# Patient Record
Sex: Female | Born: 1955
Health system: Southern US, Community
[De-identification: ages and names within clinical notes are randomized; demographics above are authoritative.]

## PROBLEM LIST (undated history)

## (undated) DIAGNOSIS — N390 Urinary tract infection, site not specified: Secondary | ICD-10-CM

## (undated) DIAGNOSIS — E039 Hypothyroidism, unspecified: Secondary | ICD-10-CM

## (undated) DIAGNOSIS — T462X1A Poisoning by other antidysrhythmic drugs, accidental (unintentional), initial encounter: Secondary | ICD-10-CM

## (undated) DIAGNOSIS — G9340 Encephalopathy, unspecified: Secondary | ICD-10-CM

## (undated) DIAGNOSIS — F419 Anxiety disorder, unspecified: Secondary | ICD-10-CM

## (undated) DIAGNOSIS — R188 Other ascites: Secondary | ICD-10-CM

## (undated) DIAGNOSIS — R55 Syncope and collapse: Secondary | ICD-10-CM

## (undated) DIAGNOSIS — K7460 Unspecified cirrhosis of liver: Secondary | ICD-10-CM

## (undated) DIAGNOSIS — E109 Type 1 diabetes mellitus without complications: Secondary | ICD-10-CM

## (undated) DIAGNOSIS — A0472 Enterocolitis due to Clostridium difficile, not specified as recurrent: Secondary | ICD-10-CM

## (undated) DIAGNOSIS — N201 Calculus of ureter: Secondary | ICD-10-CM

## (undated) DIAGNOSIS — I209 Angina pectoris, unspecified: Secondary | ICD-10-CM

## (undated) DIAGNOSIS — K567 Ileus, unspecified: Secondary | ICD-10-CM

## (undated) DIAGNOSIS — Z8719 Personal history of other diseases of the digestive system: Secondary | ICD-10-CM

## (undated) DIAGNOSIS — K561 Intussusception: Secondary | ICD-10-CM

## (undated) DIAGNOSIS — E032 Hypothyroidism due to medicaments and other exogenous substances: Secondary | ICD-10-CM

## (undated) DIAGNOSIS — I495 Sick sinus syndrome: Secondary | ICD-10-CM

## (undated) DIAGNOSIS — I480 Paroxysmal atrial fibrillation: Secondary | ICD-10-CM

## (undated) DIAGNOSIS — T7840XA Allergy, unspecified, initial encounter: Secondary | ICD-10-CM

## (undated) DIAGNOSIS — Z8542 Personal history of malignant neoplasm of other parts of uterus: Secondary | ICD-10-CM

## (undated) DIAGNOSIS — I10 Essential (primary) hypertension: Secondary | ICD-10-CM

## (undated) DIAGNOSIS — J189 Pneumonia, unspecified organism: Secondary | ICD-10-CM

## (undated) DIAGNOSIS — N189 Chronic kidney disease, unspecified: Secondary | ICD-10-CM

## (undated) DIAGNOSIS — A419 Sepsis, unspecified organism: Secondary | ICD-10-CM

## (undated) DIAGNOSIS — K219 Gastro-esophageal reflux disease without esophagitis: Secondary | ICD-10-CM

## (undated) DIAGNOSIS — G459 Transient cerebral ischemic attack, unspecified: Secondary | ICD-10-CM

## (undated) DIAGNOSIS — K259 Gastric ulcer, unspecified as acute or chronic, without hemorrhage or perforation: Secondary | ICD-10-CM

## (undated) DIAGNOSIS — I639 Cerebral infarction, unspecified: Secondary | ICD-10-CM

## (undated) DIAGNOSIS — IMO0002 Reserved for concepts with insufficient information to code with codable children: Secondary | ICD-10-CM

## (undated) DIAGNOSIS — K859 Acute pancreatitis without necrosis or infection, unspecified: Secondary | ICD-10-CM

## (undated) DIAGNOSIS — K3184 Gastroparesis: Secondary | ICD-10-CM

## (undated) DIAGNOSIS — C801 Malignant (primary) neoplasm, unspecified: Secondary | ICD-10-CM

## (undated) DIAGNOSIS — K5792 Diverticulitis of intestine, part unspecified, without perforation or abscess without bleeding: Secondary | ICD-10-CM

## (undated) DIAGNOSIS — I951 Orthostatic hypotension: Secondary | ICD-10-CM

## (undated) DIAGNOSIS — R000 Tachycardia, unspecified: Secondary | ICD-10-CM

## (undated) HISTORY — DX: Enterocolitis due to Clostridium difficile, not specified as recurrent: A04.72

## (undated) HISTORY — DX: Paroxysmal atrial fibrillation: I48.0

## (undated) HISTORY — PX: ABDOMINAL HYSTERECTOMY: SHX81

## (undated) HISTORY — DX: Chronic kidney disease, unspecified: N18.9

## (undated) HISTORY — DX: Encephalopathy, unspecified: G93.40

## (undated) HISTORY — DX: Calculus of ureter: N20.1

## (undated) HISTORY — DX: Urinary tract infection, site not specified: N39.0

## (undated) HISTORY — DX: Orthostatic hypotension: I95.1

## (undated) HISTORY — DX: Pneumonia, unspecified organism: J18.9

## (undated) HISTORY — DX: Poisoning by other antidysrhythmic drugs, accidental (unintentional), initial encounter: T46.2X1A

## (undated) HISTORY — DX: Ileus, unspecified: K56.7

## (undated) HISTORY — PX: CHOLECYSTECTOMY: SHX55

## (undated) HISTORY — DX: Personal history of malignant neoplasm of other parts of uterus: Z85.42

## (undated) HISTORY — DX: Poisoning by other antidysrhythmic drugs, accidental (unintentional), initial encounter: E03.2

## (undated) HISTORY — DX: Other ascites: R18.8

## (undated) HISTORY — DX: Allergy, unspecified, initial encounter: T78.40XA

## (undated) HISTORY — DX: Unspecified cirrhosis of liver: K74.60

## (undated) HISTORY — DX: Tachycardia, unspecified: R00.0

## (undated) HISTORY — PX: HERNIA REPAIR: SHX51

---
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
## 2003-12-10 ENCOUNTER — Encounter (INDEPENDENT_AMBULATORY_CARE_PROVIDER_SITE_OTHER): Payer: Self-pay | Admitting: Internal Medicine

## 2003-12-17 ENCOUNTER — Other Ambulatory Visit: Admission: RE | Admit: 2003-12-17 | Discharge: 2003-12-17 | Payer: Self-pay | Admitting: Family Medicine

## 2003-12-21 ENCOUNTER — Encounter: Admission: RE | Admit: 2003-12-21 | Discharge: 2003-12-21 | Payer: Self-pay | Admitting: *Deleted

## 2003-12-28 ENCOUNTER — Encounter: Admission: RE | Admit: 2003-12-28 | Discharge: 2003-12-28 | Payer: Self-pay | Admitting: Family Medicine

## 2004-02-09 ENCOUNTER — Encounter (INDEPENDENT_AMBULATORY_CARE_PROVIDER_SITE_OTHER): Payer: Self-pay | Admitting: Internal Medicine

## 2004-02-15 ENCOUNTER — Encounter: Admission: RE | Admit: 2004-02-15 | Discharge: 2004-02-15 | Payer: Self-pay | Admitting: Family Medicine

## 2004-02-21 ENCOUNTER — Encounter
Admission: RE | Admit: 2004-02-21 | Discharge: 2004-05-21 | Payer: Self-pay | Admitting: Physical Medicine & Rehabilitation

## 2004-05-30 ENCOUNTER — Encounter
Admission: RE | Admit: 2004-05-30 | Discharge: 2004-08-28 | Payer: Self-pay | Admitting: Physical Medicine & Rehabilitation

## 2004-07-11 ENCOUNTER — Encounter (INDEPENDENT_AMBULATORY_CARE_PROVIDER_SITE_OTHER): Payer: Self-pay | Admitting: Internal Medicine

## 2004-07-11 LAB — CONVERTED CEMR LAB: Hgb A1c MFr Bld: 5.8 %

## 2004-07-18 ENCOUNTER — Ambulatory Visit: Payer: Self-pay | Admitting: Family Medicine

## 2004-07-19 ENCOUNTER — Ambulatory Visit: Payer: Self-pay | Admitting: Physical Medicine & Rehabilitation

## 2004-07-24 ENCOUNTER — Ambulatory Visit: Payer: Self-pay | Admitting: Physical Medicine & Rehabilitation

## 2004-08-15 ENCOUNTER — Encounter: Admission: RE | Admit: 2004-08-15 | Discharge: 2004-08-15 | Payer: Self-pay | Admitting: Family Medicine

## 2004-09-12 ENCOUNTER — Encounter
Admission: RE | Admit: 2004-09-12 | Discharge: 2004-12-11 | Payer: Self-pay | Admitting: Physical Medicine & Rehabilitation

## 2004-09-14 ENCOUNTER — Ambulatory Visit: Payer: Self-pay | Admitting: Physical Medicine & Rehabilitation

## 2004-10-31 ENCOUNTER — Ambulatory Visit: Payer: Self-pay | Admitting: Physical Medicine & Rehabilitation

## 2004-10-31 ENCOUNTER — Ambulatory Visit: Payer: Self-pay | Admitting: Anesthesiology

## 2004-11-22 ENCOUNTER — Ambulatory Visit: Payer: Self-pay | Admitting: Physical Medicine & Rehabilitation

## 2004-12-05 ENCOUNTER — Emergency Department (HOSPITAL_COMMUNITY): Admission: EM | Admit: 2004-12-05 | Discharge: 2004-12-05 | Payer: Self-pay | Admitting: Emergency Medicine

## 2004-12-09 ENCOUNTER — Encounter (INDEPENDENT_AMBULATORY_CARE_PROVIDER_SITE_OTHER): Payer: Self-pay | Admitting: Internal Medicine

## 2004-12-09 LAB — CONVERTED CEMR LAB: Hgb A1c MFr Bld: 6.7 %

## 2004-12-21 ENCOUNTER — Encounter
Admission: RE | Admit: 2004-12-21 | Discharge: 2005-03-21 | Payer: Self-pay | Admitting: Physical Medicine & Rehabilitation

## 2004-12-27 ENCOUNTER — Emergency Department (HOSPITAL_COMMUNITY): Admission: EM | Admit: 2004-12-27 | Discharge: 2004-12-28 | Payer: Self-pay | Admitting: Emergency Medicine

## 2004-12-31 ENCOUNTER — Emergency Department (HOSPITAL_COMMUNITY): Admission: EM | Admit: 2004-12-31 | Discharge: 2004-12-31 | Payer: Self-pay | Admitting: Emergency Medicine

## 2005-01-02 ENCOUNTER — Ambulatory Visit: Payer: Self-pay | Admitting: Family Medicine

## 2005-01-08 ENCOUNTER — Emergency Department (HOSPITAL_COMMUNITY): Admission: EM | Admit: 2005-01-08 | Discharge: 2005-01-08 | Payer: Self-pay | Admitting: *Deleted

## 2005-01-22 ENCOUNTER — Ambulatory Visit: Payer: Self-pay | Admitting: Physical Medicine & Rehabilitation

## 2005-02-20 ENCOUNTER — Ambulatory Visit: Payer: Self-pay | Admitting: Family Medicine

## 2005-03-07 ENCOUNTER — Ambulatory Visit: Payer: Self-pay | Admitting: Physical Medicine & Rehabilitation

## 2005-04-05 ENCOUNTER — Inpatient Hospital Stay (HOSPITAL_COMMUNITY): Admission: EM | Admit: 2005-04-05 | Discharge: 2005-04-13 | Payer: Self-pay | Admitting: Emergency Medicine

## 2005-04-07 ENCOUNTER — Ambulatory Visit: Payer: Self-pay | Admitting: Internal Medicine

## 2005-04-14 ENCOUNTER — Inpatient Hospital Stay (HOSPITAL_COMMUNITY): Admission: EM | Admit: 2005-04-14 | Discharge: 2005-04-25 | Payer: Self-pay | Admitting: Emergency Medicine

## 2005-04-30 ENCOUNTER — Ambulatory Visit: Payer: Self-pay | Admitting: Family Medicine

## 2005-05-03 ENCOUNTER — Inpatient Hospital Stay (HOSPITAL_COMMUNITY): Admission: EM | Admit: 2005-05-03 | Discharge: 2005-05-15 | Payer: Self-pay | Admitting: *Deleted

## 2005-05-03 ENCOUNTER — Ambulatory Visit: Payer: Self-pay | Admitting: Internal Medicine

## 2005-05-15 ENCOUNTER — Ambulatory Visit: Payer: Self-pay | Admitting: Internal Medicine

## 2005-05-25 ENCOUNTER — Ambulatory Visit: Payer: Self-pay | Admitting: Family Medicine

## 2005-05-28 ENCOUNTER — Ambulatory Visit: Payer: Self-pay | Admitting: Family Medicine

## 2005-06-01 ENCOUNTER — Ambulatory Visit: Payer: Self-pay | Admitting: Internal Medicine

## 2005-06-08 ENCOUNTER — Ambulatory Visit: Payer: Self-pay | Admitting: Family Medicine

## 2005-06-11 ENCOUNTER — Ambulatory Visit: Payer: Self-pay | Admitting: Family Medicine

## 2005-06-13 ENCOUNTER — Encounter: Admission: RE | Admit: 2005-06-13 | Discharge: 2005-06-13 | Payer: Self-pay | Admitting: Family Medicine

## 2005-06-18 ENCOUNTER — Ambulatory Visit: Payer: Self-pay | Admitting: Family Medicine

## 2005-06-27 ENCOUNTER — Ambulatory Visit: Payer: Self-pay | Admitting: Family Medicine

## 2005-06-29 ENCOUNTER — Ambulatory Visit: Payer: Self-pay | Admitting: Internal Medicine

## 2005-07-18 ENCOUNTER — Ambulatory Visit: Payer: Self-pay | Admitting: Physical Medicine & Rehabilitation

## 2005-07-18 ENCOUNTER — Encounter
Admission: RE | Admit: 2005-07-18 | Discharge: 2005-10-16 | Payer: Self-pay | Admitting: Physical Medicine & Rehabilitation

## 2005-07-19 ENCOUNTER — Encounter (INDEPENDENT_AMBULATORY_CARE_PROVIDER_SITE_OTHER): Payer: Self-pay | Admitting: Internal Medicine

## 2005-09-05 ENCOUNTER — Inpatient Hospital Stay (HOSPITAL_COMMUNITY): Admission: EM | Admit: 2005-09-05 | Discharge: 2005-09-07 | Payer: Self-pay | Admitting: Emergency Medicine

## 2005-09-05 ENCOUNTER — Ambulatory Visit: Payer: Self-pay | Admitting: Internal Medicine

## 2005-09-06 ENCOUNTER — Ambulatory Visit: Payer: Self-pay | Admitting: Gastroenterology

## 2005-09-28 ENCOUNTER — Ambulatory Visit: Payer: Self-pay | Admitting: Physical Medicine & Rehabilitation

## 2005-09-28 ENCOUNTER — Ambulatory Visit: Payer: Self-pay | Admitting: Internal Medicine

## 2005-10-11 ENCOUNTER — Encounter (INDEPENDENT_AMBULATORY_CARE_PROVIDER_SITE_OTHER): Payer: Self-pay | Admitting: Internal Medicine

## 2005-10-11 LAB — CONVERTED CEMR LAB: Hgb A1c MFr Bld: 6.8 %

## 2005-10-25 ENCOUNTER — Encounter
Admission: RE | Admit: 2005-10-25 | Discharge: 2006-01-23 | Payer: Self-pay | Admitting: Physical Medicine & Rehabilitation

## 2005-11-02 ENCOUNTER — Ambulatory Visit: Payer: Self-pay | Admitting: Family Medicine

## 2005-11-08 ENCOUNTER — Ambulatory Visit: Payer: Self-pay | Admitting: Family Medicine

## 2005-11-09 ENCOUNTER — Ambulatory Visit: Payer: Self-pay | Admitting: Family Medicine

## 2005-11-13 IMAGING — CR DG LUMBAR SPINE 2-3V
3 series · 3 of 3 positions shown · non-contrast
Comparison: none

CLINICAL DATA: Back pain. 
 LUMBAR SPINE ? (TWO ? THREE VIEWS)
 There are five lumbar type vertebra present.  There is a mild scoliosis with the convexity of the scoliosis to the left in the mid lumbar region.  The interspaces are adequately maintained.  There are no fractures, subluxations, or destructive changes. 
 IMPRESSION
 Mild scoliosis.

[view not recorded (1 of 3)]
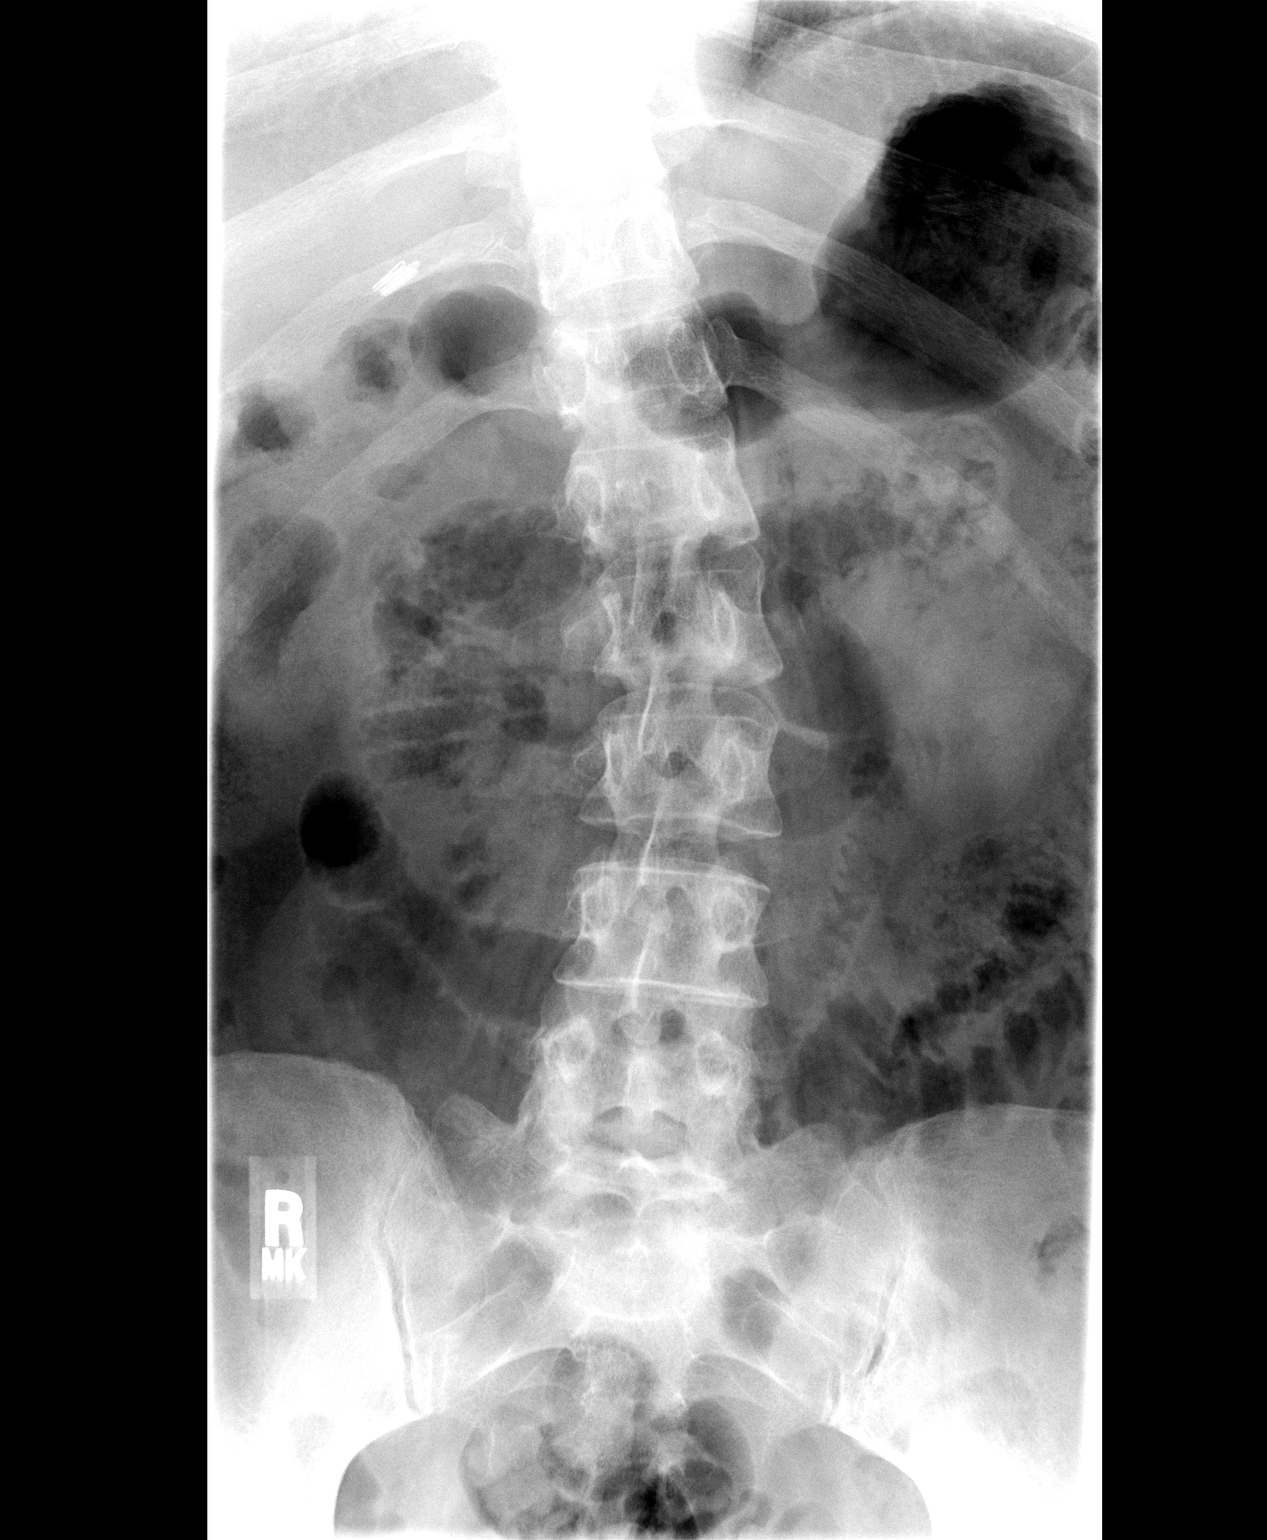

[view not recorded (2 of 3)]
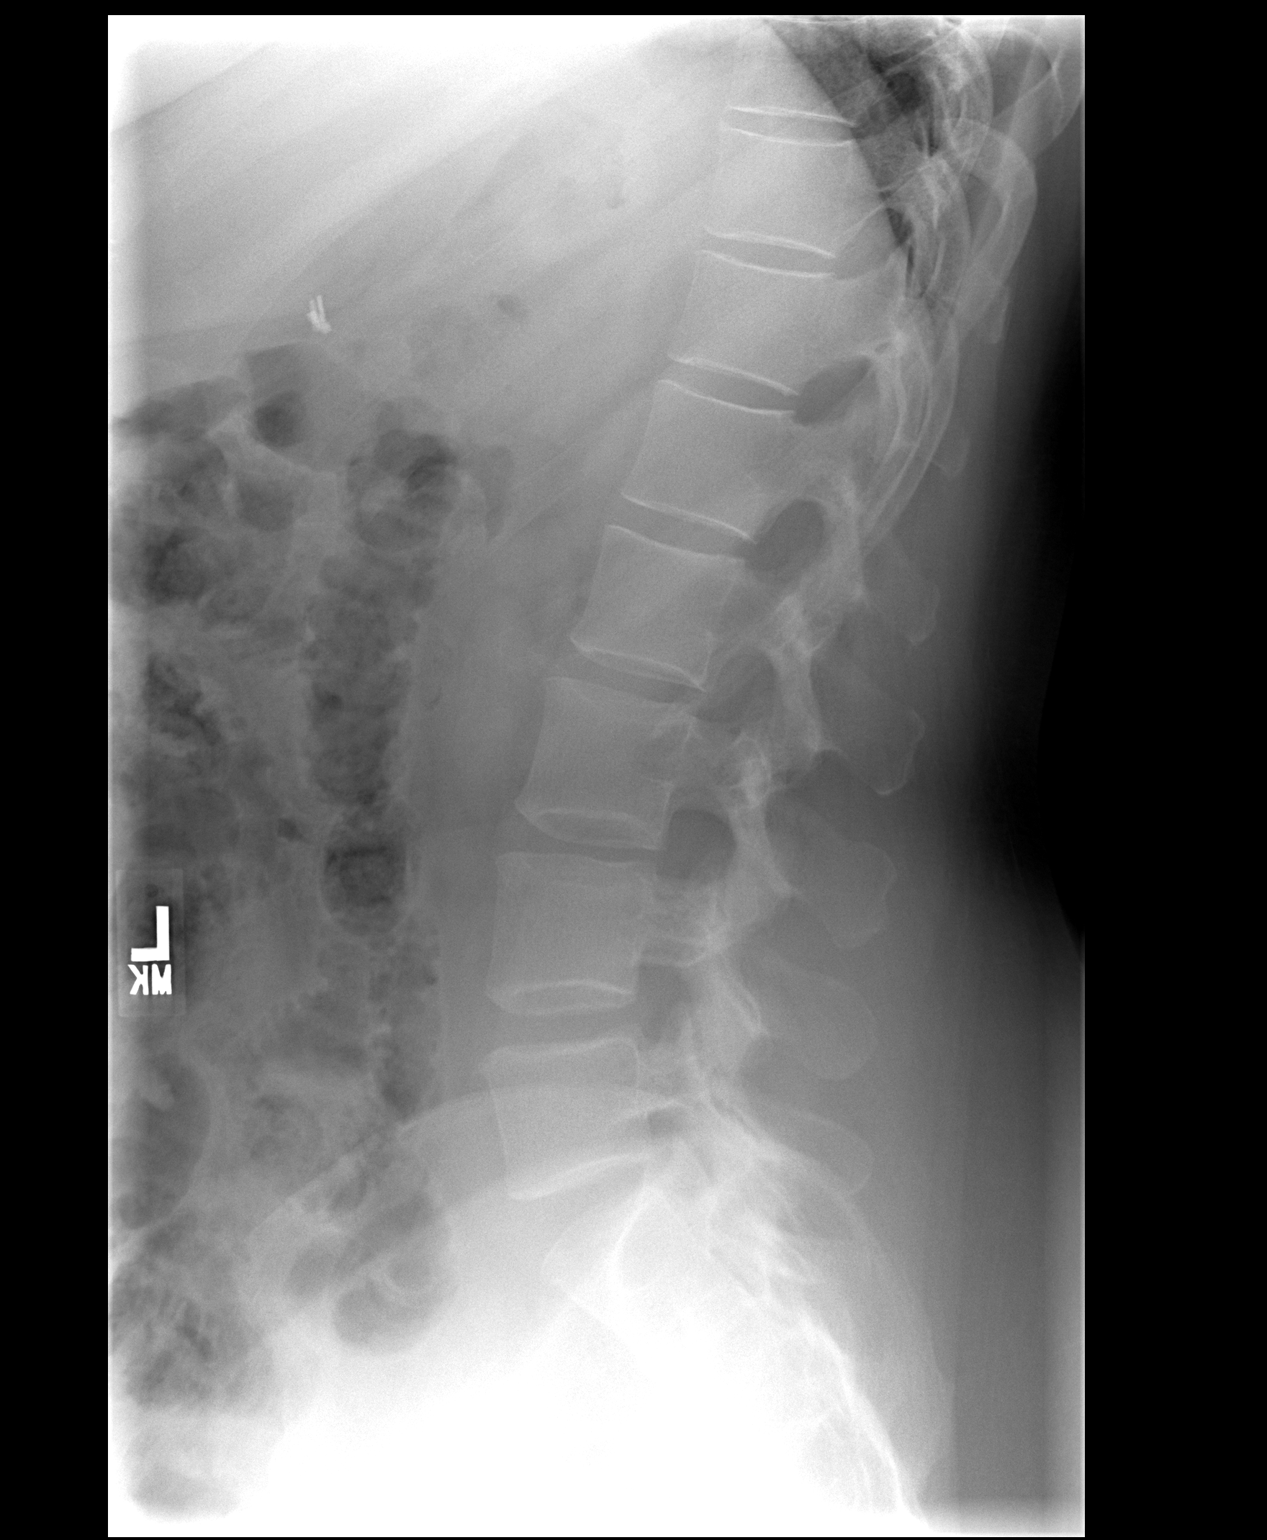

[view not recorded (3 of 3)]
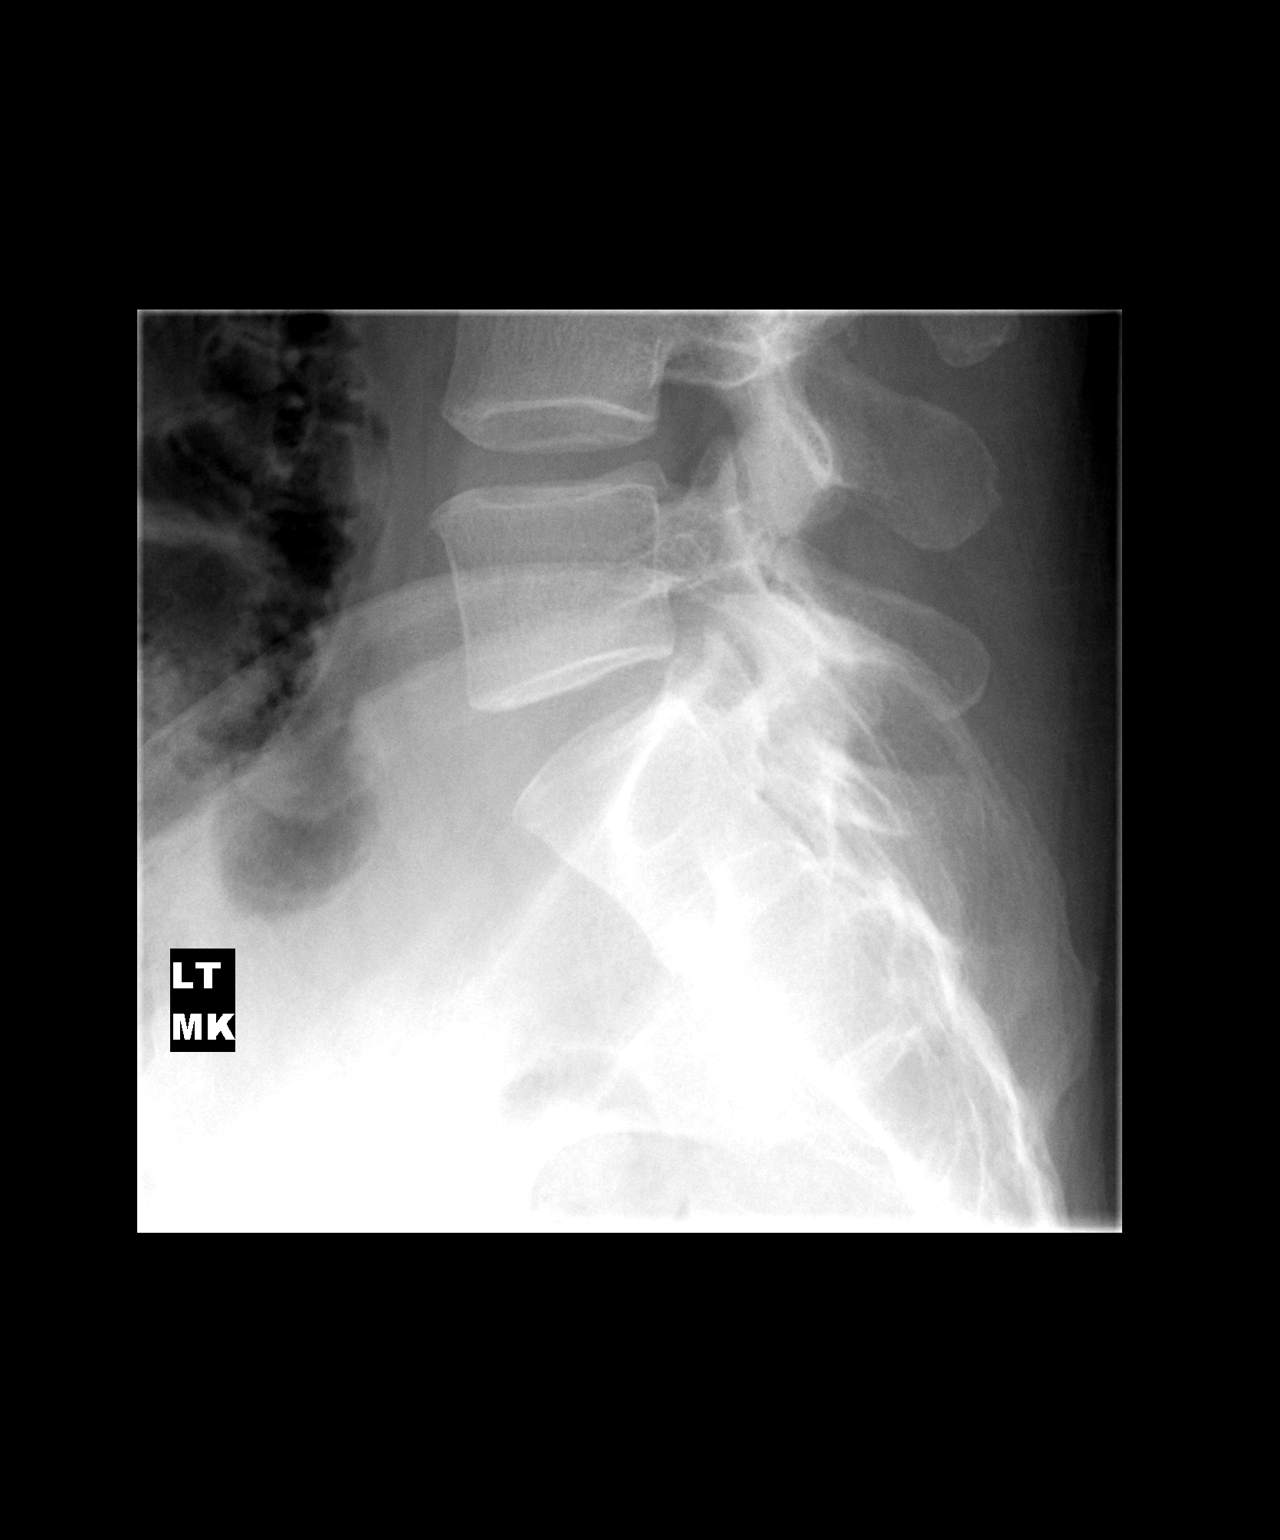

[3 of 3 positions shown; findings below may reference images not displayed]

## 2005-11-21 ENCOUNTER — Ambulatory Visit: Payer: Self-pay | Admitting: Physical Medicine & Rehabilitation

## 2005-12-21 ENCOUNTER — Ambulatory Visit: Payer: Self-pay | Admitting: Family Medicine

## 2006-01-11 ENCOUNTER — Ambulatory Visit: Payer: Self-pay | Admitting: Physical Medicine & Rehabilitation

## 2006-02-02 ENCOUNTER — Emergency Department (HOSPITAL_COMMUNITY): Admission: EM | Admit: 2006-02-02 | Discharge: 2006-02-03 | Payer: Self-pay | Admitting: Emergency Medicine

## 2006-02-07 ENCOUNTER — Encounter
Admission: RE | Admit: 2006-02-07 | Discharge: 2006-05-08 | Payer: Self-pay | Admitting: Physical Medicine & Rehabilitation

## 2006-02-22 ENCOUNTER — Ambulatory Visit: Payer: Self-pay | Admitting: Physical Medicine & Rehabilitation

## 2006-02-28 ENCOUNTER — Encounter
Admission: RE | Admit: 2006-02-28 | Discharge: 2006-02-28 | Payer: Self-pay | Admitting: Physical Medicine & Rehabilitation

## 2006-02-28 ENCOUNTER — Ambulatory Visit: Payer: Self-pay | Admitting: Family Medicine

## 2006-03-06 ENCOUNTER — Ambulatory Visit: Payer: Self-pay | Admitting: Family Medicine

## 2006-03-10 ENCOUNTER — Encounter (INDEPENDENT_AMBULATORY_CARE_PROVIDER_SITE_OTHER): Payer: Self-pay | Admitting: Internal Medicine

## 2006-03-10 LAB — CONVERTED CEMR LAB: Hgb A1c MFr Bld: 7.4 %

## 2006-03-14 ENCOUNTER — Ambulatory Visit: Payer: Self-pay | Admitting: Family Medicine

## 2006-03-21 ENCOUNTER — Encounter: Payer: Self-pay | Admitting: Emergency Medicine

## 2006-03-21 ENCOUNTER — Inpatient Hospital Stay (HOSPITAL_COMMUNITY): Admission: AD | Admit: 2006-03-21 | Discharge: 2006-03-23 | Payer: Self-pay | Admitting: Internal Medicine

## 2006-03-21 ENCOUNTER — Ambulatory Visit: Payer: Self-pay | Admitting: Internal Medicine

## 2006-03-25 ENCOUNTER — Ambulatory Visit: Payer: Self-pay | Admitting: Gastroenterology

## 2006-04-09 ENCOUNTER — Ambulatory Visit: Payer: Self-pay | Admitting: Family Medicine

## 2006-04-11 ENCOUNTER — Emergency Department (HOSPITAL_COMMUNITY): Admission: EM | Admit: 2006-04-11 | Discharge: 2006-04-12 | Payer: Self-pay | Admitting: Emergency Medicine

## 2006-04-15 ENCOUNTER — Emergency Department (HOSPITAL_COMMUNITY): Admission: EM | Admit: 2006-04-15 | Discharge: 2006-04-15 | Payer: Self-pay | Admitting: Emergency Medicine

## 2006-04-17 ENCOUNTER — Inpatient Hospital Stay (HOSPITAL_COMMUNITY): Admission: EM | Admit: 2006-04-17 | Discharge: 2006-04-22 | Payer: Self-pay | Admitting: Emergency Medicine

## 2006-05-02 ENCOUNTER — Ambulatory Visit: Payer: Self-pay | Admitting: Internal Medicine

## 2006-05-02 ENCOUNTER — Ambulatory Visit: Payer: Self-pay | Admitting: Family Medicine

## 2006-05-16 ENCOUNTER — Encounter: Payer: Self-pay | Admitting: Emergency Medicine

## 2006-05-17 ENCOUNTER — Inpatient Hospital Stay (HOSPITAL_COMMUNITY): Admission: EM | Admit: 2006-05-17 | Discharge: 2006-05-20 | Payer: Self-pay | Admitting: Internal Medicine

## 2006-05-17 ENCOUNTER — Ambulatory Visit: Payer: Self-pay | Admitting: Internal Medicine

## 2006-05-24 ENCOUNTER — Ambulatory Visit: Payer: Self-pay | Admitting: Internal Medicine

## 2006-05-25 ENCOUNTER — Inpatient Hospital Stay (HOSPITAL_COMMUNITY): Admission: EM | Admit: 2006-05-25 | Discharge: 2006-05-29 | Payer: Self-pay | Admitting: Emergency Medicine

## 2006-05-25 ENCOUNTER — Ambulatory Visit: Payer: Self-pay | Admitting: Internal Medicine

## 2006-05-29 ENCOUNTER — Ambulatory Visit: Payer: Self-pay | Admitting: Internal Medicine

## 2006-05-30 ENCOUNTER — Encounter
Admission: RE | Admit: 2006-05-30 | Discharge: 2006-08-28 | Payer: Self-pay | Admitting: Physical Medicine & Rehabilitation

## 2006-05-30 ENCOUNTER — Ambulatory Visit: Payer: Self-pay | Admitting: Physical Medicine & Rehabilitation

## 2006-06-04 ENCOUNTER — Ambulatory Visit: Payer: Self-pay | Admitting: Family Medicine

## 2006-06-05 ENCOUNTER — Ambulatory Visit: Payer: Self-pay | Admitting: Internal Medicine

## 2006-06-12 ENCOUNTER — Ambulatory Visit: Payer: Self-pay | Admitting: Internal Medicine

## 2006-06-20 ENCOUNTER — Inpatient Hospital Stay (HOSPITAL_COMMUNITY): Admission: EM | Admit: 2006-06-20 | Discharge: 2006-06-27 | Payer: Self-pay | Admitting: Emergency Medicine

## 2006-07-05 ENCOUNTER — Ambulatory Visit: Payer: Self-pay | Admitting: Physical Medicine & Rehabilitation

## 2006-07-23 ENCOUNTER — Ambulatory Visit: Payer: Self-pay | Admitting: Family Medicine

## 2006-08-14 ENCOUNTER — Ambulatory Visit: Payer: Self-pay | Admitting: Orthopedic Surgery

## 2006-08-23 ENCOUNTER — Ambulatory Visit: Payer: Self-pay | Admitting: Internal Medicine

## 2006-08-23 ENCOUNTER — Inpatient Hospital Stay (HOSPITAL_COMMUNITY): Admission: EM | Admit: 2006-08-23 | Discharge: 2006-08-27 | Payer: Self-pay | Admitting: Emergency Medicine

## 2006-08-27 ENCOUNTER — Ambulatory Visit: Payer: Self-pay | Admitting: Physical Medicine & Rehabilitation

## 2006-08-27 ENCOUNTER — Encounter
Admission: RE | Admit: 2006-08-27 | Discharge: 2006-11-25 | Payer: Self-pay | Admitting: Physical Medicine & Rehabilitation

## 2006-09-12 ENCOUNTER — Encounter (INDEPENDENT_AMBULATORY_CARE_PROVIDER_SITE_OTHER): Payer: Self-pay | Admitting: Internal Medicine

## 2006-09-12 ENCOUNTER — Ambulatory Visit: Payer: Self-pay | Admitting: Family Medicine

## 2006-09-12 ENCOUNTER — Encounter: Admission: RE | Admit: 2006-09-12 | Discharge: 2006-09-12 | Payer: Self-pay | Admitting: Family Medicine

## 2006-10-23 ENCOUNTER — Ambulatory Visit: Payer: Self-pay | Admitting: Physical Medicine & Rehabilitation

## 2006-10-29 ENCOUNTER — Ambulatory Visit: Payer: Self-pay | Admitting: Internal Medicine

## 2006-10-29 IMAGING — MR MR LUMBAR SPINE W/O CM
4 of 7 series · 19 of 48 positions shown · IV contrast (agent unspecified)
Comparison: none

CLINICAL DATA: Low back pain.
 MRI OF THE LUMBAR SPINE WITHOUT CONTRAST:
 Multiplanar T1 and T2 weighted imaging was performed.  There is mild scoliosis of the spine convex to the left.  Intervertebral discs are normal at L3-4 and above.  The canal and foramina are widely patent.  The distal cord and conus are normal.  
 At L4-5, there is minimal desiccation and bulging of the disc, but no herniation or stenosis. 
 At L5-S1, there is minimal desiccation of the disc, but no bulge or herniation.  There are minimal facet degenerative changes at the lower two levels.

[Series 2: T1 · sagittal · 4.0mm · 0.51mm/px · 3 of 12 slices shown]
[im 1/12]
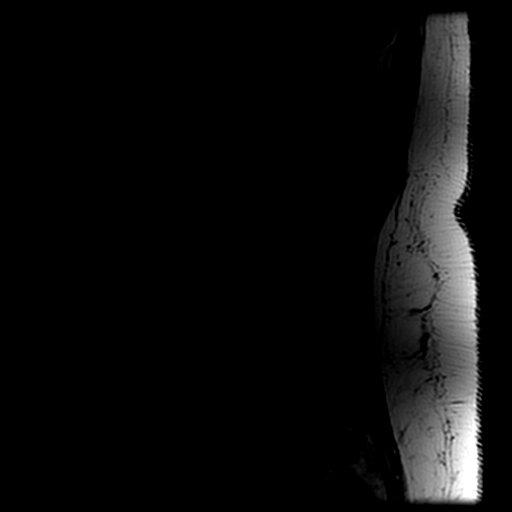
[im 6/12]
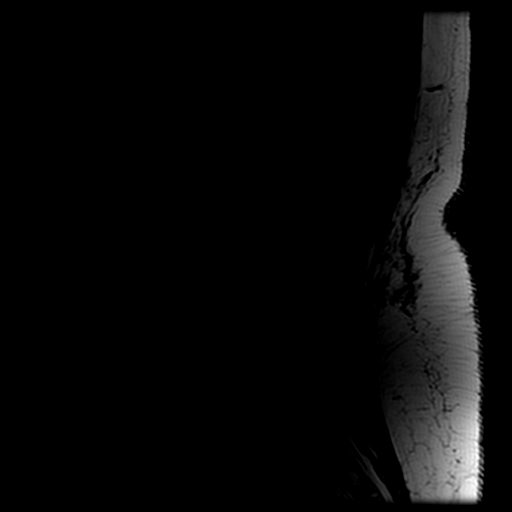
[im 12/12]
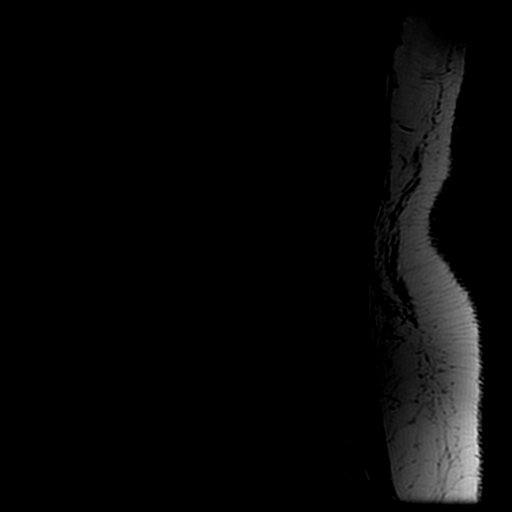

[Series 4: T2 · sagittal · 4.0mm · 0.51mm/px · 4 of 12 slices shown (1 of 2)]
[im 1/12]
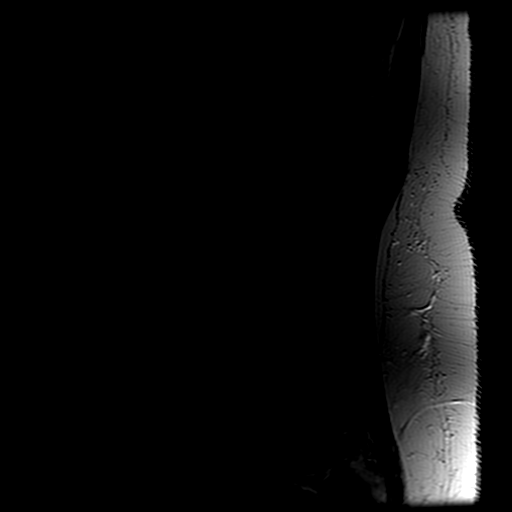
[im 4/12]
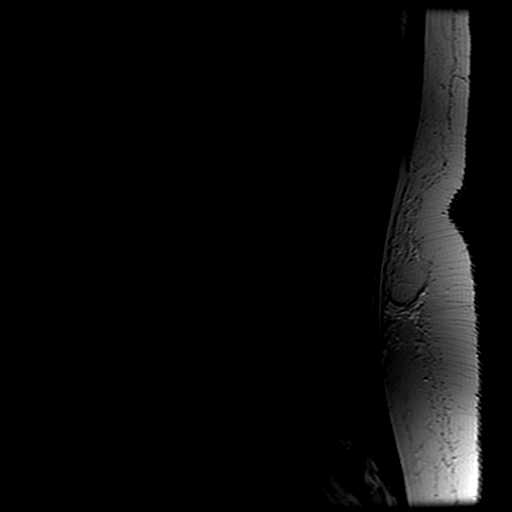
[im 8/12]
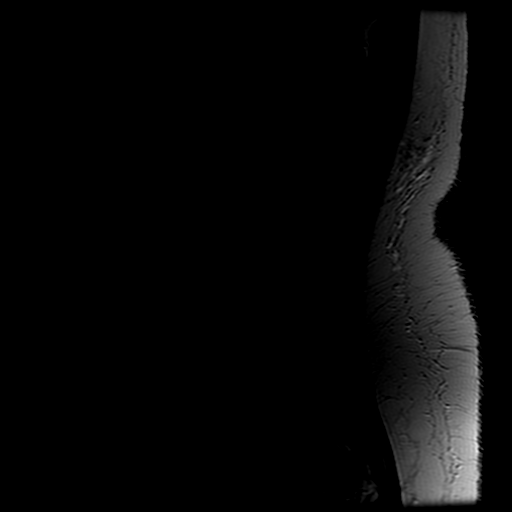
[im 12/12]
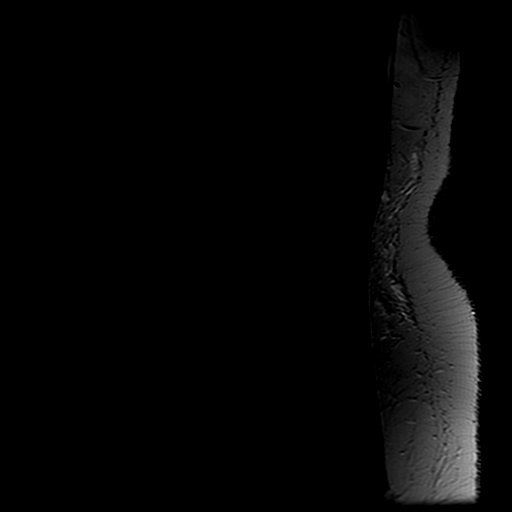

[Series 5: STIR · sagittal · 4.0mm · 0.51mm/px · 3 of 12 slices shown]
[im 1/12]
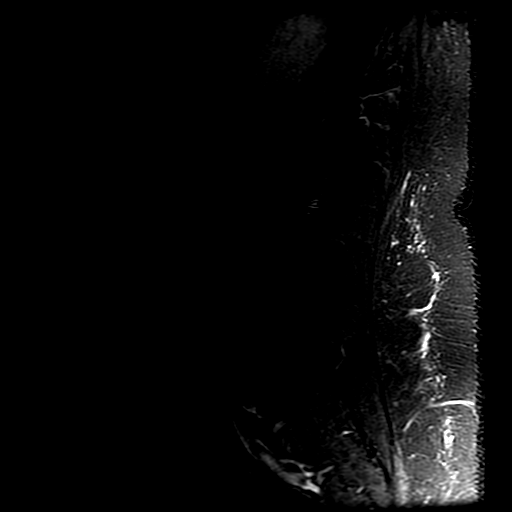
[im 8/12]
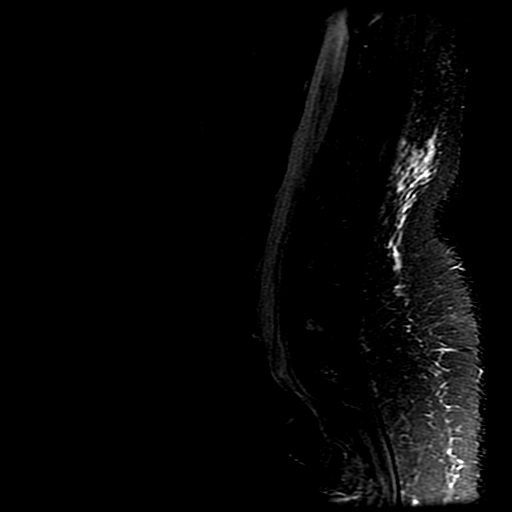
[im 12/12]
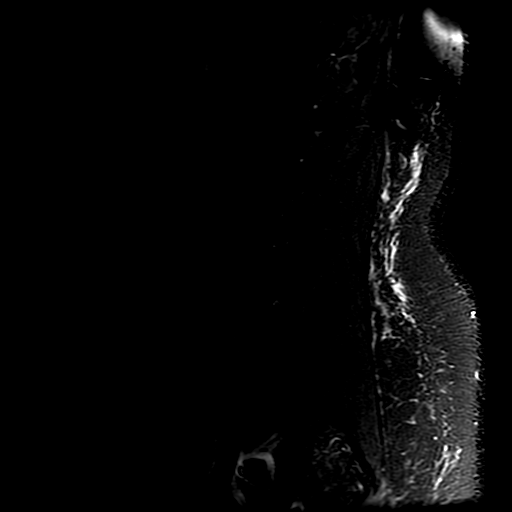

[Series 7: T2 · axial · 4.0mm · 0.35mm/px · z∈[+14,+228]mm · 9 of 24 slices shown (2 of 2)]
[im 1/24]
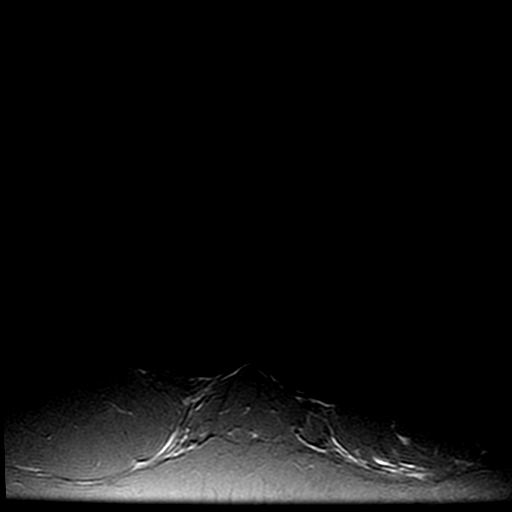
[im 3/24]
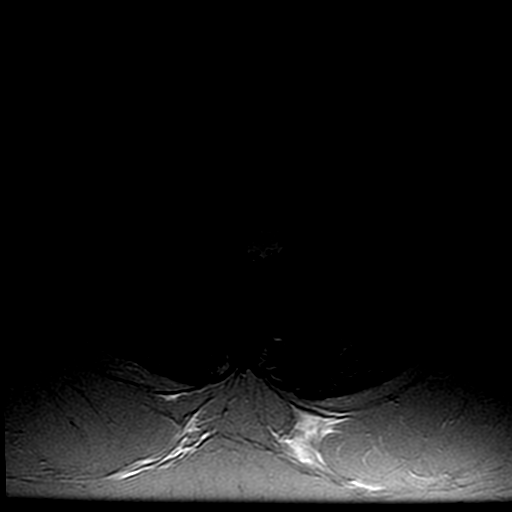
[im 6/24]
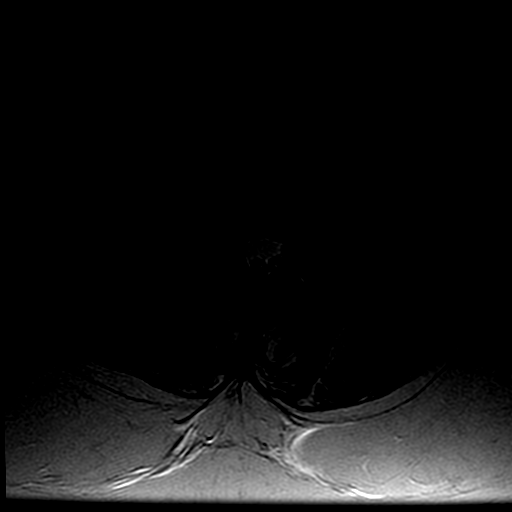
[im 9/24]
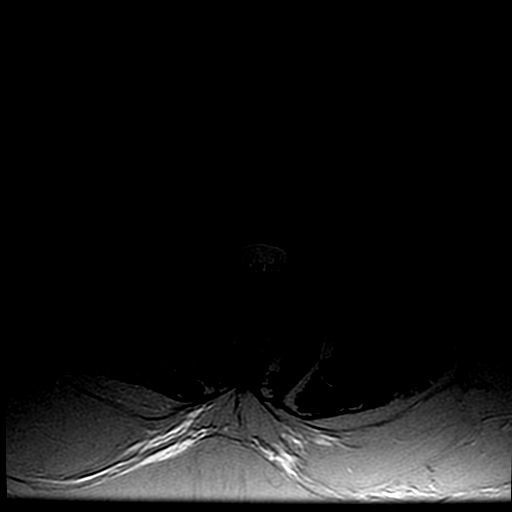
[im 12/24]
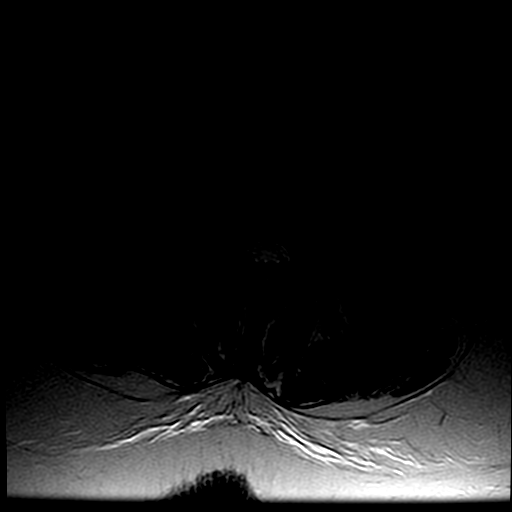
[im 15/24]
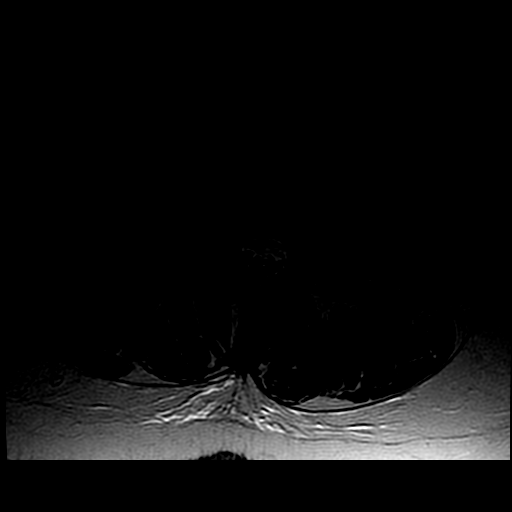
[im 18/24]
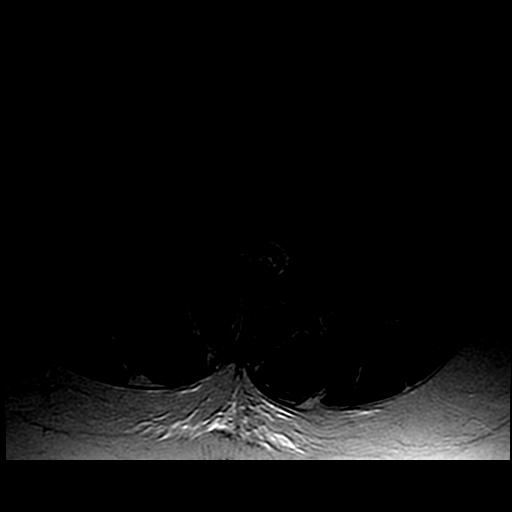
[im 21/24]
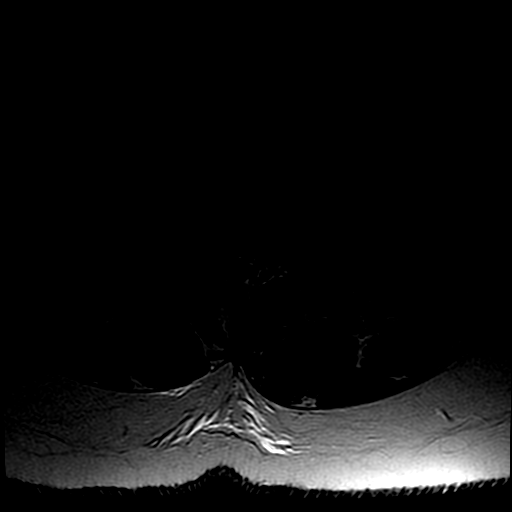
[im 24/24]
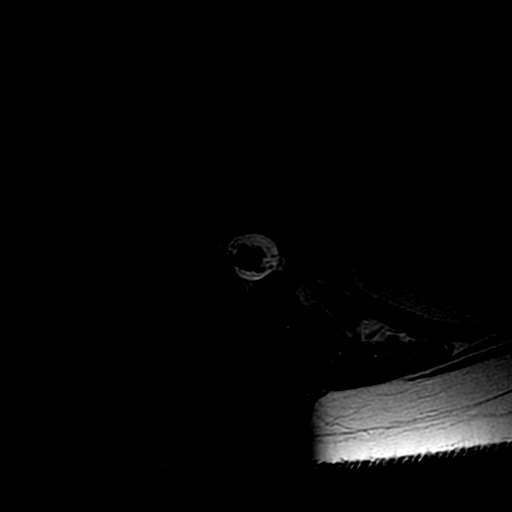

[19 of 48 positions shown; findings below may reference images not displayed]

IMPRESSION: Minimal degenerative disc disease and degenerative facet disease in the lower lumbar spine, less than is often seen in asymptomatic patient?s of this age.  No acute or definitely symptomatic lesion is seen.

## 2006-10-30 ENCOUNTER — Ambulatory Visit: Payer: Self-pay | Admitting: Internal Medicine

## 2006-10-30 ENCOUNTER — Ambulatory Visit: Payer: Self-pay | Admitting: *Deleted

## 2006-11-09 ENCOUNTER — Encounter (INDEPENDENT_AMBULATORY_CARE_PROVIDER_SITE_OTHER): Payer: Self-pay | Admitting: Internal Medicine

## 2006-11-21 IMAGING — CR DG ABDOMEN ACUTE W/ 1V CHEST
3 series · 3 of 3 positions shown · non-contrast
Comparison: none

CLINICAL DATA: Abdominal and pelvic pain. 
 ACUTE ABDOMINAL SERIES WITH CHEST ? 3 VIEWS:

[w chest pa]
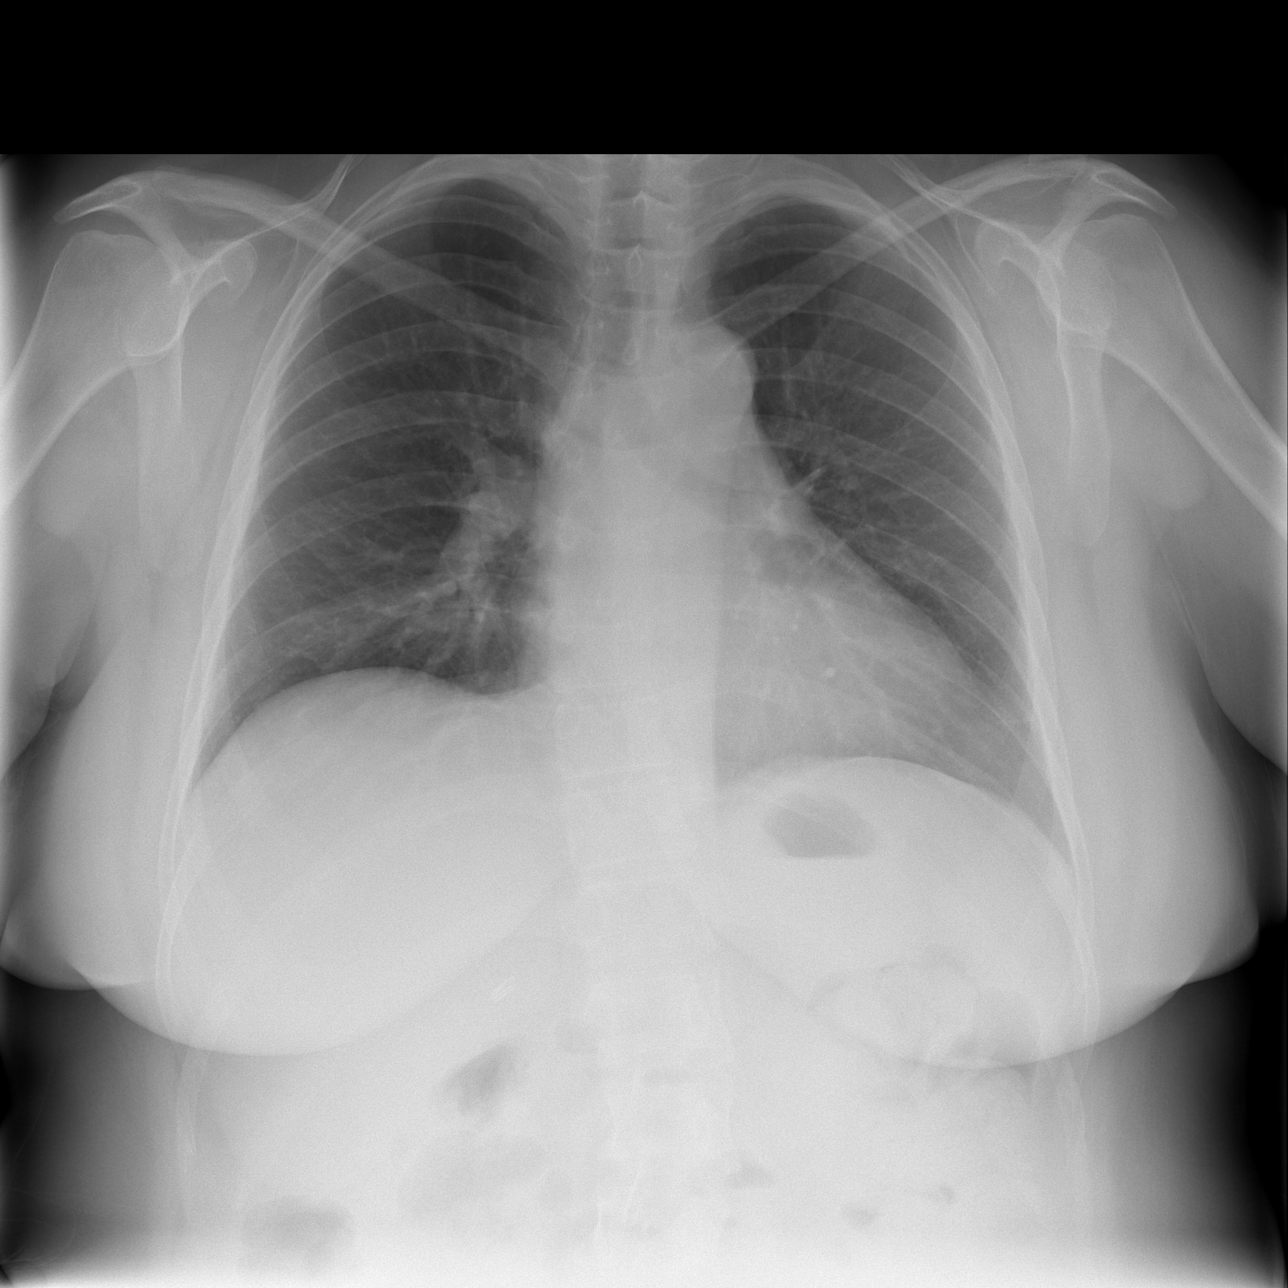

[w abdomen upright *]
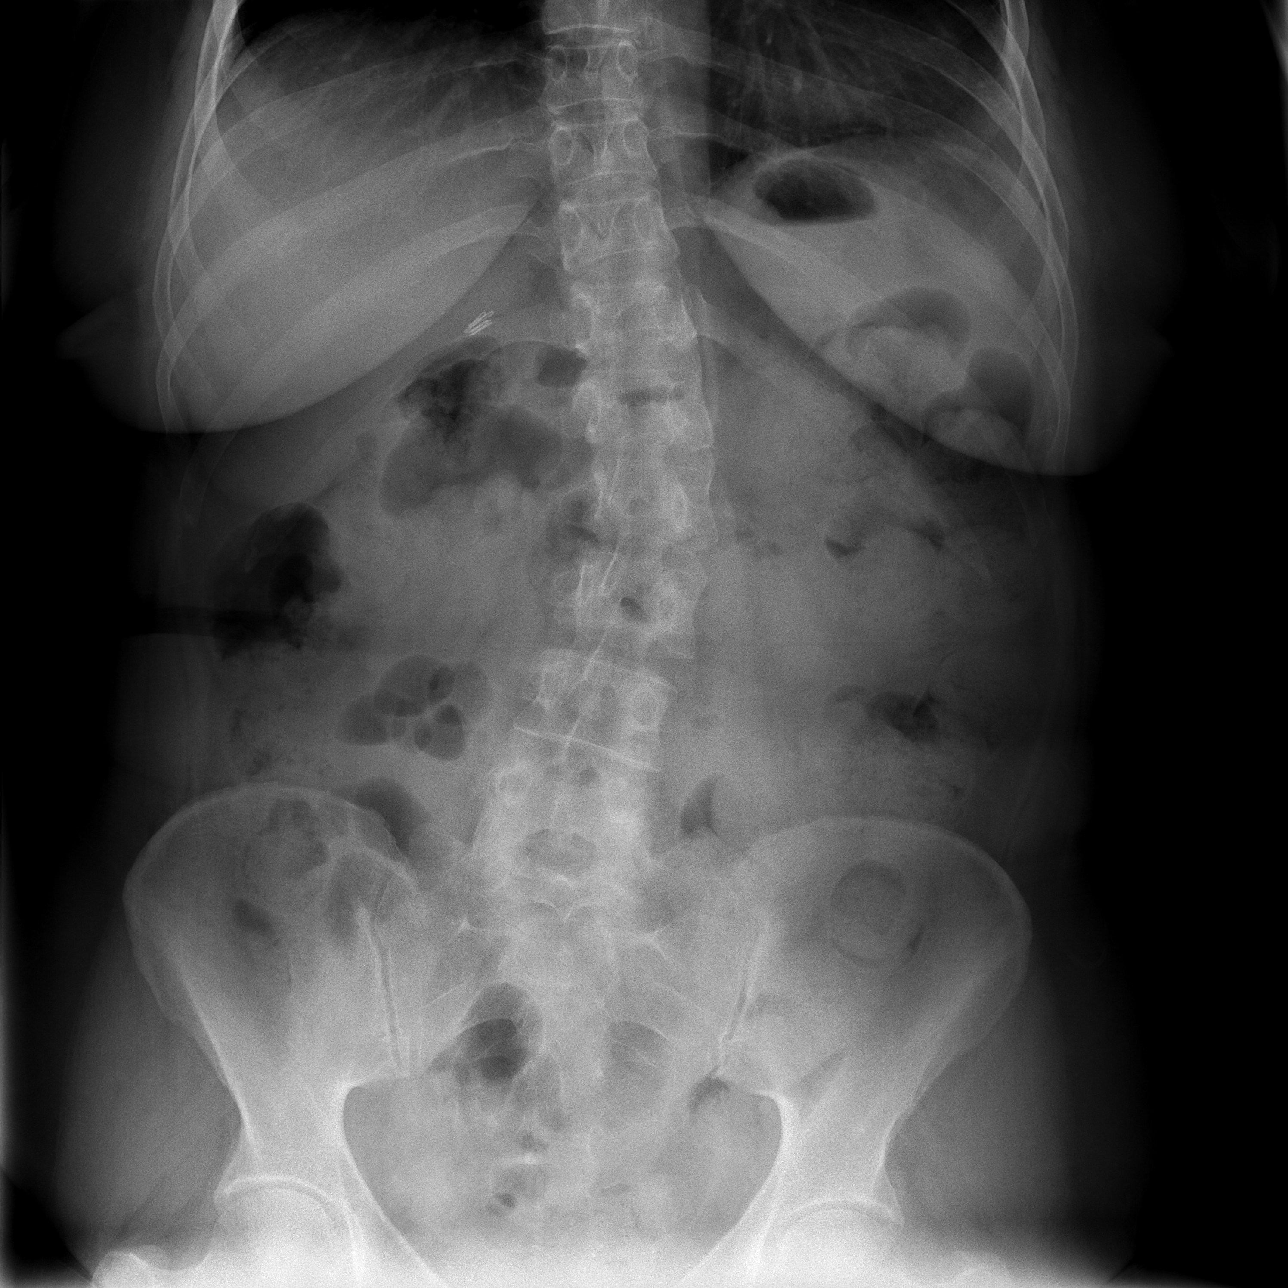

[t abdomen supine]
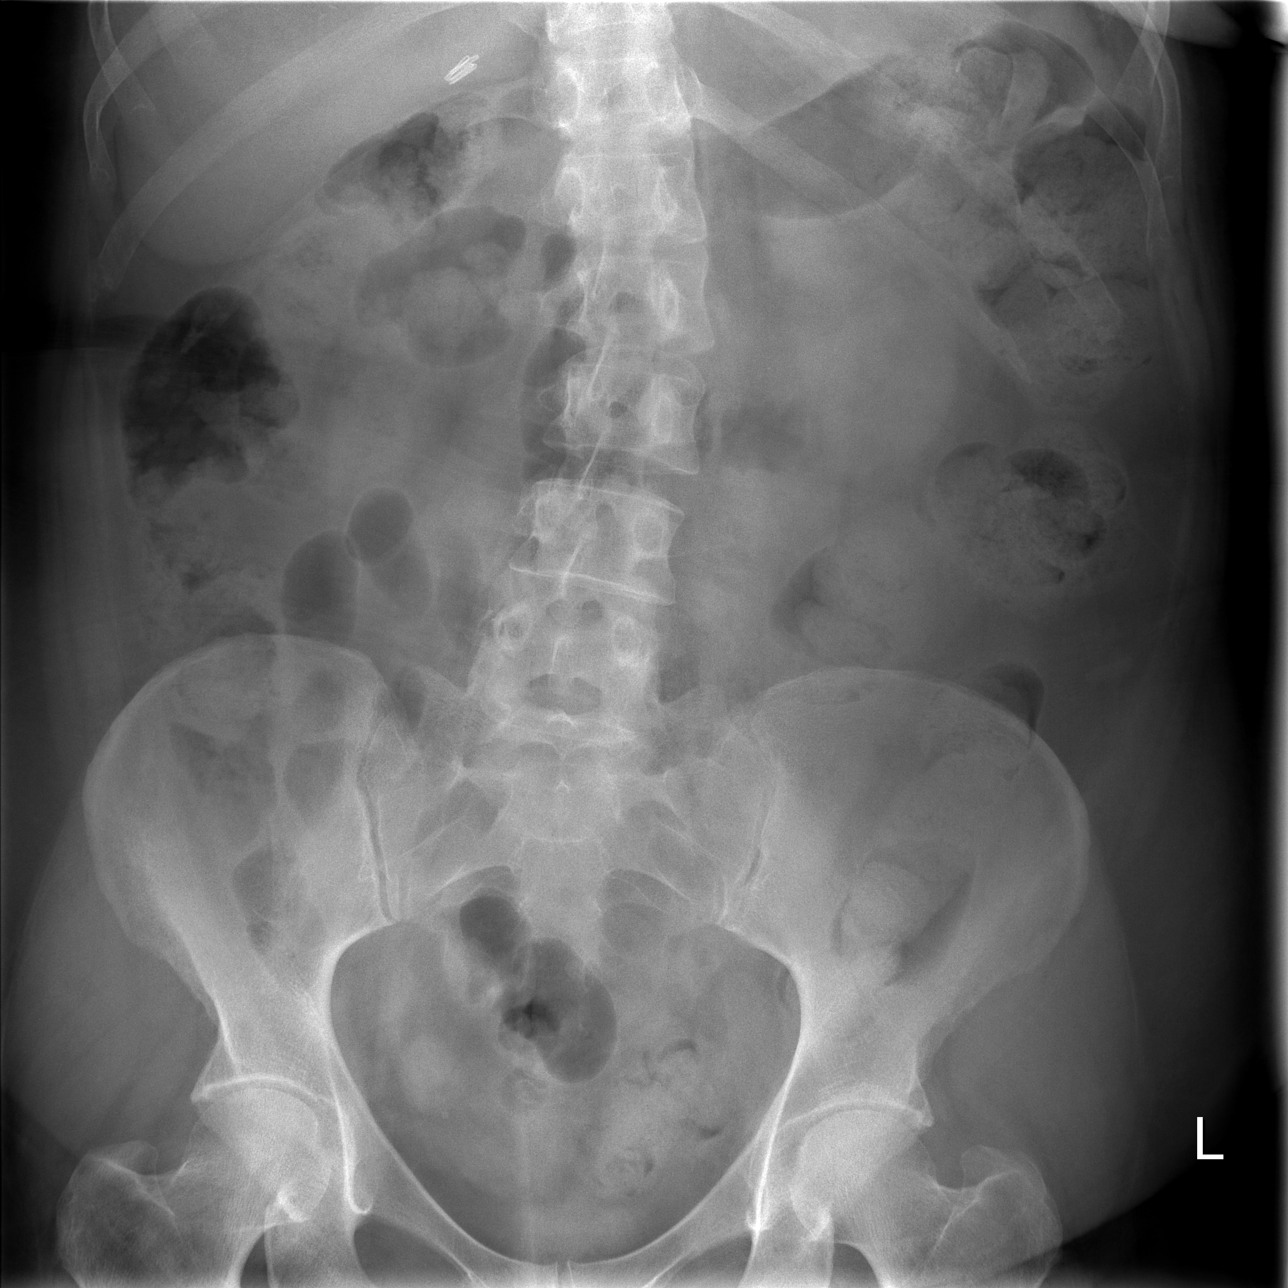

[3 of 3 positions shown; findings below may reference images not displayed]

FINDINGS: Cardiomediastinal silhouette is unremarkable.  Lungs are clear.  Patient is status post cholecystectomy.  Bowel gas pattern is unremarkable without evidence of bowel obstruction or pneumoperitoneum.  Lumbar scoliosis is unchanged.
IMPRESSION: 1.  No acute abnormality.  
 2.  Stable lumbar scoliosis.

## 2006-11-24 IMAGING — CR DG CHEST 1V PORT
1 series · 1 of 1 positions shown · non-contrast
Comparison: none

CLINICAL DATA: Altered level of conscious.  Shortness of breath and cough.
 PORTABLE CHEST, 12/31/04:
 The lungs are clear.  Negative for edema.  Mild cardiac enlargement.

[view not recorded]
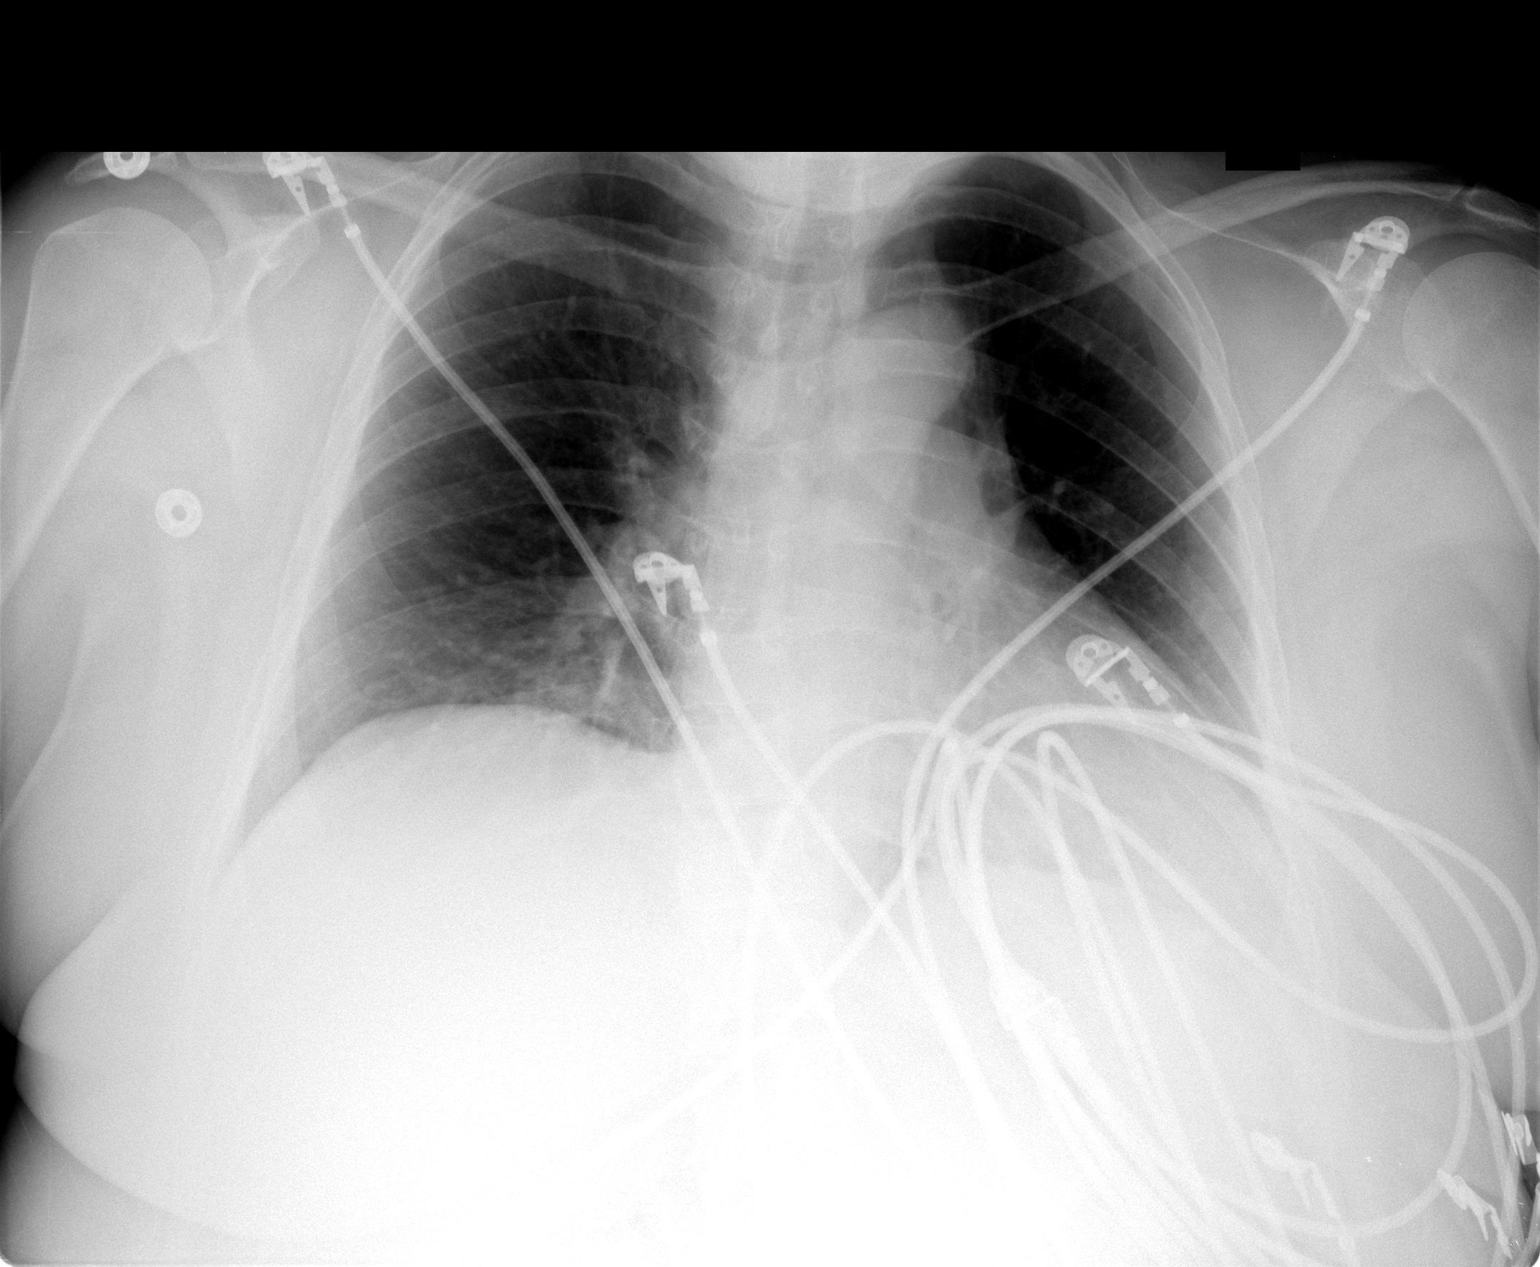

[1 of 1 positions shown; findings below may reference images not displayed]

IMPRESSION: Mild cardiac enlargement without acute pulmonary process.

## 2006-12-02 IMAGING — CT CT PELVIS W/ CM
1 of 3 series · 14 of 32 positions shown, 19 images · IV contrast ([ID]/H20 & 100 ML OMNI 300)
Comparison: No prior studies available for comparison.

CLINICAL DATA: 49-year-old, abdominal pain.   History of cholecystectomy.  The patient has epigastric abdominal pain.
TECHNIQUE: Helical CT examination of the abdomen and pelvis was performed after bolus infusion of a total of 100 cc of Omnipaque 300 and the use of dilute oral contrast.

[Series 2: abd pelvis · axial · 0.82mm/px · z∈[-465,-35]mm · 14 of 96 slices shown, 19 images]
[im 5/96  soft-tissue]
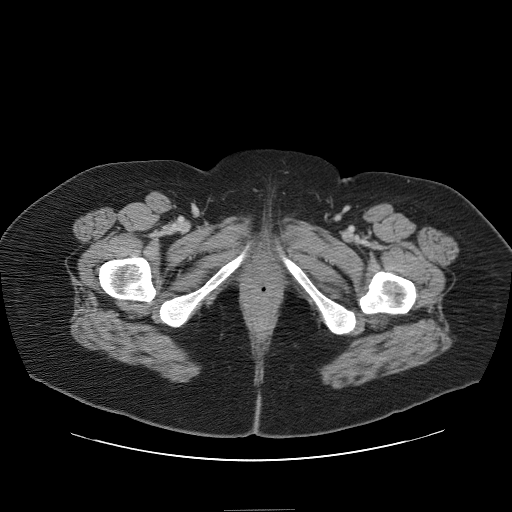
[im 5/96  bone]
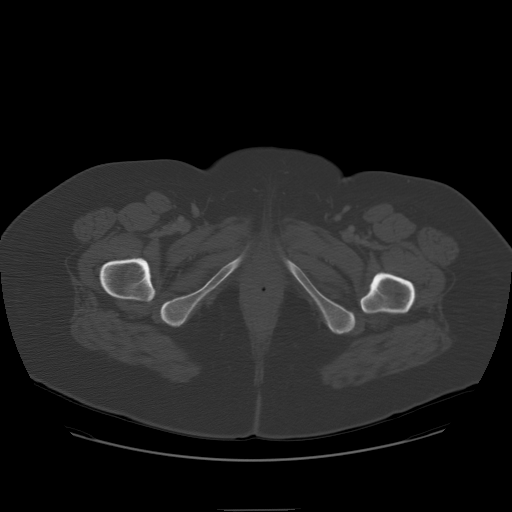
[im 15/96  soft-tissue]
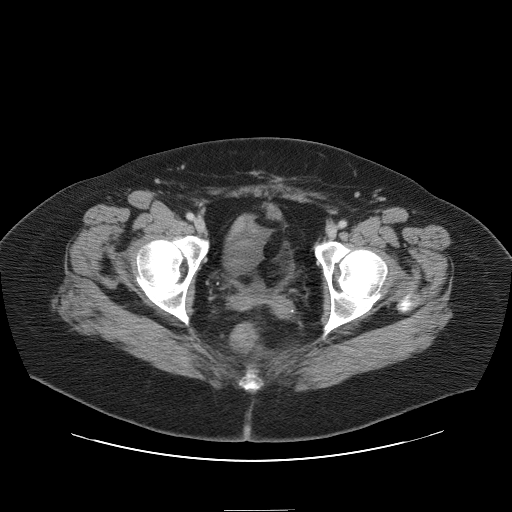
[im 20/96  soft-tissue]
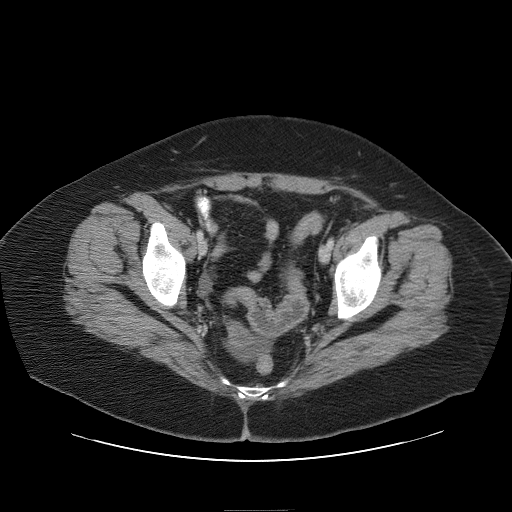
[im 29/96  soft-tissue]
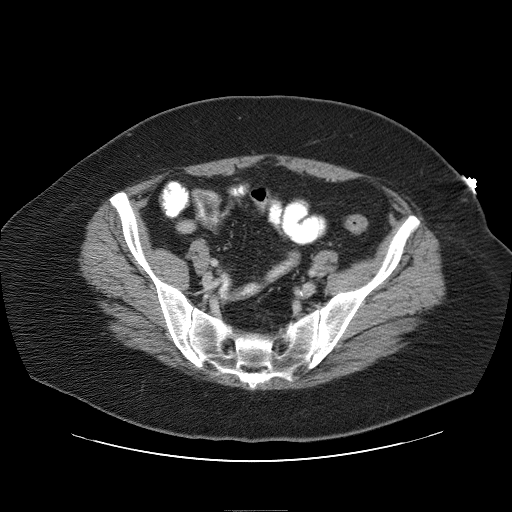
[im 34/96  soft-tissue]
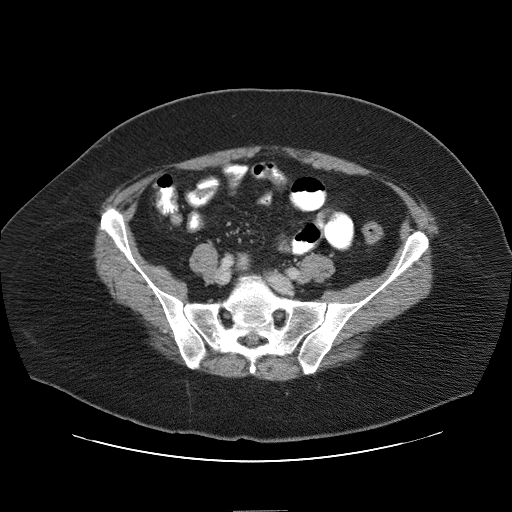
[im 43/96  soft-tissue]
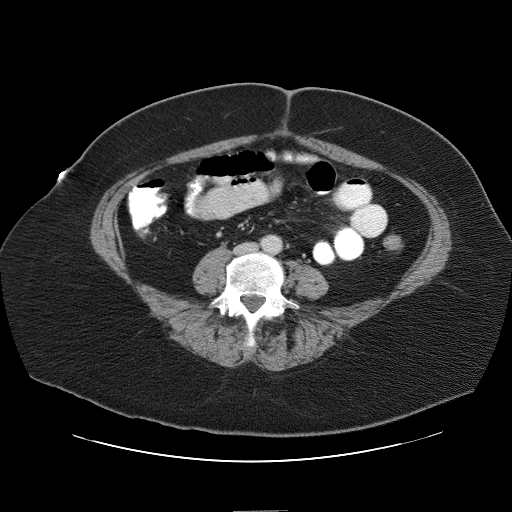
[im 48/96  soft-tissue]
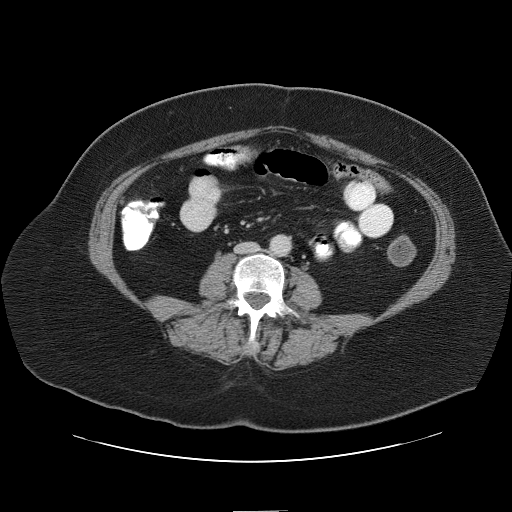
[im 53/96  soft-tissue]
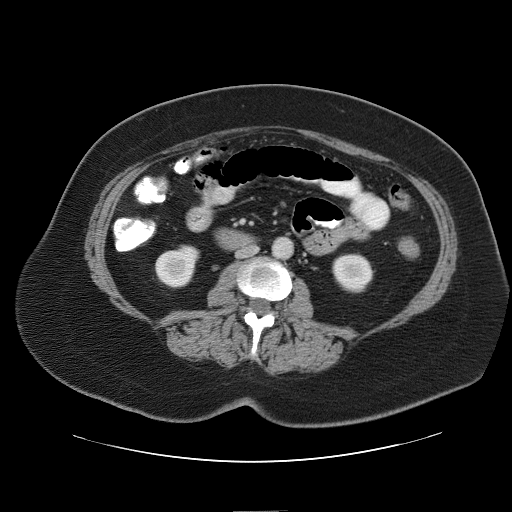
[im 62/96  soft-tissue]
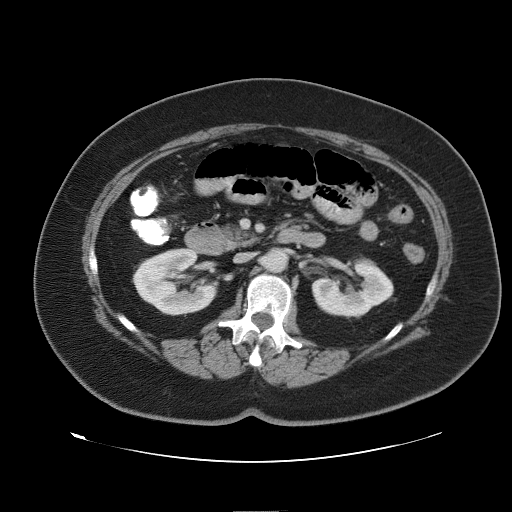
[im 62/96  bone]
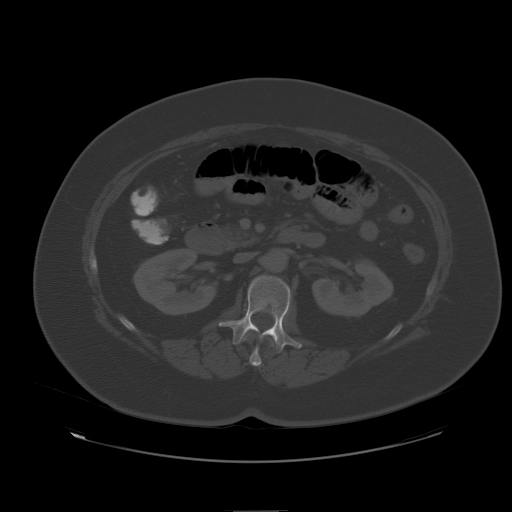
[im 67/96  soft-tissue]
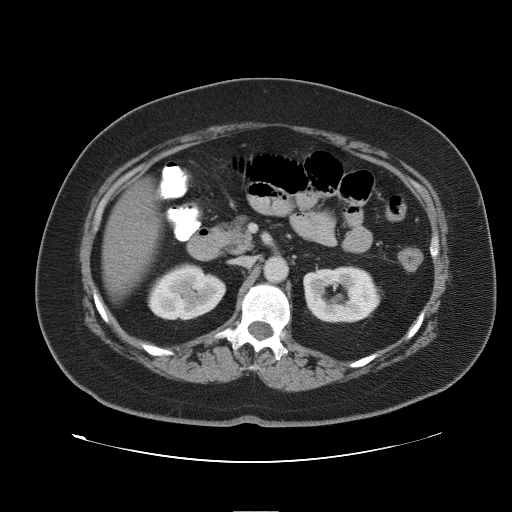
[im 77/96  soft-tissue]
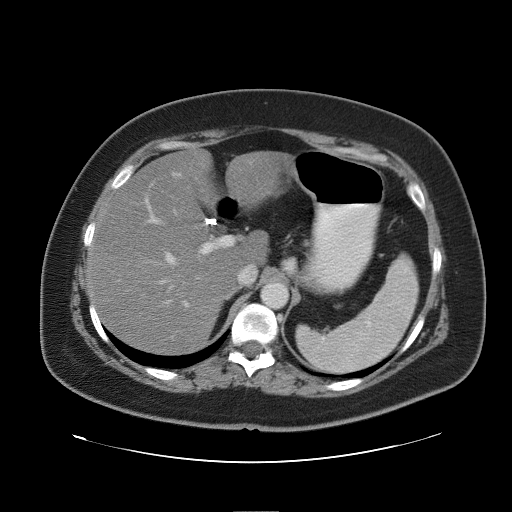
[im 77/96  lung]
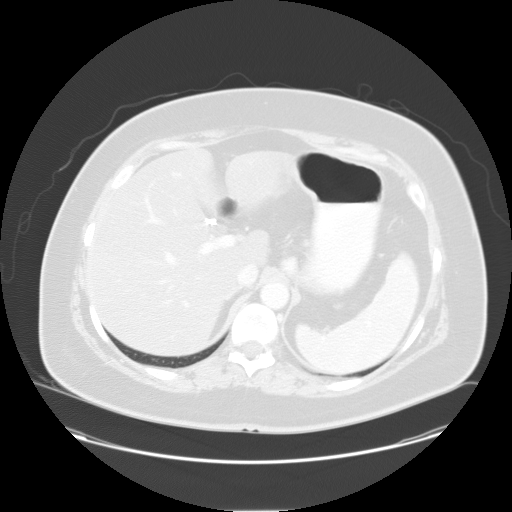
[im 81/96  soft-tissue]
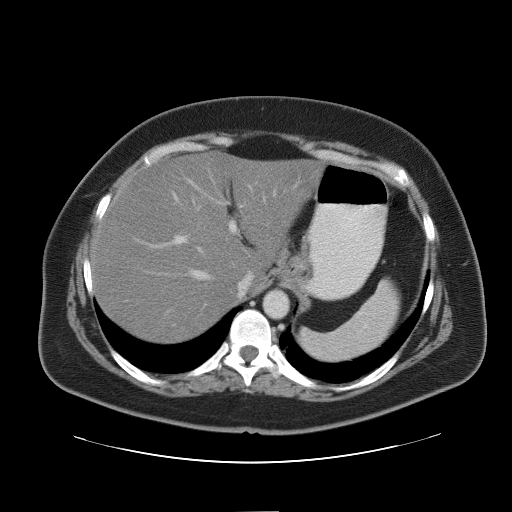
[im 81/96  lung]
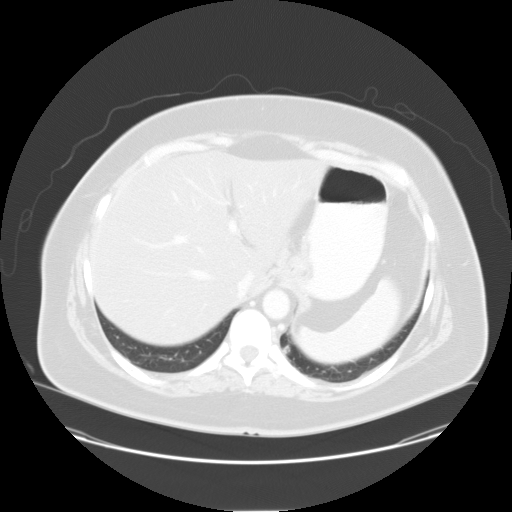
[im 86/96  lung]
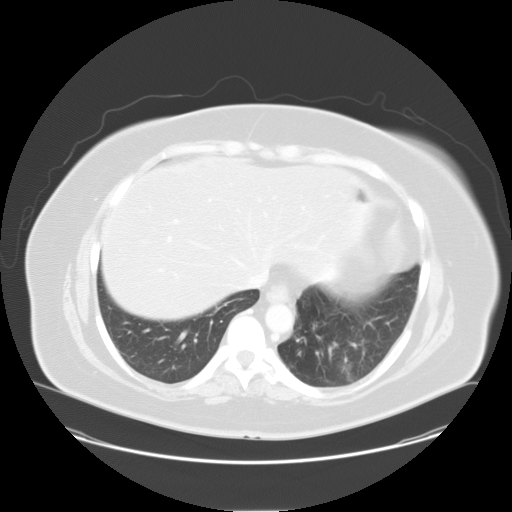
[im 91/96  soft-tissue]
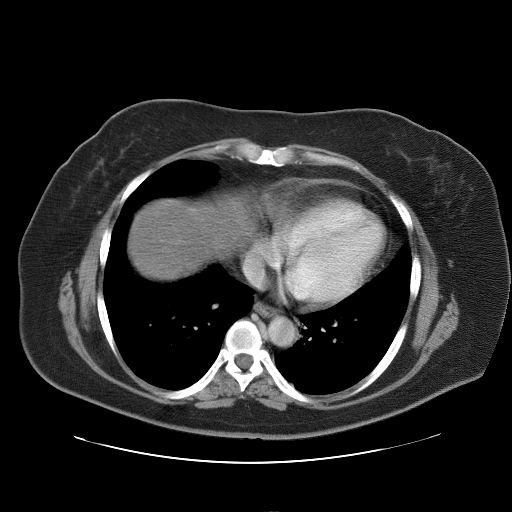
[im 91/96  lung]
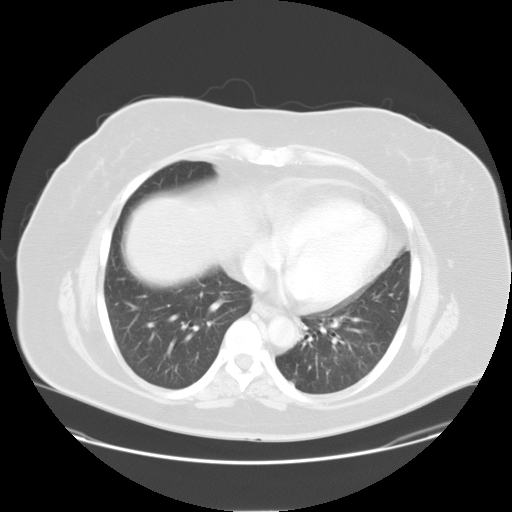

[14 of 32 positions shown; findings below may reference images not displayed]

The lung bases are clear.
ABDOMEN CT WITH CONTRAST: 
There is diffuse fatty infiltration of the liver.  No focal hepatic lesions.  The patient has had a cholecystectomy.  No intrahepatic ductal dilatation.  The spleen is normal in size.  The pancreas, adrenal glands, and kidneys demonstrate no significant abnormalities.  The stomach, duodenum, small bowel and colon demonstrate no significant abnormalities.   Aorta is normal in caliber and the major branch vessels are normal.  and splenic veins are patent.
IMPRESSION: 1.  Unremarkable CT abdomen.  
2.  Fatty infiltration of the liver. 
PELVIS CT WITH CONTRAST: 
The patient has had a hysterectomy.  Bowel and sigmoid colon and visualized small bowel loops appear normal.  No significant bony findings.
IMPRESSION: Unremarkable CT pelvis.

## 2006-12-03 ENCOUNTER — Ambulatory Visit: Payer: Self-pay | Admitting: Internal Medicine

## 2006-12-04 ENCOUNTER — Inpatient Hospital Stay (HOSPITAL_COMMUNITY): Admission: EM | Admit: 2006-12-04 | Discharge: 2006-12-06 | Payer: Self-pay | Admitting: Emergency Medicine

## 2007-01-02 ENCOUNTER — Ambulatory Visit: Payer: Self-pay | Admitting: Internal Medicine

## 2007-01-08 ENCOUNTER — Ambulatory Visit (HOSPITAL_COMMUNITY): Admission: RE | Admit: 2007-01-08 | Discharge: 2007-01-08 | Payer: Self-pay | Admitting: Internal Medicine

## 2007-02-06 ENCOUNTER — Inpatient Hospital Stay (HOSPITAL_COMMUNITY): Admission: EM | Admit: 2007-02-06 | Discharge: 2007-02-09 | Payer: Self-pay | Admitting: Emergency Medicine

## 2007-02-06 ENCOUNTER — Ambulatory Visit: Payer: Self-pay | Admitting: Internal Medicine

## 2007-02-11 ENCOUNTER — Inpatient Hospital Stay (HOSPITAL_COMMUNITY): Admission: EM | Admit: 2007-02-11 | Discharge: 2007-02-15 | Payer: Self-pay | Admitting: *Deleted

## 2007-02-11 ENCOUNTER — Ambulatory Visit: Payer: Self-pay | Admitting: Gastroenterology

## 2007-02-11 ENCOUNTER — Ambulatory Visit: Payer: Self-pay | Admitting: Internal Medicine

## 2007-02-27 IMAGING — CR DG ABDOMEN ACUTE W/ 1V CHEST
3 series · 3 of 3 positions shown · non-contrast
Comparison: 12/28/2004

CLINICAL DATA: Vomiting and diarrhea.

[view not recorded (1 of 3)]
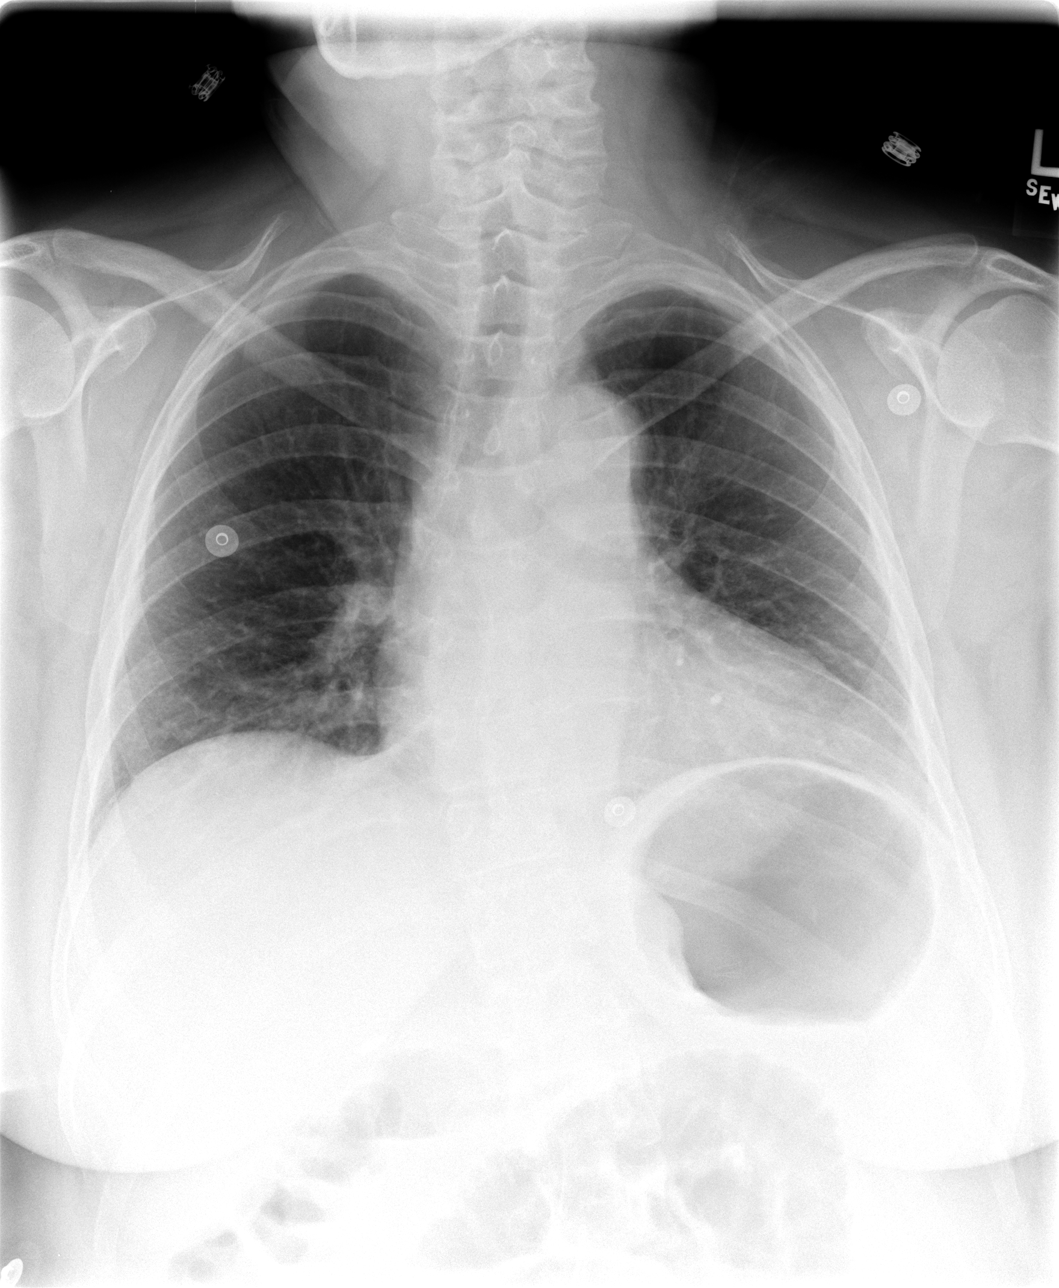

[view not recorded (2 of 3)]
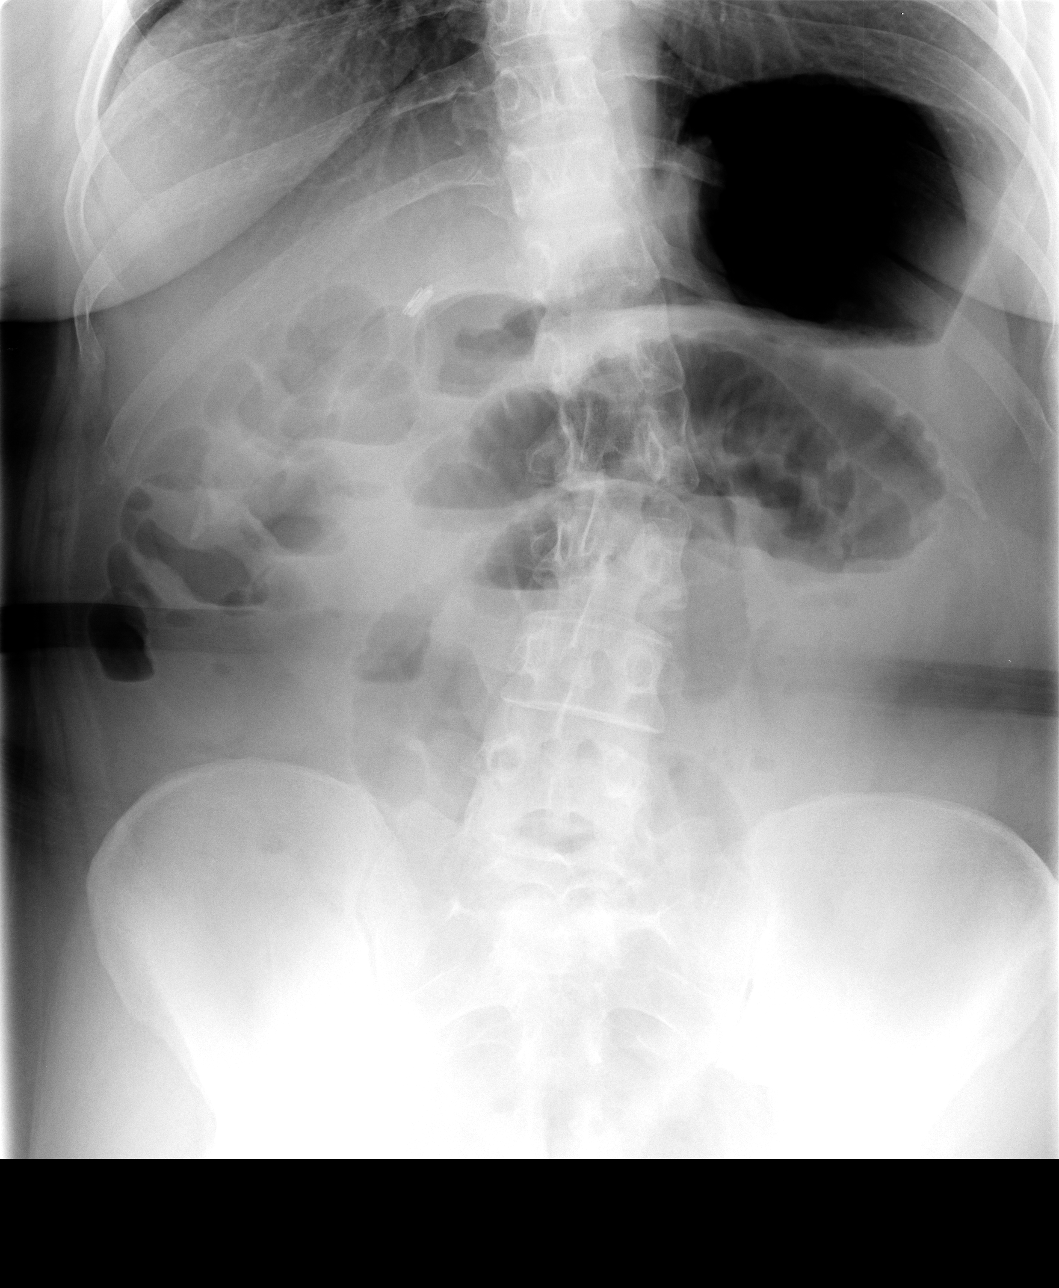

[view not recorded (3 of 3)]
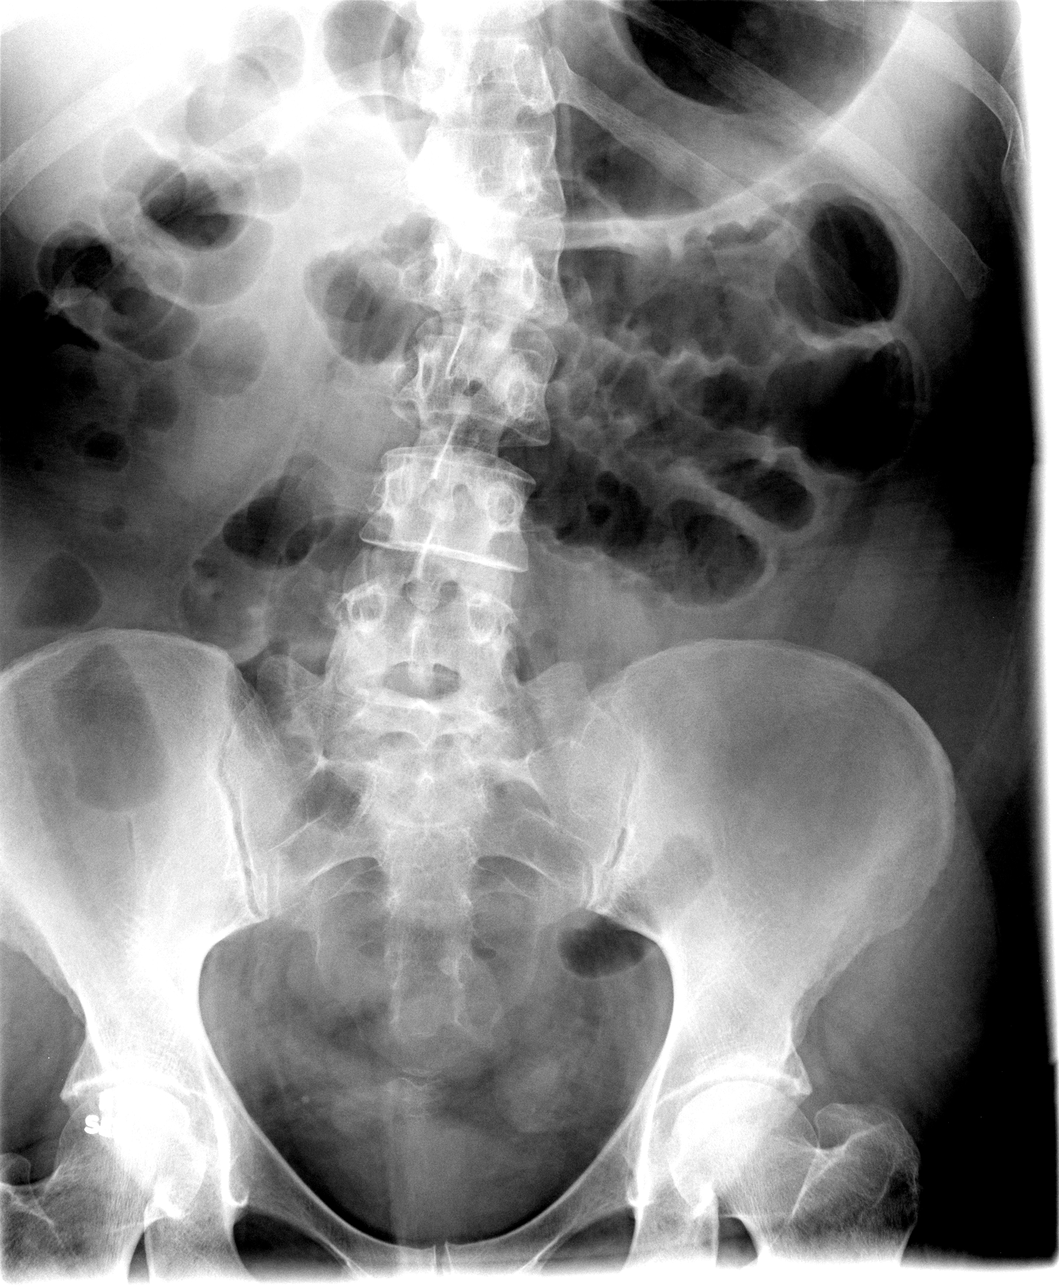

[3 of 3 positions shown; findings below may reference images not displayed]

ABDOMEN SERIES - 2 VIEW & CHEST - 1 VIEW:

Low volume film. The lungs are clear.  The cardiopericardial silhouette is
within normal limits for size.  Imaged bony structures of the thorax are intact.

No evidence for intraperitoneal free air. Prominent gastric bubble. Air is seen
in the hepatic flexure with mild distention of central small bowel loops. Convex
leftward scoliosis of the lumbar spine is unchanged.
IMPRESSION: No acute cardiopulmonary process.

Mild gaseous small bowel distention. Gastroenteritis or ileus would be a
consideration. If clinically indicated, CT scanning may prove helpful to further
evaluate.

## 2007-02-27 IMAGING — CT CT ABDOMEN W/ CM
1 of 2 series · 15 of 32 positions shown, 19 images · IV contrast (APPLIED)
Comparison: 01/08/2005 and chest and abdomen radiographs obtained earlier today.

ABDOMEN CT WITH CONTRAST

CLINICAL DATA: Sudden onset of abdominal pain, vomiting and diarrhea.
TECHNIQUE: Multidetector CT imaging of the abdomen and pelvis was performed
following the standard protocol during bolus administration of intravenous
contrast.

Contrast:  100 cc Omnipaque 300

[Series 2: abd/pelv with 5.0 b30f st · axial · 0.72mm/px · z∈[-463,-28]mm · 15 of 95 slices shown, 19 images]
[im 4/95  soft-tissue]
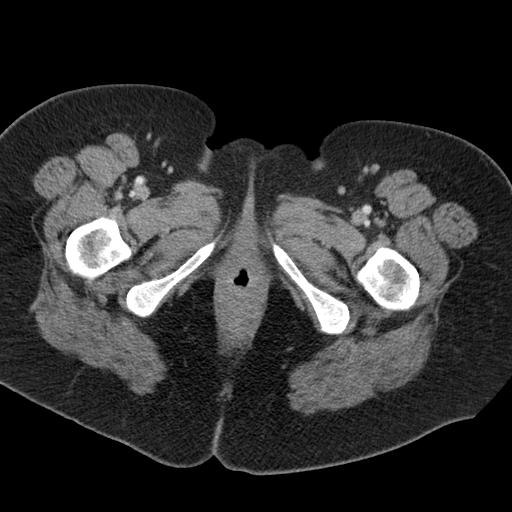
[im 4/95  bone]
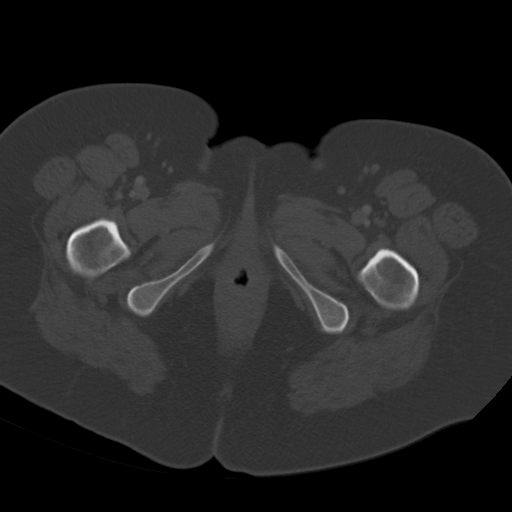
[im 12/95  soft-tissue]
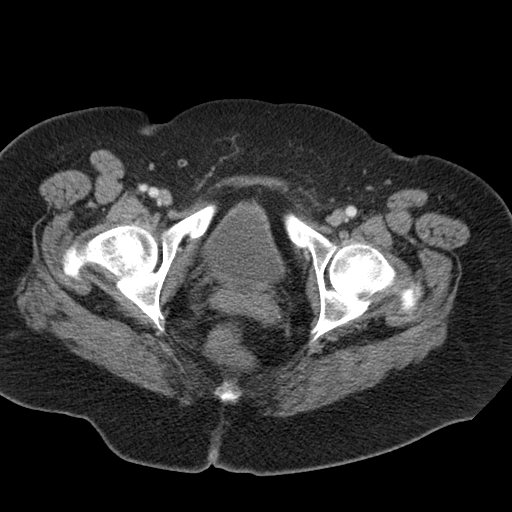
[im 20/95  soft-tissue]
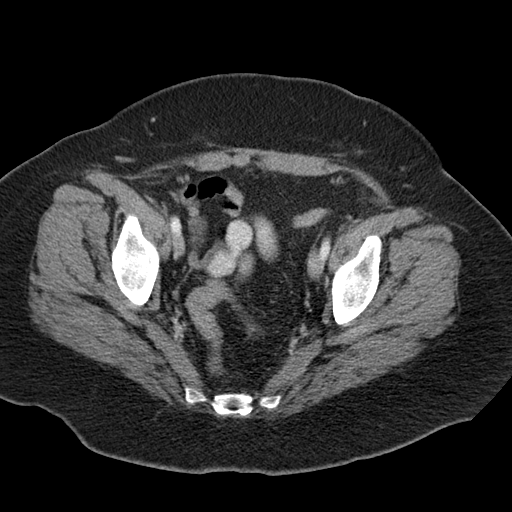
[im 28/95  soft-tissue]
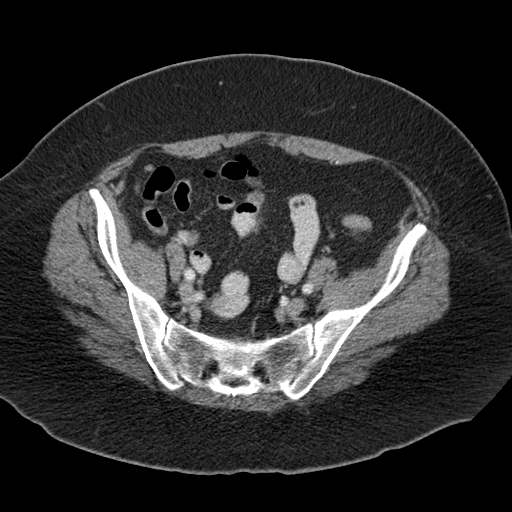
[im 32/95  soft-tissue]
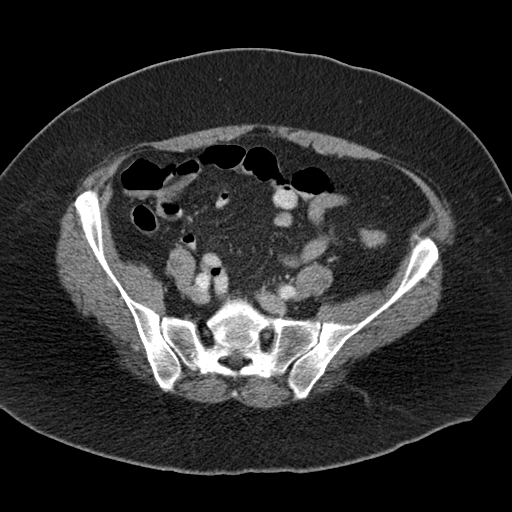
[im 40/95  soft-tissue]
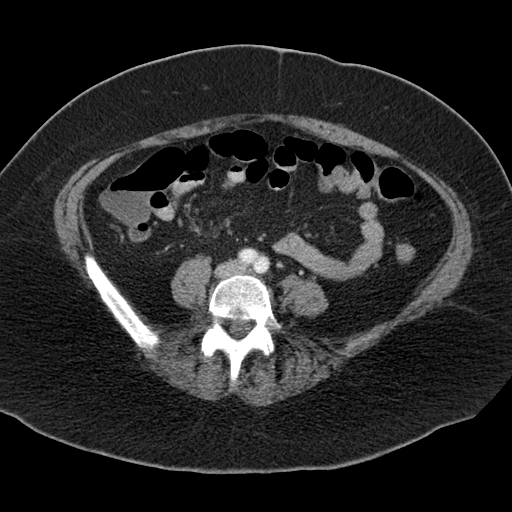
[im 48/95  soft-tissue]
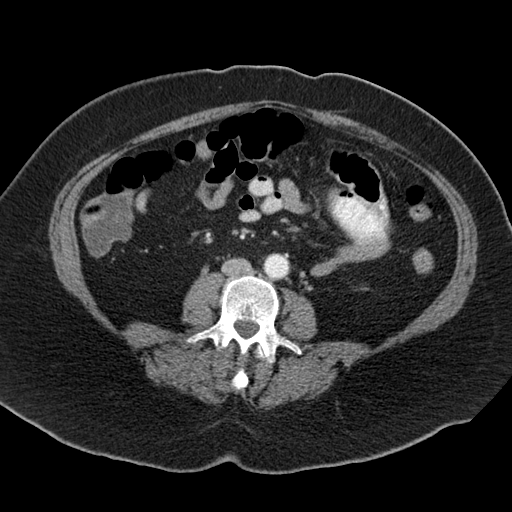
[im 55/95  soft-tissue]
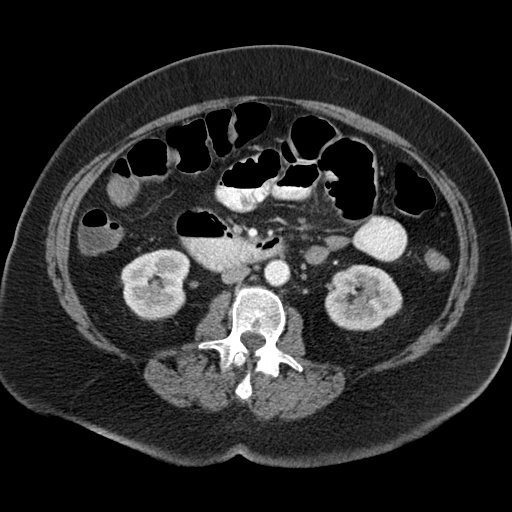
[im 63/95  soft-tissue]
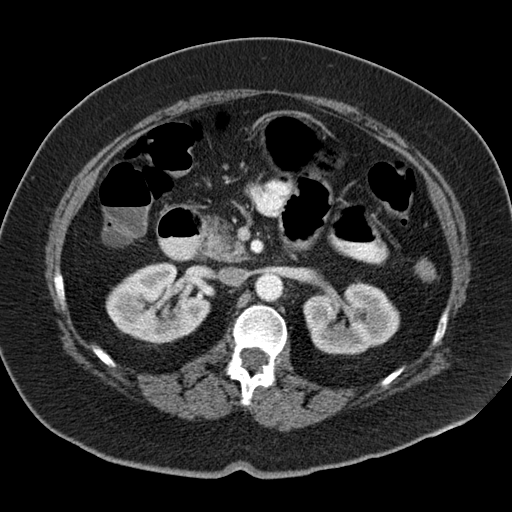
[im 63/95  bone]
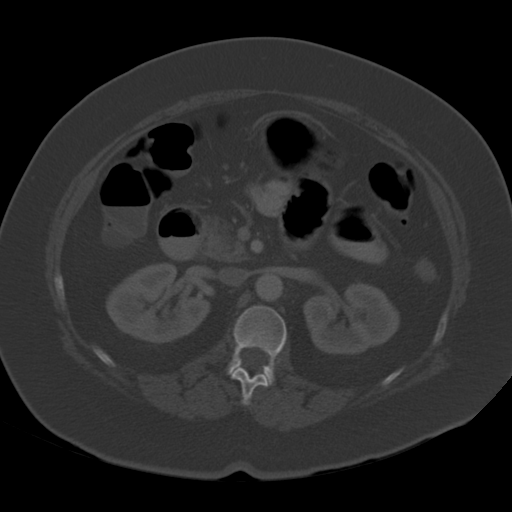
[im 67/95  soft-tissue]
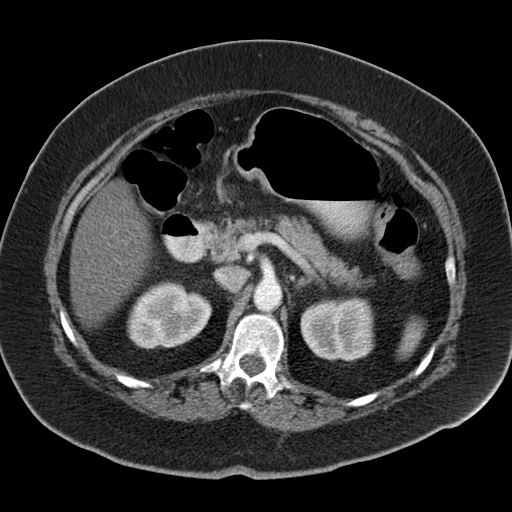
[im 75/95  soft-tissue]
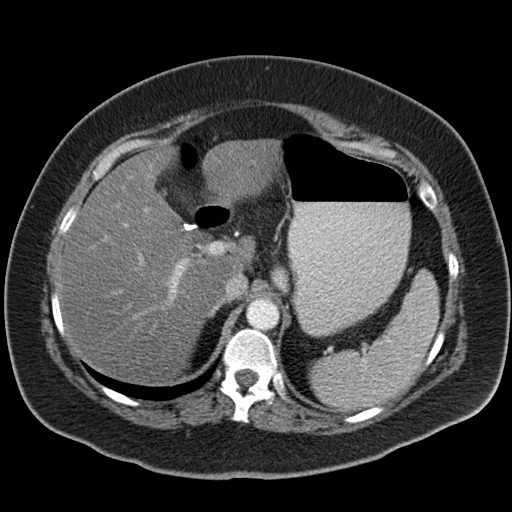
[im 79/95  lung]
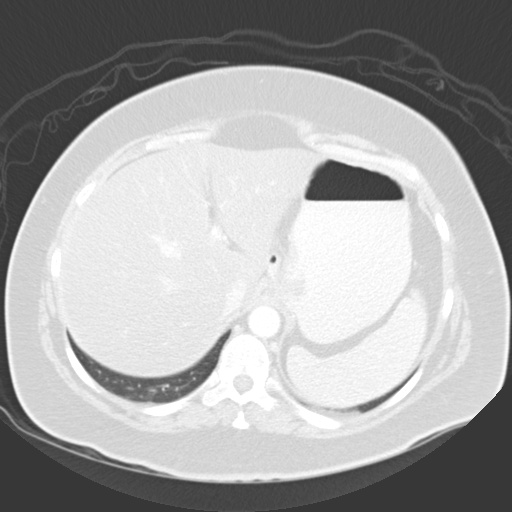
[im 83/95  soft-tissue]
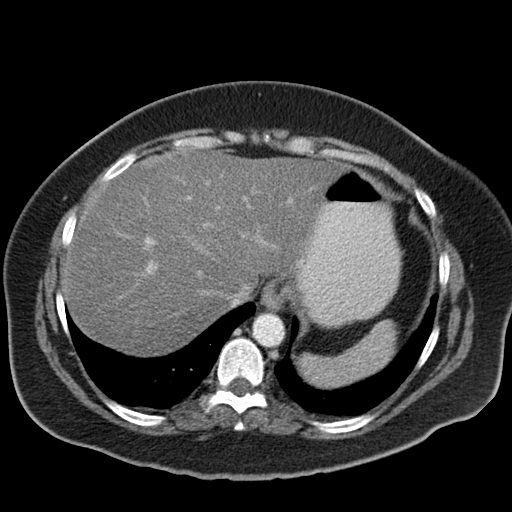
[im 83/95  lung]
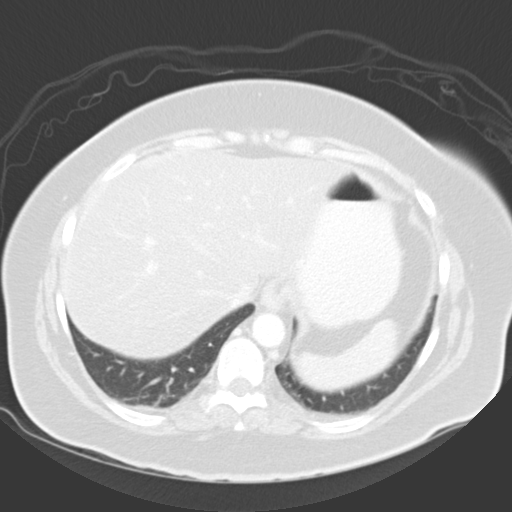
[im 87/95  lung]
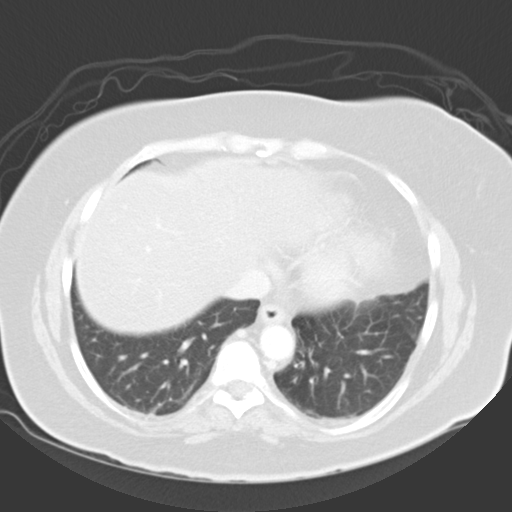
[im 91/95  soft-tissue]
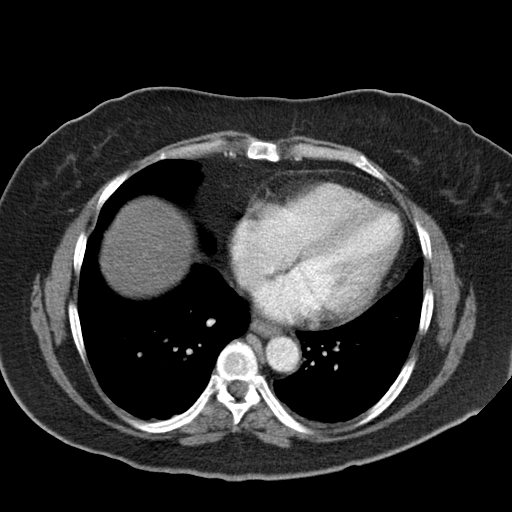
[im 91/95  lung]
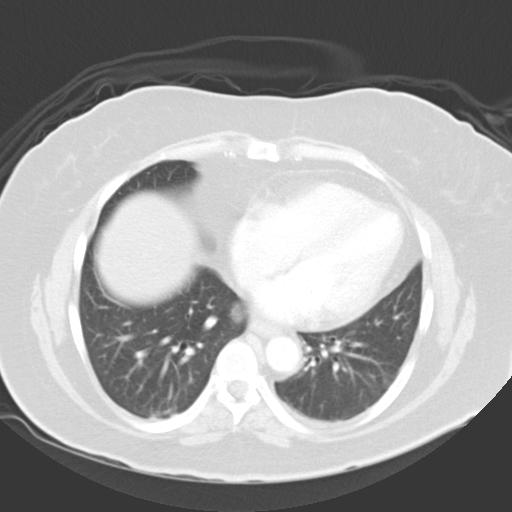

[15 of 32 positions shown; findings below may reference images not displayed]

FINDINGS: Stable diffuse fatty infiltration of the liver. Stable
cholecystectomy clips. Dilated proximal small bowel with a transition to normal
caliber small bowel in the left lower abdomen. No hernia identified and no
obstructing mass visualized. No free peritoneal air or fluid. Normal appearing
spleen, pancreas, kidneys and adrenal glands. Clear lung bases. Mild facet
degenerative changes in the lower lumbar spine.

IMPRESSION

1. Partial small bowel obstruction with a transition point in the left lower
abdomen. This may be due to adhesion formation.

2. Stable diffuse fatty infiltration of the liver.

PELVIS CT WITH CONTRAST
FINDINGS: Unremarkable pelvic loops of colon and small bowel. Normal appearing
urinary bladder. Surgically absent uterus. 3.1 x 1.4 cm right ovarian cyst
containing a thin internal septation. The left ovary is not identified. No
enlarged lymph nodes, free peritoneal fluid or free peritoneal air. Unremarkable
bones.

IMPRESSION

1. Status post hysterectomy.

2. 3.1 cm right ovarian cyst containing a thin internal septation. This could be
better characterized by pelvic ultrasound.

## 2007-02-28 IMAGING — CR DG ABDOMEN 2V
2 series · 2 of 2 positions shown · non-contrast
Comparison: CT abdomen and pelvis and acute abdomen series yesterday.

CLINICAL DATA: Followup partial small bowel obstruction.

ABDOMEN - 2 VIEW  04/06/2005:

[w abdomen upright *]
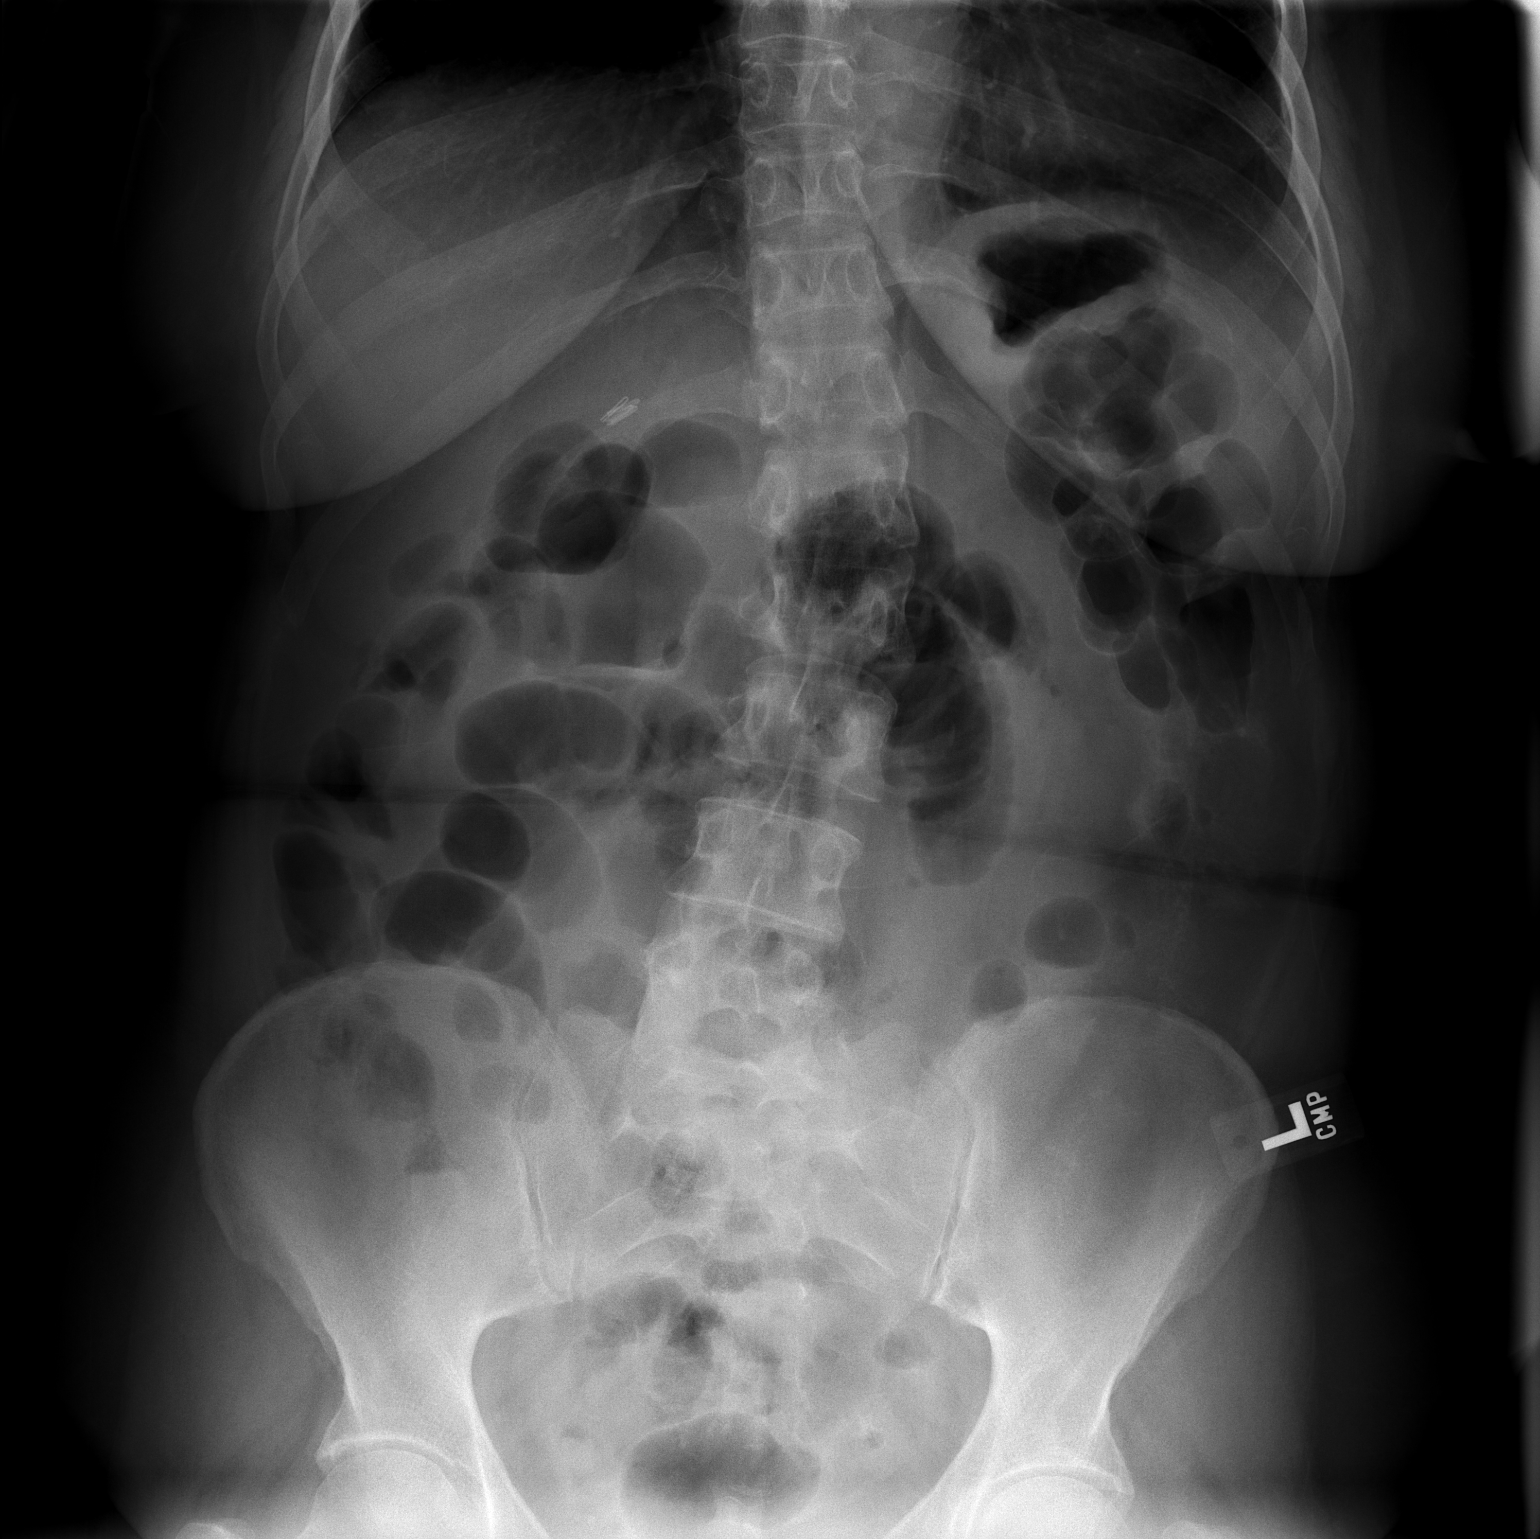

[t abdomen supine]
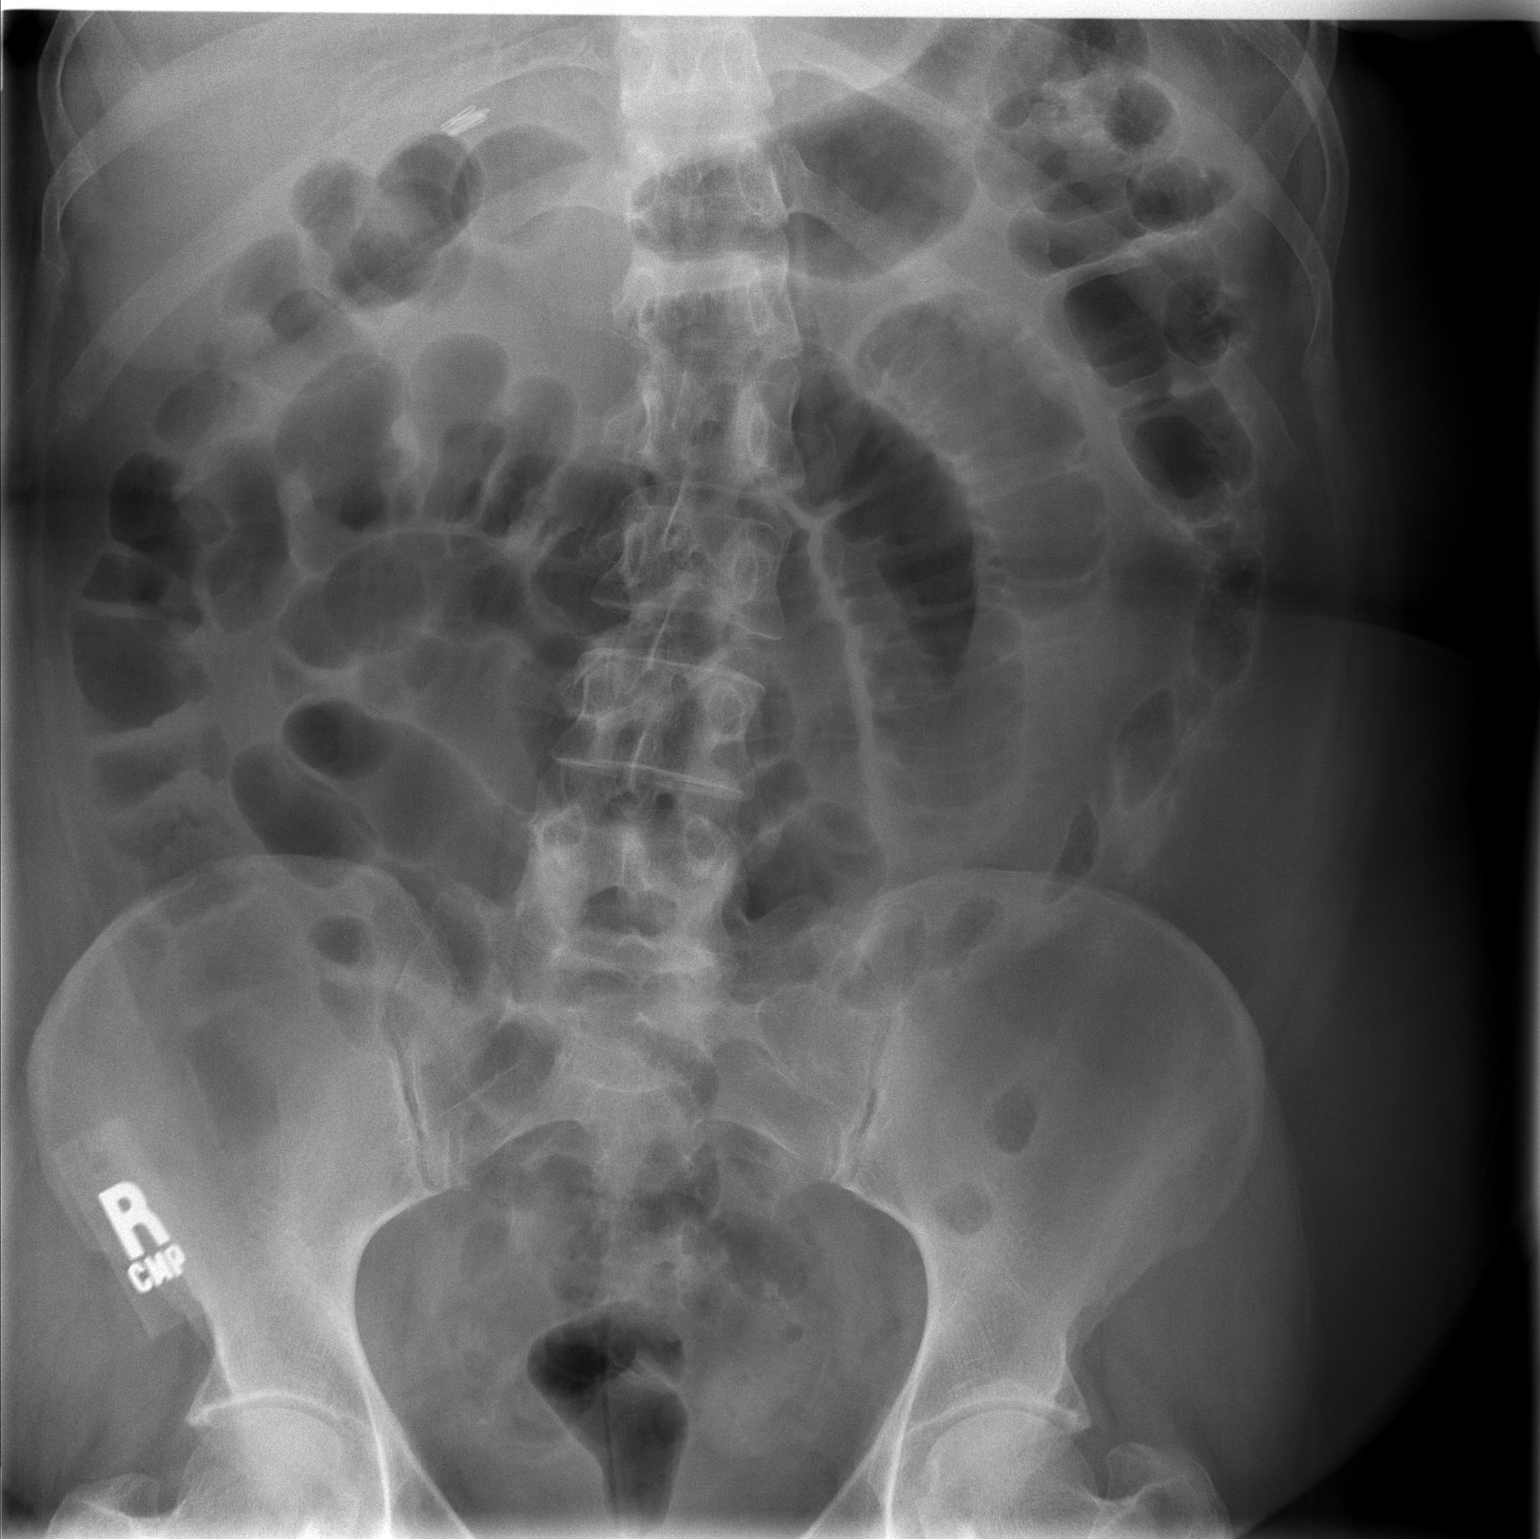

[2 of 2 positions shown; findings below may reference images not displayed]

FINDINGS: Since yesterday, and there has been slight improvement in the partial
small bowel destructive pattern. There are still several mildly distended loops
of small bowel in the left upper quadrant, though these are less distended than
yesterday. Gas is present in several normal caliber loops of small bowel in the
right side of the abdomen. Gas is also noted throughout a normal caliber colon
to the rectum. There is no evidence of free air on the erect view. Surgical
clips are again noted in the right upper quadrant from prior cholecystectomy.
The thoracolumbar scoliosis is again noted.
IMPRESSION: 1. Slight improvement in the partial small bowel obstruction since yesterday,
though several distended loops of small bowel persist in the left upper
quadrant. 
2. No free intraperitoneal air.

## 2007-03-02 IMAGING — CR DG ABDOMEN 1V
1 series · 1 of 1 positions shown · non-contrast
Comparison: 04/07/05.

CLINICAL DATA: Abdominal pain.   Follow-up small bowel obstruction.
 ABDOMEN ? 1 VIEW:

[view not recorded]
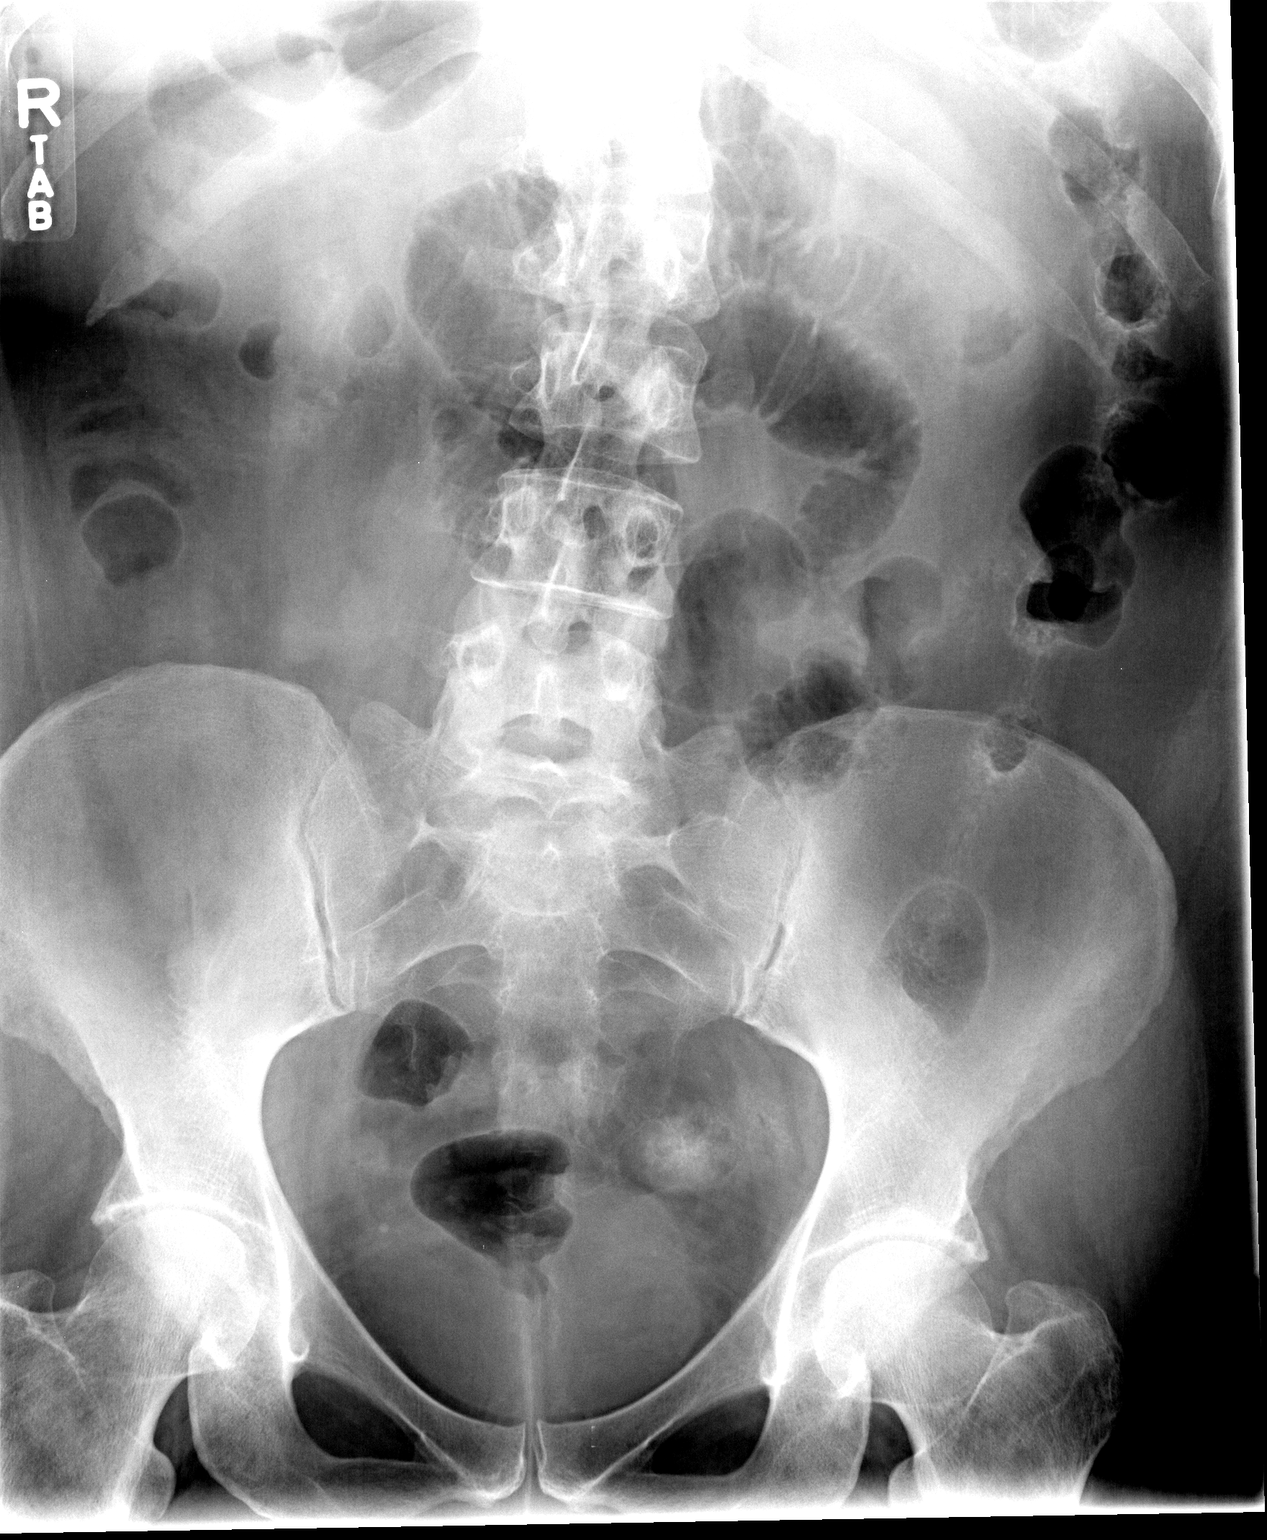

[1 of 1 positions shown; findings below may reference images not displayed]

Further decrease in gaseous dilatation of small bowel loops is seen since the previous study.  Increased colonic gas is now noted to the level of the rectum.
IMPRESSION: Resolving small bowel obstruction.

## 2007-03-03 IMAGING — CT CT ABDOMEN W/ CM
1 of 2 series · 15 of 32 positions shown, 19 images · IV contrast (omnipaque)
Comparison: CT study 04/05/05.

CLINICAL DATA: Small bowel obstruction.  Patient has undergone previous hysterectomy and cholecystectomy.
 ABDOMEN CT WITH CONTRAST:
TECHNIQUE: Multidetector CT imaging of the abdomen was performed following the standard protocol during bolus administration of intravenous contrast.
 Contrast:  100 cc Omnipaque 350 and dilute oral contrast.
TECHNIQUE: Multidetector CT imaging of the pelvis was performed following the standard protocol during bolus administration of intravenous contrast.
 Oral contrast extends through the small bowel and into the colon.  Sigmoid colon and rectum are decompressed, unremarkable in appearance.  There is no free fluid.  No adnexal masses are seen on the current exam.  Uterus is surgically absent.  Bladder is midline, unremarkable.

[Series 2: abd/pelv with 5.0 b30f st · axial · 0.66mm/px · z∈[+754,+1179]mm · 15 of 95 slices shown, 19 images]
[im 5/95  soft-tissue]
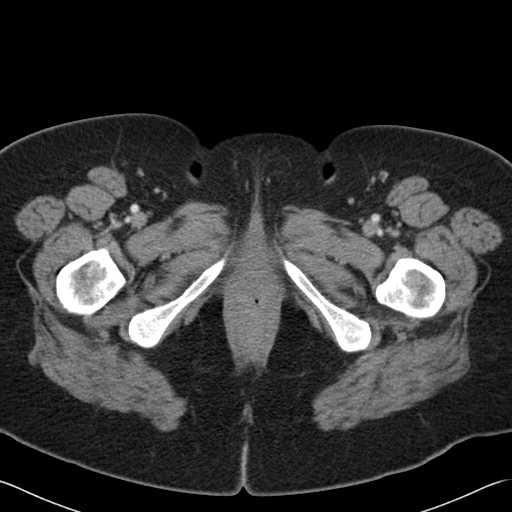
[im 5/95  bone]
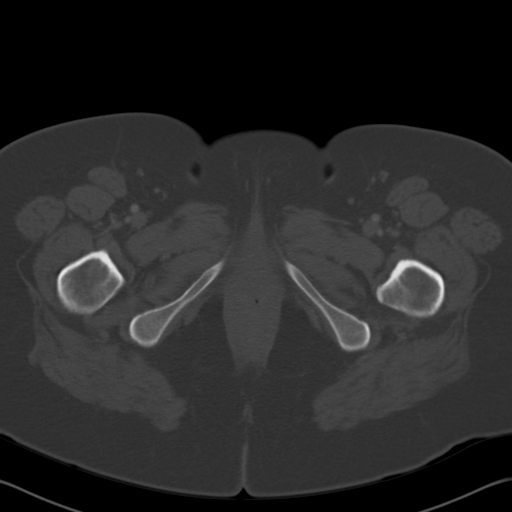
[im 13/95  soft-tissue]
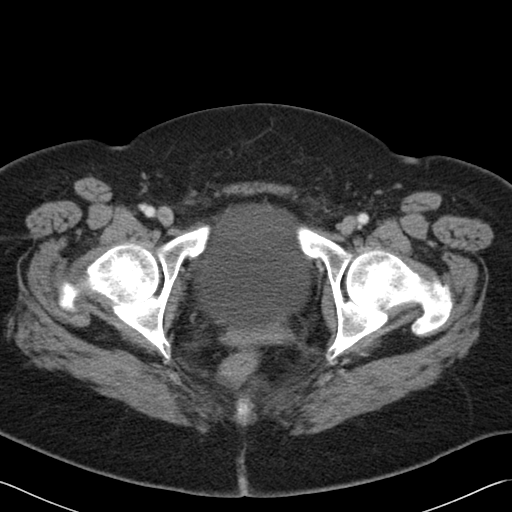
[im 21/95  soft-tissue]
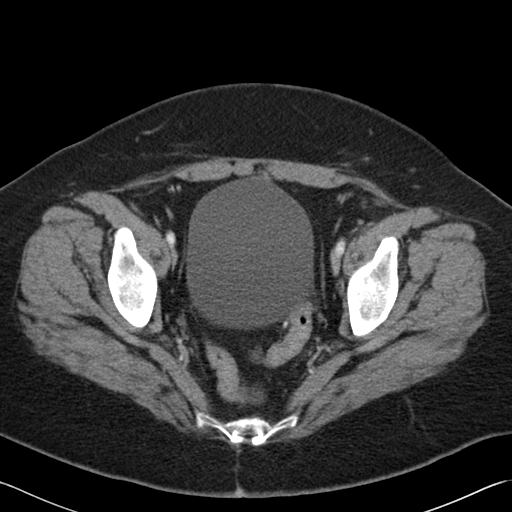
[im 25/95  soft-tissue]
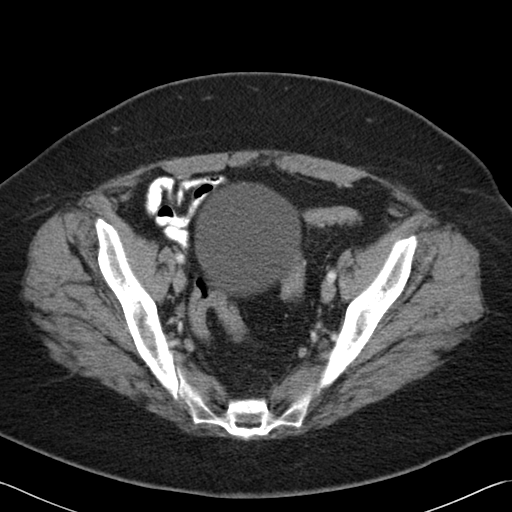
[im 33/95  soft-tissue]
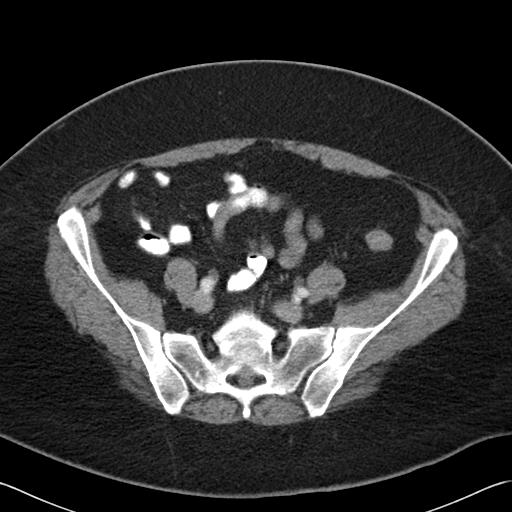
[im 41/95  soft-tissue]
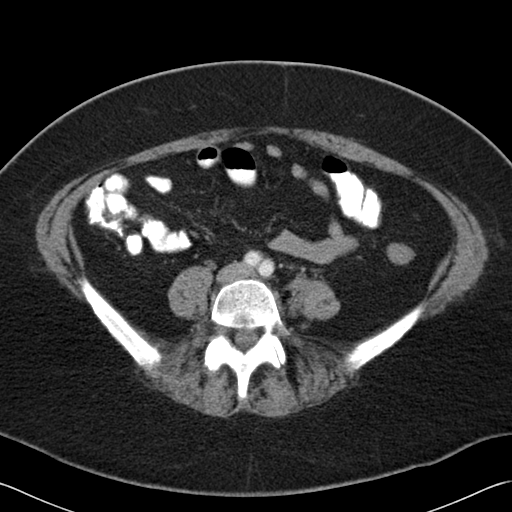
[im 50/95  soft-tissue]
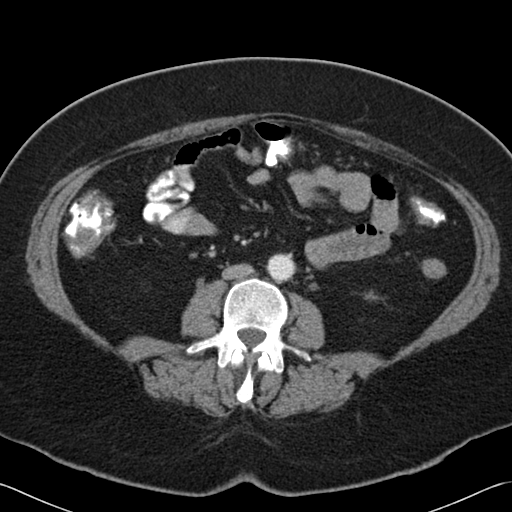
[im 54/95  soft-tissue]
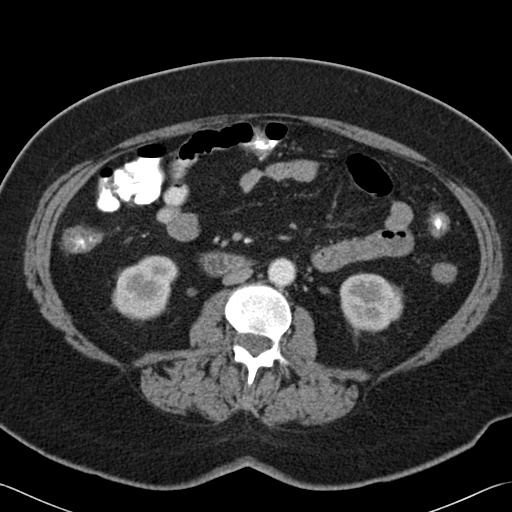
[im 62/95  soft-tissue]
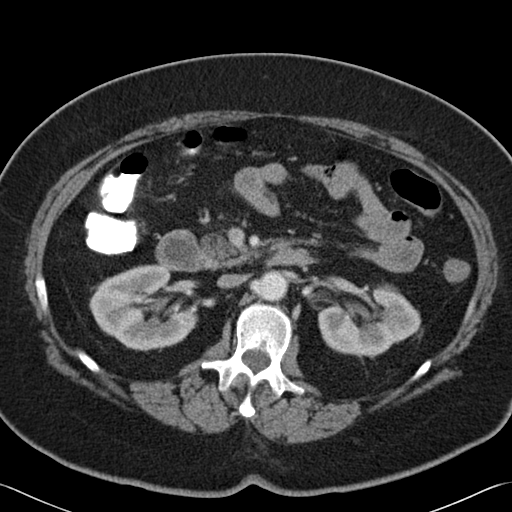
[im 62/95  bone]
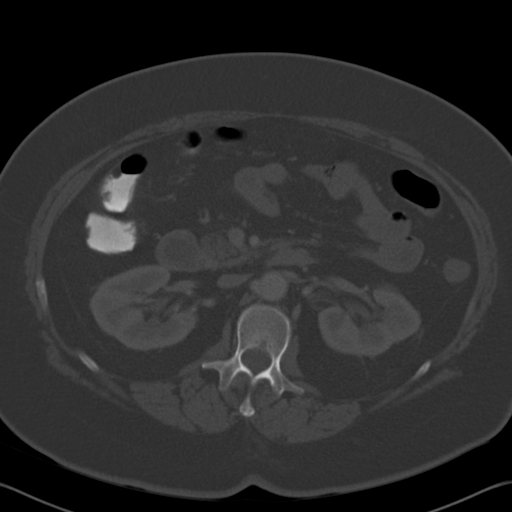
[im 70/95  soft-tissue]
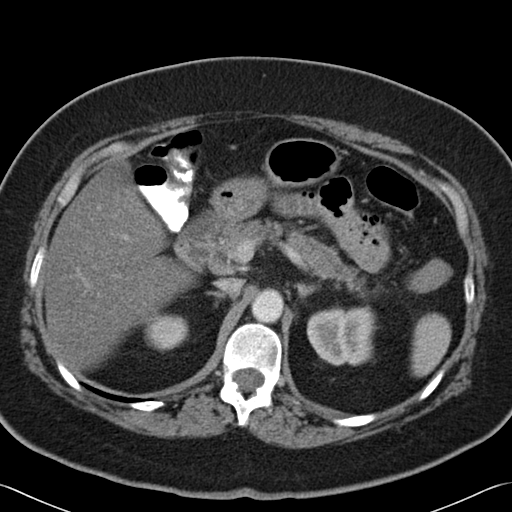
[im 74/95  soft-tissue]
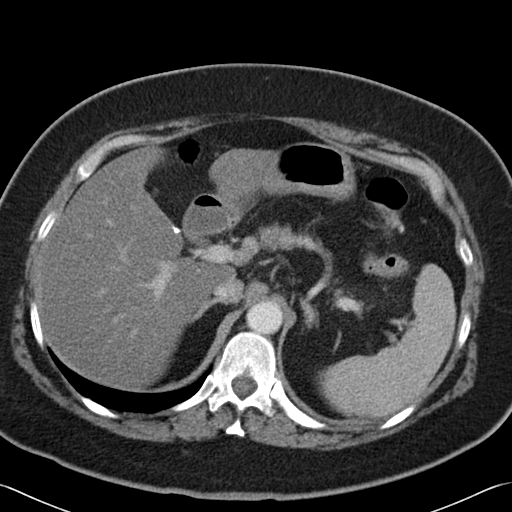
[im 78/95  lung]
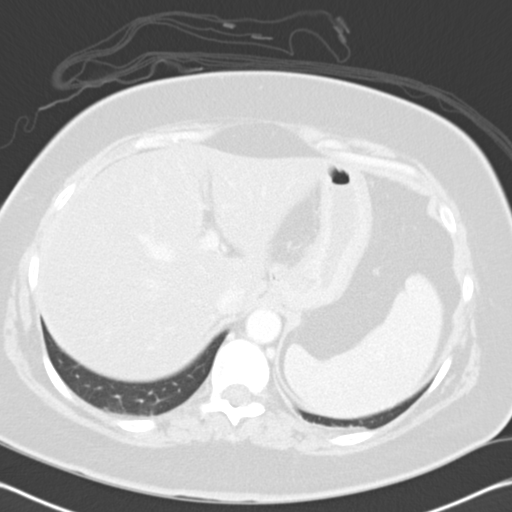
[im 82/95  soft-tissue]
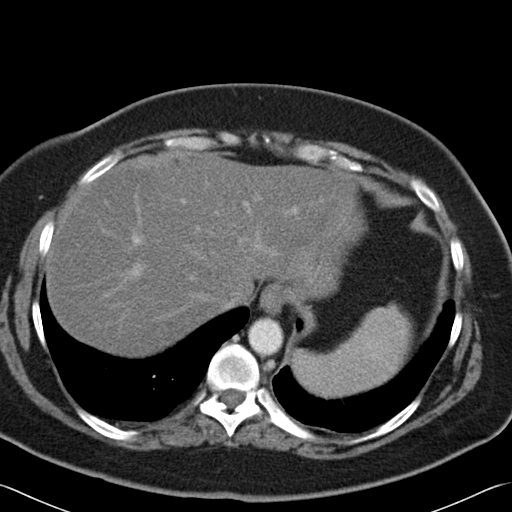
[im 82/95  lung]
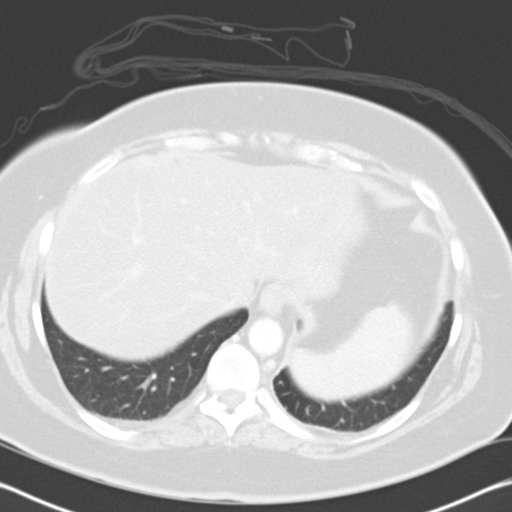
[im 86/95  lung]
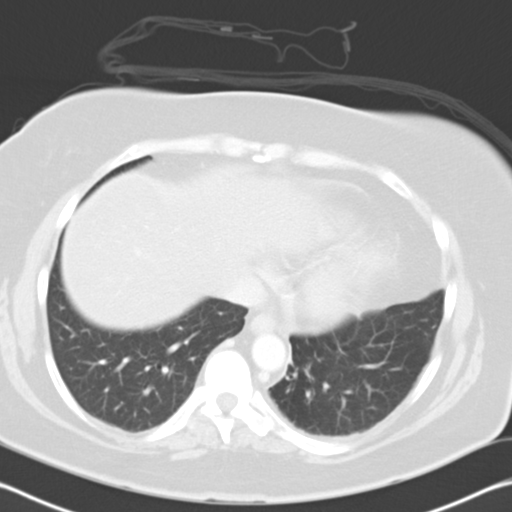
[im 90/95  soft-tissue]
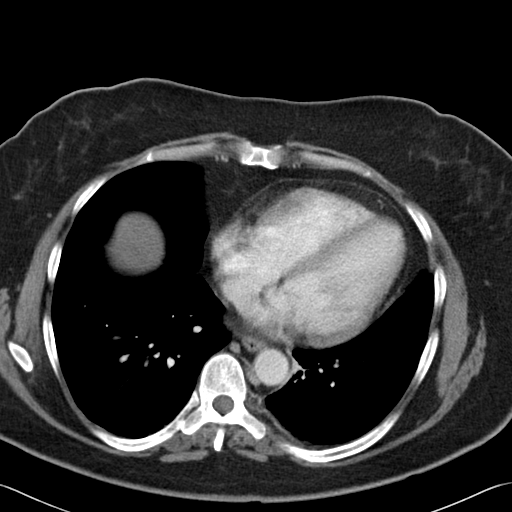
[im 90/95  lung]
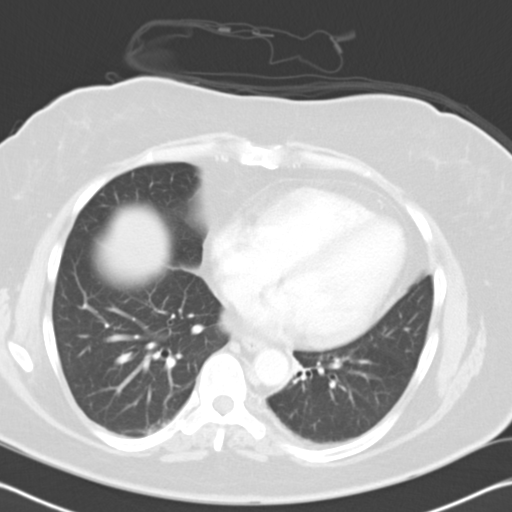

[15 of 32 positions shown; findings below may reference images not displayed]

Mild pleural thickening.  Right lung base is clear.  Liver is homogeneous in attenuation.  Gallbladder is surgically absent.  Spleen is normal.  Pancreas has a normal appearance. Stomach, small and large bowel are unremarkable.  Oral contrast extends into the colon.  There are no mesenteric inflammatory changes seen.  Kidneys show symmetric uptake and excretion of contrast without renal lesions.  Mesentery and retroperitoneal negative for adenopathy.
IMPRESSION: CT abdomen negative.
 PELVIS CT WITH CONTRAST:
IMPRESSION: CT pelvis negative.

## 2007-03-04 IMAGING — CR DG ABDOMEN 2V
2 series · 2 of 2 positions shown · non-contrast
Comparison: CT on 04/09/05.

CLINICAL DATA: Mid abdominal pain, nausea and vomiting.
 [id number] VIEW:

[w abdomen upright *]
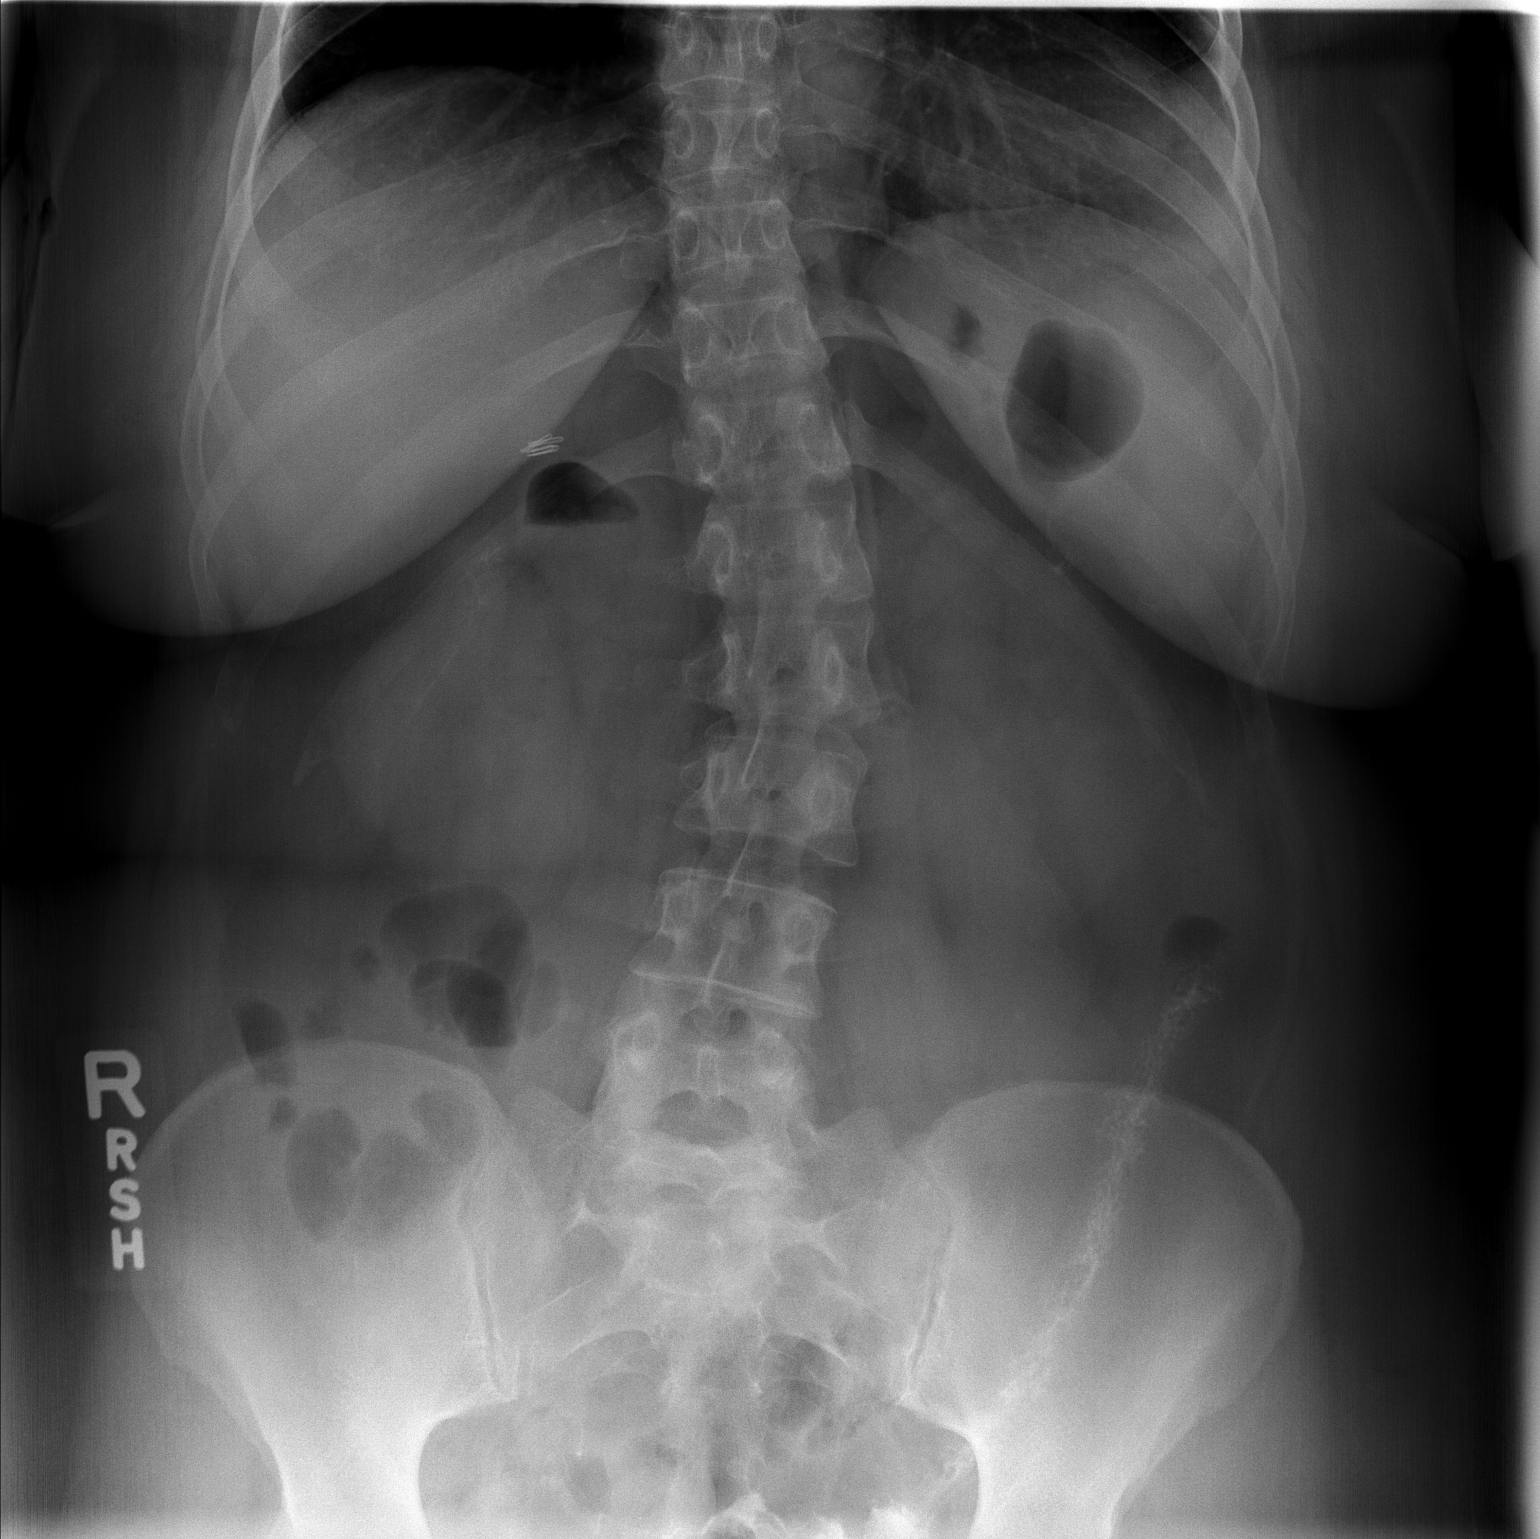

[t abdomen supine *]
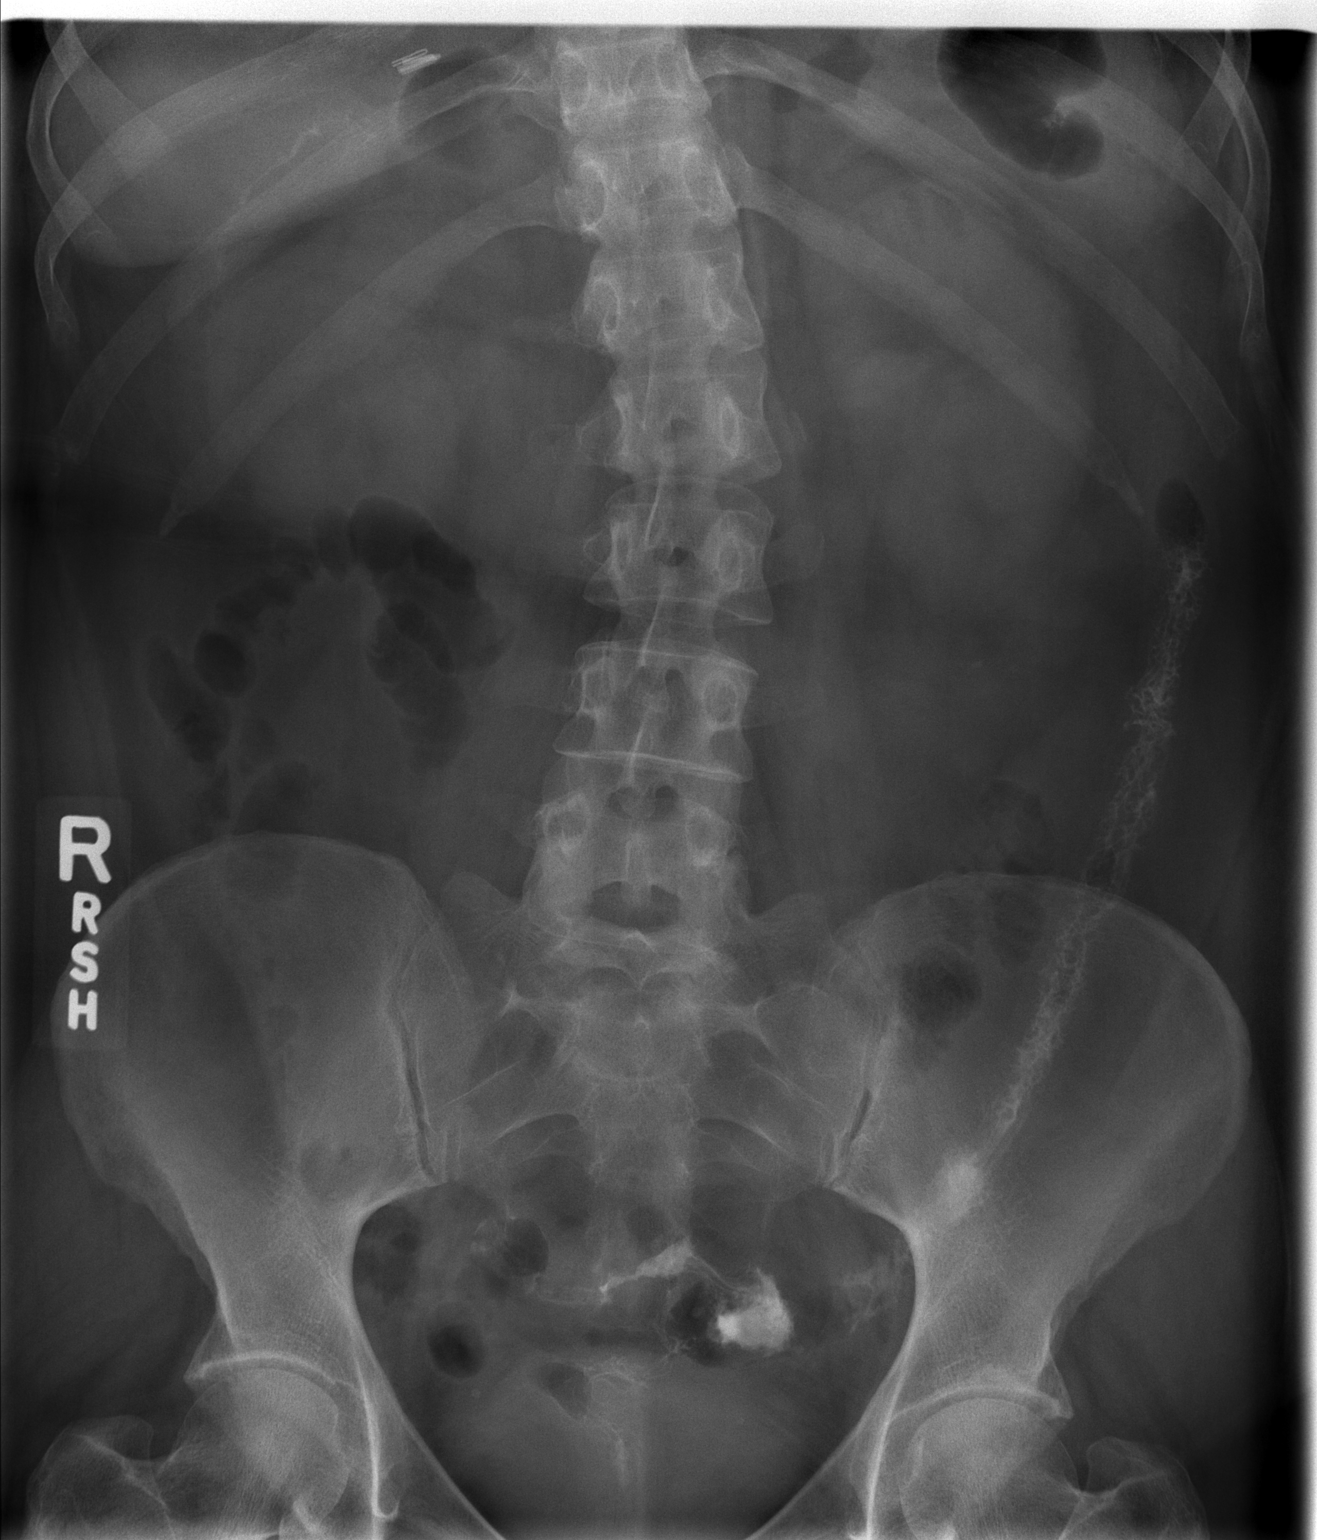

[2 of 2 positions shown; findings below may reference images not displayed]

Supine and erect views reveal metallic clips in the right upper quadrant.  Minimal amount of contrast remains in the colon.  There are a few loops of small bowel gas.  Scoliosis is present.
IMPRESSION: Abdomen is unremarkable.

## 2007-03-04 IMAGING — CR DG ABDOMEN 1V
2 series · 2 of 2 positions shown · non-contrast
Comparison: 04/10/05.

CLINICAL DATA: Small bowel obstruction.  Now with nausea and vomiting.
ABDOMEN ? 1 VIEW (2 IMAGES):

[view not recorded (1 of 2)]
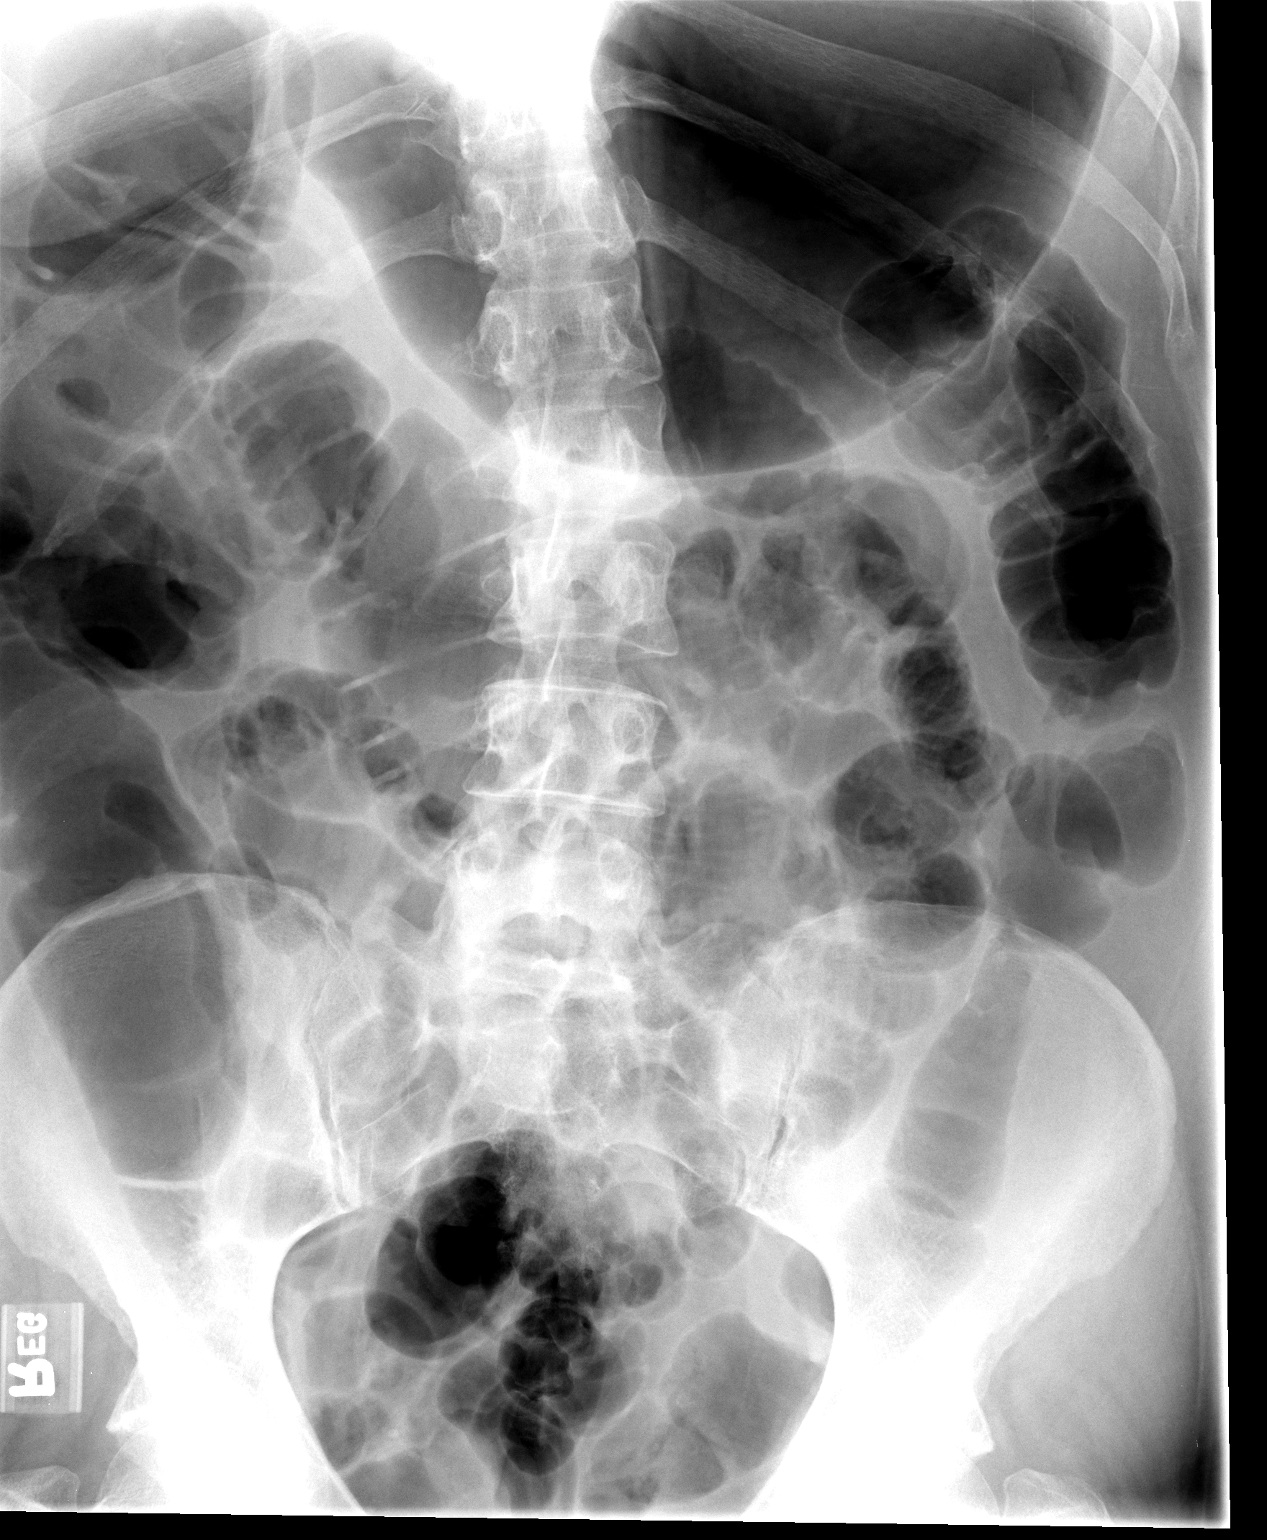

[view not recorded (2 of 2)]
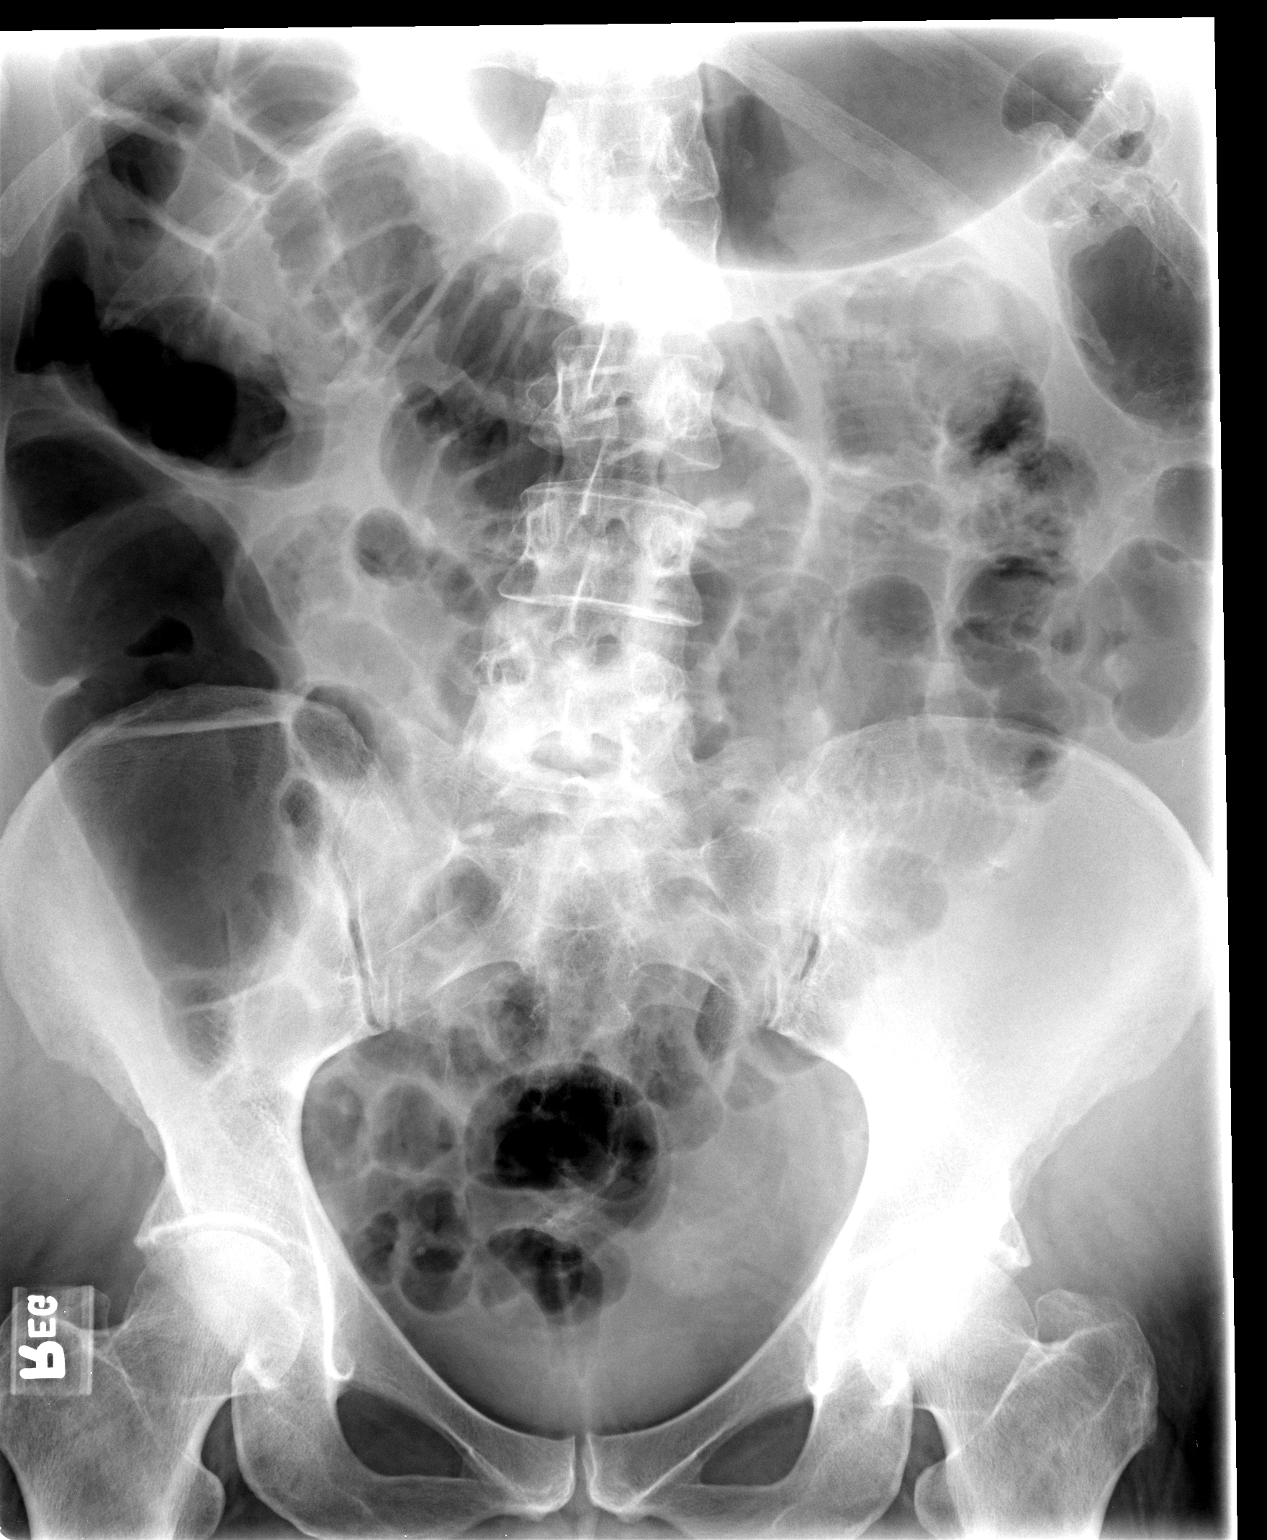

[2 of 2 positions shown; findings below may reference images not displayed]

FINDINGS: There is marked gaseous distention of the stomach, small bowel, as well as the colon.  This is significantly increased when compared to the exam earlier today.  Findings are consistent with either a partial small bowel obstruction or an adynamic ileus.
IMPRESSION: Findings are likely due to either a small bowel obstruction or an adynamic ileus.

## 2007-03-05 IMAGING — CR DG CHEST 1V PORT
1 series · 1 of 1 positions shown · non-contrast
Comparison: 12/31/04.

CLINICAL DATA: PICC line placement.  Small bowel obstruction. 
 PORTABLE CHEST - 1 VIEW - 04/11/05 AT 6872 HOURS:

[view not recorded]
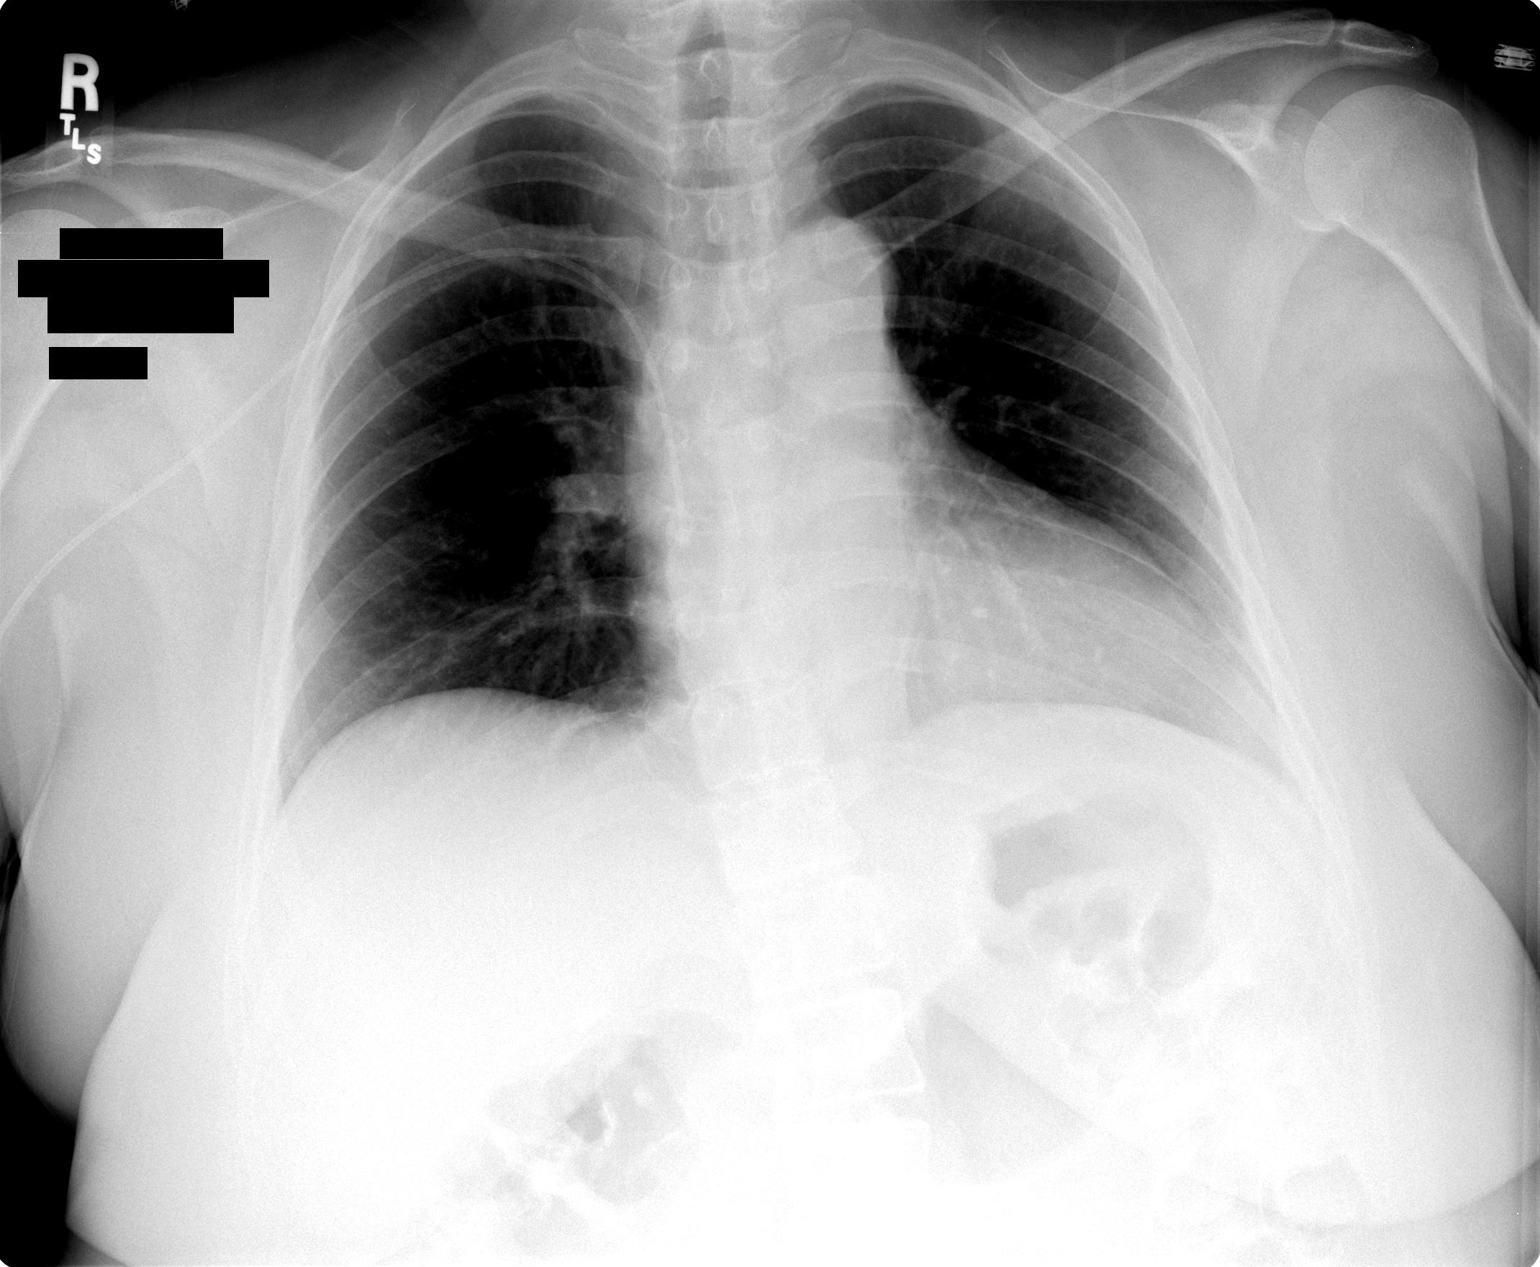

[1 of 1 positions shown; findings below may reference images not displayed]

Cardiomegaly is present.  There are no infiltrates or free air.  A PICC line has been placed in the right arm with its tip in the SVC/RA junction.  There is no pneumothorax.  There is no significant change in aeration from the previous film.
IMPRESSION: 1.  Cardiomegaly without active cardiopulmonary disease. 
 2.  PICC line tip at the SVC/RA junction.  No pneumothorax.

## 2007-03-05 IMAGING — CT CT PELVIS W/ CM
1 of 2 series · 15 of 32 positions shown, 19 images · IV contrast (omnipaque)
Comparison: none

CLINICAL DATA: Nausea.  Vomiting.  Unable to eat.  Followup small bowel obstruction. 
 ABDOMEN CT WITH CONTRAST:
TECHNIQUE: Multidetector CT imaging of the abdomen was performed following the standard protocol during bolus administration of intravenous contrast.
 Contrast:  100 cc Omnipaque 300
 A series of scans of the entire abdomen are made and are compared to previous studies of 04/09/05.  There is again seen the homogeneous low density of the liver suggesting some fatty infiltration.  The gallbladder has been removed.  The common bile duct, pancreas, and spleen are normal.  The adrenal glands and kidneys are normal.  No abnormal fluid or mass is seen in the upper abdomen.  There are no abnormal lymph nodes.  There is no obstruction of the large or small bowel.  The appendix is visualized and appears normal.  Bones of the lower thoracic and upper lumbar spine are normal.
TECHNIQUE: Multidetector CT imaging of the pelvis was performed following the standard protocol during bolus administration of intravenous contrast.
 CT scan of the pelvis utilizing the same contrast as given for the abdomen show the uterus to have been removed.  No free fluid or air is seen within the abdomen.  The oral contrast passed throughout the bowel to the rectum with no obstruction and no evidence of abscess or diverticulitis.   The appendix is filled with contrast and appears normal.  The bones of the pelvis and lower lumbar spine are normal.

[Series 2: abd/pelv with 5.0 b30f st · axial · 0.69mm/px · z∈[+769,+1209]mm · 15 of 96 slices shown, 19 images]
[im 4/96  soft-tissue]
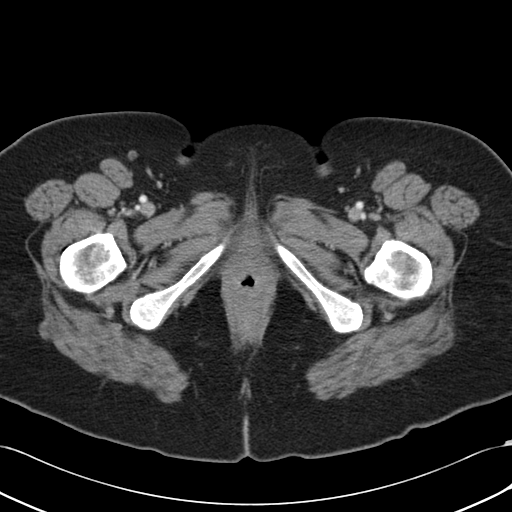
[im 4/96  bone]
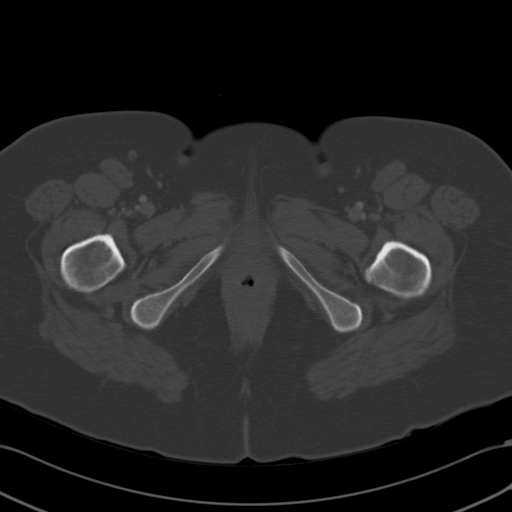
[im 12/96  soft-tissue]
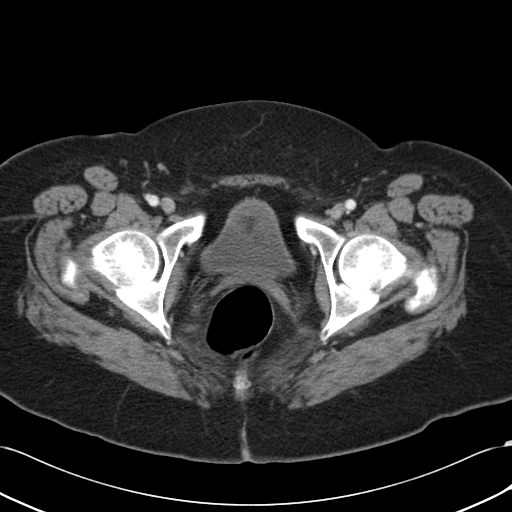
[im 20/96  soft-tissue]
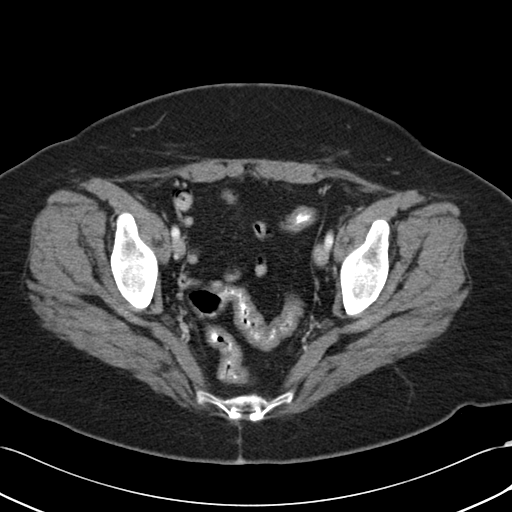
[im 28/96  soft-tissue]
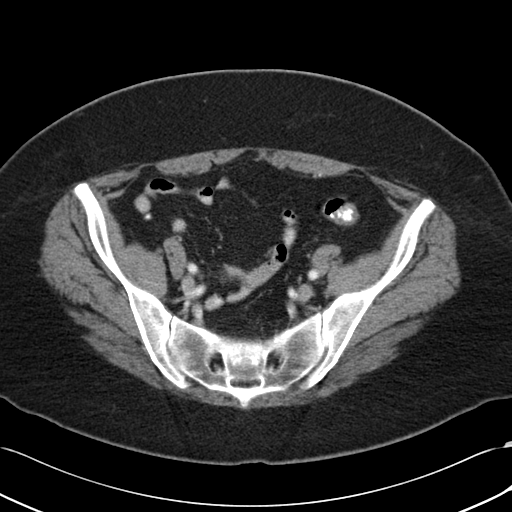
[im 32/96  soft-tissue]
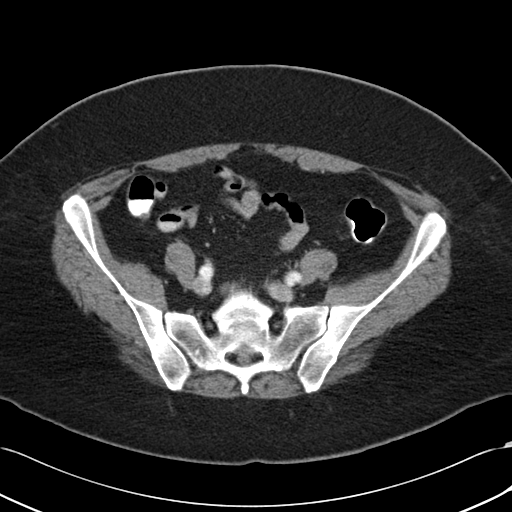
[im 40/96  soft-tissue]
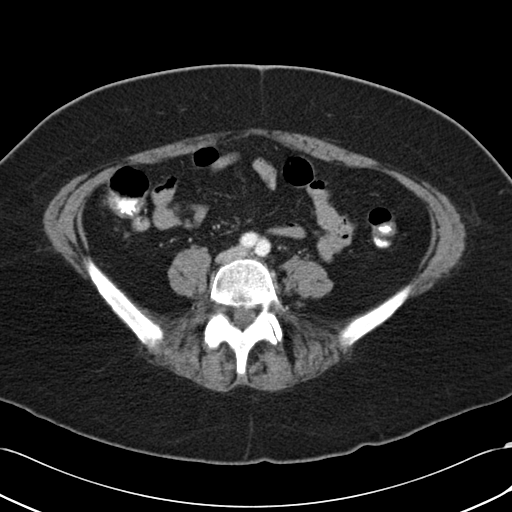
[im 48/96  soft-tissue]
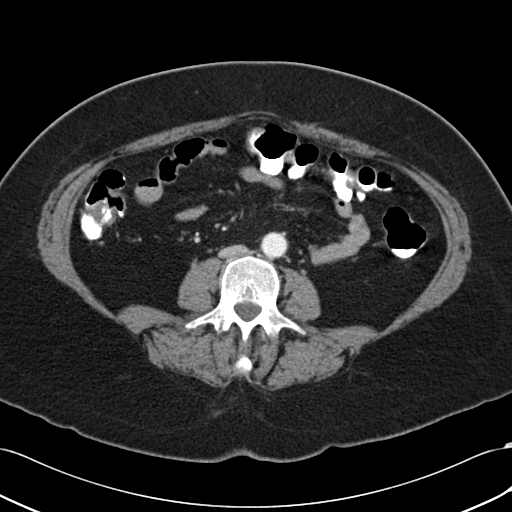
[im 56/96  soft-tissue]
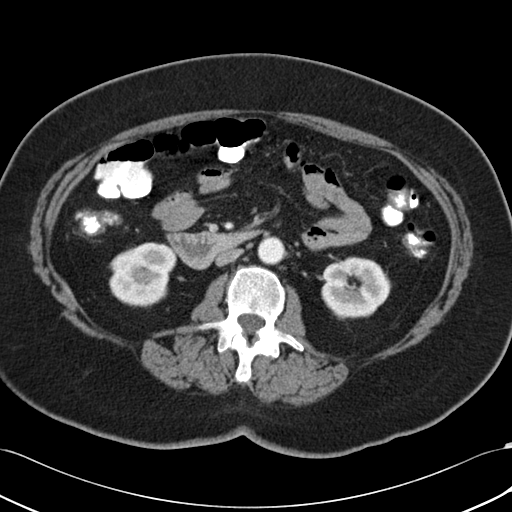
[im 64/96  soft-tissue]
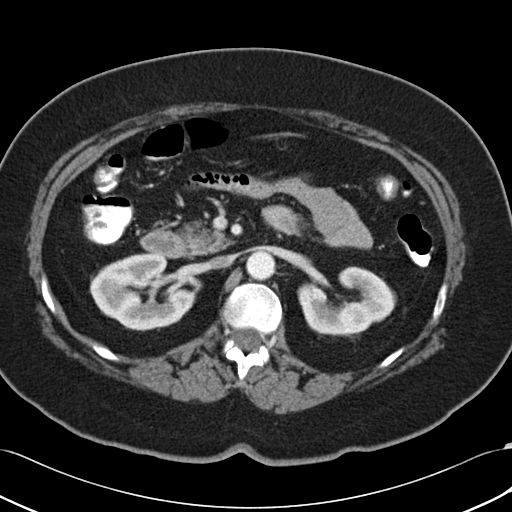
[im 64/96  bone]
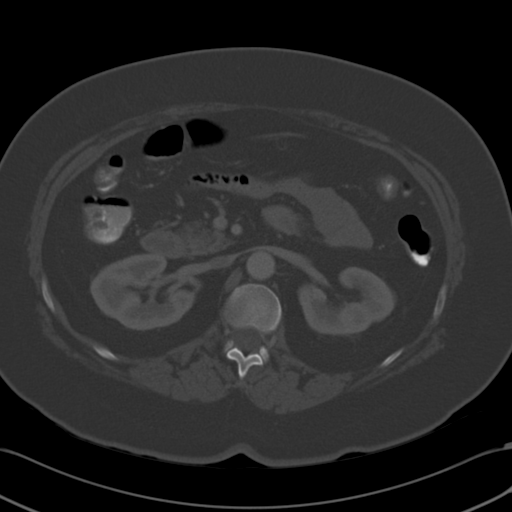
[im 68/96  soft-tissue]
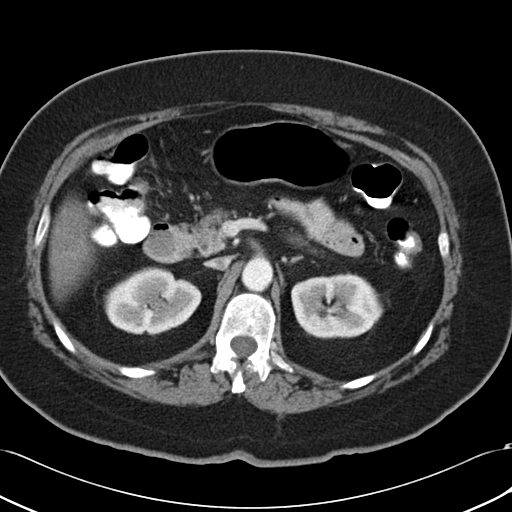
[im 76/96  soft-tissue]
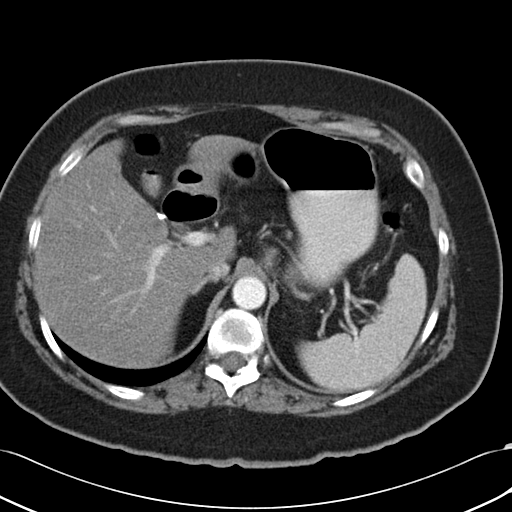
[im 80/96  lung]
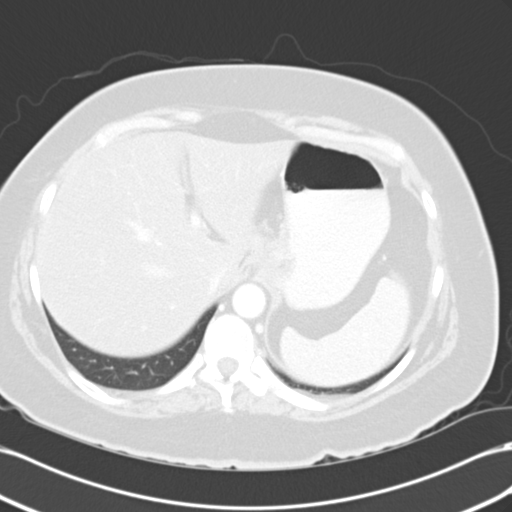
[im 84/96  soft-tissue]
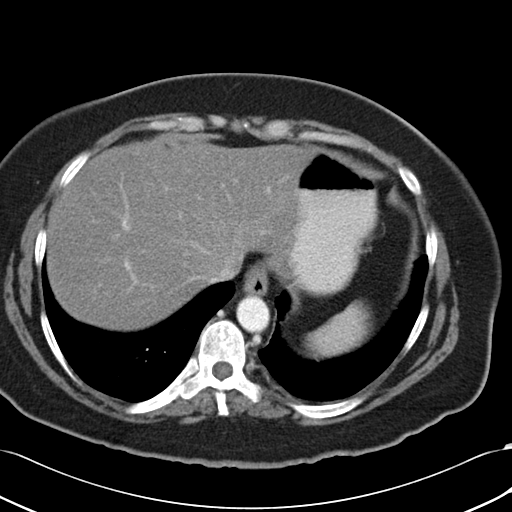
[im 84/96  lung]
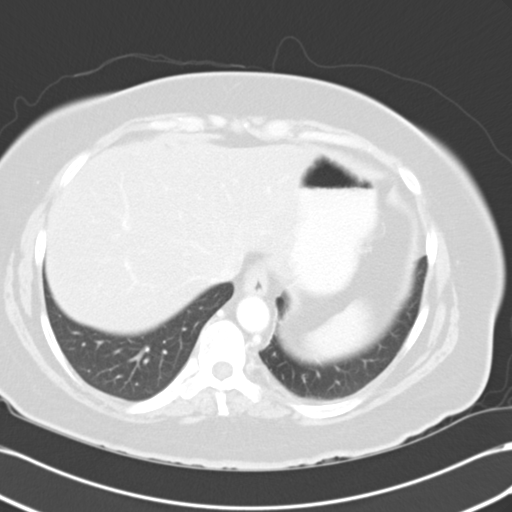
[im 88/96  lung]
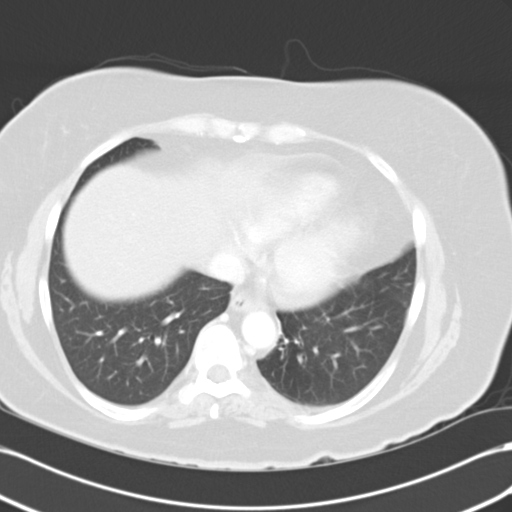
[im 92/96  soft-tissue]
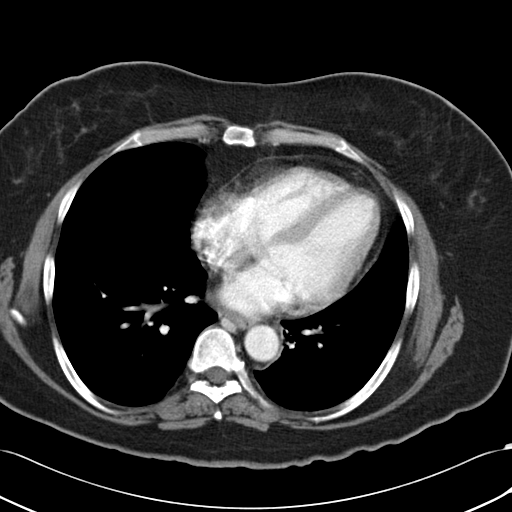
[im 92/96  lung]
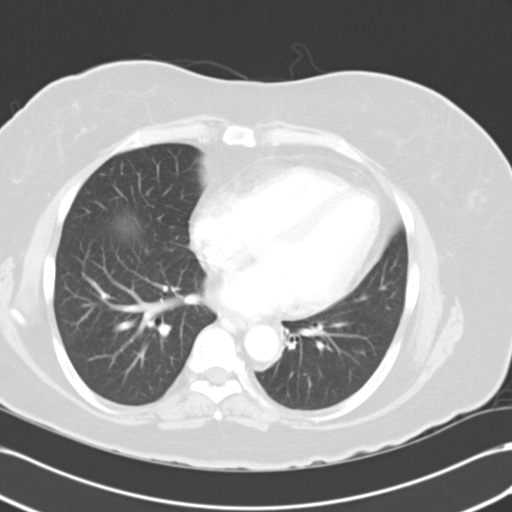

[15 of 32 positions shown; findings below may reference images not displayed]

IMPRESSION: The liver has low density suggestive of fatty infiltration status-post cholecystectomy.  Otherwise normal CT scan of the abdomen with contrast. 
 PELVIS CT WITH CONTRAST:
IMPRESSION: No significant abnormality or interval change CT scan of the pelvis.  There is no obstruction of the bowel.

## 2007-03-05 IMAGING — CR DG ABDOMEN 1V
1 series · 1 of 1 positions shown · non-contrast
Comparison: Abdominal CT 04/09/05 and radiographs 04/10/05.

CLINICAL DATA: Mid abdominal pain. Small bowel obstruction.
 [id number] VIEW:

[view not recorded]
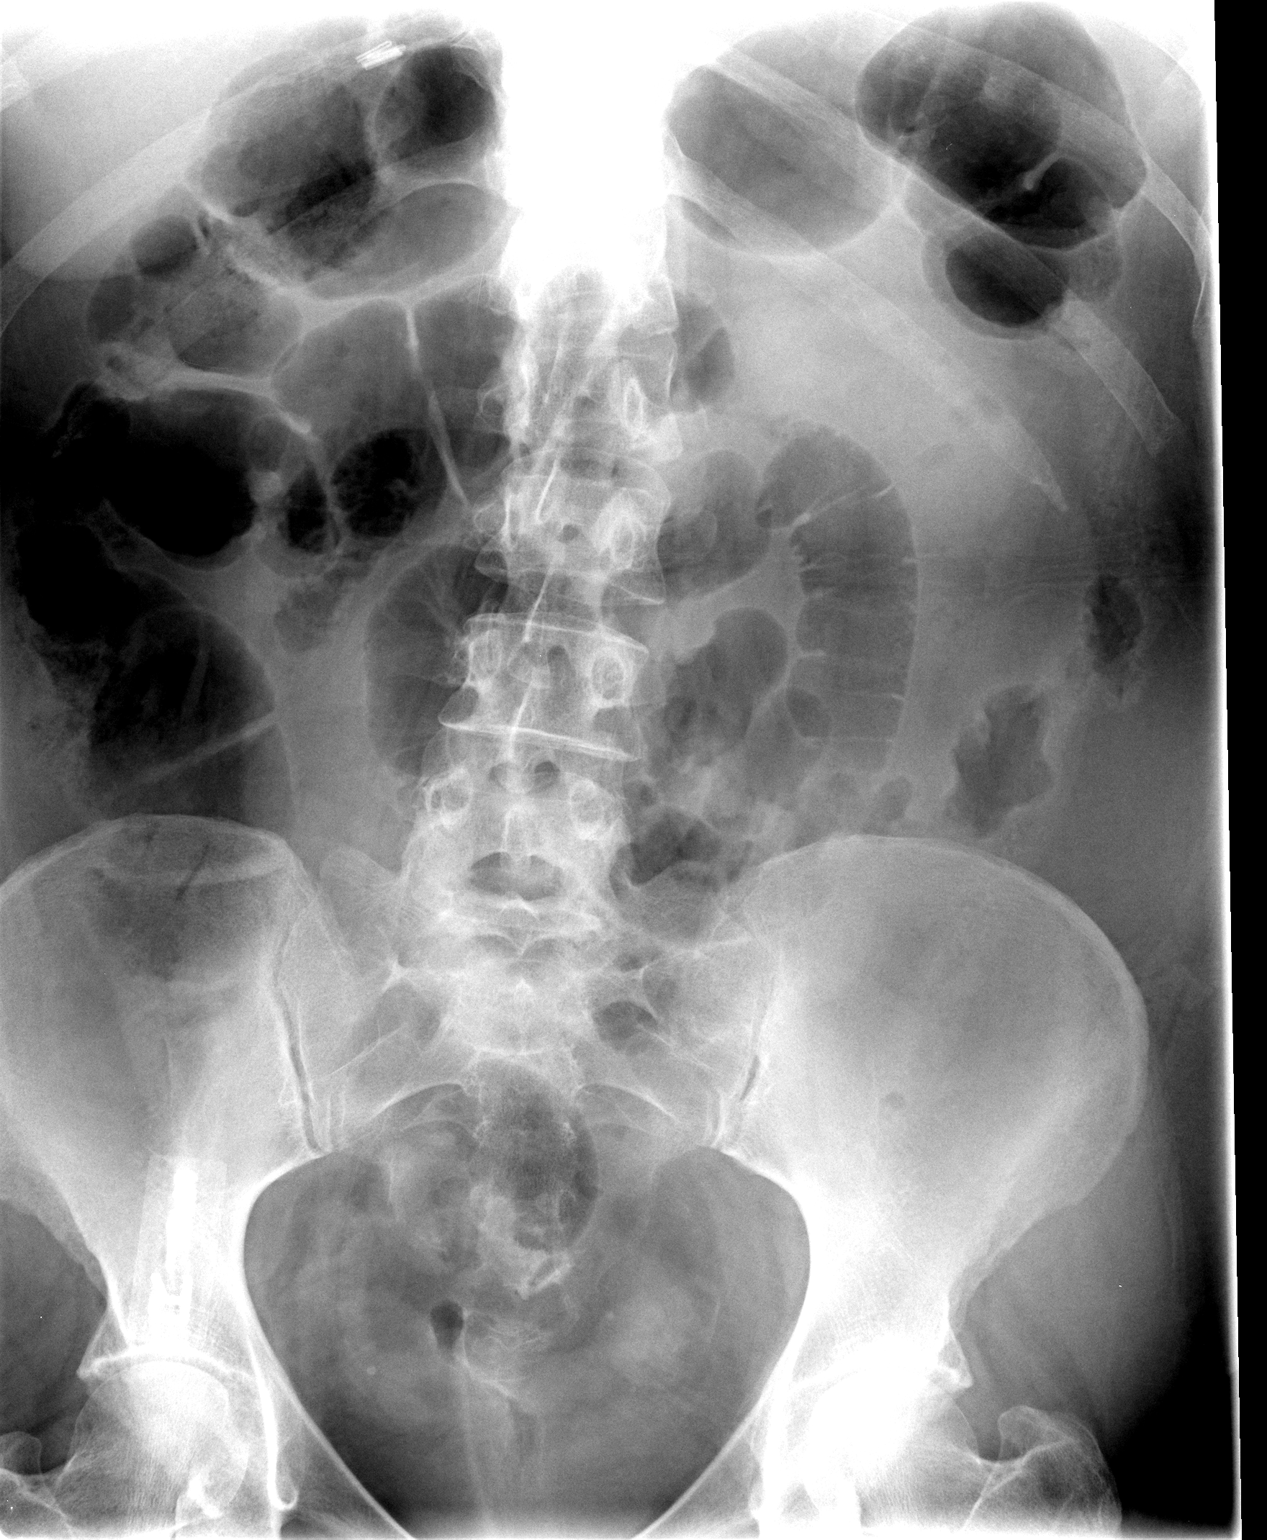

[1 of 1 positions shown; findings below may reference images not displayed]

FINDINGS: There is less gaseous distention of the stomach and small bowel.  Air is noted throughout the colon.  Findings remain most compatible with an ileus.  There is no supine evidence of free intraperitoneal air.
IMPRESSION: Improving adynamic ileus.

## 2007-03-07 ENCOUNTER — Ambulatory Visit: Payer: Self-pay | Admitting: Internal Medicine

## 2007-03-07 ENCOUNTER — Inpatient Hospital Stay (HOSPITAL_COMMUNITY): Admission: EM | Admit: 2007-03-07 | Discharge: 2007-03-10 | Payer: Self-pay | Admitting: Emergency Medicine

## 2007-03-08 IMAGING — CR DG ABDOMEN ACUTE W/ 1V CHEST
3 series · 3 of 3 positions shown · non-contrast
Comparison: none

CLINICAL DATA: Nausea, vomiting, diarrhea, burping
 ACUTE ABDOMINAL SERIES WITH CHEST:

[view not recorded (1 of 3)]
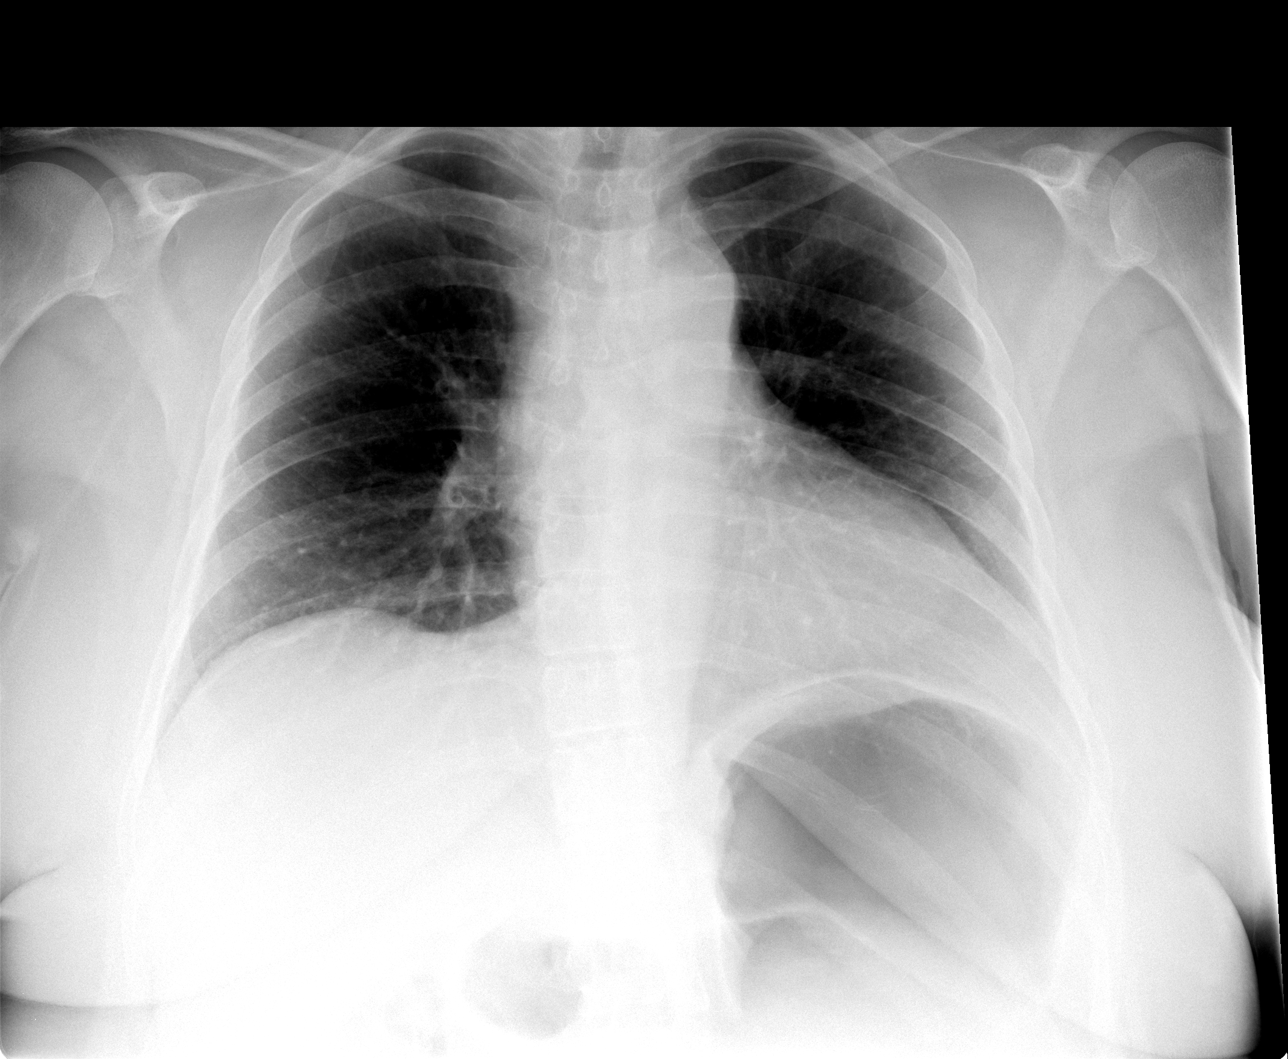

[view not recorded (2 of 3)]
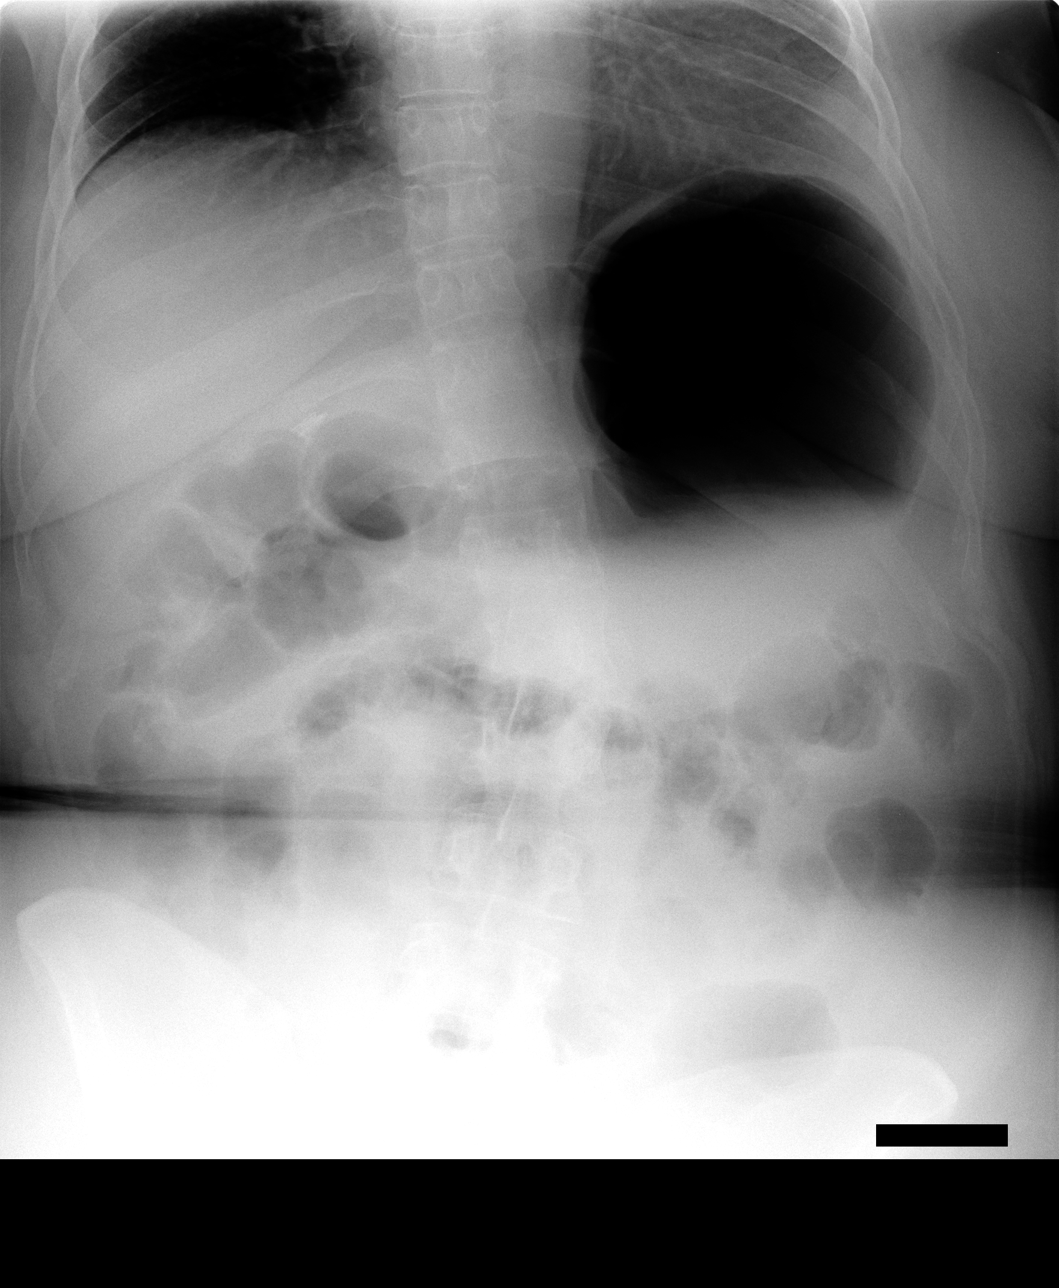

[view not recorded (3 of 3)]
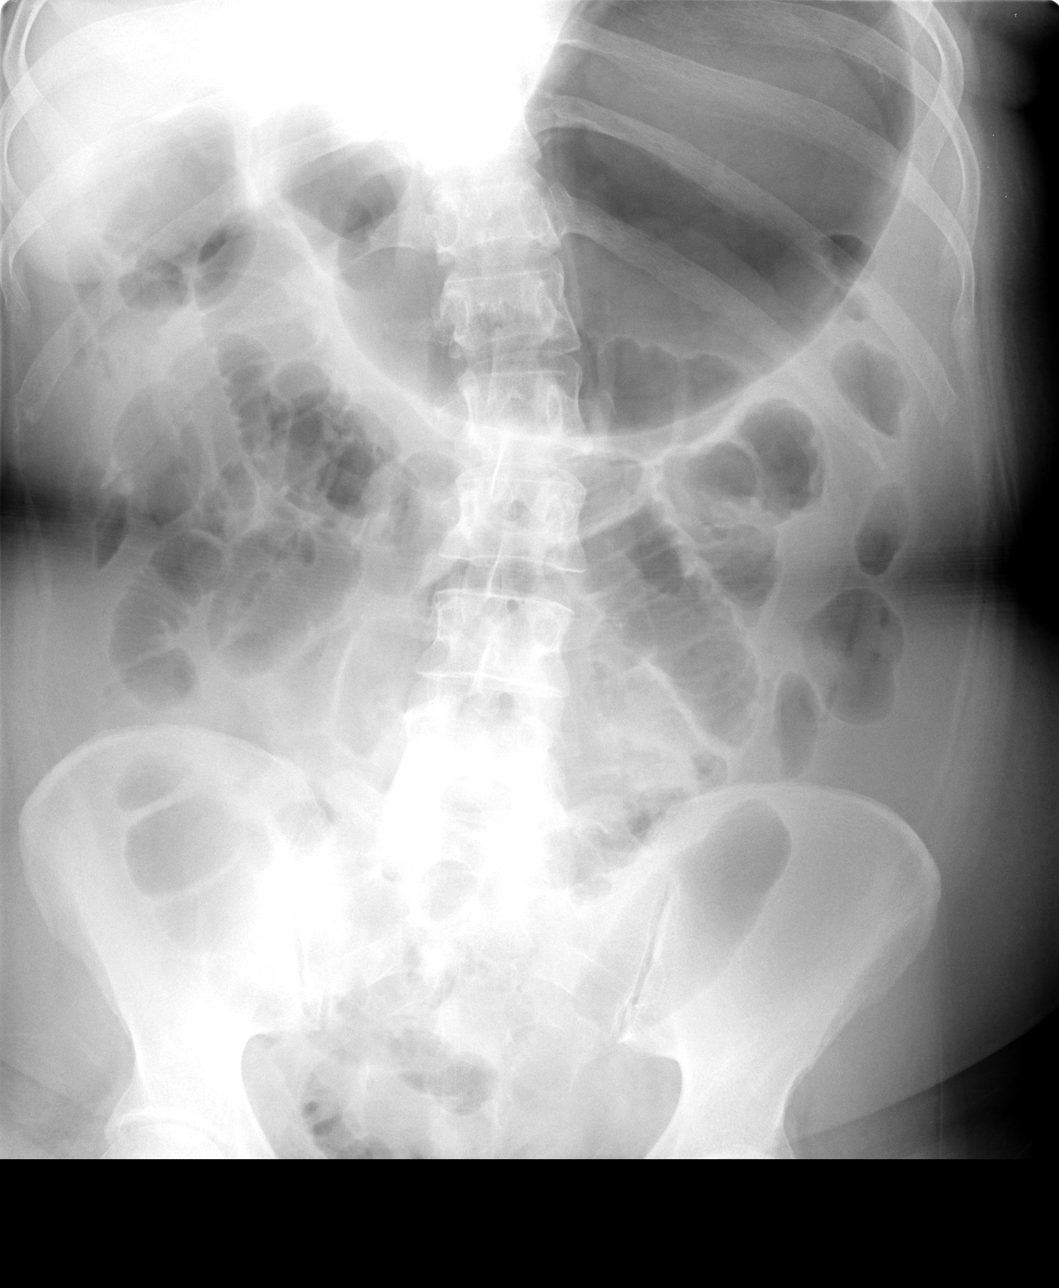

[3 of 3 positions shown; findings below may reference images not displayed]

FINDINGS: There is marked gastric distension.  There is also moderate proximal small bowel distension.  These changes may be secondary to partial small bowel obstruction.  There is no free peritoneal air.  The upright chest view demonstrates mild cardiomegaly.  There are no infiltrates or edematous changes.
IMPRESSION: 1.Marked gastric distension and moderate proximal small bowel distension ? question partial small bowel obstruction.  No free air.
 2.Cardiomegaly.

## 2007-03-09 IMAGING — CR DG ABDOMEN 2V
3 series · 3 of 3 positions shown · non-contrast
Comparison: Supine and erect views of the abdomen are compared to films from 04/14/05.

CLINICAL DATA: Upper abdominal pain.  Nausea and vomiting. 
 ABDOMEN - 2 VIEW - 04/15/05:

[w abdomen upright *]
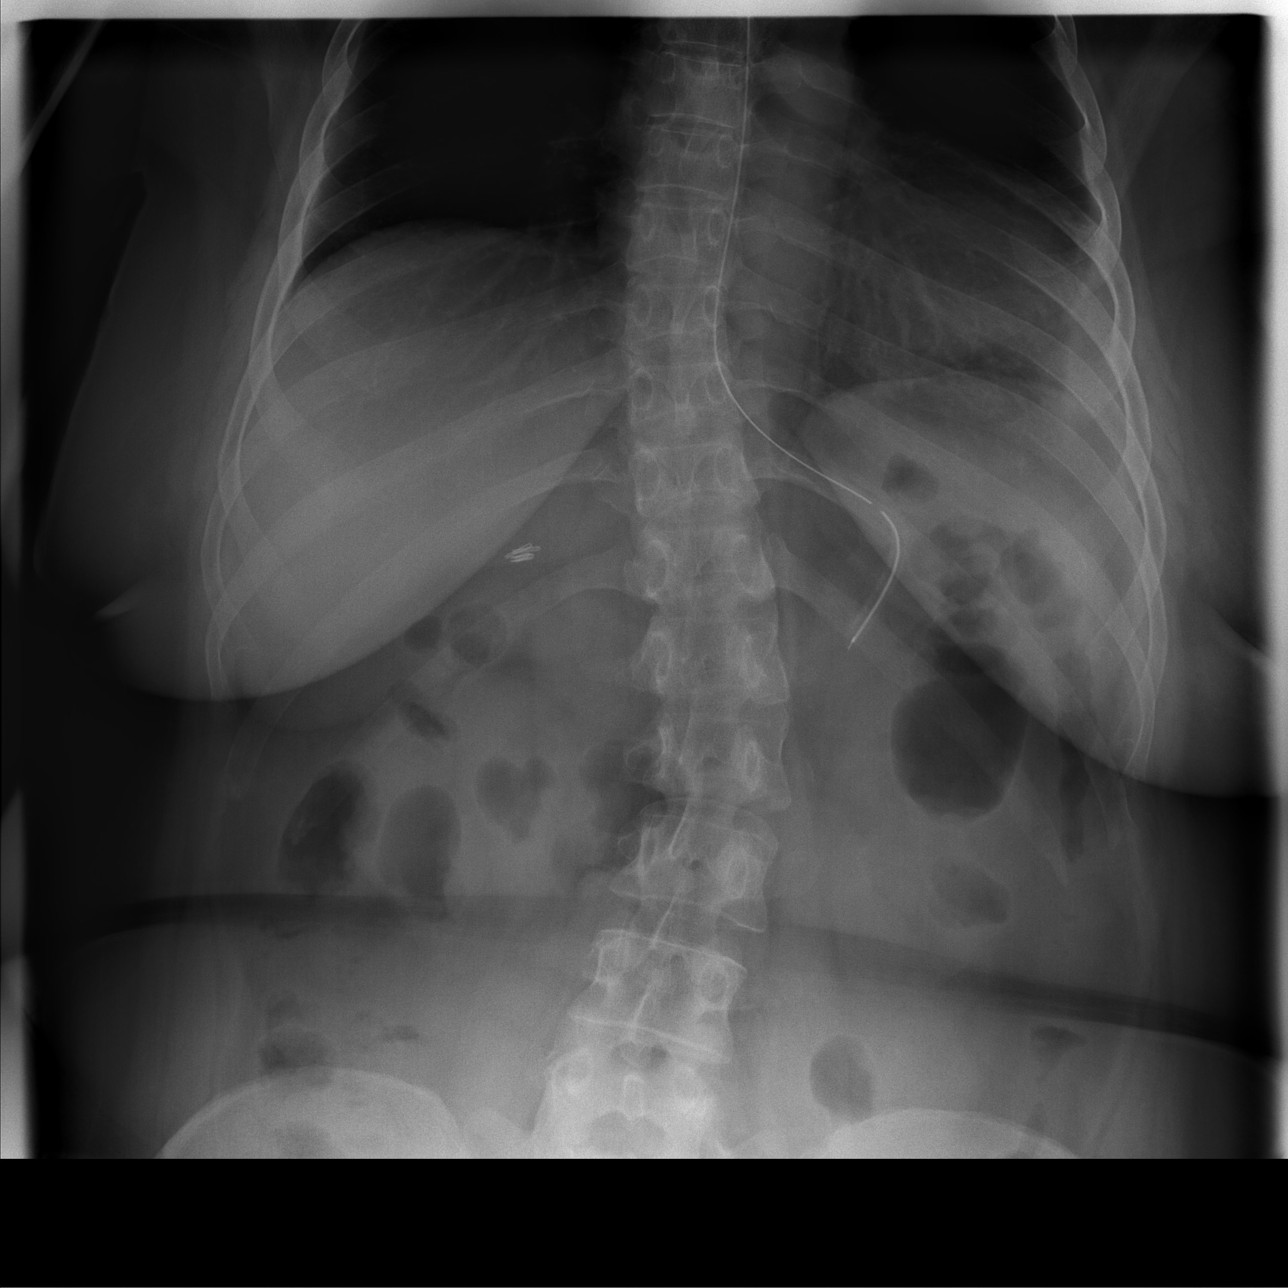

[t abdomen supine]
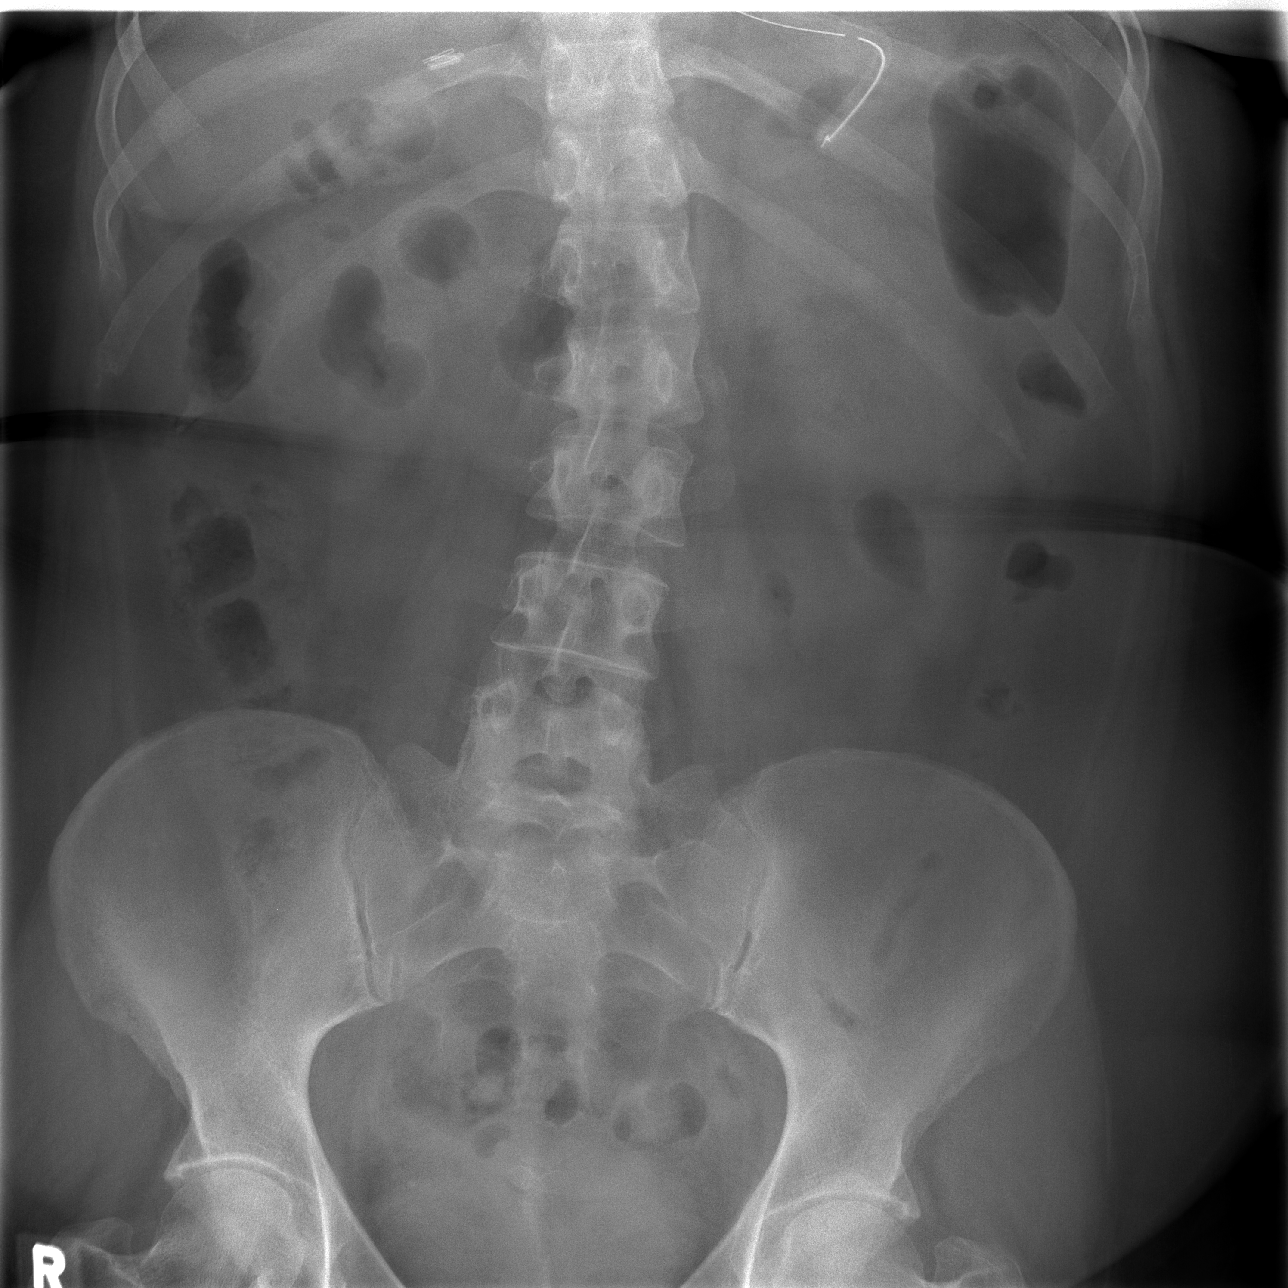

[t abdomen supine *]
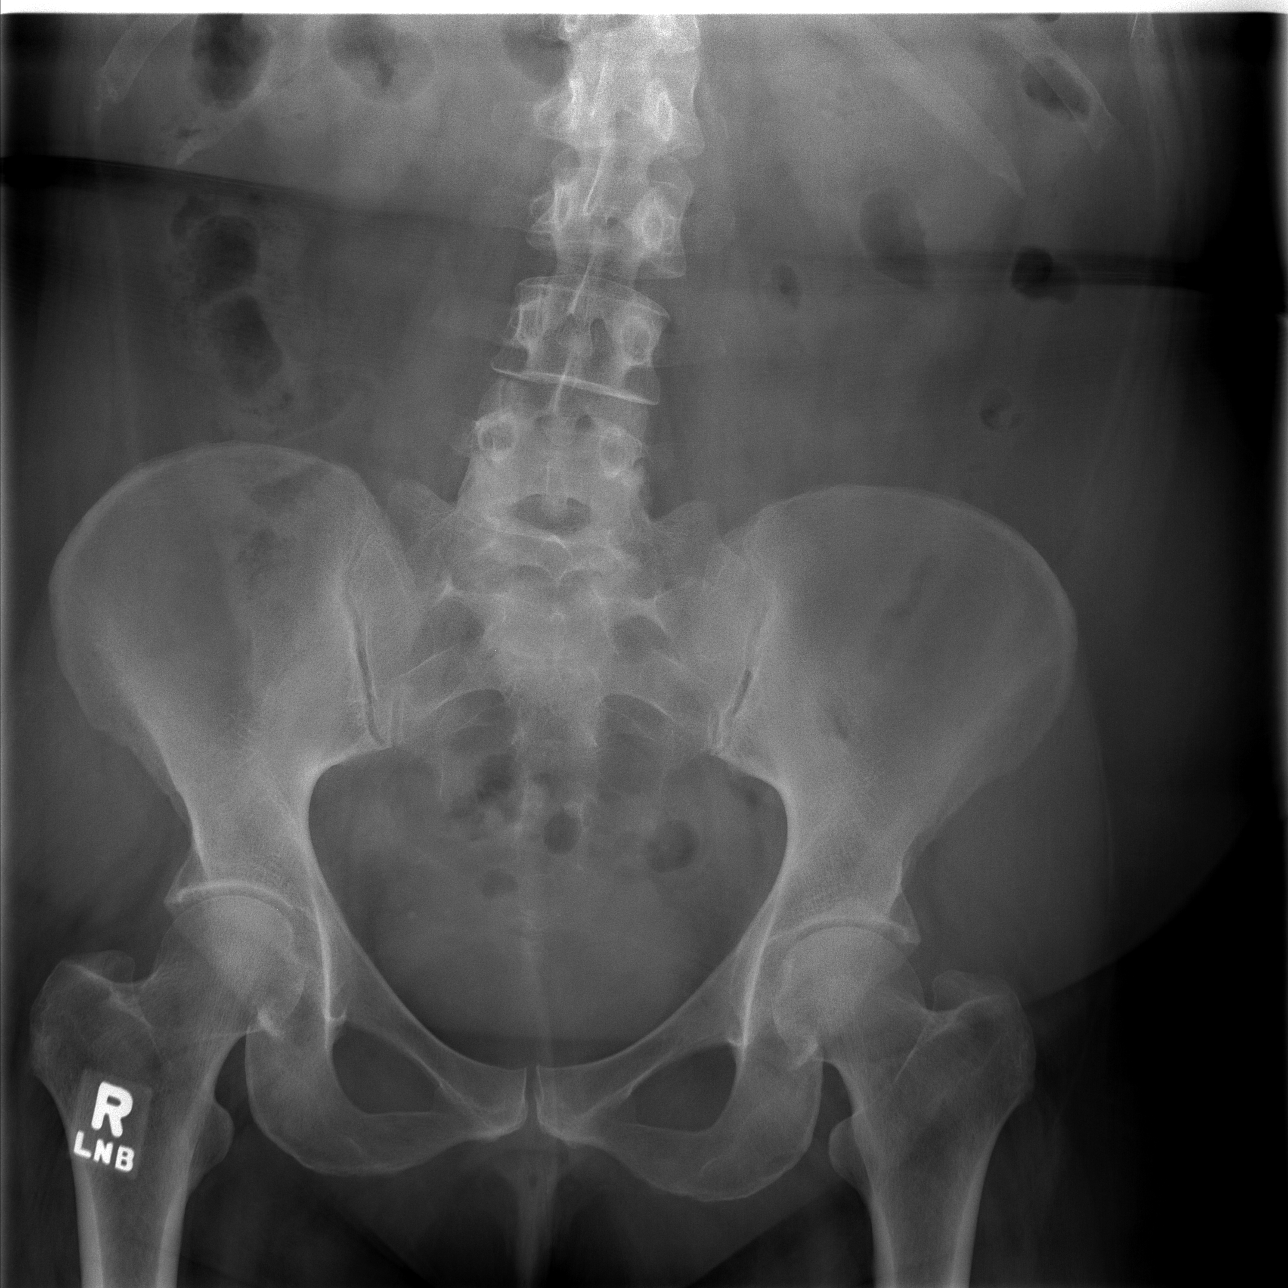

[3 of 3 positions shown; findings below may reference images not displayed]

A nasogastric tube is now present, and the previously noted gaseous distention of the stomach has resolved.  Overall the bowel gas pattern has improved with less small bowel gas pattern.  No obstruction or free air is seen.
IMPRESSION: Improvement with resolution of gaseous distention of the stomach and decrease in small bowel gas.  Nasogastric tube present.

## 2007-03-10 IMAGING — CR DG ABDOMEN ACUTE W/ 1V CHEST
5 series · 5 of 5 positions shown · non-contrast
Comparison: Two-view abdomen x-rays yesterday an acute abdomen series 04/14/2005.

CLINICAL DATA: Followup small bowel obstruction. Persistent nausea and abdominal
pain.

ACUTE ABDOMEN SERIES  (2 VIEW  ABDOMEN AND 1 VIEW CHEST)  04/16/2005:

[view not recorded (1 of 5)]
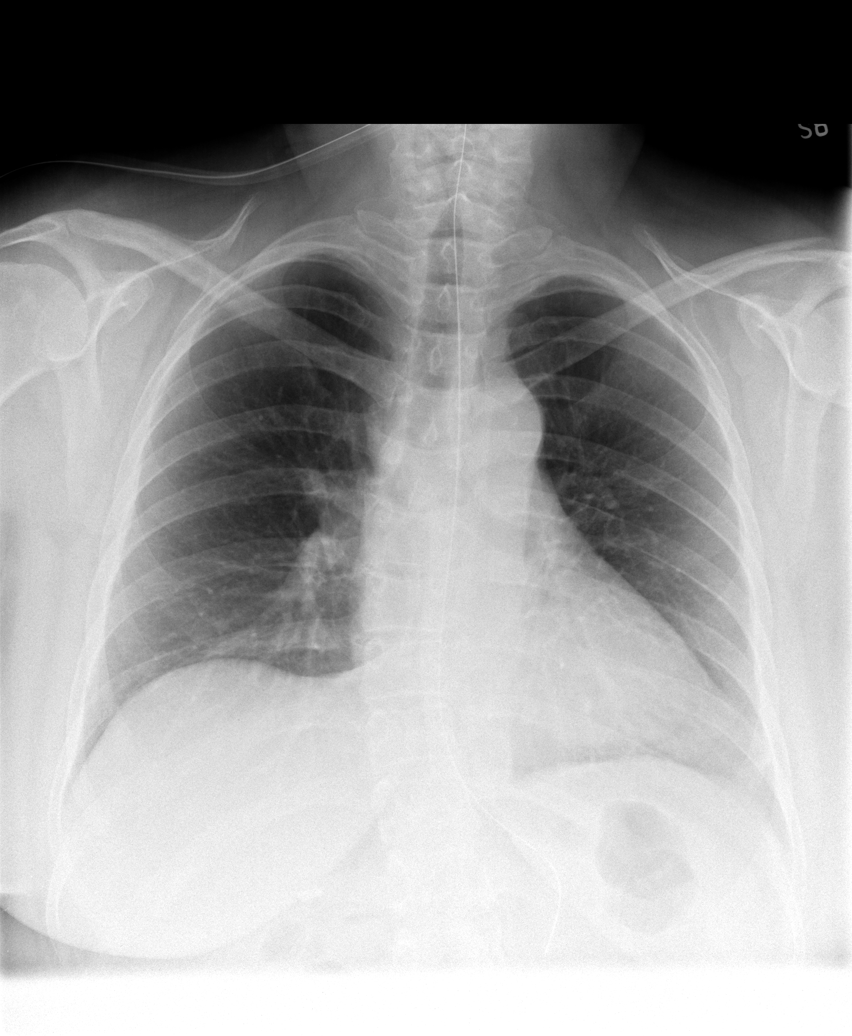

[view not recorded (2 of 5)]
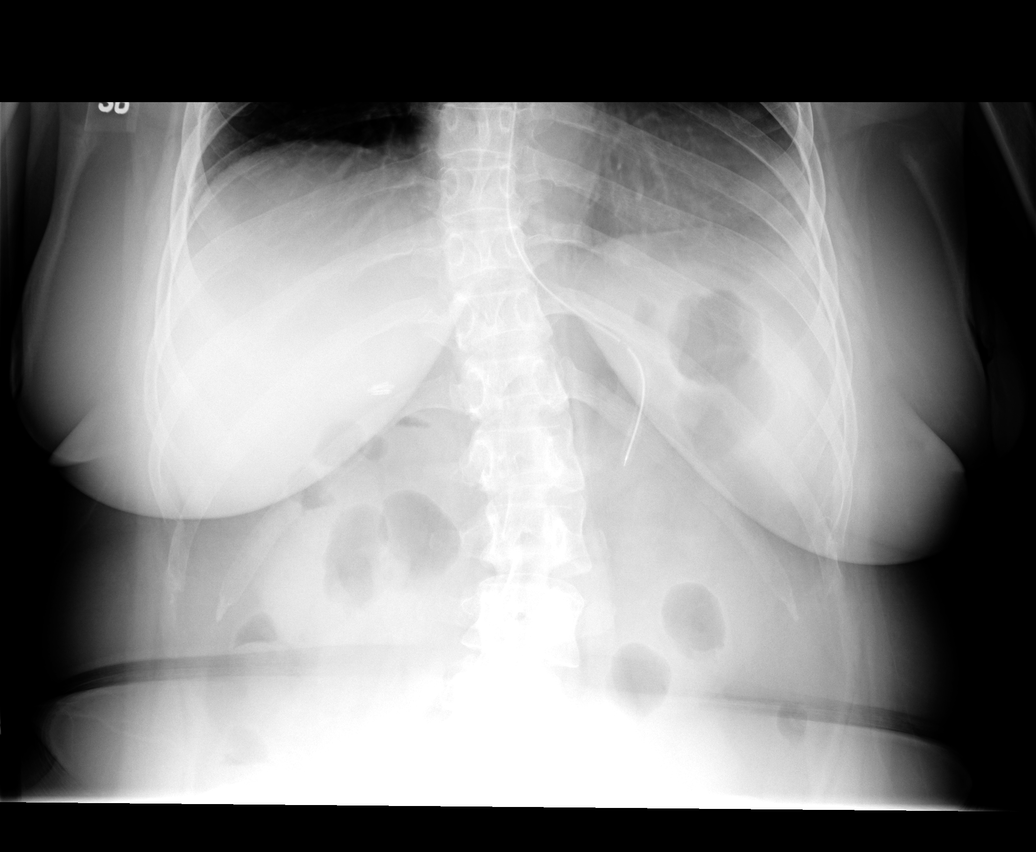

[view not recorded (3 of 5)]
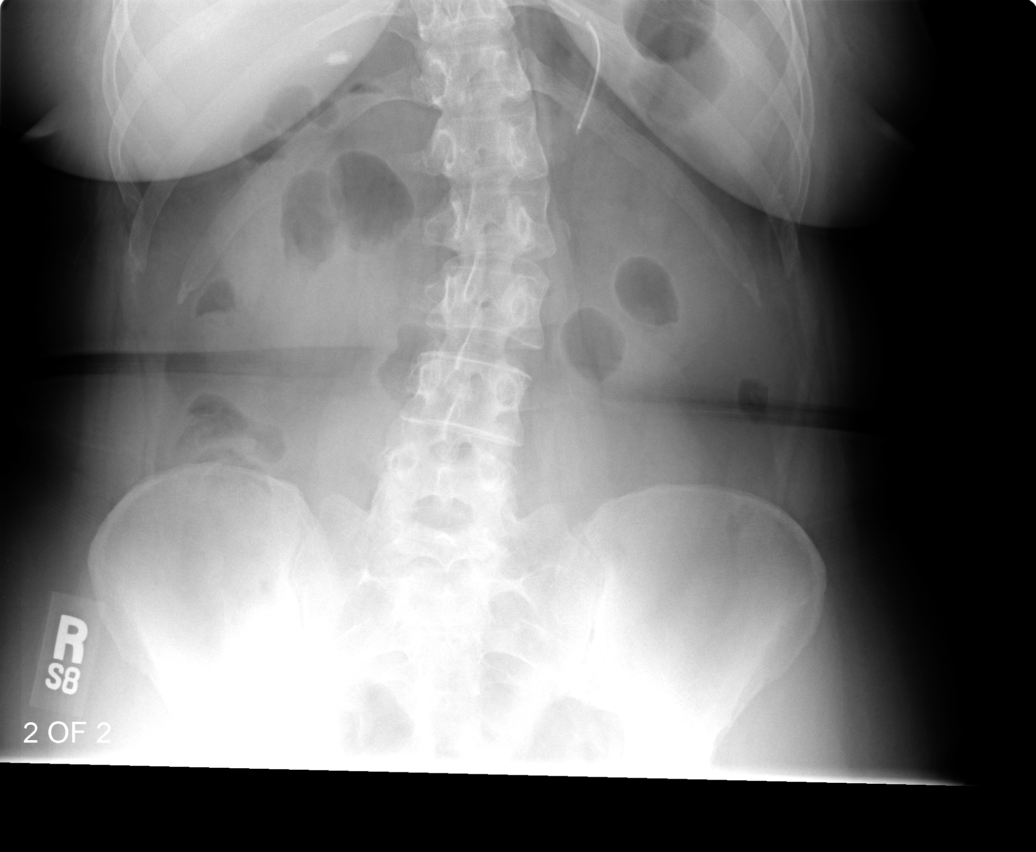

[view not recorded (4 of 5)]
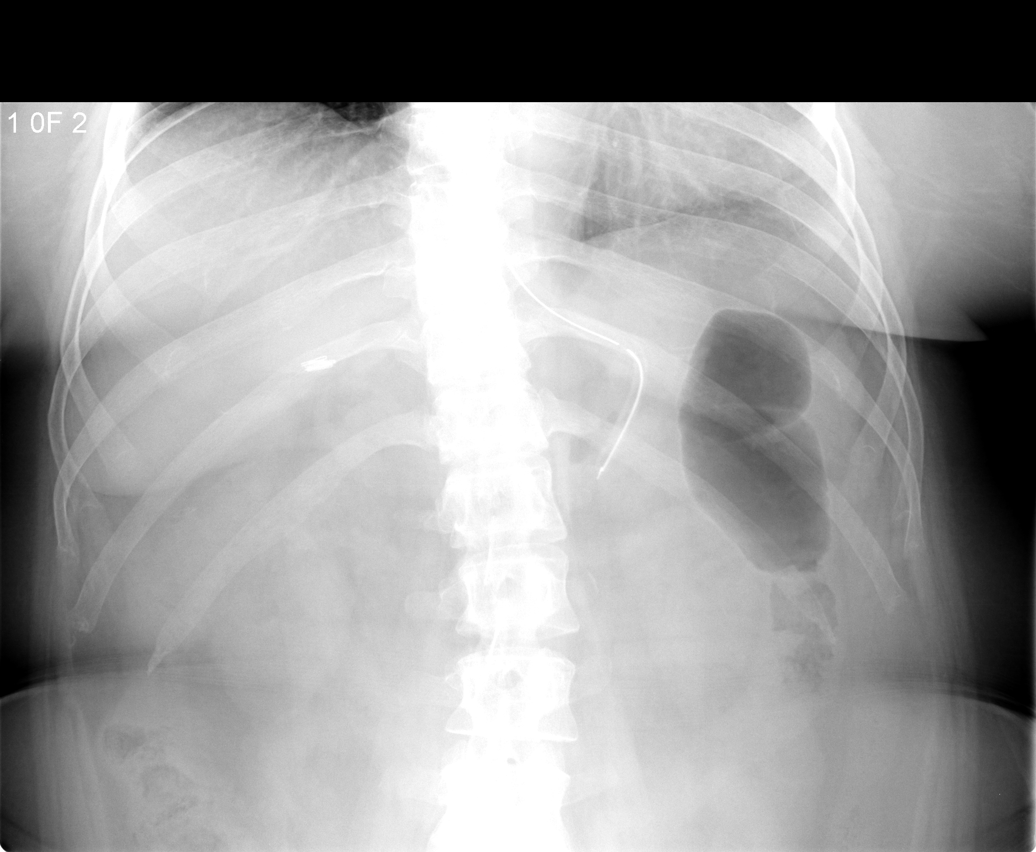

[view not recorded (5 of 5)]
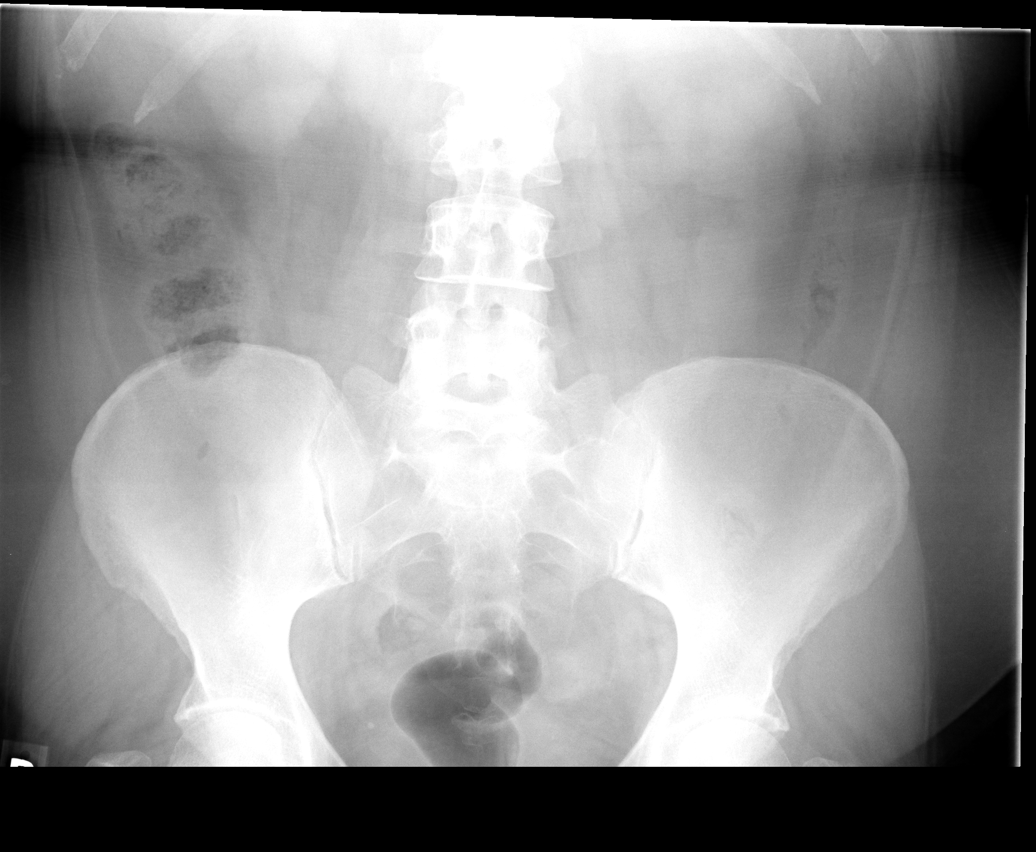

[5 of 5 positions shown; findings below may reference images not displayed]

FINDINGS: The nasogastric tube tip remains in the body the stomach. The stomach
and small bowel are decompressed and the bowel gas pattern is now normal. Gas
and stool are noted throughout a normal caliber colon. There is no evidence of
free air on erect view. Surgical clips in the right upper quadrant from prior
cholecystectomy are noted. Thoracolumbar scoliosis convex left also again noted.

The accompanying PA chest x-ray shows a normal cardiomediastinal silhouette. The
lungs appear clear.
IMPRESSION: 1. Resolution of the proximal partial small bowel obstruction. The bowel gas
pattern is now normal.
2. No free intraperitoneal air.
3. No acute cardiopulmonary disease.

## 2007-03-11 ENCOUNTER — Inpatient Hospital Stay (HOSPITAL_COMMUNITY): Admission: EM | Admit: 2007-03-11 | Discharge: 2007-03-13 | Payer: Self-pay | Admitting: Internal Medicine

## 2007-03-11 ENCOUNTER — Encounter: Payer: Self-pay | Admitting: Emergency Medicine

## 2007-03-12 ENCOUNTER — Ambulatory Visit: Payer: Self-pay | Admitting: Internal Medicine

## 2007-03-14 IMAGING — CR DG HIP (WITH OR WITHOUT PELVIS) 2-3V*L*
3 series · 3 of 3 positions shown · non-contrast
Comparison: none

CLINICAL DATA: Small bowel obstruction, diabetes. Fall.  
 LEFT HIP - 3 VIEW: 
 No prior studies.

[t pelvis a.p.]
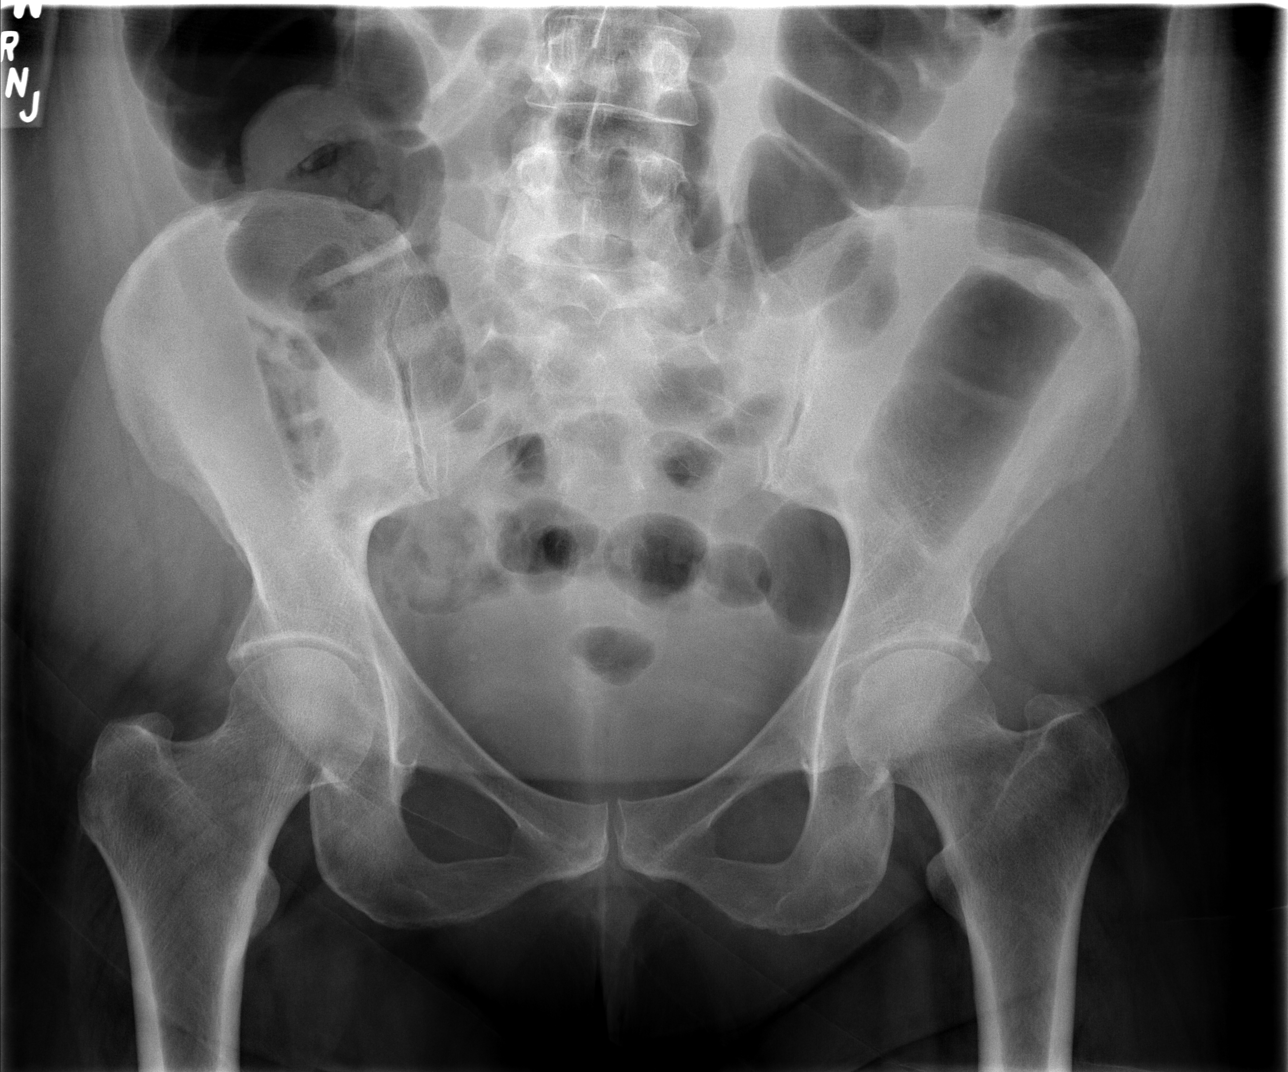

[t hip ap left]
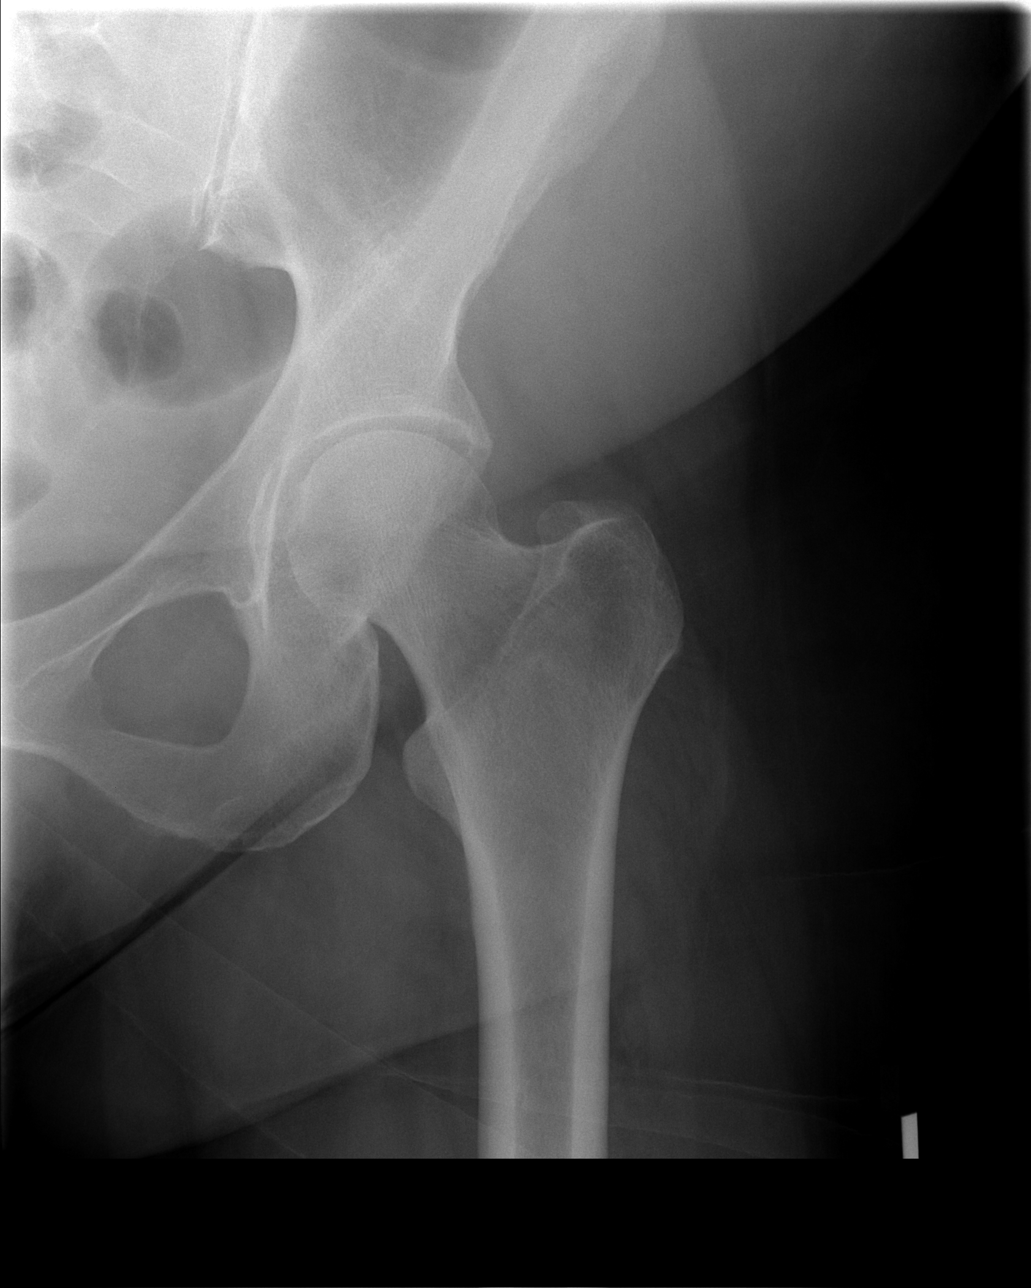

[t hip frog leg left]
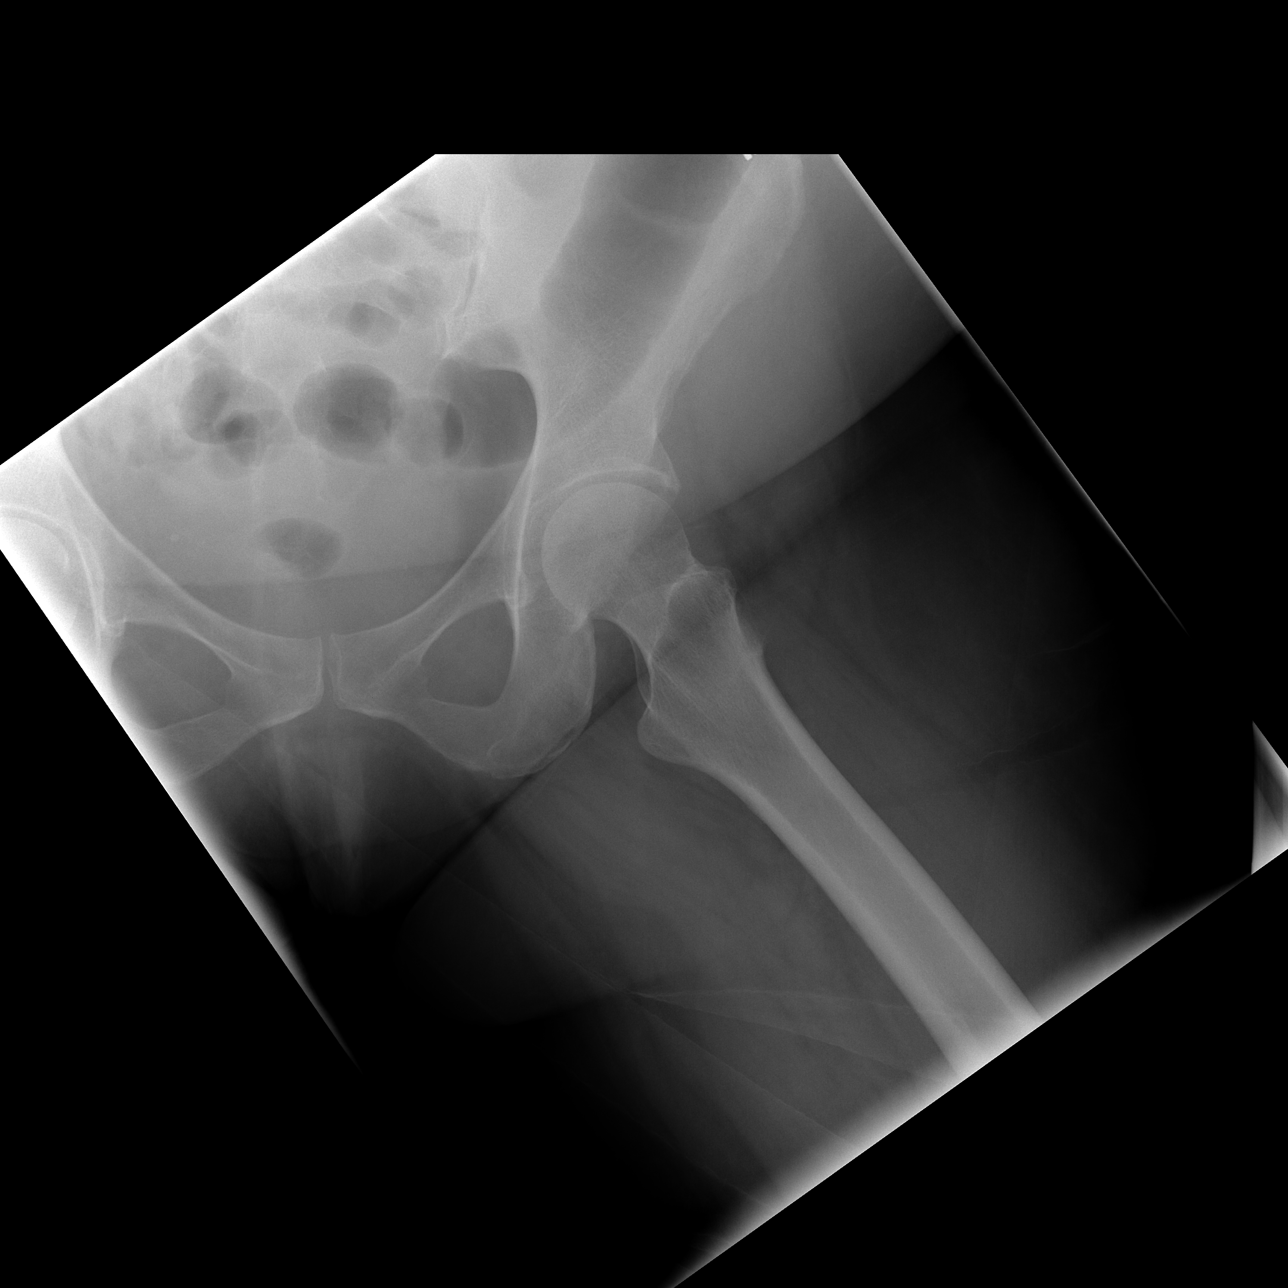

[3 of 3 positions shown; findings below may reference images not displayed]

FINDINGS: There is no evidence of hip fracture or dislocation.  There is no evidence of arthropathy or other focal bone abnormality.
IMPRESSION: Negative.

## 2007-03-20 ENCOUNTER — Encounter (INDEPENDENT_AMBULATORY_CARE_PROVIDER_SITE_OTHER): Payer: Self-pay | Admitting: Internal Medicine

## 2007-03-27 ENCOUNTER — Encounter (INDEPENDENT_AMBULATORY_CARE_PROVIDER_SITE_OTHER): Payer: Self-pay | Admitting: Internal Medicine

## 2007-03-27 DIAGNOSIS — Z8542 Personal history of malignant neoplasm of other parts of uterus: Secondary | ICD-10-CM

## 2007-03-27 DIAGNOSIS — E119 Type 2 diabetes mellitus without complications: Secondary | ICD-10-CM

## 2007-03-27 DIAGNOSIS — K219 Gastro-esophageal reflux disease without esophagitis: Secondary | ICD-10-CM | POA: Insufficient documentation

## 2007-03-27 DIAGNOSIS — R809 Proteinuria, unspecified: Secondary | ICD-10-CM | POA: Insufficient documentation

## 2007-03-27 DIAGNOSIS — K5730 Diverticulosis of large intestine without perforation or abscess without bleeding: Secondary | ICD-10-CM | POA: Insufficient documentation

## 2007-03-27 HISTORY — DX: Personal history of malignant neoplasm of other parts of uterus: Z85.42

## 2007-03-27 IMAGING — CR DG ABDOMEN ACUTE W/ 1V CHEST
4 series · 4 of 4 positions shown · non-contrast
Comparison: none

CLINICAL DATA: Abdominal pain.  Nausea and vomiting. 
 ACUTE ABDOMINAL SERIES WITH CHEST - 3 VIEW:
 Cardiomegaly and mild peribronchial thickening again noted.  There is distended stomach and dilated small bowel loops in the upper abdomen.  This may represent focal ileus, but I cannot exclude a developing obstruction.  Consider further evaluation with CT. 
 There is no evidence of pneumoperitoneum.

[w chest pa]
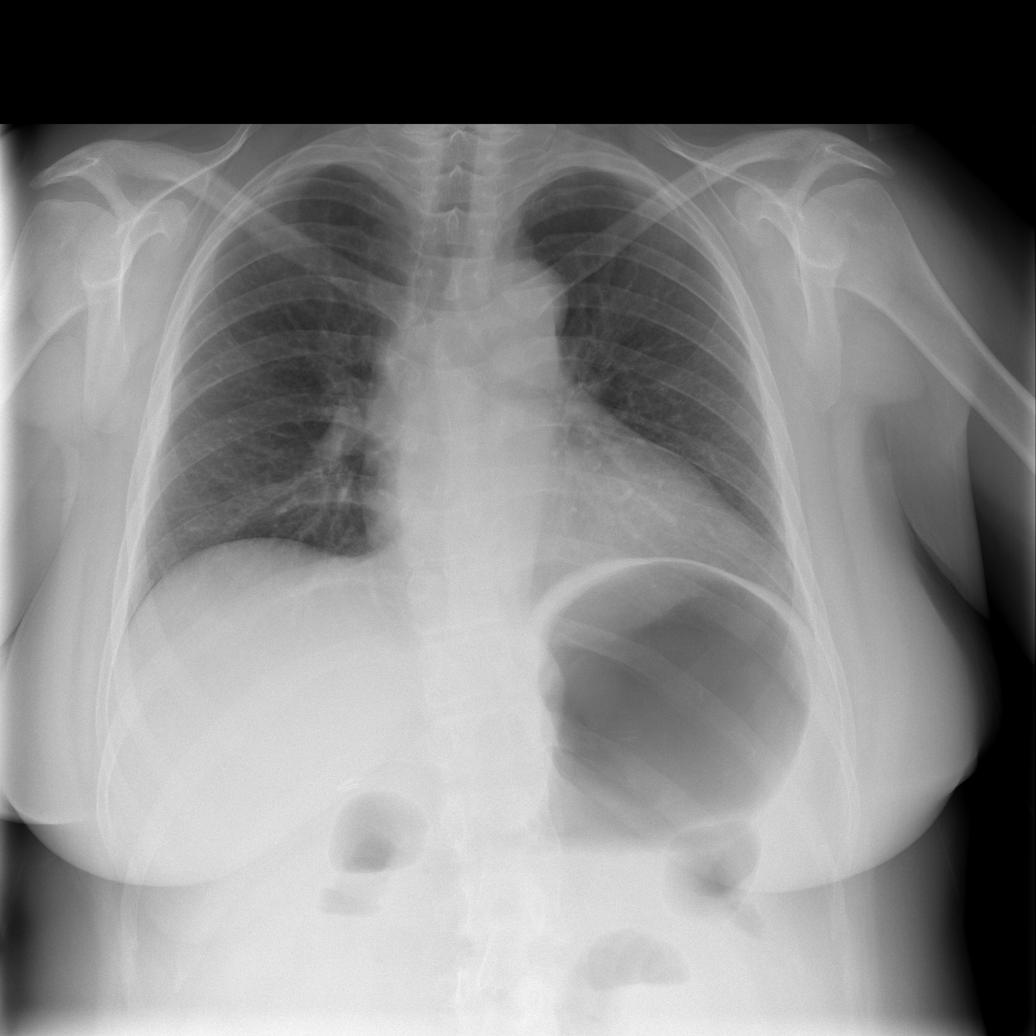

[w abdomen upright *]
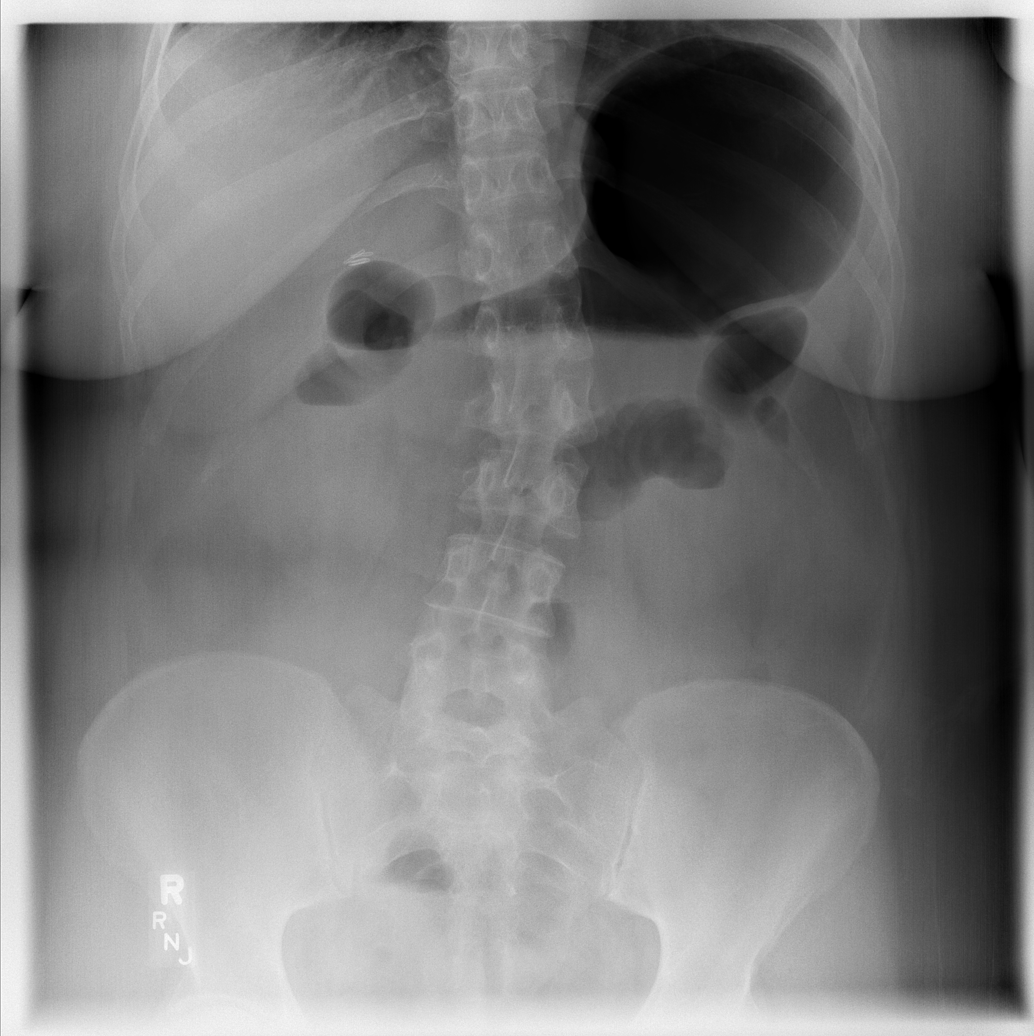

[t abdomen supine (1 of 2)]
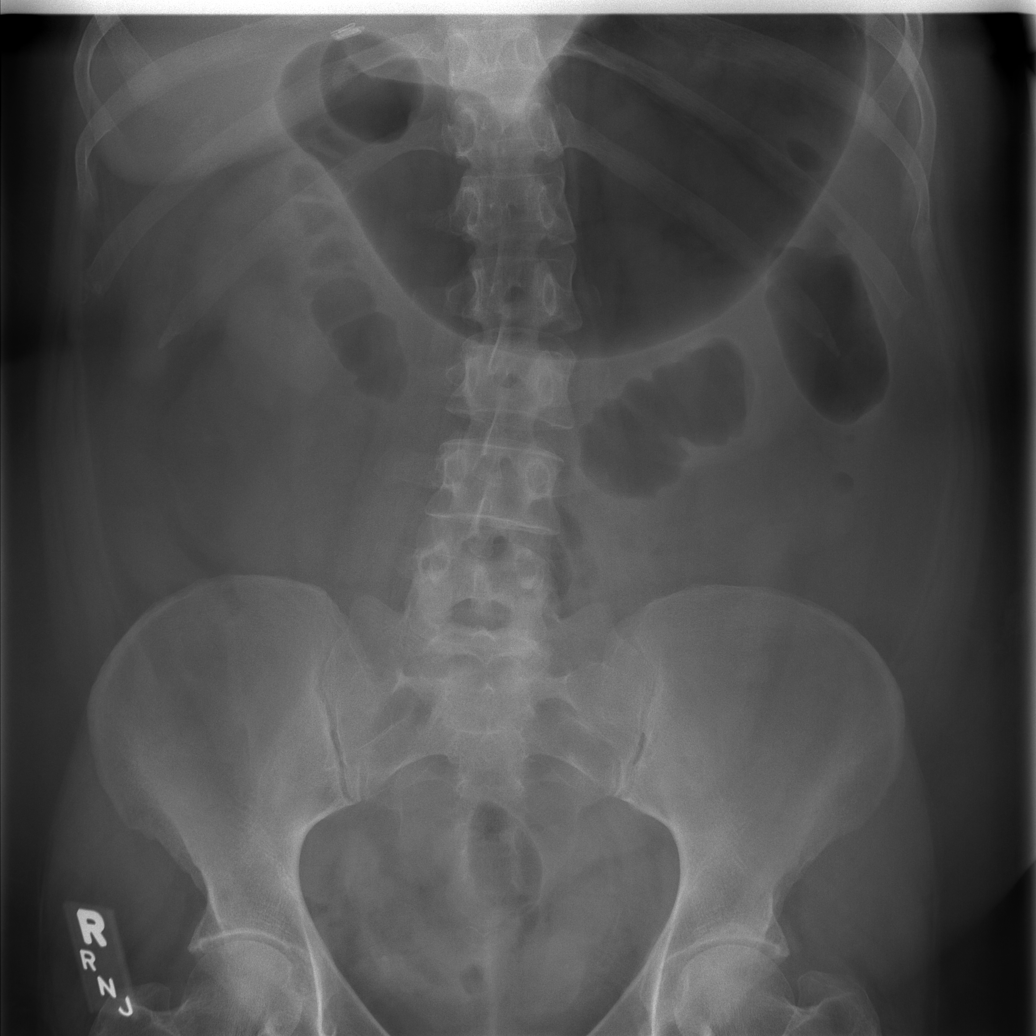

[t abdomen supine (2 of 2)]
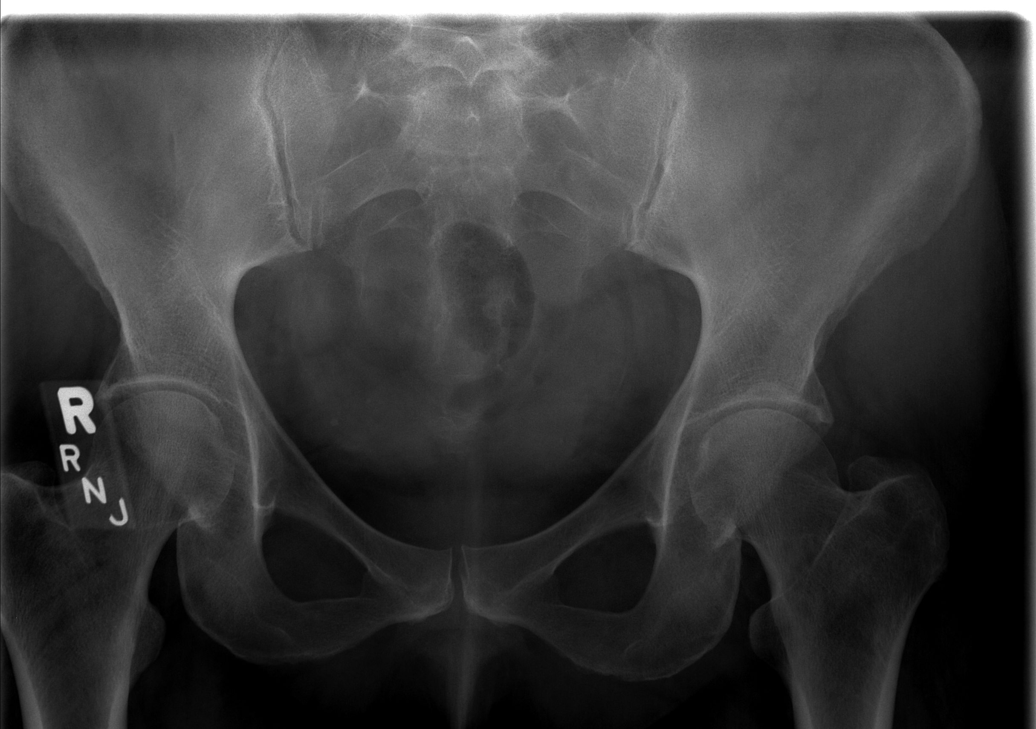

[4 of 4 positions shown; findings below may reference images not displayed]

IMPRESSION: Distended stomach and dilated loop of small bowel, probably duodenum.  Consider further evaluation with CT.

## 2007-03-28 ENCOUNTER — Ambulatory Visit: Payer: Self-pay | Admitting: Family Medicine

## 2007-03-28 DIAGNOSIS — G894 Chronic pain syndrome: Secondary | ICD-10-CM | POA: Insufficient documentation

## 2007-03-31 ENCOUNTER — Telehealth (INDEPENDENT_AMBULATORY_CARE_PROVIDER_SITE_OTHER): Payer: Self-pay | Admitting: Internal Medicine

## 2007-04-01 ENCOUNTER — Telehealth (INDEPENDENT_AMBULATORY_CARE_PROVIDER_SITE_OTHER): Payer: Self-pay | Admitting: Internal Medicine

## 2007-04-02 IMAGING — CR DG SMALL BOWEL
11 series · 11 of 11 positions shown · non-contrast
Comparison: none

CLINICAL DATA: Left mid upper abdominal pain. Recent abdominal surgery.
SMALL BOWEL SERIES:

[view not recorded (1 of 11)]
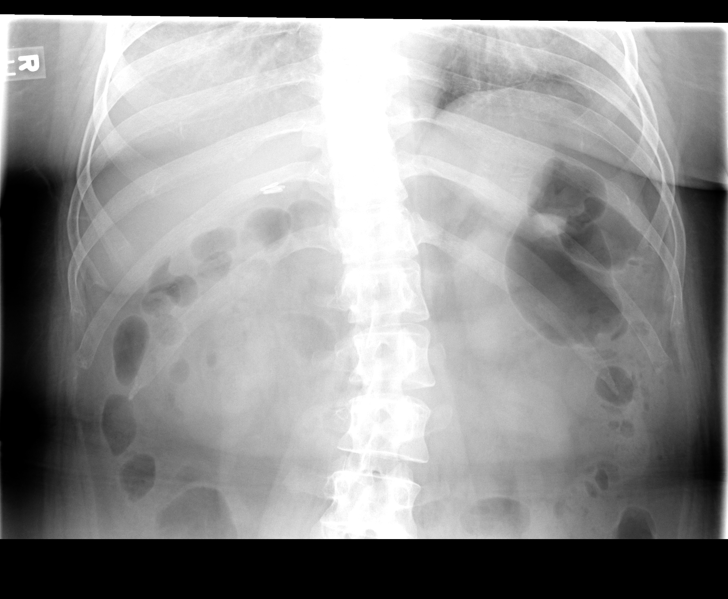

[view not recorded (2 of 11)]
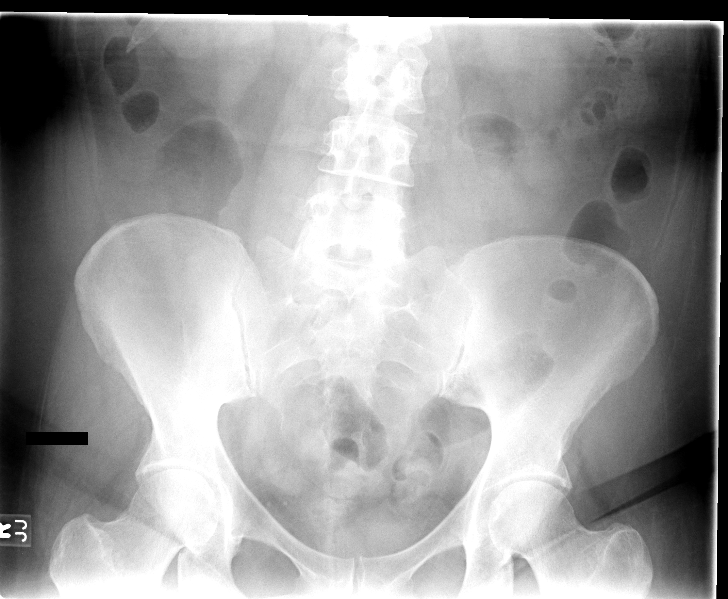

[view not recorded (3 of 11)]
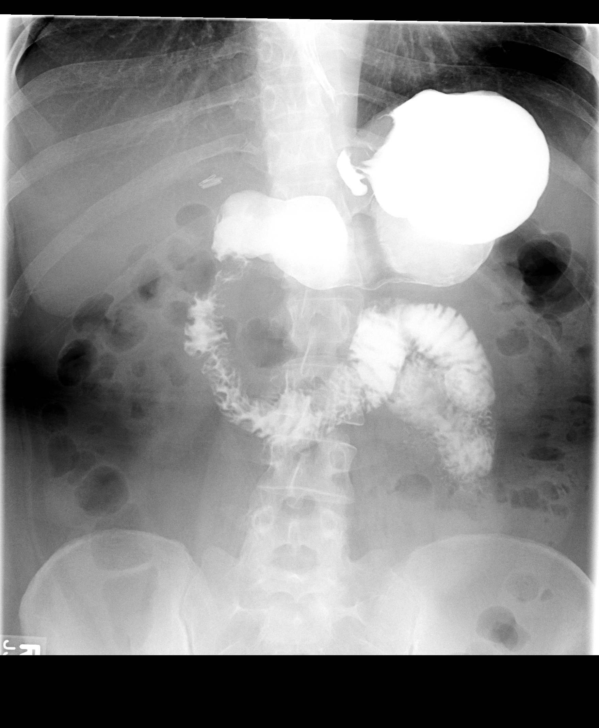

[view not recorded (4 of 11)]
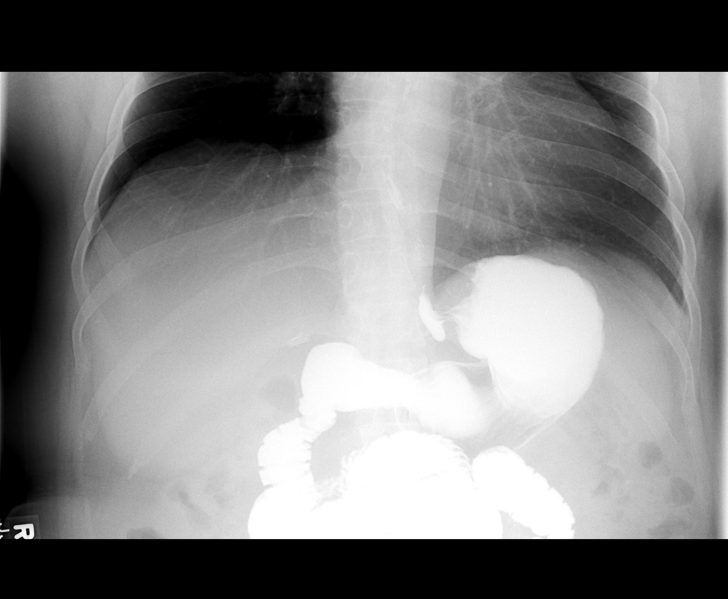

[view not recorded (5 of 11)]
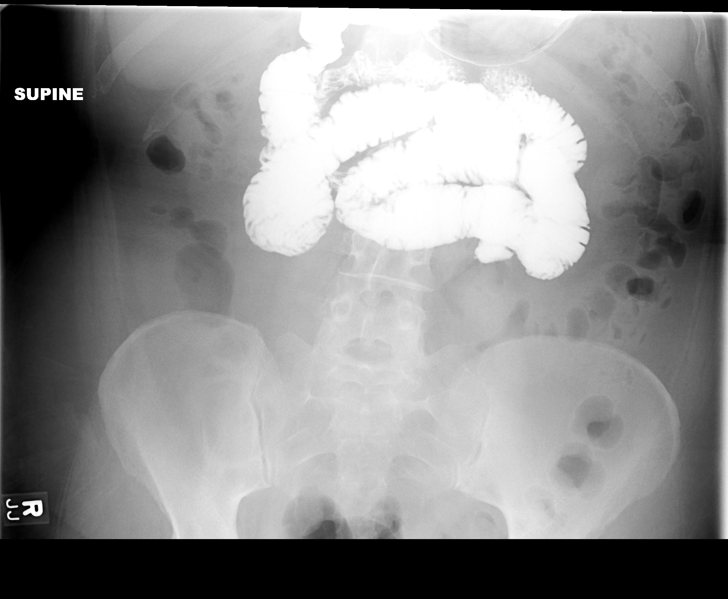

[view not recorded (6 of 11)]
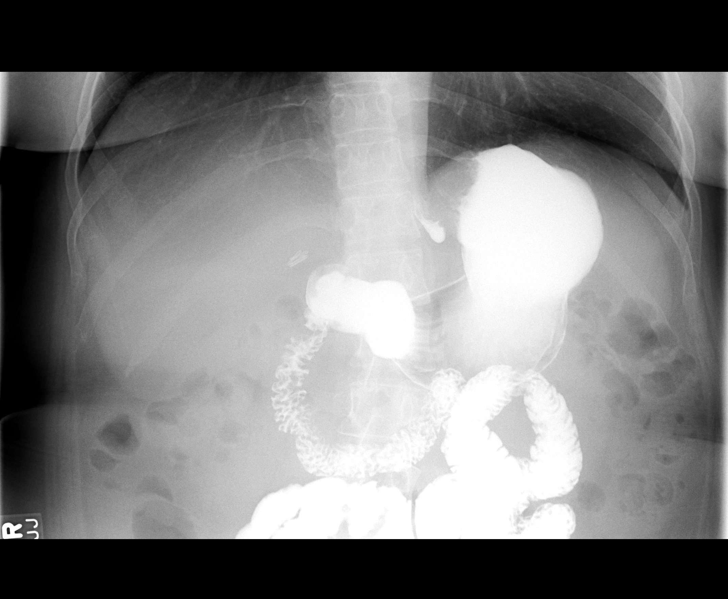

[view not recorded (7 of 11)]
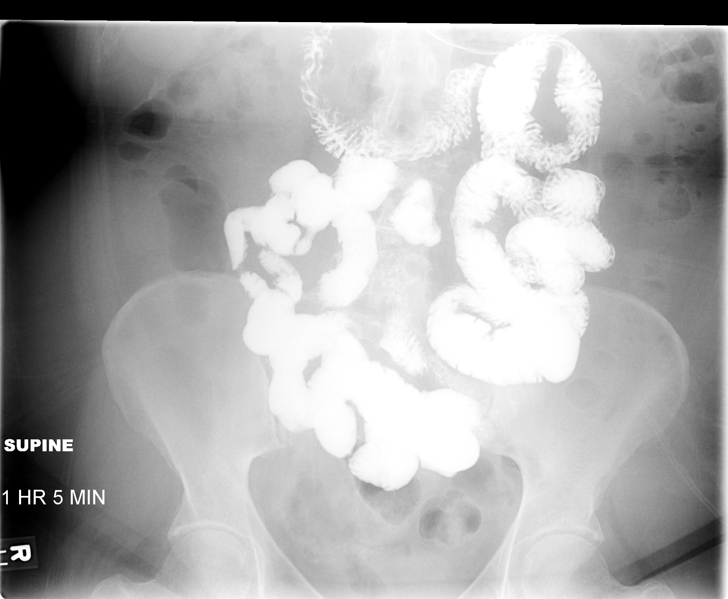

[view not recorded (8 of 11)]
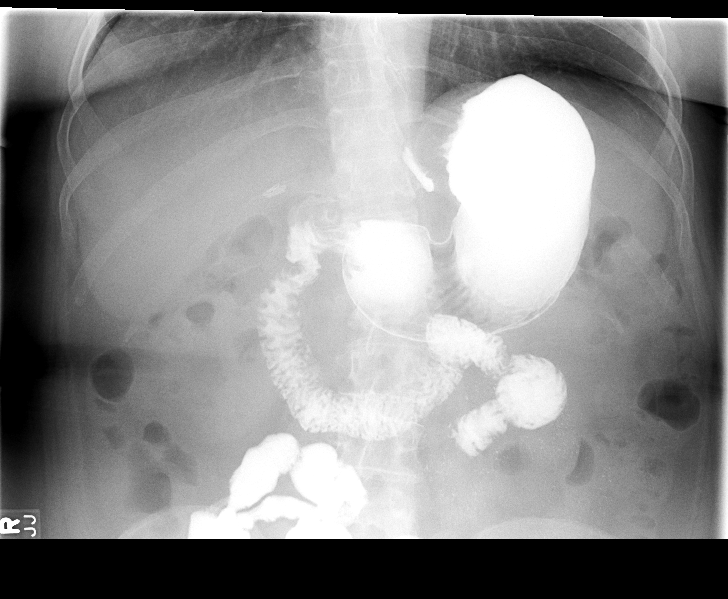

[view not recorded (9 of 11)]
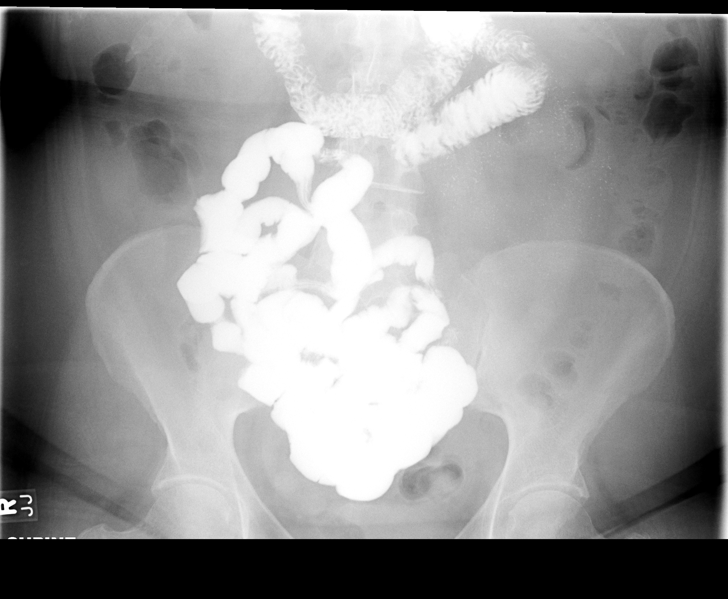

[view not recorded (10 of 11)]
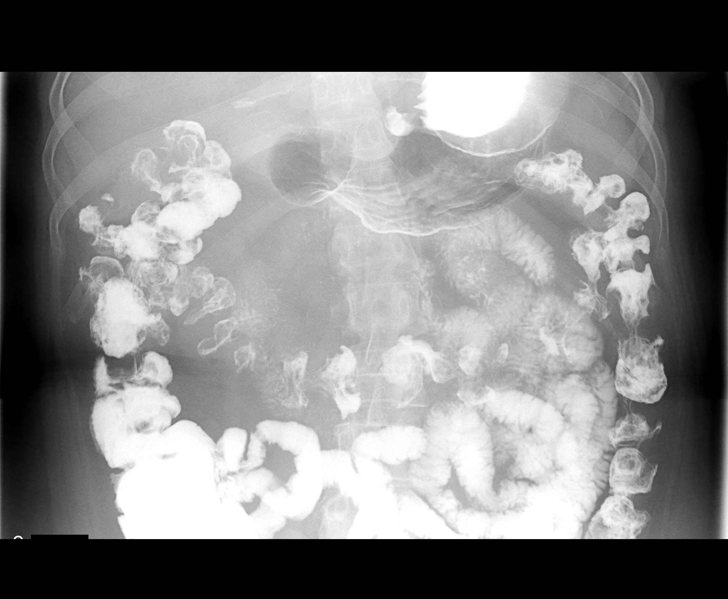

[view not recorded (11 of 11)]
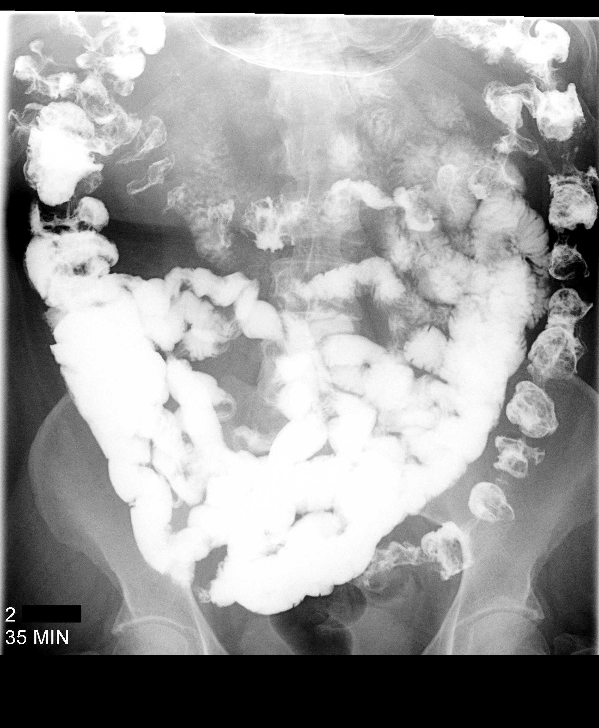

[11 of 11 positions shown; findings below may reference images not displayed]

FINDINGS: Two preliminary scout films were obtained and reveal unremarkable bowel gas pattern.
Mid to upper lumbar scoliosis convex to the left.  Metallic clips right upper quadrant consistent with cholecystectomy.  The patient ingested barium and serial overhead films were obtained.  After the barium reached the colon, I obtained fluoroscopic guided spot films with and without manual compression.  Normal small bowel caliber.  Normal caliber of the mucosal folds.  No mass or other focal abnormality detected.  Terminal ileal region appears unremarkable.
IMPRESSION: Negative barium small bowel series.

## 2007-04-03 ENCOUNTER — Telehealth (INDEPENDENT_AMBULATORY_CARE_PROVIDER_SITE_OTHER): Payer: Self-pay | Admitting: Internal Medicine

## 2007-04-03 IMAGING — CR DG CHEST 1V PORT
1 series · 1 of 1 positions shown · non-contrast
Comparison: none

CLINICAL DATA: 49-year-old with PICC line placement. 
 PORTABLE CHEST - 1 VIEW:
 Single portable view of the chest is compared to previous study from 04/11/05.  Heart is borderline enlarged but stable.  Mediastinal and hilar contours are within normal limits and unchanged.  Lungs are relatively clear.  There is a right sided PICC line with its tip in the distal superior vena cava right at the cavoatrial junction.

[view not recorded]
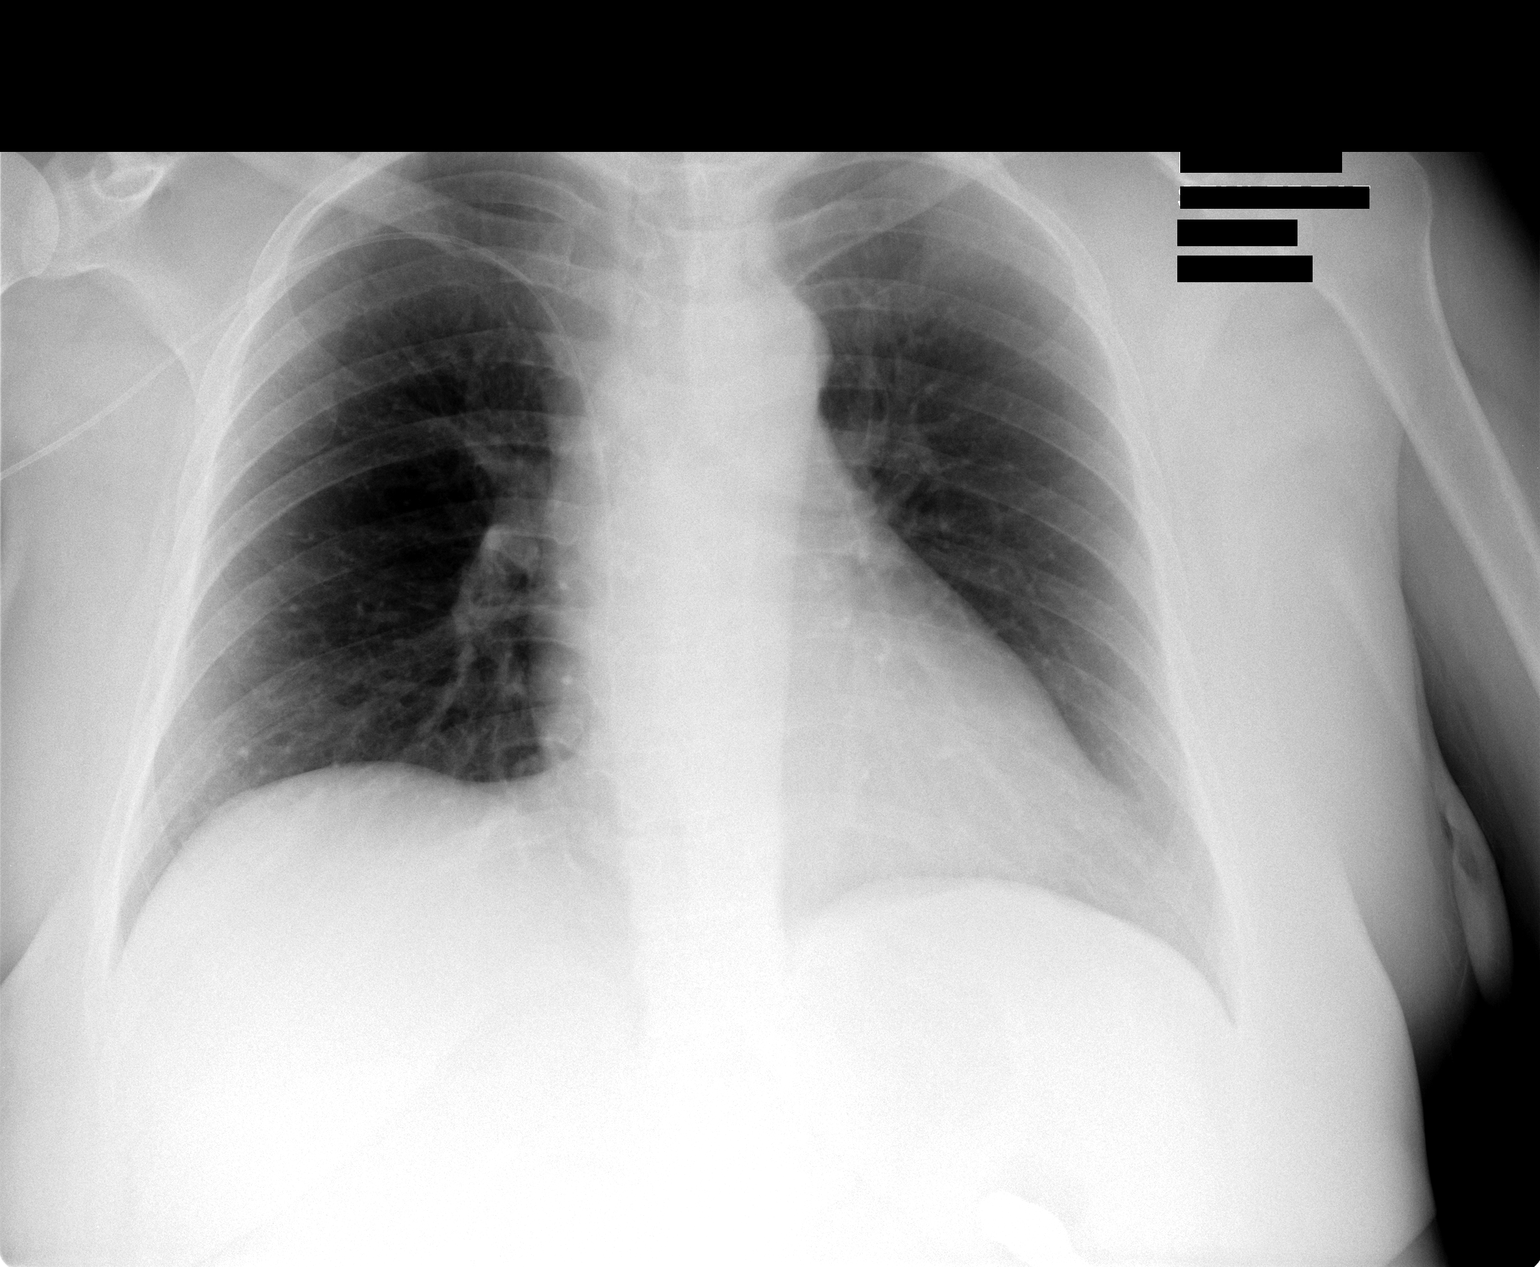

[1 of 1 positions shown; findings below may reference images not displayed]

IMPRESSION: Right sided PICC line in good position at the cavoatrial junction.

## 2007-04-10 ENCOUNTER — Ambulatory Visit: Payer: Self-pay | Admitting: Internal Medicine

## 2007-04-17 ENCOUNTER — Encounter (INDEPENDENT_AMBULATORY_CARE_PROVIDER_SITE_OTHER): Payer: Self-pay | Admitting: Internal Medicine

## 2007-04-30 ENCOUNTER — Ambulatory Visit: Payer: Self-pay | Admitting: Pain Medicine

## 2007-04-30 ENCOUNTER — Encounter (INDEPENDENT_AMBULATORY_CARE_PROVIDER_SITE_OTHER): Payer: Self-pay | Admitting: Internal Medicine

## 2007-05-05 ENCOUNTER — Ambulatory Visit: Payer: Self-pay | Admitting: Internal Medicine

## 2007-05-05 ENCOUNTER — Inpatient Hospital Stay (HOSPITAL_COMMUNITY): Admission: EM | Admit: 2007-05-05 | Discharge: 2007-05-07 | Payer: Self-pay | Admitting: Emergency Medicine

## 2007-05-07 ENCOUNTER — Encounter (INDEPENDENT_AMBULATORY_CARE_PROVIDER_SITE_OTHER): Payer: Self-pay | Admitting: Internal Medicine

## 2007-05-08 ENCOUNTER — Telehealth (INDEPENDENT_AMBULATORY_CARE_PROVIDER_SITE_OTHER): Payer: Self-pay | Admitting: Internal Medicine

## 2007-05-08 ENCOUNTER — Ambulatory Visit: Payer: Self-pay | Admitting: Family Medicine

## 2007-05-09 ENCOUNTER — Telehealth (INDEPENDENT_AMBULATORY_CARE_PROVIDER_SITE_OTHER): Payer: Self-pay | Admitting: *Deleted

## 2007-05-09 ENCOUNTER — Emergency Department (HOSPITAL_COMMUNITY): Admission: EM | Admit: 2007-05-09 | Discharge: 2007-05-09 | Payer: Self-pay | Admitting: Emergency Medicine

## 2007-05-11 ENCOUNTER — Inpatient Hospital Stay (HOSPITAL_COMMUNITY): Admission: EM | Admit: 2007-05-11 | Discharge: 2007-05-13 | Payer: Self-pay | Admitting: Emergency Medicine

## 2007-05-11 ENCOUNTER — Ambulatory Visit: Payer: Self-pay | Admitting: Internal Medicine

## 2007-05-13 ENCOUNTER — Encounter (INDEPENDENT_AMBULATORY_CARE_PROVIDER_SITE_OTHER): Payer: Self-pay | Admitting: Internal Medicine

## 2007-05-13 ENCOUNTER — Telehealth (INDEPENDENT_AMBULATORY_CARE_PROVIDER_SITE_OTHER): Payer: Self-pay | Admitting: *Deleted

## 2007-05-16 ENCOUNTER — Ambulatory Visit: Payer: Self-pay | Admitting: Internal Medicine

## 2007-05-28 ENCOUNTER — Encounter (INDEPENDENT_AMBULATORY_CARE_PROVIDER_SITE_OTHER): Payer: Self-pay | Admitting: *Deleted

## 2007-06-07 ENCOUNTER — Inpatient Hospital Stay (HOSPITAL_COMMUNITY): Admission: EM | Admit: 2007-06-07 | Discharge: 2007-06-12 | Payer: Self-pay | Admitting: Emergency Medicine

## 2007-06-13 ENCOUNTER — Emergency Department (HOSPITAL_COMMUNITY): Admission: EM | Admit: 2007-06-13 | Discharge: 2007-06-13 | Payer: Self-pay | Admitting: Emergency Medicine

## 2007-06-14 ENCOUNTER — Inpatient Hospital Stay (HOSPITAL_COMMUNITY): Admission: EM | Admit: 2007-06-14 | Discharge: 2007-06-18 | Payer: Self-pay | Admitting: Emergency Medicine

## 2007-06-23 ENCOUNTER — Ambulatory Visit: Payer: Self-pay | Admitting: Internal Medicine

## 2007-06-27 ENCOUNTER — Ambulatory Visit: Payer: Self-pay | Admitting: Nurse Practitioner

## 2007-06-27 DIAGNOSIS — M7918 Myalgia, other site: Secondary | ICD-10-CM

## 2007-06-27 DIAGNOSIS — F341 Dysthymic disorder: Secondary | ICD-10-CM | POA: Insufficient documentation

## 2007-07-20 ENCOUNTER — Inpatient Hospital Stay (HOSPITAL_COMMUNITY): Admission: EM | Admit: 2007-07-20 | Discharge: 2007-07-23 | Payer: Self-pay | Admitting: Emergency Medicine

## 2007-07-23 ENCOUNTER — Telehealth (INDEPENDENT_AMBULATORY_CARE_PROVIDER_SITE_OTHER): Payer: Self-pay | Admitting: Internal Medicine

## 2007-07-24 ENCOUNTER — Inpatient Hospital Stay (HOSPITAL_COMMUNITY): Admission: EM | Admit: 2007-07-24 | Discharge: 2007-07-25 | Payer: Self-pay | Admitting: Emergency Medicine

## 2007-07-25 ENCOUNTER — Encounter (INDEPENDENT_AMBULATORY_CARE_PROVIDER_SITE_OTHER): Payer: Self-pay | Admitting: Internal Medicine

## 2007-07-30 ENCOUNTER — Telehealth (INDEPENDENT_AMBULATORY_CARE_PROVIDER_SITE_OTHER): Payer: Self-pay | Admitting: *Deleted

## 2007-07-30 IMAGING — CR DG ABDOMEN ACUTE W/ 1V CHEST
4 series · 4 of 4 positions shown · non-contrast
Comparison: none

CLINICAL DATA: Abdominal distention, cough

[w chest pa]
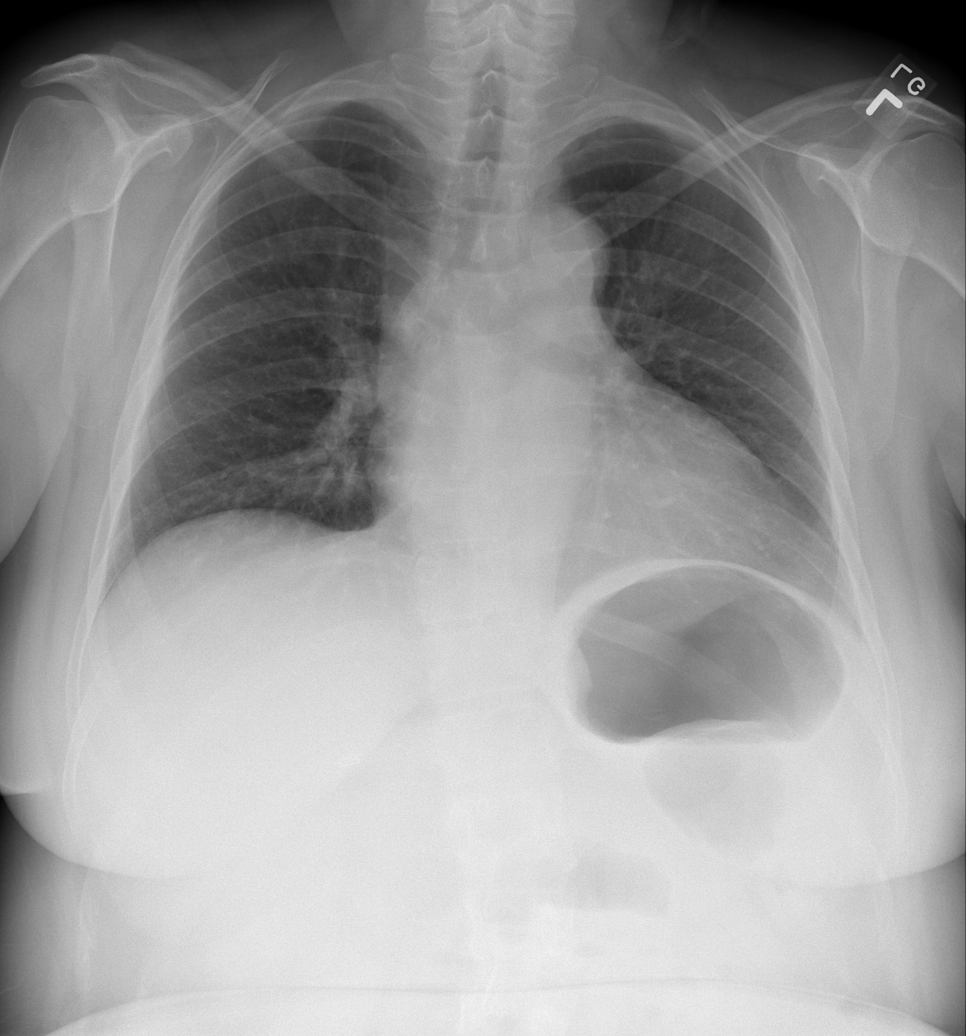

[w abdomen decub *]
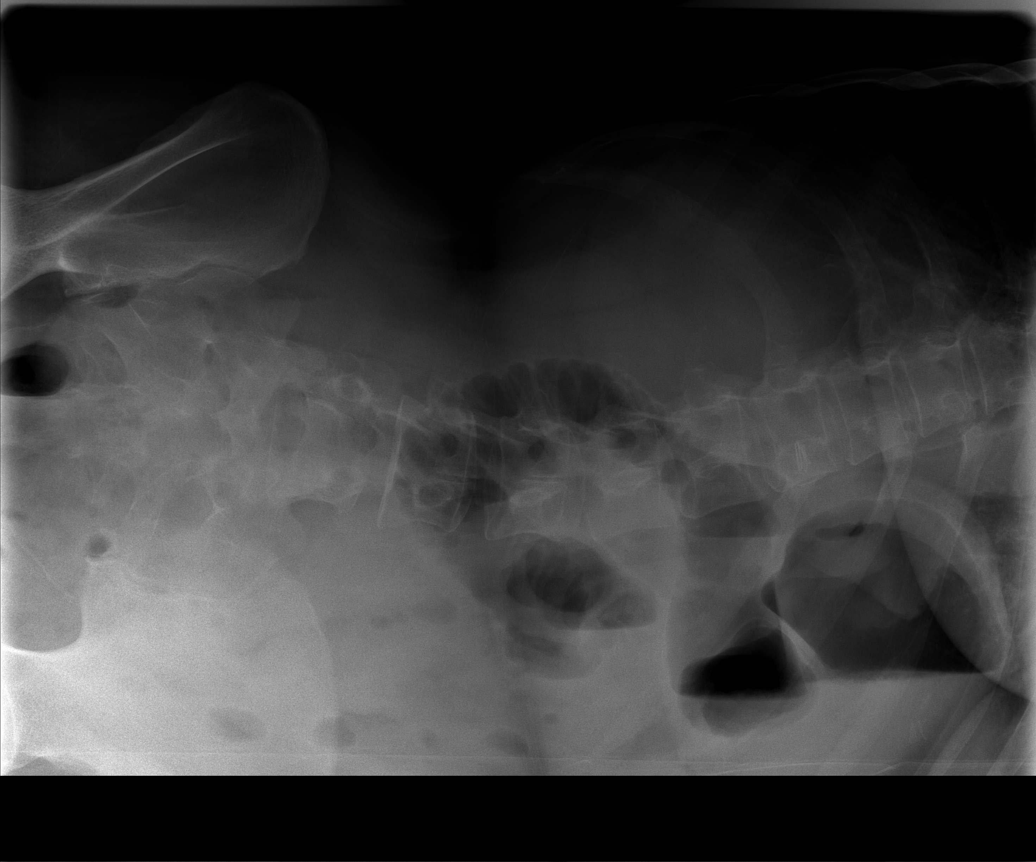

[t abdomen supine (1 of 2)]
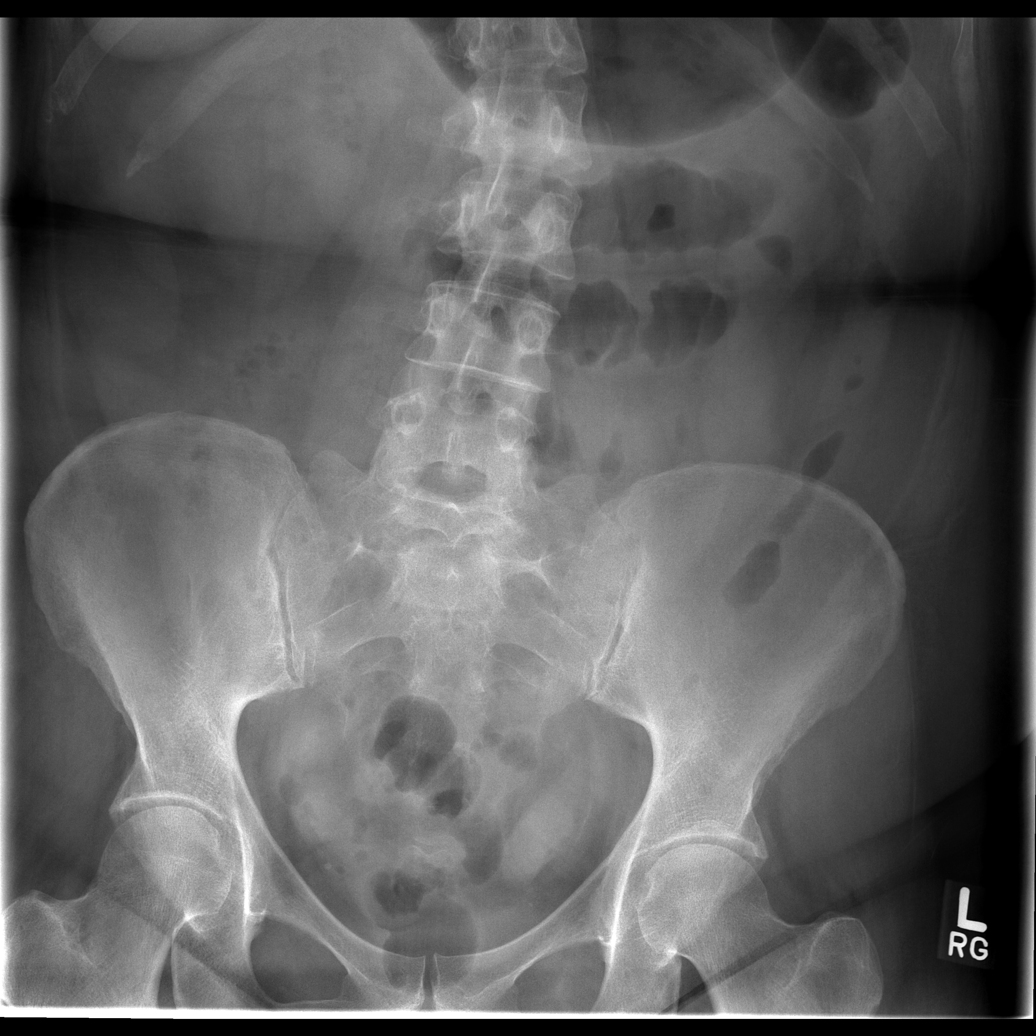

[t abdomen supine (2 of 2)]
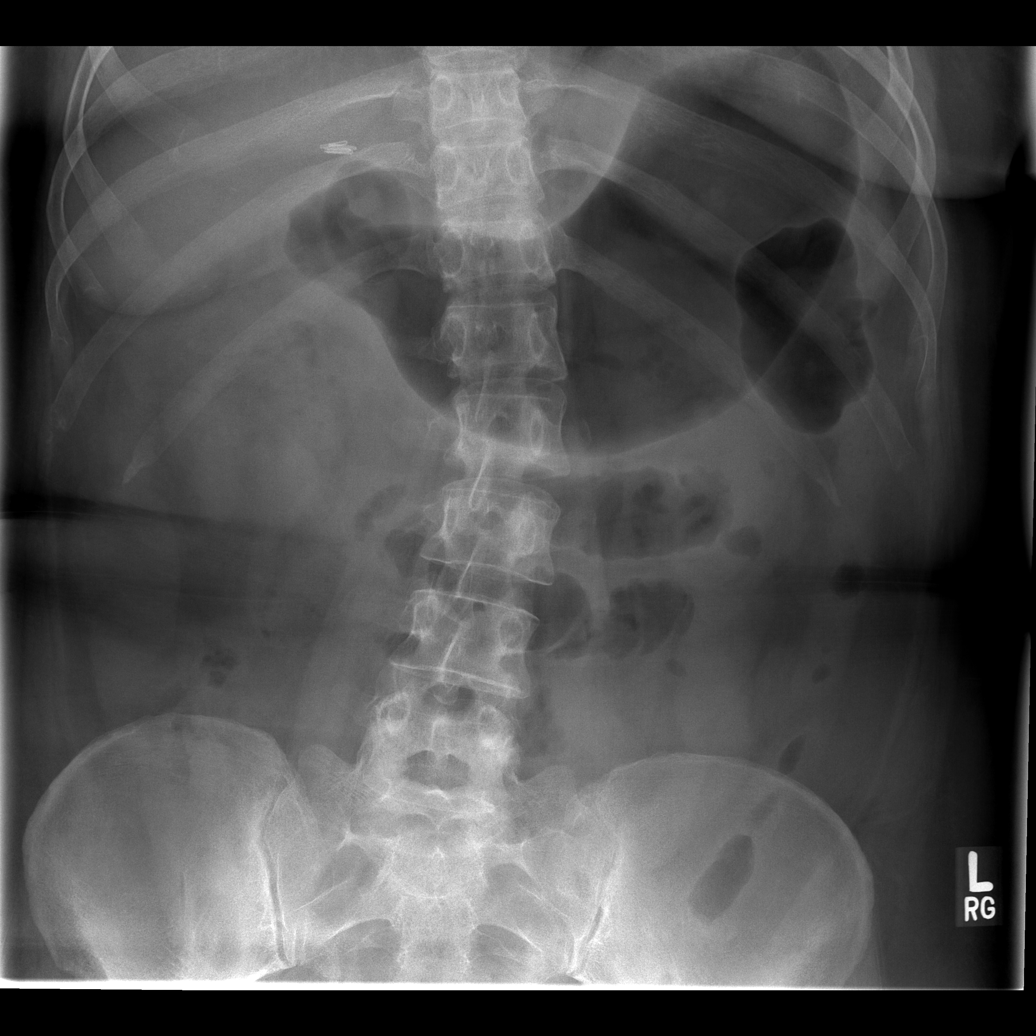

[4 of 4 positions shown; findings below may reference images not displayed]

Acute abdomen with chest:

Frontal chest film shows mild enlargement of cardiac silhouette. Lungs clear. No
effusion. Supine and left lateral decubitus abdominal films show single loop of
gas-distended small bowel in the midabdomen with fluid levels on the decubitus
film. No free air. The remainder of small bowel and colon appear decompressed.
No abnormal abdominal calcifications. Vascular clips in the right upper abdomen.
IMPRESSION: 1. Single distended midabdominal small bowel loop, nonspecific. Considerations
include sentinel loop from adjacent inflammatory process, proximal early or
partial small bowel obstruction. If symptoms persist, followup films may be
useful for further evaluation.

## 2007-07-31 IMAGING — CR DG ABDOMEN 2V
3 series · 3 of 3 positions shown · non-contrast
Comparison: 09/05/05.
 ABDOMEN - 2 VIEW:

CLINICAL DATA: Nausea, vomiting and diarrhea.

[w abdomen upright]
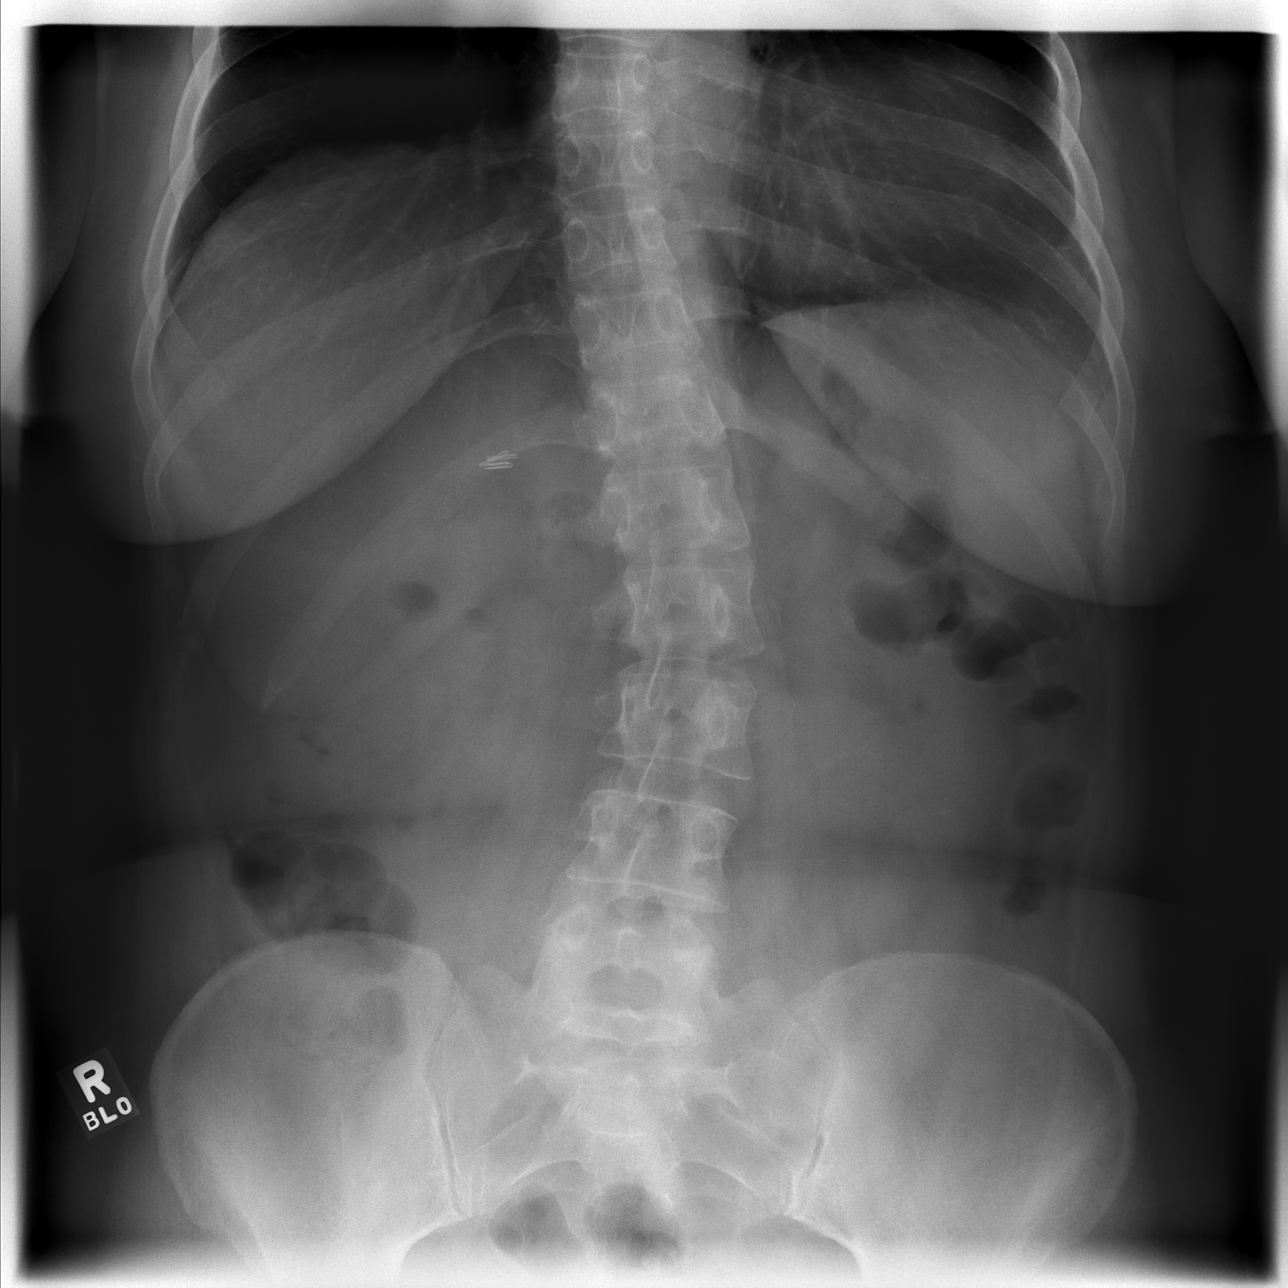

[t abdomen supine (1 of 2)]
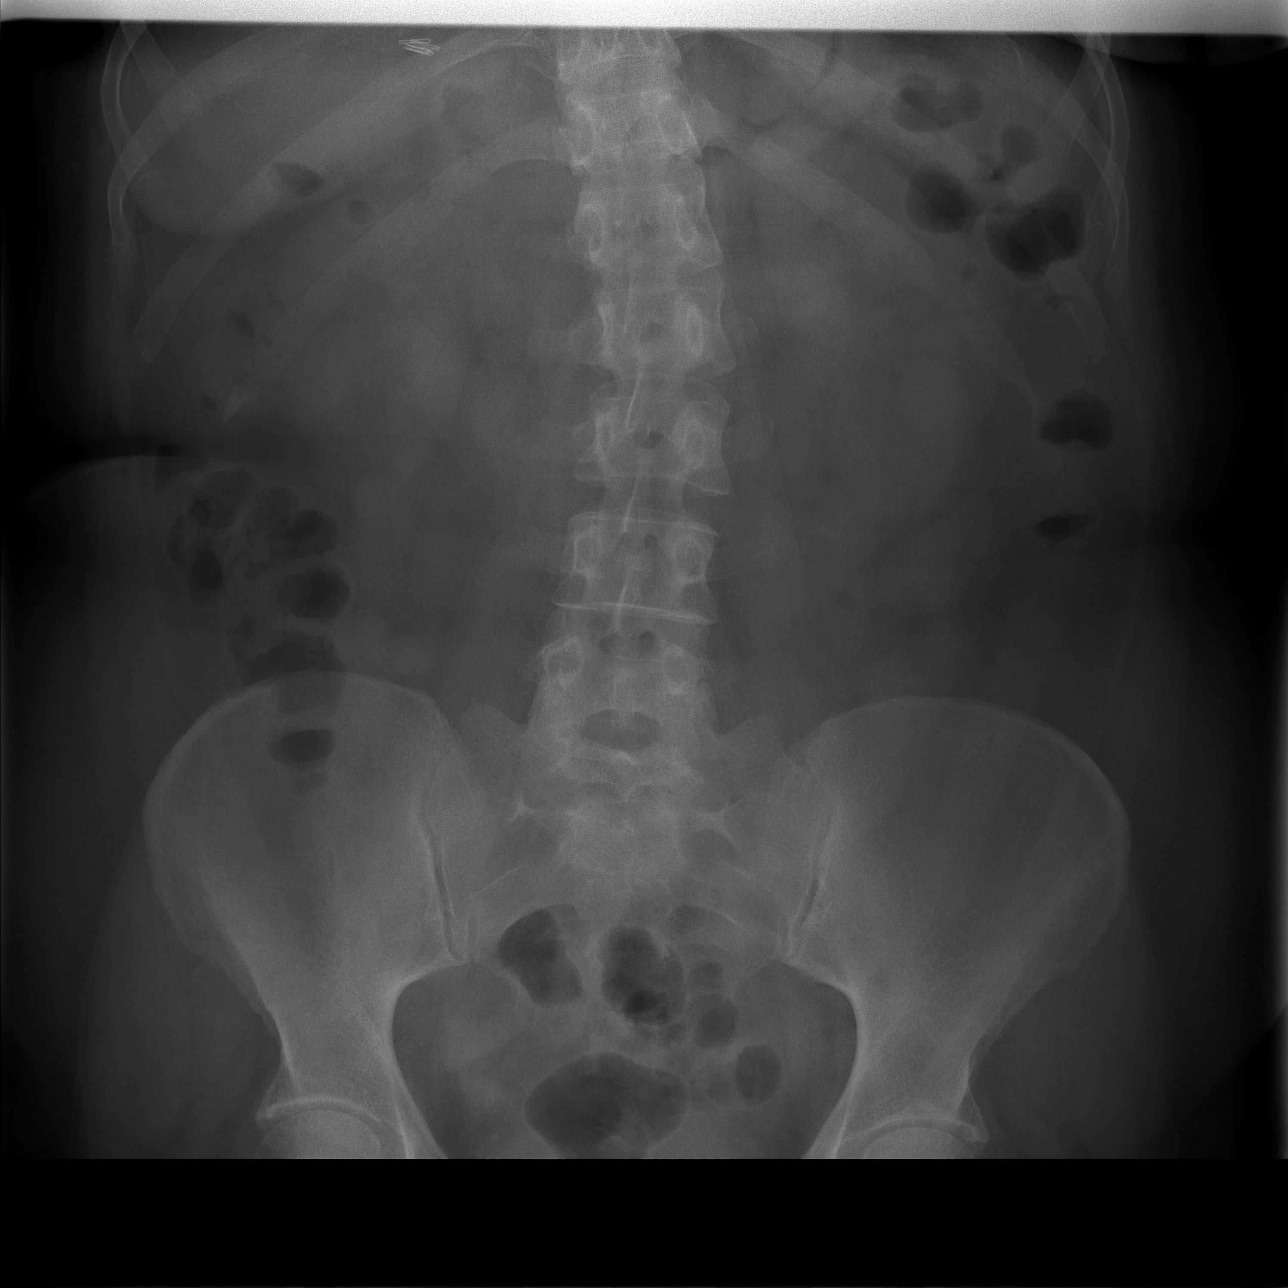

[t abdomen supine (2 of 2)]
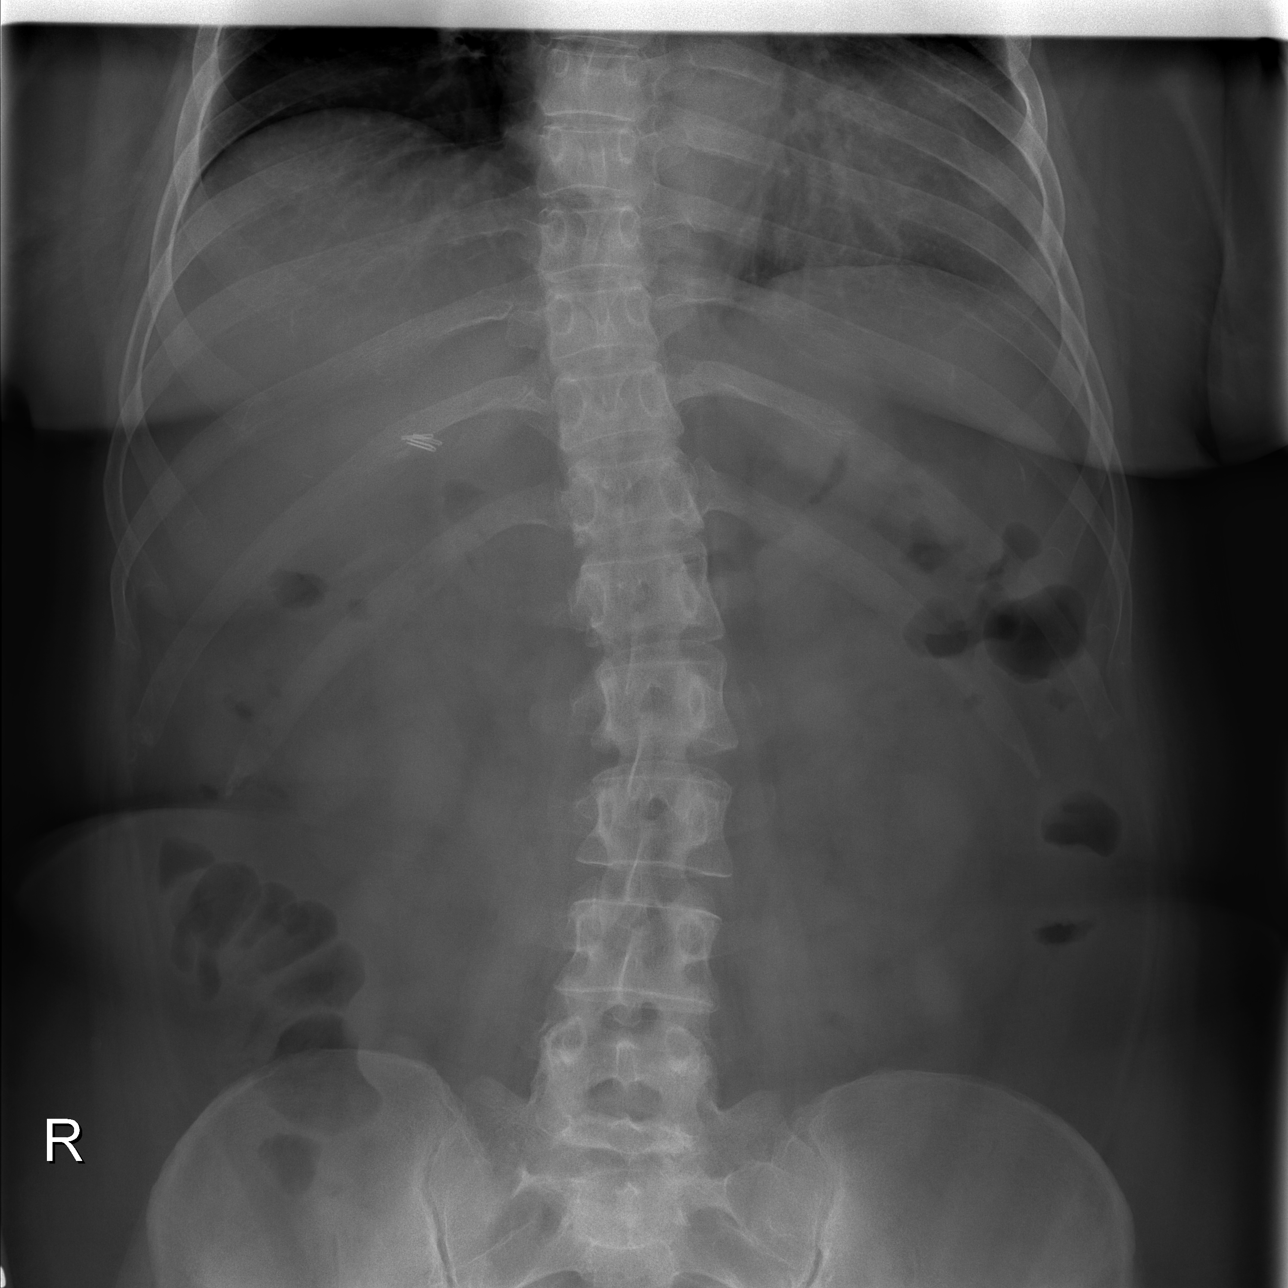

[3 of 3 positions shown; findings below may reference images not displayed]

FINDINGS: Cholecystectomy clips are seen within the right upper quadrant.  
 Gas is seen within the colon.  
 No abnormally dilated loops of small bowel are noted.  There is no free intraperitoneal air.  Scoliosis deformity affects the thoracolumbar spine.
IMPRESSION: Non-obstructive bowel gas pattern.

## 2007-09-01 ENCOUNTER — Encounter (INDEPENDENT_AMBULATORY_CARE_PROVIDER_SITE_OTHER): Payer: Self-pay | Admitting: Internal Medicine

## 2007-09-06 ENCOUNTER — Inpatient Hospital Stay (HOSPITAL_COMMUNITY): Admission: EM | Admit: 2007-09-06 | Discharge: 2007-09-09 | Payer: Self-pay | Admitting: Emergency Medicine

## 2007-10-09 ENCOUNTER — Emergency Department (HOSPITAL_COMMUNITY): Admission: EM | Admit: 2007-10-09 | Discharge: 2007-10-09 | Payer: Self-pay | Admitting: Emergency Medicine

## 2007-10-14 ENCOUNTER — Encounter (INDEPENDENT_AMBULATORY_CARE_PROVIDER_SITE_OTHER): Payer: Self-pay | Admitting: Internal Medicine

## 2007-10-23 ENCOUNTER — Emergency Department (HOSPITAL_COMMUNITY): Admission: EM | Admit: 2007-10-23 | Discharge: 2007-10-23 | Payer: Self-pay | Admitting: Emergency Medicine

## 2007-10-25 ENCOUNTER — Emergency Department (HOSPITAL_COMMUNITY): Admission: EM | Admit: 2007-10-25 | Discharge: 2007-10-25 | Payer: Self-pay | Admitting: Emergency Medicine

## 2007-10-26 ENCOUNTER — Emergency Department (HOSPITAL_COMMUNITY): Admission: EM | Admit: 2007-10-26 | Discharge: 2007-10-27 | Payer: Self-pay | Admitting: Emergency Medicine

## 2007-10-30 ENCOUNTER — Emergency Department (HOSPITAL_COMMUNITY): Admission: EM | Admit: 2007-10-30 | Discharge: 2007-10-30 | Payer: Self-pay | Admitting: Emergency Medicine

## 2007-11-02 ENCOUNTER — Emergency Department (HOSPITAL_COMMUNITY): Admission: EM | Admit: 2007-11-02 | Discharge: 2007-11-02 | Payer: Self-pay | Admitting: Emergency Medicine

## 2007-11-03 ENCOUNTER — Emergency Department: Payer: Self-pay | Admitting: Emergency Medicine

## 2007-11-15 ENCOUNTER — Other Ambulatory Visit: Payer: Self-pay

## 2007-11-15 ENCOUNTER — Emergency Department: Payer: Self-pay | Admitting: Emergency Medicine

## 2007-11-19 ENCOUNTER — Inpatient Hospital Stay: Payer: Self-pay | Admitting: Internal Medicine

## 2007-11-19 ENCOUNTER — Other Ambulatory Visit: Payer: Self-pay

## 2007-12-22 ENCOUNTER — Ambulatory Visit: Payer: Self-pay | Admitting: Pain Medicine

## 2007-12-27 IMAGING — CR DG ABDOMEN ACUTE W/ 1V CHEST
3 series · 3 of 3 positions shown · non-contrast
Comparison: 09/06/05.

CLINICAL DATA: 50-year-old with chest pain, abdominal pain, nausea and vomiting since 01/30/06.  Diarrhea.  
 ACUTE ABDOMINAL SERIES:

[view not recorded (1 of 3)]
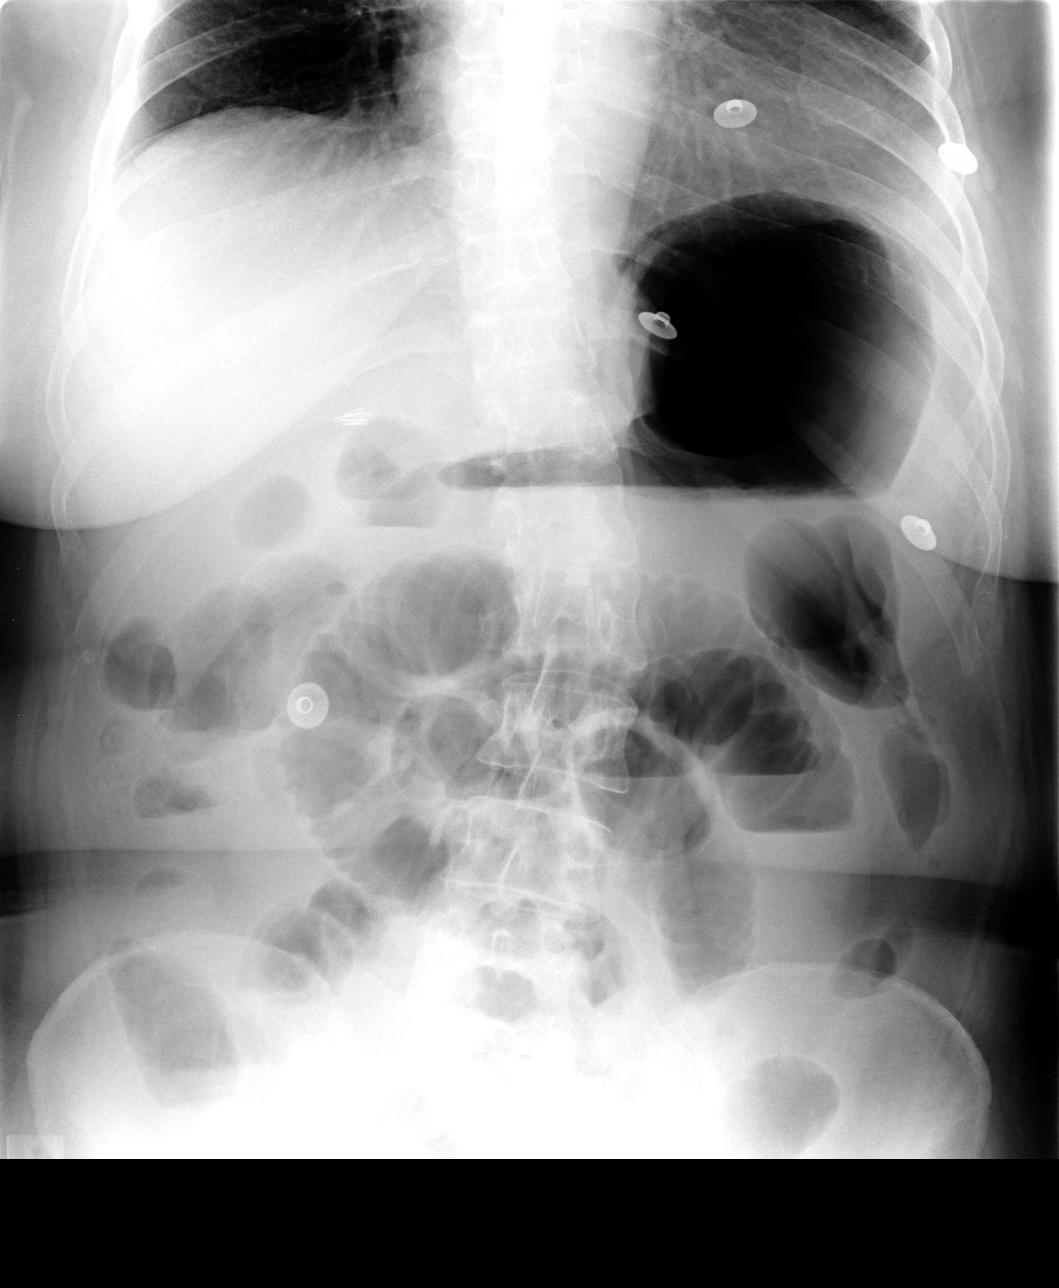

[view not recorded (2 of 3)]
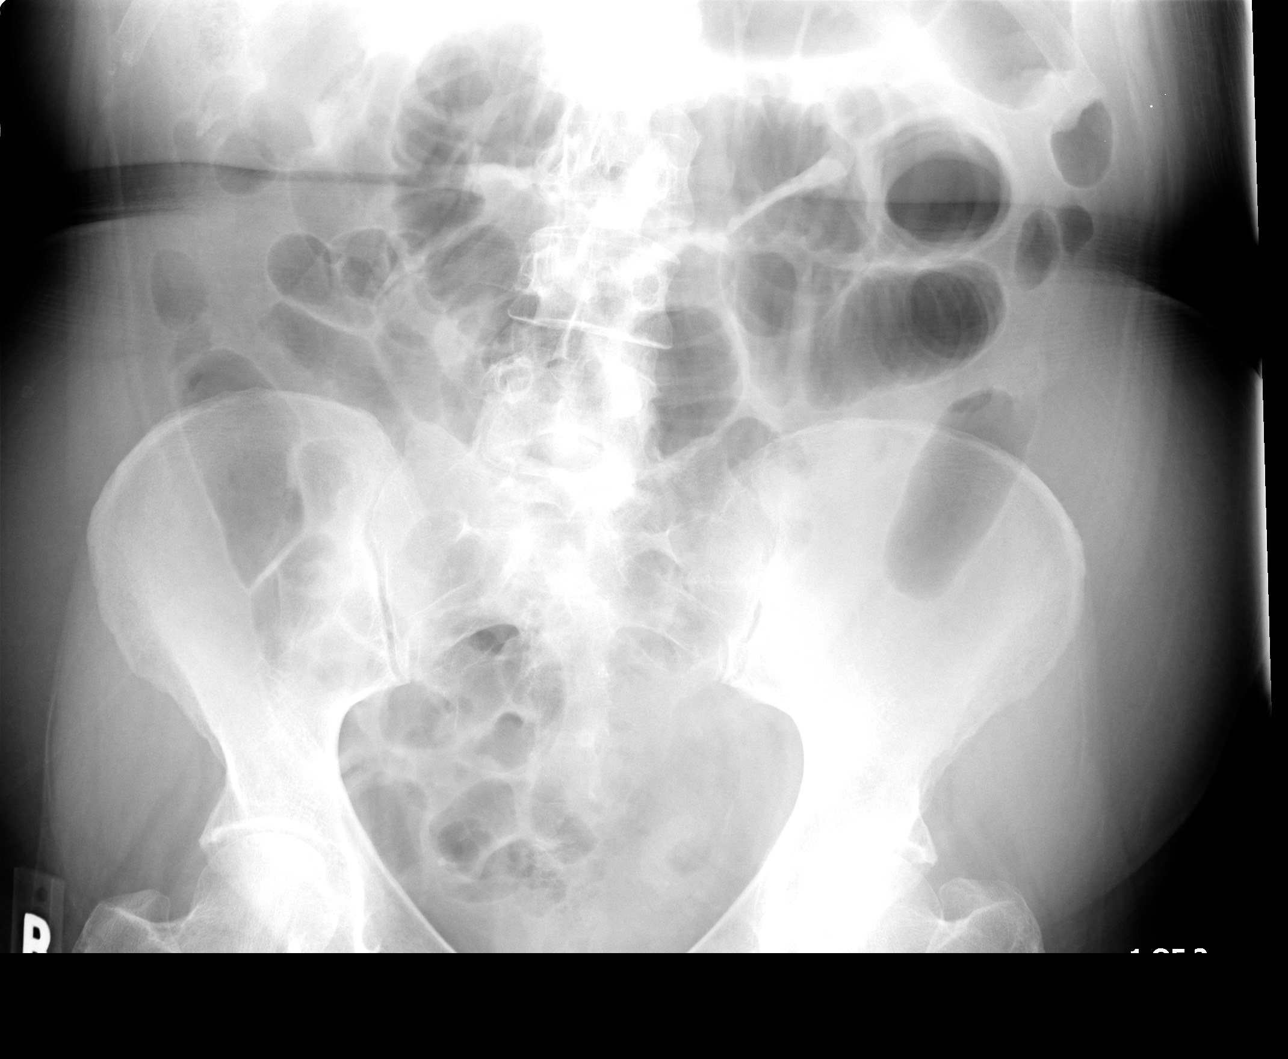

[view not recorded (3 of 3)]
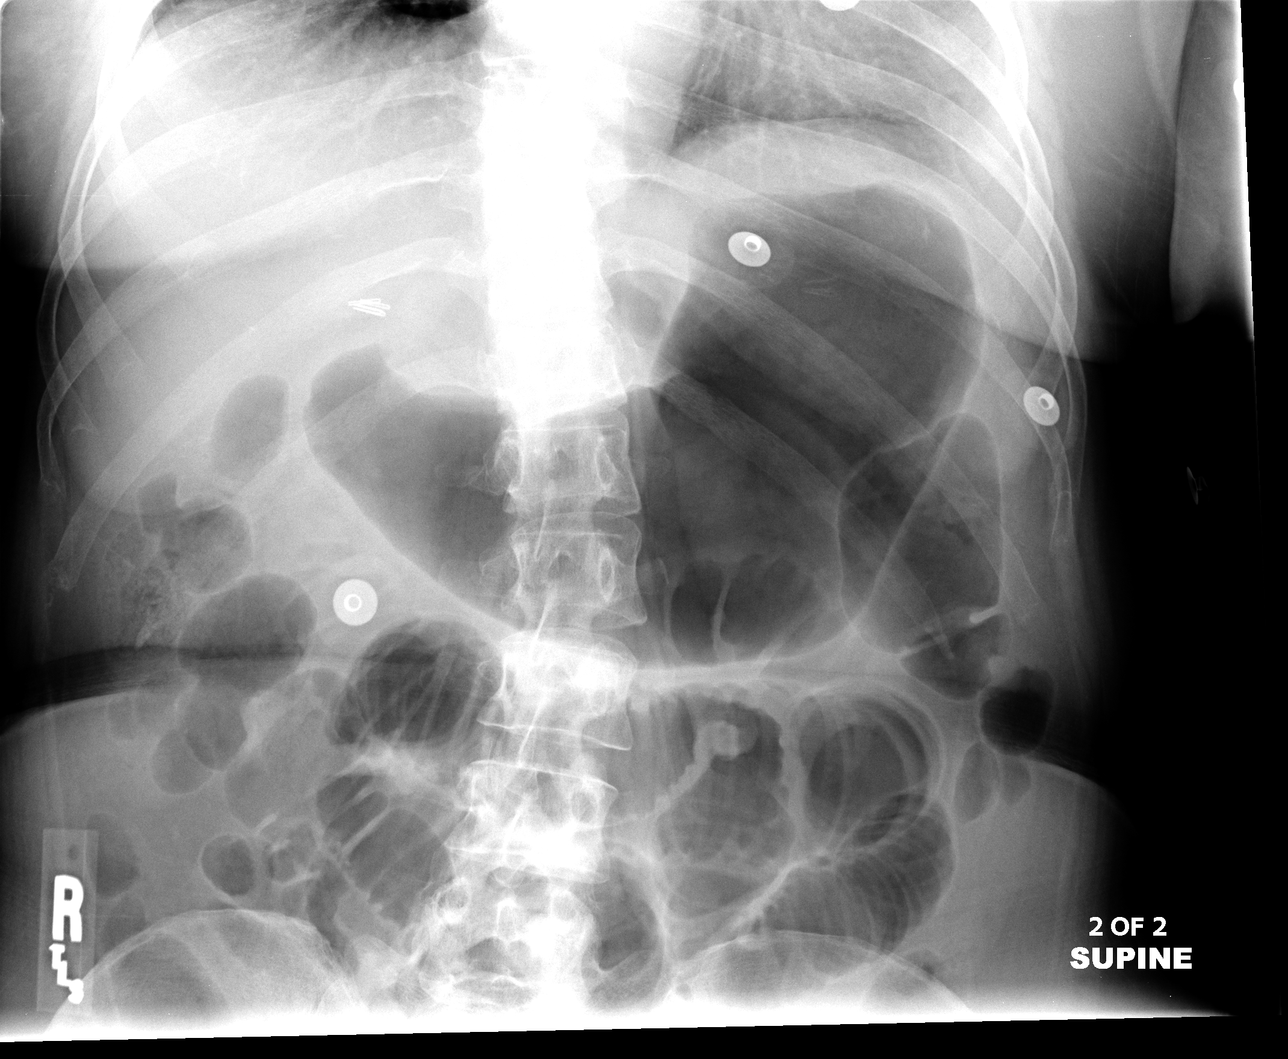

[3 of 3 positions shown; findings below may reference images not displayed]

FINDINGS: There is distention of the stomach and small bowel loops.  On the erect view there are air fluid levels within these dilated small bowel loops.  Gas is seen within nondilated loops of colon and the findings are consistent with early or partial small bowel obstruction.  No evidence for free intraperitoneal air.  S-shaped scoliosis of the thoracolumbar spine again identified.  Patient has clips in the right upper quadrant.
IMPRESSION: 1.  Partial or early small bowel obstruction. 
 2.  No free air.

## 2007-12-27 IMAGING — CT CT ABDOMEN W/ CM
2 of 5 series · 17 of 46 positions shown, 19 images · non-contrast
Comparison: none

HISTORY: Chest pain, epigastric pain, vomiting, history diabetes

[Series 2: abd/pelv with 5.0 b31f st · axial · 0.83mm/px · z∈[-448,-18]mm · 14 of 96 slices shown, 16 images]
[im 5/96  soft-tissue]
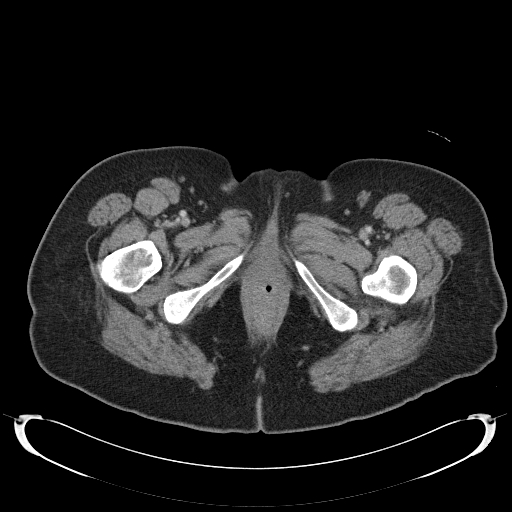
[im 5/96  bone]
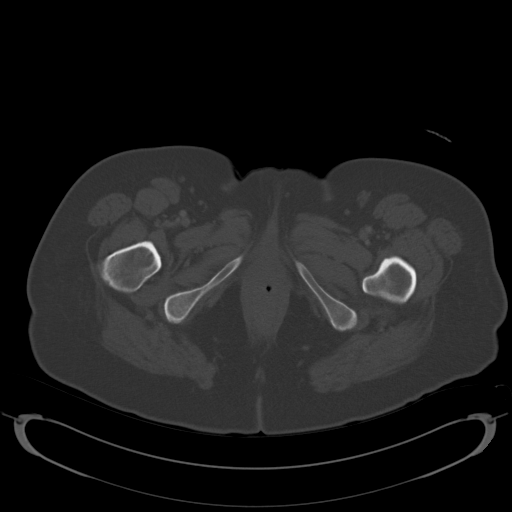
[im 15/96  soft-tissue]
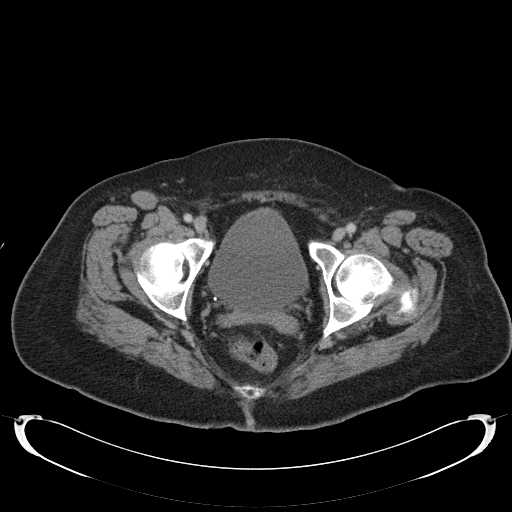
[im 20/96  soft-tissue]
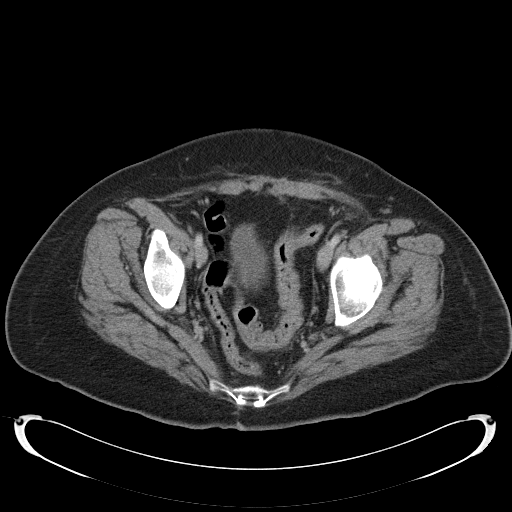
[im 24/96  soft-tissue]
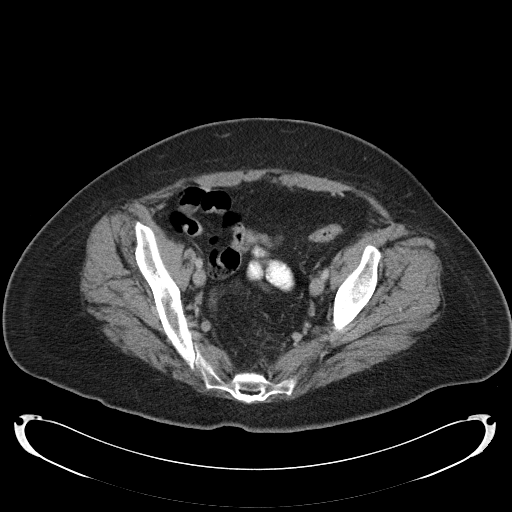
[im 34/96  soft-tissue]
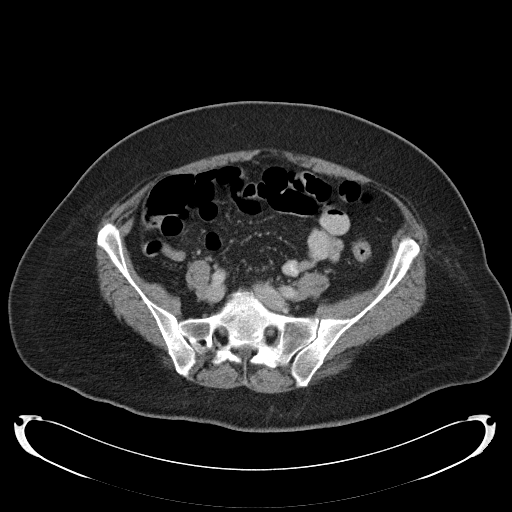
[im 39/96  soft-tissue]
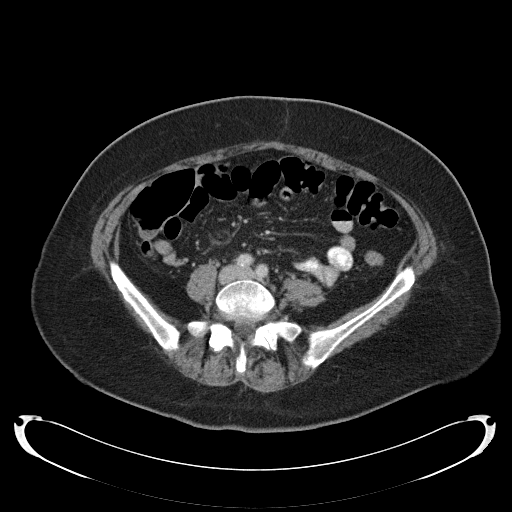
[im 43/96  soft-tissue]
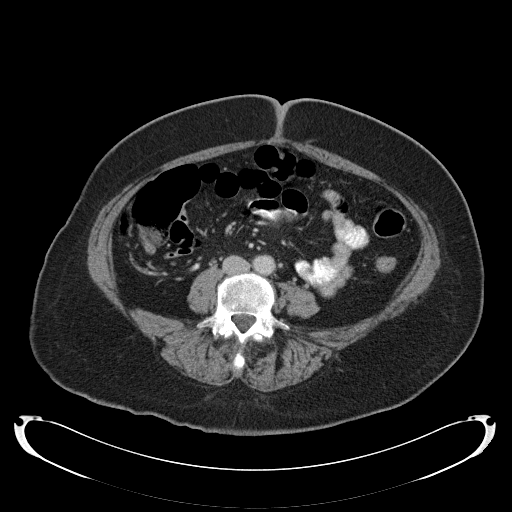
[im 53/96  soft-tissue]
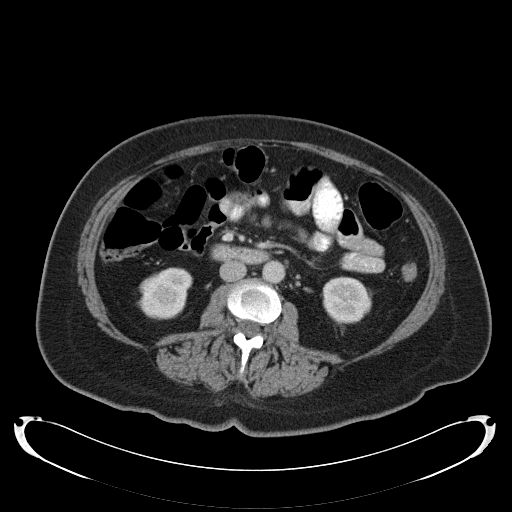
[im 58/96  soft-tissue]
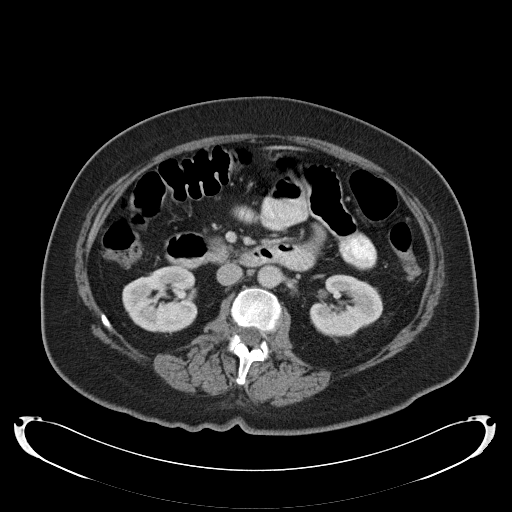
[im 58/96  bone]
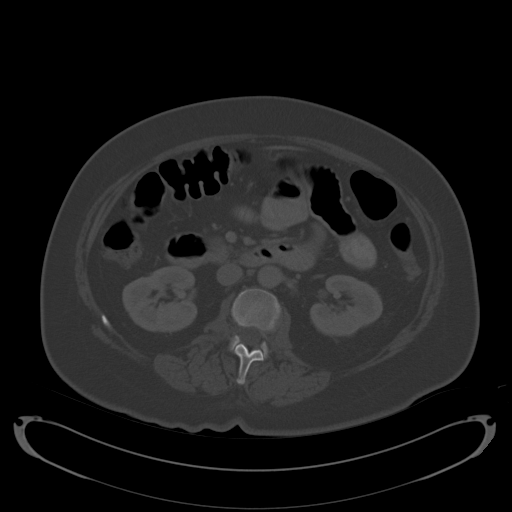
[im 62/96  soft-tissue]
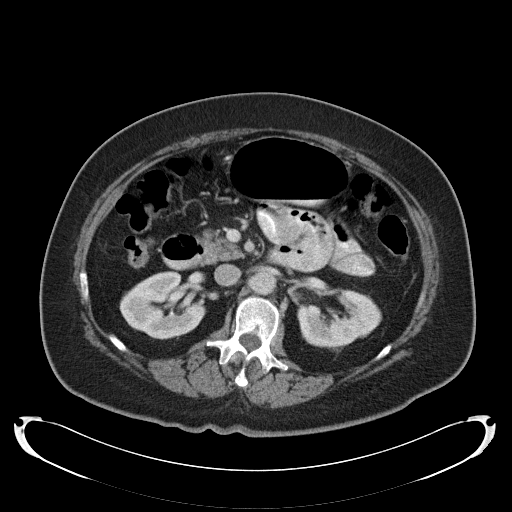
[im 72/96  soft-tissue]
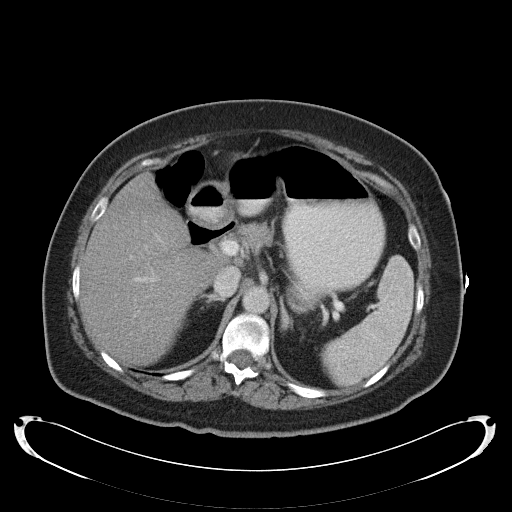
[im 77/96  soft-tissue]
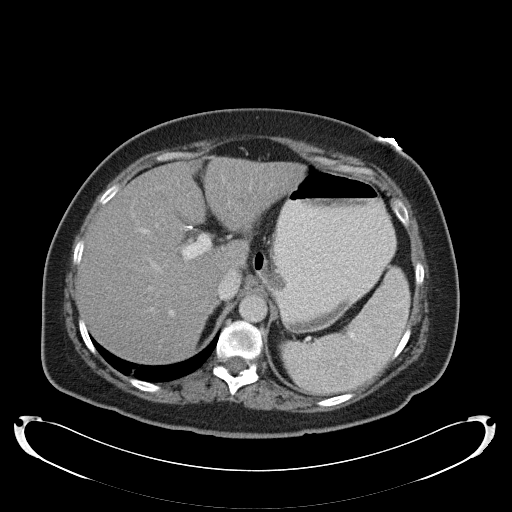
[im 81/96  soft-tissue]
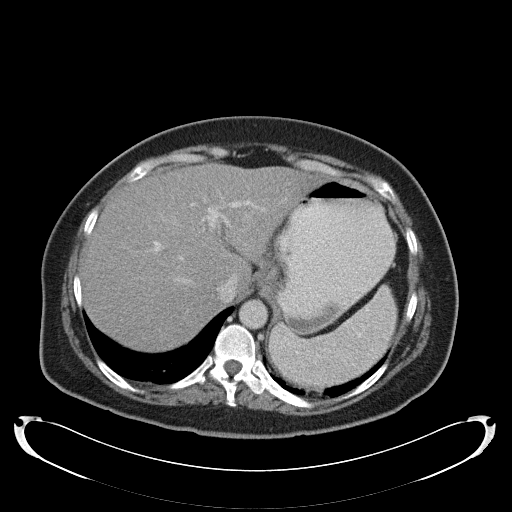
[im 91/96  soft-tissue]
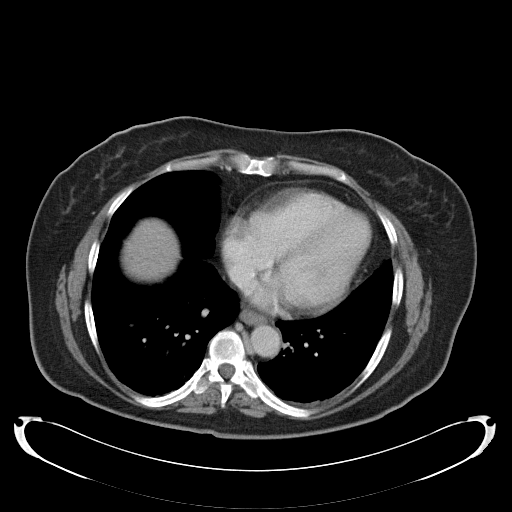

[Series 602: coronal · coronal · 0.93mm/px · 3 of 136 slices shown]
[im 46/136  soft-tissue]
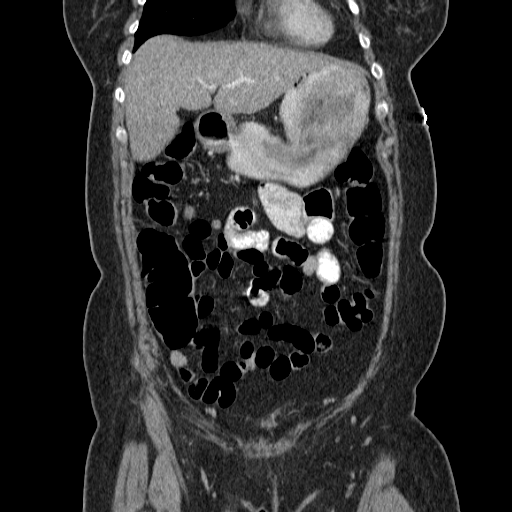
[im 61/136  soft-tissue]
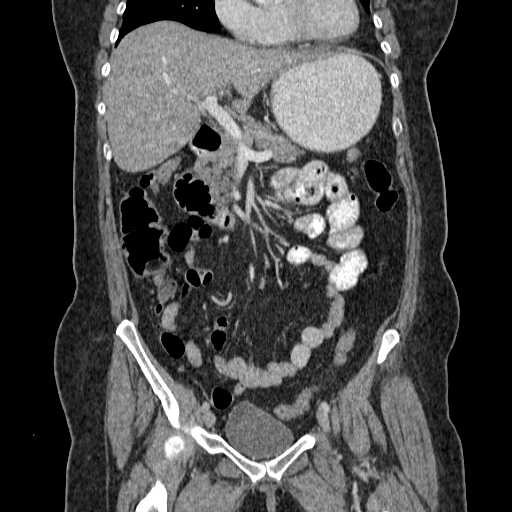
[im 76/136  soft-tissue]
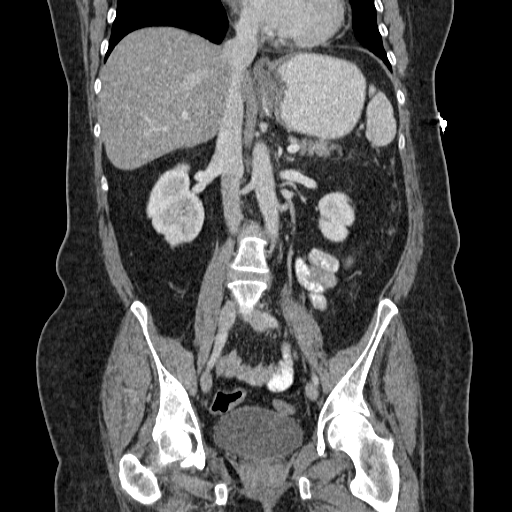

[17 of 46 positions shown; findings below may reference images not displayed]

CT ABDOMEN AND PELVIS WITH CONTRAST:

Multidetector helical CT imaging abdomen and pelvis performed.
Exam utilized dilute oral contrast and 100 cc 7mnipaque-ECC.
Comparison 04/11/2005

CT ABDOMEN:

Minimal atelectasis dependently right lower lobe.
Status post cholecystectomy.
Mild fatty infiltration of liver diffusely.
No focal abnormalities of liver, spleen, pancreas, kidneys, or adrenal glands.
Stomach and upper abdominal bowel loops unremarkable
No mass, adenopathy, free fluid, or inflammatory process.
IMPRESSION: No acute abnormalities.
Fatty infiltration of liver.

CT PELVIS:

Descending and transverse colon unopacified by contrast with sub optimal
assessment of wall thickness.
No definite pelvic mass, adenopathy, free fluid, or inflammatory process.
Bladder unremarkable.
Small bilateral pelvic phleboliths.
Normal appendix. 
Status post hysterectomy with nonvisualization of ovaries.
Bones unremarkable.
IMPRESSION: No acute abnormalities.

## 2007-12-27 IMAGING — CR DG CHEST 1V PORT
1 series · 1 of 1 positions shown · non-contrast
Comparison: 05/10/05.

CLINICAL DATA: 50-year-old with chest pain, shortness of breath.  
 PORTABLE CHEST ? 1 VIEW:

[view not recorded]
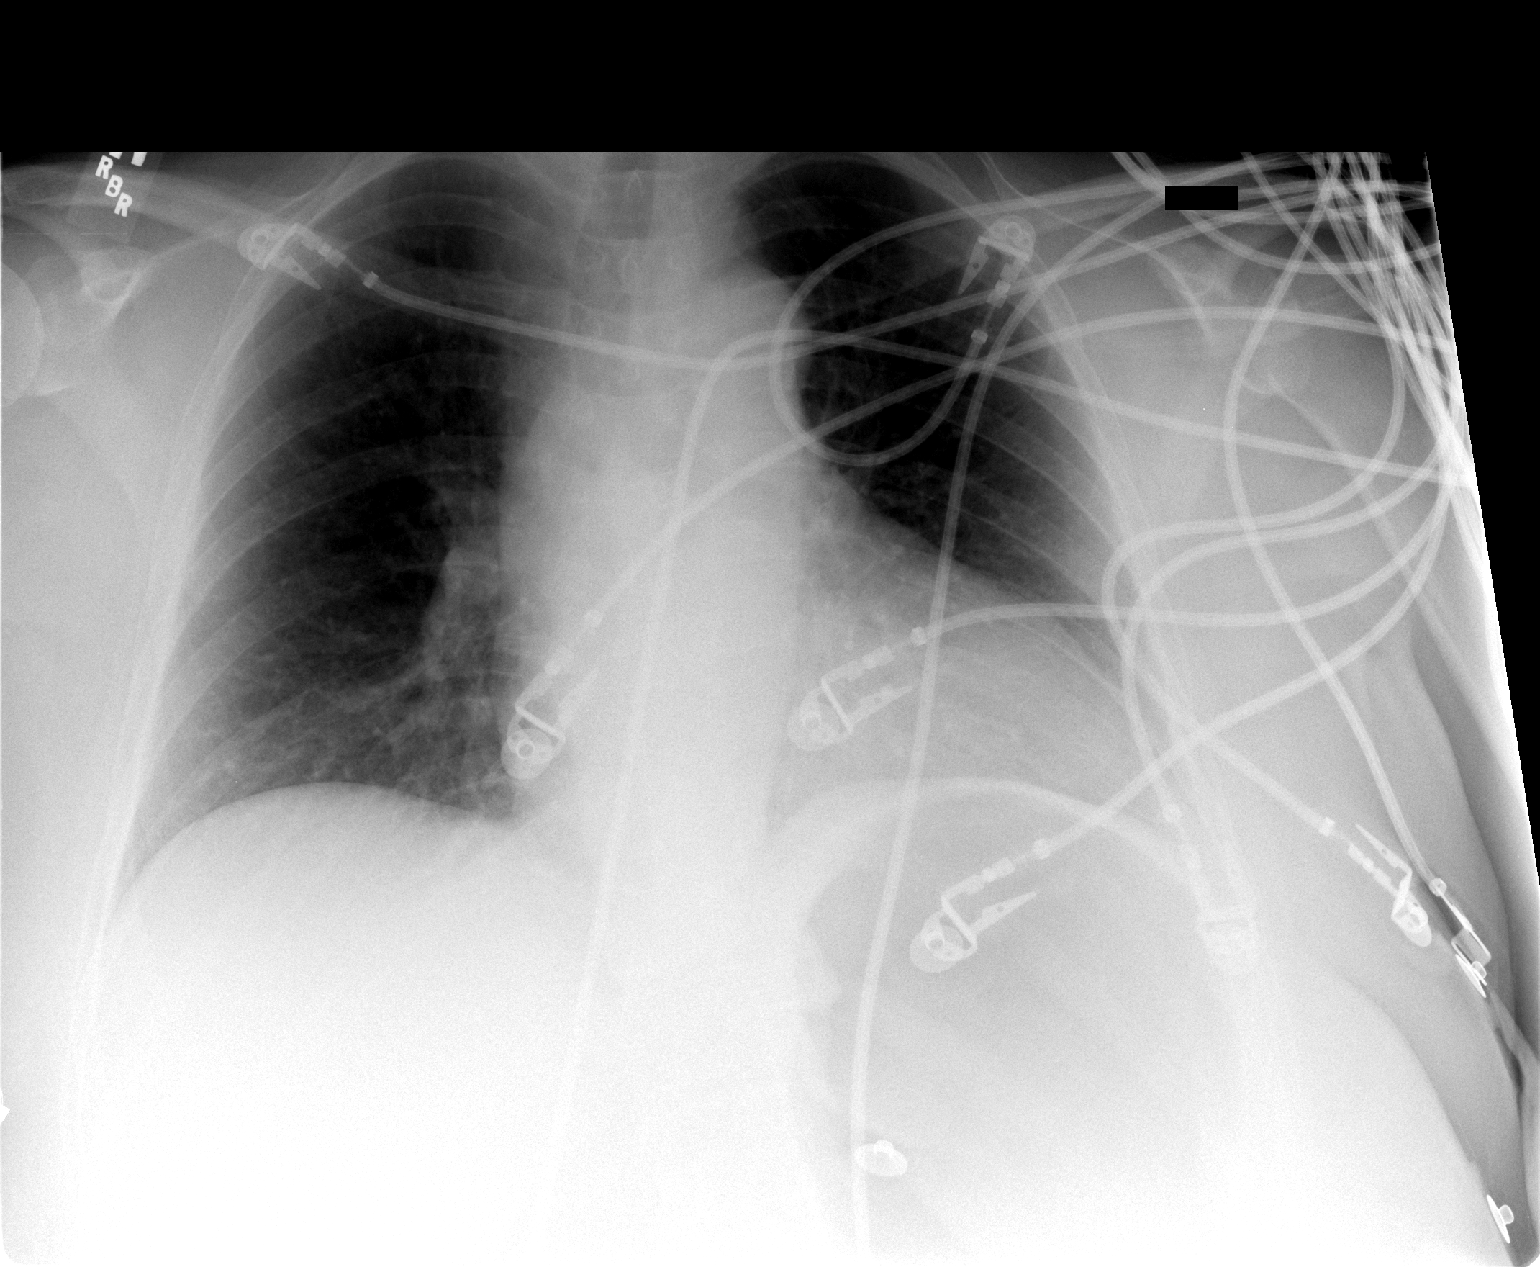

[1 of 1 positions shown; findings below may reference images not displayed]

FINDINGS: Heart, though accentuated by technique, is probably mildly enlarged.  No pulmonary edema.  No focal consolidation or pleural effusion.  Note is made of gaseous distention of the stomach.
IMPRESSION: Cardiomegaly without pulmonary edema.

## 2007-12-30 ENCOUNTER — Ambulatory Visit: Payer: Self-pay | Admitting: Pain Medicine

## 2008-01-06 ENCOUNTER — Ambulatory Visit: Payer: Self-pay | Admitting: Pain Medicine

## 2008-01-15 ENCOUNTER — Ambulatory Visit: Payer: Self-pay | Admitting: Pain Medicine

## 2008-01-22 ENCOUNTER — Ambulatory Visit: Payer: Self-pay | Admitting: Pain Medicine

## 2008-01-22 ENCOUNTER — Encounter (INDEPENDENT_AMBULATORY_CARE_PROVIDER_SITE_OTHER): Payer: Self-pay | Admitting: Internal Medicine

## 2008-01-22 ENCOUNTER — Encounter: Payer: Self-pay | Admitting: Family Medicine

## 2008-01-22 IMAGING — MR MR LUMBAR SPINE W/O CM
7 of 13 series · 21 of 48 positions shown · IV contrast (agent unspecified)
Comparison: none

CLINICAL DATA: Low back pain.
 MRI LUMBAR SPINE WITHOUT CONTRAST:
TECHNIQUE: Multiplanar and multiecho pulse sequences of the lumbar spine, to include the lower thoracic and upper sacral regions, were obtained according to standard protocol without IV contrast.
 This study is interpreted accounting for five non-rib-bearing lumbar vertebrae with the last major disk level denoting L5-S1.
 Conus terminates at L1, which is unremarkable.  The paravertebral structures are normal.  
 Alignment of the spine is anatomic.  Diffuse lumbar disk desiccation.  No appreciable central nor foraminal stenosis.  Facet degenerative changes in lower lumbar spine.  
 Axial imaging:
 There is no disk herniation, central nor foraminal stenosis.  Facet degenerative changes are appreciated at the L4-5 and L5-S1 levels.  There is no central nor foraminal stenosis.

[Series 3: T2 · sagittal · 3.0mm · 0.43mm/px · 2 of 11 slices shown (1 of 3)]
[im 1/11]
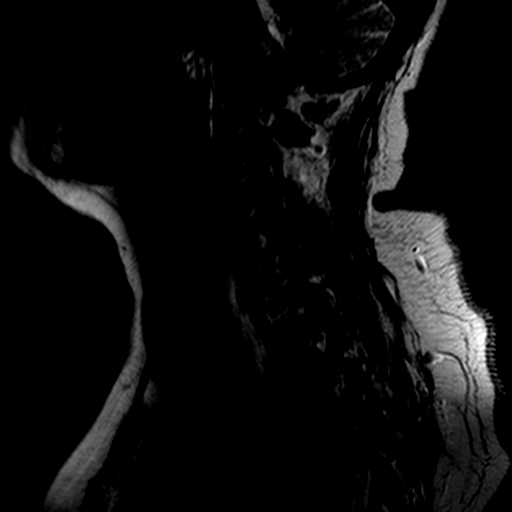
[im 11/11]
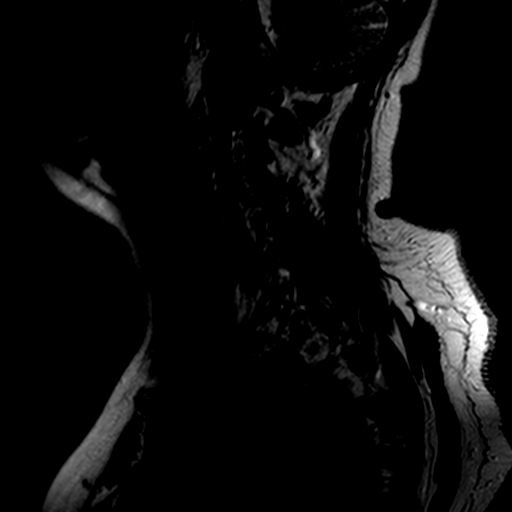

[Series 4: T1 · sagittal · 3.0mm · 0.43mm/px · 2 of 11 slices shown]
[im 1/11]
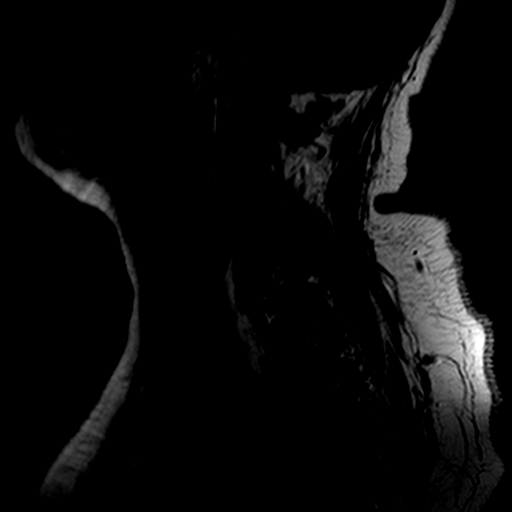
[im 11/11]
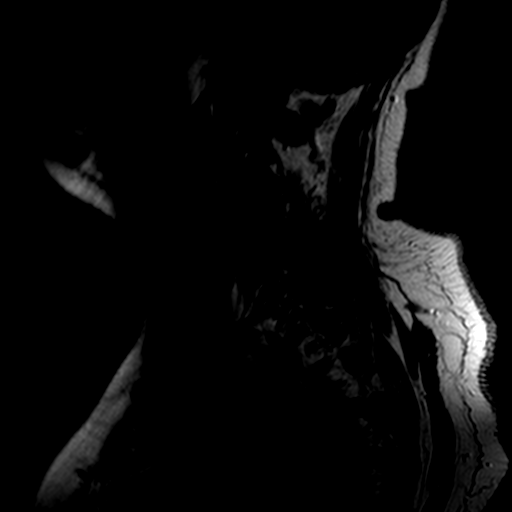

[Series 5: PD · sagittal · 3.0mm · 0.43mm/px · 2 of 11 slices shown]
[im 1/11]
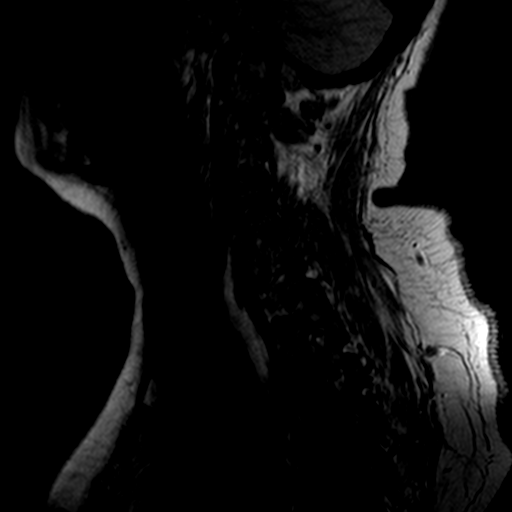
[im 11/11]
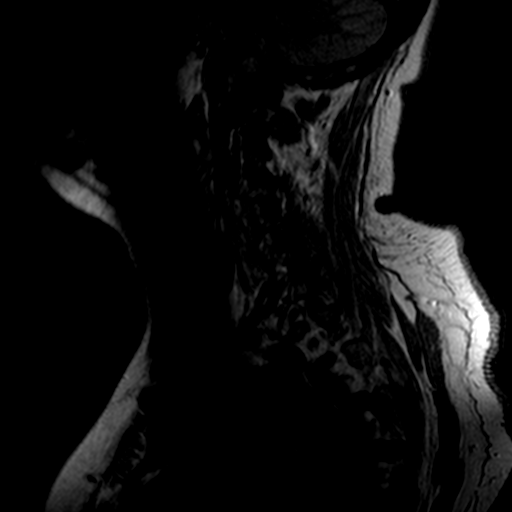

[Series 6: tir sag · sagittal · 3.0mm · 0.43mm/px · 2 of 11 slices shown]
[im 1/11]
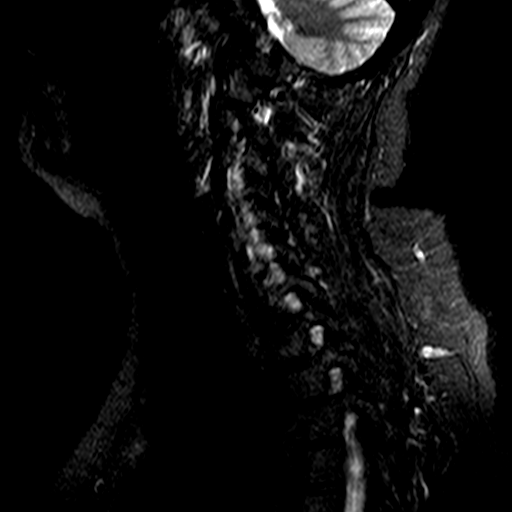
[im 11/11]
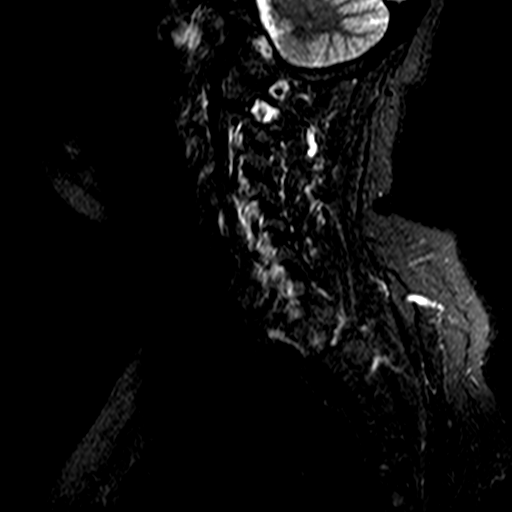

[Series 7: T2 · axial · 4.0mm · 0.39mm/px · z∈[-78,+8]mm · 4 of 21 slices shown (2 of 3)]
[im 1/21]
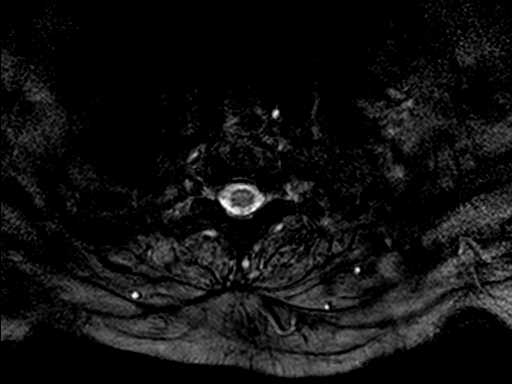
[im 7/21]
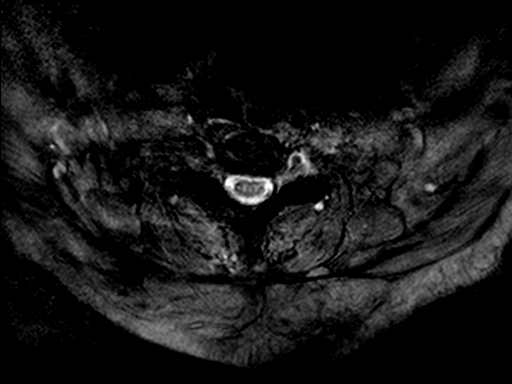
[im 14/21]
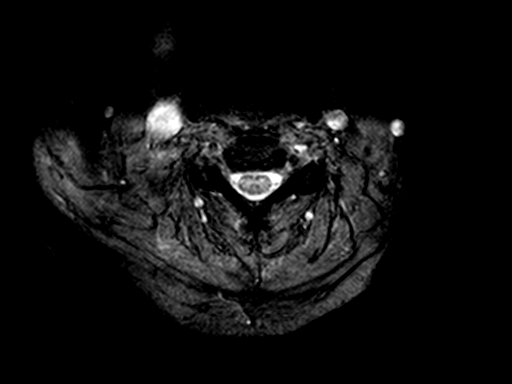
[im 21/21]
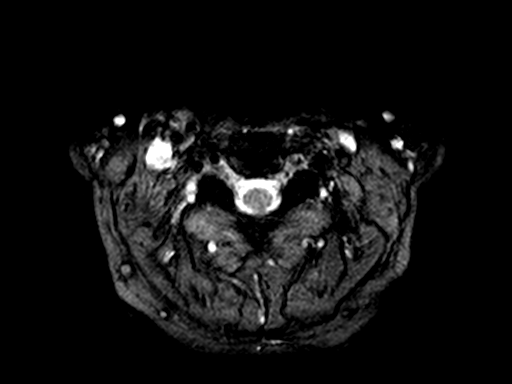

[Series 9: T2 · axial · 4.0mm · 0.62mm/px · z∈[-79,+6]mm · 4 of 21 slices shown (3 of 3)]
[im 1/21]
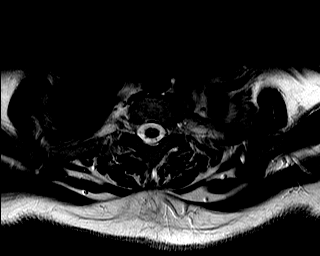
[im 7/21]
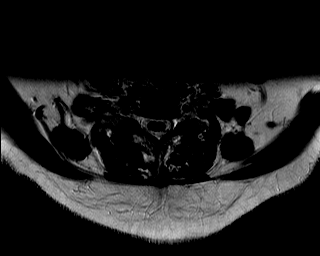
[im 14/21]
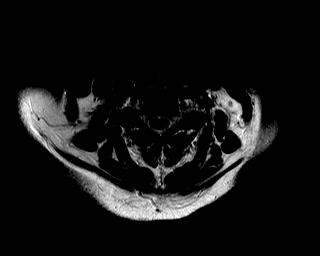
[im 21/21]
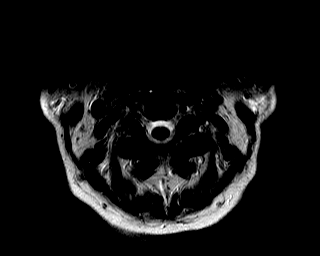

[Series 11: bSSFP · axial · 1.0mm · 0.31mm/px · z∈[-82,-33]mm · 5 of 96 slices shown]
[im 7/96]
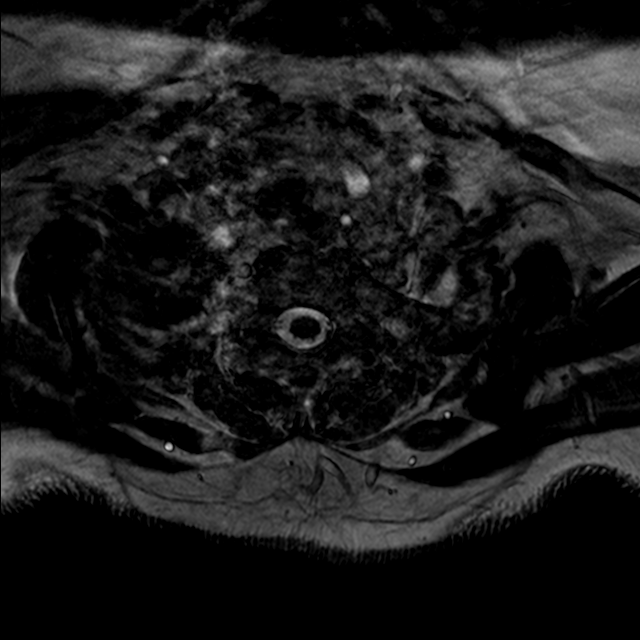
[im 20/96]
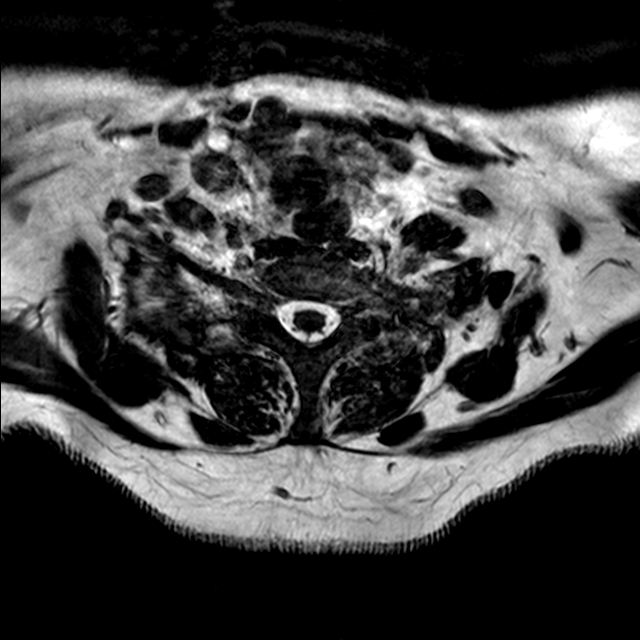
[im 32/96]
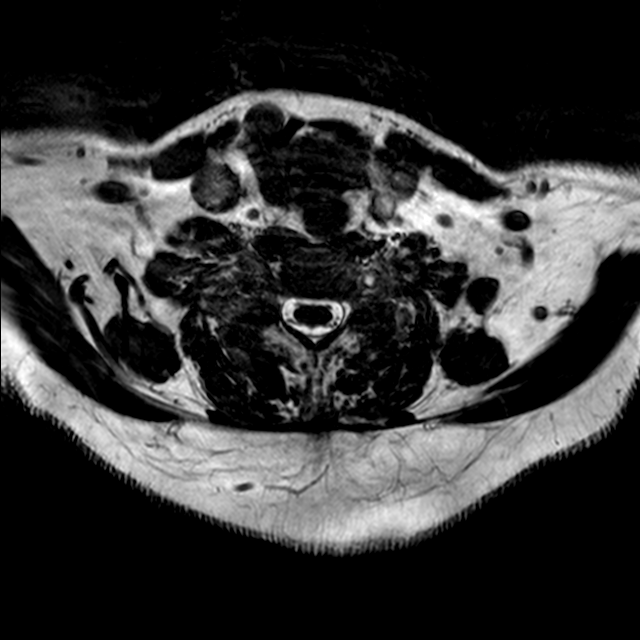
[im 45/96]
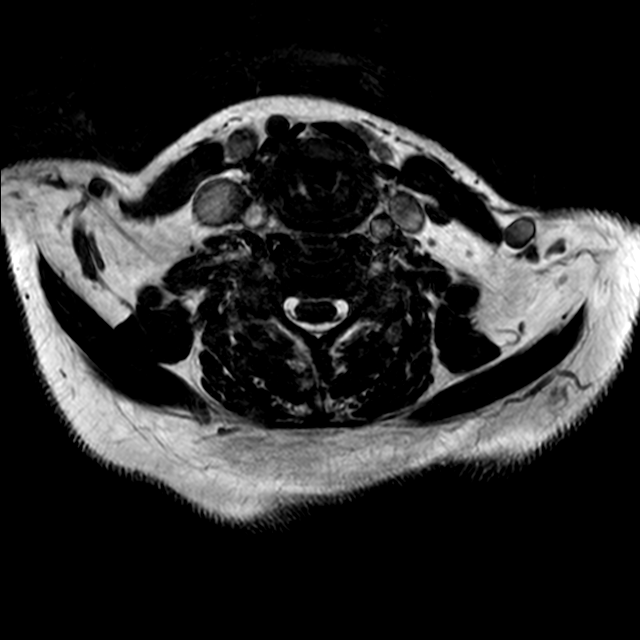
[im 58/96]
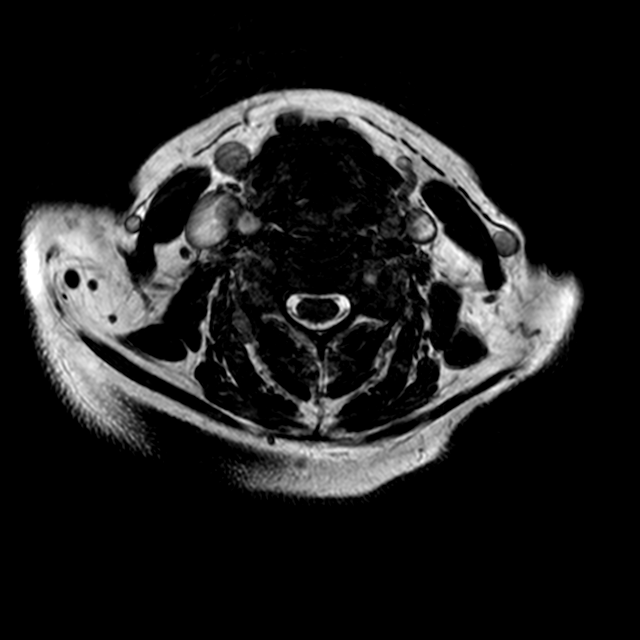

[21 of 48 positions shown; findings below may reference images not displayed]

IMPRESSION: Facet degenerative changes, lower lumbar spine.

## 2008-01-23 ENCOUNTER — Encounter: Payer: Self-pay | Admitting: Family Medicine

## 2008-01-27 ENCOUNTER — Ambulatory Visit: Payer: Self-pay | Admitting: Pain Medicine

## 2008-02-12 ENCOUNTER — Ambulatory Visit: Payer: Self-pay | Admitting: Physician Assistant

## 2008-02-12 IMAGING — CT CT PELVIS W/ CM
1 of 3 series · 14 of 32 positions shown, 19 images · IV contrast (omnipaque)
Comparison: 02/02/2006.

CLINICAL DATA: 50 year-old female with epigastric abdominal pain, nausea, vomiting, diarrhea with an elevated white count.
ABDOMEN CT WITH CONTRAST:
TECHNIQUE: Multidetector CT imaging of the abdomen was performed following the standard protocol during bolus administration of intravenous contrast.
Contrast:  326cc Omnipaque 300.
TECHNIQUE: Multidetector CT imaging of the pelvis was performed following the standard protocol during bolus administration of intravenous contrast.

[Series 2: abd/pel 5.0 b30f · axial · 0.74mm/px · z∈[-484,-44]mm · 14 of 98 slices shown, 19 images]
[im 5/98  soft-tissue]
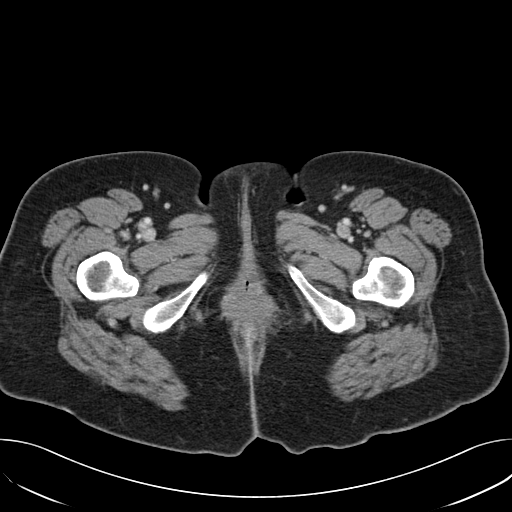
[im 5/98  bone]
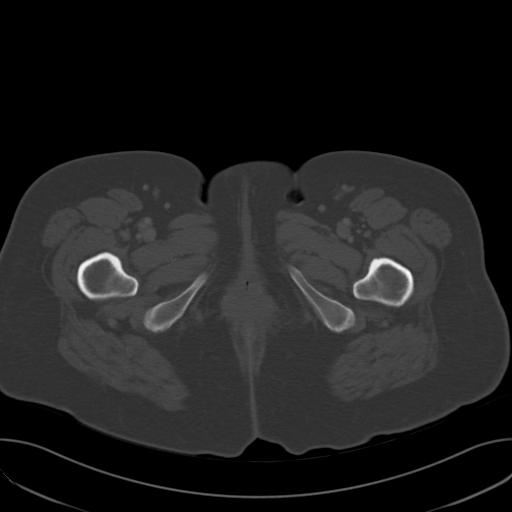
[im 15/98  soft-tissue]
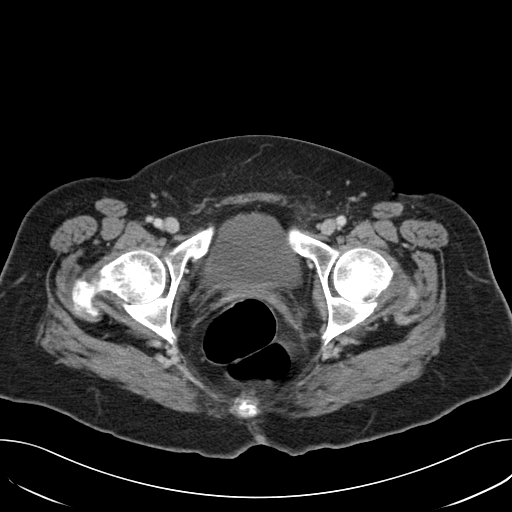
[im 20/98  soft-tissue]
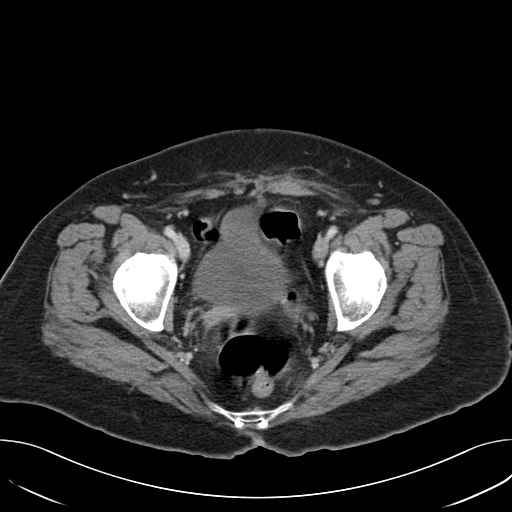
[im 30/98  soft-tissue]
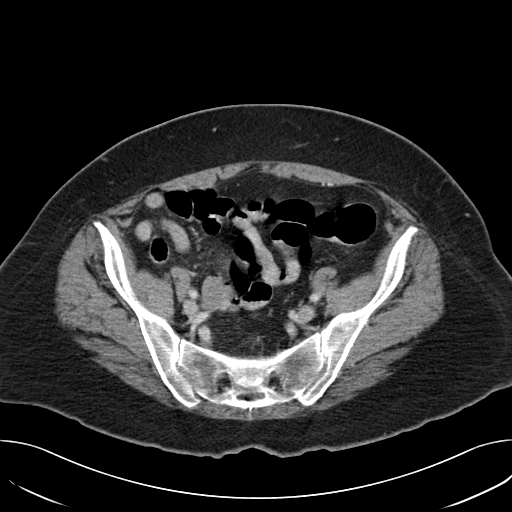
[im 34/98  soft-tissue]
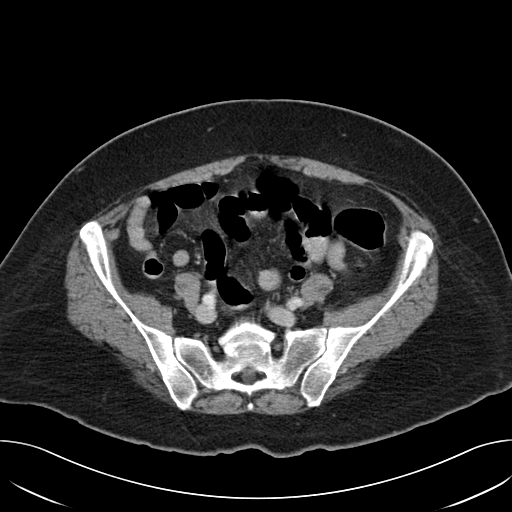
[im 44/98  soft-tissue]
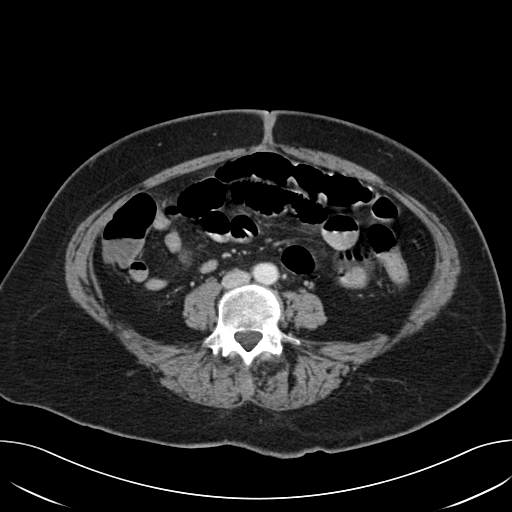
[im 49/98  soft-tissue]
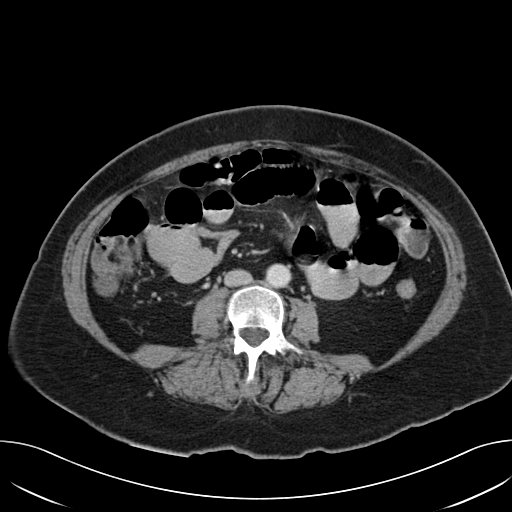
[im 54/98  soft-tissue]
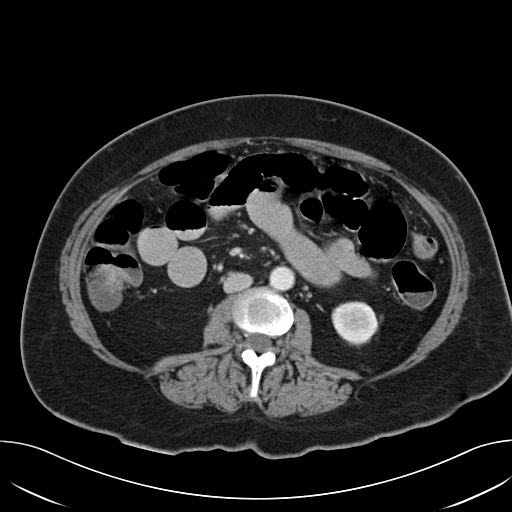
[im 64/98  soft-tissue]
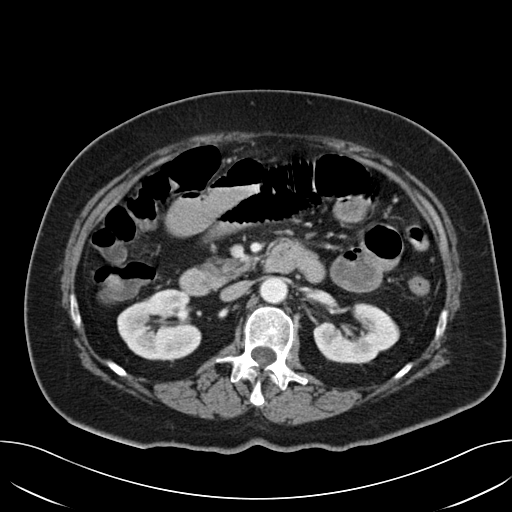
[im 64/98  bone]
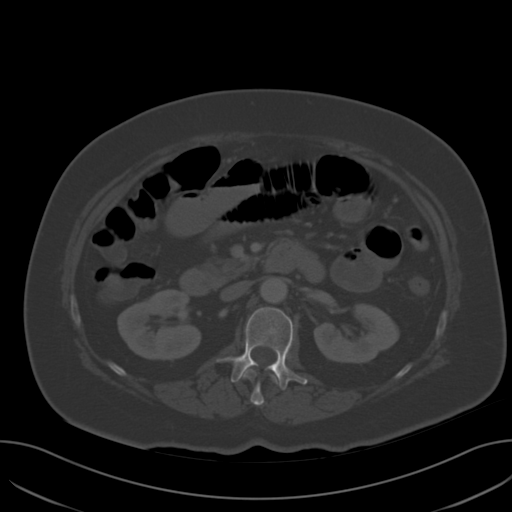
[im 68/98  soft-tissue]
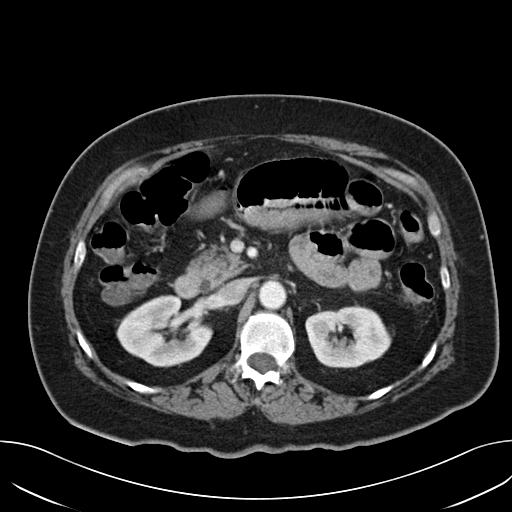
[im 78/98  soft-tissue]
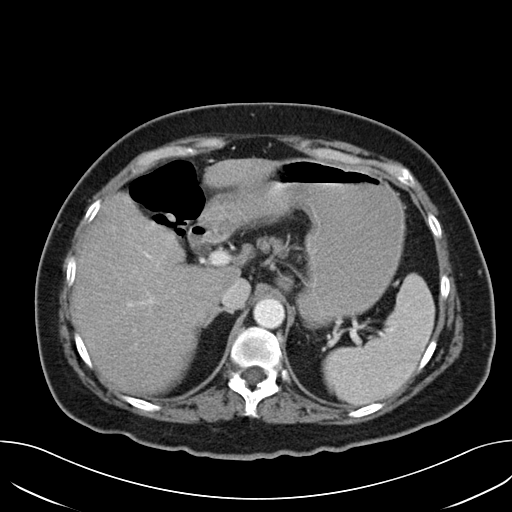
[im 78/98  lung]
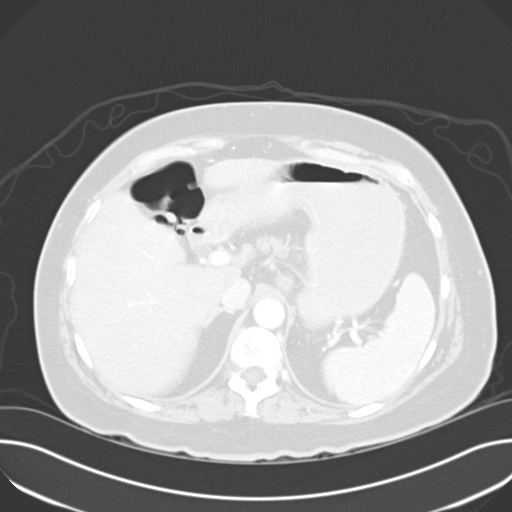
[im 83/98  soft-tissue]
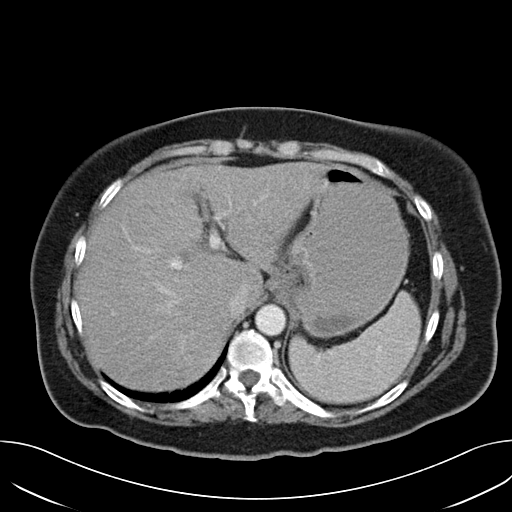
[im 83/98  lung]
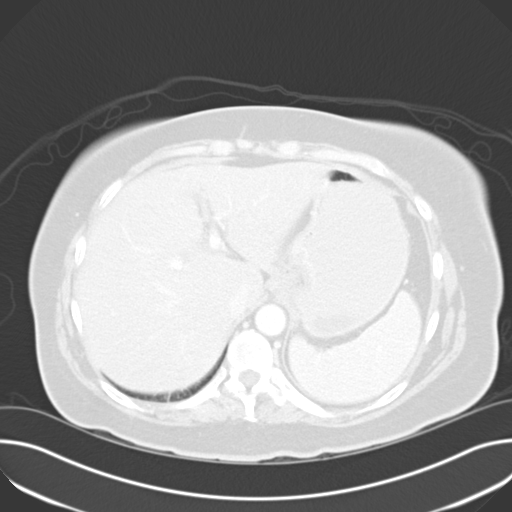
[im 88/98  lung]
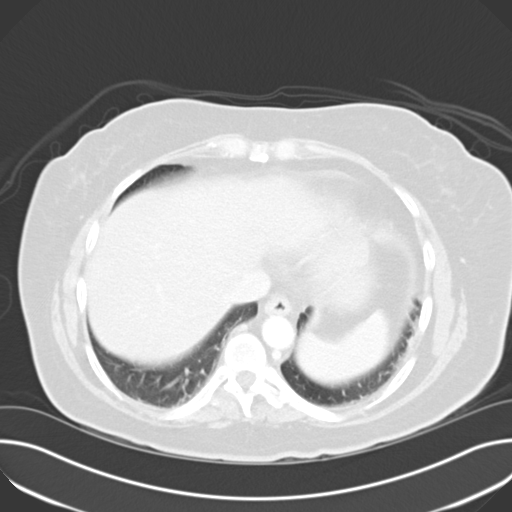
[im 93/98  soft-tissue]
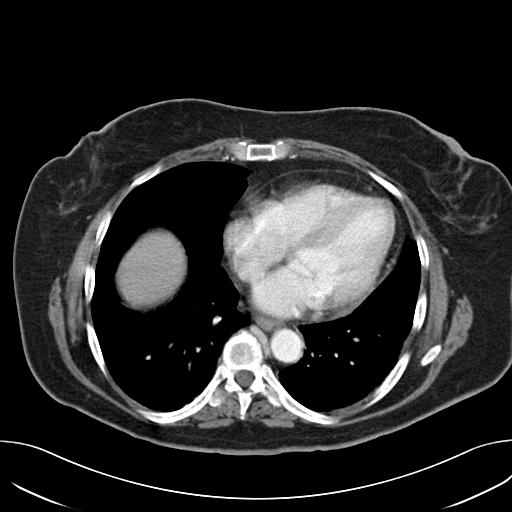
[im 93/98  lung]
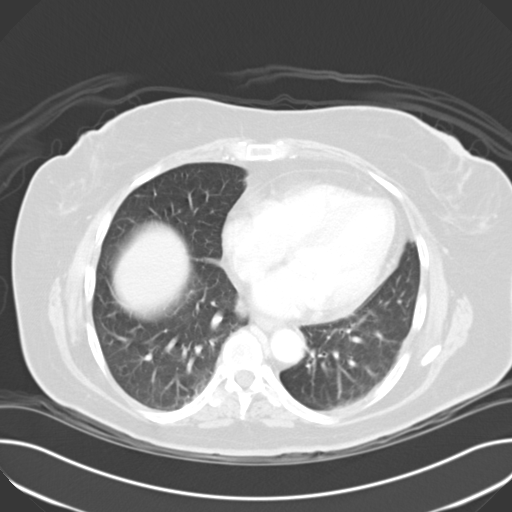

[14 of 32 positions shown; findings below may reference images not displayed]

FINDINGS: Minimal dependent basilar atelectasis.  No pericardial or pleural fluid.  In the abdomen, there is thickening at the gastroesophageal junction with fat deposition along this region on image #15.  This is stable compared to the prior exams of 02/02/2006 and 04/11/2005.  However, if the patient has epigastric pain further evaluation could be performed with an upper GI series.  The liver, biliary system, kidneys, adrenal glands, pancreas and spleen are normal.  Small bowel is dilated with air-fluid levels diffusely consistent with enteritis vs. partial small bowel obstruction.  No wall thickening, mesenteric edema, significant adenopathy, or free air.
IMPRESSION: 1. Dilated small bowel diffusely with air-fluid levels consistent with partial small bowel obstruction vs. diffuse enteritis.
2. Gastroesophageal junction thickening as before.  With a history of epigastric pain, upper GI series as follow-up may be helpful.
3. Status-post cholecystectomy.
PELVIS CT WITH CONTRAST:
FINDINGS: Diverticulosis is evident of the colon.  Fluid levels are present in the bowel diffusely.  No adenopathy, acute inflammation, free fluid, or hemorrhage.  The patient had a hysterectomy.
IMPRESSION: Diverticulosis.  Diffuse bowel fluid levels, as above.

## 2008-02-12 IMAGING — CR DG ABDOMEN ACUTE W/ 1V CHEST
3 series · 3 of 3 positions shown · non-contrast
Comparison: none

CLINICAL DATA: Upper abdominal pain, nausea and vomiting, diarrhea. 
 ACUTE ABDOMEN SERIES WITH CHEST ? 03/21/06:
 Comparing CT of the abdomen from 02/02/06.

[w chest pa]
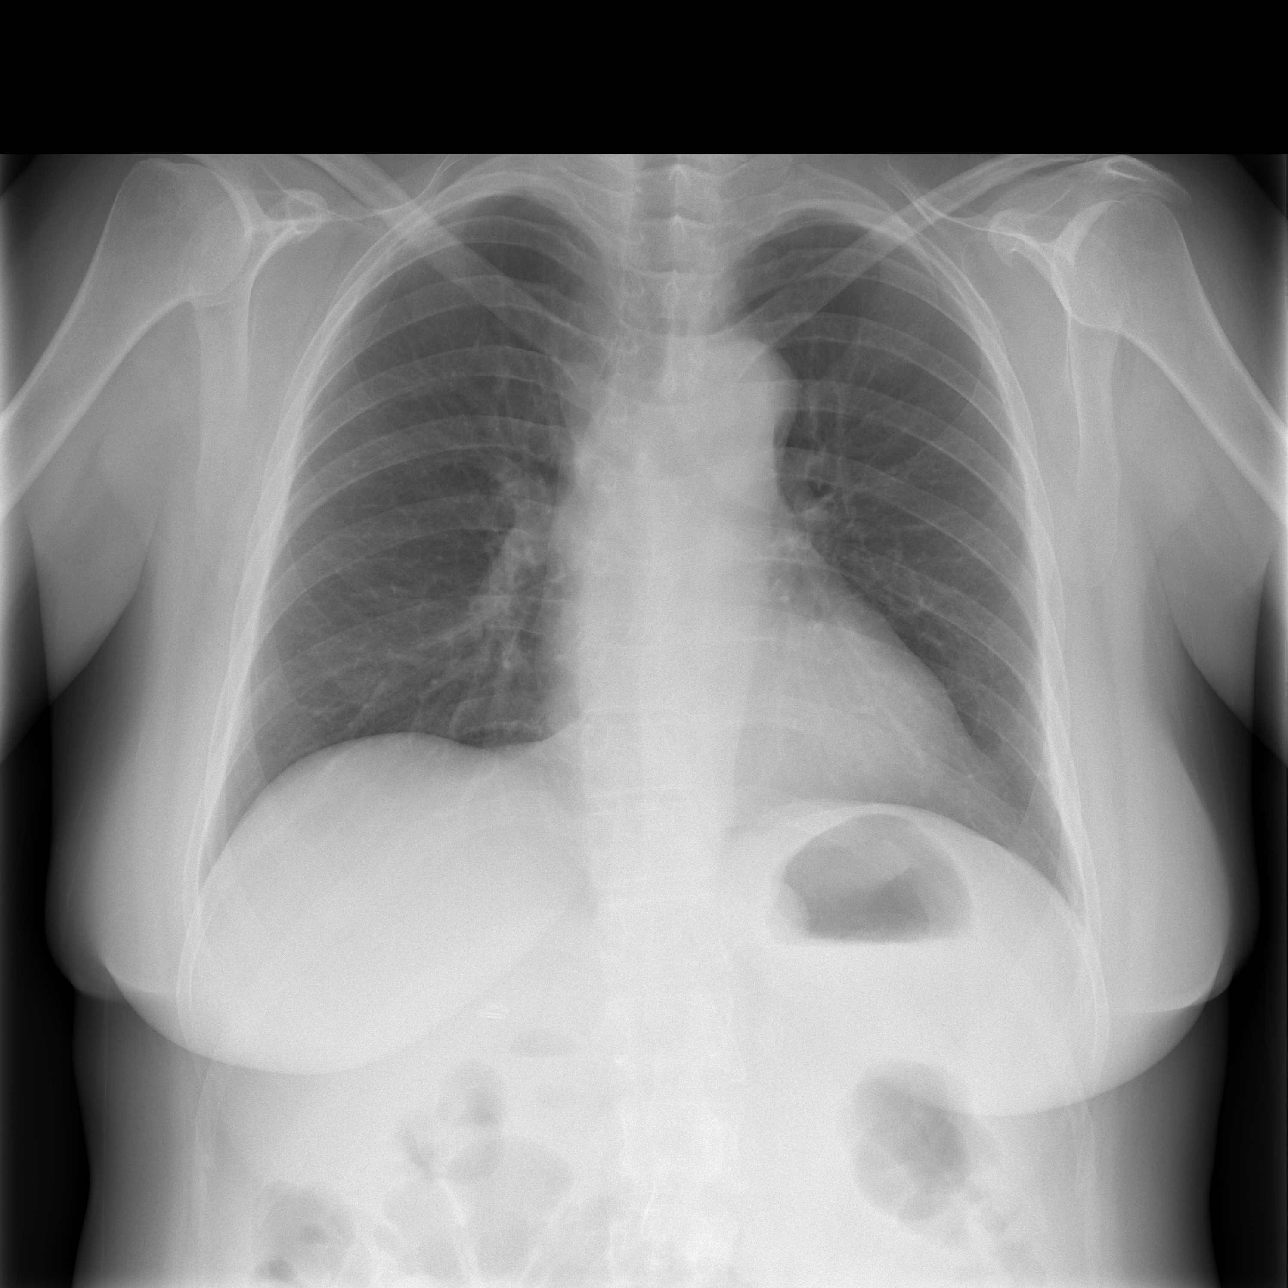

[w abdomen upright *]
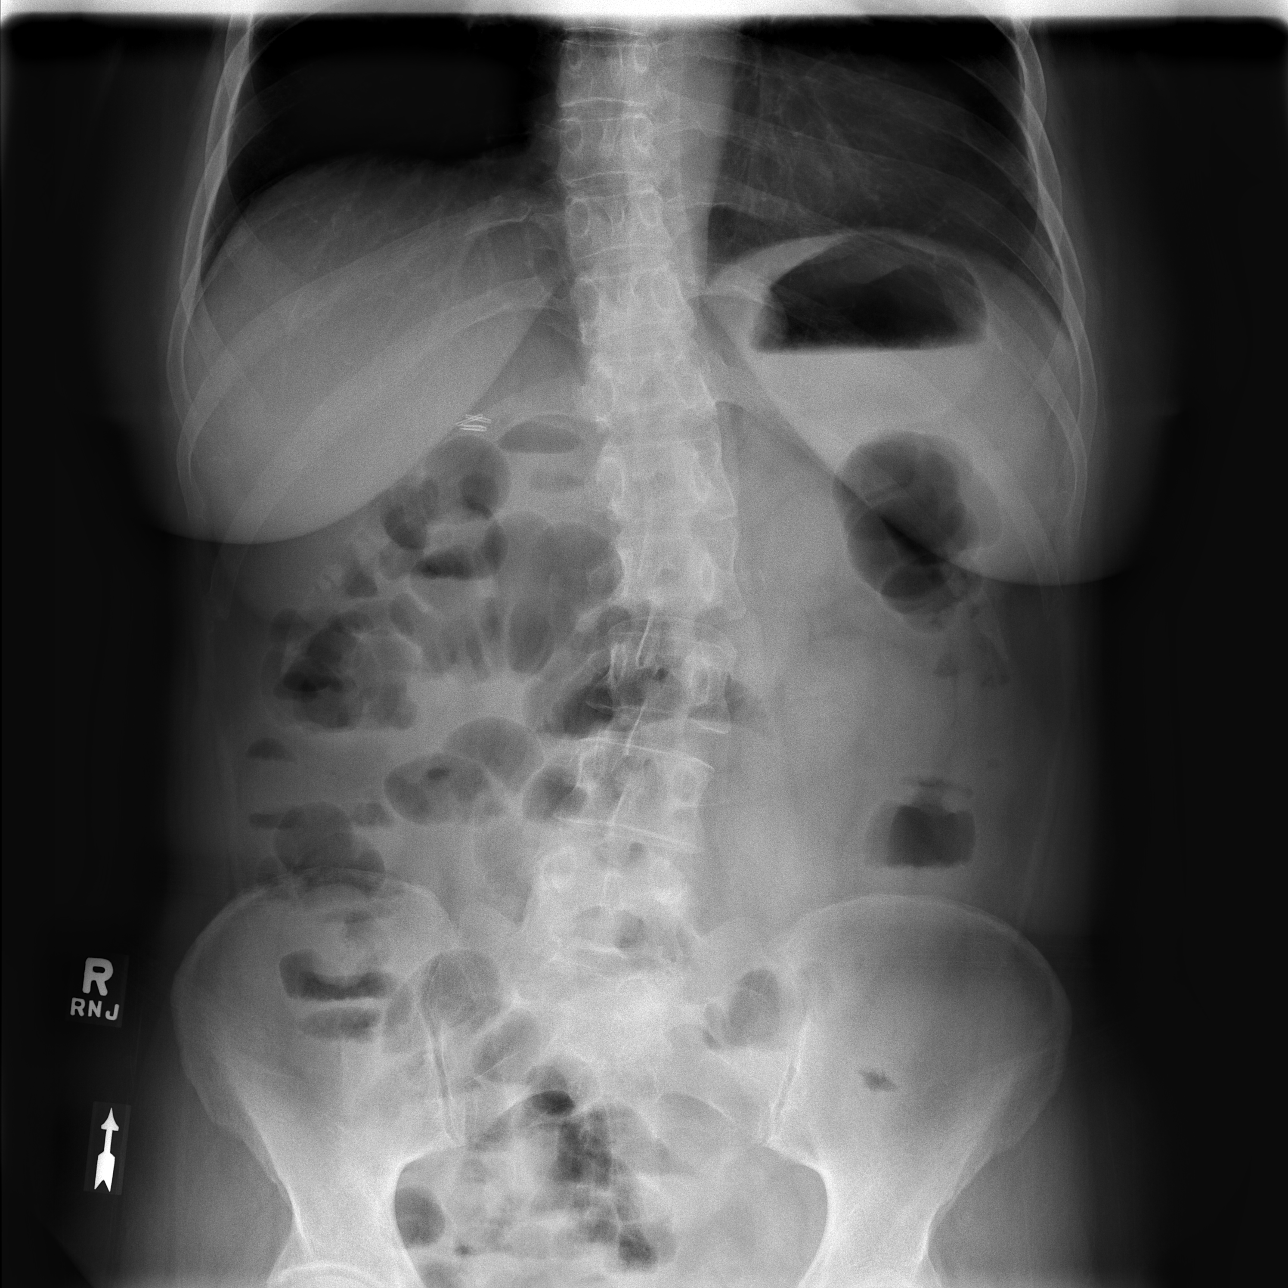

[t abdomen supine]
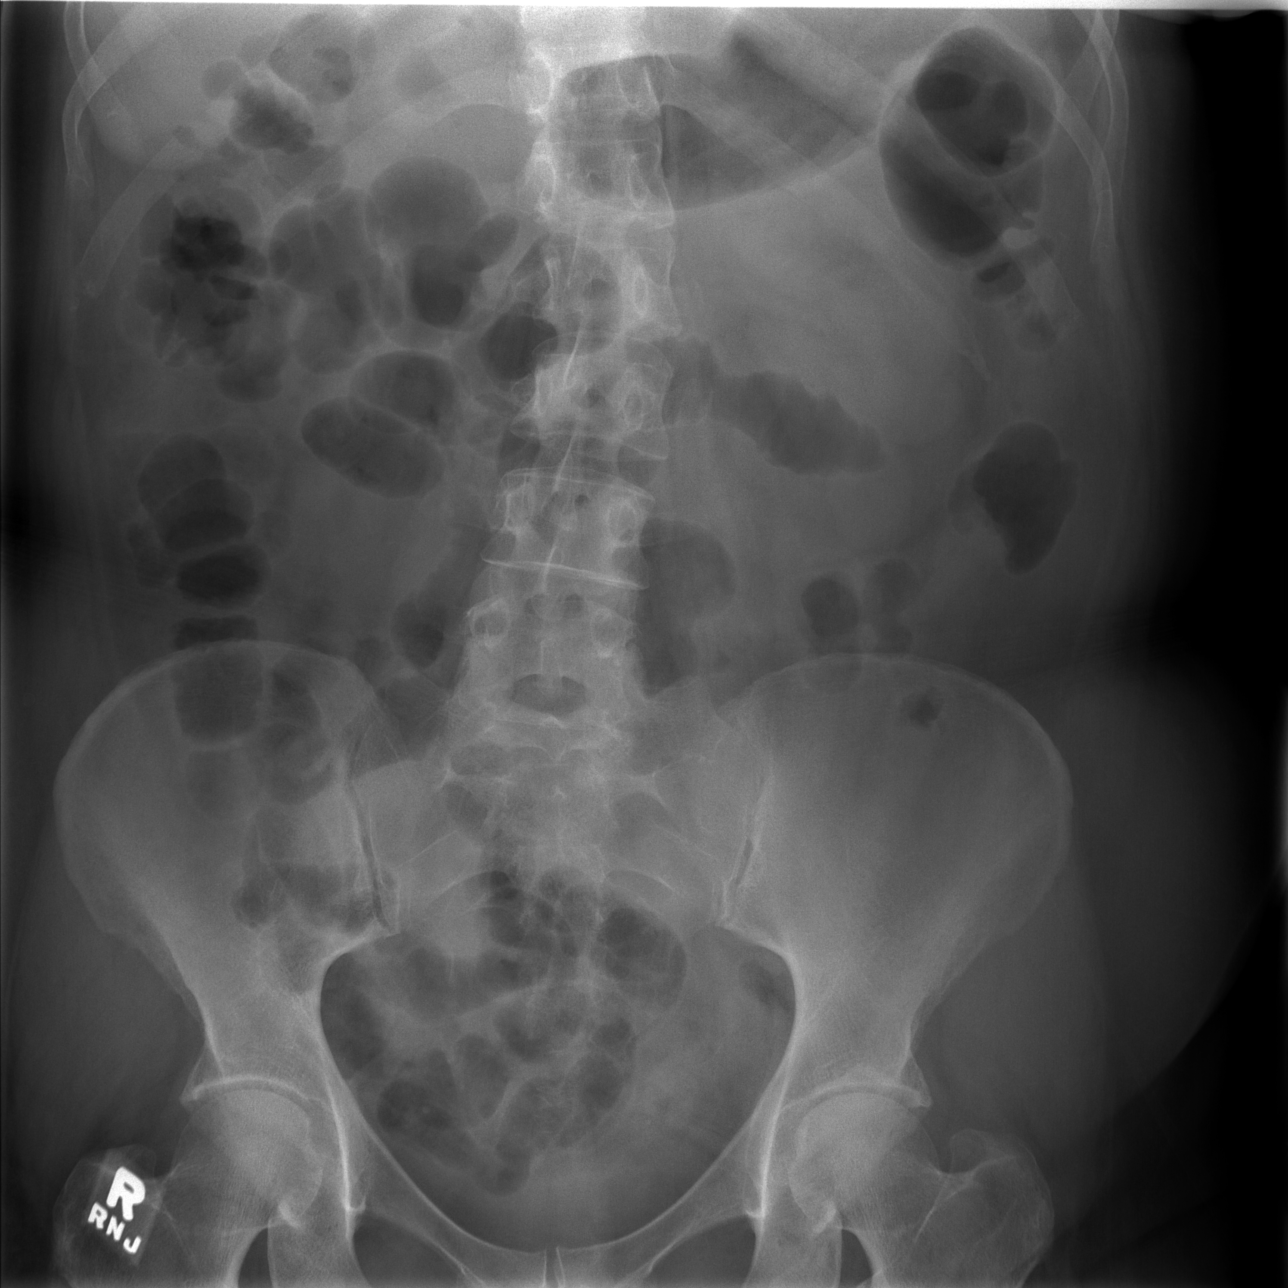

[3 of 3 positions shown; findings below may reference images not displayed]

FINDINGS: The lungs appear clear and the heart and mediastinum normal in radiographic appearance.  No free intraperitoneal gas is evident.   
 There are scattered air fluid levels in the colon, including the descending colon. There are likely scattered air fluid levels in nondilated loops of small bowel. The appearance favors ileus or diarrheal process.
IMPRESSION: Scattered air fluid levels in nondilated loops of small and large bowel, favoring ileus or diarrheal process.

## 2008-02-13 IMAGING — CR DG ABDOMEN 2V
2 series · 2 of 2 positions shown · non-contrast
Comparison: 03/21/06.

CLINICAL DATA: Abdominal pain.  Follow up ileus. 
 ABDOMEN ? 2 VIEW:

[w abdomen upright]
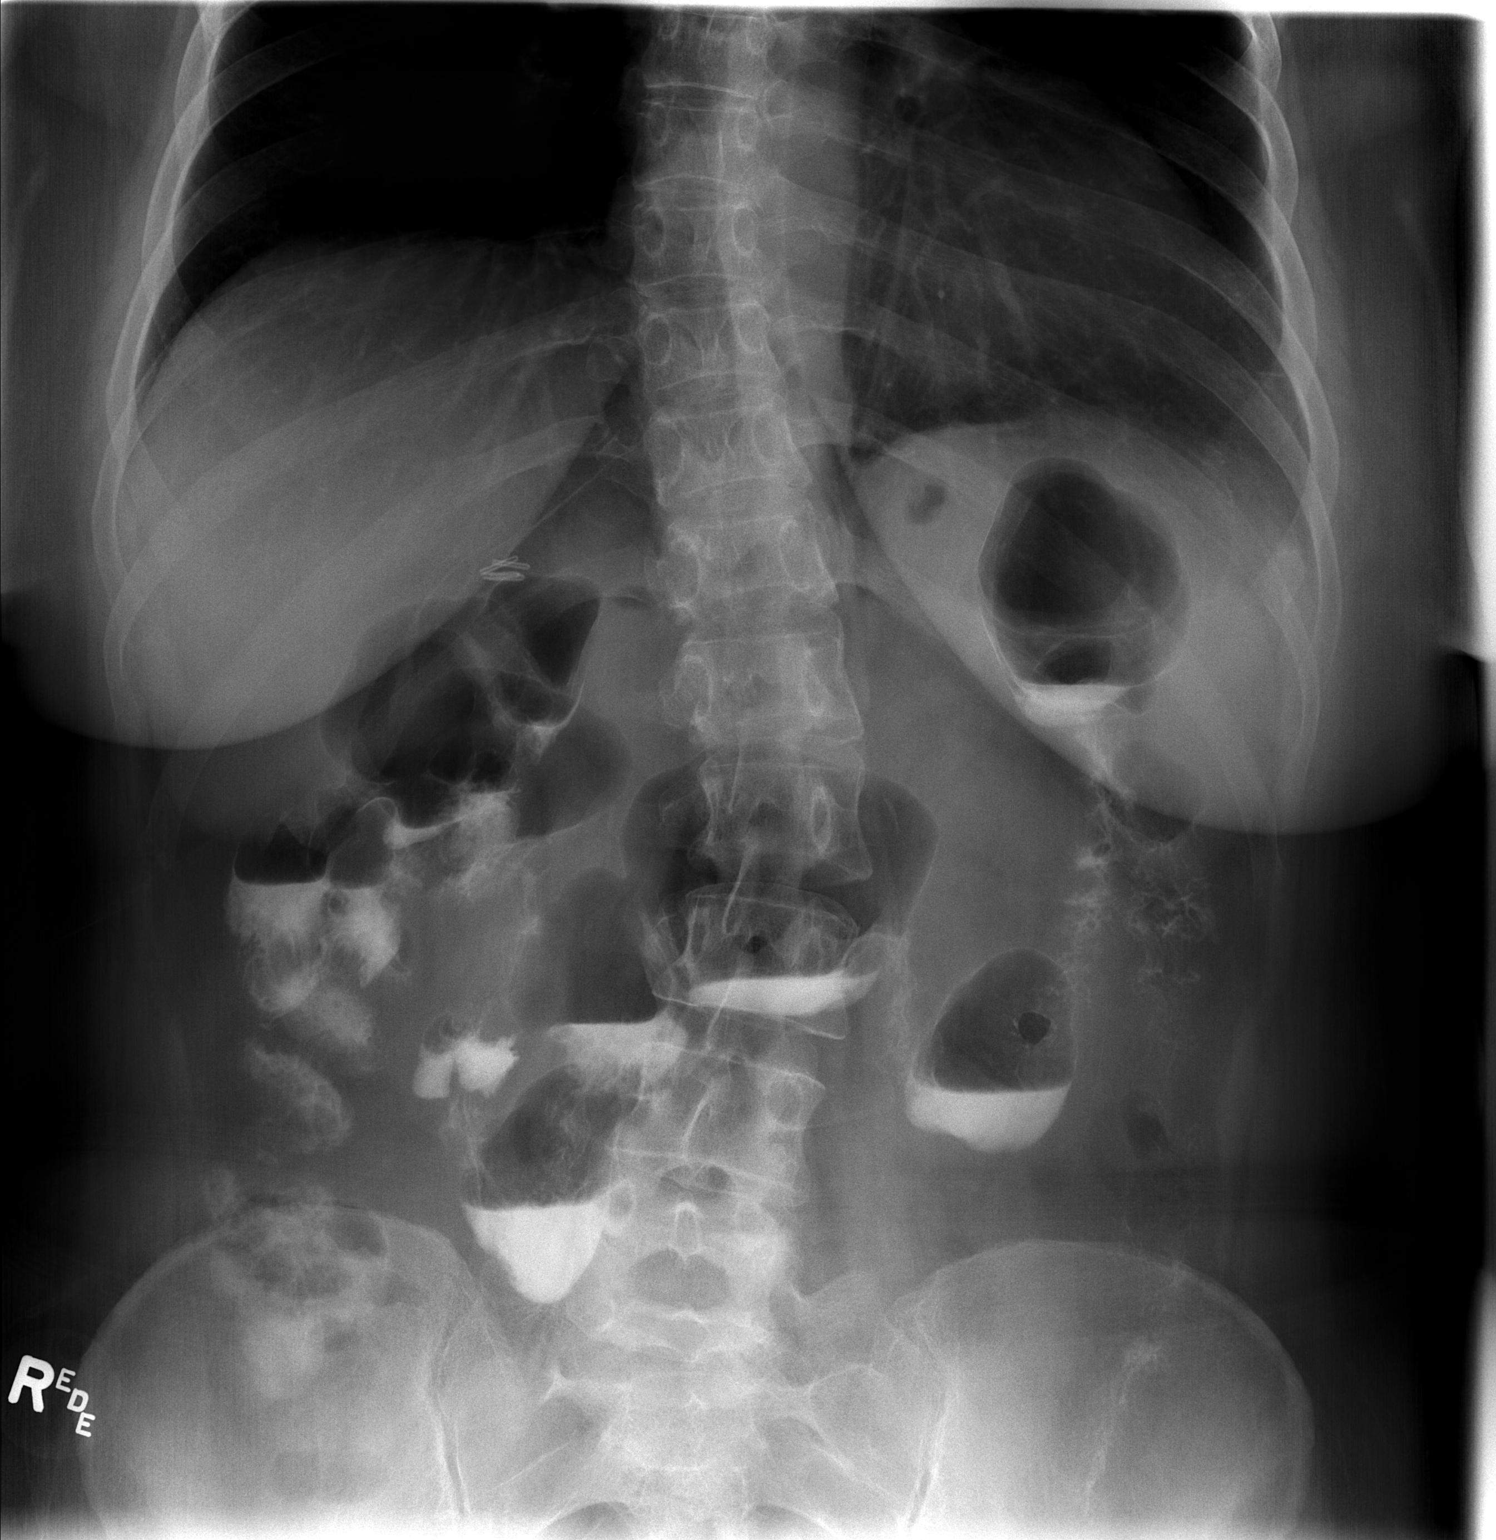

[t abdomen supine]
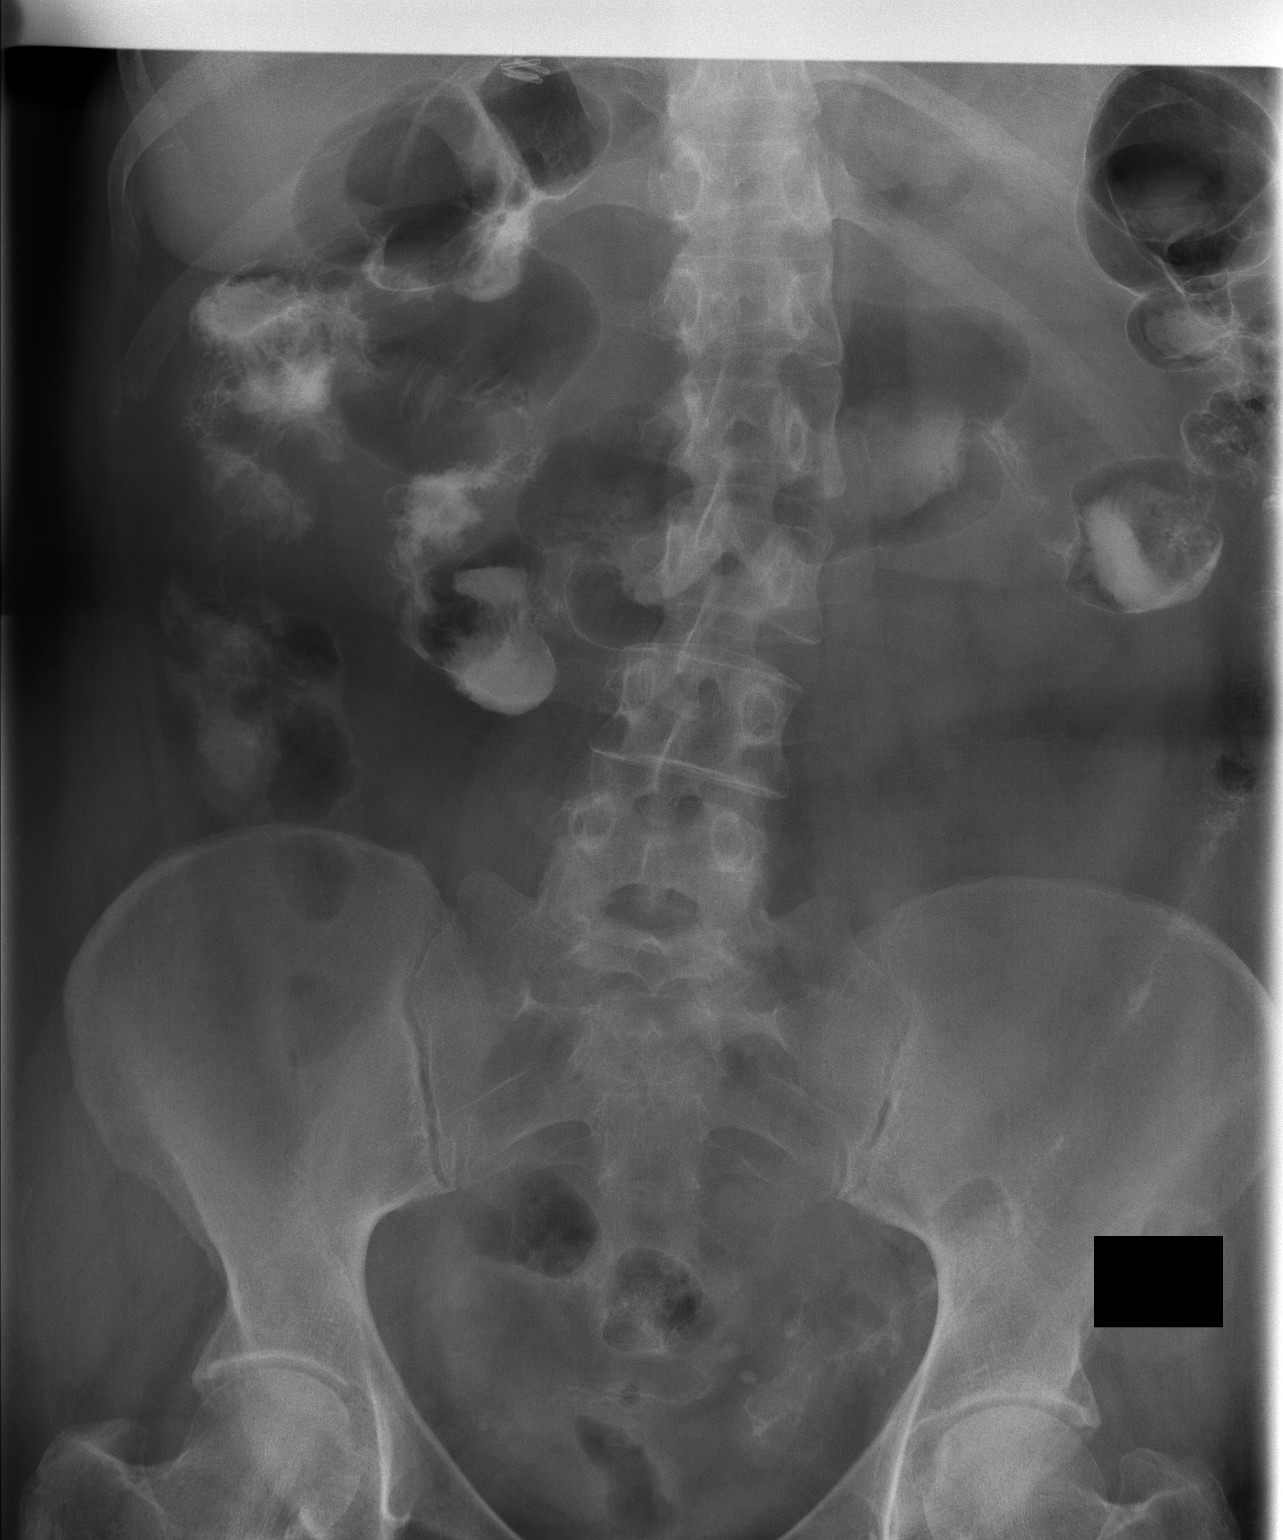

[2 of 2 positions shown; findings below may reference images not displayed]

FINDINGS: Gas and air-fluid levels are noted in multiple bowel loops, I believe mainly colonic. No significant small bowel distention.  Decrease in small bowel gas since the prior exam.  No extraluminal gas.  Metallic clips, right upper quadrant, secondary to cholecystectomy.
IMPRESSION: Findings compatible with mild transverse colon ileus.  No findings to strongly suggest bowel obstruction.  Decrease in small bowel gas since the prior exam.

## 2008-03-05 IMAGING — CR DG ABDOMEN ACUTE W/ 1V CHEST
3 series · 3 of 3 positions shown · non-contrast
Comparison: 03/22/2006 abdomen films.

CLINICAL DATA: Abdominal pain

[w chest pa]
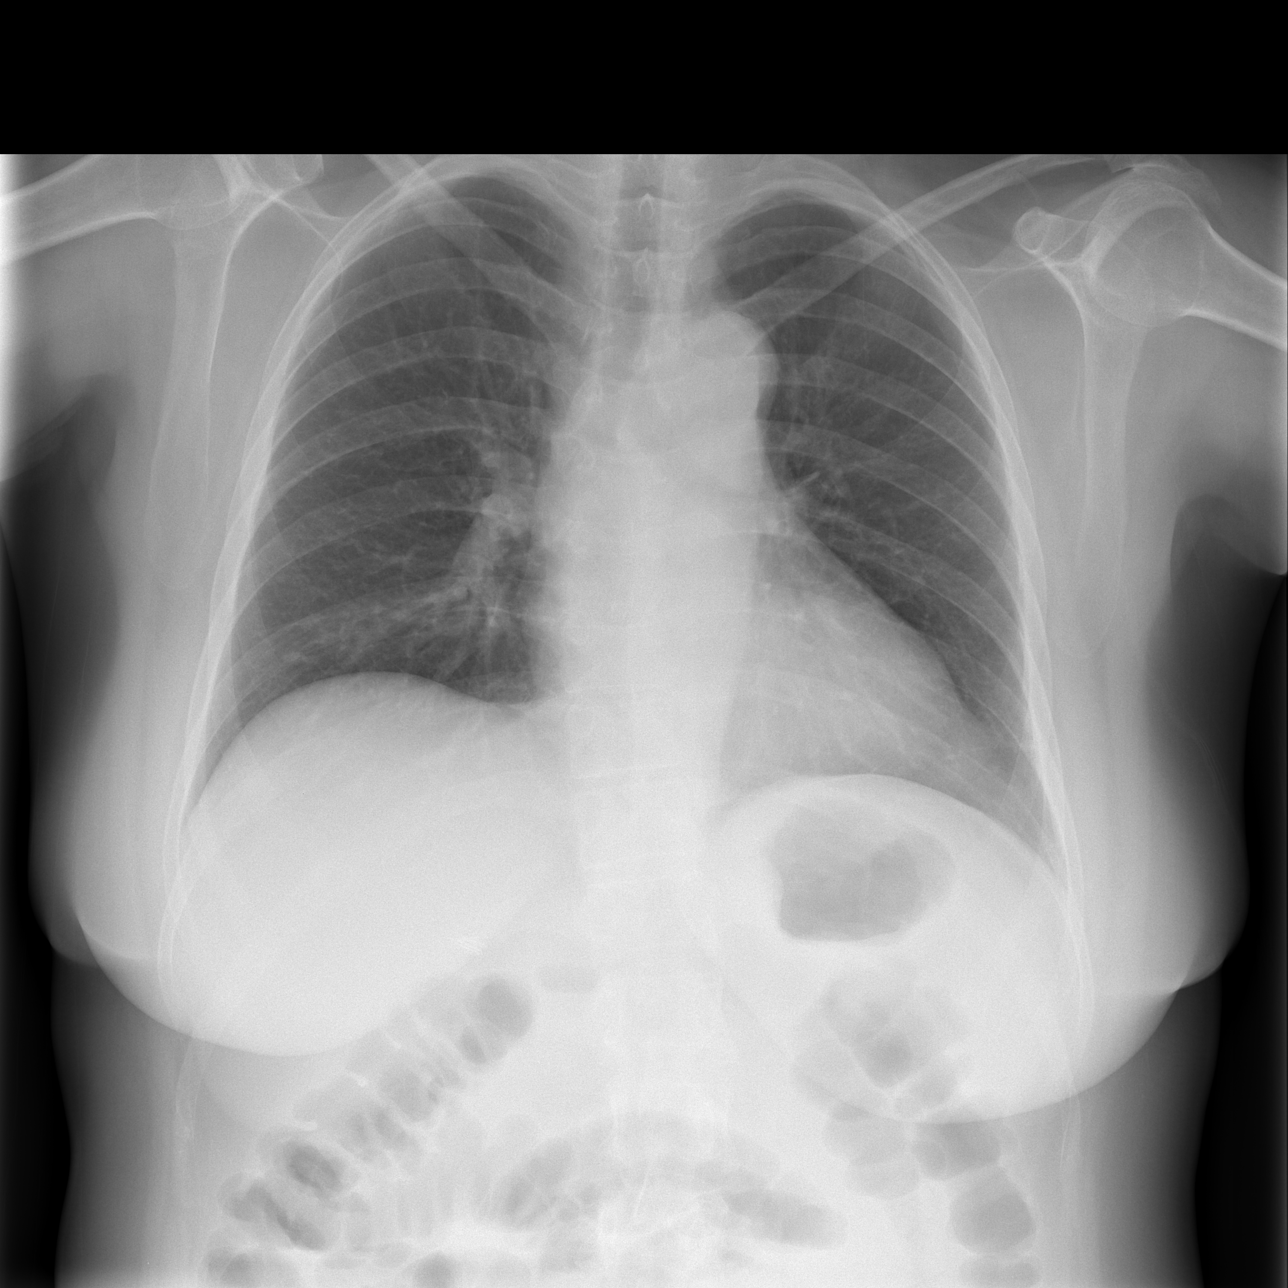

[w abdomen upright *]
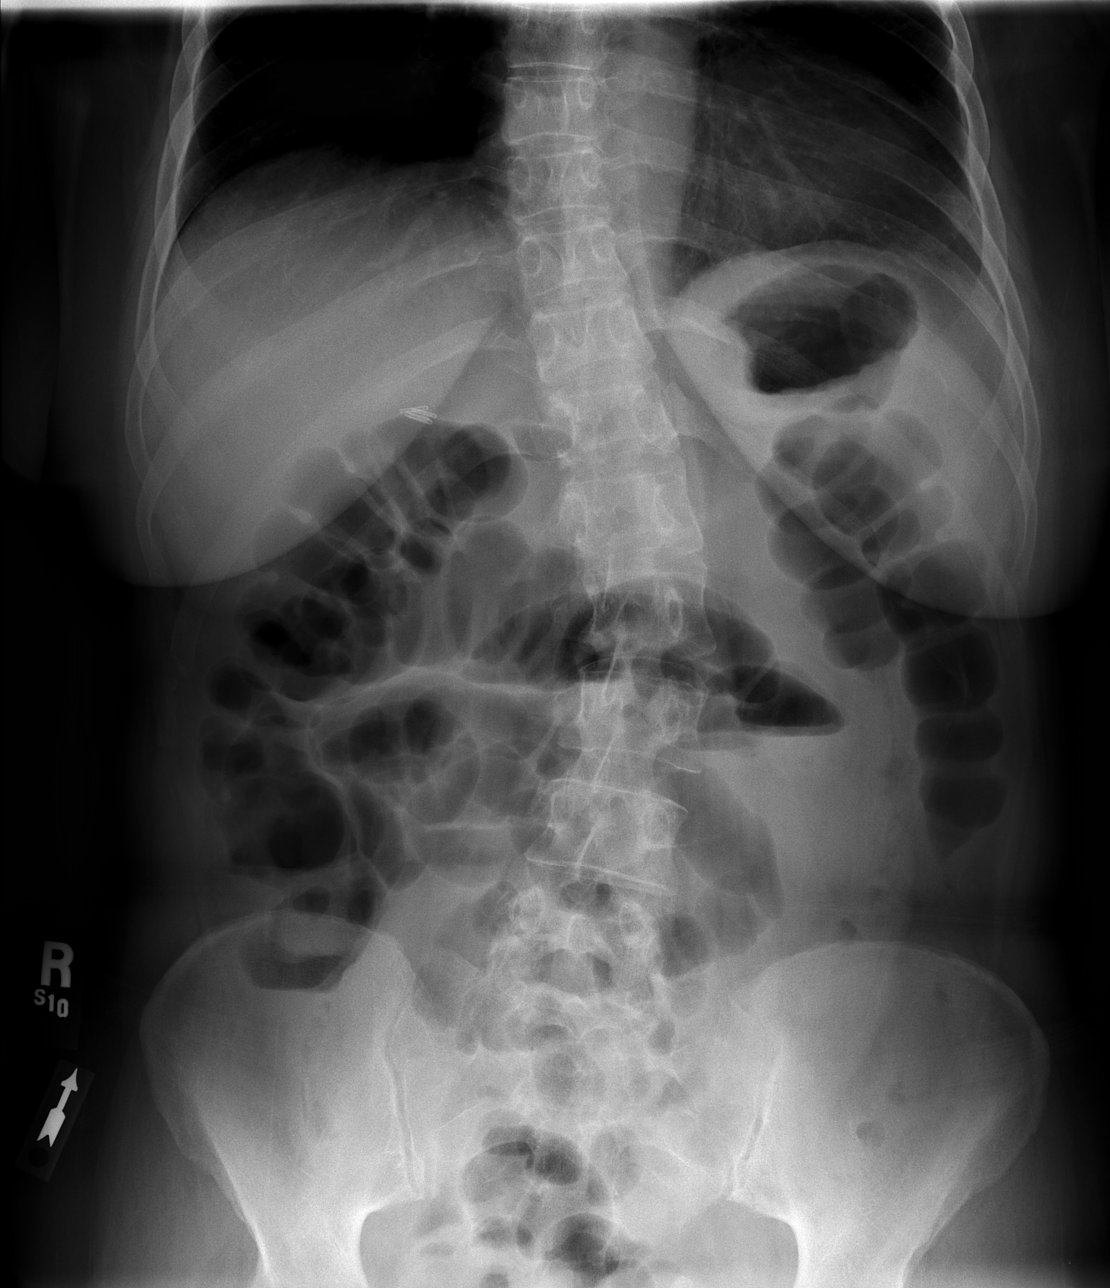

[t abdomen supine]
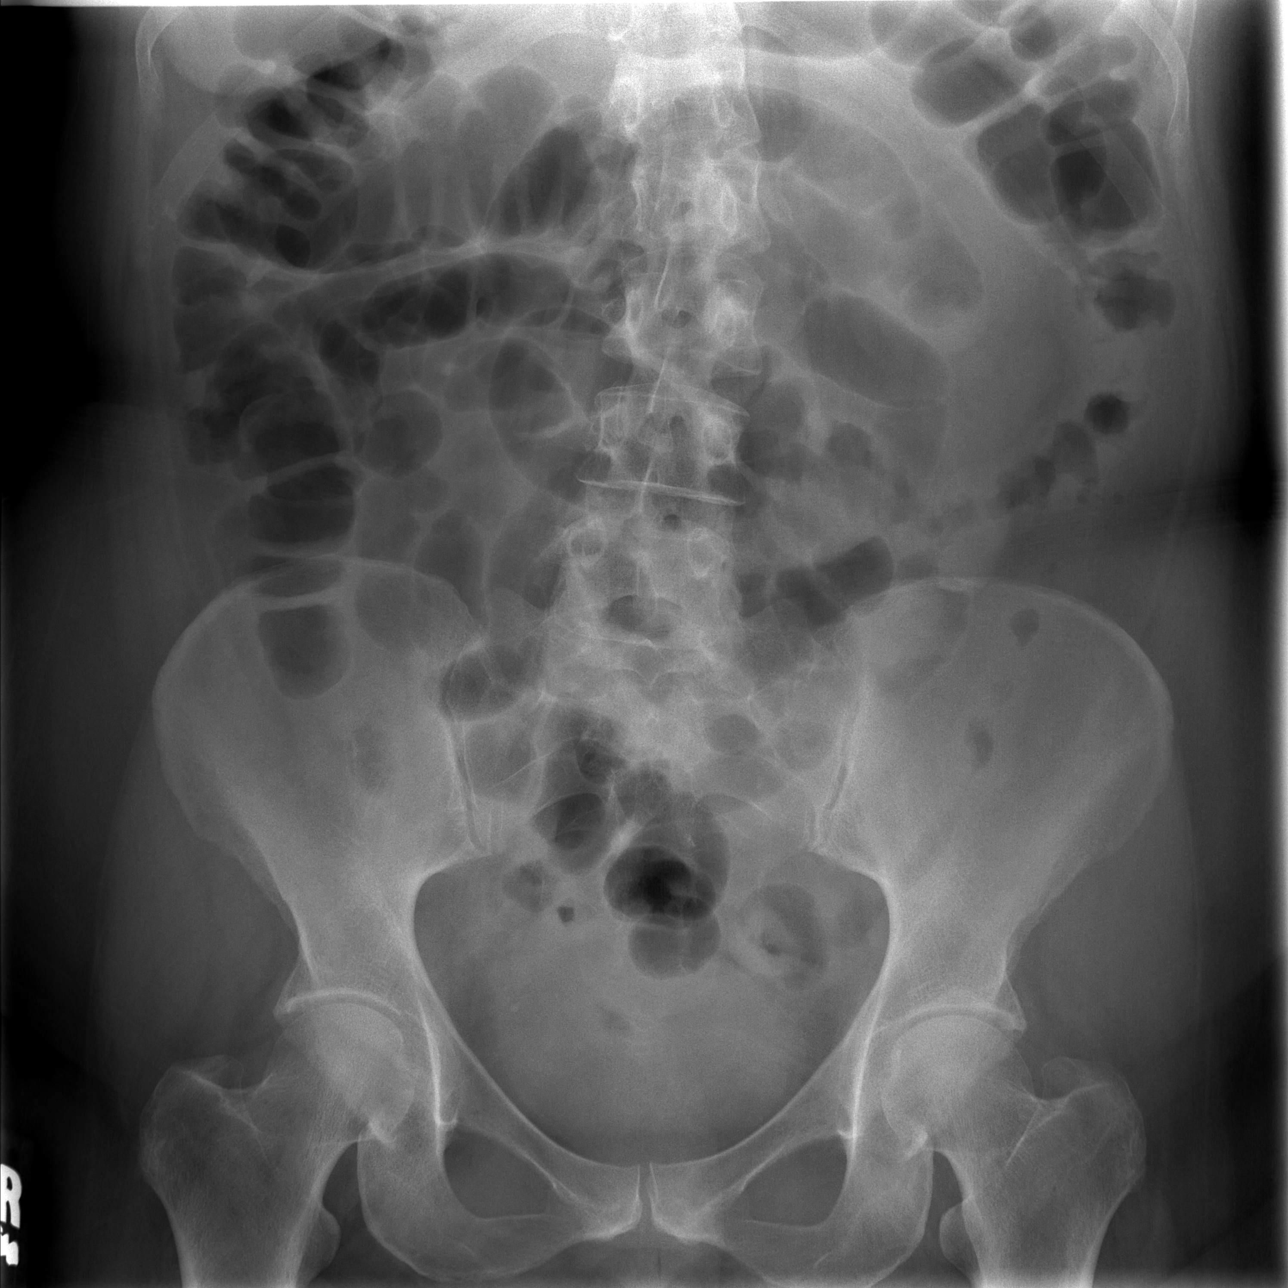

[3 of 3 positions shown; findings below may reference images not displayed]

03/21/2006 acute abdomen series

ABDOMEN SERIES - 2 VIEW & CHEST - 1 VIEW:

Upright chest shows clear lungs bilaterally.  The cardiopericardial silhouette
is within normal limits for size.

No evidence for intraperitoneal free air. Mild gaseous distention of small bowel
and colon is noted with scattered large and small bowel air-fluid levels on the
upright film. Colonic air is seen distally to the level of the sigmoid colon.

Convex leftward scoliosis of the lumbar spine noted. Surgical clips in the right
upper quadrant suggest prior cholecystectomy.
IMPRESSION: No acute cardiopulmonary process.

Bowel gas pattern most suggestive of gastroenteritis or ileus.

## 2008-03-14 ENCOUNTER — Emergency Department: Payer: Self-pay | Admitting: Emergency Medicine

## 2008-03-14 ENCOUNTER — Other Ambulatory Visit: Payer: Self-pay

## 2008-03-22 ENCOUNTER — Ambulatory Visit: Payer: Self-pay | Admitting: Pain Medicine

## 2008-04-08 IMAGING — CR DG ABDOMEN ACUTE W/ 1V CHEST
4 series · 4 of 4 positions shown · non-contrast
Comparison: 04/17/2006

CLINICAL DATA: Abdominal pain, nausea, vomiting

ABDOMEN SERIES - 2 VIEW & CHEST - 1 VIEW

[w chest pa]
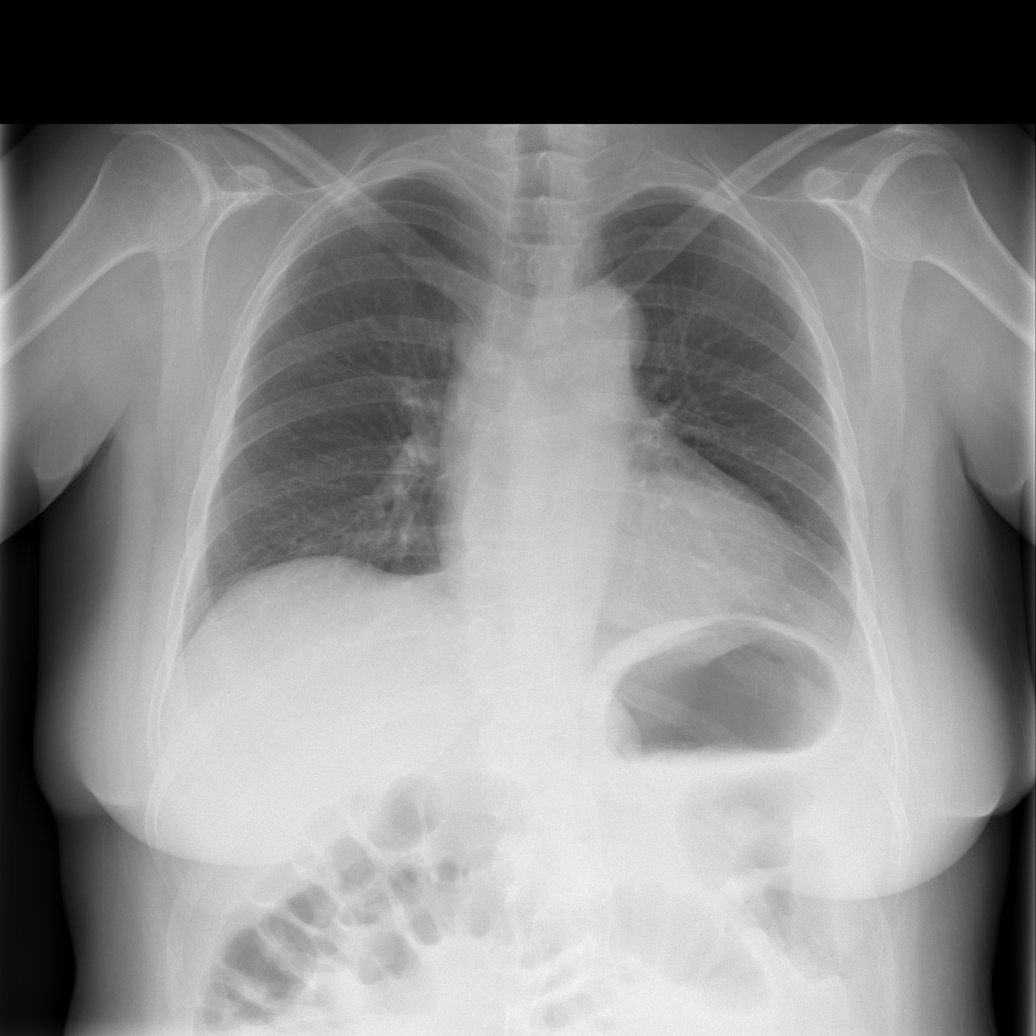

[w abdomen upright *]
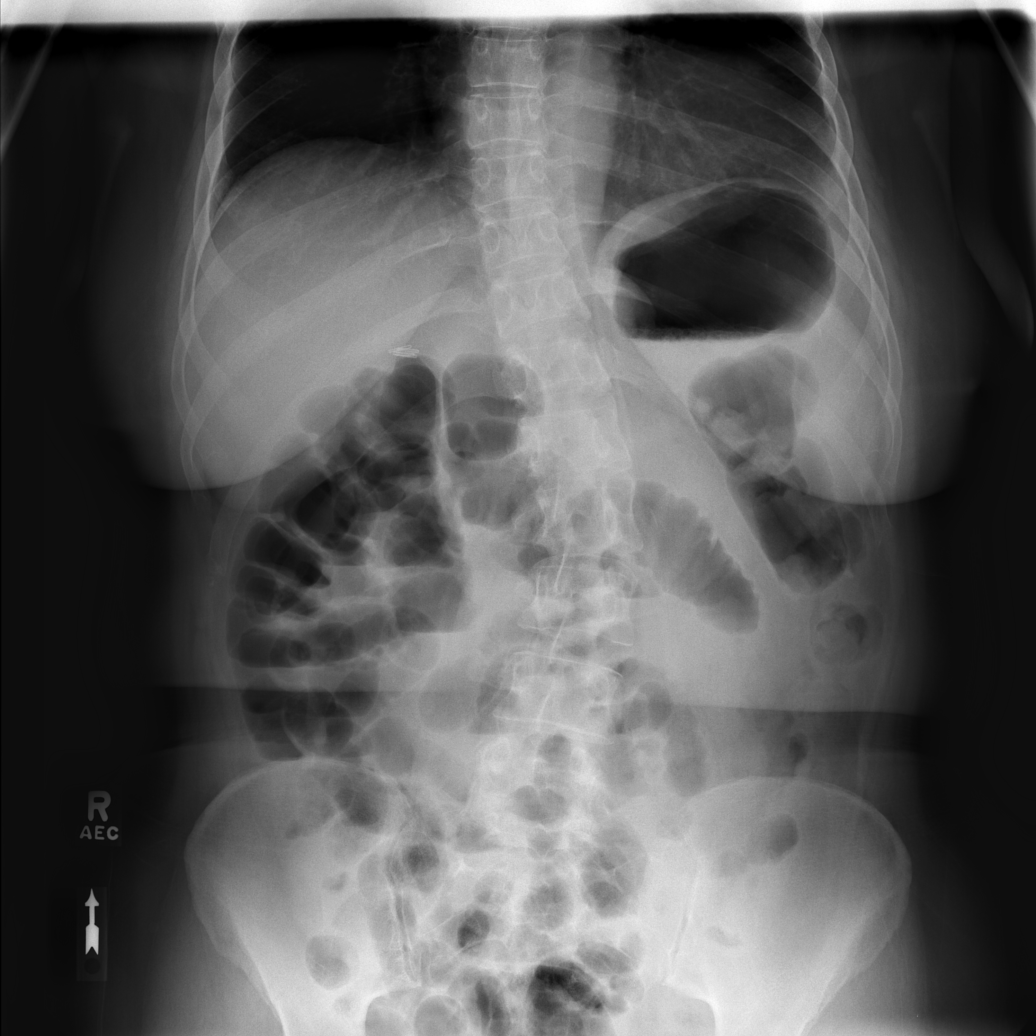

[t abdomen supine (1 of 2)]
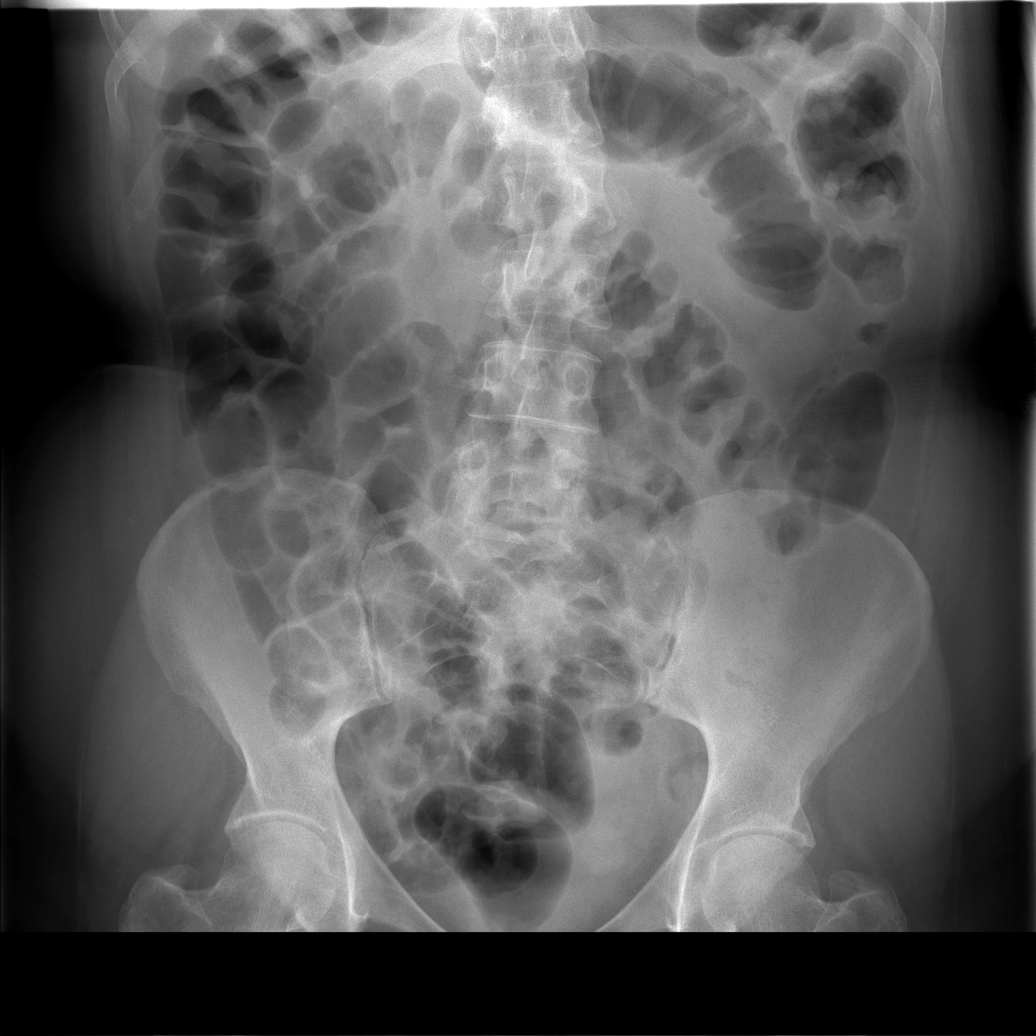

[t abdomen supine (2 of 2)]
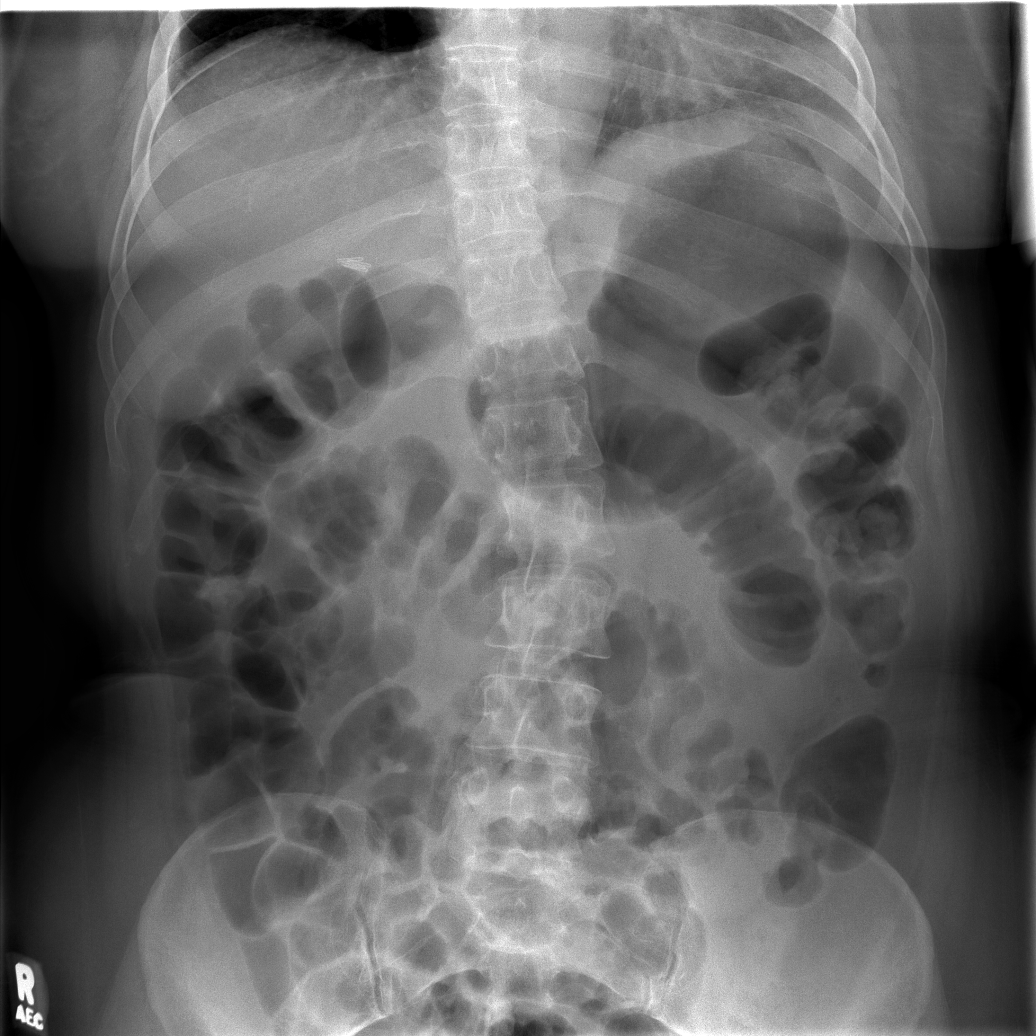

[4 of 4 positions shown; findings below may reference images not displayed]

FINDINGS: There is a nonspecific bowel gas pattern. Mild diffuse gaseous
distention of bowel noted. There is a single prominent loop of small bowel in
the left midabdomen. No free air. Patient status post cholecystectomy.

Lungs clear. Heart is normal size.

IMPRESSION

Nonspecific bowel gas pattern with mild diffuse gaseous distention of bowel and
a single mildly prominent small bowel loop in the left abdomen. No free air.

## 2008-04-09 IMAGING — CT CT ABDOMEN W/ CM
2 of 5 series · 17 of 46 positions shown, 19 images · IV contrast (omnipaque)
Comparison: 03/21/06.

CLINICAL DATA: Abdominal pain.  Nausea and vomiting.  Diarrhea.  Elevated white count.
 ABDOMEN CT WITH CONTRAST:
TECHNIQUE: Multidetector CT imaging of the abdomen was performed following the standard protocol during bolus administration of intravenous contrast.
 Contrast:  125 cc Omnipaque 300 and oral contrast.
TECHNIQUE: Multidetector CT imaging of the pelvis was performed following the standard protocol during bolus administration of intravenous contrast.

[Series 2: abd_pel 5.0 b40f st · axial · 0.67mm/px · z∈[-535,-130]mm · 14 of 91 slices shown, 16 images]
[im 5/91  soft-tissue]
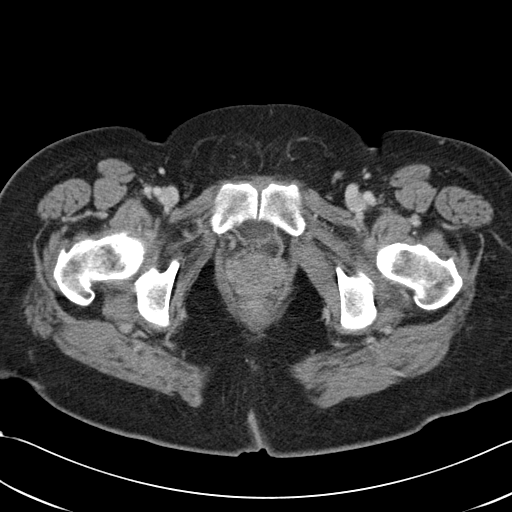
[im 5/91  bone]
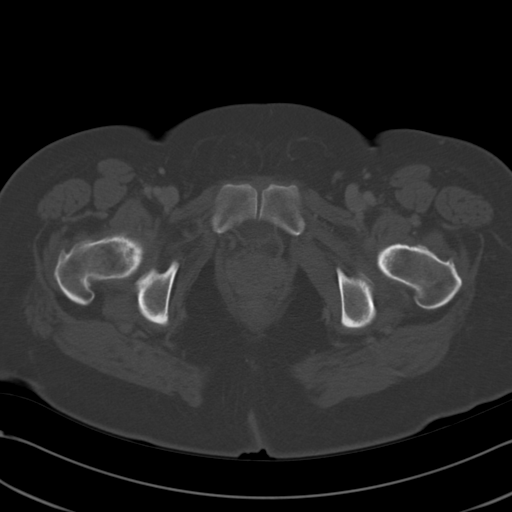
[im 14/91  soft-tissue]
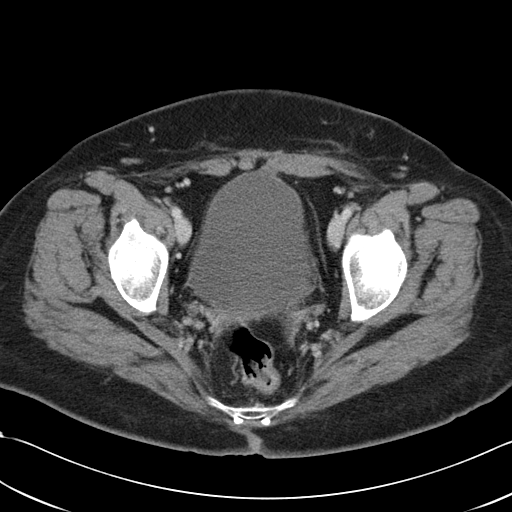
[im 19/91  soft-tissue]
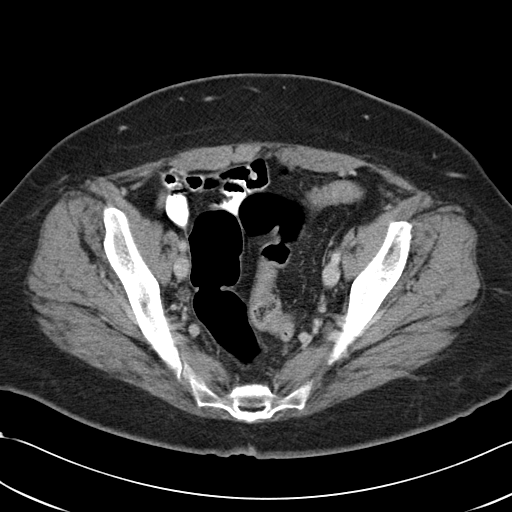
[im 23/91  soft-tissue]
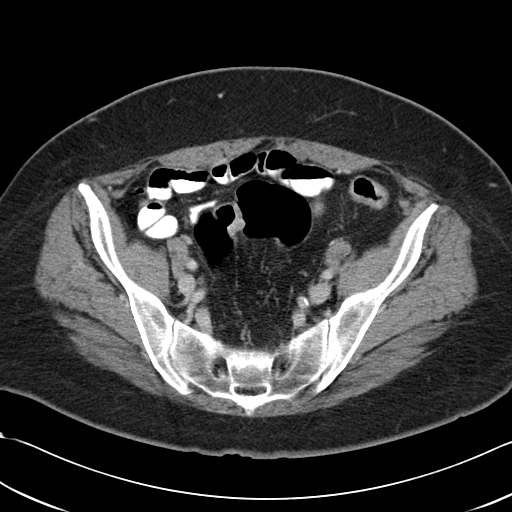
[im 32/91  soft-tissue]
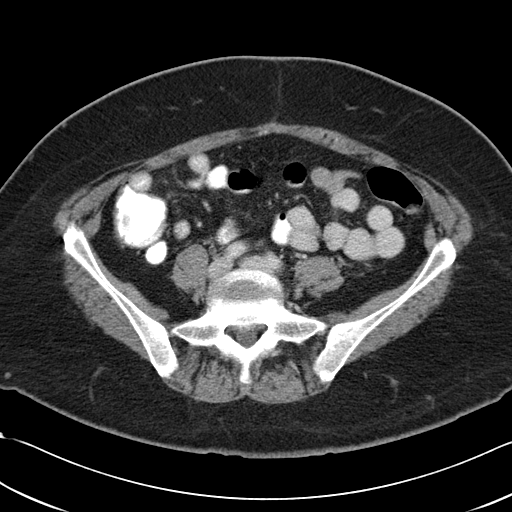
[im 37/91  soft-tissue]
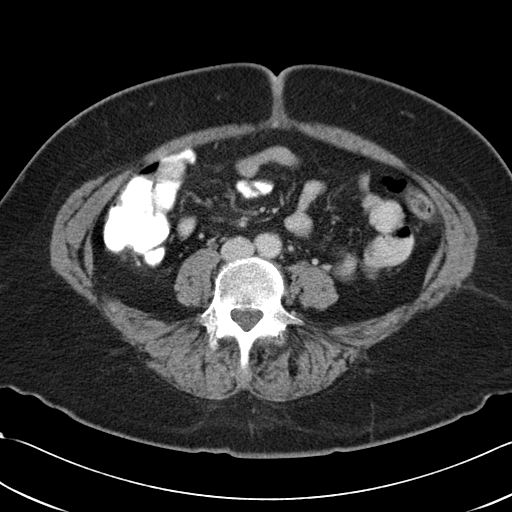
[im 41/91  soft-tissue]
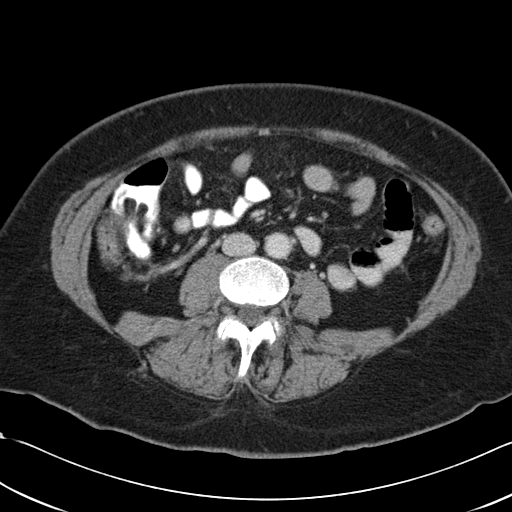
[im 50/91  soft-tissue]
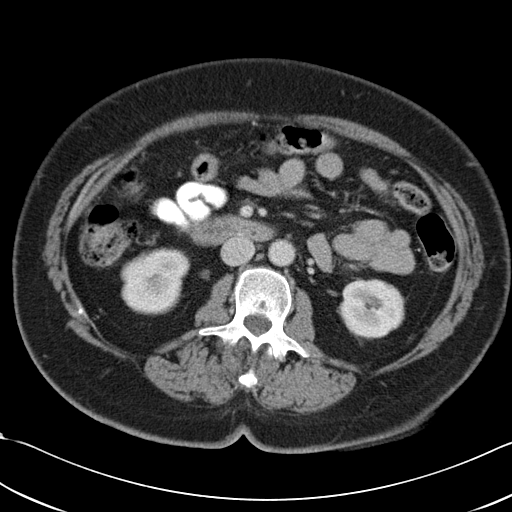
[im 55/91  soft-tissue]
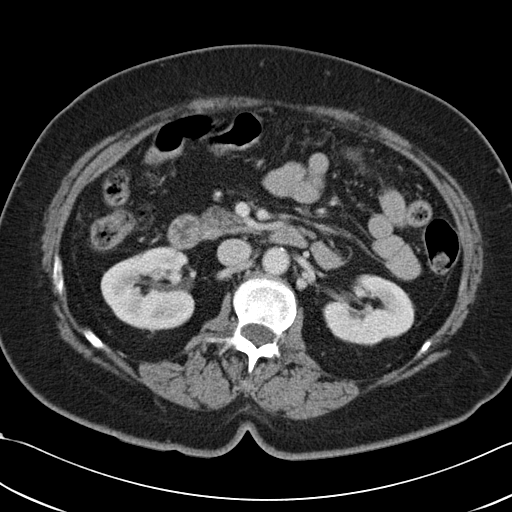
[im 55/91  bone]
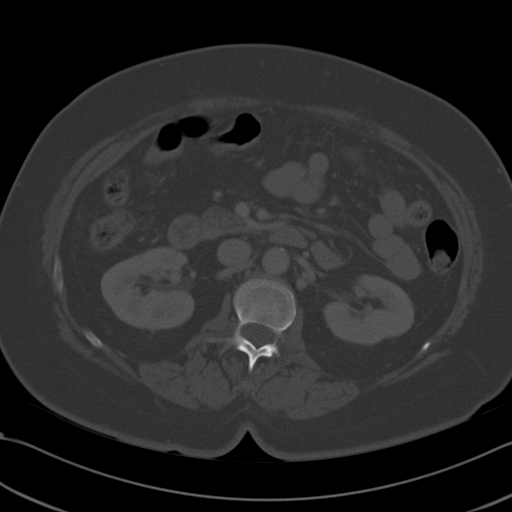
[im 59/91  soft-tissue]
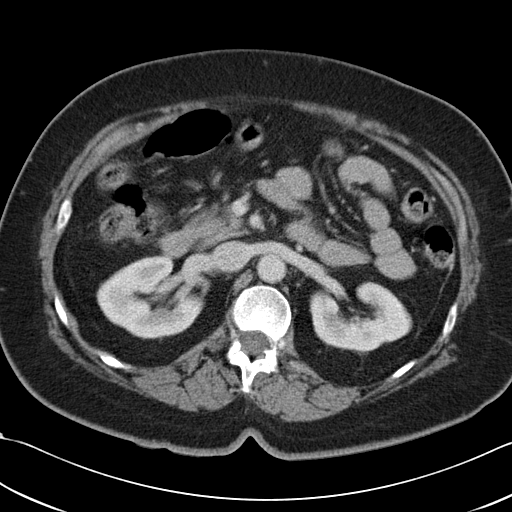
[im 68/91  soft-tissue]
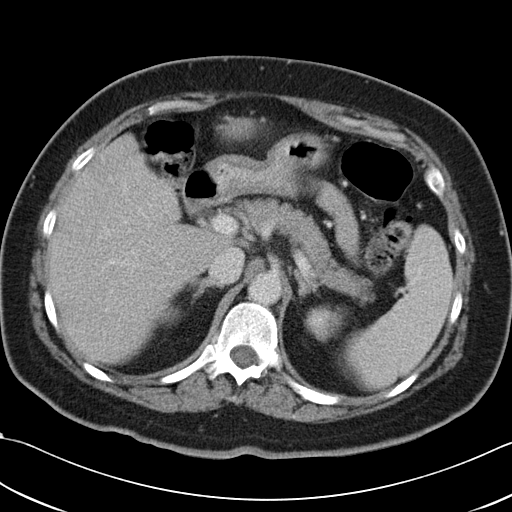
[im 73/91  soft-tissue]
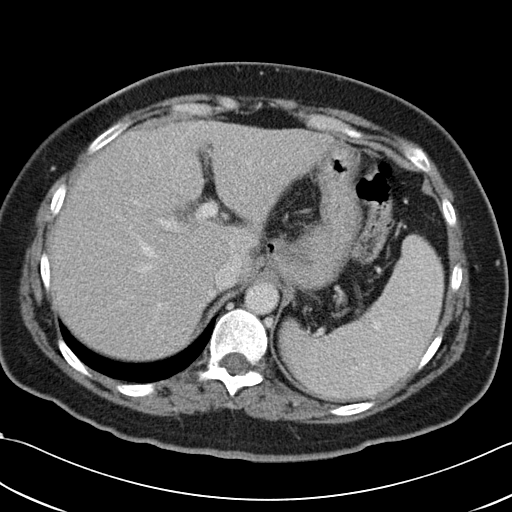
[im 77/91  soft-tissue]
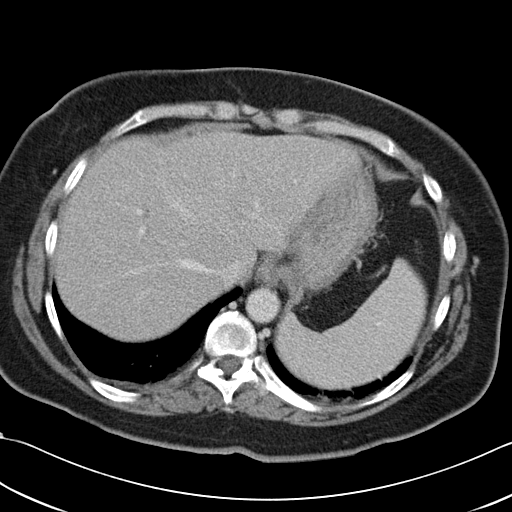
[im 86/91  soft-tissue]
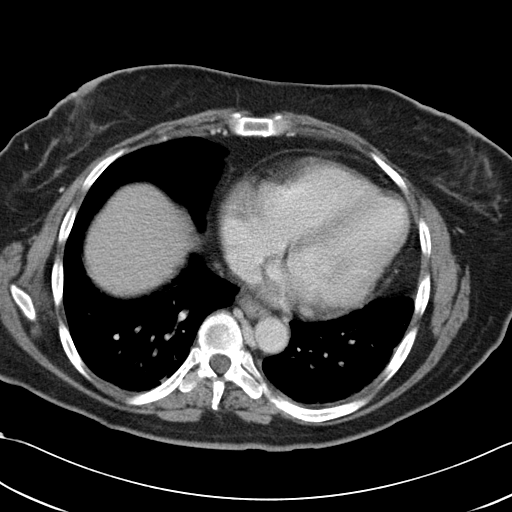

[Series 602: <mpr range> · coronal · 0.92mm/px · 3 of 101 slices shown]
[im 34/101  soft-tissue]
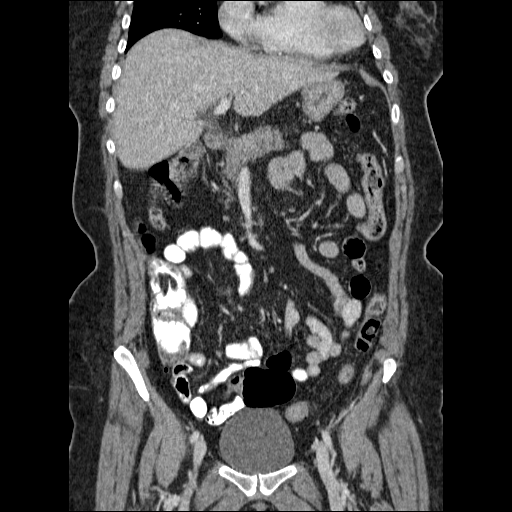
[im 45/101  soft-tissue]
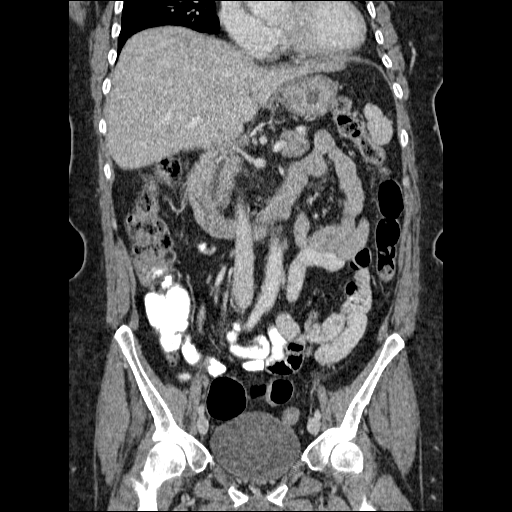
[im 56/101  soft-tissue]
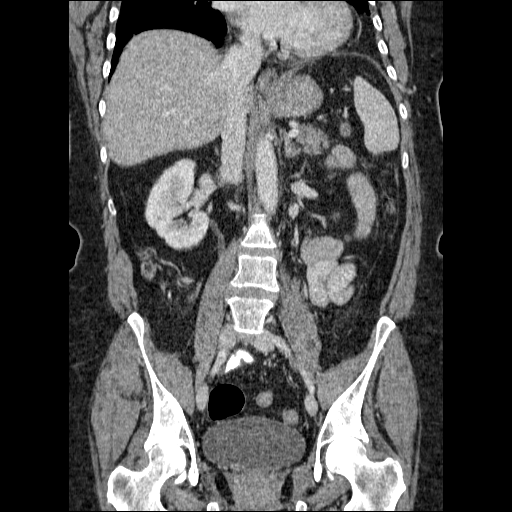

[17 of 46 positions shown; findings below may reference images not displayed]

FINDINGS: Surgical clips are seen from prior cholecystectomy.  The liver, spleen, pancreas, adrenal glands, and kidneys are normal in appearance.  There is no evidence of mass or lymphadenopathy.  There is no evidence of inflammatory process or abnormal fluid collections within the abdomen.  Abdominal bowel loops are unremarkable.
IMPRESSION: Negative abdomen CT. 
 PELVIS CT WITH CONTRAST:
FINDINGS: Mild diverticulosis of the sigmoid colon is seen; however, there is no evidence of diverticulitis.   No other inflammatory process is identified, and there is no evidence of abnormal fluid collections.   The appendix is normal in appearance.   There is no evidence of pelvic mass or adenopathy.   There is no evidence of dilated bowel loops or hernia.
IMPRESSION: 1.   No acute findings. 
 2.   Mild sigmoid diverticulosis.

## 2008-04-17 IMAGING — CR DG ABDOMEN ACUTE W/ 1V CHEST
3 series · 3 of 3 positions shown · non-contrast
Comparison: 05/16/06.

CLINICAL DATA: Nausea, vomiting and stomach pains.
 ACUTE ABDOMINAL SERIES WITH CHEST:

[w chest pa]
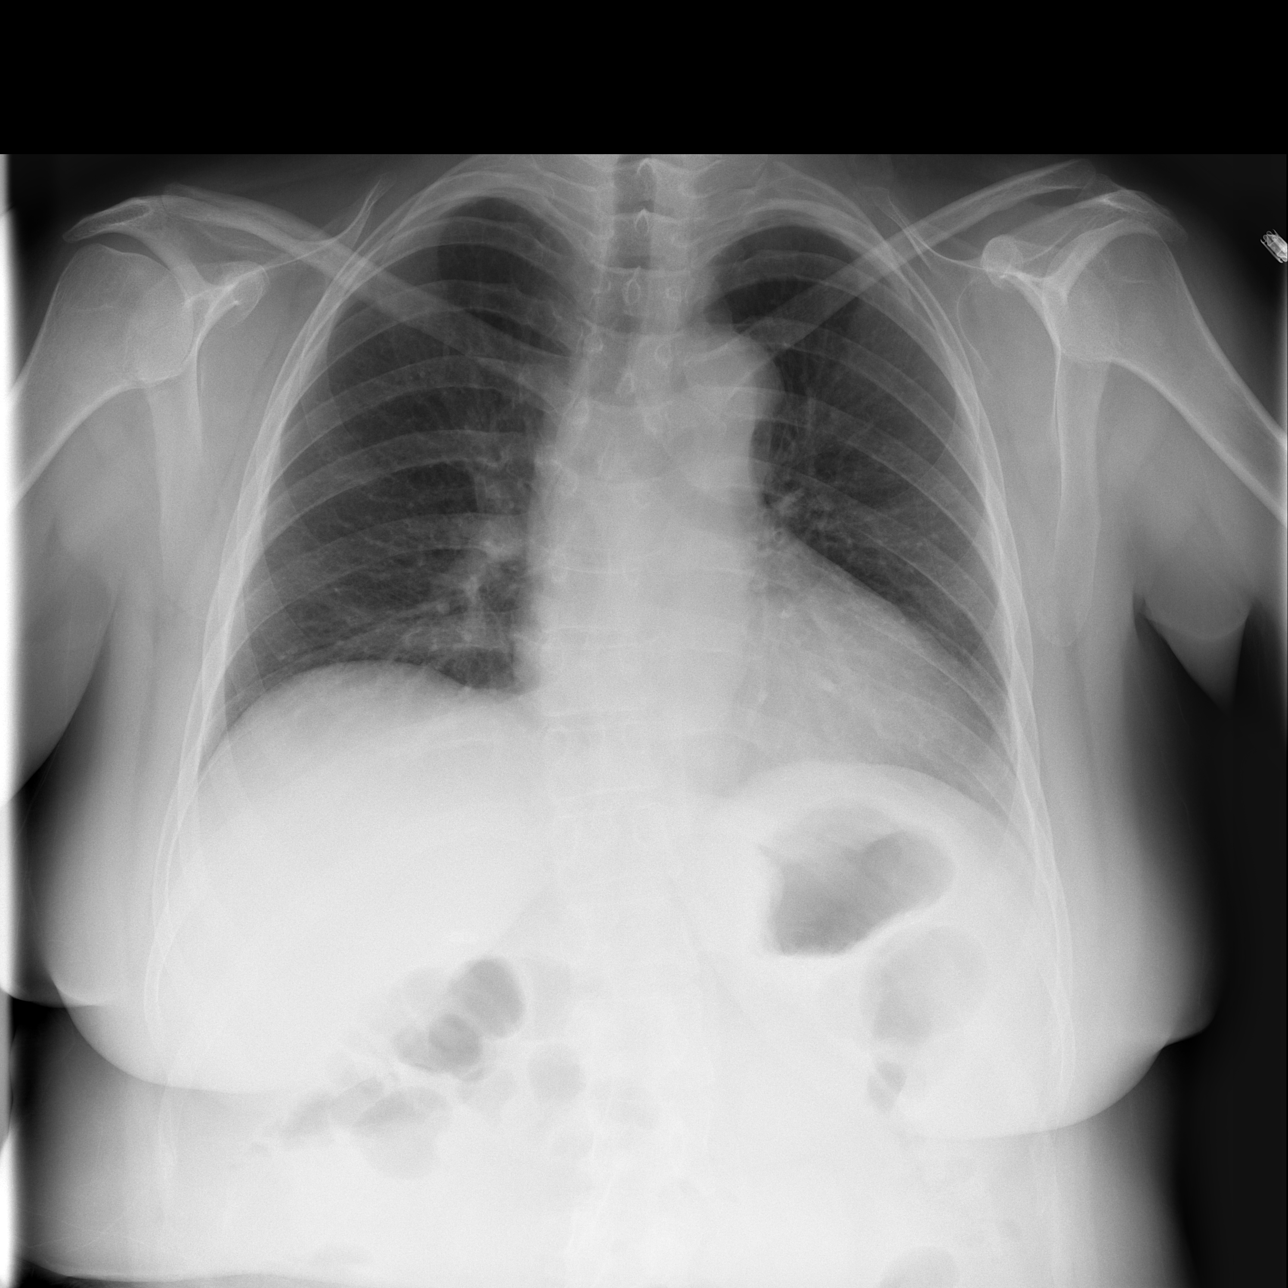

[w abdomen upright]
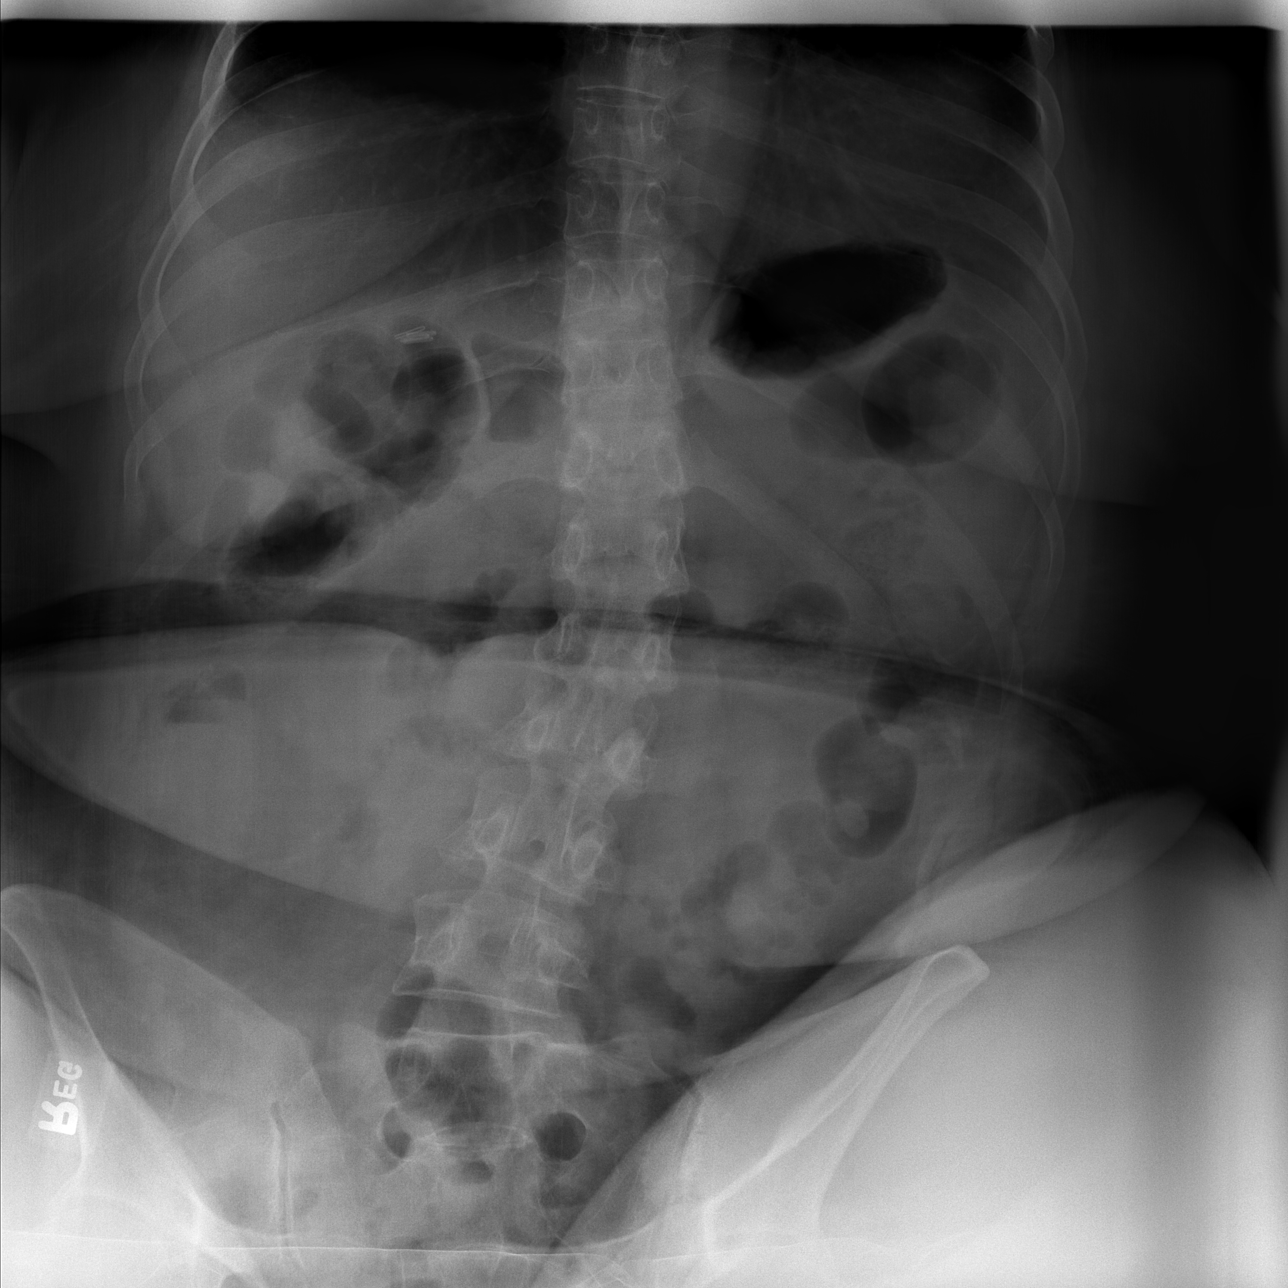

[t abdomen supine]
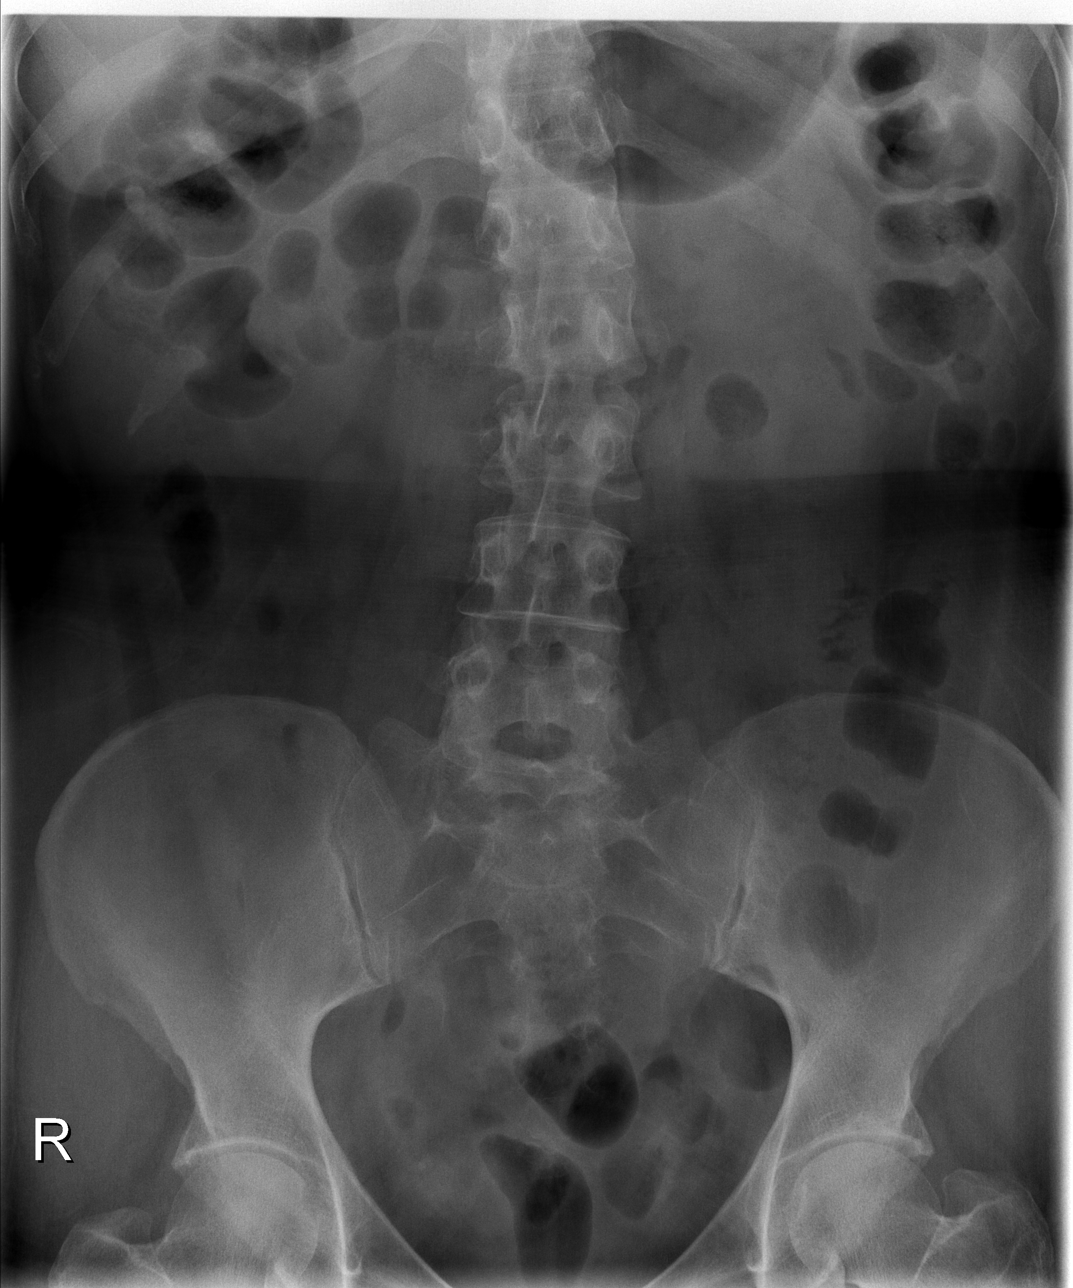

[3 of 3 positions shown; findings below may reference images not displayed]

FINDINGS: Single view of the chest demonstrates clear lungs.  Heart and mediastinum are within normal limits.  The trachea is midline.  Bone structures are intact.   No evidence for free air.   Again seen is levoscoliosis in the thoracolumbar spine and evidence of cholecystectomy clips.  The bowel gas pattern is nonspecific.  There is gas throughout the colon.  Negative for large abdominal calcifications.
IMPRESSION: 1. No acute cardiopulmonary disease. 
 2. Nonspecific bowel gas pattern.

## 2008-04-18 IMAGING — CR DG ABDOMEN 1V
2 series · 2 of 2 positions shown · non-contrast
Comparison: 05/25/06.

CLINICAL DATA: Abdominal pain for four days.  Diarrhea for three days.  Intractable nausea. 
 ABDOMEN ? 1 VIEW:

[view not recorded (1 of 2)]
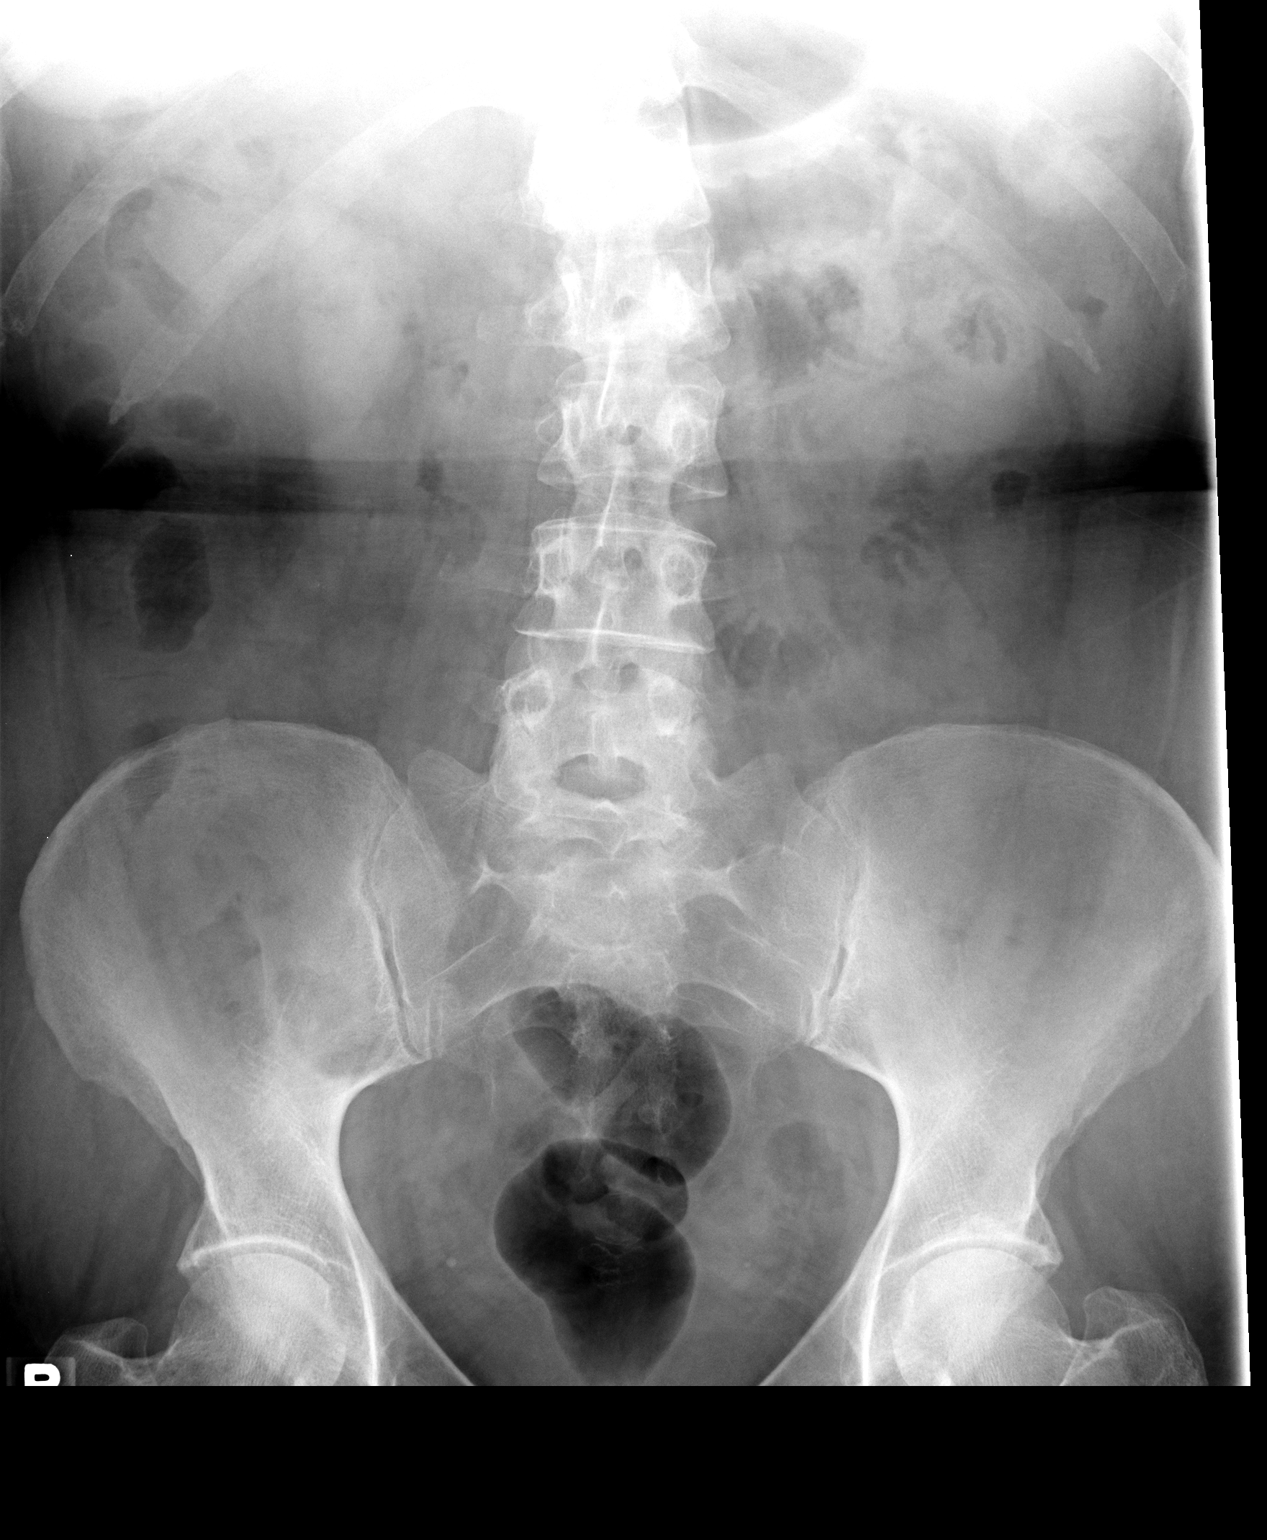

[view not recorded (2 of 2)]
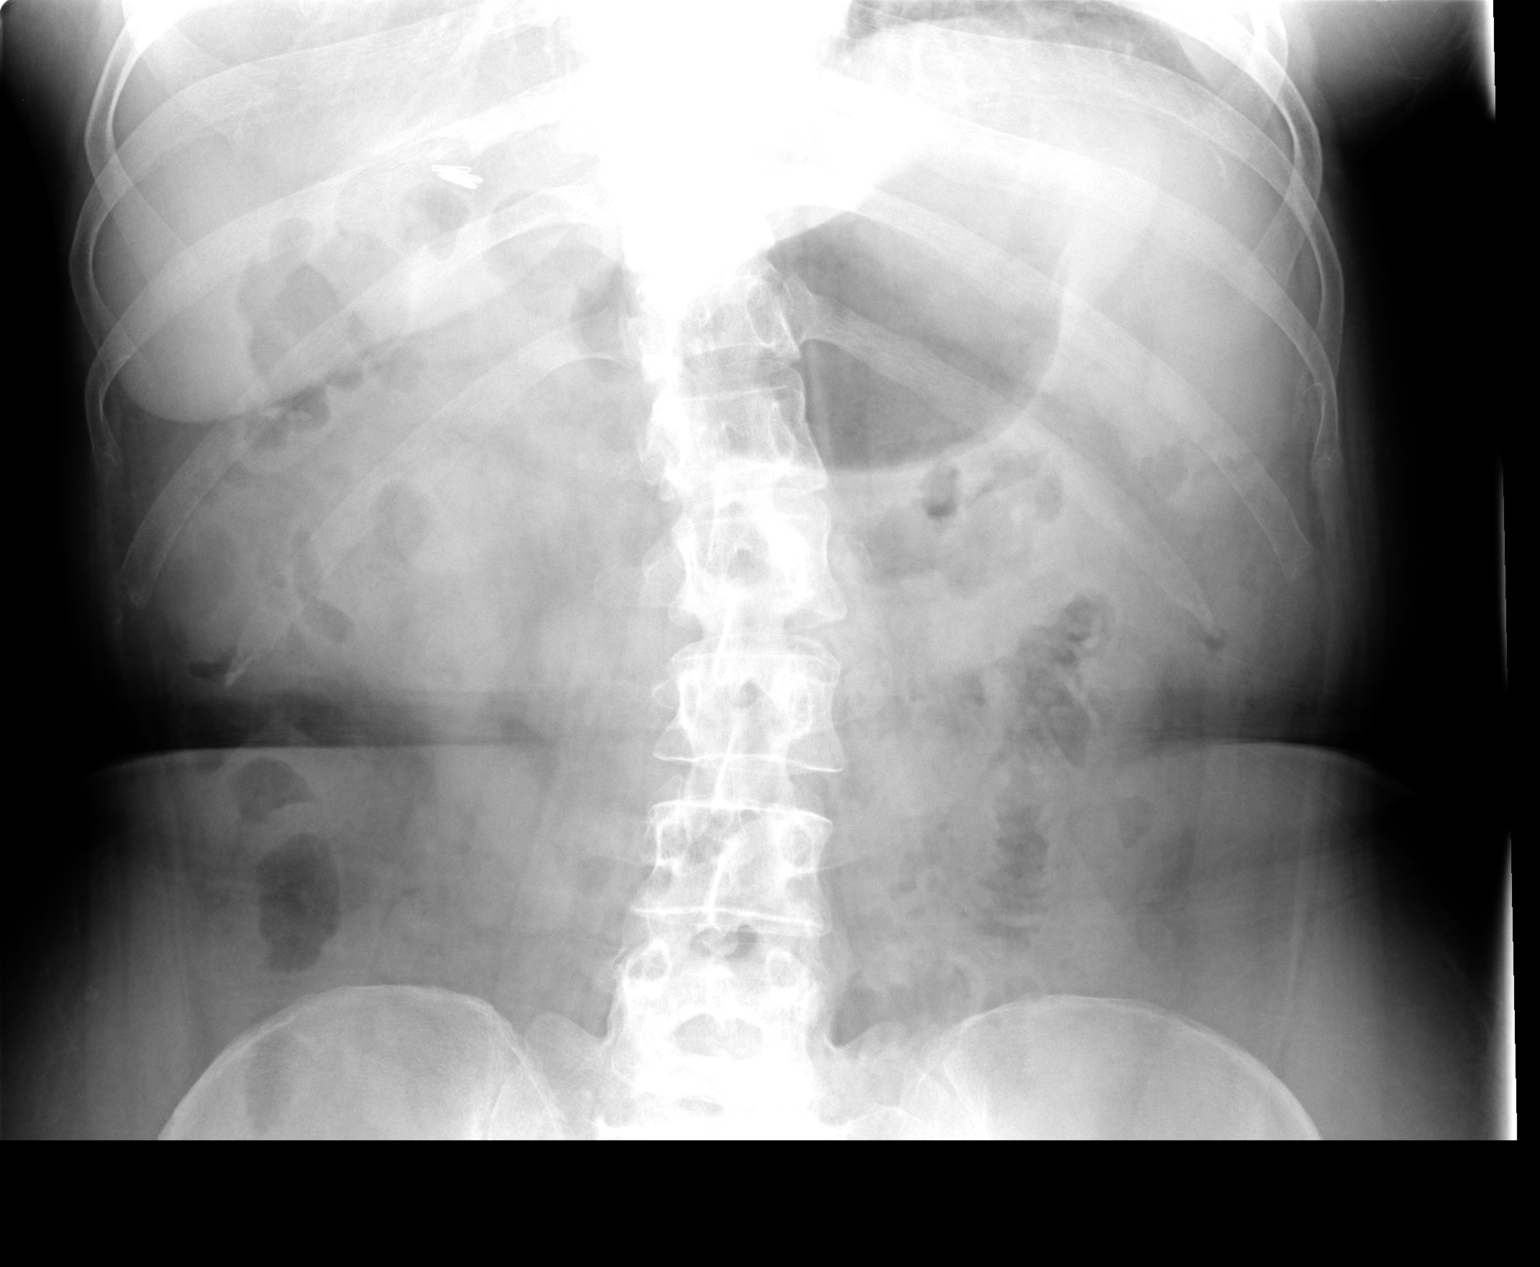

[2 of 2 positions shown; findings below may reference images not displayed]

FINDINGS: Mild convex left lumbar spinal curvature.  Degenerative changes of left hip.  No significant bowel distention.  Distal gas identified.  No abnormal calcifications.  There are loops of jejunum with apparent prominent folds.  This appearance is felt to be due to underdistention.  Cholecystectomy.   No pneumatosis or free intraperitoneal air.
IMPRESSION: No evidence of bowel obstruction.

## 2008-04-27 ENCOUNTER — Ambulatory Visit: Payer: Self-pay | Admitting: Physician Assistant

## 2008-05-13 IMAGING — CR DG ABDOMEN ACUTE W/ 1V CHEST
4 series · 4 of 4 positions shown · non-contrast
Comparison: 05/25/2006 and abdomen dated 05/26/2006.

CLINICAL DATA: Upper abdominal pain with nausea, vomiting and diarrhea for the
past 3 days. Hiatal hernia repair 10 years ago.

ABDOMEN SERIES - 2 VIEW & CHEST - 1 VIEW

[w chest pa]
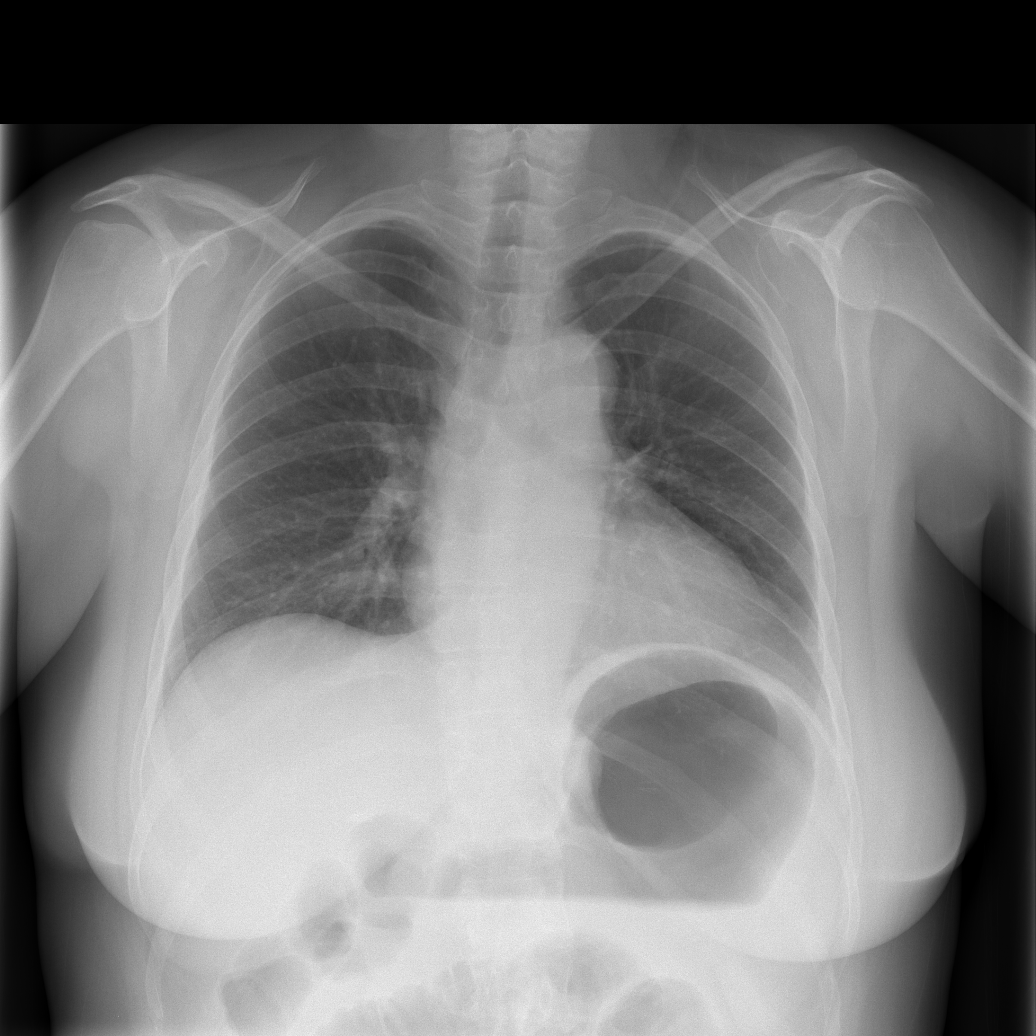

[w abdomen upright *]
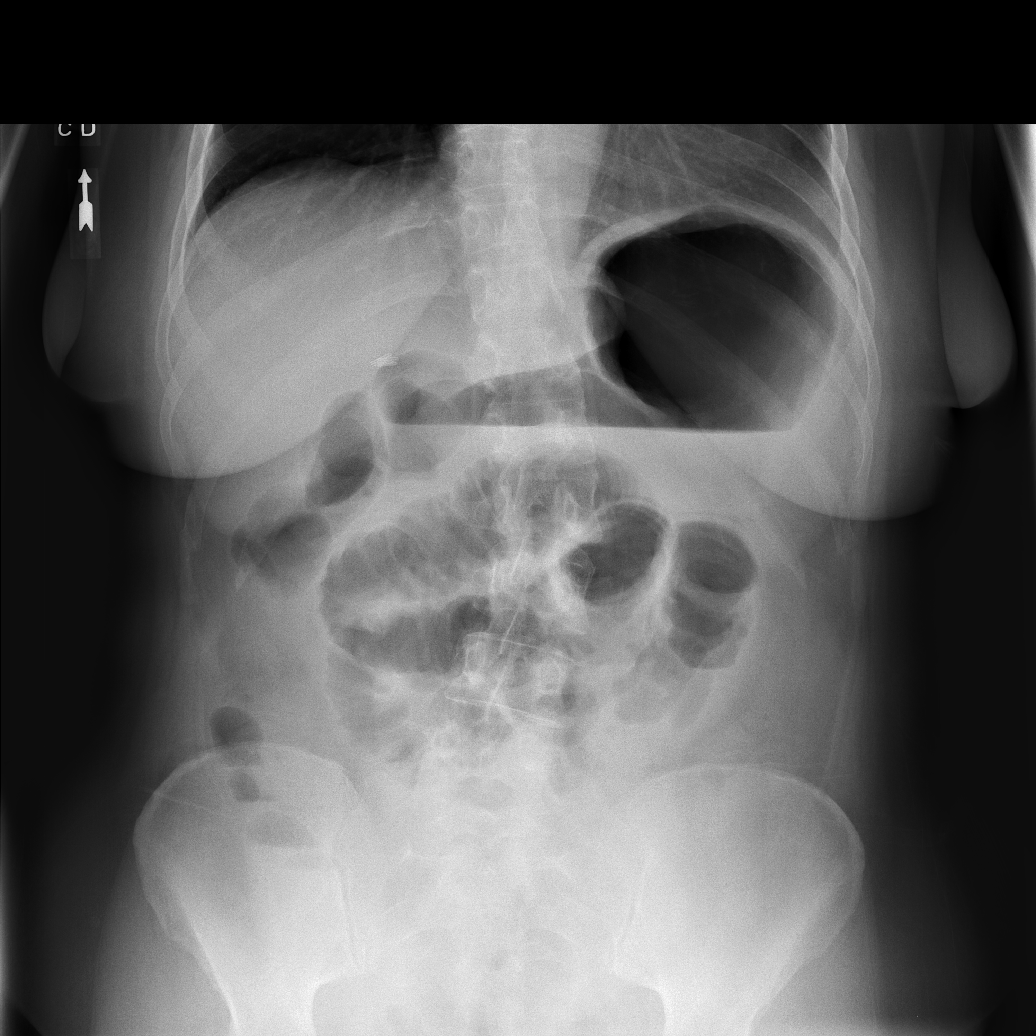

[t abdomen supine (1 of 2)]
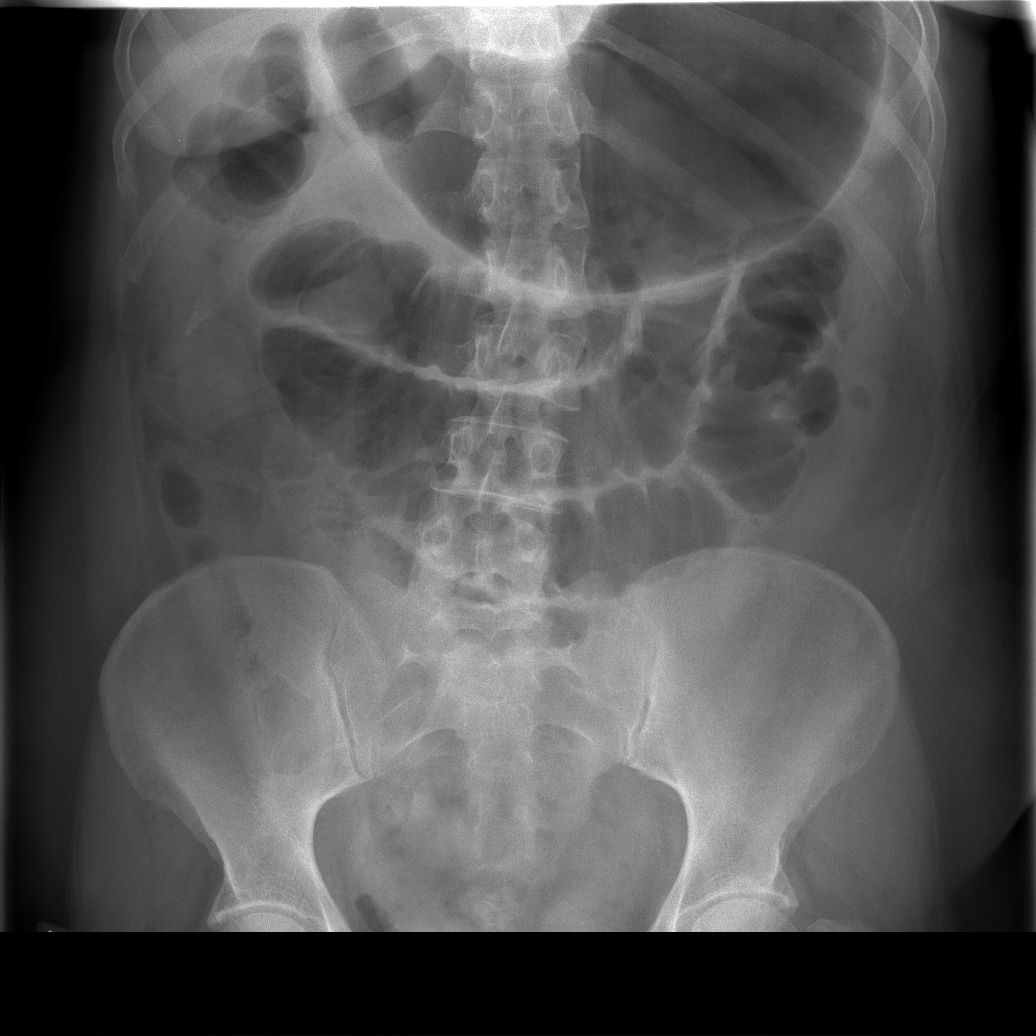

[t abdomen supine (2 of 2)]
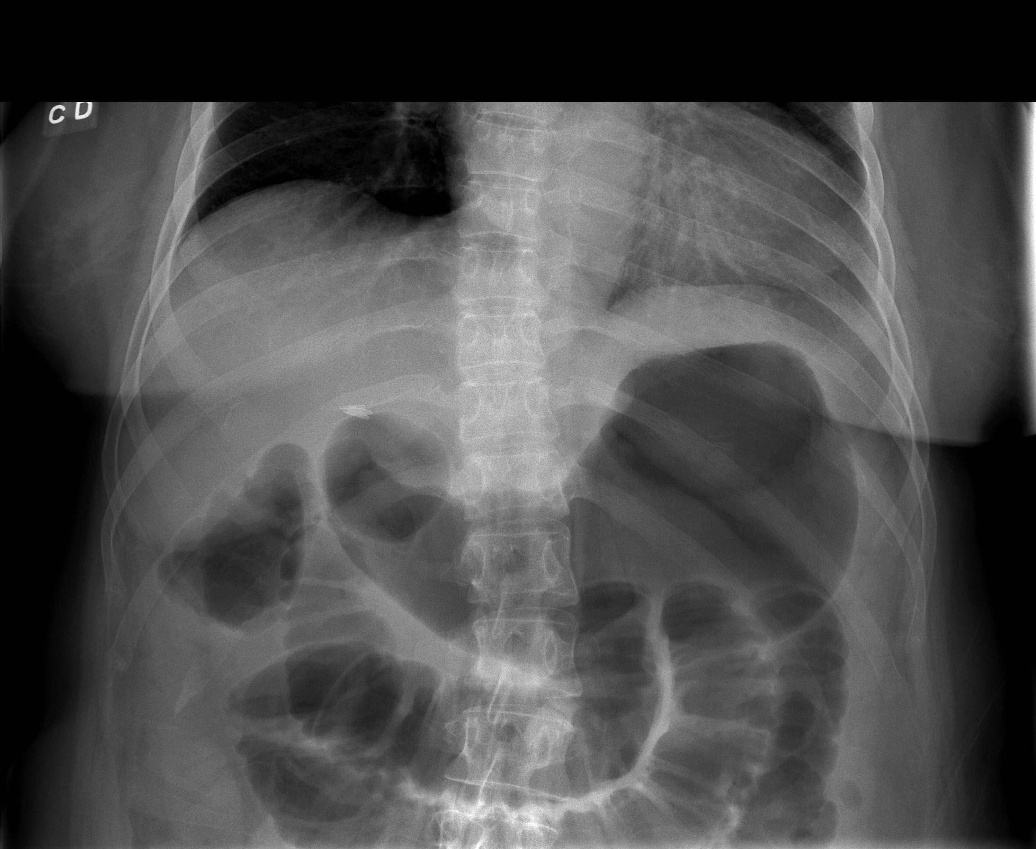

[4 of 4 positions shown; findings below may reference images not displayed]

FINDINGS: Stable normal sized heart and clear lungs. Interval dilatation of the
stomach and proximal small bowel with air-fluid levels. No free peritoneal air.
Cholecystectomy clips. Stable mild to moderate scoliosis.

IMPRESSION

Partial or early complete small bowel obstruction.

## 2008-05-13 IMAGING — CT CT PELVIS W/ CM
1 of 3 series · 14 of 32 positions shown, 19 images · IV contrast (omnipaque)
Comparison: 05/17/06.

CLINICAL DATA: Abdominal pain.  Possible small bowel obstruction.  
 ABDOMEN CT WITH CONTRAST:
TECHNIQUE: Multidetector CT imaging of the abdomen was performed following the standard protocol during bolus administration of intravenous contrast.
 Contrast:  125 cc Omnipaque 300
TECHNIQUE: Multidetector CT imaging of the pelvis was performed following the standard protocol during bolus administration of intravenous contrast.

[Series 2: abd_pel 5.0 b40f st · axial · 0.66mm/px · z∈[-437,+23]mm · 14 of 104 slices shown, 19 images]
[im 6/104  soft-tissue]
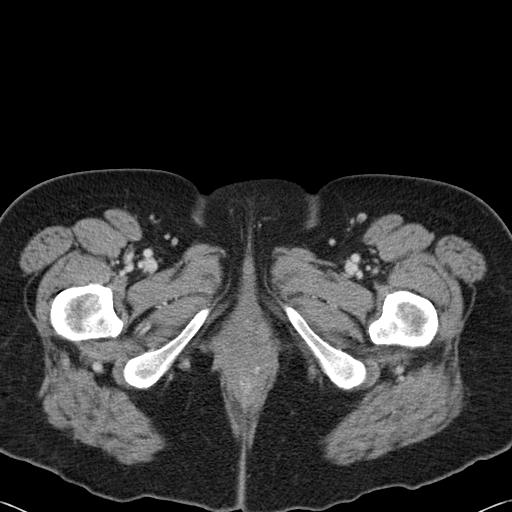
[im 6/104  bone]
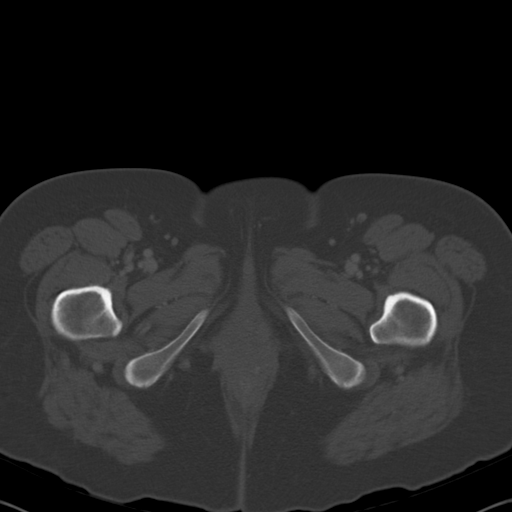
[im 17/104  soft-tissue]
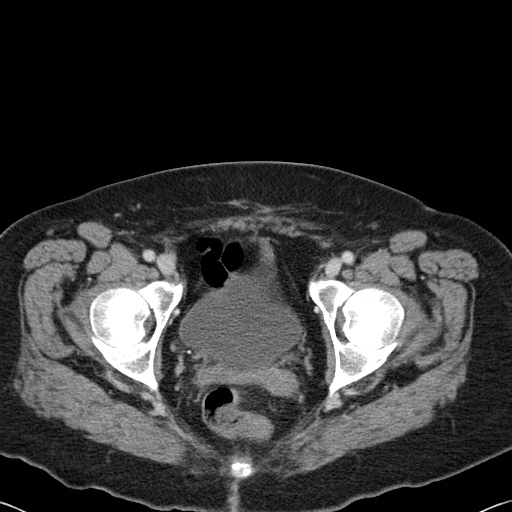
[im 22/104  soft-tissue]
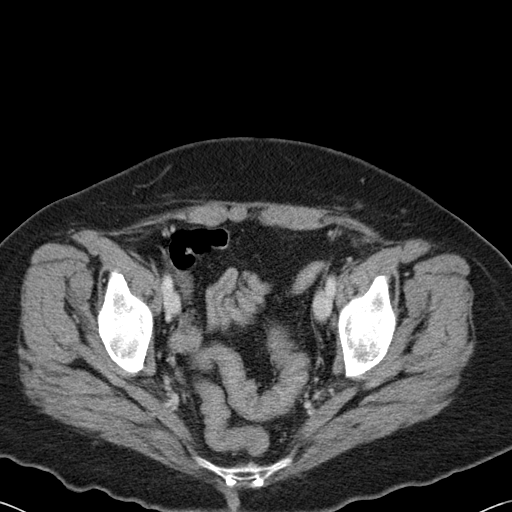
[im 28/104  soft-tissue]
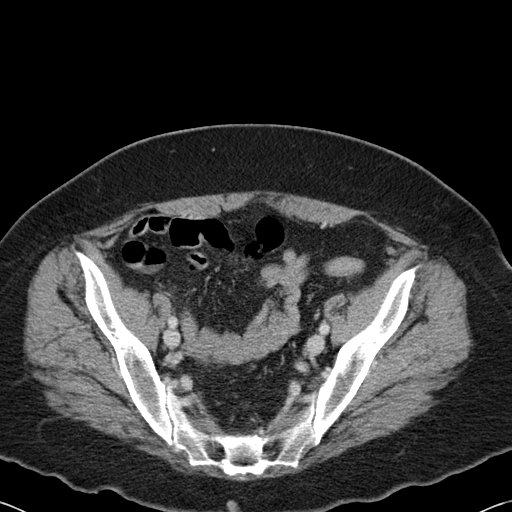
[im 38/104  soft-tissue]
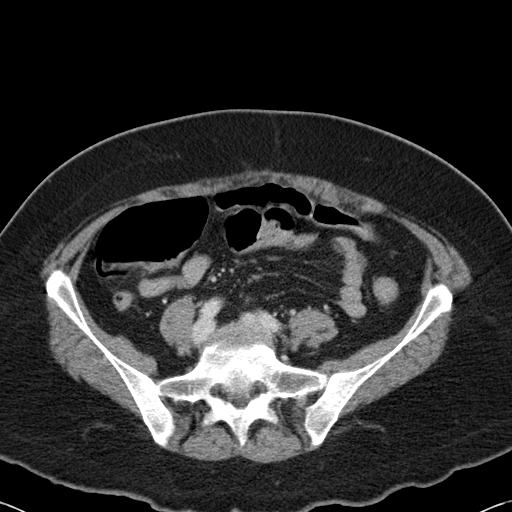
[im 44/104  soft-tissue]
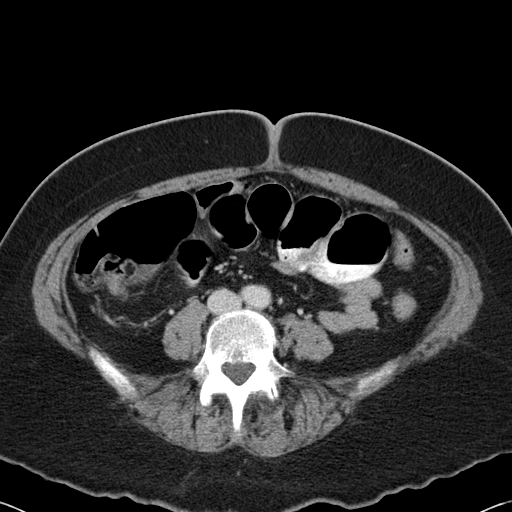
[im 55/104  soft-tissue]
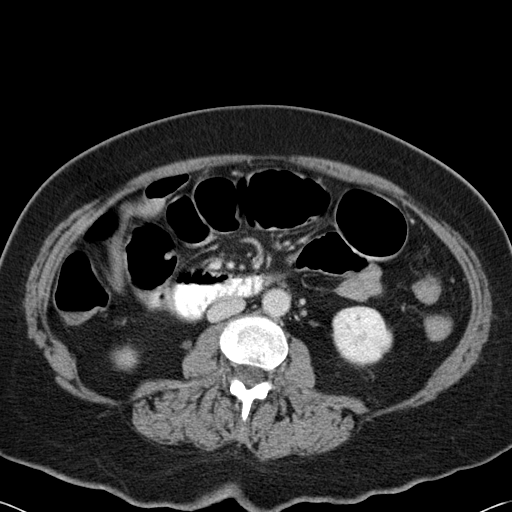
[im 60/104  soft-tissue]
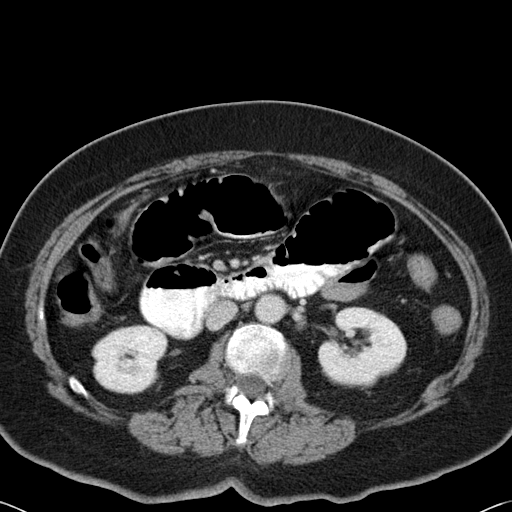
[im 66/104  soft-tissue]
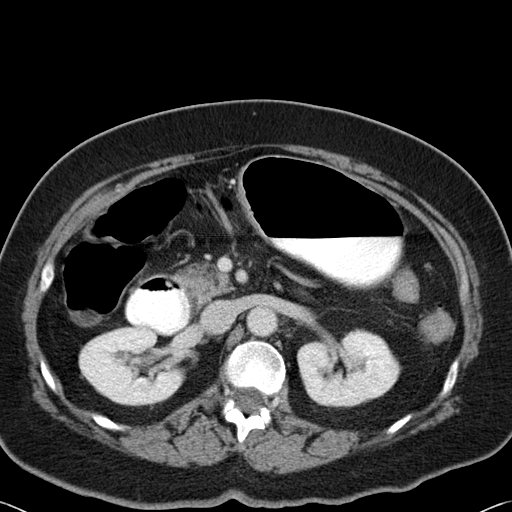
[im 66/104  bone]
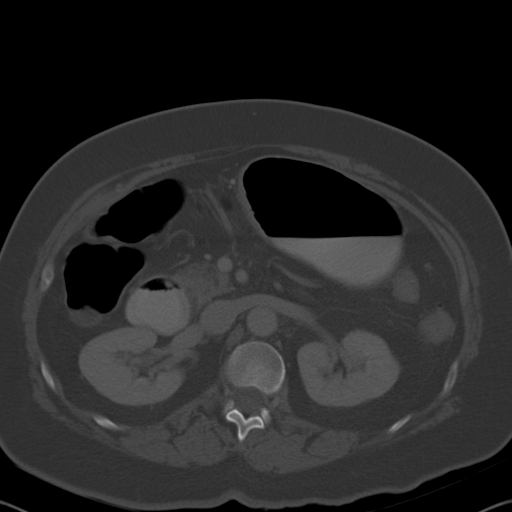
[im 76/104  soft-tissue]
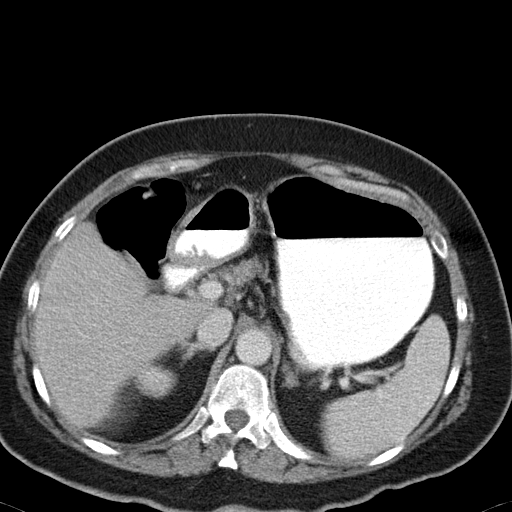
[im 82/104  soft-tissue]
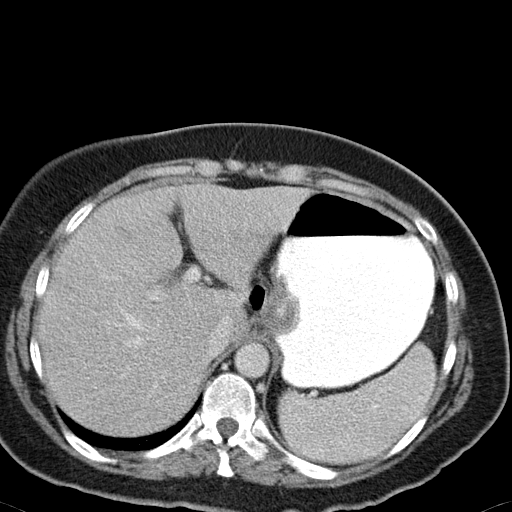
[im 82/104  lung]
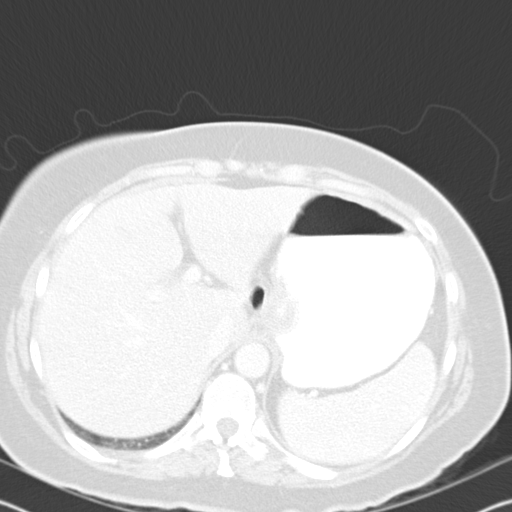
[im 87/104  soft-tissue]
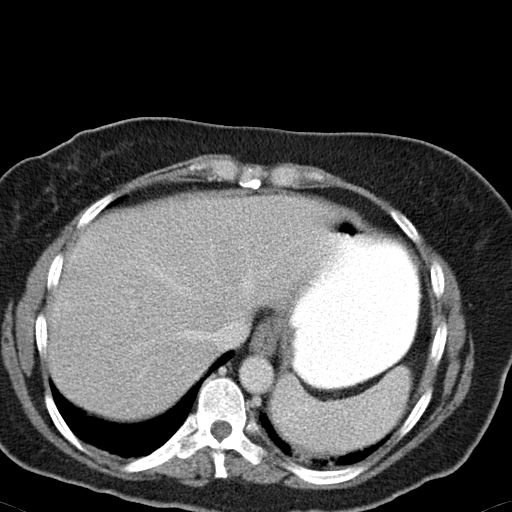
[im 87/104  lung]
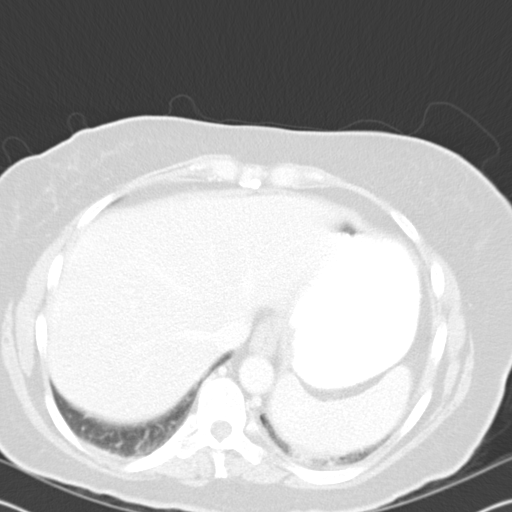
[im 93/104  lung]
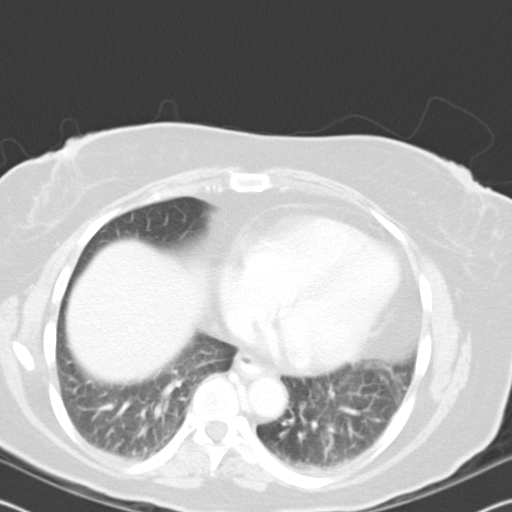
[im 98/104  soft-tissue]
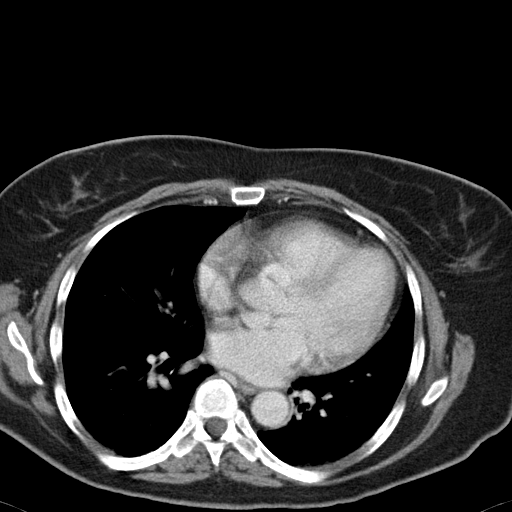
[im 98/104  lung]
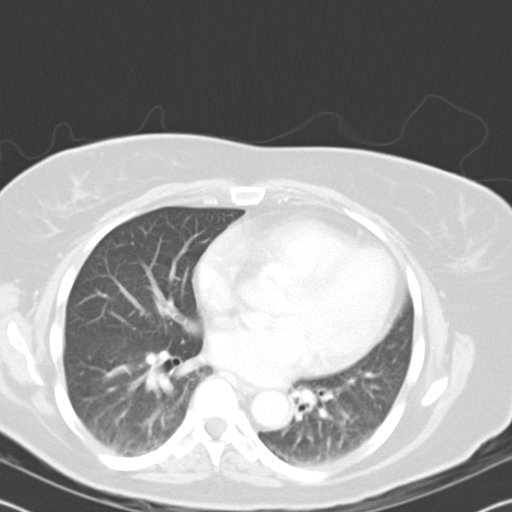

[14 of 32 positions shown; findings below may reference images not displayed]

FINDINGS: Lung bases are clear.  The liver has a normal appearance.  The patient has had cholecystectomy.  The spleen, pancreas, adrenal glands, and kidneys are unchanged and unremarkable.  There are dilated proximal small bowel loops and collapsed distal small bowel loops raising the possibility of partial small bowel obstruction.  No acute colonic pathology is seen.  No free fluid or air.
IMPRESSION: Caliber change in the mid small intestine suggesting partial small bowel obstruction in that region. 
 PELVIS CT WITH CONTRAST:
FINDINGS: No free fluid in the pelvis.  There is diverticulosis without evidence of diverticulitis.  The patient has had hysterectomy.  No evidence of mass or adenopathy.
IMPRESSION: Negative CT scan of the pelvis.

## 2008-05-19 IMAGING — CR DG SACRUM/COCCYX 2+V
3 series · 3 of 3 positions shown · non-contrast
Comparison: CT abdomen and pelvis 06/20/06.

CLINICAL DATA: Gastroparesis.  Tailbone pain.
 SACRUM AND COCCYX ? 3 VIEW ? 06/26/06:

[t sacrum a.p.]
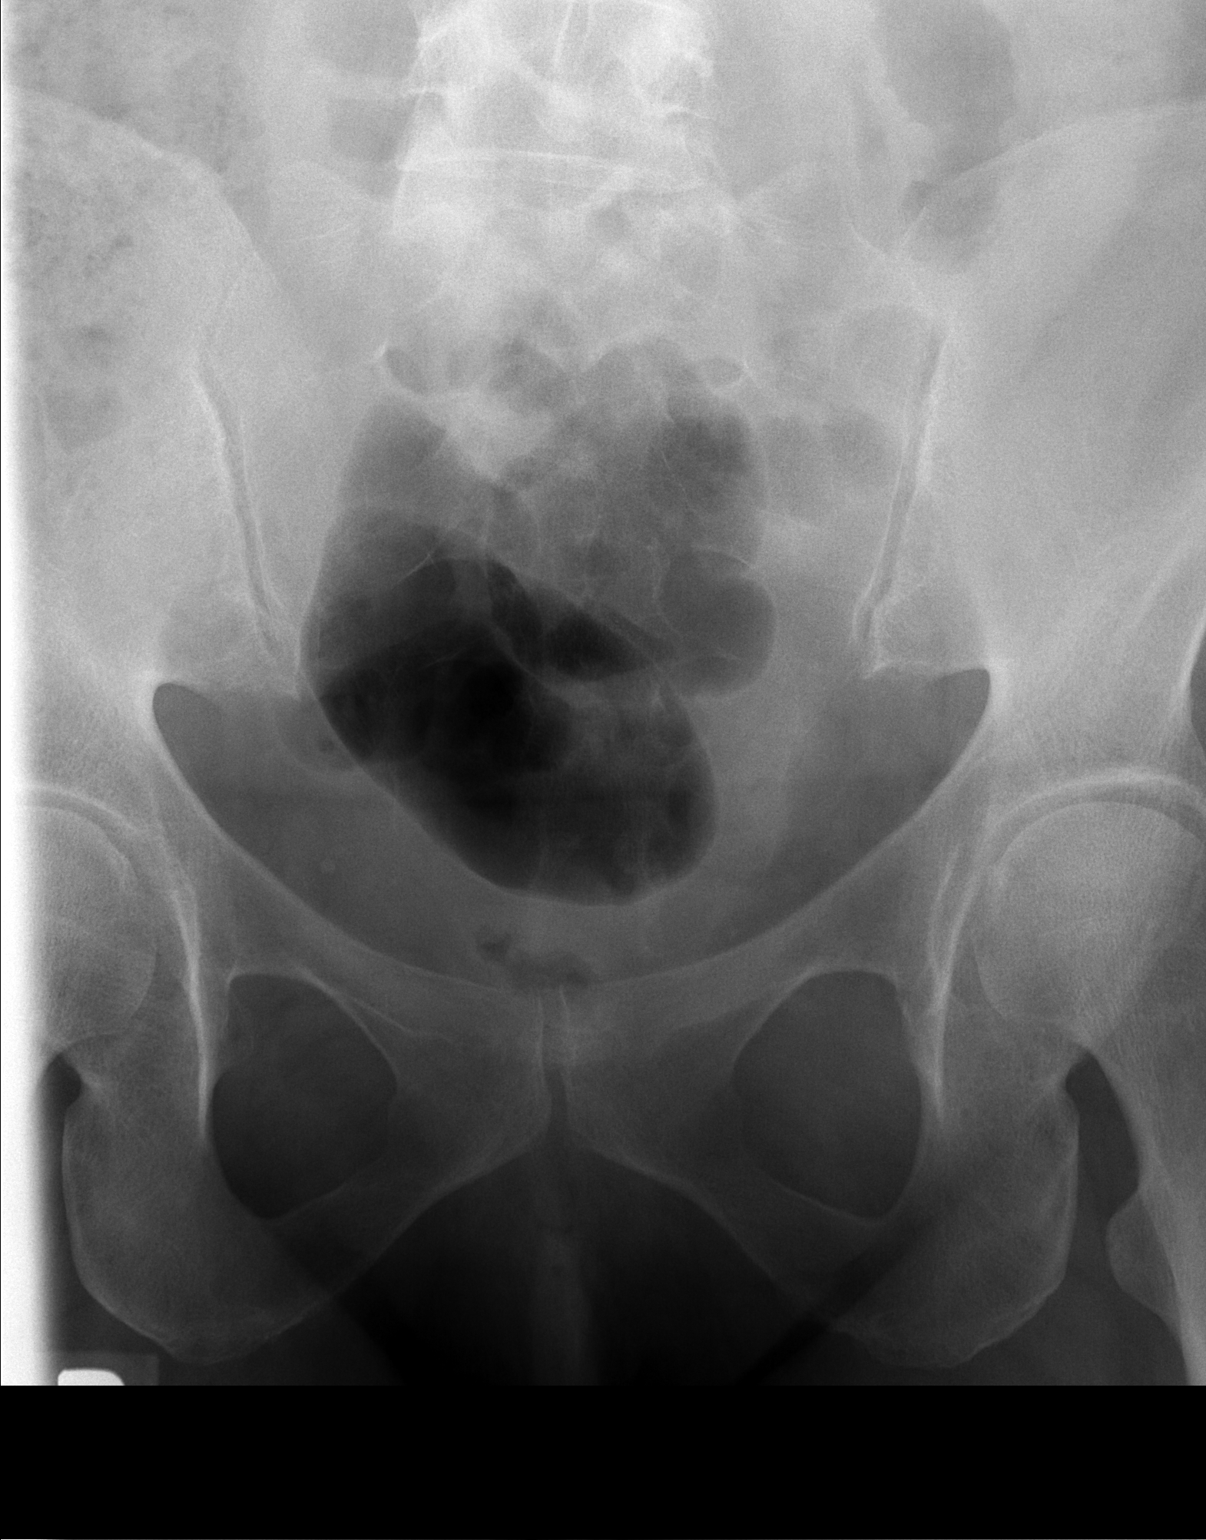

[t coccyx a.p.]
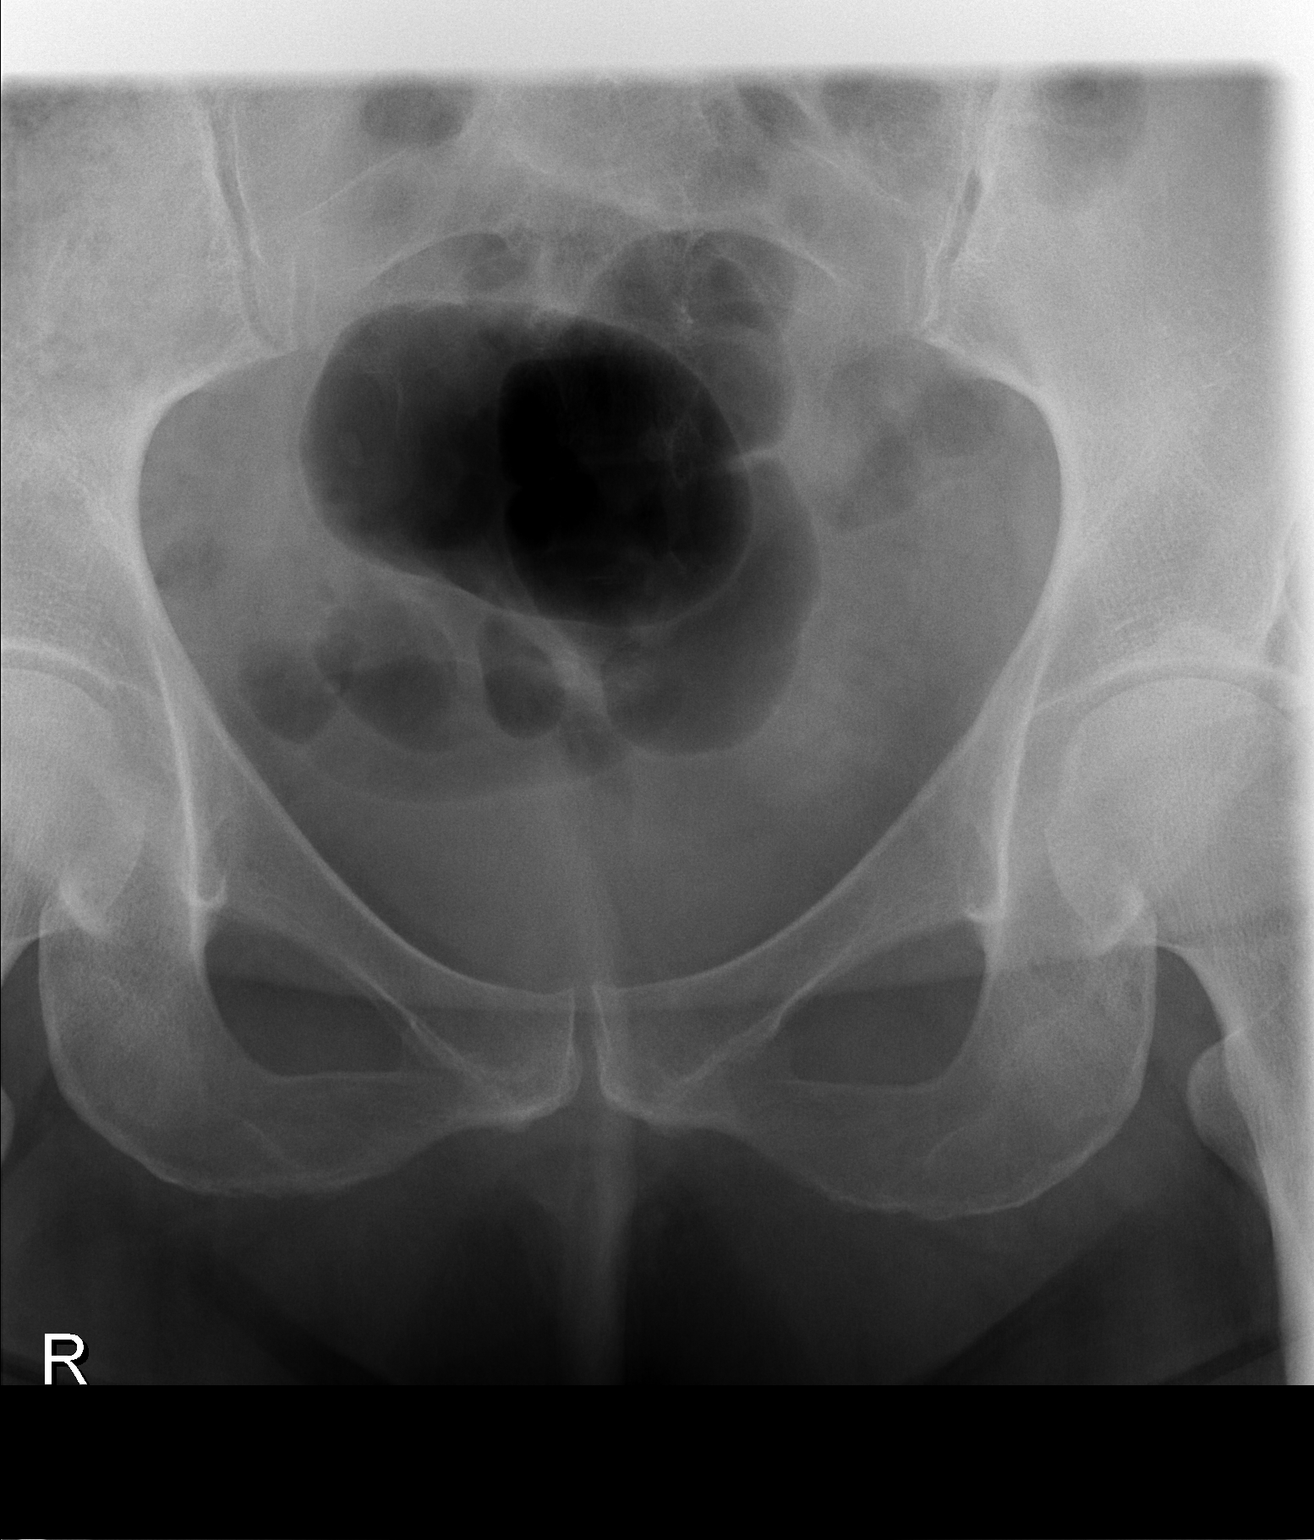

[t coccyx lat *]
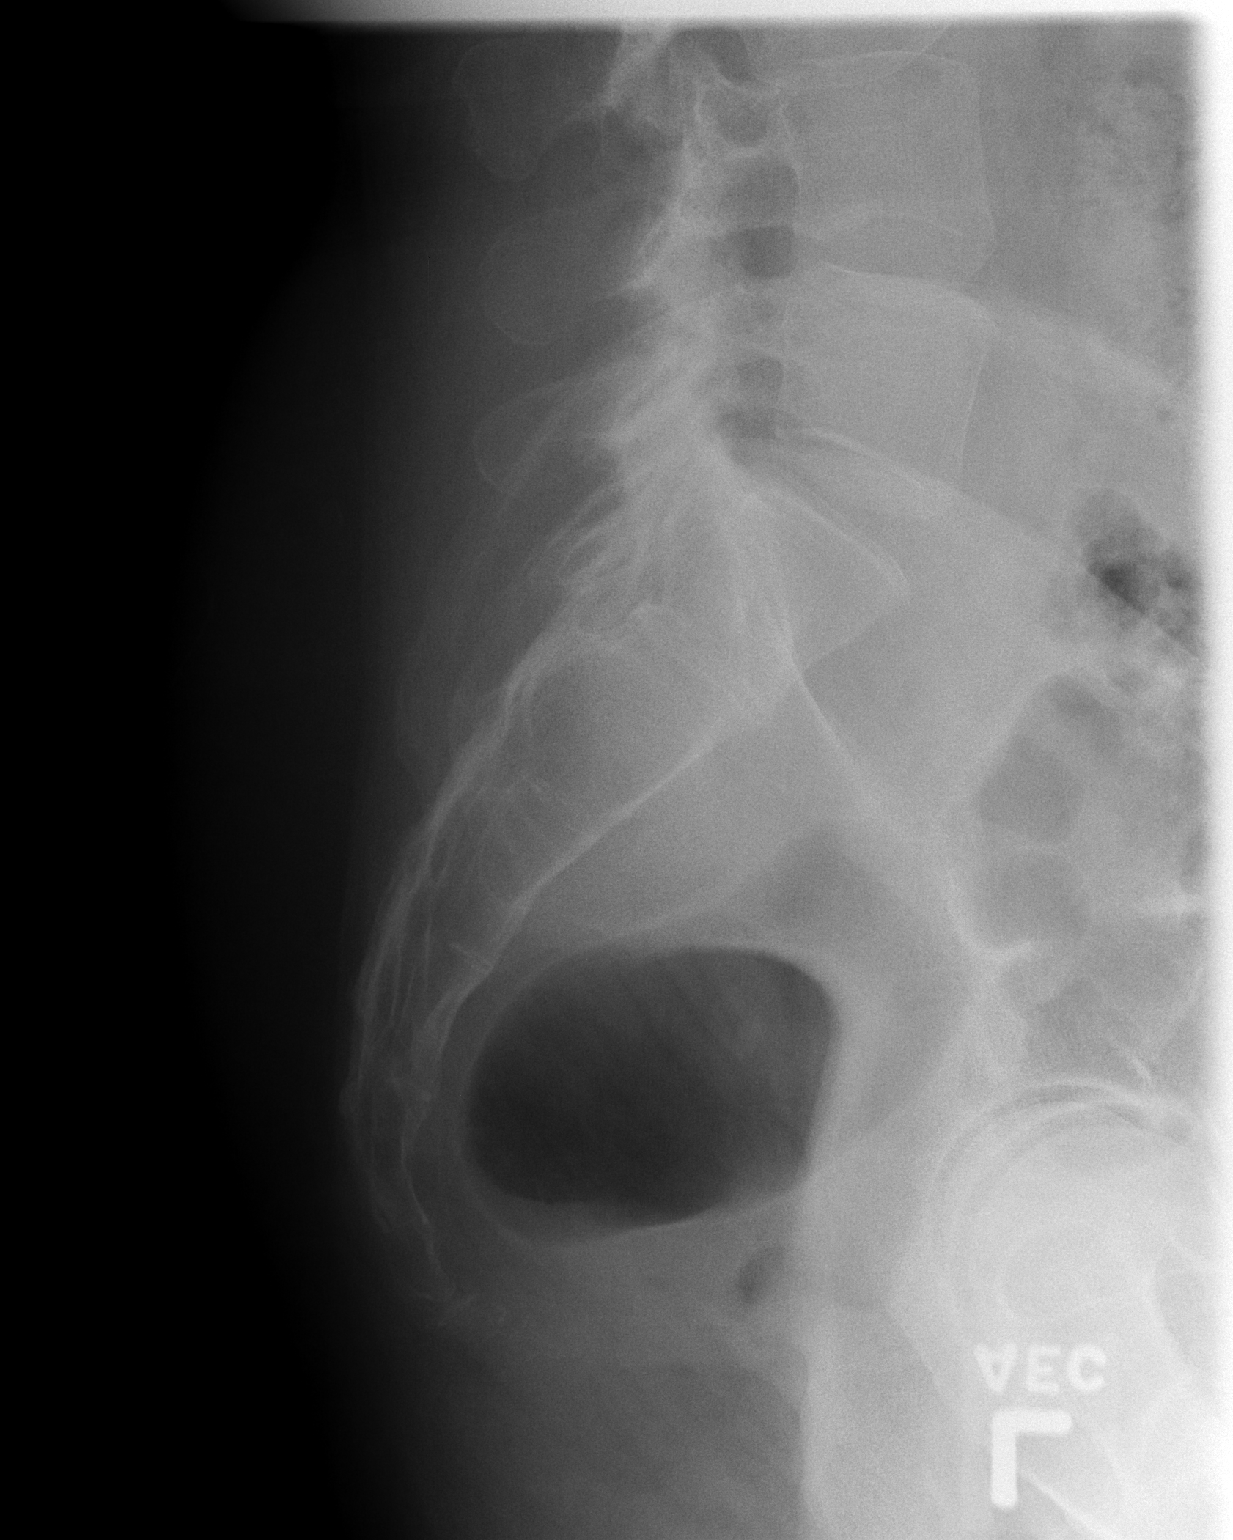

[3 of 3 positions shown; findings below may reference images not displayed]

FINDINGS: No fracture.
IMPRESSION: No fracture.

## 2008-05-26 ENCOUNTER — Encounter: Payer: Self-pay | Admitting: Family Medicine

## 2008-05-26 ENCOUNTER — Ambulatory Visit: Payer: Self-pay | Admitting: Physician Assistant

## 2008-06-07 ENCOUNTER — Other Ambulatory Visit: Payer: Self-pay

## 2008-06-07 ENCOUNTER — Emergency Department: Payer: Self-pay | Admitting: Emergency Medicine

## 2008-06-28 ENCOUNTER — Ambulatory Visit: Payer: Self-pay | Admitting: Physician Assistant

## 2008-07-07 IMAGING — MR MRI OF THE RIGHT KNEE WITHOUT CONTRAST
5 series · 36 of 40 positions shown · non-contrast
Comparison: none

REASON FOR EXAM: Right knee pain
COMMENTS:

[Series 3: T2 · axial · 5.0mm · 0.50mm/px · z∈[-73,+130]mm · 8 of 26 slices shown]
[im 1/26]
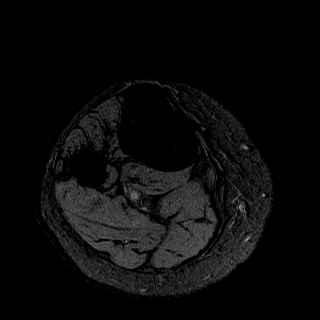
[im 3/26]
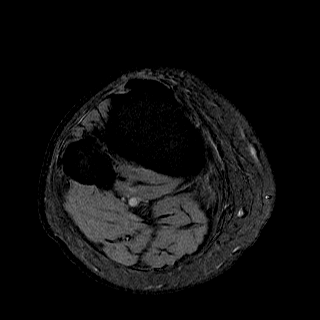
[im 9/26]
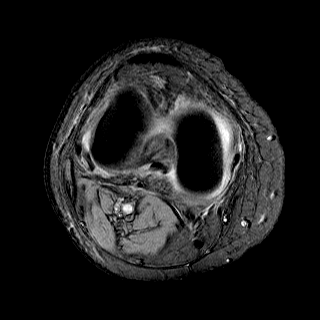
[im 12/26]
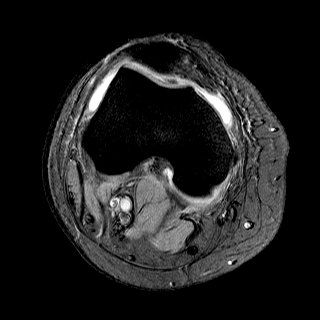
[im 14/26]
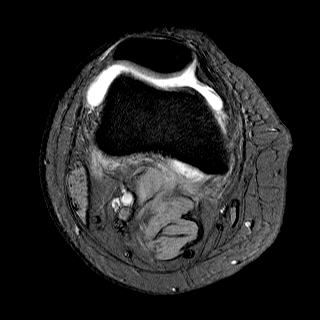
[im 17/26]
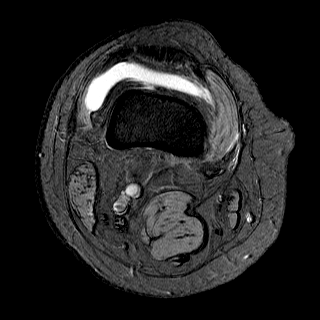
[im 23/26]
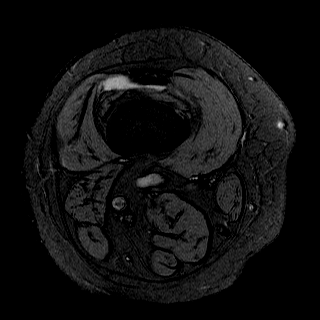
[im 26/26]
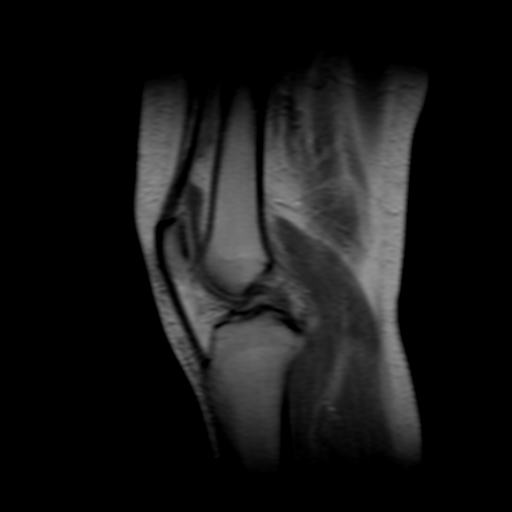

[Series 5: T2 fat-sat · axial · 5.0mm · 0.50mm/px · z∈[-7,+55]mm · 8 of 24 slices shown]
[im 1/24]
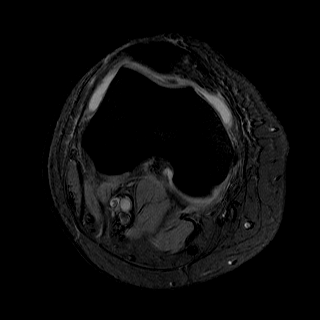
[im 4/24]
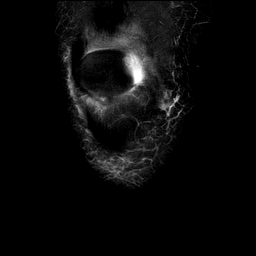
[im 7/24]
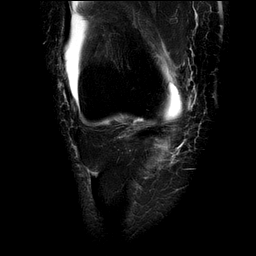
[im 10/24]
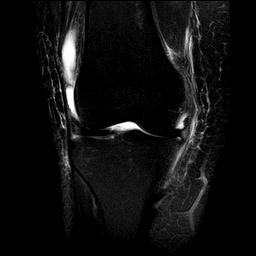
[im 14/24]
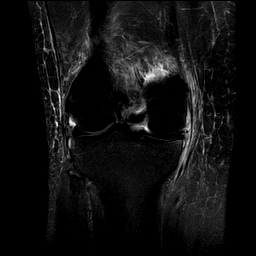
[im 17/24]
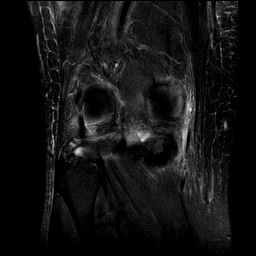
[im 20/24]
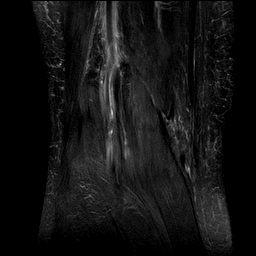
[im 24/24]
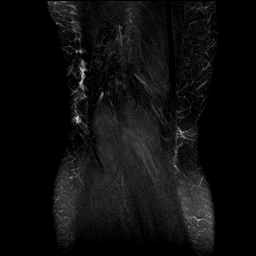

[Series 7: PD · axial · 5.0mm · 0.50mm/px · z∈[-7,+55]mm · 8 of 24 slices shown (1 of 2)]
[im 1/24]
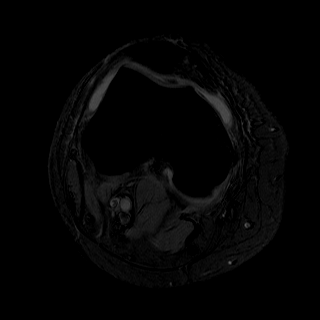
[im 4/24]
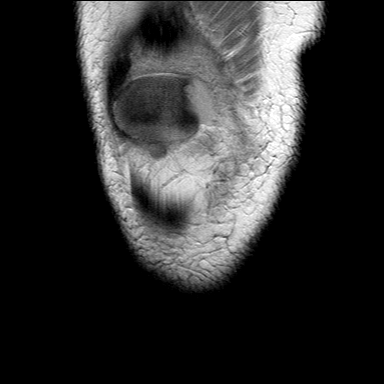
[im 7/24]
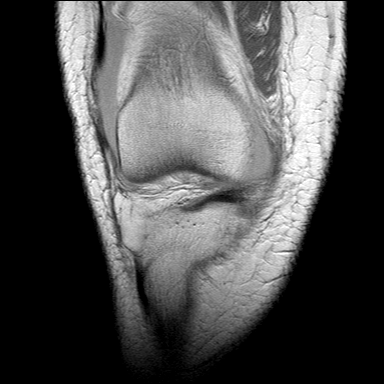
[im 10/24]
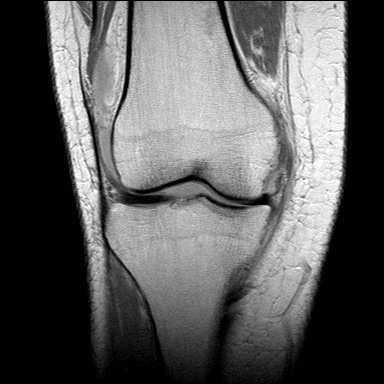
[im 14/24]
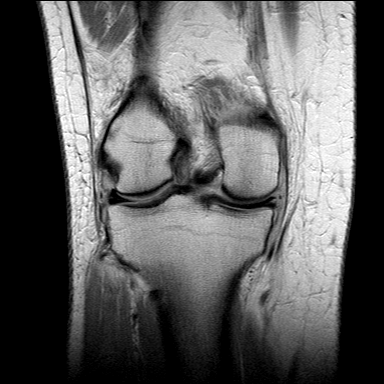
[im 17/24]
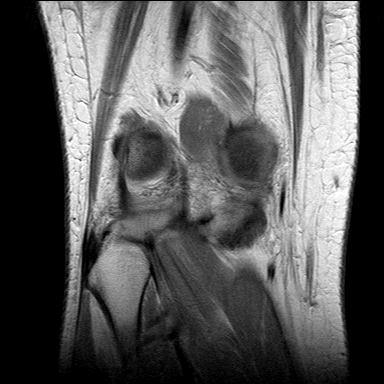
[im 20/24]
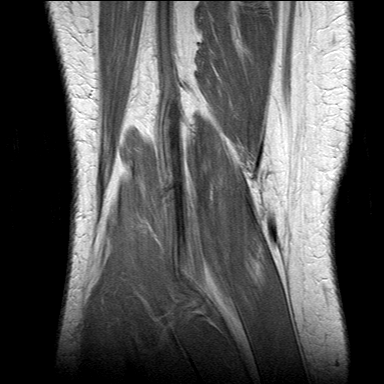
[im 24/24]
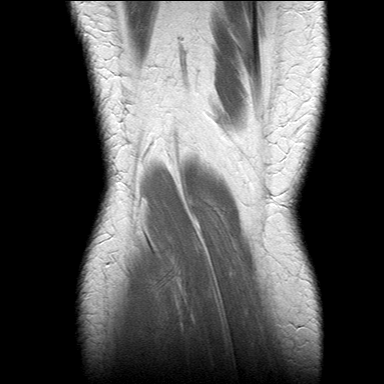

[Series 9: PD · axial · 5.0mm · 0.50mm/px · z∈[-19,+64]mm · 7 of 22 slices shown (2 of 2)]
[im 1/22]
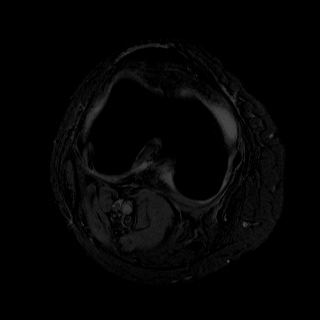
[im 4/22]
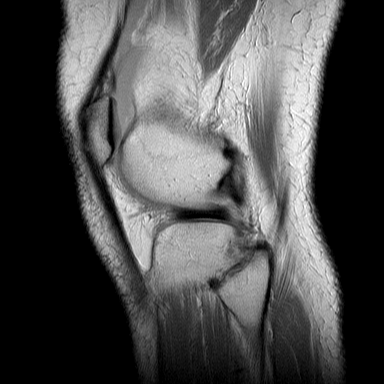
[im 8/22]
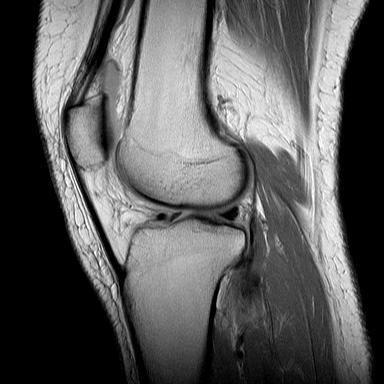
[im 11/22]
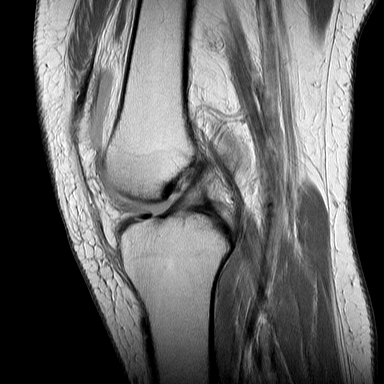
[im 15/22]
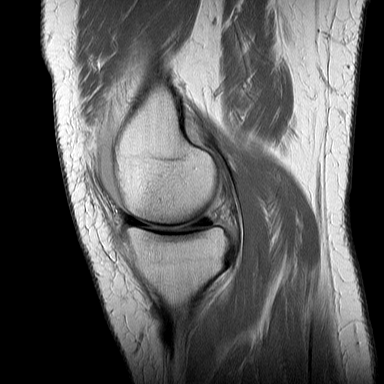
[im 18/22]
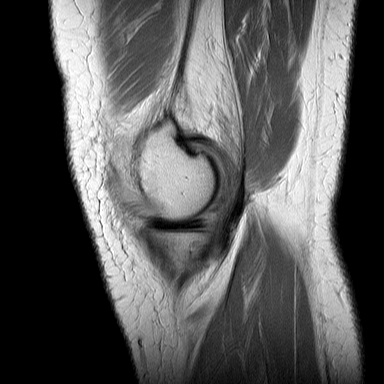
[im 22/22]
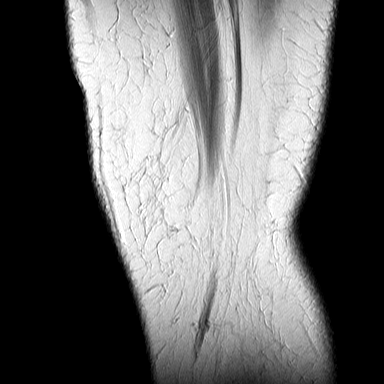

[Series 11: GRE · axial · 5.0mm · 0.50mm/px · z∈[-19,+62]mm · 5 of 22 slices shown]
[im 1/22]
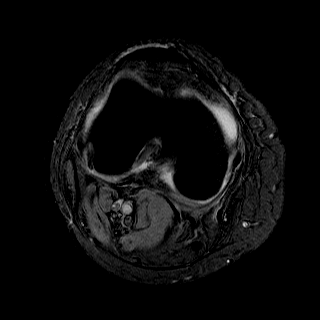
[im 4/22]
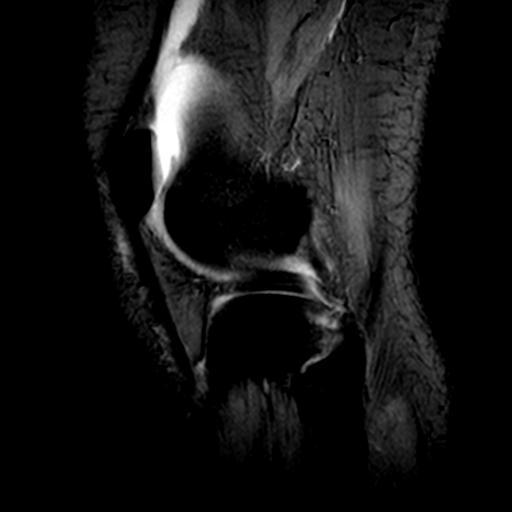
[im 8/22]
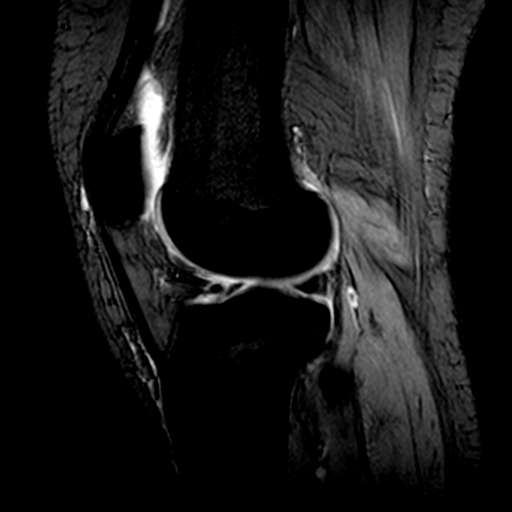
[im 11/22]
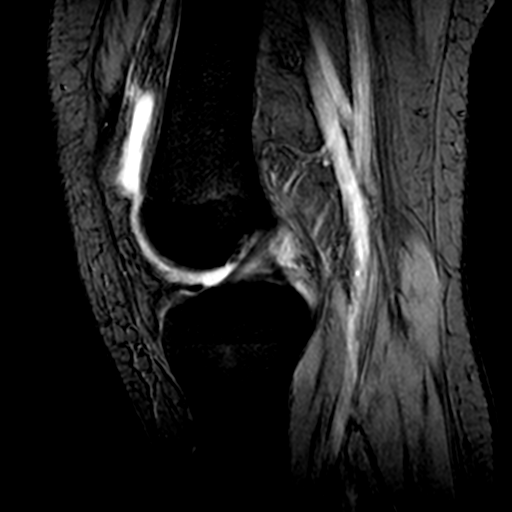
[im 15/22]
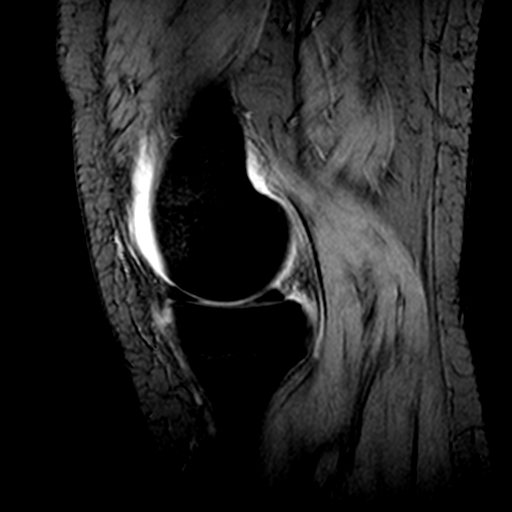

[36 of 40 positions shown; findings below may reference images not displayed]

PROCEDURE:     MR  - MR KNEE RT  WO  CONTRAST  - August 14, 2006  [DATE]

RESULT:     Multiplanar multisequence imaging of the RIGHT knee is obtained.
 Horizontal tear of the posterior horn of the lateral meniscus is present.
There is also a subtle horizontal tear of the posterior horn of the medial
meniscus.  There is an accompanying horizontal tear of the anterior horn of
the medial meniscus.  Cruciate ligaments are intact.  Quadriceps and
patellar tendons are intact.  A small knee joint effusion is present.  The
collateral ligaments are intact.  No bony lesions are identified.
IMPRESSION: Subtle horizontal tear of the posterior horn of the lateral meniscus.

Subtle horizontal tear of the anterior and posterior horns of the medial
meniscus.

Prominent knee joint effusion.

## 2008-07-16 IMAGING — CT CT ABDOMEN W/ CM
2 of 6 series · 17 of 46 positions shown, 19 images · IV contrast (omnipaque)
Comparison: Comparison is made with CT 06/20/06.

CLINICAL DATA: Nausea and vomiting. History of cervical cancer. Hematuria. 
 ABDOMEN CT WITH CONTRAST:
TECHNIQUE: Multidetector CT imaging of the abdomen was performed following the standard protocol during bolus administration of intravenous contrast.
 Contrast:  100 mL Omnipaque 300 IV.
TECHNIQUE: Multidetector CT imaging of the pelvis was performed following the standard protocol during bolus administration of intravenous contrast.

[Series 2: abd_pel 5.0 b40s · axial · 0.69mm/px · z∈[-422,-6]mm · 14 of 95 slices shown, 16 images]
[im 6/95  soft-tissue]
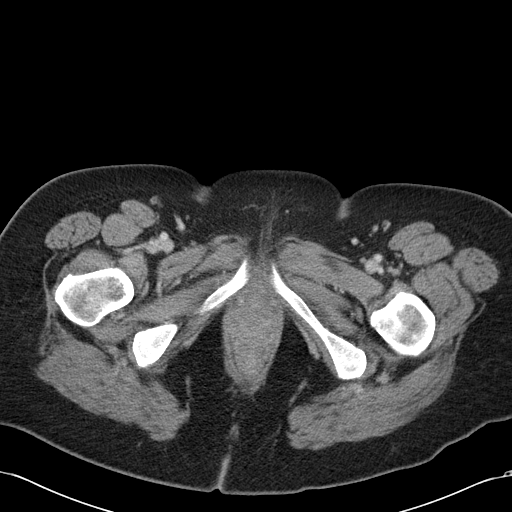
[im 6/95  bone]
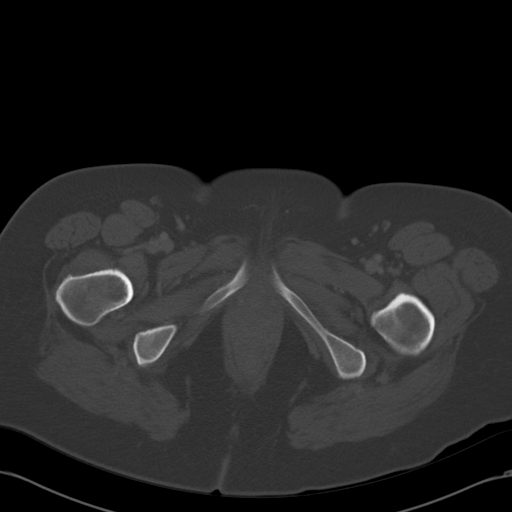
[im 11/95  soft-tissue]
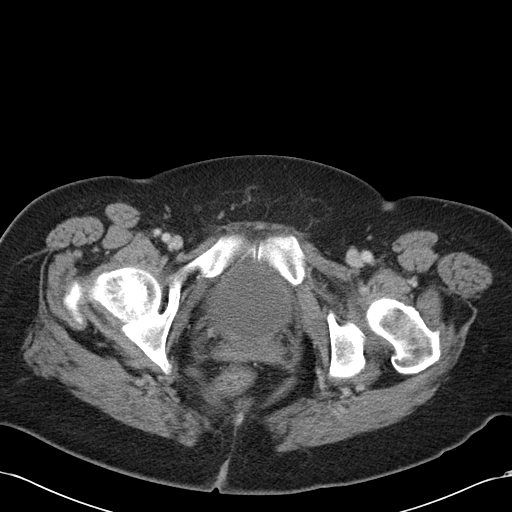
[im 21/95  soft-tissue]
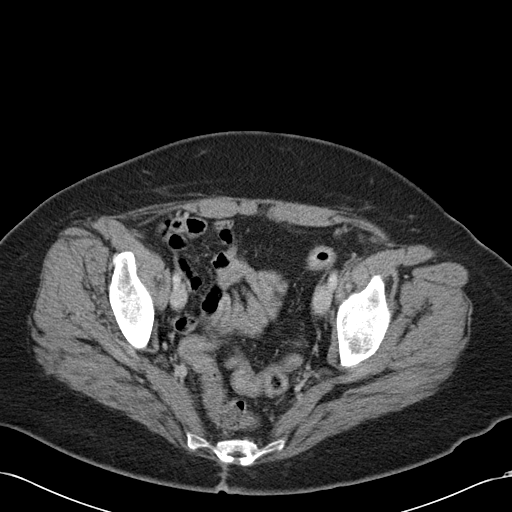
[im 27/95  soft-tissue]
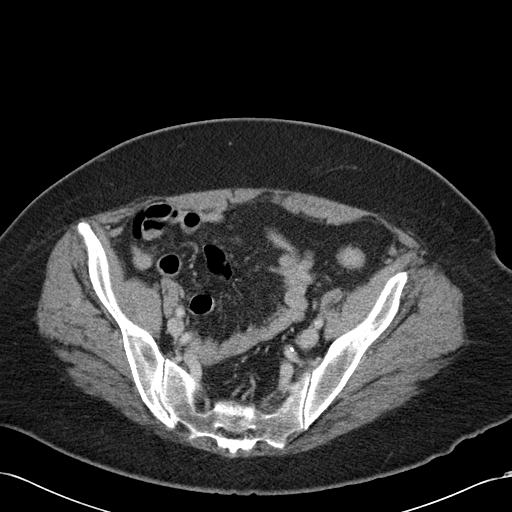
[im 32/95  soft-tissue]
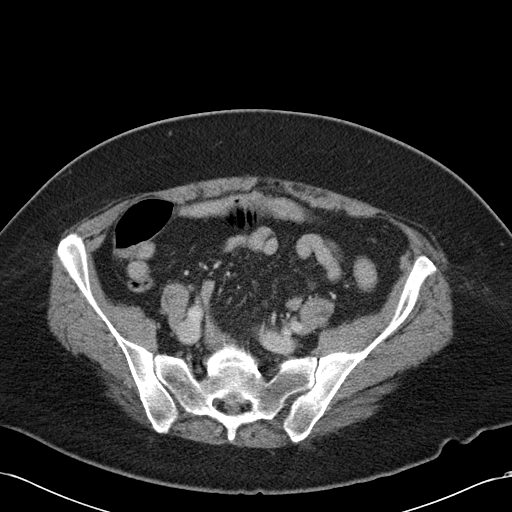
[im 37/95  soft-tissue]
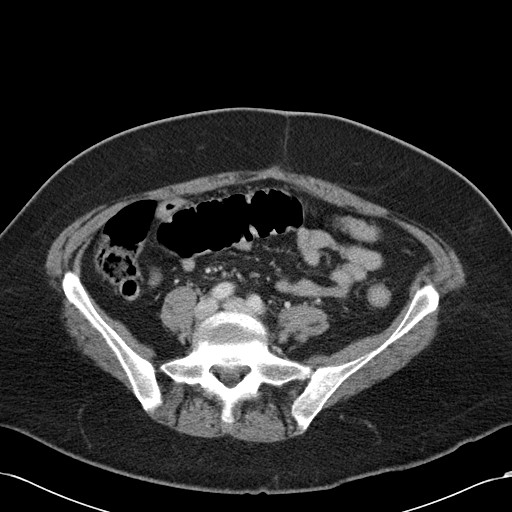
[im 42/95  soft-tissue]
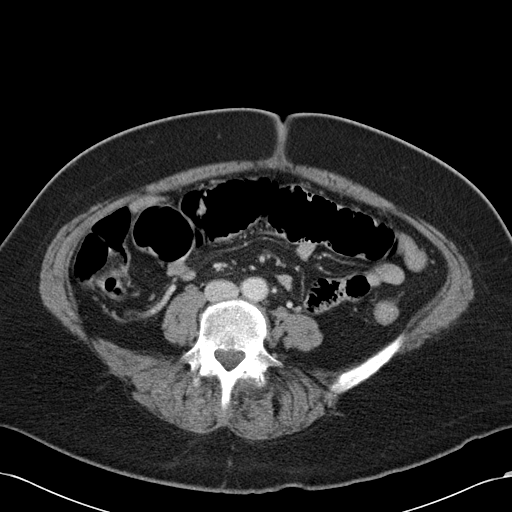
[im 53/95  soft-tissue]
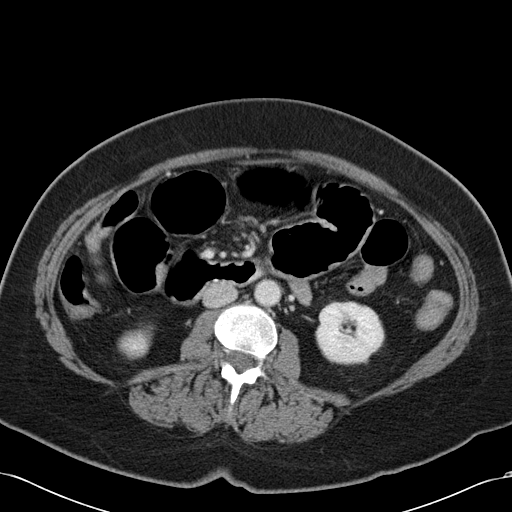
[im 58/95  soft-tissue]
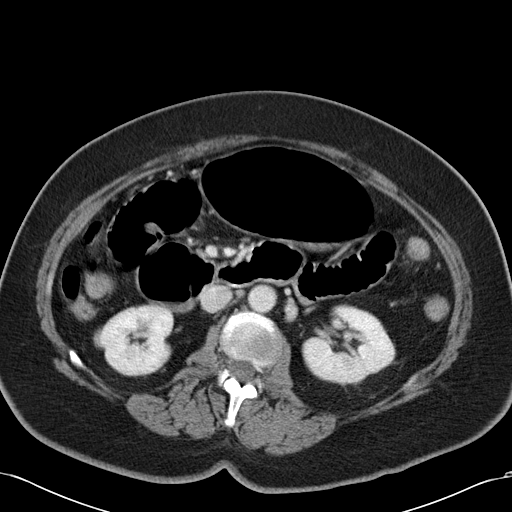
[im 58/95  bone]
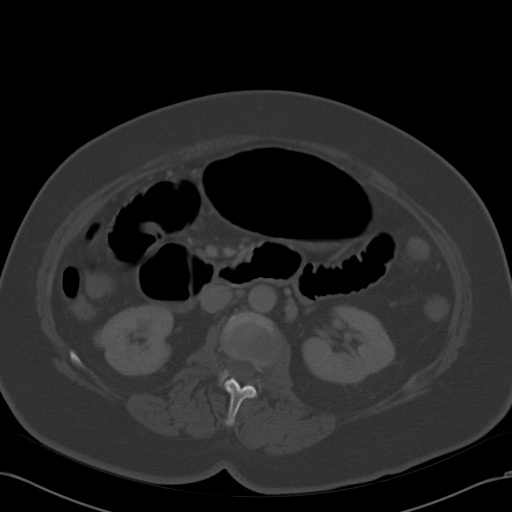
[im 63/95  soft-tissue]
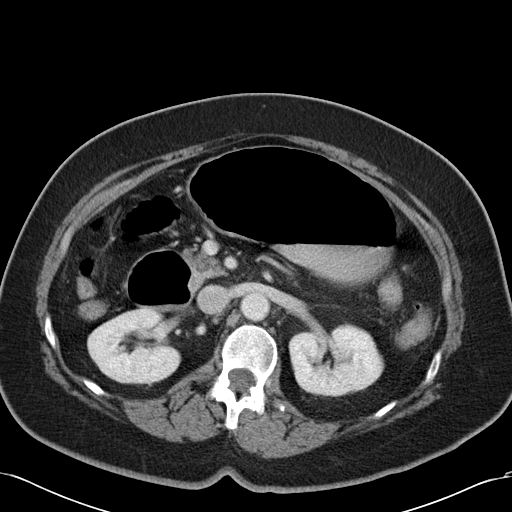
[im 68/95  soft-tissue]
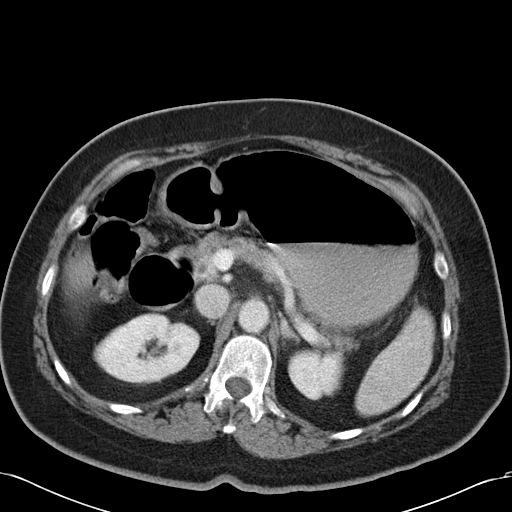
[im 74/95  soft-tissue]
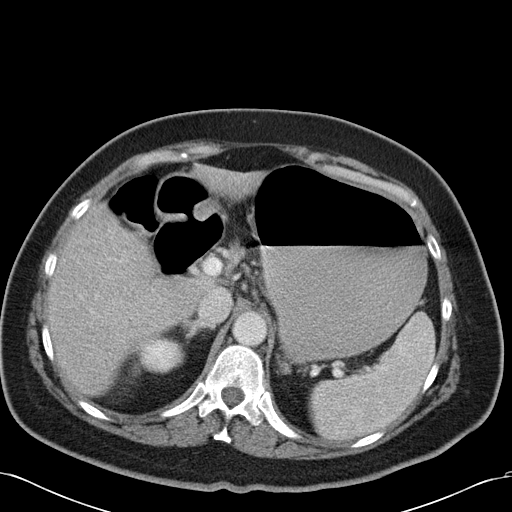
[im 84/95  soft-tissue]
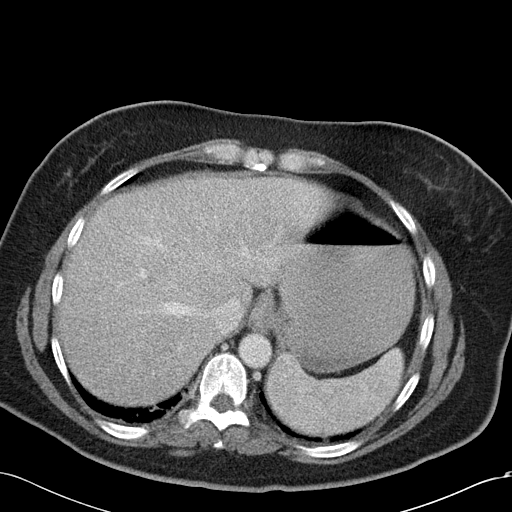
[im 89/95  soft-tissue]
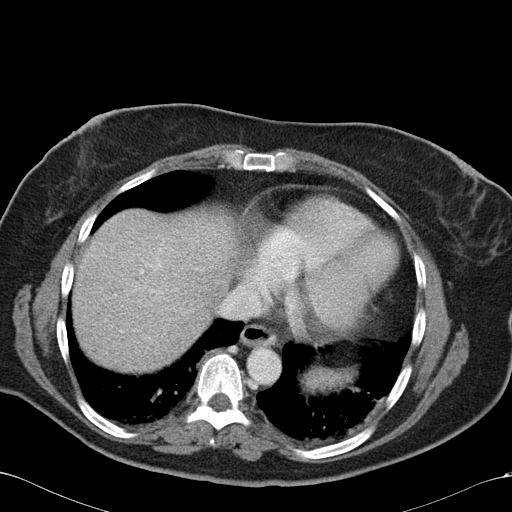

[Series 602: coronal · coronal · 0.93mm/px · 3 of 53 slices shown]
[im 18/53  soft-tissue]
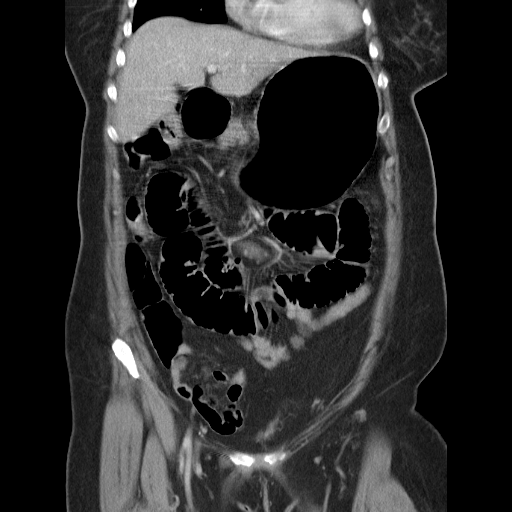
[im 24/53  soft-tissue]
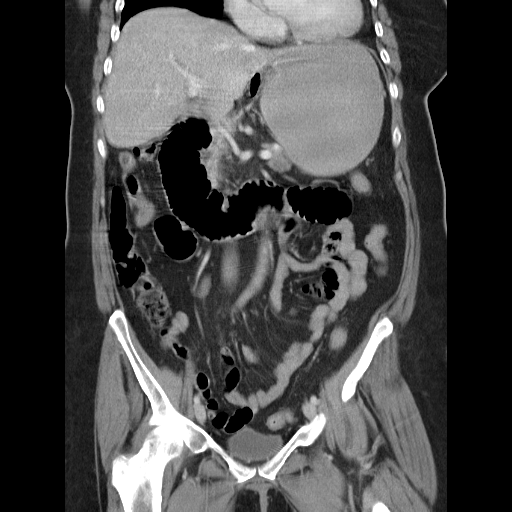
[im 29/53  soft-tissue]
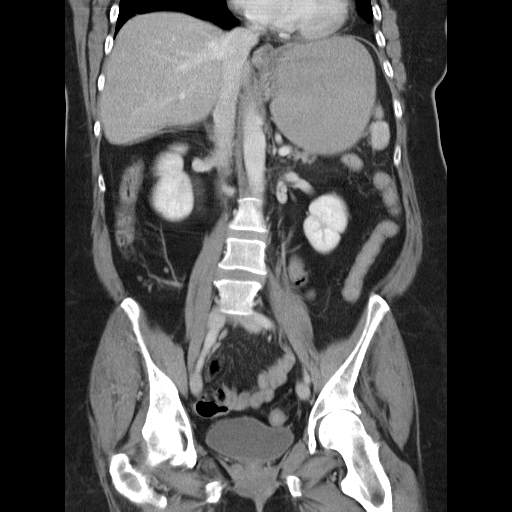

[17 of 46 positions shown; findings below may reference images not displayed]

FINDINGS: The liver, spleen, pancreas, and kidneys are normal.  There may be a tiny cyst on the right kidney.  The stomach and proximal small bowel are dilated, similar to that seen on the abdominal series today and also similar to the CT scan of 06/20/06. The distal small bowel is decompressed. This is most compatible with a mid small bowel obstruction. I do not see an obstructing mass or intussusception. There is no free fluid.
IMPRESSION: Partial mid small bowel obstruction similar to the CT of 06/20/06. 
 PELVIS CT WITH CONTRAST:
FINDINGS: The colon is normal in caliber. There is mild sigmoid diverticulosis. The small bowel is decompressed.  No mass lesion is seen and there is no fluid or adenopathy.
IMPRESSION: Mid small bowel obstruction without etiology detected.

## 2008-07-16 IMAGING — CR DG ABDOMEN ACUTE W/ 1V CHEST
3 series · 3 of 3 positions shown · non-contrast
Comparison: 06/19/06.

CLINICAL DATA: Upper abdominal pain, nausea, vomiting and diarrhea.  
 ACUTE ABDOMINAL SERIES:

[w chest pa]
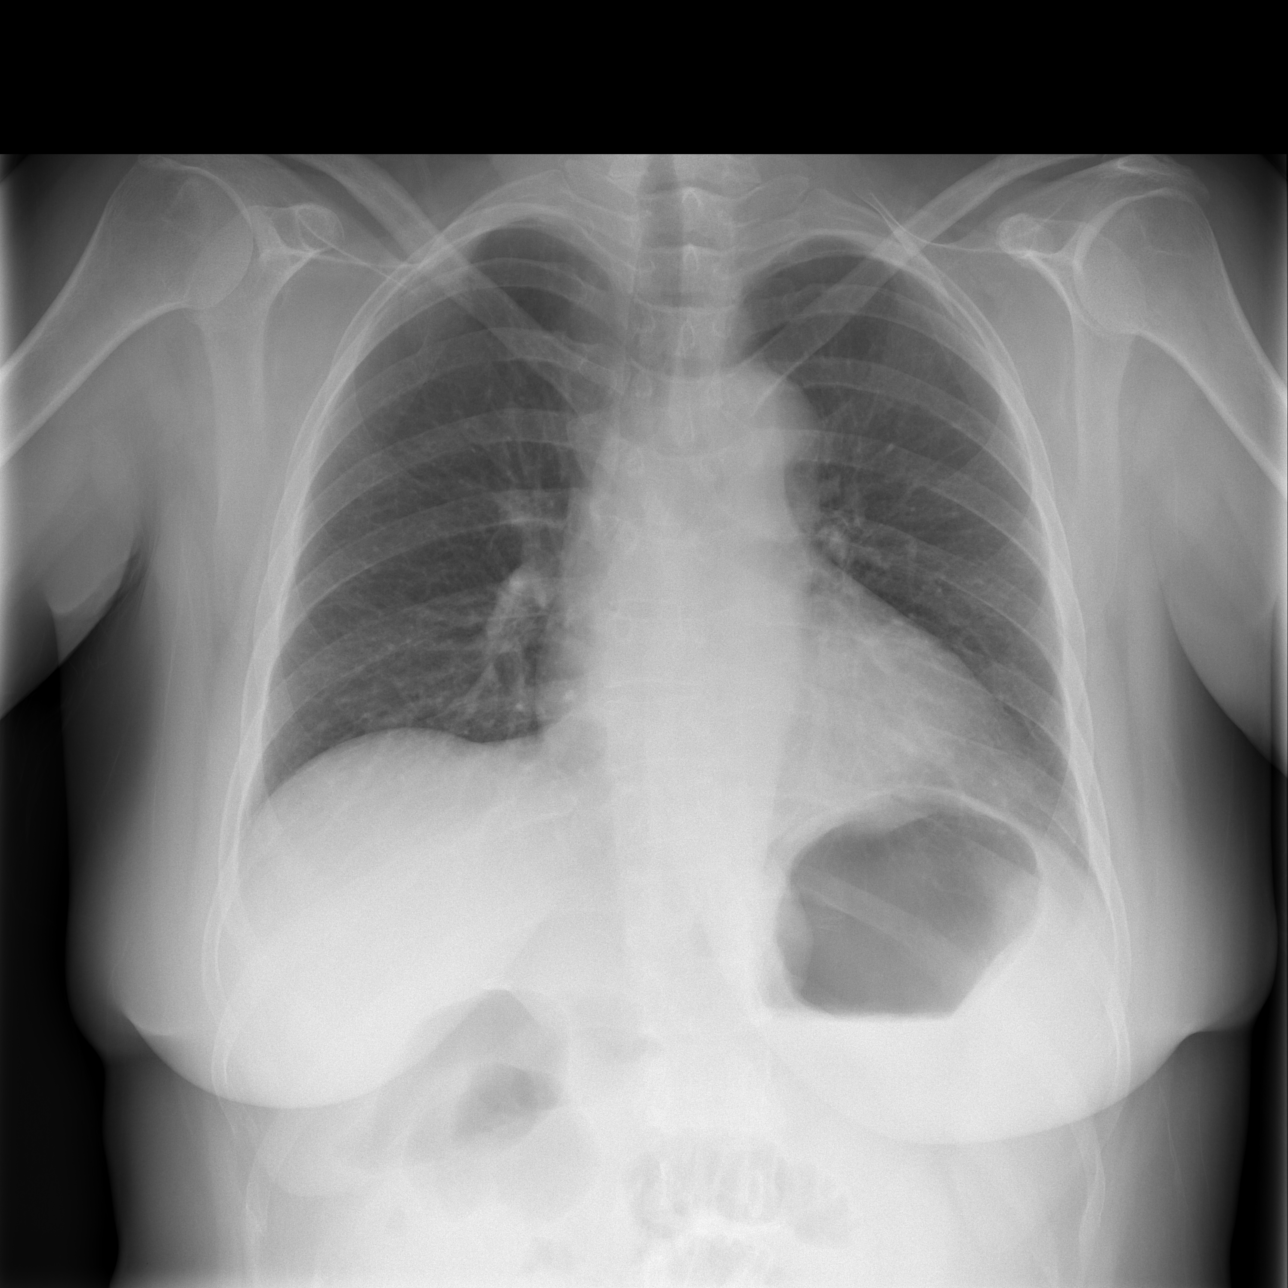

[w abdomen upright *]
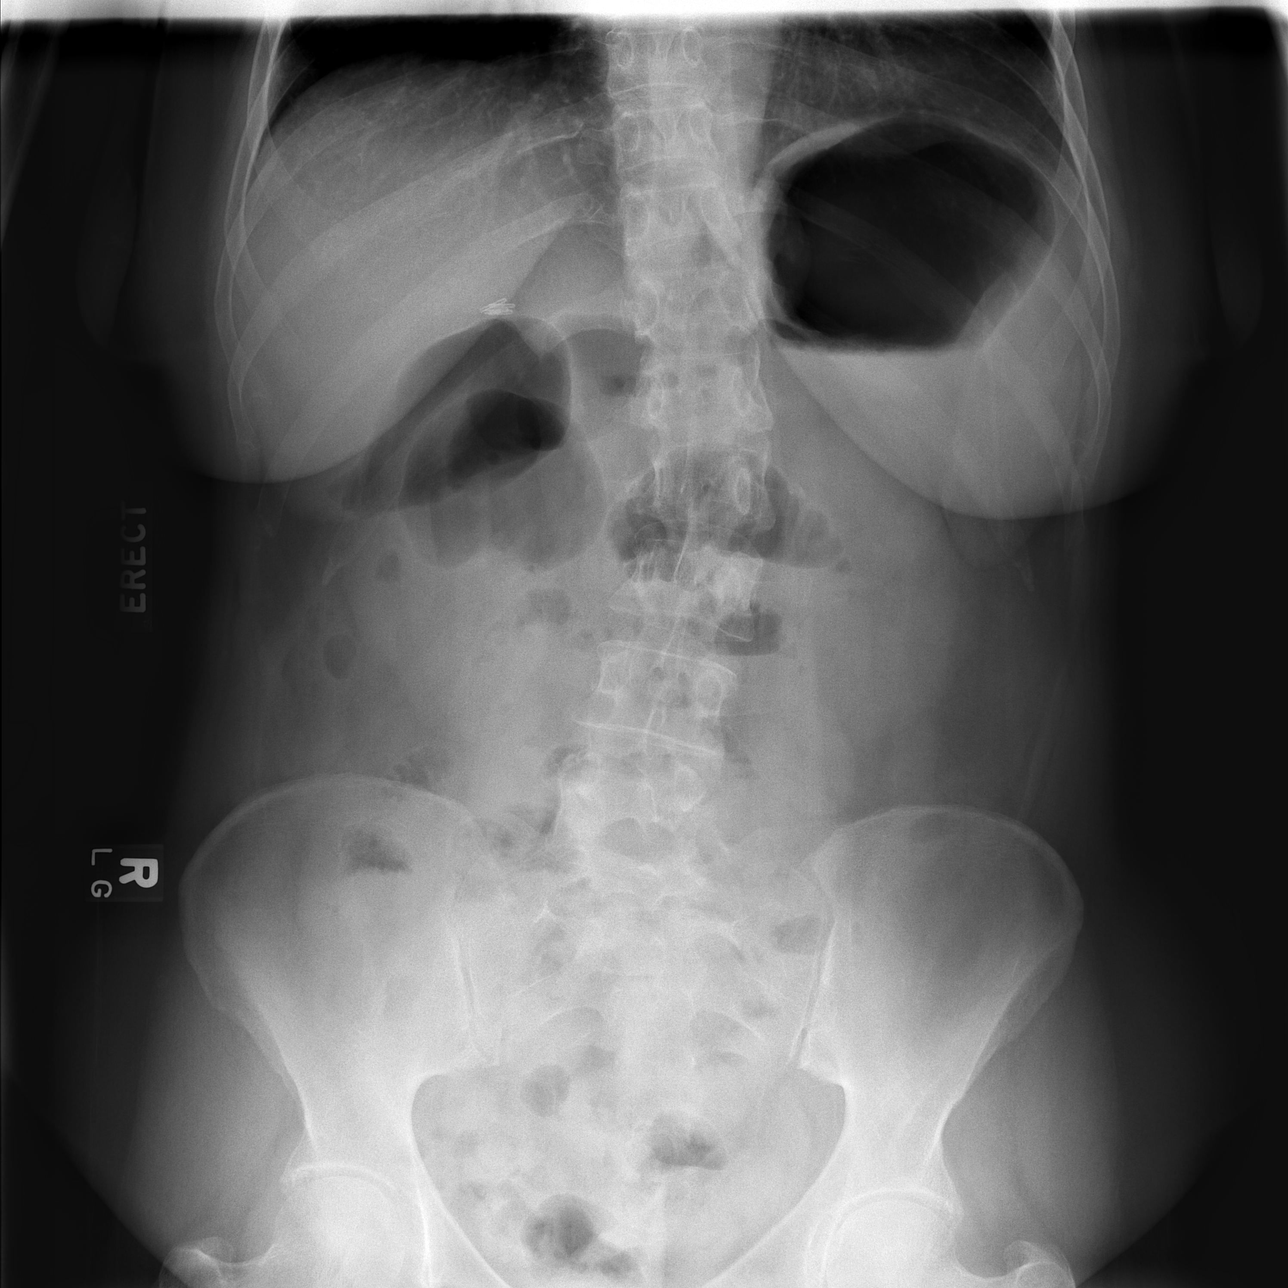

[t abdomen supine]
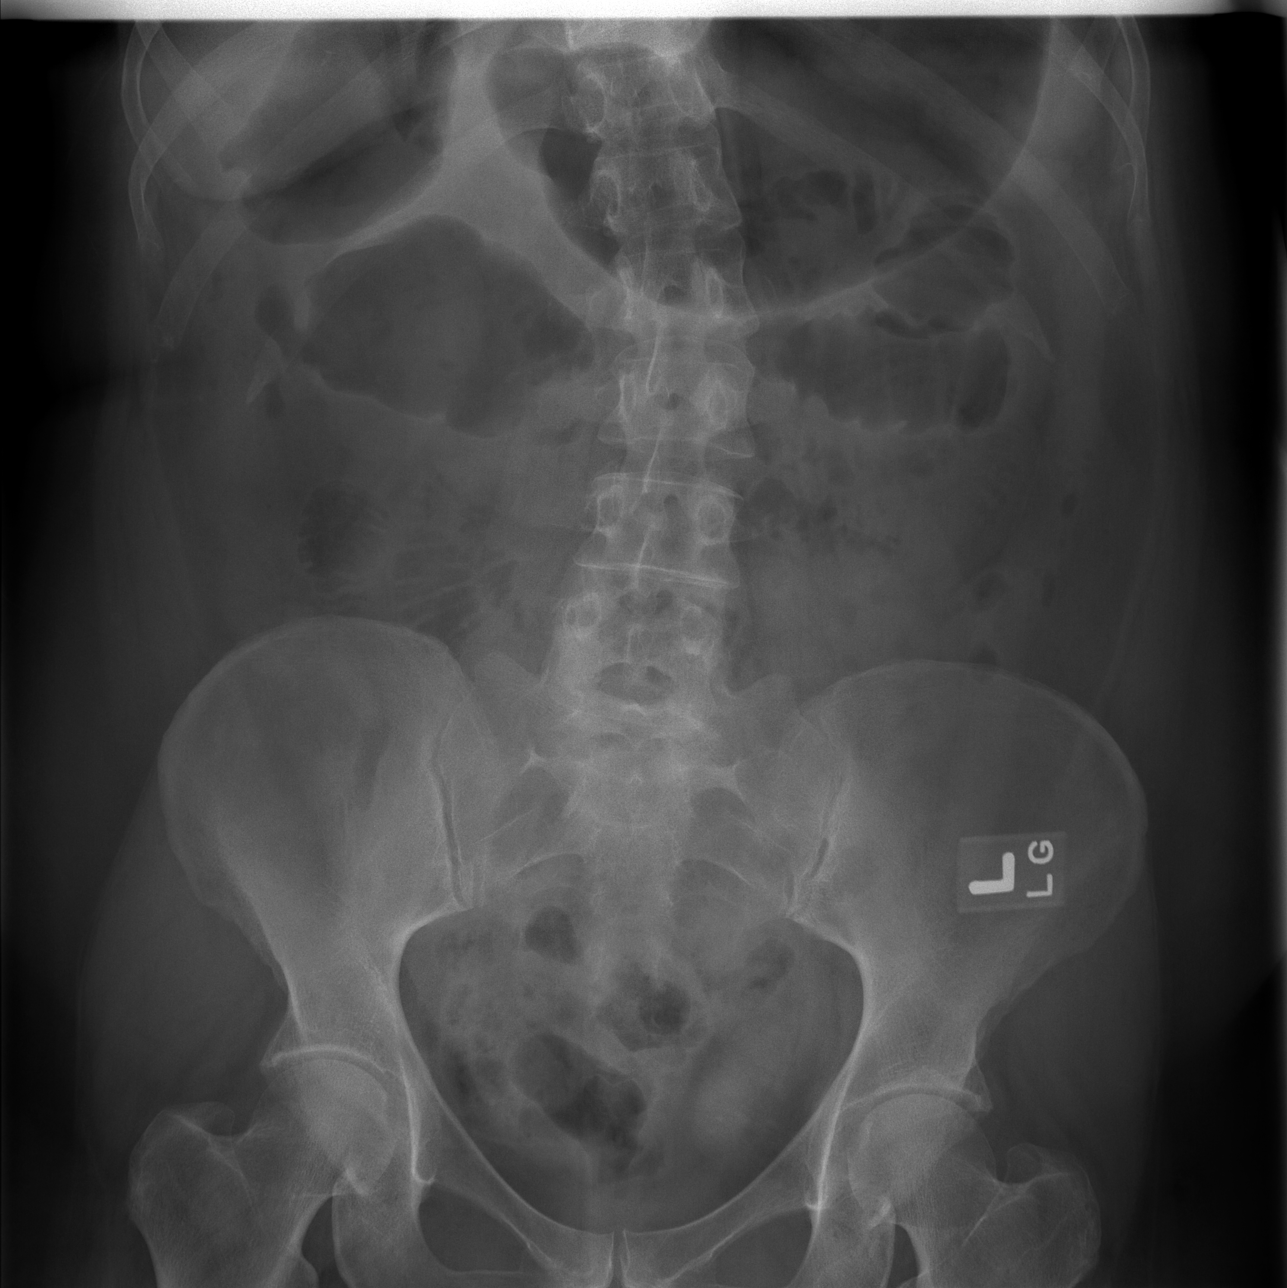

[3 of 3 positions shown; findings below may reference images not displayed]

FINDINGS: Heart size is normal.  No effusions or edema.  No airspace opacities are noted.  
 There are abnormally dilated loops of small bowel which are concerning for obstruction.  Some gas and stool is seen within the rectum.
IMPRESSION: Findings consistent with small bowel obstruction.

## 2008-07-17 IMAGING — CR DG ABDOMEN 2V
2 series · 2 of 2 positions shown · non-contrast
Comparison: 08/23/2006

CLINICAL DATA: Left lower quadrant pain

ABDOMEN - 2 VIEW

[view not recorded (1 of 2)]
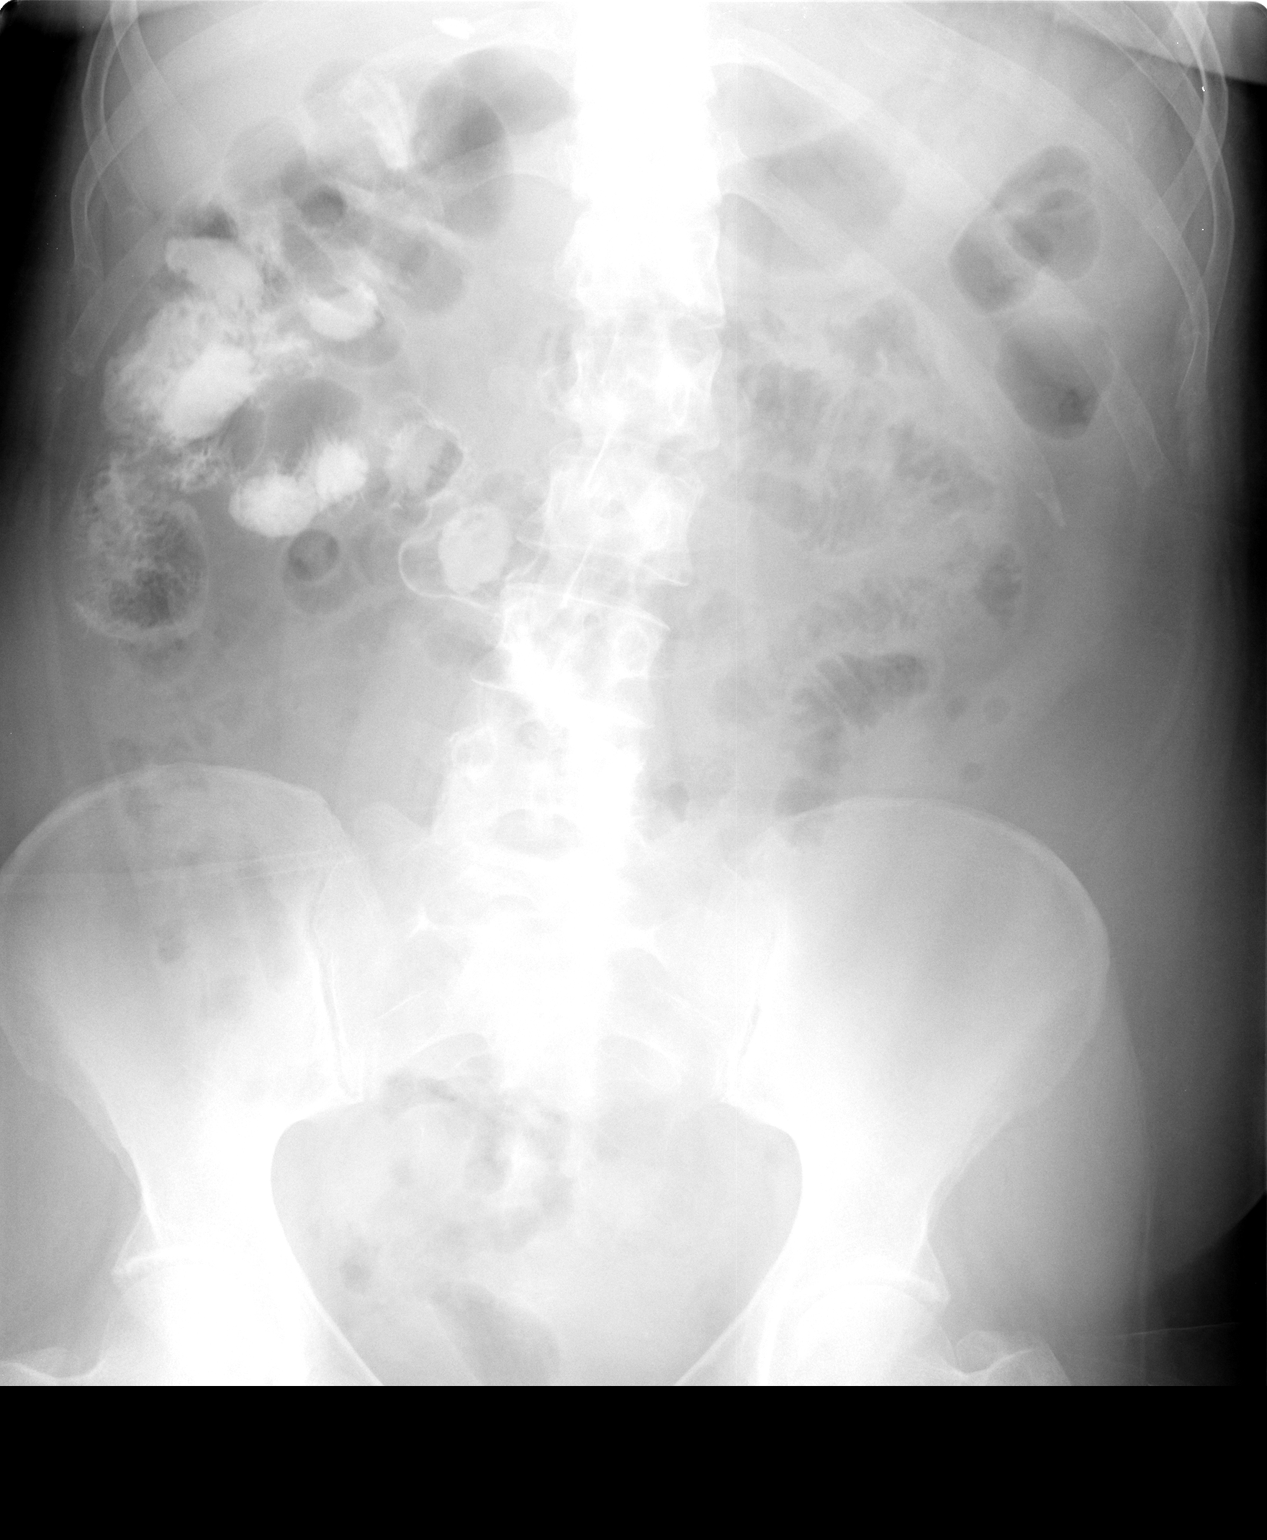

[view not recorded (2 of 2)]
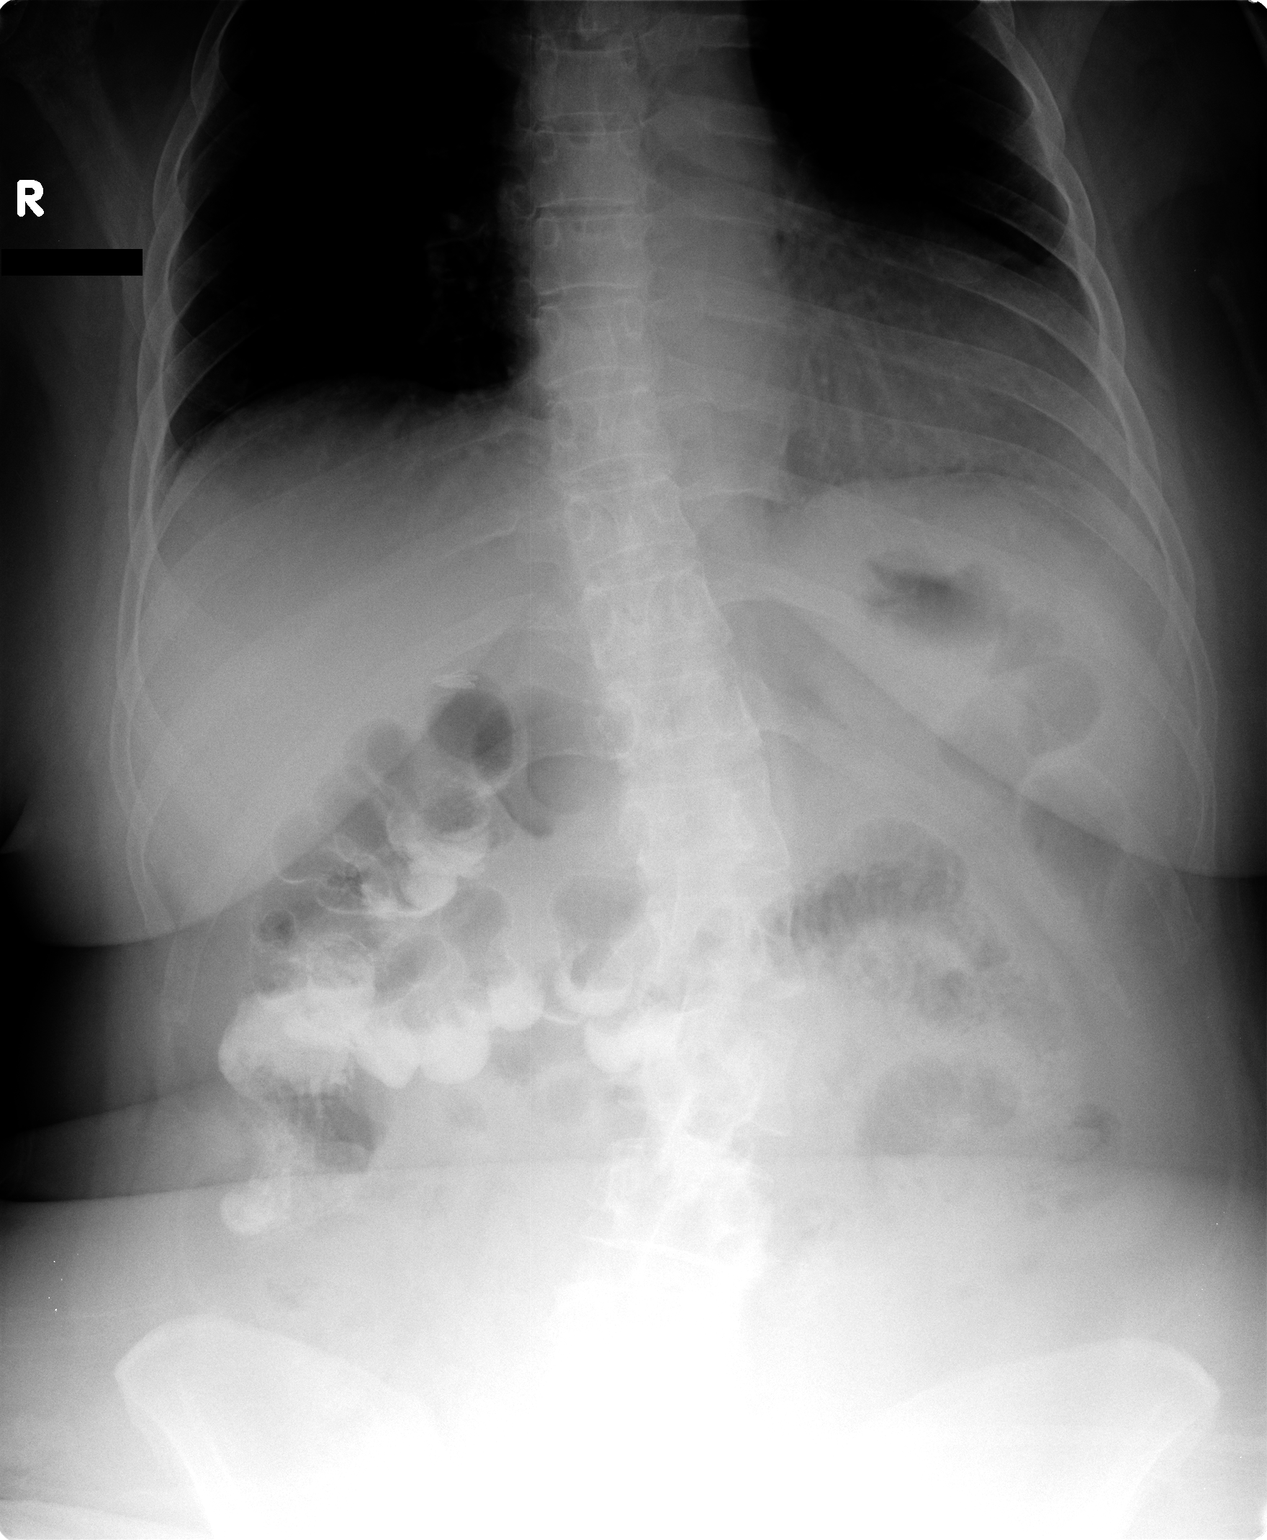

[2 of 2 positions shown; findings below may reference images not displayed]

FINDINGS: There is decreasing small bowel distention. Currently, no dilated
small bowel loops. Contrast is seen within nondistended colon. No free air.

IMPRESSION

Currently no evidence for bowel obstruction.

## 2008-07-29 ENCOUNTER — Ambulatory Visit: Payer: Self-pay | Admitting: Physician Assistant

## 2008-08-26 ENCOUNTER — Ambulatory Visit: Payer: Self-pay | Admitting: Physician Assistant

## 2008-09-23 ENCOUNTER — Ambulatory Visit: Payer: Self-pay | Admitting: Physician Assistant

## 2008-10-27 IMAGING — US US ABDOMEN COMPLETE
1 series · 14 of 25 positions shown · non-contrast
Comparison: none

CLINICAL DATA: Abdominal pain and nausea.  
 ABDOMEN ULTRASOUND:
TECHNIQUE: Complete abdominal ultrasound examination was performed including evaluation of the liver, gallbladder, bile ducts, pancreas, kidneys, spleen, IVC, and abdominal aorta.

[Series 1: unknown · 0.34mm/px · 14 of 36 slices shown]
[im 1/36]
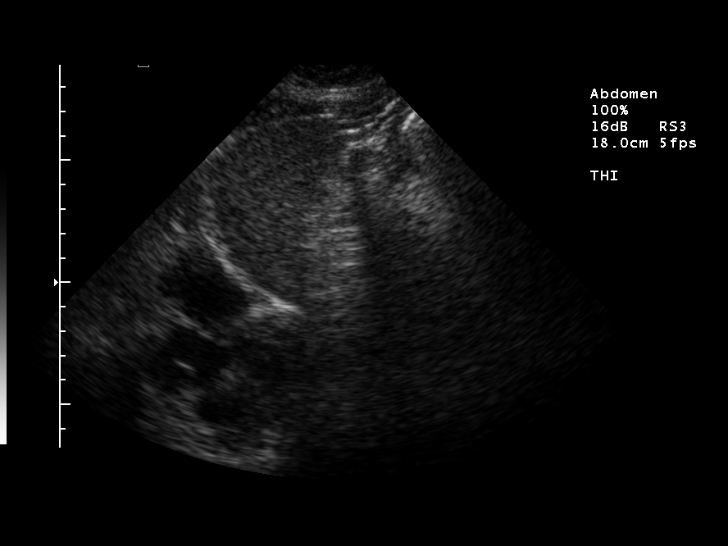
[im 3/36]
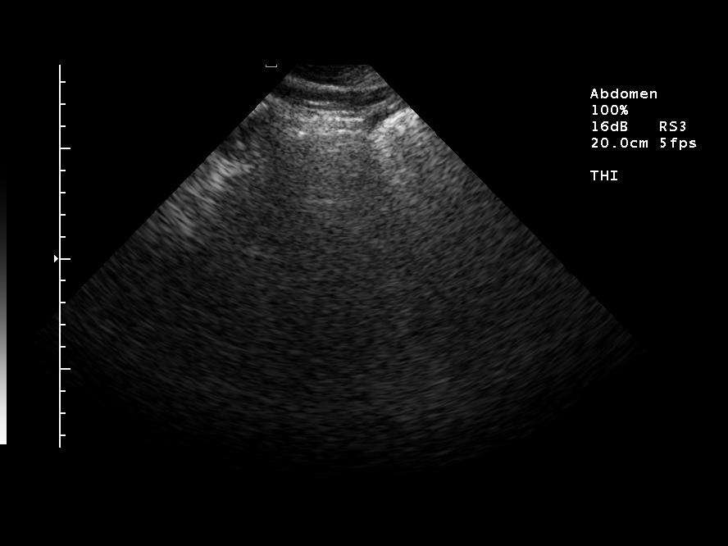
[im 6/36]
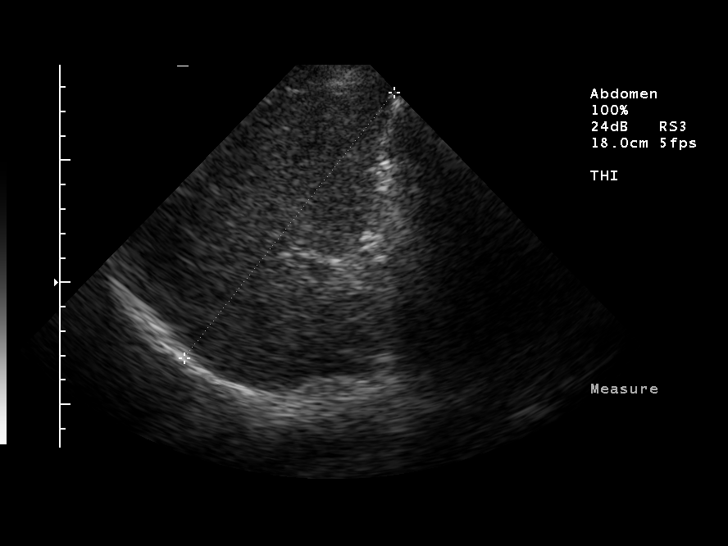
[im 9/36]
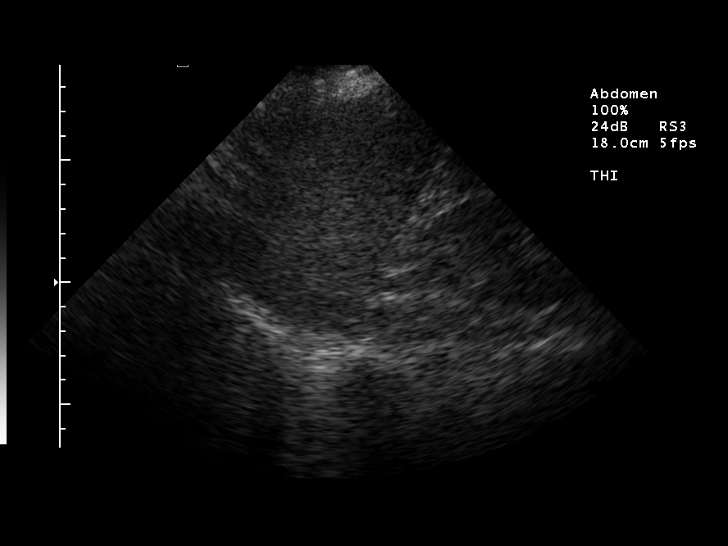
[im 12/36]
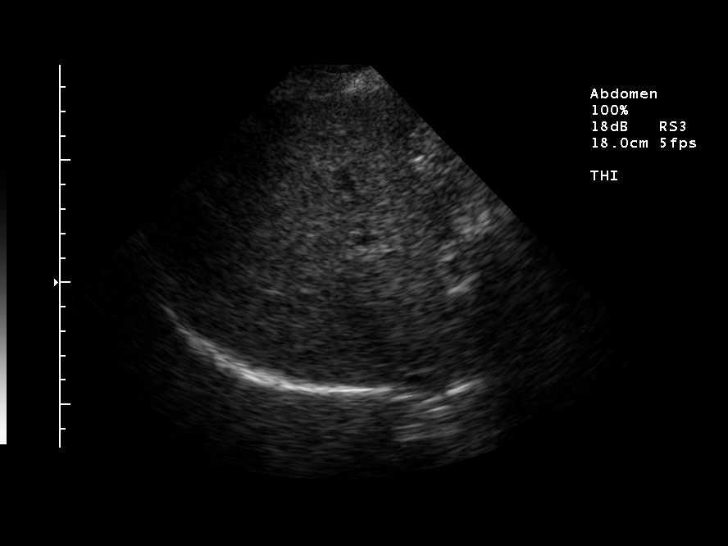
[im 14/36]
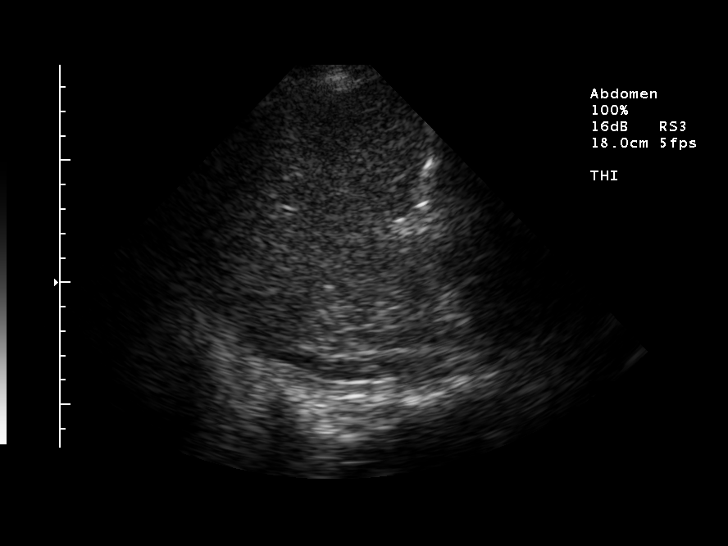
[im 17/36]
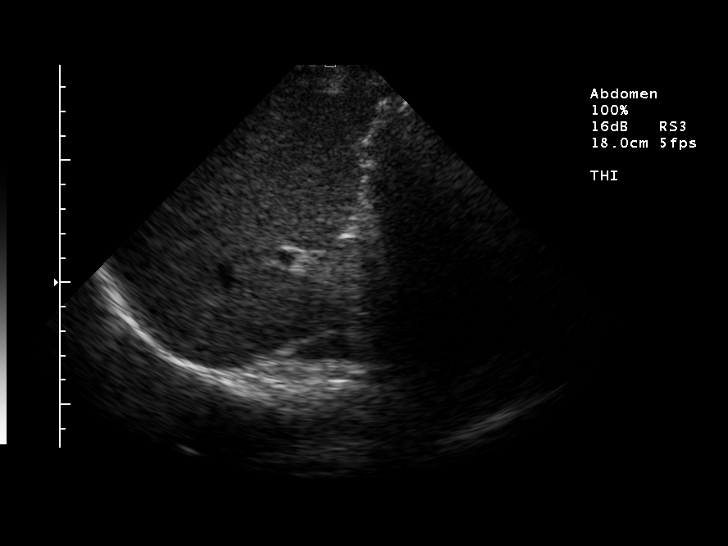
[im 19/36]
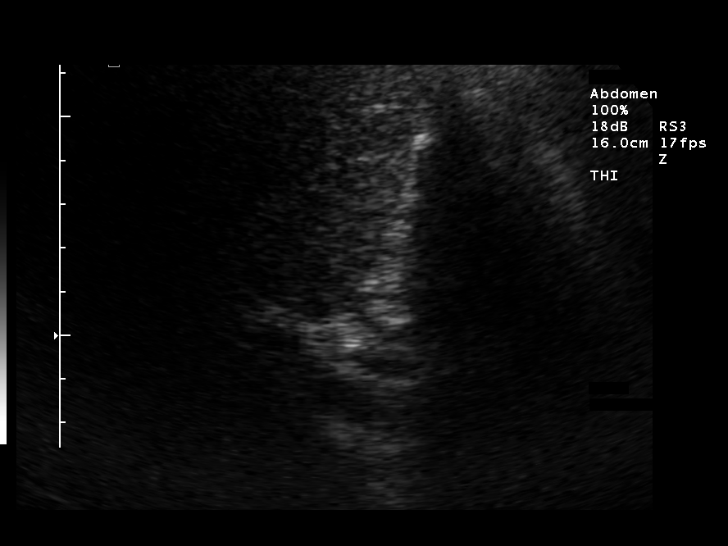
[im 22/36]
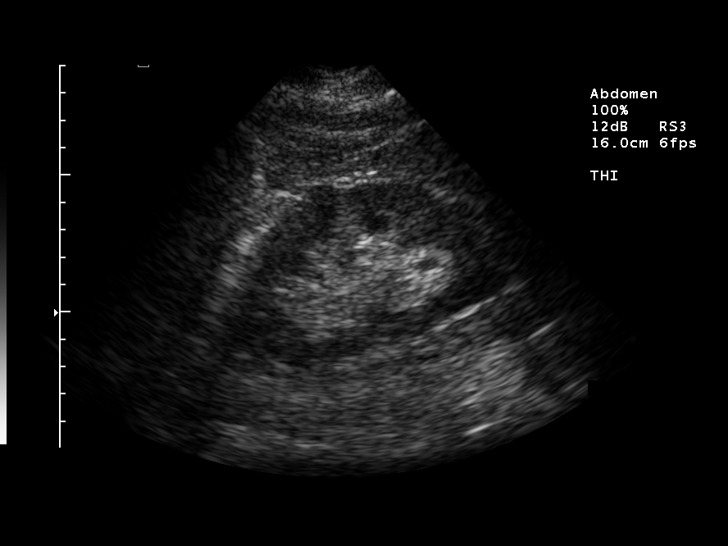
[im 24/36]
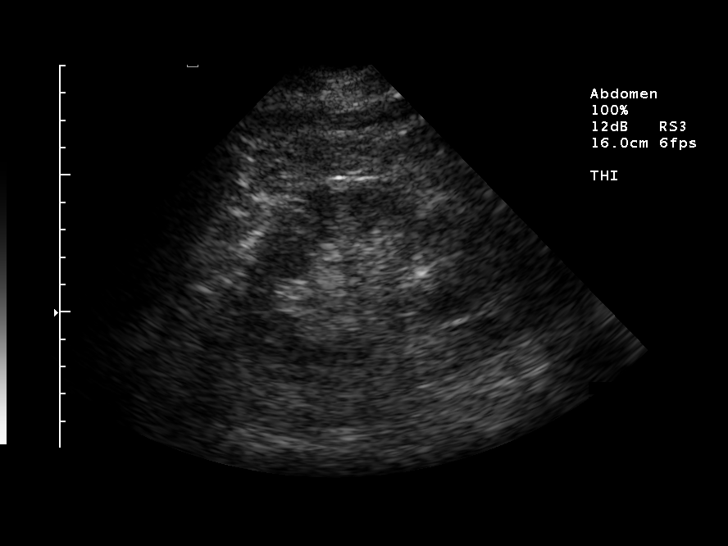
[im 27/36]
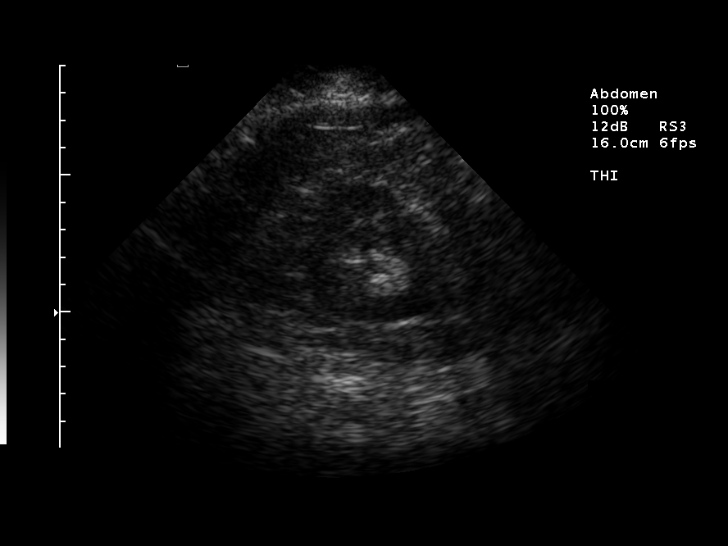
[im 30/36]
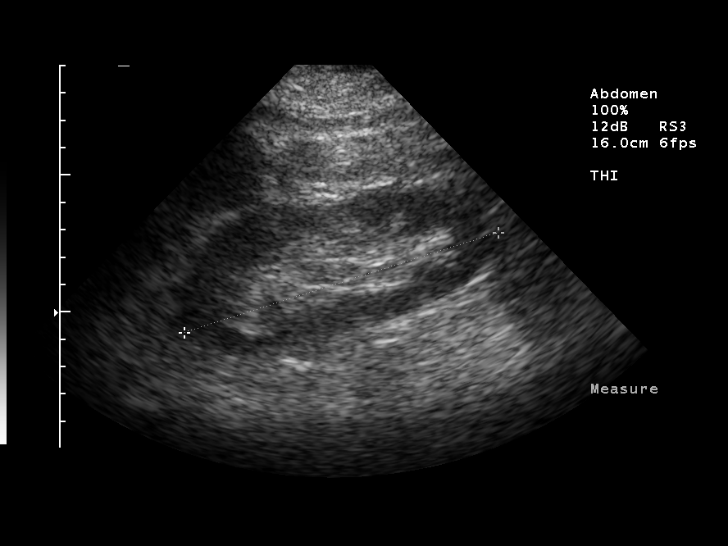
[im 33/36]
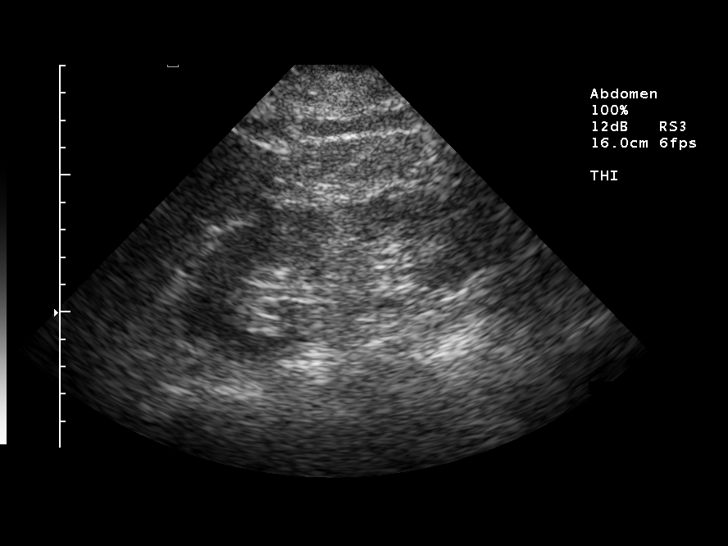
[im 36/36]
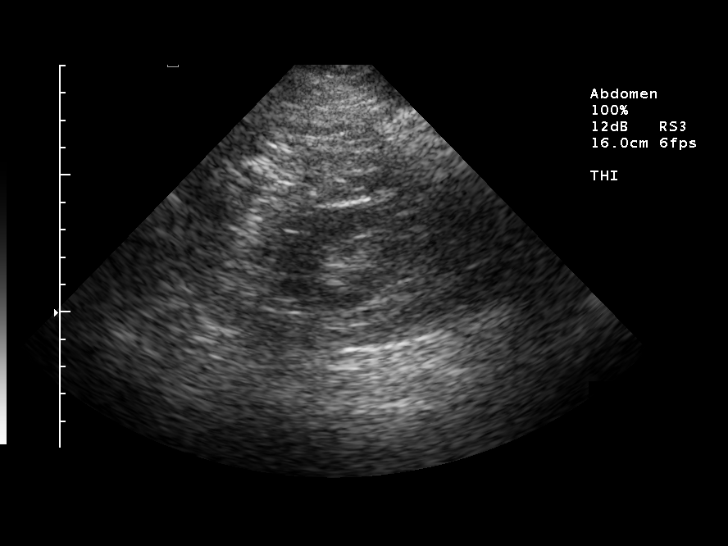

[14 of 25 positions shown; findings below may reference images not displayed]

FINDINGS: The gallbladder is surgically absent.   There is no evidence of biliary ductal dilatation with the common bile duct measuring approximately 6 mm.  The liver is within normal limits in parenchymal echogenicity and no focal liver lesions are seen.  
 IVC and pancreas were not well visualized due to overlying bowel gas.  There is no evidence of splenomegaly.  Both kidneys are normal in size and appearance.  Abdominal aorta was also obscured by overlying bowel gas.
IMPRESSION: 1.  Previous cholecystectomy. 
 2.  No evidence of biliary dilatation or other acute findings.

## 2008-10-27 IMAGING — CR DG ABDOMEN ACUTE W/ 1V CHEST
4 series · 4 of 4 positions shown · non-contrast
Comparison: none

CLINICAL DATA: Abdominal pain, nausea and diarrhea.  Previous history for small bowel obstruction due to adhesions. 
ACUTE ABDOMINAL SERIES - 3 VIEW:

[w chest pa]
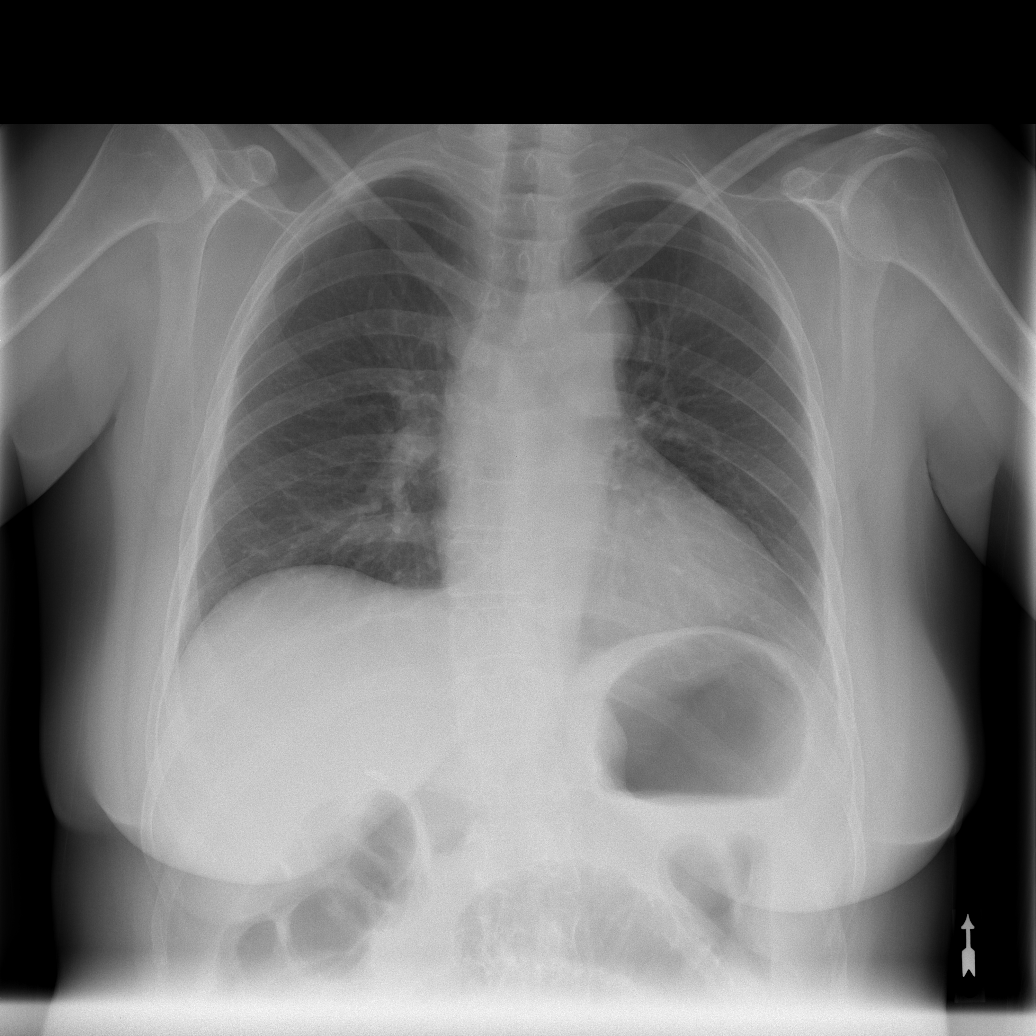

[w abdomen upright *]
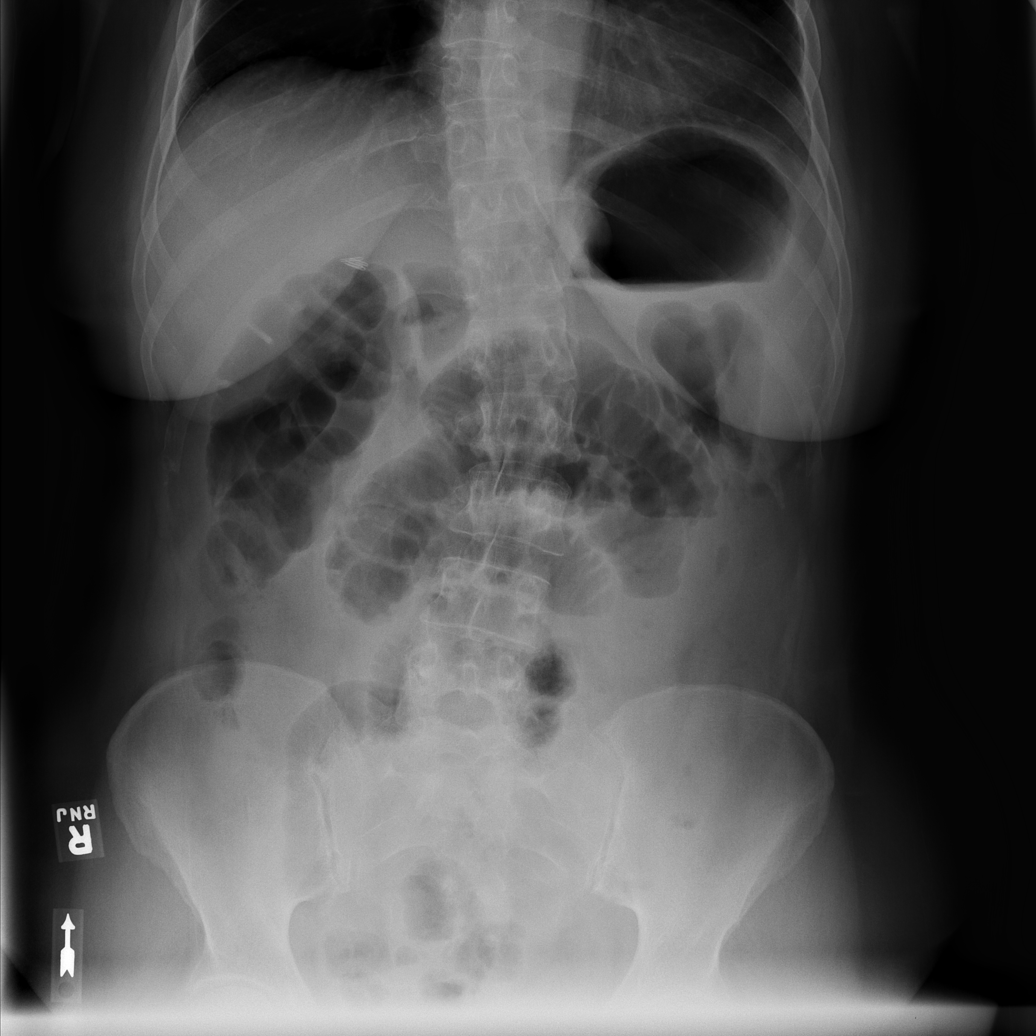

[t abdomen supine (1 of 2)]
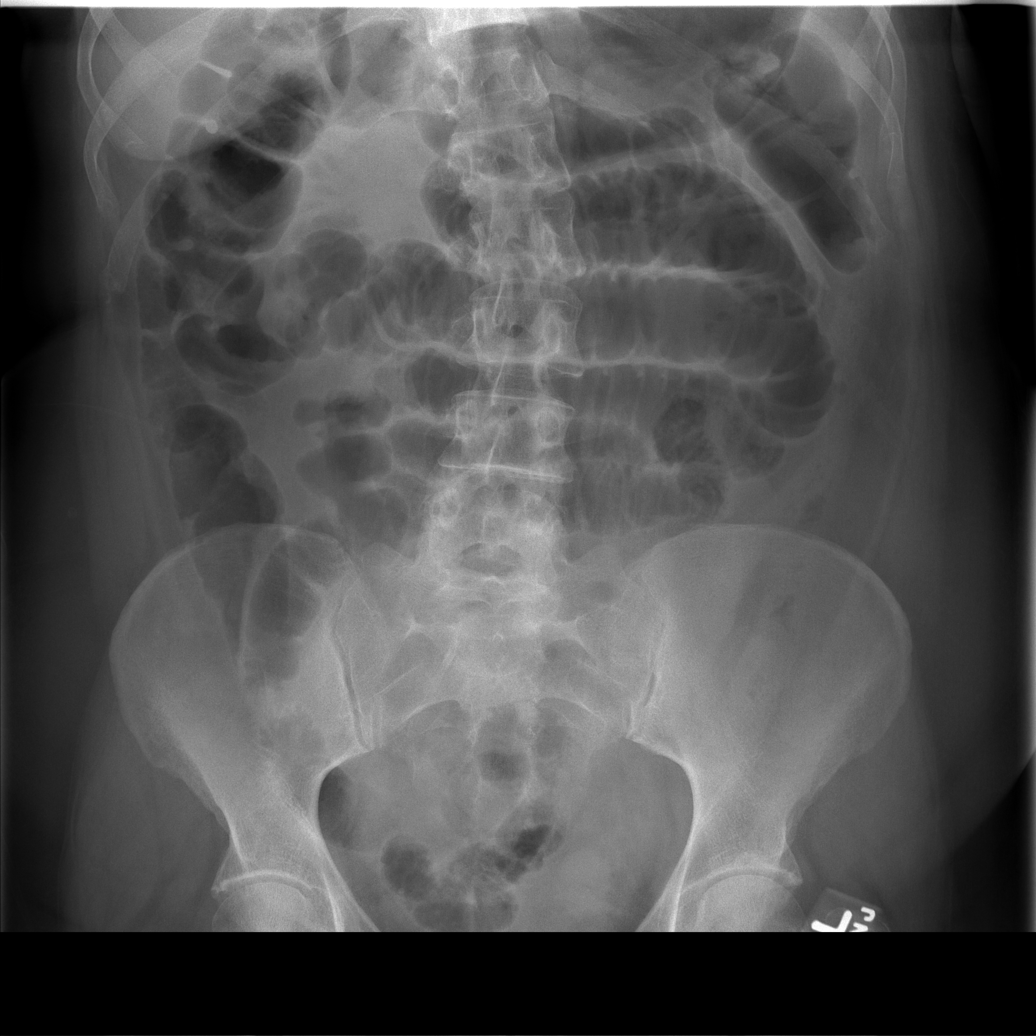

[t abdomen supine (2 of 2)]
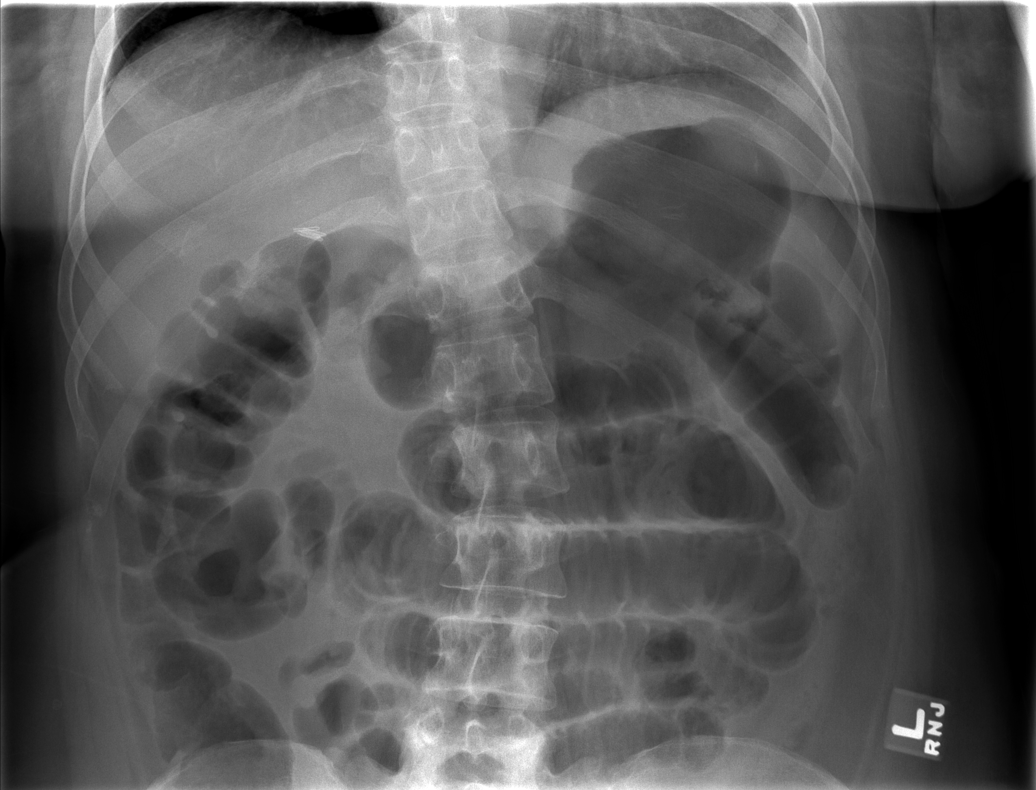

[4 of 4 positions shown; findings below may reference images not displayed]

FINDINGS: Multiple dilated small bowel loops are seen in the mid-abdomen which contain air-fluid levels.  There is also gas scattered throughout the majority of the colon.  This is suspicious for a partial small bowel obstruction.  There is no evidence of free intraperitoneal air.  Surgical clips are seen from prior cholecystectomy.  Heart size and mediastinal contours are normal.  Both lungs are clear.
IMPRESSION: 1.  Findings suspicious for partial small bowel obstruction. 
2.  No active cardiopulmonary disease.

## 2008-10-28 IMAGING — CR DG ABDOMEN 2V
3 series · 3 of 3 positions shown · non-contrast
Comparison: 12/04/06.

CLINICAL DATA: Abdominal pain. 
 ABDOMEN ? 2 VIEW

[w abdomen upright *]
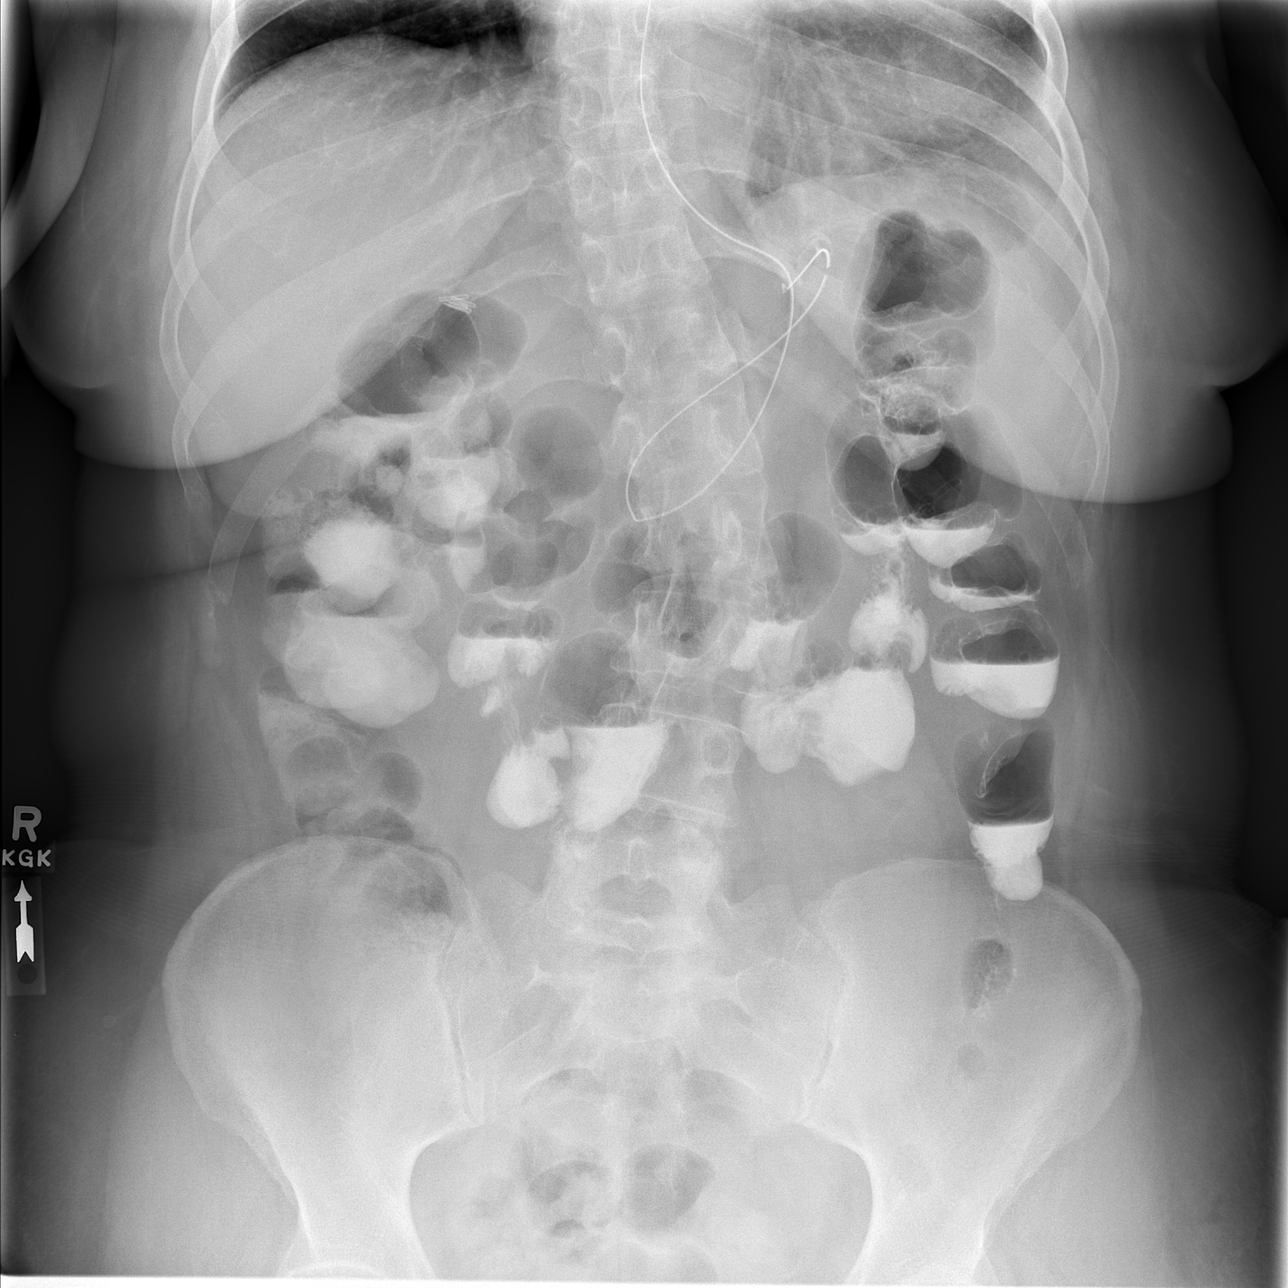

[t abdomen supine (1 of 2)]
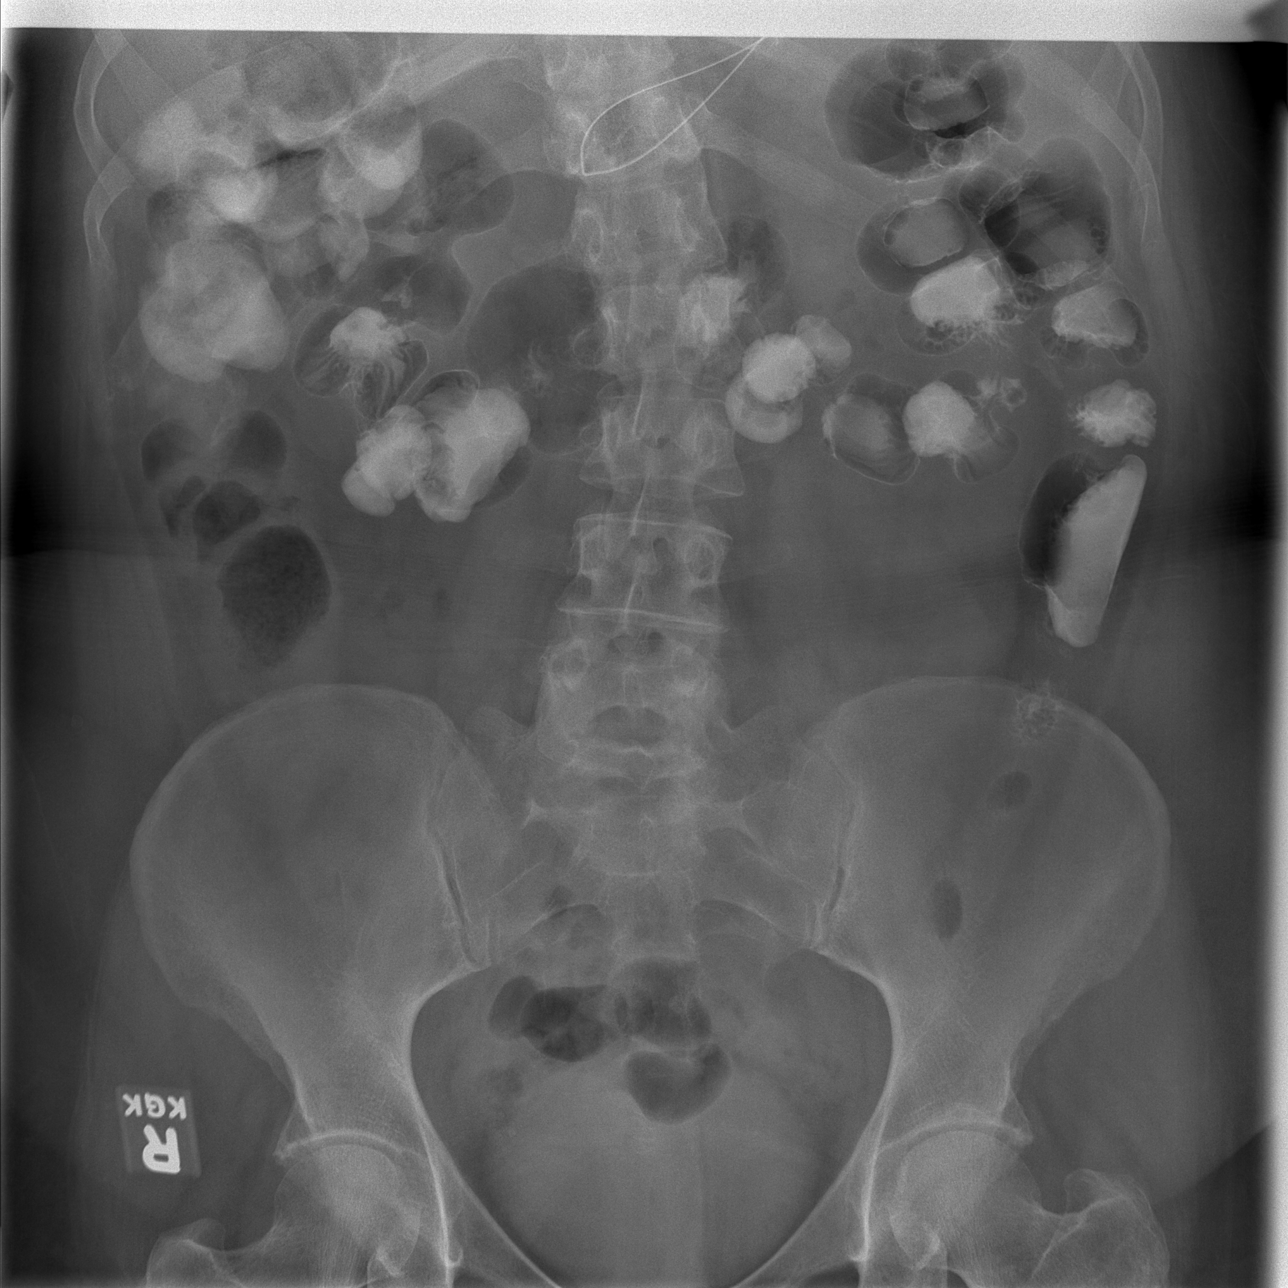

[t abdomen supine (2 of 2)]
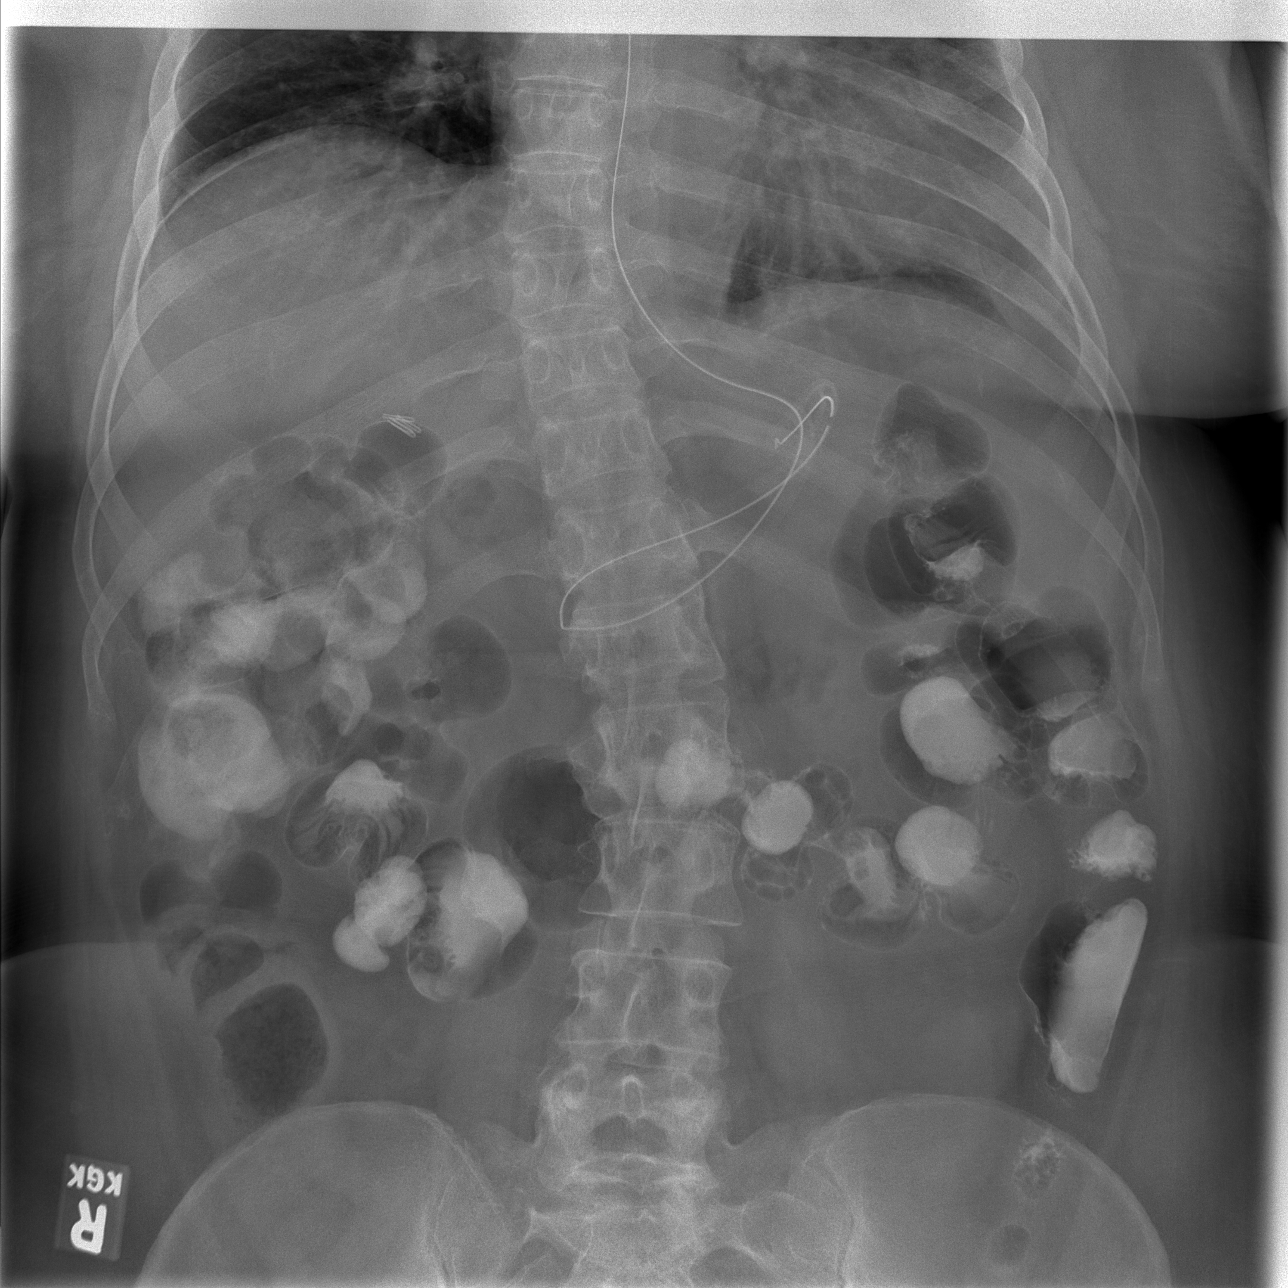

[3 of 3 positions shown; findings below may reference images not displayed]

FINDINGS: Small bowel caliber is now normal with no air fluid levels.   There is contrast scattered throughout the colon, introduced at the time of a CT on 12/04/06. 
 There are short segment air fluid levels in the colon.  NG tube is coiled in the stomach. 
 No free air.  Psoas margins intact.
IMPRESSION: Bowel gas pattern significantly improved.  No longer a suggestion of small bowel obstruction.

## 2008-11-24 ENCOUNTER — Ambulatory Visit: Payer: Self-pay | Admitting: Physician Assistant

## 2008-11-24 ENCOUNTER — Encounter: Payer: Self-pay | Admitting: Family Medicine

## 2008-11-25 ENCOUNTER — Emergency Department: Payer: Self-pay | Admitting: Emergency Medicine

## 2008-12-01 IMAGING — CR DG KNEE 1-2V*R*
1 series · 1 of 1 positions shown · non-contrast
Comparison: none

CLINICAL DATA: Right knee pain and swelling.
 RIGHT KNEE ? 2 VIEW:

[view not recorded]
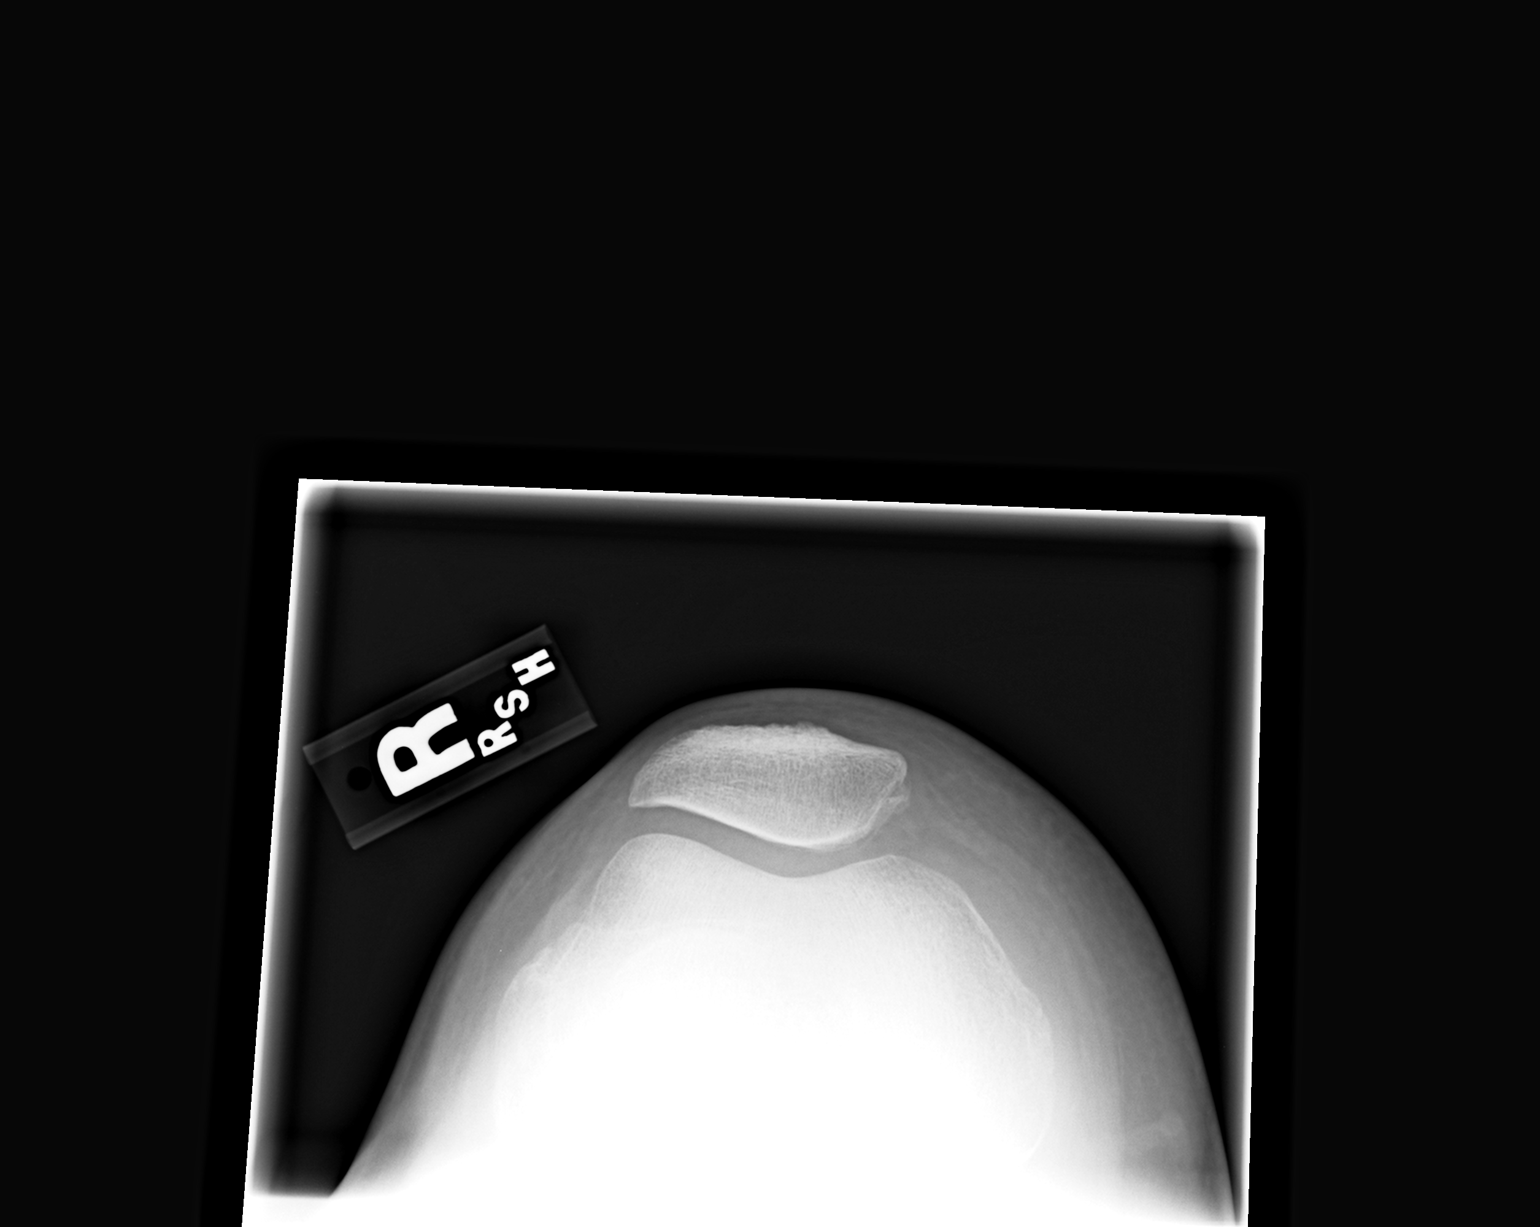

[1 of 1 positions shown; findings below may reference images not displayed]

FINDINGS: No definite joint effusion or fracture.
IMPRESSION: No acute findings.

## 2008-12-14 ENCOUNTER — Inpatient Hospital Stay: Payer: Self-pay | Admitting: Internal Medicine

## 2008-12-29 IMAGING — CR DG ABDOMEN ACUTE W/ 1V CHEST
4 series · 4 of 4 positions shown · non-contrast
Comparison: 12/05/2006 and 02/02/2006

CLINICAL DATA: Abdominal pain and back pain.  
ACUTE ABDOMINAL SERIES WITH CHEST - 3 VIEW:

[w chest pa]
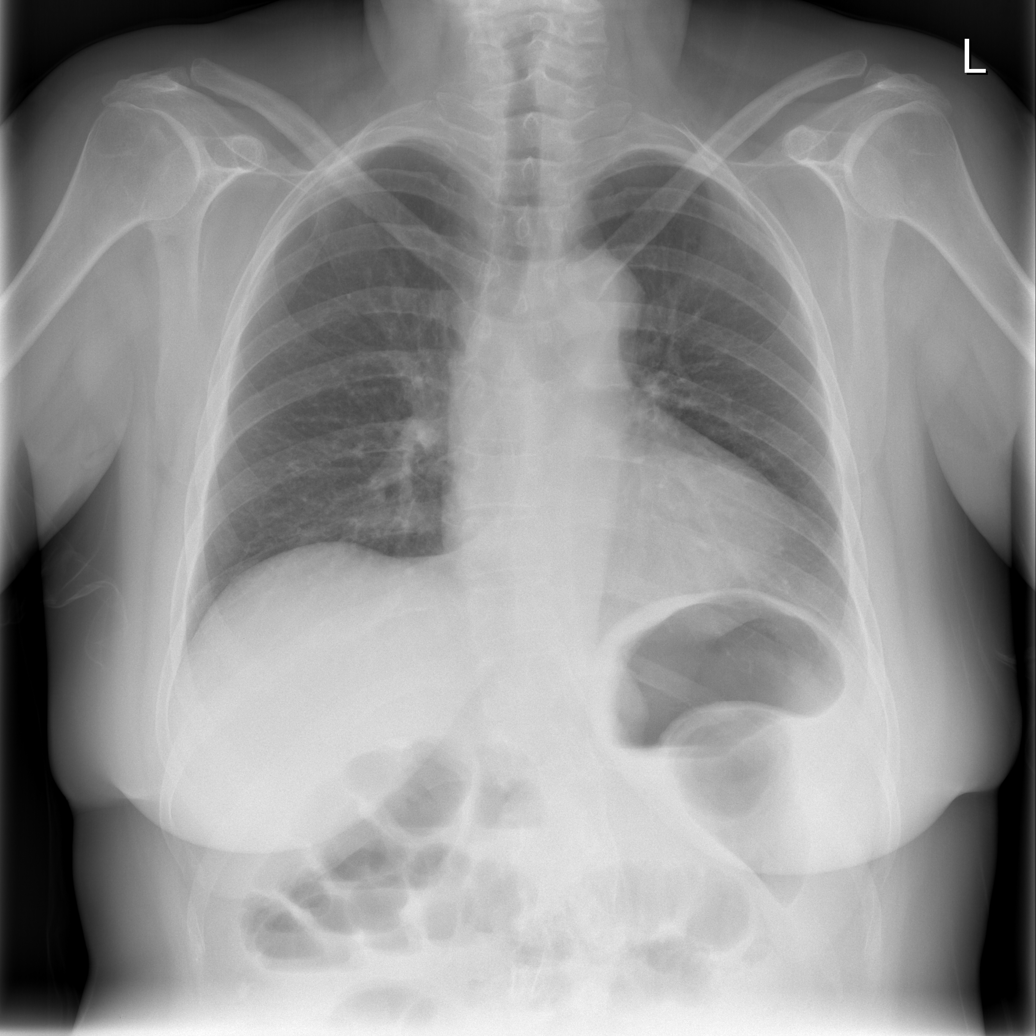

[w abdomen upright *]
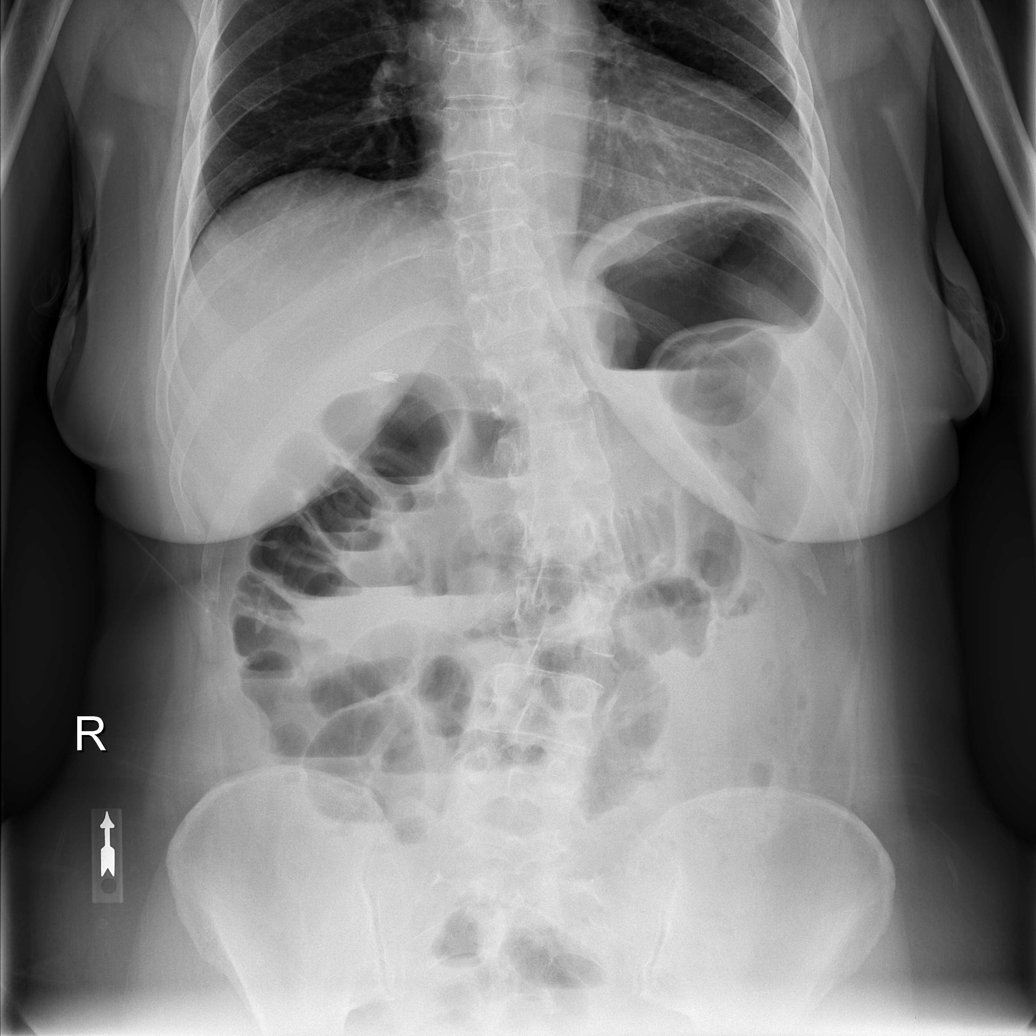

[t abdomen supine (1 of 2)]
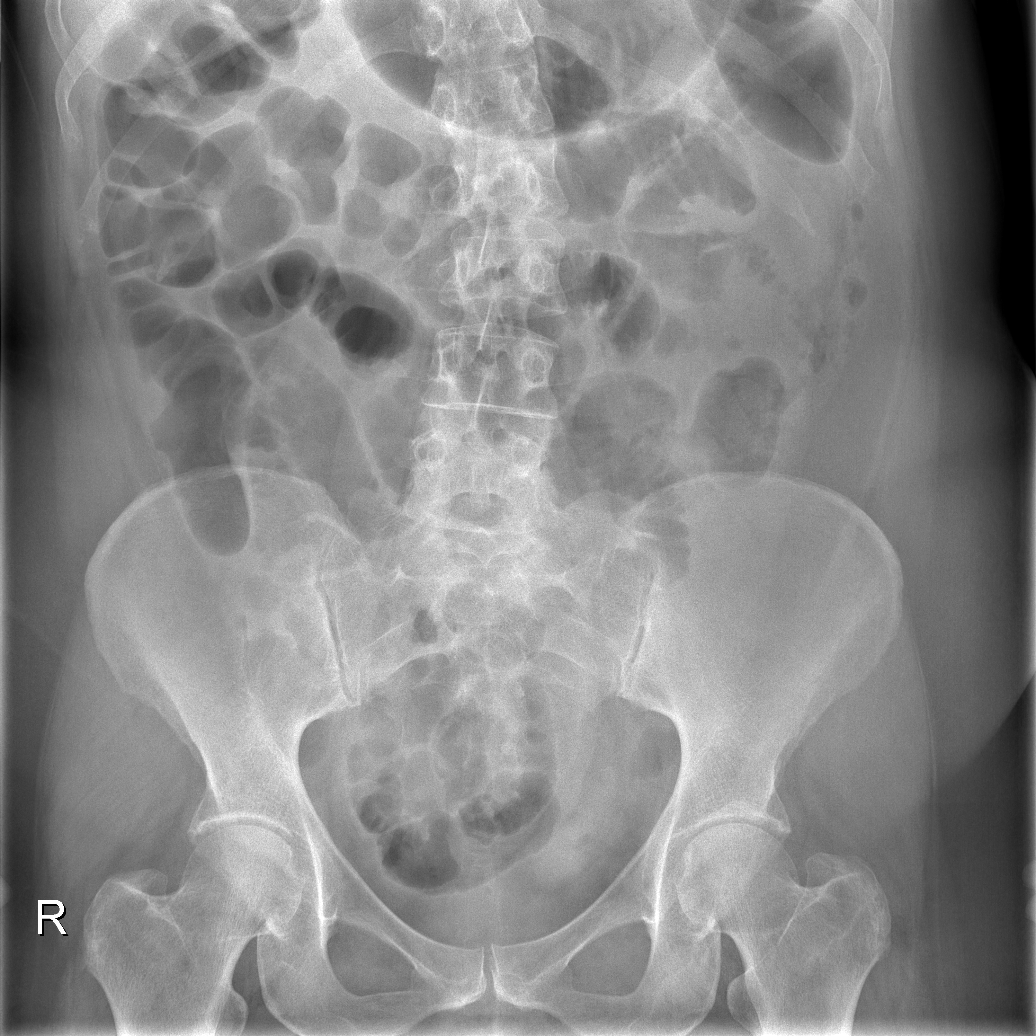

[t abdomen supine (2 of 2)]
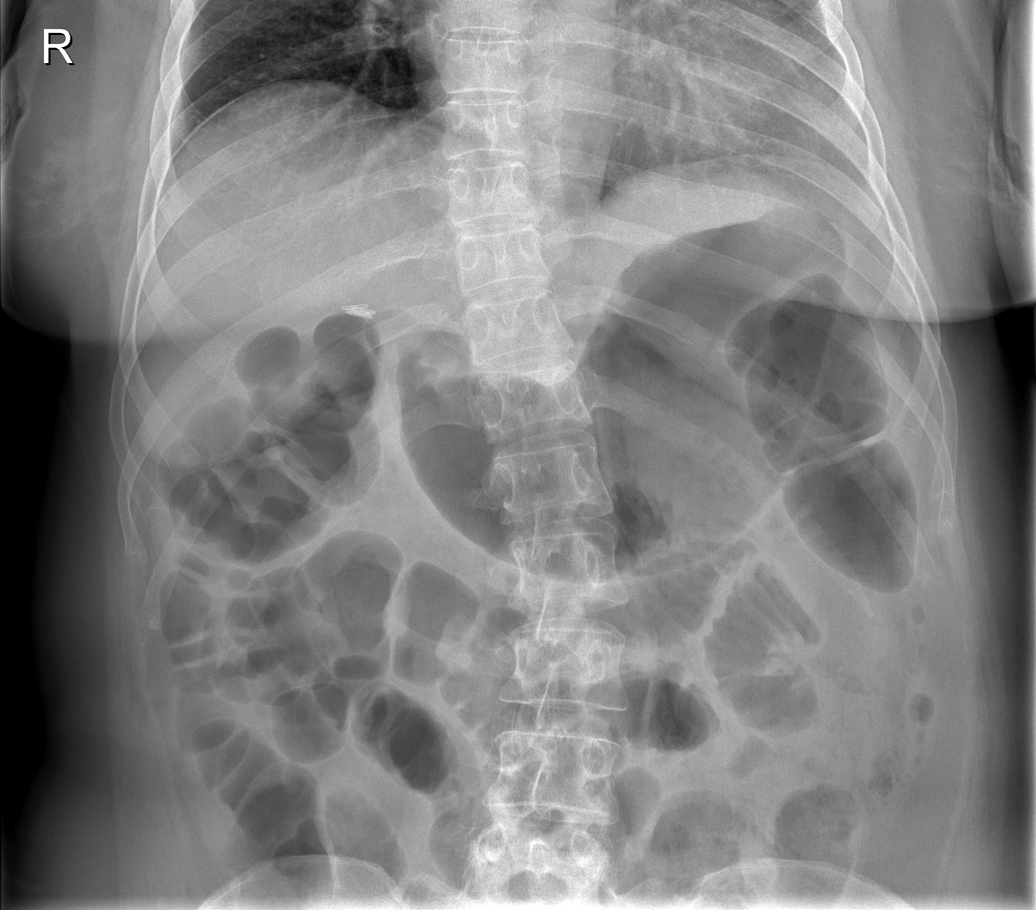

[4 of 4 positions shown; findings below may reference images not displayed]

FINDINGS: The chest radiograph demonstrates stable appearance of the heart and mediastinum.  The lungs are essentially clear without free air. There is gas in the stomach.  The trachea is midline.  The bone structures are intact.  There are cholecystectomy clips in the right upper quadrant.  Patient does have what appear to be dilated loops of small and large bowel.  These findings are nonspecific.  There are some air-fluid levels within the small bowel. Again noted is levoscoliosis of the lumbar spine. 
Because there is gas throughout the stomach, small bowel and large bowel, an ileus pattern cannot be excluded.
IMPRESSION: 1.  No acute cardiopulmonary disease.
2.  Nonspecific bowel gas pattern with some air-fluid levels in the abdomen.  There is gas in both the small and large bowel. Cannot exclude an ileus.

## 2008-12-30 ENCOUNTER — Emergency Department: Payer: Self-pay | Admitting: Emergency Medicine

## 2008-12-30 IMAGING — CT CT ABDOMEN W/ CM
2 of 5 series · 16 of 46 positions shown, 18 images · IV contrast (omnipaque)
Comparison: This is the 7th CT examination of the abdomen and pelvis since January 2006, most recently 12/04/2006.

02/10/07 - DUPLICATE COPY for exam association in [facility] – No change from original report.
CLINICAL DATA: Abdominal pain. Nausea and vomiting. Diarrhea. Mild
 leukocytosis. History of cervical cancer for which the patient underwent total
 hysterectomy.

 CT ABDOMEN AND PELVIS WITH CONTRAST 02/06/2007:
TECHNIQUE: Multidetector helical CT of the abdomen and pelvis was performed
 during bolus administration of intravenous contrast. Oral contrast was given.
 Delayed imaging through the kidneys was performed.
 Contrast: 125 cc Omnipaque 300.

[Series 2: abd_pel 5.0 b40f st · axial · 0.63mm/px · z∈[-209,+206]mm · 13 of 93 slices shown, 15 images]
[im 5/93  soft-tissue]
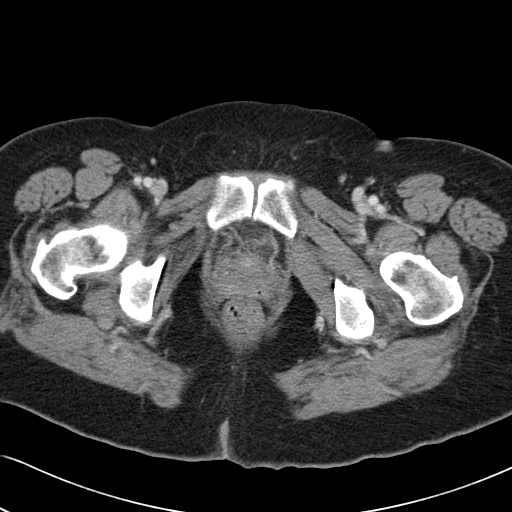
[im 5/93  bone]
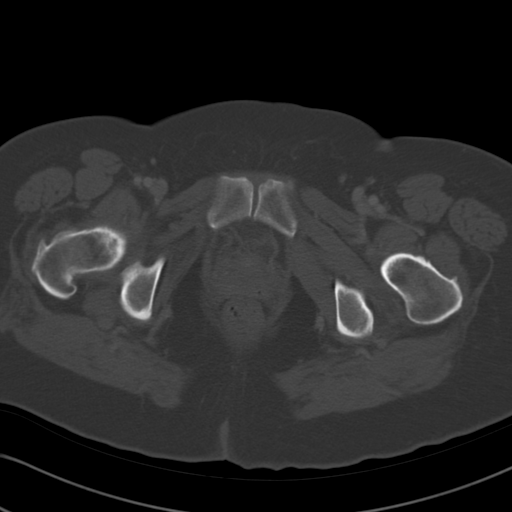
[im 14/93  soft-tissue]
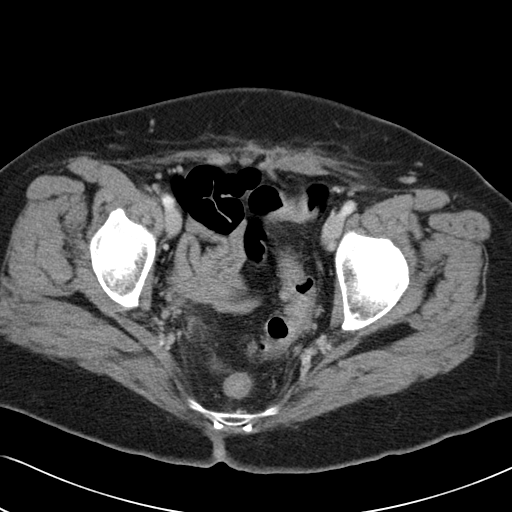
[im 18/93  soft-tissue]
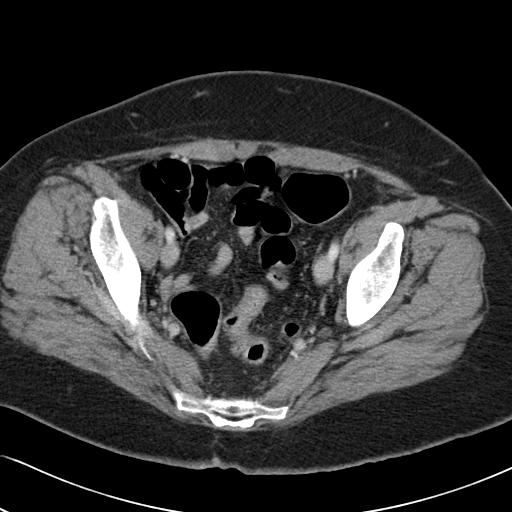
[im 27/93  soft-tissue]
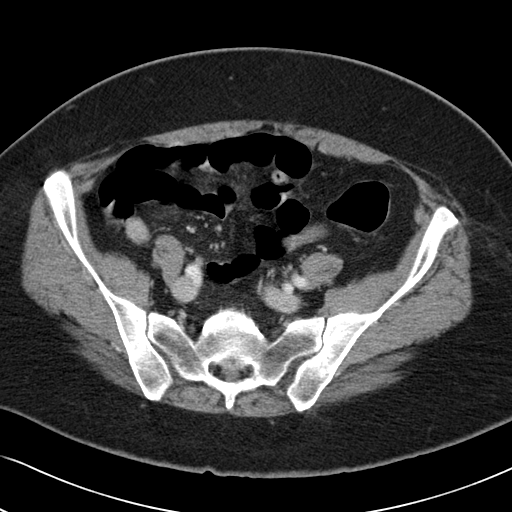
[im 31/93  soft-tissue]
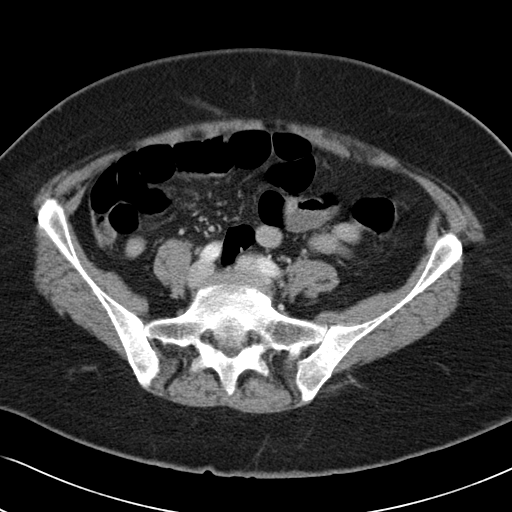
[im 40/93  soft-tissue]
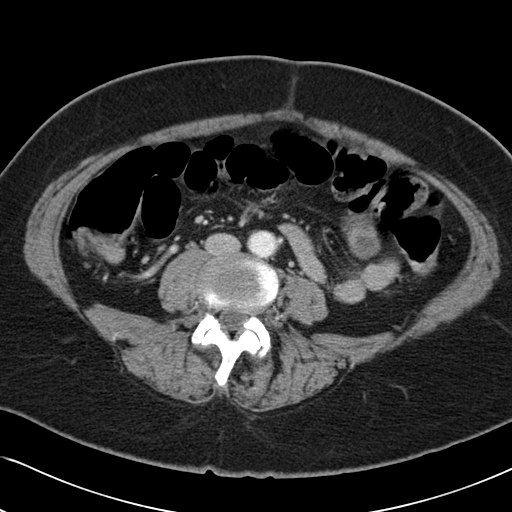
[im 49/93  soft-tissue]
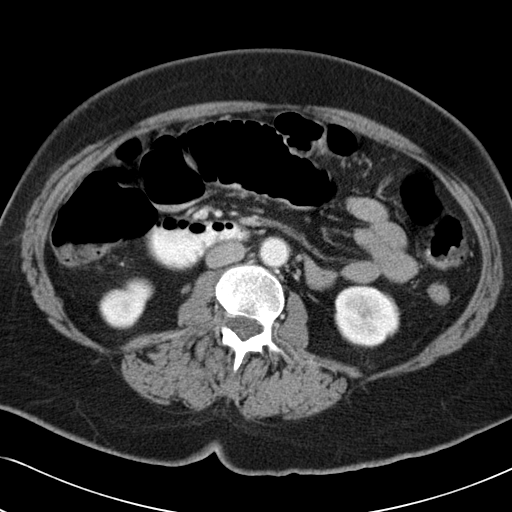
[im 53/93  soft-tissue]
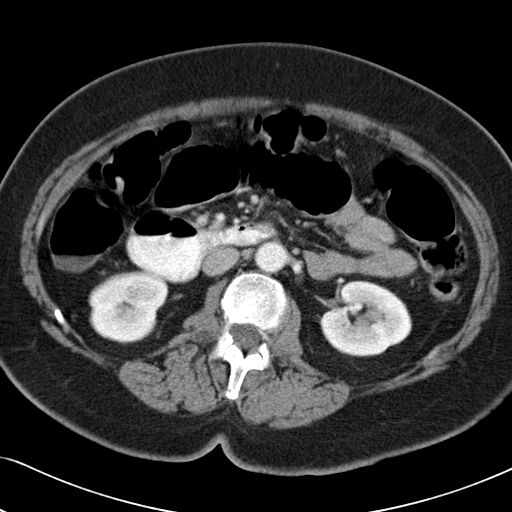
[im 62/93  soft-tissue]
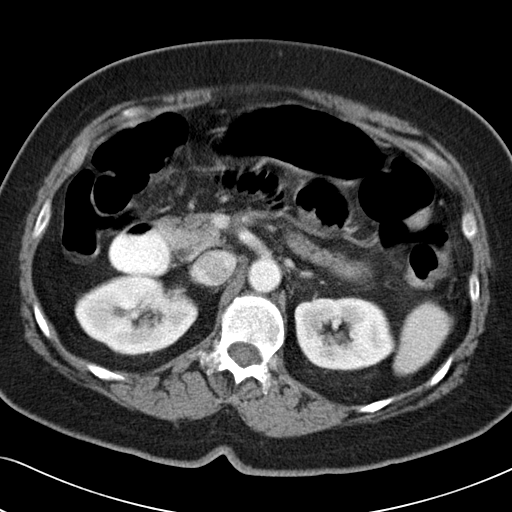
[im 62/93  bone]
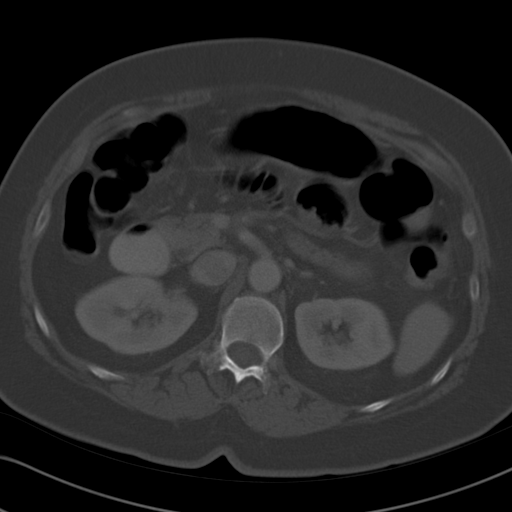
[im 66/93  soft-tissue]
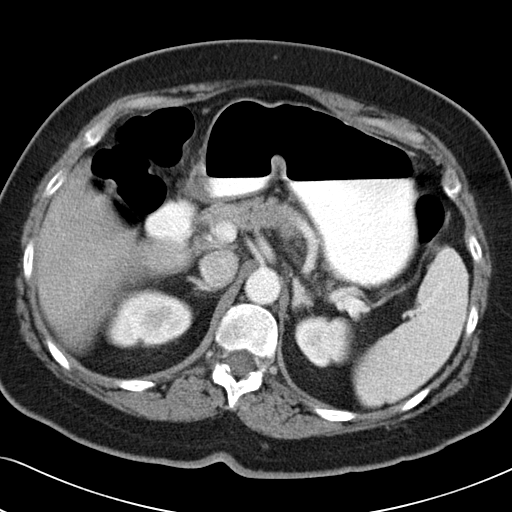
[im 75/93  soft-tissue]
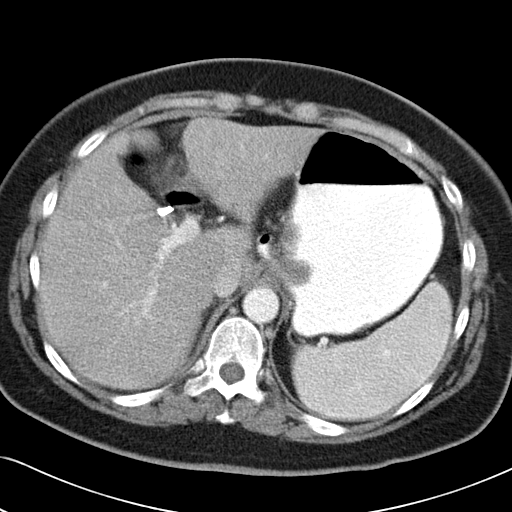
[im 79/93  soft-tissue]
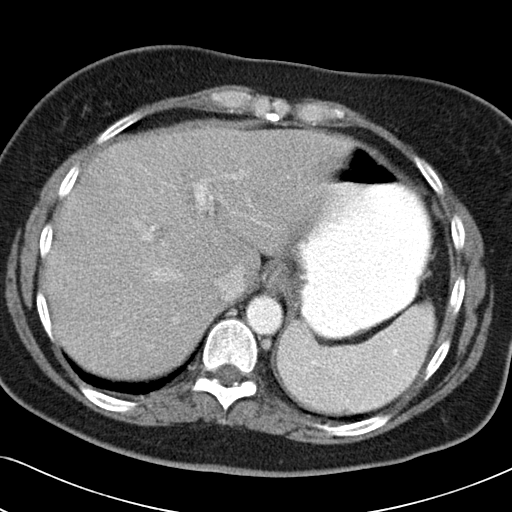
[im 88/93  soft-tissue]
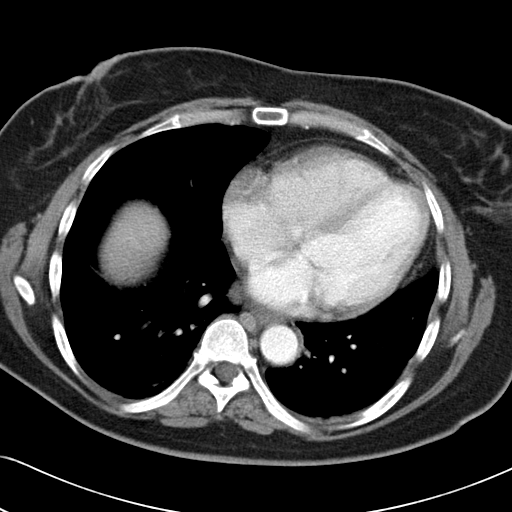

[Series 602: <mpr range> · coronal · 0.94mm/px · 3 of 70 slices shown]
[im 24/70  soft-tissue]
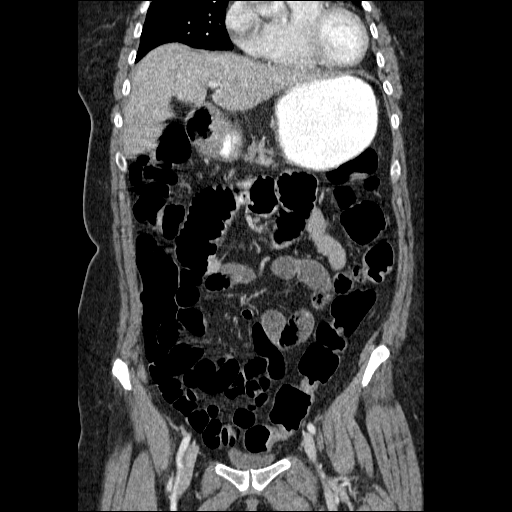
[im 31/70  soft-tissue]
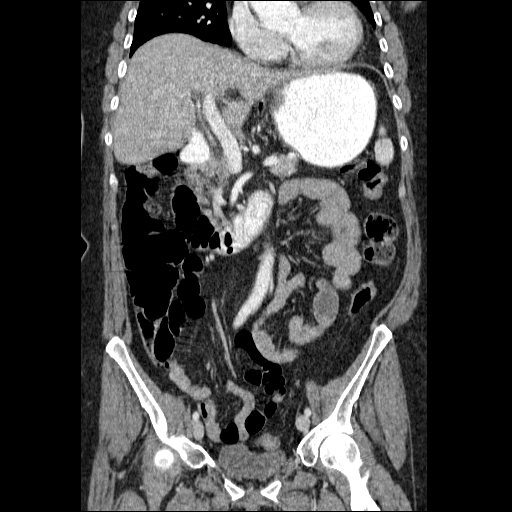
[im 39/70  soft-tissue]
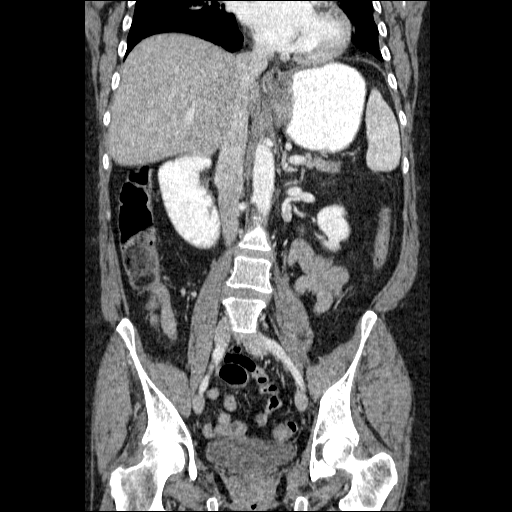

[16 of 46 positions shown; findings below may reference images not displayed]

CT ABDOMEN:

 Mild distention of several loops of jejunum in the upper abdomen with air-fluid
 levels. No free intraperitoneal air. Distal jejunum and ileum of normal caliber.
 Transition point in the left mid abdomen, but no discrete mass or adhesion in
 this location. Visualized portions of the colon unremarkable with the exception
 of scattered descending colonic diverticula. No free fluid.

 Normal appearing liver, spleen, pancreas, and adrenal glands. Gallbladder
 surgically absent. No biliary ductal dilation. Normal kidneys. Mild abdominal
 aortic atherosclerosis without aneurysm. Replaced right hepatic artery to the
 abdominal aorta. Widely patent visceral arteries. No significant
 lymphadenopathy. Minimal atelectasis in the lung bases. Mild lumbar scoliosis
 convex left again noted.
IMPRESSION: 1. Partial small bowel obstruction at the level of the mid to distal jejunum.
 Transition point identified in the left mid abdomen without a discrete mass or
 adhesion. No free intraperitoneal air or free fluid.
 2. No acute abnormalities otherwise.
 3. Descending colon diverticulosis.

 CT PELVIS:

 Normal-caliber small bowel in the pelvis. Scattered sigmoid colon diverticula
 without evidence of diverticulitis. Uterus surgically absent. No adnexal masses
 or free fluid. Urinary bladder unremarkable. No significant lymphadenopathy.
 Mild iliofemoral atherosclerosis without aneurysm.
IMPRESSION: 1. No acute abnormalities in the pelvis.
 2. Sigmoid diverticulosis.

## 2008-12-31 ENCOUNTER — Inpatient Hospital Stay: Payer: Self-pay | Admitting: Internal Medicine

## 2008-12-31 IMAGING — CR DG ABDOMEN 2V
2 series · 2 of 2 positions shown · non-contrast
Comparison: Abdominal CT 02/06/07 and abdominal radiographs 02/05/07.

CLINICAL DATA: Upper abdominal pain and diarrhea. Follow-up obstruction. 
 ABDOMEN - 2 VIEW:

[w abdomen upright *]
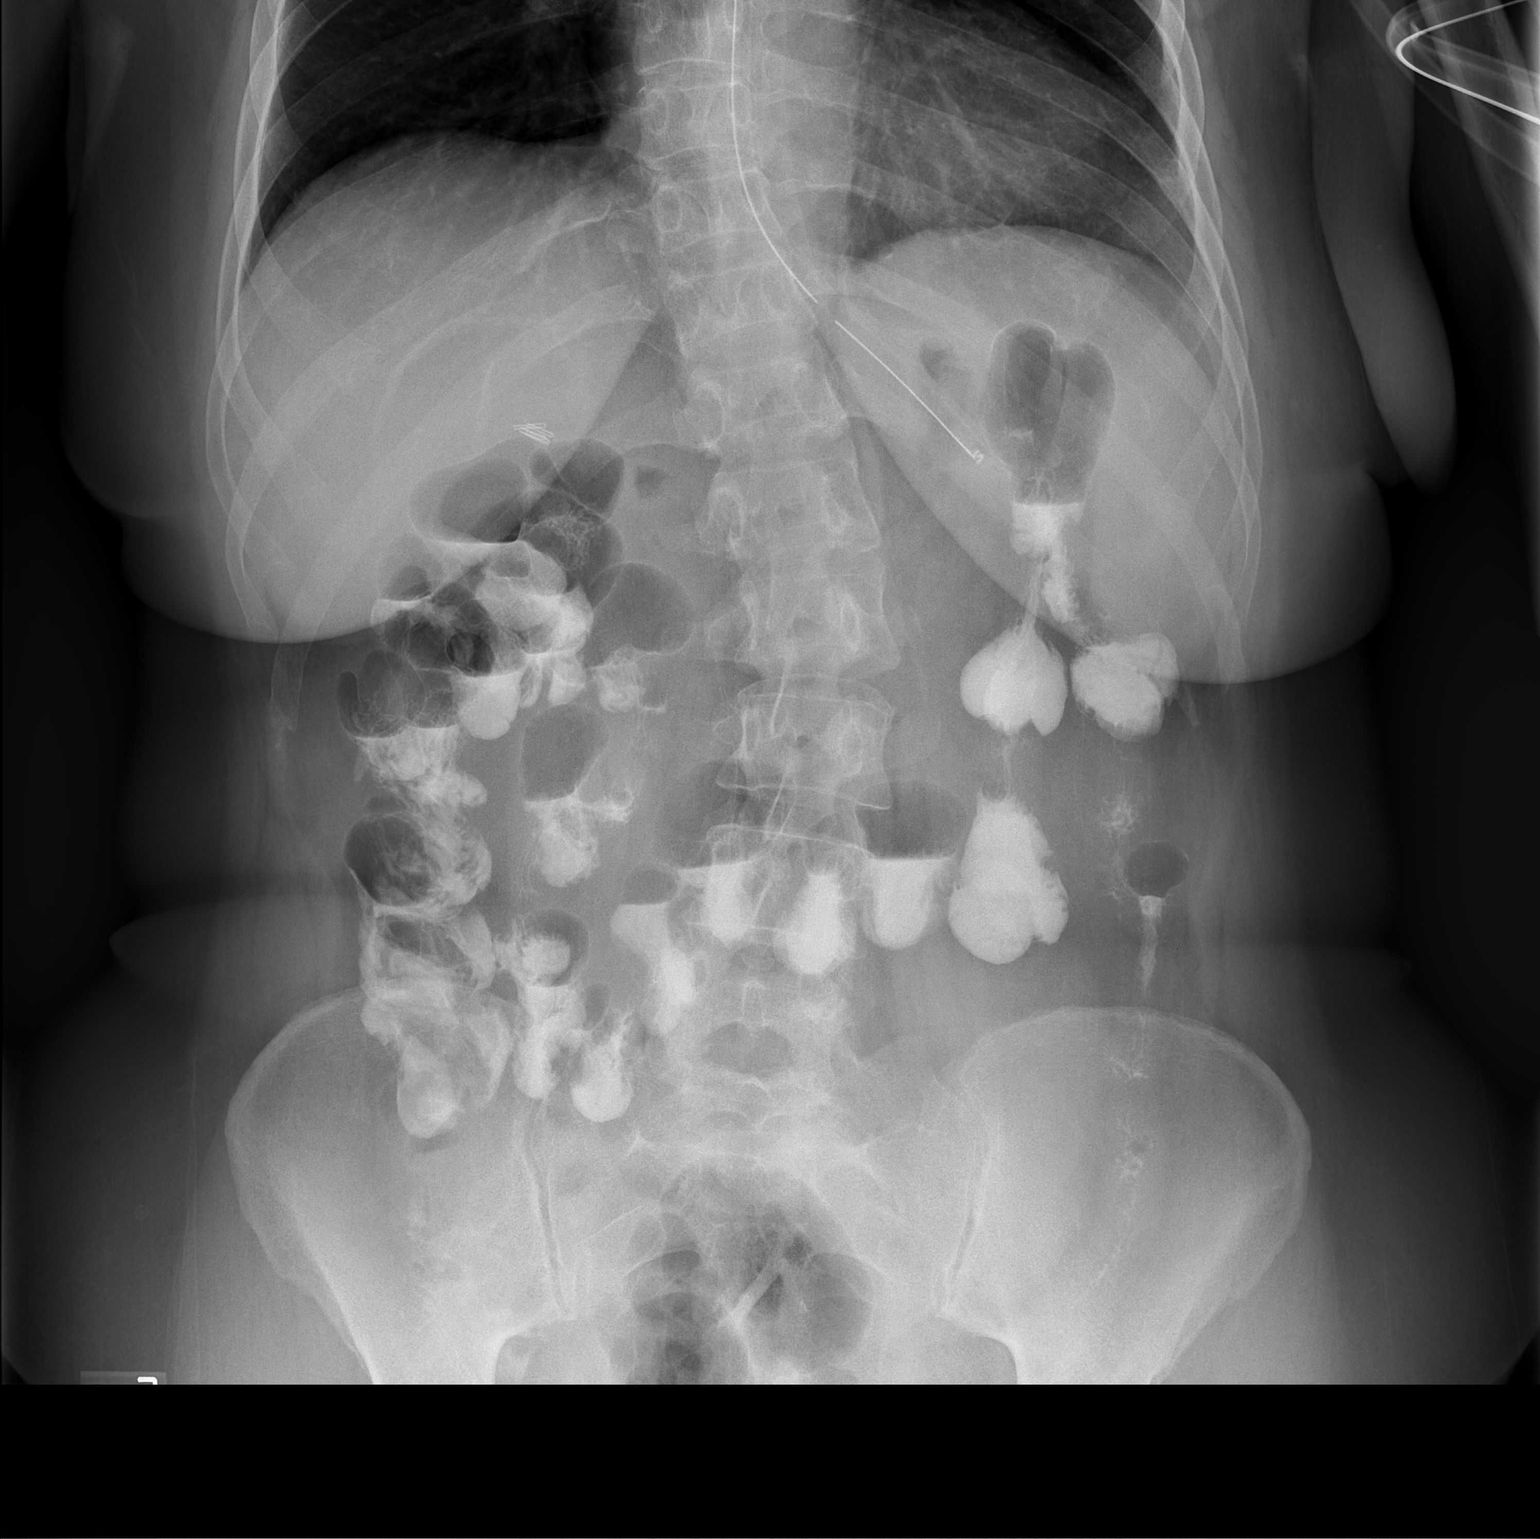

[t abdomen supine]
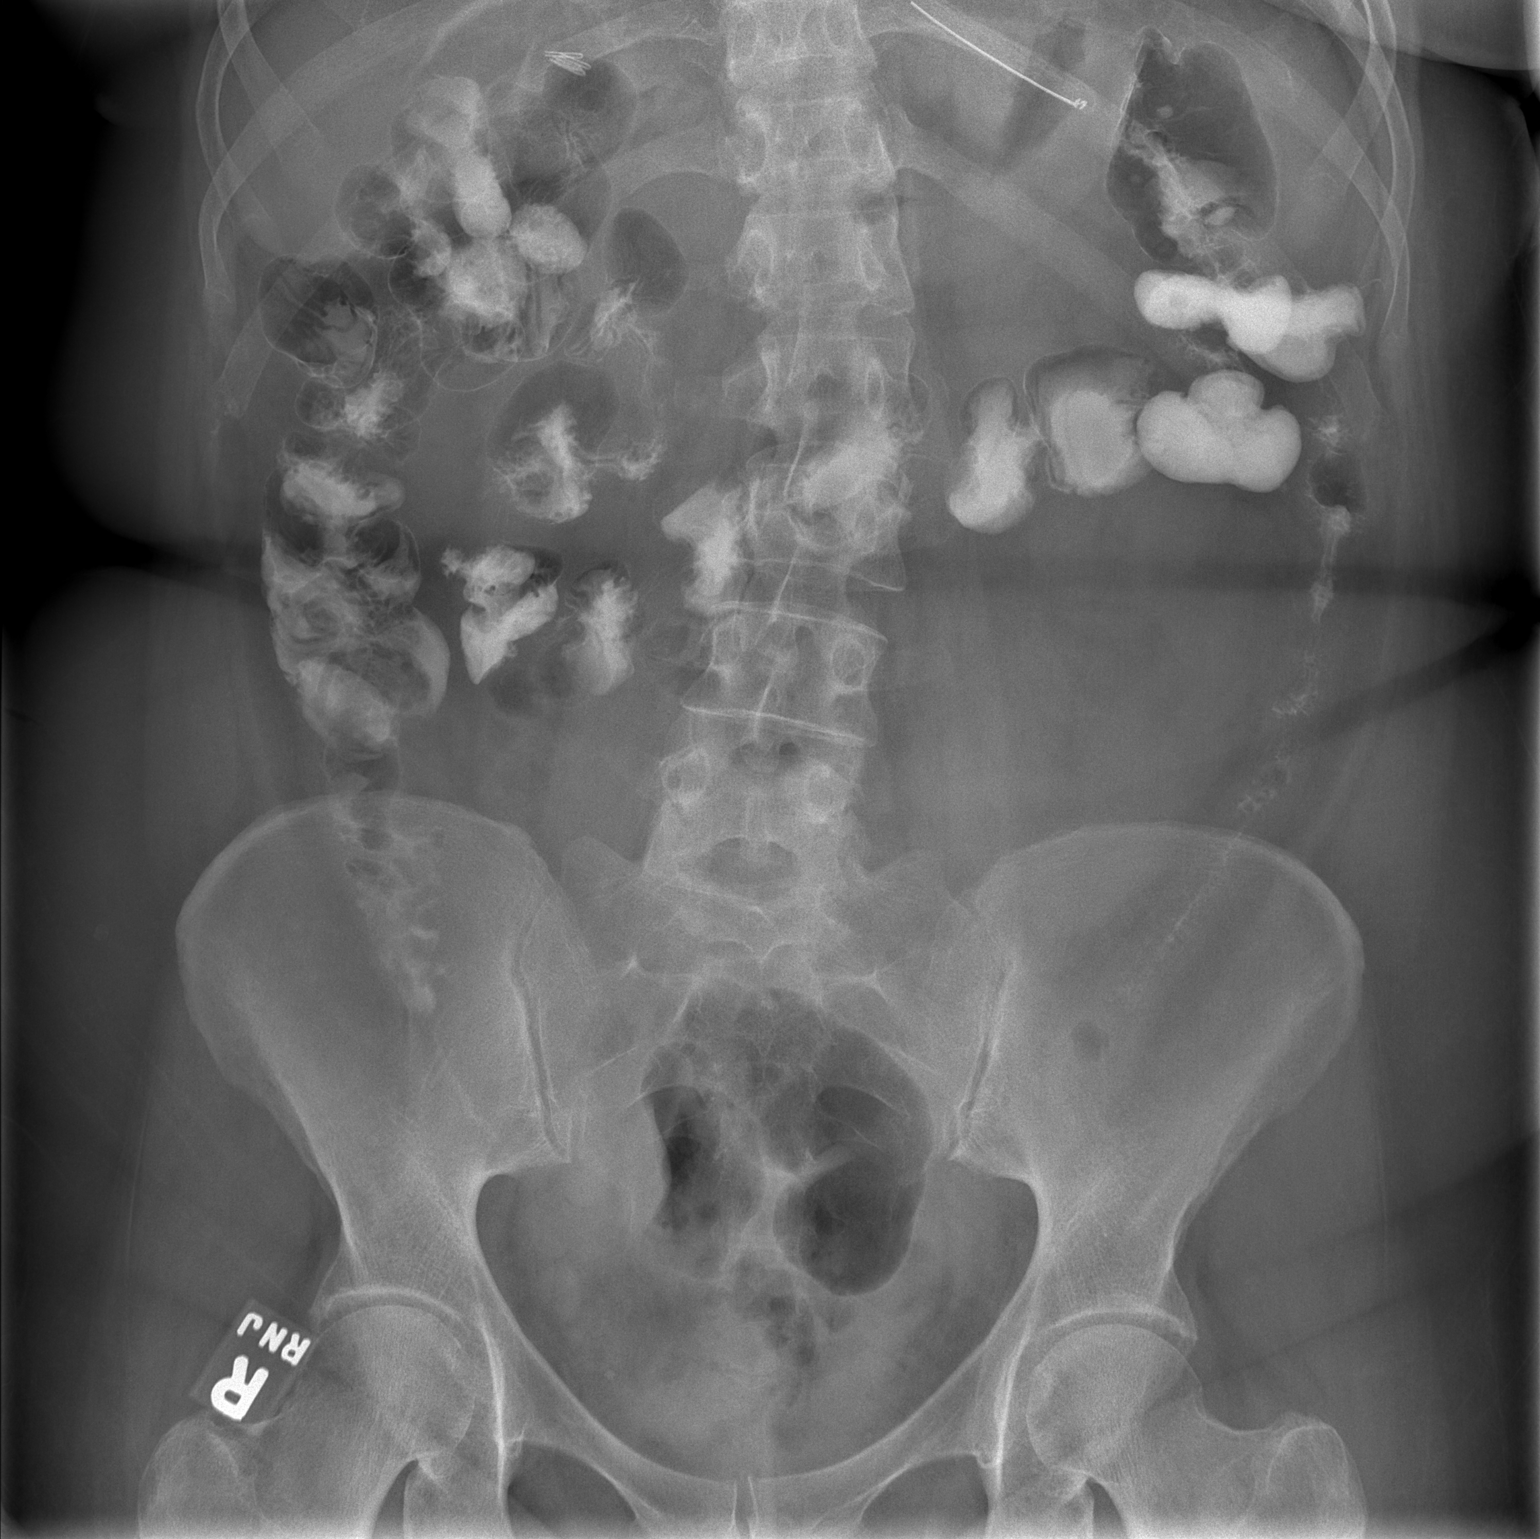

[2 of 2 positions shown; findings below may reference images not displayed]

FINDINGS: There is a nasogastric tube in the proximal stomach.  The contrast administered for yesterday?s CT has passed into the colon, which is decompressed. There is little air in the small bowel. No residual dilated loops of small bowel are identified. There is no free intraperitoneal air or suspicious abdominal calcification.
IMPRESSION: No residual small bowel distention is apparent. The contrast has passed into the colon.

## 2009-01-01 IMAGING — CR DG ABD PORTABLE 1V
1 series · 1 of 1 positions shown · non-contrast
Comparison: 02/07/07.

CLINICAL DATA: Bowel obstruction.
 PORTABLE ABDOMEN ? 1 VIEW:

[view not recorded]
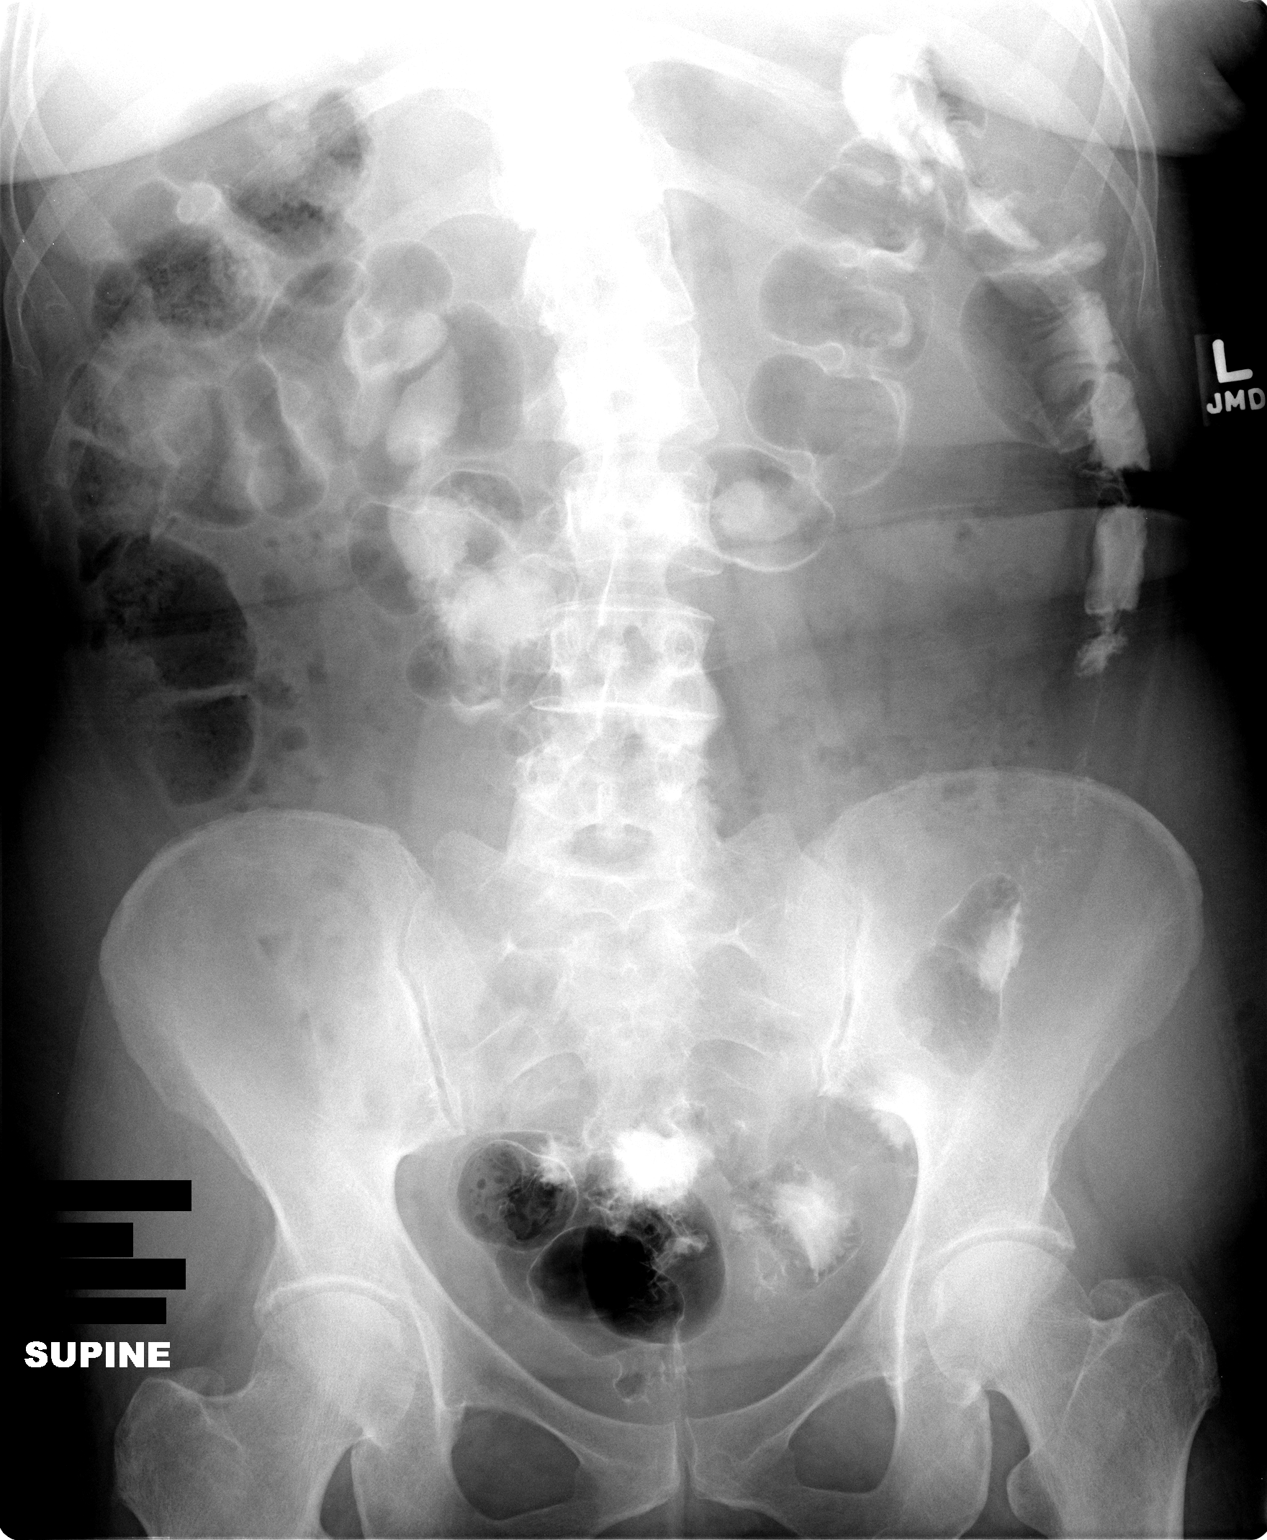

[1 of 1 positions shown; findings below may reference images not displayed]

FINDINGS: No gas filled or dilated loops of small bowel are identified.  Contrast material is again seen in the colon and demonstrates some interval progression through the colon to the rectum.  No new abnormality.
IMPRESSION: Continued passage of contrast through the colon.  No evidence of small bowel obstruction.

## 2009-01-04 IMAGING — CR DG ABDOMEN ACUTE W/ 1V CHEST
4 series · 4 of 4 positions shown · non-contrast
Comparison: 02/08/2007

CLINICAL DATA: Abdominal pain, nausea and vomiting. 
 ACUTE ABDOMINAL SERIES WITH CHEST - 3 VIEW:

[t abdomen supine (1 of 2)]
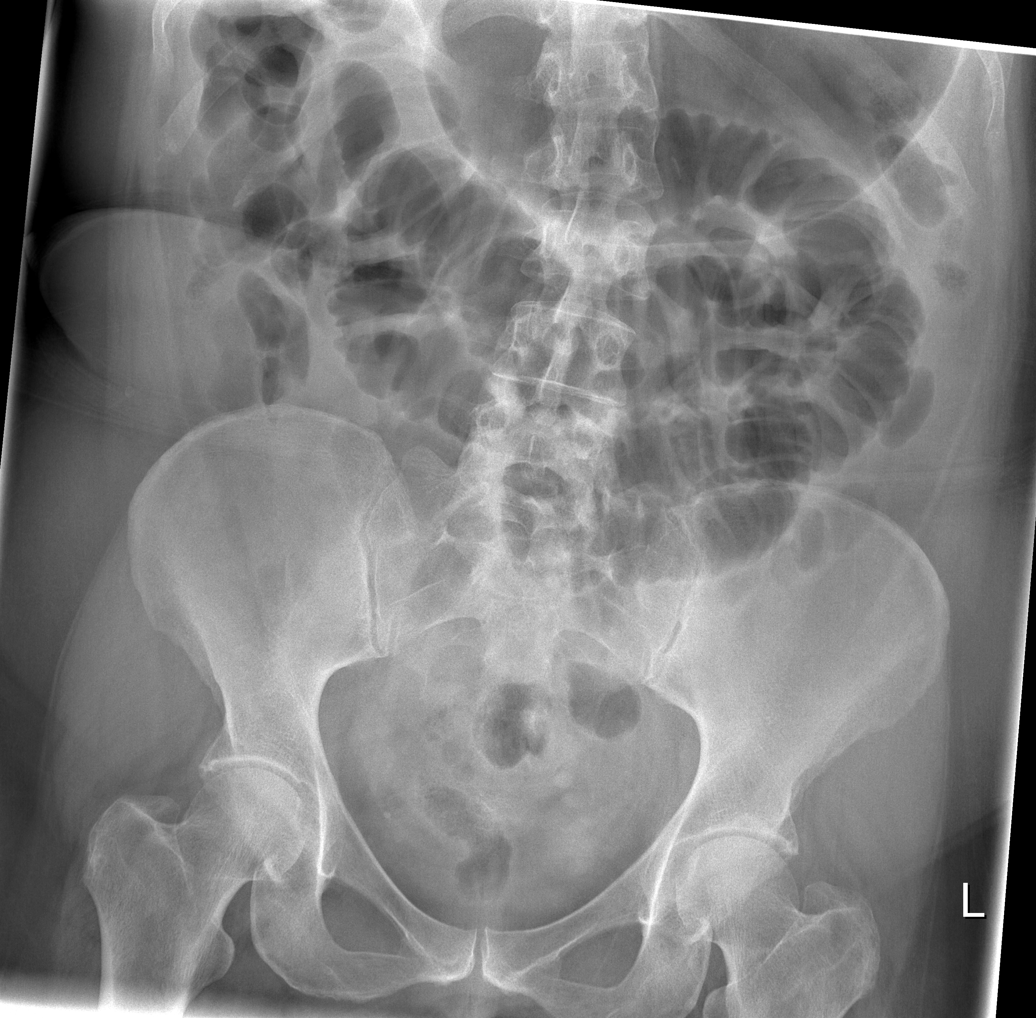

[t abdomen supine (2 of 2)]
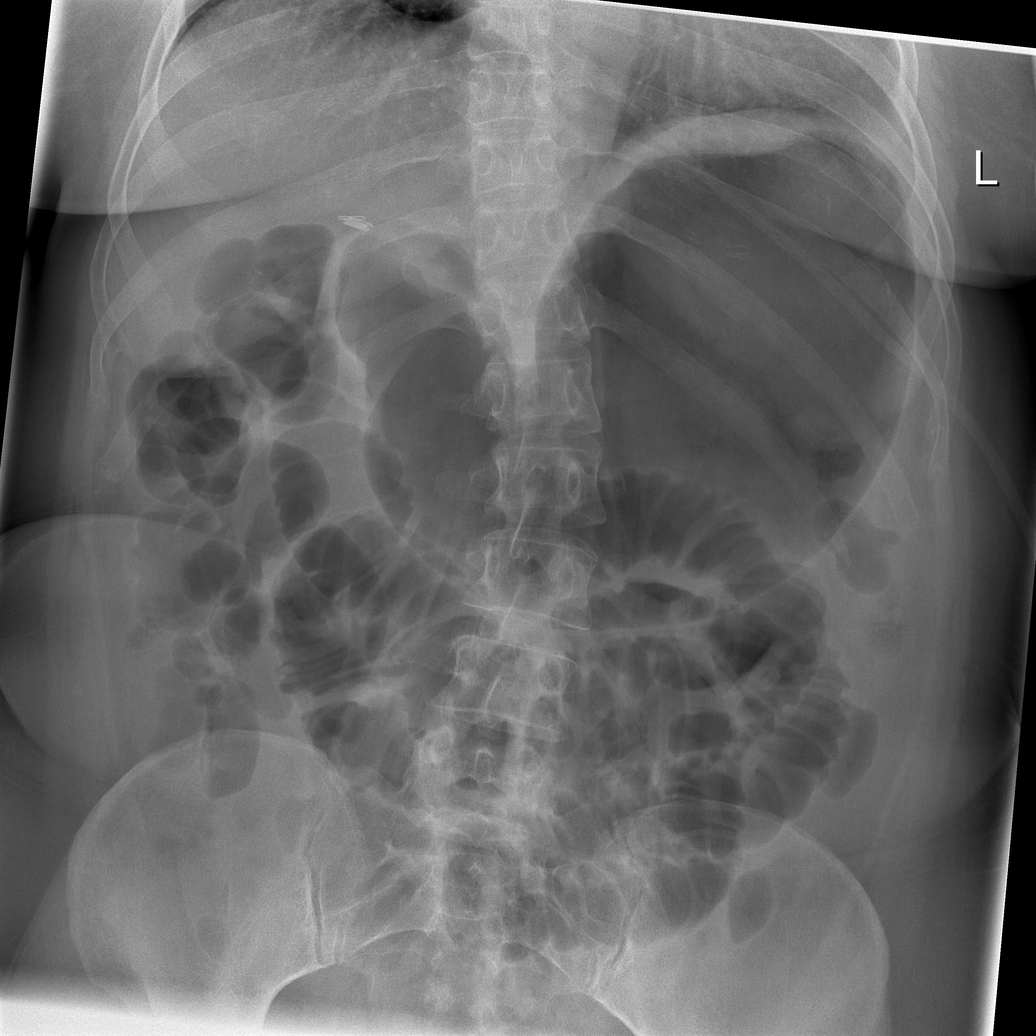

[view not recorded (1 of 2)]
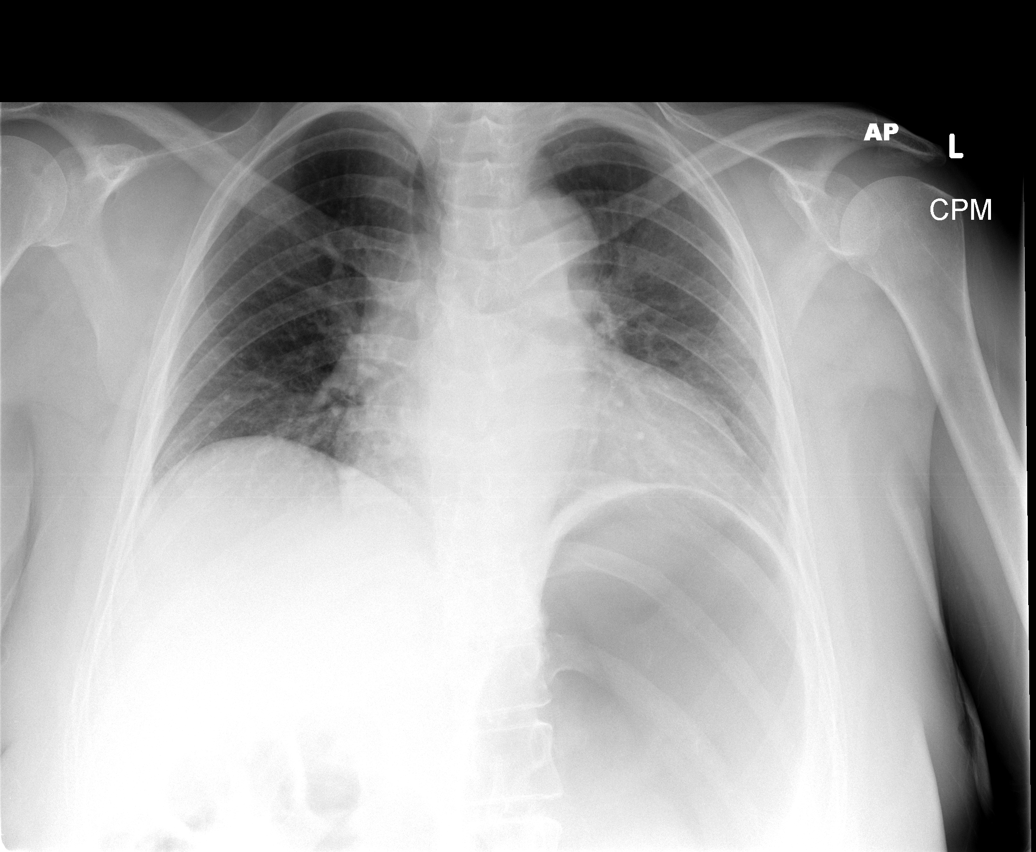

[view not recorded (2 of 2)]
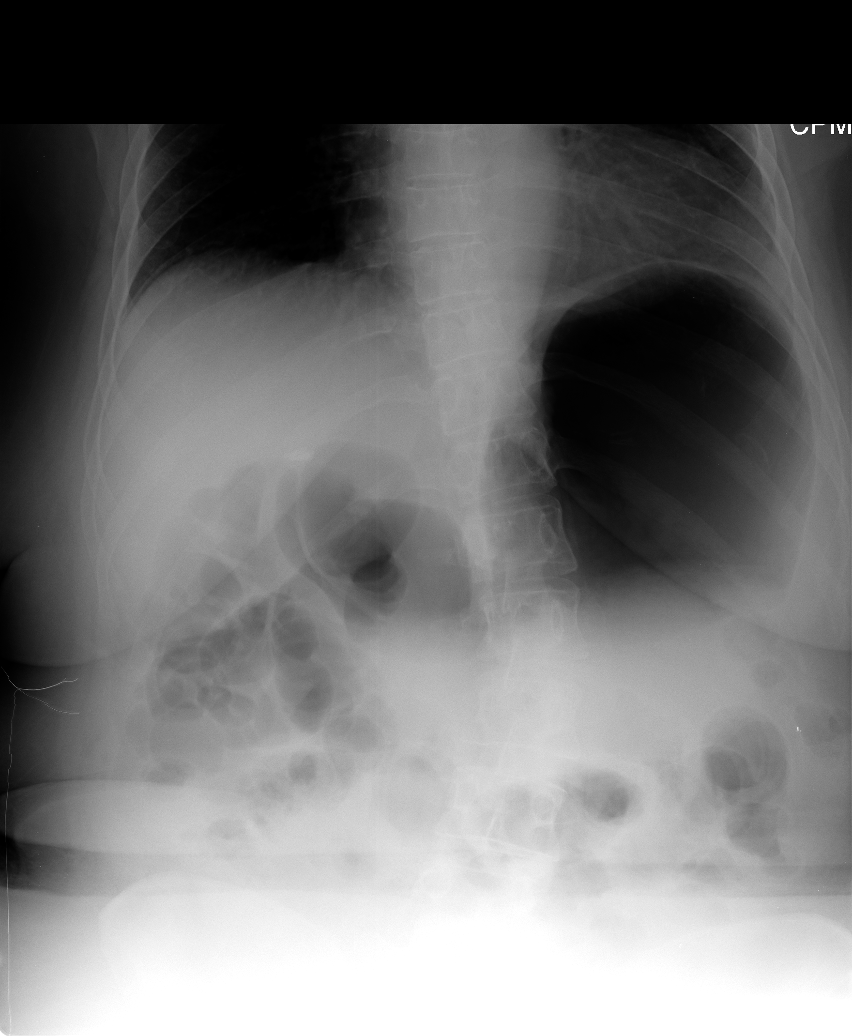

[4 of 4 positions shown; findings below may reference images not displayed]

FINDINGS: There is marked gastric distention.  Distended loops of small bowel with bowel wall thickening.  There is an overall paucity of bowel gas distally.  No evidence for free air.  Surgical clips right upper quadrant secondary to previous cholecystectomy.
IMPRESSION: Small bowel obstruction with gastric distention.  Close followup suggested.

## 2009-01-05 IMAGING — CR DG ABDOMEN 2V
2 series · 2 of 2 positions shown · non-contrast
Comparison: Yesterday?s radiographs.

CLINICAL DATA: Follow-up SBO. Abdominal pain.
 ABDOMEN ? 2 VIEW:

[w abdomen upright]
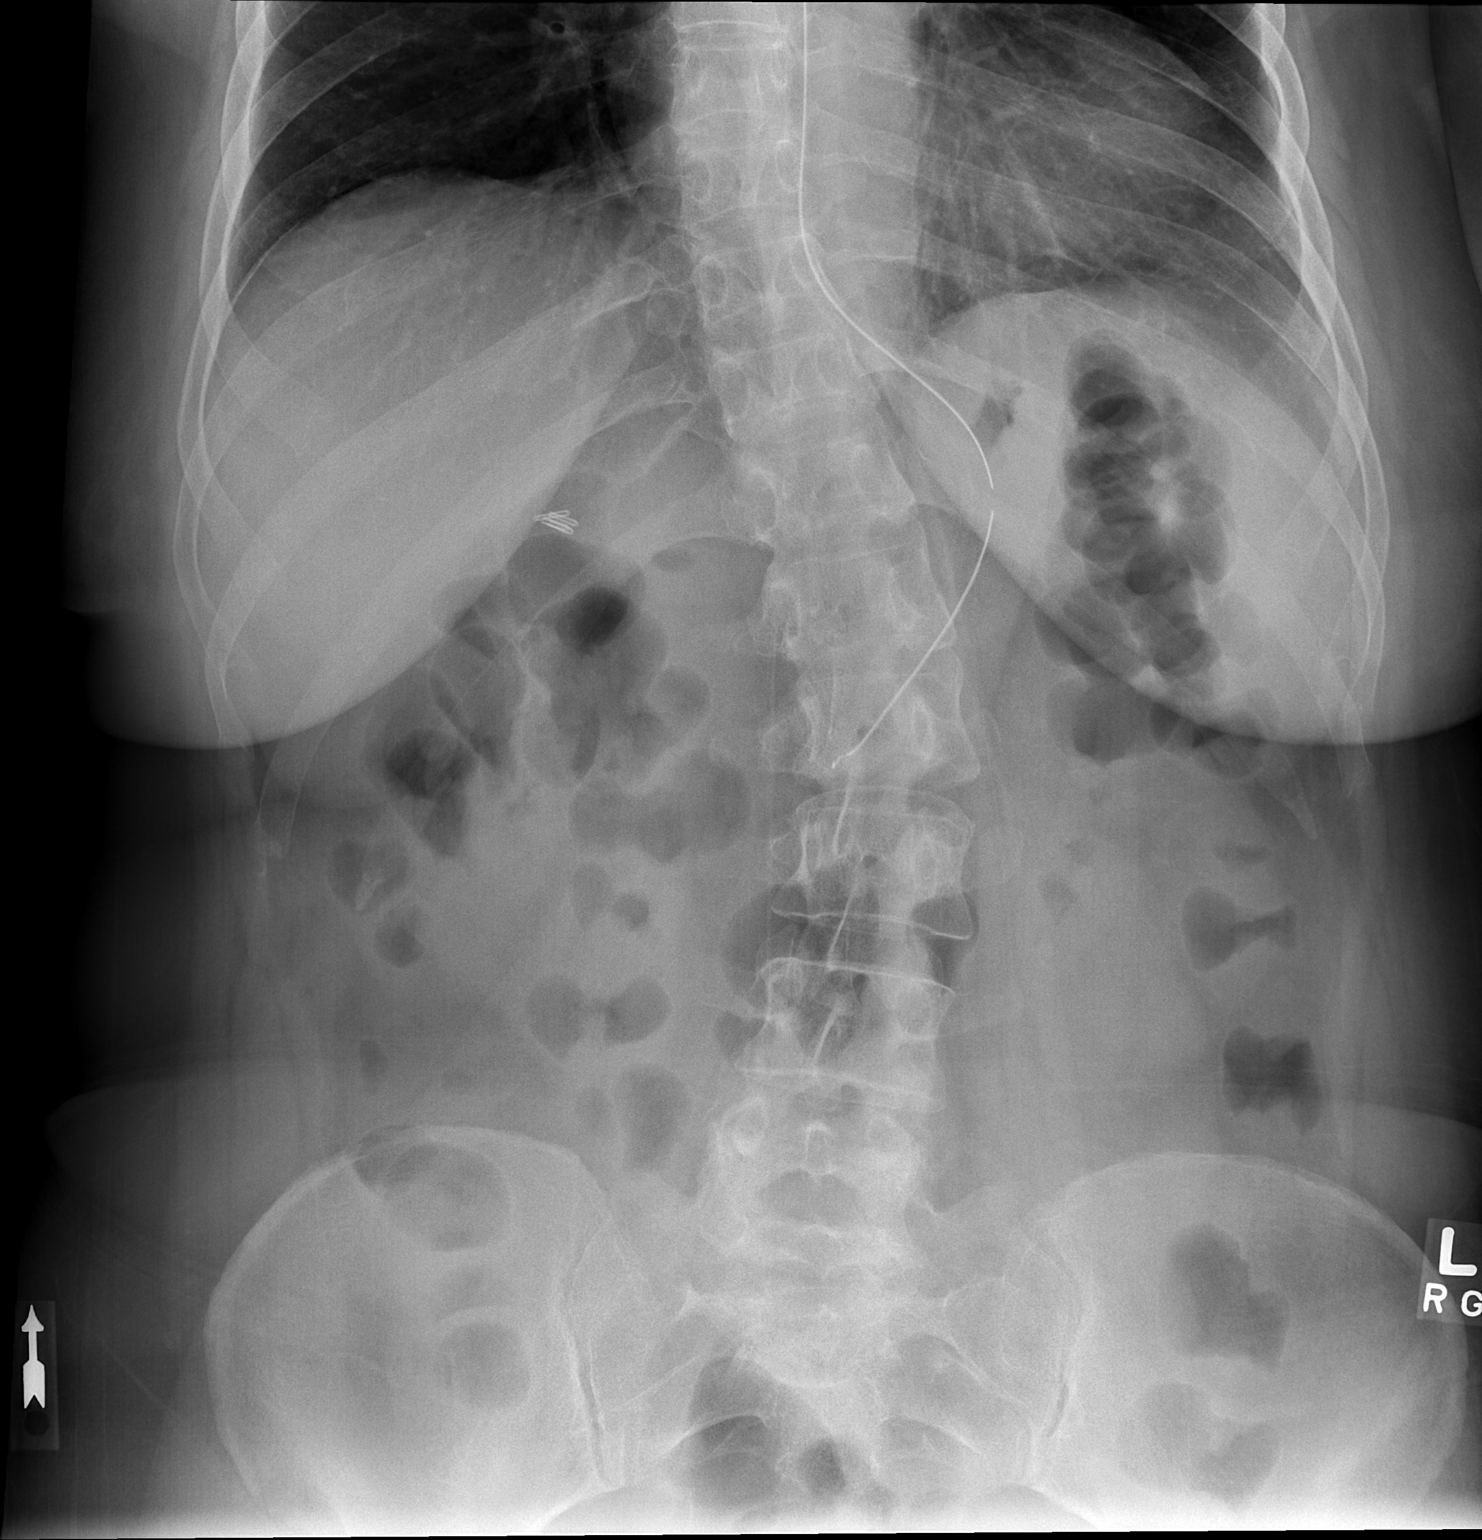

[t abdomen supine]
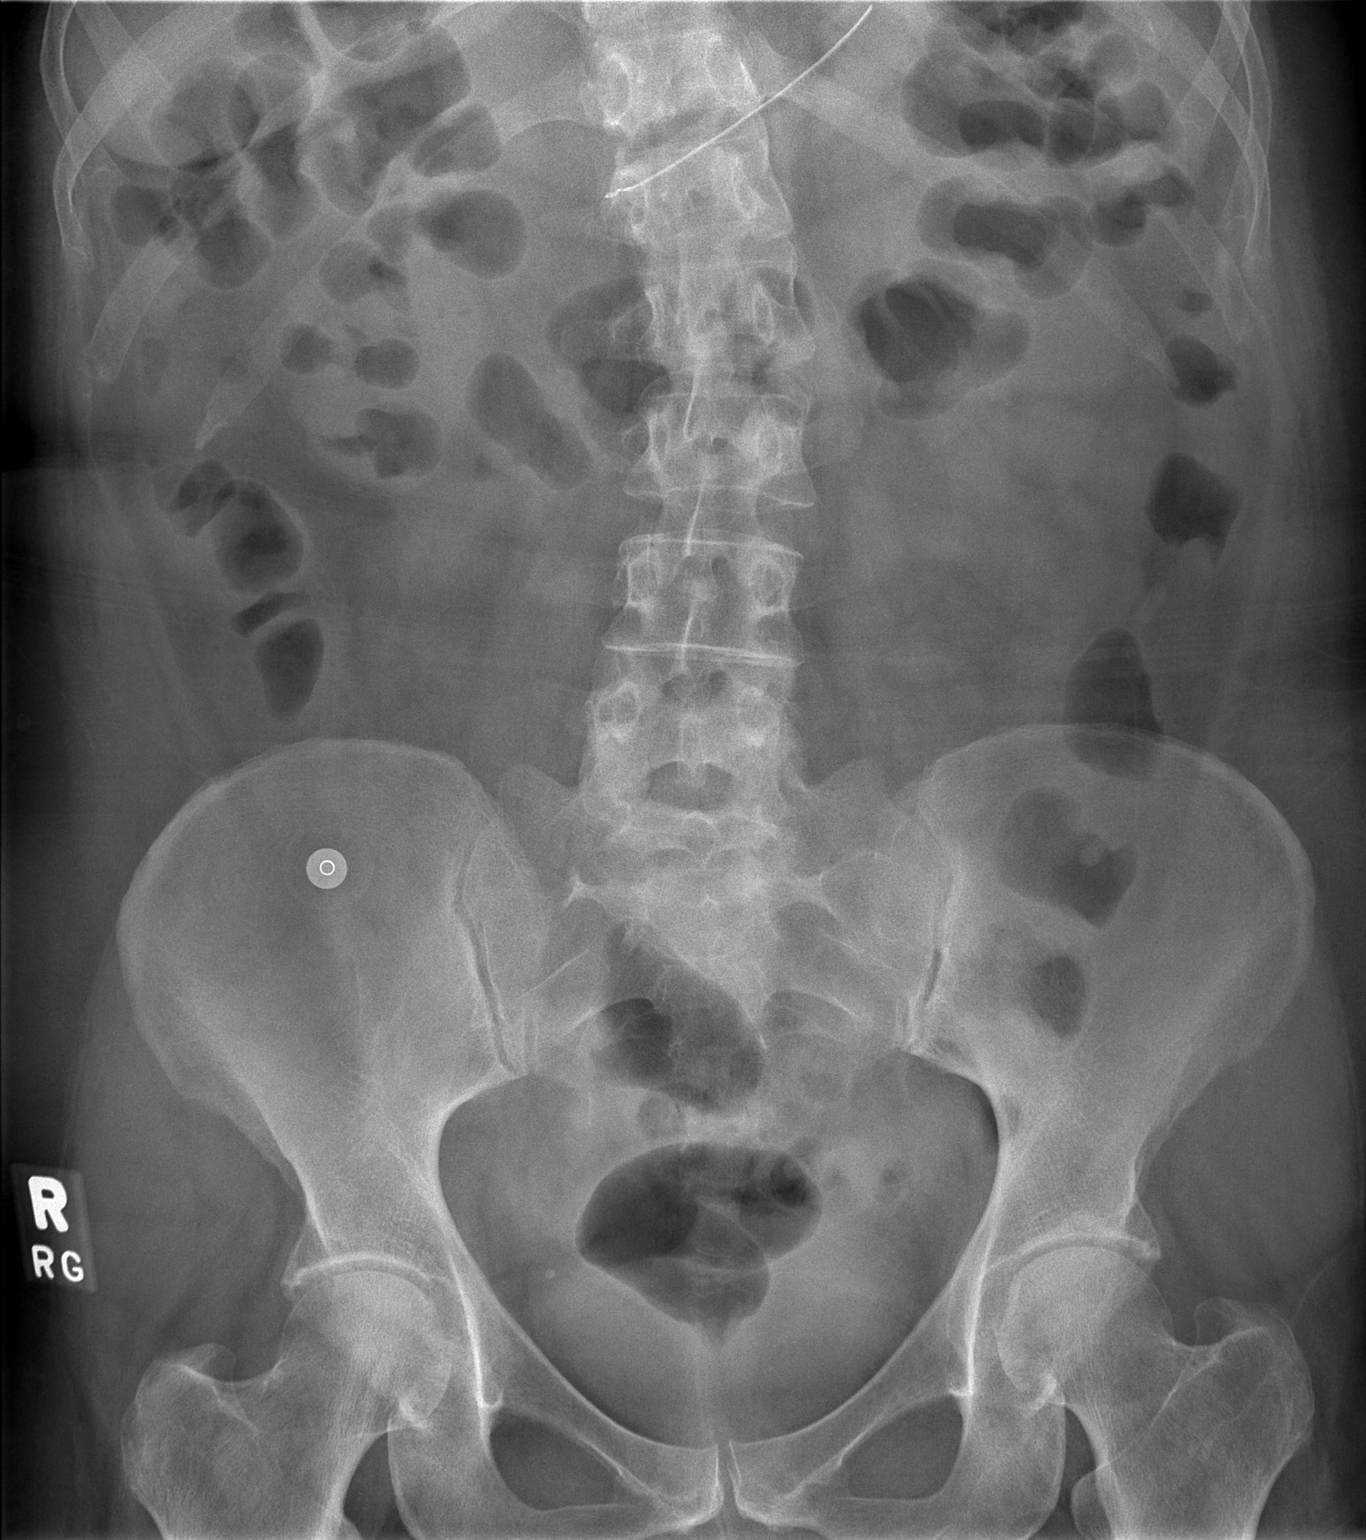

[2 of 2 positions shown; findings below may reference images not displayed]

FINDINGS: NG tube tip is in the distal stomach.  Interval improvement with decrease in small bowel distention.  Small amounts of scattered gas throughout the colon and in the rectosigmoid.
IMPRESSION: Marked improvement in SBO pattern.

## 2009-01-07 IMAGING — CR DG ABD PORTABLE 1V
1 series · 1 of 1 positions shown · non-contrast
Comparison: 02/12/07.

CLINICAL DATA: Small bowel obstruction. 
 ABDOMEN PORTABLE - 1 VIEW:

[view not recorded]
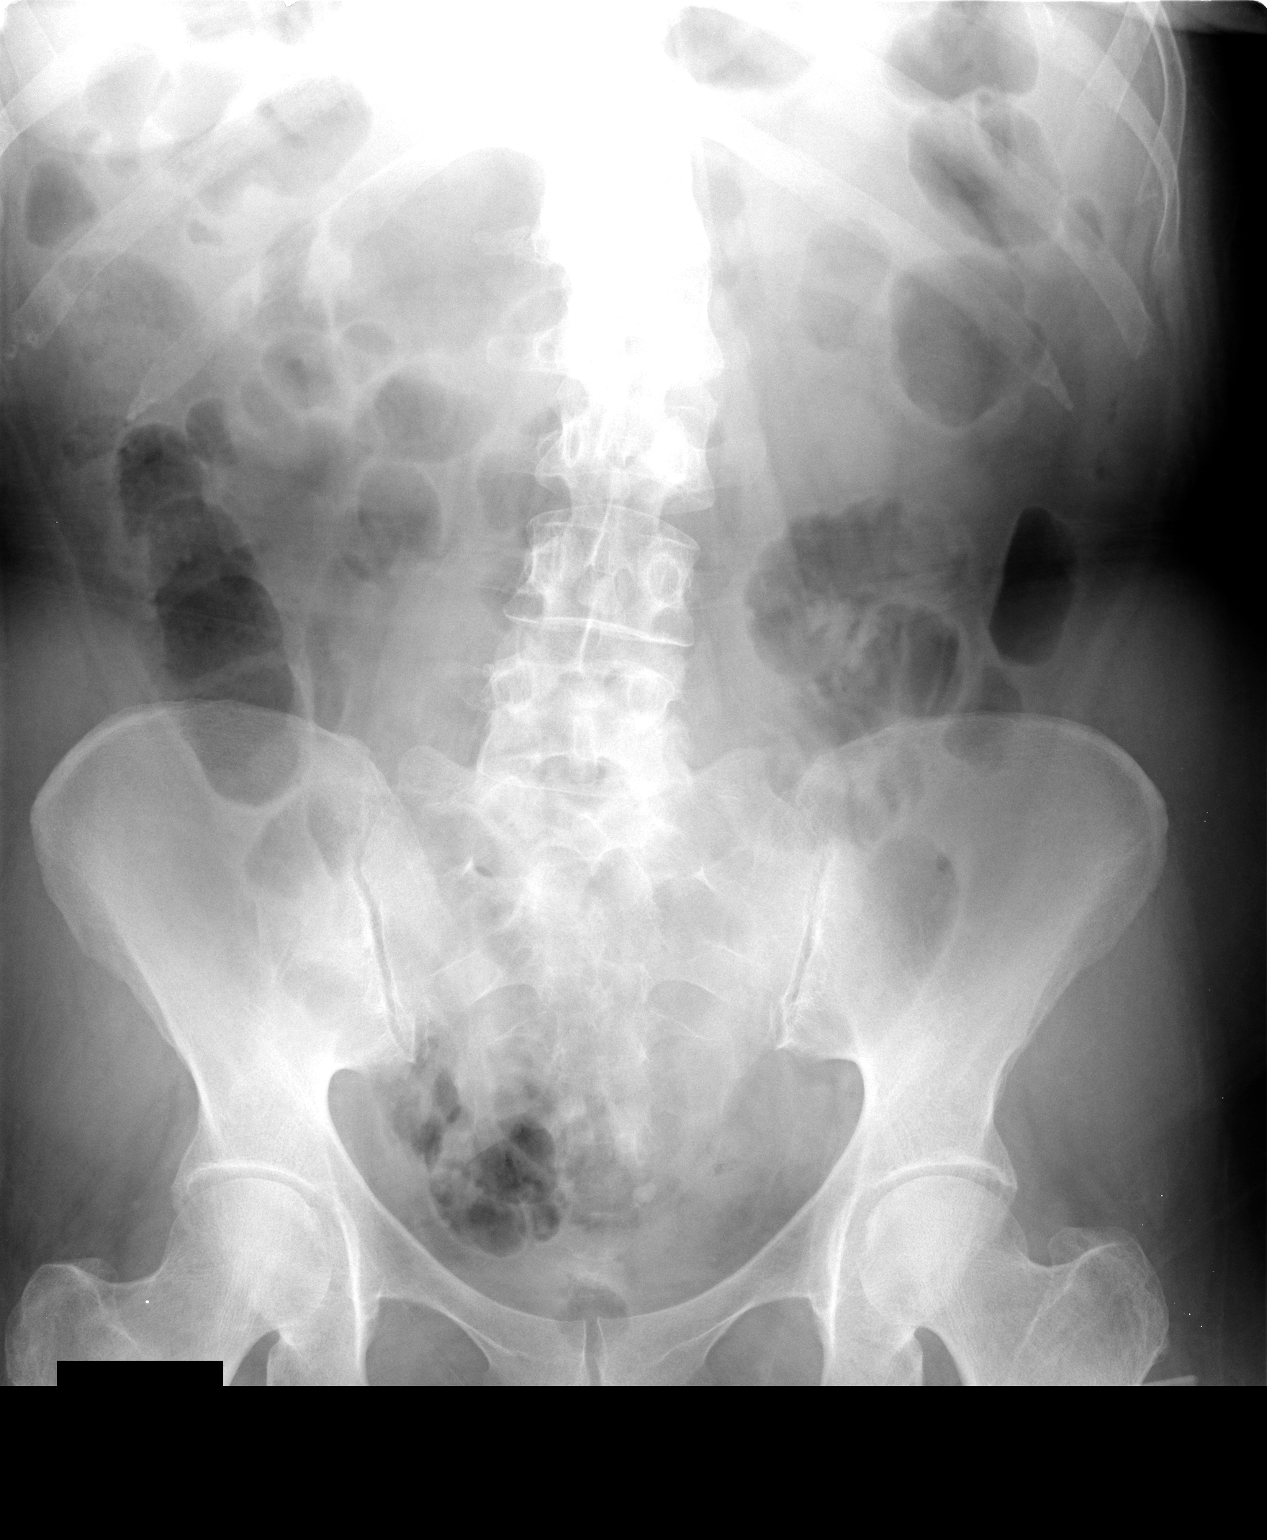

[1 of 1 positions shown; findings below may reference images not displayed]

FINDINGS: A few gas-filled loops of small bowel are now noted. Gas in the colon is present. No other significant changes are identified. A lumbar scoliosis is noted.
IMPRESSION: Nonspecific bowel gas pattern with a few upper limits of normal gas-filled loops of small bowel now noted.

## 2009-01-13 ENCOUNTER — Inpatient Hospital Stay: Payer: Self-pay | Admitting: Psychiatry

## 2009-01-25 ENCOUNTER — Ambulatory Visit: Payer: Self-pay | Admitting: Physician Assistant

## 2009-01-28 IMAGING — CR DG ABDOMEN ACUTE W/ 1V CHEST
4 series · 4 of 4 positions shown · non-contrast
Comparison: none

CLINICAL DATA: Abdominal pain.  Nausea and vomiting.   
 ACUTE ABDOMEN WITH CHEST:

[w chest pa]
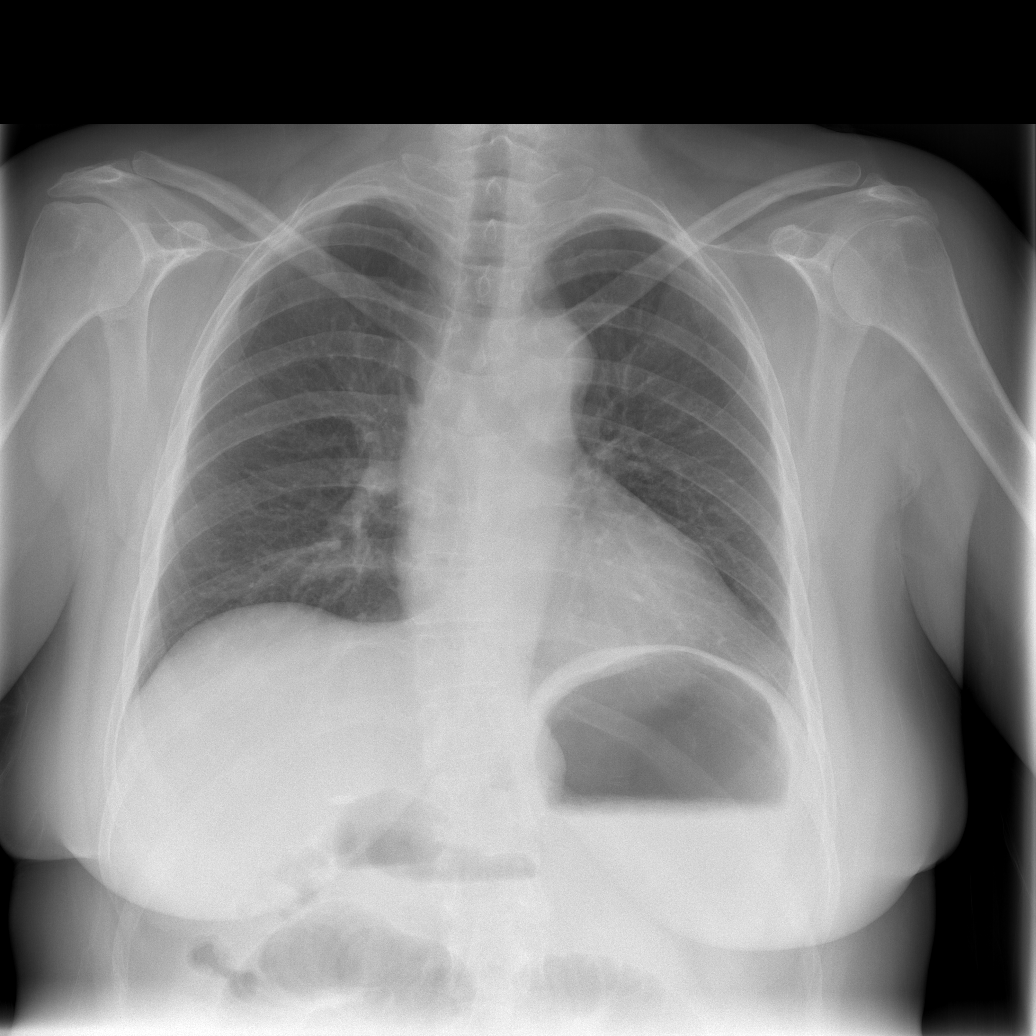

[w abdomen upright]
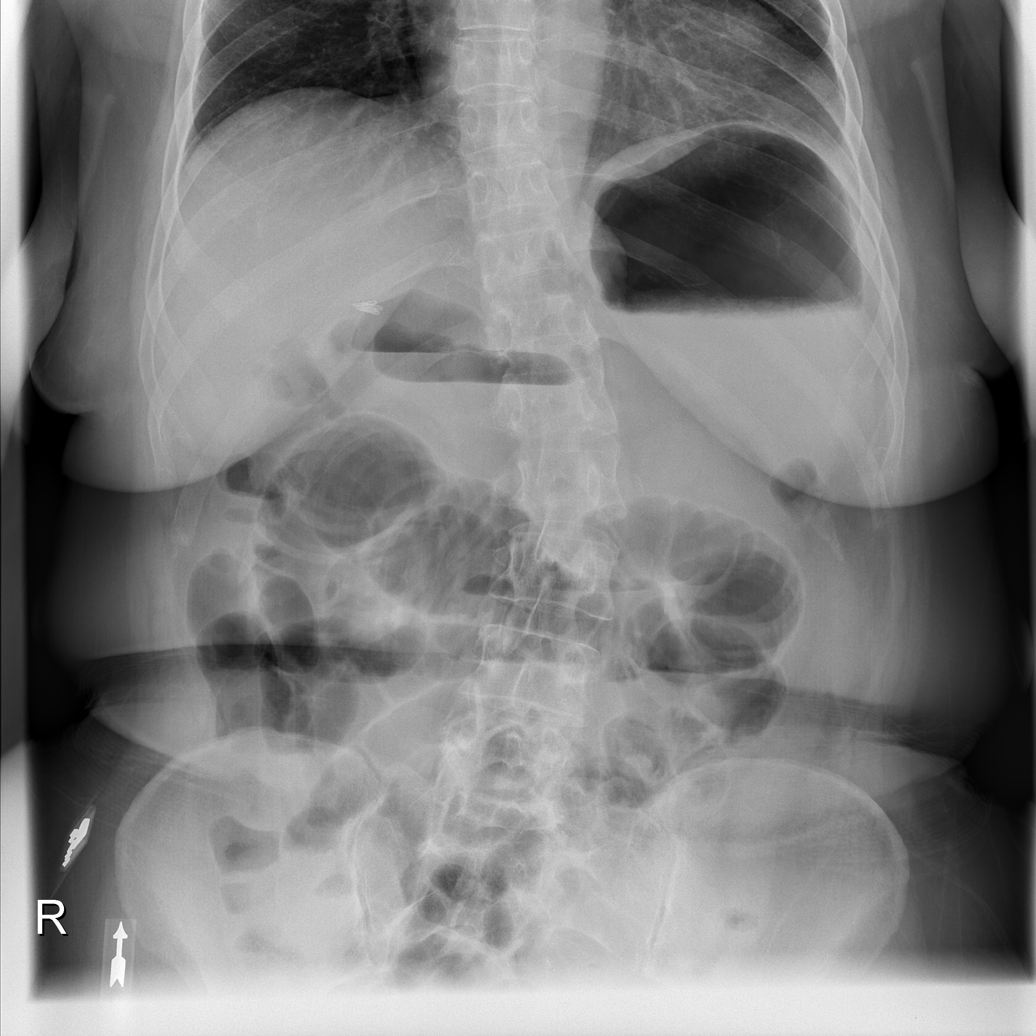

[t abdomen supine (1 of 2)]
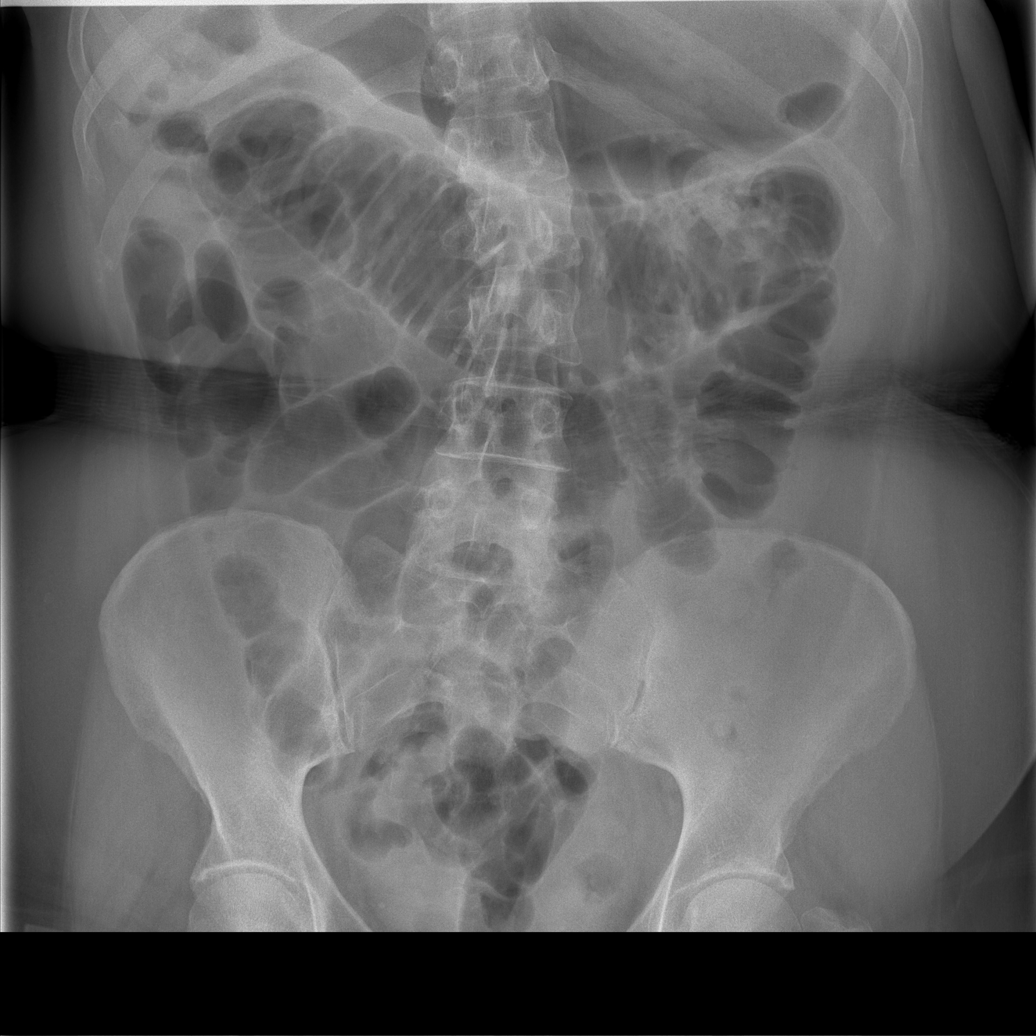

[t abdomen supine (2 of 2)]
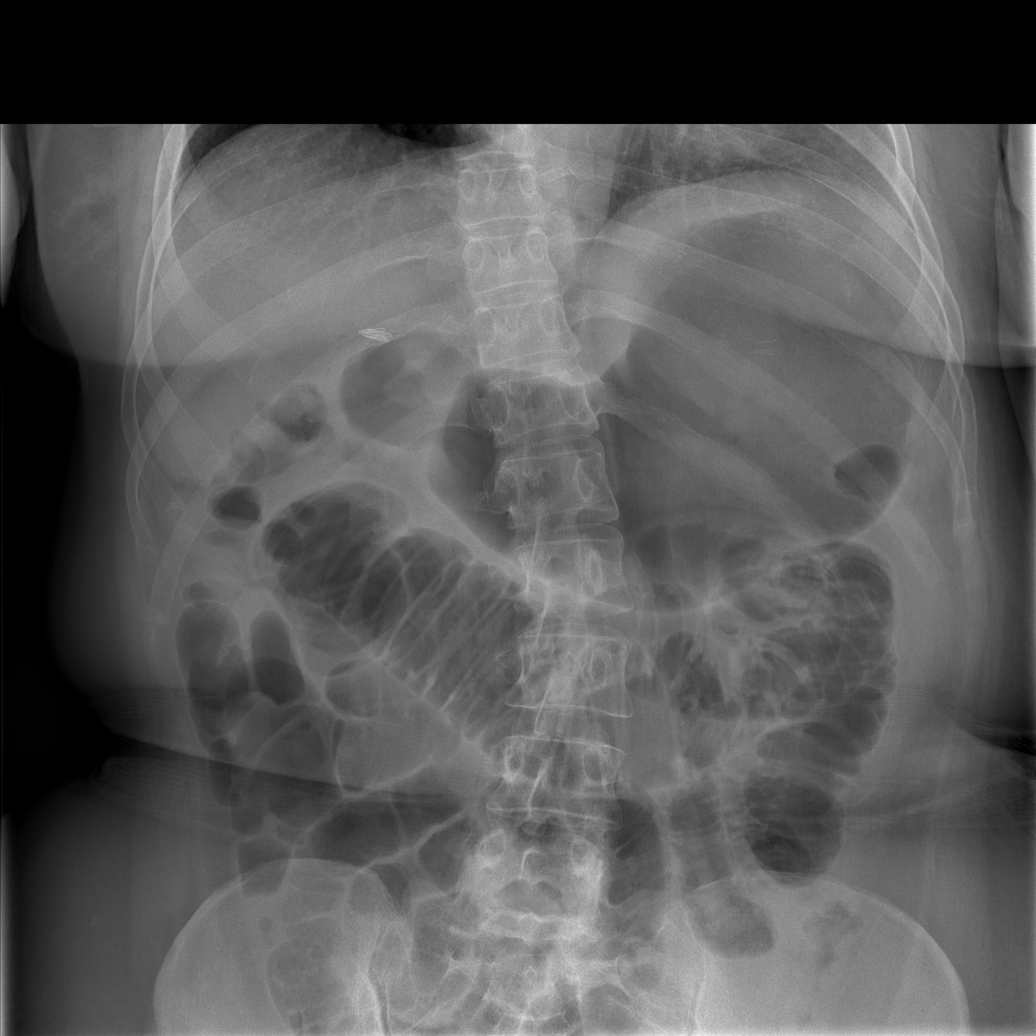

[4 of 4 positions shown; findings below may reference images not displayed]

FINDINGS: Dilated small bowel loops are seen containing air fluid levels, out of proportion to the amount of gas seen within the colon. This is suspicious for partial small bowel obstruction.  There is no evidence of free intraperitoneal air.  Surgical clips are seen in the right upper quadrant from prior cholecystectomy. 
 Heart size and mediastinal contours are normal.  Both lungs are clear.
IMPRESSION: 1.  Findings suspicious for partial small bowel obstruction. 
 2.  No active cardiopulmonary disease.

## 2009-01-29 IMAGING — CR DG ABD PORTABLE 1V
1 series · 1 of 1 positions shown · non-contrast
Comparison: 03/07/07.

CLINICAL DATA: Abdominal pain and ileus.
PORTABLE ABDOMEN ? 1 VIEW ? 03/08/07:

[view not recorded]
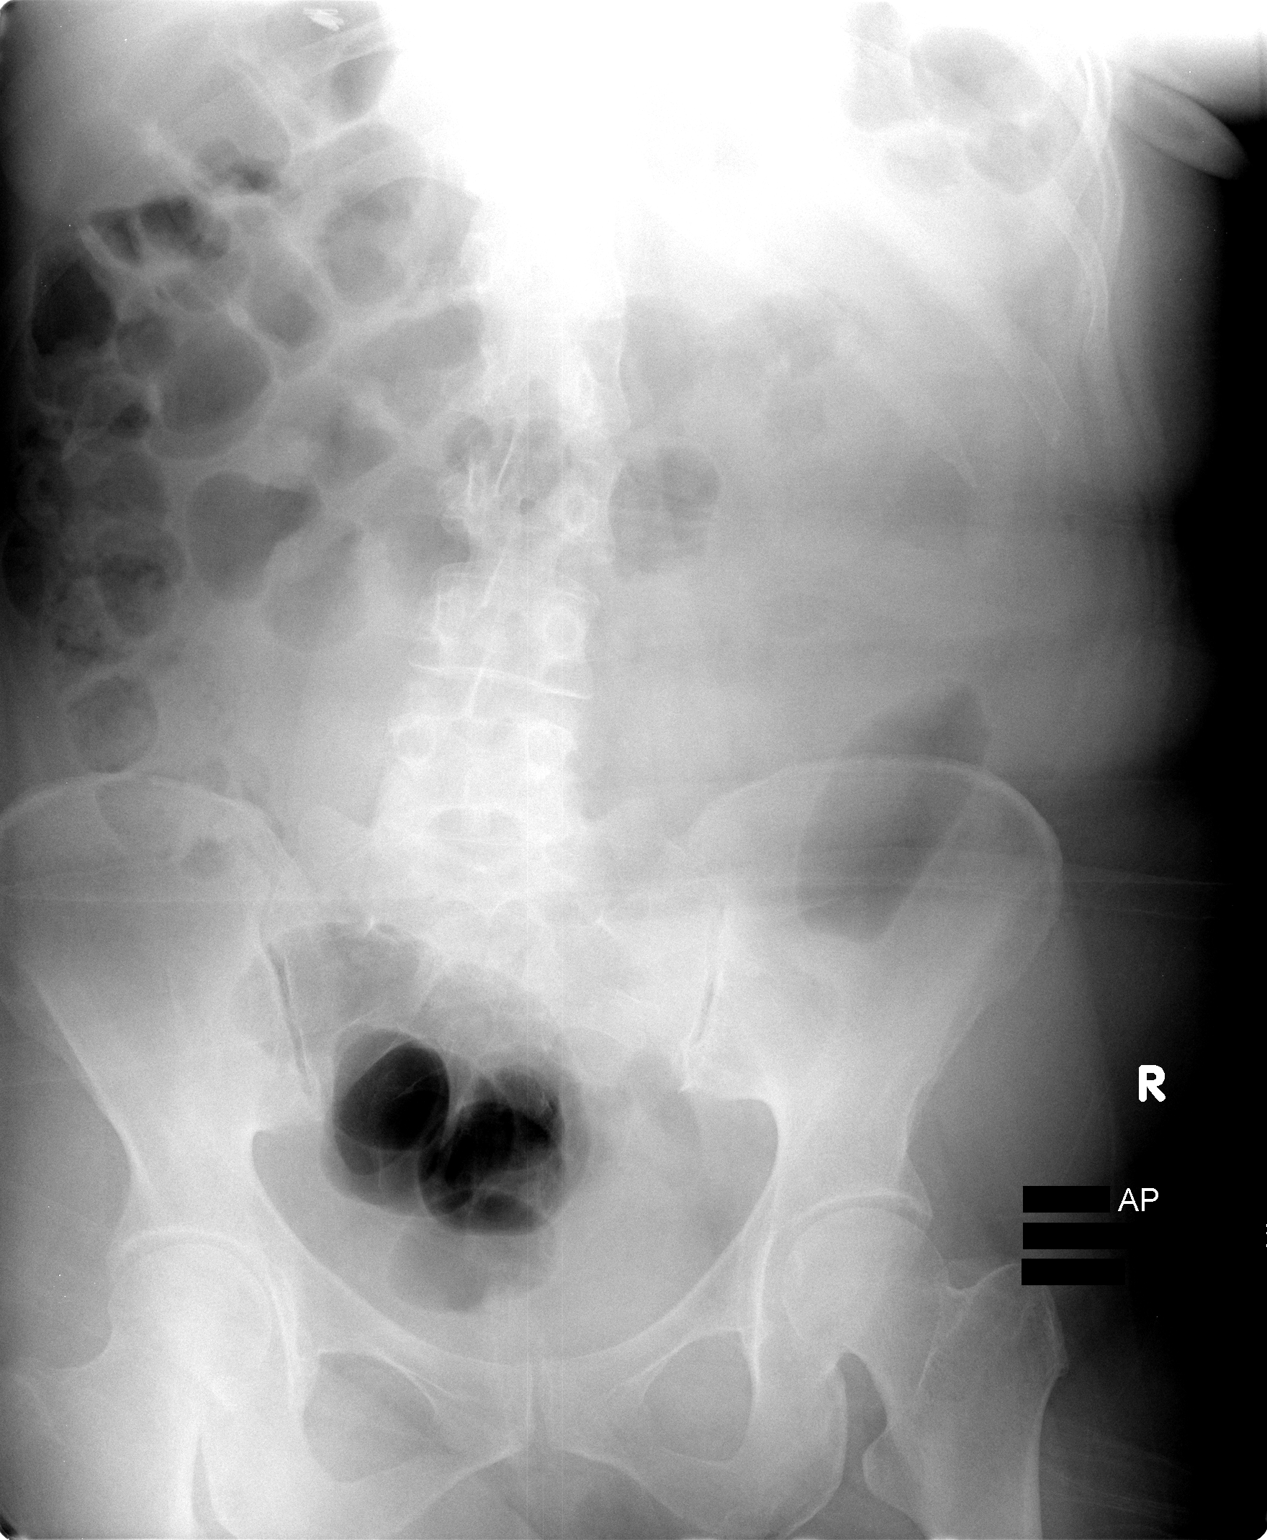

[1 of 1 positions shown; findings below may reference images not displayed]

FINDINGS: Portable supine view of the abdomen is obtained.  A nasogastric tube has been placed and there appears to be interval decompression of the stomach.  In addition, there is less distention of small bowel loops throughout the abdomen.  There appears to be some gas in the colon.  There is evidence of cholecystectomy.  Again seen is mild scoliosis of the spine.
IMPRESSION: 1. Placement of nasogastric tube and interval decompression of the stomach.
2. Decreased small bowel distention.  Bowel gas pattern is nonspecific.

## 2009-02-01 IMAGING — CR DG ABDOMEN ACUTE W/ 1V CHEST
3 series · 3 of 3 positions shown · non-contrast
Comparison: 03/08/2007

CLINICAL DATA: Nausea vomiting and abdominal pain

[w chest pa]
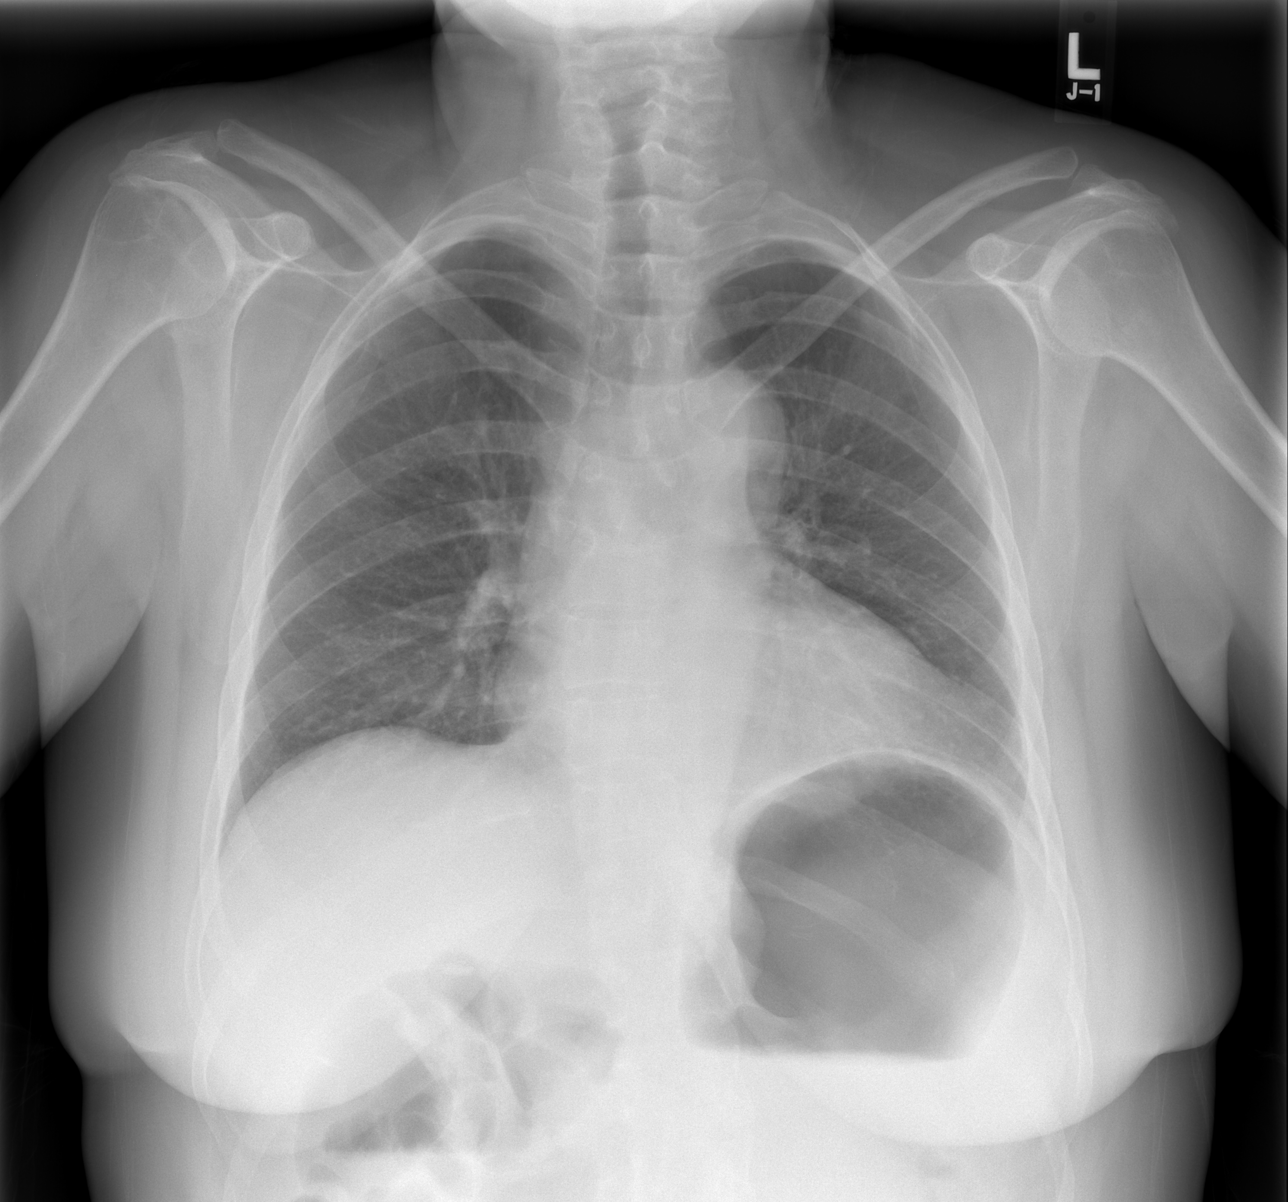

[w abdomen upright]
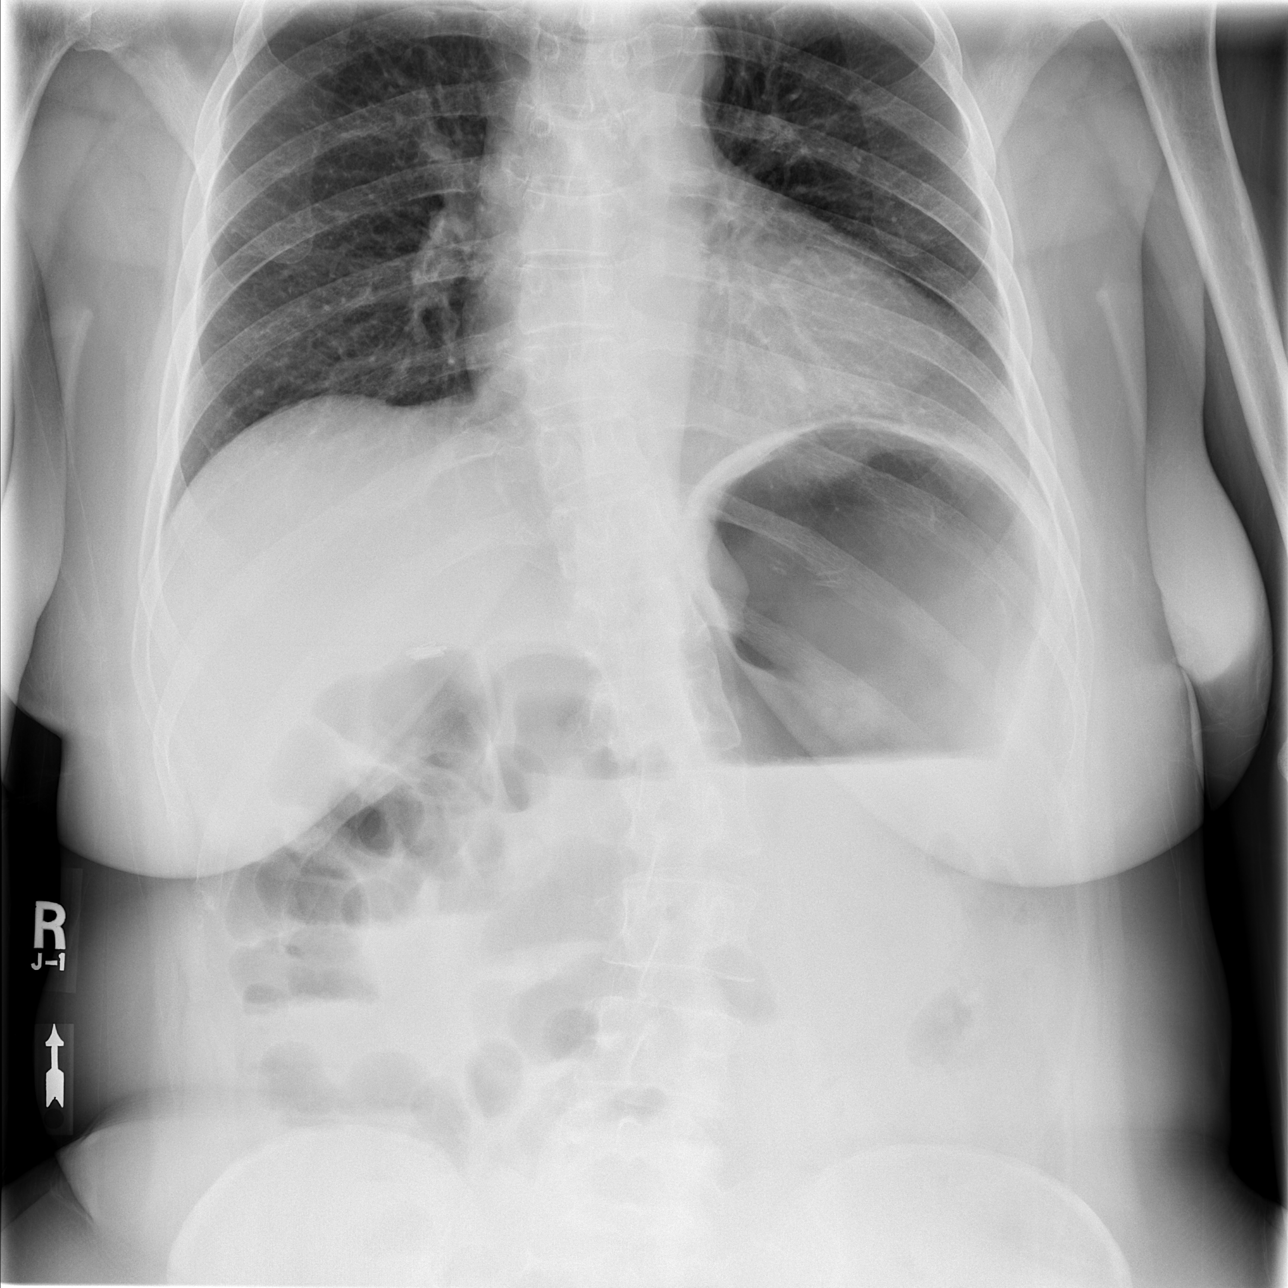

[t abdomen supine]
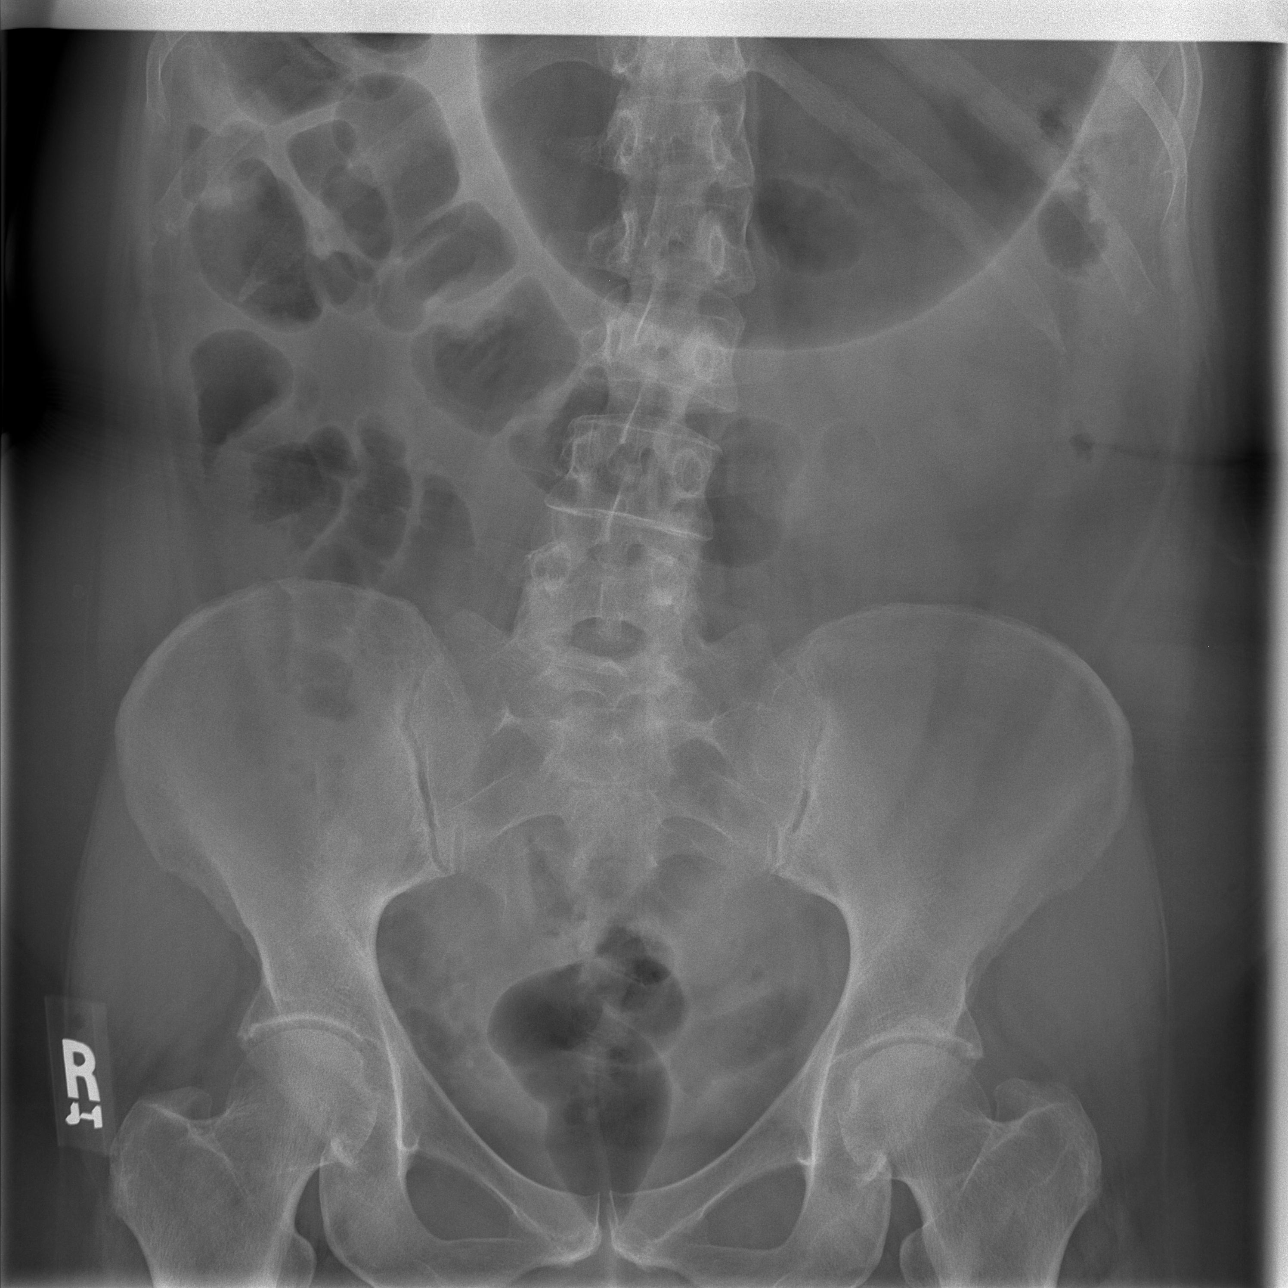

[3 of 3 positions shown; findings below may reference images not displayed]

ABDOMEN SERIES - 2 VIEW AND CHEST - 1 VIEW:

Low lung volumes. The lungs are clear without focal infiltrate, edema or pleural
effusion. The cardiopericardial silhouette is within normal limits for size.

Moderate gastric distention. No intraperitoneal free air. No gaseous small bowel
dilation to suggest obstruction. Air is seen within a nondilated colon and a few
loops of nondistended small bowel in the right lower quadrant.
IMPRESSION: No acute cardiopulmonary process.

No intraperitoneal free air. There is moderate gastric distention without
evidence for small bowel obstruction.

## 2009-03-23 ENCOUNTER — Ambulatory Visit: Payer: Self-pay | Admitting: Physician Assistant

## 2009-03-25 ENCOUNTER — Emergency Department: Payer: Self-pay | Admitting: Emergency Medicine

## 2009-03-28 IMAGING — CR DG ABDOMEN ACUTE W/ 1V CHEST
3 series · 3 of 3 positions shown · non-contrast
Comparison: 03/11/07.

CLINICAL DATA: Nausea/vomiting/diarrhea.
 ACUTE ABDOMINAL SERIES WITH CHEST ? 3 VIEW:

[w chest pa]
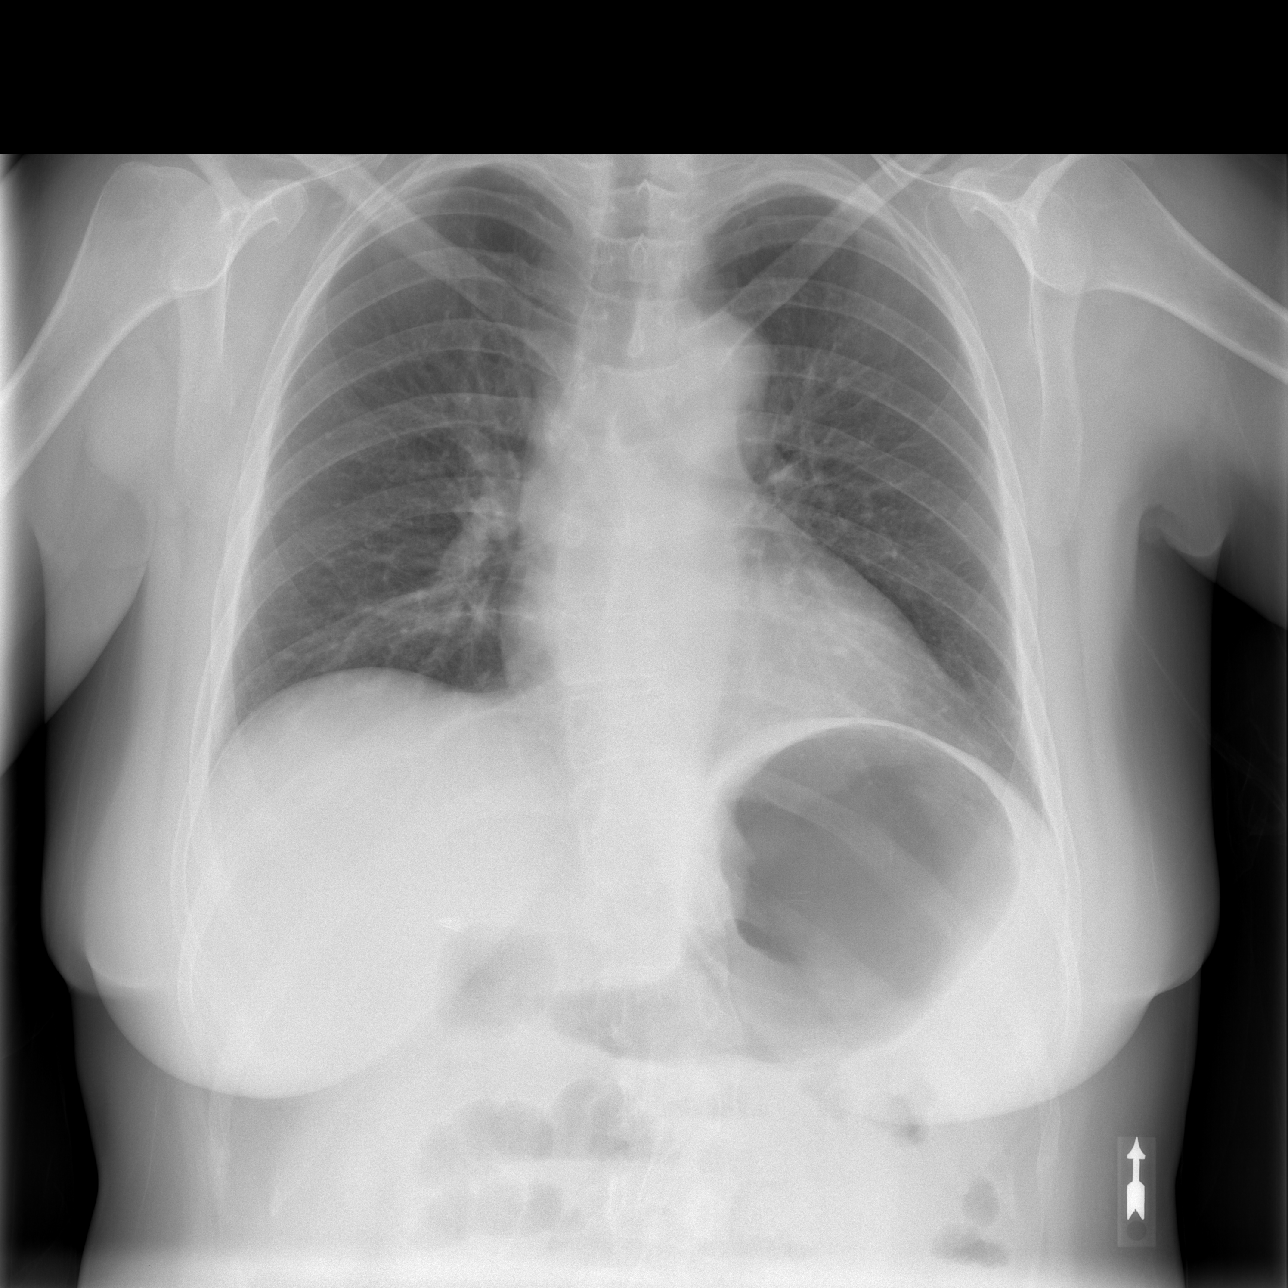

[w abdomen upright *]
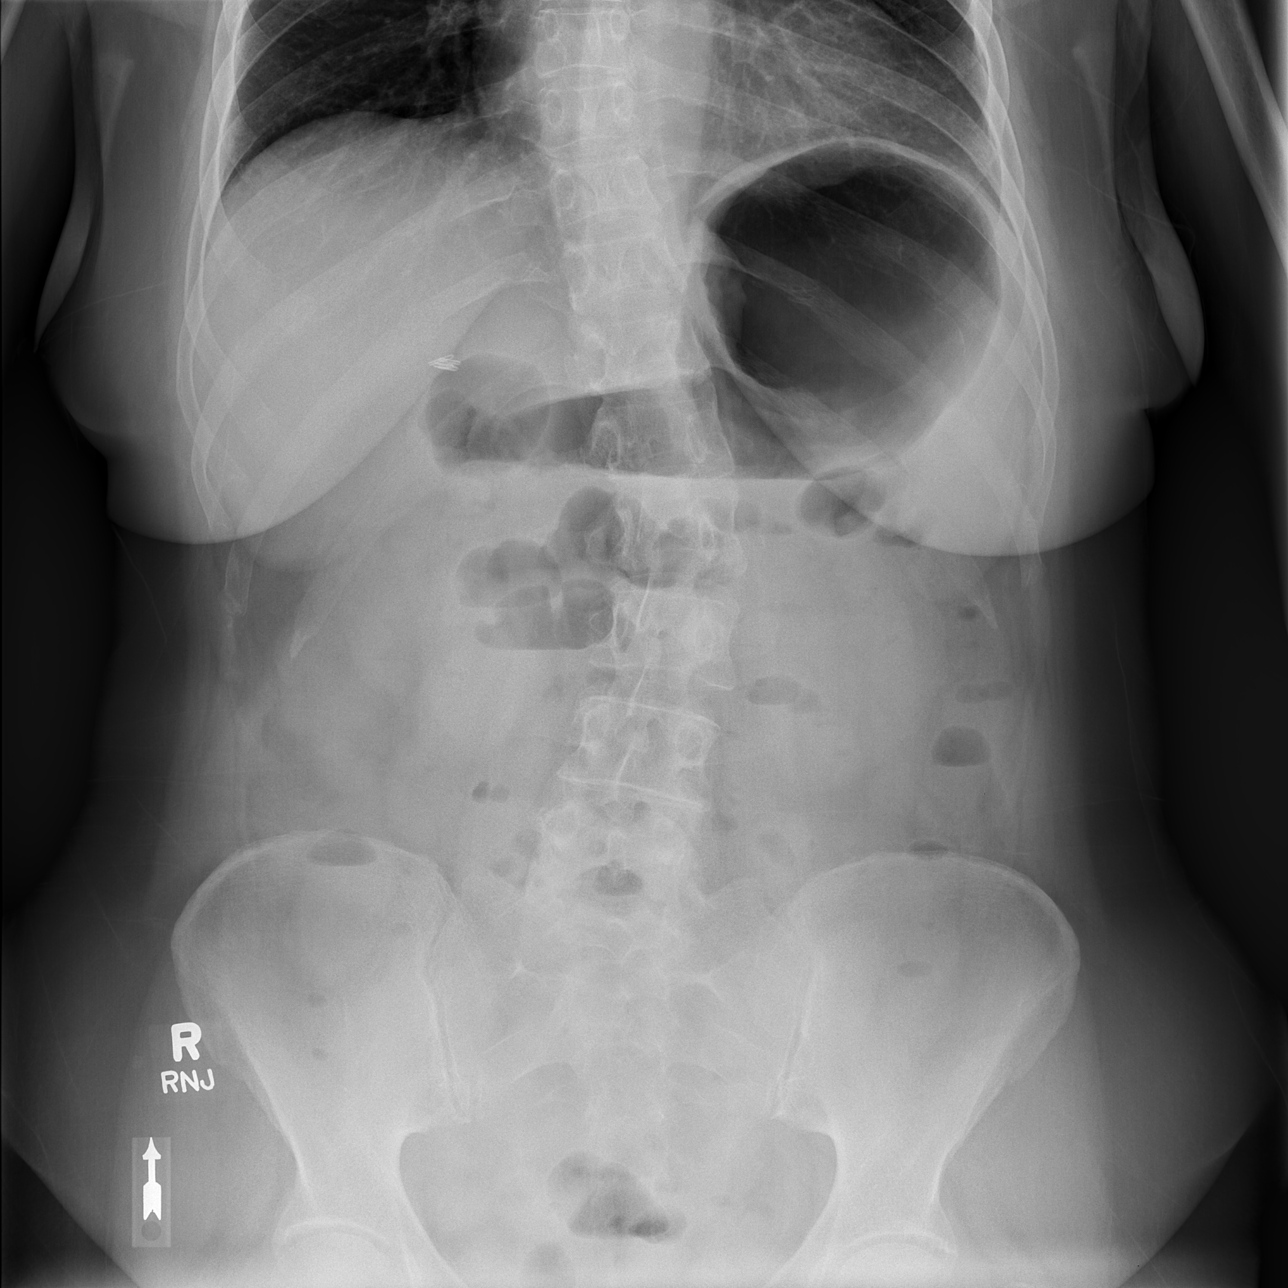

[t abdomen supine]
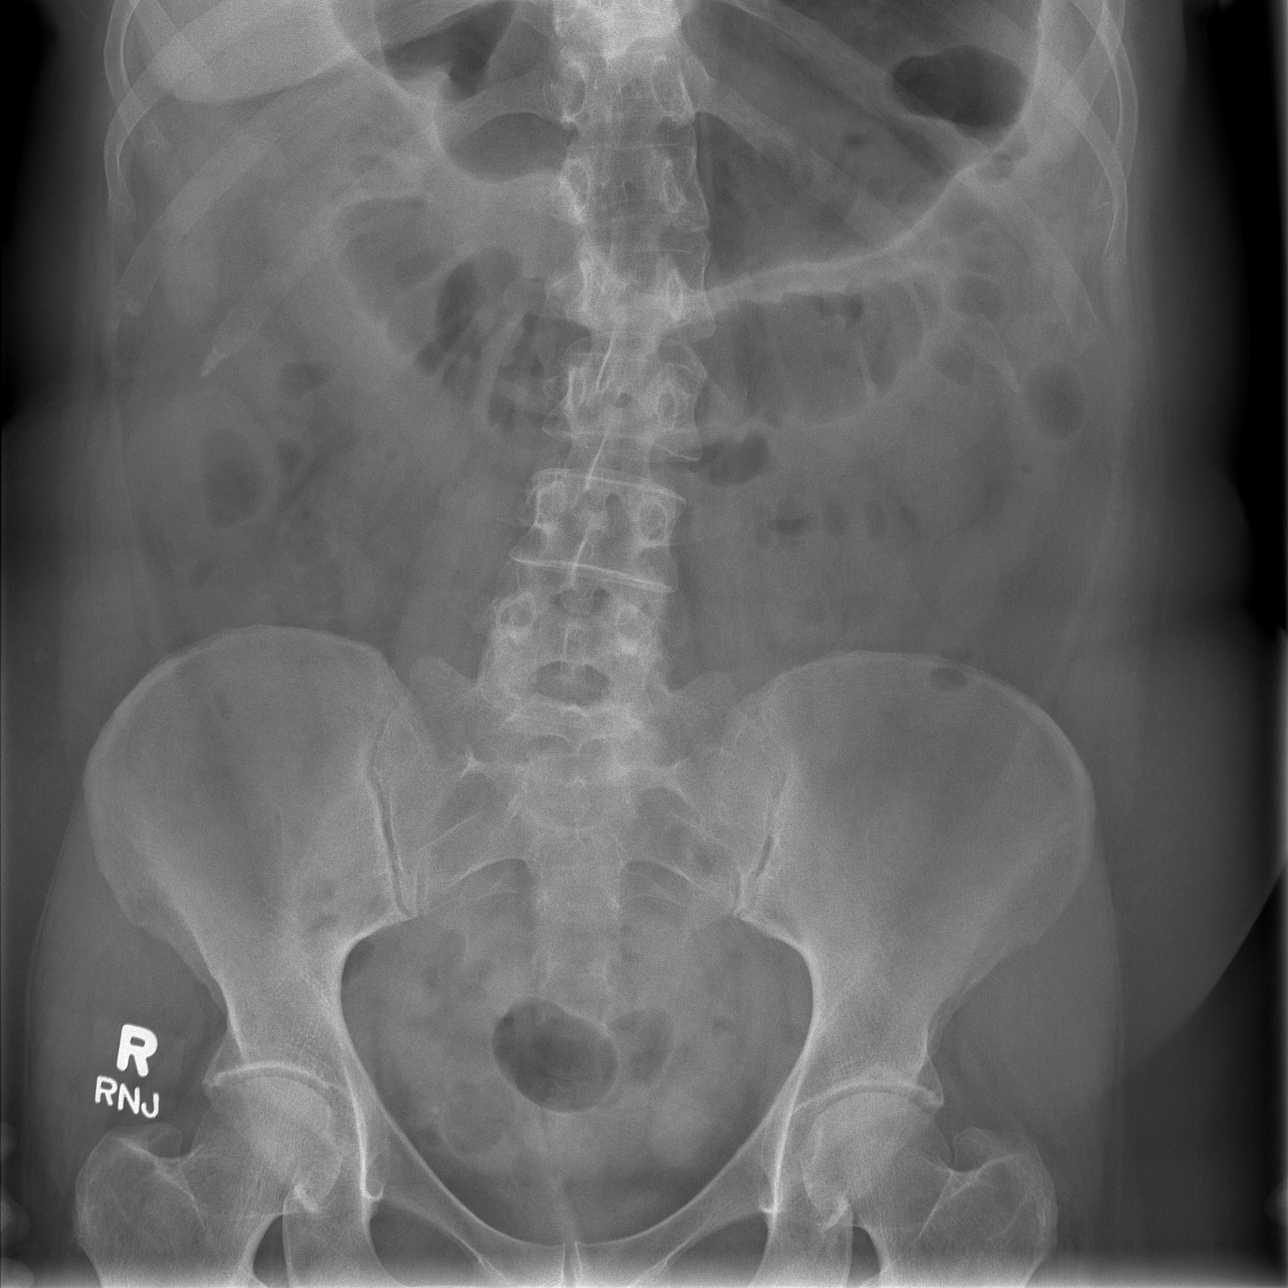

[3 of 3 positions shown; findings below may reference images not displayed]

FINDINGS: Heart size is within normal limits.  No congestive heart failure or pneumonia.  Moderate gastric distention as previously noted.  A few scattered short segment air fluid levels are noted without evidence for bowel obstruction.  The findings are most consistent with adynamic ileus.  Prior cholecystectomy.  Cholecystectomy clips are present.
IMPRESSION: 1. No active cardiopulmonary disease. 
 2. Abdomen unremarkable except for moderate gastric distention ? no significant change.

## 2009-04-01 ENCOUNTER — Emergency Department: Payer: Self-pay | Admitting: Emergency Medicine

## 2009-04-03 IMAGING — CR DG ABDOMEN ACUTE W/ 1V CHEST
4 series · 4 of 4 positions shown · non-contrast
Comparison: none

HISTORY: Abdominal pain, nausea, vomiting

[w chest pa]
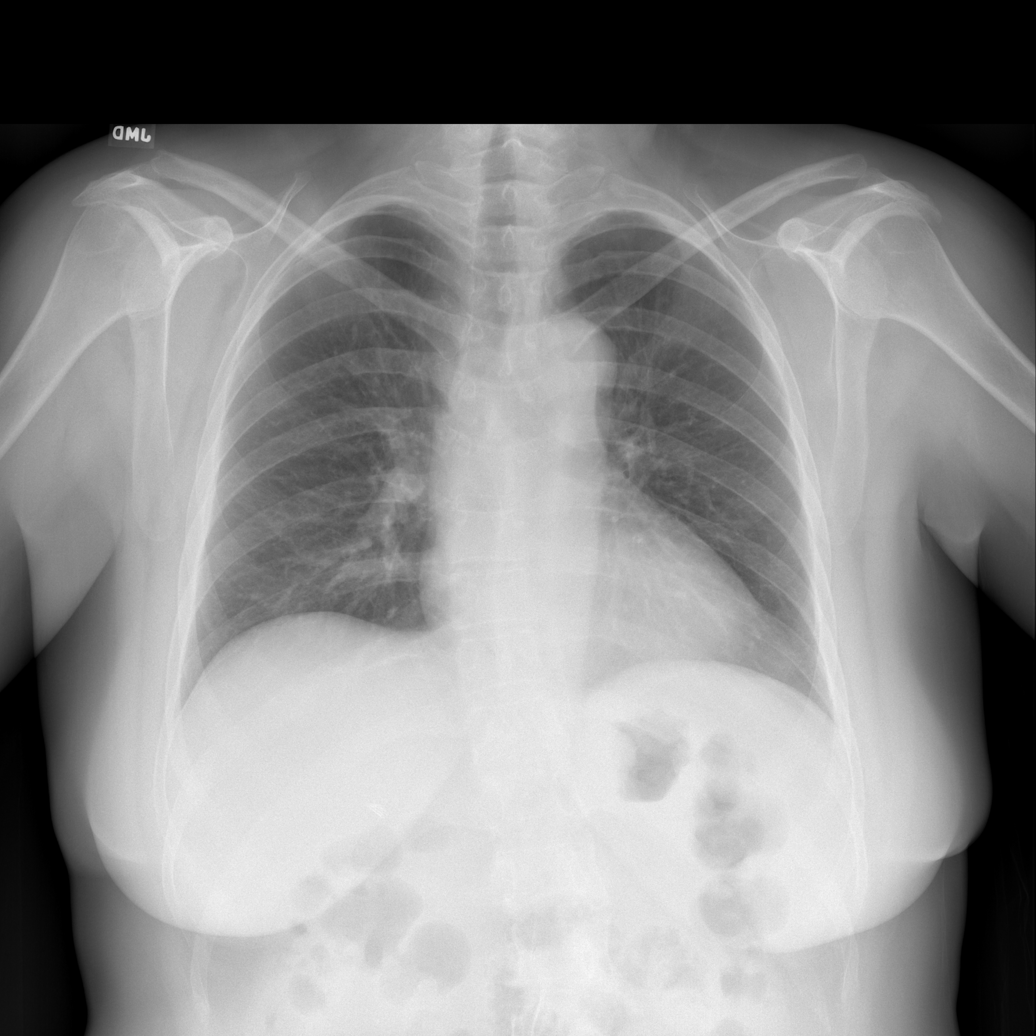

[w abdomen upright *]
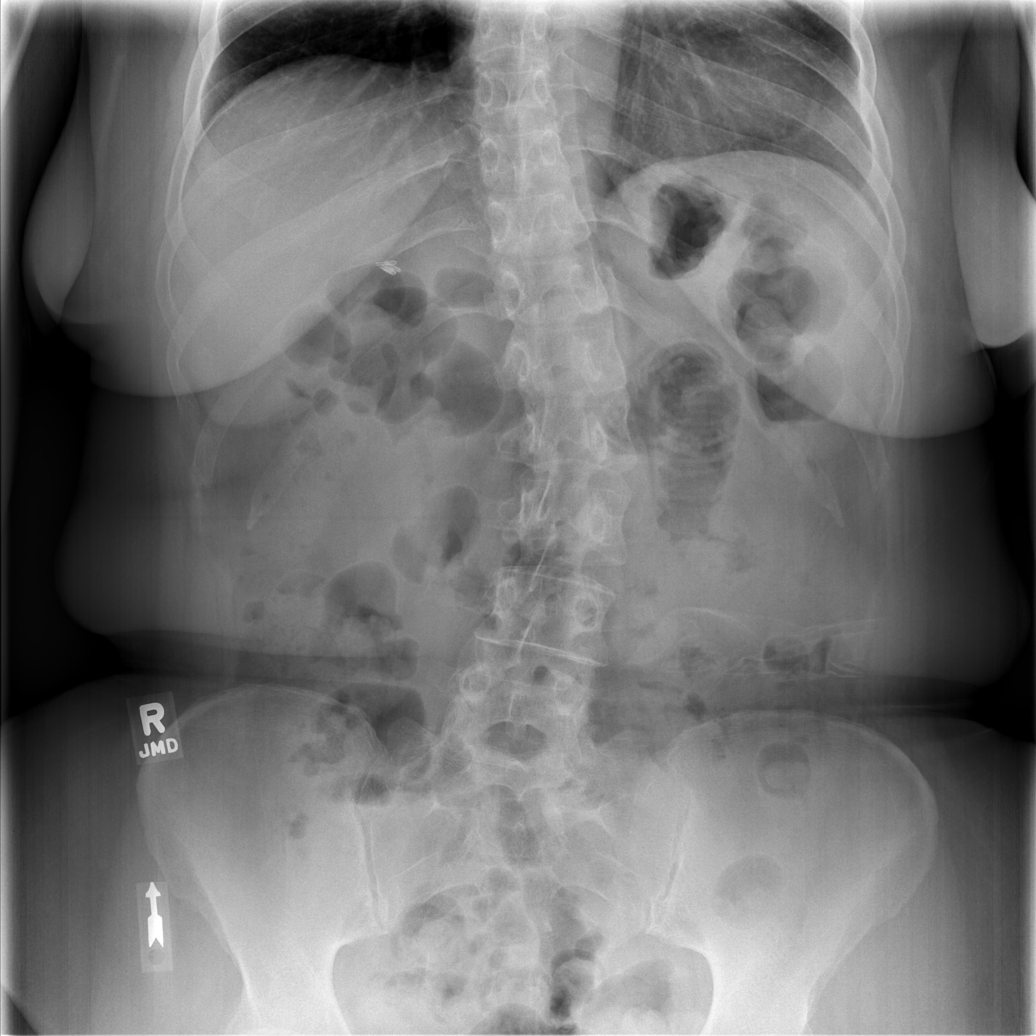

[t abdomen supine (1 of 2)]
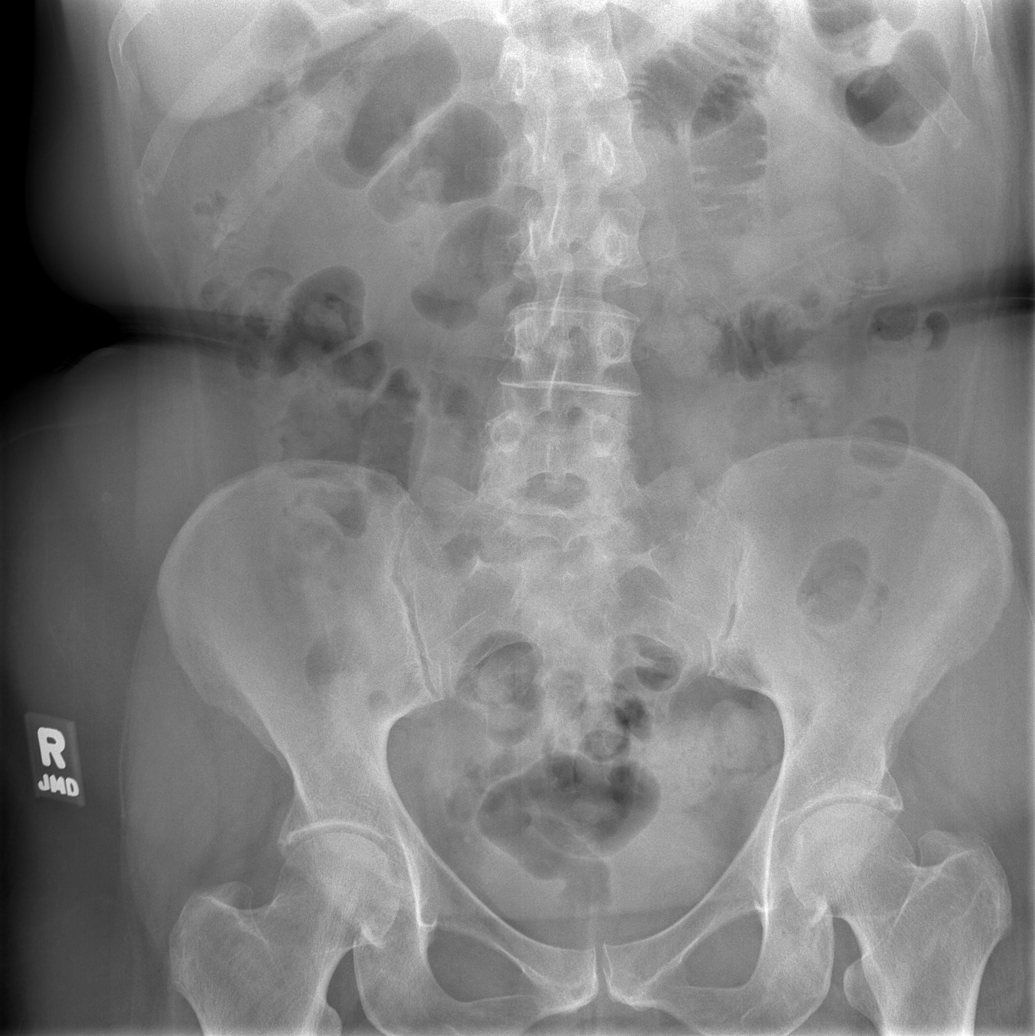

[t abdomen supine (2 of 2)]
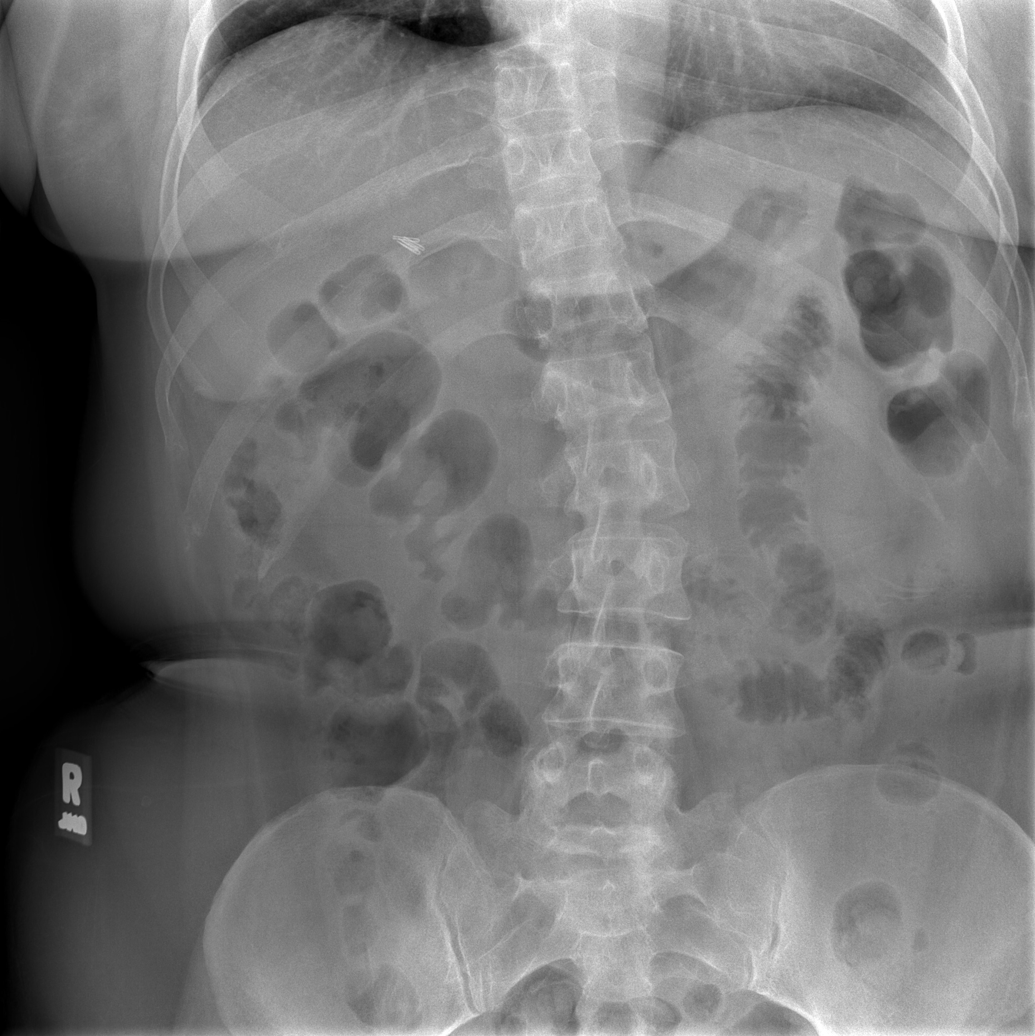

[4 of 4 positions shown; findings below may reference images not displayed]

ABDOMEN ACUTE WITH PA CHEST:

Comparison 05/05/2007

Borderline cardiac enlargement.
Normal mediastinal contours and pulmonary vascularity.
Minimal bronchitic changes.
Lungs otherwise clear.

Nonspecific bowel gas pattern with air filled nondistended loops of large and
small bowel in abdomen.
Single nonspecific small bowel loop in left midabdomen is minimally more
prominent.
However no evidence of bowel obstruction, bowel wall thickening, or free air.
Surgical clips right upper quadrant question cholecystectomy.
Broad-based levoconvex scoliosis lumbar spine.
No urinary tract calcification.
Small right pelvic phleboliths stable.
IMPRESSION: No acute findings.

## 2009-04-16 ENCOUNTER — Inpatient Hospital Stay: Payer: Self-pay | Admitting: Internal Medicine

## 2009-04-24 ENCOUNTER — Emergency Department: Payer: Self-pay | Admitting: Emergency Medicine

## 2009-04-30 IMAGING — CR DG ABDOMEN ACUTE W/ 1V CHEST
4 series · 4 of 4 positions shown · non-contrast
Comparison: 05/11/07
 Scattered air-fluid levels with distended loop of proximal small bowel.

CLINICAL DATA: Abdominal pain with nausea and vomiting.
 ACUTE ABDOMINAL SERIES (2 VIEWS) INCLUDING PA CHEST (1 VIEW):

[w chest pa]
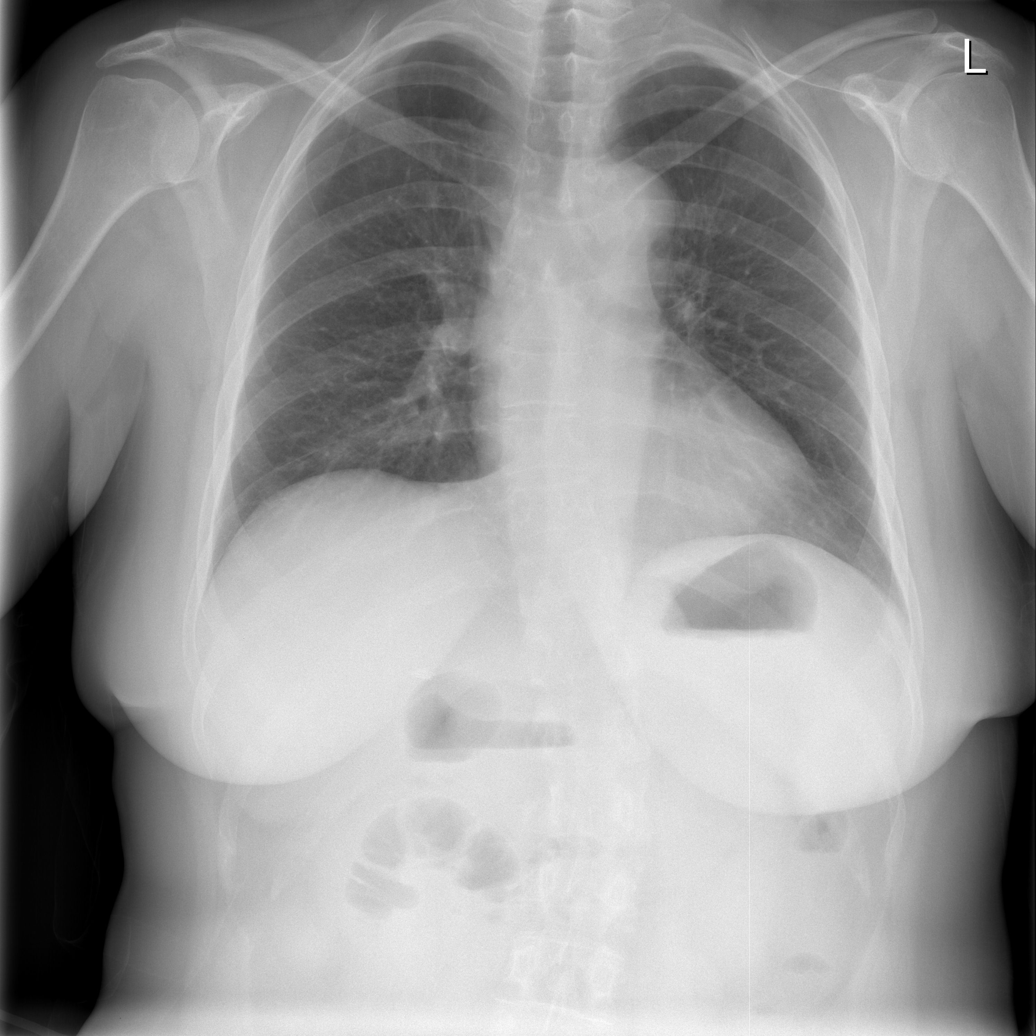

[w abdomen upright *]
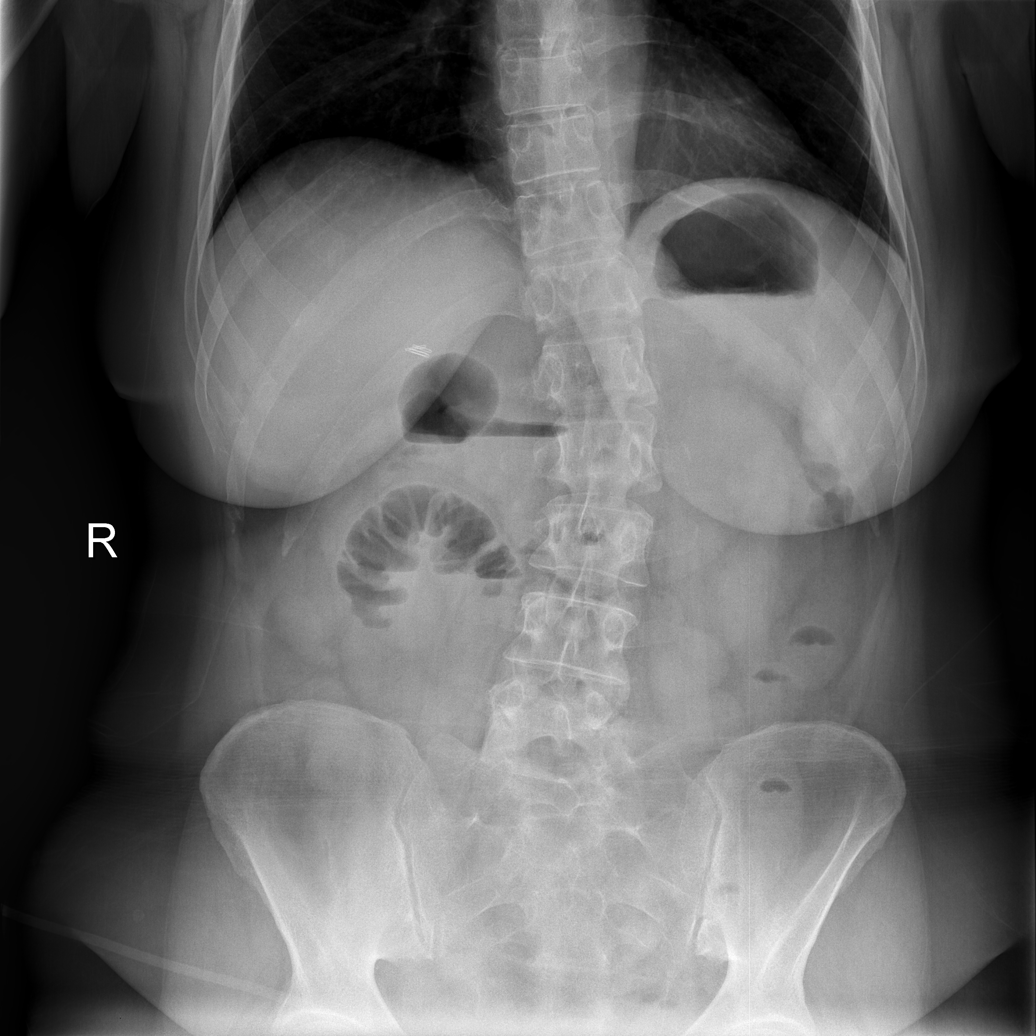

[t abdomen supine (1 of 2)]
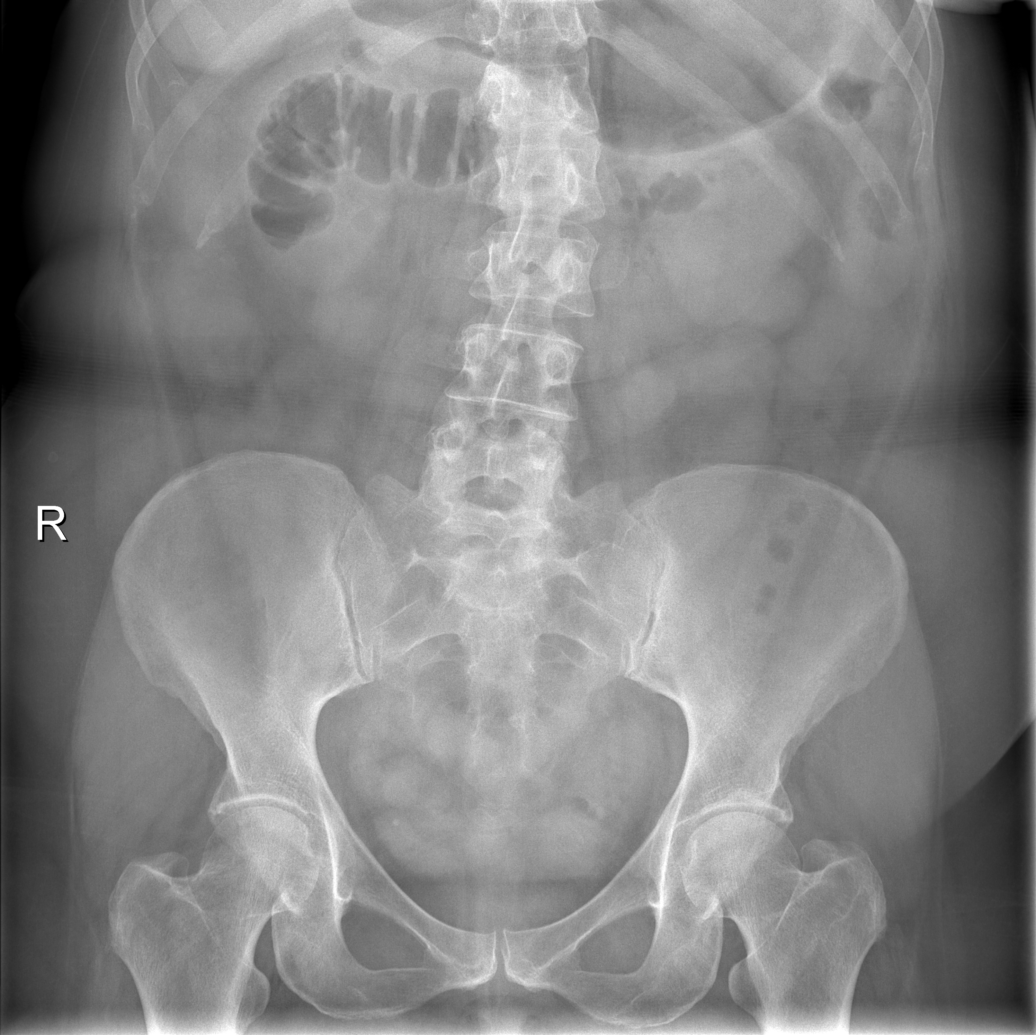

[t abdomen supine (2 of 2)]
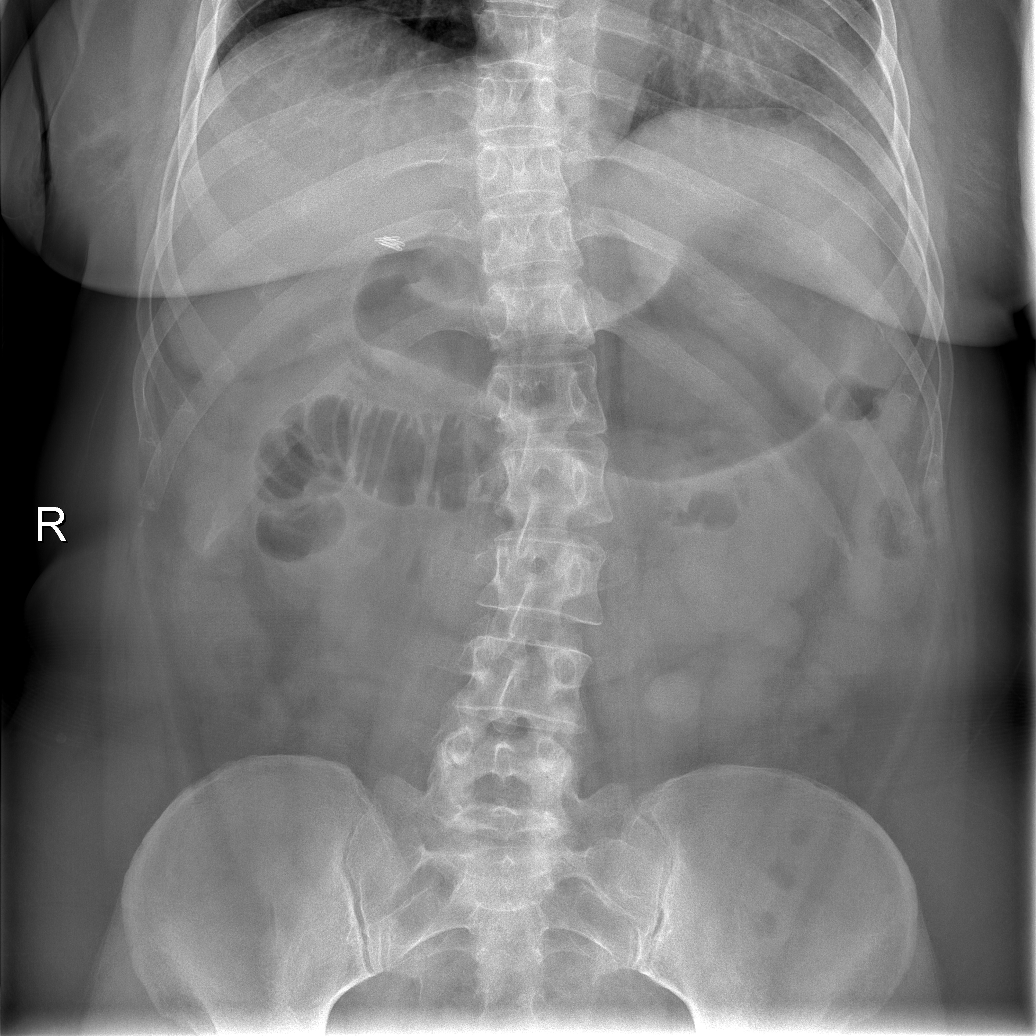

[4 of 4 positions shown; findings below may reference images not displayed]

There is a paucity of bowel gas distally.  Stomach is air-filled and distended.  
 Lung bases are clear.  The heart is normal.
IMPRESSION: Small bowel obstruction.  Close followup is warranted.

## 2009-05-03 IMAGING — CR DG ABDOMEN 2V
3 series · 3 of 3 positions shown · non-contrast
Comparison: 06/07/07 and earlier.

CLINICAL DATA: 51-year-old female.  Follow up small bowel obstruction.  
 ABDOMEN - 2 VIEW:

[w abdomen upright *]
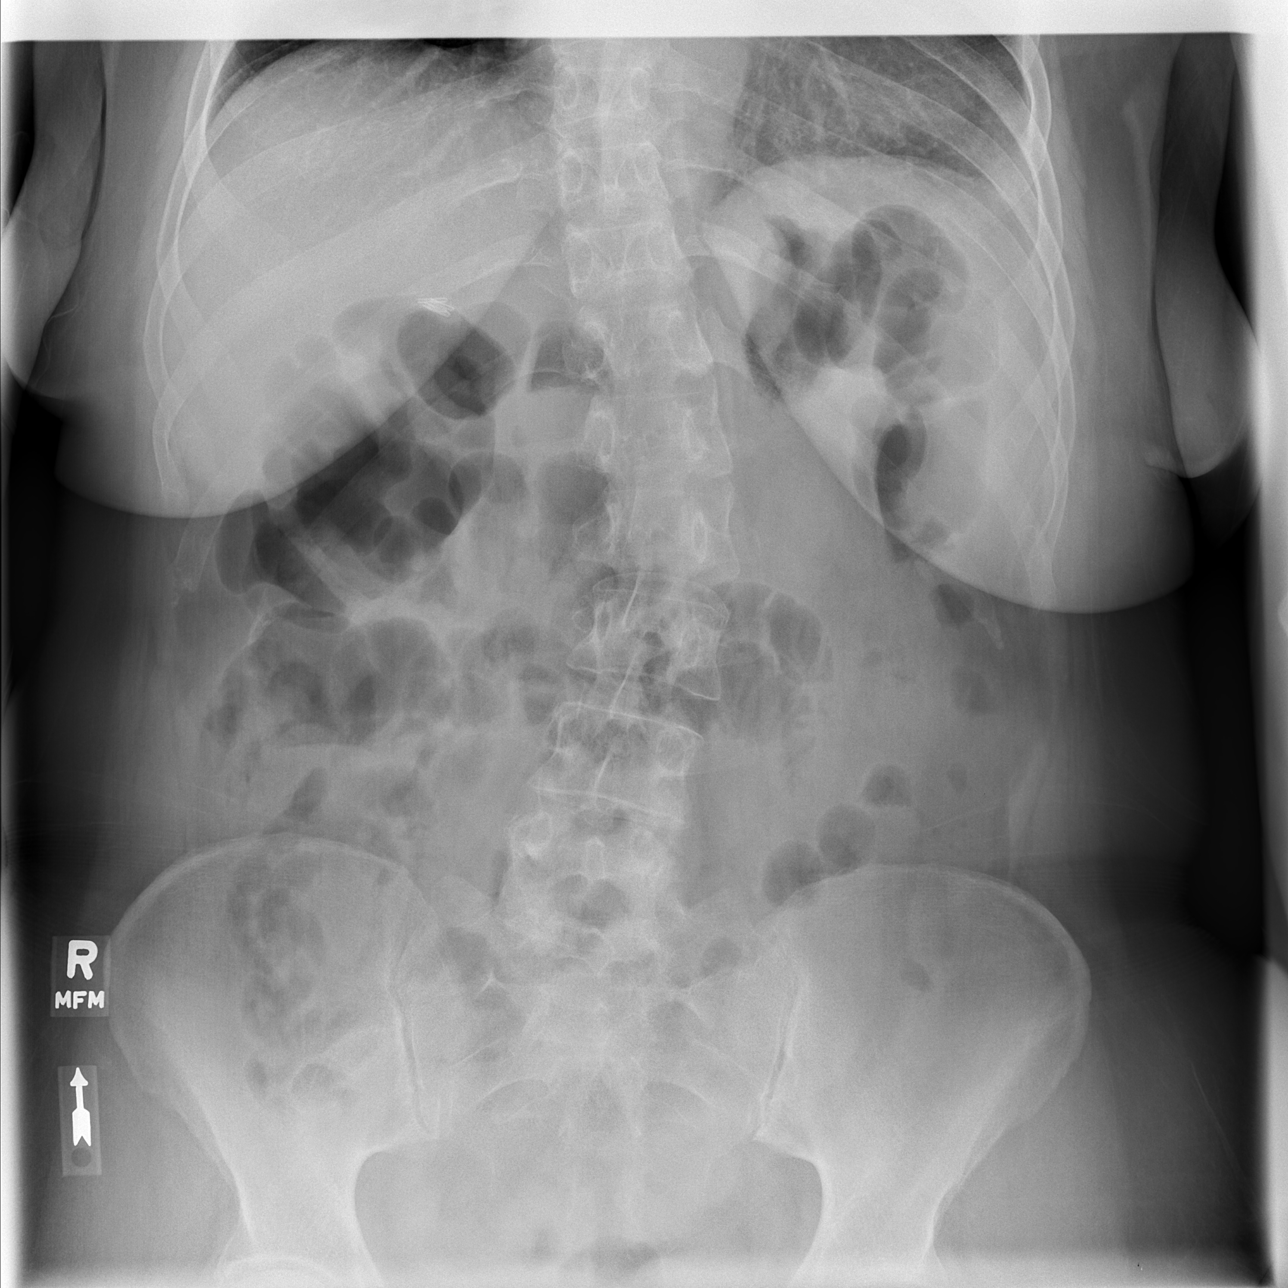

[w abdomen upright]
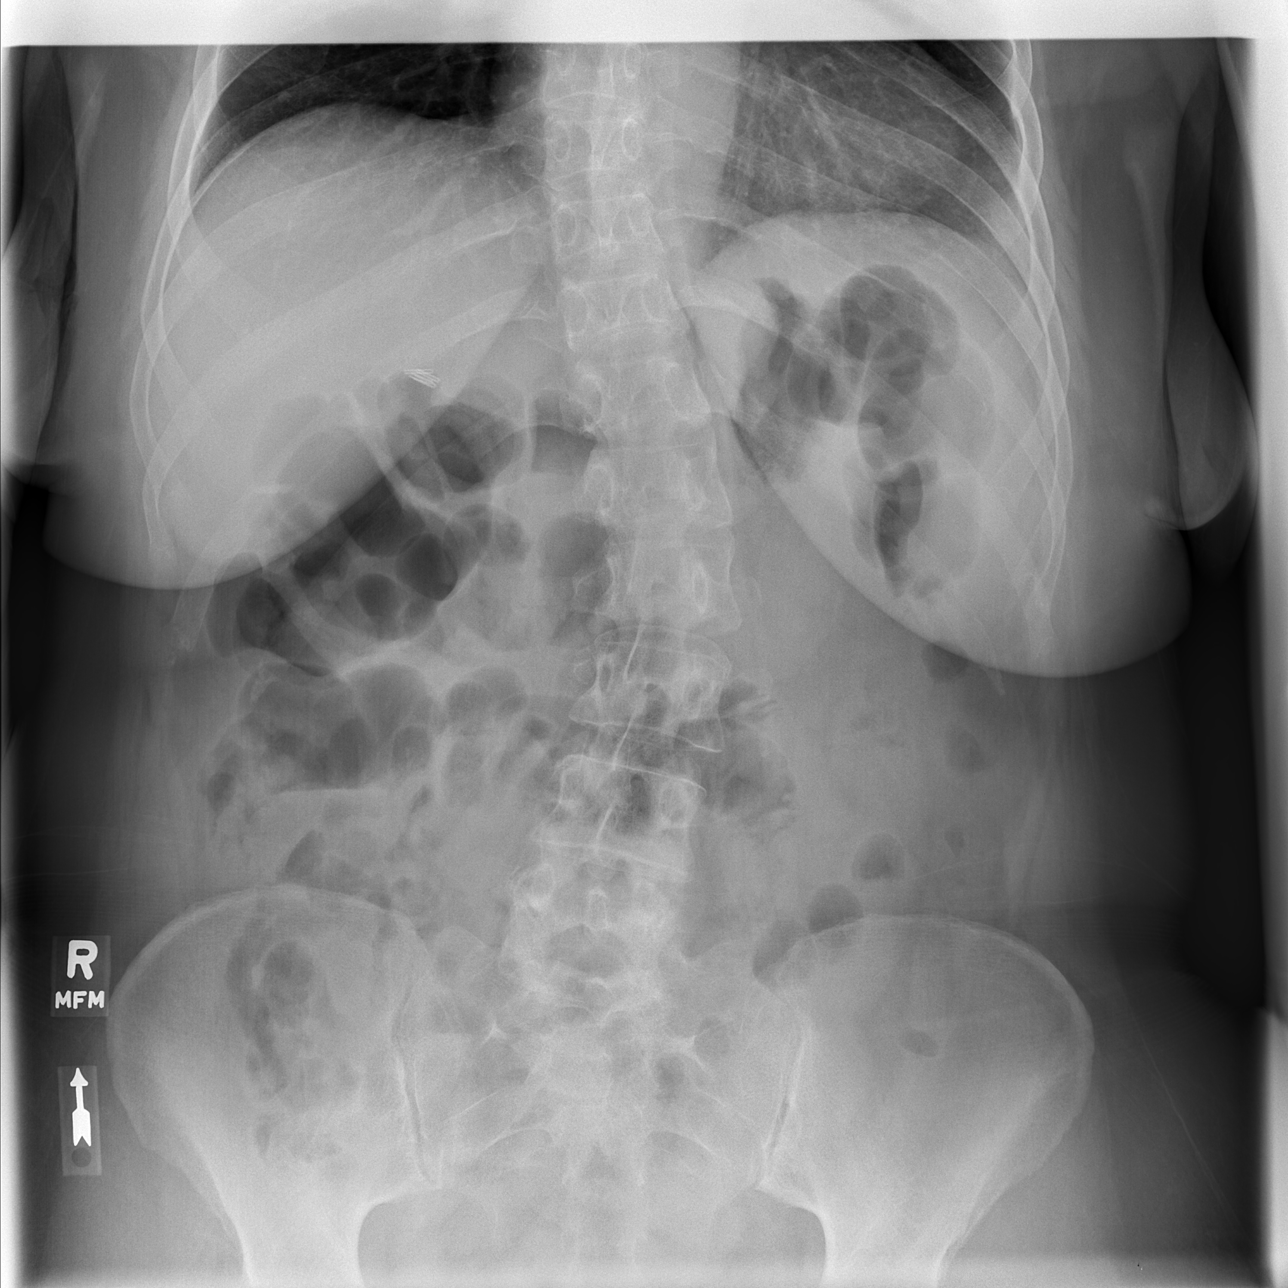

[t abdomen supine]
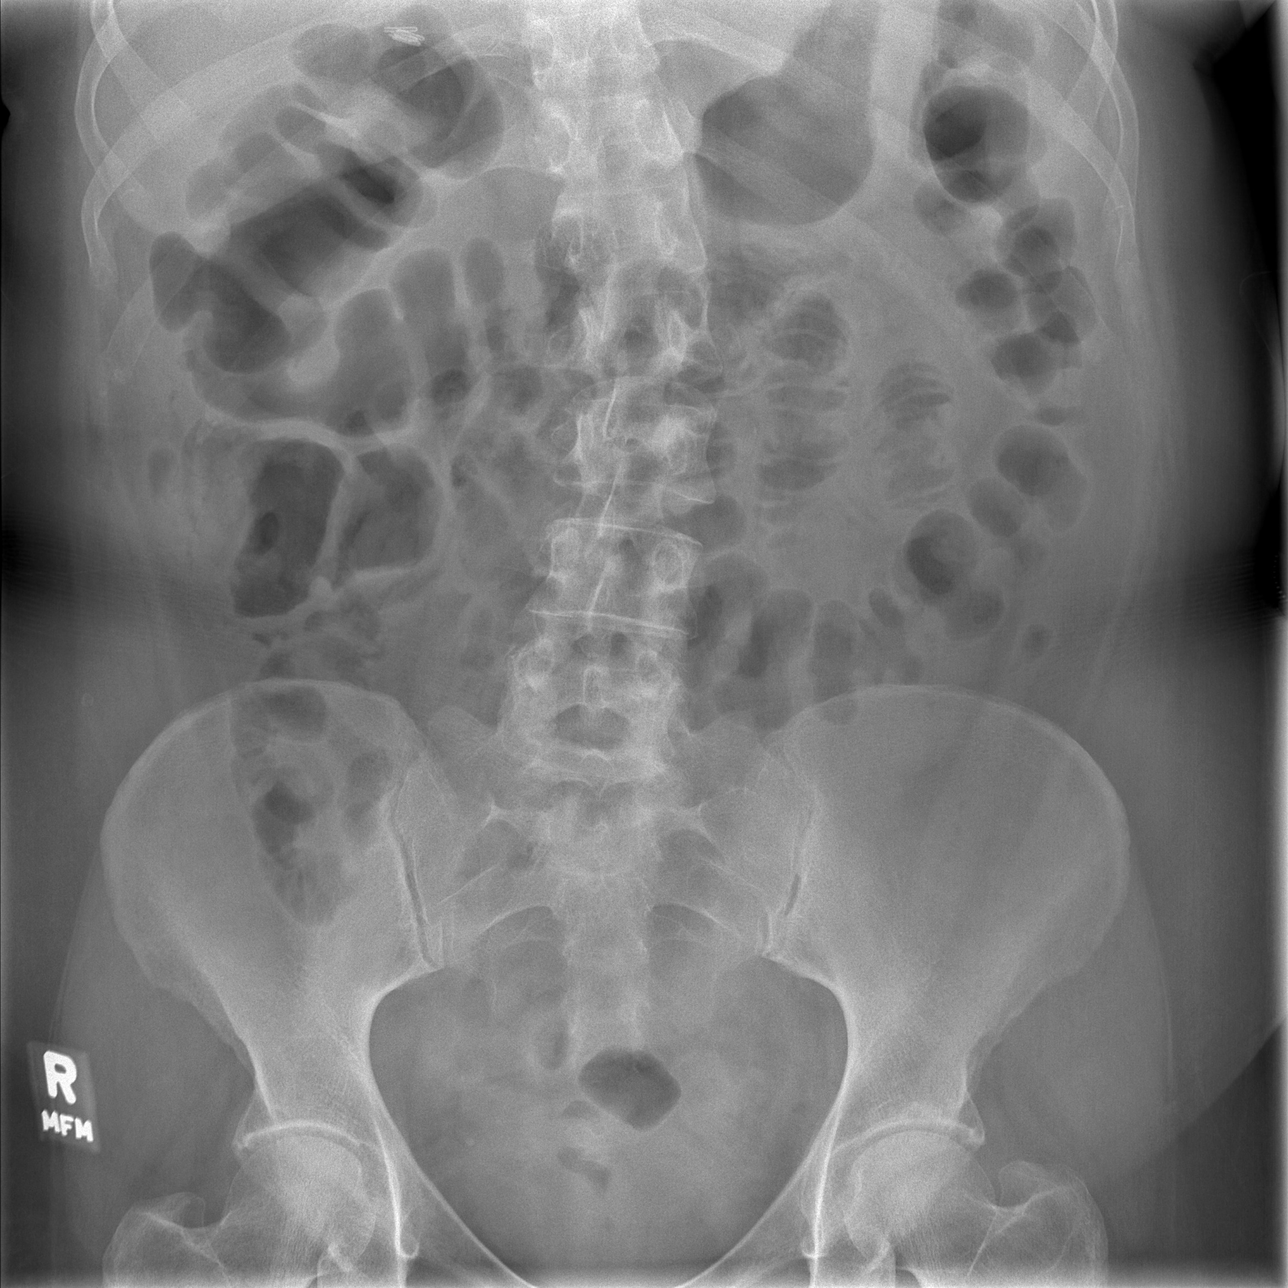

[3 of 3 positions shown; findings below may reference images not displayed]

FINDINGS: Upright and supine views of the abdomen and pelvis.  No pneumoperitoneum.  Stable surgical clips in the right upper quadrant.  Stable scoliosis.  There is now gas extending throughout nondilated colon to the level of the rectosigmoid colon.  There are gas-filled small bowel loops in the mid abdomen and also the right lower quadrant which are nondilated.  Some loops have slightly prominent valvulae conniventes.  Visualized lung bases are clear.  No acute osseous abnormality.
IMPRESSION: Compatible with interval resolution of small bowel obstruction.  Non-obstructed pattern and no pneumoperitoneum.

## 2009-05-06 IMAGING — CR DG ABDOMEN ACUTE W/ 1V CHEST
3 series · 3 of 3 positions shown · non-contrast
Comparison: none

CLINICAL DATA: Vomiting. Abdominal  pain. 
ACUTE ABDOMINAL SERIES:

[w chest pa]
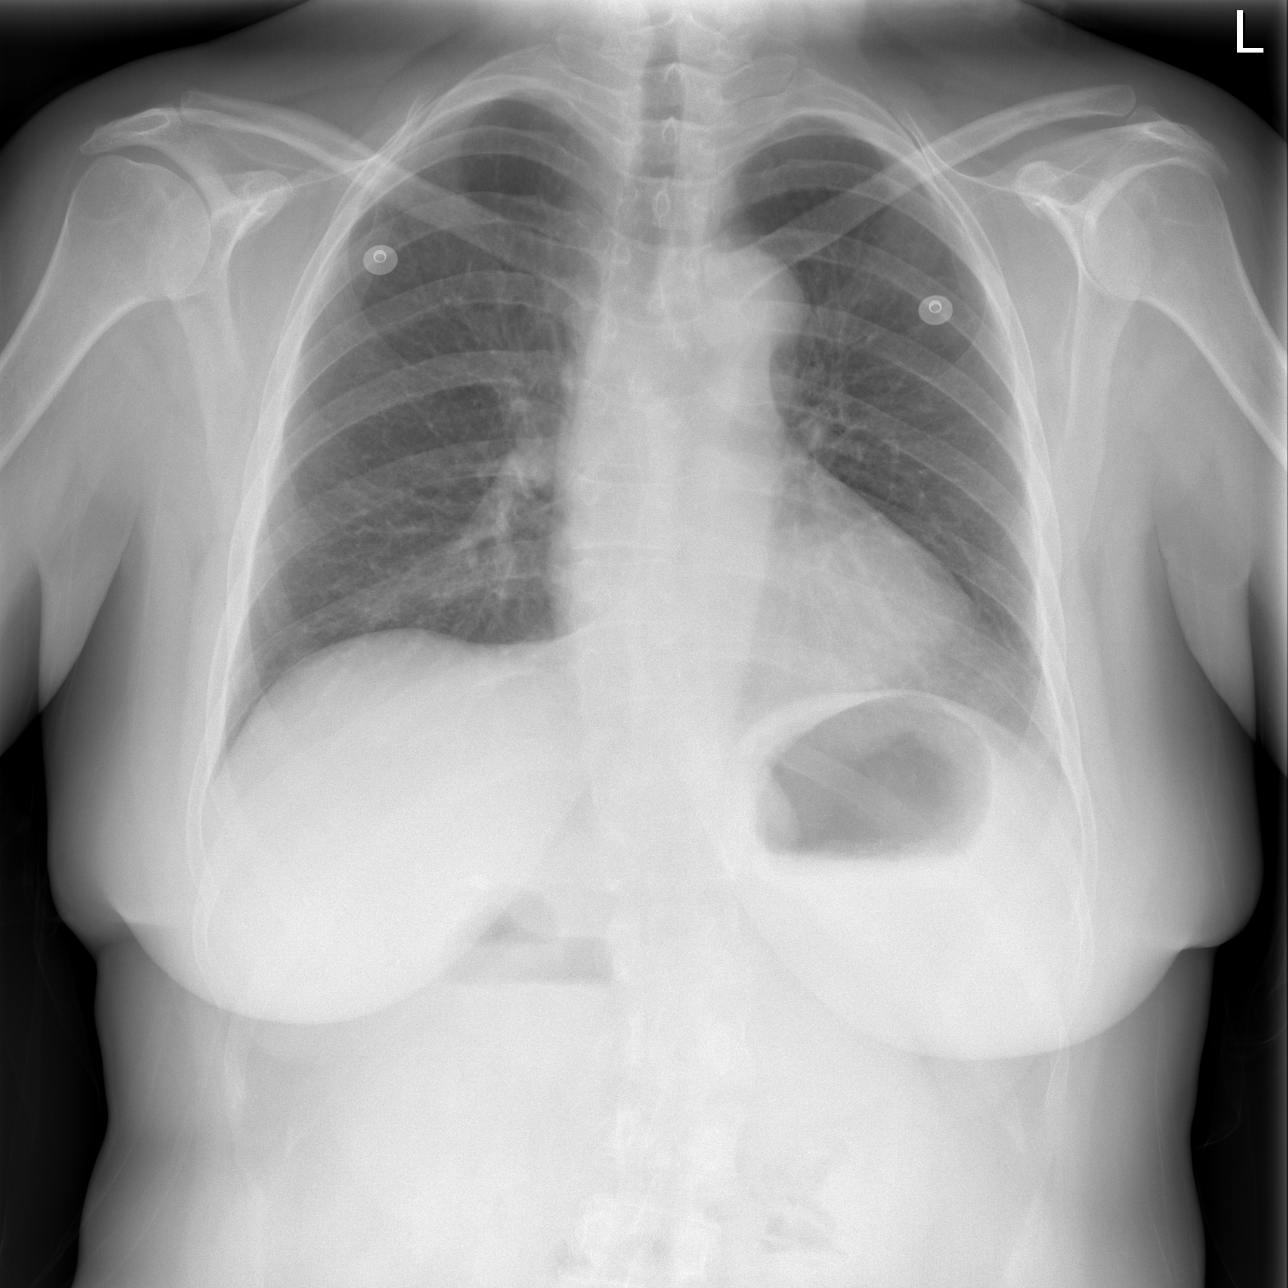

[w abdomen upright *]
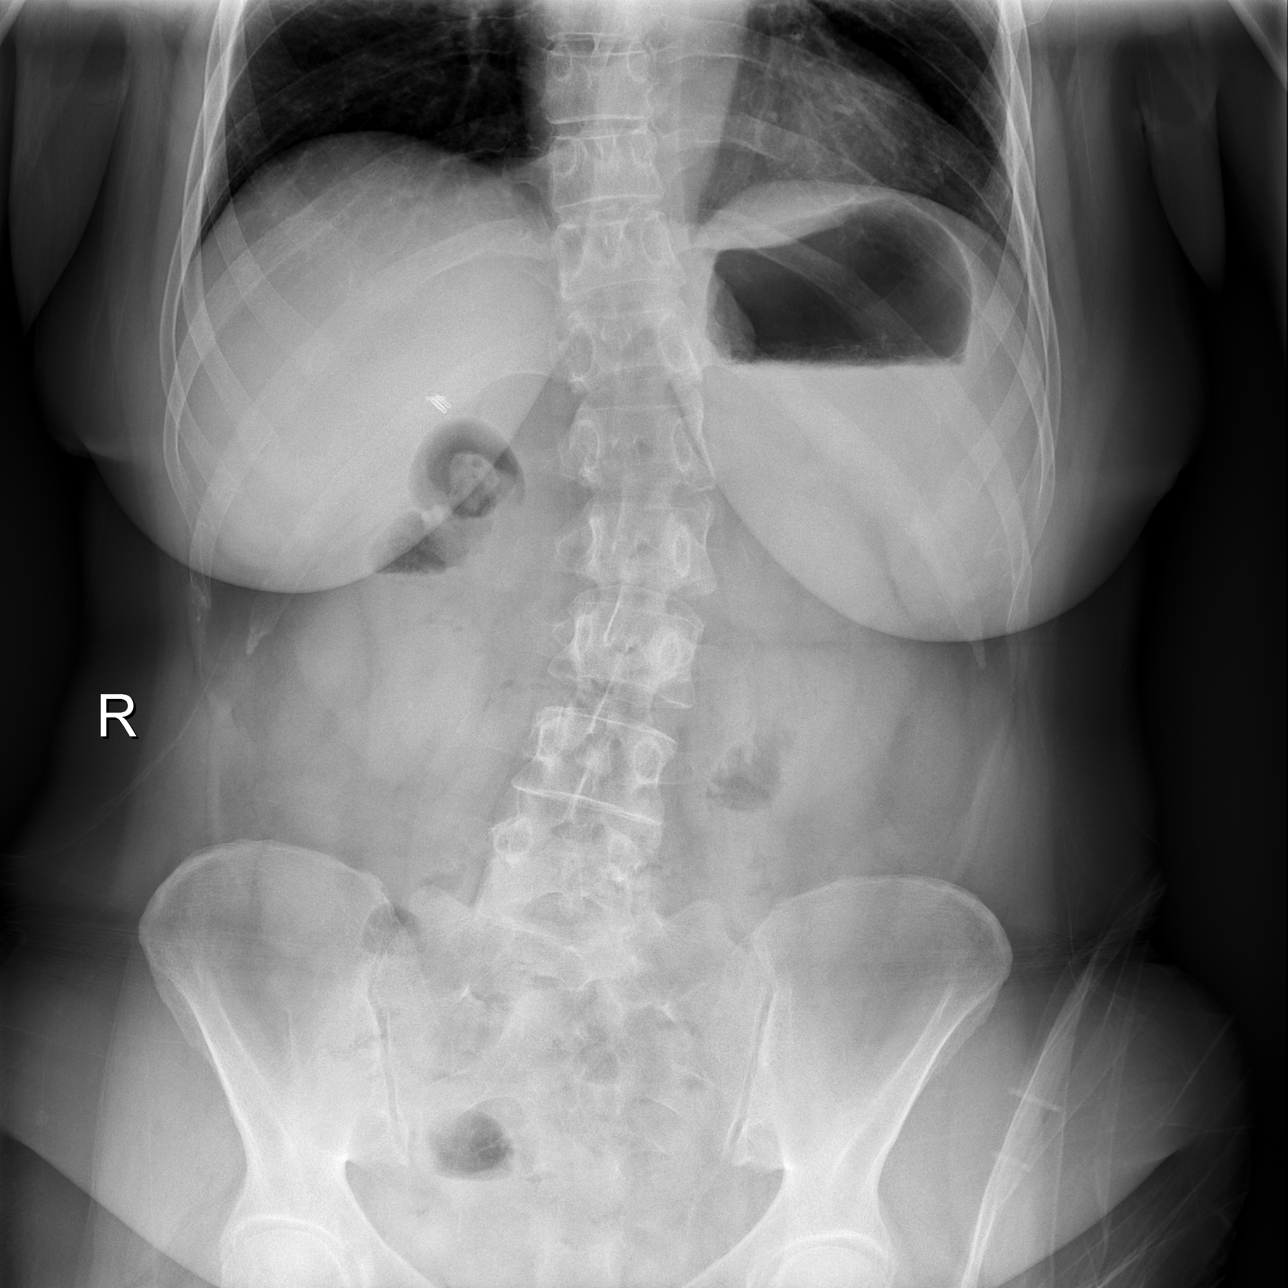

[t abdomen supine]
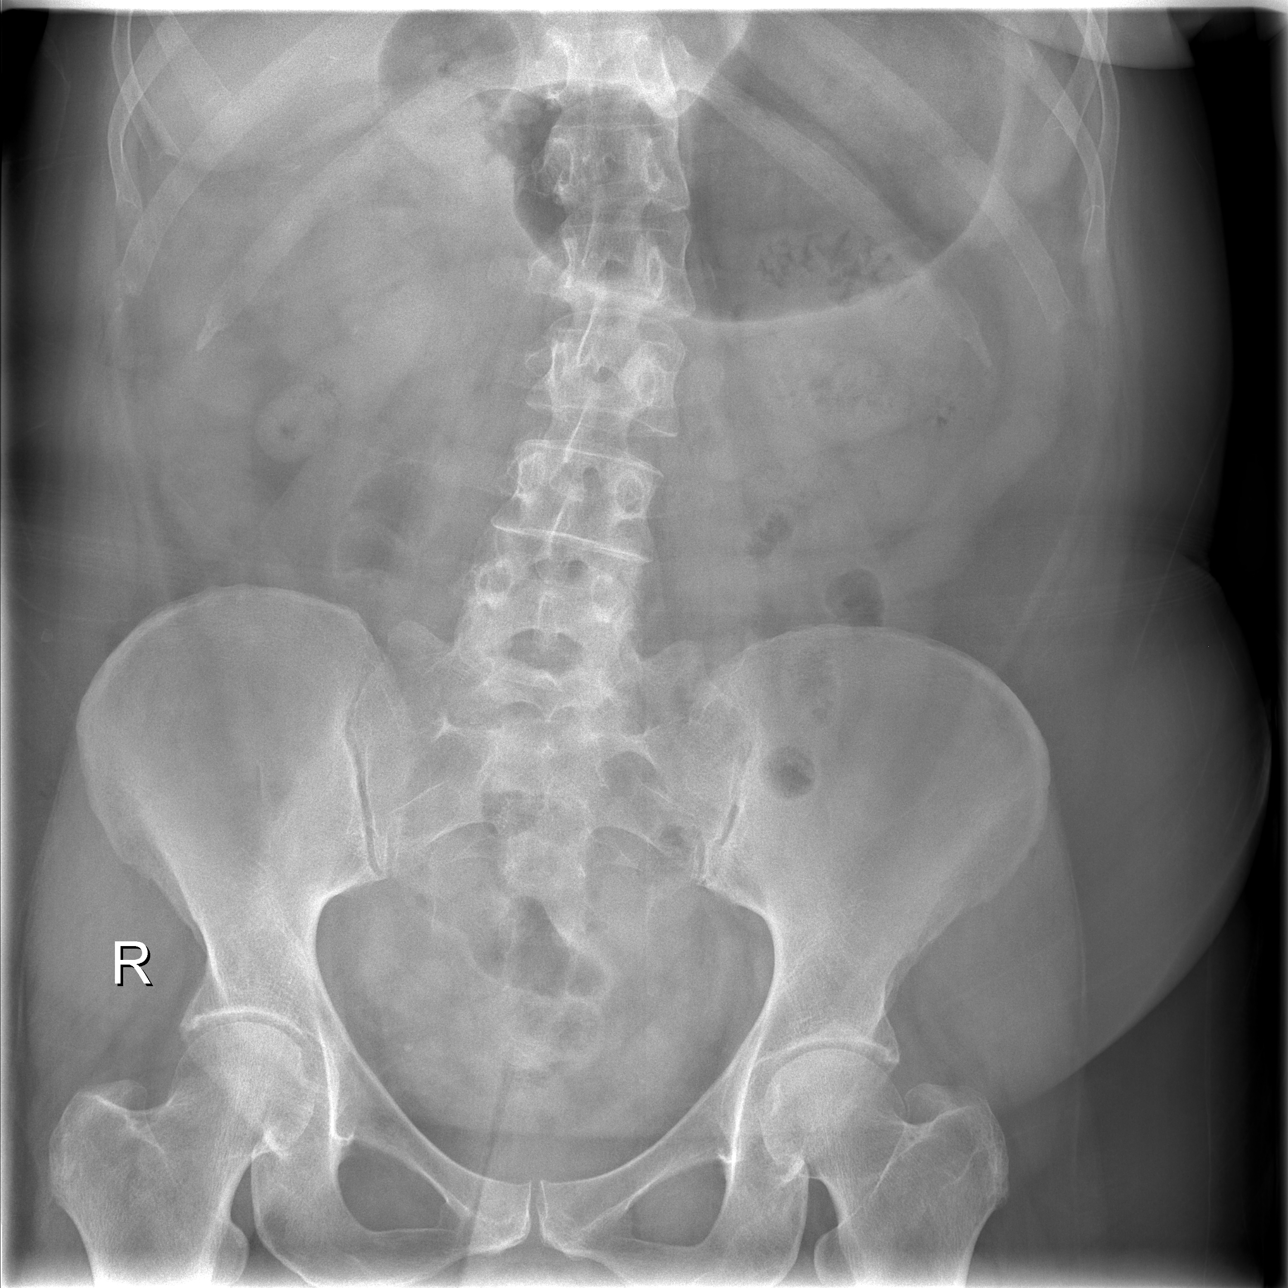

[3 of 3 positions shown; findings below may reference images not displayed]

FINDINGS: Normal mediastinum and cardiac silhouette. Costophrenic angles are clear. Normal pulmonary vasculature. No free air beneath the hemidiaphragms. 
Supine and upright views of the abdomen demonstrate no intraperitoneal free air.  There are no dilated loops of large or small bowel. There is fluid and gas within the stomach and distention of the duodenal bulb.  There is gas within the rectum. 
Cholecystectomy clips.
IMPRESSION: 1.  No evidence of intraperitoneal free air. No evidence of obstruction. 
2.  Gas and fluid within the stomach.

## 2009-05-07 IMAGING — CR DG ABDOMEN ACUTE W/ 1V CHEST
4 series · 4 of 4 positions shown · non-contrast
Comparison: none

CLINICAL DATA: 51-year-old female with severe abdominal pain x 1 week.  Pain has increased from yesterday.
 ACUTE ABDOMINAL SERIES WITH CHEST ? 3 VIEW:

[w chest pa]
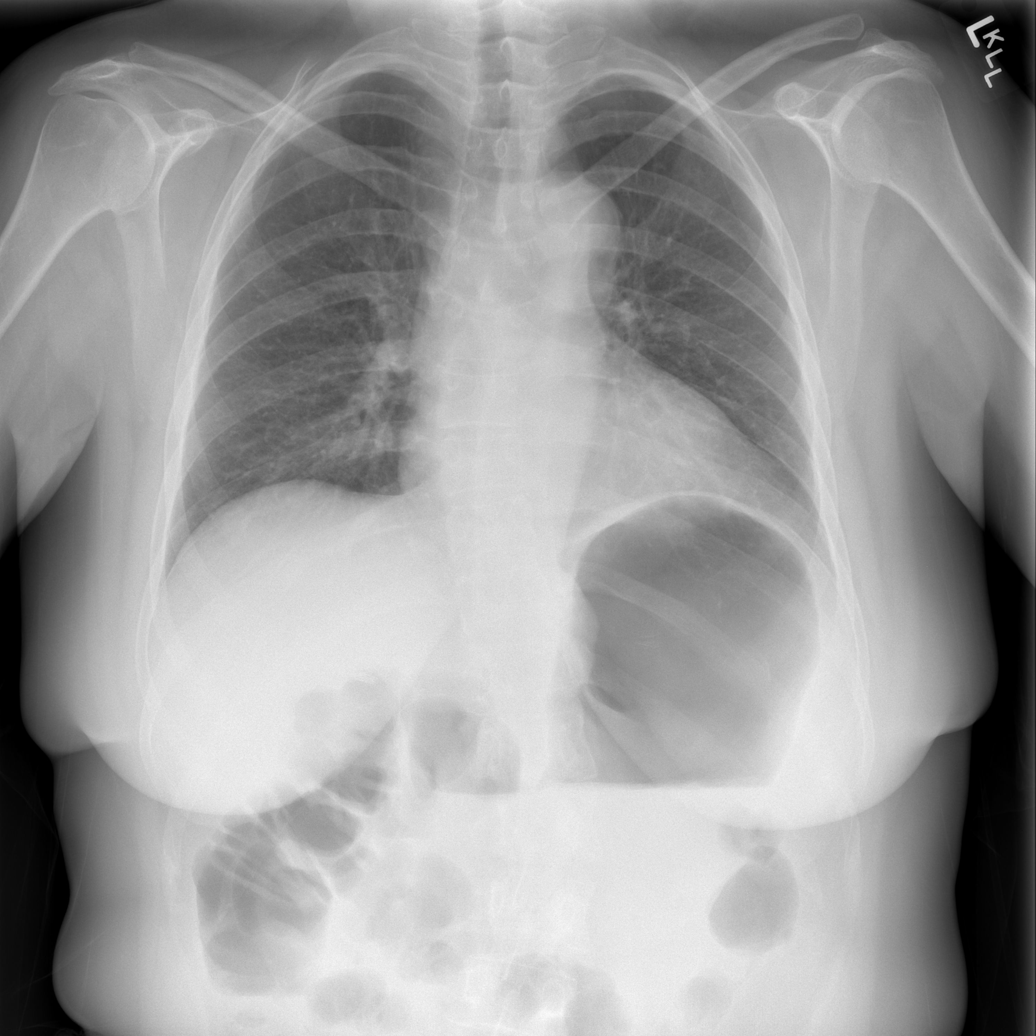

[w abdomen upright *]
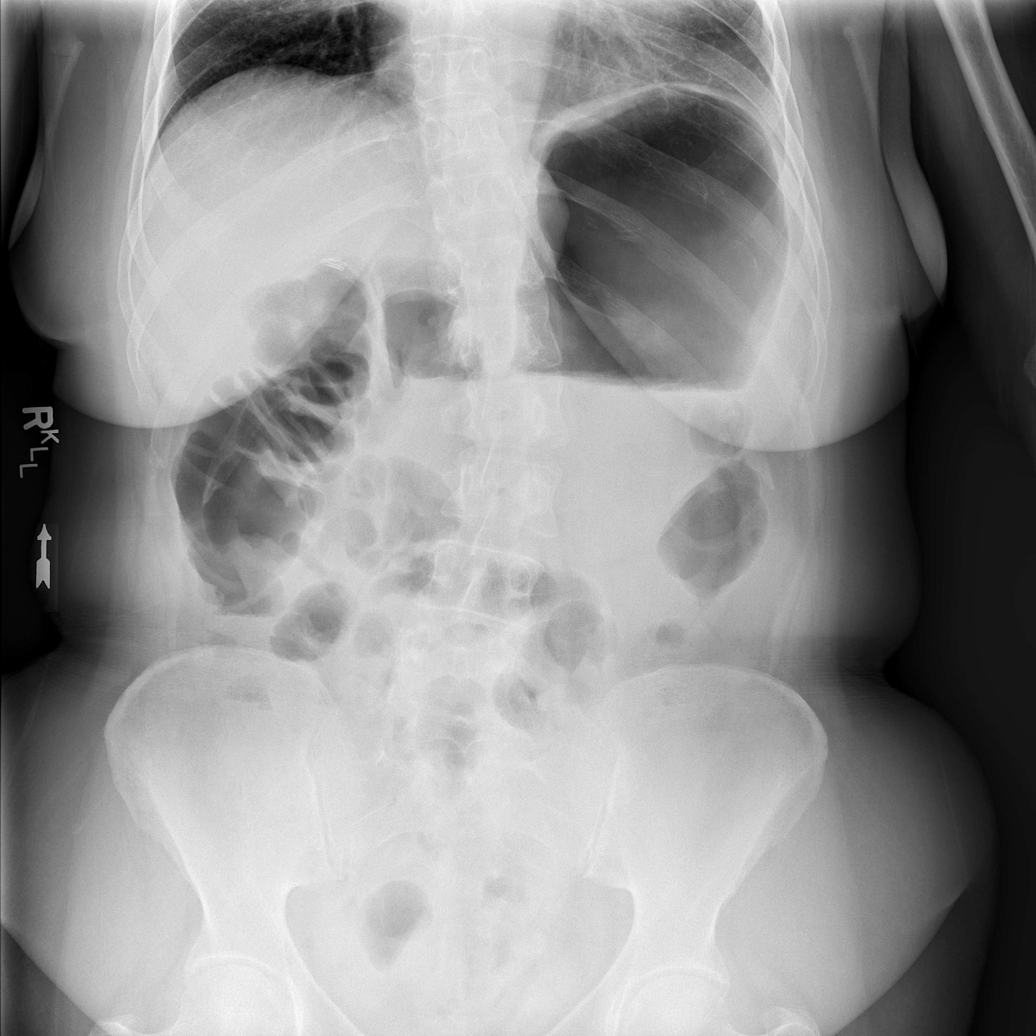

[t abdomen supine (1 of 2)]
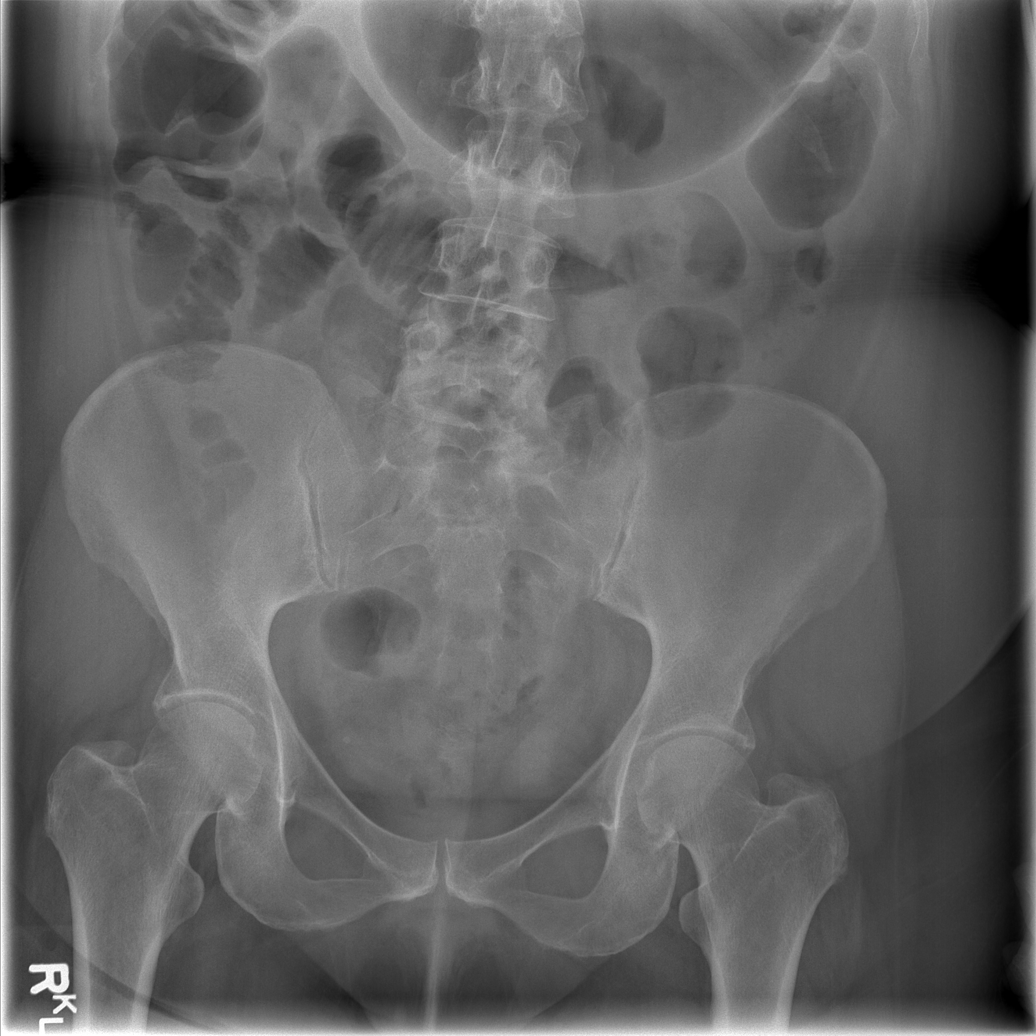

[t abdomen supine (2 of 2)]
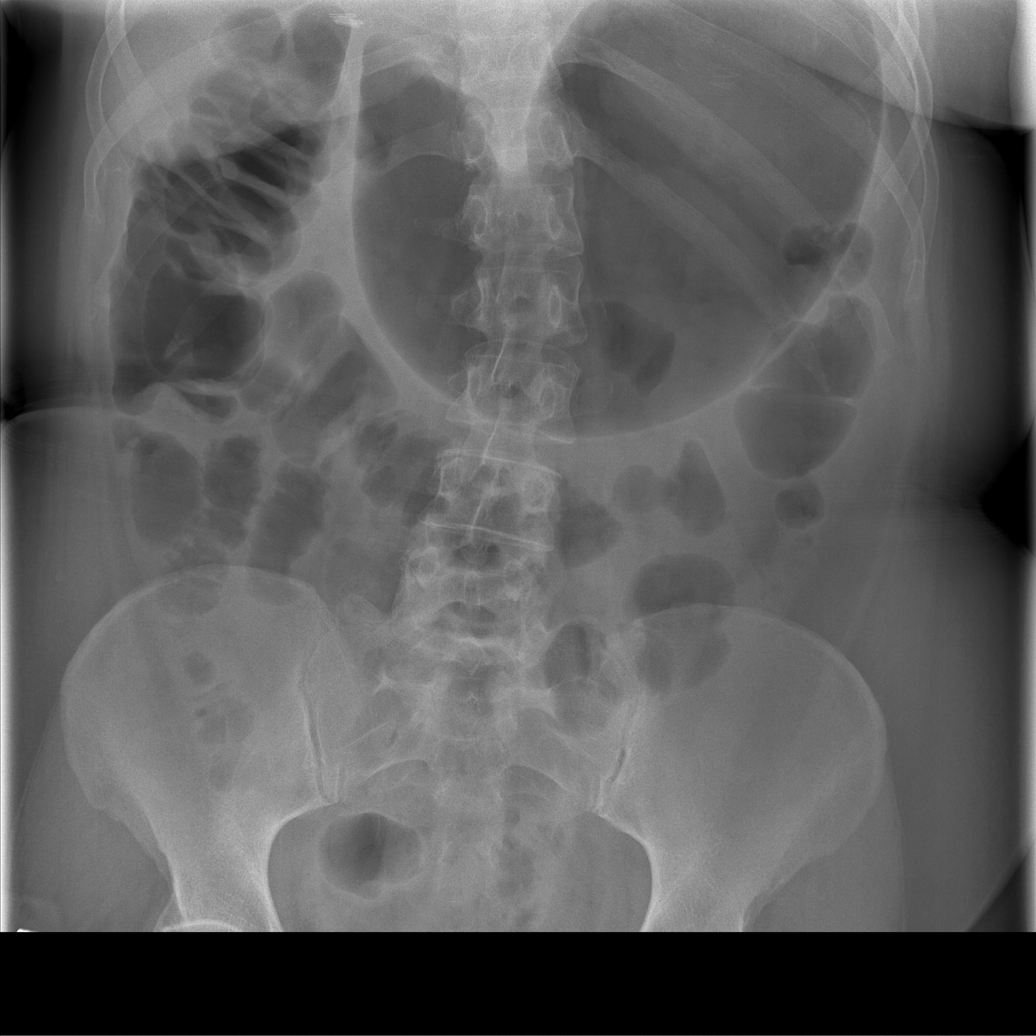

[4 of 4 positions shown; findings below may reference images not displayed]

FINDINGS: Single view of the chest demonstrates no pneumoperitoneum.  The stomach is distended with gas and fluid.  Stable, slightly shallow lung inflation.  The lungs remain clear.  Stable cardiac size and mediastinal contour.  Stable scoliosis. 
 The stomach is moderate to severely distended with gas.  There is gas and scattered small bowel loops in the colon, including the transverse colon.  Small amount of rectal gas is noted.  The amount of bowel gas is increased since yesterday, but no differential dilatation of small or large bowel loops is noted.
IMPRESSION: 1. Stomach is moderate to severely distended with gas and fluid.  Consider NG tube placement.
 2. Non-obstructed small and large bowel gas pattern.  No free air.    
 3. No acute cardiopulmonary abnormality.

## 2009-05-08 IMAGING — CT CT PELVIS W/ CM
2 of 5 series · 17 of 46 positions shown, 19 images · IV contrast (APPLIED)
Comparison: Abdominal and pelvic CTs 02/06/07.

CLINICAL DATA: Abdominal pain with nausea and vomiting.  Diarrhea.  Question obstruction.  
ABDOMEN CT WITH CONTRAST:
TECHNIQUE: Multidetector CT imaging of the abdomen was performed following the standard protocol during bolus administration of intravenous contrast.
Contrast:  125 cc Omnipaque 300.  Oral contrast was given.
TECHNIQUE: Multidetector CT imaging of the pelvis was performed following the standard protocol without IV contrast.

[Series 2: abd_pel 5.0 b40f st · axial · 0.64mm/px · z∈[-184,+261]mm · 14 of 99 slices shown, 16 images]
[im 5/99  soft-tissue]
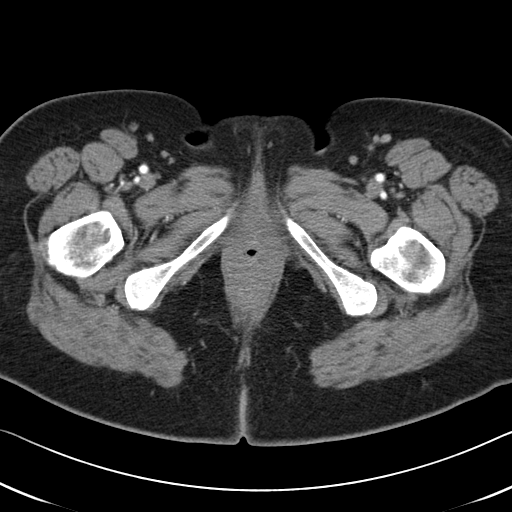
[im 5/99  bone]
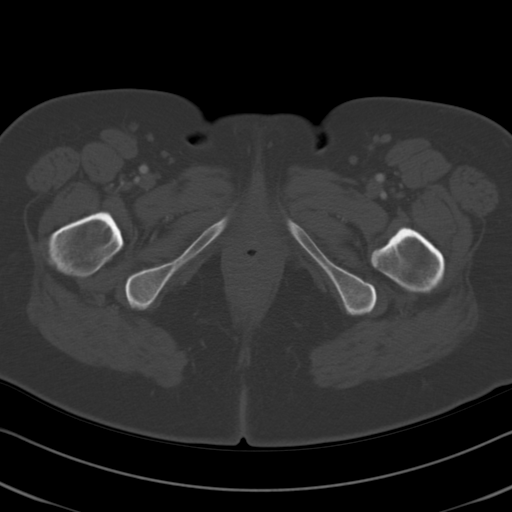
[im 15/99  soft-tissue]
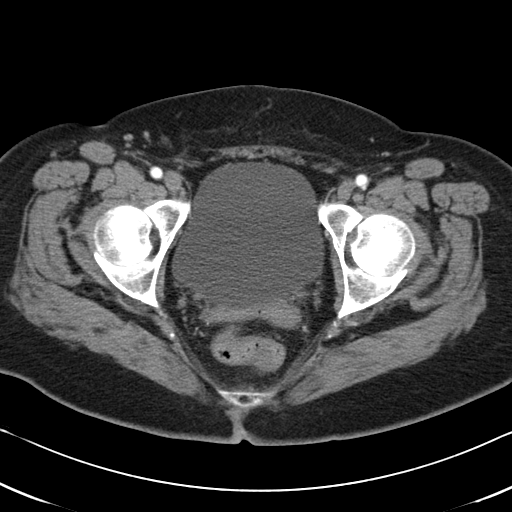
[im 20/99  soft-tissue]
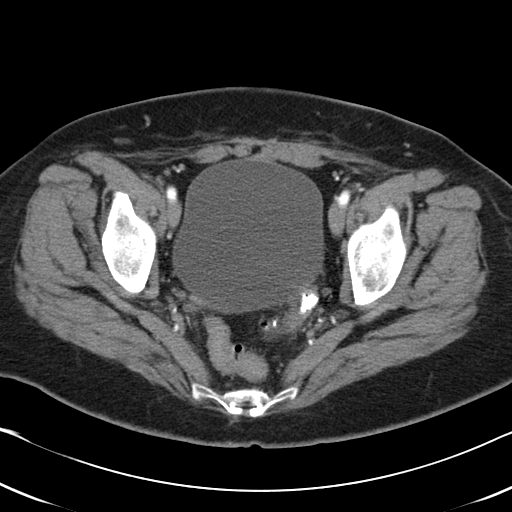
[im 25/99  soft-tissue]
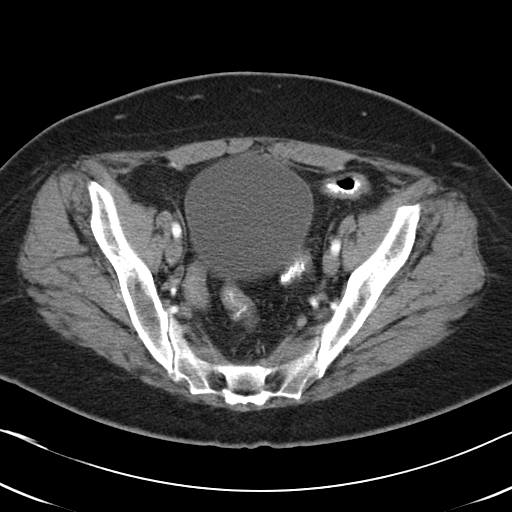
[im 35/99  soft-tissue]
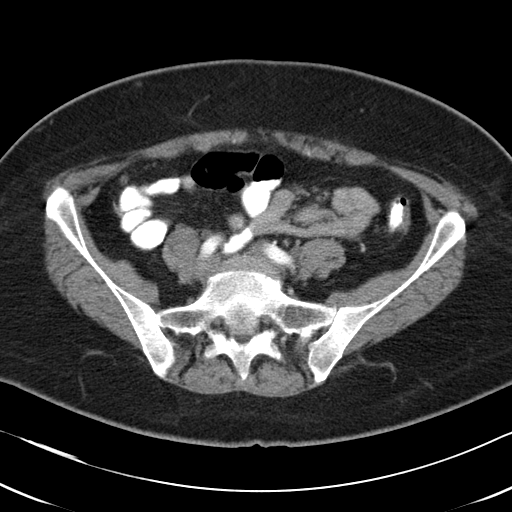
[im 40/99  soft-tissue]
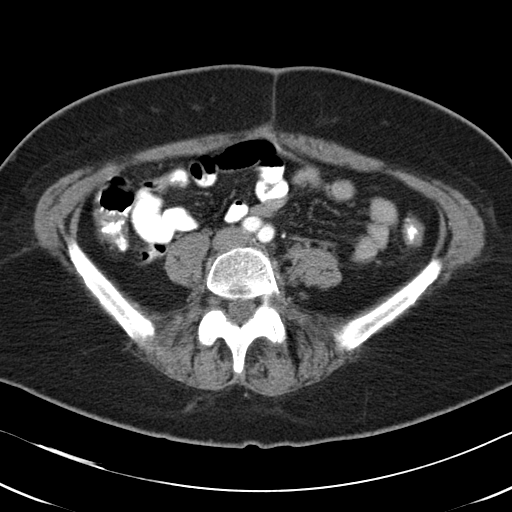
[im 45/99  soft-tissue]
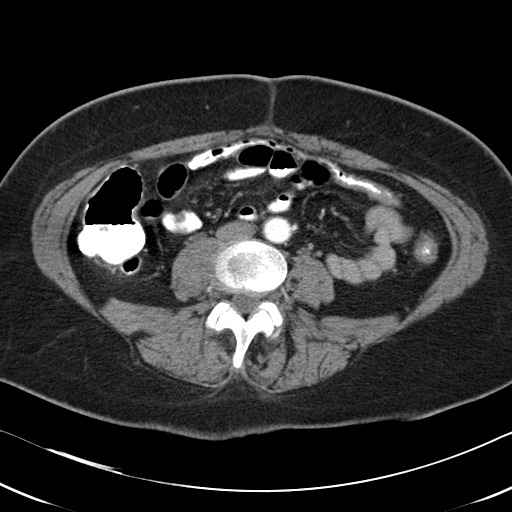
[im 54/99  soft-tissue]
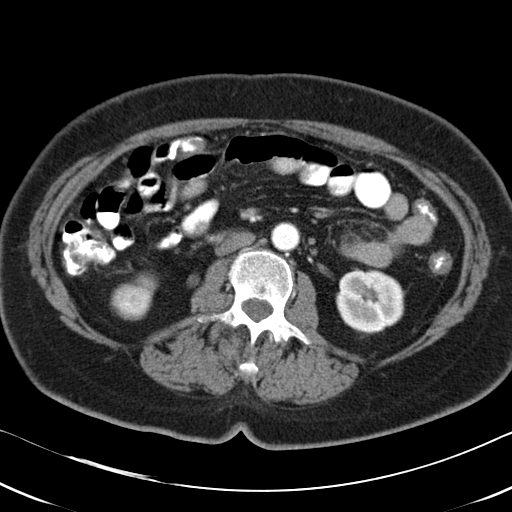
[im 59/99  soft-tissue]
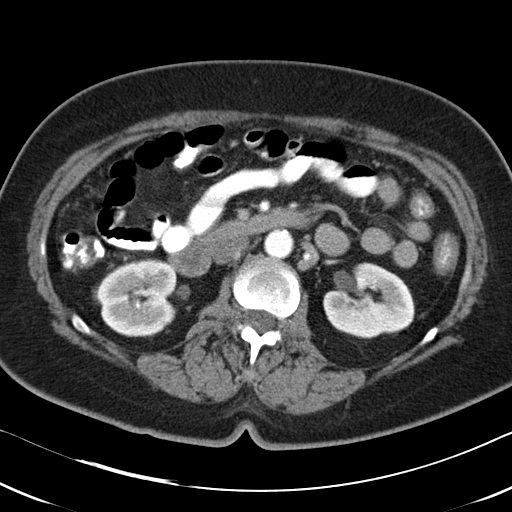
[im 59/99  bone]
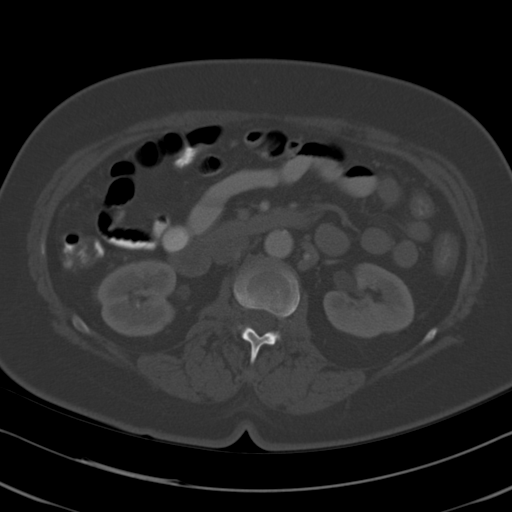
[im 64/99  soft-tissue]
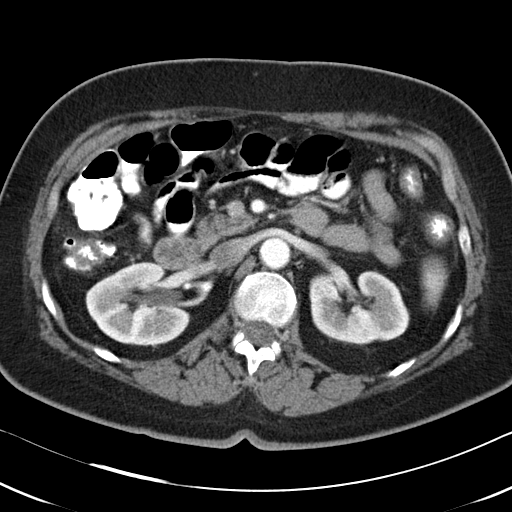
[im 74/99  soft-tissue]
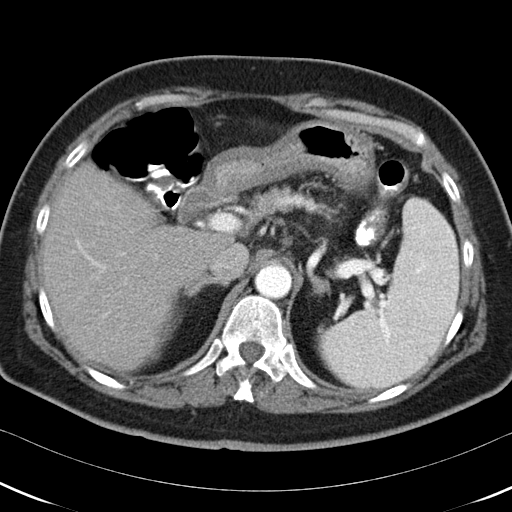
[im 79/99  soft-tissue]
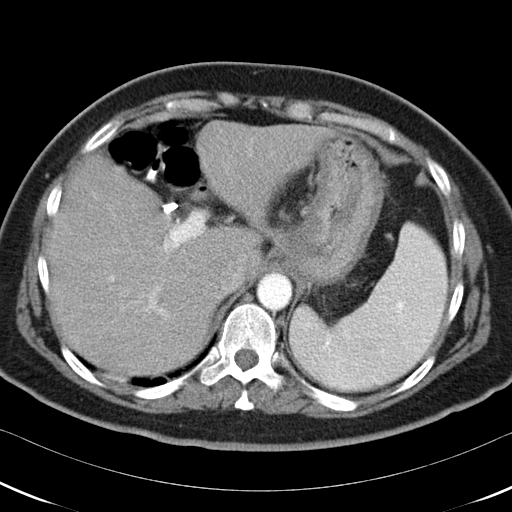
[im 84/99  soft-tissue]
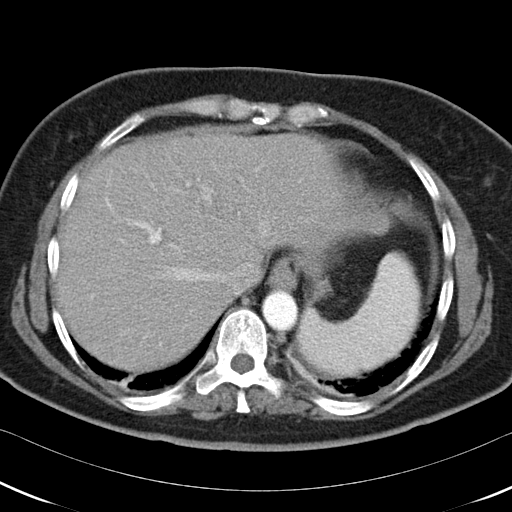
[im 94/99  soft-tissue]
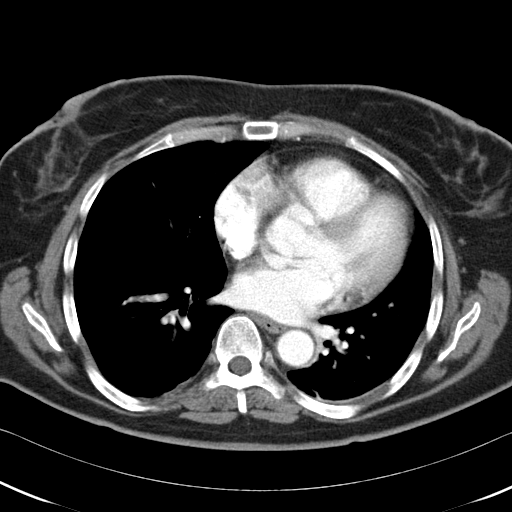

[Series 602: coronal · coronal · 0.99mm/px · 3 of 76 slices shown]
[im 26/76  soft-tissue]
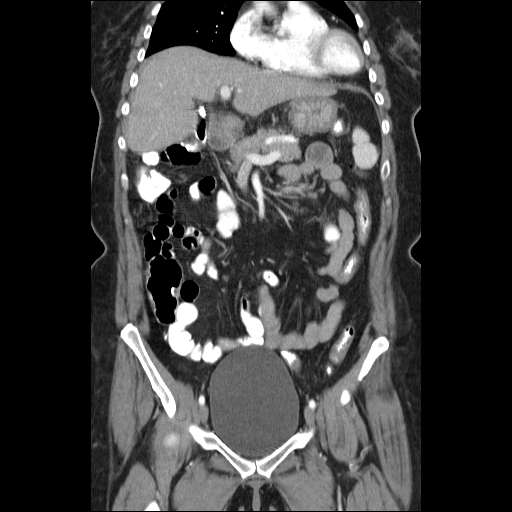
[im 34/76  soft-tissue]
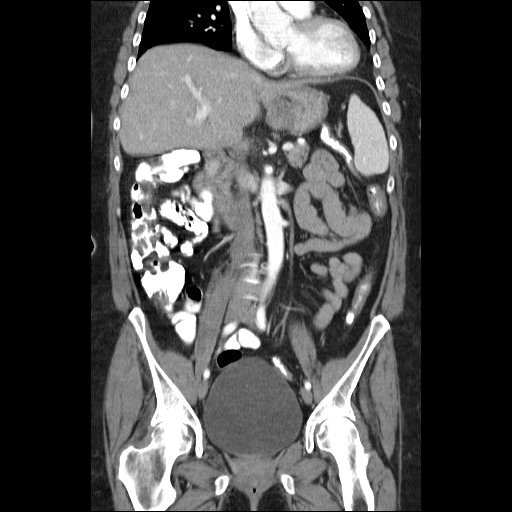
[im 42/76  soft-tissue]
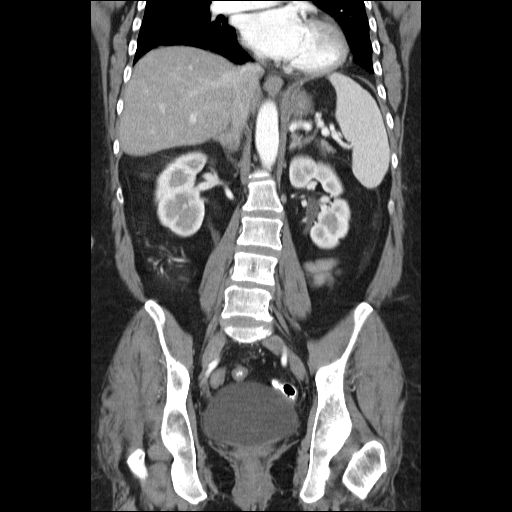

[17 of 46 positions shown; findings below may reference images not displayed]

FINDINGS: There is mild atelectasis at both lung bases.  There is no significant pleural effusion.  The gallbladder is surgically absent.  There is no biliary dilatation.  The liver, spleen, pancreas, and adrenal glands appear normal.  The kidneys appear stable with probable tiny cysts.  There may be a tiny, nonobstructing calculus in the lower pole of the right kidney on image 41. 
There is no longer any small bowel distention.  There is no bowel wall thickening or extraluminal fluid collection.  No enlarged lymph nodes are seen.
IMPRESSION: No evidence of bowel obstruction or other acute process.  Possible tiny nonobstructing calculus in the lower pole of the right kidney.  
PELVIS CT WITHOUT CONTRAST:
FINDINGS: No pelvic mass, fluid collection, or inflammatory process is demonstrated.  The uterus is surgically absent.
IMPRESSION: 1.  Stable examination.  No acute findings. 
2.  As noted on the last examination, the patient has had multiple CT examinations of the abdomen and pelvis over the last 16 months.  Radiation exposure should be considered before any additional CT follow-up is contemplated.

## 2009-06-11 IMAGING — CR DG ABDOMEN ACUTE W/ 1V CHEST
4 series · 4 of 4 positions shown · non-contrast
Comparison: Radiographs 06/14/07 and abdominal and pelvic CT 06/15/07.

CLINICAL DATA: Abdominal pain with nausea and vomiting.
ACUTE ABDOMINAL SERIES WITH CHEST ? 3 VIEW:

[w chest pa]
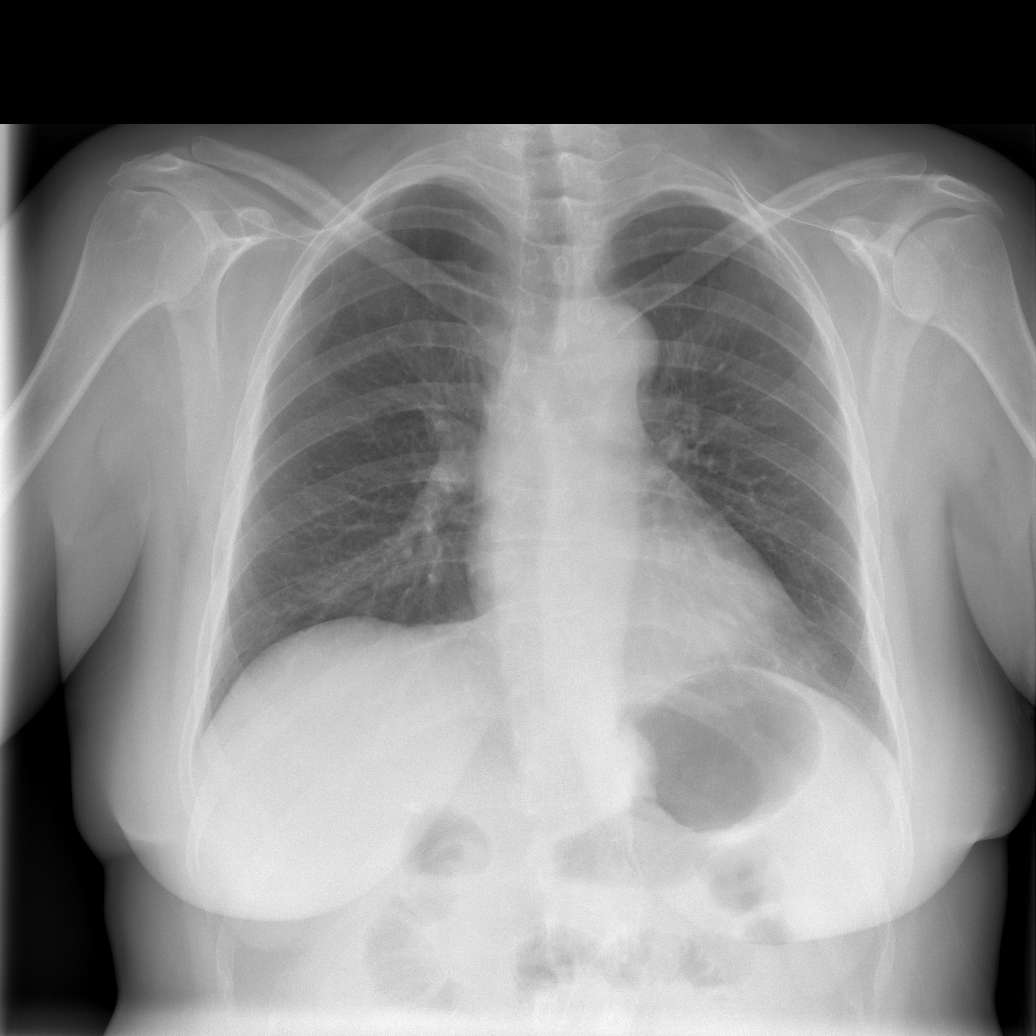

[w abdomen upright *]
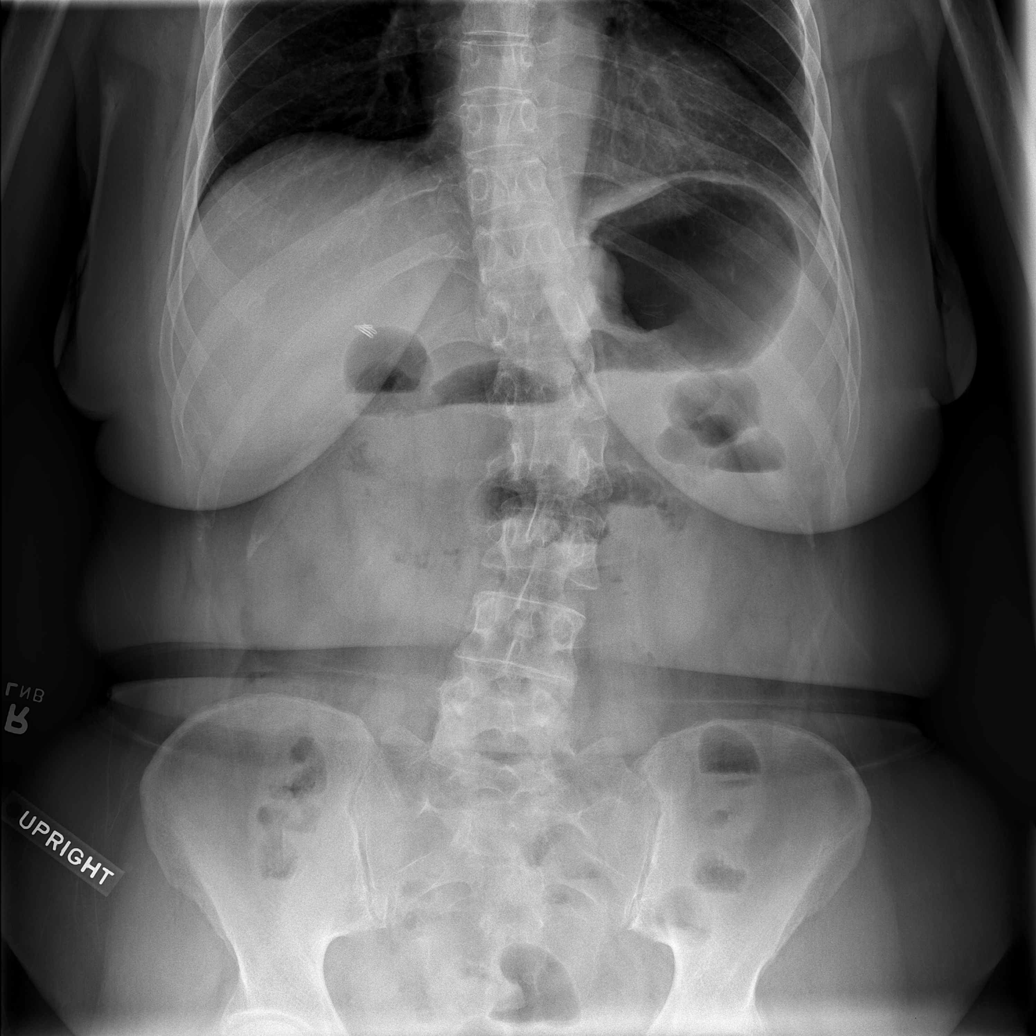

[t abdomen supine (1 of 2)]
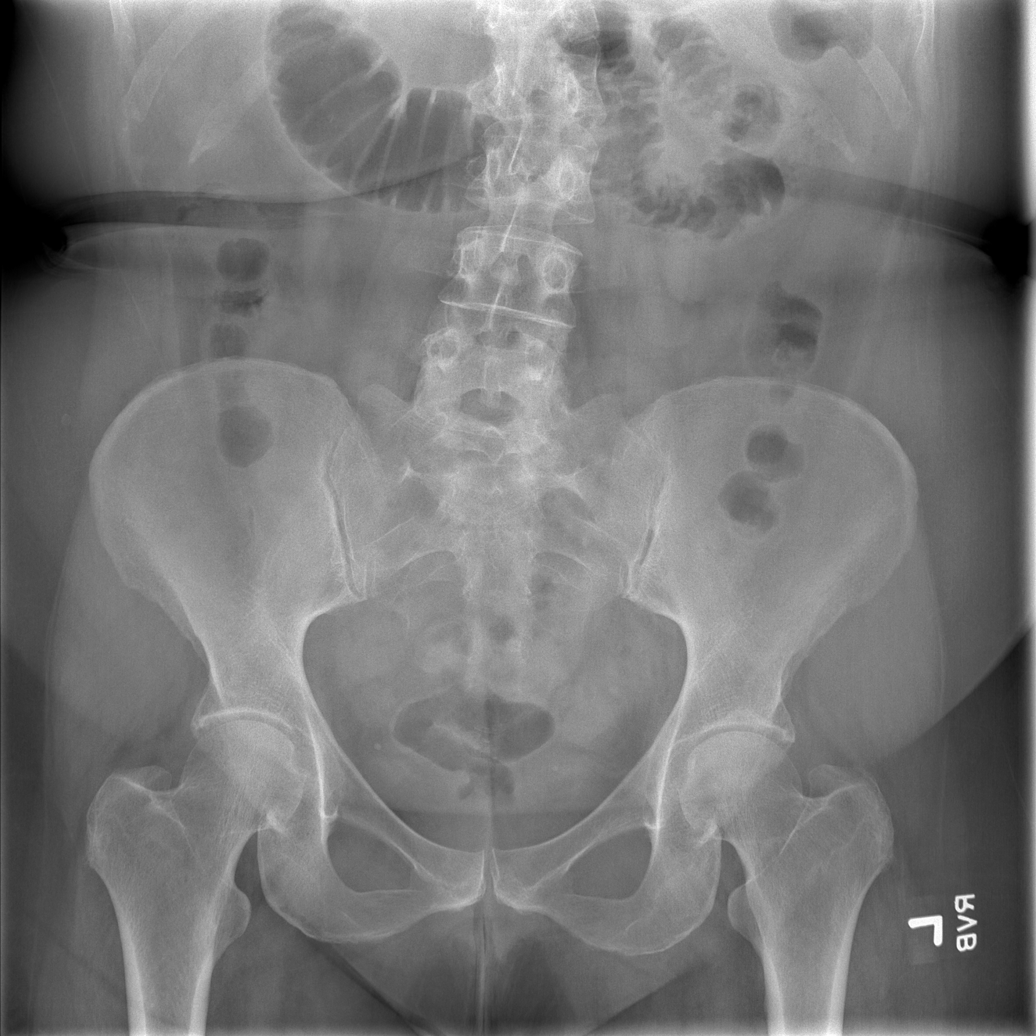

[t abdomen supine (2 of 2)]
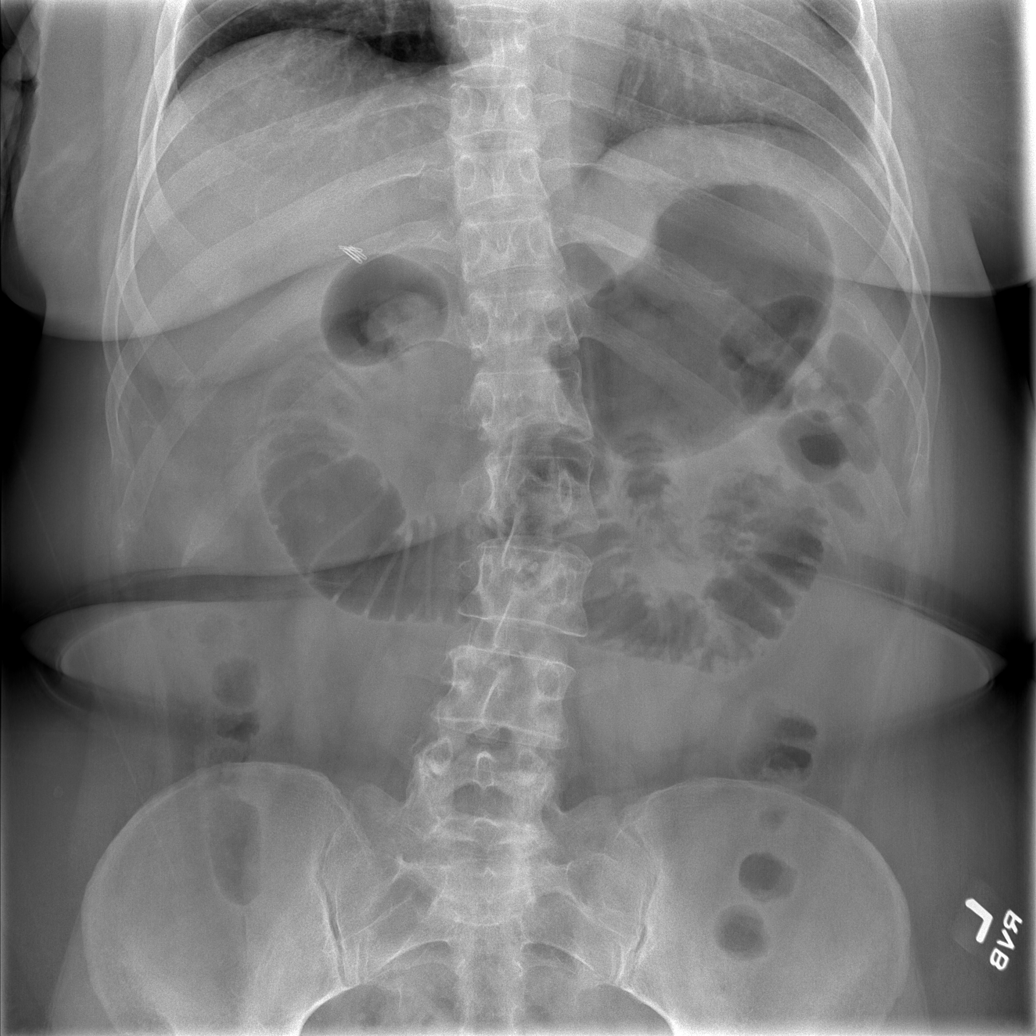

[4 of 4 positions shown; findings below may reference images not displayed]

FINDINGS: Frontal chest radiographs demonstrates stable cardiomediastinal contours and clear lungs.  There is no pleural effusion.
Supine and erect views of the abdomen demonstrate improved gastric distention compared with the prior radiographs.  There is a mildly dilated loop of small bowel in the right mid abdomen.  This has been intermittently demonstrated on prior examinations.   The pattern appears nonobstructive.  There is no free intraperitoneal air.  Scoliosis and pelvic phleboliths are unchanged.
IMPRESSION: Stable examination with mild prominence of a small bowel loop in the right abdomen.  Similar findings have been demonstrated on prior examinations.  Please note that the patient has had numerous abdominal and pelvic CT(s) over the last 16 months for what sounds like similar symptoms.  If further evaluation of a possible low grade small bowel obstruction is warranted, a small bowel series may be helpful.

## 2009-06-16 IMAGING — CR DG ABDOMEN ACUTE W/ 1V CHEST
3 series · 3 of 3 positions shown · non-contrast
Comparison: 07/19/07.

CLINICAL DATA: Epigastric pain.  Nausea and vomiting.  Diarrhea.  
 ACUTE ABDOMINAL SERIES WITH CHEST ? 3 VIEW:

[w chest pa]
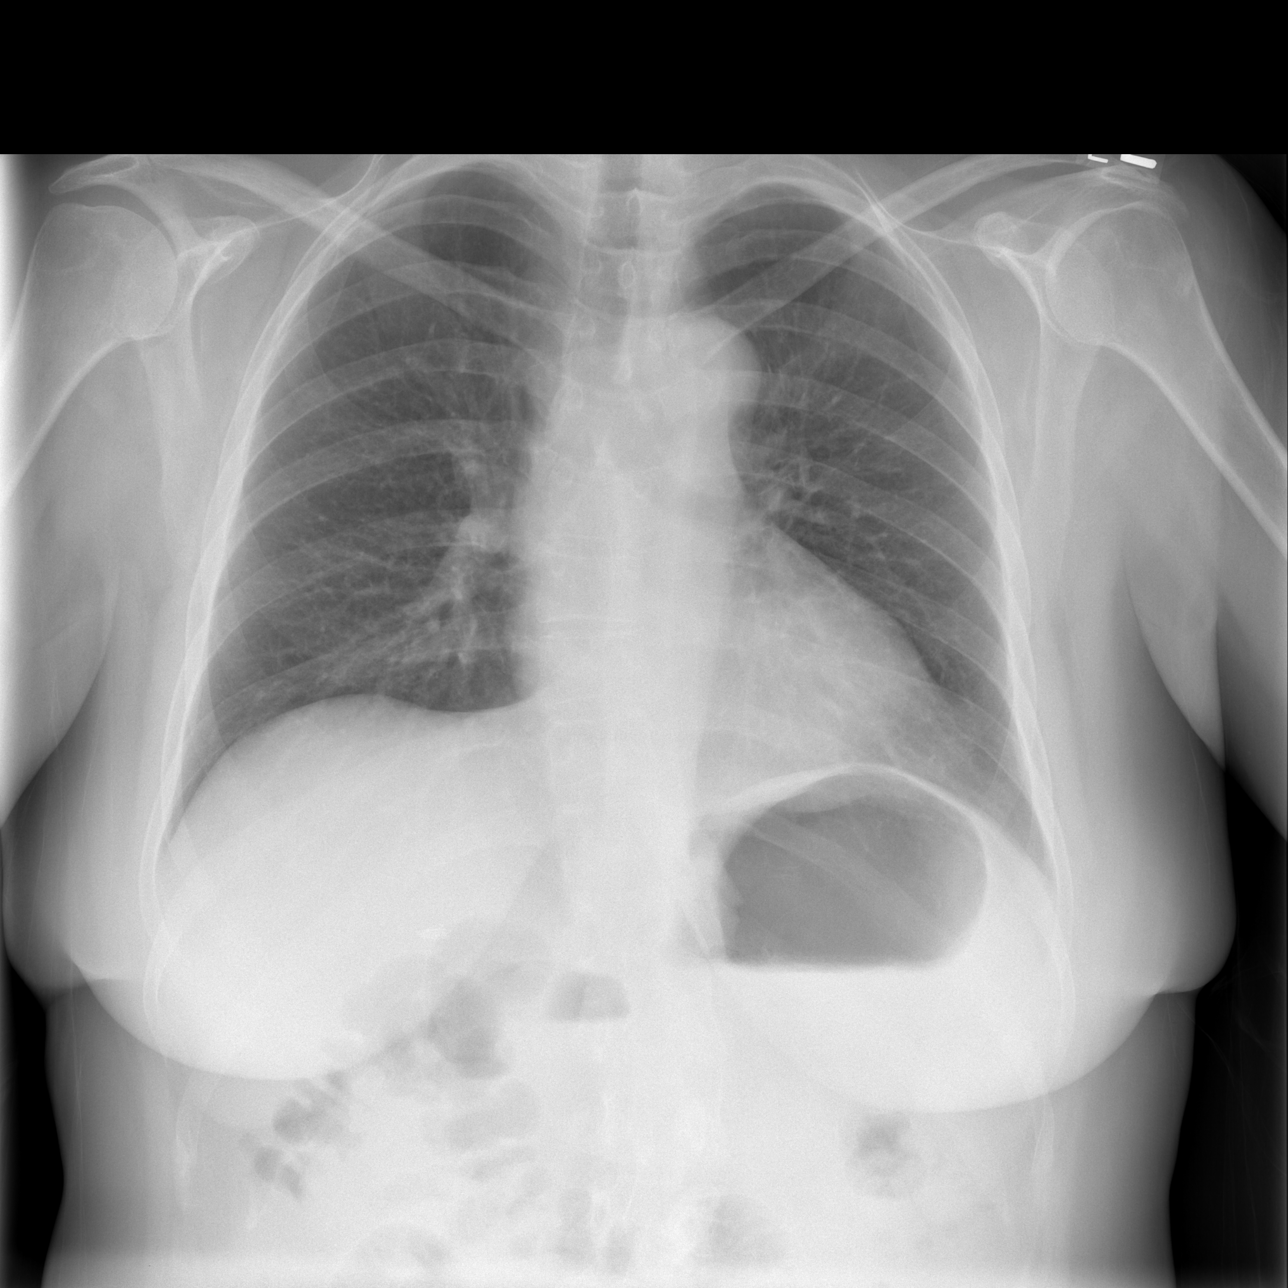

[w abdomen upright *]
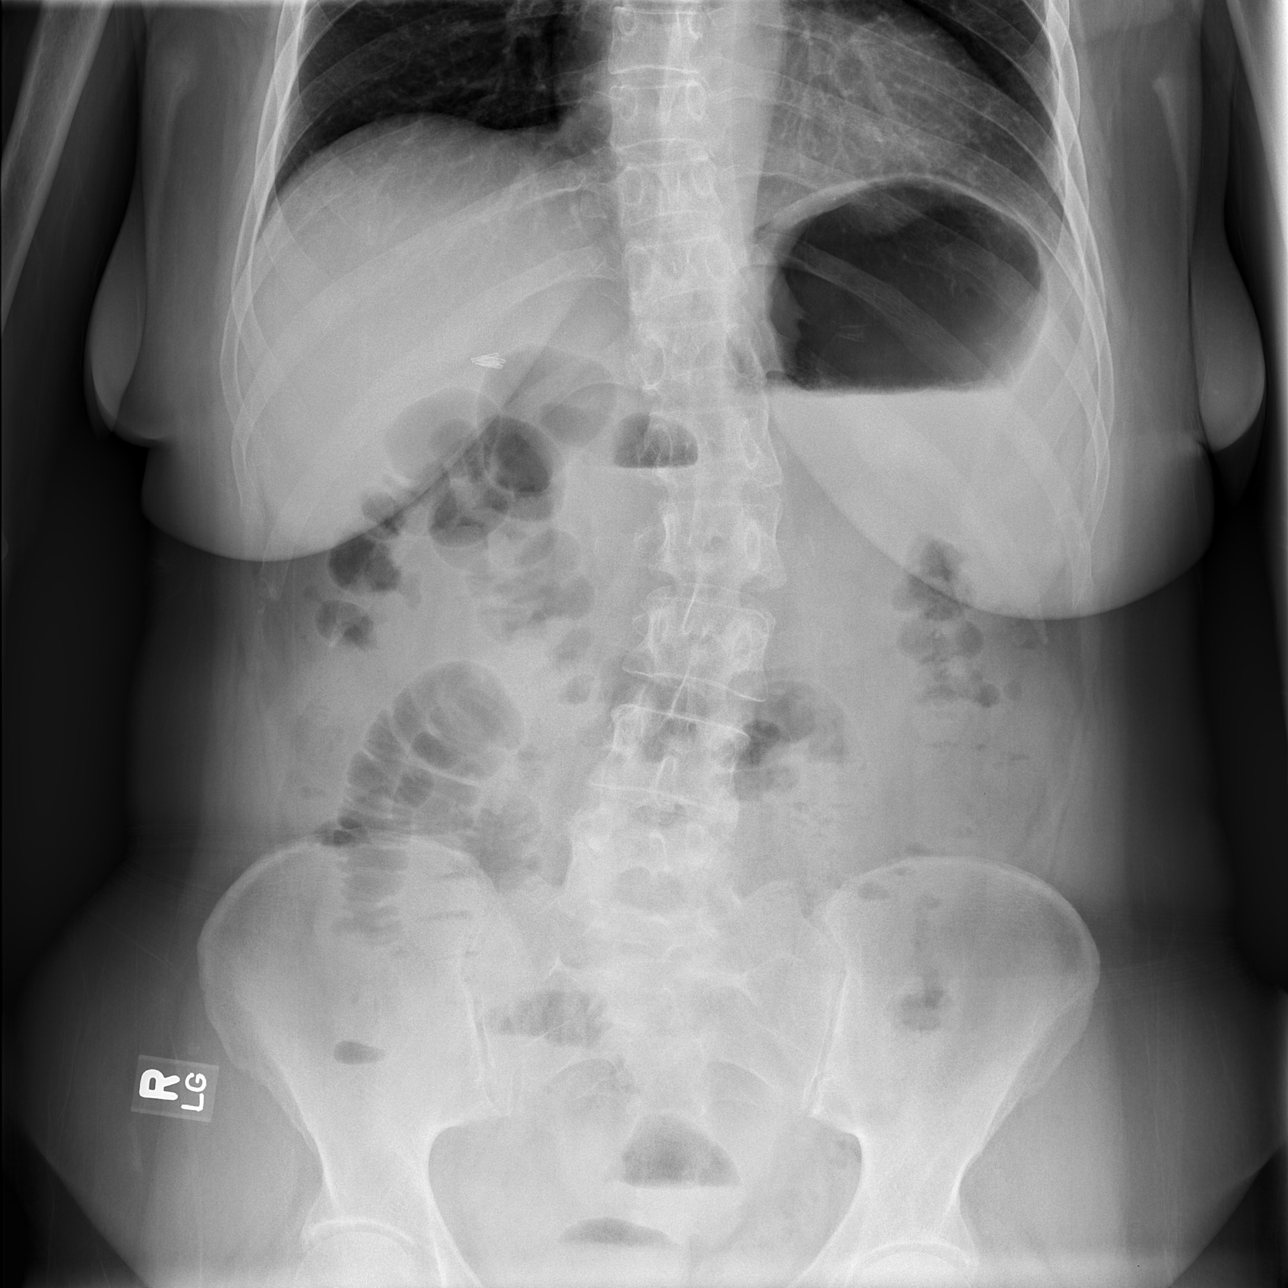

[t abdomen supine]
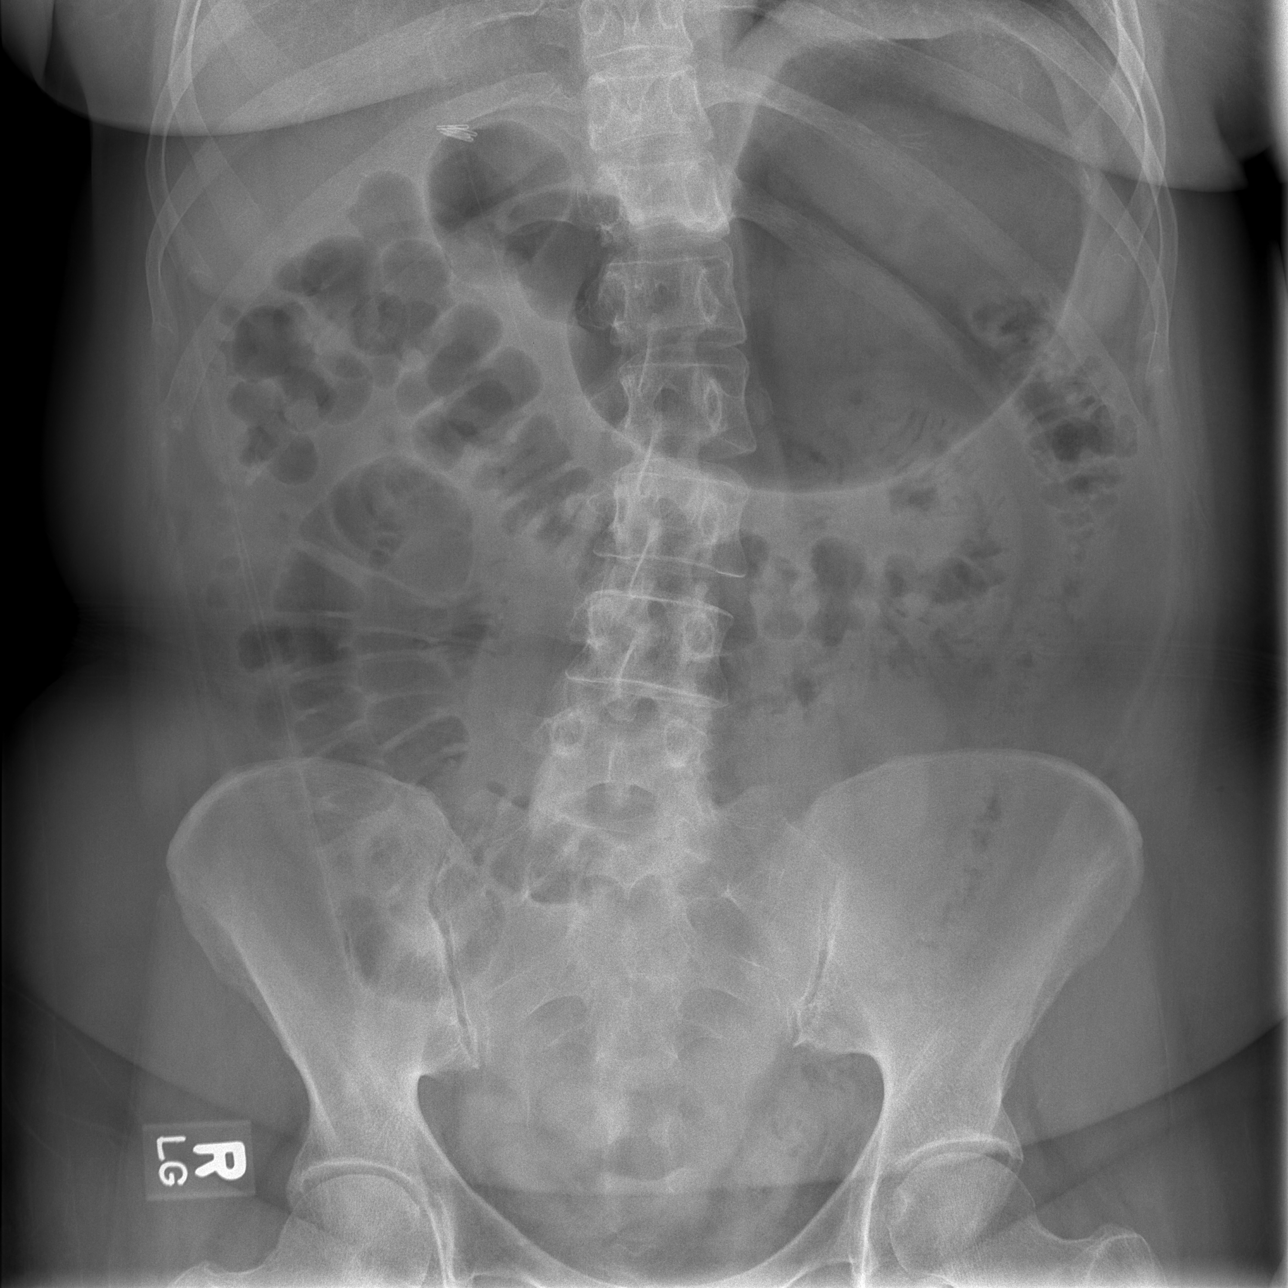

[3 of 3 positions shown; findings below may reference images not displayed]

FINDINGS: Lungs are clear.  Heart size is normal.  No pleural effusion.  
 Two views of the abdomen show no free intraperitoneal air.  Mildly prominent loops of small bowel are noted.  The bowel gas pattern is nonobstructive.  Scoliosis is noted.
IMPRESSION: No acute findings.  Stable exam.

## 2009-06-23 ENCOUNTER — Ambulatory Visit: Payer: Self-pay | Admitting: Physician Assistant

## 2009-07-27 ENCOUNTER — Inpatient Hospital Stay: Payer: Self-pay | Admitting: Internal Medicine

## 2009-07-29 IMAGING — CR DG ABDOMEN ACUTE W/ 1V CHEST
4 series · 4 of 4 positions shown · non-contrast
Comparison: 07/24/2007

Addendum Begins
Addendum: There is a typographical area in the initial impression. The
impression should read
CLINICAL DATA: Nausea, vomiting, abdominal pain

ABDOMEN SERIES - 2 VIEW & CHEST - 1 VIEW

[w chest pa]
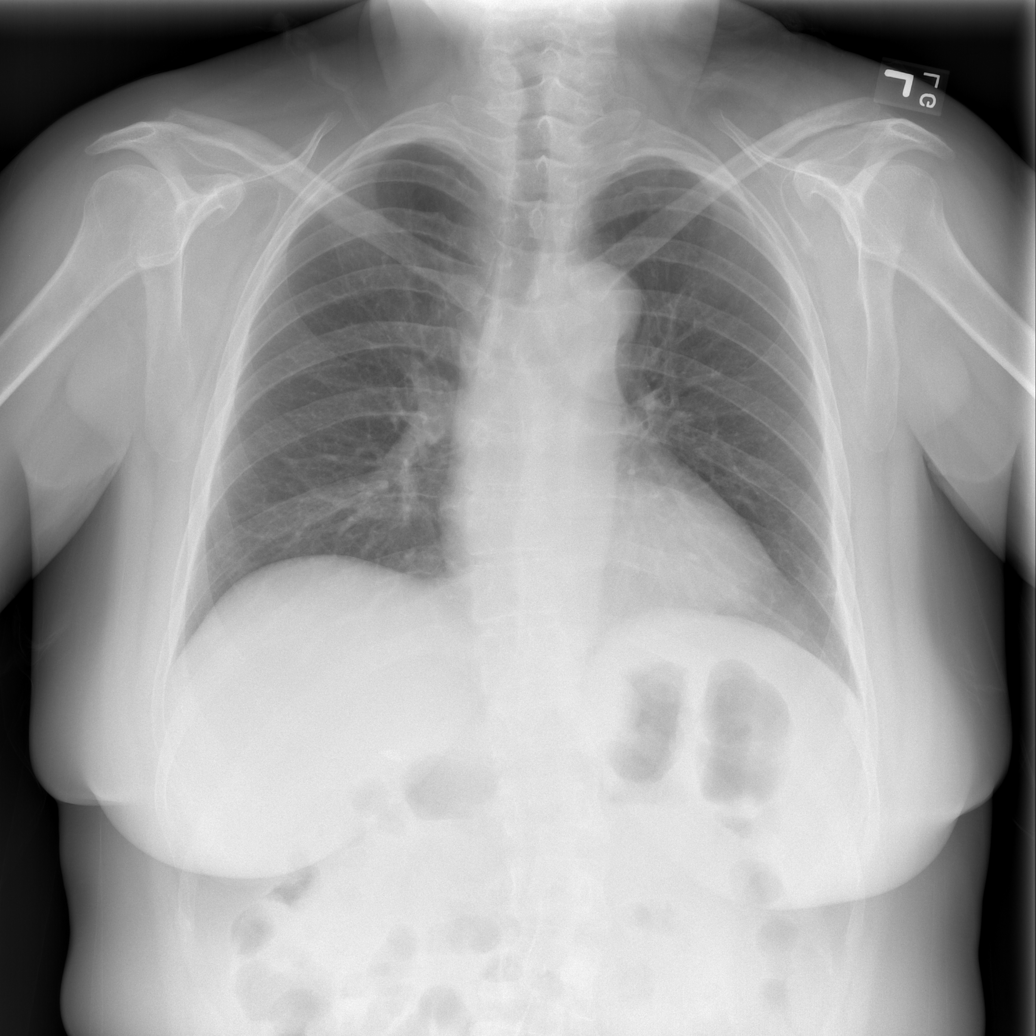

[w abdomen upright *]
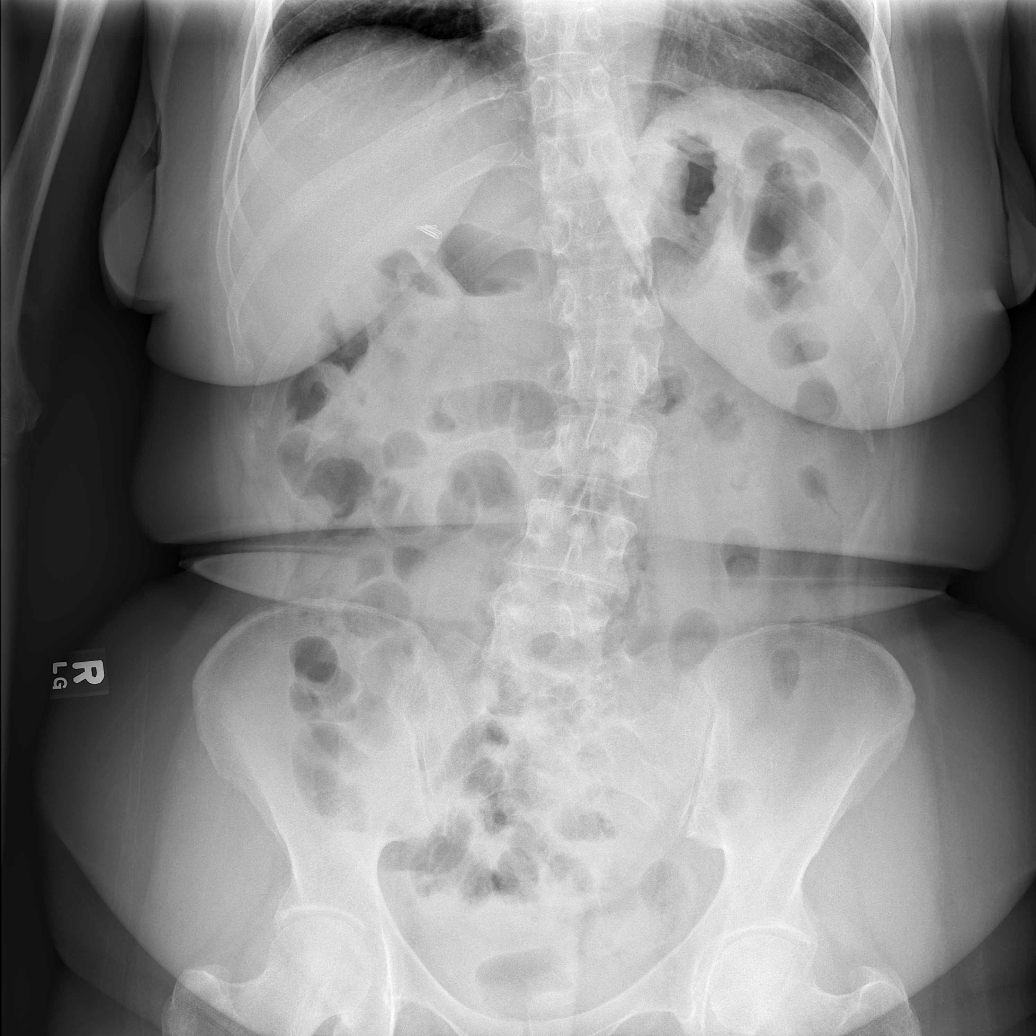

[t abdomen supine (1 of 2)]
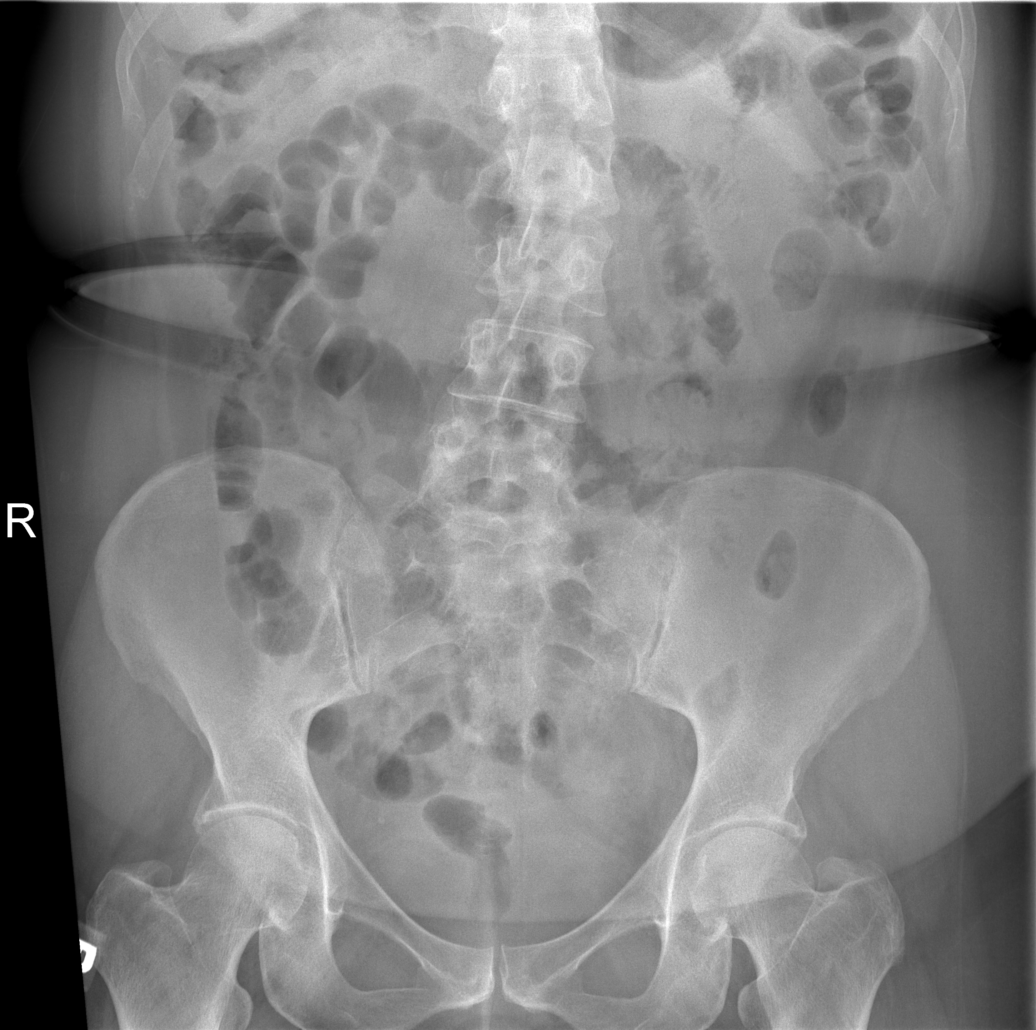

[t abdomen supine (2 of 2)]
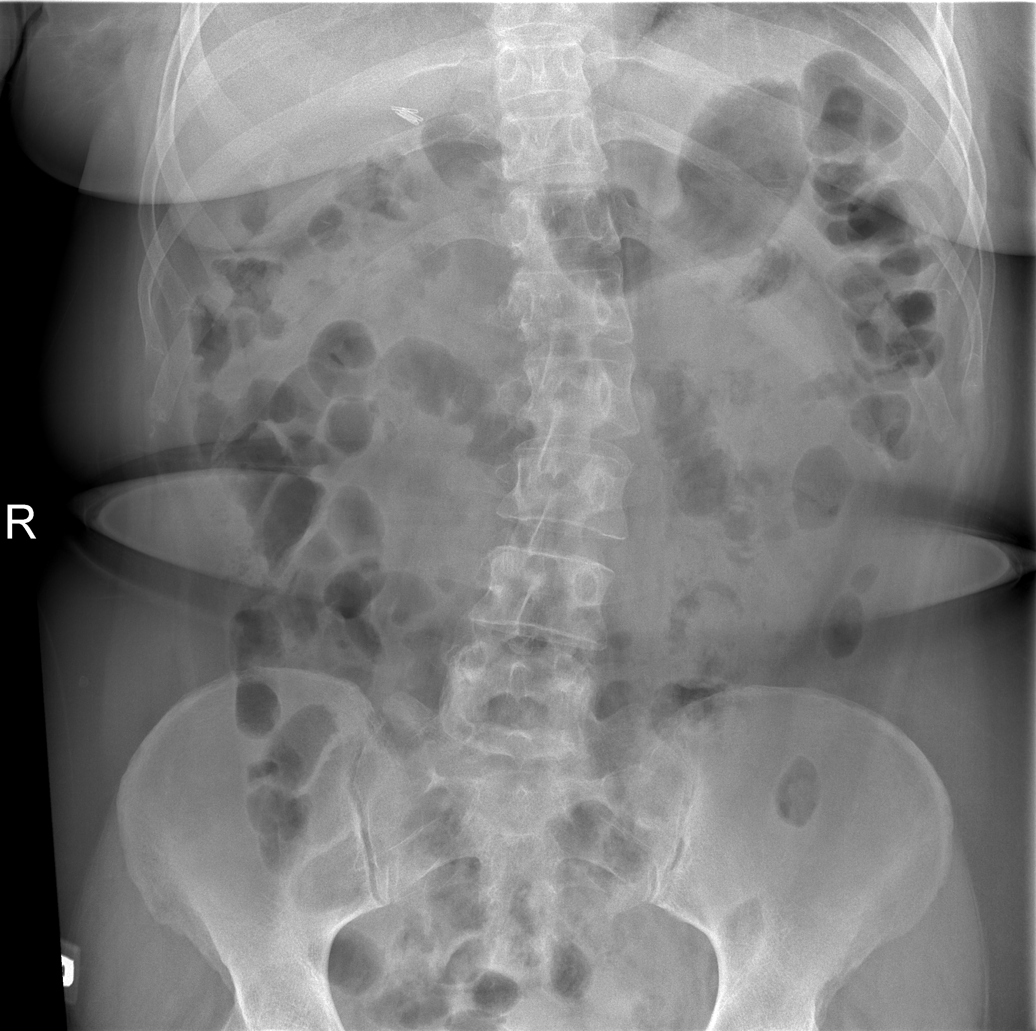

[4 of 4 positions shown; findings below may reference images not displayed]

IMPRESSION: No obstruction or free air.

No acute findings.
Addendum Ends
FINDINGS: There is a nonobstructed bowel gas pattern. No free air. Patient
status post cholecystectomy. No organomegaly or suspicious calcification. Mild
left scoliosis of the lumbar spine.

Heart is normal size. Lungs are clear.

IMPRESSION

No obstruction or free air. Thereafter no acute findings.

## 2009-07-30 IMAGING — US US ABDOMEN COMPLETE
1 series · 14 of 25 positions shown · non-contrast
Comparison: CT of the abdomen of 06/15/07.

CLINICAL DATA: 51-year-old female with acute pancreatitis, nausea and vomiting. 
ABDOMEN ULTRASOUND:
TECHNIQUE: Complete abdominal ultrasound examination was performed including evaluation of the liver, gallbladder, bile ducts, pancreas, kidneys, spleen, IVC, and abdominal aorta.

[Series 1: unknown · 0.34mm/px · 14 of 41 slices shown]
[im 1/41]
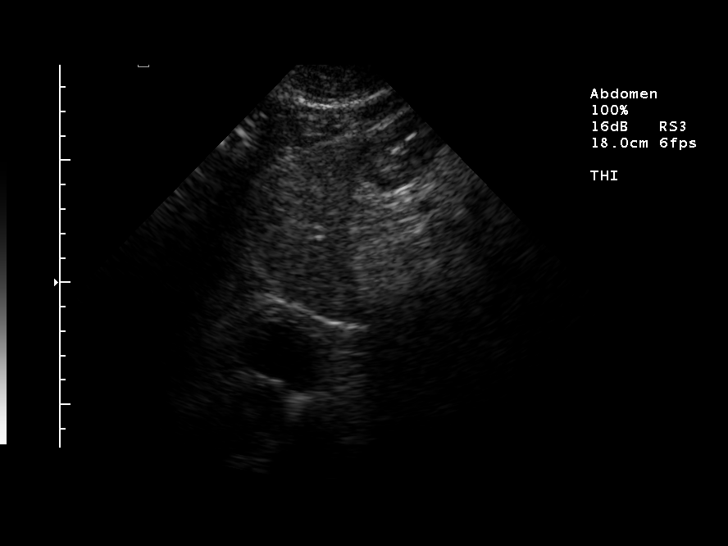
[im 4/41]
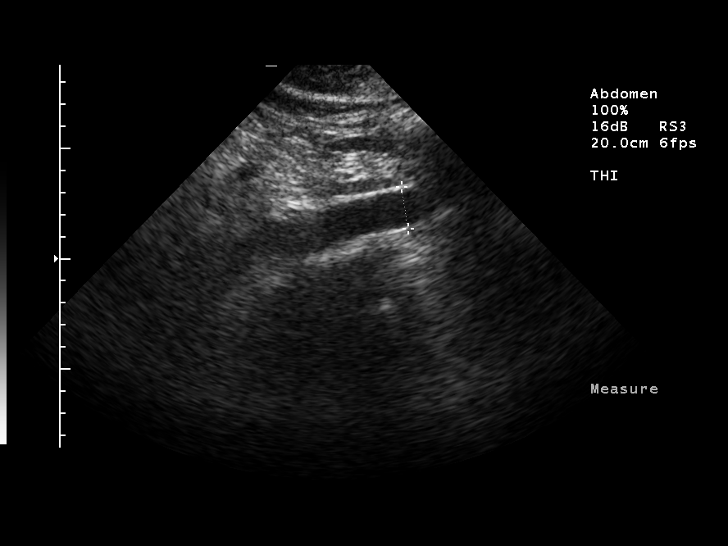
[im 7/41]
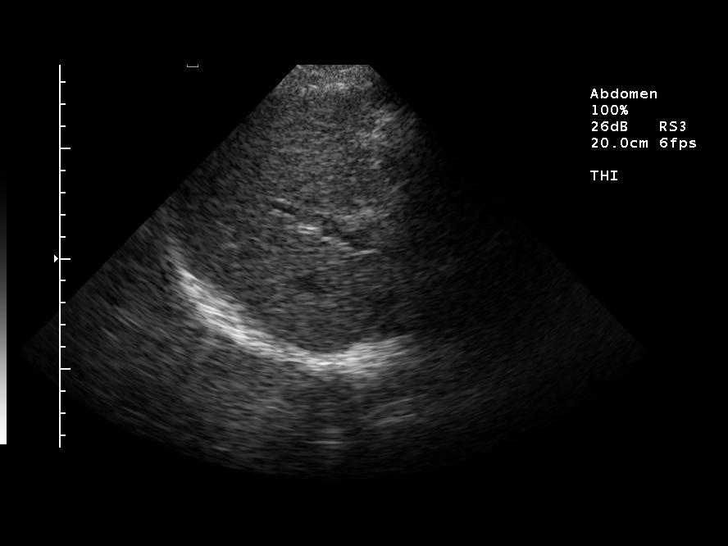
[im 11/41]
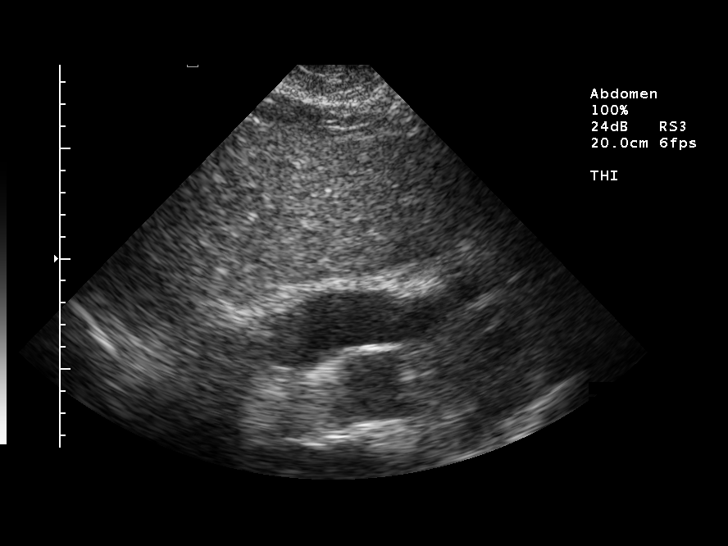
[im 14/41]
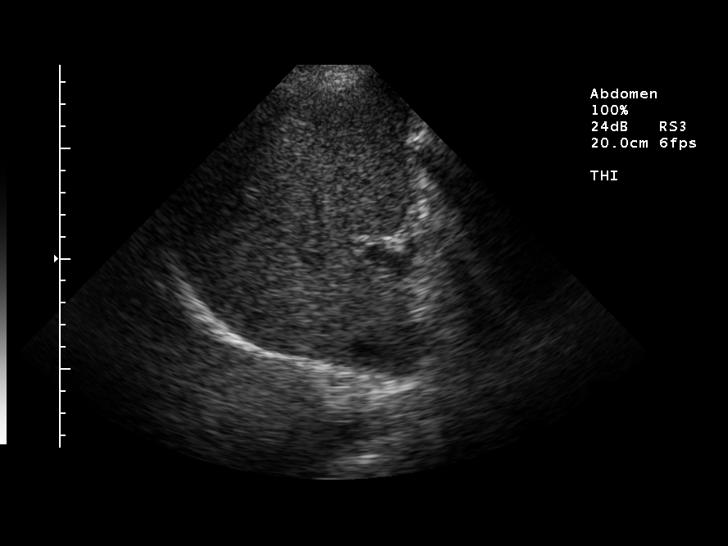
[im 16/41]
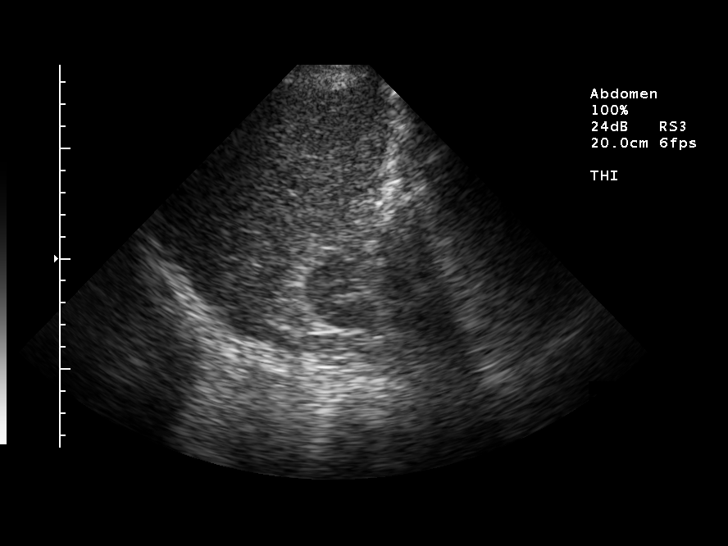
[im 19/41]
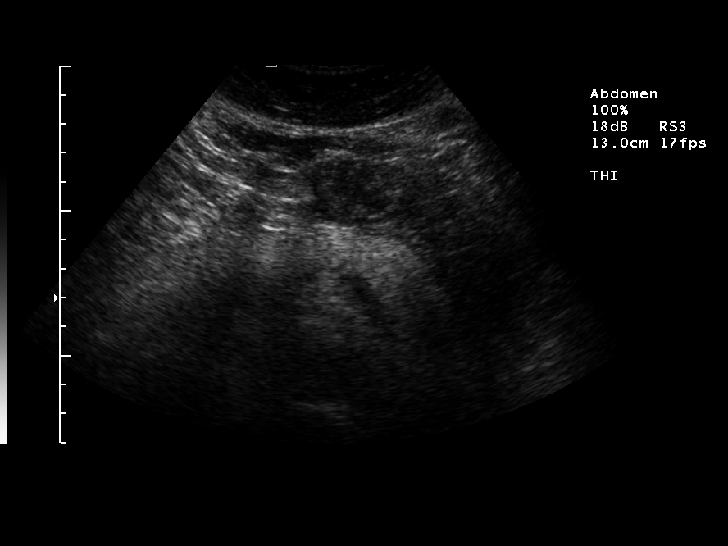
[im 22/41]
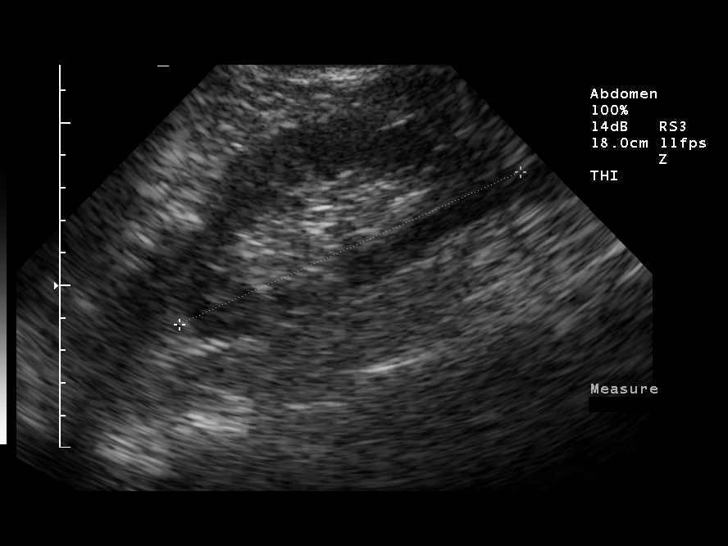
[im 26/41]
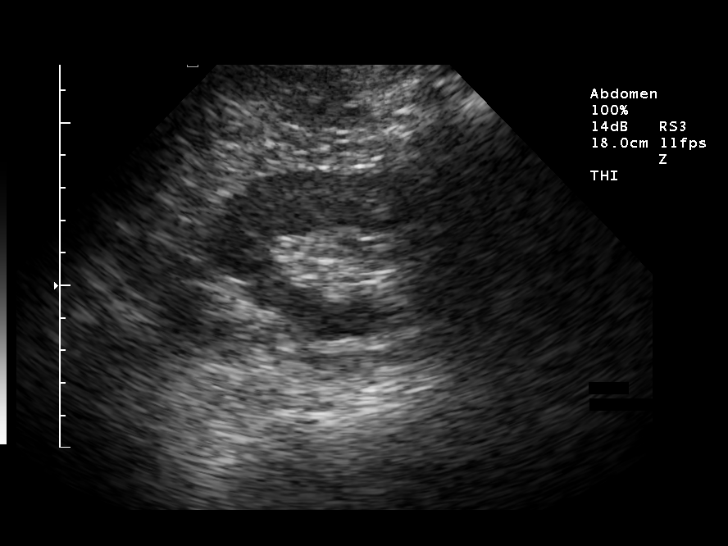
[im 27/41]
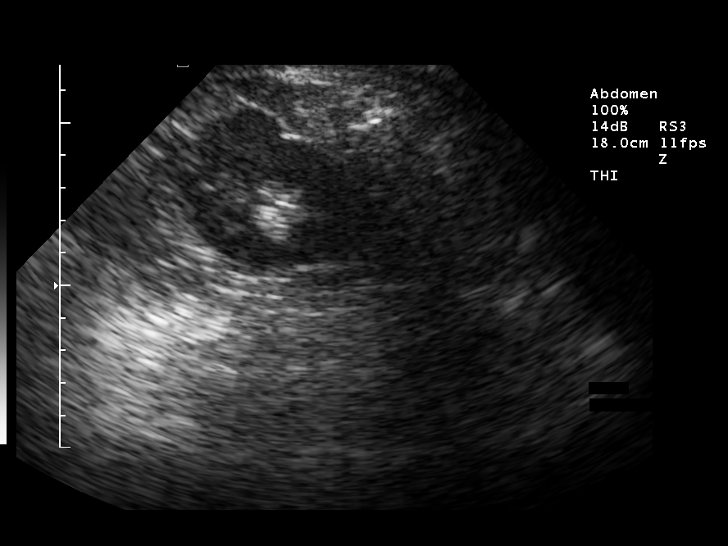
[im 31/41]
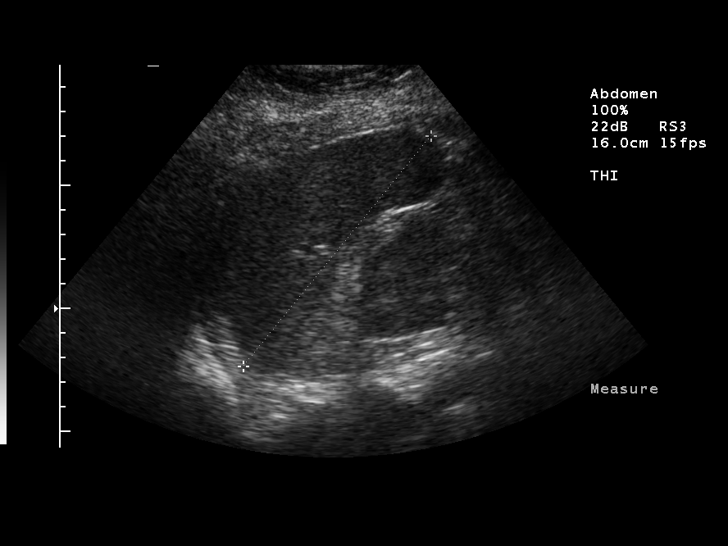
[im 34/41]
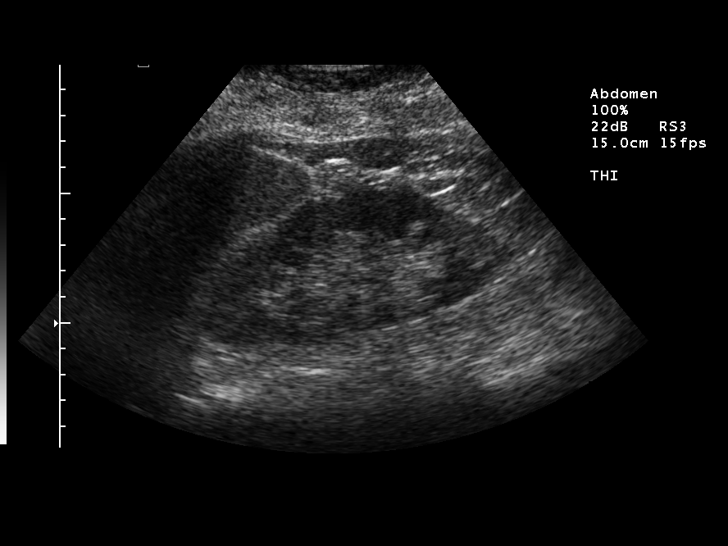
[im 37/41]
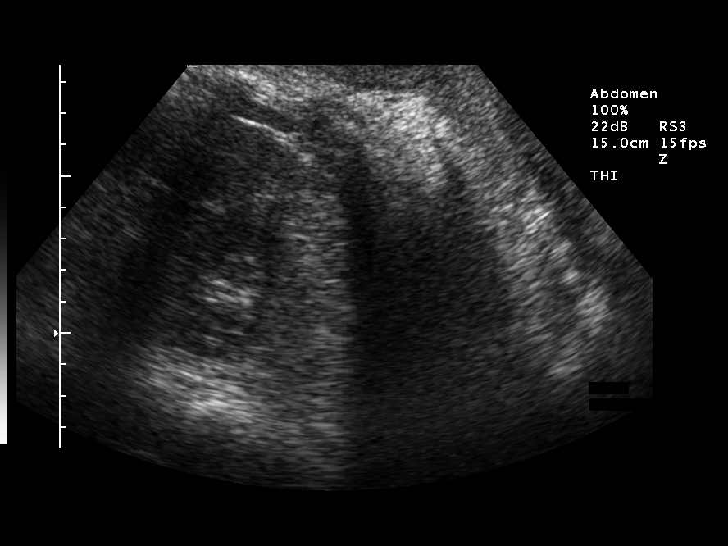
[im 41/41]
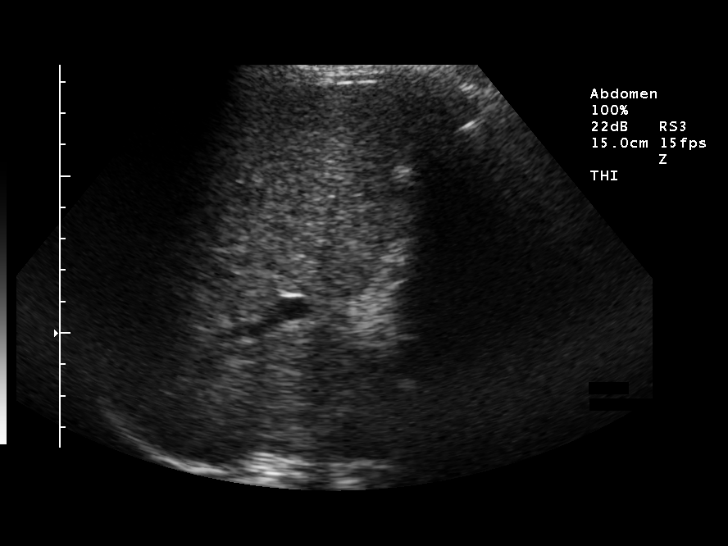

[14 of 25 positions shown; findings below may reference images not displayed]

FINDINGS: The patient is status post cholecystectomy.  Common bile duct is normal measuring 5.7 mm.  Liver demonstrates increased coarse echogenicity which can be seen with fatty infiltration.  IVC and spleen are within normal limits.  The spleen measures 12 cm in length.  The pancreas is not visualized well because of obscuring gas.  No peripancreatic fluid collection appreciated.  The right kidney measures 11.5 cm and left kidney 12.2 cm.  No hydronephrosis or perinephric fluid collection.  No shadowing calculi are identified.  Aorta has a maximal diameter of 2.9 cm.  No aneurysm or ascites.
IMPRESSION: 1. Status post cholecystectomy.  
2. Echogenic liver.
3. Limited visualization of the pancreas.
4. No ascites or fluid collection.

## 2009-09-15 IMAGING — CT CT PELVIS W/O CM
1 of 2 series · 13 of 32 positions shown, 19 images · IV contrast (agent unspecified)
Comparison: 06/15/07.

CLINICAL DATA: Nausea and vomiting and diarrhea.  Right flank pain.  Question stone. 
ABDOMEN CT WITHOUT CONTRAST:
TECHNIQUE: Multidetector CT imaging of the abdomen was performed following the standard protocol without IV contrast.
TECHNIQUE: Multidetector CT imaging of the pelvis was performed following the standard protocol without IV contrast.

[Series 4: 130 stone 4.0 b70f st · axial · 0.74mm/px · z∈[-230,+165]mm · 13 of 93 slices shown, 19 images]
[im 7/93  soft-tissue]
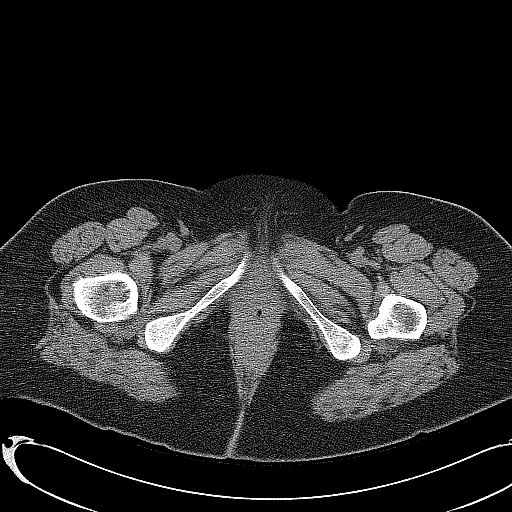
[im 7/93  bone]
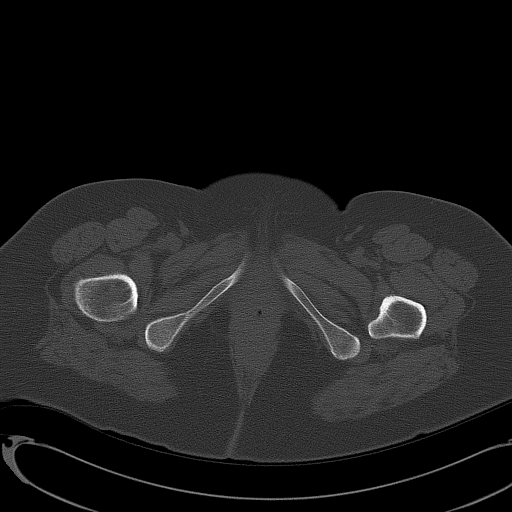
[im 13/93  soft-tissue]
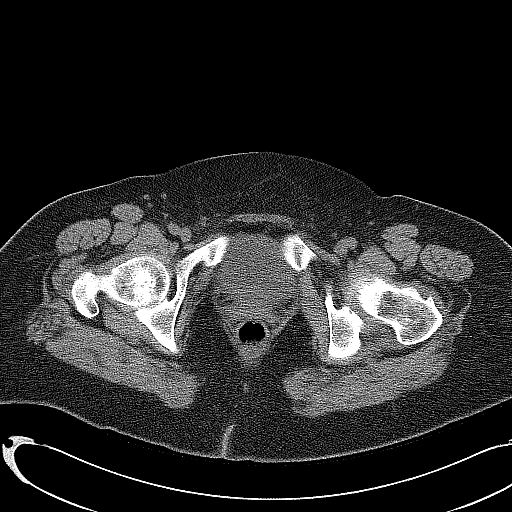
[im 19/93  soft-tissue]
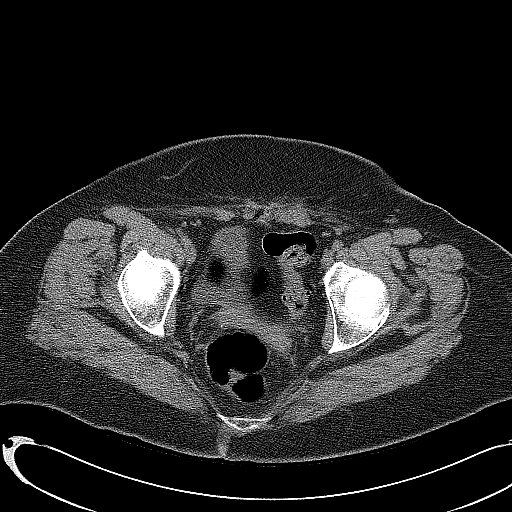
[im 25/93  soft-tissue]
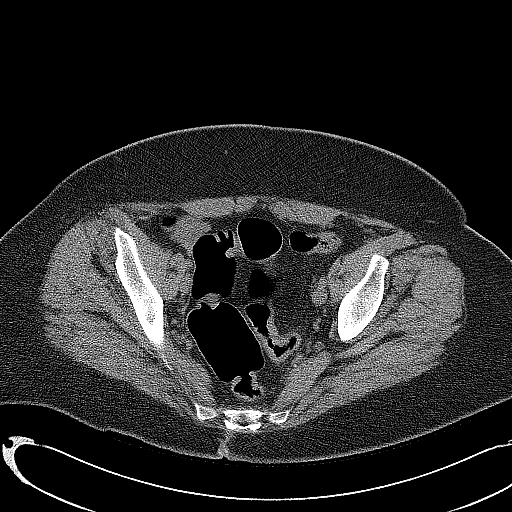
[im 31/93  soft-tissue]
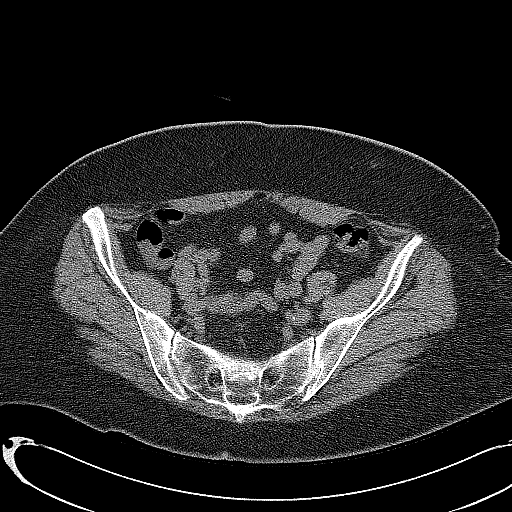
[im 37/93  soft-tissue]
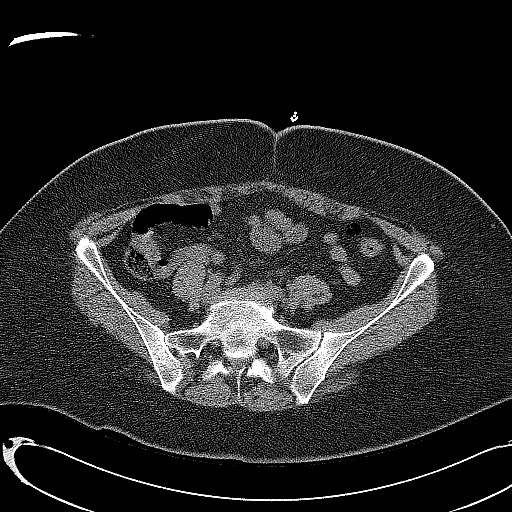
[im 50/93  soft-tissue]
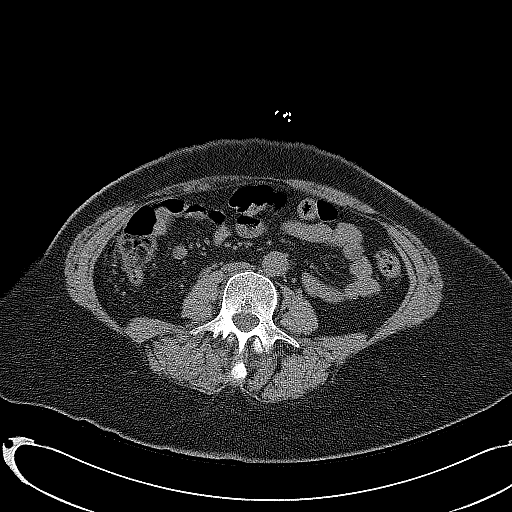
[im 56/93  soft-tissue]
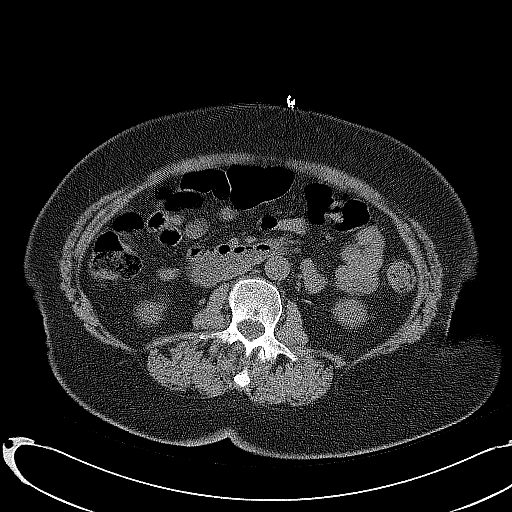
[im 62/93  soft-tissue]
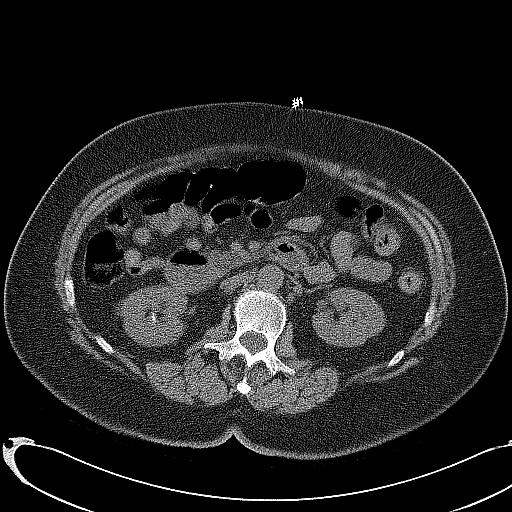
[im 62/93  bone]
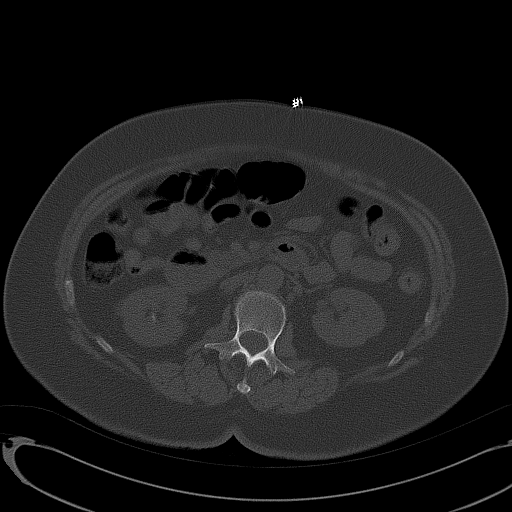
[im 68/93  soft-tissue]
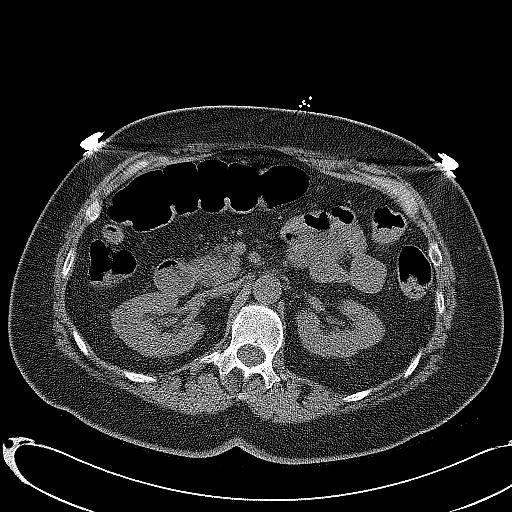
[im 68/93  lung]
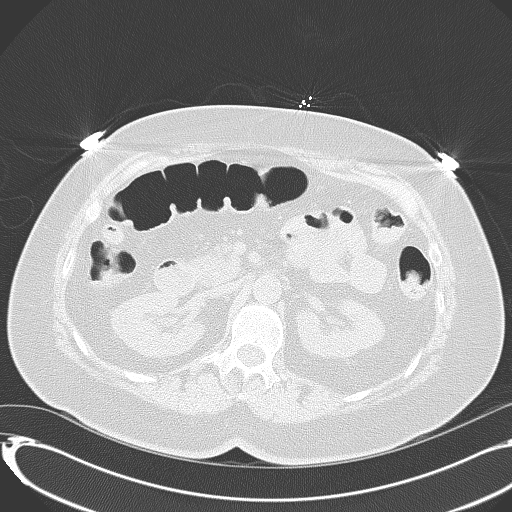
[im 74/93  soft-tissue]
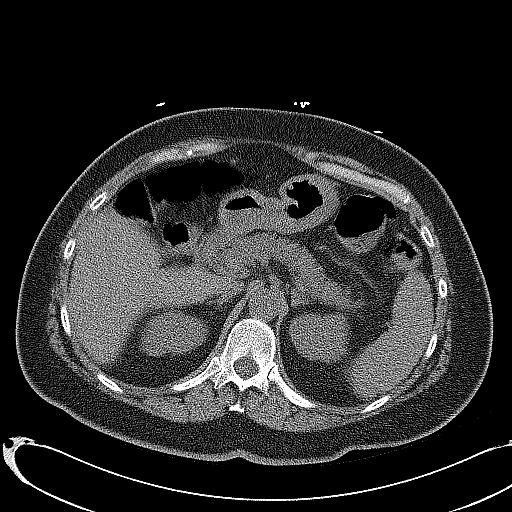
[im 74/93  lung]
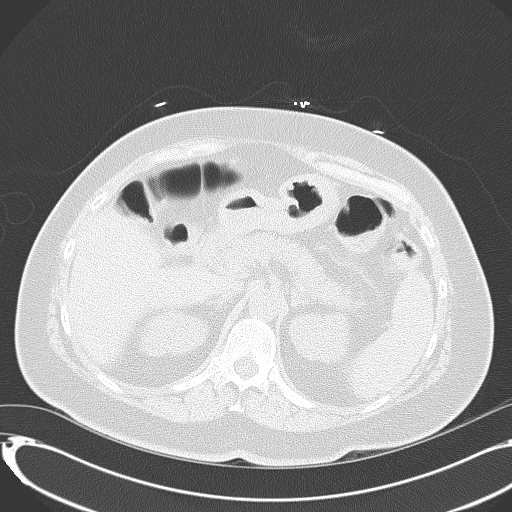
[im 80/93  soft-tissue]
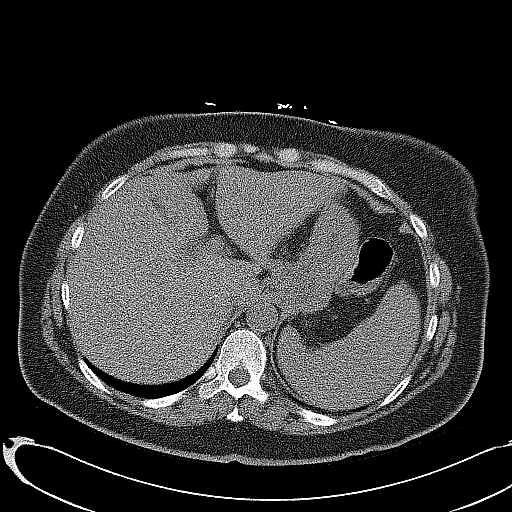
[im 80/93  lung]
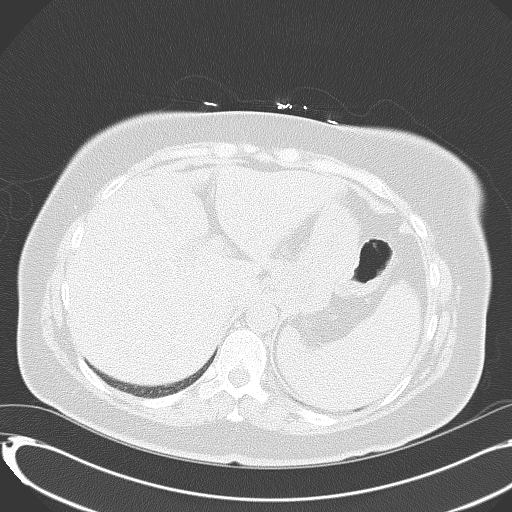
[im 86/93  soft-tissue]
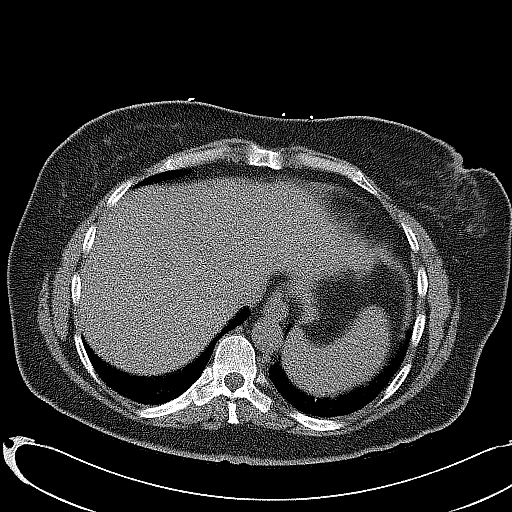
[im 86/93  lung]
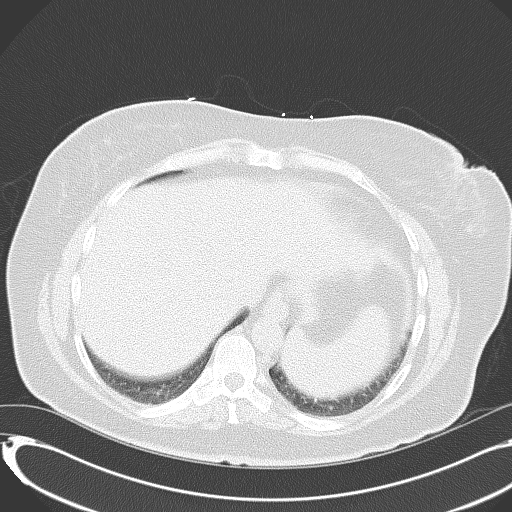

[13 of 32 positions shown; findings below may reference images not displayed]

FINDINGS: Lung bases clear.  Nonobstructing lower pole right renal calculus.  Left proximal ureter 3.3 mm nonobstructing stone.   Status-post cholecystectomy.  Unenhanced visualized portions of the liver are unremarkable.   Unenhanced imaging of the spleen, pancreas and adrenal glands are unremarkable.  No upper abdominal abnormal fluid collection or inflammatory process.  Evaluation of bowel is limited secondary to lack of contrast and underdistention.
IMPRESSION: 1.  Nonobstructing lower pole right renal calculus. 
2.  3.3 mm proximal left ureteral calculus without fullness of the proximal collecting system.  The patient has had multiple CTs, as previously noted and further imaging will need to be considered carefully prior to ordering such.  The present left proximal ureteral stone is seen on the scout view and may be able to be followed with combination of plain films and ultrasound rather than CT.  Results called to Dr. Georgiana.
3.  No upper abdominal abnormal fluid collection or inflammatory process. 
PELVIS CT WITHOUT CONTRAST:
FINDINGS: No ureteral calculus.  No pelvic inflammatory  process.
IMPRESSION: 1.  No distal ureteral stone.  
2.  No pelvic inflammatory process.     
3.  Scoliosis.

## 2009-09-15 IMAGING — CR DG ABDOMEN ACUTE W/ 1V CHEST
4 series · 4 of 4 positions shown · non-contrast
Comparison: none

CLINICAL DATA: 51-year-old female with nausea, vomiting, and diarrhea. 
 ACUTE ABDOMINAL SERIES - 3 VIEW:

[w chest pa]
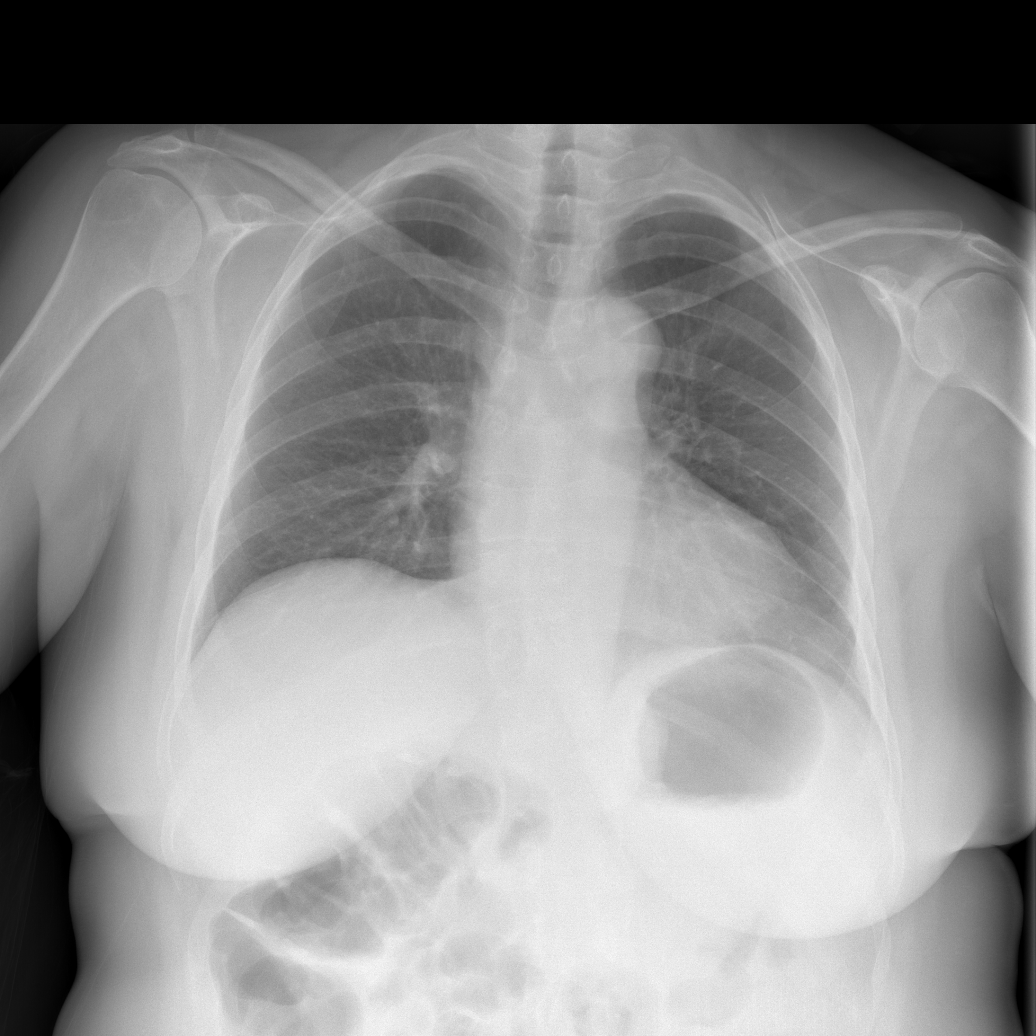

[w abdomen upright]
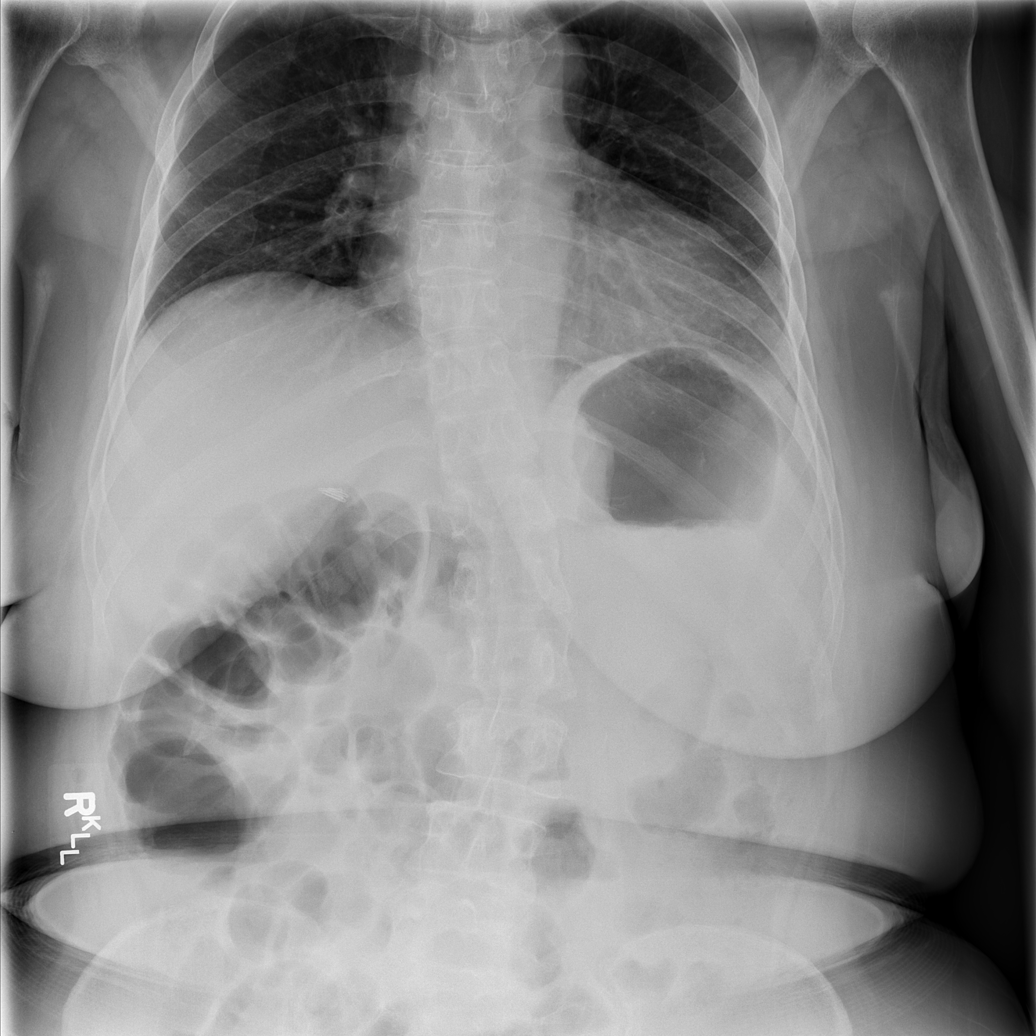

[t abdomen supine (1 of 2)]
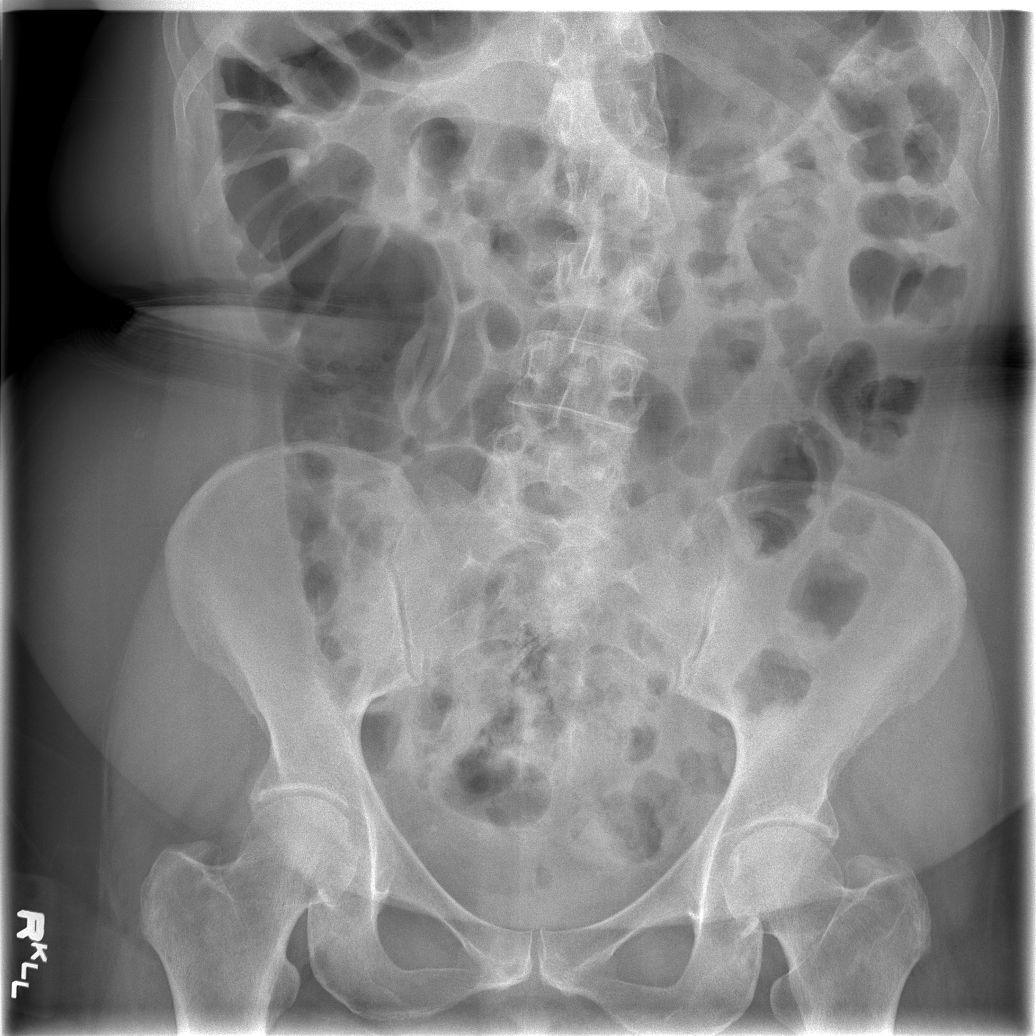

[t abdomen supine (2 of 2)]
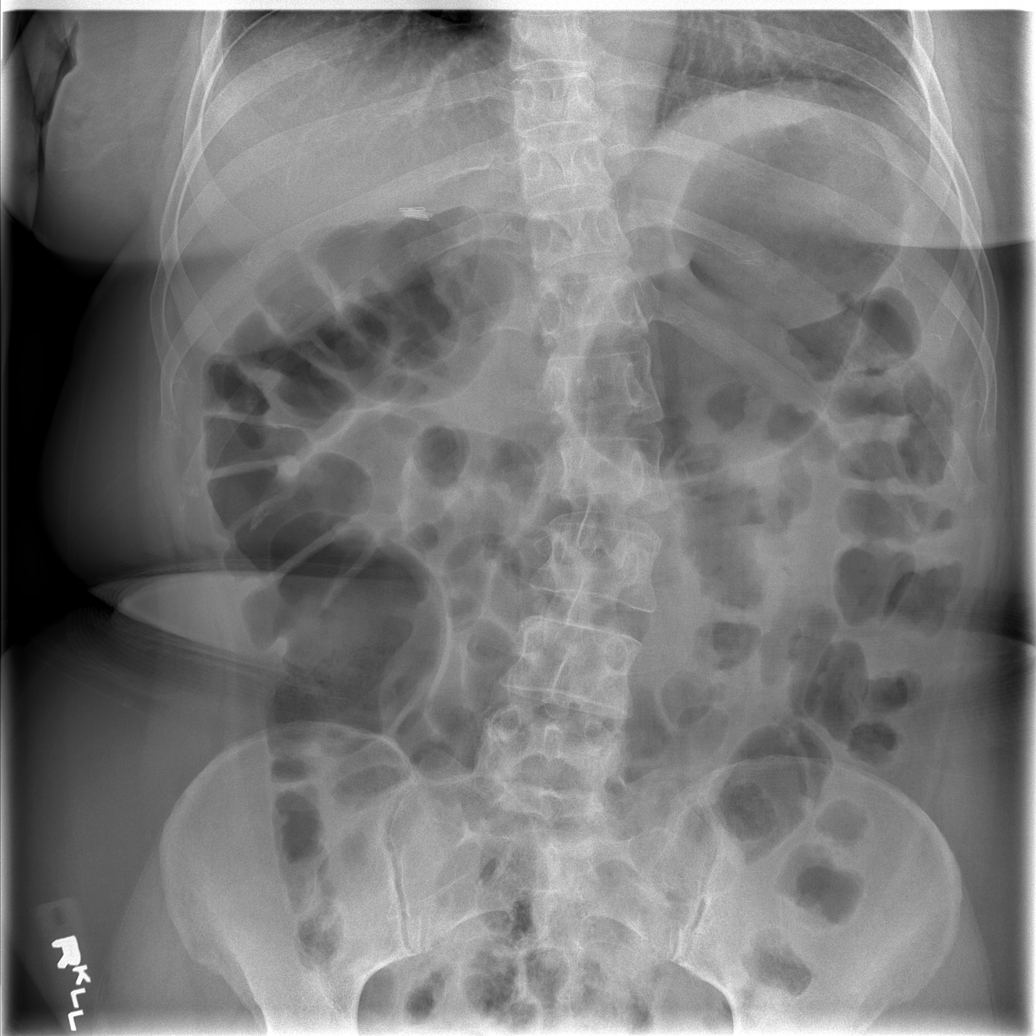

[4 of 4 positions shown; findings below may reference images not displayed]

FINDINGS: The cardiopericardial silhouette is within normal limits for size. The lungs are clear. Lung volumes are low. 
 Supine and upright views of the abdomen demonstrate mild gaseous distention of the colon without obstruction or free air. There is mild leftward curvature of the lumbar spine. Surgical clips are noted in the gallbladder fossa.
IMPRESSION: 1.  Nonspecific bowel gas pattern without evidence for obstruction or free air.
 2.  Low lung volumes without focal air space disease.

## 2009-09-17 IMAGING — CR DG ABDOMEN 1V
2 series · 2 of 2 positions shown · non-contrast
Comparison: none

CLINICAL DATA: Abdominal pain.  Nausea, vomiting.
 ABDOMEN ? 1 VIEW:

[t abdomen supine (1 of 2)]
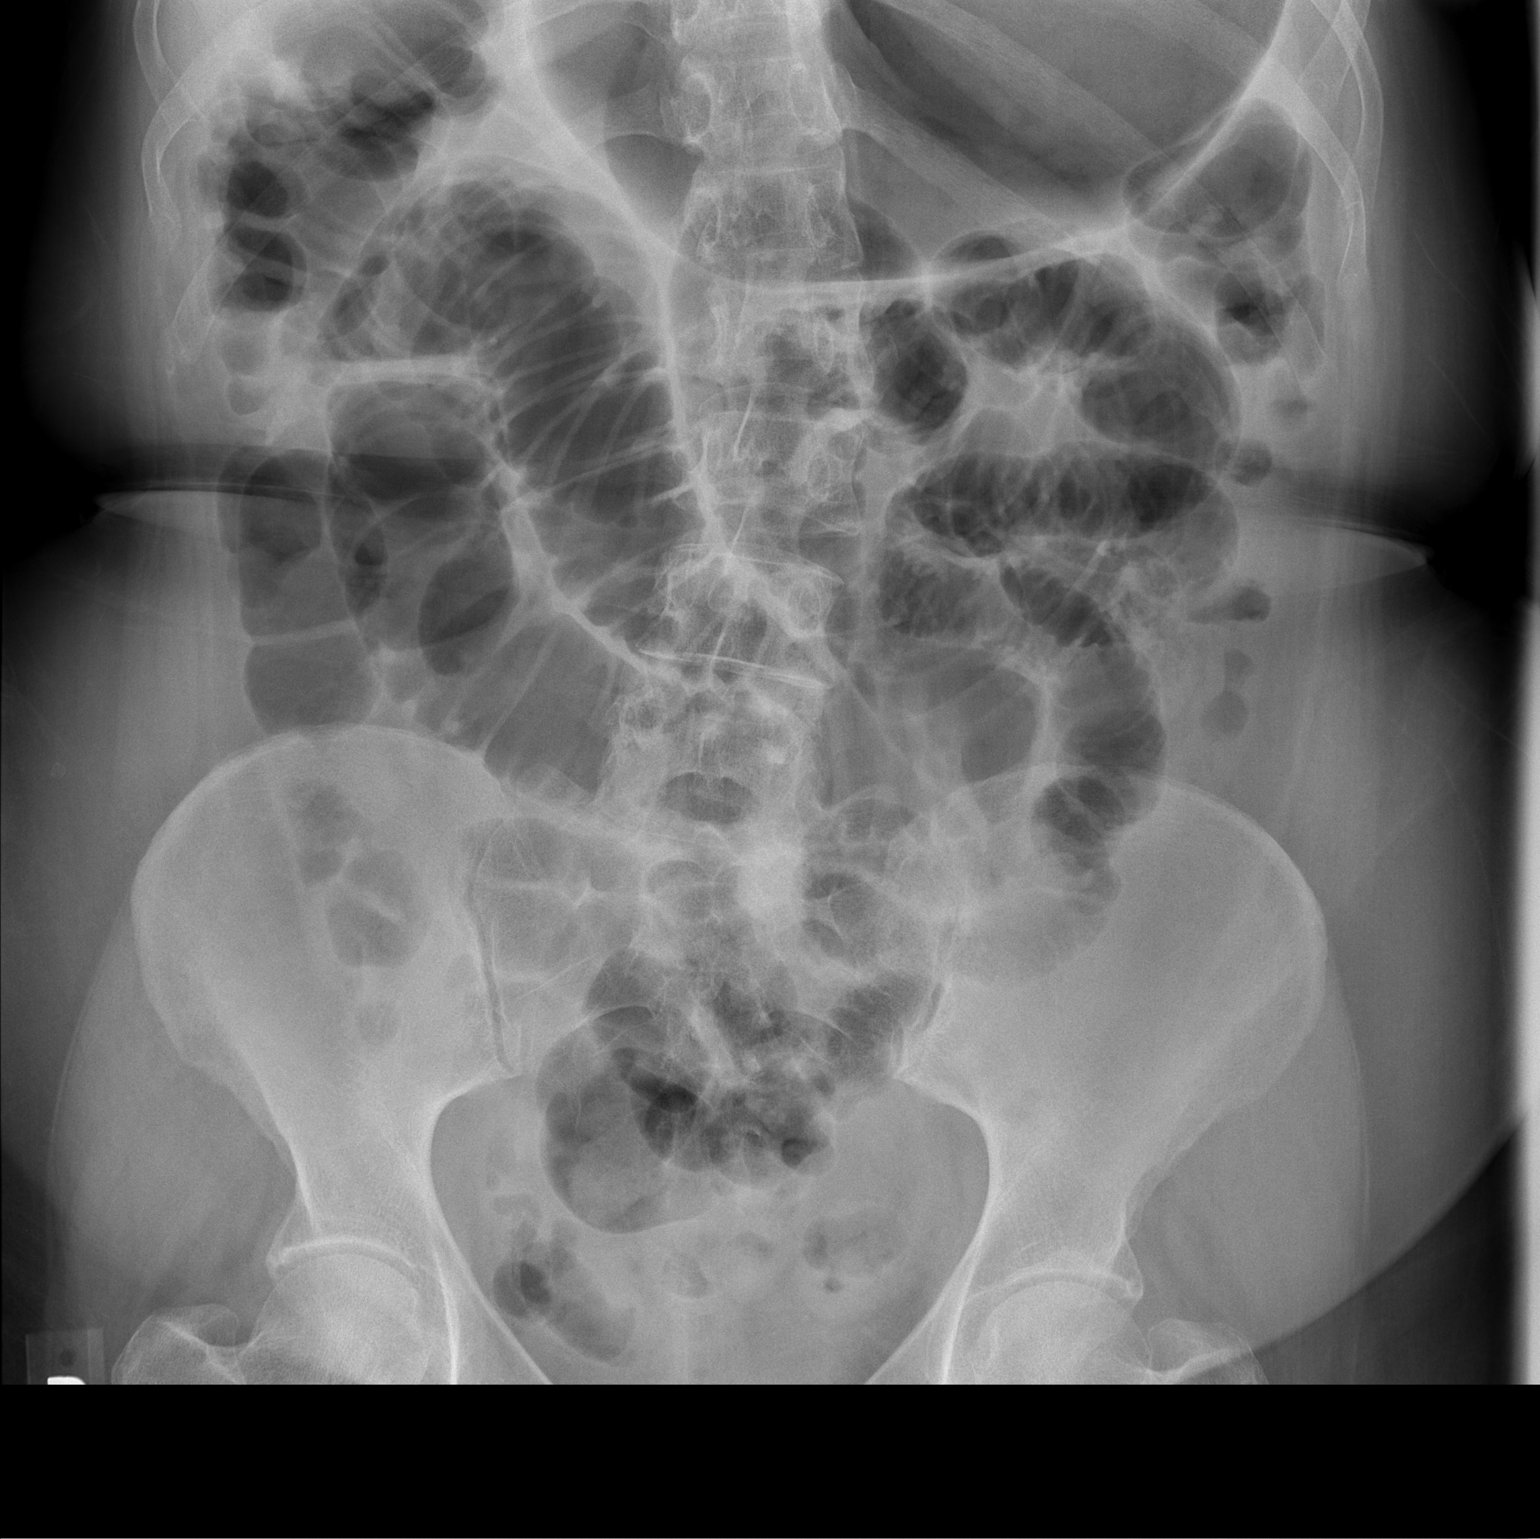

[t abdomen supine (2 of 2)]
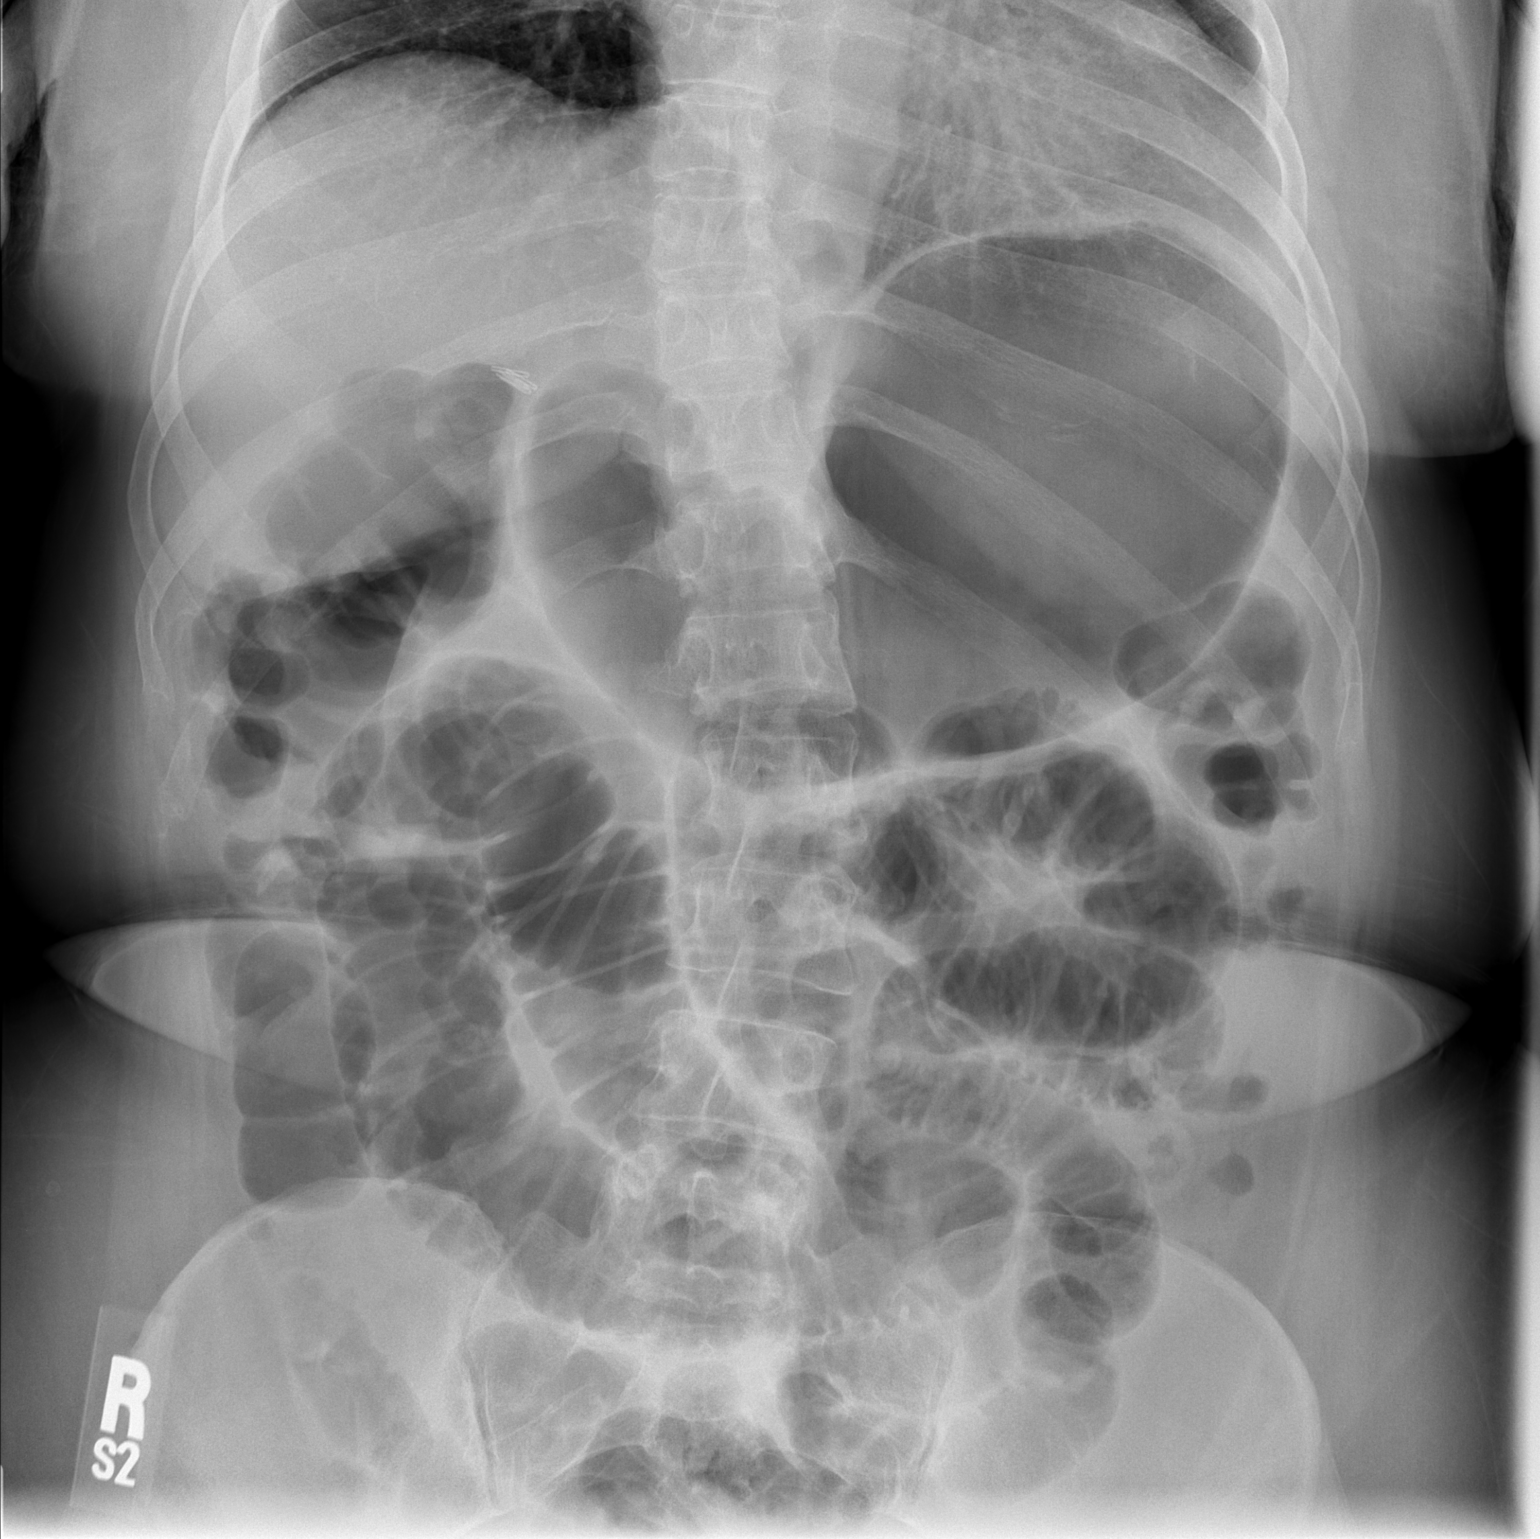

[2 of 2 positions shown; findings below may reference images not displayed]

FINDINGS: Supine and upright views show clips in the right upper quadrant post-cholecystectomy.  There is a large amount of gas in the stomach.  There is gas throughout the small bowel with moderate dilatation.  Gas does reach the colon.  The findings are felt to be most consistent with ileus.  Partial small bowel obstruction is not dogmatically excluded.
IMPRESSION: Most consistent with ileus.  See above.

## 2009-09-18 ENCOUNTER — Inpatient Hospital Stay: Payer: Self-pay | Admitting: *Deleted

## 2009-09-18 IMAGING — CR DG CHEST 1V PORT
1 series · 1 of 1 positions shown · non-contrast
Comparison: 02/02/06.

CLINICAL DATA: Chest wall pain.  Shortness of breath.   Cough. 
 PORTABLE CHEST - 1 VIEW (4858 HOURS):

[AP]
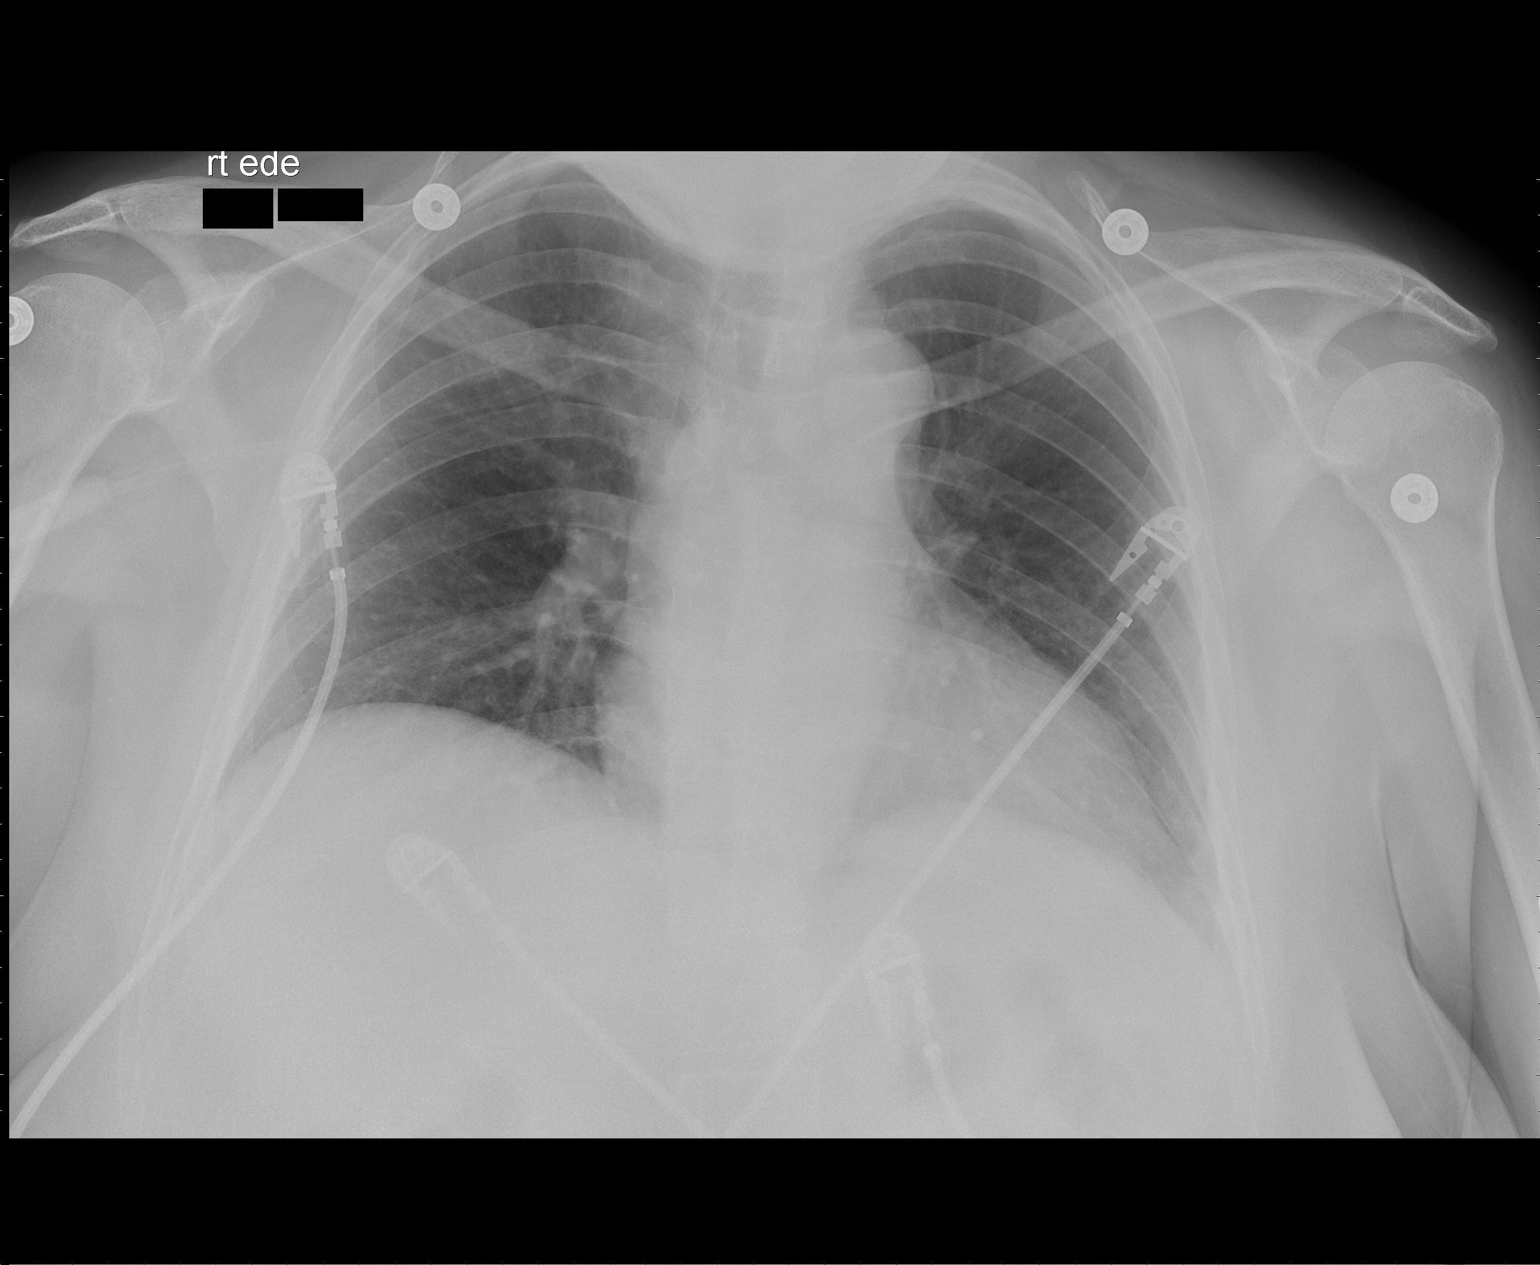

[1 of 1 positions shown; findings below may reference images not displayed]

FINDINGS: Portable view of the chest shows no infiltrate or effusion.  The heart is mildly enlarged and stable.  No bony abnormality is seen.
IMPRESSION: No active lung disease.  Stable, mild cardiomegaly.

## 2009-09-22 IMAGING — CR DG LUMBAR SPINE COMPLETE 4+V
5 series · 5 of 5 positions shown · non-contrast
Comparison: None.

CLINICAL DATA: 51 year-old fell, back pain.
 LUMBAR SPINE ? 5 VIEW:

[t l-spine a.p.]
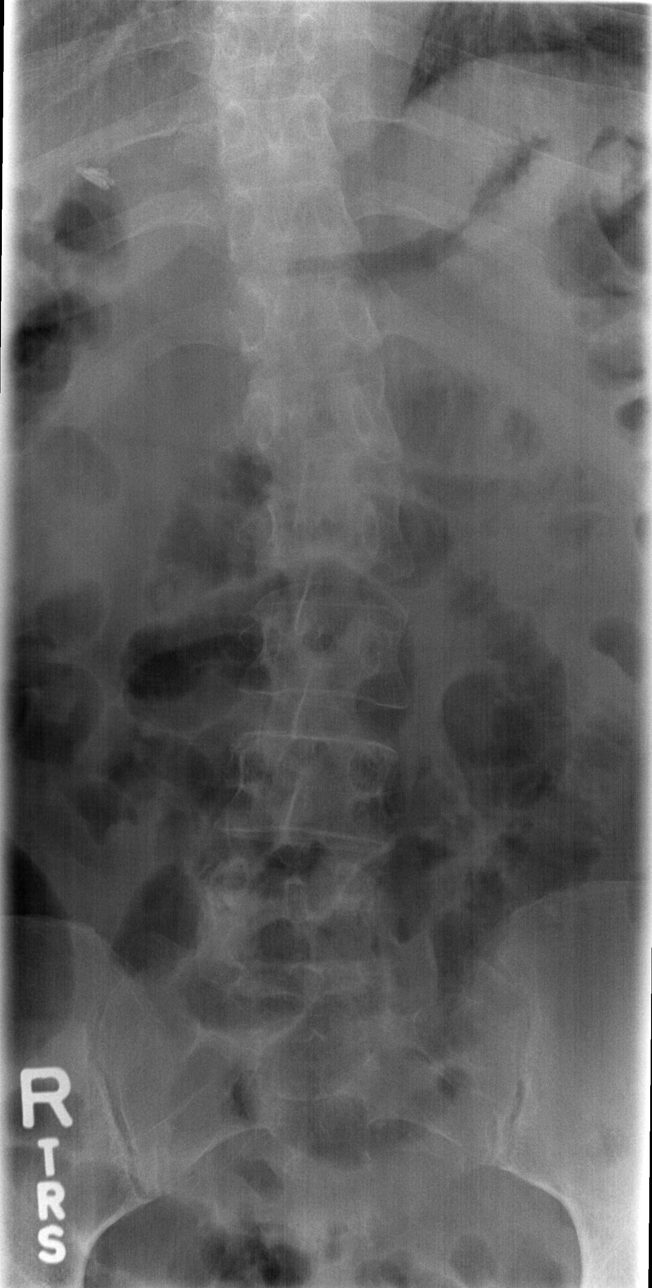

[t l-spine oblique exposure]
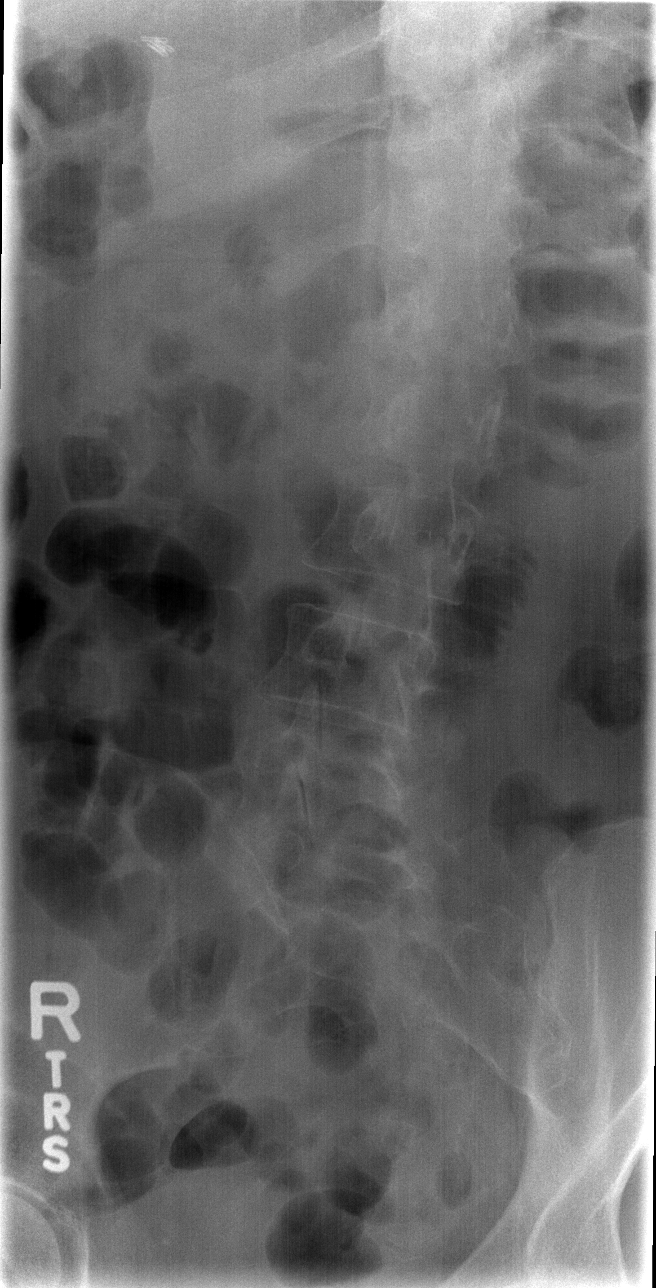

[t l-spine oblique exposure *]
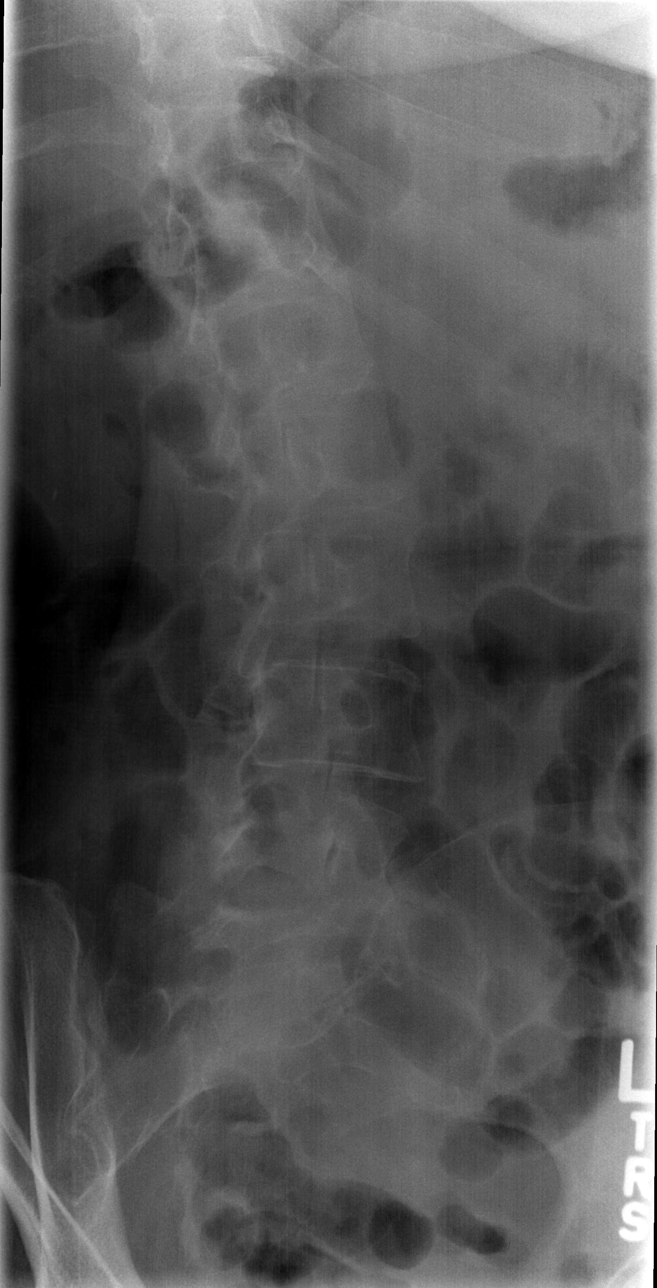

[t l-spine lat *]
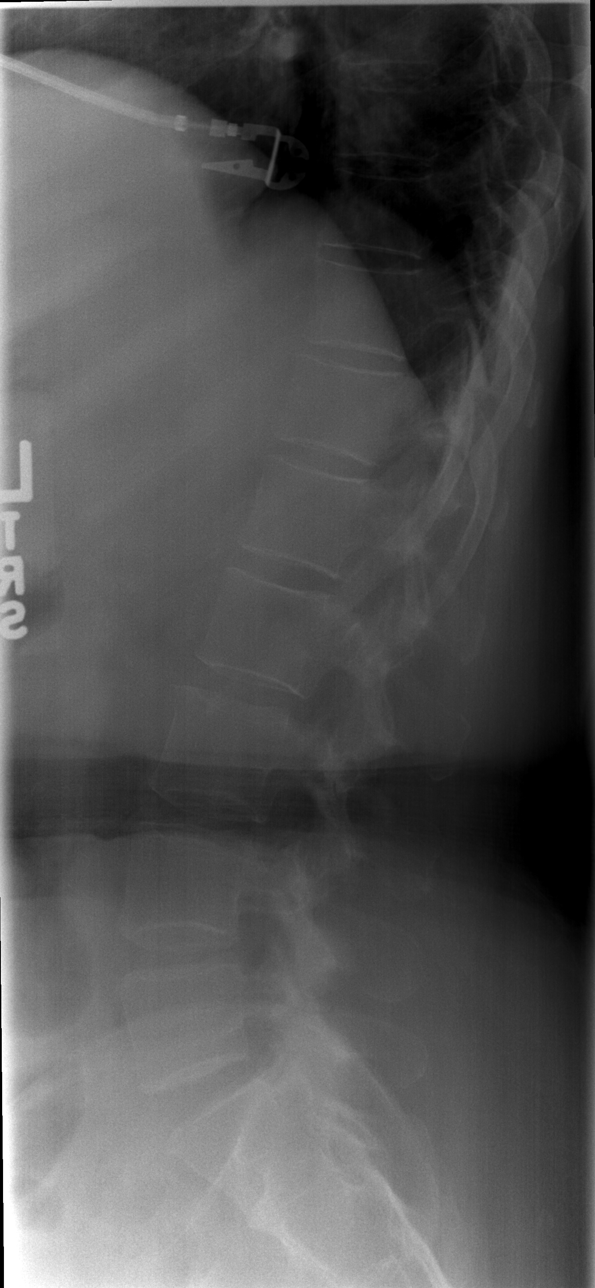

[t l-spine l5-s1 spot *]
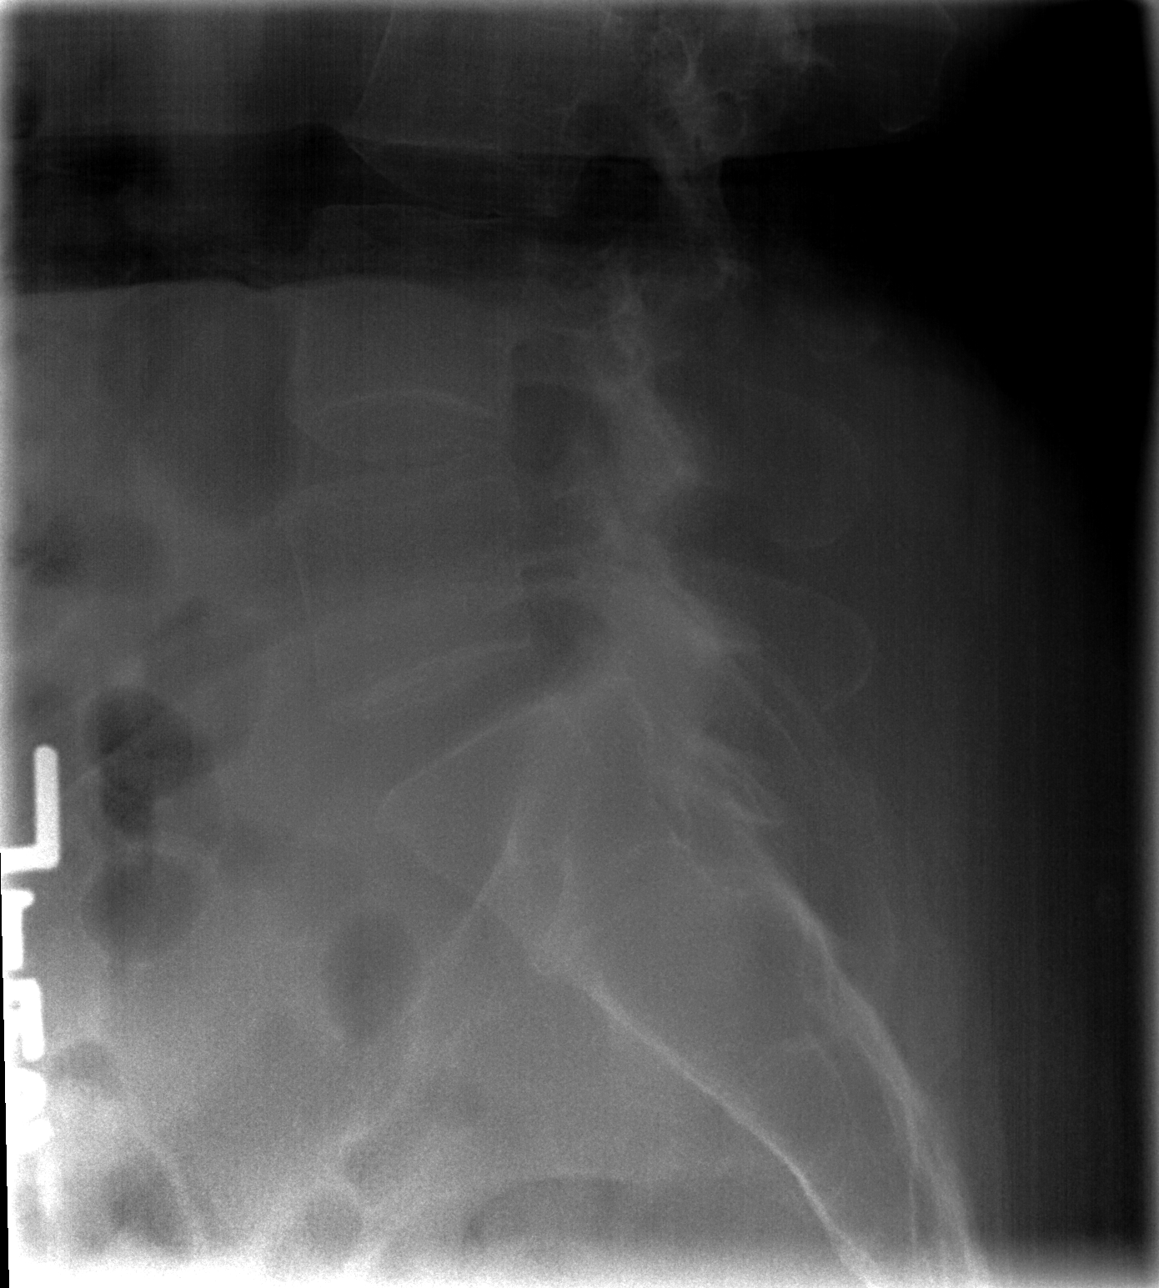

[5 of 5 positions shown; findings below may reference images not displayed]

FINDINGS: There is a left convex scoliotic curvature of the lumbar spine with a slight rotatory component. There is also mild osteoporosis.  Lateral films demonstrate normal alignment of the lumbar vertebral bodies and the disk spaces and vertebral bodies are maintained. No fractures are seen. There are facet degenerative changes at L4-5 and L5-S1 but no pars defects are seen. Visualized bony sacrum appears normal.
IMPRESSION: 1.  Mild rotatory scoliotic curvature.
 2.  No acute bony findings.
 3.  Mild osteoporosis.

## 2009-09-25 IMAGING — CT CT HEAD W/O CM
1 series · 16 of 28 positions shown, 20 images · IV contrast (agent unspecified)
Comparison: No prior studies for comparison.

CLINICAL DATA: Accidental drug overdose.  Patient is disoriented. 
 HEAD CT WITHOUT CONTRAST:
TECHNIQUE: Contiguous axial images were obtained from the base of the skull through the vertex according to standard protocol without contrast.

[Series 2: brain · axial · 0.47mm/px · z∈[+136,+268]mm · 16 of 28 slices shown, 20 images]
[im 2/28  brain]
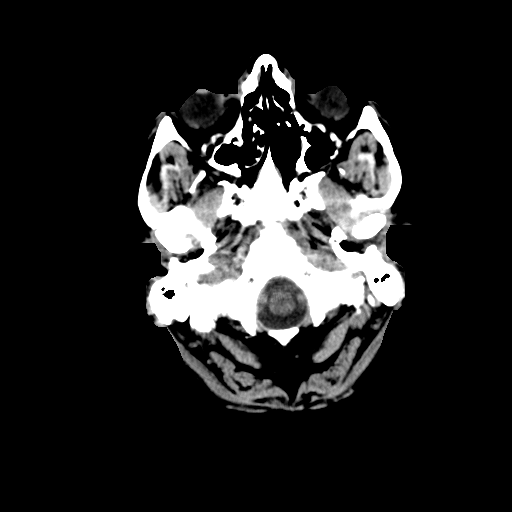
[im 2/28  bone]
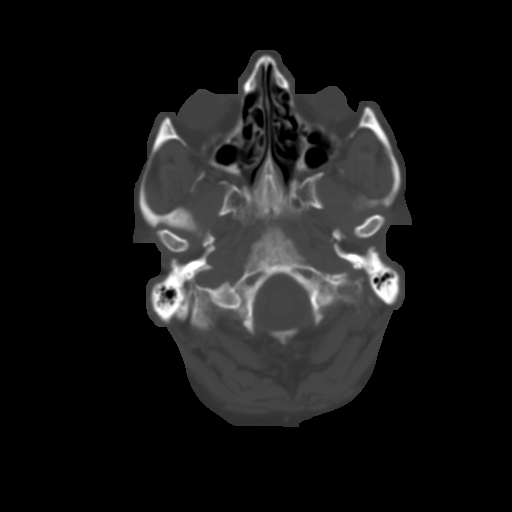
[im 4/28  brain]
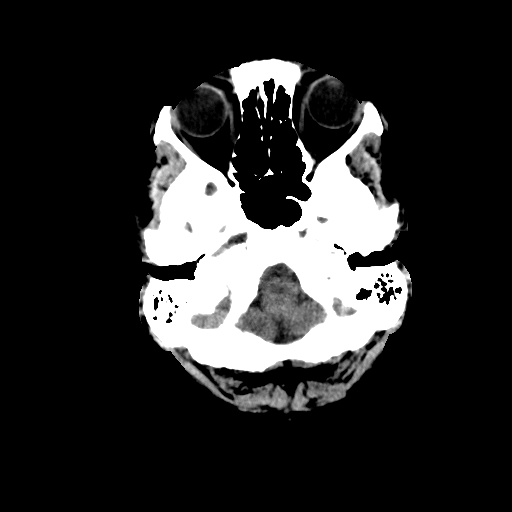
[im 6/28  brain]
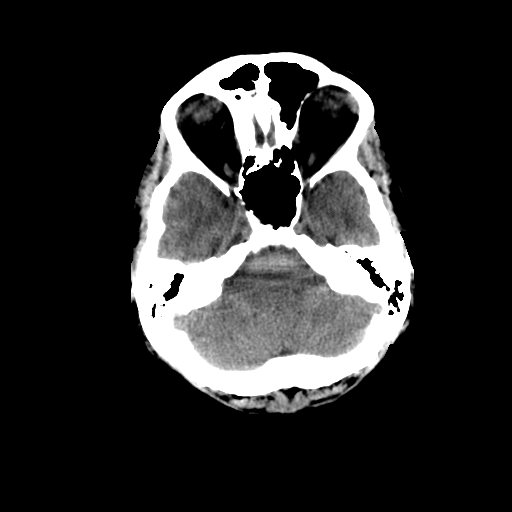
[im 7/28  brain]
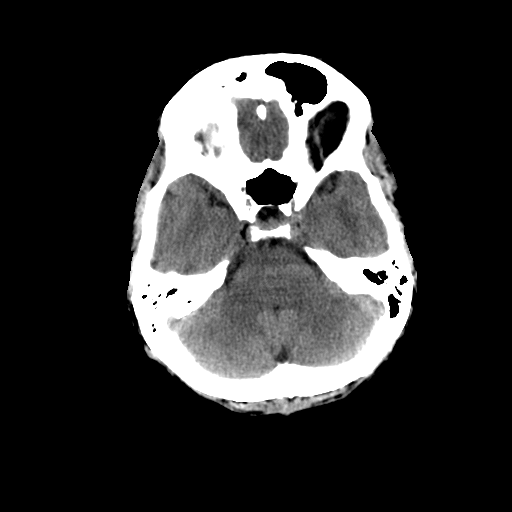
[im 9/28  brain]
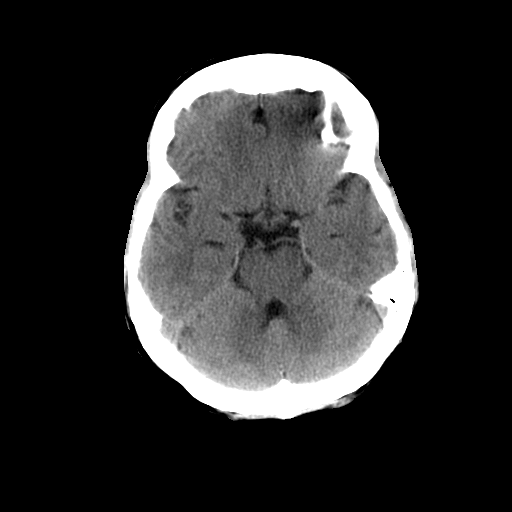
[im 9/28  bone]
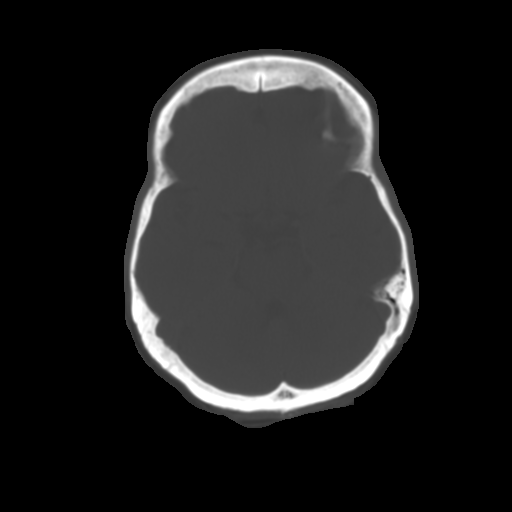
[im 10/28  brain]
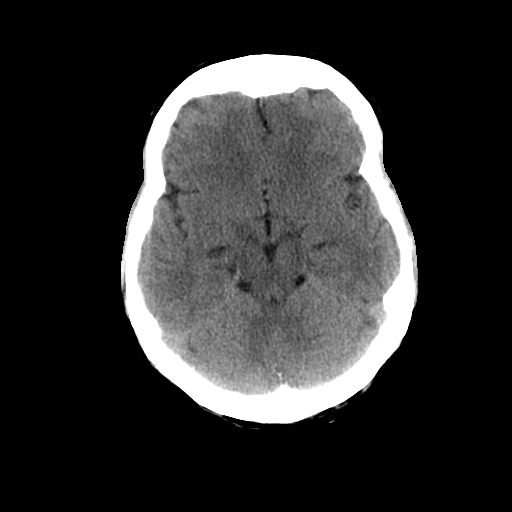
[im 12/28  brain]
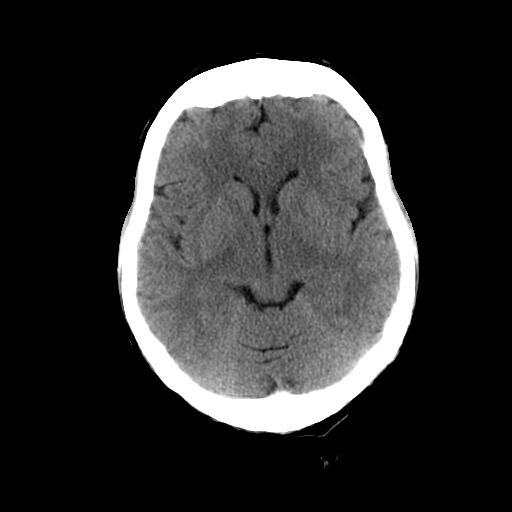
[im 14/28  brain]
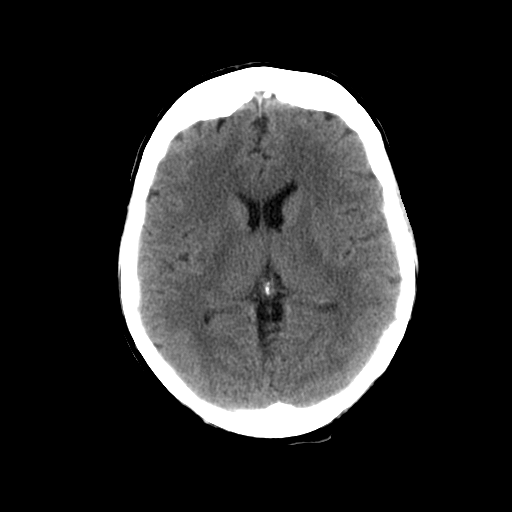
[im 15/28  brain]
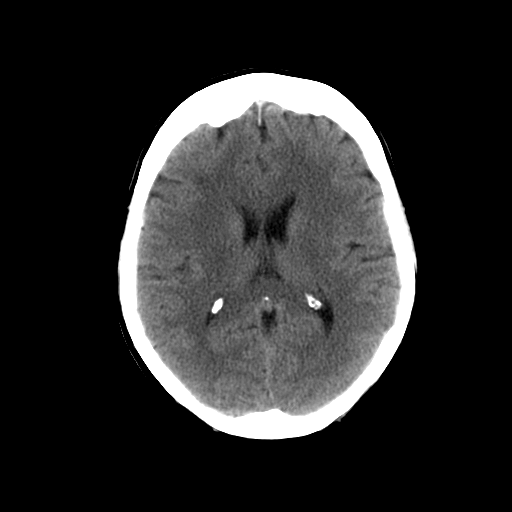
[im 15/28  bone]
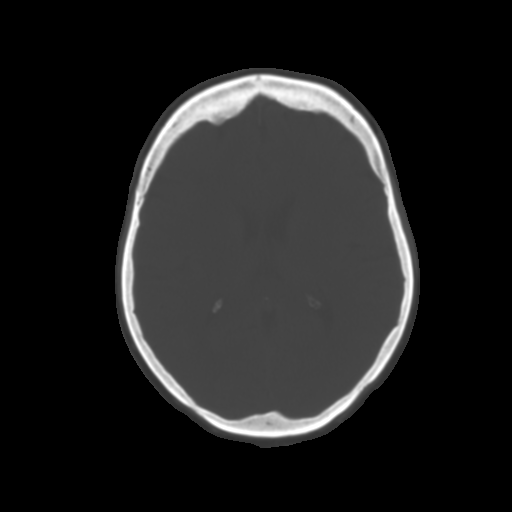
[im 17/28  brain]
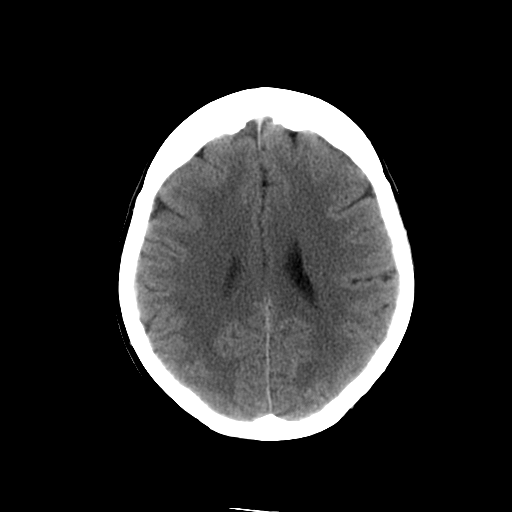
[im 19/28  brain]
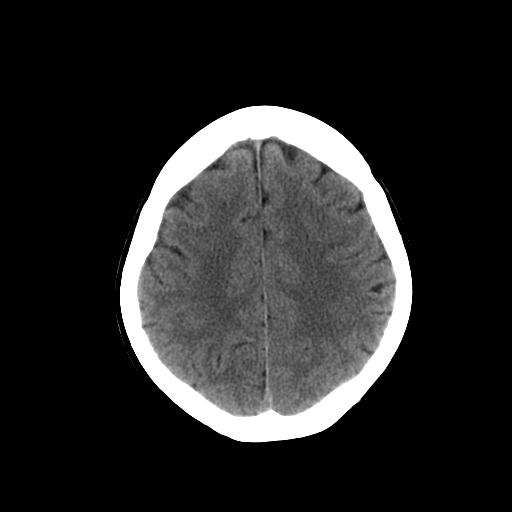
[im 20/28  brain]
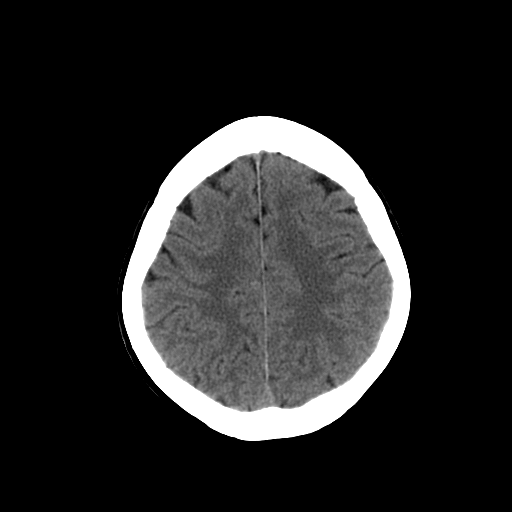
[im 22/28  brain]
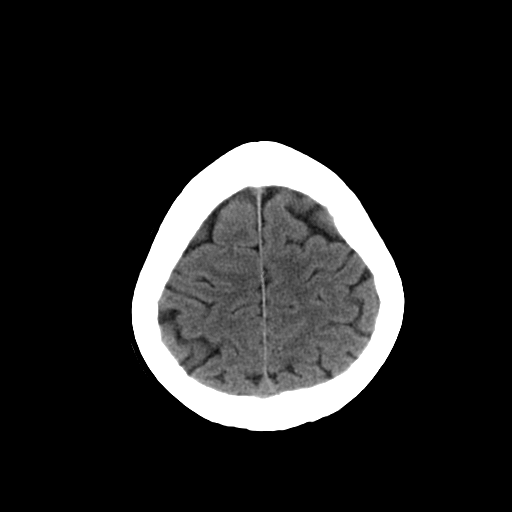
[im 22/28  bone]
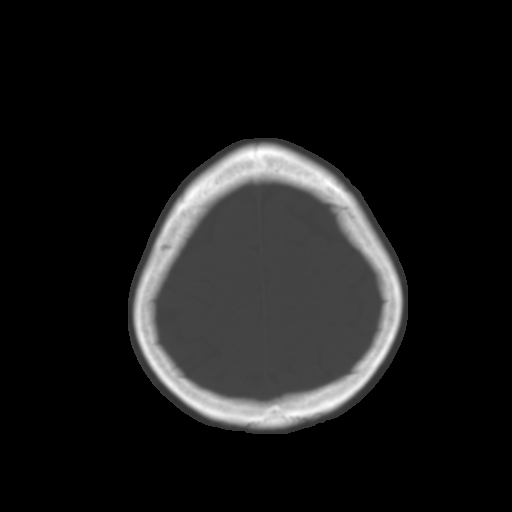
[im 23/28  brain]
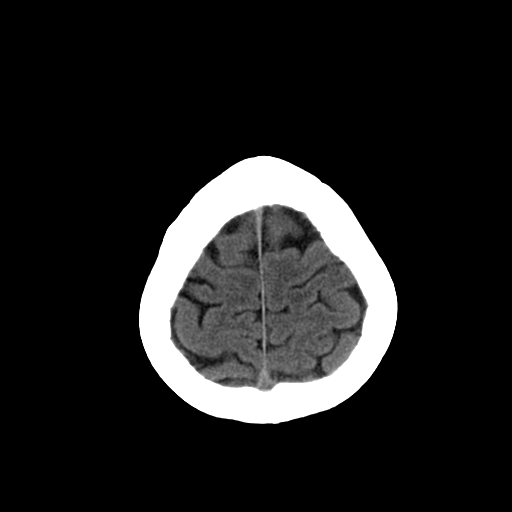
[im 25/28  brain]
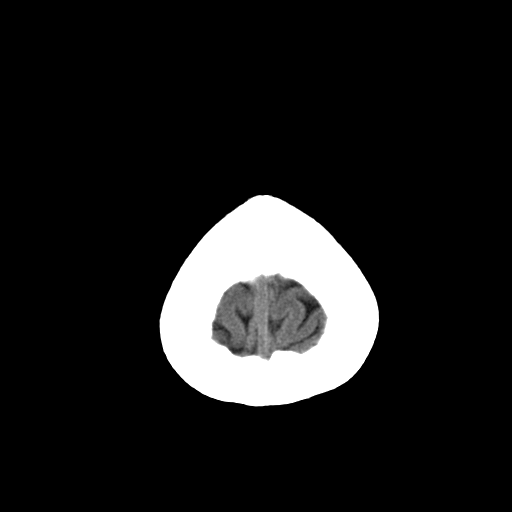
[im 27/28  brain]
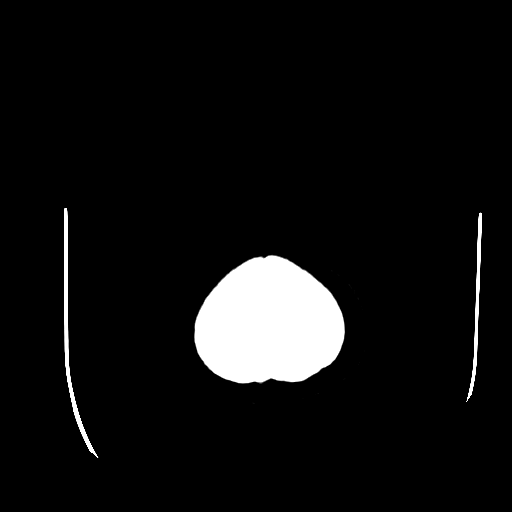

[16 of 28 positions shown; findings below may reference images not displayed]

FINDINGS: There is no evidence of intracranial hemorrhage, brain edema, acute infarct, mass lesion, or mass effect.  No other intra-axial abnormalities are seen, and the ventricles are within normal limits.  No abnormal extra-axial fluid collections or masses are identified.  No skull abnormalities are noted.
IMPRESSION: Negative non-contrast head CT.

## 2009-09-26 IMAGING — CR DG ABDOMEN 3V
1 series · 4 of 4 positions shown · non-contrast
Comparison: none

REASON FOR EXAM: Pain
COMMENTS:

[Series 1: view not recorded · 0.17mm/px · 4 of 4 slices shown]
[im 1/4]
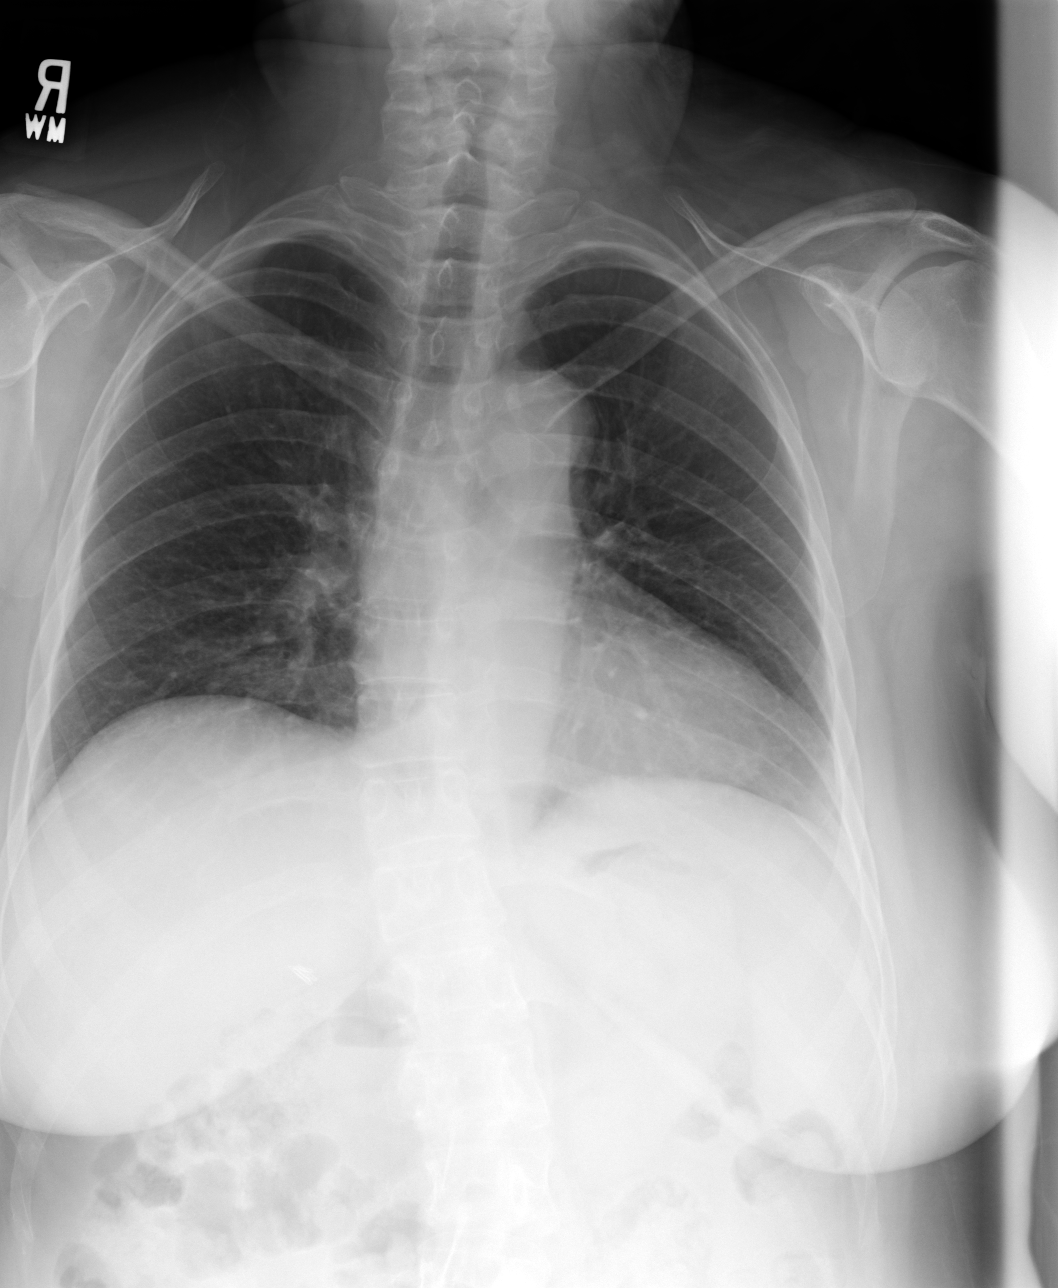
[im 2/4]
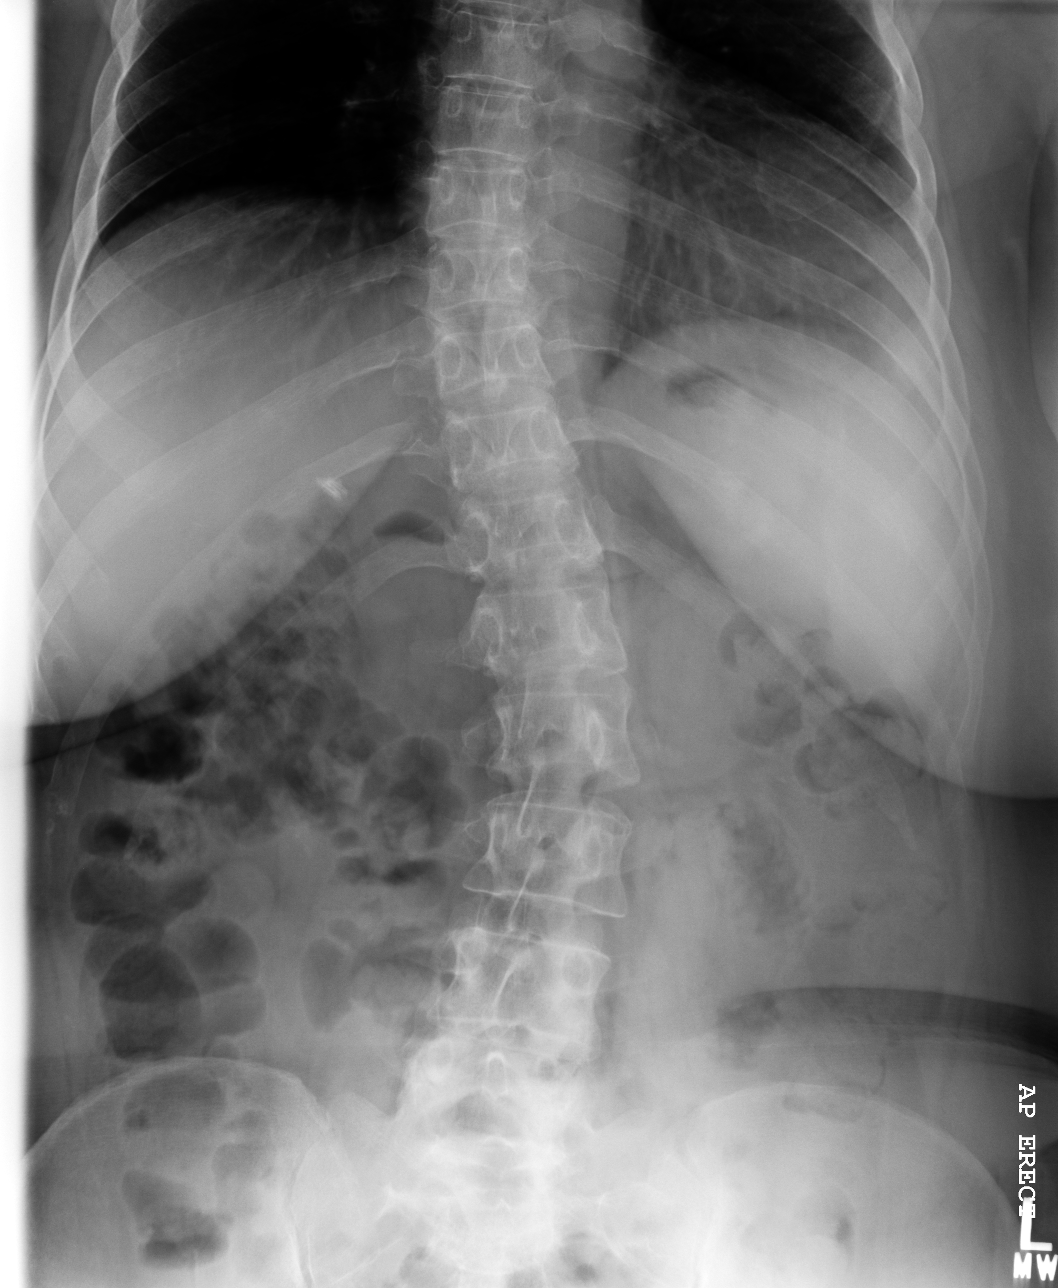
[im 3/4]
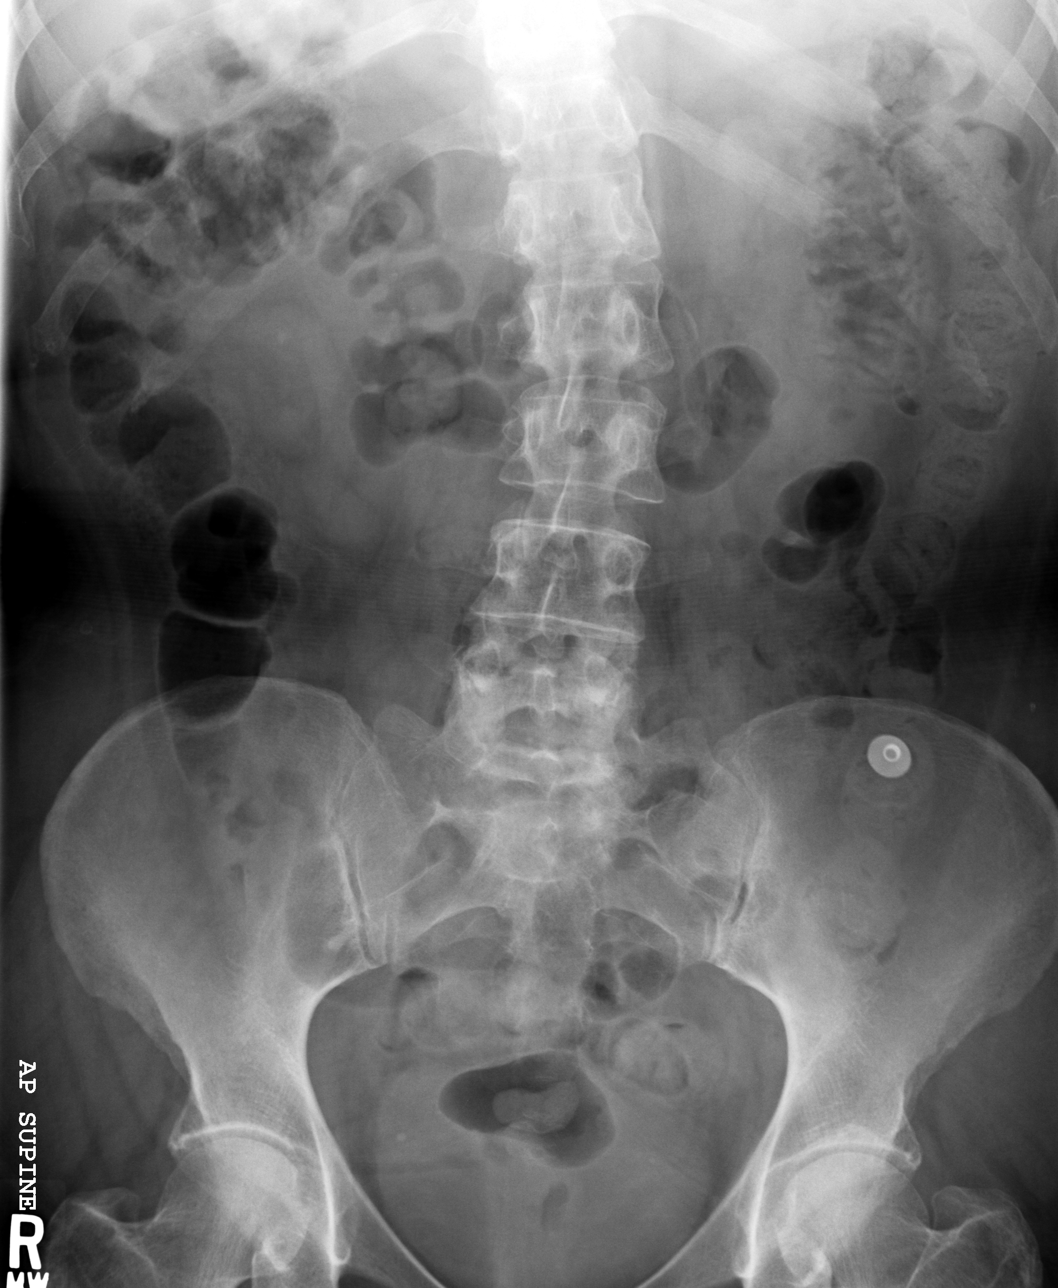
[im 4/4]
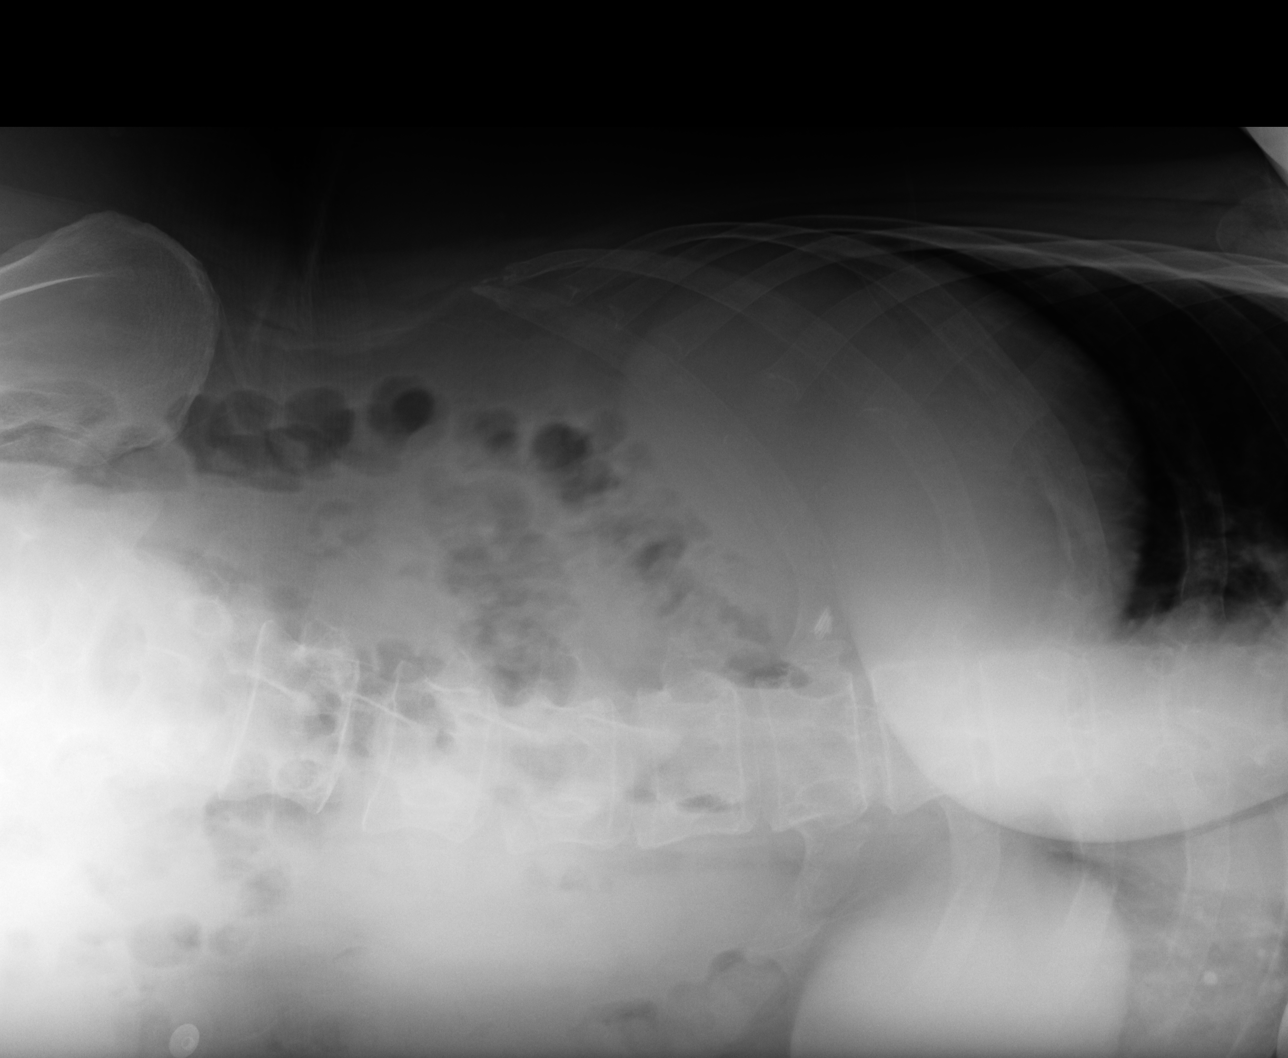

[4 of 4 positions shown; findings below may reference images not displayed]

PROCEDURE:     DXR - DXR ABDOMEN 3-WAY (INCL PA CXR)  - November 03, 2007  [DATE]

RESULT:     The lungs are adequately inflated and clear. The heart and
pulmonary vascularity exhibit no acute abnormality.

Within the abdomen, the bowel gas pattern is nonspecific. There is no
evidence of obstruction or perforation or ileus. There are surgical clips in
the gallbladder fossa. Gentle curvature of the lumbar spine, convexity
toward the LEFT is noted.
IMPRESSION: 1.  I do not see evidence of acute bowel obstruction. The bowel gas pattern
is nonspecific; however. There is density in the RIGHT upper quadrant of the
abdomen which likely reflects surgical clips from prior cholecystectomy.
2.  I do not see evidence of acute cardiopulmonary abnormality.
3.  Follow-up CT scanning of the chest and/or abdomen and pelvis is
recommended if the patient's symptoms persist. The specific symptoms are not
noted in the clinical history.

## 2009-09-29 ENCOUNTER — Ambulatory Visit: Payer: Self-pay | Admitting: Physician Assistant

## 2009-10-08 IMAGING — CT CT ABD-PELV W/O CM
1 of 2 series · 15 of 32 positions shown, 19 images · non-contrast
Comparison: none

REASON FOR EXAM: (1) L flank pain, kidney stone 1 month ago; (2) L flank
and LLQ pain
COMMENTS:

PROCEDURE:     CT  - CT ABDOMEN AND PELVIS W[DATE]  [DATE]
RESULT:     Comparison: Abdomen 3 way 11/02/2006.
TECHNIQUE: CT examination of the abdomen and pelvis was performed without
contrast. Collimation is 3 mm.

[Series 2: stone · axial · 0.67mm/px · z∈[-458,-36]mm · 15 of 158 slices shown, 19 images]
[im 11/158  soft-tissue]
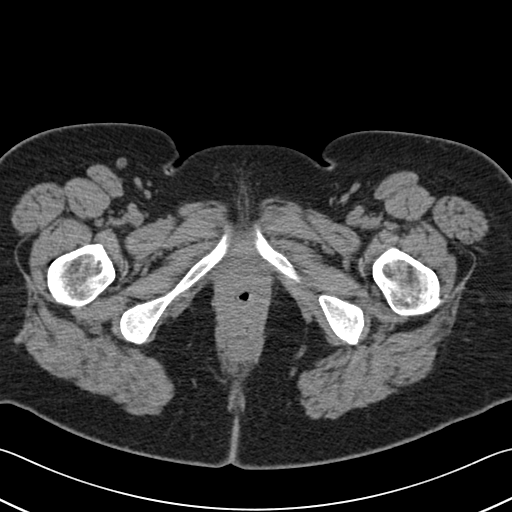
[im 11/158  bone]
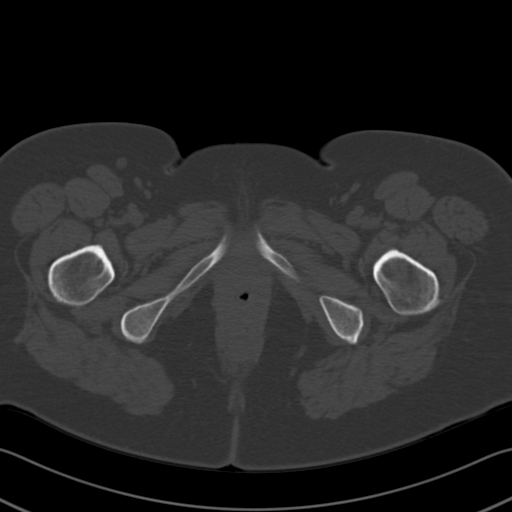
[im 22/158  soft-tissue]
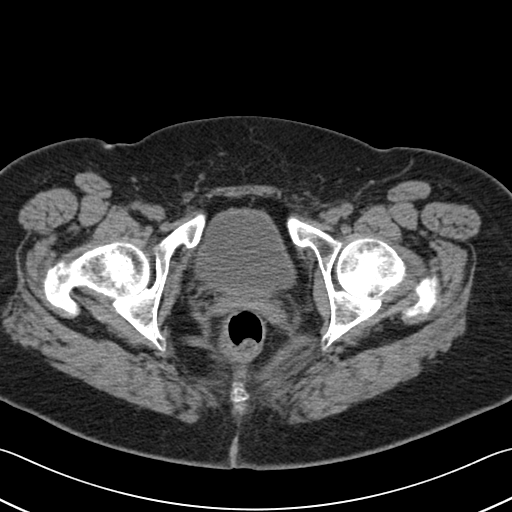
[im 33/158  soft-tissue]
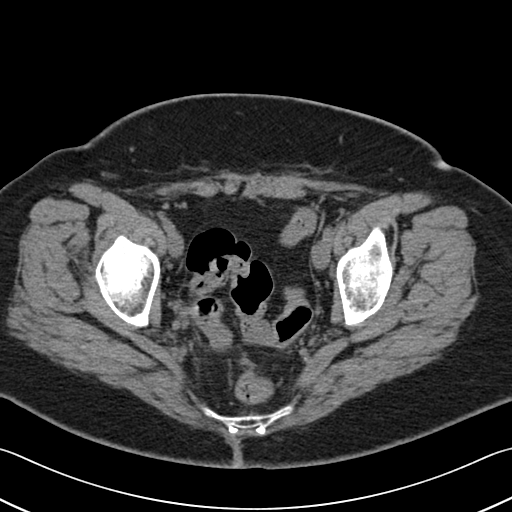
[im 44/158  soft-tissue]
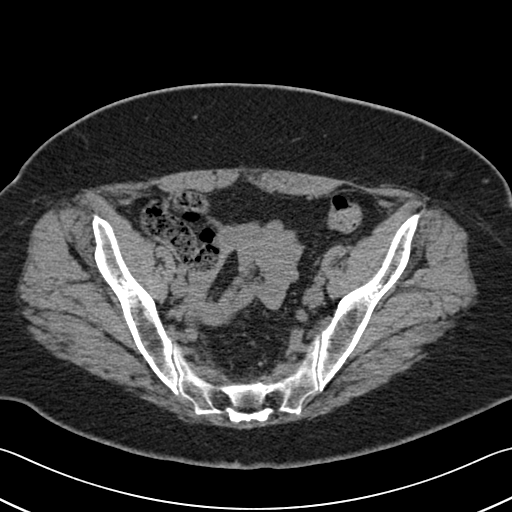
[im 55/158  soft-tissue]
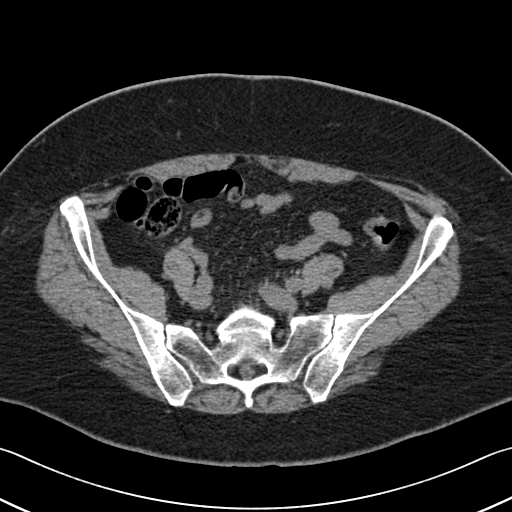
[im 65/158  soft-tissue]
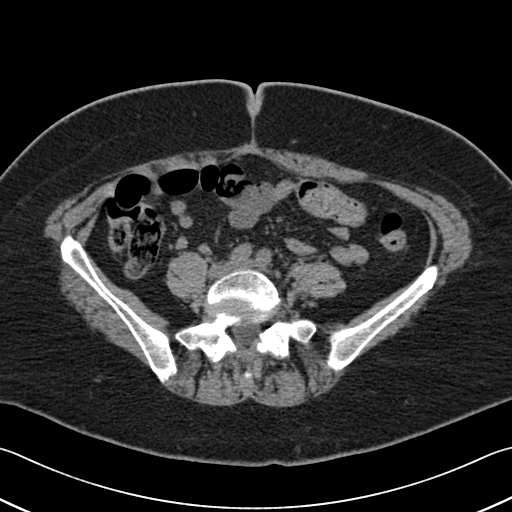
[im 82/158  soft-tissue]
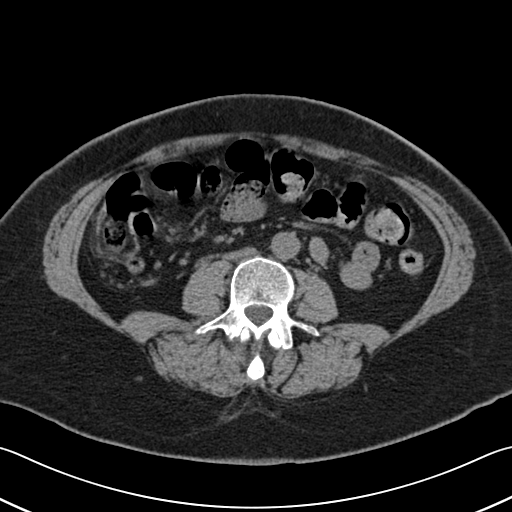
[im 93/158  soft-tissue]
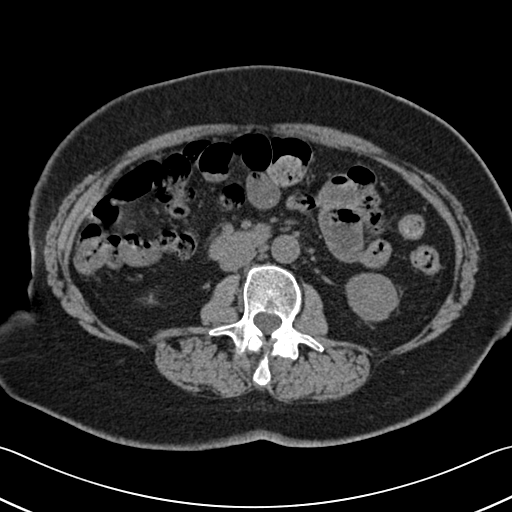
[im 103/158  soft-tissue]
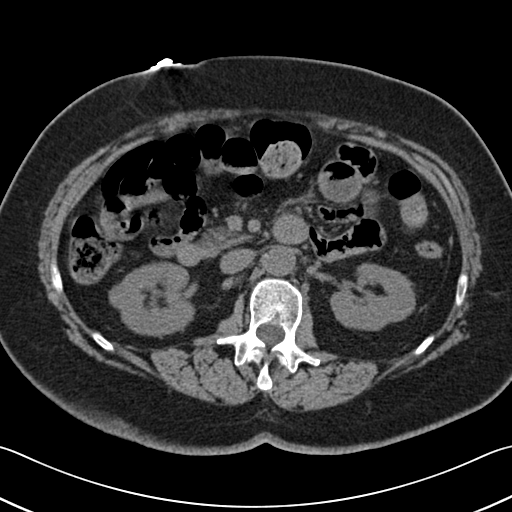
[im 103/158  bone]
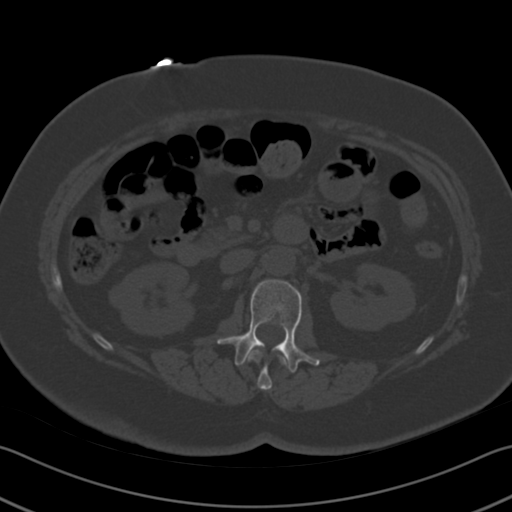
[im 114/158  soft-tissue]
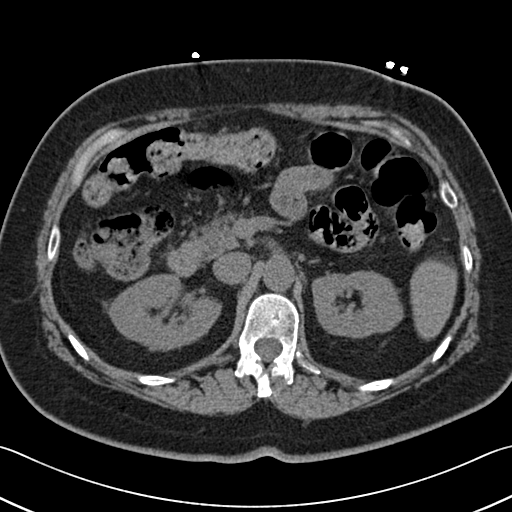
[im 125/158  soft-tissue]
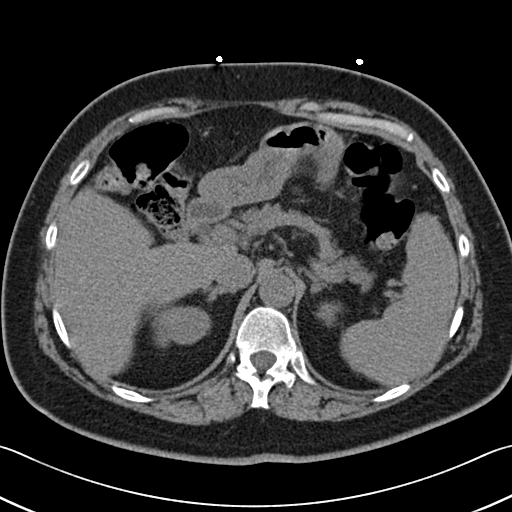
[im 136/158  soft-tissue]
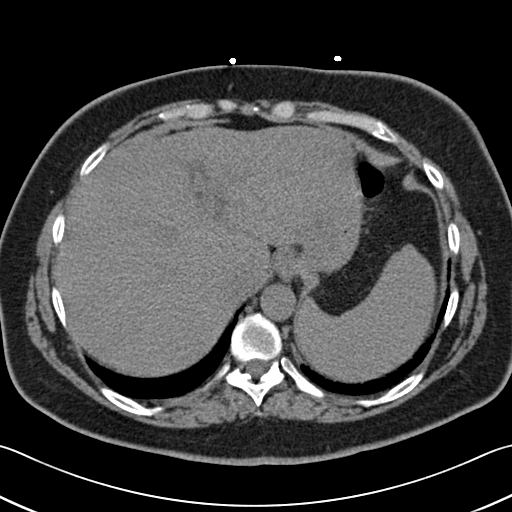
[im 136/158  lung]
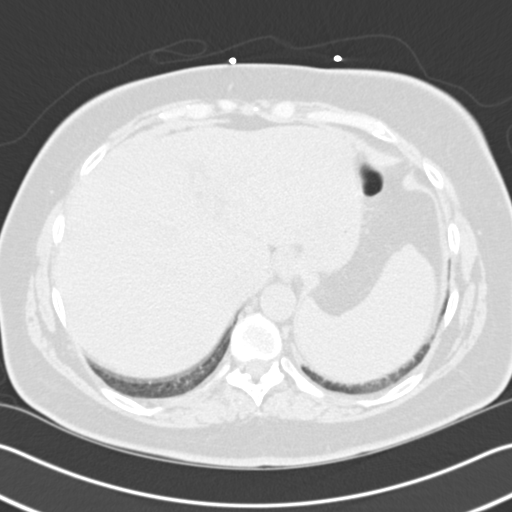
[im 141/158  lung]
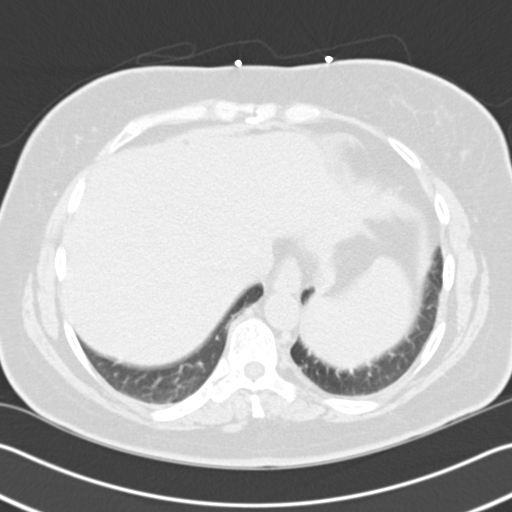
[im 147/158  soft-tissue]
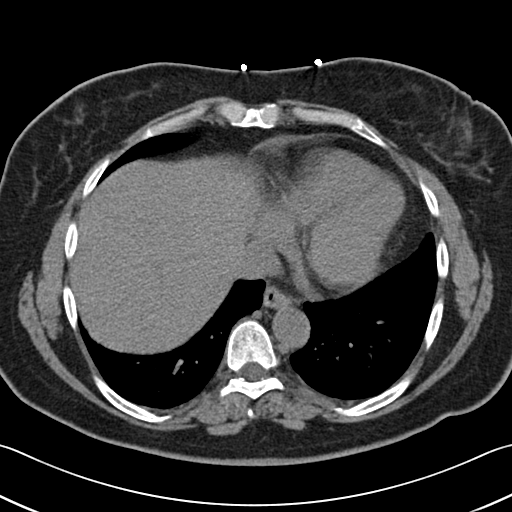
[im 147/158  lung]
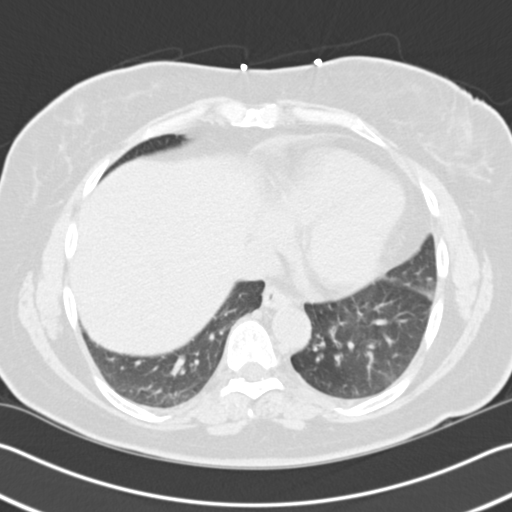
[im 152/158  lung]
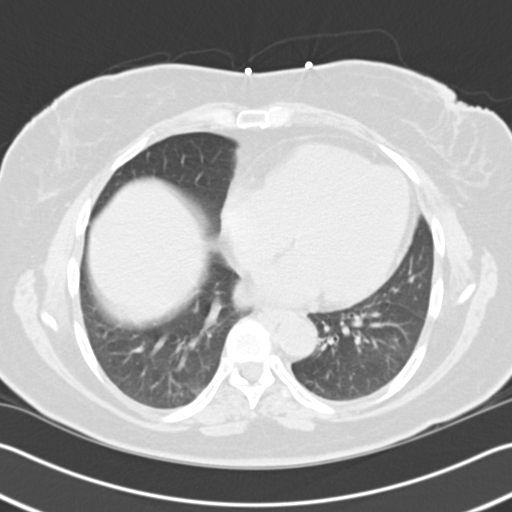

[15 of 32 positions shown; findings below may reference images not displayed]

FINDINGS: Limited evaluation of the lung bases is unremarkable.

Evaluation of the abdominal organs, bowels, and vessels is limited without
contrast. Patient is status post cholecystectomy. The liver, spleen,
pancreas, and adrenal glands are grossly unremarkable. One right and two
left nonobstructing renal stones are noted. The largest in the right low
pole measures 4 x 2 mm. No ureteral stone is seen. There is no
hydronephrosis or hydroureter. No bladder stone is seen.

There is no dilatation of the bowels. The appendix is unremarkable. There is
no significant intra abdominal or pelvic fat stranding. There is no
intraperitoneal free air. There is no significant free fluid. There are no
enlarged abdominal pelvic lymph nodes.
IMPRESSION: 1. Nonobstructed bilateral renal stones. No ureteral stone.

2. Evaluation of abdominal organs, bowels, and vessels is otherwise limited
without contrast. The appendix is unremarkable. There is no evidence of
bowel obstruction. There is no intra-abdominal or pelvic fat stranding.

Preliminary report was faxed to the emergency room by the night radiologist
shortly after the study was performed.

## 2009-10-12 IMAGING — CR DG ABDOMEN 2V
1 series · 3 of 3 positions shown · non-contrast
Comparison: none

REASON FOR EXAM: Abdominal pain
COMMENTS:

[Series 1: view not recorded · 0.17mm/px · 3 of 3 slices shown]
[im 1/3]
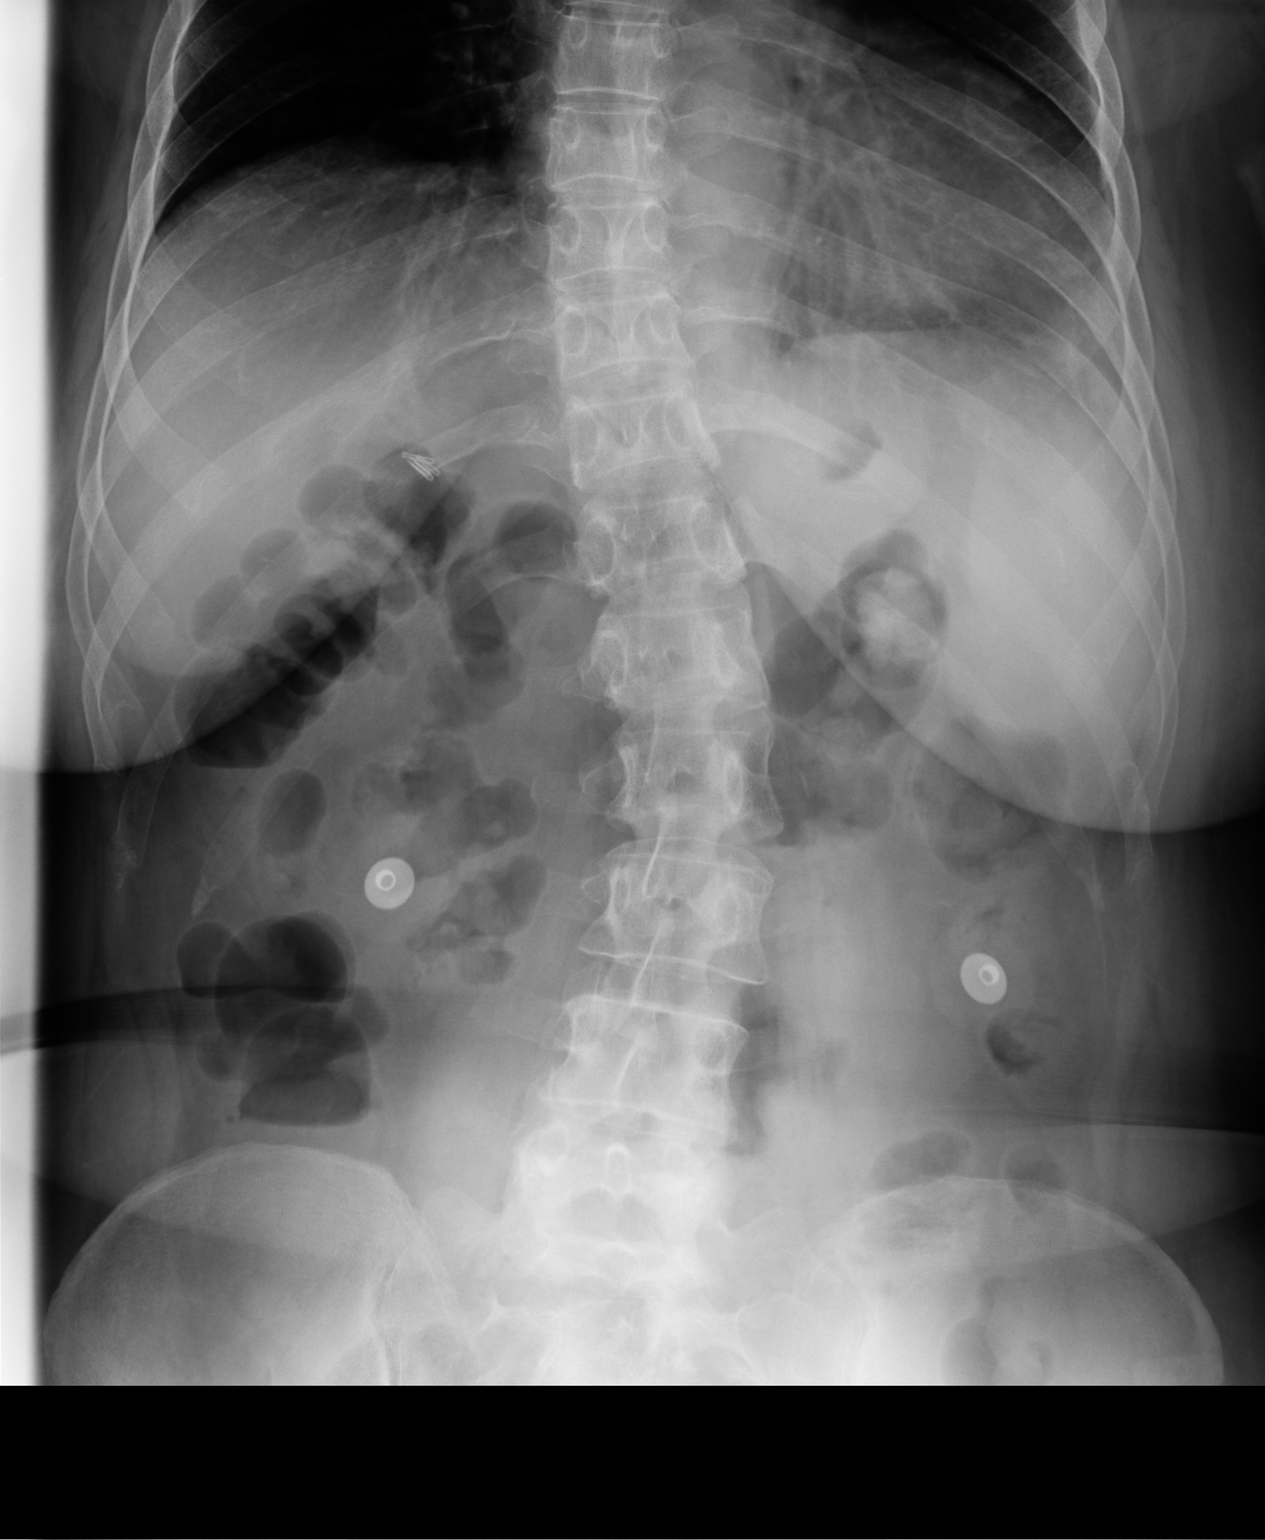
[im 2/3]
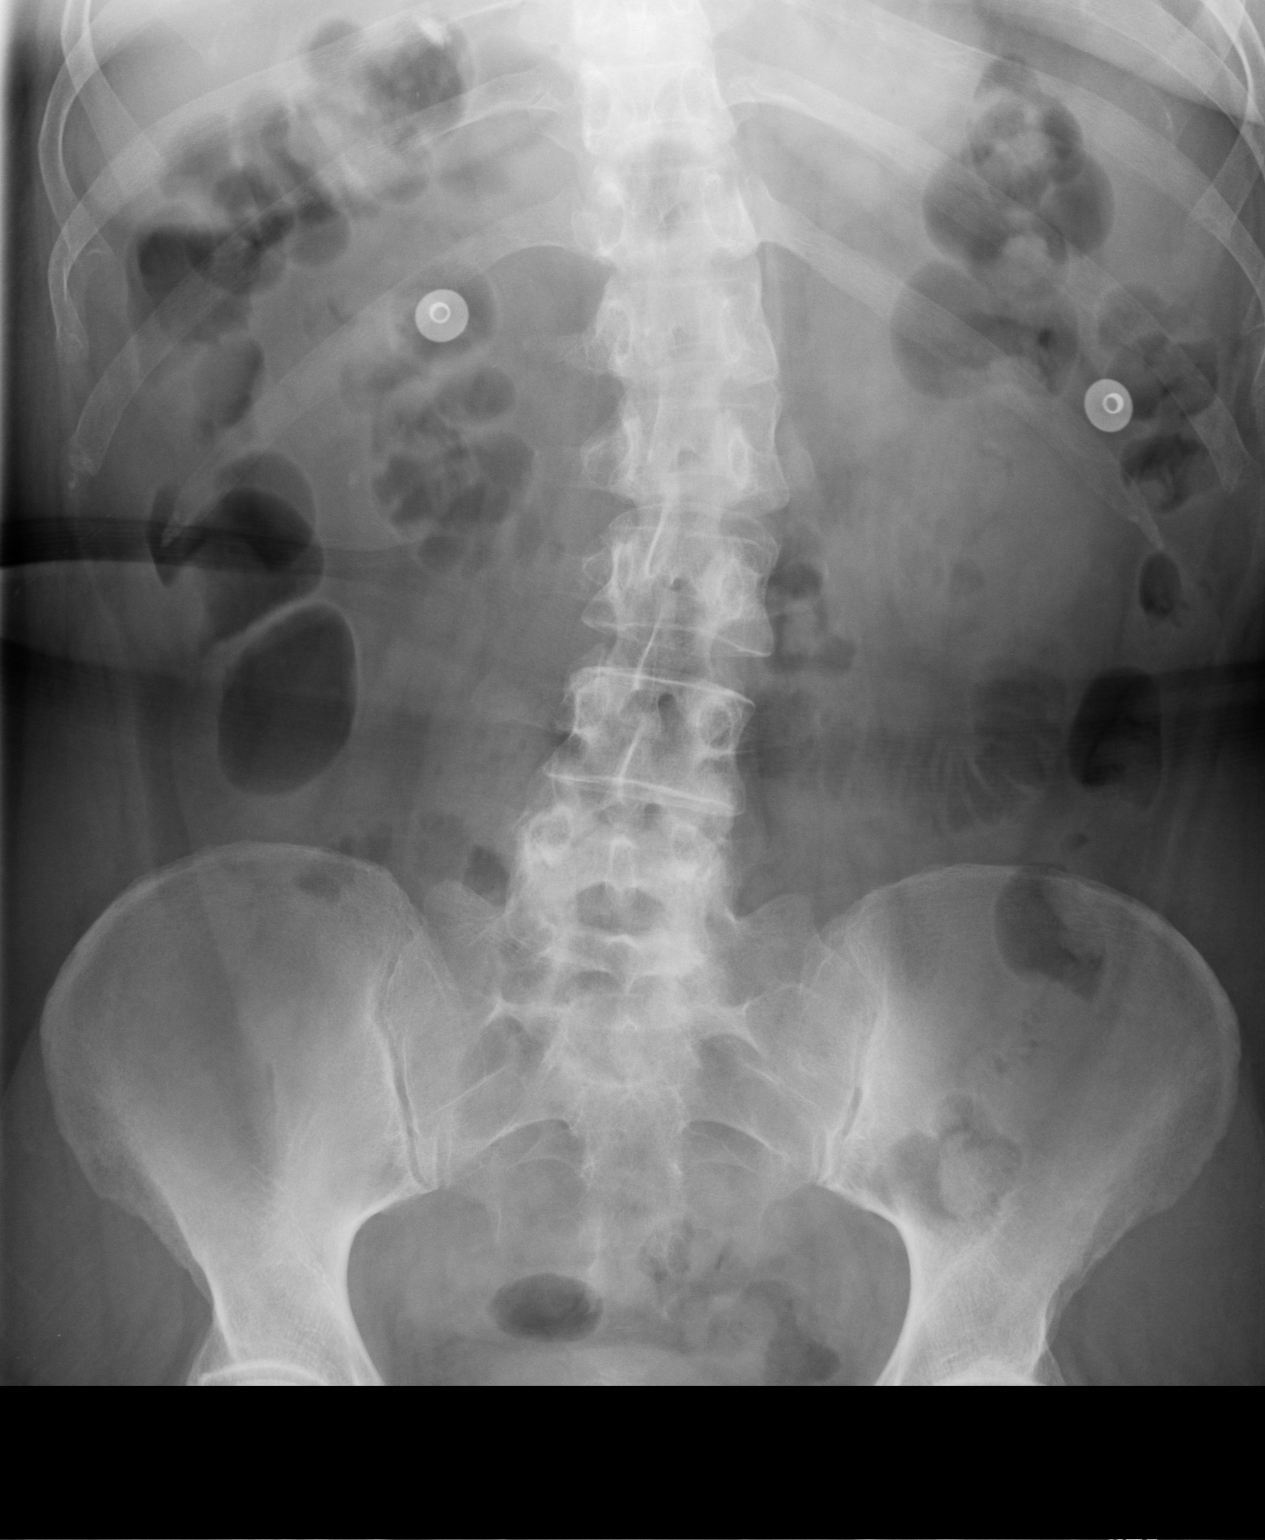
[im 3/3]
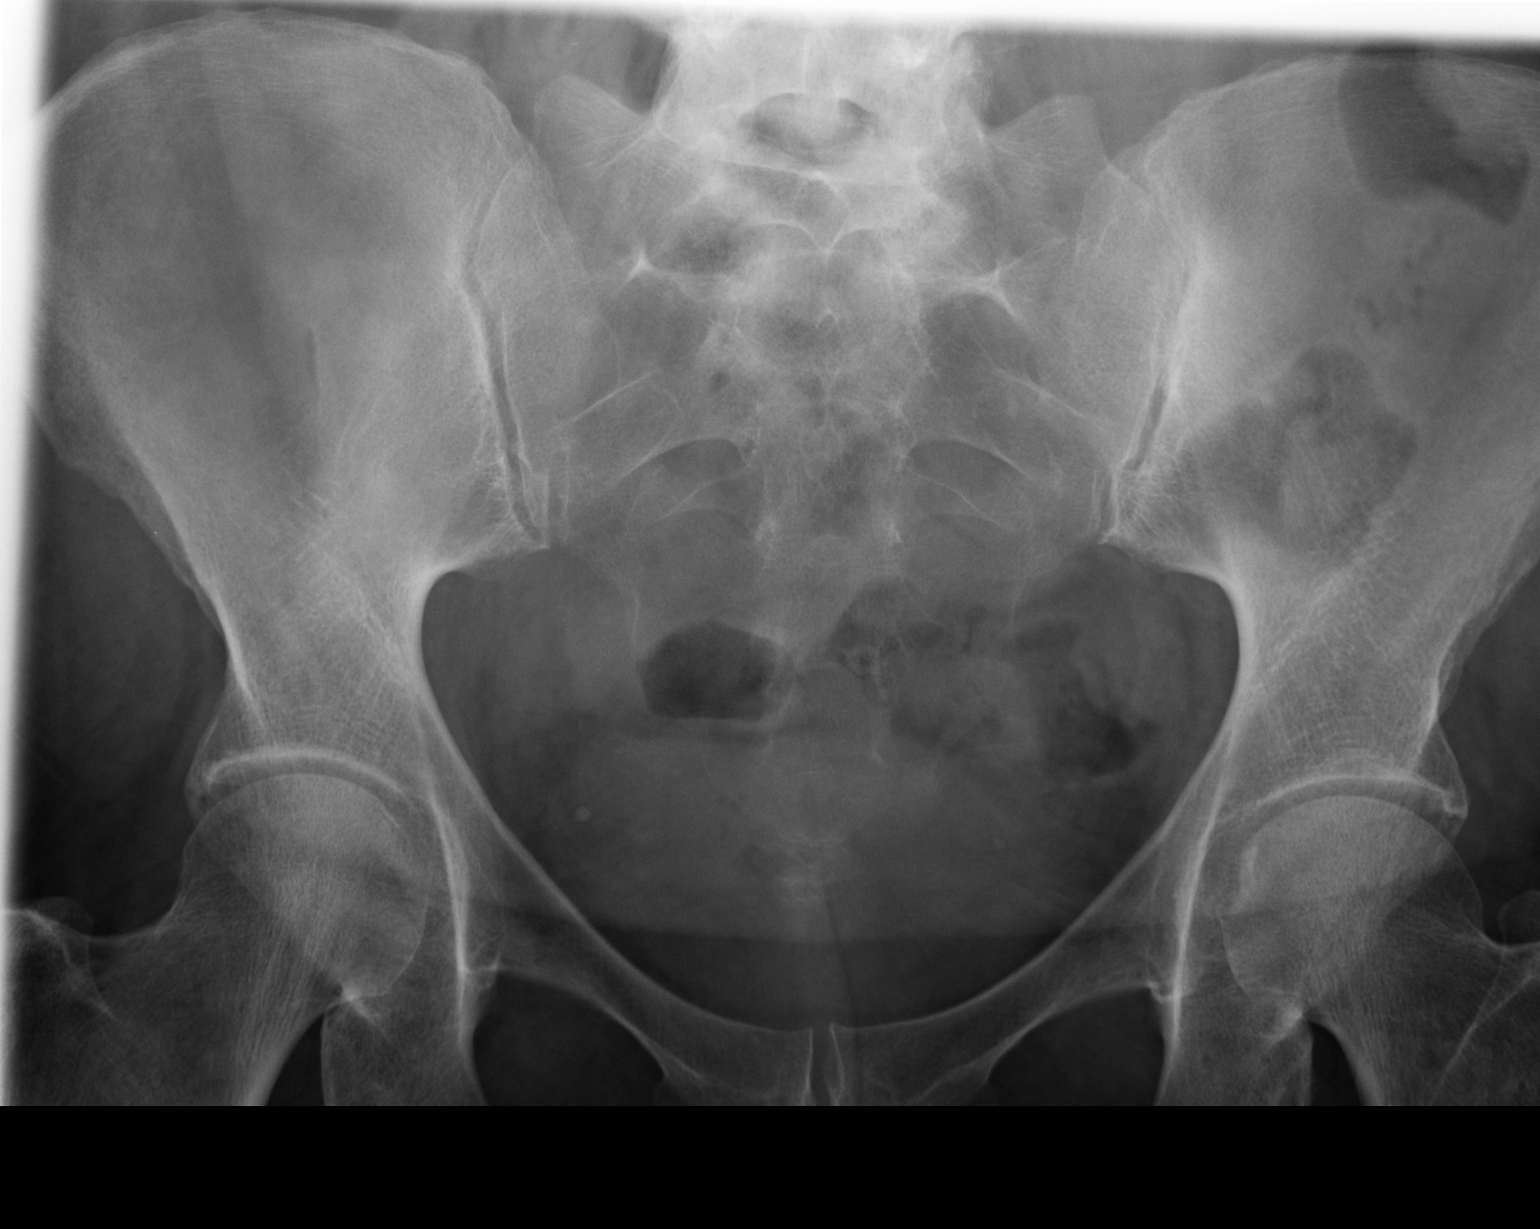

[3 of 3 positions shown; findings below may reference images not displayed]

PROCEDURE:     DXR - DXR ABDOMEN 2 V FLAT AND ERECT  - November 19, 2007  [DATE]

RESULT:     Flat and erect views were obtained.

There is no subdiaphragmatic free air. Gas is visualized in the large and
small bowel. There are a few nonspecific small bowel fluid levels. Such
findings are frequently seen on the basis of gastroenteritis or
intra-abdominal inflammation. No abnormal intra-abdominal calcifications are
seen. No acute bony abnormalities are seen. There is a mild lumbar
scoliosis, convexity to the LEFT.
IMPRESSION: 1.  There are a few, nonspecific small bowel fluid levels of the type
frequently seen with gastroenteritis or with intra-abdominal inflammation.
2.  No bowel obstruction is currently seen.
3.  There is noted a mild lumbar scoliosis.
4.  Post operative metallic clips are present in the region of the
gallbladder bed.

## 2009-10-12 IMAGING — CR DG CHEST 1V PORT
1 series · 1 of 1 positions shown · non-contrast
Comparison: none

REASON FOR EXAM: Chest pain
COMMENTS:

PROCEDURE:     DXR - DXR PORTABLE CHEST SINGLE VIEW  - November 19, 2007  [DATE]
RESULT:     The lung fields are clear. The heart and pulmonary vasculature
show no significant abnormalities. No significant osseous abnormalities are
seen.

[view not recorded]
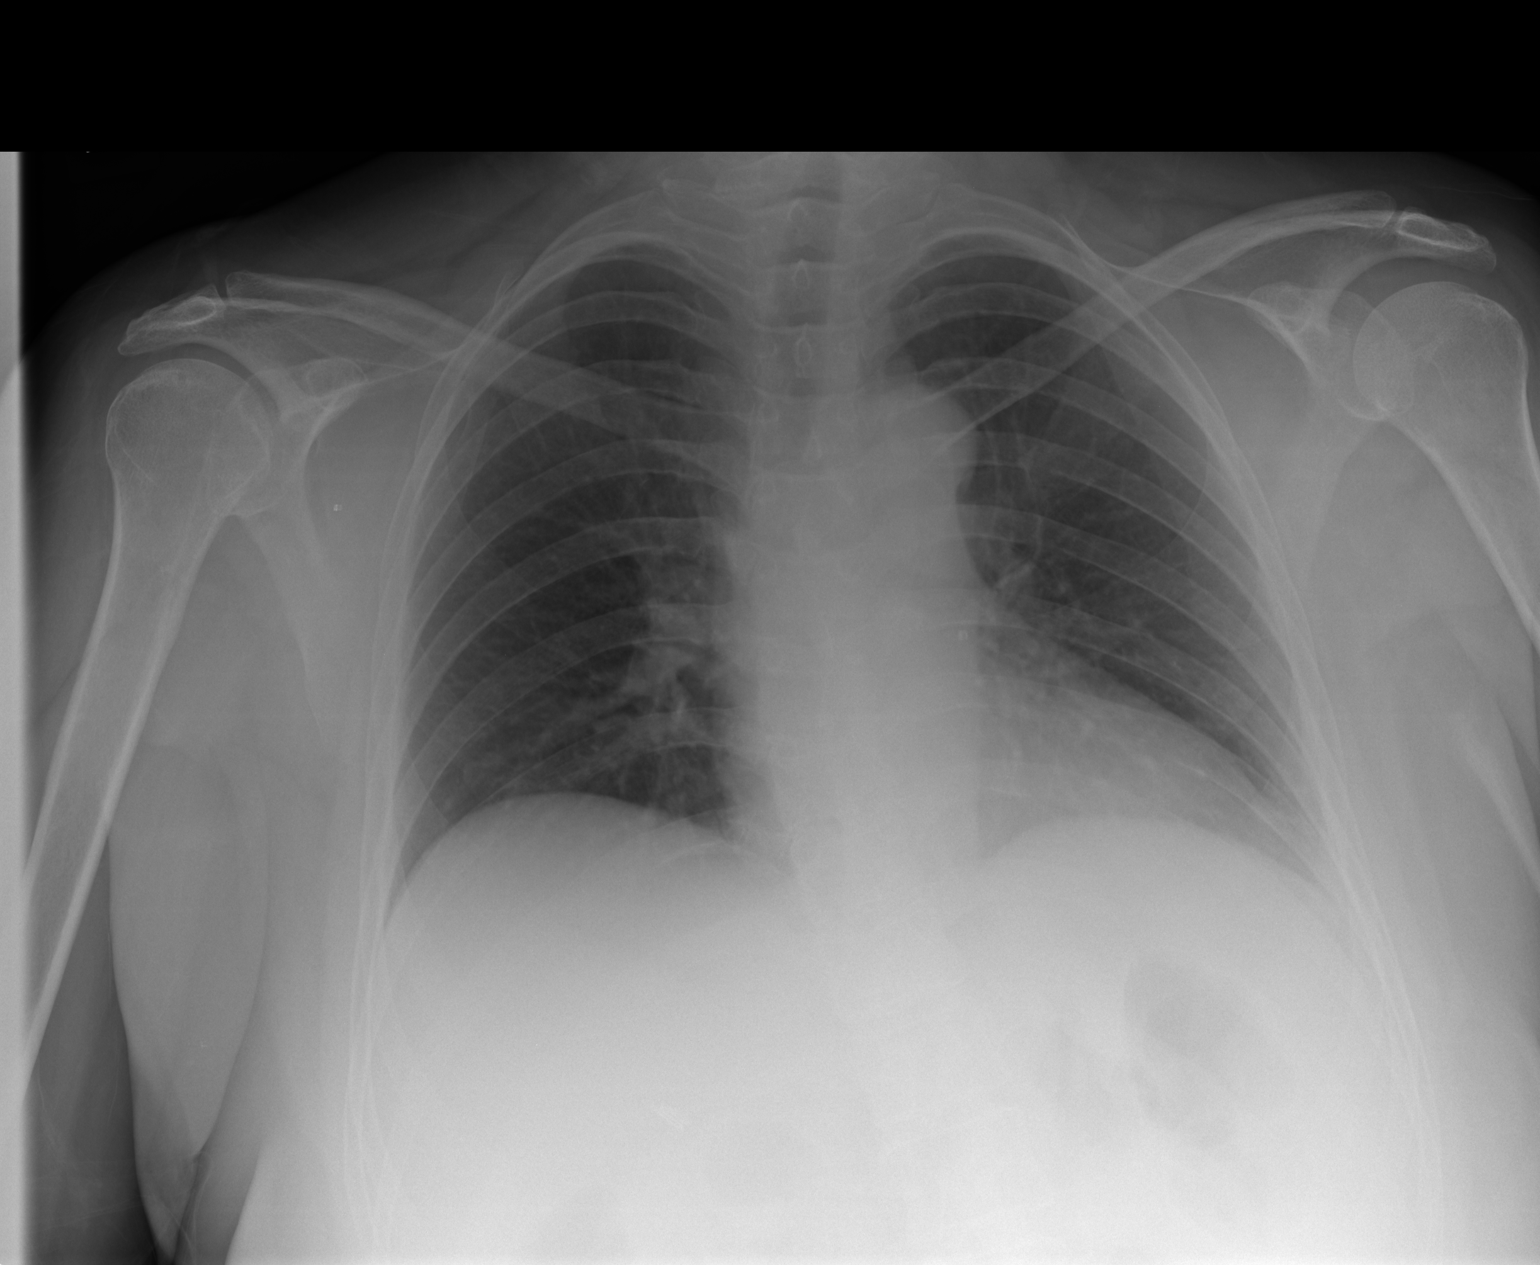

[1 of 1 positions shown; findings below may reference images not displayed]

IMPRESSION: No acute changes are identified.

## 2009-10-13 IMAGING — CR DG ABDOMEN 1V
1 series · 1 of 1 positions shown · non-contrast
Comparison: none

REASON FOR EXAM: ileus
COMMENTS:

[view not recorded]
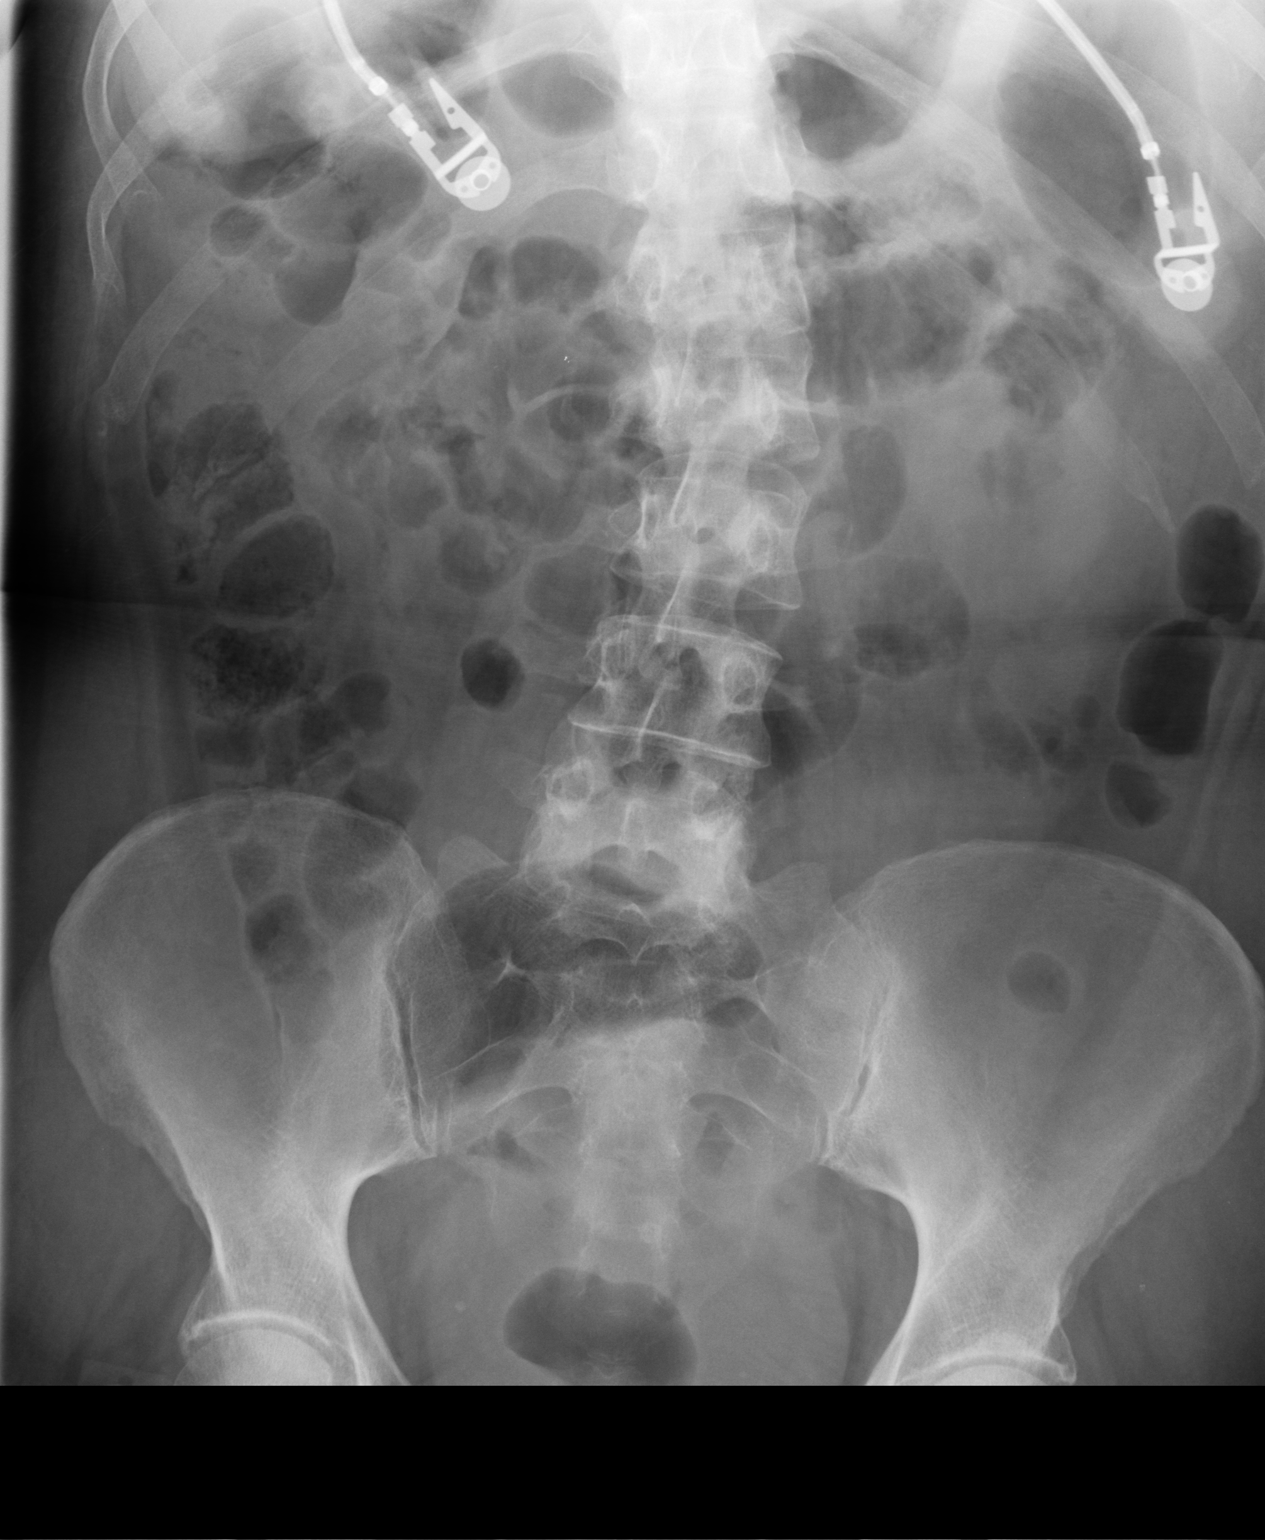

[1 of 1 positions shown; findings below may reference images not displayed]

PROCEDURE:     DXR - DXR KIDNEY URETER BLADDER  - November 20, 2007  [DATE]

RESULT:     An AP supine view of the abdomen was obtained. Gas is visualized
in both the large and small bowel. No significantly dilated loops of bowel
are seen. No evidence for bowel obstruction is identified. In the absence of
lateral decubitus or erect view, the small bowel cannot be evaluated for
residual fluid levels. There a mild lumbar scoliosis. No abnormal
intraabdominal calcifications are seen. Postoperative metallic clips are
present in the region of the gallbladder bed.
IMPRESSION: 1.     In the AP supine view, the bowel gas pattern shows no specific
abnormalities.

## 2009-10-26 ENCOUNTER — Ambulatory Visit: Payer: Self-pay | Admitting: Pain Medicine

## 2009-10-26 ENCOUNTER — Encounter: Payer: Self-pay | Admitting: Family Medicine

## 2009-11-07 ENCOUNTER — Ambulatory Visit: Payer: Self-pay | Admitting: Pain Medicine

## 2009-11-22 IMAGING — MR MRI LUMBAR SPINE WITHOUT CONTRAST
5 series · 35 of 48 positions shown · IV contrast (gadolinium)
Comparison: none

REASON FOR EXAM: low back pain
COMMENTS:

PROCEDURE:     MR  - MR LUMBAR SPINE WO CONTRAST  - December 30, 2007  [DATE]
RESULT:     Comparison: No available comparison exam.
Procedure: Multiplanar, multisequence MR examination of the lumbar spine was
performed without gadolinium contrast.

[Series 2: T2 · sagittal · 4.0mm · 0.94mm/px · 6 of 16 slices shown (1 of 2)]
[im 1/16]
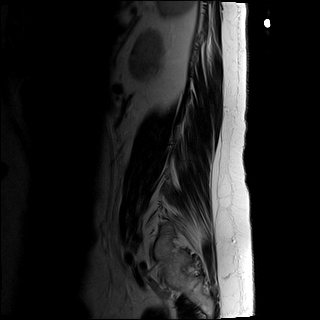
[im 4/16]
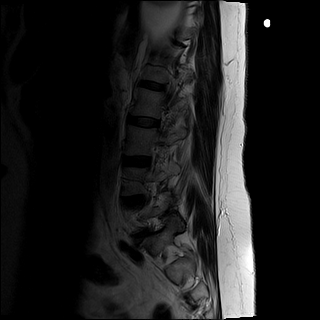
[im 7/16]
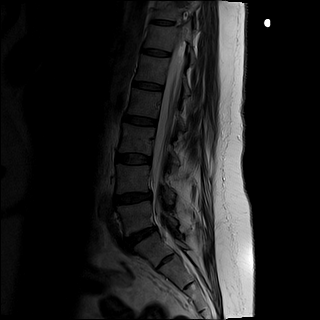
[im 10/16]
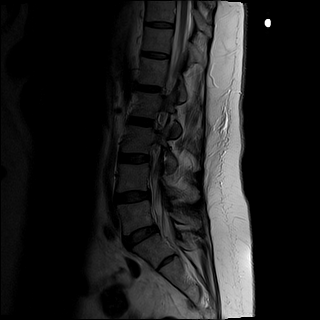
[im 13/16]
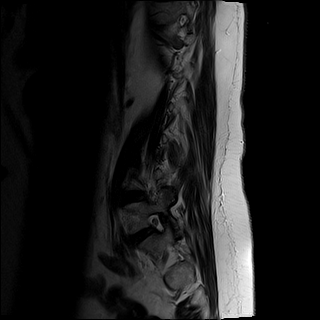
[im 16/16]
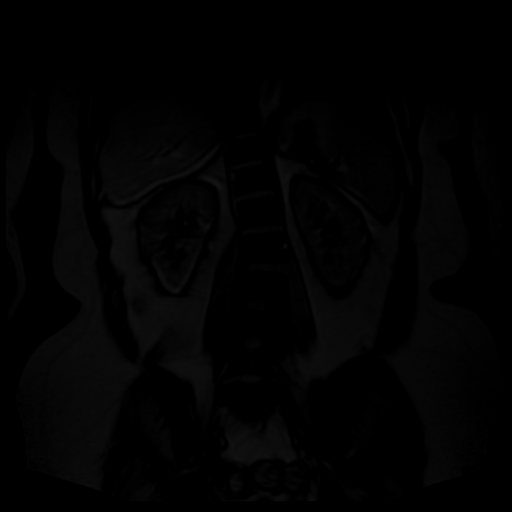

[Series 3: T1 · sagittal · 4.0mm · 0.94mm/px · 7 of 16 slices shown (1 of 2)]
[im 1/16]
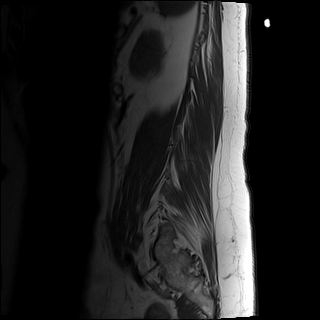
[im 3/16]
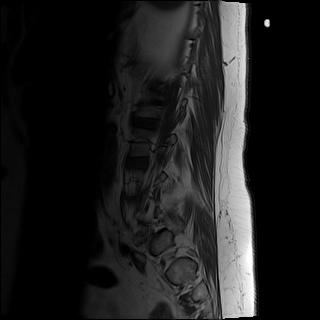
[im 6/16]
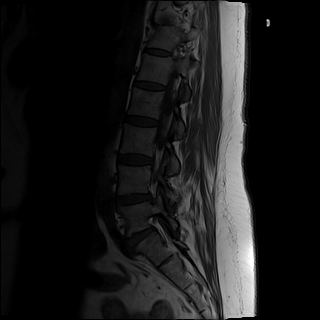
[im 8/16]
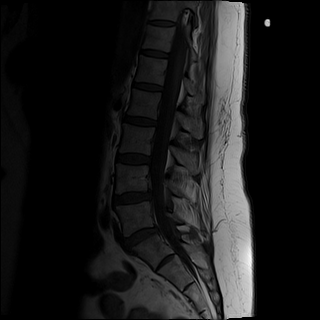
[im 11/16]
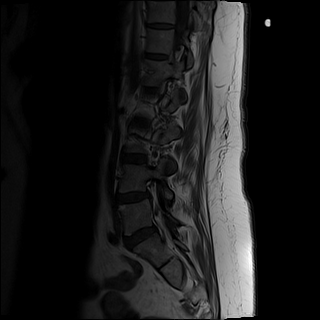
[im 13/16]
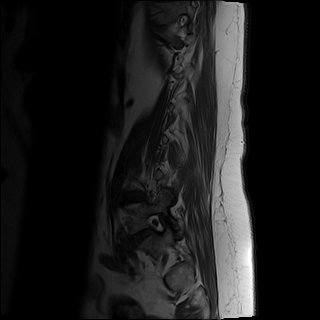
[im 16/16]
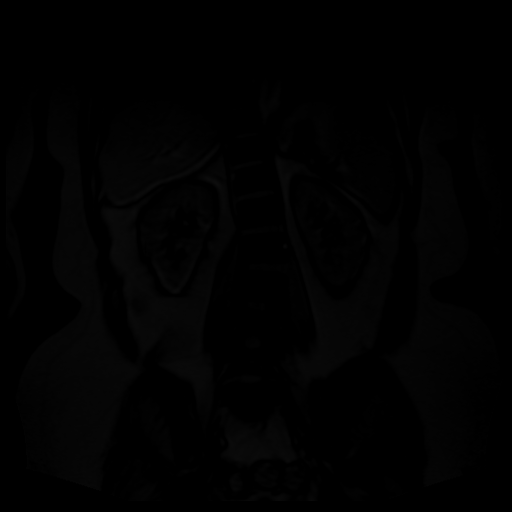

[Series 4: T2 fat-sat · sagittal · 4.0mm · 0.94mm/px · 6 of 16 slices shown]
[im 1/16]
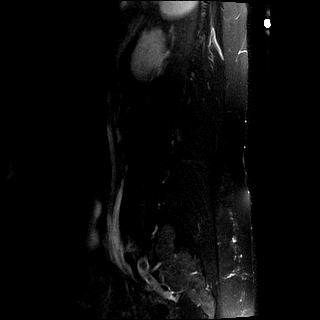
[im 3/16]
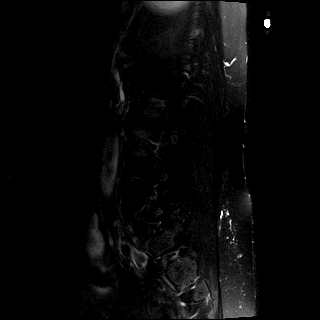
[im 6/16]
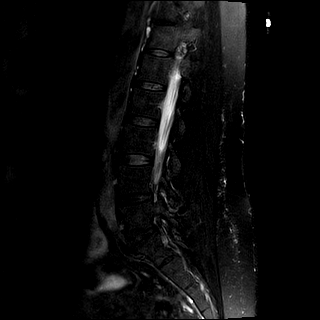
[im 8/16]
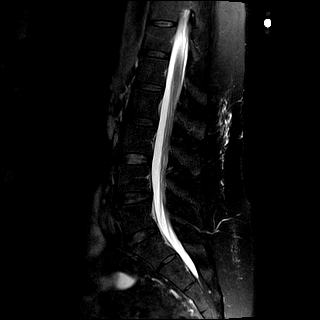
[im 11/16]
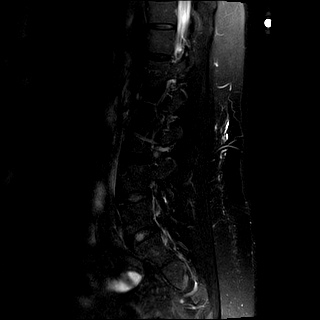
[im 13/16]
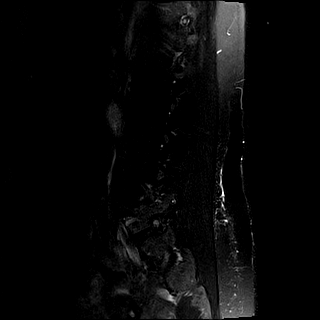

[Series 5: T2 · axial · 5.0mm · 0.80mm/px · z∈[-154,+99]mm · 8 of 32 slices shown (2 of 2)]
[im 1/32]
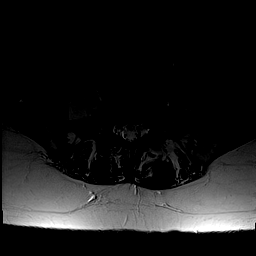
[im 5/32]
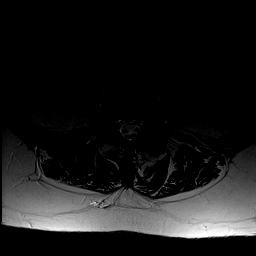
[im 10/32]
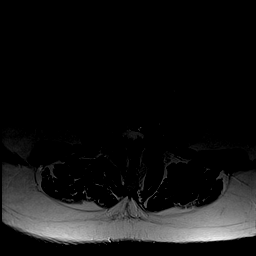
[im 15/32]
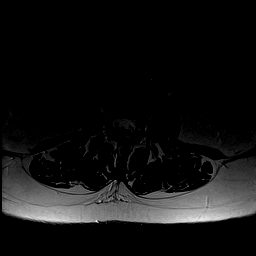
[im 17/32]
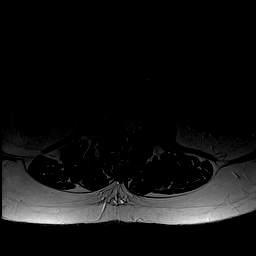
[im 22/32]
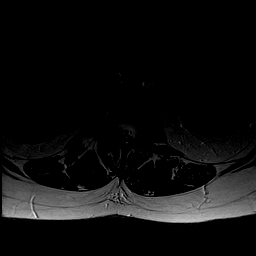
[im 27/32]
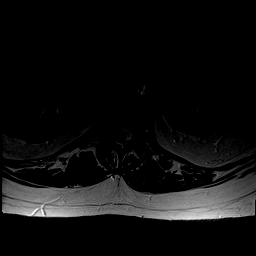
[im 32/32]
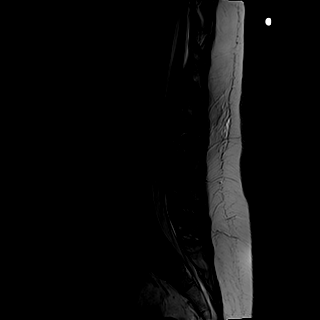

[Series 6: T1 · axial · 5.0mm · 0.40mm/px · z∈[-154,+99]mm · 8 of 32 slices shown (2 of 2)]
[im 1/32]
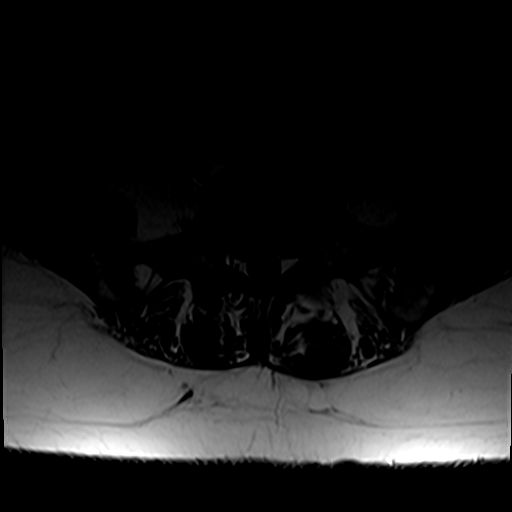
[im 5/32]
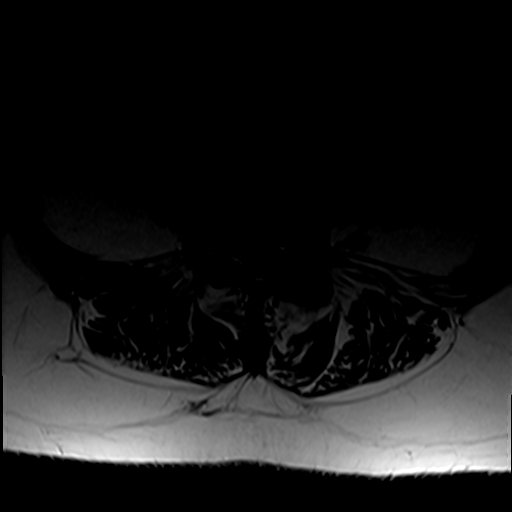
[im 10/32]
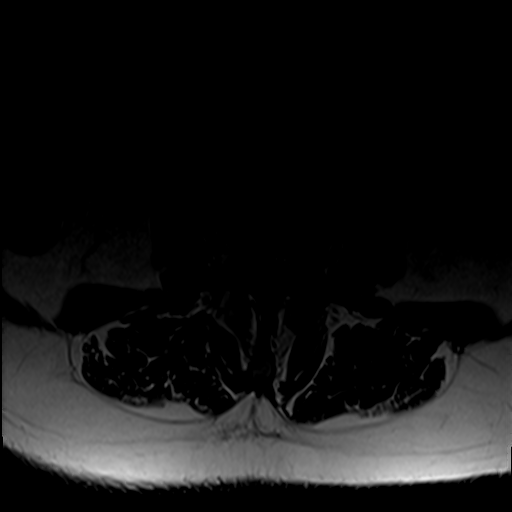
[im 15/32]
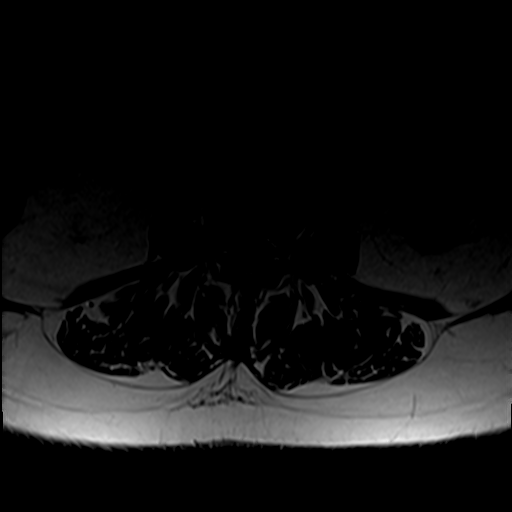
[im 17/32]
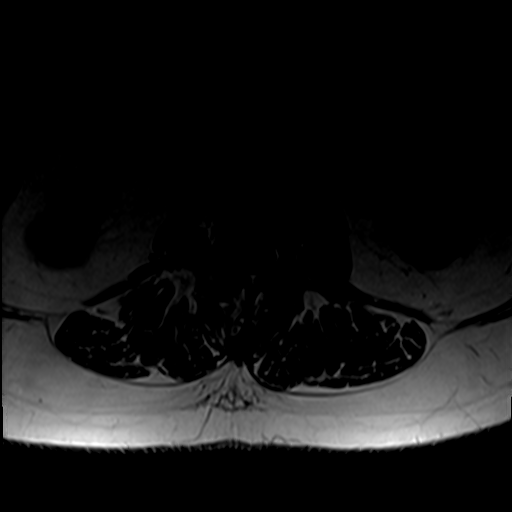
[im 22/32]
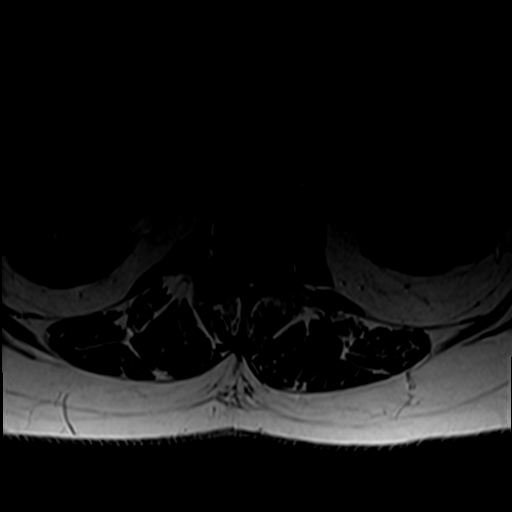
[im 27/32]
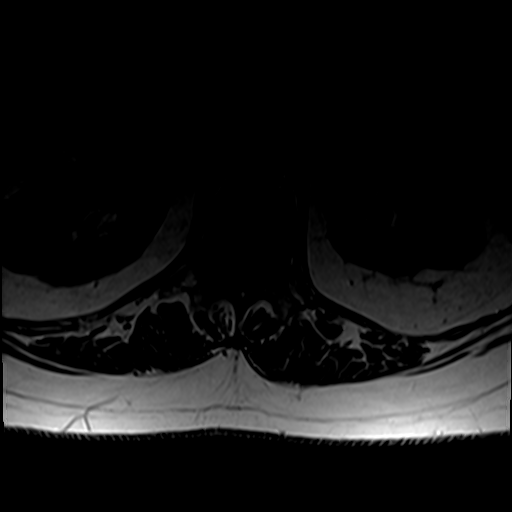
[im 32/32]
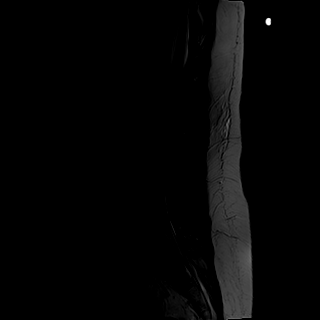

[35 of 48 positions shown; findings below may reference images not displayed]

FINDINGS: There is mild levoconvex curvature of the lumbar spine without malalignment.
The conus terminates at the L1-L2 level and is unremarkable. There is no
significant bone marrow abnormality to suggest an acute fracture or a
suspicious osseous lesion.

L4-L5: There is disc desiccation without significant disc height loss. There
is minimal central disc protrusion. Mild degenerative facet changes are
noted. There is no significant canal or neural foraminal narrowing.

L5-S1: There is disc desiccation without significant disc height loss. There
is no significant disc herniation. Mild to moderate degenerative facet
changes are noted. There is no significant canal or neural foraminal
narrowing.

Disc desiccation is seen at other lumbar levels without significant disc
herniation, canal or neural foraminal narrowing.
IMPRESSION: 1. Lumbar degenerative changes without significant canal or neural foraminal
narrowing.

## 2009-11-24 ENCOUNTER — Ambulatory Visit: Payer: Self-pay | Admitting: Pain Medicine

## 2009-11-29 IMAGING — MR MRI CERVICAL SPINE WITHOUT CONTRAST
5 series · 41 of 48 positions shown · non-contrast
Comparison: none

REASON FOR EXAM: Radiculitis
COMMENTS:

PROCEDURE:     MR  - MR CERVICAL SPINE WO CONT  - January 06, 2008  [DATE]
RESULT:     The patient has a history of neck pain with extension into the
LEFT upper extremity.
TECHNIQUE: Multiplanar/multisequence imaging of the cervical spine is
obtained.
There are no recent studies available for comparison.

[Series 3: T2 · sagittal · 3.0mm · 0.88mm/px · 8 of 14 slices shown]
[im 1/14]
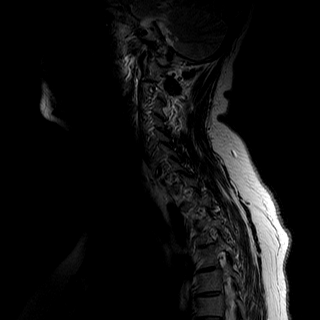
[im 2/14]
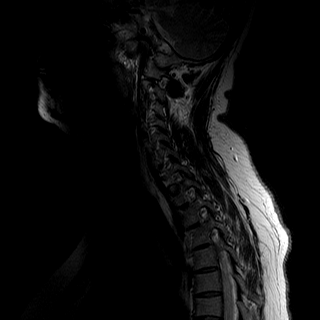
[im 4/14]
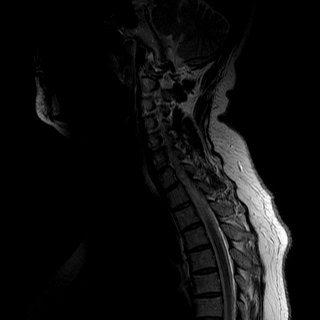
[im 6/14]
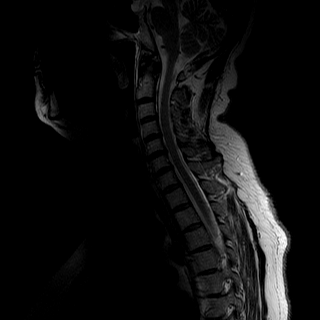
[im 8/14]
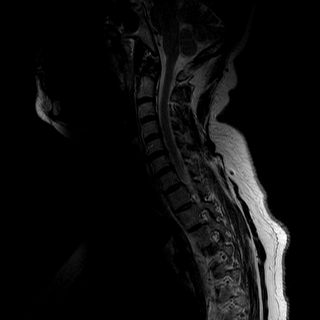
[im 10/14]
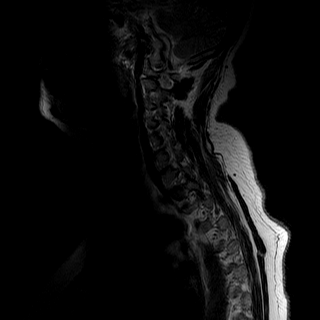
[im 12/14]
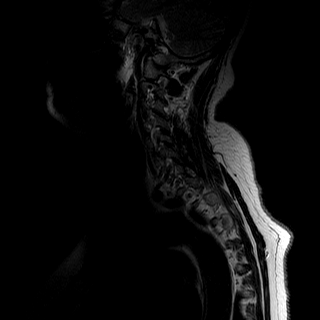
[im 14/14]
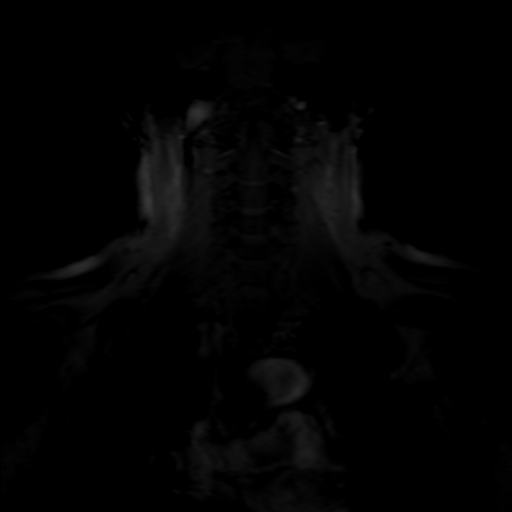

[Series 4: T1 · sagittal · 3.0mm · 0.55mm/px · 7 of 14 slices shown]
[im 1/14]
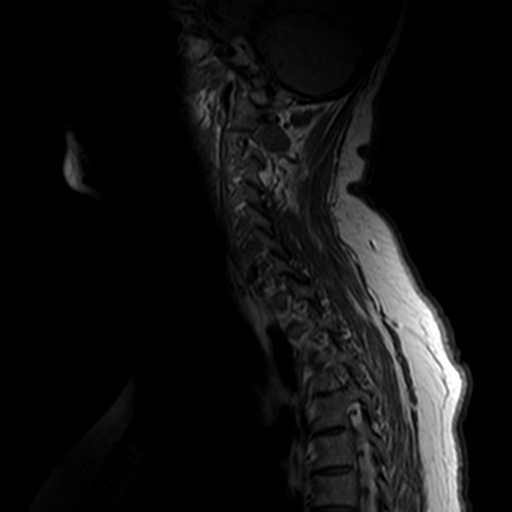
[im 3/14]
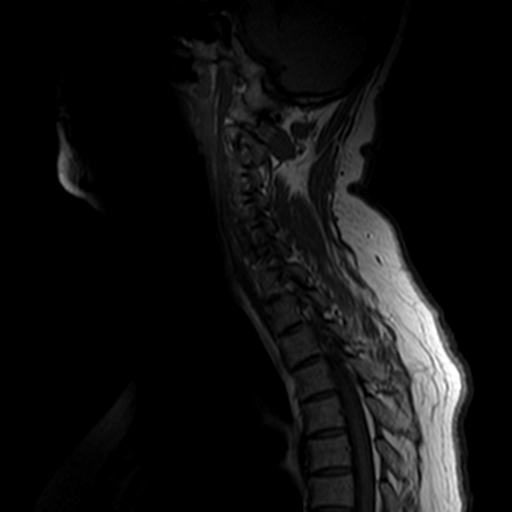
[im 5/14]
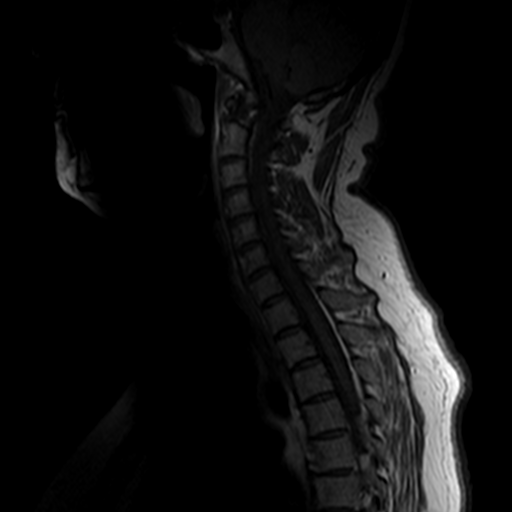
[im 7/14]
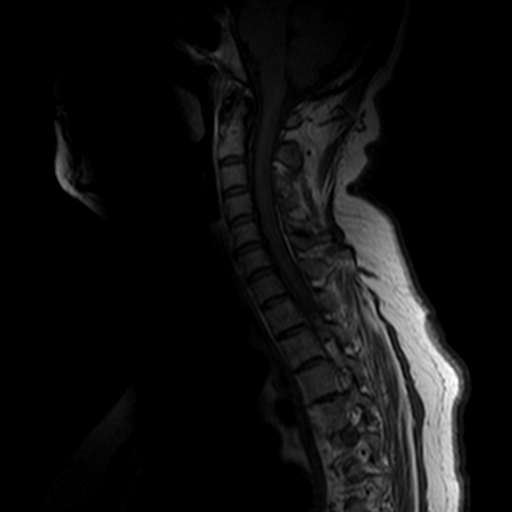
[im 9/14]
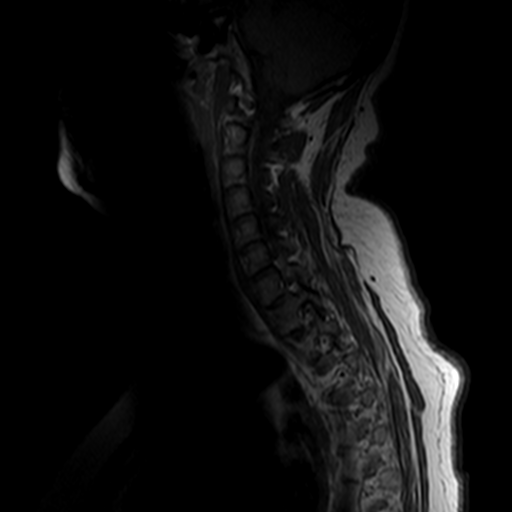
[im 11/14]
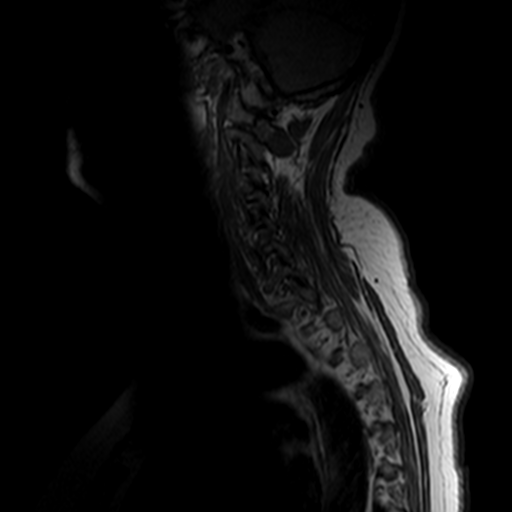
[im 14/14]
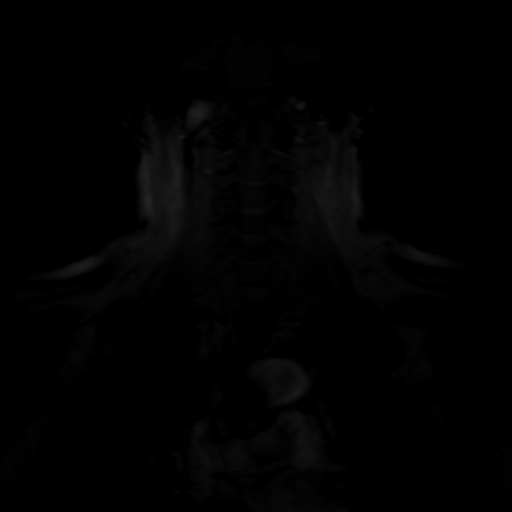

[Series 6: T2 fat-sat · sagittal · 3.0mm · 1.09mm/px · 7 of 14 slices shown]
[im 1/14]
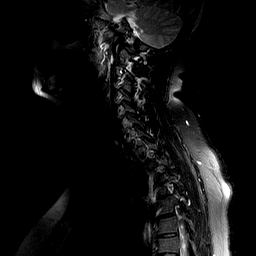
[im 3/14]
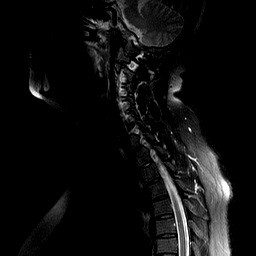
[im 5/14]
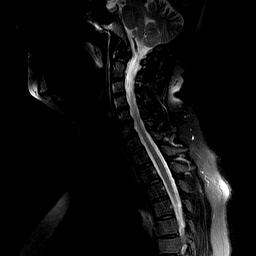
[im 7/14]
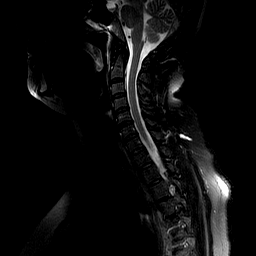
[im 9/14]
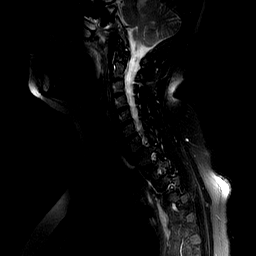
[im 11/14]
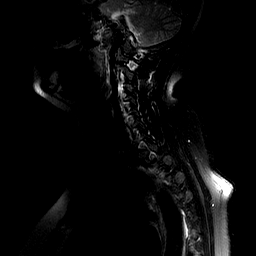
[im 14/14]
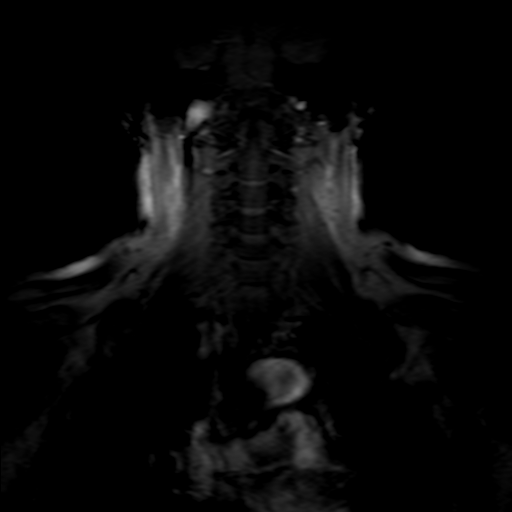

[Series 5005: t1_se_ax ((person_name))_cv_1 · axial · 3.0mm · 0.86mm/px · z∈[-75,+128]mm · 11 of 26 slices shown]
[im 1/26]
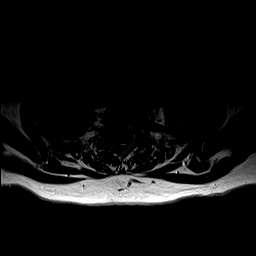
[im 3/26]
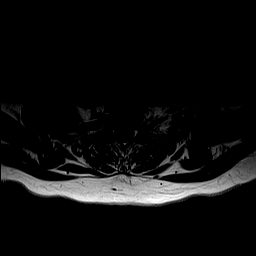
[im 5/26]
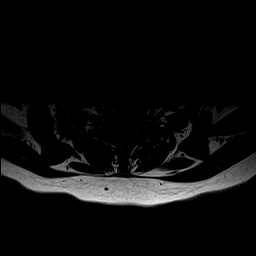
[im 7/26]
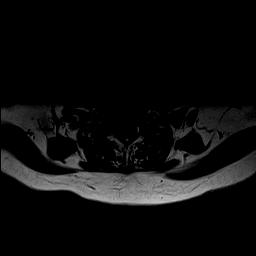
[im 9/26]
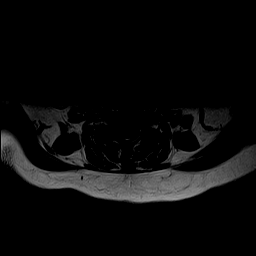
[im 11/26]
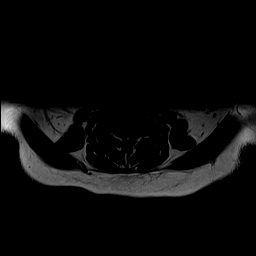
[im 13/26]
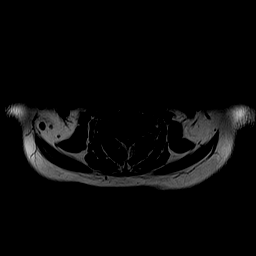
[im 15/26]
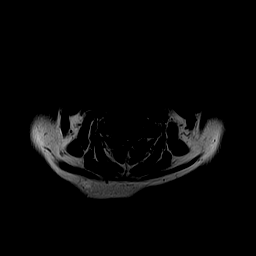
[im 17/26]
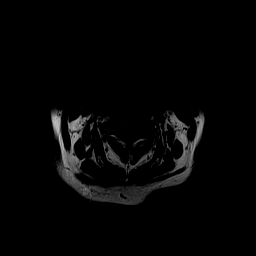
[im 21/26]
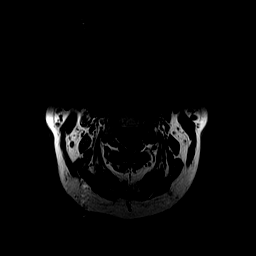
[im 26/26]
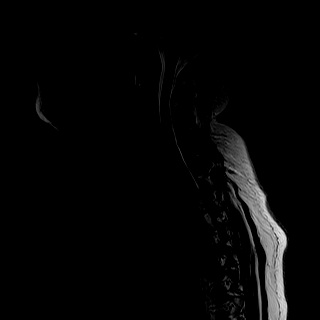

[Series 5007: t2_fl2d_tra (try 11-5)_cv_1 · axial · 3.0mm · 0.86mm/px · z∈[-75,+128]mm · 8 of 26 slices shown]
[im 1/26]
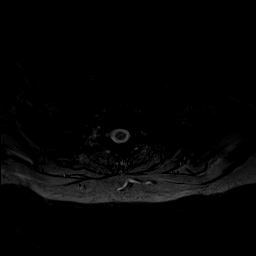
[im 5/26]
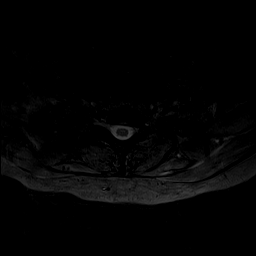
[im 9/26]
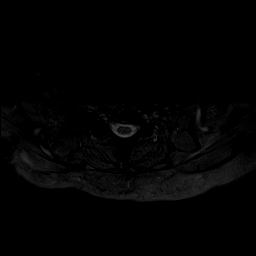
[im 11/26]
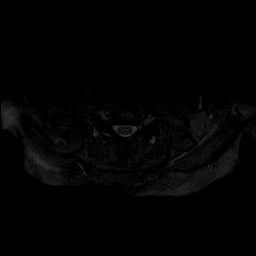
[im 15/26]
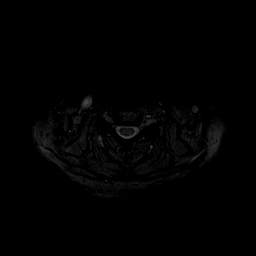
[im 17/26]
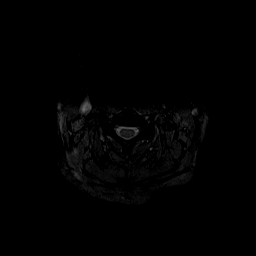
[im 21/26]
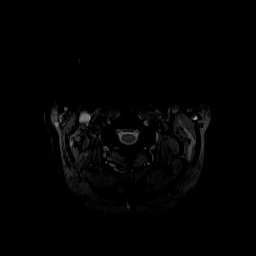
[im 26/26]
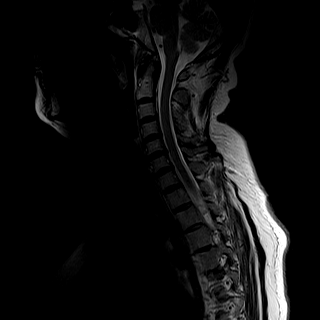

[41 of 48 positions shown; findings below may reference images not displayed]

FINDINGS: There is no evidence of disc protrusion or spinal stenosis. The
cervical cord is normal. The craniovertebral junction is normal.
IMPRESSION: No acute or focal abnormality identified. There is no
evidence of disc protrusion or spinal stenosis. No evidence of neural
foraminal narrowing.
IMPRESSION:

## 2010-02-05 IMAGING — CR DG CHEST 1V PORT
1 series · 1 of 1 positions shown · non-contrast
Comparison: none

REASON FOR EXAM: Chest Pain
COMMENTS:

[view not recorded]
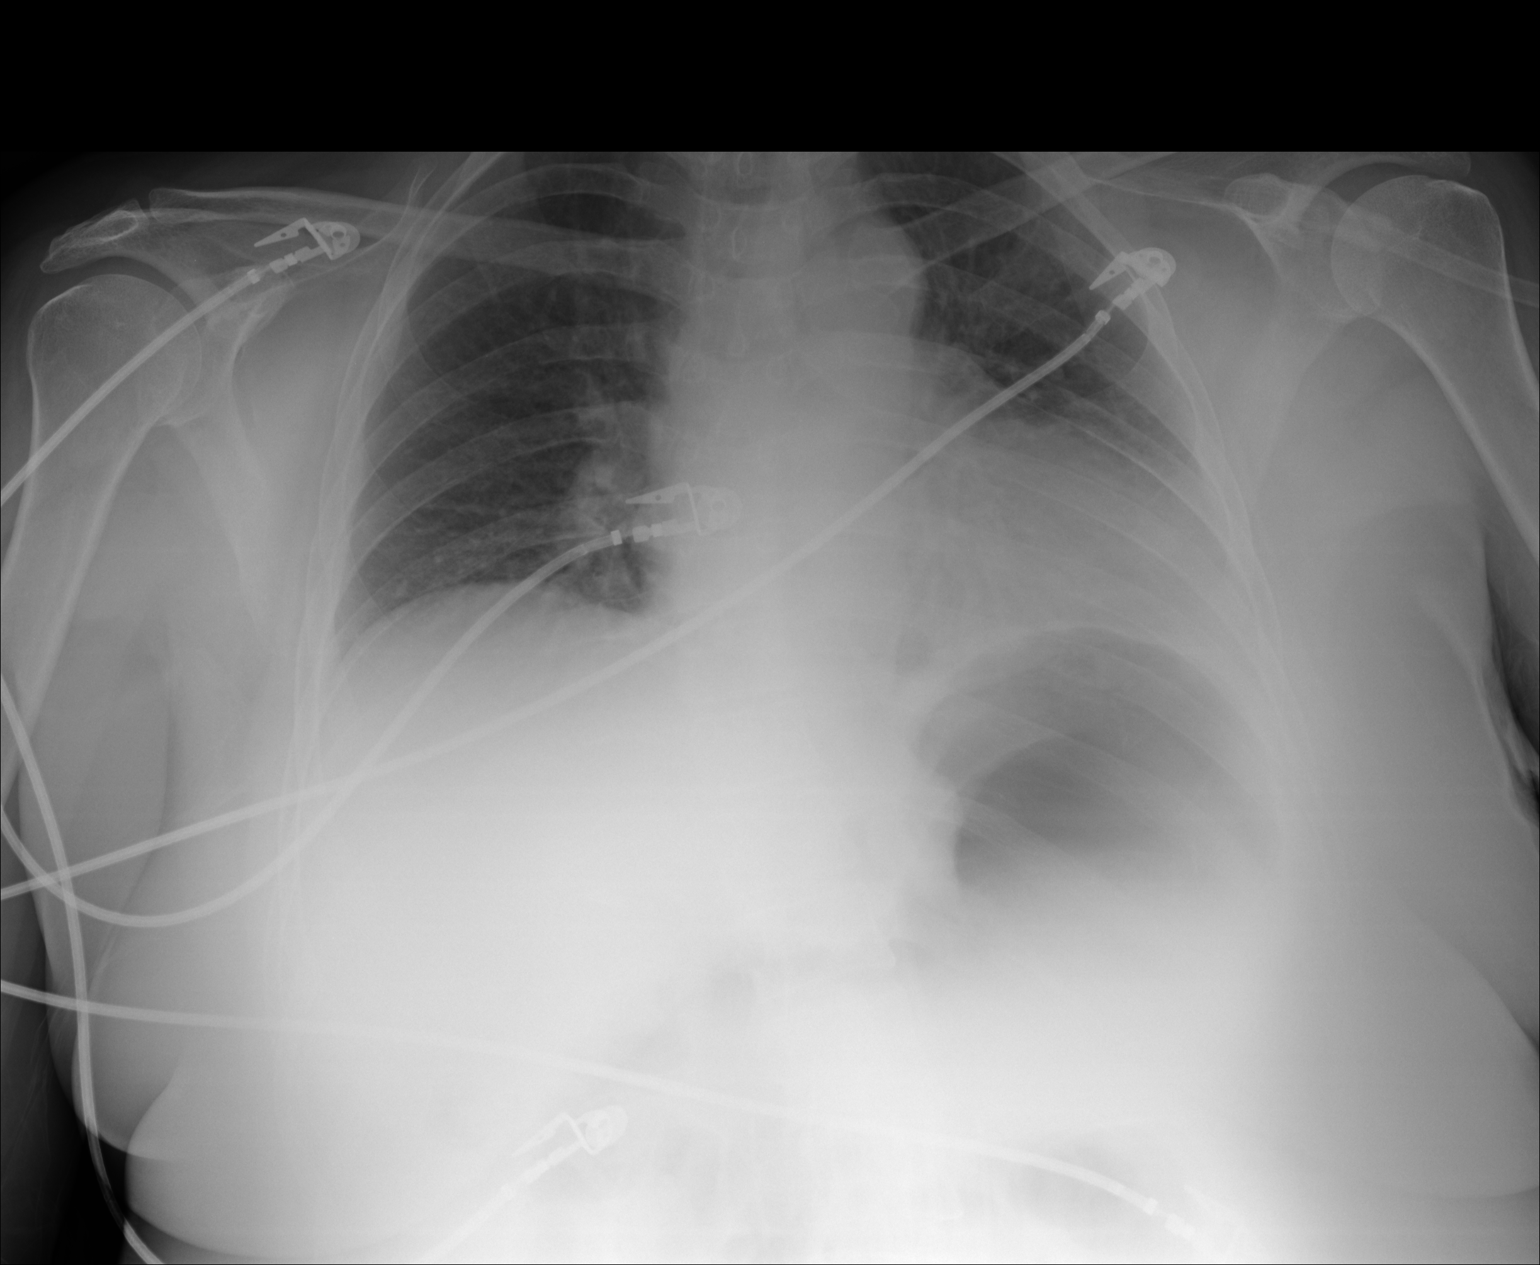

[1 of 1 positions shown; findings below may reference images not displayed]

PROCEDURE:     DXR - DXR PORTABLE CHEST SINGLE VIEW  - March 14, 2008  [DATE]

RESULT:     The LEFT hemidiaphragm is indistinct. The possibility of
atelectasis or early infiltrate at the LEFT base cannot be excluded on the
current exam. Follow-up examination including a lateral view is recommended
if symptomatology persists. The RIGHT lung field is clear. The heart is
upper limits to normal in size or slightly enlarged. There is no
interstitial or pulmonary edema. There is a mildly prominent gastric air
bubble. Monitoring electrodes are present.
IMPRESSION: 1.     There is haziness at the LEFT base. The possibility of atelectasis or
early infiltrate cannot be excluded. Follow-up examination with lateral view
is recommended if symptomatology persists.

## 2010-02-07 ENCOUNTER — Inpatient Hospital Stay: Payer: Self-pay | Admitting: Internal Medicine

## 2010-02-15 ENCOUNTER — Ambulatory Visit: Payer: Self-pay | Admitting: Family Medicine

## 2010-03-08 ENCOUNTER — Observation Stay: Payer: Self-pay | Admitting: Internal Medicine

## 2010-04-11 ENCOUNTER — Ambulatory Visit: Payer: Self-pay | Admitting: Gastroenterology

## 2010-04-14 ENCOUNTER — Inpatient Hospital Stay: Payer: Self-pay | Admitting: Internal Medicine

## 2010-04-19 ENCOUNTER — Observation Stay: Payer: Self-pay | Admitting: Internal Medicine

## 2010-05-06 ENCOUNTER — Emergency Department: Payer: Self-pay | Admitting: Emergency Medicine

## 2010-09-06 ENCOUNTER — Inpatient Hospital Stay: Payer: Self-pay | Admitting: Internal Medicine

## 2010-09-13 ENCOUNTER — Inpatient Hospital Stay: Payer: Self-pay | Admitting: Internal Medicine

## 2010-09-18 ENCOUNTER — Inpatient Hospital Stay: Payer: Self-pay | Admitting: Internal Medicine

## 2010-09-28 ENCOUNTER — Inpatient Hospital Stay: Payer: Self-pay | Admitting: Specialist

## 2010-09-30 ENCOUNTER — Encounter: Payer: Self-pay | Admitting: Family Medicine

## 2010-10-01 ENCOUNTER — Encounter: Payer: Self-pay | Admitting: Physical Medicine & Rehabilitation

## 2010-10-10 NOTE — Letter (Signed)
Summary: Dr.Francisco [facility],Note  Dr.Francisco [facility],Note   Imported By: Virgia Land 10/28/2009 13:17:23  _____________________________________________________________________  External Attachment:    Type:   Image     Comment:   External Document

## 2010-10-19 IMAGING — CR DG CHEST 2V
1 series · 3 of 3 positions shown · non-contrast
Comparison: none

REASON FOR EXAM: cough
COMMENTS:

[Series 1: view not recorded · 0.17mm/px · 3 of 3 slices shown]
[im 1/3]
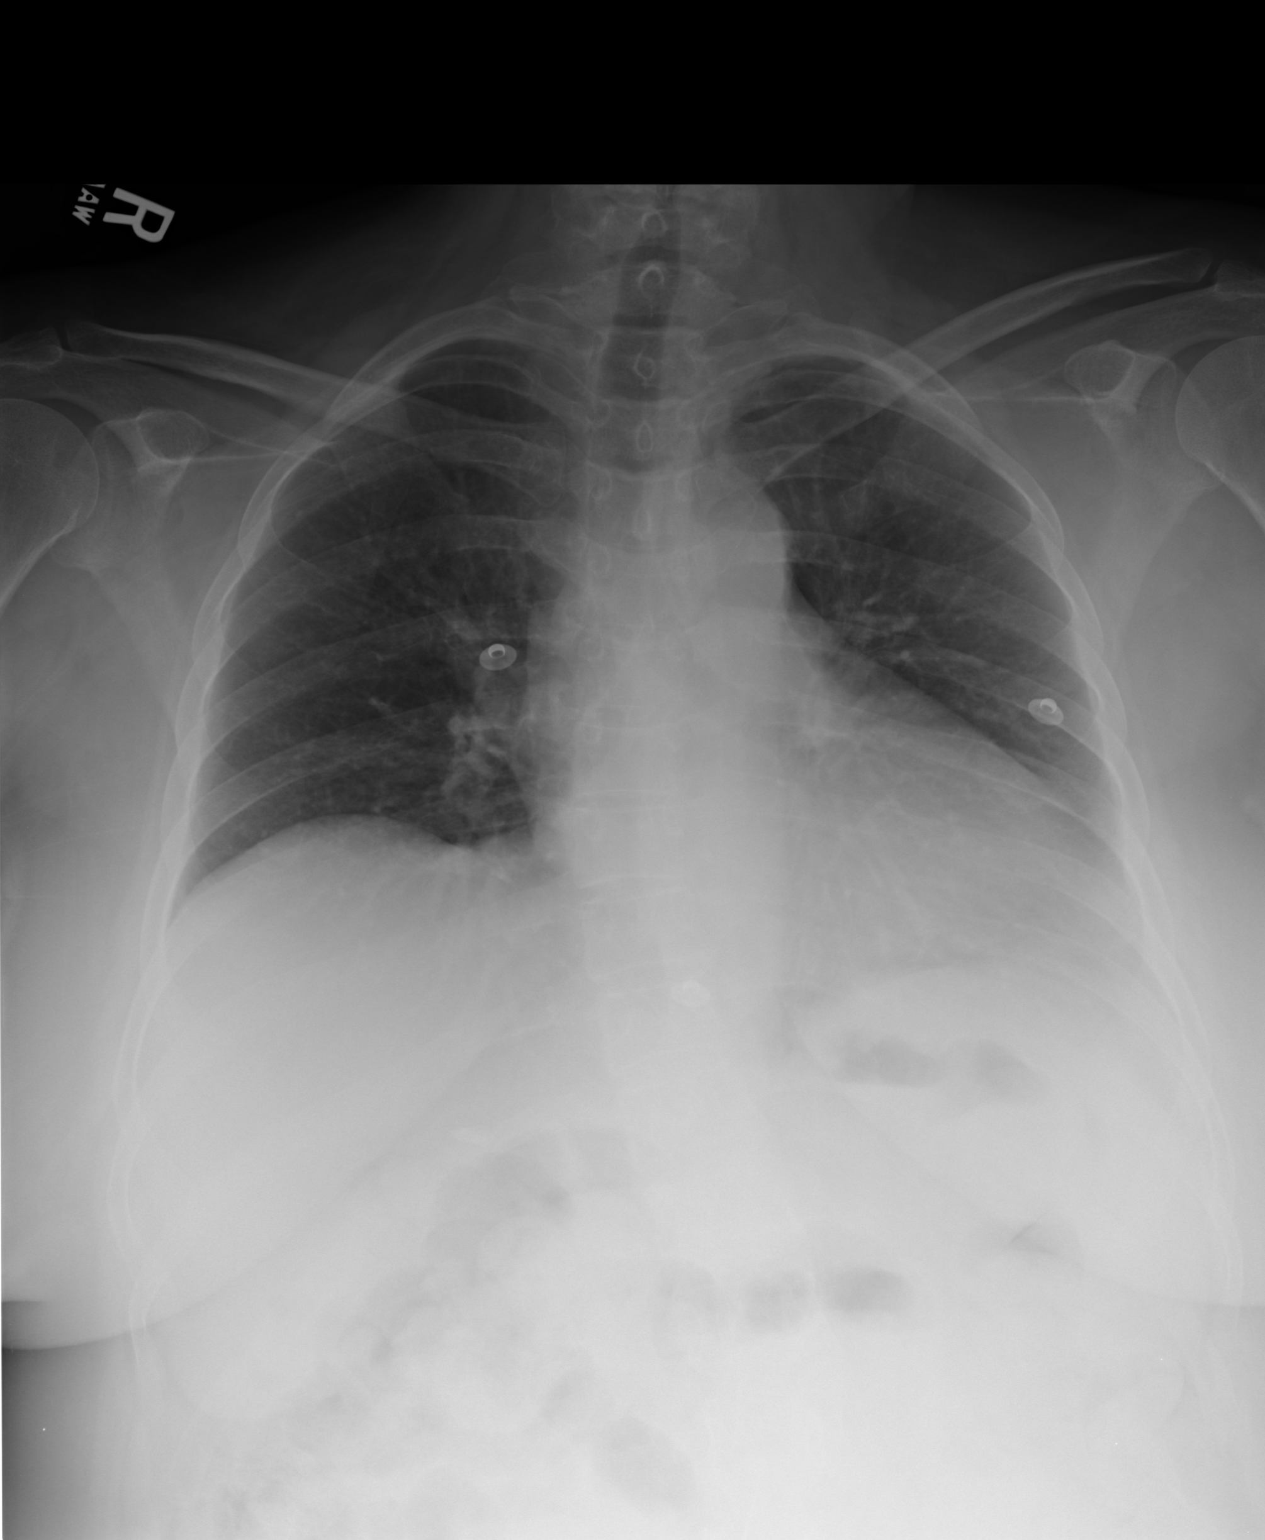
[im 2/3]
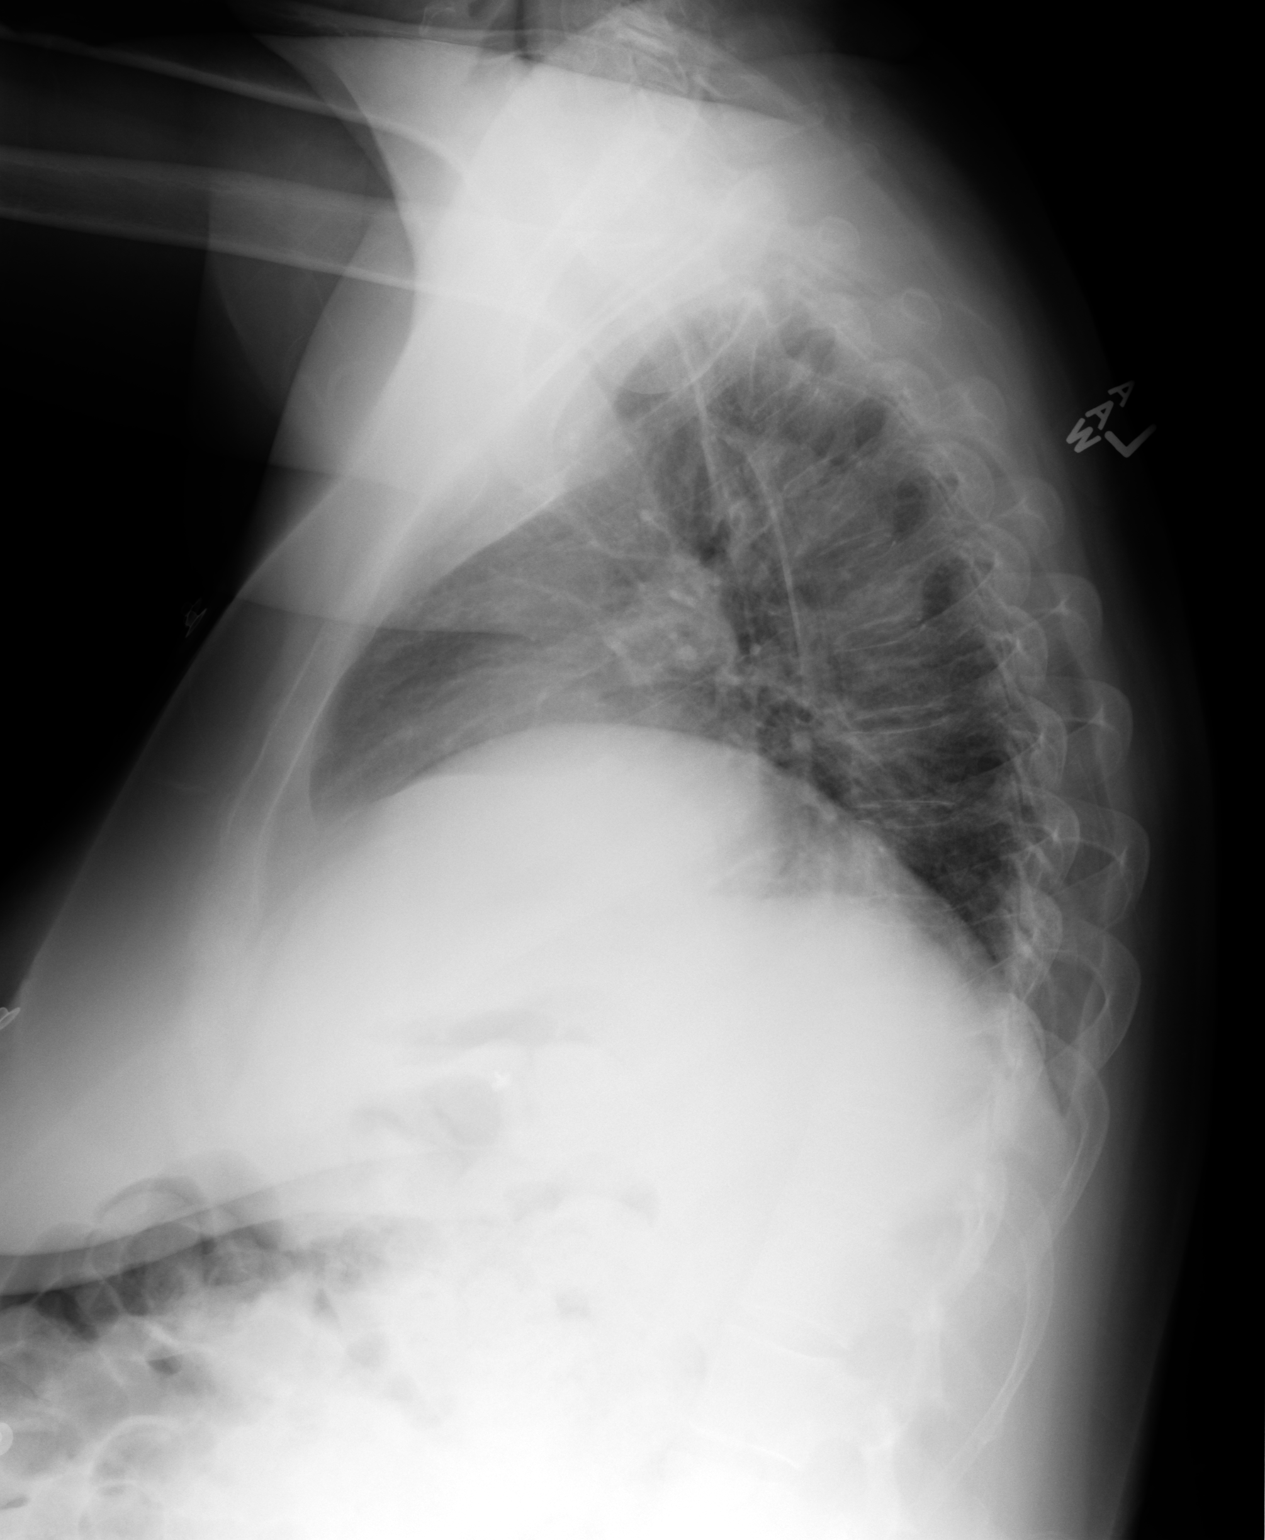
[im 3/3]
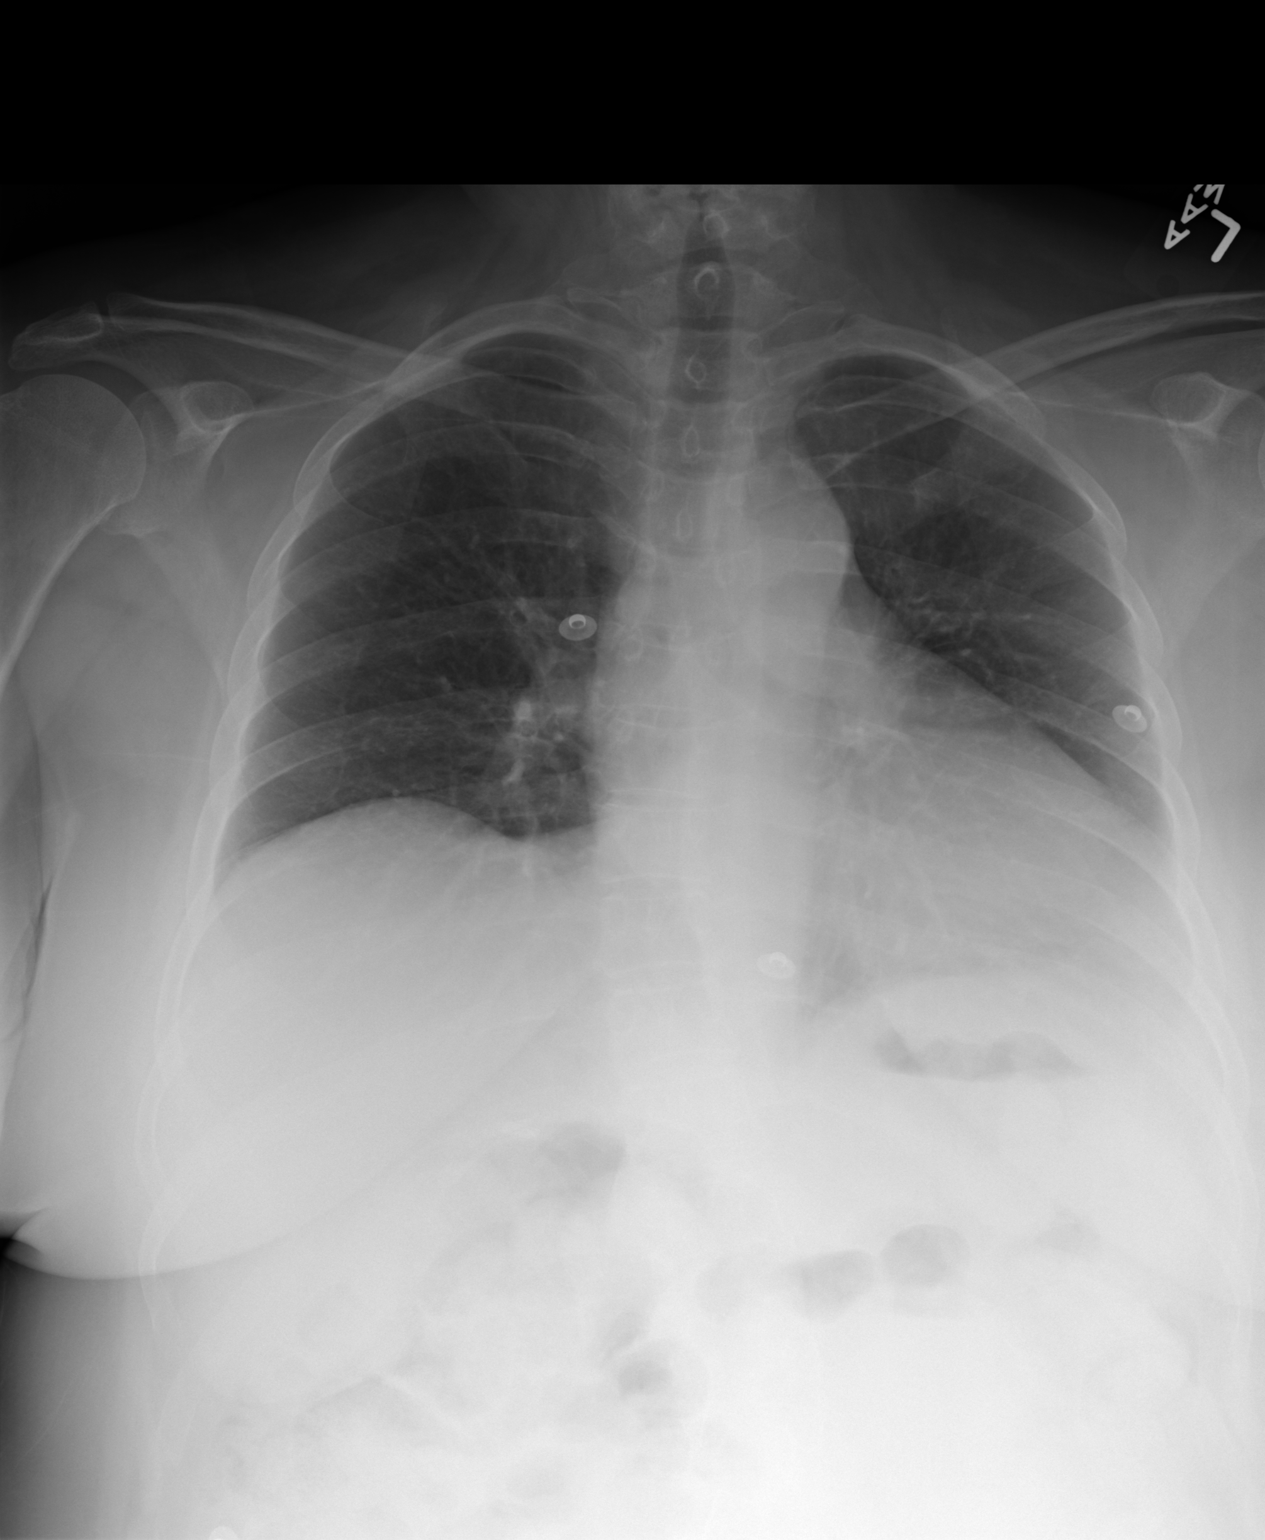

[3 of 3 positions shown; findings below may reference images not displayed]

PROCEDURE:     DXR - DXR CHEST PA (OR AP) AND LATERAL  - November 25, 2008  [DATE]

RESULT:     Comparison is made to a prior study dated 03/14/08.

The patient has taken a shallow inspiration.  There is elevation of the
RIGHT hemidiaphragm.  The cardiac silhouette is moderately enlarged. The
visualized bony skeleton demonstrates no fracture or dislocation. There is
no evidence of focal regions of consolidation or focal infiltrates.  This
study was compared to a previous study dated 03/14/08.  Note, there is
S-shaped scoliosis of the thoracolumbar spine.
IMPRESSION: Shallow inspiration without evidence of acute cardiopulmonary disease.

## 2010-11-07 IMAGING — CR DG CHEST 2V
1 series · 2 of 2 positions shown · non-contrast
Comparison: none

REASON FOR EXAM: weakness fever, chills
COMMENTS:   LMP: Post Hysterectomy

[Series 1: view not recorded · 0.17mm/px · 2 of 2 slices shown]
[im 1/2]
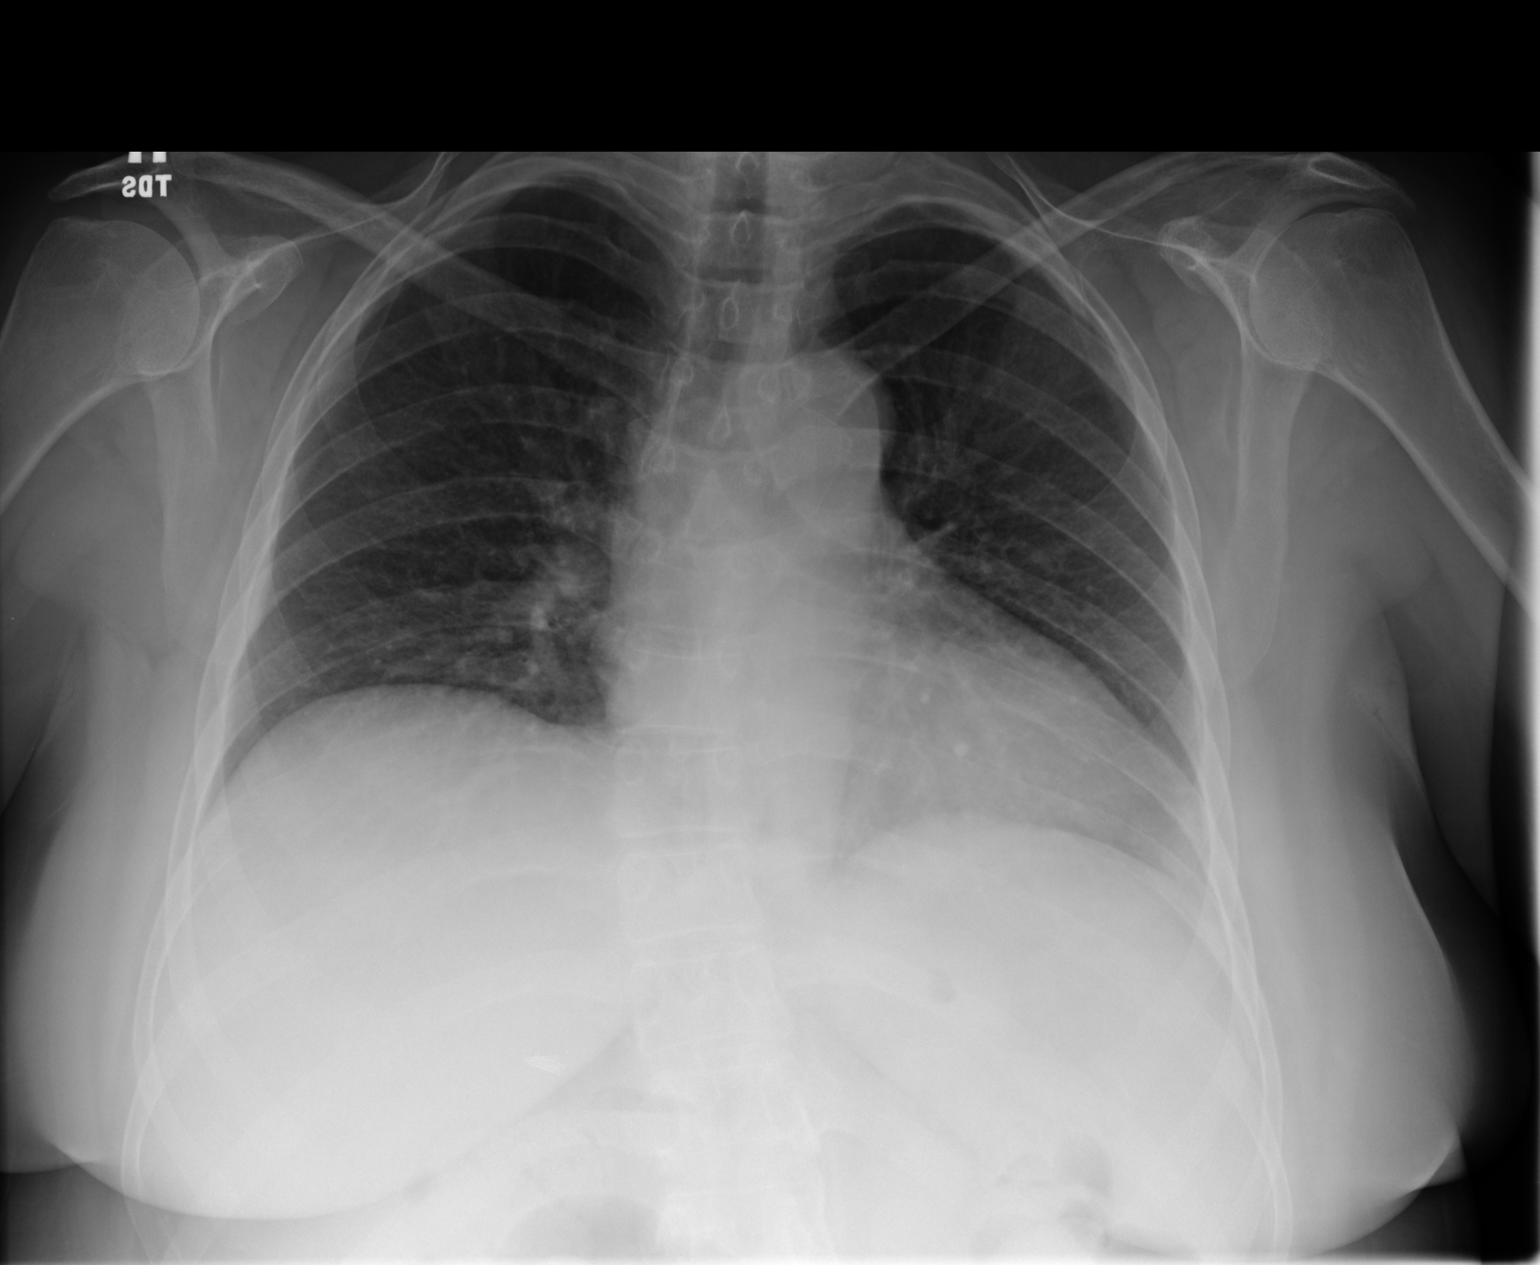
[im 2/2]
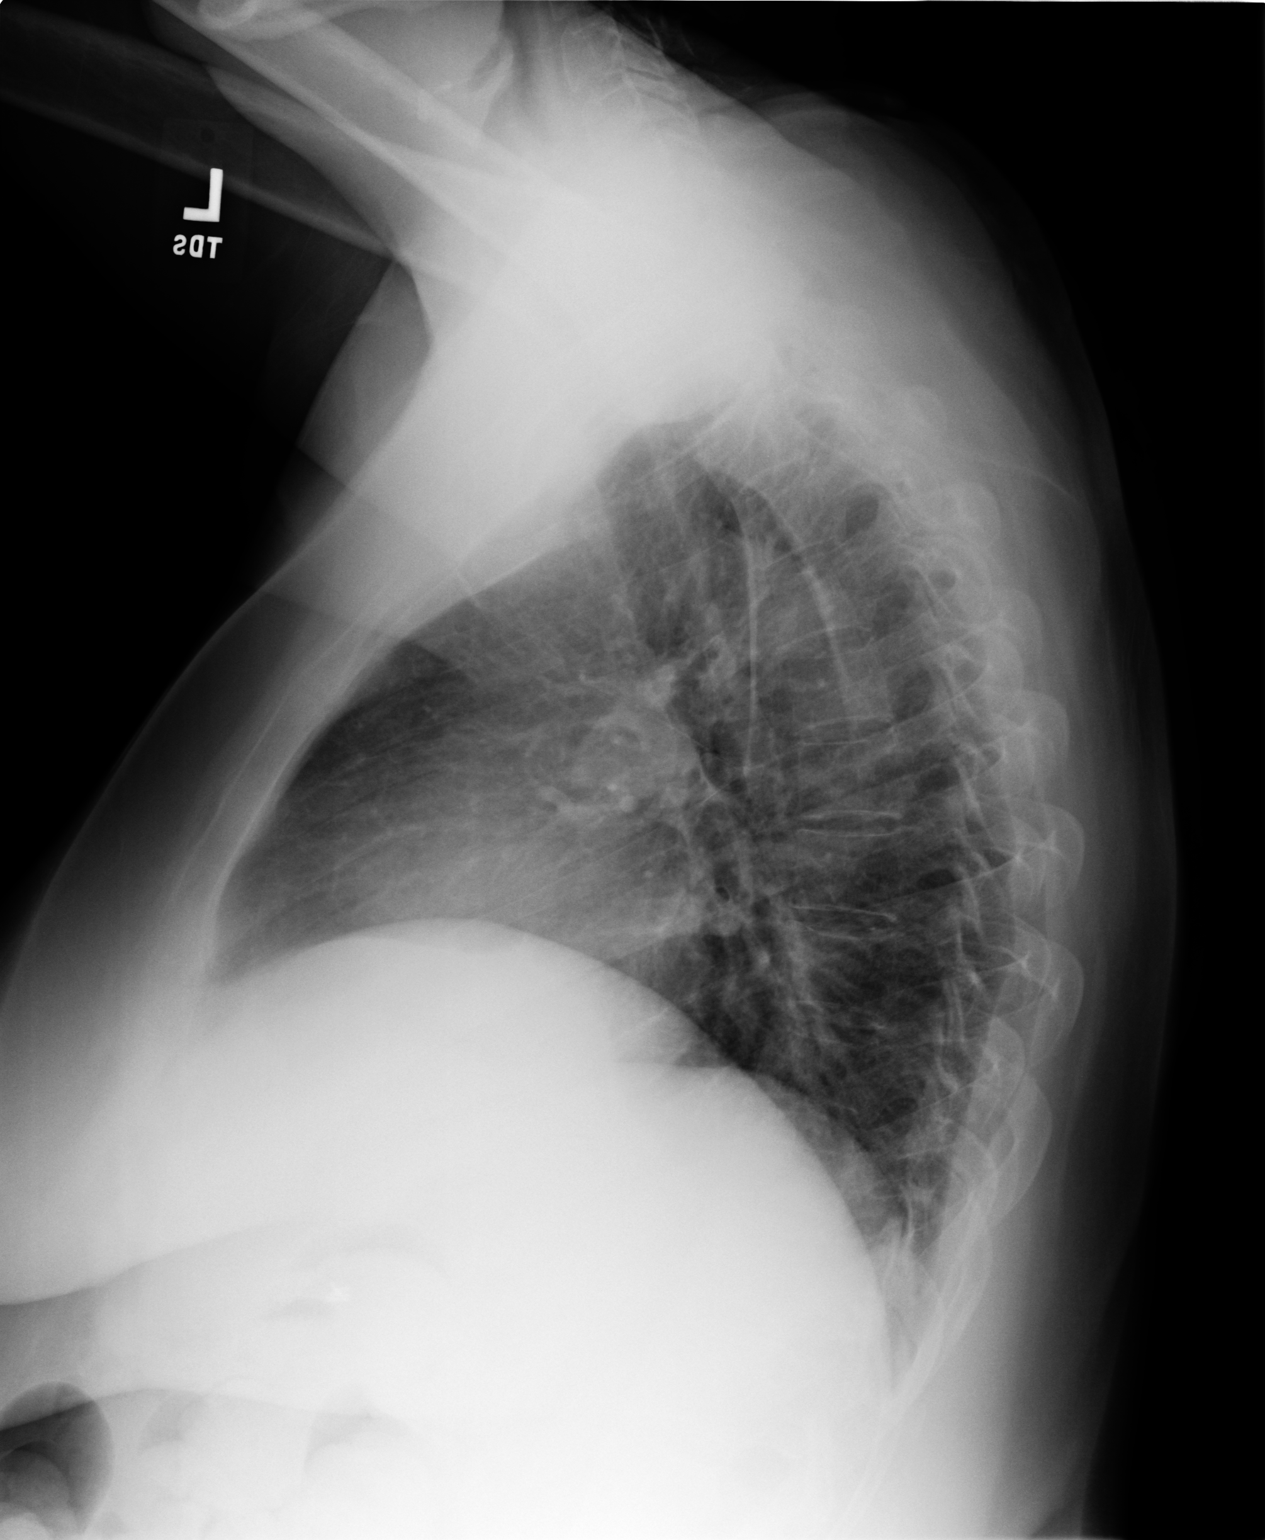

[2 of 2 positions shown; findings below may reference images not displayed]

PROCEDURE:     DXR - DXR CHEST PA (OR AP) AND LATERAL  - December 14, 2008  [DATE]

RESULT:     Comparison is made to the prior exam of 11/25/2008. The lung
fields are clear. The heart is upper limits fir normal in size or slightly
enlarged but stable as compared to the prior exam. The mediastinal
structures are normal in appearance. There is slight thoracic scoliosis with
a convexity to the right.
IMPRESSION: 1. The lung fields are clear.
2. The heart is upper limits for normal in size or mildly enlarged.
3. Mild thoracic scoliosis.

## 2010-11-25 IMAGING — CR DG CHEST 2V
1 series · 2 of 2 positions shown · non-contrast
Comparison: none

REASON FOR EXAM: infection?
COMMENTS:

[Series 1: view not recorded · 0.17mm/px · 2 of 2 slices shown]
[im 1/2]
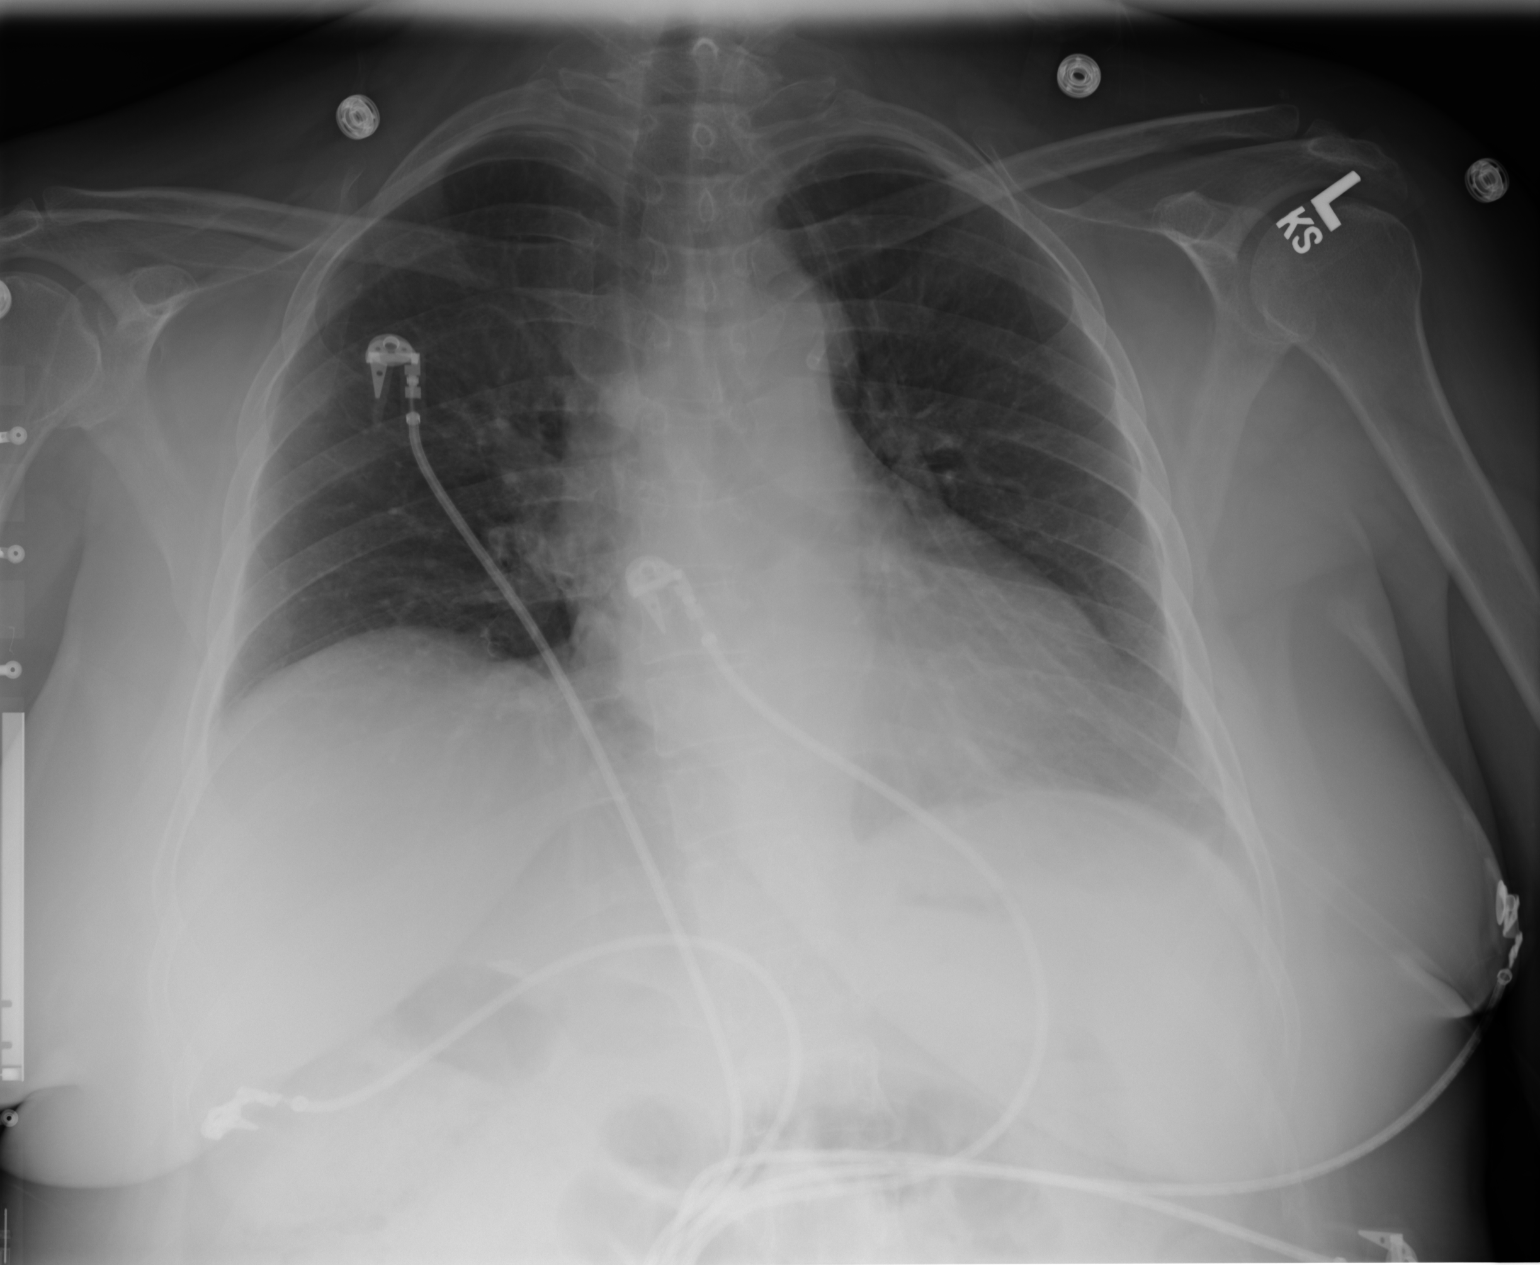
[im 2/2]
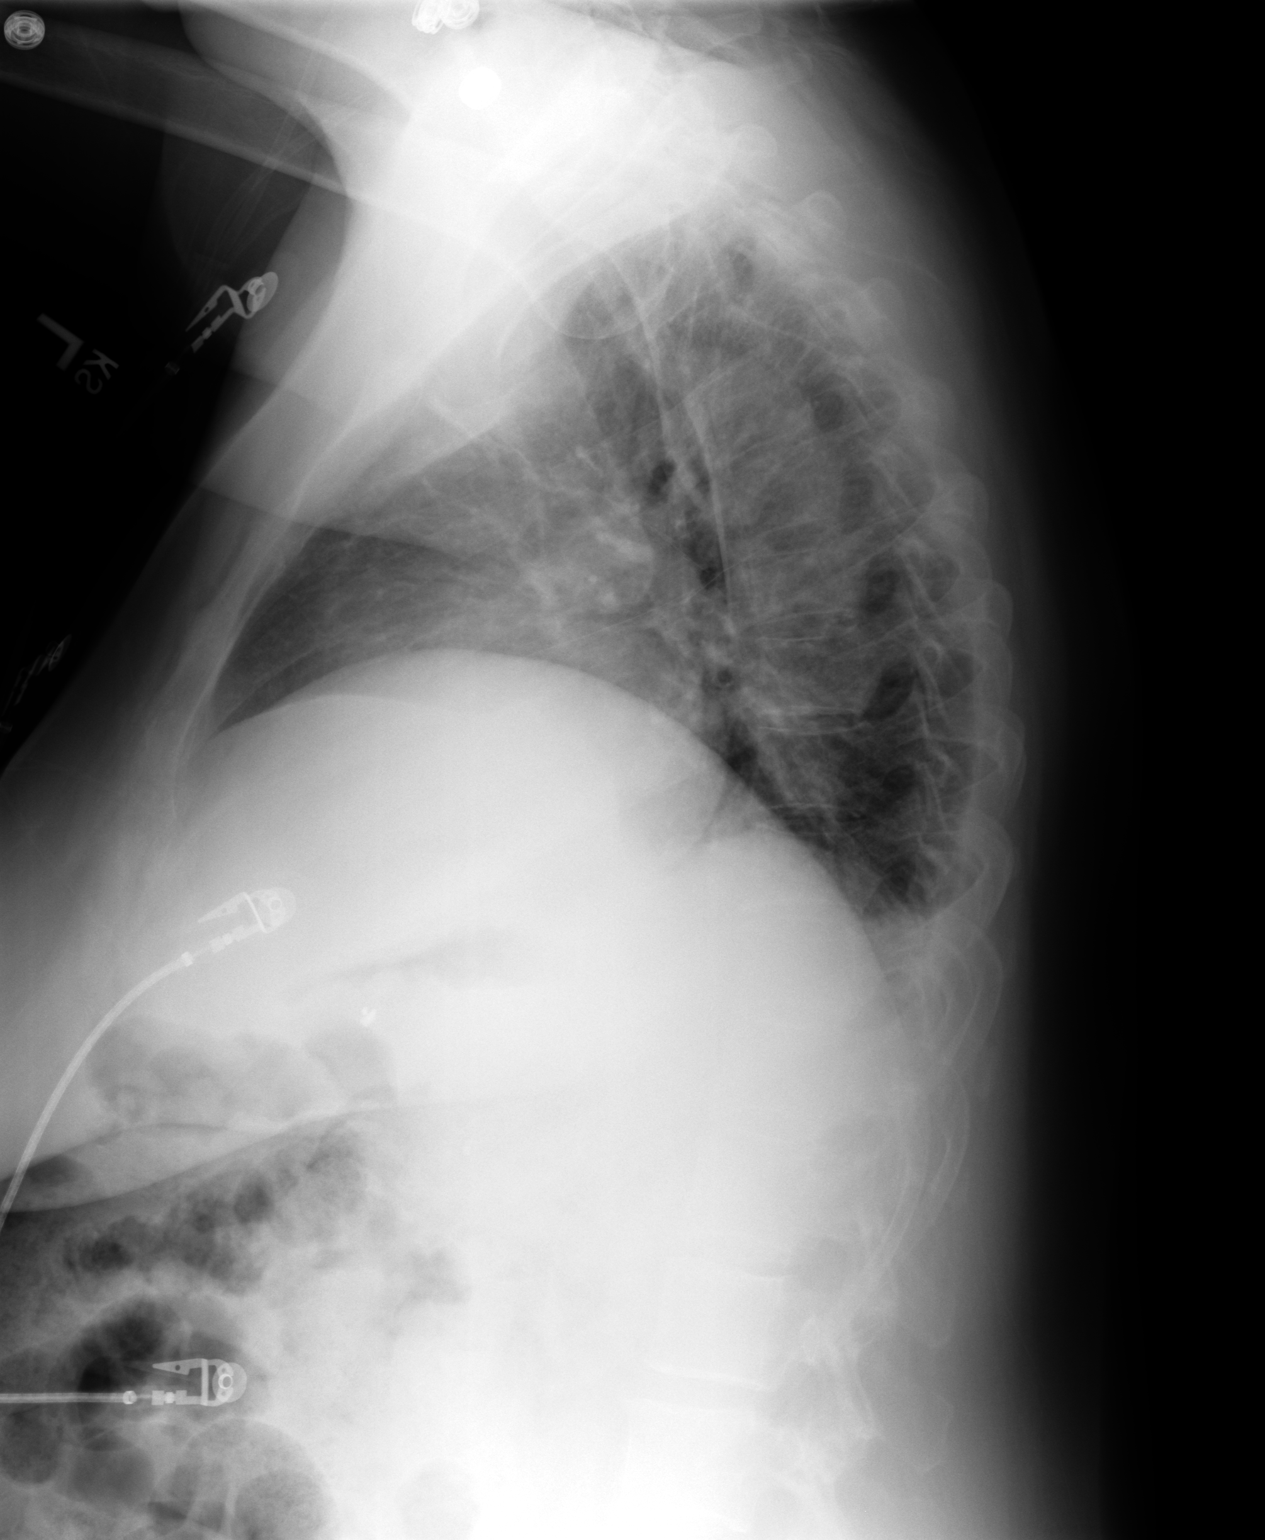

[2 of 2 positions shown; findings below may reference images not displayed]

PROCEDURE:     DXR - DXR CHEST PA (OR AP) AND LATERAL  - January 01, 2009  [DATE]

RESULT:     Comparison is made to prior study dated 12-14-08.

There is elevation of the RIGHT hemidiaphragm. No focal regions of
consolidation or focal infiltrates are appreciated. The cardiac silhouette
is within normal limits. The visualized bony skeleton is unremarkable.
IMPRESSION: 1. Elevated RIGHT hemidiaphragm without evidence of acute cardiopulmonary
disease.

## 2010-11-25 IMAGING — CT CT HEAD WITHOUT CONTRAST
2 series · 16 of 30 positions shown, 20 images · non-contrast
Comparison: none

REASON FOR EXAM: confusion
COMMENTS:

[Series 2: without · axial · non-contrast · 0.42mm/px · z∈[+1238,+1368]mm · 13 of 32 slices shown, 17 images]
[im 3/32  brain]
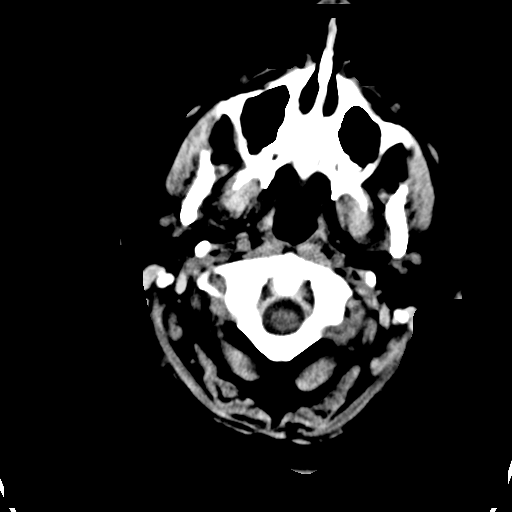
[im 3/32  bone]
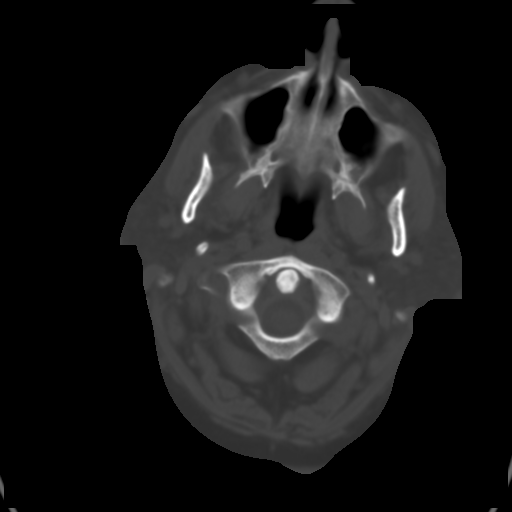
[im 5/32  brain]
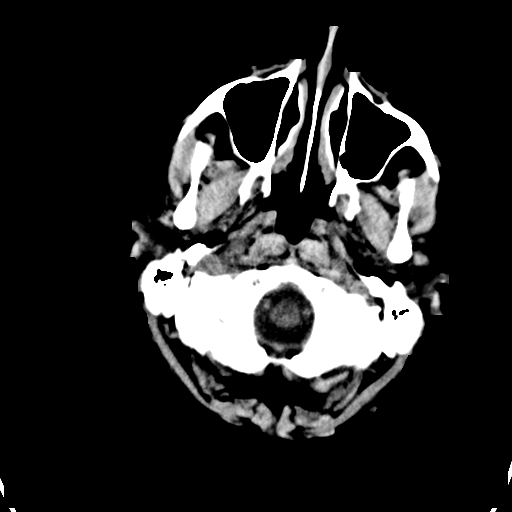
[im 7/32  brain]
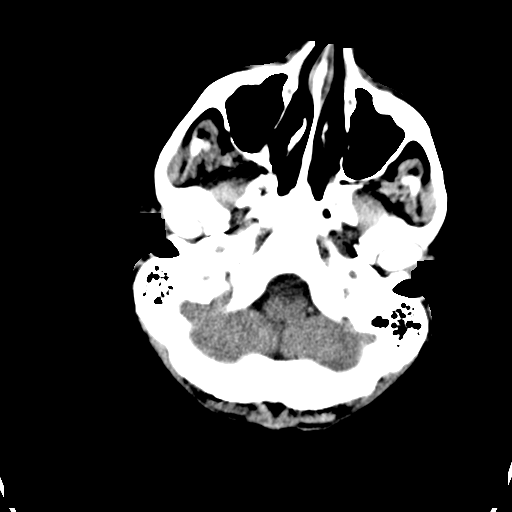
[im 9/32  brain]
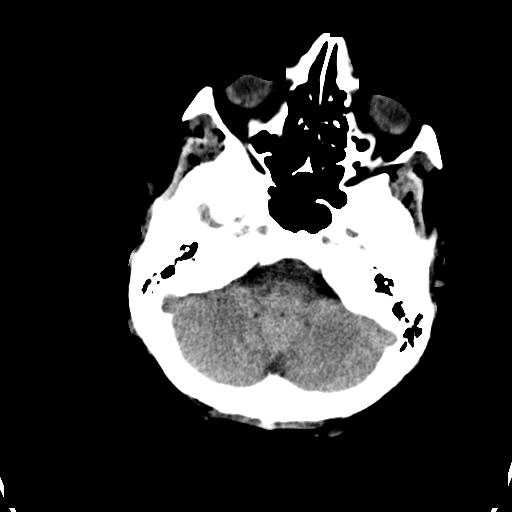
[im 12/32  brain]
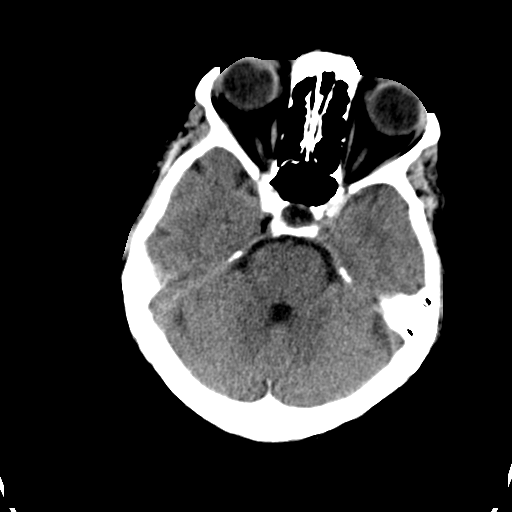
[im 12/32  bone]
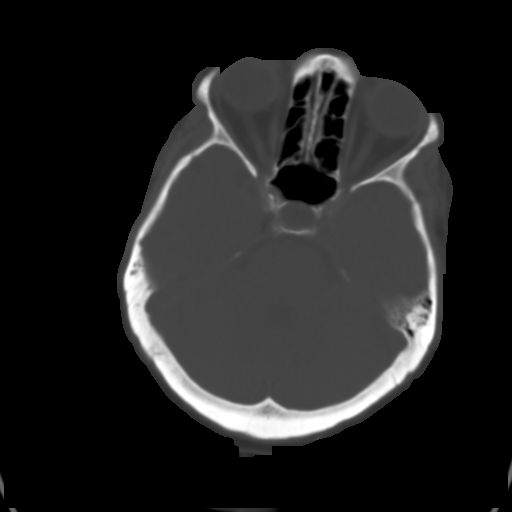
[im 14/32  brain]
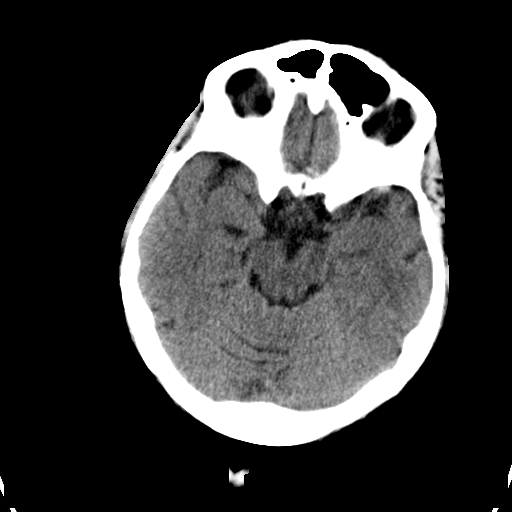
[im 16/32  brain]
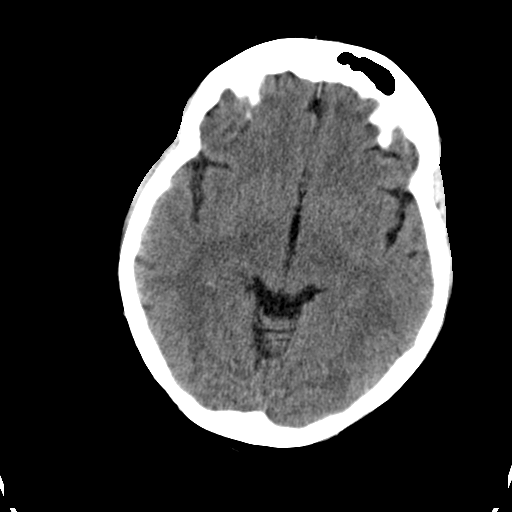
[im 18/32  brain]
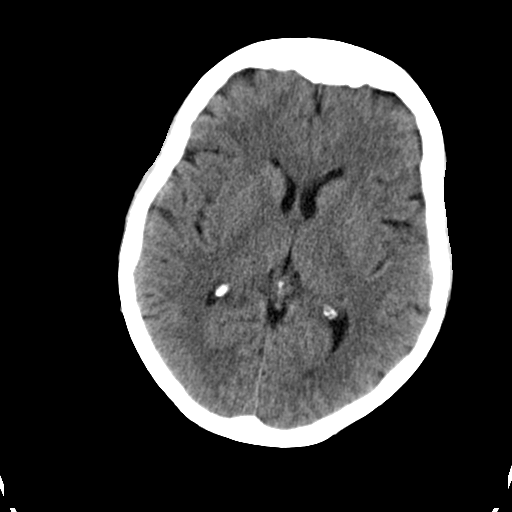
[im 20/32  brain]
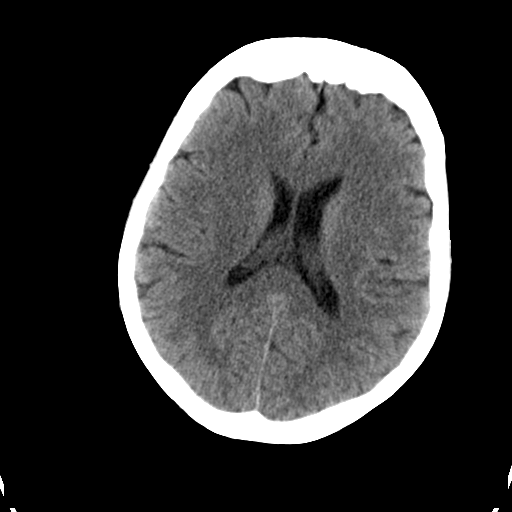
[im 20/32  bone]
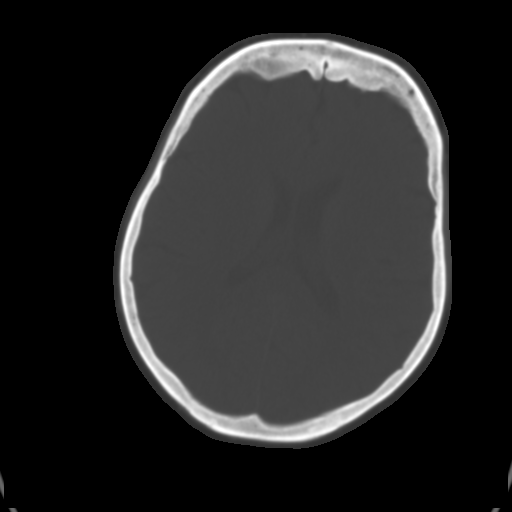
[im 23/32  brain]
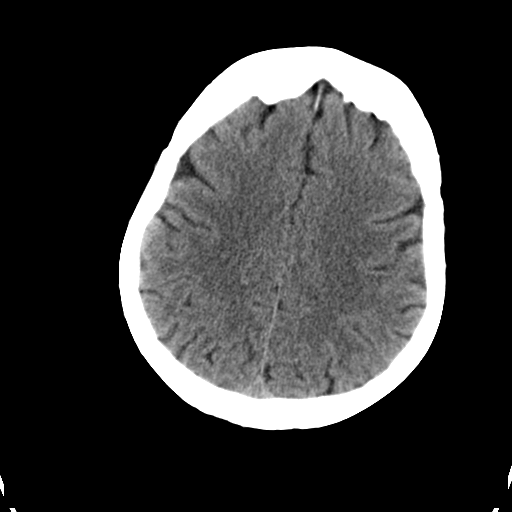
[im 25/32  brain]
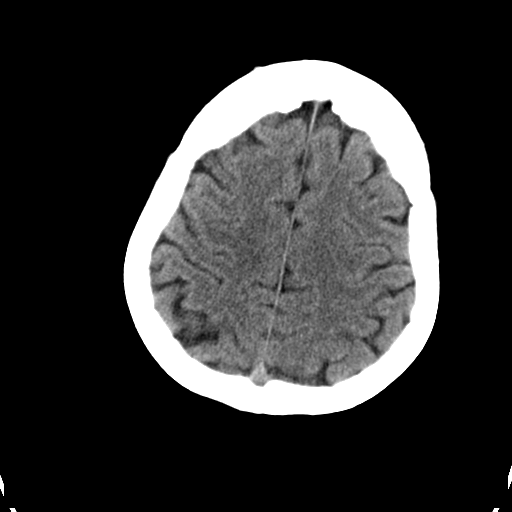
[im 27/32  brain]
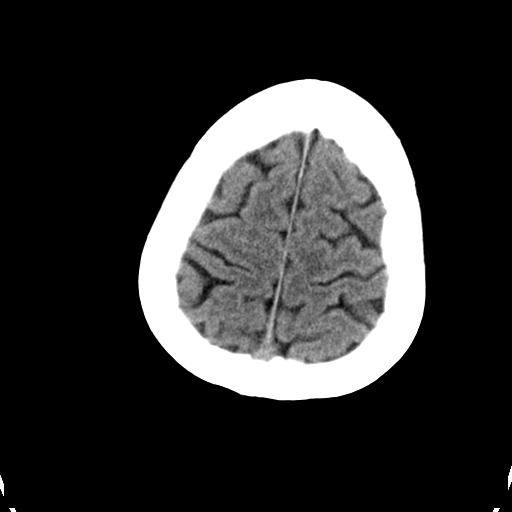
[im 29/32  brain]
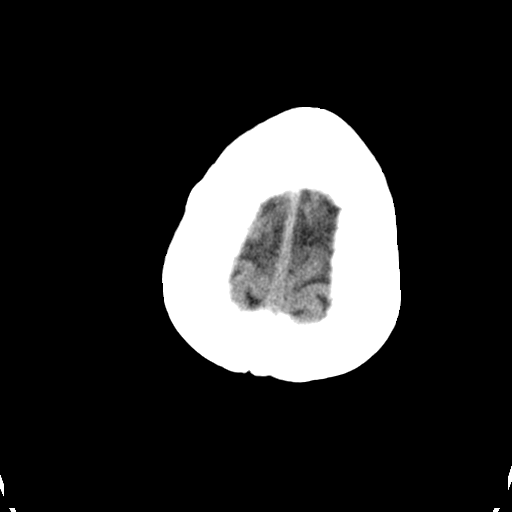
[im 29/32  bone]
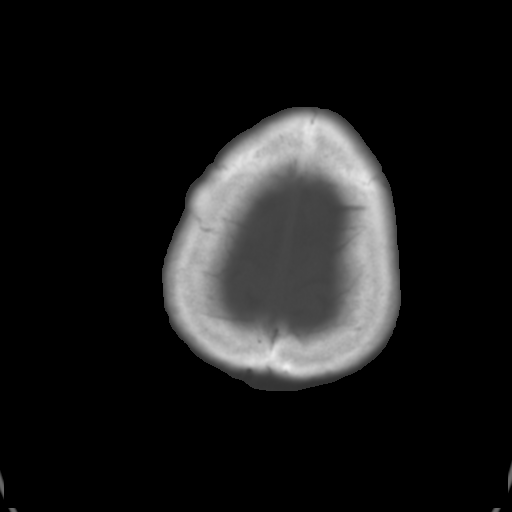

[Series 3: bone · axial · 0.42mm/px · z∈[+1238,+1284]mm · 3 of 32 slices shown]
[im 3/32  bone]
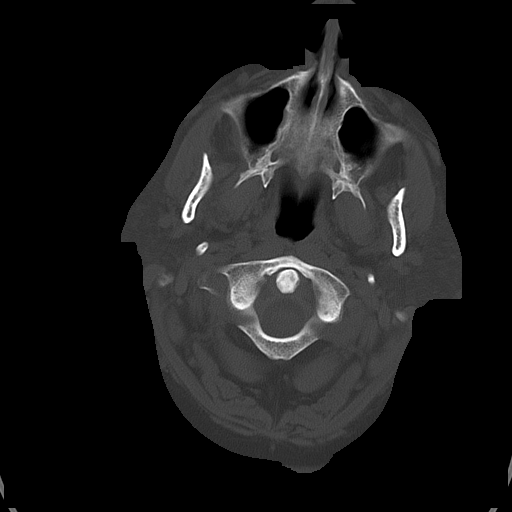
[im 7/32  bone]
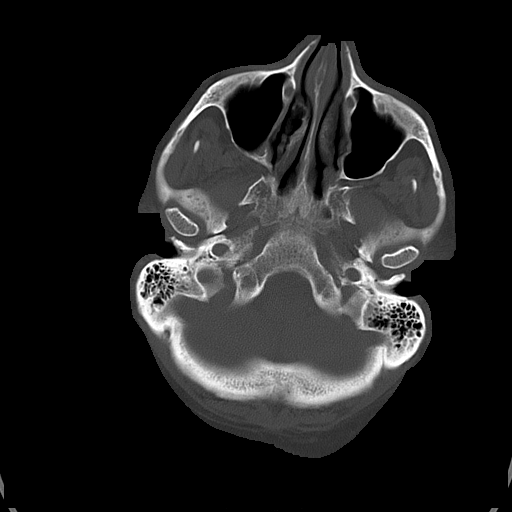
[im 12/32  bone]
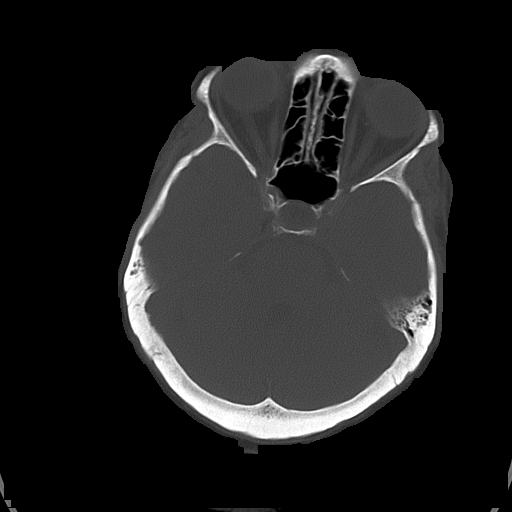

[16 of 30 positions shown; findings below may reference images not displayed]

PROCEDURE:     CT  - CT HEAD WITHOUT CONTRAST  - January 01, 2009 [DATE]

RESULT:     There is no evidence of intra-axial or extra-axial fluid
collections nor evidence of acute hemorrhage. No secondary signs are
appreciated to suggest mass effect, subacute or chronic focal territorial
infarction. The visualized osseous structures demonstrate no evidence of a
depressed skull fracture or dislocation.
IMPRESSION: 1. Unremarkable Head CT as described above. If there is persistent clinical
concern, further evaluation with MRI is recommended, if clinically
warranted.

## 2010-11-25 IMAGING — US US RENAL KIDNEY
1 series · 17 of 25 positions shown · non-contrast
Comparison: none

REASON FOR EXAM: acute renal failure
COMMENTS:

[Series 1: us renal kidney · 17 of 27 slices shown]
[im 1/27]
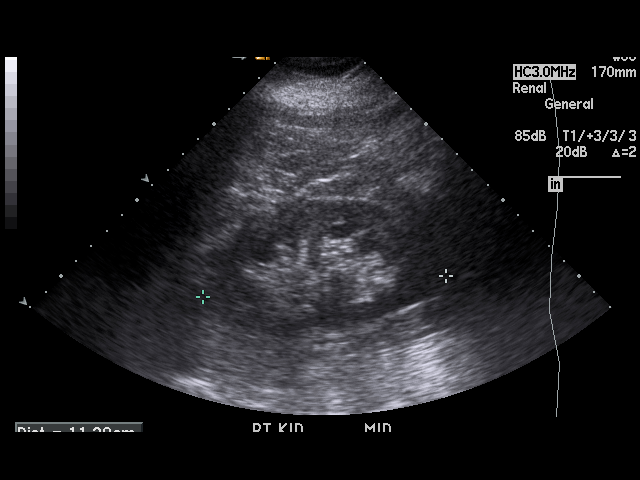
[im 3/27]
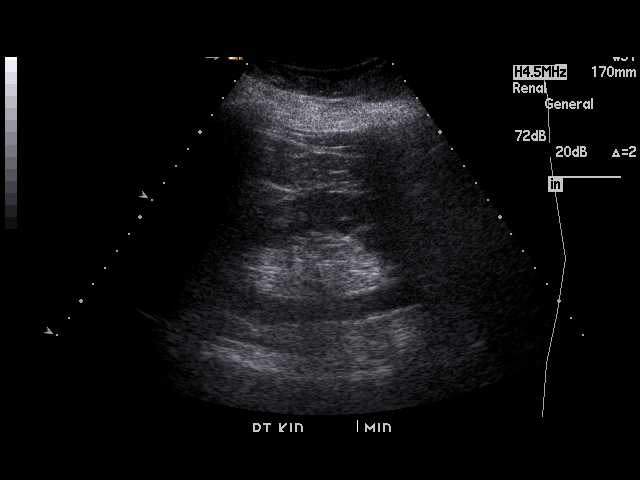
[im 4/27]
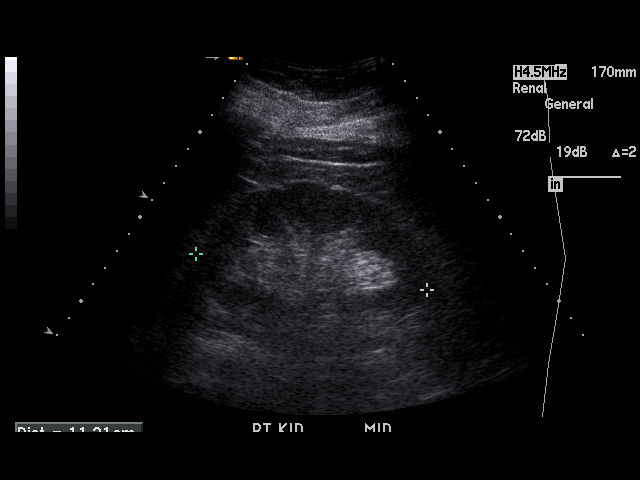
[im 6/27]
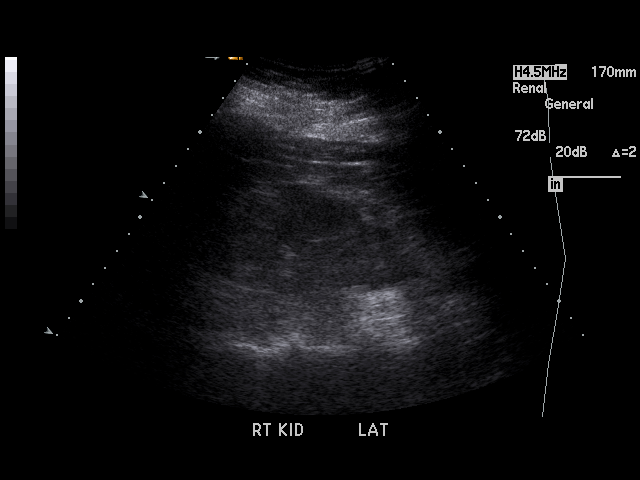
[im 7/27]
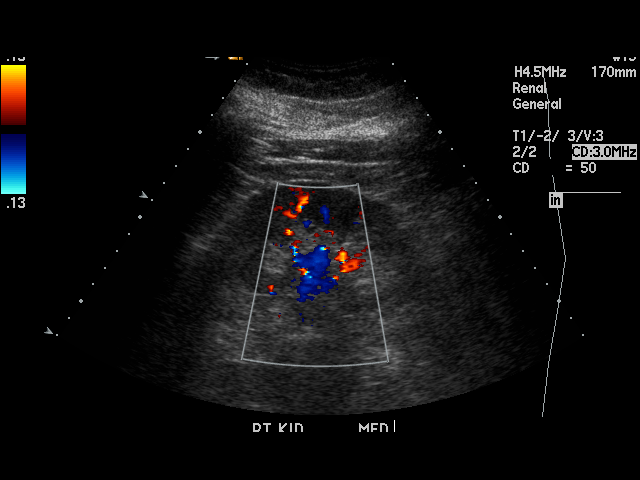
[im 9/27]
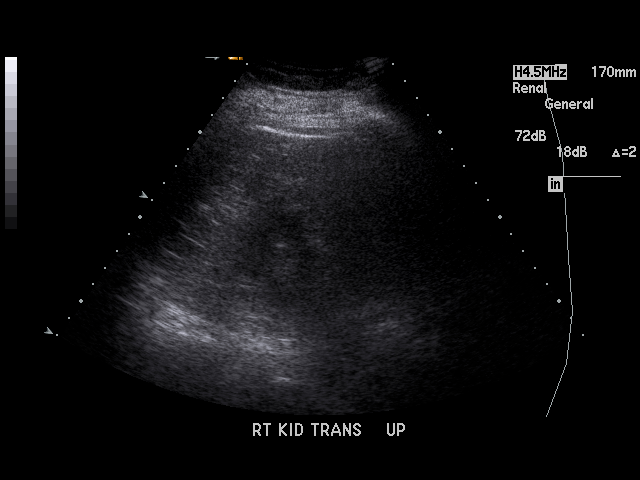
[im 10/27]
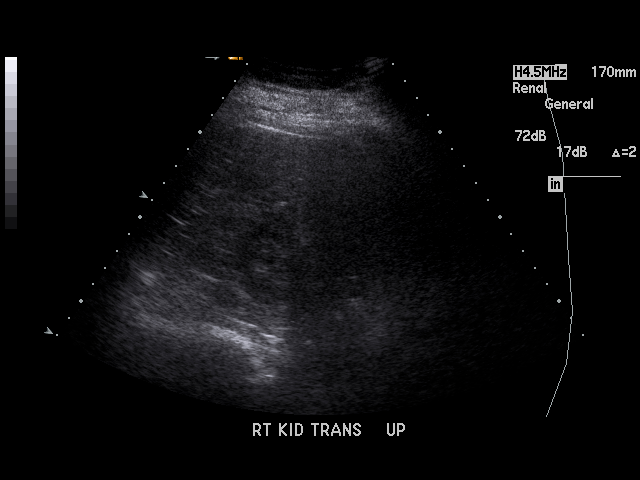
[im 12/27]
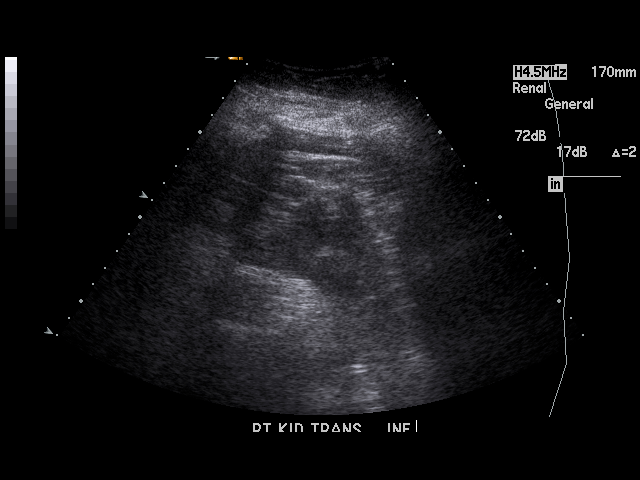
[im 14/27]
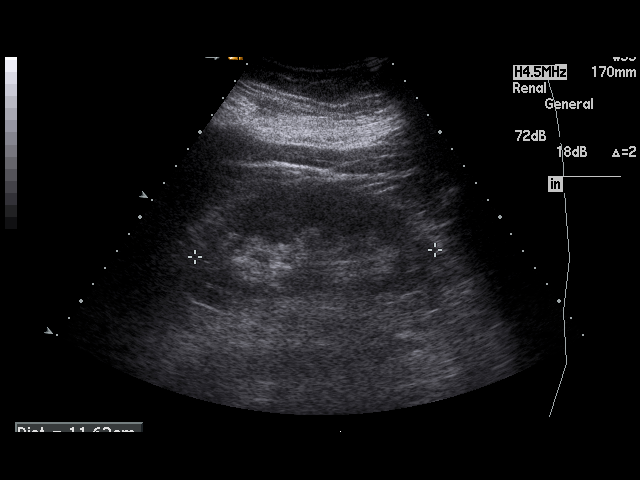
[im 15/27]
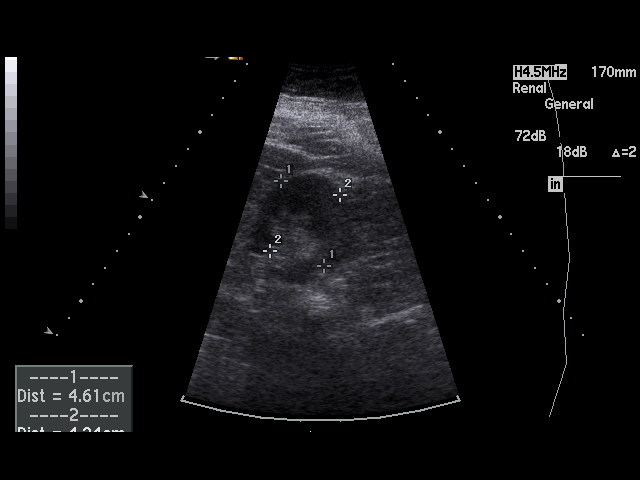
[im 17/27]
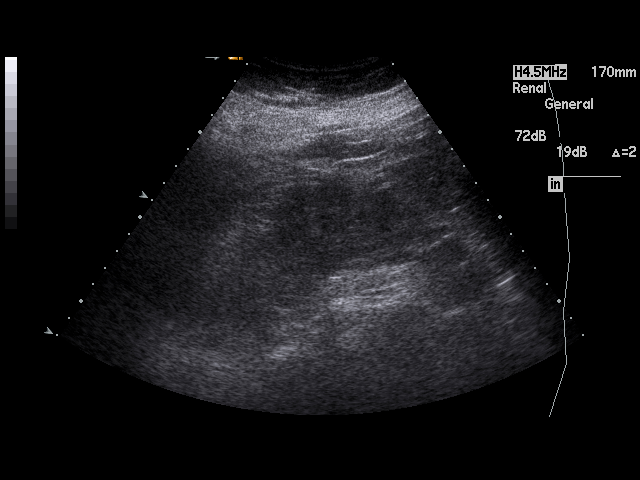
[im 18/27]
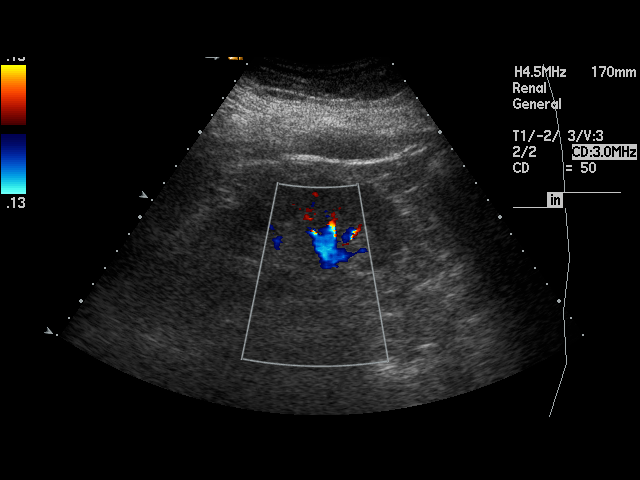
[im 20/27]
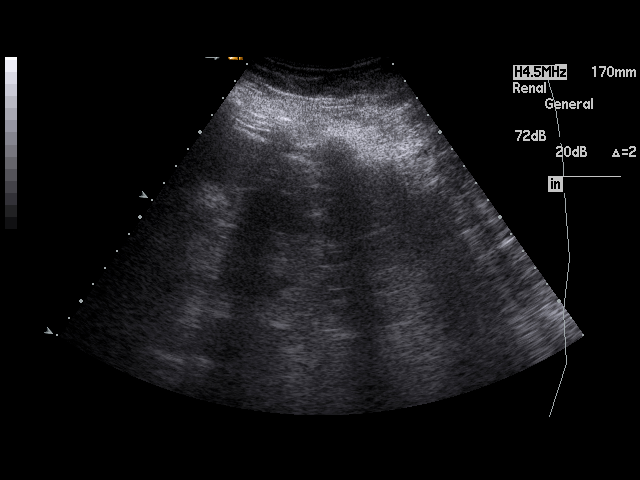
[im 21/27]
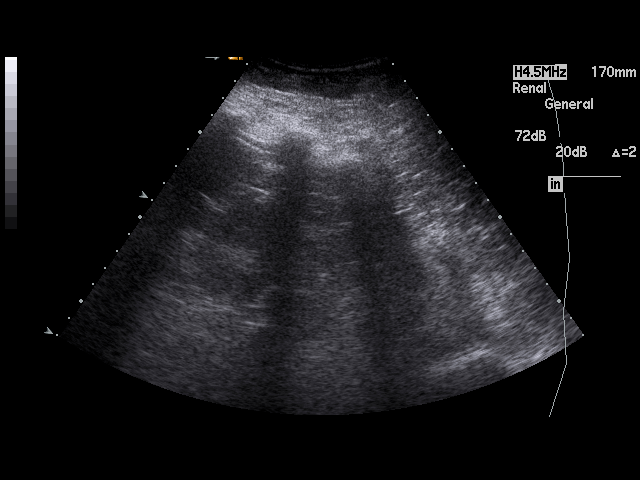
[im 23/27]
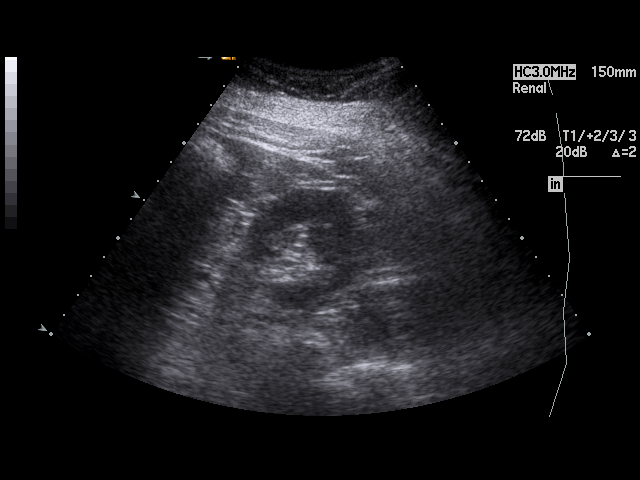
[im 24/27]
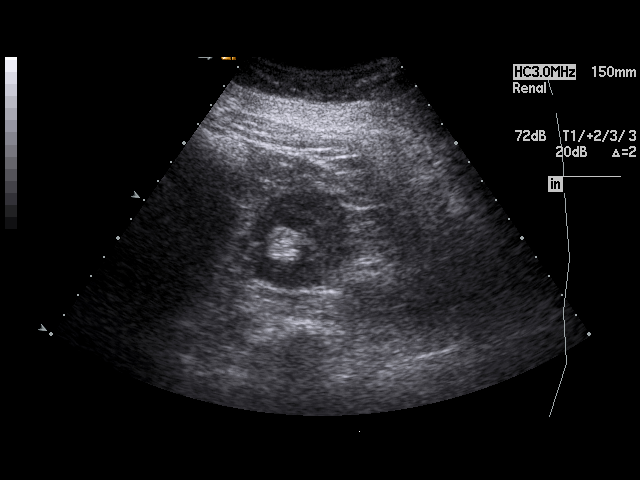
[im 27/27]
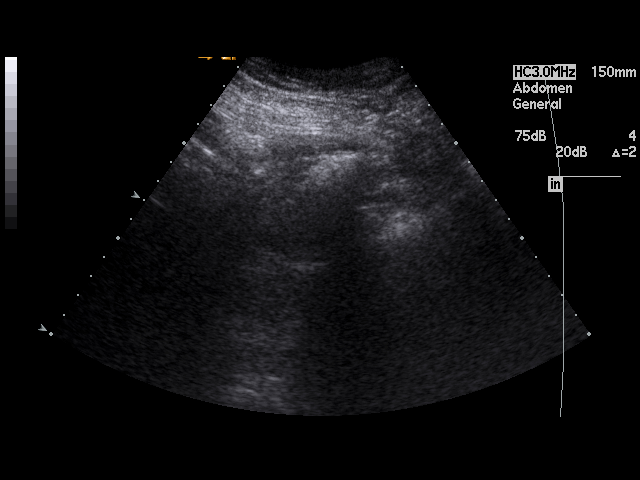

[17 of 25 positions shown; findings below may reference images not displayed]

PROCEDURE:     US  - US KIDNEY  - January 01, 2009  [DATE]

RESULT:     The RIGHT kidney measures 11.31 x 5.03 x 4.78 cm and the LEFT
11.62 x 4.61 x 4.34 cm.  There is appropriate corticomedullary
differentiation and no evidence of hydronephrosis, masses or calculi.  The
urinary bladder is distended with urine.
IMPRESSION: 1. Unremarkable bilateral renal ultrasound as described above.

## 2010-12-26 ENCOUNTER — Other Ambulatory Visit: Payer: Self-pay | Admitting: Family Medicine

## 2010-12-26 DIAGNOSIS — Z1231 Encounter for screening mammogram for malignant neoplasm of breast: Secondary | ICD-10-CM

## 2011-01-02 ENCOUNTER — Ambulatory Visit: Payer: Self-pay

## 2011-01-23 NOTE — Discharge Summary (Signed)
[name], [name]               ACCOUNT NO.:  [id number]   MEDICAL RECORD NO.:  [id number]          PATIENT TYPE:  INP   LOCATION:  60                         FACILITY:  [facility]   PHYSICIAN:  Leana Gamer, MDDATE OF BIRTH:  [birth year]   DATE OF ADMISSION:  09/06/2007  DATE OF DISCHARGE:                               DISCHARGE SUMMARY   INTERIM DISCHARGE SUMMARY.   DATE OF DISCHARGE:  Yet to be determined.   FINAL DIAGNOSES:  1. Gastroenteritis.  2. Diabetes type 2.  3. Hypothyroidism.  4. Diabetic gastroparesis.  5. Chronic pain.  6. Gastroesophageal reflux disease.  7. Hypertension.  8. Diverticulosis.  9. History of esophageal web.  10.History of anxiety and depression.  11.Previous history of uterine cancer status post external beam      radiation therapy.   DISCHARGE MEDICATIONS:  To be determined at the time of discharge.   PROCEDURE:  None.   DIAGNOSTIC STUDIES:  1. Abdominal x-ray shows no obstruction of free air.  2. Ultrasound of the abdomen which shows status post cholecystectomy.  3. Echogenic liver.  4. No ascites or fluid collection.   PRIMARY CARE PHYSICIAN:  Unassigned at this time.   CODE STATUS:  Full code.   ALLERGIES:  1. CORTISONE.  2. CODEINE.  3. TETANUS TOXOID.  4. LASIX.   CHIEF COMPLAINT:  Nausea, vomiting and diarrhea.   HISTORY OF PRESENT ILLNESS:  Please see the H&P dictated by Dr. Arnoldo Morale  for the details of HPI. However, in short, this is a 55 year old lady  with multiple medical problems coming to the emergency department with  complaints of nausea, vomiting and diarrhea x5 days.   HOSPITAL COURSE:  1. Nausea, vomiting and diarrhea. The patient was placed on bowel rest      initially and given IV hydration. The patient had resolution of her      diarrhea and vomiting. She was started on a diet and advanced to a      carb-modified heart healthy diet. The patient subsequently had      approximately 3 episodes of loose  stool. Currently stool is sent in      for C Dif and WBC. However, my suspicion is that this is      gastroenteritis of a viral nature and that her symptoms will      resolve in short order.  2. Hypothyroidism. The patient was checked for thyroid function and      she was found to be hypothyroid with a TSH of 7.964. The patient      was started on Synthroid 25 mcg daily. The patient should followup      with her PMD of choice and have her TSH checked in approximately 6      weeks for further titration  3. Diabetes type 2. The patient was initially placed on a sliding      scale insulin and her Levemir. Due to her n.p.o. status. Her blood      sugars were well controlled and today will be restarted on her      metformin. Please caution  that the metformin could be contributory      to diarrhea however, the patient has been on metformin for quite      some time and has not had any diarrhea, so I feel pretty confident      to restarting the metformin without any implication as to      contributing to her diarrhea. All other medical problems are      stable.   DIETARY RESTRICTIONS:  The patient should be on a carb-modified, heart-  healthy diet.   ACTIVITY RESTRICTIONS:  None.      Leana Gamer, MD  Electronically Signed     MAM/MEDQ  D:  09/08/2007  T:  09/08/2007  Job:  [id number]

## 2011-01-23 NOTE — H&P (Signed)
[name], [name]               ACCOUNT NO.:  [id number]   MEDICAL RECORD NO.:  [id number]          PATIENT TYPE:  INP   LOCATION:  67                         FACILITY:  [facility]   PHYSICIAN:  Biagio Borg, MD      DATE OF BIRTH:  [birth year]   DATE OF ADMISSION:  05/10/2007  DATE OF DISCHARGE:                              HISTORY & PHYSICAL   CHIEF COMPLAINT:  Persistent nausea, vomiting.   HISTORY OF PRESENT ILLNESS:  [name] is a 55 year old white female  here for a 2nd visit to the ER, discharged August 27 with similar  symptoms as she was admitted before.  She was never able to make it back  to her pain physician to consider a decrease in pain medications.  She  is now with persistent nausea and vomiting, abdominal pain and will only  temporarily decreased with Dilaudid and Zofran IV.  She has some  hypokalemia and dehydration as well.  She is not able to really take her  p.o. medications at this time.  Her last laparoscopic evaluation of the  abdomen was 2006 with no adhesions or mechanical obstruction per Dr.  Excell Seltzer.   PAST MEDICAL HISTORY/ILLNESSES:  1. Gastroparesis thought secondary to narcotics and diabetes.  2. Chronic pain with anxiety and depression.  3. Diabetes mellitus.  4. Hypertension, now antihypertensive.  5. Chronic low back pain/myofascial discomfort.  6. GERD status post Nissen 2000.  7. History of diverticulitis.  8. History of esophageal web.  9. History of small __________ extraction.  10.History of uterine cancer status post external beam radiation      therapy.  11.History of Reglan noncompliance.  12.DJD.   SURGERIES:  1. Status post cholecystectomy.  2. Status post TAH.  3. Status post abdominal surgery with significant questionable      adhesion.   ALLERGIES:  1. CODEINE.  2. PREDNISONE.  3. TETANUS.   CURRENT MEDICATIONS:  1. Metformin 500 mg 1 p.o. daily.  2. Vicodin 10/660, 1 p.o. t.i.d.  3. Soma 350 p.r.n.  4. MS Contin  100 mg t.i.d.  5. Lidoderm patch 12 on 12 off.  6. Phenergan p.r.n.  7. __________ 30 mg q.h.s.  8. Cymbalta 60 mg p.o. daily.  9. Miralax 17 g b.i.d.   SOCIAL HISTORY:  No tobacco, no alcohol.   FAMILY HISTORY:  Positive for diabetes and cancer.   REVIEW OF SYSTEMS:  Otherwise noncontributory.   PHYSICAL EXAM:  [name] is a 55 year old white female, afebrile, blood  pressure 138/84, heart rate 80, respiratory rate 17, O2 saturation 96%.  HEENT:  Sclerae clear, TMs clear, pharynx benign.  NECK:  No lymphadenopathy JVD, or thyromegaly.  CARDIAC:  Regular rate and rhythm.  ABDOMEN:  Soft with decreased bowel sounds and epigastric tenderness.  EXTREMITIES:  No edema.   Labs include an acute abdominal series, no acute disease.  UA negative.  Lipase 58 which is normal.  CMET was potassium 3.1, glucose 170, white  blood cell count 8.4, hemoglobin 13.4.   ASSESSMENT/PLAN:  1. Abdominal pain with nausea, vomiting, presumably secondary to  gastroparesis related to diabetes and chronic pain medication as      before, although curiously she is not on a proton pump inhibitor on      a regimen so cannot rule out gastritis or peptic ulcer disease,      etc.  She is to be admitted, given IV fluids, start proton pump      inhibitor therapy IV as well as attempt clear liquid, antiemetics,      pain control.  Consider nasogastric therapy and gastroenterology      consultation.  2. Hypokalemia, replace IV.  3. Dehydration, give IV fluids.  4. Diabetes mellitus, check CBGs, give sliding-scale insulin, hold      __________ at this time until diet improves.  5. Other medical problems as above, continue on medications.  6. Code status, full.  7. Prophylaxis, give Lovenox subcu.  8. Disposition, refer home when better.      Biagio Borg, MD  Electronically Signed     JWJ/MEDQ  D:  05/11/2007  T:  05/11/2007  Job:  [id number]   cc:   Dalene Seltzer D. Maxie Better, FNP  [address] [place], [place] [address]   Charlett Blake, M.D.  Fax: Mitchell, MD,FACG  [facility]  [address] [place],  [address]

## 2011-01-23 NOTE — Discharge Summary (Signed)
NAMEHADIA, [name]               ACCOUNT NO.:  [id number]   MEDICAL RECORD NO.:  [id number]          PATIENT TYPE:  INP   LOCATION:  53                         FACILITY:  [facility]   PHYSICIAN:  Hind I Elsaid, MD      DATE OF BIRTH:  28-Sep-1955   DATE OF ADMISSION:  06/07/2007  DATE OF DISCHARGE:  06/12/2007                               DISCHARGE SUMMARY   PRIMARY CARE PHYSICIAN:  [facility].   DISCHARGE DIAGNOSES:  1. Acute gastroenteritis, resolved.  2. Diabetic gastroparesis.  3. Diabetes mellitus, type 2, on insulin.  4. History of laparoscopic lysis of adhesion for small bowel      obstruction.  5. Gastroesophageal reflux disease status post Nissen in 2000.  6. Sustained chronic low back pain/myofascial discomfort.  7. Hypertension.  8. History of uterine cancer status post external beam radiation.   MEDICATIONS:  1. Metformin 500 mg p.o. b.i.d.  2. MS-Contin 100 mg p.o. t.i.d.  3. Lidoderm patch 12 on and 12 off.  4. Cymbalta 60 mg p.o. daily.  5. Insulin Lantus 30 units subcu q.h.s.  6. Protonix 40 mg p.o. daily.  7. Phenergan 12.5 mg p.o. p.r.n.  8. Bupropion 150 mg p.o. daily.  9. Reglan 10 mg p.o. q.i.d.   CONSULTATIONS:  None.   PROCEDURES:  Abdominal x-ray:  A small bowel obstruction.  Another x-ray  on September the 30th showed compatible with resolution of the small  bowel obstruction and obstruction better and no pneumoperitoneum.   HISTORY OF PRESENT ILLNESS:  1. Please review the history of this admission; however, the patient      is a 55 year old female with a history of diabetes mellitus,      history of gastroparesis, previous history of chronic back pain on      chronic narcotic medications with multiple admissions to the      hospital for abdominal pain, nausea, and vomiting felt to be      secondary to gastroenteritis versus diabetic-induced gastroparesis      and narcotic medication, and diffuse gastroparesis.  The patient      admitted to  the hospital, kept NPO with NG tube for draining of      stomach secretion. No significant amount of stomach bile was      secreted after the ingestion of the NG tube with later removal of      the NG tube.  During her hospitalization, the patient's abdominal      pain started gradually to resolve.  There is no evidence of nausea      and vomiting during hospitalization.  A followup x-ray showed there      is now a complete resolution of the small bowel obstruction.  The      patient gradually started on clear liquid diet and advanced with      good tolerance to diet.  There is no reported abdominal pain and no      nausea and no vomiting.  We found that the patient is medically      stable to be discharged and follow up  with her primary care      physician as an outpatient.  The patient also advised to decrease      the narcotic medication as possible and to discuss with her primary      care.  2. Diabetes mellitus remained under good control with her insulin      regime at home.  The patient to continue her insulin regime with      Metformin.  3. Chronic back pain.   Further recommendation to be addressed by primary care physician.      Hind Franco Collet, MD  Electronically Signed     HIE/MEDQ  D:  06/12/2007  T:  06/12/2007  Job:  [id number]

## 2011-01-23 NOTE — H&P (Signed)
NAMESAHIRAH, RUDELL               ACCOUNT NO.:  [id number]   MEDICAL RECORD NO.:  [id number]          PATIENT TYPE:  INP   LOCATION:  2542                         FACILITY:  [facility]   PHYSICIAN:  Neysa Bonito, MD  DATE OF BIRTH:  10-27-1955   DATE OF ADMISSION:  07/19/2007  DATE OF DISCHARGE:                              HISTORY & PHYSICAL   PRIMARY CARE PHYSICIAN:  Dr. Amil Amen.   CHIEF COMPLAINT:  Nausea, vomiting and diarrhea.   HISTORY OF PRESENT ILLNESS:  This is a 55 year old Caucasian female with  past medical history significant for recurrent admissions for similar  complaint like the presentation today.  The patient was recently  discharged from the hospital 3 days ago after evaluation and management.  Today, she was complaining of abdominal pain, mainly epigastric, sharp  in nature, 10/10, with no significant radiation, associated with nausea  and vomiting.  The condition started 3 days ago and the condition  associated with diarrhea; te patient had to go to the bathroom about 6-7  times since morning today.  She stated the diarrhea is watery in nature  and not associated with blood or mucus.  The patient has recent  admission, as mentioned above, during which she was managed  conservatively for gastroparesis and she was discharged to continue on  Reglan before meals and at night and Phenergan as needed.   PAST MEDICAL HISTORY:  Her past medical history is significant for:  1. Recurrent nausea and vomiting and diarrhea.  2. Gastroparesis secondary to diabetes and narcotic medication.  3. History of recurrent small bowel obstruction, status post      laparoscopic adhesion lysis in August 2006.  4. Depression.  5. Chronic back pain.  6. Diabetes mellitus.  7. Hypertension.  Patient is not on antihypertensive medication      currently.  8. History of gastroesophageal reflux disease, status post Nissen      fundoplication in 7062.  9. History of  diverticulitis/diverticulosis.  10.History of esophageal web.  11.History of uterine cancer, status post external beam radiation      therapy.   SURGICAL HISTORY:  1. Nissen fundoplication, 3762.  2. Cholecystectomy.  3. Total abdominal hysterectomy.  4. Laparoscopic adhesiolysis.   SOCIAL HISTORY:  The patient denied tobacco abuse, ethanol abuse or  illicit drug abuse.  The patient lives with her husband currently.   FAMILY HISTORY:  Noncontributory.   REVIEW OF SYSTEMS:  The patient denied fever, chills; otherwise, review  of system is unremarkable.   EXAMINATION:  VITAL SIGNS:  Her temperature is 98.8, blood pressure is  140/82, pulse 86, respiratory rate is 20.  HEENT:  Head:  Atraumatic, normocephalic.  Eyes: PERRL.  Mouth moist.  No ulcer.  NECK:  Supple.  No JVD.  PRECORDIUM:  First and second heart sound audible, no added sounds.  LUNGS:  Bilateral fair air entry.  No adventitious sounds.  ABDOMEN:  Soft, slight tenderness in the epigastric area, no rebound  tenderness.  Bowel sounds present.  EXTREMITIES:  No lower extremities edema.  NEUROLOGICAL:  The patient is alert and oriented x3,  giving history and  moving all her extremities spontaneously.   LABORATORY DATA AND X-RAY:  Her lipase is 31, sodium 135, potassium 3.8,  chloride 104, bicarb 22, glucose 149, BUN 10, creatinine 0.65.  White  blood count is 8.2, hemoglobin is 15.3, hematocrit is 45.5 and platelet  count is 193,000.  Chest x-ray done today showing no acute findings,  stable compared to multiple prior exam and CTs, mildly prominent small-  bowel loops as before.   ASSESSMENT AND PLAN:  This is a 55 year old female with multiple  admissions for gastroparesis picture.  Today, she came with a similar  picture.  Her lipase is within normal level and the patient did not have  abdominal imaging at this time.   PLAN:  1. The patient will be admitted for further assessment and management.  2. I will start  the patient had on IV fluids.  I will hold the      narcotic tonight and I will continue the patient on Reglan,      Compazine and Phenergan for her nausea for her condition.  3. Abdominal imaging will be obtained if the amylase level came      elevated or if the patient's condition is not improved.  The reason      no imaging was ordered at this time is because of the frequent      imaging done before with a similar result when the patient      presented with symptoms of gastroparesis like this presentation.  4. Diarrhea.  This symptoms is also common  to her presentation.      Nevertheless, I will send for Clostridium difficile during this      hospitalization and I will also send for fecal leukocytes.  5. Diabetes mellitus.  I will continue the patient on a sliding scale      only, since she will remain nothing-by-mouth  After the patient      resumes her diet, we can restart her insulin, Levemir and her      metformin.  6. For deep venous thrombosis prophylaxis, I will consider Lovenox.      For gastrointestinal prophylaxis, I will consider Protonix.      Neysa Bonito, MD  Electronically Signed     EME/MEDQ  D:  07/20/2007  T:  07/21/2007  Job:  [id number]

## 2011-01-23 NOTE — Discharge Summary (Signed)
[name], [name]               ACCOUNT NO.:  [id number]   MEDICAL RECORD NO.:  [id number]          PATIENT TYPE:  INP   LOCATION:  7341                         FACILITY:  [facility]   PHYSICIAN:  Vernell Leep, MD     DATE OF BIRTH:  [birth year]   DATE OF ADMISSION:  09/06/2007  DATE OF DISCHARGE:  09/09/2007                               DISCHARGE SUMMARY   PRIMARY CARE PHYSICIAN:  Unassigned.  The patient indicates she has a  PMD in [place], but unable to recall name.   This is an addendum to the discharge summary that was done by Dr.  Liston Alba on September 08, 2007.  Please refer to that summary  for discharge diagnosis, procedures, diagnostic studies, and hospital  course until September 08, 2007.   DISCHARGE MEDICATIONS:  1. Cymbalta 60 mg p.o. daily.  2. Metformin 500 mg p.o. b.i.d.  3. Morphine Extended Release 90 mg p.o. t.i.d.  I have dispensed 30 mg      tablets, three tablets to be taken three times a day.  A total of      125 tablets have been dispensed.  4. Phenergan 12.5 mg p.o. q.6 h p.r.n.  5. Hydrocodone/acetaminophen 10/650 one tablet p.o. q.8 h p.r.n., 45      tablets have been dispensed.  6. Levimir 30 units subcutaneously q.h.s.  7. Compazine 10 mg p.o. q.6 h p.r.n.  8. Reglan 15 mg p.o. t.i.d. a.c. and h.s.  9. Lidoderm patch at prior home dosage.  10.Synthroid 25 mcg p.o. daily.   HOSPITAL COURSE:  Since September 08, 2007: the patient, this morning,  indicates that she feels fine with no complaints of pain or diarrhea  which has resolved since yesterday.  She is desirous of returning home.  She indicates that she has an appointment to see a new PMD in [place]  in February.  She has also seen two pain MD's over the last couple of  weeks and had nerve conduction studies done and has follow up  appointments with them.  She is interested in tapering her dose of  Morphine extended release to 90 mg p.o. three times a day.  At this  time, the  patient is stable with stable vital signs and physical  examination.  She is stable for discharge and follow up with her  outpatient primary medical doctor.  She has been instructed to seek  immediate medical attention if there is any deterioration of her  condition.  She is to follow up with her PMD within the two weeks with  repeat CBCs to follow up her hemoglobin and platelet count.      Vernell Leep, MD  Electronically Signed     AH/MEDQ  D:  09/09/2007  T:  09/09/2007  Job:  [id number]

## 2011-01-23 NOTE — Discharge Summary (Signed)
[name], [name]               ACCOUNT NO.:  [id number]   MEDICAL RECORD NO.:  [id number]          PATIENT TYPE:  INP   LOCATION:  5038                         FACILITY:  [facility]   PHYSICIAN:  Debbrah Alar, NP DATE OF BIRTH:  1956/03/01   DATE OF ADMISSION:  03/11/2007  DATE OF DISCHARGE:  03/13/2007                               DISCHARGE SUMMARY   DISCHARGE DIAGNOSES:  1. Diarrhea/abdominal pain.  2. Chronic nausea and vomiting with history of small bowel      obstruction.  KUB performed during this admission was negative for      small bowel obstruction.  3. Diabetes type 2.  4. Chronic low back pain on MS Contin prior to admission.  5. Depression/anxiety.  6. Gastroesophageal reflux disease.  7. Gastroparesis.   HISTORY OF PRESENT ILLNESS:  [name] is a 55 year old white female  admitted on March 11, 2007 after recent discharge 24 hours prior to this  readmission.  She returned with similar complaints which include  abdominal pain, nausea and vomiting as well as some loose stools which  occurred after eating [place] food.  She was admitted for further  evaluation and treatment.   PAST MEDICAL HISTORY:  1. Diabetes type 2.  2. Recurrent small bowel obstruction.  3. Exploratory laparoscopy with lysis of adhesions 2006 performed by      Dr. Excell Seltzer.  4. Gastroparesis.  5. Chronic lower back pain with narcotic use.  6. History of uterine cancer status post total abdominal hysterectomy      and bilateral salpingo-oophorectomy.  7. Gastroesophageal reflux disease status post fundoplication.  8. Depression.  9. Chronic narcotic use.   COURSE OF HOSPITALIZATION:  Nausea, vomiting and diarrhea.  The patient  was admitted.  Her diarrhea resolved.  Her abdominal pain improved.  She  is currently tolerating regular food without difficulty.  She was placed  on Reglan during this admission.  We will continue p.o. Reglan q. a.c.  and h.s. at time of discharge due to the  patient's history of  gastroparesis and diabetes as well as her improvement on current  regimen.  In addition, due to patient's complaint of epigastric  discomfort and history of GERD, we will also continue proton pump  inhibitor at time of discharge.   MEDICATIONS AT TIME OF DISCHARGE:  1. Reglan 10 mg p.o. q. a.c. and h.s.  2. Cymbalta 60 mg p.o. daily.  3. Levemir 30 units subcutaneous injection at bedtime.  4. Protonix 40 mg p.o. daily.  5. Glucophage 500 mg p.o. daily.  6. Soma 350 mg 2 daily as needed.  7. MS Contin 100 mg p.o. t.i.d.  8. Vicodin 10/650 mg 1 tablet p.o. q. 8 hours as needed.  9. Lidoderm patch as before.  10.Phenergan 25 mg p.o. q. 6 hours as needed.   PERTINENT LABORATORY AT TIME OF DISCHARGE:  TSH 1.5, BUN 14, creatinine  0.75, amylase and lipase normal.   DISPOSITION:  The patient will be discharged to home.   FOLLOWUP:  The patient is instructed to followup with the Hazel Sams,  nurse practitioner, in 1-2 weeks  and contact the office for an  appointment.  She is instructed to call Hazel Sams or go to the  emergency room if she develops recurrent nausea, vomiting, stomach pain  or diarrhea.      Debbrah Alar, NP     MO/MEDQ  D:  03/13/2007  T:  03/13/2007  Job:  [id number]   cc:   Hazel Sams, Nurse Practitioner

## 2011-01-23 NOTE — Discharge Summary (Signed)
NAMELEEANNE, [name]               ACCOUNT NO.:  [id number]   MEDICAL RECORD NO.:  [id number]          PATIENT TYPE:  INP   LOCATION:  74                         [facility]ACILITY:  [facility]   PHYSICIAN:  [name], MDDATE OF BIRTH:  [birth year]   DATE OF ADMISSION:  02/05/2007  DATE OF DISCHARGE:  02/09/2007                               DISCHARGE SUMMARY   DISCHARGE DIAGNOSIS:  1. Recurrent partial small bowel obstruction, complicated by narcotic      induced gastroparesis, resolved.  2. Chronic narcotic dependence, secondary to low back pain, followed      by [facility] Dr. Letta Pate, __________ abnormality.  3. Type 2 diabetes, insulin dependent.  Continue sliding scale insulin      while n.p.o.  4. Hypokalemia, exacerbated by GI loss infection, replaced and      resolved.   DISCHARGE MEDICATIONS:  1. MS Contin 100 mg q.a.m.  2. Metformin 500 mg b.i.d.  3. Levemir 30 units q.h.s.  4. Cymbalta 30 mg p.o. daily.  5. Lidoderm patch, two patches applied to the back q.h.s., remove      q.a.m.  6. Neurontin b.i.d.  7. Vicodin and Phenergan p.r.n. as prior to admission.  8. Reglan 10 mg t.i.d. a.c. and h.s., dispense 100.  9. MiraLax 17 grams p.o. b.i.d.   HOSPITAL FOLLOWUP:  With primary care Kaitlyn Skowron, Billie Bean.  To call  for appointment next week.   CONDITION ON DISCHARGE:  Medically improved and stable.   HOSPITAL COURSE:  Recurrent partial small bowel obstruction.  The  patient is a 55 year old woman diabetic, narcotic dependent woman, well-  known to the service for recurrent partial small bowel obstruction who  presented to the emergency room early the morning of 05/29.  Due to  complaints of increasing abdominal pain with nausea, vomiting, similar  to previous episodes, CAT scan done in the emergency room confirmed  partial small bowel obstruction and she was admitted for n.p.o. status.  IV fluids.   She was given IV narcotics while she was n.p.o. to avoid  narcotic  withdrawal, which may have exacerbated her symptoms.  After bowel rest  and 24 hours of NG suction, her bowel sounds had returned and she felt  improved.  She was given a trial of clear liquid diet.  GI evaluation  was performed on May 29.  NG tube was discontinued and she was  tolerating a regular diet without nausea, vomiting, on a reduced dose of  her home MS Contin.  She was felt well enough to discharge home.  She  was seen by GI who agreed with the same, and encouraged to continued  MiraLax  use and outpatient followup with Dr. Carlean Purl.  Also encouraged continued  efforts on weaning from narcotics and compliance with Reglan, each of  which have been difficult to achieve in the past.   Her other medical problems were stable as listed.      [name], MD  Electronically Signed     VAL/MEDQ  D:  04/02/2007  T:  04/02/2007  Job:  [id number]

## 2011-01-23 NOTE — Discharge Summary (Signed)
[name], [name]               ACCOUNT NO.:  [id number]   MEDICAL RECORD NO.:  [id number]          PATIENT TYPE:  INP   LOCATION:  1535                         FACILITY:  [facility]   PHYSICIAN:  Doree Albee, M.D.DATE OF BIRTH:  June 19, 1956   DATE OF ADMISSION:  07/19/2007  DATE OF DISCHARGE:  07/23/2007                               DISCHARGE SUMMARY   FINAL DISCHARGE DIAGNOSES:  1. Gastroparesis, causing nausea and vomiting  2. Diabetes mellitus.  3. Hypertension.  4. Depression.   MEDICATIONS ON DISCHARGE:  1. Reglan 15 mg q.a.c. and q.h.s.  2. Compazine 10 mg every 6 hours p.r.n.  3. Metformin 500 mg daily.  4. MS Contin 100 mg t.i.d.  5. Insulin 30 units q.h.s.  6. Cymbalta 60 mg daily.  7. Vicodin 10/650 one tablet t.i.d. p.r.n.  8. Bupropion 150 mg daily.  9. Protonix 40 mg daily.   CONDITION ON DISCHARGE:  Stable.   HISTORY:  This 55 year old lady was admitted once again with nausea and  vomiting.  She has had several admissions related to gastroparesis and  this was a similar admission once again.  She was put on intravenous  fluids and given antiemetics, and within 48 hours she improved  considerably enough to be discharged home.   FURTHER DISPOSITION:  She will follow up with her primary care  physician.      Doree Albee, M.D.  Electronically Signed     NCG/MEDQ  D:  08/20/2007  T:  08/20/2007  Job:  [id number]

## 2011-01-23 NOTE — H&P (Signed)
[name], [name]               ACCOUNT NO.:  [id number]   MEDICAL RECORD NO.:  [id number]          PATIENT TYPE:  INP   LOCATION:  [place]                         FACILITY:  [facility]   PHYSICIAN:  Jana Hakim, M.D. DATE OF BIRTH:  Jan 30, 1956   DATE OF ADMISSION:  09/06/2007  DATE OF DISCHARGE:                              HISTORY & PHYSICAL   This is an unassigned patient.   CHIEF COMPLAINT:  Nausea, vomiting and diarrhea.   HISTORY OF PRESENT ILLNESS:  This is a 55 year old female with multiple  medical problems, presenting to the emergency department with complaints  of nausea, vomiting, diarrhea for 5 days.  She also reports having  epigastric abdominal pain and describes the pain as being a 10/10 at the  worst.  She reports having 6 to 9 episodes of diarrhea, along with  vomiting.  She denies having any hematemesis, hematochezia or melena.  She denies having any fevers, chills, chest pain, shortness of breath,  dizziness or loss of consciousness.  The patient has a history of  diabetes type 2, also diabetic gastroparesis, and has had multiple  admissions for nausea and vomiting secondary to her gastroparesis.  The  patient was evaluated in the emergency department, also found at this  time to have a mildly elevated lipase level of 64.  The patient was  referred for admission.   PAST MEDICAL HISTORY:  Significant for type 2 diabetes mellitus,  gastroparesis, diabetic neuropathy, osteoarthritis and degenerative disk  disease, chronic low back pain, gastroesophageal reflux disease,  hypertension, diverticulitis/diverticulosis, history of an esophageal  web, anxiety depression, and previous history of uterine cancer status  post external beam radiation therapy.   PAST SURGICAL HISTORY:  The patient has a past surgical history of a  cholecystectomy, a total abdominal hysterectomy, laparoscopic lysis of  adhesions, and a Nissen fundoplication in 8502.   MEDICATIONS:  1.  Cymbalta 60 mg, one p.o. q. day.  2. Metformin 500 mg, one p.o. b.i.d.  3. Morphine slow-reacting, 100 mg one p.o. q.8 hours.  4. Levemir insulin 30 units subcu q.h.s.  5. Reglan 15 mg q.a.c. and q.h.s.  6. Compazine 10 mg q.6 hours p.r.n.  7. Patient also has Lidoderm patches p.r.n.  8. Vicodin 10/650, one tablet t.i.d. p.r.n.   ALLERGIES:  THE PATIENT HAS ALLERGIES TO STEROIDS, CODEINE, TETANUS  TOXOID AND LATEX.   SOCIAL HISTORY:  The patient is married, nonsmoker, nondrinker.   FAMILY HISTORY:  Noncontributory.   REVIEW OF SYSTEMS:  Pertinent for mentioned above.   PHYSICAL EXAMINATION FINDINGS:  GENERAL:  This is a older than stated  age-appearing 55 year old female in discomfort, but no acute distress.  VITAL SIGNS:  The temperature is 97.5, blood pressure 155/114, heart  rate 95, respirations 20, O2 saturations 99%.  HEENT:  Examination normocephalic, atraumatic.  Pupils equally round,  reactive to light.  Extraocular muscles are intact, funduscopic benign.  Oropharynx is clear.  NECK:  Supple, full range of motion.  No thyromegaly, adenopathy, or  jugular venous distention.  CARDIOVASCULAR:  Regular rate and rhythm.  LUNGS:  Clear to auscultation bilaterally.  ABDOMEN:  Positive bowel sounds, soft, nontender, nondistended.  EXTREMITIES:  Without cyanosis, clubbing or edema.  NEUROLOGIC:  Alert and oriented x3.  Nonfocal.   LABORATORY STUDIES:  White blood cell count 7.8, hemoglobin 14.1,  hematocrit 41.4, MCV 92.6, platelets 184, neutrophils 55%, lymphocytes  40%, sodium 140, potassium 3.5, chloride 109, carbon dioxide 24, BUN 8,  creatinine 0.64, glucose 122, albumin 3.6, AST 18, ALT 28, lipase 64.  Acute abdominal x-ray series reveals no obstruction or free air and no  acute findings.   ASSESSMENT:  A 55 year old female being admitted with:  1. Nausea, vomiting, diarrhea.  2. Mild acute pancreatitis.  3. Epigastric abdominal pain secondary number 2.  4.  Gastroparesis.  5. Type 2 diabetes mellitus.  6. Chronic pain syndrome.   PLAN:  The patient will be made n.p.o. at this time.  IV fluids have  been ordered for maintenance therapy, and antibiotics have been ordered  along with pain control therapy p.r.n.  The patient's electrolytes will  be monitored, along with her lipase levels.  An ultrasound of the  abdomen has also been ordered.  DVT and GI prophylaxis have also been  ordered, and sliding scale insulin coverage has also been ordered.  The  patient will be placed on serial blood glucose checks q.4 h., and her  Levemir therapy will be the made half dose while she is n.p.o.      Jana Hakim, M.D.  Electronically Signed     HJ/MEDQ  D:  09/06/2007  T:  09/07/2007  Job:  [id number]

## 2011-01-23 NOTE — Discharge Summary (Signed)
NAMEBRUNILDA, [name]               ACCOUNT NO.:  [id number]   MEDICAL RECORD NO.:  [id number]          PATIENT TYPE:  INP   LOCATION:  5093                         FACILITY:  [facility]   PHYSICIAN:  Ricard Dillon, MD    DATE OF BIRTH:  09/26/1955   DATE OF ADMISSION:  02/11/2007  DATE OF DISCHARGE:  02/15/2007                               DISCHARGE SUMMARY   ADMISSION DIAGNOSES:  1. Nausea and vomiting.  2. Abdominal pain.   DISCHARGE DIAGNOSES:  1. Small bowel obstruction.  2. Nausea and vomiting.  3. Abdominal pain.  4. Chronic narcotic use for pain control.  5. Hypertension.  6. Adult-onset insulin-requiring diabetes.  7. Depression.  8. Back pain.   HOSPITAL COURSE:  The patient is a 55 year old white female who has a  long past medical history of type 2 diabetes complicated by  gastroparesis and chronic abdominal pain.  She has had multiple  admissions for pain control related to this problem as well as a small  bowel obstruction which was felt to be due to gastroparesis from  narcotic use.   She was again admitted at this time for a possible small bowel  obstruction and a G-tube was placed, remained in place for 24-48 hours  with quick resolution of her small bowel obstruction, as has been her  previous course.  A phone consultation with surgery was obtained, who  stated that this patient was well-known to them and that this was due to  a nonobstructive process but, again, due to pain medication use.  She  stated to the primary care physician rounding on her at that time that  she was involved with Dr. Naaman Plummer at the pain clinic in titrating her  pain medicines down so that she would have less problems with her  chronic constipation and gastroparesis.  Her primary care physician is  Vedia Pereyra. Bean, FNP, at [facility].   During her hospital stay she was given IV hydration, bowel rest.  Actually, she was reinitiated to solid foods and began eating solid  foods on the  night prior to her discharge.  She stated that she did have  a loose bowel movement several hours past her supper yesterday but  tolerated her breakfast without diarrhea.   Other complications of her hospitalization include mild hypokalemia,  which resolved after treatment with p.o. potassium and her diabetes and  gastroesophageal reflux remained stable during her hospitalization.  On  the day of discharge she was stable, tolerating her breakfast.  She  requested to be able to eat her lunch prior to discharge to make sure  that she could tolerate it prior to going home.   She is to resume her home medications with the following changes:  1. Her Avinza is to be 90 mg every 12 hours rather than three times a      day.  2. Her Reglan is 5 mg at bedtime and breakfast.  3. Phenergan is replaced with Zofran 4 mg p.r.n. 6 hours for nausea.   She is to follow up with Wyatt Mage, FNP, within one week.  She is to  follow up with Dr. Naaman Plummer within one week.  She is considered stable for  discharge.      Ricard Dillon, MD  Electronically Signed     JEJ/MEDQ  D:  02/15/2007  T:  02/15/2007  Job:  [id number]   cc:   Meredith Staggers, M.D.  Fax: [phone]   Vedia Pereyra. Maxie Better, [place]  [place], [place] [address]

## 2011-01-23 NOTE — Discharge Summary (Signed)
[name], [name]               ACCOUNT NO.:  [id number]   MEDICAL RECORD NO.:  [id number]          [name] TYPE:  INP   LOCATION:  [place]                         FACILITY:  [facility]   PHYSICIAN:  Heinz Knuckles. Norins, MD  DATE OF BIRTH:  02-May-1956   DATE OF ADMISSION:  05/05/2007  DATE OF DISCHARGE:  05/07/2007                               DISCHARGE SUMMARY   ADMITTING DIAGNOSES:  1. Question of small bowel obstruction.  2. Chronic pain with narcotic pain management.  3. Nausea and vomiting.   DISCHARGE DIAGNOSES:  1. Question of small bowel obstruction.  2. Chronic pain with narcotic pain management.  3. Nausea and vomiting.  4. Gastroparesis secondary to narcotics and diabetes.   CONSULTANTS:  None.   PROCEDURES:  Acute abdominal series May 05, 2007:  With no active  cardiopulmonary disease.  Unremarkable abdomen, except for moderate  gastric distention with no obstruction.   HISTORY OF PRESENT ILLNESS:  [name] is 55 year old woman with  multiple previous admissions for small-bowel obstruction that is  diabetic and narcotic induced secondary to gastroparesis.  With Rita  usual onset of her gastric intestinal symptoms 5 days ago, she then  presented to [facility] Emergency Department .  Her chief complaint was  diarrhea and nausea with dry heaves.  She had some cramping abdominal  pain.  She did arrive by ambulance.  She received Phenergan en route.  She has also received Dilaudid and Compazine in [place] ER.   Please see [place] [facility]&P for past medical history, family history and current  medications.   PHYSICAL EXAM:  Notable for slightly distended abdomen with diffuse  tenderness.   HOSPITAL COURSE:  [name] was put to bowel rest.  She had a KUB  which was unremarkable.  She was continued on home medications.  Rita  [name] was started on Cipro for possible UTI.   By Rita first full hospital day (May 06, 2007), [name] was doing  better and able to take small  amounts of p.o.  Her urinalysis returned  as normal.  Her white count dropped from 12,400 to 6500.  She continued  do well and was able to eat.  She had good bowel sounds and at this time  was thought to be stable, with no evidence of ileus or obstruction; and  ready to be discharged home.   [name]'s other medical problems remained stable.  Her urinalysis  was negative and antibiotics were discontinued.   DISCHARGE EXAMINATION:  Temperature 97.9, blood pressure 108/65,  respirations 20, heart rate 69.  GENERAL APPEARANCE:  This is a pleasant woman in no acute distress.  ABDOMEN:  [name] had positive bowel sounds.  No guarding or rebound  was noted.  CHEST:  Clear.  CARDIOVASCULAR:  Examination was unremarkable.   DISPOSITION:  [name] is discharged home.  She is clearly instructed  to meet with her pain management physician in regards to finding a  better pain management strategy to avoid such high dose narcotics.   DISCHARGE MEDICATIONS:  Will continue home medications, including:  1. Metformin 500 mg  daily.  2. Vicodin 10/660 t.i.d.  3. Soma 350 mg as needed.  4. Morphine 100 mg t.i.d.  5. Lidoderm patch applied 12 off 12.  6. Phenergan as needed.  7. Levemir 30 units subcutaneously q.p.m.  8. Cymbalta 60 mg daily.   DISPOSITION:  She is to be discharged home.  [name] to follow up  with Liz Malady, nurse practitioner at [facility] in [place] next 2-4 weeks.   CONDITION AT TIME OF DISCHARGE:  Stable and improved; with clear  instructions to get up her pain management physician about changing her  regimen to avoid narcotic-induced gastroparesis and ileus.      Heinz Knuckles Norins, MD  Electronically Signed     MEN/MEDQ  D:  05/07/2007  T:  05/07/2007  Job:  [id number]   cc:   Billie D. Maxie Better, [place]  [place], [place] [address]

## 2011-01-23 NOTE — Discharge Summary (Signed)
[name], [name]               ACCOUNT NO.:  [id number]   MEDICAL RECORD NO.:  [id number]          PATIENT TYPE:  INP   LOCATION:  6294                         FACILITY:  [facility]   PHYSICIAN:  Vernell Leep, MD     DATE OF BIRTH:  June 07, 1956   DATE OF ADMISSION:  07/24/2007  DATE OF DISCHARGE:  07/25/2007                               DISCHARGE SUMMARY   PRIMARY CARE PHYSICIAN:  Was at [facility], Dr. Mack Hook. In  any case, they will not be following the patient any longer. The patient  is going to call and follow up with her previous primary medical doctor,  that is Dr. Council Mechanic - that is [facility].   DISCHARGE DIAGNOSES:  1. Abdominal pain/nausea/vomiting and diarrhea - probably secondary to      gastroparesis secondary to diabetes mellitus and chronic high-dose      narcotic medications.  2. Hypokalemia.  3. Diabetes.  4. Anemia.  5. Thrombocytopenia.  6. Chronic pain syndrome.   DISCHARGE MEDICATIONS:  1. Vicodin 10/650, one p.o. t.i.d. p.r.n.  2. Cymbalta 60 mg p.o. daily.  3. Wellbutrin XL 150 mg p.o. daily.  4. Reglan 10 mg t.i.d. before meals and bedtime.  5. Metformin 500 mg p.o. twice daily.  6. MS Contin 90 mg p.o. 3 times a day.  7. Phenergan 12.5 mg q.6h. p.r.n. p.o.  8. Protonix 40 mg p.o. daily.  9. Prochlorperazine maleate 10 mg p.o. q.4-6h. p.r.n.  10.Levemir 30 units subcutaneously nightly.   PROCEDURES:  On November 13, acute abdominal series with chest;  impression:  No acute findings. Stable exam.   PERTINENT LABORATORY DATA:  TSH 6.529. Lipase 16. Basic metabolic panel:  Sodium 765, potassium 3.3, chloride 110, bicarb 23, glucose 122, BUN 7,  creatinine 0.68, calcium 8.7. Hepatic panel remarkable for total protein  5.1, albumin 2.9, magnesium 1.9. CBC with hemoglobin 11.4, hematocrit  33.4, white blood cells 4.3, platelets 115. Hemoglobin A1c of 6.2.  Urinalysis was negative for features of UTI but had 100 mg/dL of  protein.   CONSULTATIONS:  None.   HOSPITAL COURSE AND PATIENT DISPOSITION:  For details of initial  admission, please refer to the history and physical note. In summary,  [name] is a pleasant 55 year old Caucasian female patient with  recurrent hospital admissions for similar symptoms of nausea, vomiting,  abdominal pain who has been extensively evaluated by multiple CTs of the  abdomen and GI consults in the past. She is thought to have  gastroparesis secondary to her diabetes mellitus complicated by her  chronic high-dose opiod use. On this admission, too, the patient  returned a couple of days after the recent discharge with nausea,  vomiting, diarrhea and abdominal pain and inability to keep food down.  The patient was thereby admitted with diagnosis of gastroparesis and  chronic pain syndrome. She was placed on clear liquid diet, placed on IV  fluid hydration and provided with antiemetics and parenteral pain  medications. With these, the patient has gradually improved following  which her diet was gradually advanced which she has tolerated well with  no further vomiting and resolved diarrhea. She still has intermittent  nausea, but that is at baseline. She denies any abdominal pain. I have  had a lengthy discussion with her regarding her high-dose chronic pain  medications and the narcotics which have to be tapered. She is agreeable  to trying reducing the narcotics following which her MS Contin has been  reduced slightly to 90 mg 3 times a day from 100 mg 3 times a day. The  patient also is going to follow up with pain M.D. in [place] whom she  had actually made an appointment with but was unable to follow up  secondary to her repeated admissions the last two times. Also,  [facility] called patient's spouse to indicate that they will not be  able to see patient in their office any longer secondary to her now  having both Medicaid and Medicare. The patient indicates that  she will  make an appointment to go back to see her previous M.D., Dr. Council Mechanic of  [place] [facility]. The patient at this time is stable to be  discharged. Her hypokalemia has been repleted. Also, of note, elevated  TSH. Patient, however, has no symptoms of this. Her TSH in July was  normal. The patient is advised to follow up with her PMD early next week  with repeat basic metabolic panel, CBC and TSH. Consider supplementing  her if necessary for hypothyroidism as an outpatient.      Vernell Leep, MD  Electronically Signed     AH/MEDQ  D:  07/25/2007  T:  07/25/2007  Job:  [id number]   cc:   [place] Charon, MD  Fax: [phone]

## 2011-01-23 NOTE — H&P (Signed)
[name], [name]               ACCOUNT NO.:  [id number]   MEDICAL RECORD NO.:  [id number]          PATIENT TYPE:  EMS   LOCATION:  ED                           FACILITY:  [facility]   PHYSICIAN:  Mobolaji B. Bakare, M.D.DATE OF BIRTH:  October 10, 1955   DATE OF ADMISSION:  06/14/2007  DATE OF DISCHARGE:                              HISTORY & PHYSICAL   PRIMARY CARE PHYSICIAN:  Dr. Mack Hook   CHIEF COMPLAINT:  Nausea and vomiting and diarrhea.   HISTORY OF PRESENTING COMPLAINT:  [name] is a 55 year old Caucasian  female with recurrent admissions for nausea and vomiting, abdominal pain  and diarrhea.  She was recently discharged from the hospital on June 13, 2007, after being evaluated and treated for similar symptoms.  She  was managed conservatively and was discharged on Zofran in addition to  Phenergan.  Unfortunately, she was not able to procure Zofran because of  cost and she came back to the emergency room on October 4 and was  treated symptomatically and sent home.  Now she has not been able to  keep anything down and she continues to have nausea and vomiting and  diarrhea.   These symptoms are chronic.  They have been ongoing for about 18 months  and she has had multiple hospitalizations.  In August 2006 the patient  was evaluated by surgery and she was supposed to have had recurrent  small bowel obstruction.  She underwent laparoscopic lysis of adhesions.   She has severe nausea which is associated with vomiting whenever she  eats.  If she does not eat, the patient does not particularly vomit, but  has water brush.  She had one episode of vomiting yesterday and today.  Both were food that she had and vomitus was yellowish, not bilious.  The  patient has constant epigastric pain which does not relate to the  episodes of vomiting or diarrhea.  The epigastric pain she rates a 10/10  and radiates to her back.  She has these 2 chronic pain issues and has  been on  chronic narcotics, especially for low back pain.  Diarrhea is  about 3-4 times a day, not really related to the epigastric pain.  There  is no blood in stool.  She denies any weight loss.  The patient had  colonoscopy when she turned 48 by [facility].  She does not report any  abnormality.   She had an x-ray of the abdomen done in the emergency room which showed  moderate to severely distended stomach with air-fluid level.  Note,  there is non-obstructive small and large bowel gas pattern, no free air  and no cardiopulmonary abnormality.   REVIEW OF SYSTEMS:  She denies cough, chest pain, no fever, headaches.  She has no trouble breathing.   PAST MEDICAL HISTORY:  1. Recurrent nausea and vomiting and diarrhea.  2. Gastroparesis secondary to diabetic and narcotic medications.  3. History of recurrent small bowel obstructions status post      laparoscopic adhesion lysis in August 2006.  4. Depression.  5. Chronic back pain.  6.  Diabetes mellitus.  7. History of hypertension but not on anti-hypertensives.  8. History of gastroesophageal reflux disorder status post Nissen      plication in 7989.  9. History of diverticulitis.  10.History of esophageal web.  11.History of uterine cancer status post external beam radiation      therapy.   PAST SURGICAL HISTORY:  Nissen plication in 2119, cholecystectomy, total  abdominal hysterectomy, laparoscopic adhesional lysis.   CURRENT MEDICATIONS:  1. Cymbalta 60 mg daily.  2. Metformin 500 mg b.i.d.  3. Reglan 10 mg a.c. and h.s.  4. Bupropion XL 150 mg daily.  5. Protonix 40 mg daily.  6. Phenergan p.r.n.  7. Levemir 30 units subcutaneous q.h.s.  8. Morphine p.r.n.   ALLERGIES:  CODEINE, PREDNISONE, TETANUS TOXOID.   FAMILY HISTORY:  Father passed away in 11-09-1972 of unknown cause.  Mother is  well and alive.  She is 77.   SOCIAL HISTORY:  The patient does not smoke cigarettes nor drink  alcohol.  She does not use drugs.  She lives  with her husband,  independent with activities of daily living.   PHYSICAL EXAMINATION:  Temperature 98.1, blood pressure 159/79, pulse of  77, respiratory rate 22, O2 saturation of 99%.  On examination, the  patient is awake, alert, oriented.  HEAD:  Normocephalic and atraumatic.  She appears clear, not in  respiratory distress.  Mucous membranes moist.  NECK:  No elevated JVD, no carotid bruit.  LUNGS:  Clear to auscultation.  CARDIOVASCULAR:  S1, S2, regular.  ABDOMEN:  Not distended, soft, mild epigastric tenderness.  No palpable  organomegaly. Bowel sounds present.  EXTREMITIES:  No pedal edema or calf tenderness.  Dorsalis pedis pulses  palpable bilaterally.  NEURO:  No focal neurological deficits.   LABORATORY DATA:  Lipase 37, sodium 143, potassium 3.5, chloride 107,  bicarb 24, glucose 188, BUN 11, creatinine 0.77, total bilirubin 1.1,  alkaline phosphatase 112, AST 32, ALT 33, total protein 7.3, albumin  3.8, calcium 10.  Cardiac markers at the point of care unremarkable.  White cells 12.5, hemoglobin 16.4, hematocrit 45, platelets 259,  __________ 10.8   ASSESSMENT AND PLAN:  [name] is a 55 year old Caucasian female  presenting with nausea and vomiting and abdominal pain and diarrhea  which these symptoms are recurrent problems.  She has a history of  diabetes mellitus which was diagnosed about 18 months ago, on chronic  narcotic therapy.  She also has multiple abdominal surgeries.  However,  this presentation shows that she has moderate to severely distended  stomach with air-fluid level.  She is having intractable nausea and  vomiting.  She has epigastric pain and diarrhea.  The patient has not  had any upper endoscopy done.  I believe she would benefit from one  during his hospitalization.   ADMISSION DIAGNOSES:  1. Intractable nausea and vomiting.  This is likely related to      gastroparesis for diabetes and/or chronic narcotic use, however we      need to  exclude gastric outlet obstruction given her persistent      pain.  Her abdominal x-ray is not suggestive of small bowel      obstruction.  We will keep the patient n.p.o.  We will give IV      fluids, normal saline with potassium supplement at 125 mL per hour.      The patient has refused NG tube placement.  I explained the risk of  aspiration to her.  We will consult GI for possible upper endoscopy      and she may require a gastric emptying study.  We will continue      Reglan at 5 mg IV q.6h.  2. Diarrhea.  This will probably be related to diabetic enteropathy,      however etiology is unclear at this point.  She had a normal      colonoscopy 3 years ago.  We sent stool for C.diff., fecal      leukocytes, stool fat and ova and parasites stool culture.  We will      defer for management of her chronic diarrhea to GI.  3. Diabetes mellitus.  I am holding Levemir and metformin while n.p.o.      We will place on sliding scale insulin with NovoLog q.4h.  4. Leukocytosis.  This is probably reactive.  Chest x-ray shows no      pulmonary infiltrates.  She has no fever.  We will check a      urinalysis and follow up CBC in the morning.  5. Chronic back pain.  We will continue p.r.n. IV Dilaudid 0.5 mg to 1      mg q.4h p.r.n.      Mobolaji B. Maia Petties, M.D.  Electronically Signed     MBB/MEDQ  D:  06/14/2007  T:  06/14/2007  Job:  [id number]   cc:   Marcelino Duster, M.D.

## 2011-01-23 NOTE — Discharge Summary (Signed)
[name]               ACCOUNT NO.:  [id number]   MEDICAL RECORD NO.:  [id number]          PATIENT TYPE:  INP   LOCATION:  1407                         FACILITY:  [facility]   PHYSICIAN:  Heinz Knuckles. Norins, MD  DATE OF BIRTH:  09/19/1955   DATE OF ADMISSION:  03/05/2007  DATE OF DISCHARGE:  03/10/2007                               DISCHARGE SUMMARY   ADMITTING DIAGNOSES:  1. Partial small-bowel obstruction.  2. Type 2 diabetes.  3. Known GERD.  4. Gastroparesis.  5. Chronic low back pain.   DISCHARGE DIAGNOSES:  1. Partial small-bowel obstruction.  2. Type 2 diabetes.  3. Known GERD.  4. Gastroparesis.  5. Chronic low back pain.  6. Small-bowel obstruction resolved.   CONSULTANTS:  None.   PROCEDURES:  1. Abdominal series which reveals a question of partial small-bowel      obstruction with no acute pulmonary disease.  2. Follow-up abdominal film on June 28, which showed placement of NG      tube, interval decompression of the stomach, decreased small bowel      distension.  3. Abdomen:  Two view.  Same day, June 28, showed resolving small      bowel obstruction.   HISTORY OF PRESENT ILLNESS:  [name] is well-known to the practice,  significant for multiple hospitalizations for small-bowel obstruction,  thought to be in part due to narcotic.  The patient presented  complaining of abdominal pain primarily in the right lower quadrant,  radiating in the epigastric region, associated with nausea and vomiting  for 2 days.  She had no fevers, no diaphoresis, no chest pain, no  shortness of breath, no melena, no hematochezia.  X-ray showed the  patient have a partial small-bowel obstruction.  She was subsequent  admitted for therapy.   Hospital H&P with details of patient's past medical history, current  medications, family history and social history.   HOSPITAL COURSE:  GI:  The patient was put to NG suction and  conservative management.  On this regimen, she did  well with resolution  of her small-bowel obstruction.  Her pain medications were managed by  substituting IV medication.  She is also given IV Reglan.  We will once  again try to give her regimen as an oral medication at discharge.  The  patient's other medical problems remained stable.  She was followed with  sliding scale during hospitalization.   DISCHARGE EXAM:  VITAL SIGNS:  Temperature of 98, blood pressure 105/67  rather, heart rate 71, respirations 18.  CBG was 116.  A1c this  admission was 6.8%.  GENERAL APPEARANCE:  Well-nourished, well-developed woman in no acute  distress.  CHEST:  Clear.  ABDOMEN:  Soft with positive bowel sounds.  No guarding or rebound.   DISPOSITION:  The patient is discharged home.   MEDICATIONS:  She will resume her home medications including:  1. MS Contin 100 mg p.o. t.i.d.  2. Vicodin 10/660, one tablets t.i.d. scheduled.  3. Lidoderm patch q.24 h.  4. Phenergan p.r.n. which she can take p.o.  5. Soma 350 mg p.r.n.  6. Levemir 30 units q.h.s.  7. Metformin 500 mg once daily.  8. Cymbalta 60 mg daily.  No new prescriptions are provided.   The patient is instructed to follow up with her primary care [name],  [name] at [facility], [place]..   The patient's condition at time of discharge dictation is stable.      Heinz Knuckles Norins, MD  Electronically Signed     MEN/MEDQ  D:  03/10/2007  T:  03/10/2007  Job:  [id number]

## 2011-01-23 NOTE — H&P (Signed)
[name]               ACCOUNT NO.:  [id number]   MEDICAL RECORD NO.:  [id number]          PATIENT TYPE:  INP   LOCATION:  0102                         FACILITY:  [facility]   PHYSICIAN:  Alexis Goodell. Haithcock, MDDATE OF BIRTH:  12/23/1955   DATE OF ADMISSION:  02/05/2007  DATE OF DISCHARGE:                              HISTORY & PHYSICAL   CHIEF COMPLAINT:  Nausea, vomiting and abdominal pain.   HISTORY OF PRESENT ILLNESS:  [name] is a 55 year old woman who  has a history of multiple small-bowel obstructions with multiple GI  illnesses including gastroparesis and diverticulitis who since Friday  has been having episodes of nausea, vomiting and diarrhea.  The vomitus  is a consistency of undigested food and the diarrhea is brown.  There  has been no hematochezia.  There has been no hemoptysis.  She denies any  fevers or chills at home.   PAST MEDICAL HISTORY:  1. Type 2 diabetes.  2. Diverticulitis.  3. Gastroparesis.  4. Hysterectomy.  5. Cholecystectomy.  6. Esophageal web.   FAMILY HISTORY:  Diabetes and cancer.   SOCIAL HISTORY:  No smoking, no alcohol, no illicit drugs.   DRUG ALLERGIES:  CODEINE, TETANUS AND STEROIDS.   MEDICATIONS:  Neurontin, Vicodin, Glucotrol, morphine, Lidoderm,  Phenergan and aspirin.   VITAL SIGNS:  Temperature 98.5, pulse 84, respiratory rate 18, blood  pressure 130/87.  GENERAL:  She is a pleasant woman.  She appears in no distress.  NECK:  Supple.  Jugular venous pulsations are normal.  I appreciated no  carotid bruits.  CHEST:  Clear to auscultation bilaterally.  CARDIOVASCULAR:  Regular S1-S2 with no murmurs, rubs or gallops.  ABDOMEN:  Soft.  Easily compressible in all four quadrants.  There are  no peritoneal signs.  Bowel sounds noted in all four quadrants.  Only  mild tenderness with deep palpation of the abdomen.  EXTREMITIES:  No clubbing, cyanosis or edema.  NEUROLOGIC EXAM:  Nonfocal.   LABS:  White count 13,  hematocrit 46, platelets 198, sodium 132,  potassium 3.2, chloride 103, BUN 15, creatinine 0.7, glucose 198.   CT scan shows a partial small-bowel obstruction.   ASSESSMENT/PLAN:  We will admit the patient, IV fluids running, keep  patient NPO and conservatively manage partial small-bowel obstruction.  Should patient's symptoms worsen, surgery may need to be involved.      Daniel B. Haithcock, MD  Electronically Signed     DBH/MEDQ  D:  02/06/2007  T:  02/06/2007  Job:  [id number]

## 2011-01-23 NOTE — H&P (Signed)
NAMEMANJIT, BUFANO               ACCOUNT NO.:  [id number]   MEDICAL RECORD NO.:  [id number]          PATIENT TYPE:  INP   LOCATION:  0102                         FACILITY:  [facility]   PHYSICIAN:  Carey Bullocks. Gertie Exon, MD     DATE OF BIRTH:  09-25-1955   DATE OF ADMISSION:  02/11/2007  DATE OF DISCHARGE:                              HISTORY & PHYSICAL   CHIEF COMPLAINT:  Nausea and vomiting and abdominal pain.   HISTORY OF PRESENT ILLNESS:  Ms. Bey is a 55 year old woman who has a  past medical history notable for Type 2 diabetes mellitus, complicated  by diabetic gastroparesis and chronic abdominal pain.  She has a history  of numerous admissions for pain control related to this problem.  She  was recently an inpatient at [facility] from approximately May  29th through February 09, 2007 for the same chief complaint.  During that  admission she had presented with abdominal pain, vomiting, and loose  stools for approximately three days.  Abdominal imaging reveals evidence  of a partial small bowel obstruction.  The patient was managed with a  nasogastric tube for approximately one day as well as antiemetics, pain  medicine, and intravenous fluids.  She was discharged on Sunday, the 1st  of June and began to have worsening of her symptoms later on in that  day.  She presented here to the emergency department on June 3rd after  approximately 48 hours out of the hospital.   REVIEW OF SYSTEMS:  Negative for fever or chills, positive for back  pain.   PAST MEDICAL HISTORY:  Poorly controlled Type 2 diabetes mellitus,  diabetic gastroparesis, chronic abdominal pain, history of hiatal  hernia, chronic gastroesophageal reflux disease, status post Nissen  fundoplication in 8676, chronic pain syndrome, status post laparoscopic  cholecystectomy, status post abdominal hysterectomy and radiation  therapy for uterine cancer, status post lysis of adhesions.   HOME MEDICATIONS:  1. Glucotrol 5  mg q.a.m.  2. Neurontin 600 mg daily.  3. Lasix 20 mg daily.  4. Levemir 20 units nightly.  5. Avinza 100 mg every  8 hours.  6. Hydrocodone with Acetaminophen 10/660 mg every 8 hours.  7. Phenergan 25 mg as needed.  8. Cymbalta 60 mg as needed.  9. Carisoprodol 350 mg as needed.   ALLERGIES:  TETANUS VACCINE, CORTICOSTEROIDS, AND CODEINE.   SOCIAL HISTORY:  Negative for smoking, drugs, or alcohol.   PHYSICAL EXAMINATION:  VITAL SIGNS:  Temperature 98, heart rate 94,  blood pressure 144/83, oxygen saturation 100% on room air.  GENERAL APPEARANCE:  The patient was a middle-aged white female who was  in no acute distress.  A nasogastric tube was placed which had drained  nearly 1 liter of brownish liquid gastric contents.  HEENT:  Mucous membranes were moist.  LUNGS:  Clear to auscultation bilaterally.  CARDIOVASCULAR:  Regular, S1 and S2.  No murmurs, rubs, or gallops.  ABDOMEN:  Distended.  It was soft and easily compressible.  Normal bowel  sounds.  Mildly tender in all four quadrants.  No organomegaly.  EXTREMITIES:  Warm, well-perfused.  No edema.   LABORATORY STUDIES:  White blood cell count 12.9, hemoglobin 17,  hematocrit 50, platelets 246.  Serum electrolytes were within normal  limits.  Glucose elevated at 226.  BUN 11, creatinine 0.7.  AST and ALT  were within normal limits.  Lipase was also within normal limits.   Abdominal x-ray revealed small bowel obstruction with significant  gastric distention.  There is no evidence of free air.   ASSESSMENT AND PLAN:  1. This is a woman with chronic abdominal pain related to diabetic      gastroparesis as well as multiple past abdominal surgeries, who      presents with refractory nausea and vomiting due to a small bowel      obstruction just two days following discharge for the same      condition.  I will admit her to the hospital.  She will be kept NPO      and a nasogastric tube will be kept at low wall suction.  Pain       management will be with Dilaudid and antiemetics will be with      Zofran.  Her serum electrolytes will be followed closely while she      remains on nasogastric suctioning.  I will order a follow-up      abdominal x-ray in the morning to assess any resolution of her      small bowel obstruction.  I have discussed the possibility of      referral to one of the academic medical centers in the area where      there may be clinical trials for the treatment of gastroparesis and      chronic abdominal pain, including such measures as a gastric      pacemaker.  She sounds interested in this opportunity if it is      available.  I think the patient may benefit either from Reglan or      Erythromycin as pro-motility agents if these have not been tried      recently.  2. Diabetes mellitus.  The patient will be placed on glycemic control      sliding scale with insulin and her blood sugars will be monitored      every 6 hours while she is NPO.  3. Hypovolemia.  The patient will receive intravenous fluids.      Carey Bullocks. Gertie Exon, MD  Electronically Signed     PML/MEDQ  D:  02/11/2007  T:  02/12/2007  Job:  [id number]   cc:   Billie D. Maxie Better, [address] [place], [place] [address]

## 2011-01-23 NOTE — H&P (Signed)
[name], [name]               ACCOUNT NO.:  [id number]   MEDICAL RECORD NO.:  [id number]          PATIENT TYPE:  INP   LOCATION:  1339                         FACILITY:  [facility]   PHYSICIAN:  Jana Hakim, M.D. DATE OF BIRTH:  [birth year]   DATE OF ADMISSION:  07/24/2007  DATE OF DISCHARGE:                              HISTORY & PHYSICAL   PRIMARY CARE PHYSICIAN:  Dr. Amil Amen, [facility].   CHIEF COMPLAINT:  Abdominal pain, nausea and vomiting.   HISTORY OF PRESENT ILLNESS:  This is a 55 year old female presenting for  readmission after discharge one day ago with symptoms of nausea and  vomiting along with abdominal pain.  She reports her symptoms returned  about six hours after her discharge.  She reports being unable to hold  down foods and liquids and returned to the emergency department  subsequently.  The patient has a long history of multiple admissions for  similar problems.  She has a history of diabetic  neuropathy/gastroparesis and chronic pain syndrome requiring narcotic  medications.  The patient also has a history of recurrent small-bowel  obstruction status post laparoscopic lysis of adhesions in August 2006.   PAST MEDICAL HISTORY:  Significant for the above as well as:  1. Chronic low back pain.  2. Type 2 diabetes mellitus.  3. Hypertension.  4. Gastroesophageal reflux disease.  5. Diverticulitis and diverticulosis.  6. History of an esophageal web.  7. History of uterine cancer, status post external beam radiation      therapy.  8. History of depression.   The patient has a surgical history of:  1. Nissen fundoplication in 4801.  2. Cholecystectomy.  3. Total abdominal hysterectomy.  4. Laparoscopic lysis of adhesions.   ALLERGIES:  THE PATIENT HAS ALLERGIES TO STEROIDS, CODEINE, TETANUS  TOXOID AND LATEX.   SOCIAL HISTORY:  The patient is a nonsmoker, nondrinker.   FAMILY HISTORY:  Noncontributory.   REVIEW OF SYSTEMS:  Pertinents are  mentioned above.  The patient denies  having any fevers, chills, chest pain or shortness of breath.   PHYSICAL EXAMINATION FINDINGS:  This is a well-nourished, well-  developed, older than stated age appearing, 55 year old female in  discomfort but no acute distress. back to her medications or Her  medications include:  1. Cymbalta 60 mg 1 p.o. daily.  2. Wellbutrin XL 150 mg 1 p.o. daily.  3. Metoclopramide 10 mg q.a.c. and every night.  4. Metformin 500 mg 1 p.o. b.i.d.  5. Morphine extended release 100 mg p.o. 3 times a day.  6. Phenergan 12.5 mg 1 p.o. q.6h. p.r.n.  7. Protonix 40 mg 1 p.o. daily.  8. Compazine 10 mg 1 p.o. q.6h. p.r.n. nausea/vomiting.   VITAL SIGNS: Temperature 97.1, blood pressure 146/85, heart rate 105,  respirations 18, O2 saturation 98% on room air.  HEENT:  Normocephalic, atraumatic.  Pupils equally round, reactive to  light.  Extraocular muscles are intact.  Funduscopic benign.  Oropharynx  is clear.  NECK:  Supple.  Full range of motion.  No thyromegaly, adenopathy,  jugular venous distention.  CARDIOVASCULAR:  Regular rate and rhythm.  No murmurs, gallops or rubs.  LUNGS: Clear to auscultation bilaterally.  ABDOMEN:  Positive bowel sounds, soft, nontender, nondistended.  There  is no hepatosplenomegaly.  There is mild epigastric tenderness.  There  is no guarding or rebound.  EXTREMITIES:  Without cyanosis, clubbing or edema.  NEUROLOGIC EXAMINATION:  The patient is alert and oriented x3.  There  are no focal deficits.   LABORATORY STUDIES:  White blood cell count 13.8, hemoglobin 15.7,  hematocrit 45.3, platelets 198, neutrophils 89%, lymphocytes 9%.  Sodium  139, potassium 4.5, chloride 106, carbon dioxide 23, BUN 10, creatinine  0.73, glucose 202, albumin 4.0, AST 42, ALT 44, alkaline phosphatase  109, total bilirubin 1.9, lipase 27.  Abdominal x-ray performed  revealing no free air and no signs of obstruction or ileus.   ASSESSMENT:  A  55 year old female being admitted with:  1. Abdominal pain.  2. Gastroparesis.  3. Nausea and vomiting secondary to #2.  4. Chronic pain syndrome.  5. Type 2 diabetes mellitus.   PLAN:  The patient will be readmitted for treatment and therapy.  Antiemetics have been ordered as needed IV.  The patient will also be  placed on IV fluids for maintenance therapy.  She will also be placed on  a clear liquid diet which will be advanced as tolerated.  Sliding scale  insulin coverage has also been ordered, and her metformin therapy has  been held for now.  Her pain control therapy will be ordered IV.  This  will be adjusted as needed.  DVT and GI prophylaxis have also been  ordered.      Jana Hakim, M.D.  Electronically Signed     HJ/MEDQ  D:  07/24/2007  T:  07/25/2007  Job:  [id number]

## 2011-01-23 NOTE — Discharge Summary (Signed)
NAMEMERTICE, UFFELMAN               ACCOUNT NO.:  [id number]   MEDICAL RECORD NO.:  [id number]          PATIENT TYPE:  INP   LOCATION:  71                         FACILITY:  [facility]   PHYSICIAN:  Heinz Knuckles. Norins, MD  DATE OF BIRTH:  [birth year]   DATE OF ADMISSION:  05/10/2007  DATE OF DISCHARGE:  05/13/2007                               DISCHARGE SUMMARY   ADMITTING DIAGNOSIS:  Abdominal pain with nausea and vomiting.   DISCHARGE DIAGNOSES:  Abdominal pain with nausea and vomiting.   HISTORY OF PRESENT ILLNESS:  This is 1 of multiple admissions for this  patient with abdominal discomfort with nausea and vomiting.  She does  have a history in the past of having narcotic-induced ileus.  She was  most recently hospitalized earlier in the month of August.  At that time  she was told to contact her pain management specialist to consider  reducing her narcotic medication in regard to gastroparesis secondary to  narcotics.   The patient had 2 visits to the emergency department after being  discharged August 27th for recurrent nausea and vomiting.  She had not  been able to get back to her physician for pain adjustment.  She  presents again with similar symptoms; only temporary relief with Zofran  and Dilaudid.  She had mild hypokalemia, dehydration and was  subsequently admitted.   HISTORY AND PHYSICAL/PAST MEDICAL HISTORY:  See H and P for past medical  history.  Exam on admission revealed the abdomen to be soft with  decreased bowel sounds without epigastric tenderness.   Abdominal x-ray on June 31st revealed a bowel gas pattern.  No evidence  of bowel obstruction, bowel wall thickening or free air.  Surgical clips  were seen.  No urinary tract calcification was noted.  There is a small  right pelvic phlebolith which is stable from previous studies.   HOSPITAL COURSE:  The patient was admitted to a regular bed.  She was  put on IV fluids, clear liquid diet.  On the first, she was  started on  Reglan 5 mg a.c. h.s.  The patient did well with this regimen.  She had  return of normal appetite.  She was able to tolerate a regular diet.  The patient's symptoms having resolved, she is now stable and ready for  discharge.   PHYSICAL EXAMINATION:  VITAL SIGNS:  On discharge examination,  temperature was 100.4 earlier in the morning.  She was not hot to the  touch on exam blood, pressure 119/71, heart rate was 113, respirations  20.  CBG was 121.  GENERAL APPEARANCE:  This is a pleasant elderly woman  sitting up in bed who is eating her breakfast of bacon and eggs and  blueberry muffin.  CHEST:  Chest was clear with no rales, wheezes or rhonchi.  CARDIOVASCULAR:  Cardiovascular exam with a regular rate and rhythm  without murmurs, rubs or gallops.  ABDOMEN:  She had positive bowel sounds.  There was no guarding or  rebound.  No tenderness.   LABORATORY DATA:  Final laboratory:  CBC from May 12, 2007  with a  hemoglobin 11.3 grams, white count was 4100.  Final chemistries from  May 12, 2007 with potassium of 3.9, BUN 2, creatinine 0.6, glucose  was 98.  Liver functions from May 10, 2007 were normal including  normal lipase, amylase.  Calcium was normal at 8.6.  Urinalysis was  negative.   DISPOSITION:  With the patient being able take a diet with resolution of  her symptoms she is now thought to stable and ready for discharge home.   DISCHARGE INSTRUCTIONS:  1. The patient is instructed to once again see her pain management      physician in order to consider reducing her narcotics and narcotic-      induced gastroparesis.  2. I believe the patient has some psychogenic aspects to her recurrent      symptoms.  The patient does have significant amount of distress at      home.  Instructed her to see some psychiatric assistance in      counseling for example [facility].   DISCHARGE MEDICATIONS:  The patient will resume all her home  medications  including metformin 500 mg p.o. daily, Vicodin10/60 1 t.i.d., soma 350  p.r.n., MS Contin 100 mg t.i.d., Lidoderm patch 5% on 12 off 12,  Phenergan, Levemir 30 units q.h.s., Cymbalta 60 mg daily, MiraLax 17  ________ b.i.d., _________ XL 150 mg daily.   DISPOSITION:  The patient is discharged home.  She is to follow up with  Billie Bean.  She is to set up an appointment with mental health.  She  is to set up an appointment with her pain management physician.      Heinz Knuckles Norins, MD  Electronically Signed     MEN/MEDQ  D:  05/13/2007  T:  05/13/2007  Job:  [id number]   cc:   Dalene Seltzer D. Maxie Better, [place]  [place], [place] [address]

## 2011-01-23 NOTE — H&P (Signed)
[name], [name]               ACCOUNT NO.:  [id number]   MEDICAL RECORD NO.:  [id number]          PATIENT TYPE:  INP   LOCATION:  71                         [facility]ACILITY:  [facility]   PHYSICIAN:  [name], MDDATE OF BIRTH:  1956-05-06   DATE OF ADMISSION:  03/07/2007  DATE OF DISCHARGE:                              HISTORY & PHYSICAL   CHIEF COMPLAINT:  Nausea, vomiting, abdominal pain, consistent with  recurrent small bowel obstruction.   HISTORY OF PRESENT ILLNESS:  A 55 year old woman well known to this  service for recurrent hospitalizations related to same, presents to the  [facility] emergency room with 2 days of nausea, vomiting and abdominal  pain like that of her previous small bowel obstruction episodes.  The  frequent recurrence is complicated by large doses of chronic narcotic  use, as well as diabetic gastroparesis.  Currently, the patient is  complaining of her usual pain, primarily in the right lower quadrant,  radiating up into the gastric region, associated with nausea, vomiting  the last 2 days, and diarrhea.  She has had no fever, no diaphoresis, no  chest pain, no shortness of breath, no melena or hematemesis, no recent  travel, no med changes.  She was last hospitalized June 3rd through June  7th for same, and prior to that May 29th through June 1st and then March  26th through March 28th, all within this year.  Last exploratory lap has  not been since 2006, as she has previously resolved with conservative  medical management since that time.  Of note, in 2006 she was also  referred to [facility] for consideration of a gastric pacemaker, but  declined at that time due to large doses of narcotics and will be  reconsidered for treatment of her gastroparesis once off narcotics.  This has been explained to the patient, but this has been difficult to  achieve.   PAST MEDICAL HISTORY:  1. Type 2 diabetes, insulin dependent.  2. Recurrent partial small bowel  obstruction, last exploratory lap      with lysis of adhesions in 2006 by Dr. Excell Seltzer.  3. Gastroparesis.  4. Chronic low back pain without significant MRI abnormalities as her      cause for chronic disability.  5. History of uterine cancer status post total abdominal hysterectomy      with bilateral salpingo-oophorectomy, status post external beam      radiation treatment.  6. GERD, status post Nissen fundoplication in 9924.  7. Depression.  8. Chronic pain syndrome, followed at pain clinic by Dr. __________.   CURRENT MEDICATIONS:  Include:  1. MS Contin 100 mg p.o. t.i.d.  Note, the patient has not been on of      Avinza.  2. Vicodin 10/660 1 tablet t.i.d. scheduled.  3. Lidoderm patch q.24 h.  4. Phenergan p.r.n.  5. Soma 350 p.r.n.  6. Levemir 30 units subcu q.h.s.  7. Metformin 500 mg daily.  8. Cymbalta 60 mg daily.   Note, the patient is no longer taking her Reglan, saying that she does  not take it, because it does  not work to keep her from having these  episodes.  Likewise, she is no longer taking Neurontin, because it does  not help her pain.  The patient is on no other oral diabetic  medications, other than metformin, as her other needs are met by  insulin.   ALLERGIES:  INCLUDE CODEINE, TETANUS SHOTS AND STEROIDS.   FAMILY HISTORY:  Her mother is living, age 76 and in reasonably good  health.  Her father passed away at the age of 23, secondary to blood  poisoning.   SOCIAL HISTORY:  She is disabled.  She lives with her husband and  reports that they are in a happy marital state without discord.  She  does not smoke, does not drink.   REVIEW OF SYSTEMS:  Is negative, as HPI above and indicates no lower  extremity swelling, no cough, no weight changes.   PHYSICAL EXAM:  VITAL SIGNS: Temperature of 97.3, blood pressure 114/70,  pulse of 114, respirations 16, satting 100% on room air.  GENERAL:  She is a woman who is in no acute distress but clearly   uncomfortable with liquid vomitus and spittle in the bedside pan next to  her on the stretcher, in the hallway of the ER.  HEENT:  Unremarkable.  LUNGS:  Clear to auscultation bilaterally without wheeze, crackle,  increased work of breathing, albeit shallow respiration effort.  CARDIOVASCULAR:  Distant, slightly tacky but overall regular.  No  appreciable murmurs.  ABDOMEN:  Slightly distended, protuberant and obese with rare bowel  sounds if any.  No rebound, guarding to deep palpation but diffusely  tender.  EXTREMITIES:  Show only trace edema, no swelling or erythema  bilaterally.  NEUROLOGICAL:  She is awake, alert, oriented x3, and without gross  cognitive motor or sensory deficit, although affect is flat and appears  depressed.   LABORATORY DATA:  Significant for white count of 13.5, hemoglobin 16.5,  platelets normal at 247.  Chemistries normal with a notable glucose of  173, lipase normal at 43.  Urinalysis shows calcium oxalate crystals  with many bacteria but few WBCs or RBCs seen.  Abdominal series shows no  acute disease within the lungs on chest x-ray, but abdominal findings  consistent with partial small bowel obstruction.   ASSESSMENT/PLAN:  1. Recurrent partial small bowel obstruction.  This is multifactorial,      recurrent with previous episodes of same. fourth occurring this      year.  She has generally responded to conservative medical      treatment, and therefore will place NG tube, encourage bowel rest      at least overnight and follow-up KUB in the morning.  Once the KUB      is improved and symptomatically passing flatus.  Will clamp and      remove NG tube, start clears and advance diet as tolerated.      Despite narcotics complicating her gastroparesis and recurrent      symptoms.  We will continue low-dose scheduled IV morphine to      prevent narcotic withdrawal from complicating her current symptoms,     given her dependence on high dose of morphine.   We will then change      IV p.r.n., once she is tolerating her MS Contin.  I have again      discussed with the patient need to reduce her dependence, under the      supervision of the pain clinic, on high doses of narcotics  for the      best attempts at trying to manage his disease.  Will also again      write for IV Reglan.  The patient refuses to take his orally, as      she does not believe in the benefit of this medication.  Question      benefit from a trial of erythromycin, as an alternative for the      gastroparesis component.  I have no plans for surgical evaluation,      unless she fails to respond clinically or has other abnormalities.      Will recheck CBC in the morning, though I suspect her leukocytosis      is merely reactive to the stress of this situation.  2. Type 2 diabetes.  Will cover with sliding scale insulin and only      low-dose, long-acting insulin while n.p.o.  Will use Lantus in      place of her home Levemir, and resume higher home dose of      metformin, once taking p.o.  3. Gastroesophageal reflux disease, status post Nissen fundoplication,      remote.  We give IV PPI, while NPO.  4. Gastroparesis.  Please see problem #1.  Will place on IV Reglan      with consideration of erythromycin in place of Reglan, post      discharge.  5. Chronic low back pain followed at pain clinic with chronic high      dose narcotics.  Continue this and lidocaine patch.  See #1 for      further details.   Please see orders.      [name], MD  Electronically Signed     VAL/MEDQ  D:  03/07/2007  T:  03/08/2007  Job:  [id number]

## 2011-01-23 NOTE — Discharge Summary (Signed)
[name], [name]               ACCOUNT NO.:  [id number]   MEDICAL RECORD NO.:  [id number]          PATIENT TYPE:  INP   LOCATION:  [place]                         FACILITY:  [facility]   PHYSICIAN:  Rexene Alberts, M.D.    DATE OF BIRTH:  [birth year]   DATE OF ADMISSION:  06/14/2007  DATE OF DISCHARGE:  06/18/2007                               DISCHARGE SUMMARY   DISCHARGE DIAGNOSES:  1. Abdominal pain, nausea and vomiting secondary to gastroparesis.  2. Chronic back pain on high doses of opioids.  3. Type 2 diabetes mellitus.   DISCHARGE MEDICATIONS:  1. Reglan, increased to 15 mg  before each meal and nightly.  2. Compazine 10 mg every 6 hours as needed for nausea.  3. Metformin 500 mg daily.  4. MS Contin 100 mg t.i.d.  5. Insulin (Levemir) 30 units subcutaneous nightly.  6. Cymbalta 60 mg daily.  7. Vicodin 10/650 mg t.i.d. p.r.n.  8. Bupropion 150 mg daily.  9. Protonix 40 mg daily.   DISPOSITION:  The patient was discharged to home in improved and stable  condition on June 18, 2007.   FOLLOW UP:  She was advised to follow up with her primary care  physician, Dr. Amil Amen, in 3-5 days and with gastroenterologist Dr.  Silvano Rusk on November 3, at 8:45 a.m.   CONSULTATIONS:  Dr. Silvano Rusk and Dr. Scarlette Shorts, [facility]  Gastroenterology.   PROCEDURES:  1. CT scan of the abdomen and pelvis on June 15, 2007.  The results      revealed no evidence of bowel obstruction or other acute process.      A possible tiny nonobstructing calculus in the lower pole of the      right kidney.  CT scan of the pelvis revealed a stable examination      with no acute findings.  2. Acute abdominal series on June 14, 2007.  The results revealed      that the stomach is moderately to severely distended with gas and      fluid.  Nonobstructed small and large bowel gas pattern.  No free      air.  No acute cardiopulmonary abnormality.   HISTORY OF PRESENT ILLNESS:  The patient is a  55 year old woman with a  past medical history significant for recurrent hospitalizations for  nausea, vomiting and abdominal pain, history of chronic low back pain  and type 2 diabetes mellitus.  She presented to the emergency department  on June 14, 2007, with a chief complaint of nausea, vomiting ,  abdominal pain, and diarrhea.  The patient was subsequently admitted for  further evaluation and management.   For additional details, please see the dictated history and physical.   HOSPITAL COURSE:  Problem 1.  ABDOMINAL PAIN, NAUSEA, AND VOMITING  SECONDARY TO GASTROPARESIS:  The patient was initially evaluated with an  abdominal x-ray in the emergency department.  The abdominal x-ray  revealed a moderately distended stomach with gas and fluid.  NG  placement was considered, however, it was not done.  At the time of the  initial assessment, the  patient was afebrile and hemodynamically stable.  Her lipase was within normal limits at 37.  Her liver transaminases were  within normal limits.  She was initially made n.p.o. and was started on  maintenance fluids with normal saline.  Stool specimens were ordered to  rule out C. difficile and/or any other enteric infection.  One stool  collected was negative for C. difficile.  Although, the patient had a  chief complaint of diarrhea as well, she did not experience diarrhea  during the hospitalization.   For further evaluation, a CT scan of the abdomen and pelvis with  contrast was ordered.  The results revealed no acute abdominal or pelvic  findings.  Subsequently, gastroenterologist, Dr. Henrene Pastor was consulted.  He recommended increasing the Reglan to 15 mg  before each meal and  nightly and adding Compazine for nausea.   During the first 24-48 hours of the patients hospitalization, she  received intravenous Dilaudid.  However, the morphine and Vicodin were  placed on hold.  She began to feel symptomatically better and became  clinically  improved.  Clear liquids were started and then a soft, low  residue diet followed.  The patient tolerated the clear liquids and the  solid foods well without any nausea or vomiting.  She still continues to  have mild episodic abdominal pain.   The patient is currently stable today, June 18, 2007.  She will be  discharged to home on Reglan and Compazine as recommended by Dr. Henrene Pastor.  The patient, unfortunately, will  have to continue on her chronic opioid  medications for chronic low back pain.   Problem 2.  TYPE 2 DIABETES MELLITUS:  The patient was treated with  sliding scale NovoLog during the hospitalization.  Her capillary blood  glucose was fairly controlled.      Rexene Alberts, M.D.  Electronically Signed     DF/MEDQ  D:  06/18/2007  T:  06/19/2007  Job:  [id number]   cc:   Marcelino Duster, M.D.   [address] Chuck. Henrene Pastor, [place] [address]   Carl E. Gessner, MD,FACG  [facility]  [address] [place], [place] [address]

## 2011-01-23 NOTE — H&P (Signed)
[name], [name]               ACCOUNT NO.:  [id number]   MEDICAL RECORD NO.:  [id number]          PATIENT TYPE:  INP   LOCATION:  58                         FACILITY:  [facility]   PHYSICIAN:  Evie Lacks. Plotnikov, MDDATE OF BIRTH:  Jan 27, 1956   DATE OF ADMISSION:  05/05/2007  DATE OF DISCHARGE:                              HISTORY & PHYSICAL   CHIEF COMPLAINT:  Abdominal pain, nausea, vomiting, chronic low back  pain.   HISTORY OF PRESENT ILLNESS:  The patient is a 55 year old female with  multiple previous admissions for small-bowel obstruction, diabetic and  narcotic-induced gastroparesis who presents with the usual onset of her  gastrointestinal symptoms about 5 days ago. Mostly she complained of  diarrhea and nausea with dry heaves starting 5 days ago with some  diarrhea in addition.  Complained of cramping abdominal pain.  She was  able to eat some and keep her medicines up to a couple days ago.  Was  brought to the hospital by ambulance, received Phenergan in route, here  received Dilaudid and Compazine.  She feels better however is  complaining of a fair amount of pain.   PAST MEDICAL HISTORY:  Diabetes, chronic low back pain, recurrence  partial small-bowel obstruction, diabetic gastroparesis   SOCIAL HISTORY:  Denies alcohol or tobacco.   FAMILY HISTORY:  Positive for diabetes.   CURRENT MEDICATIONS:  1. Metformin 500 mg daily.  2. Vicodin 10/660 three times a day.  3. Soma 350 as needed q.i.d.  4. Morphine sustained release 100 mg three times a day.  5. Lidoderm p.r.n.  6. Phenergan 25 mg p.r.n.  7. Levemir 30 units every evening.  8. Cymbalta 60 mg once a day.   REVIEW OF SYSTEMS:  Prior to that she was doing fairly well. She has  been seeing Dr. Letta Pate for chronic back pain and pain medication  prescription. The rest of the 18-point review of systems is as above or  negative.   PHYSICAL EXAMINATION:  Temperature 99.8, pulse 104, respirations 18,  sats  96% on room air on admission. By the time I saw the patient, she  was in no acute distress.  HEENT:  Slightly dryish oral mucosa.  NECK:  Supple, no thyromegaly or bruit.  LUNGS:  Clear.  No wheezes or rales.  HEART:  S1, S1, slightly tachycardiac.  ABDOMEN:  Slightly distended, diffusely tender, no rebound symptoms.  No  masses felt.  RECTAL:  Not done.  No organomegaly felt.  SKIN:  Clear without rashes.  EXTREMITIES:  Lower extremity without edema.  Calves nontender.  LS  spine tender with range of motion on palpation.  She is alert, oriented and cooperative. Cranial nerves II-XII normal,  deep tendon muscle strength within normal limits.   LABORATORY DATA:  White count 12.4, hemoglobin 15.9, MCV 93.  Urinalysis  with 40 ketones, 3-6 WBCs.  Sodium 137, potassium 3.7, creatinine 0.79,  ALT, AST normal. Abdominal x-ray with probable partial ileus no acute  pathology.   ASSESSMENT/PLAN:  1. Small bowel obstruction, partial, recurrent.  2. Nausea and vomiting, recurrent.  3. Diarrhea, recurrent.  4.  Dehydration.  I will treat with IV fluids.  5. Chronic low back pain.  Will use IV morphine, later switch to her      usual medicines.  6. Gastroparesis probably of a mixed nature (narcotic      induced/diabetic) as a cause of the above problems. Will try Reglan      p.o. however I am sure it has been tried before.  7. Type 2 diabetes.  Will use sliding scale and her usual maintenance      Levemir. May reduce to 1/2 if not good oral intake.  8. DVT prophylaxis with Lovenox.  9. Possible urinary tract infection Cipro 5 days.      Evie Lacks. Plotnikov, MD  Electronically Signed     AVP/MEDQ  D:  05/05/2007  T:  05/06/2007  Job:  [id number]   cc:   Rita Knuckles. Norins, MD  [address]  [place]  [place] [address]   [name], M.D.  Fax: [phone]

## 2011-01-23 NOTE — H&P (Signed)
[name], [name]               ACCOUNT NO.:  [id number]   MEDICAL RECORD NO.:  [id number]          PATIENT TYPE:  INP   LOCATION:  0104                         FACILITY:  [facility]   PHYSICIAN:  Juanito Doom, MD       DATE OF BIRTH:  November 02, 1955   DATE OF ADMISSION:  06/07/2007  DATE OF DISCHARGE:                              HISTORY & PHYSICAL   PRESENTING COMPLAINT:  Abdominal pain, nausea, vomiting and diarrhea.   HISTORY OF PRESENT ILLNESS:  This is a 55 year old white female patient  who came in because of abdominal pain.  She has had nausea, vomiting and  diarrhea over the past 3 days.  Patient still moved bowel about 3 times  today.  She is complaining of abdominal pain.  She has received morphine  in the hospital.  She currently has an NG tube to empty her stomach.  She denies any chest pain.  No cough.  No shortness of breath.  No  headache.  No dizziness.  No dysuria.  No joint pains, no leg swelling.   PAST MEDICAL HISTORY:  Patient has had recurrent abdominal pain.  She  has been in this hospital for admission for several times because of her  abdominal pain.  In 2006 patient had laparoscopic lysis of adhesions for  small bowel obstruction.  Patient has had recurrent small bowel  obstructions.  Patient had omental adhesions during the 2006  laparoscopic surgery.  Also patient has history of diabetes type 2.  She  has history of degenerative disc disease of L-spine.   CURRENT MEDICATIONS:  Include metformin 500 mg twice daily, Protonix 40  mg once daily, Cymbalta 60 mg once daily, Phenergan, __________ and  metoclopramide.   SOCIAL HISTORY:  Patient does not smoke cigarettes, does not drink  alcohol.  Does not use drugs.   FAMILY HISTORY:  Noncontributory.   She has allergies to CODEINE, PREDNISONE, and TETANUS SHOT.   PHYSICAL EXAMINATION:  Shows a blood pressure of 143/61, pulse rate 74,  respirations 16, temperature 99.7.  HEAD AND NECK:  Shows the presence of NG  tube.  She is not pale.  She is  not jaundiced.  Neck is supple.  CHEST:  Clear to auscultation bilaterally.  No wheezing and no crackles.  CARDIOVASCULAR:  Shows normal heart sounds, no murmur.  No gallops.  Pulses are palpable in the limbs.  ABDOMEN:  Soft, nontender, no masses palpable. __________ .  EXTREMITIES:  Shows no edema, no cyanosis.  CENTRAL NERVOUS SYSTEM:  Patient is alert and oriented x3.  There is no  focal neurologic deficits.   LABS:  Shows a UA, urine is cloudy and positive for leukocytes esterase.  WBC is 7.9, hemoglobin 13, platelets 183.  Sodium 141, potassium 3.5,  chloride 111, bicarb 23, BUN 15, creatinine 0.7, glucose 160, lipase  __________ .  Abdominal x-ray shows possible obstruction.   ASSESSMENT:  1. Gastroenteritis.  2. Urinary tract infection.  3. Diabetic gastroparesis.  4. History of chronic low back pain from degenerative disc disease.   PLAN:  Is to admit patient to surgical  floor.  She has been seen this  evening by surgery.  I will keep her n.p.o. for now.  NG tube for  draining stomach secretion.  I will check her finger sticks every 6  hours while patient is n.p.o.  Will give her IV fluid, normal saline  with potassium at 100 ml/hour.  Continue Dilaudid p.r.n. for pain.  I  will also give IV Levaquin 500 mg q.24hours for 3 doses to treat UTI.  I  will also check stool for C-diff.      Juanito Doom, MD  Electronically Signed     GA/MEDQ  D:  06/07/2007  T:  06/07/2007  Job:  [id number]

## 2011-01-26 NOTE — Discharge Summary (Signed)
[name], [name]               ACCOUNT NO.:  [id number]   MEDICAL RECORD NO.:  [id number]          PATIENT TYPE:  INP   LOCATION:  6468                         [facility]AC[facility]   PHYSICIAN:  Marletta Lor, MDDATE OF BIRTH:  June 25, 1956   DATE OF ADMISSION:  12/03/2006  DATE OF DISCHARGE:                               DISCHARGE SUMMARY   FINAL DIAGNOSIS:  Ileus.   ADDITIONAL DIAGNOSES:  1. Diabetes.  2. Gastroparesis.  3. Chronic back and myofascial pain syndrome.  4. Chronic narcotic use.  5. History of depression.  6. Hypertension.   DISCHARGE MEDICATIONS:  1. MS Contin 100 mg t.i.d.  2. Cymbalta 60 mg daily.  3. Altace 2.5 mg daily.  4. Phenergan 25 mg every 6 hours p.r.n. nausea.  5. Vicodin 10/650 one every 6 hours p.r.n. pain.  6. Levemir 20 units at bedtime.  7. Glucotrol XL 5 mg daily.  8. Neurontin 300 mg t.i.d.  9. Furosemide 20 mg daily.   HISTORY OF PRESENT ILLNESS:  The patient is a 55 year old white female  for history of diabetes, gastroparesis and a chronic pain syndrome.  She  has had multiple hospital admissions for episodic nausea, vomiting and  abdominal pain.  The patient presented with similar complaints and  radiographically was noted to have dilated small bowel on images  consistent with partial small-bowel obstruction.  The patient was  admitted to the hospital for further evaluation and treatment.   LABORATORY DATA AND HOSPITAL COURSE:  The patient was seen in  consultation by General Surgery.  The patient was initially supported  with IV fluids, made n.p.o. and placed on nasogastric suctioning.  Serial radiological evaluation revealed clearing of the dilated loops of  bowel.  On the 3rd hospital day, the patient was asymptomatic with no  abdominal pain, nausea and had experienced a normal bowel movement.  She  has tolerated a clear liquid diet and has done well since  discontinuation of her nasogastric tube.  Laboratory studies were  unremarkable.  Blood sugars were monitored during the hospital course  the patient was covered with a sliding-scale regimen of a short-acting  insulin.  Chemistries remained normal.  Sedimentation rate was 7.  White  count was normal on 2 occasions.   DISPOSITION:  The patient was discharged on her preadmission  medications.  She will make attempts to moderate her narcotic use and  her diet will be slowly advanced, as will her exercise level.   CONDITION ON DISCHARGE:  Improved.      Marletta Lor, MD  Electronically Signed     PFK/MEDQ  D:  12/06/2006  T:  12/06/2006  Job:  [id number]

## 2011-01-26 NOTE — H&P (Signed)
NAMEJOSIANE, LABINE               ACCOUNT NO.:  [id number]   MEDICAL RECORD NO.:  [id number]          PATIENT TYPE:  INP   LOCATION:  0104                         FACILITY:  [facility]   PHYSICIAN:  Biagio Borg, M.D. LHCDATE OF BIRTH:  [birth year]   DATE OF ADMISSION:  04/14/2005  DATE OF DISCHARGE:                                HISTORY & PHYSICAL   CHIEF COMPLAINT:  Abdominal pain with vomiting and diarrhea for the last ten  hours.   HISTORY OF PRESENT ILLNESS:  [name] is a 55 year old white female here  with the above after just discharged April 13, 2005.  She started eating  this morning after discharge from the hospital yesterday.  She has had no  fever but has had actually marked increased nausea, vomiting, pain,  abdominal distention, loose stools since starting eating this morning.  This  is overall the fourth ER evaluation since April 2006 for the same problem.  She was seen by general surgery at [facility], no records  available, apparently has been trying to hold off on surgery.  She has not  been seen by gastroenterology, no EGD.  She also can not keep her pills  down.  KUB tonight shows marked gastric distention.  Now for admission for  evaluation and treatment.   PAST MEDICAL HISTORY:  Illnesses:  1.  History of lumbar disk disease, chronic low back pain with degenerative      disk disease.  2.  Obesity.  3.  Depression.  4.  Right ovarian cyst, questionable complex by CT on July 27.  5.  Fatty liver.  6.  Diabetes mellitus.  7.  Partial small-bowel obstruction as above, primarily left lower      questionable adhesions.  8.  Hiatal hernia.  9.  Chronic constipation.   ALLERGIES:  STEROIDS.   CURRENT MEDICATIONS:  1.  MS Contin 30 mg q.8h.  2.  Relafen __________  mg b.i.d.  3.  Neurontin 300 mg one t.i.d.  4.  Cymbalta 60 mg p.o. every day.  5.  Vicodin 5/500 one q.8h.  6.  Reglan 5 mg q.6h.  7.  Zelnorm 6 mg b.i.d.   SOCIAL HISTORY:  No  tobacco, no alcohol.  Lives __________  at home.   Family history, review of systems:  Otherwise noncontributory.   PHYSICAL EXAMINATION:  GENERAL:  Ms. Majid is a 55 year old white female.  VITAL SIGNS:  Blood pressure 146/94, respirations 20, heart rate 120,  afebrile, O2 saturation 95%.  ENT:  Sclerae are clear.  TMs clear.  Pharynx benign.  NECK:  Without lymphadenopathy, JVD, thyromegaly.  CHEST:  No rales, wheezing.  CARDIAC:  Regular rate and rhythm.  No murmur.  ABDOMEN:  Soft.  Positive bowel sounds.  Severe epigastric tenderness.  No  guarding, no rebound.  EXTREMITIES:  No edema.   LABS:  Lipase normal.  Electrolytes with potassium 3.1, glucose 219, BUN  creatinine 9 and 0.7.  LFTs slightly up at SGOT 102, SGPT 187, consistent  with previous LFTs determinations last several weeks.  White blood cell  count 10.4, hemoglobin  16.1.  Acute abdominal series shows drastically  marked gastric distention noted with an obvious question of a proximal small-  bowel obstruction, either partial or full.   ASSESSMENT/PLAN:  1.  Abdominal pain with nausea, vomiting, and abnormal KUB as above, most      likely partial or full small-bowel obstruction versus gastric outlet      obstruction, etcetera.  I suspect this time may warrant surgery.  She      will need to be admitted, given intravenous fluids, Phenergan for      nausea, pain control with morphine intravenously, nasogastric tube to be      placed, and she will need to be NPO as well.  Will likely need surgical      consult in the morning plus or minus gastroenterology evaluation.      Recheck films in the morning.  2.  Diabetes mellitus.  Check capillary blood sugar.  Apply sliding scale      insulin as needed.  She has been diet-controlled at home.  Check      hemoglobin A1c with next labs.  3.  Other medical problems.  Hold all other medications as listed above as      medications while hospitalized should replace those  p.o.       JWJ/MEDQ  D:  04/14/2005  T:  04/14/2005  Job:  [id number]   cc:   Wyatt Mage, NP  [place]

## 2011-01-26 NOTE — Assessment & Plan Note (Signed)
MEDICAL RECORD NUMBER:  [id number].   [name] is here in follow up of her low back pain which we felt was most  consistent last time with facet arthropathy although not definitely. She was  placed on a trial of Relafen which dropped her pain by about 25% when she  uses it. She did not increase it to q.12h. and still takes once a day. Her  MRI of the lumbar spine that was performed on May 06, 2004 revealed some  mild degenerative changes of L3-L4 with small left lateral disk protrusion.  There is some disk bulging L4-L5, L5-S1. She has had no further scan then.  She reports that her pain is predominantly in the left low back region at  the L5-S1 level radiating down to the left leg but rarely below the knee.  Pain is worse when she sits or bends. It is worse when she drives in the  car. 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 She reports her pain as an 8/10 overall today. She is  using Vicodin 10 for breakthrough pain every six hours as needed as well.  Lidoderm patches provide some local relief.   REVIEW OF SYSTEMS:  On review of systems, the patient denies any chest pain,  shortness of breath, cold, flu, wheezing, or coughing symptoms. She does  report some spasms in the back. Denies any visual problems. She has some  difficulty sleeping. Denies agitation or depression. Does report some  reflux, diarrhea, and incontinence. Denies abdominal pain or discomfort,  however. The patient denies weakness, fever, chills, skin breakdown,  dizziness, etc.   PHYSICAL EXAMINATION:  VITAL SIGNS:  On physical examination today, blood  pressure is 115/75, pulse 93, respiratory rate 20. She is saturating 98% on  room air.  GENERAL:  The patient walks with a fairly normal gait. Her affect is bright.  Appearance is well kept.  MUSCULOSKELETAL/NEUROLOGICAL:  On evaluation of the low back, she had  continued mild scoliotic curves to the right in  thoracic spine and left in  the lower lumbar spine which were mild. Pelvis seemed to be fairly  symmetric, right to left. Pain was caused with pressure upon the lumbar  facets and paraspinals at L4 through S1. PSIS areas were tender in the left  more so than the right. Bending was achieved to about 40 degrees with  discomfort and 10 to 15 degrees with extension with less discomfort. Lateral  bending and rotation were diminished when turning to the right. Spasm was  felt in the lumbar lower paraspinals as well as flank musculature. Motor  exam was stable. She did have some give away weakness still on the left side  which was inconsistent and more noticeable with ankle dorsi flexion, plantar  flexion, and knee extension. Provocative maneuvers and straight leg raise  testing caused most severe pain. Patrick's test caused pain but lesser so on  the left. Right sided provocative testing was normal. Compression test was  negative.  HEART:  Regular rate and rhythm.  LUNGS:  Clear.   ASSESSMENT:  1. Low back pain with presentation more consistent with lumbar radiculopathy     at this time although this is by no means a definitive diagnosis. There     may be a facet component as well. Obesity plays a factor.  2. Low thoracic and lumbar scoliosis.   PLAN:  1. Will send the patient to physical therapy  to work on low back and muscle     strengthening and range of motion. Would like her to learn the McKenzie     technique as well.  2. Will increase her Relafen 500 mg q.12h.  3. May need another MRI of her back done to discern any further disease in     the L spine. I will see if there is another hard copy of her recent MR in     our files that were sent to the office today.  4. I began her on long acting opiate today, Kadian 20 mg q.d., #30. She was     given a sample voucher.  5. She will be continued on Vicodin 10 one q.6h. p.r.n. for breakthrough     pain for now.  6. I will see the patient  back in one month's time.      Meredith Staggers, M.D.   ZTS/MedQ  D:  04/12/2004 12:45:25  T:  04/12/2004 14:07:45  Job #:  [id number]   cc:   Teresa Pelton, M.D.  [address] [place]  [place] [address]  Fax: [phone]

## 2011-01-26 NOTE — H&P (Signed)
NAMEMCKENZYE, CUTRIGHT               ACCOUNT NO.:  [id number]   MEDICAL RECORD NO.:  [id number]          PATIENT TYPE:  INP   LOCATION:  5122                         FACILITY:  [facility]   PHYSICIAN:  [name], MDDATE OF BIRTH:  1955/11/30   DATE OF ADMISSION:  05/24/2006  DATE OF DISCHARGE:                                HISTORY & PHYSICAL   CHIEF COMPLAINT:  Nausea, vomiting, abdominal pain and diarrhea.   HISTORY OF PRESENT ILLNESS:  Mrs. Roorda is a 55 year old Caucasian woman  with a history of gastroparesis and multiple hospitalizations for nausea and  emesis, who presents with recurrence of symptoms.  She was recently  hospitalized in the first week of September 2007 for nausea, vomiting and  diarrhea secondary to severe gastroparesis.  Historically, the patient has  had previous hospitalizations in July and August of 2007.  She has been  worked up for gastroparesis and has been recommended to undergo gastric  pacemaker, but has been reluctant to proceed until now.  During her previous  hospitalization, a CT scan demonstrated no significant abdominal process.  She was treated with IV fluids and an n.p.o. status with resolution of  symptoms.  However, after discharge on the 10th of September, the patient  redeveloped diarrhea 3 days ago and nonbloody emesis 2 days ago.  Her last  meal was apple sauce at approximately 3 p.m. on May 24, 2006.  She  noted progressive fatigue due to a lack of p.o. intake, but denied any  orthostatic symptoms.  She also developed mild left lower quadrant abdominal  pain, but did not notice any bloody emesis or melena or hematochezia.  She  denies any aspiration in the setting of her emesis.  The patient states that  these symptoms are identical to her previous episodes.  She denies any  fevers or chills.   PAST MEDICAL HISTORY:  1. Diabetes mellitus.  2. Chronic nausea and vomiting secondary to gastroparesis.  3. History of total  abdominal hysterectomy secondary to uterine cancer,      status post external beam radiation.  4. Hiatal hernia, status post Nissen fundoplication.  5. Gastroesophageal reflux disease.  6. History of laparoscopic cholecystectomy.  7. History of laparoscopic adhesiolysis.  8. Chronic pain syndrome.   ALLERGIES:  CODEINE and STEROID INJECTIONS, which cause bradycardia, and  TETANUS VACCINE.   MEDICATIONS:  1. MS Contin 60 mg p.o. t.i.d.  2. Vicodin 10/650 mg p.o. t.i.d. p.r.n.  3. Reglan 10 mg p.o. q.a.c. and h.s.  4. Altace 10 mg daily.  5. Lantus 18 mg subcu nightly.  6. Metformin 500 mg q.a.m.  7. Zoloft 350 mg p.o. b.i.d.  8. Cymbalta 60 mg p.o. daily.  9. Phenergan p.r.n.   SOCIAL HISTORY:  The patient lives in the [place] area with her husband.  She is disabled.  She denies any smoking history, but has occasional mild  alcohol use.   FAMILY HISTORY:  Notable for mother who has diabetes and breast cancer and  secondary relatives with coronary artery disease.   REVIEW OF SYSTEMS:  Notable for chronic back pain, nausea,  vomiting and  abdominal pain.  The rest of the 12 review of systems were reviewed and are  negative.   PHYSICAL EXAM:  VITAL SIGNS:  Blood pressure is 140/87, pulse 86,  temperature 98.3.  GENERAL:  The patient appears mildly ill-appearing in no acute distress.  HEENT:  Normocephalic, atraumatic.  Pupils are equal, round and reactive to  light.  Extraocular movements are intact.  NECK:  No JVD, no carotid bruits.  CARDIOVASCULAR:  Regular rhythm.  Normal rate.  No murmurs, rubs, or  gallops.  LUNGS:  Clear to auscultation bilaterally.  ABDOMEN:  Positive hypoactive bowel sounds and mild left lower quadrant  tenderness.  No rebound or guarding.  No hepatosplenomegaly.  EXTREMITIES:  No cyanosis, clubbing or edema.  Distal pulses 2+.  SKIN:  Mild tenting.  PSYCHIATRIC:  Normal affect.  MUSCULOSKELETAL:  No significant joint deformities or effusion.   NEUROLOGIC:  Cranial nerves II-XII grossly intact.  No focal musculoskeletal  or sensory deficits.   RADIOLOGIC FINDINGS:  A KUB demonstrates a colonic ileus.   LABORATORY DATA:  Labs demonstrate a potassium of 3.1, a BUN of 8, a  creatinine of 0.6, white count of 8.7 and a hemoglobin of 14.9.  L LFT  demonstrates an AST of 41 and an ALT of 56.   ASSESSMENT AND PLAN:  This is a 55 year old Caucasian woman with symptoms  consistent with a recurrence of a severe gastroparesis.  She has also  developed a ileus.   1. Gastroparesis/ileus:  The patient will be admitted and provided      supportive care with antiemetics and intravenous hydration.  Potassium      will be corrected.  Further workup including the possibility of      transfer to a facility offering gastric pacing may be considered during      this hospital stay.  If the patient improves over the next 48 hours,      the patient can be worked up as an outpatient for this procedure.  2. Chronic pain:  The patient will be covered with intravenous morphine.      Oral medications are available if the patient feels she can tolerate      oral intake.  3. Diabetes mellitus:  Lantus and metformin will be held during the      patient's nothing-by-mouth status.  She will be covered with sliding-      scale insulin in the interim.  4. Gastroesophageal reflux disease:  The patient will be treated with      intravenous ranitidine to minimize any significant aspiration      pneumonitis that the patient may be at risk for in the setting of her      emesis.  5. Deep venous thrombosis prophylaxis:  The patient will be started on      subcutaneous heparin.      Crista Elliot, MD   Electronically Signed     ______________________________  Jannifer Rodney Asa Lente, MD    RA/MEDQ  D:  05/25/2006  T:  05/25/2006  Job:  [id number]

## 2011-01-26 NOTE — Discharge Summary (Signed)
NAMEROBYNNE, [name]               ACCOUNT NO.:  [id number]   MEDICAL RECORD NO.:  [id number]          PATIENT TYPE:  INP   LOCATION:  3020                         FACILITY:  [facility]   PHYSICIAN:  Sean A. Loanne Drilling, M.D. [facility] OF BIRTH:  01/16/1956   DATE OF ADMISSION:  03/21/2006  DATE OF DISCHARGE:  03/23/2006                                 DISCHARGE SUMMARY   REASON FOR ADMISSION:  Nausea, vomiting, diarrhea.   HISTORY OF PRESENT ILLNESS:  A 55 year old woman admitted by Dr. Asa Lente on  March 21, 2006 with nausea, vomiting and diarrhea.  Please refer to her  History and Physical for details.   HOSPITAL COURSE:  She was admitted and treated with symptomatic therapy and  intravenous fluids.  GI was consulted, and they recommended continuation of  the same.  C. difficile toxin was ordered, but she did not move her bowels  during the admission beyond that time, so that specimen could not be  collected.  However, she steadily improved during her hospitalization, and  by March 23, 2006 she was alert, oriented, ambulatory, eating a usual diet  and requesting to go home.   Regarding her diabetes, she was continued on her outpatient regimen, and did  well from this standpoint.   DISCHARGE DIAGNOSES:  1.  Nausea, vomiting and diarrhea, uncertain etiology.  2.  Type 2 diabetes.  3.  Other chronic medical problems, not active during this hospitalization.   MEDICATIONS AT THE TIME OF DISCHARGE:  Exactly the same as prior to  admission.  These are reported as:  1.  Altace 2.5 mg a day.  2.  Cymbalta 60 mg daily.  3.  MS Contin 60 mg 3 times a day.  4.  Reglan 20 mg 4 times daily.  5.  Levemir 12 units q.h.s.  6.  Protonix 40 mg daily.  7.  Soma 350 mg 3 times a day.  8.  Vicodin 10/650 as needed for chest pain.   Of note is that the patient states that she is out of her morphine.  She is  given a 7-day prescription, and asked to call Dr. Naaman Plummer for future refills  that are  appropriate.  No restriction on diet or activity.           ______________________________  Jacelyn Pi Loanne Drilling, M.D. [facility]     SAE/MEDQ  D:  03/23/2006  T:  03/23/2006  Job:  [id number]   cc:   Teresa Pelton, M.D.  Fax: [phone]   Meredith Staggers, M.D.  Fax: [phone]

## 2011-01-26 NOTE — Assessment & Plan Note (Signed)
MEDICAL RECORD NUMBER: [id number]   Josselyne is back regarding her low back pain with of her complaints of  radiculopathy after  her last visit with me. I sent her for an L5/S1  epidural. She found she had no results with this. Today she states her pain  is most predominantly in the low back region with occasional radiation to  the left buttock. Her morphine ran out June 15th. She is down to her last  few Hydrocodone today. She continues on Cymbalta 60 mg daily, and Neurontin  300 mg t.i.d. She also has Relafen 500 mg q.12h that she uses as well as  Flexeril 5 mg q.8h scheduled. She reports that her pain currently is a 10  out of 10. With the morphine her pain was an 8 to 9 out of 10. She describes  the pain as sharp, stabbing and constant, and it interferes with most of her  activities. She has been in bed for about 2 weeks since running out of  morphine. The pain worsens with any type of activity. Walking seems to be  the worst. Also is bothered by standing and bending. She is able to walk  about 5 to 10 minutes without having to stop currently. She states that she  needs assistance with her bathing, dressing, toileting, meal preparation,  household duties and shopping due to her pain problems.   REVIEW OF SYSTEMS:  The patient reports trouble walking with spasms.  Decreased appetite. Denies cardiorespiratory or constitutional symptoms  today.   SOCIAL HISTORY:  Husband is supportive. He accompanies her on her visit  today.   PHYSICAL EXAMINATION:  VITAL SIGNS:  Blood pressure 127/67, pulse is 90,  respiratory rate 16.  GENERAL:  The patient is alert, fairly bright but in mild distress. She  walks with a limp, favoring the left side. The left hemipelvis appears  elevated over the right side. She remains obese.  HEART:  Regular rate and rhythm.  LUNGS:  Clear.  ABDOMEN:  Soft, nontender.  EXTREMITIES:  Coordination of lower extremities is fair except for pain  inhibition. Sensory  exam is intact. Reflexes are 1+ to 2+ throughout.  Straight leg testing was equivocal. Patrick's testing was equivocal as well.  The patient had a lot of pain inhibition and weakness with the left lower  extremity with motor functioning today. Motor function in the right lower  extremity was 4 to 4+ out of 5.  BACK:  I palpated the low back and she had pain along the lumbar facets at  L3, L4, L4, L5 and L5-S1 bilaterally, left greater than right. She had some  mild flattening of the lordotic curve. I measure no focal leg length  discrepancy as the pelvis appeared to be generally tilted up to the left  side. Facet maneuvers were positive today. She had pain with flexion and  extension although extension was more uncomfortable today.   ASSESSMENT:  1.  Ongoing low back pain with multiple etiologies. The patient had no      response with the L5-S1 epidural injection. The pain appears more facet      mediated today.  2.  Obesity.  3.  Depression.  4.  Low thoracic/lumbar levoscoliosis.   PLAN:  1.  Will set the patient up for bilateral facet injections by Dr. Letta Pate      at L3, L4, L4, L5 and L5-S1.  2.  Increase MS Contin to 60 mg q.8h #90.  3.  Vicodin 10/660 1 q.6h  p.r.n. #120.  4.  Continue Neurontin, Cymbalta and Flexeril as mentioned above. She may      use Lidoderm for localized tenderness.  5.  I will see her back pending injections with Dr. Letta Pate. I did provide      her with 6 Xanax to use prior to injections for anxiety.       ZTS/MedQ  D:  03/09/2005 15:20:47  T:  03/09/2005 18:53:14  Job #:  [id number]   cc:   Charlett Blake, M.D.  [address] [place]  [place] [address]  Fax: [phone]
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
## 2011-01-26 NOTE — Assessment & Plan Note (Signed)
[name] is back regarding her low back pain.  She underwent lumbar RFs on  November 22, 2005 by Dr. Letta Pate after she had positive responses with her  medial branch blocks.  Patient states that her pain now has increased since  she has had the lumbar RFs done.  These were done on the left at L3, L4, L5.  The patient states that pain now is increased in the back.  She is having  aching and tingling in her legs generally in the posterior distribution in  the thighs and calves.  Pain today is a 10/10.  She recently has been  stricken with the flu and has been suffering with this, although pain  increased prior to her flu illness.  Patient describes the pain as sharp,  burning, stabbing, aching, and constant.  She is getting no sleep,  literally.  She states that pain interferes with general activity,  relationships with others, enjoyment of life on a severe level.  She uses  her tens unit somewhat at home for severe pain.  She also is on MS Contin 60  mg q.8h. as well as Vicodin 10/650 one q.6-8h. p.r.n.  She has Lidoderm  patches which helps somewhat.  She uses Cymbalta at home also.   REVIEW OF SYSTEMS:  Positive for spasms, dizziness, confusion, depression,  loss of taste, weight loss, labile sugars, vomiting, diarrhea, limb  swelling.   SOCIAL HISTORY:  Without significant change.   PHYSICAL EXAMINATION:  VITAL SIGNS:  Blood pressure 95/49, pulse 130,  respiratory rate 17.  She is saturating 99% on room air.  GENERAL:  Patient is a bit anxious and in mild distress.  She is alert and  oriented.  Affect is generally flat.  NEUROLOGIC:  Gait is wide-based.  Coordination is fair with significant pain  inhibition.  Reflexes are brisk at 2+ and sensation was intact.  All  provocative testing was positive today as she was painful with any leg  movements, literally.  She had minimal flexion/extension of low back today.  Motor testing was poor due to patient's pain inhibition.  Cognitively she  is  appropriate.  No other focal neurological findings were found today.  HEART:  Regular rate and rhythm.  LUNGS:  Clear.  ABDOMEN:  Soft, nontender.   ASSESSMENT:  1.  Chronic low back pain with history of facet disease, although patient      has not responded to radiofrequency ablations.  2.  History of myofascial cervical pain.  3.  History of gastrointestinal dysmotility.  4.  Anxiety/depression.  5.  Obesity.   PLAN:  1.  Will check another MRI of the lumbar spine and rule out progressive      spondylitic disease.  I question as to whether patient could have SI      joint arthropathy.  2.  Give patient a trial of oral prednisone starting at 40 mg a day and      tapering off over approximately two weeks' time.  3.  Give patient a trial of Soma 350 mg q.8h. p.r.n.  4.  I refilled MS Contin 60 mg q.8h. and Vicodin 10/650 one q.6-8h. p.r.n.      #90.  5.  Remain on Cymbalta 60 mg daily and Lidoderm patches daily to low back.  6.  Encourage tens use.  7.  See the patient back in one month's time.      Meredith Staggers, M.D.  Electronically Signed     ZTS/MedQ  D:  01/14/2006 11:54:24  T:  01/15/2006 09:56:54  Job #:  [id number]

## 2011-01-26 NOTE — Assessment & Plan Note (Signed)
MEDICAL RECORD NUMBER:  [id number].   [name] is back regarding her cervical and low back pain. She had an initial  median branch block performed by Dr. Letta Pate which provided two days of  relief. She is set up for a RF on March 15 with Dr. Letta Pate again. Ultram  was not tolerated due to nausea and vomiting. She has been back on her  morphine and Vicodin since discharge from the hospital in late December, and  this had been working fairly well for her. She has been out of the morphine  for about a week, however but denies any withdraw symptoms.   The patient was in the hospital for ileus with nausea and vomiting. No  obvious source of this was found. Initially, it was thought this was due to  her narcotics, but the symptoms did not resolve initially with decreases in  her medications. Apparently, she went to [facility] as well who was  unsuccessful in finding a further source. The patient denies any recent  issues with her nausea and bloating although she does get a bit bloated  after meals.   The patient rates her pain at a 9/10, describes it is constant and aching.  More recently over the last three to four weeks, she has had neck pain going  from the neck into the shoulder blade areas. It worsens with bending,  sitting and standing for prolonged periods of time.   REVIEW OF SYSTEMS:  The patient reports spasms, confusion, depression,  anxiety, high blood sugars, poor appetite, shortness of breath. Full review  is in the health and history section of the chart.   SOCIAL HISTORY:  Pertinent positives are listed above.   PHYSICAL EXAMINATION:  VITAL SIGNS:  Blood pressure is 118/74, pulse 129,  respiratory rate 18, O2 saturation 98%.  GENERAL:  The patient is pleasant in no acute distress. She is a bit flat.  Gait is slightly wide based but stable. Coordination is fair. Reflexes are  2+, and sensation is normal.  HEART:  Regular rhythm but is slightly tachycardic.  CHEST:  Clear.  ABDOMEN:  Nontender, slightly distended, with bowel sounds positive.  EXTREMITIES:  No clubbing, cyanosis, or gross edema.   Neck was painful to palpation along two separate points on each trapezius  muscle. The upper trapezius was much tighter and symptomatic than the mid  trapezius today. Flexion caused pain as well as rotation to the left and  right. Facet maneuvers and Spurling's test were equivocal to negative today.  The patient had intact shoulder movement and had no signs of impingement or  bicipital tendinitis today. Lumbar spine was painful to extension today and  somewhat with flexion.   ASSESSMENT:  1.  Lumbar facet arthropathy. The patient for RF of L4-5 and L5-S1 levels      with Dr. Letta Pate next month.  2.  New neck pain, most consistent currently with myofascial dysfunction.  3.  Gastrointestinal/dysmotility syndrome without clear cause. Possibly due      to narcotics.  4.  Anxiety/depression.  5.  Obesity.   PLAN:  1.  The patient will follow up for Dr. Letta Pate for RF ablations in March.  2.  After informed consent, we injected the trapezius muscles bilaterally at      four separate trigger points using 2 cc of 1% lidocaine. The patient      tolerated these well.  3.  Will start the patient on Zelnorm 6 mg b.i.d. prior to meals.  4.  I refilled MS Contin 60 mg q.8h. #90 and Vicodin 10/650 one q.6h. p.r.n.      #90.  5.  I would like for her to remain on Cymbalta and increase to 60 mg daily.  6.  The patient may use Lidoderm patches for local relief.  7.  I will see the patient back in approximately two months' time after her      injections are completed with Dr. Letta Pate.      Meredith Staggers, M.D.  Electronically Signed     ZTS/MedQ  D:  10/26/2005 17:06:14  T:  10/27/2005 06:55:42  Job #:  [id number]

## 2011-01-26 NOTE — Discharge Summary (Signed)
[name], [name]               ACCOUNT NO.:  [id number]   MEDICAL RECORD NO.:  [id number]          PATIENT TYPE:  INP   LOCATION:  83                         FACILITY:  [facility]   PHYSICIAN:  Heinz Knuckles. Norins, M.D. LHCDATE OF BIRTH:  May 18, 1956   DATE OF ADMISSION:  04/17/2006  DATE OF DISCHARGE:  04/22/2006                                 DISCHARGE SUMMARY   ADMISSION DIAGNOSES:  1. Gastroparesis with nausea and vomiting.  2. Diabetes.  3. Chronic pain management.   DISCHARGE DIAGNOSES:  1. Gastroparesis with nausea and vomiting.  2. Diabetes.  3. Chronic pain management.   CONSULTANT:  None.   PROCEDURES:  The patient had acute abdominal series with [facility] at time of  admission with no acute cardiopulmonary findings and no evidence of any  acute abdominal process.   HISTORY OF PRESENT ILLNESS:  [name] is a 55 year old white married  female, mother of one with longstanding history of gastroparesis with  intermittent episodes nausea, vomiting and diarrhea.  She has been seen both  by [facility], Gatha Mayer, M.D. [facility], as well as at [facility].  The patient is followed by [name] at [facility].  The patient reports that she had increasing problems with  nausea, vomiting and poor p.o. intake for the week prior to admission with  10-12 bowel movements daily and emesis 8-10 times daily.  She had no  hematemesis, hematochezia, melena, or other signs of GI bleeding.  Because  of her poor p.o. intake and nausea, vomiting, diarrhea.  She was admitted  for IV fluids and observation.  Please see H&P for complete past medical  history, family history and social history as well as medication list.   ADMITTING LAB:  Hemoglobin of 14 grams, white count 8600.  Chemistries were  unremarkable except for low potassium of 3.3, creatinine was 0.8, glucose  was 136.  The patient had mildly elevated AST of 86, ALT of 84.  Total  bilirubin was  elevated at 1.5.   HOSPITAL COURSE:  1. GI: Patient with a history of gastroparesis.  She had a study in the      last year and therefore this was not repeated.  The patient was put to      a full liquid diet.  She slowly improved being able to tolerate clear      liquids, full liquids, and finally a full diet.  The patient's      laboratories were not repeated.  She is felt to be stable from medical      perspective in regards to gastroparesis and ready for discharge.   1. Diabetes.  The patient is followed up by [name].  The patient      had a hemoglobin A1c this visit with 7.1% indicating reasonable control      on present medical regimen.  She will continue the same.   1. Chronic pain management.  The patient on very high doses of narcotics      which no doubt contributes to her gastroparesis.  However, the patient      was unable to manage pain without her present regimen.  Therefore this      will be continued.   DISCHARGE EXAMINATION:  The patient is lying in bed.  She is awake, she is  alert.  She reports that she is feeling well and is ready to return home.  Vital Signs: Temperature 98.6, blood pressure 123/79, pulse 92, respirations  were 26. General Appearance:  This is a mildly overweight Caucasian woman in  no acute distress. CARDIOVASCULAR:  2+ radial pulses.  Her precordium was  quiet with regular rate and rhythm.  Abdomen was soft.  She had positive  bowel sounds.  No further examination conducted.  There were no follow-up  repeat labs.   DISCHARGE MEDICATIONS:  The patient will resume all of her home medications  including:  1. MS Contin 60 mg t.i.d.  2. Vicodin 10/650 t.i.d.  3. Reglan increased to 10 mg a.c. and h.s.  4. Altace 10 mg daily.  5. Levemir 18 units nightly.  6. Cymbalta 60 mg daily.   DISPOSITION:  The patient is sent home.  She will follow up with [name] in 7-10 days.  The patient's condition at time of discharge dictation  is stable and  improved.           ______________________________  Heinz Knuckles Norins, M.D. [facility]     MEN/MEDQ  D:  04/22/2006  T:  04/22/2006  Job:  [id number]

## 2011-01-26 NOTE — Letter (Signed)
May 21, 2006     [place]:  RENNAE, FERRAIOLO  MRN:  [id number]  /  DOB:  Feb 01, 1956   Dear Lessie Dings:   Cary Wilford is a patient in the [facility] office of [facility]  and indicates that she has had a jury summons for June 20, 2006.   Mrs. Prezioso has a chronic pain condition requiring her to be on a number of  medications including narcotics.  She also has had several hospitalizations  in the last 12 months and is somewhat fragile medically.  I would  appreciate it if you would excuse her from jury duty.   If I can be of further assistance, please do not hesitate to call.    Sincerely,      Billie D. Maxie Better, FNP  [place]esto Charon, MD   BDB/MedQ  DD:  05/21/2006  DT:  05/21/2006  Job #:  [id number]

## 2011-01-26 NOTE — Assessment & Plan Note (Signed)
HISTORY:  [name] has had continued back pain since I last saw her.  She  went to the emergency room two weeks ago, and had a Dilaudid shot given to  her for some relief.  She had an MRI performed which showed minimal  degenerative disk disease and facet disease in the lumbar spine.  There was  no acute or focal lesion noted.  The patient still describes significant  pain at a 10/10 in the low back, radiating down the left leg to the foot.  She states the pain interferes with her sleep, general activity and the  enjoyment of life on a severe level.  Sleep is very poor due to pain,  because she has problems getting comfortable.  She generally sleeps on her  right side.  She describes her pain as a constant tingling and sharp pain.  The pain is made worse with walking, bending, sitting and standing.  It  improves somewhat with rest and with her medications.  She finds that she  only gets three or four hours of relief when she takes the MS Contin with  the Vicodin.  She can walk five to 10 minutes without having to stop.   REVIEW OF SYSTEMS:  The patient reports numbness, tingling, trouble with  walking, spasm, depression and loss of taste.  She also reports night  sweats, low sugars, constipation and a decreased appetite.   SOCIAL HISTORY:  The patient lives with her husband.  She is not currently  working.   PHYSICAL EXAMINATION:  VITAL SIGNS:  Blood pressure 140/83, pulse 98,  respirations 16, saturation 96% on room air.  GENERAL:  The patient is flat with a decreased affect.  NEUROLOGIC/MUSCULOSKELETAL:  She is alert and oriented x3.  She walks with a  limp, favoring the left side.  Reflexes are 1+ to trace throughout.  It  would be difficult to say that they were decreased on the left side,  particularly the ankle.  Sensory function is subjectively decreased along  the calf and thigh, as well as the sole of the foot.  Weakness was noted  with knee extension and flexion and ankle  dorsiflexion and plantar flexion  at 4/5.  There seemed to be a large pain component to her weakness.  Straight leg testing was positive on the left side.  The patient had severe  pain with palpation of both facet regions as well as lumbar spinous  processes and paraspinous muscles.  It was difficult to palpate deeply for  pain due to significant tactile tenderness.  HEART:  A regular rate and rhythm.  LUNGS:  Clear.  SKIN:  Intact.   ASSESSMENT/PLAN:  1.  Low back pain with lumbar spondylosis/DDD and questionable      radiculopathy.  She seems to have more radiculopathy in a left S1      distribution today than last month.  The MRI does not corroborate with      the clinical diagnosis.  The patient seems more open to injections      today, now that her pain has continued.  2.  Low thoracic lumbar level scoliosis.  3.  Obesity.  4.  Depression.   PLAN:  1.  Will send the patient for a non-selective lumbar epidural at L5-S1, to      see how she responds to this.  2.  In the meantime, will increase MS Contin to 60 mg q.12h. with Vicodin 10      mg for break-through pain  q.6h. p.r.n.  I dispensed #60 and #120 of      these respectively today.  3.  Will resume Neurontin, titrating up quickly to 300 mg t.i.d.  The      patient seems to have stopped this medication, despite my orders to      continue and increase.  4.  Continue Cymbalta at 60 mg daily.  5.  For sleep and muscle spasm, will increase Flexeril to 10 mg, one to two      q.h.s., and #60 of these were dispensed today.  6.  Continue Lidoderm for localized pain.  7.  I will see the patient back pending injections with Dr. Luanna Salk.      Kirsteins.  8.  Consider nerve conduction EMG studies of the low back and lower      extremities.  9.  Collect UDS at the next visit.      ZTS/MedQ  D:  12/26/2004 15:20:12  T:  12/26/2004 15:54:16  Job #:  [id number]   cc:   Teresa Pelton, M.D.  [address] [place]   [place] [address]  Fax: [phone]

## 2011-01-26 NOTE — Discharge Summary (Signed)
[name], [name]               ACCOUNT NO.:  [id number]   MEDICAL RECORD NO.:  [id number]          PATIENT TYPE:  INP   LOCATION:  9509                         FACILITY:  [facility]   PHYSICIAN:  Gwendolyn Grant, M.D. LHCDATE OF BIRTH:  October 07, 1955   DATE OF ADMISSION:  04/05/2005  DATE OF DISCHARGE:  04/13/2005                                 DISCHARGE SUMMARY   DISCHARGE DIAGNOSES:  1.  Partial small bowel obstruction, resolving.  2.  Gastroparesis.  3.  Diabetes type 2.  4.  Dehydration.   HISTORY OF PRESENT ILLNESS:  The patient is a 55 year old white female with  three day history of nausea, vomiting and diarrhea associated with left  upper quadrant cramping and abdominal pain.  The patient was admitted for  further evaluation.   PAST MEDICAL HISTORY:  1.  Gastroesophageal reflux disease.  2.  Uterine cancer.  3.  Status post total abdominal hysterectomy and radiation therapy for      uterine cancer, 2000.  4.  Diabetes type 2, diet controlled.  5.  Status post cholecystectomy 2002.  6.  Status post Nissen fundoplication in 3267.  7.  Diverticulosis by colonoscopy April 2005.  8.  Degenerative disc disease.   HOSPITAL COURSE:  PROBLEM #1:  PARTIAL SMALL BOWEL OBSTRUCTION:  The patient  was admitted and underwent a KUB which showed a questionable small bowel  obstruction.  A CT scan of the abdomen and pelvis was performed which showed  partial small bowel obstruction.  A surgical consult was requested and they  recommended making the patient NPO and placing an NG tube if the patient  began to vomit.  The patient was noted to have a rising white blood cell  count which was thought to be possibly reaction to underlying  diverticulitis.  A CT scan of abdomen was repeated on April 09, 2005 which  was negative except for noting a fatty liver.  A gastric emptying study was  performed on May 08, 2005 which showed markedly delayed gastric emptying.  Small bowel series  performed on May 09, 2005 was negative.  The patient  was started on Reglan 5 mg q.6h. for questionable gastroparesis as noted on  gastric emptying study.  The patient was able to advance diet on Zelnorm and  Reglan and was discharged to home on April 13, 2005.   LABORATORY DATA:  At discharge the hemoglobin was 15.1, hematocrit 45.3,  white blood cell count 9.0, platelet count 260,000.  BUN 5, creatinine 0.8.   DISCHARGE MEDICATIONS:  1.  MS Contin 30 mg p.o. q.8h.  2.  Relafen 500 mg p.o. b.i.d.  3.  Neurontin 300 mg p.o. t.i.d.  4.  Cymbalta 60 mg p.o. daily.  5.  Vicodin 5/500 one tablet p.o. q.8h.  6.  Reglan 5 mg p.o. q.6h.  7.  Zelnorm 6 mg p.o. b.i.d.   FOLLOW UP:  A follow up appointment was scheduled for patient to see [name], N. P. on April 20, 2005 at 11:30 A.M.      Melissa S. Inda Castle, NP  Gwendolyn Grant, M.D. [facility]  Electronically Signed    MSO/MEDQ  D:  06/13/2005  T:  06/13/2005  Job:  [id number]   cc:   [name], N.P. (primary care Avalee Castrellon)

## 2011-01-26 NOTE — Discharge Summary (Signed)
[name], [name]               ACCOUNT NO.:  [id number]   MEDICAL RECORD NO.:  [id number]          PATIENT TYPE:  INP   LOCATION:  1510                         FACILITY:  [facility]   PHYSICIAN:  Gwendolyn Grant, M.D. LHCDATE OF BIRTH:  02/15/1956   DATE OF ADMISSION:  05/02/2005  DATE OF DISCHARGE:  05/15/2005                                 DISCHARGE SUMMARY   DISCHARGE DIAGNOSES:  1.  Abdominal pain with nausea, vomiting and diarrhea.  2.  Severe gastroparesis by gastric-emptying study.  3.  Diabetes with presumed autonomic dysfunction.  4.  Chronic low back pain with narcotic dependence managed by pain clinic,      Dr. Naaman Plummer.  5.  Depression.  6.  Fatty liver.  7.  Positive Helicobacter pylori.   DISCHARGE MEDICATIONS:  1.  Flora-Q one p.o. daily.  2.  Protonix 40 mg b.i.d.  3.  Xifaxan 200 p.o. t.i.d.  4.  Sliding scale insulin with NovoLog.  5.  Reglan 10 mg p.o. q.a.c. and h.s.  6.  Amaryl 1 mg p.o. daily.  7.  Zelnorm 6 mg p.o. b.i.d.  8.  MS Contin 30 mg p.o. t.i.d. on hold.  9.  Relafen 500 mg p.o. b.i.d. on hold.  10. Ambien CR 10 mg p.o. q.h.s. p.r.n.  11. Wellbutrin XL 150 mg p.o. daily on hold.  12. Lexapro 10 mg p.o. daily on hold.   PROCEDURES THIS HOSPITALIZATION:  1.  MRCP showing normal state, status post cholecystectomy, no evidence of      choledocholithiasis, diffuse fatty infiltration of the liver.  2.  Gastric-emptying study on May 08, 2005, showed markedly-delayed      gastric emptying with 95% activity remaining within the stomach at 120      minutes.  3.  Small bowel series, negative barium follow-through.   Last CT of abdomen/pelvis was done on April 11, 2005, prior to this most  recent hospitalization, at that time, showing no small bowel obstruction.  Also noted during previous hospitalization was that the patient underwent an  exploratory laparotomy with intended lysis of adhesions.  Only finding of  omental adhesions, none to small  bowel.  This was done again on April 17, 2005, by Dr. Excell Seltzer of general surgery for a question of small bowel  obstruction.   DISPOSITION:  The patient, at this time, is being transferred to [facility] under the direction of GI service previously discussed with Dr.  Steward Drone at [phone] for further evaluation of motility disorder  with presumed placement of gastric pacer, but defer management to their  facility and physicians.   CONDITION ON DISCHARGE:  Medically stable, but intolerant of p.o. with  continued severe pain, nausea, and six to eight loose bowel movements daily.   HOSPITAL COURSE:  Recurrent abdominal pain with nausea, vomiting, and  diarrhea.  The patient is a pleasant 55 year old woman who presented to the  emergency room the day of admission, for the fourth hospitalization of  recurrent abdominal pain with nausea, vomiting and diarrhea since April 05, 2005.  During previous hospitalizations, there has been  a question of small  bowel obstruction, even including exploration surgery for possible lysis of  adhesions last hospitalization.  The patient would seem to improve with  medical management, but then recurred with symptoms, prompting readmission.  During this hospitalization, she underwent further evaluation, overall  negative for any findings, except for severe gastroparesis.  The patient is  a diabetic and also on chronic large doses of narcotics for pain management  of her low back pain.  Various medical interventions have been tried,  including Zelnorm, erythromycin, and Reglan, none of which have improved the  patient's symptoms.  She has been held off her chronic MS Contin, which has  only caused exacerbation of her pain rather than improvement of her GI  symptoms.  Because of lack of response to medical management at our  facility, GI has discussed transfer with tertiary center at [facility] for  expertise in motility disorders, and the patient  has been accepted to the  service of GI, Dr. Newman Pies, for further evaluation and management, as the  patient is unable to be discharged home with her current symptoms and  intolerant of p.o.  At this time, th patient is hemodynamically stable,  attempting but not tolerating diabetic diet.  Medications are as listed  above.   PHYSICAL EXAMINATION AT THE TIME OF TRANSFER:  VITAL SIGNS:  Temperature  97.4, blood pressure 136/91, pulse 93, saturating 98% on room air, breathing  18-20 times per minute.  GENERAL:  She is an overweight, but pleasant and uncomfortable woman in no  extremis.  LUNGS:  Clear to auscultation.  CARDIOVASCULAR:  Regular rate and rhythm.  ABDOMEN:  Mildly obese, slightly distended, but soft and nontender, good  bowel sounds throughout, hyperactive.  EXTREMITIES:  Showed trace edema at the ankles.  NEUROLOGICAL:  She is awake, alert, oriented x4, without gross deficits.   MOST RECENT LABORATORY DATA:  Done on May 15, 2005, showing a sodium of  141, potassium 4.0, chloride 108, bicarbonate 29, glucose 134, BUN 4,  creatinine 0.7, total bilirubin 1.0, alkaline phosphatase 73, AST 83, ALT  68, total protein 5.1, albumin 2.5, calcium 8.7.  CBC done on May 15, 2005, showed a white count of 6.6, hemoglobin 11.8, platelet count 165,000.  Stool culture on May 06, 2005, negative, C. Difficile negative x3.  Ova  and parasites on May 06, 2005, negative.  Fecal white cells - none on  May 03, 2005.  Sed rate on May 04, 2005, was 20.  Urine drug screen on  May 03, 2005, negative for amphetamines, barbiturates, benzodiazepines,  cocaine and opiates.  Lipase on May 03, 2005, was within normal limits at  49.      Gwendolyn Grant, M.D. [facility]  Electronically Signed     VL/MEDQ  D:  05/15/2005  T:  05/15/2005  Job:  [id number]

## 2011-01-26 NOTE — Procedures (Signed)
[name], [name]               ACCOUNT NO.:  [id number]   MEDICAL RECORD NO.:  [id number]          PATIENT TYPE:  REC   LOCATION:  TPC                          FACILITY:  [facility]   PHYSICIAN:  Charlett Blake, M.D.DATE OF BIRTH:  1956-05-30   DATE OF PROCEDURE:  07/24/2004  DATE OF DISCHARGE:                                 OPERATIVE REPORT   REFERRING PHYSICIAN:  Teresa Pelton, M.D.   PROCEDURES:  Left L3 selective nerve root block.   INDICATIONS FOR PROCEDURE:  Left anterolateral leg pain with 10/10 pain  despite narcotic analgesics.   PERTINENT HISTORY:  L3-4 disk bulge.  Prior L3-4 translaminar epidural  sternoid injection.  Had headache associated with this.  Treated with  morphine and Phenergan, which dropped her blood pressure and was admitted to  the hospital.   DESCRIPTION OF PROCEDURE:  Informed consent was obtained after describing  the risks and benefits of the procedure to the patient.  These including  bleeding, bruising, infection, loss of bowel and bladder function and  temporary or permanent paralysis.  She elects to proceed and has given  written consent.   Patient placed prone on fluoroscopy table.  Betadine prep and sterile prep.  At 25 gauge 1-1/4 inch needle was used to anesthetize the skin and  subcutaneous tissues with 2 mL of 1% lidocaine.  Then a 22 gauge 3-1/2 inch  spinal needle was injected under fluoroscopic guidance on the pedicle of L3.  Omnipaque 180 x 0.5 mL injected showing some subpedicular flow.  No  intravascular uptake.  Then 1% methylparaben-free lidocaine x 2 mL was  injected after a negative drawback for blood.  She had some pressure down  the leg in her usual pain pattern during the injection.   Post injection vitals were monitored.  No adverse reaction.  She will be  given a pain diary and if she had at least a 50% improvement of pain, we  will go back and inject with steroids.      AEK/MEDQ  D:  07/24/2004 10:51:39  T:   07/24/2004 11:32:19  Job:  [id number]   cc:   Teresa Pelton, M.D.  [address] [place]  [place] [address]  Fax: [phone]

## 2011-01-26 NOTE — Assessment & Plan Note (Signed)
[name] is back regarding her chronic low back pain.  She has been doing a  bit better since I last saw her in June.  Pain will range from a  6 to an 8  out of 10.  The MS Contin generally provides a baseline control of her pain.  She describes her pain as sharp and constant.  She uses Vicodin for break  through symptoms.  Soma helps with muscle spasms.  She does use her TENS  unit with some success.  She does not do much in the way of exercise or  movement.  Bowels have been regulated with Colace.   REVIEW OF SYSTEMS:  The patient reports occasional bladder control problems,  spasms, depression, nausea, vomiting, diarrhea, abdominal pain and poor  appetite at times.  Full review is in the history section.   SOCIAL HISTORY:  Is without significant change today.   PHYSICAL EXAMINATION:  Blood pressure 109/70.  Pulse 73.  Respiration 16.  She is satting 98% on room air.  Patient is pleasant, in no acute distress.  She is alert and oriented x3.  Her affect is actually brighter than what it has been.  Gait is slightly  wide based and slow.  She arises slowly from her chair but appears to be in  less pain than last visit.  Reflexes are 2+.  Motor function is 5/5 in all 4 extremities except for a  pain inhibition at the proximal legs.  Sensations intact.  The low back remains very tender to both PSIS areas as well as lumbar facets  and paraspinals.  She has pain with movement in all planes today.  HEART:  Regular.  CHEST:  Clear.  ABDOMEN:  Soft, nontender.   ASSESSMENT:  1. Chronic low back pain related to lumbar facet disease and spondylosis.  2. Myofascial cervical pain with diskogenic component as well.  3. History of gastrointestinal dysmotility.  4. Anxiety/depression.  5. Obese.   PLAN:  1. We will change MS Contin to Avinza 90 mg q.8 hours.  2. Refill Vicodin 10/660 one q.8 hour p.r.n.,  #90.  3. Maintain Cymbalta.  4. Will refill Lidoderm patches 3 q. day to low back.  5.  Consider followup facet injections as the facet arthropathy is still      prominent on her lumbar magnetic resonance imaging.  6. I will see her back in 3 months time.  She will [facility] in      one month. We did talk about following her bowel habits closely to      avoid constipation.      Meredith Staggers, M.D.  Electronically Signed     ZTS/MedQ  D:  05/31/2006 13:10:57  T:  06/03/2006 14:14:16  Job #:  [id number]

## 2011-01-26 NOTE — Discharge Summary (Signed)
[name], FULL               ACCOUNT NO.:  [id number]   MEDICAL RECORD NO.:  [id number]          PATIENT TYPE:  INP   LOCATION:  53                         FACILITY:  [facility]   PHYSICIAN:  Darrick Penna. Swords, MD    DATE OF BIRTH:  11-28-1955   DATE OF ADMISSION:  06/20/2006  DATE OF DISCHARGE:  06/27/2006                                 DISCHARGE SUMMARY   DISCHARGE DIAGNOSES:  1. Nausea, vomiting and diarrhea, resolved.  2. Diabetic gastroparesis.  3. Diabetes.  4. Gastroesophageal reflux disease.  5. History of laparoscopic cholecystectomy.  6. Chronic pain syndrome, on narcotics.  7. Total abdominal hysterectomy.  8. Uterine cancer.  9. Status post external beam radiation for uterine cancer.  10.Hiatal hernia, status post Nissen fundoplication.   CURRENT MEDICATIONS:  1. MS Contin 30 mg t.i.d.  2. Cymbalta 60 mg daily.  3. Reglan 20 mg q.a.c.  4. Protonix 40 mg daily.  5. Metformin 500 mg p.o. daily.  6. Lantus 18 units subcu daily.   HOSPITAL PROCEDURES:  CT of the abdomen and pelvis:  CT of the abdomen  demonstrated a caliber change in the mid small intestine suggesting partial  small-bowel obstruction.  CT of the pelvis was negative.   Plain films of the coccyx normal by verbal report on June 27, 2006.   HOSPITAL LABORATORY DATA:  Fecal occult blood negative.  BMET on June 23, 2006 normal except for a glucose of 120.  Fecal lactoferrin was negative.  CBC on June 22, 2006 was normal.  Hemoglobin A1c on June 20, 2006 was  7.0.   CONSULTS:  General Surgery.  Diagnosis:  Coccydynia.   FOLLOWUP PLANS:  1. Billie D. Bean, FNP, this week (within 7 days).  2. Dr. Carlean Purl as scheduled, July 05, 2006.   HOSPITAL COURSE:  PROBLEM #1 - NAUSEA, VOMITING AND DIARRHEA:  The patient  was admitted to the hospitalist service on June 20, 2006.  Primary  diagnosis of nausea, vomiting and diarrhea was felt secondary to diabetic  gastroparesis.  Symptoms slowly  resolved with IV fluids.  No doubt,  gastroparesis is exacerbated by her chronic pain syndrome and MS Contin use.  It has been recommended to the patient that she try non-narcotics methods of  pain management and she is to discuss this as an outpatient.   PROBLEM #2 - DIABETES:  Patient with an A1c of 7.0.  We will continue Lantus  at her usual home dose.   PROBLEM #3 - COCCYDYNIA:  The patient complained of coccyx pain.  There is a  scar at that area, evaluated by General Surgery, who recommended no therapy.      Bruce Lemmie Evens Swords, MD  Electronically Signed     BHS/MEDQ  D:  06/27/2006  T:  06/27/2006  Job:  [id number]   cc:   Billie D. Maxie Better, [place]  [place], [place] [address]

## 2011-01-26 NOTE — H&P (Signed)
[name], [name]               ACCOUNT NO.:  [id number]   MEDICAL RECORD NO.:  [id number]          PATIENT TYPE:  INP   LOCATION:  0104                         [facility]ACILITY:  [facility]   PHYSICIAN:  Heinz Knuckles. Norins, M.D. [facility] OF BIRTH:  28-Dec-1955   DATE OF ADMISSION:  04/17/2006  DATE OF DISCHARGE:                                HISTORY & PHYSICAL   CHIEF COMPLAINT:  Nausea and vomiting.   HISTORY OF PRESENT ILLNESS:  [name] is a 55 year old married white  female mother of one with a longstanding history of nausea and vomiting, and  diarrhea.  She has been seen as an outpatient both at [facility] and  also by Dr. Carlean Purl.  Patient is followed by [name] at [facility].  Of note, the patient at one time had been a  candidate for gastric pacing but this was actually not pursued.  The patient  does not recall ever having gastric emptying study.  She has had a CT  abdomen and pelvis as well as other electromyographic studies.  The patient  reports her symptoms have been worse over the past week with 10-12 BMs  daily, emesis x8-10 daily.  She has been to the ER three times prior to  today's visit.  The patient has had no hematemesis, hematochezia, melena or  other signs of GI bleeding.  Patient is now admitted for IV fluids,  stabilization and further evaluation.   CURRENT MEDICATIONS:  1.  MS Contin 60 mg t.i.d.  2.  Vicodin 10/650 t.i.d.  3.  Reglan 5 mg a.c. and h.s.  4.  Altace 10 mg daily.  5.  Levemir 18 units nightly.  6.  Cymbalta 60 mg daily.   ALLERGIES:  CODEINE and TETANUS per the patient report.  She reports she had  an allergic reaction to SPINAL STEROID INJECTION.   PAST SURGICAL HISTORY:  1.  TAH.  2.  Laparoscopic cholecystectomy.  3.  Laparoscopic takedown of adhesions.  4.  Nissen fundoplication.  5.  Spinal injections.   MEDICAL ILLNESSES:  1.  Usual childhood disease.  2.  G1, P1  3.  Uterine CA status post  hysterectomy with external beam radiation      therapy.  4.  Hiatal hernia.  5.  GERD.  6.  Diabetes, now on basal insulin therapy.  7.  Hyperlipidemia.  8.  Chronic renal insufficiency with microalbuminuria.  9.  Degenerative disk disease low back with chronic pain.   HABITS:  Tobacco:  None.  Alcohol:  None.   FAMILY HISTORY:  Positive for diabetes in her mother.  Secondary kinship  positive for heart disease.  Positive for breast cancer in her mother.  Positive for colon cancer in paternal uncles.  Positive for hypertension in  her father.   REVIEW OF SYSTEMS:  Negative for headaches or other constitutional symptoms,  negative for cardiovascular or respiratory problems.  Positive for  psychosocial issues with the patient reporting she is depressed dealing with  an unemployed alcoholic husband.   SOCIAL HISTORY:  The patient was married  for less than 1 year, divorced,  single for approximately 20 years and has been married for two and a half  years.  She has a 87 year old son.  She has a 107 year old stepson.  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  She has no  history of physical or sexual abuse.   PHYSICAL EXAMINATION:  VITAL SIGNS:  Temperature 98.2, blood pressure  124/76, heart rate 99, respirations 16.  GENERAL APPEARANCE:  Well-nourished, well-developed white female in no acute  distress.  HEENT:  Normocephalic, atraumatic.  EACs and TMs were normal.  Oropharynx  with native dentition, no buccal or palatal lesions were noted.  Posterior  pharynx was clear.  Conjunctivae and sclerae was clear.  Pupils equal,  round, react to light and accommodation.  Was unable to perform funduscopic  exam secondary to poor conditions and equipment.  NECK:  Supple without thyromegaly.  NODES:  No adenopathy was noted in the cervical, supraclavicular, axillary,  inguinal regions.  CHEST:  No CVA tenderness.  LUNGS:  Clear to auscultation and percussion.  BREASTS:   The patient has flat, almost inverted nipples.  Skin was otherwise  normal.  No fixed mass, lesion or abnormality was noted.  CARDIOVASCULAR:  2+ radial pulse, no JVD, no carotid bruit.  She had a  regular rate and rhythm without murmurs, rubs or gallops.  ABDOMEN:  Soft.  No guarding or rebound.  There was no organosplenomegaly.  No masses were appreciated.  The patient had diffuse tenderness most likely  secondary to nausea and vomiting.  PELVIC:  Exam deferred.  EXTREMITIES:  Without clubbing, cyanosis, edema or deformity.  NEUROLOGIC:  Exam was nonfocal.  SKIN:  The patient has what appears to be an erythematous macular rash above  her right eye which is consistent with eczema.   LAB WORK:  Hemoglobin 14 g, white count was 8600 with a normal differential,  platelets 201,000.  Chemistries with sodium 143, potassium 3.3, chloride  108, CO2 27, BUN 7 , creatinine 0.8, glucose 136, AST elevated at 86, ALT  elevated at 84, total bilirubin minimally elevated at 1.5, lipase at one  point elevated at 52.   ASSESSMENT/PLAN:  1.  Gastrointestinal:  Patient with chronic nausea and vomiting.  She has a      slight bump in her liver function tests.  The patient has had full      evaluation by GI in the past with no definitive etiology determined.      Plan:  Will get a chronic hepatitis screen given the patient's bump in      her liver enzymes.  We will get a gastric emptying study.  Will give the      patient IV Phenergan for nausea.  Will give her IV fluids to rehydrate.  2.  Diabetes:  The patient will have a hemoglobin A1c added to her labs.      Will continue with Levemir and a low carb diet.  3.  Pain management:  Patient has chronic pain management issues.  She is      followed by Wyatt Mage.  I have no other names in terms of prescribing      source.  The patient has no exacerbation of her back pain at this time.     Plan:  The patient will continue her home medications.            ______________________________  Heinz Knuckles Norins, M.D. [facility]  MEN/MEDQ  D:  04/17/2006  T:  04/17/2006  Job:  [id number]   cc:   [place]

## 2011-01-26 NOTE — H&P (Signed)
[name], [name]               ACCOUNT NO.:  [id number]   MEDICAL RECORD NO.:  [id number]          PATIENT TYPE:  INP   LOCATION:  [place]                         FACILITY:  [facility]   PHYSICIAN:  Iona Beard L. Andree Elk, MD    DATE OF BIRTH:  Nov 22, 1955   DATE OF ADMISSION:  06/20/2006  DATE OF DISCHARGE:                                HISTORY & PHYSICAL   CHIEF COMPLAINT:  Nausea, vomiting, diarrhea.   PRIMARY CARE DOCTOR:  Wyatt Mage, [place], [facility].   HISTORY OF PRESENT ILLNESS:  A 55 year old white female with history of  gastroparesis, multiple hospitalizations for nausea and vomiting, who  presents with the current symptoms.  The patient's last admit was May 25, 2004.  States 4 days ago started getting diarrhea, cold sweats and pain  in her stomach, sharp in nature, progressed to vomiting.  No fevers or  chills.  Positive night sweats.  No chest pain or shortness of breath.  No  problems with urination.  The patient states same symptoms as previous  gastroparesis admissions.   ALLERGIES:  CODEINE, STEROID INJECTIONS, TETANUS.   MEDICATIONS:  1. Hydrocodone p.r.n.  2. __________  90 q.8 h.  3. Cymbalta 60 daily.  4. Metformin 500 twice a day.  5. Reglan 20 with meals.  6. Promethazine 25 nightly p.r.n.   PAST MEDICAL HISTORY:  1. Diabetes.  2. Chronic nausea and vomiting secondary to gastroparesis.  3. Total abdominal hysterectomy secondary to uterine cancer, status post      external beam radiation.  4. Hiatal hernia, status post Nissen fundoplication.  5. Gastroesophageal reflux disease.  6. Laparoscopic cholecystectomy.  7. Chronic pain syndrome.   SOCIAL HISTORY:  Lives in [place] with her husband.  Does not smoke.  Mild alcohol.   FAMILY HISTORY:  Mother diabetes and breast cancer.  Secondary relatives  with CAD.   REVIEW OF SYSTEMS:  Positive for GI-wise, nausea and vomiting and diarrhea.  Otherwise all other symptoms are negative.   PHYSICAL  EXAMINATION:  Temperature 97.8, pulse 90, respiratory rate 20,  blood pressure 146/84.  GENERAL:  Alert and oriented x3, in no apparent distress.  HEENT:  Pupils equal, round and reactive to light.  Extraocular movements  intact.  No lymphadenopathy.  No carotid bruits.  CHEST:  Clear to auscultation bilaterally.  CARDIOVASCULAR:  A systolic ejection murmur in right upper sternal border.  Normal S1 S2.  ABDOMEN:  Soft.  Tender to palpation especially over the left upper  quadrant.  Nondistended.  High-pitched bowel sounds.  EXTREMITIES:  No clubbing, cyanosis or edema.  Strong pulses.  NEUROLOGIC:  Alert and oriented x3.  Cranial nerves II through XII are  intact.   Chest x-ray showing small bowel obstruction with air-fluid levels, no acute  cardiopulmonary process.  Laboratory-wise, white count 10.6, H&H 15 and 44,  platelets 249.  Sodium 139, potassium 3.4, BUN 12, creatinine 0.8, glucose  259.  LFTs slightly elevated with an AST of 44, ALT of 64, total bilirubin  1.6, alk phos 116, total protein 7.3, albumin 3.8, calcium 9.7.   ASSESSMENT:  1. The first issue we are addressing is gastroparesis/ileus.  Antiemetics      and IV hydration.  Send stool for white blood cell count.  2. Chronic pain.  Intravenous morphine p.r.n.  3. Diabetes mellitus.  Sliding scale insulin.  Check hemoglobin A1c.  4. Gastroesophageal reflux disease.  Intravenous ranitidine.  5. Deep venous thrombosis prophylaxis.  Subcutaneous heparin.  6. Hypokalemia.  Replace potassium with intravenous fluids.  7. Full code.           ______________________________  Caroline Sauger. Andree Elk, MD     GLA/MEDQ  D:  06/20/2006  T:  06/21/2006  Job:  [id number]

## 2011-01-26 NOTE — Procedures (Signed)
NAMEALYS, [name]               ACCOUNT NO.:  [id number]   MEDICAL RECORD NO.:  [id number]          PATIENT TYPE:  REC   LOCATION:  [facility]                          FACILITY:  [facility]   PHYSICIAN:  Charlett Blake, M.D.DATE OF BIRTH:  1956/09/09   DATE OF PROCEDURE:  10/31/2004  DATE OF DISCHARGE:                                 OPERATIVE REPORT   DATE OF BIRTH:  1956-03-13.   PROCEDURE:  Bilateral L4-5, L5-S1 facet blocks under fluoroscopic guidance.   INDICATIONS:  Partial relief from previous two facet denervations.  Was  scheduled for RF; however, given only a 20% reduction after the last  injection, will try a third.   Informed consent was obtained after describing the risks and benefits to the  patient.  These included bleeding, bruising, infection, loss of bowel and  bladder functioning, either temporary or permanent paralysis.  She elects to  proceed.   The patient placed prone on the fluoroscopy table, Betadine prep and sterile  drape.  A 25-gauge 1-1/4 inch needle was used to anesthetize the skin and  subcu tissue.  Ten milliliters of lidocaine were mixed with 1 mL of 8.4%  sodium bicarb to buffer the solution, and 1.5 mL were injected into each of  six sites.  A 22-gauge, 3-1/2 inch spinal needle was inserted, first at the  left S1 sacral ala junction.  Bone contact was made.  Omnipaque 180 x 0.3 mL  were injected, demonstrating no intravascular uptake.  Then a solution  containing 1 mL of 40 mg/mL Kenalog plus 2 mL of 2% lidocaine were injected  x0.5 mL.  The same procedure was repeated on the right side at the right S1  SAP-sacral ala junction using the same procedure.  Then we moved the C-arm  toward the right 15 degrees to the right L5 SAP-transverse process junction,  once again using a 22-gauge, 3-1/2 inch spinal needle.  Bone contact was  made and Omnipaque 180 x0.3 mL injected, showing no intravascular uptake on  live fluoroscopy.  Then 0.5 mL of  Kenalog-lidocaine solution was injected.  Then the same procedure was repeated at the right L4 SAP-transverse process  junction.  Once again a 22-gauge, 3-1/2 inch spinal needle was inserted,  bone contact, Omnipaque 180 x0.3 mL showing no intravascular uptake, and 0.5  mL of Kenalog-lidocaine solution was injected.  Then the C-arm was obliqued  toward the left side and the left L4 SAP-transverse process junction was  targeted.  Once again a 22-gauge 3-1/2 inch spinal needle was inserted, bone  contact, Omnipaque 180 demonstrated no intravascular uptake with 0.3 mL, and  then 0.5 mL Kenalog-lidocaine solution injected.  Finally the left L5 SAP-  transverse process junction was targeted.  A 22-gauge, 3-1/2 inch spinal  needle was inserted with bone contact, Omnipaque 180 demonstrating no  intravascular uptake on live fluoroscopic injection, then 0.5 mL of Kenalog-  lidocaine solution was injected.  The patient tolerated the procedure well.   POSTINJECTION INSTRUCTIONS:  She is to fill out a pain score, bring it to  Dr. Naaman Plummer.  If she  has a greater than 50% relief lasting at least a couple  of hours, would then reconsider RF.  Otherwise, only other injection  procedure that I would contemplate would be a sacroiliac injection.      AEK/MEDQ  D:  10/31/2004 10:36:13  T:  10/31/2004 11:18:45  Job:  [id number]

## 2011-01-26 NOTE — Procedures (Signed)
[name], [name]               ACCOUNT NO.:  [id number]   MEDICAL RECORD NO.:  [id number]          PATIENT TYPE:  EMS   LOCATION:  [place]                         FACILITY:  [facility]   PHYSICIAN:  Charlett Blake, M.D.DATE OF BIRTH:  31-Jan-1956   DATE OF PROCEDURE:  DATE OF DISCHARGE:  01/08/2005                                 OPERATIVE REPORT   DATE OF SERVICE:  Jan 22, 2005.   MEDICAL RECORD NUMBER:  [id number].   PROCEDURE:  Right paramedian L5-S1 epidural steroid injection under  fluoroscopic guidance.   INDICATIONS:  Low back pain only partially responsive to narcotic analgesic  medications.  Also, physical therapy tried.   The risks and benefits of the procedure explained to the patient and these  include bleeding, bruising, infection, loss of bowel and bladder function,  temporary or permanent paralysis, death, or dural tear headache requiring a  blood patch.  She consents to the procedure.   PROCEDURE:  The patient was placed prone on the fluoroscopy table and  Betadine prepped and sterile drape.  A 25-gauge inch-and-a-half needle was  used to anesthetize the skin and subcu tissue with 1% lidocaine.  Then, an  18 gauge Touhy needle was inserted under fluoroscopic guidance using both PA  and lateral imaging.  A blood return was obtained.  This was posterior to  the posterior elements on the left side.  No injection was done and  therefore the procedure was switched to the right paramedian approach.  Once  again, the skin and subcu infiltrated with lidocaine and then under sterile  procedure an 18 gauge Tuohy needle inserted into the L5-S1 translaminar  space.  AP and lateral images confirmed proper location.  Loss of resistance  technique utilized.  Then, Omnipaque-180 x1 mL injected under live Fluoro  using IV extension tubing, and a good epidural spread was noted.  Then, a  solution containing 2 mL of 40 mg/mL Kenalog plus 2 mL of 1% methylphenidate  via lidocaine  were injected.  The patient tolerated the procedure well.  Pre-  injection pain level 10.  Post-injection pain level 0.  She ambulated out  after monitoring.      AEK/MEDQ  D:  01/22/2005 16:24:28  T:  01/22/2005 23:09:18  Job:  [id number]

## 2011-01-26 NOTE — Assessment & Plan Note (Signed)
DATE OF BIRTH:  Dec 15, 1955   MEDICAL RECORD NUMBER:  [id number]   HISTORY:  Patient follows up today after I last saw her July 24, 2004  for a left L3 selective nerve root block under fluoroscopic guidance.  She  had no relief even in the short term for this.  She still notes that her  pain is mainly on the left side.  She has pain into her left buttock down to  her posterior thigh.  She rates that pain as 8-10/10.  She feels some  numbness in her feet and hands but not in thigh.   REVIEW OF SYSTEMS:  Positive for poor sleep, anxiety, does not think the  Topamax has helped with her sleep, has some reflux/heartburn as well as  diarrhea.   EXAMINATION:  Gait is with a limp.  Affect is bright/alert.  Blood pressure  121/63, pulse 100, respiratory rate 16, O2 saturation 98% room air.  She has  4+ strength in the left lower extremity hip flexor/knee extensor and 5 on  the right side.  She has some pain inhibition in the left lower.  She is  able to bend forward about 40 degrees but backwards she gets to about  neutral and this reproduces her pain on the left side of the back into her  left buttock and posterior thigh.   IMPRESSION:  Low back as well as hip and thigh pain.  Physical exam  consistent with facet arthropathy.  This is bolstered by no relief from L3  selective nerve root block.   PLAN:  We will schedule for facet injection in medial branch blocks of the  L4-5 and L5-S1 levels.       AEK/MedQ  D:  08/14/2004 09:48:55  T:  08/14/2004 18:22:58  Job #:  [id number]   cc:   Teresa Pelton, M.D.  [place]. [place]  [place] [address]  Fax: [phone]

## 2011-01-26 NOTE — Discharge Summary (Signed)
[name], [name]               ACCOUNT NO.:  [id number]   MEDICAL RECORD NO.:  [id number]          PATIENT TYPE:  INP   LOCATION:  5409                         [facility]ACILITY:  [facility]   PHYSICIAN:  Gwendolyn Grant, M.D. LHCDATE OF BIRTH:  06-14-1956   DATE OF ADMISSION:  04/14/2005  DATE OF DISCHARGE:  04/25/2005                                 DISCHARGE SUMMARY   DISCHARGE DIAGNOSES:  1.  Abdominal pain with questioned functional small-bowel obstruction,      resolved.  2.  Probable gastroparesis related to diabetic neuropathy and/or narcotics.      Continue Reglan.  3.  Chronic low back pain with debilitation per home health physical therapy      and followup with pain clinic, Dr. Letta Pate for scheduled injections.  4.  Type 2 diabetes, initiated oral hypoglycemics this admission.      Outpatient followup with primary doctor and nutritional educational      consults.  5.  Depression.  Continue Wellbutrin plus Lexapro.   CONSULTATIONS:  General surgery with [facility].   PROCEDURE:  Exploratory laparoscopy on August 8, with negative adhesions to  small bowel, positive adhesions to omentum.   CONDITION ON DISCHARGE:  Mildly debilitated, but tolerating p.o., nutrition  and medications.  No nausea, vomiting, positive bowel movements.  Understands plans for discharge instruction, medications and followup as  well as home health.   HOSPITAL COURSE:  Problem 1.  RECURRENT ABDOMINAL PAIN:  The patient is a 55-  year-old woman who was hospitalized for approximately 1 week at [facility]  and discharged August 4, to return to [facility] Emergency Room on August  5, complaining of increased nausea, vomiting and abdominal pain with  distension since return home.  Plain x-rays in the ER again showed partial  small bowel obstruction and so she was admitted to the hospital for further  evaluation.  General surgery was again consulted on August 6, as they had  been a [facility] who  agreed to clinically monitor the patient, but as the  patient had little change in her overall status on August 8, was taken for  an exploratory laparoscopy and probable lysis of adhesions with surprisingly  no adhesions found to the small-bowel only to the omentum.  Dr. Excell Seltzer of  surgery felt that there was conceivable twisting of small bowel around  omental adhesions, but no evidence of obstruction that could be found and  thus felt that her problems of recurrent nature were more likely functional,  not mechanical.  With patient's history of diabetes and probable  gastroparesis also exacerbated by chronic narcotics taken for low back pain,  the patient was continued on a higher dose of Reglan and slowly improved to  her normal status.  At the time of discharge, she has been tolerating her  diet without significant pain, nausea or vomiting for over 48 hours and is  felt ready for discharge home.  Continue Reglan as well as maintenance of  diabetic control and stool softeners for continued bowel movements.   Problem 2.  CHRONIC PAIN WITH DEPRESSION:  The patient's  depression  medications were changed from Cymbalta over to Lexapro and Wellbutrin during  this hospitalization.  She has tolerated this well.  She is otherwise  continue her MS Contin and Neurontin as prior to admission as well as  Relafen.  Hospital followup in pain clinic physician, Dr. Letta Pate, to  reschedule injections for her chronic low back issues.  Otherwise, issue is  stable.   Problem 3.  TYPE 2 DIABETES:  The patient was previously considered diet-  controlled, but her CBGs ranged easily in the 160s to 180s since  hospitalization.  Therefore, she was begun on Amaryl in the mornings which  has  helped improve her sugars to the 120s. Continued outpatient followup  including diabetic education will be per primary doctor.  Consider gastric  emptying study to further evaluate effects of gastroparesis and continue   Reglan as stated above.      Gwendolyn Grant, M.D. [facility]  Electronically Signed     VL/MEDQ  D:  04/25/2005  T:  04/25/2005  Job:  [id number]

## 2011-01-26 NOTE — Assessment & Plan Note (Signed)
The patient returns today for medial branch blocks.  Her husband is with  her.  He would like to discuss her pain medicines.  She has been  hospitalized for what sounds like a bowel obstruction and this has been a  recurrent problem, and her general medical physicians have requested they  talk to the pain center regarding pain medication and the limitation of  opiates.  The patient ran out of morphine two days ago per patient and  husband's notification.  Denied any withdrawal symptoms.   She is complaining of both neck pain and low back pain.  She is saying the  neck pain is more with looking upward.   PHYSICAL EXAMINATION:  GENERAL:  No acute distress, mood and affect  appropriate.  VITAL SIGNS:  Blood pressure 134/53, pulse 117, respiratory rate 16, O2  saturation 99%.  Pain level 9/10.  MUSCULOSKELETAL/NEUROLOGIC:  She has pain with extending her neck more than  flexing her neck, but both these motions do hurt, and they are about 50% of  the normal range.   Her gait is normal.  No evidence of antalgia or ataxia.   IMPRESSION:  History of lumbar facet syndrome.  She has had at least two  medial branch blocks that have resulted in pain relief and one that has not.  She states the last one was very effective.  Will repeat today.  In terms of  her neck pain, I asked her to follow up with Dr. Naaman Plummer for overall re-look  at her medications given her recent history and the fact that she is already  off her morphine, had just institute some Ultram 100 mg t.i.d.  T minimize  the effect of serotonin syndrome, have reduced her Cymbalta to 30 mg.  She  will follow up with Dr. Naaman Plummer, who will take a more comprehensive look at  her medications and decide as well as on further workup assessment of her  neck pain, i.e., MRI versus referral for medial branch block of the cervical  spine.      Charlett Blake, M.D.  Electronically Signed     AEK/MedQ  D:  10/01/2005 12:41:10  T:   10/02/2005 06:42:54  Job #:  [id number]   cc:   Meredith Staggers, M.D.  Fax: [phone]

## 2011-01-26 NOTE — H&P (Signed)
[name], [name]               ACCOUNT NO.:  [id number]   MEDICAL RECORD NO.:  [id number]          PATIENT TYPE:  EMS   LOCATION:  ED                           FACILITY:  [facility]   PHYSICIAN:  Drema Pry, D.O. [facility]   DATE OF BIRTH:  07/16/1956   DATE OF ADMISSION:  05/02/2005  DATE OF DISCHARGE:                                HISTORY & PHYSICAL   CHIEF COMPLAINT:  Diarrhea, nausea, and vomiting.   HISTORY OF PRESENT ILLNESS:  The patient is a 55 year old white female with  a past medical history of diabetes with multiple recent hospitalizations for  nausea and vomiting, who returns to the ER with relapse of symptoms.  The  patient was discharged on April 24, 2005, and states that she was doing  fairly well until 2 days before presentation when she started with profuse  diarrhea, approximately 20-30 bowel movements per day, and then the  following day symptoms of nausea and vomiting.  The patient also complains  of mild mid-abdominal pain, and symptoms are similar to previous episode.  Denies any hematemesis.  No blood in the stools.  The patient describes  bowel movements as watery and somewhat foul-smelling.  No other associated  symptoms.  No fever, no chest pain, no shortness of breath.  All other  systems negative.   PAST MEDICAL HISTORY:  1.  Obesity.  2.  Depression.  3.  History of fatty liver.  4.  Type 2 diabetes.  5.  Chronic constipation.  6.  Chronic low back pain with chronic narcotic use.   PAST SURGICAL HISTORY:  Underwent exploratory laparoscopy with Dr. Excell Seltzer,  which revealed negative adhesions, and he felt the patient's ileus/partial  small bowel obstruction was more functional.   SOCIAL HISTORY:  No tobacco, no alcohol.  Lives at home with 2 sons and her  husband.   FAMILY HISTORY:  Noncontributory.   REVIEW OF SYSTEMS:  As above.  All other systems were negative.   LABORATORY DATA:  CBC showed WBC of 10.0, hemoglobin and hematocrit of 15.0  and  44.5, platelets of 275.  Complete metabolic profile showed a sodium of  138, potassium 3.1, chloride 105, CO2 of 22, BUN of 5, creatinine 0.7, blood  sugar at 192, calcium 9.2.  Transaminases were mildly elevated at AST 120,  ALT 104, alkaline phosphatase 107, lipase 49.   Acute abdominal series was performed in the ER which showed distended  stomach with mildly distended small bowel loops.   CURRENT MEDICATIONS:  1.  Lexapro 10 mg once a day.  2.  Wellbutrin XL 150 mg once a day.  3.  MS Contin 60 mg t.i.d.  4.  Reglan 5-10 mg p.o. t.i.d.  5.  Zelnorm 6 mg b.i.d.  6.  Neurontin 300 mg t.i.d.   ALLERGIES:  1.  CODEINE.  2.  Previous allergies listed for STEROIDS.   PHYSICAL EXAMINATION:  VITAL SIGNS:  Temperature 98.1, pulse 120, blood  pressure 138/95, respirations 22, 98% on room air.  GENERAL:  The patient is an obese 56 year old female, somewhat pale.  HEENT:  Normocephalic  and atraumatic.  Extraocular movements were intact.  Pupils equal, round and reactive to light bilaterally.  Mucous membranes are  moist.  No oral lesions were identified.  NECK:  Supple.  No adenopathy, carotid bruit, or thyromegaly.  LUNGS:  Chest was clear to auscultation bilaterally.  No rhonchi, rubs, or  wheezing.  Normal inspiratory and expiratory effort.  CARDIOVASCULAR:  Tachycardic, regular rate.  No significant murmurs, rubs,  or gallops appreciated.  ABDOMEN:  Soft.  The patient had positive mid-abdominal tenderness.  No  masses were noted.  Hyperactive bowel sounds.  No rebound or guarding.  EXTREMITIES:  No clubbing, cyanosis, or edema.  NEUROLOGIC:  Cranial nerves II-XII grossly intact.  No focal deficits.   ASSESSMENT AND PLAN:  1.  Intractable nausea and vomiting.  Ileus versus partial small bowel      obstruction.  2.  Diabetes.  3.  History of chronic low back with chronic narcotic use.  4.  Hypokalemia.  5.  Type 2 diabetes with possible gastroparesis.  6.  Depression.  7.   History of fatty liver with elevated transaminases.   RECOMMENDATIONS:  The etiology of the patient's GI symptoms were unclear  with recent exploratory laparoscopy and findings that did not support a  partial or any type of small bowel obstruction.  I suspect that there is a  functional reason for her GI symptoms.  Although the patient states that she  has been taking her medications, including pain medications, on a consistent  basis, I wonder if any of her symptoms are due to withdrawal.  I will check  a UDS.  In respect to her diarrhea, I will send stool studies for culture,  WBC's, and a Clostridium difficile, and empirically start Flagyl IV, for she  cannot tolerate p.o. Flagyl at this time.  In addition, I discussed this  case with Dr.  Henrene Pastor, who was on call tonight for  GI, and they will evaluate the  patient in the morning.  Other possible considerations are biliary  dyskinesia versus actually documenting significant gastroparesis by ordering  a gastric emptying study.  The patient will be given Dilaudid IV for pain  control and Zofran for her nausea.      Drema Pry, D.O. [facility]  Electronically Signed     RY/MEDQ  D:  05/03/2005  T:  05/03/2005  Job:  [id number]   cc:   Aaron Edelman, N.P.  [place]

## 2011-01-26 NOTE — Procedures (Signed)
[name], [name]               ACCOUNT NO.:  [id number]   MEDICAL RECORD NO.:  [id number]          PATIENT TYPE:  REC   LOCATION:  [facility]                          FACILITY:  [facility]   PHYSICIAN:  Charlett Blake, M.D.DATE OF BIRTH:  Aug 30, 1956   DATE OF PROCEDURE:  DATE OF DISCHARGE:                                 OPERATIVE REPORT   MEDICAL RECORD NUMBER:  [id number]   DATE OF BIRTH:  05-13-1956   PROCEDURE:  Left lumbar radiofrequency neurotomy under fluoroscopic  guidance.   INDICATION:  Response to medial branch blocks x2 in facet arthropathy with  only limited response to narcotic analgesics and other conservative care.   INFORMED CONSENT:  Informed consent was obtained after describing the risks  and benefits of the procedure to the patient; these include bleeding,  bruising, infection, loss of bowel and bladder function, temporary or  permanent paralysis; she elects to  proceed and has given written consent.   DESCRIPTION OF PROCEDURE:  The patient was placed prone on fluoroscopy table  with Betadine prep and sterile drapes.  A 25-gauge inch-and-a-half needle  was used to anesthetize the skin and subcu tissues with 1% lidocaine, 3 mL  at each of 3 sites.  Then a 22-gauge 10-cm RF needle with a 10-cm active  curved tip was inserted under fluoroscopic guidance, first targeting the  left S1-SAP sacral ala junction, bone contact made, confirmed with lateral  imaging.  Motor stimulation, 3 volts, demonstrated only local twitch  response and then 1 mL of a solution containing 40 mg/mL of Depo-Medrol x1  mL and 2 mL of 1% methylparaben-free lidocaine were injected.  This was  followed by RF lesioning, 70 degrees x70 seconds.  Next, the left L4-SAP  transverse process junction was targeted, bone contact made, confirmed with  lateral imaging.  Motor stimulation, 3 volts, demonstrated local paraspinal  twitch and then 1 mL of the Depo-Medrol/lidocaine solution was injected.  This was followed by radiofrequency lesioning, 70 degrees x70 seconds, and  last, the left L4-SAP transverse process junction targeted, bone contact  made, confirmed with lateral imaging.  Motor stimulation x3 volts  demonstrated only localized twitch and then 1 mL of the Depo-  Medrol/lidocaine solution was injected.  This was followed by a  radiofrequency lesioning, 70 degrees x70 seconds.  The patient tolerated the  procedure well.   Post-injection instructions given.  Follow up in 5 weeks for right lumbar  RF.      Charlett Blake, M.D.  Electronically Signed    AEK/MEDQ  D:  11/22/2005 14:04:07  T:  11/24/2005 04:25:58  Job:  [id number]

## 2011-01-26 NOTE — Op Note (Signed)
NAMEZIGGY, REVELES               ACCOUNT NO.:  [id number]   MEDICAL RECORD NO.:  [id number]          PATIENT TYPE:  INP   LOCATION:  [place]                         FACILITY:  [facility]   PHYSICIAN:  Marland Kitchen T. Hoxworth, M.D.DATE OF BIRTH:  1956-02-25   DATE OF PROCEDURE:  04/17/2005  DATE OF DISCHARGE:                                 OPERATIVE REPORT   PREOPERATIVE DIAGNOSIS:  Recurrent small bowel obstruction.   POSTOPERATIVE DIAGNOSIS:  Omental adhesions only, small-bowel normal.   SURGICAL PROCEDURE:  Laparoscopic lysis of adhesions for small bowel  obstruction.   BRIEF HISTORY:  Ms. Seguin is a 55 year old female who since spring of this  year has had several ER visits and admissions for recurrent abdominal pain.  She was discharged from [facility] last week and then readmitted to  [facility] with recurrent symptoms. Abdominal x-rays have repeatedly shown  dilated proximal small bowel and gastric dilatation consistent with small-  bowel obstruction. In each case, she has improved with NG insertion. She  continues this hospitalization to have some pain despite normalization of  her x-rays. Due to repeated episodes and normal extensive radiologic workup,  she is felt to likely have a recurrent small bowel obstruction secondary to  adhesions. Because of the repeated nature of her problem, we offered  laparoscopic lysis of adhesions for diagnosis and treatment, and she  concurs. The nature of the procedure, its indications, risks of bleeding,  infection, bowel injury, possible need for open procedure, and possible  failure to relieve her symptoms have been discussed and understood  preoperatively. She is now brought to the operating room for this procedure.  Of note, her previous surgery includes hysterectomy through a Pfannenstiel  incision, laparoscopic cholecystectomy and laparoscopic Nissen  fundoplication.   DESCRIPTION OF PROCEDURE:  The patient brought to the  operating room and  placed supine position on the operating table, and general orotracheal  anesthesia was induced. The abdomen was widely sterilely prepped and draped.  Foley catheter was placed. PAS were placed. Preoperative antibiotics were  given. Correct patient and procedure were verified. Access was obtained  through a 1-cm incision at the umbilicus, dissecting down through the fascia  for 1 cm and entering the peritoneum under direct vision. Two mattress  sutures of 0 Vicryl, the Hasson trocar was placed, and pneumoperitoneum  established. Laparoscopy revealed some fairly extensive omental adhesions  anterior abdominal wall around the umbilicus and the pelvis. The right  lateral abdominal wall was clear, and two 5-mm trocars were placed. Using  harmonic scalpel, omental adhesions were then taken down from the anterior  abdominal wall from her previous Pfannenstiel incision, from the  periumbilical area, and from the left upper quadrant. These were all omental  adhesions without any small bowel involvement. At this point, the omentum  was completely free and could be elevated up into the upper abdomen, and the  transverse colon and mesocolon were lifted and the ligament of Treitz  identified. The small bowel at this point at the ligament Treitz was  nondilated and appeared normal. I then carefully traced the small bowel  from  ligament of Treitz to the ileocecal valve and cecum, examining all the small  bowel carefully. There were absolutely no adhesions to any point in the  small bowel. In the mid jejunum, there was some mildly dilated bowel but not  clearly pathologic, and again no adhesions or any evidence of mechanical  obstruction ongoing. The colon was examined and appeared normal. There were  some adhesions as expected of the left lobe of liver down around the hiatus,  but the majority the stomach could be seen and appeared normal. There were  adhesions from her previous  cholecystectomy onto the liver but again not  involving any of her small bowel. At this point, we clearly ruled out any  obstructing small bowel adhesions. Omentum was placed back over the bowel,  and the trocars removed under direct vision, and all CO2 evacuated.  Mattress suture was secured at the umbilicus. Skin incisions were closed  with interrupted subcuticular 4-0 Vicryl and Steri-Strips. Sponge, needle  and instrument counts were correct. Dressings were applied, and patient  taken to recovery in good condition.      Darene Lamer. Hoxworth, M.D.  Electronically Signed     BTH/MEDQ  D:  04/17/2005  T:  04/18/2005  Job:  [id number]

## 2011-01-26 NOTE — Group Therapy Note (Signed)
MEDICAL RECORD NUMBER:  [id number]   CHIEF COMPLAINT:  Low back pain.   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  She has had back pain since  that time in the left lower back region.  She was seen by an orthopedist in  [place] who gave her an epidural shot which apparently sent her into an  arrhythmia which required hospitalization.  She may have had a myocardial  infarction.  The patient received physical therapy on two separate occasions  and also had a neurosurgical evaluation for her pain, which revealed that  she was not a surgical candidate.  There was mention of an L3-L4 herniated  disk, which I have no exact record of.   Eventually the patient was seen by Dr. Elta Guadeloupe L. Hardin Negus here in town in  March of this year, who found no evidence of organic back pain and  recommended followup by her PCP.  An MRI performed in March of this year  revealed normal soft tissue structure.  There were minimal signs of disk  desiccation in the lumbar region.  There was no focal disk herniation seen,  nor was there any central canal or neural foraminal stenosis noted.  The  study was read as negative.   The patient reports that her pain is at a 7/10 on average.  It will range  from a 5-6/10 when she uses her Vicodin, and will range from 8-9/10 if she  does not take the medication.  She uses Vicodin 10/660 mg, three to four  times daily.  She also used a Lidoderm patch, two patches daily.  She notes  the pain is worse with weather and also with walking, sitting, or bending.  She occasionally has a radiation of pain into her buttocks, which she states  happens about 20% of the time.  She denies weakness or numbness in the legs.  She has had no spasms or bowel or bladder dysfunction.  The patient finds  some relief with heat.  She has had therapy  twice in 2003, and once again in  2004.  She recalls taking some anti-inflammatories in the past, including  ibuprofen, Naproxen, Celebrex, Vioxx and Bextra.   PAST MEDICAL/SURGICAL HISTORY:  1. Significant for diet-controlled diabetes, which is still being worked up.  2. She has also had a hiatal hernia.  3. She has had gallbladder surgery.  4. A hysterectomy.  5. She had a Nissen fundoplication for her hiatal hernia.   CURRENT MEDICATIONS:  1. Vicodin 10/660 mg, one q.6h. p.r.n.  2. Lidoderm patch 5%, two daily.   ALLERGIES:  STEROIDS AND TETANUS.   SOCIAL/FUNCTIONAL HISTORY:  The patient is married.  She does not smoke or  drink.  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  She spends about eight hours a day on the phone.   FAMILY HISTORY:  Negative for high blood pressure, cancer and diabetes.   REVIEW OF SYSTEMS:  The patient denies any chest pain, shortness of breath,  cold, flu, coughing or wheezing symptoms.  She denies seizures, weakness,  dizziness, spasms, confusion, problems with vision, anxiety, suicidal  thoughts, hearing, or taste.  She has had no headaches.  She denies nausea  or vomiting.  She does have occasional reflux and diarrhea.  Denies any  urinary retention or abdominal discomfort.  Denies fever, chills, bleeding  problems, weight changes, swelling, rash, bruising, or sweating.  She has  had high blood sugars.   PHYSICAL EXAMINATION:  VITAL SIGNS:  Blood pressure 141/75, pulse 107,  respirations 22, saturation 98% on room air.  GENERAL:  The patient walks with a fairly normal gait pattern.  Her affect  was bright and her appearance was normal.  NEUROLOGIC/MUSCULOSKELETAL:  On motor examination she had fairly preserved  motor function, although there was some give-away weakness in the left heel.  Although when I did walk her on her heels and on her toes, she was able to  maintain balance without difficulty, and reported no problems  with strength.  Reflexes 2+ bilaterally at the knees, 1+ at the ankles.  Sensory function  may have been slightly depressed at the left foot, although this is  marginal.  The patient had normal upper extremity strength and general range  of motion.  On examination of the low back, she had mild scoliotic curves, one to the  right in the lower thoracic spine, and again to the left in the lower lumbar  spine, approximately in the 3 degree to 5 degree range.  The right  hemipelvis was elevated as a result.  She had normal neck range of motion.  The thoracic spine was flexed and extended without significant pain.  When  we flexed her at the waist, she was able to accomplish about 30 degrees of  true lumbar flexion with discomfort, and only 10 degrees of extension.  Rotation and lateral bending were about 25 degrees to either side.  Facet  maneuvers provoked significant pain to the left side, and moderate pain to  the right.  She had significant tenderness on palpation of the facets and  the paraspinals along the left side at approximately L3 through S1.  The  greatest pain seemed to be at the L3-L4 level.  I measured leg length today  from the SIS to the medial malleolus on either leg, and both of her legs  measured out at about 85 cm.  The skin was intact throughout.  It was warm.  Pulses were 2+ and regular.  HEART:  A regular rate and rhythm.  LUNGS:  Clear.  On provocative examination, Patrick's test was negative except for some  tightness in the hamstrings and sartorius muscles.  Straight leg raise  testing was negative to equivocal on the left.  The seated slump test was  negative on either side.  The compression test was negative.   ASSESSMENT:  1. Low back pain, which appears to be most consistent with a facet     arthropathy, particularly on the left at L3 through L5.  2. Thoracic and lumbar scoliosis.   PLAN:  1. The patient is not anxious to proceed with physical therapy, as  she has    had marginal results in the past; so we will not go down this road at     this point.  2. I would like to start her on Relafen 500 mg daily, titrating up to q.12h.     after one week if she has no benefit.  3. I need to obtain hard copies of her MRI performed at [facility] in     March, to examine these facets and disks once again.  4. May consider a facet block, depending upon the presentation and MR     review.  5. I will see the patient back in one month's time.  Meredith Staggers, M.D.   ZTS/MedQ  D:  02/22/2004 16:03:58  T:  02/22/2004 16:54:01  Job #:  [id number]   cc:   Teresa Pelton, M.D.  [address], [place], [place] [address]

## 2011-01-26 NOTE — Procedures (Signed)
[name], [name]               ACCOUNT NO.:  [id number]   MEDICAL RECORD NO.:  [id number]           PATIENT TYPE:   LOCATION:  [facility]                          FACILITY:  [facility]   PHYSICIAN:  Charlett Blake, M.D.DATE OF BIRTH:  [birth year]   DATE OF PROCEDURE:  10/05/2004  DATE OF DISCHARGE:                                 OPERATIVE REPORT   MEDICAL RECORD NUMBER:  [id number]   DATE OF BIRTH:  December 29, 1955   DATE OF SERVICE:  October 05, 2004   PROCEDURE:  Bilateral L5 dorsal ramus injection, bilateral L4 medial branch  block, bilateral L3 medial branch block, denervating the L4-5 and L5-S1  facets.   ATTENDING:  Charlett Blake, M.D.   INDICATION:  Back pain responsive to prior bilateral L4-5, L5-1 facet  denervation, now for confirmatory.   INFORMED CONSENT:  Informed consent was obtained after describing the risks  and benefits of the procedure with the patient; these included bleeding,  bruising, infection, loss of bowel or bladder function, either temporary or  permanent paralysis; she elects to proceed.   DESCRIPTION OF PROCEDURE:  The patient was placed prone on the fluoroscopy  table.  With Betadine prep and sterile drape, a 25-gauge inch-and-quarter  needle was used to anesthetize the skin and subcutaneous tissue.  Ten  milliliters of lidocaine were mixed with 1 mL of 8.4% sodium bicarb to  buffer the solution; 1.5 mL of solution were used in each of 6 sites.  A 22-  gauge 3-1/2-inch spinal needle was inserted, first to the left S1-sacral ala  junction.  Bone contact was made and Omnipaque 180 x0.3 mL was injected,  demonstrating no intravascular uptake, and a solution containing 1 mL of 40  mg/mL of Kenalog plus 2 mL of 2% lidocaine was injected x0.5 mL.  The same  procedure was repeated on the right side at the S1 SAP-sacral ala junction  using the same procedure.  Then we obliqued the C-arm towards the right 15  degrees and targeted the right L5  SAP-transverse process junction, once  again using a 22-gauge 3-1/2-inch spinal needle.  Bone contact was made and  then Omnipaque 180 x0.3 mL injected, showing no intravascular uptake and 0.5  mL of Kenalog and lidocaine solution was injected.  Then the same procedure  was repeated at right L4 SAP-transverse process junction; once again, a 22-  gauge 3-1/2-inch spinal needle was inserted to bone contact and Omnipaque  180 x0.3 mL showed no intravascular uptake and 0.5 mL of Kenalog and  lidocaine solution was injected.  Then the C-arm was obliqued toward the  left side and the left L4 SAP-transverse process junction was targeted.  Once again, the 22-gauge 3-1/2-inch spinal needle was inserted to bone  contact and Omnipaque solution 180 demonstrated no intravascular uptake with  0.3 mL, then 0.5 mL of Kenalog and lidocaine solution was injected.  Finally, the left L5 SAP-transverse process junction was targeted.  A 22-  gauge 3-1/2-inch spinal needle was inserted to bone contact and Omnipaque  180 demonstrated no intravascular uptake on live fluoroscopic injection.  Then  0.5 mL of Kenalog and lidocaine solution was injected.  The patient  tolerated the procedure well.   Post-injection instructions were given.  She is to schedule radiofrequency  ablation should she have at least 50%pain relief, even temporarily.  She did  well with Valium prior to this injection, better tolerated than last  injection, and also buffered lidocaine.      AEK/MEDQ  D:  10/05/2004 15:44:37  T:  10/06/2004 07:31:44  Job:  [id number]   cc:   Rosann Auerbach, MD  Fax: [phone]

## 2011-01-26 NOTE — Procedures (Signed)
[name], [name]               ACCOUNT NO.:  [id number]   MEDICAL RECORD NO.:  [id number]          PATIENT TYPE:  REC   LOCATION:  [facility]                          FACILITY:  [facility]   PHYSICIAN:  Charlett Blake, M.D.DATE OF BIRTH:  1956-01-07   DATE OF PROCEDURE:  10/01/2005  DATE OF DISCHARGE:                                 OPERATIVE REPORT   PROCEDURE:  Bilateral L4-5, L5-S1 facet denervation under fluoroscopic  guidance using medial branch blocks.   INDICATIONS:  Partial relief from previous medial branch blocks in January  2006 and then July 19, 2005.  She did have a medial branch block on  October 05, 2004, which was not particularly helpful.   Informed consent was obtained after describing the risks and benefits of the  procedure to the patient.  These include bruising, bleeding, infection, loss  of bowel and bladder function, temporary or permanent paralysis.  She  elected to proceed.  She has given written consent.   The patient was placed prone on the fluoroscopy table, Betadine prep and  sterile drape.  A 25-gauge 1-1/4 inch needle was used to anesthetize the  skin and subcu tissues, 1.5 mL of lidocaine x6 sites.  First the left S1  sacral ala junction was identified.  A 22-gauge 3-1/2 inch spinal needle was  inserted to bone contact, confirmed with lateral imaging, and then Omnipaque  180 x0.5 mL demonstrated no intravascular uptake.  Then 0.5 mL of a mixture  containing 1 mL of 40 mg/mL Depo-Medrol and 86m of 2% lidocaine were  injected.  Then the right S1 SAP-sacral ala junction was targeted, bone  contact made, confirmed with lateral imaging.  A 22-gauge 3-/2 inch spinal  needle was inserted under fluoroscopic guidance, bone contact made,  confirmed with lateral imaging, then 0.5 mL of the Depo-Medrol-lidocaine  solution were injected.  Next the C-arm was obliqued toward the right and  the right L5 SAP-transverse process junction targeted, bone contact made,  confirmed with lateral imaging, and Omnipaque 180 x0.5 mL demonstrated no  intravascular uptake, 0.5 mL of the Depo-Medrol-lidocaine solution injected.  Then the L4 SAP-transverse process junction was targeted, bone contact made,  confirmed with lateral imaging.  Omnipaque 180 x0.5 mL demonstrated no  intravascular uptake and 0.5 mL of the Depo-Medrol-lidocaine solution were  injected.  Next the C-arm was obliqued toward the left.  The left L4 SAP-  transverse process junction was targeted, bone contact made, confirmed with  lateral imaging, then 0.5 mL of the Depo-Medrol-lidocaine solution were  injected.  Then the left L5 SAP-transverse process junction targeted, bone  contact made, confirmed with lateral imaging, Omnipaque 180 x0.5 mL  demonstrated no intravascular uptake.  Then 0.5 mL of the Depo-Medrol-  lidocaine solution were injected.  The patient tolerated the procedure well.  Preinjection pain level 9/10, postinjection pain  level 7/10.  Will monitor pain by patient log.  If greater than 50% relief  lasting at least a day, then would do RF, no earlier than three weeks.      ACharlett Blake M.D.  Electronically Signed  AEK/MEDQ  D:  10/01/2005 12:37:49  T:  10/02/2005 06:28:10  Job:  [id number]   cc:   Meredith Staggers, M.D.  Fax: [phone]   Rosann Auerbach, MD  Fax: [phone]

## 2011-01-26 NOTE — Procedures (Signed)
[name], [name]               ACCOUNT NO.:  [id number]   MEDICAL RECORD NO.:  [id number]          PATIENT TYPE:  REC   LOCATION:  [facility]                          FACILITY:  [facility]   PHYSICIAN:  Charlett Blake, M.D.DATE OF BIRTH:  [birth year]   DATE OF PROCEDURE:  07/19/2005  DATE OF DISCHARGE:                                 OPERATIVE REPORT   PROCEDURE:  Bilateral L4-5, L5-S1 facet denervation under fluoroscopic  guidance using medial branch blocks.   INDICATIONS:  Partial relief from previous facet blocks.  It has been over  eight months since last injection.  Has had intercurrent medical problems,  i.e., gastrointestinal problems.  She has not had a good result with the  right paramedian L5-S1 epidural under fluoroscopic guidance translaminar  approach on Jan 22, 2005.   We will repeat this and if positive response, i.e., greater than 50% relief,  will have another facet block and then only do RF if two positives.   Informed consent was obtained after describing the risks and benefits of the  procedure.  These include bleeding, bruising, loss of bowel or bladder  function, either temporary or permanent paralysis.  She elected to proceed.   The patient was placed prone on the fluoroscopy table, Betadine prep,  sterile drape.  A 25-gauge 1-1/2 inch needle was used to anesthetize skin  and subcu tissues.  Lidocaine 1.5 mL were injected into each of six sites  and a 22-gauge 3-1/2 spinal needle inserted, first at the left S1 sacral ala  junction, bone contact made, confirmed with lateral imaging.  Omnipaque 180  x 0.3 mL demonstrated no intravascular uptake, and a solution containing 0.5  mL of 40 mg/mL Kenalog with 2.5 mL of 2% methylparaben-free lidocaine were  injected x0.5 mL.  The same procedure was repeated on the right side at the  right S1 sacral ala junction.  We moved the C-arm then to the right 10  degrees and imaged the right L5 SAP-transverse process junction.   Once again  using a 22-gauge 3-1/2 inch spinal needle, we inserted to bone contact and  then confirmed on lateral imaging.  Omnipaque 180 x0.3 mL demonstrated no  intravascular uptake on live fluoroscopy.  Then 0.5 mL of the Depo-Medrol  and lidocaine solution were injected.  Then the same procedure was repeated  at the right L4 SAP-transverse process junction.  Once again a 22-gauge 3-  1/2 inch spinal needle was inserted to bone contact and lateral imaging  confirmed proper placement, Omnipaque 180 x0.3 mL demonstrated no  intravascular uptake, and 0.5 mL of the Depo-Medrol and lidocaine was  injected.  Then the C-arm was obliqued toward the left side 10 degrees.  The  left L4 SAP-transverse process junction and once again a 22-gauge 3-1/2 inch  spinal needle was inserted to bone contact, lateral imaging confirmed proper  placement, Omnipaque 180 demonstrated no intravascular uptake with 0.3 mL  injected, then 0.5 mL of the Kenalog-lidocaine solution were injected.  Finally the left L5 SAP-transverse process junction was targeted.  A 22-  gauge 3-1/2 inch spinal needle was inserted  to bone contact, Omnipaque 180  demonstrated no intravascular uptake on live  fluoroscopic injection, then 0.5 mL of the Depo-Medrol and lidocaine  solution injected.  The patient tolerated the procedure well.  The patient  given a pain instruction sheet.  She will come back to see me in three  weeks.      Charlett Blake, M.D.  Electronically Signed     AEK/MEDQ  D:  07/19/2005 13:26:23  T:  07/20/2005 06:09:33  Job:  [id number]   cc:   Meredith Staggers, M.D.  Fax: [phone]

## 2011-01-26 NOTE — Consult Note (Signed)
[name], [name]               ACCOUNT NO.:  [id number]   MEDICAL RECORD NO.:  [id number]          PATIENT TYPE:  INP   LOCATION:  57                         FACILITY:  [facility]   PHYSICIAN:  Marland Kitchen T. Hoxworth, M.D.DATE OF BIRTH:  05-06-1956   DATE OF CONSULTATION:  12/04/2006  DATE OF DISCHARGE:                                 CONSULTATION   CHIEF COMPLAINT:  Abdominal pain, nausea, vomiting, diarrhea.   HISTORY OF PRESENT ILLNESS:  I was asked by Dr. Arnoldo Morale to evaluate [name].  She is a 55 year old female with a history of diabetes,  gastroparesis, chronic pain on large doses of oral narcotics and  multiple similar admissions for episodic nausea, vomiting, abdominal  pain, and diarrhea.  She has often had dilated small bowel on imaging  consistent with partial small-bowel obstruction.  I actually saw the  patient in 2006 after multiple similar presentations and laparoscopy was  performed at that time which showed no adhesions to the small intestine  and no evidence of mechanical obstruction.   The patient now presents with a typical episode.  She has 3 days of  sharp, constant mid and epigastric abdominal pain associated with  frequent nausea and vomiting of mucous material and frequent watery  diarrhea.  No melena, hematemesis or hematochezia.  No fever or chills.  She feels like her abdomen has been distended.   The patient states that she really has no period of time where the GI  tract does not bother her.  She has some chronic abdominal pain which  tends to be lower and not as severe.  She is only able to eat small  amounts or she will develop nausea, pain and vomiting.  She tends to  have chronic constipation, having bowel movements only every 4 to 5  days.   This episode she has not had any fever, chills, urinary symptoms.   PAST SURGICAL HISTORY:  1. Hysterectomy through Pfannenstiel incision.  2. She subsequently has had laparoscopic cholecystectomy.  3. Laparoscopic Nissen fundoplication performed at [facility].  4. Subsequently laparoscopy during an episode of apparent partial      small-bowel obstruction by me in 2006 as described above with      negative findings.   PAST MEDICAL HISTORY:  Medically she is followed for:  1. Diabetes mellitus.  2. Gastroparesis.  3. Severe chronic back and myofascial pain.  4. Depression.  5. Hypertension.   It should be noted that her hysterectomy was performed for uterine  cancer and she did receive external beam radiation.   MEDICATIONS ON ADMISSION:  1. MS Contin 100 mg three times a day.  2. Vicodin p.r.n.  3. Cymbalta 60 daily.  4. Altace 2.5 mg daily.  5. Metformin unknown dose daily.  6. Phenergan p.r.n.  7. Levemir 18 units daily.  8. Robaxin p.r.n.  9. Reglan an unknown dose.  10,  Glucotrol unknown dose.  1. Lidoderm patch.   ALLERGIES:  She is allergic to CODEINE and TETANUS.   SOCIAL HISTORY:  The patient is married.  No cigarette or alcohol use.  FAMILY HISTORY:  Significant for coronary artery disease in parents.  Otherwise negative.   REVIEW OF SYSTEMS:  GENERAL:  No fever, chills, weight change.  HEENT:  No vision, hearing, swallowing problems.  RESPIRATORY:  No shortness of  breath, cough, wheezing.  CARDIAC:  Denies chest pain, palpitations,  history of heart disease.  GI:  As above.  GU:  Negative.  EXTREMITIES:  She has chronic back and extremity pain.   PHYSICAL EXAMINATION:  VITAL SIGNS:  Afebrile, heart rate 98,  respirations 20, blood pressure 102/69.  GENERAL:  She is well-developed white female, alert, in no acute  distress.  SKIN:  Warm and dry.  No rash infection.  HEENT:  Sclerae nonicteric.  Nares and oropharynx clear.  NECK:  No palpable masses or thyromegaly.  LYMPH NODES:  No cervical, subclavicular or inguinal nodes palpable.  LUNGS:  Clear without wheezing or increased work of breathing.  CARDIAC:  Regular rate and rhythm.  No murmurs.  No  JVD.  No edema.  ABDOMEN:  Multiple well-healed laparoscopic incisions.  No apparent  distension.  No palpable hernias.  Her abdomen is soft with no guarding.  There is some fullness in the left lower quadrant consistent with stool.  There is diffuse subjective tenderness.  EXTREMITIES:  No joint swelling or deformity.  NEUROLOGIC:  Alert, oriented.  Motor and sensory exams grossly normal.   LABORATORY DATA:  Electrolytes normal.  Glucose is 109.  ESR normal at  7.  Urinalysis unremarkable.  Hemoglobin A1C 7.1.  Amylase normal at 33.  CEA normal at 0.7.  Lipase normal at 39.  LFTs show moderate diffuse  increases in bilirubin 2.3, alkaline phosphatase 145, SGOT 51, SGPT 72.  CBC shows white count of 8.7, hemoglobin 16.1.   IMAGING:  A KUB shows some moderately dilated loops of small bowel, the  mid abdomen with air fluid levels suspicious for partial small bowel  obstruction.  Ultrasound of the abdomen shows a previous  cholecystectomy, normal common bile duct at 6 mm.  No intrahepatic or  extrahepatic bile duct dilatation.  CT scan of the abdomen and pelvis is  interesting, entirely normal with minimal, if any, dilated small bowel  seen.   ASSESSMENT/PLAN:  A 55 year old white female with multiple medical  problems including diabetes, gastroparesis, chronic pain and chronic  large dose oral narcotics for pain control.  She has had multiple  previous similar episodes of abdominal pain, nausea, vomiting and  diarrhea with dilated small bowel.  Previous laparoscopy has shown no  adhesions, obstruction or radiation strictures in 2006.  She also has  moderately elevated LFTs.  I doubt she has mechanical small bowel  obstruction based on a previous laparoscopy.  CT scan this morning is  essentially normal.  There is no evidence of bile duct obstruction.  She  may possibly have fatty infiltration of liver or questionable medication  effect.  I doubt that surgical intervention will be  required.  I would continue  NG tube and bowel rest for now and repeat x-rays in the morning.  Will  follow with you.      Darene Lamer. Hoxworth, M.D.  Electronically Signed     BTH/MEDQ  D:  12/04/2006  T:  12/05/2006  Job:  [id number]

## 2011-01-26 NOTE — H&P (Signed)
[name], [name]               ACCOUNT NO.:  [id number]   MEDICAL RECORD NO.:  [id number]          PATIENT TYPE:  INP   LOCATION:  0104                         FACILITY:  [facility]   PHYSICIAN:  Bimal R. Manuella Ghazi, MD      DATE OF BIRTH:  February 21, 1956   DATE OF ADMISSION:  05/16/2006  DATE OF DISCHARGE:                                HISTORY & PHYSICAL   CHIEF COMPLAINT:  Nausea, vomiting, diarrhea and abdominal pain.   HISTORY OF PRESENT ILLNESS:  This is a 55 year old woman with history of  type diabetes with history of gastroparesis, chronic pain syndrome, history  of chronic nausea and vomiting who was discharged from there hospital on  April 22, 2006.  At that time she had been admitted for chronic nausea,  vomiting and diarrhea.  She was managed with supportive therapy and was  discharged home.  However, the patient states that ever since she was  discharged home, her symptoms have not resolved.  She states that she  continues to have constant nausea with frequent vomiting and eight to 10  loose, watery bowel movements a day.  She states over the last four days her  symptoms have gotten so bad that she can no longer keep any applesauce or  mashed potatoes down whereas prior she could.  She denies any hematemesis,  bright red blood per rectum or black, tarry stool.  She also denies any  fevers or frank chest discomfort.  She does describe some mild epigastric  discomfort related to her frequent emesis and states that she does get  chills. She also states that her abdominal pain has been around a 7 out of  10 since she was discharged from there hospital.  However, the past few days  it has been 10 out of 10.  She states that it is not localized in one area  but does radiate to her back. She also endorses some mild dysuria. Otherwise  she denies any lightheadedness or dizziness, recent travel or sick contacts.   REVIEW OF SYSTEMS:  As in the HPI.  The remaining 18-point review of  systems  is negative.   ALLERGIES:  1. The patient states that STEROID INJECTIONS in the past have caused her      to have bradycardia.  2. CODEINE.   PAST MEDICAL HISTORY:  1. Diabetes mellitus.  2. Chronic nausea, vomiting and diarrhea thought to be secondary to      gastroparesis.  She has been offered gastric __________ in the past but      this was never followed up.  3. History of total abdominal hysterectomy secondary to uterine cancer,      status post external beam radiation.  4. Hiatal hernia with Nissen fundoplication.  5. GERD.  6. History of laparoscopic cholecystectomy.  7. History of laparoscopic take-down of adhesions.  8. Chronic pain syndrome.   CURRENT MEDICATIONS:  1. MS Contin 60 mg t.i.d.  2. Vicodin 10/650 mg t.i.d.  3. Reglan 10 mg q.a.c. and at night.  4. Altace 10 mg daily.  5. __________  18 units  subcu at night.  6. Cymbalta 60 mg daily.  7. Metformin 500 mg daily.   SOCIAL HISTORY:  She lives with her husband.  She denies any habits.   FAMILY HISTORY:  Significant for diabetes mellitus and breast cancer in a  mother.  She has secondary relatives with coronary artery disease.   PHYSICAL EXAMINATION:  VITAL SIGNS:  Blood pressure 113/87, pulse 108,  respiratory rate 18, temperature 97.7.  Oxygen saturation is 96% on room  air.  GENERAL:  She is alert and oriented x3 in no acute distress despite  describing 10/10 abdominal pain when I examined her.  NECK:  Supple with no lymphadenopathy.  Her JVP is flat.  She has no carotid  bruits.  She has 2+ carotid upstrokes, symmetrical and brisk bilaterally.  Her oropharynx is moist and clear.  LUNGS:  Clear to auscultation bilaterally with no wheezes, rhonchi, or  rales.  CARDIOVASCULAR:  Regular rate and rhythm.  Normal S1 and S2.  No murmurs,  rubs or gallops.  ABDOMEN:  She has hyperactive bowel sounds.  Abdomen is soft, nondistended.  There is no palpable masses. She does have some voluntary  guarding that is  distractible.  She does not have any rebound.  EXTREMITIES:  2+ radial, posterior tibialis pulsessymmetric bilaterally  without lower extremity edema, clubbing or cyanosis.  SKIN: Normal skin turgor without any rashes.  NEUROLOGIC:  Nonfocal.   LABORATORY DATA:  Abdominal series show a nonspecific bowel gas pattern with  mild diffuse gaseous distention of bowel and a single, mildly prominent  small bowel loop in the left abdomen but no free air.  Her other lab work is  unremarkable with a white count of 8.9, hematocrit 43.3, platelets 251.  Sodium 141, potassium 3.9, glucose 226, creatinine 0.7, total bilirubin 1.4,  alk phos 125, AST 112, ALT 146, albumin 3.8, lipase slightly elevated at 66.   IMPRESSION:  1. Chronic nausea, vomiting and diarrhea, most likely secondary to      gastroparesis.  However, she has a mild obstructive picture on her LFTs      with just a slightly elevated lipase.  This does not seem to be      pancreatitis given the chronic nature of her syndrome.  She does not      appear to be obstructive.  However, most likely etiology is      gastroparesis.  2. Diabetes mellitus.  3. Chronic pain syndrome.   PLAN:  The patient will be admitted for IV fluid hydration and antiemetic  and pain control.  She will be started on Zofran and will be continued on  her Reglan.  In addition, she will be continued on her insulin regimen as  well as sliding scale insulin.  I have also continued her narcotics given  her additional IV morphine for breakthrough discomfort.  She will also get  IV fluids.  I have held her metformin secondary to this concern of her  lactic acidosis given possibly dehydration, although  her labs do not  suggest this.  She will need a GI consult for further  management, I think, and maybe discussion of gastric pacing as appropriate.  Additionally, I have ordered a CT scan of the abdomen and pelvis given her obstructive pattern just to  rule out any other possible etiologies of this  chronic nausea and vomiting and diarrhea.           ______________________________  Cyndi Lennert. Manuella Ghazi, MD     BRS/MEDQ  D:  05/17/2006  T:  05/17/2006  Job:  [id number]

## 2011-01-26 NOTE — H&P (Signed)
NAMEAVIAN, GREENAWALT NO.:  [id number]   MEDICAL RECORD NO.:  [id number]          PATIENT TYPE:  EMS   LOCATION:  ED                           FACILITY[facility]  [facility]   PHYSICIAN:  Marletta Lor, MDDATE OF BIRTH:  08/29/1956   DATE OF ADMISSION:  08/22/2006  DATE OF DISCHARGE:                              HISTORY & PHYSICAL   CHIEF COMPLAINT:  Intractable nausea, vomiting, abdominal, and back  pain.   HISTORY OF PRESENT ILLNESS:  The patient is a 55 year old white female  who was last hospitalized 2 months ago for similar complaints.  She had  nausea, vomiting and diarrhea, and radiographic evidence of partial  small-bowel obstruction.  Symptoms improved, and she was discharged.  For the past 2 or 3 days she has had recurrent nausea, vomiting, mid-  abdominal pain, as well as back pain.  She states that she has also had  frequent loose diarrheal stools.  Due to her refractory symptoms, she  presented to the emergency department for evaluation.  Acute abdominal  series revealed findings consistent with small-bowel obstruction.  The  patient is now admitted for further evaluation and treatment of severe  intractable nausea and vomiting.   PAST MEDICAL HISTORY:  1. As mentioned, the patient was admitted here 2 months ago for      intractable nausea and vomiting.  2. History of diabetes and diabetic gastroparesis.  3. History of hiatal hernia.  4. Chronic gastroesophageal reflux disease.  5. Status post Nissen fundoplication in 3300.  6. She has a chronic pain syndrome on chronic narcotics.   PAST SURGICAL HISTORY:  1. Laparoscopic cholecystectomy.  2. Total abdominal hysterectomy for uterine cancer.  This was also      followed by radiation treatment.   CURRENT MEDICATIONS:  1. MS Contin 30 mg t.i.d.  2. Cymbalta 60 mg daily.  3. Reglan 20 mg before each meal.  4. Protonix 40 mg daily.  5. Metformin 500 mg daily.  6. Levemir 18 units at bedtime.   SOCIAL HISTORY:  She lives with her husband.  Nondrinker, nonsmoker.   FAMILY HISTORY:  Positive for coronary artery disease.  Otherwise,  fairly noncontributory.   PHYSICAL EXAMINATION:  GENERAL:  A mildly overweight white female who  appeared unwell but in no acute distress.  VITAL SIGNS:  Stable.  She was afebrile.  Respirations and pulse were  normal.  Head revealed normal pupil responses.  Conjunctiva clear.  Anicteric.  Ear, nose and throat negative.  The patient was edentulous.  Mucous membranes appeared very well hydrated.  NECK:  No bruits or neck  vein distension.  CHEST:  Clear.  CARDIOVASCULAR:  Quiet precordium.  There was no tachycardia/  ABDOMEN:  Some epigastric tenderness.  Bowel sounds were quite active.  No organomegaly.  EXTREMITIES:  Negative.  No edema.  Peripheral pulses were full.   LABORATORY DATA:  Laboratory studies were reviewed.  This revealed a  white count of 9.8, H&H 15.4/45.5.  Chemistries were unremarkable,  except for slight depressed sodium of 3.4, glucose 199, BUN and  creatinine 15 and 0.7, respectively.  Lipase normal.  Urinalysis  negative.   IMPRESSION:  Intractable nausea, vomiting, and abdominal pain with  radiographic evidence of partial small-bowel obstruction.   DISPOSITION:  Will admit to the hospital, support with IV fluids and  symptom control.  Will follow up acute abdominal series in the morning.      Marletta Lor, MD  Electronically Signed     PFK/MEDQ  D:  08/23/2006  T:  08/23/2006  Job:  [id number]

## 2011-01-26 NOTE — Discharge Summary (Signed)
NAMEWINDSOR, ZIRKELBACH               ACCOUNT NO.:  [id number]   MEDICAL RECORD NO.:  [id number]          PATIENT TYPE:  INP   LOCATION:  49                         [facility]AC[facility]   PHYSICIAN:  [name], MDDATE OF BIRTH:  [birth year]   DATE OF ADMISSION:  08/23/2006  DATE OF DISCHARGE:  08/27/2006                               DISCHARGE SUMMARY   DISCHARGE DIAGNOSES:  1. Partial small bowel obstruction with nausea, vomiting and abdominal      pain secondary to severe gastroparesis, improved status post      conservative medical management.  2. Chronic low back pain without specific MRI abnormality last      performed March 30, 2006.  Outpatient neurosurgical evaluation to be      arranged by patient. Outpatient chronic narcotic pain management at      pain clinic just prior to admission.  3. Type 2 diabetes with suboptimal control.  Hemoglobin A1c 7.8,      increase Lantus outpatient follow-up.  4. Severe gastroparesis multifactorial due to chronic high dose      narcotics as well as underlying diabetic neuropathy. Continue      Reglan. Outpatient GI follow-up Gessner p.r.n.  5. History of total abdominal hysterectomy secondary to uterine cancer      status post external beam radiation, remote.  6. History of hiatal hernia status post Nissen fundoplication, remote.  7. History of gastroesophageal reflux disease.  8. History of laparoscopic cholecystectomy.  9. History of laparoscopic lysis of adhesions in 2006 due to problem      #1.   DISCHARGE MEDICATIONS:  As prior to admission without change and  include:  1. MS Contin 60 mg p.o. t.i.d.  2. Vicodin 10/650 p.o. t.i.d. plus p.r.n.  3. Cymbalta 40 mg p.o. daily.  4. Altace 2.5 mg p.o. b.i.d.  5. Metformin 500 mg p.o. q.a.m.  6. Phenergan 25 mg p.o. q.8 hours.  7. Levemir 18 units subcu daily.  8. Robaxin 500 mg p.o. q.6 h p.r.n. pain.  9. Reglan 10 mg p.o. q.i.d. a.c. and h.s.   DISPOSITION:  The patient is  discharged home medically stable condition  tolerating a soft and pureed diet as prior to admission which is her  regular meal.  Hospital follow-up will be arranged with primary care Demeka Sutter Billie  Bean at [facility] in the next 2-3 weeks or as needed.  Also  follow up as previously arranged with pain clinic physician Dr. Naaman Plummer  and on an as-needed basis with GI physician, Dr. Carlean Purl and her surgery  to be determined.   CONDITION ON DISCHARGE:  Medically improved.   HOSPITAL COURSE BY PROBLEM:  Recurrent partial small bowel obstruction.  The patient is a 55 year old woman with severe gastroparesis and history  of multiple recurrent hospitalizations due to nausea, vomiting, and  abdominal pain with findings of partial small bowel obstruction on x-  ray, previous extensive GI and surgical evaluation throughout the past  years including lysis of adhesion, exploratory lap in 2006 failed to  find any true adhesions thus making her symptoms likely related to  the  severe gastroparesis which has been documented by gastric emptying scan.  The most recent in the record done in August 2006. Multiple attempts  have been made to decrease the patient's MS Contin dose as she has  exacerbation whenever this pain medication is increased but these  attempts have been unsuccessful during this particular hospitalization.  She was admitted, made n.p.o., given IV fluid hydration and bowel rest.  She was given IV Dilaudid until she was able to tolerate p.o. and then a  reduced dose of her MS Contin was resumed. She was given IV Reglan until  tolerating p.o. as well as milk of magnesia and Maalox. She was covered  with sliding scale as well as a reduced dose of her Lantus during this  hospitalization until she was tolerating her regular pureed diet. At  this time her diarrhea has resolved, her nausea and vomiting has  resolved, her pain is back to her chronic baseline located in only in  her  back and she is felt stable for discharge home.  She has been  hemodynamically stable and there was no need for surgical of GI  evaluation during this hospitalization as issues have responded to  conservative medical management.  Further outpatient follow-up on an  ongoing basis to include continued efforts at trying to wean the  patient's narcotic doses as able and tolerated.      [name], MD  Electronically Signed     VAL/MEDQ  D:  08/27/2006  T:  08/27/2006  Job:  [id number]

## 2011-01-26 NOTE — Assessment & Plan Note (Signed)
[name] is back for her chronic low back pain.  She has been doing quite  well with the combination of Avinza and Vicodin.  She is on Avinza 90 mg  q. 8hr., and Vicodin 10/660 1 q. 8hr. p.r.n.  Her pain has been at a 5-6  out of 10.  She has been keeping her bowels regular.  She was admitted  to the hospital this week with a gastroenteritis, and she was severely  dehydrated, and actually was just discharged today and feeling much  better.   Patient generally describes her pain as sharp and constant.  Pain  interferes with general activity, relations with others, and enjoyment  of life on a moderate level.  She is really not active with exercise at  this point.  She does stay active with activities and chores in the  house.   REVIEW OF SYSTEMS:  Negative for any new symptomatology other than that  mentioned above.  Full review is in the health and history section.   SOCIAL HISTORY:  Unchanged.   PHYSICAL EXAMINATION:  Blood pressure is 123/72, pulse is 81,  respiratory rate is 16.  She is 99% on room air.  Patient is pleasant in no acute distress.  She alert and oriented times  3.  Affect is bright and appropriate.  Gait is slightly wide base.  She  uses a cane for support.  Strength is near 5/5.  Reflexes are 2+.  Sensory exam is normal.  The back is less tender in the lumbar  paraspinal facets, and PSIS areas.  She was limited with flexion to  about 30 degrees with pain.  Extension also caused pain at 10 degrees,  and she had pain with rotation and lateral bending at about 15-20  degrees.  HEART:  Regular.  CHEST:  Clear.  ABDOMEN:  Soft, nontender.   ASSESSMENT:  1. Chronic low back pain related to lumbar spondylosis of facet      disease.  2. Myofascial cervical pain with diskogenic component.  3. History of gastrointestinal dysmotility, which has improved.  4. Anxiety/depression.  5. Obesity.   PLAN:  1. Continue Avinza 90 mg q. 8hr.  2. Vicodin 10/660 1 q. 8hr.  p.r.n.  3. Cymbalta 60 mg daily.  4. Lidoderm patches daily.  5. Encourage range of motion regular exercises.  I set her up, I gave      her a set of Pilates exercises to work on.  6. We will see her back in 3 months' time.  She will see the nurse      clinic in 1 month.  Could consider facet injections.      Meredith Staggers, M.D.  Electronically Signed     ZTS/MedQ  D:  08/28/2006 10:47:00  T:  08/28/2006 11:57:21  Job #:  [id number]

## 2011-01-26 NOTE — Assessment & Plan Note (Signed)
HISTORY:  [name] is back regarding her low back pain.  She had a third set  of facet blocks on October 31, 2004, as she had only a 20% reduction after  the second set of injections.  (RF had been scheduled prior).  Since the  last set of injections, the patient's pain has been severe.  She reports  terrific pain in the lower lumbar area.  She rates the pain at a 10/10.  The  pain really is happening with any movement whatsoever.  She has had problems  sleeping.  The pain only gets better with medication and rest.  She reports  fair improvement in her pain with the pain medications she is using,  including MS Contin 30 mg q.12h.; hydrocodone 10/660 mg, one q.8h. p.r.n.  She also has some relief with Lidoderm, Flexeril and Neurontin.  The patient  states that she is only sleeping about three hours a night.  She feels  depressed.  She states that the pain is not the only issue leading to her  depression, but did not elaborate.  The patient is walking with a cane.  The  patient is able to walk for five to 10 minutes at a time.  She is able to  get around in the house, but was having a hard time with steps.  She is not  driving.   SOCIAL HISTORY:  The patient lives with her husband.  No significant changes  noted.   REVIEW OF SYSTEMS:  The patient reports night sweats and problems with her  sugars.  Occasional chest discomfort, spasms, numbness, depression and  decreased taste.   PHYSICAL EXAMINATION:  VITAL SIGNS:  Blood pressure 137/79, pulse 95,  respirations 16.  She is saturating 97% on room air.  GENERAL:  The patient is very flat, and was tearful at times.  She was alert  and oriented x3.  She appeared fatigued.  She is moderately obese.  She  ambulated with a flexed posture to the lumbar spine.  NEUROLOGIC:  Motor and sensory exams revealed some pain inhibition in the  legs, but no focal motor or sensory loss.  Reflexes were all 1+.  She had  pain upon palpation in the lumbar  facets and paraspinals, and to a lesser  extent at the PSIS areas bilaterally.  The iliac crests were tender.  She  was limited in all planes of motion today.  The gluteal regions and greater  trochanter areas were visibly tender today.  HEART:  A regular rate and rhythm.  CHEST:  Clear.   ASSESSMENT:  1.  Low back pain, as related to lumbar radiculopathy and questionable facet      disease.  She had some intermittent responses to the trans-foraminal      block and the facet injections.  At this point she is not ready for more      injections.  She wants something to improve her pain.  2.  Obesity.  3.  Low thoracic and lumbar scoliosis.   PLAN:  1.  I think the best way to go at this point is oral steroids, to see if we      can decrease her inflammation to some extent.  She was really too tender      to accurately examine today.  A hand-written taper which will complete      over 12-15 days.  2.  Will start the patient on Cymbalta for mood and pain relief, beginning  at 30 mg daily for one week, then up to 60 mg daily.  3.  The patient has been on Relafen for some time without significant      benefit.  We will stop the Relafen in light of her beginning the      prednisone.  4.  Refill MS Contin 30 mg q.12h. and her Vicodin 10/660 mg, one q.6h.      p.r.n.  5.  She may remain on Flexeril, Lidoderm and Neurontin at their current      doses.  6.  I will see her back in one month's time.  I did fill out a temporary      handicapped pass today.      ZTS/MedQ  D:  11/22/2004 11:08:07  T:  11/22/2004 11:36:06  Job #:  [id number]   cc:   Teresa Pelton, M.D.  [address] [place]  [place] [address]  Fax: [phone]

## 2011-01-26 NOTE — Discharge Summary (Signed)
[name], [name]               ACCOUNT NO.:  [id number]   MEDICAL RECORD NO.:  [id number]          PATIENT TYPE:  INP   LOCATION:  5122                         FACILITY:  [facility]   PHYSICIAN:  [name], MDDATE OF BIRTH:  1955-11-30   DATE OF ADMISSION:  05/24/2006  DATE OF DISCHARGE:  05/29/2006                                 DISCHARGE SUMMARY   DISCHARGE DIAGNOSES:  1. Severe gastroparesis likely secondary to chronic pain medications and      diabetes type 2.  2. Chronic back pain.   HISTORY OF PRESENT ILLNESS:  [name] is a 55 year old white female  admitted on May 24, 2006 with chief complaint of nausea, vomiting,  abdominal pain, and diarrhea. The patient has undergone a workup for  gastroparesis in the past. She was admitted for further evaluation and  treatment.   PAST MEDICAL HISTORY:  1. Diabetes type 2.  2. Chronic nausea and vomiting secondary to gastroparesis.  3. History of total abdominal hysterectomy.  4. History of hiatal hernia status post Nissen fundoplication.  5. GERD.  6. History of laparoscopic cholecystectomy.  7. History of laparoscopic adhesiolysis.  8. Chronic pain syndrome.   COURSE OF HOSPITALIZATION:  Severe gastroparesis likely secondary to chronic  pain medications and diabetes type 2. The patient was admitted and was given  IV hydration. She was also given Reglan and Phenergan.  She was ultimately able to advance her diet. A GI consult was obtained, and  the patient's MS Contin was decreased to 30 mg p.o. q.8 h. which the patient  is now agreeable to. It is also recommended that the patient follow up at  the [facility] and call for an appointment.   PERTINENT LABORATORIES AT DISCHARGE:  Hemoglobin A1c 6.8. BUN 5, creatinine  0.7. Hemoglobin 1.6, hematocrit 34.   MEDICATIONS AT DISCHARGE:  1. MS Contin 30 mg every eight hours.  2. Reglan 10 mg every six hours for nausea.  3. Cymbalta 60 mg p.o. daily.  4.  Protonix 40 mg p.o. b.i.d.  5. Vicodin 10/650, one tab p.o. t.i.d. as needed.  6. Metformin 500 mg p.o. daily.  7. __________ insulin 18 units subcu daily.   DISPOSITION/PLAN:  Transfer the patient to home.   FOLLOW UP:  The patient instructed to follow up with Wyatt Mage, FNP in 1-2  weeks and contact the office for an appointment. She is also instructed to  follow up at [facility] and has been provided with the numbers. She  is  instructed to call for follow-up appointment. A follow-up appointment has  been scheduled for the patient to see Dr. Ephriam Knuckles on Friday, September 21,  at 11:20 a.m. She is instructed to call Mena Pauls, FNP if she should  develop worsening nausea or vomiting or to return directly to the ER.     ______________________________  Debbrah Alar, NP      Jannifer Rodney. Asa Lente, MD  Electronically Signed    MO/MEDQ  D:  05/28/2006  T:  05/29/2006  Job:  [id number]   cc:   Billie D. Bean, FNP

## 2011-01-26 NOTE — Consult Note (Signed)
NAMEVINA, BYRD               ACCOUNT NO.:  [id number]   MEDICAL RECORD NO.:  [id number]          PATIENT TYPE:  INP   LOCATION:  [place]                         FACILITY:  [facility]   PHYSICIAN:  Marland Kitchen T. Hoxworth, M.D.DATE OF BIRTH:  02-20-1956   DATE OF CONSULTATION:  04/15/2005  DATE OF DISCHARGE:                                   CONSULTATION   CHIEF COMPLAINT:  Abdominal pain, nausea and vomiting.   HISTORY OF PRESENT ILLNESS:  I was asked by Dr. Jenny Reichmann to evaluate [name].  She is a 55 year old white female admitted to [facility] by Dr.  Jenny Reichmann on April 13, 2005 with recurrent abdominal pain, nausea, vomiting and  diarrhea. She was actually discharged from the surgical service at [facility] two days previously where she was hospitalized with apparent  partial small bowel obstruction. [name] has had at least four similar  episodes since April of this year which have required hospitalization, all  consistent with partial small bowel obstruction that resolved  nonoperatively. This episode is identical to the rest. She describes the  onset of sharp fairly constant mid abdominal and diffuse abdominal pain  following eating which is followed by nausea, retching without any high-  volume emesis or bilious emesis and some diarrhea as well. On this  admission, x-rays revealed markedly dilated stomach and also dilated  proximal small bowel. She currently is feeling much better following  placement of an NG tube and IV fluids with still a little pain but markedly  improved. No fever or chills. No GU symptoms.   PAST MEDICAL HISTORY:  Surgery significant for hysterectomy through a  Pfannenstiel incision in about 2000. She also has had a laparoscopic  cholecystectomy about the same time and a laparoscopic Nissen fundoplication  for reflux in the late 1990s. These were all done out of town. Medically she  is followed for lumbar disk disease with chronic pain on narcotics.  Also,  obesity, depression and diabetes mellitus until recently controlled with  diet and chronic constipation.   MEDICATIONS ON ADMISSION:  1.  MS Contin 30 mg q.8 h.  2.  Relafen b.i.d.  3.  Neurontin 300 mg t.i.d.  4.  Cymbalta 60 mg daily.  5.  Vicodin p.r.n.  6.  Reglan 5 mg q.6 h.  7.  Zelnorm 6 mg b.i.d.   ALLERGIES:  She is allergic to STEROIDS.   SOCIAL HISTORY:  Recently married, no tobacco or alcohol use.   FAMILY HISTORY:  Noncontributory.   REVIEW OF SYSTEMS:  GENERAL:  Denies fever, chills, weight change.  RESPIRATORY:  No shortness of breath, cough, wheezing. CARDIAC:  No chest  pain, palpitations, swelling. ABDOMEN:  GI as above. GU:  No urinary  burning, frequency. MUSCULOSKELETAL:  Positive for chronic back pain.  NEUROLOGIC:  No history of abnormal bleeding, blood clots, phlebitis.   PHYSICAL EXAM:  GENERAL:  She is afebrile, heart rate is 120, blood pressure  is 128/88, respirations 20, oxygen sats 95% on room air.  GENERAL:  Mildly obese white female in no acute distress.  SKIN:  Warm  and dry with no rash infection.  HEENT:  No mass or thyromegaly. Sclerae nonicteric. Nares. Oropharynx clear.  NG tube in place.  LUNGS:  Clear to auscultation without wheezing or increased work of  breathing.  CARDIAC:  Regular rate and rhythm without murmurs, no edema. ABDOMEN:  Healed Pfannenstiel and laparoscopic incisions. No hernias. Mildly obese.  There is moderate diffuse tenderness. No peritoneal signs. No discernible  masses or organomegaly.  EXTREMITIES:  No joint swelling, deformity.  NEUROLOGIC:  Alert and oriented. Motor sensory exams grossly normal.   LABORATORY DATA:  White blood count is 10.4, hemoglobin 16.1, platelets 288,  potassium abnormal for 3.1, glucose 219. LFT's moderately abnormal. AST 102,  ALT 187, alk phos 84, bilirubin 1.6, lipase 41.   IMAGING:  KUB on admission showed fairly marked gastric distension and  moderately distended proximal  small bowel as well. Post NG tube gastric  extension is resolved and small bowel distension improved.   CT scan of the abdomen and pelvis performed on April 11, 2005 was entirely  negative without obstruction or other significant abnormality.   ASSESSMENT/PLAN:  A 56 year old white female with recurrent abdominal pain,  nausea and vomiting that appears most consistent with intermittent small  bowel obstruction likely secondary to adhesions from her previous surgery.  Gastroparesis secondary to diabetes and narcotics may play a role but it  would be hard to explain all her symptoms and x-ray findings based on this.  Her current episode appears to be resolving as her previous episodes have.  However, due to repeated and increasingly frequent episodes we may need to  consider laparoscopy or possibly laparotomy and lysis of adhesions in an  effort to prevent further episodes. This was discussed with the patient who  is considering her options. There is no need for emergency surgical  intervention at this time. Will follow with you.       BTH/MEDQ  D:  04/15/2005  T:  04/16/2005  Job:  [id number]

## 2011-01-26 NOTE — Procedures (Signed)
[name], [name]               ACCOUNT NO.:  [id number]   MEDICAL RECORD NO.:  [id number]           PATIENT TYPE:   LOCATION:                                 FACILITY:   PHYSICIAN:  Charlett Blake, M.D.DATE OF BIRTH:  [birth year]   DATE OF PROCEDURE:  11/22/2005  DATE OF DISCHARGE:                                 OPERATIVE REPORT   MEDICAL RECORD NUMBER:  [id number]   DATE OF BIRTH:  1955-09-28   PROCEDURE:  Left lumbar radiofrequency neurotomy under fluoroscopic  guidance.   ATTENDING:  Charlett Blake, M.D.   INDICATION:  Previous relief of lumbar pain with medial branch blocks x2.  She has pain that persists despite narcotic analgesic management.   INFORMED CONSENT:  Informed consent was obtained after describing the risks  and benefits of the procedure to the patient; these include bleeding,  bruising, infection, loss of bowel and bladder function, temporary or  permanent paralysis and she elects to proceed.   DESCRIPTION OF PROCEDURE:  The patient was placed prone on fluoroscopy table  with Betadine prep and sterilely draped.  A 25-guage inch-and-a-half needle  was used to anesthetize the skin and subcu tissues with 3 mL of lidocaine 1%  x3, then a 22-gauge 10-cm RF needle with a 10-cm active tip was utilized,  targeting left S1-SAP sacral ala junction with bone contact made and  confirmed with lateral imaging.  Then a 3-volt twitch demonstrated only  local paraspinal twitch and then 1 mL of Depo-Medrol/lidocaine was injected,  then lesioning was done, RF, 70 degrees x70 seconds; this was repeated,  targeting the left L5-SAP transverse process junction and left L4 SAP  transverse process junction, same injected, same RF parameters.  The patient  tolerated the procedure well.   Follow up in 5 weeks for right-sided lumbar RF.      Charlett Blake, M.D.  Electronically Signed     AEK/MEDQ  D:  11/22/2005 14:46:28  T:  11/24/2005 06:56:50  Job:   [id number]

## 2011-01-26 NOTE — Discharge Summary (Signed)
[name], DEETZ               ACCOUNT NO.:  [id number]   MEDICAL RECORD NO.:  [id number]          PATIENT TYPE:  INP   LOCATION:  5007                         FACILITY:  [name]   PHYSICIAN:  [name], MDDATE OF BIRTH:  1956-08-31   DATE OF ADMISSION:  05/17/2006  DATE OF DISCHARGE:  05/20/2006                                 DISCHARGE SUMMARY   DISCHARGE DIAGNOSES:  1. Severe gastroparesis with nausea, vomiting, abdominal pain, improved.      Not tolerating pureed liquid diet as prior to admission.  2. Type 2 diabetes.  3. Chronic low back pain with outpatient pain management at Dr. Ephriam Knuckles'      pain clinic.  4. History of total abdominal hysterectomy secondary to uterine cancer      status post external beam radiation.  5. Hiatal hernia status post Nissen fundoplication.  6. Gastroesophageal reflux disease.  7. History of laparoscopic cholecystectomy.  8. History of laparoscopic lysis of adhesions.   DISCHARGE MEDICATIONS:  Discharge medications are as prior to admission  without change and include:  1. MS Contin 60 mg t.i.d.  2. Vicodin 10/650 t.i.d. plus p.r.n.  3. Reglan 10 mg q.a.c. and h.s.  4. Altace 10 mg daily.  5. Lantus 18 units subcu q.h.s.  6. Metformin 500 mg p.o. q.a.m.  7. Soma 350 mg b.i.d.  8. Cymbalta 60 mg p.o. daily.  9. Phenergan p.r.n.   DISPOSITION:  The patient is followed up by her primary care Bianco Cange,  Wyatt Mage, FNP, nurse practitioner at [facility] as previously  scheduled for September 25 as well as per routine with  Dr. Ephriam Knuckles, her pain management doctor. Will also pursue outpatient  reevaluation at [facility] such as [facility] or [facility] as she  has previously been evaluated at [facility] for consideration of a gastric  pacemaker. Her follow up there has been complicated per patient report, and  would like to change university evaluation. May need to coordinate through  [facility]. Has previously been  seen by Dr. Carlean Purl. Will defer outpatient  primary care Lason Eveland to arrange through discussion with Dr. Carlean Purl or as  needed.   CONDITION ON DISCHARGE:  Medically improved and stable.   HOSPITAL COURSE:  1. Recurrent gastroparesis. The patient is a 55 year old woman with      diabetes and chronic narcotic use due to severe low back pain for which      she is followed by the pain clinic, Dr. Angelique Holm, who returned to the      emergency room on September 6 with nausea, vomiting, diarrhea, and      abdominal pain consistent with her other episodes of gastroparesis      where she has a long history of such. Most recently hospitalized August      8 through August 13 at [facility] for same as well as other previous      hospitalizations over the past two years for similar symptoms. She was      admitted for control of her symptoms and to rule out other etiologies  such as small bowel obstruction, pancreatitis, infection that were      considered and ruled out. By being made n.p.o. and given IV fluid      hydration as well symptomatic control, the patient's symptoms improved      and she was then again resumed on clear liquid diet which is advanced      and tolerated without much difficulty. She does have chronic pain, but      it is controlled with her home pain medications and is at baseline at      this time. She is now felt stable for discharge home. Further      outpatient follow-[facility] for [facility] of gastric      pacemaker. Will be deferred to outpatient primary care Latif Nazareno as she      has failed medical management. Unable to wean her high doses of chronic      narcotics as well as maximal doses of prokinetic agent such as Reglan.      Please see details above.  2. Other medical issues. The patient's other medical issues such as      diabetes, depression, and  chronic low back pain have been managed as      per her home regimen. No changes were made in this  schedule. Outpatient      follow-up as scheduled.      [name], MD  Electronically Signed     VAL/MEDQ  D:  05/20/2006  T:  05/20/2006  Job:  [id number]

## 2011-01-26 NOTE — Procedures (Signed)
[name], [name]               ACCOUNT NO.:  [id number]   MEDICAL RECORD NO.:  [id number]          PATIENT TYPE:  REC   LOCATION:  [facility]                          FACILITY:  [place]   PHYSICIAN:  Charlett Blake, M.D.DATE OF BIRTH:  [birth year]   DATE OF PROCEDURE:  09/14/2004  DATE OF DISCHARGE:                                 OPERATIVE REPORT   PROCEDURE:  Bilateral L4-5 and L5-S1 facet denervation using medial branch  blocks.   Informed consent was obtained after describing risks and benefits of the  procedure to the patient.  These included bleeding, bruising, infection,  loss of bowel and bladder function, either temporary or permanent.  She  elects to proceed.   DESCRIPTION OF PROCEDURE:  The patient was placed prone on the fluoroscopy  table.  Betadine prep and sterile drape.  25 gauge Intracore needle was used  to anesthetize the skin and subcutaneous tissue.  1.5 mL of 1% lidocaine  into each of six sites.  A 22 gauge 3-1/2 inch spinal needle was inserted to  the S1 sacral ala SAP junction first on the left side.  Bone contact was  made.  Omnipaque 180 x 0.3 mL was injected demonstrating no intravascular  uptake and then a solution which contains 1 mL of 40 mg/mL Kenalog plus 2 mL  of 2% lidocaine was injected x 0.5 mL.  The same procedure was repeated on  the right S1 SAP sacral ala junction using the same procedure.  Then we  obliqued the C-arm toward the right 10 degrees and targeted the right L5 SAP  transverse process junction, once again using the 22 gauge 3-1/2 inch spinal  needle, bone contact was made and then Omnipaque 180 x 0.3 mL were injected  showing no intravascular uptake and 0.5 mL of the Kenalog lidocaine solution  was injected.  Then the same procedure was repeated at the right L4 SAP  transverse process junction.  Once again, 22 gauge 3-1/2 inch spinal needle  inserted to bone contact and then Omnipaque 180 x 0.3 mL showed no  intravascular uptake and  0.5 mL of the Kenalog lidocaine solution was  injected.  Then the C-arm was obliqued toward the left side and the left L4  SAP transverse process junction was targeted.  Once again 22 gauge 3-1/2  inch needle was inserted to bone contact and then the Omnipaque solution  demonstrated no intravascular uptake with 0.3 mL injected.  Then 0.5 mL of  the Kenalog lidocaine solution were injected.  Finally, the L5 SAP  transverse process junction was targeted.  22 gauge 3-1/2 inch spinal needle  was inserted to bone contact and Omnipaque 180 demonstrated no intravascular  uptake with live fluoroscopy injection.  Then 0.5 mL of the Kenalog  lidocaine solution was injected.  The patient tolerated the procedure well.  Post injection instructions were given.  She is to follow up in three weeks.  If she got at least 50% relief of pain, even temporarily, ie, one to two  hours, a repeat injection will be performed.  We will give her Valium prior  to the next injection p.o. to relax her.  I will also try buffered lidocaine  as she experienced burning with the local anesthetic injection.      Andr   AEK/MEDQ  D:  09/14/2004 14:00:20  T:  09/14/2004 14:29:12  Job:  [id number]

## 2011-02-15 ENCOUNTER — Ambulatory Visit: Payer: Self-pay

## 2011-02-16 IMAGING — US ABDOMEN ULTRASOUND
1 series · 17 of 25 positions shown · non-contrast
Comparison: none

REASON FOR EXAM: epigastric/ruq pain
COMMENTS:

[Series 1: abdomen ultrasound · 17 of 46 slices shown]
[im 1/46]
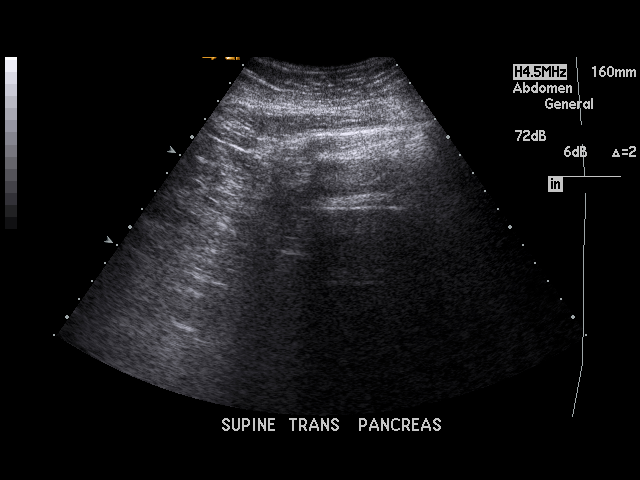
[im 4/46]
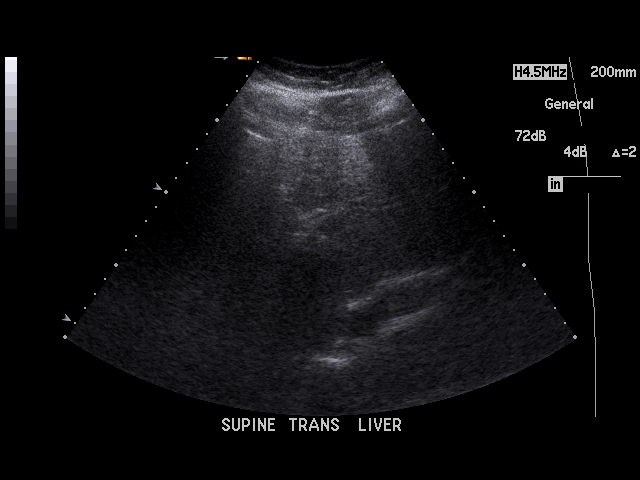
[im 6/46]
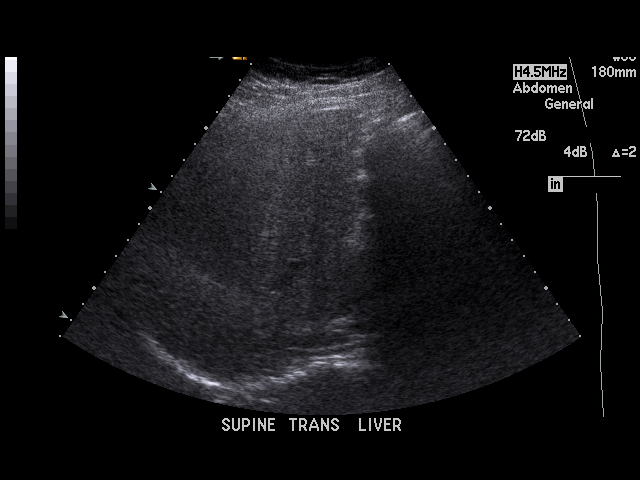
[im 10/46]
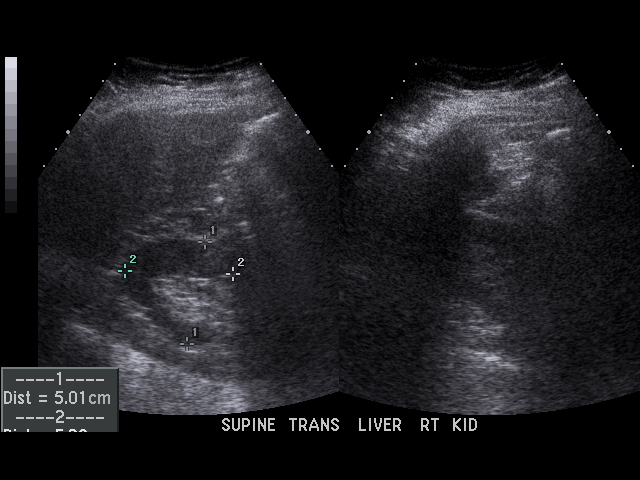
[im 12/46]
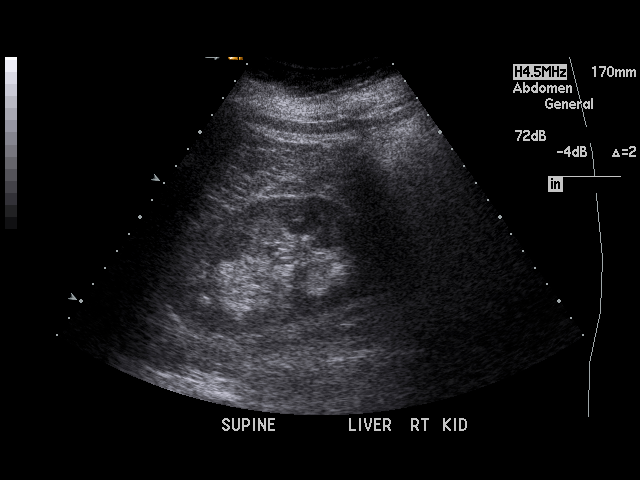
[im 16/46]
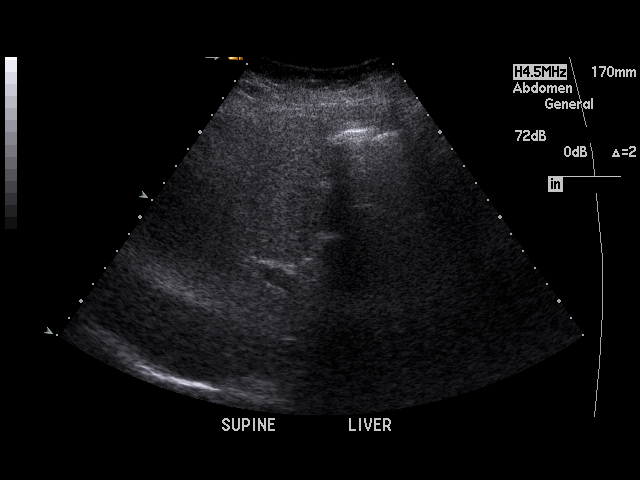
[im 17/46]
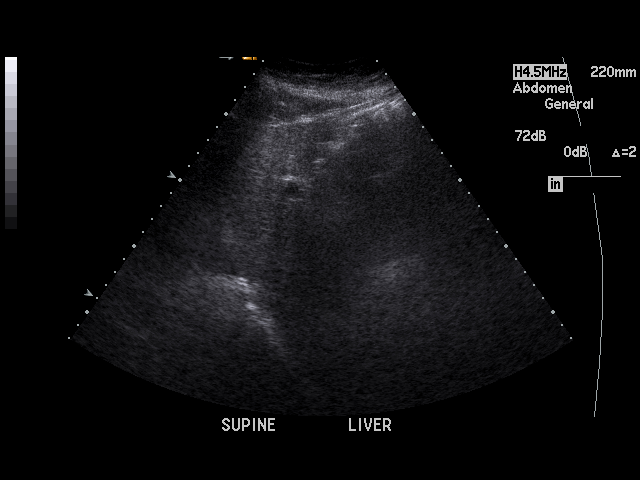
[im 21/46]
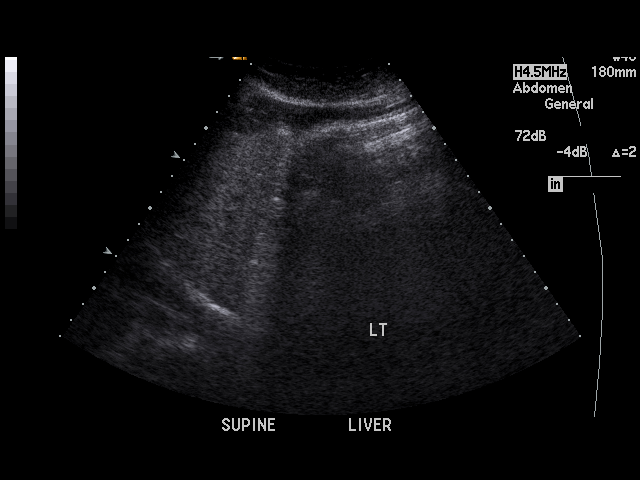
[im 23/46]
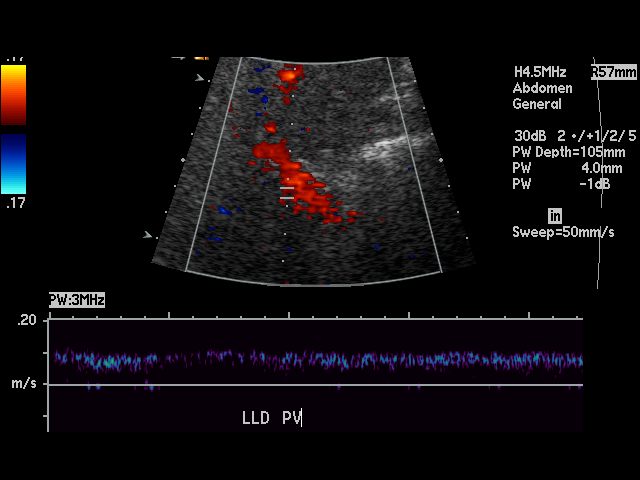
[im 25/46]
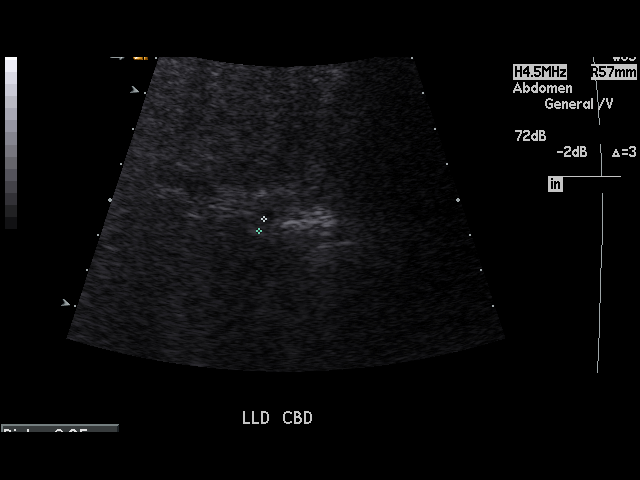
[im 29/46]
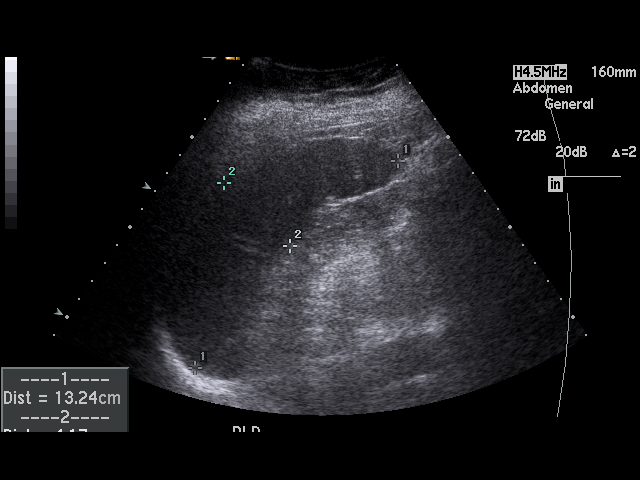
[im 31/46]
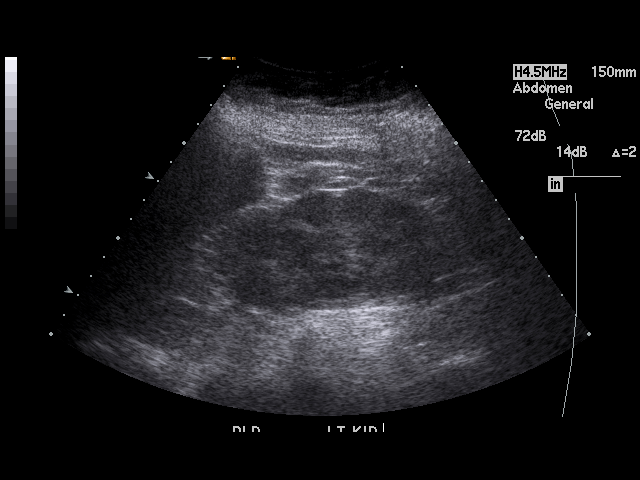
[im 34/46]
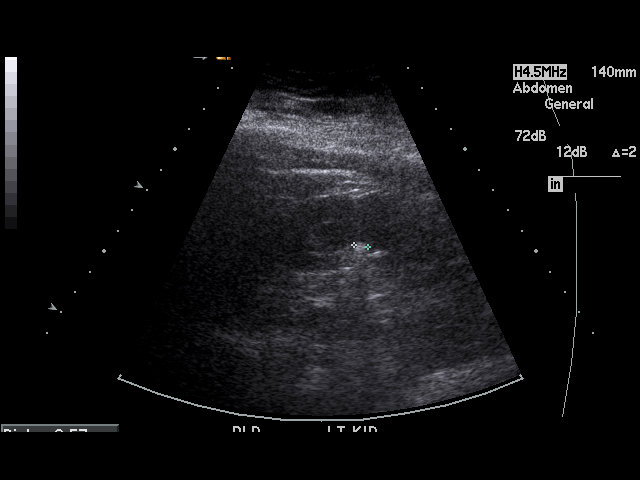
[im 36/46]
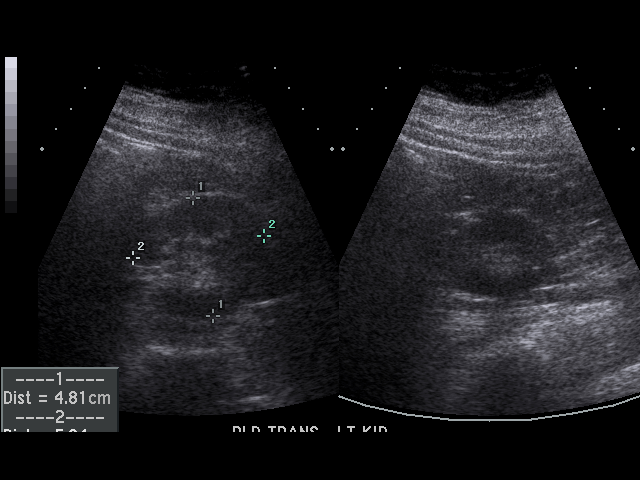
[im 40/46]
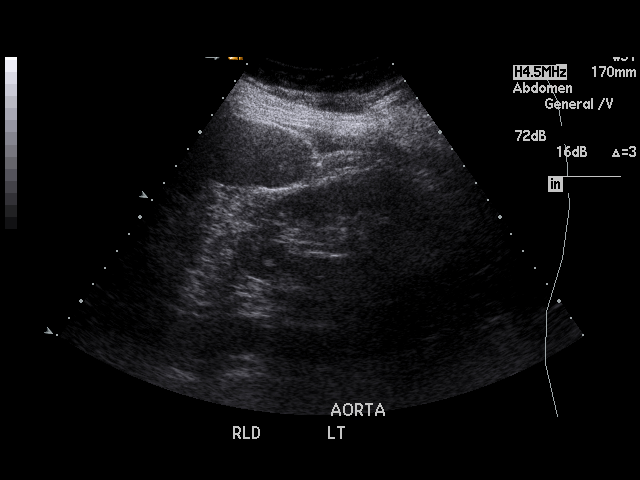
[im 42/46]
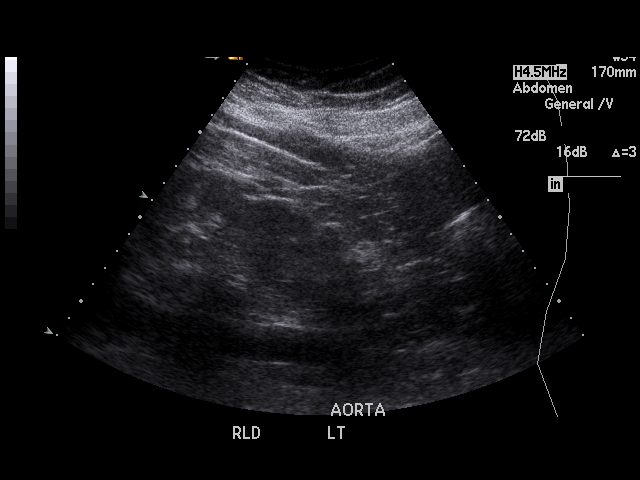
[im 46/46]
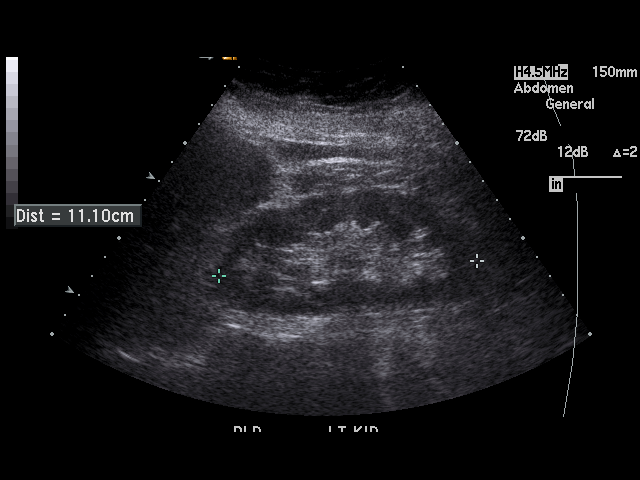

[17 of 25 positions shown; findings below may reference images not displayed]

PROCEDURE:     US  - US ABDOMEN GENERAL SURVEY  - March 25, 2009  [DATE]

RESULT:       The liver demonstrates a heterogeneous parenchymal pattern.
Hepatopetal flow is demonstrated within the portal vein.  The aorta and IVC
are not appreciated. The pancreas is not visualized. The patient is status
post cholecystectomy.  The common bile duct measures 3.7 mm in diameter.
The spleen measures 13.24 cm in longitudinal dimensions.  Evaluation of the
right and left kidneys demonstrates no evidence of hydronephrosis.  An area
of increased echogenicity projects within the left kidney mid to lower pole
region suspicious for a nonobstructing calculus. The right kidney measures
11.35 x 5.01 x 5.23 cm and the left 11.1 x 4.85 x 5.3 cm.  There is no
evidence of hydronephrosis.  No evidence of abdominal free fluid, drainable
loculated fluid collections or masses.
IMPRESSION: 1.     Findings consistent with hepatic steatosis. There is splenomegaly
which is mild and an area of cirrhotic change within the liver is also a
diagnostic consideration.
2.     Findings possibly representing a nonobstructing calculus within the
left kidney.
3.     Dr. Roveri of the Emergency Department was informed of these
findings at the time of the initial interpretation.

## 2011-03-10 IMAGING — CT CT ABD-PELV W/ CM
1 of 2 series · 15 of 32 positions shown, 19 images · non-contrast
Comparison: None

REASON FOR EXAM: (1) abd pain; (2) abd  Blain
COMMENTS:

PROCEDURE:     CT  - CT ABDOMEN / PELVIS  W  - April 16, 2009  [DATE]
RESULT:     History: Abdominal pain
TECHNIQUE: Multiple axial images of the abdomen and pelvis were performed
from the lung bases to the pubic symphysis, with a p.o. contrast and with
100 mL of Fsovue-L99 intravenous contrast.

[Series 2: abdomen · axial · 0.79mm/px · z∈[-340,+110]mm · 15 of 100 slices shown, 19 images]
[im 5/100  soft-tissue]
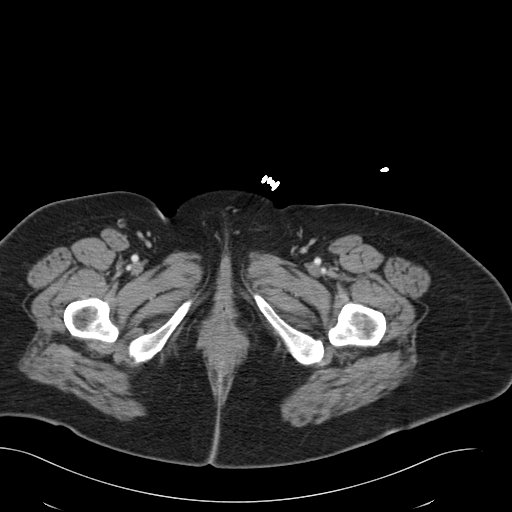
[im 5/100  bone]
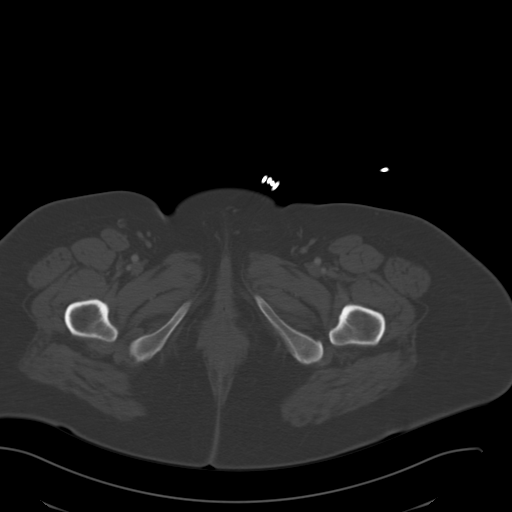
[im 13/100  soft-tissue]
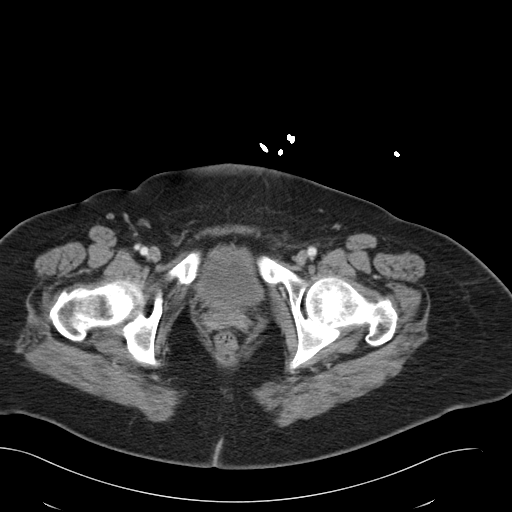
[im 21/100  soft-tissue]
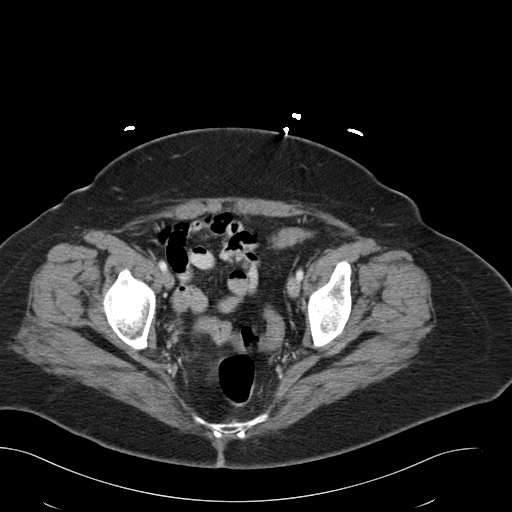
[im 29/100  soft-tissue]
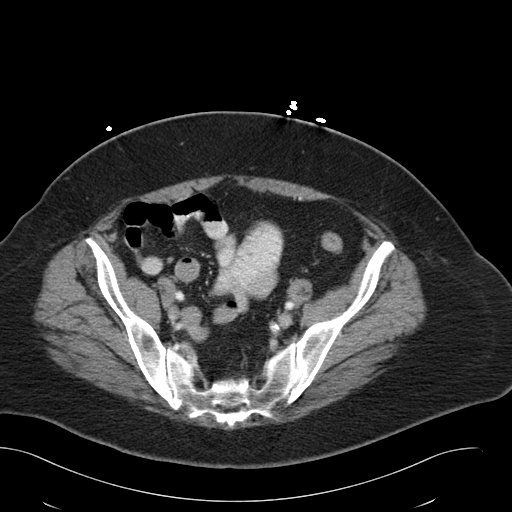
[im 34/100  soft-tissue]
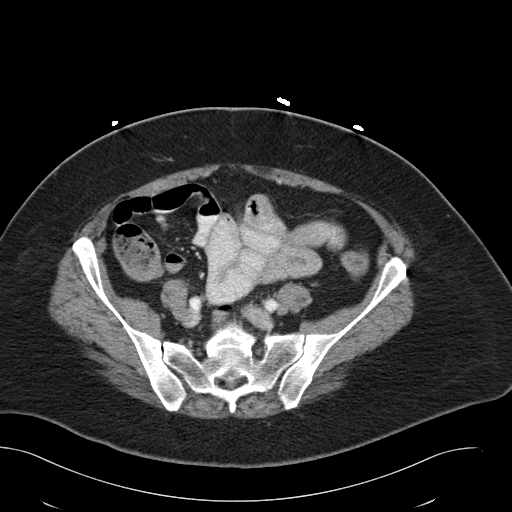
[im 42/100  soft-tissue]
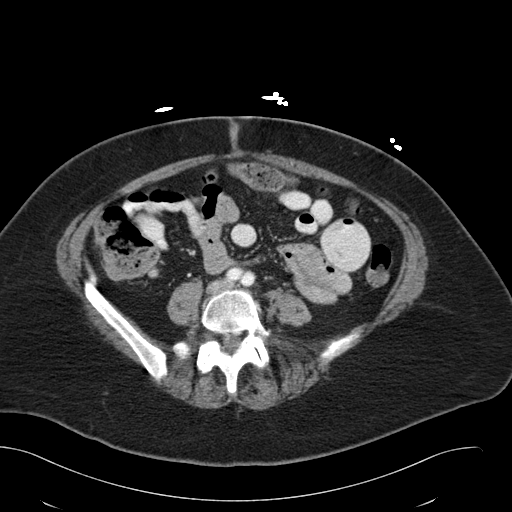
[im 50/100  soft-tissue]
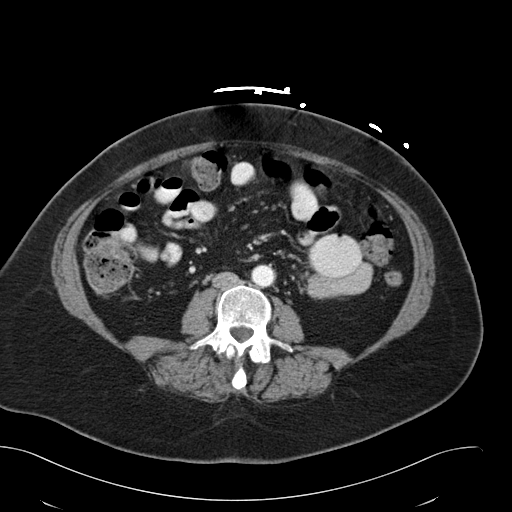
[im 58/100  soft-tissue]
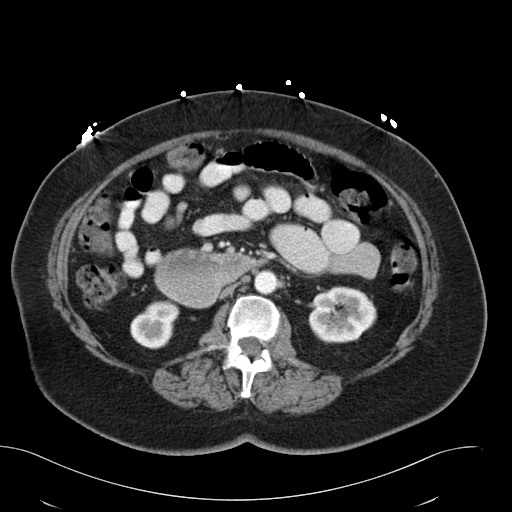
[im 67/100  soft-tissue]
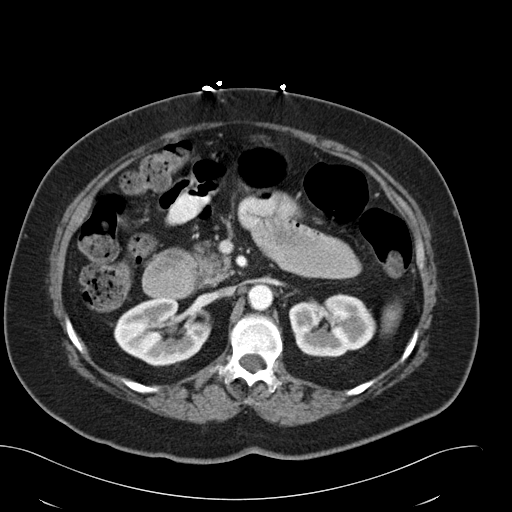
[im 67/100  bone]
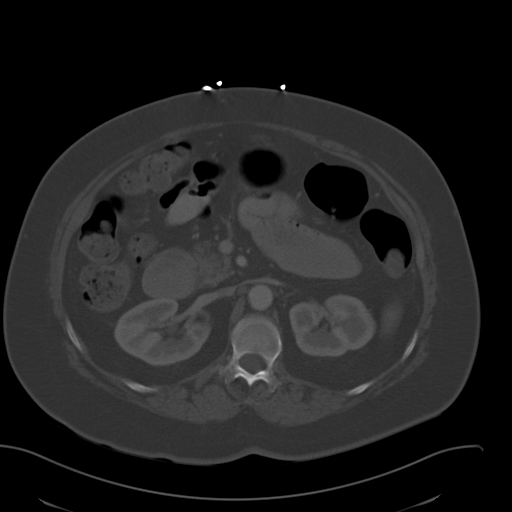
[im 71/100  soft-tissue]
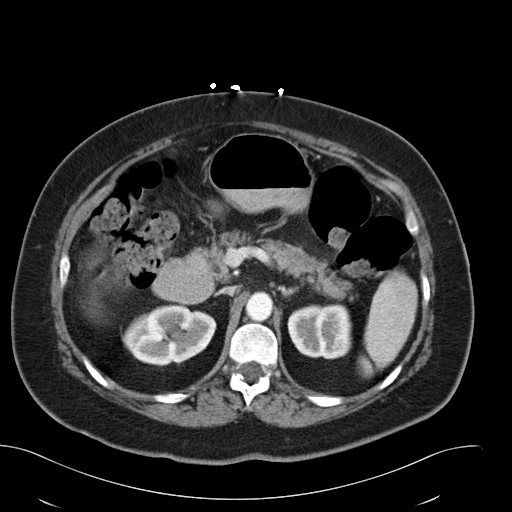
[im 79/100  soft-tissue]
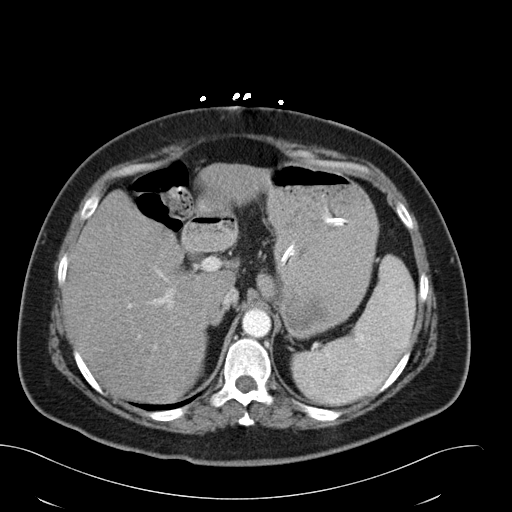
[im 83/100  lung]
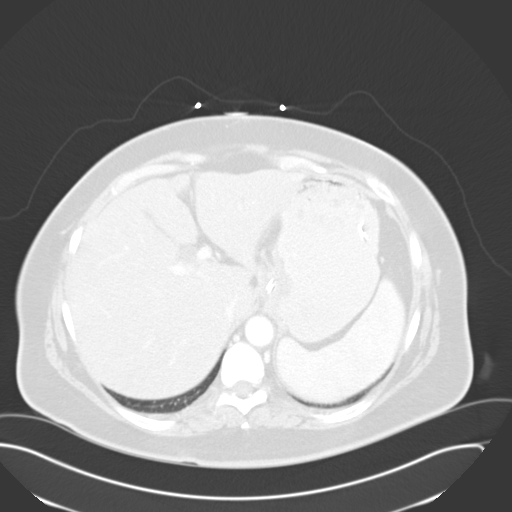
[im 87/100  soft-tissue]
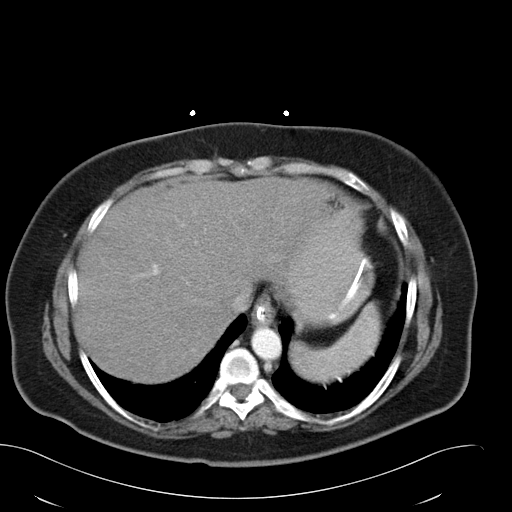
[im 87/100  lung]
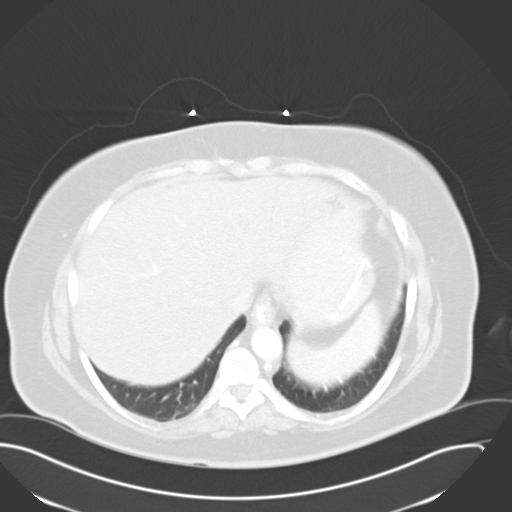
[im 91/100  lung]
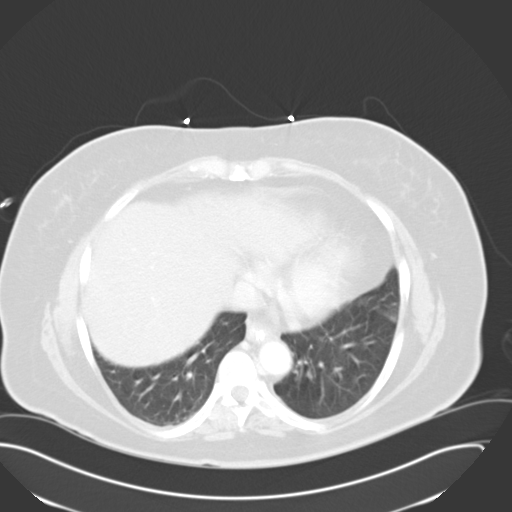
[im 95/100  soft-tissue]
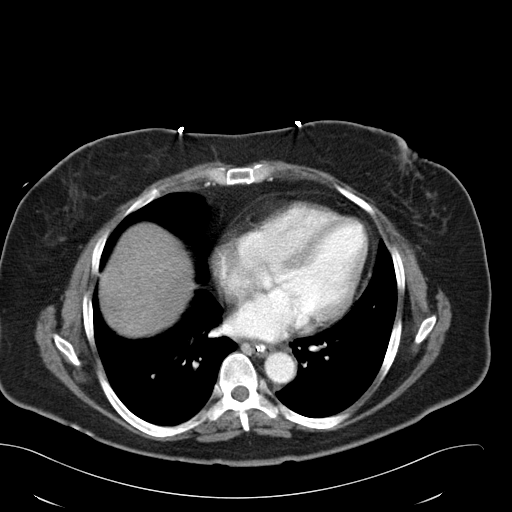
[im 95/100  lung]
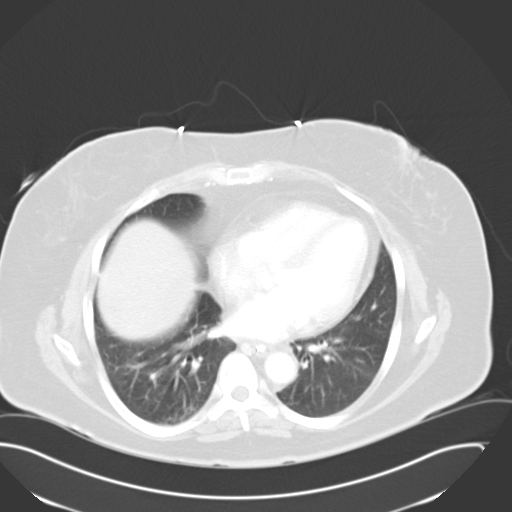

[15 of 32 positions shown; findings below may reference images not displayed]

FINDINGS: The lung bases are clear. There is no pneumothorax. The heart size is
normal.

The liver demonstrates no focal abnormality. There is no intrahepatic or
extrahepatic biliary ductal dilatation. The gallbladder is surgically
absent. The spleen demonstrates no focal abnormality. The kidneys, adrenal
glands, and pancreas are normal. The bladder is unremarkable.

There is a nasogastric tube within the stomach. There is nonspecific
distention of the descending portion of the duodenum, but there is no
obstruction to the transit of contrast through this region. The stomach,
small intestine, and large intestine demonstrate no contrast extravasation
or dilatation. There is no pneumoperitoneum, pneumatosis, or portal venous
gas. There is no abdominal or pelvic free fluid. There is no
lymphadenopathy.

The abdominal aorta is normal in caliber.

The osseous structures are unremarkable.
IMPRESSION: There is nonspecific distention of the descending portion of the duodenum,
but there is no obstruction to the transit of contrast through this region.
Otherwise no acute abdominal or pelvic pathology.

## 2011-03-11 IMAGING — CR DG ABDOMEN 3V
1 series · 4 of 4 positions shown · non-contrast
Comparison: none

REASON FOR EXAM: Follow up Ileus
COMMENTS:

PROCEDURE:     DXR - DXR ABDOMEN COMPLETE  - April 17, 2009  [DATE]
RESULT:     Comparisons:  04/16/2009, CT abdomen 04/16/2009

[Series 1: view not recorded · 0.17mm/px · 4 of 4 slices shown]
[im 1/4]
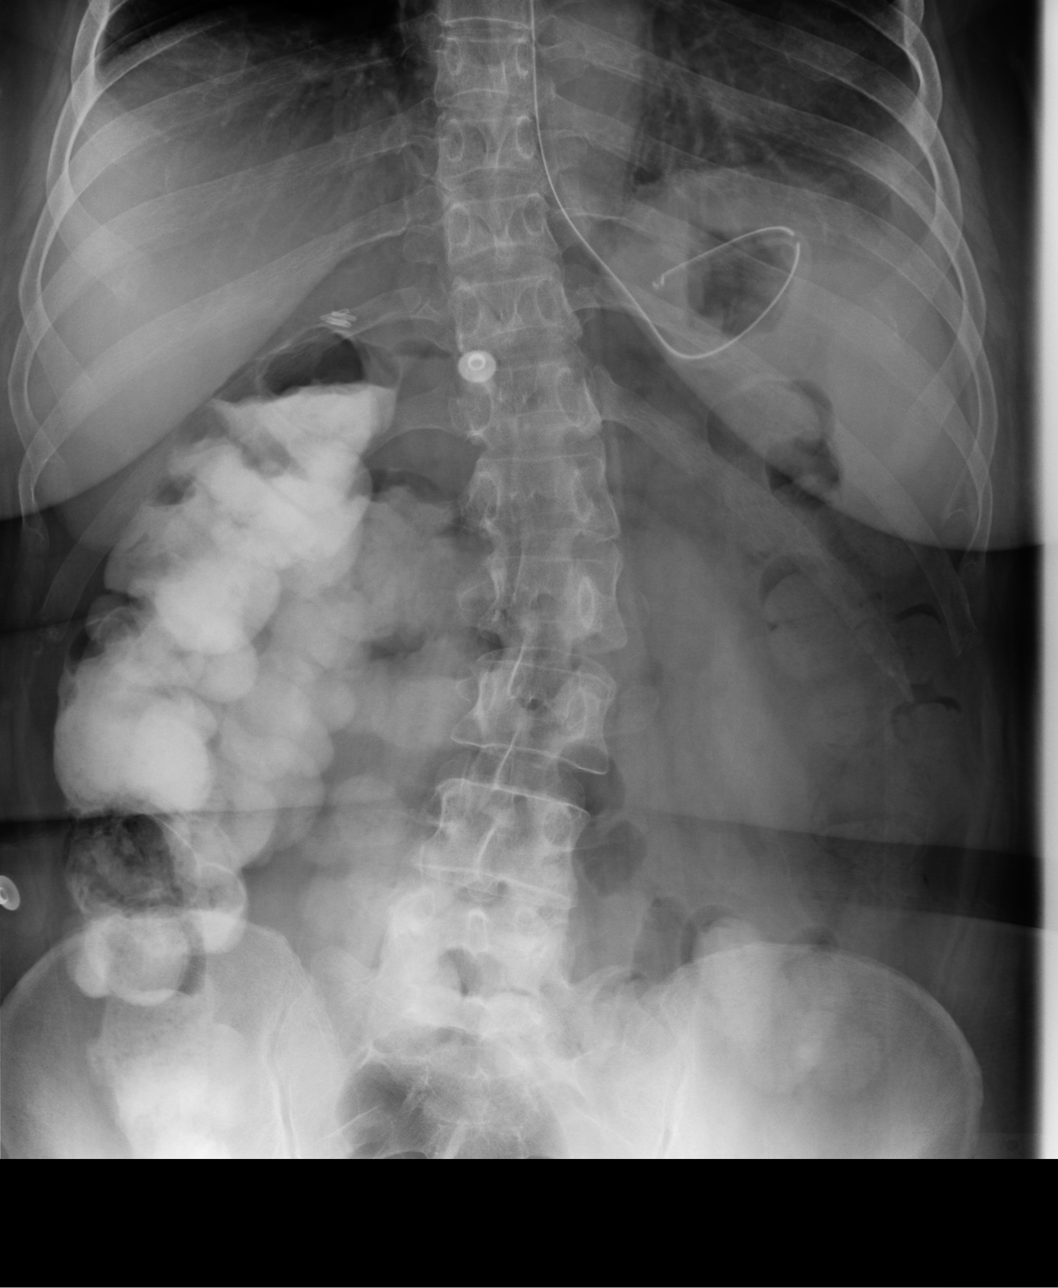
[im 2/4]
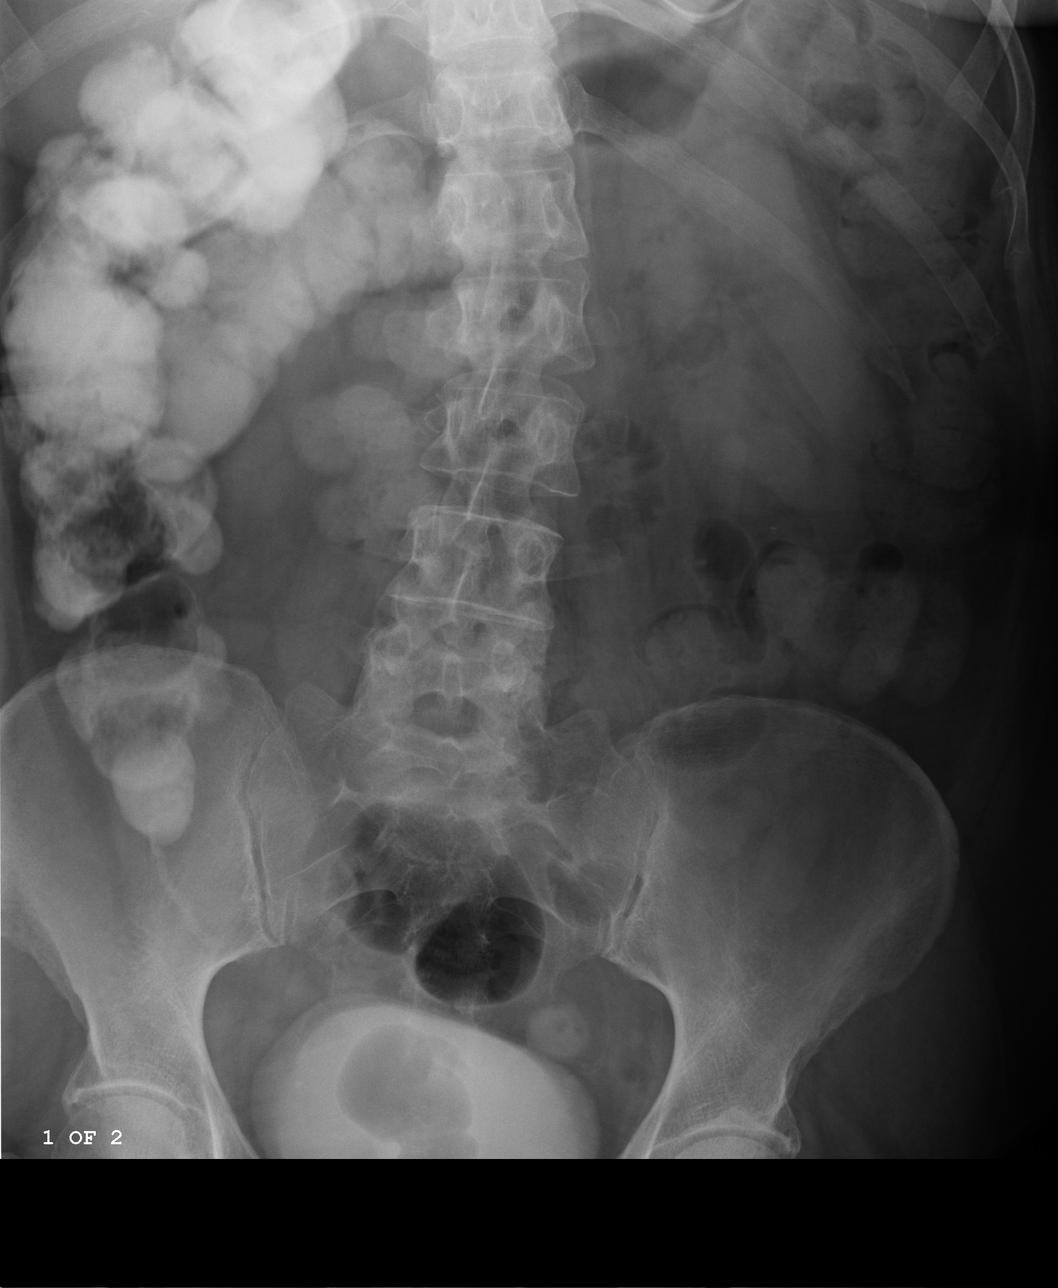
[im 3/4]
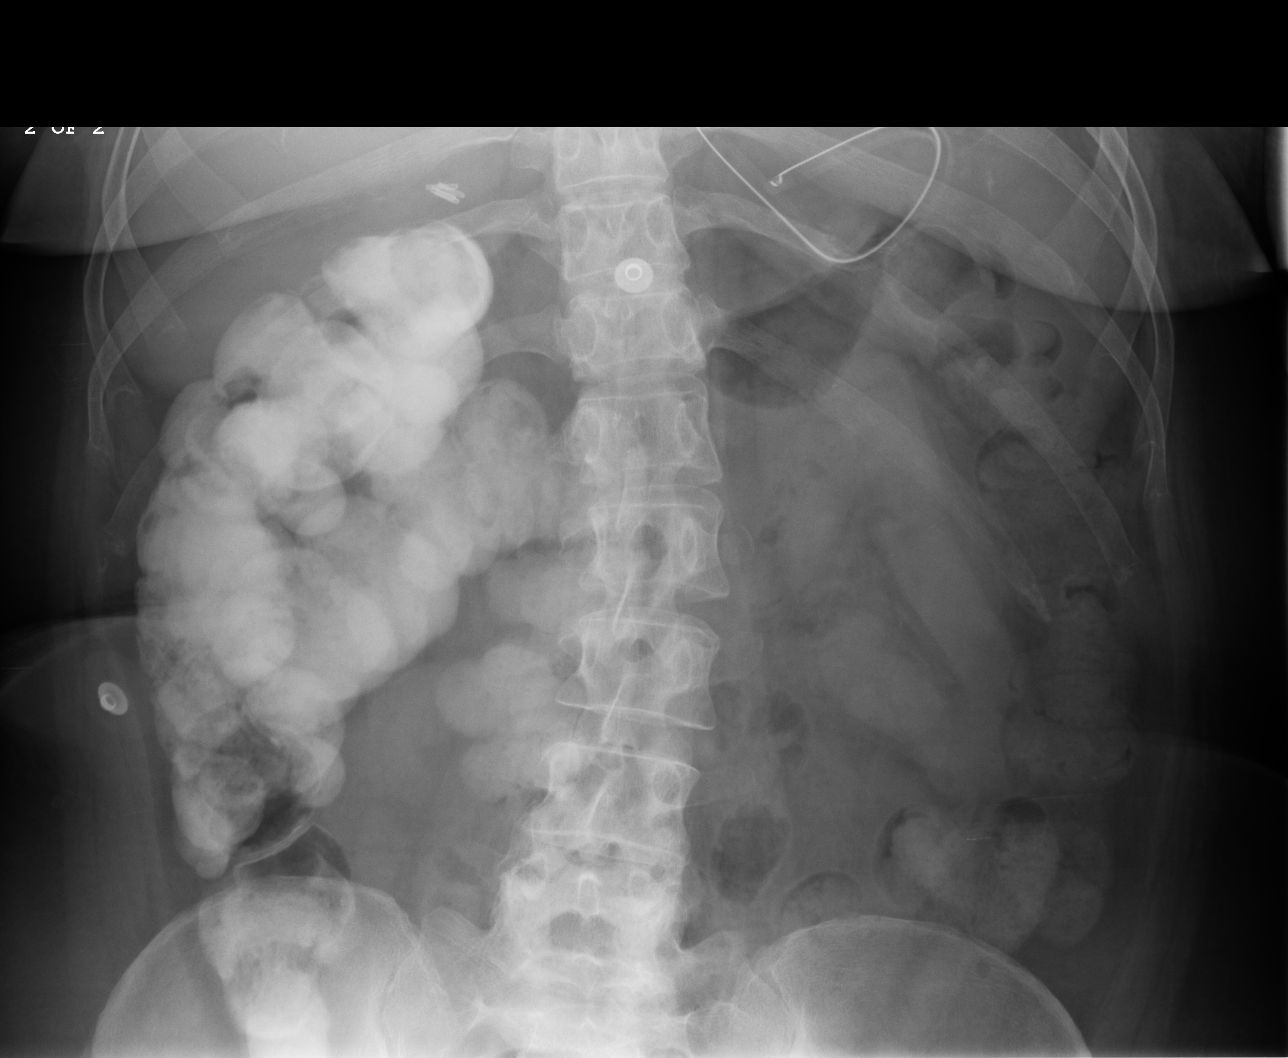
[im 4/4]
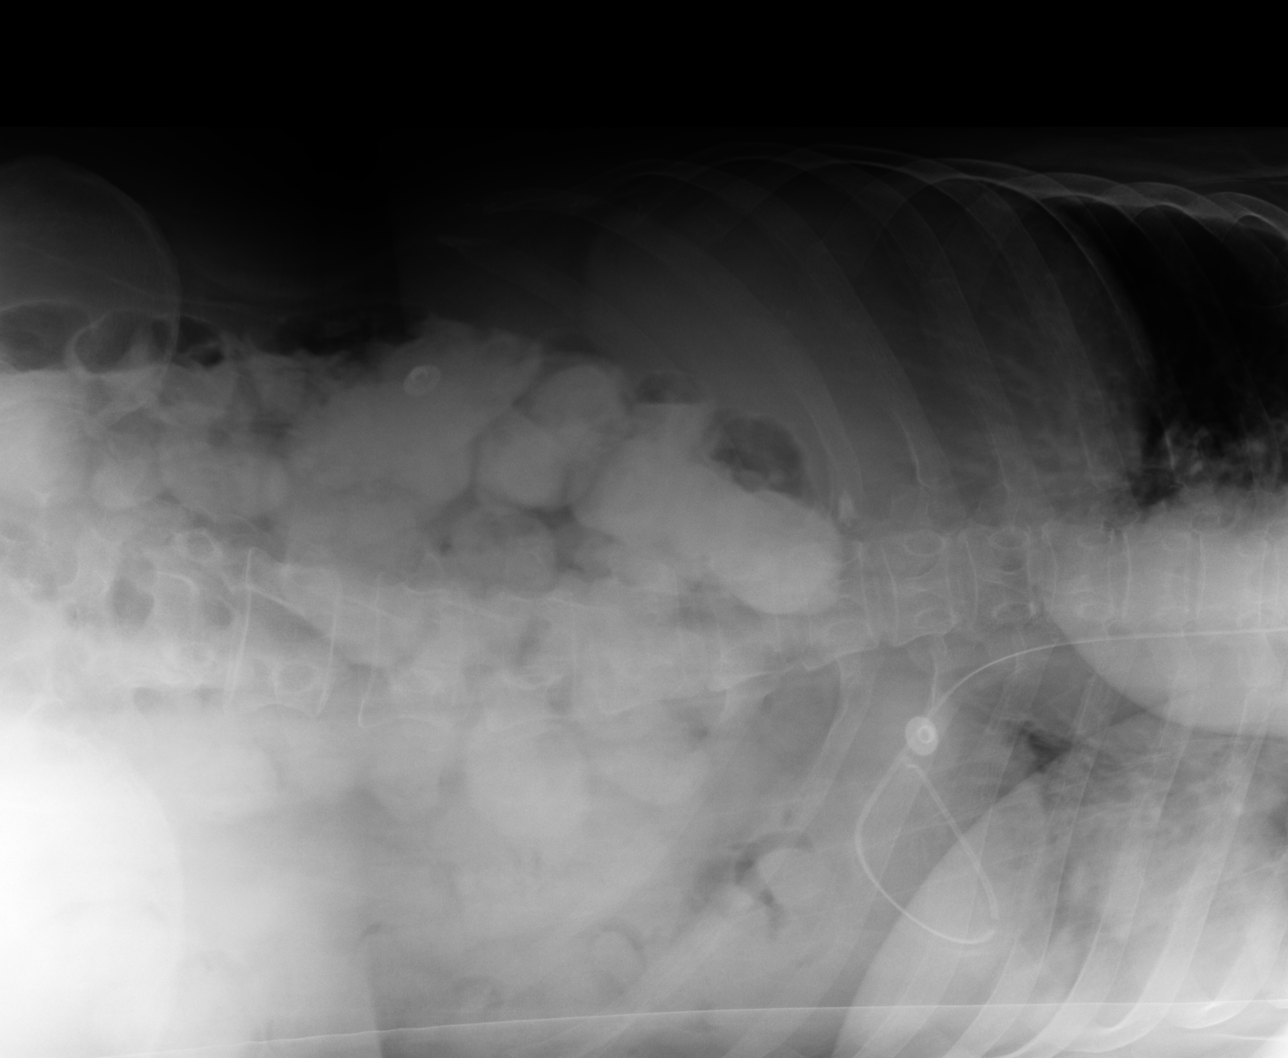

[4 of 4 positions shown; findings below may reference images not displayed]

FINDINGS: Supine and upright views of the abdomen are provided.

There is a nonspecific bowel gas pattern. There is no bowel dilatation to
suggest obstruction. There are no air-fluid levels. There is no pathologic
calcification along the expected course of the ureters. There is no evidence
of pneumoperitoneum, portal venous gas, or pneumatosis. Contrast
administered for the CT of the abdomen and pelvis on 04/16/2009 is present
within the descending and transverse colon. There is a nasogastric tube with
the distal portion coiled within a decompressed stomach. There is a rounded
area of contrast in the left lower pelvis which may resent contrast within a
small bowel loop or within a diverticulum.

The osseous structures are unremarkable.
IMPRESSION: Unremarkable abdominal radiograph.

## 2011-03-18 IMAGING — CT CT MAXILLOFACIAL WITH CONTRAST
1 series · 16 of 30 positions shown, 20 images · non-contrast
Comparison: none

REASON FOR EXAM: chin  redness, edema, tenderness
COMMENTS:

PROCEDURE:     CT  - CT MAXILLOFACIAL AREA W  - April 24, 2009  [DATE]
RESULT:     Comparison: No comparison
TECHNIQUE: Multiple axial images obtained of the maxillofacial bones with
coronal reformatted images provided.

[Series 2: orbits osteo · axial · 0.35mm/px · z∈[-174,-12]mm · 16 of 60 slices shown, 20 images]
[im 3/60  brain]
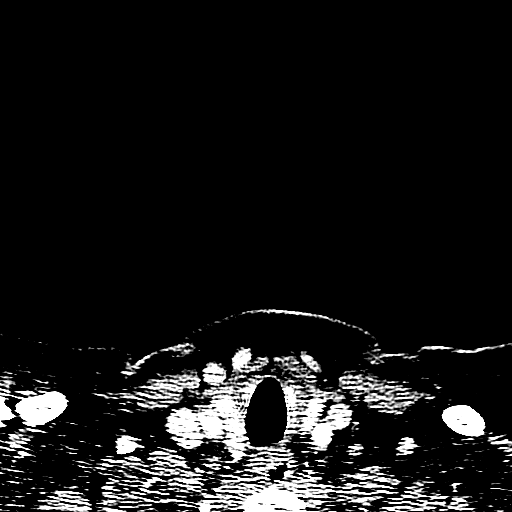
[im 3/60  bone]
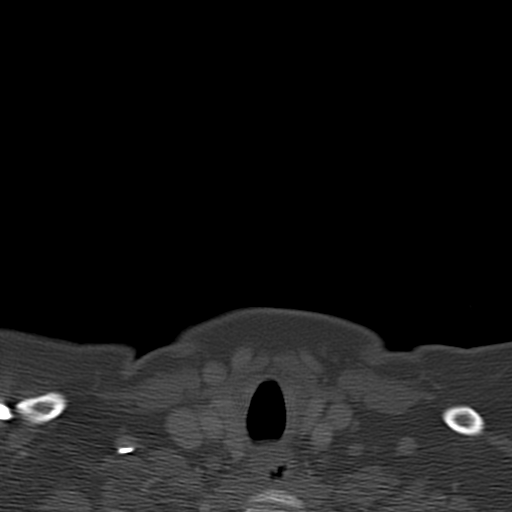
[im 7/60  bone]
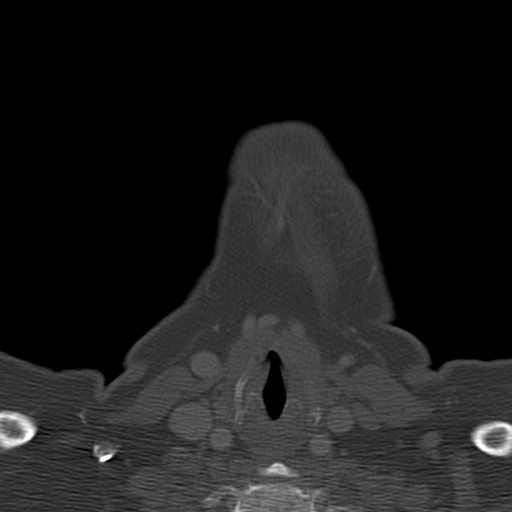
[im 11/60  bone]
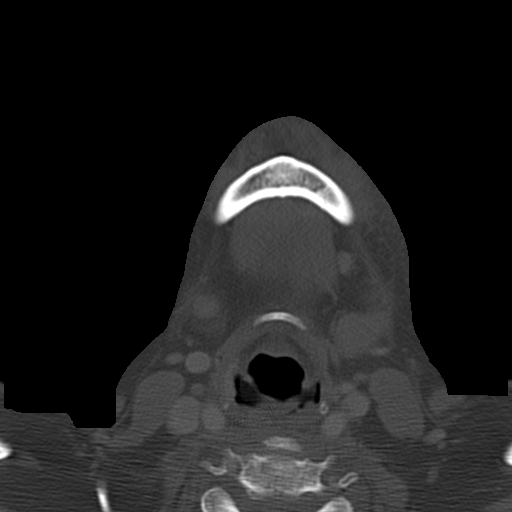
[im 15/60  bone]
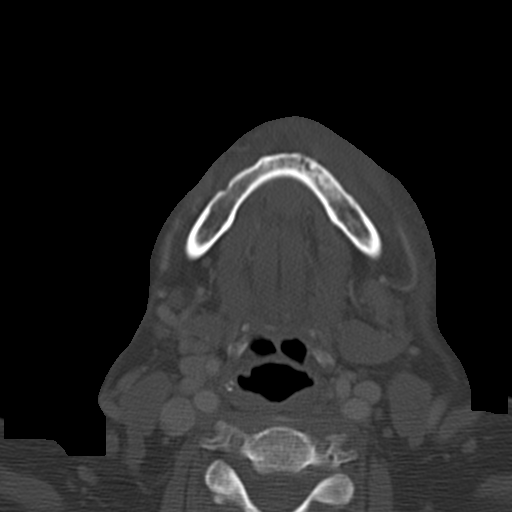
[im 17/60  brain]
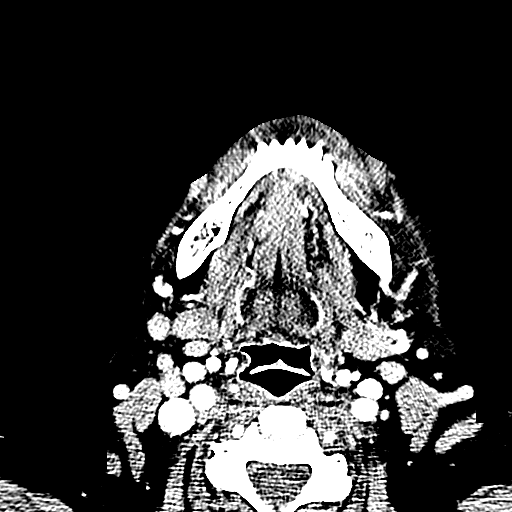
[im 17/60  bone]
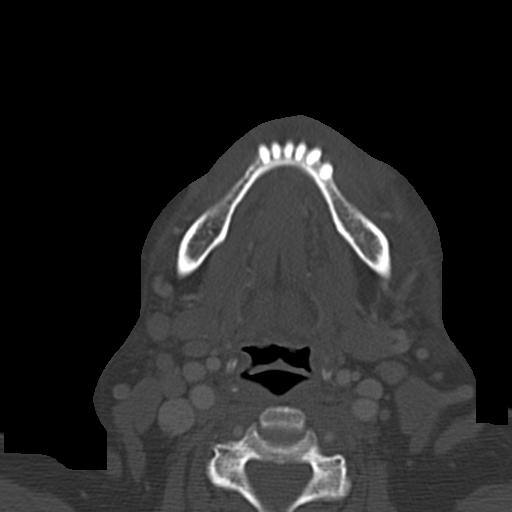
[im 21/60  bone]
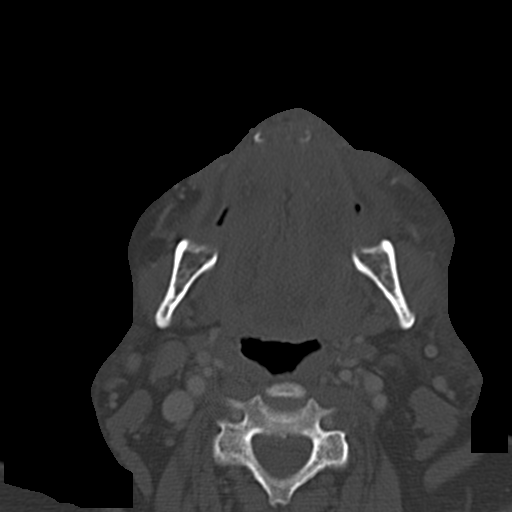
[im 25/60  bone]
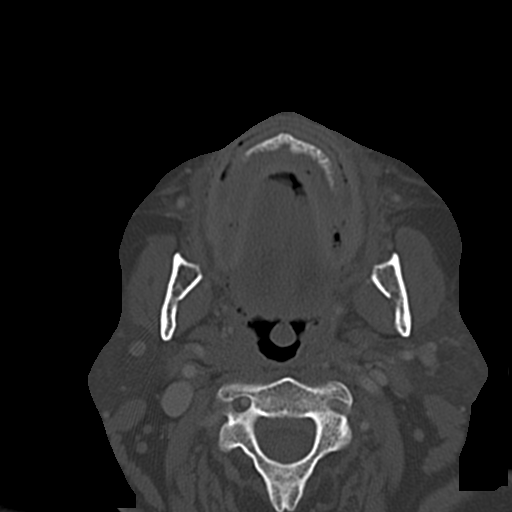
[im 29/60  bone]
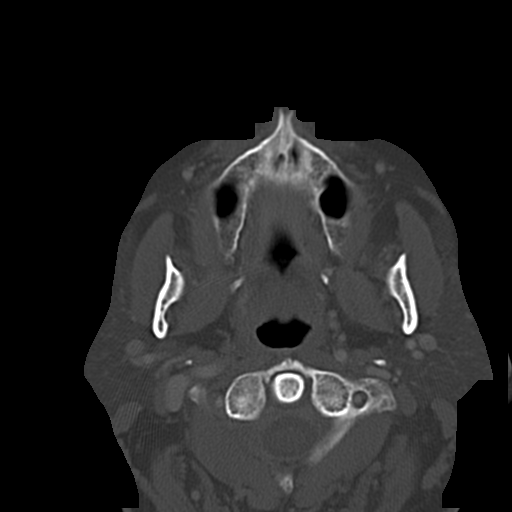
[im 31/60  brain]
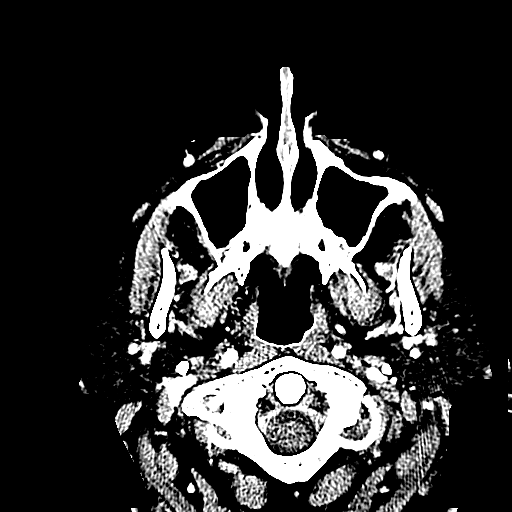
[im 31/60  bone]
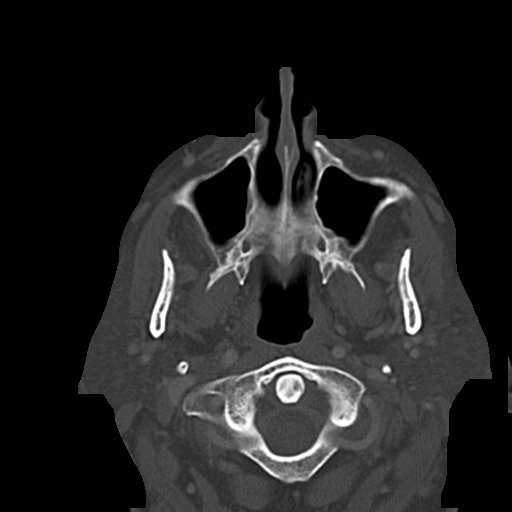
[im 35/60  bone]
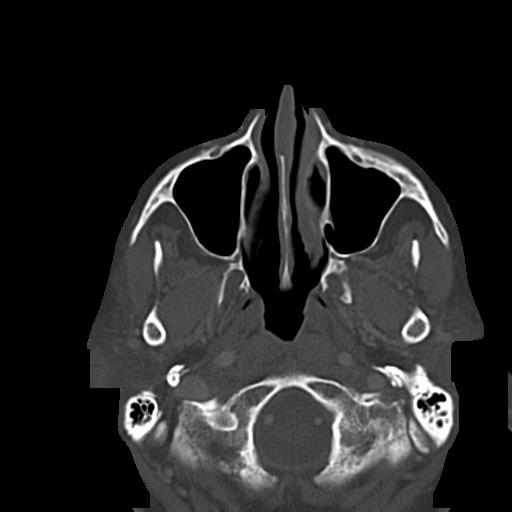
[im 39/60  bone]
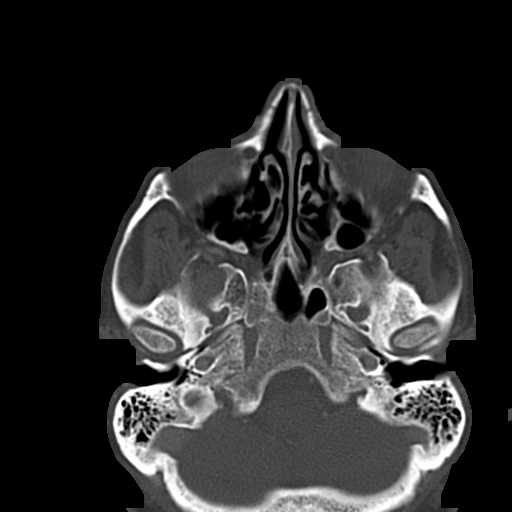
[im 43/60  bone]
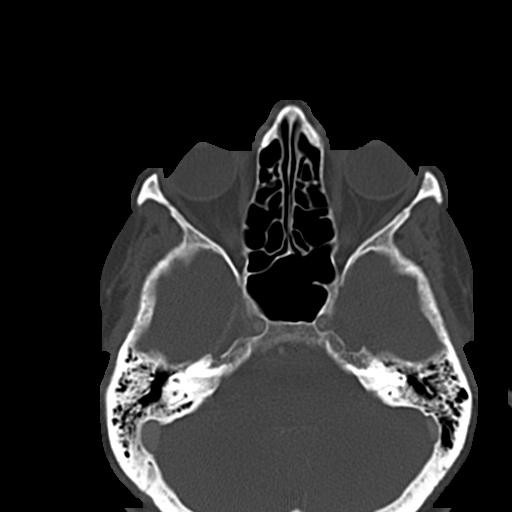
[im 45/60  brain]
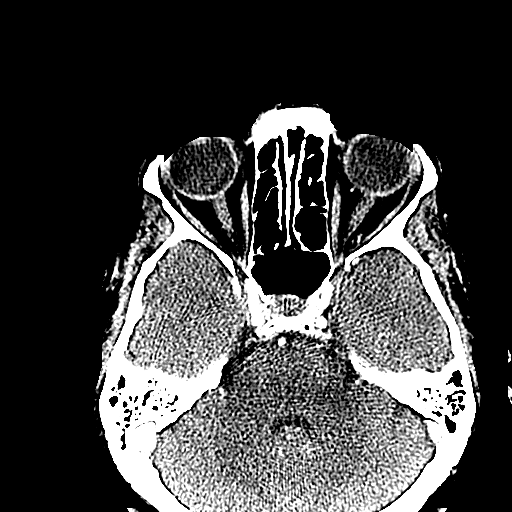
[im 45/60  bone]
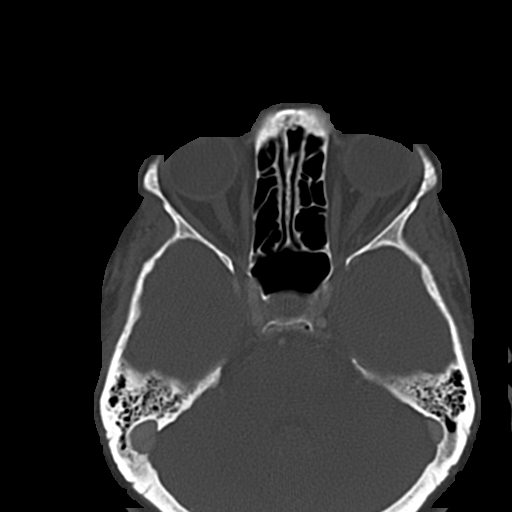
[im 49/60  bone]
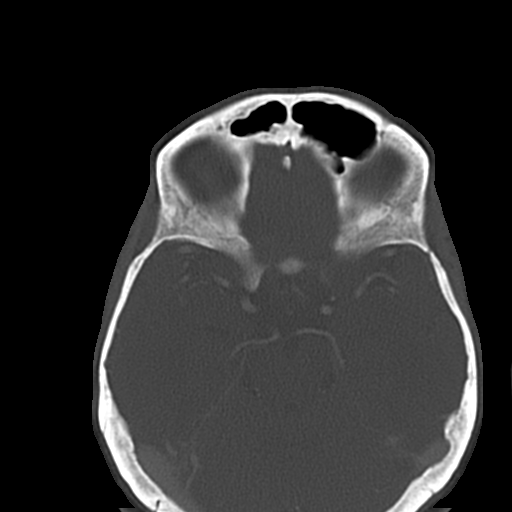
[im 53/60  bone]
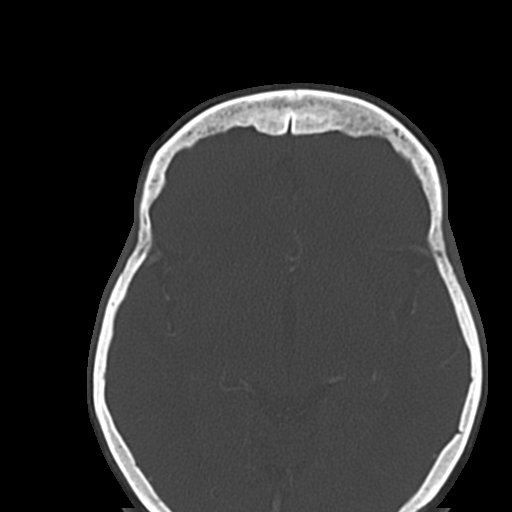
[im 57/60  bone]
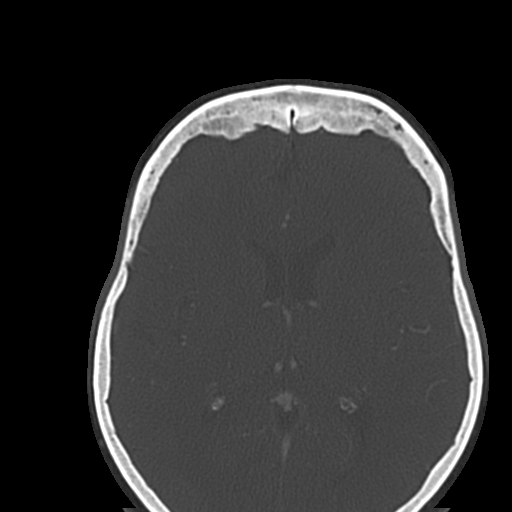

[16 of 30 positions shown; findings below may reference images not displayed]

FINDINGS: There is soft tissue swelling off the chin without a focal fluid collection.

The globes are intact. The orbital walls are intact. The orbital floor is
intact. The maxilla and mandible are intact. The zygomatic arches are
intact. The nasal septum is midline. There is no nasal bone fracture. The
temporomandibular joints are normal. The paranasal sinuses are clear. The
visualized portions of the mastoid sinuses are well aerated.
IMPRESSION: No acute osseous injury of the maxillofacial bones.

## 2011-03-18 IMAGING — CR DG CHEST 2V
1 series · 2 of 2 positions shown · non-contrast
Comparison: none

REASON FOR EXAM: ams
COMMENTS:

PROCEDURE:     DXR - DXR CHEST PA (OR AP) AND LATERAL  - April 24, 2009  [DATE]
RESULT:     Comparison: 01/01/2009

[Series 1: view not recorded · 0.17mm/px · 2 of 2 slices shown]
[im 1/2]
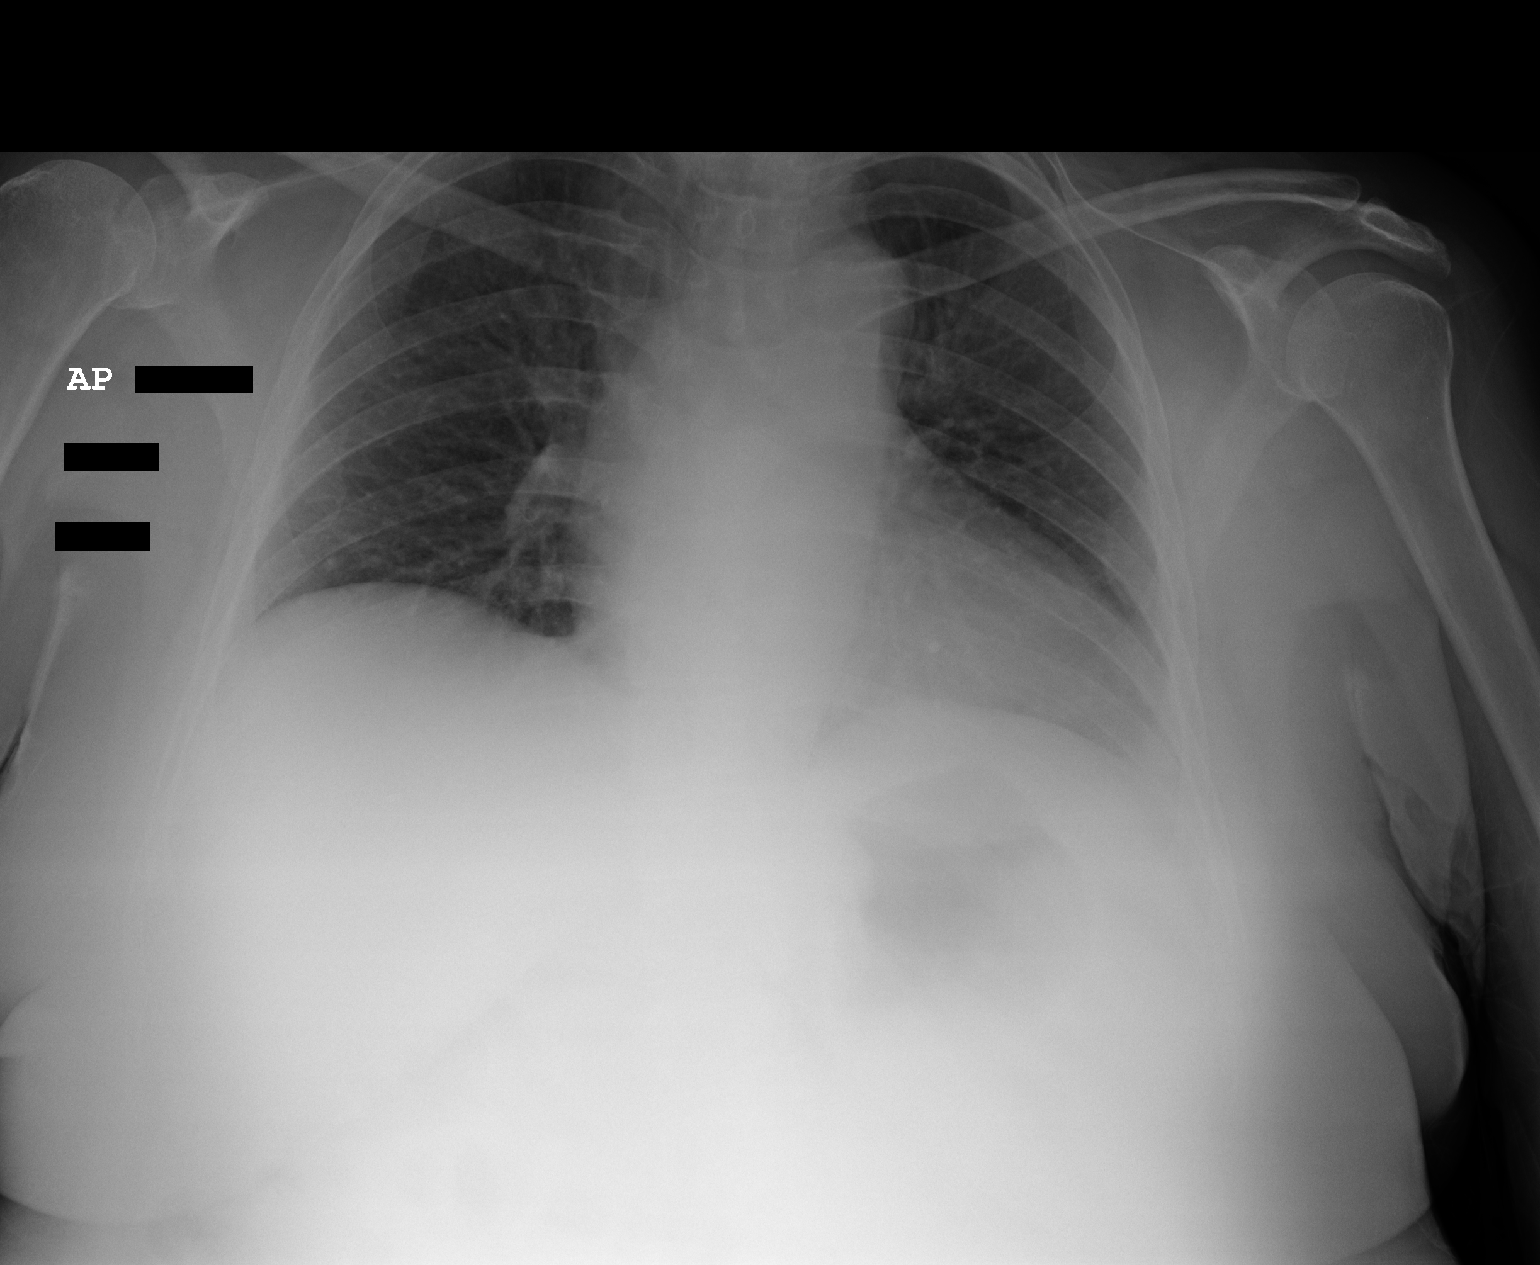
[im 2/2]
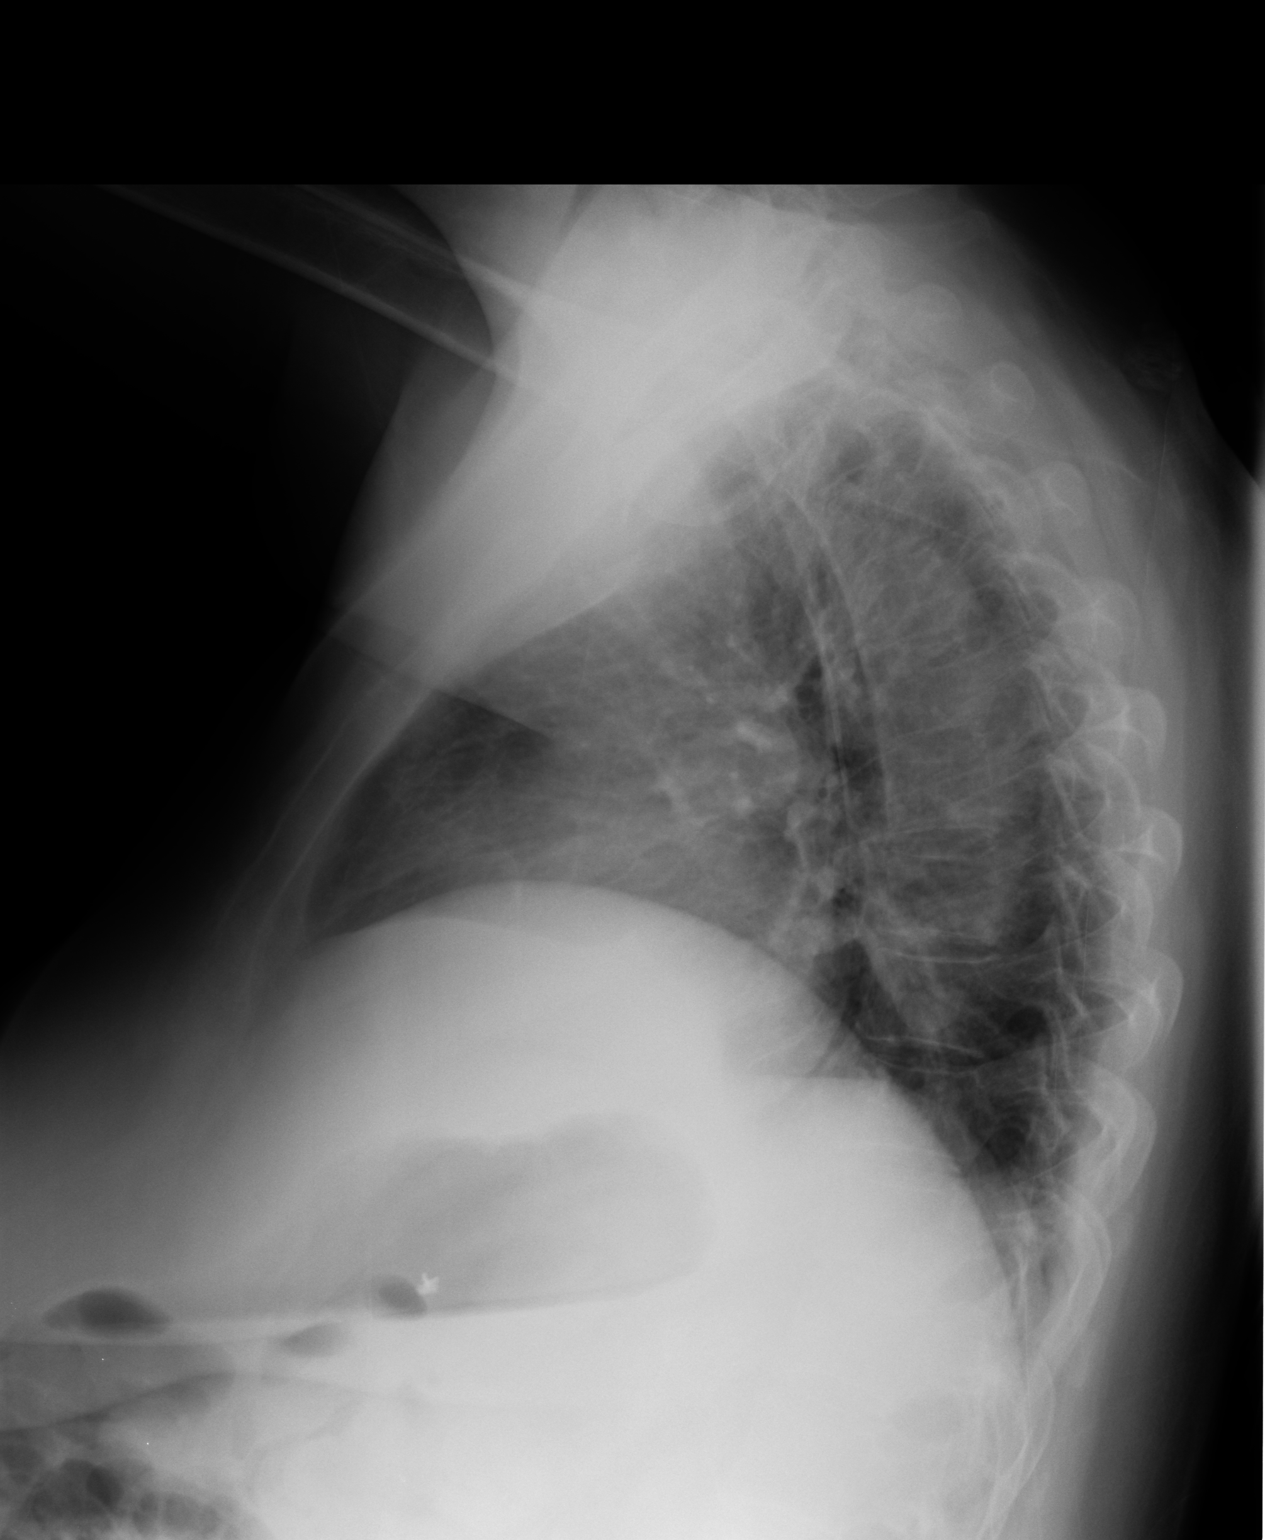

[2 of 2 positions shown; findings below may reference images not displayed]

FINDINGS: PA and lateral chest radiographs are provided. There is no focal parenchymal
opacity, pleural effusion, or pneumothorax. The heart and mediastinum are
stable. The osseous structures are unremarkable.
IMPRESSION: No acute disease of the chest.

## 2011-03-18 IMAGING — CT CT HEAD WITHOUT CONTRAST
2 series · 16 of 30 positions shown, 20 images · non-contrast
Comparison: none

REASON FOR EXAM: ams
COMMENTS:

PROCEDURE:     CT  - CT HEAD WITHOUT CONTRAST  - April 24, 2009  [DATE]
RESULT:     Comparison:  01/01/2009
TECHNIQUE: Multiple axial images from the foramen magnum to the vertex were
obtained without IV contrast.

[Series 2: without · axial · non-contrast · 0.42mm/px · z∈[-176,-56]mm · 13 of 30 slices shown, 17 images]
[im 3/30  brain]
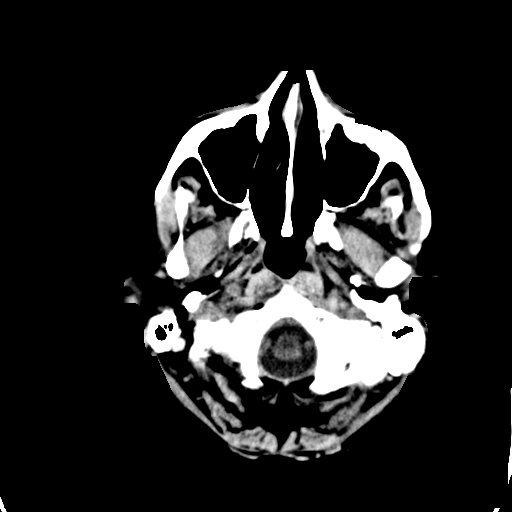
[im 3/30  bone]
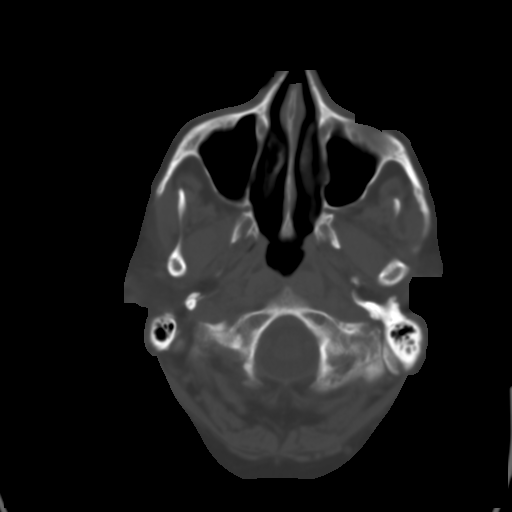
[im 5/30  brain]
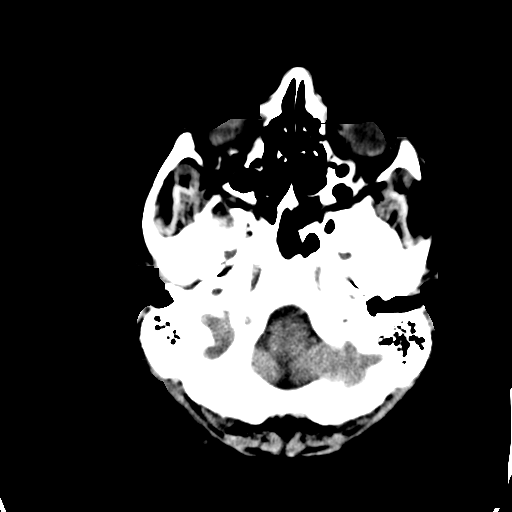
[im 7/30  brain]
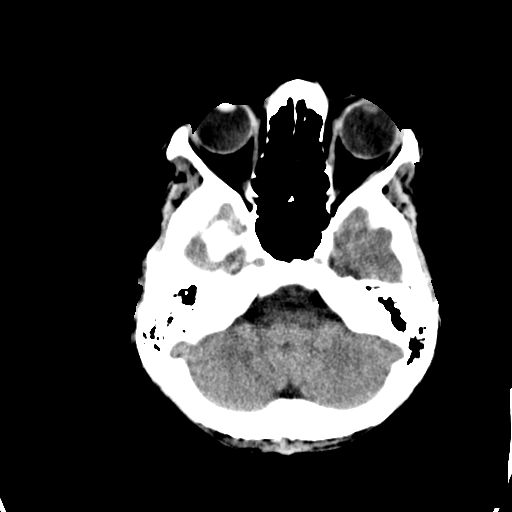
[im 9/30  brain]
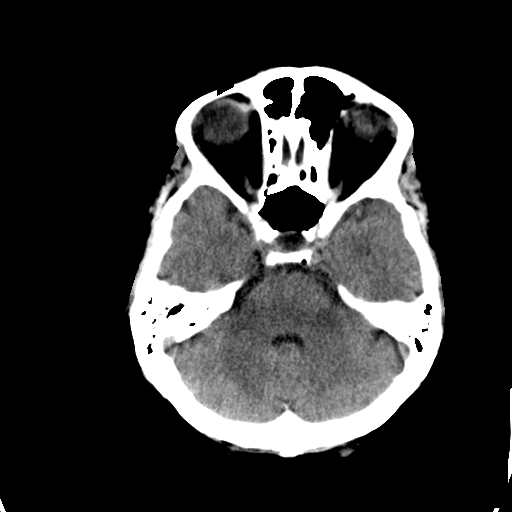
[im 11/30  brain]
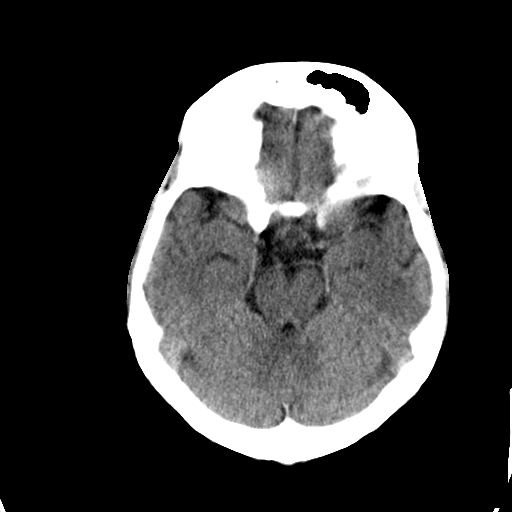
[im 11/30  bone]
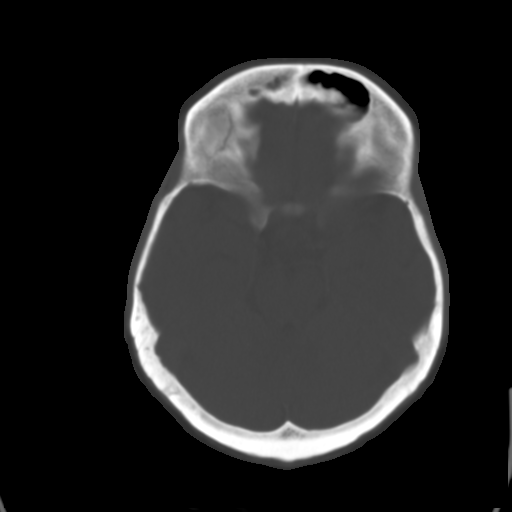
[im 13/30  brain]
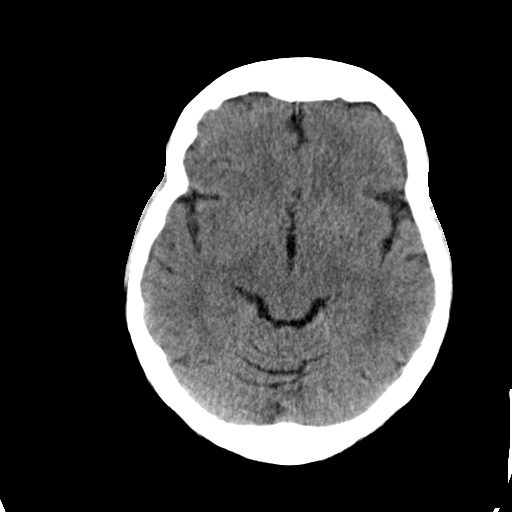
[im 15/30  brain]
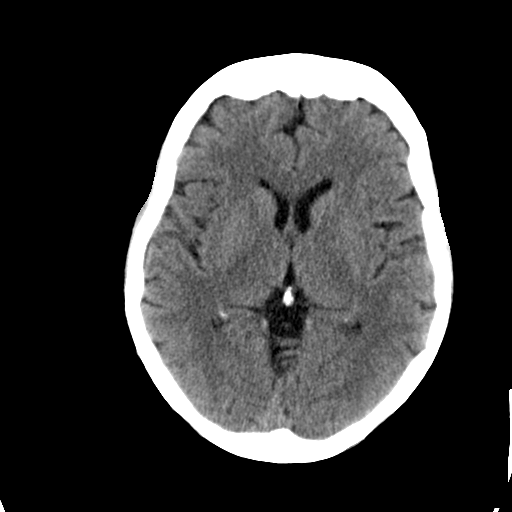
[im 17/30  brain]
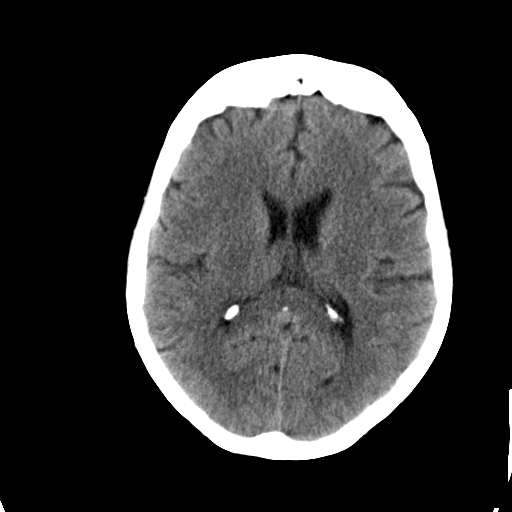
[im 19/30  brain]
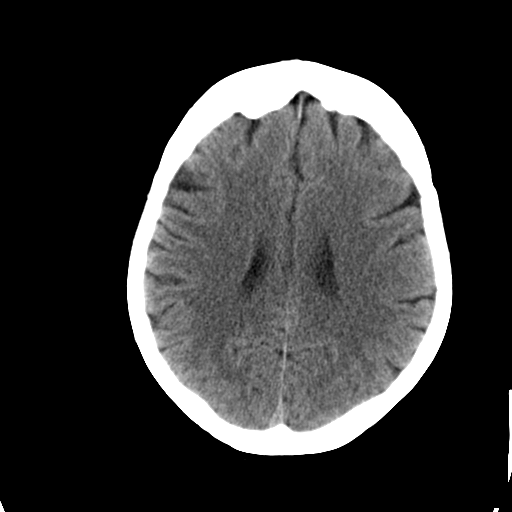
[im 19/30  bone]
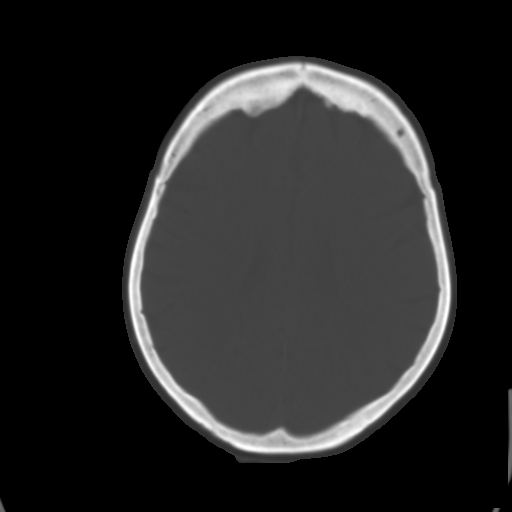
[im 21/30  brain]
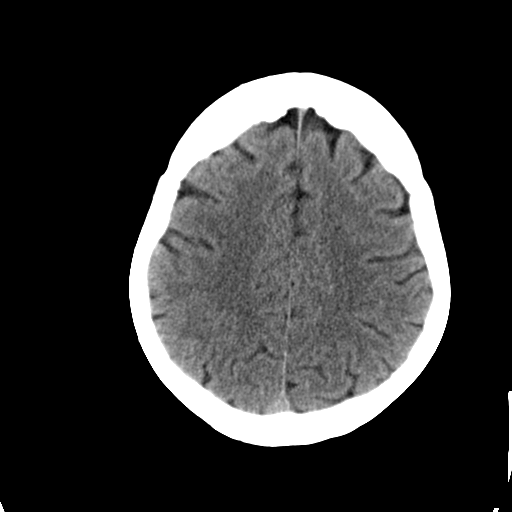
[im 23/30  brain]
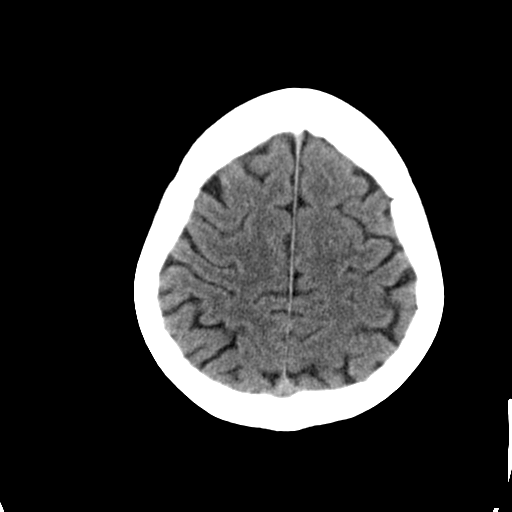
[im 25/30  brain]
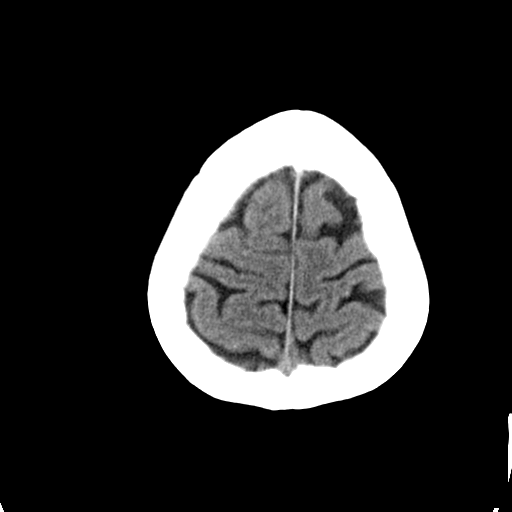
[im 27/30  brain]
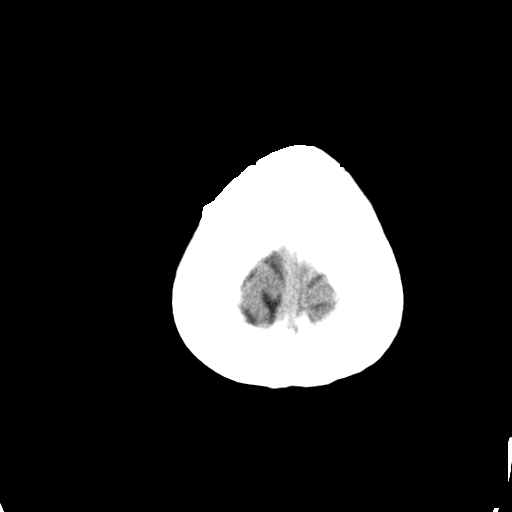
[im 27/30  bone]
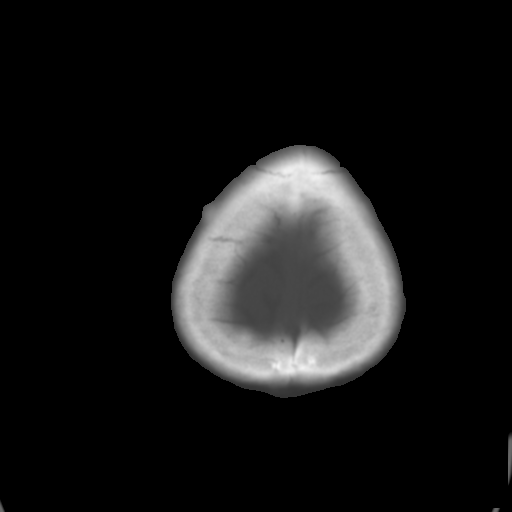

[Series 3: bone · axial · 0.42mm/px · z∈[-176,-136]mm · 3 of 30 slices shown]
[im 3/30  bone]
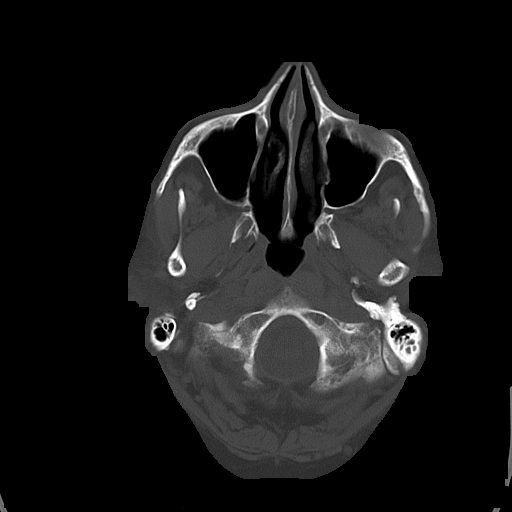
[im 7/30  bone]
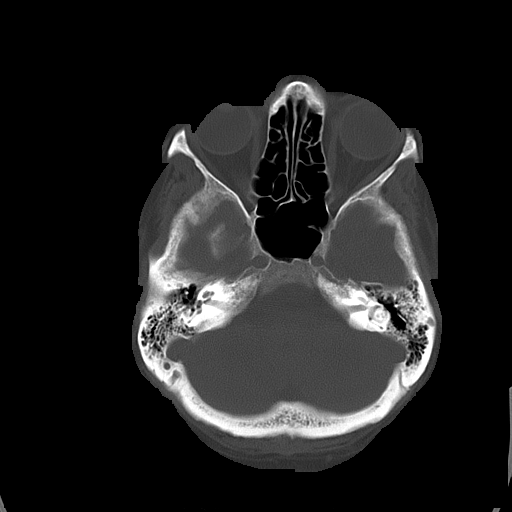
[im 11/30  bone]
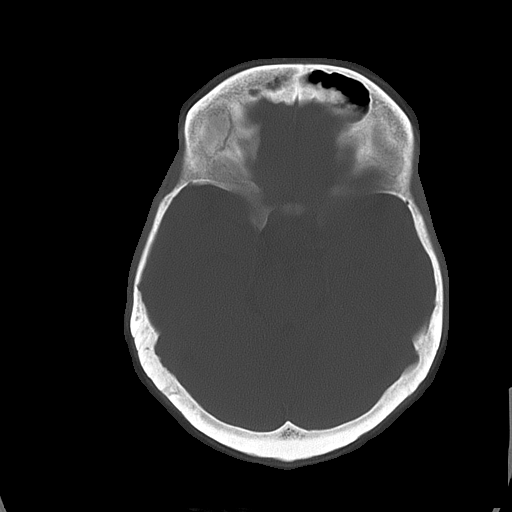

[16 of 30 positions shown; findings below may reference images not displayed]

FINDINGS: There is no evidence of mass effect, midline shift, or extra-axial fluid
collections.  There is no evidence of a space-occupying lesion or
intracranial hemorrhage. There is no evidence of a cortical-based area of
acute infarction.

The ventricles and sulci are appropriate for the patient's age. The basal
cisterns are patent.

Visualized portions of the orbits are unremarkable. The paranasal sinuses
and mastoid air cells are unremarkable.

The osseous structures are unremarkable.
IMPRESSION: No acute intracranial process.

## 2011-05-12 ENCOUNTER — Inpatient Hospital Stay: Payer: Self-pay | Admitting: Internal Medicine

## 2011-05-31 LAB — DIFFERENTIAL
Basophils Absolute: 0
Eosinophils Relative: 1
Lymphocytes Relative: 22
Lymphs Abs: 1.4
Neutro Abs: 4.6
Neutrophils Relative %: 72

## 2011-05-31 LAB — URINALYSIS, ROUTINE W REFLEX MICROSCOPIC
Nitrite: NEGATIVE
Specific Gravity, Urine: 1.008
Urobilinogen, UA: 1
pH: 6

## 2011-05-31 LAB — URINE MICROSCOPIC-ADD ON

## 2011-05-31 LAB — COMPREHENSIVE METABOLIC PANEL
AST: 33
BUN: 6
CO2: 25
Calcium: 9.4
Creatinine, Ser: 0.6
GFR calc Af Amer: >60
GFR calc non Af Amer: >60
Total Bilirubin: 1.2

## 2011-05-31 LAB — CBC
HCT: 41.5
MCHC: 34.3
MCV: 90.9
Platelets: 180
RDW: 14

## 2011-05-31 LAB — LIPASE, BLOOD: Lipase: 30

## 2011-06-01 LAB — COMPREHENSIVE METABOLIC PANEL
ALT: 26
ALT: 26
ALT: 27
ALT: 32
AST: 23
AST: 27
AST: 29
Albumin: 3.4 — ABNORMAL LOW
Albumin: 4.1
Alkaline Phosphatase: 117
Alkaline Phosphatase: 92
BUN: 10
BUN: 11
CO2: 24
CO2: 22
Calcium: 9.2
Calcium: 8.4
Calcium: 8.7
Chloride: 111
Chloride: 105
Chloride: 110
Creatinine, Ser: 0.79
Creatinine, Ser: 0.58
Creatinine, Ser: 0.65
GFR calc Af Amer: >60
GFR calc Af Amer: >60
GFR calc Af Amer: >60
GFR calc Af Amer: >60
GFR calc non Af Amer: >60
GFR calc non Af Amer: >60
Glucose, Bld: 126 — ABNORMAL HIGH
Glucose, Bld: 107 — ABNORMAL HIGH
Glucose, Bld: 93
Potassium: 3.3 — ABNORMAL LOW
Sodium: 141
Sodium: 136
Sodium: 137
Sodium: 141
Total Bilirubin: 1.1
Total Bilirubin: 0.9
Total Protein: 5.8 — ABNORMAL LOW
Total Protein: 6
Total Protein: 7.3

## 2011-06-01 LAB — URINALYSIS, ROUTINE W REFLEX MICROSCOPIC
Bilirubin Urine: NEGATIVE
Glucose, UA: NEGATIVE
Ketones, ur: NEGATIVE
Nitrite: NEGATIVE
Protein, ur: 30 — AB
Protein, ur: NEGATIVE
Urobilinogen, UA: 0.2
pH: 6.5

## 2011-06-01 LAB — CBC
Hemoglobin: 13.5
Hemoglobin: 12.2
Hemoglobin: 12.8
MCHC: 34.7
MCHC: 34.1
MCHC: 34.2
MCV: 91
MCV: 92.9
MCV: 92.9
MCV: 93.1
Platelets: 127 — ABNORMAL LOW
Platelets: 164
RBC: 4.27
RBC: 4.03
RDW: 13.9
RDW: 14.6
WBC: 5.7
WBC: 10.3
WBC: 4.9

## 2011-06-01 LAB — DIFFERENTIAL
Band Neutrophils: 0
Basophils Absolute: 0
Basophils Absolute: 0
Basophils Relative: 1
Basophils Relative: 0
Basophils Relative: 0
Basophils Relative: 1
Eosinophils Absolute: 0
Eosinophils Absolute: 0.1
Eosinophils Relative: 1
Eosinophils Relative: 0
Lymphocytes Relative: 33
Lymphs Abs: 2.2
Metamyelocytes Relative: 0
Monocytes Absolute: 0.3
Monocytes Relative: 3
Monocytes Relative: 6
Myelocytes: 0
Neutro Abs: 2.9
Neutro Abs: 8.7 — ABNORMAL HIGH
Neutrophils Relative %: 71
Neutrophils Relative %: 59
Neutrophils Relative %: 59
Promyelocytes Absolute: 0

## 2011-06-01 LAB — ACETAMINOPHEN LEVEL
Acetaminophen (Tylenol), Serum: <10 — ABNORMAL LOW
Acetaminophen (Tylenol), Serum: <10 — ABNORMAL LOW

## 2011-06-01 LAB — RAPID URINE DRUG SCREEN, HOSP PERFORMED
Amphetamines: NOT DETECTED
Barbiturates: NOT DETECTED
Benzodiazepines: NOT DETECTED
Cocaine: NOT DETECTED
Opiates: POSITIVE — AB

## 2011-06-01 LAB — URINE MICROSCOPIC-ADD ON

## 2011-06-01 LAB — D-DIMER, QUANTITATIVE: D-Dimer, Quant: 0.24

## 2011-06-01 LAB — SALICYLATE LEVEL: Salicylate Lvl: 16

## 2011-06-01 LAB — LIPASE, BLOOD: Lipase: 57

## 2011-06-01 LAB — POCT CARDIAC MARKERS: Myoglobin, poc: 32.3

## 2011-06-15 LAB — LIPASE, BLOOD: Lipase: 44

## 2011-06-15 LAB — COMPREHENSIVE METABOLIC PANEL
Albumin: 3.6
BUN: 8
Creatinine, Ser: 0.64
Glucose, Bld: 122 — ABNORMAL HIGH
Total Protein: 6.7

## 2011-06-15 LAB — CBC
HCT: 35.8 — ABNORMAL LOW
HCT: 41.4
Hemoglobin: 11.8 — ABNORMAL LOW
Hemoglobin: 12.2
Hemoglobin: 14.1
MCHC: 34
MCV: 91.7
MCV: 92.6
Platelets: 184
RBC: 3.72 — ABNORMAL LOW
RBC: 3.89
RDW: 13.6
RDW: 13.6
WBC: 5.2

## 2011-06-15 LAB — DIFFERENTIAL
Basophils Absolute: 0
Basophils Relative: 0
Eosinophils Absolute: 0.1
Eosinophils Relative: 2
Lymphocytes Relative: 40
Lymphocytes Relative: 42
Lymphs Abs: 1.5
Monocytes Absolute: 0.3
Monocytes Absolute: 0.3
Monocytes Absolute: 0.3
Monocytes Relative: 4
Monocytes Relative: 4
Monocytes Relative: 5
Neutro Abs: 3.6
Neutro Abs: 4.3
Neutrophils Relative %: 55
Neutrophils Relative %: 64

## 2011-06-15 LAB — BASIC METABOLIC PANEL
CO2: 23
CO2: 28
Calcium: 8.3 — ABNORMAL LOW
Chloride: 105
Chloride: 109
Creatinine, Ser: 0.59
GFR calc Af Amer: >60
GFR calc Af Amer: >60
GFR calc Af Amer: >60
GFR calc non Af Amer: >60
GFR calc non Af Amer: >60
Glucose, Bld: 121 — ABNORMAL HIGH
Potassium: 3.6
Potassium: 3.6
Potassium: 3.8
Sodium: 134 — ABNORMAL LOW
Sodium: 138
Sodium: 140

## 2011-06-15 LAB — TSH: TSH: 7.964 — ABNORMAL HIGH

## 2011-06-19 LAB — CBC
HCT: 33.4 — ABNORMAL LOW
HCT: 36.2
HCT: 37.2
HCT: 45.5
Hemoglobin: 11.4 — ABNORMAL LOW
Hemoglobin: 12.4
Hemoglobin: 15.3 — ABNORMAL HIGH
MCHC: 34.2
MCHC: 34.7
MCV: 91.4
Platelets: 148 — ABNORMAL LOW
Platelets: 198
RBC: 3.58 — ABNORMAL LOW
RBC: 4.92
RBC: 4.96
RDW: 13.8
RDW: 14
RDW: 14
WBC: 6.4
WBC: 8.2

## 2011-06-19 LAB — DIFFERENTIAL
Basophils Absolute: 0.2 — ABNORMAL HIGH
Basophils Relative: 3 — ABNORMAL HIGH
Eosinophils Absolute: 0
Eosinophils Absolute: 0 — ABNORMAL LOW
Eosinophils Relative: 0
Eosinophils Relative: 2
Lymphocytes Relative: 31
Lymphs Abs: 1.2
Lymphs Abs: 2
Monocytes Absolute: 0.3
Monocytes Absolute: 0.3
Monocytes Relative: 2 — ABNORMAL LOW
Monocytes Relative: 3
Neutrophils Relative %: 78 — ABNORMAL HIGH

## 2011-06-19 LAB — HEPATIC FUNCTION PANEL
AST: 24
Alkaline Phosphatase: 77
Bilirubin, Direct: 0.1
Total Bilirubin: 0.7

## 2011-06-19 LAB — COMPREHENSIVE METABOLIC PANEL
ALT: 30
ALT: 44 — ABNORMAL HIGH
AST: 42 — ABNORMAL HIGH
Albumin: 3.1 — ABNORMAL LOW
Albumin: 4
Alkaline Phosphatase: 120 — ABNORMAL HIGH
BUN: 11
CO2: 22
CO2: 23
Calcium: 9.7
Chloride: 104
Creatinine, Ser: 0.74
GFR calc Af Amer: >60
Glucose, Bld: 149 — ABNORMAL HIGH
Potassium: 3.6
Potassium: 3.8
Sodium: 135
Sodium: 139
Total Bilirubin: 2.4 — ABNORMAL HIGH
Total Protein: 5.8 — ABNORMAL LOW
Total Protein: 7
Total Protein: 7.3

## 2011-06-19 LAB — BASIC METABOLIC PANEL
CO2: 23
GFR calc Af Amer: >60
GFR calc non Af Amer: >60
Glucose, Bld: 122 — ABNORMAL HIGH
Potassium: 3.3 — ABNORMAL LOW
Potassium: 3.9
Sodium: 140
Sodium: 141

## 2011-06-19 LAB — URINALYSIS, ROUTINE W REFLEX MICROSCOPIC
Glucose, UA: NEGATIVE
Ketones, ur: >80 — AB
pH: 6.5

## 2011-06-19 LAB — PROTIME-INR: INR: 1.3

## 2011-06-19 LAB — LIPASE, BLOOD: Lipase: 16

## 2011-06-19 LAB — TSH: TSH: 6.529 — ABNORMAL HIGH

## 2011-06-19 LAB — URINE MICROSCOPIC-ADD ON

## 2011-06-19 LAB — AMYLASE: Amylase: 65

## 2011-06-19 LAB — APTT: aPTT: 39 — ABNORMAL HIGH

## 2011-06-19 IMAGING — CT CT HEAD WITHOUT CONTRAST
2 series · 16 of 30 positions shown, 20 images · non-contrast
Comparison: none

REASON FOR EXAM: confused
COMMENTS:

[Series 2: without · axial · non-contrast · 0.39mm/px · z∈[-136,-16]mm · 13 of 30 slices shown, 17 images]
[im 3/30  brain]
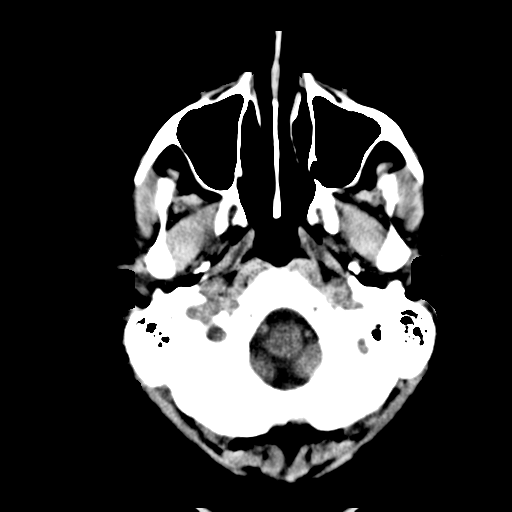
[im 3/30  bone]
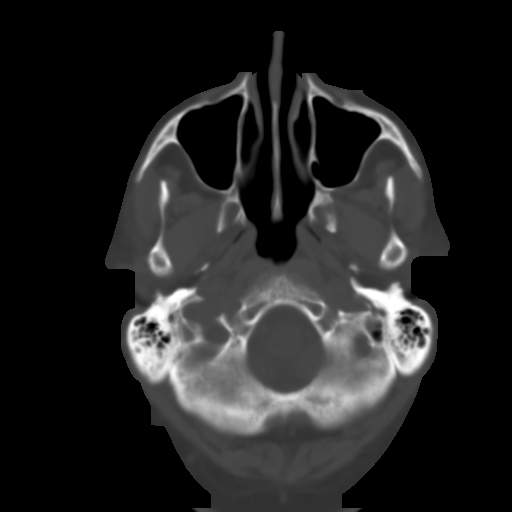
[im 5/30  brain]
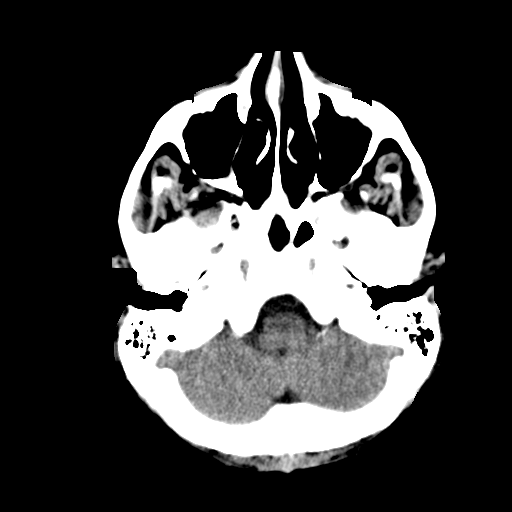
[im 7/30  brain]
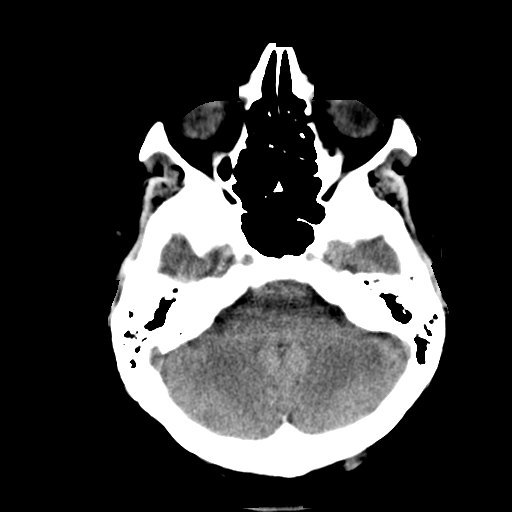
[im 9/30  brain]
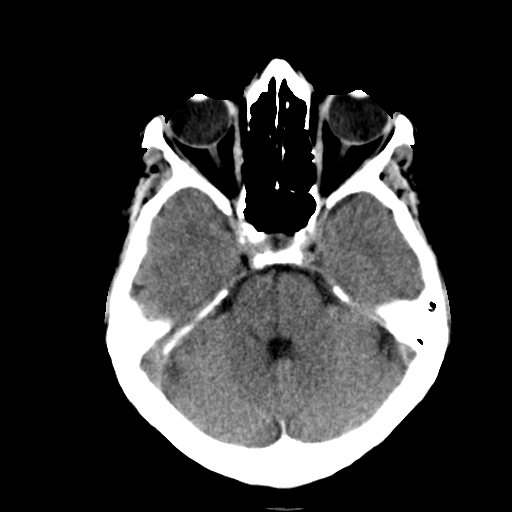
[im 11/30  brain]
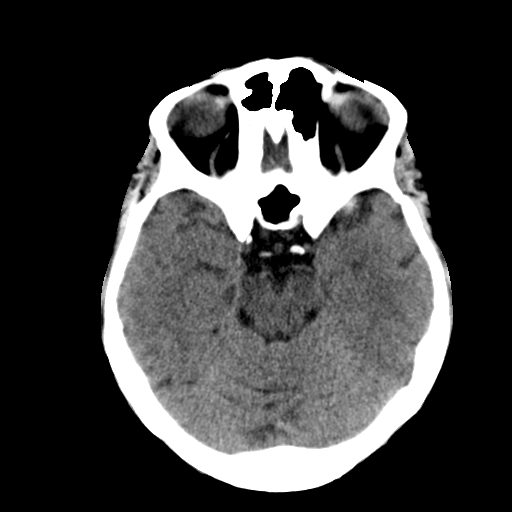
[im 11/30  bone]
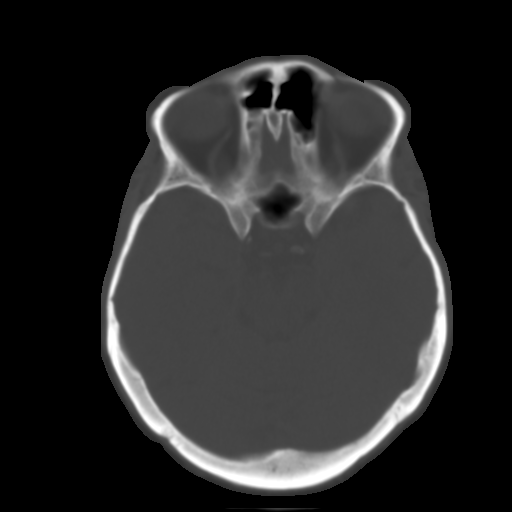
[im 13/30  brain]
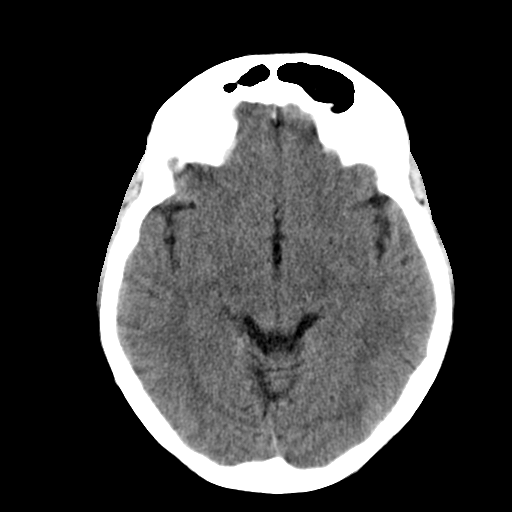
[im 15/30  brain]
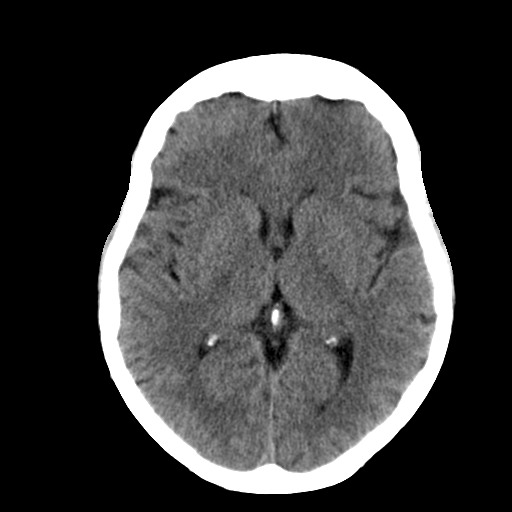
[im 17/30  brain]
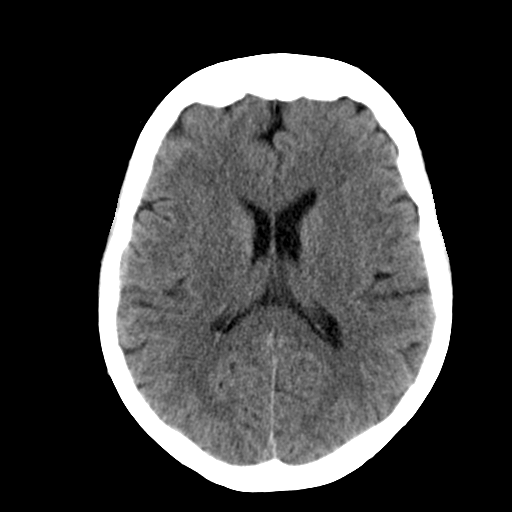
[im 19/30  brain]
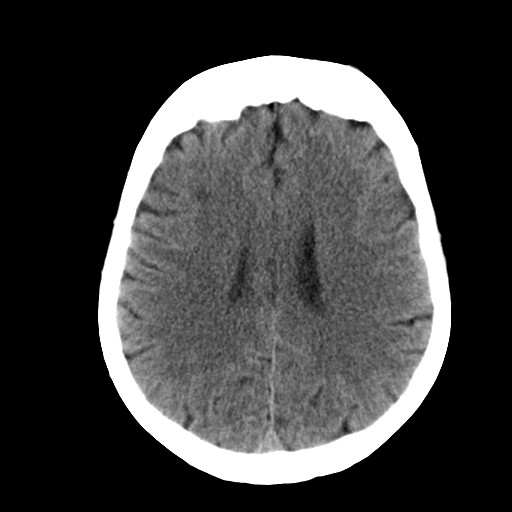
[im 19/30  bone]
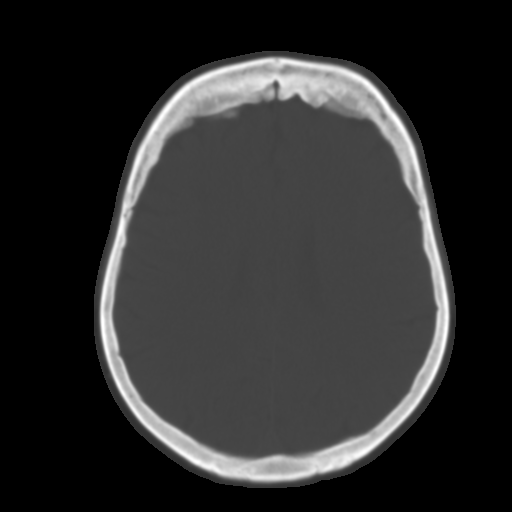
[im 21/30  brain]
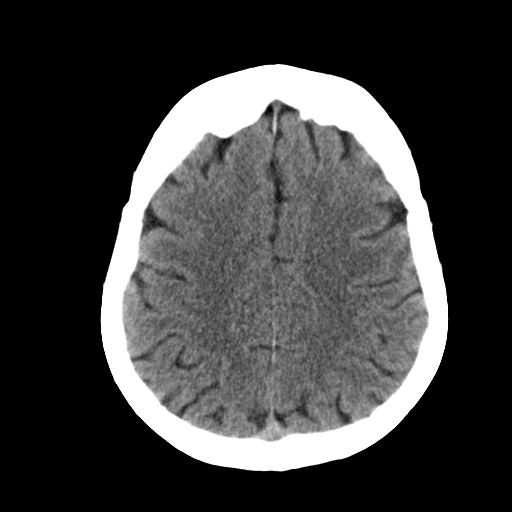
[im 23/30  brain]
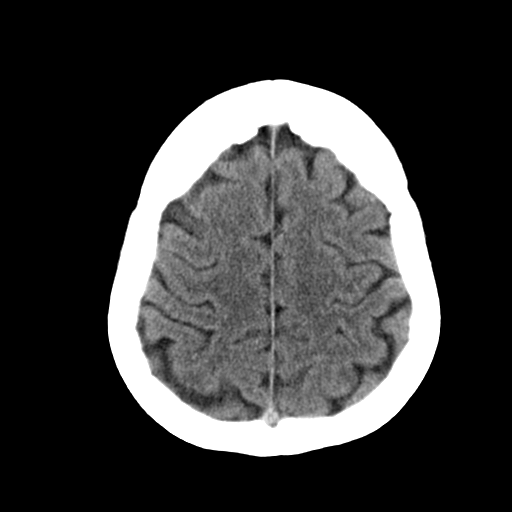
[im 25/30  brain]
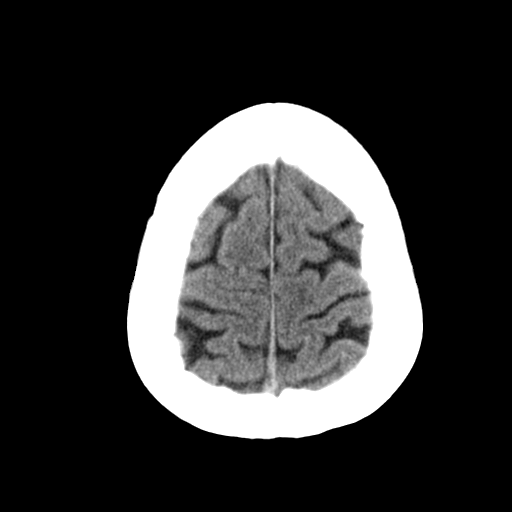
[im 27/30  brain]
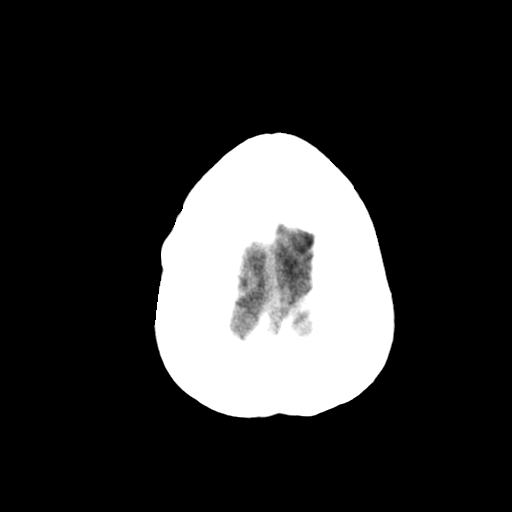
[im 27/30  bone]
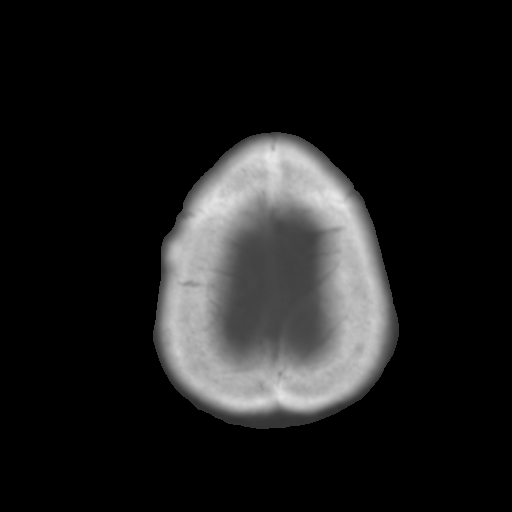

[Series 3: bone · axial · 0.39mm/px · z∈[-136,-96]mm · 3 of 30 slices shown]
[im 3/30  bone]
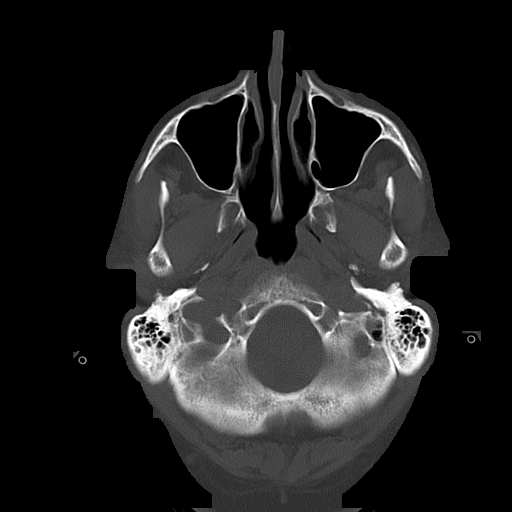
[im 7/30  bone]
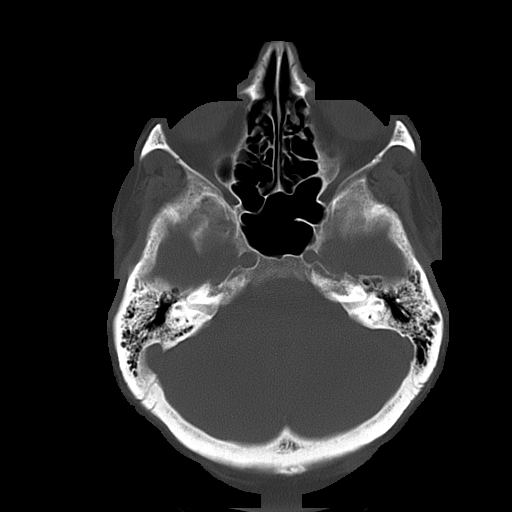
[im 11/30  bone]
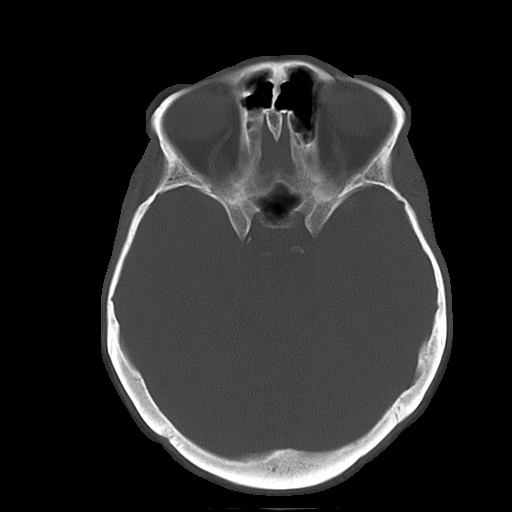

[16 of 30 positions shown; findings below may reference images not displayed]

PROCEDURE:     CT  - CT HEAD WITHOUT CONTRAST  - July 26, 2009 [DATE]

RESULT:     Axial noncontrast CT scanning was performed through the brain at
5 mm intervals and slice thicknesses. Comparison is made to the study 24 April, 2009.

The ventricles are normal in size and position. There is no intracranial
hemorrhage nor intracranial mass effect. The cerebellum and brainstem are
normal in density. There are no findings suspicious for an evolving ischemic
infarction. At bone window settings the observed portions of the paranasal
sinuses are clear.
IMPRESSION: I see no acute intracranial abnormality. When compared to
the prior examination I do not see significant interval change.

A preliminary report was sent to the [HOSPITAL] the conclusion
of the study.

## 2011-06-19 IMAGING — CR DG ABDOMEN 2V
1 series · 3 of 3 positions shown · non-contrast
Comparison: none

REASON FOR EXAM: abdominal pain
COMMENTS:

[Series 1: view not recorded · 0.17mm/px · 3 of 3 slices shown]
[im 1/3]
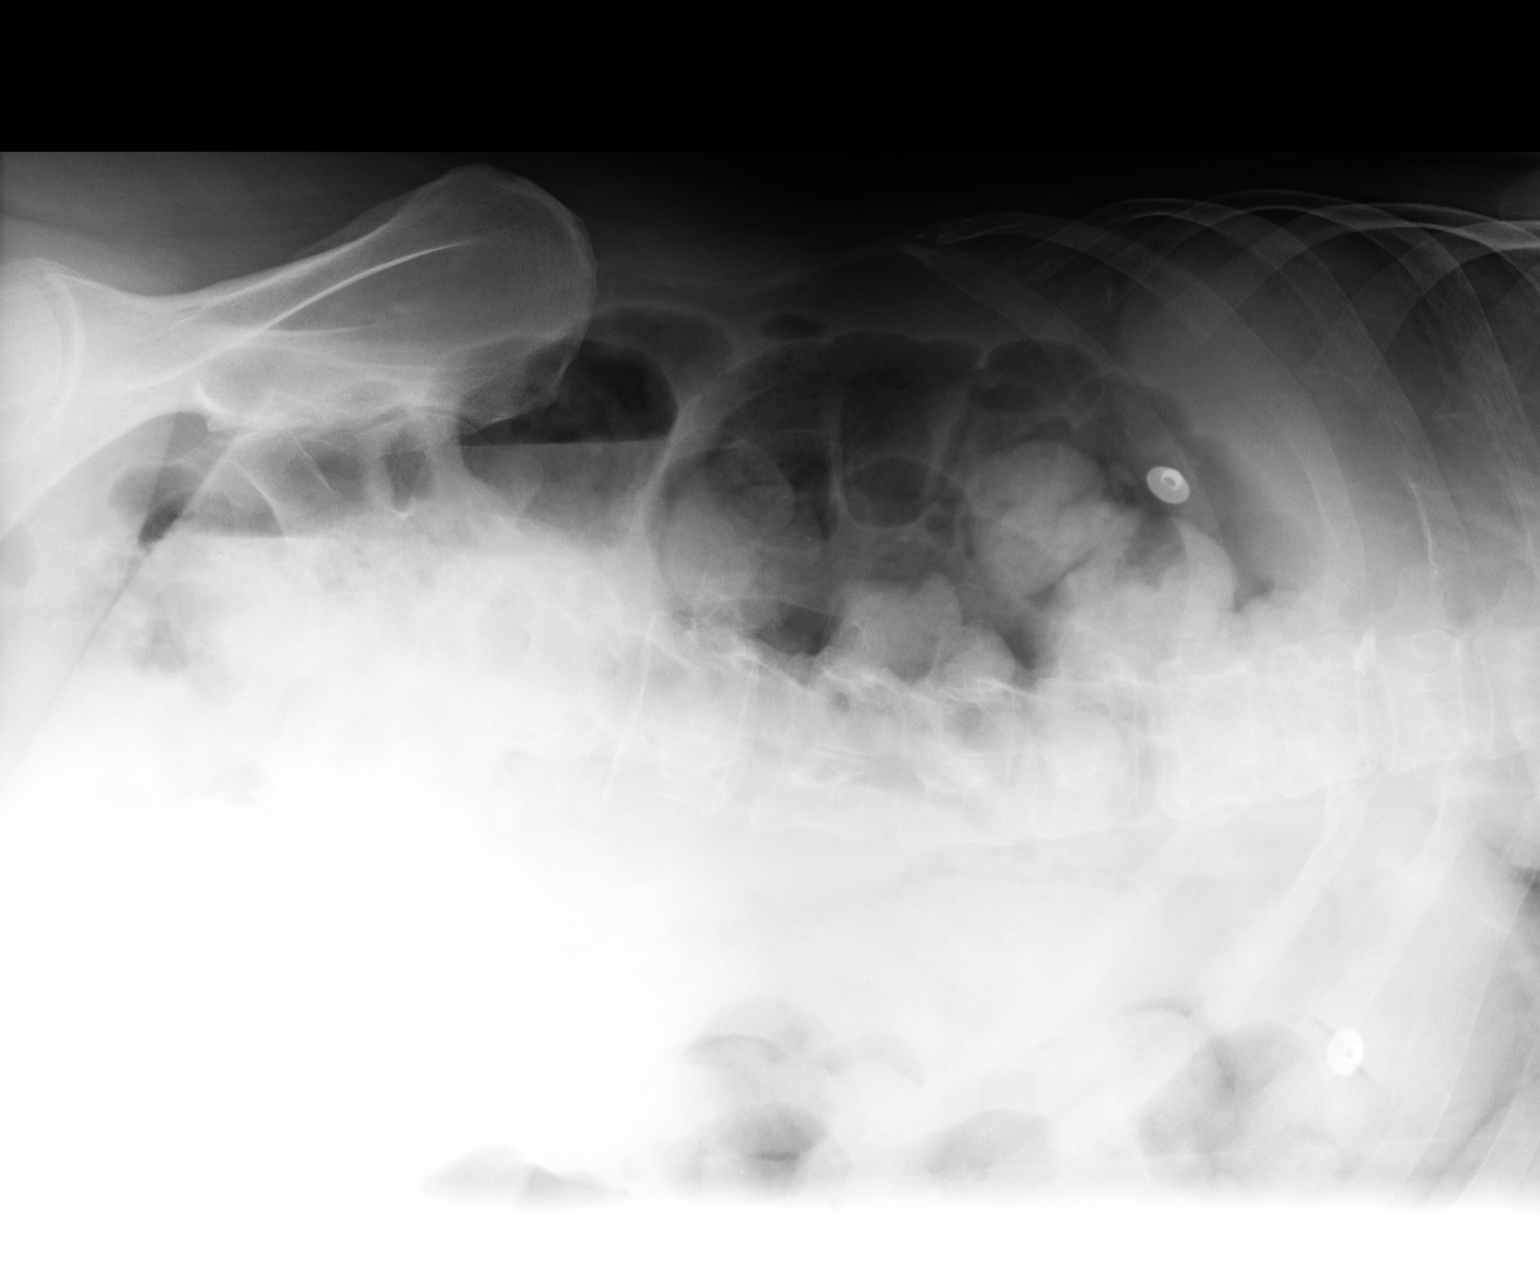
[im 2/3]
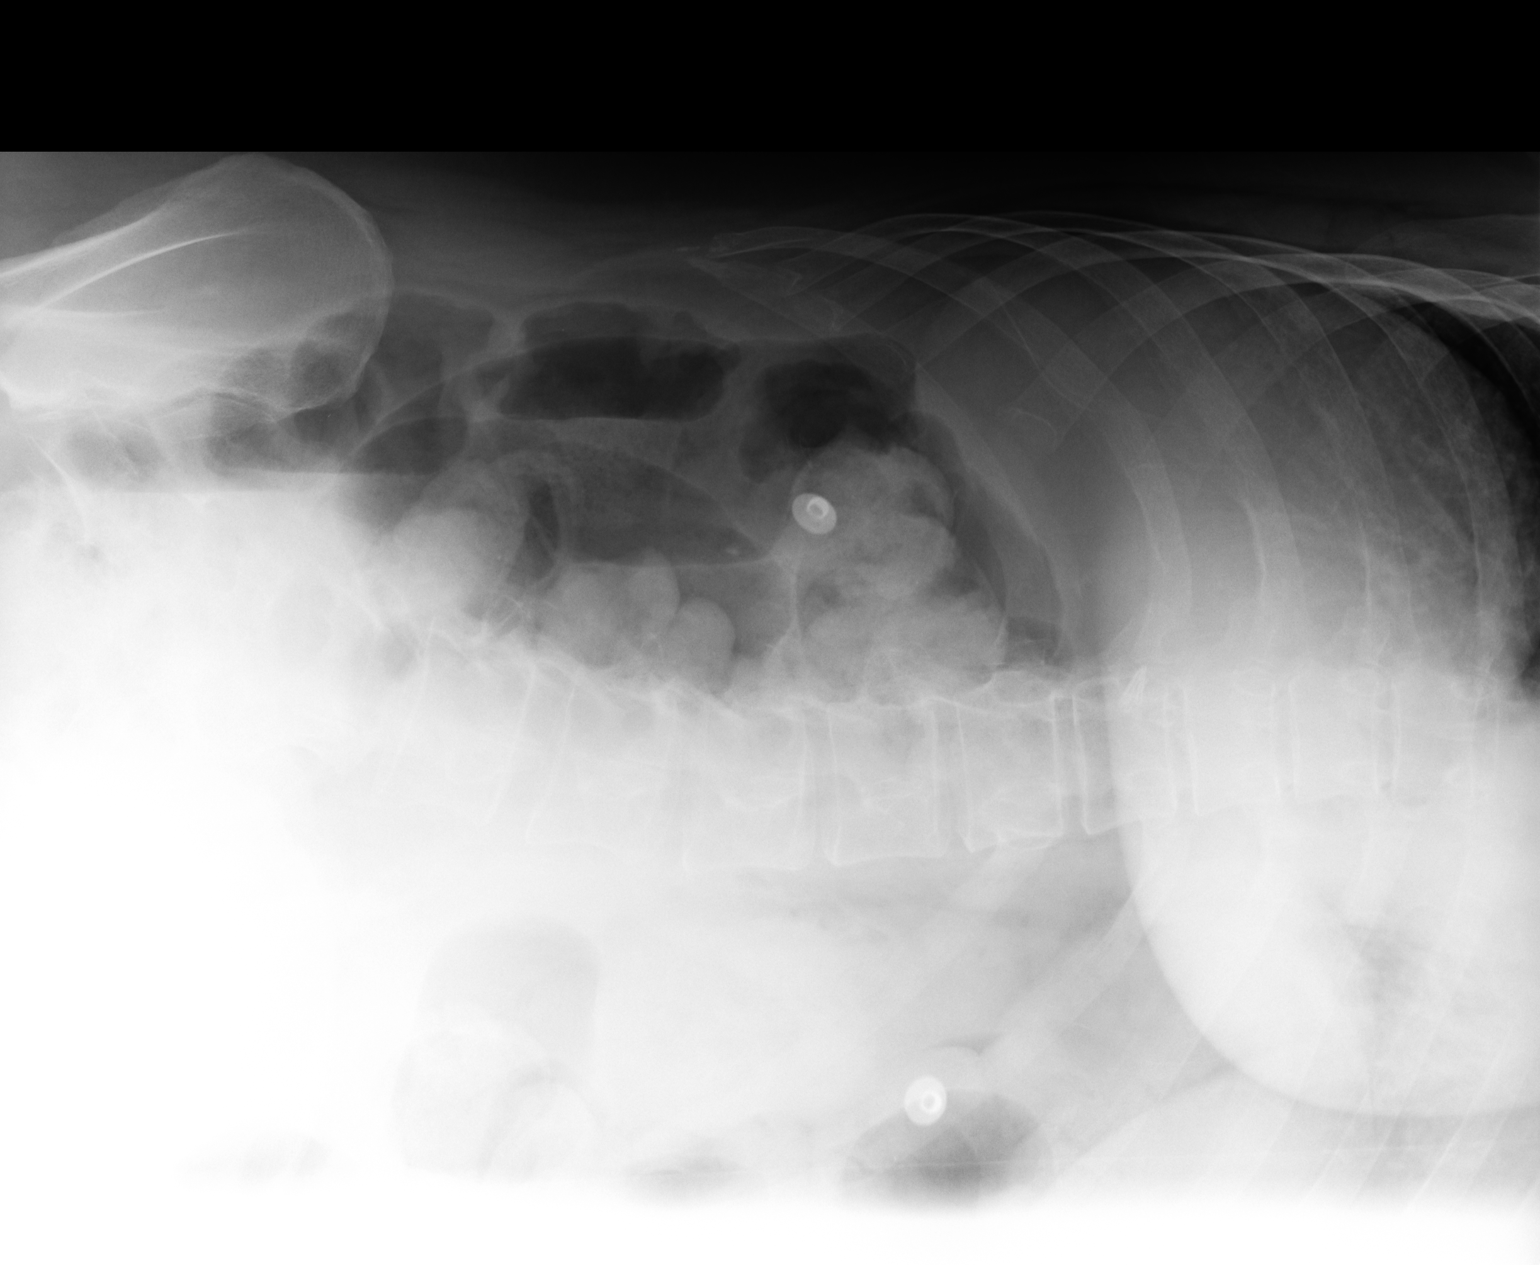
[im 3/3]
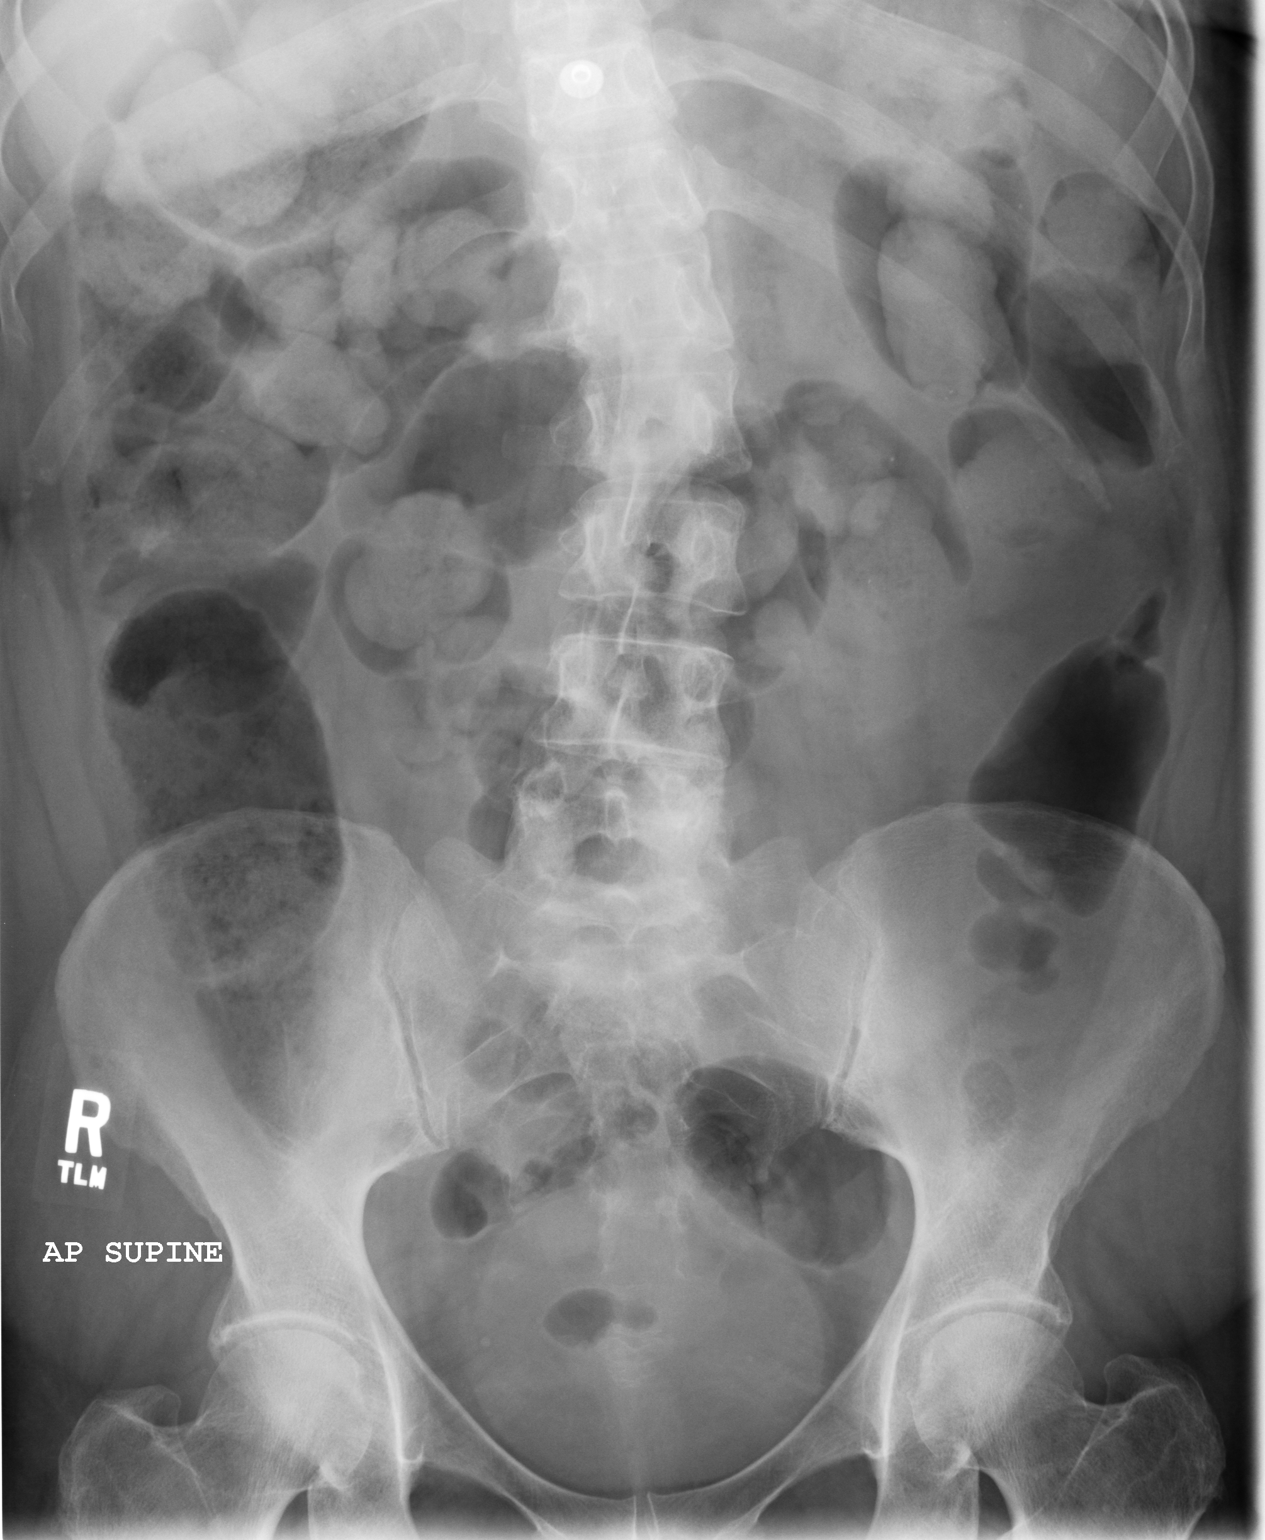

[3 of 3 positions shown; findings below may reference images not displayed]

PROCEDURE:     DXR - DXR ABDOMEN 2 V FLAT AND ERECT  - July 26, 2009 [DATE]

RESULT:     Comparison is made to images of 04/18/2009. Left lateral decubitus
views demonstrate no free air. There is a moderate amount of fecal material
in the bowel. There is a scoliotic curvature concave to the right centered
in the upper lumbar region. There is no free air or abnormal bowel
distention. There is no radiopaque foreign body.
IMPRESSION: Please see above.

## 2011-06-19 IMAGING — CR DG CHEST 1V PORT
1 series · 1 of 1 positions shown · non-contrast
Comparison: none

REASON FOR EXAM: shortness of breath
COMMENTS:

[view not recorded]
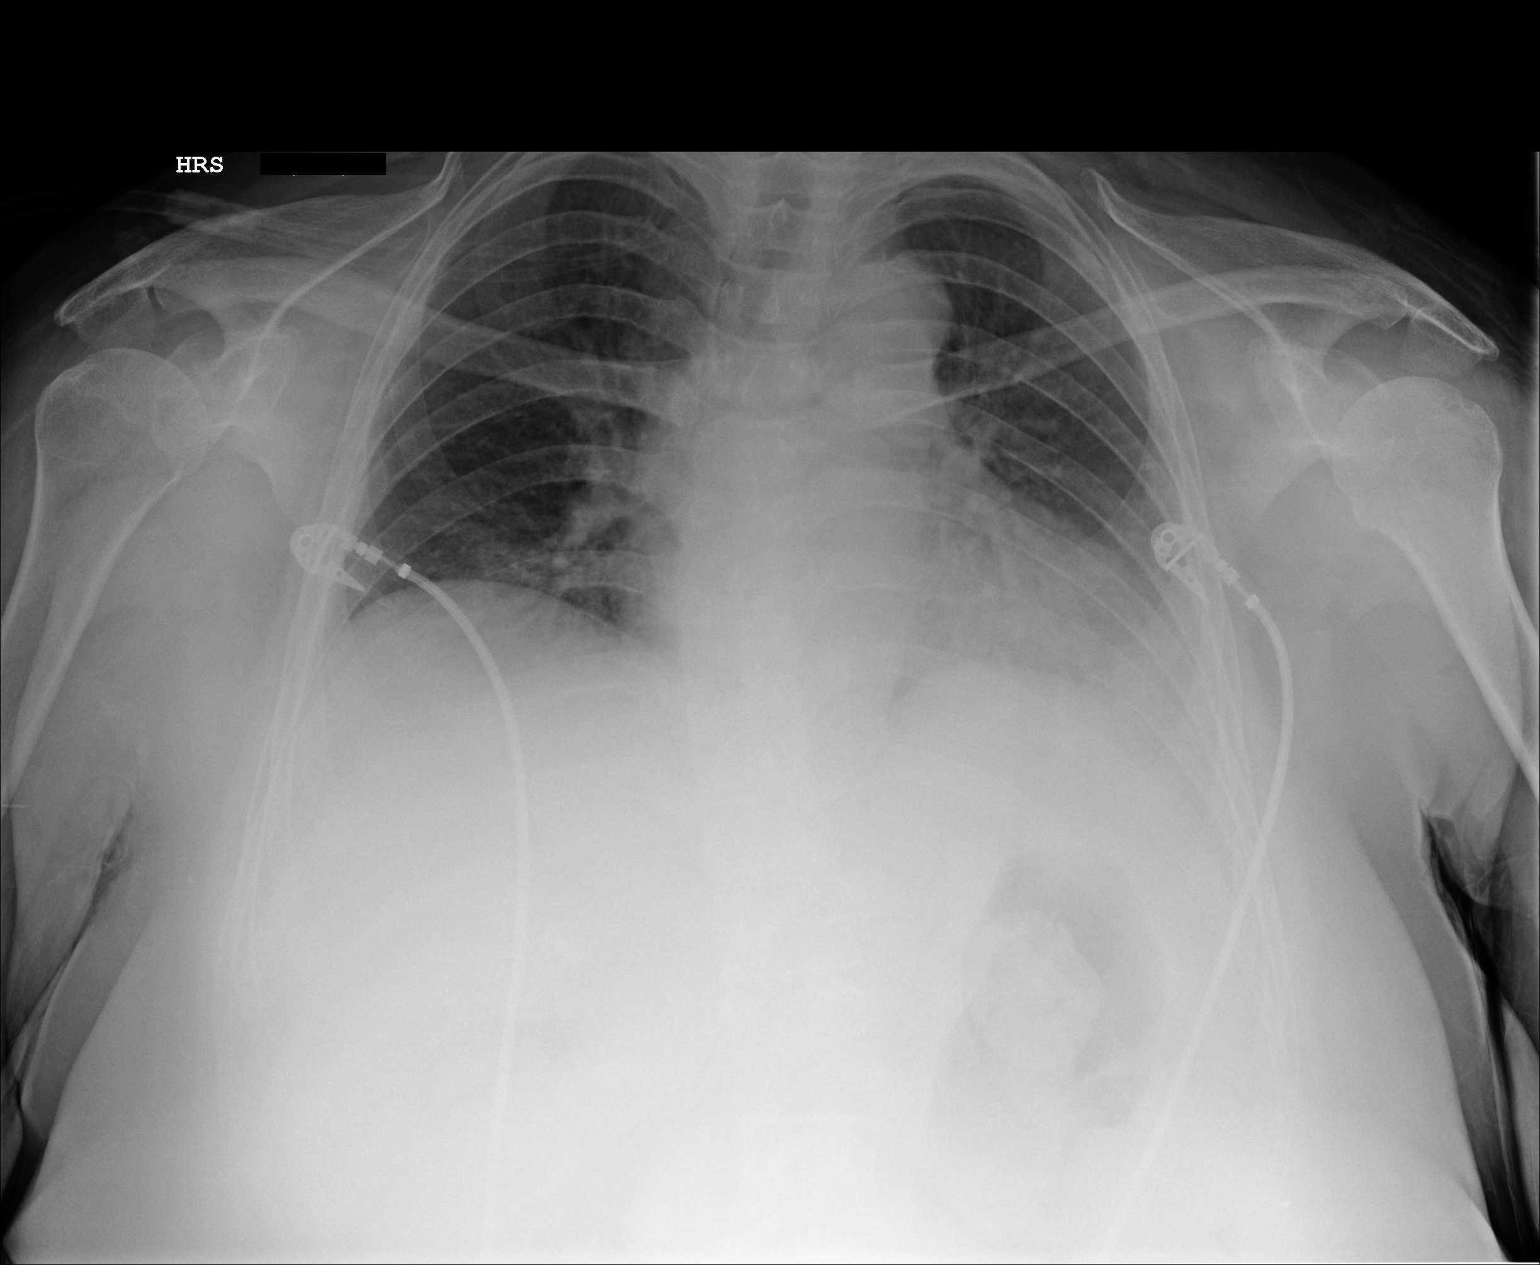

[1 of 1 positions shown; findings below may reference images not displayed]

PROCEDURE:     DXR - DXR PORTABLE CHEST SINGLE VIEW  - July 26, 2009  [DATE]

RESULT:     Comparison is made to the exam of 04/24/2009. As noted previously
there is very shallow inspiration. The cardiac silhouette is enlarged. There
appears to be basilar atelectasis bilaterally. There is no definite edema,
infiltrate or effusion. Monitoring electrodes are present.
IMPRESSION: Shallow inspiration with basilar atelectasis and
cardiomegaly. Stable appearance.

## 2011-06-20 IMAGING — CT CT ABD-PELV W/O CM
1 of 2 series · 15 of 32 positions shown, 19 images · non-contrast
Comparison: none

REASON FOR EXAM: (1) pain LUQ; (2) pain LUQ
COMMENTS:

[Series 2: abdomen · axial · 0.85mm/px · z∈[-926,-486]mm · 15 of 96 slices shown, 19 images]
[im 4/96  soft-tissue]
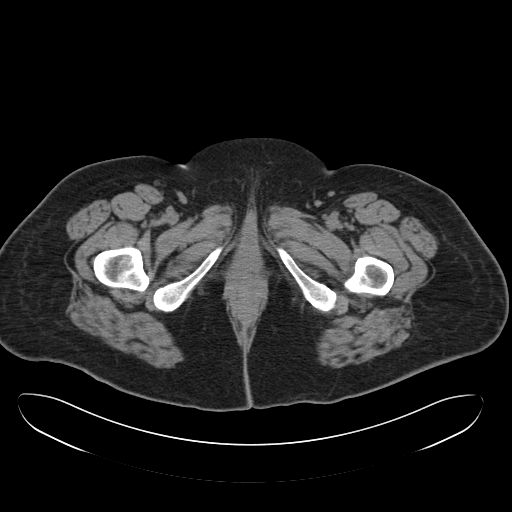
[im 4/96  bone]
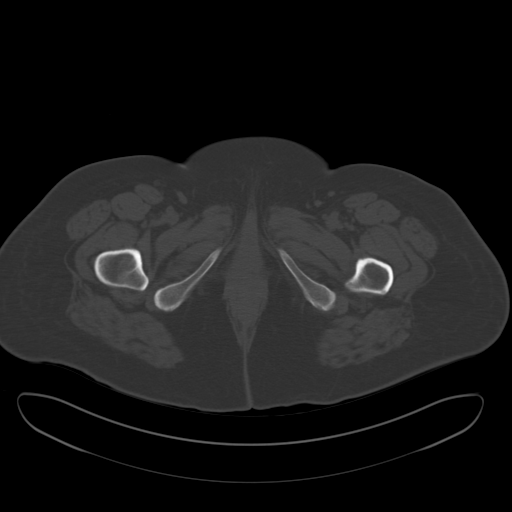
[im 11/96  soft-tissue]
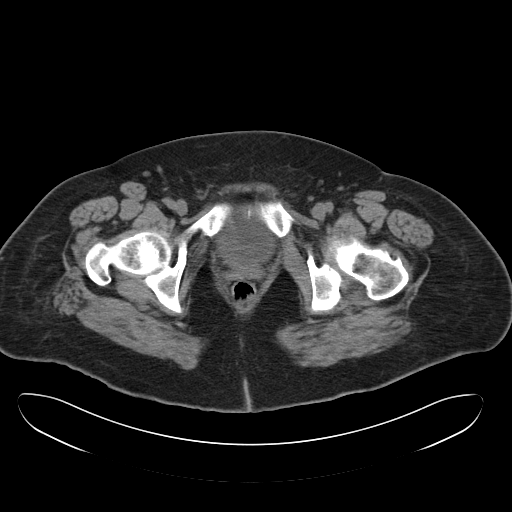
[im 19/96  soft-tissue]
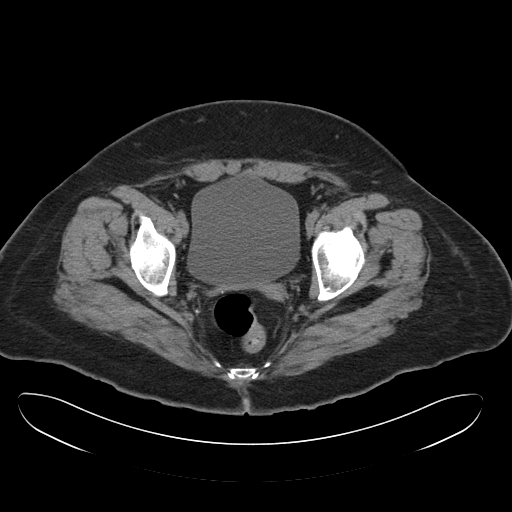
[im 26/96  soft-tissue]
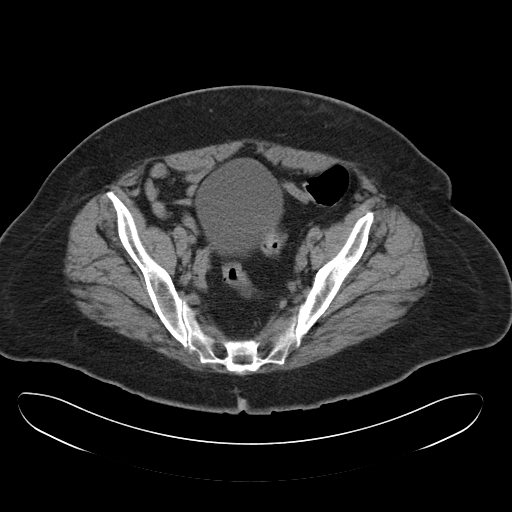
[im 33/96  soft-tissue]
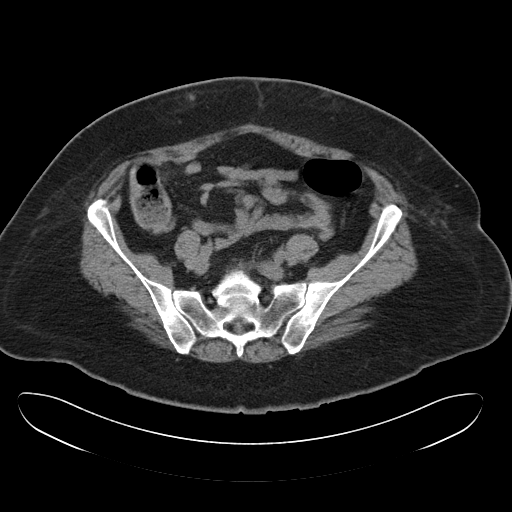
[im 41/96  soft-tissue]
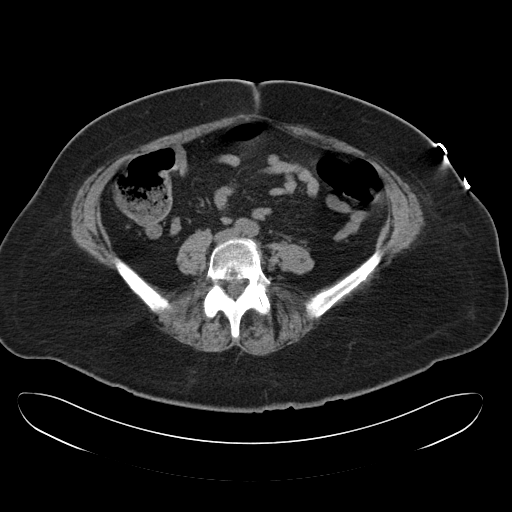
[im 48/96  soft-tissue]
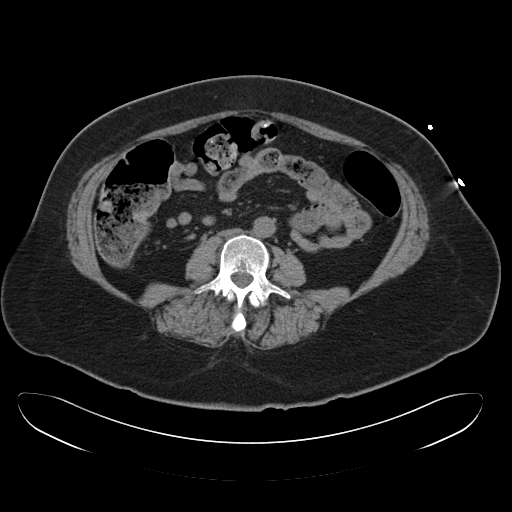
[im 55/96  soft-tissue]
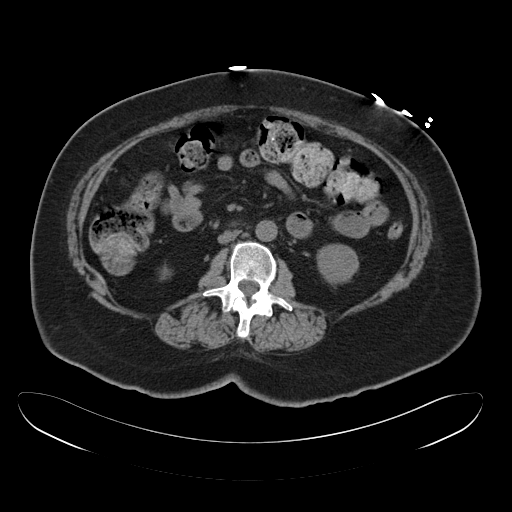
[im 63/96  soft-tissue]
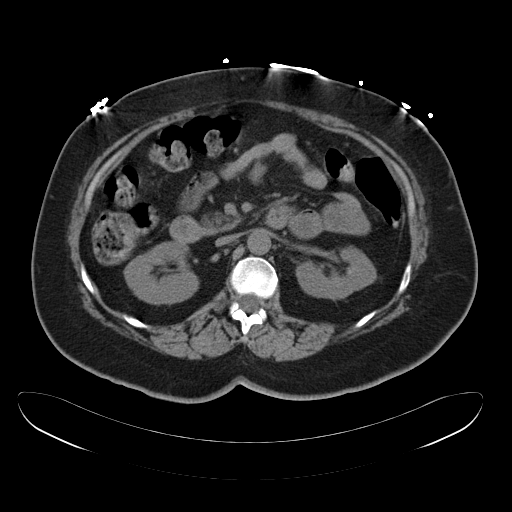
[im 63/96  bone]
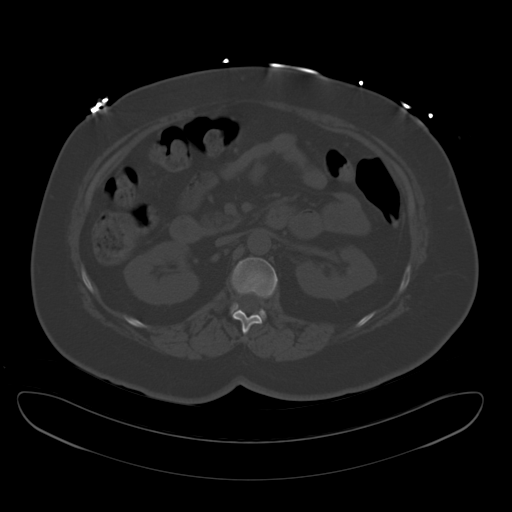
[im 70/96  soft-tissue]
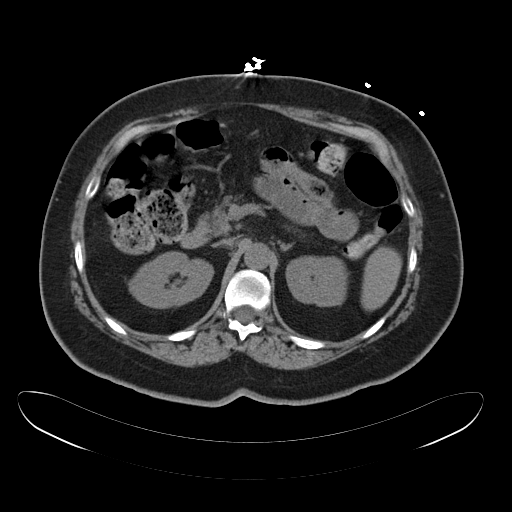
[im 77/96  soft-tissue]
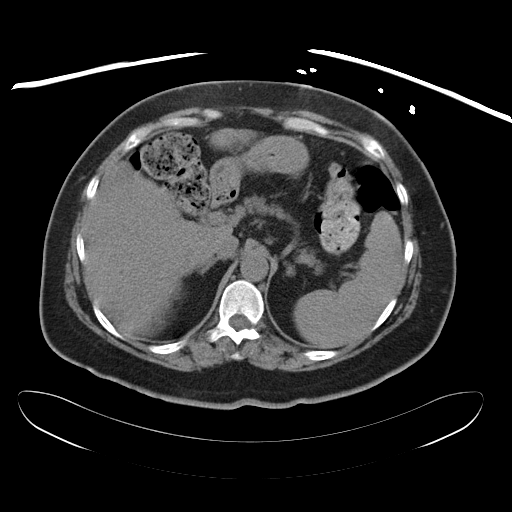
[im 81/96  lung]
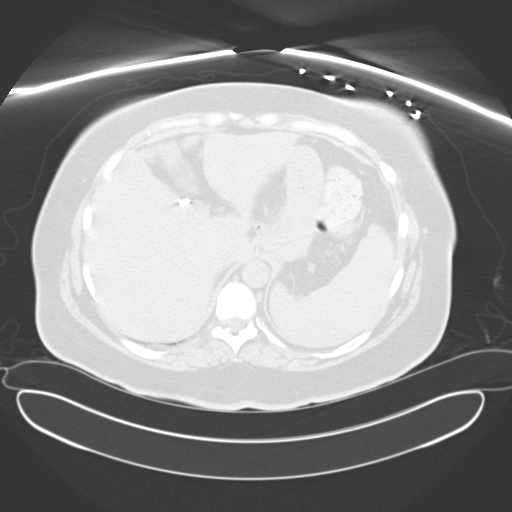
[im 85/96  soft-tissue]
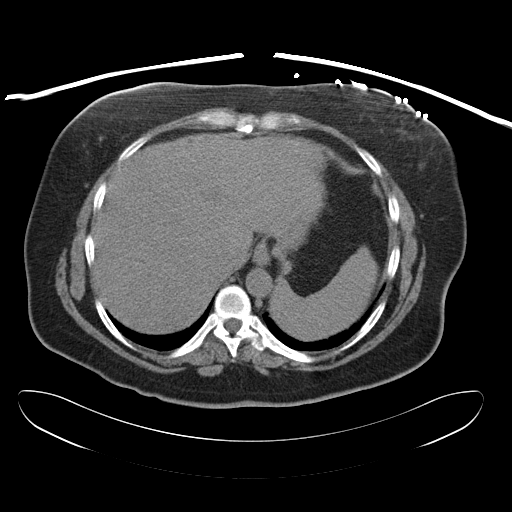
[im 85/96  lung]
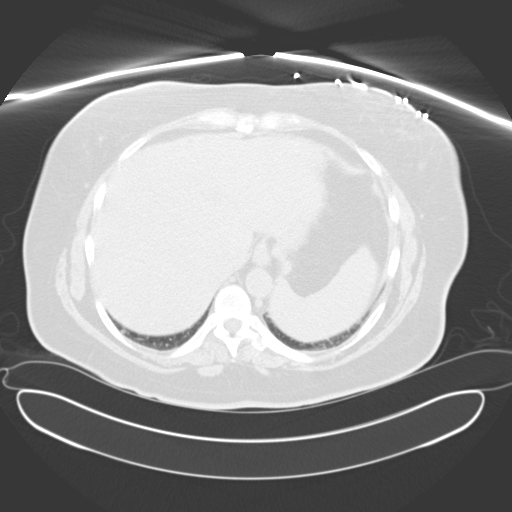
[im 88/96  lung]
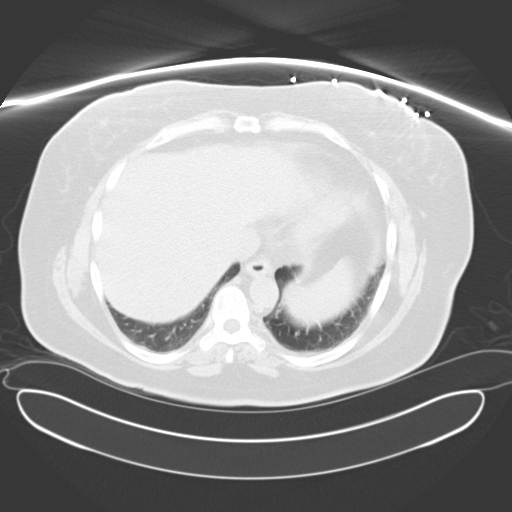
[im 92/96  soft-tissue]
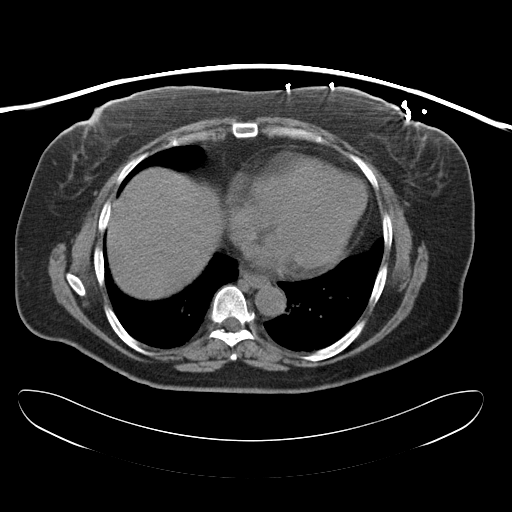
[im 92/96  lung]
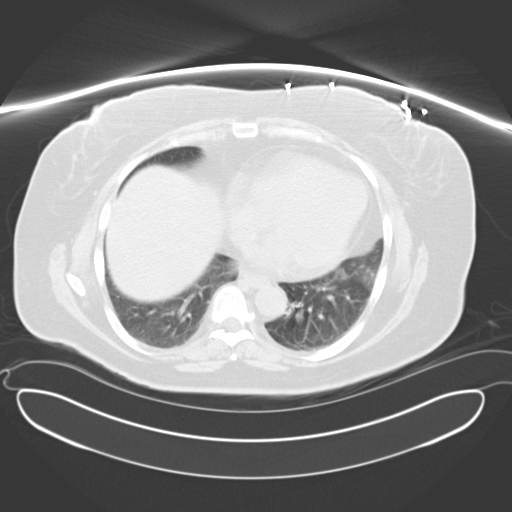

[15 of 32 positions shown; findings below may reference images not displayed]

PROCEDURE:     CT  - CT ABDOMEN AND PELVIS W[DATE] [DATE]

RESULT:     Axial noncontrast CT scanning was performed through the abdomen
and pelvis at 3 mm correction at 5 mm intervals and slice thicknesses.
Review of 3-dimensional reconstructed images was performed separately on the
[employer] monitor.

The liver exhibits no focal mass or ductal dilation on this noncontrast
exam. The gallbladder surgically absent. The  nondistended stomach,
pancreas, adrenal glands, and kidneys exhibit no acute abnormality. The
spleen is top normal in size at 13 cm. A nonobstructing approximately 3 by 6
mm diameter lower pole stone on the right is present and stable. The caliber
of the abdominal aorta is normal. The unopacified loops of small and large
bowel exhibit no evidence of diverticulitis or obstruction. I see no free
fluid in the abdomen or pelvis. The uterus is surgically absent. There are
no adnexal masses. The partially distended urinary bladder is normal in
appearance. There are phleboliths within the pelvis. The caliber of the
abdominal aorta is normal. The lung bases exhibit no focal infiltrate. The
lumbar vertebral bodies are preserved in height.
IMPRESSION: 1. The gallbladder surgically absent. I do not see evidence of acute
hepatobiliary abnormality. The spleen is top normal in size at approximately
13 centimeters.
2. I do not see acute bowel abnormality.
3. No acute urinary tract abnormality is demonstrated. There is a
nonobstructing lower pole stone on the right which appear stable. There are
phleboliths adjacent to the urinary bladder base. The ureterovesical
junctions are demonstrated and appear normal

A preliminary report was sent to the [HOSPITAL] the conclusion
of the study.

## 2011-06-21 LAB — FECAL LACTOFERRIN, QUANT

## 2011-06-21 LAB — COMPREHENSIVE METABOLIC PANEL
ALT: 33
ALT: 39 — ABNORMAL HIGH
ALT: 36 — ABNORMAL HIGH
AST: 32
AST: 33
AST: 32
Albumin: 3 — ABNORMAL LOW
Albumin: 3.1 — ABNORMAL LOW
Albumin: 3.2 — ABNORMAL LOW
Alkaline Phosphatase: 112
Alkaline Phosphatase: 87
Alkaline Phosphatase: 88
Alkaline Phosphatase: 95
Alkaline Phosphatase: 108
BUN: 13
BUN: 2 — ABNORMAL LOW
BUN: 15
CO2: 23
CO2: 24
CO2: 23
Calcium: 8.7
Calcium: 9
Calcium: 8.4
Chloride: 105
Chloride: 107
Chloride: 111
Creatinine, Ser: 0.57
Creatinine, Ser: 0.77
Creatinine, Ser: 0.71
GFR calc Af Amer: >60
GFR calc Af Amer: >60
GFR calc Af Amer: >60
GFR calc non Af Amer: >60
GFR calc non Af Amer: >60
GFR calc non Af Amer: >60
Glucose, Bld: 170 — ABNORMAL HIGH
Glucose, Bld: 98
Glucose, Bld: 160 — ABNORMAL HIGH
Potassium: 3.5
Potassium: 3.7
Potassium: 3.8
Potassium: 4.2
Potassium: 3.5
Sodium: 135
Sodium: 143
Sodium: 141
Total Bilirubin: 1.1
Total Bilirubin: 1.3 — ABNORMAL HIGH
Total Bilirubin: 1.1
Total Protein: 5.4 — ABNORMAL LOW
Total Protein: 6.6
Total Protein: 6

## 2011-06-21 LAB — CBC
HCT: 42
HCT: 38.8
HCT: 39.4
HCT: 40.1
Hemoglobin: 14.5
Hemoglobin: 13.3
Hemoglobin: 13.6
MCHC: 34.1
MCHC: 34.2
MCHC: 34.5
MCHC: 34.1
MCHC: 34.4
MCHC: 34.6
MCV: 91
MCV: 91.9
MCV: 92.3
MCV: 91.8
MCV: 91.9
MCV: 92.4
Platelets: 141 — ABNORMAL LOW
Platelets: 153
Platelets: 164
Platelets: 186
RBC: 3.65 — ABNORMAL LOW
RBC: 3.77 — ABNORMAL LOW
RBC: 3.8 — ABNORMAL LOW
RBC: 4.89
RBC: 4.23
RBC: 4.29
RBC: 4.34
RDW: 13
RDW: 13.9
RDW: 14
RDW: 14.2 — ABNORMAL HIGH
WBC: 12.5 — ABNORMAL HIGH
WBC: 5.3
WBC: 7.9
WBC: 9.3
WBC: 9.9

## 2011-06-21 LAB — URINALYSIS, ROUTINE W REFLEX MICROSCOPIC
Bilirubin Urine: NEGATIVE
Glucose, UA: NEGATIVE
Hgb urine dipstick: NEGATIVE
Hgb urine dipstick: NEGATIVE
Nitrite: NEGATIVE
Protein, ur: 100 — AB
Protein, ur: NEGATIVE
Specific Gravity, Urine: 1.015
Specific Gravity, Urine: 1.013
Urobilinogen, UA: 1
Urobilinogen, UA: 4 — ABNORMAL HIGH
pH: 7
pH: 6.5

## 2011-06-21 LAB — DIFFERENTIAL
Basophils Absolute: 0
Basophils Relative: 0
Basophils Relative: 0
Basophils Relative: 0
Basophils Relative: 1
Eosinophils Absolute: 0
Eosinophils Absolute: 0
Eosinophils Absolute: 0.1
Eosinophils Relative: 0
Eosinophils Relative: 0
Eosinophils Relative: 1
Lymphs Abs: 2.4
Monocytes Absolute: 0.3
Monocytes Absolute: 0.5
Monocytes Relative: 3
Monocytes Relative: 2 — ABNORMAL LOW
Monocytes Relative: 5
Neutro Abs: 7.9 — ABNORMAL HIGH
Neutrophils Relative %: 89 — ABNORMAL HIGH
Neutrophils Relative %: 70
Neutrophils Relative %: 88 — ABNORMAL HIGH

## 2011-06-21 LAB — BASIC METABOLIC PANEL
BUN: 9
BUN: 12
BUN: 7
CO2: 24
CO2: 27
CO2: 25
CO2: 26
Calcium: 8.4
Calcium: 8.9
Calcium: 8.8
Calcium: 9.2
Chloride: 111
Chloride: 104
Chloride: 107
Creatinine, Ser: 0.57
Creatinine, Ser: 0.59
Creatinine, Ser: 0.63
Creatinine, Ser: 0.64
GFR calc Af Amer: >60
GFR calc Af Amer: >60
GFR calc Af Amer: >60
GFR calc Af Amer: >60
GFR calc non Af Amer: >60
GFR calc non Af Amer: >60
Glucose, Bld: 131 — ABNORMAL HIGH
Glucose, Bld: 114 — ABNORMAL HIGH
Glucose, Bld: 116 — ABNORMAL HIGH
Potassium: 4.1
Potassium: 4.2
Sodium: 136
Sodium: 140

## 2011-06-21 LAB — LIPASE, BLOOD
Lipase: 26
Lipase: 37
Lipase: 30

## 2011-06-21 LAB — URINE MICROSCOPIC-ADD ON

## 2011-06-21 LAB — URINE CULTURE
Colony Count: 10000
Colony Count: >=100000
Special Requests: NEGATIVE

## 2011-06-21 LAB — CLOSTRIDIUM DIFFICILE EIA: C difficile Toxins A+B, EIA: NEGATIVE

## 2011-06-22 LAB — COMPREHENSIVE METABOLIC PANEL
ALT: 32
ALT: 40 — ABNORMAL HIGH
AST: 23
AST: 31
Albumin: 3.5
Albumin: 4
Alkaline Phosphatase: 129 — ABNORMAL HIGH
BUN: 13
BUN: 14
BUN: 18
CO2: 16 — ABNORMAL LOW
CO2: 26
Calcium: 9.3
Calcium: 9.8
Calcium: 9.8
Chloride: 106
Creatinine, Ser: 0.69
Creatinine, Ser: 0.79
Creatinine, Ser: 0.86
GFR calc Af Amer: >60
GFR calc Af Amer: >60
GFR calc non Af Amer: >60
GFR calc non Af Amer: >60
Glucose, Bld: 164 — ABNORMAL HIGH
Glucose, Bld: 190 — ABNORMAL HIGH
Potassium: 3.2 — ABNORMAL LOW
Sodium: 137
Sodium: 138
Total Bilirubin: 1.2
Total Bilirubin: 2.1 — ABNORMAL HIGH
Total Protein: 6.3
Total Protein: 7.3

## 2011-06-22 LAB — URINALYSIS, ROUTINE W REFLEX MICROSCOPIC
Bilirubin Urine: NEGATIVE
Glucose, UA: 100 — AB
Glucose, UA: NEGATIVE
Ketones, ur: 40 — AB
Ketones, ur: 40 — AB
Ketones, ur: >80 — AB
Nitrite: NEGATIVE
Nitrite: NEGATIVE
Nitrite: NEGATIVE
Protein, ur: 100 — AB
Protein, ur: 100 — AB
Specific Gravity, Urine: 1.025
Specific Gravity, Urine: 1.029
Specific Gravity, Urine: 1.031 — ABNORMAL HIGH
Urobilinogen, UA: 1
Urobilinogen, UA: 1
pH: 6
pH: 6
pH: 6

## 2011-06-22 LAB — DIFFERENTIAL
Basophils Absolute: 0
Basophils Absolute: 0
Basophils Relative: 0
Eosinophils Absolute: 0
Eosinophils Absolute: 0
Eosinophils Relative: 0
Eosinophils Relative: 1
Lymphocytes Relative: 11 — ABNORMAL LOW
Lymphocytes Relative: 27
Lymphs Abs: 0.9
Lymphs Abs: 1.3
Lymphs Abs: 2.3
Monocytes Absolute: 0.2
Monocytes Absolute: 0.3
Monocytes Relative: 2 — ABNORMAL LOW
Neutro Abs: 10.3 — ABNORMAL HIGH
Neutro Abs: 11.1 — ABNORMAL HIGH
Neutro Abs: 5.8
Neutrophils Relative %: 87 — ABNORMAL HIGH
Neutrophils Relative %: 90 — ABNORMAL HIGH

## 2011-06-22 LAB — BASIC METABOLIC PANEL
BUN: 10
CO2: 24
CO2: 22
Calcium: 8.6
Calcium: 8.5
Chloride: 114 — ABNORMAL HIGH
Creatinine, Ser: 0.6
Creatinine, Ser: 0.56
GFR calc Af Amer: >60
GFR calc Af Amer: >60
GFR calc non Af Amer: >60
GFR calc non Af Amer: >60
Glucose, Bld: 98
Glucose, Bld: 96
Potassium: 3.3 — ABNORMAL LOW
Sodium: 140
Sodium: 141

## 2011-06-22 LAB — CBC
HCT: 39.4
HCT: 48 — ABNORMAL HIGH
Hemoglobin: 11.3 — ABNORMAL LOW
Hemoglobin: 15.9 — ABNORMAL HIGH
MCHC: 33.9
MCHC: 33.1
MCHC: 34
MCHC: 34.2
MCV: 91.4
MCV: 92.3
MCV: 93.1
Platelets: 214
RBC: 3.59 — ABNORMAL LOW
RBC: 3.75 — ABNORMAL LOW
RBC: 5.15 — ABNORMAL HIGH
RDW: 13.5
RDW: 12.8
RDW: 13.3
RDW: 13.4
WBC: 6.5
WBC: 8.4

## 2011-06-22 LAB — URINE MICROSCOPIC-ADD ON

## 2011-06-22 LAB — URINE CULTURE: Colony Count: 2000

## 2011-06-22 LAB — LIPASE, BLOOD
Lipase: 39
Lipase: 50

## 2011-06-26 LAB — TSH: TSH: 1.561

## 2011-06-26 LAB — AMYLASE: Amylase: 60

## 2011-06-26 LAB — BASIC METABOLIC PANEL
Chloride: 109
GFR calc non Af Amer: >60
Glucose, Bld: 171 — ABNORMAL HIGH
Potassium: 4
Sodium: 141

## 2011-06-27 LAB — CBC
HCT: 39.2
HCT: 47.7 — ABNORMAL HIGH
Platelets: 168
Platelets: 247
RBC: 5.23 — ABNORMAL HIGH
WBC: 10.1
WBC: 13.5 — ABNORMAL HIGH

## 2011-06-27 LAB — DIFFERENTIAL
Basophils Absolute: 0
Basophils Relative: 0
Eosinophils Absolute: 0
Eosinophils Relative: 0
Eosinophils Relative: 0
Lymphocytes Relative: 22
Lymphs Abs: 2.2
Monocytes Absolute: 0.3
Neutrophils Relative %: 74

## 2011-06-27 LAB — URINALYSIS, ROUTINE W REFLEX MICROSCOPIC
Ketones, ur: 15 — AB
Protein, ur: 100 — AB
Urobilinogen, UA: 1

## 2011-06-27 LAB — URINE MICROSCOPIC-ADD ON

## 2011-06-27 LAB — COMPREHENSIVE METABOLIC PANEL
AST: 28
Albumin: 4.1
Alkaline Phosphatase: 129 — ABNORMAL HIGH
CO2: 22
Chloride: 108
GFR calc Af Amer: >60
Potassium: 3.6
Total Bilirubin: 1.4 — ABNORMAL HIGH

## 2011-06-27 LAB — BASIC METABOLIC PANEL
BUN: 18
GFR calc non Af Amer: >60
Potassium: 3.7
Sodium: 141

## 2011-06-27 LAB — HEMOGLOBIN A1C: Hgb A1c MFr Bld: 6.8 — ABNORMAL HIGH

## 2011-06-28 LAB — COMPREHENSIVE METABOLIC PANEL
Alkaline Phosphatase: 142 — ABNORMAL HIGH
BUN: 11
CO2: 21
Chloride: 108
Glucose, Bld: 226 — ABNORMAL HIGH
Potassium: 3.6
Total Bilirubin: 1.3 — ABNORMAL HIGH

## 2011-06-28 LAB — BASIC METABOLIC PANEL
BUN: 6
CO2: 29
Calcium: 8.5
Calcium: 8.6
Chloride: 108
Creatinine, Ser: 0.59
Creatinine, Ser: 0.62
GFR calc Af Amer: >60
GFR calc non Af Amer: >60
Glucose, Bld: 157 — ABNORMAL HIGH
Glucose, Bld: 165 — ABNORMAL HIGH
Potassium: 3.2 — ABNORMAL LOW
Potassium: 3.8
Sodium: 138
Sodium: 142

## 2011-06-28 LAB — CBC
HCT: 34.6 — ABNORMAL LOW
HCT: 50.3 — ABNORMAL HIGH
Hemoglobin: 11.7 — ABNORMAL LOW
Hemoglobin: 17 — ABNORMAL HIGH
MCHC: 33.9
MCV: 92.6
MCV: 93
RBC: 4.27
RBC: 5.5 — ABNORMAL HIGH
RDW: 13.9
WBC: 10.4
WBC: 12.9 — ABNORMAL HIGH

## 2011-06-28 LAB — DIFFERENTIAL
Basophils Absolute: 0
Basophils Relative: 0
Eosinophils Absolute: 0
Lymphs Abs: 2
Monocytes Relative: 5
Neutro Abs: 11.5 — ABNORMAL HIGH
Neutrophils Relative %: 74
Neutrophils Relative %: 89 — ABNORMAL HIGH

## 2011-06-28 LAB — URINALYSIS, ROUTINE W REFLEX MICROSCOPIC
Ketones, ur: 40 — AB
Protein, ur: 30 — AB
Urobilinogen, UA: 0.2

## 2011-06-28 LAB — URINE MICROSCOPIC-ADD ON

## 2011-07-12 ENCOUNTER — Other Ambulatory Visit: Payer: Self-pay | Admitting: Cardiology

## 2011-07-12 DIAGNOSIS — I2510 Atherosclerotic heart disease of native coronary artery without angina pectoris: Secondary | ICD-10-CM

## 2011-07-13 ENCOUNTER — Encounter: Payer: Self-pay | Admitting: Cardiology

## 2011-07-17 ENCOUNTER — Encounter: Payer: Self-pay | Admitting: Cardiovascular Disease

## 2011-07-18 ENCOUNTER — Other Ambulatory Visit (HOSPITAL_COMMUNITY): Payer: Self-pay

## 2011-07-24 ENCOUNTER — Inpatient Hospital Stay (HOSPITAL_COMMUNITY): Admission: RE | Admit: 2011-07-24 | Payer: Self-pay | Source: Ambulatory Visit

## 2011-08-05 ENCOUNTER — Emergency Department: Payer: Self-pay | Admitting: *Deleted

## 2011-08-12 IMAGING — CT CT ABD-PELV W/ CM
2 of 3 series · 14 of 42 positions shown, 18 images · IV contrast (isovue)
Comparison: 07/27/2009, 04/16/2009, 06/07/2008

REASON FOR EXAM: (1) Epigastric pain; (2) abdominal pain
COMMENTS:

PROCEDURE:     CT  - CT ABDOMEN / PELVIS  W  - September 18, 2009  [DATE]
RESULT:     History: Epigastric pain
TECHNIQUE: Multiple axial images of the abdomen and pelvis were performed
from the lung bases to the pubic symphysis, with p.o. contrast and with 100
ml of Isovue 370 intravenous contrast.

[Series 2: 3mm soft tissue · axial · 0.79mm/px · z∈[-1492,-1054]mm · 11 of 167 slices shown, 15 images]
[im 14/167  soft-tissue]
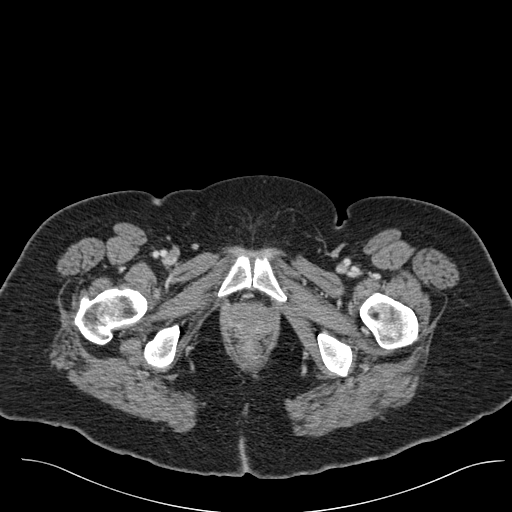
[im 14/167  bone]
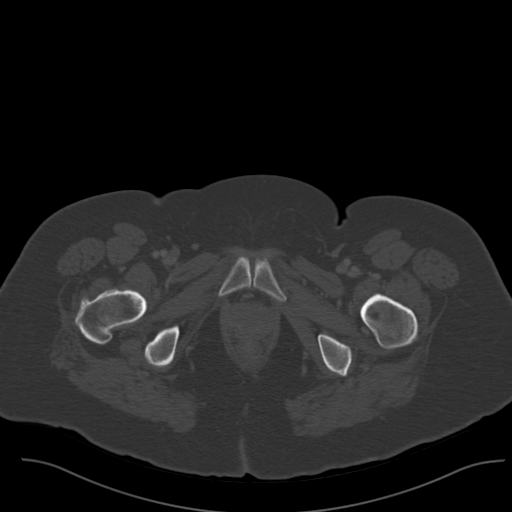
[im 28/167  soft-tissue]
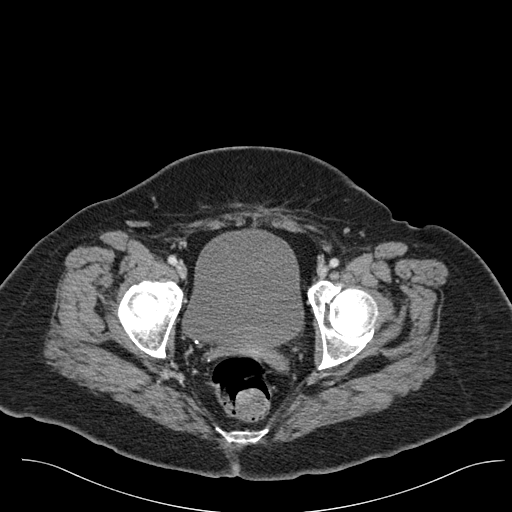
[im 49/167  soft-tissue]
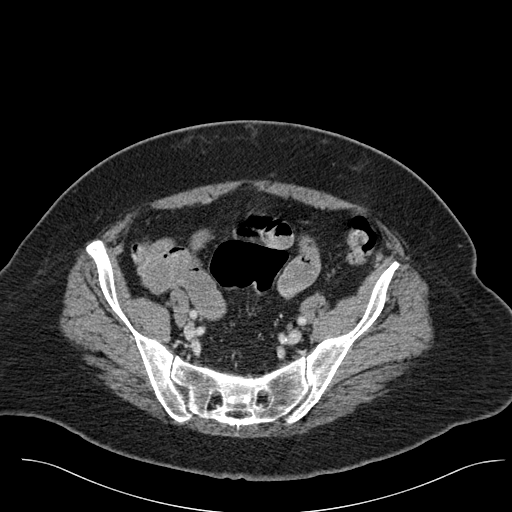
[im 63/167  soft-tissue]
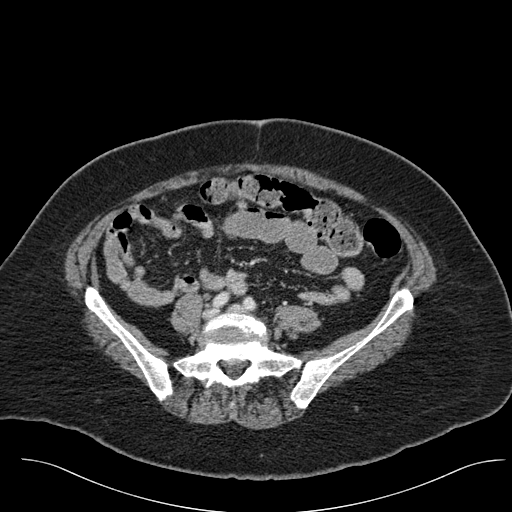
[im 84/167  soft-tissue]
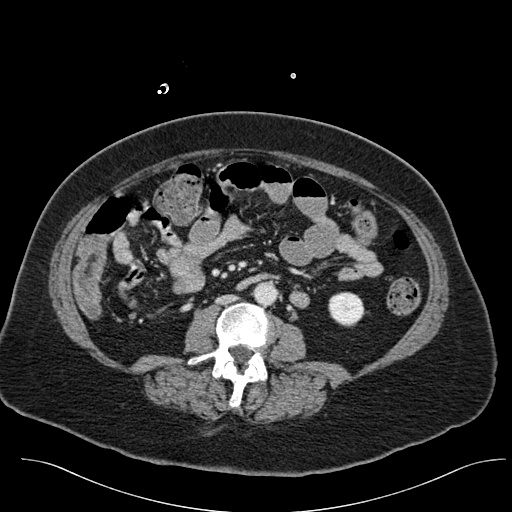
[im 104/167  soft-tissue]
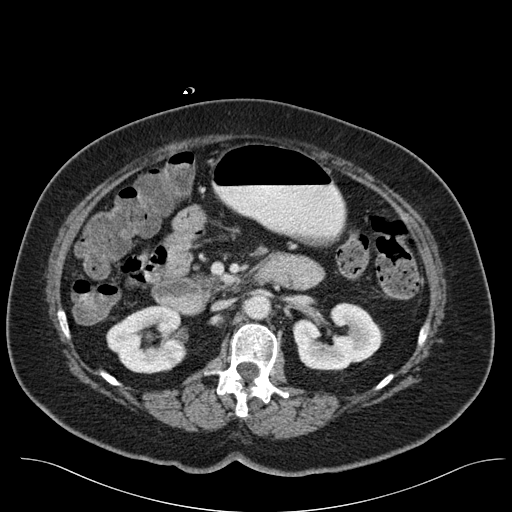
[im 118/167  soft-tissue]
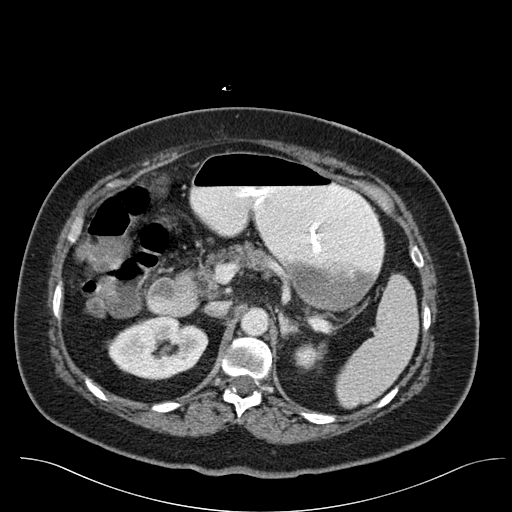
[im 139/167  soft-tissue]
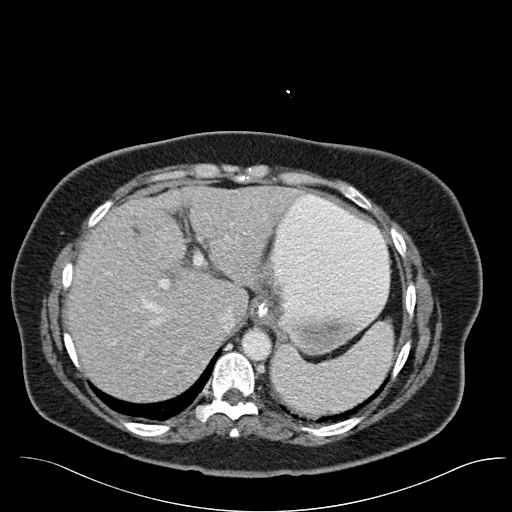
[im 139/167  lung]
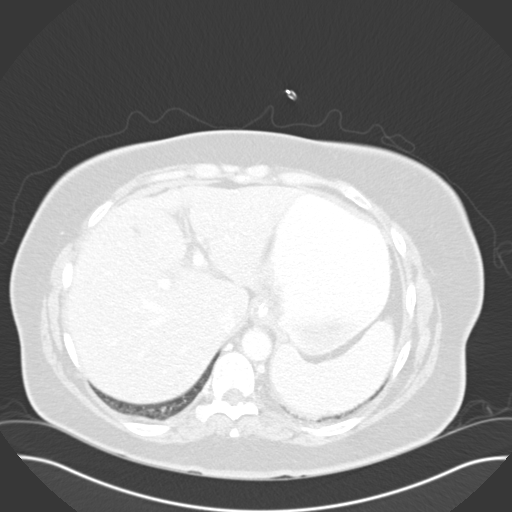
[im 146/167  lung]
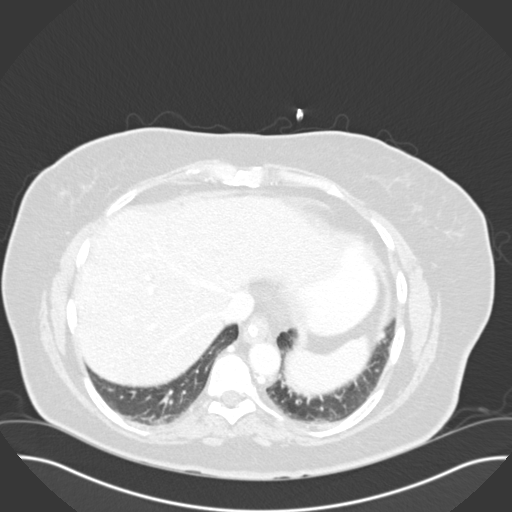
[im 153/167  soft-tissue]
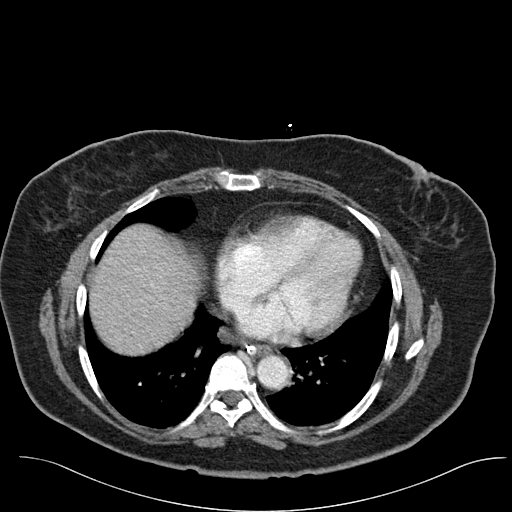
[im 153/167  lung]
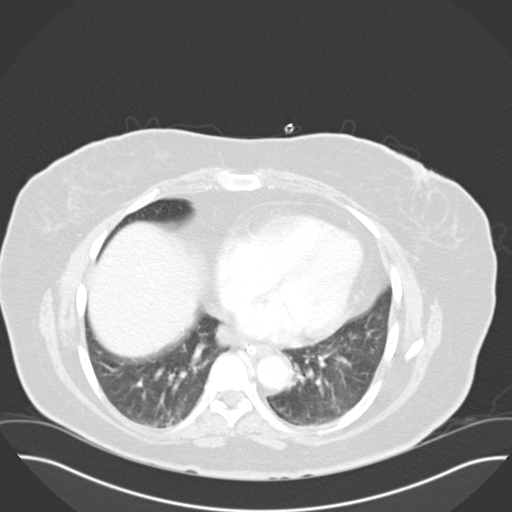
[im 153/167  bone]
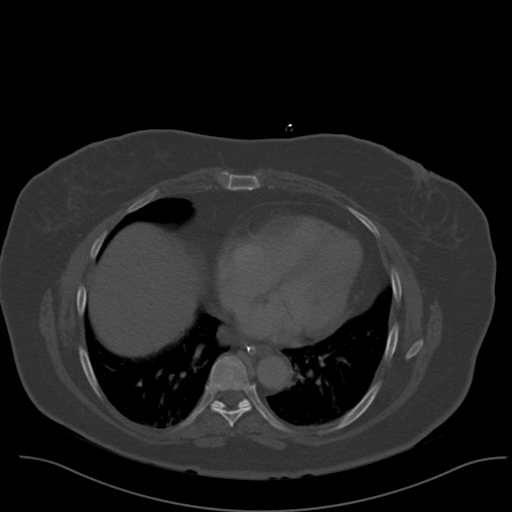
[im 160/167  lung]
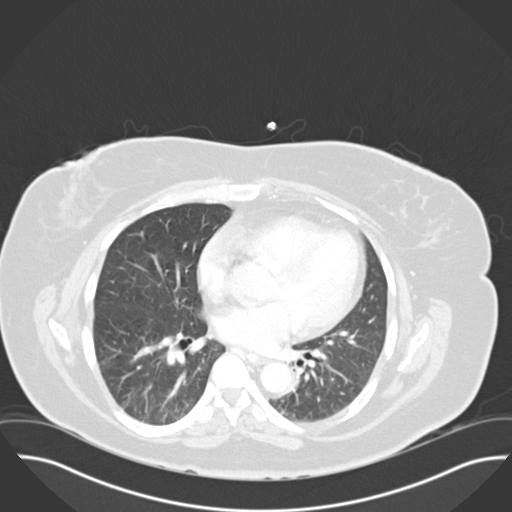

[Series 602: coronals · coronal · 0.99mm/px · 3 of 86 slices shown]
[im 29/86  soft-tissue]
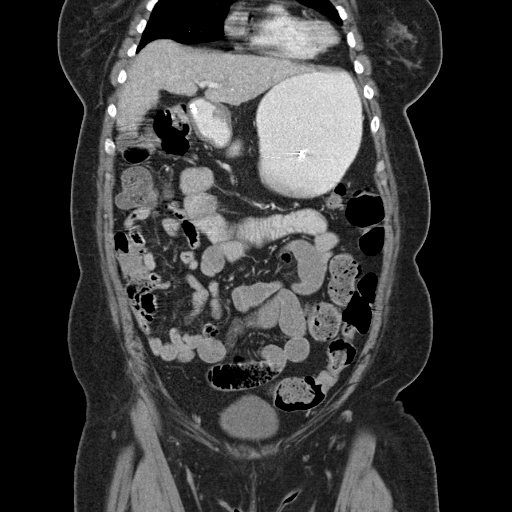
[im 38/86  soft-tissue]
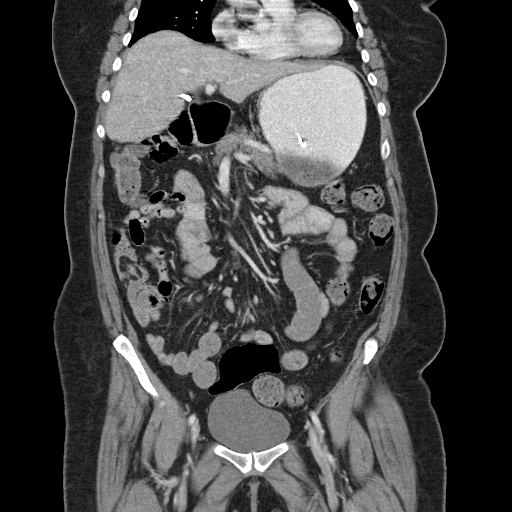
[im 48/86  soft-tissue]
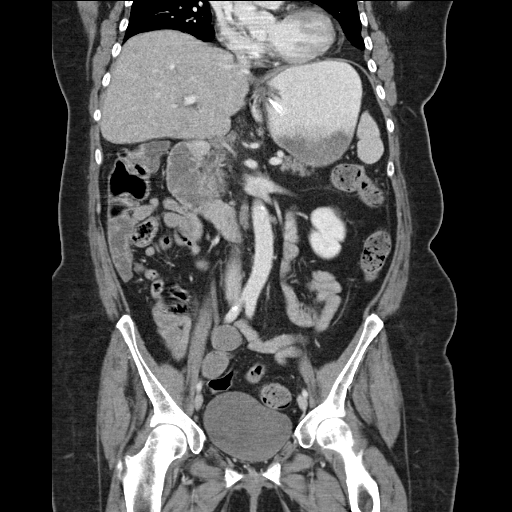

[14 of 42 positions shown; findings below may reference images not displayed]

FINDINGS: The lung bases are clear. There is no pneumothorax. The heart size is
normal.

The liver demonstrates no focal abnormality. There is no intrahepatic or
extrahepatic biliary ductal dilatation. The gallbladder is surgically
absent. The spleen demonstrates no focal abnormality. The adrenal glands,
and pancreas are normal. The bladder is unremarkable. Followup radiography
is recommended. There is a nonobstructing right renal calculus. The kidneys
are otherwise unremarkable.

There is a nasogastric tube within the stomach. There is severe gastric
distention with a majority contrast present within the stomach. There is a
mildly dilated loop of small bowel in the mid left abdomen. There multiple
fluid-filled loops of proximal and mid small bowel with multiple air-fluid
levels. The distal small bowel is relatively decompressed. The a there is
fluid present within the ascending colon which may resent diarrhea. There is
no pneumoperitoneum, pneumatosis, or portal venous gas. There is no
abdominal or pelvic free fluid. There is no lymphadenopathy.

The abdominal aorta is normal in caliber .

The osseous structures are unremarkable.
IMPRESSION: There is a nasogastric tube within the stomach. There is severe gastric
distention with a majority contrast present within the stomach. There is a
mildly dilated loop of small bowel in the mid left abdomen. There multiple
fluid-filled loops of proximal and mid small bowel with multiple air-fluid
levels. The distal small bowel is relatively decompressed. The appearance is
concerning for a ileus secondary to enterocolitis versus partial small bowel
obstruction.

## 2011-08-12 IMAGING — CR DG CHEST 1V
1 series · 1 of 1 positions shown · non-contrast
Comparison: none

REASON FOR EXAM: UPRIGHT FOR ABD PAIN & VOMITING
COMMENTS:

PROCEDURE:     DXR - DXR CHEST 1 VIEWAP OR PA  - September 18, 2009  [DATE]
RESULT:     Comparison: 07/26/2009

[view not recorded]
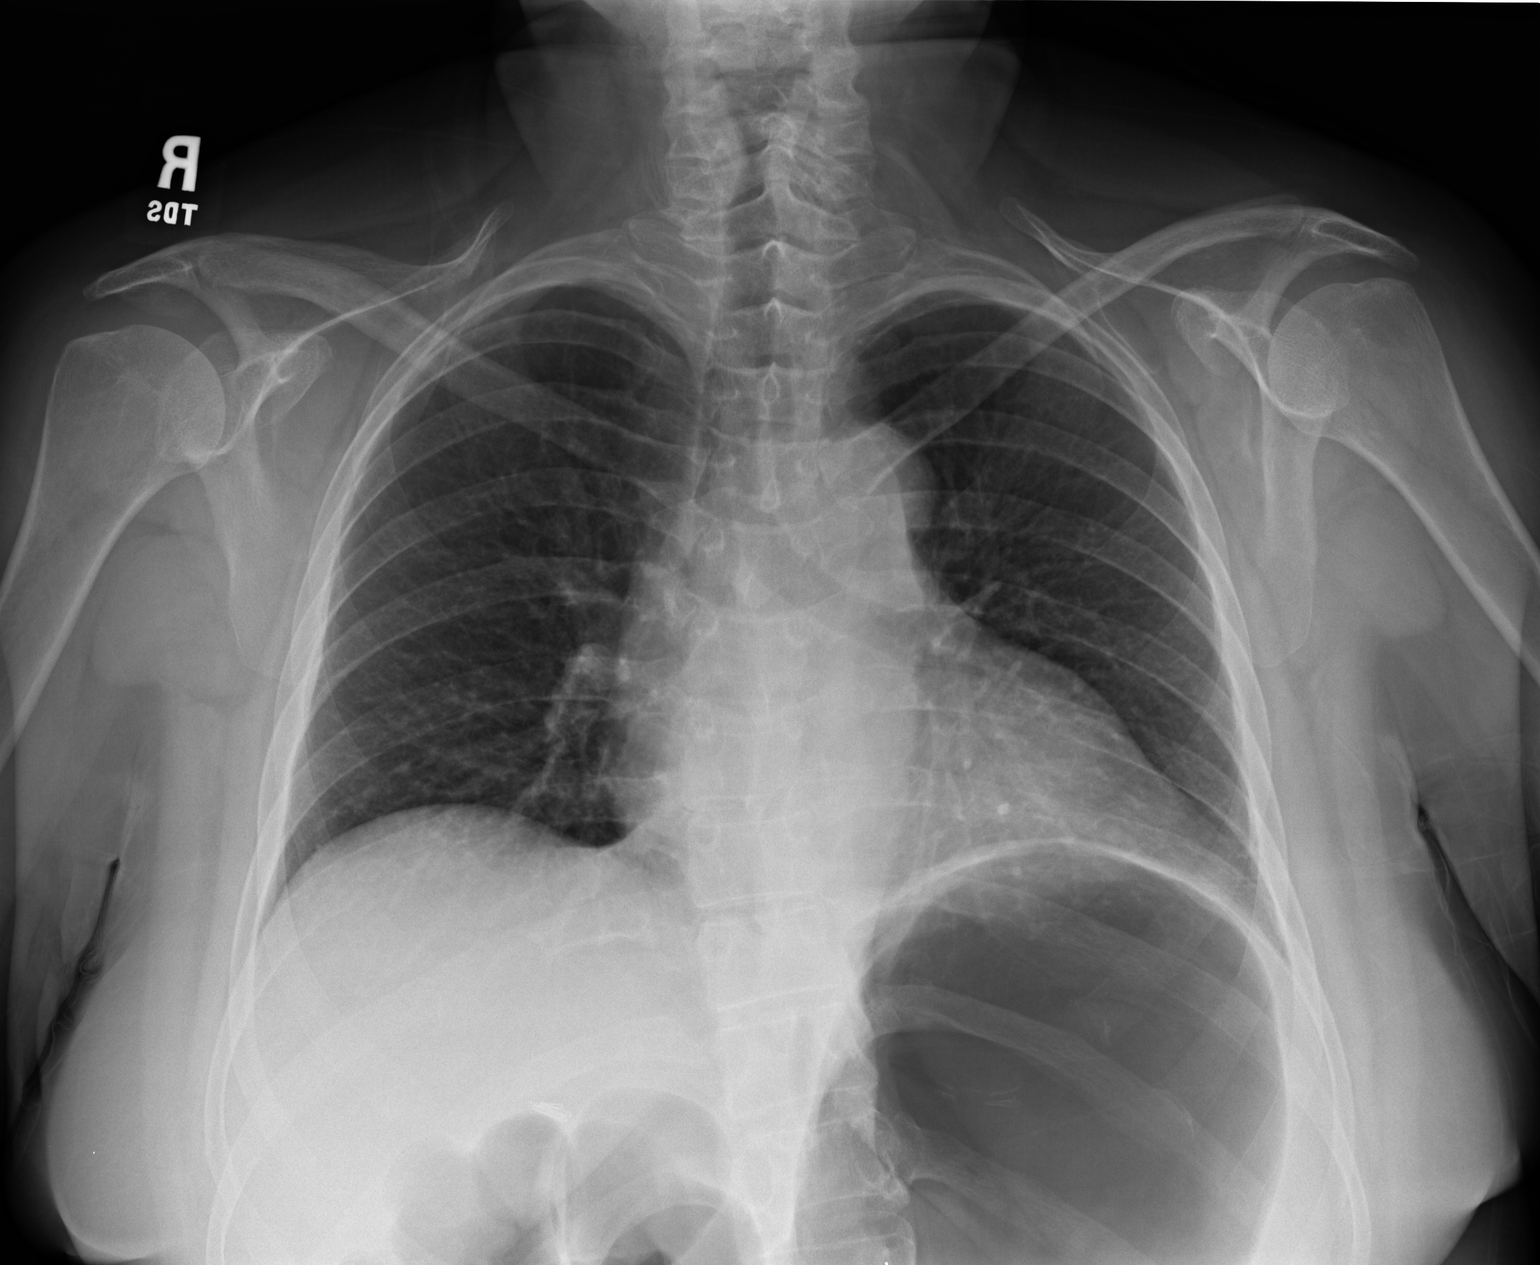

[1 of 1 positions shown; findings below may reference images not displayed]

FINDINGS: Single portable AP chest radiograph is provided. There is no focal
parenchymal opacity, pleural effusion, or pneumothorax. Normal
cardiomediastinal silhouette. The osseous structures are unremarkable.

The stomach is severely distended with air.
IMPRESSION: No acute disease of the chest.

Severe gaseous distention of the stomach.

## 2011-08-12 IMAGING — CR DG ABDOMEN 2V
1 series · 2 of 2 positions shown · non-contrast
Comparison: none

REASON FOR EXAM: N/V ABD PAIN
COMMENTS:   May transport without cardiac monitor

PROCEDURE:     DXR - DXR ABDOMEN 2 V FLAT AND ERECT  - September 18, 2009  [DATE]
RESULT:     Comparisons:  None

[Series 1: view not recorded · 0.17mm/px · 2 of 2 slices shown]
[im 1/2]
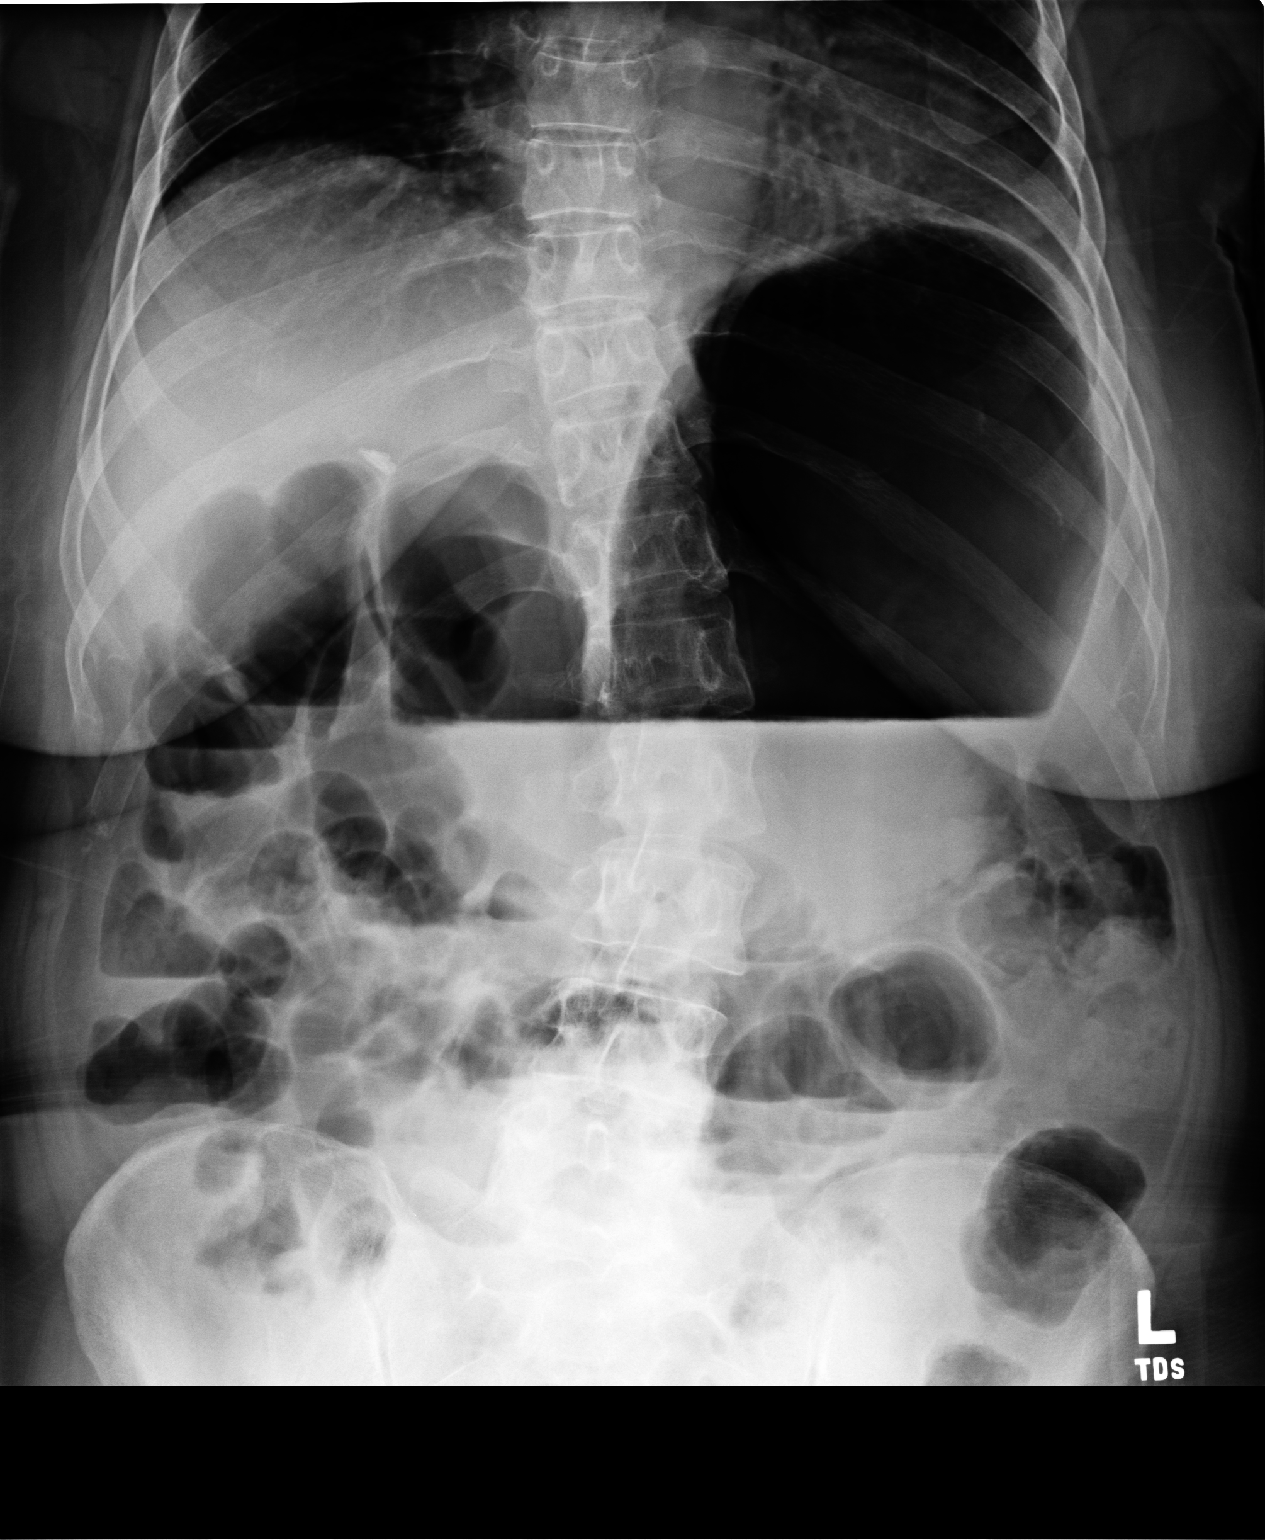
[im 2/2]
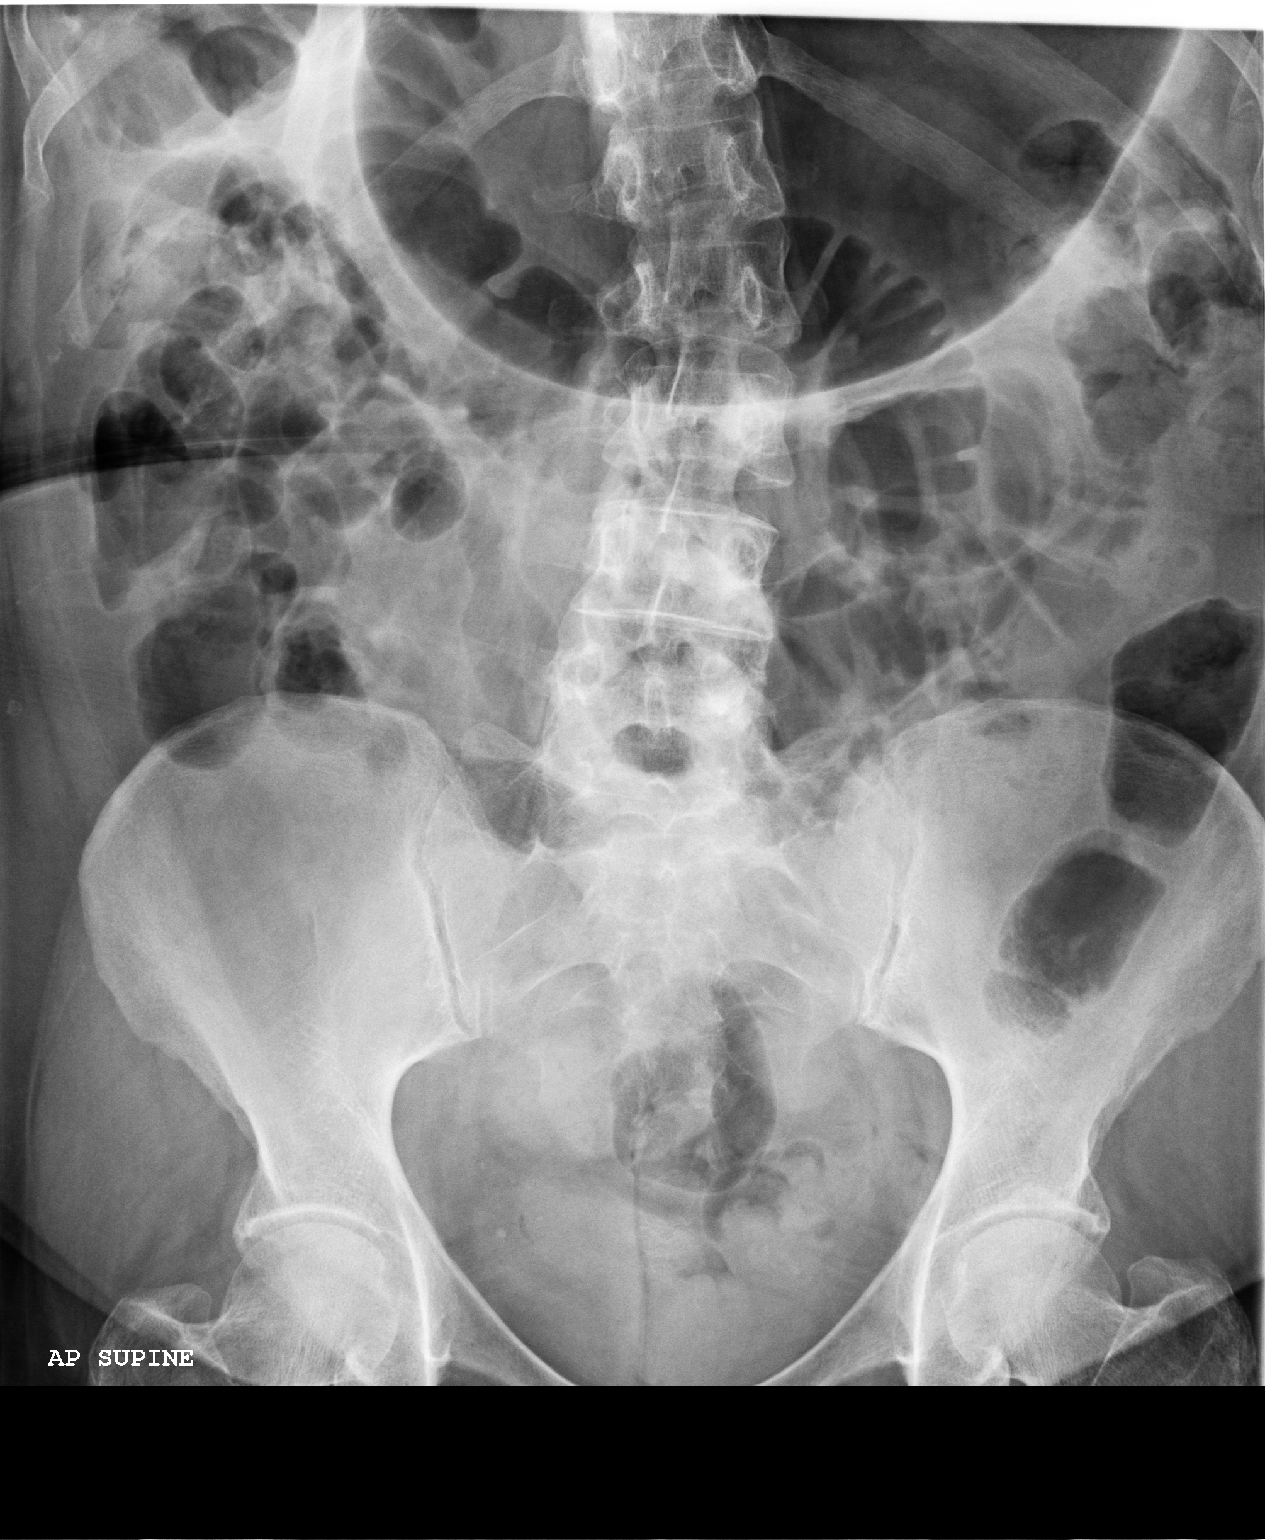

[2 of 2 positions shown; findings below may reference images not displayed]

FINDINGS: Supine and upright views of the abdomen are provided.

The stomach is severely distended with a large air-fluid level. There is air
seen within the colon. There is gaseous distention of multiple small bowel
loops in the mid abdomen. There is no pathologic calcification along the
expected course of the ureters. There is no evidence of pneumoperitoneum,
portal venous gas, or pneumatosis.

The osseous structures are unremarkable.
IMPRESSION: Severely dilated stomach as can be seen with gastric outlet obstruction.
There is no nasogastric tube present.

## 2011-08-15 IMAGING — CR DG ABDOMEN 2V
1 series · 3 of 3 positions shown · non-contrast
Comparison: none

REASON FOR EXAM: f/u partial bowel obstruction
COMMENTS:

[Series 1: view not recorded · 0.17mm/px · 3 of 3 slices shown]
[im 1/3]
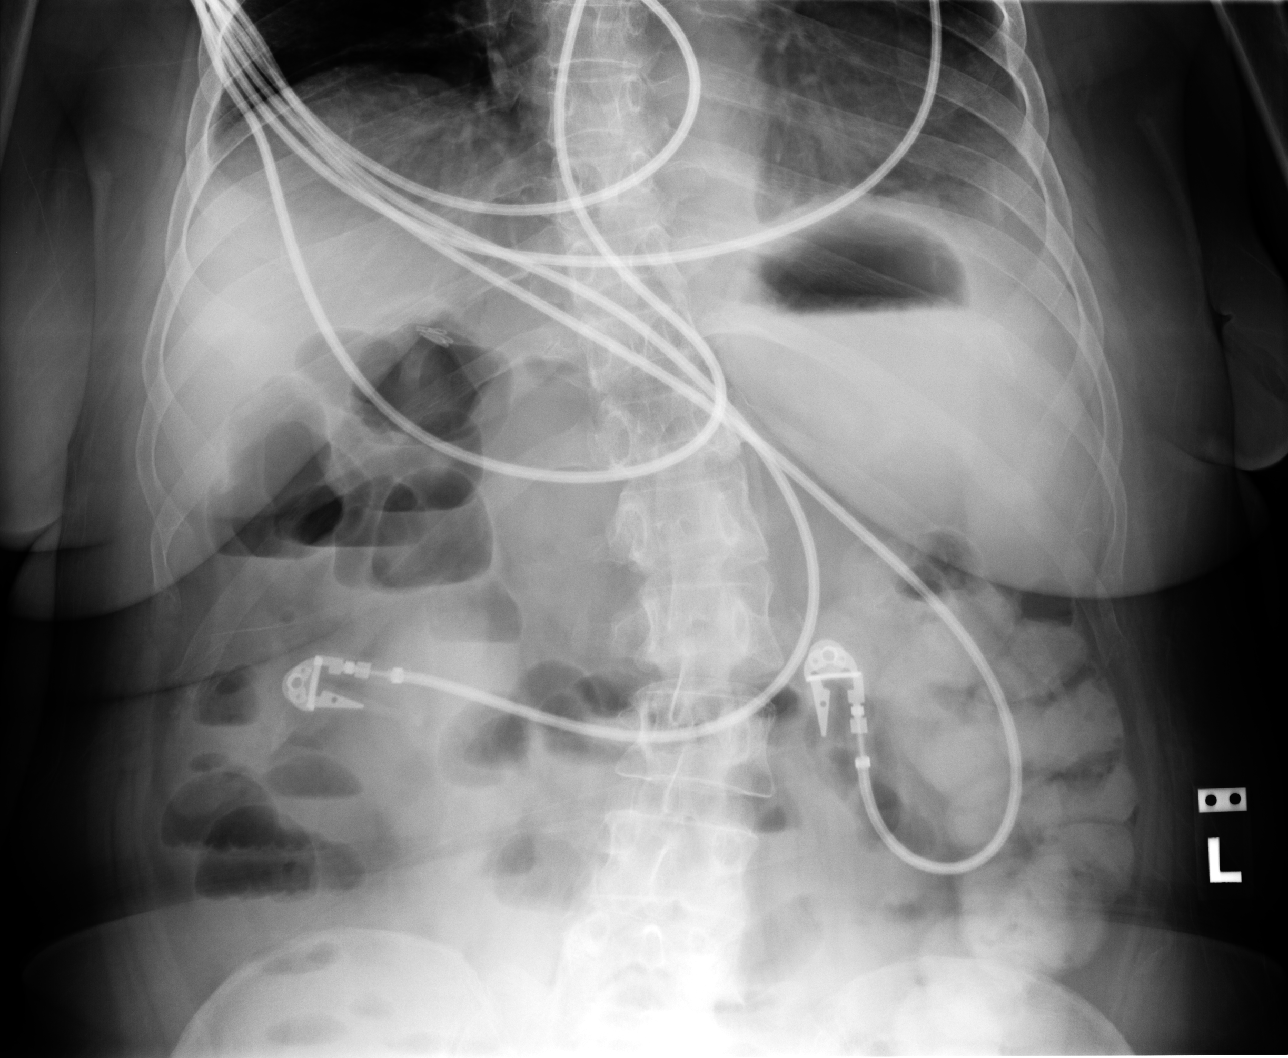
[im 2/3]
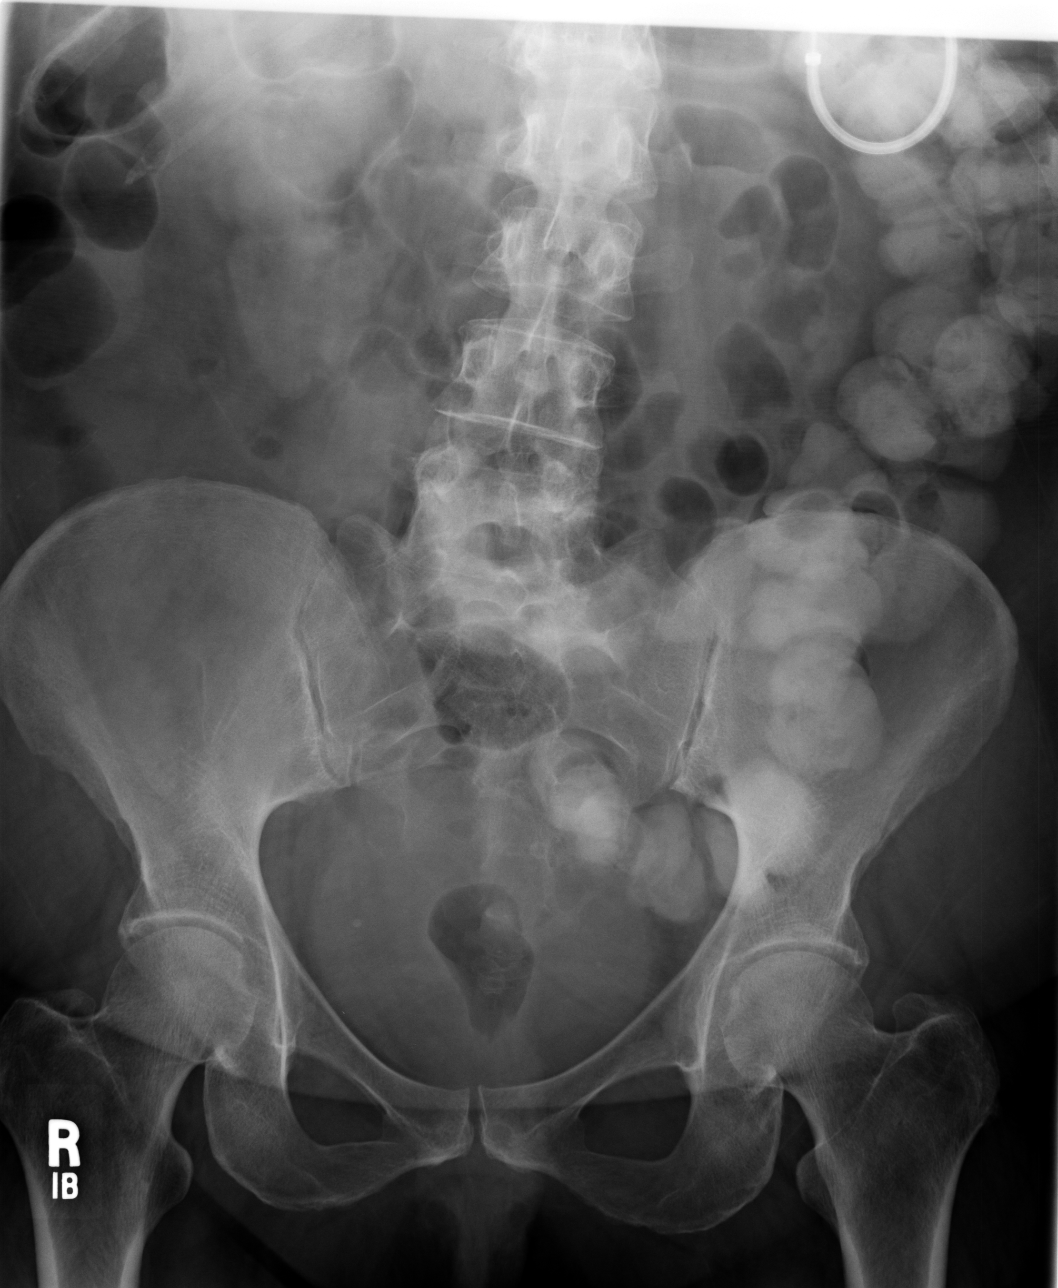
[im 3/3]
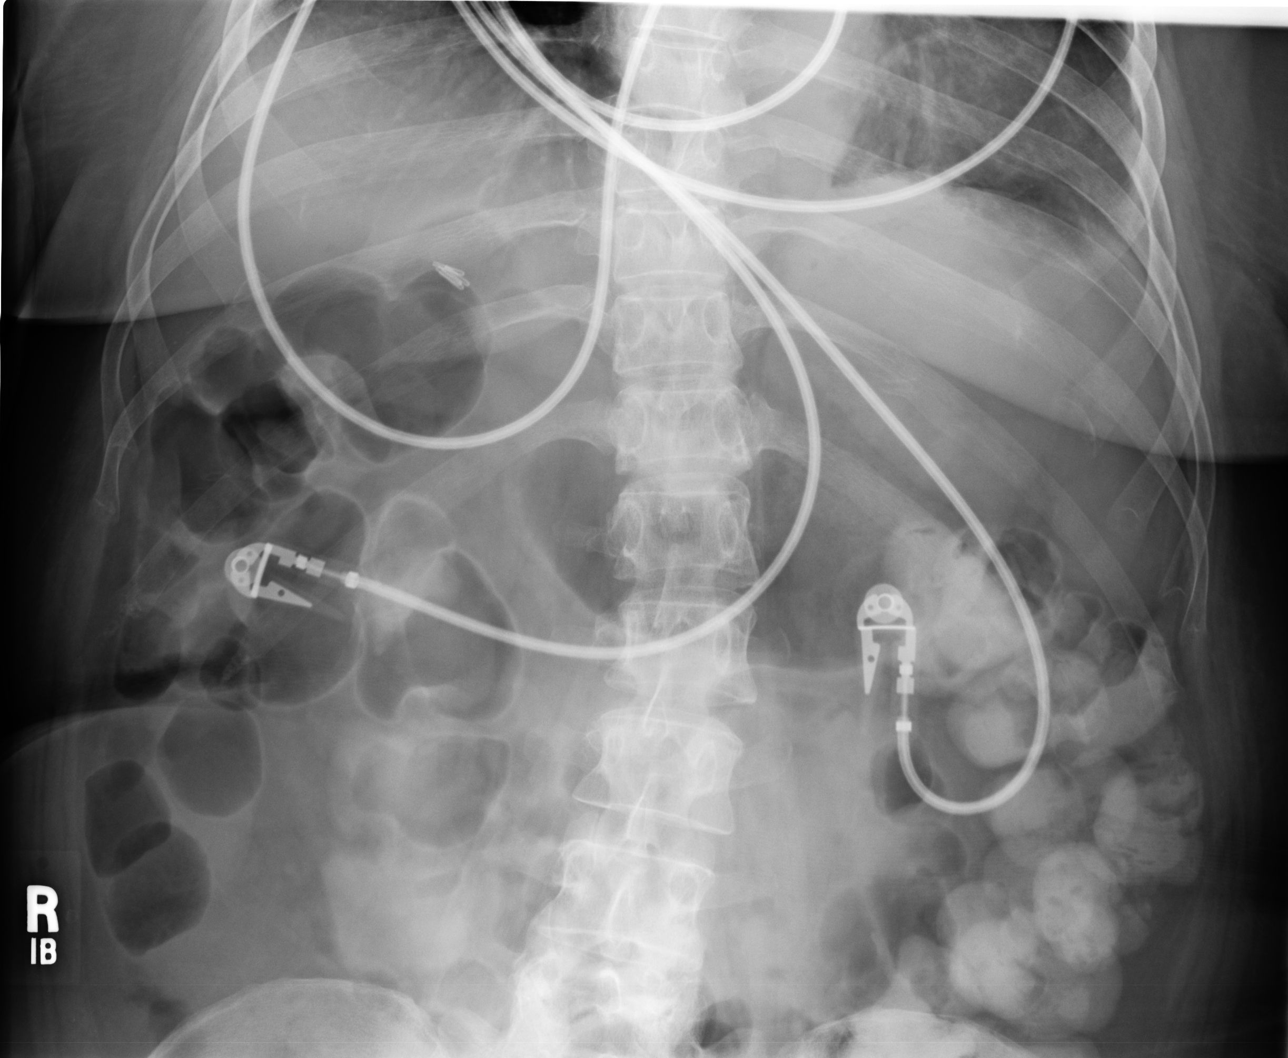

[3 of 3 positions shown; findings below may reference images not displayed]

PROCEDURE:     DXR - DXR ABDOMEN 2 V FLAT AND ERECT  - September 21, 2009  [DATE]

RESULT:     Flat and erect views were obtained and are compared to the prior
exam of 09/19/2009.

No subdiaphragmatic free air is seen. There are again noted scattered fluid
levels within mildly dilated loops of small bowel. There has been little or
no appreciable interval change since the prior exam. The previously present
nasogastric tube has been removed. The stomach appears to be moderately
fluid filled. Contrast material is visualized in nondilated colon, primarily
the descending colon.
IMPRESSION: 1.  There has been little or no appreciable interval change since the prior
exam.
2.  There persist small bowel loops most compatible with persistent ileus
pattern.
3.  The previously present nasogastric tube is no longer seen.
4.  There is a moderate amount of fluid in the stomach.

## 2011-08-23 ENCOUNTER — Encounter (HOSPITAL_COMMUNITY): Payer: Self-pay

## 2011-08-23 ENCOUNTER — Ambulatory Visit (HOSPITAL_COMMUNITY)
Admission: RE | Admit: 2011-08-23 | Discharge: 2011-08-23 | Disposition: A | Discharge status: Home / Self Care | Payer: Medicare Other | Source: Ambulatory Visit | Attending: Cardiology | Admitting: Cardiology

## 2011-08-23 DIAGNOSIS — R079 Chest pain, unspecified: Secondary | ICD-10-CM | POA: Insufficient documentation

## 2011-08-23 DIAGNOSIS — I2510 Atherosclerotic heart disease of native coronary artery without angina pectoris: Secondary | ICD-10-CM

## 2011-08-23 DIAGNOSIS — R000 Tachycardia, unspecified: Secondary | ICD-10-CM | POA: Insufficient documentation

## 2011-08-23 DIAGNOSIS — I517 Cardiomegaly: Secondary | ICD-10-CM | POA: Insufficient documentation

## 2011-08-23 HISTORY — DX: Diverticulitis of intestine, part unspecified, without perforation or abscess without bleeding: K57.92

## 2011-08-23 HISTORY — DX: Hypothyroidism, unspecified: E03.9

## 2011-08-23 HISTORY — DX: Gastric ulcer, unspecified as acute or chronic, without hemorrhage or perforation: K25.9

## 2011-08-23 HISTORY — DX: Reserved for concepts with insufficient information to code with codable children: IMO0002

## 2011-08-23 MED ORDER — METOPROLOL TARTRATE 1 MG/ML IV SOLN: 15.0000 mg | INTRAVENOUS | Status: DC | PRN

## 2011-08-23 MED ORDER — IOHEXOL 350 MG/ML SOLN
80.0000 mL | Freq: Once | INTRAVENOUS | Status: AC | PRN
  Administered 2011-08-23: 80 mL via INTRAVENOUS

## 2011-08-23 MED ORDER — METOPROLOL TARTRATE 1 MG/ML IV SOLN
INTRAVENOUS | Status: AC
  Administered 2011-08-23 (×3): 5 mg via INTRAVENOUS
  Filled 2011-08-23: qty 25

## 2011-08-23 MED ORDER — NITROGLYCERIN 0.4 MG SL SUBL
SUBLINGUAL_TABLET | SUBLINGUAL | Status: AC
  Administered 2011-08-23: 0.4 mg via ORAL
  Filled 2011-08-23: qty 25

## 2011-10-01 IMAGING — MR MRI LUMBAR SPINE WITHOUT CONTRAST
4 of 5 series · 24 of 48 positions shown · non-contrast
Comparison: 12/30/2007

REASON FOR EXAM: low back pain lumbar radiculitis
COMMENTS:

PROCEDURE:     MMR - MMR LUMBAR SPINE WO CONTRAST  - November 07, 2009  [DATE]
RESULT:     MRI LUMBAR SPINE WITHOUT CONTRAST
HISTORY: Low back pain
TECHNIQUE: Multiplanar and multisequence MRI of the lumbar spine were
obtained, without administration of IV contrast.

[Series 2: T2 · sagittal · 4.0mm · 0.88mm/px · 6 of 14 slices shown (1 of 2)]
[im 1/14]
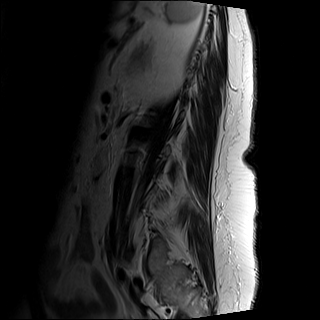
[im 3/14]
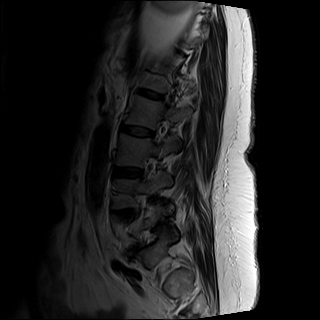
[im 6/14]
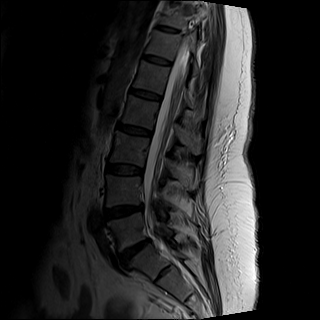
[im 8/14]
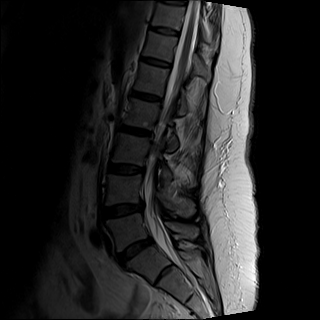
[im 11/14]
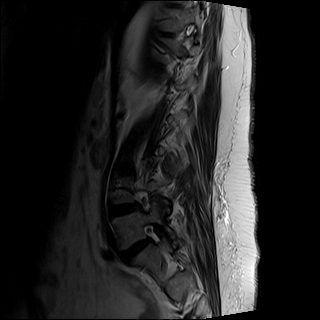
[im 14/14]
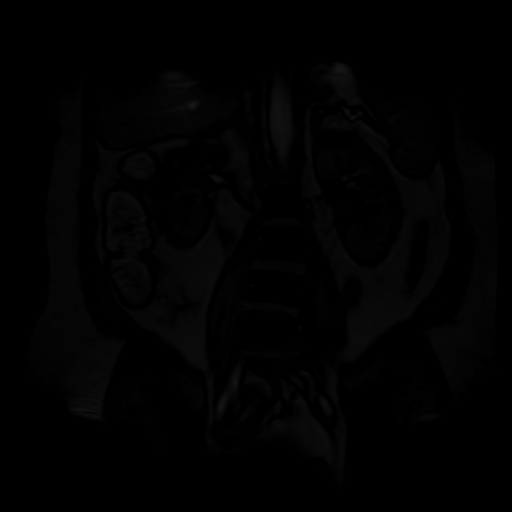

[Series 3: T1 · sagittal · 4.0mm · 0.44mm/px · 6 of 14 slices shown (1 of 2)]
[im 1/14]
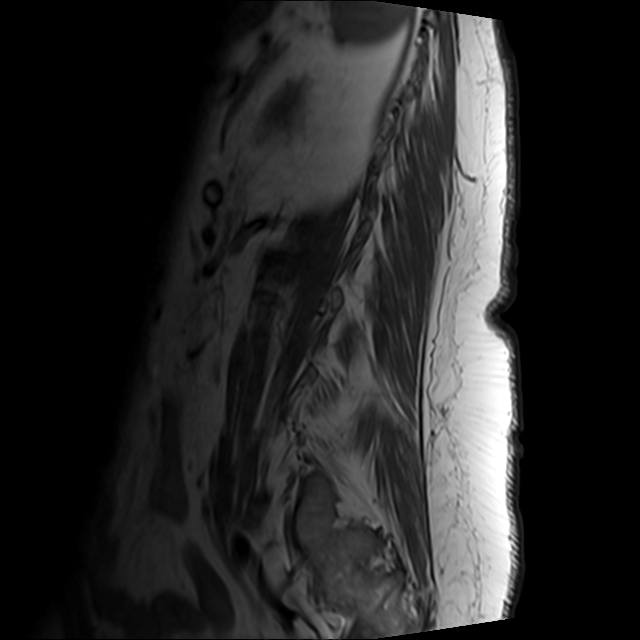
[im 3/14]
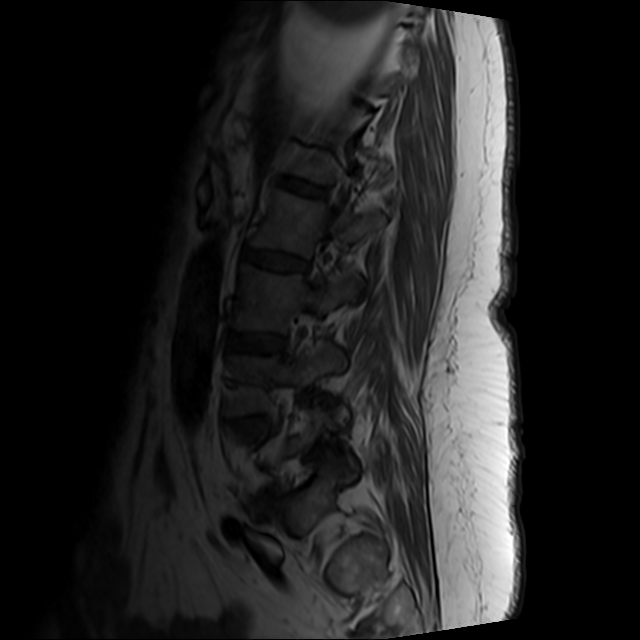
[im 6/14]
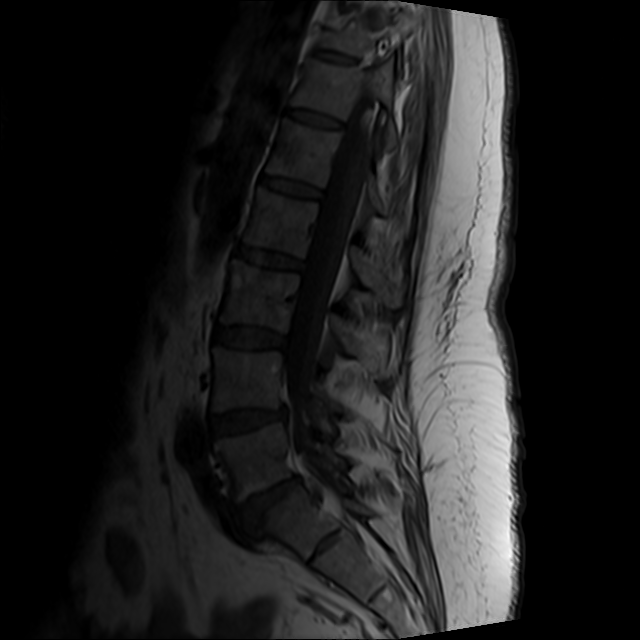
[im 8/14]
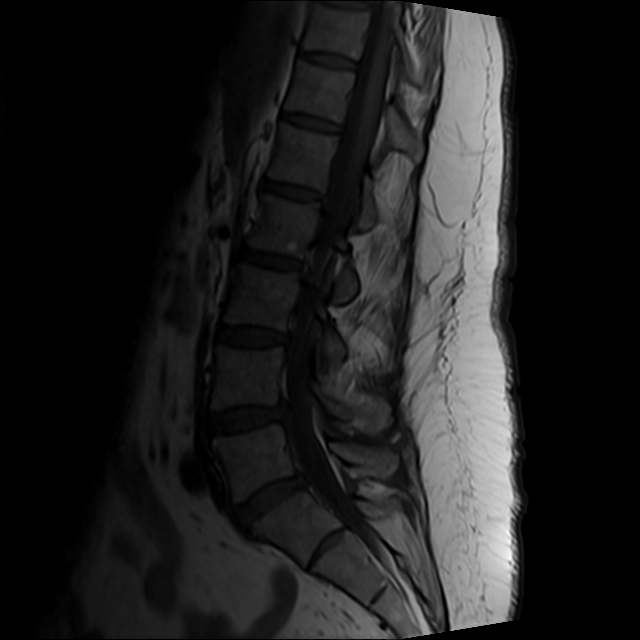
[im 11/14]
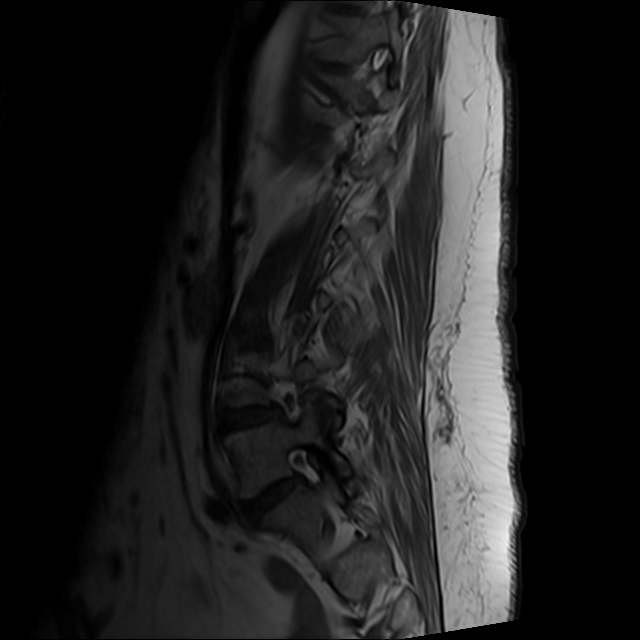
[im 14/14]
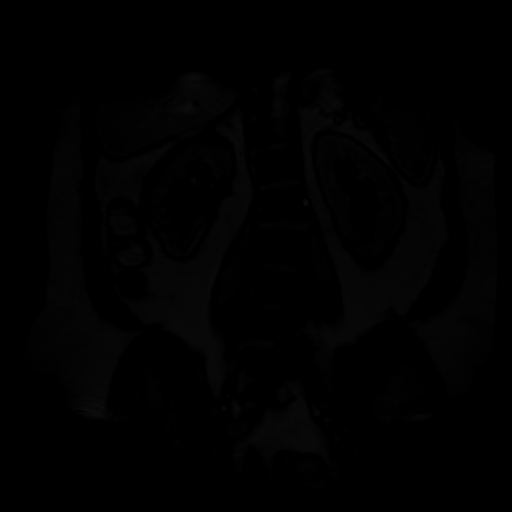

[Series 6: T1 · axial · 4.0mm · 0.31mm/px · z∈[-112,+84]mm · 3 of 38 slices shown (2 of 2)]
[im 6/38]
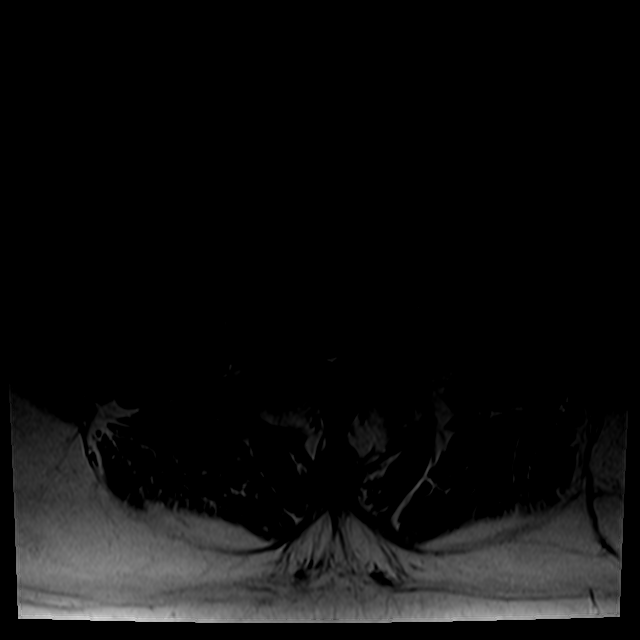
[im 19/38]
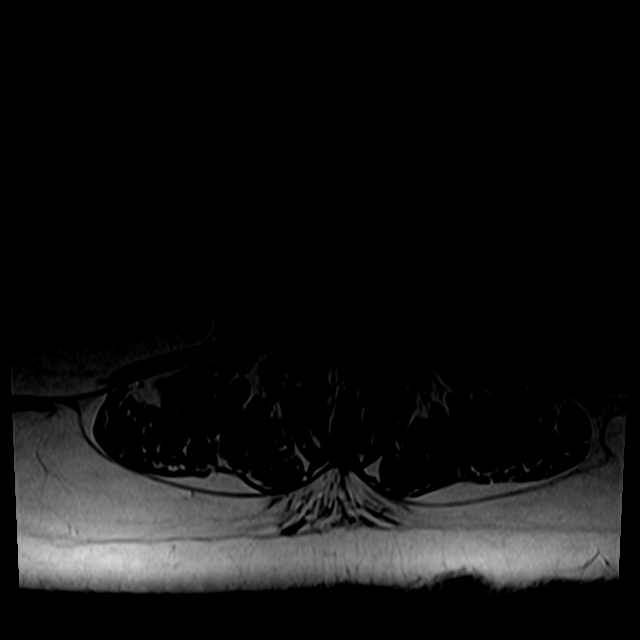
[im 32/38]
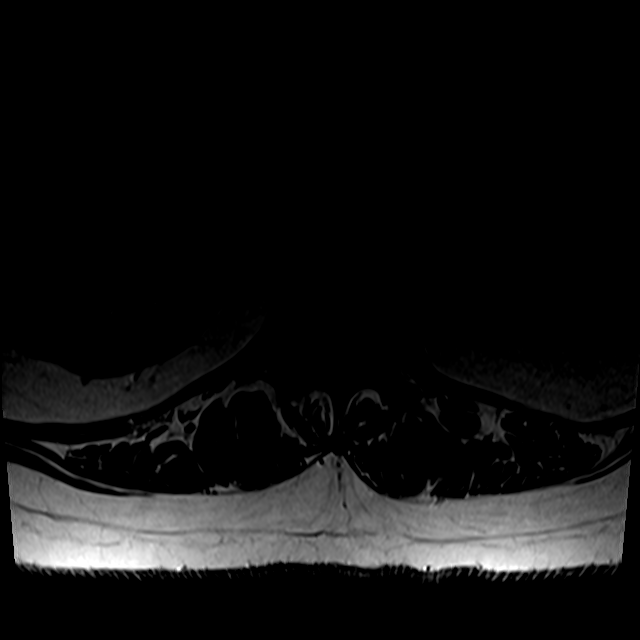

[Series 7: T2 · axial · 4.0mm · 0.78mm/px · z∈[-133,+139]mm · 9 of 38 slices shown (2 of 2)]
[im 1/38]
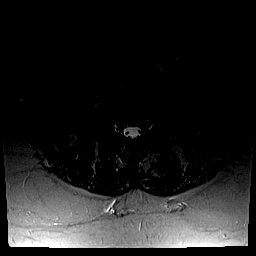
[im 6/38]
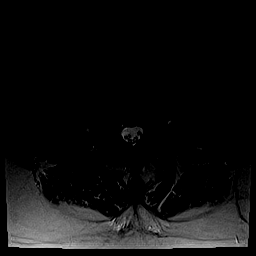
[im 11/38]
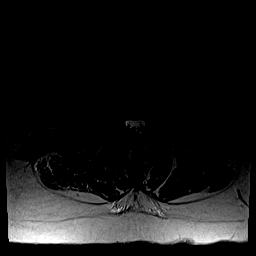
[im 16/38]
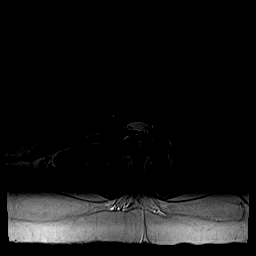
[im 19/38]
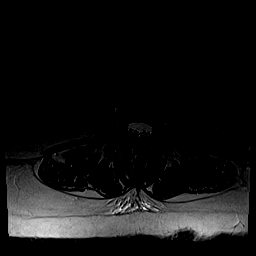
[im 22/38]
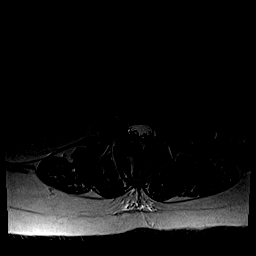
[im 27/38]
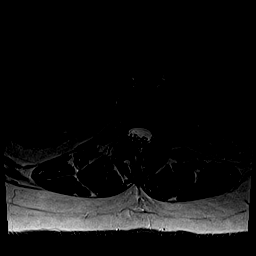
[im 32/38]
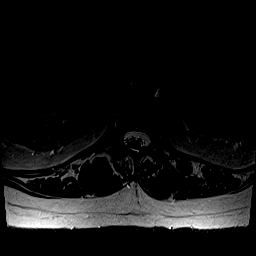
[im 38/38]
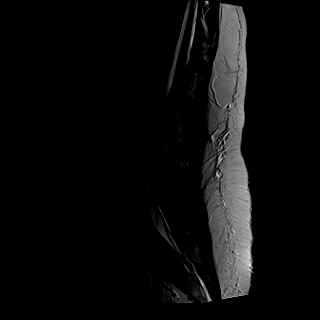

[24 of 48 positions shown; findings below may reference images not displayed]

FINDINGS: The vertebral bodies of the lumbar spine are normal in size and alignment.
There is normal bone marrow signal demonstrated throughout the vertebra.
There is mild disc desiccation at L4-L5 and L5-S1.

The spinal cord is of normal volume and contour. The cord terminates
normally at L1 . The nerve roots of the cauda equina and the filum terminale
have the usual appearance.

The visualized portions of the SI joints are unremarkable.

The imaged intra-abdominal contents are unremarkable.

T12-L1: No significant disc bulge. No evidence of neural foraminal or
central stenosis.

L1-L2: No significant disc bulge. No evidence of neural foraminal or central
stenosis.

L2-L3: No significant disc bulge. No evidence of neural foraminal or central
stenosis.

L3-L4: No significant disc bulge. No evidence of neural foraminal or central
stenosis.

L4-L5: Mild right paracentral disc bulge with an annular tear. Moderate left
and mild right facet arthropathy. No evidence of neural foraminal or central
stenosis.

L5-S1: No significant disc bulge. No evidence of neural foraminal or central
stenosis. Moderate bilateral facet arthropathy.
IMPRESSION: 1. Mild right paracentral disc bulge with an annular tear at L4-L5.

## 2011-11-11 ENCOUNTER — Inpatient Hospital Stay: Payer: Self-pay | Admitting: *Deleted

## 2011-11-11 LAB — COMPREHENSIVE METABOLIC PANEL
Alkaline Phosphatase: 200 U/L — ABNORMAL HIGH (ref 50–136)
Calcium, Total: 9.4 mg/dL (ref 8.5–10.1)
Co2: 16 mmol/L — ABNORMAL LOW (ref 21–32)
EGFR (African American): >60
EGFR (Non-African Amer.): >60
SGOT(AST): 180 U/L — ABNORMAL HIGH (ref 15–37)
SGPT (ALT): 358 U/L — ABNORMAL HIGH

## 2011-11-11 LAB — URINALYSIS, COMPLETE
Bacteria: NONE SEEN
Bilirubin,UR: NEGATIVE
Glucose,UR: 150 mg/dL (ref 0–75)
Ketone: NEGATIVE
Nitrite: NEGATIVE
Specific Gravity: 1.026 (ref 1.003–1.030)
Squamous Epithelial: 4
WBC UR: 11 /HPF (ref 0–5)

## 2011-11-11 LAB — CBC
MCV: 97 fL (ref 80–100)
Platelet: 146 10*3/uL — ABNORMAL LOW (ref 150–440)
RBC: 5.89 10*6/uL — ABNORMAL HIGH (ref 3.80–5.20)
WBC: 23.9 10*3/uL — ABNORMAL HIGH (ref 3.6–11.0)

## 2011-11-11 LAB — TSH: Thyroid Stimulating Horm: 2.19 u[IU]/mL

## 2011-11-11 LAB — POTASSIUM: Potassium: 4.1 mmol/L (ref 3.5–5.1)

## 2011-11-11 LAB — CK TOTAL AND CKMB (NOT AT ARMC)
CK, Total: 98 U/L (ref 21–215)
CK, Total: 52 U/L (ref 21–215)
CK, Total: 40 U/L (ref 21–215)
CK-MB: 3.3 ng/mL (ref 0.5–3.6)

## 2011-11-11 LAB — TROPONIN I
Troponin-I: 0.05 ng/mL
Troponin-I: 0.03 ng/mL

## 2011-11-11 LAB — ELECTROLYTE PANEL
Anion Gap: 19 — ABNORMAL HIGH (ref 7–16)
Chloride: 99 mmol/L (ref 98–107)
Co2: 22 mmol/L (ref 21–32)
Sodium: 140 mmol/L (ref 136–145)

## 2011-11-11 LAB — LIPASE, BLOOD: Lipase: 59 U/L — ABNORMAL LOW (ref 73–393)

## 2011-11-12 LAB — COMPREHENSIVE METABOLIC PANEL
Anion Gap: 12 (ref 7–16)
BUN: 27 mg/dL — ABNORMAL HIGH (ref 7–18)
Bilirubin,Total: 0.9 mg/dL (ref 0.2–1.0)
Chloride: 107 mmol/L (ref 98–107)
Co2: 23 mmol/L (ref 21–32)
Creatinine: 0.53 mg/dL — ABNORMAL LOW (ref 0.60–1.30)
EGFR (African American): >60
EGFR (Non-African Amer.): >60
Potassium: 3.1 mmol/L — ABNORMAL LOW (ref 3.5–5.1)
SGPT (ALT): 264 U/L — ABNORMAL HIGH
Total Protein: 5.3 g/dL — ABNORMAL LOW (ref 6.4–8.2)

## 2011-11-12 LAB — PROTIME-INR
INR: 1.4
Prothrombin Time: 17.7 secs — ABNORMAL HIGH (ref 11.5–14.7)

## 2011-11-12 LAB — LIPID PANEL
Cholesterol: 182 mg/dL (ref 0–200)
Triglycerides: 149 mg/dL (ref 0–200)

## 2011-11-12 LAB — CBC WITH DIFFERENTIAL/PLATELET
Basophil #: 0 10*3/uL (ref 0.0–0.1)
Basophil %: 0.1 %
Eosinophil %: 0 %
HCT: 44.8 % (ref 35.0–47.0)
Lymphocyte #: 1.7 10*3/uL (ref 1.0–3.6)
MCH: 32.5 pg (ref 26.0–34.0)
MCV: 97 fL (ref 80–100)
Monocyte %: 4.8 %
Neutrophil #: 10.4 10*3/uL — ABNORMAL HIGH (ref 1.4–6.5)
Neutrophil %: 82 %
Platelet: 102 10*3/uL — ABNORMAL LOW (ref 150–440)
RBC: 4.61 10*6/uL (ref 3.80–5.20)
WBC: 12.7 10*3/uL — ABNORMAL HIGH (ref 3.6–11.0)

## 2011-11-12 LAB — MAGNESIUM: Magnesium: 1.6 mg/dL — ABNORMAL LOW

## 2011-11-12 LAB — HEMOGLOBIN A1C: Hemoglobin A1C: 14.7 % — ABNORMAL HIGH (ref 4.2–6.3)

## 2011-11-13 LAB — URINALYSIS, COMPLETE
Bacteria: NONE SEEN
Bilirubin,UR: NEGATIVE
Glucose,UR: 50 mg/dL (ref 0–75)
Leukocyte Esterase: NEGATIVE
Nitrite: NEGATIVE
RBC,UR: 5 /HPF (ref 0–5)
Squamous Epithelial: <1

## 2011-11-13 LAB — HEPATIC FUNCTION PANEL A (ARMC)
Albumin: 2.3 g/dL — ABNORMAL LOW (ref 3.4–5.0)
Bilirubin, Direct: 0.3 mg/dL — ABNORMAL HIGH (ref 0.00–0.20)
Bilirubin,Total: 0.8 mg/dL (ref 0.2–1.0)
Total Protein: 4.6 g/dL — ABNORMAL LOW (ref 6.4–8.2)

## 2011-11-13 LAB — CBC WITH DIFFERENTIAL/PLATELET
Basophil %: 0.2 %
Eosinophil #: 0 10*3/uL (ref 0.0–0.7)
HCT: 38.2 % (ref 35.0–47.0)
HGB: 12.8 g/dL (ref 12.0–16.0)
Lymphocyte #: 1.2 10*3/uL (ref 1.0–3.6)
Lymphocyte %: 14.8 %
MCHC: 33.4 g/dL (ref 32.0–36.0)
MCV: 97 fL (ref 80–100)
Monocyte %: 5.5 %
Neutrophil #: 6.3 10*3/uL (ref 1.4–6.5)
RDW: 14.5 % (ref 11.5–14.5)
WBC: 7.9 10*3/uL (ref 3.6–11.0)

## 2011-11-13 LAB — IRON AND TIBC
Iron Bind.Cap.(Total): 187 ug/dL — ABNORMAL LOW (ref 250–450)
Iron: 134 ug/dL (ref 50–170)

## 2011-11-13 LAB — BASIC METABOLIC PANEL
Anion Gap: 10 (ref 7–16)
Chloride: 109 mmol/L — ABNORMAL HIGH (ref 98–107)
Creatinine: 0.44 mg/dL — ABNORMAL LOW (ref 0.60–1.30)
EGFR (African American): >60
Osmolality: 289 (ref 275–301)
Potassium: 4.3 mmol/L (ref 3.5–5.1)

## 2011-11-14 LAB — CBC WITH DIFFERENTIAL/PLATELET
Basophil %: 0.2 %
Eosinophil #: 0 10*3/uL (ref 0.0–0.7)
HCT: 36.9 % (ref 35.0–47.0)
HGB: 12.5 g/dL (ref 12.0–16.0)
Lymphocyte #: 1.2 10*3/uL (ref 1.0–3.6)
Lymphocyte %: 15.3 %
MCH: 32.8 pg (ref 26.0–34.0)
MCHC: 33.9 g/dL (ref 32.0–36.0)
Monocyte %: 5.9 %
Neutrophil #: 6.2 10*3/uL (ref 1.4–6.5)
RDW: 14.4 % (ref 11.5–14.5)
WBC: 7.9 10*3/uL (ref 3.6–11.0)

## 2011-11-14 LAB — BASIC METABOLIC PANEL
Anion Gap: 9 (ref 7–16)
BUN: 8 mg/dL (ref 7–18)
Chloride: 105 mmol/L (ref 98–107)
Co2: 26 mmol/L (ref 21–32)
Creatinine: 0.54 mg/dL — ABNORMAL LOW (ref 0.60–1.30)
Potassium: 4.5 mmol/L (ref 3.5–5.1)
Sodium: 140 mmol/L (ref 136–145)

## 2011-11-14 LAB — URINE CULTURE

## 2011-11-14 LAB — AFP TUMOR MARKER: AFP-Tumor Marker: 2.6 ng/mL (ref 0.0–8.3)

## 2011-11-15 LAB — HEPATIC FUNCTION PANEL A (ARMC)
Albumin: 2.3 g/dL — ABNORMAL LOW (ref 3.4–5.0)
Alkaline Phosphatase: 125 U/L (ref 50–136)
Bilirubin, Direct: 0.1 mg/dL (ref 0.00–0.20)
Bilirubin,Total: 0.4 mg/dL (ref 0.2–1.0)
SGOT(AST): 36 U/L (ref 15–37)
SGPT (ALT): 143 U/L — ABNORMAL HIGH

## 2011-11-15 LAB — PLATELET COUNT: Platelet: 88 10*3/uL — ABNORMAL LOW (ref 150–440)

## 2011-11-16 LAB — BASIC METABOLIC PANEL
Calcium, Total: 7.9 mg/dL — ABNORMAL LOW (ref 8.5–10.1)
Chloride: 105 mmol/L (ref 98–107)
EGFR (Non-African Amer.): >60
Glucose: 74 mg/dL (ref 65–99)
Osmolality: 282 (ref 275–301)
Potassium: 3.8 mmol/L (ref 3.5–5.1)

## 2012-01-01 IMAGING — CR DG ABDOMEN 3V
1 series · 5 of 5 positions shown · non-contrast
Comparison: none

REASON FOR EXAM: vomiting
COMMENTS:

[Series 1: view not recorded · 0.17mm/px · 5 of 5 slices shown]
[im 1/5]
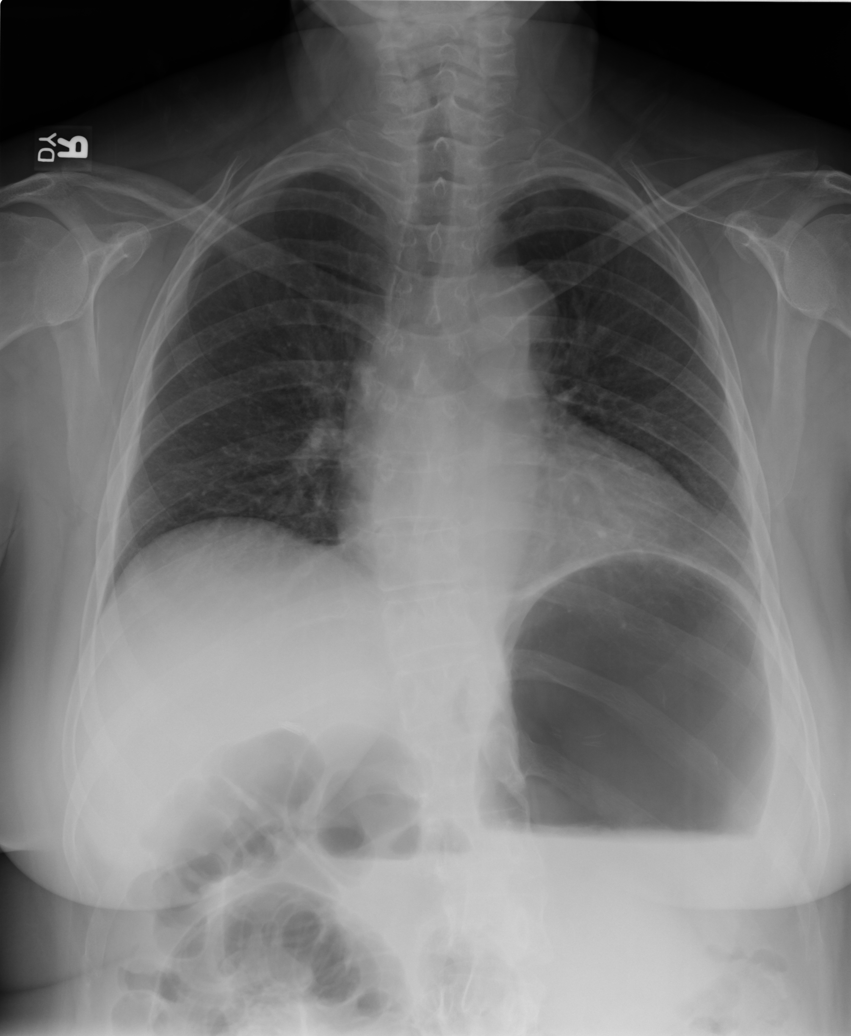
[im 2/5]
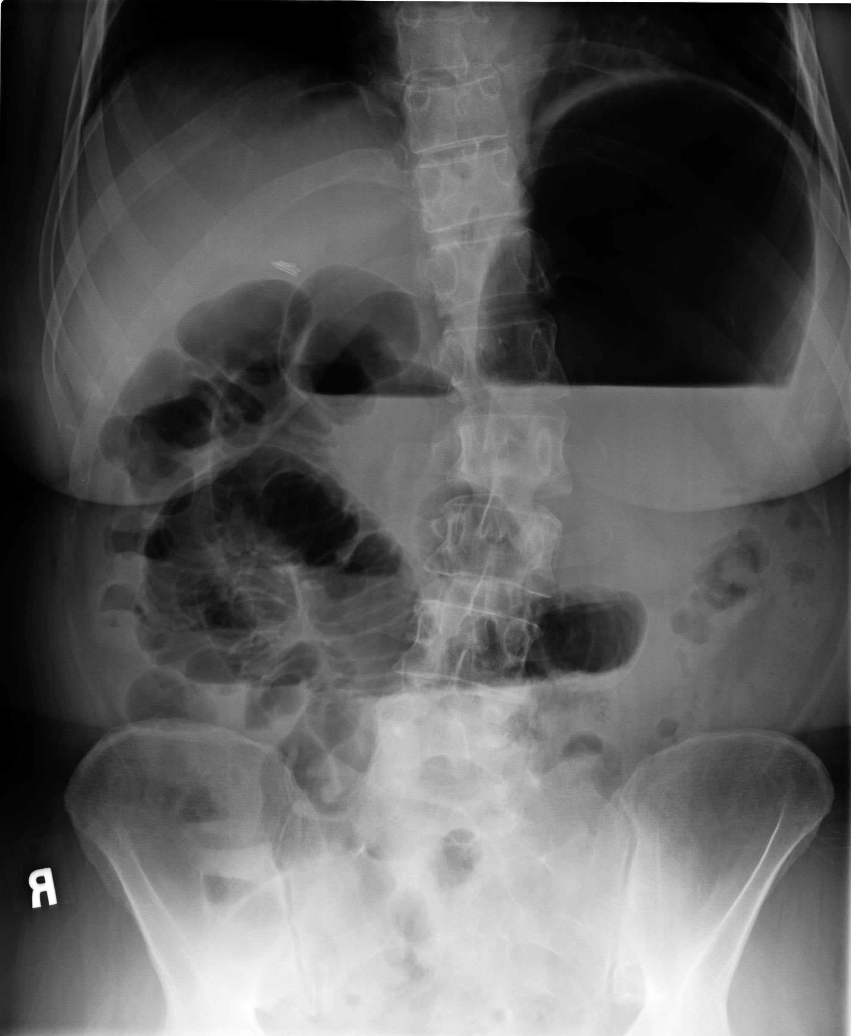
[im 3/5]
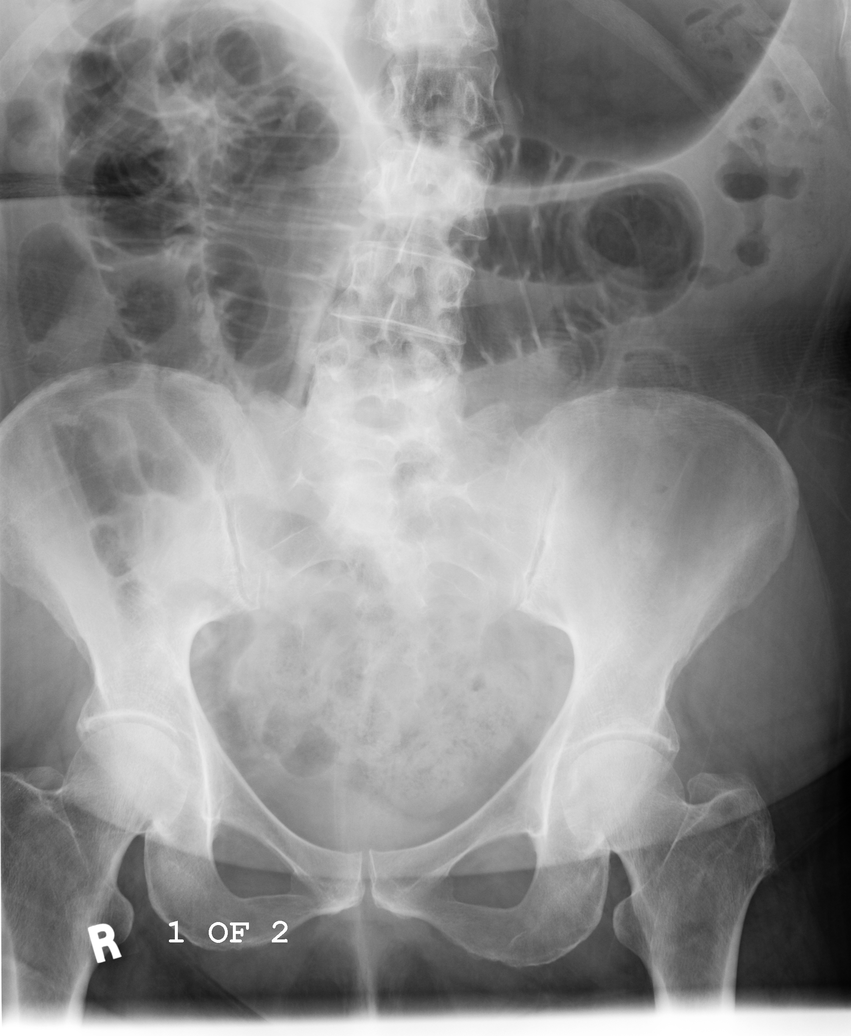
[im 4/5]
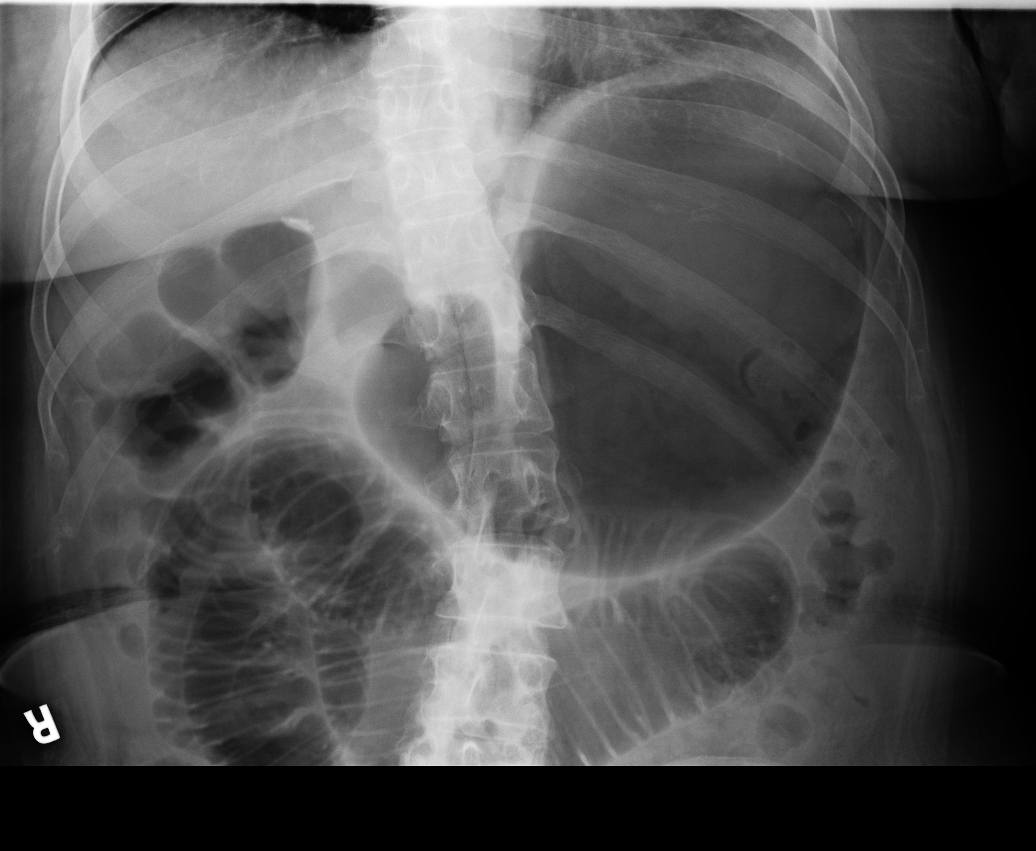
[im 5/5]
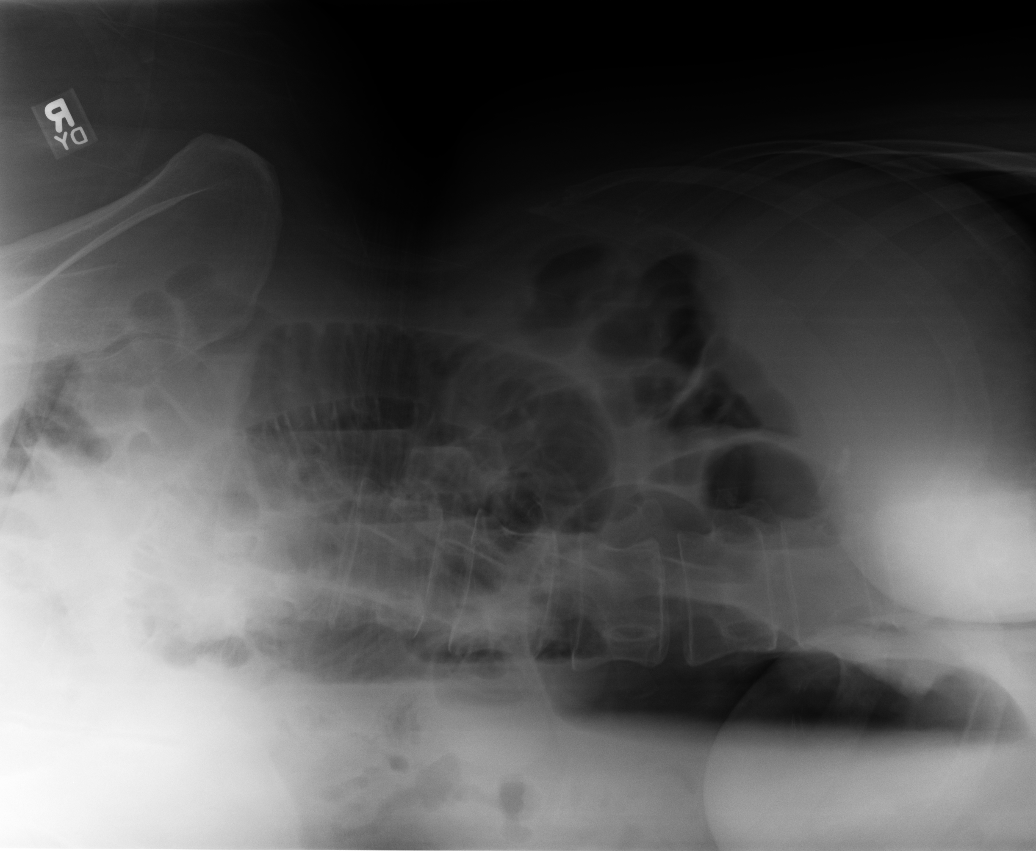

[5 of 5 positions shown; findings below may reference images not displayed]

PROCEDURE:     DXR - DXR ABDOMEN 3-WAY (INCL PA CXR)  - February 07, 2010  [DATE]

RESULT:     Comparison is made to a prior study dated 09-21-2009.

Air is seen within a distended stomach as well as air-filled loops of large
bowel and dilated loops of small bowel. There is a paucity of distal bowel
gas. Air-fluid levels are also appreciated within loops of small bowel.
Stool is identified in the region of the rectosigmoid colon. The visualized
bony skeleton demonstrates levoscoliosis.
IMPRESSION: Early or partial small bowel obstruction versus an ileus.
Surveillance evaluation is recommended.

## 2012-01-02 IMAGING — CR DG ABDOMEN 1V
1 series · 2 of 2 positions shown · non-contrast
Comparison: none

REASON FOR EXAM: ileus
COMMENTS:

[Series 1: view not recorded · 0.17mm/px · 2 of 2 slices shown]
[im 1/2]
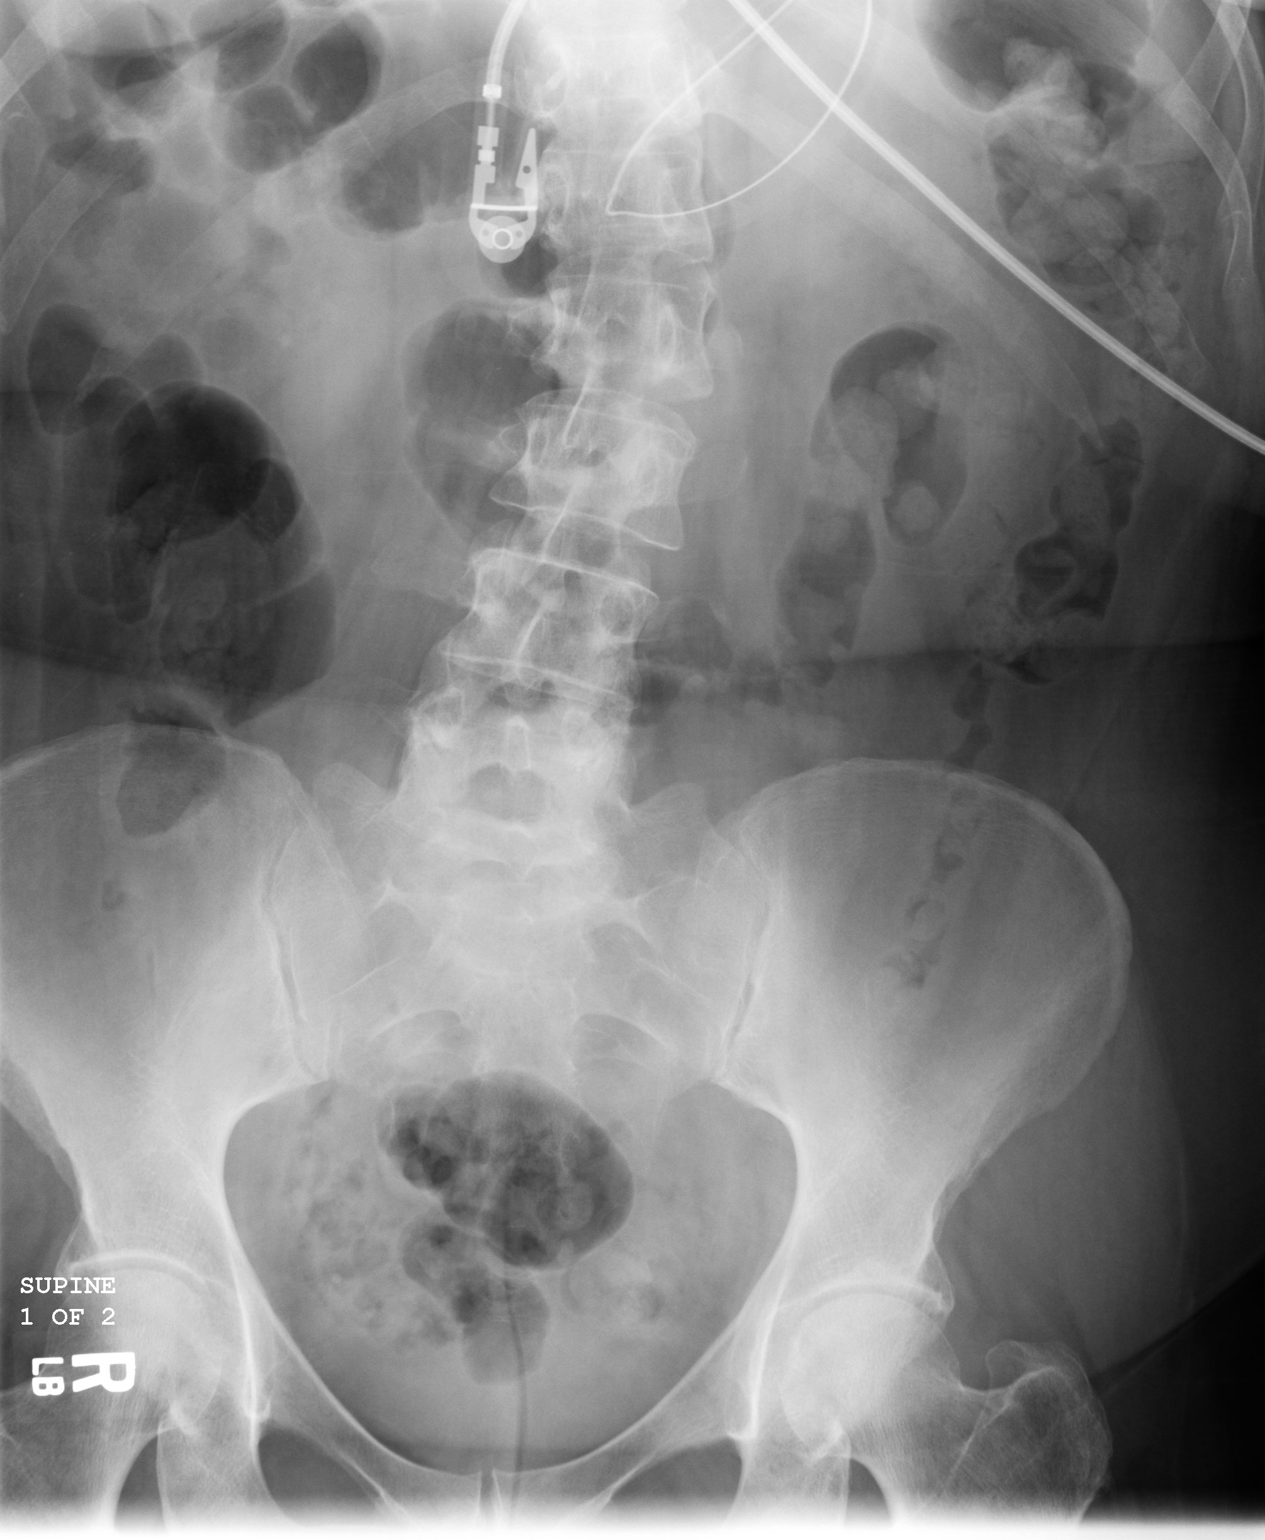
[im 2/2]
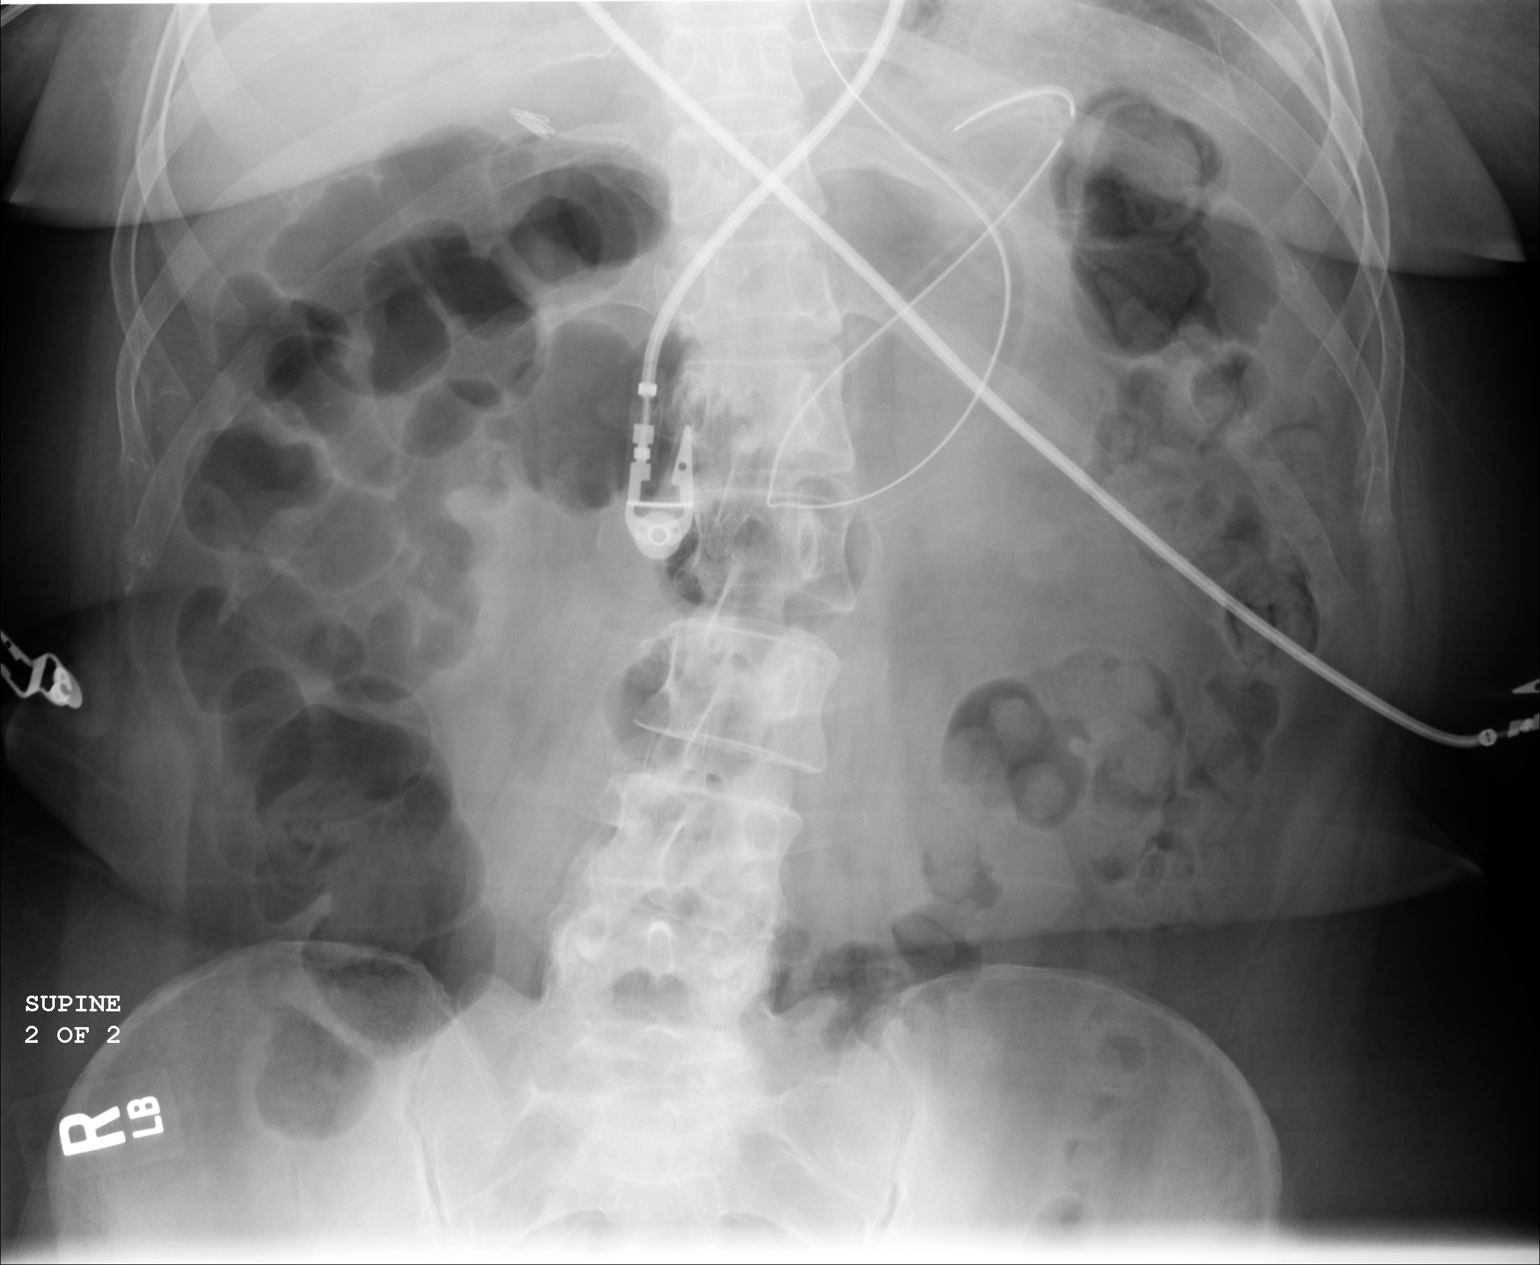

[2 of 2 positions shown; findings below may reference images not displayed]

PROCEDURE:     DXR - DXR KIDNEY URETER BLADDER  - February 08, 2010 [DATE]

RESULT:     Comparison is made to the prior exam of 11/20/2007. The current
exam shows gas in large and small bowel. No significantly dilated loops of
bowel are seen. No bowel obstruction is evident. A nasogastric tube is
present with the distal portion coiled in the stomach. There is a 2 to 3-mm
density projected over the lower pole of the right kidney that may represent
a right renal stone. A moderate lumbar scoliosis is again seen.
IMPRESSION: 1. No bowel obstruction is seen.
2. Possible right nephrolithiasis.
3. A moderate thoracolumbar scoliosis is again observed with the convexity
being to the left.
4. Nasogastric tube is present.

## 2012-01-04 ENCOUNTER — Ambulatory Visit: Payer: Medicare Other | Admitting: Endocrinology

## 2012-01-14 ENCOUNTER — Other Ambulatory Visit: Payer: Self-pay | Admitting: Family Medicine

## 2012-01-14 ENCOUNTER — Ambulatory Visit
Admission: RE | Admit: 2012-01-14 | Discharge: 2012-01-14 | Disposition: A | Discharge status: Home / Self Care | Payer: Medicare Other | Source: Ambulatory Visit | Attending: Family Medicine | Admitting: Family Medicine

## 2012-01-14 DIAGNOSIS — R0602 Shortness of breath: Secondary | ICD-10-CM

## 2012-01-14 DIAGNOSIS — R05 Cough: Secondary | ICD-10-CM

## 2012-01-24 ENCOUNTER — Ambulatory Visit: Payer: Medicare Other | Admitting: Endocrinology

## 2012-01-27 LAB — COMPREHENSIVE METABOLIC PANEL
Alkaline Phosphatase: 124 U/L (ref 50–136)
Bilirubin,Total: 0.9 mg/dL (ref 0.2–1.0)
Calcium, Total: 8.7 mg/dL (ref 8.5–10.1)
Chloride: 101 mmol/L (ref 98–107)
Creatinine: 0.67 mg/dL (ref 0.60–1.30)
SGOT(AST): 55 U/L — ABNORMAL HIGH (ref 15–37)
Sodium: 139 mmol/L (ref 136–145)
Total Protein: 7.1 g/dL (ref 6.4–8.2)

## 2012-01-27 LAB — CBC
HGB: 15 g/dL (ref 12.0–16.0)
MCV: 99 fL (ref 80–100)
WBC: 6.2 10*3/uL (ref 3.6–11.0)

## 2012-01-27 LAB — LIPASE, BLOOD: Lipase: 251 U/L (ref 73–393)

## 2012-01-28 ENCOUNTER — Inpatient Hospital Stay: Payer: Self-pay | Admitting: Internal Medicine

## 2012-01-28 ENCOUNTER — Ambulatory Visit: Payer: Medicare Other | Admitting: Endocrinology

## 2012-01-28 LAB — URINALYSIS, COMPLETE
Glucose,UR: >=500 mg/dL (ref 0–75)
Ph: 6 (ref 4.5–8.0)
Protein: NEGATIVE
Specific Gravity: 1.029 (ref 1.003–1.030)
Squamous Epithelial: 2
WBC UR: 2 /HPF (ref 0–5)

## 2012-01-28 LAB — TROPONIN I
Troponin-I: <0.02 ng/mL
Troponin-I: <0.02 ng/mL
Troponin-I: <0.02 ng/mL

## 2012-01-29 LAB — AFP TUMOR MARKER: AFP-Tumor Marker: 3.9 ng/mL (ref 0.0–8.3)

## 2012-01-30 IMAGING — CT CT HEAD WITHOUT CONTRAST
2 series · 16 of 30 positions shown, 20 images · non-contrast
Comparison: none

REASON FOR EXAM: ams
COMMENTS:

PROCEDURE:     CT  - CT HEAD WITHOUT CONTRAST  - March 08, 2010  [DATE]
RESULT:     History: Weakness.
Comparison Study: CT of 07/26/2009.

[Series 3: without · axial · non-contrast · 0.38mm/px · z∈[+388,+508]mm · 13 of 28 slices shown, 17 images]
[im 2/28  brain]
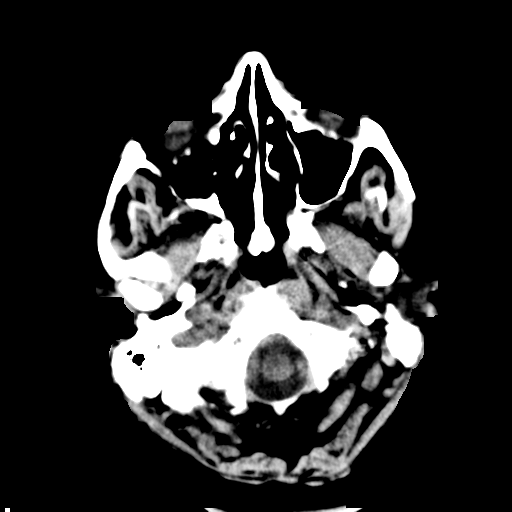
[im 2/28  bone]
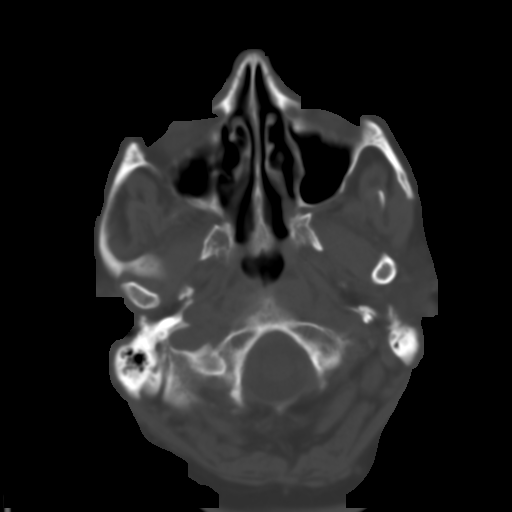
[im 4/28  brain]
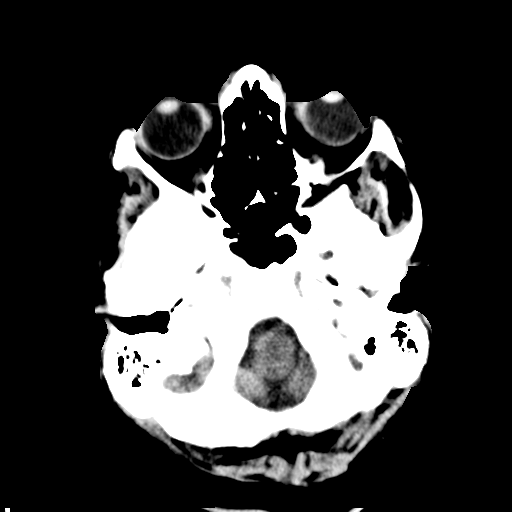
[im 6/28  brain]
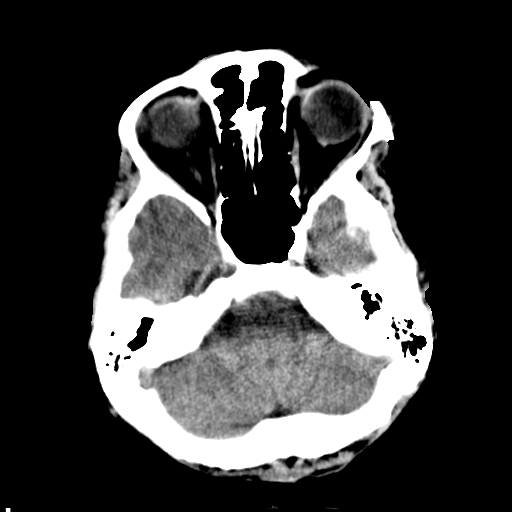
[im 8/28  brain]
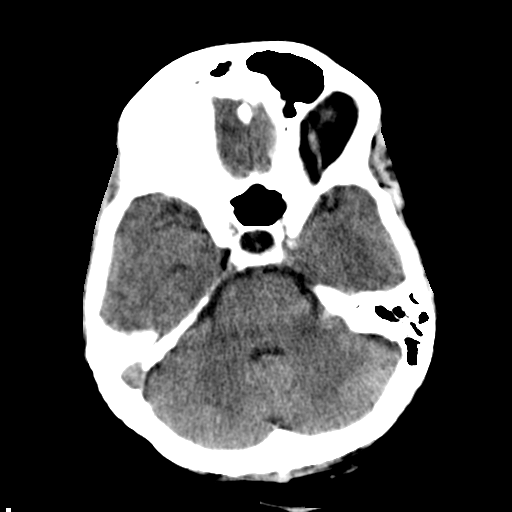
[im 10/28  brain]
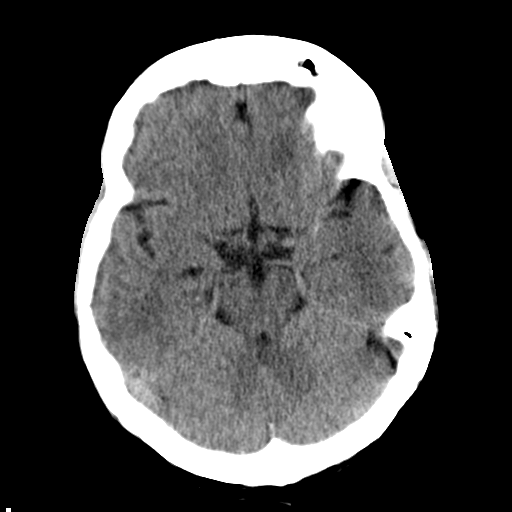
[im 10/28  bone]
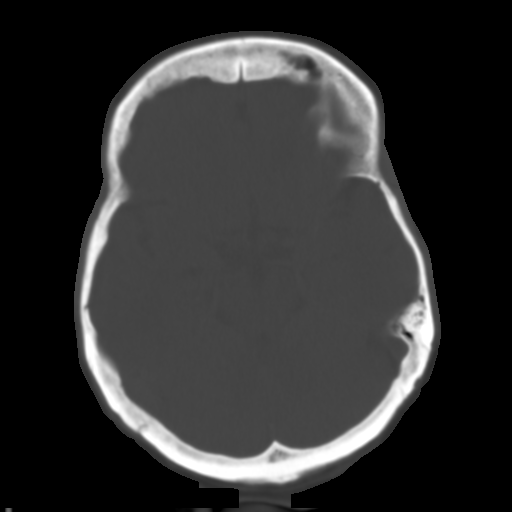
[im 12/28  brain]
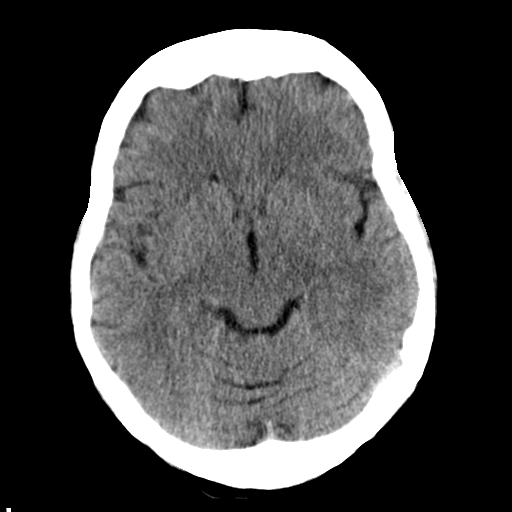
[im 14/28  brain]
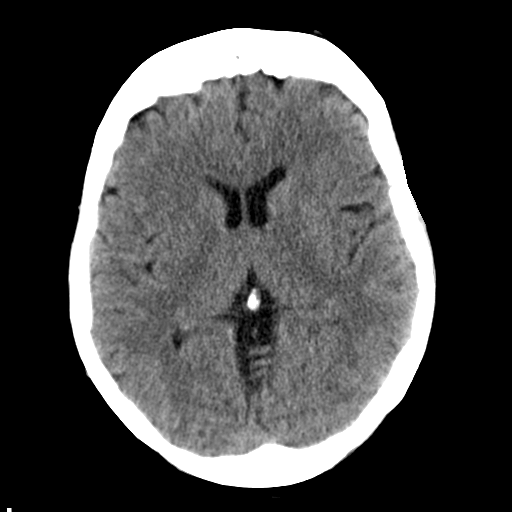
[im 16/28  brain]
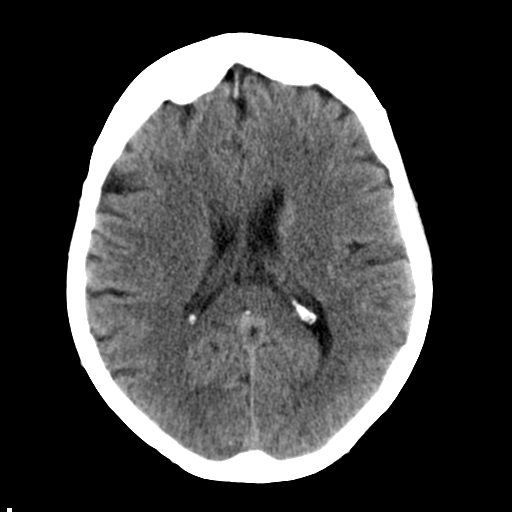
[im 18/28  brain]
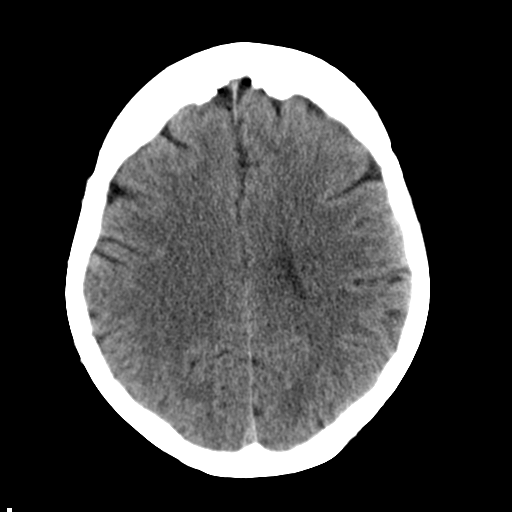
[im 18/28  bone]
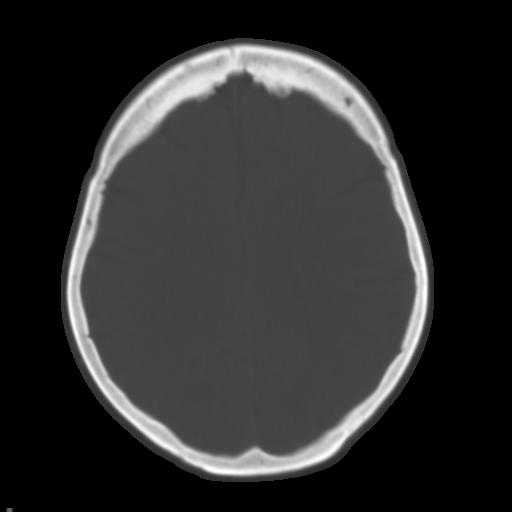
[im 20/28  brain]
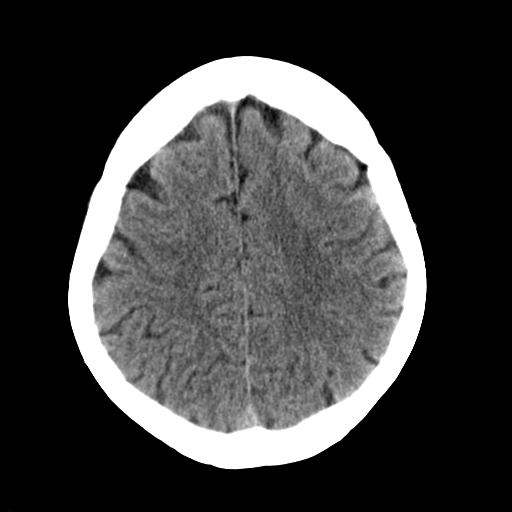
[im 22/28  brain]
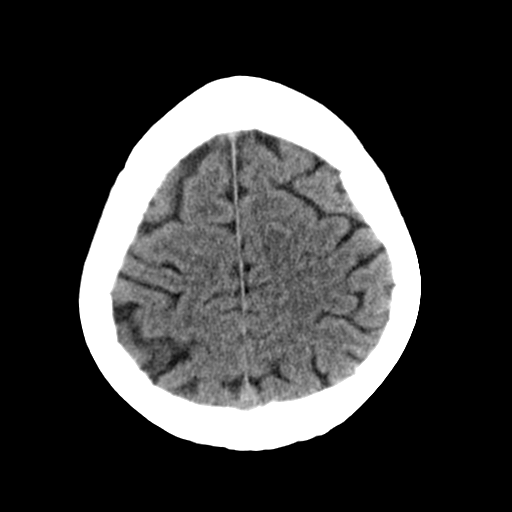
[im 24/28  brain]
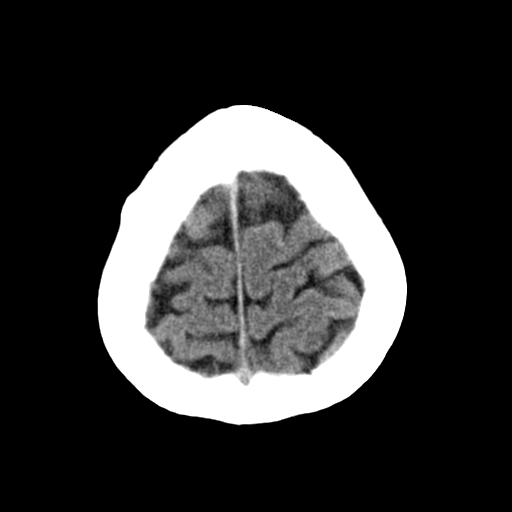
[im 26/28  brain]
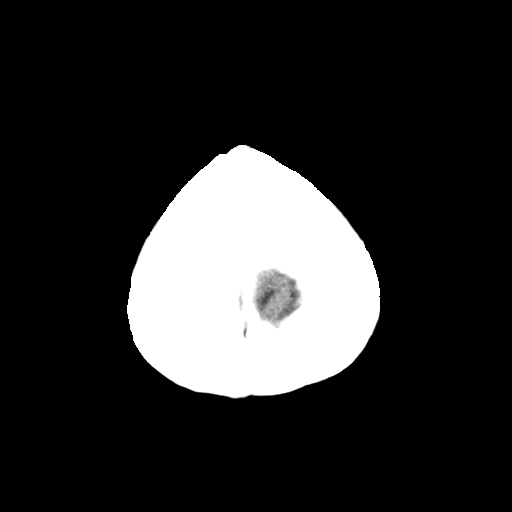
[im 26/28  bone]
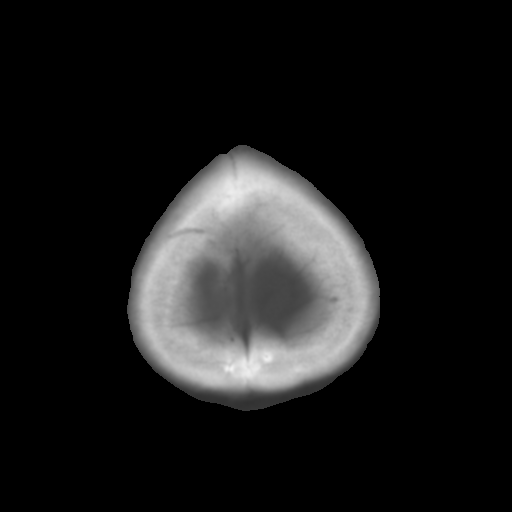

[Series 4: bone · axial · 0.37mm/px · z∈[+388,+428]mm · 3 of 28 slices shown]
[im 2/28  bone]
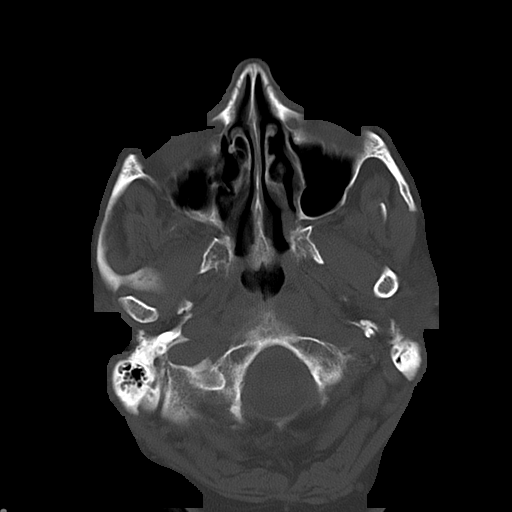
[im 6/28  bone]
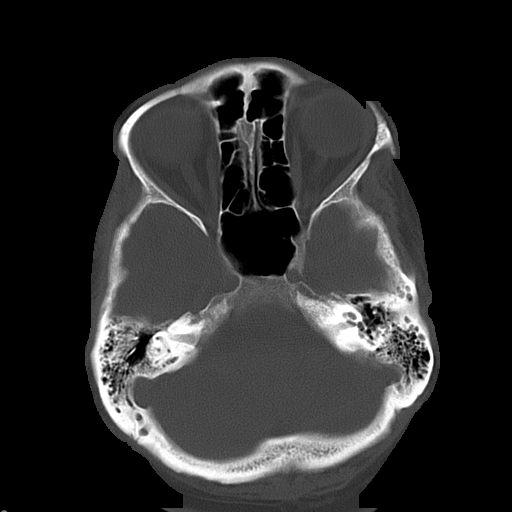
[im 10/28  bone]
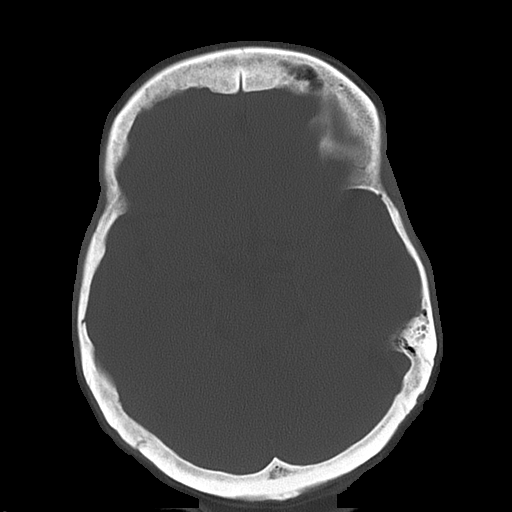

[16 of 30 positions shown; findings below may reference images not displayed]

FINDINGS: No mass lesion. No hemorrhage. No hydrocephalus. No acute bony
abnormality.
IMPRESSION: No acute abnormality. Stable exam from 07/26/2009.

## 2012-01-30 IMAGING — CR DG CHEST 1V PORT
1 series · 1 of 1 positions shown · non-contrast
Comparison: none

REASON FOR EXAM: ams
COMMENTS:

PROCEDURE:     DXR - DXR PORTABLE CHEST SINGLE VIEW  - March 08, 2010  [DATE]
RESULT:     The lung fields are clear. The heart, mediastinal and osseous
structures are normal in appearance and show no significant interval change
as compared to a prior exam of 02/15/2010.

[view not recorded]
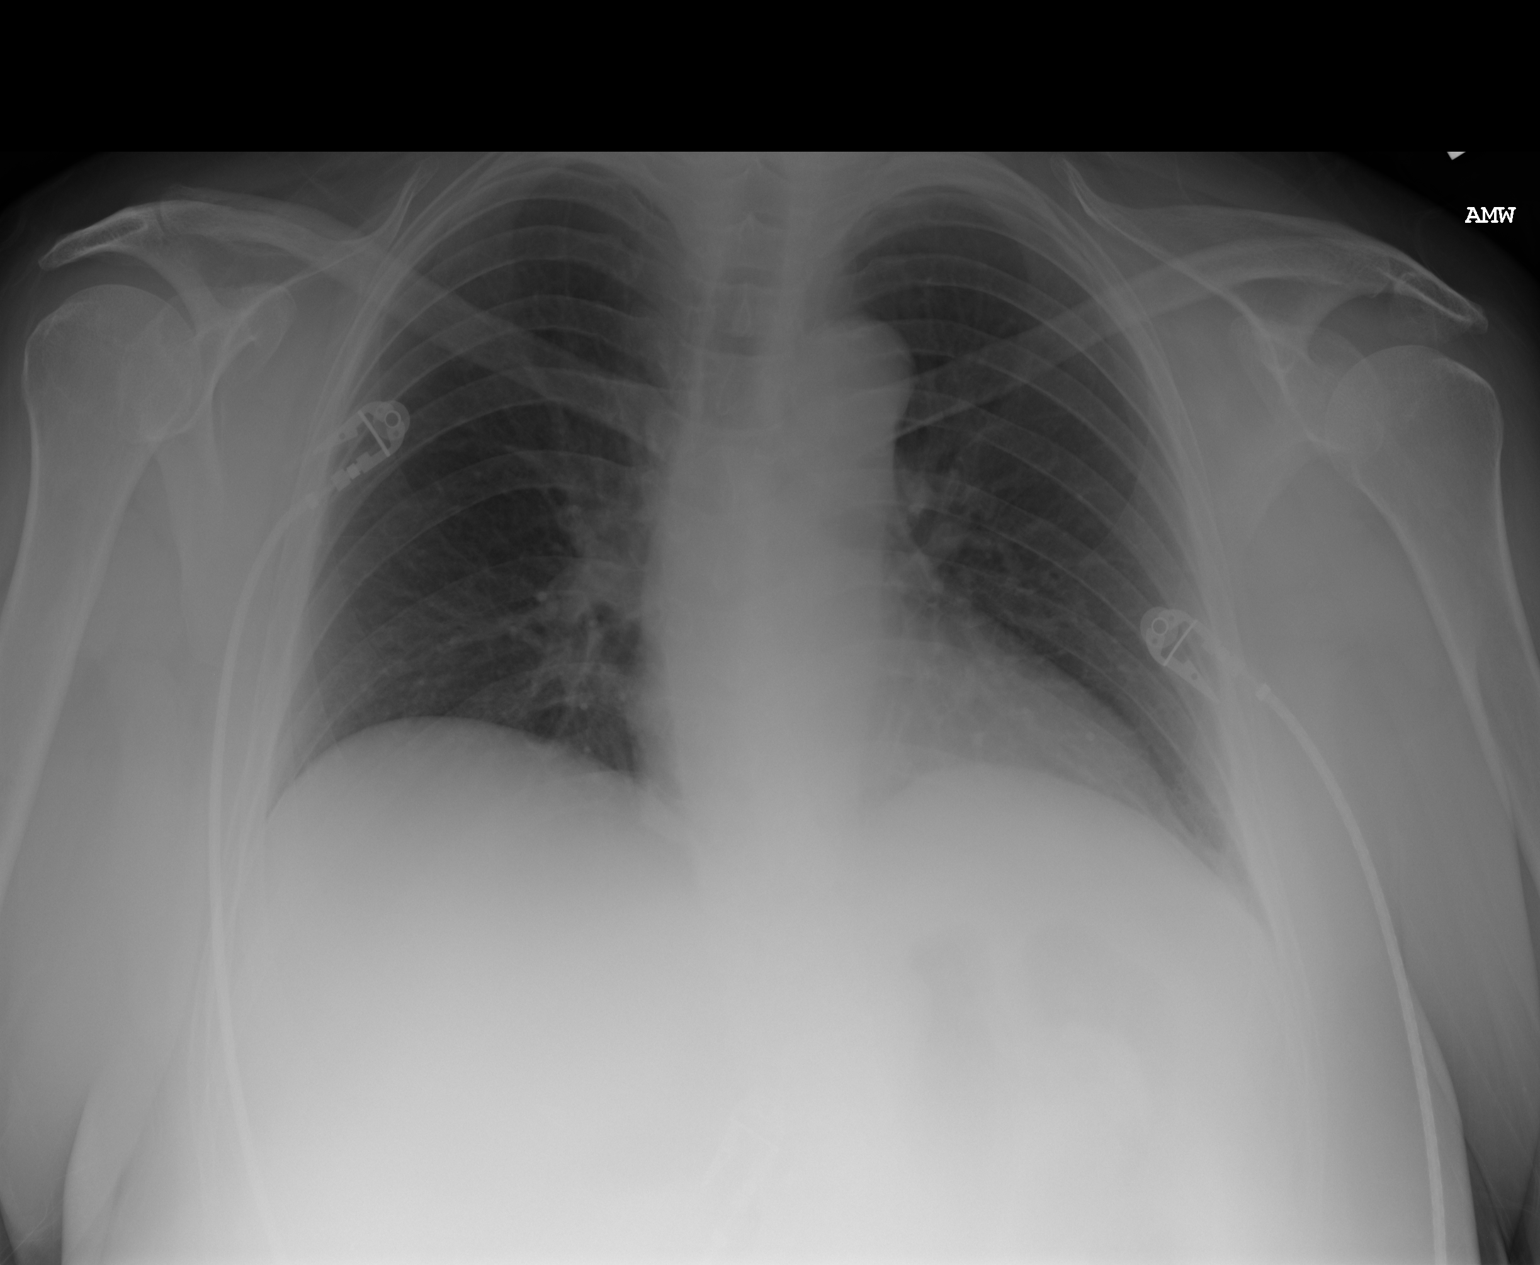

[1 of 1 positions shown; findings below may reference images not displayed]

IMPRESSION: 1. No significant abnormalities are noted.
2. Monitoring electrodes are present.

## 2012-03-04 IMAGING — CT CT ABD-PELV W/ CM
1 of 2 series · 15 of 32 positions shown, 19 images · non-contrast
Comparison: none

REASON FOR EXAM: Abd Pain Generalized  Diabetic Not Metformin
COMMENTS:

[Series 2: abd with 5.0 i40f · axial · 0.87mm/px · z∈[+88,+548]mm · 15 of 101 slices shown, 19 images]
[im 5/101  soft-tissue]
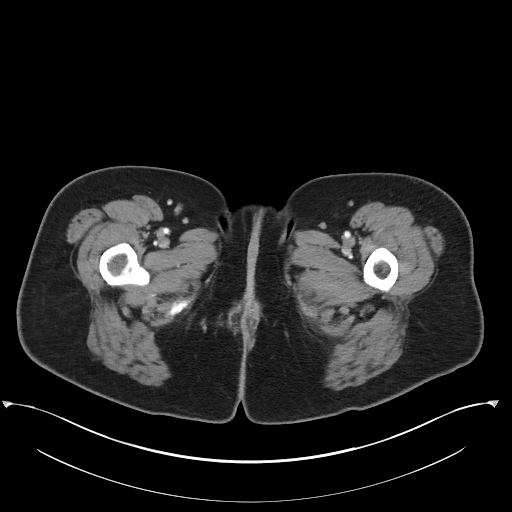
[im 5/101  bone]
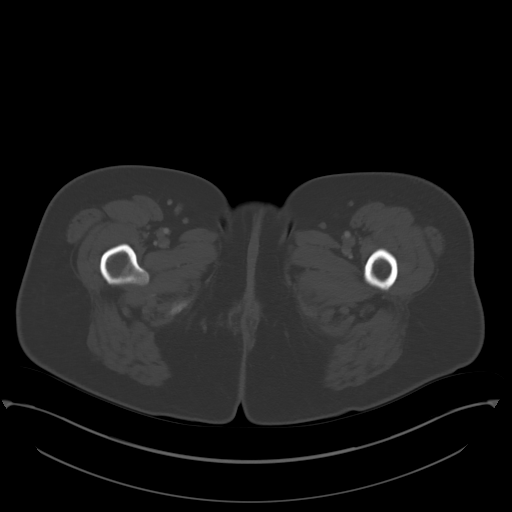
[im 13/101  soft-tissue]
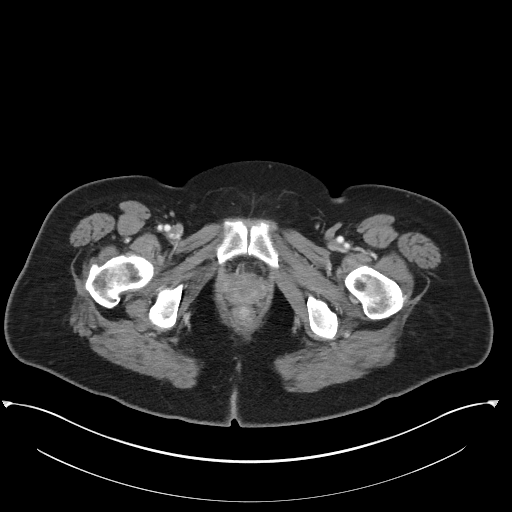
[im 21/101  soft-tissue]
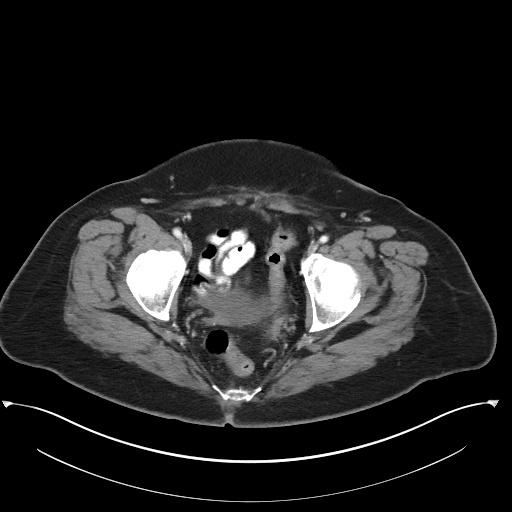
[im 29/101  soft-tissue]
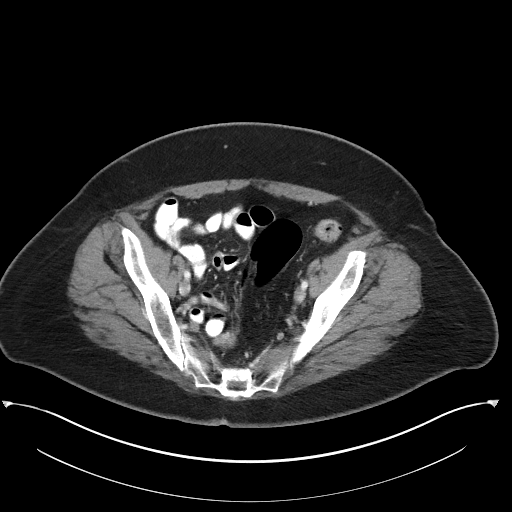
[im 37/101  soft-tissue]
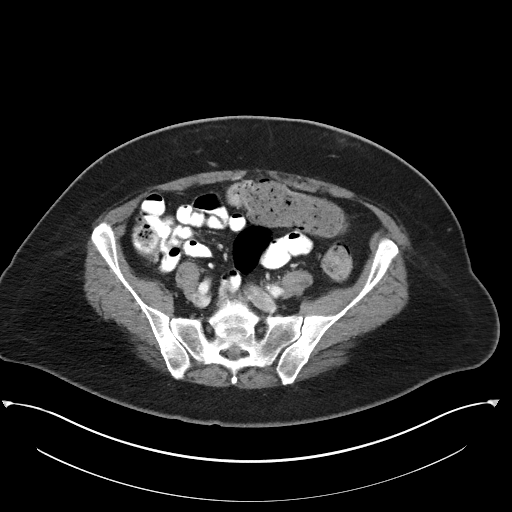
[im 45/101  soft-tissue]
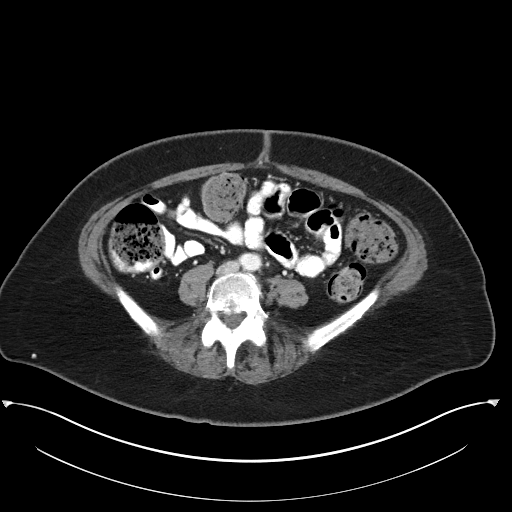
[im 53/101  soft-tissue]
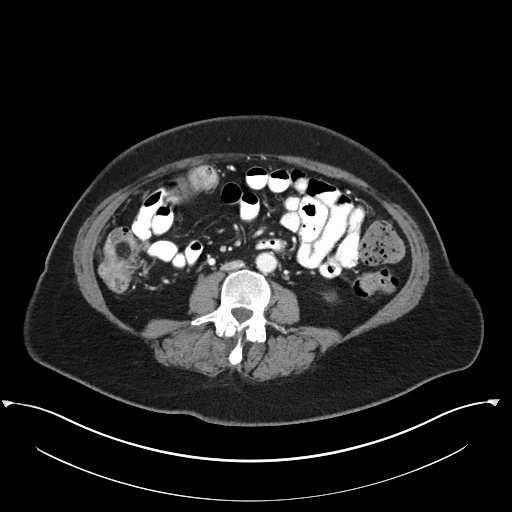
[im 57/101  soft-tissue]
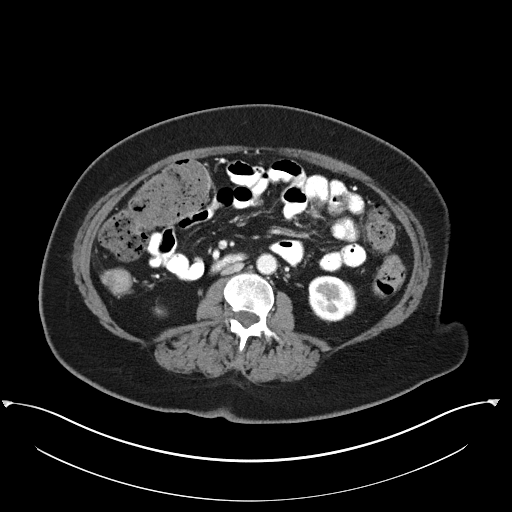
[im 65/101  soft-tissue]
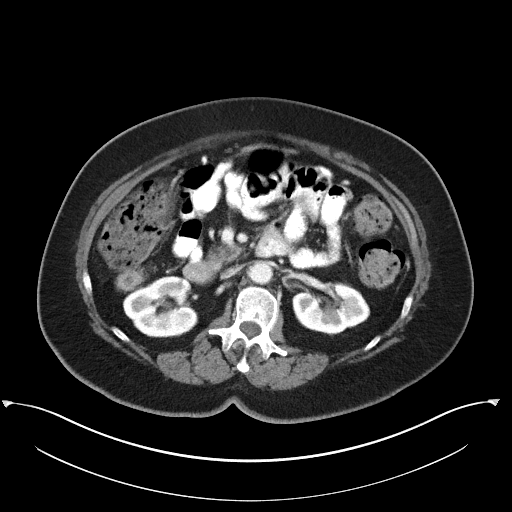
[im 65/101  bone]
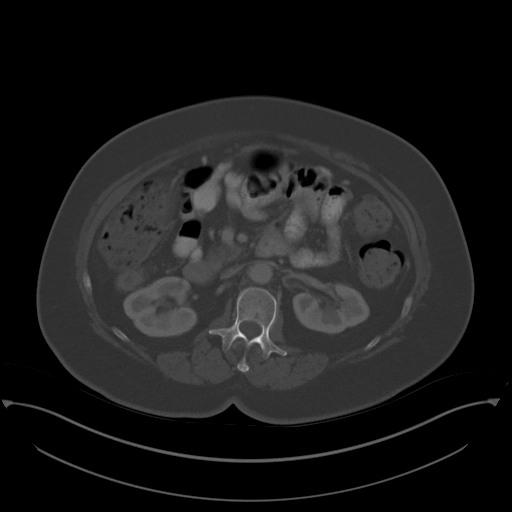
[im 73/101  soft-tissue]
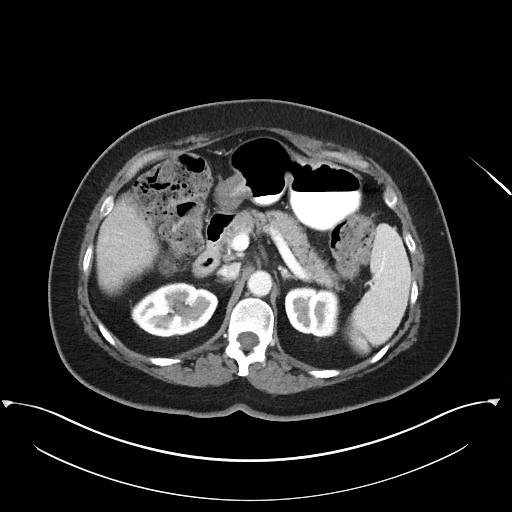
[im 81/101  soft-tissue]
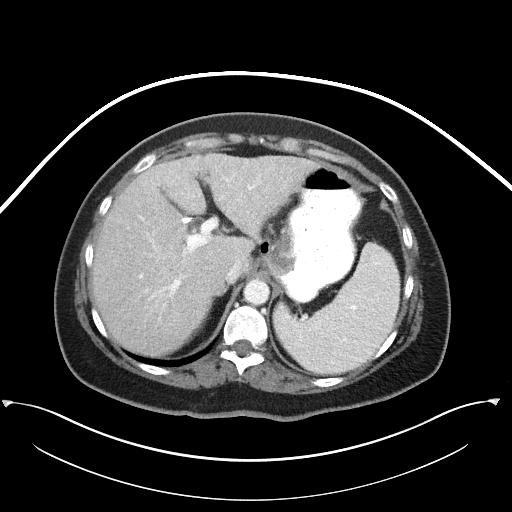
[im 85/101  lung]
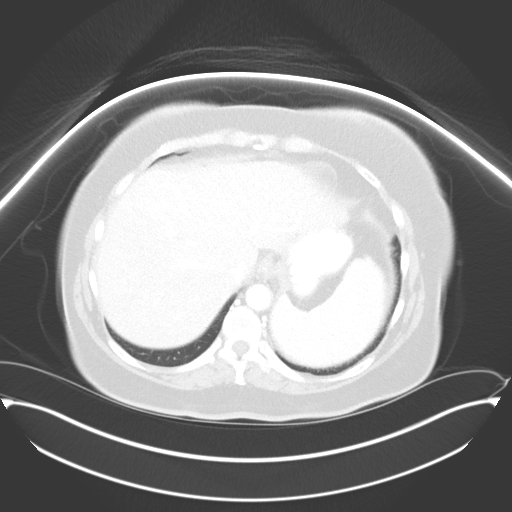
[im 89/101  soft-tissue]
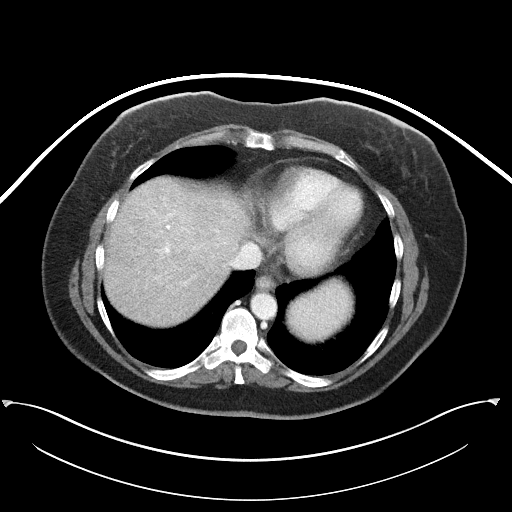
[im 89/101  lung]
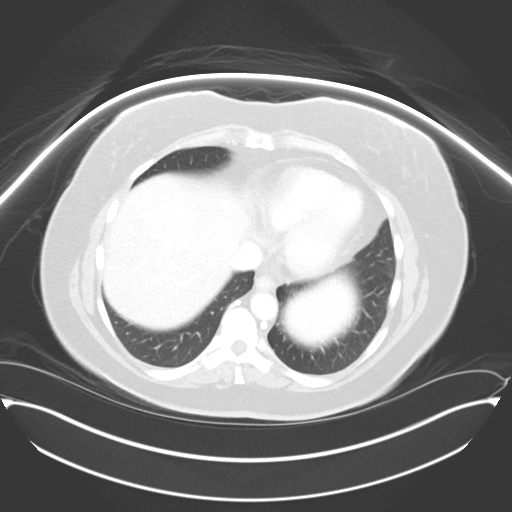
[im 93/101  lung]
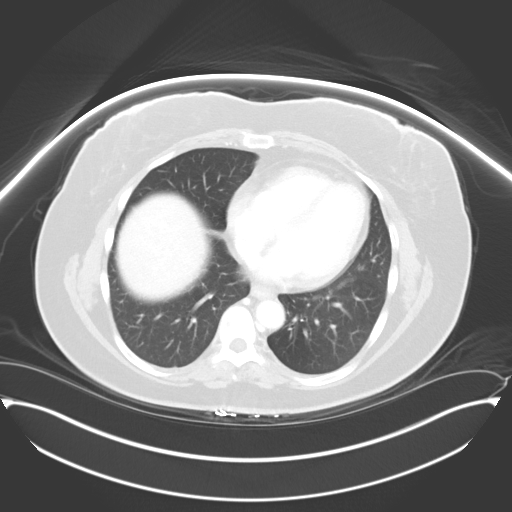
[im 97/101  soft-tissue]
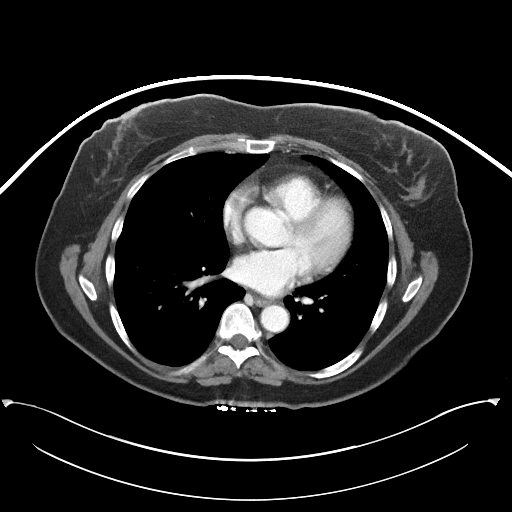
[im 97/101  lung]
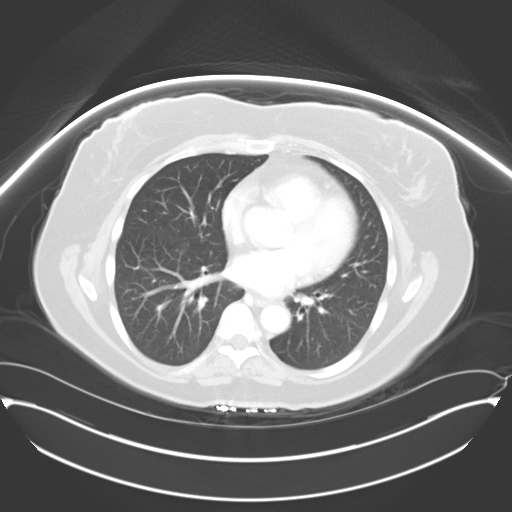

[15 of 32 positions shown; findings below may reference images not displayed]

PROCEDURE:     KCT - KCT ABDOMEN/PELVIS W  - April 11, 2010  [DATE]

RESULT:     Axial CT scanning was performed through the abdomen and pelvis
at 5 mm intervals and slice thicknesses following intravenous ministration
of 100 cc of Isovue-DMH as well as administration of oral contrast material.
Review of multiplanar reconstructed images was performed separately on the
VIA monitor. Comparison is made to the study 18 September, 2009.

The stomach is normally distended with contrast and air. The partially
contrast-filled loops of small bowel are normal in caliber. The contrast has
just reached the right colon. There is a moderate amount of stool in the
ascending, transverse, and descending portions of the colon. The
rectosigmoid colon is relatively collapsed.

The liver, spleen, pancreas, adrenal glands, and kidneys exhibit no acute
abnormality. There is a stable 3 mm diameter nonobstructing stone in the
lower pole of the right kidney. The caliber of the abdominal aorta is
normal. The periaortic and pericaval regions are normal in appearance. The
lumbar vertebral bodies are preserved in height. There is curvature of the
thoracolumbar spine and S-shaped configuration. The lung bases are clear.
IMPRESSION: 1. The marked gaseous distention seen previously has resolved. The stomach
is normally distended today. There is considerable stool within the colon
consistent with constipation.
2. I do not see evidence of an intra-abdominal mass or inflammatory process.
There is no evidence of lymphadenopathy.
3. There is a 3 mm dia nonobstructing lower pole stone in the right kidney.

## 2012-03-07 IMAGING — CR DG ABDOMEN 3V
1 series · 4 of 4 positions shown · non-contrast
Comparison: none

REASON FOR EXAM: nausea and vomiting.
COMMENTS:

PROCEDURE:     DXR - DXR ABDOMEN 3-WAY (INCL PA CXR)  - April 14, 2010  [DATE]
RESULT:     Comparisons:  None

[Series 1: view not recorded · 0.17mm/px · 4 of 4 slices shown]
[im 1/4]
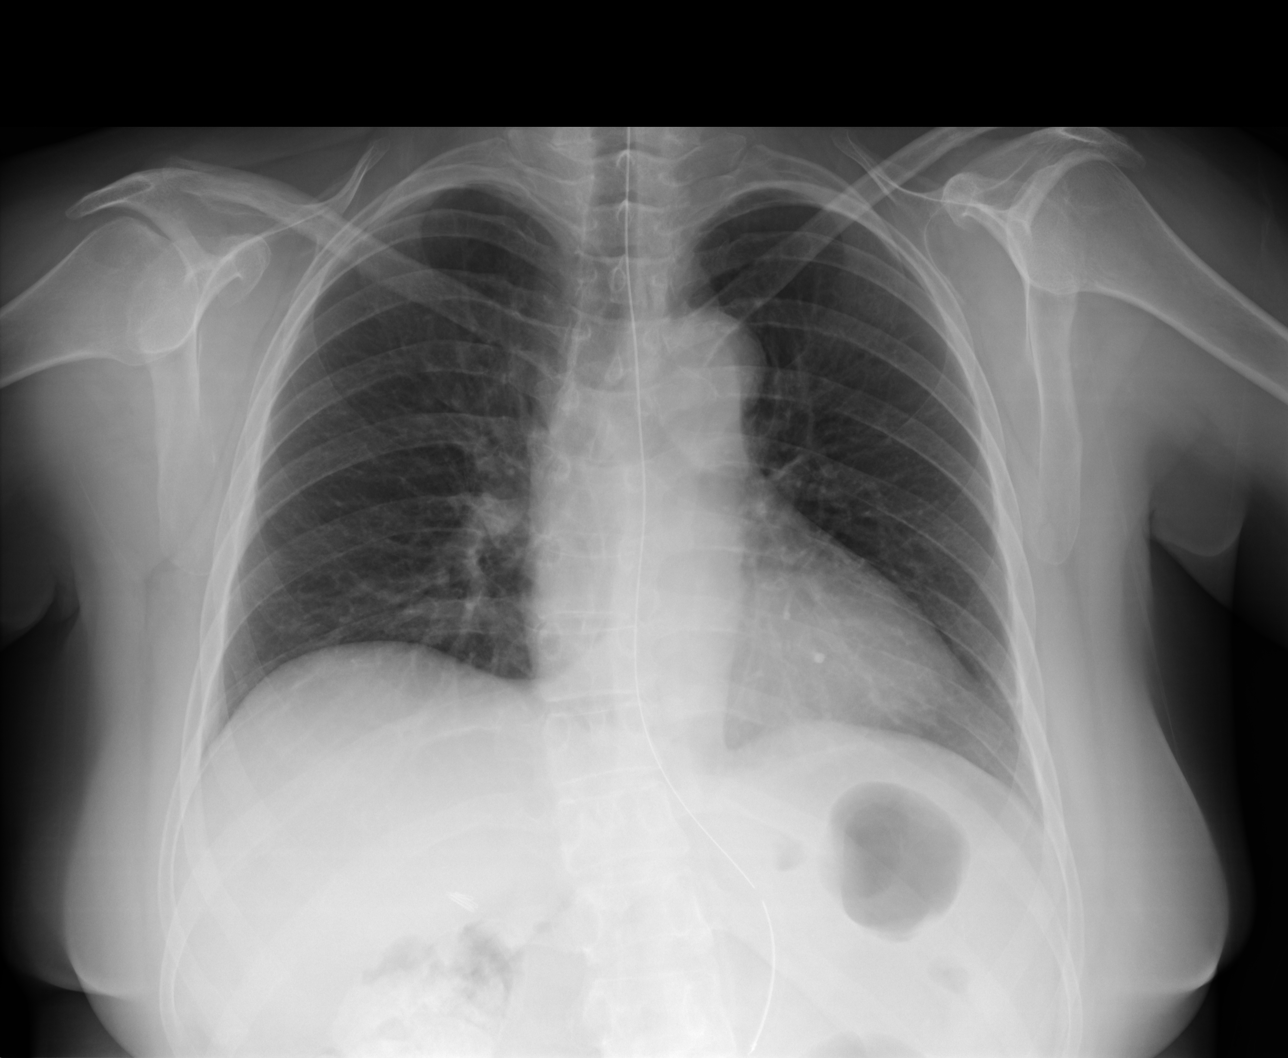
[im 2/4]
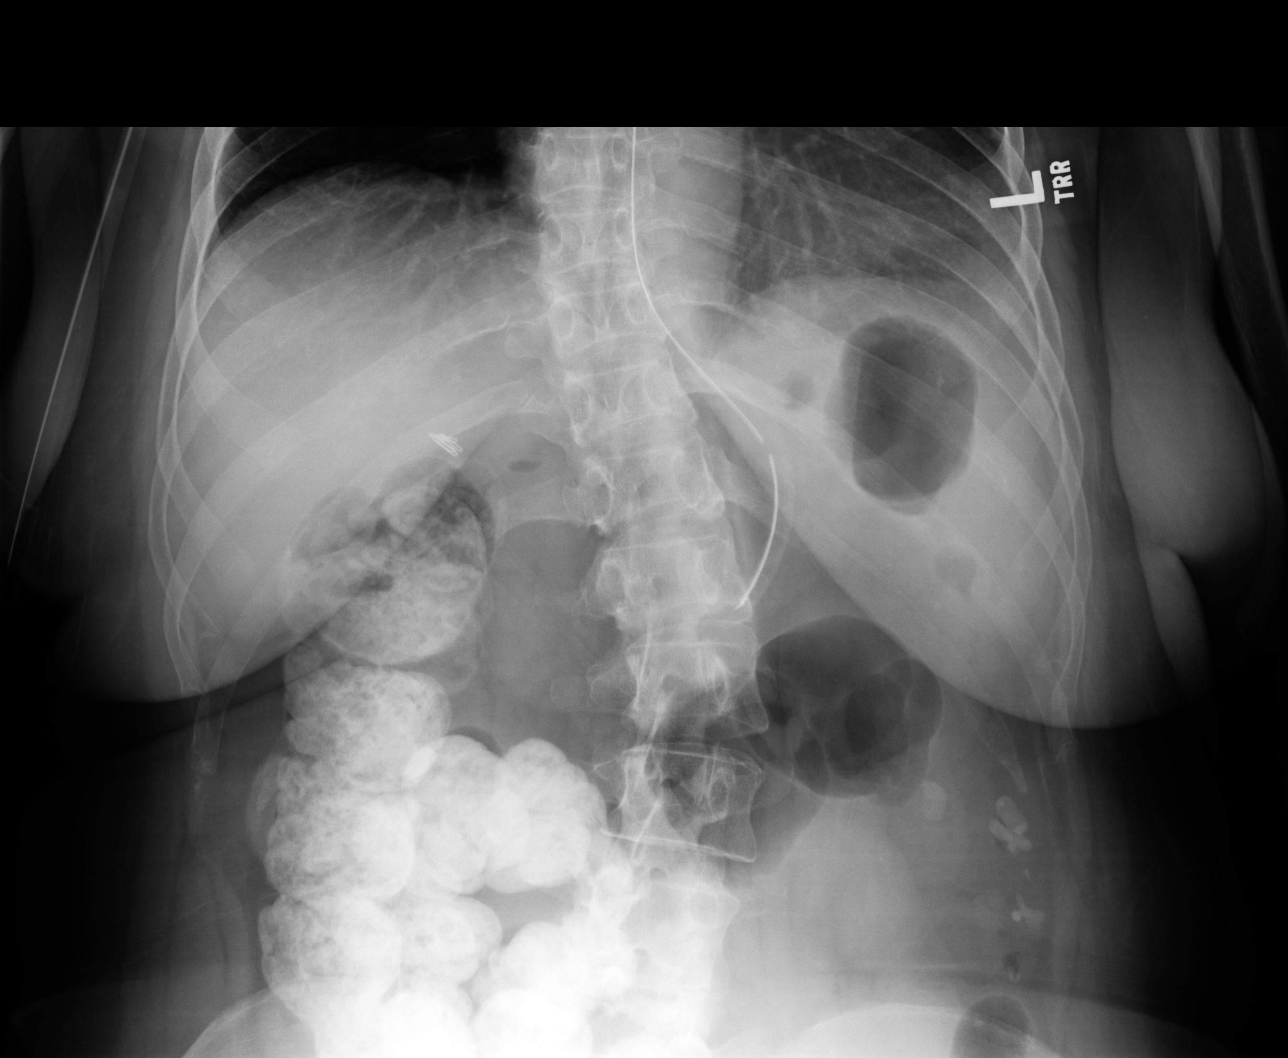
[im 3/4]
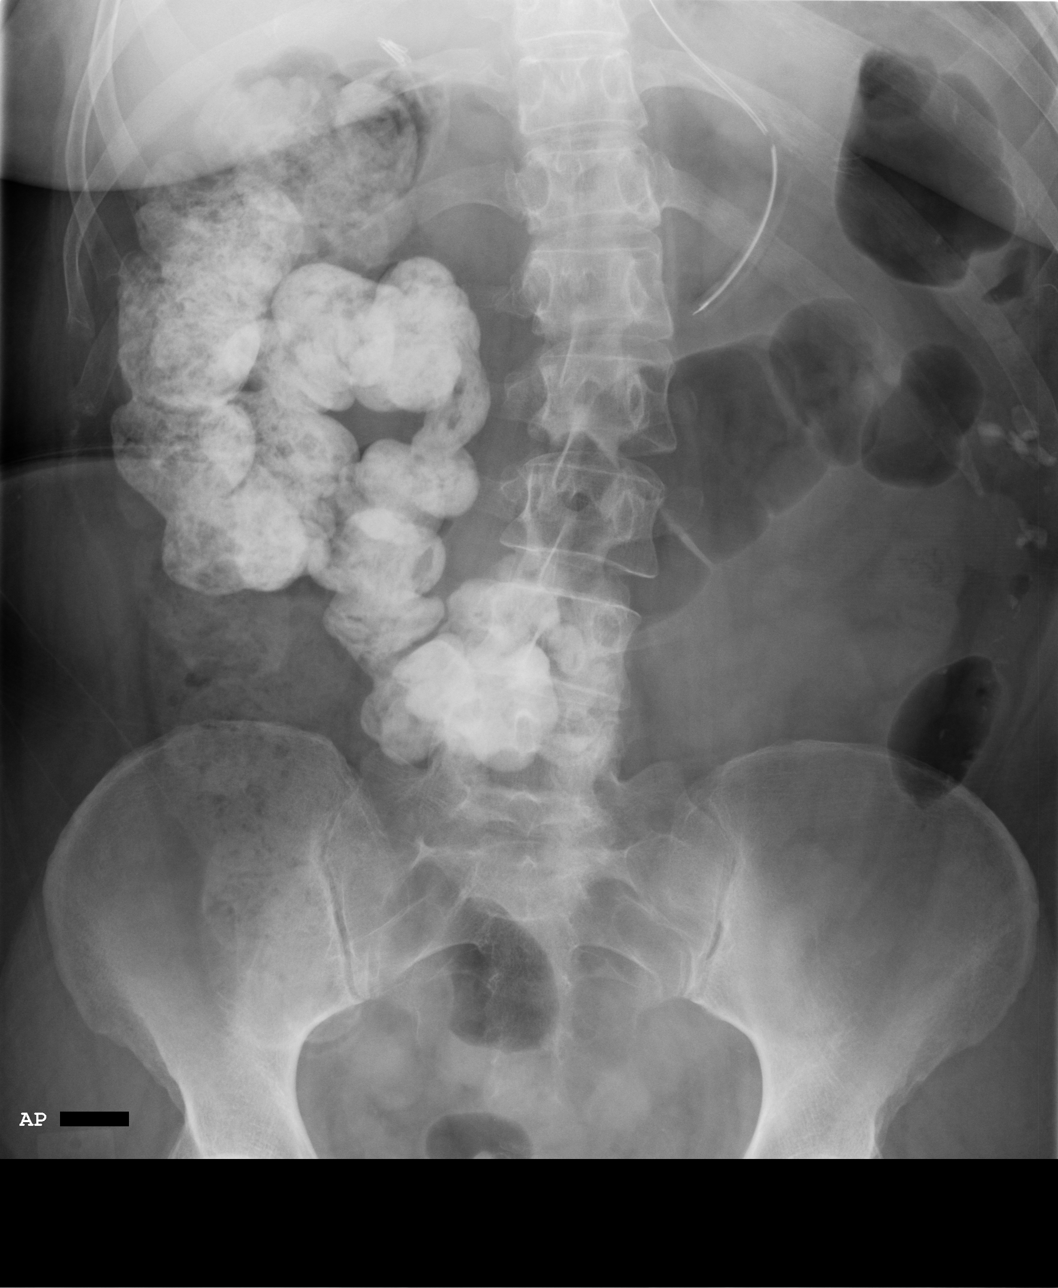
[im 4/4]
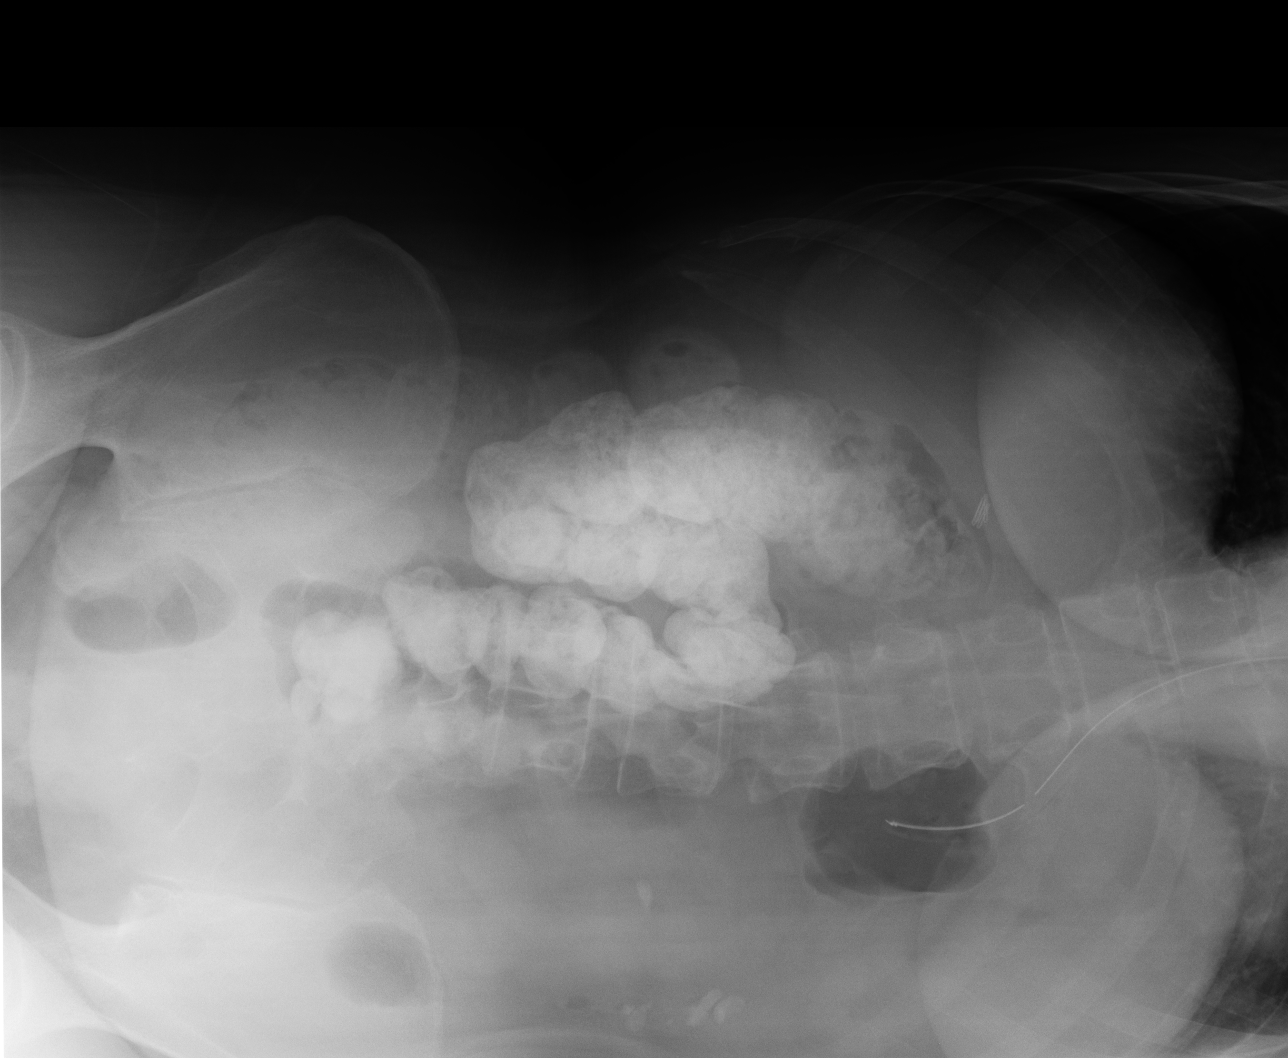

[4 of 4 positions shown; findings below may reference images not displayed]

FINDINGS: PA chest and supine, upright, and left lateral decubitus views of the
abdomen are provided.

The chest is clear. The heart and mediastinum are unremarkable. The osseous
structures are unremarkable.

There is a nasogastric tube with the tip projecting over the stomach. There
is contrast present within the colon. There is a nonspecific bowel gas
pattern. There is no bowel dilatation to suggest obstruction. There are no
air-fluid levels. There are no dilated loops, bowel wall thickening, or
evidence of mass effect.  Soft tissue shadows are normal. There is no
evidence of pneumoperitoneum, portal venous gas, or pneumatosis.

There is an S-shaped scoliosis of the thoracolumbar spine.
IMPRESSION: Unremarkable abdominal series.

## 2012-03-11 IMAGING — CR DG ABDOMEN 1V
1 series · 2 of 2 positions shown · non-contrast
Comparison: none

REASON FOR EXAM: abd pain
COMMENTS:

[Series 1: view not recorded · 0.17mm/px · 2 of 2 slices shown]
[im 1/2]
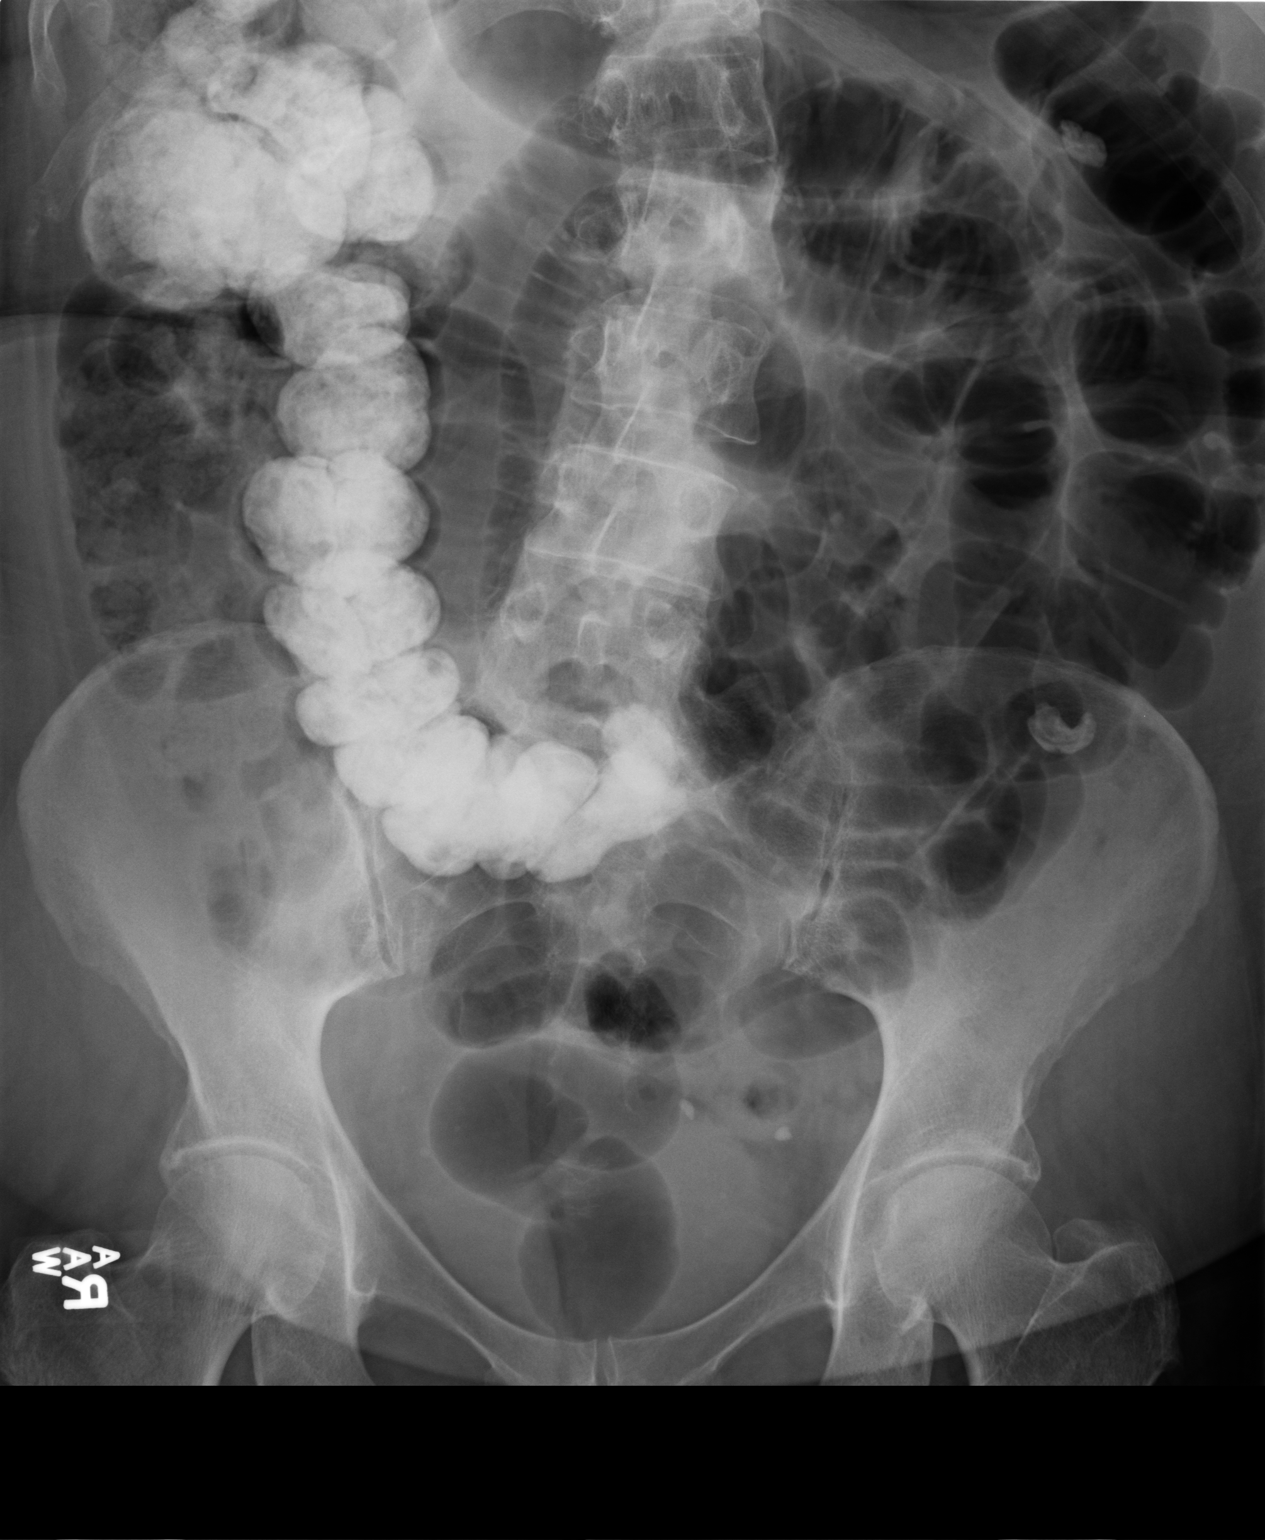
[im 2/2]
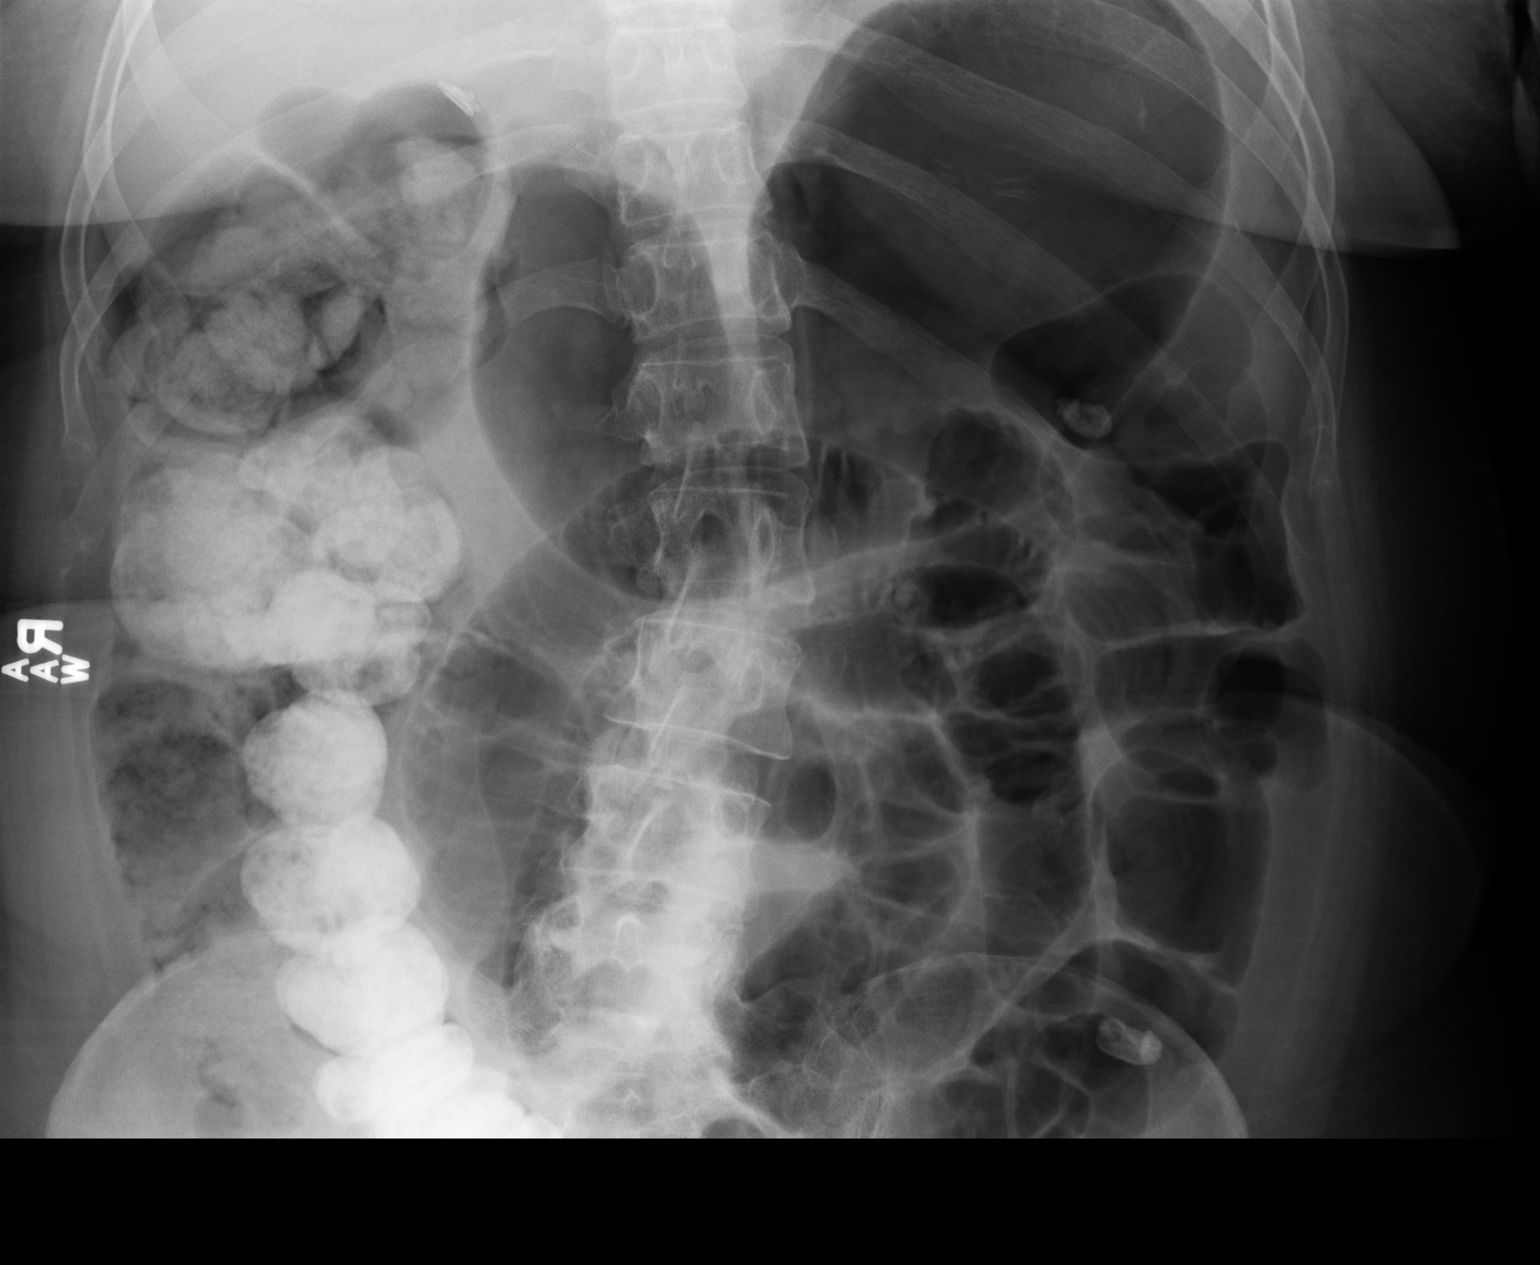

[2 of 2 positions shown; findings below may reference images not displayed]

PROCEDURE:     DXR - DXR KIDNEY URETER BLADDER  - April 18, 2010  [DATE]

RESULT:     Air filled loops of small and large bowel are noted. The small
bowel is slightly dilated. Contrast is noted in the colon. These findings
are most consistent with adynamic ileus. Calcifications are noted in the
pelvis. These could represent phleboliths versus distal ureteral stones.
Clinical correlation is suggested. These calcifications were not noted on
the prior study.
IMPRESSION: Findings consistent with adynamic ileus. Please see complete
discussion above.

## 2012-03-25 ENCOUNTER — Emergency Department: Payer: Self-pay | Admitting: Unknown Physician Specialty

## 2012-03-25 LAB — COMPREHENSIVE METABOLIC PANEL
Albumin: 3.4 g/dL (ref 3.4–5.0)
Alkaline Phosphatase: 167 U/L — ABNORMAL HIGH (ref 50–136)
Calcium, Total: 9.1 mg/dL (ref 8.5–10.1)
Co2: 26 mmol/L (ref 21–32)
Creatinine: 0.74 mg/dL (ref 0.60–1.30)
Glucose: 292 mg/dL — ABNORMAL HIGH (ref 65–99)
SGPT (ALT): 47 U/L
Sodium: 140 mmol/L (ref 136–145)

## 2012-03-25 LAB — TROPONIN I: Troponin-I: <0.02 ng/mL

## 2012-03-25 LAB — CBC
MCH: 30.3 pg (ref 26.0–34.0)
MCHC: 31.6 g/dL — ABNORMAL LOW (ref 32.0–36.0)
Platelet: 169 10*3/uL (ref 150–440)
RDW: 13.6 % (ref 11.5–14.5)

## 2012-03-25 LAB — LIPASE, BLOOD: Lipase: 230 U/L (ref 73–393)

## 2012-03-28 ENCOUNTER — Observation Stay: Payer: Self-pay | Admitting: Internal Medicine

## 2012-03-28 LAB — COMPREHENSIVE METABOLIC PANEL
Anion Gap: 12 (ref 7–16)
BUN: 20 mg/dL — ABNORMAL HIGH (ref 7–18)
Bilirubin,Total: 2 mg/dL — ABNORMAL HIGH (ref 0.2–1.0)
Chloride: 101 mmol/L (ref 98–107)
Co2: 24 mmol/L (ref 21–32)
Creatinine: 0.76 mg/dL (ref 0.60–1.30)
EGFR (African American): >60
EGFR (Non-African Amer.): >60
Osmolality: 286 (ref 275–301)
Potassium: 3.8 mmol/L (ref 3.5–5.1)
SGPT (ALT): 68 U/L
Total Protein: 7.5 g/dL (ref 6.4–8.2)

## 2012-03-28 LAB — URINALYSIS, COMPLETE
Bacteria: NONE SEEN
Bilirubin,UR: NEGATIVE
Glucose,UR: 50 mg/dL (ref 0–75)
Hyaline Cast: 2
WBC UR: 12 /HPF (ref 0–5)

## 2012-03-28 LAB — LIPASE, BLOOD: Lipase: 242 U/L (ref 73–393)

## 2012-03-28 LAB — CBC
HGB: 15.8 g/dL (ref 12.0–16.0)
MCH: 30.5 pg (ref 26.0–34.0)
MCHC: 32.3 g/dL (ref 32.0–36.0)
RDW: 13.7 % (ref 11.5–14.5)

## 2012-03-29 LAB — COMPREHENSIVE METABOLIC PANEL
Alkaline Phosphatase: 117 U/L (ref 50–136)
Bilirubin,Total: 0.8 mg/dL (ref 0.2–1.0)
Calcium, Total: 7.9 mg/dL — ABNORMAL LOW (ref 8.5–10.1)
EGFR (Non-African Amer.): >60
Potassium: 4 mmol/L (ref 3.5–5.1)
SGPT (ALT): 49 U/L

## 2012-03-29 LAB — CBC WITH DIFFERENTIAL/PLATELET
Basophil #: 0 10*3/uL (ref 0.0–0.1)
HCT: 39.2 % (ref 35.0–47.0)
Lymphocyte #: 1.4 10*3/uL (ref 1.0–3.6)
MCH: 31.4 pg (ref 26.0–34.0)
MCHC: 33.1 g/dL (ref 32.0–36.0)
MCV: 95 fL (ref 80–100)
Monocyte #: 0.2 x10 3/mm (ref 0.2–0.9)
Monocyte %: 3.4 %
Neutrophil #: 4.5 10*3/uL (ref 1.4–6.5)
Platelet: 104 10*3/uL — ABNORMAL LOW (ref 150–440)
RDW: 13.5 % (ref 11.5–14.5)
WBC: 6.1 10*3/uL (ref 3.6–11.0)

## 2012-03-29 LAB — HEMOGLOBIN A1C: Hemoglobin A1C: 8.9 % — ABNORMAL HIGH (ref 4.2–6.3)

## 2012-03-30 LAB — URINE CULTURE

## 2012-03-30 IMAGING — US ABDOMEN ULTRASOUND LIMITED
1 series · 17 of 25 positions shown · non-contrast
Comparison: none

REASON FOR EXAM: RUQ/epigastric pain
COMMENTS:

[Series 1: abdomen ultrasound limited · 17 of 34 slices shown]
[im 1/34]
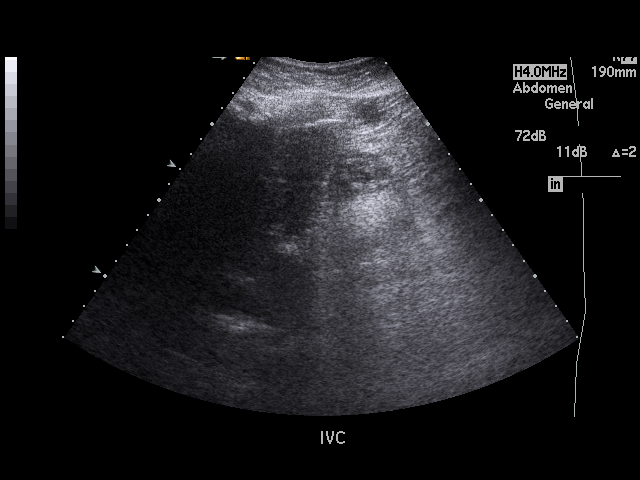
[im 3/34]
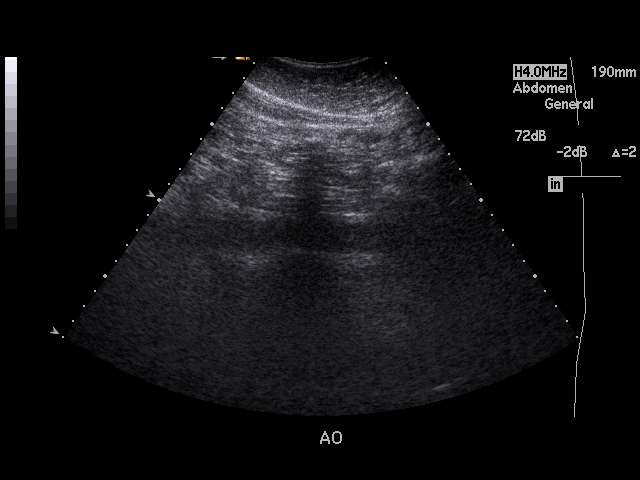
[im 5/34]
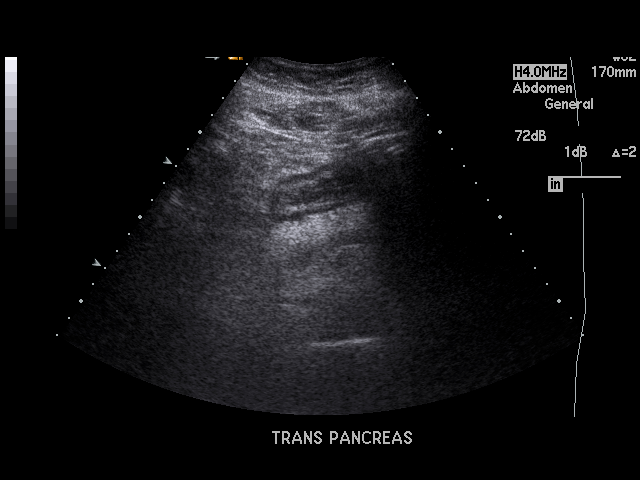
[im 7/34]
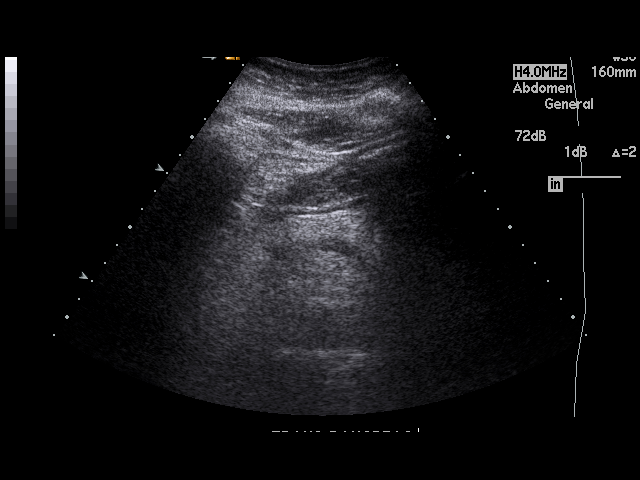
[im 9/34]
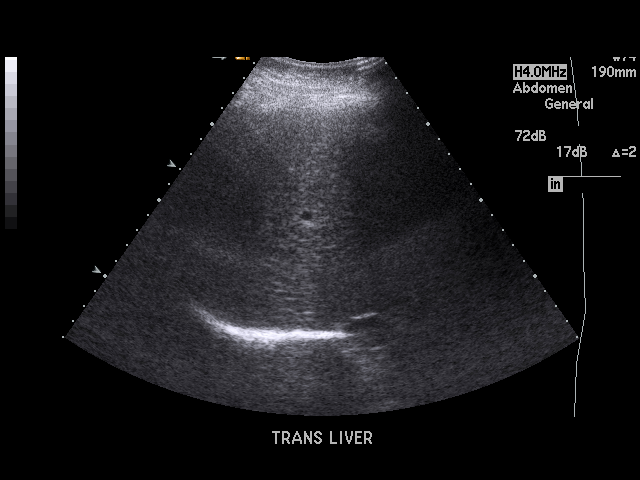
[im 12/34]
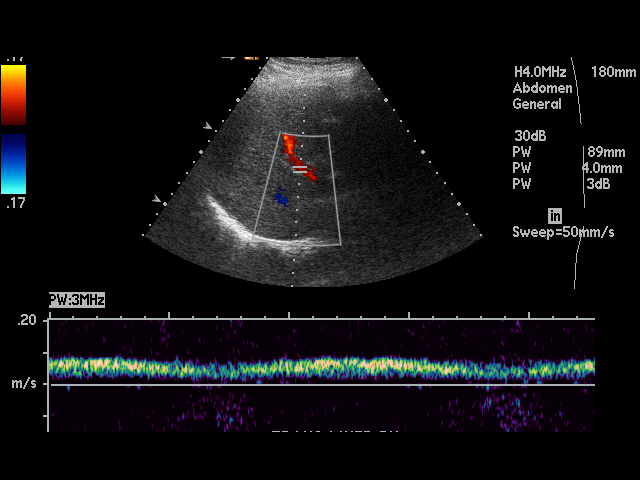
[im 13/34]
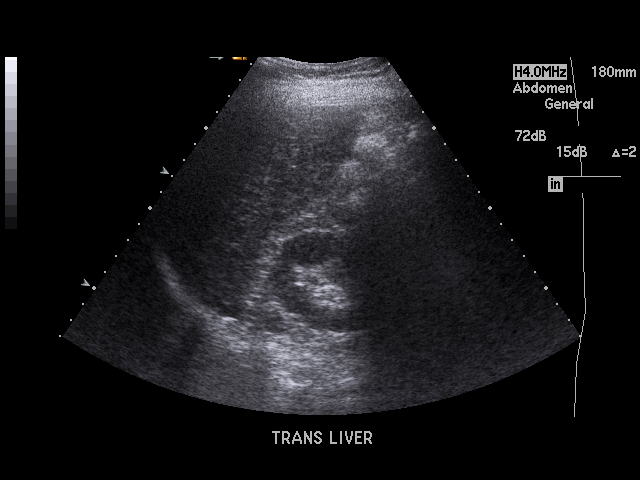
[im 16/34]
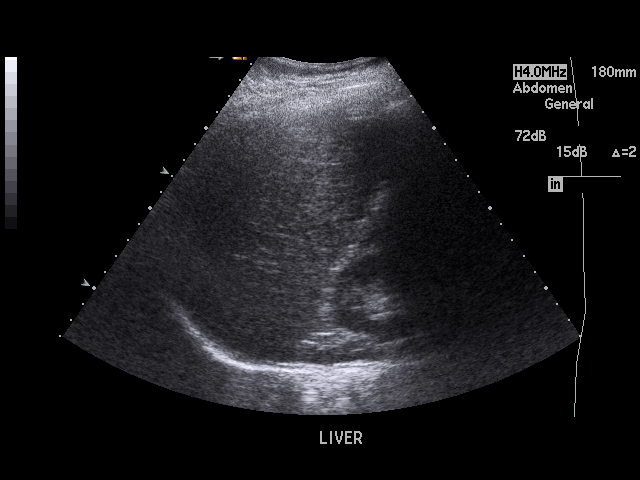
[im 17/34]
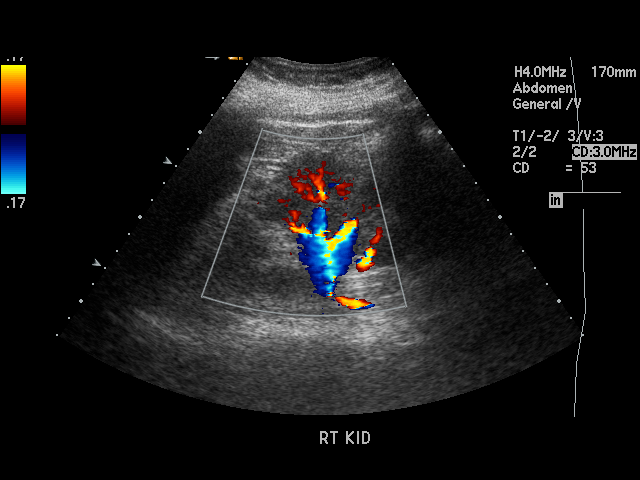
[im 18/34]
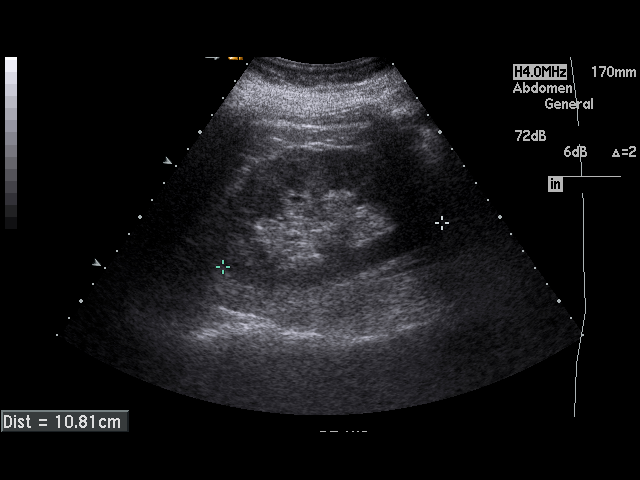
[im 21/34]
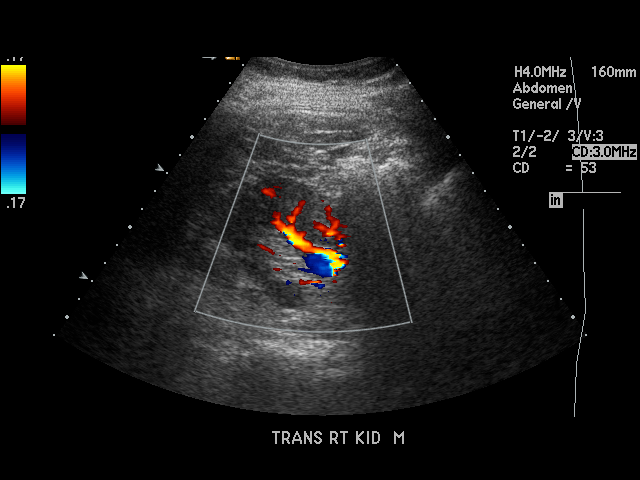
[im 23/34]
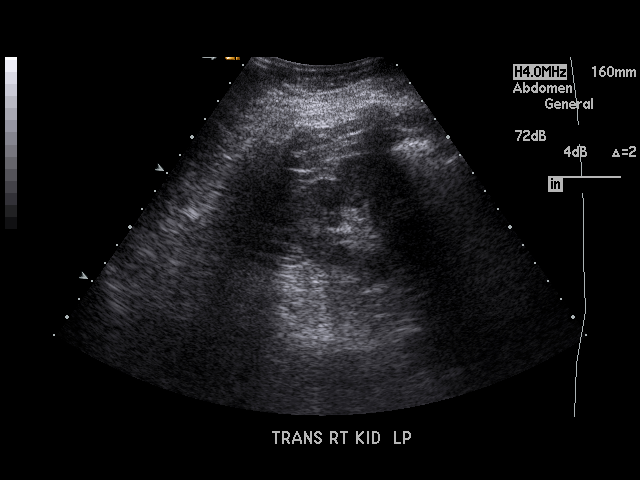
[im 25/34]
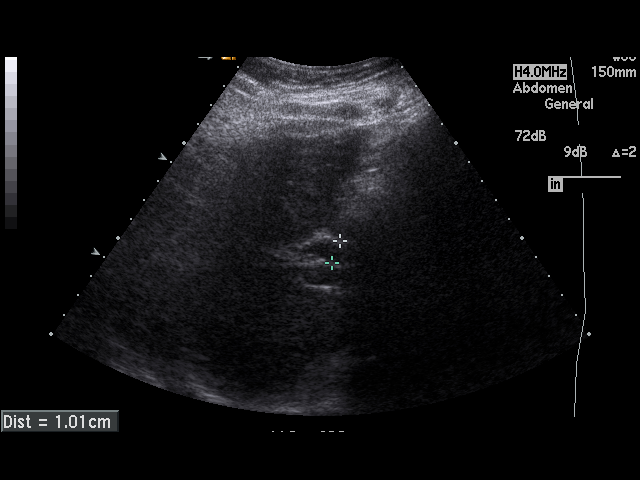
[im 27/34]
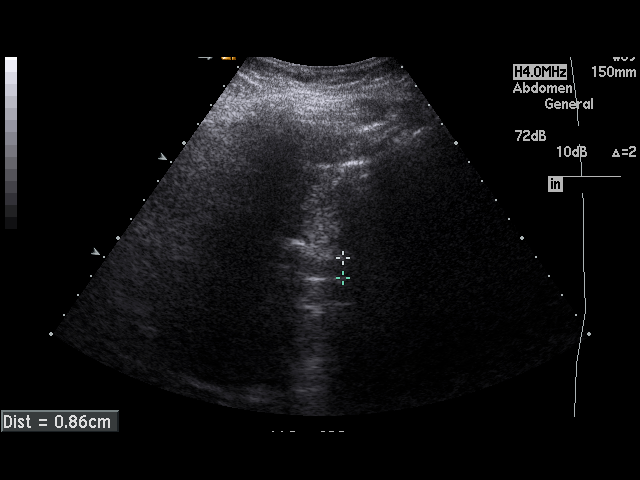
[im 29/34]
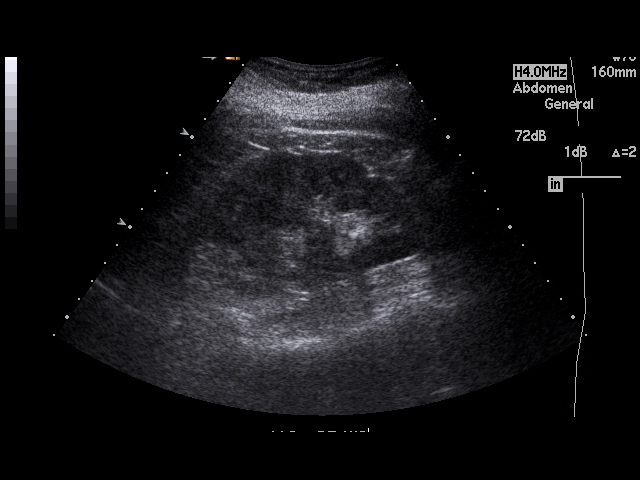
[im 31/34]
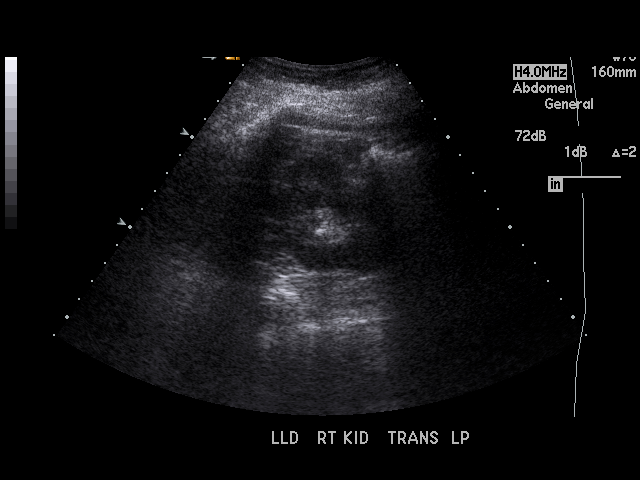
[im 34/34]
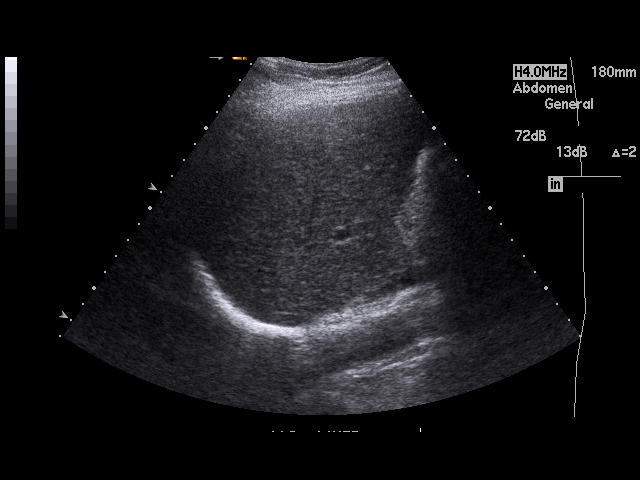

[17 of 25 positions shown; findings below may reference images not displayed]

PROCEDURE:     US  - US ABDOMEN LIMITED SURVEY  - May 07, 2010  [DATE]

RESULT:     Comparison: Ultrasound 03/25/2009 and CT of the abdomen and
pelvis [DATE]

Technique and Findings:
Multiple grayscale and color Doppler images obtained of the right upper
quadrant.

The liver is relatively hyperechoic, suggesting hepatic steatosis. The
portal vein is patent. The patient is status post cholecystectomy. The
common bile duct measures 10 mm in greatest diameter. This is unchanged from
recent prior CT.

A 6 mm stone is demonstrated in the right kidney. There is no hydronephrosis.
IMPRESSION: 1. Mild dilatation of the common bile duct may be related to prior
cholecystectomy.
2. Right-sided nephrolithiasis, without hydronephrosis.

## 2012-04-03 LAB — CULTURE, BLOOD (SINGLE)

## 2012-07-30 IMAGING — CT CT ABD-PELV W/O CM
1 of 2 series · 15 of 32 positions shown, 19 images · non-contrast
Comparison: none

REASON FOR EXAM: (1) abd pain ileus; (2) abd pain ileus
COMMENTS:   May transport without cardiac monitor

[Series 2: 3mm soft tissue · axial · 0.68mm/px · z∈[+18,+465]mm · 15 of 163 slices shown, 19 images]
[im 7/163  soft-tissue]
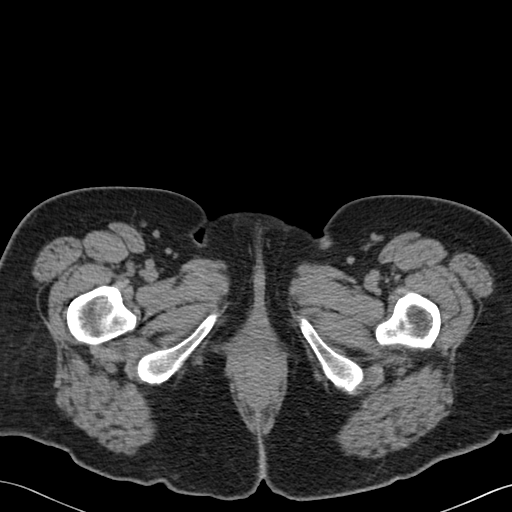
[im 7/163  bone]
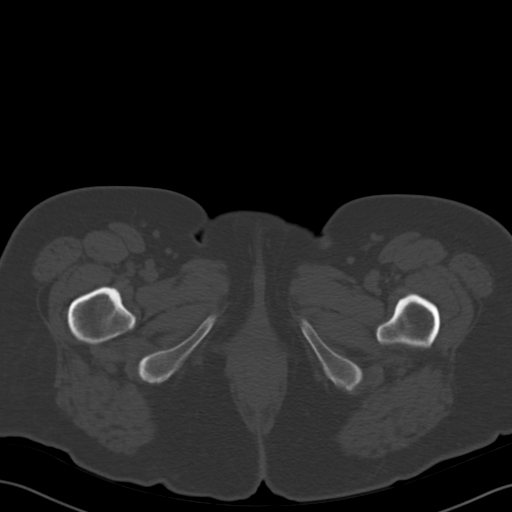
[im 20/163  soft-tissue]
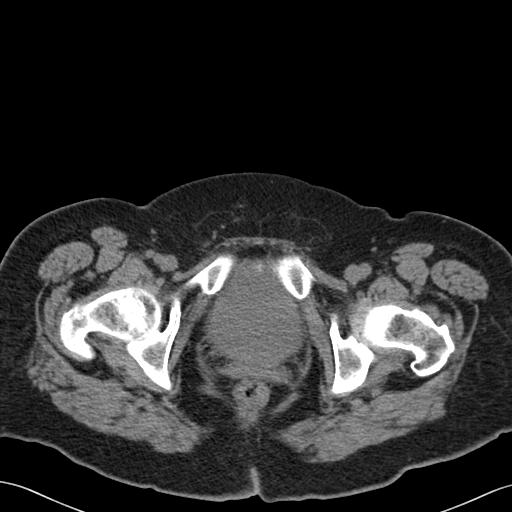
[im 33/163  soft-tissue]
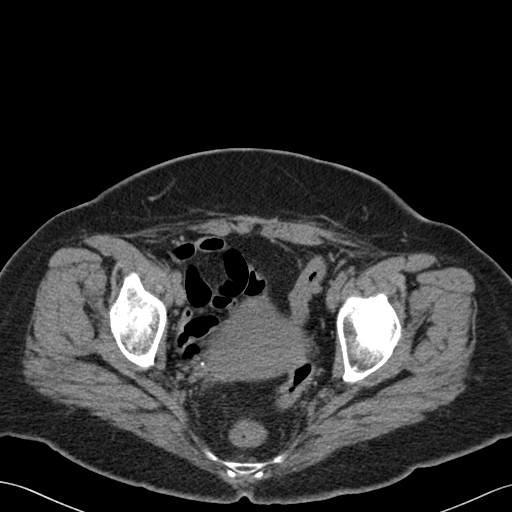
[im 46/163  soft-tissue]
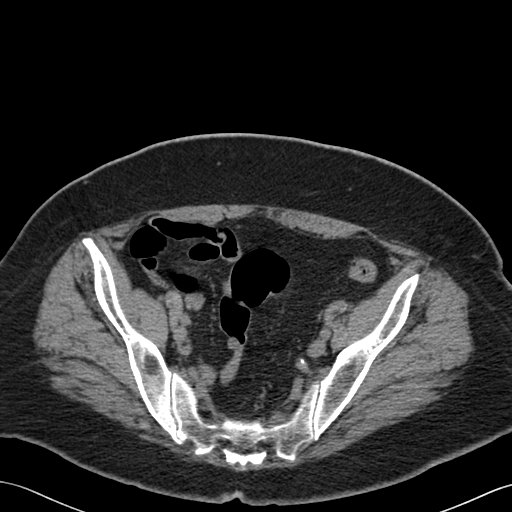
[im 59/163  soft-tissue]
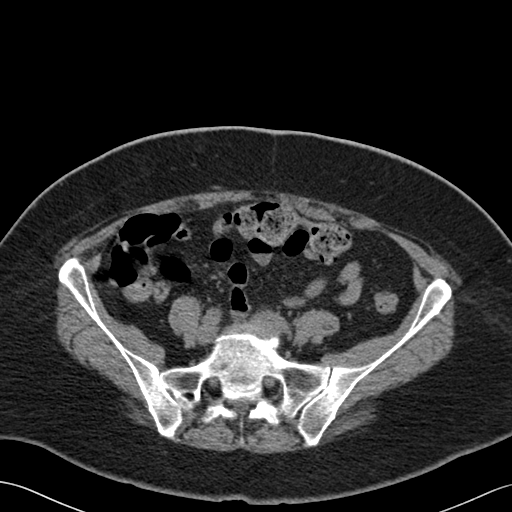
[im 72/163  soft-tissue]
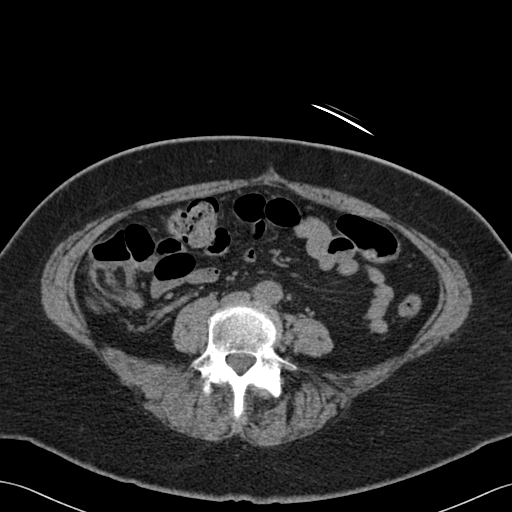
[im 85/163  soft-tissue]
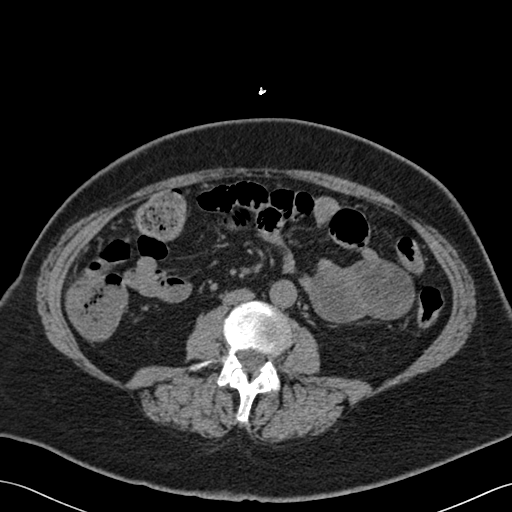
[im 91/163  soft-tissue]
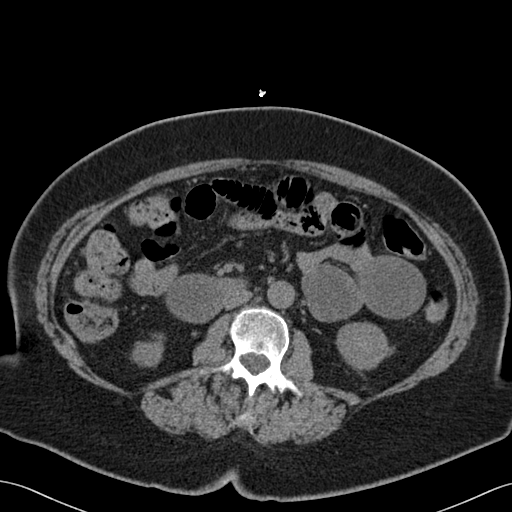
[im 104/163  soft-tissue]
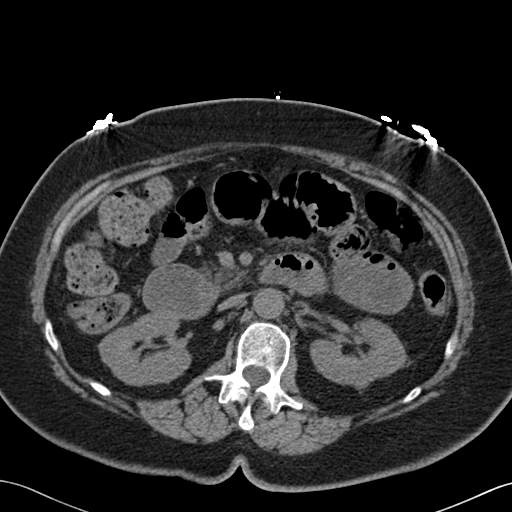
[im 104/163  bone]
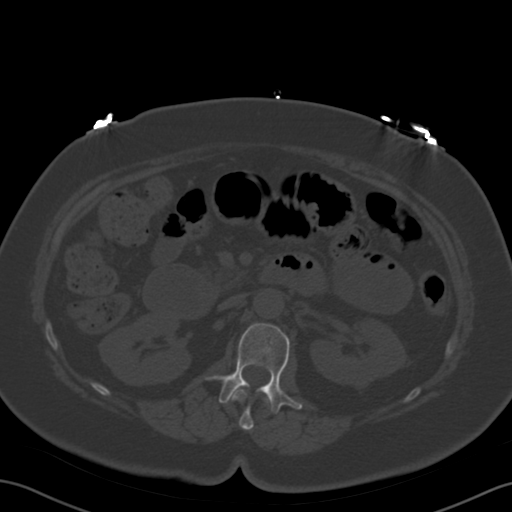
[im 117/163  soft-tissue]
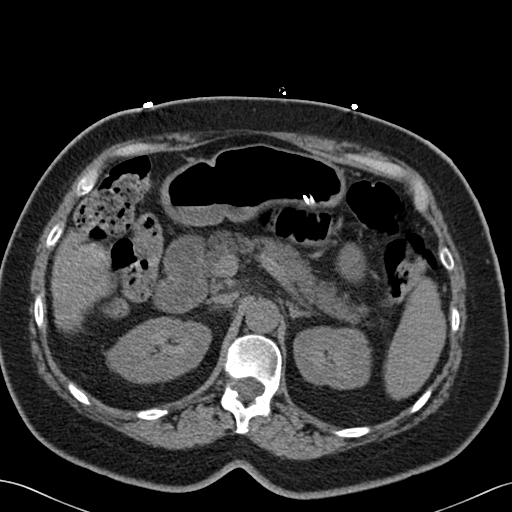
[im 130/163  soft-tissue]
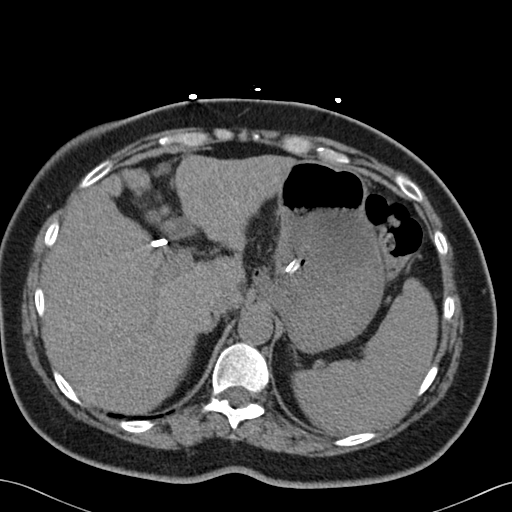
[im 137/163  lung]
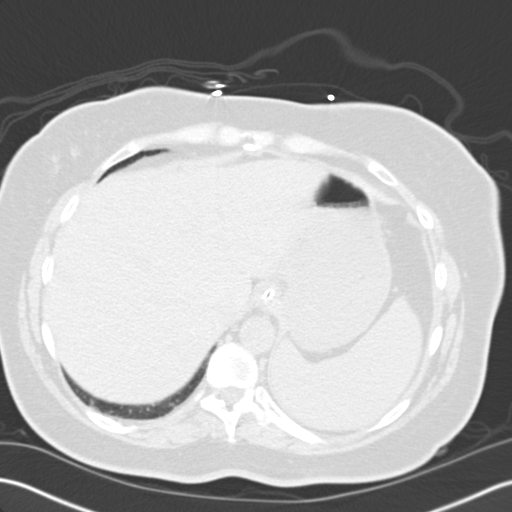
[im 143/163  soft-tissue]
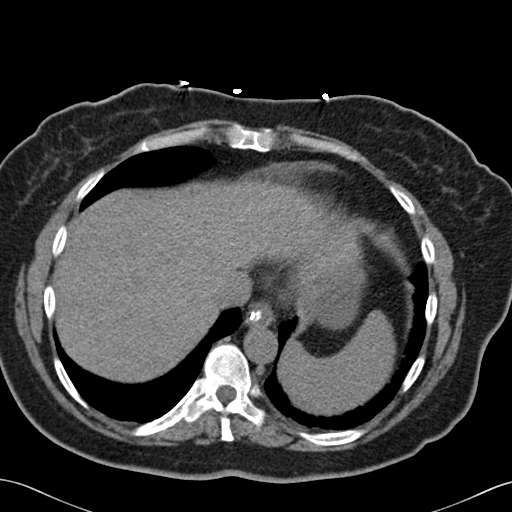
[im 143/163  lung]
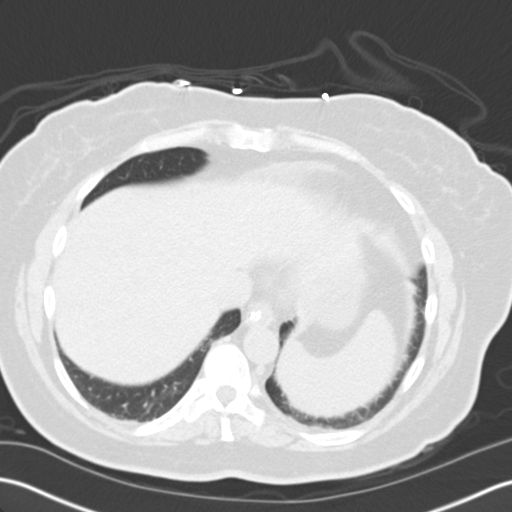
[im 150/163  lung]
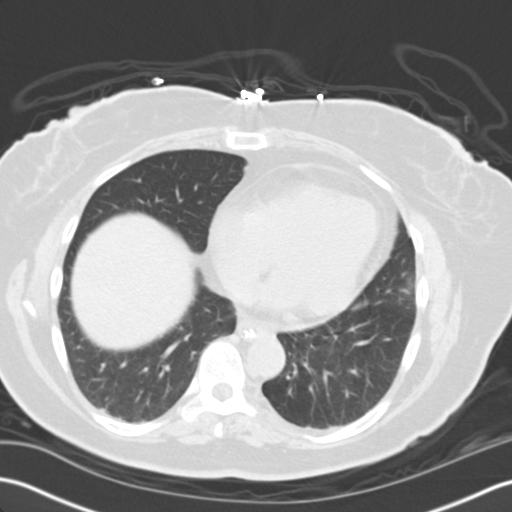
[im 156/163  soft-tissue]
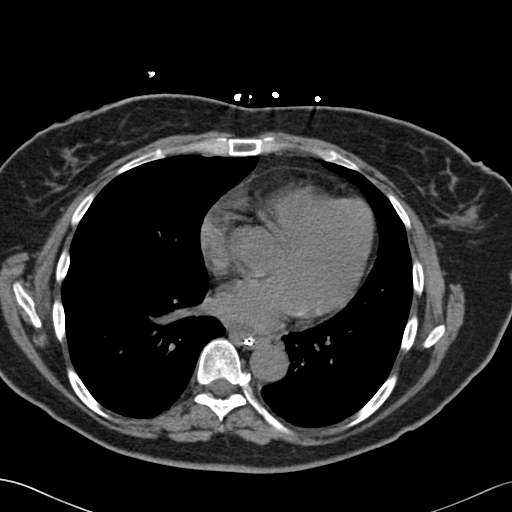
[im 156/163  lung]
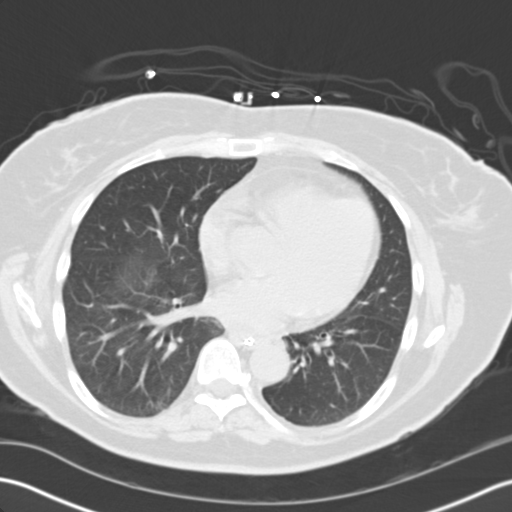

[15 of 32 positions shown; findings below may reference images not displayed]

PROCEDURE:     CT  - CT ABDOMEN AND PELVIS W[DATE]  [DATE]

RESULT:     Axial noncontrast CT scanning was performed through the abdomen
and pelvis at 3 mm intervals and slice thicknesses. Review of multiplanar
reconstructed images was performed separately on the VIA monitor.

The liver, spleen, adrenal glands, and kidneys exhibit no acute abnormality.
There are atrophic and fatty infiltrative changes of the pancreas. The
pancreatic head is somewhat ill-defined a focal pancreatitis cannot be
absolutely excluded. A moderate amount of fluid is in the adjacent duodenum.
There are nonobstructing stones in both kidneys. The gallbladder is
surgically absent. The stomach is partially distended with fluid and gas.
There is a nasogastric tube in place.

The caliber of the abdominal aorta is normal. The unopacified loops of small
and large bowel exhibit no evidence of ileus or of obstruction. Within the
pelvis the urinary bladder is partially distended and grossly normal. There
are phleboliths within the pelvis. I see no free pelvic fluid. The
rectosigmoid colon is normal in appearance. The uterus is surgically absent.
I see no adnexal masses. The lung bases are clear.
IMPRESSION: 1. There is some poor definition of the pancreatic head which may be normal
for the patient and be related to fatty infiltration but focal pancreatitis
cannot be excluded in the appropriate clinical setting. Correlation
clinically is needed. I do not see evidence of acute hepatobiliary
abnormality otherwise.
2. There is no evidence of bowel obstruction. I cannot exclude ileus
involving the upper small bowel.
3. There are nonobstructing stones in both kidneys.
4. I see no acute abnormality within the pelvis.

A preliminary report was sent to the [HOSPITAL] the conclusion
of the study.

## 2012-07-30 IMAGING — CR DG CHEST 1V PORT
1 series · 1 of 1 positions shown · non-contrast
Comparison: none

REASON FOR EXAM: chest pain
COMMENTS:

PROCEDURE:     DXR - DXR PORTABLE CHEST SINGLE VIEW  - September 06, 2010  [DATE]
RESULT:     Comparison is made to the prior exam of 03/08/2010.
The lung fields are clear. The heart, mediastinal and osseous structures
show no acute changes.

[view not recorded]
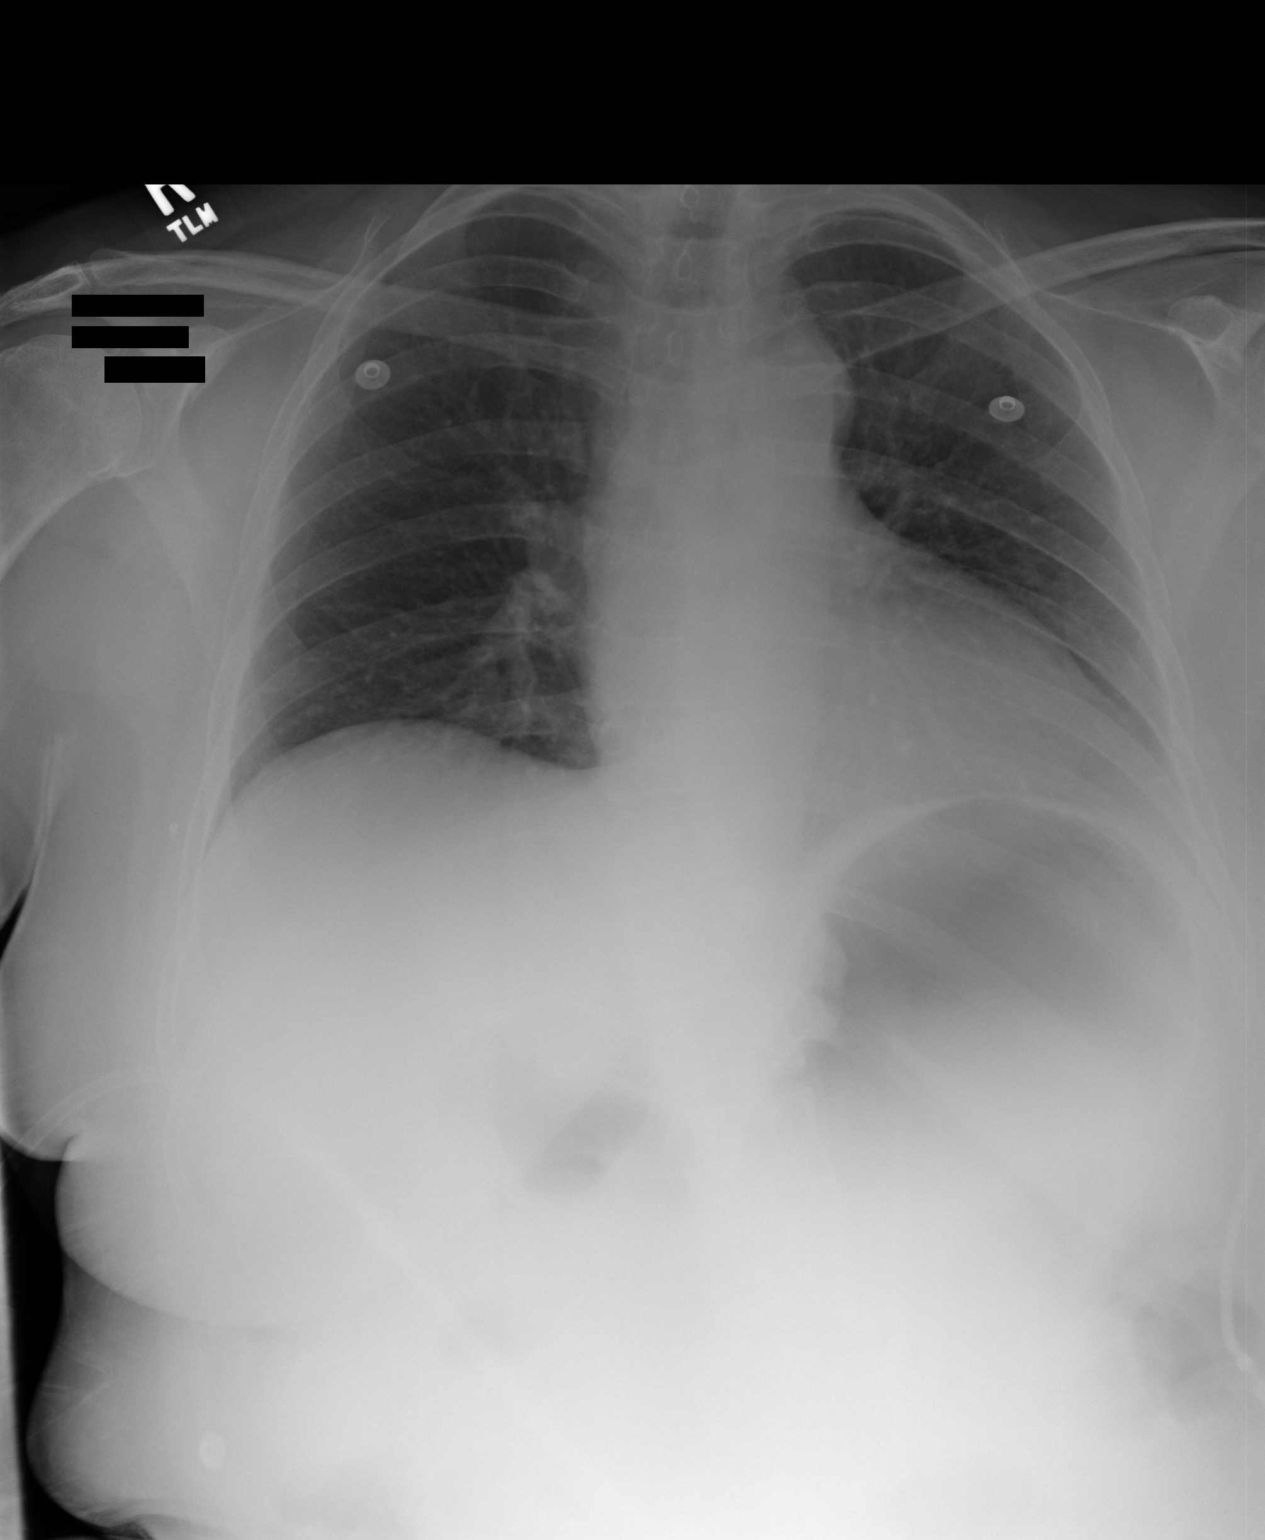

[1 of 1 positions shown; findings below may reference images not displayed]

IMPRESSION: 1.  No acute changes are identified.
2.  Incidental note is made of a mild thoracic scoliosis, unchanged in
appearance as compared to the prior exam.

## 2012-07-30 IMAGING — CR DG ABDOMEN 2V
1 series · 2 of 2 positions shown · non-contrast
Comparison: none

REASON FOR EXAM: abd pain vomiting
COMMENTS:

[Series 1: view not recorded · 0.17mm/px · 2 of 2 slices shown]
[im 1/2]
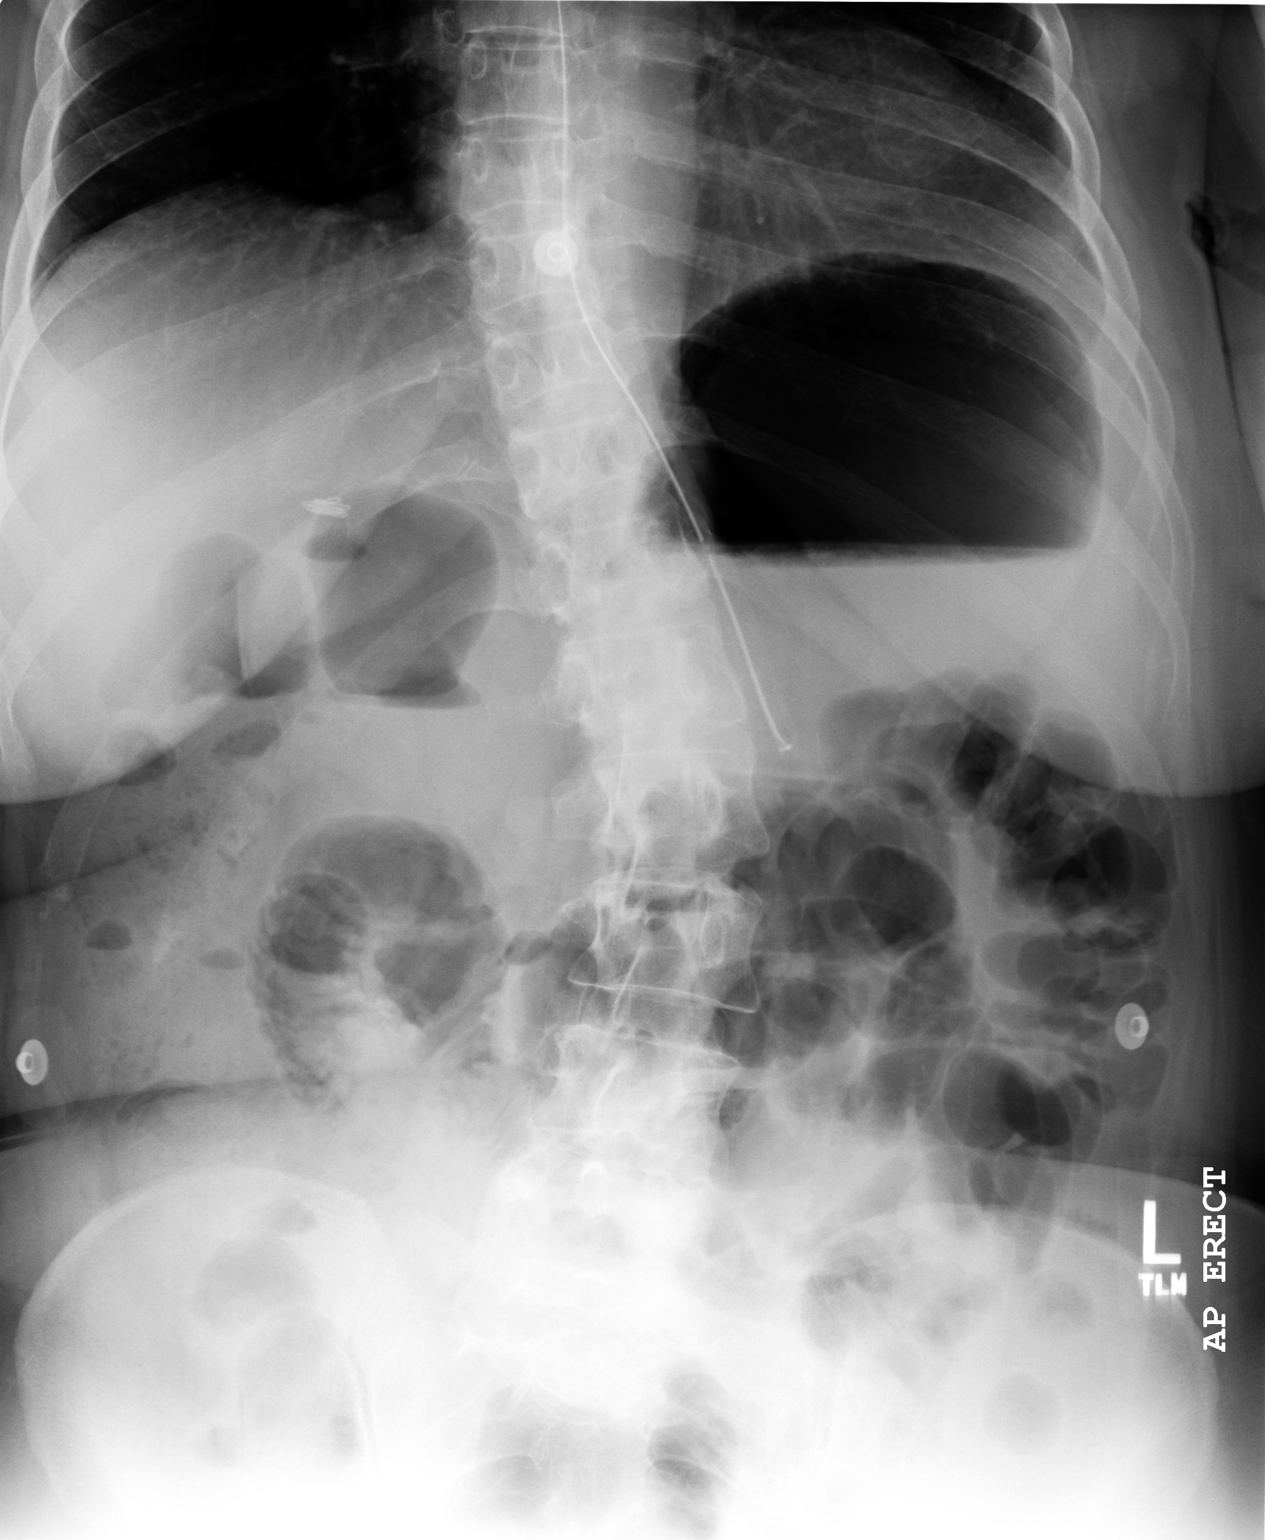
[im 2/2]
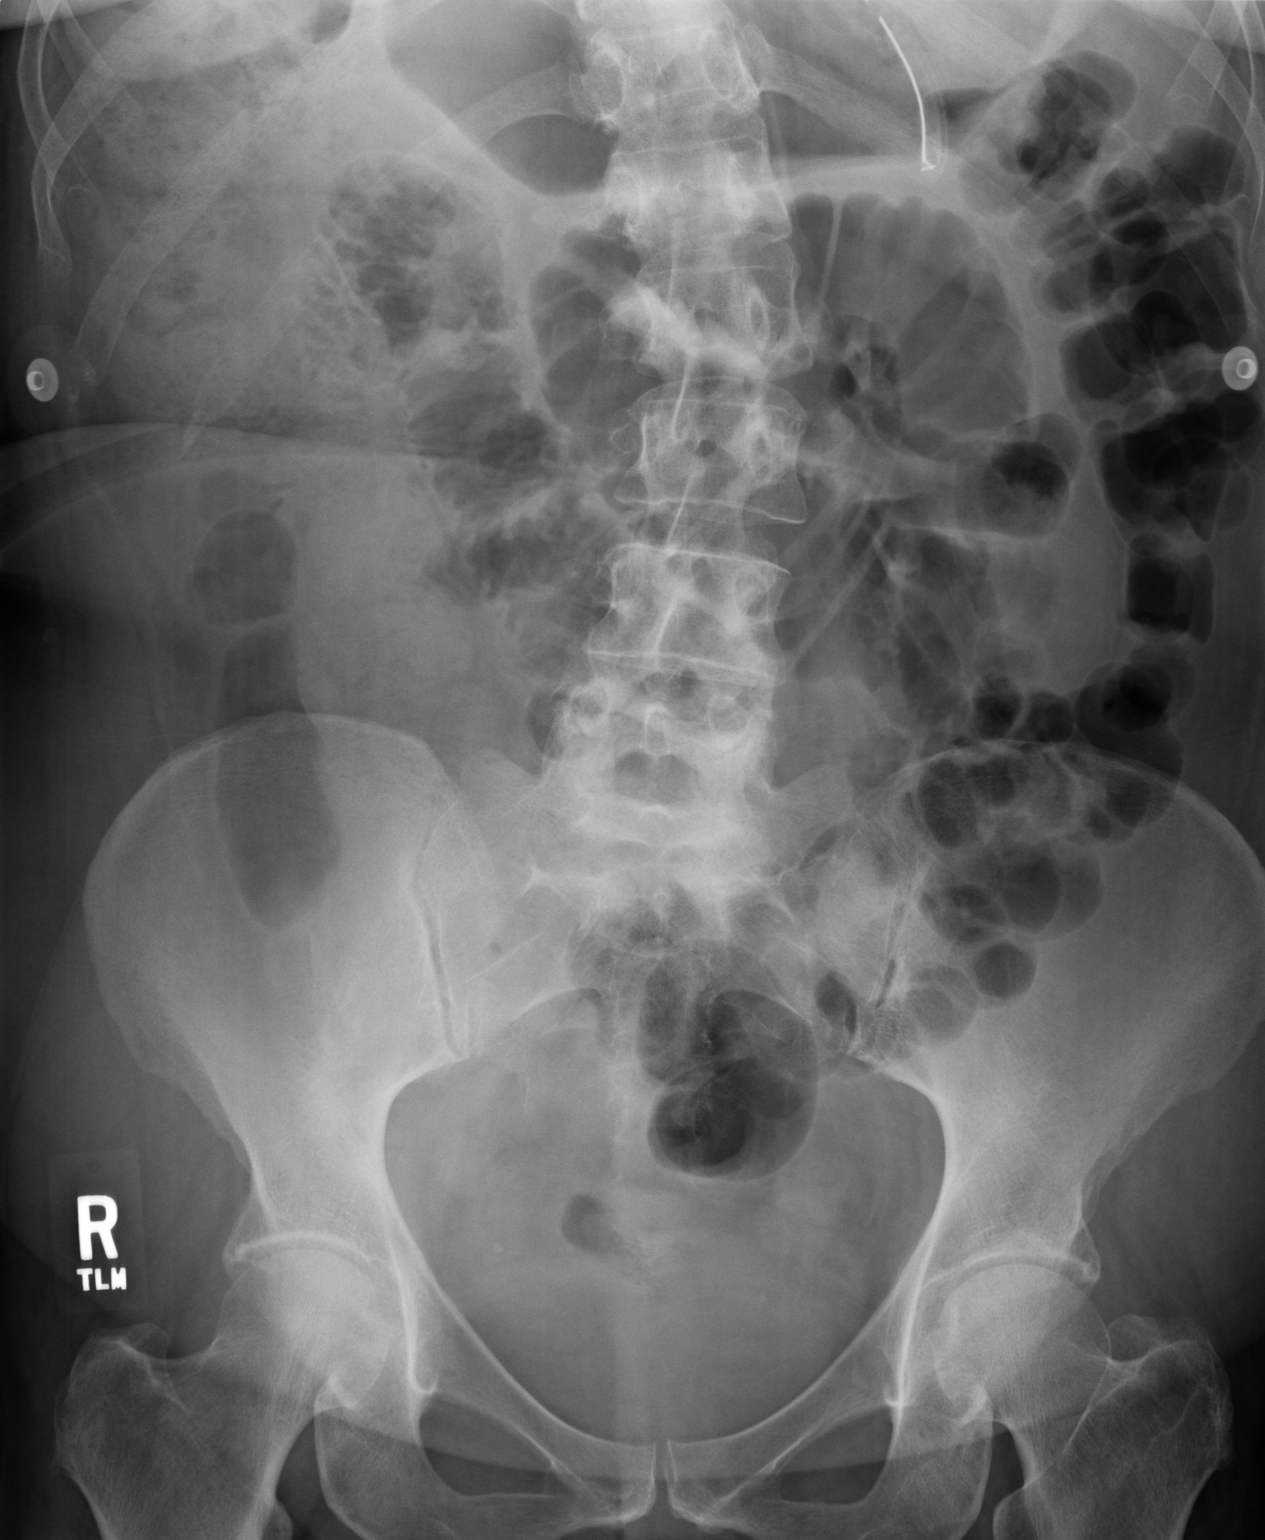

[2 of 2 positions shown; findings below may reference images not displayed]

PROCEDURE:     DXR - DXR ABDOMEN 2 V FLAT AND ERECT  - September 06, 2010  [DATE]

RESULT:     Flat and erect views of the abdomen were obtained. No
subdiaphragmatic free air is seen. There is mild gastric distention with
fluid and air. A nasogastric tube is present with the distal portion in the
stomach. There is mild distention of a few loops of small bowel, most
compatible with an ileus pattern. There are a few fluid levels in the right
upper quadrant which appear likely to be in colon although fluid levels in
the duodenum cannot be totally ruled out at this point. The findings may be
transient or could be secondary to intra-abdominal inflammation such as
pancreatitis. There are noted post operative metallic clips in the region of
the gallbladder bed. Gas is visualized in a nondilated colon with gas
visualized to the level of the rectum.
IMPRESSION: 1.  There is mild dilatation of multiple small bowel loops compatible with a
mild ileus pattern.
2.  There are a few nonspecific fluid levels in the right upper quadrant as
noted above.
3.  There is mild gastric dilatation. A nasogastric tube is present.
4.  No subdiaphragmatic free air is seen.

## 2012-08-06 IMAGING — CT CT ABD-PELV W/ CM
1 of 2 series · 15 of 32 positions shown, 19 images · non-contrast
Comparison: none

RESULT:      CT of the abdomen and pelvis is performed with oral contrast
and 100 mL of 9sovue-5W6 iodinated intravenous contrast. A comparison is
made to previous images of 09/06/2010 and 04/11/2010. Images are reconstructed
at 3 mm slice thickness in the axial plane.

Images through the base of the lungs demonstrate minimal dependent
atelectasis without a discrete mass or infiltrate. There is no pneumothorax.
A nasogastric tube is present with the tip in the body of the stomach. No
abnormal gastric distention is present. There is air and fecal material
scattered through the colon to the rectum without evidence of bowel
obstruction. There is no evidence of abscess or perforation. There is some
subcutaneous fat increased density of the lower pelvic region which may be
the site of subcutaneous injections. Correlate with history. The liver,
spleen, pancreas, kidneys, aorta and adrenal glands appear unremarkable.
Cholecystectomy clips are present. The appendix appears normal. The urinary
bladder is unremarkable. It appears the patient is status post hysterectomy.
No adnexal mass is appreciated.

[Series 2: 3mm soft tissue · axial · 0.66mm/px · z∈[-451,-4]mm · 15 of 163 slices shown, 19 images]
[im 7/163  soft-tissue]
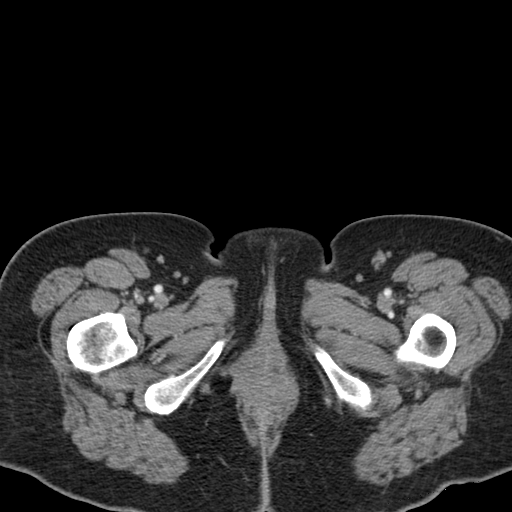
[im 7/163  bone]
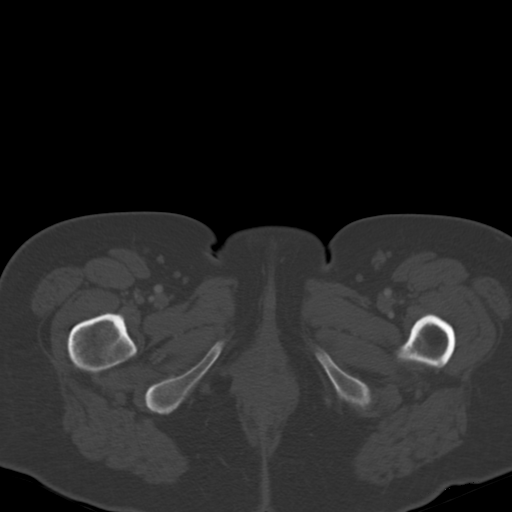
[im 21/163  soft-tissue]
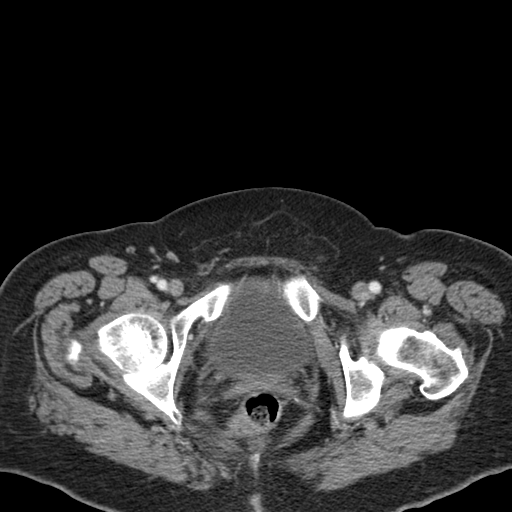
[im 34/163  soft-tissue]
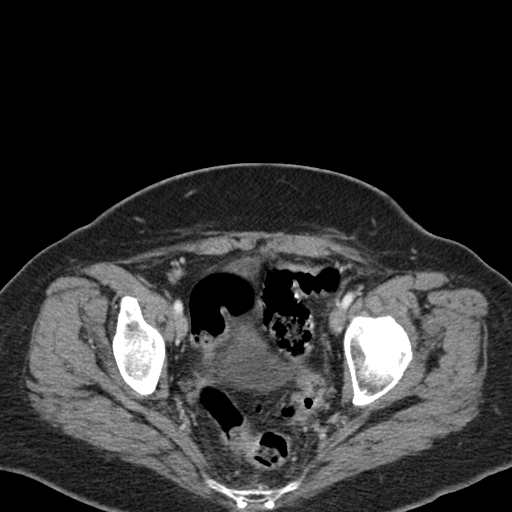
[im 48/163  soft-tissue]
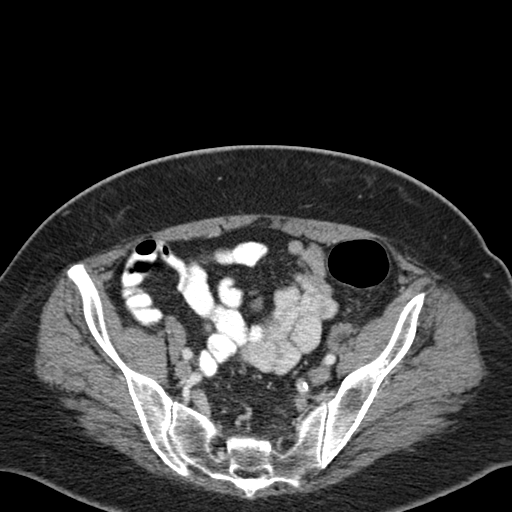
[im 55/163  soft-tissue]
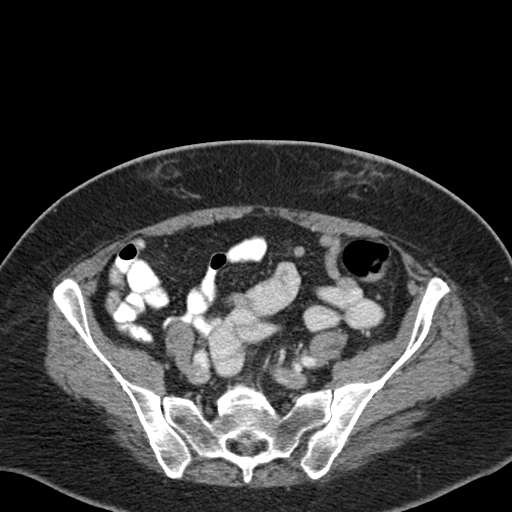
[im 68/163  soft-tissue]
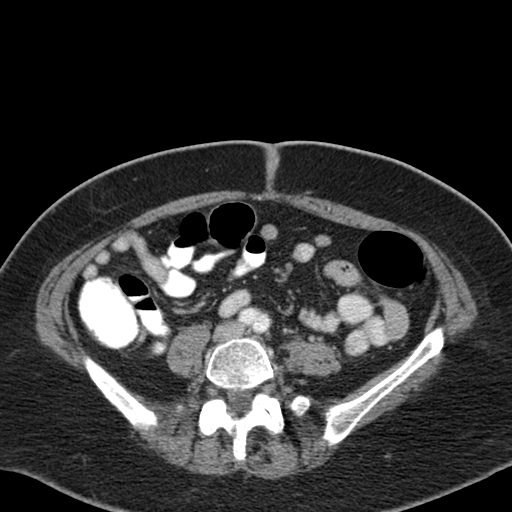
[im 82/163  soft-tissue]
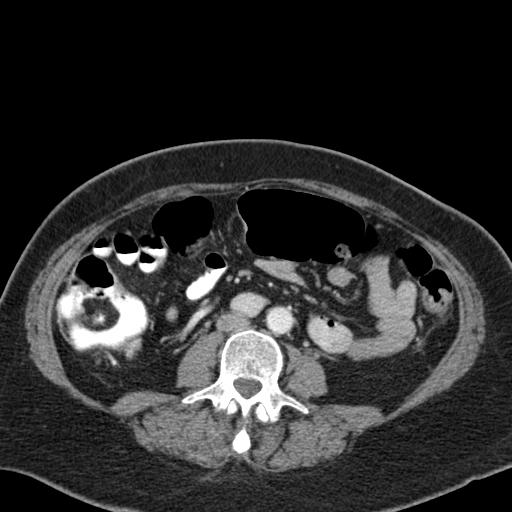
[im 95/163  soft-tissue]
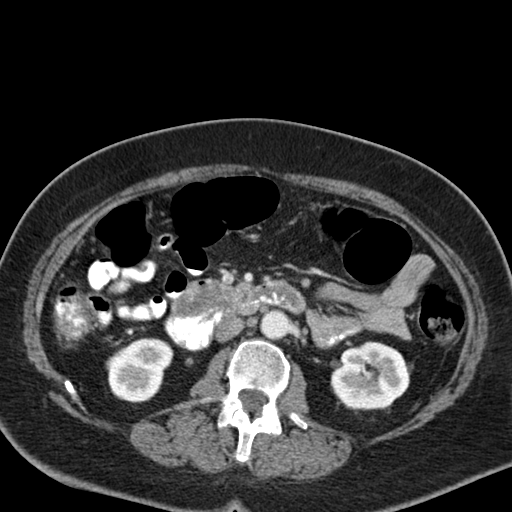
[im 109/163  soft-tissue]
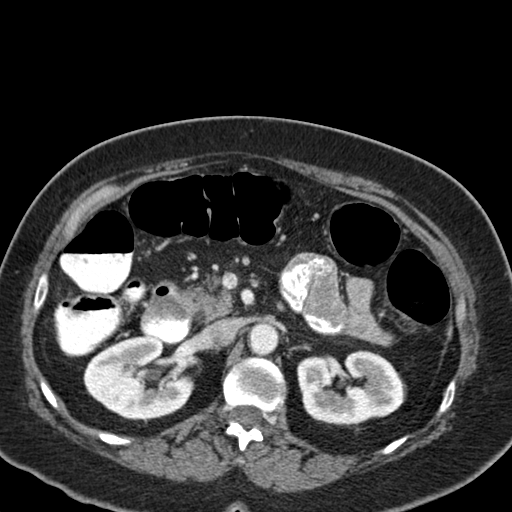
[im 109/163  bone]
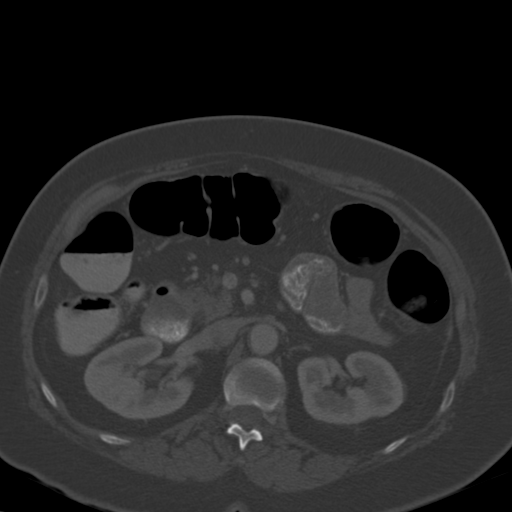
[im 115/163  soft-tissue]
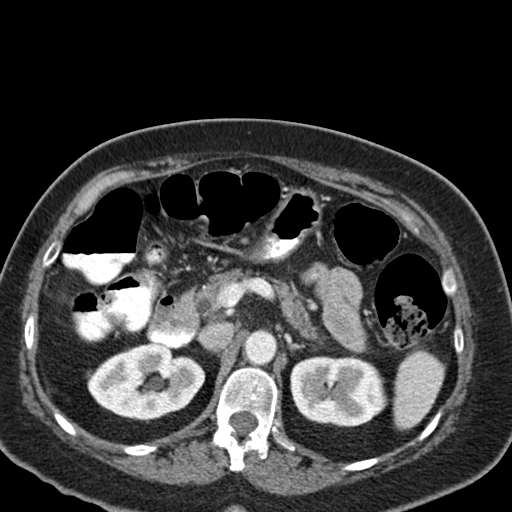
[im 129/163  soft-tissue]
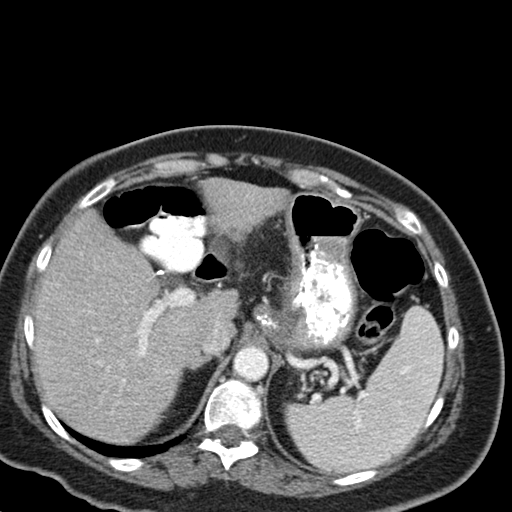
[im 136/163  lung]
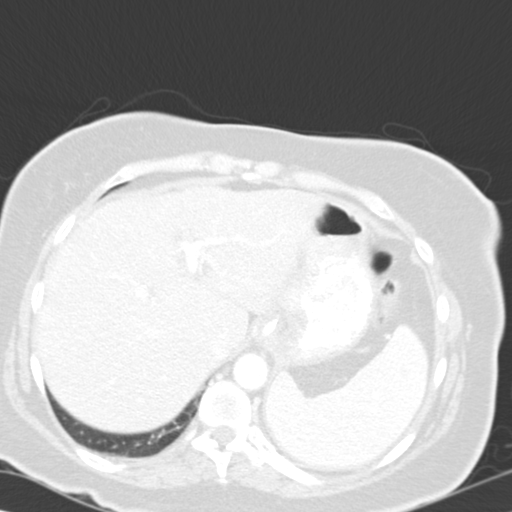
[im 142/163  soft-tissue]
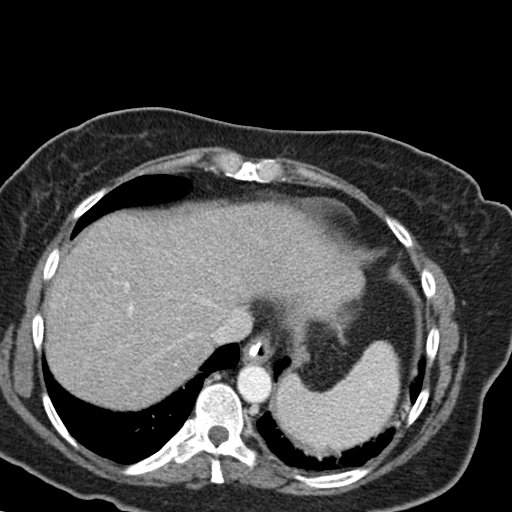
[im 142/163  lung]
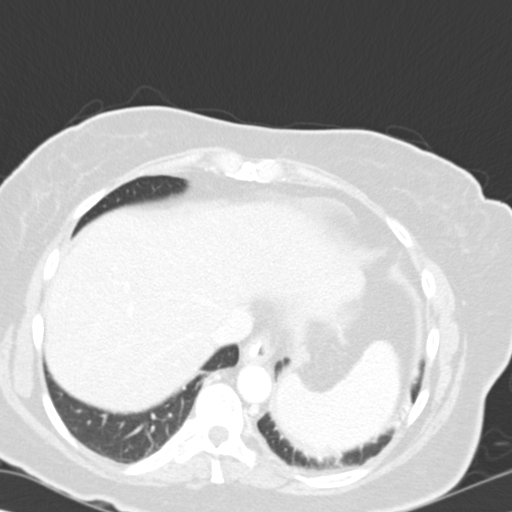
[im 149/163  lung]
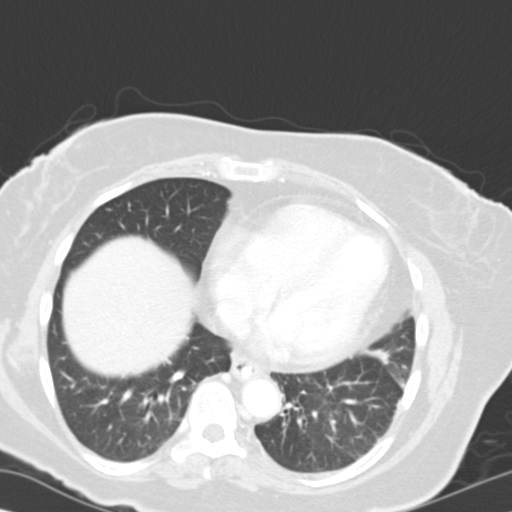
[im 156/163  soft-tissue]
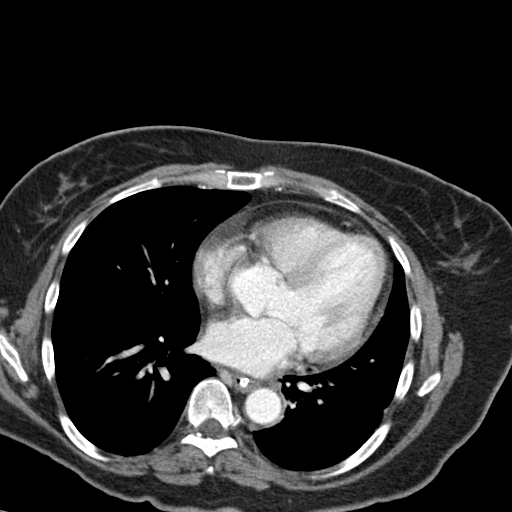
[im 156/163  lung]
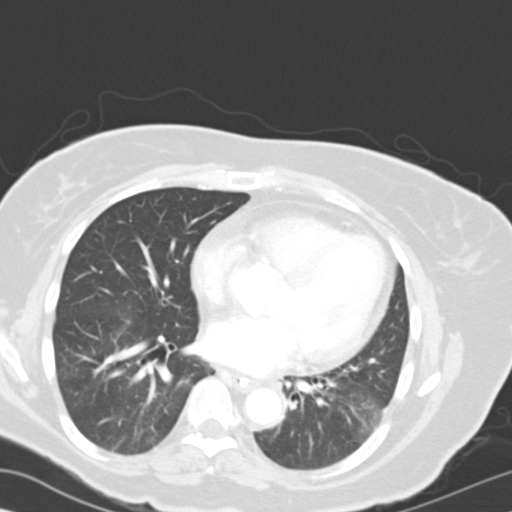

[15 of 32 positions shown; findings below may reference images not displayed]

IMPRESSION: 1. No bowel obstruction, adenopathy, mass or perforation. No acute
inflammatory process evident.

## 2012-08-11 IMAGING — CR DG ABDOMEN 3V
1 series · 6 of 6 positions shown · non-contrast
Comparison: none

REASON FOR EXAM: abd pain, vomiting
COMMENTS:

[Series 1: view not recorded · 0.17mm/px · 6 of 6 slices shown]
[im 1/6]
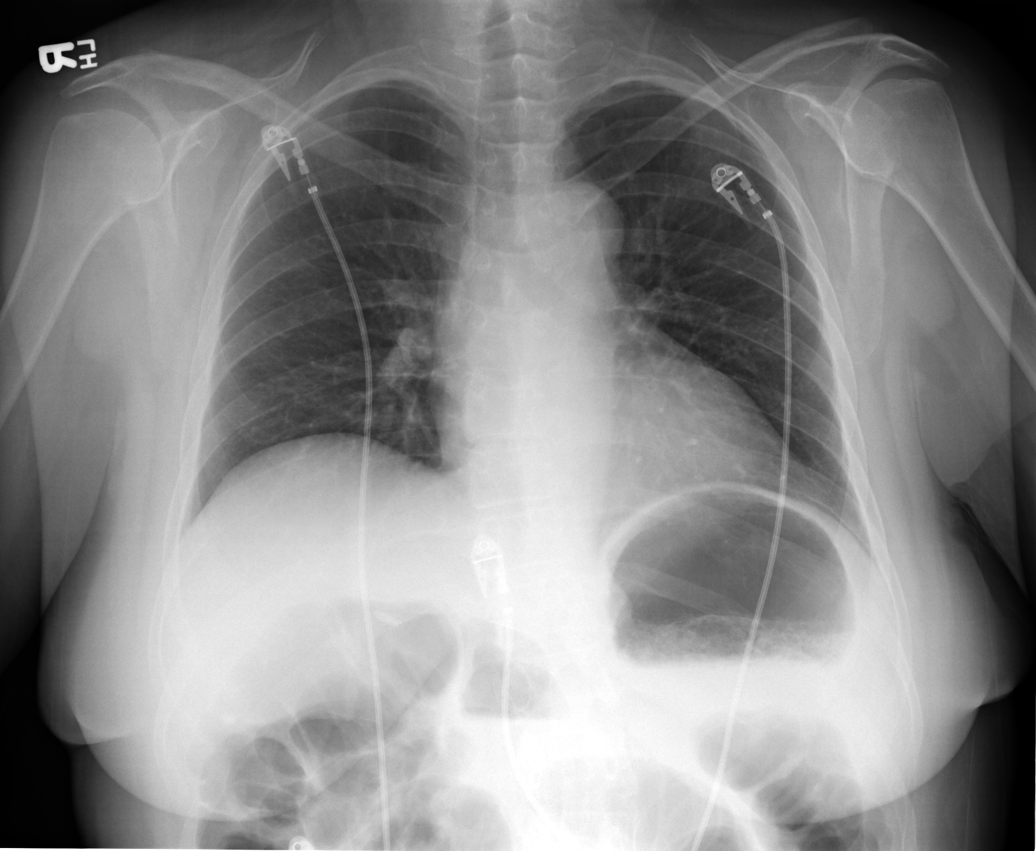
[im 2/6]
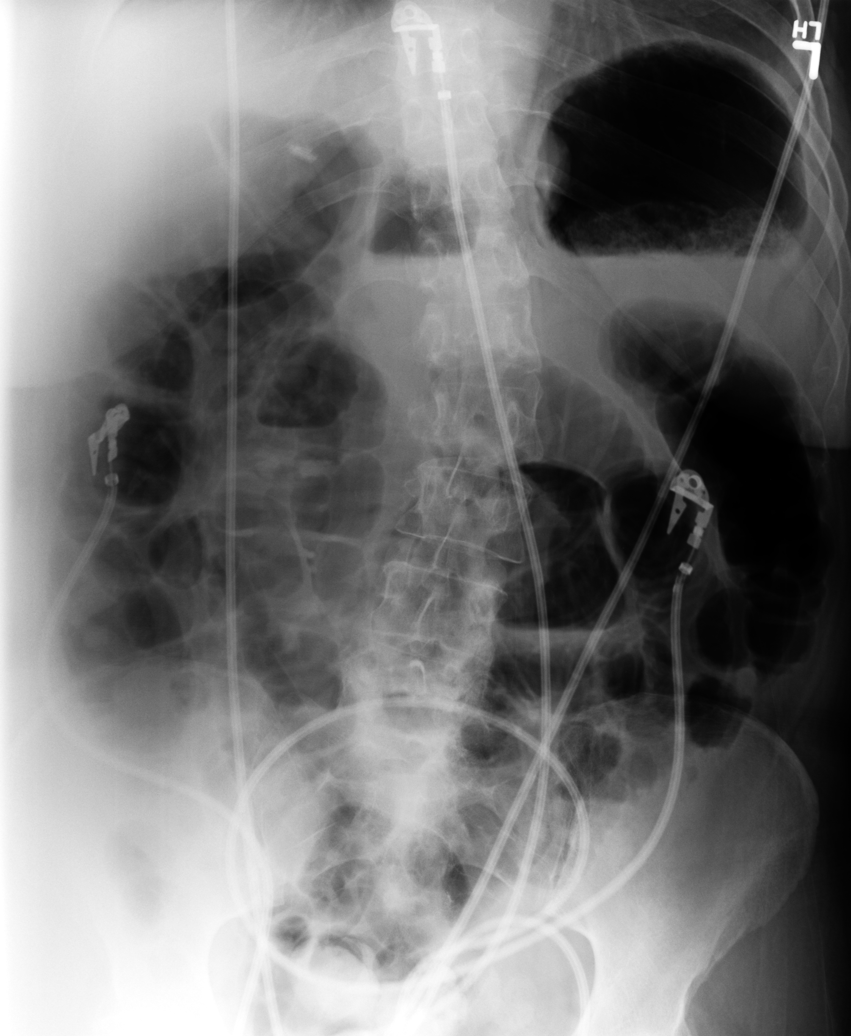
[im 3/6]
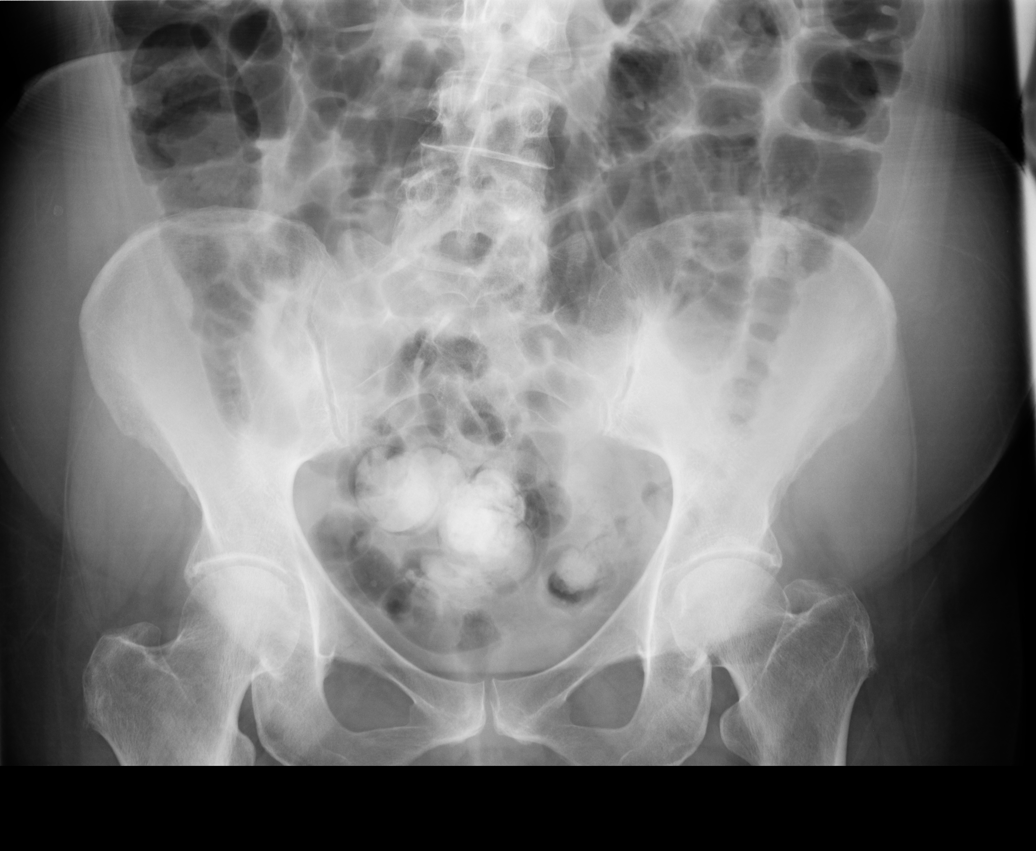
[im 4/6]
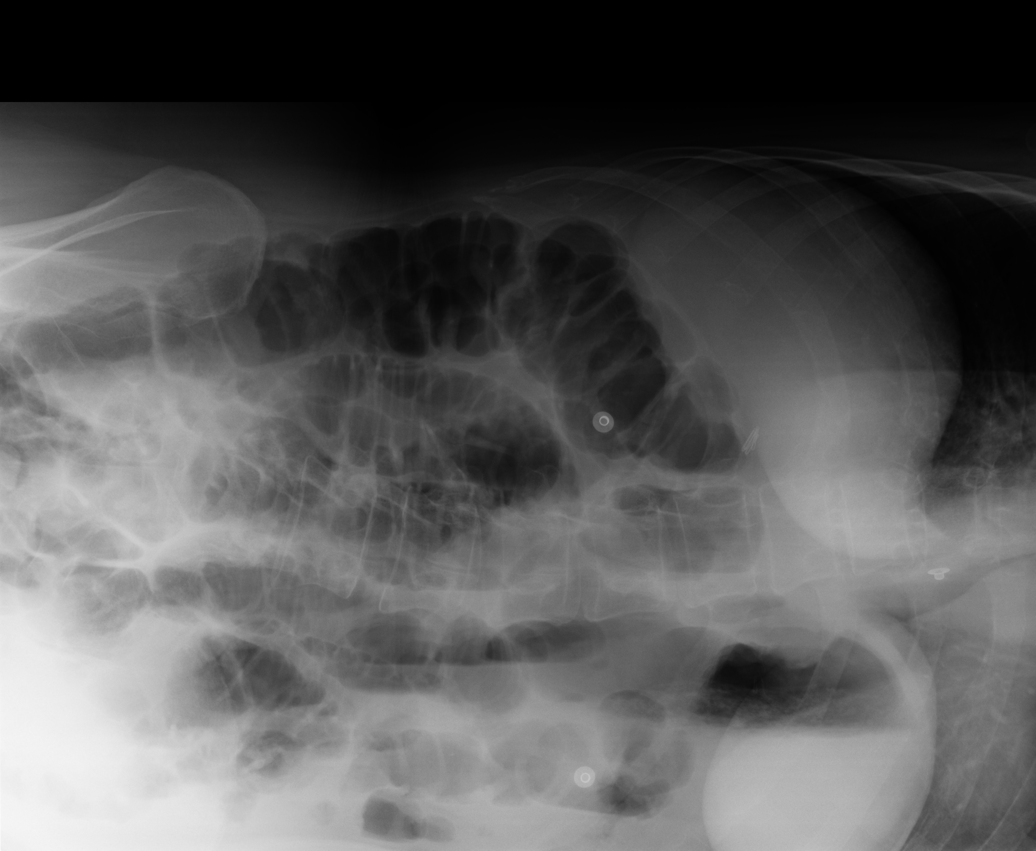
[im 5/6]
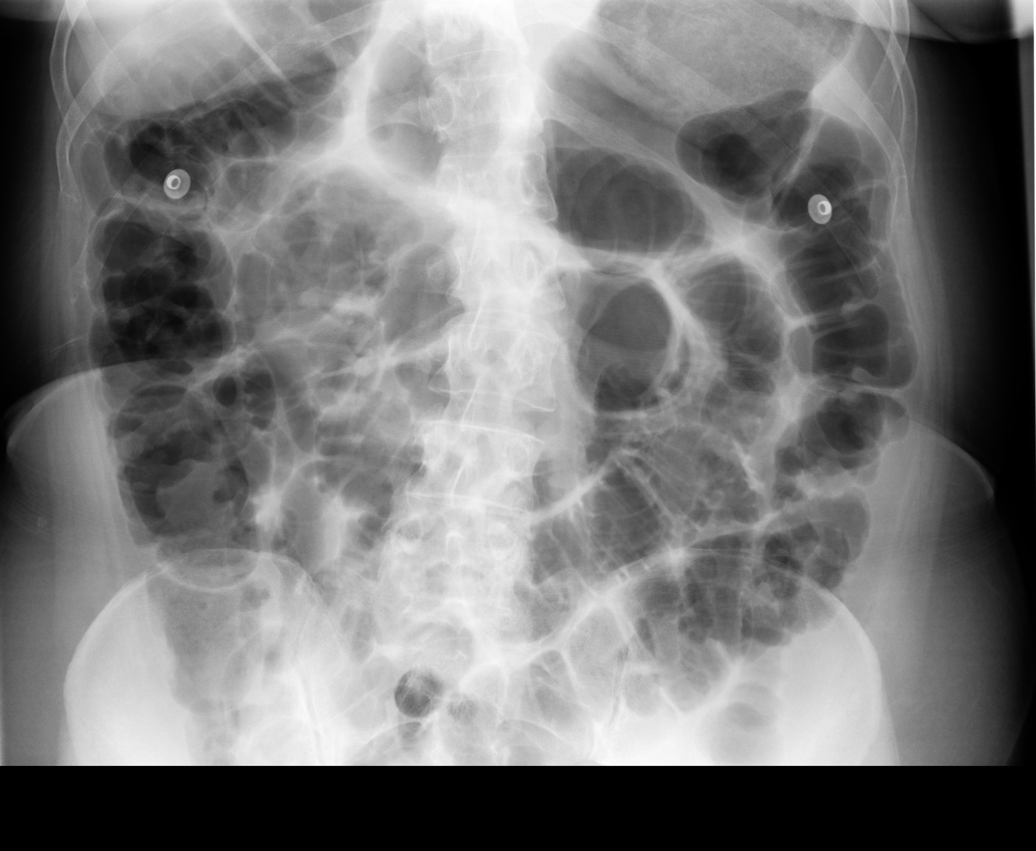
[im 6/6]
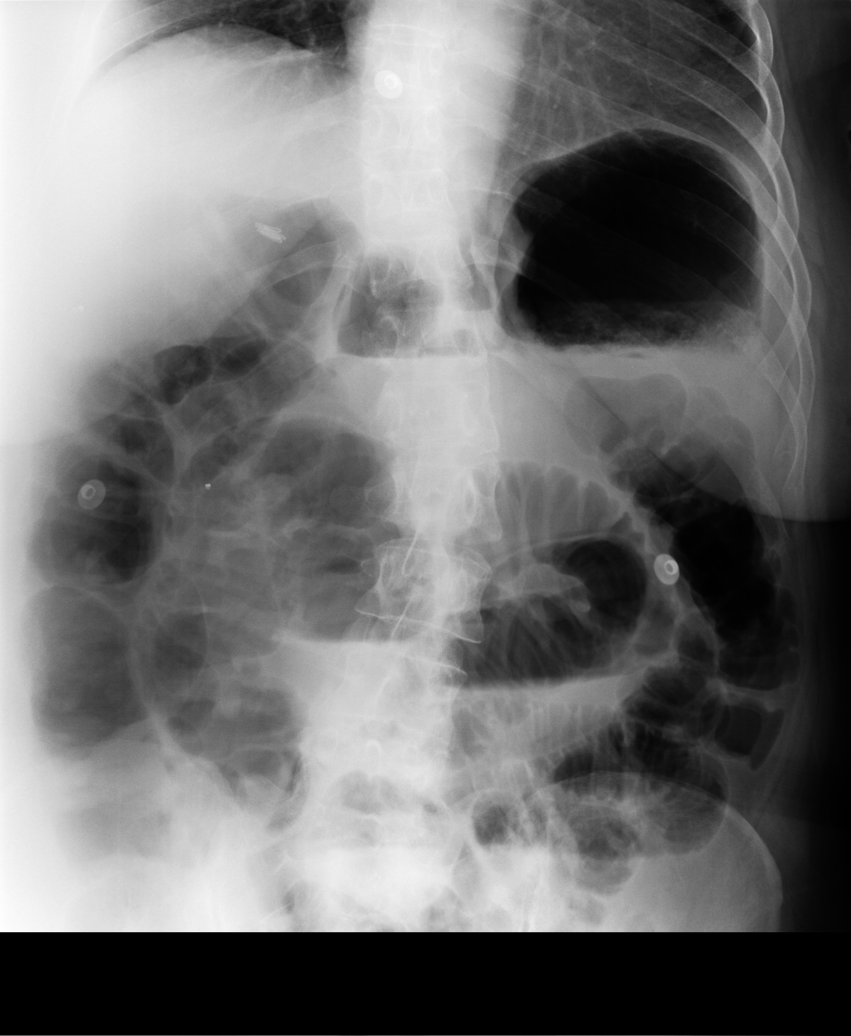

[6 of 6 positions shown; findings below may reference images not displayed]

PROCEDURE:     DXR - DXR ABDOMEN 3-WAY (INCL PA CXR)  - September 18, 2010  [DATE]

RESULT:     Comparison is made to the study of 09/13/2010. There is decreased
amount of air within the stomach. There shallow inspiration. Cardiac
monitoring electrodes are present. There is no evidence of edema,
infiltrate, effusion or pneumothorax. There is mild dilation of loops of
small bowel with air-fluid levels present. Air and fecal material is present
in the colon to the rectum. No free air or pneumatosis is appreciated.
IMPRESSION: Findings suggestive of ileus. Partial small bowel
obstruction is felt to be less likely. No acute cardiopulmonary disease.

## 2012-08-13 IMAGING — CR DG CHEST 2V
1 series · 2 of 2 positions shown · non-contrast
Comparison: none

REASON FOR EXAM: hypoxia, fever
COMMENTS:

[Series 1: view not recorded · 0.17mm/px · 2 of 2 slices shown]
[im 1/2]
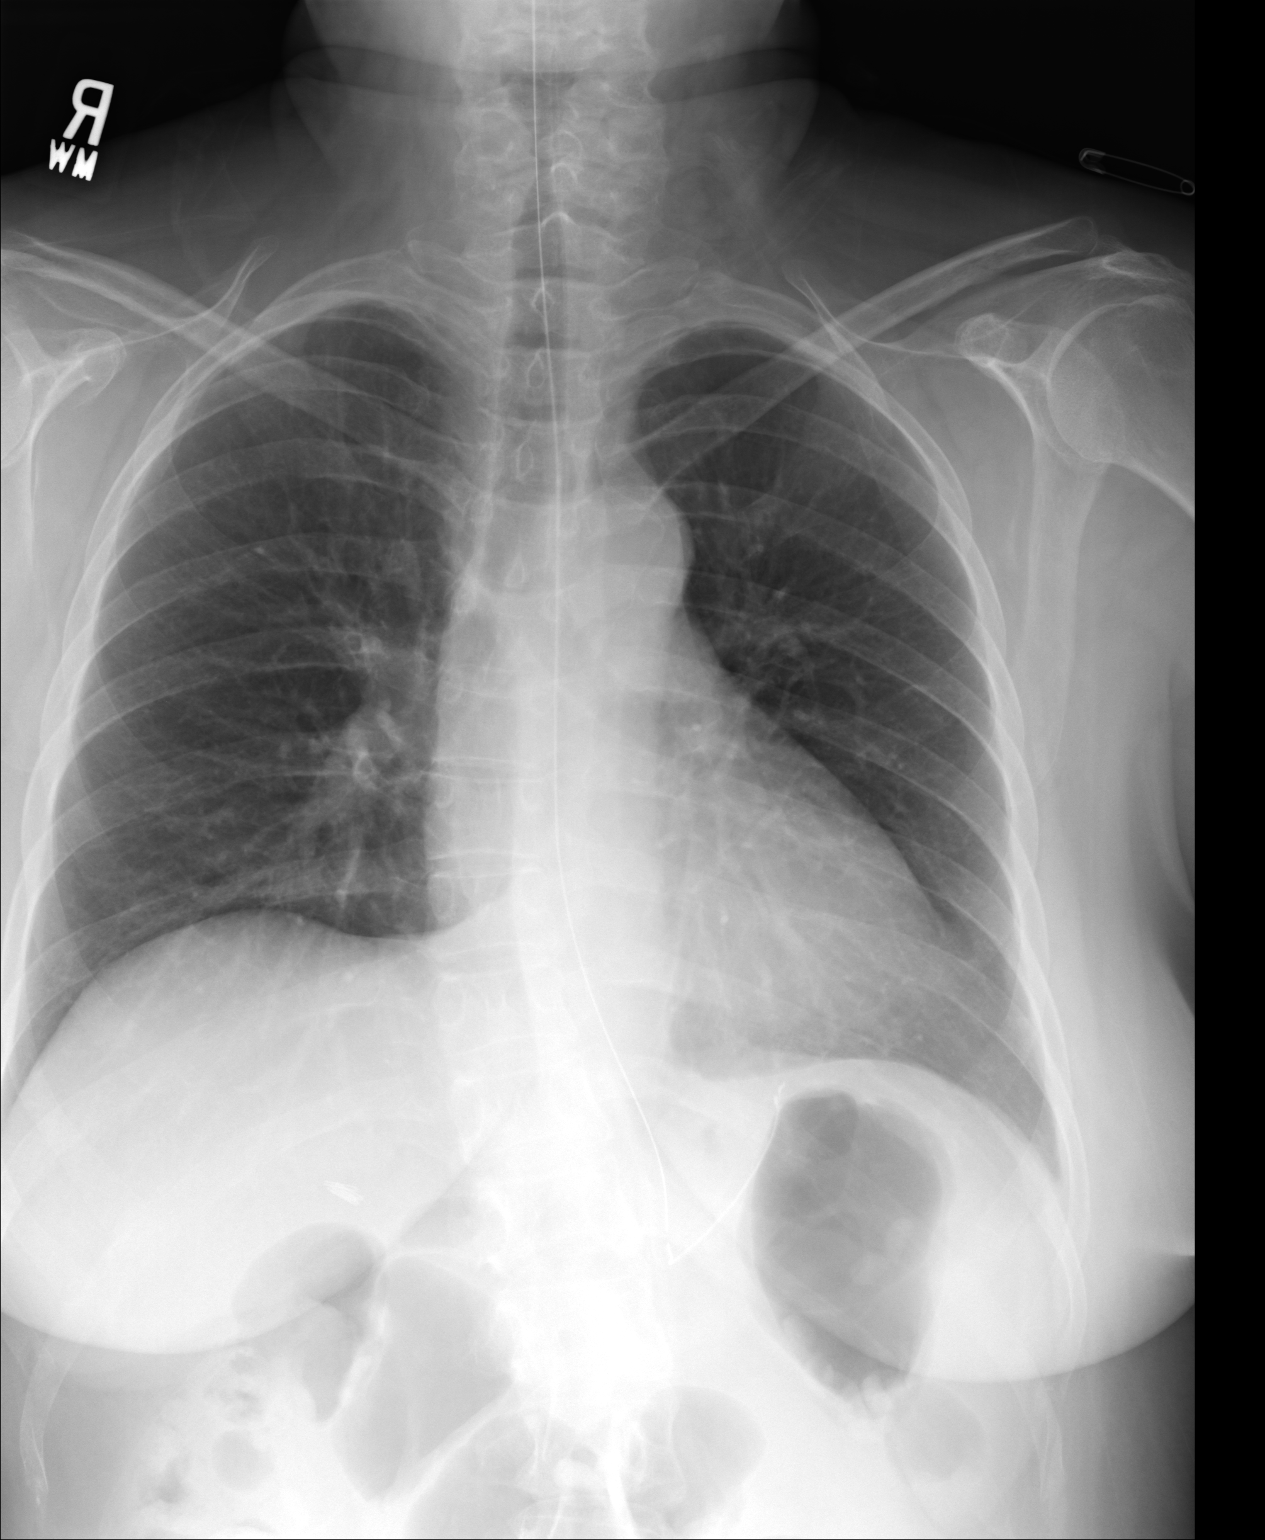
[im 2/2]
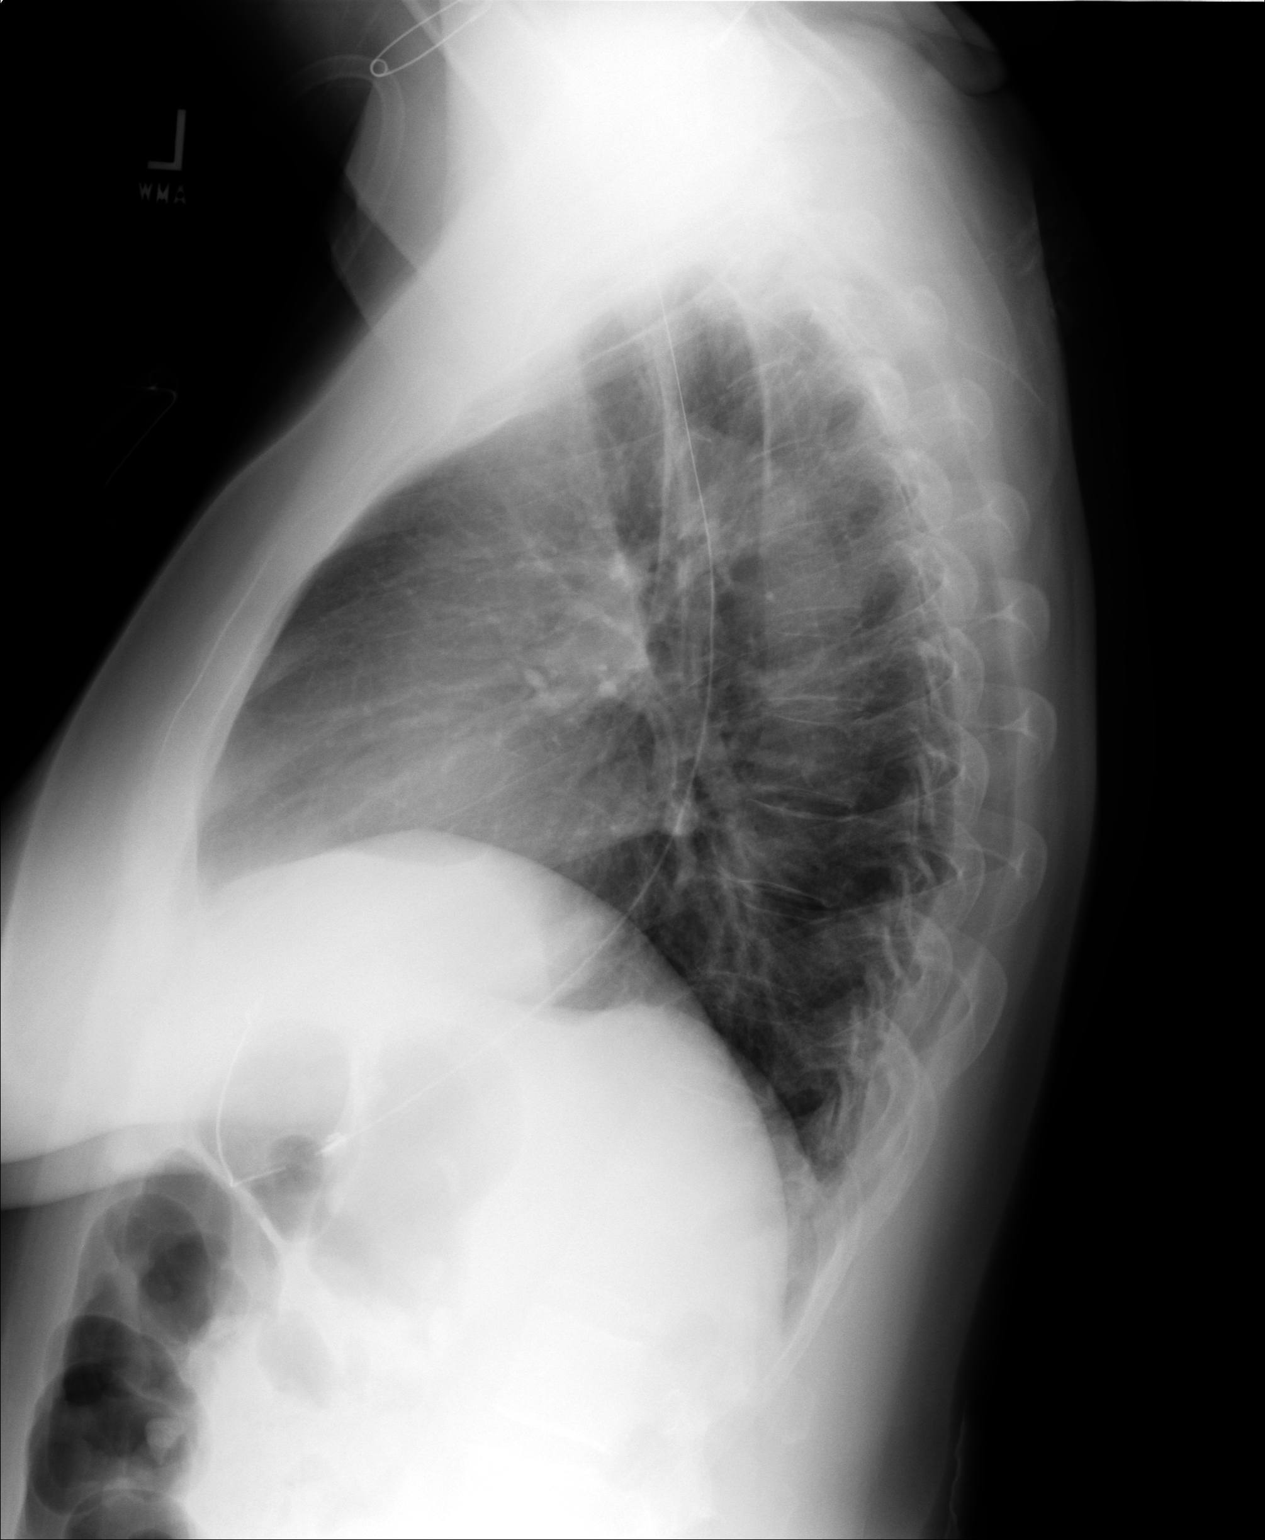

[2 of 2 positions shown; findings below may reference images not displayed]

PROCEDURE:     DXR - DXR CHEST PA (OR AP) AND LATERAL  - September 20, 2010  [DATE]

RESULT:     Comparison is made to the previous exam of 09/06/2010.

The lungs are clear. The heart and pulmonary vessels are normal. The bony
and mediastinal structures are unremarkable. There is no effusion. There is
no pneumothorax or evidence of congestive failure. A nasogastric tube passes
below the diaphragm into the stomach.
IMPRESSION: No acute cardiopulmonary disease.

## 2012-08-13 IMAGING — CR DG ABDOMEN 3V
1 series · 5 of 5 positions shown · non-contrast
Comparison: none

REASON FOR EXAM: possible ileus, please compare with previous films.
COMMENTS:

[Series 1: view not recorded · 0.17mm/px · 5 of 5 slices shown]
[im 1/5]
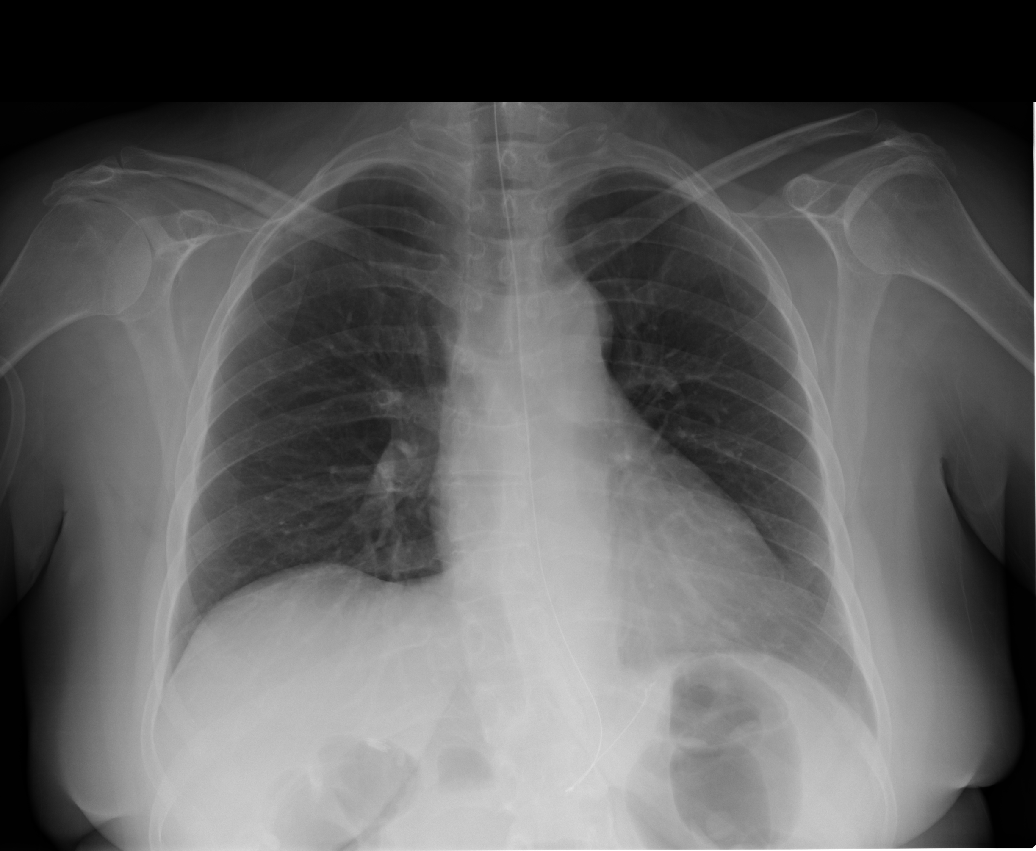
[im 2/5]
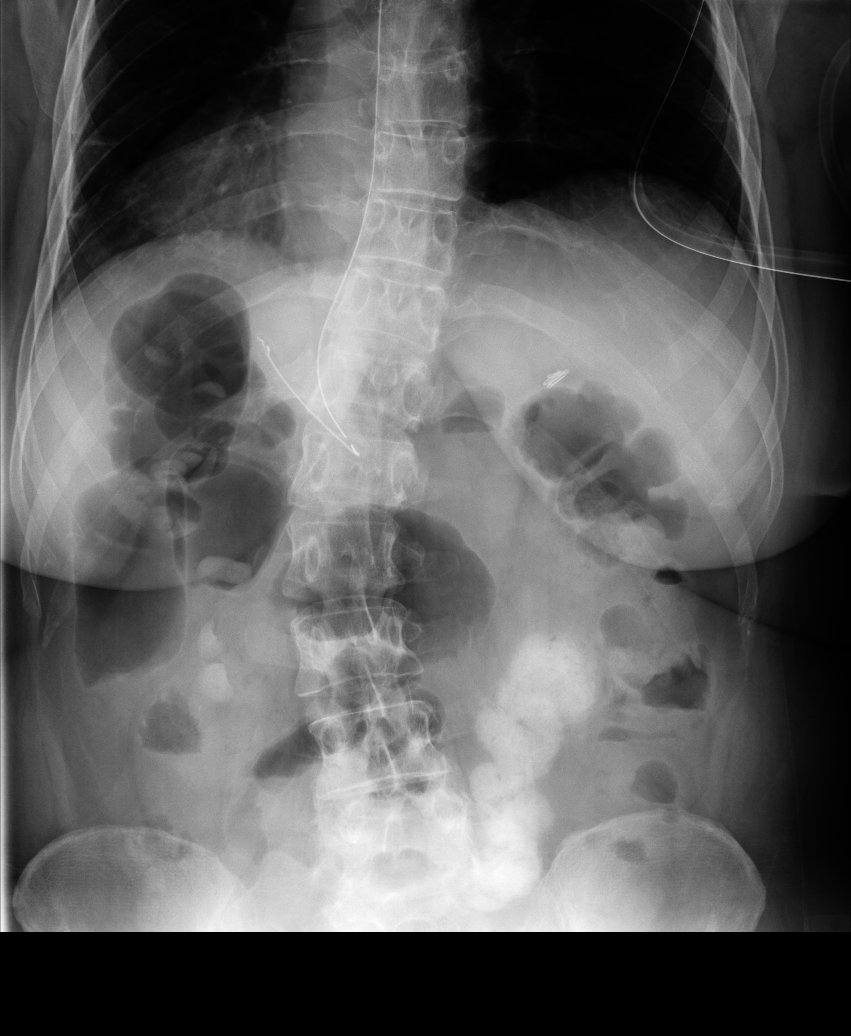
[im 3/5]
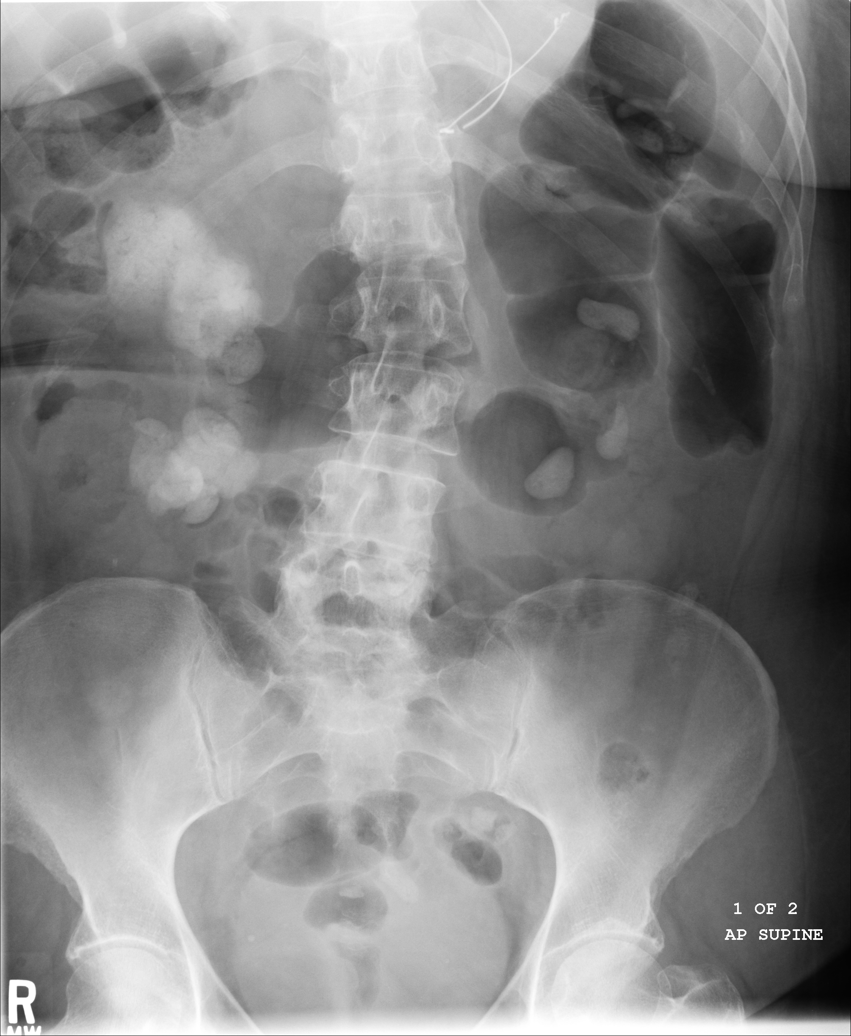
[im 4/5]
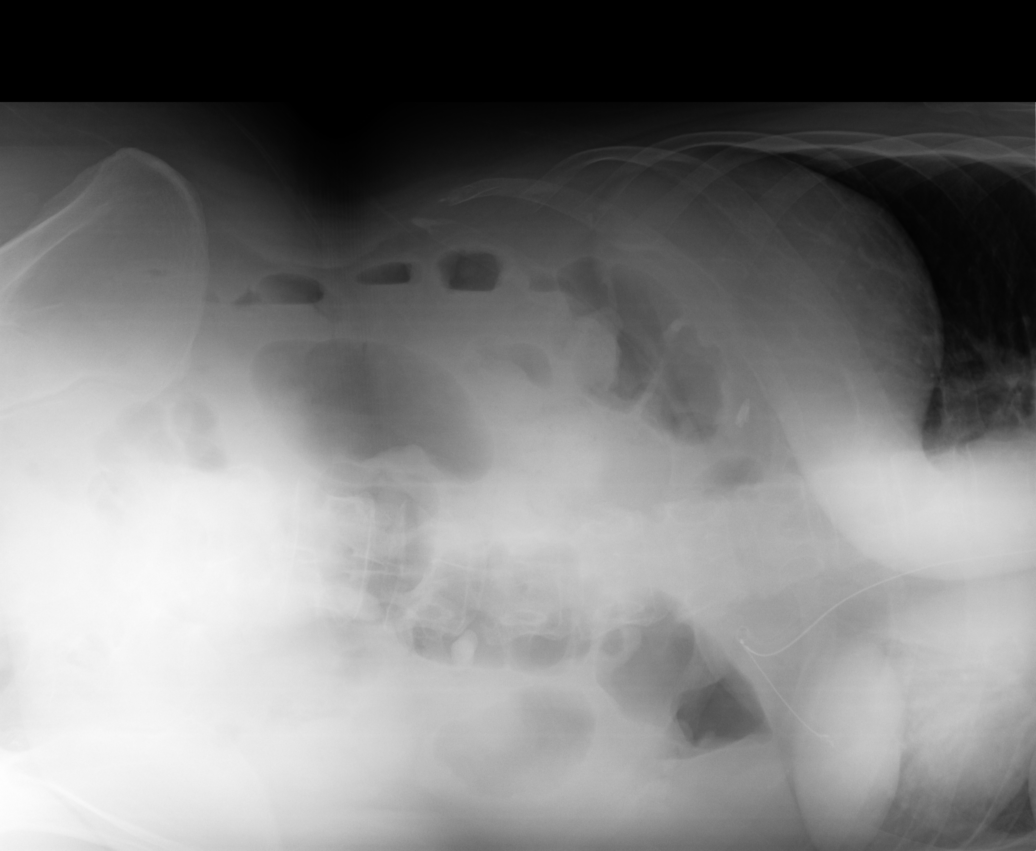
[im 5/5]
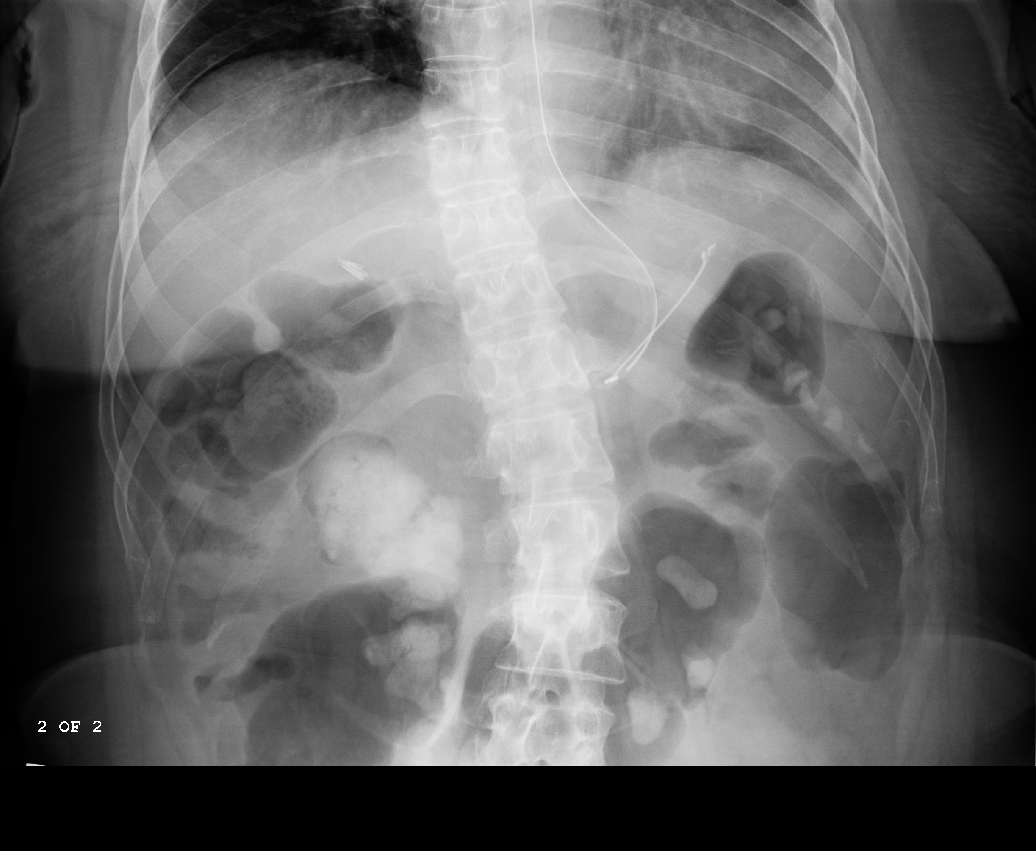

[5 of 5 positions shown; findings below may reference images not displayed]

PROCEDURE:     DXR - DXR ABDOMEN 3-WAY (INCL PA CXR)  - September 20, 2010  [DATE]

RESULT:     Comparison is made to the study of 09/18/2010.

Nasogastric tube projects below the diaphragm into the stomach. The heart is
at the upper limits of normal in size. The lungs are clear. There is air
scattered through loops of colon to the rectum with some residual contrast
in loops of bowel in the right abdomen. There is no definite bowel
obstruction or perforation.

There is a mild scoliotic curvature concave to the right in the upper lumbar
region.
IMPRESSION: 1. No acute cardiopulmonary disease.
2. No evidence of bowel obstruction or perforation.

## 2012-08-28 ENCOUNTER — Other Ambulatory Visit: Payer: Self-pay | Admitting: Family Medicine

## 2012-08-28 DIAGNOSIS — Z780 Asymptomatic menopausal state: Secondary | ICD-10-CM

## 2012-08-28 DIAGNOSIS — Z1231 Encounter for screening mammogram for malignant neoplasm of breast: Secondary | ICD-10-CM

## 2012-09-28 LAB — CK TOTAL AND CKMB (NOT AT ARMC): CK-MB: <0.5 ng/mL — ABNORMAL LOW (ref 0.5–3.6)

## 2012-09-28 LAB — BASIC METABOLIC PANEL
Anion Gap: 11 (ref 7–16)
BUN: 18 mg/dL (ref 7–18)
Calcium, Total: 9.4 mg/dL (ref 8.5–10.1)
Chloride: 102 mmol/L (ref 98–107)
Creatinine: 0.83 mg/dL (ref 0.60–1.30)
EGFR (African American): >60
EGFR (Non-African Amer.): >60
Glucose: 370 mg/dL — ABNORMAL HIGH (ref 65–99)
Osmolality: 287 (ref 275–301)
Potassium: 3.7 mmol/L (ref 3.5–5.1)
Sodium: 135 mmol/L — ABNORMAL LOW (ref 136–145)

## 2012-09-28 LAB — CBC
HCT: 50.5 % — ABNORMAL HIGH (ref 35.0–47.0)
MCV: 90 fL (ref 80–100)
RBC: 5.62 10*6/uL — ABNORMAL HIGH (ref 3.80–5.20)
RDW: 13.9 % (ref 11.5–14.5)
WBC: 9.1 10*3/uL (ref 3.6–11.0)

## 2012-09-29 ENCOUNTER — Inpatient Hospital Stay: Payer: Self-pay | Admitting: Internal Medicine

## 2012-09-29 LAB — URINALYSIS, COMPLETE
Bilirubin,UR: NEGATIVE
Glucose,UR: >=500 mg/dL (ref 0–75)
Leukocyte Esterase: NEGATIVE
Nitrite: NEGATIVE
Ph: 6 (ref 4.5–8.0)
Protein: NEGATIVE
Squamous Epithelial: 1

## 2012-09-29 LAB — HEPATIC FUNCTION PANEL A (ARMC)
Alkaline Phosphatase: 203 U/L — ABNORMAL HIGH (ref 50–136)
Bilirubin, Direct: 0.3 mg/dL — ABNORMAL HIGH (ref 0.00–0.20)
Bilirubin,Total: 1.4 mg/dL — ABNORMAL HIGH (ref 0.2–1.0)
Total Protein: 7.4 g/dL (ref 6.4–8.2)

## 2012-09-29 LAB — LIPASE, BLOOD: Lipase: 199 U/L (ref 73–393)

## 2012-09-29 LAB — TROPONIN I: Troponin-I: <0.02 ng/mL

## 2012-10-01 ENCOUNTER — Other Ambulatory Visit: Payer: Medicare Other

## 2012-10-01 ENCOUNTER — Ambulatory Visit: Payer: Medicare Other

## 2012-10-29 ENCOUNTER — Other Ambulatory Visit: Payer: Medicare Other

## 2012-10-29 ENCOUNTER — Ambulatory Visit: Payer: Medicare Other

## 2012-12-01 ENCOUNTER — Other Ambulatory Visit: Payer: Medicare Other

## 2012-12-01 ENCOUNTER — Ambulatory Visit: Payer: Medicare Other

## 2013-04-04 IMAGING — CT CT ABD-PELV W/ CM
1 of 2 series · 15 of 32 positions shown, 19 images · non-contrast
Comparison: none

REASON FOR EXAM: (1) generalized abdominal pain and nausea; (2)
generalized abdominal pain and na
COMMENTS:

[Series 2: 3mm soft tissue · axial · 0.68mm/px · z∈[-472,+5]mm · 15 of 175 slices shown, 19 images]
[im 8/175  soft-tissue]
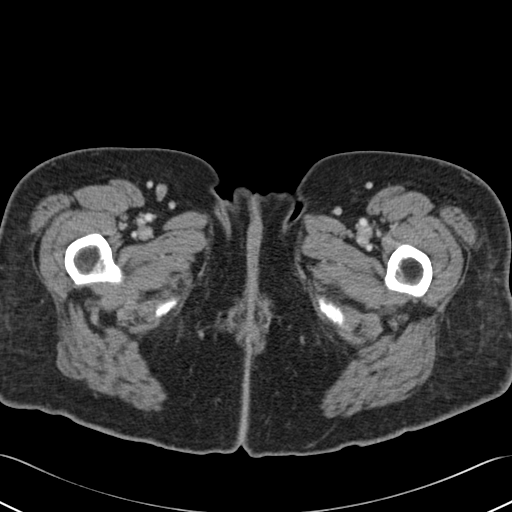
[im 8/175  bone]
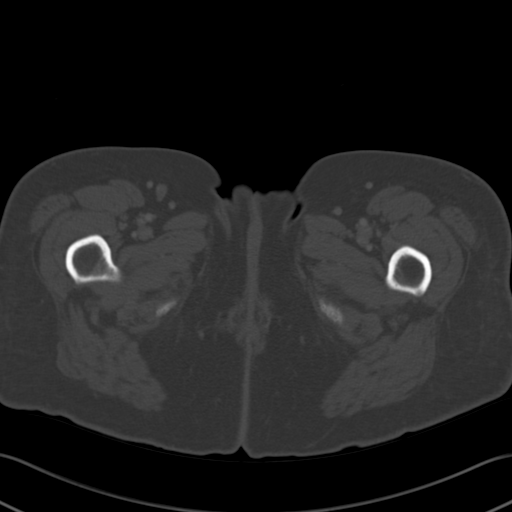
[im 22/175  soft-tissue]
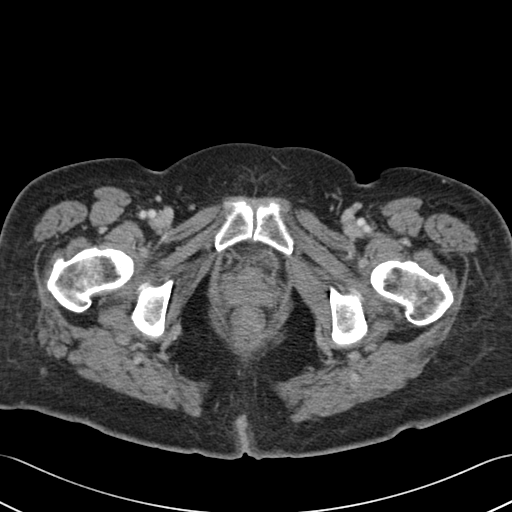
[im 37/175  soft-tissue]
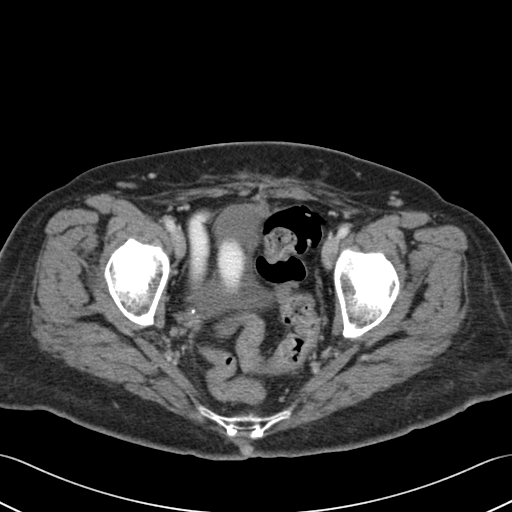
[im 51/175  soft-tissue]
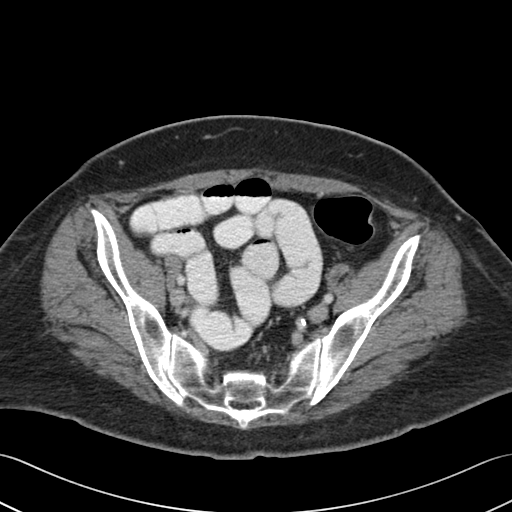
[im 59/175  soft-tissue]
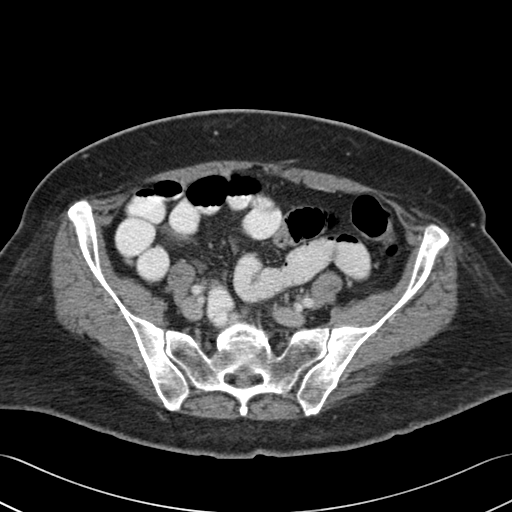
[im 73/175  soft-tissue]
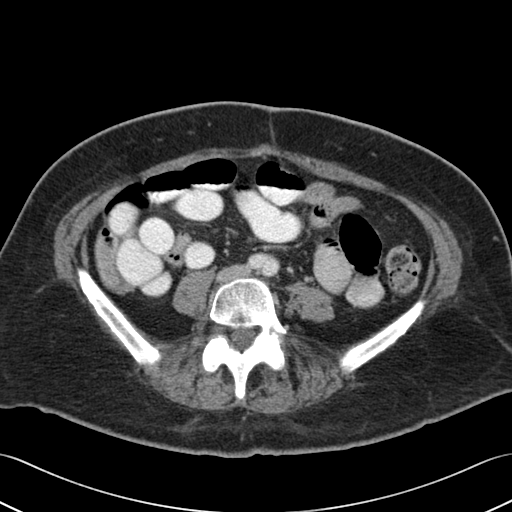
[im 88/175  soft-tissue]
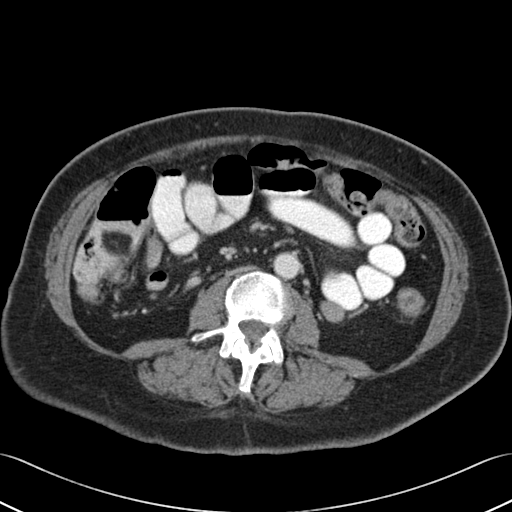
[im 102/175  soft-tissue]
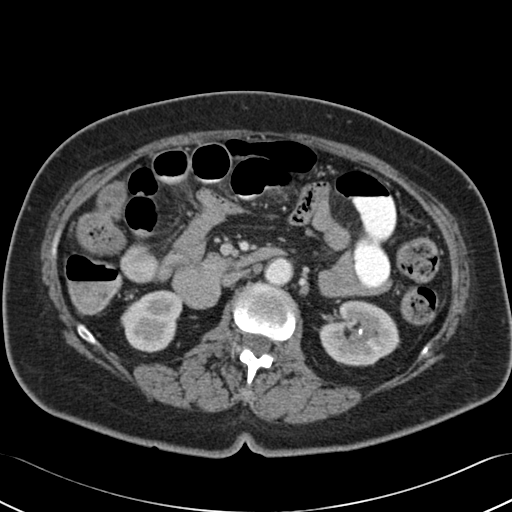
[im 117/175  soft-tissue]
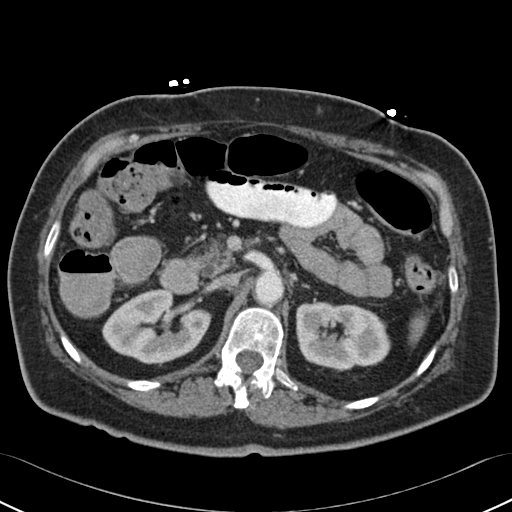
[im 117/175  bone]
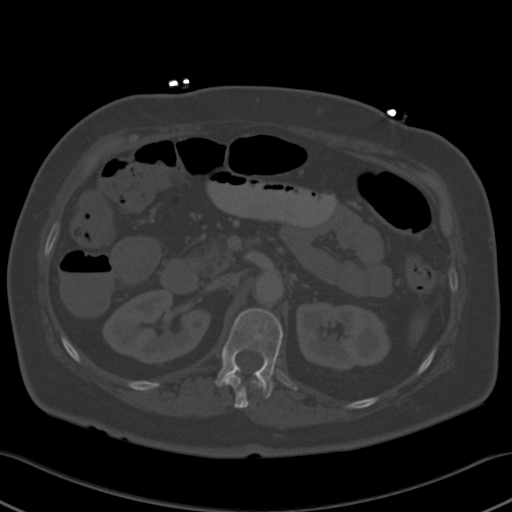
[im 124/175  soft-tissue]
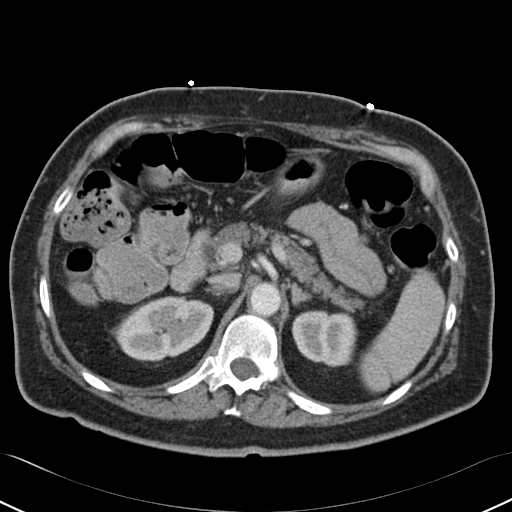
[im 138/175  soft-tissue]
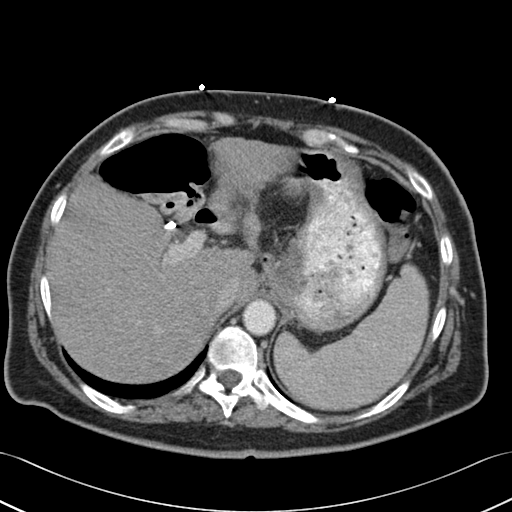
[im 146/175  lung]
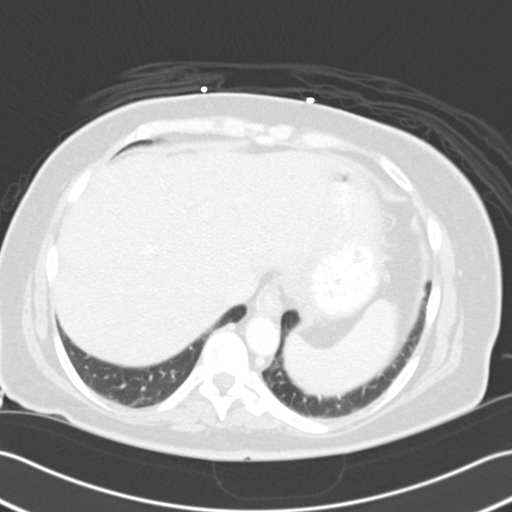
[im 153/175  soft-tissue]
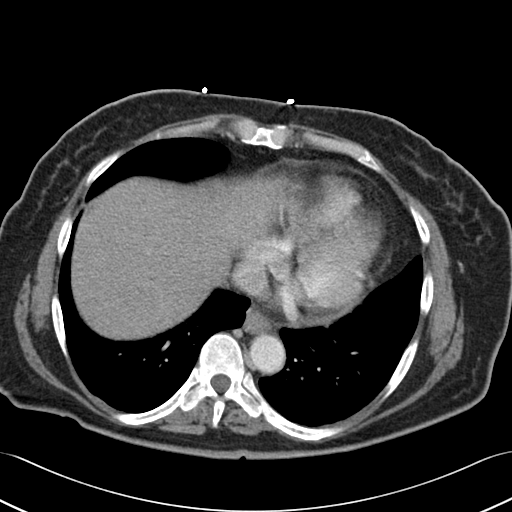
[im 153/175  lung]
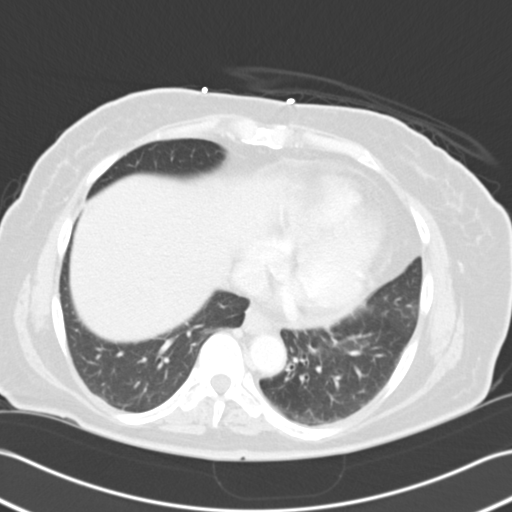
[im 160/175  lung]
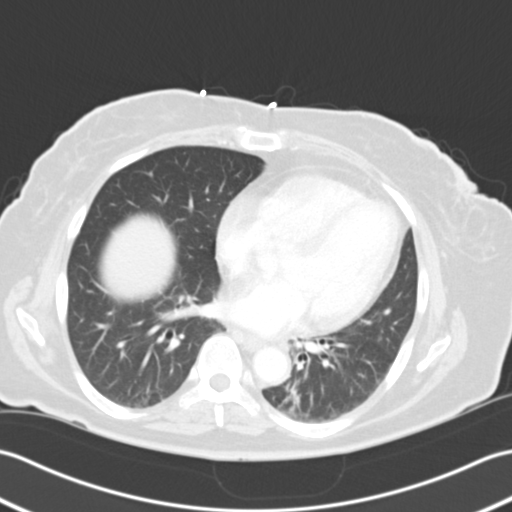
[im 167/175  soft-tissue]
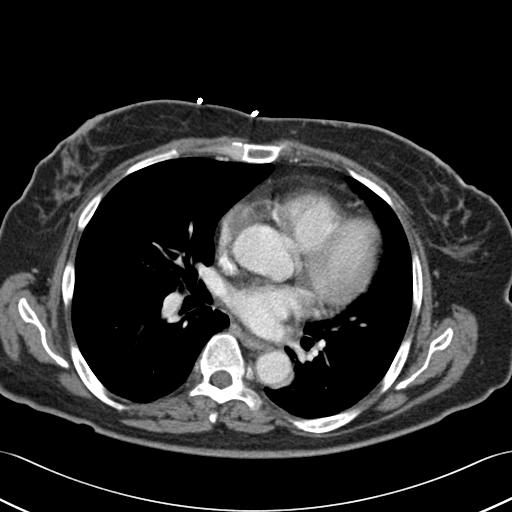
[im 167/175  lung]
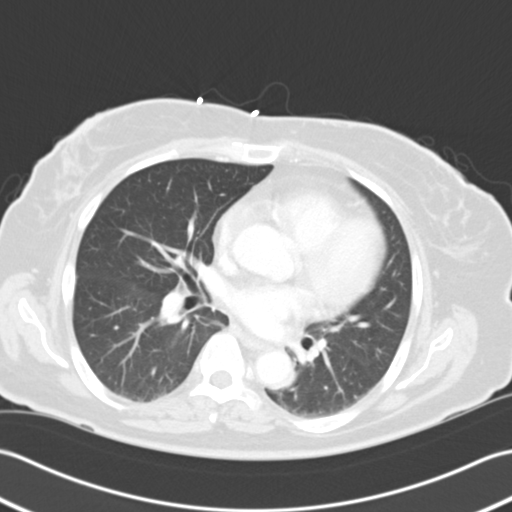

[15 of 32 positions shown; findings below may reference images not displayed]

PROCEDURE:     CT  - CT ABDOMEN / PELVIS  W  - May 12, 2011 [DATE]

RESULT:     CT of the abdomen and pelvis is performed with 100 mL of
1sovue-D6D iodinated intravenous contrast and oral contrast with images
reconstructed at 3.0 mm slice thickness in the axial plane compared to the
previous exam dated 09/13/2010.

There is no evidence of small bowel obstruction. Oral contrast is passed
through the small bowel and shows early opacification of the colon. There
is, however, the less dilated appearance of the distal portion of the ileum
than the remaining portion of the small bowel. The stomach is not distended.
No abnormal colonic distention is present. There are some air-fluid levels
in the slightly prominent loops of small bowel demonstrated. The pancreas is
grossly normal with a small cyst suggested in the body/tail region on image
49. Cholecystectomy clips are present. The spleen is at the upper limits of
normal in size. The kidneys show a nonobstructing lower pole right renal
calculus and nonobstructing lower pole left renal calculus. The aorta is
normal in caliber. The adrenal glands are unremarkable. There is no evidence
of ascites.
IMPRESSION: 1. Nonspecific appearance. No definite obstruction. The mid to proximal
small bowel is somewhat distended. The distal small bowel was not. The
stomach is not distended. A transition point is not identified. Clinical
followup is recommended. Other findings as listed above.

## 2013-04-05 IMAGING — CT CT HEAD WITHOUT CONTRAST
2 series · 16 of 30 positions shown, 20 images · non-contrast
Comparison: none

REASON FOR EXAM: left sided weakness
COMMENTS:

[Series 2: without · axial · non-contrast · 0.43mm/px · z∈[+278,+403]mm · 13 of 31 slices shown, 17 images]
[im 3/31  brain]
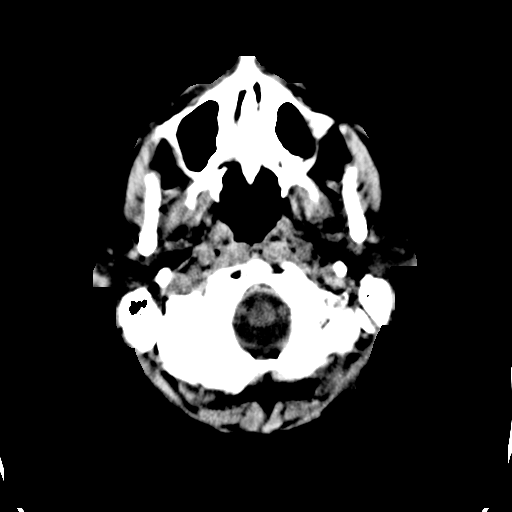
[im 3/31  bone]
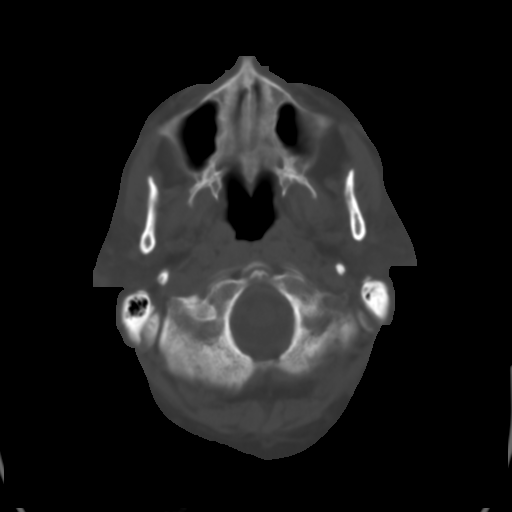
[im 5/31  brain]
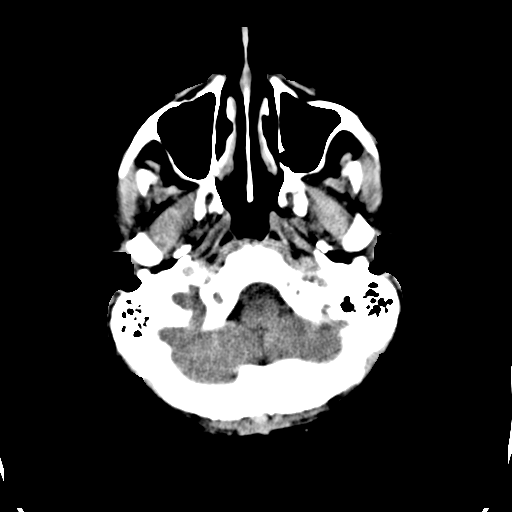
[im 7/31  brain]
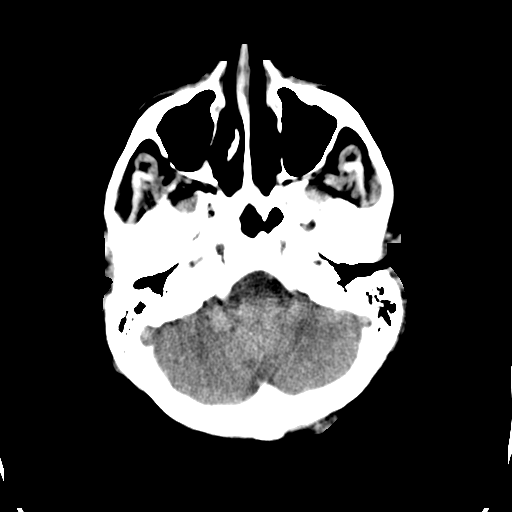
[im 9/31  brain]
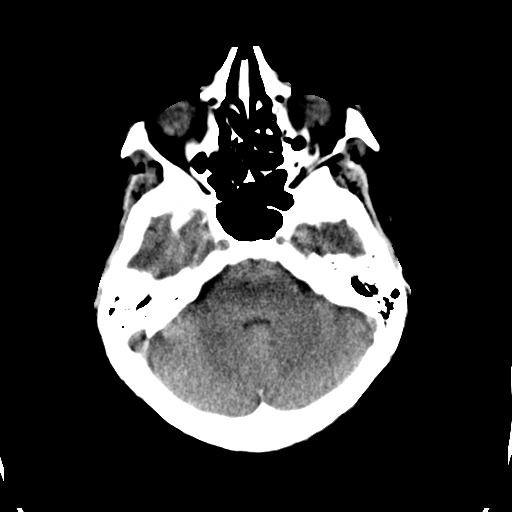
[im 11/31  brain]
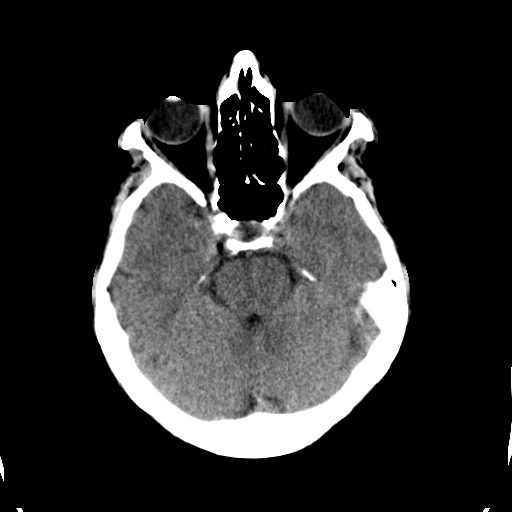
[im 11/31  bone]
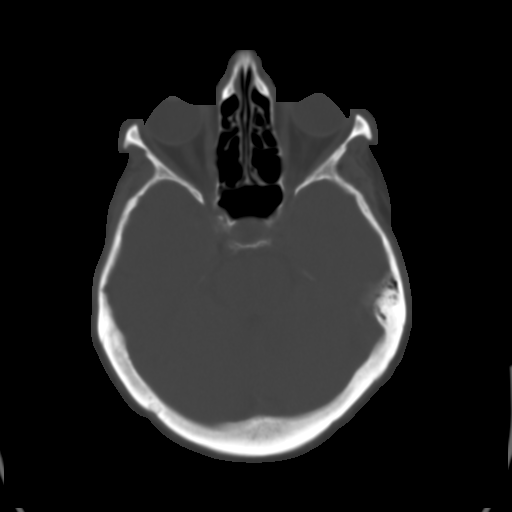
[im 13/31  brain]
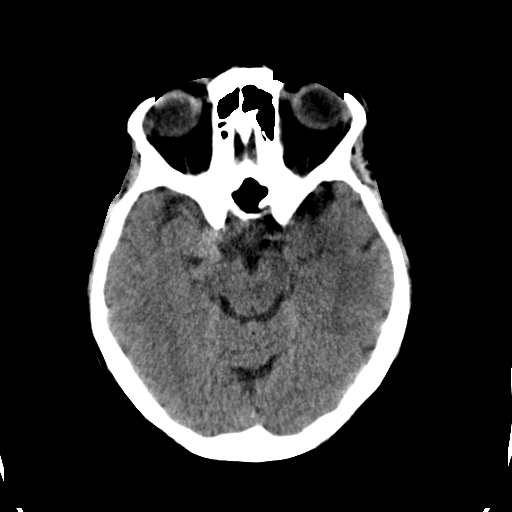
[im 16/31  brain]
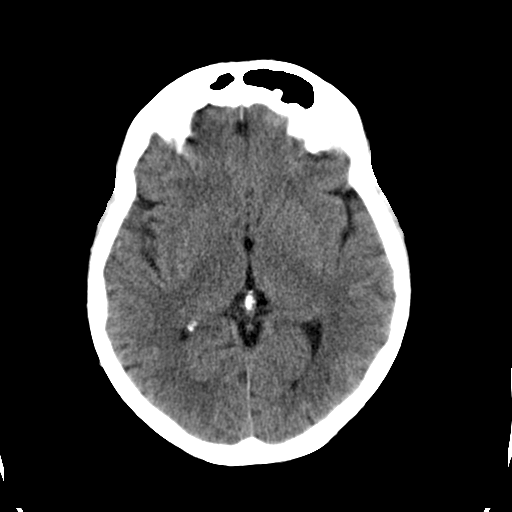
[im 18/31  brain]
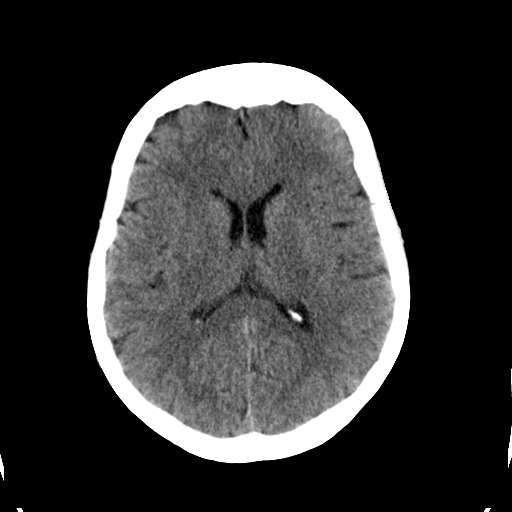
[im 20/31  brain]
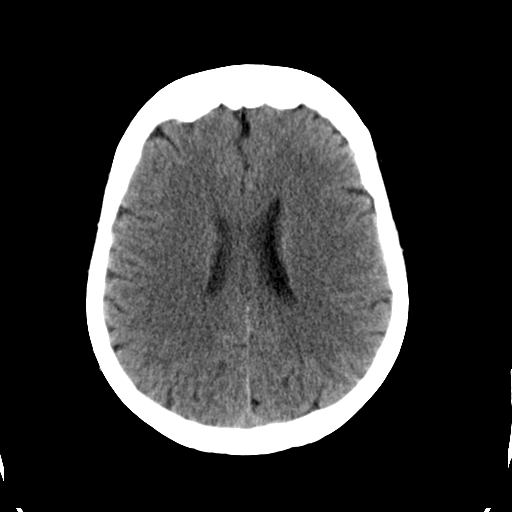
[im 20/31  bone]
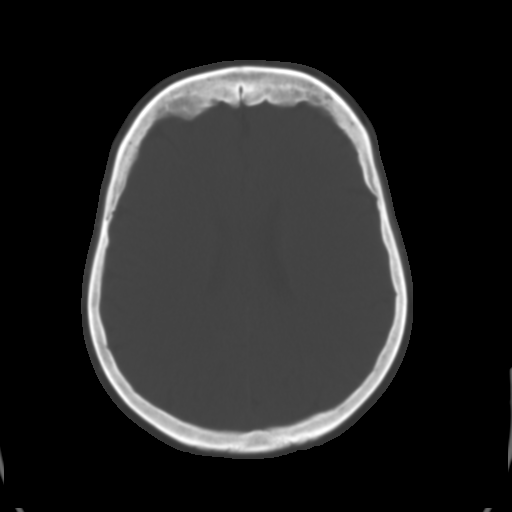
[im 22/31  brain]
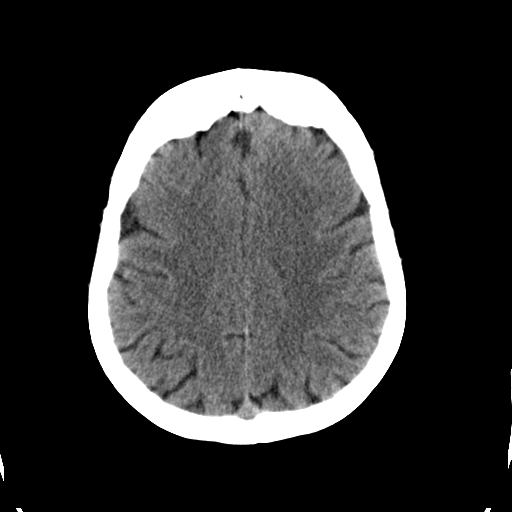
[im 24/31  brain]
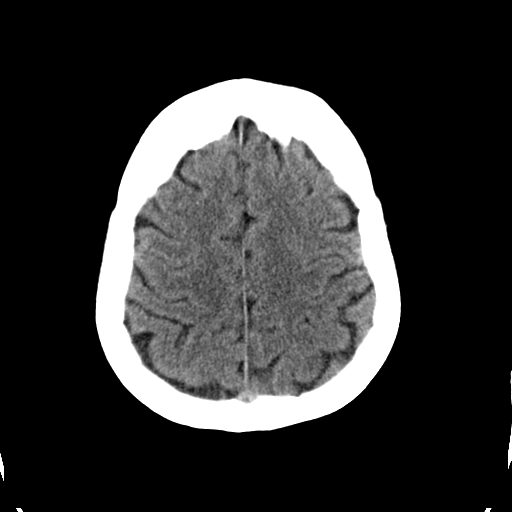
[im 26/31  brain]
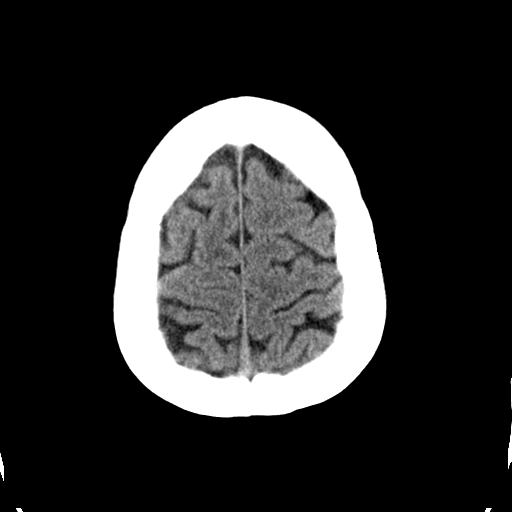
[im 28/31  brain]
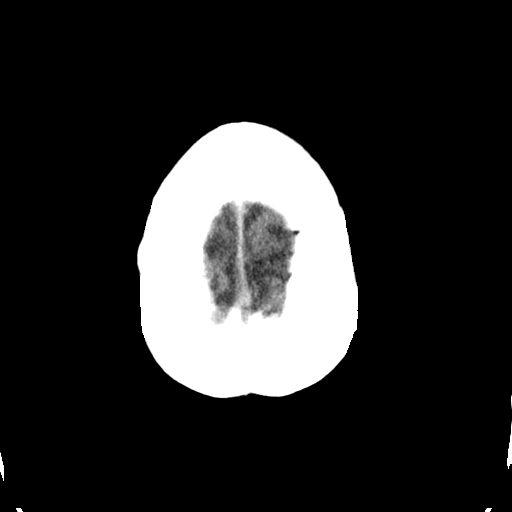
[im 28/31  bone]
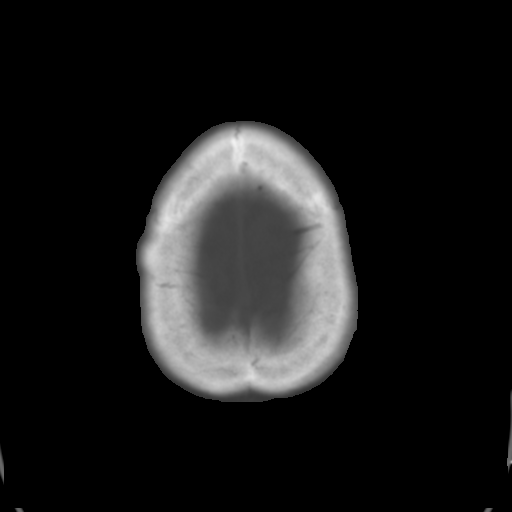

[Series 3: bone · axial · 0.43mm/px · z∈[+278,+318]mm · 3 of 31 slices shown]
[im 3/31  bone]
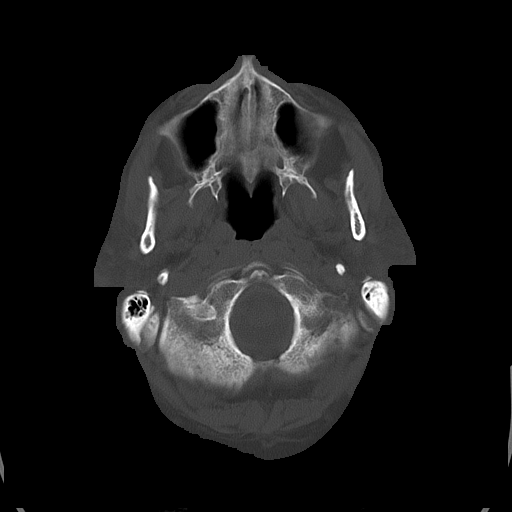
[im 7/31  bone]
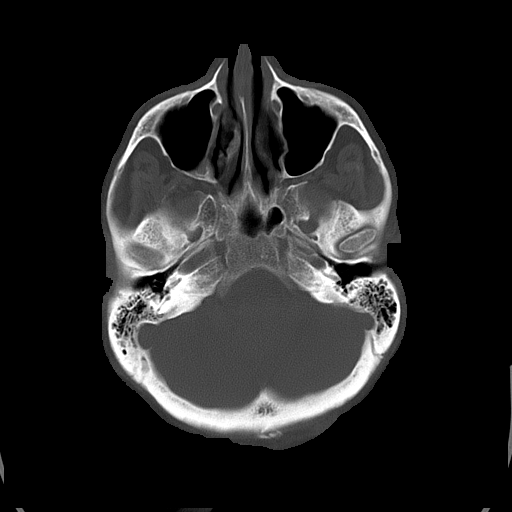
[im 11/31  bone]
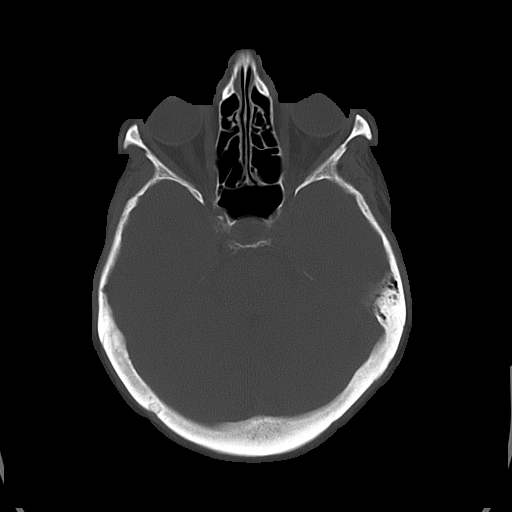

[16 of 30 positions shown; findings below may reference images not displayed]

PROCEDURE:     CT  - CT HEAD WITHOUT CONTRAST  - May 13, 2011  [DATE]

RESULT:     Emergent noncontrast CT of the brain is compared to the most
recent study of 02/05/2010.

The ventricles and sulci are normal. There is no hemorrhage. There is no
focal mass, mass-effect or midline shift. There is no evidence of edema or
territorial infarct. The bone windows demonstrate normal aeration of the
paranasal sinuses and mastoid air cells. There is no skull fracture
demonstrated.
IMPRESSION: 1. No acute intracranial abnormality. Stable appearance.

## 2013-04-06 IMAGING — CR DG CHEST 2V
1 series · 2 of 2 positions shown · non-contrast
Comparison: none

REASON FOR EXAM: left sided crackles at base
COMMENTS:

PROCEDURE:     DXR - DXR CHEST PA (OR AP) AND LATERAL  - May 14, 2011  [DATE]
RESULT:     Comparison: 09/20/2010

[Series 1: view not recorded · 0.17mm/px · 2 of 2 slices shown]
[im 1/2]
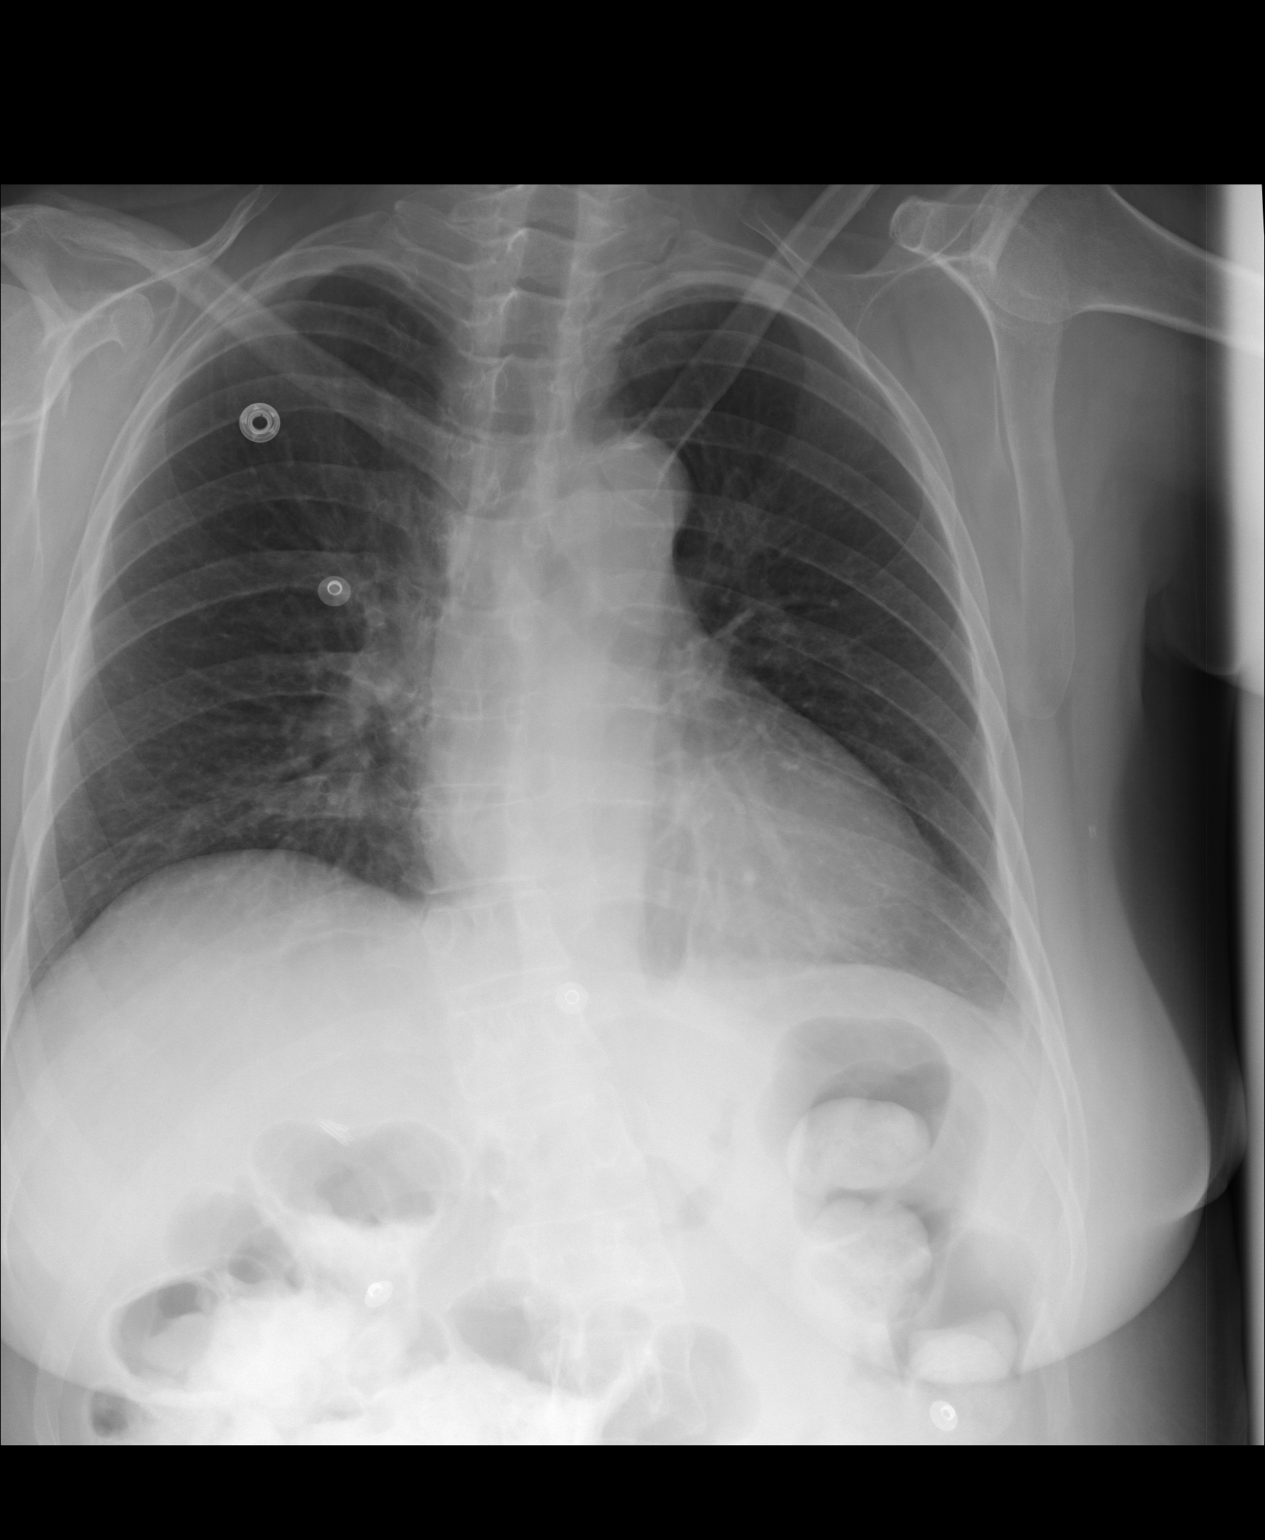
[im 2/2]
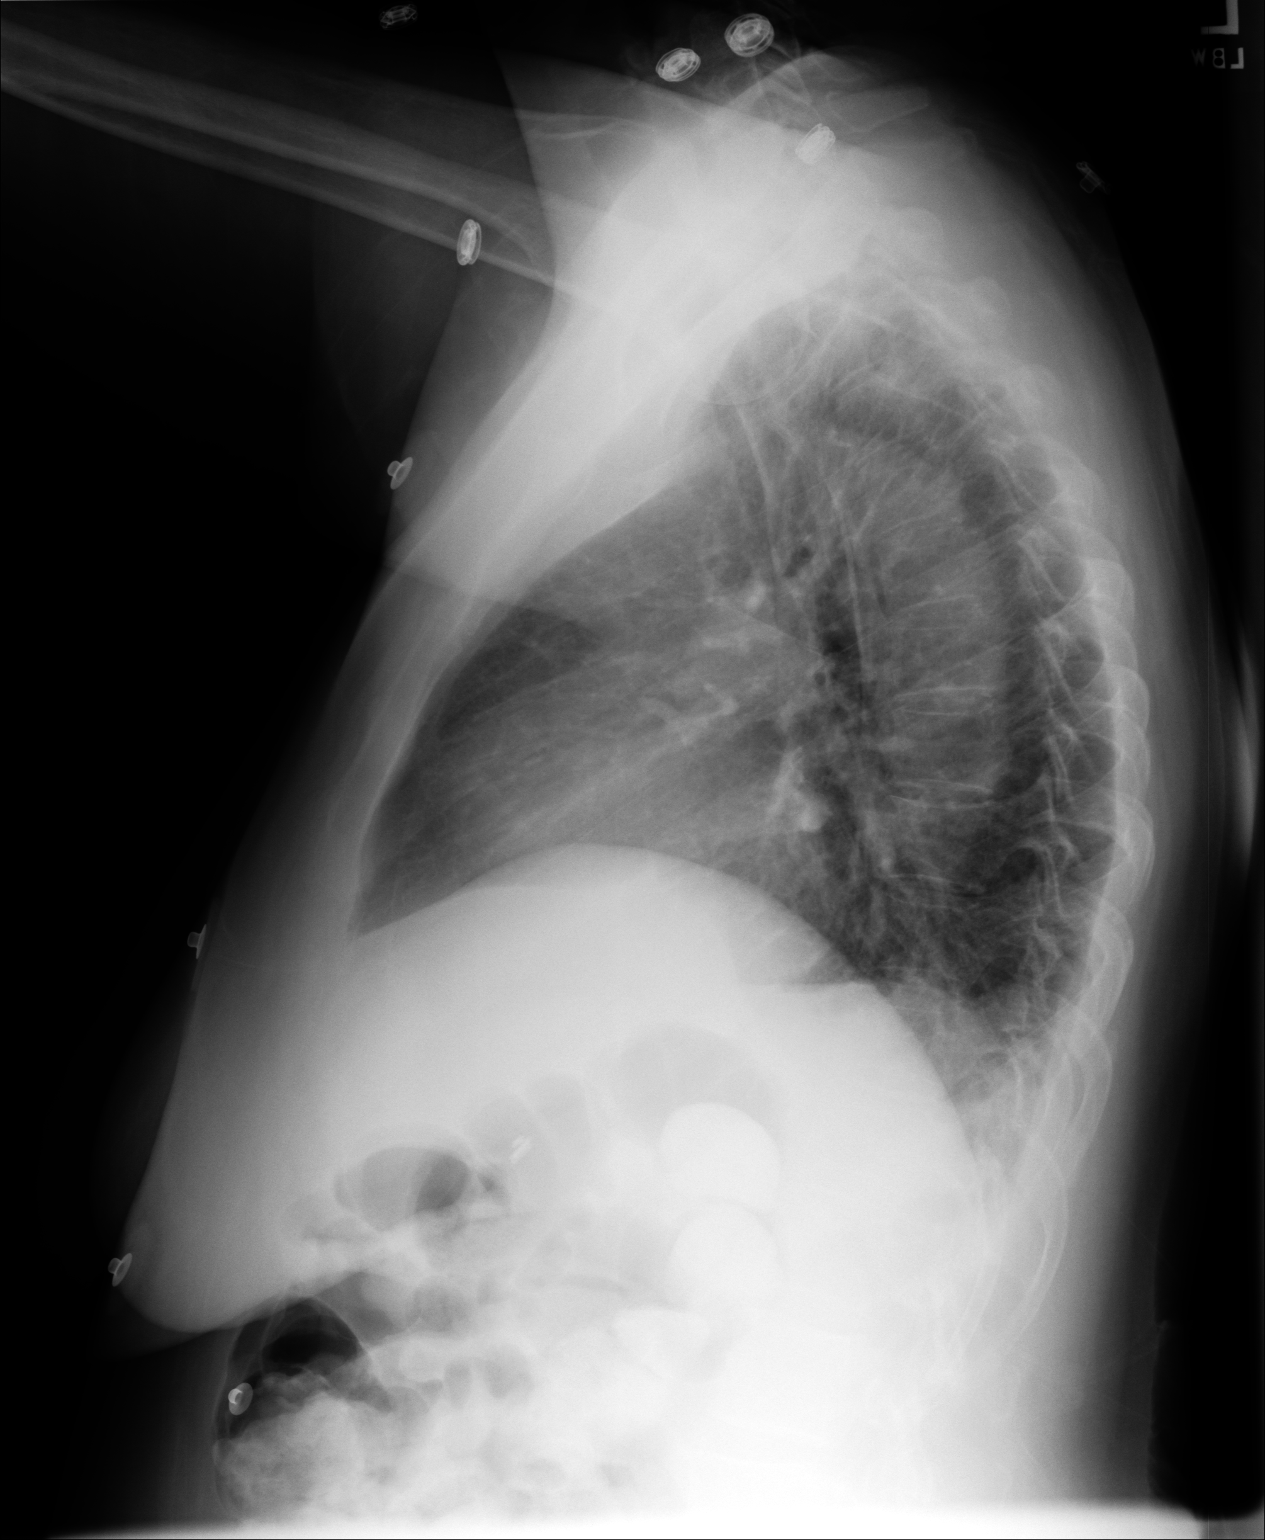

[2 of 2 positions shown; findings below may reference images not displayed]

FINDINGS: PA and lateral chest radiographs are provided.  There is no focal
parenchymal opacity, pleural effusion, or pneumothorax. The heart and
mediastinum are unremarkable.  The osseous structures are unremarkable.
IMPRESSION: No acute disease of the chest.

## 2013-04-06 IMAGING — CR DG ABDOMEN 1V
1 series · 2 of 2 positions shown · non-contrast
Comparison: none

REASON FOR EXAM: follwo up small bowel distension on ct at admission
COMMENTS:

PROCEDURE:     DXR - DXR KIDNEY URETER BLADDER  - May 14, 2011  [DATE]
RESULT:     Comparisons:  None

[Series 1: view not recorded · 0.17mm/px · 2 of 2 slices shown]
[im 1/2]
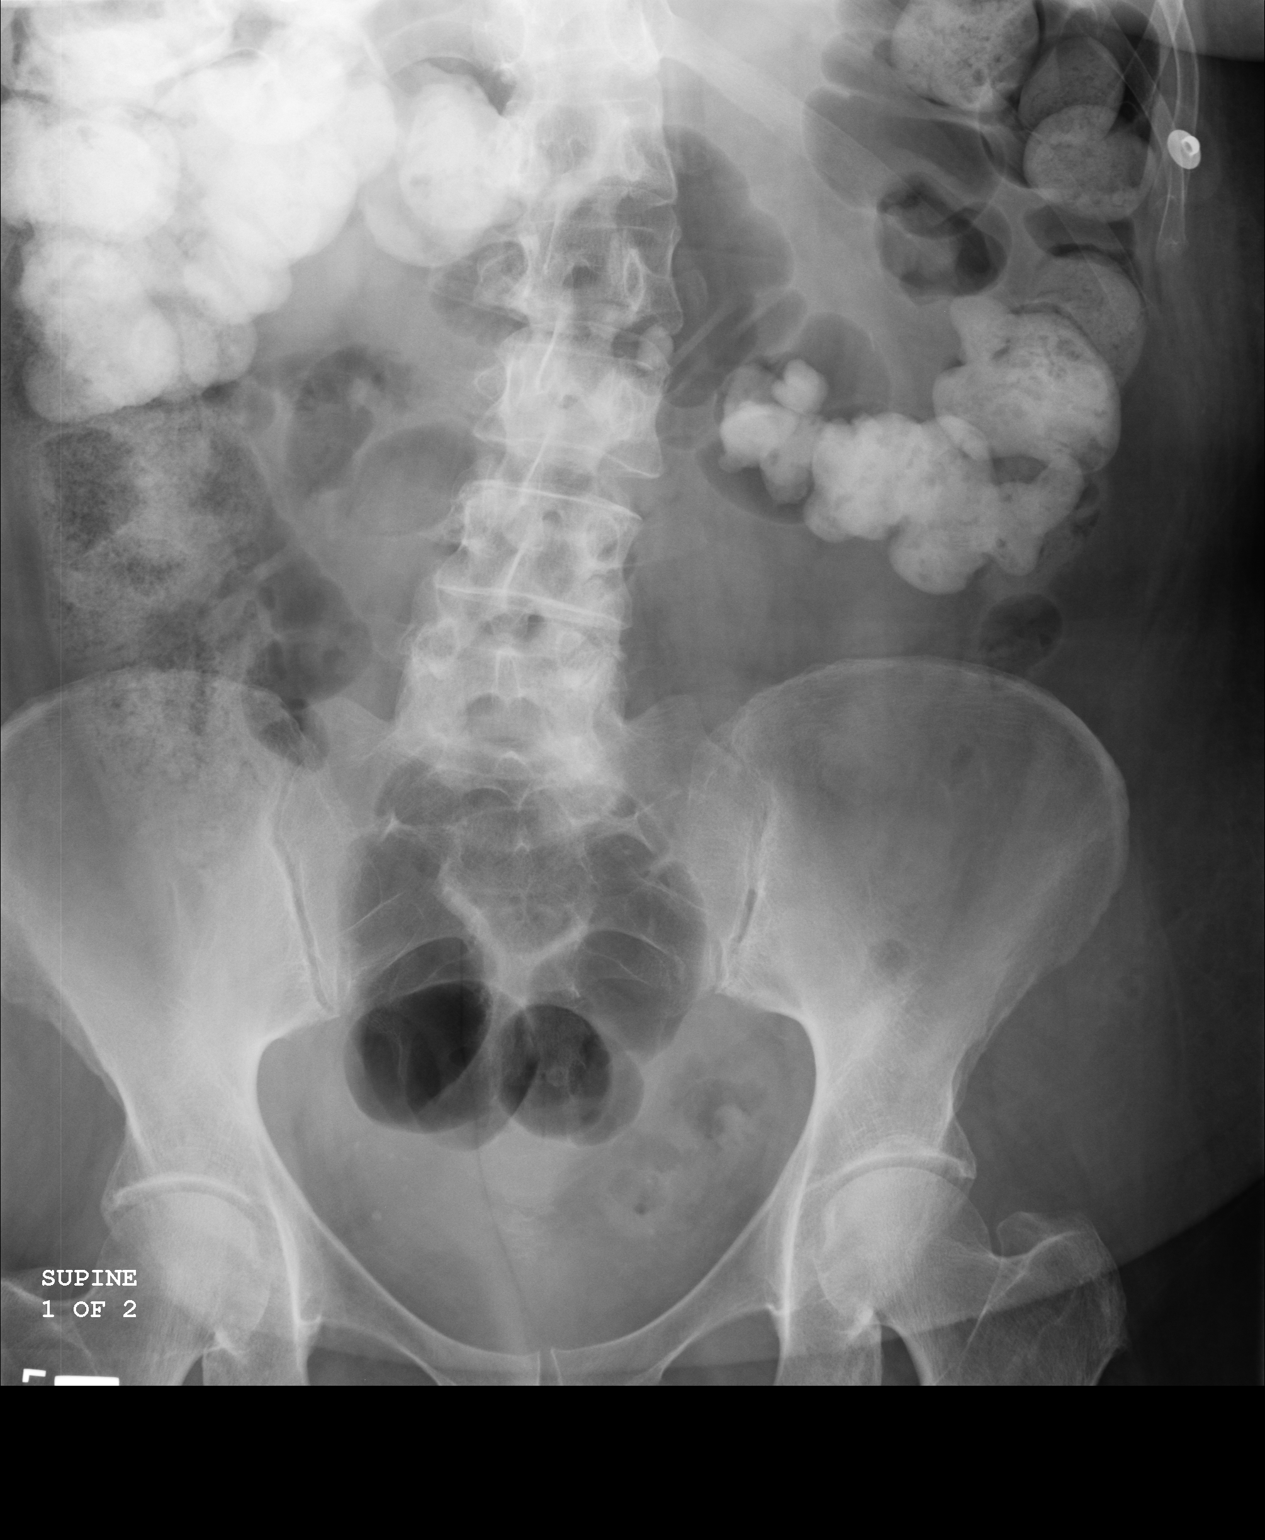
[im 2/2]
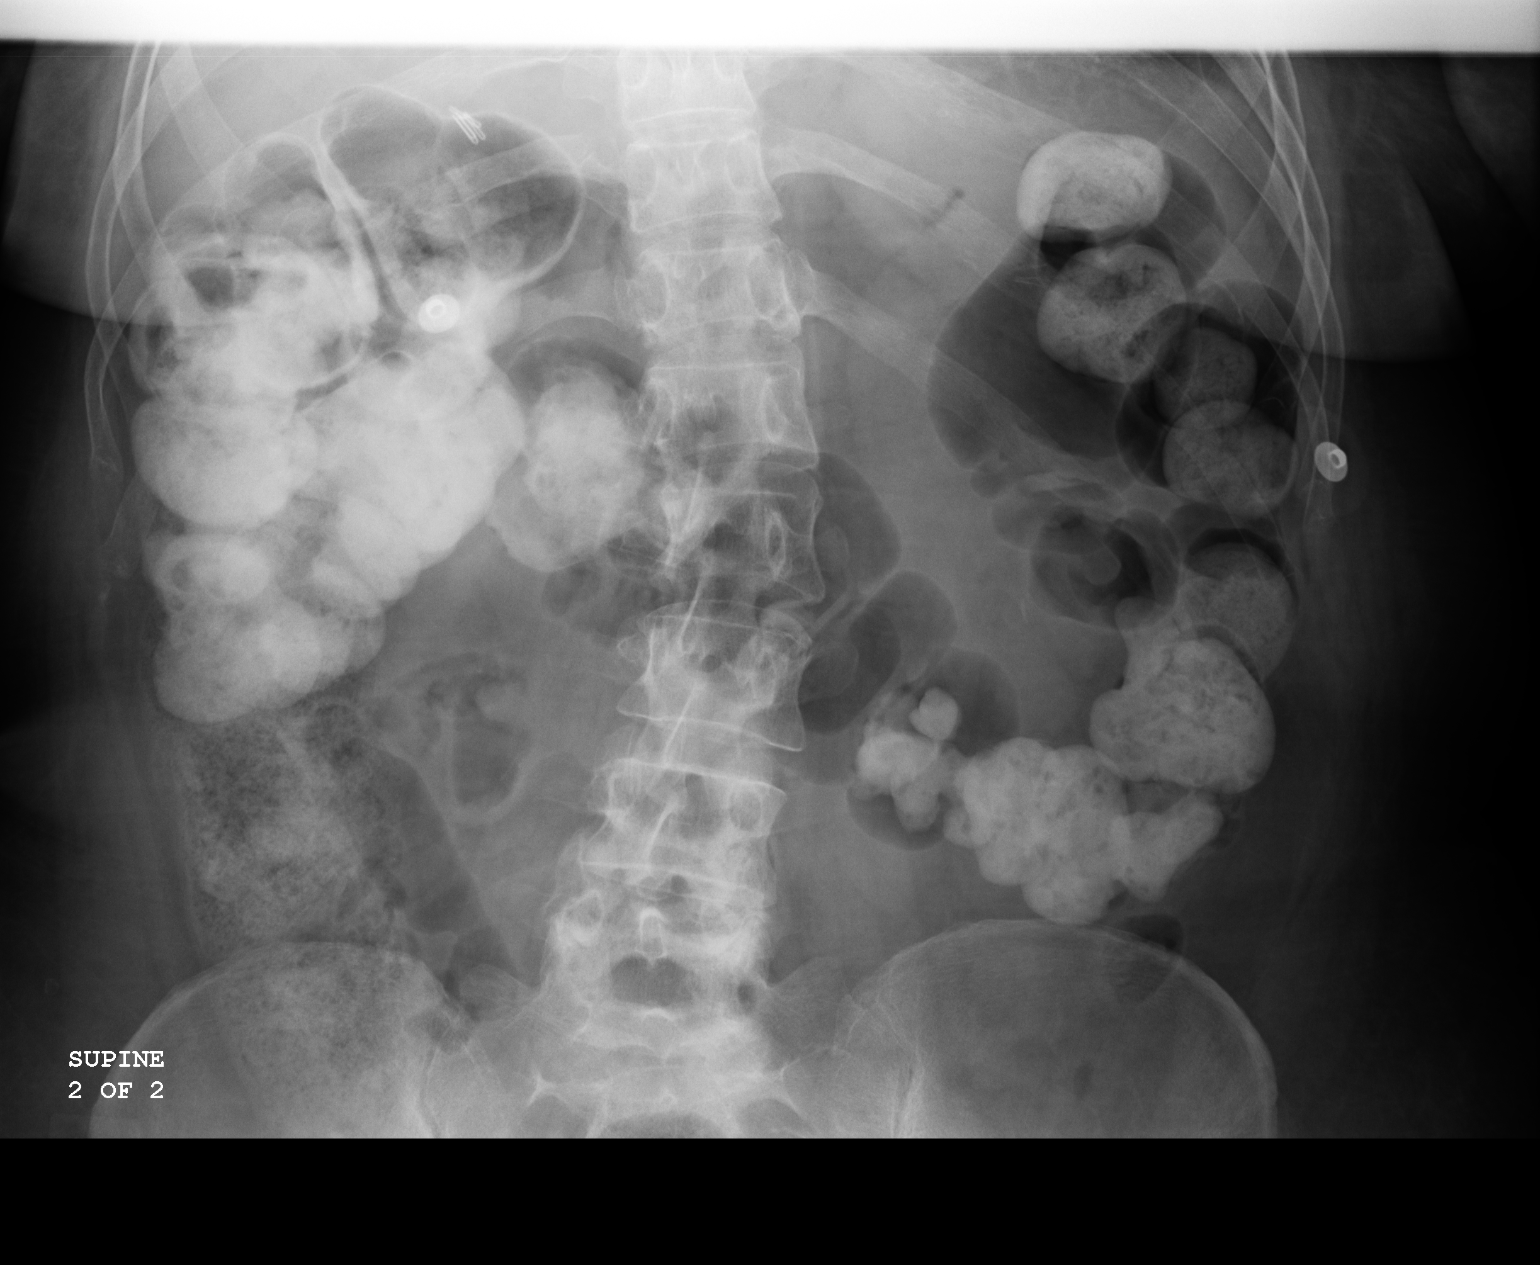

[2 of 2 positions shown; findings below may reference images not displayed]

FINDINGS: Supine radiograph of the abdomen is provided.

There is a nonspecific bowel gas pattern. There is contrast within the colon
from prior CT of the abdomen. There is no bowel dilatation to suggest
obstruction. There is no pathologic calcification along the expected course
of the ureters. There is no evidence of pneumoperitoneum, portal venous gas,
or pneumatosis.

The osseous structures are unremarkable.
IMPRESSION: Unremarkable KUB.

## 2013-04-17 ENCOUNTER — Ambulatory Visit: Payer: Self-pay | Admitting: Internal Medicine

## 2013-04-23 ENCOUNTER — Ambulatory Visit: Payer: Self-pay | Admitting: Internal Medicine

## 2013-06-28 IMAGING — CR DG ABDOMEN 3V
1 series · 3 of 3 positions shown · non-contrast
Comparison: none

REASON FOR EXAM: abd pain
COMMENTS:

PROCEDURE:     DXR - DXR ABDOMEN COMPLETE  - August 05, 2011 [DATE]
RESULT:     Comparisons:  None

[Series 1: view not recorded · 0.17mm/px · 3 of 3 slices shown]
[im 1/3]
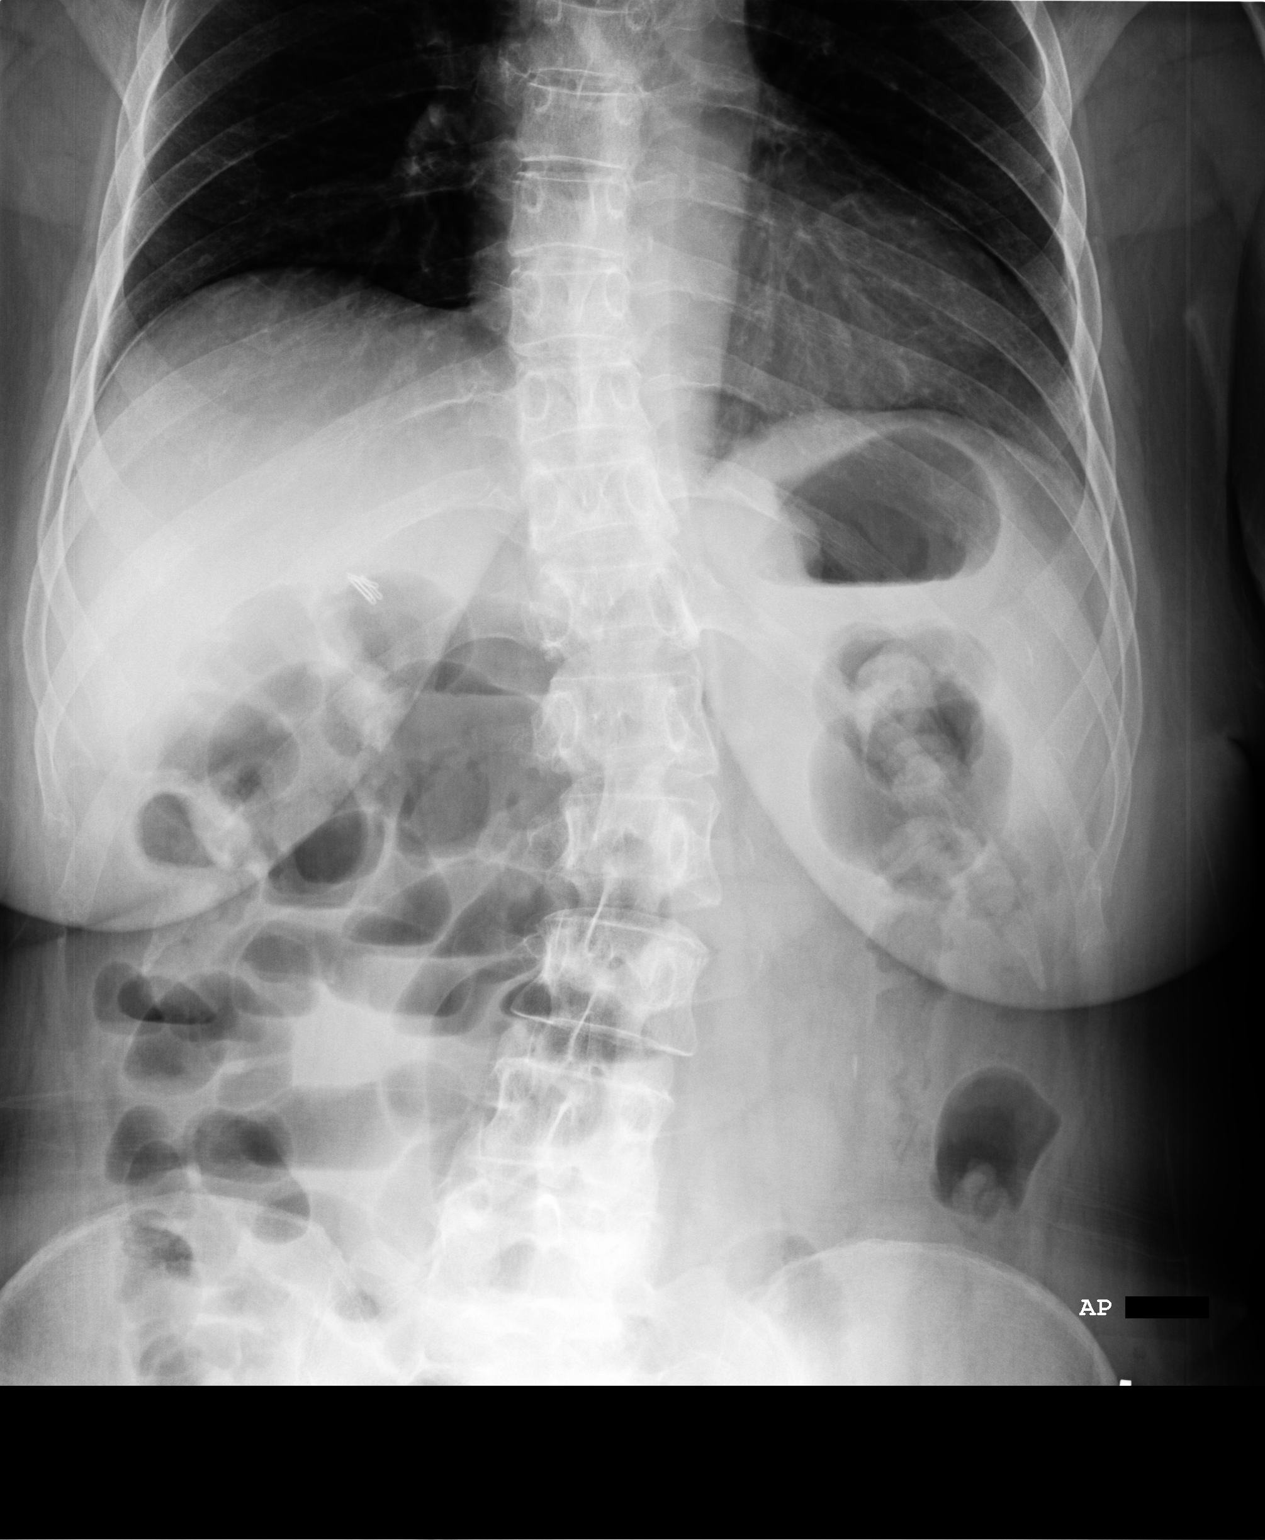
[im 2/3]
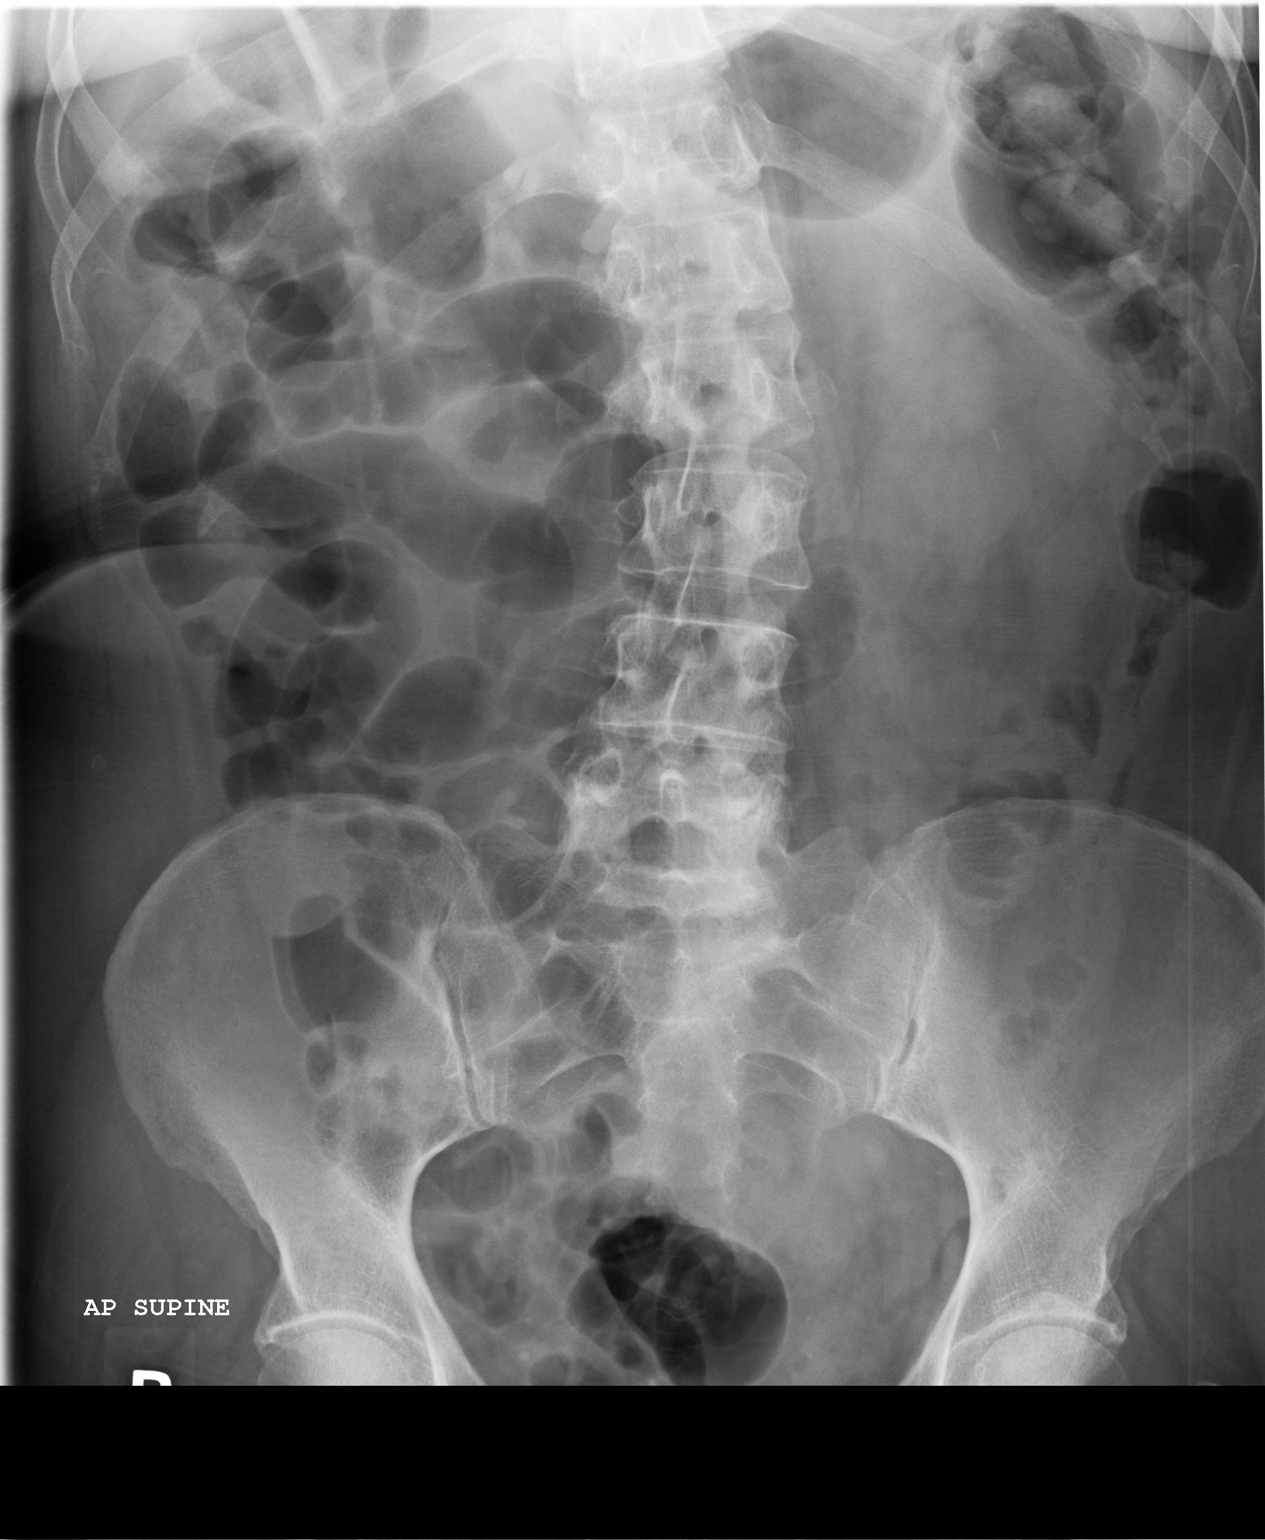
[im 3/3]
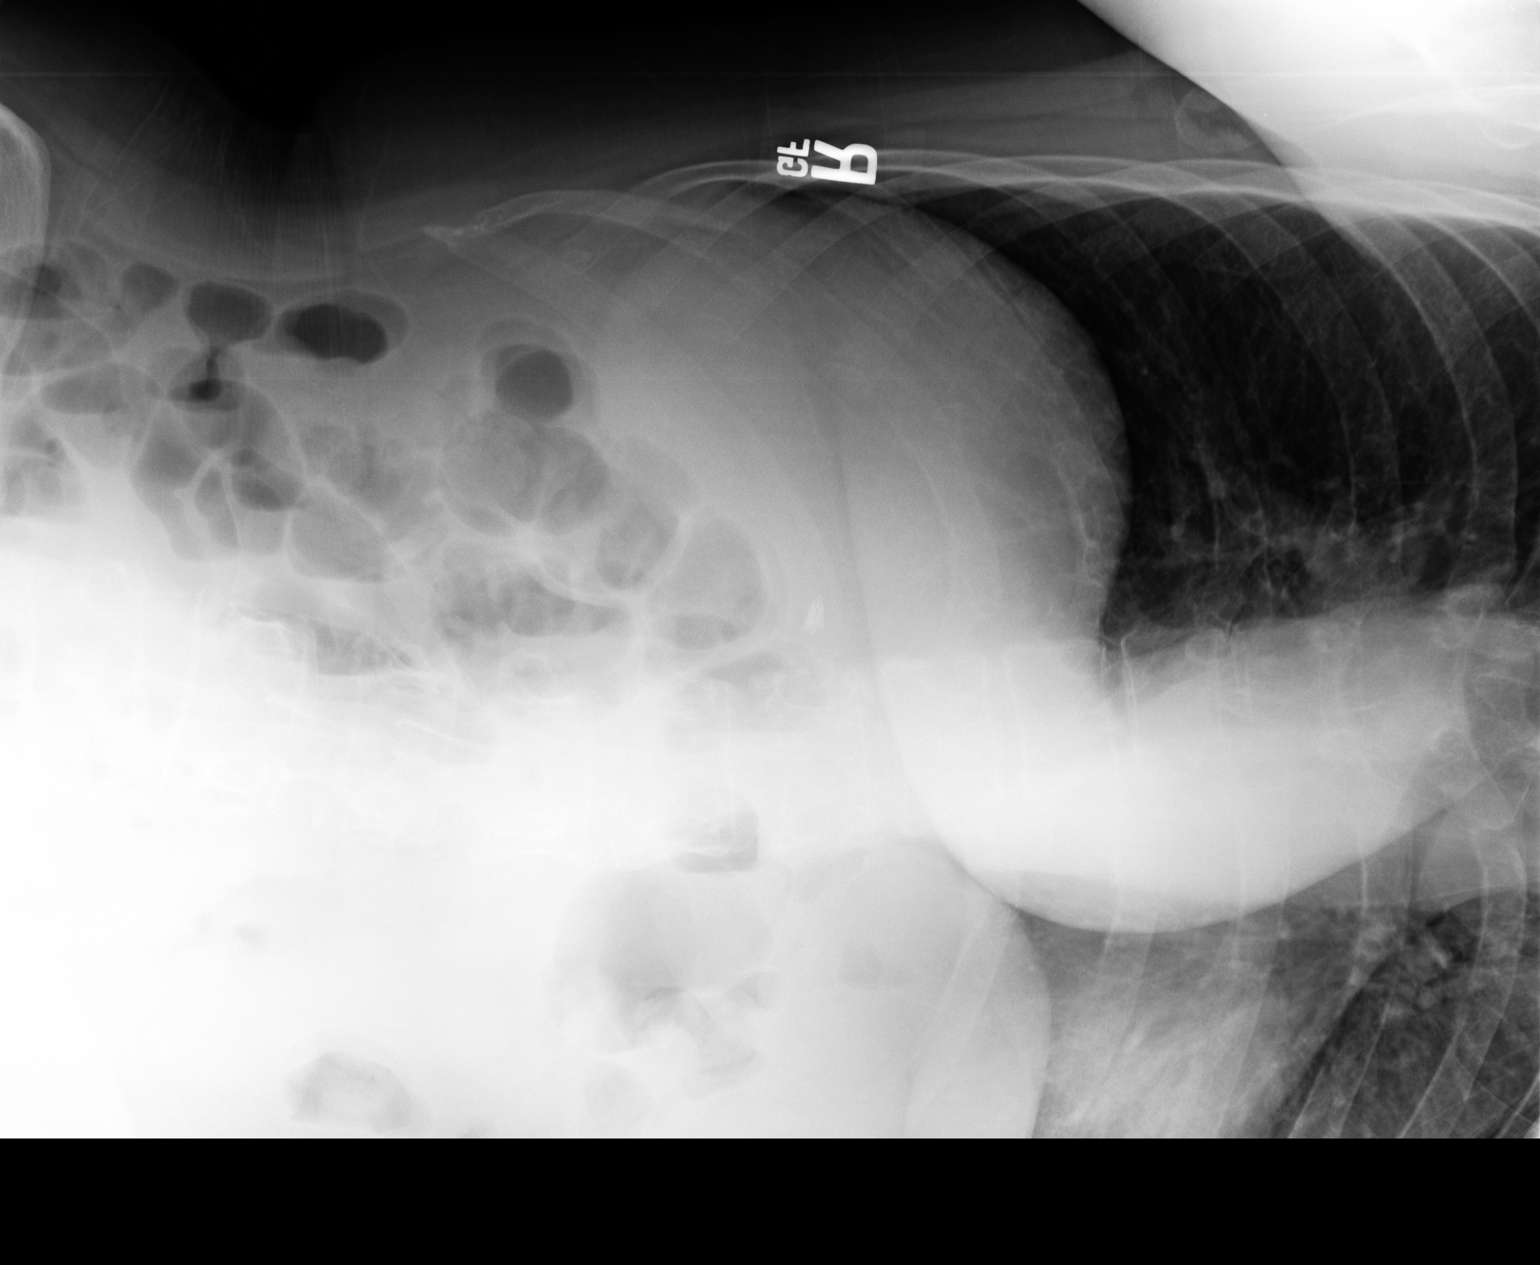

[3 of 3 positions shown; findings below may reference images not displayed]

FINDINGS: Supine, upright and left lateral decubitus views of the abdomen are provided.

There is a nonspecific bowel gas pattern. There is no bowel dilatation to
suggest obstruction. There are no air-fluid levels. There is no pathologic
calcification along the expected course of the ureters. There is no evidence
of pneumoperitoneum, portal venous gas, or pneumatosis.

There is an S-shaped scoliosis of the thoracolumbar spine.
IMPRESSION: Unremarkable abdominal radiograph.

## 2013-06-29 IMAGING — CT CT ABD-PELV W/ CM
1 of 2 series · 15 of 32 positions shown, 19 images · IV contrast (isovue)
Comparison: none

REASON FOR EXAM: (1) abd pain; (2) pel pain
COMMENTS:

PROCEDURE:     CT  - CT ABDOMEN / PELVIS  W  - August 06, 2011  [DATE]
RESULT:     Comparison:  05/12/2011 and 09/13/2010
TECHNIQUE: Multiple axial images of the abdomen and pelvis were performed
from the lung bases to the pubic symphysis, with p.o. contrast and with 100
mL of Isovue 370 intravenous contrast.

[Series 2: 3mm soft tissue · axial · 0.78mm/px · z∈[-890,-446]mm · 15 of 162 slices shown, 19 images]
[im 7/162  soft-tissue]
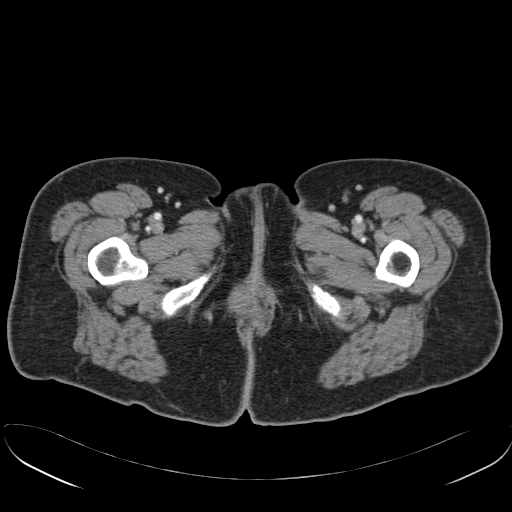
[im 7/162  bone]
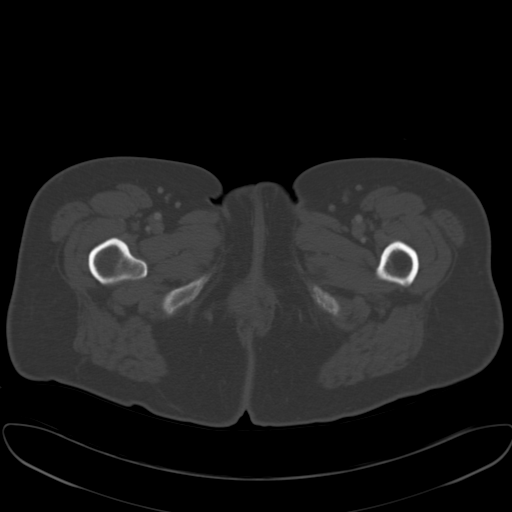
[im 20/162  soft-tissue]
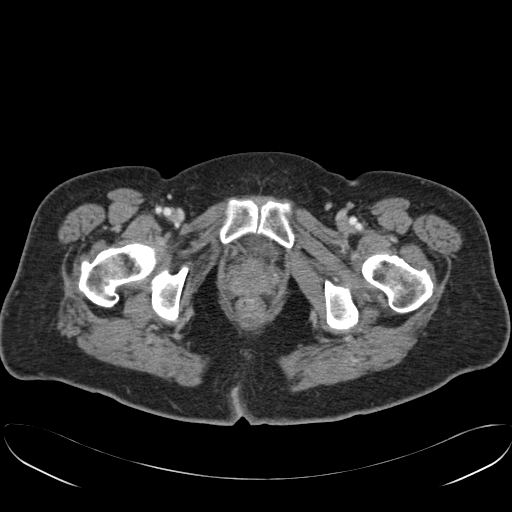
[im 33/162  soft-tissue]
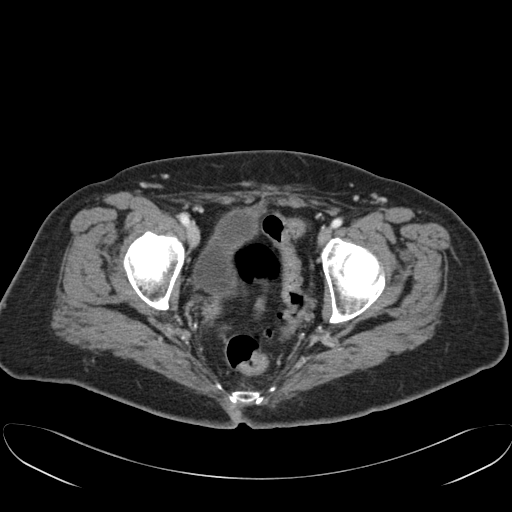
[im 46/162  soft-tissue]
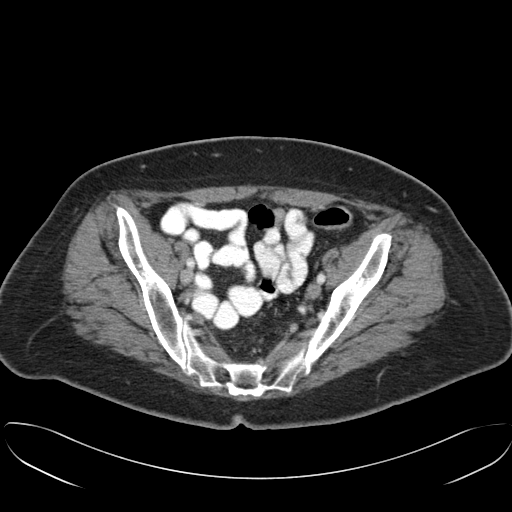
[im 58/162  soft-tissue]
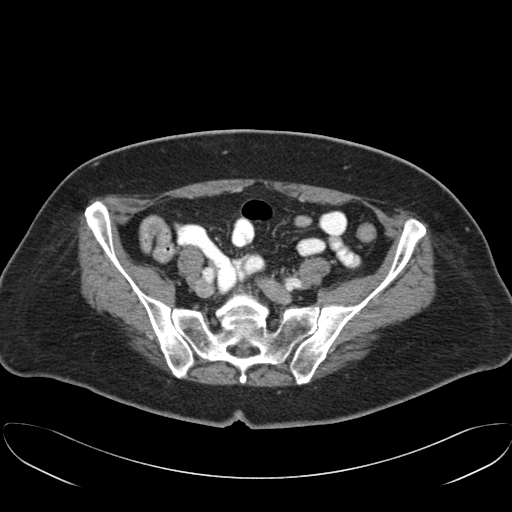
[im 71/162  soft-tissue]
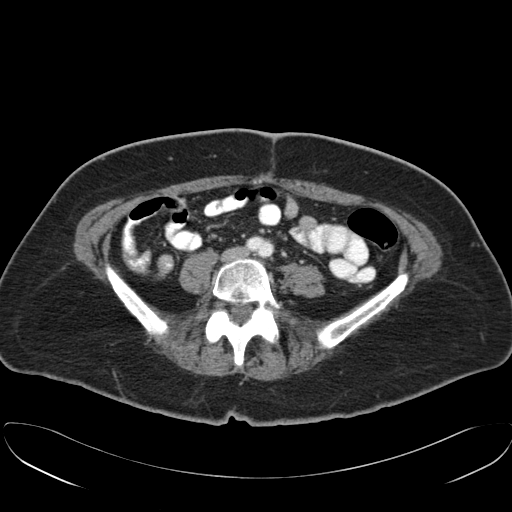
[im 84/162  soft-tissue]
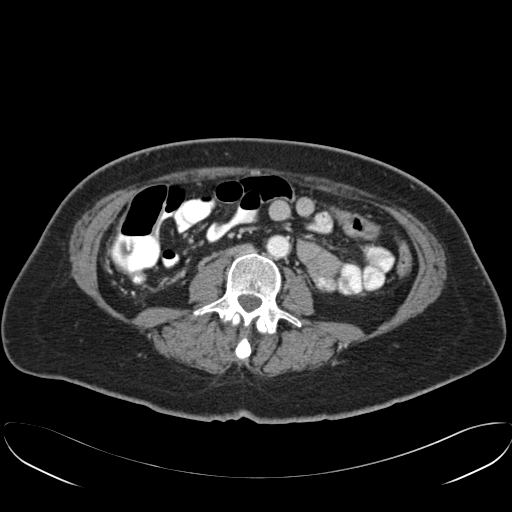
[im 91/162  soft-tissue]
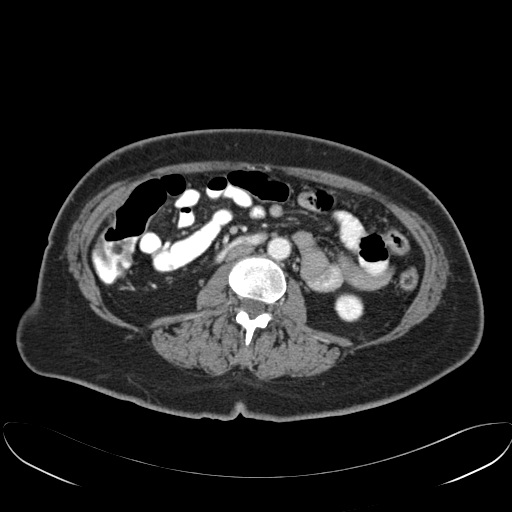
[im 104/162  soft-tissue]
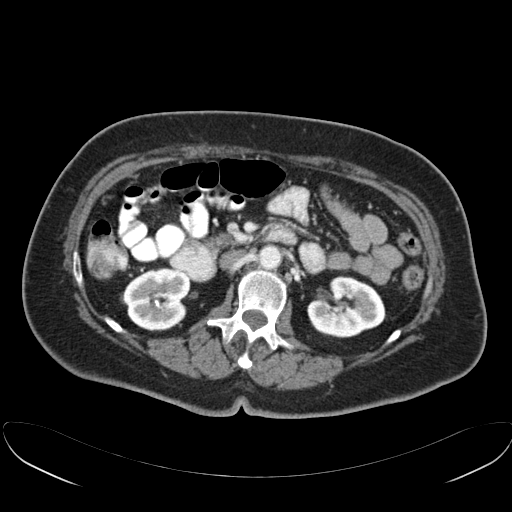
[im 104/162  bone]
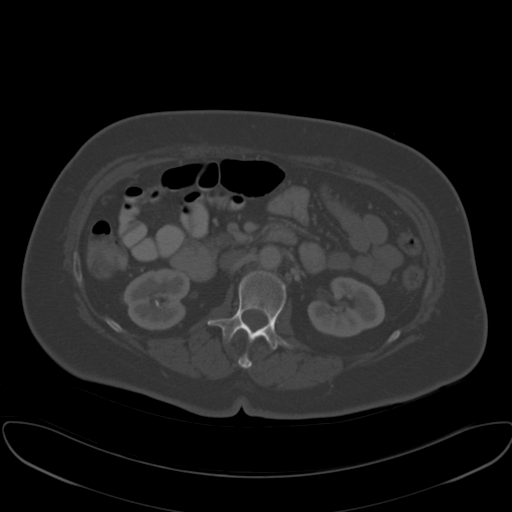
[im 116/162  soft-tissue]
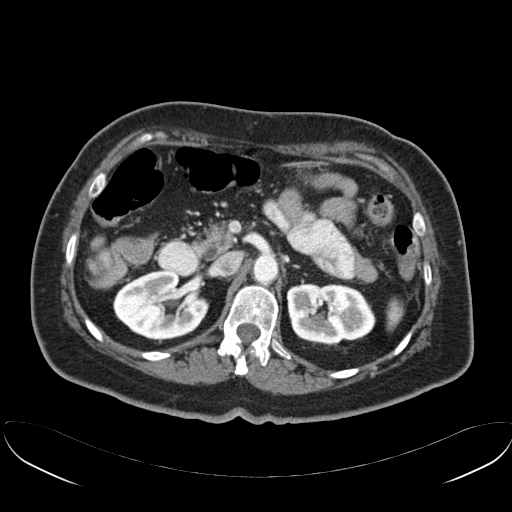
[im 129/162  soft-tissue]
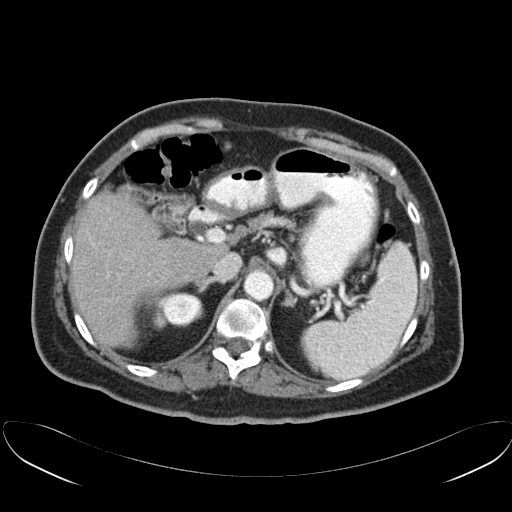
[im 136/162  lung]
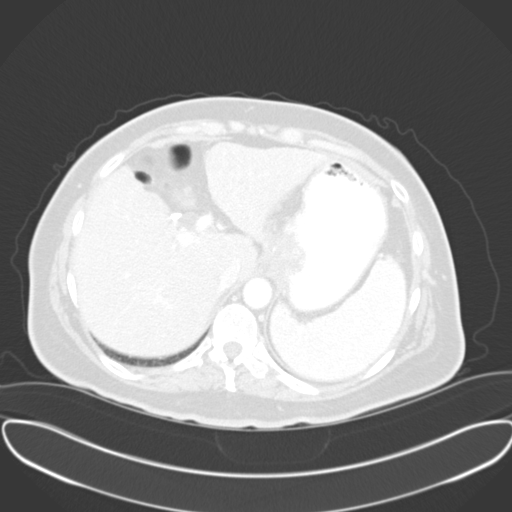
[im 142/162  soft-tissue]
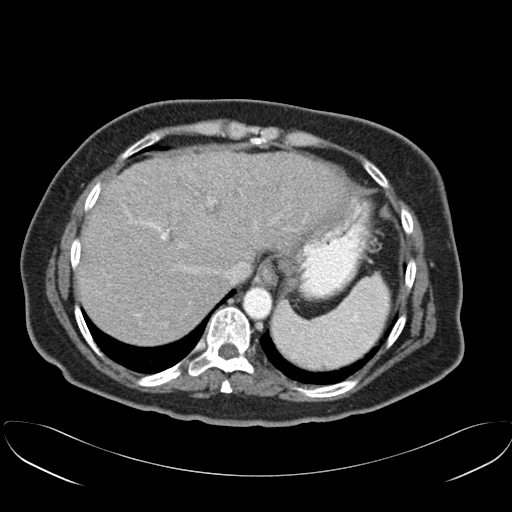
[im 142/162  lung]
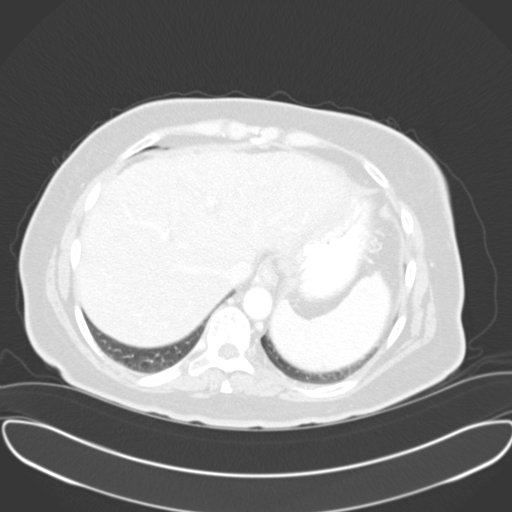
[im 149/162  lung]
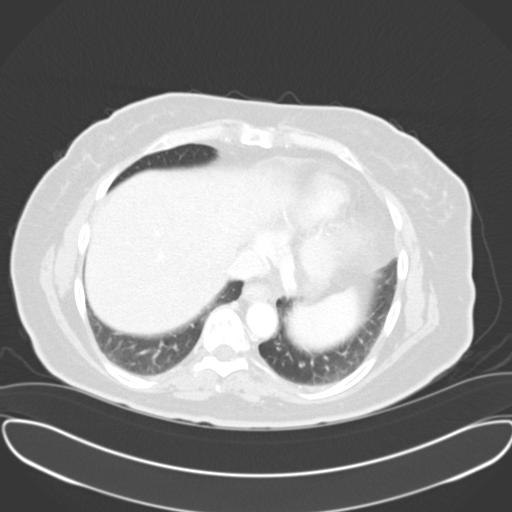
[im 155/162  soft-tissue]
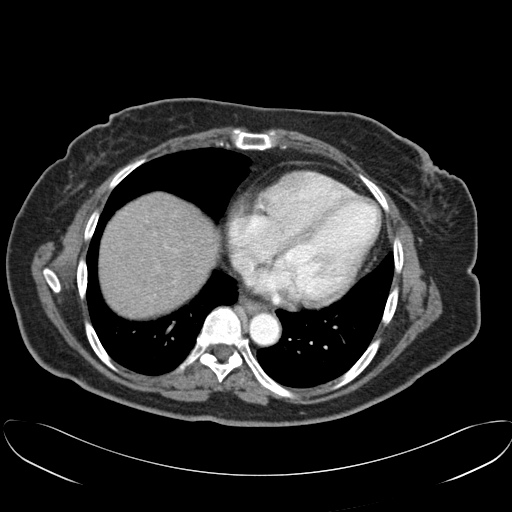
[im 155/162  lung]
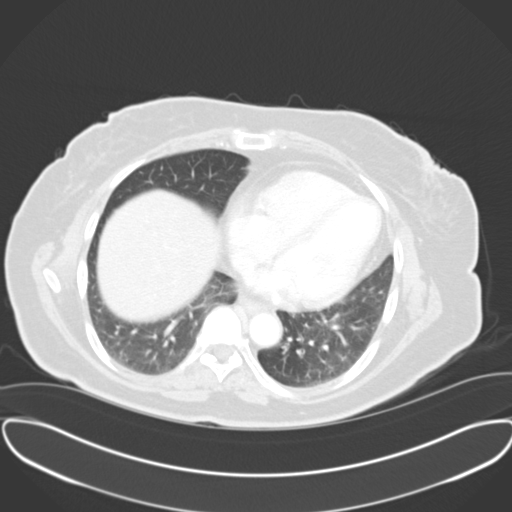

[15 of 32 positions shown; findings below may reference images not displayed]

FINDINGS: The liver, spleen, adrenals, and pancreas are unremarkable. Surgical clips
are seen from prior cholecystectomy. There is mild soft tissue thickening in
the region of the gastroesophageal junction which may be secondary to
underdistention. This is similar to prior. The pancreas is unremarkable.
There are small calculi the bilateral kidneys. The largest measures 6 mm. No
hydronephrosis. No ureterectasis. The small and large bowel are normal in
caliber. The appendix is normal.

No aggressive lytic or sclerotic osseous lesions are identified.
IMPRESSION: 1. No acute findings in the abdomen or pelvis.
2. Nephrolithiasis, without hydronephrosis.
3. Mild soft tissue thickening at the gastroesophageal junction may be
secondary to underdistention. This could be related to prior surgery.
Correlate clinically. A mass cannot be excluded. In the lack of a prior
surgical history, consider further evaluation with endoscopy.

The preliminary report was faxed to the clinician at the time of the study.
The final report, including impression 3, was called to Dr. Rauof Kahkesh at
0590 hours 08/06/2011.

## 2013-07-16 ENCOUNTER — Observation Stay: Payer: Self-pay | Admitting: Internal Medicine

## 2013-07-16 LAB — COMPREHENSIVE METABOLIC PANEL
Alkaline Phosphatase: 155 U/L — ABNORMAL HIGH (ref 50–136)
BUN: 12 mg/dL (ref 7–18)
Calcium, Total: 9.1 mg/dL (ref 8.5–10.1)
Chloride: 103 mmol/L (ref 98–107)
Co2: 25 mmol/L (ref 21–32)
Creatinine: 0.7 mg/dL (ref 0.60–1.30)
EGFR (African American): >60
EGFR (Non-African Amer.): >60
Osmolality: 286 (ref 275–301)
Sodium: 135 mmol/L — ABNORMAL LOW (ref 136–145)

## 2013-07-16 LAB — CBC
HCT: 44.9 % (ref 35.0–47.0)
HGB: 15.4 g/dL (ref 12.0–16.0)
MCH: 32.1 pg (ref 26.0–34.0)
Platelet: 133 10*3/uL — ABNORMAL LOW (ref 150–440)
RBC: 4.79 10*6/uL (ref 3.80–5.20)
RDW: 13.8 % (ref 11.5–14.5)

## 2013-07-16 LAB — LIPASE, BLOOD: Lipase: 253 U/L (ref 73–393)

## 2013-07-16 LAB — URINALYSIS, COMPLETE
RBC,UR: 50 /HPF (ref 0–5)
Squamous Epithelial: 5

## 2013-07-16 LAB — TROPONIN I
Troponin-I: <0.02 ng/mL
Troponin-I: <0.02 ng/mL
Troponin-I: <0.02 ng/mL

## 2013-07-16 LAB — CK-MB: CK-MB: 1 ng/mL (ref 0.5–3.6)

## 2013-07-16 IMAGING — CT CT HEART MORP W/ CTA COR W/ SCORE W/ CA W/CM &/OR W/O CM
1 of 5 series · 11 of 20 positions shown, 14 images · IV contrast (CONTRAST)
Comparison: Abdominal CT view 10/23/2007

***ADDENDUM*** CREATED: 08/24/2011 [DATE]

OVER-READ INTERPRETATION - CT CHEST
The following report is an over-read performed by radiologist Dr.
[DATE].  This over-read does not include interpretation of
cardiac or coronary anatomy or pathology.  The CTA interpretation
by the cardiologist is attached.
INDICATION: Recurrent chest pain and tachycardia
PROTOCOL: The patient was scanned on a Philips 256 scanner.
Average HR during scan was 67 bpm.  The patient received 15 mg of
iv Lopressor and sl nitro.  A 100 kV prospective scan was done with
5% padding around 78% of the R-R interval.  Gantry rotation was 300
msec and collimation m9mm.  The scan was triggered in the
descending thoracic aorta at 111 HU.  The 3D data set was viewed on
a [employer]ips work station using CMPR, VRT and MIP modes.  The patient
received 80cc of contrast.

[Series 8: w/ edge cor., 78.0% · axial · 0.49mm/px · z∈[-159,-56]mm · 11 of 276 slices shown, 14 images]
[im 23/276  vessel]
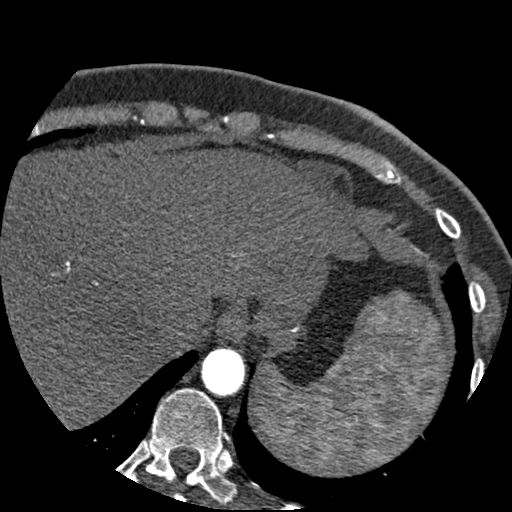
[im 23/276  lung]
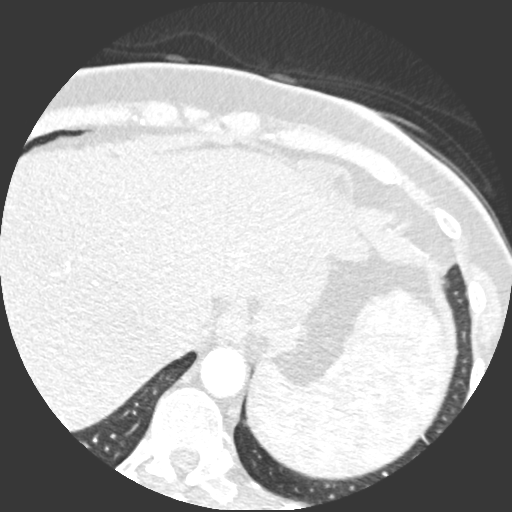
[im 46/276  vessel]
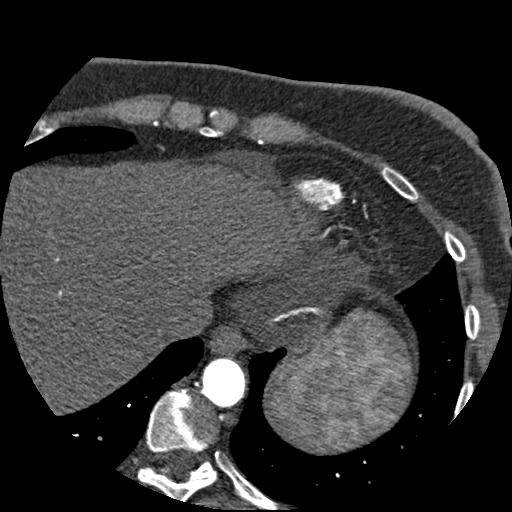
[im 69/276  vessel]
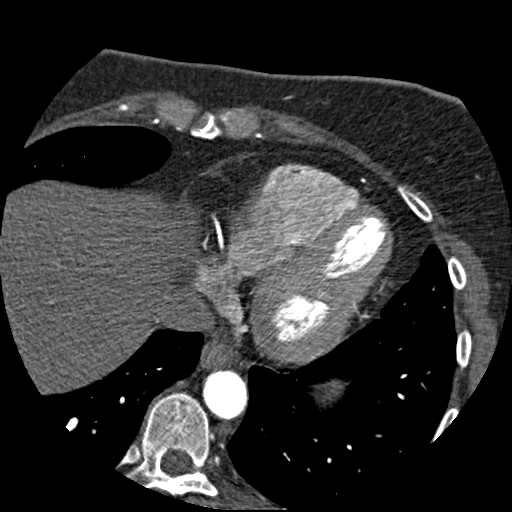
[im 92/276  vessel]
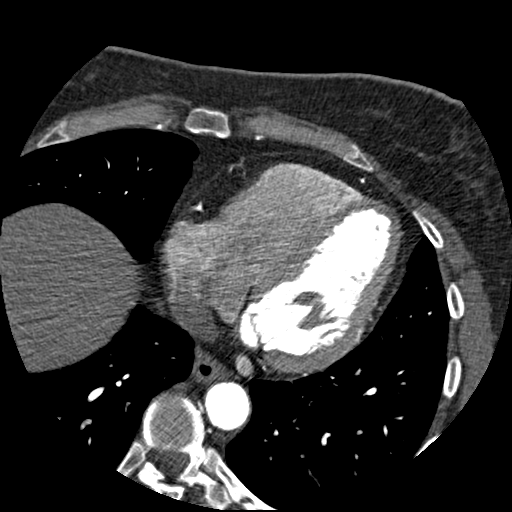
[im 115/276  vessel]
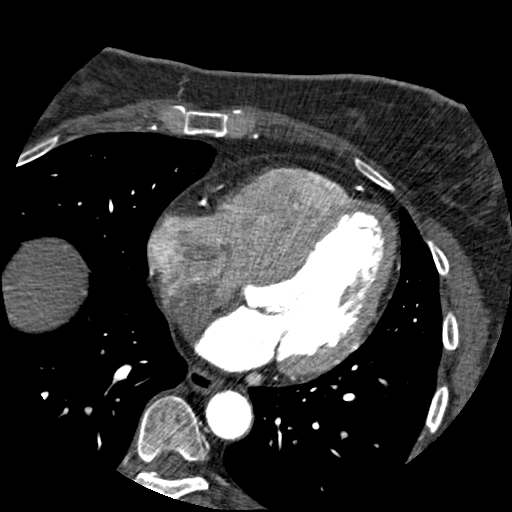
[im 115/276  lung]
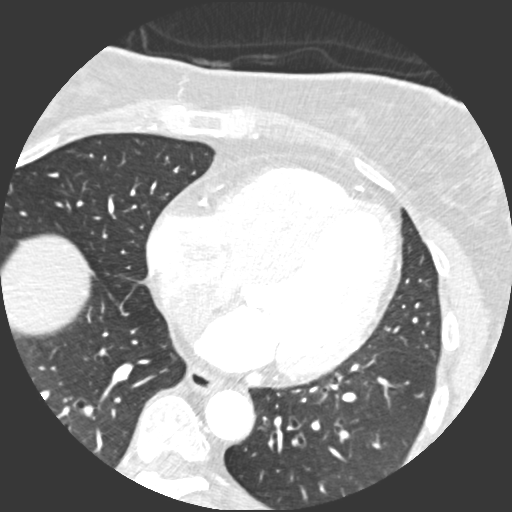
[im 138/276  vessel]
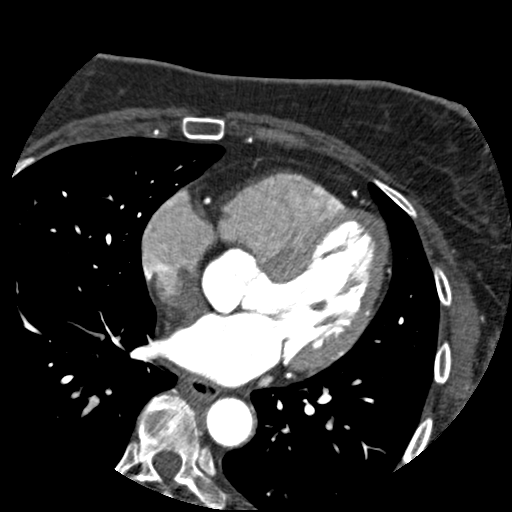
[im 161/276  vessel]
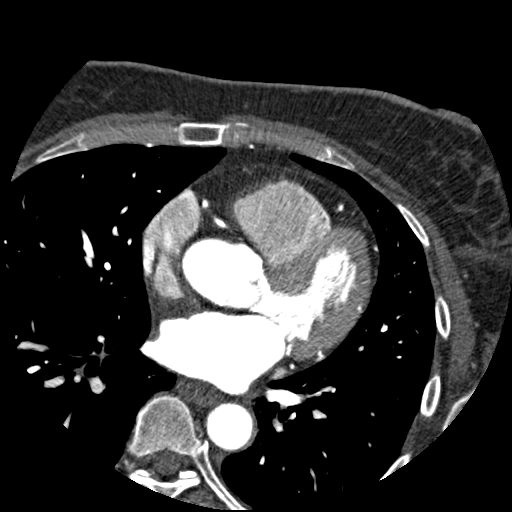
[im 184/276  vessel]
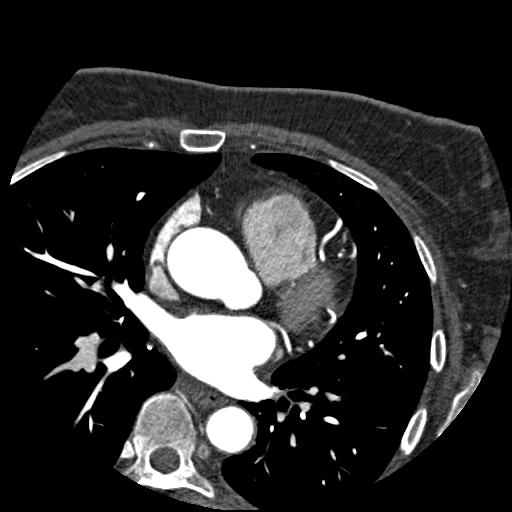
[im 207/276  vessel]
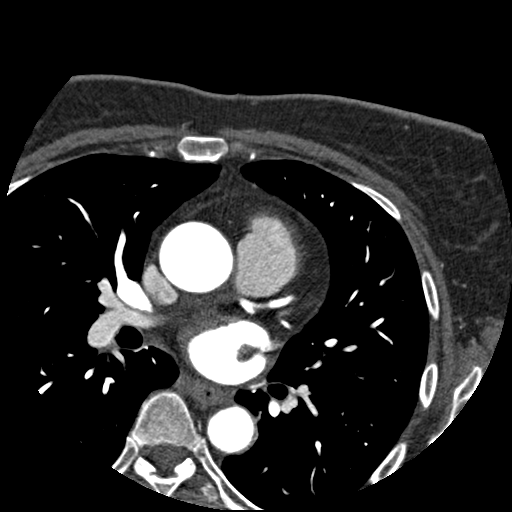
[im 207/276  lung]
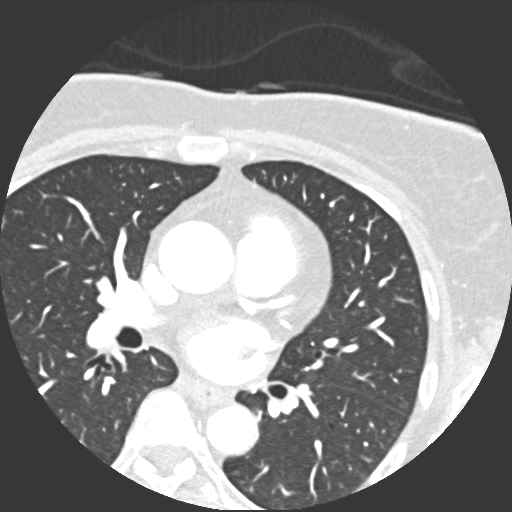
[im 230/276  vessel]
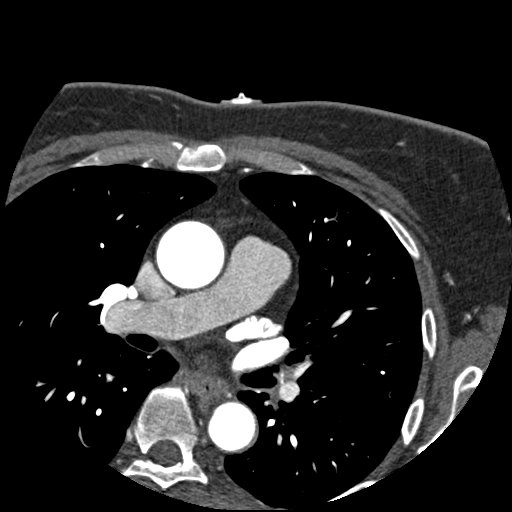
[im 253/276  vessel]
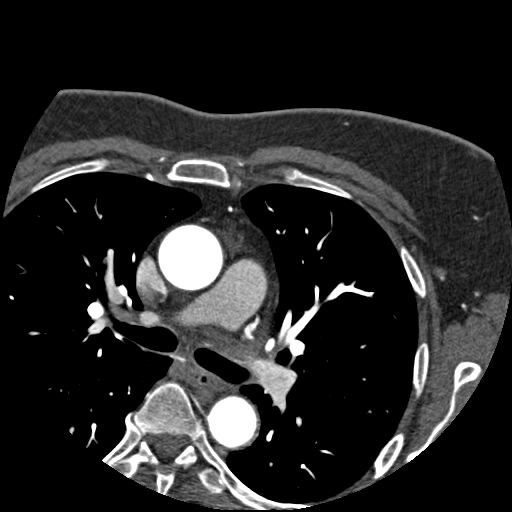

[11 of 20 positions shown; findings below may reference images not displayed]

FINDINGS: Visualized upper abdominal structures are unremarkable.
No significant pericardial or pleural fluid.  There is no evidence
for chest lymphadenopathy.   There are two punctate nodules in the
medial left lung base.  These nodules measure 2-3 mm and best seen
on sequence #8, image 227.  The largest nodule is slightly dense
and could represent a small granuloma.  There are no other definite
pulmonary nodules.  These small pulmonary nodules were not clearly
identified on the prior examination.  No gross bony abnormality.
IMPRESSION: No acute chest findings.

There are two punctate nodular densities in the medial left lower
lobe.  These small nodules are indeterminate but could represent
small granulomas. If the patient is at high risk for bronchogenic
carcinoma, follow-up chest CT at 1 year is recommended.  If the
patient is at low risk, no follow-up is needed.  This
recommendation follows the consensus statement: Guidelines for
Management of Small Pulmonary Nodules Detected on CT Scans:  A
Statement from the [HOSPITAL] as published in Radiology
1333; [DATE].  Available online at:
[URL]

***END ADDENDUM*** SIGNED BY: Abimelk Tiger, M.D.

Cardiac CT:
Calcium Score:  0

Noncardiac Structures:  Limited lung and soft tissue windows were
reviewed.  No significant findings.  See separate report from
[REDACTED].  The ascending aorta was 3.3 cm at the level
of the RPA and the descending thoracic aorta was 2.1 cm.  There was
mild RV enlargement.  The RA/LA and LV were normal in size.  The
pericardium was normal with no effusion

Cororonary Arteries: Right dominant with no anomalies

LM- normal
LAD- normal
      IM-large and normal
      D1-normal

Cirucmflex: normal
      OM1-large and normal
      Javeria Cristales normal

RCA- dominant and normal including PLA and PDA
IMPRESSION: 1)    Calcium Score 0

2)    Normal right dominant coronary arteries
3)    Mild RV enlargement
4)    No significant noncardiac findings See separate report from
Copy to [facility]

## 2013-07-17 LAB — BASIC METABOLIC PANEL
Anion Gap: 7 (ref 7–16)
BUN: 10 mg/dL (ref 7–18)
Calcium, Total: 7.8 mg/dL — ABNORMAL LOW (ref 8.5–10.1)
Chloride: 110 mmol/L — ABNORMAL HIGH (ref 98–107)
Co2: 24 mmol/L (ref 21–32)
EGFR (African American): >60
EGFR (Non-African Amer.): >60
Glucose: 273 mg/dL — ABNORMAL HIGH (ref 65–99)
Potassium: 3.3 mmol/L — ABNORMAL LOW (ref 3.5–5.1)

## 2013-07-18 LAB — URINE CULTURE

## 2013-07-20 LAB — COMPREHENSIVE METABOLIC PANEL
Alkaline Phosphatase: 115 U/L (ref 50–136)
Anion Gap: 5 — ABNORMAL LOW (ref 7–16)
BUN: 10 mg/dL (ref 7–18)
Calcium, Total: 8.4 mg/dL — ABNORMAL LOW (ref 8.5–10.1)
EGFR (African American): >60
Osmolality: 280 (ref 275–301)
Potassium: 3.4 mmol/L — ABNORMAL LOW (ref 3.5–5.1)
SGOT(AST): 41 U/L — ABNORMAL HIGH (ref 15–37)
SGPT (ALT): 53 U/L (ref 12–78)
Sodium: 135 mmol/L — ABNORMAL LOW (ref 136–145)

## 2013-07-20 LAB — CBC WITH DIFFERENTIAL/PLATELET
Basophil %: 0.2 %
Eosinophil %: 0.3 %
HCT: 44.1 % (ref 35.0–47.0)
HGB: 15.1 g/dL (ref 12.0–16.0)
Lymphocyte #: 1.1 10*3/uL (ref 1.0–3.6)
MCH: 32 pg (ref 26.0–34.0)
MCHC: 34.2 g/dL (ref 32.0–36.0)
MCV: 94 fL (ref 80–100)
Monocyte #: 0.5 x10 3/mm (ref 0.2–0.9)
Neutrophil #: 8.8 10*3/uL — ABNORMAL HIGH (ref 1.4–6.5)
Neutrophil %: 84.1 %
Platelet: 100 10*3/uL — ABNORMAL LOW (ref 150–440)
RBC: 4.72 10*6/uL (ref 3.80–5.20)
WBC: 10.5 10*3/uL (ref 3.6–11.0)

## 2013-07-20 LAB — URINALYSIS, COMPLETE
Bilirubin,UR: NEGATIVE
Blood: NEGATIVE
Glucose,UR: >=500 mg/dL (ref 0–75)
Nitrite: NEGATIVE
Protein: 100
Specific Gravity: 1.017 (ref 1.003–1.030)
Squamous Epithelial: 9
Transitional Epi: <1
WBC UR: 8 /HPF (ref 0–5)

## 2013-07-21 ENCOUNTER — Inpatient Hospital Stay: Payer: Self-pay | Admitting: Family Medicine

## 2013-07-21 LAB — CBC WITH DIFFERENTIAL/PLATELET
Eosinophil %: 1.9 %
HCT: 34.2 % — ABNORMAL LOW (ref 35.0–47.0)
Lymphocyte #: 1.8 10*3/uL (ref 1.0–3.6)
Lymphocyte %: 32.2 %
MCH: 32.8 pg (ref 26.0–34.0)
MCV: 94 fL (ref 80–100)
Monocyte #: 0.4 x10 3/mm (ref 0.2–0.9)
Monocyte %: 6.3 %
Neutrophil %: 59.3 %
RBC: 3.63 10*6/uL — ABNORMAL LOW (ref 3.80–5.20)
RDW: 14 % (ref 11.5–14.5)
WBC: 5.7 10*3/uL (ref 3.6–11.0)

## 2013-07-21 LAB — HEMOGLOBIN A1C: Hemoglobin A1C: 10.4 % — ABNORMAL HIGH (ref 4.2–6.3)

## 2013-07-21 LAB — BASIC METABOLIC PANEL
Anion Gap: 4 — ABNORMAL LOW (ref 7–16)
BUN: 7 mg/dL (ref 7–18)
Calcium, Total: 7.6 mg/dL — ABNORMAL LOW (ref 8.5–10.1)
Chloride: 110 mmol/L — ABNORMAL HIGH (ref 98–107)
Creatinine: 0.67 mg/dL (ref 0.60–1.30)
EGFR (Non-African Amer.): >60
Glucose: 149 mg/dL — ABNORMAL HIGH (ref 65–99)
Osmolality: 284 (ref 275–301)
Potassium: 3.7 mmol/L (ref 3.5–5.1)
Sodium: 142 mmol/L (ref 136–145)

## 2013-07-22 LAB — CBC WITH DIFFERENTIAL/PLATELET
Basophil %: 0.4 %
HCT: 36 % (ref 35.0–47.0)
Lymphocyte #: 1.6 10*3/uL (ref 1.0–3.6)
MCV: 95 fL (ref 80–100)
Monocyte #: 0.4 x10 3/mm (ref 0.2–0.9)
Monocyte %: 7.1 %
Neutrophil %: 57.9 %
Platelet: 82 10*3/uL — ABNORMAL LOW (ref 150–440)
RBC: 3.77 10*6/uL — ABNORMAL LOW (ref 3.80–5.20)
WBC: 5 10*3/uL (ref 3.6–11.0)

## 2013-07-22 LAB — BASIC METABOLIC PANEL
Anion Gap: 4 — ABNORMAL LOW (ref 7–16)
BUN: 7 mg/dL (ref 7–18)
Calcium, Total: 7.9 mg/dL — ABNORMAL LOW (ref 8.5–10.1)
Chloride: 108 mmol/L — ABNORMAL HIGH (ref 98–107)
Co2: 28 mmol/L (ref 21–32)
Creatinine: 0.52 mg/dL — ABNORMAL LOW (ref 0.60–1.30)
EGFR (African American): >60
EGFR (Non-African Amer.): >60
Glucose: 191 mg/dL — ABNORMAL HIGH (ref 65–99)
Sodium: 140 mmol/L (ref 136–145)

## 2013-07-22 LAB — URINE CULTURE

## 2013-07-23 LAB — BASIC METABOLIC PANEL
Anion Gap: 3 — ABNORMAL LOW (ref 7–16)
Calcium, Total: 7.9 mg/dL — ABNORMAL LOW (ref 8.5–10.1)
Chloride: 107 mmol/L (ref 98–107)
Co2: 29 mmol/L (ref 21–32)
Creatinine: 0.65 mg/dL (ref 0.60–1.30)
EGFR (Non-African Amer.): >60
Glucose: 204 mg/dL — ABNORMAL HIGH (ref 65–99)
Osmolality: 281 (ref 275–301)
Potassium: 3.7 mmol/L (ref 3.5–5.1)

## 2013-07-23 LAB — CK TOTAL AND CKMB (NOT AT ARMC)
CK, Total: 28 U/L (ref 21–215)
CK-MB: 0.7 ng/mL (ref 0.5–3.6)

## 2013-07-24 LAB — BASIC METABOLIC PANEL
BUN: 10 mg/dL (ref 7–18)
Calcium, Total: 7.8 mg/dL — ABNORMAL LOW (ref 8.5–10.1)
Chloride: 105 mmol/L (ref 98–107)
Creatinine: 0.71 mg/dL (ref 0.60–1.30)
EGFR (African American): >60
Glucose: 359 mg/dL — ABNORMAL HIGH (ref 65–99)
Potassium: 3.8 mmol/L (ref 3.5–5.1)
Sodium: 137 mmol/L (ref 136–145)

## 2013-10-04 IMAGING — CR DG CHEST 1V PORT
1 series · 1 of 1 positions shown · non-contrast
Comparison: none

REASON FOR EXAM: Line placement
COMMENTS:

PROCEDURE:     DXR - DXR PORTABLE CHEST SINGLE VIEW  - November 11, 2011  [DATE]
RESULT:     Comparison is made to prior study dated 11/11/2011 earlier time.

[portable]
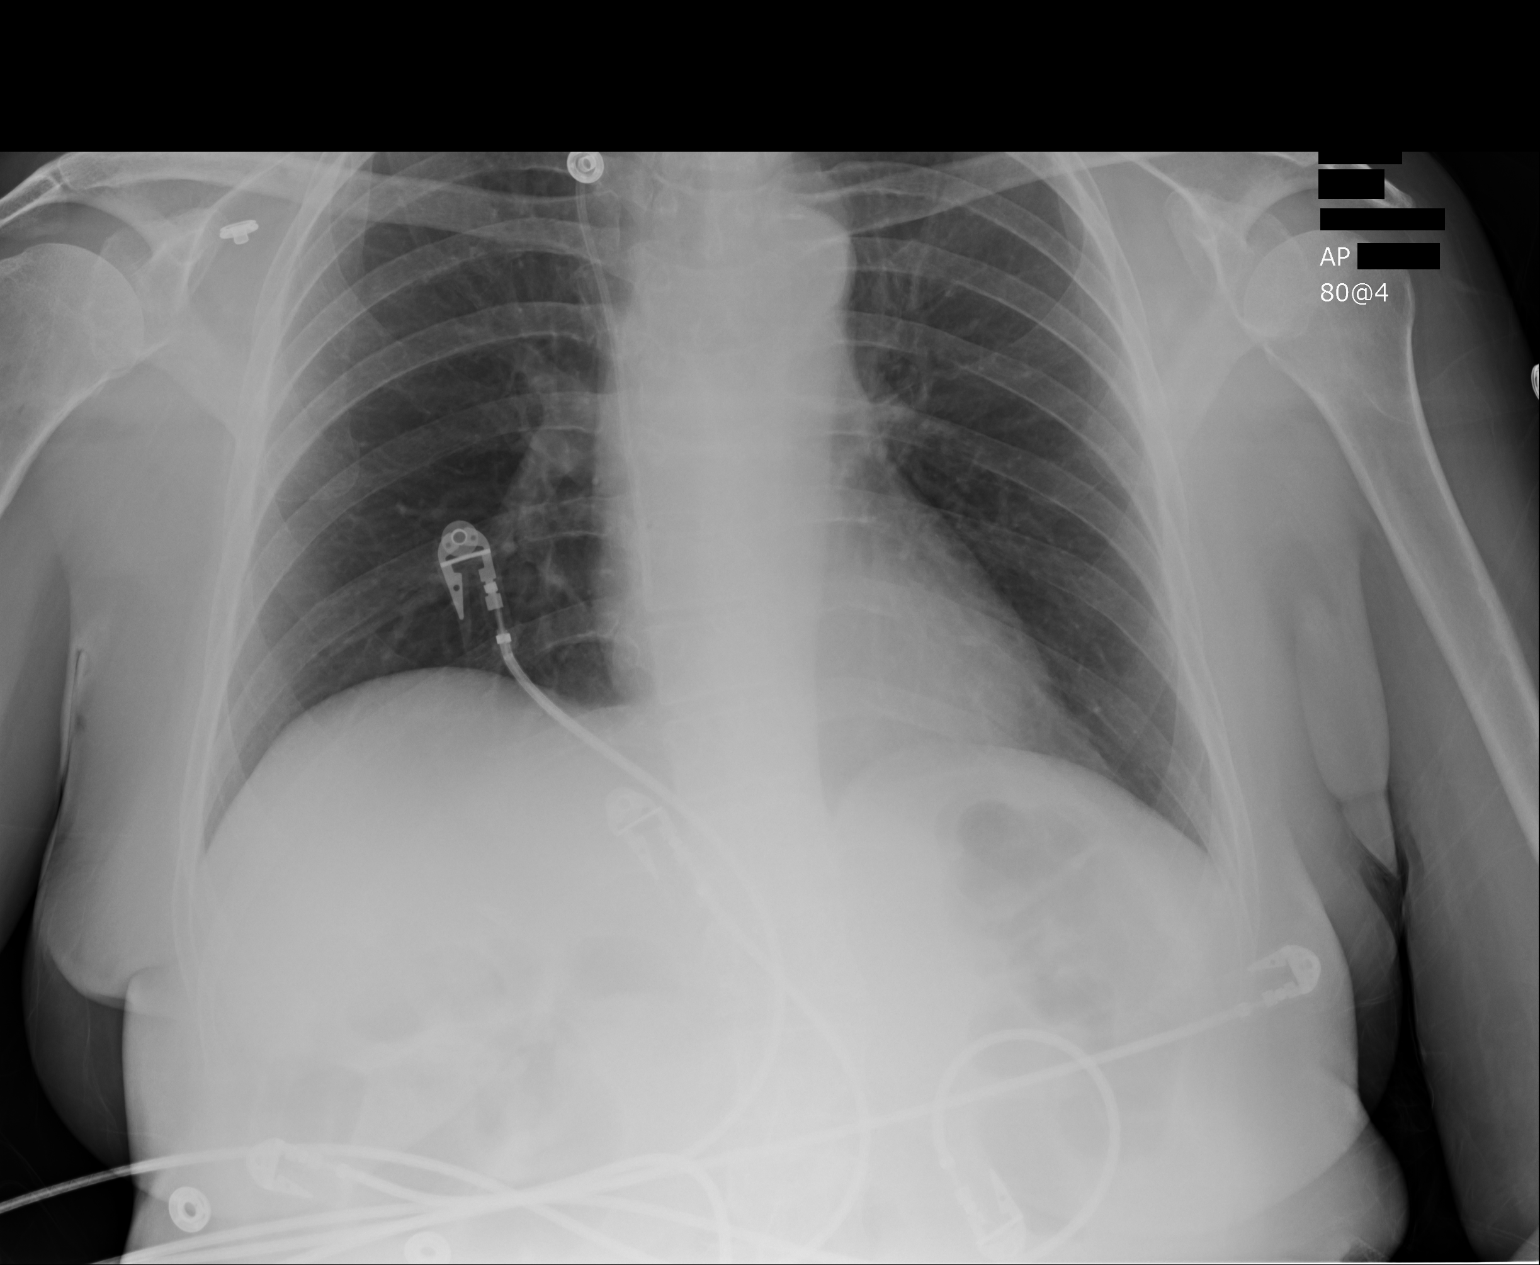

[1 of 1 positions shown; findings below may reference images not displayed]

FINDINGS: In the interim a right-sided internal jugular catheter has been
placed with tip projecting in the region of the right atrium. Otherwise,
there is no significant change in the chest radiograph. There is no evidence
of pneumothorax.
IMPRESSION: Interval placement of a right-sided central venous catheter.

## 2013-10-04 IMAGING — CT CT HEAD WITHOUT CONTRAST
2 series · 16 of 30 positions shown, 20 images · non-contrast
Comparison: none

REASON FOR EXAM: Fall, syncopal episode
COMMENTS:

[Series 2: without · axial · non-contrast · 0.39mm/px · z∈[-70,+54]mm · 13 of 31 slices shown, 17 images]
[im 3/31  brain]
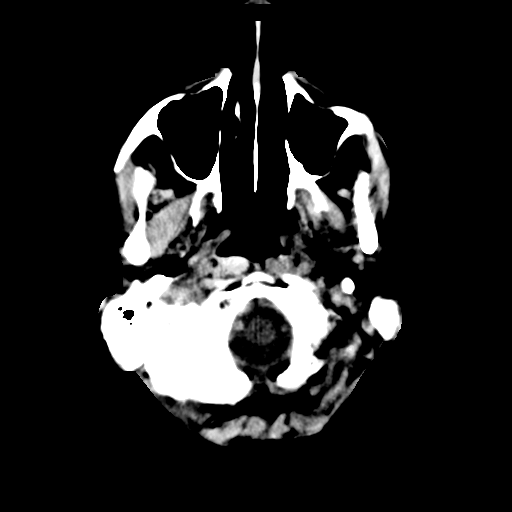
[im 3/31  bone]
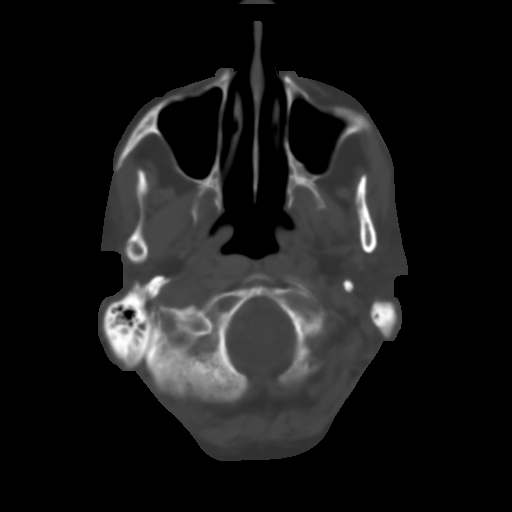
[im 5/31  brain]
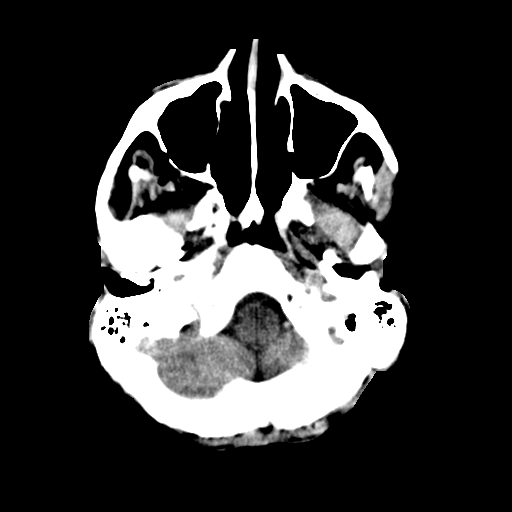
[im 7/31  brain]
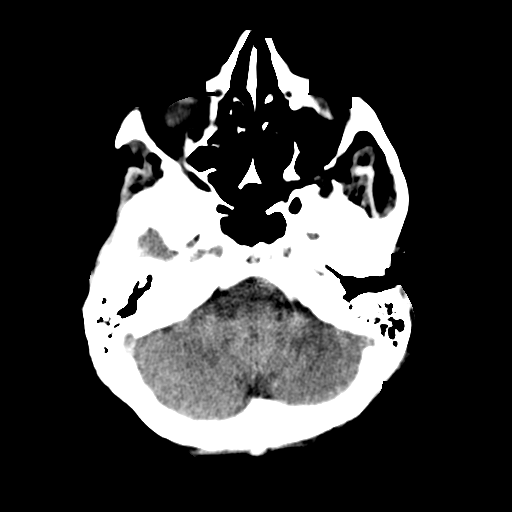
[im 9/31  brain]
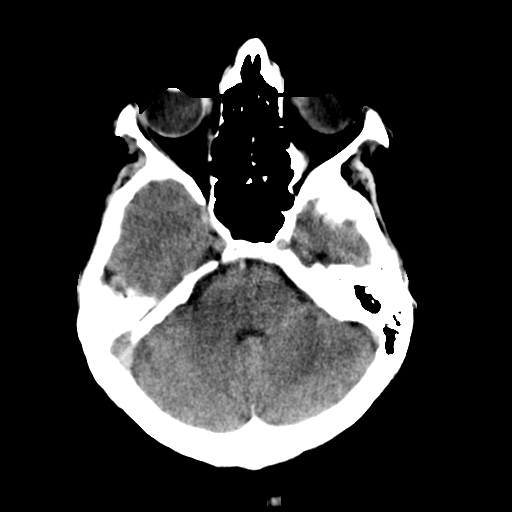
[im 11/31  brain]
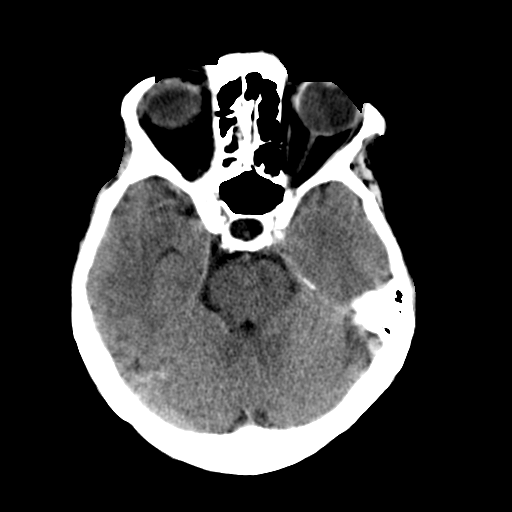
[im 11/31  bone]
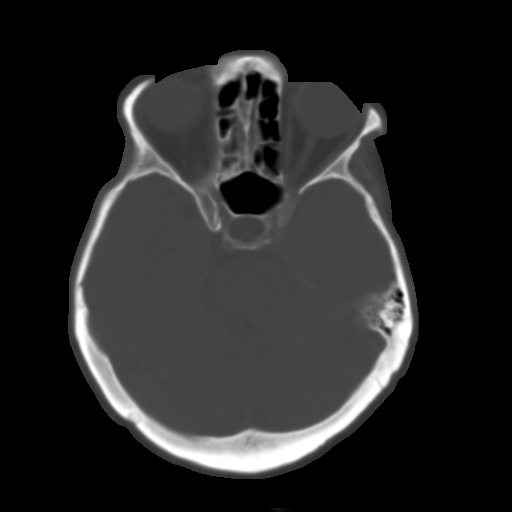
[im 13/31  brain]
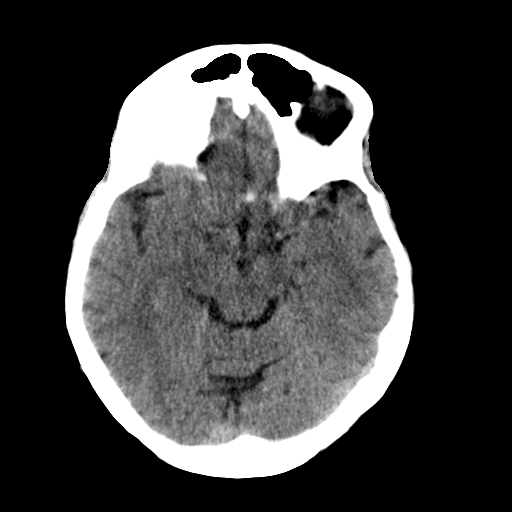
[im 16/31  brain]
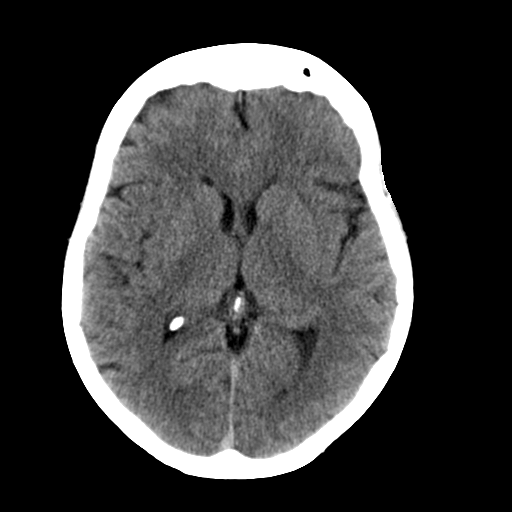
[im 18/31  brain]
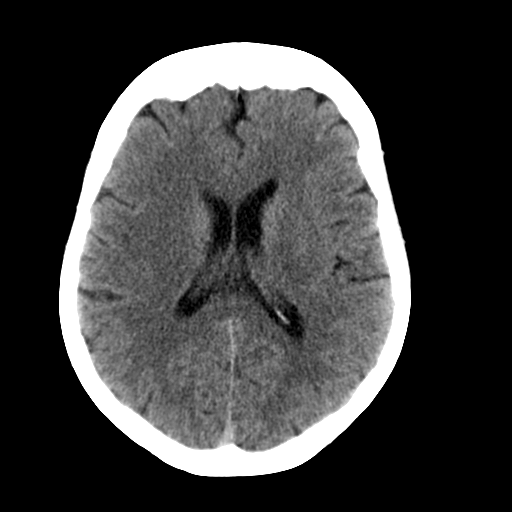
[im 20/31  brain]
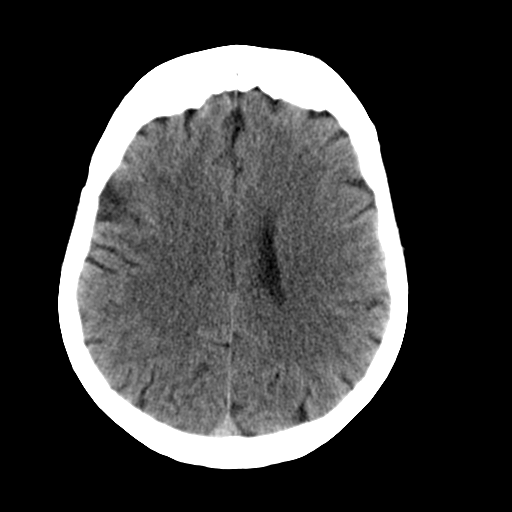
[im 20/31  bone]
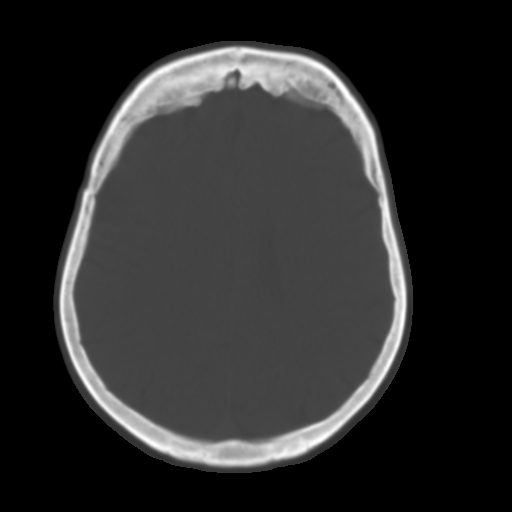
[im 22/31  brain]
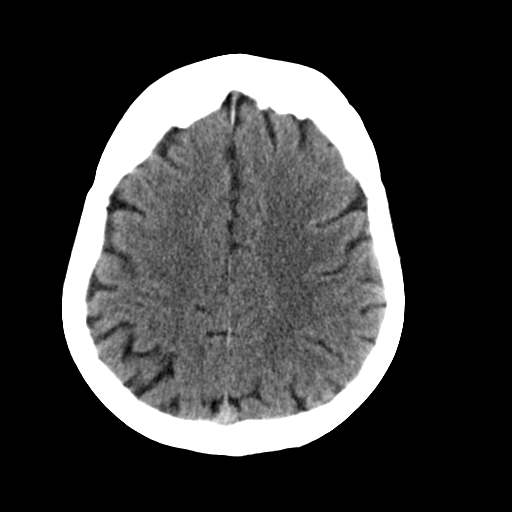
[im 24/31  brain]
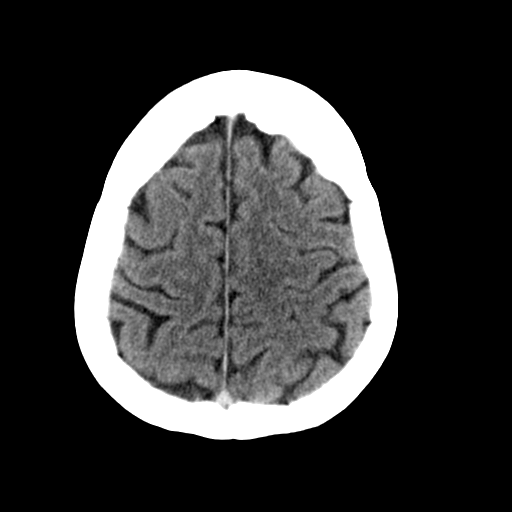
[im 26/31  brain]
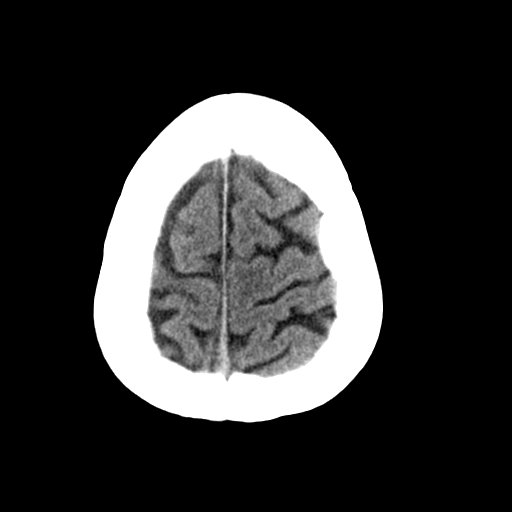
[im 28/31  brain]
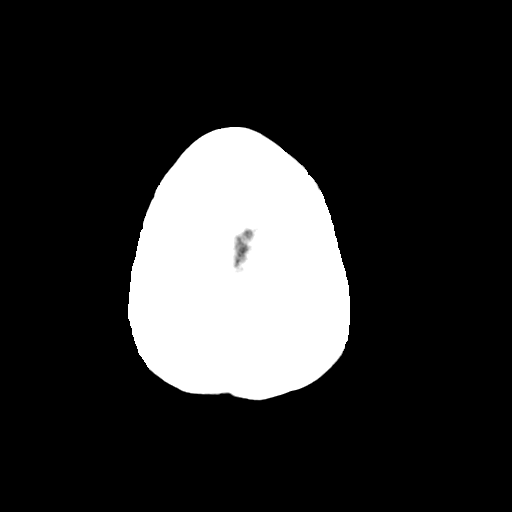
[im 28/31  bone]
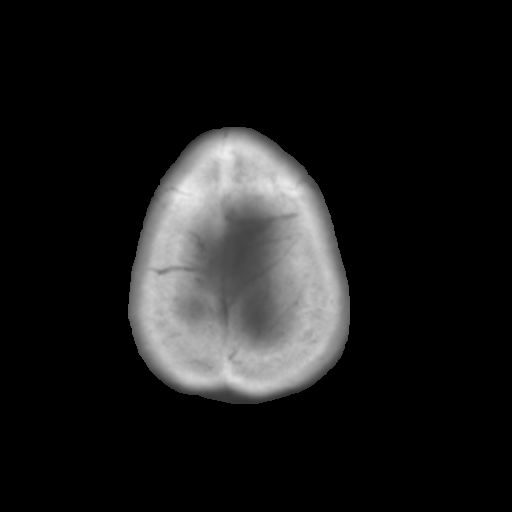

[Series 3: bone · axial · 0.39mm/px · z∈[-70,-30]mm · 3 of 31 slices shown]
[im 3/31  bone]
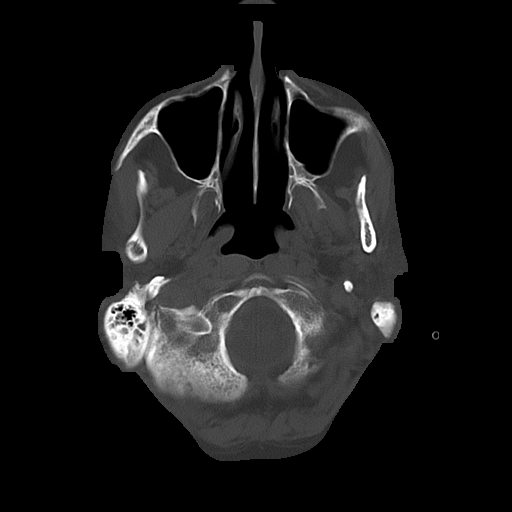
[im 7/31  bone]
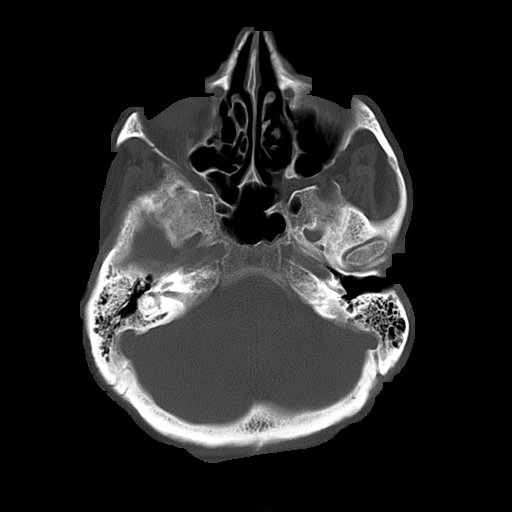
[im 11/31  bone]
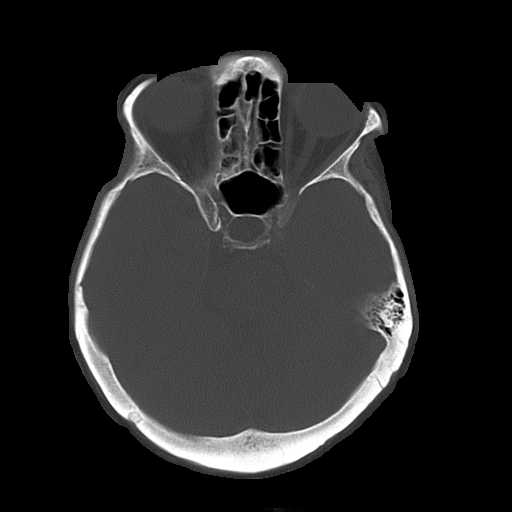

[16 of 30 positions shown; findings below may reference images not displayed]

PROCEDURE:     CT  - CT HEAD WITHOUT CONTRAST  - November 11, 2011 [DATE]

RESULT:     Axial noncontrast CT scanning was performed through the brain
with reconstructions at 5 mm intervals and slice thicknesses. Comparison
made to the study 13 May, 2011.

There is very mild cerebral atrophy which is stable. The ventricles are
normal in size and position. There is no intracranial hemorrhage nor
intracranial mass effect. The cerebellum and brainstem are normal in
density. There is no evidence of an evolving ischemic infarction. At bone
window settings the observed portions of the paranasal sinuses are clear.
There is no evidence of an acute skull fracture.
IMPRESSION: I see no acute intracranial abnormality.

A preliminary report was sent to the [HOSPITAL] the conclusion
of the study.

## 2013-10-04 IMAGING — US ABDOMEN ULTRASOUND LIMITED
1 series · 17 of 25 positions shown · non-contrast
Comparison: none

REASON FOR EXAM: nausea/vomiting, elevated liver enzymes
COMMENTS:   May transport without cardiac monitor

[Series 1: abdomen ultrasound limited · 17 of 37 slices shown]
[im 1/37]
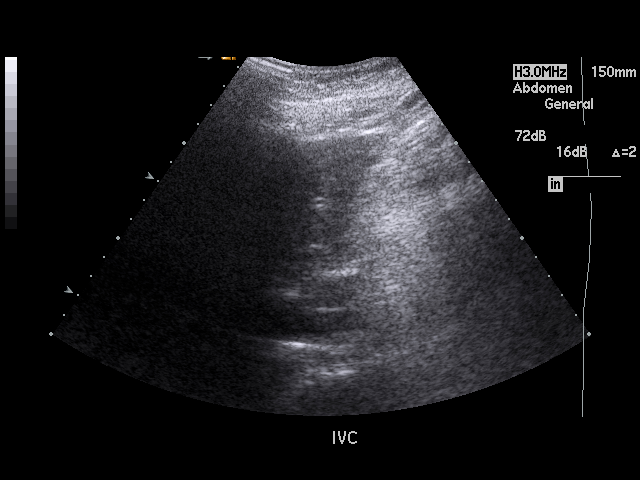
[im 4/37]
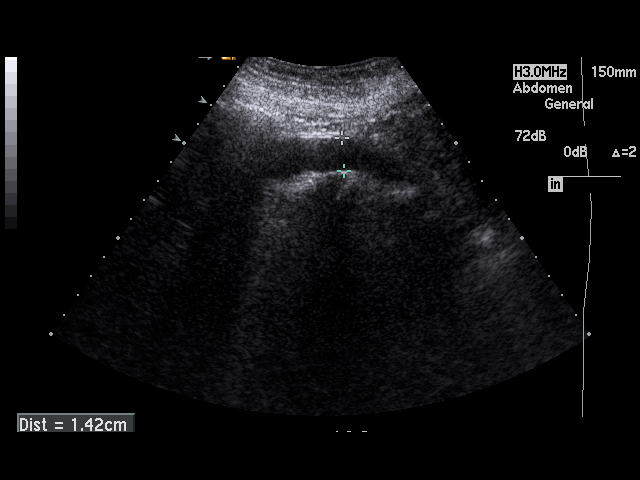
[im 5/37]
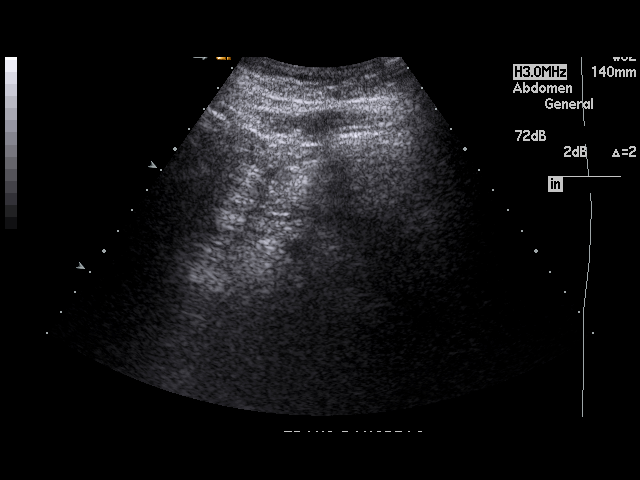
[im 8/37]
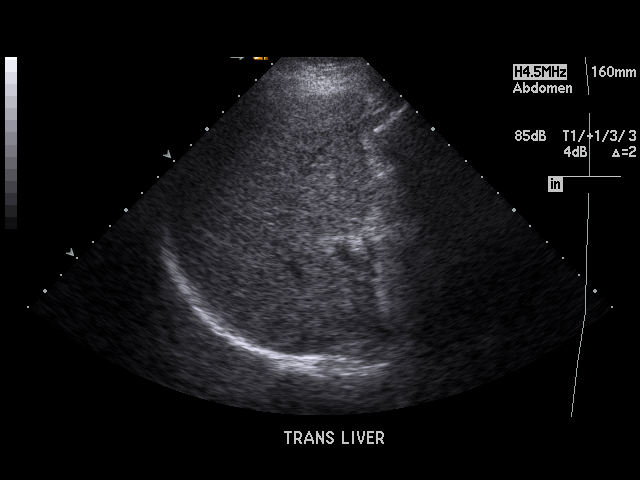
[im 10/37]
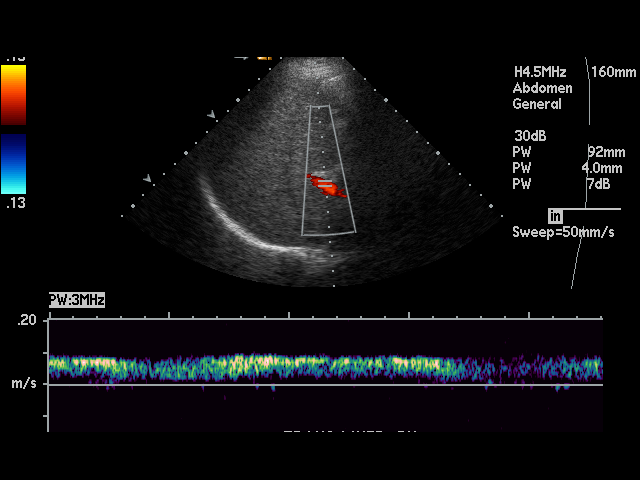
[im 13/37]
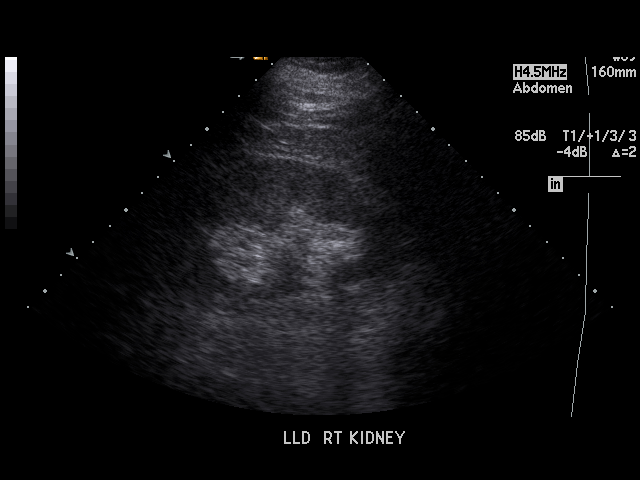
[im 14/37]
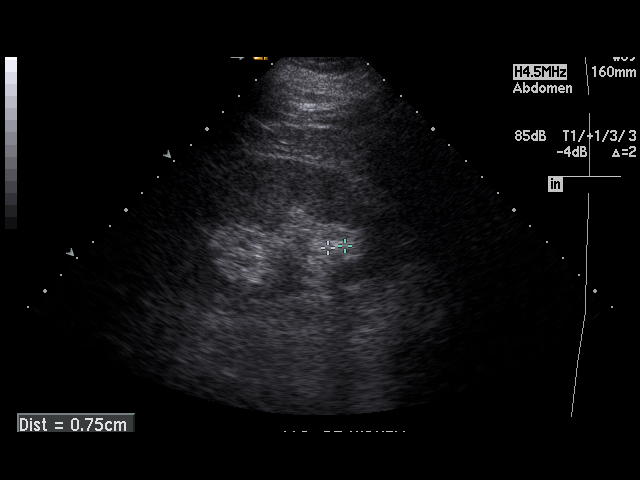
[im 17/37]
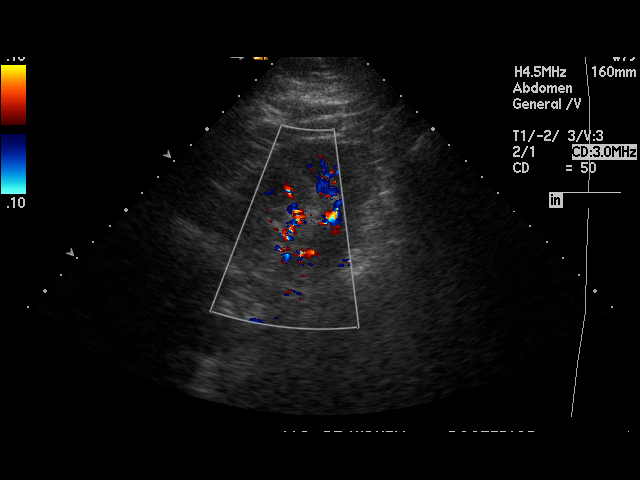
[im 19/37]
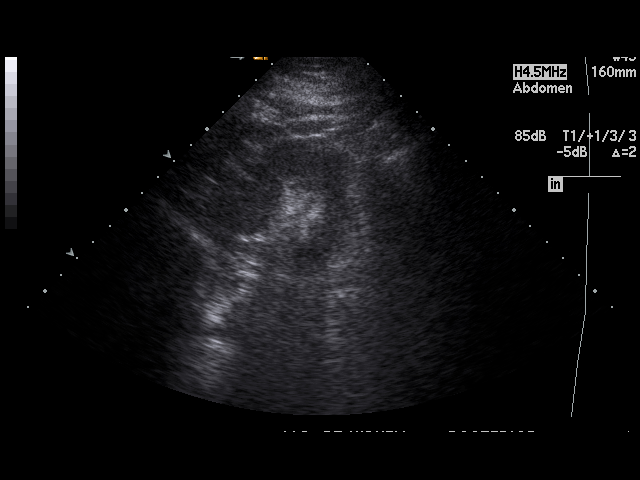
[im 20/37]
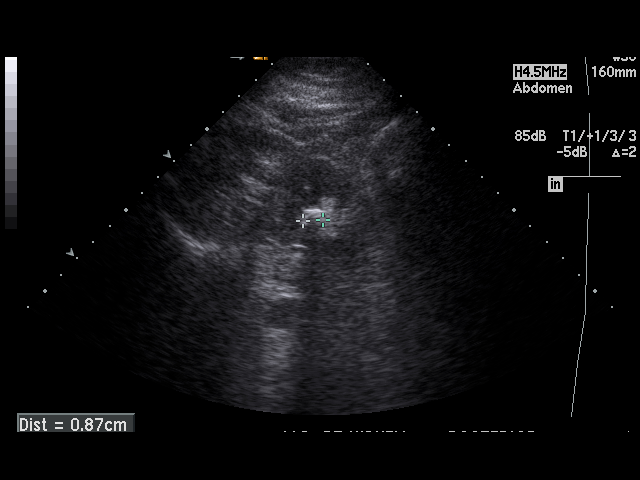
[im 23/37]
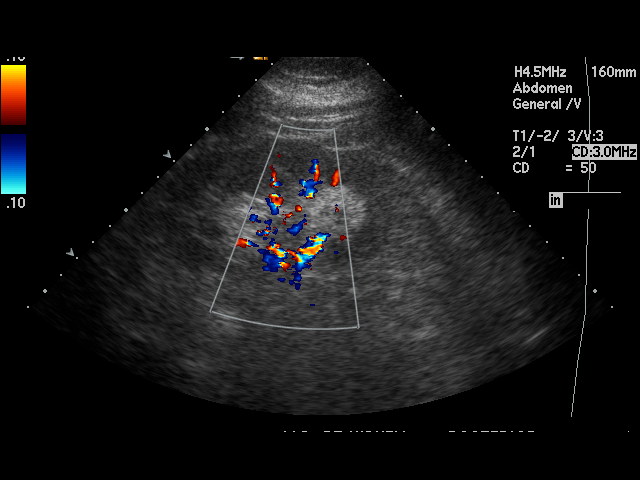
[im 25/37]
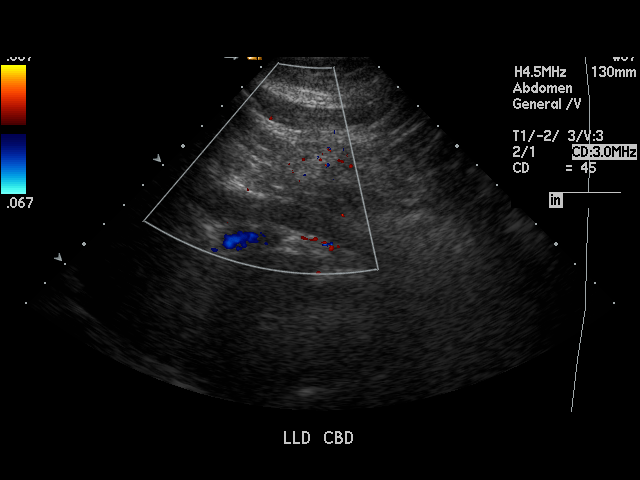
[im 28/37]
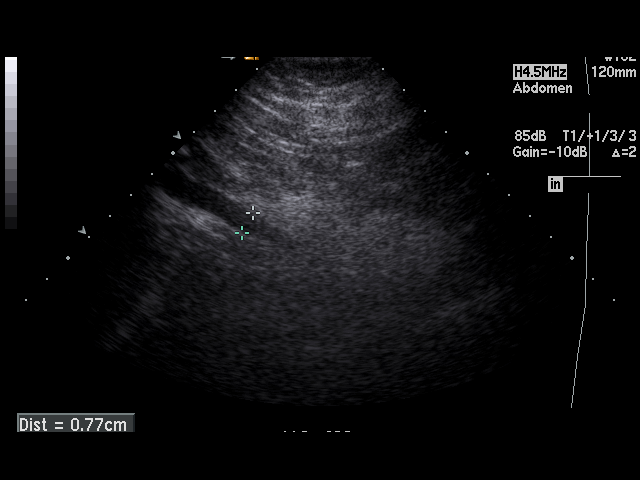
[im 29/37]
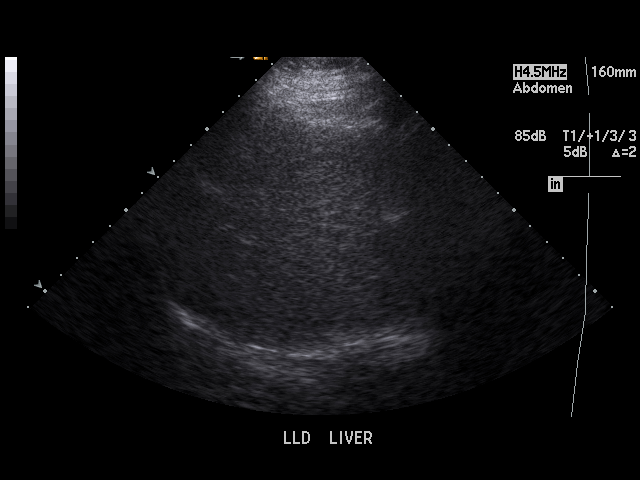
[im 32/37]
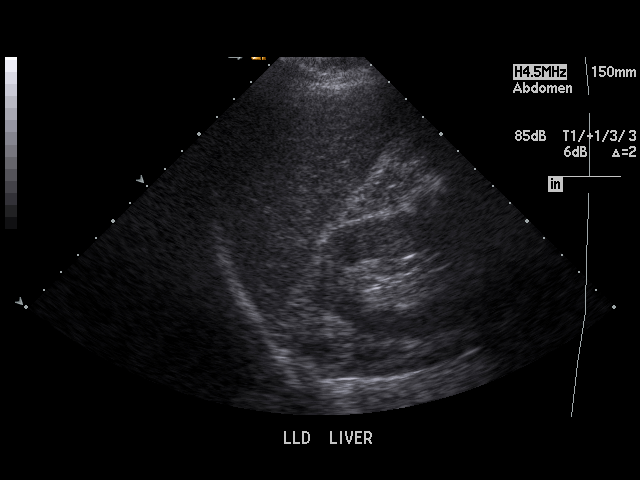
[im 34/37]
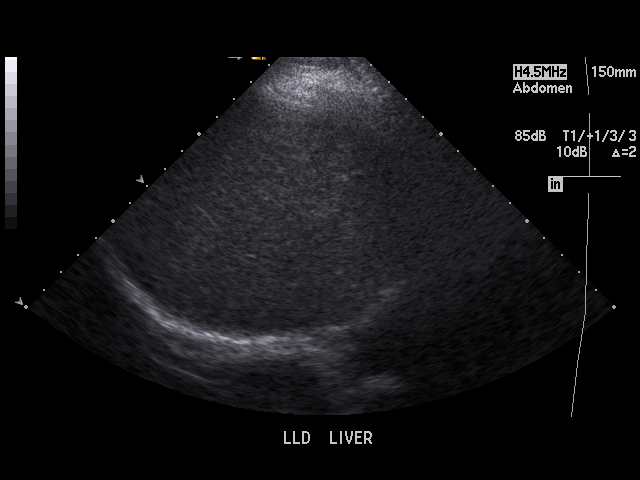
[im 37/37]
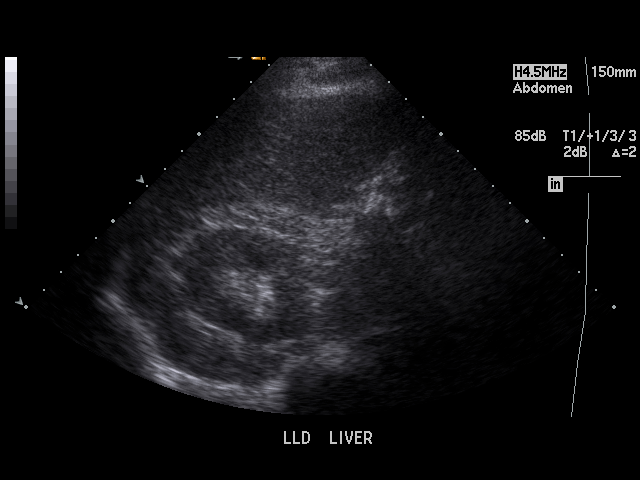

[17 of 25 positions shown; findings below may reference images not displayed]

PROCEDURE:     US  - US ABDOMEN LIMITED SURVEY  - November 11, 2011  [DATE]

RESULT:     A right upper quadrant abdominal ultrasound was performed.

The gallbladder is surgically absent. The common bile duct is normal at
cm in diameter which is not a new finding. The liver exhibits no focal mass
or ductal dilation. Portal venous flow is normal in direction toward the
liver. The abdominal aorta and inferior vena cava are normal in appearance.
The kidney exhibits no evidence of obstruction. There is an 8mm diameter
lower pole cyst on the right which has been previously described. The spleen
is surgically absent. The pancreas could not be demonstrated due to bowel
gas.
IMPRESSION: I do not see acute intra-abdominal abnormality on this
study. There is a nonobstructing stone in the right kidney. There is chronic
enlargement of the common bile duct.

A preliminary report was sent to the [HOSPITAL] the conclusion
of the study.

## 2013-12-07 IMAGING — CR DG CHEST 2V
2 series · 2 of 2 positions shown · non-contrast
Comparison: Portable chest x-ray of 10/26/2007

CLINICAL DATA: Cough, shortness of breath

CHEST - 2 VIEW

[w chest pa]
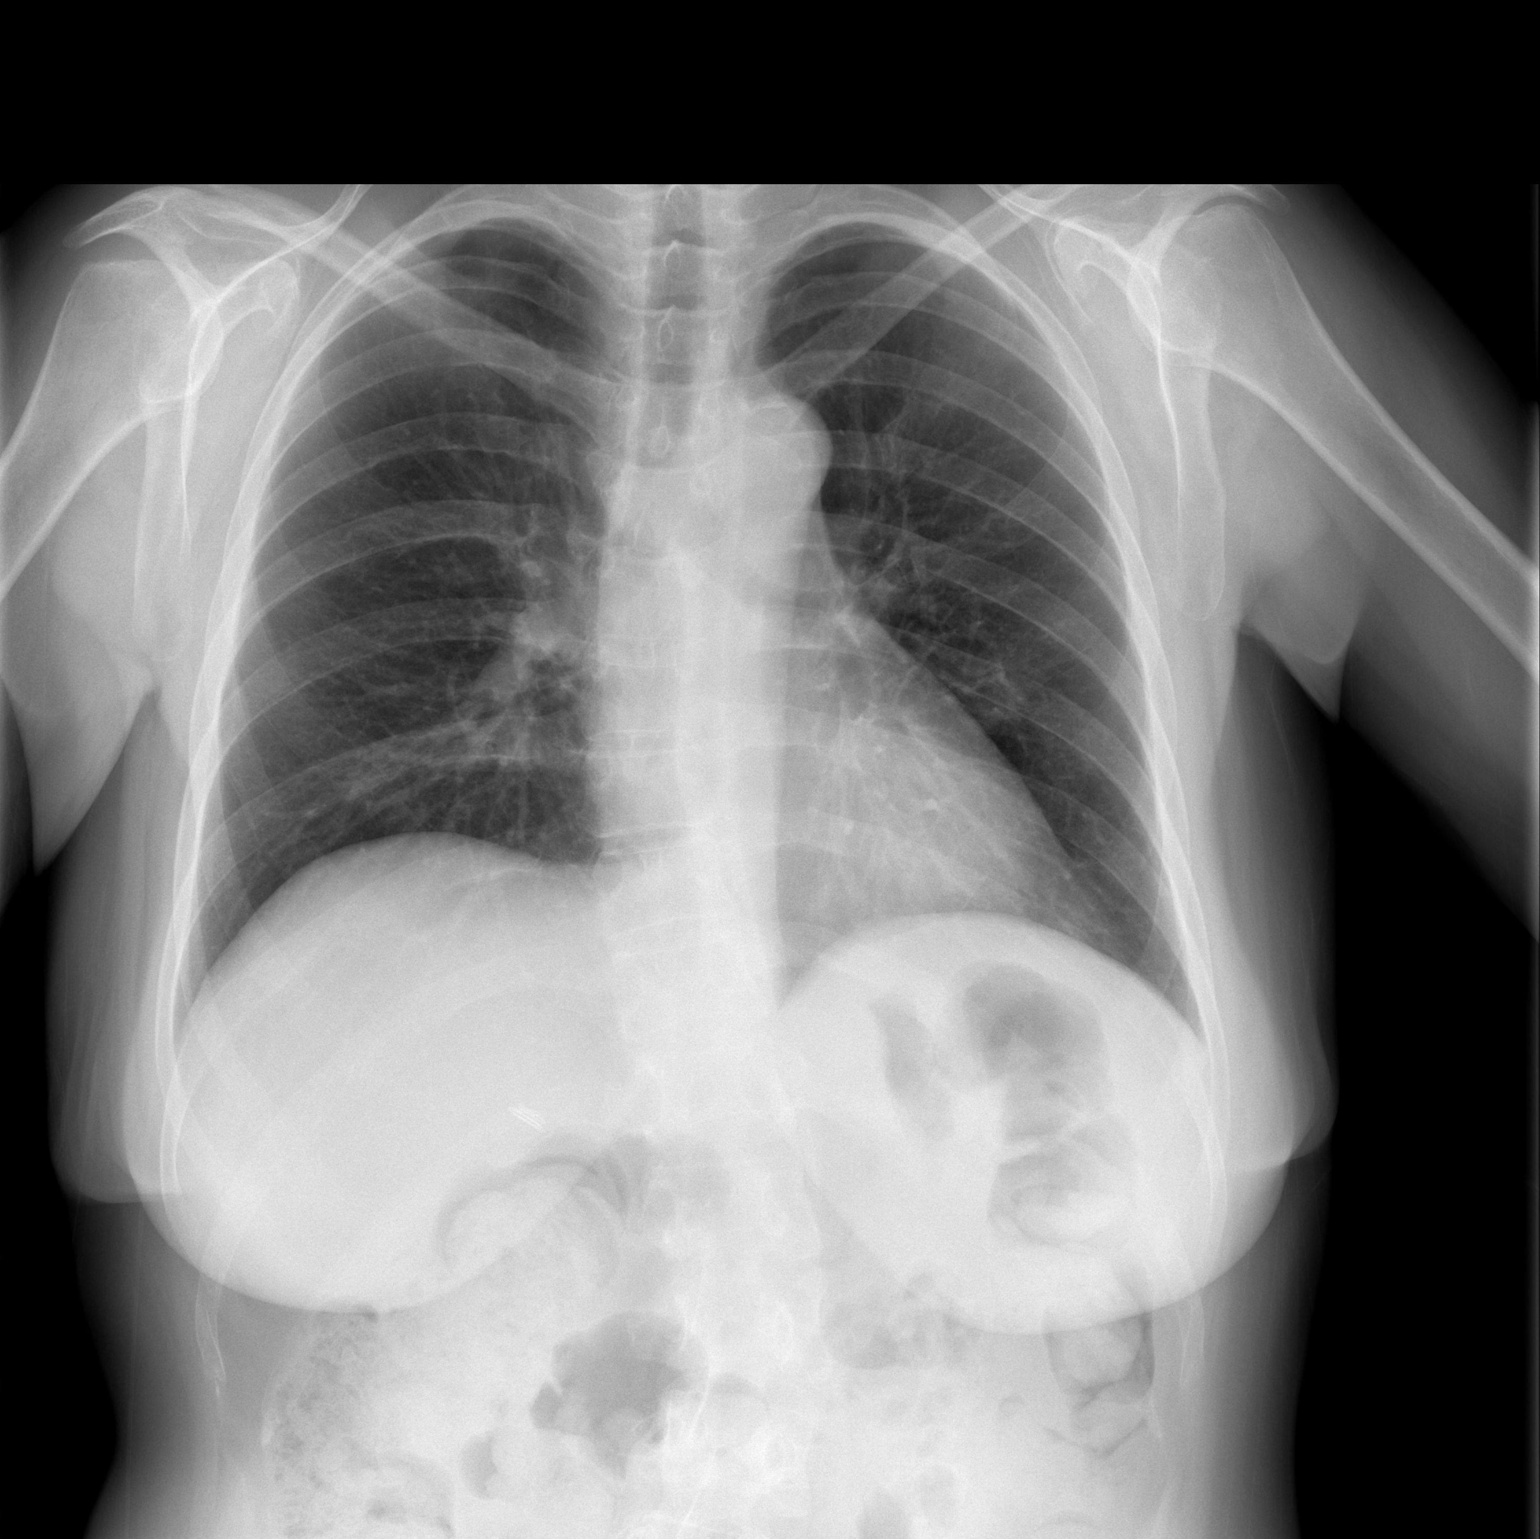

[w chest lat]
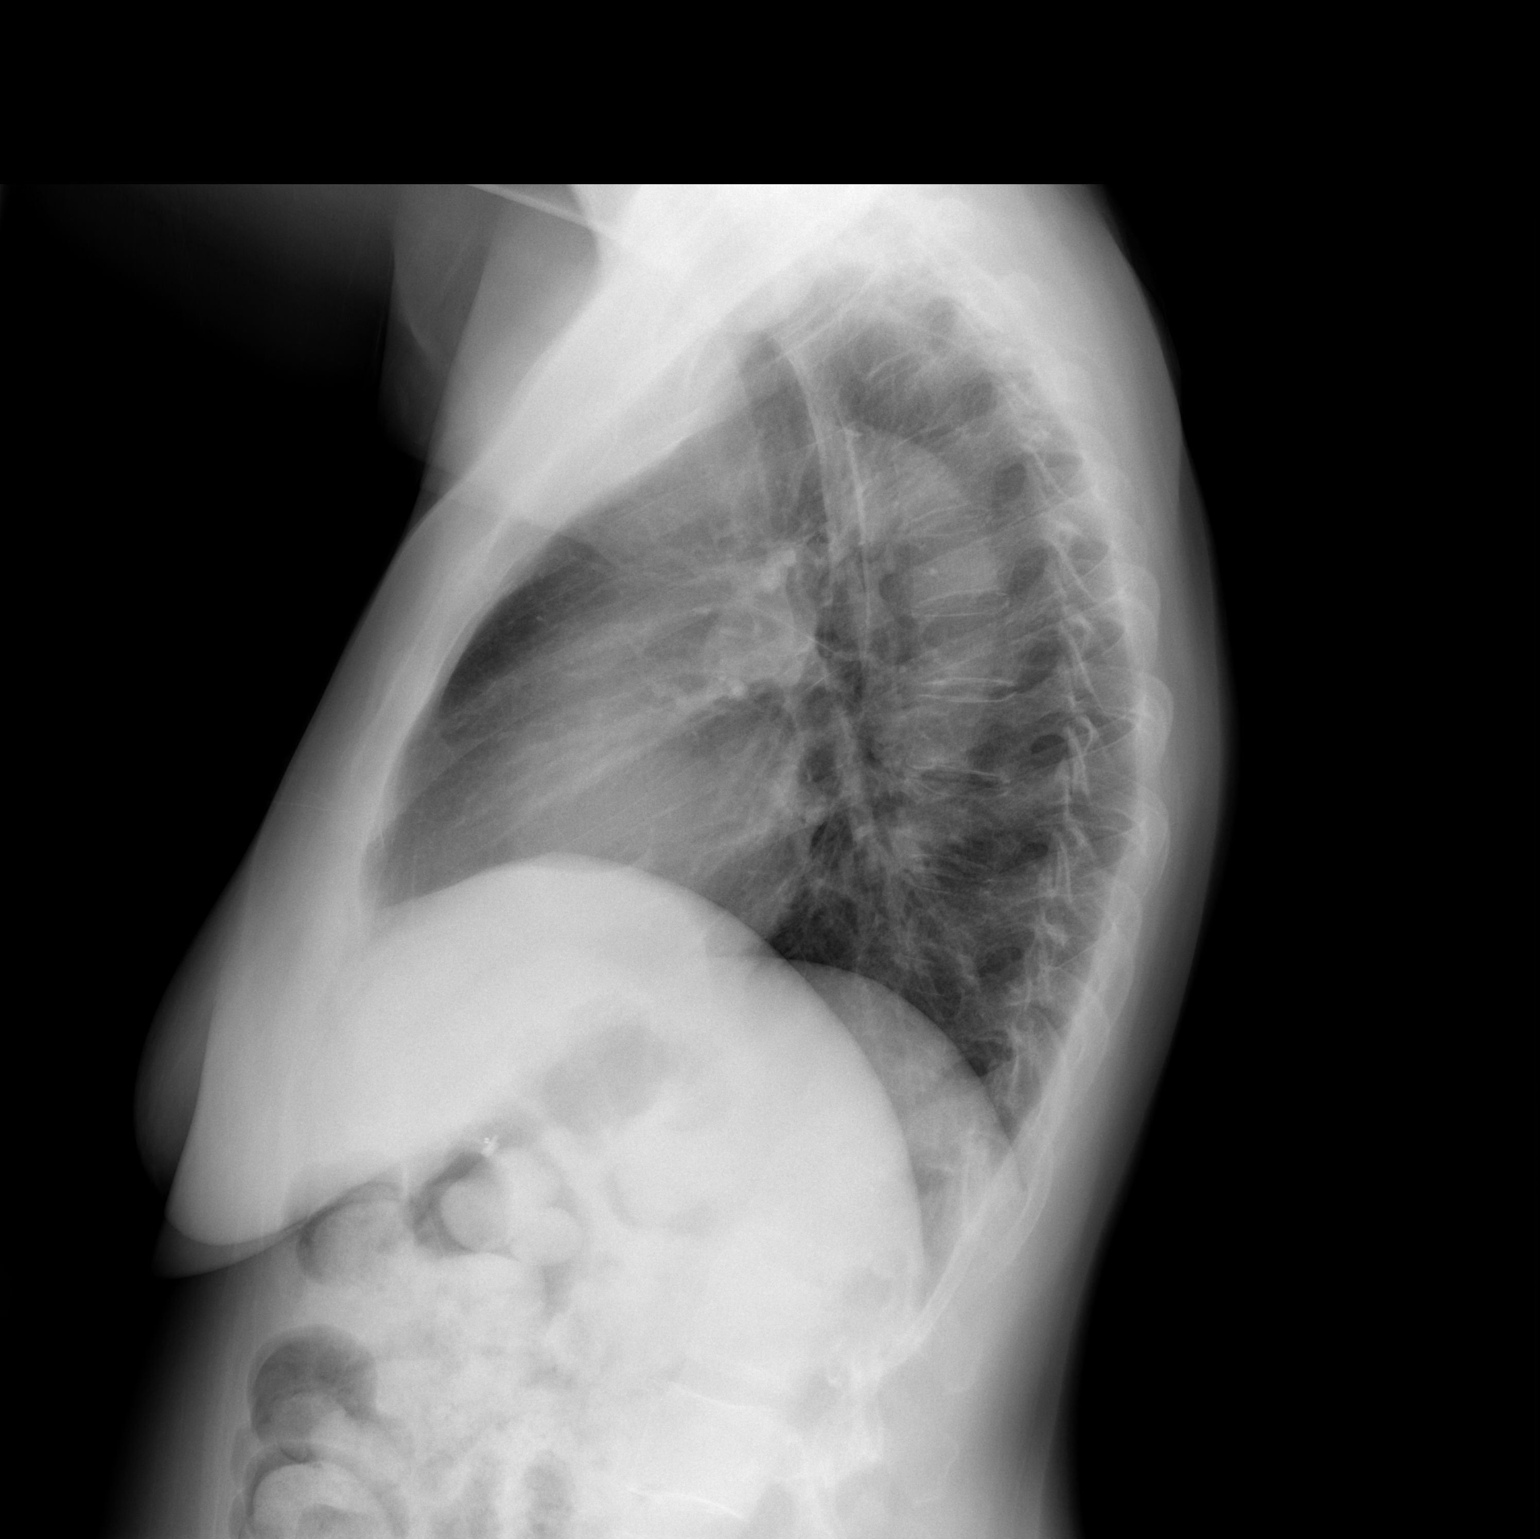

[2 of 2 positions shown; findings below may reference images not displayed]

FINDINGS: No active infiltrate or effusion is seen.  The heart is
within upper limits of normal.  No bony abnormality is noted.
Surgical clips are present in the right upper quadrant from prior
cholecystectomy.
IMPRESSION: No active lung disease.

## 2013-12-20 IMAGING — US ABDOMEN ULTRASOUND LIMITED
1 series · 14 of 25 positions shown · non-contrast
Comparison: none

REASON FOR EXAM: RUQ pain
COMMENTS:   Body Site: GB and Fossa, CBD, Head of Pancreas

PROCEDURE:     US  - US ABDOMEN LIMITED SURVEY  - January 28, 2012 [DATE]
RESULT:
HISTORY: Right upper quadrant pain.
Comparison Study: Ultrasound of the abdomen of 11/11/2011.

[Series 1: abdomen ultrasound limited · 0.30mm/px · 14 of 37 slices shown]
[im 1/37]
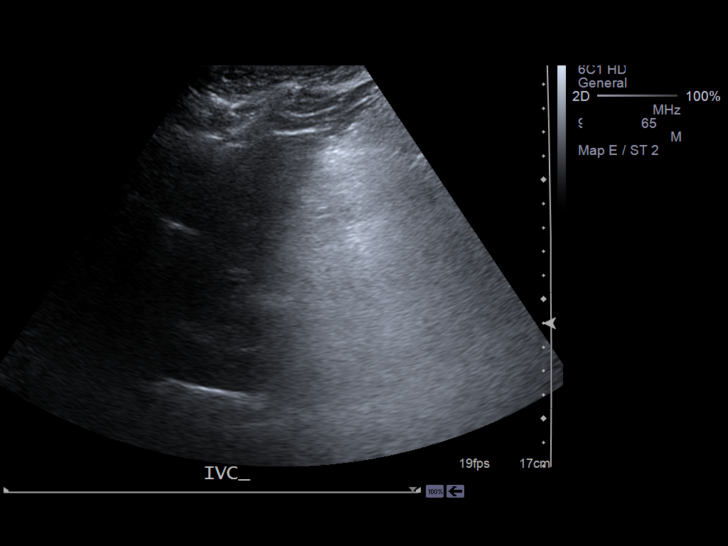
[im 4/37]
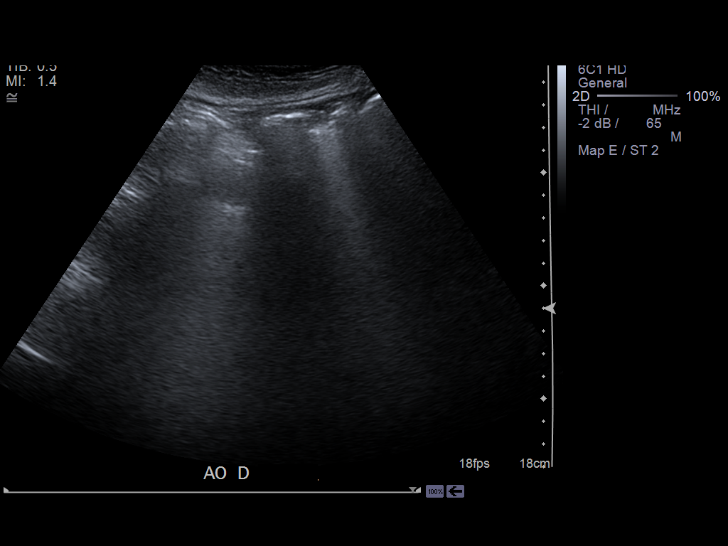
[im 7/37]
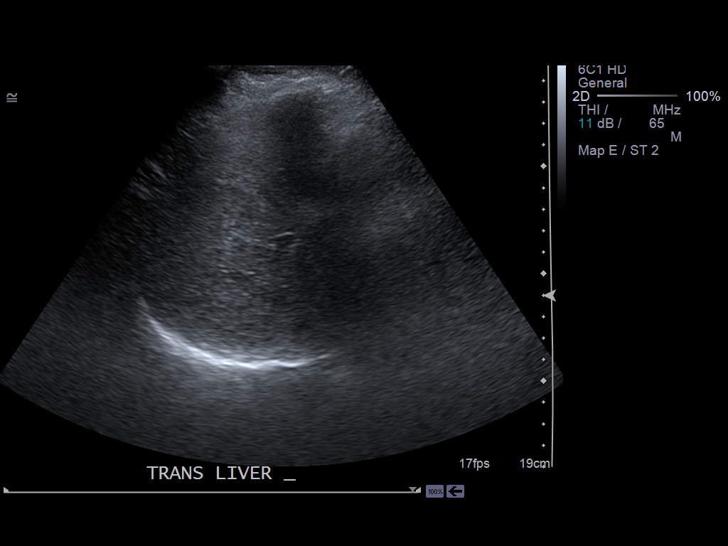
[im 10/37]
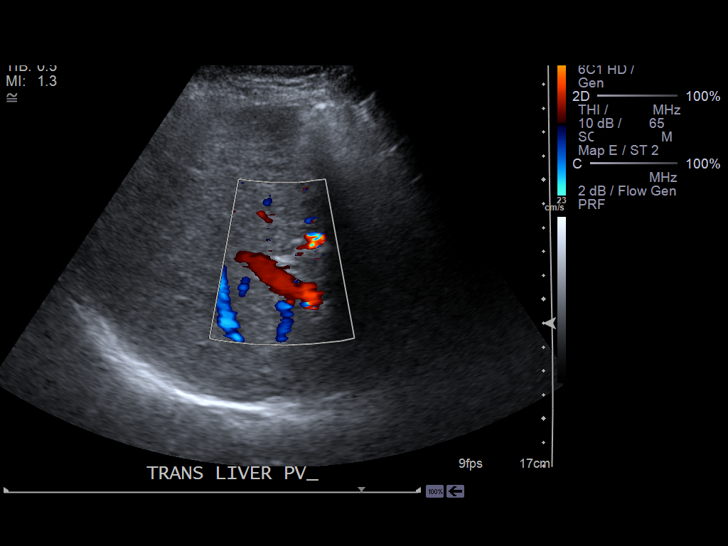
[im 13/37]
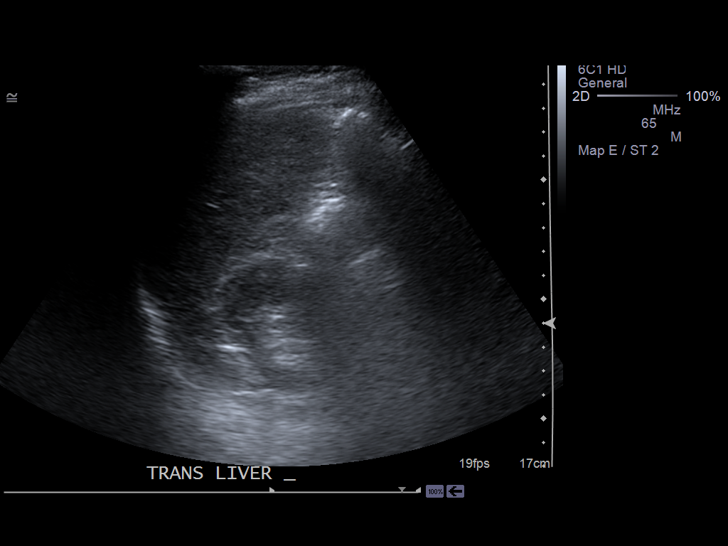
[im 14/37]
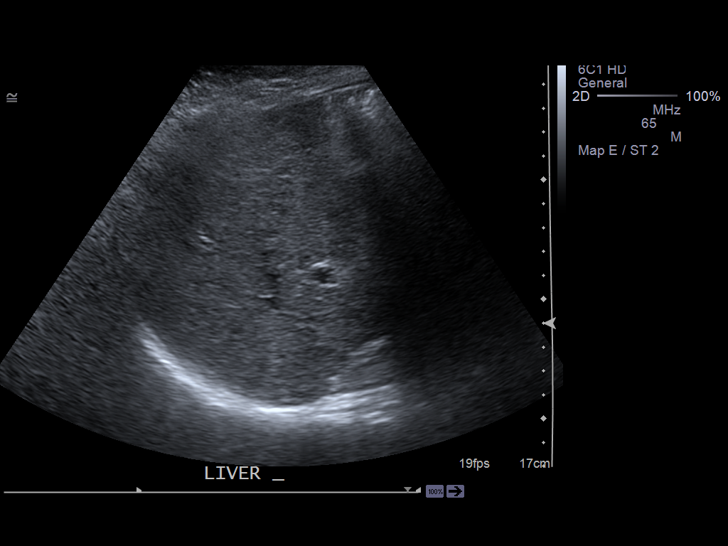
[im 17/37]
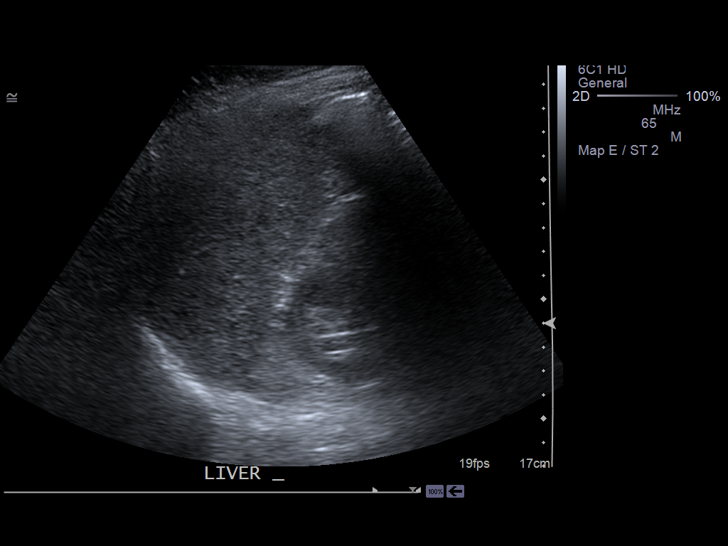
[im 20/37]
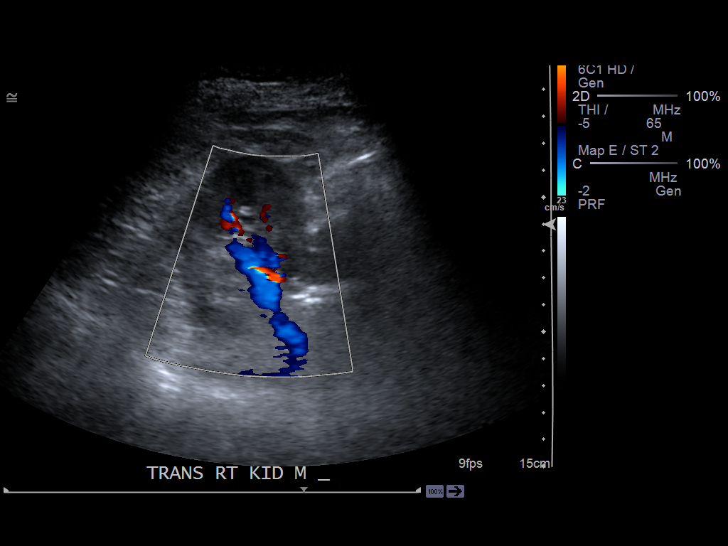
[im 23/37]
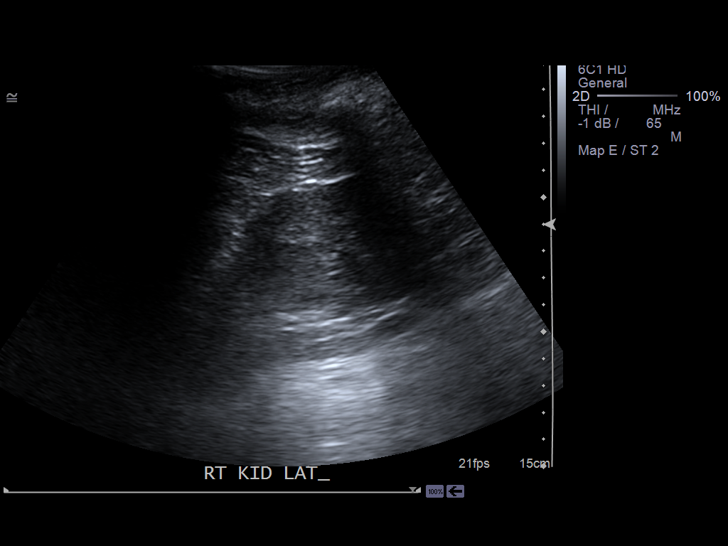
[im 25/37]
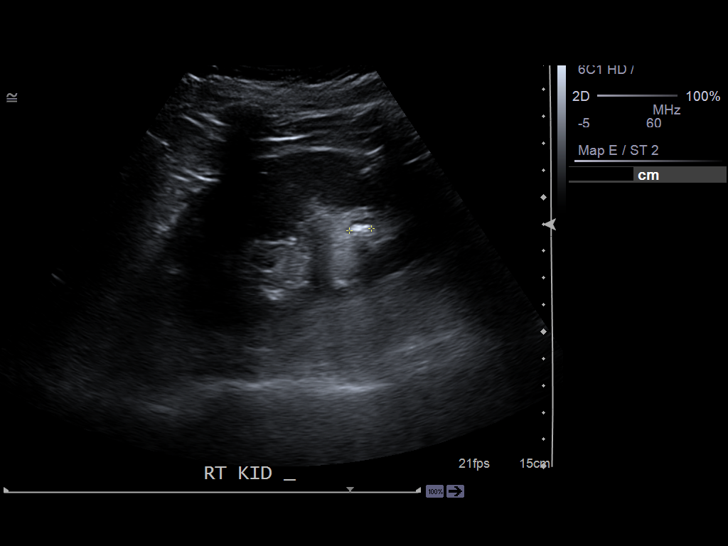
[im 28/37]
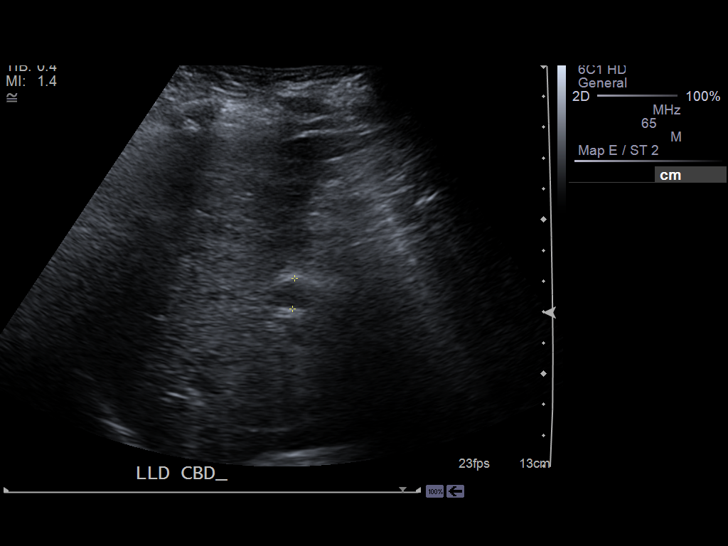
[im 31/37]
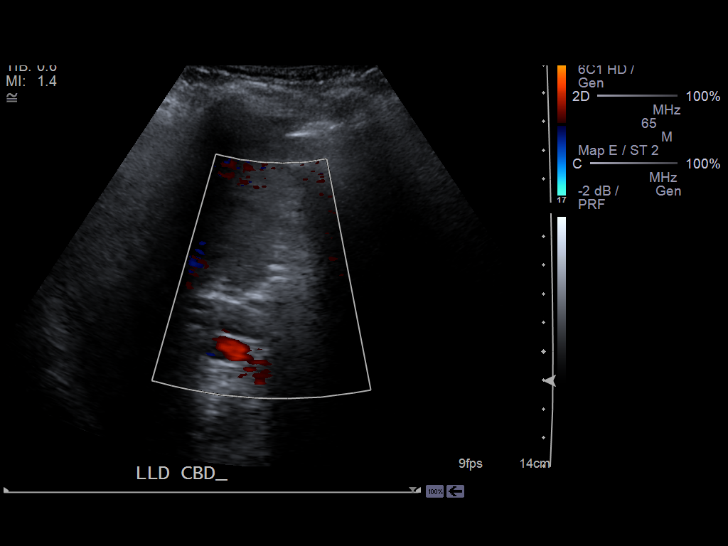
[im 34/37]
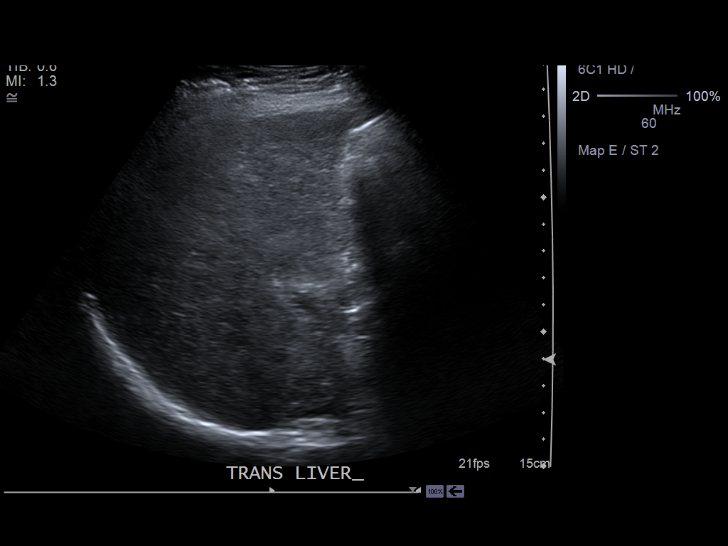
[im 37/37]
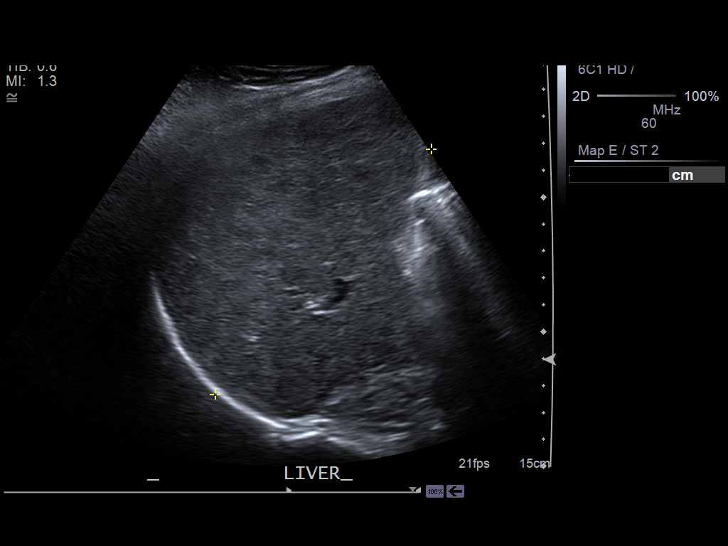

[14 of 25 positions shown; findings below may reference images not displayed]

FINDINGS: The liver is heterogeneous. CT can be obtained to exclude focal
hepatic disease including metastatic disease. The patient has had prior
cholecystectomy. Common bile duct is distended to 10.8 mm. This may be from
prior cholecystectomy. Right renal stone disease is noted. No evidence of
hydronephrosis. Normal portal venous flow is noted.
IMPRESSION: 1. Heterogeneous liver. This could be from a process such as fatty
infiltration; however, a more ominous process including metastatic disease
cannot be excluded and CT should be considered.
2. Prior cholecystectomy. Common bile duct is distended to 10.8 mm. This may
be from prior cholecystectomy.
3. Nonobstructing right renal stone.

## 2013-12-21 IMAGING — CR DG CHEST 1V PORT
1 series · 1 of 1 positions shown · non-contrast
Comparison: none

REASON FOR EXAM: VOMITING
COMMENTS:

[ap]
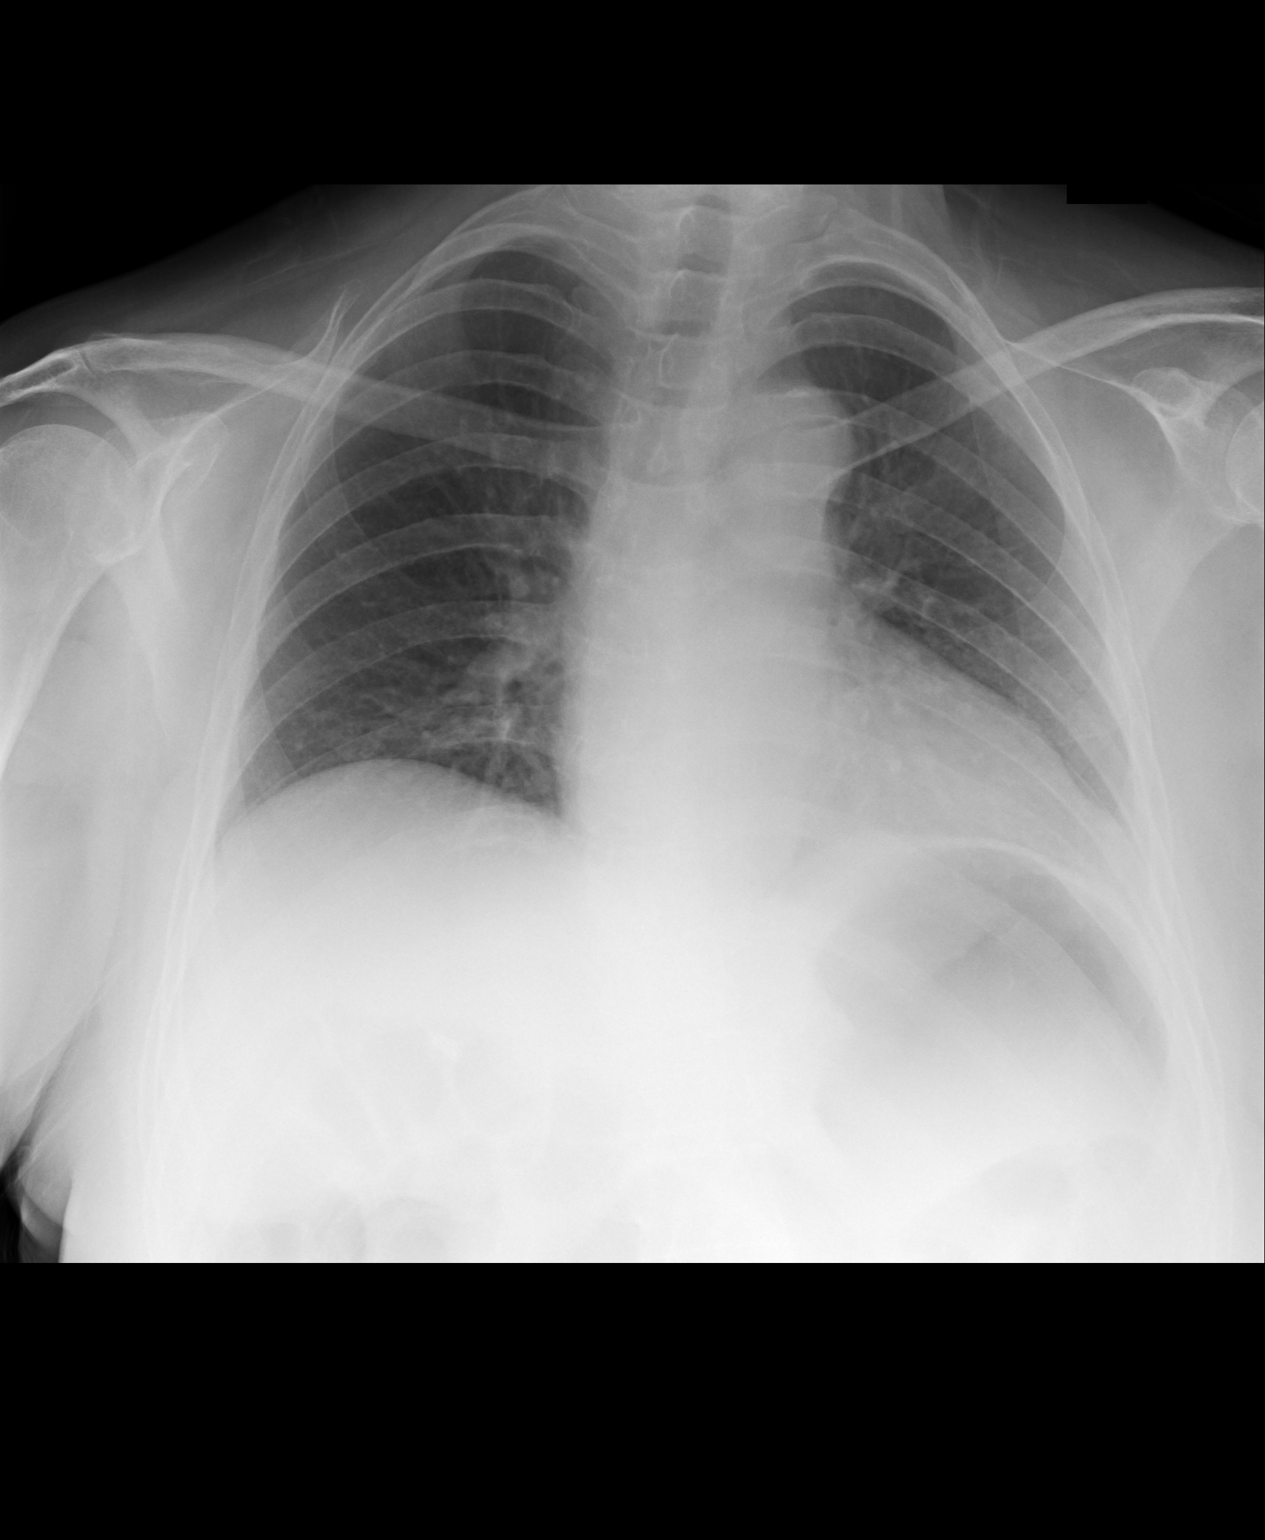

[1 of 1 positions shown; findings below may reference images not displayed]

PROCEDURE:     DXR - DXR PORTABLE CHEST SINGLE VIEW  - January 28, 2012  [DATE]

RESULT:     Comparison made to study 11 November, 2011.

The lungs are reasonably well inflated. Minimally increased lung markings
are noted at the lung bases. The cardiac silhouette is top normal in size.
The pulmonary vascularity is not engorged. There is gas within bowel and/or
the stomach in the upper abdomen.
IMPRESSION: I do not see evidence of pneumonia nor CHF. I cannot
exclude minimal bibasilar atelectasis.

## 2013-12-30 ENCOUNTER — Encounter: Payer: Self-pay | Admitting: Internal Medicine

## 2014-02-19 IMAGING — CT CT ABD-PELV W/ CM
1 of 2 series · 16 of 32 positions shown, 20 images · non-contrast
Comparison: none

REASON FOR EXAM: (1) LLQ pain, hx of diverticulitis; (2) llq pain, hx of
diverticulitis
COMMENTS:

PROCEDURE:     CT  - CT ABDOMEN / PELVIS  W  - March 28, 2012  [DATE]
RESULT:     History: Pain.
Comparison Study: CT of 08/06/2011.

[Series 2: 3mm soft tissue · axial · 0.70mm/px · z∈[-413,+19]mm · 16 of 158 slices shown, 20 images]
[im 7/158  soft-tissue]
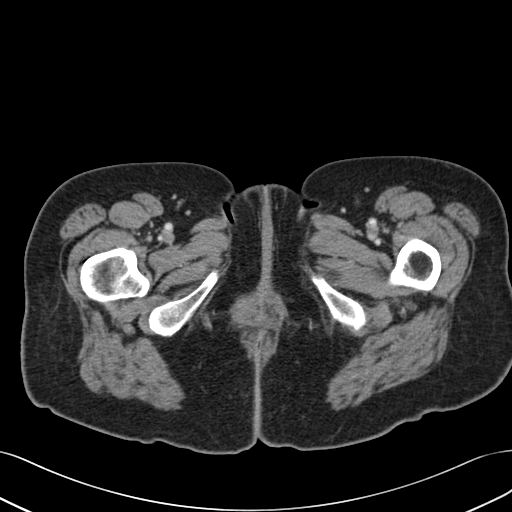
[im 7/158  bone]
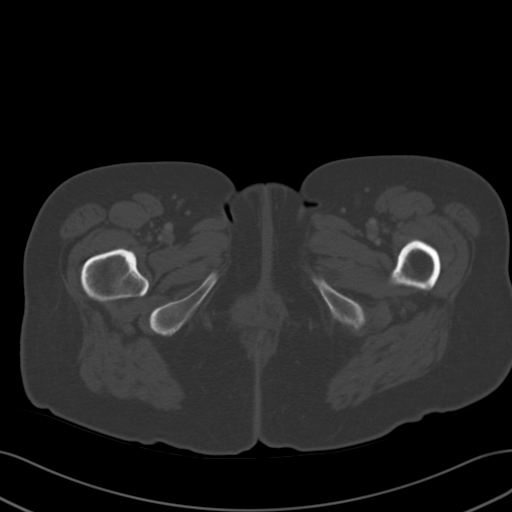
[im 19/158  soft-tissue]
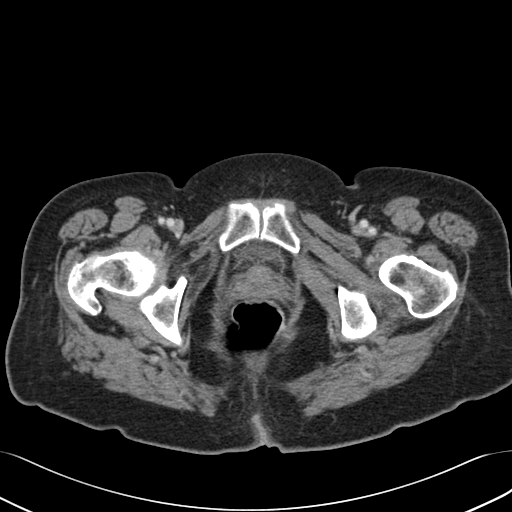
[im 32/158  soft-tissue]
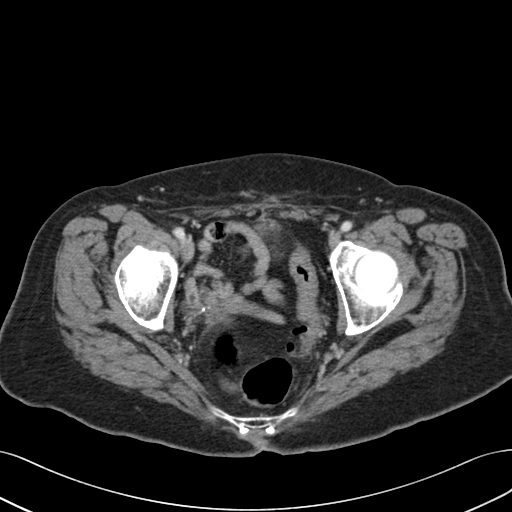
[im 44/158  soft-tissue]
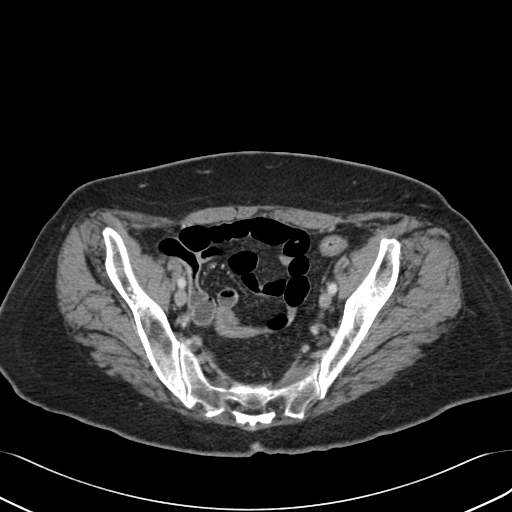
[im 51/158  soft-tissue]
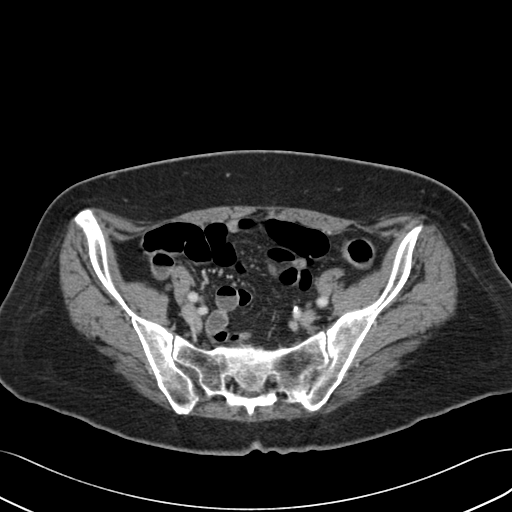
[im 63/158  soft-tissue]
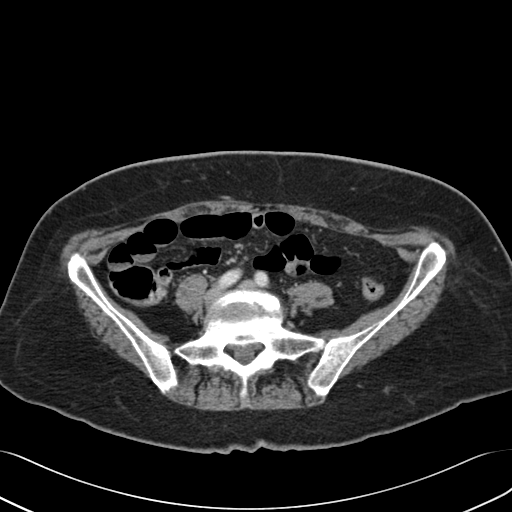
[im 76/158  soft-tissue]
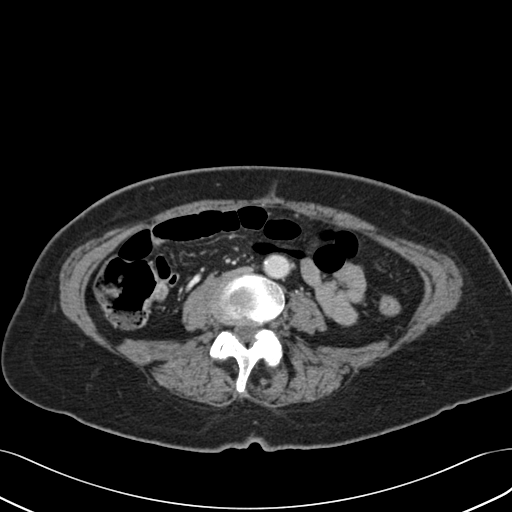
[im 82/158  soft-tissue]
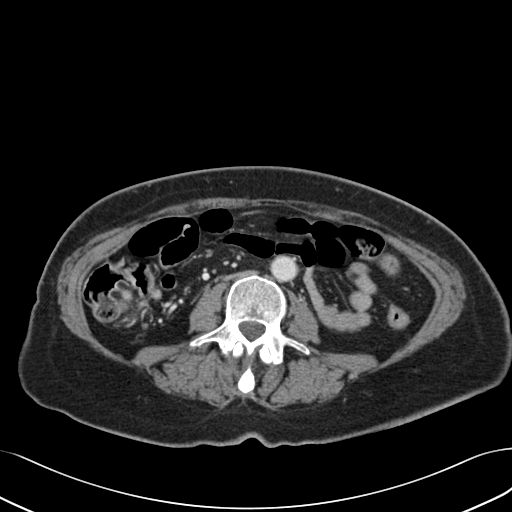
[im 95/158  soft-tissue]
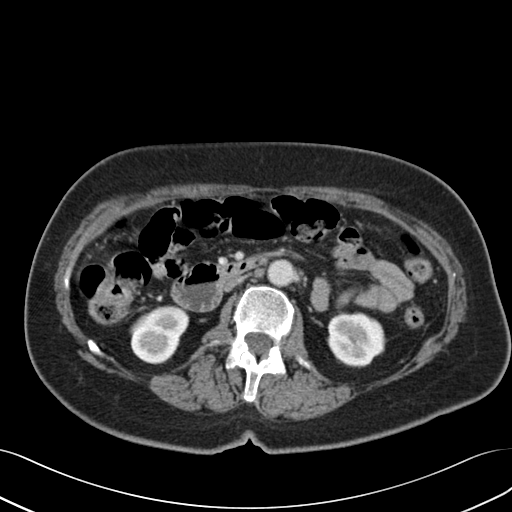
[im 95/158  bone]
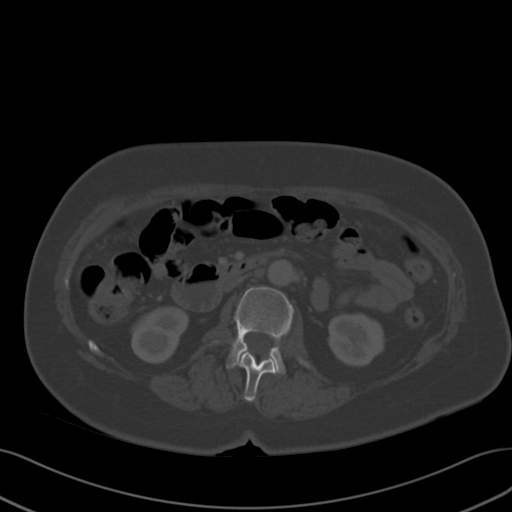
[im 107/158  soft-tissue]
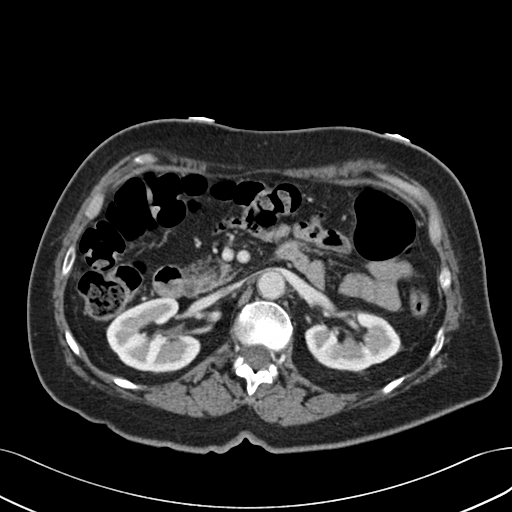
[im 120/158  soft-tissue]
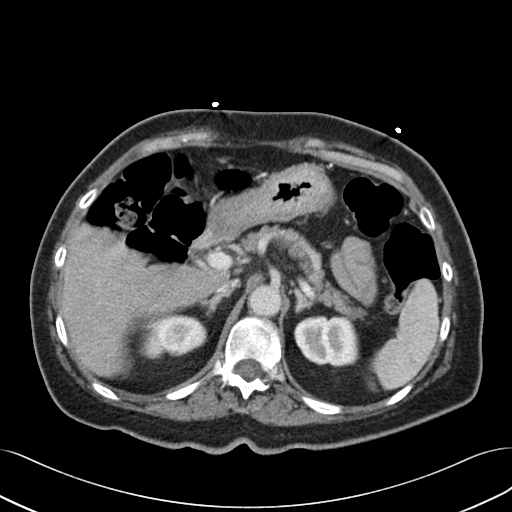
[im 126/158  soft-tissue]
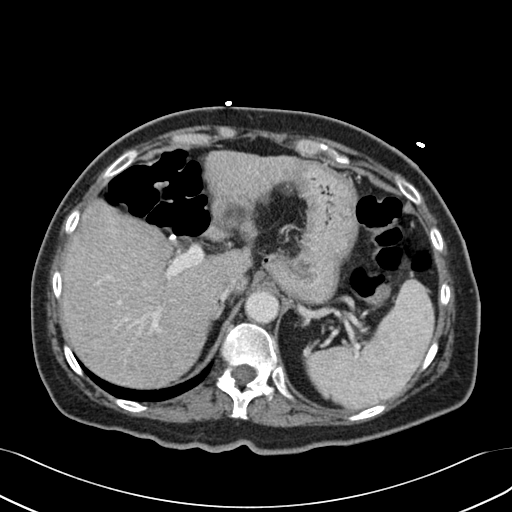
[im 132/158  lung]
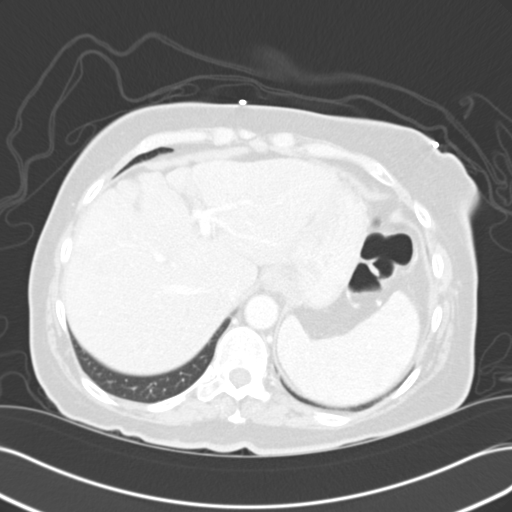
[im 139/158  soft-tissue]
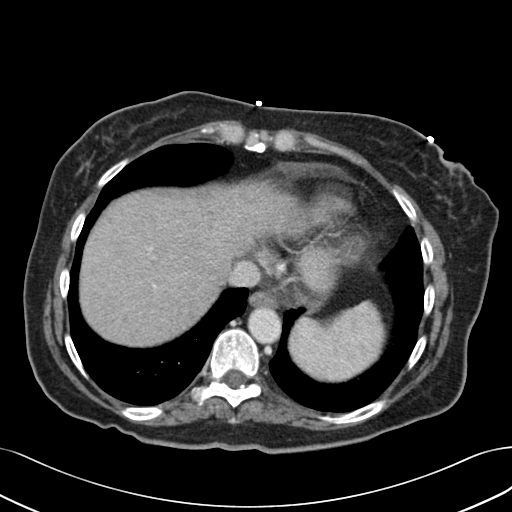
[im 139/158  lung]
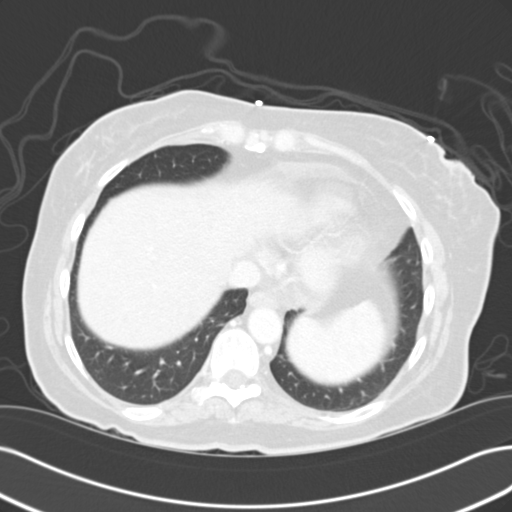
[im 145/158  lung]
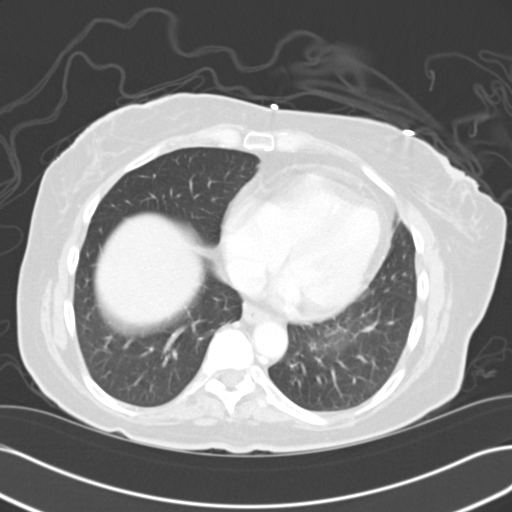
[im 151/158  soft-tissue]
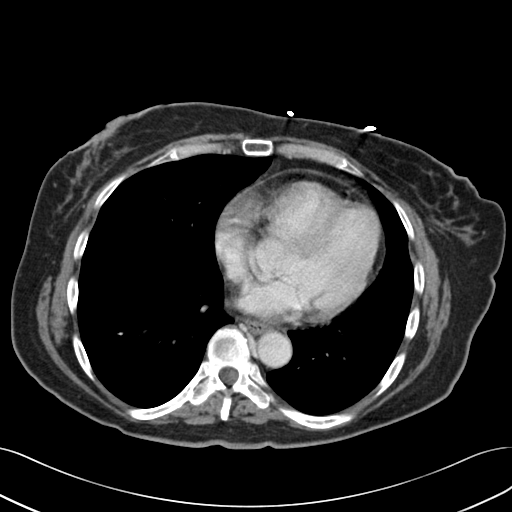
[im 151/158  lung]
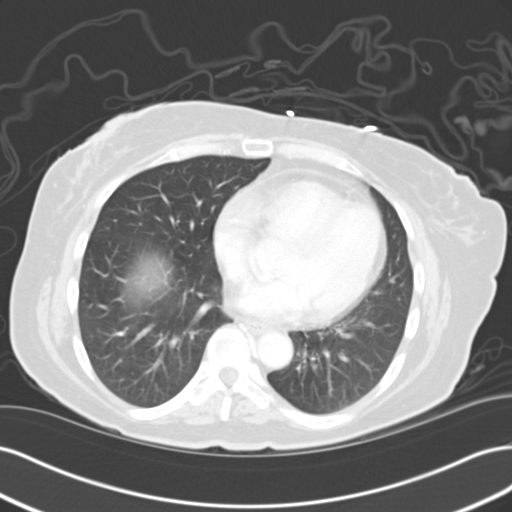

[16 of 32 positions shown; findings below may reference images not displayed]

FINDINGS: Standard CT obtained with 75 cc of Qsovue-3PQ. Liver normal. Prior
cholecystectomy. Spleen normal. Pancreas normal. Adrenals normal. Right
nephrolithiasis. No hydronephrosis. Aorta nondistended. Appendix normal. No
bowel distention or free air. Lung bases clear. Aorta nondistended. Bladder
nondistended.
IMPRESSION: Right nephrolithiasis.

## 2014-04-19 DIAGNOSIS — R1310 Dysphagia, unspecified: Secondary | ICD-10-CM | POA: Insufficient documentation

## 2014-04-27 ENCOUNTER — Ambulatory Visit: Payer: Self-pay | Admitting: Gastroenterology

## 2014-04-30 ENCOUNTER — Ambulatory Visit: Payer: Self-pay | Admitting: Gastroenterology

## 2014-05-11 DIAGNOSIS — Z9889 Other specified postprocedural states: Secondary | ICD-10-CM | POA: Insufficient documentation

## 2014-05-28 ENCOUNTER — Encounter: Payer: Self-pay | Admitting: Internal Medicine

## 2014-08-23 IMAGING — CR DG CHEST 1V PORT
1 series · 1 of 1 positions shown · non-contrast
Comparison: none

REASON FOR EXAM: UPRIGHT; N/V/CP
COMMENTS:

[ap]
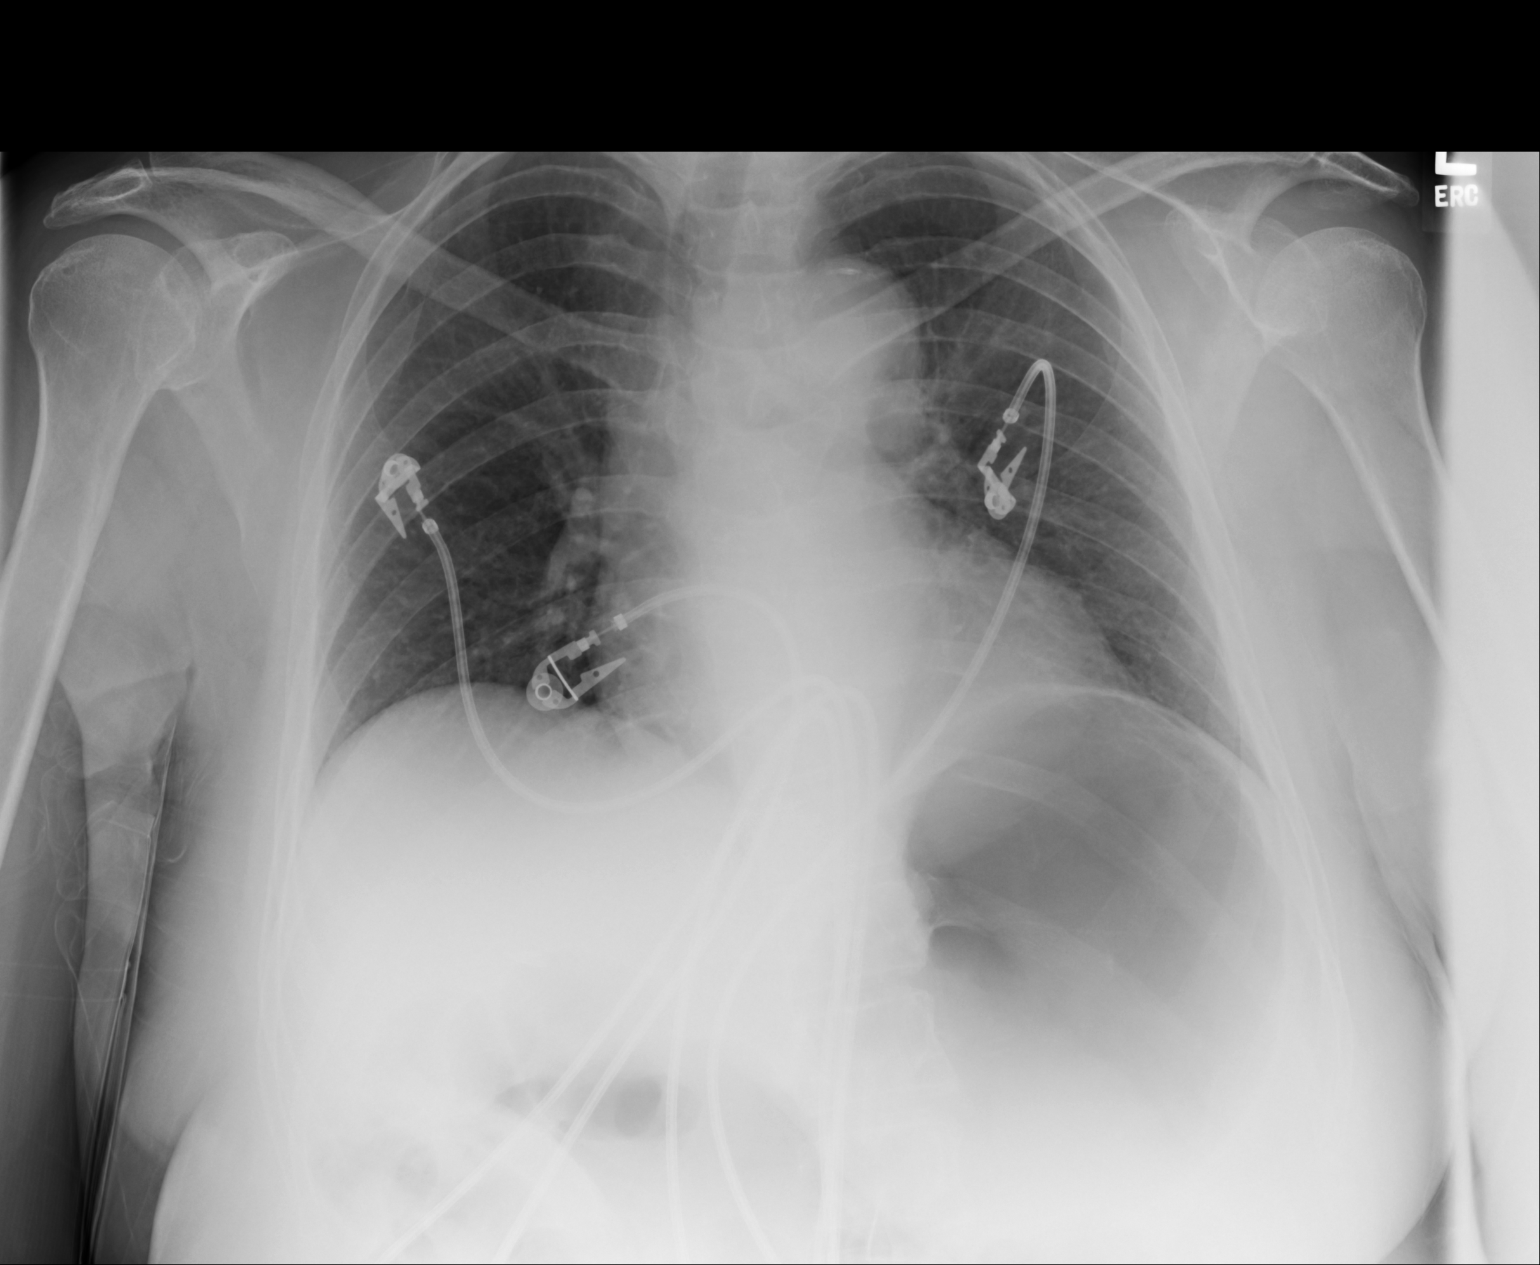

[1 of 1 positions shown; findings below may reference images not displayed]

PROCEDURE:     DXR - DXR PORTABLE CHEST SINGLE VIEW  - September 29, 2012 [DATE]

RESULT:     Cardiac monitoring electrodes are present. There shallow
inspiration effort. There appears to be a large amount of air under the left
hemidiaphragm, presumably in the stomach or loop of bowel. There is no
infiltrate, edema, effusion, mass or pneumothorax. Monitoring electrodes
present.
IMPRESSION: 1. Shallow inspiration.
2. No acute cardiopulmonary disease evident.

[REDACTED]

## 2014-09-10 DIAGNOSIS — W19XXXA Unspecified fall, initial encounter: Secondary | ICD-10-CM | POA: Diagnosis present

## 2014-09-10 DIAGNOSIS — K7460 Unspecified cirrhosis of liver: Secondary | ICD-10-CM

## 2014-09-10 HISTORY — DX: Unspecified cirrhosis of liver: K74.60

## 2014-10-08 ENCOUNTER — Inpatient Hospital Stay: Payer: Self-pay | Admitting: Internal Medicine

## 2014-10-08 LAB — PHOSPHORUS: Phosphorus: 2 mg/dL — ABNORMAL LOW (ref 2.5–4.9)

## 2014-10-08 LAB — URINALYSIS, COMPLETE
Bacteria: NONE SEEN
Bilirubin,UR: NEGATIVE
Glucose,UR: >=500 mg/dL (ref 0–75)
Leukocyte Esterase: NEGATIVE
NITRITE: NEGATIVE
Ph: 6 (ref 4.5–8.0)
RBC,UR: 4 /HPF (ref 0–5)
SPECIFIC GRAVITY: 1.033 (ref 1.003–1.030)
Squamous Epithelial: <1
WBC UR: 3 /HPF (ref 0–5)

## 2014-10-08 LAB — COMPREHENSIVE METABOLIC PANEL
ALK PHOS: 115 U/L (ref 46–116)
Albumin: 4 g/dL (ref 3.4–5.0)
Anion Gap: 12 (ref 7–16)
BILIRUBIN TOTAL: 1.9 mg/dL — AB (ref 0.2–1.0)
BUN: 45 mg/dL — ABNORMAL HIGH (ref 7–18)
CO2: 23 mmol/L (ref 21–32)
Calcium, Total: 9.7 mg/dL (ref 8.5–10.1)
Chloride: 104 mmol/L (ref 98–107)
Creatinine: 0.87 mg/dL (ref 0.60–1.30)
EGFR (African American): >60
Glucose: 367 mg/dL — ABNORMAL HIGH (ref 65–99)
OSMOLALITY: 304 (ref 275–301)
Potassium: 3.9 mmol/L (ref 3.5–5.1)
SGOT(AST): 47 U/L — ABNORMAL HIGH (ref 15–37)
SGPT (ALT): 48 U/L (ref 14–63)
Sodium: 139 mmol/L (ref 136–145)
Total Protein: 8.4 g/dL — ABNORMAL HIGH (ref 6.4–8.2)

## 2014-10-08 LAB — MAGNESIUM: Magnesium: 2.1 mg/dL

## 2014-10-08 LAB — CBC WITH DIFFERENTIAL/PLATELET
BASOS PCT: 0.1 %
Basophil #: 0 10*3/uL (ref 0.0–0.1)
Eosinophil #: 0 10*3/uL (ref 0.0–0.7)
Eosinophil %: 0 %
HCT: 51.4 % — AB (ref 35.0–47.0)
HGB: 17 g/dL — ABNORMAL HIGH (ref 12.0–16.0)
LYMPHS PCT: 8.8 %
Lymphocyte #: 1.4 10*3/uL (ref 1.0–3.6)
MCH: 31.5 pg (ref 26.0–34.0)
MCHC: 33 g/dL (ref 32.0–36.0)
MCV: 96 fL (ref 80–100)
MONO ABS: 0.8 x10 3/mm (ref 0.2–0.9)
Monocyte %: 4.8 %
NEUTROS ABS: 13.8 10*3/uL — AB (ref 1.4–6.5)
NEUTROS PCT: 86.3 %
Platelet: 144 10*3/uL — ABNORMAL LOW (ref 150–440)
RBC: 5.38 10*6/uL — AB (ref 3.80–5.20)
RDW: 14.1 % (ref 11.5–14.5)
WBC: 16 10*3/uL — ABNORMAL HIGH (ref 3.6–11.0)

## 2014-10-08 LAB — CK-MB
CK-MB: 3.4 ng/mL (ref 0.5–3.6)
CK-MB: 2.5 ng/mL (ref 0.5–3.6)

## 2014-10-08 LAB — PROTIME-INR
INR: 1.2
Prothrombin Time: 15.1 secs — ABNORMAL HIGH (ref 11.5–14.7)

## 2014-10-08 LAB — TROPONIN I
TROPONIN-I: 0.83 ng/mL — AB
TROPONIN-I: 0.53 ng/mL — AB
Troponin-I: 0.55 ng/mL — ABNORMAL HIGH

## 2014-10-08 LAB — AMMONIA: AMMONIA, PLASMA: 21 umol/L (ref 11–32)

## 2014-10-08 LAB — HEMOGLOBIN A1C: Hemoglobin A1C: 9 % — ABNORMAL HIGH (ref 4.2–6.3)

## 2014-10-09 DIAGNOSIS — I4891 Unspecified atrial fibrillation: Secondary | ICD-10-CM

## 2014-10-09 DIAGNOSIS — R7989 Other specified abnormal findings of blood chemistry: Secondary | ICD-10-CM

## 2014-10-09 DIAGNOSIS — I10 Essential (primary) hypertension: Secondary | ICD-10-CM

## 2014-10-09 LAB — CBC WITH DIFFERENTIAL/PLATELET
BASOS PCT: 0.4 %
Basophil #: 0.1 10*3/uL (ref 0.0–0.1)
EOS ABS: 0.1 10*3/uL (ref 0.0–0.7)
Eosinophil %: 0.6 %
HCT: 46.6 % (ref 35.0–47.0)
HGB: 15.3 g/dL (ref 12.0–16.0)
LYMPHS ABS: 2.9 10*3/uL (ref 1.0–3.6)
Lymphocyte %: 24.1 %
MCH: 31.8 pg (ref 26.0–34.0)
MCHC: 32.8 g/dL (ref 32.0–36.0)
MCV: 97 fL (ref 80–100)
MONO ABS: 1 x10 3/mm — AB (ref 0.2–0.9)
Monocyte %: 7.9 %
NEUTROS ABS: 8.1 10*3/uL — AB (ref 1.4–6.5)
Neutrophil %: 67 %
Platelet: 116 10*3/uL — ABNORMAL LOW (ref 150–440)
RBC: 4.81 10*6/uL (ref 3.80–5.20)
RDW: 14.1 % (ref 11.5–14.5)
WBC: 12.2 10*3/uL — ABNORMAL HIGH (ref 3.6–11.0)

## 2014-10-09 LAB — COMPREHENSIVE METABOLIC PANEL
AST: 141 U/L — AB (ref 15–37)
Albumin: 3.3 g/dL — ABNORMAL LOW (ref 3.4–5.0)
Alkaline Phosphatase: 98 U/L (ref 46–116)
Anion Gap: 6 — ABNORMAL LOW (ref 7–16)
BILIRUBIN TOTAL: 2 mg/dL — AB (ref 0.2–1.0)
BUN: 29 mg/dL — ABNORMAL HIGH (ref 7–18)
CALCIUM: 8.3 mg/dL — AB (ref 8.5–10.1)
CHLORIDE: 111 mmol/L — AB (ref 98–107)
Co2: 26 mmol/L (ref 21–32)
Creatinine: 0.74 mg/dL (ref 0.60–1.30)
EGFR (Non-African Amer.): >60
Glucose: 84 mg/dL (ref 65–99)
OSMOLALITY: 290 (ref 275–301)
Potassium: 3.2 mmol/L — ABNORMAL LOW (ref 3.5–5.1)
SGPT (ALT): 124 U/L — ABNORMAL HIGH (ref 14–63)
Sodium: 143 mmol/L (ref 136–145)
Total Protein: 6.8 g/dL (ref 6.4–8.2)

## 2014-10-10 LAB — COMPREHENSIVE METABOLIC PANEL
ALT: 142 U/L — AB (ref 14–63)
ANION GAP: 6 — AB (ref 7–16)
Albumin: 2.6 g/dL — ABNORMAL LOW (ref 3.4–5.0)
Alkaline Phosphatase: 85 U/L (ref 46–116)
BILIRUBIN TOTAL: 1.9 mg/dL — AB (ref 0.2–1.0)
BUN: 17 mg/dL (ref 7–18)
CHLORIDE: 111 mmol/L — AB (ref 98–107)
CO2: 23 mmol/L (ref 21–32)
Calcium, Total: 7.9 mg/dL — ABNORMAL LOW (ref 8.5–10.1)
Creatinine: 0.72 mg/dL (ref 0.60–1.30)
Glucose: 156 mg/dL — ABNORMAL HIGH (ref 65–99)
Osmolality: 284 (ref 275–301)
POTASSIUM: 3.7 mmol/L (ref 3.5–5.1)
SGOT(AST): 110 U/L — ABNORMAL HIGH (ref 15–37)
Sodium: 140 mmol/L (ref 136–145)
TOTAL PROTEIN: 5.4 g/dL — AB (ref 6.4–8.2)

## 2014-10-10 LAB — CBC WITH DIFFERENTIAL/PLATELET
Basophil #: 0.1 10*3/uL (ref 0.0–0.1)
Basophil %: 1 %
EOS ABS: 0.1 10*3/uL (ref 0.0–0.7)
Eosinophil %: 2 %
HCT: 42.6 % (ref 35.0–47.0)
HGB: 13.9 g/dL (ref 12.0–16.0)
LYMPHS PCT: 31.7 %
Lymphocyte #: 2.2 10*3/uL (ref 1.0–3.6)
MCH: 31.4 pg (ref 26.0–34.0)
MCHC: 32.5 g/dL (ref 32.0–36.0)
MCV: 97 fL (ref 80–100)
Monocyte #: 0.5 x10 3/mm (ref 0.2–0.9)
Monocyte %: 7.4 %
NEUTROS ABS: 4 10*3/uL (ref 1.4–6.5)
NEUTROS PCT: 57.9 %
Platelet: 90 10*3/uL — ABNORMAL LOW (ref 150–440)
RBC: 4.41 10*6/uL (ref 3.80–5.20)
RDW: 14 % (ref 11.5–14.5)
WBC: 6.8 10*3/uL (ref 3.6–11.0)

## 2014-10-11 DIAGNOSIS — R7989 Other specified abnormal findings of blood chemistry: Secondary | ICD-10-CM

## 2014-10-11 DIAGNOSIS — I517 Cardiomegaly: Secondary | ICD-10-CM

## 2014-10-11 DIAGNOSIS — I4891 Unspecified atrial fibrillation: Secondary | ICD-10-CM

## 2014-10-11 LAB — CBC WITH DIFFERENTIAL/PLATELET
Basophil #: 0 10*3/uL (ref 0.0–0.1)
Basophil %: 0.4 %
EOS PCT: 3.1 %
Eosinophil #: 0.1 10*3/uL (ref 0.0–0.7)
HCT: 39.2 % (ref 35.0–47.0)
HGB: 12.8 g/dL (ref 12.0–16.0)
Lymphocyte #: 1.9 10*3/uL (ref 1.0–3.6)
Lymphocyte %: 42.8 %
MCH: 31.5 pg (ref 26.0–34.0)
MCHC: 32.6 g/dL (ref 32.0–36.0)
MCV: 97 fL (ref 80–100)
MONO ABS: 0.3 x10 3/mm (ref 0.2–0.9)
Monocyte %: 6.5 %
NEUTROS PCT: 47.2 %
Neutrophil #: 2.1 10*3/uL (ref 1.4–6.5)
PLATELETS: 79 10*3/uL — AB (ref 150–440)
RBC: 4.05 10*6/uL (ref 3.80–5.20)
RDW: 13.8 % (ref 11.5–14.5)
WBC: 4.4 10*3/uL (ref 3.6–11.0)

## 2014-10-11 LAB — BASIC METABOLIC PANEL WITH GFR
Anion Gap: 7 (ref 7–16)
BUN: 9 mg/dL (ref 7–18)
Calcium, Total: 8.1 mg/dL — ABNORMAL LOW (ref 8.5–10.1)
Chloride: 109 mmol/L — ABNORMAL HIGH (ref 98–107)
Co2: 25 mmol/L (ref 21–32)
Creatinine: 0.59 mg/dL — ABNORMAL LOW (ref 0.60–1.30)
EGFR (African American): >60
EGFR (Non-African Amer.): >60
Glucose: 166 mg/dL — ABNORMAL HIGH (ref 65–99)
Osmolality: 284 (ref 275–301)
Potassium: 3.5 mmol/L (ref 3.5–5.1)
Sodium: 141 mmol/L (ref 136–145)

## 2014-10-12 LAB — COMPREHENSIVE METABOLIC PANEL
ALT: 95 U/L — AB (ref 14–63)
Albumin: 2.7 g/dL — ABNORMAL LOW (ref 3.4–5.0)
Alkaline Phosphatase: 78 U/L (ref 46–116)
Anion Gap: 8 (ref 7–16)
BUN: 6 mg/dL — AB (ref 7–18)
Bilirubin,Total: 0.8 mg/dL (ref 0.2–1.0)
Calcium, Total: 8.1 mg/dL — ABNORMAL LOW (ref 8.5–10.1)
Chloride: 107 mmol/L (ref 98–107)
Co2: 25 mmol/L (ref 21–32)
Creatinine: 0.66 mg/dL (ref 0.60–1.30)
GLUCOSE: 178 mg/dL — AB (ref 65–99)
Osmolality: 281 (ref 275–301)
POTASSIUM: 2.9 mmol/L — AB (ref 3.5–5.1)
SGOT(AST): 42 U/L — ABNORMAL HIGH (ref 15–37)
SODIUM: 140 mmol/L (ref 136–145)
Total Protein: 5.7 g/dL — ABNORMAL LOW (ref 6.4–8.2)

## 2014-10-13 LAB — BASIC METABOLIC PANEL
Anion Gap: 8 (ref 7–16)
BUN: 3 mg/dL — ABNORMAL LOW (ref 7–18)
CALCIUM: 8.5 mg/dL (ref 8.5–10.1)
CREATININE: 0.56 mg/dL — AB (ref 0.60–1.30)
Chloride: 112 mmol/L — ABNORMAL HIGH (ref 98–107)
Co2: 26 mmol/L (ref 21–32)
EGFR (African American): >60
EGFR (Non-African Amer.): >60
Glucose: 61 mg/dL — ABNORMAL LOW (ref 65–99)
Osmolality: 285 (ref 275–301)
Potassium: 3.1 mmol/L — ABNORMAL LOW (ref 3.5–5.1)
Sodium: 146 mmol/L — ABNORMAL HIGH (ref 136–145)

## 2014-10-13 LAB — CBC WITH DIFFERENTIAL/PLATELET
BASOS ABS: 0 10*3/uL (ref 0.0–0.1)
BASOS PCT: 0.4 %
Eosinophil #: 0.2 10*3/uL (ref 0.0–0.7)
Eosinophil %: 2 %
HCT: 38.4 % (ref 35.0–47.0)
HGB: 12.9 g/dL (ref 12.0–16.0)
Lymphocyte #: 2.1 10*3/uL (ref 1.0–3.6)
Lymphocyte %: 24.8 %
MCH: 32.1 pg (ref 26.0–34.0)
MCHC: 33.5 g/dL (ref 32.0–36.0)
MCV: 96 fL (ref 80–100)
MONOS PCT: 9.6 %
Monocyte #: 0.8 x10 3/mm (ref 0.2–0.9)
NEUTROS ABS: 5.3 10*3/uL (ref 1.4–6.5)
NEUTROS PCT: 63.2 %
Platelet: 113 10*3/uL — ABNORMAL LOW (ref 150–440)
RBC: 4.01 10*6/uL (ref 3.80–5.20)
RDW: 13.4 % (ref 11.5–14.5)
WBC: 8.4 10*3/uL (ref 3.6–11.0)

## 2014-10-13 LAB — CK TOTAL AND CKMB (NOT AT ARMC)
CK, Total: 29 U/L (ref 26–192)
CK-MB: 1.4 ng/mL (ref 0.5–3.6)

## 2014-10-13 LAB — LIPASE, BLOOD: LIPASE: 83 U/L (ref 73–393)

## 2014-10-13 LAB — TROPONIN I: Troponin-I: 0.03 ng/mL

## 2014-10-13 LAB — CULTURE, BLOOD (SINGLE)

## 2014-10-13 LAB — MAGNESIUM: Magnesium: 1.6 mg/dL — ABNORMAL LOW

## 2014-10-14 LAB — BASIC METABOLIC PANEL
ANION GAP: 5 — AB (ref 7–16)
Anion Gap: 8 (ref 7–16)
BUN: 3 mg/dL — ABNORMAL LOW (ref 7–18)
BUN: 3 mg/dL — ABNORMAL LOW (ref 7–18)
CALCIUM: 7.9 mg/dL — AB (ref 8.5–10.1)
CHLORIDE: 107 mmol/L (ref 98–107)
CO2: 27 mmol/L (ref 21–32)
CO2: 26 mmol/L (ref 21–32)
CREATININE: 0.67 mg/dL (ref 0.60–1.30)
Calcium, Total: 8.1 mg/dL — ABNORMAL LOW (ref 8.5–10.1)
Chloride: 107 mmol/L (ref 98–107)
Creatinine: 0.68 mg/dL (ref 0.60–1.30)
EGFR (African American): >60
EGFR (Non-African Amer.): >60
Glucose: 122 mg/dL — ABNORMAL HIGH (ref 65–99)
Glucose: 292 mg/dL — ABNORMAL HIGH (ref 65–99)
OSMOLALITY: 281 (ref 275–301)
OSMOLALITY: 283 (ref 275–301)
Potassium: 3.3 mmol/L — ABNORMAL LOW (ref 3.5–5.1)
Potassium: 4.1 mmol/L (ref 3.5–5.1)
Sodium: 142 mmol/L (ref 136–145)
Sodium: 138 mmol/L (ref 136–145)

## 2014-12-28 NOTE — H&P (Signed)
PATIENT NAME:  [name], [name] MR#:  [id number] DATE OF BIRTH:  [birth year]  DATE OF ADMISSION:  03/28/2012  REFERRING PHYSICIAN: Dr Corky Downs PRIMARY CARE PHYSICIAN: Reginia Forts, MD  CHIEF COMPLAINT: Abdominal pain, nausea, vomiting.   HISTORY OF PRESENT ILLNESS: This is a 59 year old female with history of diabetes with known history of gastroparesis with multiple admissions for similar complaints. The patient started to complain of abdominal pain, nausea, vomiting for the last few days where she could not tolerate any p.o. intake.  Upon presentation to the ED, the patient was in sinus tachycardia in the 140s which improved with IV fluid administration secondary to the patient's significant abdominal pain. She had CT abdomen and pelvis with IV contrast which only showed evidence of right nephrolithiasis without any other significant finding. The patient was found to have positive urinalysis. As well, patient reports she has been complaining of polyuria and dysuria for the last 24 hours. Hospitalist service was requested to admit the patient for further management of her abdominal pain as she may need IV pain medications for that and to continue with IV fluid resuscitation until she is able to tolerate p.o. intake. The patient denies any chest pain, shortness of breath, altered mental status, palpitation, coffee-grounds emesis, bright red blood per rectum, or hematochezia.   PAST MEDICAL HISTORY:  1. Diabetes mellitus, insulin-dependent.  2. Recurrent presentation of gastroparesis.  3. History of ileus.  4. Gastroesophageal reflux disease.  5. Last upper EGD March 2013 showing small erosions in the proximal esophagus with some esophagitis and gastric metaplasia.  6. Chronic back pain from bulging disk, maintained on narcotics on a chronic basis.  7. Irritable bowel syndrome.  8. Diverticulosis.  9. Last colonoscopy January 2012 showing colon polyps, diverticulosis, and 1 colonic angioplastic lesion.   10. Glaucoma by history.  11. Adrenal insufficiency, on p.o. hydrocortisone maintenance dose for the last year and a half.    PAST SURGICAL HISTORY:  1. Cholecystectomy.  2. Nissen fundoplication.  3. Hiatal hernia surgery.  4. Lysis of adhesions.  5. History of uterine cancer, status post hysterectomy.   SOCIAL HISTORY: Nonsmoker. No history of alcohol abuse. Retired, lives at home.   FAMILY HISTORY: Mother had diabetes and breast cancer. Father had hypertension.   HOME MEDICATIONS:  1. Benadryl 25 four times a day as needed. 2. Carafate 1 grams oral 3 times a day.  3. Cyclobenzaprine 10 mg 3 times a day as needed.  4. Humalog before meals, 10 units for small meals, 15 units for large meals.  5. Hydrocortisone 5 mg oral 2 times a day.  6. Levemir 70 units 2 times a day.   7. Metformin 500 mg 2 times a day.  8. Reglan 5 mg as needed 3 times daily.  9. Metoprolol tartrate 50 mg daily. 10. Omeprazole 20 mg 2 times a day. 11. Promethazine 25 mg every 6 hours.  12. Zolpidem 10 mg at night.  13. MS Contin 50 mg oral 2 times a day.   REVIEW OF SYSTEMS: Denies any fever, fatigue, weakness. EYES: Denies blurry vision, double vision, or pain. ENT: Denies tinnitus, ear pain, hearing loss. RESPIRATORY: Denies cough, wheezing, hemoptysis. CARDIOVASCULAR: Denies chest pain, orthopnea, palpitations. GI: Complains of nausea, vomiting, abdominal pain. GENITOURINARY: Complaints of dysuria, polyuria. Denies any renal colic. ENDOCRINE: Denies polyuria, polydipsia, heat or cold intolerance. HEMATOLOGY: Denies anemia, easy bruising, bleeding diathesis. INTEGUMENT: Denies any acne or rash. MUSCULOSKELETAL: Has chronic back pain. Denies any neck pain, swelling, or gout. NEUROLOGIC:  Denies numbness, weakness, dysarthria, tremors. PSYCHIATRIC: Denies anxiety, schizophrenia. Has insomnia.   PHYSICAL EXAMINATION:  VITAL SIGNS: Temperature 98.4, pulse 106, respiratory rate 16, blood pressure 120/74,  saturating 99% on room air.   GENERAL: Well-nourished female who looks comfortable in bed in no apparent distress.   HEENT: Head atraumatic, normocephalic. Pupils equal, reactive to light. Pink conjunctivae. Anicteric sclerae. Moist oral mucosa.   NECK: Supple. No thyromegaly. No JVD.   CHEST: Good air entry bilaterally. No wheezing, rales, rhonchi.   CARDIOVASCULAR: S1, S2 heard. No rubs, murmurs, or gallops. Tachycardic.   ABDOMEN: Diffuse mild tenderness to palpation. No rebound. No guarding. Bowel sounds present.   EXTREMITIES: No edema. No clubbing. No cyanosis.   PSYCHIATRIC: Appropriate affect. Awake, alert x3. Intact judgment and insight.   NEUROLOGIC: Cranial nerves grossly intact. Motor five out of five in all extremities.   SKIN: No rash, normal turgor, warm and dry.   PERTINENT LABORATORIES: Glucose 264, BUN 20, creatinine 0.76, sodium 137, potassium 3.8, chloride 101, CO2 of 24. White blood cells 11, hemoglobin 15.8, hematocrit 48.9, platelets 200,000. Urinalysis: White blood cells 12, trace for leukocyte esterase. CT abdomen and pelvis showing right nephrolithiasis.     ASSESSMENT AND PLAN: This is a 59 year old female with history of gastroparesis who presents with complaints of abdominal pain, nausea, and vomiting. 1. Abdominal pain. This is due to gastroparesis. We will start patient on erythromycin and p.r.n. Reglan and Zofran, and we will start her on IV hydration.  We will start with full liquid diet and will advance as tolerated. We will continue with p.r.n. pain medications.  2. Sinus tachycardia. This is most likely due to dehydration. We will continue with IV fluids. We will give 1 dose of IV Solu-Cortef as the patient is known to have history of  adrenal insufficiency and her p.o. intake has been unreliable for the last few days.  3. Urinary tract infection. We will continue with Rocephin.  4. Diabetes mellitus. We will continue patient on insulin and Humalog  before meals.  5. History of adrenal insufficiency. We will continue with hydrocortisone 5 mg oral b.i.d. We will give 1 dose of IV Solu-Medrol in ED.  6. Hypertension. Continue with metoprolol, especially with her sinus tachycardia.  7. Gastroesophageal reflux disease. Continue with Protonix.  8. Chronic pain syndrome. Continue with MS Contin and p.r.n. morphine.  9. On deep vein thrombosis, subcutaneous heparin and on Protonix for gastrointestinal prophylaxis.   CODE STATUS: FULL CODE.   TOTAL TIME SPENT ON PATIENT CARE: 55 minutes.    ___________________________ Albertine Patricia, MD dse:vtd D: 03/28/2012 22:51:40 ET T: 03/29/2012 08:17:07 ET JOB#: [id number]  cc: Albertine Patricia, MD, <Dictator> Renette Butters. Tamala Julian, MD Lynnita Somma Graciela Husbands MD ELECTRONICALLY SIGNED 03/29/2012 22:15

## 2014-12-28 NOTE — Discharge Summary (Signed)
PATIENT NAME:  [name], [name] MR#:  [id number] DATE OF BIRTH:  Sep 20, 1955  DATE OF ADMISSION:  03/28/2012 DATE OF DISCHARGE:  03/30/2012  PRIMARY CARE PHYSICIAN: Dr. Chapman Fitch GASTROENTEROLOGIST: Dr. Candace Cruise  DISCHARGE DIAGNOSES:  1. Gastroparesis. 2. Dehydration.  3. Hypoglycemia secondary to decreased oral intake.  4. Insulin-dependent diabetes mellitus.  5. Mild thrombocytopenia.  6. Adrenal insufficiency.   IMAGING STUDIES DONE: CT scan of the abdomen and pelvis with contrast. Right nephrolithiasis with no obstruction or hydronephrosis. No other acute findings.   CONSULTS: None.  ADMITTING HISTORY PHYSICAL: Please see detailed history and physical dictated on 03/28/2012. In brief 59 year old female patient with history of diabetes, gastroparesis, esophagitis and ileus presented to the Emergency Room complaining of nausea, vomiting, abdominal pain, severely dehydrated, unable to tolerate any oral liquids or solid food. Patient did not have any dysphagia. Was admitted to the hospitalist service for further management of her gastroparesis and severe dehydration.   HOSPITAL COURSE:  1. Gastroparesis. Patient had a CT of the abdomen and pelvis with IV contrast done which showed nonobstructing right-sided nephrolithiasis. No hydronephrosis. Patient had normal lipase, was started on symptomatic management of her pain and vomiting for gastroparesis with which she improved well. Patient was slowly started on liquid diet then on the day of discharge is tolerating solid foods well. She was on IV fluids for her dehydration. Patient follows up with Dr. Candace Cruise of GI as outpatient who has suggested patient be referred to [facility] for further management where patient had a gastric emptying study and has a follow-up appointment.  2. Hypoglycemia. Patient had low blood sugar of 47 with which she was asymptomatic which has resolved after she started taking solid food. This was likely due to continuing her insulin and  not taking enough orally.  3. Adrenal insufficiency. Patient has had adrenal insufficiency, is on oral steroids at home, was transitioned on to IV steroids during the hospital stay while she was n.p.o., later restarted on her p.o. steroids and will be discharged home on the same dose.  4. On the day of discharge patient is tolerating her food well. Last blood sugars have been 114 with vitals showing temperature 98, pulse 80, blood pressure 116/62. Abdominal examination being benign and is being discharged home in a stable condition.   DISCHARGE MEDICATIONS:  1. Metformin 500 mg oral 2 times a day.  2. Zolpidem 10 mg oral once a day.  3. Humalog insulin 15 units subcutaneous 3 times a day with meals.  4. Hydrocortisone 5 mg oral 2 times a day.  5. Levemir 20 units subcutaneous 3 times a day.  6. Morphine 60 mg extended release orally 2 times a day.  7. Norco 5/325 orally every six hours as needed for pain.  8. Promethazine 25 mg oral every six hours as needed for nausea, vomiting.  9. Metoclopramide 10 mg oral 3 times a day as needed.   DISCHARGE INSTRUCTIONS: Patient will follow up with her primary care physician, Dr. Chapman Fitch and Dr. Candace Cruise of GI in one week. She will be on a diabetic diet. Has been advised to eat small portions of food and check her blood sugars prior to meals and at bedtime. She has no restrictions on activity. Patient has been advised to return to Emergency Room if she has any worsening abdominal pain or vomiting.   TIME SPENT: Time spent today on this discharge dictation along with coordinating care, counseling of the patient was 35 minutes.  ____________________________ Rita Alf Burris Matherne, MD  srs:cms D: 03/30/2012 12:51:57 ET T: 03/31/2012 12:35:46 ET JOB#: [id number] cc: Alveta Heimlich R. Annarae Macnair, MD, <Dictator> Lupita Dawn. Candace Cruise, M.D. Dr. Raynelle Highland MD ELECTRONICALLY SIGNED 04/09/2012 13:51

## 2014-12-31 NOTE — Discharge Summary (Signed)
PATIENT NAME:  [name], [name] MR#:  [id number] DATE OF BIRTH:  [birth year]  DATE OF ADMISSION:  07/21/2013 DATE OF DISCHARGE:  07/24/2013  REASON FOR ADMISSION: Intractable nausea and vomiting and epigastric abdominal pain.   DISPOSITION: Home.   FINAL DIAGNOSES:  1.  Acute chest pain that developed during the hospitalization which is likely related to GI issues.  Normal enzymes. Normal EKG.  2.  Severe nausea and vomiting secondary to diabetes, gastroparesis with gastric emptying studywith significant delay.  3.  Urinary tract infection due to Escherichia coli.  4.  Atrial fibrillation, rate controlled.  5.  Esophagitis and gastritis, previously diagnosed on previous hospitalizations.  6.  Depression.  7.  Uncontrolled diabetes.  8.  >>  9.  Severe hypomagnesemia.  10.  Hypokalemia.  11.  Peripheral neuropathy.   MEDICATIONS AT DISCHARGE:  1.  Ambien 10 mg once a day.  2.  Metoprolol 50 mg once a day.  3.  Metoclopramide 5 mg four times daily.  4.  Lidoderm 5% as needed for pain, topical. 5.  NovoLog 20 units subcutaneously 2 times a day before meals.  6.  Protonix 40 mg once a day.  7.  Morphine 60 mg two times a day.  8.  Norco 5/325 mg four times daily as needed for pain.  9.  Erythromycin succinate 200 mg in 5 mL. Take 5 mL three times a day with meals.  10.  Ceftin 500 mg twice daily for 5 more days.  11.  Cyclobenzaprine 10 mg every 6 hours as needed for muscle pain.  12.  Levemir 80 units two times a day times subcutaneous.  13.  Zofran 4 mg every 6 hours as needed for nausea and vomiting.   FOLLOWUP: Antony Blackbird, MD, primary care physician, in 1 to 2 weeks, and with [facility] gastroenterology in 2 to 4 weeks for her gastroparesis.   HOSPITAL COURSE: A 59 year old female with history of uncontrolled diabetes, atrial fibrillation, chronic, not on anticoagulation, history of chronic pain on chronic narcotics use, who was recently discharged from the hospital within 3  to 4 days due to similar symptoms of significant nausea, vomiting, diarrhea. At that moment she had a CT scan that showed no acute findings. She had an EGD, and the EGD showed nonbleeding but significant erosion gastropathy. The patient had positive guaiac stools and she was discharged on PPI at the moment. She came back with intractable nausea and vomiting. Very likely the patient will have gastroparesis.   Gastroenterology evaluated the patient. Gastric emptying study was done and actually it proved that the patient had gastroparesis for which she was started on erythromycin. Since she was able to tolerate her diet, advanced slowly, having 3 meals without vomiting, for which the patient was discharged in good condition.   As far as her problems:  1.  Chest pain: The patient developed significant acute chest pain during this hospitalization. She was monitored closely. No changes on her rhythm strip. Cardiac enzymes were negative. No EKG changes from previous EKGs. Her T waves were inverted and increased on lower or inferior heart beats but they were chronically that way from previous.   2.  As far as her severe nausea and vomiting, it was due to gastroparesis. Erythromycin seemed to work pretty good for her.   3.  As far as her urinary tract infection, urine cultures were done showing Escherichia coli, after which she was started on cefuroxime.  4.  Atrial fibrillation was well controlled.  She has history of esophagitis and gastritis, for which she has been treated with a PPI.   5.  She also has history of cirrhosis which has been stable.   6.  For depression, the patient recommended to continue treatment.   7.  As far as her electrolyte disturbances, they were replaced with IV fluids and IV effusions and at discharge, these issues have resolved.   DISCHARGE CONDITION: Good condition with a lot of recommendations about changes in her diet.  1.  To control her diabetes better.  2.  To do  smaller meals more frequently. That way, the symptoms of reflux might improve.   TIME SPENT: I spent about 40 minutes with this discharge the day of discharge.    ____________________________ [place] Sink, MD rsg:np D: 07/28/2013 21:33:51 ET T: 07/28/2013 22:39:05 ET JOB#: [id number]  cc: [place] Sink, MD, <Dictator> Travin Marik America Brown MD ELECTRONICALLY SIGNED 07/29/2013 23:17

## 2014-12-31 NOTE — Consult Note (Signed)
Chief Complaint:  Subjective/Chief Complaint Pt continues to c/o nausea, no vomiting.  Denies diarrhea or abdominal pain.  "Ready to have something to drink".   VITAL SIGNS/ANCILLARY NOTES: **Vital Signs.:   11-Nov-14 15:19  Vital Signs Type Routine  Temperature Temperature (F) 98.6  Celsius 37  Temperature Source oral  Pulse Pulse 70  Respirations Respirations 18  Systolic BP Systolic BP 672  Diastolic BP (mmHg) Diastolic BP (mmHg) 75  Mean BP 86  Pulse Ox % Pulse Ox % 94  Pulse Ox Activity Level  At rest  Oxygen Delivery Room Air/ 21 %   Brief Assessment:  GEN well developed, well nourished, no acute distress, A/Ox3.   Cardiac Regular   Respiratory normal resp effort   Gastrointestinal Normal   Gastrointestinal details normal Soft  Nontender  Nondistended  Bowel sounds normal   EXTR negative cyanosis/clubbing, negative edema   Additional Physical Exam Skin: warm, dry, pink   Lab Results: Routine Chem:  11-Nov-14 05:04   Glucose, Serum  149  BUN 7  Creatinine (comp) 0.67  Sodium, Serum 142  Potassium, Serum 3.7  Chloride, Serum  110  CO2, Serum 28  Calcium (Total), Serum  7.6  Anion Gap  4  Osmolality (calc) 284  eGFR (African American) >60  eGFR (Non-African American) >60 (eGFR values <43m/min/1.73 m2 may be an indication of chronic kidney disease (CKD). Calculated eGFR is useful in patients with stable renal function. The eGFR calculation will not be reliable in acutely ill patients when serum creatinine is changing rapidly. It is not useful in  patients on dialysis. The eGFR calculation may not be applicable to patients at the low and high extremes of body sizes, pregnant women, and vegetarians.)  Hemoglobin A1c ([facility])  10.4 (The American Diabetes Association recommends that a primary goal of therapy should be <7% and that physicians should reevaluate the treatment regimen in patients with HbA1c values consistently >8%.)  Routine Hem:  11-Nov-14  05:04   WBC (CBC) 5.7  RBC (CBC)  3.63  Hemoglobin (CBC)  11.9  Hematocrit (CBC)  34.2  Platelet Count (CBC)  82  MCV 94  MCH 32.8  MCHC 34.8  RDW 14.0  Neutrophil % 59.3  Lymphocyte % 32.2  Monocyte % 6.3  Eosinophil % 1.9  Basophil % 0.3  Neutrophil # 3.4  Lymphocyte # 1.8  Monocyte # 0.4  Eosinophil # 0.1  Basophil # 0.0 (Result(s) reported on 21 Jul 2013 at 06:22AM.)   Assessment/Plan:  Assessment/Plan:  Assessment Intractable N/V: Possible underlying diabetic gastroparesis, GES pending.  UTI may be contributing as well.   Diarrhea:  No BM x 24 hrs.   Plan 1) FU GES 2) advance to clear liquids 3) UTI treatment per hosptialist 4) will follow with you Please call if you have any questions or concerns   Electronic Signatures: JAndria Meuse(NP)  (Signed 11-Nov-14 16:09)  Authored: Chief Complaint, VITAL SIGNS/ANCILLARY NOTES, Brief Assessment, Lab Results, Assessment/Plan   Last Updated: 11-Nov-14 16:09 by JAndria Meuse(NP)

## 2014-12-31 NOTE — Discharge Summary (Signed)
PATIENT NAME:  [name], [name] MR#:  [id number] DATE OF BIRTH:  1956/08/03  DATE OF ADMISSION:  09/29/2012 DATE OF DISCHARGE:  10/01/2012  PRIMARY CARE PHYSICIAN:  Cammie J. Fulp, MD   DISCHARGE DIAGNOSES:   1.  Gastroparesis.  2.  Sinus tachycardia. 3.  Diabetes.  4.  Chronic pain.  5.  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   CONDITION:  Stable.   CODE STATUS:  Full code.   HOME MEDICATIONS:  Zolpidem 10 mg p.o. 1 tablet at bedtime, morphine 60 mg per 12 hour tablets 1 tablet b.i.d., NovoLog sliding scale 20 units subcutaneous 3 times a day, promethazine 25 mg p.o. every 4 to 6 hours p.r.n., Lopressor 50 mg p.o. daily, Reglan 1 tablet 4 times a day, Lidoderm 5% topical film apply topically to affected area 2 to 3 times a day p.r.n., cyclobenzaprine 10 mg p.o. once a day, acetaminophen/hydrocodone 500/5 mg p.o. tablets 1 tab 4 times a day p.r.n. for 3 days, Levemir 40 units subcutaneous twice a day.   DIET:  Low sodium, low cholesterol, ADA diet.   ACTIVITY:  As tolerated.   FOLLOWUP CARE:  Follow with PCP within 1 to 2 weeks.   REASON FOR ADMISSION:  Nausea, vomiting and diarrhea.   HOSPITAL COURSE:  The patient is a 59 year old Caucasian female with a history of diabetes and gastroparesis who came to the ED for nausea, vomiting and diarrhea for 4 days with abdominal pain which was in the epigastric area, 8 out of 10. For a detailed history and physical examination, please refer to the admission note dictated by Dr. Levonne Hubert. On admission date, the patient's CAT scan of abdomen showed moderate gas distention of the stomach only. Lipase was 199. Troponin was less than 0.02. White count was 9.1, hemoglobin 16.7. Electrolytes were normal. Glucose was 370. The patient was admitted for gastroparesis. After admission, the patient was placed on clear liquids with Reglan and pain control with morphine. The patient's symptoms have much improved after the above-mentioned treatment.  Today, the patient has no nausea and vomiting. No diarrhea. No abdominal pain. The patient tolerated solid food.  1.  For her diabetes, the patient has been treated with sliding scale and Levemir. Initially, she was treated with Levemir 10 units b.i.d. Since blood pressure is elevated, Levemir was increased to 20 units subcutaneous b.i.d. The patient said she is taking Levemir 40 units b.i.d. and sliding scale about 20 units before meals at home.  2.  The patient has sinus tachycardia about 110 to 120. The patient has chronic tachycardia which has been treated with Lopressor.   The patient is clinically stable and will be discharged to home today. I discussed the patient's discharge plan with patient and case manager.   TIME SPENT: About 33 minutes.    ____________________________ Demetrios Loll, MD qc:si D: 10/01/2012 16:36:00 ET T: 10/01/2012 22:38:23 ET JOB#: [id number]  cc: Demetrios Loll, MD, <Dictator> Demetrios Loll MD ELECTRONICALLY SIGNED 10/02/2012 16:35

## 2014-12-31 NOTE — H&P (Signed)
PATIENT NAME:  [name], [name] MR#:  [id number] DATE OF BIRTH:  1955-09-17  DATE OF ADMISSION:  07/15/2013  PRIMARY CARE PHYSICIAN: Dr. Whitman Hero  CHIEF COMPLAINT: Nausea, vomiting, diarrhea and abdominal pain.  HISTORY OF PRESENT ILLNESS: This is a 59 year old female with a history of diabetes, A-fib and bulging disk on chronic narcotics who presents with the above complaint. The patient says over the past 3 days she has had nausea, vomiting, diarrhea and epigastric pain without any radiation. She also developed some chest pain today. While in the ER, it has been about 7 hours and it is constant in nature, 7 out of 10.  No radiation. No other symptoms associated with it. She also says that her son has been nauseous and was vomiting for the past 10 days. She has no history of travel and no fever or chills. She does not use NSAIDs. She denies any melena.   REVIEW OF SYSTEMS: CONSTITUTIONAL: No fever. Positive fatigue, weakness. EYES: Has no blurred or double vision, glaucoma or cataracts. ENT: No ear pain, hearing loss, seasonal allergies, snoring, postnasal drip.  RESPIRATORY: Denies any cough, wheezing, hemoptysis, dyspnea.  CARDIOVASCULAR: Positive chest pain. No palpitations, orthopnea, syncope, edema, arrhythmia, dyspnea on exertion. GASTROINTESTINAL: Positive nausea, vomiting and diarrhea. Positive midepigastric abdominal pain. No hematemesis, melena. No history of ulcer. She had an EGD in 2013 which showed esophagitis. GENITOURINARY: Denies any dysuria or hematuria.  ENDOCRINE: No polyuria or polydipsia. HEME AND LYMPH: No anemia or easy bruising.  SKIN: No rash or lesions.  MUSCULOSKELETAL: Positive limited activity due to her bulging disk and chronic back pain. NEUROLOGIC: No history of CVA, TIA or seizures. PSYCHIATRIC: No history of anxiety or depression.   PAST MEDICAL HISTORY: 1.  Atrial fibrillation. She does not take any Coumadin or anticoagulation.  2.  Esophagitis.  3.  Cirrhosis.   4.  Depression.  5.  Hypothyroidism.  6.  DJD.  7.  Diverticulitis.  8.  Diabetes.  9.  Pancreatitis.   PAST SURGICAL HISTORY:  1.  Cholecystectomy.  2.  Hysterectomy.  3.  Laser for her back surgery.   MEDICATIONS: 1.  Ambien 10 mg at bedtime.  2.  Morphine 60 mg b.i.d.  3.  Metoprolol 50 mg daily.  4.  Reglan 5 mg t.i.d.  5.  Lidoderm patch.   FAMILY HISTORY: Positive for hypertension and diabetes.   SOCIAL HISTORY: No tobacco, alcohol or drug use. She lives with her family and 2 sons and husband.   ALLERGIES: PREDNISONE, STEROID, DIPHTHERIA TOXOID, TETANUS, ADHESIVE TAPE.   PHYSICAL EXAMINATION: VITAL SIGNS: Temperature 97.9, pulse 115, respirations 20, blood pressure 171/83, 100% on room air.  GENERAL: The patient is alert and oriented, not in acute distress.  HEENT: Head is atraumatic. Pupils are round and reactive. Sclerae are anicteric. Mucous membranes are dry. Oropharynx is clear without any exudates.  NECK: Supple without JVD, carotid bruit, enlarged thyroid.  HEART: Tachycardia. No murmurs, gallops or rubs. PMI is not displaced. Regular rate.  LUNGS: Good respiratory effort. Clear to auscultation without crackles, rales, rhonchi or wheezing. Symmetrical rise and fall of the chest. Symmetrical expansion.  ABDOMEN: She has tenderness at the epigastric region without any radiation, rebound or guarding. No ecchymosis. Bowel sounds are positive in all quadrants.  EXTREMITIES: No clubbing, cyanosis or edema.  NEUROLOGIC: Cranial nerves II through XII are intact. There are no focal deficits. She is able to move all extremities.  SKIN: Without any kind of rashes or lesions.  BACK: There  is no CVA or vertebral tenderness.   LABORATORY AND DIAGNOSTICS: CT of the abdomen shows no evidence of acute abdominal processes. She has liver cirrhosis.   White blood cells 9.7, hemoglobin 15, hematocrit 45, platelets 133. Sodium 135, potassium 4.1, chloride 103, bicarb 25, BUN 12,  creatinine 0.70, glucose 396, calcium 9.1, bilirubin 1.7, alk phos 155, ALT 57, AST 58, total protein 7, albumin 3.6. Lipase 253. Urinalysis shows trace LCE with negative nitrites and 10 white blood cells. Troponin less than 0.02.   EKG shows some T wave inversions in the lateral leads, which are nonspecific.   ASSESSMENT AND PLAN: A 59 year old female with a history of diabetes and atrial fibrillation who presents with nausea, vomiting, diarrhea and epigastric abdominal pain.  1.  Mid epigastric abdominal pain which is tender to touch and associated with nausea, vomiting and diarrhea. She had an EGD in 2013 which showed esophagitis. She is pretty tender, therefore, I will go ahead and consult GI for re-evaluation, possible EGD. I have placed her on Protonix for nausea, vomiting and diarrhea. It is likely gastroenteritis. I will go head and continue IV fluids and supportive care. She had a CT of the abdomen which did not show any colitis or abdominal processes.  2.  Urinary tract infection, asymptomatic, but her leukocyte esterase is positive. We will go ahead and treat with ciprofloxacin, also check a urine culture.  3.  Mild hyponatremia secondary to nausea and vomiting. Will monitor and provide IV fluids.  4.  Diabetes. At this time, we will provide a full liquid diet so I will do sliding scale insulin and monitor her blood sugars.  5.  Chest pain. The patient was complaining of chest pain that started while she was here. She is currently not having any chest pain, however, I will go ahead and order some sets of troponins and also monitor her on telemetry. She has some nonspecific T changes on her EKG.  6.  History of degenerative joint disease. The patient is on chronic narcotics, which we will continue.  7.  Hypothyroidism. The patient says she has hypothyroidism, but does not appear from her med list that she is taking any medications. Will order a TSH.  8.  Accelerated hypertension, likely due to  pain. Will monitor, restart the metoprolol. 9.  History of atrial fibrillation. The patient is on anticoagulation. She is not taking any aspirin. I suspect this could be secondary to esophagitis. The patient does not know why she is not taking aspirin. She would meet at least criteria for aspirin given her CHADS score. After EGD may consider adding aspirin to her regimen. Her heart rate is elevated. Once we provide some fluids hopefully her heart rate will decrease. If not, I may need to increase her metoprolol.   CODE STATUS: FULL CODE.  TIME SPENT: 60 minutes.  ____________________________ Donell Beers. Benjie Karvonen, MD spm:sb D: 07/16/2013 08:21:11 ET T: 07/16/2013 09:13:31 ET JOB#: [id number]  cc: Averyanna Sax P. Benjie Karvonen, MD, <Dictator> Dr. Alonza Bogus P Freddy Spadafora MD ELECTRONICALLY SIGNED 07/16/2013 13:00

## 2014-12-31 NOTE — Consult Note (Signed)
PATIENT NAME:  Rita, [name] MR#:  [id number] DATE OF BIRTH:  16-Aug-1956  DATE OF CONSULTATION:  07/20/2013  REFERRING PHYSICIAN:  Dr. Bridgette Habermann  CONSULTING PHYSICIAN:  Dr. Lucilla Lame  PRIMARY CARE PHYSICIAN:  Dr. Sharin Mons   REASON FOR CONSULTATION: Intractable nausea and vomiting.   HISTORY OF PRESENT ILLNESS: The patient is a 59 year old Caucasian female who was actually admitted last week and seen in consultation with Dr. Lucilla Lame on 07/16/2013. She has a history of being seen by multiple gastroenterologists over the years, by Dr. Gustavo Lah, Dr. Candace Cruise, Dr. Dionne Milo, Dr. Vira Agar, and more recently Dr. Allen Norris. She had an EGD done during last admission by Dr. Allen Norris, on 07/16/2013, which showed nonbleeding erosive gastropathy. She had a CT scan of the abdomen and pelvis with IV contrast which showed cirrhosis, and a hiatal hernia and was otherwise normal status post cholecystectomy. He did see incidental, 8 mm and 3 mm bilateral renal lithiasis. She tells me she has been having nausea and vomiting about 8 times a day since discharge from the hospital. She complains of upper abdominal pain, which is 8 out of 10 on pain scale. She denies any heartburn or indigestion. She has had loose stools 3 or 4 per day. She denies any foreign travel, new medications other than what she was given during last hospitalization last week. Denies any ill contacts. She has poorly controlled diabetes mellitus. Her blood sugars have been in the 300 to 400 range, and she cannot seem to get them under control. Her last colonoscopy, she reports, was many years ago. She believes it was almost 10 years ago.   PAST MEDICAL AND SURGICAL HISTORY: Atrial fibrillation, diabetes mellitus, nonbleeding erosive gastropathy on EGD by Dr. Allen Norris 07/16/2013, cirrhosis, depression, hypothyroidism, degenerative joint disease, diverticulitis, diabetes and pancreatitis, status post cholecystectomy and hysterectomy.   MEDICATIONS PRIOR TO ADMISSION:  Cipro 500 mg q.12 hours for urinary tract infection, Lidoderm 5% topical 2 to 3 times daily p.r.n., Reglan 5 mg q.i.d., metoprolol succinate 50 mg extended release daily, morphine 60 mg b.i.d., NovoLog 20 units subcutaneously t.i.d., pantoprazole 40 mg daily, zolpidem 10 mg at bedtime.   ALLERGIES:  1.  PREDNISONE CAUSES UNKNOWN REACTION. 2.  STEROIDS CAUSE PATIENT'S HEART TO STOP. 3.  TETANUS, UNKNOWN REACTION. 4.  ADHESIVE AND TAPE CAUSE RASH.   FAMILY HISTORY: No family history for colorectal carcinoma, liver or chronic GI problems other than a brother with fatty liver.   SOCIAL HISTORY: She denies any tobacco, alcohol or illicit drug use.   REVIEW OF SYSTEMS: See history of present illness. She does have some chronic joint and low back pain. Otherwise, negative 12 point review of systems.   PHYSICAL EXAMINATION: VITAL SIGNS: Temperature 98.3, pulse 92, respirations 18, blood pressure 150/91, oxygen saturation 98% on room air.  GENERAL: She is a well-developed, well-nourished elderly Caucasian female in no acute distress.  HEENT: Sclerae clear, nonicteric, Conjunctivae are pink. Oropharynx is pink and moist, without any lesions.  NECK: Supple, without mass or thyromegaly.  CHEST: Heart rate is irregularly regular. No murmurs, clicks, rubs or gallops.  LUNGS: Clear to auscultation bilaterally.  ABDOMEN: Positive bowel sounds x4. No rebound tenderness or guarding. Abdomen is soft, nondistended. She has mild epigastric tenderness.  EXTREMITIES: Without clubbing or edema.  NEUROLOGIC:  Grossly intact.  SKIN: Warm and dry, without any rashes or jaundice.  PSYCHIATRIC: Alert, oriented, pleasant, cooperative, normal mood and affect.  MUSCULOSKELETAL: Good equal strength and movement bilaterally.   LABORATORY STUDIES: Glucose  291, sodium 135, potassium 3.4, calcium 8.4, otherwise normal met-7. Albumin 3, total bilirubin 1.6, AST 41, otherwise normal LFTs. Neutrophils 8.8, platelets 100,  otherwise normal CBC.   IMPRESSION: The patient is a pleasant, 59 year old Caucasian female with intractable nausea, vomiting and diarrhea. Her EGD last week showed erosive gastropathy, but that does not explain all of her symptoms. I suspect diabetic gastroparesis, small bowel bacterial overgrowth and diabetic autonomic enteropathy as the culprit of her symptoms. She also has nonalcoholic cirrhosis, likely due to fatty liver, which is compensated.   PLAN: 1.  Agree with PPI.  2.  Follow-up stool studies.  3.  Gastric emptying study tomorrow.  4. Consider colonoscopy if diarrhea persists.  We would like to thank you for allowing [name] to participate in the care of the patient.   ____________________________ Rita Meuse, [name] klj:cg D: 07/20/2013 23:43:56 ET T: 07/20/2013 23:56:11 ET JOB#: [id number]  cc: Rita Meuse, [name], <Dictator> Dr. Lorenza Cambridge FNP ELECTRONICALLY SIGNED 08/17/2013 15:28

## 2014-12-31 NOTE — Discharge Summary (Signed)
PATIENT NAME:  [name], [name] MR#:  [id number] DATE OF BIRTH:  09-Jan-1956  ADMISSION DIAGNOSES: Abdominal pain with nausea and vomiting.  DISCHARGE DIAGNOSES:  1.  Abdominal pain, likely musculoskeletal.  2.  Gastropathy.  3.  Nausea and vomiting as above. 4.  History of hypertension  5.  History of cirrhosis. 6.  Urinary tract infection.  CONSULTATIONS: Dr. Allen Norris.  PROCEDURE: The patient underwent EGD on 07/16/2013 which basically showed normal esophagus, nonbleeding erosive gastropathy and anti-reflux  surgical site was found, anastomosis characterized as visible suture.   HISTORY OF PRESENT ILLNESS: A 59 year old female who presented with nausea and vomiting, midepigastric pain. For further details, please refer to the H and P.   Mid-epigastric abdominal pain: The patient was seen by gastroenterology. Dr. Allen Norris felt that the pain was actually due to musculoskeletal pain and not a GI-related issue.   According to Dr. Allen Norris, the patient elicited a positive Carnett's sign, which is most significant with musculoskeletal pain, which was exacerbated by lifting the patient's legs above the bed by 6 inches and lightly palpating with 1 finger over abdominal wall. In any case, the patient did undergo an EGD with the results as stated above. It was not thought that her gastropathy was contributing to her abdominal pain. Once again, it was though that it was most likely musculoskeletal in nature.   Gastropathy: Dr. Allen Norris recommended PPI, which the patient will be discharged with.   Nausea and vomiting: This resolved, and she tolerated her diet.   Hypertension: The patient will continue on her outpatient medications.   Urinary tract infection: The patient was started on ciprofloxacin.   DISCHARGE MEDICATIONS:  1.  Ambien 10 mg nightly. 2.  Morphine 50 mg b.i.d. 3.  Metoprolol 50 mg daily. 4.  Reglan 5 mg 4 times daily. 5.  Lidoderm patch. 6.  NovoLog 20 units t.i.d.  7.  Pantoprazole 40 mg  daily.  8.  Ciprofloxacin 500 mg b.i.d. for 3 days.  DISCHARGE DIET: ADA diet.   DISCHARGE ACTIVITY: As tolerated.  FOLLOWUP: The patient will follow up in 3 months with Dr. Allen Norris and follow up in 2 weeks with Dr. Antony Blackbird.   TIME SPENT: 35 minutes.  The patient is medically stable for discharge.   ____________________________ Abbegail Matuska P. Benjie Karvonen, MD spm:np D: 07/17/2013 20:15:39 ET T: 07/17/2013 21:49:47 ET JOB#: [id number]  cc: Dafna Romo P. Benjie Karvonen, MD, <Dictator> Antony Blackbird, MD Lucilla Lame, MD  Donell Beers Abdulrahim Siddiqi MD ELECTRONICALLY SIGNED 07/20/2013 13:47

## 2014-12-31 NOTE — H&P (Signed)
PATIENT NAME:  [name], [name] MR#:  [id number] DATE OF BIRTH:  1956-03-25  DATE OF ADMISSION:  07/20/2013  PRIMARY CARE PHYSICIAN: Dr. Jayme Cloud  REFERRING PHYSICIAN: Dr. Darl Householder  CHIEF COMPLAINT: Nausea, vomiting, diarrhea.   HISTORY OF PRESENT ILLNESS: The patient is a 59 year old female with history of chronic A-fib, not on any anticoagulation, history of chronic pain, on chronic narcotics, who was just admitted and discharged on Friday for similar symptoms. At that time, she had a CT of the abdomen and pelvis as well as an EGD. The EGD showed non-bleeding erosive gastropathy and also the patient was found with antireflux at the surgical site and gastroesophageal junction. There are some visible sutures there. The duodenum was at that time normal. She was discharged with Cipro for suspected UTI. She presented back for similar symptoms. She stated that she has been having nausea and vomiting and it is difficult for her to keep stuff down. The patient also has the same pain, upper epigastric, and also has had diarrhea. The diarrhea is dark today, but before then she does not know. Here she was found with guaiac positive stools. Hospitalist services were contacted for further evaluation and management.   PAST MEDICAL HISTORY: Per chart:  1.  A-fib.  2.  Esophagitis as above. 3.  Cirrhosis. 4.  Depression. 5.  Hypothyroidism. 6.  Diabetes. 7.  Diverticulitis. 8.  Gastritis. 9.  Pancreatitis. 10.  DJD.   PAST SURGICAL HISTORY:  1.  Cholecystectomy. 2.  Hysterectomy. 3.  Laser surgery for her back.   ALLERGIES: PREDNISONE, STEROIDS, DIPHTHERIA TOXOID, TETANUS, ADHESIVE TAPE.   SOCIAL HISTORY: No tobacco, alcohol or drug use. Lives with her husband.  FAMILY HISTORY: Hypertension, and diabetes.   OUTPATIENT MEDICATIONS:  1.  Cipro 500 mg every 12 hours. 2.  Lidoderm patch p.r.n. 3.  Metoclopramide 5 mg 1 tab 4 times a day. 4.  Metoprolol succinate 50 mg extended-release once a day. 5.   Morphine 60 mg per 12 hour oral tablet 1 tablets 2 times a day. 6.  NovoLog 20 units 3 times a day. 7.  Pantoprazole 40 mg daily. 8.  Zolpidem 10 mg at bedtime.   REVIEW OF SYSTEMS: CONSTITUTIONAL: Positive for fatigue, weakness.  EYES: No blurry vision or double vision.  ENT: No tinnitus, hearing loss, allergies or snoring.  RESPIRATORY: Denies cough. Some dyspnea on exertion. No shortness of breath or wheezing.  CARDIOVASCULAR: No chest pains and stated that she did have syncope. Has a history of A-fib.  GASTROINTESTINAL: Positive for nausea, vomiting and epigastric pain. Dark stools today.  GENITOURINARY: Denies dysuria or blood in the urine. Denies polyuria or urgency.  ENDOCRINE: Denies polyuria or nocturia.  HEMATOLOGIC AND LYMPHATIC: Denies anemia or easy bruising.  SKIN: No rashes.  MUSCULOSKELETAL: Has chronic arthritis and back pain.  NEUROLOGIC: Denies, stroke or seizures.  PSYCHIATRIC: Denies anxiety or insomnia.   PHYSICAL EXAMINATION: VITAL SIGNS: Temperature on arrival was 98, pulse rate 103, respiratory rate 18, blood pressure 188/86, O2 sat 97% on room air.  GENERAL: The patient is an elderly appearing to female sitting on bed, in no obvious distress.  HEENT: Normocephalic, atraumatic. Pupils are equal and reactive. Anicteric sclerae. Somewhat dry mucous membranes.  NECK: Supple. No thyroid tenderness. No cervical lymphadenopathy.  CARDIOVASCULAR: S1 and S2, irregularly irregular. No significant murmurs, rubs or gallops.  LUNGS: Clear to auscultation.  ABDOMEN: Soft, nontender, nondistended. Hyperactive bowel sounds. However, no organomegaly appreciated.  EXTREMITIES: No significant lower extremity edema.  PSYCHIATRIC: Awake,  alert and oriented x 3. Pleasant, cooperative.  NEUROLOGIC: Cranial nerves II through XII grossly intact. Strength is 5/5 in all extremities.  SKIN: No obvious rashes.   LABORATORY AND DIAGNOSTICS: Glucose 291, BUN 10, creatinine 0.36, sodium  135, potassium 3.4. LFTs showed bilirubin of 1.6, AST 41 AND ALT 53. WBC 10.5, hemoglobin 15.1. Of note, it was 15.4 on November 6. Platelets are 100. The patient appears to have another low platelet of 133 on November 6th, but it was normal earlier this year. UA: 2+ leukocyte esterase, no nitrites, 8 WBC, 1+ bacteria.   EKG was read as accelerated junctional, nonspecific T wave abnormalities. No acute ST elevations or depressions.   ASSESSMENT AND PLAN: We have a 59 year old female with chronic pain, esophagitis, history of pancreatitis, diabetes and cirrhosis with recent hospitalizations for similar symptoms during which she had an EGD and CAT scan who presents with similar symptoms. Given ongoing symptoms we would start the patient on Zofran for her nausea and vomiting, start her on some fluids. The patient's did exhibit erosive gastropathy and also found was antireflux surgical site anastomosis. It is possible that she is having positive melena from that site, but I would go ahead and start the patient on PPI IV b.i.d., some fluids and continue her outpatient pain medications. I would also obtain a GI consult, but do not know if the patient is having a clear upper gastrointestinal bleed as her hemoglobin is actually stable and higher than last discharge hemoglobin. I would go ahead and obtain another hemoglobin tomorrow morning. The patient is not hypotensive. In regards to her p.o. intolerance, I would go ahead and do clears for now. Would continue her beta blocker for atrial fibrillation, which appears to be overall rate controlled. She is not on anticoagulation and given her symptoms I would not start her on an aspirin or such at this point. In regards to elevated blood pressure, I think there is some pain probably contributing to the elevated blood pressure, but I would also go ahead and add some p.r.n. IV labetalol in case blood pressures do not come down with conservative measures. I would start her on  SCDs and TEDS for deep vein thrombosis prophylaxis.   CODE STATUS: The patient is FULL CODE.   TOTAL TIME SPENT: 45 minutes.  ____________________________ Vivien Presto, MD sa:sb D: 07/20/2013 16:09:00 ET T: 07/20/2013 16:49:48 ET JOB#: [id number]  cc: Vivien Presto, MD, <Dictator> Vivien Presto MD ELECTRONICALLY SIGNED 08/04/2013 15:26

## 2014-12-31 NOTE — H&P (Signed)
PATIENT NAME:  [name], [name] MR#:  [id number] DATE OF BIRTH:  [birth year]  DATE OF ADMISSION:  09/28/2012  CODE STATUS: FULL CODE.  Surrogate decision maker is her husband.   CHIEF COMPLAINT: Nausea, vomiting and diarrhea.   HISTORY OF PRESENT ILLNESS: This is a 58 year old female with history of diabetes mellitus complicated by gastroparesis who presents with nausea, vomiting and diarrhea for four days. Abdominal pain is epigastric, stabbing and sharp in nature, moderate intensity and waxing and waning in nature. Currently it is 8 out of 10 pain. She has associated frequent emesis as well as diarrhea. These symptoms are similar to her prior bouts of gastroparesis. Upon evaluation in the ED, she is noted to be tachycardic, dehydrated, so we are called for admission and management of recurrent gastroparesis. The patient is unable to maintain any p.o. intake over the last 4 days due to ongoing nausea, emesis. In the Emergency Department, she received analgesics and antiemetics, however, she is still tachycardic.   PAST MEDICAL HISTORY: 1. Diabetes, insulin-dependent.  2. Pancreatitis.  3. Depression.  4. Recurrent episodes of gastroparesis requiring hospitalization.  5. Cirrhosis.  6. Gastroesophageal reflux disease. 7. Degenerative joint disease.  8. History of ileus. 9. Diverticulosis.  10. Adrenal insufficiency.  11. History of uterine cancer. 12. History of glaucoma.  PAST SURGICAL HISTORY: 1. Cholecystectomy. 2. Nissen fundoplication.  3. Hiatal hernia surgery. 4. Lysis of adhesions.  5. History of uterine cancer status post hysterectomy. 6. Back surgery, September 2013.  ALLERGIES:  1. PREDNISONE.  2. TETANUS/DIPHTHERIA TOXOID. 3. ADHESIVE TAPE.   MEDICATIONS: 1. Humalog 15 units subcutaneously 3 times a day.  2. Hydrocortisone 5 mg 1 tablet twice daily.  3. Levemir 20 units subcutaneously 3 times a day.  4. Metformin 500 mg twice daily.  5. Metoclopramide 10 mg 3 times a  day as needed.  6. Morphine 60 mg extended release tablet 1 tablet twice daily.  7. Norco 5/325 mg 1 tablet every 6 hours as needed for pain.  8. Promethazine 25 mg 1 tablet orally every 6 hours as needed for nausea and vomiting.  9. Zolpidem 10 mg 1 tablet orally daily at bedtime.  FAMILY HISTORY: Notable for diabetes, breast cancer and hypertension.   SOCIAL HISTORY: Denies tobacco, alcohol or illicit drug use.   REVIEW OF SYSTEMS:  CONSTITUTIONAL: Denies fevers. Admits to some mild fatigue.  EYES: No blurred vision. No double vision. No eye pain.   ENT: No tinnitus. No ear pain. No epistaxis.   RESPIRATORY: No cough, wheezing or shortness of breath.    CARDIOVASCULAR: Complains of intermittent chest pain. Denies orthopnea, edema or palpitations.   GASTROINTESTINAL: Admits to nausea, vomiting, diarrhea and abdominal pain. Denies hematemesis or melena.   GENITOURINARY: Denies frequency, urgency or dysuria.   ENDOCRINE: Has history of diabetes.  INTEGUMENT: Denies any new skin rashes.   MUSCULOSKELETAL: Has history of degenerative disk disease in the back status post back surgery. Denies any new joint pains, any muscle aches.   NEUROLOGICAL: Denies numbness, tingling, headaches or tremor.   PSYCHIATRIC: Has history of depression.   PHYSICAL EXAMINATION:  VITAL SIGNS: Temperature was 98.6 on presentation, heart rate 133 and currently in the  1-teens, respirations 18 and blood pressure on presentation 117/71 and currently 165/83.   GENERAL: Elderly Caucasian female in no apparent distress.  HEENT: Anicteric sclerae. Pupils equally round and reactive to light and accommodation. No conjunctivitis.   ENT: Normal external ears and nares. Oropharynx is clear without erythema or exudate.  There is dry mucosa.   CARDIOVASCULAR: S1 and S2 tachycardic. No pretibial edema.   RESPIRATORY: Clear to auscultation bilaterally. No wheeze, rales or rhonchi. Normal effort.   ABDOMEN:  Soft. There is tenderness in the epigastric as well as suprapubic region. No organomegaly. Normal bowel sounds. Abdomen is not distended.  SKIN: Warm and dry. No lesions appreciated.   MUSCULOSKELETAL: Full range of motion in bilateral upper and lower extremities without clubbing or cyanosis.   PSYCH: The patient is awake, alert and oriented x 3. Judgment and insight appear intact.   LABS/RADIOLOGIC STUDIES: White count 9.1, hemoglobin 16.7, hematocrit 50.5, platelets 227 and MCV 90. Glucose 370, BUN 18, creatinine 0.83, sodium 135, potassium 3.7, chloride 102, bicarbonate 22, calcium 9.4, anion gap 11 and osmolality 287.   Troponin less than 0.02.   CK 25.   CK-MB less than 0.5.   Total bilirubin 1.4, direct bilirubin 0.3, alkaline phosphatase 203, ALT 49, AST 43, total protein 7.4 and albumin 3.5.   Lipase 199.   Repeat troponin less than 0.02.   Urinalysis shows 2+ ketones, 1+ blood and negative nitrites and leukocyte esterase.   CT of the abdomen shows moderate gaseous distention of the stomach only.   EKG shows sinus tachycardia at 11 beats per minute.   ASSESSMENT AND PLAN: A 59 year old female presenting with nausea, vomiting and diarrhea likely due to recurring gastroparesis.  1. Gastroparesis. At this point, the patient will be placed on a clear liquid diet, continue Reglan, p.r.n. pain medications and antiemetics. We will advance diet as tolerated. There does not seem to be any evidence of infection. Please note that although the patient has ketonuria, she is not acidotic and hence this is not DKA. However, we will monitor her basic metabolic panel to ensure that she does not start to develop DKA.  2. Nausea, vomiting and diarrhea. Again most likely due to gastroparesis. This is improving. Management as detailed above.  3. Diabetes mellitus. Given the fact the patient will have decreased oral intake over the next 24 hours, Levemir has been decreased to 10 units 3 times daily  before meals with sliding scale insulin. This can then be titrated up once she starts to tolerate p.o.  4. Chronic pain. We will continue her oral medications and also add IV morphine as needed for severe pain, if she is unable to maintain oral intake.  5. Sinus tachycardia. Most likely due to dehydration. Continue IV fluids. She has received one dose of IV hydrocortisone for stress dose, given her history of adrenal insufficiency. We will continue her oral regimen. If she is unable to tolerate oral hydrocortisone, we will continue hydrocortisone IV.  6. For all other chronic medical conditions, we will continue her medications.   CODE STATUS: FULL CODE.   DISPOSITION: The patient is being admitted inpatient for management of gastroparesis. I anticipate she will require two hospital midnight stays for management.   TIME SPENT ON ADMISSION: 50 minutes.  ____________________________ Samson Frederic, DO aeo:sb D: 09/29/2012 07:58:00 ET T: 09/29/2012 08:20:03 ET JOB#: [id number]  cc: Samson Frederic, DO, <Dictator> Miki Kins, MD Sakiya Stepka E Keiji Melland DO ELECTRONICALLY SIGNED 11/04/2012 3:38

## 2014-12-31 NOTE — Consult Note (Signed)
Chief Complaint:  Subjective/Chief Complaint Pt notes 4/10 epigastric pain.  Denies nausea, no vomiting.  Denies diarrhea.  "Ready to have something to eat".   VITAL SIGNS/ANCILLARY NOTES: **Vital Signs.:   12-Nov-14 05:21  Vital Signs Type Routine  Temperature Temperature (F) 97.7  Celsius 36.5  Temperature Source oral  Pulse Pulse 77  Respirations Respirations 18  Systolic BP Systolic BP 409  Diastolic BP (mmHg) Diastolic BP (mmHg) 73  Mean BP 86  Pulse Ox % Pulse Ox % 97  Pulse Ox Activity Level  At rest  Oxygen Delivery Room Air/ 21 %   Brief Assessment:  GEN well developed, well nourished, no acute distress, A/Ox3.   Cardiac Regular   Respiratory normal resp effort   Gastrointestinal Normal   Gastrointestinal details normal Soft  Nontender  Nondistended  Bowel sounds normal   EXTR negative cyanosis/clubbing, negative edema   Additional Physical Exam Skin: warm, dry, pink   Lab Results: Routine Chem:  12-Nov-14 05:26   Glucose, Serum  191  BUN 7  Creatinine (comp)  0.52  Sodium, Serum 140  Potassium, Serum  3.3  Chloride, Serum  108  CO2, Serum 28  Calcium (Total), Serum  7.9  Anion Gap  4  Osmolality (calc) 283  eGFR (African American) >60  eGFR (Non-African American) >60 (eGFR values <36m/min/1.73 m2 may be an indication of chronic kidney disease (CKD). Calculated eGFR is useful in patients with stable renal function. The eGFR calculation will not be reliable in acutely ill patients when serum creatinine is changing rapidly. It is not useful in  patients on dialysis. The eGFR calculation may not be applicable to patients at the low and high extremes of body sizes, pregnant women, and vegetarians.)  Result Comment potassium/mg - Slight hemolysis, interpret results with  - caution.  Result(s) reported on 22 Jul 2013 at 06:28AM.  Magnesium, Serum  1.3 (1.8-2.4 THERAPEUTIC RANGE: 4-7 mg/dL TOXIC: > 10 mg/dL  -----------------------)  Routine Hem:   12-Nov-14 05:26   WBC (CBC) 5.0  RBC (CBC)  3.77  Hemoglobin (CBC) 12.3  Hematocrit (CBC) 36.0  Platelet Count (CBC)  82  MCV 95  MCH 32.5  MCHC 34.1  RDW 14.2  Neutrophil % 57.9  Lymphocyte % 32.4  Monocyte % 7.1  Eosinophil % 2.2  Basophil % 0.4  Neutrophil # 2.9  Lymphocyte # 1.6  Monocyte # 0.4  Eosinophil # 0.1  Basophil # 0.0 (Result(s) reported on 22 Jul 2013 at 06:28AM.)   Assessment/Plan:  Assessment/Plan:  Assessment Diabetic gastroparesis:  Will start EES before meals.  UTI may be contributing as well.   Diarrhea:  Resolved   Plan 1) EES 2088mTID AC 3047mbefore meals 2) 6 small meals daily 3) Advance to gastroparesis diet 4) Dietician consult for gastroparesis/diabetic diet teaching 5)continue PPI 6) urge tight glycemic control 7) K/Mag repletion per attending Please call if you have any questions or concerns   Electronic Signatures: JonAndria MeuseP)  (Signed 12-Nov-14 10:00)  Authored: Chief Complaint, VITAL SIGNS/ANCILLARY NOTES, Brief Assessment, Lab Results, Assessment/Plan   Last Updated: 12-Nov-14 10:00 by JonAndria MeuseP)

## 2014-12-31 NOTE — Consult Note (Signed)
Brief Consult Note: Diagnosis: intractable N/V.   Patient was seen by consultant.   Consult note dictated.   Comments: Ms. Kempe is a pleasant 59 y/o female with intractable N/V & diarrhea.  EGD last week showed erosive gastropathy but that does not explain all of her symptoms.  I suspect diabetic gastroparesis, small bowel bacterial overgrowth & diabetic autonomic enteropathy.    Plan: 1) Agree with PPI 2) FU stool studies 3) GES tomorrow 4) Consider colonoscopy if diarrhea persists  Thanks for consult.  Please see full dictated note.  #[id number].  Electronic Signatures: Andria Meuse (NP)  (Signed [phone] 23:35)  Authored: Brief Consult Note   Last Updated: 10-Nov-14 23:35 by Andria Meuse (NP)

## 2014-12-31 NOTE — Consult Note (Signed)
Assessment/Plan:  Assessment/Plan:  Assessment Attempted GI FU visit but pt not in room. I did speak with Dr Laurin Coder as pt's urine cx grew gram neg rods, sensitivity pending He will start Rocephin. Await GES.   Electronic Signatures: Andria Meuse (NP)  (Signed 11-Nov-14 11:57)  Authored: Assessment/Plan   Last Updated: 11-Nov-14 11:57 by Andria Meuse (NP)

## 2014-12-31 NOTE — Consult Note (Signed)
Chief Complaint:  Subjective/Chief Complaint Pt notes 2/10 epigastric pain.  Denies nausea, no vomiting.  Denies diarrhea. Tolerating gastroparesis diet well.   VITAL SIGNS/ANCILLARY NOTES: **Vital Signs.:   13-Nov-14 04:58  Vital Signs Type Routine  Temperature Temperature (F) 98.4  Celsius 36.8  Temperature Source oral  Pulse Pulse 89  Respirations Respirations 18  Systolic BP Systolic BP 500  Diastolic BP (mmHg) Diastolic BP (mmHg) 76  Mean BP 99  Pulse Ox % Pulse Ox % 95  Pulse Ox Activity Level  At rest  Oxygen Delivery Room Air/ 21 %   Brief Assessment:  GEN well developed, well nourished, no acute distress, A/Ox3.   Cardiac Regular   Respiratory normal resp effort   Gastrointestinal Normal   Gastrointestinal details normal Soft  Nontender  Nondistended  Bowel sounds normal   EXTR negative cyanosis/clubbing, negative edema   Additional Physical Exam Skin: warm, dry, pink   Lab Results:  Routine Chem:  13-Nov-14 07:53   Glucose, Serum  204  BUN 7  Creatinine (comp) 0.65  Sodium, Serum 139  Potassium, Serum 3.7  Chloride, Serum 107  CO2, Serum 29  Calcium (Total), Serum  7.9  Anion Gap  3  Osmolality (calc) 281  eGFR (African American) >60  eGFR (Non-African American) >60 (eGFR values <68m/min/1.73 m2 may be an indication of chronic kidney disease (CKD). Calculated eGFR is useful in patients with stable renal function. The eGFR calculation will not be reliable in acutely ill patients when serum creatinine is changing rapidly. It is not useful in  patients on dialysis. The eGFR calculation may not be applicable to patients at the low and high extremes of body sizes, pregnant women, and vegetarians.)   Assessment/Plan:  Assessment/Plan:  Assessment Diabetic gastroparesis:  Tolerating EES before meals.  Ecoli UTI likely contributing as well.  H pylori serology negative.  Pt pleased with gastroparesis dietary teaching. Diarrhea:  Resolved.   Plan 1)  Continue EES 2044mTID AC 3032mbefore meals 2) 6 small meals daily/gastroparesis diet 3) continue PPI 4) urge tight glycemic control & FU with PCP 5) limit narcotics as able Please call if you have any questions or concerns   Electronic Signatures: JonAndria MeuseP)  (Signed [phone])  Authored: Chief Complaint, VITAL SIGNS/ANCILLARY NOTES, Brief Assessment, Lab Results, Assessment/Plan   Last Updated: 13-Nov-14 11:38 by JonAndria MeuseP)

## 2014-12-31 NOTE — Consult Note (Signed)
Chief Complaint:  Subjective/Chief Complaint Pt notes 2/10 epigastric pain.  c/o nausea, reports 2 episodes of scant emesis.  Denies diarrhea. RN notes pt has been "eating non-stop day & night".   VITAL SIGNS/ANCILLARY NOTES: **Vital Signs.:   14-Nov-14 06:38  Vital Signs Type Routine  Temperature Temperature (F) 98.6  Celsius 37  Temperature Source oral  Pulse Pulse 89  Respirations Respirations 19  Systolic BP Systolic BP 958  Diastolic BP (mmHg) Diastolic BP (mmHg) 80  Mean BP 92  Pulse Ox % Pulse Ox % 98  Pulse Ox Activity Level  At rest  Oxygen Delivery Room Air/ 21 %   Brief Assessment:  GEN well developed, well nourished, no acute distress, A/Ox3.   Cardiac Regular   Respiratory normal resp effort   Gastrointestinal Normal   Gastrointestinal details normal Soft  Nontender  Nondistended  Bowel sounds normal   EXTR negative cyanosis/clubbing, negative edema   Additional Physical Exam Skin: warm, dry, pink   Lab Results: Routine Chem:  14-Nov-14 08:25   Glucose, Serum  359  BUN 10  Creatinine (comp) 0.71  Sodium, Serum 137  Potassium, Serum 3.8  Chloride, Serum 105  CO2, Serum 24  Calcium (Total), Serum  7.8  Anion Gap 8  Osmolality (calc) 287  eGFR (African American) >60  eGFR (Non-African American) >60 (eGFR values <51m/min/1.73 m2 may be an indication of chronic kidney disease (CKD). Calculated eGFR is useful in patients with stable renal function. The eGFR calculation will not be reliable in acutely ill patients when serum creatinine is changing rapidly. It is not useful in  patients on dialysis. The eGFR calculation may not be applicable to patients at the low and high extremes of body sizes, pregnant women, and vegetarians.)   Assessment/Plan:  Assessment/Plan:  Assessment Diabetic gastroparesis:  Tolerating EES.  Overall doing much better.  Working on tighter glycemic control which will make a significant difference in this patient. Diarrhea:   Resolved.   Plan 1) Continue plan of care 2) Office visit with [facility] 4 weeks Will sign off, Please call if you have any questions or concerns   Electronic Signatures: JAndria Meuse(NP)  (Signed [phone])  Authored: Chief Complaint, VITAL SIGNS/ANCILLARY NOTES, Brief Assessment, Lab Results, Assessment/Plan   Last Updated: 14-Nov-14 09:04 by JAndria Meuse(NP)

## 2014-12-31 NOTE — Consult Note (Signed)
PATIENT NAME:  [name], [name] MR#:  [id number] DATE OF BIRTH:  [birth year]  DATE OF CONSULTATION:  07/16/2013  REFERRING PHYSICIAN:   CONSULTING PHYSICIAN:  Lucilla Lame, MD  CONSULTING SERVICE:  Gastroenterology.   REASON FOR CONSULTATION:  Abdominal pain.   HISTORY OF PRESENT ILLNESS:  This patient is a 59 year old woman who has been seen by multiple gastroenterologists over the past few years. She had consults by Dr. Gustavo Lah, Dr. Candace Cruise, Dr. Dionne Milo and Dr. Vira Agar. The patient's last EGD was done by Dr. Vira Agar and showed some esophagitis. The patient states that she had these episodes of severe epigastric pain. They usually lasts approximately 10 days. She has been in the hospital multiple times for this. She also has a history of sever cirrhosis, but does not know what has caused her to have the cirrhosis. She states that her brother had nonalcoholic fatty liver disease. She now reports that the abdominal pain was related to nausea and vomiting. The last time the patient vomited was in the Emergency Room. She was nauseated today. She also reports that the present symptoms have been going on for approximately three days. The patient had a previous EGD that showed some gastritis. I am now being asked to see the patient for her epigastric pain.   REVIEW OF SYSTEMS:  A 10-point review of systems was reviewed and was negative except what was stated above.   PAST MEDICAL HISTORY:  Atrial fibrillation, esophagitis, cirrhosis, depression, hypothyroidism, DJD, diverticulitis, diabetes, pancreatitis.   PAST SURGICAL HISTORY: Cholecystectomy, hysterectomy.  MEDICATIONS:  Ambien, morphine, metoprolol, Reglan.   FAMILY HISTORY:  Noncontributory.   SOCIAL HISTORY:  Denies alcohol, tobacco or drugs.   ALLERGIES:  PREDNISONE, STEROIDS, DIPHTHERIA TOXIN, TETANUS, ADHESIVE TAPE.   PHYSICAL EXAM:  VITAL SIGNS:  Temperature 97.9, pulse 115, respirations 20, blood pressure 171/83, pulse oximetry 100%.   HEENT:  Normocephalic, atraumatic. Extraocular motor intact. Pupils equal, round, and reactive to light and accommodation, without JVD, without lymphadenopathy.  LUNGS:  Clear to auscultation bilaterally.  HEART:  Regular rate and rhythm without murmurs, rubs or gallops.  ABDOMEN:  Soft with a pinpoint epigastric tenderness that was made worse with lifting her leg 6 inches above the exam table and palpating lightly with one finger, thereby eliciting the Carnett sign.  EXTREMITIES:  Without cyanosis, clubbing or edema.  NEUROLOGICAL:  Grossly intact.  SKIN:  Without any rashes or lesions.  PSYCHIATRIC:  Alert and oriented x 3.   ANCILLARY SERVICES:  CT scan without any cause for her abdominal pain seen. Cardiac enzymes were negative. Bilirubin is slightly elevated at 1.7 with alkaline phosphatase of 155, hemoglobin 15.4.   ASSESSMENT AND PLAN:  This patient is a 59 year old woman with a history of cirrhosis who states that she has not follow up for her cirrhosis in the past. She has been seen by multiple gastroenterologists, including Dr. Gustavo Lah, Dr. Candace Cruise, Dr. Dionne Milo and Dr. Vira Agar for similar symptoms of nausea, vomiting with abdominal pain. The patient was brought down immediately after being seen for an esophagogastroduodenoscopy that showed some mild gastritis with erosions. It also showed her to have a previous Nissen fundoplication with some sutures seen in the stomach. Despite the gastritis, her physical exam is most consistent with musculoskeletal pain, which was exacerbated by lifting the patient's legs above the bed x 6 inches and lightly palpating with one finger over her abdominal wall muscles. This elicited a positive Carnett sign. The patient has been told my findings and should be treated with  a proton pump inhibitor because of gastritis seen on the esophagogastroduodenoscopy. Again the gastritis was not significant enough to cause this degree of the patient's abdominal pain.   Thank  you very much for allowing me to see this patient. If you have any questions, please do not hesitate to call.  ____________________________ Lucilla Lame, MD dw:jm D: 07/16/2013 18:11:41 ET T: 07/16/2013 18:20:28 ET JOB#: [id number]  cc: Lucilla Lame, MD, <Dictator> Lucilla Lame MD ELECTRONICALLY SIGNED 07/20/2013 9:42

## 2015-01-02 NOTE — H&P (Signed)
PATIENT NAME:  [name], [name] MR#:  [id number] DATE OF BIRTH:  Dec 12, 1955  DATE OF ADMISSION:  01/28/2012  PRIMARY CARE PHYSICIAN: Antony Blackbird, MD  CHIEF COMPLAINT: Intractable vomiting x2 days.   HISTORY OF PRESENT ILLNESS: [name] is a 59 year old pleasant Caucasian female with history of gastroparesis, history of diabetes mellitus type I-uncontrolled, and autonomic peripheral neuropathy. The patient presented to the emergency department with two day history of intractable vomiting associated with some diarrhea as well. She also describes some pain at the epigastric area radiating to the right side of the abdomen. The patient got dehydrated. She felt dizzy and had near fainting episodes. She states that her blood sugar is elevated above the usual and there is a feeling of tachycardia or racing heart rate. A week ago she also reports some cough with sputum production treated with Zithromax. There is residual chest pain on the left side of the chest and left shoulder. She describes some mild shortness of breath. The patient had similar presentation when she was admitted in March of this year.  REVIEW OF SYSTEMS: CONSTITUTIONAL: Denies any fever. No chills. No night sweats. Reports mild fatigue. EYES: No blurring of vision. No double vision. ENT: No hearing impairment. No sore throat. No dysphagia. CARDIOVASCULAR: Reports mild left-sided chest pain and mild shortness of breath. No syncope but she had near syncope feeling. RESPIRATORY: Recent cough and sputum production; this is improving. No hemoptysis. Mild chest pain and little shortness of breath. GASTROINTESTINAL: Repetitive vomiting and mild diarrhea. Abdominal pain as above. GENITOURINARY: No dysuria. No frequency of urination. MUSCULOSKELETAL: No joint pain or swelling other than her chronic back pain from herniated disk disease. No muscular pain or swelling. INTEGUMENTARY: No skin rash. No ulcers. NEUROLOGY: No focal weakness. No seizure activity.  No headache. PSYCHIATRY: No anxiety. No depression. ENDOCRINE: Reports polyuria and polydipsia. No heat or cold intolerance.   PAST MEDICAL HISTORY:  1. Type 1 diabetes mellitus, uncontrolled, with multiple admissions due to diabetic complications.  2. Recurrent presentations of gastroparesis.  3. History of ileus.  4. Gastroesophageal reflux disease.  5. Last upper endoscopy or EGD was in March 2013 showing small erosions in the proximal esophagus with some esophagitis and some gastric metaplasia.  6. Chronic back pain from bulging disk maintained on narcotic on a chronic basis.  7. Irritable bowel syndrome.  8. Diverticulosis.  9. Last colonoscopy was in January 2012 showing diverticulosis and colon polyps. There was also one colonic angiodysplastic lesion.  10. Glaucoma by history.   PAST SURGICAL HISTORY:  1. Cholecystectomy. 2. Nissen fundoplication. 3. Hiatal hernia surgery. 4. Lysis of adhesions.  5. History of uterine cancer status post hysterectomy.   SOCIAL HABITS: Nonsmoker. No history of alcohol abuse.   SOCIAL HISTORY: She is retired and lives at home with her husband.   FAMILY HISTORY: Her mother suffered from diabetes, and had breast cancer. Her father had hypertension.   ADMISSION MEDICATIONS:  1. Metoprolol succinate 50 mg once a day. 2. Metoclopramide 5 mg four times daily. 3. Cyclobenzaprine (Flexeril) 10 mg three times daily. 4. Ambien 10 mg at night, 5. Promethazine 25 mg every six hours p.r.n.  6. MS Contin 60 mg twice a day. 7. Acetaminophen/hydrocodone 5/500 mg every six hours; this is used only p.r.n.  8. Lidoderm patch once a day.  9. Levemir 70 units twice a day.  10. Humalog insulin 15 units three times daily before meals.  11. Hydrocortisone 5 mg twice a day for suspected adrenal insufficiency.  12. Metformin XR 1000 mg once a day. 13. Omeprazole 20 mg twice a day.  14. Carafate 1 gram three times daily with meals.   ALLERGIES: Prednisone  stating causing redness of skin. Tetanus with diphtheria toxoid causing fever.   PHYSICAL EXAMINATION:   VITAL SIGNS: Blood pressure 165/82, respiratory rate 20, pulse 113, temperature 96.6, and oxygen saturation 96%.   GENERAL APPEARANCE: Middle-aged female lying in bed in no acute distress.   HEAD AND NECK: No pallor. No icterus. No cyanosis.   ENT: Hearing was normal. Nasal mucosa, lips, and tongue were normal   EYES: Normal eyelids and conjunctiva. Pupils are about 5 mm, equal and reactive to light.   NECK: Supple. Trachea at midline. No thyromegaly. No cervical lymphadenopathy. No masses.   HEART: Normal S1 and S2. No S3 or S4. No murmur. No gallop. No carotid bruits.   LUNGS: Normal breathing pattern without use of accessory muscles. No rales. No wheezing.   ABDOMEN: Soft without tenderness. No hepatosplenomegaly. No masses. No hernias.   SKIN: No ulcers. No subcutaneous nodules.   MUSCULOSKELETAL: No joint swelling. No clubbing.   NEUROLOGIC: Cranial nerves II through XII grossly intact. No focal motor deficits.   PSYCHIATRY: The patient is alert and oriented x3. Mood and affect were normal.  LABS/STUDIES: EKG showed sinus tachycardia at rate of 119 per minute. Q waves in leads III and aVF. Poor progression of R waves in the anterior chest leads. Nonspecific T wave abnormalities. EKG is unchanged compared to her previous EKG on 11/10/2011.   Blood sugar is 406, BUN 11, creatinine 0.6, sodium 139, and potassium 3.9. Lipase was 251. Albumin 3.3, bilirubin 0.9, AST slightly elevated at 51, ALT 71, and alkaline phosphatase 124. CBC showed white count of 6000, hemoglobin 15, hematocrit 45, and platelet count 144.   IMPRESSION:  1. Intractable vomiting secondary to gastroparesis.  2. Near syncope secondary to orthostasis symptoms.  3. Dehydration. 4. Uncontrolled diabetes type 1. 5. Atypical chest pain likely secondary to her recent bronchitis and she has recovered from that.    ADDITIONAL MEDICAL PROBLEMS:  1. Autonomic neuropathy secondary to her diabetes. 2. Suspected adrenal insufficiency. 3. Chronic back pain secondary to herniated disk disease. 4. Gastroesophageal reflux disease and mild esophagitis. 5. Irritable bowel syndrome. 6. History of hysterectomy, cholecystectomy, and Nissen fundoplication.  PLAN: Admit to the medical floor. IV hydration with normal saline to correct the hypovolemia and hopefully will help with her symptoms of near syncope upon standing. Accu-Cheks and sliding scale. I will continue the Humalog insulin, but hold the Levemir until she is able to eat or tolerate her diet. Zofran p.r.n. Continue metoclopramide three times a day. I will check her troponin given her complaint of the chest pain, although this is atypical and appears to be related to her resolving bronchitis. I would like to mention that her chest x-ray is clear without any infiltrates or consolidation or effusion. Ultrasound of the abdomen also was unremarkable. There is a question about the heterogenicity of the liver appearance by ultrasound from fatty liver versus cannot rule out malignancy. Continue pain control with MS Contin, at the same dose. Continue the rest of medication list as listed above.   TIME SPENT: More than 55 minutes.  ____________________________ Clovis Pu. Lenore Manner, MD amd:slb D: 01/28/2012 02:46:28 ET     T: 01/28/2012 07:54:08 ET       JOB#: [id number] cc:  Antony Blackbird, MD Ellin Saba MD ELECTRONICALLY SIGNED 01/28/2012 22:17

## 2015-01-02 NOTE — Consult Note (Signed)
See NP note.  Pt has feeling of signif blockage in her throat/esophagus preventing things from going down for about 4 days. Will cancel barium study and do EGD tomorrow.   Electronic Signatures: Manya Silvas (MD)  (Signed on 04-Mar-13 19:06)  Authored  Last Updated: 04-Mar-13 19:06 by Manya Silvas (MD)

## 2015-01-02 NOTE — Discharge Summary (Signed)
PATIENT NAME:  [name], [name] MR#:  [id number] DATE OF BIRTH:  1955-12-12  DATE OF ADMISSION:  01/28/2012 DATE OF DISCHARGE:  01/30/2012  DIAGNOSES:  1. Intractable nausea, vomiting, and abdominal pain, possibly secondary to gastroparesis flare.  2. Near syncope secondary to orthostasis symptoms.  3. Dehydration. 4. Diabetes.  5. Atypical chest pain.  6. Adrenal insufficiency.  7. Gastroesophageal reflux disease.  8. Chronic back pain.  9. Irritable bowel syndrome.  10. Diverticulosis. 11. Autonomic neuropathy.   DISPOSITION: The patient is being discharged home.   FOLLOW-UP: Followup with primary care physician, Dr. Reginia Forts, in one to two weeks after discharge.   DIET: Low sodium, 1,800 calorie ADA diet.  ACTIVITY: As tolerated.   DISCHARGE MEDICATIONS:  1. Reglan 5 mg q.i.d. before meals.  2. Humalog 15 units subcutaneously t.i.d. before meals.  3. Hydrocodone 5 milligrams b.i.d.  4. Phenergan 25 mg q.6 hours p.r.n.  5. Levemir 20 units subcutaneous before supper.  6. Norco 5/325, one tablet every six hours p.r.n. pain. 7. Metformin 500 milligrams b.i.d.  8. Carafate one gram per 10 ml/15 milliliters t.i.d. with meals. 9. Ambien 10 mg at bedtime.  10. Cyclobenzaprine 10 mg t.i.d.  11. Omeprazole 20 milligrams b.i.d.  12. Morphine sulfate 60 mg b.i.d.  13. Lopressor 50 mg daily.  RESULTS: Chest x-ray: No pneumonia or congestive heart failure, basal atelectasis. Abdominal ultrasound: Heterogenous liver, prior cholecystectomy. Alpha-fetoprotein normal. Urinalysis showed no evidence of infection. CBC: Normal white count, normal hemoglobin, platelets 144. Cardiac enzymes normal. Glucose 406, BUN 11, creatinine 0.67, sodium 139, potassium 3.9. The rest of the CMP normal. LFTs showed AST of 55, otherwise normal.  HOSPITAL COURSE: The patient is a 59 year old female with past medical history of diabetes, autonomic neuropathy, due to diabetes, gastroparesis, who presented with  nausea, vomiting, and abdominal pain. The patient was admitted with a diagnosis of possible gastroparesis flare and starting on a clear liquid diet and antiemetics including Phenergan, Zofran and Reglan. She was also started on Morphine for pain control. With conservative management, the patient's symptoms improved. She was able to tolerate a regular diet by the time of discharge. Her abdominal ultrasound showed a heterogenous liver consistent with possible fatty liver with malignancy. An alpha-fetoprotein was checked and was normal. The patient also had near syncope. She has a history of near syncope due to orthostasis due to autonomic neuropathy. She had some mild dehydration which resolved with IV fluid hydration. Her diabetes remained controlled during the hospitalization. She complained of some atypical chest pain which was felt to be due to her recent bronchitis. Her cardiac enzymes have been negative. The rest of her medical problems remain stable. She was ambulating and tolerating a regular diet by the time of discharge.   TIME SPENT: 45 minutes.   ____________________________ Cherre Huger, MD sp:ap D: 01/30/2012 15:22:40 ET T: 01/31/2012 14:25:33 ET JOB#: [id number]  cc: Cherre Huger, MD, <Dictator> Renette Butters. Tamala Julian, MD Cherre Huger MD ELECTRONICALLY SIGNED 02/01/2012 13:59

## 2015-01-02 NOTE — Consult Note (Signed)
Brief Consult Note: Diagnosis: Dysphagia.   Patient was seen by consultant.   Comments: Barium swallow ordered by Dr. Vira Agar. Minimize hepatoxic medication. Full note dictated.  Electronic Signatures: Gershon Mussel (NP)  (Signed 04-Mar-13 16:46)  Authored: Brief Consult Note   Last Updated: 04-Mar-13 16:46 by Gershon Mussel (NP)

## 2015-01-02 NOTE — Consult Note (Signed)
Dr. Candace Cruise saw patient in office on March 1 and he will pick up this patient from me today.  Electronic Signatures: Manya Silvas (MD)  (Signed on 06-Mar-13 14:43)  Authored  Last Updated: 06-Mar-13 14:43 by Manya Silvas (MD)

## 2015-01-02 NOTE — Discharge Summary (Signed)
PATIENT NAME:  [name], [name] MR#:  [id number] DATE OF BIRTH:  [birth year]  DATE OF ADMISSION:  11/11/2011 DATE OF DISCHARGE:  11/16/2011  ADDENDUM:  The patient was also complaining of  dysphagia . GI was consulted and they did an upper GI endoscopy that showed the patient had small erosions in the proximal esophagus, esophagitis, and some gastric metaplasia. The patient has been started on PPI twice a day and also has been started on Carafate suspension.   ____________________________ Mena Pauls, MD ag:bjt D: 11/16/2011 16:14:34 ET T: 11/17/2011 14:25:06 ET JOB#: [id number]  cc: Mena Pauls, MD, <Dictator> Morton Peters., MD Mena Pauls MD ELECTRONICALLY SIGNED 11/21/2011 16:25

## 2015-01-02 NOTE — Consult Note (Signed)
PATIENT NAME:  [name], RACKERS MR#:  [id number] DATE OF BIRTH:  01-Nov-1955  DATE OF CONSULTATION:  11/12/2011  REFERRING PHYSICIAN:  Dr. Leslye Peer  CONSULTING PHYSICIAN:  Dr. Herbie Baltimore Elliott/Norman Piacentini A. Jerelene Redden, ANP  REASON FOR CONSULTATION: Dysphagia.   HISTORY OF PRESENT ILLNESS: This 59 year old patient with history of gastroesophageal reflux disease, esophageal ulcer, gastroparesis, history of Nissen fundoplication, and irritable bowel syndrome presents with problems with vomiting and syncope. The patient reports about five days ago she started having vomiting, but on further questioning it is food that is sticking in the throat. She says she has not been able to swallow or eat normally. She denies odynophagia and denies any nausea. She says she has not been heaving up stomach contents; it has been more of  spitting up what has been stuck in her throat. She said she has really not had this problem in the past. She denies antibiotic therapy. The patient was started on empiric nystatin yesterday and has noted no improvement.  In addition to the episode of vomiting, she also reported a bout of diarrhea and thought she was getting the flu like her son had. The patient did have some problems with weakness, unable to eat or drink normally, and was having some falls and syncope. She presented to the hospital with hemoglobin A1c of 14.7. She is off of her PPI, domperidone, and Reglan. The patient was last seen by Dr. Derrill Kay or one of his colleagues,  her gastroparesis specialist, in 2011 and had been advised to improve glycemic control, decrease  narcotic use as that can exacerbate gastric motility disorders, and to continue with her omeprazole b.i.d.  GI is asked to see the patient regarding further evaluation and management.   PAST MEDICAL HISTORY:  1. History of multiple admissions for gastroparesis, ileus, partial small bowel obstruction.  2. Ileus thought to be due to pain medication.  3. Partial small bowel  obstruction thought to be due to lysis of adhesions.  4. Diabetes mellitus type 1. Hemoglobin A1c 14.7. The patient is off of her NovoLog as of 6 to 8 months ago as she reports trouble getting the medication from one of her new specialists. She does take Levemir 80 mg b.i.d.  5. Questionable history of cirrhosis.  6. History of pancreatitis.  7. Gastroesophageal reflux disease.  8. History of gastritis, EGD 2012 showed esophageal ulcer.  9. Irritable bowel syndrome.  10. Chronic pain syndrome with degenerative disk disease.  11. Depression, anxiety. 12. Hypothyroidism.  13. Diverticulosis/diverticulitis. Colonoscopy January 2012 with extensive diverticulosis, colonic polyp, and tubulovillous adenomatous polyp, and a single colonic angiodysplastic lesion.  14. Glaucoma.  PAST SURGICAL HISTORY:  1. Uterine cancer status post hysterectomy.  2. Cholecystectomy.  3. Nissen fundoplication with hiatal hernia repair.  4. Lysis of  adhesions.  MEDICATIONS: 1. Ambien 10 mg at bedtime.  2. Cyclobenzaprine 10 mg daily.  3. Levemir 80 units b.i.d.  4. Lidoderm 5% patch 2 to 3 times per day as needed.  5. MS Contin 60 mg twice daily.  6. Promethazine 25 mg, available not utilizing.  7. Metoprolol succinate 50 mg daily.  8. Patient is supposed to be on NovoLog 20 units before meals. Has been off that medication 6 to 8 months.  9. She has Reglan listed. She has been off of that for many months.   ALLERGIES: Tetanus, prednisone, codeine, steroids, adhesive tape, Cymbalta.   HABITS: Negative tobacco or alcohol. Lives with her husband. Recently moved.   FAMILY HISTORY: Mother with  breast cancer, skin cancer. Father with hypertension.  The patient denies colon cancer.   REVIEW OF SYSTEMS: 10 systems reviewed, negative with the exception as noted in history. Says she has an arrhythmia, but has been stable without chest pain or shortness of breath. The patient reports she has never had a history of  yeast in the past and denies exposure to antibiotic therapy.   PHYSICAL EXAMINATION:  VITAL SIGNS: Temperature 97.7, pulse 116, respirations 16, blood pressure 93/69 with pulse oximetry on room air 94%.   GENERAL: Older than stated age 46 female resting in bed, talking with her husband in no acute distress.   HEENT: Head is normocephalic. Conjunctivae pink. Sclerae are anicteric. Oral mucosa shows tongue is red from juice.    NECK: Supple without lymphadenopathy or thyromegaly. Trachea is midline.   HEART: Heart tones S1, S2. Tachycardia noted.   LUNGS: Clear to auscultation. Respirations are eupneic.   ABDOMEN: Soft, flat, nontender in all quadrants.   RECTAL: Hemoccult shows no stool in the vault, smears on the gloved finger are heme negative. The patient reports no bowel movement in three days. Normal sphincter tone. No internal masses.   EXTREMITIES: Lower extremities without edema, cyanosis, or clubbing.   SKIN: Warm and dry without rash.   NEUROLOGIC: Patient is alert and oriented, interacting appropriately. Strength is equal bilaterally, assist with position changes.   PSYCHIATRIC: Affect and mood within normal. The patient is smiling, conversive, interacting within normal.   LABORATORY, DIAGNOSTIC, AND RADIOLOGICAL DATA: Admission blood work with glucose 297, BUN 39, creatinine 0.89. Lipase 59, albumin 3.3, total bilirubin 1.6, alkaline phosphatase 200, AST 180, ALT 358. CPK, MB, troponin negative times three. TSH 2.19. WBC 23.9. Hemoglobin 19, hematocrit 56.8, platelet count 146, MCV 19, MCH is 32.3. Influenza swab is negative. Urine culture is positive for staph aureus.  Repeat laboratory studies 11/12/2011 with glucose 95, BUN 27, creatinine 0.53, sodium 142, potassium 3.1, magnesium 1.6, triglycerides 149. Hemoglobin A1c 14.7, albumin 2.4, total bilirubin 0.9, alkaline phosphatase 133, AST 115, ALT 264. WBC 12.7, hemoglobin 15, platelet count 102.   RADIOLOGY: Chest  x-ray, single view 11/11/2011 shows lungs were clear. There is an interval placement of central venous catheter.   Abdominal ultrasound for nausea, vomiting, and elevated  liver enzymes shows nonobstructing stone in the right kidney, chronic enlargement of the common bile duct. Gallbladder surgically absent. Common bile duct was 10.1 cm? which is not a new finding.   CT scan of the head without contrast for fall and syncope showed no acute intracranial abnormality.   Outpatient labs 2011 with liver panel, alkaline phosphatase 127 to 141 range, total bilirubin 1.5, direct bilirubin 0.3, AST 46, ALT 57,  ANA direct negative, active smooth muscle antibody is negative at 16, mitochondrial antibody negative at 8.6. Hepatitis A, AB-. B surface antigen negative. Hepatitis C virus negative. Alpha-1 antitrypsin 156 mm. Celiac panel negative with slight elevation in immunoglobulin A 421.    IMPRESSION:  Patient admitted with poor appetite, epigastric pain, nausea, and vomiting. She describes some dysphagia with liquids and solids, and states she is not actually vomiting her stomach contents. She is vomiting what is in her throat. Appears to be feeling better today. She started nystatin yesterday, still having some difficulty swallowing. Barium swallow has been ordered by Dr. Vira Agar, has not yet been completed. EGD last year showed esophageal ulcer. The patient has been off of her proton pump inhibitor medication. She has been off of her domperidone and Reglan.  She states from a gastroparesis point of view she has actually done pretty well the last year. She has come in with elevated hemoglobin A1c of 14.7 off of NovoLog for 6 to 8 months.  The patient also came in with markedly elevated liver transaminases, improved overnight, and abdominal ultrasound showing stable chronic enlargement of the common bile duct. Elevation in liver transaminases have been mild in the past and worked up with negative findings. Slight  elevation in immunoglobulin with celiac panel could be re-evaluated for stability with SPIEP.   PLAN:  We will see what the barium swallow shows. Concern that the patient could have esophageal ulcer or other ulcer disease off of her proton pump inhibitor medication.  The patient needs to be started back on her proton pump inhibitor b.i.d. Continue to monitor liver enzymes, consider SIEP. We will discuss the case further with Dr. Vira Agar in collaboration of care.      These services provided by Denice Paradise, MS, APRN, [facility], ANP under collaborative agreement with Dr. Gaylyn Cheers.    ____________________________ Janalyn Harder. Jerelene Redden, ANP kam:bjt D: 11/12/2011 16:42:04 ET T: 11/12/2011 17:48:49 ET JOB#: [id number]  cc: Joelene Millin A. Jerelene Redden, ANP, <Dictator> Janalyn Harder. Sherlyn Hay, MSN, ANP-BC Adult Nurse Practitioner ELECTRONICALLY SIGNED 11/13/2011 8:16

## 2015-01-02 NOTE — H&P (Signed)
PATIENT NAME:  [name], [name] MR#:  [id number] DATE OF BIRTH:  [birth year]  DATE OF ADMISSION:  11/11/2011  REFERRING PHYSICIAN: Robb Matar, MD    PRIMARY CARE PHYSICIAN: Antony Blackbird, MD in [place]: Multiple issues of primarily syncope, nausea, vomiting, diarrhea, and abdominal pain.   HISTORY OF PRESENT ILLNESS: [name] is an unfortunate 59 year old woman with history of diabetes, gastroparesis, history of ileus thought to be related to pain medications, bowel obstruction, irritable bowel syndrome, esophageal ulcer and gastritis, depression, anxiety, chronic pain, hypothyroidism, who presents with reports of recurrent syncopal episodes. Per patient, approximately 4 to 5 days ago she began feeling ill, developing nausea, vomiting, diarrhea, abdominal pain. She reports that her family members had been stricken with influenza and thought that she had the same issue. The patient realizes that with stopping p.o. intake her symptoms improved; therefore, she stopped eating 2 to 3 days ago, at which point she progressed to having episodes of lightheadedness and dizziness with chest pain and palpitations preceding syncope and collapse. She fell on her face the first episode. It was witnessed by her children apparently; however, the patient did not come in for evaluation. She continued to have recurrent syncopal episodes of at least 2 to 3 times. Her last one was last night. She reports that her son also again saw her pass out, and it lasted maybe 15 to 20 minutes. She reports having poor ambulatory effort, having diffuse pain from her baseline. She reports feeling shaky also before passing out. No prior episodes of syncope. She denies any subjective fevers. No bloody stools or bloody vomit. She does repeatedly report that she is having memory issues but is able to provide quite a detailed history during evaluation.   PAST MEDICAL HISTORY:  1. Multiple admissions for gastroparesis.   2. History of ileus thought to be related to pain medications.  3. History of small bowel obstruction, status post lysis of adhesions.  4. Diabetes type 1.  5. Questionable history of cirrhosis. No prior documentation.  6. History of pancreatitis.  7. Gastroesophageal reflux disease.  8. History of gastritis, last EGD in January of 2012 with esophageal ulcer.  9. Irritable bowel syndrome.  10. Chronic pain syndrome with chronic abdominal pain and history of chronic back pain related to degenerative disk disease and bulging disk.   11. Depression/anxiety.  12. Hypothyroidism.  13. Diverticulosis and diverticulitis. Colonoscopy in January of 2012 with diverticulosis, colonic polyp, status post resection, and one colonic angiodysplastic lesion.  14. Glaucoma.   PAST SURGICAL HISTORY:  1. Uterine cancer, status post hysterectomy, per patient.  2. Cholecystectomy.  3. Hiatal hernia.  4. Nissen fundoplication.  5. Lysis of adhesions as above.   ALLERGIES: Tetanus, prednisone, codeine, steroids, adhesive tape, Cymbalta.   MEDICATIONS:  1. Ambien 10 mg at bedtime.  2. Cyclobenzaprine 10 mg daily.  3. Levemir 80 units b.i.d.  4. Lidoderm 5% patch 2 to 3 times per day as needed.  5. Reglan 5 mg q.i.d.  6. MS Contin 60 mg b.i.d.  7. NovoLog 20 units before meals.  8. Promethazine 25 mg, 1 tablet every 4 to 6 hours as needed.  9. Metoprolol succinate 50 mg daily.   NOTE: Her list does not contain omeprazole, Synthroid, or Zoloft.   SOCIAL HISTORY: She lives with her husband. She denies any tobacco, alcohol or drug use.   FAMILY HISTORY: Father with hypertension. Her mother had diabetes, breast cancer and skin cancer.    REVIEW  OF SYSTEMS: CONSTITUTIONAL: Endorses nausea and vomiting. EYES: History of glaucoma. ENT: No epistaxis or discharge. RESPIRATORY: No hemoptysis. CARDIOVASCULAR: She endorses chest pressure before her syncopal episode and palpitations. GASTROINTESTINAL: Endorses  nausea, vomiting, diarrhea, abdominal pain. No hematemesis or melena. GU: No dysuria or hematuria. ENDOCRINE: No polyuria or polydipsia.  HEMATOLOGIC: No easy bleeding. SKIN: No ulcers. MUSCULOSKELETAL: She has pain all over with history of chronic low back pain. NEUROLOGICAL: She has generalized weakness. PSYCHIATRIC: She denies any suicidal ideation or anxiety. She reports that she has sensation of doom before her syncopal cells.   PHYSICAL EXAMINATION:  VITAL SIGNS: Temperature 98.1, pulse 116, respiratory rate 22, blood pressure 113/70, sating at 96% on room air.   GENERAL: Lying in bed in no apparent distress.   HEENT: Normocephalic, atraumatic. Pupils are equal, symmetric, anicteric. Nares without discharge. She has dry mucous membrane.   NECK: Soft and supple. No adenopathy or JVP.   CARDIOVASCULAR: Tachycardic. No murmurs, rubs, or gallops.   LUNGS: Clear to auscultation bilaterally. No use of accessory muscles or increased respiratory effort.   ABDOMEN: Soft, positive bowel sounds. She has tenderness on the epigastric area. No rebound or guarding.   EXTREMITIES: No edema. Dorsal pedis pulses intact.   MUSCULOSKELETAL: No joint effusion.   SKIN: No ulcers.   NEUROLOGICAL: No dysarthria or aphasia. She has symmetrical strength. No focal deficits.   PSYCHIATRIC: She is alert and oriented, answering questions appropriately. She does have flat affect; however, intermittently she reports having problems with memory.    PERTINENT LABORATORY, DIAGNOSTIC AND RADIOLOGICAL DATA:  Lipase 59, sodium 140, potassium 3.4, chloride 99, carbon dioxide 22, anion gap 19. WBC 23.9, hemoglobin 19, hematocrit 56.8, platelets 146, MCV 97. Glucose 263, BUN 39, creatinine 0.89, calcium 9.4, total bilirubin 1.6, alkaline phosphatase 200, ALT 358, AST 180, total protein 7.2.  Troponin less than 0.02. CK 98, MB 3.2.  EKG with sinus tachycardia. No ST elevation or depression.  CT of the head with no  acute findings.  Right upper quadrant ultrasound with nonobstructing right renal calculus. There is mild common bile duct prominence which may relate to postcholecystectomy state and is stable from the comparison examination.   ASSESSMENT AND PLAN: [name] is a 59 year old woman with a history of diabetes and multiple admissions for management of gastroparesis, ileus, hypothyroidism, irritable bowel syndrome, anxiety, chronic pain, presenting with recurrent presyncopal and syncopal episodes of abdominal pain, nausea, vomiting, diarrhea, generalized weakness and chest pain.   1. Syncope: Likely in the setting of dehydration and volume depletion. The patient was noted to be hypotensive during evaluation. Her blood pressure dropped to 70s/40s. Her CT of the head is unrevealing. With her reports of memory deficit, although inconsistent with her evaluation and no evidence of short-term memory deficit by evaluation, we will have Psychiatry evaluate as there may be some component of psychiatric issues. If no improvement, consider MRI of the brain. I will continue IV fluids, obtain daily orthostatics. Her sinus tachycardia is also likely in the setting of dehydration. We will hold her metoprolol for now. She reports her chest pain and palpitations and sense of doom prior to her collapse.  She will be maintained on telemetry. Cycle cardiac enzymes and obtain an echocardiogram. As above, we will have psychiatrist evaluate.  2. Abdominal pain, nausea, vomiting, diarrhea, with acute flare gastroparesis: She was last admitted for a similar issue in September 2012. EGD results as above. Colonoscopy results as above in January 2012. We will restart on PPI  b.i.d. Continue symptom control. We will use Zofran and Phenergan and also use Reglan IV. We will get a three-view abdomen as she does have history of ileus and small bowel obstruction. We will resume her morphine with close following. Questionable history of cirrhosis.  No documentations in our records, but the record that she brings in report cirrhosis. Her transaminitis is likely in the setting of dehydration. I will continue to follow after fluid repletion, and her right upper quadrant ultrasound is unrevealing.  3. Leukocytosis: Likely in the setting of volume depletion with hemoconcentration as well. Send urinalysis, urine culture as above, three-view abdomen.  4. Hypothyroidism: The patient's medication list does not include Synthroid supplementation, but she does have history of being on a supplement. It  could be that her husband did not document this. We will need to verify. We will send a TSH.  5. Diabetes: Decrease her Levemir, start on sliding scale insulin. Can resume her NovoLog when she is able to take p.o. adequately.  6. Prophylaxis: With Lovenox and Protonix.   TIME SPENT: Approximately 50 minutes spent on patient care.   ____________________________ Rita Ohara, MD ap:cbb D: 11/11/2011 04:17:14 ET T: 11/11/2011 08:49:19 ET JOB#: [id number]  cc: Brien Few Cariah Salatino, MD, <Dictator> [facility], Collie Siad, MD  Rita Ohara MD ELECTRONICALLY SIGNED 12/06/2011 23:54

## 2015-01-02 NOTE — Consult Note (Signed)
Pt with small erosions in esophagus in proximal esophagus, also gastric metaplasia, no signif narrowing. No need for dilatation. Can advance to clear liquids then full liquids as desired.  carafate suspension would be beneficial.  Electronic Signatures: Manya Silvas (MD)  (Signed on 05-Mar-13 12:17)  Authored  Last Updated: 05-Mar-13 12:17 by Manya Silvas (MD)

## 2015-01-02 NOTE — Consult Note (Signed)
PATIENT NAME:  [name], [name] MR#:  [id number] DATE OF BIRTH:  [birth year]  DATE OF CONSULTATION:  11/15/2011  REFERRING PHYSICIAN:  Mena Pauls, MD CONSULTING PHYSICIAN:  A. Lavone Orn, MD  CHIEF COMPLAINT: Uncontrolled diabetes and low cortisol.   HISTORY OF PRESENT ILLNESS: This is a 59 year old female seen in consultation for uncontrolled diabetes and low cortisol. The patient is known to me from one clinic visit back in October 2011 when she was sent for uncontrolled diabetes. She has a long history of uncontrolled diabetes and her most recent Hemoglobin A1c on 11/12/2011 measured 14.7%. Her diabetes is complicated by polyneuropathy. Her outpatient regimen is Levemir 80 units twice a day. In the past, she was taking prandial NovoLog; however, she has not been taking any NovoLog for the last 5 to 6 months. She has been seeing an endocrinologist associated with [facility] in [place] who prescribed NovoLog 20 units before meals; however, she has had difficulty with insurance getting coverage for that medication. When I saw her in October 2011, I prescribed metformin, however, it is not clear that she ever took that. She claims to be checking her sugars twice daily but also states her glucometer is not working. She believes her blood sugars have been in the range of 200 to 300 and she acknowledges that this is "very high". Current inpatient regimen includes Levemir 40 units twice a day. In the last 24 hours her sugars have been in the range of 227 to 340 and she has required a total of 28 units on her sliding scale. She states she is very interested in getting her blood sugars better controlled and is willing to do anything including the restarting of metformin.   On admission, she had been reporting syncope, nausea, vomiting, and abdominal pain. Nausea and vomiting have improved. She has no longer had abdominal pain. She has been orthostatic. A cortisol level on 11/13/2011 at 4:40 a.m. was  low at 1. She denies any recent use of steroids and in fact states she is allergic to steroids. She has had no recent head or abdominal trauma. She does report lightheadedness on standing for the last 2 to 3 weeks. She also has had about a 12 pound weight loss. She has had no recent skin changes. Appetite is good and she cannot explain the weight loss other than the recent nausea/vomiting illness.   PAST MEDICAL HISTORY:  1. Type 2 diabetes.  2. Diabetic neuropathy with gastroparesis and peripheral neuropathy.  3. Gastroesophageal reflux disease.  4. History of peptic ulcer disease.  5. Chronic back pain.  6. Depression/anxiety disorder.  7. Hypothyroidism.  8. History of diverticulitis.  9. Glaucoma.   PAST SURGICAL HISTORY:  1. Uterine cancer status post hysterectomy.  2. Cholecystectomy.  3. Hiatal hernia.  4. Nissen fundoplication.  5. History of bowel obstruction status post lysis of adhesions.   ALLERGIES: Reportedly to tetanus, prednisone, codeine, steroids, adhesive tape and Cymbalta.  SOCIAL HISTORY: The patient lives with her husband in [place], [place]. She does not smoke cigarettes or drink alcohol.   FAMILY HISTORY: Positive for diabetes, breast cancer, and hypertension.   CURRENT INPATIENT MEDICATIONS:  1. Levemir 40 units subcutaneous twice a day.  2. Flexeril 10 mg three times daily.  3. Mag-Oxide 200 mg twice a day. 4. Reglan 5 mg four times daily before meals. 5. Midodrine 5 mg three times daily. 6. Morphine 60 mg every 6 hours. 7. Protonix 40 mg twice a day. 8. Carafate 1 gram  three times daily. 9. NovoLog sliding scale.  10. Normal saline 0.9% at 100 mL/hour.  REVIEW OF SYSTEMS: GENERAL: Weight loss as per history of present illness. No fevers. HEENT: She denies blurred vision. She denies headache. NECK: She denies neck pain or dysphagia. ENDOCRINE: She denies heat or cold intolerance. CARDIAC: She denies chest pain or palpitations. PULMONARY: She  denies cough or shortness of breath. ABDOMEN: She denies abdominal pain. She has had nausea. This has improved. She has good appetite. EXTREMITIES: She denies recent leg swelling. SKIN: She denies skin changes. She does have easy bruisability. NEUROLOGIC: She does report recent falls. She does report lightheadedness on standing.   PHYSICAL EXAMINATION:   VITAL SIGNS: Temperature 93/65 lying and 57/34 standing, pulse 97, respirations 18, blood pressure 112/81, and pulse oximetry 97% on room air.   GENERAL: White female in no distress.   HEENT: Extraocular movements are intact. Oropharynx is clear. Mucous membranes are dry.   NECK: Supple. No thyromegaly is present. A right IJ is in place.   CARDIAC: Regular rate and rhythm. No murmur.   PULMONARY: Clear to auscultation bilaterally. Good inspiratory effort.   ABDOMEN: Diffusely soft, nontender, and nondistended. Positive bowel sounds.   EXTREMITIES: No edema is present.   SKIN: No acanthosis nigricans is present. Calluses are present on both feet.   NEUROLOGIC: Decreased sensation to light touch on both feet. No tremor is present.   MUSCULOSKELETAL: No edema. Good strength, 5/5 throughout.   LABS/STUDIES: Labs dated 11/12/2011: Glucose 95, BUN 27, creatinine 0.53, sodium 142, potassium 3.1, chloride 107, CO2 23, calcium 8.2. Magnesium 1.6. Hemoglobin A1c 14.7%. Total protein 5.3, AST 115, ALT 264. TSH 2.19.   ASSESSMENT: This is a 59 year old female with long-standing uncontrolled diabetes with polyneuropathy as well as admission for syncope, now found to be orthostatic with a low morning cortisol concerning for adrenal insufficiency.  1. Diabetes, chronically uncontrolled, likely due in part to noncompliance with diabetes. I recommend we restart metformin at 1000 mg daily. We will start this at supper tonight. Increase Levemir to 80 units twice a day as you have done. Add Humalog rather than NovoLog, as she has had trouble with insurance  coverage of NovoLog. We will start with 12 units before meals. Change sliding scale to Humalog and make it slightly more aggressive, start with 3 units for sugar of 150-200 and add an additional 2 units for ever 50 over 201.  2. Low cortisol with orthostasis, likely due to adrenal insufficiency. I plan to perform a cosyntropin stimulation test and recheck a cortisol after cosyntropin is given. Should her post-stim cortisol remain low, then we may need to start steroids. I recognize she has had a reported allergy to prednisone, however I suspect she will be fine with hydrocortisone and we can start with a low dose to make sure she tolerates this. Should she definitely have adrenal insufficiency, then I will pursue further work-up of cause for the adrenal insufficiency as an outpatient.   Thank you for the kind request for consultation. I will follow along with you.  ____________________________ A. Lavone Orn, MD ams:slb D: 11/15/2011 14:44:46 ET T: 11/15/2011 15:01:23 ET JOB#: [id number]  cc: A. Lavone Orn, MD, <Dictator> Sherlon Handing MD ELECTRONICALLY SIGNED 11/19/2011 14:44

## 2015-01-02 NOTE — Consult Note (Signed)
I will sign off, reconsult if needed.  Electronic Signatures: Manya Silvas (MD)  (Signed on 06-Mar-13 14:43)  Authored  Last Updated: 06-Mar-13 14:43 by Manya Silvas (MD)

## 2015-01-02 NOTE — Op Note (Signed)
PATIENT NAME:  SYEDA, PRICKETT MR#:  [id number] DATE OF BIRTH:  01-May-1956  DATE OF PROCEDURE:  11/11/2011  PREOPERATIVE DIAGNOSIS: Nausea, dehydration.   POSTOPERATIVE DIAGNOSIS: Nausea, dehydration.   PROCEDURE: Insertion right internal jugular central venous catheter under ultrasound guidance.   SURGEON: Rodena Goldmann III, MD  ANESTHESIA: Local.   DESCRIPTION OF PROCEDURE: With the patient in the supine position, appropriately padded and positioned, neck turned to the left. Her neck was interrogated with the ultrasound and jugular vein identified. It was small but easily compressible and inconsistent with her dehydrated state. We put her in slight Trendelenburg position. 1% Xylocaine was injected into the area identified by the ultrasound. The area was prepped and draped previously with sterile dressings. The internal jugular vein was cannulated on a single pass under direct vision using ultrasound. The needle was identified in the lumen of the vein and free flow of blood return without difficulty. The wire was passed into the great vessel system without difficulty. The skin was nicked and the dilator inserted over the wire. The dilator was removed and the triple lumen catheter inserted over the wire into the great vessel system. It was sutured at 20 cm. It was flushed and aspirated without difficulty. Chest x-ray is pending. Sterile dressing was applied.   ____________________________ Micheline Maze, MD rle:cms D: 11/11/2011 19:22:12 ET T: 11/12/2011 09:13:05 ET JOB#: [id number]  cc: Rodena Goldmann III, MD, <Dictator> Renette Butters. Tamala Julian, [place] MD ELECTRONICALLY SIGNED 11/13/2011 6:19

## 2015-01-02 NOTE — Discharge Summary (Signed)
PATIENT NAME:  [name], [name] MR#:  [id number] DATE OF BIRTH:  March 22, 1956  DATE OF ADMISSION:  11/11/2011 DATE OF DISCHARGE:  11/16/2011  DIAGNOSES: 1. Syncope. 2. Orthostatic hypotension secondary to dehydration and  autonomic neuropathy may be secondary to diabetes. 3. Uncontrolled diabetes with hemoglobin A1c of 14.7. 4. Dysphagia status post upper gastrointestinal endoscopy secondary to esophagitis. 5. Suspect Adrenal insufficiency. 6. Sinus tachycardia.  7. Chronic pain.  8. History of gastroparesis. 9. History of pancreatitis.  10. Gastroesophageal reflux disease. 11. Anxiety, depression. 12. Hypothyroidism.   CONSULTATIONS:  1. Endocrinology, Dr. Gabriel Carina. 2. Surgery, Dr. Burt Knack.  3. GI, Dr. Vira Agar.   PROCEDURE DONE: Upper GI endoscopy.   HOSPITAL COURSE: A 59 year old female who has multiple medical problems of diabetes, uncontrolled, also history of gastroparesis, anxiety, depression, pancreatitis, gastroesophageal reflux disease, chronic pain. She presented with syncope, nausea, vomiting, diarrhea and abdominal pain. She was severely orthostatic and volume depleted when she came in. Her BP  was around 70 systolic when she came in. She was also tachycardic when she came in, maybe in the setting of dehydration initially. She had a very poor IV access so surgery was consulted and she had a central line placed. She was given aggressive IV hydration . At admission she had a CT head done that was essentially negative, her chest x-ray which was unremarkable, no acute cardiopulmonary disease. A chest x-ray was repeated after line placement that was negative. She had an ultrasound abdomen because of nausea, vomiting; they did not any acute intra-abdominal abnormality. Patient also had some elevated LFTs when she came in but that has improved. At admission she was found to have elevated ALT 358, AST 180, alkaline phosphatase 200, bilirubin 1.6. BUN is elevated at 39. Her white count was 23.9  with hemoglobin of 19. She was hemoconcentrated at that time. With IV hydration her hemoglobin has improved to 12.5 which is her baseline. White count 7.9. Her creatinine improved to 0.55. She is well hydrated. She got extensive hydration during the hospital stay. Her LFTs have improved. Alkaline phosphatase is normal. ALT 143 and AST normalized to 36. She still has some orthostatic hypotension. I think it is because of autonomic neuropathy secondary to her diabetes. A cortisol level was sent on the patient and was reported as 1 initially so she was suspected for adrenal insufficiency. Endocrinology was consulted. She had a cosyntropin stim test and the repeat cortisol after cosyntropin stem test was still on the lower side in the range of 10. She has been started on low dose hydrocortisone 5 mg b.i.d. but this may need to be advanced an outpatient when she follows with endocrinology. Her ANA was negative. Antimitochondrial antibody was normal. Anti-smooth muscle antibody was negative. She had hepatitis ABC panel that was negative. AFP normal at 2.6. She had an echocardiogram done because of syncope which shows ejection fraction hyperdynamic at 70%, mild TR. She has been advised to wear TED hose knee-high when she walks around and stands up for orthostatic hypotension. Physical therapy evaluated her and suggested home health physical therapy versus short-term rehab. Patient opted for home health physical therapy. Patient wants to go home. When she came in her urinalysis was essentially negative. She had urine cultures done initially which showed 50,000 colonies of staph aureus and coagulase negative Staphylococcus, most likely contaminant. Repeat urine culture has been negative. She had remained tachycardic during the hospital stay. Her metoprolol was stopped initially but that has been restarted because of her tachycardia. We cannot  go up on her heart rate controlling medications because of her hypotension. Her  heart rate sometimes jumps to 130-140 but it has come down to 120s now. Also because of her uncontrolled diabetes her diabetic regimen was adjusted. She is on Levemir 70 units b.i.d. right now. She was not taking NovoLog at home. Humalog has been started at 15 units t.i.d. and metformin 1000 mg daily. She also had some thrombocytopenia. It dropped down to 75,000 but it has improved to 88,000. This should be followed up as outpatient. Her LDL was noted to be 129 when she came in. Her lipase was normal. TSH was normal at 2.19. Her cardiac enzymes were negative.   MEDICATIONS AT DISCHARGE/HOME MEDICATIONS:  1. Metoprolol 50 mg daily extended release.  2. Metoclopramide 5 mg 4 times a day. 3. Cyclobenzaprine 10 mg 3 times a day.  4. Ambien 10 mg at bedtime. 5. Lidoderm patch apply for 12 hours, remove for 12 hours.  6. Promethazine 25 mg q.6 hours as needed. 7. MS Contin 60 mg b.i.d.  8. Acetaminophen hydrocodone 5/500 q.6 hours as needed.  9. Change Levemir to 70 units subcutaneous twice daily. 10. Humalog insulin 15 units subcutaneous 3 times daily before meals.  11. Hydrocortisone 5 mg p.o. b.i.d.  12. Metformin XR 1000 mg p.o. once daily at 5:00 p.m.  13. Augmentin 875 mg p.o. b.i.d. for three days. 14. Omeprazole 20 mg b.i.d.  15. Carafate 1 gram p.o. t.i.d. with meals.  16. Do not take NovoLog.  17. Patient was started on Unasyn and Augmentin because of suspected otitis media. I am going to give her seven days of antibiotics total.   NOTE: Advised to wear TED hose while standing and walking around and take off while lying down and at night.  DIET: Low sodium ADA soft diet, low cholesterol.   ACTIVITY: As tolerated. Home health physical therapy R.N. has been arranged for the patient.    CONDITION AT DISCHARGE: She feels well. T-max 98.1, heart rate 121, respiratory rate 20, blood pressure 101/69 lying down, standing up 100/69, saturating 93% on room air. Chest is clear. Minimal crackles  at the bases. Heart sounds regular, slightly tachycardic. Abdomen soft, nontender.   NOTE: I advised her that patient should stay because of her tachycardia but she says this is chronic tachycardia and she is adamant on leaving. Since her heart rate has improved since morning will discharge her but I told her that if there are any palpitations, chest pain, shortness of breath, dizziness she should come back to the ER or call her primary care physician.   FOLLOW UP: Follow up with Dr. Chapman Fitch early next week. Follow up CBC, BMP and LFTs at Dr. Siri Cole office. Follow up with Dr. Gabriel Carina, [facility] endocrinology, next week.         TIME SPENT ON DISCHARGE: 60 minutes.   ____________________________ Mena Pauls, MD ag:cms D: 11/16/2011 16:12:40 ET T: 11/17/2011 13:46:10 ET JOB#: [id number]  cc: Mena Pauls, MD, <Dictator> Dr. Chapman Fitch A. Lavone Orn, MD  Mena Pauls MD ELECTRONICALLY SIGNED 11/21/2011 16:24

## 2015-01-03 LAB — SURGICAL PATHOLOGY

## 2015-01-09 DIAGNOSIS — R55 Syncope and collapse: Secondary | ICD-10-CM

## 2015-01-09 HISTORY — DX: Syncope and collapse: R55

## 2015-01-09 NOTE — Consult Note (Signed)
Present Illness 59 year old WF with a history of type 2 diabetes,  paroxysmal atrial fibrillation, GERD, and cirrhosis who presents with abdominal pain.  She reports progressive LUQ pain for several days.  She describes this pain as severe with associated N/V.  She is admitted for further workup.  She is noted to have an elevated troponin.  She denies CP.  She says that she has chronic mild SOB which is describes as a sensation that she needs to "catch her breath".  She denies dizziness, presyncope, syncope or other concerns. She is unaware of any cardiac diagnosis though per report she has afib.  She is in sinus rhythm here.  She says that she saw a cardiologist "Years ago" and had "some tests" but does not recall the results. Presently, she is comfortable.  PAST MEDICAL HISTORY:   DM complicated by gastroparesis Afib (chads2vasc score is at least 2) GERD Cirrhosis HTN  FAMILY HISTORY:  Positive for hypertension and diabetes.   SOCIAL HISTORY:  denes alcohol, tobacco, or drug usage currently.   ALLERGIES:  INCLUDE PREDNISONE, STEROIDS, TETANUS AND DIPHTHERIA TOXOIDS AS WELL AS ADHESIVE TAPE.   HOME MEDICATIONS:  Include acetaminophen/hydrocodone 325/5 mg p.o. q. 4 hours as needed for pain, morphine extended release 60 mg p.o. b.i.d., Levemir 80 units subcutaneously b.i.d., NovoLog 20 units subcutaneously 3 times daily, Reglan 5 mg p.o. 4 times daily, Zofran 4 mg p.o. every 6 hours as needed for nausea and vomiting, Ambien 10 mg p.o. at bedtime, metoprolol 50 mg p.o. daily, erythromycin 5 mL 3 times daily with meals, pantoprazole 40 mg p.o. daily.   ROS- all systems are reviewed and negative except as per HPI above   Physical Exam:  GEN no acute distress, obese   HEENT Oropharynx clear   NECK supple   RESP normal resp effort  clear BS   CARD Regular rate and rhythm   ABD normal BS  + TTP in the LUQ, no rebound/ guarding   EXTR negative edema   SKIN normal to palpation   NEURO  motor/sensory function intact   PSYCH alert   Home Medications: Medication Instructions Status  cyclobenzaprine 10 mg oral tablet 1 tab(s) orally every 6 hours, As Needed for muscle spasms.  Active  morphine 60 mg/12 hours oral tablet, extended release 1 tab(s) orally 2 times a day Active  acetaminophen-HYDROcodone 325 mg-5 mg oral tablet 1 tab(s) orally every 4 hours, As Needed, pain , As needed, pain Active  erythromycin ethylsuccinate 200 mg/5 mL oral liquid 5 milliliter(s) orally 3 times a day (with meals) Active  Ceftin 500 mg oral tablet 1 tab(s) orally 2 times a day x 5 days Active  ondansetron 4 mg oral tablet 1 tab(s) orally every 6 hours, As Needed, Nausea/Vomiting - for Nausea, Vomiting Active  pantoprazole 40 mg oral delayed release tablet 1 tab(s) orally once a day Active  NovoLog 100 units/mL subcutaneous solution 20 unit(s) subcutaneous 3 times a day Active  zolpidem 10 mg oral tablet 1 tab(s) orally once a day (at bedtime) Active  metoprolol succinate 50 mg oral tablet, extended release 1 tab(s) orally once a day Active  metoclopramide 5 mg oral tablet 1 tab(s) orally 4 times a day Active  Lidoderm 5% topical film Apply topically to affected area 2-3 times a day, As Needed- for Pain  Active  Levemir 100 units/mL subcutaneous solution 80 unit(s) subcutaneous 2 times a day Active   Lab Results: Hepatic:  30-Jan-16 04:45   Bilirubin, Total  2.0  Alkaline Phosphatase 98  SGPT (ALT)  124  SGOT (AST)  141  Total Protein, Serum 6.8  Albumin, Serum  3.3  Routine Chem:  30-Jan-16 04:45   Glucose, Serum 84  BUN  29  Creatinine (comp) 0.74  Sodium, Serum 143  Potassium, Serum  3.2  Chloride, Serum  111  CO2, Serum 26  Calcium (Total), Serum  8.3  Osmolality (calc) 290  eGFR (African American) >60  eGFR (Non-African American) >60 (eGFR values <46m/min/1.73 m2 may be an indication of chronic kidney disease (CKD). Calculated eGFR, using the MRDR Study equation, is useful  in  patients with stable renal function. The eGFR calculation will not be reliable in acutely ill patients when serum creatinine is changing rapidly. It is not useful in patients on dialysis. The eGFR calculation may not be applicable to patients at the low and high extremes of body sizes, pregnant women, and vegetarians.)  Anion Gap  6  Cardiac:  29-Jan-16 00:48   Troponin I  0.83 (0.00-0.05 0.05 ng/mL or less: NEGATIVE  Repeat testing in 3-6 hrs  if clinically indicated. >0.05 ng/mL: POTENTIAL  MYOCARDIAL INJURY. Repeat  testing in 3-6 hrs if  clinically indicated. NOTE: An increase or decrease  of 30% or more on serial  testing suggests a  clinically important change)    14:40   Troponin I  0.53 (0.00-0.05 0.05 ng/mL or less: NEGATIVE  Repeat testing in 3-6 hrs  if clinically indicated. >0.05 ng/mL: POTENTIAL  MYOCARDIAL INJURY. Repeat  testing in 3-6 hrs if  clinically indicated. NOTE: An increase or decrease  of 30% or more on serial  testing suggests a  clinically important change)    20:32   Troponin I  0.55 (0.00-0.05 0.05 ng/mL or less: NEGATIVE  Repeat testing in 3-6 hrs  if clinically indicated. >0.05 ng/mL: POTENTIAL  MYOCARDIAL INJURY. Repeat  testing in 3-6 hrs if  clinically indicated. NOTE: An increase or decrease  of 30% or more on serial  testing suggests a  clinically important change)  Routine Hem:  30-Jan-16 04:45   WBC (CBC)  12.2  RBC (CBC) 4.81  Hemoglobin (CBC) 15.3  Hematocrit (CBC) 46.6  Platelet Count (CBC)  116  MCV 97  MCH 31.8  MCHC 32.8  RDW 14.1  Neutrophil % 67.0  Lymphocyte % 24.1  Monocyte % 7.9  Eosinophil % 0.6  Basophil % 0.4  Neutrophil #  8.1  Lymphocyte # 2.9  Monocyte #  1.0  Eosinophil # 0.1  Basophil # 0.1 (Result(s) reported on 09 Oct 2014 at 05:12AM.)   EKG:  EKG Interp. by me  NSR  poor R wave progression, prolonged QT, no ischemic changes    Adhesive: Rash  Tape: Rash  Steroids:  Other  Prednisone: Unknown  Tetanus /Diphtheria Toxoid: Unknown  Vital Signs/Nurse's Notes: **Vital Signs.:   30-Jan-16 11:45  Vital Signs Type Routine  Temperature Temperature (F) 98.7  Celsius 37  Temperature Source oral  Pulse Pulse 94  Respirations Respirations 19  Systolic BP Systolic BP 1536 Diastolic BP (mmHg) Diastolic BP (mmHg) 84  Mean BP 101  Pulse Ox % Pulse Ox % 96  Pulse Ox Activity Level  At rest  Oxygen Delivery Room Air/ 21 %    Impression A 59year old Caucasian female with history of type 2 diabetes, atrial fibrillation (chads2vasc score is 3) as well as a history of cirrhosis, presenting with abdominal pain.  She is unaware of prior cardiac issues.  1.  elevated troponin Her presentation is not consistent with an acute coronary syndrome and likely represents demand ischemia secondary to her acute medical illness.  I would favor a conservative approach.  Given cirrhosis, I would not advise anticoagulation at this time.  Obtain an echo to evaluate EF and exclude structural heart disaese given h/o afib and SOB.  No further inpatient CV workup is planned unless she decompensates or echo is markedly abnormal.  I think that an outpatient stress test once her acute abdominal issues is resolved would be advised.  OK to proceed with further GI testing as indicated without further CV workup unless she acutely decompensates.  2. afib she carries a h/o afib but is in sinus rhythm here her chads2vasc score is 3.  I would not anticoagulate until we can confirm documentation of afib.  given her cirrhosis, it may be best to avoid anticoagulation long term anyway.  3. htn .st  cardiology to see as needed over the weekend.  Drs Revonda Standard to see on monday. please call with questions.   Electronic Signatures: Coralyn Mark (MD)  (Signed 30-Jan-16 12:35)  Authored: General Aspect/Present Illness, History and Physical Exam, Home Medications, Labs, EKG , Allergies, Vital  Signs/Nurse's Notes, Impression/Plan   Last Updated: 30-Jan-16 12:35 by Coralyn Mark (MD)

## 2015-01-09 NOTE — Consult Note (Signed)
Chief Complaint:  Subjective/Chief Complaint Recurrent nausea/vominting today with more abdominal pain.   VITAL SIGNS/ANCILLARY NOTES: **Vital Signs.:   03-Feb-16 11:52  Vital Signs Type Recheck  Pulse Pulse 81  Systolic BP Systolic BP 564  Diastolic BP (mmHg) Diastolic BP (mmHg) 90  Mean BP 110   Brief Assessment:  GEN no acute distress   Cardiac Regular   Respiratory clear BS   Gastrointestinal upper abdominal tenderness   Lab Results: Routine Chem:  03-Feb-16 03:57   Lipase 83 (Result(s) reported on 13 Oct 2014 at 09:04AM.)  Magnesium, Serum  1.6 (1.8-2.4 THERAPEUTIC RANGE: 4-7 mg/dL TOXIC: > 10 mg/dL  -----------------------)  Glucose, Serum  61  BUN  3  Creatinine (comp)  0.56  Sodium, Serum  146  Potassium, Serum  3.1  Chloride, Serum  112  CO2, Serum 26  Calcium (Total), Serum 8.5  Anion Gap 8  Osmolality (calc) 285  eGFR (African American) >60  eGFR (Non-African American) >60 (eGFR values <13m/min/1.73 m2 may be an indication of chronic kidney disease (CKD). Calculated eGFR, using the MRDR Study equation, is useful in  patients with stable renal function. The eGFR calculation will not be reliable in acutely ill patients when serum creatinine is changing rapidly. It is not useful in patients on dialysis. The eGFR calculation may not be applicable to patients at the low and high extremes of body sizes, pregnant women, and vegetarians.)  Routine Hem:  03-Feb-16 03:57   WBC (CBC) 8.4  RBC (CBC) 4.01  Hemoglobin (CBC) 12.9  Hematocrit (CBC) 38.4  Platelet Count (CBC)  113  MCV 96  MCH 32.1  MCHC 33.5  RDW 13.4  Neutrophil % 63.2  Lymphocyte % 24.8  Monocyte % 9.6  Eosinophil % 2.0  Basophil % 0.4  Neutrophil # 5.3  Lymphocyte # 2.1  Monocyte # 0.8  Eosinophil # 0.2  Basophil # 0.0 (Result(s) reported on 13 Oct 2014 at 04:49AM.)   Assessment/Plan:  Assessment/Plan:  Assessment Nausea/vomiting/abdominal pain. No colitis. Gastroparesis.    Plan Change diet to low residue diet. INcrease reglan to [id number]. If she can tolerate new diet, then discharge tomorrow on higher dose of reglan. thanks.   Electronic Signatures: OhVerdie ShireMD)  (Signed 03-Feb-16 14:08)  Authored: Chief Complaint, VITAL SIGNS/ANCILLARY NOTES, Brief Assessment, Lab Results, Assessment/Plan   Last Updated: 03-Feb-16 14:08 by OhVerdie ShireMD)

## 2015-01-09 NOTE — Consult Note (Signed)
Appreciate cardiology input. Still with abdominal pain. Tender abdomen, mainly on left side. Will proceed with colonoscopy tomorrow afternoon. Keep patient on clear liquid diet rest of today. Will start bowel prep tonight.Pt requests either higher doses of MS Contin for pain control or adding dilaudid instead. Thanks.   Electronic Signatures: Verdie Shire (MD) (Signed on 31-Jan-16 09:01)  Authored   Last Updated: 31-Jan-16 10:14 by Verdie Shire (MD)

## 2015-01-09 NOTE — H&P (Signed)
PATIENT NAME:  [name], [name] MR#:  [id number] DATE OF BIRTH:  Jun 16, 1956  DATE OF ADMISSION:  10/08/2014  REFERRING PHYSICIAN:  Yetta Numbers. Karma Greaser, MD  PRIMARY CARE PHYSICIAN:  Cammie J. Fulp, MD    CHIEF COMPLAINT:  Abdominal pain.   HISTORY OF PRESENT ILLNESS:  This is a 59 year old Caucasian female with a history of type 2 diabetes, poorly controlled, insulin requiring, appears to be complicated by gastroparesis as well as paroxysmal atrial fibrillation, GERD, and cirrhosis, presenting with abdominal pain. She is an extremely poor historian and unwilling to provide much information. She presents with abdominal pain, which is generalized, described only as pain 9 to 10 out of 10 in intensity, no worsening or relieving factors, and associated nausea and vomiting, however, nonbloody, nonbilious emesis. Denies any change in bowel habits. Denies any further symptoms. Symptoms in total have been present for a few days. She is unsure of the exact duration and has had decreased p.o. intake for that time.   REVIEW OF SYSTEMS:  Difficult to obtain given the patient's unwillingness to provide any information.  CONSTITUTIONAL:  Denies fevers or chills. Positive for fatigue and weakness.  EYES:  Denies blurred vision, double vision, or eye pain.  EARS, NOSE, AND THROAT:  Denies tinnitus, ear pain, or hearing loss. RESPIRATORY:  Denies cough, wheeze, or shortness of breath.  CARDIOVASCULAR:  Denies chest pain, palpitation, or edema.  GASTROINTESTINAL:  Positive for nausea, vomiting, and abdominal pain as stated above. Denies any melena or hematochezia.  GENITOURINARY:  Denies dysuria or hematuria.  ENDOCRINE:  Denies nocturia or thyroid problems.  HEMATOLOGIC AND LYMPHATIC:  Denies easy bruising or bleeding. SKIN:  Denies rash or lesions.  MUSCULOSKELETAL:  Denies pain in the neck, back, shoulders, knees, or hips or arthritic symptoms.  NEUROLOGIC:  Denies paralysis or paresthesias.  PSYCHIATRIC:  Denies  anxiety or depressive symptoms.   Otherwise, full review of systems performed by me is negative.   PAST MEDICAL HISTORY:  Includes type 2 diabetes, poorly controlled, insulin requiring, complicated by gastroparesis and paroxysmal atrial fibrillation, gastroesophageal reflux disease without esophagitis, history of cirrhosis as well as hypertension.   FAMILY HISTORY:  Positive for hypertension and diabetes.   SOCIAL HISTORY:  Per documentation, no alcohol, tobacco, or drug usage currently.   ALLERGIES:  INCLUDE PREDNISONE, STEROIDS, TETANUS AND DIPHTHERIA TOXOIDS AS WELL AS ADHESIVE TAPE.   HOME MEDICATIONS:  Include acetaminophen/hydrocodone 325/5 mg p.o. q. 4 hours as needed for pain, morphine extended release 60 mg p.o. b.i.d., Levemir 80 units subcutaneously b.i.d., NovoLog 20 units subcutaneously 3 times daily, Reglan 5 mg p.o. 4 times daily, Zofran 4 mg p.o. every 6 hours as needed for nausea and vomiting, Ambien 10 mg p.o. at bedtime, metoprolol 50 mg p.o. daily, erythromycin 5 mL 3 times daily with meals, pantoprazole 40 mg p.o. daily.   PHYSICAL EXAMINATION: VITAL SIGNS:  Temperature 98.1, heart rate 107, respirations 14, blood pressure 173/79, saturating 99% on room air. Weight 62.9 kg, BMI 27.1.  GENERAL:  Well-nourished, well-developed Caucasian female in minimal-to-moderate distress given abdominal pain.  HEAD:  Normocephalic, atraumatic.  EYES:  Pupils are equal, round, and reactive to light. Extraocular muscles are intact. No scleral icterus.  MOUTH:  Dry mucosal membranes. Dentition is intact. No abscess noted.  EARS, NOSE, AND THROAT:  Clear without exudates. No external lesions.  NECK:  Supple. No thyromegaly. No nodules. No JVD.  PULMONARY:  Clear to auscultation bilaterally without wheezes, rales, or rhonchi. No use of accessory  muscles. Good respiratory effort.  CHEST:  Nontender to palpation.  CARDIOVASCULAR:  S1 and S2, tachycardic. No murmurs, rubs, or gallops. No edema.  Pedal pulses 2+ bilaterally.  GASTROINTESTINAL:  Soft. Tenderness to palpation in the periumbilical region with voluntary guarding; however, no motion tenderness or rebound tenderness. Positive bowel sounds.  MUSCULOSKELETAL:  No swelling, clubbing, or edema. Range of motion is full in all extremities.  NEUROLOGIC:  Cranial nerves II through XII are intact. No gross focal neurological deficit. Sensation is intact. Reflexes are intact.   PSYCHIATRIC:  Mood and affect are flattened. She is awake, alert, and oriented x 3. Insight and judgment are intact.   LABORATORY DATA:  Sodium is 139, potassium 3.9, chloride 104, bicarbonate 23, BUN 45, creatinine 0.87, glucose 367. LFTs:  Protein 8.4, bilirubin 0.59, AST 47, otherwise within normal limits. Troponin I is 0.83. WBC is 16, hemoglobin 17, and platelets are 144,000. Lactic acid is 2.9.   RADIOGRAPHIC IMAGING:  CT of the abdomen and pelvis revealing wall thickening of the distal transverse and proximal descending colon, which could reflect colitis.   ASSESSMENT AND PLAN:  A 59 year old Caucasian female with history of type 2 diabetes, insulin requiring, complicated by gastroparesis as well as a history of cirrhosis, presenting with abdominal pain.   1.  Abdominal pain, suspect a combination of colitis as well as gastroparesis. Intravenous fluid hydration. Zosyn for antibiotic coverage. We will consult gastroenterology. Initiate proton pump inhibitor therapy.  2.  Elevated troponin. We will place on telemetry and trend cardiac enzymes x 3.  3.  Type 2 diabetes, uncontrolled, with gastroparesis. We will check a hemoglobin A1c. Last measured back in 2014, A1c was 10.4. We will decrease her dose of Lantus given poor p.o. intake as well as add insulin sliding scale with q. 6 hour Accu-Cheks.  4.  Hypertension. Beta blockade.  5.  Venous thromboembolism prophylaxis with sequential compression devices.  CODE STATUS:  The patient is a full code.   TIME  SPENT:  45 minutes.    ____________________________ Aaron Mose. Hower, MD dkh:nb D: 10/08/2014 03:58:29 ET T: 10/08/2014 04:28:43 ET JOB#: [id number]  cc: Aaron Mose. Hower, MD, <Dictator> DAVID Woodfin Ganja MD ELECTRONICALLY SIGNED 10/10/2014 1:07

## 2015-01-09 NOTE — Consult Note (Signed)
Brief Consult Note: Diagnosis: abdominal pain.   Patient was seen by consultant.   Consult note dictated.   Comments: Appreciate consult for 59 y/o caucasian woman with hx liver cirrhosis of unk origin, DM, gastroparesis, atrial fibrillation, GERD, for evaluation of colitis.  States she was doing generally well until  3d ago. Developed epigastric pain, nausea, and dry heaving. Denies diarrhea. States last bm 3d ago. No known sick exposures, new meds, dietary changes. Feeling mentally dull- patient is difficult historian: cannot remember meds or many details. No further GI complaints. Appears to be on both reglan and e-mycin for gastroparesis but patient says she cant remember her meds. Has not been following with anyone for her cirrhosis. Had nissen fundoplication in past, cannot remember when. Last EGD 8/15 by Dr Candace Cruise done for dysphagia: surg site intact, exam appeared normal. Has also had EGDs in 2014 (Dr Allen Norris) with gastritis, 2013  (Dr Tiffany Kocher), and 2012 (Dr Candace Cruise).  Colonoscopy done 2012 with a rectal tubovillous adenoma, diverticula. Random biopsies negative.  On current CT abdomen/pelvis there is some distal transverse and prox descending colon inflammation: evidently this was found in 2014 as well: noted to be somewhat more prominent now. There was also a nodular appearance of the liver, Stomach appeared normal, there was some fat in the pancreas.  Imp/plan. 1. abdominal pain/gastroparesis: poss gastroparesis exacerbation. ?EES side effect Keep PPI. Schedule antiemetics presently to improve symptoms- will discuss further  with Dr Candace Cruise as  has had recent (& several) egds. would also benefit from better diabetes control- last a1c 9.                          2. Abnormal CT findings: this does not seem to be a new finding, but has increased per report. would recommend colonsocopy as clinically feasible: patient will need to be able to drink the prep. This may also contribute to her upper abdominal pain.                    3. Cirrhosis: with her mental fogginess will asses NH4 level for now                         4. Elevated troponin- agree w/ rpt, tele: may need cards cx..  Electronic Signatures: Stephens November H (NP)  (Signed 29-Jan-16 15:25)  Authored: Brief Consult Note   Last Updated: 29-Jan-16 15:25 by Theodore Demark (NP)

## 2015-01-09 NOTE — Consult Note (Signed)
PATIENT NAME:  [name], [name] MR#:  [id number] DATE OF BIRTH:  [birth year]  DATE OF CONSULTATION:  10/08/2014  REFERRING PHYSICIAN:  Valentino Nose, MD  CONSULTING PHYSICIAN:  Theodore Demark, NP  REASON FOR CONSULTATION:  GI consult for evaluation of colitis.   HISTORY OF PRESENT ILLNESS: Appreciate consult for 59 year old Caucasian woman with history of liver cirrhosis of unknown origin, diabetes, gastroparesis, atrial fibrillation, GERD for evaluation of colitis. States she was doing generally well until 3 days ago; developed epigastric pain, nausea, and dry heaving; denies diarrhea, states last bowel movement 3 days ago. No known sick exposures, new medications, dietary changes; feeling mentally dull, patient is a difficult historian and cannot remember medications or many details. No further GI complaints, appears to be on both Reglan and erythromycin for gastroparesis, but the patient says she cannot remember her medications presently; has not been following with anyone for her cirrhosis; had  Nissen fundoplication in the past, cannot remember when, last EGD, August 2015, by Dr. Candace Cruise, done for dysphasia, surgical site intact, exam appeared normal; had also EGDs in 2014, by Dr. Allen Norris, with gastritis; 2013, with Dr. Vira Agar; and 2012, by Dr. Candace Cruise; a colonoscopy done 2012, with a rectal tubovillous adenoma and diverticula. Random biopsies were negative.   On current CT of abdomen and pelvis there is some distal transverse and proximal descending colonic inflammation, this was evidently found in 2014, as well; however, noted to be somewhat more prominent now. There was also a nodular appearance of the liver, stomach appeared normal. There were some fat in the pancreas.   REVIEW OF SYSTEMS: Difficult to obtain due to the patient's mentation; however, her current abdominal and mental complaints seem to be her only ones presently. She denies chest pain and shortness of breath and swelling.   PAST MEDICAL  HISTORY: Type 2 diabetes, gastroparesis, atrial fibrillation, GERD, cirrhosis, hypertension.   FAMILY HISTORY: Hypertension, diabetes. The patient denies colorectal cancer, colon polyps, liver disease, or ulcers.   SOCIAL HISTORY: No alcohol, illicits, tobacco.   ALLERGIES: PREDNISONE AND OTHER STEROIDS, TETANUS, DIPHTHERIA TOXOIDS, ADHESIVE TAPE.   HOME MEDICATIONS: Norco 5/325 p.o. q. 4 h. p.r.n., morphine extended release 60 mg p.o. b.i.d., Levemir 80 units b.i.d., NovoLog 20 units subcutaneous t.i.d., Reglan 5 mg p.o. q.i.d., Zofran 4 mg p.o. q. 6 h. p.r.n., Ambien 10 mg p.o. at bedtime, metoprolol 50 mg p.o. daily, erythromycin 5 mL t.i.d. pantoprazole 40 mg p.o. daily.   DIAGNOSTIC DATA: Most recent labs, glucose 367, BUN 45, creatinine 0.87, sodium 139, potassium 3.9, chloride 104, GFR >60 calcium 9.0, magnesium 2.1, A1c 9, total protein 84, albumin 4, total bilirubin 1.9, ALP 115, AST 47, ALT 48, WBC 16, hemoglobin 17, hematocrit 51.4, platelets 144,000, PT 15.1, INR 1.2, troponin elevated at 0.83. Blood cultures negative so far x 2. CT today as noted above, additionally with a few nonobstructing renal stones, also had head CT with no acute intracranial pathology. Abdominal film with no free intra-abdominal air, questionable enteritis versus a partial obstruction; however, this was not seen on CT, as was noted that the small bowel was unremarkable.   VITAL SIGNS: Most recent vital signs, temperature 98.5, pulse 91, respiratory rate 20, blood pressure 179/93, SaO2 97% on room air.   PHYSICAL EXAMINATION:  GENERAL: A well-developed woman in no acute distress, resting comfortably.  HEENT: Normocephalic, atraumatic. Conjunctivae pink. Sclerae clear.  NECK: Supple. No thyromegaly, adenopathy or JVD.  CHEST: Respirations eupneic. Lungs clear.  CARDIAC: S1, S2, RRR. No  MRG. No appreciable edema.  ABDOMEN: Rounded. Bowel sounds x 4. Soft, nondistended, nontender. No guarding or rigidity,  peritoneal signs. No masses or organomegaly.  MUSCULOSKELETAL: MAEW x 4, does have some generalized weakness, mild in nature; no clubbing, cyanosis or edema; sensation appears to be intact.  NEUROLOGICAL: Alert, oriented x 3, cranial nerves II through XII intact, does have some forgetfulness; does have difficulty recalling details.  PSYCHIATRIC: Pleasant, calm, somewhat flat, memory as noted.   IMPRESSION AND PLAN: 1.  Abdominal pain/gastroparesis possible gastroparesis exacerbation. May be erythromycin side effect; this medication is generally not well tolerated on long-term use; agree with proton pump inhibitor scheduled antiemetics presently to improve symptoms; we will discuss further with Dr. Candace Cruise, as she has had several esophagogastroduodenoscopies, would also benefit from better diabetes control. Her last hemoglobin A1c was 9.  2.  Abnormal CT findings, the inflammation in the distal transverse and proximal descending colon does not seem to be a new finding, but has exacerbated some; would recommend colonoscopy as clinically feasible. Patient will need to be able to drink the prep. Additionally, this inflammation may contribute to her upper abdominal pain.  3.  Cirrhosis. With her mental fogginess, we will assess ammonia level for now.  4.  Elevated troponin. Agree with cycling these; telemetry; we will order an EKG, recommend cardiology consult.   Thank you very much for this consult.   These services were provided by Stephens November, MSN, [facility], in collaboration with Dr. Verdie Shire, MD, with whom I have discussed this patient in full.    ____________________________ Theodore Demark, NP chl:nt D: 10/08/2014 18:18:06 ET T: 10/08/2014 18:38:22 ET JOB#: [id number]  cc: Theodore Demark, NP, <Dictator> [place] SIGNED 10/25/2014 15:24

## 2015-01-09 NOTE — Consult Note (Signed)
Pt seen and examined. Please see C.London's notes. Fairly lethargic now. Unable to get much hx from patient. Increasing upper abdominal pain with nausea/vomiting. Known hx of gastroparesis and cirrhosis. Ammonia level checked and is normal. Hx of colon polyps in 2012. CT suggests colitis, perhaps ischemic in nature, based on distribution of colitis. Abdomen in epigastric and left side tender to palpation. Agree with zofran. Elevated troponin. Make sure symptoms are not cardiac in nature. Will eventually need colonoscopy once cleared by cardiology. Will follow. thanks.  Electronic Signatures: Verdie Shire (MD)  (Signed on 29-Jan-16 16:37)  Authored  Last Updated: 29-Jan-16 16:37 by Verdie Shire (MD)

## 2015-01-09 NOTE — Consult Note (Signed)
Still with abdominal pain. Colonoscopy to cecum was completely normal. There is no colitis as seen on CT. Random colon biopsies taken. Unclear as to etiology of abdominal pain. Diabetic neuropathy? Resume diet and meds. I will be out tomorrow. Will check back on Wed.  Electronic Signatures: Verdie Shire (MD)  (Signed on 01-Feb-16 15:58)  Authored  Last Updated: 01-Feb-16 15:58 by Verdie Shire (MD)

## 2015-01-09 NOTE — Discharge Summary (Signed)
PATIENT NAME:  [name], [name] MR#:  [id number] DATE OF BIRTH:  30-Jan-1956  DATE OF ADMISSION:  10/08/2014 DATE OF DISCHARGE:  10/14/2014  PRIMARY CARE PROVIDER:  Rodman Key. Fulp, MD  ADMISSION COMPLAINT:  Abdominal pain.  DISCHARGE DIAGNOSES: 1.  Colitis of transverse and descending colon.  2.  Diabetic gastroparesis.  3.  Elevated troponins without non-ST segment elevation myocardial infarction. 4.  Atrial fibrillation.  5.  Chronic pain syndrome.  6.  Cirrhosis.  7.  Depression.  8.  Diabetes mellitus type 2, uncontrolled, with hemoglobin A1c greater than 10.  9.  Hypertension.  10.  Diastolic congestive heart failure.   CONSULTATIONS:  1.  Lupita Dawn. Candace Cruise, MD, gastroenterology.  2.  Nelda Severe. Allred, MD, cardiology.   PROCEDURES:  1.  Colonoscopy performed 10/11/2014 by Dr. Candace Cruise, showing a normal-appearing colon. Biopsies pending, awaiting pathology results.  2.  A 2-D echocardiogram performed on 10/11/2014 shows left ventricular ejection fraction 65%-70%. Normal left systolic function. Impaired relaxation pattern of LV diastolic filling. Moderate left ventricular hypertrophy. Mildly dilated left atrium. Mildly elevated pulmonary artery systolic pressures.  3.  CT of the abdomen and pelvis with contrast, January 29, shows mild wall thickening along the distal transverse and proximal descending colon, which could reflect  mild infectious or inflammatory process. Although this is only minimally more prominent than 2014, could remain within normal limits. No significant diverticulosis. Hepatic cirrhosis, question of mild hepatic venous congestion. Bilateral nonobstructing renal stones.  4.  CT of the head without contrast, January 29, shows no acute intracranial pathology. Minimal partial opacification of the mastoid air cells bilaterally.  HISTORY OF PRESENT ILLNESS: This 59 year old Caucasian female with history of type 2 diabetes, poorly controlled, insulin requiring, and complicated by  gastroparesis, presents with abdominal pain. She describes 9-10/10 pain. No worsening or relieving factors. She has had significant nausea and vomiting. No diarrhea.   HOSPITAL COURSE BY PROBLEM:  Problem #1.  Colitis of the transverse and descending colons seen on CT scan: She did not have symptoms of diarrhea, but did have significant abdominal pain. She was treated with Zosyn initially and then was changed to Ciprofloxacin and Flagyl on February 2. She is being discharged on ciprofloxacin and Flagyl to complete a 10 day course. Her colonoscopy was negative for any signs of inflammation. Blood cultures were negative. There were no stool cultures and she did not have diarrhea. Antibiotic treatment is mostly prophylactic and to hopefully decrease inflammation in the colon. Problem #2.  Diabetic gastroparesis: Her nausea and vomiting are most likely related to acute on chronic gastroparesis.  She reports that she has had multiple flares such as this in the past. During the hospitalization an effort was made to decrease her chronic narcotics. She was restarted on medication she had been on prior to this admission for gastroparesis including erythromycin as well as Reglan.  She was followed by gastroenterology throughout the admission. At the time of discharge, she is tolerating a mostly normal, low residue diet. She has no residual nausea or vomiting.  Problem #3.  Elevated troponins without NSTEMI: Due to the left upper quadrant location of her abdominal pain, troponins were checked on admission and were found to be slightly elevated at 0.83, 0.55. She was never started on full dose anticoagulation. She was seen by cardiology who thought that her elevated troponins were most likely due to ischemia secondary to acute medical illness. A conservative approach was taken. She did have a 2-D echocardiogram to evaluate function.  She was cleared for further GI testing.  Problem #4.  Atrial fibrillation: Heart rate was  controlled throughout the hospitalization. She actually was in sinus rhythm throughout the hospitalization. She has a CHADS score of 3. Cardiology saw her and advised against initiating anticoagulation until this diagnosis could be confirmed.  Problem #5.  Chronic pain syndrome: The patient presented on very large doses of chronic pain medications including morphine XR 60 mg b.i.d. She is followed by pain specialist in the outpatient setting. Her doses were decreased upon admission due to altered mental status, and lethargy on presentation, and also due to nausea and vomiting. She did well on the lower dose of narcotics. She is being discharged on a slightly lower dose than her home dose and she is advised to follow up with her pain management specialist for tapering down on her opiate dosing.  Problem #6.  Cirrhosis: Stable throughout the hospitalization.  Problem #7.  Depression: Stable throughout hospitalization. No changes were made.  Problem #8.  Diabetes mellitus type 2, uncontrolled, with hemoglobin A1c greater than 10. Her blood sugars were well controlled in hospital on her discharge regimen of 40 units of Lantus twice a day. She was counseled regarding the importance of controlling her diabetes to prevent further complications. She will need to follow up with her primary care physician in the outpatient setting.  Problem #9.  Hypertension: Controlled on home regimen. No changes.  Problem #10.  Diastolic congestive heart failure: Seen on 2-D echocardiogram performed during this hospitalization. She does report intermittent ankle swelling, but no other severe symptoms of heart failure. She is on a beta blocker, ACE inhibitor. No diuresis was initiated as she did not seem volume overloaded.   DISCHARGE PHYSICAL EXAMINATION:  VITAL SIGNS: Temperature 98.2, pulse 92, respirations 18, blood pressure 148/91, oxygenation 97% on room air.  GENERAL: No acute distress.  CARDIOVASCULAR: Regular rate and  rhythm. No murmurs, rubs, or gallops. No peripheral edema. Peripheral pulses 2+.  RESPIRATORY: Lungs clear to auscultation bilaterally with good air movement.  ABDOMEN: Soft, some tenderness in the upper abdomen in the epigastric and left upper quadrant. Bowel sounds are normal. Tolerating regular diet.  PSYCHIATRIC: Alert, and oriented x 4 with good insight into her clinical condition. No signs of uncontrolled depression or anxiety. She is eager for discharge.   LABORATORY DATA: Sodium 138, potassium 4.1, chloride 107, bicarbonate 26, BUN 3, creatinine 0.68, glucose 290. LFTs: Total protein 5.7, albumin 2.7, bilirubin 0.8, alkaline phosphatase 78, AST 42, ALT 95. White blood cells 8.4, hemoglobin 12.9, platelets 113,000. MCV 96, INR 1.2. Blood cultures, no growth. UA with no signs of infection. Pathology report from colonoscopy biopsies is pending.   DISCHARGE MEDICATIONS: 1.  Omeprazole 20 mg 1 tablet daily.  2.  Ambien 5 mg 1 tablet once a day at bedtime.  3.  Dicyclomine 10 mg 1 capsule 4 times a day.  4.  Lisinopril 5 mg 1 tablet once a day.  5.  Lantus 100 units/mL subcutaneous solution 40 units twice a day.  6.  Metoprolol succinate ER 100 mg 1 tablet once a day.  7.  Tizanidine 4 mg 1 tablet orally every 6 hours as needed.  8.  MS Contin 100 mg/12 hours oral tablet 1/2 tablet every 12 hours for a total of 50 mg twice a day.  9.  Percocet 5/325, 1 tablet every 6 hours as needed for breakthrough pain.  10.  Metronidazole 500 mg 1 tablet every 8 hours.  11.  Metoclopramide 10 mg 1 tablet 3 times a day with meals.  12.  Ciprofloxacin 500 mg 1 tablet every 12 hours.  13.  Erythromycin ethylsuccinate 200 mg/5 mL oral liquid 5 mL twice a day with meals.   CONDITION: Stable.   DISPOSITION: Discharge to home with no further home health needs.   DISCHARGE INSTRUCTIONS:  DIET: Low-sodium, low-fat, low-cholesterol, ADA diet.   ACTIVITY LIMITATIONS: None.   TIME FRAME FOR FOLLOWUP: Please  follow up with your primary care physician within  1-2 days as scheduled. The patient states that she has already made the appointment for 2 days after discharge.    TIME SPENT ON DISCHARGE: 40 minutes.   ____________________________ [name]. Volanda Napoleon, MD cpw:LT D: 10/18/2014 15:22:13 ET T: 10/18/2014 17:32:09 ET JOB#: [id number]  cc: Barnetta Chapel P. Volanda Napoleon, MD, <Dictator> Cammie J. Chapman Fitch, MD Aldean Jewett MD ELECTRONICALLY SIGNED 10/20/2014 18:27

## 2015-01-09 NOTE — Consult Note (Signed)
Chief Complaint:  Subjective/Chief Complaint More alert today. Ammonia level normal. Troponin levels still elevated. Still with abdominal pain, which worsens after eating.   VITAL SIGNS/ANCILLARY NOTES: **Vital Signs.:   30-Jan-16 08:18  Temperature Temperature (F) 97.9  Celsius 36.6  Pulse Pulse 92  Respirations Respirations 20  Systolic BP Systolic BP 329  Diastolic BP (mmHg) Diastolic BP (mmHg) 90  Mean BP 111  Pulse Ox % Pulse Ox % 98  Oxygen Delivery Room Air/ 21 %   Brief Assessment:  GEN no acute distress   Cardiac Regular   Respiratory clear BS   Gastrointestinal diffuse abdominal tenderness   Lab Results:  Hepatic:  30-Jan-16 04:45   Bilirubin, Total  2.0  Alkaline Phosphatase 98  SGPT (ALT)  124  SGOT (AST)  141  Total Protein, Serum 6.8  Albumin, Serum  3.3  Routine Chem:  30-Jan-16 04:45   Glucose, Serum 84  BUN  29  Creatinine (comp) 0.74  Sodium, Serum 143  Potassium, Serum  3.2  Chloride, Serum  111  CO2, Serum 26  Calcium (Total), Serum  8.3  Osmolality (calc) 290  eGFR (African American) >60  eGFR (Non-African American) >60 (eGFR values <35m/min/1.73 m2 may be an indication of chronic kidney disease (CKD). Calculated eGFR, using the MRDR Study equation, is useful in  patients with stable renal function. The eGFR calculation will not be reliable in acutely ill patients when serum creatinine is changing rapidly. It is not useful in patients on dialysis. The eGFR calculation may not be applicable to patients at the low and high extremes of body sizes, pregnant women, and vegetarians.)  Anion Gap  6  Routine Hem:  30-Jan-16 04:45   WBC (CBC)  12.2  RBC (CBC) 4.81  Hemoglobin (CBC) 15.3  Hematocrit (CBC) 46.6  Platelet Count (CBC)  116  MCV 97  MCH 31.8  MCHC 32.8  RDW 14.1  Neutrophil % 67.0  Lymphocyte % 24.1  Monocyte % 7.9  Eosinophil % 0.6  Basophil % 0.4  Neutrophil #  8.1  Lymphocyte # 2.9  Monocyte #  1.0  Eosinophil #  0.1  Basophil # 0.1 (Result(s) reported on 09 Oct 2014 at 05:12AM.)   Assessment/Plan:  Assessment/Plan:  Assessment Colitis, gastroparesis. Abdominal pain. Elevated troponin levels.   Plan Continue Abx coverage. Moniter LFT as it is rising. Colonscopy if and when clearance from cardiology. thanks   Electronic Signatures: OVerdie Shire(MD)  (Signed 30-Jan-16 09:45)  Authored: Chief Complaint, VITAL SIGNS/ANCILLARY NOTES, Brief Assessment, Lab Results, Assessment/Plan   Last Updated: 30-Jan-16 09:45 by OVerdie Shire(MD)

## 2015-01-10 ENCOUNTER — Emergency Department (HOSPITAL_COMMUNITY): Payer: Medicare HMO

## 2015-01-10 ENCOUNTER — Observation Stay (HOSPITAL_COMMUNITY)
Admission: EM | Admit: 2015-01-10 | Discharge: 2015-01-12 | Disposition: A | Discharge status: Home / Self Care | Payer: Medicare HMO | Attending: Internal Medicine | Admitting: Internal Medicine

## 2015-01-10 ENCOUNTER — Encounter (HOSPITAL_COMMUNITY): Payer: Self-pay | Admitting: Emergency Medicine

## 2015-01-10 DIAGNOSIS — D696 Thrombocytopenia, unspecified: Secondary | ICD-10-CM | POA: Diagnosis not present

## 2015-01-10 DIAGNOSIS — R112 Nausea with vomiting, unspecified: Secondary | ICD-10-CM | POA: Diagnosis present

## 2015-01-10 DIAGNOSIS — E039 Hypothyroidism, unspecified: Secondary | ICD-10-CM | POA: Diagnosis not present

## 2015-01-10 DIAGNOSIS — R55 Syncope and collapse: Secondary | ICD-10-CM | POA: Diagnosis present

## 2015-01-10 DIAGNOSIS — K3184 Gastroparesis: Secondary | ICD-10-CM | POA: Insufficient documentation

## 2015-01-10 DIAGNOSIS — Z79899 Other long term (current) drug therapy: Secondary | ICD-10-CM | POA: Diagnosis not present

## 2015-01-10 DIAGNOSIS — I10 Essential (primary) hypertension: Secondary | ICD-10-CM | POA: Diagnosis not present

## 2015-01-10 DIAGNOSIS — Z79891 Long term (current) use of opiate analgesic: Secondary | ICD-10-CM

## 2015-01-10 DIAGNOSIS — I951 Orthostatic hypotension: Secondary | ICD-10-CM | POA: Diagnosis present

## 2015-01-10 DIAGNOSIS — E860 Dehydration: Secondary | ICD-10-CM | POA: Insufficient documentation

## 2015-01-10 DIAGNOSIS — I959 Hypotension, unspecified: Secondary | ICD-10-CM | POA: Diagnosis not present

## 2015-01-10 DIAGNOSIS — E119 Type 2 diabetes mellitus without complications: Secondary | ICD-10-CM | POA: Insufficient documentation

## 2015-01-10 DIAGNOSIS — G894 Chronic pain syndrome: Secondary | ICD-10-CM | POA: Diagnosis not present

## 2015-01-10 HISTORY — DX: Gastro-esophageal reflux disease without esophagitis: K21.9

## 2015-01-10 HISTORY — DX: Anxiety disorder, unspecified: F41.9

## 2015-01-10 HISTORY — DX: Personal history of other diseases of the digestive system: Z87.19

## 2015-01-10 HISTORY — DX: Syncope and collapse: R55

## 2015-01-10 HISTORY — DX: Malignant (primary) neoplasm, unspecified: C80.1

## 2015-01-10 LAB — COMPREHENSIVE METABOLIC PANEL
ALBUMIN: 2.8 g/dL — AB (ref 3.5–5.0)
ALT: 22 U/L (ref 14–54)
ANION GAP: 5 (ref 5–15)
AST: 27 U/L (ref 15–41)
Alkaline Phosphatase: 84 U/L (ref 38–126)
BUN: 16 mg/dL (ref 6–20)
CHLORIDE: 105 mmol/L (ref 101–111)
CO2: 26 mmol/L (ref 22–32)
CREATININE: 0.83 mg/dL (ref 0.44–1.00)
Calcium: 8.3 mg/dL — ABNORMAL LOW (ref 8.9–10.3)
GFR calc Af Amer: >60 mL/min (ref >60)
Glucose, Bld: 208 mg/dL — ABNORMAL HIGH (ref 70–99)
POTASSIUM: 3.9 mmol/L (ref 3.5–5.1)
SODIUM: 136 mmol/L (ref 135–145)
Total Bilirubin: 0.9 mg/dL (ref 0.3–1.2)
Total Protein: 5.6 g/dL — ABNORMAL LOW (ref 6.5–8.1)

## 2015-01-10 LAB — URINE MICROSCOPIC-ADD ON

## 2015-01-10 LAB — URINALYSIS, ROUTINE W REFLEX MICROSCOPIC
Bilirubin Urine: NEGATIVE
GLUCOSE, UA: NEGATIVE mg/dL
Ketones, ur: NEGATIVE mg/dL
Leukocytes, UA: NEGATIVE
Nitrite: NEGATIVE
Protein, ur: 30 mg/dL — AB
Specific Gravity, Urine: 1.018 (ref 1.005–1.030)
Urobilinogen, UA: 1 mg/dL (ref 0.0–1.0)
pH: 7 (ref 5.0–8.0)

## 2015-01-10 LAB — TROPONIN I: Troponin I: <0.03 ng/mL (ref <0.031)

## 2015-01-10 LAB — LIPASE, BLOOD: Lipase: 23 U/L (ref 22–51)

## 2015-01-10 MED ORDER — SODIUM CHLORIDE 0.9 % IV BOLUS (SEPSIS)
1000.0000 mL | Freq: Once | INTRAVENOUS | Status: AC
  Administered 2015-01-10: 1000 mL via INTRAVENOUS

## 2015-01-10 MED ORDER — SODIUM CHLORIDE 0.9 % IV BOLUS (SEPSIS)
500.0000 mL | Freq: Once | INTRAVENOUS | Status: AC
  Administered 2015-01-11: 500 mL via INTRAVENOUS

## 2015-01-10 NOTE — ED Provider Notes (Signed)
CSN: [id number]     Arrival date & time 01/10/15  2111 History   First MD Initiated Contact with Patient 01/10/15 2124     Chief Complaint  Patient presents with  . Loss of Consciousness     (Consider location/radiation/quality/duration/timing/severity/associated sxs/prior Treatment) Patient is a 59 y.o. female presenting with syncope. The history is provided by the patient. No language interpreter was used.  Loss of Consciousness Episode history:  Single Most recent episode:  Today Duration:  1 minute Associated symptoms: chest pain, diaphoresis, dizziness, nausea and shortness of breath   Associated symptoms: no fever and no vomiting   Associated symptoms comment:  Here after syncopal episode earlier this afternoon. She had a normal day prior to syncope, was lying in bed reading for 2 hours prior to getting up and walking down the hall where she became dizzy and passed out. No injury in fall to the floor. She was out for about one minute and felt weak and dizzy when she woke, which persists. She was ambulated by EMS and became sweaty and near syncopal twice before reaching the stretcher for transport. She complains of mild SOB, nausea without vomiting and has pain under her left breast. She reports a history of similar symptoms "years ago" when diagnosed with atrial fibrillation. She denies anticoagulation and states she has been controlled with metoprolol since then. She also states she has significant chronic "stomach" problems and has eaten very little in the last 4-5 days. No fever. She continues to take her regular medications, including morphine and hydrocodone. No recent changes in medication dosing.    Past Medical History  Diagnosis Date  . Diabetes mellitus   . Hypothyroid   . Degenerative disk disease   . Stomach ulcer   . Diverticulitis    History reviewed. No pertinent past surgical history. History reviewed. No pertinent family history. History  Substance Use Topics  .  Smoking status: Not on file  . Smokeless tobacco: Not on file  . Alcohol Use: Not on file   OB History    No data available     Review of Systems  Constitutional: Positive for diaphoresis. Negative for fever and chills.  HENT: Negative.   Eyes: Negative for visual disturbance.  Respiratory: Positive for shortness of breath.   Cardiovascular: Positive for chest pain and syncope.  Gastrointestinal: Positive for nausea and abdominal pain. Negative for vomiting.  Musculoskeletal: Negative.  Negative for neck stiffness.  Skin: Positive for pallor.  Neurological: Positive for dizziness and syncope.      Allergies  Codeine sulfate; Erythromycin base; Metformin; Pioglitazone hcl-metformin hcl; Rosiglitazone maleate; Sitagliptin-metformin hcl; and Tetanus-diphtheria toxoids td  Home Medications   Prior to Admission medications   Medication Sig Start Date End Date Taking? Authorizing Provider  cyclobenzaprine (FLEXERIL) 10 MG tablet Take 10 mg by mouth 3 (three) times daily as needed for muscle spasms.    Yes Historical Provider, MD  HYDROcodone-acetaminophen (NORCO/VICODIN) 5-325 MG per tablet Take 1 tablet by mouth every 6 (six) hours as needed for moderate pain.   Yes Historical Provider, MD  levothyroxine (SYNTHROID, LEVOTHROID) 125 MCG tablet Take 125 mcg by mouth daily.     Yes Historical Provider, MD  metoprolol (TOPROL-XL) 50 MG 24 hr tablet Take 75 mg by mouth daily.    Yes Historical Provider, MD  morphine (MS CONTIN) 100 MG 12 hr tablet Take 100 mg by mouth every 12 (twelve) hours.   Yes Historical Provider, MD  hydrocodone-acetaminophen (LORCET-HD) 5-500 MG per  capsule Take 1 capsule by mouth every 6 (six) hours as needed.      Historical Provider, MD  JANUVIA 100 MG tablet Take 100 mg by mouth daily. 12/15/14   Historical Provider, MD  morphine (KADIAN) 60 MG 24 hr capsule Take 60 mg by mouth 2 (two) times daily.      Historical Provider, MD  omeprazole (PRILOSEC) 20 MG capsule  Take 20 mg by mouth daily. 10/08/14   Historical Provider, MD  pantoprazole (PROTONIX) 20 MG tablet Take 20 mg by mouth daily. 11/10/14   Historical Provider, MD  tiZANidine (ZANAFLEX) 4 MG tablet Take 4 mg by mouth daily as needed. 12/14/14   Historical Provider, MD  zolpidem (AMBIEN) 5 MG tablet Take 5 mg by mouth daily as needed. 12/14/14   Historical Provider, MD   Pulse 57  Resp 14  SpO2 100% Physical Exam  Constitutional: She is oriented to person, place, and time. She appears well-developed and well-nourished.  HENT:  Head: Normocephalic and atraumatic.  Neck: Normal range of motion. Neck supple.  Cardiovascular: Normal rate, regular rhythm and intact distal pulses.   No carotid bruits.  Pulmonary/Chest: Effort normal and breath sounds normal. She has no wheezes. She has no rales.  Abdominal: Soft. Bowel sounds are normal. There is no tenderness. There is no rebound and no guarding.  Musculoskeletal: Normal range of motion. She exhibits no edema.  Neurological: She is alert and oriented to person, place, and time.  Left grip and lower extremity dorsi- and plantar flexion 4/5 when compared to full and normal strength on right. No cranial nerve deficit. Speech clear, coherent and focused. No deficits of coordination.  Skin: Skin is warm and dry. No rash noted.  Psychiatric: She has a normal mood and affect.    ED Course  Procedures (including critical care time) Labs Review Labs Reviewed  CBC WITH DIFFERENTIAL/PLATELET  COMPREHENSIVE METABOLIC PANEL  URINALYSIS, ROUTINE W REFLEX MICROSCOPIC  TROPONIN I  BRAIN NATRIURETIC PEPTIDE  LIPASE, BLOOD   Results for orders placed or performed during the hospital encounter of 01/10/15  CBC with Differential  Result Value Ref Range   WBC 3.1 (L) 4.0 - 10.5 K/uL   RBC 3.67 (L) 3.87 - 5.11 MIL/uL   Hemoglobin 11.5 (L) 12.0 - 15.0 g/dL   HCT 34.5 (L) 36.0 - 46.0 %   MCV 94.0 78.0 - 100.0 fL   MCH 31.3 26.0 - 34.0 pg   MCHC 33.3 30.0 -  36.0 g/dL   RDW 13.8 11.5 - 15.5 %   Platelets 86 (L) 150 - 400 K/uL   Neutrophils Relative % 58 43 - 77 %   Neutro Abs 1.8 1.7 - 7.7 K/uL   Lymphocytes Relative 32 12 - 46 %   Lymphs Abs 1.0 0.7 - 4.0 K/uL   Monocytes Relative 8 3 - 12 %   Monocytes Absolute 0.3 0.1 - 1.0 K/uL   Eosinophils Relative 2 0 - 5 %   Eosinophils Absolute 0.1 0.0 - 0.7 K/uL   Basophils Relative 0 0 - 1 %   Basophils Absolute 0.0 0.0 - 0.1 K/uL  Comprehensive metabolic panel  Result Value Ref Range   Sodium 136 135 - 145 mmol/L   Potassium 3.9 3.5 - 5.1 mmol/L   Chloride 105 101 - 111 mmol/L   CO2 26 22 - 32 mmol/L   Glucose, Bld 208 (H) 70 - 99 mg/dL   BUN 16 6 - 20 mg/dL   Creatinine, Ser 0.83 0.44 -  1.00 mg/dL   Calcium 8.3 (L) 8.9 - 10.3 mg/dL   Total Protein 5.6 (L) 6.5 - 8.1 g/dL   Albumin 2.8 (L) 3.5 - 5.0 g/dL   AST 27 15 - 41 U/L   ALT 22 14 - 54 U/L   Alkaline Phosphatase 84 38 - 126 U/L   Total Bilirubin 0.9 0.3 - 1.2 mg/dL   GFR calc non Af Amer >60 >60 mL/min   GFR calc Af Amer >60 >60 mL/min   Anion gap 5 5 - 15  Urinalysis, Routine w reflex microscopic  Result Value Ref Range   Color, Urine AMBER (A) YELLOW   APPearance CLOUDY (A) CLEAR   Specific Gravity, Urine 1.018 1.005 - 1.030   pH 7.0 5.0 - 8.0   Glucose, UA NEGATIVE NEGATIVE mg/dL   Hgb urine dipstick SMALL (A) NEGATIVE   Bilirubin Urine NEGATIVE NEGATIVE   Ketones, ur NEGATIVE NEGATIVE mg/dL   Protein, ur 30 (A) NEGATIVE mg/dL   Urobilinogen, UA 1.0 0.0 - 1.0 mg/dL   Nitrite NEGATIVE NEGATIVE   Leukocytes, UA NEGATIVE NEGATIVE  Troponin I  Result Value Ref Range   Troponin I <0.03 <0.031 ng/mL  Brain natriuretic peptide  Result Value Ref Range   B Natriuretic Peptide 62.5 0.0 - 100.0 pg/mL  Lipase, blood  Result Value Ref Range   Lipase 23 22 - 51 U/L  Urine microscopic-add on  Result Value Ref Range   Squamous Epithelial / LPF MANY (A) RARE   WBC, UA 0-2 <3 WBC/hpf   RBC / HPF 7-10 <3 RBC/hpf   Bacteria, UA  MANY (A) RARE   Casts HYALINE CASTS (A) NEGATIVE   Dg Chest 2 View  01/10/2015   CLINICAL DATA:  Syncope and lethargy, 1 day duration  EXAM: CHEST  2 VIEW  COMPARISON:  01/14/2012  FINDINGS: The heart size and mediastinal contours are within normal limits. Both lungs are clear. The visualized skeletal structures are unremarkable.  IMPRESSION: No active cardiopulmonary disease.   Electronically Signed   By: Andreas Newport M.D.   On: 01/10/2015 22:16   Ct Head Wo Contrast  01/11/2015   CLINICAL DATA:  Syncopal episode new  EXAM: CT HEAD WITHOUT CONTRAST  TECHNIQUE: Contiguous axial images were obtained from the base of the skull through the vertex without intravenous contrast.  COMPARISON:  Head CT 11/02/2007  FINDINGS: No acute intracranial hemorrhage. No focal mass lesion. No CT evidence of acute infarction. No midline shift or mass effect. No hydrocephalus. Basilar cisterns are patent. Paranasal sinuses and mastoid air cells are clear.  IMPRESSION: No acute intracranial findings.  No change from prior   Electronically Signed   By: Suzy Bouchard M.D.   On: 01/11/2015 00:33     Imaging Review No results found.   EKG Interpretation None      MDM   Final diagnoses:  None    1. Syncope  She is significantly orthostatic in the emergency room. She continues to feel weak. IV fluids bolused with slow change.  She returns from radiology stating she was accidentally hit with the equipment in posterior left shoulder. She has a FROM, no bony deformity, no wound or significant discoloration. She reports the pain is improving over time. No suspicion for significant injury.  EKG unchanged from previous. No chest pain typical for cardiac source, negative troponin. Doubt cardiogenic syncope. Suspect poor PO intake leading to dehydration. Suspect her opiate medications may be contributory.   Discussed with the hospitalist for admission.  Charlann Lange, PA-C 01/11/15 [place],  MD 01/11/15 [phone]

## 2015-01-10 NOTE — ED Notes (Signed)
Pt. Unable to void at the moment. Pt. Also complains of her back hurting now from the edge of the slider board hitting her in the back while she was at x-ray. Asking nurse to examine. Nurse notified.

## 2015-01-10 NOTE — ED Notes (Signed)
PT had syncope episode at home: EMS on scene; conscious but groggy; systolic 62'B, pale, diaphoretic; HR 50s. SB. Pt reports this happened 6-7 years ago; dx with afib and given beta blocker. Pt has not eaten in 5 days; intact poor. Denies chills; diaphoretic at times.

## 2015-01-10 NOTE — ED Notes (Signed)
Pt. Complained of dizziness while standing and a headache coming on now. Dr. Ree Kida.

## 2015-01-11 ENCOUNTER — Encounter (HOSPITAL_COMMUNITY): Payer: Self-pay

## 2015-01-11 ENCOUNTER — Emergency Department (HOSPITAL_COMMUNITY): Payer: Medicare HMO

## 2015-01-11 DIAGNOSIS — E119 Type 2 diabetes mellitus without complications: Secondary | ICD-10-CM | POA: Diagnosis not present

## 2015-01-11 DIAGNOSIS — I951 Orthostatic hypotension: Secondary | ICD-10-CM | POA: Diagnosis not present

## 2015-01-11 DIAGNOSIS — R55 Syncope and collapse: Secondary | ICD-10-CM | POA: Diagnosis not present

## 2015-01-11 LAB — GLUCOSE, CAPILLARY
GLUCOSE-CAPILLARY: 193 mg/dL — AB (ref 70–99)
Glucose-Capillary: 153 mg/dL — ABNORMAL HIGH (ref 70–99)
Glucose-Capillary: 159 mg/dL — ABNORMAL HIGH (ref 70–99)

## 2015-01-11 LAB — CBC WITH DIFFERENTIAL/PLATELET
BASOS PCT: 0 % (ref 0–1)
Basophils Absolute: 0 10*3/uL (ref 0.0–0.1)
EOS PCT: 2 % (ref 0–5)
Eosinophils Absolute: 0.1 10*3/uL (ref 0.0–0.7)
HCT: 34.5 % — ABNORMAL LOW (ref 36.0–46.0)
Hemoglobin: 11.5 g/dL — ABNORMAL LOW (ref 12.0–15.0)
LYMPHS ABS: 1 10*3/uL (ref 0.7–4.0)
LYMPHS PCT: 32 % (ref 12–46)
MCH: 31.3 pg (ref 26.0–34.0)
MCHC: 33.3 g/dL (ref 30.0–36.0)
MCV: 94 fL (ref 78.0–100.0)
Monocytes Absolute: 0.3 10*3/uL (ref 0.1–1.0)
Monocytes Relative: 8 % (ref 3–12)
NEUTROS ABS: 1.8 10*3/uL (ref 1.7–7.7)
NEUTROS PCT: 58 % (ref 43–77)
PLATELETS: 86 10*3/uL — AB (ref 150–400)
RBC: 3.67 MIL/uL — AB (ref 3.87–5.11)
RDW: 13.8 % (ref 11.5–15.5)
WBC: 3.1 10*3/uL — ABNORMAL LOW (ref 4.0–10.5)

## 2015-01-11 LAB — BRAIN NATRIURETIC PEPTIDE: B Natriuretic Peptide: 62.5 pg/mL (ref 0.0–100.0)

## 2015-01-11 MED ORDER — METOPROLOL SUCCINATE ER 50 MG PO TB24
50.0000 mg | ORAL_TABLET | Freq: Every day | ORAL | Status: DC
  Administered 2015-01-11: 50 mg via ORAL
  Filled 2015-01-11: qty 1

## 2015-01-11 MED ORDER — TIZANIDINE HCL 4 MG PO TABS
4.0000 mg | ORAL_TABLET | Freq: Every day | ORAL | Status: DC | PRN
  Administered 2015-01-11: 4 mg via ORAL
  Filled 2015-01-11 (×5): qty 1

## 2015-01-11 MED ORDER — ONDANSETRON HCL 4 MG/2ML IJ SOLN
4.0000 mg | Freq: Once | INTRAMUSCULAR | Status: AC
  Administered 2015-01-11: 4 mg via INTRAVENOUS
  Filled 2015-01-11: qty 2

## 2015-01-11 MED ORDER — METOCLOPRAMIDE HCL 5 MG/ML IJ SOLN
10.0000 mg | Freq: Four times a day (QID) | INTRAMUSCULAR | Status: DC | PRN
  Administered 2015-01-11: 10 mg via INTRAVENOUS
  Filled 2015-01-11 (×2): qty 2

## 2015-01-11 MED ORDER — PANTOPRAZOLE SODIUM 40 MG PO TBEC
40.0000 mg | DELAYED_RELEASE_TABLET | Freq: Every day | ORAL | Status: DC
  Administered 2015-01-11 – 2015-01-12 (×2): 40 mg via ORAL
  Filled 2015-01-11 (×2): qty 1

## 2015-01-11 MED ORDER — MORPHINE SULFATE ER 100 MG PO TBCR
100.0000 mg | EXTENDED_RELEASE_TABLET | Freq: Two times a day (BID) | ORAL | Status: DC
  Administered 2015-01-11 – 2015-01-12 (×3): 100 mg via ORAL
  Filled 2015-01-11 (×3): qty 1

## 2015-01-11 MED ORDER — ZOLPIDEM TARTRATE 5 MG PO TABS
5.0000 mg | ORAL_TABLET | Freq: Every day | ORAL | Status: DC | PRN
  Administered 2015-01-11: 5 mg via ORAL
  Filled 2015-01-11: qty 1

## 2015-01-11 MED ORDER — METOPROLOL SUCCINATE ER 25 MG PO TB24
25.0000 mg | ORAL_TABLET | Freq: Every day | ORAL | Status: DC
  Filled 2015-01-11: qty 1

## 2015-01-11 MED ORDER — SODIUM CHLORIDE 0.9 % IV SOLN
INTRAVENOUS | Status: DC
  Administered 2015-01-11 – 2015-01-12 (×2): via INTRAVENOUS

## 2015-01-11 MED ORDER — LEVOTHYROXINE SODIUM 125 MCG PO TABS
125.0000 ug | ORAL_TABLET | Freq: Every day | ORAL | Status: DC
  Administered 2015-01-11 – 2015-01-12 (×2): 125 ug via ORAL
  Filled 2015-01-11 (×4): qty 1

## 2015-01-11 MED ORDER — METOPROLOL SUCCINATE ER 25 MG PO TB24
25.0000 mg | ORAL_TABLET | Freq: Two times a day (BID) | ORAL | Status: DC
  Filled 2015-01-11 (×2): qty 2

## 2015-01-11 MED ORDER — INSULIN ASPART 100 UNIT/ML ~~LOC~~ SOLN: 0.0000 [IU] | Freq: Every day | SUBCUTANEOUS | Status: DC

## 2015-01-11 MED ORDER — ENOXAPARIN SODIUM 40 MG/0.4ML ~~LOC~~ SOLN
40.0000 mg | SUBCUTANEOUS | Status: DC
  Administered 2015-01-11: 40 mg via SUBCUTANEOUS
  Filled 2015-01-11 (×2): qty 0.4

## 2015-01-11 MED ORDER — LINAGLIPTIN 5 MG PO TABS
5.0000 mg | ORAL_TABLET | Freq: Every day | ORAL | Status: DC
  Administered 2015-01-11 – 2015-01-12 (×2): 5 mg via ORAL
  Filled 2015-01-11 (×3): qty 1

## 2015-01-11 MED ORDER — HYDROCODONE-ACETAMINOPHEN 5-325 MG PO TABS
1.0000 | ORAL_TABLET | Freq: Four times a day (QID) | ORAL | Status: DC | PRN
  Administered 2015-01-11 – 2015-01-12 (×4): 1 via ORAL
  Filled 2015-01-11 (×4): qty 1

## 2015-01-11 MED ORDER — PANTOPRAZOLE SODIUM 20 MG PO TBEC
20.0000 mg | DELAYED_RELEASE_TABLET | Freq: Every day | ORAL | Status: DC
  Filled 2015-01-11: qty 1

## 2015-01-11 MED ORDER — MORPHINE SULFATE 4 MG/ML IJ SOLN
4.0000 mg | Freq: Once | INTRAMUSCULAR | Status: AC
  Administered 2015-01-11: 4 mg via INTRAVENOUS
  Filled 2015-01-11: qty 1

## 2015-01-11 MED ORDER — INSULIN ASPART 100 UNIT/ML ~~LOC~~ SOLN
0.0000 [IU] | Freq: Three times a day (TID) | SUBCUTANEOUS | Status: DC
  Administered 2015-01-11: 2 [IU] via SUBCUTANEOUS
  Administered 2015-01-12: 3 [IU] via SUBCUTANEOUS

## 2015-01-11 NOTE — ED Notes (Signed)
Pt. Is requesting meds for pain.

## 2015-01-11 NOTE — Progress Notes (Signed)
Inpatient Diabetes Program Recommendations  AACE/ADA: New Consensus Statement on Inpatient Glycemic Control (2013)  Target Ranges:  Prepandial:   less than 140 mg/dL      Peak postprandial:   less than 180 mg/dL (1-2 hours)      Critically ill patients:  140 - 180 mg/dL   Reason for Visit: Hyperglycemia  Diabetes history: DM2 Outpatient Diabetes medications: Januvia 100 mg QD Current orders for Inpatient glycemic control: Tradjenta 5 mg QD  Results for NAZLY, DIGILIO (MRN [id number]) as of 01/11/2015 10:54  Ref. Range 01/10/2015 23:00  Glucose Latest Ref Range: 70-99 mg/dL 208 (H)  Results for IZZIE, GEERS (MRN [id number]) as of 01/11/2015 10:54  Ref. Range 10/08/2014 00:48  Hemoglobin A1C Latest Ref Range: 4.2-6.3 % 9.0 (H)   Needs correction insulin.  Recommendations: Add Novolog moderate tidwc and hs Check HgbA1C to assess glycemic control prior to hospitalization  Will continue to follow. Thank you. Lorenda Peck, RD, LDN, CDE Inpatient Diabetes Coordinator [phone]

## 2015-01-11 NOTE — Progress Notes (Signed)
UR completed 

## 2015-01-11 NOTE — Progress Notes (Signed)
Patient admitted after midnight. Chart reviewed. Patient examined. She is asking for IV Dilaudid even though she is eating well and on 100 mg of MS Contin twice a day. According to chart review, has a history of chronic thrombocytopenia, gastroparesis. Denies having run out of her medications. I got her up and she was able to stand but felt dizzy. Will repeat orthostatics, continue IV fluids and get PT evaluation. Avoid increasing narcotics. No reports of vomiting here.  Doree Barthel, MD [facility]

## 2015-01-11 NOTE — H&P (Signed)
[facility] History and Physical  [name] SHALL EXN:[id number] DOB: 11-12-1955 DOA: 01/10/2015  Referring physician: EDP PCP: Alvester Chou, NP   Chief Complaint: Syncope   HPI: [name] is a 59 y.o. female who presents to the ED after a syncopal episode that occurred at home.  She had been lying in bed reading for 2 hours prior to getting up and walking.  She ambulated down the hall, became dizzy shortly after getting up, and passed out.  Duration of unconsiousness was about 1 min.  She called EMS.  EMS came, ambulated the patient, and she had 2 near syncopal episodes before reaching the stretcher for transport.  She has positive orthostatic vitals in the ED with SBP dropping from 120s when lying down, down to 70s upon standing (at which point she becomes lightheaded and symptomatic).  Her symptoms are improved by sitting or lying back down.  Patient reports very poor PO intake for past 4 days or so due to significant chronic "stomach" problems (h/o nissen, has known active dysphagia which [facility] is trying to work up with esophageal manometry study on 05/13/14 that the patient no-showed for, also missed her hospital follow up appointment with general surgery on 05/31/14 as well, takes PO opiods for chronic abdominal pain).  Review of Systems: Systems reviewed.  As above, otherwise negative  Past Medical History  Diagnosis Date  . Diabetes mellitus   . Hypothyroid   . Degenerative disk disease   . Stomach ulcer   . Diverticulitis    History reviewed. No pertinent past surgical history. Social History:  has no tobacco, alcohol, and drug history on file.  Allergies  Allergen Reactions  . Codeine Sulfate   . Erythromycin Base   . Metformin     REACTION: Loose stools  . Pioglitazone Hcl-Metformin Hcl     REACTION: Diarrhea  . Rosiglitazone Maleate     REACTION: Swelling  . Sitagliptin-Metformin Hcl     REACTION: Rash  . Tetanus-Diphtheria Toxoids Td     History reviewed.  No pertinent family history.   Prior to Admission medications   Medication Sig Start Date End Date Taking? Authorizing Provider  HYDROcodone-acetaminophen (NORCO/VICODIN) 5-325 MG per tablet Take 1 tablet by mouth every 6 (six) hours as needed for moderate pain.   Yes Historical Provider, MD  JANUVIA 100 MG tablet Take 100 mg by mouth daily. 12/15/14  Yes Historical Provider, MD  levothyroxine (SYNTHROID, LEVOTHROID) 125 MCG tablet Take 125 mcg by mouth daily.     Yes Historical Provider, MD  metoprolol (TOPROL-XL) 50 MG 24 hr tablet Take 25-50 mg by mouth 2 (two) times daily. Take 50 mg in the morning and 25 mg in the evening   Yes Historical Provider, MD  morphine (MS CONTIN) 100 MG 12 hr tablet Take 100 mg by mouth every 12 (twelve) hours.   Yes Historical Provider, MD  omeprazole (PRILOSEC) 20 MG capsule Take 20 mg by mouth daily. 10/08/14  Yes Historical Provider, MD  pantoprazole (PROTONIX) 20 MG tablet Take 20 mg by mouth daily. 11/10/14  Yes Historical Provider, MD  tiZANidine (ZANAFLEX) 4 MG tablet Take 4 mg by mouth daily as needed for muscle spasms.  12/14/14  Yes Historical Provider, MD  zolpidem (AMBIEN) 5 MG tablet Take 5 mg by mouth daily as needed for sleep.  12/14/14  Yes Historical Provider, MD   Physical Exam: Filed Vitals:   01/11/15 0200  BP: 118/54  Pulse: 53  Resp: 12  BP 118/54 mmHg  Pulse 53  Resp 12  SpO2 96%  General Appearance:    Alert, oriented, no distress, appears stated age  Head:    Normocephalic, atraumatic  Eyes:    PERRL, EOMI, sclera non-icteric        Nose:   Nares without drainage or epistaxis. Mucosa, turbinates normal  Throat:   Moist mucous membranes. Oropharynx without erythema or exudate.  Neck:   Supple. No carotid bruits.  No thyromegaly.  No lymphadenopathy.   Back:     No CVA tenderness, no spinal tenderness  Lungs:     Clear to auscultation bilaterally, without wheezes, rhonchi or rales  Chest wall:    No tenderness to palpitation   Heart:    Regular rate and rhythm without murmurs, gallops, rubs  Abdomen:     Soft, non-tender, nondistended, normal bowel sounds, no organomegaly  Genitalia:    deferred  Rectal:    deferred  Extremities:   No clubbing, cyanosis or edema.  Pulses:   2+ and symmetric all extremities  Skin:   Skin color, texture, turgor normal, no rashes or lesions  Lymph nodes:   Cervical, supraclavicular, and axillary nodes normal  Neurologic:   CNII-XII intact. Normal strength, sensation and reflexes      throughout    Labs on Admission:  Basic Metabolic Panel:  Recent Labs Lab 01/10/15 2300  NA 136  K 3.9  CL 105  CO2 26  GLUCOSE 208*  BUN 16  CREATININE 0.83  CALCIUM 8.3*   Liver Function Tests:  Recent Labs Lab 01/10/15 2300  AST 27  ALT 22  ALKPHOS 84  BILITOT 0.9  PROT 5.6*  ALBUMIN 2.8*    Recent Labs Lab 01/10/15 2300  LIPASE 23   No results for input(s): AMMONIA in the last 168 hours. CBC:  Recent Labs Lab 01/10/15 2300  WBC 3.1*  NEUTROABS 1.8  HGB 11.5*  HCT 34.5*  MCV 94.0  PLT 86*   Cardiac Enzymes:  Recent Labs Lab 01/10/15 2300  TROPONINI <0.03    BNP (last 3 results) No results for input(s): PROBNP in the last 8760 hours. CBG: No results for input(s): GLUCAP in the last 168 hours.  Radiological Exams on Admission: Dg Chest 2 View  01/10/2015   CLINICAL DATA:  Syncope and lethargy, 1 day duration  EXAM: CHEST  2 VIEW  COMPARISON:  01/14/2012  FINDINGS: The heart size and mediastinal contours are within normal limits. Both lungs are clear. The visualized skeletal structures are unremarkable.  IMPRESSION: No active cardiopulmonary disease.   Electronically Signed   By: Andreas Newport M.D.   On: 01/10/2015 22:16   Ct Head Wo Contrast  01/11/2015   CLINICAL DATA:  Syncopal episode new  EXAM: CT HEAD WITHOUT CONTRAST  TECHNIQUE: Contiguous axial images were obtained from the base of the skull through the vertex without intravenous contrast.   COMPARISON:  Head CT 11/02/2007  FINDINGS: No acute intracranial hemorrhage. No focal mass lesion. No CT evidence of acute infarction. No midline shift or mass effect. No hydrocephalus. Basilar cisterns are patent. Paranasal sinuses and mastoid air cells are clear.  IMPRESSION: No acute intracranial findings.  No change from prior   Electronically Signed   By: Suzy Bouchard M.D.   On: 01/11/2015 00:33    EKG: Independently reviewed.  Assessment/Plan Principal Problem:   Orthostatic syncope Active Problems:   DM2 (diabetes mellitus, type 2)   1. Orthostatic syncope - most likely due to  dehydration / poor PO intake 1. Hydrating with NS 2. DM2 - continue home meds 3. Chronic pain - continue home meds    Code Status: Full  Family Communication: No family in room Disposition Plan: Admit to obs   Time spent: 50 min  Inez Rosato M. [facility] Pager [phone]  If 7AM-7PM, please contact the day team taking care of the patient [email or web address] Password [email or web address] 01/11/2015, 2:40 AM

## 2015-01-11 NOTE — Progress Notes (Signed)
Late entry:  Arrived to unit from ED via stretcher admitted to room 5w33.  Alert and oriented; able to make needs known.  Instructed on use of call bell system and placed within reach.  Hydration provided.  Voices no complaints at this time; in otherwise stable condition at this time.  Bed in low position; will continue to monitor.

## 2015-01-12 DIAGNOSIS — Z79891 Long term (current) use of opiate analgesic: Secondary | ICD-10-CM

## 2015-01-12 DIAGNOSIS — I951 Orthostatic hypotension: Secondary | ICD-10-CM | POA: Diagnosis not present

## 2015-01-12 DIAGNOSIS — G894 Chronic pain syndrome: Secondary | ICD-10-CM

## 2015-01-12 DIAGNOSIS — I10 Essential (primary) hypertension: Secondary | ICD-10-CM | POA: Diagnosis present

## 2015-01-12 DIAGNOSIS — R55 Syncope and collapse: Secondary | ICD-10-CM | POA: Diagnosis not present

## 2015-01-12 DIAGNOSIS — K3184 Gastroparesis: Secondary | ICD-10-CM | POA: Diagnosis present

## 2015-01-12 DIAGNOSIS — R112 Nausea with vomiting, unspecified: Secondary | ICD-10-CM | POA: Diagnosis present

## 2015-01-12 DIAGNOSIS — E860 Dehydration: Secondary | ICD-10-CM | POA: Diagnosis present

## 2015-01-12 LAB — BASIC METABOLIC PANEL
Anion gap: 5 (ref 5–15)
BUN: 11 mg/dL (ref 6–20)
CALCIUM: 8.1 mg/dL — AB (ref 8.9–10.3)
CO2: 25 mmol/L (ref 22–32)
Chloride: 107 mmol/L (ref 101–111)
Creatinine, Ser: 0.73 mg/dL (ref 0.44–1.00)
GFR calc Af Amer: >60 mL/min (ref >60)
GFR calc non Af Amer: >60 mL/min (ref >60)
Glucose, Bld: 221 mg/dL — ABNORMAL HIGH (ref 70–99)
Potassium: 3.8 mmol/L (ref 3.5–5.1)
Sodium: 137 mmol/L (ref 135–145)

## 2015-01-12 LAB — CBC
HEMATOCRIT: 33.9 % — AB (ref 36.0–46.0)
HEMOGLOBIN: 11.1 g/dL — AB (ref 12.0–15.0)
MCH: 31.4 pg (ref 26.0–34.0)
MCHC: 32.7 g/dL (ref 30.0–36.0)
MCV: 95.8 fL (ref 78.0–100.0)
Platelets: 76 10*3/uL — ABNORMAL LOW (ref 150–400)
RBC: 3.54 MIL/uL — ABNORMAL LOW (ref 3.87–5.11)
RDW: 13.9 % (ref 11.5–15.5)
WBC: 2.1 10*3/uL — AB (ref 4.0–10.5)

## 2015-01-12 LAB — TSH: TSH: 4.46 u[IU]/mL (ref 0.350–4.500)

## 2015-01-12 LAB — GLUCOSE, CAPILLARY: Glucose-Capillary: 203 mg/dL — ABNORMAL HIGH (ref 70–99)

## 2015-01-12 NOTE — Evaluation (Signed)
Physical Therapy Evaluation Patient Details Name: [name] MRN: [id number] DOB: 08/14/1956 Today's Date: 01/12/2015   History of Present Illness  Pt is a 59 y.o. female who presents to the ED after a syncopal episode that occurred at home. She had been lying in bed reading for 2 hours prior to getting up and walking.She became dizzy shortly after getting up, and passed out.Duration of unconsiousness was about 1 min. EMS came, ambulated the patient, and she had 2 near syncopal episodes before reaching the stretcher for transport.She has positive orthostatic vitals in the ED with SBP dropping from 120s when lying down, down to 70s upon standing (at which point she becomes lightheaded and symptomatic). Her symptoms are improved by sitting or lying back down.  Clinical Impression  Pt admitted with above diagnosis. Pt currently with functional limitations due to the deficits listed below (see PT Problem List). At the time of PT eval pt was able to perform transfers and ambulation with mod I to supervision for safety. Recommended that pt continue to use the RW instead of the [facility] at this time until returns to PLOF. Recommending HHPT to follow up at d/c. Pt will benefit from skilled PT to increase their independence and safety with mobility to allow discharge to the venue listed below.       Follow Up Recommendations Home health PT;Supervision for mobility/OOB    Equipment Recommendations  None recommended by PT    Recommendations for Other Services       Precautions / Restrictions Precautions Precautions: Fall Restrictions Weight Bearing Restrictions: No      Mobility  Bed Mobility               General bed mobility comments: Pt up walking around the room when PT arrived.  Transfers Overall transfer level: Modified independent Equipment used: Rolling walker (2 wheeled) Transfers: Sit to/from Stand              Ambulation/Gait Ambulation/Gait assistance:  Supervision Ambulation Distance (Feet): 125 Feet Assistive device: Rolling walker (2 wheeled) Gait Pattern/deviations: Step-through pattern;Decreased stride length;Trunk flexed Gait velocity: Decreased Gait velocity interpretation: Below normal speed for age/gender General Gait Details: Supervision for safety. Pt fatigued at end of session but no dizziness or SOB noted.   Stairs            Wheelchair Mobility    Modified Rankin (Stroke Patients Only)       Balance Overall balance assessment: No apparent balance deficits (not formally assessed)                                           Pertinent Vitals/Pain Pain Assessment: 0-10 Pain Score: 8  Pain Location: Low back Pain Descriptors / Indicators: Aching Pain Intervention(s): Limited activity within patient's tolerance;Monitored during session;Repositioned    Home Living Family/patient expects to be discharged to:: Private residence Living Arrangements: Spouse/significant other;Children Available Help at Discharge: Family;Available 24 hours/day Type of Home: Mobile home Home Access: Ramped entrance     Home Layout: One level Home Equipment: [place] - 2 wheels;Cane - single point;Shower seat      Prior Function Level of Independence: Independent with assistive device(s)         Comments: Pt still drives occasionally. Does grocery shopping with husband and holds onto the cart - occasionally uses the motorized cart. SPC when out of the house  Hand Dominance   Dominant Hand: Right    Extremity/Trunk Assessment   Upper Extremity Assessment: Defer to OT evaluation           Lower Extremity Assessment: Generalized weakness      Cervical / Trunk Assessment: Normal  Communication   Communication: No difficulties  Cognition Arousal/Alertness: Awake/alert Behavior During Therapy: WFL for tasks assessed/performed Overall Cognitive Status: Within Functional Limits for tasks  assessed                      General Comments      Exercises        Assessment/Plan    PT Assessment Patient needs continued PT services  PT Diagnosis Difficulty walking;Generalized weakness   PT Problem List Decreased strength;Decreased range of motion;Decreased activity tolerance;Decreased balance;Decreased mobility;Decreased knowledge of use of DME;Decreased safety awareness;Decreased knowledge of precautions  PT Treatment Interventions DME instruction;Gait training;Stair training;Functional mobility training;Therapeutic activities;Therapeutic exercise;Neuromuscular re-education;Patient/family education   PT Goals (Current goals can be found in the Care Plan section) Acute Rehab PT Goals Patient Stated Goal: Home today PT Goal Formulation: With patient Time For Goal Achievement: 01/19/15 Potential to Achieve Goals: Good    Frequency Min 3X/week   Barriers to discharge        Co-evaluation               End of Session Equipment Utilized During Treatment: Gait belt Activity Tolerance: Patient tolerated treatment well Patient left: with call bell/phone within reach;Other (comment) (Sitting EOB) Nurse Communication: Mobility status         Time: 9791-5041 PT Time Calculation (min) (ACUTE ONLY): 27 min   Charges:   PT Evaluation $Initial PT Evaluation Tier I: 1 Procedure PT Treatments $Gait Training: 8-22 mins   PT G Codes:        Rolinda Roan 14-Jan-2015, 12:10 PM   Rolinda Roan, PT, DPT Acute Rehabilitation Services Pager: [phone]

## 2015-01-12 NOTE — Discharge Summary (Signed)
Physician Discharge Summary  [name] SEG:[id number] DOB: 09-26-1955 DOA: 01/10/2015  PCP: Alvester Chou, NP  Admit date: 01/10/2015 Discharge date: 01/12/2015  Recommendations for Outpatient Follow-up:  1. Metoprolol has been held. Please monitor blood pressure and may resume once no longer at risk for orthostatic hypotension.  Discharge Diagnoses:  Principal Problem:   Orthostatic syncope Active Problems:   DM2 (diabetes mellitus, type 2)   Chronic pain syndrome   Nausea with vomiting   Dehydration   Essential hypertension   Orthostatic hypotension   Chronically on opiate therapy   Gastroparesis   Discharge Condition: Stable  Diet recommendation: Diabetic heart healthy  Filed Weights   01/11/15 0402  Weight: 66.4 kg (146 lb 6.2 oz)    History of present illness:  59 y.o. female who presents to the ED after a syncopal episode that occurred at home. She had been lying in bed reading for 2 hours prior to getting up and walking. She ambulated down the hall, became dizzy shortly after getting up, and passed out. Duration of unconsiousness was about 1 min. She called EMS. EMS came, ambulated the patient, and she had 2 near syncopal episodes before reaching the stretcher for transport. She has positive orthostatic vitals in the ED with SBP dropping from 120s when lying down, down to 70s upon standing (at which point she becomes lightheaded and symptomatic). Her symptoms are improved by sitting or lying back down.  Blood pressure in the emergency room was as low as 74/50.  Patient reports very poor PO intake for past 4 days or so due to significant chronic "stomach" problems (h/o nissen, has known active dysphagia which [facility] is trying to work up with esophageal manometry study on 05/13/14 that the patient no-showed for, also missed her hospital follow up appointment with general surgery on 05/31/14 as well, takes PO opiods for chronic abdominal pain).  Hospital Course:  Patient was  admitted to [facility]. Metoprolol held. Vigorously hydrated. Antiemetics as needed. Home opiates resumed. Records from [place] reviewed. Has history of gastroparesis.  By discharge, blood pressure stable, though borderline low. However, patient is currently asymptomatic and able to ambulate without difficulty. I recommend she not resume her Toprol until cleared by primary care provider. She is an appointment with Dr. Aris Lot today. She has had no vomiting or diarrhea here. She reports that she has not run out of her opiates and that she has antiemetics at home. She feels great and is requesting discharge today.  Procedures:  None  Consultations:  None  Discharge Exam: Filed Vitals:   01/12/15 0536  BP: 94/42  Pulse: 63  Temp: 97.9 F (36.6 C)  Resp: 16    General: Alert, oriented. Watching TV and talking on the phone. Cardiovascular: Regular rate rhythm without murmurs gallops rubs Respiratory: Clear to auscultation bilaterally without wheezes rhonchi or rales Abdomen soft nontender nondistended Extremities no clubbing cyanosis or edema  Discharge Instructions   Discharge Instructions    Diet - low sodium heart healthy    Complete by:  As directed      Discharge instructions    Complete by:  As directed   Hold Toprol XL until cleared by your doctor     Increase activity slowly    Complete by:  As directed           Current Discharge Medication List    CONTINUE these medications which have NOT CHANGED   Details  HYDROcodone-acetaminophen (NORCO/VICODIN) 5-325 MG per tablet Take 1 tablet by  mouth every 6 (six) hours as needed for moderate pain.    JANUVIA 100 MG tablet Take 100 mg by mouth daily.    levothyroxine (SYNTHROID, LEVOTHROID) 125 MCG tablet Take 125 mcg by mouth daily.      morphine (MS CONTIN) 100 MG 12 hr tablet Take 100 mg by mouth every 12 (twelve) hours.    omeprazole (PRILOSEC) 20 MG capsule Take 20 mg by mouth daily.    pantoprazole (PROTONIX) 20 MG  tablet Take 20 mg by mouth daily.    tiZANidine (ZANAFLEX) 4 MG tablet Take 4 mg by mouth daily as needed for muscle spasms.     zolpidem (AMBIEN) 5 MG tablet Take 5 mg by mouth daily as needed for sleep.       STOP taking these medications     metoprolol (TOPROL-XL) 50 MG 24 hr tablet        Allergies  Allergen Reactions  . Codeine Sulfate   . Erythromycin Base   . Metformin     REACTION: Loose stools  . Pioglitazone Hcl-Metformin Hcl     REACTION: Diarrhea  . Rosiglitazone Maleate     REACTION: Swelling  . Sitagliptin-Metformin Hcl     REACTION: Rash  . Tetanus-Diphtheria Toxoids Td    Follow-up Information    Follow up with Alvester Chou, NP.   Specialty:  Nurse Practitioner   Contact information:   [facility] [place] [address] [phone]        The results of significant diagnostics from this hospitalization (including imaging, microbiology, ancillary and laboratory) are listed below for reference.    Significant Diagnostic Studies: Dg Chest 2 View  01/10/2015   CLINICAL DATA:  Syncope and lethargy, 1 day duration  EXAM: CHEST  2 VIEW  COMPARISON:  01/14/2012  FINDINGS: The heart size and mediastinal contours are within normal limits. Both lungs are clear. The visualized skeletal structures are unremarkable.  IMPRESSION: No active cardiopulmonary disease.   Electronically Signed   By: Andreas Newport M.D.   On: 01/10/2015 22:16   Ct Head Wo Contrast  01/11/2015   CLINICAL DATA:  Syncopal episode new  EXAM: CT HEAD WITHOUT CONTRAST  TECHNIQUE: Contiguous axial images were obtained from the base of the skull through the vertex without intravenous contrast.  COMPARISON:  Head CT 11/02/2007  FINDINGS: No acute intracranial hemorrhage. No focal mass lesion. No CT evidence of acute infarction. No midline shift or mass effect. No hydrocephalus. Basilar cisterns are patent. Paranasal sinuses and mastoid air cells are clear.   IMPRESSION: No acute intracranial findings.  No change from prior   Electronically Signed   By: Suzy Bouchard M.D.   On: 01/11/2015 00:33    Microbiology: No results found for this or any previous visit (from the past 240 hour(s)).   Labs: Basic Metabolic Panel:  Recent Labs Lab 01/10/15 2300 01/12/15 0545  NA 136 137  K 3.9 3.8  CL 105 107  CO2 26 25  GLUCOSE 208* 221*  BUN 16 11  CREATININE 0.83 0.73  CALCIUM 8.3* 8.1*   Liver Function Tests:  Recent Labs Lab 01/10/15 2300  AST 27  ALT 22  ALKPHOS 84  BILITOT 0.9  PROT 5.6*  ALBUMIN 2.8*    Recent Labs Lab 01/10/15 2300  LIPASE 23   No results for input(s): AMMONIA in the last 168 hours. CBC:  Recent Labs Lab 01/10/15 2300 01/12/15 0545  WBC 3.1* 2.1*  NEUTROABS  1.8  --   HGB 11.5* 11.1*  HCT 34.5* 33.9*  MCV 94.0 95.8  PLT 86* 76*   Cardiac Enzymes:  Recent Labs Lab 01/10/15 2300  TROPONINI <0.03   BNP: BNP (last 3 results)  Recent Labs  01/10/15 2300  BNP 62.5    ProBNP (last 3 results) No results for input(s): PROBNP in the last 8760 hours.  CBG:  Recent Labs Lab 01/11/15 1242 01/11/15 1658 01/11/15 2108 01/12/15 0812  GLUCAP 153* 193* 159* 203*   EKG sinus bradycardia Borderline right axis deviation Anteroseptal infarct, old Prolonged QT interval  Signed:  Jefferson L  [facility] 01/12/2015, 10:32 AM

## 2015-01-12 NOTE — Progress Notes (Signed)
Physical Therapy Addendum for G-Codes    2015-01-22 1200  PT G-Codes **NOT FOR INPATIENT CLASS**  Functional Assessment Tool Used Clinical judgement  Functional Limitation Mobility: Walking and moving around  Mobility: Walking and Moving Around Current Status [phone]) CI  Mobility: Walking and Moving Around Goal Status (D3220) CI   Rolinda Roan, PT, DPT Acute Rehabilitation Services Pager: [phone]

## 2015-01-13 LAB — HEMOGLOBIN A1C
Hgb A1c MFr Bld: 6.7 % — ABNORMAL HIGH (ref 4.8–5.6)
Mean Plasma Glucose: 146 mg/dL

## 2015-02-09 DIAGNOSIS — G459 Transient cerebral ischemic attack, unspecified: Secondary | ICD-10-CM

## 2015-02-09 HISTORY — DX: Transient cerebral ischemic attack, unspecified: G45.9

## 2015-02-15 ENCOUNTER — Encounter: Payer: Self-pay | Admitting: Emergency Medicine

## 2015-02-15 ENCOUNTER — Emergency Department: Payer: Medicare HMO

## 2015-02-15 ENCOUNTER — Inpatient Hospital Stay
Admission: EM | Admit: 2015-02-15 | Discharge: 2015-02-17 | DRG: 069 | Disposition: A | Discharge status: Home Health | Payer: Medicare HMO | Attending: Internal Medicine | Admitting: Internal Medicine

## 2015-02-15 DIAGNOSIS — R55 Syncope and collapse: Secondary | ICD-10-CM | POA: Diagnosis present

## 2015-02-15 DIAGNOSIS — Z888 Allergy status to other drugs, medicaments and biological substances status: Secondary | ICD-10-CM

## 2015-02-15 DIAGNOSIS — K219 Gastro-esophageal reflux disease without esophagitis: Secondary | ICD-10-CM | POA: Diagnosis present

## 2015-02-15 DIAGNOSIS — Z79899 Other long term (current) drug therapy: Secondary | ICD-10-CM

## 2015-02-15 DIAGNOSIS — I639 Cerebral infarction, unspecified: Secondary | ICD-10-CM

## 2015-02-15 DIAGNOSIS — Z885 Allergy status to narcotic agent status: Secondary | ICD-10-CM | POA: Diagnosis not present

## 2015-02-15 DIAGNOSIS — E119 Type 2 diabetes mellitus without complications: Secondary | ICD-10-CM | POA: Diagnosis present

## 2015-02-15 DIAGNOSIS — I63311 Cerebral infarction due to thrombosis of right middle cerebral artery: Secondary | ICD-10-CM | POA: Diagnosis not present

## 2015-02-15 DIAGNOSIS — G451 Carotid artery syndrome (hemispheric): Secondary | ICD-10-CM | POA: Diagnosis not present

## 2015-02-15 DIAGNOSIS — Z79891 Long term (current) use of opiate analgesic: Secondary | ICD-10-CM | POA: Diagnosis not present

## 2015-02-15 DIAGNOSIS — Z66 Do not resuscitate: Secondary | ICD-10-CM | POA: Diagnosis present

## 2015-02-15 DIAGNOSIS — Z881 Allergy status to other antibiotic agents status: Secondary | ICD-10-CM | POA: Diagnosis not present

## 2015-02-15 DIAGNOSIS — F419 Anxiety disorder, unspecified: Secondary | ICD-10-CM | POA: Diagnosis present

## 2015-02-15 DIAGNOSIS — M549 Dorsalgia, unspecified: Secondary | ICD-10-CM | POA: Diagnosis present

## 2015-02-15 DIAGNOSIS — Z887 Allergy status to serum and vaccine status: Secondary | ICD-10-CM

## 2015-02-15 DIAGNOSIS — G8191 Hemiplegia, unspecified affecting right dominant side: Secondary | ICD-10-CM | POA: Diagnosis present

## 2015-02-15 DIAGNOSIS — I10 Essential (primary) hypertension: Secondary | ICD-10-CM | POA: Diagnosis present

## 2015-02-15 DIAGNOSIS — G8929 Other chronic pain: Secondary | ICD-10-CM | POA: Diagnosis present

## 2015-02-15 DIAGNOSIS — E039 Hypothyroidism, unspecified: Secondary | ICD-10-CM | POA: Diagnosis present

## 2015-02-15 DIAGNOSIS — R1310 Dysphagia, unspecified: Secondary | ICD-10-CM | POA: Diagnosis present

## 2015-02-15 DIAGNOSIS — Z8542 Personal history of malignant neoplasm of other parts of uterus: Secondary | ICD-10-CM | POA: Diagnosis not present

## 2015-02-15 DIAGNOSIS — G459 Transient cerebral ischemic attack, unspecified: Principal | ICD-10-CM | POA: Diagnosis present

## 2015-02-15 HISTORY — DX: Transient cerebral ischemic attack, unspecified: G45.9

## 2015-02-15 LAB — CBC
HCT: 36.2 % (ref 35.0–47.0)
Hemoglobin: 11.9 g/dL — ABNORMAL LOW (ref 12.0–16.0)
MCH: 31.4 pg (ref 26.0–34.0)
MCHC: 32.8 g/dL (ref 32.0–36.0)
MCV: 95.6 fL (ref 80.0–100.0)
PLATELETS: 83 10*3/uL — AB (ref 150–440)
RBC: 3.78 MIL/uL — ABNORMAL LOW (ref 3.80–5.20)
RDW: 14 % (ref 11.5–14.5)
WBC: 4.2 10*3/uL (ref 3.6–11.0)

## 2015-02-15 LAB — BASIC METABOLIC PANEL
Anion gap: 7 (ref 5–15)
BUN: 18 mg/dL (ref 6–20)
CO2: 20 mmol/L — ABNORMAL LOW (ref 22–32)
CREATININE: 0.89 mg/dL (ref 0.44–1.00)
Calcium: 9 mg/dL (ref 8.9–10.3)
Chloride: 111 mmol/L (ref 101–111)
GFR calc Af Amer: >60 mL/min (ref >60)
GLUCOSE: 152 mg/dL — AB (ref 65–99)
POTASSIUM: 4.3 mmol/L (ref 3.5–5.1)
Sodium: 138 mmol/L (ref 135–145)

## 2015-02-15 LAB — GLUCOSE, CAPILLARY
GLUCOSE-CAPILLARY: 134 mg/dL — AB (ref 65–99)
Glucose-Capillary: 109 mg/dL — ABNORMAL HIGH (ref 65–99)
Glucose-Capillary: 134 mg/dL — ABNORMAL HIGH (ref 65–99)

## 2015-02-15 MED ORDER — ZOLPIDEM TARTRATE 5 MG PO TABS: 5.0000 mg | ORAL_TABLET | Freq: Every day | ORAL | Status: DC | PRN

## 2015-02-15 MED ORDER — ASPIRIN EC 325 MG PO TBEC
325.0000 mg | DELAYED_RELEASE_TABLET | Freq: Every day | ORAL | Status: DC
  Administered 2015-02-17: 325 mg via ORAL
  Filled 2015-02-15 (×3): qty 1

## 2015-02-15 MED ORDER — ENOXAPARIN SODIUM 40 MG/0.4ML ~~LOC~~ SOLN
40.0000 mg | SUBCUTANEOUS | Status: DC
  Administered 2015-02-15 – 2015-02-16 (×2): 40 mg via SUBCUTANEOUS
  Filled 2015-02-15 (×2): qty 0.4

## 2015-02-15 MED ORDER — SENNOSIDES-DOCUSATE SODIUM 8.6-50 MG PO TABS: 1.0000 | ORAL_TABLET | Freq: Every evening | ORAL | Status: DC | PRN

## 2015-02-15 MED ORDER — HYDROCODONE-ACETAMINOPHEN 5-325 MG PO TABS
1.0000 | ORAL_TABLET | Freq: Four times a day (QID) | ORAL | Status: DC | PRN
  Administered 2015-02-15: 1 via ORAL
  Filled 2015-02-15: qty 1

## 2015-02-15 MED ORDER — INSULIN ASPART 100 UNIT/ML ~~LOC~~ SOLN
0.0000 [IU] | Freq: Three times a day (TID) | SUBCUTANEOUS | Status: DC
  Administered 2015-02-17: 2 [IU] via SUBCUTANEOUS
  Filled 2015-02-15: qty 2

## 2015-02-15 MED ORDER — TIZANIDINE HCL 4 MG PO TABS: 4.0000 mg | ORAL_TABLET | Freq: Every day | ORAL | Status: DC | PRN

## 2015-02-15 MED ORDER — LEVOTHYROXINE SODIUM 25 MCG PO TABS
125.0000 ug | ORAL_TABLET | Freq: Every day | ORAL | Status: DC
  Administered 2015-02-17: 125 ug via ORAL
  Filled 2015-02-15: qty 1

## 2015-02-15 MED ORDER — LINAGLIPTIN 5 MG PO TABS
5.0000 mg | ORAL_TABLET | Freq: Every day | ORAL | Status: DC
  Administered 2015-02-17: 5 mg via ORAL
  Filled 2015-02-15 (×2): qty 1

## 2015-02-15 MED ORDER — STROKE: EARLY STAGES OF RECOVERY BOOK: Freq: Once | Status: DC

## 2015-02-15 MED ORDER — ASPIRIN 300 MG RE SUPP
300.0000 mg | Freq: Every day | RECTAL | Status: DC
Start: 2015-02-15 — End: 2015-02-17
  Administered 2015-02-15: 300 mg via RECTAL
  Filled 2015-02-15: qty 1

## 2015-02-15 MED ORDER — ATORVASTATIN CALCIUM 20 MG PO TABS
40.0000 mg | ORAL_TABLET | Freq: Every day | ORAL | Status: DC
  Administered 2015-02-15 – 2015-02-16 (×2): 40 mg via ORAL
  Filled 2015-02-15 (×2): qty 2

## 2015-02-15 MED ORDER — PANTOPRAZOLE SODIUM 40 MG PO TBEC
40.0000 mg | DELAYED_RELEASE_TABLET | Freq: Every day | ORAL | Status: DC
  Administered 2015-02-17: 40 mg via ORAL
  Filled 2015-02-15: qty 1

## 2015-02-15 MED ORDER — ASPIRIN 81 MG PO CHEW: 324.0000 mg | CHEWABLE_TABLET | Freq: Once | ORAL | Status: DC

## 2015-02-15 MED ORDER — MORPHINE SULFATE ER 100 MG PO TBCR
100.0000 mg | EXTENDED_RELEASE_TABLET | Freq: Two times a day (BID) | ORAL | Status: DC
  Administered 2015-02-15 – 2015-02-17 (×4): 100 mg via ORAL
  Filled 2015-02-15 (×4): qty 1

## 2015-02-15 NOTE — Progress Notes (Signed)
Patient states she is dnr dosent want chest compression or resuciation

## 2015-02-15 NOTE — Progress Notes (Signed)
Pt concerned about PO medications since pt is NPO, MD stated to give pt meds only.  Pt also states that she wants to be DNR, Dr Dustin Flock states that he will change pt's code status per pt request.  Lynnda Shields, RN

## 2015-02-15 NOTE — ED Provider Notes (Signed)
[facility] Emergency Department Provider Note  Time seen: 2:36 PM  I have reviewed the triage vital signs and the nursing notes.   HISTORY  Chief Complaint Code Stroke    HPI [name] is a 59 y.o. female the past medical history of diabetes, hypothyroidism, chronic back pain, syncope, anxiety, presents to the emergency department for right upper and lower extremity weakness starting this morning. According to the patient she awoke feeling that her right arm was very heavy. She noted at breakfast around 8 AM she was having a lot of difficulty holding her coffee cup and it felt as if her coffee was extremely heavy, and patient was spilling coffee. Patient finally called her primary care doctor and told her about the right arm heaviness, and right leg weakness, and her primary care doctor referred her to the emergency department for further evaluation. Patient does state a mild headache for the past 2 days, denies chest pain or shortness of breath. Patient denies any history of stroke or TIA in the past. Last known normal was yesterday before going to bed.    Past Medical History  Diagnosis Date  . Diabetes mellitus   . Hypothyroid   . Degenerative disk disease   . Stomach ulcer   . Diverticulitis   . Syncope 01/2015  . Anxiety   . GERD (gastroesophageal reflux disease)   . History of hiatal hernia   . Cancer     HX OF CANCER OF UTERUS     Patient Active Problem List   Diagnosis Date Noted  . Nausea with vomiting 01/12/2015  . Dehydration 01/12/2015  . Essential hypertension 01/12/2015  . Orthostatic hypotension 01/12/2015  . Chronically on opiate therapy 01/12/2015  . Gastroparesis 01/12/2015  . Orthostatic syncope 01/11/2015  . DEPRESSION/ANXIETY 06/27/2007  . MYOFASCIAL PAIN SYNDROME 06/27/2007  . Chronic pain syndrome 03/28/2007  . DM2 (diabetes mellitus, type 2) 03/27/2007  . GERD 03/27/2007  . DIVERTICULOSIS, COLON 03/27/2007  . LUMBAR  DISC DISPLACEMENT 03/27/2007  . PROTEINURIA 03/27/2007  . UTERINE CANCER, HX OF 03/27/2007    Past Surgical History  Procedure Laterality Date  . Hernia repair    . Abdominal hysterectomy    . Cholecystectomy      Current Outpatient Rx  Name  Route  Sig  Dispense  Refill  . HYDROcodone-acetaminophen (NORCO/VICODIN) 5-325 MG per tablet   Oral   Take 1 tablet by mouth every 6 (six) hours as needed for moderate pain.         Marland Kitchen JANUVIA 100 MG tablet   Oral   Take 100 mg by mouth daily.           Dispense as written.   Marland Kitchen levothyroxine (SYNTHROID, LEVOTHROID) 125 MCG tablet   Oral   Take 125 mcg by mouth daily.           Marland Kitchen morphine (MS CONTIN) 100 MG 12 hr tablet   Oral   Take 100 mg by mouth every 12 (twelve) hours.         Marland Kitchen omeprazole (PRILOSEC) 20 MG capsule   Oral   Take 20 mg by mouth daily.         . pantoprazole (PROTONIX) 20 MG tablet   Oral   Take 20 mg by mouth daily.         Marland Kitchen tiZANidine (ZANAFLEX) 4 MG tablet   Oral   Take 4 mg by mouth daily as needed for muscle spasms.          Marland Kitchen  zolpidem (AMBIEN) 5 MG tablet   Oral   Take 5 mg by mouth daily as needed for sleep.            Allergies Codeine sulfate; Erythromycin base; Metformin; Pioglitazone hcl-metformin hcl; Rosiglitazone maleate; Sitagliptin-metformin hcl; and Tetanus-diphtheria toxoids td  No family history on file.  Social History History  Substance Use Topics  . Smoking status: Never Smoker   . Smokeless tobacco: Never Used  . Alcohol Use: No    Review of Systems Constitutional: Negative for fever. Cardiovascular: Negative for chest pain. Respiratory: Negative for shortness of breath. Gastrointestinal: Negative for abdominal pain Genitourinary: Negative for dysuria. Musculoskeletal: Positive for back pain but states this is chronic and unchanged. Neurological: Positive for headache 2 days, positive for right arm and leg weakness/heaviness. Sensation  intact.  10-point ROS otherwise negative.  ____________________________________________   PHYSICAL EXAM:  VITAL SIGNS: ED Triage Vitals  Enc Vitals Group     BP 02/15/15 1312 137/64 mmHg     Pulse Rate 02/15/15 1312 68     Resp 02/15/15 1312 20     Temp 02/15/15 1312 98.5 F (36.9 C)     Temp Source 02/15/15 1312 Oral     SpO2 02/15/15 1312 96 %     Weight 02/15/15 1312 133 lb (60.328 kg)     Height 02/15/15 1312 5' 3"  (1.6 m)     Head Cir --      Peak Flow --      Pain Score 02/15/15 1313 6     Pain Loc --      Pain Edu? --      Excl. in [place]? --     Constitutional: Alert and oriented (but stated the year was 2015). Well appearing and in no distress. ENT   Head: Normocephalic and atraumatic.   Mouth/Throat: Mucous membranes are moist. Cardiovascular: Normal rate, regular rhythm. No murmurs Respiratory: Normal respiratory effort without tachypnea nor retractions. Breath sounds are clear Gastrointestinal: Soft and nontender. No distention.   Musculoskeletal: Nontender with normal range of motion in all extremities.  Neurologic:  Normal speech and language. Patient has mild right upper extremity and right lower extremity weakness 4/5. Patient has moderate difficulty with finger to nose cerebellar testing with the right upper extremity. No notable pronator drift in the upper extremities however patient does have a lower extremity drift on the right after several seconds. Cranial nerves appear intact, no facial droop noted. Skin:  Skin is warm, dry and intact.  Psychiatric: Mood and affect are normal. Speech and behavior are normal.   ____________________________________________    EKG  EKG reviewed and interpreted by myself shows normal sinus rhythm at 66 bpm, narrow QRS, normal axis, normal intervals, nonspecific but no concerning ST changes noted.  ____________________________________________    RADIOLOGY  CT head within normal  limits  ____________________________________________   INITIAL IMPRESSION / ASSESSMENT AND PLAN / ED COURSE  Pertinent labs & imaging results that were available during my care of the patient were reviewed by me and considered in my medical decision making (see chart for details).  Patient presents the emergency department with right upper and lower extremity weakness starting since this morning. Patient is out of any TPA window with no clear onset of symptoms, but definitely over 4 hours ago. Patient with a current NIH stroke scale of 2, but does have difficulty with finger to nose the right upper extremity, drift with the right lower extremity, weakness in the right upper extremity and  right lower extremity. CT head is within normal limits, blood work is within normal limits. I will discuss with the hospitalist for possible admission vs MRI in the ED. Patient will be admitted for CVA workup.   NIH Stroke Scale   Time: 2:44 PM Person Administering Scale: Evadna Donaghy  Administer stroke scale items in the order listed. Record performance in each category after each subscale exam. Do not go back and change scores. Follow directions provided for each exam technique. Scores should reflect what the patient does, not what the clinician thinks the patient can do. The clinician should record answers while administering the exam and work quickly. Except where indicated, the patient should not be coached (i.e., repeated requests to patient to make a special effort).   1a  Level of consciousness: 0=alert; keenly responsive  1b. LOC questions:  0=Performs both tasks correctly  1c. LOC commands: 0=Performs both tasks correctly  2.  Best Gaze: 0=normal  3.  Visual: 0=No visual loss  4. Facial Palsy: 0=Normal symmetric movement  5a.  Motor left arm: 0=No drift, limb holds 90 (or 45) degrees for full 10 seconds  5b.  Motor right arm: 0=No drift, limb holds 90 (or 45) degrees for full 10 seconds   6a. motor left leg: 0=No drift, limb holds 90 (or 45) degrees for full 10 seconds  6b  Motor right leg:  1=Drift, limb holds 90 (or 45) degrees but drifts down before full 10 seconds: does not hit bed  7. Limb Ataxia: 1=Present in one limb  8.  Sensory: 0=Normal; no sensory loss  9. Best Language:  0=No aphasia, normal  10. Dysarthria: 0=Normal  11. Extinction and Inattention: 0=No abnormality  12. Distal motor function: 0=Normal   Total:   2    ____________________________________________   FINAL CLINICAL IMPRESSION(S) / ED DIAGNOSES  CVA   Harvest Dark, MD 02/15/15 [phone]

## 2015-02-15 NOTE — H&P (Signed)
[name] at [place] NAME: [name]    MR#:  [id number]  DATE OF BIRTH:  Dec 23, 1955  DATE OF ADMISSION:  02/15/2015  PRIMARY CARE PHYSICIAN: Rita Chou, NP   REQUESTING/REFERRING PHYSICIAN: Dr. Kerman Passey  CHIEF COMPLAINT:   Chief Complaint  Patient presents with  . Code Stroke    HISTORY OF PRESENT ILLNESS:  [name] is a 59 y.o. female the past medical history of diabetes, hypothyroidism, chronic back pain, syncope, anxiety, presents to the emergency department for right upper and lower extremity weakness starting this morning. According to the patient she awoke feeling that her right arm was very heavy. She noted at breakfast around 8 AM she was having a lot of difficulty holding her coffee cup and it felt as if her coffee was extremely heavy, and patient was spilling coffee. Patient finally called her primary care doctor and told her about the right arm heaviness, and right leg weakness, and her primary care doctor referred her to the emergency department for further evaluation. Patient does state a mild headache for the past 2 days, denies chest pain or shortness of breath. Has chronic dysphagia. Also chronic back pain.  PAST MEDICAL HISTORY:   Past Medical History  Diagnosis Date  . Diabetes mellitus   . Hypothyroid   . Degenerative disk disease   . Stomach ulcer   . Diverticulitis   . Syncope 01/2015  . Anxiety   . GERD (gastroesophageal reflux disease)   . History of hiatal hernia   . Cancer     HX OF CANCER OF UTERUS     PAST SURGICAL HISTORY:  Past Surgical History  Procedure Laterality Date  . Hernia repair    . Abdominal hysterectomy    . Cholecystectomy      SOCIAL HISTORY:  History  Substance Use Topics  . Smoking status: Never Smoker   . Smokeless tobacco: Never Used  . Alcohol Use: No    FAMILY HISTORY: No family history on file.  DRUG ALLERGIES:  Allergies  Allergen Reactions  .  Erythromycin Base Other (See Comments)    Fever  . Metformin     REACTION: Loose stools  . Pioglitazone Hcl-Metformin Hcl     REACTION: Diarrhea  . Rosiglitazone Maleate     REACTION: Swelling  . Sitagliptin-Metformin Hcl     REACTION: Rash  . Codeine Sulfate Rash  . Tetanus-Diphtheria Toxoids Td Rash and Other (See Comments)    Fever    REVIEW OF SYSTEMS:   CONSTITUTIONAL: No fever, fatigue or weakness.  EYES: No blurred or double vision. Wears glasses EARS, NOSE, AND THROAT: No tinnitus or ear pain.  RESPIRATORY: No cough, shortness of breath, wheezing or hemoptysis.  CARDIOVASCULAR: No chest pain, orthopnea, edema.  GASTROINTESTINAL: No nausea, vomiting, diarrhea or abdominal pain.  GENITOURINARY: No dysuria, hematuria.  ENDOCRINE: No polyuria, nocturia,  HEMATOLOGY: No anemia, easy bruising or bleeding SKIN: No rash or lesion. MUSCULOSKELETAL: No joint pain or arthritis.   NEUROLOGIC: No tingling, right upper extremity and right lower extremity weakness and numbness PSYCHIATRY: Positive anxiety or depression.   MEDICATIONS AT HOME:  Prior to Admission medications   Medication Sig Start Date End Date Taking? Authorizing Provider  HYDROcodone-acetaminophen (NORCO/VICODIN) 5-325 MG per tablet Take 1 tablet by mouth every 6 (six) hours as needed for moderate pain.   Yes Historical Provider, MD  JANUVIA 100 MG tablet Take 100 mg by mouth daily. 12/15/14  Yes Historical  Provider, MD  levothyroxine (SYNTHROID, LEVOTHROID) 125 MCG tablet Take 125 mcg by mouth daily.     Yes Historical Provider, MD  morphine (MS CONTIN) 100 MG 12 hr tablet Take 100 mg by mouth every 12 (twelve) hours.   Yes Historical Provider, MD  omeprazole (PRILOSEC) 20 MG capsule Take 20 mg by mouth daily. 10/08/14  Yes Historical Provider, MD  pantoprazole (PROTONIX) 20 MG tablet Take 20 mg by mouth daily. 11/10/14  Yes Historical Provider, MD  tiZANidine (ZANAFLEX) 4 MG tablet Take 4 mg by mouth daily as needed  for muscle spasms.  12/14/14  Yes Historical Provider, MD  zolpidem (AMBIEN) 5 MG tablet Take 5 mg by mouth daily as needed for sleep.  12/14/14  Yes Historical Provider, MD      PHYSICAL EXAMINATION:   VITAL SIGNS: Blood pressure 157/69, pulse 62, temperature 98.5 F (36.9 C), temperature source Oral, resp. rate 14, height 5' 3"  (1.6 m), weight 60.328 kg (133 lb), SpO2 97 %.  GENERAL:  59 y.o.-year-old patient lying in the bed with no acute distress.  EYES: Pupils equal, round, reactive to light and accommodation. No scleral icterus. Extraocular muscles intact.  HEENT: Head atraumatic, normocephalic. Oropharynx and nasopharynx clear.  NECK:  Supple, no jugular venous distention. No thyroid enlargement, no tenderness.  LUNGS: Normal breath sounds bilaterally, no wheezing, rales,rhonchi or crepitation. No use of accessory muscles of respiration.  CARDIOVASCULAR: S1, S2 normal. No murmurs, rubs, or gallops.  ABDOMEN: Soft, nontender, nondistended. Bowel sounds present. No organomegaly or mass.  EXTREMITIES: No pedal edema, cyanosis, or clubbing.  NEUROLOGIC: Cranial nerves II through XII are intact. Muscle strength 4/5 in right upper and right lower extremity. Sensation intact. Gait not checked.  PSYCHIATRIC: The patient is alert and oriented x 3.  SKIN: No obvious rash, lesion, or ulcer.   LABORATORY PANEL:   CBC  Recent Labs Lab 02/15/15 1325  WBC 4.2  HGB 11.9*  HCT 36.2  PLT 83*  MCV 95.6  MCH 31.4  MCHC 32.8  RDW 14.0   ------------------------------------------------------------------------------------------------------------------  Chemistries   Recent Labs Lab 02/15/15 1325  NA 138  K 4.3  CL 111  CO2 20*  GLUCOSE 152*  BUN 18  CREATININE 0.89  CALCIUM 9.0   ------------------------------------------------------------------------------------------------------------------ estimated creatinine clearance is 54.9 mL/min (by C-G formula based on Cr of  0.89). ------------------------------------------------------------------------------------------------------------------ No results for input(s): TSH, T4TOTAL, T3FREE, THYROIDAB in the last 72 hours.  Invalid input(s): FREET3   Coagulation profile No results for input(s): INR, PROTIME in the last 168 hours. ------------------------------------------------------------------------------------------------------------------- No results for input(s): DDIMER in the last 72 hours. -------------------------------------------------------------------------------------------------------------------  Cardiac Enzymes No results for input(s): CKMB, TROPONINI, MYOGLOBIN in the last 168 hours.  Invalid input(s): CK ------------------------------------------------------------------------------------------------------------------ Invalid input(s): POCBNP  ---------------------------------------------------------------------------------------------------------------  Urinalysis    Component Value Date/Time   COLORURINE AMBER* 01/10/2015 2235   APPEARANCEUR CLOUDY* 01/10/2015 2235   LABSPEC 1.018 01/10/2015 2235   PHURINE 7.0 01/10/2015 2235   GLUCOSEU NEGATIVE 01/10/2015 2235   HGBUR SMALL* 01/10/2015 2235   BILIRUBINUR NEGATIVE 01/10/2015 2235   KETONESUR NEGATIVE 01/10/2015 2235   PROTEINUR 30* 01/10/2015 2235   UROBILINOGEN 1.0 01/10/2015 2235   NITRITE NEGATIVE 01/10/2015 2235   LEUKOCYTESUR NEGATIVE 01/10/2015 2235     RADIOLOGY: Ct Head Wo Contrast  02/15/2015   ADDENDUM REPORT: 02/15/2015 14:03  ADDENDUM: Study discussed by telephone with Dr. Lennette Bihari PADUCHOWSKI on 02/15/2015 at 1356 hrs.   Electronically Signed   By: Genevie Ann M.D.   On:  02/15/2015 14:03   02/15/2015   CLINICAL DATA:  59 year old female code stroke. Right extremity weakness. Symptoms since 0830 hrs. Initial encounter.  EXAM: CT HEAD WITHOUT CONTRAST  TECHNIQUE: Contiguous axial images were obtained from the base of the skull  through the vertex without intravenous contrast.  COMPARISON:  10/08/2014 and earlier.  FINDINGS: Visualized paranasal sinuses and mastoids are clear. No acute osseous abnormality identified. Visualized orbits and scalp soft tissues are within normal limits.  Calcified atherosclerosis at the skull base. Cerebral volume is within normal limits for age. No ventriculomegaly. No midline shift, mass effect, or evidence of intracranial mass lesion. No evidence of cortically based acute infarction identified. No suspicious intracranial vascular hyperdensity. Stable and normal gray-white matter differentiation. No acute intracranial hemorrhage identified.  IMPRESSION: Stable and normal for age non contrast CT appearance of the brain.  Electronically Signed: By: Genevie Ann M.D. On: 02/15/2015 13:52    EKG: Orders placed or performed during the hospital encounter of 02/15/15  . ED EKG  . ED EKG    IMPRESSION AND PLAN: Patient is a 59 year old white female with multiple medical problems presents with complaint of right upper and right lower extremity weakness and numbness.   1. Right upper extremity weakness and right lower extremity weakness: Suspect due to acute CVA, patient not on aspirin and she'll be started on aspirin. We will also started on atorvastatin. Check carotid Dopplers and echocardiogram of the heart, MRI of the brain. Place on telemetry. Neuro checks  2.  Diabetes type 2: Check a hemoglobin A1c, sliding scale insulin, continue Januvia  3. Hypothyroidism: Continue levothyroxine  4, chronic pain in the back: Continue hydrocodone acetaminophen, and tiazedine  5. Chronic dysphagia: Continue Protonix  6.  Miscellaneous: Lovenox for DVT prophylaxis      All the records are reviewed and case discussed with ED provider. Management plans discussed with the patient, family and they are in agreement.  CODE STATUS:Full   TOTAL TIME TAKING CARE OF THIS PATIENT: 20mnutes.    PDustin Flock M.D on 02/15/2015 at 3:26 PM  Between 7am to 6pm - Pager - [phone]  After 6pm go to [email or web address] - password [email or web address] [facility]  Office  [phone] CC: Primary care physician; BAlvester Chou NP

## 2015-02-15 NOTE — Consult Note (Signed)
Chief Complaint: RUE and RLE heaviness   HPI: [name] is an 59 y.o. female with past medical history of diabetes, hypothyroidism, chronic back pain, syncope, anxiety, presents to the emergency department for right upper and lower extremity weakness starting this morning. According to the patient she awoke feeling that her right arm was very heavy.  Pt is able to move her RUE and RLE but they are heavy.  RUE and RLE numbness. No facial weakness or speech abnormalities.      Past Medical History  Diagnosis Date  . Diabetes mellitus   . Hypothyroid   . Degenerative disk disease   . Stomach ulcer   . Diverticulitis   . Syncope 01/2015  . Anxiety   . GERD (gastroesophageal reflux disease)   . History of hiatal hernia   . Cancer     HX OF CANCER OF UTERUS     Past Surgical History  Procedure Laterality Date  . Hernia repair    . Abdominal hysterectomy    . Cholecystectomy      No family history on file. Social History:  reports that she has never smoked. She has never used smokeless tobacco. She reports that she does not drink alcohol or use illicit drugs.  Allergies:  Allergies  Allergen Reactions  . Erythromycin Base Other (See Comments)    Fever  . Metformin     REACTION: Loose stools  . Pioglitazone Hcl-Metformin Hcl     REACTION: Diarrhea  . Rosiglitazone Maleate     REACTION: Swelling  . Sitagliptin-Metformin Hcl     REACTION: Rash  . Codeine Sulfate Rash  . Tetanus-Diphtheria Toxoids Td Rash and Other (See Comments)    Fever    Medications: I have reviewed the patient's current medications.  ROS: History obtained from the patient  General ROS: negative for - chills, fatigue, fever, night sweats, weight gain or weight loss Psychological ROS: negative for - behavioral disorder, hallucinations, memory difficulties, mood swings or suicidal ideation Ophthalmic ROS: negative for - blurry vision, double vision, eye pain or loss of vision ENT ROS: negative  for - epistaxis, nasal discharge, oral lesions, sore throat, tinnitus or vertigo Allergy and Immunology ROS: negative for - hives or itchy/watery eyes Hematological and Lymphatic ROS: negative for - bleeding problems, bruising or swollen lymph nodes Endocrine ROS: negative for - galactorrhea, hair pattern changes, polydipsia/polyuria or temperature intolerance Respiratory ROS: negative for - cough, hemoptysis, shortness of breath or wheezing Cardiovascular ROS: negative for - chest pain, dyspnea on exertion, edema or irregular heartbeat Gastrointestinal ROS: negative for - abdominal pain, diarrhea, hematemesis, nausea/vomiting or stool incontinence Genito-Urinary ROS: negative for - dysuria, hematuria, incontinence or urinary frequency/urgency Musculoskeletal ROS: negative for - joint swelling or muscular weakness Neurological ROS: as noted in HPI Dermatological ROS: negative for rash and skin lesion changes  Physical Examination: Blood pressure 173/61, pulse 57, temperature 98.1 F (36.7 C), temperature source Oral, resp. rate 14, height 5' 3"  (1.6 m), weight 66.361 kg (146 lb 4.8 oz), SpO2 99 %.    Neurological Examination Mental Status: Alert, oriented, thought content appropriate.  Speech fluent without evidence of aphasia.  Able to follow 3 step commands without difficulty. Cranial Nerves: II: Discs flat bilaterally; Visual fields grossly normal, pupils equal, round, reactive to light and accommodation III,IV, VI: ptosis not present, extra-ocular motions intact bilaterally V,VII: smile symmetric, facial light touch sensation normal bilaterally VIII: hearing normal bilaterally IX,X: gag reflex present XI: bilateral shoulder shrug XII: midline tongue extension  Motor: Right : Upper extremity   5/5    Left:     Upper extremity   4/5  Lower extremity   5/5     Lower extremity   4/5 Tone and bulk:normal tone throughout; no atrophy noted Sensory: decreased to light touch Deep Tendon  Reflexes: 1+ and symmetric throughout Plantars: Right: downgoing   Left: downgoing Cerebellar: normal finger-to-nose, normal rapid alternating movements and normal heel-to-shin test Gait: unable to examine.        Laboratory Studies:  Basic Metabolic Panel:  Recent Labs Lab 02/15/15 1325  NA 138  K 4.3  CL 111  CO2 20*  GLUCOSE 152*  BUN 18  CREATININE 0.89  CALCIUM 9.0    Liver Function Tests: No results for input(s): AST, ALT, ALKPHOS, BILITOT, PROT, ALBUMIN in the last 168 hours. No results for input(s): LIPASE, AMYLASE in the last 168 hours. No results for input(s): AMMONIA in the last 168 hours.  CBC:  Recent Labs Lab 02/15/15 1325  WBC 4.2  HGB 11.9*  HCT 36.2  MCV 95.6  PLT 83*    Cardiac Enzymes: No results for input(s): CKTOTAL, CKMB, CKMBINDEX, TROPONINI in the last 168 hours.  BNP: Invalid input(s): POCBNP  CBG:  Recent Labs Lab 02/15/15 1633  GLUCAP 134*    Microbiology: Results for orders placed or performed in visit on 10/08/14  Culture, blood (single)     Status: None   Collection Time: 10/08/14 12:22 AM  Result Value Ref Range Status   Micro Text Report   Final       COMMENT                   NO GROWTH AEROBICALLY/ANAEROBICALLY IN 5 DAYS   ANTIBIOTIC                                                      Culture, blood (single)     Status: None   Collection Time: 10/08/14  1:35 AM  Result Value Ref Range Status   Micro Text Report   Final       COMMENT                   NO GROWTH AEROBICALLY/ANAEROBICALLY IN 5 DAYS   ANTIBIOTIC                                                        Coagulation Studies: No results for input(s): LABPROT, INR in the last 72 hours.  Urinalysis: No results for input(s): COLORURINE, LABSPEC, PHURINE, GLUCOSEU, HGBUR, BILIRUBINUR, KETONESUR, PROTEINUR, UROBILINOGEN, NITRITE, LEUKOCYTESUR in the last 168 hours.  Invalid input(s): APPERANCEUR  Lipid Panel: No results found for: CHOL, TRIG,  HDL, CHOLHDL, VLDL, LDLCALC  HgbA1C:  Lab Results  Component Value Date   HGBA1C 6.7* 01/12/2015    Urine Drug Screen:     Component Value Date/Time   LABOPIA POSITIVE* 11/02/2007 0055   COCAINSCRNUR NONE DETECTED 11/02/2007 0055   LABBENZ NONE DETECTED 11/02/2007 0055   AMPHETMU NONE DETECTED 11/02/2007 0055   THCU NONE DETECTED 11/02/2007 0055   LABBARB  11/02/2007 0055    NONE DETECTED  DRUG SCREEN FOR MEDICAL PURPOSES ONLY.  IF CONFIRMATION IS NEEDED FOR ANY PURPOSE, NOTIFY LAB WITHIN 5 DAYS.    Alcohol Level: No results for input(s): ETH in the last 168 hours.   Imaging: Ct Head Wo Contrast  02/15/2015   ADDENDUM REPORT: 02/15/2015 14:03  ADDENDUM: Study discussed by telephone with Dr. Lennette Bihari PADUCHOWSKI on 02/15/2015 at 1356 hrs.   Electronically Signed   By: Genevie Ann M.D.   On: 02/15/2015 14:03   02/15/2015   CLINICAL DATA:  59 year old female code stroke. Right extremity weakness. Symptoms since 0830 hrs. Initial encounter.  EXAM: CT HEAD WITHOUT CONTRAST  TECHNIQUE: Contiguous axial images were obtained from the base of the skull through the vertex without intravenous contrast.  COMPARISON:  10/08/2014 and earlier.  FINDINGS: Visualized paranasal sinuses and mastoids are clear. No acute osseous abnormality identified. Visualized orbits and scalp soft tissues are within normal limits.  Calcified atherosclerosis at the skull base. Cerebral volume is within normal limits for age. No ventriculomegaly. No midline shift, mass effect, or evidence of intracranial mass lesion. No evidence of cortically based acute infarction identified. No suspicious intracranial vascular hyperdensity. Stable and normal gray-white matter differentiation. No acute intracranial hemorrhage identified.  IMPRESSION: Stable and normal for age non contrast CT appearance of the brain.  Electronically Signed: By: Genevie Ann M.D. On: 02/15/2015 13:52    Assessment: 59 y.o. female with past medical history of  diabetes, hypothyroidism, chronic back pain, syncope, anxiety, presents to the emergency department for right upper and lower extremity weakness starting this morning. According to the patient she awoke feeling that her right arm was very heavy.  Pt is able to move her RUE and RLE but they are heavy.  RUE and RLE numbness. No facial weakness or speech abnormalities.    Pt was not on ASA prior to admission.   Stroke Risk Factors - diabetes mellitus, family history and hypertension  Plan: 1. HgbA1c of 6.9  2. MRI, e brain without contrast 3. PT consult, OT consult, Speech consult 4. Echocardiogram 5. Carotid dopplers 6. Prophylactic therapy-Antiplatelet med: Aspirin - dose 325/day 7. Telemetry monitoring 8. Frequent neuro checks 9. Statin  Leotis Pain  02/15/2015, 7:24 PM

## 2015-02-15 NOTE — ED Notes (Signed)
Patient presents to the ED with right arm and right leg weakness.  Patient's mouth appears uneven, with droop to the left side.  Patient is speaking in full sentences without difficulty.  Patient denies any confusion.  Patient states she woke up this morning around 8:30am and started having weakness in her right arm, was unable to hold her cup of coffee.  Patient states weakness has progressed throughout the day.  Patient called her pcp 1 hour prior to arrival in ED and was instructed to come to the ED.  Patient reports upper arm pain also.

## 2015-02-15 NOTE — Progress Notes (Signed)
ANTICOAGULATION CONSULT NOTE - Initial Consult  Pharmacy Consult for Lovenox Indication: VTE prophylaxis  Allergies  Allergen Reactions  . Erythromycin Base Other (See Comments)    Fever  . Metformin     REACTION: Loose stools  . Pioglitazone Hcl-Metformin Hcl     REACTION: Diarrhea  . Rosiglitazone Maleate     REACTION: Swelling  . Sitagliptin-Metformin Hcl     REACTION: Rash  . Codeine Sulfate Rash  . Tetanus-Diphtheria Toxoids Td Rash and Other (See Comments)    Fever    Patient Measurements: Height: 5' 3"  (160 cm) Weight: 146 lb 4.8 oz (66.361 kg) IBW/kg (Calculated) : 52.4 Heparin Dosing Weight:   Vital Signs: Temp: 98.1 F (36.7 C) (06/07 1634) Temp Source: Oral (06/07 1634) BP: 173/61 mmHg (06/07 1634) Pulse Rate: 57 (06/07 1634)  Labs:  Recent Labs  02/15/15 1325  HGB 11.9*  HCT 36.2  PLT 83*  CREATININE 0.89    Estimated Creatinine Clearance: 60.8 mL/min (by C-G formula based on Cr of 0.89).   Medical History: Past Medical History  Diagnosis Date  . Diabetes mellitus   . Hypothyroid   . Degenerative disk disease   . Stomach ulcer   . Diverticulitis   . Syncope 01/2015  . Anxiety   . GERD (gastroesophageal reflux disease)   . History of hiatal hernia   . Cancer     HX OF CANCER OF UTERUS     Medications:  Prescriptions prior to admission  Medication Sig Dispense Refill Last Dose  . HYDROcodone-acetaminophen (NORCO/VICODIN) 5-325 MG per tablet Take 1 tablet by mouth every 6 (six) hours as needed for moderate pain.   02/14/2015 at 2200  . JANUVIA 100 MG tablet Take 100 mg by mouth daily.   02/14/2015 at 2200  . levothyroxine (SYNTHROID, LEVOTHROID) 125 MCG tablet Take 125 mcg by mouth daily.     02/15/2015 at Unknown time  . morphine (MS CONTIN) 100 MG 12 hr tablet Take 100 mg by mouth every 12 (twelve) hours.   02/14/2015 at 2200  . omeprazole (PRILOSEC) 20 MG capsule Take 20 mg by mouth daily.   02/14/2015 at 2200  . pantoprazole (PROTONIX) 20 MG  tablet Take 20 mg by mouth daily.   02/14/2015 at 2200  . tiZANidine (ZANAFLEX) 4 MG tablet Take 4 mg by mouth daily as needed for muscle spasms.    02/14/2015 at 2200  . zolpidem (AMBIEN) 5 MG tablet Take 5 mg by mouth daily as needed for sleep.    02/14/2015 at 2200    Assessment: CrCl = 60.8 ml/min Wt = 66.4 kg  Goal of Therapy:  DVT prophylaxis   Plan:  Lovenox 30 mg SQ Q24H originally ordered.   Will increase to Lovenox 40 mg SQ Q24H based on CrCl > 30 ml/min.   Nina Mondor D 02/15/2015,5:57 PM

## 2015-02-16 ENCOUNTER — Inpatient Hospital Stay: Payer: Medicare HMO

## 2015-02-16 ENCOUNTER — Inpatient Hospital Stay
Admit: 2015-02-16 | Discharge: 2015-02-16 | Disposition: A | Discharge status: Home / Self Care | Payer: Medicare HMO | Attending: Internal Medicine | Admitting: Internal Medicine

## 2015-02-16 DIAGNOSIS — G451 Carotid artery syndrome (hemispheric): Secondary | ICD-10-CM

## 2015-02-16 LAB — GLUCOSE, CAPILLARY
GLUCOSE-CAPILLARY: 255 mg/dL — AB (ref 65–99)
Glucose-Capillary: 120 mg/dL — ABNORMAL HIGH (ref 65–99)
Glucose-Capillary: 146 mg/dL — ABNORMAL HIGH (ref 65–99)
Glucose-Capillary: 162 mg/dL — ABNORMAL HIGH (ref 65–99)

## 2015-02-16 LAB — LIPID PANEL
CHOLESTEROL: 136 mg/dL (ref 0–200)
HDL: 48 mg/dL (ref >40)
LDL CALC: 65 mg/dL (ref 0–99)
TRIGLYCERIDES: 117 mg/dL (ref <150)
Total CHOL/HDL Ratio: 2.8 RATIO
VLDL: 23 mg/dL (ref 0–40)

## 2015-02-16 LAB — HEMOGLOBIN A1C: Hgb A1c MFr Bld: 7.4 % — ABNORMAL HIGH (ref 4.0–6.0)

## 2015-02-16 MED ORDER — HYDRALAZINE HCL 20 MG/ML IJ SOLN
10.0000 mg | Freq: Four times a day (QID) | INTRAMUSCULAR | Status: DC | PRN
  Administered 2015-02-16: 10 mg via INTRAVENOUS
  Filled 2015-02-16: qty 1

## 2015-02-16 MED ORDER — ONDANSETRON HCL 4 MG/2ML IJ SOLN
INTRAMUSCULAR | Status: AC
  Administered 2015-02-16: 4 mg via INTRAVENOUS
  Filled 2015-02-16: qty 2

## 2015-02-16 MED ORDER — ONDANSETRON HCL 4 MG/2ML IJ SOLN
4.0000 mg | Freq: Four times a day (QID) | INTRAMUSCULAR | Status: DC | PRN
  Administered 2015-02-16 – 2015-02-17 (×2): 4 mg via INTRAVENOUS
  Filled 2015-02-16: qty 2

## 2015-02-16 NOTE — Progress Notes (Signed)
PT Contraindication Note  Patient Details Name: [name] MRN: [id number] DOB: 01/12/1956   Cancelled Treatment:    Reason Eval/Treat Not Completed: Medical issues which prohibited therapy. Attempted to see patient multiple times this morning but she is out of the room for testing. Final attempt this morning BP is 209/82 which is contraindicated for therapy. Will attempt evaluation on later date/time as pt is available and BP is controlled.  Lyndel Safe Braylie Badami PT, DPT Luke Rigsbee 02/16/2015, 11:54 AM

## 2015-02-16 NOTE — Care Management (Signed)
Presents from home. Lives with her husband and sons.  Patient per Epic had a hospitalization at [facility] and physical therapy had recommended home health physical therapy, but did not see order or evidence that the recommendation resulted in home health referral.  Patient says is not receiving any services in the home and that her last admission to [facility] was in February.   Patient is disabled due to DJD of spine. Physical therapy consult is pending.  SLP has cleared

## 2015-02-16 NOTE — Evaluation (Signed)
Occupational Therapy Evaluation Patient Details Name: AVYANA PUFFENBARGER MRN: [id number] DOB: [birth year] Today's Date: 02/16/2015    History of Present Illness This patient is a 59 year old female who came to [facility] with left sided weakness.   Clinical Impression   This patient is a 59 year old female who came to [facility] stroke symptoms. She lives in a mobile home with ramp to enter. She had been independent with ADL and functional mobility and driving using a cane inside and walker outside.  L UE strength is WNL and grip is 30 lbs. R UE strength shoulder is 3-/5  Elbow wrist forearm is 3/5 grip is 1 lb. Sensation on the right is diminished for light touch, temperature, and sharp.  Patient able to dress lower body with contact guard assist   (tying shoes not tested). Patient would benefit from Occupational Therapy for ADL/functioal mobility training and neuromuscular reeducation and strength. Therex completed were active assistive range of motion R upper extremity  exercises shoulder flexion, external and internal rotation elbow flexion and extension, forearm supination and pronation, wrist flexion and extension, and hand flexion and extension 10 repetitions.          Follow Up Recommendations       Equipment Recommendations       Recommendations for Other Services       Precautions / Restrictions Precautions Precautions: Fall Restrictions Weight Bearing Restrictions: No      Mobility Bed Mobility Overal bed mobility: Modified Independent                Transfers Overall transfer level:  (contact guard)                    Balance Overall balance assessment:  (needs congact guard)                                          ADL Overall ADL's : Modified independent (previous)                                             Vision     Perception     Praxis      Pertinent Vitals/Pain       Hand  Dominance Right   Extremity/Trunk Assessment Upper Extremity Assessment Upper Extremity Assessment:  (limits in R UE)           Communication Communication Communication: No difficulties   Cognition Arousal/Alertness: Awake/alert Behavior During Therapy: WFL for tasks assessed/performed                       General Comments       Exercises       Shoulder Instructions      Home Living Family/patient expects to be discharged to:: Private residence (mobile home with ramp entrance.) Living Arrangements: Spouse/significant other;Children (grown) Available Help at Discharge: Family Type of Home: Mobile home Home Access: Ramped entrance     Home Layout: One level     Bathroom Shower/Tub: Tub/shower unit                    Prior Functioning/Environment Level of Independence: Independent with assistive device(s) (cane in side and walker outside)  Comments: Drives    OT Diagnosis: Generalized weakness (Right sided weakness)   OT Problem List: Decreased strength;Decreased range of motion;Decreased activity tolerance;Impaired balance (sitting and/or standing);Decreased coordination;Impaired sensation;Impaired UE functional use;Pain   OT Treatment/Interventions: Self-care/ADL training;Therapeutic exercise;Neuromuscular education    OT Goals(Current goals can be found in the care plan section) Acute Rehab OT Goals Patient Stated Goal: Wants home with home therapy Potential to Achieve Goals: Good  OT Frequency: Min 1X/week   Barriers to D/C:            Co-evaluation              End of Session Equipment Utilized During Treatment: Gait belt  Activity Tolerance:   Patient left:     Time: 3225-6720 OT Time Calculation (min): 35 min Charges:  OT General Charges $OT Visit: 1 Procedure OT Evaluation $Initial OT Evaluation Tier I: 1 Procedure OT Treatments $Therapeutic Exercise: 8-22 mins G-Codes:    Myrene Galas,  MS/OTR/L  02/16/2015, 4:48 PM

## 2015-02-16 NOTE — Plan of Care (Signed)
Problem: Acute Rehab OT Goals (only OT should resolve) Goal: OT Additional ADL Goal #1 Will improve R upper extremity strength to 3+/5 with full range of motion.

## 2015-02-16 NOTE — Consult Note (Signed)
Chief Complaint: RUE and RLE heaviness   HPI: [name] is an 58 y.o. female with past medical history of diabetes, hypothyroidism, chronic back pain, syncope, anxiety, presents to the emergency department for right upper and lower extremity weakness starting this morning. According to the patient she awoke feeling that her right arm was very heavy.  Pt is able to move her RUE and RLE but they are heavy.  RUE and RLE numbness. No facial weakness or speech abnormalities.      Past Medical History  Diagnosis Date  . Diabetes mellitus   . Hypothyroid   . Degenerative disk disease   . Stomach ulcer   . Diverticulitis   . Syncope 01/2015  . Anxiety   . GERD (gastroesophageal reflux disease)   . History of hiatal hernia   . Cancer     HX OF CANCER OF UTERUS     Past Surgical History  Procedure Laterality Date  . Hernia repair    . Abdominal hysterectomy    . Cholecystectomy      No family history on file. Social History:  reports that she has never smoked. She has never used smokeless tobacco. She reports that she does not drink alcohol or use illicit drugs.  Allergies:  Allergies  Allergen Reactions  . Erythromycin Base Other (See Comments)    Fever  . Metformin     REACTION: Loose stools  . Pioglitazone Hcl-Metformin Hcl     REACTION: Diarrhea  . Rosiglitazone Maleate     REACTION: Swelling  . Sitagliptin-Metformin Hcl     REACTION: Rash  . Codeine Sulfate Rash  . Tetanus-Diphtheria Toxoids Td Rash and Other (See Comments)    Fever    Medications: I have reviewed the patient's current medications.  ROS: History obtained from the patient  General ROS: negative for - chills, fatigue, fever, night sweats, weight gain or weight loss Psychological ROS: negative for - behavioral disorder, hallucinations, memory difficulties, mood swings or suicidal ideation Ophthalmic ROS: negative for - blurry vision, double vision, eye pain or loss of vision ENT ROS: negative  for - epistaxis, nasal discharge, oral lesions, sore throat, tinnitus or vertigo Allergy and Immunology ROS: negative for - hives or itchy/watery eyes Hematological and Lymphatic ROS: negative for - bleeding problems, bruising or swollen lymph nodes Endocrine ROS: negative for - galactorrhea, hair pattern changes, polydipsia/polyuria or temperature intolerance Respiratory ROS: negative for - cough, hemoptysis, shortness of breath or wheezing Cardiovascular ROS: negative for - chest pain, dyspnea on exertion, edema or irregular heartbeat Gastrointestinal ROS: negative for - abdominal pain, diarrhea, hematemesis, nausea/vomiting or stool incontinence Genito-Urinary ROS: negative for - dysuria, hematuria, incontinence or urinary frequency/urgency Musculoskeletal ROS: negative for - joint swelling or muscular weakness Neurological ROS: as noted in HPI Dermatological ROS: negative for rash and skin lesion changes  Physical Examination: Blood pressure 209/82, pulse 66, temperature 98.5 F (36.9 C), temperature source Oral, resp. rate 19, height 5' 3"  (1.6 m), weight 67.813 kg (149 lb 8 oz), SpO2 97 %.    Neurological Examination Mental Status: Alert, oriented, thought content appropriate.  Speech fluent without evidence of aphasia.  Able to follow 3 step commands without difficulty. Cranial Nerves: II: Discs flat bilaterally; Visual fields grossly normal, pupils equal, round, reactive to light and accommodation III,IV, VI: ptosis not present, extra-ocular motions intact bilaterally V,VII: smile symmetric, facial light touch sensation normal bilaterally VIII: hearing normal bilaterally IX,X: gag reflex present XI: bilateral shoulder shrug XII: midline tongue extension  Motor: Right : Upper extremity   5/5    Left:     Upper extremity   4/5  Lower extremity   5/5     Lower extremity   4/5 Tone and bulk:normal tone throughout; no atrophy noted Sensory: decreased to light touch Deep Tendon  Reflexes: 1+ and symmetric throughout Plantars: Right: downgoing   Left: downgoing Cerebellar: normal finger-to-nose, normal rapid alternating movements and normal heel-to-shin test Gait: unable to examine.        Laboratory Studies:  Basic Metabolic Panel:  Recent Labs Lab 02/15/15 1325  NA 138  K 4.3  CL 111  CO2 20*  GLUCOSE 152*  BUN 18  CREATININE 0.89  CALCIUM 9.0    Liver Function Tests: No results for input(s): AST, ALT, ALKPHOS, BILITOT, PROT, ALBUMIN in the last 168 hours. No results for input(s): LIPASE, AMYLASE in the last 168 hours. No results for input(s): AMMONIA in the last 168 hours.  CBC:  Recent Labs Lab 02/15/15 1325  WBC 4.2  HGB 11.9*  HCT 36.2  MCV 95.6  PLT 83*    Cardiac Enzymes: No results for input(s): CKTOTAL, CKMB, CKMBINDEX, TROPONINI in the last 168 hours.  BNP: Invalid input(s): POCBNP  CBG:  Recent Labs Lab 02/15/15 1633 02/15/15 2008 02/15/15 2349 02/16/15 0759 02/16/15 1139  GLUCAP 134* 134* 109* 120* 146*    Microbiology: Results for orders placed or performed in visit on 10/08/14  Culture, blood (single)     Status: None   Collection Time: 10/08/14 12:22 AM  Result Value Ref Range Status   Micro Text Report   Final       COMMENT                   NO GROWTH AEROBICALLY/ANAEROBICALLY IN 5 DAYS   ANTIBIOTIC                                                      Culture, blood (single)     Status: None   Collection Time: 10/08/14  1:35 AM  Result Value Ref Range Status   Micro Text Report   Final       COMMENT                   NO GROWTH AEROBICALLY/ANAEROBICALLY IN 5 DAYS   ANTIBIOTIC                                                        Coagulation Studies: No results for input(s): LABPROT, INR in the last 72 hours.  Urinalysis: No results for input(s): COLORURINE, LABSPEC, PHURINE, GLUCOSEU, HGBUR, BILIRUBINUR, KETONESUR, PROTEINUR, UROBILINOGEN, NITRITE, LEUKOCYTESUR in the last 168  hours.  Invalid input(s): APPERANCEUR  Lipid Panel:    Component Value Date/Time   CHOL 136 02/16/2015 0343   TRIG 117 02/16/2015 0343   HDL 48 02/16/2015 0343   CHOLHDL 2.8 02/16/2015 0343   VLDL 23 02/16/2015 0343   LDLCALC 65 02/16/2015 0343    HgbA1C:  Lab Results  Component Value Date   HGBA1C 6.7* 01/12/2015    Urine Drug Screen:      Component Value Date/Time  LABOPIA POSITIVE* 11/02/2007 0055   COCAINSCRNUR NONE DETECTED 11/02/2007 0055   LABBENZ NONE DETECTED 11/02/2007 0055   AMPHETMU NONE DETECTED 11/02/2007 [place] DETECTED 11/02/2007 0055   LABBARB  11/02/2007 0055    NONE DETECTED        DRUG SCREEN FOR MEDICAL PURPOSES ONLY.  IF CONFIRMATION IS NEEDED FOR ANY PURPOSE, NOTIFY LAB WITHIN 5 DAYS.    Alcohol Level: No results for input(s): ETH in the last 168 hours.   Imaging: Ct Head Wo Contrast  02/15/2015   ADDENDUM REPORT: 02/15/2015 14:03  ADDENDUM: Study discussed by telephone with Dr. Lennette Bihari PADUCHOWSKI on 02/15/2015 at 1356 hrs.   Electronically Signed   By: Genevie Ann M.D.   On: 02/15/2015 14:03   02/15/2015   CLINICAL DATA:  59 year old female code stroke. Right extremity weakness. Symptoms since 0830 hrs. Initial encounter.  EXAM: CT HEAD WITHOUT CONTRAST  TECHNIQUE: Contiguous axial images were obtained from the base of the skull through the vertex without intravenous contrast.  COMPARISON:  10/08/2014 and earlier.  FINDINGS: Visualized paranasal sinuses and mastoids are clear. No acute osseous abnormality identified. Visualized orbits and scalp soft tissues are within normal limits.  Calcified atherosclerosis at the skull base. Cerebral volume is within normal limits for age. No ventriculomegaly. No midline shift, mass effect, or evidence of intracranial mass lesion. No evidence of cortically based acute infarction identified. No suspicious intracranial vascular hyperdensity. Stable and normal gray-white matter differentiation. No acute intracranial  hemorrhage identified.  IMPRESSION: Stable and normal for age non contrast CT appearance of the brain.  Electronically Signed: By: Genevie Ann M.D. On: 02/15/2015 13:52   Mr Brain Wo Contrast  02/16/2015   CLINICAL DATA:  Right upper and right lower extremity weakness and numbness.  EXAM: MRI HEAD WITHOUT CONTRAST  TECHNIQUE: Multiplanar, multiecho pulse sequences of the brain and surrounding structures were obtained without intravenous contrast.  COMPARISON:  Head CT 02/15/2015  FINDINGS: There is no evidence of acute infarct, intracranial hemorrhage, mass, midline shift, or extra-axial fluid collection. Ventricles and sulci are normal. Small foci of T2 hyperintensity are noted in the bilateral cerebral white matter, predominantly in the frontal lobes and subcortical in location, nonspecific but compatible with minimal chronic small vessel ischemic disease.  Orbits are unremarkable. Paranasal sinuses are clear. There are trace bilateral pleural effusions. Major intracranial vascular flow voids are preserved.  IMPRESSION: 1. No acute intracranial abnormality. 2. Minimal chronic small vessel ischemic disease.   Electronically Signed   By: Logan Bores   On: 02/16/2015 09:29   US Carotid Bilateral  02/16/2015   CLINICAL DATA:  Stroke, visual disturbance.  Diabetes.  EXAM: BILATERAL CAROTID DUPLEX ULTRASOUND  TECHNIQUE: Pearline Cables scale imaging, color Doppler and duplex ultrasound was performed of bilateral carotid and vertebral arteries in the neck.  COMPARISON:  07/27/2009  REVIEW OF SYSTEMS: Quantification of carotid stenosis is based on velocity parameters that correlate the residual internal carotid diameter with NASCET-based stenosis levels, using the diameter of the distal internal carotid lumen as the denominator for stenosis measurement.  The following velocity measurements were obtained:  PEAK SYSTOLIC/END DIASTOLIC  RIGHT  ICA:                     58/14cm/sec  CCA:                     93/79KW/IOX  SYSTOLIC ICA/CCA  RATIO:  1.2  DIASTOLIC ICA/CCA RATIO: 1.3  ECA:  65cm/sec  LEFT  ICA:                     56/13cm/sec  CCA:                     09/73ZH/GDJ  SYSTOLIC ICA/CCA RATIO:  1.0  DIASTOLIC ICA/CCA RATIO: 1.0  ECA:                     100cm/sec  FINDINGS: RIGHT CAROTID ARTERY: Mild noncalcified plaque in the proximal ICA. No significant stenosis. Normal waveforms and color Doppler signal.  RIGHT VERTEBRAL ARTERY:  Normal flow direction and waveform.  LEFT CAROTID ARTERY: Eccentric partially calcified plaque in the carotid bulb and proximal ICA resulting in mild stenosis. Normal waveforms and color Doppler signal. No focal aliasing.  LEFT VERTEBRAL ARTERY: Normal flow direction and waveform.  IMPRESSION: 1. Mild bilateral proximal ICA plaque resulting in less than 50% diameter stenosis. The exam does not exclude plaque ulceration or embolization. Continued surveillance recommended.   Electronically Signed   By: Lucrezia Europe M.D.   On: 02/16/2015 11:26    Assessment: 59 y.o. female with past medical history of diabetes, hypothyroidism, chronic back pain, syncope, anxiety, presents to the emergency department for right upper and lower extremity weakness starting this morning. According to the patient she awoke feeling that her right arm was very heavy.  Pt is able to move her RUE and RLE but they are heavy.  RUE and RLE numbness. No facial weakness or speech abnormalities.    A lot of her RUE and RLE has giveaway weakness.  MRI no acute abnormality.  Explained that her weakness will improve.   Pt was not on ASA prior to admission.   Stroke Risk Factors - diabetes mellitus, family history and hypertension  Plan: - Con't antiplatelet therapy.  - Pt/ot -d/c planning.  - reinforcement stating her R side will improve.   Leotis Pain  02/16/2015, 12:15 PM

## 2015-02-16 NOTE — Progress Notes (Addendum)
Notified Dr. Lavetta Nielsen of patient BP of 182/67. No new orders.

## 2015-02-16 NOTE — Evaluation (Signed)
Clinical/Bedside Swallow Evaluation Patient Details  Name: [name] MRN: [id number] Date of Birth: 1956-09-07  Today's Date: 02/16/2015 Time: SLP Start Time (ACUTE ONLY): 0930 SLP Stop Time (ACUTE ONLY): 1030 SLP Time Calculation (min) (ACUTE ONLY): 60 min  Past Medical History:  Past Medical History  Diagnosis Date  . Diabetes mellitus   . Hypothyroid   . Degenerative disk disease   . Stomach ulcer   . Diverticulitis   . Syncope 01/2015  . Anxiety   . GERD (gastroesophageal reflux disease)   . History of hiatal hernia   . Cancer     HX OF CANCER OF UTERUS    Past Surgical History:  Past Surgical History  Procedure Laterality Date  . Hernia repair    . Abdominal hysterectomy    . Cholecystectomy     HPI:  pt admitted to the hospital w/ concern for CVA; MRI just completed. Pt initially had trouble swallowing during the NSG screen in the ED per report. Pt described s/s of swallowing difficulty that could be related to GI issues. Pt endorsed s/s of Esophageal dysmotility and reported Esophageal dilitation several years ago; noted dx of hiatal hernia.      Assessment / Plan / Recommendation Clinical Impression  Pt appears to safely tolerate trials of thin liquids and solids w/ no overt s/s of aspiration noted. Pt followed general aspiration precautions during po trials during the evaluation w/ no significant oral phase deficits noted; no coughing or choking noted. Pt fed self w/ min. setup assistance given. Pt reported no feelings of gagging (as she noted in the ED) or s/s of Reflux but did state she experiences such at home intermittently. She acknowledged Esophageal dilitation several years ago. Rec. pt f/u w/ GI post discharge for any further assessment and tx as indicated. Rec. a regular diet w/ thin liquids w/ meats cut well and moistened, general aspiration precautions. Meds in puree only if nec. for easier swallowing. ST will f/u for forther education as [employer].      Aspiration Risk   (reduced)    Diet Recommendation Dysphagia 3 (Mech soft);Age appropriate regular solids;Thin   Medication Administration: Whole meds with liquid Compensations: Slow rate;Small sips/bites    Other  Recommendations Recommended Consults: Consider GI evaluation Oral Care Recommendations: Oral care BID;Oral care before and after PO;Patient independent with oral care   Follow Up Recommendations       Frequency and Duration min 2x/week  1 week   Pertinent Vitals/Pain denied    SLP Swallow Goals     Swallow Study Prior Functional Status   lived at home prior    General Date of Onset: 02/15/15 Other Pertinent Information: pt admitted to the hospital w/ concern for CVA; MRI just completed. Pt initially had trouble swallowing during the NSG screen in the ED per report. Pt described s/s of swallowing difficulty that could be related to GI issues. Pt endorsed s/s of Esophageal dysmotility and reported Esophageal dilitation several years ago; noted dx of hiatal hernia.    Type of Study: Bedside swallow evaluation Previous Swallow Assessment: none indicated Diet Prior to this Study: Regular;Thin liquids Temperature Spikes Noted: No Respiratory Status: Room air History of Recent Intubation: No Behavior/Cognition: Alert;Cooperative;Pleasant mood Oral Cavity - Dentition: Edentulous (baseline) Self-Feeding Abilities: Able to feed self Patient Positioning: Upright in bed Baseline Vocal Quality: Normal Volitional Cough: Strong Volitional Swallow: Able to elicit    Oral/Motor/Sensory Function Overall Oral Motor/Sensory Function: Appears within functional limits for tasks assessed Labial  ROM: Within Functional Limits Labial Symmetry: Within Functional Limits (grossly) Labial Strength: Within Functional Limits Labial Sensation: Within Functional Limits Lingual ROM: Within Functional Limits Lingual Symmetry: Within Functional Limits Lingual Strength: Within Functional  Limits (grossly) Lingual Sensation: Within Functional Limits Facial Symmetry: Within Functional Limits Velum: Within Functional Limits Mandible: Within Functional Limits   Ice Chips Ice chips: Within functional limits Presentation: Spoon (x3)   Thin Liquid Thin Liquid: Within functional limits Presentation: Cup;Self Fed (x5 trials; then sips later )    Nectar Thick Nectar Thick Liquid: Not tested   Honey Thick Honey Thick Liquid: Not tested   Puree Puree: Within functional limits Presentation: Self Fed;Spoon (x8+ trials)   Solid   GO    Solid: Within functional limits Presentation: Self Fed (x3 trials)       [name] 02/16/2015,1:47 PM

## 2015-02-16 NOTE — Progress Notes (Signed)
[place] [facility] [place] NAME: [name]    MR#:  [id number]  DATE OF BIRTH:  02-13-1956  SUBJECTIVE:  CHIEF COMPLAINT:   Chief Complaint  Patient presents with  . Code Stroke   No stroke on MRI. She still has right arm and leg weakness and numbness. She feels nauseated and weak.  REVIEW OF SYSTEMS:   Review of Systems  Constitutional: Negative for fever.  Respiratory: Negative for shortness of breath.   Cardiovascular: Negative for chest pain and palpitations.  Gastrointestinal: Positive for nausea. Negative for vomiting, abdominal pain, diarrhea, constipation and blood in stool.  Genitourinary: Negative for dysuria.  Neurological: Positive for sensory change and focal weakness. Negative for speech change.    DRUG ALLERGIES:   Allergies  Allergen Reactions  . Erythromycin Base Other (See Comments)    Fever  . Metformin     REACTION: Loose stools  . Pioglitazone Hcl-Metformin Hcl     REACTION: Diarrhea  . Rosiglitazone Maleate     REACTION: Swelling  . Sitagliptin-Metformin Hcl     REACTION: Rash  . Codeine Sulfate Rash  . Tetanus-Diphtheria Toxoids Td Rash and Other (See Comments)    Fever    VITALS:  Blood pressure 153/68, pulse 76, temperature 98.5 F (36.9 C), temperature source Oral, resp. rate 19, height 5' 3"  (1.6 m), weight 67.813 kg (149 lb 8 oz), SpO2 97 %.  PHYSICAL EXAMINATION:  GENERAL:  59 y.o.-year-old patient lying in the bed with no acute distress.  EYES: Pupils equal, round, reactive to light and accommodation. No scleral icterus. Extraocular muscles intact.  HEENT: Head atraumatic, normocephalic. Oropharynx and nasopharynx clear.  NECK:  Supple, no jugular venous distention. No thyroid enlargement, no tenderness.  LUNGS: Normal breath sounds bilaterally, no wheezing, rales,rhonchi or crepitation. No use of accessory muscles of respiration.  CARDIOVASCULAR: S1, S2 normal. No murmurs, rubs, or  gallops.  ABDOMEN: Soft, nontender, nondistended. Bowel sounds present. No organomegaly or mass.  EXTREMITIES: No pedal edema, cyanosis, or clubbing.  NEUROLOGIC: Cranial nerves II through XII are intact. Muscle strength 4 out of 5 in the right arm and leg, 5 out of 5 on the left. Reflexes are normal  PSYCHIATRIC: The patient is alert and oriented x 3. Her affect is flat SKIN: No obvious rash, lesion, or ulcer.    LABORATORY PANEL:   CBC  Recent Labs Lab 02/15/15 1325  WBC 4.2  HGB 11.9*  HCT 36.2  PLT 83*   ------------------------------------------------------------------------------------------------------------------  Chemistries   Recent Labs Lab 02/15/15 1325  NA 138  K 4.3  CL 111  CO2 20*  GLUCOSE 152*  BUN 18  CREATININE 0.89  CALCIUM 9.0   ------------------------------------------------------------------------------------------------------------------  Cardiac Enzymes No results for input(s): TROPONINI in the last 168 hours. ------------------------------------------------------------------------------------------------------------------  RADIOLOGY:  Ct Head Wo Contrast  02/15/2015   ADDENDUM REPORT: 02/15/2015 14:03  ADDENDUM: Study discussed by telephone with Dr. Lennette Bihari PADUCHOWSKI on 02/15/2015 at 1356 hrs.   Electronically Signed   By: Genevie Ann M.D.   On: 02/15/2015 14:03   02/15/2015   CLINICAL DATA:  59 year old female code stroke. Right extremity weakness. Symptoms since 0830 hrs. Initial encounter.  EXAM: CT HEAD WITHOUT CONTRAST  TECHNIQUE: Contiguous axial images were obtained from the base of the skull through the vertex without intravenous contrast.  COMPARISON:  10/08/2014 and earlier.  FINDINGS: Visualized paranasal sinuses and mastoids are clear. No acute osseous abnormality identified. Visualized orbits and scalp soft tissues  are within normal limits.  Calcified atherosclerosis at the skull base. Cerebral volume is within normal limits for age. No  ventriculomegaly. No midline shift, mass effect, or evidence of intracranial mass lesion. No evidence of cortically based acute infarction identified. No suspicious intracranial vascular hyperdensity. Stable and normal gray-white matter differentiation. No acute intracranial hemorrhage identified.  IMPRESSION: Stable and normal for age non contrast CT appearance of the brain.  Electronically Signed: By: Genevie Ann M.D. On: 02/15/2015 13:52   Mr Brain Wo Contrast  02/16/2015   CLINICAL DATA:  Right upper and right lower extremity weakness and numbness.  EXAM: MRI HEAD WITHOUT CONTRAST  TECHNIQUE: Multiplanar, multiecho pulse sequences of the brain and surrounding structures were obtained without intravenous contrast.  COMPARISON:  Head CT 02/15/2015  FINDINGS: There is no evidence of acute infarct, intracranial hemorrhage, mass, midline shift, or extra-axial fluid collection. Ventricles and sulci are normal. Small foci of T2 hyperintensity are noted in the bilateral cerebral white matter, predominantly in the frontal lobes and subcortical in location, nonspecific but compatible with minimal chronic small vessel ischemic disease.  Orbits are unremarkable. Paranasal sinuses are clear. There are trace bilateral pleural effusions. Major intracranial vascular flow voids are preserved.  IMPRESSION: 1. No acute intracranial abnormality. 2. Minimal chronic small vessel ischemic disease.   Electronically Signed   By: Logan Bores   On: 02/16/2015 09:29   US Carotid Bilateral  02/16/2015   CLINICAL DATA:  Stroke, visual disturbance.  Diabetes.  EXAM: BILATERAL CAROTID DUPLEX ULTRASOUND  TECHNIQUE: Pearline Cables scale imaging, color Doppler and duplex ultrasound was performed of bilateral carotid and vertebral arteries in the neck.  COMPARISON:  07/27/2009  REVIEW OF SYSTEMS: Quantification of carotid stenosis is based on velocity parameters that correlate the residual internal carotid diameter with NASCET-based stenosis levels, using  the diameter of the distal internal carotid lumen as the denominator for stenosis measurement.  The following velocity measurements were obtained:  PEAK SYSTOLIC/END DIASTOLIC  RIGHT  ICA:                     58/14cm/sec  CCA:                     02/58NI/DPO  SYSTOLIC ICA/CCA RATIO:  1.2  DIASTOLIC ICA/CCA RATIO: 1.3  ECA:                     65cm/sec  LEFT  ICA:                     56/13cm/sec  CCA:                     24/23NT/IRW  SYSTOLIC ICA/CCA RATIO:  1.0  DIASTOLIC ICA/CCA RATIO: 1.0  ECA:                     100cm/sec  FINDINGS: RIGHT CAROTID ARTERY: Mild noncalcified plaque in the proximal ICA. No significant stenosis. Normal waveforms and color Doppler signal.  RIGHT VERTEBRAL ARTERY:  Normal flow direction and waveform.  LEFT CAROTID ARTERY: Eccentric partially calcified plaque in the carotid bulb and proximal ICA resulting in mild stenosis. Normal waveforms and color Doppler signal. No focal aliasing.  LEFT VERTEBRAL ARTERY: Normal flow direction and waveform.  IMPRESSION: 1. Mild bilateral proximal ICA plaque resulting in less than 50% diameter stenosis. The exam does not exclude plaque ulceration or embolization. Continued surveillance recommended.   Electronically Signed  By: Lucrezia Europe M.D.   On: 02/16/2015 11:26    EKG:   Orders placed or performed during the hospital encounter of 02/15/15  . ED EKG  . ED EKG  . EKG 12-Lead  . EKG 12-Lead    ASSESSMENT AND PLAN:   Active Problems:   CVA (cerebral infarction)  #1 right-sided weakness: No stroke on CT or MRI. She has been seen by neurology. Physical and occupational therapy consultations are pending. Speech therapy has cleared her for regular diet. Continue antiplatelets therapy with aspirin..  #2 hypertension: Blood pressure is very elevated today at 200/80. One dose hydralazine given bringing her blood pressure down to 150/60. She reports no history of hypertension in the past. She seems very anxious. Will continue with  hydralazine as needed. May need to start an out patient antihypertensives such as hydrochlorothiazide.  #3 nausea: Possibly due to hydralazine or chronic opiates. Also hypertension may be contributing. No vomiting  #4 diabetes: A1c 6.7. Continue sliding scale  #5 hypothyroidism: Continue levothyroxine  #6 chronic back pain: Continue home medications. No exacerbation at this time  #7 GERD continue Protonix   All the records are reviewed and case discussed with Care Management/Social Workerr. Management plans discussed with the patient, family and they are in agreement.  CODE STATUS: DO NOT RESUSCITATE  TOTAL TIME TAKING CARE OF THIS PATIENT: 35 minutes.   POSSIBLE D/C IN 1 DAYS, DEPENDING ON CLINICAL CONDITION.   Myrtis Ser M.D on 02/16/2015 at 2:31 PM  Between 7am to 6pm - Pager - [phone]  After 6pm go to [email or web address] - password [email or web address] [facility]  [facility]  Office  [phone]  CC: Primary care physician; Alvester Chou, NP

## 2015-02-16 NOTE — Plan of Care (Signed)
Problem: SLP Dysphagia Goals Goal: Misc Dysphagia Goal Pt will safely tolerate po diet of least restrictive consistency w/ no overt s/s of aspiration noted by Staff/pt/family x2 sessions.

## 2015-02-16 NOTE — Progress Notes (Signed)
*  PRELIMINARY RESULTS* Echocardiogram 2D Echocardiogram has been performed.  [name] 02/16/2015, 1:32 PM

## 2015-02-17 ENCOUNTER — Encounter: Payer: Self-pay | Admitting: Internal Medicine

## 2015-02-17 LAB — CBC
HCT: 41.1 % (ref 35.0–47.0)
Hemoglobin: 13.5 g/dL (ref 12.0–16.0)
MCH: 31.2 pg (ref 26.0–34.0)
MCHC: 32.8 g/dL (ref 32.0–36.0)
MCV: 95 fL (ref 80.0–100.0)
PLATELETS: 78 10*3/uL — AB (ref 150–440)
RBC: 4.32 MIL/uL (ref 3.80–5.20)
RDW: 14 % (ref 11.5–14.5)
WBC: 5 10*3/uL (ref 3.6–11.0)

## 2015-02-17 LAB — BASIC METABOLIC PANEL
Anion gap: 8 (ref 5–15)
BUN: 13 mg/dL (ref 6–20)
CO2: 27 mmol/L (ref 22–32)
Calcium: 8.3 mg/dL — ABNORMAL LOW (ref 8.9–10.3)
Chloride: 103 mmol/L (ref 101–111)
Creatinine, Ser: 0.8 mg/dL (ref 0.44–1.00)
GFR calc Af Amer: >60 mL/min (ref >60)
GFR calc non Af Amer: >60 mL/min (ref >60)
GLUCOSE: 236 mg/dL — AB (ref 65–99)
POTASSIUM: 3.4 mmol/L — AB (ref 3.5–5.1)
SODIUM: 138 mmol/L (ref 135–145)

## 2015-02-17 LAB — GLUCOSE, CAPILLARY: GLUCOSE-CAPILLARY: 198 mg/dL — AB (ref 65–99)

## 2015-02-17 MED ORDER — ATORVASTATIN CALCIUM 40 MG PO TABS: 40.0000 mg | ORAL_TABLET | Freq: Every day | ORAL | Status: DC

## 2015-02-17 NOTE — Evaluation (Addendum)
Physical Therapy Evaluation Patient Details Name: [name] MRN: [id number] DOB: 1956-07-31 Today's Date: 02/17/2015   History of Present Illness  presented to ER seconadry to R UE/LE weakness; admitted for acute CVA work-up.  Head CT and MRI negative for acute abnormality.  Clinical Impression  Upon evaluation, patient alert and oriented; follows all commands and demonstrates good insight/safety awareness.  Does demonstrate mild weakness and sensory deficit R UE > LE as noted below.  Able to complete bed mobility indep; sit/stand, basic transfers and gait (125') with RW, cga for safety.  Limited standing functional reach (4") indicative of higher-level balance deficits.  Recommend continued use of RW for optimal safety/indep at this time.  Patient voiced awareness/understanding. Did not positive orthostatics during session (drop from bP 157/81, HR 96 in sitting to BP 93/56, HR 126 in standing), minimally symptomatic.  RN/MD informed/aware. Would benefit from skilled PT to address above deficits and promote optimal return to PLOF; Recommend transition to [facility] [facility] upon discharge from acute hospitalization.     Follow Up Recommendations Home health PT    Equipment Recommendations       Recommendations for Other Services       Precautions / Restrictions Precautions Precautions: Fall Restrictions Weight Bearing Restrictions: No      Mobility  Bed Mobility Overal bed mobility: Independent                Transfers Overall transfer level: Needs assistance Equipment used: Rolling walker (2 wheeled) Transfers: Sit to/from Stand Sit to Stand: Supervision;Min guard            Ambulation/Gait Ambulation/Gait assistance: Min guard Ambulation Distance (Feet): 125 Feet Assistive device: Rolling walker (2 wheeled)     Gait velocity interpretation: <1.8 ft/sec, indicative of risk for recurrent falls (1.0 ft/sec) General Gait Details: reciprocal stepping pattern with  decreased step height/length bilat; very slow, somewhat guarded gait performance.  No overt LOB.  Reports noted fatigue with distance, but no subjective orthostasis.  Stairs            Wheelchair Mobility    Modified Rankin (Stroke Patients Only)       Balance Overall balance assessment: Needs assistance Sitting-balance support: No upper extremity supported;Feet supported Sitting balance-Leahy Scale: Normal     Standing balance support: Bilateral upper extremity supported Standing balance-Leahy Scale: Fair Standing balance comment: forward functional reach approx 4"                             Pertinent Vitals/Pain Pain Assessment: No/denies pain    Home Living Family/patient expects to be discharged to:: Private residence Living Arrangements: Spouse/significant other;Children Available Help at Discharge: Family Type of Home: Mobile home Home Access: Ramped entrance     Home Layout: One level Home Equipment: Environmental consultant - 2 wheels;Cane - single point;Shower seat      Prior Function Level of Independence: Independent with assistive device(s)         Comments: Mod indep with SPC for household mobility, RW for community distances; + driving.  1-2 falls in previous six months.     Hand Dominance   Dominant Hand: Right    Extremity/Trunk Assessment   Upper Extremity Assessment: Generalized weakness (R UE 4-/5 with mild decrease in speed of activation.  Mild sensory deficit in distal R UE--diminished light touch, + extinction.)           Lower Extremity Assessment: Overall WFL for tasks assessed (globally 4+/5  throughout bilat LEs, symmetrical; denies sensory deficit in R LE)         Communication   Communication: No difficulties  Cognition Arousal/Alertness: Awake/alert Behavior During Therapy: WFL for tasks assessed/performed Overall Cognitive Status: Within Functional Limits for tasks assessed                      General  Comments      Exercises Other Exercises Other Exercises: Educated on techniques to manage orthostasis: pauses with position change, intermittent rest periods as needed; educated in car transfer technqiue.  Encouraged use of RW for all mobility until she feels strength fully returned. PAtient voiced understanding of all information.  (8 minutes)      Assessment/Plan    PT Assessment Patient needs continued PT services  PT Diagnosis Generalized weakness;Abnormality of gait   PT Problem List Decreased strength;Decreased balance;Decreased activity tolerance;Decreased mobility;Decreased knowledge of use of DME;Decreased safety awareness;Decreased knowledge of precautions  PT Treatment Interventions DME instruction;Gait training;Stair training;Functional mobility training;Therapeutic activities;Therapeutic exercise;Balance training;Neuromuscular re-education;Patient/family education   PT Goals (Current goals can be found in the Care Plan section) Acute Rehab PT Goals Patient Stated Goal: "to move around" PT Goal Formulation: With patient Time For Goal Achievement: 03/03/15 Potential to Achieve Goals: Good    Frequency Min 2X/week   Barriers to discharge Decreased caregiver support      Co-evaluation               End of Session Equipment Utilized During Treatment: Gait belt Activity Tolerance: Patient tolerated treatment well Patient left: in bed;with call bell/phone within reach;with bed alarm set Nurse Communication: Mobility status         Time: 2440-1027 PT Time Calculation (min) (ACUTE ONLY): 20 min   Charges:   PT Evaluation $Initial PT Evaluation Tier I: 1 Procedure PT Treatments $Therapeutic Activity: 8-22 mins   PT G Codes:       Patryck Kilgore H. Owens Shark, PT, DPT 02/17/2015, 10:53 AM [phone]  Addendum: discharge recommendations corrected and updated to HHPT (mis-entered as STR on initial note). Brigham Cobbins H. Owens Shark, PT, DPT 02/17/2015, 11:07  AM [phone]

## 2015-02-17 NOTE — Care Management (Signed)
Patient for discharge home with home health nursing to monitor vitals signs and orthostasis and physical therapy. Referral to [facility]

## 2015-02-17 NOTE — Discharge Instructions (Signed)
You must sit at a stationary position for 5 minutes and do leg extension exercises as taught for 5 minutes before you start walking from a resting position or after getting up from a bed, once you stand up , stand at that spot for 3-5 minutes while holding on to a wall-bed-heavy furniture and then walk only if you are not dizzy, using a  walker at all times, if you still get dizzy sit down, and call for help.

## 2015-02-17 NOTE — Progress Notes (Signed)
Inpatient Diabetes Program Recommendations  AACE/ADA: New Consensus Statement on Inpatient Glycemic Control (2013)  Target Ranges:  Prepandial:   less than 140 mg/dL      Peak postprandial:   less than 180 mg/dL (1-2 hours)      Critically ill patients:  140 - 180 mg/dL   Results for [name], [name] (MRN [id number]) as of 02/17/2015 07:43  Ref. Range 02/16/2015 07:59 02/16/2015 11:39 02/16/2015 16:20 02/16/2015 20:10 02/17/2015 07:21  Glucose-Capillary Latest Ref Range: 65-99 mg/dL 120 (H) 146 (H) 162 (H) 255 (H) 198 (H)    Reason for assessment: elevated CBG  Diabetes history: Type 2 Outpatient Diabetes medications: Januvia 121m/day Current orders for Inpatient glycemic control: Tradgenta 578mday, Novolog 0-72m74mid  Elevated blood sugars last evening and this morning- most recent A1C is 7.8% on 02/16/15.  Please consider adding Lantus 7 units q day starting this morning or add Novolog hs correction scale  0-5 units qhs.   JulGentry FitzN, BA, MHA, CDE Diabetes Coordinator Inpatient Diabetes Program  [phone] Pager) [phone]/05/2015 7:47 AM

## 2015-02-17 NOTE — Discharge Summary (Signed)
[facility] [place] NAME: [name]    MR#:  [id number]  DATE OF BIRTH:  January 03, 1956  DATE OF ADMISSION:  02/15/2015 ADMITTING PHYSICIAN: Dustin Flock, MD  DATE OF DISCHARGE: 02/17/2015 11:33 AM  PRIMARY CARE PHYSICIAN: Alvester Chou, NP    ADMISSION DIAGNOSIS:  CVA (cerebral infarction) [I63.9] Cerebral infarction due to unspecified mechanism [I63.9]  DISCHARGE DIAGNOSIS:  Active Problems:   CVA (cerebral infarction)   SECONDARY DIAGNOSIS:   Past Medical History  Diagnosis Date  . Diabetes mellitus   . Hypothyroid   . Degenerative disk disease   . Stomach ulcer   . Diverticulitis   . Syncope 01/2015  . Anxiety   . GERD (gastroesophageal reflux disease)   . History of hiatal hernia   . Cancer     HX OF CANCER OF UTERUS   . TIA (transient ischemic attack)     HOSPITAL COURSE:  This is 59 year old female with past medical history significant for diabetes, hypothyroidism and chronic back pain who presented to the emergency department for right upper and lower extremity weakness. For further details please refer the H&P.  1. TIA: Patient's MRI was negative for stroke. Her symptoms were consistent with a TIA. Neurology was consulted. At this time patient will continue on aspirin and statin. Patient will be discharged with home health. She underwent an echocardiogram and carotid Doppler. Her echocardiogram showed no significant abnormalities as did her carotid ultrasound.  2. Type 2 diabetes: Patient will continue outpatient medications.  3. Hypothyroidism.: Patient will continue Synthroid  4. Chronic back pain: Patient is on hydrocodone at home which she will continue.   DISCHARGE CONDITIONS AND DIET:  Home with home health Heart healthy diet and diabetic diet  CONSULTS OBTAINED:  Treatment Team:  Leotis Pain, MD  DRUG ALLERGIES:   Allergies  Allergen Reactions  . Erythromycin Base Other (See Comments)     Fever  . Metformin     REACTION: Loose stools  . Pioglitazone Hcl-Metformin Hcl     REACTION: Diarrhea  . Rosiglitazone Maleate     REACTION: Swelling  . Sitagliptin-Metformin Hcl     REACTION: Rash  . Codeine Sulfate Rash  . Tetanus-Diphtheria Toxoids Td Rash and Other (See Comments)    Fever    DISCHARGE MEDICATIONS:   Discharge Medication List as of 02/17/2015 11:10 AM    START taking these medications   Details  atorvastatin (LIPITOR) 40 MG tablet Take 1 tablet (40 mg total) by mouth daily at 6 PM., Starting 02/17/2015, Until Discontinued, Normal      CONTINUE these medications which have NOT CHANGED   Details  HYDROcodone-acetaminophen (NORCO/VICODIN) 5-325 MG per tablet Take 1 tablet by mouth every 6 (six) hours as needed for moderate pain., Until Discontinued, Historical Med    JANUVIA 100 MG tablet Take 100 mg by mouth daily., Starting 12/15/2014, Until Discontinued, Historical Med    levothyroxine (SYNTHROID, LEVOTHROID) 125 MCG tablet Take 125 mcg by mouth daily.  , Until Discontinued, Historical Med    morphine (MS CONTIN) 100 MG 12 hr tablet Take 100 mg by mouth every 12 (twelve) hours., Until Discontinued, Historical Med    omeprazole (PRILOSEC) 20 MG capsule Take 20 mg by mouth daily., Starting 10/08/2014, Until Discontinued, Historical Med    pantoprazole (PROTONIX) 20 MG tablet Take 20 mg by mouth daily., Starting 11/10/2014, Until Discontinued, Historical Med    tiZANidine (ZANAFLEX) 4 MG tablet Take 4 mg by mouth daily  as needed for muscle spasms. , Starting 12/14/2014, Until Discontinued, Historical Med    zolpidem (AMBIEN) 5 MG tablet Take 5 mg by mouth daily as needed for sleep. , Starting 12/14/2014, Until Discontinued, Historical Med              Today   CHIEF COMPLAINT:  Patient is doing well today. Patient ambulated with physical therapy and she was noted to have orthostasis.   VITAL SIGNS:  Blood pressure 148/82, pulse 96, temperature 98.4 F  (36.9 C), temperature source Oral, resp. rate 19, height 5' 3"  (1.6 m), weight 67.813 kg (149 lb 8 oz), SpO2 98 %.   REVIEW OF SYSTEMS:  Review of Systems  Constitutional: Negative for chills and malaise/fatigue.  Eyes: Negative for blurred vision and double vision.  Cardiovascular: Negative for palpitations and orthopnea.  Gastrointestinal: Negative for heartburn, nausea, vomiting and abdominal pain.  Genitourinary: Negative for dysuria.  Neurological: Positive for sensory change. Negative for tremors, speech change, focal weakness, seizures and headaches.     PHYSICAL EXAMINATION:  GENERAL:  59 y.o.-year-old patient lying in the bed with no acute distress.  NECK:  Supple, no jugular venous distention. No thyroid enlargement, no tenderness.  LUNGS: Normal breath sounds bilaterally, no wheezing, rales,rhonchi  No use of accessory muscles of respiration.  CARDIOVASCULAR: S1, S2 normal. No murmurs, rubs, or gallops.  ABDOMEN: Soft, non-tender, non-distended. Bowel sounds present. No organomegaly or mass.  EXTREMITIES: No pedal edema, cyanosis, or clubbing.  PSYCHIATRIC: The patient is alert and oriented x 3.  SKIN: No obvious rash, lesion, or ulcer.  Neuro: Patient has some very minimal weakness in the right upper extremity. Sensation is intact. She has no facial droop. Her speech is appropriate. No cerebellar signs. DATA REVIEW:   CBC  Recent Labs Lab 02/17/15 0343  WBC 5.0  HGB 13.5  HCT 41.1  PLT 78*    Chemistries   Recent Labs Lab 02/17/15 0343  NA 138  K 3.4*  CL 103  CO2 27  GLUCOSE 236*  BUN 13  CREATININE 0.80  CALCIUM 8.3*    Cardiac Enzymes No results for input(s): TROPONINI in the last 168 hours.  Microbiology Results  @MICRORSLT48 @  RADIOLOGY:  Ct Head Wo Contrast  02/15/2015   ADDENDUM REPORT: 02/15/2015 14:03  ADDENDUM: Study discussed by telephone with Dr. Lennette Bihari PADUCHOWSKI on 02/15/2015 at 1356 hrs.   Electronically Signed   By: Genevie Ann M.D.    On: 02/15/2015 14:03   02/15/2015   CLINICAL DATA:  59 year old female code stroke. Right extremity weakness. Symptoms since 0830 hrs. Initial encounter.  EXAM: CT HEAD WITHOUT CONTRAST  TECHNIQUE: Contiguous axial images were obtained from the base of the skull through the vertex without intravenous contrast.  COMPARISON:  10/08/2014 and earlier.  FINDINGS: Visualized paranasal sinuses and mastoids are clear. No acute osseous abnormality identified. Visualized orbits and scalp soft tissues are within normal limits.  Calcified atherosclerosis at the skull base. Cerebral volume is within normal limits for age. No ventriculomegaly. No midline shift, mass effect, or evidence of intracranial mass lesion. No evidence of cortically based acute infarction identified. No suspicious intracranial vascular hyperdensity. Stable and normal gray-white matter differentiation. No acute intracranial hemorrhage identified.  IMPRESSION: Stable and normal for age non contrast CT appearance of the brain.  Electronically Signed: By: Genevie Ann M.D. On: 02/15/2015 13:52   Mr Brain Wo Contrast  02/16/2015   CLINICAL DATA:  Right upper and right lower extremity weakness and numbness.  EXAM: MRI HEAD WITHOUT CONTRAST  TECHNIQUE: Multiplanar, multiecho pulse sequences of the brain and surrounding structures were obtained without intravenous contrast.  COMPARISON:  Head CT 02/15/2015  FINDINGS: There is no evidence of acute infarct, intracranial hemorrhage, mass, midline shift, or extra-axial fluid collection. Ventricles and sulci are normal. Small foci of T2 hyperintensity are noted in the bilateral cerebral white matter, predominantly in the frontal lobes and subcortical in location, nonspecific but compatible with minimal chronic small vessel ischemic disease.  Orbits are unremarkable. Paranasal sinuses are clear. There are trace bilateral pleural effusions. Major intracranial vascular flow voids are preserved.  IMPRESSION: 1. No acute  intracranial abnormality. 2. Minimal chronic small vessel ischemic disease.   Electronically Signed   By: Logan Bores   On: 02/16/2015 09:29   US Carotid Bilateral  02/16/2015   CLINICAL DATA:  Stroke, visual disturbance.  Diabetes.  EXAM: BILATERAL CAROTID DUPLEX ULTRASOUND  TECHNIQUE: Pearline Cables scale imaging, color Doppler and duplex ultrasound was performed of bilateral carotid and vertebral arteries in the neck.  COMPARISON:  07/27/2009  REVIEW OF SYSTEMS: Quantification of carotid stenosis is based on velocity parameters that correlate the residual internal carotid diameter with NASCET-based stenosis levels, using the diameter of the distal internal carotid lumen as the denominator for stenosis measurement.  The following velocity measurements were obtained:  PEAK SYSTOLIC/END DIASTOLIC  RIGHT  ICA:                     58/14cm/sec  CCA:                     53/64WO/EHO  SYSTOLIC ICA/CCA RATIO:  1.2  DIASTOLIC ICA/CCA RATIO: 1.3  ECA:                     65cm/sec  LEFT  ICA:                     56/13cm/sec  CCA:                     12/24MG/NOI  SYSTOLIC ICA/CCA RATIO:  1.0  DIASTOLIC ICA/CCA RATIO: 1.0  ECA:                     100cm/sec  FINDINGS: RIGHT CAROTID ARTERY: Mild noncalcified plaque in the proximal ICA. No significant stenosis. Normal waveforms and color Doppler signal.  RIGHT VERTEBRAL ARTERY:  Normal flow direction and waveform.  LEFT CAROTID ARTERY: Eccentric partially calcified plaque in the carotid bulb and proximal ICA resulting in mild stenosis. Normal waveforms and color Doppler signal. No focal aliasing.  LEFT VERTEBRAL ARTERY: Normal flow direction and waveform.  IMPRESSION: 1. Mild bilateral proximal ICA plaque resulting in less than 50% diameter stenosis. The exam does not exclude plaque ulceration or embolization. Continued surveillance recommended.   Electronically Signed   By: Lucrezia Europe M.D.   On: 02/16/2015 11:26      Management plans discussed with the patient and she is in  agreement. Stable for discharge home with home health  Patient should follow up with PCP in one week  CODE STATUS:     Code Status Orders        Start     Ordered   02/15/15 1652  Do not attempt resuscitation (DNR)   Continuous    Question Answer Comment  In the event of cardiac or respiratory ARREST Do not call a "code blue"  In the event of cardiac or respiratory ARREST Do not perform Intubation, CPR, defibrillation or ACLS   In the event of cardiac or respiratory ARREST Use medication by any route, position, wound care, and other measures to relive pain and suffering. May use oxygen, suction and manual treatment of airway obstruction as needed for comfort.      02/15/15 1651    Advance Directive Documentation        Most Recent Value   Type of Advance Directive  Healthcare Power of Attorney, Living will   Pre-existing out of facility DNR order (yellow form or pink MOST form)     "MOST" Form in Place?        TOTAL TIME TAKING CARE OF THIS PATIENT: 35 minutes.    Cuthbert Turton M.D on 02/17/2015 at 12:34 PM  Between 7am to 6pm - Pager - [phone] After 6pm go to [email or web address] - password [email or web address] [facility]  [facility]  Office  [phone]  CC: Primary care physician; Alvester Chou, NP

## 2015-03-11 ENCOUNTER — Emergency Department: Payer: Medicare HMO

## 2015-03-11 ENCOUNTER — Inpatient Hospital Stay
Admission: EM | Admit: 2015-03-11 | Discharge: 2015-03-14 | DRG: 312 | Disposition: A | Discharge status: Home / Self Care | Payer: Medicare HMO | Attending: Specialist | Admitting: Specialist

## 2015-03-11 DIAGNOSIS — E860 Dehydration: Secondary | ICD-10-CM | POA: Diagnosis present

## 2015-03-11 DIAGNOSIS — Z8542 Personal history of malignant neoplasm of other parts of uterus: Secondary | ICD-10-CM

## 2015-03-11 DIAGNOSIS — Z79891 Long term (current) use of opiate analgesic: Secondary | ICD-10-CM | POA: Diagnosis not present

## 2015-03-11 DIAGNOSIS — Z8673 Personal history of transient ischemic attack (TIA), and cerebral infarction without residual deficits: Secondary | ICD-10-CM | POA: Diagnosis not present

## 2015-03-11 DIAGNOSIS — M1990 Unspecified osteoarthritis, unspecified site: Secondary | ICD-10-CM | POA: Diagnosis present

## 2015-03-11 DIAGNOSIS — I951 Orthostatic hypotension: Secondary | ICD-10-CM | POA: Diagnosis not present

## 2015-03-11 DIAGNOSIS — Z8711 Personal history of peptic ulcer disease: Secondary | ICD-10-CM | POA: Diagnosis not present

## 2015-03-11 DIAGNOSIS — Z90710 Acquired absence of both cervix and uterus: Secondary | ICD-10-CM

## 2015-03-11 DIAGNOSIS — Z8249 Family history of ischemic heart disease and other diseases of the circulatory system: Secondary | ICD-10-CM

## 2015-03-11 DIAGNOSIS — E039 Hypothyroidism, unspecified: Secondary | ICD-10-CM | POA: Diagnosis present

## 2015-03-11 DIAGNOSIS — Z887 Allergy status to serum and vaccine status: Secondary | ICD-10-CM

## 2015-03-11 DIAGNOSIS — E119 Type 2 diabetes mellitus without complications: Secondary | ICD-10-CM | POA: Diagnosis present

## 2015-03-11 DIAGNOSIS — E785 Hyperlipidemia, unspecified: Secondary | ICD-10-CM | POA: Diagnosis present

## 2015-03-11 DIAGNOSIS — K219 Gastro-esophageal reflux disease without esophagitis: Secondary | ICD-10-CM | POA: Diagnosis present

## 2015-03-11 DIAGNOSIS — Z9049 Acquired absence of other specified parts of digestive tract: Secondary | ICD-10-CM | POA: Diagnosis present

## 2015-03-11 DIAGNOSIS — M549 Dorsalgia, unspecified: Secondary | ICD-10-CM | POA: Diagnosis present

## 2015-03-11 DIAGNOSIS — Z888 Allergy status to other drugs, medicaments and biological substances status: Secondary | ICD-10-CM

## 2015-03-11 DIAGNOSIS — I10 Essential (primary) hypertension: Secondary | ICD-10-CM | POA: Diagnosis present

## 2015-03-11 DIAGNOSIS — R55 Syncope and collapse: Secondary | ICD-10-CM | POA: Diagnosis present

## 2015-03-11 DIAGNOSIS — R001 Bradycardia, unspecified: Secondary | ICD-10-CM

## 2015-03-11 DIAGNOSIS — I480 Paroxysmal atrial fibrillation: Secondary | ICD-10-CM

## 2015-03-11 DIAGNOSIS — G8929 Other chronic pain: Secondary | ICD-10-CM | POA: Diagnosis present

## 2015-03-11 DIAGNOSIS — Z79899 Other long term (current) drug therapy: Secondary | ICD-10-CM

## 2015-03-11 DIAGNOSIS — D696 Thrombocytopenia, unspecified: Secondary | ICD-10-CM | POA: Diagnosis present

## 2015-03-11 HISTORY — DX: Paroxysmal atrial fibrillation: I48.0

## 2015-03-11 LAB — COMPREHENSIVE METABOLIC PANEL
ALT: 39 U/L (ref 14–54)
AST: 41 U/L (ref 15–41)
Albumin: 4 g/dL (ref 3.5–5.0)
Alkaline Phosphatase: 101 U/L (ref 38–126)
Anion gap: 9 (ref 5–15)
BUN: 21 mg/dL — ABNORMAL HIGH (ref 6–20)
CALCIUM: 9.5 mg/dL (ref 8.9–10.3)
CO2: 25 mmol/L (ref 22–32)
Chloride: 100 mmol/L — ABNORMAL LOW (ref 101–111)
Creatinine, Ser: 1 mg/dL (ref 0.44–1.00)
GFR calc Af Amer: >60 mL/min (ref >60)
GFR, EST NON AFRICAN AMERICAN: 60 mL/min — AB (ref >60)
GLUCOSE: 275 mg/dL — AB (ref 65–99)
POTASSIUM: 3.9 mmol/L (ref 3.5–5.1)
Sodium: 134 mmol/L — ABNORMAL LOW (ref 135–145)
Total Bilirubin: 2 mg/dL — ABNORMAL HIGH (ref 0.3–1.2)
Total Protein: 7.8 g/dL (ref 6.5–8.1)

## 2015-03-11 LAB — GLUCOSE, CAPILLARY
GLUCOSE-CAPILLARY: 157 mg/dL — AB (ref 65–99)
GLUCOSE-CAPILLARY: 216 mg/dL — AB (ref 65–99)

## 2015-03-11 LAB — URINALYSIS COMPLETE WITH MICROSCOPIC (ARMC ONLY)
BILIRUBIN URINE: NEGATIVE
GLUCOSE, UA: 50 mg/dL — AB
Ketones, ur: NEGATIVE mg/dL
Nitrite: NEGATIVE
Protein, ur: NEGATIVE mg/dL
Specific Gravity, Urine: 1.008 (ref 1.005–1.030)
pH: 6 (ref 5.0–8.0)

## 2015-03-11 LAB — CBC WITH DIFFERENTIAL/PLATELET
Basophils Absolute: 0 10*3/uL (ref 0–0.1)
Basophils Relative: 1 %
EOS PCT: 2 %
Eosinophils Absolute: 0.1 10*3/uL (ref 0–0.7)
HEMATOCRIT: 42.5 % (ref 35.0–47.0)
HEMOGLOBIN: 14.5 g/dL (ref 12.0–16.0)
LYMPHS ABS: 1.4 10*3/uL (ref 1.0–3.6)
Lymphocytes Relative: 26 %
MCH: 32.2 pg (ref 26.0–34.0)
MCHC: 34 g/dL (ref 32.0–36.0)
MCV: 94.6 fL (ref 80.0–100.0)
MONO ABS: 0.4 10*3/uL (ref 0.2–0.9)
Monocytes Relative: 8 %
NEUTROS ABS: 3.6 10*3/uL (ref 1.4–6.5)
NEUTROS PCT: 63 %
Platelets: 115 10*3/uL — ABNORMAL LOW (ref 150–440)
RBC: 4.49 MIL/uL (ref 3.80–5.20)
RDW: 15.6 % — AB (ref 11.5–14.5)
WBC: 5.6 10*3/uL (ref 3.6–11.0)

## 2015-03-11 LAB — TROPONIN I: Troponin I: <0.03 ng/mL (ref <0.031)

## 2015-03-11 LAB — TSH: TSH: 5.108 u[IU]/mL — ABNORMAL HIGH (ref 0.350–4.500)

## 2015-03-11 LAB — LIPASE, BLOOD: Lipase: 53 U/L — ABNORMAL HIGH (ref 22–51)

## 2015-03-11 LAB — CORTISOL: Cortisol, Plasma: 17.4 ug/dL

## 2015-03-11 MED ORDER — PROCHLORPERAZINE EDISYLATE 5 MG/ML IJ SOLN
INTRAMUSCULAR | Status: AC
  Administered 2015-03-11: 10 mg via INTRAVENOUS
  Filled 2015-03-11: qty 2

## 2015-03-11 MED ORDER — MORPHINE SULFATE 2 MG/ML IJ SOLN
1.0000 mg | INTRAMUSCULAR | Status: DC | PRN
  Administered 2015-03-11 – 2015-03-13 (×3): 1 mg via INTRAVENOUS
  Filled 2015-03-11: qty 1

## 2015-03-11 MED ORDER — PROCHLORPERAZINE EDISYLATE 5 MG/ML IJ SOLN
10.0000 mg | Freq: Once | INTRAMUSCULAR | Status: AC
  Administered 2015-03-11: 10 mg via INTRAVENOUS

## 2015-03-11 MED ORDER — ACETAMINOPHEN 650 MG RE SUPP: 650.0000 mg | Freq: Four times a day (QID) | RECTAL | Status: DC | PRN

## 2015-03-11 MED ORDER — ONDANSETRON HCL 4 MG/2ML IJ SOLN
4.0000 mg | Freq: Four times a day (QID) | INTRAMUSCULAR | Status: DC | PRN
  Administered 2015-03-11 – 2015-03-14 (×7): 4 mg via INTRAVENOUS
  Filled 2015-03-11 (×6): qty 2

## 2015-03-11 MED ORDER — DIPHENHYDRAMINE HCL 50 MG/ML IJ SOLN
25.0000 mg | Freq: Once | INTRAMUSCULAR | Status: AC
  Administered 2015-03-11: 25 mg via INTRAVENOUS

## 2015-03-11 MED ORDER — HYDROCODONE-ACETAMINOPHEN 5-325 MG PO TABS
1.0000 | ORAL_TABLET | Freq: Four times a day (QID) | ORAL | Status: DC | PRN
  Administered 2015-03-11 – 2015-03-13 (×4): 1 via ORAL
  Filled 2015-03-11 (×4): qty 1

## 2015-03-11 MED ORDER — SODIUM CHLORIDE 0.9 % IJ SOLN
3.0000 mL | Freq: Two times a day (BID) | INTRAMUSCULAR | Status: DC
Start: 2015-03-11 — End: 2015-03-14
  Administered 2015-03-11 – 2015-03-14 (×5): 3 mL via INTRAVENOUS

## 2015-03-11 MED ORDER — MORPHINE SULFATE 2 MG/ML IJ SOLN
INTRAMUSCULAR | Status: AC
Start: 2015-03-11 — End: 2015-03-11
  Administered 2015-03-11: 1 mg via INTRAVENOUS
  Filled 2015-03-11: qty 1

## 2015-03-11 MED ORDER — ENOXAPARIN SODIUM 40 MG/0.4ML ~~LOC~~ SOLN
40.0000 mg | SUBCUTANEOUS | Status: DC
  Administered 2015-03-11: 40 mg via SUBCUTANEOUS
  Filled 2015-03-11: qty 0.4

## 2015-03-11 MED ORDER — ATORVASTATIN CALCIUM 20 MG PO TABS
40.0000 mg | ORAL_TABLET | Freq: Every day | ORAL | Status: DC
  Administered 2015-03-11 – 2015-03-13 (×3): 40 mg via ORAL
  Filled 2015-03-11 (×3): qty 2

## 2015-03-11 MED ORDER — PANTOPRAZOLE SODIUM 40 MG PO TBEC
40.0000 mg | DELAYED_RELEASE_TABLET | Freq: Every day | ORAL | Status: DC
  Administered 2015-03-11 – 2015-03-14 (×4): 40 mg via ORAL
  Filled 2015-03-11 (×4): qty 1

## 2015-03-11 MED ORDER — ONDANSETRON HCL 4 MG PO TABS
4.0000 mg | ORAL_TABLET | Freq: Four times a day (QID) | ORAL | Status: DC | PRN
  Administered 2015-03-13: 4 mg via ORAL
  Filled 2015-03-11: qty 1

## 2015-03-11 MED ORDER — ONDANSETRON HCL 4 MG/2ML IJ SOLN
INTRAMUSCULAR | Status: AC
  Administered 2015-03-11: 4 mg via INTRAVENOUS
  Filled 2015-03-11: qty 2

## 2015-03-11 MED ORDER — MORPHINE SULFATE ER 100 MG PO TBCR
100.0000 mg | EXTENDED_RELEASE_TABLET | Freq: Two times a day (BID) | ORAL | Status: DC
  Administered 2015-03-11 – 2015-03-14 (×6): 100 mg via ORAL
  Filled 2015-03-11 (×6): qty 1

## 2015-03-11 MED ORDER — DIPHENHYDRAMINE HCL 50 MG/ML IJ SOLN
INTRAMUSCULAR | Status: AC
  Administered 2015-03-11: 25 mg via INTRAVENOUS
  Filled 2015-03-11: qty 1

## 2015-03-11 MED ORDER — SODIUM CHLORIDE 0.9 % IV SOLN
INTRAVENOUS | Status: DC
Start: 2015-03-11 — End: 2015-03-12
  Administered 2015-03-11 – 2015-03-12 (×3): via INTRAVENOUS

## 2015-03-11 MED ORDER — ZOLPIDEM TARTRATE 5 MG PO TABS
5.0000 mg | ORAL_TABLET | Freq: Every day | ORAL | Status: DC
  Administered 2015-03-11 – 2015-03-13 (×3): 5 mg via ORAL
  Filled 2015-03-11 (×3): qty 1

## 2015-03-11 MED ORDER — INSULIN ASPART 100 UNIT/ML ~~LOC~~ SOLN
0.0000 [IU] | Freq: Three times a day (TID) | SUBCUTANEOUS | Status: DC
  Administered 2015-03-11: 2 [IU] via SUBCUTANEOUS
  Administered 2015-03-12: 5 [IU] via SUBCUTANEOUS
  Administered 2015-03-12 – 2015-03-13 (×4): 3 [IU] via SUBCUTANEOUS
  Administered 2015-03-13: 5 [IU] via SUBCUTANEOUS
  Administered 2015-03-14: 2 [IU] via SUBCUTANEOUS
  Administered 2015-03-14: 5 [IU] via SUBCUTANEOUS
  Filled 2015-03-11: qty 2
  Filled 2015-03-11: qty 5
  Filled 2015-03-11: qty 3
  Filled 2015-03-11 (×2): qty 5
  Filled 2015-03-11: qty 3
  Filled 2015-03-11: qty 2
  Filled 2015-03-11 (×3): qty 3

## 2015-03-11 MED ORDER — ACETAMINOPHEN 325 MG PO TABS: 650.0000 mg | ORAL_TABLET | Freq: Four times a day (QID) | ORAL | Status: DC | PRN

## 2015-03-11 MED ORDER — LINAGLIPTIN 5 MG PO TABS
5.0000 mg | ORAL_TABLET | Freq: Every day | ORAL | Status: DC
  Administered 2015-03-12 – 2015-03-14 (×3): 5 mg via ORAL
  Filled 2015-03-11 (×3): qty 1

## 2015-03-11 MED ORDER — SODIUM CHLORIDE 0.9 % IV BOLUS (SEPSIS)
1000.0000 mL | Freq: Once | INTRAVENOUS | Status: AC
  Administered 2015-03-11: 1000 mL via INTRAVENOUS

## 2015-03-11 NOTE — Progress Notes (Signed)
   03/11/15 1900  Clinical Encounter Type  Visited With Patient and family together  Visit Type Initial  Spiritual Encounters  Spiritual Needs Prayer  Stress Factors  Patient Stress Factors Health changes   Faith tradition: [employer] Status: Symptomatic anemia/ alert and verbal but eyes were closed down in ED Age/Sex: female/59 Family: daughter and husband arrived during visit Visit Assessment: Chaplain introduced pastoral care to the patient and her family and offered comfort and healing prayers  Chaplains and pastoral care can be reached via pager [phone] and by submitting an online request

## 2015-03-11 NOTE — ED Notes (Signed)
Pt comes into the ED via EMS from home with c/o syncope, states everytime  She would stand her HR with race and she would pass out for the past 2-3 days..states for the past week she would have episodes 2-3x per day.Marland KitchenMarland KitchenEMS reports pt b/p 70's/50's

## 2015-03-11 NOTE — H&P (Cosign Needed)
[name] [place] NAME: [name]    MR#:  [id number]  DATE OF BIRTH:  Sep 21, 1955  DATE OF ADMISSION:  03/11/2015  PRIMARY CARE PHYSICIAN: Alvester Chou, NP   REQUESTING/REFERRING PHYSICIAN: Larae Grooms  CHIEF COMPLAINT:   Chief Complaint  Patient presents with  . Weakness  . Loss of Consciousness    HISTORY OF PRESENT ILLNESS: [name]  is a 59 y.o. female with a known history of diabetes type 2, hypothyroidism, GERD,  history of TIA, history of syncope in the past and chronic back pain on opiate therapy. Who presents to the ED complaining of feeling very weak ongoing for the past few days. Then she yesterday she was standing when she passed out. Patient continued to have the feeling of dizziness and every time she exerted herself her heart started beating fast and she felt like she was passed out. When EMS arrived to pick her up her blood pressure was noted to be low in the 70s they gave her IV fluid bolus. Patient's heart rate was also noted to be low. She is on a beta blocker and lisinopril. She denies any recent sickness including fever diarrhea and  Vomiting. She does have chronic nausea.  PAST MEDICAL HISTORY:   Past Medical History  Diagnosis Date  . Diabetes mellitus   . Hypothyroid   . Degenerative disk disease   . Stomach ulcer   . Diverticulitis   . Syncope 01/2015  . Anxiety   . GERD (gastroesophageal reflux disease)   . History of hiatal hernia   . Cancer     HX OF CANCER OF UTERUS   . TIA (transient ischemic attack)     PAST SURGICAL HISTORY:  Past Surgical History  Procedure Laterality Date  . Hernia repair    . Abdominal hysterectomy    . Cholecystectomy      SOCIAL HISTORY:  History  Substance Use Topics  . Smoking status: Never Smoker   . Smokeless tobacco: Never Used  . Alcohol Use: No    FAMILY HISTORY:  Family History  Problem Relation Age of Onset  . Hypertension Mother   .  CAD Sister     DRUG ALLERGIES:  Allergies  Allergen Reactions  . Erythromycin Base Other (See Comments)    Fever  . Metformin     REACTION: Loose stools  . Pioglitazone Hcl-Metformin Hcl     REACTION: Diarrhea  . Rosiglitazone Maleate     REACTION: Swelling  . Sitagliptin-Metformin Hcl     REACTION: Rash  . Codeine Sulfate Rash  . Tetanus-Diphtheria Toxoids Td Rash and Other (See Comments)    Fever    REVIEW OF SYSTEMS:   CONSTITUTIONAL: No fever, positive fatigue or positive weakness.  EYES: No blurred or double vision.  EARS, NOSE, AND THROAT: No tinnitus or ear pain.  RESPIRATORY: No cough, shortness of breath, wheezing or hemoptysis.  CARDIOVASCULAR: No chest pain, orthopnea, edema. Positive Syncope GASTROINTESTINAL: Chronic nausea, vomiting, diarrhea, chronic abdominal pain.  GENITOURINARY: No dysuria, hematuria.  ENDOCRINE: No polyuria, nocturia,  HEMATOLOGY: No anemia, easy bruising or bleeding SKIN: No rash or lesion. MUSCULOSKELETAL: No joint pain or arthritis.   NEUROLOGIC: No tingling, numbness, weakness.  PSYCHIATRY: No anxiety or depression.   MEDICATIONS AT HOME:  Prior to Admission medications   Medication Sig Start Date End Date Taking? Authorizing Provider  atorvastatin (LIPITOR) 40 MG tablet Take 1 tablet (40 mg total) by mouth daily at  6 PM. 02/17/15  Yes Bettey Costa, MD  HYDROcodone-acetaminophen (NORCO/VICODIN) 5-325 MG per tablet Take 1 tablet by mouth every 6 (six) hours as needed for moderate pain.   Yes Historical Provider, MD  JANUVIA 100 MG tablet Take 100 mg by mouth daily. 12/15/14  Yes Historical Provider, MD  lisinopril (PRINIVIL,ZESTRIL) 10 MG tablet Take 10 mg by mouth daily.   Yes Historical Provider, MD  metoprolol succinate (TOPROL-XL) 100 MG 24 hr tablet Take 1 tablet by mouth daily.   Yes Historical Provider, MD  morphine (MS CONTIN) 100 MG 12 hr tablet Take 100 mg by mouth 2 (two) times daily.    Yes Historical Provider, MD  pantoprazole  (PROTONIX) 20 MG tablet Take 1 tablet by mouth 2 (two) times daily.   Yes Historical Provider, MD  tiZANidine (ZANAFLEX) 4 MG tablet Take 1 tablet by mouth every 6 (six) hours as needed for muscle spasms.    Yes Historical Provider, MD  zolpidem (AMBIEN) 5 MG tablet Take 1 tablet by mouth at bedtime.   Yes Historical Provider, MD      PHYSICAL EXAMINATION:   VITAL SIGNS: Blood pressure 91/66, pulse 50, temperature 98.2 F (36.8 C), temperature source Oral, resp. rate 14, height 5' 5"  (1.651 m), weight 68.04 kg (150 lb), SpO2 96 %.  GENERAL:  60 y.o.-year-old patient lying in the bed with no acute distress.  EYES: Pupils equal, round, reactive to light and accommodation. No scleral icterus. Extraocular muscles intact.  HEENT: Head atraumatic, normocephalic. Oropharynx and nasopharynx clear.  NECK:  Supple, no jugular venous distention. No thyroid enlargement, no tenderness.  LUNGS: Normal breath sounds bilaterally, no wheezing, rales,rhonchi or crepitation. No use of accessory muscles of respiration.  CARDIOVASCULAR: S1, S2 normal. No murmurs, rubs, or gallops.  ABDOMEN: Soft, nontender, nondistended. Bowel sounds present. No organomegaly or mass.  EXTREMITIES: No pedal edema, cyanosis, or clubbing.  NEUROLOGIC: Cranial nerves II through XII are intact. Muscle strength 5/5 in all extremities. Sensation intact. Gait not checked.  PSYCHIATRIC: The patient is alert and oriented x 3.  SKIN: No obvious rash, lesion, or ulcer.   LABORATORY PANEL:   CBC  Recent Labs Lab 03/11/15 1122  WBC 5.6  HGB 14.5  HCT 42.5  PLT 115*  MCV 94.6  MCH 32.2  MCHC 34.0  RDW 15.6*  LYMPHSABS 1.4  MONOABS 0.4  EOSABS 0.1  BASOSABS 0.0   ------------------------------------------------------------------------------------------------------------------  Chemistries   Recent Labs Lab 03/11/15 1122  NA 134*  K 3.9  CL 100*  CO2 25  GLUCOSE 275*  BUN 21*  CREATININE 1.00  CALCIUM 9.5  AST 41   ALT 39  ALKPHOS 101  BILITOT 2.0*   ------------------------------------------------------------------------------------------------------------------ estimated creatinine clearance is 53.2 mL/min (by C-G formula based on Cr of 1). ------------------------------------------------------------------------------------------------------------------ No results for input(s): TSH, T4TOTAL, T3FREE, THYROIDAB in the last 72 hours.  Invalid input(s): FREET3   Coagulation profile No results for input(s): INR, PROTIME in the last 168 hours. ------------------------------------------------------------------------------------------------------------------- No results for input(s): DDIMER in the last 72 hours. -------------------------------------------------------------------------------------------------------------------  Cardiac Enzymes  Recent Labs Lab 03/11/15 1122  TROPONINI <0.03   ------------------------------------------------------------------------------------------------------------------ Invalid input(s): POCBNP  ---------------------------------------------------------------------------------------------------------------  Urinalysis    Component Value Date/Time   COLORURINE YELLOW* 03/11/2015 1122   APPEARANCEUR HAZY* 03/11/2015 1122   LABSPEC 1.008 03/11/2015 1122   PHURINE 6.0 03/11/2015 1122   GLUCOSEU 50* 03/11/2015 1122   HGBUR 1+* 03/11/2015 1122   BILIRUBINUR NEGATIVE 03/11/2015 1122   KETONESUR NEGATIVE 03/11/2015 1122  PROTEINUR NEGATIVE 03/11/2015 1122   UROBILINOGEN 1.0 01/10/2015 2235   NITRITE NEGATIVE 03/11/2015 1122   LEUKOCYTESUR 1+* 03/11/2015 1122     RADIOLOGY: Dg Chest 1 View  03/11/2015   CLINICAL DATA:  Headache with syncope for 4 or 5 days. Slight chest pain and shortness of breath. Initial encounter.  EXAM: CHEST  1 VIEW  COMPARISON:  Radiographs 01/10/2015.  Abdominal CT 10/08/2014.  FINDINGS: 1041 hr. The heart size and mediastinal  contours are normal. The lungs are clear. There is no pleural effusion or pneumothorax. No acute osseous findings are identified.  IMPRESSION: No active cardiopulmonary process.   Electronically Signed   By: Richardean Sale M.D.   On: 03/11/2015 10:58   Ct Head Wo Contrast  03/11/2015   CLINICAL DATA:  Diffuse headache for 3 days, more severe on the right than on left. History of prior cervical carcinoma.  EXAM: CT HEAD WITHOUT CONTRAST  TECHNIQUE: Contiguous axial images were obtained from the base of the skull through the vertex without intravenous contrast.  COMPARISON:  Head CT February 15, 2015 and brain MRI February 16, 2015  FINDINGS: The ventricles and sulci appear within normal limits with respect to size and configuration for age. There is no intracranial mass, hemorrhage, extra-axial fluid collection, or midline shift. Gray-white compartments appear within normal limits. No acute infarct evident. The bony calvarium appears intact. The mastoid air cells are clear.  IMPRESSION: No intracranial mass, hemorrhage, or focal gray - white compartment lesions/acute appearing infarct. No appreciable change compared to recent prior study.   Electronically Signed   By: Lowella Grip III M.D.   On: 03/11/2015 11:07    EKG: Orders placed or performed during the hospital encounter of 03/11/15  . ED EKG  . ED EKG    IMPRESSION AND PLAN:  Patient is a 59 year old presents with a syncopal episode noted to be hypotensive and bradycardic.  1. Syncope with collapse: Due to hypotension as well as bradycardia, at this time will hold her metoprolol and lisinopril. Give her IV fluids. Monitor on telemetry cardiology consult. Also patient on zanaflex which can also cause hypotention  2. Diabetes type 2: Place on sliding scale, continue Januvia  3. Chronic back pain; we'll continue her home regimen  4. Hyperlipidemia; continue atorvastatin  5. Miscellaneous; patient will be on Lovenox for DVT prophylaxis   All  the records are reviewed and case discussed with ED provider. Management plans discussed with the patient, family and they are in agreement.  CODE STATUS: Full    TOTAL TIME TAKING CARE OF THIS PATIENT: 55 minutes.    Dustin Flock M.D on 03/11/2015 at 2:06 PM  Between 7am to 6pm - Pager - [phone]  After 6pm go to [email or web address] - password [email or web address] [facility]  [place] [facility]  Office  [phone]  CC: Primary care physician; Alvester Chou, NP

## 2015-03-11 NOTE — ED Provider Notes (Signed)
[facility] Emergency Department Provider Note  ____________________________________________  Time seen: Seen upon arrival to the emergency department  I have reviewed the triage vital signs and the nursing notes.   HISTORY  Chief Complaint Weakness and Loss of Consciousness    HPI [name] is a 59 y.o. female with a history of TIA and uterine cancer who presents today with 4 days of exertional syncope. She says that she can't walk more than 5-6 steps without feeling her heart race and then passing out. She denies any chest pain or shortness of breath at this time. Her blood pressure for the medics was 70s over 50s but resolved to 100s with a fluid bolus. He has received about 250 cc of fluid en route. Patient is also complaining of a diffuse headache which is aching and associated with photophobia and nausea. Says she has had this since yesterday. Says that it has been a gradual onset headache. Also associated with dizziness that is not worsened with motion since yesterday. Says that she is supposed to be on aspirin but has not taken it.   Past Medical History  Diagnosis Date  . Diabetes mellitus   . Hypothyroid   . Degenerative disk disease   . Stomach ulcer   . Diverticulitis   . Syncope 01/2015  . Anxiety   . GERD (gastroesophageal reflux disease)   . History of hiatal hernia   . Cancer     HX OF CANCER OF UTERUS   . TIA (transient ischemic attack)     Patient Active Problem List   Diagnosis Date Noted  . CVA (cerebral infarction) 02/15/2015  . Nausea with vomiting 01/12/2015  . Dehydration 01/12/2015  . Essential hypertension 01/12/2015  . Orthostatic hypotension 01/12/2015  . Chronically on opiate therapy 01/12/2015  . Gastroparesis 01/12/2015  . Orthostatic syncope 01/11/2015  . DEPRESSION/ANXIETY 06/27/2007  . MYOFASCIAL PAIN SYNDROME 06/27/2007  . Chronic pain syndrome 03/28/2007  . DM2 (diabetes mellitus, type 2) 03/27/2007  .  GERD 03/27/2007  . DIVERTICULOSIS, COLON 03/27/2007  . LUMBAR DISC DISPLACEMENT 03/27/2007  . PROTEINURIA 03/27/2007  . UTERINE CANCER, HX OF 03/27/2007    Past Surgical History  Procedure Laterality Date  . Hernia repair    . Abdominal hysterectomy    . Cholecystectomy      Current Outpatient Rx  Name  Route  Sig  Dispense  Refill  . atorvastatin (LIPITOR) 40 MG tablet   Oral   Take 1 tablet (40 mg total) by mouth daily at 6 PM.   30 tablet   0   . HYDROcodone-acetaminophen (NORCO/VICODIN) 5-325 MG per tablet   Oral   Take 1 tablet by mouth every 6 (six) hours as needed for moderate pain.         Marland Kitchen JANUVIA 100 MG tablet   Oral   Take 100 mg by mouth daily.           Dispense as written.   Marland Kitchen lisinopril (PRINIVIL,ZESTRIL) 10 MG tablet   Oral   Take 10 mg by mouth daily.         . metoprolol succinate (TOPROL-XL) 100 MG 24 hr tablet   Oral   Take 1 tablet by mouth daily.         Marland Kitchen morphine (MS CONTIN) 100 MG 12 hr tablet   Oral   Take 100 mg by mouth 2 (two) times daily.          . pantoprazole (PROTONIX) 20 MG  tablet   Oral   Take 1 tablet by mouth 2 (two) times daily.         Marland Kitchen tiZANidine (ZANAFLEX) 4 MG tablet   Oral   Take 1 tablet by mouth every 6 (six) hours as needed for muscle spasms.          Marland Kitchen zolpidem (AMBIEN) 5 MG tablet   Oral   Take 1 tablet by mouth at bedtime.           Allergies Erythromycin base; Metformin; Pioglitazone hcl-metformin hcl; Rosiglitazone maleate; Sitagliptin-metformin hcl; Codeine sulfate; and Tetanus-diphtheria toxoids td  No family history on file.  Social History History  Substance Use Topics  . Smoking status: Never Smoker   . Smokeless tobacco: Never Used  . Alcohol Use: No    Review of Systems Constitutional: No fever/chills Eyes: No visual changes. ENT: No sore throat. Cardiovascular: Denies chest pain. Respiratory: Denies shortness of breath. Gastrointestinal: No abdominal pain.   no  vomiting.  No diarrhea.  No constipation. Genitourinary: Negative for dysuria. Musculoskeletal: Negative for back pain. Skin: Negative for rash. Neurological: Focal weakness to the right upper and lower extremities which she says has been there since her "TIA" several weeks ago. 10-point ROS otherwise negative.  ____________________________________________   PHYSICAL EXAM:  VITAL SIGNS: ED Triage Vitals  Enc Vitals Group     BP 03/11/15 1030 119/60 mmHg     Pulse Rate 03/11/15 1030 55     Resp 03/11/15 1052 18     Temp 03/11/15 1052 98.2 F (36.8 C)     Temp Source 03/11/15 1052 Oral     SpO2 03/11/15 1030 97 %     Weight 03/11/15 1052 150 lb (68.04 kg)     Height 03/11/15 1052 5' 5"  (1.651 m)     Head Cir --      Peak Flow --      Pain Score 03/11/15 1053 10     Pain Loc --      Pain Edu? --      Excl. in [place]? --     Constitutional: Alert and oriented. Well appearing and in no acute distress. Eyes: Conjunctivae are normal. PERRL. EOMI. Head: Atraumatic. Nose: No congestion/rhinnorhea. Mouth/Throat: Mucous membranes are moist.  Oropharynx non-erythematous. Neck: No stridor.   Cardiovascular: Bradycardic, regular rhythm. Grossly normal heart sounds.  Good peripheral circulation. Respiratory: Normal respiratory effort.  No retractions. Lungs CTAB. Gastrointestinal: Soft with mild tenderness to the left upper quadrant. No distention. No abdominal bruits. No CVA tenderness. Musculoskeletal: No lower extremity tenderness nor edema.  No joint effusions. Neurologic:  Normal speech and language. No facial asymmetry. No nystagmus. Speech is normal. A 4 out of 5 strength to the right upper and lower extremities. Skin:  Skin is warm, dry with a 4 x 3 cm ulceration to the left lateral thigh. There is a surrounding 1 cm wheal of erythema with a black eschar overlying it.  Psychiatric: Mood and affect are normal. Speech and behavior are  normal.  ____________________________________________   LABS (all labs ordered are listed, but only abnormal results are displayed)  Labs Reviewed  CBC WITH DIFFERENTIAL/PLATELET - Abnormal; Notable for the following:    RDW 15.6 (*)    Platelets 115 (*)    All other components within normal limits  COMPREHENSIVE METABOLIC PANEL - Abnormal; Notable for the following:    Sodium 134 (*)    Chloride 100 (*)    Glucose, Bld 275 (*)  BUN 21 (*)    Total Bilirubin 2.0 (*)    GFR calc non Af Amer 60 (*)    All other components within normal limits  LIPASE, BLOOD - Abnormal; Notable for the following:    Lipase 53 (*)    All other components within normal limits  TROPONIN I  URINALYSIS COMPLETEWITH MICROSCOPIC ([facility] ONLY)   ____________________________________________  EKG  ED ECG REPORT I, Doran Stabler, the attending physician, personally viewed and interpreted this ECG.   Date: 03/11/2015  EKG Time: 1047  Rate: 38  Rhythm: sinus bradycardia  Axis: Normal axis  Intervals:none  ST&T Change: No ST elevations or depressions. No abnormal T-wave inversions.  ____________________________________________  RADIOLOGY  CT of the brain without any change from previous recent study. No active disease on the chest x-ray. I personally reviewed these images.  ____________________________________________   PROCEDURES   ____________________________________________   INITIAL IMPRESSION / ASSESSMENT AND PLAN / ED COURSE  Pertinent labs & imaging results that were available during my care of the patient were reviewed by me and considered in my medical decision making (see chart for details).  ----------------------------------------- 1:27 PM on 03/11/2015 -----------------------------------------  Patient resting comfortably but still complaining of chronic pain. Blood pressure improved with fluids. Still pending urine. Heart rate intermittently into the 30s on the  monitor. We will admit for syncope with bradycardia. Signed out to Dr. Anselm Jungling. ____________________________________________   FINAL CLINICAL IMPRESSION(S) / ED DIAGNOSES  Acute syncope acute bradycardia. Acute hypotension, resolved. Initial visit.    Orbie Pyo, MD 03/11/15 1328

## 2015-03-11 NOTE — Care Management (Signed)
Patient  Had been referred to [facility] last admit and agency made numerous attempts to conatct patient and never answered the phone.  Agency contacted her PCP and was informed that patient sleeps late and does not always answer the phone.  Agency made cold calls and patient in agreement with services but patient was never available for the home visits.

## 2015-03-12 LAB — GLUCOSE, CAPILLARY
GLUCOSE-CAPILLARY: 270 mg/dL — AB (ref 65–99)
GLUCOSE-CAPILLARY: 239 mg/dL — AB (ref 65–99)
Glucose-Capillary: 219 mg/dL — ABNORMAL HIGH (ref 65–99)
Glucose-Capillary: 234 mg/dL — ABNORMAL HIGH (ref 65–99)

## 2015-03-12 LAB — BASIC METABOLIC PANEL
Anion gap: 7 (ref 5–15)
BUN: 20 mg/dL (ref 6–20)
CO2: 23 mmol/L (ref 22–32)
Calcium: 8.5 mg/dL — ABNORMAL LOW (ref 8.9–10.3)
Chloride: 111 mmol/L (ref 101–111)
Creatinine, Ser: 0.98 mg/dL (ref 0.44–1.00)
GFR calc Af Amer: >60 mL/min (ref >60)
GFR calc non Af Amer: >60 mL/min (ref >60)
Glucose, Bld: 246 mg/dL — ABNORMAL HIGH (ref 65–99)
Potassium: 4 mmol/L (ref 3.5–5.1)
SODIUM: 141 mmol/L (ref 135–145)

## 2015-03-12 LAB — CBC
HCT: 40.5 % (ref 35.0–47.0)
Hemoglobin: 13.4 g/dL (ref 12.0–16.0)
MCH: 31.9 pg (ref 26.0–34.0)
MCHC: 33 g/dL (ref 32.0–36.0)
MCV: 96.5 fL (ref 80.0–100.0)
Platelets: 97 10*3/uL — ABNORMAL LOW (ref 150–440)
RBC: 4.2 MIL/uL (ref 3.80–5.20)
RDW: 15.9 % — AB (ref 11.5–14.5)
WBC: 5.3 10*3/uL (ref 3.6–11.0)

## 2015-03-12 LAB — HEMOGLOBIN A1C: HEMOGLOBIN A1C: 7.6 % — AB (ref 4.0–6.0)

## 2015-03-12 MED ORDER — METOPROLOL SUCCINATE ER 100 MG PO TB24
100.0000 mg | ORAL_TABLET | Freq: Every day | ORAL | Status: DC
  Administered 2015-03-12 – 2015-03-13 (×2): 100 mg via ORAL
  Filled 2015-03-12 (×2): qty 1

## 2015-03-12 MED ORDER — LISINOPRIL 10 MG PO TABS
10.0000 mg | ORAL_TABLET | Freq: Every day | ORAL | Status: DC
  Administered 2015-03-12 – 2015-03-13 (×2): 10 mg via ORAL
  Filled 2015-03-12 (×2): qty 1

## 2015-03-12 MED ORDER — GLUCERNA SHAKE PO LIQD
237.0000 mL | Freq: Two times a day (BID) | ORAL | Status: DC
  Administered 2015-03-12 – 2015-03-14 (×5): 237 mL via ORAL

## 2015-03-12 MED ORDER — HYDRALAZINE HCL 20 MG/ML IJ SOLN
10.0000 mg | INTRAMUSCULAR | Status: DC | PRN
  Administered 2015-03-12: 21:00:00 via INTRAVENOUS
  Filled 2015-03-12: qty 1

## 2015-03-12 NOTE — Consult Note (Signed)
Reason for Consult:Syncope Weakness Referring Physician: Hospitalist Dr Laverna Peace is an 59 y.o. female.  HPI: Pt is a 59 y/o female who had a syncope episode. Pt c/o weakness and fatigue. She denied cp or sob. She has DM and HTN. The is ia prior hx of CVA.She denies prior syncope. She feels better now. Denies new focal weakness..  Past Medical History  Diagnosis Date  . Diabetes mellitus   . Hypothyroid   . Degenerative disk disease   . Stomach ulcer   . Diverticulitis   . Syncope 01/2015  . Anxiety   . GERD (gastroesophageal reflux disease)   . History of hiatal hernia   . Cancer     HX OF CANCER OF UTERUS   . TIA (transient ischemic attack)     Past Surgical History  Procedure Laterality Date  . Hernia repair    . Abdominal hysterectomy    . Cholecystectomy      Family History  Problem Relation Age of Onset  . Hypertension Mother   . CAD Sister     Social History:  reports that she has never smoked. She has never used smokeless tobacco. She reports that she does not drink alcohol or use illicit drugs.  Allergies:  Allergies  Allergen Reactions  . Erythromycin Base Other (See Comments)    Fever  . Metformin     REACTION: Loose stools  . Pioglitazone Hcl-Metformin Hcl     REACTION: Diarrhea  . Rosiglitazone Maleate     REACTION: Swelling  . Sitagliptin-Metformin Hcl     REACTION: Rash  . Codeine Sulfate Rash  . Tetanus-Diphtheria Toxoids Td Rash and Other (See Comments)    Fever    Medications:  Prior to Admission:  Prescriptions prior to admission  Medication Sig Dispense Refill Last Dose  . atorvastatin (LIPITOR) 40 MG tablet Take 1 tablet (40 mg total) by mouth daily at 6 PM. 30 tablet 0 03/10/2015 at pm  . HYDROcodone-acetaminophen (NORCO/VICODIN) 5-325 MG per tablet Take 1 tablet by mouth every 6 (six) hours as needed for moderate pain.   unknown  . JANUVIA 100 MG tablet Take 100 mg by mouth daily.   03/10/2015 at am  . lisinopril  (PRINIVIL,ZESTRIL) 10 MG tablet Take 10 mg by mouth daily.   03/10/2015 at am  . metoprolol succinate (TOPROL-XL) 100 MG 24 hr tablet Take 1 tablet by mouth daily.   03/10/2015 at 0800  . morphine (MS CONTIN) 100 MG 12 hr tablet Take 100 mg by mouth 2 (two) times daily.    03/10/2015 at pm  . pantoprazole (PROTONIX) 20 MG tablet Take 1 tablet by mouth 2 (two) times daily.   03/10/2015 at pm  . tiZANidine (ZANAFLEX) 4 MG tablet Take 1 tablet by mouth every 6 (six) hours as needed for muscle spasms.    unknown  . zolpidem (AMBIEN) 5 MG tablet Take 1 tablet by mouth at bedtime.   03/10/2015 at pm    Results for orders placed or performed during the hospital encounter of 03/11/15 (from the past 48 hour(s))  CBC with Differential     Status: Abnormal   Collection Time: 03/11/15 11:22 AM  Result Value Ref Range   WBC 5.6 3.6 - 11.0 K/uL   RBC 4.49 3.80 - 5.20 MIL/uL   Hemoglobin 14.5 12.0 - 16.0 g/dL   HCT 42.5 35.0 - 47.0 %   MCV 94.6 80.0 - 100.0 fL   MCH 32.2 26.0 - 34.0  pg   MCHC 34.0 32.0 - 36.0 g/dL   RDW 15.6 (H) 11.5 - 14.5 %   Platelets 115 (L) 150 - 440 K/uL   Neutrophils Relative % 63 %   Neutro Abs 3.6 1.4 - 6.5 K/uL   Lymphocytes Relative 26 %   Lymphs Abs 1.4 1.0 - 3.6 K/uL   Monocytes Relative 8 %   Monocytes Absolute 0.4 0.2 - 0.9 K/uL   Eosinophils Relative 2 %   Eosinophils Absolute 0.1 0 - 0.7 K/uL   Basophils Relative 1 %   Basophils Absolute 0.0 0 - 0.1 K/uL  Comprehensive metabolic panel     Status: Abnormal   Collection Time: 03/11/15 11:22 AM  Result Value Ref Range   Sodium 134 (L) 135 - 145 mmol/L   Potassium 3.9 3.5 - 5.1 mmol/L   Chloride 100 (L) 101 - 111 mmol/L   CO2 25 22 - 32 mmol/L   Glucose, Bld 275 (H) 65 - 99 mg/dL   BUN 21 (H) 6 - 20 mg/dL   Creatinine, Ser 1.00 0.44 - 1.00 mg/dL   Calcium 9.5 8.9 - 10.3 mg/dL   Total Protein 7.8 6.5 - 8.1 g/dL   Albumin 4.0 3.5 - 5.0 g/dL   AST 41 15 - 41 U/L   ALT 39 14 - 54 U/L   Alkaline Phosphatase 101 38 -  126 U/L   Total Bilirubin 2.0 (H) 0.3 - 1.2 mg/dL   GFR calc non Af Amer 60 (L) >60 mL/min   GFR calc Af Amer >60 >60 mL/min    Comment: (NOTE) The eGFR has been calculated using the CKD EPI equation. This calculation has not been validated in all clinical situations. eGFR's persistently <60 mL/min signify possible Chronic Kidney Disease.    Anion gap 9 5 - 15  Lipase, blood     Status: Abnormal   Collection Time: 03/11/15 11:22 AM  Result Value Ref Range   Lipase 53 (H) 22 - 51 U/L  Troponin I     Status: None   Collection Time: 03/11/15 11:22 AM  Result Value Ref Range   Troponin I <0.03 <0.031 ng/mL    Comment:        NO INDICATION OF MYOCARDIAL INJURY.   Urinalysis complete, with microscopic ([facility] only)     Status: Abnormal   Collection Time: 03/11/15 11:22 AM  Result Value Ref Range   Color, Urine YELLOW (A) YELLOW   APPearance HAZY (A) CLEAR   Glucose, UA 50 (A) NEGATIVE mg/dL   Bilirubin Urine NEGATIVE NEGATIVE   Ketones, ur NEGATIVE NEGATIVE mg/dL   Specific Gravity, Urine 1.008 1.005 - 1.030   Hgb urine dipstick 1+ (A) NEGATIVE   pH 6.0 5.0 - 8.0   Protein, ur NEGATIVE NEGATIVE mg/dL   Nitrite NEGATIVE NEGATIVE   Leukocytes, UA 1+ (A) NEGATIVE   RBC / HPF 0-5 0 - 5 RBC/hpf   WBC, UA 6-30 0 - 5 WBC/hpf   Bacteria, UA RARE (A) NONE SEEN   Squamous Epithelial / LPF 6-30 (A) NONE SEEN   Hyaline Casts, UA PRESENT   Cortisol     Status: None   Collection Time: 03/11/15 11:22 AM  Result Value Ref Range   Cortisol, Plasma 17.4 ug/dL    Comment: (NOTE) AM    6.7 - 22.6 ug/dL PM   <10.0       ug/dL Performed at [facility]   Glucose, capillary     Status: Abnormal   Collection  Time: 03/11/15  5:07 PM  Result Value Ref Range   Glucose-Capillary 157 (H) 65 - 99 mg/dL  Hemoglobin A1c     Status: Abnormal   Collection Time: 03/11/15  5:32 PM  Result Value Ref Range   Hgb A1c MFr Bld 7.6 (H) 4.0 - 6.0 %  TSH     Status: Abnormal   Collection Time:  03/11/15  5:32 PM  Result Value Ref Range   TSH 5.108 (H) 0.350 - 4.500 uIU/mL  Glucose, capillary     Status: Abnormal   Collection Time: 03/11/15  8:59 PM  Result Value Ref Range   Glucose-Capillary 216 (H) 65 - 99 mg/dL   Comment 1 Notify RN   CBC     Status: Abnormal   Collection Time: 03/12/15  4:03 AM  Result Value Ref Range   WBC 5.3 3.6 - 11.0 K/uL   RBC 4.20 3.80 - 5.20 MIL/uL   Hemoglobin 13.4 12.0 - 16.0 g/dL   HCT 40.5 35.0 - 47.0 %   MCV 96.5 80.0 - 100.0 fL   MCH 31.9 26.0 - 34.0 pg   MCHC 33.0 32.0 - 36.0 g/dL   RDW 15.9 (H) 11.5 - 14.5 %   Platelets 97 (L) 150 - 440 K/uL  Basic metabolic panel     Status: Abnormal   Collection Time: 03/12/15  4:03 AM  Result Value Ref Range   Sodium 141 135 - 145 mmol/L   Potassium 4.0 3.5 - 5.1 mmol/L   Chloride 111 101 - 111 mmol/L   CO2 23 22 - 32 mmol/L   Glucose, Bld 246 (H) 65 - 99 mg/dL   BUN 20 6 - 20 mg/dL   Creatinine, Ser 0.98 0.44 - 1.00 mg/dL   Calcium 8.5 (L) 8.9 - 10.3 mg/dL   GFR calc non Af Amer >60 >60 mL/min   GFR calc Af Amer >60 >60 mL/min    Comment: (NOTE) The eGFR has been calculated using the CKD EPI equation. This calculation has not been validated in all clinical situations. eGFR's persistently <60 mL/min signify possible Chronic Kidney Disease.    Anion gap 7 5 - 15  Glucose, capillary     Status: Abnormal   Collection Time: 03/12/15  7:20 AM  Result Value Ref Range   Glucose-Capillary 219 (H) 65 - 99 mg/dL  Glucose, capillary     Status: Abnormal   Collection Time: 03/12/15 11:46 AM  Result Value Ref Range   Glucose-Capillary 270 (H) 65 - 99 mg/dL  Glucose, capillary     Status: Abnormal   Collection Time: 03/12/15  4:10 PM  Result Value Ref Range   Glucose-Capillary 234 (H) 65 - 99 mg/dL  Glucose, capillary     Status: Abnormal   Collection Time: 03/12/15  8:06 PM  Result Value Ref Range   Glucose-Capillary 239 (H) 65 - 99 mg/dL    Dg Chest 1 View  03/11/2015   CLINICAL DATA:   Headache with syncope for 4 or 5 days. Slight chest pain and shortness of breath. Initial encounter.  EXAM: CHEST  1 VIEW  COMPARISON:  Radiographs 01/10/2015.  Abdominal CT 10/08/2014.  FINDINGS: 1041 hr. The heart size and mediastinal contours are normal. The lungs are clear. There is no pleural effusion or pneumothorax. No acute osseous findings are identified.  IMPRESSION: No active cardiopulmonary process.   Electronically Signed   By: Richardean Sale M.D.   On: 03/11/2015 10:58   Ct Head Wo Contrast  03/11/2015  CLINICAL DATA:  Diffuse headache for 3 days, more severe on the right than on left. History of prior cervical carcinoma.  EXAM: CT HEAD WITHOUT CONTRAST  TECHNIQUE: Contiguous axial images were obtained from the base of the skull through the vertex without intravenous contrast.  COMPARISON:  Head CT February 15, 2015 and brain MRI February 16, 2015  FINDINGS: The ventricles and sulci appear within normal limits with respect to size and configuration for age. There is no intracranial mass, hemorrhage, extra-axial fluid collection, or midline shift. Gray-white compartments appear within normal limits. No acute infarct evident. The bony calvarium appears intact. The mastoid air cells are clear.  IMPRESSION: No intracranial mass, hemorrhage, or focal gray - white compartment lesions/acute appearing infarct. No appreciable change compared to recent prior study.   Electronically Signed   By: Lowella Grip III M.D.   On: 03/11/2015 11:07    Review of Systems  Constitutional: Negative.   HENT: Negative.   Eyes: Negative.   Respiratory: Negative.   Cardiovascular: Negative.   Gastrointestinal: Negative.   Genitourinary: Negative.   Musculoskeletal: Negative.   Skin: Negative.   Neurological: Positive for dizziness and loss of consciousness.  Endo/Heme/Allergies: Negative.   Psychiatric/Behavioral: Negative.    Blood pressure 189/86, pulse 89, temperature 98.6 F (37 C), temperature source Oral,  resp. rate 19, height _0  (1.651 m), weight 59.013 kg (130 lb 1.6 oz), SpO2 97 %. Physical Exam  Constitutional: She appears well-developed and well-nourished.  HENT:  Head: Normocephalic.  Eyes: Conjunctivae are normal. Pupils are equal, round, and reactive to light.  Neck: Normal range of motion. Neck supple.  Cardiovascular: Normal rate and normal heart sounds.   Respiratory: Effort normal and breath sounds normal.  GI: Soft. Bowel sounds are normal.  Musculoskeletal: Normal range of motion.  Neurological: She is alert.  Skin: Skin is warm.  Psychiatric: She has a normal mood and affect.    Assessment/Plan: Syncope Weakness Diabetes GERD Hypertension CVA Dehydration Chronic Pain . PLAN Agree with tele ROMI by EKG and enzymes Consider ECHO DVT prophylaxis Continue DM control Agree with HTN therapy Consider carotid dopplers Possible neurology in put  CALLWOOD,DWAYNE D. 03/12/2015, 8:49 PM

## 2015-03-12 NOTE — Progress Notes (Signed)
[facility] [place] NAME: [name]    MR#:  [id number]  DATE OF BIRTH:  02/20/1956  SUBJECTIVE:  CHIEF COMPLAINT:   Chief Complaint  Patient presents with  . Weakness  . Loss of Consciousness   dizziness, weakness, syncope. Still noted to be orthostatic. Does not have any specific complaints presently.  REVIEW OF SYSTEMS:    Review of Systems  Constitutional: Negative for fever and chills.  HENT: Negative for congestion and tinnitus.   Eyes: Negative for blurred vision and double vision.  Respiratory: Negative for cough, shortness of breath and wheezing.   Cardiovascular: Negative for chest pain, orthopnea and PND.  Gastrointestinal: Negative for nausea, vomiting, abdominal pain and diarrhea.  Genitourinary: Negative for dysuria and hematuria.  Neurological: Positive for dizziness and weakness (genearlized). Negative for sensory change and focal weakness.  All other systems reviewed and are negative.   Nutrition: Heart Health Tolerating Diet: Yes Tolerating PT: Await Eval.   DRUG ALLERGIES:   Allergies  Allergen Reactions  . Erythromycin Base Other (See Comments)    Fever  . Metformin     REACTION: Loose stools  . Pioglitazone Hcl-Metformin Hcl     REACTION: Diarrhea  . Rosiglitazone Maleate     REACTION: Swelling  . Sitagliptin-Metformin Hcl     REACTION: Rash  . Codeine Sulfate Rash  . Tetanus-Diphtheria Toxoids Td Rash and Other (See Comments)    Fever    VITALS:  Blood pressure 185/80, pulse 106, temperature 98.8 F (37.1 C), temperature source Oral, resp. rate 21, height 5' 5"  (1.651 m), weight 59.013 kg (130 lb 1.6 oz), SpO2 100 %.  PHYSICAL EXAMINATION:   Physical Exam  GENERAL:  59 y.o.-year-old patient lying in the bed with no acute distress.  EYES: Pupils equal, round, reactive to light and accommodation. No scleral icterus. Extraocular muscles intact.  HEENT: Head atraumatic,  normocephalic. Oropharynx and nasopharynx clear.  NECK:  Supple, no jugular venous distention. No thyroid enlargement, no tenderness.  LUNGS: Normal breath sounds bilaterally, no wheezing, rales, rhonchi. No use of accessory muscles of respiration.  CARDIOVASCULAR: S1, S2 RRR Tachycardic. No murmurs, rubs, or gallops.  ABDOMEN: Soft, nontender, nondistended. Bowel sounds present. No organomegaly or mass.  EXTREMITIES: No cyanosis, clubbing or edema b/l.    NEUROLOGIC: Cranial nerves II through XII are intact. No focal Motor or sensory deficits b/l.   PSYCHIATRIC: The patient is alert and oriented x 3. Good Affect.  SKIN: No obvious rash, lesion, or ulcer.    LABORATORY PANEL:   CBC  Recent Labs Lab 03/12/15 0403  WBC 5.3  HGB 13.4  HCT 40.5  PLT 97*   ------------------------------------------------------------------------------------------------------------------  Chemistries   Recent Labs Lab 03/11/15 1122 03/12/15 0403  NA 134* 141  K 3.9 4.0  CL 100* 111  CO2 25 23  GLUCOSE 275* 246*  BUN 21* 20  CREATININE 1.00 0.98  CALCIUM 9.5 8.5*  AST 41  --   ALT 39  --   ALKPHOS 101  --   BILITOT 2.0*  --    ------------------------------------------------------------------------------------------------------------------  Cardiac Enzymes  Recent Labs Lab 03/11/15 1122  TROPONINI <0.03   ------------------------------------------------------------------------------------------------------------------  RADIOLOGY:  Dg Chest 1 View  03/11/2015   CLINICAL DATA:  Headache with syncope for 4 or 5 days. Slight chest pain and shortness of breath. Initial encounter.  EXAM: CHEST  1 VIEW  COMPARISON:  Radiographs 01/10/2015.  Abdominal CT 10/08/2014.  FINDINGS: 1041 hr. The  heart size and mediastinal contours are normal. The lungs are clear. There is no pleural effusion or pneumothorax. No acute osseous findings are identified.  IMPRESSION: No active cardiopulmonary process.    Electronically Signed   By: Richardean Sale M.D.   On: 03/11/2015 10:58   Ct Head Wo Contrast  03/11/2015   CLINICAL DATA:  Diffuse headache for 3 days, more severe on the right than on left. History of prior cervical carcinoma.  EXAM: CT HEAD WITHOUT CONTRAST  TECHNIQUE: Contiguous axial images were obtained from the base of the skull through the vertex without intravenous contrast.  COMPARISON:  Head CT February 15, 2015 and brain MRI February 16, 2015  FINDINGS: The ventricles and sulci appear within normal limits with respect to size and configuration for age. There is no intracranial mass, hemorrhage, extra-axial fluid collection, or midline shift. Gray-white compartments appear within normal limits. No acute infarct evident. The bony calvarium appears intact. The mastoid air cells are clear.  IMPRESSION: No intracranial mass, hemorrhage, or focal gray - white compartment lesions/acute appearing infarct. No appreciable change compared to recent prior study.   Electronically Signed   By: Lowella Grip III M.D.   On: 03/11/2015 11:07     ASSESSMENT AND PLAN:   59 year old female with past medical history of diabetes, hypothyroidism, previous history of syncope, anxiety, GERD, previous TIA, DJD, who presented to the hospital due to a syncopal episode.  #1 syncope with collapse-the exact etiology of this is unclear but suspected to be secondary to orthostasis. -Patient was recently admitted to the hospital floor and worked up for TIA and had a negative carotid duplex and echo. On telemetry patient has no evidence of arrhythmia. -Patient is still orthostatic although her blood pressures are somewhat on the higher side. I will DC IV fluids. Resume antihypertensives. Repeat orthostatic vital signs later today. -We'll get a physical therapy evaluation to assess mobility. -Await cardiology input  #2 type 2 diabetes without complication-continue Tradjenta, sliding scale insulin.  #3 chronic pain-continue MS  Contin and Vicodin as needed.  #4 hyperlipidemia-continue atorvastatin.  #5 GERD-continue Protonix.  #6 thrombocytopenia-etiology unclear but seems chronic in nature. No evidence of acute bleeding. We'll DC Lovenox   All the records are reviewed and case discussed with Care Management/Social Workerr. Management plans discussed with the patient, family and they are in agreement.  CODE STATUS: Full  DVT Prophylaxis: Teds and SCDs  TOTAL TIME TAKING CARE OF THIS PATIENT: 30 minutes.   POSSIBLE D/C IN 1-2 DAYS, DEPENDING ON CLINICAL CONDITION.   Henreitta Leber M.D on 03/12/2015 at 12:14 PM  Between 7am to 6pm - Pager - [phone]  After 6pm go to [email or web address] - password [email or web address] [facility]  [place] [facility]  Office  [phone]  CC: Primary care physician; Alvester Chou, NP

## 2015-03-12 NOTE — Progress Notes (Signed)
Dr. Nadara Mustard called back with new order for 28m of hydralazine Q4H PRN.

## 2015-03-12 NOTE — Progress Notes (Signed)
PT Cancellation Note  Patient Details Name: [name] MRN: [id number] DOB: 29-May-1956   Cancelled Treatment:    Reason Eval/Treat Not Completed: Medical issues which prohibited therapy.  Elevated BP and pulse, BS.  Will check tomorrow.   Ramond Dial 03/12/2015, 1:04 PM   Mee Hives, PT MS Acute Rehab Dept. Number: [facility] [id number] and [place] [phone]

## 2015-03-12 NOTE — Progress Notes (Signed)
Patient reassessed for chest pain. She states there is no chest pain at this time. Patient lying comfortably in bed dozing to sleep. VSS. Will continue to monitor patient.

## 2015-03-12 NOTE — Progress Notes (Signed)
Returned to patients room with hydralazine and to re-assess and pt. Sitting up in bed eating graham crackers and watching television. When asked about chest pain she stated pain was still there and still sharp and radiating. Hydralazine was administered. Patient continued to be NS rhythm on telemetry. Will continue to monitor patient.

## 2015-03-12 NOTE — Progress Notes (Signed)
Initial Nutrition Assessment    INTERVENTION:   Meals/Snacks: add Glucerna BID between meals Coordination of Care: recommend re-weighing patient due to discrepancies in weight on same day Education: encouraged pt to try and eat smaller meals more frequently at home to improve po intake, better manage FSBS  NUTRITION DIAGNOSIS:  Inadequate oral intake related to social / environmental circumstances (poor appetite) as evidenced by per patient/family report.  GOAL:  Patient will meet greater than or equal to 90% of their needs   MONITOR:   (Energy Intake, Anthropometrics, Electrolyte/Renal rpofile, Glucose Profile, Digestive System)  REASON FOR ASSESSMENT:  Consult Assessment of nutrition requirement/status  ASSESSMENT:  Pt admitted with syncope, collapse; pt reports weakness  PMHx:  Past Medical History  Diagnosis Date  . Diabetes mellitus   . Hypothyroid   . Degenerative disk disease   . Stomach ulcer   . Diverticulitis   . Syncope 01/2015  . Anxiety   . GERD (gastroesophageal reflux disease)   . History of hiatal hernia   . Cancer     HX OF CANCER OF UTERUS   . TIA (transient ischemic attack)     Diet Order: Heart Healthy/Carb Modified  Current Nutrition: Pt ate good breakfast this AM, ate 2 pancakes and a banana, drank some Coffee. Pt reports fair appetite  Food/Nutrition-Related History: Pt reports for last 3-4 days not eating much of anything. Reports she typically eats only 1 meal per day, partially due to appetite but per patient report this is mostly due to access to food; pt reports she is disabled and lives with her husband and 2 sons (none of which work). Pt reports she eats what she can, cannot afford supplements either   Medications: ss novolog  Electrolyte/Renal Profile and Glucose Profile:   Recent Labs Lab 03/11/15 1122 03/12/15 0403  NA 134* 141  K 3.9 4.0  CL 100* 111  CO2 25 23  BUN 21* 20  CREATININE 1.00 0.98  CALCIUM 9.5 8.5*   GLUCOSE 275* 246*   Protein Profile:   Recent Labs Lab 03/11/15 1122  ALBUMIN 4.0    Nutrition-Focused Physical Exam Findings: Nutrition-Focused physical exam completed. Findings are wdl fat depletion, wdl muscle depletion in all areas except temple which is mild wasting.   Weight Change: Pt reports she weighed 145 pounds a few weeks ago Anthropometrics:    Height:  Ht Readings from Last 1 Encounters:  03/11/15 5' 5"  (1.651 m)    Weight:  Wt Readings from Last 1 Encounters:  03/11/15 130 lb 1.6 oz (59.013 kg)    Filed Weights   03/11/15 1052 03/11/15 1654  Weight: 150 lb (68.04 kg) 130 lb 1.6 oz (59.013 kg)   Noted drastically different weights on same day, 150 vs 130 pounds   Wt Readings from Last 10 Encounters:  03/11/15 130 lb 1.6 oz (59.013 kg)  02/16/15 149 lb 8 oz (67.813 kg)  02/16/15 150 lb (68.04 kg)  01/11/15 146 lb 6.2 oz (66.4 kg)  08/23/11 158 lb (71.668 kg)  06/27/07 167 lb (75.751 kg)  05/08/07 159 lb (72.122 kg)  03/28/07 168 lb (76.204 kg)    BMI:  Body mass index is 21.65 kg/(m^2).  Estimated Nutritional Needs:  Kcal:  0938-1829 kcals (BEE 1159, 1.3 AF, 1.0-1.2 IF) using current wt of 59 kg  Protein:  65-77 g (1.1-1.3 g/kg)   Fluid:  1770-2065 mL (30-35 ml/kg)   Skin:   (diabetic ulcer on thigh)  Diet Order:  Diet heart healthy/carb modified  Room service appropriate?: Yes; Fluid consistency:: Thin  EDUCATION NEEDS:   Pt reports she checks her blood sugar weekly at home and usually is between 105-115. Pt reports she does not check her FSBS regularly at home due to monetary issues. Noted pt had been referred to [facility] on previous admissions but has not kept appointments or followed up   Intake/Output Summary (Last 24 hours) at 03/12/15 0957 Last data filed at 03/12/15 0927  Gross per 24 hour  Intake      0 ml  Output    650 ml  Net   -650 ml    Last BM:  7/1  MODERATE Care Level  Kerman Passey MS, RD, LDN [phone]  Pager

## 2015-03-12 NOTE — Progress Notes (Signed)
Patient complained of sharp sternal chest pain radiating down left arm. VS taken B/P 200/95, HR 88, Temp 97.8, RR20 even and un labored, oxygen saturation 100% on RA. Dr. Nadara Mustard paged. Will continue to monitor patient.

## 2015-03-13 LAB — GLUCOSE, CAPILLARY
GLUCOSE-CAPILLARY: 252 mg/dL — AB (ref 65–99)
Glucose-Capillary: 206 mg/dL — ABNORMAL HIGH (ref 65–99)
Glucose-Capillary: 203 mg/dL — ABNORMAL HIGH (ref 65–99)
Glucose-Capillary: 191 mg/dL — ABNORMAL HIGH (ref 65–99)

## 2015-03-13 LAB — CORTISOL: CORTISOL PLASMA: 3.7 ug/dL

## 2015-03-13 MED ORDER — METOPROLOL SUCCINATE ER 50 MG PO TB24
50.0000 mg | ORAL_TABLET | Freq: Every day | ORAL | Status: DC
  Administered 2015-03-14: 50 mg via ORAL
  Filled 2015-03-13 (×2): qty 1

## 2015-03-13 MED ORDER — LISINOPRIL 5 MG PO TABS
5.0000 mg | ORAL_TABLET | Freq: Every day | ORAL | Status: DC
  Administered 2015-03-14: 5 mg via ORAL
  Filled 2015-03-13: qty 1

## 2015-03-13 NOTE — Progress Notes (Signed)
[place] [facility] [place] NAME: [name]    MR#:  [id number]  DATE OF BIRTH:  09-Oct-1955  SUBJECTIVE:  CHIEF COMPLAINT:   Chief Complaint  Patient presents with  . Weakness  . Loss of Consciousness   still orthostatic but clinically asymptomatic. No other complaints.  Overall feels better  REVIEW OF SYSTEMS:    Review of Systems  Constitutional: Negative for fever and chills.  HENT: Negative for congestion and tinnitus.   Eyes: Negative for blurred vision and double vision.  Respiratory: Negative for cough, shortness of breath and wheezing.   Cardiovascular: Negative for chest pain, orthopnea and PND.  Gastrointestinal: Negative for nausea, vomiting, abdominal pain and diarrhea.  Genitourinary: Negative for dysuria and hematuria.  Neurological: Negative for dizziness, sensory change, focal weakness and weakness.  All other systems reviewed and are negative.   Nutrition: Heart Health Tolerating Diet: Yes Tolerating PT: Await Eval.   DRUG ALLERGIES:   Allergies  Allergen Reactions  . Erythromycin Base Other (See Comments)    Fever  . Metformin     REACTION: Loose stools  . Pioglitazone Hcl-Metformin Hcl     REACTION: Diarrhea  . Rosiglitazone Maleate     REACTION: Swelling  . Sitagliptin-Metformin Hcl     REACTION: Rash  . Codeine Sulfate Rash  . Tetanus-Diphtheria Toxoids Td Rash and Other (See Comments)    Fever    VITALS:  Blood pressure 122/67, pulse 93, temperature 98 F (36.7 C), temperature source Oral, resp. rate 18, height 5' 5"  (1.651 m), weight 59.013 kg (130 lb 1.6 oz), SpO2 99 %.  PHYSICAL EXAMINATION:   Physical Exam  GENERAL:  59 y.o.-year-old patient lying in the bed with no acute distress.  EYES: Pupils equal, round, reactive to light and accommodation. No scleral icterus. Extraocular muscles intact.  HEENT: Head atraumatic, normocephalic. Oropharynx and nasopharynx clear.  NECK:  Supple,  no jugular venous distention. No thyroid enlargement, no tenderness.  LUNGS: Normal breath sounds bilaterally, no wheezing, rales, rhonchi. No use of accessory muscles of respiration.  CARDIOVASCULAR: S1, S2 RRR Tachycardic. No murmurs, rubs, or gallops.  ABDOMEN: Soft, nontender, nondistended. Bowel sounds present. No organomegaly or mass.  EXTREMITIES: No cyanosis, clubbing or edema b/l.    NEUROLOGIC: Cranial nerves II through XII are intact. No focal Motor or sensory deficits b/l.   PSYCHIATRIC: The patient is alert and oriented x 3. Good Affect.  SKIN: No obvious rash, lesion, or ulcer.    LABORATORY PANEL:   CBC  Recent Labs Lab 03/12/15 0403  WBC 5.3  HGB 13.4  HCT 40.5  PLT 97*   ------------------------------------------------------------------------------------------------------------------  Chemistries   Recent Labs Lab 03/11/15 1122 03/12/15 0403  NA 134* 141  K 3.9 4.0  CL 100* 111  CO2 25 23  GLUCOSE 275* 246*  BUN 21* 20  CREATININE 1.00 0.98  CALCIUM 9.5 8.5*  AST 41  --   ALT 39  --   ALKPHOS 101  --   BILITOT 2.0*  --    ------------------------------------------------------------------------------------------------------------------  Cardiac Enzymes  Recent Labs Lab 03/11/15 1122  TROPONINI <0.03   ------------------------------------------------------------------------------------------------------------------  RADIOLOGY:  No results found.   ASSESSMENT AND PLAN:   59 year old female with past medical history of diabetes, hypothyroidism, previous history of syncope, anxiety, GERD, previous TIA, DJD, who presented to the hospital due to a syncopal episode.  #1 syncope with collapse-the exact etiology of this is unclear but suspected to be secondary to  orthostasis. -Patient was recently admitted to the hospital and worked up for TIA and had a negative carotid duplex and echo so will not repeat those. On telemetry patient has no evidence  of arrhythmia. -Patient is still orthostatic with clinically asymptomatic. We'll titrate her antihypertensives down. -Appreciate cardiology input and they have no plans for any further intervention. -Cortisol level was normal, TSH mildly elevated question related to euthyroid sick syndrome. We'll check total T4 and free T3. -Await physical therapy evaluation to assess mobility.  #2 type 2 diabetes without complication-continue Tradjenta, sliding scale insulin.  #3 chronic pain-continue MS Contin and Vicodin as needed.  #4 hyperlipidemia-continue atorvastatin.  #5 GERD-continue Protonix.  #6 thrombocytopenia-etiology unclear but seems chronic in nature. No evidence of acute bleeding. We'll DC Lovenox -Outpatient referral to hematology.   All the records are reviewed and case discussed with Care Management/Social Workerr. Management plans discussed with the patient, family and they are in agreement.  CODE STATUS: Full  DVT Prophylaxis: Teds and SCDs  TOTAL TIME TAKING CARE OF THIS PATIENT: 30 minutes.   POSSIBLE D/C home tomorrow if orthostatics improved and patient is clinically asymptomatic  Henreitta Leber M.D on 03/13/2015 at 12:34 PM  Between 7am to 6pm - Pager - [phone]  After 6pm go to [email or web address] - password [email or web address] [facility]  [place] [facility]  Office  [phone]  CC: Primary care physician; Alvester Chou, NP

## 2015-03-13 NOTE — Evaluation (Signed)
Physical Therapy Evaluation Patient Details Name: [name] MRN: [id number] DOB: 03-25-1956 Today's Date: 03/13/2015   History of Present Illness  59 yo female with onset of chest pain and low BP was admitted and PT to assess mobiltiy and safety  Clinical Impression  Pt was seen for evaluation of her functional status with noted difficulty with low BP although fine for short walk.   Her plan is to go hoem with husband tomorrow, and will have HHPT follow up with the strengthening she needs to be safe.  Her Husband is not present to see if he is in good health but pt reports he is.  Will see for practice of gait if needed before discharge.    Follow Up Recommendations Home health PT;Supervision/Assistance - 24 hour    Equipment Recommendations  Rolling walker with 5" wheels (if pt does not have one)    Recommendations for Other Services       Precautions / Restrictions Precautions Precautions: Fall;Other (comment) (telemetry) Restrictions Weight Bearing Restrictions: No      Mobility  Bed Mobility Overal bed mobility: Modified Independent                Transfers Overall transfer level: Modified independent Equipment used: Rolling walker (2 wheeled)                Ambulation/Gait Ambulation/Gait assistance: Supervision;Min guard Ambulation Distance (Feet): 300 Feet Assistive device: Rolling walker (2 wheeled);1 person hand held assist Gait Pattern/deviations: Step-through pattern;Decreased stride length;Narrow base of support;Trunk flexed Gait velocity: normal Gait velocity interpretation: at or above normal speed for age/gender General Gait Details: 300 x 2 walks with BP ck in between with 89/65 reading  Stairs            Wheelchair Mobility    Modified Rankin (Stroke Patients Only)       Balance Overall balance assessment: Modified Independent                                           Pertinent Vitals/Pain Pain  Assessment: Faces Faces Pain Scale: Hurts a little bit Pain Location: back chronic pain Pain Intervention(s): Limited activity within patient's tolerance;Monitored during session    Home Living Family/patient expects to be discharged to:: Private residence Living Arrangements: Spouse/significant other;Children Available Help at Discharge: Family Type of Home: Mobile home Home Access: Ramped entrance     Home Layout: One level Home Equipment: Environmental consultant - 2 wheels;Cane - single point;Shower seat      Prior Function Level of Independence: Independent with assistive device(s)         Comments: Mod indep with SPC for household mobility, RW for community distances; + driving.  1-2 falls in previous six months.     Hand Dominance   Dominant Hand: Right    Extremity/Trunk Assessment   Upper Extremity Assessment: Overall WFL for tasks assessed           Lower Extremity Assessment: Generalized weakness      Cervical / Trunk Assessment: Normal  Communication   Communication: No difficulties  Cognition Arousal/Alertness: Awake/alert Behavior During Therapy: WFL for tasks assessed/performed Overall Cognitive Status: Within Functional Limits for tasks assessed                      General Comments General comments (skin integrity, edema, etc.): Pt is using BR without assist  from PT this afternoon, able to control her balance with no dizziness or other orthostatic symptoms    Exercises        Assessment/Plan    PT Assessment Patient needs continued PT services  PT Diagnosis Generalized weakness   PT Problem List Decreased strength;Decreased range of motion;Decreased activity tolerance;Decreased balance;Decreased mobility;Decreased coordination;Decreased knowledge of use of DME;Cardiopulmonary status limiting activity;Decreased skin integrity  PT Treatment Interventions DME instruction;Gait training;Functional mobility training;Balance training;Therapeutic  exercise;Therapeutic activities;Neuromuscular re-education;Patient/family education   PT Goals (Current goals can be found in the Care Plan section) Acute Rehab PT Goals Patient Stated Goal: to get home today if possible PT Goal Formulation: With patient Time For Goal Achievement: 03/27/15 Potential to Achieve Goals: Good    Frequency Min 2X/week   Barriers to discharge Other (comment) (low BP today) Medical concerns    Co-evaluation               End of Session Equipment Utilized During Treatment: Gait belt Activity Tolerance: Patient tolerated treatment well Patient left: in chair;with call bell/phone within reach;with chair alarm set Nurse Communication: Mobility status         Time: 5993-5701 PT Time Calculation (min) (ACUTE ONLY): 29 min   Charges:   PT Evaluation $Initial PT Evaluation Tier I: 1 Procedure PT Treatments $Gait Training: 8-22 mins   PT G Codes:        Ramond Dial 03/29/2015, 1:25 PM   Mee Hives, PT MS Acute Rehab Dept. Number: [facility] [id number] and [place] [phone]

## 2015-03-13 NOTE — Progress Notes (Signed)
+   orthostatic vs...Dr. Andee Poles is aware..ambulated around nurses station/ tolerated well/ no c/o dizziness

## 2015-03-13 NOTE — Plan of Care (Signed)
Problem: Phase II Progression Outcomes Goal: Progress activity as tolerated unless otherwise ordered Outcome: Progressing Ambulated around nurses station multiple times today/ no c/o dizziness

## 2015-03-13 NOTE — Plan of Care (Signed)
Problem: Phase II Progression Outcomes Goal: Progress activity as tolerated unless otherwise ordered Outcome: Progressing Ambulated around nurses station multiple times today/ no c/o dizziness  Goal: Vital signs remain stable Outcome: Not Met (add Reason) Continues to be + for orthostatic vs

## 2015-03-14 LAB — GLUCOSE, CAPILLARY
GLUCOSE-CAPILLARY: 179 mg/dL — AB (ref 65–99)
GLUCOSE-CAPILLARY: 281 mg/dL — AB (ref 65–99)

## 2015-03-14 LAB — T3, FREE: T3 FREE: 4.3 pg/mL (ref 2.0–4.4)

## 2015-03-14 LAB — T4: T4 TOTAL: 8.5 ug/dL (ref 4.5–12.0)

## 2015-03-14 MED ORDER — LISINOPRIL 10 MG PO TABS: 5.0000 mg | ORAL_TABLET | Freq: Every day | ORAL | Status: DC

## 2015-03-14 MED ORDER — METOPROLOL SUCCINATE ER 50 MG PO TB24: 50.0000 mg | ORAL_TABLET | Freq: Every day | ORAL | Status: DC

## 2015-03-14 NOTE — Progress Notes (Signed)
Pt in NAD, skin warm and dry, VSS, SR per monitor.  IV and telemetry discontinued per policy and procedure.  Discharge instructions and Rx's given to and reviewed with patient and her husband, both verbalized understanding.  Pt discharged home.

## 2015-03-14 NOTE — Care Management (Signed)
Important Message  Patient Details  Name: [name] MRN: [id number] Date of Birth: [birth year]   Medicare Important Message Given:  Yes-second notification given    Alvie Heidelberg, RN 03/14/2015, 11:52 AM

## 2015-03-14 NOTE — Discharge Summary (Signed)
[facility] [place] NAME: [name]    MR#:  [id number]  DATE OF BIRTH:  July 24, 1956  DATE OF ADMISSION:  03/11/2015 ADMITTING PHYSICIAN: Dustin Flock, MD  DATE OF DISCHARGE: 03/14/2015  PRIMARY CARE PHYSICIAN: Alvester Chou, NP    ADMISSION DIAGNOSIS:  Syncope and collapse [R55] Bradycardia [R00.1]  DISCHARGE DIAGNOSIS:  Active Problems:   Syncope and collapse   SECONDARY DIAGNOSIS:   Past Medical History  Diagnosis Date  . Diabetes mellitus   . Hypothyroid   . Degenerative disk disease   . Stomach ulcer   . Diverticulitis   . Syncope 01/2015  . Anxiety   . GERD (gastroesophageal reflux disease)   . History of hiatal hernia   . Cancer     HX OF CANCER OF UTERUS   . TIA (transient ischemic attack)     HOSPITAL COURSE:   59 year old female with past medical history of diabetes, hypothyroidism, DJD, history of previous syncope, anxiety, GERD, previous TIA, who presented to the hospital due to a syncopal episode and noted to be orthostatic.  #1 syncope with collapse-this was likely secondary to orthostasis which still persists but the patient is clinically asymptomatic. -On day of discharge patient's vital signs are still consistent with orthostatic hypotension although she has no symptoms of dizziness or any further episodes of syncope. -Patient had a recent two-dimensional echo and a carotid duplex which were negative therefore they were not repeated during this hospitalization. There is no evidence of acute arrhythmia on telemetry. -Patient was seen by cardiology who did not recommend any further intervention. -Patient also had cortisol level checked which was normal. TSH was mildly elevated although her free T3 and total T4 were negative. -Patient was seen by physical therapy and recommended home health services which is being arranged for her prior to discharge.  #2 type 2 diabetes without complication-continue  Januvia.  #3 chronic pain-continue MS Contin and Vicodin as needed.  #4 hyperlipidemia-continue atorvastatin.  #5 GERD-continue Protonix.  #6 thrombocytopenia-etiology unclear but seems chronic in nature. No evidence of acute bleeding.  -Follow up with hematology as outpatient.  Patient is being discharged home with home health physical therapy, nursing services.  DISCHARGE CONDITIONS:   Stable  CONSULTS OBTAINED:  Treatment Team:  Dustin Flock, MD Yolonda Kida, MD  DRUG ALLERGIES:   Allergies  Allergen Reactions  . Erythromycin Base Other (See Comments)    Fever  . Metformin     REACTION: Loose stools  . Pioglitazone Hcl-Metformin Hcl     REACTION: Diarrhea  . Rosiglitazone Maleate     REACTION: Swelling  . Sitagliptin-Metformin Hcl     REACTION: Rash  . Codeine Sulfate Rash  . Tetanus-Diphtheria Toxoids Td Rash and Other (See Comments)    Fever    DISCHARGE MEDICATIONS:   Current Discharge Medication List    CONTINUE these medications which have CHANGED   Details  lisinopril (PRINIVIL,ZESTRIL) 10 MG tablet Take 0.5 tablets (5 mg total) by mouth daily. Qty: 60 tablet, Refills: 1    metoprolol succinate (TOPROL-XL) 50 MG 24 hr tablet Take 1 tablet (50 mg total) by mouth daily. Qty: 60 tablet, Refills: 1      CONTINUE these medications which have NOT CHANGED   Details  atorvastatin (LIPITOR) 40 MG tablet Take 1 tablet (40 mg total) by mouth daily at 6 PM. Qty: 30 tablet, Refills: 0    HYDROcodone-acetaminophen (NORCO/VICODIN) 5-325 MG per tablet Take 1 tablet by  mouth every 6 (six) hours as needed for moderate pain.    JANUVIA 100 MG tablet Take 100 mg by mouth daily.    morphine (MS CONTIN) 100 MG 12 hr tablet Take 100 mg by mouth 2 (two) times daily.     pantoprazole (PROTONIX) 20 MG tablet Take 1 tablet by mouth 2 (two) times daily.    tiZANidine (ZANAFLEX) 4 MG tablet Take 1 tablet by mouth every 6 (six) hours as needed for muscle spasms.      zolpidem (AMBIEN) 5 MG tablet Take 1 tablet by mouth at bedtime.         DISCHARGE INSTRUCTIONS:   DIET:  Cardiac diet  DISCHARGE CONDITION:  Stable  ACTIVITY:  Activity as tolerated  OXYGEN:  Home Oxygen: No.   Oxygen Delivery: room air  DISCHARGE LOCATION:  Home with home health physical therapy and nursing.   If you experience worsening of your admission symptoms, develop shortness of breath, life threatening emergency, suicidal or homicidal thoughts you must seek medical attention immediately by calling 911 or calling your MD immediately  if symptoms less severe.  You Must read complete instructions/literature along with all the possible adverse reactions/side effects for all the Medicines you take and that have been prescribed to you. Take any new Medicines after you have completely understood and accpet all the possible adverse reactions/side effects.   Please note  You were cared for by a hospitalist during your hospital stay. If you have any questions about your discharge medications or the care you received while you were in the hospital after you are discharged, you can call the unit and asked to speak with the hospitalist on call if the hospitalist that took care of you is not available. Once you are discharged, your primary care physician will handle any further medical issues. Please note that NO REFILLS for any discharge medications will be authorized once you are discharged, as it is imperative that you return to your primary care physician (or establish a relationship with a primary care physician if you do not have one) for your aftercare needs so that they can reassess your need for medications and monitor your lab values.     Today   Patient denies any complaints, no episodes of syncope overnight. Still remains orthostatic by numbers but clinically asymptomatic.  VITAL SIGNS:  Blood pressure 140/72, pulse 85, temperature 98 F (36.7 C), temperature  source Oral, resp. rate 18, height 5' 5"  (1.651 m), weight 59.013 kg (130 lb 1.6 oz), SpO2 93 %.  I/O:   Intake/Output Summary (Last 24 hours) at 03/14/15 1206 Last data filed at 03/14/15 1031  Gross per 24 hour  Intake    120 ml  Output    975 ml  Net   -855 ml    PHYSICAL EXAMINATION:  GENERAL:  59 y.o.-year-old patient lying in the bed with no acute distress.  EYES: Pupils equal, round, reactive to light and accommodation. No scleral icterus. Extraocular muscles intact.  HEENT: Head atraumatic, normocephalic. Oropharynx and nasopharynx clear.  NECK:  Supple, no jugular venous distention. No thyroid enlargement, no tenderness.  LUNGS: Normal breath sounds bilaterally, no wheezing, rales,rhonchi. No use of accessory muscles of respiration.  CARDIOVASCULAR: S1, S2 RRR. No murmurs, rubs, or gallops.  ABDOMEN: Soft, non-tender, non-distended. Bowel sounds present. No organomegaly or mass.  EXTREMITIES: No pedal edema, cyanosis, or clubbing.  NEUROLOGIC: Cranial nerves II through XII are intact. No focal motor or sensory defecits b/l.  PSYCHIATRIC: The  patient is alert and oriented x 3. Good affect.  SKIN: No obvious rash, lesion, or ulcer.   DATA REVIEW:   CBC  Recent Labs Lab 03/12/15 0403  WBC 5.3  HGB 13.4  HCT 40.5  PLT 97*    Chemistries   Recent Labs Lab 03/11/15 1122 03/12/15 0403  NA 134* 141  K 3.9 4.0  CL 100* 111  CO2 25 23  GLUCOSE 275* 246*  BUN 21* 20  CREATININE 1.00 0.98  CALCIUM 9.5 8.5*  AST 41  --   ALT 39  --   ALKPHOS 101  --   BILITOT 2.0*  --     Cardiac Enzymes  Recent Labs Lab 03/11/15 1122  TROPONINI <0.03    Microbiology Results  Results for orders placed or performed in visit on 10/08/14  Culture, blood (single)     Status: None   Collection Time: 10/08/14 12:22 AM  Result Value Ref Range Status   Micro Text Report   Final       COMMENT                   NO GROWTH AEROBICALLY/ANAEROBICALLY IN 5 DAYS   ANTIBIOTIC                                                       Culture, blood (single)     Status: None   Collection Time: 10/08/14  1:35 AM  Result Value Ref Range Status   Micro Text Report   Final       COMMENT                   NO GROWTH AEROBICALLY/ANAEROBICALLY IN 5 DAYS   ANTIBIOTIC                                                        RADIOLOGY:  No results found.    Management plans discussed with the patient, family and they are in agreement.  CODE STATUS:     Code Status Orders        Start     Ordered   03/11/15 1650  Full code   Continuous     03/11/15 1649      TOTAL TIME TAKING CARE OF THIS PATIENT: 40 minutes.    Henreitta Leber M.D on 03/14/2015 at 12:06 PM  Between 7am to 6pm - Pager - [phone]  After 6pm go to [email or web address] - password [email or web address] [facility]  [facility]  Office  [phone]  CC: Primary care physician; Alvester Chou, NP

## 2015-03-14 NOTE — Care Management (Signed)
Spoke with patient for discharge planning ans she stated that she is already open to [place]. Attempted to contact Sharrie Rothman at [facility] and also main after hours and holiday number left voicemail. Patient stated that [facility] is aware of her admission and discharge. Patient ambulating well in the room without assistance.

## 2015-03-14 NOTE — Discharge Instructions (Signed)
°  DIET:  Cardiac diet  DISCHARGE CONDITION:  Stable  ACTIVITY:  Activity as tolerated  OXYGEN:  Home Oxygen: No.   Oxygen Delivery: None  DISCHARGE LOCATION:  Home with Home health RN/PT.    If you experience worsening of your admission symptoms, develop shortness of breath, life threatening emergency, suicidal or homicidal thoughts you must seek medical attention immediately by calling 911 or calling your MD immediately  if symptoms less severe.  You Must read complete instructions/literature along with all the possible adverse reactions/side effects for all the Medicines you take and that have been prescribed to you. Take any new Medicines after you have completely understood and accpet all the possible adverse reactions/side effects.   Please note  You were cared for by a hospitalist during your hospital stay. If you have any questions about your discharge medications or the care you received while you were in the hospital after you are discharged, you can call the unit and asked to speak with the hospitalist on call if the hospitalist that took care of you is not available. Once you are discharged, your primary care physician will handle any further medical issues. Please note that NO REFILLS for any discharge medications will be authorized once you are discharged, as it is imperative that you return to your primary care physician (or establish a relationship with a primary care physician if you do not have one) for your aftercare needs so that they can reassess your need for medications and monitor your lab values.

## 2015-03-14 NOTE — Progress Notes (Signed)
Pt. Ambulated multiple times during the night without any dizziness, weakness or SOB noted. She ambulated multiple laps to elevators and back during each session. She did not require any PRN nausea medication nor pain medication during the night. The patient was in good spirits all night. Will continue to monitor patient

## 2015-03-15 NOTE — Care Management (Signed)
Patient was not open to [employer] [facility]. Patient was open to [facility]. Contacted Danny Lawless at [facility] who stated that they had attempted to see the patient but that she would not answer calls.  Multiple call placed to patient contact number [phone] unable to reach patient.  No voicemail.

## 2015-03-18 NOTE — Progress Notes (Signed)
Subjective:   patient denies further syncope still complains of weakness denies any chest pain  Objective:  Vital Signs in the last 24 hours:    Intake/Output from previous day:   Intake/Output from this shift:    Physical Exam: General appearance: alert, cooperative and appears stated age Neck: no adenopathy, no carotid bruit, no JVD, supple, symmetrical, trachea midline and thyroid not enlarged, symmetric, no tenderness/mass/nodules Lungs: clear to auscultation bilaterally Heart: regular rate and rhythm, S1, S2 normal, no murmur, click, rub or gallop Abdomen: soft, non-tender; bowel sounds normal; no masses,  no organomegaly Extremities: extremities normal, atraumatic, no cyanosis or edema Pulses: 2+ and symmetric Skin: Skin color, texture, turgor normal. No rashes or lesions Neurologic: Alert and oriented X 3, normal strength and tone. Normal symmetric reflexes. Normal coordination and gait  Lab Results: No results for input(s): WBC, HGB, PLT in the last 72 hours. No results for input(s): NA, K, CL, CO2, GLUCOSE, BUN, CREATININE in the last 72 hours. No results for input(s): TROPONINI in the last 72 hours.  Invalid input(s): CK, MB Hepatic Function Panel No results for input(s): PROT, ALBUMIN, AST, ALT, ALKPHOS, BILITOT, BILIDIR, IBILI in the last 72 hours. No results for input(s): CHOL in the last 72 hours. No results for input(s): PROTIME in the last 72 hours.  Imaging: Imaging results have been reviewed  Cardiac Studies:  Assessment/Plan:  Syncope   hypertension  GERD  diabetes  hyperlipidemia  CVA . PLAN  continue telemetry  agree with echocardiogram for further assessment  continue hypertension control  recommend continue Protonix for GERD  diabetes continue current control with Januvia insulin  consider Neurology evaluation  recommend outpatient cardiology workup  LOS: 3 days    CALLWOOD,DWAYNE D. 03/18/2015, 1:10 PM

## 2015-04-03 ENCOUNTER — Emergency Department: Payer: Medicare HMO

## 2015-04-03 ENCOUNTER — Inpatient Hospital Stay
Admission: EM | Admit: 2015-04-03 | Discharge: 2015-04-07 | DRG: 391 | Disposition: A | Discharge status: Home / Self Care | Payer: Medicare HMO | Attending: Internal Medicine | Admitting: Internal Medicine

## 2015-04-03 ENCOUNTER — Encounter: Payer: Self-pay | Admitting: Emergency Medicine

## 2015-04-03 DIAGNOSIS — E785 Hyperlipidemia, unspecified: Secondary | ICD-10-CM | POA: Diagnosis present

## 2015-04-03 DIAGNOSIS — I471 Supraventricular tachycardia: Secondary | ICD-10-CM | POA: Diagnosis present

## 2015-04-03 DIAGNOSIS — Z79891 Long term (current) use of opiate analgesic: Secondary | ICD-10-CM

## 2015-04-03 DIAGNOSIS — I10 Essential (primary) hypertension: Secondary | ICD-10-CM | POA: Diagnosis present

## 2015-04-03 DIAGNOSIS — K7460 Unspecified cirrhosis of liver: Secondary | ICD-10-CM | POA: Diagnosis present

## 2015-04-03 DIAGNOSIS — M545 Low back pain: Secondary | ICD-10-CM | POA: Diagnosis present

## 2015-04-03 DIAGNOSIS — E1065 Type 1 diabetes mellitus with hyperglycemia: Secondary | ICD-10-CM | POA: Diagnosis present

## 2015-04-03 DIAGNOSIS — K2950 Unspecified chronic gastritis without bleeding: Principal | ICD-10-CM | POA: Diagnosis present

## 2015-04-03 DIAGNOSIS — R1310 Dysphagia, unspecified: Secondary | ICD-10-CM | POA: Diagnosis present

## 2015-04-03 DIAGNOSIS — Z8711 Personal history of peptic ulcer disease: Secondary | ICD-10-CM

## 2015-04-03 DIAGNOSIS — Z8673 Personal history of transient ischemic attack (TIA), and cerebral infarction without residual deficits: Secondary | ICD-10-CM

## 2015-04-03 DIAGNOSIS — Z79899 Other long term (current) drug therapy: Secondary | ICD-10-CM

## 2015-04-03 DIAGNOSIS — I248 Other forms of acute ischemic heart disease: Secondary | ICD-10-CM | POA: Diagnosis present

## 2015-04-03 DIAGNOSIS — Z6823 Body mass index (BMI) 23.0-23.9, adult: Secondary | ICD-10-CM

## 2015-04-03 DIAGNOSIS — Z90710 Acquired absence of both cervix and uterus: Secondary | ICD-10-CM

## 2015-04-03 DIAGNOSIS — Z888 Allergy status to other drugs, medicaments and biological substances status: Secondary | ICD-10-CM

## 2015-04-03 DIAGNOSIS — I4891 Unspecified atrial fibrillation: Secondary | ICD-10-CM | POA: Diagnosis present

## 2015-04-03 DIAGNOSIS — Z8542 Personal history of malignant neoplasm of other parts of uterus: Secondary | ICD-10-CM

## 2015-04-03 DIAGNOSIS — E109 Type 1 diabetes mellitus without complications: Secondary | ICD-10-CM | POA: Diagnosis present

## 2015-04-03 DIAGNOSIS — R1013 Epigastric pain: Secondary | ICD-10-CM

## 2015-04-03 DIAGNOSIS — R079 Chest pain, unspecified: Secondary | ICD-10-CM

## 2015-04-03 DIAGNOSIS — Z794 Long term (current) use of insulin: Secondary | ICD-10-CM

## 2015-04-03 DIAGNOSIS — Z823 Family history of stroke: Secondary | ICD-10-CM

## 2015-04-03 DIAGNOSIS — R112 Nausea with vomiting, unspecified: Secondary | ICD-10-CM

## 2015-04-03 DIAGNOSIS — Z9049 Acquired absence of other specified parts of digestive tract: Secondary | ICD-10-CM | POA: Diagnosis present

## 2015-04-03 DIAGNOSIS — K8020 Calculus of gallbladder without cholecystitis without obstruction: Secondary | ICD-10-CM | POA: Diagnosis present

## 2015-04-03 DIAGNOSIS — Z8249 Family history of ischemic heart disease and other diseases of the circulatory system: Secondary | ICD-10-CM

## 2015-04-03 DIAGNOSIS — K5900 Constipation, unspecified: Secondary | ICD-10-CM | POA: Diagnosis present

## 2015-04-03 DIAGNOSIS — R1110 Vomiting, unspecified: Secondary | ICD-10-CM

## 2015-04-03 DIAGNOSIS — L89899 Pressure ulcer of other site, unspecified stage: Secondary | ICD-10-CM | POA: Diagnosis present

## 2015-04-03 DIAGNOSIS — K219 Gastro-esophageal reflux disease without esophagitis: Secondary | ICD-10-CM | POA: Diagnosis present

## 2015-04-03 DIAGNOSIS — K3184 Gastroparesis: Secondary | ICD-10-CM | POA: Diagnosis present

## 2015-04-03 DIAGNOSIS — R109 Unspecified abdominal pain: Secondary | ICD-10-CM | POA: Diagnosis present

## 2015-04-03 DIAGNOSIS — E43 Unspecified severe protein-calorie malnutrition: Secondary | ICD-10-CM | POA: Diagnosis present

## 2015-04-03 DIAGNOSIS — E876 Hypokalemia: Secondary | ICD-10-CM | POA: Diagnosis present

## 2015-04-03 DIAGNOSIS — Z9889 Other specified postprocedural states: Secondary | ICD-10-CM

## 2015-04-03 DIAGNOSIS — G8929 Other chronic pain: Secondary | ICD-10-CM | POA: Diagnosis present

## 2015-04-03 DIAGNOSIS — E039 Hypothyroidism, unspecified: Secondary | ICD-10-CM | POA: Diagnosis present

## 2015-04-03 HISTORY — DX: Type 1 diabetes mellitus without complications: E10.9

## 2015-04-03 HISTORY — DX: Acute pancreatitis without necrosis or infection, unspecified: K85.90

## 2015-04-03 HISTORY — DX: Essential (primary) hypertension: I10

## 2015-04-03 LAB — GLUCOSE, CAPILLARY
GLUCOSE-CAPILLARY: 266 mg/dL — AB (ref 65–99)
GLUCOSE-CAPILLARY: 233 mg/dL — AB (ref 65–99)
Glucose-Capillary: 246 mg/dL — ABNORMAL HIGH (ref 65–99)

## 2015-04-03 LAB — COMPREHENSIVE METABOLIC PANEL
ALT: 44 U/L (ref 14–54)
ANION GAP: 10 (ref 5–15)
AST: 52 U/L — AB (ref 15–41)
Albumin: 4.1 g/dL (ref 3.5–5.0)
Alkaline Phosphatase: 93 U/L (ref 38–126)
BUN: 25 mg/dL — ABNORMAL HIGH (ref 6–20)
CO2: 26 mmol/L (ref 22–32)
Calcium: 8.9 mg/dL (ref 8.9–10.3)
Chloride: 101 mmol/L (ref 101–111)
Creatinine, Ser: 1.03 mg/dL — ABNORMAL HIGH (ref 0.44–1.00)
GFR calc Af Amer: >60 mL/min (ref >60)
GFR calc non Af Amer: 57 mL/min — ABNORMAL LOW (ref >60)
Glucose, Bld: 413 mg/dL — ABNORMAL HIGH (ref 65–99)
Potassium: 3.8 mmol/L (ref 3.5–5.1)
Sodium: 137 mmol/L (ref 135–145)
Total Bilirubin: 2 mg/dL — ABNORMAL HIGH (ref 0.3–1.2)
Total Protein: 7.7 g/dL (ref 6.5–8.1)

## 2015-04-03 LAB — URINALYSIS COMPLETE WITH MICROSCOPIC (ARMC ONLY)
Bacteria, UA: NONE SEEN
Bilirubin Urine: NEGATIVE
KETONES UR: NEGATIVE mg/dL
Leukocytes, UA: NEGATIVE
NITRITE: NEGATIVE
Protein, ur: 100 mg/dL — AB
SPECIFIC GRAVITY, URINE: 1.021 (ref 1.005–1.030)
pH: 6 (ref 5.0–8.0)

## 2015-04-03 LAB — CBC WITH DIFFERENTIAL/PLATELET
BASOS PCT: 0 %
Basophils Absolute: 0 10*3/uL (ref 0–0.1)
Eosinophils Absolute: 0 10*3/uL (ref 0–0.7)
Eosinophils Relative: 0 %
HEMATOCRIT: 40.6 % (ref 35.0–47.0)
HEMOGLOBIN: 13.6 g/dL (ref 12.0–16.0)
LYMPHS PCT: 6 %
Lymphs Abs: 0.7 10*3/uL — ABNORMAL LOW (ref 1.0–3.6)
MCH: 31.8 pg (ref 26.0–34.0)
MCHC: 33.3 g/dL (ref 32.0–36.0)
MCV: 95.5 fL (ref 80.0–100.0)
MONO ABS: 0.6 10*3/uL (ref 0.2–0.9)
Monocytes Relative: 5 %
Neutro Abs: 10.2 10*3/uL — ABNORMAL HIGH (ref 1.4–6.5)
Neutrophils Relative %: 89 %
Platelets: 151 10*3/uL (ref 150–440)
RBC: 4.26 MIL/uL (ref 3.80–5.20)
RDW: 16.4 % — ABNORMAL HIGH (ref 11.5–14.5)
WBC: 11.6 10*3/uL — AB (ref 3.6–11.0)

## 2015-04-03 LAB — LIPASE, BLOOD: LIPASE: 16 U/L — AB (ref 22–51)

## 2015-04-03 LAB — TROPONIN I
Troponin I: 0.03 ng/mL (ref <0.031)
Troponin I: 0.05 ng/mL — ABNORMAL HIGH (ref <0.031)
Troponin I: 0.05 ng/mL — ABNORMAL HIGH (ref <0.031)

## 2015-04-03 MED ORDER — ACETAMINOPHEN 325 MG PO TABS: 650.0000 mg | ORAL_TABLET | Freq: Four times a day (QID) | ORAL | Status: DC | PRN

## 2015-04-03 MED ORDER — ENOXAPARIN SODIUM 40 MG/0.4ML ~~LOC~~ SOLN
40.0000 mg | SUBCUTANEOUS | Status: DC
  Administered 2015-04-03 – 2015-04-05 (×2): 40 mg via SUBCUTANEOUS
  Filled 2015-04-03 (×2): qty 0.4

## 2015-04-03 MED ORDER — INSULIN ASPART 100 UNIT/ML ~~LOC~~ SOLN
0.0000 [IU] | Freq: Every day | SUBCUTANEOUS | Status: DC
  Administered 2015-04-03: 2 [IU] via SUBCUTANEOUS
  Administered 2015-04-05: 4 [IU] via SUBCUTANEOUS
  Administered 2015-04-06: 3 [IU] via SUBCUTANEOUS
  Filled 2015-04-03: qty 2
  Filled 2015-04-03: qty 4
  Filled 2015-04-03: qty 3

## 2015-04-03 MED ORDER — MORPHINE SULFATE 4 MG/ML IJ SOLN
4.0000 mg | Freq: Once | INTRAMUSCULAR | Status: AC
  Administered 2015-04-03: 4 mg via INTRAVENOUS
  Filled 2015-04-03: qty 1

## 2015-04-03 MED ORDER — HYDRALAZINE HCL 25 MG PO TABS
25.0000 mg | ORAL_TABLET | Freq: Three times a day (TID) | ORAL | Status: DC
  Administered 2015-04-03 – 2015-04-06 (×7): 25 mg via ORAL
  Filled 2015-04-03 (×7): qty 1

## 2015-04-03 MED ORDER — ACETAMINOPHEN 650 MG RE SUPP: 650.0000 mg | Freq: Four times a day (QID) | RECTAL | Status: DC | PRN

## 2015-04-03 MED ORDER — MORPHINE SULFATE ER 100 MG PO TBCR
100.0000 mg | EXTENDED_RELEASE_TABLET | Freq: Two times a day (BID) | ORAL | Status: DC
  Administered 2015-04-03 – 2015-04-07 (×7): 100 mg via ORAL
  Filled 2015-04-03 (×8): qty 1

## 2015-04-03 MED ORDER — PROMETHAZINE HCL 25 MG/ML IJ SOLN
12.5000 mg | Freq: Once | INTRAMUSCULAR | Status: AC
  Administered 2015-04-03: 12.5 mg via INTRAVENOUS
  Filled 2015-04-03: qty 1

## 2015-04-03 MED ORDER — METOPROLOL SUCCINATE ER 50 MG PO TB24
50.0000 mg | ORAL_TABLET | Freq: Every day | ORAL | Status: DC
  Filled 2015-04-03: qty 1

## 2015-04-03 MED ORDER — HYDROCODONE-ACETAMINOPHEN 5-325 MG PO TABS
1.0000 | ORAL_TABLET | Freq: Four times a day (QID) | ORAL | Status: DC | PRN
  Administered 2015-04-04 (×2): 1 via ORAL
  Filled 2015-04-03 (×2): qty 1

## 2015-04-03 MED ORDER — SODIUM CHLORIDE 0.9 % IV SOLN
INTRAVENOUS | Status: DC
  Administered 2015-04-03 – 2015-04-07 (×7): via INTRAVENOUS

## 2015-04-03 MED ORDER — HYDRALAZINE HCL 20 MG/ML IJ SOLN
10.0000 mg | Freq: Four times a day (QID) | INTRAMUSCULAR | Status: DC | PRN
  Administered 2015-04-05: 10 mg via INTRAVENOUS
  Filled 2015-04-03 (×2): qty 1

## 2015-04-03 MED ORDER — IOHEXOL 240 MG/ML SOLN: 25.0000 mL | Freq: Once | INTRAMUSCULAR | Status: AC | PRN

## 2015-04-03 MED ORDER — PROMETHAZINE HCL 25 MG/ML IJ SOLN
INTRAMUSCULAR | Status: AC
  Filled 2015-04-03: qty 1

## 2015-04-03 MED ORDER — SODIUM CHLORIDE 0.9 % IV BOLUS (SEPSIS)
1000.0000 mL | Freq: Once | INTRAVENOUS | Status: AC
  Administered 2015-04-03: 1000 mL via INTRAVENOUS

## 2015-04-03 MED ORDER — ONDANSETRON HCL 4 MG PO TABS
4.0000 mg | ORAL_TABLET | Freq: Four times a day (QID) | ORAL | Status: DC | PRN
  Filled 2015-04-03: qty 1

## 2015-04-03 MED ORDER — TIZANIDINE HCL 4 MG PO TABS: 4.0000 mg | ORAL_TABLET | Freq: Four times a day (QID) | ORAL | Status: DC | PRN

## 2015-04-03 MED ORDER — PANTOPRAZOLE SODIUM 40 MG IV SOLR
40.0000 mg | Freq: Two times a day (BID) | INTRAVENOUS | Status: DC
  Administered 2015-04-03 – 2015-04-07 (×8): 40 mg via INTRAVENOUS
  Filled 2015-04-03 (×8): qty 40

## 2015-04-03 MED ORDER — DICYCLOMINE HCL 10 MG PO CAPS
10.0000 mg | ORAL_CAPSULE | Freq: Four times a day (QID) | ORAL | Status: DC
  Administered 2015-04-03 – 2015-04-07 (×14): 10 mg via ORAL
  Filled 2015-04-03 (×22): qty 1

## 2015-04-03 MED ORDER — IOHEXOL 300 MG/ML  SOLN
100.0000 mL | Freq: Once | INTRAMUSCULAR | Status: AC | PRN
Start: 2015-04-03 — End: 2015-04-03
  Administered 2015-04-03: 100 mL via INTRAVENOUS

## 2015-04-03 MED ORDER — INSULIN ASPART 100 UNIT/ML ~~LOC~~ SOLN
6.0000 [IU] | Freq: Once | SUBCUTANEOUS | Status: AC
  Administered 2015-04-03: 6 [IU] via SUBCUTANEOUS
  Filled 2015-04-03: qty 6

## 2015-04-03 MED ORDER — POLYETHYLENE GLYCOL 3350 17 G PO PACK: 17.0000 g | PACK | Freq: Every day | ORAL | Status: DC | PRN

## 2015-04-03 MED ORDER — METOPROLOL TARTRATE 1 MG/ML IV SOLN
5.0000 mg | Freq: Once | INTRAVENOUS | Status: AC
  Administered 2015-04-03: 5 mg via INTRAVENOUS

## 2015-04-03 MED ORDER — ATORVASTATIN CALCIUM 20 MG PO TABS
40.0000 mg | ORAL_TABLET | Freq: Every day | ORAL | Status: DC
  Administered 2015-04-03 – 2015-04-06 (×4): 40 mg via ORAL
  Filled 2015-04-03 (×4): qty 2

## 2015-04-03 MED ORDER — SODIUM CHLORIDE 0.9 % IJ SOLN
3.0000 mL | Freq: Two times a day (BID) | INTRAMUSCULAR | Status: DC
  Administered 2015-04-05 – 2015-04-07 (×3): 3 mL via INTRAVENOUS

## 2015-04-03 MED ORDER — PROMETHAZINE HCL 25 MG/ML IJ SOLN
25.0000 mg | Freq: Once | INTRAMUSCULAR | Status: AC
  Administered 2015-04-03: 25 mg via INTRAVENOUS
  Filled 2015-04-03: qty 1

## 2015-04-03 MED ORDER — ZOLPIDEM TARTRATE 5 MG PO TABS
5.0000 mg | ORAL_TABLET | Freq: Every day | ORAL | Status: DC
  Administered 2015-04-03 – 2015-04-05 (×2): 5 mg via ORAL
  Filled 2015-04-03 (×3): qty 1

## 2015-04-03 MED ORDER — ONDANSETRON HCL 4 MG/2ML IJ SOLN
4.0000 mg | Freq: Once | INTRAMUSCULAR | Status: AC
  Administered 2015-04-03: 4 mg via INTRAVENOUS
  Filled 2015-04-03: qty 2

## 2015-04-03 MED ORDER — CITALOPRAM HYDROBROMIDE 20 MG PO TABS
20.0000 mg | ORAL_TABLET | Freq: Every day | ORAL | Status: DC
  Administered 2015-04-03 – 2015-04-07 (×4): 20 mg via ORAL
  Filled 2015-04-03 (×4): qty 1

## 2015-04-03 MED ORDER — ONDANSETRON HCL 4 MG/2ML IJ SOLN
4.0000 mg | Freq: Four times a day (QID) | INTRAMUSCULAR | Status: DC | PRN
  Administered 2015-04-03 – 2015-04-05 (×4): 4 mg via INTRAVENOUS
  Filled 2015-04-03 (×6): qty 2

## 2015-04-03 MED ORDER — PROMETHAZINE HCL 25 MG/ML IJ SOLN
25.0000 mg | Freq: Once | INTRAMUSCULAR | Status: AC
  Administered 2015-04-03: 25 mg via INTRAVENOUS

## 2015-04-03 MED ORDER — HYDRALAZINE HCL 20 MG/ML IJ SOLN
10.0000 mg | Freq: Four times a day (QID) | INTRAMUSCULAR | Status: DC | PRN
  Administered 2015-04-03: 10 mg via INTRAVENOUS
  Filled 2015-04-03: qty 1

## 2015-04-03 MED ORDER — INSULIN ASPART 100 UNIT/ML ~~LOC~~ SOLN
0.0000 [IU] | Freq: Three times a day (TID) | SUBCUTANEOUS | Status: DC
  Administered 2015-04-03 – 2015-04-04 (×2): 5 [IU] via SUBCUTANEOUS
  Administered 2015-04-05: 3 [IU] via SUBCUTANEOUS
  Administered 2015-04-05 (×2): 7 [IU] via SUBCUTANEOUS
  Administered 2015-04-06: 3 [IU] via SUBCUTANEOUS
  Administered 2015-04-06: 2 [IU] via SUBCUTANEOUS
  Administered 2015-04-06: 7 [IU] via SUBCUTANEOUS
  Administered 2015-04-07: 2 [IU] via SUBCUTANEOUS
  Administered 2015-04-07: 5 [IU] via SUBCUTANEOUS
  Filled 2015-04-03: qty 5
  Filled 2015-04-03: qty 3
  Filled 2015-04-03: qty 7
  Filled 2015-04-03: qty 9
  Filled 2015-04-03: qty 3
  Filled 2015-04-03 (×2): qty 2
  Filled 2015-04-03 (×2): qty 7
  Filled 2015-04-03: qty 5

## 2015-04-03 MED ORDER — ALUM & MAG HYDROXIDE-SIMETH 200-200-20 MG/5ML PO SUSP: 30.0000 mL | Freq: Four times a day (QID) | ORAL | Status: DC | PRN

## 2015-04-03 MED ORDER — MORPHINE SULFATE 2 MG/ML IJ SOLN
2.0000 mg | INTRAMUSCULAR | Status: DC | PRN
  Administered 2015-04-03 – 2015-04-05 (×7): 2 mg via INTRAVENOUS
  Filled 2015-04-03 (×7): qty 1

## 2015-04-03 MED ORDER — METOPROLOL SUCCINATE ER 50 MG PO TB24
50.0000 mg | ORAL_TABLET | Freq: Every day | ORAL | Status: DC
  Administered 2015-04-03: 50 mg via ORAL
  Filled 2015-04-03: qty 1

## 2015-04-03 MED ORDER — LISINOPRIL 5 MG PO TABS
5.0000 mg | ORAL_TABLET | Freq: Every day | ORAL | Status: DC
  Administered 2015-04-03 – 2015-04-07 (×2): 5 mg via ORAL
  Filled 2015-04-03 (×2): qty 1

## 2015-04-03 MED ORDER — METOPROLOL TARTRATE 1 MG/ML IV SOLN
INTRAVENOUS | Status: AC
Start: 2015-04-03 — End: 2015-04-03
  Filled 2015-04-03: qty 5

## 2015-04-03 NOTE — Progress Notes (Signed)
Pt admitted from ED to Room 212. Report received from [employer] Pt A&O x4. No signs of acute distress.   Pt hypertensive in ED. Upon admission to the floor pt continues to be hypertensive. MD aware. See VS flow sheet. Scheduled/home medications administered. Pt asymptomatic at this time. On telemetry.  Iv access intact & infusing. Medications administered as ordered (See MAR). Admission & Assessment completed.   Oriented pt to room. Education provided on call bell, telephone, IVpole/IV alarms. Education presented on white board as a pt/nurse Arbor[employer]. White board completed and updated as needed.  Reviewed diet/medication orders with pt.

## 2015-04-03 NOTE — ED Notes (Signed)
Pt arrived with ems right hand 20g iv. Pt states int is painful to touch, swelling and bruising noted around site and above site. Int removed intact. Ice pack applied to hand to improve swelling and pain.

## 2015-04-03 NOTE — ED Provider Notes (Signed)
[facility] Emergency Department Provider Note  Time seen: 1:06 AM  I have reviewed the triage vital signs and the nursing notes.   HISTORY  Chief Complaint Abdominal Pain; Emesis; and Nausea    HPI [name] is a 59 y.o. female with a past medical history of diabetes, diverticulitis, pancreatitis, mini strokes, hypertension who presents the emergency department for 3 days of worsening abdominal pain, nausea and vomiting. According to the patient for the past 3 days she's had upper abdominal pain which she describes as a burning sensation moderate in severity. She states it has progressively worsened. It is associated with nausea and vomiting. Denies any diarrhea, denies any black or bloody stool or vomit. Denies any fever. Patient states it feels somewhat similar to prior episodes of pancreatitis. Worse when eating food.     Past Medical History  Diagnosis Date  . Diabetes mellitus   . Hypothyroid   . Degenerative disk disease   . Stomach ulcer   . Diverticulitis   . Syncope 01/2015  . Anxiety   . GERD (gastroesophageal reflux disease)   . History of hiatal hernia   . Cancer     HX OF CANCER OF UTERUS   . TIA (transient ischemic attack)   . Hypertension   . Pancreatitis     Patient Active Problem List   Diagnosis Date Noted  . Syncope and collapse 03/11/2015  . CVA (cerebral infarction) 02/15/2015  . Nausea with vomiting 01/12/2015  . Dehydration 01/12/2015  . Essential hypertension 01/12/2015  . Orthostatic hypotension 01/12/2015  . Chronically on opiate therapy 01/12/2015  . Gastroparesis 01/12/2015  . Orthostatic syncope 01/11/2015  . DEPRESSION/ANXIETY 06/27/2007  . MYOFASCIAL PAIN SYNDROME 06/27/2007  . Chronic pain syndrome 03/28/2007  . DM2 (diabetes mellitus, type 2) 03/27/2007  . GERD 03/27/2007  . DIVERTICULOSIS, COLON 03/27/2007  . LUMBAR DISC DISPLACEMENT 03/27/2007  . PROTEINURIA 03/27/2007  . UTERINE CANCER, HX OF  03/27/2007    Past Surgical History  Procedure Laterality Date  . Hernia repair    . Abdominal hysterectomy    . Cholecystectomy      Current Outpatient Rx  Name  Route  Sig  Dispense  Refill  . atorvastatin (LIPITOR) 40 MG tablet   Oral   Take 1 tablet (40 mg total) by mouth daily at 6 PM.   30 tablet   0   . HYDROcodone-acetaminophen (NORCO/VICODIN) 5-325 MG per tablet   Oral   Take 1 tablet by mouth every 6 (six) hours as needed for moderate pain.         Marland Kitchen JANUVIA 100 MG tablet   Oral   Take 100 mg by mouth daily.           Dispense as written.   Marland Kitchen lisinopril (PRINIVIL,ZESTRIL) 10 MG tablet   Oral   Take 0.5 tablets (5 mg total) by mouth daily.   60 tablet   1   . metoprolol succinate (TOPROL-XL) 50 MG 24 hr tablet   Oral   Take 1 tablet (50 mg total) by mouth daily.   60 tablet   1   . morphine (MS CONTIN) 100 MG 12 hr tablet   Oral   Take 100 mg by mouth 2 (two) times daily.          . pantoprazole (PROTONIX) 20 MG tablet   Oral   Take 1 tablet by mouth 2 (two) times daily.         Marland Kitchen tiZANidine (  ZANAFLEX) 4 MG tablet   Oral   Take 1 tablet by mouth every 6 (six) hours as needed for muscle spasms.          Marland Kitchen zolpidem (AMBIEN) 5 MG tablet   Oral   Take 1 tablet by mouth at bedtime.           Allergies Erythromycin base; Metformin; Pioglitazone hcl-metformin hcl; Rosiglitazone maleate; Sitagliptin-metformin hcl; Codeine sulfate; and Tetanus-diphtheria toxoids td  Family History  Problem Relation Age of Onset  . Hypertension Mother   . CAD Sister     Social History History  Substance Use Topics  . Smoking status: Never Smoker   . Smokeless tobacco: Never Used  . Alcohol Use: No    Review of Systems Constitutional: Negative for fever. Cardiovascular: Negative for chest pain. Respiratory: Negative for shortness of breath. Gastrointestinal: Moderate upper abdominal pain. Positive for nausea and vomiting. Genitourinary:  Negative for dysuria. Musculoskeletal: Negative for back pain.  10-point ROS otherwise negative.  ____________________________________________   PHYSICAL EXAM:  VITAL SIGNS: ED Triage Vitals  Enc Vitals Group     BP 04/03/15 0054 212/108 mmHg     Pulse Rate 04/03/15 0054 116     Resp 04/03/15 0054 22     Temp 04/03/15 0054 98.4 F (36.9 C)     Temp Source 04/03/15 0054 Oral     SpO2 04/03/15 0054 100 %     Weight 04/03/15 0054 130 lb (58.968 kg)     Height 04/03/15 0054 5' 3"  (1.6 m)     Head Cir --      Peak Flow --      Pain Score 04/03/15 0056 10     Pain Loc --      Pain Edu? --      Excl. in [place]? --     Constitutional: Alert and oriented. Mild distress due to abdominal pain and nausea. Eyes: Normal exam   Mouth/Throat: Mucous membranes are moist. Cardiovascular: Normal rate, regular rhythm. No murmur Respiratory: Normal respiratory effort without tachypnea nor retractions. Breath sounds are clear Gastrointestinal: Moderate epigastric abdominal tenderness to palpation. Moderate left upper quadrant tenderness to palpation. No rebound or guarding. No distention. Musculoskeletal: Nontender with normal range of motion in all extremities.  Neurologic:  Normal speech and language. No gross focal neurologic deficits Skin:  Skin is warm, dry and intact.  Psychiatric: Mood and affect are normal. Speech and behavior are normal.  ____________________________________________    EKG  EKG reviewed and interpreted by myself shows sinus tachycardia at 108 bpm, narrow QRS, normal axis, normal intervals, nonspecific ST changes present. No ST elevations noted.  ____________________________________________    [id number]  CT abdomen/pelvis pending  ____________________________________________    INITIAL IMPRESSION / ASSESSMENT AND PLAN / ED COURSE  Pertinent labs & imaging results that were available during my care of the patient were reviewed by me and considered in my  medical decision making (see chart for details).  Patient with worsening abdominal pain, nausea and vomiting. The patient has a history of pancreatitis, most of her tenderness is in the epigastrium. We will check labs including lipase, as well as a troponin to help further evaluate.   Patient's heart rate has increased to 150. She continues to state abdominal pain. Her blood pressure is 882 systolic. Patient is unclear. She took her Lopressor tonight. We will dose IV metoprolol, and continue to closely monitor.  Labs have resulted, showing largely normal results. Lipase is negative. Troponin negative. Mild white  blood cell count elevation at 11.6. Elevated blood glucose, but it is coming down with IV hydration. Currently awaiting CT results. Patient has remained nauseated, requiring multiple rounds of antiemetics. Patient will likely require admission if she remains nauseated.  Patient care signed out to Dr. Reita Cliche. ____________________________________________   FINAL CLINICAL IMPRESSION(S) / ED DIAGNOSES  Epigastric pain Nausea and vomiting  Harvest Dark, MD 04/03/15 [phone]

## 2015-04-03 NOTE — ED Notes (Signed)
Dr. Kerman Passey notified of pt's blood pressure 197/111. No new orders received.

## 2015-04-03 NOTE — ED Notes (Signed)
Pt with hr up to 180 momentarily then down to 160, dr. Kerman Passey notified, order for metoprolol 83m iv received.

## 2015-04-03 NOTE — Progress Notes (Signed)
Received a call from lab stating troponin level is 0.05. MD notified. No new orders received. Will continue to monitor

## 2015-04-03 NOTE — ED Notes (Signed)
Dr. Kerman Passey notified of pt's continued hypertension 201/99, no new orders received. Pt continues to slowly sip on po contrast. Pt falling asleep during sipping.

## 2015-04-03 NOTE — ED Notes (Signed)
Dr. Kerman Passey notified of pt's continued hypertension 201/99. No new orders received.

## 2015-04-03 NOTE — H&P (Signed)
[facility] NAME: [name]    MR#:  [id number]  DATE OF BIRTH:  1956-07-02  DATE OF ADMISSION:  04/03/2015  PRIMARY CARE PHYSICIAN: Alvester Chou, NP   REQUESTING/REFERRING PHYSICIAN: Dr Reita Cliche  CHIEF COMPLAINT:  Abdominal pain HISTORY OF PRESENT ILLNESS:  [name]  is a 59 y.o. female with a known history of  DM, hypothyroid and GERD who presented with abdominal pain located mid epigastric region and associated nausea and vomiting for past several days.  In the ED she received multiple does of antiemetics and continues to have abdominal pain and nausea. CT scan of the abdomen was reported as non acute. She denies melena or hematochezia.   PAST MEDICAL HISTORY:   Past Medical History  Diagnosis Date  . Diabetes mellitus   . Hypothyroid   . Degenerative disk disease   . Stomach ulcer   . Diverticulitis   . Syncope 01/2015  . Anxiety   . GERD (gastroesophageal reflux disease)   . History of hiatal hernia   . Cancer     HX OF CANCER OF UTERUS   . TIA (transient ischemic attack)   . Hypertension   . Pancreatitis     PAST SURGICAL HISTORY:   Past Surgical History  Procedure Laterality Date  . Hernia repair    . Abdominal hysterectomy    . Cholecystectomy      SOCIAL HISTORY:   History  Substance Use Topics  . Smoking status: Never Smoker   . Smokeless tobacco: Never Used  . Alcohol Use: No    FAMILY HISTORY:   Family History  Problem Relation Age of Onset  . Hypertension Mother   . CAD Sister     DRUG ALLERGIES:   Allergies  Allergen Reactions  . Erythromycin Base Other (See Comments)    Fever  . Metformin     REACTION: Loose stools  . Pioglitazone Hcl-Metformin Hcl     REACTION: Diarrhea  . Rosiglitazone Maleate     REACTION: Swelling  . Sitagliptin-Metformin Hcl     REACTION: Rash  . Codeine Sulfate Rash  . Tetanus-Diphtheria Toxoids Td Rash and Other (See Comments)    Fever      REVIEW OF SYSTEMS:  CONSTITUTIONAL: No fever, fatigue  ++ weakness.  EYES: No blurred or double vision.  EARS, NOSE, AND THROAT: No tinnitus or ear pain.  RESPIRATORY: No cough, shortness of breath, wheezing or hemoptysis.  CARDIOVASCULAR: No chest pain, orthopnea, edema.  GASTROINTESTINAL: ++ nausea, vomiting, NO diarrhea ++epigastric abdominal pain.  GENITOURINARY: No dysuria, hematuria.  ENDOCRINE: No polyuria, nocturia,  HEMATOLOGY: No anemia, easy bruising or bleeding SKIN: No rash or lesion. MUSCULOSKELETAL: No joint pain or arthritis.   NEUROLOGIC: No tingling, numbness, weakness.  PSYCHIATRY: No anxiety or depression.   MEDICATIONS AT HOME:   Prior to Admission medications   Medication Sig Start Date End Date Taking? Authorizing Provider  atorvastatin (LIPITOR) 40 MG tablet Take 1 tablet (40 mg total) by mouth daily at 6 PM. 02/17/15  Yes Ilamae Geng, MD  citalopram (CELEXA) 20 MG tablet Take 1 tablet by mouth daily. 03/11/15  Yes Historical Provider, MD  dicyclomine (BENTYL) 10 MG capsule Take 1 capsule by mouth 4 (four) times daily. 03/11/15  Yes Historical Provider, MD  HYDROcodone-acetaminophen (NORCO/VICODIN) 5-325 MG per tablet Take 1 tablet by mouth every 6 (six) hours as needed for moderate pain.   Yes Historical Provider, MD  JANUVIA 100 MG  tablet Take 100 mg by mouth daily. 12/15/14  Yes Historical Provider, MD  lisinopril (PRINIVIL,ZESTRIL) 5 MG tablet Take 1 tablet by mouth daily. 03/15/15  Yes Historical Provider, MD  metFORMIN (GLUCOPHAGE) 1000 MG tablet Take 1 tablet by mouth 2 (two) times daily. 03/11/15  Yes Historical Provider, MD  metoprolol succinate (TOPROL-XL) 50 MG 24 hr tablet Take 1 tablet (50 mg total) by mouth daily. 03/14/15  Yes Henreitta Leber, MD  morphine (MS CONTIN) 100 MG 12 hr tablet Take 100 mg by mouth 2 (two) times daily.    Yes Historical Provider, MD  pantoprazole (PROTONIX) 20 MG tablet Take 1 tablet by mouth 2 (two) times daily.   Yes Historical  Provider, MD  tiZANidine (ZANAFLEX) 4 MG tablet Take 1 tablet by mouth every 6 (six) hours as needed for muscle spasms.    Yes Historical Provider, MD  zolpidem (AMBIEN) 5 MG tablet Take 1 tablet by mouth at bedtime.   Yes Historical Provider, MD      VITAL SIGNS:  Blood pressure 206/103, pulse 112, temperature 98.4 F (36.9 C), temperature source Oral, resp. rate 16, height 5' 3"  (1.6 m), weight 58.968 kg (130 lb), SpO2 99 %.  PHYSICAL EXAMINATION:  GENERAL:  59 y.o.-year-old patient lying in the bed with no acute distress.  EYES: Pupils equal, round, reactive to light and accommodation. No scleral icterus. Extraocular muscles intact.  HEENT: Head atraumatic, normocephalic. Oropharynx and nasopharynx clear.  NECK:  Supple, no jugular venous distention. No thyroid enlargement, no tenderness.  LUNGS: Normal breath sounds bilaterally, no wheezing, rales,rhonchi or crepitation. No use of accessory muscles of respiration.  CARDIOVASCULAR: S1, S2 normal. No murmurs, rubs, or gallops.  ABDOMEN: mid epigastic abdominal pain no rebound or guarding. + BS Soft . No organomegaly or mass.  EXTREMITIES: No pedal edema, cyanosis, or clubbing.  NEUROLOGIC: Cranial nerves II through XII are grossly intact. No focal deficits. PSYCHIATRIC: The patient is alert and oriented x 3.  SKIN: No obvious rash, lesion, or ulcer.   LABORATORY PANEL:   CBC  Recent Labs Lab 04/03/15 0227  WBC 11.6*  HGB 13.6  HCT 40.6  PLT 151   ------------------------------------------------------------------------------------------------------------------  Chemistries   Recent Labs Lab 04/03/15 0059  NA 137  K 3.8  CL 101  CO2 26  GLUCOSE 413*  BUN 25*  CREATININE 1.03*  CALCIUM 8.9  AST 52*  ALT 44  ALKPHOS 93  BILITOT 2.0*   ------------------------------------------------------------------------------------------------------------------  Cardiac Enzymes  Recent Labs Lab 04/03/15 0059  TROPONINI  0.03   ------------------------------------------------------------------------------------------------------------------  RADIOLOGY:  Ct Abdomen Pelvis W Contrast  04/03/2015     IMPRESSION: No acute abnormality.  Large stool burden ascending colon through the hepatic flexure.  Cirrhosis.  Single bilateral nonobstructing renal stones.  Atherosclerosis.   Electronically Signed   By: Inge Rise M.D.   On: 04/03/2015 07:38    EKG:  No ST elevation + TWI  IMPRESSION AND PLAN:  59 y/o female with GERD, DM and TIA here with mid epigastic abdominal pain.  1. Mid epigastic abdominal pain: This is concerning for ulcer or ACS. I will check Cardiac enzymes, start PPI. I have spoken with Dr. Candace Cruise. CT scan ABDOMEN without acute etiology. HOLD ASA/NSAIDS for now  2. DM: no complications: SSI for now  3. HLD: Continue atorvastatin  4. CLBP: PRN pain meds      All the records are reviewed and case discussed with Dr Candace Cruise. Management plans discussed with the patient and she  is in agreement.  CODE STATUS: FULL  TOTAL TIME TAKING CARE OF THIS PATIENT: 45 minutes.    Khianna Blazina M.D on 04/03/2015 at 10:37 AM  Between 7am to 6pm - Pager - [phone] After 6pm go to [email or web address] - password [email or web address] [facility]  [place] [facility]  Office  [phone]  CC: Primary care physician; Alvester Chou, NP

## 2015-04-03 NOTE — ED Notes (Signed)
Pt wretching into back, hr back up to 156. Dr. Kerman Passey notified, order for additional phenergan received,

## 2015-04-03 NOTE — ED Notes (Signed)
Report to derrick rn. Pt currently in ct scan.

## 2015-04-03 NOTE — ED Notes (Signed)
Pt sipping on po contrast.

## 2015-04-03 NOTE — ED Notes (Signed)
Pt continues to very slowly sip po contrast. Pt has to be encouraged approx every 10-15 minutes to drink more.

## 2015-04-03 NOTE — ED Provider Notes (Signed)
[facility]  I accepted care from Dr. Kerman Passey ____________________________________________    [place] were viewed by me. Imaging interpreted by radiologist.  CT abdomen and pelvis with contrast:  IMPRESSION: No acute abnormality.  Large stool burden ascending colon through the hepatic flexure.  Cirrhosis.  Single bilateral nonobstructing renal stones.  Atherosclerosis.  ____________________________________________   PROCEDURES  Procedure(s) performed: None  Critical Care performed: None  ____________________________________________   INITIAL IMPRESSION / ASSESSMENT AND PLAN / ED COURSE  CONSULTATIONS: Hospitalist for admission  Pertinent labs & imaging results that were available during my care of the patient were reviewed by me and considered in my medical decision making (see chart for details).  Patient is having significant and recurrent nausea, vomiting, abdominal pain. Her CT scan shows no acute abnormality, however she is still nauseated and in some pain. May give her another dose of symptomatic medication. Due to her intractable nausea and pain and she is to need admission. Her tachycardia is still around 110, and her blood pressure is still elevated, so I think some of this is related to the symptoms. I'm also given Ativan another liter of normal saline bolus.  Patient / Family / Caregiver informed of clinical course, medical decision-making process, and agree with plan.   I discussed return precautions, follow-up instructions, and discharged instructions with patient and/or family.  ____________________________________________   FINAL CLINICAL IMPRESSION(S) / ED DIAGNOSES  Final diagnoses:  Epigastric pain  Intractable vomiting with nausea, vomiting of unspecified type       Lisa Roca, MD 04/03/15 [phone]

## 2015-04-03 NOTE — ED Notes (Signed)
Pt with blood pressure of 217/102. Dr. Kerman Passey notified, no new orders received. Pt reports pain 8/10 and nausea improved. Pt states "i do feel better".

## 2015-04-03 NOTE — ED Notes (Signed)
Pt without emesis since phenergan administration. Pt continues to decline need to urinate.

## 2015-04-03 NOTE — ED Notes (Signed)
Pt to ct scan at 0614, ct tech states is unable to utilize iv in place. Pt to return to room for ultrasound guided iv insertion.

## 2015-04-03 NOTE — ED Notes (Signed)
Pt states abdominal pain radiating to central chest with nausea and vomiting for 3 days. Pt with tenderness on palpation of right and left upper quadrant.

## 2015-04-03 NOTE — Progress Notes (Signed)
Received a call from lab stating troponin level is 0.05. MD notified. No new orders received. Will continue to monitor.

## 2015-04-04 ENCOUNTER — Encounter
Admission: EM | Disposition: A | Discharge status: Home / Self Care | Payer: Self-pay | Source: Home / Self Care | Attending: Internal Medicine

## 2015-04-04 ENCOUNTER — Observation Stay: Payer: Medicare HMO | Admitting: Anesthesiology

## 2015-04-04 ENCOUNTER — Encounter: Payer: Self-pay | Admitting: Gastroenterology

## 2015-04-04 HISTORY — PX: ESOPHAGOGASTRODUODENOSCOPY: SHX5428

## 2015-04-04 LAB — URINALYSIS COMPLETE WITH MICROSCOPIC (ARMC ONLY)
Bacteria, UA: NONE SEEN
Bilirubin Urine: NEGATIVE
Ketones, ur: NEGATIVE mg/dL
LEUKOCYTES UA: NEGATIVE
Nitrite: NEGATIVE
PH: 6 (ref 5.0–8.0)
Protein, ur: NEGATIVE mg/dL
Specific Gravity, Urine: 1.009 (ref 1.005–1.030)
Squamous Epithelial / LPF: NONE SEEN

## 2015-04-04 LAB — CBC
HEMATOCRIT: 36.5 % (ref 35.0–47.0)
HEMOGLOBIN: 12.2 g/dL (ref 12.0–16.0)
MCH: 32 pg (ref 26.0–34.0)
MCHC: 33.4 g/dL (ref 32.0–36.0)
MCV: 95.8 fL (ref 80.0–100.0)
PLATELETS: 107 10*3/uL — AB (ref 150–440)
RBC: 3.81 MIL/uL (ref 3.80–5.20)
RDW: 17.3 % — AB (ref 11.5–14.5)
WBC: 9.7 10*3/uL (ref 3.6–11.0)

## 2015-04-04 LAB — BASIC METABOLIC PANEL
ANION GAP: 8 (ref 5–15)
BUN: 23 mg/dL — AB (ref 6–20)
CO2: 24 mmol/L (ref 22–32)
Calcium: 7.5 mg/dL — ABNORMAL LOW (ref 8.9–10.3)
Chloride: 108 mmol/L (ref 101–111)
Creatinine, Ser: 0.79 mg/dL (ref 0.44–1.00)
GFR calc Af Amer: >60 mL/min (ref >60)
GFR calc non Af Amer: >60 mL/min (ref >60)
GLUCOSE: 206 mg/dL — AB (ref 65–99)
Potassium: 3.2 mmol/L — ABNORMAL LOW (ref 3.5–5.1)
Sodium: 140 mmol/L (ref 135–145)

## 2015-04-04 LAB — GLUCOSE, CAPILLARY
GLUCOSE-CAPILLARY: 140 mg/dL — AB (ref 65–99)
Glucose-Capillary: 281 mg/dL — ABNORMAL HIGH (ref 65–99)
Glucose-Capillary: 351 mg/dL — ABNORMAL HIGH (ref 65–99)

## 2015-04-04 LAB — TROPONIN I: TROPONIN I: 0.04 ng/mL — AB (ref <0.031)

## 2015-04-04 SURGERY — EGD (ESOPHAGOGASTRODUODENOSCOPY)
Anesthesia: General

## 2015-04-04 MED ORDER — SODIUM CHLORIDE 0.9 % IV SOLN: INTRAVENOUS | Status: DC

## 2015-04-04 MED ORDER — MIDAZOLAM HCL 2 MG/2ML IJ SOLN
INTRAMUSCULAR | Status: DC | PRN
  Administered 2015-04-04: 1 mg via INTRAVENOUS

## 2015-04-04 MED ORDER — LIDOCAINE HCL (CARDIAC) 20 MG/ML IV SOLN
INTRAVENOUS | Status: DC | PRN
  Administered 2015-04-04: 40 mg via INTRAVENOUS

## 2015-04-04 MED ORDER — FENTANYL CITRATE (PF) 100 MCG/2ML IJ SOLN
INTRAMUSCULAR | Status: DC | PRN
  Administered 2015-04-04: 50 ug via INTRAVENOUS

## 2015-04-04 MED ORDER — PROPOFOL INFUSION 10 MG/ML OPTIME
INTRAVENOUS | Status: DC | PRN
  Administered 2015-04-04: 140 ug/kg/min via INTRAVENOUS

## 2015-04-04 MED ORDER — PROPOFOL 10 MG/ML IV BOLUS
INTRAVENOUS | Status: DC | PRN
  Administered 2015-04-04: 50 mg via INTRAVENOUS

## 2015-04-04 MED ORDER — POTASSIUM CHLORIDE 20 MEQ/15ML (10%) PO SOLN
40.0000 meq | Freq: Once | ORAL | Status: DC
  Filled 2015-04-04: qty 30

## 2015-04-04 MED ORDER — METOCLOPRAMIDE HCL 10 MG PO TABS
10.0000 mg | ORAL_TABLET | Freq: Three times a day (TID) | ORAL | Status: DC
  Administered 2015-04-04 – 2015-04-07 (×7): 10 mg via ORAL
  Filled 2015-04-04 (×2): qty 2
  Filled 2015-04-04 (×5): qty 1
  Filled 2015-04-04: qty 2
  Filled 2015-04-04: qty 1
  Filled 2015-04-04: qty 2
  Filled 2015-04-04 (×5): qty 1
  Filled 2015-04-04: qty 2

## 2015-04-04 MED ORDER — GLYCOPYRROLATE 0.2 MG/ML IJ SOLN
INTRAMUSCULAR | Status: DC | PRN
  Administered 2015-04-04: 0.2 mg via INTRAVENOUS

## 2015-04-04 MED ORDER — ENSURE ENLIVE PO LIQD
237.0000 mL | Freq: Two times a day (BID) | ORAL | Status: DC
  Administered 2015-04-04 – 2015-04-07 (×3): 237 mL via ORAL

## 2015-04-04 NOTE — Op Note (Signed)
[facility] Gastroenterology Patient Name: [name] Procedure Date: 04/04/2015 11:12 AM MRN: [id number] Account #: [id number] Date of Birth: 1955/10/17 Admit Type: Outpatient Age: 59 Room: [facility] ENDO ROOM 4 Gender: Female Note Status: Finalized Procedure:         Upper GI endoscopy Indications:       Epigastric abdominal pain, Nausea with vomiting, Hx of                     gastroparesis. Providers:         Lupita Dawn. Candace Cruise, MD Referring MD:      Eugenia Pancoast (Referring MD) Medicines:         Monitored Anesthesia Care Complications:     No immediate complications. Procedure:         Pre-Anesthesia Assessment:                    - Prior to the procedure, a History and Physical was                     performed, and patient medications, allergies and                     sensitivities were reviewed. The patient's tolerance of                     previous anesthesia was reviewed.                    - The risks and benefits of the procedure and the sedation                     options and risks were discussed with the patient. All                     questions were answered and informed consent was obtained.                    - After reviewing the risks and benefits, the patient was                     deemed in satisfactory condition to undergo the procedure.                    After obtaining informed consent, the endoscope was passed                     under direct vision. Throughout the procedure, the                     patient's blood pressure, pulse, and oxygen saturations                     were monitored continuously. The Endoscope was introduced                     through the mouth, and advanced to the second part of                     duodenum. The upper GI endoscopy was accomplished without                     difficulty. The patient tolerated the procedure well. Findings:      The examined esophagus was normal.  Diffuse mild inflammation  characterized by erythema was found in the       gastric body. Biopsies were taken with a cold forceps for Helicobacter       pylori testing. S/P Nissen fundoplication.      The exam was otherwise without abnormality.      The examined duodenum was normal. Impression:        - Normal esophagus.                    - Chronic gastritis. Biopsied.                    - The examination was otherwise normal.                    - Normal examined duodenum. Recommendation:    - Discharge patient to home.                    - Observe patient's clinical course.                    - Continue present medications.                    - Await pathology results.                    - The findings and recommendations were discussed with the                     patient. Procedure Code(s): --- Professional ---                    [phone], Esophagogastroduodenoscopy, flexible, transoral;                     with biopsy, single or multiple Diagnosis Code(s): --- Professional ---                    K29.50, Unspecified chronic gastritis without bleeding                    R10.13, Epigastric pain                    R11.2, Nausea with vomiting, unspecified CPT copyright 2014 American Medical Association. All rights reserved. The codes documented in this report are preliminary and upon coder review may  be revised to meet current compliance requirements. Hulen Luster, MD 04/04/2015 11:30:22 AM This report has been signed electronically. Number of Addenda: 0 Note Initiated On: 04/04/2015 11:12 AM      [place]

## 2015-04-04 NOTE — Transfer of Care (Signed)
  Immediate Anesthesia Transfer of Care Note  Patient: [name]  Procedure(s) Performed: Procedure(s): ESOPHAGOGASTRODUODENOSCOPY (EGD) (N/A)  Patient Location: PACU and Endoscopy Unit  Anesthesia Type:General  Level of Consciousness: sedated  Airway & Oxygen Therapy: Patient Spontanous Breathing and Patient connected to nasal cannula oxygen  Post-op Assessment: Report given to RN and Post -op Vital signs reviewed and stable  Post vital signs: Reviewed and stable  Last Vitals:  Filed Vitals:   04/04/15 1135  BP: 129/79  Pulse: 125  Temp: 36.8 C  Resp: 18    Complications: No apparent anesthesia complications

## 2015-04-04 NOTE — Consult Note (Signed)
GI Inpatient Consult Note  Reason for Consult:   Attending Requesting Consult:  History of Present Illness: [name] is a 59 y.o. female with sharp epigastric pain with nausea/vomiting x 4 days. Positive wt loss. I initially pt in 2015, when pt presented with dysphagia. EGD with dilation done without significant improvement. Then, pt admitted in 2/16 with abdominal pain, nausea/vomiting. CT suggested colitis but colonoscopy to cecum completely normal. Pt has poorly controlled DM. Does have hx of diabetic gastroparesis. Pt insists current symptoms are different from those in 2/16. Not on any PPI. No longer on bentyl. Used to be on regular reglan but now takes only 1 per day. Has dysphagia intermittently.  Past Medical History:  Past Medical History  Diagnosis Date  . Diabetes mellitus   . Hypothyroid   . Degenerative disk disease   . Stomach ulcer   . Diverticulitis   . Syncope 01/2015  . Anxiety   . GERD (gastroesophageal reflux disease)   . History of hiatal hernia   . Cancer     HX OF CANCER OF UTERUS   . TIA (transient ischemic attack)   . Hypertension   . Pancreatitis     Problem List: Patient Active Problem List   Diagnosis Date Noted  . Abdominal pain 04/03/2015  . Syncope and collapse 03/11/2015  . CVA (cerebral infarction) 02/15/2015  . Nausea with vomiting 01/12/2015  . Dehydration 01/12/2015  . Essential hypertension 01/12/2015  . Orthostatic hypotension 01/12/2015  . Chronically on opiate therapy 01/12/2015  . Gastroparesis 01/12/2015  . Orthostatic syncope 01/11/2015  . DEPRESSION/ANXIETY 06/27/2007  . MYOFASCIAL PAIN SYNDROME 06/27/2007  . Chronic pain syndrome 03/28/2007  . DM2 (diabetes mellitus, type 2) 03/27/2007  . GERD 03/27/2007  . DIVERTICULOSIS, COLON 03/27/2007  . LUMBAR DISC DISPLACEMENT 03/27/2007  . PROTEINURIA 03/27/2007  . UTERINE CANCER, HX OF 03/27/2007    Past Surgical History: Past Surgical History  Procedure Laterality Date   . Hernia repair    . Abdominal hysterectomy    . Cholecystectomy      Allergies: Allergies  Allergen Reactions  . Erythromycin Base Other (See Comments)    Fever  . Metformin     REACTION: Loose stools  . Pioglitazone Hcl-Metformin Hcl     REACTION: Diarrhea  . Rosiglitazone Maleate     REACTION: Swelling  . Sitagliptin-Metformin Hcl     REACTION: Rash  . Codeine Sulfate Rash  . Tetanus-Diphtheria Toxoids Td Rash and Other (See Comments)    Fever    Home Medications: Prescriptions prior to admission  Medication Sig Dispense Refill Last Dose  . atorvastatin (LIPITOR) 40 MG tablet Take 1 tablet (40 mg total) by mouth daily at 6 PM. 30 tablet 0 04/02/2015 at Unknown time  . citalopram (CELEXA) 20 MG tablet Take 1 tablet by mouth daily.   04/02/2015 at Unknown time  . dicyclomine (BENTYL) 10 MG capsule Take 1 capsule by mouth 4 (four) times daily.   04/02/2015 at Unknown time  . HYDROcodone-acetaminophen (NORCO/VICODIN) 5-325 MG per tablet Take 1 tablet by mouth every 6 (six) hours as needed for moderate pain.   04/02/2015 at Unknown time  . JANUVIA 100 MG tablet Take 100 mg by mouth daily.   04/02/2015 at Unknown time  . lisinopril (PRINIVIL,ZESTRIL) 5 MG tablet Take 1 tablet by mouth daily.   04/02/2015 at Unknown time  . metFORMIN (GLUCOPHAGE) 1000 MG tablet Take 1 tablet by mouth 2 (two) times daily.   04/02/2015 at Unknown time  .  metoprolol succinate (TOPROL-XL) 50 MG 24 hr tablet Take 1 tablet (50 mg total) by mouth daily. 60 tablet 1 04/02/2015 at Unknown time  . morphine (MS CONTIN) 100 MG 12 hr tablet Take 100 mg by mouth 2 (two) times daily.    04/02/2015 at Unknown time  . pantoprazole (PROTONIX) 20 MG tablet Take 1 tablet by mouth 2 (two) times daily.   04/02/2015 at Unknown time  . tiZANidine (ZANAFLEX) 4 MG tablet Take 1 tablet by mouth every 6 (six) hours as needed for muscle spasms.    04/02/2015 at Unknown time  . zolpidem (AMBIEN) 5 MG tablet Take 1 tablet by mouth at  bedtime.   Past Week at Unknown time   Home medication reconciliation was completed with the patient.   Scheduled Inpatient Medications:   . atorvastatin  40 mg Oral q1800  . citalopram  20 mg Oral Daily  . dicyclomine  10 mg Oral QID  . enoxaparin (LOVENOX) injection  40 mg Subcutaneous Q24H  . hydrALAZINE  25 mg Oral 3 times per day  . insulin aspart  0-5 Units Subcutaneous QHS  . insulin aspart  0-9 Units Subcutaneous TID WC  . lisinopril  5 mg Oral Daily  . metoprolol succinate  50 mg Oral Daily  . morphine  100 mg Oral BID  . pantoprazole (PROTONIX) IV  40 mg Intravenous Q12H  . sodium chloride  3 mL Intravenous Q12H  . zolpidem  5 mg Oral QHS    Continuous Inpatient Infusions:   . sodium chloride 75 mL/hr at 04/04/15 0512    PRN Inpatient Medications:  acetaminophen **OR** acetaminophen, alum & mag hydroxide-simeth, hydrALAZINE, HYDROcodone-acetaminophen, morphine injection, ondansetron **OR** ondansetron (ZOFRAN) IV, polyethylene glycol, tiZANidine  Family History: family history includes CAD in her sister; Hypertension in her mother.  The patient's family history is negative for inflammatory bowel disorders, GI malignancy, or solid organ transplantation.  Social History:   reports that she has never smoked. She has never used smokeless tobacco. She reports that she does not drink alcohol or use illicit drugs. The patient denies ETOH, tobacco, or drug use.   Review of Systems: Constitutional:10lb wt loss  Eyes: No changes in vision. ENT: No oral lesions, sore throat.  GI: see HPI.  Heme/Lymph: No easy bruising.  CV: No chest pain.  GU: No hematuria.  Integumentary: No rashes.  Neuro: No headaches.  Psych: No depression/anxiety.  Endocrine: No heat/cold intolerance.  Allergic/Immunologic: No urticaria.  Resp: No cough, SOB.  Musculoskeletal: No joint swelling.    Physical Examination: BP 100/54 mmHg  Pulse 133  Temp(Src) 98.4 F (36.9 C) (Oral)  Resp 18   Ht 5' 3"  (1.6 m)  Wt 58.968 kg (130 lb)  BMI 23.03 kg/m2  SpO2 97% Gen: NAD, alert and oriented x 4 HEENT: PEERLA, EOMI, Neck: supple, no JVD or thyromegaly Chest: CTA bilaterally, no wheezes, crackles, or other adventitious sounds CV: RRR, no m/g/c/r Abd: soft,epigastric tenderness, ND, +BS in all four quadrants; no HSM, guarding, ridigity, or rebound tenderness Ext: no edema, well perfused with 2+ pulses, Skin: no rash or lesions noted Lymph: no LAD  Data: Lab Results  Component Value Date   WBC 9.7 04/04/2015   HGB 12.2 04/04/2015   HCT 36.5 04/04/2015   MCV 95.8 04/04/2015   PLT 107* 04/04/2015    Recent Labs Lab 04/03/15 0227 04/04/15 0038  HGB 13.6 12.2   Lab Results  Component Value Date   NA 140 04/04/2015   K  3.2* 04/04/2015   CL 108 04/04/2015   CO2 24 04/04/2015   BUN 23* 04/04/2015   CREATININE 0.79 04/04/2015   Lab Results  Component Value Date   ALT 44 04/03/2015   AST 52* 04/03/2015   ALKPHOS 93 04/03/2015   BILITOT 2.0* 04/03/2015   No results for input(s): APTT, INR, PTT in the last 168 hours. Assessment/Plan: Ms. Faraone is a 59 y.o. female with epigastric pain and n/v. Could have exacerbation of gastroparesis but PUD also possible.   Recommendations: EGD today. Daily PPI. If neg, reglan at least TID. Check lipase as well. Thank you for the consult. Please call with questions or concerns.  Dayna Alia, Lupita Dawn, MD

## 2015-04-04 NOTE — Progress Notes (Signed)
Inpatient Diabetes Program Recommendations  AACE/ADA: New Consensus Statement on Inpatient Glycemic Control (2013)  Target Ranges:  Prepandial:   less than 140 mg/dL      Peak postprandial:   less than 180 mg/dL (1-2 hours)      Critically ill patients:  140 - 180 mg/dL   Results for [name], [name] (MRN [id number]) as of 04/04/2015 09:05  Ref. Range 04/03/2015 05:40 04/03/2015 17:51 04/03/2015 21:07 04/04/2015 07:35  Glucose-Capillary Latest Ref Range: 65-99 mg/dL 246 (H) 266 (H) 233 (H) 281 (H)    Diabetes history: DM2 Outpatient Diabetes medications: Januvia 100 mg daily, Metformin 1000 mg BID Current orders for Inpatient glycemic control: Novolog 0-9 units TID with meals, Novolog 0-5 units HS  Inpatient Diabetes Program Recommendations Correction (SSI): Please consider increasing Novolog correction to Moderate scale.  Thanks, Barnie Alderman, RN, MSN, CCRN, CDE Diabetes Coordinator Inpatient Diabetes Program [phone] (Team Pager from [place] to [place]) [phone] ([facility] office) [phone] [facility] office) [phone] [facility] office)'

## 2015-04-04 NOTE — Progress Notes (Signed)
Initial Nutrition Assessment  DOCUMENTATION CODES:   Severe malnutrition in context of acute illness/injury  INTERVENTION:   Meals and snacks: Cater to pt preferences once back on po diet Nutrition Supplement Therapy: Recommend Ensure Enlive po BID, each supplement provides 350 kcal and 20 grams of protein   NUTRITION DIAGNOSIS:   Inadequate oral intake related to altered GI function as evidenced by NPO status.    GOAL:   Patient will meet greater than or equal to 90% of their needs    MONITOR:    (Energy intake, Digestive system)  REASON FOR ASSESSMENT:   Malnutrition Screening Tool    ASSESSMENT:   Pt admitted with nausea, vomiting, weight loss.  EGD this am showing gastritis   Past Medical History  Diagnosis Date  . Diabetes mellitus   . Hypothyroid   . Degenerative disk disease   . Stomach ulcer   . Diverticulitis   . Syncope 01/2015  . Anxiety   . GERD (gastroesophageal reflux disease)   . History of hiatal hernia   . Cancer     HX OF CANCER OF UTERUS   . TIA (transient ischemic attack)   . Hypertension   . Pancreatitis     Current Nutrition: NPO  Food/Nutrition-Related History: pt reports for the last 4-5 days poor po intake secondary to nausea, vomiting, unable to keep food down   Medications: NS at 37m/hr, aspart, miralax, reglan, KCL,  Electrolyte/Renal Profile and Glucose Profile:   Recent Labs Lab 04/03/15 0059 04/04/15 0038  NA 137 140  K 3.8 3.2*  CL 101 108  CO2 26 24  BUN 25* 23*  CREATININE 1.03* 0.79  CALCIUM 8.9 7.5*  GLUCOSE 413* 206*   Protein Profile:  Recent Labs Lab 04/03/15 0059  ALBUMIN 4.1     Last BM:7/21   Nutrition-Focused Physical Exam Findings: Nutrition-Focused physical exam completed. Findings are no fat depletion, mild depletion in temple area all other areas normal  muscle depletion, and no edema.     Weight Change: 11% weight loss in the last 2 months per wt encounters    Diet Order:   Diet full liquid Room service appropriate?: Yes; Fluid consistency:: Thin  Skin:   reviewed  Height:   Ht Readings from Last 1 Encounters:  04/03/15 5' 3"  (1.6 m)    Weight:   Wt Readings from Last 1 Encounters:  04/03/15 130 lb (58.968 kg)    Ideal Body Weight:     Wt Readings from Last 10 Encounters:  04/03/15 130 lb (58.968 kg)  03/11/15 130 lb 1.6 oz (59.013 kg)  02/16/15 149 lb 8 oz (67.813 kg)  02/16/15 150 lb (68.04 kg)  01/11/15 146 lb 6.2 oz (66.4 kg)  08/23/11 158 lb (71.668 kg)  06/27/07 167 lb (75.751 kg)  05/08/07 159 lb (72.122 kg)  03/28/07 168 lb (76.204 kg)    BMI:  Body mass index is 23.03 kg/(m^2).  Estimated Nutritional Needs:   Kcal:  BEE 1124 kcals (IF 1.0-1.3, AF1.3) 11324-4010kcals/d  Protein:  (1.0-1.3 gm/kg) 59-77 g/d  Fluid:  (30-33mkg) 1770-206530m  EDUCATION NEEDS:   No education needs identified at this time  HIGH Care Level  [place] B. AllZenia ResidesD,[place]and[place]ager)

## 2015-04-04 NOTE — Anesthesia Postprocedure Evaluation (Signed)
  Anesthesia Post-op Note  Patient: [name]  Procedure(s) Performed: Procedure(s): ESOPHAGOGASTRODUODENOSCOPY (EGD) (N/A)  Anesthesia type:General  Patient location: PACU  Post pain: Pain level controlled  Post assessment: Post-op Vital signs reviewed, Patient's Cardiovascular Status Stable, Respiratory Function Stable, Patent Airway and No signs of Nausea or vomiting  Post vital signs: Reviewed and stable  Last Vitals:  Filed Vitals:   04/04/15 1150  BP: 138/78  Pulse: 113  Temp:   Resp: 15    Level of consciousness: awake, alert  and patient cooperative  Complications: No apparent anesthesia complications

## 2015-04-04 NOTE — Anesthesia Procedure Notes (Signed)
Date/Time: 04/04/2015 11:19 AM Performed by: Doreen Salvage Pre-anesthesia Checklist: Patient identified, Emergency Drugs available, Suction available and Patient being monitored Patient Re-evaluated:Patient Re-evaluated prior to inductionOxygen Delivery Method: Nasal cannula

## 2015-04-04 NOTE — Progress Notes (Signed)
Pt educated on procedure, questions answered and consent signed. Held all AM meds including beta blocker due to pt c/o severe nausea. MD aware.

## 2015-04-04 NOTE — Anesthesia Preprocedure Evaluation (Addendum)
Anesthesia Evaluation  Patient identified by MRN, date of birth, ID band Patient awake    Reviewed: Allergy & Precautions, NPO status , Patient's Chart, lab work & pertinent test results  History of Anesthesia Complications Negative for: history of anesthetic complications  Airway Mallampati: III  TM Distance: >3 FB Neck ROM: Full    Dental  (+) Upper Dentures, Lower Dentures, Chipped, Poor Dentition   Pulmonary          Cardiovascular hypertension, Pt. on medications + dysrhythmias Atrial Fibrillation     Neuro/Psych Anxiety Depression TIA (Residual r sided wakness)   GI/Hepatic hiatal hernia, PUD, GERD-  Medicated,  Endo/Other  diabetes, Type 2Hypothyroidism   Renal/GU      Musculoskeletal  (+) Arthritis -, Osteoarthritis,    Abdominal   Peds  Hematology   Anesthesia Other Findings   Reproductive/Obstetrics                            Anesthesia Physical Anesthesia Plan  ASA: III  Anesthesia Plan: General   Post-op Pain Management:    Induction: Intravenous  Airway Management Planned: Nasal Cannula  Additional Equipment:   Intra-op Plan:   Post-operative Plan:   Informed Consent: I have reviewed the patients History and Physical, chart, labs and discussed the procedure including the risks, benefits and alternatives for the proposed anesthesia with the patient or authorized representative who has indicated his/her understanding and acceptance.     Plan Discussed with:   Anesthesia Plan Comments:         Anesthesia Quick Evaluation

## 2015-04-04 NOTE — Op Note (Signed)
EGD showed diffuse gastritis. Bx's taken. Continue PPI BID. Also due to hx of DM gastroparesis, will add reglan 68m tid as well. Full liquid diet ordered. If tolerated, then advance to low residue, ADA diet. Thanks.

## 2015-04-04 NOTE — Progress Notes (Signed)
[place] at [place] NAME: [name]    MR#:  [id number]  DATE OF BIRTH:  [birth year]  SUBJECTIVE:  Patient continues to have epigastric abdominal pain. Patient was seen prior to her EGD this morning.  REVIEW OF SYSTEMS:    Review of Systems  Constitutional: Negative for fever, chills and malaise/fatigue.  HENT: Negative for sore throat.   Eyes: Negative for blurred vision.  Respiratory: Negative for cough, hemoptysis, shortness of breath and wheezing.   Cardiovascular: Negative for chest pain, palpitations and leg swelling.  Gastrointestinal: Positive for nausea, abdominal pain and constipation. Negative for vomiting, diarrhea and blood in stool.  Genitourinary: Negative for dysuria.  Musculoskeletal: Negative for back pain.  Neurological: Negative for dizziness, tremors and headaches.  Endo/Heme/Allergies: Does not bruise/bleed easily.    Tolerating Diet:NPO      DRUG ALLERGIES:   Allergies  Allergen Reactions  . Erythromycin Base Other (See Comments)    Fever  . Metformin     REACTION: Loose stools  . Pioglitazone Hcl-Metformin Hcl     REACTION: Diarrhea  . Rosiglitazone Maleate     REACTION: Swelling  . Sitagliptin-Metformin Hcl     REACTION: Rash  . Codeine Sulfate Rash  . Tetanus-Diphtheria Toxoids Td Rash and Other (See Comments)    Fever    VITALS:  Blood pressure 129/79, pulse 125, temperature 98.2 F (36.8 C), temperature source Tympanic, resp. rate 18, height 5' 3"  (1.6 m), weight 58.968 kg (130 lb), SpO2 100 %.  PHYSICAL EXAMINATION:   Physical Exam  Constitutional: She is oriented to person, place, and time and well-developed, well-nourished, and in no distress. No distress.  HENT:  Head: Normocephalic.  Eyes: No scleral icterus.  Neck: Normal range of motion. Neck supple. No JVD present. No tracheal deviation present.  Cardiovascular: Normal rate, regular rhythm and normal heart sounds.   Exam reveals no gallop and no friction rub.   No murmur heard. Pulmonary/Chest: Effort normal and breath sounds normal. No respiratory distress. She has no wheezes. She has no rales. She exhibits no tenderness.  Abdominal: Soft. Bowel sounds are normal. She exhibits no distension and no mass. There is tenderness (epigastric region). There is no rebound and no guarding.  Musculoskeletal: Normal range of motion. She exhibits no edema.  Neurological: She is alert and oriented to person, place, and time.  Skin: Skin is warm. No rash noted. No erythema.  Psychiatric: Affect and judgment normal.      LABORATORY PANEL:   CBC  Recent Labs Lab 04/04/15 0038  WBC 9.7  HGB 12.2  HCT 36.5  PLT 107*   ------------------------------------------------------------------------------------------------------------------  Chemistries   Recent Labs Lab 04/03/15 0059 04/04/15 0038  NA 137 140  K 3.8 3.2*  CL 101 108  CO2 26 24  GLUCOSE 413* 206*  BUN 25* 23*  CREATININE 1.03* 0.79  CALCIUM 8.9 7.5*  AST 52*  --   ALT 44  --   ALKPHOS 93  --   BILITOT 2.0*  --    ------------------------------------------------------------------------------------------------------------------  Cardiac Enzymes  Recent Labs Lab 04/03/15 1311 04/03/15 1826 04/04/15 0038  TROPONINI 0.05* 0.05* 0.04*   ------------------------------------------------------------------------------------------------------------------  RADIOLOGY:  Ct Abdomen Pelvis W Contrast  04/03/2015   CLINICAL DATA:  Abdominal pain, nausea and vomiting for 3 days, worsening. Initial encounter.  EXAM: CT ABDOMEN AND PELVIS WITH CONTRAST  TECHNIQUE: Multidetector CT imaging of the abdomen and pelvis was performed using the standard protocol following bolus  administration of intravenous contrast.  CONTRAST:  100 mL OMNIPAQUE IOHEXOL 300 MG/ML  SOLN  COMPARISON:  CT abdomen and pelvis 10/08/2014 and 07/16/2013.  FINDINGS: Mild dependent  atelectasis is seen in the lung bases. No pleural or pericardial effusion.  Nodular border of the liver consistent with cirrhosis is seen and was present on the prior exam. No focal liver lesion is identified. The patient is status post cholecystectomy. The adrenal glands, spleen and pancreas all appear normal. The patient has nonobstructing stones in the lower pole of both the right and left kidney measuring 0.5 cm on the left and 0.7 cm on the right. Scattered aortoiliac atherosclerosis without aneurysm is identified.  The patient is status post hysterectomy. There is a large volume of stool in the ascending colon to the hepatic flexure. The colon is otherwise unremarkable. The stomach and small bowel appear normal. There is no lymphadenopathy or fluid.  No lytic or sclerotic bony lesion is identified. Convex left scoliosis is noted.  IMPRESSION: No acute abnormality.  Large stool burden ascending colon through the hepatic flexure.  Cirrhosis.  Single bilateral nonobstructing renal stones.  Atherosclerosis.   Electronically Signed   By: Inge Rise M.D.   On: 04/03/2015 07:38     ASSESSMENT AND PLAN:   This is 59 year old female with a history of chronic pain and GERD who presented with epigastric abdominal pain.  1. Epigastric abdominal pain: Patient underwent an EGD which showed diffuse gastritis. She may have underlying gastroparesis as well. Patient will continue on PPI twice a day. She will continue on full liquid diet and if tolerated advance to low residual diet.   2. Diabetes without complication: Patient continue on sliding scale insulin for now.  3. Hyperlipidemia: Patient continue on atorvastatin.  4. Chronic lower back pain: Patient continue her outpatient pain medications.     Management plans discussed with the patient and she is in agreement.  CODE STATUS: Full  TOTAL TIME TAKING CARE OF THIS PATIENT: 30 minutes.   Greater than 50% counseling and coordination of  care  POSSIBLE D/C tomorrow, DEPENDING ON CLINICAL CONDITION.   Amon Costilla M.D on 04/04/2015 at 11:41 AM  Between 7am to 6pm - Pager - [phone] After 6pm go to [email or web address] - password [email or web address] [facility]  [place] [facility]  Office  [phone]  CC: Primary care physician; Alvester Chou, NP

## 2015-04-05 ENCOUNTER — Observation Stay: Payer: Medicare HMO

## 2015-04-05 ENCOUNTER — Inpatient Hospital Stay: Payer: Medicare HMO

## 2015-04-05 ENCOUNTER — Encounter: Payer: Self-pay | Admitting: *Deleted

## 2015-04-05 DIAGNOSIS — K8020 Calculus of gallbladder without cholecystitis without obstruction: Secondary | ICD-10-CM | POA: Diagnosis present

## 2015-04-05 DIAGNOSIS — R112 Nausea with vomiting, unspecified: Secondary | ICD-10-CM | POA: Diagnosis not present

## 2015-04-05 DIAGNOSIS — Z8249 Family history of ischemic heart disease and other diseases of the circulatory system: Secondary | ICD-10-CM | POA: Diagnosis not present

## 2015-04-05 DIAGNOSIS — Z823 Family history of stroke: Secondary | ICD-10-CM | POA: Diagnosis not present

## 2015-04-05 DIAGNOSIS — I471 Supraventricular tachycardia: Secondary | ICD-10-CM | POA: Diagnosis present

## 2015-04-05 DIAGNOSIS — G8929 Other chronic pain: Secondary | ICD-10-CM | POA: Diagnosis present

## 2015-04-05 DIAGNOSIS — Z888 Allergy status to other drugs, medicaments and biological substances status: Secondary | ICD-10-CM | POA: Diagnosis not present

## 2015-04-05 DIAGNOSIS — L89899 Pressure ulcer of other site, unspecified stage: Secondary | ICD-10-CM | POA: Diagnosis present

## 2015-04-05 DIAGNOSIS — E43 Unspecified severe protein-calorie malnutrition: Secondary | ICD-10-CM | POA: Diagnosis present

## 2015-04-05 DIAGNOSIS — Z8673 Personal history of transient ischemic attack (TIA), and cerebral infarction without residual deficits: Secondary | ICD-10-CM | POA: Diagnosis not present

## 2015-04-05 DIAGNOSIS — K7460 Unspecified cirrhosis of liver: Secondary | ICD-10-CM | POA: Diagnosis present

## 2015-04-05 DIAGNOSIS — R1013 Epigastric pain: Secondary | ICD-10-CM | POA: Diagnosis present

## 2015-04-05 DIAGNOSIS — E876 Hypokalemia: Secondary | ICD-10-CM | POA: Diagnosis present

## 2015-04-05 DIAGNOSIS — K5909 Other constipation: Secondary | ICD-10-CM | POA: Diagnosis not present

## 2015-04-05 DIAGNOSIS — Z79899 Other long term (current) drug therapy: Secondary | ICD-10-CM | POA: Diagnosis not present

## 2015-04-05 DIAGNOSIS — I480 Paroxysmal atrial fibrillation: Secondary | ICD-10-CM | POA: Diagnosis not present

## 2015-04-05 DIAGNOSIS — K219 Gastro-esophageal reflux disease without esophagitis: Secondary | ICD-10-CM | POA: Diagnosis present

## 2015-04-05 DIAGNOSIS — R1110 Vomiting, unspecified: Secondary | ICD-10-CM

## 2015-04-05 DIAGNOSIS — Z8711 Personal history of peptic ulcer disease: Secondary | ICD-10-CM | POA: Diagnosis not present

## 2015-04-05 DIAGNOSIS — R1310 Dysphagia, unspecified: Secondary | ICD-10-CM | POA: Diagnosis present

## 2015-04-05 DIAGNOSIS — I4891 Unspecified atrial fibrillation: Secondary | ICD-10-CM | POA: Diagnosis present

## 2015-04-05 DIAGNOSIS — Z794 Long term (current) use of insulin: Secondary | ICD-10-CM | POA: Diagnosis not present

## 2015-04-05 DIAGNOSIS — Z9889 Other specified postprocedural states: Secondary | ICD-10-CM | POA: Diagnosis not present

## 2015-04-05 DIAGNOSIS — Z9049 Acquired absence of other specified parts of digestive tract: Secondary | ICD-10-CM | POA: Diagnosis present

## 2015-04-05 DIAGNOSIS — K3184 Gastroparesis: Secondary | ICD-10-CM | POA: Diagnosis present

## 2015-04-05 DIAGNOSIS — Z90710 Acquired absence of both cervix and uterus: Secondary | ICD-10-CM | POA: Diagnosis not present

## 2015-04-05 DIAGNOSIS — Z6823 Body mass index (BMI) 23.0-23.9, adult: Secondary | ICD-10-CM | POA: Diagnosis not present

## 2015-04-05 DIAGNOSIS — E785 Hyperlipidemia, unspecified: Secondary | ICD-10-CM | POA: Diagnosis present

## 2015-04-05 DIAGNOSIS — E039 Hypothyroidism, unspecified: Secondary | ICD-10-CM | POA: Diagnosis present

## 2015-04-05 DIAGNOSIS — Z8542 Personal history of malignant neoplasm of other parts of uterus: Secondary | ICD-10-CM | POA: Diagnosis not present

## 2015-04-05 DIAGNOSIS — I10 Essential (primary) hypertension: Secondary | ICD-10-CM | POA: Diagnosis present

## 2015-04-05 DIAGNOSIS — E1065 Type 1 diabetes mellitus with hyperglycemia: Secondary | ICD-10-CM

## 2015-04-05 DIAGNOSIS — K2950 Unspecified chronic gastritis without bleeding: Secondary | ICD-10-CM | POA: Diagnosis present

## 2015-04-05 DIAGNOSIS — E109 Type 1 diabetes mellitus without complications: Secondary | ICD-10-CM | POA: Diagnosis not present

## 2015-04-05 DIAGNOSIS — I248 Other forms of acute ischemic heart disease: Secondary | ICD-10-CM | POA: Diagnosis present

## 2015-04-05 DIAGNOSIS — K5900 Constipation, unspecified: Secondary | ICD-10-CM

## 2015-04-05 DIAGNOSIS — Z79891 Long term (current) use of opiate analgesic: Secondary | ICD-10-CM | POA: Diagnosis not present

## 2015-04-05 DIAGNOSIS — M545 Low back pain: Secondary | ICD-10-CM | POA: Diagnosis present

## 2015-04-05 LAB — CK: CK TOTAL: 46 U/L (ref 38–234)

## 2015-04-05 LAB — HEPARIN LEVEL (UNFRACTIONATED): HEPARIN UNFRACTIONATED: 0.83 [IU]/mL — AB (ref 0.30–0.70)

## 2015-04-05 LAB — TROPONIN I
TROPONIN I: 0.08 ng/mL — AB (ref <0.031)
Troponin I: 0.16 ng/mL — ABNORMAL HIGH (ref <0.031)

## 2015-04-05 LAB — MAGNESIUM: Magnesium: 1.2 mg/dL — ABNORMAL LOW (ref 1.7–2.4)

## 2015-04-05 LAB — APTT: aPTT: 30 seconds (ref 24–36)

## 2015-04-05 LAB — POTASSIUM: Potassium: 3.5 mmol/L (ref 3.5–5.1)

## 2015-04-05 LAB — TSH: TSH: 1.581 u[IU]/mL (ref 0.350–4.500)

## 2015-04-05 LAB — GLUCOSE, CAPILLARY
Glucose-Capillary: 230 mg/dL — ABNORMAL HIGH (ref 65–99)
Glucose-Capillary: 314 mg/dL — ABNORMAL HIGH (ref 65–99)
Glucose-Capillary: 332 mg/dL — ABNORMAL HIGH (ref 65–99)
Glucose-Capillary: 315 mg/dL — ABNORMAL HIGH (ref 65–99)

## 2015-04-05 LAB — MRSA PCR SCREENING: MRSA BY PCR: NEGATIVE

## 2015-04-05 LAB — PROTIME-INR
INR: 1.27
Prothrombin Time: 16.1 seconds — ABNORMAL HIGH (ref 11.4–15.0)

## 2015-04-05 MED ORDER — ONDANSETRON HCL 4 MG/2ML IJ SOLN
4.0000 mg | Freq: Once | INTRAMUSCULAR | Status: AC
  Administered 2015-04-05: 4 mg via INTRAVENOUS

## 2015-04-05 MED ORDER — SODIUM CHLORIDE 0.9 % IJ SOLN
3.0000 mL | Freq: Two times a day (BID) | INTRAMUSCULAR | Status: DC
  Administered 2015-04-05 – 2015-04-07 (×4): 3 mL via INTRAVENOUS

## 2015-04-05 MED ORDER — ACETAMINOPHEN 325 MG PO TABS: 650.0000 mg | ORAL_TABLET | Freq: Four times a day (QID) | ORAL | Status: DC | PRN

## 2015-04-05 MED ORDER — ONDANSETRON HCL 4 MG/2ML IJ SOLN
4.0000 mg | Freq: Four times a day (QID) | INTRAMUSCULAR | Status: DC | PRN
  Administered 2015-04-05 – 2015-04-06 (×2): 4 mg via INTRAVENOUS
  Filled 2015-04-05 (×2): qty 2

## 2015-04-05 MED ORDER — COLLAGENASE 250 UNIT/GM EX OINT
TOPICAL_OINTMENT | Freq: Every day | CUTANEOUS | Status: DC
  Administered 2015-04-05 – 2015-04-06 (×2): via TOPICAL
  Filled 2015-04-05: qty 30

## 2015-04-05 MED ORDER — DOCUSATE SODIUM 100 MG PO CAPS
100.0000 mg | ORAL_CAPSULE | Freq: Two times a day (BID) | ORAL | Status: DC
  Administered 2015-04-05 – 2015-04-07 (×5): 100 mg via ORAL
  Filled 2015-04-05 (×5): qty 1

## 2015-04-05 MED ORDER — MORPHINE SULFATE 2 MG/ML IJ SOLN
1.0000 mg | INTRAMUSCULAR | Status: DC | PRN
  Administered 2015-04-05 – 2015-04-07 (×7): 1 mg via INTRAVENOUS
  Filled 2015-04-05 (×9): qty 1

## 2015-04-05 MED ORDER — ACETAMINOPHEN 650 MG RE SUPP: 650.0000 mg | Freq: Four times a day (QID) | RECTAL | Status: DC | PRN

## 2015-04-05 MED ORDER — ENOXAPARIN SODIUM 40 MG/0.4ML ~~LOC~~ SOLN: 40.0000 mg | SUBCUTANEOUS | Status: DC

## 2015-04-05 MED ORDER — AMIODARONE HCL IN DEXTROSE 360-4.14 MG/200ML-% IV SOLN
30.0000 mg/h | INTRAVENOUS | Status: DC
  Administered 2015-04-06: 30 mg/h via INTRAVENOUS
  Filled 2015-04-05 (×5): qty 200

## 2015-04-05 MED ORDER — AMIODARONE HCL 150 MG/3ML IV SOLN
INTRAVENOUS | Status: AC
  Filled 2015-04-05: qty 3

## 2015-04-05 MED ORDER — AMIODARONE LOAD VIA INFUSION
150.0000 mg | Freq: Once | INTRAVENOUS | Status: AC
  Administered 2015-04-05: 150 mg via INTRAVENOUS
  Filled 2015-04-05: qty 83.34

## 2015-04-05 MED ORDER — ALUM & MAG HYDROXIDE-SIMETH 200-200-20 MG/5ML PO SUSP: 30.0000 mL | Freq: Four times a day (QID) | ORAL | Status: DC | PRN

## 2015-04-05 MED ORDER — DILTIAZEM HCL 100 MG IV SOLR
5.0000 mg/h | INTRAVENOUS | Status: DC
  Administered 2015-04-05 (×2): 5 mg/h via INTRAVENOUS
  Filled 2015-04-05 (×2): qty 100

## 2015-04-05 MED ORDER — ONDANSETRON HCL 4 MG/2ML IJ SOLN
INTRAMUSCULAR | Status: AC
  Administered 2015-04-05: 4 mg via INTRAVENOUS
  Filled 2015-04-05: qty 2

## 2015-04-05 MED ORDER — SENNA 8.6 MG PO TABS
1.0000 | ORAL_TABLET | Freq: Two times a day (BID) | ORAL | Status: DC
  Administered 2015-04-05 – 2015-04-07 (×5): 8.6 mg via ORAL
  Filled 2015-04-05 (×5): qty 1

## 2015-04-05 MED ORDER — SENNOSIDES-DOCUSATE SODIUM 8.6-50 MG PO TABS: 1.0000 | ORAL_TABLET | Freq: Every evening | ORAL | Status: DC | PRN

## 2015-04-05 MED ORDER — MORPHINE SULFATE 2 MG/ML IJ SOLN
2.0000 mg | Freq: Once | INTRAMUSCULAR | Status: AC
  Administered 2015-04-05: 2 mg via INTRAVENOUS
  Filled 2015-04-05: qty 1

## 2015-04-05 MED ORDER — DILTIAZEM LOAD VIA INFUSION
10.0000 mg | Freq: Once | INTRAVENOUS | Status: AC
  Administered 2015-04-05: 10 mg via INTRAVENOUS
  Filled 2015-04-05: qty 10

## 2015-04-05 MED ORDER — ONDANSETRON HCL 4 MG PO TABS
4.0000 mg | ORAL_TABLET | Freq: Four times a day (QID) | ORAL | Status: DC | PRN
  Administered 2015-04-05: 4 mg via ORAL
  Filled 2015-04-05: qty 1

## 2015-04-05 MED ORDER — ENOXAPARIN SODIUM 60 MG/0.6ML ~~LOC~~ SOLN: 20.0000 mg | Freq: Two times a day (BID) | SUBCUTANEOUS | Status: DC

## 2015-04-05 MED ORDER — ONDANSETRON HCL 4 MG/2ML IJ SOLN
4.0000 mg | Freq: Once | INTRAMUSCULAR | Status: AC
  Administered 2015-04-05: 4 mg via INTRAVENOUS
  Filled 2015-04-05: qty 2

## 2015-04-05 MED ORDER — AMIODARONE HCL IN DEXTROSE 360-4.14 MG/200ML-% IV SOLN
60.0000 mg/h | INTRAVENOUS | Status: AC
  Administered 2015-04-05 (×2): 60 mg/h via INTRAVENOUS
  Filled 2015-04-05 (×2): qty 200

## 2015-04-05 MED ORDER — HEPARIN (PORCINE) IN NACL 100-0.45 UNIT/ML-% IJ SOLN
800.0000 [IU]/h | INTRAMUSCULAR | Status: DC
  Administered 2015-04-05: 850 [IU]/h via INTRAVENOUS
  Administered 2015-04-06: 800 [IU]/h via INTRAVENOUS
  Filled 2015-04-05 (×4): qty 250

## 2015-04-05 MED ORDER — FLEET ENEMA 7-19 GM/118ML RE ENEM: 1.0000 | ENEMA | Freq: Once | RECTAL | Status: DC

## 2015-04-05 MED ORDER — ONDANSETRON HCL 4 MG/2ML IJ SOLN: 4.0000 mg | Freq: Once | INTRAMUSCULAR | Status: DC

## 2015-04-05 MED ORDER — PROMETHAZINE HCL 25 MG/ML IJ SOLN
12.5000 mg | Freq: Once | INTRAMUSCULAR | Status: AC
  Administered 2015-04-05: 12.5 mg via INTRAVENOUS
  Filled 2015-04-05: qty 1

## 2015-04-05 MED ORDER — ENOXAPARIN SODIUM 60 MG/0.6ML ~~LOC~~ SOLN: 20.0000 mg | SUBCUTANEOUS | Status: DC

## 2015-04-05 NOTE — Progress Notes (Signed)
Report called to [place] RN and pt transferred to ICU RM 2.

## 2015-04-05 NOTE — Progress Notes (Signed)
Inpatient Diabetes Program Recommendations  AACE/ADA: New Consensus Statement on Inpatient Glycemic Control (2013)  Target Ranges:  Prepandial:   less than 140 mg/dL      Peak postprandial:   less than 180 mg/dL (1-2 hours)      Critically ill patients:  140 - 180 mg/dL   Results for [name], [name] (MRN [id number]) as of 04/05/2015 12:07  Ref. Range 04/04/2015 07:35 04/04/2015 17:00 04/04/2015 21:29 04/05/2015 07:45  Glucose-Capillary Latest Ref Range: 65-99 mg/dL 281 (H) 351 (H) 140 (H) 230 (H)    Diabetes history: DM2 Outpatient Diabetes medications: Januvia 100 mg daily, Metformin 1000 mg BID Current orders for Inpatient glycemic control: Novolog 0-9 units TID with meals, Novolog 0-5 units HS  Inpatient Diabetes Program Recommendations Insulin - Basal: Fasting glucose 281 mg/dl on 7/25 and 230 mg/dl this morning. May want to consider ordering low dose basal insulin; recommend starting with Lantus 5 units Q24H starting now. Diet: Please consider changing Ensure supplements to Glucerna BID.  Thanks, Barnie Alderman, RN, MSN, CCRN, CDE Diabetes Coordinator Inpatient Diabetes Program [phone] (Team Pager from [place] to [place]) [phone] ([facility] office) [phone] [facility] office) [phone] [facility] office)

## 2015-04-05 NOTE — Progress Notes (Signed)
Dr. Reece Levy notified notified of pt still having nausea and pain. Morphine not due at this time. MD ordered phenergan 12.40m IV once and morphine 263mIV once.  Will cont to monitor.

## 2015-04-05 NOTE — Progress Notes (Signed)
[place] at [place] NAME: Nevaen Tredway    MR#:  [id number]  DATE OF BIRTH:  [birth year]  SUBJECTIVE:  Patient went into new onset atrial fibrillation. She is being transferred to stepdown unit. She also reports that she has right sided chest pain without radiation. She denies any shortness of breath. She continues to have her epigastric pain and nausea.  REVIEW OF SYSTEMS:    Review of Systems  Constitutional: Negative for fever, chills and malaise/fatigue.  HENT: Negative for sore throat.   Eyes: Negative for blurred vision.  Respiratory: Negative for cough, hemoptysis, shortness of breath and wheezing.   Cardiovascular: Positive for chest pain. Negative for palpitations, orthopnea, claudication and leg swelling.  Gastrointestinal: Positive for nausea, abdominal pain and constipation. Negative for vomiting, diarrhea and blood in stool.  Genitourinary: Negative for dysuria.  Musculoskeletal: Negative for back pain.  Neurological: Negative for dizziness, tremors and headaches.  Endo/Heme/Allergies: Does not bruise/bleed easily.    Tolerating Diet: No      DRUG ALLERGIES:   Allergies  Allergen Reactions  . Erythromycin Base Other (See Comments)    Fever  . Metformin     REACTION: Loose stools  . Pioglitazone Hcl-Metformin Hcl     REACTION: Diarrhea  . Rosiglitazone Maleate     REACTION: Swelling  . Sitagliptin-Metformin Hcl     REACTION: Rash  . Codeine Sulfate Rash  . Tetanus-Diphtheria Toxoids Td Rash and Other (See Comments)    Fever    VITALS:  Blood pressure 103/53, pulse 101, temperature 98.4 F (36.9 C), temperature source Oral, resp. rate 16, height 5' 3"  (1.6 m), weight 58.968 kg (130 lb), SpO2 99 %.  PHYSICAL EXAMINATION:   Physical Exam  Constitutional: She is oriented to person, place, and time and well-developed, well-nourished, and in no distress. No distress.  HENT:  Head: Normocephalic.  Eyes:  No scleral icterus.  Neck: Normal range of motion. Neck supple. No JVD present. No tracheal deviation present.  Cardiovascular: Normal heart sounds.  Exam reveals no gallop and no friction rub.   No murmur heard. Irr,irr heart beat in atrial fib  Pulmonary/Chest: Effort normal and breath sounds normal. No respiratory distress. She has no wheezes. She has no rales. She exhibits no tenderness.  Abdominal: Soft. Bowel sounds are normal. She exhibits no distension and no mass. There is tenderness (epigastric region). There is no rebound and no guarding.  Musculoskeletal: Normal range of motion. She exhibits no edema.  Neurological: She is alert and oriented to person, place, and time.  Skin: Skin is warm. No rash noted. No erythema.  Psychiatric: Affect and judgment normal.      LABORATORY PANEL:   CBC  Recent Labs Lab 04/04/15 0038  WBC 9.7  HGB 12.2  HCT 36.5  PLT 107*   ------------------------------------------------------------------------------------------------------------------  Chemistries   Recent Labs Lab 04/03/15 0059 04/04/15 0038  NA 137 140  K 3.8 3.2*  CL 101 108  CO2 26 24  GLUCOSE 413* 206*  BUN 25* 23*  CREATININE 1.03* 0.79  CALCIUM 8.9 7.5*  AST 52*  --   ALT 44  --   ALKPHOS 93  --   BILITOT 2.0*  --    ------------------------------------------------------------------------------------------------------------------  Cardiac Enzymes  Recent Labs Lab 04/03/15 1311 04/03/15 1826 04/04/15 0038  TROPONINI 0.05* 0.05* 0.04*   ------------------------------------------------------------------------------------------------------------------  RADIOLOGY:  Dg Abd 1 View  04/05/2015   CLINICAL DATA:  Epigastric abdominal pain  EXAM: ABDOMEN -  1 VIEW  COMPARISON:  04/03/2015  FINDINGS: Scattered large and small bowel gas is noted. Fecal material is noted within the colon. Additionally contrast from recent CT examination is seen within the colon. No  obstructive changes are noted. No free air is seen. No acute bony abnormality is noted.  IMPRESSION: No acute abnormality noted.   Electronically Signed   By: Inez Catalina M.D.   On: 04/05/2015 09:03     ASSESSMENT AND PLAN:   This is 59 year old female with a history of chronic pain and GERD who presented with epigastric abdominal pain and subsequently has new-onset atrial fibrillation while in the hospital.  1. Epigastric abdominal pain: Patient underwent an EGD which showed diffuse gastritis. She may have underlying gastroparesis as well. Patient will continue on PPI twice a day. She will continue on full liquid diet and when tolerated advance to low residual diet.   2. Diabetes without complication: Patient continue on sliding scale insulin for now.  3. Hyperlipidemia: Patient continue on atorvastatin.  4. Chronic lower back pain: Patient continue her outpatient pain medications. 5. New onset atrial fibrillation: Patient is being transferred to stepdown unit. She is started on diltiazem drip. I will order echocardiogram, TSH and cardiology consultation. I will continue to monitor troponins as well. Her initial set of troponins on admission were negative 3, but due to her chest pain I will order these troponins. EKG shows TWI changes inferior leads.  6. Hypokalemia: Potassium has been repleted. I will repeat a BMP in a.m.  Management plans discussed with the patient and she is in agreement.  CODE STATUS: Full  CRITICAL CARE TOTAL TIME TAKING CARE OF THIS PATIENT: 35 minutes.   Greater than 50% counseling and coordination of care  POSSIBLE D/C tomorrow, DEPENDING ON CLINICAL CONDITION.   Huma Imhoff M.D on 04/05/2015 at 11:02 AM  Between 7am to 6pm - Pager - [phone] After 6pm go to [email or web address] - password [email or web address] [facility]  [place] [facility]  Office  [phone]  CC: Primary care physician; Alvester Chou, NP

## 2015-04-05 NOTE — Progress Notes (Signed)
RN made Dr. Rockey Situ aware that patient is now in NSR rate 80-90's on 32m cardizem drip and amio drip. Dr. GRockey Situstated "continue both drips for now since she has been so nauseated."

## 2015-04-05 NOTE — Consult Note (Addendum)
Cardiology Consultation Note  Patient ID: [name], MRN: [id number], DOB/AGE: 61-02-1956 61 y.o. Admit date: 04/03/2015   Date of Consult: 04/05/2015 Primary Physician: Alvester Chou, NP Primary Cardiologist: New to [facility]  Chief Complaint: Nausea and vomiting  Reason for Consult: New onset Afib, abnormal EKG, chest pain radiating to the back, and elevated troponin   HPI: 59 y.o. female with h/o reported arrhythmia per [facility], TIA, syncope, pancreatitis, type 1 diabetes, cholelithiasis without obstruction, diverticulitis, eosinophilic esophagitis s/p Nissen fundoplication 04/17/5026 who presented to [facility] on 04/03/2015 with nausea and vomiting and subsequently developed Afib with RVR and intermittent SVT with HR into the 180s and 200s at times.   She has no previously known cardiac history, though Care Everywhere does mention arrhythmia as above. She has never had a stress test or cardiac cath. She was recently admitted to [facility] in early June for TIA with complaints of bilateral upper and lower extremity weakness. TTE showed EF 65%, normal wall motion without RWMA, mild MR, no patent foramen ovale. No mention of bubble study. Carotid dopplers showed <50% bilateral stenosis. She was seen by neuro and continued aspirin and Lipitor. She was admitted again at the beginning of July for witnessed syncope (by husband) and bradycardia. She was consulted on by outside group. Syncopal episode occurred while ambulating per patient. Question seizure-like activity. It was felt her syncope was 2/2 orthostasis which still persisted at d/c, but she was asymptomatic at that time per note. Cortisol was normal, TSH mildly elevated with normal free T3 and total T4 c/w subclinical dz.   She presented to [facility] on 7/24 with nausea and vomiting that has been intractable with associated epigastric pain. No fevers, chills, diarrhea, constipation, or weight gain. No BRBPR or melena. No chest pain initially upon her  arrival or associated SOB or palpitations.   Upon her arrival she underwent CT abdomen and pelvis that showed no acute abnormality with large stool burden, cirrhosis, single bilateral nonobstructing renal stones, and atherosclerosis. EKG on 7/24 showed sinus tachycardia, 108 bpm, left atrial enlargement, cannot rule out anterior infarct. Troponin showed 0.05-->0.05-->0.04 initially.   She subsequently developed Afib with RVR with HR into the 180s with intermittent episodes of SVT with associated chest pain radiating to the back. She was transferred to the CCU, started on Cardizem gtt and cardiology consult was placed. EKG at that time showed Afib with RVR, 164 bpm, 2 mm st depression lead I, deep TWI III, TWI aVF, V6, possible anterior infarct, nonspecific st/t changes leads II, V4-V5. Continuation of the above troponin trend showed 0.08. Upon seeing the patient and reviewing tele she was NSR up until 9 AM on 7/26, then went into Afib with RVR with the above intermittent episodes of SVT. She complains of chest pain that radiates to her back. CXR has been ordered to evaluate for widened mediastinum. She was started on Lovenox which has been changed to heparin gtt given the above, as well as should acute intervention be required. She was also started on amiodarone gtt with bolus.       Past Medical History  Diagnosis Date  . Type 1 diabetes   . Hypothyroid   . Degenerative disk disease   . Stomach ulcer   . Diverticulitis   . Syncope 01/2015  . Anxiety   . GERD (gastroesophageal reflux disease)   . History of hiatal hernia   . Cancer     HX OF CANCER OF UTERUS   . TIA (transient  ischemic attack) 02/2015  . Hypertension   . Pancreatitis       Most Recent Cardiac Studies: Echo 02/16/2015  Study Conclusions  - Left ventricle: The cavity size was normal. Systolic function was normal. The estimated ejection fraction was 65%. Wall motion was normal; there were no regional wall motion  abnormalities. - Aortic valve: Valve area (Vmax): 2.21 cm^2. - Mitral valve: There was mild regurgitation. - Atrial septum: No defect or patent foramen ovale was identified.   Surgical History:  Past Surgical History  Procedure Laterality Date  . Hernia repair    . Abdominal hysterectomy    . Cholecystectomy    . Esophagogastroduodenoscopy N/A 04/04/2015    Procedure: ESOPHAGOGASTRODUODENOSCOPY (EGD);  Surgeon: Hulen Luster, MD;  Location: [facility] ENDOSCOPY;  Service: Endoscopy;  Laterality: N/A;     Home Meds: Prior to Admission medications   Medication Sig Start Date End Date Taking? Authorizing Provider  atorvastatin (LIPITOR) 40 MG tablet Take 1 tablet (40 mg total) by mouth daily at 6 PM. 02/17/15  Yes Sital Mody, MD  citalopram (CELEXA) 20 MG tablet Take 1 tablet by mouth daily. 03/11/15  Yes Historical Provider, MD  dicyclomine (BENTYL) 10 MG capsule Take 1 capsule by mouth 4 (four) times daily. 03/11/15  Yes Historical Provider, MD  HYDROcodone-acetaminophen (NORCO/VICODIN) 5-325 MG per tablet Take 1 tablet by mouth every 6 (six) hours as needed for moderate pain.   Yes Historical Provider, MD  JANUVIA 100 MG tablet Take 100 mg by mouth daily. 12/15/14  Yes Historical Provider, MD  lisinopril (PRINIVIL,ZESTRIL) 5 MG tablet Take 1 tablet by mouth daily. 03/15/15  Yes Historical Provider, MD  metFORMIN (GLUCOPHAGE) 1000 MG tablet Take 1 tablet by mouth 2 (two) times daily. 03/11/15  Yes Historical Provider, MD  metoprolol succinate (TOPROL-XL) 50 MG 24 hr tablet Take 1 tablet (50 mg total) by mouth daily. 03/14/15  Yes Henreitta Leber, MD  morphine (MS CONTIN) 100 MG 12 hr tablet Take 100 mg by mouth 2 (two) times daily.    Yes Historical Provider, MD  pantoprazole (PROTONIX) 20 MG tablet Take 1 tablet by mouth 2 (two) times daily.   Yes Historical Provider, MD  tiZANidine (ZANAFLEX) 4 MG tablet Take 1 tablet by mouth every 6 (six) hours as needed for muscle spasms.    Yes Historical Provider, MD    zolpidem (AMBIEN) 5 MG tablet Take 1 tablet by mouth at bedtime.   Yes Historical Provider, MD    Inpatient Medications:  . amiodarone  150 mg Intravenous Once  . atorvastatin  40 mg Oral q1800  . citalopram  20 mg Oral Daily  . collagenase   Topical Daily  . dicyclomine  10 mg Oral QID  . docusate sodium  100 mg Oral BID  . feeding supplement (ENSURE ENLIVE)  237 mL Oral BID BM  . hydrALAZINE  25 mg Oral 3 times per day  . insulin aspart  0-5 Units Subcutaneous QHS  . insulin aspart  0-9 Units Subcutaneous TID WC  . lisinopril  5 mg Oral Daily  . metoCLOPramide  10 mg Oral TID AC  . metoprolol succinate  50 mg Oral Daily  . morphine  100 mg Oral BID  . pantoprazole (PROTONIX) IV  40 mg Intravenous Q12H  . potassium chloride  40 mEq Oral Once  . senna  1 tablet Oral BID  . sodium chloride  3 mL Intravenous Q12H  . sodium chloride  3 mL Intravenous Q12H  .  sodium phosphate  1 enema Rectal Once  . zolpidem  5 mg Oral QHS   . sodium chloride 75 mL/hr at 04/05/15 0150  . amiodarone     Followed by  . amiodarone    . diltiazem (CARDIZEM) infusion 5 mg/hr (04/05/15 0956)  . heparin      Allergies:  Allergies  Allergen Reactions  . Erythromycin Base Other (See Comments)    Fever  . Metformin     REACTION: Loose stools  . Pioglitazone Hcl-Metformin Hcl     REACTION: Diarrhea  . Rosiglitazone Maleate     REACTION: Swelling  . Sitagliptin-Metformin Hcl     REACTION: Rash  . Codeine Sulfate Rash  . Tetanus-Diphtheria Toxoids Td Rash and Other (See Comments)    Fever    History   Social History  . Marital Status: Married    Spouse Name: N/A  . Number of Children: N/A  . Years of Education: N/A   Occupational History  . Not on file.   Social History Main Topics  . Smoking status: Never Smoker   . Smokeless tobacco: Never Used  . Alcohol Use: No  . Drug Use: No  . Sexual Activity: No   Other Topics Concern  . Not on file   Social History Narrative      Family History  Problem Relation Age of Onset  . Hypertension Mother   . CAD Sister      Review of Systems: Review of Systems  Constitutional: Positive for malaise/fatigue. Negative for fever, chills, weight loss and diaphoresis.  HENT: Negative for congestion.   Eyes: Negative for blurred vision, discharge and redness.  Respiratory: Positive for shortness of breath. Negative for cough, hemoptysis, sputum production and wheezing.   Cardiovascular: Positive for chest pain and palpitations. Negative for orthopnea, claudication, leg swelling and PND.  Gastrointestinal: Positive for nausea, vomiting and abdominal pain. Negative for heartburn, diarrhea, constipation, blood in stool and melena.  Genitourinary: Negative for hematuria.  Musculoskeletal: Negative for myalgias and falls.  Skin: Negative for rash.  Neurological: Positive for dizziness and weakness. Negative for sensory change, speech change, focal weakness and headaches.  Endo/Heme/Allergies: Does not bruise/bleed easily.  Psychiatric/Behavioral: Negative for substance abuse. The patient is not nervous/anxious.   All other systems reviewed and are negative.    Labs:  Recent Labs  04/03/15 1311 04/03/15 1826 04/04/15 0038 04/05/15 1059  CKTOTAL  --   --   --  46  TROPONINI 0.05* 0.05* 0.04* 0.08*   Lab Results  Component Value Date   WBC 9.7 04/04/2015   HGB 12.2 04/04/2015   HCT 36.5 04/04/2015   MCV 95.8 04/04/2015   PLT 107* 04/04/2015     Recent Labs Lab 04/03/15 0059 04/04/15 0038  NA 137 140  K 3.8 3.2*  CL 101 108  CO2 26 24  BUN 25* 23*  CREATININE 1.03* 0.79  CALCIUM 8.9 7.5*  PROT 7.7  --   BILITOT 2.0*  --   ALKPHOS 93  --   ALT 44  --   AST 52*  --   GLUCOSE 413* 206*   Lab Results  Component Value Date   CHOL 136 02/16/2015   HDL 48 02/16/2015   LDLCALC 65 02/16/2015   TRIG 117 02/16/2015   Lab Results  Component Value Date   DDIMER  10/26/2007    0.24        AT THE INHOUSE  ESTABLISHED CUTOFF VALUE OF 0.48 ug/mL FEU, THIS ASSAY HAS  BEEN DOCUMENTED IN THE LITERATURE TO HAVE    Radiology/Studies:  Dg Chest 1 View  03/11/2015   CLINICAL DATA:  Headache with syncope for 4 or 5 days. Slight chest pain and shortness of breath. Initial encounter.  EXAM: CHEST  1 VIEW  COMPARISON:  Radiographs 01/10/2015.  Abdominal CT 10/08/2014.  FINDINGS: 1041 hr. The heart size and mediastinal contours are normal. The lungs are clear. There is no pleural effusion or pneumothorax. No acute osseous findings are identified.  IMPRESSION: No active cardiopulmonary process.   Electronically Signed   By: Richardean Sale M.D.   On: 03/11/2015 10:58   Dg Abd 1 View  04/05/2015   CLINICAL DATA:  Epigastric abdominal pain  EXAM: ABDOMEN - 1 VIEW  COMPARISON:  04/03/2015  FINDINGS: Scattered large and small bowel gas is noted. Fecal material is noted within the colon. Additionally contrast from recent CT examination is seen within the colon. No obstructive changes are noted. No free air is seen. No acute bony abnormality is noted.  IMPRESSION: No acute abnormality noted.   Electronically Signed   By: Inez Catalina M.D.   On: 04/05/2015 09:03   Ct Head Wo Contrast  03/11/2015   CLINICAL DATA:  Diffuse headache for 3 days, more severe on the right than on left. History of prior cervical carcinoma.  EXAM: CT HEAD WITHOUT CONTRAST  TECHNIQUE: Contiguous axial images were obtained from the base of the skull through the vertex without intravenous contrast.  COMPARISON:  Head CT February 15, 2015 and brain MRI February 16, 2015  FINDINGS: The ventricles and sulci appear within normal limits with respect to size and configuration for age. There is no intracranial mass, hemorrhage, extra-axial fluid collection, or midline shift. Gray-white compartments appear within normal limits. No acute infarct evident. The bony calvarium appears intact. The mastoid air cells are clear.  IMPRESSION: No intracranial mass, hemorrhage, or  focal gray - white compartment lesions/acute appearing infarct. No appreciable change compared to recent prior study.   Electronically Signed   By: Lowella Grip III M.D.   On: 03/11/2015 11:07   Ct Abdomen Pelvis W Contrast  04/03/2015   CLINICAL DATA:  Abdominal pain, nausea and vomiting for 3 days, worsening. Initial encounter.  EXAM: CT ABDOMEN AND PELVIS WITH CONTRAST  TECHNIQUE: Multidetector CT imaging of the abdomen and pelvis was performed using the standard protocol following bolus administration of intravenous contrast.  CONTRAST:  100 mL OMNIPAQUE IOHEXOL 300 MG/ML  SOLN  COMPARISON:  CT abdomen and pelvis 10/08/2014 and 07/16/2013.  FINDINGS: Mild dependent atelectasis is seen in the lung bases. No pleural or pericardial effusion.  Nodular border of the liver consistent with cirrhosis is seen and was present on the prior exam. No focal liver lesion is identified. The patient is status post cholecystectomy. The adrenal glands, spleen and pancreas all appear normal. The patient has nonobstructing stones in the lower pole of both the right and left kidney measuring 0.5 cm on the left and 0.7 cm on the right. Scattered aortoiliac atherosclerosis without aneurysm is identified.  The patient is status post hysterectomy. There is a large volume of stool in the ascending colon to the hepatic flexure. The colon is otherwise unremarkable. The stomach and small bowel appear normal. There is no lymphadenopathy or fluid.  No lytic or sclerotic bony lesion is identified. Convex left scoliosis is noted.  IMPRESSION: No acute abnormality.  Large stool burden ascending colon through the hepatic flexure.  Cirrhosis.  Single bilateral  nonobstructing renal stones.  Atherosclerosis.   Electronically Signed   By: Inge Rise M.D.   On: 04/03/2015 07:38    EKG:  7/24: sinus tachycardia, 108 bpm, left atrial enlargement, cannot rule out anterior infarct 7/26: Afib with RVR, 164 bpm, 2 mm st depression lead I,  deep TWI III, TWI aVF, V6, possible anterior infarct, nonspecific st/t changes leads II, V4-V5  Weights: Filed Weights   04/03/15 0054  Weight: 130 lb (58.968 kg)     Physical Exam: Blood pressure 122/70, pulse 49, temperature 98.6 F (37 C), temperature source Oral, resp. rate 14, height 5' 3"  (1.6 m), weight 130 lb (58.968 kg), SpO2 98 %. Body mass index is 23.03 kg/(m^2). General: Well developed, well nourished, in no acute distress. Head: Normocephalic, atraumatic, sclera non-icteric, no xanthomas, nares are without discharge.  Neck: Negative for carotid bruits. JVD not elevated. Lungs: Clear bilaterally to auscultation without wheezes, rales, or rhonchi. Breathing is unlabored. Heart: Tachycardiac, irregularly-irregular, with S1 S2. No murmurs, rubs, or gallops appreciated, though rate was 140-150s during exam. Episodes of SVT into the 170's during exam that would resolve with valsalva maneuver with return of Afib with RVR into the low 100's that would quickly go back up into the 120's to 130's.     Abdomen: Soft, TTP epigastric region, non-distended with normoactive bowel sounds. No hepatomegaly. No rebound/guarding. No obvious abdominal masses. Msk:  Strength and tone appear normal for age. Extremities: No clubbing or cyanosis. No edema.  Distal pedal pulses are 2+ and equal bilaterally. Neuro: Alert and oriented X 3. No facial asymmetry. No focal deficit. Moves all extremities spontaneously. Psych:  Responds to questions appropriately with a normal affect.    Assessment and Plan:  59 y.o. female with h/o reported arrhythmia per [facility], TIA, syncope, pancreatitis, type 1 diabetes, cholelithiasis without obstruction, diverticulitis, eosinophilic esophagitis s/p Nissen fundoplication 0/05/3266 who presented to [facility] on 04/03/2015 with nausea and vomiting and subsequently developed Afib with RVR and intermittent SVT with HR into the 180s and 200s at times.  1. New onset  Afib: -With heart rate up into the 180's there is likely an underlying driving force causing her heart rate to be so tachycardiac  -EKG from this morning is concerning for acute ischemic event, possibly underlying driving force -Start amiodarone gtt with bolus for rate control  -Previously on Lovenox, change to heparin gtt given the possibility of ischemic event, new onset Afib , and should acute intervention be required heparin would be a better choice than Lovenox  -Continue diltiazem gtt, currently on 5 mg/hr, titrate up as needed for added rate control  -Recent subclinical hypothyroid  -CHADSVASc at least 4, (DM, TIA x 2, female) giving the patient an estimated annual yearly risk of stroke at 4.0%  2. Elevated troponin/chest pain radiating to the back: -Possibly 2/2 the above Afib with RVR vs ischemic event/ACS -Continue to trend troponin, though will need to interpret with care given #1 -Heparin gtt as above -Recent echo as above, would update at this time to evaluate LV function when rate controlled  -Would consider possible cardiac cath when stable given chest pain and EKG changes  3. Nausea/vomiting/ABD pain: -Intractable -Possibly her anginal equivalent  -Per #2 -Continue to treat symptoms at this time  4. DM1: -Per IM  5. HLD: -Lipitor  6. Hypokalemia: -Replete to 4.0 -Check mag    Melvern Banker, PA-C Pager: [phone] 04/05/2015, 12:37 PM  Attending Note Patient seen and examined, agree  with detailed note above,  Patient presentation and plan discussed on rounds.   Intractable nausea on admission This a.m. converting to atrial fibrillation with RVR, heart rate up to 180 bpm Short period of normal sinus rhythm at 9 AM, then resuming arrhythmia with recurrent atrial fibrillation, runs of SVT Initially asymptomatic, then later was able to appreciate the tachycardia On Cardizem drip 15 mg per hour no significant improvement in her heart rate or  rhythm --We will recommended amiodarone bolus with drip/infusion be started for acute onset of her arrhythmia After several hours, she converted to normal sinus rhythm. --- We have recommended she continue on Cardizem 5 mg per hour with amiodarone protocol. When she is able to tolerate full diet, even liquid diet with no nausea, amiodarone and Cardizem could be changed to by mouth --Etiology of her nausea is unclear. If symptoms persist, may benefit from GI workup She does have cirrhosis, large amount of stool on CT scan   Signed: Esmond Plants  M.D., Ph.D. [facility]

## 2015-04-05 NOTE — Progress Notes (Signed)
Received call from [facility] stating that pt was in a-fib w/ a HR of 165.  Upon entering pts room BP was checked and read 118/79 and pt was indeed tachycardic w/ a HR of 168.  Pt stated that she was symptomatic and felt palpitations.  Pt was not diaphoretic and denied chest pain, radiating pain to arms jaw and/or back.  Pt denied any difficulty breathing.  Pt's past tele strips were checked and compared.  No hx of increased HR or a-fib during this admission.  Pt does not have a hx noted of a-fib.    Dr. Benjie Karvonen was paged and returned the call.    Orders received: STAT 12 Lead EKG, KUB, Fleets Enema.    At this time pt is connected to BP machine as well as telemetry.  Will administer scheduled medications at this time.  Will continue to follow out orders and notify MD of any changes.

## 2015-04-05 NOTE — Progress Notes (Signed)
Dr. Reece Levy notified of pt continuing to have nausea. Not time for zofran at this time.  MD ordered zofran 62m once.

## 2015-04-05 NOTE — Progress Notes (Signed)
A&Ox4.  Patient reports feeling better this evening. Ate 10% of dinner. Pain controlled with PRN IV morphine. No vomiting but did have nausea with dry heaves during shift. NSR per cardiac monitor on cardizem and amio drips. VSS.  Parul Porcelli B

## 2015-04-05 NOTE — Progress Notes (Signed)
RN notified Dr. Rockey Situ that troponin is 0.08, and RN asked if cardizem drip should run with amio drip and how to titrate? Dr. Rockey Situ stated "yes, run both the amio and cardizem drips at the same time and if you need to titrate then titrate the cardizem." no further orders.

## 2015-04-05 NOTE — Care Management (Addendum)
Attempted to meet with patient in ICU bed 2 but patient was feeling uncomfortable. She did however tell me that she was happy with [facility] and that they were still following her for home health. I have sent message to Sharrie Rothman with [facility] that patient is in ICU. Unsure if patient was set up with [facility] or not but may be beneficial.  04/05/15 1300: Received notification back for [facility] that they were never able to open this patient either (as previous home health [facility]) and patient's PCP cancelled home health. Patient was never opened because they "couldn't find her or she wouldn't allow them to see her". RNCM will need to follow up with patient when she is more stable.

## 2015-04-05 NOTE — Consult Note (Signed)
WOC wound consult note Reason for Consult: Unstageable pressure ulcer to left lateral thigh.  Present on admission.  States she has [place] that helps with this.  Wound type:Unstageable Pressure ulcer Pressure Ulcer POA: Yes Measurement:2 cm x 2 cm ulcer with nonintact center measuring 1.5 cm x 1 cm wound bed is 100% moist dark slough Wound JGY:[id number] tissue Drainage (amount, consistency, odor) Minimal serosanguinous drainage. No odor.  Periwound:Intact Dressing procedure/placement/frequency:Cleanse ulcer to left lateral thigh with NS and pat gently dry.  Apply Santyl ointment, 1/8 inch thickness (opaque).  Cover with NS moist dressing.  Secure with ABD pad and tape.  Change daily.  Will not follow at this time.  Please re-consult if needed.  Domenic Moras RN BSN [place] Pager [phone]

## 2015-04-05 NOTE — Consult Note (Addendum)
ANTICOAGULATION CONSULT NOTE - Follow Up Consult  Pharmacy Consult for heparin Indication: afib/possible ACS  Allergies  Allergen Reactions  . Erythromycin Base Other (See Comments)    Fever  . Metformin     REACTION: Loose stools  . Pioglitazone Hcl-Metformin Hcl     REACTION: Diarrhea  . Rosiglitazone Maleate     REACTION: Swelling  . Sitagliptin-Metformin Hcl     REACTION: Rash  . Codeine Sulfate Rash  . Tetanus-Diphtheria Toxoids Td Rash and Other (See Comments)    Fever    Patient Measurements: Height: 5' 3"  (160 cm) Weight: 130 lb (58.968 kg) IBW/kg (Calculated) : 52.4 Heparin Dosing Weight: 59  Vital Signs: Temp: 98.3 F (36.8 C) (07/26 1900) Temp Source: Oral (07/26 1900) BP: 109/54 mmHg (07/26 1900) Pulse Rate: 75 (07/26 1900)  Labs:  Recent Labs  04/03/15 0059 04/03/15 0227  04/03/15 1826 04/04/15 0038 04/05/15 1059 04/05/15 1100 04/05/15 1853  HGB  --  13.6  --   --  12.2  --   --   --   HCT  --  40.6  --   --  36.5  --   --   --   PLT  --  151  --   --  107*  --   --   --   APTT  --   --   --   --   --   --  30  --   LABPROT  --   --   --   --   --   --  16.1*  --   INR  --   --   --   --   --   --  1.27  --   HEPARINUNFRC  --   --   --   --   --   --   --  0.83*  CREATININE 1.03*  --   --   --  0.79  --   --   --   CKTOTAL  --   --   --   --   --  46  --   --   TROPONINI 0.03  --   < > 0.05* 0.04* 0.08*  --   --   < > = values in this interval not displayed.  Estimated Creatinine Clearance: 61.1 mL/min (by C-G formula based on Cr of 0.79).   Medications:  Scheduled:  . atorvastatin  40 mg Oral q1800  . citalopram  20 mg Oral Daily  . collagenase   Topical Daily  . dicyclomine  10 mg Oral QID  . docusate sodium  100 mg Oral BID  . feeding supplement (ENSURE ENLIVE)  237 mL Oral BID BM  . hydrALAZINE  25 mg Oral 3 times per day  . insulin aspart  0-5 Units Subcutaneous QHS  . insulin aspart  0-9 Units Subcutaneous TID WC  . lisinopril   5 mg Oral Daily  . metoCLOPramide  10 mg Oral TID AC  . metoprolol succinate  50 mg Oral Daily  . morphine  100 mg Oral BID  . pantoprazole (PROTONIX) IV  40 mg Intravenous Q12H  . potassium chloride  40 mEq Oral Once  . senna  1 tablet Oral BID  . sodium chloride  3 mL Intravenous Q12H  . sodium chloride  3 mL Intravenous Q12H  . sodium phosphate  1 enema Rectal Once  . zolpidem  5 mg Oral QHS   Infusions:  . sodium  chloride 75 mL/hr at 04/05/15 1347  . amiodarone    . diltiazem (CARDIZEM) infusion 5 mg/hr (04/05/15 1438)  . heparin 850 Units/hr (04/05/15 1329)    Assessment: Patient heparin level is supratherapeutic Goal of Therapy:  Heparin level 0.3-0.7 units/ml Monitor platelets by anticoagulation protocol: Yes   Plan:  Decrease rate about 1u/kg/hr (instead of 2u/kg/hr) since pt received lovenox this AM and may ally elevate level . Decrease to 800 units/hr. Recheck in 6 hours. CBC in AM  Jalesha Plotz D Kayla Weekes 04/05/2015,7:50 PM

## 2015-04-05 NOTE — Progress Notes (Signed)
Dr. Benjie Karvonen notified of EKG results  and pt to be transferred to ICU.

## 2015-04-05 NOTE — Progress Notes (Signed)
ANTICOAGULATION CONSULT NOTE - Initial Consult  Pharmacy Consult for Heparin Indication: atrial fibrillation/ACS  Allergies  Allergen Reactions  . Erythromycin Base Other (See Comments)    Fever  . Metformin     REACTION: Loose stools  . Pioglitazone Hcl-Metformin Hcl     REACTION: Diarrhea  . Rosiglitazone Maleate     REACTION: Swelling  . Sitagliptin-Metformin Hcl     REACTION: Rash  . Codeine Sulfate Rash  . Tetanus-Diphtheria Toxoids Td Rash and Other (See Comments)    Fever    Patient Measurements: Height: 5' 3"  (160 cm) Weight: 130 lb (58.968 kg) IBW/kg (Calculated) : 52.4  Vital Signs: Temp: 98.6 F (37 C) (07/26 0848) Temp Source: Oral (07/26 0848) BP: 122/70 mmHg (07/26 1030) Pulse Rate: 49 (07/26 1030)  Labs:  Recent Labs  04/03/15 0059 04/03/15 0227 04/03/15 1311 04/03/15 1826 04/04/15 0038  HGB  --  13.6  --   --  12.2  HCT  --  40.6  --   --  36.5  PLT  --  151  --   --  107*  CREATININE 1.03*  --   --   --  0.79  TROPONINI 0.03  --  0.05* 0.05* 0.04*    Estimated Creatinine Clearance: 61.1 mL/min (by C-G formula based on Cr of 0.79).   Medical History: Past Medical History  Diagnosis Date  . Diabetes mellitus   . Hypothyroid   . Degenerative disk disease   . Stomach ulcer   . Diverticulitis   . Syncope 01/2015  . Anxiety   . GERD (gastroesophageal reflux disease)   . History of hiatal hernia   . Cancer     HX OF CANCER OF UTERUS   . TIA (transient ischemic attack)   . Hypertension   . Pancreatitis     Medications:  Scheduled:  . amiodarone  150 mg Intravenous Once  . atorvastatin  40 mg Oral q1800  . citalopram  20 mg Oral Daily  . collagenase   Topical Daily  . dicyclomine  10 mg Oral QID  . docusate sodium  100 mg Oral BID  . feeding supplement (ENSURE ENLIVE)  237 mL Oral BID BM  . hydrALAZINE  25 mg Oral 3 times per day  . insulin aspart  0-5 Units Subcutaneous QHS  . insulin aspart  0-9 Units Subcutaneous TID WC  .  lisinopril  5 mg Oral Daily  . metoCLOPramide  10 mg Oral TID AC  . metoprolol succinate  50 mg Oral Daily  . morphine  100 mg Oral BID  . pantoprazole (PROTONIX) IV  40 mg Intravenous Q12H  . potassium chloride  40 mEq Oral Once  . senna  1 tablet Oral BID  . sodium chloride  3 mL Intravenous Q12H  . sodium chloride  3 mL Intravenous Q12H  . sodium phosphate  1 enema Rectal Once  . zolpidem  5 mg Oral QHS   Infusions:  . sodium chloride 75 mL/hr at 04/05/15 0150  . amiodarone     Followed by  . amiodarone    . diltiazem (CARDIZEM) infusion 5 mg/hr (04/05/15 0956)  . heparin     PRN: acetaminophen **OR** acetaminophen, alum & mag hydroxide-simeth, hydrALAZINE, HYDROcodone-acetaminophen, morphine injection, ondansetron **OR** ondansetron (ZOFRAN) IV, polyethylene glycol, senna-docusate, tiZANidine  Assessment: 59 y/o F with new-onset afib and possible ACS ordered heparin drip. Patient received 40 mg Lovenox this AM.   Goal of Therapy:  Heparin level 0.3-0.7 units/ml Monitor platelets  by anticoagulation protocol: Yes   Plan:  Will begin heparin drip without bolus at 850 units/hr and check HL in 6 h.   Ulice Dash D 04/05/2015,11:32 AM

## 2015-04-06 DIAGNOSIS — I248 Other forms of acute ischemic heart disease: Secondary | ICD-10-CM

## 2015-04-06 DIAGNOSIS — E876 Hypokalemia: Secondary | ICD-10-CM | POA: Clinically undetermined

## 2015-04-06 DIAGNOSIS — E109 Type 1 diabetes mellitus without complications: Secondary | ICD-10-CM | POA: Diagnosis present

## 2015-04-06 DIAGNOSIS — E785 Hyperlipidemia, unspecified: Secondary | ICD-10-CM | POA: Diagnosis present

## 2015-04-06 LAB — HEPARIN LEVEL (UNFRACTIONATED)
Heparin Unfractionated: 0.64 IU/mL (ref 0.30–0.70)
Heparin Unfractionated: 0.63 [IU]/mL (ref 0.30–0.70)

## 2015-04-06 LAB — TROPONIN I: Troponin I: 0.15 ng/mL — ABNORMAL HIGH (ref <0.031)

## 2015-04-06 LAB — BASIC METABOLIC PANEL WITH GFR
Anion gap: 4 — ABNORMAL LOW (ref 5–15)
BUN: 26 mg/dL — ABNORMAL HIGH (ref 6–20)
CO2: 25 mmol/L (ref 22–32)
Calcium: 7.9 mg/dL — ABNORMAL LOW (ref 8.9–10.3)
Chloride: 109 mmol/L (ref 101–111)
Creatinine, Ser: 0.9 mg/dL (ref 0.44–1.00)
GFR calc Af Amer: >60 mL/min
GFR calc non Af Amer: >60 mL/min
Glucose, Bld: 271 mg/dL — ABNORMAL HIGH (ref 65–99)
Potassium: 3.2 mmol/L — ABNORMAL LOW (ref 3.5–5.1)
Sodium: 138 mmol/L (ref 135–145)

## 2015-04-06 LAB — CBC
HCT: 32.4 % — ABNORMAL LOW (ref 35.0–47.0)
Hemoglobin: 10.7 g/dL — ABNORMAL LOW (ref 12.0–16.0)
MCH: 32.3 pg (ref 26.0–34.0)
MCHC: 33 g/dL (ref 32.0–36.0)
MCV: 97.8 fL (ref 80.0–100.0)
Platelets: 104 K/uL — ABNORMAL LOW (ref 150–440)
RBC: 3.31 MIL/uL — ABNORMAL LOW (ref 3.80–5.20)
RDW: 17 % — ABNORMAL HIGH (ref 11.5–14.5)
WBC: 8.6 K/uL (ref 3.6–11.0)

## 2015-04-06 LAB — GLUCOSE, CAPILLARY
GLUCOSE-CAPILLARY: 276 mg/dL — AB (ref 65–99)
Glucose-Capillary: 219 mg/dL — ABNORMAL HIGH (ref 65–99)
Glucose-Capillary: 307 mg/dL — ABNORMAL HIGH (ref 65–99)
Glucose-Capillary: 169 mg/dL — ABNORMAL HIGH (ref 65–99)

## 2015-04-06 LAB — SURGICAL PATHOLOGY

## 2015-04-06 MED ORDER — DILTIAZEM HCL ER COATED BEADS 120 MG PO CP24
120.0000 mg | ORAL_CAPSULE | Freq: Every day | ORAL | Status: DC
  Administered 2015-04-06 – 2015-04-07 (×2): 120 mg via ORAL
  Filled 2015-04-06 (×2): qty 1

## 2015-04-06 MED ORDER — POTASSIUM CHLORIDE 20 MEQ PO PACK
20.0000 meq | PACK | Freq: Two times a day (BID) | ORAL | Status: DC
  Filled 2015-04-06: qty 1

## 2015-04-06 MED ORDER — INSULIN GLARGINE 100 UNIT/ML ~~LOC~~ SOLN
10.0000 [IU] | Freq: Every day | SUBCUTANEOUS | Status: DC
  Administered 2015-04-06: 10 [IU] via SUBCUTANEOUS
  Filled 2015-04-06 (×3): qty 0.1

## 2015-04-06 MED ORDER — METOPROLOL SUCCINATE ER 50 MG PO TB24
50.0000 mg | ORAL_TABLET | Freq: Every day | ORAL | Status: DC
  Administered 2015-04-07: 50 mg via ORAL
  Filled 2015-04-06: qty 1

## 2015-04-06 MED ORDER — AMIODARONE HCL 200 MG PO TABS
400.0000 mg | ORAL_TABLET | Freq: Two times a day (BID) | ORAL | Status: DC
  Administered 2015-04-06 – 2015-04-07 (×3): 400 mg via ORAL
  Filled 2015-04-06 (×3): qty 2

## 2015-04-06 MED ORDER — POTASSIUM CHLORIDE 10 MEQ/100ML IV SOLN
10.0000 meq | INTRAVENOUS | Status: AC
  Administered 2015-04-06 (×4): 10 meq via INTRAVENOUS
  Filled 2015-04-06 (×4): qty 100

## 2015-04-06 NOTE — Progress Notes (Signed)
Pt is alert and oriented. C/o abdo.pain. And Nausea. Inj morphine and zofran givenx2. SR on Cm. Lung sound diminished. Amiadrone cardizem heparin and Ns is in progress.  UOP good. Resting comfortably in bed without any disrtess. Continue to observe closely.

## 2015-04-06 NOTE — Progress Notes (Signed)
Report called to Janett Billow, RN on 2A.

## 2015-04-06 NOTE — Care Management (Signed)
This patient is open to [facility] not [place]. Per Dian Situ with [facility] patient is very difficult to get in contact with but they have been able to arrange a home health schedule. Dian Situ is aware that patient is back in the hospital.

## 2015-04-06 NOTE — Progress Notes (Signed)
RN made Christell Faith, PA aware that Dr. Benjie Karvonen placed orders for patient to be moved to 2A and that patient has remained in NSR with drips off. Dunn, PA stated "thats okay."

## 2015-04-06 NOTE — Consult Note (Signed)
ANTICOAGULATION CONSULT NOTE - Follow Up Consult  Pharmacy Consult for heparin Indication: afib/possible ACS  Allergies  Allergen Reactions  . Erythromycin Base Other (See Comments)    Fever  . Metformin     REACTION: Loose stools  . Pioglitazone Hcl-Metformin Hcl     REACTION: Diarrhea  . Rosiglitazone Maleate     REACTION: Swelling  . Sitagliptin-Metformin Hcl     REACTION: Rash  . Codeine Sulfate Rash  . Tetanus-Diphtheria Toxoids Td Rash and Other (See Comments)    Fever    Patient Measurements: Height: 5' 3"  (160 cm) Weight: 130 lb (58.968 kg) IBW/kg (Calculated) : 52.4 Heparin Dosing Weight: 59  Vital Signs: Temp: 99.2 F (37.3 C) (07/27 0400) Temp Source: Oral (07/27 0400) BP: 100/53 mmHg (07/27 0400) Pulse Rate: 92 (07/27 0500)  Labs:  Recent Labs  04/04/15 0038 04/05/15 1059 04/05/15 1100 04/05/15 1853 04/05/15 2325 04/06/15 0427  HGB 12.2  --   --   --   --  10.7*  HCT 36.5  --   --   --   --  32.4*  PLT 107*  --   --   --   --  104*  APTT  --   --  30  --   --   --   LABPROT  --   --  16.1*  --   --   --   INR  --   --  1.27  --   --   --   HEPARINUNFRC  --   --   --  0.83*  --  0.64  CREATININE 0.79  --   --   --   --  0.90  CKTOTAL  --  46  --   --   --   --   TROPONINI 0.04* 0.08*  --  0.16* 0.15*  --     Estimated Creatinine Clearance: 54.3 mL/min (by C-G formula based on Cr of 0.9).   Medications:  Scheduled:  . atorvastatin  40 mg Oral q1800  . citalopram  20 mg Oral Daily  . collagenase   Topical Daily  . dicyclomine  10 mg Oral QID  . docusate sodium  100 mg Oral BID  . feeding supplement (ENSURE ENLIVE)  237 mL Oral BID BM  . hydrALAZINE  25 mg Oral 3 times per day  . insulin aspart  0-5 Units Subcutaneous QHS  . insulin aspart  0-9 Units Subcutaneous TID WC  . lisinopril  5 mg Oral Daily  . metoCLOPramide  10 mg Oral TID AC  . metoprolol succinate  50 mg Oral Daily  . morphine  100 mg Oral BID  . pantoprazole (PROTONIX)  IV  40 mg Intravenous Q12H  . potassium chloride  40 mEq Oral Once  . senna  1 tablet Oral BID  . sodium chloride  3 mL Intravenous Q12H  . sodium chloride  3 mL Intravenous Q12H  . sodium phosphate  1 enema Rectal Once  . zolpidem  5 mg Oral QHS   Infusions:  . sodium chloride 75 mL/hr at 04/06/15 0339  . amiodarone 30 mg/hr (04/06/15 0032)  . diltiazem (CARDIZEM) infusion 5 mg/hr (04/05/15 1438)  . heparin 800 Units/hr (04/05/15 2000)    Assessment: Patient heparin level is supratherapeutic Goal of Therapy:  Heparin level 0.3-0.7 units/ml Monitor platelets by anticoagulation protocol: Yes   Plan:  Decrease rate about 1u/kg/hr (instead of 2u/kg/hr) since pt received lovenox this AM and may ally elevate  level . Decrease to 800 units/hr. Recheck in 6 hours. CBC in AM  7/27 04:30 anti-Xa 0.64. Recheck in 6 hours to confirm.  Albertia Carvin S 04/06/2015,5:23 AM

## 2015-04-06 NOTE — Care Management (Addendum)
Patient better today; may transfer to acute level of care. Patient is open to [facility] for PT/RN. Lantus added. Left thigh wound treating with Santel. Please update home health orders.

## 2015-04-06 NOTE — Progress Notes (Signed)
[place] [facility] [place] NAME: [name]    MR#:  [id number]  DATE OF BIRTH:  04-02-1956  SUBJECTIVE:  Patient is doing better this morning. She denies abdominal pain, nausea or vomiting. She has converted from nature fibrillation to normal sinus rhythm.  REVIEW OF SYSTEMS:    Review of Systems  Constitutional: Negative for fever, chills and malaise/fatigue.  HENT: Negative for sore throat.   Eyes: Negative for blurred vision and discharge.  Respiratory: Negative for cough, hemoptysis, shortness of breath and wheezing.   Cardiovascular: Negative for chest pain, palpitations, orthopnea, claudication and leg swelling.  Gastrointestinal: Negative for nausea, vomiting, abdominal pain, diarrhea, constipation and blood in stool.  Genitourinary: Negative for dysuria.  Musculoskeletal: Negative for back pain.  Neurological: Negative for dizziness, tremors and headaches.  Endo/Heme/Allergies: Does not bruise/bleed easily.    Tolerating Diet: yes     DRUG ALLERGIES:   Allergies  Allergen Reactions  . Erythromycin Base Other (See Comments)    Fever  . Metformin     REACTION: Loose stools  . Pioglitazone Hcl-Metformin Hcl     REACTION: Diarrhea  . Rosiglitazone Maleate     REACTION: Swelling  . Sitagliptin-Metformin Hcl     REACTION: Rash  . Codeine Sulfate Rash  . Tetanus-Diphtheria Toxoids Td Rash and Other (See Comments)    Fever    VITALS:  Blood pressure 109/53, pulse 93, temperature 98.2 F (36.8 C), temperature source Oral, resp. rate 16, height 5' 3"  (1.6 m), weight 58.968 kg (130 lb), SpO2 94 %.  PHYSICAL EXAMINATION:   Physical Exam  Constitutional: She is oriented to person, place, and time and well-developed, well-nourished, and in no distress. No distress.  HENT:  Head: Normocephalic.  Eyes: No scleral icterus.  Neck: Normal range of motion. Neck supple. No JVD present. No tracheal deviation present.   Cardiovascular: Normal heart sounds.  Exam reveals no gallop and no friction rub.   No murmur heard. Pulmonary/Chest: Effort normal and breath sounds normal. No respiratory distress. She has no wheezes. She has no rales. She exhibits no tenderness.  Abdominal: Soft. Bowel sounds are normal. She exhibits no distension and no mass. There is no tenderness (epigastric region). There is no rebound and no guarding.  Musculoskeletal: Normal range of motion. She exhibits no edema.  Neurological: She is alert and oriented to person, place, and time.  Skin: Skin is warm. No rash noted. No erythema.  Psychiatric: Affect and judgment normal.      LABORATORY PANEL:   CBC  Recent Labs Lab 04/06/15 0427  WBC 8.6  HGB 10.7*  HCT 32.4*  PLT 104*   ------------------------------------------------------------------------------------------------------------------  Chemistries   Recent Labs Lab 04/03/15 0059  04/05/15 1853 04/06/15 0427  NA 137  < >  --  138  K 3.8  < > 3.5 3.2*  CL 101  < >  --  109  CO2 26  < >  --  25  GLUCOSE 413*  < >  --  271*  BUN 25*  < >  --  26*  CREATININE 1.03*  < >  --  0.90  CALCIUM 8.9  < >  --  7.9*  MG  --   --  1.2*  --   AST 52*  --   --   --   ALT 44  --   --   --   ALKPHOS 93  --   --   --  BILITOT 2.0*  --   --   --   < > = values in this interval not displayed. ------------------------------------------------------------------------------------------------------------------  Cardiac Enzymes  Recent Labs Lab 04/05/15 1059 04/05/15 1853 04/05/15 2325  TROPONINI 0.08* 0.16* 0.15*   ------------------------------------------------------------------------------------------------------------------  RADIOLOGY:  Dg Abd 1 View  04/05/2015   CLINICAL DATA:  Epigastric abdominal pain  EXAM: ABDOMEN - 1 VIEW  COMPARISON:  04/03/2015  FINDINGS: Scattered large and small bowel gas is noted. Fecal material is noted within the colon. Additionally  contrast from recent CT examination is seen within the colon. No obstructive changes are noted. No free air is seen. No acute bony abnormality is noted.  IMPRESSION: No acute abnormality noted.   Electronically Signed   By: Inez Catalina M.D.   On: 04/05/2015 09:03   Dg Chest Port 1 View  04/05/2015   CLINICAL DATA:  Nausea.  Chest pain  EXAM: PORTABLE CHEST - 1 VIEW  COMPARISON:  None.  FINDINGS: The heart size and mediastinal contours are within normal limits. Both lungs are clear. The visualized skeletal structures are unremarkable.  IMPRESSION: No active disease.   Electronically Signed   By: Kerby Moors M.D.   On: 04/05/2015 13:36     ASSESSMENT AND PLAN:   This is 59 year old female with a history of chronic pain and GERD who presented with epigastric abdominal pain and subsequently has new-onset atrial fibrillation while in the hospital.  1. Epigastric abdominal pain: Patient underwent an EGD which showed diffuse gastritis. She may have underlying gastroparesis as well. Patient will continue on PPI twice a day as well as Reglan.She will continue on full liquid diet and when tolerated advance to low residual diet.   2. Diabetes without complication: Continue sliding scale insulin and Lantus.  3. Hyperlipidemia: Patient continue on atorvastatin.  4. Chronic lower back pain: Patient continue her outpatient pain medications. 5. New onset atrial fibrillation: Patient has now converted to normal sinus rhythm. Patient was on amiodarone and diltiazem drip. These are being transitioned to oral medications. Chads score is 4 and she may need some anticoagulation prior to discharge. She will also need outpatient stress test. TSH was normal.  6. Hypokalemia: Potassium has been repleted.  7. Elevated troponin: Patient's troponins were elevated due to demand ischemia from atrial fibrillation RVR. She may need an outpatient stress test but there is no indication for acute chronic syndrome at this  time.   Management plans discussed with the patient and she is in agreement.  CODE STATUS: Full TOTAL TIME TAKING CARE OF THIS PATIENT: 30 minutes.   Greater than 50% counseling and coordination of care  POSSIBLE D/C tomorrow, DEPENDING ON CLINICAL CONDITION.   Hannahgrace Lalli M.D on 04/06/2015 at 12:00 PM  Between 7am to 6pm - Pager - [phone] After 6pm go to [email or web address] - password [email or web address]  [facility]  Office  [phone]  CC: Primary care physician; Alvester Chou, NP

## 2015-04-06 NOTE — Progress Notes (Signed)
Patient: [name] / Admit Date: 04/03/2015 / Date of Encounter: 04/06/2015, 8:13 AM   Subjective: More alert today and talkative. Now taking po meds. Still with some nausea without emesis. Ate small amount of dinner without issues. No chest pain or SOB. No palpitations. K+ 3.2 this AM. Troponin peaked at 0.16-->0.15.      Review of Systems: Review of Systems  Constitutional: Positive for malaise/fatigue. Negative for fever, chills, weight loss and diaphoresis.  HENT: Negative for congestion.   Eyes: Negative for discharge and redness.  Respiratory: Negative for cough, hemoptysis, sputum production, shortness of breath and wheezing.   Cardiovascular: Negative for chest pain, palpitations, orthopnea, claudication, leg swelling and PND.  Gastrointestinal: Positive for nausea and abdominal pain. Negative for vomiting.  Musculoskeletal: Negative for myalgias and falls.  Skin: Negative for rash.  Neurological: Positive for weakness. Negative for sensory change, speech change and focal weakness.  Endo/Heme/Allergies: Does not bruise/bleed easily.  Psychiatric/Behavioral: The patient is not nervous/anxious.   All other systems reviewed and are negative.   Objective: Telemetry: Converted to NSR around 1:45 PM on 7/26 and has remained there since with HR in the 80s to 90s Physical Exam: Blood pressure 101/52, pulse 83, temperature 98.2 F (36.8 C), temperature source Oral, resp. rate 16, height 5' 3"  (1.6 m), weight 130 lb (58.968 kg), SpO2 95 %. Body mass index is 23.03 kg/(m^2). General: Well developed, well nourished, in no acute distress. Head: Normocephalic, atraumatic, sclera non-icteric, no xanthomas, nares are without discharge. Neck: Negative for carotid bruits. JVP not elevated. Lungs: Clear bilaterally to auscultation without wheezes, rales, or rhonchi. Breathing is unlabored. Heart: RRR S1 S2. II/VI systolic murmur LUSB radiating to the neck. No rubs or gallops.  Abdomen:  Soft, non-tender, non-distended with normoactive bowel sounds. No rebound/guarding. Extremities: No clubbing or cyanosis. No edema. Distal pedal pulses are 2+ and equal bilaterally. Neuro: Alert and oriented X 3. Moves all extremities spontaneously. Psych:  Responds to questions appropriately with a normal affect.   Intake/Output Summary (Last 24 hours) at 04/06/15 0813 Last data filed at 04/06/15 0800  Gross per 24 hour  Intake 11789.99 ml  Output   1030 ml  Net 10759.99 ml    Inpatient Medications:  . atorvastatin  40 mg Oral q1800  . citalopram  20 mg Oral Daily  . collagenase   Topical Daily  . dicyclomine  10 mg Oral QID  . docusate sodium  100 mg Oral BID  . feeding supplement (ENSURE ENLIVE)  237 mL Oral BID BM  . hydrALAZINE  25 mg Oral 3 times per day  . insulin aspart  0-5 Units Subcutaneous QHS  . insulin aspart  0-9 Units Subcutaneous TID WC  . lisinopril  5 mg Oral Daily  . metoCLOPramide  10 mg Oral TID AC  . metoprolol succinate  50 mg Oral Daily  . morphine  100 mg Oral BID  . pantoprazole (PROTONIX) IV  40 mg Intravenous Q12H  . potassium chloride  40 mEq Oral Once  . senna  1 tablet Oral BID  . sodium chloride  3 mL Intravenous Q12H  . sodium chloride  3 mL Intravenous Q12H  . sodium phosphate  1 enema Rectal Once  . zolpidem  5 mg Oral QHS   Infusions:  . sodium chloride 75 mL/hr at 04/06/15 0339  . amiodarone 30 mg/hr (04/06/15 0032)  . diltiazem (CARDIZEM) infusion 5 mg/hr (04/05/15 1438)  . heparin 800 Units/hr (04/05/15 2000)  Labs:  Recent Labs  04/04/15 0038 04/05/15 1853 04/06/15 0427  NA 140  --  138  K 3.2* 3.5 3.2*  CL 108  --  109  CO2 24  --  25  GLUCOSE 206*  --  271*  BUN 23*  --  26*  CREATININE 0.79  --  0.90  CALCIUM 7.5*  --  7.9*  MG  --  1.2*  --    No results for input(s): AST, ALT, ALKPHOS, BILITOT, PROT, ALBUMIN in the last 72 hours.  Recent Labs  04/04/15 0038 04/06/15 0427  WBC 9.7 8.6  HGB 12.2 10.7*    HCT 36.5 32.4*  MCV 95.8 97.8  PLT 107* 104*    Recent Labs  04/04/15 0038 04/05/15 1059 04/05/15 1853 04/05/15 2325  CKTOTAL  --  46  --   --   TROPONINI 0.04* 0.08* 0.16* 0.15*   Invalid input(s): POCBNP No results for input(s): HGBA1C in the last 72 hours.   Weights: Filed Weights   04/03/15 0054  Weight: 130 lb (58.968 kg)     Radiology/Studies:  Dg Chest 1 View  03/11/2015   CLINICAL DATA:  Headache with syncope for 4 or 5 days. Slight chest pain and shortness of breath. Initial encounter.  EXAM: CHEST  1 VIEW  COMPARISON:  Radiographs 01/10/2015.  Abdominal CT 10/08/2014.  FINDINGS: 1041 hr. The heart size and mediastinal contours are normal. The lungs are clear. There is no pleural effusion or pneumothorax. No acute osseous findings are identified.  IMPRESSION: No active cardiopulmonary process.   Electronically Signed   By: Richardean Sale M.D.   On: 03/11/2015 10:58   Dg Abd 1 View  04/05/2015   CLINICAL DATA:  Epigastric abdominal pain  EXAM: ABDOMEN - 1 VIEW  COMPARISON:  04/03/2015  FINDINGS: Scattered large and small bowel gas is noted. Fecal material is noted within the colon. Additionally contrast from recent CT examination is seen within the colon. No obstructive changes are noted. No free air is seen. No acute bony abnormality is noted.  IMPRESSION: No acute abnormality noted.   Electronically Signed   By: Inez Catalina M.D.   On: 04/05/2015 09:03   Ct Head Wo Contrast  03/11/2015   CLINICAL DATA:  Diffuse headache for 3 days, more severe on the right than on left. History of prior cervical carcinoma.  EXAM: CT HEAD WITHOUT CONTRAST  TECHNIQUE: Contiguous axial images were obtained from the base of the skull through the vertex without intravenous contrast.  COMPARISON:  Head CT February 15, 2015 and brain MRI February 16, 2015  FINDINGS: The ventricles and sulci appear within normal limits with respect to size and configuration for age. There is no intracranial mass, hemorrhage,  extra-axial fluid collection, or midline shift. Gray-white compartments appear within normal limits. No acute infarct evident. The bony calvarium appears intact. The mastoid air cells are clear.  IMPRESSION: No intracranial mass, hemorrhage, or focal gray - white compartment lesions/acute appearing infarct. No appreciable change compared to recent prior study.   Electronically Signed   By: Lowella Grip III M.D.   On: 03/11/2015 11:07   Ct Abdomen Pelvis W Contrast  04/03/2015   CLINICAL DATA:  Abdominal pain, nausea and vomiting for 3 days, worsening. Initial encounter.  EXAM: CT ABDOMEN AND PELVIS WITH CONTRAST  TECHNIQUE: Multidetector CT imaging of the abdomen and pelvis was performed using the standard protocol following bolus administration of intravenous contrast.  CONTRAST:  100 mL OMNIPAQUE IOHEXOL 300  MG/ML  SOLN  COMPARISON:  CT abdomen and pelvis 10/08/2014 and 07/16/2013.  FINDINGS: Mild dependent atelectasis is seen in the lung bases. No pleural or pericardial effusion.  Nodular border of the liver consistent with cirrhosis is seen and was present on the prior exam. No focal liver lesion is identified. The patient is status post cholecystectomy. The adrenal glands, spleen and pancreas all appear normal. The patient has nonobstructing stones in the lower pole of both the right and left kidney measuring 0.5 cm on the left and 0.7 cm on the right. Scattered aortoiliac atherosclerosis without aneurysm is identified.  The patient is status post hysterectomy. There is a large volume of stool in the ascending colon to the hepatic flexure. The colon is otherwise unremarkable. The stomach and small bowel appear normal. There is no lymphadenopathy or fluid.  No lytic or sclerotic bony lesion is identified. Convex left scoliosis is noted.  IMPRESSION: No acute abnormality.  Large stool burden ascending colon through the hepatic flexure.  Cirrhosis.  Single bilateral nonobstructing renal stones.   Atherosclerosis.   Electronically Signed   By: Inge Rise M.D.   On: 04/03/2015 07:38   Dg Chest Port 1 View  04/05/2015   CLINICAL DATA:  Nausea.  Chest pain  EXAM: PORTABLE CHEST - 1 VIEW  COMPARISON:  None.  FINDINGS: The heart size and mediastinal contours are within normal limits. Both lungs are clear. The visualized skeletal structures are unremarkable.  IMPRESSION: No active disease.   Electronically Signed   By: Kerby Moors M.D.   On: 04/05/2015 13:36     Assessment and Plan  59 y.o. female with h/o reported arrhythmia per [facility], TIA, syncope, pancreatitis, type 1 diabetes, cholelithiasis without obstruction, diverticulitis, eosinophilic esophagitis s/p Nissen fundoplication 11/10/2023 who presented to [facility] on 04/03/2015 with nausea and vomiting and subsequently developed Afib with RVR and intermittent SVT with HR into the 180s and 200s at times.  Active Problems:   Atrial fibrillation, new onset   Demand ischemia   Diabetes mellitus type I   Essential hypertension   Hypokalemia   Hyperlipidemia with target LDL less than 100   Abdominal pain   Protein-calorie malnutrition, severe   Constipation   Epigastric pain   Diabetes type 1, uncontrolled    1. New onset Afib: -Currently in NSR since 1:45 PM on 7/26 with HR in the 80s to 90s -With heart rate up into the 180's there is likely an underlying driving force causing her heart rate to be so tachycardiac  -EKG from this morning is concerning for acute ischemic event, possibly underlying driving force -Now tolerating po, will transition from IV amiodarone and IV Cardizem to po forms  -Will place on Cardizem 120 mg daily and amiodarone 400 mg bid x 1 week, 200 mg bid (start on 8/3) x 1 week, and 200 mg (start on 8/10) daily thereafter  -Discontinue Cardizem and amiodarone gtts -Previously on Lovenox, continue heparin gtt given the possibility of ischemic event, new onset Afib , and should acute intervention be  required heparin would be a better choice than Lovenox -Consider inpatient nuclear stress test prior to discharge in the next day or so as she continues to improve   -Recent subclinical hypothyroid  -CHADSVASc at least 4, (DM, TIA x 2, female) giving the patient an estimated annual yearly risk of stroke at 4.0% -Long term will need full dose anticoagulation, prior echo appears to show NOAC would be ok  2. Elevated troponin/chest  pain radiating to the back: Most c/w Demand Ischemia -Flat trending, possibly 2/2  Afib with RVR vs less likely ischemic event/ACS -Heparin gtt as above (also for Afib) -Recent echo as above now that HR stable -Will update today now that she is rate controlled and back in NSR (did not order on 7/26 as she was in Afib with RVR and SVT)  3. Nausea/vomiting/ABD pain: -Intractable -Possibly her anginal equivalent vs GI -- pending GI evaluation -Per #2 -Continue to treat symptoms at this time  4. DM1: -Per IM  5. HLD: -Lipitor  6. Hypokalemia: -Replete to 4.0, she does tolerate po potassium (leads to vomiting, thus this will need to be repleted via IV) -Check mag   7. HTN: parameters for BP meds and when to turn drips off. Dunn, PA stated " hold today's dose of lisinipril and also 1 hour after giving PO cardizem and amioderone RN is to turn off amio and cardizem drips, hold metoprolol for systolic blood pressure less than 100 and d/c hctz."   Signed, Christell Faith, PA-C Pager: [phone] 04/06/2015, 8:13 AM   I have seen, examined and evaluated the patient this AM along with Mr. Idolina Primer, [place] on AM rounds.  After reviewing all the available data and chart,  I agree with his findings, examination as well as impression recommendations.  Finally in NSR & rate better controlled.  Agree with converting to PO meds.   Echo now that HR stable.  Once stable medically, can consider OP Myoview do determine extent of native CAD given + Troponin with Afib  RVR.    Leonie Man, M.D., M.S. Interventional Cardiologist   Pager # [phone]

## 2015-04-06 NOTE — Progress Notes (Signed)
ANTICOAGULATION CONSULT NOTE - Follow Up Consult  Pharmacy Consult for Heparin Indication: afib/r/o ACS  Allergies  Allergen Reactions  . Erythromycin Base Other (See Comments)    Fever  . Metformin     REACTION: Loose stools  . Pioglitazone Hcl-Metformin Hcl     REACTION: Diarrhea  . Rosiglitazone Maleate     REACTION: Swelling  . Sitagliptin-Metformin Hcl     REACTION: Rash  . Codeine Sulfate Rash  . Tetanus-Diphtheria Toxoids Td Rash and Other (See Comments)    Fever    Patient Measurements: Height: 5' 3"  (160 cm) Weight: 130 lb (58.968 kg) IBW/kg (Calculated) : 52.4  Vital Signs: Temp: 98.2 F (36.8 C) (07/27 0730) Temp Source: Oral (07/27 0730) BP: 104/52 mmHg (07/27 1000) Pulse Rate: 87 (07/27 1000)  Labs:  Recent Labs  04/04/15 0038 04/05/15 1059 04/05/15 1100 04/05/15 1853 04/05/15 2325 04/06/15 0427 04/06/15 1008  HGB 12.2  --   --   --   --  10.7*  --   HCT 36.5  --   --   --   --  32.4*  --   PLT 107*  --   --   --   --  104*  --   APTT  --   --  30  --   --   --   --   LABPROT  --   --  16.1*  --   --   --   --   INR  --   --  1.27  --   --   --   --   HEPARINUNFRC  --   --   --  0.83*  --  0.64 0.63  CREATININE 0.79  --   --   --   --  0.90  --   CKTOTAL  --  46  --   --   --   --   --   TROPONINI 0.04* 0.08*  --  0.16* 0.15*  --   --     Estimated Creatinine Clearance: 54.3 mL/min (by C-G formula based on Cr of 0.9).   Medications:  Scheduled:  . amiodarone  400 mg Oral BID  . atorvastatin  40 mg Oral q1800  . citalopram  20 mg Oral Daily  . collagenase   Topical Daily  . dicyclomine  10 mg Oral QID  . diltiazem  120 mg Oral Daily  . docusate sodium  100 mg Oral BID  . feeding supplement (ENSURE ENLIVE)  237 mL Oral BID BM  . insulin aspart  0-5 Units Subcutaneous QHS  . insulin aspart  0-9 Units Subcutaneous TID WC  . lisinopril  5 mg Oral Daily  . metoCLOPramide  10 mg Oral TID AC  . metoprolol succinate  50 mg Oral Daily  .  morphine  100 mg Oral BID  . pantoprazole (PROTONIX) IV  40 mg Intravenous Q12H  . potassium chloride  20 mEq Oral BID  . potassium chloride  40 mEq Oral Once  . senna  1 tablet Oral BID  . sodium chloride  3 mL Intravenous Q12H  . sodium chloride  3 mL Intravenous Q12H  . sodium phosphate  1 enema Rectal Once  . zolpidem  5 mg Oral QHS   Infusions:  . sodium chloride 75 mL/hr at 04/06/15 0339  . heparin 800 Units/hr (04/05/15 2000)   PRN: acetaminophen **OR** acetaminophen, alum & mag hydroxide-simeth, hydrALAZINE, HYDROcodone-acetaminophen, morphine injection, ondansetron **OR** ondansetron (ZOFRAN) IV, polyethylene  glycol, senna-docusate, tiZANidine  Assessment: 59 y/o F with new-onset afib and possible ACS on heparin drip with level at goal.  Goal of Therapy:  Heparin level 0.3-0.7 units/ml Monitor platelets by anticoagulation protocol: Yes   Plan:  Will continue heparin drip at 800 units/hr and f/u AM labs.   Ulice Dash D 04/06/2015,11:27 AM

## 2015-04-06 NOTE — Progress Notes (Signed)
A&Ox4. No complaint of pain at this time. No dry heaving during shift. Tolerating diet better today. NSR per cardiac monitor. VSS. 2A tely monitor applied to patient and verfied with Coralyn Mark, tely clerk. Patient transferred to room 257 by bed with Pam, RN and [place], NT.

## 2015-04-06 NOTE — Progress Notes (Signed)
Inpatient Diabetes Program Recommendations  AACE/ADA: New Consensus Statement on Inpatient Glycemic Control (2013)  Target Ranges:  Prepandial:   less than 140 mg/dL      Peak postprandial:   less than 180 mg/dL (1-2 hours)      Critically ill patients:  140 - 180 mg/dL   Results for [name], [name] (MRN [id number]) as of 04/06/2015 08:53  Ref. Range 04/05/2015 07:45 04/05/2015 13:23 04/05/2015 16:48 04/05/2015 20:38 04/06/2015 07:13  Glucose-Capillary Latest Ref Range: 65-99 mg/dL 230 (H) 314 (H) 332 (H) 315 (H) 219 (H)    Diabetes history: DM2 Outpatient Diabetes medications: Januvia 100 mg daily, Metformin 1000 mg BID Current orders for Inpatient glycemic control: Novolog 0-9 units TID with meals, Novolog 0-5 units HS  Inpatient Diabetes Program Recommendations Insulin - Basal: Fasting glucose 230 mg/dl on 7/26 and 219 mg/dl this morning. Please consider ordering low dose basal insulin; recommend starting with Lantus 10 units Q24H starting now based on 58 kg x 0.2 units). Correction (SSI): Please consider increasing Novolog correction to Moderate scale. Diet: Please consider changing Ensure supplements to Glucerna BID.  Note: Glucose ranged from 230-332 mg/dl on 04/05/15 and patient received a total of Novolog 21 units for correction. Fasting glucose is 219 mg/dl this morning. According to the chart, patient only ate 10% of supper (only meal documented as eaten). Recommend ordering basal insulin, increasing Novolog correction to moderate scale, and changing Ensure to Glucerna BID.  Thanks, Barnie Alderman, RN, MSN, CCRN, CDE Diabetes Coordinator Inpatient Diabetes Program [phone] (Team Pager from [place] to [place]) [phone] ([facility] office) [phone] [facility] office) [phone] [facility] office)

## 2015-04-06 NOTE — Progress Notes (Signed)
RN paged Christell Faith, PA and he called back. RN asked PA about parameters for BP meds and when to turn drips off. Dunn, PA stated " hold today's dose of lisinipril and also 1 hour after giving PO cardizem and amioderone RN is to turn off amio and cardizem drips, hold metoprolol for systolic blood pressure less than 100 and d/c hctz."

## 2015-04-07 ENCOUNTER — Telehealth: Payer: Self-pay

## 2015-04-07 DIAGNOSIS — R112 Nausea with vomiting, unspecified: Secondary | ICD-10-CM

## 2015-04-07 DIAGNOSIS — K5909 Other constipation: Secondary | ICD-10-CM

## 2015-04-07 DIAGNOSIS — E109 Type 1 diabetes mellitus without complications: Secondary | ICD-10-CM

## 2015-04-07 DIAGNOSIS — E785 Hyperlipidemia, unspecified: Secondary | ICD-10-CM

## 2015-04-07 DIAGNOSIS — E876 Hypokalemia: Secondary | ICD-10-CM

## 2015-04-07 DIAGNOSIS — I10 Essential (primary) hypertension: Secondary | ICD-10-CM

## 2015-04-07 DIAGNOSIS — I4891 Unspecified atrial fibrillation: Secondary | ICD-10-CM

## 2015-04-07 LAB — BASIC METABOLIC PANEL
Anion gap: 3 — ABNORMAL LOW (ref 5–15)
BUN: 21 mg/dL — AB (ref 6–20)
CO2: 27 mmol/L (ref 22–32)
CREATININE: 0.76 mg/dL (ref 0.44–1.00)
Calcium: 8.6 mg/dL — ABNORMAL LOW (ref 8.9–10.3)
Chloride: 108 mmol/L (ref 101–111)
GFR calc non Af Amer: >60 mL/min (ref >60)
Glucose, Bld: 187 mg/dL — ABNORMAL HIGH (ref 65–99)
POTASSIUM: 4 mmol/L (ref 3.5–5.1)
SODIUM: 138 mmol/L (ref 135–145)

## 2015-04-07 LAB — GLUCOSE, CAPILLARY
GLUCOSE-CAPILLARY: 199 mg/dL — AB (ref 65–99)
Glucose-Capillary: 254 mg/dL — ABNORMAL HIGH (ref 65–99)

## 2015-04-07 LAB — CBC
HEMATOCRIT: 35.8 % (ref 35.0–47.0)
Hemoglobin: 11.8 g/dL — ABNORMAL LOW (ref 12.0–16.0)
MCH: 32.3 pg (ref 26.0–34.0)
MCHC: 32.9 g/dL (ref 32.0–36.0)
MCV: 98.4 fL (ref 80.0–100.0)
Platelets: 103 10*3/uL — ABNORMAL LOW (ref 150–440)
RBC: 3.64 MIL/uL — ABNORMAL LOW (ref 3.80–5.20)
RDW: 17.3 % — ABNORMAL HIGH (ref 11.5–14.5)
WBC: 6.2 10*3/uL (ref 3.6–11.0)

## 2015-04-07 LAB — HEPARIN LEVEL (UNFRACTIONATED): Heparin Unfractionated: 0.5 IU/mL (ref 0.30–0.70)

## 2015-04-07 LAB — MAGNESIUM: MAGNESIUM: 1.5 mg/dL — AB (ref 1.7–2.4)

## 2015-04-07 MED ORDER — METOCLOPRAMIDE HCL 10 MG PO TABS: 10.0000 mg | ORAL_TABLET | Freq: Three times a day (TID) | ORAL | Status: DC

## 2015-04-07 MED ORDER — AMIODARONE HCL 400 MG PO TABS
400.0000 mg | ORAL_TABLET | Freq: Two times a day (BID) | ORAL | Status: DC
Start: 2015-04-07 — End: 2015-05-31

## 2015-04-07 MED ORDER — PANTOPRAZOLE SODIUM 40 MG PO TBEC: 40.0000 mg | DELAYED_RELEASE_TABLET | Freq: Two times a day (BID) | ORAL | Status: DC

## 2015-04-07 MED ORDER — APIXABAN 5 MG PO TABS
5.0000 mg | ORAL_TABLET | Freq: Two times a day (BID) | ORAL | Status: DC
  Administered 2015-04-07: 5 mg via ORAL
  Filled 2015-04-07: qty 1

## 2015-04-07 MED ORDER — APIXABAN 5 MG PO TABS: 5.0000 mg | ORAL_TABLET | Freq: Two times a day (BID) | ORAL | Status: DC

## 2015-04-07 MED ORDER — COLLAGENASE 250 UNIT/GM EX OINT
TOPICAL_OINTMENT | Freq: Every day | CUTANEOUS | Status: DC
Start: 2015-04-07 — End: 2015-05-31

## 2015-04-07 MED ORDER — SODIUM CHLORIDE 0.9 % IJ SOLN
3.0000 mL | INTRAMUSCULAR | Status: DC | PRN
  Administered 2015-04-07: 3 mL via INTRAVENOUS

## 2015-04-07 MED ORDER — DILTIAZEM HCL ER COATED BEADS 120 MG PO CP24: 120.0000 mg | ORAL_CAPSULE | Freq: Every day | ORAL | Status: DC

## 2015-04-07 NOTE — Telephone Encounter (Signed)
No answer, no VM Will call back

## 2015-04-07 NOTE — Discharge Summary (Signed)
[facility] NAME: [name]    MR#:  [id number]  DATE OF BIRTH:  11/09/1955  DATE OF ADMISSION:  04/03/2015 ADMITTING PHYSICIAN: Bettey Costa, MD  DATE OF DISCHARGE: 04/07/2015  PRIMARY CARE PHYSICIAN: Alvester Chou, NP    ADMISSION DIAGNOSIS:  Epigastric pain [R10.13] Intractable vomiting with nausea, vomiting of unspecified type [R11.10]  DISCHARGE DIAGNOSIS:  Principal Problem:   Nausea and vomiting Active Problems:   Essential hypertension   Abdominal pain   Atrial fibrillation, new onset   Protein-calorie malnutrition, severe   Constipation   Epigastric pain   Demand ischemia   Hypokalemia   Diabetes mellitus type I   Hyperlipidemia with target LDL less than 100   SECONDARY DIAGNOSIS:   Past Medical History  Diagnosis Date  . Type 1 diabetes   . Hypothyroid   . Degenerative disk disease   . Stomach ulcer   . Diverticulitis   . Syncope 01/2015  . Anxiety   . GERD (gastroesophageal reflux disease)   . History of hiatal hernia   . Cancer     HX OF CANCER OF UTERUS   . TIA (transient ischemic attack) 02/2015  . Hypertension   . Pancreatitis     HOSPITAL COURSE:  This is 59 year old female with a history of chronic pain and GERD who presented with epigastric abdominal pain and subsequently has new-onset atrial fibrillation while in the hospital.  1. Epigastric abdominal pain: Patient underwent an EGD which showed diffuse gastritis. She may have underlying gastroparesis as well. Patient will continue on PPI twice a day as well as Reglan.she is tolerating her diet. 2. Diabetes without complication: Continue ADA diet and Lantus.  3. Hyperlipidemia: Patient continue on atorvastatin.  4. Chronic lower back pain: Patient continue her outpatient pain medications. 5. New onset atrial fibrillation: Patient has now converted to normal sinus rhythm. Patient was on amiodarone and diltiazem drip. These are being  transitioned to oral medications. Chads score is 4 and she will need anticoagulation at discharge. She will also need outpatient stress test. TSH was normal. She will follow-up with cardiology in 1 week. At that time amiodarone will be decreased. She'll continue on diltiazem and metoprolol. Her heart rate is controlled. Her blood pressure will tolerate these medications. 6. Hypokalemia: Potassium has been repleted.  7. Elevated troponin: Patient's troponins were elevated due to demand ischemia from atrial fibrillation RVR. She may need an outpatient stress test but there is no indication for acute chronic syndrome at this time.   DISCHARGE CONDITIONS AND DIET:   HOME STABLE CONDITION ON A HEART HEALTHY AND ADA DIET CONSULTS OBTAINED:  Treatment Team:  Hulen Luster, MD Minna Merritts, MD  DRUG ALLERGIES:   Allergies  Allergen Reactions  . Erythromycin Base Other (See Comments)    Fever  . Metformin     REACTION: Loose stools  . Pioglitazone Hcl-Metformin Hcl     REACTION: Diarrhea  . Rosiglitazone Maleate     REACTION: Swelling  . Sitagliptin-Metformin Hcl     REACTION: Rash  . Codeine Sulfate Rash  . Tetanus-Diphtheria Toxoids Td Rash and Other (See Comments)    Fever    DISCHARGE MEDICATIONS:   Current Discharge Medication List    START taking these medications   Details  amiodarone (PACERONE) 400 MG tablet Take 1 tablet (400 mg total) by mouth 2 (two) times daily. Qty: 30 tablet, Refills: 0    apixaban (ELIQUIS) 5 MG  TABS tablet Take 1 tablet (5 mg total) by mouth 2 (two) times daily. Qty: 60 tablet, Refills: 0    collagenase (SANTYL) ointment Apply topically daily. Qty: 15 g, Refills: 0    diltiazem (CARDIZEM CD) 120 MG 24 hr capsule Take 1 capsule (120 mg total) by mouth daily. Qty: 30 capsule, Refills: 0    metoCLOPramide (REGLAN) 10 MG tablet Take 1 tablet (10 mg total) by mouth 3 (three) times daily before meals. Qty: 90 tablet, Refills: 0      CONTINUE  these medications which have CHANGED   Details  pantoprazole (PROTONIX) 40 MG tablet Take 1 tablet (40 mg total) by mouth 2 (two) times daily. Qty: 60 tablet, Refills: 0      CONTINUE these medications which have NOT CHANGED   Details  atorvastatin (LIPITOR) 40 MG tablet Take 1 tablet (40 mg total) by mouth daily at 6 PM. Qty: 30 tablet, Refills: 0    citalopram (CELEXA) 20 MG tablet Take 1 tablet by mouth daily.    dicyclomine (BENTYL) 10 MG capsule Take 1 capsule by mouth 4 (four) times daily.    HYDROcodone-acetaminophen (NORCO/VICODIN) 5-325 MG per tablet Take 1 tablet by mouth every 6 (six) hours as needed for moderate pain.    JANUVIA 100 MG tablet Take 100 mg by mouth daily.    lisinopril (PRINIVIL,ZESTRIL) 5 MG tablet Take 1 tablet by mouth daily.    metFORMIN (GLUCOPHAGE) 1000 MG tablet Take 1 tablet by mouth 2 (two) times daily.    metoprolol succinate (TOPROL-XL) 50 MG 24 hr tablet Take 1 tablet (50 mg total) by mouth daily. Qty: 60 tablet, Refills: 1    morphine (MS CONTIN) 100 MG 12 hr tablet Take 100 mg by mouth 2 (two) times daily.     tiZANidine (ZANAFLEX) 4 MG tablet Take 1 tablet by mouth every 6 (six) hours as needed for muscle spasms.     zolpidem (AMBIEN) 5 MG tablet Take 1 tablet by mouth at bedtime.              Today   CHIEF COMPLAINT:  Doing well this morning she has no complaints. She is in normal sinus rhythm. She has no abdominal pain, nausea or vomiting. She is tolerating her diet.   VITAL SIGNS:  Blood pressure 122/54, pulse 81, temperature 98.9 F (37.2 C), temperature source Oral, resp. rate 17, height 5' 3"  (1.6 m), weight 58.968 kg (130 lb), SpO2 99 %.   REVIEW OF SYSTEMS:  Review of Systems  Constitutional: Negative for fever, chills and malaise/fatigue.  HENT: Negative for sore throat.   Eyes: Negative for blurred vision.  Respiratory: Negative for cough, hemoptysis, shortness of breath and wheezing.   Cardiovascular:  Negative for chest pain, palpitations and leg swelling.  Gastrointestinal: Negative for nausea, vomiting, abdominal pain, diarrhea and blood in stool.  Genitourinary: Negative for dysuria.  Musculoskeletal: Negative for back pain.  Neurological: Negative for dizziness, tremors and headaches.  Endo/Heme/Allergies: Does not bruise/bleed easily.     PHYSICAL EXAMINATION:  GENERAL:  59 y.o.-year-old patient lying in the bed with no acute distress.  NECK:  Supple, no jugular venous distention. No thyroid enlargement, no tenderness.  LUNGS: Normal breath sounds bilaterally, no wheezing, rales,rhonchi  No use of accessory muscles of respiration.  CARDIOVASCULAR: S1, S2 normal. No murmurs, rubs, or gallops.  ABDOMEN: Soft, non-tender, non-distended. Bowel sounds present. No organomegaly or mass.  EXTREMITIES: No pedal edema, cyanosis, or clubbing.  PSYCHIATRIC: The patient is alert  and oriented x 3.  SKIN: No obvious rash, lesion, or ulcer.   DATA REVIEW:   CBC  Recent Labs Lab 04/07/15 0342  WBC 6.2  HGB 11.8*  HCT 35.8  PLT 103*    Chemistries   Recent Labs Lab 04/03/15 0059  04/05/15 1853  04/07/15 0342  NA 137  < >  --   < > 138  K 3.8  < > 3.5  < > 4.0  CL 101  < >  --   < > 108  CO2 26  < >  --   < > 27  GLUCOSE 413*  < >  --   < > 187*  BUN 25*  < >  --   < > 21*  CREATININE 1.03*  < >  --   < > 0.76  CALCIUM 8.9  < >  --   < > 8.6*  MG  --   --  1.2*  --   --   AST 52*  --   --   --   --   ALT 44  --   --   --   --   ALKPHOS 93  --   --   --   --   BILITOT 2.0*  --   --   --   --   < > = values in this interval not displayed.  Cardiac Enzymes  Recent Labs Lab 04/05/15 1059 04/05/15 1853 04/05/15 2325  TROPONINI 0.08* 0.16* 0.15*    Microbiology Results  @MICRORSLT48 @  RADIOLOGY:  Dg Chest Port 1 View  04/05/2015   CLINICAL DATA:  Nausea.  Chest pain  EXAM: PORTABLE CHEST - 1 VIEW  COMPARISON:  None.  FINDINGS: The heart size and mediastinal  contours are within normal limits. Both lungs are clear. The visualized skeletal structures are unremarkable.  IMPRESSION: No active disease.   Electronically Signed   By: Kerby Moors M.D.   On: 04/05/2015 13:36      Management plans discussed with the patient and she is in agreement. Stable for discharge home  Patient should follow up with allergy in one week and PCP in one week GI in 2 weeks  CODE STATUS:     Code Status Orders        Start     Ordered   04/05/15 0916  Full code   Continuous     04/05/15 0916    Advance Directive Documentation        Most Recent Value   Type of Advance Directive  Healthcare Power of Attorney   Pre-existing out of facility DNR order (yellow form or pink MOST form)     "MOST" Form in Place?        TOTAL TIME TAKING CARE OF THIS PATIENT: 35 minutes.    Aline Wesche M.D on 04/07/2015 at 11:44 AM  Between 7am to 6pm - Pager - [phone] After 6pm go to [email or web address] - password [email or web address] [facility]  [facility]  Office  [phone]  CC: Primary care physician; Alvester Chou, NP

## 2015-04-07 NOTE — Telephone Encounter (Signed)
Attempted to contact pt regarding recent hospitalization and f/u appt. No VM on phone, unable to leave message. Will call back

## 2015-04-07 NOTE — Progress Notes (Addendum)
Pt. Discharged to home with [place] and WC via wc. Discharge instructions and medication regimen reviewed at bedside with patient. Pt. verbalizes understanding of instructions and medication regimen & patient aware her prescriptions will be available for pickup at her pharmacy. Signs and symptoms of stroke discussed at length with patient this morning. Patient able to verbalize 3-5 s/s using teach back method. Patient assessment unchanged from this morning. TELE and IV discontinued per policy. Patient refused wound dressing change prior to discharge.

## 2015-04-07 NOTE — Care Management (Signed)
Patient transferred to 2A from ICU and is for discharge home.  She is followed by [facility] and there have been many problems with patient being available for the home visits.  Spoke with Dian Situ from [facility] and relayed that patient says she has been informed by agency of a visit day/time, then no one shows up.  Spoke with agency and patient and it is planned that patient will receive a home visit 7/29 between 2-3.  Patient and agency verbalize understanding.  Have asked agency to give patient a written calendar of projected visits and times.  No visits  Before 1pm.  Discussed that now that patient has  A fib and started on Eliquis she is more at risk without these home visits.  She has also had elevated blood sugars.   Since May 2016, patient [facility].  Discussed the frequent admission with patient and [facility] and the need to prevent readmissions.  Face to Face and home health orders are present.  [facility] informed of discharge today

## 2015-04-07 NOTE — Plan of Care (Signed)
Problem: Phase I Progression Outcomes Goal: Pain controlled with appropriate interventions Outcome: Not Progressing Continues to report abdominal pain.  Requests IV pain meds when offered prn po meds Goal: Tolerates activity with pain controlled Outcome: Progressing Up to bedside commode this shift  Problem: Phase III Progression Outcomes Goal: Pain controlled on oral analgesia Outcome: Not Progressing Prefers IV pain meds when offered prn meds

## 2015-04-07 NOTE — Progress Notes (Addendum)
Patient: [name] / Admit Date: 04/03/2015 / Date of Encounter: 04/07/2015, 11:19 AM  Principal Problem:   Nausea and vomiting Active Problems:   Abdominal pain   Atrial fibrillation, new onset   Epigastric pain   Essential hypertension   Demand ischemia   Diabetes mellitus type I   Protein-calorie malnutrition, severe   Constipation   Hypokalemia   Hyperlipidemia with target LDL less than 100    Subjective: [name] is feeling much better today.  She denies any chest pain or palpitations.  She says her stomach still hurts slightly but feels much better than the last few days.  She has not vomited in 2 days and is sitting up eating breakfast currently.  She is very pleasant this morning and interested in going home whenever she can.  Review of Systems: Review of Systems  Constitutional: Negative for fever, chills, malaise/fatigue and diaphoresis.  HENT: Negative for congestion and ear discharge.   Eyes: Negative for pain and discharge.  Respiratory: Negative for cough, sputum production, shortness of breath and wheezing.   Cardiovascular: Negative for chest pain, palpitations, orthopnea, leg swelling and PND.  Gastrointestinal: Positive for nausea and abdominal pain. Negative for vomiting, diarrhea and constipation.       Much better today than during the rest of her admission  Musculoskeletal: Negative for myalgias, back pain and falls.  Skin: Negative for rash.  Neurological: Negative for dizziness, loss of consciousness, weakness and headaches.  Endo/Heme/Allergies: Does not bruise/bleed easily.  Psychiatric/Behavioral: The patient is not nervous/anxious.   All other systems reviewed and are negative.   Objective: Telemetry: NSR, HR low 90s Physical Exam: Blood pressure 145/58, pulse 97, temperature 98.6 F (37 C), temperature source Oral, resp. rate 24, height 5' 3"  (1.6 m), weight 130 lb (58.968 kg), SpO2 98 %. Body mass index is 23.03 kg/(m^2). General:  Very pleasant, well developed, well nourished, in no acute distress, sitting up in bed watching tv and eating breakfast Head: Normocephalic, atraumatic, sclera non-icteric, no xanthomas, nares are without discharge. Neck: Negative for carotid bruits. JVP not elevated. Lungs: Clear bilaterally to auscultation without wheezes, rales, or rhonchi. Breathing is unlabored. Heart: RRR S1 S2 without murmurs, rubs, or gallops.  Abdomen: Soft, non-tender, slightly distended with normoactive bowel sounds. No rebound/guarding. Extremities: No clubbing or cyanosis. No edema. Distal pedal pulses are 2+ and equal bilaterally. Neuro: Alert and oriented X 3. Moves all extremities spontaneously. Psych:  Responds to questions appropriately with a normal affect.   Intake/Output Summary (Last 24 hours) at 04/07/15 1119 Last data filed at 04/07/15 0830  Gross per 24 hour  Intake    964 ml  Output   1000 ml  Net    -36 ml    Inpatient Medications:  . amiodarone  400 mg Oral BID  . apixaban  5 mg Oral BID  . atorvastatin  40 mg Oral q1800  . citalopram  20 mg Oral Daily  . collagenase   Topical Daily  . dicyclomine  10 mg Oral QID  . diltiazem  120 mg Oral Daily  . docusate sodium  100 mg Oral BID  . feeding supplement (ENSURE ENLIVE)  237 mL Oral BID BM  . insulin aspart  0-5 Units Subcutaneous QHS  . insulin aspart  0-9 Units Subcutaneous TID WC  . insulin glargine  10 Units Subcutaneous QHS  . lisinopril  5 mg Oral Daily  . metoCLOPramide  10 mg Oral TID AC  . metoprolol succinate  50 mg Oral Daily  . morphine  100 mg Oral BID  . pantoprazole (PROTONIX) IV  40 mg Intravenous Q12H  . potassium chloride  40 mEq Oral Once  . senna  1 tablet Oral BID  . sodium chloride  3 mL Intravenous Q12H  . sodium chloride  3 mL Intravenous Q12H  . sodium phosphate  1 enema Rectal Once  . zolpidem  5 mg Oral QHS   Labs:  Recent Labs  04/05/15 1853 04/06/15 0427 04/07/15 0342  NA  --  138 138  K 3.5  3.2* 4.0  CL  --  109 108  CO2  --  25 27  GLUCOSE  --  271* 187*  BUN  --  26* 21*  CREATININE  --  0.90 0.76  CALCIUM  --  7.9* 8.6*  MG 1.2*  --   --     Recent Labs  04/06/15 0427 04/07/15 0342  WBC 8.6 6.2  HGB 10.7* 11.8*  HCT 32.4* 35.8  MCV 97.8 98.4  PLT 104* 103*    Recent Labs  04/05/15 1059 04/05/15 1853 04/05/15 2325  CKTOTAL 46  --   --   TROPONINI 0.08* 0.16* 0.15*   Weights: Filed Weights   04/03/15 0054  Weight: 130 lb (58.968 kg)   Radiology/Studies:  Dg Chest 1 View  03/11/2015   CLINICAL DATA:  Headache with syncope for 4 or 5 days. Slight chest pain and shortness of breath. Initial encounter.  EXAM: CHEST  1 VIEW  COMPARISON:  Radiographs 01/10/2015.  Abdominal CT 10/08/2014.  FINDINGS: 1041 hr. The heart size and mediastinal contours are normal. The lungs are clear. There is no pleural effusion or pneumothorax. No acute osseous findings are identified.  IMPRESSION: No active cardiopulmonary process.   Electronically Signed   By: Richardean Sale M.D.   On: 03/11/2015 10:58   Dg Abd 1 View  04/05/2015   CLINICAL DATA:  Epigastric abdominal pain  EXAM: ABDOMEN - 1 VIEW  COMPARISON:  04/03/2015  FINDINGS: Scattered large and small bowel gas is noted. Fecal material is noted within the colon. Additionally contrast from recent CT examination is seen within the colon. No obstructive changes are noted. No free air is seen. No acute bony abnormality is noted.  IMPRESSION: No acute abnormality noted.   Electronically Signed   By: Inez Catalina M.D.   On: 04/05/2015 09:03   Ct Head Wo Contrast  03/11/2015   CLINICAL DATA:  Diffuse headache for 3 days, more severe on the right than on left. History of prior cervical carcinoma.  EXAM: CT HEAD WITHOUT CONTRAST  TECHNIQUE: Contiguous axial images were obtained from the base of the skull through the vertex without intravenous contrast.  COMPARISON:  Head CT February 15, 2015 and brain MRI February 16, 2015  FINDINGS: The ventricles  and sulci appear within normal limits with respect to size and configuration for age. There is no intracranial mass, hemorrhage, extra-axial fluid collection, or midline shift. Gray-white compartments appear within normal limits. No acute infarct evident. The bony calvarium appears intact. The mastoid air cells are clear.  IMPRESSION: No intracranial mass, hemorrhage, or focal gray - white compartment lesions/acute appearing infarct. No appreciable change compared to recent prior study.   Electronically Signed   By: Lowella Grip III M.D.   On: 03/11/2015 11:07   Ct Abdomen Pelvis W Contrast  04/03/2015   CLINICAL DATA:  Abdominal pain, nausea and vomiting for 3 days, worsening. Initial encounter.  EXAM: CT ABDOMEN AND PELVIS WITH CONTRAST  TECHNIQUE: Multidetector CT imaging of the abdomen and pelvis was performed using the standard protocol following bolus administration of intravenous contrast.  CONTRAST:  100 mL OMNIPAQUE IOHEXOL 300 MG/ML  SOLN  COMPARISON:  CT abdomen and pelvis 10/08/2014 and 07/16/2013.  FINDINGS: Mild dependent atelectasis is seen in the lung bases. No pleural or pericardial effusion.  Nodular border of the liver consistent with cirrhosis is seen and was present on the prior exam. No focal liver lesion is identified. The patient is status post cholecystectomy. The adrenal glands, spleen and pancreas all appear normal. The patient has nonobstructing stones in the lower pole of both the right and left kidney measuring 0.5 cm on the left and 0.7 cm on the right. Scattered aortoiliac atherosclerosis without aneurysm is identified.  The patient is status post hysterectomy. There is a large volume of stool in the ascending colon to the hepatic flexure. The colon is otherwise unremarkable. The stomach and small bowel appear normal. There is no lymphadenopathy or fluid.  No lytic or sclerotic bony lesion is identified. Convex left scoliosis is noted.  IMPRESSION: No acute abnormality.  Large  stool burden ascending colon through the hepatic flexure.  Cirrhosis.  Single bilateral nonobstructing renal stones.  Atherosclerosis.   Electronically Signed   By: Inge Rise M.D.   On: 04/03/2015 07:38   Dg Chest Port 1 View  04/05/2015   CLINICAL DATA:  Nausea.  Chest pain  EXAM: PORTABLE CHEST - 1 VIEW  COMPARISON:  None.  FINDINGS: The heart size and mediastinal contours are within normal limits. Both lungs are clear. The visualized skeletal structures are unremarkable.  IMPRESSION: No active disease.   Electronically Signed   By: Kerby Moors M.D.   On: 04/05/2015 13:36     Assessment and Plan  1. New onset Afib: Currently in NSR since 1:45 PM on 7/26 with HR in the low 90s -Placed on PO Cardizem 120 mg daily and amiodarone 400 mg bid x 1 week, 200 mg bid (start on 8/3) x 1 week, and 200 mg (start on 8/10). -Switch heparin to eliquis 5 bid today (wt less than 60kg but creat/age ok). -We can see back in clinic in 1 wk and then arrange for output cardiolite. -Recent subclinical hypothyroid  -CHADSVASc at least 4, (DM, TIA x 2, female) giving the patient an estimated annual yearly risk of stroke at 4.0%  2. Elevated troponin/chest pain radiating to the back:  Most c/w Demand Ischemia -Flat trending, possibly 2/2 Afib with RVR vs less likely ischemic event/ACS -Recent TTE (02/16/15) showed EF 65%, normal wall motion without RWMA, mild MR, no patent foramen ovale.  -No recurrent c/p in absence of afib.  Plan f/u and outpt mv.   -With h/o DM1, need to be aware of possibility of balanced ischemia. -Cont bb. -No asa in setting of need for eliquis. -Cont statin.  3. Nausea/vomiting/ABD pain: -Previously intractable -Patient underwent an EGD on 7/25 which showed diffuse gastritis. She may have underlying gastroparesis as well.  -Patient will continue on PPI twice a day as well as Reglan. She has tolerated fluids well enough to advance to low residual diet. -Continue to treat symptoms  at this time  4. DM1: -Per IM  5. HLD: -Lipitor  6. Hypokalemia/Hypomagnesemia: -Currently repleted to goal of 4.0 -Continue to monitor daily BMP and replete as needed to 4.0; she does not tolerate po potassium (leads to vomiting, thus this will need to  be repleted via IV) -Mg 1.2 on 7/26, consider repletion and re-checking level - add to blood in lab this AM to determine if she needs further supplementation.  7. HTN:  -taking diltiazem, hydralazine, lisinopril  -hold metoprolol for systolic blood pressure less than 100  -d/c'd hctz on 7/27 -most recent BP 145/58  Murray Hodgkins, NP 04/07/2015, 11:24 AM    Attending Note Patient seen and examined, agree with detailed note above,  Patient presentation and plan discussed on rounds.   Stable heart rhythm, no significant arrhythmia Patient feels better with no nausea vomiting Would continue amiodarone 400 mg twice a day for additional 3 days then down to 200 mg twice a day Would discontinue hep ring, change to eliquis 5 mg twice a day Okay to continue beta blocker and diltiazem at current doses Would recommend close outpatient follow-up  Signed: Esmond Plants  M.D., Ph.D.

## 2015-04-07 NOTE — Care Management Important Message (Signed)
Important Message  Patient Details  Name: [name] MRN: [id number] Date of Birth: September 24, 1955   Medicare Important Message Given:  Yes-second notification given    Darius Bump Allmond 04/07/2015, 9:56 AM

## 2015-04-10 ENCOUNTER — Encounter: Payer: Self-pay | Admitting: Emergency Medicine

## 2015-04-10 ENCOUNTER — Emergency Department
Admission: EM | Admit: 2015-04-10 | Discharge: 2015-04-10 | Disposition: A | Discharge status: Home / Self Care | Payer: Medicare HMO | Attending: Emergency Medicine | Admitting: Emergency Medicine

## 2015-04-10 ENCOUNTER — Other Ambulatory Visit: Payer: Self-pay

## 2015-04-10 DIAGNOSIS — Z7901 Long term (current) use of anticoagulants: Secondary | ICD-10-CM | POA: Insufficient documentation

## 2015-04-10 DIAGNOSIS — Z792 Long term (current) use of antibiotics: Secondary | ICD-10-CM | POA: Insufficient documentation

## 2015-04-10 DIAGNOSIS — R1084 Generalized abdominal pain: Secondary | ICD-10-CM | POA: Insufficient documentation

## 2015-04-10 DIAGNOSIS — R1110 Vomiting, unspecified: Secondary | ICD-10-CM

## 2015-04-10 DIAGNOSIS — I10 Essential (primary) hypertension: Secondary | ICD-10-CM | POA: Insufficient documentation

## 2015-04-10 DIAGNOSIS — R112 Nausea with vomiting, unspecified: Secondary | ICD-10-CM | POA: Diagnosis not present

## 2015-04-10 DIAGNOSIS — G8929 Other chronic pain: Secondary | ICD-10-CM | POA: Insufficient documentation

## 2015-04-10 DIAGNOSIS — Z79899 Other long term (current) drug therapy: Secondary | ICD-10-CM | POA: Insufficient documentation

## 2015-04-10 DIAGNOSIS — Z760 Encounter for issue of repeat prescription: Secondary | ICD-10-CM | POA: Diagnosis present

## 2015-04-10 DIAGNOSIS — E109 Type 1 diabetes mellitus without complications: Secondary | ICD-10-CM | POA: Insufficient documentation

## 2015-04-10 MED ORDER — ONDANSETRON 8 MG PO TBDP: 8.0000 mg | ORAL_TABLET | Freq: Three times a day (TID) | ORAL | Status: DC | PRN

## 2015-04-10 MED ORDER — OXYCODONE-ACETAMINOPHEN 5-325 MG PO TABS
2.0000 | ORAL_TABLET | Freq: Once | ORAL | Status: AC
  Administered 2015-04-10: 2 via ORAL
  Filled 2015-04-10: qty 2

## 2015-04-10 MED ORDER — ONDANSETRON 8 MG PO TBDP
8.0000 mg | ORAL_TABLET | Freq: Once | ORAL | Status: AC
  Administered 2015-04-10: 8 mg via ORAL
  Filled 2015-04-10: qty 1

## 2015-04-10 MED ORDER — HALOPERIDOL LACTATE 5 MG/ML IJ SOLN
5.0000 mg | Freq: Once | INTRAMUSCULAR | Status: AC
  Administered 2015-04-10: 5 mg via INTRAMUSCULAR
  Filled 2015-04-10: qty 1

## 2015-04-10 MED ORDER — LOPERAMIDE HCL 2 MG PO TABS: 4.0000 mg | ORAL_TABLET | Freq: Four times a day (QID) | ORAL | Status: DC | PRN

## 2015-04-10 MED ORDER — CLONIDINE HCL 0.1 MG PO TABS: 0.1000 mg | ORAL_TABLET | Freq: Three times a day (TID) | ORAL | Status: DC | PRN

## 2015-04-10 NOTE — ED Notes (Signed)
Pt family here, pt changing

## 2015-04-10 NOTE — ED Notes (Signed)
Patient presents to Emergency Department via EMS with complaints of needing medication refill.  Pt states she takes morphine and vicodin 5-6 times daily for the last 9 years for DDD and back pain, pt has been out of pain meds for 2 days now and is complaining of N/V/D and chest pain. Pt states she was recently admitted to the hospital for new onset of afib.  Pt unsure of meds that she takes or strengths.

## 2015-04-10 NOTE — ED Provider Notes (Addendum)
[facility] Emergency Department Provider Note  ____________________________________________  Time seen: 1:15 AM on arrival by EMS  I have reviewed the triage vital signs and the nursing notes.   HISTORY  Chief Complaint No chief complaint on file.    HPI [name] is a 59 y.o. female who reports that she takes MS Contin daily and Vicodin when necessary, but has been out of her medicines for 5-6 days because she's not been around to go to her doctor to get her prescriptions refilled. As a result, she is having generalized abdominal pain with nausea and vomiting. She requests pain medicine for her symptoms. No chest pain shortness of breath fever chills dizziness or syncope. No trauma     Past Medical History  Diagnosis Date  . Type 1 diabetes   . Hypothyroid   . Degenerative disk disease   . Stomach ulcer   . Diverticulitis   . Syncope 01/2015  . Anxiety   . GERD (gastroesophageal reflux disease)   . History of hiatal hernia   . Cancer     HX OF CANCER OF UTERUS   . TIA (transient ischemic attack) 02/2015  . Hypertension   . Pancreatitis     Patient Active Problem List   Diagnosis Date Noted  . Nausea and vomiting 04/07/2015  . Demand ischemia 04/06/2015  . Hypokalemia 04/06/2015  . Diabetes mellitus type I 04/06/2015  . Hyperlipidemia with target LDL less than 100 04/06/2015  . Atrial fibrillation, new onset 04/05/2015  . Protein-calorie malnutrition, severe 04/05/2015  . Constipation   . Epigastric pain   . Diabetes type 1, uncontrolled   . Abdominal pain 04/03/2015  . Syncope and collapse 03/11/2015  . CVA (cerebral infarction) 02/15/2015  . Nausea with vomiting 01/12/2015  . Dehydration 01/12/2015  . Essential hypertension 01/12/2015  . Orthostatic hypotension 01/12/2015  . Chronically on opiate therapy 01/12/2015  . Gastroparesis 01/12/2015  . Orthostatic syncope 01/11/2015  . DEPRESSION/ANXIETY 06/27/2007  . MYOFASCIAL  PAIN SYNDROME 06/27/2007  . Chronic pain syndrome 03/28/2007  . DM2 (diabetes mellitus, type 2) 03/27/2007  . GERD 03/27/2007  . DIVERTICULOSIS, COLON 03/27/2007  . LUMBAR DISC DISPLACEMENT 03/27/2007  . PROTEINURIA 03/27/2007  . UTERINE CANCER, HX OF 03/27/2007    Past Surgical History  Procedure Laterality Date  . Hernia repair    . Abdominal hysterectomy    . Cholecystectomy    . Esophagogastroduodenoscopy N/A 04/04/2015    Procedure: ESOPHAGOGASTRODUODENOSCOPY (EGD);  Surgeon: Hulen Luster, MD;  Location[facility] ENDOSCOPY;  Service: Endoscopy;  Laterality: N/A;    Current Outpatient Rx  Name  Route  Sig  Dispense  Refill  . amiodarone (PACERONE) 400 MG tablet   Oral   Take 1 tablet (400 mg total) by mouth 2 (two) times daily.   30 tablet   0     400 mg PO BID for 3 days then 200 mg PO BID for 20 ...   . apixaban (ELIQUIS) 5 MG TABS tablet   Oral   Take 1 tablet (5 mg total) by mouth 2 (two) times daily.   60 tablet   0   . atorvastatin (LIPITOR) 40 MG tablet   Oral   Take 1 tablet (40 mg total) by mouth daily at 6 PM.   30 tablet   0   . citalopram (CELEXA) 20 MG tablet   Oral   Take 1 tablet by mouth daily.         . cloNIDine (  CATAPRES) 0.1 MG tablet   Oral   Take 1 tablet (0.1 mg total) by mouth 3 (three) times daily as needed.   12 tablet   0   . collagenase (SANTYL) ointment   Topical   Apply topically daily.   15 g   0   . dicyclomine (BENTYL) 10 MG capsule   Oral   Take 1 capsule by mouth 4 (four) times daily.         Marland Kitchen diltiazem (CARDIZEM CD) 120 MG 24 hr capsule   Oral   Take 1 capsule (120 mg total) by mouth daily.   30 capsule   0   . HYDROcodone-acetaminophen (NORCO/VICODIN) 5-325 MG per tablet   Oral   Take 1 tablet by mouth every 6 (six) hours as needed for moderate pain.         Marland Kitchen JANUVIA 100 MG tablet   Oral   Take 100 mg by mouth daily.           Dispense as written.   Marland Kitchen lisinopril (PRINIVIL,ZESTRIL) 5 MG tablet    Oral   Take 1 tablet by mouth daily.         Marland Kitchen loperamide (IMODIUM A-D) 2 MG tablet   Oral   Take 2 tablets (4 mg total) by mouth 4 (four) times daily as needed for diarrhea or loose stools.   30 tablet   0   . metFORMIN (GLUCOPHAGE) 1000 MG tablet   Oral   Take 1 tablet by mouth 2 (two) times daily.         . metoCLOPramide (REGLAN) 10 MG tablet   Oral   Take 1 tablet (10 mg total) by mouth 3 (three) times daily before meals.   90 tablet   0   . metoprolol succinate (TOPROL-XL) 50 MG 24 hr tablet   Oral   Take 1 tablet (50 mg total) by mouth daily.   60 tablet   1   . morphine (MS CONTIN) 100 MG 12 hr tablet   Oral   Take 100 mg by mouth 2 (two) times daily.          . ondansetron (ZOFRAN ODT) 8 MG disintegrating tablet   Oral   Take 1 tablet (8 mg total) by mouth every 8 (eight) hours as needed for nausea or vomiting.   20 tablet   0   . pantoprazole (PROTONIX) 40 MG tablet   Oral   Take 1 tablet (40 mg total) by mouth 2 (two) times daily.   60 tablet   0   . tiZANidine (ZANAFLEX) 4 MG tablet   Oral   Take 1 tablet by mouth every 6 (six) hours as needed for muscle spasms.          Marland Kitchen zolpidem (AMBIEN) 5 MG tablet   Oral   Take 1 tablet by mouth at bedtime.           Allergies Erythromycin base; Metformin; Pioglitazone hcl-metformin hcl; Rosiglitazone maleate; Sitagliptin-metformin hcl; Codeine sulfate; and Tetanus-diphtheria toxoids td  Family History  Problem Relation Age of Onset  . Hypertension Mother   . CAD Sister     Social History History  Substance Use Topics  . Smoking status: Never Smoker   . Smokeless tobacco: Never Used  . Alcohol Use: No    Review of Systems  Constitutional: No fever or chills. No weight changes Eyes:No blurry vision or double vision.  ENT: No sore throat. Cardiovascular: No chest pain. Respiratory: No  dyspnea or cough. Gastrointestinal: Generalized abdominal pain and vomiting. No diarrhea.  No BRBPR  or melena. Genitourinary: Negative for dysuria, urinary retention, bloody urine, or difficulty urinating. Musculoskeletal: Negative for back pain. No joint swelling or pain. Skin: Negative for rash. Neurological: Negative for headaches, focal weakness or numbness. Psychiatric:No anxiety or depression.   Endocrine:No hot/cold intolerance, changes in energy, or sleep difficulty.  10-point ROS otherwise negative.  ____________________________________________   PHYSICAL EXAM:  VITAL SIGNS: ED Triage Vitals  Enc Vitals Group     BP --      Pulse --      Resp --      Temp --      Temp src --      SpO2 --      Weight --      Height --      Head Cir --      Peak Flow --      Pain Score --      Pain Loc --      Pain Edu? --      Excl. in [place]? --       Constitutional: Alert and oriented. Well appearing and in no distress. Dry heaving Eyes: No scleral icterus. No conjunctival pallor. PERRL. EOMI ENT   Head: Normocephalic and atraumatic.   Nose: No congestion/rhinnorhea. No septal hematoma   Mouth/Throat: MMM, no pharyngeal erythema. No peritonsillar mass. No uvula shift.   Neck: No stridor. No SubQ emphysema. No meningismus. Hematological/Lymphatic/Immunilogical: No cervical lymphadenopathy. Cardiovascular: Tachycardia heart rate 110. Normal and symmetric distal pulses are present in all extremities. No murmurs, rubs, or gallops. Respiratory: Normal respiratory effort without tachypnea nor retractions. Breath sounds are clear and equal bilaterally. No wheezes/rales/rhonchi. Gastrointestinal: Soft and nontender. No distention. There is no CVA tenderness.  No rebound, rigidity, or guarding. Normoactive bowel sounds Genitourinary: deferred Musculoskeletal: Nontender with normal range of motion in all extremities. No joint effusions.  No lower extremity tenderness.  No edema. Neurologic:   Normal speech and language.  CN 2-10 normal. Motor grossly intact. No pronator  drift.  Normal gait. No gross focal neurologic deficits are appreciated.  Skin:  Skin is warm, dry and intact. No rash noted.  No petechiae, purpura, or bullae. No piloerection   ____________________________________________    LABS (pertinent positives/negatives) (all labs ordered are listed, but only abnormal results are displayed) Labs Reviewed - No data to display ____________________________________________   EKG  Interpreted by me Sinus tachycardia rate 109, normal axis intervals, poor progression in anterior leads, normal ST segments. There is isolated Q-wave and inverted T-wave in lead 3, but no prominent S wave in lead 1  ____________________________________________    RADIOLOGY    ____________________________________________   PROCEDURES  ____________________________________________   INITIAL IMPRESSION / ASSESSMENT AND PLAN / ED COURSE  Pertinent labs & imaging results that were available during my care of the patient were reviewed by me and considered in my medical decision making (see chart for details).  Patient given IV fluids and IV Dilaudid and Zofran upon and initial evaluation for symptom relief. It appears that she is having inadvertent opioid withdrawal due to running out of medications, has no interest in detox from his medications at this time. However, on further review of the records including the [place] controlled substances reporting system, shows that the patient has been very regularly obtaining her medications in the first week of every month, including most recently in 03/15/2015, she filled 1 month prescriptions for MS  Contin 100 mg tabs and Vicodin. It appears the patient has not been truthful, which is highly suspicious since it would serve to obtain additional opioid medications. We will advise the patient that we cannot provide refills or replace her medications for controlled substances for chronic pain, and she'll need to follow up  with her doctor tomorrow. Low suspicion for sepsis ACS PE TAD pneumothorax carditis mediastinitis intracranial hemorrhage or stroke. She is well-appearing no acute distress. We will give her Zofran and clonidine and Imodium for symptoms as needed. Given the presenting complaints and lack of any other acute complaints such as chest pain shortness of breath, and low suspicion that the mild tachycardia represents any acute pathology, is likely related to her chronic pain. Oxygenation and blood pressure are unremarkable, the patient does not have any increased work of breathing or tachypnea.  ____________________________________________   FINAL CLINICAL IMPRESSION(S) / ED DIAGNOSES  Final diagnoses:  Chronic pain  Dry heaves      Carrie Mew, MD 04/10/15 [place], MD 04/10/15 [phone]

## 2015-04-10 NOTE — Discharge Instructions (Signed)
Abdominal Pain Many things can cause abdominal pain. Usually, abdominal pain is not caused by a disease and will improve without treatment. It can often be observed and treated at home. Your health care provider will do a physical exam and possibly order blood tests and X-rays to help determine the seriousness of your pain. However, in many cases, more time must pass before a clear cause of the pain can be found. Before that point, your health care provider may not know if you need more testing or further treatment. HOME CARE INSTRUCTIONS  Monitor your abdominal pain for any changes. The following actions may help to alleviate any discomfort you are experiencing:  Only take over-the-counter or prescription medicines as directed by your health care provider.  Do not take laxatives unless directed to do so by your health care provider.  Try a clear liquid diet (broth, tea, or water) as directed by your health care provider. Slowly move to a bland diet as tolerated. SEEK MEDICAL CARE IF:  You have unexplained abdominal pain.  You have abdominal pain associated with nausea or diarrhea.  You have pain when you urinate or have a bowel movement.  You experience abdominal pain that wakes you in the night.  You have abdominal pain that is worsened or improved by eating food.  You have abdominal pain that is worsened with eating fatty foods.  You have a fever. SEEK IMMEDIATE MEDICAL CARE IF:   Your pain does not go away within 2 hours.  You keep throwing up (vomiting).  Your pain is felt only in portions of the abdomen, such as the right side or the left lower portion of the abdomen.  You pass bloody or black tarry stools. MAKE SURE YOU:  Understand these instructions.   Will watch your condition.   Will get help right away if you are not doing well or get worse.  Document Released: 06/06/2005 Document Revised: 09/01/2013 Document Reviewed: 05/06/2013 [facility] Patient Information  2015 [place], [place]. This information is not intended to replace advice given to you by your health care provider. Make sure you discuss any questions you have with your health care provider.  Chronic Pain Chronic pain can be defined as pain that is off and on and lasts for 3-6 months or longer. Many things cause chronic pain, which can make it difficult to make a diagnosis. There are many treatment options available for chronic pain. However, finding a treatment that works well for you may require trying various approaches until the right one is found. Many people benefit from a combination of two or more types of treatment to control their pain. SYMPTOMS  Chronic pain can occur anywhere in the body and can range from mild to very severe. Some types of chronic pain include:  Headache.  Low back pain.  Cancer pain.  Arthritis pain.  Neurogenic pain. This is pain resulting from damage to nerves. People with chronic pain may also have other symptoms such as:  Depression.  Anger.  Insomnia.  Anxiety. DIAGNOSIS  Your health care provider will help diagnose your condition over time. In many cases, the initial focus will be on excluding possible conditions that could be causing the pain. Depending on your symptoms, your health care provider may order tests to diagnose your condition. Some of these tests may include:   Blood tests.   CT scan.   MRI.   X-rays.   Ultrasounds.   Nerve conduction studies.  You may need to see a specialist.  TREATMENT  Finding treatment that works well may take time. You may be referred to a pain specialist. He or she may prescribe medicine or therapies, such as:   Mindful meditation or yoga.  Shots (injections) of numbing or pain-relieving medicines into the spine or area of pain.  Local electrical stimulation.  Acupuncture.   Massage therapy.   Aroma, color, light, or sound therapy.   Biofeedback.   Working with a physical  therapist to keep from getting stiff.   Regular, gentle exercise.   Cognitive or behavioral therapy.   Group support.  Sometimes, surgery may be recommended.  HOME CARE INSTRUCTIONS   Take all medicines as directed by your health care provider.   Lessen stress in your life by relaxing and doing things such as listening to calming music.   Exercise or be active as directed by your health care provider.   Eat a healthy diet and include things such as vegetables, fruits, fish, and lean meats in your diet.   Keep all follow-up appointments with your health care provider.   Attend a support group with others suffering from chronic pain. SEEK MEDICAL CARE IF:   Your pain gets worse.   You develop a new pain that was not there before.   You cannot tolerate medicines given to you by your health care provider.   You have new symptoms since your last visit with your health care provider.  SEEK IMMEDIATE MEDICAL CARE IF:   You feel weak.   You have decreased sensation or numbness.   You lose control of bowel or bladder function.   Your pain suddenly gets much worse.   You develop shaking.  You develop chills.  You develop confusion.  You develop chest pain.  You develop shortness of breath.  MAKE SURE YOU:  Understand these instructions.  Will watch your condition.  Will get help right away if you are not doing well or get worse. Document Released: 05/19/2002 Document Revised: 04/29/2013 Document Reviewed: 02/20/2013 [facility] Patient Information 2015 [place], [place]. This information is not intended to replace advice given to you by your health care provider. Make sure you discuss any questions you have with your health care provider.  Nausea and Vomiting Nausea means you feel sick to your stomach. Throwing up (vomiting) is a reflex where stomach contents come out of your mouth. HOME CARE   Take medicine as told by your doctor.  Do not force  yourself to eat. However, you do need to drink fluids.  If you feel like eating, eat a normal diet as told by your doctor.  Eat rice, wheat, potatoes, bread, lean meats, yogurt, fruits, and vegetables.  Avoid high-fat foods.  Drink enough fluids to keep your pee (urine) clear or pale yellow.  Ask your doctor how to replace body fluid losses (rehydrate). Signs of body fluid loss (dehydration) include:  Feeling very thirsty.  Dry lips and mouth.  Feeling dizzy.  Dark pee.  Peeing less than normal.  Feeling confused.  Fast breathing or heart rate. GET HELP RIGHT AWAY IF:   You have blood in your throw up.  You have black or bloody poop (stool).  You have a bad headache or stiff neck.  You feel confused.  You have bad belly (abdominal) pain.  You have chest pain or trouble breathing.  You do not pee at least once every 8 hours.  You have cold, clammy skin.  You keep throwing up after 24 to 48 hours.  You  have a fever. MAKE SURE YOU:   Understand these instructions.  Will watch your condition.  Will get help right away if you are not doing well or get worse. Document Released: 02/13/2008 Document Revised: 11/19/2011 Document Reviewed: 01/26/2011 [facility] Patient Information 2015 [place], [place]. This information is not intended to replace advice given to you by your health care provider. Make sure you discuss any questions you have with your health care provider.

## 2015-04-10 NOTE — ED Notes (Signed)
After two attempt to contact pt's house for a ride home [phone] and [phone]) [facility] dispatched to to knock on door of pt's house

## 2015-04-13 ENCOUNTER — Encounter: Payer: Self-pay | Admitting: Occupational Medicine

## 2015-04-13 ENCOUNTER — Emergency Department: Payer: Medicare HMO

## 2015-04-13 ENCOUNTER — Emergency Department
Admission: EM | Admit: 2015-04-13 | Discharge: 2015-04-13 | Discharge status: Against Medical Advice | Payer: Medicare HMO | Attending: Emergency Medicine | Admitting: Emergency Medicine

## 2015-04-13 DIAGNOSIS — S3991XA Unspecified injury of abdomen, initial encounter: Secondary | ICD-10-CM | POA: Diagnosis not present

## 2015-04-13 DIAGNOSIS — Y998 Other external cause status: Secondary | ICD-10-CM | POA: Insufficient documentation

## 2015-04-13 DIAGNOSIS — E109 Type 1 diabetes mellitus without complications: Secondary | ICD-10-CM | POA: Diagnosis not present

## 2015-04-13 DIAGNOSIS — Z79899 Other long term (current) drug therapy: Secondary | ICD-10-CM | POA: Insufficient documentation

## 2015-04-13 DIAGNOSIS — W19XXXA Unspecified fall, initial encounter: Secondary | ICD-10-CM

## 2015-04-13 DIAGNOSIS — Z7901 Long term (current) use of anticoagulants: Secondary | ICD-10-CM | POA: Insufficient documentation

## 2015-04-13 DIAGNOSIS — W01198A Fall on same level from slipping, tripping and stumbling with subsequent striking against other object, initial encounter: Secondary | ICD-10-CM | POA: Insufficient documentation

## 2015-04-13 DIAGNOSIS — Z792 Long term (current) use of antibiotics: Secondary | ICD-10-CM | POA: Insufficient documentation

## 2015-04-13 DIAGNOSIS — Y9389 Activity, other specified: Secondary | ICD-10-CM | POA: Diagnosis not present

## 2015-04-13 DIAGNOSIS — Y92002 Bathroom of unspecified non-institutional (private) residence single-family (private) house as the place of occurrence of the external cause: Secondary | ICD-10-CM | POA: Insufficient documentation

## 2015-04-13 DIAGNOSIS — I10 Essential (primary) hypertension: Secondary | ICD-10-CM | POA: Insufficient documentation

## 2015-04-13 DIAGNOSIS — R55 Syncope and collapse: Secondary | ICD-10-CM | POA: Diagnosis present

## 2015-04-13 DIAGNOSIS — R1013 Epigastric pain: Secondary | ICD-10-CM

## 2015-04-13 DIAGNOSIS — E876 Hypokalemia: Secondary | ICD-10-CM | POA: Insufficient documentation

## 2015-04-13 DIAGNOSIS — W16212A Fall in (into) filled bathtub causing other injury, initial encounter: Secondary | ICD-10-CM | POA: Diagnosis not present

## 2015-04-13 LAB — CBC
HCT: 37.3 % (ref 35.0–47.0)
Hemoglobin: 12.6 g/dL (ref 12.0–16.0)
MCH: 32.8 pg (ref 26.0–34.0)
MCHC: 33.8 g/dL (ref 32.0–36.0)
MCV: 96.9 fL (ref 80.0–100.0)
Platelets: 105 10*3/uL — ABNORMAL LOW (ref 150–440)
RBC: 3.85 MIL/uL (ref 3.80–5.20)
RDW: 15.7 % — AB (ref 11.5–14.5)
WBC: 4.1 10*3/uL (ref 3.6–11.0)

## 2015-04-13 LAB — BASIC METABOLIC PANEL
Anion gap: 8 (ref 5–15)
BUN: 17 mg/dL (ref 6–20)
CHLORIDE: 104 mmol/L (ref 101–111)
CO2: 27 mmol/L (ref 22–32)
Calcium: 8.5 mg/dL — ABNORMAL LOW (ref 8.9–10.3)
Creatinine, Ser: 0.98 mg/dL (ref 0.44–1.00)
GFR calc non Af Amer: >60 mL/min (ref >60)
GLUCOSE: 168 mg/dL — AB (ref 65–99)
POTASSIUM: 2.9 mmol/L — AB (ref 3.5–5.1)
Sodium: 139 mmol/L (ref 135–145)

## 2015-04-13 LAB — TROPONIN I: Troponin I: <0.03 ng/mL (ref <0.031)

## 2015-04-13 MED ORDER — SUCRALFATE 1 G PO TABS: 1.0000 g | ORAL_TABLET | Freq: Four times a day (QID) | ORAL | Status: DC

## 2015-04-13 MED ORDER — GI COCKTAIL ~~LOC~~
30.0000 mL | Freq: Once | ORAL | Status: AC
  Administered 2015-04-13: 30 mL via ORAL
  Filled 2015-04-13: qty 30

## 2015-04-13 MED ORDER — POTASSIUM CHLORIDE 20 MEQ PO PACK
40.0000 meq | PACK | Freq: Once | ORAL | Status: DC
  Filled 2015-04-13: qty 2

## 2015-04-13 MED ORDER — ONDANSETRON HCL 4 MG/2ML IJ SOLN
4.0000 mg | Freq: Once | INTRAMUSCULAR | Status: AC
  Administered 2015-04-13: 4 mg via INTRAVENOUS
  Filled 2015-04-13: qty 2

## 2015-04-13 MED ORDER — HYDROMORPHONE HCL 1 MG/ML IJ SOLN
1.0000 mg | Freq: Once | INTRAMUSCULAR | Status: AC
  Administered 2015-04-13: 1 mg via INTRAVENOUS
  Filled 2015-04-13: qty 1

## 2015-04-13 MED ORDER — POTASSIUM CHLORIDE CRYS ER 20 MEQ PO TBCR
40.0000 meq | EXTENDED_RELEASE_TABLET | Freq: Once | ORAL | Status: DC
  Filled 2015-04-13: qty 2

## 2015-04-13 NOTE — ED Notes (Addendum)
Pt presents from home with +loc after unwitness fall to floor down for 30 sec she states hit shoulder right denies hitting head. Chronic low back pain. Pt has Nausea and diarrhea for 4-5 days. EMS reports she was incont of bladder. BS 183 vital signs low with ems at house then in route came up to 128/78. Pt c/o of abd pain right and left upper quadrant. Pt still reports dizziness.

## 2015-04-13 NOTE — ED Notes (Signed)
Patient has decided to leave AMA.  MD was notified and cleared this decision

## 2015-04-13 NOTE — Discharge Instructions (Signed)
Abdominal Pain Many things can cause abdominal pain. Usually, abdominal pain is not caused by a disease and will improve without treatment. It can often be observed and treated at home. Your health care provider will do a physical exam and possibly order blood tests and X-rays to help determine the seriousness of your pain. However, in many cases, more time must pass before a clear cause of the pain can be found. Before that point, your health care provider may not know if you need more testing or further treatment. HOME CARE INSTRUCTIONS  Monitor your abdominal pain for any changes. The following actions may help to alleviate any discomfort you are experiencing:  Only take over-the-counter or prescription medicines as directed by your health care provider.  Do not take laxatives unless directed to do so by your health care provider.  Try a clear liquid diet (broth, tea, or water) as directed by your health care provider. Slowly move to a bland diet as tolerated. SEEK MEDICAL CARE IF:  You have unexplained abdominal pain.  You have abdominal pain associated with nausea or diarrhea.  You have pain when you urinate or have a bowel movement.  You experience abdominal pain that wakes you in the night.  You have abdominal pain that is worsened or improved by eating food.  You have abdominal pain that is worsened with eating fatty foods.  You have a fever. SEEK IMMEDIATE MEDICAL CARE IF:   Your pain does not go away within 2 hours.  You keep throwing up (vomiting).  Your pain is felt only in portions of the abdomen, such as the right side or the left lower portion of the abdomen.  You pass bloody or black tarry stools. MAKE SURE YOU:  Understand these instructions.   Will watch your condition.   Will get help right away if you are not doing well or get worse.  Document Released: 06/06/2005 Document Revised: 09/01/2013 Document Reviewed: 05/06/2013 [facility] Patient Information  2015 [place], [place]. This information is not intended to replace advice given to you by your health care provider. Make sure you discuss any questions you have with your health care provider.  Fall Prevention and Home Safety Falls cause injuries and can affect all age groups. It is possible to use preventive measures to significantly decrease the likelihood of falls. There are many simple measures which can make your home safer and prevent falls. OUTDOORS  Repair cracks and edges of walkways and driveways.  Remove high doorway thresholds.  Trim shrubbery on the main path into your home.  Have good outside lighting.  Clear walkways of tools, rocks, debris, and clutter.  Check that handrails are not broken and are securely fastened. Both sides of steps should have handrails.  Have leaves, snow, and ice cleared regularly.  Use sand or salt on walkways during winter months.  In the garage, clean up grease or oil spills. BATHROOM  Install night lights.  Install grab bars by the toilet and in the tub and shower.  Use non-skid mats or decals in the tub or shower.  Place a plastic non-slip stool in the shower to sit on, if needed.  Keep floors dry and clean up all water on the floor immediately.  Remove soap buildup in the tub or shower on a regular basis.  Secure bath mats with non-slip, double-sided rug tape.  Remove throw rugs and tripping hazards from the floors. BEDROOMS  Install night lights.  Make sure a bedside light is easy to  reach.  Do not use oversized bedding.  Keep a telephone by your bedside.  Have a firm chair with side arms to use for getting dressed.  Remove throw rugs and tripping hazards from the floor. KITCHEN  Keep handles on pots and pans turned toward the center of the stove. Use back burners when possible.  Clean up spills quickly and allow time for drying.  Avoid walking on wet floors.  Avoid hot utensils and knives.  Position shelves  so they are not too high or low.  Place commonly used objects within easy reach.  If necessary, use a sturdy step stool with a grab bar when reaching.  Keep electrical cables out of the way.  Do not use floor polish or wax that makes floors slippery. If you must use wax, use non-skid floor wax.  Remove throw rugs and tripping hazards from the floor. STAIRWAYS  Never leave objects on stairs.  Place handrails on both sides of stairways and use them. Fix any loose handrails. Make sure handrails on both sides of the stairways are as long as the stairs.  Check carpeting to make sure it is firmly attached along stairs. Make repairs to worn or loose carpet promptly.  Avoid placing throw rugs at the top or bottom of stairways, or properly secure the rug with carpet tape to prevent slippage. Get rid of throw rugs, if possible.  Have an electrician put in a light switch at the top and bottom of the stairs. OTHER FALL PREVENTION TIPS  Wear low-heel or rubber-soled shoes that are supportive and fit well. Wear closed toe shoes.  When using a stepladder, make sure it is fully opened and both spreaders are firmly locked. Do not climb a closed stepladder.  Add color or contrast paint or tape to grab bars and handrails in your home. Place contrasting color strips on first and last steps.  Learn and use mobility aids as needed. Install an electrical emergency response system.  Turn on lights to avoid dark areas. Replace light bulbs that burn out immediately. Get light switches that glow.  Arrange furniture to create clear pathways. Keep furniture in the same place.  Firmly attach carpet with non-skid or double-sided tape.  Eliminate uneven floor surfaces.  Select a carpet pattern that does not visually hide the edge of steps.  Be aware of all pets. OTHER HOME SAFETY TIPS  Set the water temperature for 120 F (48.8 C).  Keep emergency numbers on or near the telephone.  Keep smoke  detectors on every level of the home and near sleeping areas. Document Released: 08/17/2002 Document Revised: 02/26/2012 Document Reviewed: 11/16/2011 [facility] Patient Information 2015 [place], [place]. This information is not intended to replace advice given to you by your health care provider. Make sure you discuss any questions you have with your health care provider.  Hypokalemia Hypokalemia means that the amount of potassium in the blood is lower than normal.Potassium is a chemical, called an electrolyte, that helps regulate the amount of fluid in the body. It also stimulates muscle contraction and helps nerves function properly.Most of the body's potassium is inside of cells, and only a very small amount is in the blood. Because the amount in the blood is so small, minor changes can be life-threatening. CAUSES  Antibiotics.  Diarrhea or vomiting.  Using laxatives too much, which can cause diarrhea.  Chronic kidney disease.  Water pills (diuretics).  Eating disorders (bulimia).  Low magnesium level.  Sweating a lot. SIGNS AND SYMPTOMS  Weakness.  Constipation.  Fatigue.  Muscle cramps.  Mental confusion.  Skipped heartbeats or irregular heartbeat (palpitations).  Tingling or numbness. DIAGNOSIS  Your health care provider can diagnose hypokalemia with blood tests. In addition to checking your potassium level, your health care provider may also check other lab tests. TREATMENT Hypokalemia can be treated with potassium supplements taken by mouth or adjustments in your current medicines. If your potassium level is very low, you may need to get potassium through a vein (IV) and be monitored in the hospital. A diet high in potassium is also helpful. Foods high in potassium are:  Nuts, such as peanuts and pistachios.  Seeds, such as sunflower seeds and pumpkin seeds.  Peas, lentils, and lima beans.  Whole grain and bran cereals and breads.  Fresh fruit and  vegetables, such as apricots, avocado, bananas, cantaloupe, kiwi, oranges, tomatoes, asparagus, and potatoes.  Orange and tomato juices.  Red meats.  Fruit yogurt. HOME CARE INSTRUCTIONS  Take all medicines as prescribed by your health care provider.  Maintain a healthy diet by including nutritious food, such as fruits, vegetables, nuts, whole grains, and lean meats.  If you are taking a laxative, be sure to follow the directions on the label. SEEK MEDICAL CARE IF:  Your weakness gets worse.  You feel your heart pounding or racing.  You are vomiting or having diarrhea.  You are diabetic and having trouble keeping your blood glucose in the normal range. SEEK IMMEDIATE MEDICAL CARE IF:  You have chest pain, shortness of breath, or dizziness.  You are vomiting or having diarrhea for more than 2 days.  You faint. MAKE SURE YOU:   Understand these instructions.  Will watch your condition.  Will get help right away if you are not doing well or get worse. Document Released: 08/27/2005 Document Revised: 06/17/2013 Document Reviewed: 02/27/2013 [facility] Patient Information 2015 [place], [place]. This information is not intended to replace advice given to you by your health care provider. Make sure you discuss any questions you have with your health care provider.

## 2015-04-13 NOTE — ED Notes (Signed)
Patient transported to CT 

## 2015-04-13 NOTE — ED Provider Notes (Signed)
[facility] Emergency Department Provider Note  ____________________________________________  Time seen: Approximately 450 AM  I have reviewed the triage vital signs and the nursing notes.   HISTORY  Chief Complaint Loss of Consciousness    HPI [name] is a 59 y.o. female who comes in because she passed out at home. The patient reports that she was going to the bathroom and lost her balance and fell into the tub. She reports that she hit her head and passed out after falling into the tub. The patient reports that she also has some pain in her abdomen and her back. She reports that when she passed out she was out for less than a minute. The patient reports that she has some upper abdominal pain that she reports started 2 days ago when she's vomited 3-4 times. She denies any chest pain or any shortness of breath and a headache report revision. She reports that her legs are swollen and she has a mild sore throat. The patient reports her pain as a 10 out of 10 in intensity. She is here for treatment and evaluation   Past Medical History  Diagnosis Date  . Type 1 diabetes   . Hypothyroid   . Degenerative disk disease   . Stomach ulcer   . Diverticulitis   . Syncope 01/2015  . Anxiety   . GERD (gastroesophageal reflux disease)   . History of hiatal hernia   . Cancer     HX OF CANCER OF UTERUS   . TIA (transient ischemic attack) 02/2015  . Hypertension   . Pancreatitis     Patient Active Problem List   Diagnosis Date Noted  . Nausea and vomiting 04/07/2015  . Demand ischemia 04/06/2015  . Hypokalemia 04/06/2015  . Diabetes mellitus type I 04/06/2015  . Hyperlipidemia with target LDL less than 100 04/06/2015  . Atrial fibrillation, new onset 04/05/2015  . Protein-calorie malnutrition, severe 04/05/2015  . Constipation   . Epigastric pain   . Diabetes type 1, uncontrolled   . Abdominal pain 04/03/2015  . Syncope and collapse 03/11/2015  . CVA  (cerebral infarction) 02/15/2015  . Nausea with vomiting 01/12/2015  . Dehydration 01/12/2015  . Essential hypertension 01/12/2015  . Orthostatic hypotension 01/12/2015  . Chronically on opiate therapy 01/12/2015  . Gastroparesis 01/12/2015  . Orthostatic syncope 01/11/2015  . DEPRESSION/ANXIETY 06/27/2007  . MYOFASCIAL PAIN SYNDROME 06/27/2007  . Chronic pain syndrome 03/28/2007  . DM2 (diabetes mellitus, type 2) 03/27/2007  . GERD 03/27/2007  . DIVERTICULOSIS, COLON 03/27/2007  . LUMBAR DISC DISPLACEMENT 03/27/2007  . PROTEINURIA 03/27/2007  . UTERINE CANCER, HX OF 03/27/2007    Past Surgical History  Procedure Laterality Date  . Hernia repair    . Abdominal hysterectomy    . Cholecystectomy    . Esophagogastroduodenoscopy N/A 04/04/2015    Procedure: ESOPHAGOGASTRODUODENOSCOPY (EGD);  Surgeon: Hulen Luster, MD;  Location: [facility] ENDOSCOPY;  Service: Endoscopy;  Laterality: N/A;    Current Outpatient Rx  Name  Route  Sig  Dispense  Refill  . citalopram (CELEXA) 20 MG tablet   Oral   Take 1 tablet by mouth daily.         Marland Kitchen dicyclomine (BENTYL) 10 MG capsule   Oral   Take 1 capsule by mouth 4 (four) times daily.         Marland Kitchen HYDROcodone-acetaminophen (NORCO/VICODIN) 5-325 MG per tablet   Oral   Take 1 tablet by mouth every 6 (six) hours as needed  for moderate pain.         Marland Kitchen JANUVIA 100 MG tablet   Oral   Take 100 mg by mouth daily.           Dispense as written.   Marland Kitchen lisinopril (PRINIVIL,ZESTRIL) 5 MG tablet   Oral   Take 1 tablet by mouth daily.         . metFORMIN (GLUCOPHAGE) 1000 MG tablet   Oral   Take 1 tablet by mouth 2 (two) times daily.         . metoprolol succinate (TOPROL-XL) 50 MG 24 hr tablet   Oral   Take 1 tablet (50 mg total) by mouth daily.   60 tablet   1   . morphine (MS CONTIN) 100 MG 12 hr tablet   Oral   Take 100 mg by mouth 2 (two) times daily.          . ondansetron (ZOFRAN ODT) 8 MG disintegrating tablet   Oral   Take  1 tablet (8 mg total) by mouth every 8 (eight) hours as needed for nausea or vomiting.   20 tablet   0   . pantoprazole (PROTONIX) 40 MG tablet   Oral   Take 1 tablet (40 mg total) by mouth 2 (two) times daily.   60 tablet   0   . tiZANidine (ZANAFLEX) 4 MG tablet   Oral   Take 1 tablet by mouth every 6 (six) hours as needed for muscle spasms.          Marland Kitchen zolpidem (AMBIEN) 5 MG tablet   Oral   Take 1 tablet by mouth at bedtime.         Marland Kitchen amiodarone (PACERONE) 400 MG tablet   Oral   Take 1 tablet (400 mg total) by mouth 2 (two) times daily.   30 tablet   0     400 mg PO BID for 3 days then 200 mg PO BID for 20 ...   . apixaban (ELIQUIS) 5 MG TABS tablet   Oral   Take 1 tablet (5 mg total) by mouth 2 (two) times daily.   60 tablet   0   . atorvastatin (LIPITOR) 40 MG tablet   Oral   Take 1 tablet (40 mg total) by mouth daily at 6 PM.   30 tablet   0   . cloNIDine (CATAPRES) 0.1 MG tablet   Oral   Take 1 tablet (0.1 mg total) by mouth 3 (three) times daily as needed.   12 tablet   0   . collagenase (SANTYL) ointment   Topical   Apply topically daily.   15 g   0   . diltiazem (CARDIZEM CD) 120 MG 24 hr capsule   Oral   Take 1 capsule (120 mg total) by mouth daily.   30 capsule   0   . loperamide (IMODIUM A-D) 2 MG tablet   Oral   Take 2 tablets (4 mg total) by mouth 4 (four) times daily as needed for diarrhea or loose stools. Patient not taking: Reported on 04/13/2015   30 tablet   0   . metoCLOPramide (REGLAN) 10 MG tablet   Oral   Take 1 tablet (10 mg total) by mouth 3 (three) times daily before meals.   90 tablet   0     Allergies Erythromycin base; Rosiglitazone maleate; Codeine sulfate; and Tetanus-diphtheria toxoids td  Family History  Problem Relation Age of Onset  . Hypertension  Mother   . CAD Sister     Social History History  Substance Use Topics  . Smoking status: Never Smoker   . Smokeless tobacco: Never Used  . Alcohol  Use: No    Review of Systems Constitutional: No fever/chills Eyes: No visual changes. ENT: No sore throat. Cardiovascular: Denies chest pain. Respiratory: Denies shortness of breath. Gastrointestinal: Abdominal pain vomiting Genitourinary: Negative for dysuria. Musculoskeletal: back pain. Skin: Negative for rash. Neurological: Negative for headaches, focal weakness or numbness.  10-point ROS otherwise negative.  ____________________________________________   PHYSICAL EXAM:  VITAL SIGNS: ED Triage Vitals  Enc Vitals Group     BP 04/13/15 0429 123/63 mmHg     Pulse Rate 04/13/15 0429 60     Resp 04/13/15 0429 16     Temp 04/13/15 0429 98 F (36.7 C)     Temp Source 04/13/15 0429 Oral     SpO2 04/13/15 0429 99 %     Weight 04/13/15 0429 140 lb (63.504 kg)     Height 04/13/15 0429 5' 4"  (1.626 m)     Head Cir --      Peak Flow --      Pain Score 04/13/15 0430 10     Pain Loc --      Pain Edu? --      Excl. in [place]? --     Constitutional: Alert and oriented. Well appearing and in moderate distress. Eyes: Conjunctivae are normal. PERRL. EOMI. Head: Atraumatic. Nose: No congestion/rhinnorhea. Mouth/Throat: Mucous membranes are moist.  Oropharynx non-erythematous. Neck: No cervical spine tenderness to palpation. Cardiovascular: Normal rate, regular rhythm. Grossly normal heart sounds.  Good peripheral circulation. Respiratory: Normal respiratory effort.  No retractions. Lungs CTAB. Gastrointestinal: Soft and nontender. No distention. Positive bowel sounds Genitourinary: Deferred Musculoskeletal: No lower extremity tenderness nor edema.  No joint effusions. Neurologic:  Normal speech and language. No gross focal neurologic deficits are appreciated.  Skin:  Skin is warm, dry and intact. No rash noted. Psychiatric: Mood and affect are normal.   ____________________________________________   LABS (all labs ordered are listed, but only abnormal results are  displayed)  Labs Reviewed  BASIC METABOLIC PANEL - Abnormal; Notable for the following:    Potassium 2.9 (*)    Glucose, Bld 168 (*)    Calcium 8.5 (*)    All other components within normal limits  CBC - Abnormal; Notable for the following:    RDW 15.7 (*)    Platelets 105 (*)    All other components within normal limits  URINALYSIS COMPLETEWITH MICROSCOPIC ([facility] ONLY)  TROPONIN I  CBG MONITORING, ED   ____________________________________________  EKG  ED ECG REPORT I, Loney Hering, the attending physician, personally viewed and interpreted this ECG.   Date: 04/13/2015  EKG Time: 424  Rate: 58  Rhythm: sinus bradycardia  Axis: Normal  Intervals:none  ST&T Change: Flipped T waves in lead 3, aVF, V6, V5  ____________________________________________  RADIOLOGY  CT head: Mild atrophy and minimal microvascular changes, No acute findings ____________________________________________   PROCEDURES  Procedure(s) performed: None  Critical Care performed: No  ____________________________________________   INITIAL IMPRESSION / ASSESSMENT AND PLAN / ED COURSE  Pertinent labs & imaging results that were available during my care of the patient were reviewed by me and considered in my medical decision making (see chart for details).  This is a 59 year old female who comes in today after losing her balance and falling at home. The patient's CT scan is unremarkable. The  patient was complaining of some abdominal pain in her upper abdomen but did receive a CT scan that was unremarkable on July 24 and also had an upper endoscopy that showed diffuse gastritis. I gave the patient a dose of Dilaudid initially and then offered her a GI cocktail. I informed the patient that given her previous visits here on the 24th as well as on the 31st I felt a lot of was not the best medication for her to receive. The patient became upset and reported that if he were not going to treat her that  she would leave the hospital. The patient also refused to take her potassium. I did inform the patient that we did not have a urinalysis to completely evaluate her for her fall today. The patient reports that she would take the risk and wanted to go home at this point and she has pain medicine that she can take at home. The patient will be signed out [place]. She understands the risks she is taking at being discharged [place]. She reports that she will follow up with her primary care physician or go to another hospital if need be. ____________________________________________   FINAL CLINICAL IMPRESSION(S) / ED DIAGNOSES  Final diagnoses:  Fall Abdominal Pain Loss of Conciousness      Loney Hering, MD 04/13/15 [phone]

## 2015-05-02 ENCOUNTER — Encounter: Payer: Self-pay | Admitting: *Deleted

## 2015-05-02 ENCOUNTER — Encounter: Payer: Self-pay | Admitting: Physician Assistant

## 2015-05-02 ENCOUNTER — Encounter: Payer: Medicare HMO | Admitting: Physician Assistant

## 2015-05-12 DIAGNOSIS — K561 Intussusception: Secondary | ICD-10-CM

## 2015-05-12 HISTORY — DX: Intussusception: K56.1

## 2015-05-17 ENCOUNTER — Emergency Department: Payer: Commercial Managed Care - HMO

## 2015-05-17 ENCOUNTER — Emergency Department
Admission: EM | Admit: 2015-05-17 | Discharge: 2015-05-17 | Disposition: A | Discharge status: Home / Self Care | Payer: Commercial Managed Care - HMO | Attending: Emergency Medicine | Admitting: Emergency Medicine

## 2015-05-17 ENCOUNTER — Encounter: Payer: Self-pay | Admitting: *Deleted

## 2015-05-17 DIAGNOSIS — K529 Noninfective gastroenteritis and colitis, unspecified: Secondary | ICD-10-CM

## 2015-05-17 DIAGNOSIS — I10 Essential (primary) hypertension: Secondary | ICD-10-CM | POA: Insufficient documentation

## 2015-05-17 DIAGNOSIS — R079 Chest pain, unspecified: Secondary | ICD-10-CM | POA: Diagnosis not present

## 2015-05-17 DIAGNOSIS — E109 Type 1 diabetes mellitus without complications: Secondary | ICD-10-CM | POA: Diagnosis not present

## 2015-05-17 DIAGNOSIS — R1084 Generalized abdominal pain: Secondary | ICD-10-CM | POA: Diagnosis not present

## 2015-05-17 DIAGNOSIS — Z79899 Other long term (current) drug therapy: Secondary | ICD-10-CM | POA: Insufficient documentation

## 2015-05-17 DIAGNOSIS — R001 Bradycardia, unspecified: Secondary | ICD-10-CM | POA: Insufficient documentation

## 2015-05-17 DIAGNOSIS — Z79811 Long term (current) use of aromatase inhibitors: Secondary | ICD-10-CM | POA: Insufficient documentation

## 2015-05-17 DIAGNOSIS — R0789 Other chest pain: Secondary | ICD-10-CM | POA: Diagnosis not present

## 2015-05-17 DIAGNOSIS — R197 Diarrhea, unspecified: Secondary | ICD-10-CM | POA: Diagnosis not present

## 2015-05-17 DIAGNOSIS — Z7902 Long term (current) use of antithrombotics/antiplatelets: Secondary | ICD-10-CM | POA: Diagnosis not present

## 2015-05-17 DIAGNOSIS — R112 Nausea with vomiting, unspecified: Secondary | ICD-10-CM | POA: Diagnosis not present

## 2015-05-17 DIAGNOSIS — R1013 Epigastric pain: Secondary | ICD-10-CM | POA: Diagnosis not present

## 2015-05-17 DIAGNOSIS — Z79891 Long term (current) use of opiate analgesic: Secondary | ICD-10-CM | POA: Diagnosis not present

## 2015-05-17 LAB — URINALYSIS COMPLETE WITH MICROSCOPIC (ARMC ONLY)
BILIRUBIN URINE: NEGATIVE
Bacteria, UA: NONE SEEN
Glucose, UA: >500 mg/dL — AB
KETONES UR: NEGATIVE mg/dL
LEUKOCYTES UA: NEGATIVE
NITRITE: NEGATIVE
PH: 6 (ref 5.0–8.0)
PROTEIN: NEGATIVE mg/dL
SPECIFIC GRAVITY, URINE: 1.01 (ref 1.005–1.030)
Squamous Epithelial / LPF: NONE SEEN

## 2015-05-17 LAB — CBC
HCT: 36.1 % (ref 35.0–47.0)
HEMOGLOBIN: 12.1 g/dL (ref 12.0–16.0)
MCH: 33.2 pg (ref 26.0–34.0)
MCHC: 33.7 g/dL (ref 32.0–36.0)
MCV: 98.7 fL (ref 80.0–100.0)
Platelets: 104 10*3/uL — ABNORMAL LOW (ref 150–440)
RBC: 3.65 MIL/uL — AB (ref 3.80–5.20)
RDW: 13.7 % (ref 11.5–14.5)
WBC: 5.6 10*3/uL (ref 3.6–11.0)

## 2015-05-17 LAB — HEPATIC FUNCTION PANEL
ALBUMIN: 2.9 g/dL — AB (ref 3.5–5.0)
ALK PHOS: 71 U/L (ref 38–126)
ALT: 24 U/L (ref 14–54)
AST: 34 U/L (ref 15–41)
BILIRUBIN INDIRECT: 0.8 mg/dL (ref 0.3–0.9)
Bilirubin, Direct: 0.3 mg/dL (ref 0.1–0.5)
TOTAL PROTEIN: 5.5 g/dL — AB (ref 6.5–8.1)
Total Bilirubin: 1.1 mg/dL (ref 0.3–1.2)

## 2015-05-17 LAB — BASIC METABOLIC PANEL
ANION GAP: 5 (ref 5–15)
BUN: 19 mg/dL (ref 6–20)
CALCIUM: 7.8 mg/dL — AB (ref 8.9–10.3)
CO2: 22 mmol/L (ref 22–32)
Chloride: 101 mmol/L (ref 101–111)
Creatinine, Ser: 0.96 mg/dL (ref 0.44–1.00)
GFR calc non Af Amer: >60 mL/min (ref >60)
Glucose, Bld: 309 mg/dL — ABNORMAL HIGH (ref 65–99)
Potassium: 3.2 mmol/L — ABNORMAL LOW (ref 3.5–5.1)
SODIUM: 128 mmol/L — AB (ref 135–145)

## 2015-05-17 LAB — TROPONIN I

## 2015-05-17 LAB — PROTIME-INR
INR: 1.32
PROTHROMBIN TIME: 16.6 s — AB (ref 11.4–15.0)

## 2015-05-17 LAB — LIPASE, BLOOD: LIPASE: 48 U/L (ref 22–51)

## 2015-05-17 MED ORDER — SODIUM CHLORIDE 0.9 % IV BOLUS (SEPSIS)
1000.0000 mL | Freq: Once | INTRAVENOUS | Status: AC
  Administered 2015-05-17: 1000 mL via INTRAVENOUS

## 2015-05-17 MED ORDER — METOCLOPRAMIDE HCL 10 MG PO TABS: 10.0000 mg | ORAL_TABLET | Freq: Three times a day (TID) | ORAL | Status: DC

## 2015-05-17 MED ORDER — METOCLOPRAMIDE HCL 5 MG/ML IJ SOLN
10.0000 mg | Freq: Once | INTRAMUSCULAR | Status: AC
  Administered 2015-05-17: 10 mg via INTRAVENOUS
  Filled 2015-05-17: qty 2

## 2015-05-17 MED ORDER — GI COCKTAIL ~~LOC~~
30.0000 mL | ORAL | Status: AC
  Administered 2015-05-17: 30 mL via ORAL
  Filled 2015-05-17: qty 30

## 2015-05-17 MED ORDER — DIPHENHYDRAMINE HCL 25 MG PO CAPS: 50.0000 mg | ORAL_CAPSULE | Freq: Four times a day (QID) | ORAL | Status: DC | PRN

## 2015-05-17 MED ORDER — METRONIDAZOLE 500 MG PO TABS: 500.0000 mg | ORAL_TABLET | Freq: Three times a day (TID) | ORAL | Status: DC

## 2015-05-17 MED ORDER — HYDROMORPHONE HCL 1 MG/ML IJ SOLN
1.0000 mg | Freq: Once | INTRAMUSCULAR | Status: AC
Start: 2015-05-17 — End: 2015-05-17
  Administered 2015-05-17: 1 mg via INTRAVENOUS
  Filled 2015-05-17: qty 1

## 2015-05-17 MED ORDER — IOHEXOL 240 MG/ML SOLN
25.0000 mL | Freq: Once | INTRAMUSCULAR | Status: AC | PRN
  Administered 2015-05-17: 25 mL via ORAL

## 2015-05-17 MED ORDER — IOHEXOL 300 MG/ML  SOLN
100.0000 mL | Freq: Once | INTRAMUSCULAR | Status: AC | PRN
  Administered 2015-05-17: 100 mL via INTRAVENOUS

## 2015-05-17 MED ORDER — CIPROFLOXACIN HCL 500 MG PO TABS: 500.0000 mg | ORAL_TABLET | Freq: Two times a day (BID) | ORAL | Status: DC

## 2015-05-17 MED ORDER — DIPHENHYDRAMINE HCL 50 MG/ML IJ SOLN
25.0000 mg | Freq: Once | INTRAMUSCULAR | Status: AC
Start: 2015-05-17 — End: 2015-05-17
  Administered 2015-05-17: 25 mg via INTRAVENOUS
  Filled 2015-05-17: qty 1

## 2015-05-17 MED ORDER — FAMOTIDINE 20 MG PO TABS
40.0000 mg | ORAL_TABLET | Freq: Once | ORAL | Status: AC
  Administered 2015-05-17: 40 mg via ORAL
  Filled 2015-05-17: qty 2

## 2015-05-17 NOTE — ED Provider Notes (Signed)
[facility] Emergency Department [name] Note  ____________________________________________  Time seen: 5:25 PM  I have reviewed the triage vital signs and the nursing notes.   HISTORY  Chief Complaint Chest Pain    HPI [name] is a 59 y.o. female who complains of epigastric pain over the last week, worse over last 3 days. She has nausea and vomiting and diarrhea with this. She also states that the epigastric pain appears to radiate up into the chest. It is not exertional nor pleuritic. No shortness of breath or diaphoresis. No fever. She is on multiple medications for chronic recurrent nausea vomiting and diarrhea, which she reports have not been helping.Epigastric pain is severe, nonradiating. Patient has not been able to eat or drink much over last several days due to the pain and loss of appetite.     Past Medical History  Diagnosis Date  . Type 1 diabetes   . Hypothyroid   . Degenerative disk disease   . Stomach ulcer   . Diverticulitis   . Syncope 01/2015  . Anxiety   . GERD (gastroesophageal reflux disease)   . History of hiatal hernia   . Cancer     HX OF CANCER OF UTERUS   . TIA (transient ischemic attack) 02/2015  . Hypertension   . Pancreatitis   . PAF (paroxysmal atrial fibrillation)     a. new onset 03/2015 in setting of intractable N/V; b. on Eliquis 5 mg bid; c. CHADSVASc at least 4 (DM, TIA x 2, female)     Patient Active Problem List   Diagnosis Date Noted  . PAF (paroxysmal atrial fibrillation)   . Nausea and vomiting 04/07/2015  . Demand ischemia 04/06/2015  . Hypokalemia 04/06/2015  . Diabetes mellitus type I 04/06/2015  . Hyperlipidemia with target LDL less than 100 04/06/2015  . Atrial fibrillation, new onset 04/05/2015  . Protein-calorie malnutrition, severe 04/05/2015  . Constipation   . Epigastric pain   . Diabetes type 1, uncontrolled   . Abdominal pain 04/03/2015  . Syncope and collapse 03/11/2015  . CVA  (cerebral infarction) 02/15/2015  . Nausea with vomiting 01/12/2015  . Dehydration 01/12/2015  . Essential hypertension 01/12/2015  . Orthostatic hypotension 01/12/2015  . Chronically on opiate therapy 01/12/2015  . Gastroparesis 01/12/2015  . Orthostatic syncope 01/11/2015  . DEPRESSION/ANXIETY 06/27/2007  . MYOFASCIAL PAIN SYNDROME 06/27/2007  . Chronic pain syndrome 03/28/2007  . DM2 (diabetes mellitus, type 2) 03/27/2007  . GERD 03/27/2007  . DIVERTICULOSIS, COLON 03/27/2007  . LUMBAR DISC DISPLACEMENT 03/27/2007  . PROTEINURIA 03/27/2007  . UTERINE CANCER, HX OF 03/27/2007     Past Surgical History  Procedure Laterality Date  . Hernia repair    . Abdominal hysterectomy    . Cholecystectomy    . Esophagogastroduodenoscopy N/A 04/04/2015    Procedure: ESOPHAGOGASTRODUODENOSCOPY (EGD);  Surgeon: Hulen Luster, MD;  Location: [facility] ENDOSCOPY;  Service: Endoscopy;  Laterality: N/A;     Current Outpatient Rx  Name  Route  Sig  Dispense  Refill  . amiodarone (PACERONE) 400 MG tablet   Oral   Take 1 tablet (400 mg total) by mouth 2 (two) times daily.   30 tablet   0     400 mg PO BID for 3 days then 200 mg PO BID for 20 ...   . apixaban (ELIQUIS) 5 MG TABS tablet   Oral   Take 1 tablet (5 mg total) by mouth 2 (two) times daily.   60 tablet  0   . atorvastatin (LIPITOR) 40 MG tablet   Oral   Take 1 tablet (40 mg total) by mouth daily at 6 PM.   30 tablet   0   . ciprofloxacin (CIPRO) 500 MG tablet   Oral   Take 1 tablet (500 mg total) by mouth 2 (two) times daily.   14 tablet   0   . citalopram (CELEXA) 20 MG tablet   Oral   Take 1 tablet by mouth daily.         . cloNIDine (CATAPRES) 0.1 MG tablet   Oral   Take 1 tablet (0.1 mg total) by mouth 3 (three) times daily as needed.   12 tablet   0   . collagenase (SANTYL) ointment   Topical   Apply topically daily.   15 g   0   . dicyclomine (BENTYL) 10 MG capsule   Oral   Take 1 capsule by mouth 4  (four) times daily.         Marland Kitchen diltiazem (CARDIZEM CD) 120 MG 24 hr capsule   Oral   Take 1 capsule (120 mg total) by mouth daily.   30 capsule   0   . diphenhydrAMINE (BENADRYL) 25 mg capsule   Oral   Take 2 capsules (50 mg total) by mouth every 6 (six) hours as needed.   60 capsule   0   . HYDROcodone-acetaminophen (NORCO/VICODIN) 5-325 MG per tablet   Oral   Take 1 tablet by mouth every 6 (six) hours as needed for moderate pain.         Marland Kitchen JANUVIA 100 MG tablet   Oral   Take 100 mg by mouth daily.           Dispense as written.   Marland Kitchen lisinopril (PRINIVIL,ZESTRIL) 5 MG tablet   Oral   Take 1 tablet by mouth daily.         Marland Kitchen loperamide (IMODIUM A-D) 2 MG tablet   Oral   Take 2 tablets (4 mg total) by mouth 4 (four) times daily as needed for diarrhea or loose stools. Patient not taking: Reported on 04/13/2015   30 tablet   0   . metFORMIN (GLUCOPHAGE) 1000 MG tablet   Oral   Take 1 tablet by mouth 2 (two) times daily.         . metoCLOPramide (REGLAN) 10 MG tablet   Oral   Take 1 tablet (10 mg total) by mouth 4 (four) times daily -  before meals and at bedtime.   60 tablet   0   . metoprolol succinate (TOPROL-XL) 50 MG 24 hr tablet   Oral   Take 1 tablet (50 mg total) by mouth daily.   60 tablet   1   . metroNIDAZOLE (FLAGYL) 500 MG tablet   Oral   Take 1 tablet (500 mg total) by mouth 3 (three) times daily.   30 tablet   0   . morphine (MS CONTIN) 100 MG 12 hr tablet   Oral   Take 100 mg by mouth 2 (two) times daily.          . ondansetron (ZOFRAN ODT) 8 MG disintegrating tablet   Oral   Take 1 tablet (8 mg total) by mouth every 8 (eight) hours as needed for nausea or vomiting.   20 tablet   0   . pantoprazole (PROTONIX) 40 MG tablet   Oral   Take 1 tablet (40 mg total) by mouth  2 (two) times daily.   60 tablet   0   . sucralfate (CARAFATE) 1 G tablet   Oral   Take 1 tablet (1 g total) by mouth 4 (four) times daily.   30 tablet   0    . tiZANidine (ZANAFLEX) 4 MG tablet   Oral   Take 1 tablet by mouth every 6 (six) hours as needed for muscle spasms.          Marland Kitchen zolpidem (AMBIEN) 5 MG tablet   Oral   Take 1 tablet by mouth at bedtime.            Allergies Erythromycin base; Rosiglitazone maleate; Codeine sulfate; and Tetanus-diphtheria toxoids td   Family History  Problem Relation Age of Onset  . Hypertension Mother   . CAD Sister     Social History Social History  Substance Use Topics  . Smoking status: Never Smoker   . Smokeless tobacco: Never Used  . Alcohol Use: No    Review of Systems  Constitutional:   No fever or chills. No weight changes Eyes:   No blurry vision or double vision.  ENT:   No sore throat. Cardiovascular:   Chest pain as above Respiratory:   No dyspnea or cough. Gastrointestinal:   Epigastric pain as above with vomiting and diarrhea.  No BRBPR or melena. Genitourinary:   Negative for dysuria, urinary retention, bloody urine, or difficulty urinating. Musculoskeletal:   Negative for back pain. No joint swelling or pain. Skin:   Negative for rash. Neurological:   Negative for headaches, focal weakness or numbness. Psychiatric:  No anxiety or depression.   Endocrine:  No hot/cold intolerance, changes in energy, or sleep difficulty.  10-point ROS otherwise negative.  ____________________________________________   PHYSICAL EXAM:  VITAL SIGNS: ED Triage Vitals  Enc Vitals Group     BP 05/17/15 1724 143/73 mmHg     Pulse Rate 05/17/15 1724 50     Resp 05/17/15 1724 16     Temp 05/17/15 1724 98.3 F (36.8 C)     Temp Source 05/17/15 1724 Oral     SpO2 05/17/15 1724 100 %     Weight 05/17/15 1724 135 lb (61.236 kg)     Height 05/17/15 1724 5' 3"  (1.6 m)     Head Cir --      Peak Flow --      Pain Score 05/17/15 1725 10     Pain Loc --      Pain Edu? --      Excl. in [place]? --      Constitutional:   Alert and oriented. Moderate distress due to pain and  nausea Eyes:   No scleral icterus. No conjunctival pallor. PERRL. EOMI ENT   Head:   Normocephalic and atraumatic.   Nose:   No congestion/rhinnorhea. No septal hematoma   Mouth/Throat:   Dry mucous membranes, no pharyngeal erythema. No peritonsillar mass. No uvula shift.   Neck:   No stridor. No SubQ emphysema. No meningismus. Hematological/Lymphatic/Immunilogical:   No cervical lymphadenopathy. Cardiovascular:   Bradycardia heart rate 50. Normal and symmetric distal pulses are present in all extremities. No murmurs, rubs, or gallops. Respiratory:   Normal respiratory effort without tachypnea nor retractions. Breath sounds are clear and equal bilaterally. No wheezes/rales/rhonchi. Gastrointestinal:   Soft with epigastric, right lower quadrant, suprapubic tenderness. No distention. There is mild left CVA tenderness.  No rebound, rigidity, or guarding. Genitourinary:   deferred Musculoskeletal:   Nontender with normal range  of motion in all extremities. No joint effusions.  No lower extremity tenderness.  No edema. Neurologic:   Normal speech and language.  CN 2-10 normal. Motor grossly intact. No pronator drift.  Normal gait. No gross focal neurologic deficits are appreciated.  Skin:    Skin is warm, dry and intact. No rash noted.  No petechiae, purpura, or bullae. Psychiatric:   Mood and affect are normal. Speech and behavior are normal. Patient exhibits appropriate insight and judgment.  ____________________________________________    LABS (pertinent positives/negatives) (all labs ordered are listed, but only abnormal results are displayed) Labs Reviewed  BASIC METABOLIC PANEL - Abnormal; Notable for the following:    Sodium 128 (*)    Potassium 3.2 (*)    Glucose, Bld 309 (*)    Calcium 7.8 (*)    All other components within normal limits  CBC - Abnormal; Notable for the following:    RBC 3.65 (*)    Platelets 104 (*)    All other components within normal limits   PROTIME-INR - Abnormal; Notable for the following:    Prothrombin Time 16.6 (*)    All other components within normal limits  HEPATIC FUNCTION PANEL - Abnormal; Notable for the following:    Total Protein 5.5 (*)    Albumin 2.9 (*)    All other components within normal limits  URINALYSIS COMPLETEWITH MICROSCOPIC ([facility] ONLY) - Abnormal; Notable for the following:    Color, Urine STRAW (*)    APPearance CLEAR (*)    Glucose, UA >500 (*)    Hgb urine dipstick 3+ (*)    All other components within normal limits  TROPONIN I  LIPASE, BLOOD   ____________________________________________   EKG  Interpreted by me Sinus bradycardia rate of 53, normal axis intervals QRS ST segments and T waves  ____________________________________________    RADIOLOGY  Chest x-ray unremarkable CT reveals multiple inflamed loops of jejunum with nonobstructing intussusception  ____________________________________________   PROCEDURES   ____________________________________________   INITIAL IMPRESSION / ASSESSMENT AND PLAN / ED COURSE  Pertinent labs & imaging results that were available during my care of the patient were reviewed by me and considered in my medical decision making (see chart for details).  Patient presents with epigastric pain. We'll check labs urinalysis and chest x-ray, likely proceed to CT abdomen and pelvis. GI cocktail Pepcid and Reglan and Benadryl for symptom relief at this time. We'll also give IV Dilaudid for pain.  ----------------------------------------- 9:05 PM on 05/17/2015 -----------------------------------------  Patient is medically stable, symptoms controlled in the ED. CT reveals jejunitis which I reviewed with surgery, nothing to do at this time. We'll start on Cipro Flagyl and have her follow-up in clinic.   ____________________________________________   FINAL CLINICAL IMPRESSION(S) / ED DIAGNOSES  Final diagnoses:  Epigastric pain  Jejunitis       Carrie Mew, MD 05/17/15 2105

## 2015-05-17 NOTE — ED Notes (Signed)
During triage assessment, the pt reported that she is on both Coumadin 2 mg daily at home and eliquis since her admission at the end of July. Dr. Joni Fears alerted to the same

## 2015-05-17 NOTE — Discharge Instructions (Signed)

## 2015-05-17 NOTE — ED Notes (Signed)
Pt arrives via EMS from home. Pt with c/o cp, epigastric pain, n/v x 3 days, symptoms worse today. Initial BP 90/40, 500cc bolus, 4 zofran, 324 aspirin, , now 130/90, hr 40-44. Pt with recent dx with afib.

## 2015-05-18 ENCOUNTER — Emergency Department (HOSPITAL_COMMUNITY): Payer: Medicare HMO

## 2015-05-18 ENCOUNTER — Inpatient Hospital Stay (HOSPITAL_COMMUNITY)
Admission: EM | Admit: 2015-05-18 | Discharge: 2015-05-22 | DRG: 312 | Disposition: A | Discharge status: Home / Self Care | Payer: Medicare HMO | Attending: Internal Medicine | Admitting: Internal Medicine

## 2015-05-18 ENCOUNTER — Encounter (HOSPITAL_COMMUNITY): Payer: Self-pay | Admitting: *Deleted

## 2015-05-18 DIAGNOSIS — I480 Paroxysmal atrial fibrillation: Secondary | ICD-10-CM | POA: Diagnosis present

## 2015-05-18 DIAGNOSIS — E860 Dehydration: Secondary | ICD-10-CM | POA: Diagnosis present

## 2015-05-18 DIAGNOSIS — K561 Intussusception: Secondary | ICD-10-CM | POA: Diagnosis present

## 2015-05-18 DIAGNOSIS — R1013 Epigastric pain: Secondary | ICD-10-CM

## 2015-05-18 DIAGNOSIS — Z8673 Personal history of transient ischemic attack (TIA), and cerebral infarction without residual deficits: Secondary | ICD-10-CM | POA: Diagnosis not present

## 2015-05-18 DIAGNOSIS — Z23 Encounter for immunization: Secondary | ICD-10-CM | POA: Diagnosis not present

## 2015-05-18 DIAGNOSIS — R51 Headache: Secondary | ICD-10-CM | POA: Diagnosis not present

## 2015-05-18 DIAGNOSIS — G894 Chronic pain syndrome: Secondary | ICD-10-CM | POA: Diagnosis present

## 2015-05-18 DIAGNOSIS — R404 Transient alteration of awareness: Secondary | ICD-10-CM | POA: Diagnosis not present

## 2015-05-18 DIAGNOSIS — E039 Hypothyroidism, unspecified: Secondary | ICD-10-CM | POA: Diagnosis present

## 2015-05-18 DIAGNOSIS — R9431 Abnormal electrocardiogram [ECG] [EKG]: Secondary | ICD-10-CM | POA: Diagnosis present

## 2015-05-18 DIAGNOSIS — M419 Scoliosis, unspecified: Secondary | ICD-10-CM | POA: Diagnosis present

## 2015-05-18 DIAGNOSIS — K529 Noninfective gastroenteritis and colitis, unspecified: Secondary | ICD-10-CM | POA: Diagnosis present

## 2015-05-18 DIAGNOSIS — I9589 Other hypotension: Secondary | ICD-10-CM | POA: Diagnosis present

## 2015-05-18 DIAGNOSIS — G459 Transient cerebral ischemic attack, unspecified: Secondary | ICD-10-CM

## 2015-05-18 DIAGNOSIS — F1120 Opioid dependence, uncomplicated: Secondary | ICD-10-CM | POA: Diagnosis present

## 2015-05-18 DIAGNOSIS — G8929 Other chronic pain: Secondary | ICD-10-CM | POA: Diagnosis not present

## 2015-05-18 DIAGNOSIS — E1165 Type 2 diabetes mellitus with hyperglycemia: Secondary | ICD-10-CM | POA: Diagnosis present

## 2015-05-18 DIAGNOSIS — R112 Nausea with vomiting, unspecified: Secondary | ICD-10-CM | POA: Diagnosis not present

## 2015-05-18 DIAGNOSIS — R109 Unspecified abdominal pain: Secondary | ICD-10-CM | POA: Diagnosis present

## 2015-05-18 DIAGNOSIS — Z8542 Personal history of malignant neoplasm of other parts of uterus: Secondary | ICD-10-CM

## 2015-05-18 DIAGNOSIS — Z8249 Family history of ischemic heart disease and other diseases of the circulatory system: Secondary | ICD-10-CM

## 2015-05-18 DIAGNOSIS — I10 Essential (primary) hypertension: Secondary | ICD-10-CM | POA: Diagnosis not present

## 2015-05-18 DIAGNOSIS — E785 Hyperlipidemia, unspecified: Secondary | ICD-10-CM | POA: Diagnosis present

## 2015-05-18 DIAGNOSIS — T462X1A Poisoning by other antidysrhythmic drugs, accidental (unintentional), initial encounter: Secondary | ICD-10-CM

## 2015-05-18 DIAGNOSIS — N179 Acute kidney failure, unspecified: Secondary | ICD-10-CM | POA: Diagnosis present

## 2015-05-18 DIAGNOSIS — Z7901 Long term (current) use of anticoagulants: Secondary | ICD-10-CM

## 2015-05-18 DIAGNOSIS — K219 Gastro-esophageal reflux disease without esophagitis: Secondary | ICD-10-CM | POA: Diagnosis present

## 2015-05-18 DIAGNOSIS — E119 Type 2 diabetes mellitus without complications: Secondary | ICD-10-CM

## 2015-05-18 DIAGNOSIS — E032 Hypothyroidism due to medicaments and other exogenous substances: Secondary | ICD-10-CM | POA: Diagnosis present

## 2015-05-18 DIAGNOSIS — I4581 Long QT syndrome: Secondary | ICD-10-CM | POA: Diagnosis present

## 2015-05-18 DIAGNOSIS — R531 Weakness: Secondary | ICD-10-CM | POA: Diagnosis not present

## 2015-05-18 DIAGNOSIS — I482 Chronic atrial fibrillation, unspecified: Secondary | ICD-10-CM | POA: Diagnosis present

## 2015-05-18 DIAGNOSIS — F1110 Opioid abuse, uncomplicated: Secondary | ICD-10-CM | POA: Diagnosis present

## 2015-05-18 DIAGNOSIS — D696 Thrombocytopenia, unspecified: Secondary | ICD-10-CM | POA: Diagnosis not present

## 2015-05-18 DIAGNOSIS — D649 Anemia, unspecified: Secondary | ICD-10-CM | POA: Diagnosis present

## 2015-05-18 DIAGNOSIS — R55 Syncope and collapse: Secondary | ICD-10-CM | POA: Diagnosis not present

## 2015-05-18 DIAGNOSIS — Z794 Long term (current) use of insulin: Secondary | ICD-10-CM

## 2015-05-18 DIAGNOSIS — R197 Diarrhea, unspecified: Secondary | ICD-10-CM | POA: Diagnosis not present

## 2015-05-18 DIAGNOSIS — Z792 Long term (current) use of antibiotics: Secondary | ICD-10-CM

## 2015-05-18 DIAGNOSIS — E876 Hypokalemia: Secondary | ICD-10-CM | POA: Diagnosis present

## 2015-05-18 DIAGNOSIS — I951 Orthostatic hypotension: Secondary | ICD-10-CM

## 2015-05-18 DIAGNOSIS — I509 Heart failure, unspecified: Secondary | ICD-10-CM | POA: Diagnosis not present

## 2015-05-18 DIAGNOSIS — R001 Bradycardia, unspecified: Secondary | ICD-10-CM | POA: Diagnosis present

## 2015-05-18 HISTORY — DX: Orthostatic hypotension: I95.1

## 2015-05-18 LAB — URINE MICROSCOPIC-ADD ON

## 2015-05-18 LAB — URINALYSIS, ROUTINE W REFLEX MICROSCOPIC
GLUCOSE, UA: 250 mg/dL — AB
Ketones, ur: NEGATIVE mg/dL
Leukocytes, UA: NEGATIVE
Nitrite: NEGATIVE
PROTEIN: 30 mg/dL — AB
SPECIFIC GRAVITY, URINE: 1.024 (ref 1.005–1.030)
Urobilinogen, UA: 1 mg/dL (ref 0.0–1.0)
pH: 6 (ref 5.0–8.0)

## 2015-05-18 LAB — CBC WITH DIFFERENTIAL/PLATELET
Basophils Absolute: 0 10*3/uL (ref 0.0–0.1)
Basophils Relative: 0 % (ref 0–1)
EOS ABS: 0.1 10*3/uL (ref 0.0–0.7)
Eosinophils Relative: 1 % (ref 0–5)
HEMATOCRIT: 34.7 % — AB (ref 36.0–46.0)
HEMOGLOBIN: 11.7 g/dL — AB (ref 12.0–15.0)
LYMPHS ABS: 1.4 10*3/uL (ref 0.7–4.0)
LYMPHS PCT: 30 % (ref 12–46)
MCH: 32 pg (ref 26.0–34.0)
MCHC: 33.7 g/dL (ref 30.0–36.0)
MCV: 94.8 fL (ref 78.0–100.0)
MONOS PCT: 5 % (ref 3–12)
Monocytes Absolute: 0.2 10*3/uL (ref 0.1–1.0)
NEUTROS ABS: 2.9 10*3/uL (ref 1.7–7.7)
NEUTROS PCT: 63 % (ref 43–77)
Platelets: 108 10*3/uL — ABNORMAL LOW (ref 150–400)
RBC: 3.66 MIL/uL — AB (ref 3.87–5.11)
RDW: 12.7 % (ref 11.5–15.5)
WBC: 4.6 10*3/uL (ref 4.0–10.5)

## 2015-05-18 LAB — I-STAT CG4 LACTIC ACID, ED
Lactic Acid, Venous: 0.75 mmol/L (ref 0.5–2.0)
Lactic Acid, Venous: 1.67 mmol/L (ref 0.5–2.0)

## 2015-05-18 LAB — COMPREHENSIVE METABOLIC PANEL
ALK PHOS: 73 U/L (ref 38–126)
ALT: 27 U/L (ref 14–54)
ANION GAP: 7 (ref 5–15)
AST: 29 U/L (ref 15–41)
Albumin: 3 g/dL — ABNORMAL LOW (ref 3.5–5.0)
BILIRUBIN TOTAL: 1.1 mg/dL (ref 0.3–1.2)
BUN: 12 mg/dL (ref 6–20)
CALCIUM: 8.5 mg/dL — AB (ref 8.9–10.3)
CO2: 24 mmol/L (ref 22–32)
CREATININE: 1.08 mg/dL — AB (ref 0.44–1.00)
Chloride: 104 mmol/L (ref 101–111)
GFR calc non Af Amer: 54 mL/min — ABNORMAL LOW (ref >60)
Glucose, Bld: 227 mg/dL — ABNORMAL HIGH (ref 65–99)
Potassium: 3.6 mmol/L (ref 3.5–5.1)
SODIUM: 135 mmol/L (ref 135–145)
TOTAL PROTEIN: 5.5 g/dL — AB (ref 6.5–8.1)

## 2015-05-18 LAB — LIPASE, BLOOD: Lipase: 47 U/L (ref 22–51)

## 2015-05-18 LAB — TSH: TSH: 5.073 u[IU]/mL — AB (ref 0.350–4.500)

## 2015-05-18 LAB — CBG MONITORING, ED: GLUCOSE-CAPILLARY: 194 mg/dL — AB (ref 65–99)

## 2015-05-18 LAB — I-STAT TROPONIN, ED: Troponin i, poc: 0.04 ng/mL (ref 0.00–0.08)

## 2015-05-18 MED ORDER — FENTANYL CITRATE (PF) 100 MCG/2ML IJ SOLN
25.0000 ug | Freq: Once | INTRAMUSCULAR | Status: AC
  Administered 2015-05-19: 25 ug via INTRAVENOUS
  Filled 2015-05-18: qty 2

## 2015-05-18 NOTE — ED Notes (Signed)
Patient transported to CT 

## 2015-05-18 NOTE — ED Provider Notes (Signed)
CSN: [id number]     Arrival date & time 05/18/15  1931 History   First MD Initiated Contact with Patient 05/18/15 1950     Chief Complaint  Patient presents with  . Abdominal Pain    Patient is a 59 y.o. female presenting with general illness. The history is provided by the EMS personnel and a relative. The history is limited by the condition of the patient.  Illness Location:  NA Quality:  Syncope, fall Severity:  Unable to specify Onset quality:  Unable to specify Timing:  Sporadic Progression:  Unable to specify Chronicity:  New Context:  PMHx of HTN, DMT1, hypothyroidism, TIA, and PAF presenting via EMS for syncope and collapse. Indurative history obtained from husband. Patient was at home this evening and walking in living room when she moaned and then collapsed on the carpeted floor. Husband was immediately at patient side and denies any generalized shaking activity, incontinence, tongue biting, or known period of LOC. Husband noted disorientation for 30 seconds to 1 minute following which patient was talking with him normally.   Past Medical History  Diagnosis Date  . Type 1 diabetes   . Hypothyroid   . Degenerative disk disease   . Stomach ulcer   . Diverticulitis   . Syncope 01/2015  . Anxiety   . GERD (gastroesophageal reflux disease)   . History of hiatal hernia   . Cancer     HX OF CANCER OF UTERUS   . TIA (transient ischemic attack) 02/2015  . Hypertension   . Pancreatitis   . PAF (paroxysmal atrial fibrillation)     a. new onset 03/2015 in setting of intractable N/V; b. on Eliquis 5 mg bid; c. CHADSVASc at least 4 (DM, TIA x 2, female)   Past Surgical History  Procedure Laterality Date  . Hernia repair    . Abdominal hysterectomy    . Cholecystectomy    . Esophagogastroduodenoscopy N/A 04/04/2015    Procedure: ESOPHAGOGASTRODUODENOSCOPY (EGD);  Surgeon: Hulen Luster, MD;  Location: [facility] ENDOSCOPY;  Service: Endoscopy;  Laterality: N/A;   Family History  Problem  Relation Age of Onset  . Hypertension Mother   . CAD Sister    Social History  Substance Use Topics  . Smoking status: Never Smoker   . Smokeless tobacco: Never Used  . Alcohol Use: No   OB History    No data available      Review of Systems  Unable to perform ROS: Mental status change    Allergies  Erythromycin base; Rosiglitazone maleate; Codeine sulfate; and Tetanus-diphtheria toxoids td  Home Medications   Prior to Admission medications   Medication Sig Start Date End Date Taking? Authorizing Provider  amiodarone (PACERONE) 400 MG tablet Take 1 tablet (400 mg total) by mouth 2 (two) times daily. 04/07/15   Bettey Costa, MD  apixaban (ELIQUIS) 5 MG TABS tablet Take 1 tablet (5 mg total) by mouth 2 (two) times daily. 04/07/15   Bettey Costa, MD  atorvastatin (LIPITOR) 40 MG tablet Take 1 tablet (40 mg total) by mouth daily at 6 PM. 02/17/15   Bettey Costa, MD  ciprofloxacin (CIPRO) 500 MG tablet Take 1 tablet (500 mg total) by mouth 2 (two) times daily. 05/17/15   Carrie Mew, MD  citalopram (CELEXA) 20 MG tablet Take 1 tablet by mouth daily. 03/11/15   Historical Provider, MD  cloNIDine (CATAPRES) 0.1 MG tablet Take 1 tablet (0.1 mg total) by mouth 3 (three) times daily as needed. 04/10/15 04/09/16  Carrie Mew, MD  collagenase (SANTYL) ointment Apply topically daily. 04/07/15   Bettey Costa, MD  dicyclomine (BENTYL) 10 MG capsule Take 1 capsule by mouth 4 (four) times daily. 03/11/15   Historical Provider, MD  diltiazem (CARDIZEM CD) 120 MG 24 hr capsule Take 1 capsule (120 mg total) by mouth daily. 04/07/15   Bettey Costa, MD  diphenhydrAMINE (BENADRYL) 25 mg capsule Take 2 capsules (50 mg total) by mouth every 6 (six) hours as needed. 05/17/15   Carrie Mew, MD  HYDROcodone-acetaminophen (NORCO/VICODIN) 5-325 MG per tablet Take 1 tablet by mouth every 6 (six) hours as needed for moderate pain.    Historical Provider, MD  JANUVIA 100 MG tablet Take 100 mg by mouth daily. 12/15/14    Historical Provider, MD  lisinopril (PRINIVIL,ZESTRIL) 5 MG tablet Take 1 tablet by mouth daily. 03/15/15   Historical Provider, MD  loperamide (IMODIUM A-D) 2 MG tablet Take 2 tablets (4 mg total) by mouth 4 (four) times daily as needed for diarrhea or loose stools. Patient not taking: Reported on 04/13/2015 04/10/15   Carrie Mew, MD  metFORMIN (GLUCOPHAGE) 1000 MG tablet Take 1 tablet by mouth 2 (two) times daily. 03/11/15   Historical Provider, MD  metoCLOPramide (REGLAN) 10 MG tablet Take 1 tablet (10 mg total) by mouth 4 (four) times daily -  before meals and at bedtime. 05/17/15   Carrie Mew, MD  metoprolol succinate (TOPROL-XL) 50 MG 24 hr tablet Take 1 tablet (50 mg total) by mouth daily. 03/14/15   Henreitta Leber, MD  metroNIDAZOLE (FLAGYL) 500 MG tablet Take 1 tablet (500 mg total) by mouth 3 (three) times daily. 05/17/15   Carrie Mew, MD  morphine (MS CONTIN) 100 MG 12 hr tablet Take 100 mg by mouth 2 (two) times daily.     Historical Provider, MD  ondansetron (ZOFRAN ODT) 8 MG disintegrating tablet Take 1 tablet (8 mg total) by mouth every 8 (eight) hours as needed for nausea or vomiting. 04/10/15   Carrie Mew, MD  pantoprazole (PROTONIX) 40 MG tablet Take 1 tablet (40 mg total) by mouth 2 (two) times daily. 04/07/15   Bettey Costa, MD  sucralfate (CARAFATE) 1 G tablet Take 1 tablet (1 g total) by mouth 4 (four) times daily. 04/13/15 04/12/16  Loney Hering, MD  tiZANidine (ZANAFLEX) 4 MG tablet Take 1 tablet by mouth every 6 (six) hours as needed for muscle spasms.     Historical Provider, MD  zolpidem (AMBIEN) 5 MG tablet Take 1 tablet by mouth at bedtime.    Historical Provider, MD   BP 143/63 mmHg  Pulse 42  Temp(Src) 98 F (36.7 C)  Resp 15  SpO2 98%   Physical Exam  Constitutional: No distress.  HENT:  Head: Normocephalic and atraumatic.  Eyes: Conjunctivae are normal. Pupils are equal, round, and reactive to light.  Neck: Normal range of motion. Neck supple. No  tracheal deviation present.  Cardiovascular: Intact distal pulses.   HR 50s, mildly hypertensive with SBP's and 160s  Pulmonary/Chest: Effort normal and breath sounds normal. No respiratory distress.  Abdominal: Soft. Bowel sounds are normal. She exhibits no distension. There is tenderness. There is no rebound and no guarding.  Musculoskeletal: Normal range of motion.  Neurological: She is alert.   Patient will open eyes spontaneous, is moving all extremities, follow commands, and will speak in short sentences (patient is very soft-spoken and is difficult to understand)  Skin: She is not diaphoretic.    ED Course  Procedures (including critical care time) Labs Review Labs Reviewed  CBC WITH DIFFERENTIAL/PLATELET - Abnormal; Notable for the following:    RBC 3.66 (*)    Hemoglobin 11.7 (*)    HCT 34.7 (*)    Platelets 108 (*)    All other components within normal limits  COMPREHENSIVE METABOLIC PANEL - Abnormal; Notable for the following:    Glucose, Bld 227 (*)    Creatinine, Ser 1.08 (*)    Calcium 8.5 (*)    Total Protein 5.5 (*)    Albumin 3.0 (*)    GFR calc non Af Amer 54 (*)    All other components within normal limits  URINALYSIS, ROUTINE W REFLEX MICROSCOPIC (NOT AT [facility]) - Abnormal; Notable for the following:    Color, Urine AMBER (*)    APPearance CLOUDY (*)    Glucose, UA 250 (*)    Hgb urine dipstick LARGE (*)    Bilirubin Urine SMALL (*)    Protein, ur 30 (*)    All other components within normal limits  TSH - Abnormal; Notable for the following:    TSH 5.073 (*)    All other components within normal limits  URINE MICROSCOPIC-ADD ON - Abnormal; Notable for the following:    Casts HYALINE CASTS (*)    All other components within normal limits  CBG MONITORING, ED - Abnormal; Notable for the following:    Glucose-Capillary 194 (*)    All other components within normal limits  LIPASE, BLOOD  I-STAT CG4 LACTIC ACID, ED  I-STAT TROPOININ, ED  I-STAT CG4 LACTIC  ACID, ED    Imaging Review Dg Chest 2 View  05/18/2015   CLINICAL DATA:  Epigastric pain, nausea and vomiting for 3 days.  EXAM: CHEST  2 VIEW  COMPARISON:  PA and lateral chest 05/17/2015.  FINDINGS: The lungs are clear. Heart size is upper normal. No pneumothorax or pleural effusion. Scoliosis noted  IMPRESSION: No acute disease.   Electronically Signed   By: Inge Rise M.D.   On: 05/18/2015 21:14   Dg Chest 2 View  05/17/2015   CLINICAL DATA:  Epigastric chest pain nausea and vomiting for 3 days  EXAM: CHEST  2 VIEW  COMPARISON:  04/05/2015  FINDINGS: Scoliosis thoracolumbar spine. Heart size and vascular pattern normal. Lungs clear. No pleural effusions.  IMPRESSION: No acute finding   Electronically Signed   By: Skipper Cliche M.D.   On: 05/17/2015 18:16   Ct Head Wo Contrast  05/18/2015   CLINICAL DATA:  59 year old female with fall and headache  EXAM: CT HEAD WITHOUT CONTRAST  TECHNIQUE: Contiguous axial images were obtained from the base of the skull through the vertex without intravenous contrast.  COMPARISON:  CT dated 04/13/2015  FINDINGS: The ventricles and sulci are appropriate in size for the patient's age. There is no intracranial hemorrhage. No mass effect or midline shift identified. The gray-white matter differentiation is preserved. There is no extra-axial fluid collection.  The visualized paranasal sinuses and mastoid air cells are well aerated. The calvarium is intact.  IMPRESSION: No acute intracranial pathology.   Electronically Signed   By: Anner Crete M.D.   On: 05/18/2015 22:05   Ct Abdomen Pelvis W Contrast  05/17/2015   CLINICAL DATA:  Pt c/o generalized abd pain with diarrhea x3 days  EXAM: CT ABDOMEN AND PELVIS WITH CONTRAST  TECHNIQUE: Multidetector CT imaging of the abdomen and pelvis was performed using the standard protocol following bolus administration of intravenous contrast.  CONTRAST:  158m OMNIPAQUE IOHEXOL 300 MG/ML  SOLN  COMPARISON:  04/03/2015   FINDINGS: Lower chest:  Clear  Hepatobiliary: Gallbladder surgically absent  Pancreas: Normal  Spleen: Normal  Adrenals/Urinary Tract: Mild dilatation of both ureters with no ureteral stones, likely due to mild bladder distention. 779mobstructing stone or pole right kidney  Stomach/Bowel: Proximal jejunum show significant wall thickening involving several loops. She mild intussusception identified involving left upper quadrant jejunum. Mild dilatation both proximal and distal to this. Contrast seen well beyond this area into more distal small bowel.  Vascular/Lymphatic: No acute findings. Mild calcification aortoiliac vessels.  Reproductive: Not identified  Other: Small volume ascites  Musculoskeletal: No acute musculoskeletal findings  IMPRESSION: Significant inflammation involving multiple loops of proximal jejunum with mild nonobstructing intussusception   Electronically Signed   By: RaSkipper Cliche.D.   On: 05/17/2015 19:50   I have personally reviewed and evaluated these images and lab results as part of my medical decision-making.   EKG Interpretation None      MDM  Ms. PoDeshazos a [age]o female w/ PMHx of HTN, DMT1, hypothyroidism, TIA, and PAF presenting via EMS for syncope and collapse. Indurative history obtained from husband. Patient was at home this evening and walking in living room when she moaned and then collapsed on the carpeted floor. Husband was immediately at patient side and denies any generalized shaking activity, incontinence, tongue biting, or known period of LOC. Husband noted disorientation for 30 seconds to 1 minute following which patient was talking with him normally. EMS contacted for syncope.  Exam above notable for elderly female lying in stretcher in no acute distress. Lethargic. Afebrile. Heart rate 50s. Not tachypneic. Breathing well on room air and maintaining saturations without supplemental oxygen. Mildly hypertensive with SBP in 160s. Patient will open eyes  spontaneous, is moving all extremities, follow commands, and will speak in short sentences (patient is very soft-spoken and is difficult to understand).   POCT glucose within normal limits. EKG showing no ST elevation/depression. First troponin 0.04. UA negative for infection or severe dehydration. CBC 4.6. Hemoglobin low 0.6. Lactic acid 1.67. TSH 5.0. Chest x-ray showing no acute cardiopulmonary process. CT head showing no acute intracranial abnormality.  Patient admitted to hospitals for further evaluation and management of acute syncope and collapse. Spoke with hospitalists about recent jejunitis and addition of ciprofloxacin in the setting of tizanidine use at home and believe this may be the cause for patient's symptoms. Patient's family understands and agrees with the plan and has no further questions concerning this time.  Pt care discussed with and followed by my attending, Dr. NaDavonna Belling Final diagnoses:  Syncope and collapse    RyMayer CamelMD 05/19/15 0020  NaDavonna BellingMD 05/19/15 2129

## 2015-05-18 NOTE — ED Notes (Signed)
The pt arrived by gems from home.  She was seen at [facility]  Ed this am  The family came home found her in a recliner and thought she was unresponsive.  However when the paramedics arrived they recognized  Her.  She is frequent pt for ems.  She refused an iv  Very drowsy she reports she took morphine.  She refused an iv..  She gets irritable when she has questions asked.  Hx of bradycardia

## 2015-05-18 NOTE — ED Notes (Signed)
The pt will not answer questions except in a whisper.  She is irritated  When we attempt to wake her up more.  Stays with her eyes closed  C/o a headache abd and chest pain.  familky not here yet  Unable to do a glascow  Pt not c0-ooperating

## 2015-05-18 NOTE — ED Notes (Signed)
CBG 194

## 2015-05-19 ENCOUNTER — Inpatient Hospital Stay (HOSPITAL_COMMUNITY): Payer: Medicare HMO

## 2015-05-19 ENCOUNTER — Encounter (HOSPITAL_COMMUNITY): Payer: Self-pay | Admitting: Internal Medicine

## 2015-05-19 ENCOUNTER — Ambulatory Visit: Payer: Medicare HMO | Admitting: Primary Care

## 2015-05-19 DIAGNOSIS — R197 Diarrhea, unspecified: Secondary | ICD-10-CM

## 2015-05-19 DIAGNOSIS — I10 Essential (primary) hypertension: Secondary | ICD-10-CM

## 2015-05-19 DIAGNOSIS — K529 Noninfective gastroenteritis and colitis, unspecified: Secondary | ICD-10-CM | POA: Diagnosis present

## 2015-05-19 DIAGNOSIS — D649 Anemia, unspecified: Secondary | ICD-10-CM | POA: Diagnosis present

## 2015-05-19 DIAGNOSIS — R9431 Abnormal electrocardiogram [ECG] [EKG]: Secondary | ICD-10-CM

## 2015-05-19 DIAGNOSIS — R1013 Epigastric pain: Secondary | ICD-10-CM

## 2015-05-19 DIAGNOSIS — R55 Syncope and collapse: Secondary | ICD-10-CM

## 2015-05-19 DIAGNOSIS — I482 Chronic atrial fibrillation, unspecified: Secondary | ICD-10-CM | POA: Diagnosis present

## 2015-05-19 DIAGNOSIS — D696 Thrombocytopenia, unspecified: Secondary | ICD-10-CM | POA: Diagnosis present

## 2015-05-19 DIAGNOSIS — E1165 Type 2 diabetes mellitus with hyperglycemia: Secondary | ICD-10-CM

## 2015-05-19 DIAGNOSIS — E119 Type 2 diabetes mellitus without complications: Secondary | ICD-10-CM

## 2015-05-19 DIAGNOSIS — N179 Acute kidney failure, unspecified: Secondary | ICD-10-CM | POA: Diagnosis present

## 2015-05-19 DIAGNOSIS — I4581 Long QT syndrome: Secondary | ICD-10-CM

## 2015-05-19 DIAGNOSIS — I480 Paroxysmal atrial fibrillation: Secondary | ICD-10-CM

## 2015-05-19 DIAGNOSIS — R001 Bradycardia, unspecified: Secondary | ICD-10-CM | POA: Diagnosis present

## 2015-05-19 DIAGNOSIS — I9589 Other hypotension: Secondary | ICD-10-CM | POA: Diagnosis present

## 2015-05-19 LAB — CBC
HEMATOCRIT: 34.1 % — AB (ref 36.0–46.0)
HEMOGLOBIN: 11.5 g/dL — AB (ref 12.0–15.0)
MCH: 32.1 pg (ref 26.0–34.0)
MCHC: 33.7 g/dL (ref 30.0–36.0)
MCV: 95.3 fL (ref 78.0–100.0)
PLATELETS: 92 10*3/uL — AB (ref 150–400)
RBC: 3.58 MIL/uL — AB (ref 3.87–5.11)
RDW: 12.8 % (ref 11.5–15.5)
WBC: 3.7 10*3/uL — AB (ref 4.0–10.5)

## 2015-05-19 LAB — MRSA PCR SCREENING: MRSA by PCR: NEGATIVE

## 2015-05-19 LAB — LIPID PANEL
CHOLESTEROL: 113 mg/dL (ref 0–200)
HDL: 53 mg/dL (ref >40)
LDL Cholesterol: 51 mg/dL (ref 0–99)
Total CHOL/HDL Ratio: 2.1 RATIO
Triglycerides: 44 mg/dL (ref <150)
VLDL: 9 mg/dL (ref 0–40)

## 2015-05-19 LAB — COMPREHENSIVE METABOLIC PANEL
ALT: 28 U/L (ref 14–54)
ANION GAP: 9 (ref 5–15)
AST: 34 U/L (ref 15–41)
Albumin: 2.9 g/dL — ABNORMAL LOW (ref 3.5–5.0)
Alkaline Phosphatase: 71 U/L (ref 38–126)
BUN: 12 mg/dL (ref 6–20)
CHLORIDE: 104 mmol/L (ref 101–111)
CO2: 22 mmol/L (ref 22–32)
CREATININE: 1.02 mg/dL — AB (ref 0.44–1.00)
Calcium: 8.3 mg/dL — ABNORMAL LOW (ref 8.9–10.3)
GFR, EST NON AFRICAN AMERICAN: 58 mL/min — AB (ref >60)
Glucose, Bld: 266 mg/dL — ABNORMAL HIGH (ref 65–99)
POTASSIUM: 3.1 mmol/L — AB (ref 3.5–5.1)
SODIUM: 135 mmol/L (ref 135–145)
Total Bilirubin: 1 mg/dL (ref 0.3–1.2)
Total Protein: 5.3 g/dL — ABNORMAL LOW (ref 6.5–8.1)

## 2015-05-19 LAB — GLUCOSE, CAPILLARY
GLUCOSE-CAPILLARY: 220 mg/dL — AB (ref 65–99)
GLUCOSE-CAPILLARY: 116 mg/dL — AB (ref 65–99)
GLUCOSE-CAPILLARY: 207 mg/dL — AB (ref 65–99)
GLUCOSE-CAPILLARY: 144 mg/dL — AB (ref 65–99)

## 2015-05-19 LAB — T4, FREE: Free T4: 1.09 ng/dL (ref 0.61–1.12)

## 2015-05-19 LAB — TROPONIN I: Troponin I: <0.03 ng/mL (ref <0.031)

## 2015-05-19 LAB — MAGNESIUM: Magnesium: 1.6 mg/dL — ABNORMAL LOW (ref 1.7–2.4)

## 2015-05-19 MED ORDER — LINAGLIPTIN 5 MG PO TABS
5.0000 mg | ORAL_TABLET | Freq: Every day | ORAL | Status: DC
  Administered 2015-05-19: 5 mg via ORAL
  Filled 2015-05-19: qty 1

## 2015-05-19 MED ORDER — MORPHINE SULFATE ER 100 MG PO TBCR
100.0000 mg | EXTENDED_RELEASE_TABLET | Freq: Two times a day (BID) | ORAL | Status: DC
  Administered 2015-05-19: 100 mg via ORAL
  Filled 2015-05-19: qty 1

## 2015-05-19 MED ORDER — ATORVASTATIN CALCIUM 40 MG PO TABS
40.0000 mg | ORAL_TABLET | Freq: Every day | ORAL | Status: DC
  Administered 2015-05-19 – 2015-05-21 (×2): 40 mg via ORAL
  Filled 2015-05-19 (×4): qty 1

## 2015-05-19 MED ORDER — SUCRALFATE 1 G PO TABS
1.0000 g | ORAL_TABLET | Freq: Four times a day (QID) | ORAL | Status: DC
  Filled 2015-05-19 (×5): qty 1

## 2015-05-19 MED ORDER — INSULIN ASPART 100 UNIT/ML ~~LOC~~ SOLN
0.0000 [IU] | Freq: Three times a day (TID) | SUBCUTANEOUS | Status: DC
  Administered 2015-05-19 (×2): 3 [IU] via SUBCUTANEOUS

## 2015-05-19 MED ORDER — DEXTROSE 5 % IV SOLN
1.0000 g | INTRAVENOUS | Status: DC
  Administered 2015-05-19 – 2015-05-20 (×2): 1 g via INTRAVENOUS
  Filled 2015-05-19 (×3): qty 10

## 2015-05-19 MED ORDER — PANTOPRAZOLE SODIUM 40 MG PO TBEC
40.0000 mg | DELAYED_RELEASE_TABLET | Freq: Two times a day (BID) | ORAL | Status: DC
  Administered 2015-05-19: 40 mg via ORAL

## 2015-05-19 MED ORDER — MORPHINE SULFATE (PF) 2 MG/ML IV SOLN
2.0000 mg | INTRAVENOUS | Status: DC | PRN
  Administered 2015-05-19 – 2015-05-21 (×6): 2 mg via INTRAVENOUS
  Filled 2015-05-19 (×7): qty 1

## 2015-05-19 MED ORDER — INSULIN ASPART 100 UNIT/ML ~~LOC~~ SOLN
0.0000 [IU] | SUBCUTANEOUS | Status: DC
  Administered 2015-05-19: 2 [IU] via SUBCUTANEOUS
  Administered 2015-05-20 (×2): 3 [IU] via SUBCUTANEOUS
  Administered 2015-05-20: 8 [IU] via SUBCUTANEOUS
  Administered 2015-05-20: 2 [IU] via SUBCUTANEOUS
  Administered 2015-05-21: 3 [IU] via SUBCUTANEOUS
  Administered 2015-05-21 (×2): 5 [IU] via SUBCUTANEOUS
  Administered 2015-05-21: 8 [IU] via SUBCUTANEOUS
  Administered 2015-05-21: 3 [IU] via SUBCUTANEOUS
  Administered 2015-05-21: 5 [IU] via SUBCUTANEOUS
  Administered 2015-05-22: 2 [IU] via SUBCUTANEOUS
  Administered 2015-05-22: 5 [IU] via SUBCUTANEOUS
  Administered 2015-05-22: 3 [IU] via SUBCUTANEOUS
  Administered 2015-05-22: 5 [IU] via SUBCUTANEOUS

## 2015-05-19 MED ORDER — PNEUMOCOCCAL VAC POLYVALENT 25 MCG/0.5ML IJ INJ
0.5000 mL | INJECTION | INTRAMUSCULAR | Status: AC
  Administered 2015-05-20: 0.5 mL via INTRAMUSCULAR
  Filled 2015-05-19: qty 0.5

## 2015-05-19 MED ORDER — MAGNESIUM SULFATE 50 % IJ SOLN
3.0000 g | Freq: Once | INTRAVENOUS | Status: AC
  Administered 2015-05-19: 3 g via INTRAVENOUS
  Filled 2015-05-19: qty 6

## 2015-05-19 MED ORDER — APIXABAN 5 MG PO TABS
5.0000 mg | ORAL_TABLET | Freq: Two times a day (BID) | ORAL | Status: DC
  Administered 2015-05-19 – 2015-05-22 (×6): 5 mg via ORAL
  Filled 2015-05-19 (×10): qty 1

## 2015-05-19 MED ORDER — CITALOPRAM HYDROBROMIDE 20 MG PO TABS
20.0000 mg | ORAL_TABLET | Freq: Every day | ORAL | Status: DC
  Administered 2015-05-19 – 2015-05-22 (×3): 20 mg via ORAL
  Filled 2015-05-19 (×4): qty 1

## 2015-05-19 MED ORDER — ENSURE ENLIVE PO LIQD
237.0000 mL | Freq: Three times a day (TID) | ORAL | Status: DC
  Administered 2015-05-19 – 2015-05-21 (×3): 237 mL via ORAL

## 2015-05-19 MED ORDER — FENTANYL CITRATE (PF) 100 MCG/2ML IJ SOLN
50.0000 ug | Freq: Once | INTRAMUSCULAR | Status: AC
  Administered 2015-05-19: 50 ug via INTRAVENOUS
  Filled 2015-05-19: qty 2

## 2015-05-19 MED ORDER — ENSURE ENLIVE PO LIQD: 237.0000 mL | Freq: Two times a day (BID) | ORAL | Status: DC

## 2015-05-19 MED ORDER — METRONIDAZOLE IN NACL 5-0.79 MG/ML-% IV SOLN
500.0000 mg | Freq: Three times a day (TID) | INTRAVENOUS | Status: DC
  Administered 2015-05-19 – 2015-05-21 (×8): 500 mg via INTRAVENOUS
  Filled 2015-05-19 (×11): qty 100

## 2015-05-19 MED ORDER — POTASSIUM CHLORIDE 10 MEQ/100ML IV SOLN
10.0000 meq | INTRAVENOUS | Status: AC
  Administered 2015-05-19 (×4): 10 meq via INTRAVENOUS
  Filled 2015-05-19 (×2): qty 100

## 2015-05-19 MED ORDER — MORPHINE SULFATE ER 30 MG PO TBCR
45.0000 mg | EXTENDED_RELEASE_TABLET | Freq: Two times a day (BID) | ORAL | Status: DC
  Administered 2015-05-19 – 2015-05-22 (×5): 45 mg via ORAL
  Filled 2015-05-19 (×2): qty 3
  Filled 2015-05-19: qty 1
  Filled 2015-05-19: qty 3
  Filled 2015-05-19 (×3): qty 1

## 2015-05-19 MED ORDER — SODIUM CHLORIDE 0.9 % IV SOLN
INTRAVENOUS | Status: AC
  Administered 2015-05-19: 04:00:00 via INTRAVENOUS

## 2015-05-19 MED ORDER — SUCRALFATE 1 G PO TABS
1.0000 g | ORAL_TABLET | Freq: Three times a day (TID) | ORAL | Status: DC
  Administered 2015-05-19 – 2015-05-22 (×10): 1 g via ORAL
  Filled 2015-05-19 (×16): qty 1

## 2015-05-19 MED ORDER — SODIUM CHLORIDE 0.9 % IV BOLUS (SEPSIS)
1000.0000 mL | Freq: Once | INTRAVENOUS | Status: AC
  Administered 2015-05-19: 1000 mL via INTRAVENOUS

## 2015-05-19 MED ORDER — ONDANSETRON HCL 4 MG/2ML IJ SOLN
4.0000 mg | Freq: Four times a day (QID) | INTRAMUSCULAR | Status: DC | PRN
  Administered 2015-05-19 – 2015-05-21 (×4): 4 mg via INTRAVENOUS
  Filled 2015-05-19 (×5): qty 2

## 2015-05-19 MED ORDER — ZOLPIDEM TARTRATE 5 MG PO TABS
5.0000 mg | ORAL_TABLET | Freq: Every day | ORAL | Status: DC
  Administered 2015-05-19 – 2015-05-21 (×3): 5 mg via ORAL
  Filled 2015-05-19 (×3): qty 1

## 2015-05-19 MED ORDER — PROMETHAZINE HCL 25 MG/ML IJ SOLN
12.5000 mg | Freq: Once | INTRAMUSCULAR | Status: AC
  Administered 2015-05-19: 12.5 mg via INTRAVENOUS
  Filled 2015-05-19: qty 1

## 2015-05-19 MED ORDER — PANTOPRAZOLE SODIUM 40 MG IV SOLR
40.0000 mg | Freq: Two times a day (BID) | INTRAVENOUS | Status: DC
  Administered 2015-05-19 – 2015-05-20 (×3): 40 mg via INTRAVENOUS
  Filled 2015-05-19 (×6): qty 40

## 2015-05-19 MED ORDER — CIPROFLOXACIN IN D5W 400 MG/200ML IV SOLN: 400.0000 mg | Freq: Two times a day (BID) | INTRAVENOUS | Status: DC

## 2015-05-19 MED ORDER — HYDRALAZINE HCL 20 MG/ML IJ SOLN
10.0000 mg | INTRAMUSCULAR | Status: DC | PRN
  Administered 2015-05-21: 10 mg via INTRAVENOUS
  Filled 2015-05-19: qty 1

## 2015-05-19 MED ORDER — AMIODARONE HCL 200 MG PO TABS
400.0000 mg | ORAL_TABLET | Freq: Two times a day (BID) | ORAL | Status: DC
  Administered 2015-05-19 – 2015-05-22 (×6): 400 mg via ORAL
  Filled 2015-05-19 (×10): qty 2

## 2015-05-19 MED ORDER — METOCLOPRAMIDE HCL 10 MG PO TABS
10.0000 mg | ORAL_TABLET | Freq: Three times a day (TID) | ORAL | Status: DC
  Administered 2015-05-19 – 2015-05-22 (×11): 10 mg via ORAL
  Filled 2015-05-19 (×18): qty 1

## 2015-05-19 MED ORDER — INFLUENZA VAC SPLIT QUAD 0.5 ML IM SUSY
0.5000 mL | PREFILLED_SYRINGE | INTRAMUSCULAR | Status: DC
  Filled 2015-05-19: qty 0.5

## 2015-05-19 MED ORDER — HYDROCODONE-ACETAMINOPHEN 5-325 MG PO TABS
1.0000 | ORAL_TABLET | Freq: Four times a day (QID) | ORAL | Status: DC | PRN
  Administered 2015-05-19 (×2): 1 via ORAL
  Filled 2015-05-19 (×2): qty 1

## 2015-05-19 MED ORDER — HYDROCODONE-ACETAMINOPHEN 5-325 MG PO TABS
1.0000 | ORAL_TABLET | Freq: Three times a day (TID) | ORAL | Status: DC | PRN
  Administered 2015-05-21: 1 via ORAL
  Filled 2015-05-19: qty 1

## 2015-05-19 NOTE — Progress Notes (Signed)
Pt transferred from ED to 3s13 after report received from RN.  Pt oriented to room and surroundings with call bell placed within reach.  Pt complains of upper stomach pain and nausea, NP on call notified and orders given.  Will continue to monitor.

## 2015-05-19 NOTE — H&P (Addendum)
[facility] History and Physical  [name] IOE:[id number] DOB: 07/10/1956 DOA: 05/18/2015  Referring physician: Dr. Claretta Fraise. PCP: Alvester Chou, NP  Specialists: None.  Chief Complaint: Loss of consciousness.  HPI: [name] is a 59 y.o. female with history of recently diagnosed atrial fibrillation on Apixaban was brought to the ER after patient had an episode of syncope. As per the husband who provided the history to the ER physician patient is walking the hall when patient suddenly lost consciousness for about 2-3 minutes. Patient did not have any seizure-like activity but did have incontinence of urine. Patient states over the last 2-3 days patient has been having recurrent episodes of diarrhea with epigastric pain. Patient come to the ER yesterday and CT abdomen showed jejunal inflammation and was discharged home on Cipro and Flagyl. Patient's pain is mostly in the epigastric area. In the ER patient was found to have sinus bradycardia with heart rate around 43 bpm with prolonged QTC of 507 ms. Patient was not hypotensive. Creatinine was mildly elevated. Patient denies any vomiting. Patient was able to take her medications. TSH is mildly elevated around 5. Patient otherwise denies any chest pain or shortness of breath. Patient was admitted at [facility] in July for abdominal pain and at that time had endoscopy which showed diffuse gastritis. During cementation patient also was in A. fib with RVR during which patient was started on amiodarone and Cardizem and metoprolol. Patient at this time is being given IV fluids and admitted for further management for syncope. Patient states over the last few days patient gets dizzy on standing.   Review of Systems: As presented in the history of presenting illness, rest negative.  Past Medical History  Diagnosis Date  . Type 1 diabetes   . Hypothyroid   . Degenerative disk disease   . Stomach ulcer   . Diverticulitis   .  Syncope 01/2015  . Anxiety   . GERD (gastroesophageal reflux disease)   . History of hiatal hernia   . Cancer     HX OF CANCER OF UTERUS   . TIA (transient ischemic attack) 02/2015  . Hypertension   . Pancreatitis   . PAF (paroxysmal atrial fibrillation)     a. new onset 03/2015 in setting of intractable N/V; b. on Eliquis 5 mg bid; c. CHADSVASc at least 4 (DM, TIA x 2, female)   Past Surgical History  Procedure Laterality Date  . Hernia repair    . Abdominal hysterectomy    . Cholecystectomy    . Esophagogastroduodenoscopy N/A 04/04/2015    Procedure: ESOPHAGOGASTRODUODENOSCOPY (EGD);  Surgeon: Hulen Luster, MD;  Location: [facility] ENDOSCOPY;  Service: Endoscopy;  Laterality: N/A;   Social History:  reports that she has never smoked. She has never used smokeless tobacco. She reports that she does not drink alcohol or use illicit drugs. Where does patient live at home. Can patient participate in ADLs? Yes.  Allergies  Allergen Reactions  . Erythromycin Base Other (See Comments)    Reaction:  Fever   . Rosiglitazone Maleate Swelling  . Codeine Sulfate Rash  . Tetanus-Diphtheria Toxoids Td Rash and Other (See Comments)    Reaction:  Fever     Family History:  Family History  Problem Relation Age of Onset  . Hypertension Mother   . CAD Sister       Prior to Admission medications   Medication Sig Start Date End Date Taking? Authorizing Provider  metoprolol succinate (TOPROL-XL) 50 MG 24  hr tablet Take 1 tablet (50 mg total) by mouth daily. 03/14/15  Yes Henreitta Leber, MD  zolpidem (AMBIEN) 5 MG tablet Take 1 tablet by mouth at bedtime.   Yes Historical Provider, MD  amiodarone (PACERONE) 400 MG tablet Take 1 tablet (400 mg total) by mouth 2 (two) times daily. 04/07/15   Bettey Costa, MD  apixaban (ELIQUIS) 5 MG TABS tablet Take 1 tablet (5 mg total) by mouth 2 (two) times daily. 04/07/15   Bettey Costa, MD  atorvastatin (LIPITOR) 40 MG tablet Take 1 tablet (40 mg total) by mouth daily at 6  PM. 02/17/15   Bettey Costa, MD  ciprofloxacin (CIPRO) 500 MG tablet Take 1 tablet (500 mg total) by mouth 2 (two) times daily. 05/17/15   Carrie Mew, MD  citalopram (CELEXA) 20 MG tablet Take 1 tablet by mouth daily. 03/11/15   Historical Provider, MD  cloNIDine (CATAPRES) 0.1 MG tablet Take 1 tablet (0.1 mg total) by mouth 3 (three) times daily as needed. 04/10/15 04/09/16  Carrie Mew, MD  collagenase (SANTYL) ointment Apply topically daily. 04/07/15   Bettey Costa, MD  dicyclomine (BENTYL) 10 MG capsule Take 1 capsule by mouth 4 (four) times daily. 03/11/15   Historical Provider, MD  diltiazem (CARDIZEM CD) 120 MG 24 hr capsule Take 1 capsule (120 mg total) by mouth daily. 04/07/15   Bettey Costa, MD  diphenhydrAMINE (BENADRYL) 25 mg capsule Take 2 capsules (50 mg total) by mouth every 6 (six) hours as needed. 05/17/15   Carrie Mew, MD  HYDROcodone-acetaminophen (NORCO/VICODIN) 5-325 MG per tablet Take 1 tablet by mouth every 6 (six) hours as needed for moderate pain.    Historical Provider, MD  JANUVIA 100 MG tablet Take 100 mg by mouth daily. 12/15/14   Historical Provider, MD  lisinopril (PRINIVIL,ZESTRIL) 5 MG tablet Take 1 tablet by mouth daily. 03/15/15   Historical Provider, MD  loperamide (IMODIUM A-D) 2 MG tablet Take 2 tablets (4 mg total) by mouth 4 (four) times daily as needed for diarrhea or loose stools. Patient not taking: Reported on 04/13/2015 04/10/15   Carrie Mew, MD  metFORMIN (GLUCOPHAGE) 1000 MG tablet Take 1 tablet by mouth 2 (two) times daily. 03/11/15   Historical Provider, MD  metoCLOPramide (REGLAN) 10 MG tablet Take 1 tablet (10 mg total) by mouth 4 (four) times daily -  before meals and at bedtime. 05/17/15   Carrie Mew, MD  metroNIDAZOLE (FLAGYL) 500 MG tablet Take 1 tablet (500 mg total) by mouth 3 (three) times daily. 05/17/15   Carrie Mew, MD  morphine (MS CONTIN) 100 MG 12 hr tablet Take 100 mg by mouth 2 (two) times daily.     Historical Provider, MD   ondansetron (ZOFRAN ODT) 8 MG disintegrating tablet Take 1 tablet (8 mg total) by mouth every 8 (eight) hours as needed for nausea or vomiting. 04/10/15   Carrie Mew, MD  pantoprazole (PROTONIX) 40 MG tablet Take 1 tablet (40 mg total) by mouth 2 (two) times daily. 04/07/15   Bettey Costa, MD  sucralfate (CARAFATE) 1 G tablet Take 1 tablet (1 g total) by mouth 4 (four) times daily. 04/13/15 04/12/16  Loney Hering, MD  tiZANidine (ZANAFLEX) 4 MG tablet Take 1 tablet by mouth every 6 (six) hours as needed for muscle spasms.     Historical Provider, MD    Physical Exam: Filed Vitals:   05/18/15 2315 05/18/15 2345 05/19/15 0000 05/19/15 0007  BP: 131/55 143/63 135/63   Pulse: 42 42  41   Temp:    98 F (36.7 C)  Resp: 18 15 19    SpO2: 99% 98% 98%      General:  Moderately built and nourished.  Eyes: Anicteric. No pallor.  ENT: No discharge from the ears eyes nose and mouth.  Neck: No mass felt. No JVD appreciated.  Cardiovascular: S1 and S2 heard.  Respiratory: No crepitations.  Abdomen: Mild epigastric tenderness no guarding or rigidity.  Skin: No rash.  Musculoskeletal: No edema. Pain on moving both upper extremities which patient states is chronic.  Psychiatric: Appears normal.  Neurologic: Alert awake oriented to time place and person. Moves all extremities 5 x 5. No facial asymmetry. Tongue is midline.  Labs on Admission:  Basic Metabolic Panel:  Recent Labs Lab 05/17/15 1736 05/18/15 2025  NA 128* 135  K 3.2* 3.6  CL 101 104  CO2 22 24  GLUCOSE 309* 227*  BUN 19 12  CREATININE 0.96 1.08*  CALCIUM 7.8* 8.5*   Liver Function Tests:  Recent Labs Lab 05/17/15 1736 05/18/15 2025  AST 34 29  ALT 24 27  ALKPHOS 71 73  BILITOT 1.1 1.1  PROT 5.5* 5.5*  ALBUMIN 2.9* 3.0*    Recent Labs Lab 05/17/15 1736 05/18/15 2025  LIPASE 48 47   No results for input(s): AMMONIA in the last 168 hours. CBC:  Recent Labs Lab 05/17/15 1736 05/18/15 2025   WBC 5.6 4.6  NEUTROABS  --  2.9  HGB 12.1 11.7*  HCT 36.1 34.7*  MCV 98.7 94.8  PLT 104* 108*   Cardiac Enzymes:  Recent Labs Lab 05/17/15 1736  TROPONINI <0.03    BNP (last 3 results)  Recent Labs  01/10/15 2300  BNP 62.5    ProBNP (last 3 results) No results for input(s): PROBNP in the last 8760 hours.  CBG:  Recent Labs Lab 05/18/15 2039  GLUCAP 194*    Radiological Exams on Admission: Dg Chest 2 View  05/18/2015   CLINICAL DATA:  Epigastric pain, nausea and vomiting for 3 days.  EXAM: CHEST  2 VIEW  COMPARISON:  PA and lateral chest 05/17/2015.  FINDINGS: The lungs are clear. Heart size is upper normal. No pneumothorax or pleural effusion. Scoliosis noted  IMPRESSION: No acute disease.   Electronically Signed   By: Inge Rise M.D.   On: 05/18/2015 21:14   Dg Chest 2 View  05/17/2015   CLINICAL DATA:  Epigastric chest pain nausea and vomiting for 3 days  EXAM: CHEST  2 VIEW  COMPARISON:  04/05/2015  FINDINGS: Scoliosis thoracolumbar spine. Heart size and vascular pattern normal. Lungs clear. No pleural effusions.  IMPRESSION: No acute finding   Electronically Signed   By: Skipper Cliche M.D.   On: 05/17/2015 18:16   Ct Head Wo Contrast  05/18/2015   CLINICAL DATA:  59 year old female with fall and headache  EXAM: CT HEAD WITHOUT CONTRAST  TECHNIQUE: Contiguous axial images were obtained from the base of the skull through the vertex without intravenous contrast.  COMPARISON:  CT dated 04/13/2015  FINDINGS: The ventricles and sulci are appropriate in size for the patient's age. There is no intracranial hemorrhage. No mass effect or midline shift identified. The gray-white matter differentiation is preserved. There is no extra-axial fluid collection.  The visualized paranasal sinuses and mastoid air cells are well aerated. The calvarium is intact.  IMPRESSION: No acute intracranial pathology.   Electronically Signed   By: Anner Crete M.D.   On: 05/18/2015 22:05  Ct Abdomen Pelvis W Contrast  05/17/2015   CLINICAL DATA:  Pt c/o generalized abd pain with diarrhea x3 days  EXAM: CT ABDOMEN AND PELVIS WITH CONTRAST  TECHNIQUE: Multidetector CT imaging of the abdomen and pelvis was performed using the standard protocol following bolus administration of intravenous contrast.  CONTRAST:  125m OMNIPAQUE IOHEXOL 300 MG/ML  SOLN  COMPARISON:  04/03/2015  FINDINGS: Lower chest:  Clear  Hepatobiliary: Gallbladder surgically absent  Pancreas: Normal  Spleen: Normal  Adrenals/Urinary Tract: Mild dilatation of both ureters with no ureteral stones, likely due to mild bladder distention. 735mobstructing stone or pole right kidney  Stomach/Bowel: Proximal jejunum show significant wall thickening involving several loops. She mild intussusception identified involving left upper quadrant jejunum. Mild dilatation both proximal and distal to this. Contrast seen well beyond this area into more distal small bowel.  Vascular/Lymphatic: No acute findings. Mild calcification aortoiliac vessels.  Reproductive: Not identified  Other: Small volume ascites  Musculoskeletal: No acute musculoskeletal findings  IMPRESSION: Significant inflammation involving multiple loops of proximal jejunum with mild nonobstructing intussusception   Electronically Signed   By: RaSkipper Cliche.D.   On: 05/17/2015 19:50    EKG: Independently reviewed. Sinus bradycardia with heart rate around 43 bpm with QTC of 507 ms.  Assessment/Plan Principal Problem:   Syncope Active Problems:   Chronic pain syndrome   Abdominal pain   Chronic atrial fibrillation   Diabetes mellitus type 2, controlled   Diarrhea   Chronic anemia   Thrombocytopenia   ARF (acute renal failure)   Bradycardia   1. Syncope - most likely a combination of bradycardia, dehydration and pain. At this time we will continue with hydration and hold off patient's Cardizem beta blocker. Patient's clonidine may also need to be held if patient is  persistently bradycardic. Closely follow metabolic panel as patient also has prolonged QT. We'll cycle cardiac markers and check 2-D echo. Since patient also had incontinence of urine and have ordered an EEG. Patient had an unremarkable MRI brain in June 2016. 2. Abdominal pain with diarrhea with recent CT scan showing jejunal inflammation with nonobstructing intussusception - check stool for C. difficile and other GI pathogens. Check KUB. Patient has been placed on Flagyl. Continue hydration. If pain persists may need GI/surgry consult. [facility]. Patient is on Protonix. 3. Acute renal failure probably from dehydration - gently hydrate and recheck metabolic panel. 4. Paroxysmal atrial fibrillation - chads 2 vasc score is 4. Patient is on Apixaban.Patient is presently bradycardic. See #1 with regarding to holding rate limiting medications. 5. Hypertension - if patient remains bradycardic may have to hold clonidine. Holding off lisinopril and placing patient on when necessary IV hydralazine for now due to dehydration. 6. Chronic pain syndrome - on large doses of morphine. 7. Chronic anemia and thrombocytopenia - follow CBC. 8. Diabetes mellitus type 2 - on Januvia and hold metformin while inpatient.  I have reviewed patient's old charts are labs. Personally reviewed patient's chest x-ray and EKG.   DVT Prophylaxis - Apixaban. Code Status: Full code.  Family Communication: Discussed with patient.  Disposition Plan: Admit to inpatient.    Jevon Shells N. [facility] Pager [phone] If 7PM-7AM, please contact night-coverage [email or web address] Password [email or web address] 05/19/2015, 1:15 AM

## 2015-05-19 NOTE — Progress Notes (Signed)
Utilization review completed. Yacob Wilkerson, RN, BSN. 

## 2015-05-19 NOTE — Progress Notes (Signed)
[facility] TEAM 1 - Stepdown/ICU TEAM Progress Note  [name]:[id number] DOB: March 12, 1956 DOA: 05/18/2015 PCP: Alvester Chou, NP  Admit HPI / Brief Narrative: KARIANNA [name] is a 59 y.o. WF PMHx Recently Dx A-Fib on Apixaban, HTN, Hypothyroidism, Syncope, Anxiety, HTN, Hiatal Hernia, Uterine Cancer, Chronic Pain Syndrome + large amount narcotic use.  Brought to the ER after patient had an episode of syncope. As per the husband who provided the history to the ER physician patient is walking the hall when patient suddenly lost consciousness for about 2-3 minutes. Patient did not have any seizure-like activity but did have incontinence of urine. Patient states over the last 2-3 days patient has been having recurrent episodes of diarrhea with epigastric pain. Patient come to the ER yesterday and CT abdomen showed jejunal inflammation and was discharged home on Cipro and Flagyl. Patient's pain is mostly in the epigastric area. In the ER patient was found to have sinus bradycardia with heart rate around 43 bpm with prolonged QTC of 507 ms. Patient was not hypotensive. Creatinine was mildly elevated. Patient denies any vomiting. Patient was able to take her medications. TSH is mildly elevated around 5. Patient otherwise denies any chest pain or shortness of breath. Patient was admitted at [facility] in July for abdominal pain and at that time had endoscopy which showed diffuse gastritis. During cementation patient also was in A. fib with RVR during which patient was started on amiodarone and Cardizem and metoprolol. Patient at this time is being given IV fluids and admitted for further management for syncope. Patient states over the last few days patient gets dizzy on standing.   HPI/Subjective: 9/8  A/O 4, NAD, follows all commands.  Assessment/Plan: Syncope and collapse -Most likely multifactorial to include large amount of narcotic use, orthostatic hypotension?, Arrhythmia?,  Hypothyroidism?, CVA vs TIA? -Decrease MS Contin to 45 mg  BID -Decrease Vicodin 5-3 25 to TID -Hold all BP medication except for amiodarone for atrial fibrillation -Bolus 1 L normal saline -Troponin 3 negative -TSH elevated 5.0, obtain free T4 -EEG pending -Echocardiogram pending -MRI brain pending; note MRI brain in June unremarkable -Orthostatic vitals in a.m.  Prolonged QT interval -Closely follow metabolic panel as patient also has prolonged QT   Paroxysmal atrial fibrillation  - chads 2 vasc score is 4. Patient is on Apixaban. -Hold all nodal blocking agents   HTN/Hypotension -Currently patient hypotensive -Bolus 1 L normal saline; then resume normal saline at 191m/hr  Hypokalemia -Potassium goal>4 -Potassium IV 10 mEq 4  Hypomagnesemia -Magnesium goal> 2 -Magnesium IV 3 gm  Abdominal pain with diarrhea/Nonobstructing intussusception/colitis  -Elicited pain which would be consistent with hiatal hernia however abdominal/pelvis CT shows  jejunal inflammation with nonobstructing intussusception -NPO  -check stool for C. difficile and other GI pathogens.  -Continue Flagyl + ciprofloxacin  -Maintain patient nothing by mouth -If pain returns overnight or increase in W BCs will consult GI/surgery in A.m. -KUB; Non obstructed bowel gas - [facility].  -Continue Patient is on Protonix.  Acute renal failure probably from dehydration  - gently hydrate and recheck metabolic panel.  Chronic pain syndrome/narcotic abuse  - on large doses of morphine which most likely is also contributed to patient's bradycardia, syncope and collapse. -See syncope and collapse  Chronic anemia and thrombocytopenia  - follow CBC.  Diabetes mellitus type 2  - Increase to  moderate SSI  -Hold all oral hypoglycemic  -A1c pending  -Lipid panel pending     Code Status:  FULL Family Communication: no family present at time of exam Disposition Plan: Completion of syncope and collapse workup    Consultants:   Procedure/Significant Events: 9/6 CT abdomen and pelvis with contrast;Significant inflammation involving multiple loops of proximal jejunum with mild nonobstructing intussusception   Culture 9/8 MRSA by PCR negative 9/8 stool pending 9/8 blood pending 9/8 urine pending  Antibiotics: Flagyl 9/8>>  DVT prophylaxis: Eliquis   Devices    LINES / TUBES:      Continuous Infusions: . sodium chloride 100 mL/hr at 05/19/15 0353    Objective: VITAL SIGNS: Temp: 98.9 F (37.2 C) (09/08 1607) Temp Source: Oral (09/08 1607) BP: 106/68 mmHg (09/08 1800) Pulse Rate: 85 (09/08 1800) SPO2; FIO2:   Intake/Output Summary (Last 24 hours) at 05/19/15 1947 Last data filed at 05/19/15 1500  Gross per 24 hour  Intake   2020 ml  Output    350 ml  Net   1670 ml     Exam: General: A/O 4, NAD, follows all commands, No acute respiratory distress Eyes: Negative headache, negative scleral hemorrhage ENT: Negative Runny nose, negative ear pain, negative gingival bleeding, Neck:  Negative scars, masses, torticollis, lymphadenopathy, JVD Lungs: Clear to auscultation bilaterally without wheezes or crackles Cardiovascular: Regular rate and rhythm without murmur gallop or rub normal S1 and S2 Abdomen: Positive epigastric abdominal pain, negative dysphagia, nondistended, positive soft, bowel sounds, no rebound, no ascites, no appreciable mass Extremities: No significant cyanosis, clubbing, or edema bilateral lower extremities Psychiatric:  Negative depression, negative anxiety, negative fatigue, negative mania Neurologic:  Cranial nerves II through XII intact, tongue/uvula midline, all extremities muscle strength 5/5, sensation intact throughout,negative dysarthria, negative expressive aphasia, negative receptive aphasia.   Data Reviewed: Basic  Metabolic Panel:  Recent Labs Lab 05/17/15 1736 05/18/15 2025 05/19/15 0640  NA 128* 135 135  K 3.2* 3.6 3.1*  CL 101 104 104  CO2 22 24 22   GLUCOSE 309* 227* 266*  BUN 19 12 12   CREATININE 0.96 1.08* 1.02*  CALCIUM 7.8* 8.5* 8.3*  MG  --   --  1.6*   Liver Function Tests:  Recent Labs Lab 05/17/15 1736 05/18/15 2025 05/19/15 0640  AST 34 29 34  ALT 24 27 28   ALKPHOS 71 73 71  BILITOT 1.1 1.1 1.0  PROT 5.5* 5.5* 5.3*  ALBUMIN 2.9* 3.0* 2.9*    Recent Labs Lab 05/17/15 1736 05/18/15 2025  LIPASE 48 47   No results for input(s): AMMONIA in the last 168 hours. CBC:  Recent Labs Lab 05/17/15 1736 05/18/15 2025 05/19/15 0640  WBC 5.6 4.6 3.7*  NEUTROABS  --  2.9  --   HGB 12.1 11.7* 11.5*  HCT 36.1 34.7* 34.1*  MCV 98.7 94.8 95.3  PLT 104* 108* 92*   Cardiac Enzymes:  Recent Labs Lab 05/17/15 1736 05/19/15 0317 05/19/15 0640 05/19/15 1250  TROPONINI <0.03 <0.03 <0.03 <0.03   BNP (last 3 results)  Recent Labs  01/10/15 2300  BNP 62.5    ProBNP (last 3 results) No results for input(s): PROBNP in the last 8760 hours.  CBG:  Recent Labs Lab 05/18/15 2039 05/19/15 0756 05/19/15 1243 05/19/15 1708  GLUCAP 194* 220* 116* 207*    Recent Results (from the past 240 hour(s))  MRSA PCR Screening     Status: None   Collection Time: 05/19/15  1:00 AM  Result Value Ref  Range Status   MRSA by PCR NEGATIVE NEGATIVE Final    Comment:        The GeneXpert MRSA Assay (FDA approved for NASAL specimens only), is one component of a comprehensive MRSA colonization surveillance program. It is not intended to diagnose MRSA infection nor to guide or monitor treatment for MRSA infections.      Studies:  Recent x-ray studies have been reviewed in detail by the Attending Physician  Scheduled Meds:  Scheduled Meds: . amiodarone  400 mg Oral BID  . apixaban  5 mg Oral BID  . atorvastatin  40 mg Oral q1800  . ciprofloxacin  400 mg Intravenous  Q12H  . citalopram  20 mg Oral Daily  . feeding supplement (ENSURE ENLIVE)  237 mL Oral TID BM  . [START ON 05/20/2015] Influenza vac split quadrivalent PF  0.5 mL Intramuscular Tomorrow-1000  . insulin aspart  0-15 Units Subcutaneous 6 times per day  . magnesium sulfate 1 - 4 g bolus IVPB  3 g Intravenous Once  . metoCLOPramide  10 mg Oral TID AC & HS  . metronidazole  500 mg Intravenous 3 times per day  . morphine  45 mg Oral BID  . pantoprazole (PROTONIX) IV  40 mg Intravenous Q12H  . [START ON 05/20/2015] pneumococcal 23 valent vaccine  0.5 mL Intramuscular Tomorrow-1000  . potassium chloride  10 mEq Intravenous Q1 Hr x 4  . sodium chloride  1,000 mL Intravenous Once  . sucralfate  1 g Oral TID AC & HS  . zolpidem  5 mg Oral QHS    Time spent on care of this patient: 40 mins   Agastya Meister, Geraldo Docker , MD  [facility] Office  [phone] Pager - [phone]  On-Call/Text Page:      [email or web address]      password [email or web address]  If 7PM-7AM, please contact night-coverage [email or web address] Password [email or web address] 05/19/2015, 7:47 PM   LOS: 1 day   Care during the described time interval was provided by me .  I have reviewed this patient's available data, including medical history, events of note, physical examination, and all test results as part of my evaluation. I have personally reviewed and interpreted all radiology studies.   Dia Crawford, MD [phone] Pager

## 2015-05-19 NOTE — Care Management Note (Addendum)
Case Management Note  Patient Details  Name: [name] MRN: [id number] Date of Birth: Aug 30, 1956  Subjective/Objective:                 PTA from home with family admitted with syncope, ADLS independent.   Action/Plan: Return to home when medically stable. CM to f/u with d/c needs.  Expected Discharge Date:                  Expected Discharge Plan:  Home/Self Care  In-House Referral:     Discharge planning Services  CM Consult  Post Acute Care Choice:    Choice offered to:     DME Arranged:    DME Agency:     HH Arranged:    HH Agency:     Status of Service:  In process, will continue to follow  Medicare Important Message Given:    Date Medicare IM Given:    Medicare IM give by:    Date Additional Medicare IM Given:    Additional Medicare Important Message give by:     If discussed at [place] of Stay Meetings, dates discussed:    Additional Comments: [name] (Spouse)  [phone]  [address] [place], [place] [phone] 05/19/2015, 6:39 PM

## 2015-05-19 NOTE — Progress Notes (Signed)
Initial Nutrition Assessment  DOCUMENTATION CODES:   Non-severe (moderate) malnutrition in context of chronic illness  INTERVENTION:    Ensure Enlive po TID, each supplement provides 350 kcal and 20 grams of protein  NUTRITION DIAGNOSIS:   Malnutrition related to chronic illness as evidenced by energy intake < 75% for > or equal to 1 month, percent weight loss, mild depletion of muscle mass (10% weight loss within 3 months).  GOAL:   Patient will meet greater than or equal to 90% of their needs  MONITOR:   PO intake, Supplement acceptance, Labs, Weight trends  REASON FOR ASSESSMENT:   Malnutrition Screening Tool    ASSESSMENT:   59 y.o. female with history of recently diagnosed atrial fibrillation on Apixaban was brought to the ER after patient had an episode of syncope. Patient came to the ER yesterday and CT abdomen showed jejunal inflammation and was discharged home on Cipro and Flagyl.   Labs reviewed: potassium and magnesium are low.  Patient reports recent poor intake related to upper abdominal pain. She has lost 15 lbs over the past 3 months. She thinks most of this weight loss has occurred over the past few weeks to a month. She is currently hungry. Ate ~25% of her breakfast. She likes Ensure supplements, willing to drink 3 per day. Nutrition-Focused physical exam completed. Findings are mild-moderate fat depletion, mild-moderate muscle depletion, and no edema.   Diet Order:  Diet Carb Modified Fluid consistency:: Thin; Room service appropriate?: Yes  Skin:  Reviewed, no issues  Last BM:  PTA  Height:   Ht Readings from Last 1 Encounters:  05/17/15 5' 3"  (1.6 m)    Weight:   Wt Readings from Last 1 Encounters:  05/17/15 135 lb (61.236 kg)    Ideal Body Weight:  52.3 kg  BMI:  23.9 (WNL)  Estimated Nutritional Needs:   Kcal:  1600-1800  Protein:  75-90 gm  Fluid:  1.6-1.8 L  EDUCATION NEEDS:   No education needs identified at this  time  Molli Barrows, [place], [place], [place] Pager [phone] After Hours Pager [phone]

## 2015-05-19 NOTE — Progress Notes (Signed)
EEG completed, results pending. 

## 2015-05-19 NOTE — Procedures (Signed)
History: 59 yo F with LOC  Sedation: none  Technique: This is a 19 channel routine scalp EEG performed at the bedside with bipolar and monopolar montages arranged in accordance to the international 10/20 system of electrode placement. One channel was dedicated to EKG recording.    Background: The background consists of intermixed alpha and beta activities. There is a well defined posterior dominant rhythm of 10 Hz that attenuates with eye opening. Sleep is recorded with normal appearing structures.   Photic stimulation: Physiologic driving is present  EEG Abnormalities: None  Clinical Interpretation: This normal EEG is recorded in the waking and sleep state. There was no seizure or seizure predisposition recorded on this study.   Roland Rack, MD [facility] [phone]  If 7pm- 7am, please page neurology on call as listed in [place].

## 2015-05-20 ENCOUNTER — Inpatient Hospital Stay (HOSPITAL_COMMUNITY): Payer: Medicare HMO

## 2015-05-20 DIAGNOSIS — E876 Hypokalemia: Secondary | ICD-10-CM

## 2015-05-20 DIAGNOSIS — G8929 Other chronic pain: Secondary | ICD-10-CM | POA: Diagnosis present

## 2015-05-20 DIAGNOSIS — E038 Other specified hypothyroidism: Secondary | ICD-10-CM

## 2015-05-20 DIAGNOSIS — R1013 Epigastric pain: Secondary | ICD-10-CM

## 2015-05-20 DIAGNOSIS — K561 Intussusception: Secondary | ICD-10-CM

## 2015-05-20 DIAGNOSIS — D696 Thrombocytopenia, unspecified: Secondary | ICD-10-CM

## 2015-05-20 DIAGNOSIS — T462X1A Poisoning by other antidysrhythmic drugs, accidental (unintentional), initial encounter: Secondary | ICD-10-CM

## 2015-05-20 DIAGNOSIS — I951 Orthostatic hypotension: Secondary | ICD-10-CM | POA: Diagnosis not present

## 2015-05-20 DIAGNOSIS — I480 Paroxysmal atrial fibrillation: Secondary | ICD-10-CM | POA: Diagnosis not present

## 2015-05-20 DIAGNOSIS — K529 Noninfective gastroenteritis and colitis, unspecified: Secondary | ICD-10-CM

## 2015-05-20 DIAGNOSIS — I10 Essential (primary) hypertension: Secondary | ICD-10-CM | POA: Diagnosis not present

## 2015-05-20 DIAGNOSIS — F1120 Opioid dependence, uncomplicated: Secondary | ICD-10-CM | POA: Diagnosis not present

## 2015-05-20 DIAGNOSIS — N179 Acute kidney failure, unspecified: Secondary | ICD-10-CM | POA: Diagnosis not present

## 2015-05-20 DIAGNOSIS — I509 Heart failure, unspecified: Secondary | ICD-10-CM

## 2015-05-20 DIAGNOSIS — E1165 Type 2 diabetes mellitus with hyperglycemia: Secondary | ICD-10-CM | POA: Diagnosis not present

## 2015-05-20 DIAGNOSIS — E860 Dehydration: Secondary | ICD-10-CM | POA: Diagnosis not present

## 2015-05-20 DIAGNOSIS — E032 Hypothyroidism due to medicaments and other exogenous substances: Secondary | ICD-10-CM | POA: Diagnosis present

## 2015-05-20 LAB — CBC WITH DIFFERENTIAL/PLATELET
BASOS ABS: 0 10*3/uL (ref 0.0–0.1)
BASOS PCT: 0 % (ref 0–1)
EOS ABS: 0.1 10*3/uL (ref 0.0–0.7)
Eosinophils Relative: 2 % (ref 0–5)
HCT: 34 % — ABNORMAL LOW (ref 36.0–46.0)
HEMOGLOBIN: 11.3 g/dL — AB (ref 12.0–15.0)
Lymphocytes Relative: 25 % (ref 12–46)
Lymphs Abs: 1.4 10*3/uL (ref 0.7–4.0)
MCH: 32.5 pg (ref 26.0–34.0)
MCHC: 33.2 g/dL (ref 30.0–36.0)
MCV: 97.7 fL (ref 78.0–100.0)
MONOS PCT: 8 % (ref 3–12)
Monocytes Absolute: 0.4 10*3/uL (ref 0.1–1.0)
NEUTROS ABS: 3.7 10*3/uL (ref 1.7–7.7)
NEUTROS PCT: 66 % (ref 43–77)
Platelets: 112 10*3/uL — ABNORMAL LOW (ref 150–400)
RBC: 3.48 MIL/uL — ABNORMAL LOW (ref 3.87–5.11)
RDW: 13.3 % (ref 11.5–15.5)
WBC: 5.6 10*3/uL (ref 4.0–10.5)

## 2015-05-20 LAB — COMPREHENSIVE METABOLIC PANEL
ALK PHOS: 73 U/L (ref 38–126)
ALT: 30 U/L (ref 14–54)
ANION GAP: 7 (ref 5–15)
AST: 32 U/L (ref 15–41)
Albumin: 3 g/dL — ABNORMAL LOW (ref 3.5–5.0)
BILIRUBIN TOTAL: 0.6 mg/dL (ref 0.3–1.2)
BUN: 11 mg/dL (ref 6–20)
CALCIUM: 8.3 mg/dL — AB (ref 8.9–10.3)
CO2: 22 mmol/L (ref 22–32)
CREATININE: 1.11 mg/dL — AB (ref 0.44–1.00)
Chloride: 110 mmol/L (ref 101–111)
GFR, EST NON AFRICAN AMERICAN: 52 mL/min — AB (ref >60)
Glucose, Bld: 128 mg/dL — ABNORMAL HIGH (ref 65–99)
Potassium: 4 mmol/L (ref 3.5–5.1)
Sodium: 139 mmol/L (ref 135–145)
TOTAL PROTEIN: 5.3 g/dL — AB (ref 6.5–8.1)

## 2015-05-20 LAB — GLUCOSE, CAPILLARY
GLUCOSE-CAPILLARY: 110 mg/dL — AB (ref 65–99)
GLUCOSE-CAPILLARY: 118 mg/dL — AB (ref 65–99)
GLUCOSE-CAPILLARY: 135 mg/dL — AB (ref 65–99)
GLUCOSE-CAPILLARY: 165 mg/dL — AB (ref 65–99)
Glucose-Capillary: 157 mg/dL — ABNORMAL HIGH (ref 65–99)
Glucose-Capillary: 274 mg/dL — ABNORMAL HIGH (ref 65–99)

## 2015-05-20 LAB — MAGNESIUM: MAGNESIUM: 2.3 mg/dL (ref 1.7–2.4)

## 2015-05-20 LAB — HEMOGLOBIN A1C
Hgb A1c MFr Bld: 7.1 % — ABNORMAL HIGH (ref 4.8–5.6)
MEAN PLASMA GLUCOSE: 157 mg/dL

## 2015-05-20 MED ORDER — POTASSIUM CHLORIDE 10 MEQ/100ML IV SOLN
10.0000 meq | INTRAVENOUS | Status: AC
  Administered 2015-05-20 (×2): 10 meq via INTRAVENOUS
  Filled 2015-05-20 (×2): qty 100

## 2015-05-20 MED ORDER — DEXTROSE-NACL 5-0.9 % IV SOLN
INTRAVENOUS | Status: DC
  Administered 2015-05-20 – 2015-05-21 (×2): via INTRAVENOUS

## 2015-05-20 MED ORDER — LEVOTHYROXINE SODIUM 100 MCG IV SOLR
12.5000 ug | Freq: Every day | INTRAVENOUS | Status: DC
  Administered 2015-05-20 – 2015-05-21 (×2): 12.5 ug via INTRAVENOUS
  Filled 2015-05-20 (×2): qty 5

## 2015-05-20 NOTE — Progress Notes (Signed)
Diabetic diet initiated. Pt provided with sugar free applesauce and diet Sprite.  Pt feeding self without difficulty.

## 2015-05-20 NOTE — Evaluation (Signed)
Physical Therapy Evaluation Patient Details Name: [name] MRN: [id number] DOB: 11/02/1955 Today's Date: 05/20/2015   History of Present Illness  59 y/o WF admitted after having episode of syncope while ambulating at home.  PMHx Recently Dx A-Fib on Apixaban, HTN, Hypothyroidism, Syncope, Anxiety, HTN, Hiatal Hernia, Uterine Cancer, Chronic Pain Syndrome + large amount narcotic use.  Clinical Impression  Pt admitted with above diagnosis. Pt currently with functional limitations due to the deficits listed below (see PT Problem List).  Pt will benefit from skilled PT to increase their independence and safety with mobility to allow discharge to the venue listed below.  Pt with decreased activity tolerance at eval and limited by fatigue with transfer from bed > chair.  Pt awake, but foggy at times.  At this time recommend HHPT due to 4-5 falls at home in the last 6 months.    Follow Up Recommendations Home health PT    Equipment Recommendations  None recommended by PT    Recommendations for Other Services       Precautions / Restrictions Precautions Precautions: Fall Precaution Comments: 4-5 falls in last 6 months Restrictions Weight Bearing Restrictions: No      Mobility  Bed Mobility Overal bed mobility: Needs Assistance Bed Mobility: Supine to Sit     Supine to sit: Supervision     General bed mobility comments: Cues for lines  Transfers Overall transfer level: Needs assistance Equipment used: 1 person hand held assist Transfers: Sit to/from Stand;Stand Pivot Transfers Sit to Stand: Min assist Stand pivot transfers: Min assist       General transfer comment: Pt able to take several small steps to chair with HHA.  Pt very fatigued with transfer.  Ambulation/Gait Ambulation/Gait assistance: Min assist Ambulation Distance (Feet): 2 Feet Assistive device: 1 person hand held assist       General Gait Details: Steps from bed > chair only.  Pt fatigued quickly  with minimal mobility.  Stairs            Wheelchair Mobility    Modified Rankin (Stroke Patients Only)       Balance Overall balance assessment: Needs assistance   Sitting balance-Leahy Scale: Good       Standing balance-Leahy Scale: Fair                               Pertinent Vitals/Pain Pain Assessment: 0-10 Pain Score: 8  Pain Location: upper abdomen, chest and back Pain Descriptors / Indicators: Sharp Pain Intervention(s): Repositioned;Monitored during session    Home Living Family/patient expects to be discharged to:: Private residence Living Arrangements: Spouse/significant other;Children Available Help at Discharge: Available 24 hours/day;Family Type of Home: Mobile home Home Access: Ramped entrance     Home Layout: One level Home Equipment: Environmental consultant - 2 wheels;Cane - single point      Prior Function Level of Independence: Independent with assistive device(s)         Comments: Amb with cane in the house and RW for outside ambulation.     Hand Dominance   Dominant Hand: Right    Extremity/Trunk Assessment   Upper Extremity Assessment: Generalized weakness           Lower Extremity Assessment: Generalized weakness      Cervical / Trunk Assessment: Normal  Communication   Communication: No difficulties  Cognition Arousal/Alertness: Awake/alert Behavior During Therapy: WFL for tasks assessed/performed Overall Cognitive Status: Within Functional Limits for tasks  assessed       Memory: Decreased short-term memory              General Comments General comments (skin integrity, edema, etc.): Pt awake and alert, but kind of in a fog.    Exercises        Assessment/Plan    PT Assessment Patient needs continued PT services  PT Diagnosis Generalized weakness;Difficulty walking   PT Problem List Decreased strength;Decreased activity tolerance;Decreased balance;Decreased mobility  PT Treatment Interventions  Functional mobility training;Therapeutic activities;Therapeutic exercise;Gait training;Balance training   PT Goals (Current goals can be found in the Care Plan section) Acute Rehab PT Goals Patient Stated Goal: Pt agreeable to work with PT. PT Goal Formulation: With patient Time For Goal Achievement: 06/03/15 Potential to Achieve Goals: Good    Frequency Min 3X/week   Barriers to discharge        Co-evaluation               End of Session   Activity Tolerance: Patient limited by fatigue Patient left: in chair;with call bell/phone within reach;with chair alarm set Nurse Communication: Mobility status         Time: 9242-6834 PT Time Calculation (min) (ACUTE ONLY): 20 min   Charges:   PT Evaluation $Initial PT Evaluation Tier I: 1 Procedure     PT G Codes:        [name] 05/20/2015, 10:31 AM

## 2015-05-20 NOTE — Care Management Important Message (Signed)
Important Message  Patient Details  Name: [name] MRN: [id number] Date of Birth: 1956/08/06   Medicare Important Message Given:  [facility] notification given    Nathen May 05/20/2015, 11:50 AMImportant Message  Patient Details  Name: [name] MRN: [id number] Date of Birth: [birth year]   Medicare Important Message Given:  Yes-second notification given    Nathen May 05/20/2015, 11:50 AM

## 2015-05-20 NOTE — Progress Notes (Signed)
[place] TEAM 1 - Stepdown/ICU TEAM Progress Note  NOVALIE LEAMY OEU:[id number] DOB: 1956-05-13 DOA: 05/18/2015 PCP: Alvester Chou, NP  Admit HPI / Brief Narrative: [name] is a 59 y.o. WF PMHx Recently Dx A-Fib on Apixaban, HTN, Hypothyroidism, Syncope, Anxiety, HTN, Hiatal Hernia, Uterine Cancer, Chronic Pain Syndrome + large amount narcotic use.  Brought to the ER after patient had an episode of syncope. As per the husband who provided the history to the ER physician patient is walking the hall when patient suddenly lost consciousness for about 2-3 minutes. Patient did not have any seizure-like activity but did have incontinence of urine. Patient states over the last 2-3 days patient has been having recurrent episodes of diarrhea with epigastric pain. Patient come to the ER yesterday and CT abdomen showed jejunal inflammation and was discharged home on Cipro and Flagyl. Patient's pain is mostly in the epigastric area. In the ER patient was found to have sinus bradycardia with heart rate around 43 bpm with prolonged QTC of 507 ms. Patient was not hypotensive. Creatinine was mildly elevated. Patient denies any vomiting. Patient was able to take her medications. TSH is mildly elevated around 5. Patient otherwise denies any chest pain or shortness of breath. Patient was [facility] at [facility] in July for abdominal pain and at that time had endoscopy which showed diffuse gastritis. During cementation patient also was in A. fib with RVR during which patient was started on amiodarone and Cardizem and metoprolol. Patient at this time is being given IV fluids and admitted for further management for syncope. Patient states over the last few days patient gets dizzy on standing.   HPI/Subjective: 9/9  A/O 4, NAD, follows all commands. States this a.m. during the taking orthostatic vitals when she went from sitting to standing~15 seconds of dizziness (described as feeling like she was  going to pass out). Patient also states still having abdominal pain 8/10.  Assessment/Plan: Syncope and collapse -Most likely multifactorial to include large amount of narcotic use, positive orthostatic hypotension, Arrhythmia?, Subacute Hypothyroidism -Decrease MS Contin to 45 mg  BID (currently on hold) -Decrease Vicodin 5-3 25 to TID (currently on hold) -Hold all BP medication except for amiodarone for atrial fibrillation -Troponin 3 negative -TSH elevated 5.0, obtain free T4 WNL; findings consistent with subacute hypothyroidism -EEG; normal -Echocardiogram pending -MRI brain; negative acute findings  -Orthostatic vitals positive  Prolonged QT interval -Closely follow metabolic panel as patient also has prolonged QT   Paroxysmal atrial fibrillation  - chads 2 vasc score is 4. Patient is on Apixaban. -Hold all nodal blocking agents   HTN/Orthostatic Hypotension -Improved however patient positive orthostatic BP - D5-normal saline at 143m/hr  Hypokalemia -Potassium goal>4  Hypomagnesemia -Magnesium goal> 2  Subacute hypothyroidism -Since patient continues to have positive orthostatic vitals will start on Synthroid IV 12.5 g  Chronic epigastric Abdominal pain/Nonobstructing intussusception/colitis  -Elicited pain which would be consistent with hiatal hernia however abdominal/pelvis CT shows  jejunal inflammation with nonobstructing intussusception. In addition with patient's extremely high doses of narcotics may also be suffering with Narcotic Bowel Syndrome -NPO; await GI recommendations  -Continue Flagyl + ceftriaxone  -KUB; Non obstructed bowel gas - 7[facility] Stomach Biopsy from 7/25:Negative H. Pylori, dysplasia, or malignancy.  -Continue Patient on Protonix. -9/9 Pain continued overnight consulted GI   Acute renal failure probably from dehydration  - Continue gently  hydration .  Chronic  pain syndrom/Scoliosis thoracolumbar spine/Narcotic abuse  - on large doses of morphine which most likely is also contributing to patient's bradycardia, syncope and collapse. -See syncope and collapse  Chronic anemia and thrombocytopenia  - CBC. Stable  Diabetes mellitus type 2 uncontrolled - Increase to moderate SSI  -Hold all oral hypoglycemic  -9/8 hemoglobin A1c = 7.1  -Lipid panel; within ADA guidelines      Code Status: FULL Family Communication: no family present at time of exam Disposition Plan: Completion of syncope and collapse workup    Consultants: GI pending   Procedure/Significant Events: 7/25 gastric biopsy;Negative H. Pylori, dysplasia, or malignancy. 9/6 CT abdomen and pelvis with contrast; Significant inflammation involving multiple loops of proximal jejunum with mild nonobstructing intussusception 9/9 MRI brain; negative acute findings   Culture 9/8 MRSA by PCR negative 9/8 stool pending 9/8 blood pending 9/8 urine pending  Antibiotics: Flagyl 9/8>> Ceftriaxone 9/8>>   DVT prophylaxis: Eliquis   Devices    LINES / TUBES:      Continuous Infusions:    Objective: VITAL SIGNS: Temp: 98.2 F (36.8 C) (09/09 0751) Temp Source: Oral (09/09 0751) BP: 157/69 mmHg (09/09 0751) Pulse Rate: 83 (09/09 0751) SPO2; FIO2:   Intake/Output Summary (Last 24 hours) at 05/20/15 0102 Last data filed at 05/20/15 7253  Gross per 24 hour  Intake   3250 ml  Output   1100 ml  Net   2150 ml     Exam: General: A/O 4, NAD, follows all commands, No acute respiratory distress Eyes: Negative headache, negative scleral hemorrhage ENT: Negative Runny nose, negative ear pain, negative gingival bleeding, Neck:  Negative scars, masses, torticollis, lymphadenopathy, JVD Lungs: Clear to auscultation bilaterally without wheezes or crackles Cardiovascular: Regular rate and rhythm without murmur gallop or rub normal S1 and  S2 Abdomen: Positive epigastric abdominal pain, negative dysphagia, nondistended, positive soft, bowel sounds, no rebound, no ascites, no appreciable mass Extremities: No significant cyanosis, clubbing, or edema bilateral lower extremities Psychiatric:  Negative depression, negative anxiety, negative fatigue, negative mania Neurologic:  Cranial nerves II through XII intact, tongue/uvula midline, all extremities muscle strength 5/5, sensation intact throughout,negative dysarthria, negative expressive aphasia, negative receptive aphasia.   Data Reviewed: Basic Metabolic Panel:  Recent Labs Lab 05/17/15 1736 05/18/15 2025 05/19/15 0640  NA 128* 135 135  K 3.2* 3.6 3.1*  CL 101 104 104  CO2 22 24 22   GLUCOSE 309* 227* 266*  BUN 19 12 12   CREATININE 0.96 1.08* 1.02*  CALCIUM 7.8* 8.5* 8.3*  MG  --   --  1.6*   Liver Function Tests:  Recent Labs Lab 05/17/15 1736 05/18/15 2025 05/19/15 0640  AST 34 29 34  ALT 24 27 28   ALKPHOS 71 73 71  BILITOT 1.1 1.1 1.0  PROT 5.5* 5.5* 5.3*  ALBUMIN 2.9* 3.0* 2.9*    Recent Labs Lab 05/17/15 1736 05/18/15 2025  LIPASE 48 47   No results for input(s): AMMONIA in the last 168 hours. CBC:  Recent Labs Lab 05/17/15 1736 05/18/15 2025 05/19/15 0640  WBC 5.6 4.6 3.7*  NEUTROABS  --  2.9  --   HGB 12.1 11.7* 11.5*  HCT 36.1 34.7* 34.1*  MCV 98.7 94.8 95.3  PLT 104* 108* 92*   Cardiac Enzymes:  Recent Labs Lab 05/17/15 1736 05/19/15 0317 05/19/15 0640 05/19/15 1250  TROPONINI <0.03 <0.03 <0.03 <0.03   BNP (last 3 results)  Recent Labs  01/10/15 2300  BNP 62.5    ProBNP (last 3 results)  No results for input(s): PROBNP in the last 8760 hours.  CBG:  Recent Labs Lab 05/19/15 1243 05/19/15 1708 05/19/15 2059 05/20/15 0022 05/20/15 0500  GLUCAP 116* 207* 144* 118* 157*    Recent Results (from the past 240 hour(s))  MRSA PCR Screening     Status: None   Collection Time: 05/19/15  1:00 AM  Result Value  Ref Range Status   MRSA by PCR NEGATIVE NEGATIVE Final    Comment:        The GeneXpert MRSA Assay (FDA approved for NASAL specimens only), is one component of a comprehensive MRSA colonization surveillance program. It is not intended to diagnose MRSA infection nor to guide or monitor treatment for MRSA infections.      Studies:  Recent x-ray studies have been reviewed in detail by the Attending Physician  Scheduled Meds:  Scheduled Meds: . amiodarone  400 mg Oral BID  . apixaban  5 mg Oral BID  . atorvastatin  40 mg Oral q1800  . cefTRIAXone (ROCEPHIN)  IV  1 g Intravenous Q24H  . citalopram  20 mg Oral Daily  . feeding supplement (ENSURE ENLIVE)  237 mL Oral TID BM  . Influenza vac split quadrivalent PF  0.5 mL Intramuscular Tomorrow-1000  . insulin aspart  0-15 Units Subcutaneous 6 times per day  . metoCLOPramide  10 mg Oral TID AC & HS  . metronidazole  500 mg Intravenous 3 times per day  . morphine  45 mg Oral BID  . pantoprazole (PROTONIX) IV  40 mg Intravenous Q12H  . pneumococcal 23 valent vaccine  0.5 mL Intramuscular Tomorrow-1000  . sucralfate  1 g Oral TID AC & HS  . zolpidem  5 mg Oral QHS    Time spent on care of this patient: 40 mins   Arleatha Philipps, Geraldo Docker , MD  [facility] Office  [phone] Pager - [phone]  On-Call/Text Page:      [email or web address]      password [email or web address]  If 7PM-7AM, please contact night-coverage [email or web address] Password [email or web address] 05/20/2015, 8:38 AM   LOS: 2 days   Care during the described time interval was provided by me .  I have reviewed this patient's available data, including medical history, events of note, physical examination, and all test results as part of my evaluation. I have personally reviewed and interpreted all radiology studies.   Dia Crawford, MD [phone] Pager

## 2015-05-20 NOTE — Clinical Documentation Improvement (Signed)
Hospitalist  Can the diagnosis of altered mental status be further specified?   Encephalopathy - Alcoholic, Anoxic/Hypoxia, Drug Induced/Toxic (specify drug), Hepatic, Hypertensive, Hypoglycemic, Metabolic/Septic, Traumatic/post concussive, Wernicke, Other   Confusion  Delirium  Other  Clinically Undetermined  Document any associated diagnoses/conditions.   Supporting Information: AMS noted per 9/08 progress notes.   Please exercise your independent, professional judgment when responding. A specific answer is not anticipated or expected.   Thank You,  [place] [phone]

## 2015-05-20 NOTE — Consult Note (Signed)
[facility] Consult: 2:26 PM 05/20/2015  LOS: 2 days    Referring Provider: Dr Dia Crawford   Primary Care Physician:  Alvester Chou, NP Primary Gastroenterologist:  Dr. Verdie Shire in [place].     Reason for Consultation:  Chronic abdominal pain.    HPI: [name] is a 59 y.o. female.  On Eliquis history of recently diagnosed atrial fibrillation.   Long history of abdominal pain, nausea vomiting. Emptying study confirmed gastro-paresis.   04/2005 nuclear medicine gastric emptying study showed marketed emptying delay. 95% of activity remaining in the stomach at 2 hours. 04/2005 small bowel follow-through was normal. 08/2007 ultrasound abdomen for acute pancreatitis, nausea vomiting showed echogenic liver, limited visualization of pancreas and post cholecystectomy status. 06/2007 CT scan abdomen with contrast with normal liver, spleen, pancreas. Gallbladder absent. 09/2010 pathology from colon biopsies and upper intestinal biopsies show sigmoid tubulovillous adenoma without HGD.   Random colon biopsies unremarkable.  Chronic active inflammation suggestive of GE reflux  from GE junction biopsies.   10/11/2014 Pathology from random colon biopsy: All within normal limits.  Admission to [facility] in July with abdominal pain and underwent GI studies by Dr Candace Cruise. During that admission she was started on the anticoagulation for A. fib. 04/03/2015 CT scan showed cirrhosis and a large burden of stool.  nonobstructing renal stones. 04/04/2015 EGD. By Dr. Verdie Shire. For history of gastroparesis and current abdominal pain/N/V. M.D. found chronic gastritis.  Pathology showed oxyntic mucosa and and changes consistent with proton pump inhibitor effect. Biopsies negative for H. pylori, dysplasia, malignancy  Patient had been  treated with Cipro and Flagyl after presenting to the ER 05/17/2015 for epigastric pain, head ache and chest pain.  05/17/2015 Contrasted CT scan abdomen pelvis showed inflammation involving multiple loops of the proximal jejunum with mild, nonobstructing intussusception. She was somewhat obtunded during her stay in the emergency room. She developed syncope, loss of consciousness, orthostasis and was transferred to [place]. She was bradycardic CT and MRI studies of the brain showed nothing acute, just chronic small vessel ischemic disease.  She has persistent abdominal pain. Vital signs orthostatic and she feels dizzy all the time. A significant issue in her history is her chronic large doses of MS Contin and Vicodin for degenerative disc disease and back pain.  These are prescribed by her primary care physician. She is not enrolled in a pain clinic. 05/19/2015 single view abdomen with normal bowel gas pattern and contrast in the splenic flexure.  Dr. Sherral Hammers is wondering if we have any insight into the abdominal pain or ideas as to further investigation. Patient's epigastric pain is not worsened by by mouth intake. However when she is having abdominal pain she doesn't feel like eating and if this lasts for several days or weeks, she doesn't eat much and will lose weight. She moves her bowels 2-3 times a week. No bloody stools, no diarrhea.  She does vomit with some regularity maybe 2-3 times a month.      Past Medical History  Diagnosis Date  . Type  1 diabetes   . Hypothyroid   . Degenerative disk disease   . Stomach ulcer   . Diverticulitis   . Syncope 01/2015  . Anxiety   . GERD (gastroesophageal reflux disease)   . History of hiatal hernia   . Cancer     HX OF CANCER OF UTERUS   . TIA (transient ischemic attack) 02/2015  . Hypertension   . Pancreatitis   . PAF (paroxysmal atrial fibrillation)     a. new onset 03/2015 in setting of intractable N/V; b. on Eliquis 5 mg bid; c. CHADSVASc  at least 4 (DM, TIA x 2, female)    Past Surgical History  Procedure Laterality Date  . Hernia repair    . Abdominal hysterectomy    . Cholecystectomy    . Esophagogastroduodenoscopy N/A 04/04/2015    Procedure: ESOPHAGOGASTRODUODENOSCOPY (EGD);  Surgeon: Hulen Luster, MD;  Location: [facility] ENDOSCOPY;  Service: Endoscopy;  Laterality: N/A;    Prior to Admission medications   Medication Sig Start Date End Date Taking? Authorizing Provider  metoprolol succinate (TOPROL-XL) 50 MG 24 hr tablet Take 1 tablet (50 mg total) by mouth daily. 03/14/15  Yes Henreitta Leber, MD  zolpidem (AMBIEN) 5 MG tablet Take 1 tablet by mouth at bedtime.   Yes Historical Provider, MD  amiodarone (PACERONE) 400 MG tablet Take 1 tablet (400 mg total) by mouth 2 (two) times daily. 04/07/15   Bettey Costa, MD  apixaban (ELIQUIS) 5 MG TABS tablet Take 1 tablet (5 mg total) by mouth 2 (two) times daily. 04/07/15   Bettey Costa, MD  atorvastatin (LIPITOR) 40 MG tablet Take 1 tablet (40 mg total) by mouth daily at 6 PM. 02/17/15   Bettey Costa, MD  ciprofloxacin (CIPRO) 500 MG tablet Take 1 tablet (500 mg total) by mouth 2 (two) times daily. 05/17/15   Carrie Mew, MD  citalopram (CELEXA) 20 MG tablet Take 1 tablet by mouth daily. 03/11/15   Historical Provider, MD  cloNIDine (CATAPRES) 0.1 MG tablet Take 1 tablet (0.1 mg total) by mouth 3 (three) times daily as needed. 04/10/15 04/09/16  Carrie Mew, MD  collagenase (SANTYL) ointment Apply topically daily. 04/07/15   Bettey Costa, MD  dicyclomine (BENTYL) 10 MG capsule Take 1 capsule by mouth 4 (four) times daily. 03/11/15   Historical Provider, MD  diltiazem (CARDIZEM CD) 120 MG 24 hr capsule Take 1 capsule (120 mg total) by mouth daily. 04/07/15   Bettey Costa, MD  diphenhydrAMINE (BENADRYL) 25 mg capsule Take 2 capsules (50 mg total) by mouth every 6 (six) hours as needed. 05/17/15   Carrie Mew, MD  HYDROcodone-acetaminophen (NORCO/VICODIN) 5-325 MG per tablet Take 1 tablet by mouth  every 6 (six) hours as needed for moderate pain.    Historical Provider, MD  JANUVIA 100 MG tablet Take 100 mg by mouth daily. 12/15/14   Historical Provider, MD  lisinopril (PRINIVIL,ZESTRIL) 5 MG tablet Take 1 tablet by mouth daily. 03/15/15   Historical Provider, MD  loperamide (IMODIUM A-D) 2 MG tablet Take 2 tablets (4 mg total) by mouth 4 (four) times daily as needed for diarrhea or loose stools. Patient not taking: Reported on 04/13/2015 04/10/15   Carrie Mew, MD  metFORMIN (GLUCOPHAGE) 1000 MG tablet Take 1 tablet by mouth 2 (two) times daily. 03/11/15   Historical Provider, MD  metoCLOPramide (REGLAN) 10 MG tablet Take 1 tablet (10 mg total) by mouth 4 (four) times daily -  before meals and at bedtime. 05/17/15   Doren Custard  Joni Fears, MD  metroNIDAZOLE (FLAGYL) 500 MG tablet Take 1 tablet (500 mg total) by mouth 3 (three) times daily. 05/17/15   Carrie Mew, MD  morphine (MS CONTIN) 100 MG 12 hr tablet Take 100 mg by mouth 2 (two) times daily.     Historical Provider, MD  ondansetron (ZOFRAN ODT) 8 MG disintegrating tablet Take 1 tablet (8 mg total) by mouth every 8 (eight) hours as needed for nausea or vomiting. 04/10/15   Carrie Mew, MD  pantoprazole (PROTONIX) 40 MG tablet Take 1 tablet (40 mg total) by mouth 2 (two) times daily. 04/07/15   Bettey Costa, MD  sucralfate (CARAFATE) 1 G tablet Take 1 tablet (1 g total) by mouth 4 (four) times daily. 04/13/15 04/12/16  Loney Hering, MD  tiZANidine (ZANAFLEX) 4 MG tablet Take 1 tablet by mouth every 6 (six) hours as needed for muscle spasms.     Historical Provider, MD    Scheduled Meds: . amiodarone  400 mg Oral BID  . apixaban  5 mg Oral BID  . atorvastatin  40 mg Oral q1800  . cefTRIAXone (ROCEPHIN)  IV  1 g Intravenous Q24H  . citalopram  20 mg Oral Daily  . feeding supplement (ENSURE ENLIVE)  237 mL Oral TID BM  . Influenza vac split quadrivalent PF  0.5 mL Intramuscular Tomorrow-1000  . insulin aspart  0-15 Units Subcutaneous 6 times  per day  . levothyroxine  12.5 mcg Intravenous Daily  . metoCLOPramide  10 mg Oral TID AC & HS  . metronidazole  500 mg Intravenous 3 times per day  . morphine  45 mg Oral BID  . pantoprazole (PROTONIX) IV  40 mg Intravenous Q12H  . sucralfate  1 g Oral TID AC & HS  . zolpidem  5 mg Oral QHS   Infusions: . dextrose 5 % and 0.9% NaCl 100 mL/hr at 05/20/15 1347   PRN Meds: hydrALAZINE, HYDROcodone-acetaminophen, morphine injection, ondansetron   Allergies as of 05/18/2015 - Review Complete 05/18/2015  Allergen Reaction Noted  . Erythromycin base Other (See Comments)   . Rosiglitazone maleate Swelling   . Codeine sulfate Rash   . Tetanus-diphtheria toxoids td Rash and Other (See Comments)     Family History  Problem Relation Age of Onset  . Hypertension Mother   . CAD Sister     Social History   Social History  . Marital Status: Married    Spouse Name: N/A  . Number of Children: N/A  . Years of Education: N/A   Occupational History  . Not on file.   Social History Main Topics  . Smoking status: Never Smoker   . Smokeless tobacco: Never Used  . Alcohol Use: No  . Drug Use: No  . Sexual Activity: No   Other Topics Concern  . Not on file   Social History Narrative    REVIEW OF SYSTEMS: Constitutional:  Weight tends to fluctuate over several months time but up to 15 pounds. ENT:  No nose bleeds Pulm:  No breathing problems or cough. CV:  No palpitations, no LE edema.  GU:  No hematuria, no frequency GI:  No dysphagia. Heme:  No issues with excessive bleeding or bruising. No history of significant anemia.   Transfusions:  She recalls never having had transfusions. Neuro:  No headaches, no peripheral tingling or numbness Derm:  No itching, no rash or sores.  Endocrine:  No sweats or chills.  No polyuria or dysuria Immunization:  Did not inquire. Travel:  None beyond local counties in last few months.    PHYSICAL EXAM: Vital signs in last 24 hours: Filed  Vitals:   05/20/15 1200  BP:   Pulse:   Temp: 98.6 F (37 C)  Resp:    Wt Readings from Last 3 Encounters:  05/17/15 135 lb (61.236 kg)  04/13/15 140 lb (63.504 kg)  04/10/15 145 lb (65.772 kg)    General: Well-appearing, comfortable WF. Head:  No asymmetry no facial edema.  Eyes:  No scleral icterus, no conjunctival pallor Ears:  Not hard of hearing  Nose:  No congestion or discharge Mouth:  Edentulous with moist, pink oral mucosa. Neck:  No JVD, no masses, no TMG. Lungs:  Clear bilaterally. No cough, no labored breathing Heart: RRR. No MRG. S1/S2 audible. Abdomen:  Soft, not distended. Bowel sounds active. No hepatosplenomegaly. Some tenderness without guarding or rebound in the right mid abdomen..   Rectal: Deferred   Musc/Skeltl: No joint erythema, contracture deformities, swelling Extremities:  No CCE.  Neurologic:  Oriented times self and place. She got the year wrong. Skin:  No telangiectasia no rash, no sores. Tattoos:  None Nodes:  No Cervical adenopathy.   Psych:  Pleasant, relaxed. Cooperative.  Intake/Output from previous day: 09/08 0701 - 09/09 0700 In: 3250 [P.O.:600; I.V.:800; IV Piggyback:1850] Out: 700 [Urine:700] Intake/Output this shift: Total I/O In: 0  Out: 700 [Urine:700]  LAB RESULTS:  Recent Labs  05/18/15 2025 05/19/15 0640 05/20/15 0945  WBC 4.6 3.7* 5.6  HGB 11.7* 11.5* 11.3*  HCT 34.7* 34.1* 34.0*  PLT 108* 92* 112*   BMET Lab Results  Component Value Date   NA 139 05/20/2015   NA 135 05/19/2015   NA 135 05/18/2015   K 4.0 05/20/2015   K 3.1* 05/19/2015   K 3.6 05/18/2015   CL 110 05/20/2015   CL 104 05/19/2015   CL 104 05/18/2015   CO2 22 05/20/2015   CO2 22 05/19/2015   CO2 24 05/18/2015   GLUCOSE 128* 05/20/2015   GLUCOSE 266* 05/19/2015   GLUCOSE 227* 05/18/2015   BUN 11 05/20/2015   BUN 12 05/19/2015   BUN 12 05/18/2015   CREATININE 1.11* 05/20/2015   CREATININE 1.02* 05/19/2015   CREATININE 1.08* 05/18/2015    CALCIUM 8.3* 05/20/2015   CALCIUM 8.3* 05/19/2015   CALCIUM 8.5* 05/18/2015   LFT  Recent Labs  05/17/15 1736 05/18/15 2025 05/19/15 0640 05/20/15 0945  PROT 5.5* 5.5* 5.3* 5.3*  ALBUMIN 2.9* 3.0* 2.9* 3.0*  AST 34 29 34 32  ALT 24 27 28 30   ALKPHOS 71 73 71 73  BILITOT 1.1 1.1 1.0 0.6  BILIDIR 0.3  --   --   --   IBILI 0.8  --   --   --    PT/INR Lab Results  Component Value Date   INR 1.32 05/17/2015   INR 1.27 04/05/2015   INR 1.2 10/08/2014   Hepatitis Panel No results for input(s): HEPBSAG, HCVAB, HEPAIGM, HEPBIGM in the last 72 hours. C-Diff No components found for: CDIFF Lipase     Component Value Date/Time   LIPASE 47 05/18/2015 2025    Drugs of Abuse     Component Value Date/Time   LABOPIA POSITIVE* 11/02/2007 0055   COCAINSCRNUR NONE DETECTED 11/02/2007 0055   LABBENZ NONE DETECTED 11/02/2007 0055   AMPHETMU NONE DETECTED 11/02/2007 0055   THCU NONE DETECTED 11/02/2007 0055   LABBARB  11/02/2007 0055    NONE DETECTED  DRUG SCREEN FOR MEDICAL PURPOSES ONLY.  IF CONFIRMATION IS NEEDED FOR ANY PURPOSE, NOTIFY LAB WITHIN 5 DAYS.     RADIOLOGY STUDIES: Dg Chest 2 View  05/18/2015   CLINICAL DATA:  Epigastric pain, nausea and vomiting for 3 days.  EXAM: CHEST  2 VIEW  COMPARISON:  PA and lateral chest 05/17/2015.  FINDINGS: The lungs are clear. Heart size is upper normal. No pneumothorax or pleural effusion. Scoliosis noted  IMPRESSION: No acute disease.   Electronically Signed   By: Inge Rise M.D.   On: 05/18/2015 21:14   Dg Abd 1 View  05/19/2015   CLINICAL DATA:  59 year old female with abdominal pain and diarrhea. Inflamed small bowel on recent CT Abdomen and Pelvis. Initial encounter.  EXAM: ABDOMEN - 1 VIEW  COMPARISON:  CT Abdomen and Pelvis 05/17/2015, and earlier  FINDINGS: Portable AP supine view at 0749 hours. Oral contrast administered on 05/17/2015 has reached the splenic flexure. Non obstructed bowel gas pattern. Abdominal and  pelvic visceral contours are stable and within normal limits. Stable visualized osseous structures. No definite pneumoperitoneum on this supine view.  IMPRESSION: Normal bowel gas pattern. Oral contrast has reached the splenic flexure.   Electronically Signed   By: Genevie Ann M.D.   On: 05/19/2015 07:57   Ct Head Wo Contrast  05/18/2015   CLINICAL DATA:  59 year old female with fall and headache  EXAM: CT HEAD WITHOUT CONTRAST  TECHNIQUE: Contiguous axial images were obtained from the base of the skull through the vertex without intravenous contrast.  COMPARISON:  CT dated 04/13/2015  FINDINGS: The ventricles and sulci are appropriate in size for the patient's age. There is no intracranial hemorrhage. No mass effect or midline shift identified. The gray-white matter differentiation is preserved. There is no extra-axial fluid collection.  The visualized paranasal sinuses and mastoid air cells are well aerated. The calvarium is intact.  IMPRESSION: No acute intracranial pathology.   Electronically Signed   By: Anner Crete M.D.   On: 05/18/2015 22:05   Mr Brain Wo Contrast  05/20/2015   CLINICAL DATA:  Syncopal episode, lost consciousness per 2-3 minutes while walking in house. Urinary incontinence. Recent diagnosis of atrial fibrillation. History of diabetes, cancer, hypertension.  EXAM: MRI HEAD WITHOUT CONTRAST  TECHNIQUE: Multiplanar, multiecho pulse sequences of the brain and surrounding structures were obtained without intravenous contrast.  COMPARISON:  CT head May 18, 2015 and MRI of the brain February 16, 2015  FINDINGS: The ventricles and sulci are normal for patient's age. No suspicious parenchymal signal, mass lesions, mass effect. No reduced diffusion to suggest acute ischemia. No susceptibility artifact to suggest hemorrhage. Scattered subcentimeter supratentorial white matter T2 hyperintensities.  No abnormal extra-axial fluid collections. No extra-axial masses though, contrast enhanced sequences  would be more sensitive. Normal major intracranial vascular flow voids seen at the skull base. Bilateral hippocampi demonstrate normal size, morphology and signal characteristics.  Ocular globes and orbital contents are unremarkable though not tailored for evaluation. No abnormal sellar expansion. Small bilateral mastoid effusions. Paranasal sinuses are well-aerated. No suspicious calvarial bone marrow signal. No abnormal sellar expansion. Craniocervical junction maintained. Patient is edentulous.  IMPRESSION: No acute intracranial process, specifically no acute ischemia.  Similar mild chronic small vessel ischemic disease.   Electronically Signed   By: Elon Alas M.D.   On: 05/20/2015 02:35    ENDOSCOPIC STUDIES: 04/04/2015 EGD. By Dr. Verdie Shire. For history of gastroparesis and current abdominal pain/N/V. M.D. found chronic gastritis.  Pathology showed oxyntic  mucosa and and changes consistent with proton pump inhibitor effect. Biopsies negative for H. pylori, dysplasia, malignancy  09/2010 pathology from colon biopsies and upper intestinal biopsies show sigmoid tubulovillous adenoma without HGD.   Random colon biopsies unremarkable.  Chronic active inflammation suggestive of GE reflux  from GE junction biopsies.   10/11/2014 Pathology from random colon biopsy: All within normal limits. Note unable to find actual endoscopy reports other than the upper endoscopy of July of this year but did find extensive pathology reports.  IMPRESSION:   *  Chronic epigastric abd pain.  Extensive previous workup. Gastroparesis confirmed by emptying study years ago. Chronic gastritis on EGD late July 2016.  Chronic GI meds include Reglan, PID Protonix, 4 times a day Carafate A CT scan 3 days ago showed possible intussusception but follow-up KUB is unremarkable.  the hospitalist has her on Rocephin and metronidazole IV. However her white cell count was never elevated  *  Changes of cirrhosis on CT scan 04/03/15.  Other than low albumen, her LFTs are currently normal. She is not coagulopathic with near normal PT and normal INR. Platelets as low as 92.. No ascites seen on July 4 September's CT scan.  *  Started on anticoagulation eliquis in July 2016 for atrial fibrillation.  *  Narcotics addiction. This is likely cause of recent altered mental status and syncope.Marland Kitchen Hospitalist is weaning her narcotics slowly.    PLAN:      *  Patient asking to eat. I discontinued the nothing by mouth and started her on a carb modified diet. *  Continue twice a day Protonix, Carafate, metoclopramide. *  Question need for ongoing antibiotic-coated   Azucena Freed  05/20/2015, 2:26 PM Pager: [phone]     ________________________________________________________________________  Velora Heckler GI MD note:  I personally examined the patient, reviewed the data and agree with the assessment and plan described above.  She has had extensive testing.  No need for further GI testing this admission. She should be treated symptomatically and follow up with her primary GI, Dr. Candace Cruise.   Owens Loffler, MD [facility] Pager [phone]

## 2015-05-20 NOTE — Progress Notes (Signed)
  Echocardiogram 2D Echocardiogram has been performed.  Jennette Dubin 05/20/2015, 4:35 PM

## 2015-05-20 NOTE — Progress Notes (Signed)
Comments:  Received call from pharmacy regarding concern that pt w/ prolonged QTc is receiving both Cipro and Amiodarone both of which are QTc prolonging medications. Request consideration to change Cipro to another antibiotic. Will d/c Cipro and start Rocephin. Discussed w/ Dr Hal Hope who is in agreement w/ plan.   Jeryl Columbia, NP-C [facility] Pager [phone]

## 2015-05-21 DIAGNOSIS — G894 Chronic pain syndrome: Secondary | ICD-10-CM

## 2015-05-21 DIAGNOSIS — I9589 Other hypotension: Secondary | ICD-10-CM

## 2015-05-21 DIAGNOSIS — F1110 Opioid abuse, uncomplicated: Secondary | ICD-10-CM

## 2015-05-21 DIAGNOSIS — K561 Intussusception: Secondary | ICD-10-CM | POA: Diagnosis present

## 2015-05-21 DIAGNOSIS — E119 Type 2 diabetes mellitus without complications: Secondary | ICD-10-CM

## 2015-05-21 LAB — GLUCOSE, CAPILLARY
GLUCOSE-CAPILLARY: 201 mg/dL — AB (ref 65–99)
GLUCOSE-CAPILLARY: 256 mg/dL — AB (ref 65–99)
GLUCOSE-CAPILLARY: 208 mg/dL — AB (ref 65–99)
GLUCOSE-CAPILLARY: 207 mg/dL — AB (ref 65–99)
Glucose-Capillary: 225 mg/dL — ABNORMAL HIGH (ref 65–99)
Glucose-Capillary: 200 mg/dL — ABNORMAL HIGH (ref 65–99)
Glucose-Capillary: 235 mg/dL — ABNORMAL HIGH (ref 65–99)

## 2015-05-21 LAB — URINE CULTURE

## 2015-05-21 MED ORDER — LEVOTHYROXINE SODIUM 25 MCG PO TABS
25.0000 ug | ORAL_TABLET | Freq: Every day | ORAL | Status: DC
  Administered 2015-05-22: 25 ug via ORAL
  Filled 2015-05-21 (×4): qty 1

## 2015-05-21 MED ORDER — PANTOPRAZOLE SODIUM 40 MG PO TBEC
40.0000 mg | DELAYED_RELEASE_TABLET | Freq: Every day | ORAL | Status: DC
  Administered 2015-05-21 – 2015-05-22 (×2): 40 mg via ORAL
  Filled 2015-05-21: qty 1

## 2015-05-21 MED ORDER — SODIUM CHLORIDE 0.9 % IV BOLUS (SEPSIS)
500.0000 mL | Freq: Once | INTRAVENOUS | Status: AC
  Administered 2015-05-21: 500 mL via INTRAVENOUS

## 2015-05-21 MED ORDER — SODIUM CHLORIDE 0.9 % IV SOLN
INTRAVENOUS | Status: DC
  Administered 2015-05-21 – 2015-05-22 (×2): via INTRAVENOUS

## 2015-05-21 NOTE — Progress Notes (Signed)
[place] TEAM 1 - Stepdown/ICU TEAM Progress Note  [name] HLK:[id number] DOB: 1956/07/16 DOA: 05/18/2015 PCP: Alvester Chou, NP  Admit HPI / Brief Narrative: [name] is a 59 y.o. WF PMHx Recently Dx A-Fib on Apixaban, HTN, Hypothyroidism, Syncope, Anxiety, HTN, Hiatal Hernia, Uterine Cancer, Chronic Pain Syndrome + large amount narcotic use. Diabetes type 2 uncontrolled  Brought to the ER after patient had an episode of syncope. As per the husband who provided the history to the ER physician patient is walking the hall when patient suddenly lost consciousness for about 2-3 minutes. Patient did not have any seizure-like activity but did have incontinence of urine. Patient states over the last 2-3 days patient has been having recurrent episodes of diarrhea with epigastric pain. Patient come to the ER yesterday and CT abdomen showed jejunal inflammation and was discharged home on Cipro and Flagyl. Patient's pain is mostly in the epigastric area. In the ER patient was found to have sinus bradycardia with heart rate around 43 bpm with prolonged QTC of 507 ms. Patient was not hypotensive. Creatinine was mildly elevated. Patient denies any vomiting. Patient was able to take her medications. TSH is mildly elevated around 5. Patient otherwise denies any chest pain or shortness of breath. Patient was admitted at [facility] in July for abdominal pain and at that time had endoscopy which showed diffuse gastritis. During cementation patient also was in A. fib with RVR during which patient was started on amiodarone and Cardizem and metoprolol. Patient at this time is being given IV fluids and admitted for further management for syncope. Patient states over the last few days patient gets dizzy on standing.   HPI/Subjective: 9/10  A/O 4, NAD, follows all commands. States still feeling of dizziness especially when she  when she went from sitting to standing~15 seconds of dizziness  (described as feeling like she was going to pass out). Patient continues to be orthostatic.  Assessment/Plan: Syncope and collapse -Most likely multifactorial to include large amount of narcotic use, positive orthostatic hypotension, Arrhythmia?, Subacute Hypothyroidism -Decrease MS Contin to 45 mg  BID  -DC Vicodin 5-3 25 to TID (currently on hold) -DC morphine IV -Hold all BP medication except for amiodarone for atrial fibrillation -Troponin 3 negative -TSH elevated 5.0, obtain free T4 WNL; findings consistent with subacute hypothyroidism -EEG; normal -Echocardiogram; mild diastolic CHF.  -MRI brain; negative acute findings  -Orthostatic vitals positive  HTN/Orthostatic Hypotension -Improved however patient positive orthostatic BP -Strict in and out; since admission + 3.6 L -Subacute hypothyroidism treated with Synthroid -See syncope and collapse -Adrenal insufficiency? Ordered A.m.Cortisol  Prolonged QT interval -Closely follow metabolic panel as patient also has prolonged QT   Paroxysmal atrial fibrillation  - chads 2 vasc score is 4. Patient is on Apixaban. -Continue amiodarone 400 mg BID -Hold all nodal blocking agents   Hypokalemia -Potassium goal>4  Hypomagnesemia -Magnesium goal> 2  Subacute hypothyroidism (patient Hx hypothyroidism) -Change Synthroid to PO 25 g daily   Chronic epigastric Abdominal pain/Nonobstructing intussusception/colitis  -Elicited pain which would be consistent with hiatal hernia however abdominal/pelvis CT shows  jejunal inflammation with nonobstructing intussusception. In addition with patient's extremely high doses of narcotics may also be suffering with Narcotic Bowel Syndrome -GI; recommends no further workup. Upon discharge follow-up with her own GI physician  -In light of being afebrile, negative leukocytosis will DC Flagyl + ceftriaxone  -KUB; Non obstructed bowel gas - 7/25 Dr.Paul Y Oh performed endoscopy at [facility] which showed diffuse gastritis. Stomach Biopsy from 7/25:Negative H. Pylori, dysplasia, or malignancy.  -Continue Patient on Protonix.  Acute renal failure probably from dehydration  - Continue gently hydration .  Chronic pain syndrom/Scoliosis thoracolumbar spine/Narcotic abuse  - on large doses of morphine which most likely is also contributing to patient's bradycardia, syncope and collapse. -See syncope and collapse  Chronic anemia and thrombocytopenia  - CBC. Stable  Diabetes mellitus type 2 uncontrolled - Increase to moderate SSI  -Hold all oral hypoglycemic  -9/8 hemoglobin A1c = 7.1  -Lipid panel; within ADA guidelines      Code Status: FULL Family Communication: no family present at time of exam Disposition Plan: Completion of syncope and collapse workup    Consultants: GI pending   Procedure/Significant Events: 7/25 gastric biopsy;Negative H. Pylori, dysplasia, or malignancy. 9/6 CT abdomen and pelvis with contrast; Significant inflammation involving multiple loops of proximal jejunum with mild nonobstructing intussusception 9/9 MRI brain; negative acute findings 9/9 echocardiogram;- LVEF= 60% to 65%. -relaxation (grade 1 diastolic dysfunction).    Culture 9/8 MRSA by PCR negative 9/8 stool pending 9/8 blood pending 9/8 urine pending   Antibiotics: Flagyl 9/8>> stopped 9/10 Ceftriaxone 9/8>> stopped 9/10   DVT prophylaxis: Eliquis   Devices    LINES / TUBES:      Continuous Infusions: . sodium chloride 75 mL/hr at 05/21/15 1507    Objective: VITAL SIGNS: Temp: 98.2 F (36.8 C) (09/10 1557) Temp Source: Oral (09/10 1557) BP: 104/48 mmHg (09/10 1600) Pulse Rate: 88 (09/10 1600) SPO2; FIO2:   Intake/Output Summary (Last 24 hours) at 05/21/15 1803 Last data filed at 05/21/15 1600  Gross per 24 hour  Intake   2160 ml  Output    900 ml  Net   1260 ml     Exam: General: A/O 4, NAD, follows all commands, No acute  respiratory distress Eyes: Negative headache, negative scleral hemorrhage ENT: Negative Runny nose, negative ear pain, negative gingival bleeding, Neck:  Negative scars, masses, torticollis, lymphadenopathy, JVD Lungs: Clear to auscultation bilaterally without wheezes or crackles Cardiovascular: Regular rate and rhythm without murmur gallop or rub normal S1 and S2 Abdomen: Positive epigastric abdominal pain, negative dysphagia, nondistended, positive soft, bowel sounds, no rebound, no ascites, no appreciable mass Extremities: No significant cyanosis, clubbing, or edema bilateral lower extremities Psychiatric:  Negative depression, negative anxiety, negative fatigue, negative mania Neurologic:  Cranial nerves II through XII intact, tongue/uvula midline, all extremities muscle strength 5/5, sensation intact throughout,negative dysarthria, negative expressive aphasia, negative receptive aphasia.   Data Reviewed: Basic Metabolic Panel:  Recent Labs Lab 05/17/15 1736 05/18/15 2025 05/19/15 0640 05/20/15 0945  NA 128* 135 135 139  K 3.2* 3.6 3.1* 4.0  CL 101 104 104 110  CO2 22 24 22 22   GLUCOSE 309* 227* 266* 128*  BUN 19 12 12 11   CREATININE 0.96 1.08* 1.02* 1.11*  CALCIUM 7.8* 8.5* 8.3* 8.3*  MG  --   --  1.6* 2.3   Liver Function Tests:  Recent Labs Lab 05/17/15 1736 05/18/15 2025 05/19/15 0640 05/20/15 0945  AST 34 29 34 32  ALT 24 27 28 30   ALKPHOS 71 73 71 73  BILITOT 1.1 1.1 1.0 0.6  PROT 5.5* 5.5* 5.3* 5.3*  ALBUMIN 2.9* 3.0* 2.9* 3.0*    Recent Labs Lab 05/17/15 1736 05/18/15 2025  LIPASE 48 47   No results for input(s): AMMONIA in the last 168 hours. CBC:  Recent Labs Lab 05/17/15 1736 05/18/15 2025 05/19/15  9702 05/20/15 0945  WBC 5.6 4.6 3.7* 5.6  NEUTROABS  --  2.9  --  3.7  HGB 12.1 11.7* 11.5* 11.3*  HCT 36.1 34.7* 34.1* 34.0*  MCV 98.7 94.8 95.3 97.7  PLT 104* 108* 92* 112*   Cardiac Enzymes:  Recent Labs Lab 05/17/15 1736  05/19/15 0317 05/19/15 0640 05/19/15 1250  TROPONINI <0.03 <0.03 <0.03 <0.03   BNP (last 3 results)  Recent Labs  01/10/15 2300  BNP 62.5    ProBNP (last 3 results) No results for input(s): PROBNP in the last 8760 hours.  CBG:  Recent Labs Lab 05/21/15 0055 05/21/15 0442 05/21/15 0819 05/21/15 1152 05/21/15 1540  GLUCAP 225* 201* 200* 256* 208*    Recent Results (from the past 240 hour(s))  MRSA PCR Screening     Status: None   Collection Time: 05/19/15  1:00 AM  Result Value Ref Range Status   MRSA by PCR NEGATIVE NEGATIVE Final    Comment:        The GeneXpert MRSA Assay (FDA approved for NASAL specimens only), is one component of a comprehensive MRSA colonization surveillance program. It is not intended to diagnose MRSA infection nor to guide or monitor treatment for MRSA infections.   Culture, Urine     Status: None   Collection Time: 05/19/15  8:23 PM  Result Value Ref Range Status   Specimen Description URINE, RANDOM  Final   Special Requests NONE  Final   Culture MULTIPLE SPECIES PRESENT, SUGGEST RECOLLECTION  Final   Report Status 05/21/2015 FINAL  Final  Culture, blood (routine x 2)     Status: None (Preliminary result)   Collection Time: 05/19/15  8:30 PM  Result Value Ref Range Status   Specimen Description BLOOD LEFT WRIST  Final   Special Requests BOTTLES DRAWN AEROBIC ONLY 5CC  Final   Culture NO GROWTH 2 DAYS  Final   Report Status PENDING  Incomplete  Culture, blood (routine x 2)     Status: None (Preliminary result)   Collection Time: 05/19/15  8:37 PM  Result Value Ref Range Status   Specimen Description BLOOD RIGHT HAND  Final   Special Requests BOTTLES DRAWN AEROBIC ONLY 5CC  Final   Culture NO GROWTH 2 DAYS  Final   Report Status PENDING  Incomplete     Studies:  Recent x-ray studies have been reviewed in detail by the Attending Physician  Scheduled Meds:  Scheduled Meds: . amiodarone  400 mg Oral BID  . apixaban  5 mg  Oral BID  . atorvastatin  40 mg Oral q1800  . citalopram  20 mg Oral Daily  . feeding supplement (ENSURE ENLIVE)  237 mL Oral TID BM  . Influenza vac split quadrivalent PF  0.5 mL Intramuscular Tomorrow-1000  . insulin aspart  0-15 Units Subcutaneous 6 times per day  . [START ON 05/22/2015] levothyroxine  25 mcg Oral QAC breakfast  . metoCLOPramide  10 mg Oral TID AC & HS  . morphine  45 mg Oral BID  . pantoprazole  40 mg Oral Daily  . sucralfate  1 g Oral TID AC & HS  . zolpidem  5 mg Oral QHS    Time spent on care of this patient: 40 mins   WOODS, Geraldo Docker , MD  [facility] Office  [phone] Pager - [phone]  On-Call/Text Page:      [email or web address]      password [email or web address]  If 7PM-7AM, please contact night-coverage [email or web address] Password [facility] 05/21/2015,  6:03 PM   LOS: 3 days   Care during the described time interval was provided by me .  I have reviewed this patient's available data, including medical history, events of note, physical examination, and all test results as part of my evaluation. I have personally reviewed and interpreted all radiology studies.   Dia Crawford, MD [phone] Pager

## 2015-05-21 NOTE — Progress Notes (Signed)
Tranfered Patient to [id number] RN. 3E RN in room to help settle patient no questions Patient in NAD.

## 2015-05-22 LAB — GLUCOSE, CAPILLARY
GLUCOSE-CAPILLARY: 197 mg/dL — AB (ref 65–99)
GLUCOSE-CAPILLARY: 135 mg/dL — AB (ref 65–99)
Glucose-Capillary: 219 mg/dL — ABNORMAL HIGH (ref 65–99)
Glucose-Capillary: 236 mg/dL — ABNORMAL HIGH (ref 65–99)

## 2015-05-22 LAB — CORTISOL-AM, BLOOD: CORTISOL - AM: 11.2 ug/dL (ref 6.7–22.6)

## 2015-05-22 MED ORDER — MIDODRINE HCL 5 MG PO TABS
5.0000 mg | ORAL_TABLET | Freq: Three times a day (TID) | ORAL | Status: DC
  Administered 2015-05-22: 5 mg via ORAL
  Filled 2015-05-22: qty 1

## 2015-05-22 MED ORDER — MIDODRINE HCL 5 MG PO TABS: 5.0000 mg | ORAL_TABLET | Freq: Three times a day (TID) | ORAL | Status: DC

## 2015-05-22 NOTE — Discharge Instructions (Signed)
Follow with Primary MD Alvester Chou, NP in 7 days   Get CBC, CMP, 2 view Chest X ray checked  by Primary MD next visit.    Activity: As tolerated with Full fall precautions use walker/cane & assistance as needed   Disposition Home     Diet: Heart Healthy Low Carb.  For Heart failure patients - Check your Weight same time everyday, if you gain over 2 pounds, or you develop in leg swelling, experience more shortness of breath or chest pain, call your Primary MD immediately. Follow Cardiac Low Salt Diet and 1.5 lit/day fluid restriction.   On your next visit with your primary care physician please Get Medicines reviewed and adjusted.   Please request your Prim.MD to go over all Hospital Tests and Procedure/Radiological results at the follow up, please get all Hospital records sent to your Prim MD by signing hospital release before you go home.   If you experience worsening of your admission symptoms, develop shortness of breath, life threatening emergency, suicidal or homicidal thoughts you must seek medical attention immediately by calling 911 or calling your MD immediately  if symptoms less severe.  You Must read complete instructions/literature along with all the possible adverse reactions/side effects for all the Medicines you take and that have been prescribed to you. Take any new Medicines after you have completely understood and accpet all the possible adverse reactions/side effects.   Do not drive, operating heavy machinery, perform activities at heights, swimming or participation in water activities or provide baby sitting services if your were admitted for syncope or siezures until you have seen by Primary MD or a Neurologist and advised to do so again.  Do not drive when taking Pain medications.    Do not take more than prescribed Pain, Sleep and Anxiety Medications  Special Instructions: If you have smoked or chewed Tobacco  in the last 2 yrs please stop smoking, stop any  regular Alcohol  and or any Recreational drug use.  Wear Seat belts while driving.   Please note  You were cared for by a hospitalist during your hospital stay. If you have any questions about your discharge medications or the care you received while you were in the hospital after you are discharged, you can call the unit and asked to speak with the hospitalist on call if the hospitalist that took care of you is not available. Once you are discharged, your primary care physician will handle any further medical issues. Please note that NO REFILLS for any discharge medications will be authorized once you are discharged, as it is imperative that you return to your primary care physician (or establish a relationship with a primary care physician if you do not have one) for your aftercare needs so that they can reassess your need for medications and monitor your lab values.

## 2015-05-22 NOTE — Discharge Summary (Signed)
[name], is a 59 y.o. female  DOB Apr 12, 1956  MRN [id number].  Admission date:  05/18/2015  Admitting Physician  Rise Patience, MD  Discharge Date:  05/22/2015   Primary MD  Alvester Chou, NP  Recommendations for primary care physician for things to follow:   Needs outpatient GI follow-up within 1 week. Minimize narcotic use.  Check CBC, CMP  within 5-7 days   Admission Diagnosis  Syncope and collapse [R55]   Discharge Diagnosis  Syncope and collapse [R55]    Active Problems:   Chronic pain syndrome   Abdominal pain   Syncope   Chronic atrial fibrillation   Diabetes mellitus type 2, controlled   Diarrhea   Chronic anemia   Thrombocytopenia   ARF (acute renal failure)   Bradycardia   Prolonged QT interval   Paroxysmal atrial fibrillation   Other specified hypotension   Hypomagnesemia   Colitis   Acute renal failure syndrome   Narcotic abuse   Type 2 diabetes mellitus without complication   Other specified hypothyroidism   Abdominal pain, chronic, epigastric   Intussusception   Diabetes type 2, uncontrolled   Intussusception intestine      Past Medical History  Diagnosis Date  . Type 1 diabetes   . Hypothyroid   . Degenerative disk disease   . Stomach ulcer   . Diverticulitis   . Syncope 01/2015  . Anxiety   . GERD (gastroesophageal reflux disease)   . History of hiatal hernia   . Cancer     HX OF CANCER OF UTERUS   . TIA (transient ischemic attack) 02/2015  . Hypertension   . Pancreatitis   . PAF (paroxysmal atrial fibrillation)     a. new onset 03/2015 in setting of intractable N/V; b. on Eliquis 5 mg bid; c. CHADSVASc at least 4 (DM, TIA x 2, female)    Past Surgical History  Procedure Laterality Date  . Hernia repair    . Abdominal hysterectomy    . Cholecystectomy     . Esophagogastroduodenoscopy N/A 04/04/2015    Procedure: ESOPHAGOGASTRODUODENOSCOPY (EGD);  Surgeon: Hulen Luster, MD;  Location: [facility] ENDOSCOPY;  Service: Endoscopy;  Laterality: N/A;       HPI  from the history and physical done on the day of admission:    [name] is a 59 y.o. WF PMHx Recently Dx A-Fib on Apixaban, HTN, Hypothyroidism, Syncope, Anxiety, HTN, Hiatal Hernia, Uterine Cancer, Chronic Pain Syndrome + large amount narcotic use. Diabetes type 2 uncontrolled  Brought to the ER after patient had an episode of syncope. As per the husband who provided the history to the ER physician patient is walking the hall when patient suddenly lost consciousness for about 2-3 minutes. Patient did not have any seizure-like activity but did have incontinence of urine. Patient states over the last 2-3 days patient has been having recurrent episodes of diarrhea with epigastric pain. Patient come to the ER yesterday and CT abdomen showed jejunal inflammation and was discharged home on  Cipro and Flagyl. Patient's pain is mostly in the epigastric area. In the ER patient was found to have sinus bradycardia with heart rate around 43 bpm with prolonged QTC of 507 ms. Patient was not hypotensive. Creatinine was mildly elevated. Patient denies any vomiting. Patient was able to take her medications. TSH is mildly elevated around 5. Patient otherwise denies any chest pain or shortness of breath. Patient was admitted at [facility] in July for abdominal pain and at that time had endoscopy which showed diffuse gastritis. During cementation patient also was in A. fib with RVR during which patient was started on amiodarone and Cardizem and metoprolol. Patient at this time is being given IV fluids and admitted for further management for syncope. Patient states over the last few days patient gets dizzy on standing.      Hospital Course:     1. Syncope due to orthostatic hypotension in the presence of  massive narcotic use. Echogram stable, EEG normal, TSH borderline at 5. CT head and MRI brain nonacute. Placed on TED stockings along with midodrine after IV fluids. Counseled to minimize narcotics. Syncope instructions given by me personally to stand up wait for a few minutes before walking, to sit down immediately if she feels she is getting lightheaded. I will request PCP to minimize narcotics. Repeat TSH in 4 weeks. Monitor orthostatics.  2. Borderline QT. Avoid QT prolonging medications.  3. Paroxysmal atrial fibrillation with [place] score of over 3. Continue Eliquis, on amiodarone, request to follow with cardiologist outpatient to monitor QTC and A. Fib.  4. Hypothyroid. Continue home dose Synthroid. TSH was 5. Request PCP to repeat TSH in 4 weeks.  5. Dyslipidemia. Continue home dose statin  6. Chronic epigastric abdominal pain with nonspecific CT scan findings. Seen and cleared for discharge by GI physician Dr. Ardis Hughs, requested to follow with her primary gastroenterologist at [facility]. Completely symptom-free at this time. Again minimize narcotic use. She could have gastroparesis worsened by massive narcotic use.  7. DM type II. Resume home regimen.  8. Chronic anemia follow with PCP.    Discharge Condition: Stable  Follow UP  Follow-up Information    Follow up with Alvester Chou, NP. Schedule an appointment as soon as possible for a visit in 1 week.   Specialty:  Nurse Practitioner   Contact information:   [facility] [address] [place] [address] [phone]       Follow up with [place], Lupita Dawn, MD. Schedule an appointment as soon as possible for a visit in 1 week.   Specialty:  Internal Medicine   Contact information:   [place] [address] [phone]        Consults obtained - GI  Diet and Activity recommendation: See Discharge Instructions below  Discharge Instructions           Discharge Instructions      Discharge instructions    Complete by:  As directed   Follow with Primary MD Alvester Chou, NP in 7 days   Get CBC, CMP, 2 view Chest X ray checked  by Primary MD next visit.    Activity: As tolerated with Full fall precautions use walker/cane & assistance as needed   Disposition Home     Diet: Heart Healthy Low Carb.  For Heart failure patients - Check your Weight same time everyday, if you gain over 2 pounds, or you develop in leg swelling, experience more shortness of breath or  chest pain, call your Primary MD immediately. Follow Cardiac Low Salt Diet and 1.5 lit/day fluid restriction.   On your next visit with your primary care physician please Get Medicines reviewed and adjusted.   Please request your Prim.MD to go over all Hospital Tests and Procedure/Radiological results at the follow up, please get all Hospital records sent to your Prim MD by signing hospital release before you go home.   If you experience worsening of your admission symptoms, develop shortness of breath, life threatening emergency, suicidal or homicidal thoughts you must seek medical attention immediately by calling 911 or calling your MD immediately  if symptoms less severe.  You Must read complete instructions/literature along with all the possible adverse reactions/side effects for all the Medicines you take and that have been prescribed to you. Take any new Medicines after you have completely understood and accpet all the possible adverse reactions/side effects.   Do not drive, operating heavy machinery, perform activities at heights, swimming or participation in water activities or provide baby sitting services if your were admitted for syncope or siezures until you have seen by Primary MD or a Neurologist and advised to do so again.  Do not drive when taking Pain medications.    Do not take more than prescribed Pain, Sleep and Anxiety Medications  Special Instructions: If you have smoked or chewed  Tobacco  in the last 2 yrs please stop smoking, stop any regular Alcohol  and or any Recreational drug use.  Wear Seat belts while driving.   Please note  You were cared for by a hospitalist during your hospital stay. If you have any questions about your discharge medications or the care you received while you were in the hospital after you are discharged, you can call the unit and asked to speak with the hospitalist on call if the hospitalist that took care of you is not available. Once you are discharged, your primary care physician will handle any further medical issues. Please note that NO REFILLS for any discharge medications will be authorized once you are discharged, as it is imperative that you return to your primary care physician (or establish a relationship with a primary care physician if you do not have one) for your aftercare needs so that they can reassess your need for medications and monitor your lab values.     Increase activity slowly    Complete by:  As directed              Discharge Medications       Medication List    STOP taking these medications        ciprofloxacin 500 MG tablet  Commonly known as:  CIPRO     metroNIDAZOLE 500 MG tablet  Commonly known as:  FLAGYL      TAKE these medications        amiodarone 400 MG tablet  Commonly known as:  PACERONE  Take 1 tablet (400 mg total) by mouth 2 (two) times daily.     apixaban 5 MG Tabs tablet  Commonly known as:  ELIQUIS  Take 1 tablet (5 mg total) by mouth 2 (two) times daily.     atorvastatin 40 MG tablet  Commonly known as:  LIPITOR  Take 1 tablet (40 mg total) by mouth daily at 6 PM.     citalopram 20 MG tablet  Commonly known as:  CELEXA  Take 1 tablet by mouth daily.     cloNIDine 0.1 MG tablet  Commonly known as:  CATAPRES  Take 1 tablet (0.1 mg total) by mouth 3 (three) times daily as needed.     collagenase ointment  Commonly known as:  SANTYL  Apply topically daily.      dicyclomine 10 MG capsule  Commonly known as:  BENTYL  Take 1 capsule by mouth 4 (four) times daily.     diltiazem 120 MG 24 hr capsule  Commonly known as:  CARDIZEM CD  Take 1 capsule (120 mg total) by mouth daily.     diphenhydrAMINE 25 mg capsule  Commonly known as:  BENADRYL  Take 2 capsules (50 mg total) by mouth every 6 (six) hours as needed.     HYDROcodone-acetaminophen 5-325 MG per tablet  Commonly known as:  NORCO/VICODIN  Take 1 tablet by mouth every 6 (six) hours as needed for moderate pain.     JANUVIA 100 MG tablet  Generic drug:  sitaGLIPtin  Take 100 mg by mouth daily.     lisinopril 5 MG tablet  Commonly known as:  PRINIVIL,ZESTRIL  Take 1 tablet by mouth daily.     loperamide 2 MG tablet  Commonly known as:  IMODIUM A-D  Take 2 tablets (4 mg total) by mouth 4 (four) times daily as needed for diarrhea or loose stools.     metFORMIN 1000 MG tablet  Commonly known as:  GLUCOPHAGE  Take 1 tablet by mouth 2 (two) times daily.     metoCLOPramide 10 MG tablet  Commonly known as:  REGLAN  Take 1 tablet (10 mg total) by mouth 4 (four) times daily -  before meals and at bedtime.     metoprolol succinate 50 MG 24 hr tablet  Commonly known as:  TOPROL-XL  Take 1 tablet (50 mg total) by mouth daily.     midodrine 5 MG tablet  Commonly known as:  PROAMATINE  Take 1 tablet (5 mg total) by mouth 3 (three) times daily with meals.     morphine 100 MG 12 hr tablet  Commonly known as:  MS CONTIN  Take 100 mg by mouth 2 (two) times daily.     ondansetron 8 MG disintegrating tablet  Commonly known as:  ZOFRAN ODT  Take 1 tablet (8 mg total) by mouth every 8 (eight) hours as needed for nausea or vomiting.     pantoprazole 40 MG tablet  Commonly known as:  PROTONIX  Take 1 tablet (40 mg total) by mouth 2 (two) times daily.     sucralfate 1 G tablet  Commonly known as:  CARAFATE  Take 1 tablet (1 g total) by mouth 4 (four) times daily.     tiZANidine 4 MG tablet    Commonly known as:  ZANAFLEX  Take 1 tablet by mouth every 6 (six) hours as needed for muscle spasms.     zolpidem 5 MG tablet  Commonly known as:  AMBIEN  Take 1 tablet by mouth at bedtime.        Major procedures and Radiology Reports - PLEASE review detailed and final reports for all details, in brief -   TTE  Left ventricle: The cavity size was normal. Wall thickness wasnormal. Systolic function was normal. The estimated ejectionfraction was in the range of 60% to 65%. Wall motion was normal;there were no regional wall motion abnormalities. Dopplerparameters are consistent with abnormal left ventricularrelaxation (grade 1 diastolic dysfunction).   Dg Chest 2 View  05/18/2015   CLINICAL DATA:  Epigastric pain, nausea and vomiting for  3 days.  EXAM: CHEST  2 VIEW  COMPARISON:  PA and lateral chest 05/17/2015.  FINDINGS: The lungs are clear. Heart size is upper normal. No pneumothorax or pleural effusion. Scoliosis noted  IMPRESSION: No acute disease.   Electronically Signed   By: Inge Rise M.D.   On: 05/18/2015 21:14   Dg Chest 2 View  05/17/2015   CLINICAL DATA:  Epigastric chest pain nausea and vomiting for 3 days  EXAM: CHEST  2 VIEW  COMPARISON:  04/05/2015  FINDINGS: Scoliosis thoracolumbar spine. Heart size and vascular pattern normal. Lungs clear. No pleural effusions.  IMPRESSION: No acute finding   Electronically Signed   By: Skipper Cliche M.D.   On: 05/17/2015 18:16   Dg Abd 1 View  05/19/2015   CLINICAL DATA:  59 year old female with abdominal pain and diarrhea. Inflamed small bowel on recent CT Abdomen and Pelvis. Initial encounter.  EXAM: ABDOMEN - 1 VIEW  COMPARISON:  CT Abdomen and Pelvis 05/17/2015, and earlier  FINDINGS: Portable AP supine view at 0749 hours. Oral contrast administered on 05/17/2015 has reached the splenic flexure. Non obstructed bowel gas pattern. Abdominal and pelvic visceral contours are stable and within normal limits. Stable visualized  osseous structures. No definite pneumoperitoneum on this supine view.  IMPRESSION: Normal bowel gas pattern. Oral contrast has reached the splenic flexure.   Electronically Signed   By: Genevie Ann M.D.   On: 05/19/2015 07:57   Ct Head Wo Contrast  05/18/2015   CLINICAL DATA:  59 year old female with fall and headache  EXAM: CT HEAD WITHOUT CONTRAST  TECHNIQUE: Contiguous axial images were obtained from the base of the skull through the vertex without intravenous contrast.  COMPARISON:  CT dated 04/13/2015  FINDINGS: The ventricles and sulci are appropriate in size for the patient's age. There is no intracranial hemorrhage. No mass effect or midline shift identified. The gray-white matter differentiation is preserved. There is no extra-axial fluid collection.  The visualized paranasal sinuses and mastoid air cells are well aerated. The calvarium is intact.  IMPRESSION: No acute intracranial pathology.   Electronically Signed   By: Anner Crete M.D.   On: 05/18/2015 22:05   Mr Brain Wo Contrast  05/20/2015   CLINICAL DATA:  Syncopal episode, lost consciousness per 2-3 minutes while walking in house. Urinary incontinence. Recent diagnosis of atrial fibrillation. History of diabetes, cancer, hypertension.  EXAM: MRI HEAD WITHOUT CONTRAST  TECHNIQUE: Multiplanar, multiecho pulse sequences of the brain and surrounding structures were obtained without intravenous contrast.  COMPARISON:  CT head May 18, 2015 and MRI of the brain February 16, 2015  FINDINGS: The ventricles and sulci are normal for patient's age. No suspicious parenchymal signal, mass lesions, mass effect. No reduced diffusion to suggest acute ischemia. No susceptibility artifact to suggest hemorrhage. Scattered subcentimeter supratentorial white matter T2 hyperintensities.  No abnormal extra-axial fluid collections. No extra-axial masses though, contrast enhanced sequences would be more sensitive. Normal major intracranial vascular flow voids seen at  the skull base. Bilateral hippocampi demonstrate normal size, morphology and signal characteristics.  Ocular globes and orbital contents are unremarkable though not tailored for evaluation. No abnormal sellar expansion. Small bilateral mastoid effusions. Paranasal sinuses are well-aerated. No suspicious calvarial bone marrow signal. No abnormal sellar expansion. Craniocervical junction maintained. Patient is edentulous.  IMPRESSION: No acute intracranial process, specifically no acute ischemia.  Similar mild chronic small vessel ischemic disease.   Electronically Signed   By: Elon Alas M.D.   On: 05/20/2015 02:35  Ct Abdomen Pelvis W Contrast  05/17/2015   CLINICAL DATA:  Pt c/o generalized abd pain with diarrhea x3 days  EXAM: CT ABDOMEN AND PELVIS WITH CONTRAST  TECHNIQUE: Multidetector CT imaging of the abdomen and pelvis was performed using the standard protocol following bolus administration of intravenous contrast.  CONTRAST:  146m OMNIPAQUE IOHEXOL 300 MG/ML  SOLN  COMPARISON:  04/03/2015  FINDINGS: Lower chest:  Clear  Hepatobiliary: Gallbladder surgically absent  Pancreas: Normal  Spleen: Normal  Adrenals/Urinary Tract: Mild dilatation of both ureters with no ureteral stones, likely due to mild bladder distention. 735mobstructing stone or pole right kidney  Stomach/Bowel: Proximal jejunum show significant wall thickening involving several loops. She mild intussusception identified involving left upper quadrant jejunum. Mild dilatation both proximal and distal to this. Contrast seen well beyond this area into more distal small bowel.  Vascular/Lymphatic: No acute findings. Mild calcification aortoiliac vessels.  Reproductive: Not identified  Other: Small volume ascites  Musculoskeletal: No acute musculoskeletal findings  IMPRESSION: Significant inflammation involving multiple loops of proximal jejunum with mild nonobstructing intussusception   Electronically Signed   By: RaSkipper Cliche.D.    On: 05/17/2015 19:50    Micro Results      Recent Results (from the past 240 hour(s))  MRSA PCR Screening     Status: None   Collection Time: 05/19/15  1:00 AM  Result Value Ref Range Status   MRSA by PCR NEGATIVE NEGATIVE Final    Comment:        The GeneXpert MRSA Assay (FDA approved for NASAL specimens only), is one component of a comprehensive MRSA colonization surveillance program. It is not intended to diagnose MRSA infection nor to guide or monitor treatment for MRSA infections.   Culture, Urine     Status: None   Collection Time: 05/19/15  8:23 PM  Result Value Ref Range Status   Specimen Description URINE, RANDOM  Final   Special Requests NONE  Final   Culture MULTIPLE SPECIES PRESENT, SUGGEST RECOLLECTION  Final   Report Status 05/21/2015 FINAL  Final  Culture, blood (routine x 2)     Status: None (Preliminary result)   Collection Time: 05/19/15  8:30 PM  Result Value Ref Range Status   Specimen Description BLOOD LEFT WRIST  Final   Special Requests BOTTLES DRAWN AEROBIC ONLY 5CC  Final   Culture NO GROWTH 2 DAYS  Final   Report Status PENDING  Incomplete  Culture, blood (routine x 2)     Status: None (Preliminary result)   Collection Time: 05/19/15  8:37 PM  Result Value Ref Range Status   Specimen Description BLOOD RIGHT HAND  Final   Special Requests BOTTLES DRAWN AEROBIC ONLY 5CC  Final   Culture NO GROWTH 2 DAYS  Final   Report Status PENDING  Incomplete       Today   Subjective    BaQuintana Canelooday has no headache,no chest abdominal pain,no new weakness tingling or numbness, feels much better wants to go home today.     Objective   Blood pressure 134/70, pulse 91, temperature 98.5 F (36.9 C), temperature source Oral, resp. rate 18, height 5' 3"  (1.6 m), weight 66.633 kg (146 lb 14.4 oz), SpO2 97 %.   Intake/Output Summary (Last 24 hours) at 05/22/15 1118 Last data filed at 05/22/15 0600  Gross per 24 hour  Intake 2296.25 ml  Output     575 ml  Net 1721.25 ml    Exam Awake Alert, Oriented  x 3, No new F.N deficits, Normal affect [place].AT,PERRAL Supple Neck,No JVD, No cervical lymphadenopathy appriciated.  Symmetrical Chest wall movement, Good air movement bilaterally, CTAB RRR,No Gallops,Rubs or new Murmurs, No Parasternal Heave +ve B.Sounds, Abd Soft, Non tender, No organomegaly appriciated, No rebound -guarding or rigidity. No Cyanosis, Clubbing or edema, No new Rash or bruise   Data Review   CBC w Diff:  Lab Results  Component Value Date   WBC 5.6 05/20/2015   WBC 8.4 10/13/2014   HGB 11.3* 05/20/2015   HGB 12.9 10/13/2014   HCT 34.0* 05/20/2015   HCT 38.4 10/13/2014   PLT 112* 05/20/2015   PLT 113* 10/13/2014   LYMPHOPCT 25 05/20/2015   LYMPHOPCT 24.8 10/13/2014   BANDSPCT 0 10/26/2007   MONOPCT 8 05/20/2015   MONOPCT 9.6 10/13/2014   EOSPCT 2 05/20/2015   EOSPCT 2.0 10/13/2014   BASOPCT 0 05/20/2015   BASOPCT 0.4 10/13/2014    CMP:  Lab Results  Component Value Date   NA 139 05/20/2015   NA 138 10/14/2014   K 4.0 05/20/2015   K 4.1 10/14/2014   CL 110 05/20/2015   CL 107 10/14/2014   CO2 22 05/20/2015   CO2 26 10/14/2014   BUN 11 05/20/2015   BUN 3* 10/14/2014   CREATININE 1.11* 05/20/2015   CREATININE 0.68 10/14/2014   PROT 5.3* 05/20/2015   PROT 5.7* 10/12/2014   ALBUMIN 3.0* 05/20/2015   ALBUMIN 2.7* 10/12/2014   BILITOT 0.6 05/20/2015   BILITOT 0.8 10/12/2014   ALKPHOS 73 05/20/2015   ALKPHOS 78 10/12/2014   AST 32 05/20/2015   AST 42* 10/12/2014   ALT 30 05/20/2015   ALT 95* 10/12/2014  .   Total Time in preparing paper work, data evaluation and todays exam - 35 minutes  Thurnell Lose M.D on 05/22/2015 at 11:18 AM  [facility]   Office  [phone]

## 2015-05-24 LAB — CULTURE, BLOOD (ROUTINE X 2)
Culture: NO GROWTH
Culture: NO GROWTH

## 2015-05-26 ENCOUNTER — Ambulatory Visit: Payer: Medicare HMO | Admitting: Primary Care

## 2015-05-27 ENCOUNTER — Encounter (HOSPITAL_COMMUNITY): Payer: Self-pay | Admitting: Emergency Medicine

## 2015-05-27 ENCOUNTER — Inpatient Hospital Stay (HOSPITAL_COMMUNITY)
Admission: EM | Admit: 2015-05-27 | Discharge: 2015-05-31 | DRG: 308 | Disposition: A | Discharge status: Home Health | Payer: Commercial Managed Care - HMO | Attending: Internal Medicine | Admitting: Internal Medicine

## 2015-05-27 ENCOUNTER — Inpatient Hospital Stay (HOSPITAL_COMMUNITY): Payer: Commercial Managed Care - HMO

## 2015-05-27 ENCOUNTER — Emergency Department (HOSPITAL_COMMUNITY): Payer: Commercial Managed Care - HMO

## 2015-05-27 DIAGNOSIS — R001 Bradycardia, unspecified: Secondary | ICD-10-CM | POA: Diagnosis not present

## 2015-05-27 DIAGNOSIS — I951 Orthostatic hypotension: Secondary | ICD-10-CM | POA: Diagnosis not present

## 2015-05-27 DIAGNOSIS — R109 Unspecified abdominal pain: Secondary | ICD-10-CM | POA: Diagnosis not present

## 2015-05-27 DIAGNOSIS — G894 Chronic pain syndrome: Secondary | ICD-10-CM | POA: Diagnosis not present

## 2015-05-27 DIAGNOSIS — Z8673 Personal history of transient ischemic attack (TIA), and cerebral infarction without residual deficits: Secondary | ICD-10-CM

## 2015-05-27 DIAGNOSIS — R0789 Other chest pain: Secondary | ICD-10-CM | POA: Diagnosis not present

## 2015-05-27 DIAGNOSIS — I9589 Other hypotension: Secondary | ICD-10-CM

## 2015-05-27 DIAGNOSIS — I480 Paroxysmal atrial fibrillation: Secondary | ICD-10-CM | POA: Diagnosis not present

## 2015-05-27 DIAGNOSIS — E1165 Type 2 diabetes mellitus with hyperglycemia: Secondary | ICD-10-CM | POA: Diagnosis present

## 2015-05-27 DIAGNOSIS — I10 Essential (primary) hypertension: Secondary | ICD-10-CM

## 2015-05-27 DIAGNOSIS — R079 Chest pain, unspecified: Secondary | ICD-10-CM

## 2015-05-27 DIAGNOSIS — T50995A Adverse effect of other drugs, medicaments and biological substances, initial encounter: Secondary | ICD-10-CM | POA: Diagnosis present

## 2015-05-27 DIAGNOSIS — I248 Other forms of acute ischemic heart disease: Secondary | ICD-10-CM | POA: Diagnosis not present

## 2015-05-27 DIAGNOSIS — E785 Hyperlipidemia, unspecified: Secondary | ICD-10-CM | POA: Diagnosis present

## 2015-05-27 DIAGNOSIS — K3184 Gastroparesis: Secondary | ICD-10-CM

## 2015-05-27 DIAGNOSIS — N390 Urinary tract infection, site not specified: Secondary | ICD-10-CM | POA: Diagnosis present

## 2015-05-27 DIAGNOSIS — E039 Hypothyroidism, unspecified: Secondary | ICD-10-CM | POA: Diagnosis present

## 2015-05-27 DIAGNOSIS — Z8542 Personal history of malignant neoplasm of other parts of uterus: Secondary | ICD-10-CM

## 2015-05-27 DIAGNOSIS — G8929 Other chronic pain: Secondary | ICD-10-CM

## 2015-05-27 DIAGNOSIS — E038 Other specified hypothyroidism: Secondary | ICD-10-CM

## 2015-05-27 DIAGNOSIS — IMO0002 Reserved for concepts with insufficient information to code with codable children: Secondary | ICD-10-CM

## 2015-05-27 DIAGNOSIS — Z79899 Other long term (current) drug therapy: Secondary | ICD-10-CM

## 2015-05-27 DIAGNOSIS — R404 Transient alteration of awareness: Secondary | ICD-10-CM | POA: Diagnosis not present

## 2015-05-27 DIAGNOSIS — E871 Hypo-osmolality and hyponatremia: Secondary | ICD-10-CM | POA: Diagnosis not present

## 2015-05-27 DIAGNOSIS — R9431 Abnormal electrocardiogram [ECG] [EKG]: Secondary | ICD-10-CM

## 2015-05-27 DIAGNOSIS — R531 Weakness: Secondary | ICD-10-CM | POA: Diagnosis not present

## 2015-05-27 DIAGNOSIS — I200 Unstable angina: Secondary | ICD-10-CM | POA: Diagnosis not present

## 2015-05-27 DIAGNOSIS — M5126 Other intervertebral disc displacement, lumbar region: Secondary | ICD-10-CM

## 2015-05-27 DIAGNOSIS — I2489 Other forms of acute ischemic heart disease: Secondary | ICD-10-CM

## 2015-05-27 DIAGNOSIS — I4581 Long QT syndrome: Secondary | ICD-10-CM | POA: Diagnosis present

## 2015-05-27 DIAGNOSIS — Z79891 Long term (current) use of opiate analgesic: Secondary | ICD-10-CM

## 2015-05-27 DIAGNOSIS — E1065 Type 1 diabetes mellitus with hyperglycemia: Secondary | ICD-10-CM

## 2015-05-27 DIAGNOSIS — S299XXA Unspecified injury of thorax, initial encounter: Secondary | ICD-10-CM | POA: Diagnosis not present

## 2015-05-27 DIAGNOSIS — R7989 Other specified abnormal findings of blood chemistry: Secondary | ICD-10-CM | POA: Diagnosis not present

## 2015-05-27 DIAGNOSIS — B9689 Other specified bacterial agents as the cause of diseases classified elsewhere: Secondary | ICD-10-CM | POA: Diagnosis present

## 2015-05-27 DIAGNOSIS — E43 Unspecified severe protein-calorie malnutrition: Secondary | ICD-10-CM

## 2015-05-27 DIAGNOSIS — F1120 Opioid dependence, uncomplicated: Secondary | ICD-10-CM | POA: Diagnosis not present

## 2015-05-27 DIAGNOSIS — D649 Anemia, unspecified: Secondary | ICD-10-CM

## 2015-05-27 DIAGNOSIS — R55 Syncope and collapse: Secondary | ICD-10-CM | POA: Diagnosis not present

## 2015-05-27 DIAGNOSIS — S0990XA Unspecified injury of head, initial encounter: Secondary | ICD-10-CM | POA: Diagnosis not present

## 2015-05-27 DIAGNOSIS — R778 Other specified abnormalities of plasma proteins: Secondary | ICD-10-CM

## 2015-05-27 DIAGNOSIS — E860 Dehydration: Secondary | ICD-10-CM

## 2015-05-27 DIAGNOSIS — R5383 Other fatigue: Secondary | ICD-10-CM | POA: Diagnosis present

## 2015-05-27 DIAGNOSIS — E876 Hypokalemia: Secondary | ICD-10-CM | POA: Diagnosis not present

## 2015-05-27 DIAGNOSIS — R296 Repeated falls: Secondary | ICD-10-CM | POA: Diagnosis present

## 2015-05-27 DIAGNOSIS — G9340 Encephalopathy, unspecified: Secondary | ICD-10-CM | POA: Diagnosis not present

## 2015-05-27 DIAGNOSIS — G92 Toxic encephalopathy: Secondary | ICD-10-CM | POA: Diagnosis present

## 2015-05-27 DIAGNOSIS — D696 Thrombocytopenia, unspecified: Secondary | ICD-10-CM

## 2015-05-27 DIAGNOSIS — K529 Noninfective gastroenteritis and colitis, unspecified: Secondary | ICD-10-CM

## 2015-05-27 DIAGNOSIS — F1110 Opioid abuse, uncomplicated: Secondary | ICD-10-CM

## 2015-05-27 DIAGNOSIS — K219 Gastro-esophageal reflux disease without esophagitis: Secondary | ICD-10-CM | POA: Diagnosis present

## 2015-05-27 DIAGNOSIS — R809 Proteinuria, unspecified: Secondary | ICD-10-CM

## 2015-05-27 DIAGNOSIS — S199XXA Unspecified injury of neck, initial encounter: Secondary | ICD-10-CM | POA: Diagnosis not present

## 2015-05-27 DIAGNOSIS — R197 Diarrhea, unspecified: Secondary | ICD-10-CM

## 2015-05-27 DIAGNOSIS — I4891 Unspecified atrial fibrillation: Secondary | ICD-10-CM

## 2015-05-27 DIAGNOSIS — Z7901 Long term (current) use of anticoagulants: Secondary | ICD-10-CM | POA: Diagnosis not present

## 2015-05-27 DIAGNOSIS — E119 Type 2 diabetes mellitus without complications: Secondary | ICD-10-CM

## 2015-05-27 DIAGNOSIS — R1013 Epigastric pain: Secondary | ICD-10-CM

## 2015-05-27 DIAGNOSIS — F341 Dysthymic disorder: Secondary | ICD-10-CM

## 2015-05-27 DIAGNOSIS — K561 Intussusception: Secondary | ICD-10-CM

## 2015-05-27 DIAGNOSIS — I482 Chronic atrial fibrillation, unspecified: Secondary | ICD-10-CM

## 2015-05-27 HISTORY — DX: Intussusception: K56.1

## 2015-05-27 LAB — CBC WITH DIFFERENTIAL/PLATELET
Basophils Absolute: 0 10*3/uL (ref 0.0–0.1)
Basophils Relative: 0 %
EOS ABS: 0.1 10*3/uL (ref 0.0–0.7)
EOS PCT: 1 %
HCT: 38.3 % (ref 36.0–46.0)
Hemoglobin: 13.4 g/dL (ref 12.0–15.0)
LYMPHS ABS: 1 10*3/uL (ref 0.7–4.0)
Lymphocytes Relative: 12 %
MCH: 32.4 pg (ref 26.0–34.0)
MCHC: 35 g/dL (ref 30.0–36.0)
MCV: 92.5 fL (ref 78.0–100.0)
MONOS PCT: 6 %
Monocytes Absolute: 0.5 10*3/uL (ref 0.1–1.0)
Neutro Abs: 6.9 10*3/uL (ref 1.7–7.7)
Neutrophils Relative %: 81 %
PLATELETS: 149 10*3/uL — AB (ref 150–400)
RBC: 4.14 MIL/uL (ref 3.87–5.11)
RDW: 12.4 % (ref 11.5–15.5)
WBC: 8.5 10*3/uL (ref 4.0–10.5)

## 2015-05-27 LAB — BASIC METABOLIC PANEL
Anion gap: 11 (ref 5–15)
BUN: 14 mg/dL (ref 6–20)
CHLORIDE: 97 mmol/L — AB (ref 101–111)
CO2: 24 mmol/L (ref 22–32)
CREATININE: 1.05 mg/dL — AB (ref 0.44–1.00)
Calcium: 9.2 mg/dL (ref 8.9–10.3)
GFR calc Af Amer: >60 mL/min (ref >60)
GFR, EST NON AFRICAN AMERICAN: 56 mL/min — AB (ref >60)
Glucose, Bld: 266 mg/dL — ABNORMAL HIGH (ref 65–99)
Potassium: 4.6 mmol/L (ref 3.5–5.1)
SODIUM: 132 mmol/L — AB (ref 135–145)

## 2015-05-27 LAB — URINALYSIS, ROUTINE W REFLEX MICROSCOPIC
BILIRUBIN URINE: NEGATIVE
Glucose, UA: 250 mg/dL — AB
KETONES UR: 15 mg/dL — AB
Leukocytes, UA: NEGATIVE
NITRITE: NEGATIVE
PROTEIN: NEGATIVE mg/dL
SPECIFIC GRAVITY, URINE: 1.013 (ref 1.005–1.030)
UROBILINOGEN UA: 0.2 mg/dL (ref 0.0–1.0)
pH: 7 (ref 5.0–8.0)

## 2015-05-27 LAB — I-STAT TROPONIN, ED: TROPONIN I, POC: 0.19 ng/mL — AB (ref 0.00–0.08)

## 2015-05-27 LAB — GLUCOSE, CAPILLARY: Glucose-Capillary: 206 mg/dL — ABNORMAL HIGH (ref 65–99)

## 2015-05-27 LAB — HEPATIC FUNCTION PANEL
ALK PHOS: 71 U/L (ref 38–126)
ALT: 29 U/L (ref 14–54)
AST: 30 U/L (ref 15–41)
Albumin: 3.2 g/dL — ABNORMAL LOW (ref 3.5–5.0)
BILIRUBIN DIRECT: 0.3 mg/dL (ref 0.1–0.5)
Indirect Bilirubin: 0.7 mg/dL (ref 0.3–0.9)
Total Bilirubin: 1 mg/dL (ref 0.3–1.2)
Total Protein: 6.1 g/dL — ABNORMAL LOW (ref 6.5–8.1)

## 2015-05-27 LAB — URINE MICROSCOPIC-ADD ON

## 2015-05-27 LAB — AMMONIA: AMMONIA: 17 umol/L (ref 9–35)

## 2015-05-27 LAB — I-STAT CG4 LACTIC ACID, ED
LACTIC ACID, VENOUS: 1.26 mmol/L (ref 0.5–2.0)
Lactic Acid, Venous: 1.89 mmol/L (ref 0.5–2.0)

## 2015-05-27 LAB — CBG MONITORING, ED: Glucose-Capillary: 249 mg/dL — ABNORMAL HIGH (ref 65–99)

## 2015-05-27 LAB — TSH: TSH: 4.609 u[IU]/mL — AB (ref 0.350–4.500)

## 2015-05-27 MED ORDER — MORPHINE SULFATE ER 100 MG PO TBCR
100.0000 mg | EXTENDED_RELEASE_TABLET | Freq: Two times a day (BID) | ORAL | Status: DC
  Administered 2015-05-27: 100 mg via ORAL
  Filled 2015-05-27 (×2): qty 1

## 2015-05-27 MED ORDER — LISINOPRIL 5 MG PO TABS: 5.0000 mg | ORAL_TABLET | Freq: Every day | ORAL | Status: DC

## 2015-05-27 MED ORDER — ASPIRIN 81 MG PO CHEW
324.0000 mg | CHEWABLE_TABLET | Freq: Once | ORAL | Status: AC
  Administered 2015-05-27: 324 mg via ORAL
  Filled 2015-05-27: qty 4

## 2015-05-27 MED ORDER — INSULIN ASPART 100 UNIT/ML ~~LOC~~ SOLN
0.0000 [IU] | Freq: Three times a day (TID) | SUBCUTANEOUS | Status: DC
  Administered 2015-05-28 (×2): 2 [IU] via SUBCUTANEOUS
  Administered 2015-05-28: 1 [IU] via SUBCUTANEOUS
  Administered 2015-05-29 (×2): 2 [IU] via SUBCUTANEOUS
  Administered 2015-05-29: 3 [IU] via SUBCUTANEOUS
  Administered 2015-05-30 – 2015-05-31 (×5): 2 [IU] via SUBCUTANEOUS

## 2015-05-27 MED ORDER — METOCLOPRAMIDE HCL 10 MG PO TABS
10.0000 mg | ORAL_TABLET | Freq: Three times a day (TID) | ORAL | Status: DC
  Administered 2015-05-28: 10 mg via ORAL
  Filled 2015-05-27 (×3): qty 1

## 2015-05-27 MED ORDER — LINAGLIPTIN 5 MG PO TABS
5.0000 mg | ORAL_TABLET | Freq: Every day | ORAL | Status: DC
  Administered 2015-05-28 – 2015-05-31 (×4): 5 mg via ORAL
  Filled 2015-05-27 (×4): qty 1

## 2015-05-27 MED ORDER — SODIUM CHLORIDE 0.9 % IV BOLUS (SEPSIS)
500.0000 mL | Freq: Once | INTRAVENOUS | Status: AC
  Administered 2015-05-27: 500 mL via INTRAVENOUS

## 2015-05-27 MED ORDER — APIXABAN 5 MG PO TABS
5.0000 mg | ORAL_TABLET | Freq: Two times a day (BID) | ORAL | Status: DC
Start: 2015-05-27 — End: 2015-05-28
  Administered 2015-05-27: 5 mg via ORAL
  Filled 2015-05-27: qty 1

## 2015-05-27 MED ORDER — ATORVASTATIN CALCIUM 40 MG PO TABS
40.0000 mg | ORAL_TABLET | Freq: Every day | ORAL | Status: DC
  Administered 2015-05-28 – 2015-05-30 (×3): 40 mg via ORAL
  Filled 2015-05-27 (×3): qty 1

## 2015-05-27 MED ORDER — CLONIDINE HCL 0.1 MG PO TABS: 0.1000 mg | ORAL_TABLET | Freq: Every day | ORAL | Status: DC

## 2015-05-27 MED ORDER — SODIUM CHLORIDE 0.9 % IV SOLN
INTRAVENOUS | Status: DC
  Administered 2015-05-27: 23:00:00 via INTRAVENOUS

## 2015-05-27 MED ORDER — AMIODARONE HCL 200 MG PO TABS
400.0000 mg | ORAL_TABLET | Freq: Two times a day (BID) | ORAL | Status: DC
  Administered 2015-05-27: 400 mg via ORAL
  Filled 2015-05-27 (×2): qty 2

## 2015-05-27 MED ORDER — SODIUM CHLORIDE 0.9 % IV SOLN
INTRAVENOUS | Status: DC
  Administered 2015-05-27: 21:00:00 via INTRAVENOUS

## 2015-05-27 MED ORDER — CITALOPRAM HYDROBROMIDE 20 MG PO TABS
20.0000 mg | ORAL_TABLET | Freq: Every day | ORAL | Status: DC
  Administered 2015-05-28: 20 mg via ORAL
  Filled 2015-05-27: qty 1

## 2015-05-27 MED ORDER — DICYCLOMINE HCL 10 MG PO CAPS
10.0000 mg | ORAL_CAPSULE | Freq: Four times a day (QID) | ORAL | Status: DC
  Administered 2015-05-27 – 2015-05-31 (×13): 10 mg via ORAL
  Filled 2015-05-27 (×13): qty 1

## 2015-05-27 MED ORDER — MIDODRINE HCL 5 MG PO TABS
5.0000 mg | ORAL_TABLET | Freq: Three times a day (TID) | ORAL | Status: DC
  Administered 2015-05-28 – 2015-05-29 (×6): 5 mg via ORAL
  Filled 2015-05-27 (×6): qty 1

## 2015-05-27 MED ORDER — PANTOPRAZOLE SODIUM 40 MG PO TBEC
40.0000 mg | DELAYED_RELEASE_TABLET | Freq: Two times a day (BID) | ORAL | Status: DC
  Administered 2015-05-27 – 2015-05-31 (×8): 40 mg via ORAL
  Filled 2015-05-27 (×8): qty 1

## 2015-05-27 MED ORDER — HYDROCODONE-ACETAMINOPHEN 5-325 MG PO TABS
1.0000 | ORAL_TABLET | Freq: Four times a day (QID) | ORAL | Status: DC | PRN
  Administered 2015-05-27 – 2015-05-28 (×2): 1 via ORAL
  Filled 2015-05-27 (×2): qty 1

## 2015-05-27 MED ORDER — SUCRALFATE 1 G PO TABS
1.0000 g | ORAL_TABLET | Freq: Four times a day (QID) | ORAL | Status: DC
  Administered 2015-05-27 – 2015-05-31 (×13): 1 g via ORAL
  Filled 2015-05-27 (×13): qty 1

## 2015-05-27 NOTE — Consult Note (Signed)
Cardiology Consult Note Alvester Chou, NP No ref. provider found  Reason for consult: Bradycardia  History of Present Illness (and review of medical records): [name] is a 59 y.o. female with history significant for recent paroxysmal atrial fibrillation, TIA, DM, HLD, HTN, hypothyroidism, recent admissions for abdominal pain who presents to the ED with fatigue.  Patient states that he felt unsteady on her feet and had been having recent falls at home witnessed by her husband.  She was just d/c from hospital on 9/11 for syncope.  She reports heart burn over the past several weeks.  Mid sternal 8/10 for which she has been taking Tums.  She states this is different from her abdominal pain.  She reports shortness of breath and palpitations.  Heart burn has been daily lately.  On arrival to ED, pt was noted to be bradycardic.  She had Istat troponin of 0.19.  Cardiology was consulted for assistance with management.  Review of Systems Denies current abdominal pain, N/V/D. Further review of systems was negative other than stated in HPI.  Echo 05/20/2015 Study Conclusions - Left ventricle: The cavity size was normal. Wall thickness was normal. Systolic function was normal. The estimated ejection fraction was in the range of 60% to 65%. Wall motion was normal; there were no regional wall motion abnormalities. Doppler parameters are consistent with abnormal left ventricular relaxation (grade 1 diastolic dysfunction).  Past Medical History  Diagnosis Date  . Type 1 diabetes   . Hypothyroid   . Degenerative disk disease   . Stomach ulcer   . Diverticulitis   . Syncope 01/2015  . Anxiety   . GERD (gastroesophageal reflux disease)   . History of hiatal hernia   . Cancer     HX OF CANCER OF UTERUS   . TIA (transient ischemic attack) 02/2015  . Hypertension   . Pancreatitis   . PAF (paroxysmal atrial fibrillation)     a. new onset 03/2015 in setting of intractable N/V; b. on Eliquis  5 mg bid; c. CHADSVASc at least 4 (DM, TIA x 2, female)    Past Surgical History  Procedure Laterality Date  . Hernia repair    . Abdominal hysterectomy    . Cholecystectomy    . Esophagogastroduodenoscopy N/A 04/04/2015    Procedure: ESOPHAGOGASTRODUODENOSCOPY (EGD);  Surgeon: Hulen Luster, MD;  Location: [facility] ENDOSCOPY;  Service: Endoscopy;  Laterality: N/A;    Prescriptions prior to admission  Medication Sig Dispense Refill Last Dose  . amiodarone (PACERONE) 400 MG tablet Take 1 tablet (400 mg total) by mouth 2 (two) times daily. 30 tablet 0 05/27/2015 at Unknown time  . apixaban (ELIQUIS) 5 MG TABS tablet Take 1 tablet (5 mg total) by mouth 2 (two) times daily. 60 tablet 0 05/26/2015 at Unknown time  . atorvastatin (LIPITOR) 40 MG tablet Take 1 tablet (40 mg total) by mouth daily at 6 PM. 30 tablet 0 05/26/2015 at Unknown time  . citalopram (CELEXA) 20 MG tablet Take 1 tablet by mouth daily.   05/27/2015 at Unknown time  . cloNIDine (CATAPRES) 0.1 MG tablet Take 1 tablet (0.1 mg total) by mouth 3 (three) times daily as needed. (Patient taking differently: Take 0.1 mg by mouth daily. ) 12 tablet 0 05/27/2015 at Unknown time  . collagenase (SANTYL) ointment Apply topically daily. 15 g 0 unknown  . dicyclomine (BENTYL) 10 MG capsule Take 1 capsule by mouth 4 (four) times daily.   05/27/2015 at Unknown time  . diltiazem (CARDIZEM  CD) 120 MG 24 hr capsule Take 1 capsule (120 mg total) by mouth daily. (Patient taking differently: Take 120 mg by mouth at bedtime. ) 30 capsule 0 05/26/2015 at Unknown time  . diphenhydrAMINE (BENADRYL) 25 mg capsule Take 2 capsules (50 mg total) by mouth every 6 (six) hours as needed. (Patient taking differently: Take 50 mg by mouth every 6 (six) hours as needed for itching or allergies. ) 60 capsule 0 05/27/2015 at Unknown time  . HYDROcodone-acetaminophen (NORCO/VICODIN) 5-325 MG per tablet Take 1 tablet by mouth every 6 (six) hours as needed for moderate pain.   05/27/2015 at  Unknown time  . JANUVIA 100 MG tablet Take 100 mg by mouth daily.   05/27/2015 at Unknown time  . lisinopril (PRINIVIL,ZESTRIL) 5 MG tablet Take 1 tablet by mouth daily.   05/27/2015 at Unknown time  . metFORMIN (GLUCOPHAGE) 1000 MG tablet Take 1 tablet by mouth 2 (two) times daily.   05/27/2015 at Unknown time  . metoCLOPramide (REGLAN) 10 MG tablet Take 1 tablet (10 mg total) by mouth 4 (four) times daily -  before meals and at bedtime. 60 tablet 0 05/27/2015 at Unknown time  . metoprolol succinate (TOPROL-XL) 50 MG 24 hr tablet Take 1 tablet (50 mg total) by mouth daily. 60 tablet 1 05/27/2015 at 800  . midodrine (PROAMATINE) 5 MG tablet Take 1 tablet (5 mg total) by mouth 3 (three) times daily with meals. 90 tablet 0 05/26/2015 at Unknown time  . morphine (MS CONTIN) 100 MG 12 hr tablet Take 100 mg by mouth 2 (two) times daily.    05/27/2015 at Unknown time  . pantoprazole (PROTONIX) 40 MG tablet Take 1 tablet (40 mg total) by mouth 2 (two) times daily. 60 tablet 0 05/26/2015 at Unknown time  . sucralfate (CARAFATE) 1 G tablet Take 1 tablet (1 g total) by mouth 4 (four) times daily. 30 tablet 0 05/26/2015 at Unknown time  . tiZANidine (ZANAFLEX) 4 MG tablet Take 1 tablet by mouth every 6 (six) hours as needed for muscle spasms.    05/27/2015 at Unknown time  . zolpidem (AMBIEN) 5 MG tablet Take 1 tablet by mouth at bedtime.   05/26/2015 at Unknown time  . loperamide (IMODIUM A-D) 2 MG tablet Take 2 tablets (4 mg total) by mouth 4 (four) times daily as needed for diarrhea or loose stools. (Patient not taking: Reported on 04/13/2015) 30 tablet 0 Not Taking at Unknown time  . ondansetron (ZOFRAN ODT) 8 MG disintegrating tablet Take 1 tablet (8 mg total) by mouth every 8 (eight) hours as needed for nausea or vomiting. (Patient not taking: Reported on 05/27/2015) 20 tablet 0 Not Taking at Unknown time   Allergies  Allergen Reactions  . Erythromycin Base Other (See Comments)    Reaction:  Fever   . Rosiglitazone  Maleate Swelling  . Codeine Sulfate Rash  . Tetanus-Diphtheria Toxoids Td Rash and Other (See Comments)    Reaction:  Fever     Social History  Substance Use Topics  . Smoking status: Never Smoker   . Smokeless tobacco: Never Used  . Alcohol Use: No    Family History  Problem Relation Age of Onset  . Hypertension Mother   . CAD Sister      Objective:  Patient Vitals for the past 8 hrs:  BP Temp Temp src Pulse Resp SpO2 Height Weight  05/27/15 2300 - - - - - - 5' 3"  (1.6 m) 59.557 kg (131 lb 4.8 oz)  05/27/15 2254 (!) 143/66 mmHg 98.4 F (36.9 C) Oral (!) 50 - - - -  05/27/15 2145 135/70 mmHg - - (!) 53 15 98 % - -  05/27/15 2130 141/89 mmHg - - (!) 48 25 99 % - -  05/27/15 2119 154/59 mmHg - - (!) 43 24 99 % - -  05/27/15 2045 159/79 mmHg - - (!) 51 13 99 % - -  05/27/15 2030 162/70 mmHg - - (!) 45 19 99 % - -  05/27/15 2000 157/72 mmHg - - (!) 44 22 99 % - -  05/27/15 1959 - 98.3 F (36.8 C) - - - - - -  05/27/15 1930 160/80 mmHg - - (!) 48 20 100 % - -  05/27/15 1900 167/69 mmHg - - (!) 45 19 100 % - -  05/27/15 1830 171/75 mmHg - - (!) 42 22 98 % - -  05/27/15 1800 166/72 mmHg - - (!) 43 22 99 % - -  05/27/15 1730 163/92 mmHg - - (!) 44 18 100 % - -  05/27/15 1713 163/92 mmHg - - - - - - -  05/27/15 1700 183/75 mmHg - - (!) 37 22 100 % - -   General appearance: alert, cooperative, appears older than stated age and no distress Head: Normocephalic, without obvious abnormality, atraumatic Eyes: PERRL, EOM's intact.  Neck:  supple Lungs: clear to auscultation bilaterally Chest wall: no tenderness Heart: regular rate and rhythm, S1, S2 normal Abdomen: soft, non-tender; bowel sounds normal Extremities: extremities normal, atraumatic, no edema Pulses: 2+ and symmetric Neurologic: Grossly normal  Results for orders placed or performed during the hospital encounter of 05/27/15 (from the past 48 hour(s))  CBC with Differential/Platelet     Status: Abnormal   Collection  Time: 05/27/15  4:57 PM  Result Value Ref Range   WBC 8.5 4.0 - 10.5 K/uL   RBC 4.14 3.87 - 5.11 MIL/uL   Hemoglobin 13.4 12.0 - 15.0 g/dL   HCT 38.3 36.0 - 46.0 %   MCV 92.5 78.0 - 100.0 fL   MCH 32.4 26.0 - 34.0 pg   MCHC 35.0 30.0 - 36.0 g/dL   RDW 12.4 11.5 - 15.5 %   Platelets 149 (L) 150 - 400 K/uL   Neutrophils Relative % 81 %   Neutro Abs 6.9 1.7 - 7.7 K/uL   Lymphocytes Relative 12 %   Lymphs Abs 1.0 0.7 - 4.0 K/uL   Monocytes Relative 6 %   Monocytes Absolute 0.5 0.1 - 1.0 K/uL   Eosinophils Relative 1 %   Eosinophils Absolute 0.1 0.0 - 0.7 K/uL   Basophils Relative 0 %   Basophils Absolute 0.0 0.0 - 0.1 K/uL  Basic metabolic panel     Status: Abnormal   Collection Time: 05/27/15  4:57 PM  Result Value Ref Range   Sodium 132 (L) 135 - 145 mmol/L   Potassium 4.6 3.5 - 5.1 mmol/L    Comment: SLIGHT HEMOLYSIS   Chloride 97 (L) 101 - 111 mmol/L   CO2 24 22 - 32 mmol/L   Glucose, Bld 266 (H) 65 - 99 mg/dL   BUN 14 6 - 20 mg/dL   Creatinine, Ser 1.05 (H) 0.44 - 1.00 mg/dL   Calcium 9.2 8.9 - 10.3 mg/dL   GFR calc non Af Amer 56 (L) >60 mL/min   GFR calc Af Amer >60 >60 mL/min    Comment: (NOTE) The eGFR has been calculated using the CKD EPI equation. This  calculation has not been validated in all clinical situations. eGFR's persistently <60 mL/min signify possible Chronic Kidney Disease.    Anion gap 11 5 - 15  I-Stat CG4 Lactic Acid, ED     Status: None   Collection Time: 05/27/15  5:12 PM  Result Value Ref Range   Lactic Acid, Venous 1.89 0.5 - 2.0 mmol/L  TSH     Status: Abnormal   Collection Time: 05/27/15  5:40 PM  Result Value Ref Range   TSH 4.609 (H) 0.350 - 4.500 uIU/mL  POC CBG, ED     Status: Abnormal   Collection Time: 05/27/15  6:35 PM  Result Value Ref Range   Glucose-Capillary 249 (H) 65 - 99 mg/dL  Urinalysis, Routine w reflex microscopic (not at [facility])     Status: Abnormal   Collection Time: 05/27/15  7:36 PM  Result Value Ref Range    Color, Urine YELLOW YELLOW   APPearance CLEAR CLEAR   Specific Gravity, Urine 1.013 1.005 - 1.030   pH 7.0 5.0 - 8.0   Glucose, UA 250 (A) NEGATIVE mg/dL   Hgb urine dipstick SMALL (A) NEGATIVE   Bilirubin Urine NEGATIVE NEGATIVE   Ketones, ur 15 (A) NEGATIVE mg/dL   Protein, ur NEGATIVE NEGATIVE mg/dL   Urobilinogen, UA 0.2 0.0 - 1.0 mg/dL   Nitrite NEGATIVE NEGATIVE   Leukocytes, UA NEGATIVE NEGATIVE  Urine microscopic-add on     Status: Abnormal   Collection Time: 05/27/15  7:36 PM  Result Value Ref Range   Squamous Epithelial / LPF FEW (A) RARE   WBC, UA 0-2 <3 WBC/hpf   RBC / HPF 7-10 <3 RBC/hpf   Bacteria, UA RARE RARE  Hepatic function panel     Status: Abnormal   Collection Time: 05/27/15  8:04 PM  Result Value Ref Range   Total Protein 6.1 (L) 6.5 - 8.1 g/dL   Albumin 3.2 (L) 3.5 - 5.0 g/dL   AST 30 15 - 41 U/L   ALT 29 14 - 54 U/L   Alkaline Phosphatase 71 38 - 126 U/L   Total Bilirubin 1.0 0.3 - 1.2 mg/dL   Bilirubin, Direct 0.3 0.1 - 0.5 mg/dL   Indirect Bilirubin 0.7 0.3 - 0.9 mg/dL  I-stat troponin, ED     Status: Abnormal   Collection Time: 05/27/15  8:05 PM  Result Value Ref Range   Troponin i, poc 0.19 (HH) 0.00 - 0.08 ng/mL   Comment NOTIFIED PHYSICIAN    Comment 3            Comment: Due to the release kinetics of cTnI, a negative result within the first hours of the onset of symptoms does not rule out myocardial infarction with certainty. If myocardial infarction is still suspected, repeat the test at appropriate intervals.   Ammonia     Status: None   Collection Time: 05/27/15  8:05 PM  Result Value Ref Range   Ammonia 17 9 - 35 umol/L  I-Stat CG4 Lactic Acid, ED     Status: None   Collection Time: 05/27/15  8:06 PM  Result Value Ref Range   Lactic Acid, Venous 1.26 0.5 - 2.0 mmol/L  Glucose, capillary     Status: Abnormal   Collection Time: 05/27/15 11:20 PM  Result Value Ref Range   Glucose-Capillary 206 (H) 65 - 99 mg/dL   Ct Head Wo  Contrast  05/27/2015   CLINICAL DATA:  59 year old female with fall  EXAM: CT HEAD WITHOUT CONTRAST  CT  CERVICAL SPINE WITHOUT CONTRAST  TECHNIQUE: Multidetector CT imaging of the head and cervical spine was performed following the standard protocol without intravenous contrast. Multiplanar CT image reconstructions of the cervical spine were also generated.  COMPARISON:  None.  FINDINGS: CT HEAD FINDINGS  The ventricles and sulci are appropriate in size for patient's age. Mild periventricular and deep white matter hypodensities represent chronic microvascular ischemic changes. There is no intracranial hemorrhage. No mass effect or midline shift identified.  The visualized paranasal sinuses and mastoid air cells are well aerated. The calvarium is intact.  CT CERVICAL SPINE FINDINGS  There is no acute fracture or subluxation of the cervical spine.Mild multilevel degenerative changes.The odontoid and spinous processes are intact.There is normal anatomic alignment of the C1-C2 lateral masses. The visualized soft tissues appear unremarkable.  IMPRESSION: No acute intracranial hemorrhage.  Mild chronic microvascular ischemic changes.  No acute/traumatic cervical spine pathology.   Electronically Signed   By: Anner Crete M.D.   On: 05/27/2015 18:23   Ct Cervical Spine Wo Contrast  05/27/2015   CLINICAL DATA:  59 year old female with fall  EXAM: CT HEAD WITHOUT CONTRAST  CT CERVICAL SPINE WITHOUT CONTRAST  TECHNIQUE: Multidetector CT imaging of the head and cervical spine was performed following the standard protocol without intravenous contrast. Multiplanar CT image reconstructions of the cervical spine were also generated.  COMPARISON:  None.  FINDINGS: CT HEAD FINDINGS  The ventricles and sulci are appropriate in size for patient's age. Mild periventricular and deep white matter hypodensities represent chronic microvascular ischemic changes. There is no intracranial hemorrhage. No mass effect or midline shift  identified.  The visualized paranasal sinuses and mastoid air cells are well aerated. The calvarium is intact.  CT CERVICAL SPINE FINDINGS  There is no acute fracture or subluxation of the cervical spine.Mild multilevel degenerative changes.The odontoid and spinous processes are intact.There is normal anatomic alignment of the C1-C2 lateral masses. The visualized soft tissues appear unremarkable.  IMPRESSION: No acute intracranial hemorrhage.  Mild chronic microvascular ischemic changes.  No acute/traumatic cervical spine pathology.   Electronically Signed   By: Anner Crete M.D.   On: 05/27/2015 18:23   Dg Chest Portable 1 View  05/27/2015   CLINICAL DATA:  Fall today  EXAM: PORTABLE CHEST - 1 VIEW  COMPARISON:  05/18/2015  FINDINGS: Cardiomediastinal silhouette is stable. No infiltrate or pulmonary edema. Mild basilar atelectasis. There is no pneumothorax. No gross fractures are noted.  IMPRESSION: No active disease.   Electronically Signed   By: Lahoma Crocker M.D.   On: 05/27/2015 20:14    ECG:  Hr 40, marked sinus bradycardia, prolonged QT, anterior infarct likely old. Similar to last ekg 9/7  Impression: ACS Bradycardia Paroxsymal Afib on anticoagulation, currently sinus HTN HLD DM Hypothyroidism  Recommendations: --Close monitoring on telemetry --EKG in am, prn for chest pain or arrhythmia --Trend cardiac biomarkers --Medical management with ASA, ACEi, Statin, Nitro prn --Heparin gtt for ACS per pharmacy, Hold Eliquis --Hold BB, CCB and amiodarone at this time given marked bradycardia --Hold metformin --Had recent Echo few days prior --No prior stress testing or Cath.  Will reassess for further ischemic evaluation pending on initial studies and clinical course.   Thank you for this consult.  We will follow along with you and make further recommendations as indicated.

## 2015-05-27 NOTE — H&P (Signed)
[facility] History and Physical  [name] IDP:[id number] DOB: Jul 08, 1956 DOA: 05/27/2015  Referring physician: Cherly Beach. PCP: Alvester Chou, NP  Specialists: None.  Chief Complaint: Lethargy and chest pain.  HPI: [name] is a 59 y.o. female with history of paroxysmal atrial fibrillation, hypertension, hyperlipidemia, diabetes mellitus type 2, was brought to the ER after patient's family noticed patient beginning in increasingly lethargic and frequent falls. Patient was found to be bradycardic with heart rate in the 40s, sinus bradycardia. In the ER patient was complaining of some chest pain retrosternal pressure-like which is also partially reproducible on palpation. Patient states the chest pain has been on for last 3 days. Denies nausea vomiting or diarrhea. Patient has been admitted for further management of her mental status changes and chest pain. Patient also has chronic epigastric pain and during last admission was found to have nonobstructive intussusception. Patient was recently admitted for syncope most likely secondary to bradycardia and pain relief medications. During which 2-D echo done showed EF of 60-65% with grade 1 diastolic dysfunction. On my exam patient still has persistent chest pain and and is mildly lethargic but answers questions appropriately and follows commands.  Review of Systems: As presented in the history of presenting illness, rest negative.  Past Medical History  Diagnosis Date  . Type 1 diabetes   . Hypothyroid   . Degenerative disk disease   . Stomach ulcer   . Diverticulitis   . Syncope 01/2015  . Anxiety   . GERD (gastroesophageal reflux disease)   . History of hiatal hernia   . Cancer     HX OF CANCER OF UTERUS   . TIA (transient ischemic attack) 02/2015  . Hypertension   . Pancreatitis   . PAF (paroxysmal atrial fibrillation)     a. new onset 03/2015 in setting of intractable N/V; b. on Eliquis 5 mg bid; c. CHADSVASc at least 4  (DM, TIA x 2, female)   Past Surgical History  Procedure Laterality Date  . Hernia repair    . Abdominal hysterectomy    . Cholecystectomy    . Esophagogastroduodenoscopy N/A 04/04/2015    Procedure: ESOPHAGOGASTRODUODENOSCOPY (EGD);  Surgeon: Hulen Luster, MD;  Location: [facility] ENDOSCOPY;  Service: Endoscopy;  Laterality: N/A;   Social History:  reports that she has never smoked. She has never used smokeless tobacco. She reports that she does not drink alcohol or use illicit drugs. Where does patient live home. Can patient participate in ADLs? Yes.  Allergies  Allergen Reactions  . Erythromycin Base Other (See Comments)    Reaction:  Fever   . Rosiglitazone Maleate Swelling  . Codeine Sulfate Rash  . Tetanus-Diphtheria Toxoids Td Rash and Other (See Comments)    Reaction:  Fever     Family History:  Family History  Problem Relation Age of Onset  . Hypertension Mother   . CAD Sister       Prior to Admission medications   Medication Sig Start Date End Date Taking? Authorizing Provider  amiodarone (PACERONE) 400 MG tablet Take 1 tablet (400 mg total) by mouth 2 (two) times daily. 04/07/15  Yes Bettey Costa, MD  apixaban (ELIQUIS) 5 MG TABS tablet Take 1 tablet (5 mg total) by mouth 2 (two) times daily. 04/07/15  Yes Bettey Costa, MD  atorvastatin (LIPITOR) 40 MG tablet Take 1 tablet (40 mg total) by mouth daily at 6 PM. 02/17/15  Yes Sital Mody, MD  citalopram (CELEXA) 20 MG tablet Take 1 tablet by  mouth daily. 03/11/15  Yes Historical Provider, MD  cloNIDine (CATAPRES) 0.1 MG tablet Take 1 tablet (0.1 mg total) by mouth 3 (three) times daily as needed. Patient taking differently: Take 0.1 mg by mouth daily.  04/10/15 04/09/16 Yes Carrie Mew, MD  collagenase (SANTYL) ointment Apply topically daily. 04/07/15  Yes Bettey Costa, MD  dicyclomine (BENTYL) 10 MG capsule Take 1 capsule by mouth 4 (four) times daily. 03/11/15  Yes Historical Provider, MD  diltiazem (CARDIZEM CD) 120 MG 24 hr capsule  Take 1 capsule (120 mg total) by mouth daily. Patient taking differently: Take 120 mg by mouth at bedtime.  04/07/15  Yes Bettey Costa, MD  diphenhydrAMINE (BENADRYL) 25 mg capsule Take 2 capsules (50 mg total) by mouth every 6 (six) hours as needed. Patient taking differently: Take 50 mg by mouth every 6 (six) hours as needed for itching or allergies.  05/17/15  Yes Carrie Mew, MD  HYDROcodone-acetaminophen (NORCO/VICODIN) 5-325 MG per tablet Take 1 tablet by mouth every 6 (six) hours as needed for moderate pain.   Yes Historical Provider, MD  JANUVIA 100 MG tablet Take 100 mg by mouth daily. 12/15/14  Yes Historical Provider, MD  lisinopril (PRINIVIL,ZESTRIL) 5 MG tablet Take 1 tablet by mouth daily. 03/15/15  Yes Historical Provider, MD  metFORMIN (GLUCOPHAGE) 1000 MG tablet Take 1 tablet by mouth 2 (two) times daily. 03/11/15  Yes Historical Provider, MD  metoCLOPramide (REGLAN) 10 MG tablet Take 1 tablet (10 mg total) by mouth 4 (four) times daily -  before meals and at bedtime. 05/17/15  Yes Carrie Mew, MD  metoprolol succinate (TOPROL-XL) 50 MG 24 hr tablet Take 1 tablet (50 mg total) by mouth daily. 03/14/15  Yes Henreitta Leber, MD  midodrine (PROAMATINE) 5 MG tablet Take 1 tablet (5 mg total) by mouth 3 (three) times daily with meals. 05/22/15  Yes Thurnell Lose, MD  morphine (MS CONTIN) 100 MG 12 hr tablet Take 100 mg by mouth 2 (two) times daily.    Yes Historical Provider, MD  pantoprazole (PROTONIX) 40 MG tablet Take 1 tablet (40 mg total) by mouth 2 (two) times daily. 04/07/15  Yes Sital Mody, MD  sucralfate (CARAFATE) 1 G tablet Take 1 tablet (1 g total) by mouth 4 (four) times daily. 04/13/15 04/12/16 Yes Loney Hering, MD  tiZANidine (ZANAFLEX) 4 MG tablet Take 1 tablet by mouth every 6 (six) hours as needed for muscle spasms.    Yes Historical Provider, MD  zolpidem (AMBIEN) 5 MG tablet Take 1 tablet by mouth at bedtime.   Yes Historical Provider, MD  loperamide (IMODIUM A-D) 2 MG  tablet Take 2 tablets (4 mg total) by mouth 4 (four) times daily as needed for diarrhea or loose stools. Patient not taking: Reported on 04/13/2015 04/10/15   Carrie Mew, MD  ondansetron [place] ODT) 8 MG disintegrating tablet Take 1 tablet (8 mg total) by mouth every 8 (eight) hours as needed for nausea or vomiting. Patient not taking: Reported on 05/27/2015 04/10/15   Carrie Mew, MD    Physical Exam: Filed Vitals:   05/27/15 2030 05/27/15 2045 05/27/15 2119 05/27/15 2130  BP: 162/70 159/79 154/59 141/89  Pulse: 45 51 43 48  Temp:      Resp: 19 13 24 25   SpO2: 99% 99% 99% 99%     General:  Moderately built and nourished.  Eyes: Anicteric no pallor.  ENT: No discharge from the ears eyes nose and mouth.  Neck: No mass felt.  No JVD appreciated.  Cardiovascular: S1 and S2 heard.  Respiratory: No rhonchi or crepitations.  Abdomen: Soft nontender bowel sounds present.  Skin: No rash.  Musculoskeletal: No edema.  Psychiatric: Mildly lethargic.  Neurologic: Patient is mildly lethargic but answers questions appropriately and is oriented to time place and person. Moves all extremities.  Labs on Admission:  Basic Metabolic Panel:  Recent Labs Lab 05/27/15 1657  NA 132*  K 4.6  CL 97*  CO2 24  GLUCOSE 266*  BUN 14  CREATININE 1.05*  CALCIUM 9.2   Liver Function Tests:  Recent Labs Lab 05/27/15 2004  AST 30  ALT 29  ALKPHOS 71  BILITOT 1.0  PROT 6.1*  ALBUMIN 3.2*   No results for input(s): LIPASE, AMYLASE in the last 168 hours.  Recent Labs Lab 05/27/15 2005  AMMONIA 17   CBC:  Recent Labs Lab 05/27/15 1657  WBC 8.5  NEUTROABS 6.9  HGB 13.4  HCT 38.3  MCV 92.5  PLT 149*   Cardiac Enzymes: No results for input(s): CKTOTAL, CKMB, CKMBINDEX, TROPONINI in the last 168 hours.  BNP (last 3 results)  Recent Labs  01/10/15 2300  BNP 62.5    ProBNP (last 3 results) No results for input(s): PROBNP in the last 8760  hours.  CBG:  Recent Labs Lab 05/22/15 0010 05/22/15 0425 05/22/15 0742 05/22/15 1132 05/27/15 1835  GLUCAP 236* 197* 135* 219* 249*    Radiological Exams on Admission: Ct Head Wo Contrast  05/27/2015   CLINICAL DATA:  58 year old female with fall  EXAM: CT HEAD WITHOUT CONTRAST  CT CERVICAL SPINE WITHOUT CONTRAST  TECHNIQUE: Multidetector CT imaging of the head and cervical spine was performed following the standard protocol without intravenous contrast. Multiplanar CT image reconstructions of the cervical spine were also generated.  COMPARISON:  None.  FINDINGS: CT HEAD FINDINGS  The ventricles and sulci are appropriate in size for patient's age. Mild periventricular and deep white matter hypodensities represent chronic microvascular ischemic changes. There is no intracranial hemorrhage. No mass effect or midline shift identified.  The visualized paranasal sinuses and mastoid air cells are well aerated. The calvarium is intact.  CT CERVICAL SPINE FINDINGS  There is no acute fracture or subluxation of the cervical spine.Mild multilevel degenerative changes.The odontoid and spinous processes are intact.There is normal anatomic alignment of the C1-C2 lateral masses. The visualized soft tissues appear unremarkable.  IMPRESSION: No acute intracranial hemorrhage.  Mild chronic microvascular ischemic changes.  No acute/traumatic cervical spine pathology.   Electronically Signed   By: Anner Crete M.D.   On: 05/27/2015 18:23   Ct Cervical Spine Wo Contrast  05/27/2015   CLINICAL DATA:  59 year old female with fall  EXAM: CT HEAD WITHOUT CONTRAST  CT CERVICAL SPINE WITHOUT CONTRAST  TECHNIQUE: Multidetector CT imaging of the head and cervical spine was performed following the standard protocol without intravenous contrast. Multiplanar CT image reconstructions of the cervical spine were also generated.  COMPARISON:  None.  FINDINGS: CT HEAD FINDINGS  The ventricles and sulci are appropriate in size  for patient's age. Mild periventricular and deep white matter hypodensities represent chronic microvascular ischemic changes. There is no intracranial hemorrhage. No mass effect or midline shift identified.  The visualized paranasal sinuses and mastoid air cells are well aerated. The calvarium is intact.  CT CERVICAL SPINE FINDINGS  There is no acute fracture or subluxation of the cervical spine.Mild multilevel degenerative changes.The odontoid and spinous processes are intact.There is normal anatomic alignment of the C1-C2  lateral masses. The visualized soft tissues appear unremarkable.  IMPRESSION: No acute intracranial hemorrhage.  Mild chronic microvascular ischemic changes.  No acute/traumatic cervical spine pathology.   Electronically Signed   By: Anner Crete M.D.   On: 05/27/2015 18:23   Dg Chest Portable 1 View  05/27/2015   CLINICAL DATA:  Fall today  EXAM: PORTABLE CHEST - 1 VIEW  COMPARISON:  05/18/2015  FINDINGS: Cardiomediastinal silhouette is stable. No infiltrate or pulmonary edema. Mild basilar atelectasis. There is no pneumothorax. No gross fractures are noted.  IMPRESSION: No active disease.   Electronically Signed   By: Lahoma Crocker M.D.   On: 05/27/2015 20:14    EKG: Independently reviewed. Sinus bradycardia with nonspecific ST changes specifically in the inferior leads.  Assessment/Plan Principal Problem:   Acute encephalopathy Active Problems:   Chronic pain syndrome   Paroxysmal atrial fibrillation   Diabetes type 2, uncontrolled   Symptomatic bradycardia   Elevated troponin   1. Acute encephalopathy - suspect most likely secondary to medications. At this time patient appears nonfocal. Check ammonia levels. I'm holding off patient's Tizanidine and Ambien. Patient is on large doses of morphine. Check ABG for any carbon dioxide retention. 2. Chest pain with elevated troponin concerning for ACS. - Cardiology has been consulted. Patient will be placed on heparin. Patient is  on Apixaban for A. fib which will be held in anticipation of possible cardiac procedures. Patient will be placed on aspirin. Since patient has significant bradycardia patient's beta blockers Cardizem and amiodarone is on hold. When necessary nitroglycerin. 3. Sinus bradycardia with history of recurrent syncope - beta blockers Cardizem and amiodarone hold. Patient is also on clonidine which may need to be held if patient has persistent bradycardia. Check TSH. 4. Diabetes mellitus type 2 - will continue Januvia. Sliding scale coverage. Hold metformin while inpatient. 5. Chronic pain syndrome - see #1. 6. Hypertension - holding off Cardizem and beta blockers due to bradycardia. 7. Paroxysmal atrial fibrillation - chads 2 vasc score is more than 2 and patient is on heparin presently. Holding off Apixaban for possible cardiac procedure. Patient's vitamin medications on hold including beta blocker Cardizem and amiodarone due to sinus bradycardia.  I have reviewed patient's old charts of labs. Personally reviewed chest x-ray and EKG.   DVT Prophylaxis heparin infusion.  Code Status: Full code.  Family Communication: Discussed with patient.  Disposition Plan: Admit to inpatient.    Mikita Lesmeister N. [facility] Pager [phone].  If 7PM-7AM, please contact night-coverage [email or web address] Password [facility] 05/27/2015, 9:53 PM

## 2015-05-27 NOTE — ED Provider Notes (Signed)
CSN: [id number]     Arrival date & time 05/27/15  1629 History   First MD Initiated Contact with Patient 05/27/15 1629     Chief Complaint  Patient presents with  . Fatigue     (Consider location/radiation/quality/duration/timing/severity/associated sxs/prior Treatment) HPI Comments: 59 year old female with diabetes, chronic pain, depression, reflux, orthostatic syncope, high blood pressure, atrial fibrillation, narcotic abuse, electrolyte abnormalities presents with general weakness worsening for the past few weeks. Per report patient has had it for up to 8 weeks however patient says worsening the past few days in the past week. Bradycardic on route and on arrival to the ER heart rate 30s and 40s. Patient denies any recent medication changes or extra dosing however patient does have mild confusion on exam. No unilateral stroke symptoms per report. Nonsmoker. Patient had reported fall with unknown details of head injury or loss of consciousness recently. Patient also fell in the ER.  The history is provided by the patient, medical records and the EMS personnel.    Past Medical History  Diagnosis Date  . Type 1 diabetes   . Hypothyroid   . Degenerative disk disease   . Stomach ulcer   . Diverticulitis   . Syncope 01/2015  . Anxiety   . GERD (gastroesophageal reflux disease)   . History of hiatal hernia   . Cancer     HX OF CANCER OF UTERUS   . TIA (transient ischemic attack) 02/2015  . Hypertension   . Pancreatitis   . PAF (paroxysmal atrial fibrillation) 03/2015    a. new onset 03/2015 in setting of intractable N/V; b. on Eliquis 5 mg bid; c. CHADSVASc 4 (DM, TIA x 2, female)  . Intussusception intestine 05/2015   Past Surgical History  Procedure Laterality Date  . Hernia repair    . Abdominal hysterectomy    . Cholecystectomy    . Esophagogastroduodenoscopy N/A 04/04/2015    Procedure: ESOPHAGOGASTRODUODENOSCOPY (EGD);  Surgeon: Hulen Luster, MD;  Location: [facility] ENDOSCOPY;   Service: Endoscopy;  Laterality: N/A;   Family History  Problem Relation Age of Onset  . Hypertension Mother   . CAD Sister    Social History  Substance Use Topics  . Smoking status: Never Smoker   . Smokeless tobacco: Never Used  . Alcohol Use: No   OB History    No data available     Review of Systems  Unable to perform ROS Constitutional: Negative for fever and chills.  HENT: Negative for congestion.   Eyes: Negative for visual disturbance.  Respiratory: Negative for shortness of breath.   Cardiovascular: Negative for chest pain.  Gastrointestinal: Negative for vomiting and abdominal pain.  Genitourinary: Negative for dysuria and flank pain.  Musculoskeletal: Negative for back pain, neck pain and neck stiffness.  Skin: Negative for rash.  Neurological: Negative for light-headedness and headaches.      Allergies  Erythromycin base; Rosiglitazone maleate; Codeine sulfate; and Tetanus-diphtheria toxoids td  Home Medications   Prior to Admission medications   Medication Sig Start Date End Date Taking? Authorizing Provider  apixaban (ELIQUIS) 5 MG TABS tablet Take 1 tablet (5 mg total) by mouth 2 (two) times daily. 04/07/15  Yes Bettey Costa, MD  atorvastatin (LIPITOR) 40 MG tablet Take 1 tablet (40 mg total) by mouth daily at 6 PM. 02/17/15  Yes Sital Mody, MD  dicyclomine (BENTYL) 10 MG capsule Take 1 capsule by mouth 4 (four) times daily. 03/11/15  Yes Historical Provider, MD  JANUVIA 100 MG tablet Take  100 mg by mouth daily. 12/15/14  Yes Historical Provider, MD  lisinopril (PRINIVIL,ZESTRIL) 5 MG tablet Take 1 tablet by mouth daily. 03/15/15  Yes Historical Provider, MD  metFORMIN (GLUCOPHAGE) 1000 MG tablet Take 1 tablet by mouth 2 (two) times daily. 03/11/15  Yes Historical Provider, MD  pantoprazole (PROTONIX) 40 MG tablet Take 1 tablet (40 mg total) by mouth 2 (two) times daily. 04/07/15  Yes Sital Mody, MD  sucralfate (CARAFATE) 1 G tablet Take 1 tablet (1 g total) by mouth 4  (four) times daily. 04/13/15 04/12/16 Yes Loney Hering, MD  amiodarone (PACERONE) 200 MG tablet Take 1 tablet (200 mg total) by mouth daily. 05/31/15   Geradine Girt, DO  cefUROXime (CEFTIN) 500 MG tablet Take 1 tablet (500 mg total) by mouth 2 (two) times daily with a meal. 05/31/15   Geradine Girt, DO  lactulose (CHRONULAC) 10 GM/15ML solution Take 30 mLs (20 g total) by mouth 2 (two) times daily as needed for mild constipation. 05/31/15   Geradine Girt, DO  metoprolol succinate (TOPROL-XL) 25 MG 24 hr tablet Take 1 tablet (25 mg total) by mouth daily. 05/31/15   Geradine Girt, DO  midodrine (PROAMATINE) 10 MG tablet Take 1 tablet (10 mg total) by mouth 3 (three) times daily with meals. 05/31/15   Geradine Girt, DO  morphine (MS CONTIN) 60 MG 12 hr tablet Take 1 tablet (60 mg total) by mouth 2 (two) times daily. 05/31/15   Jessica U Vann, DO   BP 172/77 mmHg  Pulse 73  Temp(Src) 98.5 F (36.9 C) (Oral)  Resp 18  Ht 5' 3"  (1.6 m)  Wt 131 lb 4.8 oz (59.557 kg)  BMI 23.26 kg/m2  SpO2 98% Physical Exam  Constitutional: She appears well-developed and well-nourished.  HENT:  Head: Normocephalic and atraumatic.  Dry mmd  Eyes: Right eye exhibits no discharge. Left eye exhibits no discharge.  Neck: Normal range of motion. Neck supple. No tracheal deviation present.  Cardiovascular: Regular rhythm.  Bradycardia present.   Pulmonary/Chest: Effort normal and breath sounds normal.  Abdominal: Soft. She exhibits no distension. There is no tenderness. There is no guarding.  Musculoskeletal: She exhibits no edema.  Neurological: She is alert. No sensory deficit.  gen weakness, moves ext equal bilateral gen confusion  Skin: Skin is warm. No rash noted.  Psychiatric:  fetigue  Nursing note and vitals reviewed.   ED Course  Procedures (including critical care time) Labs Review Labs Reviewed  C DIFFICILE QUICK SCREEN W PCR REFLEX - Abnormal; Notable for the following:    C Diff antigen  POSITIVE (*)    All other components within normal limits  CBC WITH DIFFERENTIAL/PLATELET - Abnormal; Notable for the following:    Platelets 149 (*)    All other components within normal limits  BASIC METABOLIC PANEL - Abnormal; Notable for the following:    Sodium 132 (*)    Chloride 97 (*)    Glucose, Bld 266 (*)    Creatinine, Ser 1.05 (*)    GFR calc non Af Amer 56 (*)    All other components within normal limits  TSH - Abnormal; Notable for the following:    TSH 4.609 (*)    All other components within normal limits  URINALYSIS, ROUTINE W REFLEX MICROSCOPIC (NOT AT [facility]) - Abnormal; Notable for the following:    Glucose, UA 250 (*)    Hgb urine dipstick SMALL (*)    Ketones, ur 15 (*)  All other components within normal limits  HEPATIC FUNCTION PANEL - Abnormal; Notable for the following:    Total Protein 6.1 (*)    Albumin 3.2 (*)    All other components within normal limits  URINE MICROSCOPIC-ADD ON - Abnormal; Notable for the following:    Squamous Epithelial / LPF FEW (*)    All other components within normal limits  BLOOD GAS, ARTERIAL - Abnormal; Notable for the following:    pCO2 arterial 34.5 (*)    All other components within normal limits  TROPONIN I - Abnormal; Notable for the following:    Troponin I 0.67 (*)    All other components within normal limits  TROPONIN I - Abnormal; Notable for the following:    Troponin I 0.53 (*)    All other components within normal limits  TROPONIN I - Abnormal; Notable for the following:    Troponin I 0.20 (*)    All other components within normal limits  GLUCOSE, CAPILLARY - Abnormal; Notable for the following:    Glucose-Capillary 206 (*)    All other components within normal limits  APTT - Abnormal; Notable for the following:    aPTT 49 (*)    All other components within normal limits  PROTIME-INR - Abnormal; Notable for the following:    Prothrombin Time 24.0 (*)    INR 2.17 (*)    All other components within  normal limits  HEPARIN LEVEL (UNFRACTIONATED) - Abnormal; Notable for the following:    Heparin Unfractionated 1.90 (*)    All other components within normal limits  GLUCOSE, CAPILLARY - Abnormal; Notable for the following:    Glucose-Capillary 138 (*)    All other components within normal limits  GLUCOSE, CAPILLARY - Abnormal; Notable for the following:    Glucose-Capillary 153 (*)    All other components within normal limits  CBC - Abnormal; Notable for the following:    WBC 16.6 (*)    RBC 3.76 (*)    All other components within normal limits  GLUCOSE, CAPILLARY - Abnormal; Notable for the following:    Glucose-Capillary 152 (*)    All other components within normal limits  BASIC METABOLIC PANEL - Abnormal; Notable for the following:    Potassium 3.2 (*)    Glucose, Bld 141 (*)    Creatinine, Ser 1.61 (*)    Calcium 8.4 (*)    GFR calc non Af Amer 33 (*)    GFR calc Af Amer 39 (*)    All other components within normal limits  GLUCOSE, CAPILLARY - Abnormal; Notable for the following:    Glucose-Capillary 156 (*)    All other components within normal limits  GLUCOSE, CAPILLARY - Abnormal; Notable for the following:    Glucose-Capillary 157 (*)    All other components within normal limits  GLUCOSE, CAPILLARY - Abnormal; Notable for the following:    Glucose-Capillary 208 (*)    All other components within normal limits  GLUCOSE, CAPILLARY - Abnormal; Notable for the following:    Glucose-Capillary 172 (*)    All other components within normal limits  CBC - Abnormal; Notable for the following:    WBC 11.9 (*)    RBC 3.57 (*)    Hemoglobin 11.3 (*)    HCT 34.8 (*)    All other components within normal limits  BASIC METABOLIC PANEL - Abnormal; Notable for the following:    Glucose, Bld 168 (*)    Creatinine, Ser 1.33 (*)  Calcium 8.5 (*)    GFR calc non Af Amer 42 (*)    GFR calc Af Amer 49 (*)    All other components within normal limits  GLUCOSE, CAPILLARY -  Abnormal; Notable for the following:    Glucose-Capillary 201 (*)    All other components within normal limits  GLUCOSE, CAPILLARY - Abnormal; Notable for the following:    Glucose-Capillary 156 (*)    All other components within normal limits  GLUCOSE, CAPILLARY - Abnormal; Notable for the following:    Glucose-Capillary 166 (*)    All other components within normal limits  CBC - Abnormal; Notable for the following:    RBC 3.59 (*)    Hemoglobin 11.4 (*)    HCT 34.5 (*)    Platelets 142 (*)    All other components within normal limits  GLUCOSE, CAPILLARY - Abnormal; Notable for the following:    Glucose-Capillary 163 (*)    All other components within normal limits  BASIC METABOLIC PANEL - Abnormal; Notable for the following:    Glucose, Bld 181 (*)    Calcium 8.6 (*)    All other components within normal limits  GLUCOSE, CAPILLARY - Abnormal; Notable for the following:    Glucose-Capillary 196 (*)    All other components within normal limits  GLUCOSE, CAPILLARY - Abnormal; Notable for the following:    Glucose-Capillary 165 (*)    All other components within normal limits  GLUCOSE, CAPILLARY - Abnormal; Notable for the following:    Glucose-Capillary 175 (*)    All other components within normal limits  CBG MONITORING, ED - Abnormal; Notable for the following:    Glucose-Capillary 249 (*)    All other components within normal limits  I-STAT TROPOININ, ED - Abnormal; Notable for the following:    Troponin i, poc 0.19 (*)    All other components within normal limits  URINE CULTURE  MRSA PCR SCREENING  AMMONIA  MAGNESIUM  CORTISOL  I-STAT CG4 LACTIC ACID, ED  I-STAT CG4 LACTIC ACID, ED    Imaging Review No results found. I have personally reviewed and evaluated these images and lab results as part of my medical decision-making.  Date/Time:  Friday May 27 2015 20:43:40 EDT Ventricular Rate:  52 PR Interval:  206 QRS Duration: 90 QT Interval:  570 QTC  Calculation: 530 R Axis:   80 Text Interpretation:  Sinus rhythm Anterior infarct, old Minimal ST  depression, inferior leads Prolonged QT interval   MDM   Final diagnoses:  Abdominal pain  Chest pain  Chronic pain syndrome  Paroxysmal atrial fibrillation  Diabetes type 2, uncontrolled  Symptomatic bradycardia  Acute encephalopathy  Elevated troponin  DEPRESSION/ANXIETY  LUMBAR DISC DISPLACEMENT  Proteinuria  Orthostatic syncope  Dehydration  Essential hypertension  Orthostatic hypotension  Chronically on opiate therapy  Gastroparesis  Syncope and collapse  Atrial fibrillation, new onset  Protein-calorie malnutrition, severe  Epigastric pain  Diabetes type 1, uncontrolled  Demand ischemia  Hypokalemia  Hyperlipidemia with target LDL less than 100  PAF (paroxysmal atrial fibrillation)  Chronic atrial fibrillation  Diabetes mellitus type 2, controlled  Diarrhea  Chronic anemia  Thrombocytopenia  Bradycardia  Prolonged QT interval  Other specified hypotension  Hypomagnesemia  Colitis  Narcotic abuse  Type 2 diabetes mellitus without complication  Other specified hypothyroidism  Abdominal pain, chronic, epigastric  Intussusception  Intussusception intestine   Symptomatic bradycardia. Multifactorial including meds. Confusion, multifactorial.  The patients results and plan were reviewed and discussed.  Any x-rays performed were independently reviewed by myself.   Differential diagnosis were considered with the presenting HPI.  Medications  sodium chloride 0.9 % bolus 500 mL (0 mLs Intravenous Stopped 05/27/15 1849)  aspirin chewable tablet 324 mg (324 mg Oral Given 05/27/15 2039)  sodium chloride 0.9 % bolus 500 mL (500 mLs Intravenous Given 05/28/15 0703)  sodium chloride 0.9 % bolus 1,000 mL (1,000 mLs Intravenous Given 05/28/15 1145)  potassium chloride SA (K-DUR,KLOR-CON) CR tablet 40 mEq (0 mEq Oral Duplicate 8/88/91 6945)  potassium chloride SA  (K-DUR,KLOR-CON) CR tablet 40 mEq (40 mEq Oral Given 05/29/15 1226)  sodium chloride 0.9 % bolus 500 mL (500 mLs Intravenous Given 05/29/15 2106)    Filed Vitals:   05/30/15 1933 05/31/15 0437 05/31/15 0720 05/31/15 1110  BP: 164/65 189/78 183/80 172/77  Pulse: 66 64 73 73  Temp: 98.7 F (37.1 C) 98.1 F (36.7 C) 98.8 F (37.1 C) 98.5 F (36.9 C)  TempSrc: Oral Oral Oral Oral  Resp: 18 16 18 18   Height:      Weight:      SpO2: 96% 98% 95% 98%    Final diagnoses:  Abdominal pain  Chest pain  Chronic pain syndrome  Paroxysmal atrial fibrillation  Diabetes type 2, uncontrolled  Symptomatic bradycardia  Acute encephalopathy  Elevated troponin  DEPRESSION/ANXIETY  LUMBAR DISC DISPLACEMENT  Proteinuria  Orthostatic syncope  Dehydration  Essential hypertension  Orthostatic hypotension  Chronically on opiate therapy  Gastroparesis  Syncope and collapse  Atrial fibrillation, new onset  Protein-calorie malnutrition, severe  Epigastric pain  Diabetes type 1, uncontrolled  Demand ischemia  Hypokalemia  Hyperlipidemia with target LDL less than 100  PAF (paroxysmal atrial fibrillation)  Chronic atrial fibrillation  Diabetes mellitus type 2, controlled  Diarrhea  Chronic anemia  Thrombocytopenia  Bradycardia  Prolonged QT interval  Other specified hypotension  Hypomagnesemia  Colitis  Narcotic abuse  Type 2 diabetes mellitus without complication  Other specified hypothyroidism  Abdominal pain, chronic, epigastric  Intussusception  Intussusception intestine    Admission/ observation were discussed with the admitting physician, patient and/or family and they are comfortable with the plan.     Elnora Morrison, MD 06/02/15 [phone]

## 2015-05-27 NOTE — ED Notes (Signed)
Pt denies chest pain, sob, etc. Denies pain.

## 2015-05-27 NOTE — ED Notes (Signed)
Pt. Refused abdominal xray at this time

## 2015-05-27 NOTE — ED Notes (Signed)
CBG 249

## 2015-05-27 NOTE — ED Notes (Signed)
Pt to ED via [facility] - unknown who called EMS for generalized weakness x6-8 weeks; per EMS, pt LOC has become altered in route and on arrival to ED pt is oriented to person only. Generally weak, able to follow commands. Pt is bradycardic @ 37. Pt is pale in appearance. Zoll at bedside. MD Zavitz at bedside. No neuro deficits noted

## 2015-05-28 ENCOUNTER — Inpatient Hospital Stay (HOSPITAL_COMMUNITY): Payer: Commercial Managed Care - HMO

## 2015-05-28 DIAGNOSIS — R079 Chest pain, unspecified: Secondary | ICD-10-CM | POA: Insufficient documentation

## 2015-05-28 LAB — MRSA PCR SCREENING: MRSA BY PCR: NEGATIVE

## 2015-05-28 LAB — C DIFFICILE QUICK SCREEN W PCR REFLEX
C Diff antigen: POSITIVE — AB
C Diff toxin: NEGATIVE

## 2015-05-28 LAB — APTT: aPTT: 49 seconds — ABNORMAL HIGH (ref 24–37)

## 2015-05-28 LAB — BLOOD GAS, ARTERIAL
ACID-BASE DEFICIT: 1.2 mmol/L (ref 0.0–2.0)
Bicarbonate: 22.5 mEq/L (ref 20.0–24.0)
DRAWN BY: 44135
FIO2: 0.21
O2 SAT: 96.4 %
PCO2 ART: 34.5 mmHg — AB (ref 35.0–45.0)
Patient temperature: 98.6
TCO2: 23.6 mmol/L (ref 0–100)
pH, Arterial: 7.431 (ref 7.350–7.450)
pO2, Arterial: 83.4 mmHg (ref 80.0–100.0)

## 2015-05-28 LAB — TROPONIN I
TROPONIN I: 0.53 ng/mL — AB (ref <0.031)
TROPONIN I: 0.67 ng/mL — AB (ref <0.031)
Troponin I: 0.2 ng/mL — ABNORMAL HIGH (ref <0.031)

## 2015-05-28 LAB — GLUCOSE, CAPILLARY
GLUCOSE-CAPILLARY: 138 mg/dL — AB (ref 65–99)
GLUCOSE-CAPILLARY: 153 mg/dL — AB (ref 65–99)
GLUCOSE-CAPILLARY: 152 mg/dL — AB (ref 65–99)
GLUCOSE-CAPILLARY: 156 mg/dL — AB (ref 65–99)

## 2015-05-28 LAB — CORTISOL: CORTISOL PLASMA: 21.7 ug/dL

## 2015-05-28 LAB — PROTIME-INR
INR: 2.17 — AB (ref 0.00–1.49)
PROTHROMBIN TIME: 24 s — AB (ref 11.6–15.2)

## 2015-05-28 LAB — MAGNESIUM: MAGNESIUM: 1.7 mg/dL (ref 1.7–2.4)

## 2015-05-28 LAB — HEPARIN LEVEL (UNFRACTIONATED): Heparin Unfractionated: 1.9 IU/mL — ABNORMAL HIGH (ref 0.30–0.70)

## 2015-05-28 MED ORDER — LACTULOSE 10 GM/15ML PO SOLN
20.0000 g | Freq: Two times a day (BID) | ORAL | Status: DC | PRN
  Administered 2015-05-28: 20 g via ORAL
  Filled 2015-05-28: qty 30

## 2015-05-28 MED ORDER — SORBITOL 70 % SOLN
960.0000 mL | TOPICAL_OIL | Freq: Once | ORAL | Status: DC
  Filled 2015-05-28: qty 240

## 2015-05-28 MED ORDER — SODIUM CHLORIDE 0.9 % IV BOLUS (SEPSIS)
1000.0000 mL | Freq: Once | INTRAVENOUS | Status: AC
  Administered 2015-05-28: 1000 mL via INTRAVENOUS

## 2015-05-28 MED ORDER — ONDANSETRON HCL 4 MG/2ML IJ SOLN
4.0000 mg | Freq: Four times a day (QID) | INTRAMUSCULAR | Status: DC | PRN
  Filled 2015-05-28: qty 2

## 2015-05-28 MED ORDER — ASPIRIN 325 MG PO TABS
325.0000 mg | ORAL_TABLET | Freq: Every day | ORAL | Status: DC
  Administered 2015-05-28: 325 mg via ORAL
  Filled 2015-05-28: qty 1

## 2015-05-28 MED ORDER — APIXABAN 5 MG PO TABS
5.0000 mg | ORAL_TABLET | Freq: Two times a day (BID) | ORAL | Status: DC
  Administered 2015-05-28 – 2015-05-31 (×7): 5 mg via ORAL
  Filled 2015-05-28 (×7): qty 1

## 2015-05-28 MED ORDER — HEPARIN (PORCINE) IN NACL 100-0.45 UNIT/ML-% IJ SOLN: 900.0000 [IU]/h | INTRAMUSCULAR | Status: DC

## 2015-05-28 MED ORDER — APIXABAN 5 MG PO TABS: 5.0000 mg | ORAL_TABLET | Freq: Two times a day (BID) | ORAL | Status: DC

## 2015-05-28 MED ORDER — MORPHINE SULFATE ER 15 MG PO TBCR
60.0000 mg | EXTENDED_RELEASE_TABLET | Freq: Two times a day (BID) | ORAL | Status: DC
  Administered 2015-05-28 – 2015-05-31 (×7): 60 mg via ORAL
  Filled 2015-05-28 (×7): qty 4

## 2015-05-28 MED ORDER — SODIUM CHLORIDE 0.9 % IV SOLN
INTRAVENOUS | Status: DC
  Administered 2015-05-28: 1000 mL via INTRAVENOUS
  Administered 2015-05-29: 03:00:00 via INTRAVENOUS

## 2015-05-28 MED ORDER — SODIUM CHLORIDE 0.9 % IV BOLUS (SEPSIS)
500.0000 mL | Freq: Once | INTRAVENOUS | Status: AC
  Administered 2015-05-28: 500 mL via INTRAVENOUS

## 2015-05-28 NOTE — Progress Notes (Signed)
ANTICOAGULATION CONSULT NOTE - Follow Up Consult  Pharmacy Consult for Heparin and Apixiban Indication: chest pain/ACS and atrial fibrillation   Allergies  Allergen Reactions  . Erythromycin Base Other (See Comments)    Reaction:  Fever   . Rosiglitazone Maleate Swelling  . Codeine Sulfate Rash  . Tetanus-Diphtheria Toxoids Td Rash and Other (See Comments)    Reaction:  Fever     Patient Measurements: Height: 5' 3"  (160 cm) Weight: 131 lb 4.8 oz (59.557 kg) IBW/kg (Calculated) : 52.4   Vital Signs: Temp: 97.7 F (36.5 C) (09/17 1128) Temp Source: Oral (09/17 1128) BP: 103/88 mmHg (09/17 0300)  Labs:  Recent Labs  05/27/15 1657 05/28/15 0032 05/28/15 0450 05/28/15 1100  HGB 13.4  --   --   --   HCT 38.3  --   --   --   PLT 149*  --   --   --   APTT  --   --   --  49*  LABPROT  --   --   --  24.0*  INR  --   --   --  2.17*  HEPARINUNFRC  --   --   --  1.90*  CREATININE 1.05*  --   --   --   TROPONINI  --  0.67* 0.53*  --     Estimated Creatinine Clearance: 46.5 mL/min (by C-G formula based on Cr of 1.05).   Medications:  Scheduled:  . apixaban  5 mg Oral BID  . atorvastatin  40 mg Oral q1800  . citalopram  20 mg Oral Daily  . dicyclomine  10 mg Oral QID  . insulin aspart  0-9 Units Subcutaneous TID WC  . linagliptin  5 mg Oral Daily  . midodrine  5 mg Oral TID WC  . morphine  60 mg Oral BID  . pantoprazole  40 mg Oral BID  . sodium chloride  1,000 mL Intravenous Once  . sorbitol, milk of mag, mineral oil, glycerin (SMOG) enema  960 mL Rectal Once  . sucralfate  1 g Oral QID   Infusions:   PRN: lactulose  Assessment: 59 y.o. female admitted with possible NSTEMI, PTA Eliquis for Afib on hold, for heparin. Last dose of Eliquis 9/15.  Eliquis will now be restarted per Cards and Internal Medicine.  Plts 149, Hgb 13.4, no s/sx of bleeding noted.    Plan:  D/c heparin. Restart apixiban 5 mg BID Monitor for s/sx of bleeding Pharmacy will sign off.   Please reconsult if needed.    Bennye Alm, PharmD Pharmacy Resident [phone]

## 2015-05-28 NOTE — Progress Notes (Addendum)
PROGRESS NOTE  [name] LGX:[id number] DOB: 1956/09/06 DOA: 05/27/2015 PCP: Alvester Chou, NP  Assessment/Plan:  Acute encephalopathy - appear resolved -suspect related to hypotension +/- multiple medications  Chest pain with elevated troponin concerning for ACS. -  -Cardiology has been consulted. -resume eliquis  Prolonged QtC -monitor -avoid prolonging agents  Abdominal pain -on xray, it appears patient has large stool burden  Sinus bradycardia with history of recurrent syncope - beta blockers Cardizem and amiodarone hold.  TSH 4.609  Hypotension -IVF bolus -check cortisol  Diabetes mellitus type 2 - will continue Januvia. Sliding scale coverage. Hold metformin while inpatient.  Chronic pain syndrome - reduce morphine  Hypertension - holding off Cardizem and beta blockers due to bradycardia and hypotension  Paroxysmal atrial fibrillation -  - in sinus -off rate controlling meds -continue eliquis   Code Status: full Family Communication: patient Disposition Plan:    Consultants:  cards  Procedures:      HPI/Subjective: C/o abd pain  Objective: Filed Vitals:   05/28/15 1128  BP:   Pulse:   Temp: 97.7 F (36.5 C)  Resp:    No intake or output data in the 24 hours ending 05/28/15 1219 Filed Weights   05/27/15 2300  Weight: 59.557 kg (131 lb 4.8 oz)    Exam:   General:  chronically ill appearing  Cardiovascular: rrr  Respiratory: clear  Abdomen: +BS, tender to palpation  Musculoskeletal: no edema   Data Reviewed: Basic Metabolic Panel:  Recent Labs Lab 05/27/15 1657 05/27/15 2036  NA 132*  --   K 4.6  --   CL 97*  --   CO2 24  --   GLUCOSE 266*  --   BUN 14  --   CREATININE 1.05*  --   CALCIUM 9.2  --   MG  --  1.7   Liver Function Tests:  Recent Labs Lab 05/27/15 2004  AST 30  ALT 29  ALKPHOS 71  BILITOT 1.0  PROT 6.1*  ALBUMIN 3.2*   No results for input(s): LIPASE, AMYLASE in the last 168  hours.  Recent Labs Lab 05/27/15 2005  AMMONIA 17   CBC:  Recent Labs Lab 05/27/15 1657  WBC 8.5  NEUTROABS 6.9  HGB 13.4  HCT 38.3  MCV 92.5  PLT 149*   Cardiac Enzymes:  Recent Labs Lab 05/28/15 0032 05/28/15 0450  TROPONINI 0.67* 0.53*   BNP (last 3 results)  Recent Labs  01/10/15 2300  BNP 62.5    ProBNP (last 3 results) No results for input(s): PROBNP in the last 8760 hours.  CBG:  Recent Labs Lab 05/22/15 1132 05/27/15 1835 05/27/15 2320 05/28/15 0730 05/28/15 1127  GLUCAP 219* 249* 206* 138* 153*    Recent Results (from the past 240 hour(s))  MRSA PCR Screening     Status: None   Collection Time: 05/19/15  1:00 AM  Result Value Ref Range Status   MRSA by PCR NEGATIVE NEGATIVE Final    Comment:        The GeneXpert MRSA Assay (FDA approved for NASAL specimens only), is one component of a comprehensive MRSA colonization surveillance program. It is not intended to diagnose MRSA infection nor to guide or monitor treatment for MRSA infections.   Culture, Urine     Status: None   Collection Time: 05/19/15  8:23 PM  Result Value Ref Range Status   Specimen Description URINE, RANDOM  Final   Special Requests NONE  Final   Culture MULTIPLE SPECIES  PRESENT, SUGGEST RECOLLECTION  Final   Report Status 05/21/2015 FINAL  Final  Culture, blood (routine x 2)     Status: None   Collection Time: 05/19/15  8:30 PM  Result Value Ref Range Status   Specimen Description BLOOD LEFT WRIST  Final   Special Requests BOTTLES DRAWN AEROBIC ONLY 5CC  Final   Culture NO GROWTH 5 DAYS  Final   Report Status 05/24/2015 FINAL  Final  Culture, blood (routine x 2)     Status: None   Collection Time: 05/19/15  8:37 PM  Result Value Ref Range Status   Specimen Description BLOOD RIGHT HAND  Final   Special Requests BOTTLES DRAWN AEROBIC ONLY 5CC  Final   Culture NO GROWTH 5 DAYS  Final   Report Status 05/24/2015 FINAL  Final  MRSA PCR Screening     Status:  None   Collection Time: 05/27/15 10:50 PM  Result Value Ref Range Status   MRSA by PCR NEGATIVE NEGATIVE Final    Comment:        The GeneXpert MRSA Assay (FDA approved for NASAL specimens only), is one component of a comprehensive MRSA colonization surveillance program. It is not intended to diagnose MRSA infection nor to guide or monitor treatment for MRSA infections.      Studies: Ct Head Wo Contrast  05/27/2015   CLINICAL DATA:  59 year old female with fall  EXAM: CT HEAD WITHOUT CONTRAST  CT CERVICAL SPINE WITHOUT CONTRAST  TECHNIQUE: Multidetector CT imaging of the head and cervical spine was performed following the standard protocol without intravenous contrast. Multiplanar CT image reconstructions of the cervical spine were also generated.  COMPARISON:  None.  FINDINGS: CT HEAD FINDINGS  The ventricles and sulci are appropriate in size for patient's age. Mild periventricular and deep white matter hypodensities represent chronic microvascular ischemic changes. There is no intracranial hemorrhage. No mass effect or midline shift identified.  The visualized paranasal sinuses and mastoid air cells are well aerated. The calvarium is intact.  CT CERVICAL SPINE FINDINGS  There is no acute fracture or subluxation of the cervical spine.Mild multilevel degenerative changes.The odontoid and spinous processes are intact.There is normal anatomic alignment of the C1-C2 lateral masses. The visualized soft tissues appear unremarkable.  IMPRESSION: No acute intracranial hemorrhage.  Mild chronic microvascular ischemic changes.  No acute/traumatic cervical spine pathology.   Electronically Signed   By: Anner Crete M.D.   On: 05/27/2015 18:23   Ct Cervical Spine Wo Contrast  05/27/2015   CLINICAL DATA:  59 year old female with fall  EXAM: CT HEAD WITHOUT CONTRAST  CT CERVICAL SPINE WITHOUT CONTRAST  TECHNIQUE: Multidetector CT imaging of the head and cervical spine was performed following the  standard protocol without intravenous contrast. Multiplanar CT image reconstructions of the cervical spine were also generated.  COMPARISON:  None.  FINDINGS: CT HEAD FINDINGS  The ventricles and sulci are appropriate in size for patient's age. Mild periventricular and deep white matter hypodensities represent chronic microvascular ischemic changes. There is no intracranial hemorrhage. No mass effect or midline shift identified.  The visualized paranasal sinuses and mastoid air cells are well aerated. The calvarium is intact.  CT CERVICAL SPINE FINDINGS  There is no acute fracture or subluxation of the cervical spine.Mild multilevel degenerative changes.The odontoid and spinous processes are intact.There is normal anatomic alignment of the C1-C2 lateral masses. The visualized soft tissues appear unremarkable.  IMPRESSION: No acute intracranial hemorrhage.  Mild chronic microvascular ischemic changes.  No acute/traumatic cervical spine  pathology.   Electronically Signed   By: Anner Crete M.D.   On: 05/27/2015 18:23   Dg Chest Port 1 View  05/28/2015   CLINICAL DATA:  Abdominal and chest pain today.  EXAM: PORTABLE ABDOMEN - 1 VIEW; PORTABLE CHEST - 1 VIEW  COMPARISON:  Abdominal radiograph 05/19/2015. Chest x-ray 05/27/2015  FINDINGS: One view chest:  The heart is enlarged but stable. There is a left ventricular configuration. Minimal streaky atelectasis but no infiltrates or effusions. The bony thorax is intact.  One-view abdomen: Moderate persistent contrast in the right and proximal transverse colon. The rest of the colon is decompressed. Could not exclude a stricture. No obvious mass on the recent CT scan.  IMPRESSION: 1. Stable cardiac enlargement.  No acute pulmonary findings. 2. Persistent stool and contrast distended right and proximal transverse colon with otherwise decompressed distal colon. Could not exclude a stricture. No obvious mass on the recent CT scan.   Electronically Signed   By: Marijo Sanes M.D.   On: 05/28/2015 09:30   Dg Chest Portable 1 View  05/27/2015   CLINICAL DATA:  Fall today  EXAM: PORTABLE CHEST - 1 VIEW  COMPARISON:  05/18/2015  FINDINGS: Cardiomediastinal silhouette is stable. No infiltrate or pulmonary edema. Mild basilar atelectasis. There is no pneumothorax. No gross fractures are noted.  IMPRESSION: No active disease.   Electronically Signed   By: Lahoma Crocker M.D.   On: 05/27/2015 20:14   Dg Abd Portable 1v  05/28/2015   CLINICAL DATA:  Abdominal and chest pain today.  EXAM: PORTABLE ABDOMEN - 1 VIEW; PORTABLE CHEST - 1 VIEW  COMPARISON:  Abdominal radiograph 05/19/2015. Chest x-ray 05/27/2015  FINDINGS: One view chest:  The heart is enlarged but stable. There is a left ventricular configuration. Minimal streaky atelectasis but no infiltrates or effusions. The bony thorax is intact.  One-view abdomen: Moderate persistent contrast in the right and proximal transverse colon. The rest of the colon is decompressed. Could not exclude a stricture. No obvious mass on the recent CT scan.  IMPRESSION: 1. Stable cardiac enlargement.  No acute pulmonary findings. 2. Persistent stool and contrast distended right and proximal transverse colon with otherwise decompressed distal colon. Could not exclude a stricture. No obvious mass on the recent CT scan.   Electronically Signed   By: Marijo Sanes M.D.   On: 05/28/2015 09:30    Scheduled Meds: . apixaban  5 mg Oral BID  . atorvastatin  40 mg Oral q1800  . citalopram  20 mg Oral Daily  . dicyclomine  10 mg Oral QID  . insulin aspart  0-9 Units Subcutaneous TID WC  . linagliptin  5 mg Oral Daily  . midodrine  5 mg Oral TID WC  . morphine  60 mg Oral BID  . pantoprazole  40 mg Oral BID  . sodium chloride  1,000 mL Intravenous Once  . sorbitol, milk of mag, mineral oil, glycerin (SMOG) enema  960 mL Rectal Once  . sucralfate  1 g Oral QID   Continuous Infusions:  Antibiotics Given (last 72 hours)    None       Principal Problem:   Acute encephalopathy Active Problems:   Chronic pain syndrome   Paroxysmal atrial fibrillation   Diabetes type 2, uncontrolled   Symptomatic bradycardia   Elevated troponin    Time spent: 35 min    VANN, [facility] Pager [phone]. If 7PM-7AM, please contact night-coverage at [email or web address], password [facility] 05/28/2015, 12:19  PM  LOS: 1 day

## 2015-05-28 NOTE — Progress Notes (Signed)
Subjective:  Hypotension noted.  BP meds currently held. Amiodarone being held. Heart rate was 41 sinus bradycardia in emergency room.  She presented with fatigue, unsteadiness, witnessed falls. She was just discharged in earlier September for syncope. Midsternal chest pain. Troponin was mildly elevated at 0.19 0.67, 0.53 -flat trend.  Echo showed normal ejection fraction earlier this month.  Objective:  Vital Signs in the last 24 hours: Temp:  [97.3 F (36.3 C)-98.4 F (36.9 C)] 97.7 F (36.5 C) (09/17 1128) Pulse Rate:  [37-53] 50 (09/16 2254) Resp:  [12-25] 12 (09/17 0513) BP: (103-183)/(59-103) 103/88 mmHg (09/17 0300) SpO2:  [97 %-100 %] 97 % (09/17 0300) Weight:  [131 lb 4.8 oz (59.557 kg)] 131 lb 4.8 oz (59.557 kg) (09/16 2300)  Intake/Output from previous day:     Physical Exam: General: Well developed, well nourished, in no acute distress. Head:  Normocephalic and atraumatic. Lungs: Clear to auscultation and percussion. Heart: Normal S1 and S2.  No murmur, rubs or gallops.  Abdomen: soft, non-tender, positive bowel sounds. Extremities: No clubbing or cyanosis. No edema. Neurologic: Alert and oriented x 3.    Lab Results:  Recent Labs  05/27/15 1657  WBC 8.5  HGB 13.4  PLT 149*    Recent Labs  05/27/15 1657  NA 132*  K 4.6  CL 97*  CO2 24  GLUCOSE 266*  BUN 14  CREATININE 1.05*    Recent Labs  05/28/15 0032 05/28/15 0450  TROPONINI 0.67* 0.53*   Hepatic Function Panel  Recent Labs  05/27/15 2004  PROT 6.1*  ALBUMIN 3.2*  AST 30  ALT 29  ALKPHOS 71  BILITOT 1.0  BILIDIR 0.3  IBILI 0.7   No results for input(s): CHOL in the last 72 hours. No results for input(s): PROTIME in the last 72 hours.  Imaging: Ct Head Wo Contrast  05/27/2015   CLINICAL DATA:  59 year old female with fall  EXAM: CT HEAD WITHOUT CONTRAST  CT CERVICAL SPINE WITHOUT CONTRAST  TECHNIQUE: Multidetector CT imaging of the head and cervical spine was  performed following the standard protocol without intravenous contrast. Multiplanar CT image reconstructions of the cervical spine were also generated.  COMPARISON:  None.  FINDINGS: CT HEAD FINDINGS  The ventricles and sulci are appropriate in size for patient's age. Mild periventricular and deep white matter hypodensities represent chronic microvascular ischemic changes. There is no intracranial hemorrhage. No mass effect or midline shift identified.  The visualized paranasal sinuses and mastoid air cells are well aerated. The calvarium is intact.  CT CERVICAL SPINE FINDINGS  There is no acute fracture or subluxation of the cervical spine.Mild multilevel degenerative changes.The odontoid and spinous processes are intact.There is normal anatomic alignment of the C1-C2 lateral masses. The visualized soft tissues appear unremarkable.  IMPRESSION: No acute intracranial hemorrhage.  Mild chronic microvascular ischemic changes.  No acute/traumatic cervical spine pathology.   Electronically Signed   By: Anner Crete M.D.   On: 05/27/2015 18:23   Ct Cervical Spine Wo Contrast  05/27/2015   CLINICAL DATA:  59 year old female with fall  EXAM: CT HEAD WITHOUT CONTRAST  CT CERVICAL SPINE WITHOUT CONTRAST  TECHNIQUE: Multidetector CT imaging of the head and cervical spine was performed following the standard protocol without intravenous contrast. Multiplanar CT image reconstructions of the cervical spine were also generated.  COMPARISON:  None.  FINDINGS: CT HEAD FINDINGS  The ventricles and sulci are appropriate in size for patient's age. Mild periventricular and deep white matter hypodensities represent  chronic microvascular ischemic changes. There is no intracranial hemorrhage. No mass effect or midline shift identified.  The visualized paranasal sinuses and mastoid air cells are well aerated. The calvarium is intact.  CT CERVICAL SPINE FINDINGS  There is no acute fracture or subluxation of the cervical spine.Mild  multilevel degenerative changes.The odontoid and spinous processes are intact.There is normal anatomic alignment of the C1-C2 lateral masses. The visualized soft tissues appear unremarkable.  IMPRESSION: No acute intracranial hemorrhage.  Mild chronic microvascular ischemic changes.  No acute/traumatic cervical spine pathology.   Electronically Signed   By: Anner Crete M.D.   On: 05/27/2015 18:23   Dg Chest Port 1 View  05/28/2015   CLINICAL DATA:  Abdominal and chest pain today.  EXAM: PORTABLE ABDOMEN - 1 VIEW; PORTABLE CHEST - 1 VIEW  COMPARISON:  Abdominal radiograph 05/19/2015. Chest x-ray 05/27/2015  FINDINGS: One view chest:  The heart is enlarged but stable. There is a left ventricular configuration. Minimal streaky atelectasis but no infiltrates or effusions. The bony thorax is intact.  One-view abdomen: Moderate persistent contrast in the right and proximal transverse colon. The rest of the colon is decompressed. Could not exclude a stricture. No obvious mass on the recent CT scan.  IMPRESSION: 1. Stable cardiac enlargement.  No acute pulmonary findings. 2. Persistent stool and contrast distended right and proximal transverse colon with otherwise decompressed distal colon. Could not exclude a stricture. No obvious mass on the recent CT scan.   Electronically Signed   By: Marijo Sanes M.D.   On: 05/28/2015 09:30   Dg Chest Portable 1 View  05/27/2015   CLINICAL DATA:  Fall today  EXAM: PORTABLE CHEST - 1 VIEW  COMPARISON:  05/18/2015  FINDINGS: Cardiomediastinal silhouette is stable. No infiltrate or pulmonary edema. Mild basilar atelectasis. There is no pneumothorax. No gross fractures are noted.  IMPRESSION: No active disease.   Electronically Signed   By: Lahoma Crocker M.D.   On: 05/27/2015 20:14   Dg Abd Portable 1v  05/28/2015   CLINICAL DATA:  Abdominal and chest pain today.  EXAM: PORTABLE ABDOMEN - 1 VIEW; PORTABLE CHEST - 1 VIEW  COMPARISON:  Abdominal radiograph 05/19/2015. Chest x-ray  05/27/2015  FINDINGS: One view chest:  The heart is enlarged but stable. There is a left ventricular configuration. Minimal streaky atelectasis but no infiltrates or effusions. The bony thorax is intact.  One-view abdomen: Moderate persistent contrast in the right and proximal transverse colon. The rest of the colon is decompressed. Could not exclude a stricture. No obvious mass on the recent CT scan.  IMPRESSION: 1. Stable cardiac enlargement.  No acute pulmonary findings. 2. Persistent stool and contrast distended right and proximal transverse colon with otherwise decompressed distal colon. Could not exclude a stricture. No obvious mass on the recent CT scan.   Electronically Signed   By: Marijo Sanes M.D.   On: 05/28/2015 09:30   Personally viewed.   Telemetry: Sinus bradycardia originally, now currently sinus rhythm. Heart rate has improved.  EKG:   05/27/15 at 2043 Sinus rhythm 52, nonspecific ST T wave changes Personally viewed.   Cardiac Studies:  ECHO normal EF  Scheduled Meds: . aspirin  325 mg Oral Daily  . atorvastatin  40 mg Oral q1800  . citalopram  20 mg Oral Daily  . dicyclomine  10 mg Oral QID  . insulin aspart  0-9 Units Subcutaneous TID WC  . linagliptin  5 mg Oral Daily  . metoCLOPramide  10 mg  Oral TID AC & HS  . midodrine  5 mg Oral TID WC  . morphine  60 mg Oral BID  . pantoprazole  40 mg Oral BID  . sodium chloride  1,000 mL Intravenous Once  . sorbitol, milk of mag, mineral oil, glycerin (SMOG) enema  960 mL Rectal Once  . sucralfate  1 g Oral QID   Continuous Infusions: . heparin     PRN Meds:.ondansetron (ZOFRAN) IV   Assessment/Plan:  Principal Problem:   Acute encephalopathy Active Problems:   Chronic pain syndrome   Paroxysmal atrial fibrillation   Diabetes type 2, uncontrolled   Symptomatic bradycardia   Elevated troponin  59 year old female with recurrent syncopal episodes, likely from hypotension. Chronic pain syndrome. Recent paroxysmal atrial  fibrillation. Elevated troponin.  1. Elevated troponin-this is likely demand ischemia. Flat trend. Because of this, we will go ahead and discontinue IV heparin and place her back on Eliquis for her paroxysmal atrial fibrillation. This is not appear to be acute coronary syndrome. She does have an appointment with cardiology in [place] on the 21st. I think it would be reasonable as an outpatient to check a nuclear stress test, noninvasive evaluation to ensure that she does not have any high risk signs of ischemia.  2. Paroxysmal atrial fibrillation  - Agree with discontinuation of amiodarone. Clearly she had sinus bradycardia on admission. Her QTC is also quite prolonged in the 550 range. Avoid Zofran, Haldol or other QT prolonging agents. She is not showing any signs of ventricular tachycardia.  - Currently sinus rhythm. Sinus bradycardia has resolved.  3. Sinus bradycardia  - At the time of admission, heart rate in the 40s. With her hypotension and bradycardia, likely playing a role in her fall/syncopal event. However, her medications which included amiodarone, diltiazem, metoprolol 50 mg all could have potentiated her bradycardia. She was on 400 mg twice a day of amiodarone. This is been discontinued. No indication for pacemaker at this time.    3. Hypotension-on Midodrine as outpatient. This seems to have been a chronic issue. Spoke with Dr. Eliseo Squires. She is going to investigate adrenal insufficiency. She does have subtle hyponatremia.    SKAINS, [place] 05/28/2015, 11:41 AM

## 2015-05-28 NOTE — Progress Notes (Signed)
Pt had 3 "syncopal" episodes. Pt b/p 103/88, hr52, episodes not lasting longer than 20 seconds. MD notified. Will continue to monitor pt.

## 2015-05-28 NOTE — Progress Notes (Signed)
PT Cancellation Note  Patient Details Name: CHARISE LEINBACH MRN: [id number] DOB: 18-Mar-1956   Cancelled Treatment:    Reason Eval/Treat Not Completed: Patient not medically ready (low BP. MD asked to defer today.)   MAYCOCK,CARY 05/28/2015, 4:43 PM  [facility] PT [phone]

## 2015-05-28 NOTE — Progress Notes (Signed)
Pt b/p 53/28 per automatic cuff and 60/30 manual. MD notified awaiting call back. Pt asymptomatic at present. Sitting in bed conversing.  Will continue to monitor patient.

## 2015-05-28 NOTE — Progress Notes (Signed)
ANTICOAGULATION CONSULT NOTE - Initial Consult  Pharmacy Consult for Heparin Indication: chest pain/ACS  Allergies  Allergen Reactions  . Erythromycin Base Other (See Comments)    Reaction:  Fever   . Rosiglitazone Maleate Swelling  . Codeine Sulfate Rash  . Tetanus-Diphtheria Toxoids Td Rash and Other (See Comments)    Reaction:  Fever     Patient Measurements: Height: 5' 3"  (160 cm) Weight: 131 lb 4.8 oz (59.557 kg) IBW/kg (Calculated) : 52.4  Vital Signs: Temp: 98.4 F (36.9 C) (09/16 2254) Temp Source: Oral (09/16 2254) BP: 103/88 mmHg (09/17 0300) Pulse Rate: 50 (09/16 2254)  Labs:  Recent Labs  05/27/15 1657 05/28/15 0032 05/28/15 0450  HGB 13.4  --   --   HCT 38.3  --   --   PLT 149*  --   --   CREATININE 1.05*  --   --   TROPONINI  --  0.67* 0.53*    Estimated Creatinine Clearance: 46.5 mL/min (by C-G formula based on Cr of 1.05).   Medical History: Past Medical History  Diagnosis Date  . Type 1 diabetes   . Hypothyroid   . Degenerative disk disease   . Stomach ulcer   . Diverticulitis   . Syncope 01/2015  . Anxiety   . GERD (gastroesophageal reflux disease)   . History of hiatal hernia   . Cancer     HX OF CANCER OF UTERUS   . TIA (transient ischemic attack) 02/2015  . Hypertension   . Pancreatitis   . PAF (paroxysmal atrial fibrillation)     a. new onset 03/2015 in setting of intractable N/V; b. on Eliquis 5 mg bid; c. CHADSVASc at least 4 (DM, TIA x 2, female)    Medications:  Prescriptions prior to admission  Medication Sig Dispense Refill Last Dose  . amiodarone (PACERONE) 400 MG tablet Take 1 tablet (400 mg total) by mouth 2 (two) times daily. 30 tablet 0 05/27/2015 at Unknown time  . apixaban (ELIQUIS) 5 MG TABS tablet Take 1 tablet (5 mg total) by mouth 2 (two) times daily. 60 tablet 0 05/26/2015 at Unknown time  . atorvastatin (LIPITOR) 40 MG tablet Take 1 tablet (40 mg total) by mouth daily at 6 PM. 30 tablet 0 05/26/2015 at Unknown  time  . citalopram (CELEXA) 20 MG tablet Take 1 tablet by mouth daily.   05/27/2015 at Unknown time  . cloNIDine (CATAPRES) 0.1 MG tablet Take 1 tablet (0.1 mg total) by mouth 3 (three) times daily as needed. (Patient taking differently: Take 0.1 mg by mouth daily. ) 12 tablet 0 05/27/2015 at Unknown time  . collagenase (SANTYL) ointment Apply topically daily. 15 g 0 unknown  . dicyclomine (BENTYL) 10 MG capsule Take 1 capsule by mouth 4 (four) times daily.   05/27/2015 at Unknown time  . diltiazem (CARDIZEM CD) 120 MG 24 hr capsule Take 1 capsule (120 mg total) by mouth daily. (Patient taking differently: Take 120 mg by mouth at bedtime. ) 30 capsule 0 05/26/2015 at Unknown time  . diphenhydrAMINE (BENADRYL) 25 mg capsule Take 2 capsules (50 mg total) by mouth every 6 (six) hours as needed. (Patient taking differently: Take 50 mg by mouth every 6 (six) hours as needed for itching or allergies. ) 60 capsule 0 05/27/2015 at Unknown time  . HYDROcodone-acetaminophen (NORCO/VICODIN) 5-325 MG per tablet Take 1 tablet by mouth every 6 (six) hours as needed for moderate pain.   05/27/2015 at Unknown time  . JANUVIA 100  MG tablet Take 100 mg by mouth daily.   05/27/2015 at Unknown time  . lisinopril (PRINIVIL,ZESTRIL) 5 MG tablet Take 1 tablet by mouth daily.   05/27/2015 at Unknown time  . metFORMIN (GLUCOPHAGE) 1000 MG tablet Take 1 tablet by mouth 2 (two) times daily.   05/27/2015 at Unknown time  . metoCLOPramide (REGLAN) 10 MG tablet Take 1 tablet (10 mg total) by mouth 4 (four) times daily -  before meals and at bedtime. 60 tablet 0 05/27/2015 at Unknown time  . metoprolol succinate (TOPROL-XL) 50 MG 24 hr tablet Take 1 tablet (50 mg total) by mouth daily. 60 tablet 1 05/27/2015 at 800  . midodrine (PROAMATINE) 5 MG tablet Take 1 tablet (5 mg total) by mouth 3 (three) times daily with meals. 90 tablet 0 05/26/2015 at Unknown time  . morphine (MS CONTIN) 100 MG 12 hr tablet Take 100 mg by mouth 2 (two) times daily.     05/27/2015 at Unknown time  . pantoprazole (PROTONIX) 40 MG tablet Take 1 tablet (40 mg total) by mouth 2 (two) times daily. 60 tablet 0 05/26/2015 at Unknown time  . sucralfate (CARAFATE) 1 G tablet Take 1 tablet (1 g total) by mouth 4 (four) times daily. 30 tablet 0 05/26/2015 at Unknown time  . tiZANidine (ZANAFLEX) 4 MG tablet Take 1 tablet by mouth every 6 (six) hours as needed for muscle spasms.    05/27/2015 at Unknown time  . zolpidem (AMBIEN) 5 MG tablet Take 1 tablet by mouth at bedtime.   05/26/2015 at Unknown time  . loperamide (IMODIUM A-D) 2 MG tablet Take 2 tablets (4 mg total) by mouth 4 (four) times daily as needed for diarrhea or loose stools. (Patient not taking: Reported on 04/13/2015) 30 tablet 0 Not Taking at Unknown time  . ondansetron (ZOFRAN ODT) 8 MG disintegrating tablet Take 1 tablet (8 mg total) by mouth every 8 (eight) hours as needed for nausea or vomiting. (Patient not taking: Reported on 05/27/2015) 20 tablet 0 Not Taking at Unknown time    Assessment: 59 y.o. female with NSTEMI, h/o Afib Eliquis on hold, for heparin.  Last dose of Eliquis 9/15  Goal of Therapy:  APTT 66-102 while apixaban affecting Heparin level Heparin level 0.3-0.7 units/ml Monitor platelets by anticoagulation protocol: Yes   Plan:  Baseline PTT, PT/INR, Heparin level now Start heparin 900 units/hr PTT in 6 hrs  Caryl Pina 05/28/2015,7:23 AM

## 2015-05-29 DIAGNOSIS — G894 Chronic pain syndrome: Secondary | ICD-10-CM

## 2015-05-29 DIAGNOSIS — I951 Orthostatic hypotension: Secondary | ICD-10-CM

## 2015-05-29 LAB — GLUCOSE, CAPILLARY
GLUCOSE-CAPILLARY: 157 mg/dL — AB (ref 65–99)
GLUCOSE-CAPILLARY: 208 mg/dL — AB (ref 65–99)
Glucose-Capillary: 172 mg/dL — ABNORMAL HIGH (ref 65–99)
Glucose-Capillary: 201 mg/dL — ABNORMAL HIGH (ref 65–99)

## 2015-05-29 LAB — URINE CULTURE: Culture: >=100000

## 2015-05-29 LAB — BASIC METABOLIC PANEL
Anion gap: 8 (ref 5–15)
BUN: 19 mg/dL (ref 6–20)
CALCIUM: 8.4 mg/dL — AB (ref 8.9–10.3)
CO2: 22 mmol/L (ref 22–32)
CREATININE: 1.61 mg/dL — AB (ref 0.44–1.00)
Chloride: 110 mmol/L (ref 101–111)
GFR calc non Af Amer: 33 mL/min — ABNORMAL LOW (ref >60)
GFR, EST AFRICAN AMERICAN: 39 mL/min — AB (ref >60)
Glucose, Bld: 141 mg/dL — ABNORMAL HIGH (ref 65–99)
Potassium: 3.2 mmol/L — ABNORMAL LOW (ref 3.5–5.1)
SODIUM: 140 mmol/L (ref 135–145)

## 2015-05-29 LAB — CBC
HCT: 36.3 % (ref 36.0–46.0)
Hemoglobin: 12.2 g/dL (ref 12.0–15.0)
MCH: 32.4 pg (ref 26.0–34.0)
MCHC: 33.6 g/dL (ref 30.0–36.0)
MCV: 96.5 fL (ref 78.0–100.0)
Platelets: 156 10*3/uL (ref 150–400)
RBC: 3.76 MIL/uL — ABNORMAL LOW (ref 3.87–5.11)
RDW: 13.1 % (ref 11.5–15.5)
WBC: 16.6 10*3/uL — ABNORMAL HIGH (ref 4.0–10.5)

## 2015-05-29 MED ORDER — POTASSIUM CHLORIDE CRYS ER 20 MEQ PO TBCR
40.0000 meq | EXTENDED_RELEASE_TABLET | Freq: Once | ORAL | Status: AC
  Administered 2015-05-29: 40 meq via ORAL

## 2015-05-29 MED ORDER — SODIUM CHLORIDE 0.9 % IV BOLUS (SEPSIS)
500.0000 mL | Freq: Once | INTRAVENOUS | Status: AC
  Administered 2015-05-29: 500 mL via INTRAVENOUS

## 2015-05-29 MED ORDER — POTASSIUM CHLORIDE CRYS ER 20 MEQ PO TBCR: 40.0000 meq | EXTENDED_RELEASE_TABLET | Freq: Once | ORAL | Status: AC

## 2015-05-29 NOTE — Progress Notes (Signed)
PROGRESS NOTE  ELEXIS POLLAK NWG:[id number] DOB: Sep 11, 1955 DOA: 05/27/2015 PCP: Alvester Chou, NP  Assessment/Plan: Acute encephalopathy - resolved -suspect related to hypotension +/- multiple medications  Orthostatic hypotension -dropped 60 points when standing, asymptomatic -add TED hose -cortisol ok -recheck in AM  Chest pain with elevated troponin concerning for ACS. -  -Cardiology has been consulted. -resume eliquis  Prolonged QtC -monitor -avoid prolonging agents- celexa d/c'd, zofran d/c'd  Abdominal pain -on xray, it appears patient has large stool burden -having liquid stools- c diff negative -refused enema  Sinus bradycardia with history of recurrent syncope - beta blockers Cardizem and amiodarone hold.  TSH 4.609  Hypotension- resolved -IVF bolus -cortisol ok  Diabetes mellitus type 2 - will continue Januvia. Sliding scale coverage. Hold metformin while inpatient.  Chronic pain syndrome - reduce morphine  Hypertension - holding off Cardizem and beta blockers due to bradycardia and hypotension  Paroxysmal atrial fibrillation -  - in sinus -off rate controlling meds -continue eliquis -tachy when up with PT  Elevated CR -trend  Leukocytosis -trend -no fevers  Hypokalemia -replete  Code Status: full Family Communication: patient Disposition Plan: PT Eval   Consultants:  cards  Procedures:      HPI/Subjective: Feeling much better, wants to go home  Objective: Filed Vitals:   05/29/15 0730  BP:   Pulse:   Temp: 98.8 F (37.1 C)  Resp:     Intake/Output Summary (Last 24 hours) at 05/29/15 0918 Last data filed at 05/29/15 0810  Gross per 24 hour  Intake   1880 ml  Output      0 ml  Net   1880 ml   Filed Weights   05/27/15 2300  Weight: 59.557 kg (131 lb 4.8 oz)    Exam:   General:  chronically ill appearing  Cardiovascular: rrr  Respiratory: clear  Abdomen: +BS, NT  Musculoskeletal: no edema   Data  Reviewed: Basic Metabolic Panel:  Recent Labs Lab 05/27/15 1657 05/27/15 2036 05/29/15 0305  NA 132*  --  140  K 4.6  --  3.2*  CL 97*  --  110  CO2 24  --  22  GLUCOSE 266*  --  141*  BUN 14  --  19  CREATININE 1.05*  --  1.61*  CALCIUM 9.2  --  8.4*  MG  --  1.7  --    Liver Function Tests:  Recent Labs Lab 05/27/15 2004  AST 30  ALT 29  ALKPHOS 71  BILITOT 1.0  PROT 6.1*  ALBUMIN 3.2*   No results for input(s): LIPASE, AMYLASE in the last 168 hours.  Recent Labs Lab 05/27/15 2005  AMMONIA 17   CBC:  Recent Labs Lab 05/27/15 1657 05/29/15 0305  WBC 8.5 16.6*  NEUTROABS 6.9  --   HGB 13.4 12.2  HCT 38.3 36.3  MCV 92.5 96.5  PLT 149* 156   Cardiac Enzymes:  Recent Labs Lab 05/28/15 0032 05/28/15 0450 05/28/15 1635  TROPONINI 0.67* 0.53* 0.20*   BNP (last 3 results)  Recent Labs  01/10/15 2300  BNP 62.5    ProBNP (last 3 results) No results for input(s): PROBNP in the last 8760 hours.  CBG:  Recent Labs Lab 05/28/15 0730 05/28/15 1127 05/28/15 1633 05/28/15 2156 05/29/15 0729  GLUCAP 138* 153* 152* 156* 157*    Recent Results (from the past 240 hour(s))  Culture, Urine     Status: None   Collection Time: 05/19/15  8:23 PM  Result Value  Ref Range Status   Specimen Description URINE, RANDOM  Final   Special Requests NONE  Final   Culture MULTIPLE SPECIES PRESENT, SUGGEST RECOLLECTION  Final   Report Status 05/21/2015 FINAL  Final  Culture, blood (routine x 2)     Status: None   Collection Time: 05/19/15  8:30 PM  Result Value Ref Range Status   Specimen Description BLOOD LEFT WRIST  Final   Special Requests BOTTLES DRAWN AEROBIC ONLY 5CC  Final   Culture NO GROWTH 5 DAYS  Final   Report Status 05/24/2015 FINAL  Final  Culture, blood (routine x 2)     Status: None   Collection Time: 05/19/15  8:37 PM  Result Value Ref Range Status   Specimen Description BLOOD RIGHT HAND  Final   Special Requests BOTTLES DRAWN AEROBIC  ONLY 5CC  Final   Culture NO GROWTH 5 DAYS  Final   Report Status 05/24/2015 FINAL  Final  Urine culture     Status: None (Preliminary result)   Collection Time: 05/27/15  7:36 PM  Result Value Ref Range Status   Specimen Description URINE, RANDOM  Final   Special Requests NONE  Final   Culture NO GROWTH < 24 HOURS  Final   Report Status PENDING  Incomplete  MRSA PCR Screening     Status: None   Collection Time: 05/27/15 10:50 PM  Result Value Ref Range Status   MRSA by PCR NEGATIVE NEGATIVE Final    Comment:        The GeneXpert MRSA Assay (FDA approved for NASAL specimens only), is one component of a comprehensive MRSA colonization surveillance program. It is not intended to diagnose MRSA infection nor to guide or monitor treatment for MRSA infections.   C difficile quick scan w PCR reflex     Status: Abnormal   Collection Time: 05/28/15  3:05 PM  Result Value Ref Range Status   C Diff antigen POSITIVE (A) NEGATIVE Final   C Diff toxin NEGATIVE NEGATIVE Final   C Diff interpretation   Final    C. difficile present, but toxin not detected. This indicates colonization. In most cases, this does not require treatment. If patient has signs and symptoms consistent with colitis, consider treatment.     Studies: Ct Head Wo Contrast  05/27/2015   CLINICAL DATA:  59 year old female with fall  EXAM: CT HEAD WITHOUT CONTRAST  CT CERVICAL SPINE WITHOUT CONTRAST  TECHNIQUE: Multidetector CT imaging of the head and cervical spine was performed following the standard protocol without intravenous contrast. Multiplanar CT image reconstructions of the cervical spine were also generated.  COMPARISON:  None.  FINDINGS: CT HEAD FINDINGS  The ventricles and sulci are appropriate in size for patient's age. Mild periventricular and deep white matter hypodensities represent chronic microvascular ischemic changes. There is no intracranial hemorrhage. No mass effect or midline shift identified.  The  visualized paranasal sinuses and mastoid air cells are well aerated. The calvarium is intact.  CT CERVICAL SPINE FINDINGS  There is no acute fracture or subluxation of the cervical spine.Mild multilevel degenerative changes.The odontoid and spinous processes are intact.There is normal anatomic alignment of the C1-C2 lateral masses. The visualized soft tissues appear unremarkable.  IMPRESSION: No acute intracranial hemorrhage.  Mild chronic microvascular ischemic changes.  No acute/traumatic cervical spine pathology.   Electronically Signed   By: Anner Crete M.D.   On: 05/27/2015 18:23   Ct Cervical Spine Wo Contrast  05/27/2015   CLINICAL DATA:  59 year old  female with fall  EXAM: CT HEAD WITHOUT CONTRAST  CT CERVICAL SPINE WITHOUT CONTRAST  TECHNIQUE: Multidetector CT imaging of the head and cervical spine was performed following the standard protocol without intravenous contrast. Multiplanar CT image reconstructions of the cervical spine were also generated.  COMPARISON:  None.  FINDINGS: CT HEAD FINDINGS  The ventricles and sulci are appropriate in size for patient's age. Mild periventricular and deep white matter hypodensities represent chronic microvascular ischemic changes. There is no intracranial hemorrhage. No mass effect or midline shift identified.  The visualized paranasal sinuses and mastoid air cells are well aerated. The calvarium is intact.  CT CERVICAL SPINE FINDINGS  There is no acute fracture or subluxation of the cervical spine.Mild multilevel degenerative changes.The odontoid and spinous processes are intact.There is normal anatomic alignment of the C1-C2 lateral masses. The visualized soft tissues appear unremarkable.  IMPRESSION: No acute intracranial hemorrhage.  Mild chronic microvascular ischemic changes.  No acute/traumatic cervical spine pathology.   Electronically Signed   By: Anner Crete M.D.   On: 05/27/2015 18:23   Dg Chest Port 1 View  05/28/2015   CLINICAL DATA:   Abdominal and chest pain today.  EXAM: PORTABLE ABDOMEN - 1 VIEW; PORTABLE CHEST - 1 VIEW  COMPARISON:  Abdominal radiograph 05/19/2015. Chest x-ray 05/27/2015  FINDINGS: One view chest:  The heart is enlarged but stable. There is a left ventricular configuration. Minimal streaky atelectasis but no infiltrates or effusions. The bony thorax is intact.  One-view abdomen: Moderate persistent contrast in the right and proximal transverse colon. The rest of the colon is decompressed. Could not exclude a stricture. No obvious mass on the recent CT scan.  IMPRESSION: 1. Stable cardiac enlargement.  No acute pulmonary findings. 2. Persistent stool and contrast distended right and proximal transverse colon with otherwise decompressed distal colon. Could not exclude a stricture. No obvious mass on the recent CT scan.   Electronically Signed   By: Marijo Sanes M.D.   On: 05/28/2015 09:30   Dg Chest Portable 1 View  05/27/2015   CLINICAL DATA:  Fall today  EXAM: PORTABLE CHEST - 1 VIEW  COMPARISON:  05/18/2015  FINDINGS: Cardiomediastinal silhouette is stable. No infiltrate or pulmonary edema. Mild basilar atelectasis. There is no pneumothorax. No gross fractures are noted.  IMPRESSION: No active disease.   Electronically Signed   By: Lahoma Crocker M.D.   On: 05/27/2015 20:14   Dg Abd Portable 1v  05/28/2015   CLINICAL DATA:  Abdominal and chest pain today.  EXAM: PORTABLE ABDOMEN - 1 VIEW; PORTABLE CHEST - 1 VIEW  COMPARISON:  Abdominal radiograph 05/19/2015. Chest x-ray 05/27/2015  FINDINGS: One view chest:  The heart is enlarged but stable. There is a left ventricular configuration. Minimal streaky atelectasis but no infiltrates or effusions. The bony thorax is intact.  One-view abdomen: Moderate persistent contrast in the right and proximal transverse colon. The rest of the colon is decompressed. Could not exclude a stricture. No obvious mass on the recent CT scan.  IMPRESSION: 1. Stable cardiac enlargement.  No acute  pulmonary findings. 2. Persistent stool and contrast distended right and proximal transverse colon with otherwise decompressed distal colon. Could not exclude a stricture. No obvious mass on the recent CT scan.   Electronically Signed   By: Marijo Sanes M.D.   On: 05/28/2015 09:30    Scheduled Meds: . apixaban  5 mg Oral BID  . atorvastatin  40 mg Oral q1800  . dicyclomine  10 mg  Oral QID  . insulin aspart  0-9 Units Subcutaneous TID WC  . linagliptin  5 mg Oral Daily  . midodrine  5 mg Oral TID WC  . morphine  60 mg Oral BID  . pantoprazole  40 mg Oral BID  . sorbitol, milk of mag, mineral oil, glycerin (SMOG) enema  960 mL Rectal Once  . sucralfate  1 g Oral QID   Continuous Infusions:  Antibiotics Given (last 72 hours)    None      Principal Problem:   Acute encephalopathy Active Problems:   Chronic pain syndrome   Paroxysmal atrial fibrillation   Diabetes type 2, uncontrolled   Symptomatic bradycardia   Elevated troponin   Chest pain    Time spent: 35 min    VANN, [facility] Pager [phone]. If 7PM-7AM, please contact night-coverage at [email or web address], password [facility] 05/29/2015, 9:18 AM  LOS: 2 days

## 2015-05-29 NOTE — Progress Notes (Signed)
Utilization Review Completed.Dowell, Deborah T9/18/2016  

## 2015-05-29 NOTE — Evaluation (Signed)
Physical Therapy Evaluation Patient Details Name: [name] MRN: [id number] DOB: October 15, 1955 Today's Date: 05/29/2015   History of Present Illness  Pt adm with acute encephaloplathy. Pt with frequent falls at home. PMH - syncope, HTN, chronic pain, afib, HTN, uterine CA  Clinical Impression  Pt admitted with above diagnosis and presents to PT with functional limitations due to deficits listed below (See PT problem list). Pt needs skilled PT to maximize independence and safety to allow discharge to home. Pt orthostatic with standing (see vitals) but not symptomatic. Expect falls at home may be related to this.     Follow Up Recommendations Home health PT;Supervision - Intermittent    Equipment Recommendations  None recommended by PT    Recommendations for Other Services       Precautions / Restrictions Precautions Precautions: Fall      Mobility  Bed Mobility Overal bed mobility: Modified Independent             General bed mobility comments: Incr time  Transfers Overall transfer level: Needs assistance Equipment used: Rolling walker (2 wheeled) Transfers: Sit to/from Stand Sit to Stand: Min guard         General transfer comment: Assist for satety and balance.  Ambulation/Gait Ambulation/Gait assistance: Min guard Ambulation Distance (Feet): 120 Feet Assistive device: Rolling walker (2 wheeled) Gait Pattern/deviations: Step-through pattern;Decreased stride length     General Gait Details: Assist for safety and balance.  Stairs            Wheelchair Mobility    Modified Rankin (Stroke Patients Only)       Balance Overall balance assessment: Needs assistance Sitting-balance support: No upper extremity supported;Feet supported Sitting balance-Leahy Scale: Good     Standing balance support: No upper extremity supported Standing balance-Leahy Scale: Fair                               Pertinent Vitals/Pain Pain Assessment:  No/denies pain    Home Living Family/patient expects to be discharged to:: Private residence Living Arrangements: Spouse/significant other Available Help at Discharge: Available 24 hours/day;Family Type of Home: Mobile home Home Access: Ramped entrance     Home Layout: One level Home Equipment: Freer - 2 wheels;Cane - single point      Prior Function Level of Independence: Independent         Comments: Pt reports she doesn't use walker much.     Hand Dominance   Dominant Hand: Right    Extremity/Trunk Assessment   Upper Extremity Assessment: Defer to OT evaluation           Lower Extremity Assessment: Generalized weakness      Cervical / Trunk Assessment: Normal  Communication   Communication: No difficulties  Cognition Arousal/Alertness: Awake/alert Behavior During Therapy: WFL for tasks assessed/performed Overall Cognitive Status: Within Functional Limits for tasks assessed       Memory: Decreased short-term memory              General Comments      Exercises        Assessment/Plan    PT Assessment Patient needs continued PT services  PT Diagnosis Difficulty walking;Generalized weakness   PT Problem List Decreased strength;Decreased activity tolerance;Decreased balance;Decreased mobility  PT Treatment Interventions DME instruction;Gait training;Functional mobility training;Therapeutic activities;Therapeutic exercise;Balance training;Patient/family education   PT Goals (Current goals can be found in the Care Plan section) Acute Rehab PT Goals Patient Stated Goal: go  home PT Goal Formulation: With patient Time For Goal Achievement: 06/05/15 Potential to Achieve Goals: Good    Frequency Min 3X/week   Barriers to discharge        Co-evaluation               End of Session Equipment Utilized During Treatment: Gait belt Activity Tolerance: Patient tolerated treatment well Patient left: in chair;with call bell/phone within  reach;with chair alarm set Nurse Communication: Mobility status (orthostatic)         Time: 2563-8937 PT Time Calculation (min) (ACUTE ONLY): 30 min   Charges:   PT Evaluation $Initial PT Evaluation Tier I: 1 Procedure     PT G Codes:        MAYCOCK,CARY 06-26-2015, 11:49 AM  Suanne Marker PT [phone]

## 2015-05-29 NOTE — Progress Notes (Signed)
Subjective:  "I'm ready to go home " Hypotension noted. Drop of 60 mmHg when walking. BP meds currently held. Amiodarone being held. Heart rate was 41 sinus bradycardia in emergency room.  She presented with fatigue, unsteadiness, witnessed falls. She was just discharged in earlier September for syncope. Midsternal chest pain. Troponin was mildly elevated at 0.19 0.67, 0.53 -flat trend.  Echo showed normal ejection fraction earlier this month.  Objective:  Vital Signs in the last 24 hours: Temp:  [97.7 F (36.5 C)-98.8 F (37.1 C)] 98.8 F (37.1 C) (09/18 0730) Pulse Rate:  [87-117] 117 (09/18 0927) Resp:  [8-21] 21 (09/18 0553) BP: (70-129)/(43-70) 108/57 mmHg (09/18 0927) SpO2:  [98 %] 98 % (09/18 0553)  Intake/Output from previous day: 09/17 0701 - 09/18 0700 In: 1520 [P.O.:480; I.V.:1040] Out: -    Physical Exam: General: Thin, in no acute distress. Head:  Normocephalic and atraumatic. Lungs: Clear to auscultation and percussion. Heart: Normal S1 and S2.  No murmur, rubs or gallops.  Abdomen: soft, non-tender, positive bowel sounds. Extremities: No clubbing or cyanosis. No edema. Neurologic: Alert and oriented x 3.    Lab Results:  Recent Labs  05/27/15 1657 05/29/15 0305  WBC 8.5 16.6*  HGB 13.4 12.2  PLT 149* 156    Recent Labs  05/27/15 1657 05/29/15 0305  NA 132* 140  K 4.6 3.2*  CL 97* 110  CO2 24 22  GLUCOSE 266* 141*  BUN 14 19  CREATININE 1.05* 1.61*    Recent Labs  05/28/15 0450 05/28/15 1635  TROPONINI 0.53* 0.20*   Hepatic Function Panel  Recent Labs  05/27/15 2004  PROT 6.1*  ALBUMIN 3.2*  AST 30  ALT 29  ALKPHOS 71  BILITOT 1.0  BILIDIR 0.3  IBILI 0.7   No results for input(s): CHOL in the last 72 hours. No results for input(s): PROTIME in the last 72 hours.  Imaging: Ct Head Wo Contrast  05/27/2015   CLINICAL DATA:  59 year old female with fall  EXAM: CT HEAD WITHOUT CONTRAST  CT CERVICAL SPINE WITHOUT  CONTRAST  TECHNIQUE: Multidetector CT imaging of the head and cervical spine was performed following the standard protocol without intravenous contrast. Multiplanar CT image reconstructions of the cervical spine were also generated.  COMPARISON:  None.  FINDINGS: CT HEAD FINDINGS  The ventricles and sulci are appropriate in size for patient's age. Mild periventricular and deep white matter hypodensities represent chronic microvascular ischemic changes. There is no intracranial hemorrhage. No mass effect or midline shift identified.  The visualized paranasal sinuses and mastoid air cells are well aerated. The calvarium is intact.  CT CERVICAL SPINE FINDINGS  There is no acute fracture or subluxation of the cervical spine.Mild multilevel degenerative changes.The odontoid and spinous processes are intact.There is normal anatomic alignment of the C1-C2 lateral masses. The visualized soft tissues appear unremarkable.  IMPRESSION: No acute intracranial hemorrhage.  Mild chronic microvascular ischemic changes.  No acute/traumatic cervical spine pathology.   Electronically Signed   By: Anner Crete M.D.   On: 05/27/2015 18:23   Ct Cervical Spine Wo Contrast  05/27/2015   CLINICAL DATA:  59 year old female with fall  EXAM: CT HEAD WITHOUT CONTRAST  CT CERVICAL SPINE WITHOUT CONTRAST  TECHNIQUE: Multidetector CT imaging of the head and cervical spine was performed following the standard protocol without intravenous contrast. Multiplanar CT image reconstructions of the cervical spine were also generated.  COMPARISON:  None.  FINDINGS: CT HEAD FINDINGS  The ventricles and sulci  are appropriate in size for patient's age. Mild periventricular and deep white matter hypodensities represent chronic microvascular ischemic changes. There is no intracranial hemorrhage. No mass effect or midline shift identified.  The visualized paranasal sinuses and mastoid air cells are well aerated. The calvarium is intact.  CT CERVICAL SPINE  FINDINGS  There is no acute fracture or subluxation of the cervical spine.Mild multilevel degenerative changes.The odontoid and spinous processes are intact.There is normal anatomic alignment of the C1-C2 lateral masses. The visualized soft tissues appear unremarkable.  IMPRESSION: No acute intracranial hemorrhage.  Mild chronic microvascular ischemic changes.  No acute/traumatic cervical spine pathology.   Electronically Signed   By: Anner Crete M.D.   On: 05/27/2015 18:23   Dg Chest Port 1 View  05/28/2015   CLINICAL DATA:  Abdominal and chest pain today.  EXAM: PORTABLE ABDOMEN - 1 VIEW; PORTABLE CHEST - 1 VIEW  COMPARISON:  Abdominal radiograph 05/19/2015. Chest x-ray 05/27/2015  FINDINGS: One view chest:  The heart is enlarged but stable. There is a left ventricular configuration. Minimal streaky atelectasis but no infiltrates or effusions. The bony thorax is intact.  One-view abdomen: Moderate persistent contrast in the right and proximal transverse colon. The rest of the colon is decompressed. Could not exclude a stricture. No obvious mass on the recent CT scan.  IMPRESSION: 1. Stable cardiac enlargement.  No acute pulmonary findings. 2. Persistent stool and contrast distended right and proximal transverse colon with otherwise decompressed distal colon. Could not exclude a stricture. No obvious mass on the recent CT scan.   Electronically Signed   By: Marijo Sanes M.D.   On: 05/28/2015 09:30   Dg Chest Portable 1 View  05/27/2015   CLINICAL DATA:  Fall today  EXAM: PORTABLE CHEST - 1 VIEW  COMPARISON:  05/18/2015  FINDINGS: Cardiomediastinal silhouette is stable. No infiltrate or pulmonary edema. Mild basilar atelectasis. There is no pneumothorax. No gross fractures are noted.  IMPRESSION: No active disease.   Electronically Signed   By: Lahoma Crocker M.D.   On: 05/27/2015 20:14   Dg Abd Portable 1v  05/28/2015   CLINICAL DATA:  Abdominal and chest pain today.  EXAM: PORTABLE ABDOMEN - 1 VIEW;  PORTABLE CHEST - 1 VIEW  COMPARISON:  Abdominal radiograph 05/19/2015. Chest x-ray 05/27/2015  FINDINGS: One view chest:  The heart is enlarged but stable. There is a left ventricular configuration. Minimal streaky atelectasis but no infiltrates or effusions. The bony thorax is intact.  One-view abdomen: Moderate persistent contrast in the right and proximal transverse colon. The rest of the colon is decompressed. Could not exclude a stricture. No obvious mass on the recent CT scan.  IMPRESSION: 1. Stable cardiac enlargement.  No acute pulmonary findings. 2. Persistent stool and contrast distended right and proximal transverse colon with otherwise decompressed distal colon. Could not exclude a stricture. No obvious mass on the recent CT scan.   Electronically Signed   By: Marijo Sanes M.D.   On: 05/28/2015 09:30   Personally viewed.   Telemetry: Sinus bradycardia originally, now currently sinus rhythm. Heart rate has improved.  EKG:   05/27/15 at 2043 Sinus rhythm 52, nonspecific ST T wave changes Personally viewed.   Cardiac Studies:  ECHO normal EF  Scheduled Meds: . apixaban  5 mg Oral BID  . atorvastatin  40 mg Oral q1800  . dicyclomine  10 mg Oral QID  . insulin aspart  0-9 Units Subcutaneous TID WC  . linagliptin  5 mg Oral  Daily  . midodrine  5 mg Oral TID WC  . morphine  60 mg Oral BID  . pantoprazole  40 mg Oral BID  . potassium chloride  40 mEq Oral Once  . sorbitol, milk of mag, mineral oil, glycerin (SMOG) enema  960 mL Rectal Once  . sucralfate  1 g Oral QID   Continuous Infusions:   PRN Meds:.lactulose   Assessment/Plan:  Principal Problem:   Acute encephalopathy Active Problems:   Chronic pain syndrome   Paroxysmal atrial fibrillation   Diabetes type 2, uncontrolled   Symptomatic bradycardia   Elevated troponin   Chest pain  58 year old female with recurrent syncopal episodes, likely from hypotension. Chronic pain syndrome. Recent paroxysmal atrial fibrillation.  Elevated troponin.  1. Elevated troponin-this is likely demand ischemia. Flat trend. Because of this, we will go ahead and discontinue IV heparin and place her back on Eliquis for her paroxysmal atrial fibrillation. This is not appear to be acute coronary syndrome. She does have an appointment with cardiology in [place] on the 21st. I think it would be reasonable as an outpatient to check a nuclear stress test, noninvasive evaluation to ensure that she does not have any high risk signs of ischemia.  2. Paroxysmal atrial fibrillation  - Agree with discontinuation of amiodarone. Clearly she had sinus bradycardia on admission. Her QTC is also quite prolonged in the 550 range. Avoid Zofran, Haldol or other QT prolonging agents. She is not showing any signs of ventricular tachycardia.  - Currently sinus rhythm. Sinus bradycardia has resolved.  3. Sinus bradycardia  - At the time of admission, heart rate in the 40s. With her hypotension and bradycardia, likely playing a role in her fall/syncopal event. However, her medications which included amiodarone, diltiazem, metoprolol 50 mg all could have potentiated her bradycardia. She was on 400 mg twice a day of amiodarone. This is been discontinued. No indication for pacemaker at this time.  4. Significant orthostatic Hypotension-drop of 60 mmHg. on Midodrine as outpatient. This seems to have been a chronic issue. Spoke with Dr. Eliseo Squires. Cortisol normal-no evidence currently of adrenal insufficiency. She does have subtle hyponatremia. Avoid antihypertensives. Salt liberalization. TED hose. Agree. Per Dr. Eliseo Squires. Question fludrocortisone? May consider once workup is complete. Hydrate. Not quite ready to go home yet.    SKAINS, [place] 05/29/2015, 10:14 AM

## 2015-05-30 ENCOUNTER — Encounter (HOSPITAL_COMMUNITY): Payer: Self-pay | Admitting: Physician Assistant

## 2015-05-30 DIAGNOSIS — R55 Syncope and collapse: Secondary | ICD-10-CM

## 2015-05-30 DIAGNOSIS — I480 Paroxysmal atrial fibrillation: Secondary | ICD-10-CM

## 2015-05-30 LAB — BASIC METABOLIC PANEL
ANION GAP: 7 (ref 5–15)
BUN: 14 mg/dL (ref 6–20)
CALCIUM: 8.5 mg/dL — AB (ref 8.9–10.3)
CHLORIDE: 105 mmol/L (ref 101–111)
CO2: 23 mmol/L (ref 22–32)
Creatinine, Ser: 1.33 mg/dL — ABNORMAL HIGH (ref 0.44–1.00)
GFR calc non Af Amer: 42 mL/min — ABNORMAL LOW (ref >60)
GFR, EST AFRICAN AMERICAN: 49 mL/min — AB (ref >60)
Glucose, Bld: 168 mg/dL — ABNORMAL HIGH (ref 65–99)
Potassium: 3.8 mmol/L (ref 3.5–5.1)
Sodium: 135 mmol/L (ref 135–145)

## 2015-05-30 LAB — GLUCOSE, CAPILLARY
GLUCOSE-CAPILLARY: 156 mg/dL — AB (ref 65–99)
GLUCOSE-CAPILLARY: 196 mg/dL — AB (ref 65–99)
Glucose-Capillary: 166 mg/dL — ABNORMAL HIGH (ref 65–99)
Glucose-Capillary: 163 mg/dL — ABNORMAL HIGH (ref 65–99)

## 2015-05-30 LAB — CBC
HEMATOCRIT: 34.8 % — AB (ref 36.0–46.0)
HEMOGLOBIN: 11.3 g/dL — AB (ref 12.0–15.0)
MCH: 31.7 pg (ref 26.0–34.0)
MCHC: 32.5 g/dL (ref 30.0–36.0)
MCV: 97.5 fL (ref 78.0–100.0)
Platelets: 180 10*3/uL (ref 150–400)
RBC: 3.57 MIL/uL — ABNORMAL LOW (ref 3.87–5.11)
RDW: 13.2 % (ref 11.5–15.5)
WBC: 11.9 10*3/uL — AB (ref 4.0–10.5)

## 2015-05-30 MED ORDER — MIDODRINE HCL 5 MG PO TABS
10.0000 mg | ORAL_TABLET | Freq: Three times a day (TID) | ORAL | Status: DC
  Administered 2015-05-30 – 2015-05-31 (×4): 10 mg via ORAL
  Filled 2015-05-30 (×3): qty 2

## 2015-05-30 MED ORDER — METOPROLOL SUCCINATE ER 25 MG PO TB24
25.0000 mg | ORAL_TABLET | Freq: Every day | ORAL | Status: DC
  Administered 2015-05-30 – 2015-05-31 (×2): 25 mg via ORAL
  Filled 2015-05-30 (×2): qty 1

## 2015-05-30 MED ORDER — SODIUM CHLORIDE 0.9 % IV SOLN
INTRAVENOUS | Status: DC
  Administered 2015-05-30: 15:00:00 via INTRAVENOUS

## 2015-05-30 MED ORDER — AMIODARONE HCL 200 MG PO TABS
200.0000 mg | ORAL_TABLET | Freq: Every day | ORAL | Status: DC
  Administered 2015-05-30 – 2015-05-31 (×2): 200 mg via ORAL
  Filled 2015-05-30 (×2): qty 1

## 2015-05-30 MED ORDER — CEFUROXIME AXETIL 500 MG PO TABS
500.0000 mg | ORAL_TABLET | Freq: Two times a day (BID) | ORAL | Status: DC
  Administered 2015-05-30 – 2015-05-31 (×3): 500 mg via ORAL
  Filled 2015-05-30 (×5): qty 1

## 2015-05-30 NOTE — Evaluation (Signed)
Occupational Therapy Evaluation Patient Details Name: [name] MRN: [id number] DOB: 1956-04-29 Today's Date: 05/30/2015    History of Present Illness Pt admitted with acute encephaloplathy. Pt with frequent falls at home. PMH - syncope, HTN, chronic pain, afib, HTN, uterine CA   Clinical Impression   Pt admitted with above. Pt independent with ADLs, PTA. Feel pt will benefit from acute OT to increase independence prior to d/c. Plan to practice simulated tub transfer next session.    Follow Up Recommendations  No OT follow up;Supervision - Intermittent    Equipment Recommendations   (3 in 1 versus shower seat )    Recommendations for Other Services       Precautions / Restrictions Precautions Precautions: Fall Precaution Comments: watch HR Restrictions Weight Bearing Restrictions: No      Mobility Bed Mobility               General bed mobility comments: sitting EOB  Transfers Overall transfer level: Needs assistance Equipment used: Rolling walker (2 wheeled) Transfers: Sit to/from Stand Sit to Stand: Supervision              Balance Overall balance assessment: History of Falls    Noted some decreased balance with ambulation with RW.                                      ADL Overall ADL's : Needs assistance/impaired     Grooming: Wash/dry face;Set up;Supervision/safety;Standing               Lower Body Dressing: Min guard;Sit to/from stand   Toilet Transfer: Min guard;Ambulation;RW (Supervision for sit to stand; sit to stand from bed/chair)           Functional mobility during ADLs: Min guard;Rolling walker General ADL Comments: Educated on energy conservation techniques and deep breathing technique. Educated on safety such as rugs/items on floor and safe footwear. Recommended pt elevate LEs as she talked about having swelling.     Vision     Perception     Praxis      Pertinent Vitals/Pain Pain  Assessment: No/denies pain; HR up to 130s in session but was around 120s at beginning of session. Instructed pt in deep breathing and HR would trend down some in session.     Hand Dominance Right   Extremity/Trunk Assessment Upper Extremity Assessment Upper Extremity Assessment: Overall WFL for tasks assessed   Lower Extremity Assessment Lower Extremity Assessment: Defer to PT evaluation       Communication Communication Communication: No difficulties   Cognition Arousal/Alertness: Awake/alert Behavior During Therapy: WFL for tasks assessed/performed Overall Cognitive Status: Within Functional Limits for tasks assessed                     General Comments       Exercises       Shoulder Instructions      Home Living Family/patient expects to be discharged to:: Private residence Living Arrangements: Spouse/significant other;Children Available Help at Discharge: Available 24 hours/day;Family Type of Home: Mobile home Home Access: Ramped entrance     Home Layout: One level     Bathroom Shower/Tub: Teacher, early years/pre: Standard (sink close)     Home Equipment: Environmental consultant - 2 wheels;Cane - single point          Prior Functioning/Environment Level of Independence: Independent  Comments: Pt reports she doesn't use walker much.    OT Diagnosis: Generalized weakness;Other (comment) (decreased balance)   OT Problem List: Decreased strength;Impaired balance (sitting and/or standing);Decreased activity tolerance;Decreased knowledge of precautions;Decreased knowledge of use of DME or AE   OT Treatment/Interventions: Self-care/ADL training;DME and/or AE instruction;Energy conservation;Therapeutic activities;Patient/family education;Balance training    OT Goals(Current goals can be found in the care plan section) Acute Rehab OT Goals Patient Stated Goal: go home today OT Goal Formulation: With patient Time For Goal Achievement:  06/06/15 Potential to Achieve Goals: Good ADL Goals Pt Will Perform Lower Body Dressing: sit to/from stand;with modified independence Pt Will Transfer to Toilet: with modified independence;ambulating;regular height toilet;grab bars Pt Will Perform Tub/Shower Transfer: Tub transfer;with supervision;ambulating;rolling walker (tub equipment TBD)  OT Frequency: Min 2X/week   Barriers to D/C:            Co-evaluation              End of Session Equipment Utilized During Treatment: Gait belt;Rolling walker Nurse Communication: Other (comment) (elevated HR-notified tech and nursing student)  Activity Tolerance: Other (comment) (elevated HR) Patient left: in chair;with call bell/phone within reach;with chair alarm set;Other (comment) (nursing student going in as I left)   Time: 5320-2334 OT Time Calculation (min): 16 min Charges:  OT General Charges $OT Visit: 1 Procedure OT Evaluation $Initial OT Evaluation Tier I: 1 Procedure G-CodesBenito Mccreedy OTR/L [phone] 05/30/2015, 9:16 AM

## 2015-05-30 NOTE — Progress Notes (Signed)
PROGRESS NOTE  [name] DDU:[id number] DOB: Oct 13, 1955 DOA: 05/27/2015 PCP: Alvester Chou, NP  Assessment/Plan: Acute encephalopathy - resolved -suspect related to hypotension +/- multiple medications  Orthostatic hypotension -dropped 60 points when standing, asymptomatic 9/18 -add TED hose -cortisol ok-midodrine increase -recheck in AM  Chest pain with elevated troponin concerning for ACS. -  -Cardiology has been consulted and signed off-- follow up outpatient -resume eliquis  Prolonged QtC -monitor -avoid prolonging agents- celexa d/c'd, zofran d/c'd  Abdominal pain -on xray, it appears patient has large stool burden -having liquid stools- c diff negative -refused enema  Sinus bradycardia with history of recurrent syncope - beta blockers Cardizem and amiodarone hold.  TSH 4.609  Hypotension- resolved -IVF bolus -cortisol ok  Diabetes mellitus type 2 - will continue Januvia. Sliding scale coverage. Hold metformin while inpatient.  Chronic pain syndrome - reduce morphine  Hypertension - holding off Cardizem and beta blockers due to bradycardia and hypotension  Paroxysmal atrial fibrillation -  - in sinus-- but fast -off rate controlling meds -continue eliquis -tachy when up with PT  Elevated CR -trend  Leukocytosis -trend -no fevers  Hypokalemia -replete  diphtheroides UTI -ceftin x 7 days  Code Status: full Family Communication: patient Disposition Plan: PT Eval   Consultants:  cards  Procedures:      HPI/Subjective: Really wants to go home  Objective: Filed Vitals:   05/30/15 0500  BP: 132/67  Pulse: 96  Temp: 98.6 F (37 C)  Resp: 16    Intake/Output Summary (Last 24 hours) at 05/30/15 0856 Last data filed at 05/30/15 0500  Gross per 24 hour  Intake   1080 ml  Output    195 ml  Net    885 ml   Filed Weights   05/27/15 2300  Weight: 59.557 kg (131 lb 4.8 oz)    Exam:   General:  chronically ill  appearing  Cardiovascular: rrr  Respiratory: clear  Abdomen: +BS, NT  Musculoskeletal: no edema   Data Reviewed: Basic Metabolic Panel:  Recent Labs Lab 05/27/15 1657 05/27/15 2036 05/29/15 0305 05/30/15 0255  NA 132*  --  140 135  K 4.6  --  3.2* 3.8  CL 97*  --  110 105  CO2 24  --  22 23  GLUCOSE 266*  --  141* 168*  BUN 14  --  19 14  CREATININE 1.05*  --  1.61* 1.33*  CALCIUM 9.2  --  8.4* 8.5*  MG  --  1.7  --   --    Liver Function Tests:  Recent Labs Lab 05/27/15 2004  AST 30  ALT 29  ALKPHOS 71  BILITOT 1.0  PROT 6.1*  ALBUMIN 3.2*   No results for input(s): LIPASE, AMYLASE in the last 168 hours.  Recent Labs Lab 05/27/15 2005  AMMONIA 17   CBC:  Recent Labs Lab 05/27/15 1657 05/29/15 0305 05/30/15 0255  WBC 8.5 16.6* 11.9*  NEUTROABS 6.9  --   --   HGB 13.4 12.2 11.3*  HCT 38.3 36.3 34.8*  MCV 92.5 96.5 97.5  PLT 149* 156 180   Cardiac Enzymes:  Recent Labs Lab 05/28/15 0032 05/28/15 0450 05/28/15 1635  TROPONINI 0.67* 0.53* 0.20*   BNP (last 3 results)  Recent Labs  01/10/15 2300  BNP 62.5    ProBNP (last 3 results) No results for input(s): PROBNP in the last 8760 hours.  CBG:  Recent Labs Lab 05/29/15 0729 05/29/15 1125 05/29/15 1618 05/29/15 2210 05/30/15 0801  [place]    Recent Results (from the past 240 hour(s))  Urine culture     Status: None   Collection Time: 05/27/15  7:36 PM  Result Value Ref Range Status   Specimen Description URINE, RANDOM  Final   Special Requests NONE  Final   Culture   Final    >=100,000 COLONIES/mL DIPHTHEROIDS(CORYNEBACTERIUM SPECIES)   Report Status 05/29/2015 FINAL  Final  MRSA PCR Screening     Status: None   Collection Time: 05/27/15 10:50 PM  Result Value Ref Range Status   MRSA by PCR NEGATIVE NEGATIVE Final    Comment:        The GeneXpert MRSA Assay (FDA approved for NASAL specimens only), is one component of a comprehensive MRSA  colonization surveillance program. It is not intended to diagnose MRSA infection nor to guide or monitor treatment for MRSA infections.   C difficile quick scan w PCR reflex     Status: Abnormal   Collection Time: 05/28/15  3:05 PM  Result Value Ref Range Status   C Diff antigen POSITIVE (A) NEGATIVE Final   C Diff toxin NEGATIVE NEGATIVE Final   C Diff interpretation   Final    C. difficile present, but toxin not detected. This indicates colonization. In most cases, this does not require treatment. If patient has signs and symptoms consistent with colitis, consider treatment.     Studies: No results found.  Scheduled Meds: . apixaban  5 mg Oral BID  . atorvastatin  40 mg Oral q1800  . dicyclomine  10 mg Oral QID  . insulin aspart  0-9 Units Subcutaneous TID WC  . linagliptin  5 mg Oral Daily  . midodrine  10 mg Oral TID WC  . morphine  60 mg Oral BID  . pantoprazole  40 mg Oral BID  . sorbitol, milk of mag, mineral oil, glycerin (SMOG) enema  960 mL Rectal Once  . sucralfate  1 g Oral QID   Continuous Infusions: . sodium chloride     Antibiotics Given (last 72 hours)    None      Principal Problem:   Acute encephalopathy Active Problems:   Chronic pain syndrome   Paroxysmal atrial fibrillation   Diabetes type 2, uncontrolled   Symptomatic bradycardia   Elevated troponin   Chest pain    Time spent: 25 min    VANN, [employer] [facility] Pager [phone]. If 7PM-7AM, please contact night-coverage at [email or web address], password [facility] 05/30/2015, 8:56 AM  LOS: 3 days

## 2015-05-30 NOTE — Progress Notes (Addendum)
Patient ID: [name], female   DOB: Jan 30, 1956, 59 y.o.   MRN: [id number]    Subjective:  No complaints wants to go home   Objective:  Vital Signs in the last 24 hours: Temp:  [98 F (36.7 C)-99.1 F (37.3 C)] 98.6 F (37 C) (09/19 0500) Pulse Rate:  [96-117] 96 (09/19 0500) Resp:  [14-17] 16 (09/19 0500) BP: (108-132)/(55-67) 132/67 mmHg (09/19 0500) SpO2:  [96 %-98 %] 96 % (09/19 0500)  Intake/Output from previous day: 09/18 0701 - 09/19 0700 In: 1440 [P.O.:840; I.V.:600] Out: 195 [Urine:195]   Physical Exam: General: Thin, in no acute distress. Head:  Normocephalic and atraumatic. Lungs: Clear to auscultation and percussion. Heart: Normal S1 and S2.  SEM murmur, rubs or gallops.  Abdomen: soft, non-tender, positive bowel sounds. Extremities: No clubbing or cyanosis. No edema. Neurologic: Alert and oriented x 3.    Lab Results:  Recent Labs  05/29/15 0305 05/30/15 0255  WBC 16.6* 11.9*  HGB 12.2 11.3*  PLT 156 180    Recent Labs  05/29/15 0305 05/30/15 0255  NA 140 135  K 3.2* 3.8  CL 110 105  CO2 22 23  GLUCOSE 141* 168*  BUN 19 14  CREATININE 1.61* 1.33*    Recent Labs  05/28/15 0450 05/28/15 1635  TROPONINI 0.53* 0.20*   Hepatic Function Panel  Recent Labs  05/27/15 2004  PROT 6.1*  ALBUMIN 3.2*  AST 30  ALT 29  ALKPHOS 71  BILITOT 1.0  BILIDIR 0.3  IBILI 0.7      Telemetry:  Tachycardic rate 118-120  Regular   EKG:   05/27/15 at 2043 Sinus rhythm 52, nonspecific ST T wave changes Personally viewed.   Cardiac Studies:  ECHO normal EF reviewed   Scheduled Meds: . apixaban  5 mg Oral BID  . atorvastatin  40 mg Oral q1800  . dicyclomine  10 mg Oral QID  . insulin aspart  0-9 Units Subcutaneous TID WC  . linagliptin  5 mg Oral Daily  . midodrine  10 mg Oral TID WC  . morphine  60 mg Oral BID  . pantoprazole  40 mg Oral BID  . sorbitol, milk of mag, mineral oil, glycerin (SMOG) enema  960 mL Rectal Once  . sucralfate   1 g Oral QID   Continuous Infusions: . sodium chloride     PRN Meds:.lactulose   Assessment/Plan:  Principal Problem:   Acute encephalopathy Active Problems:   Chronic pain syndrome   Paroxysmal atrial fibrillation   Diabetes type 2, uncontrolled   Symptomatic bradycardia   Elevated troponin   Chest pain  59 year old female with recurrent syncopal episodes, likely from hypotension. Chronic pain syndrome. Recent paroxysmal atrial fibrillation. Elevated troponin.  1. Elevated troponin-this is likely demand ischemia. Flat trend. Heparon d/c . This is not appear to be acute coronary syndrome. She does have an appointment with cardiology in [place] on the 21st. I think it would be reasonable as an outpatient to check a nuclear stress test, noninvasive evaluation to ensure that she does not have any high risk signs of ischemia.  2. Paroxysmal atrial fibrillation  -Amiodarone d/c . Clearly she had sinus bradycardia on admission. Her QTC is also quite prolonged in the 550 range. Avoid Zofran, Haldol or other QT prolonging agents. She is not showing any signs of ventricular tachycardia. Bradycardia resolved  This am telemetry with tachycardia regular will do ECG ? afib or other will ask EP to see given tachy brady issues  3. Sinus bradycardia  - At the time of admission, heart rate in the 40s. With her hypotension and bradycardia, likely playing a role in her fall/syncopal event. However, her medications which included amiodarone, diltiazem, metoprolol 50 mg all could have potentiated her bradycardia. She was on 400 mg twice a day of amiodarone. This is been discontinued. No indication for pacemaker at this time.  4. Significant orthostatic Hypotension-drop of 60 mmHg. on Midodrine as outpatient. This seems to have been a chronic issue.  Cortisol normal-no evidence currently of adrenal insufficiency. She does have subtle hyponatremia. Avoid antihypertensives. Salt liberalization. TED hose   Increase midodrine to more typical dose 10 tid    Jenkins Rouge 05/30/2015, 8:39 AM

## 2015-05-30 NOTE — Consult Note (Addendum)
CARDIOLOGY CONSULT NOTE   Patient ID: [name] MRN: [id number] DOB/AGE: 1955/09/24 59 y.o.  Admit date: 05/27/2015  Primary Physician   Alvester Chou, NP Primary Cardiologist  Dr Rockey Situ (saw in [name] during July admission)  Reason for Consultation  Paroxysmal Atrial fibrillation, possible Tachy brady  [name] is a 59 y.o. year old female with a history of PAF on Eliquis, long QT (>500 since at least 2009 when HR < 100 in SR), DM, HTN, HL, hypothyroid, and bradycardia.   She has significant back problems and is on chronic narcotic therapy for this, MS Contin increased to 100 mg twice a day approximately 8 months ago.  Admitted 07/24>04/07/2015 to [facility] for N&V, gastritis. Prior to admission, had been on Toprol-XL 50 mg daily. Seen by cards for rapid afib and started on amiodarone 400 mg bid, to be decreased to 200 mg bid after 3 days and diltiazem CD 120 mg daily. However, amiodarone rx on the discharge summary does not reflect a dose decrease, and the patient continued to take 400 mg twice a day.    Admitted 09/07>>05/22/2015 for syncope felt secondary to orthostatic hypotension and bradycardia in the setting of high level narcotic use. She was placed on TED hose and midodrine and was to limit narcotic use. EF normal w/ grade 1 diastolic dysfunction by echo. GI saw for abdominal pain and had non-obstructive intussusception. Her heart rate was in the low 40s on admission, but no change was made to her cardiac drugs, Cardizem CD 120 mg qd, Amiodarone 400 mg bid and Toprol XL 50 mg qd.  Admitted 09/16 for sinus brady, HR 30s-40s, frequent falls and lethargy. Pt had continuous chest pain x 3 days, and a mildly elevated troponin, felt demand ischemia. OP nuc recommended. Amiodarone, diltiazem, and Toprol-XL were all discontinued.   Since then, she has had some tachycardia, with heart rates up into the 140s, but it all seems sinus on telemetry review. She has had no episodes of  atrial fibrillation.  She had significant palpitations with her atrial fibrillation. She states she has had 3 or 4 episodes of those palpitations when at home, but has had none here in the hospital.  In talking to the patient, the orthostatic lightheadedness started about 6 months ago. A month or 2 before that, her MS Contin was increased to 100 mg twice a day. Her symptoms have gradually worsened and she states she does not remember much about that day she came in. Of note, her cortisol level was normal. She does not recall any difference between going from lying to standing versus going from sitting to standing. Orthostatic vital signs were performed 09/18, results below. They were significantly positive.    BP- Lying     123/60     Pulse- Lying     108     BP- Sitting     104/65     Pulse- Sitting     115     BP- Standing at 0 minutes     68/52     Pulse- Standing at 0 minutes     120     BP- Standing at 3 minutes     79/52     Pulse- Standing at 3 minutes     125         Her midodrine was increased and orthostatic VS are to be rechecked after she gets the increased dose. AM Cortisol level x 1 and random Cortisol  levels x 3 have all been normal.  Past Medical History  Diagnosis Date  . Type 1 diabetes   . Hypothyroid   . Degenerative disk disease   . Stomach ulcer   . Diverticulitis   . Syncope 01/2015  . Anxiety   . GERD (gastroesophageal reflux disease)   . History of hiatal hernia   . Cancer     HX OF CANCER OF UTERUS   . TIA (transient ischemic attack) 02/2015  . Hypertension   . Pancreatitis   . PAF (paroxysmal atrial fibrillation) 03/2015    a. new onset 03/2015 in setting of intractable N/V; b. on Eliquis 5 mg bid; c. CHADSVASc 4 (DM, TIA x 2, female)  . Intussusception intestine 05/2015     Past Surgical History  Procedure Laterality Date  . Hernia repair    . Abdominal hysterectomy    . Cholecystectomy    . Esophagogastroduodenoscopy N/A 04/04/2015    Procedure:  ESOPHAGOGASTRODUODENOSCOPY (EGD);  Surgeon: Hulen Luster, MD;  [facility] END[facility]COPY;  Service: Endoscopy;  Laterality: N/A;    Allergies  Allergen Reactions  . Erythromycin Base Other (See Comments)    Reaction:  Fever   . Rosiglitazone Maleate Swelling  . Codeine Sulfate Rash  . Tetanus-Diphtheria Toxoids Td Rash and Other (See Comments)    Reaction:  Fever     I have reviewed the patient's current medications . apixaban  5 mg Oral BID  . atorvastatin  40 mg Oral q1800  . cefUROXime  500 mg Oral BID WC  . dicyclomine  10 mg Oral QID  . insulin aspart  0-9 Units Subcutaneous TID WC  . linagliptin  5 mg Oral Daily  . midodrine  10 mg Oral TID WC  . morphine  60 mg Oral BID  . pantoprazole  40 mg Oral BID  . sorbitol, milk of mag, mineral oil, glycerin (SMOG) enema  960 mL Rectal Once  . sucralfate  1 g Oral QID   . sodium chloride 100 mL/hr at 05/30/15 0934   lactulose  Medication Sig  amiodarone (PACERONE) 400 MG tablet Take 1 tablet (400 mg total) by mouth 2 (two) times daily.  apixaban (ELIQUIS) 5 MG TABS tablet Take 1 tablet (5 mg total) by mouth 2 (two) times daily.  atorvastatin (LIPITOR) 40 MG tablet Take 1 tablet (40 mg total) by mouth daily at 6 PM.  citalopram (CELEXA) 20 MG tablet Take 1 tablet by mouth daily.  cloNIDine (CATAPRES) 0.1 MG tablet Take 1 tablet (0.1 mg total) by mouth 3 (three) times daily as needed. Patient taking differently: Take 0.1 mg by mouth daily.   collagenase (SANTYL) ointment Apply topically daily.  dicyclomine (BENTYL) 10 MG capsule Take 1 capsule by mouth 4 (four) times daily.  diltiazem (CARDIZEM CD) 120 MG 24 hr capsule Take 1 capsule (120 mg total) by mouth daily. Patient taking differently: Take 120 mg by mouth at bedtime.   diphenhydrAMINE (BENADRYL) 25 mg capsule Take 2 capsules (50 mg total) by mouth every 6 (six) hours as needed. Patient taking differently: Take 50 mg by mouth every 6 (six) hours as needed for itching or  allergies.   HYDROcodone-acetaminophen (NORCO/VICODIN) 5-325 MG per tablet Take 1 tablet by mouth every 6 (six) hours as needed for moderate pain.  JANUVIA 100 MG tablet Take 100 mg by mouth daily.  lisinopril (PRINIVIL,ZESTRIL) 5 MG tablet Take 1 tablet by mouth daily.  metFORMIN (GLUCOPHAGE) 1000 MG tablet Take 1 tablet by mouth 2 (  two) times daily.  metoCLOPramide (REGLAN) 10 MG tablet Take 1 tablet (10 mg total) by mouth 4 (four) times daily -  before meals and at bedtime.  metoprolol succinate (TOPROL-XL) 50 MG 24 hr tablet Take 1 tablet (50 mg total) by mouth daily.  midodrine (PROAMATINE) 5 MG tablet Take 1 tablet (5 mg total) by mouth 3 (three) times daily with meals.  morphine (MS CONTIN) 100 MG 12 hr tablet Take 100 mg by mouth 2 (two) times daily.   pantoprazole (PROTONIX) 40 MG tablet Take 1 tablet (40 mg total) by mouth 2 (two) times daily.  sucralfate (CARAFATE) 1 G tablet Take 1 tablet (1 g total) by mouth 4 (four) times daily.  tiZANidine (ZANAFLEX) 4 MG tablet Take 1 tablet by mouth every 6 (six) hours as needed for muscle spasms.   zolpidem (AMBIEN) 5 MG tablet Take 1 tablet by mouth at bedtime.  loperamide (IMODIUM A-D) 2 MG tablet Take 2 tablets (4 mg total) by mouth 4 (four) times daily as needed for diarrhea or loose stools. Patient not taking: Reported on 04/13/2015  ondansetron (ZOFRAN ODT) 8 MG disintegrating tablet Take 1 tablet (8 mg total) by mouth every 8 (eight) hours as needed for nausea or vomiting. Patient not taking: Reported on 05/27/2015     Social History   Social History  . Marital Status: Married    Spouse Name: N/A  . Number of Children: N/A  . Years of Education: N/A   Occupational History  . Disabled 2nd back problems    Social History Main Topics  . Smoking status: Never Smoker   . Smokeless tobacco: Never Used  . Alcohol Use: No  . Drug Use: No  . Sexual Activity: No   Other Topics Concern  . Not on file   Social History Narrative    Lives in [place], [place] with her husband and 2 sons.    Family Status  Relation Status Death Age  . Mother Alive     born 57  . Father Deceased    Family History  Problem Relation Age of Onset  . Hypertension Mother   . CAD Sister      ROS:  Full 14 point review of systems complete and found to be negative unless listed above.  Physical Exam: Blood pressure 154/67, pulse 101, temperature 98.5 F (36.9 C), temperature source Oral, resp. rate 16, height 5' 3"  (1.6 m), weight 131 lb 4.8 oz (59.557 kg), SpO2 97 %.  General: Well developed, well nourished, female in no acute distress Head: Eyes PERRLA, No xanthomas.   Normocephalic and atraumatic, oropharynx without edema or exudate. Dentition: poor Lungs: few rales bases, good air exchange Heart: HRRR S1 S2, no rub/gallop,2/6 murmur. pulses are 2+ all 4 extrem.   Neck: No carotid bruits. No lymphadenopathy.  JVD not elevated. Abdomen: Bowel sounds present, abdomen soft and non-tender without masses or hernias noted. Msk:  No spine or cva tenderness. No weakness, no joint deformities or effusions. Extremities: No clubbing or cyanosis. No edema.  Neuro: Alert and oriented X 3. No focal deficits noted. Tremor noted, worse on Left. Psych:  Good affect, responds appropriately Skin: No rashes or lesions noted.  Labs:   Lab Results  Component Value Date   WBC 11.9* 05/30/2015   HGB 11.3* 05/30/2015   HCT 34.8* 05/30/2015   MCV 97.5 05/30/2015   PLT 180 05/30/2015    Recent Labs  05/28/15 1100  INR 2.17*    Recent Labs Lab 05/27/15  2004  05/30/15 0255  NA  --   < > 135  K  --   < > 3.8  CL  --   < > 105  CO2  --   < > 23  BUN  --   < > 14  CREATININE  --   < > 1.33*  CALCIUM  --   < > 8.5*  PROT 6.1*  --   --   BILITOT 1.0  --   --   ALKPHOS 71  --   --   ALT 29  --   --   AST 30  --   --   GLUCOSE  --   < > 168*  ALBUMIN 3.2*  --   --   < > = values in this interval not displayed. MAGNESIUM  Date Value Ref  Range Status  05/27/2015 1.7 1.7 - 2.4 mg/dL Final    Recent Labs  05/28/15 0032 05/28/15 0450 05/28/15 1635  TROPONINI 0.67* 0.53* 0.20*    Recent Labs  05/27/15 2005  TROPIPOC 0.19*   TSH  Date/Time Value Ref Range Status  05/27/2015 05:40 PM 4.609* 0.350 - 4.500 uIU/mL Final  01/12/2015 05:45 AM 4.460 0.350 - 4.500 uIU/mL Final   Echo 05/20/2015 Study Conclusions - Left ventricle: The cavity size was normal. Wall thickness was normal. Systolic function was normal. The estimated ejection fraction was in the range of 60% to 65%. Wall motion was normal; there were no regional wall motion abnormalities. Doppler parameters are consistent with abnormal left ventricular relaxation (grade 1 diastolic dysfunction).  ECG:  05/30/2015 SR Vent. rate 96 BPM PR interval 180 ms QRS duration 88 ms QT/QTc 416/525 ms P-R-T axes 21 44 108  Radiology:   Ct Head Wo Contrast 05/27/2015   CLINICAL DATA:  59 year old female with fall  EXAM: CT HEAD WITHOUT CONTRAST  CT CERVICAL SPINE WITHOUT CONTRAST  TECHNIQUE: Multidetector CT imaging of the head and cervical spine was performed following the standard protocol without intravenous contrast. Multiplanar CT image reconstructions of the cervical spine were also generated.  COMPARISON:  None.  FINDINGS: CT HEAD FINDINGS  The ventricles and sulci are appropriate in size for patient's age. Mild periventricular and deep white matter hypodensities represent chronic microvascular ischemic changes. There is no intracranial hemorrhage. No mass effect or midline shift identified.  The visualized paranasal sinuses and mastoid air cells are well aerated. The calvarium is intact.  CT CERVICAL SPINE FINDINGS  There is no acute fracture or subluxation of the cervical spine.Mild multilevel degenerative changes.The odontoid and spinous processes are intact.There is normal anatomic alignment of the C1-C2 lateral masses. The visualized soft tissues appear  unremarkable.  IMPRESSION: No acute intracranial hemorrhage.  Mild chronic microvascular ischemic changes.  No acute/traumatic cervical spine pathology.   Electronically Signed   By: Anner Crete M.D.   On: 05/27/2015 18:23   Dg Chest Port 1 View 05/28/2015   CLINICAL DATA:  Abdominal and chest pain today.  EXAM: PORTABLE ABDOMEN - 1 VIEW; PORTABLE CHEST - 1 VIEW  COMPARISON:  Abdominal radiograph 05/19/2015. Chest x-ray 05/27/2015  FINDINGS: One view chest:  The heart is enlarged but stable. There is a left ventricular configuration. Minimal streaky atelectasis but no infiltrates or effusions. The bony thorax is intact.  One-view abdomen: Moderate persistent contrast in the right and proximal transverse colon. The rest of the colon is decompressed. Could not exclude a stricture. No obvious mass on the recent CT scan.  IMPRESSION: 1.  Stable cardiac enlargement.  No acute pulmonary findings. 2. Persistent stool and contrast distended right and proximal transverse colon with otherwise decompressed distal colon. Could not exclude a stricture. No obvious mass on the recent CT scan.   Electronically Signed   By: Marijo Sanes M.D.   On: 05/28/2015 09:30    ASSESSMENT AND PLAN:   The patient was seen today by Dr. Lovena Le, the patient evaluated and the data reviewed.    Paroxysmal atrial fibrillation - none since admission, but rate was elevated and she was symptomatic when it occurred - This was in the setting of an acute illness. - she has had some palpitations after amio started, but none in hospital - MD advise on keeping off amiodarone - may need event monitor to assess the true afib burden    Symptomatic bradycardia - improved off BB/CCB - she had tolerated Toprol XL 50 mg as OP - since HR has been elevated here, MD advise on restarting BB, possibly at a lower dose and follow on telemetry  Otherwise, per IM/Cards   Elevated troponin - possibly demand ischemia - for MV, can be done as OP - F/U  in [place], reschedule 09/21 appt.    Orthostatic Hypotension - Midodrine increased - normal cortisol, both am and random  Principal Problem:   Acute encephalopathy Active Problems:   Chronic pain syndrome   Diabetes type 2, uncontrolled   Chest pain   Narcotic dependence   SignedLenoard Aden 05/30/2015 10:16 AM Beeper [phone]  EP Attending  Patient seen and examined. I concur with the findings as documented above by Rosaria Ferries, PA-C. The patient has had both brady and tachycardia and PAF. Her heart rates in the hospital are currently good but her drugs have been held. I would suggest restarting amiodarone 200/daily and toprol 25 mg daily. She may ultimately require PPM. Ok for discharge home tomorrow if stable today. Agree with Midodrine for orthostasis.  F/u Dr. Candis Musa.   Mikle Bosworth.D.

## 2015-05-31 DIAGNOSIS — R0789 Other chest pain: Secondary | ICD-10-CM

## 2015-05-31 LAB — CBC
HCT: 34.5 % — ABNORMAL LOW (ref 36.0–46.0)
Hemoglobin: 11.4 g/dL — ABNORMAL LOW (ref 12.0–15.0)
MCH: 31.8 pg (ref 26.0–34.0)
MCHC: 33 g/dL (ref 30.0–36.0)
MCV: 96.1 fL (ref 78.0–100.0)
PLATELETS: 142 10*3/uL — AB (ref 150–400)
RBC: 3.59 MIL/uL — ABNORMAL LOW (ref 3.87–5.11)
RDW: 12.8 % (ref 11.5–15.5)
WBC: 6.9 10*3/uL (ref 4.0–10.5)

## 2015-05-31 LAB — BASIC METABOLIC PANEL
ANION GAP: 7 (ref 5–15)
BUN: 9 mg/dL (ref 6–20)
CO2: 22 mmol/L (ref 22–32)
Calcium: 8.6 mg/dL — ABNORMAL LOW (ref 8.9–10.3)
Chloride: 110 mmol/L (ref 101–111)
Creatinine, Ser: 0.95 mg/dL (ref 0.44–1.00)
GFR calc Af Amer: >60 mL/min (ref >60)
GLUCOSE: 181 mg/dL — AB (ref 65–99)
Potassium: 4.4 mmol/L (ref 3.5–5.1)
Sodium: 139 mmol/L (ref 135–145)

## 2015-05-31 LAB — GLUCOSE, CAPILLARY
Glucose-Capillary: 165 mg/dL — ABNORMAL HIGH (ref 65–99)
Glucose-Capillary: 175 mg/dL — ABNORMAL HIGH (ref 65–99)

## 2015-05-31 MED ORDER — MORPHINE SULFATE ER 60 MG PO TBCR: 60.0000 mg | EXTENDED_RELEASE_TABLET | Freq: Two times a day (BID) | ORAL | Status: DC

## 2015-05-31 MED ORDER — MIDODRINE HCL 10 MG PO TABS: 10.0000 mg | ORAL_TABLET | Freq: Three times a day (TID) | ORAL | Status: DC

## 2015-05-31 MED ORDER — AMIODARONE HCL 200 MG PO TABS: 200.0000 mg | ORAL_TABLET | Freq: Every day | ORAL | Status: DC

## 2015-05-31 MED ORDER — LACTULOSE 10 GM/15ML PO SOLN: 20.0000 g | Freq: Two times a day (BID) | ORAL | Status: DC | PRN

## 2015-05-31 MED ORDER — METOPROLOL SUCCINATE ER 25 MG PO TB24: 25.0000 mg | ORAL_TABLET | Freq: Every day | ORAL | Status: DC

## 2015-05-31 MED ORDER — CEFUROXIME AXETIL 500 MG PO TABS: 500.0000 mg | ORAL_TABLET | Freq: Two times a day (BID) | ORAL | Status: DC

## 2015-05-31 NOTE — Progress Notes (Signed)
Patient ID: [name], female   DOB: Aug 08, 1956, 59 y.o.   MRN: [id number] Patient ID: [name], female   DOB: Oct 07, 1955, 59 y.o.   MRN: [id number]    Subjective:  No complaints wants to go home   Objective:  Vital Signs in the last 24 hours: Temp:  [98.1 F (36.7 C)-98.8 F (37.1 C)] 98.8 F (37.1 C) (09/20 0720) Pulse Rate:  [64-101] 73 (09/20 0720) Resp:  [16-18] 18 (09/20 0720) BP: (137-189)/(65-80) 183/80 mmHg (09/20 0720) SpO2:  [95 %-98 %] 95 % (09/20 0720)  Intake/Output from previous day: 09/19 0701 - 09/20 0700 In: 1520 [P.O.:870; I.V.:650] Out: 1007 [Urine:1475]   Physical Exam: General: Thin, in no acute distress. Head:  Normocephalic and atraumatic. Lungs: Clear to auscultation and percussion. Heart: Normal S1 and S2.  SEM murmur, rubs or gallops.  Abdomen: soft, non-tender, positive bowel sounds. Extremities: No clubbing or cyanosis. No edema. Neurologic: Alert and oriented x 3.    Lab Results:  Recent Labs  05/30/15 0255 05/31/15 0433  WBC 11.9* 6.9  HGB 11.3* 11.4*  PLT 180 142*    Recent Labs  05/30/15 0255 05/31/15 0433  NA 135 139  K 3.8 4.4  CL 105 110  CO2 23 22  GLUCOSE 168* 181*  BUN 14 9  CREATININE 1.33* 0.95    Recent Labs  05/28/15 1635  TROPONINI 0.20*   Hepatic Function Panel No results for input(s): PROT, ALBUMIN, AST, ALT, ALKPHOS, BILITOT, BILIDIR, IBILI in the last 72 hours.    Telemetry:  SR rates 70    EKG:   05/27/15 at 2043 Sinus rhythm 52, nonspecific ST T wave changes Personally viewed.   Cardiac Studies:  ECHO normal EF reviewed   Scheduled Meds: . amiodarone  200 mg Oral Daily  . apixaban  5 mg Oral BID  . atorvastatin  40 mg Oral q1800  . cefUROXime  500 mg Oral BID WC  . dicyclomine  10 mg Oral QID  . insulin aspart  0-9 Units Subcutaneous TID WC  . linagliptin  5 mg Oral Daily  . metoprolol succinate  25 mg Oral Daily  . midodrine  10 mg Oral TID WC  . morphine  60 mg Oral BID  .  pantoprazole  40 mg Oral BID  . sorbitol, milk of mag, mineral oil, glycerin (SMOG) enema  960 mL Rectal Once  . sucralfate  1 g Oral QID   Continuous Infusions: . sodium chloride 100 mL/hr at 05/30/15 1510   PRN Meds:.lactulose   Assessment/Plan:  Principal Problem:   Acute encephalopathy Active Problems:   Chronic pain syndrome   Paroxysmal atrial fibrillation   Diabetes type 2, uncontrolled   Symptomatic bradycardia   Elevated troponin   Chest pain  59 year old female with recurrent syncopal episodes, likely from hypotension. Chronic pain syndrome. Recent paroxysmal atrial fibrillation. Elevated troponin.  1. Elevated troponin-this is likely demand ischemia. Flat trend. Heparon d/c . This is not appear to be acute coronary syndrome. She does have an appointment with cardiology in [place] on the 21st. I think it would be reasonable as an outpatient to check a nuclear stress test, noninvasive evaluation to ensure that she does not have any high risk signs of ischemia.  2. Paroxysmal atrial fibrillation  - See EP note yesterday started back on low dose amiodarone yesterday follow QT  3. Sinus bradycardia  - resolved started back on low dose amiodarone and beta blocker per EP yesterday  4. Significant  orthostatic Hypotension-drop of 60 mmHg. on Midodrine as outpatient. This seems to have been a chronic issue.  Cortisol normal-no evidence currently of adrenal insufficiency. She does have subtle hyponatremia. Avoid antihypertensives. Salt liberalization. TED hose  Increase midodrine to more typical dose 10 tid  Rhythm much better this am ready for d/c   Jenkins Rouge 05/31/2015, 8:44 AM

## 2015-05-31 NOTE — Progress Notes (Signed)
Occupational Therapy Treatment Patient Details Name: [name] MRN: [id number] DOB: 11/19/1955 Today's Date: 05/31/2015    History of present illness Pt admitted with acute encephaloplathy. Pt with frequent falls at home. PMH - syncope, HTN, chronic pain, afib, HTN, uterine CA   OT comments  Patient making progress towards OT goals. Patient reports she is going home today. Recommend shower chair to increase safety at home.  Follow Up Recommendations  No OT follow up;Supervision - Intermittent    Equipment Recommendations  Tub/shower seat    Recommendations for Other Services      Precautions / Restrictions Precautions Precautions: Fall Restrictions Weight Bearing Restrictions: No       Mobility Bed Mobility Overal bed mobility: Modified Independent Bed Mobility: Supine to Sit              Transfers Overall transfer level: Needs assistance Equipment used: Rolling walker (2 wheeled) Transfers: Sit to/from Stand Sit to Stand: Supervision;Min guard         General transfer comment: Assist for satety and balance.    Balance                                   ADL Overall ADL's : Needs assistance/impaired                     Lower Body Dressing: Min guard;Sit to/from stand   Toilet Transfer: Min guard;Ambulation;BSC       Tub/ Shower Transfer: Tub transfer;Minimal assistance;Shower seat   Functional mobility during ADLs: Min guard;Rolling walker General ADL Comments: Practiced tub transfer side step over tub holding wall. Patient with loss of balance/unsteadiness. Recommend shower seat with modified technique of sitting and then swinging legs over side of tub. Patient's tub/shower has a curtain. Patient in agreement. If patient cannot afford insurance co-pay for shower chair, educated patient to have her husband hold on to her while she gets in and out of the shower. Patient reports she just toileted herself via [facility] without calling  for nurse. Note urine all over floor and patient reports that she "almost didn't make it." Cleaned urine off of the floor.       Vision                     Perception     Praxis      Cognition   Behavior During Therapy: WFL for tasks assessed/performed Overall Cognitive Status: Within Functional Limits for tasks assessed                       Extremity/Trunk Assessment               Exercises     Shoulder Instructions       General Comments      Pertinent Vitals/ Pain       Pain Assessment: No/denies pain  Home Living                                          Prior Functioning/Environment              Frequency Min 2X/week     Progress Toward Goals  OT Goals(current goals can now be found in the care plan section)  Progress towards OT goals: Progressing toward goals  Acute Rehab OT Goals Patient Stated Goal: go home today  Plan Discharge plan remains appropriate    Co-evaluation                 End of Session     Activity Tolerance Patient tolerated treatment well   Patient Left in bed;with call bell/phone within reach   Nurse Communication          Time: 1103-1594 OT Time Calculation (min): 14 min  Charges: OT General Charges $OT Visit: 1 Procedure OT Treatments $Self Care/Home Management : 8-22 mins  Early, Leila A 05/31/2015, 10:32 AM

## 2015-05-31 NOTE — Care Management Note (Signed)
Case Management Note  Patient Details  Name: [name] MRN: [id number] Date of Birth: [birth year]  Subjective/Objective: Pt plan for d/c today. Pt is active with [facility].                    Action/Plan: CM did call [facility] to make them aware that pt will be d/c today and SOC to begin within 24-48 hours of d/c. No further needs from CM at this time.     Expected Discharge Date:                  Expected Discharge Plan:  [place]  In-House Referral:     Discharge planning Services  CM Consult  Post Acute Care Choice:    Choice offered to:  NA  DME Arranged:  N/A DME Agency:  NA  HH Arranged:  RN, PT [place] Agency:  Other - See comment ([facility]. )  Status of Service:  Completed, signed off  Medicare Important Message Given:  Yes-third notification given Date Medicare IM Given:    Medicare IM give by:    Date Additional Medicare IM Given:    Additional Medicare Important Message give by:     If discussed at [place] of Stay Meetings, dates discussed:    Additional Comments:  Bethena Roys, RN 05/31/2015, 10:21 AM

## 2015-05-31 NOTE — Discharge Summary (Addendum)
Physician Discharge Summary  [name] UGQ:[id number] DOB: 11/06/1955 DOA: 05/27/2015  PCP: Rita Chou, NP  Admit date: 05/27/2015 Discharge date: 05/31/2015  Time spent: 35 minutes  Recommendations for Outpatient Follow-up:  1. Home health 2. TED hose 3. Close outpatient follow up with cardiology 4. Wean down chronic pain meds  Discharge Diagnoses:  Principal Problem:   Acute encephalopathy Active Problems:   Chronic pain syndrome   Paroxysmal atrial fibrillation   Diabetes type 2, uncontrolled   Symptomatic bradycardia   Elevated troponin   Chest pain   Discharge Condition: improved  Diet recommendation: carb mod  Filed Weights   05/27/15 2300  Weight: 59.557 kg (131 lb 4.8 oz)    History of present illness:  [name] is a 59 y.o. female with history of paroxysmal atrial fibrillation, hypertension, hyperlipidemia, diabetes mellitus type 2, was brought to the ER after patient's family noticed patient beginning in increasingly lethargic and frequent falls. Patient was found to be bradycardic with heart rate in the 40s, sinus bradycardia. In the ER patient was complaining of some chest pain retrosternal pressure-like which is also partially reproducible on palpation. Patient states the chest pain has been on for last 3 days. Denies nausea vomiting or diarrhea. Patient has been admitted for further management of her mental status changes and chest pain. Patient also has chronic epigastric pain and during last admission was found to have nonobstructive intussusception. Patient was recently admitted for syncope most likely secondary to bradycardia and pain relief medications. During which 2-D echo done showed EF of 60-65% with grade 1 diastolic dysfunction. On my exam patient still has persistent chest pain and and is mildly lethargic but answers questions appropriately and follows commands  Hospital Course:  1. Elevated troponin-this is likely demand ischemia. Flat  trend.  does not appear to be acute coronary syndrome. She does have an appointment with cardiology in [place] on the 21st.  -outpatient to check a nuclear stress test, noninvasive evaluation to ensure that she does not have any high risk signs of ischemia.  2. Paroxysmal atrial fibrillation - started back on low dose amiodarone yesterday follow QT  3. Sinus bradycardia - resolved started back on low dose amiodarone and beta blocker per EP yesterday  4. Significant orthostatic Hypotension-drop of 60 mmHg. on Midodrine as outpatient. This seems to have been a chronic issue. Cortisol normal-no evidence currently of adrenal insufficiency. She does have subtle hyponatremia. Avoid antihypertensives. Salt liberalization. TED hose midodinre 10 tid ] 5.  Acute encephalopathy - resolved -suspect related to hypotension +/- multiple medications  Prolonged QtC -monitor -avoid prolonging agents- celexa d/c'd, zofran d/c'd  Abdominal pain -on xray, it appeared patient has large stool burden -resolved  Diabetes mellitus type 2 - will continue Januvia. Resume metformin.   Procedures:    Consultations:  cardiology  Discharge Exam: Filed Vitals:   05/31/15 0720  BP: 183/80  Pulse: 73  Temp: 98.8 F (37.1 C)  Resp: 18    General: awake, NAD-- ready to go home  Discharge Instructions   Discharge Instructions    Diet Carb Modified    Complete by:  As directed      Discharge instructions    Complete by:  As directed   Keep appointment with cardiology for tomm TED hose     Increase activity slowly    Complete by:  As directed           Current Discharge Medication List    START taking these  medications   Details  cefUROXime (CEFTIN) 500 MG tablet Take 1 tablet (500 mg total) by mouth 2 (two) times daily with a meal. Qty: 14 tablet, Refills: 0    lactulose (CHRONULAC) 10 GM/15ML solution Take 30 mLs (20 g total) by mouth 2 (two) times daily as needed for mild  constipation. Qty: 240 mL, Refills: 0      CONTINUE these medications which have CHANGED   Details  amiodarone (PACERONE) 200 MG tablet Take 1 tablet (200 mg total) by mouth daily. Qty: 30 tablet, Refills: 0    metoprolol succinate (TOPROL-XL) 25 MG 24 hr tablet Take 1 tablet (25 mg total) by mouth daily. Qty: 30 tablet, Refills: 0    midodrine (PROAMATINE) 10 MG tablet Take 1 tablet (10 mg total) by mouth 3 (three) times daily with meals. Qty: 90 tablet, Refills: 0    morphine (MS CONTIN) 60 MG 12 hr tablet Take 1 tablet (60 mg total) by mouth 2 (two) times daily. Qty: 20 tablet, Refills: 0      CONTINUE these medications which have NOT CHANGED   Details  apixaban (ELIQUIS) 5 MG TABS tablet Take 1 tablet (5 mg total) by mouth 2 (two) times daily. Qty: 60 tablet, Refills: 0    atorvastatin (LIPITOR) 40 MG tablet Take 1 tablet (40 mg total) by mouth daily at 6 PM. Qty: 30 tablet, Refills: 0    dicyclomine (BENTYL) 10 MG capsule Take 1 capsule by mouth 4 (four) times daily.    JANUVIA 100 MG tablet Take 100 mg by mouth daily.    lisinopril (PRINIVIL,ZESTRIL) 5 MG tablet Take 1 tablet by mouth daily.    metFORMIN (GLUCOPHAGE) 1000 MG tablet Take 1 tablet by mouth 2 (two) times daily.    pantoprazole (PROTONIX) 40 MG tablet Take 1 tablet (40 mg total) by mouth 2 (two) times daily. Qty: 60 tablet, Refills: 0    sucralfate (CARAFATE) 1 G tablet Take 1 tablet (1 g total) by mouth 4 (four) times daily. Qty: 30 tablet, Refills: 0      STOP taking these medications     citalopram (CELEXA) 20 MG tablet      cloNIDine (CATAPRES) 0.1 MG tablet      collagenase (SANTYL) ointment      diltiazem (CARDIZEM CD) 120 MG 24 hr capsule      diphenhydrAMINE (BENADRYL) 25 mg capsule      HYDROcodone-acetaminophen (NORCO/VICODIN) 5-325 MG per tablet      metoCLOPramide (REGLAN) 10 MG tablet      tiZANidine (ZANAFLEX) 4 MG tablet      zolpidem (AMBIEN) 5 MG tablet      loperamide  (IMODIUM A-D) 2 MG tablet      ondansetron (ZOFRAN ODT) 8 MG disintegrating tablet        Allergies  Allergen Reactions  . Erythromycin Base Other (See Comments)    Reaction:  Fever   . Rosiglitazone Maleate Swelling  . Codeine Sulfate Rash  . Tetanus-Diphtheria Toxoids Td Rash and Other (See Comments)    Reaction:  Fever    Follow-up Information    Follow up with Rita Chou, NP In 1 week.   Specialty:  Nurse Practitioner   Contact information:   [facility] [place] [address] [phone]       Please follow up.   Why:  keep appointment with cardiology tomm       The results of significant diagnostics from this hospitalization (  including imaging, microbiology, ancillary and laboratory) are listed below for reference.    Significant Diagnostic Studies: Dg Chest 2 View  05/18/2015   CLINICAL DATA:  Epigastric pain, nausea and vomiting for 3 days.  EXAM: CHEST  2 VIEW  COMPARISON:  PA and lateral chest 05/17/2015.  FINDINGS: The lungs are clear. Heart size is upper normal. No pneumothorax or pleural effusion. Scoliosis noted  IMPRESSION: No acute disease.   Electronically Signed   By: Inge Rise M.D.   On: 05/18/2015 21:14   Dg Chest 2 View  05/17/2015   CLINICAL DATA:  Epigastric chest pain nausea and vomiting for 3 days  EXAM: CHEST  2 VIEW  COMPARISON:  04/05/2015  FINDINGS: Scoliosis thoracolumbar spine. Heart size and vascular pattern normal. Lungs clear. No pleural effusions.  IMPRESSION: No acute finding   Electronically Signed   By: Skipper Cliche M.D.   On: 05/17/2015 18:16   Dg Abd 1 View  05/19/2015   CLINICAL DATA:  59 year old female with abdominal pain and diarrhea. Inflamed small bowel on recent CT Abdomen and Pelvis. Initial encounter.  EXAM: ABDOMEN - 1 VIEW  COMPARISON:  CT Abdomen and Pelvis 05/17/2015, and earlier  FINDINGS: Portable AP supine view at 0749 hours. Oral contrast administered on 05/17/2015 has  reached the splenic flexure. Non obstructed bowel gas pattern. Abdominal and pelvic visceral contours are stable and within normal limits. Stable visualized osseous structures. No definite pneumoperitoneum on this supine view.  IMPRESSION: Normal bowel gas pattern. Oral contrast has reached the splenic flexure.   Electronically Signed   By: Genevie Ann M.D.   On: 05/19/2015 07:57   Ct Head Wo Contrast  05/27/2015   CLINICAL DATA:  59 year old female with fall  EXAM: CT HEAD WITHOUT CONTRAST  CT CERVICAL SPINE WITHOUT CONTRAST  TECHNIQUE: Multidetector CT imaging of the head and cervical spine was performed following the standard protocol without intravenous contrast. Multiplanar CT image reconstructions of the cervical spine were also generated.  COMPARISON:  None.  FINDINGS: CT HEAD FINDINGS  The ventricles and sulci are appropriate in size for patient's age. Mild periventricular and deep white matter hypodensities represent chronic microvascular ischemic changes. There is no intracranial hemorrhage. No mass effect or midline shift identified.  The visualized paranasal sinuses and mastoid air cells are well aerated. The calvarium is intact.  CT CERVICAL SPINE FINDINGS  There is no acute fracture or subluxation of the cervical spine.Mild multilevel degenerative changes.The odontoid and spinous processes are intact.There is normal anatomic alignment of the C1-C2 lateral masses. The visualized soft tissues appear unremarkable.  IMPRESSION: No acute intracranial hemorrhage.  Mild chronic microvascular ischemic changes.  No acute/traumatic cervical spine pathology.   Electronically Signed   By: Anner Crete M.D.   On: 05/27/2015 18:23   Ct Head Wo Contrast  05/18/2015   CLINICAL DATA:  59 year old female with fall and headache  EXAM: CT HEAD WITHOUT CONTRAST  TECHNIQUE: Contiguous axial images were obtained from the base of the skull through the vertex without intravenous contrast.  COMPARISON:  CT dated 04/13/2015   FINDINGS: The ventricles and sulci are appropriate in size for the patient's age. There is no intracranial hemorrhage. No mass effect or midline shift identified. The gray-white matter differentiation is preserved. There is no extra-axial fluid collection.  The visualized paranasal sinuses and mastoid air cells are well aerated. The calvarium is intact.  IMPRESSION: No acute intracranial pathology.   Electronically Signed   By: Laren Everts.D.  On: 05/18/2015 22:05   Ct Cervical Spine Wo Contrast  05/27/2015   CLINICAL DATA:  59 year old female with fall  EXAM: CT HEAD WITHOUT CONTRAST  CT CERVICAL SPINE WITHOUT CONTRAST  TECHNIQUE: Multidetector CT imaging of the head and cervical spine was performed following the standard protocol without intravenous contrast. Multiplanar CT image reconstructions of the cervical spine were also generated.  COMPARISON:  None.  FINDINGS: CT HEAD FINDINGS  The ventricles and sulci are appropriate in size for patient's age. Mild periventricular and deep white matter hypodensities represent chronic microvascular ischemic changes. There is no intracranial hemorrhage. No mass effect or midline shift identified.  The visualized paranasal sinuses and mastoid air cells are well aerated. The calvarium is intact.  CT CERVICAL SPINE FINDINGS  There is no acute fracture or subluxation of the cervical spine.Mild multilevel degenerative changes.The odontoid and spinous processes are intact.There is normal anatomic alignment of the C1-C2 lateral masses. The visualized soft tissues appear unremarkable.  IMPRESSION: No acute intracranial hemorrhage.  Mild chronic microvascular ischemic changes.  No acute/traumatic cervical spine pathology.   Electronically Signed   By: Anner Crete M.D.   On: 05/27/2015 18:23   Mr Brain Wo Contrast  05/20/2015   CLINICAL DATA:  Syncopal episode, lost consciousness per 2-3 minutes while walking in house. Urinary incontinence. Recent diagnosis of  atrial fibrillation. History of diabetes, cancer, hypertension.  EXAM: MRI HEAD WITHOUT CONTRAST  TECHNIQUE: Multiplanar, multiecho pulse sequences of the brain and surrounding structures were obtained without intravenous contrast.  COMPARISON:  CT head May 18, 2015 and MRI of the brain February 16, 2015  FINDINGS: The ventricles and sulci are normal for patient's age. No suspicious parenchymal signal, mass lesions, mass effect. No reduced diffusion to suggest acute ischemia. No susceptibility artifact to suggest hemorrhage. Scattered subcentimeter supratentorial white matter T2 hyperintensities.  No abnormal extra-axial fluid collections. No extra-axial masses though, contrast enhanced sequences would be more sensitive. Normal major intracranial vascular flow voids seen at the skull base. Bilateral hippocampi demonstrate normal size, morphology and signal characteristics.  Ocular globes and orbital contents are unremarkable though not tailored for evaluation. No abnormal sellar expansion. Small bilateral mastoid effusions. Paranasal sinuses are well-aerated. No suspicious calvarial bone marrow signal. No abnormal sellar expansion. Craniocervical junction maintained. Patient is edentulous.  IMPRESSION: No acute intracranial process, specifically no acute ischemia.  Similar mild chronic small vessel ischemic disease.   Electronically Signed   By: Elon Alas M.D.   On: 05/20/2015 02:35   Ct Abdomen Pelvis W Contrast  05/17/2015   CLINICAL DATA:  Pt c/o generalized abd pain with diarrhea x3 days  EXAM: CT ABDOMEN AND PELVIS WITH CONTRAST  TECHNIQUE: Multidetector CT imaging of the abdomen and pelvis was performed using the standard protocol following bolus administration of intravenous contrast.  CONTRAST:  136m OMNIPAQUE IOHEXOL 300 MG/ML  SOLN  COMPARISON:  04/03/2015  FINDINGS: Lower chest:  Clear  Hepatobiliary: Gallbladder surgically absent  Pancreas: Normal  Spleen: Normal  Adrenals/Urinary Tract: Mild  dilatation of both ureters with no ureteral stones, likely due to mild bladder distention. 760mobstructing stone or pole right kidney  Stomach/Bowel: Proximal jejunum show significant wall thickening involving several loops. She mild intussusception identified involving left upper quadrant jejunum. Mild dilatation both proximal and distal to this. Contrast seen well beyond this area into more distal small bowel.  Vascular/Lymphatic: No acute findings. Mild calcification aortoiliac vessels.  Reproductive: Not identified  Other: Small volume ascites  Musculoskeletal: No acute musculoskeletal findings  IMPRESSION: Significant inflammation involving multiple loops of proximal jejunum with mild nonobstructing intussusception   Electronically Signed   By: Skipper Cliche M.D.   On: 05/17/2015 19:50   Dg Chest Port 1 View  05/28/2015   CLINICAL DATA:  Abdominal and chest pain today.  EXAM: PORTABLE ABDOMEN - 1 VIEW; PORTABLE CHEST - 1 VIEW  COMPARISON:  Abdominal radiograph 05/19/2015. Chest x-ray 05/27/2015  FINDINGS: One view chest:  The heart is enlarged but stable. There is a left ventricular configuration. Minimal streaky atelectasis but no infiltrates or effusions. The bony thorax is intact.  One-view abdomen: Moderate persistent contrast in the right and proximal transverse colon. The rest of the colon is decompressed. Could not exclude a stricture. No obvious mass on the recent CT scan.  IMPRESSION: 1. Stable cardiac enlargement.  No acute pulmonary findings. 2. Persistent stool and contrast distended right and proximal transverse colon with otherwise decompressed distal colon. Could not exclude a stricture. No obvious mass on the recent CT scan.   Electronically Signed   By: Marijo Sanes M.D.   On: 05/28/2015 09:30   Dg Chest Portable 1 View  05/27/2015   CLINICAL DATA:  Fall today  EXAM: PORTABLE CHEST - 1 VIEW  COMPARISON:  05/18/2015  FINDINGS: Cardiomediastinal silhouette is stable. No infiltrate or  pulmonary edema. Mild basilar atelectasis. There is no pneumothorax. No gross fractures are noted.  IMPRESSION: No active disease.   Electronically Signed   By: Lahoma Crocker M.D.   On: 05/27/2015 20:14   Dg Abd Portable 1v  05/28/2015   CLINICAL DATA:  Abdominal and chest pain today.  EXAM: PORTABLE ABDOMEN - 1 VIEW; PORTABLE CHEST - 1 VIEW  COMPARISON:  Abdominal radiograph 05/19/2015. Chest x-ray 05/27/2015  FINDINGS: One view chest:  The heart is enlarged but stable. There is a left ventricular configuration. Minimal streaky atelectasis but no infiltrates or effusions. The bony thorax is intact.  One-view abdomen: Moderate persistent contrast in the right and proximal transverse colon. The rest of the colon is decompressed. Could not exclude a stricture. No obvious mass on the recent CT scan.  IMPRESSION: 1. Stable cardiac enlargement.  No acute pulmonary findings. 2. Persistent stool and contrast distended right and proximal transverse colon with otherwise decompressed distal colon. Could not exclude a stricture. No obvious mass on the recent CT scan.   Electronically Signed   By: Marijo Sanes M.D.   On: 05/28/2015 09:30    Microbiology: Recent Results (from the past 240 hour(s))  Urine culture     Status: None   Collection Time: 05/27/15  7:36 PM  Result Value Ref Range Status   Specimen Description URINE, RANDOM  Final   Special Requests NONE  Final   Culture   Final    >=100,000 COLONIES/mL DIPHTHEROIDS(CORYNEBACTERIUM SPECIES)   Report Status 05/29/2015 FINAL  Final  MRSA PCR Screening     Status: None   Collection Time: 05/27/15 10:50 PM  Result Value Ref Range Status   MRSA by PCR NEGATIVE NEGATIVE Final    Comment:        The GeneXpert MRSA Assay (FDA approved for NASAL specimens only), is one component of a comprehensive MRSA colonization surveillance program. It is not intended to diagnose MRSA infection nor to guide or monitor treatment for MRSA infections.   C difficile  quick scan w PCR reflex     Status: Abnormal   Collection Time: 05/28/15  3:05 PM  Result Value Ref Range Status  C Diff antigen POSITIVE (A) NEGATIVE Final   C Diff toxin NEGATIVE NEGATIVE Final   C Diff interpretation   Final    C. difficile present, but toxin not detected. This indicates colonization. In most cases, this does not require treatment. If patient has signs and symptoms consistent with colitis, consider treatment.     Labs: Basic Metabolic Panel:  Recent Labs Lab 05/27/15 1657 05/27/15 2036 05/29/15 0305 05/30/15 0255 05/31/15 0433  NA 132*  --  140 135 139  K 4.6  --  3.2* 3.8 4.4  CL 97*  --  110 105 110  CO2 24  --  22 23 22   GLUCOSE 266*  --  141* 168* 181*  BUN 14  --  19 14 9   CREATININE 1.05*  --  1.61* 1.33* 0.95  CALCIUM 9.2  --  8.4* 8.5* 8.6*  MG  --  1.7  --   --   --    Liver Function Tests:  Recent Labs Lab 05/27/15 2004  AST 30  ALT 29  ALKPHOS 71  BILITOT 1.0  PROT 6.1*  ALBUMIN 3.2*   No results for input(s): LIPASE, AMYLASE in the last 168 hours.  Recent Labs Lab 05/27/15 2005  AMMONIA 17   CBC:  Recent Labs Lab 05/27/15 1657 05/29/15 0305 05/30/15 0255 05/31/15 0433  WBC 8.5 16.6* 11.9* 6.9  NEUTROABS 6.9  --   --   --   HGB 13.4 12.2 11.3* 11.4*  HCT 38.3 36.3 34.8* 34.5*  MCV 92.5 96.5 97.5 96.1  PLT 149* 156 180 142*   Cardiac Enzymes:  Recent Labs Lab 05/28/15 0032 05/28/15 0450 05/28/15 1635  TROPONINI 0.67* 0.53* 0.20*   BNP: BNP (last 3 results)  Recent Labs  01/10/15 2300  BNP 62.5    ProBNP (last 3 results) No results for input(s): PROBNP in the last 8760 hours.  CBG:  Recent Labs Lab 05/30/15 0801 05/30/15 1151 05/30/15 1657 05/30/15 2133 05/31/15 0718  GLUCAP 156* 166* 163* 196* 165*       Signed:  Tradarius Reinwald  [facility] 05/31/2015, 10:09 AM

## 2015-06-01 ENCOUNTER — Ambulatory Visit: Payer: Medicare HMO | Admitting: Physician Assistant

## 2015-06-02 ENCOUNTER — Emergency Department
Admission: EM | Admit: 2015-06-02 | Discharge: 2015-06-03 | Disposition: A | Discharge status: Home / Self Care | Payer: Commercial Managed Care - HMO | Attending: Emergency Medicine | Admitting: Emergency Medicine

## 2015-06-02 ENCOUNTER — Encounter: Payer: Self-pay | Admitting: *Deleted

## 2015-06-02 ENCOUNTER — Emergency Department: Payer: Commercial Managed Care - HMO

## 2015-06-02 DIAGNOSIS — Z792 Long term (current) use of antibiotics: Secondary | ICD-10-CM | POA: Insufficient documentation

## 2015-06-02 DIAGNOSIS — Z79899 Other long term (current) drug therapy: Secondary | ICD-10-CM | POA: Diagnosis not present

## 2015-06-02 DIAGNOSIS — Z7902 Long term (current) use of antithrombotics/antiplatelets: Secondary | ICD-10-CM | POA: Diagnosis not present

## 2015-06-02 DIAGNOSIS — R079 Chest pain, unspecified: Secondary | ICD-10-CM | POA: Diagnosis not present

## 2015-06-02 DIAGNOSIS — I10 Essential (primary) hypertension: Secondary | ICD-10-CM | POA: Diagnosis not present

## 2015-06-02 DIAGNOSIS — Z79891 Long term (current) use of opiate analgesic: Secondary | ICD-10-CM | POA: Insufficient documentation

## 2015-06-02 DIAGNOSIS — R0602 Shortness of breath: Secondary | ICD-10-CM | POA: Diagnosis not present

## 2015-06-02 DIAGNOSIS — E109 Type 1 diabetes mellitus without complications: Secondary | ICD-10-CM | POA: Diagnosis not present

## 2015-06-02 DIAGNOSIS — R002 Palpitations: Secondary | ICD-10-CM | POA: Insufficient documentation

## 2015-06-02 LAB — CBC
HCT: 28.9 % — ABNORMAL LOW (ref 35.0–47.0)
Hemoglobin: 9.7 g/dL — ABNORMAL LOW (ref 12.0–16.0)
MCH: 32.3 pg (ref 26.0–34.0)
MCHC: 33.4 g/dL (ref 32.0–36.0)
MCV: 96.7 fL (ref 80.0–100.0)
PLATELETS: 109 10*3/uL — AB (ref 150–440)
RBC: 2.99 MIL/uL — ABNORMAL LOW (ref 3.80–5.20)
RDW: 13.1 % (ref 11.5–14.5)
WBC: 3.1 10*3/uL — AB (ref 3.6–11.0)

## 2015-06-02 MED ORDER — NITROGLYCERIN 0.4 MG SL SUBL
0.4000 mg | SUBLINGUAL_TABLET | SUBLINGUAL | Status: DC | PRN
  Administered 2015-06-03: 0.4 mg via SUBLINGUAL
  Filled 2015-06-02: qty 1

## 2015-06-02 NOTE — ED Notes (Signed)
MD at bedside for eval.

## 2015-06-02 NOTE — ED Notes (Signed)
Pt reports her heart is racing away for 3 hours.  Pt has chest pain on the left side.  Pt also has sob.  No n/v/d.   Nonsmoker.  Pt discharged from [facility] 2 days ago for syncopal episode.  Pt alert. Speech clear.

## 2015-06-02 NOTE — ED Notes (Signed)
Patient transported to X-ray via stretcher 

## 2015-06-03 DIAGNOSIS — R002 Palpitations: Secondary | ICD-10-CM | POA: Diagnosis not present

## 2015-06-03 DIAGNOSIS — E109 Type 1 diabetes mellitus without complications: Secondary | ICD-10-CM | POA: Diagnosis not present

## 2015-06-03 DIAGNOSIS — Z7902 Long term (current) use of antithrombotics/antiplatelets: Secondary | ICD-10-CM | POA: Diagnosis not present

## 2015-06-03 DIAGNOSIS — I10 Essential (primary) hypertension: Secondary | ICD-10-CM | POA: Diagnosis not present

## 2015-06-03 DIAGNOSIS — Z792 Long term (current) use of antibiotics: Secondary | ICD-10-CM | POA: Diagnosis not present

## 2015-06-03 DIAGNOSIS — Z79891 Long term (current) use of opiate analgesic: Secondary | ICD-10-CM | POA: Diagnosis not present

## 2015-06-03 DIAGNOSIS — Z79899 Other long term (current) drug therapy: Secondary | ICD-10-CM | POA: Diagnosis not present

## 2015-06-03 LAB — BASIC METABOLIC PANEL
ANION GAP: 5 (ref 5–15)
BUN: 11 mg/dL (ref 6–20)
CALCIUM: 8.5 mg/dL — AB (ref 8.9–10.3)
CO2: 24 mmol/L (ref 22–32)
Chloride: 110 mmol/L (ref 101–111)
Creatinine, Ser: 0.7 mg/dL (ref 0.44–1.00)
GLUCOSE: 231 mg/dL — AB (ref 65–99)
POTASSIUM: 3.2 mmol/L — AB (ref 3.5–5.1)
SODIUM: 139 mmol/L (ref 135–145)

## 2015-06-03 LAB — TROPONIN I: Troponin I: <0.03 ng/mL (ref <0.031)

## 2015-06-03 LAB — PROTIME-INR
INR: 1.38
Prothrombin Time: 17.2 seconds — ABNORMAL HIGH (ref 11.4–15.0)

## 2015-06-03 NOTE — Discharge Instructions (Signed)
Palpitations A palpitation is the feeling that your heartbeat is irregular or is faster than normal. It may feel like your heart is fluttering or skipping a beat. Palpitations are usually not a serious problem. However, in some cases, you may need further medical evaluation. CAUSES  Palpitations can be caused by:  Smoking.  Caffeine or other stimulants, such as diet pills or energy drinks.  Alcohol.  Stress and anxiety.  Strenuous physical activity.  Fatigue.  Certain medicines.  Heart disease, especially if you have a history of irregular heart rhythms (arrhythmias), such as atrial fibrillation, atrial flutter, or supraventricular tachycardia.  An improperly working pacemaker or defibrillator. DIAGNOSIS  To find the cause of your palpitations, your health care provider will take your medical history and perform a physical exam. Your health care provider may also have you take a test called an ambulatory electrocardiogram (ECG). An ECG records your heartbeat patterns over a 24-hour period. You may also have other tests, such as:  Transthoracic echocardiogram (TTE). During echocardiography, sound waves are used to evaluate how blood flows through your heart.  Transesophageal echocardiogram (TEE).  Cardiac monitoring. This allows your health care provider to monitor your heart rate and rhythm in real time.  Holter monitor. This is a portable device that records your heartbeat and can help diagnose heart arrhythmias. It allows your health care provider to track your heart activity for several days, if needed.  Stress tests by exercise or by giving medicine that makes the heart beat faster. TREATMENT  Treatment of palpitations depends on the cause of your symptoms and can vary greatly. Most cases of palpitations do not require any treatment other than time, relaxation, and monitoring your symptoms. Other causes, such as atrial fibrillation, atrial flutter, or supraventricular  tachycardia, usually require further treatment. HOME CARE INSTRUCTIONS   Avoid:  Caffeinated coffee, tea, soft drinks, diet pills, and energy drinks.  Chocolate.  Alcohol.  Stop smoking if you smoke.  Reduce your stress and anxiety. Things that can help you relax include:  A method of controlling things in your body, such as your heartbeats, with your mind (biofeedback).  Yoga.  Meditation.  Physical activity such as swimming, jogging, or walking.  Get plenty of rest and sleep. SEEK MEDICAL CARE IF:   You continue to have a fast or irregular heartbeat beyond 24 hours.  Your palpitations occur more often. SEEK IMMEDIATE MEDICAL CARE IF:  You have chest pain or shortness of breath.  You have a severe headache.  You feel dizzy or you faint. MAKE SURE YOU:  Understand these instructions.  Will watch your condition.  Will get help right away if you are not doing well or get worse. Document Released: 08/24/2000 Document Revised: 09/01/2013 Document Reviewed: 10/26/2011 [facility] Patient Information 2015 [place], [place]. This information is not intended to replace advice given to you by your health care provider. Make sure you discuss any questions you have with your health care provider.

## 2015-06-03 NOTE — ED Provider Notes (Signed)
[facility] Shauni Henner Note  ____________________________________________  Time seen: 12:30AM  I have reviewed the triage vital signs and the nursing notes.   HISTORY  Chief Complaint Chest Pain     HPI [name] is a 59 y.o. female presents with "feels like my heart is racing". Patient denies any irregularity in her heartbeat except the fact that its "fast". dyspnea, no chest pain or dizziness. Patient admits to previous episodes of the same for which she has been seen at multiple different locations.   Past Medical History  Diagnosis Date  . Type 1 diabetes   . Hypothyroid   . Degenerative disk disease   . Stomach ulcer   . Diverticulitis   . Syncope 01/2015  . Anxiety   . GERD (gastroesophageal reflux disease)   . History of hiatal hernia   . Cancer     HX OF CANCER OF UTERUS   . TIA (transient ischemic attack) 02/2015  . Hypertension   . Pancreatitis   . PAF (paroxysmal atrial fibrillation) 03/2015    a. new onset 03/2015 in setting of intractable N/V; b. on Eliquis 5 mg bid; c. CHADSVASc 4 (DM, TIA x 2, female)  . Intussusception intestine 05/2015    Patient Active Problem List   Diagnosis Date Noted  . Chest pain   . Symptomatic bradycardia 05/27/2015  . Acute encephalopathy 05/27/2015  . Elevated troponin 05/27/2015  . Intussusception intestine   . Other specified hypothyroidism   . Abdominal pain, chronic, epigastric   . Intussusception   . Diabetes type 2, uncontrolled   . Chronic atrial fibrillation 05/19/2015  . Diabetes mellitus type 2, controlled 05/19/2015  . Diarrhea 05/19/2015  . Chronic anemia 05/19/2015  . Thrombocytopenia 05/19/2015  . ARF (acute renal failure) 05/19/2015  . Bradycardia 05/19/2015  . Prolonged QT interval   . Paroxysmal atrial fibrillation   . Other specified hypotension   . Hypomagnesemia   . Colitis   . Acute renal failure syndrome   . Narcotic abuse   . Type 2 diabetes  mellitus without complication   . Syncope 05/18/2015  . PAF (paroxysmal atrial fibrillation)   . Nausea and vomiting 04/07/2015  . Demand ischemia 04/06/2015  . Hypokalemia 04/06/2015  . Diabetes mellitus type I 04/06/2015  . Hyperlipidemia with target LDL less than 100 04/06/2015  . Atrial fibrillation, new onset 04/05/2015  . Protein-calorie malnutrition, severe 04/05/2015  . Constipation   . Epigastric pain   . Diabetes type 1, uncontrolled   . Abdominal pain 04/03/2015  . Syncope and collapse 03/11/2015  . CVA (cerebral infarction) 02/15/2015  . Nausea with vomiting 01/12/2015  . Dehydration 01/12/2015  . Essential hypertension 01/12/2015  . Orthostatic hypotension 01/12/2015  . Chronically on opiate therapy 01/12/2015  . Gastroparesis 01/12/2015  . Orthostatic syncope 01/11/2015  . DEPRESSION/ANXIETY 06/27/2007  . MYOFASCIAL PAIN SYNDROME 06/27/2007  . Chronic pain syndrome 03/28/2007  . DM2 (diabetes mellitus, type 2) 03/27/2007  . GERD 03/27/2007  . DIVERTICULOSIS, COLON 03/27/2007  . LUMBAR DISC DISPLACEMENT 03/27/2007  . PROTEINURIA 03/27/2007  . UTERINE CANCER, HX OF 03/27/2007    Past Surgical History  Procedure Laterality Date  . Hernia repair    . Abdominal hysterectomy    . Cholecystectomy    . Esophagogastroduodenoscopy N/A 04/04/2015    Procedure: ESOPHAGOGASTRODUODENOSCOPY (EGD);  Surgeon: Hulen Luster, MD;  Location: [facility] ENDOSCOPY;  Service: Endoscopy;  Laterality: N/A;    Current Outpatient Rx  Name  Route  Sig  Dispense  Refill  . amiodarone (PACERONE) 200 MG tablet   Oral   Take 1 tablet (200 mg total) by mouth daily.   30 tablet   0   . apixaban (ELIQUIS) 5 MG TABS tablet   Oral   Take 1 tablet (5 mg total) by mouth 2 (two) times daily.   60 tablet   0   . atorvastatin (LIPITOR) 40 MG tablet   Oral   Take 1 tablet (40 mg total) by mouth daily at 6 PM.   30 tablet   0   . cefUROXime (CEFTIN) 500 MG tablet   Oral   Take 1 tablet (500  mg total) by mouth 2 (two) times daily with a meal.   14 tablet   0   . dicyclomine (BENTYL) 10 MG capsule   Oral   Take 1 capsule by mouth 4 (four) times daily.         Marland Kitchen JANUVIA 100 MG tablet   Oral   Take 100 mg by mouth daily.           Dispense as written.   . lactulose (CHRONULAC) 10 GM/15ML solution   Oral   Take 30 mLs (20 g total) by mouth 2 (two) times daily as needed for mild constipation.   240 mL   0   . lisinopril (PRINIVIL,ZESTRIL) 5 MG tablet   Oral   Take 1 tablet by mouth daily.         . metFORMIN (GLUCOPHAGE) 1000 MG tablet   Oral   Take 1 tablet by mouth 2 (two) times daily.         . metoprolol succinate (TOPROL-XL) 25 MG 24 hr tablet   Oral   Take 1 tablet (25 mg total) by mouth daily.   30 tablet   0   . midodrine (PROAMATINE) 10 MG tablet   Oral   Take 1 tablet (10 mg total) by mouth 3 (three) times daily with meals.   90 tablet   0   . morphine (MS CONTIN) 60 MG 12 hr tablet   Oral   Take 1 tablet (60 mg total) by mouth 2 (two) times daily.   20 tablet   0   . pantoprazole (PROTONIX) 40 MG tablet   Oral   Take 1 tablet (40 mg total) by mouth 2 (two) times daily.   60 tablet   0   . sucralfate (CARAFATE) 1 G tablet   Oral   Take 1 tablet (1 g total) by mouth 4 (four) times daily.   30 tablet   0     Allergies Erythromycin base; Rosiglitazone maleate; Codeine sulfate; and Tetanus-diphtheria toxoids td  Family History  Problem Relation Age of Onset  . Hypertension Mother   . CAD Sister     Social History Social History  Substance Use Topics  . Smoking status: Never Smoker   . Smokeless tobacco: Never Used  . Alcohol Use: No    Review of Systems  Constitutional: Negative for fever. Eyes: Negative for visual changes. ENT: Negative for sore throat. Cardiovascular: Negative for chest pain.Positive for palpitations Respiratory: Negative for shortness of breath. Gastrointestinal: Negative for abdominal pain,  vomiting and diarrhea. Genitourinary: Negative for dysuria. Musculoskeletal: Negative for back pain. Skin: Negative for rash. Neurological: Negative for headaches, focal weakness or numbness.   10-point ROS otherwise negative.  ____________________________________________   PHYSICAL EXAM:  VITAL SIGNS: ED Triage Vitals  Enc Vitals Group  BP 06/02/15 2245 124/67 mmHg     Pulse Rate 06/02/15 2245 66     Resp 06/02/15 2245 20     Temp 06/02/15 2245 98.8 F (37.1 C)     Temp Source 06/02/15 2245 Oral     SpO2 06/02/15 2245 97 %     Weight 06/02/15 2245 137 lb (62.143 kg)     Height 06/02/15 2245 5' 3"  (1.6 m)     Head Cir --      Peak Flow --      Pain Score 06/02/15 2328 10     Pain Loc --      Pain Edu? --      Excl. in [place]? --      Constitutional: Alert and oriented. Well appearing and in no distress. Eyes: Conjunctivae are normal. PERRL. Normal extraocular movements. ENT   Head: Normocephalic and atraumatic.   Nose: No congestion/rhinnorhea.   Mouth/Throat: Mucous membranes are moist.   Neck: No stridor. Hematological/Lymphatic/Immunilogical: No cervical lymphadenopathy. Cardiovascular: Normal rate, regular rhythm. Normal and symmetric distal pulses are present in all extremities. No murmurs, rubs, or gallops. Respiratory: Normal respiratory effort without tachypnea nor retractions. Breath sounds are clear and equal bilaterally. No wheezes/rales/rhonchi. Gastrointestinal: Soft and nontender. No distention. There is no CVA tenderness. Genitourinary: deferred Musculoskeletal: Nontender with normal range of motion in all extremities. No joint effusions.  No lower extremity tenderness nor edema. Neurologic:  Normal speech and language. No gross focal neurologic deficits are appreciated. Speech is normal.  Skin:  Skin is warm, dry and intact. No rash noted. Psychiatric: Mood and affect are normal. Speech and behavior are normal. Patient exhibits appropriate  insight and judgment.  ____________________________________________    LABS (pertinent positives/negatives)  Labs Reviewed  BASIC METABOLIC PANEL - Abnormal; Notable for the following:    Potassium 3.2 (*)    Glucose, Bld 231 (*)    Calcium 8.5 (*)    All other components within normal limits  CBC - Abnormal; Notable for the following:    WBC 3.1 (*)    RBC 2.99 (*)    Hemoglobin 9.7 (*)    HCT 28.9 (*)    Platelets 109 (*)    All other components within normal limits  PROTIME-INR - Abnormal; Notable for the following:    Prothrombin Time 17.2 (*)    All other components within normal limits  TROPONIN I  TROPONIN I     ____________________________________________   EKG  ED ECG REPORT I, BROWN, [place] N, the attending physician, personally viewed and interpreted this ECG.   Date: 06/03/2015  EKG Time: 10:42 PM  Rate: 59  Rhythm: Sinus bradycardia  Axis: None  Intervals: Normal  ST&T Change: None   ____________________________________________    RADIOLOGY DG Chest 2 View (Final result) Result time: 06/03/15 00:22:51   Final result by Rad Results In Interface (06/03/15 00:22:51)   Narrative:   CLINICAL DATA: Heart racing for 3 hours, LEFT side chest pain, shortness of breath, recently discharged for a syncopal episode 2 days ago, history atrial fibrillation, type I diabetes mellitus, GERD, hypertension, pancreatitis, atrial fibrillation  EXAM: CHEST 2 VIEW  COMPARISON: 05/28/2015  FINDINGS: Upper normal heart size.  Normal mediastinal contours and pulmonary vascularity.  Atherosclerotic calcification aorta.  Lungs clear.  No pleural effusion or pneumothorax.  Bones demineralized.  IMPRESSION: No acute abnormalities.   Electronically Signed By: Lavonia Dana M.D. On: 06/03/2015 00:22      INITIAL IMPRESSION / ASSESSMENT AND PLAN / ED  COURSE  Pertinent labs & imaging results that were available during my care of the patient  were reviewed by me and considered in my medical decision making (see chart for details).  Patient sinus bradycardia during entire emergency Department stay with heart rates of high 50s. Cardiac enzymes negative times to get  ____________________________________________   FINAL CLINICAL IMPRESSION(S) / ED DIAGNOSES  Final diagnoses:  Palpitations      Gregor Hams, MD 06/09/15 [phone]

## 2015-06-07 ENCOUNTER — Encounter: Payer: Self-pay | Admitting: Emergency Medicine

## 2015-06-07 ENCOUNTER — Emergency Department
Admission: EM | Admit: 2015-06-07 | Discharge: 2015-06-07 | Disposition: A | Discharge status: Home / Self Care | Payer: Commercial Managed Care - HMO | Attending: Emergency Medicine | Admitting: Emergency Medicine

## 2015-06-07 DIAGNOSIS — H53149 Visual discomfort, unspecified: Secondary | ICD-10-CM | POA: Insufficient documentation

## 2015-06-07 DIAGNOSIS — H538 Other visual disturbances: Secondary | ICD-10-CM | POA: Insufficient documentation

## 2015-06-07 DIAGNOSIS — R21 Rash and other nonspecific skin eruption: Secondary | ICD-10-CM | POA: Diagnosis present

## 2015-06-07 DIAGNOSIS — Z79899 Other long term (current) drug therapy: Secondary | ICD-10-CM | POA: Insufficient documentation

## 2015-06-07 DIAGNOSIS — I10 Essential (primary) hypertension: Secondary | ICD-10-CM | POA: Diagnosis not present

## 2015-06-07 DIAGNOSIS — E109 Type 1 diabetes mellitus without complications: Secondary | ICD-10-CM | POA: Diagnosis not present

## 2015-06-07 DIAGNOSIS — B029 Zoster without complications: Secondary | ICD-10-CM | POA: Insufficient documentation

## 2015-06-07 MED ORDER — FLUORESCEIN SODIUM 1 MG OP STRP
1.0000 | ORAL_STRIP | Freq: Once | OPHTHALMIC | Status: DC
  Filled 2015-06-07: qty 1

## 2015-06-07 MED ORDER — VALACYCLOVIR HCL 1 G PO TABS: 1000.0000 mg | ORAL_TABLET | Freq: Three times a day (TID) | ORAL | Status: AC

## 2015-06-07 MED ORDER — TETRACAINE HCL 0.5 % OP SOLN
1.0000 [drp] | Freq: Once | OPHTHALMIC | Status: DC
  Filled 2015-06-07: qty 2

## 2015-06-07 NOTE — ED Provider Notes (Signed)
CSN: [id number]     Arrival date & time 06/07/15  1153 History   First MD Initiated Contact with Patient 06/07/15 1212     Chief Complaint  Patient presents with  . Rash     HPI Comments: 59 year old female presents today complaining of right sided facial rash and eye pain for the past 3 days. Pt states that she has had drainage from the eye and it hurts to open it. She does wear glasses but they are out of date. The rash is very painful.   Patient is a 59 y.o. female presenting with eye problem. The history is provided by the patient.  Eye Problem Location:  R eye Quality:  Aching Severity:  Moderate Onset quality:  Gradual Duration:  3 days Timing:  Constant Progression:  Worsening Chronicity:  New Relieved by:  None tried Worsened by:  Bright light Ineffective treatments:  Closing eye and darkened room Associated symptoms: blurred vision, decreased vision, discharge, facial rash, photophobia, redness, swelling and tearing   Associated symptoms: no numbness     Past Medical History  Diagnosis Date  . Type 1 diabetes   . Hypothyroid   . Degenerative disk disease   . Stomach ulcer   . Diverticulitis   . Syncope 01/2015  . Anxiety   . GERD (gastroesophageal reflux disease)   . History of hiatal hernia   . Cancer     HX OF CANCER OF UTERUS   . TIA (transient ischemic attack) 02/2015  . Hypertension   . Pancreatitis   . PAF (paroxysmal atrial fibrillation) 03/2015    a. new onset 03/2015 in setting of intractable N/V; b. on Eliquis 5 mg bid; c. CHADSVASc 4 (DM, TIA x 2, female)  . Intussusception intestine 05/2015   Past Surgical History  Procedure Laterality Date  . Hernia repair    . Abdominal hysterectomy    . Cholecystectomy    . Esophagogastroduodenoscopy N/A 04/04/2015    Procedure: ESOPHAGOGASTRODUODENOSCOPY (EGD);  Surgeon: Hulen Luster, MD;  Location[facility] ENDOSCOPY;  Service: Endoscopy;  Laterality: N/A;   Family History  Problem Relation Age of Onset  .  Hypertension Mother   . CAD Sister    Social History  Substance Use Topics  . Smoking status: Never Smoker   . Smokeless tobacco: Never Used  . Alcohol Use: No   OB History    No data available     Review of Systems  Eyes: Positive for blurred vision, photophobia, pain, discharge, redness and visual disturbance.  Skin: Positive for rash.  Neurological: Negative for numbness.  All other systems reviewed and are negative.     Allergies  Erythromycin base; Rosiglitazone maleate; Codeine sulfate; and Tetanus-diphtheria toxoids td  Home Medications   Prior to Admission medications   Medication Sig Start Date End Date Taking? Authorizing Provider  amiodarone (PACERONE) 200 MG tablet Take 1 tablet (200 mg total) by mouth daily. 05/31/15   Geradine Girt, DO  apixaban (ELIQUIS) 5 MG TABS tablet Take 1 tablet (5 mg total) by mouth 2 (two) times daily. 04/07/15   Bettey Costa, MD  atorvastatin (LIPITOR) 40 MG tablet Take 1 tablet (40 mg total) by mouth daily at 6 PM. 02/17/15   Bettey Costa, MD  cefUROXime (CEFTIN) 500 MG tablet Take 1 tablet (500 mg total) by mouth 2 (two) times daily with a meal. 05/31/15   Geradine Girt, DO  dicyclomine (BENTYL) 10 MG capsule Take 1 capsule by mouth 4 (four)  times daily. 03/11/15   Historical Provider, MD  JANUVIA 100 MG tablet Take 100 mg by mouth daily. 12/15/14   Historical Provider, MD  lactulose (CHRONULAC) 10 GM/15ML solution Take 30 mLs (20 g total) by mouth 2 (two) times daily as needed for mild constipation. 05/31/15   Geradine Girt, DO  lisinopril (PRINIVIL,ZESTRIL) 5 MG tablet Take 1 tablet by mouth daily. 03/15/15   Historical Provider, MD  metFORMIN (GLUCOPHAGE) 1000 MG tablet Take 1 tablet by mouth 2 (two) times daily. 03/11/15   Historical Provider, MD  metoprolol succinate (TOPROL-XL) 25 MG 24 hr tablet Take 1 tablet (25 mg total) by mouth daily. 05/31/15   Geradine Girt, DO  midodrine (PROAMATINE) 10 MG tablet Take 1 tablet (10 mg total) by mouth 3  (three) times daily with meals. 05/31/15   Geradine Girt, DO  morphine (MS CONTIN) 60 MG 12 hr tablet Take 1 tablet (60 mg total) by mouth 2 (two) times daily. 05/31/15   Geradine Girt, DO  pantoprazole (PROTONIX) 40 MG tablet Take 1 tablet (40 mg total) by mouth 2 (two) times daily. 04/07/15   Bettey Costa, MD  sucralfate (CARAFATE) 1 G tablet Take 1 tablet (1 g total) by mouth 4 (four) times daily. 04/13/15 04/12/16  Loney Hering, MD  valACYclovir (VALTREX) 1000 MG tablet Take 1 tablet (1,000 mg total) by mouth 3 (three) times daily. 06/07/15 06/14/15  Shayne Alken V, PA-C   BP 160/80 mmHg  Pulse 62  Temp(Src) 98 F (36.7 C) (Oral)  Resp 18  Ht 5' 3"  (1.6 m)  Wt 137 lb (62.143 kg)  BMI 24.27 kg/m2  SpO2 98% Physical Exam  Constitutional: She is oriented to person, place, and time. Vital signs are normal. She appears well-developed and well-nourished. She appears ill.  HENT:  Head: Normocephalic and atraumatic.  Eyes: EOM are normal. Pupils are equal, round, and reactive to light. Lids are everted and swept, no foreign bodies found. Right conjunctiva is injected. Left conjunctiva is not injected.  Slit lamp exam:      The right eye shows no corneal abrasion, no corneal flare, no corneal ulcer and no fluorescein uptake.  +Photophobia right eye  Right conjunctiva injected Right eye examined with fluorescein after checking [facility] and staining eye with tetracaine. No dendritic lesions observed   Neurological: She is alert and oriented to person, place, and time.  Skin:  Vesicular erythematous rash to right forehead extending to right upper eyelid   Psychiatric: She has a normal mood and affect. Her behavior is normal. Judgment and thought content normal.  Nursing note and vitals reviewed.   ED Course  Procedures (including critical care time) Labs Review Labs Reviewed - No data to display  Imaging Review No results found. I have personally reviewed and evaluated these images and lab  results as part of my medical decision-making.   EKG Interpretation None      MDM  [facility]  Right eye 20/200 Left eye 20/40 Both eyes 20/50  Discussed with Dr. Charlann Boxer, ophthalmologist on call who advised pt could be seen in the office today. I advised pt she should go directly to the office. Valtrex 1g TID written even though it is >48 hours from onset given pt is immunocompromised. Her records indicate she is on MS contin for pain at home and she can continue this regimen Final diagnoses:  Shingles        Corliss Parish, PA-C 06/07/15 1336  Delman Kitten, MD  06/07/15 1542 

## 2015-06-07 NOTE — ED Notes (Signed)
Noticed a rash to forehead yesterday increased pain to forehead and right eye

## 2015-06-07 NOTE — ED Notes (Signed)
Developed a rash to right side of face about 2 days ago.

## 2015-06-07 NOTE — Discharge Instructions (Signed)
Shingles Shingles (herpes zoster) is an infection that is caused by the same virus that causes chickenpox (varicella). The infection causes a painful skin rash and fluid-filled blisters, which eventually break open, crust over, and heal. It may occur in any area of the body, but it usually affects only one side of the body or face. The pain of shingles usually lasts about 1 month. However, some people with shingles may develop long-term (chronic) pain in the affected area of the body. Shingles often occurs many years after the person had chickenpox. It is more common:  In people older than 50 years.  In people with weakened immune systems, such as those with HIV, AIDS, or cancer.  In people taking medicines that weaken the immune system, such as transplant medicines.  In people under great stress. CAUSES  Shingles is caused by the varicella zoster virus (VZV), which also causes chickenpox. After a person is infected with the virus, it can remain in the person's body for years in an inactive state (dormant). To cause shingles, the virus reactivates and breaks out as an infection in a nerve root. The virus can be spread from person to person (contagious) through contact with open blisters of the shingles rash. It will only spread to people who have not had chickenpox. When these people are exposed to the virus, they may develop chickenpox. They will not develop shingles. Once the blisters scab over, the person is no longer contagious and cannot spread the virus to others. SIGNS AND SYMPTOMS  Shingles shows up in stages. The initial symptoms may be pain, itching, and tingling in an area of the skin. This pain is usually described as burning, stabbing, or throbbing.In a few days or weeks, a painful red rash will appear in the area where the pain, itching, and tingling were felt. The rash is usually on one side of the body in a band or belt-like pattern. Then, the rash usually turns into fluid-filled  blisters. They will scab over and dry up in approximately 2-3 weeks. Flu-like symptoms may also occur with the initial symptoms, the rash, or the blisters. These may include:  Fever.  Chills.  Headache.  Upset stomach. DIAGNOSIS  Your health care provider will perform a skin exam to diagnose shingles. Skin scrapings or fluid samples may also be taken from the blisters. This sample will be examined under a microscope or sent to a lab for further testing. TREATMENT  There is no specific cure for shingles. Your health care provider will likely prescribe medicines to help you manage the pain, recover faster, and avoid long-term problems. This may include antiviral drugs, anti-inflammatory drugs, and pain medicines. HOME CARE INSTRUCTIONS   Take a cool bath or apply cool compresses to the area of the rash or blisters as directed. This may help with the pain and itching.   Take medicines only as directed by your health care provider.   Rest as directed by your health care provider.  Keep your rash and blisters clean with mild soap and cool water or as directed by your health care provider.  Do not pick your blisters or scratch your rash. Apply an anti-itch cream or numbing creams to the affected area as directed by your health care provider.  Keep your shingles rash covered with a loose bandage (dressing).  Avoid skin contact with:  Babies.   Pregnant women.   Children with eczema.   Elderly people with transplants.   People with chronic illnesses, such as leukemia   or AIDS.   Wear loose-fitting clothing to help ease the pain of material rubbing against the rash.  Keep all follow-up visits as directed by your health care provider.If the area involved is on your face, you may receive a referral for a specialist, such as an eye doctor (ophthalmologist) or an ear, nose, and throat (ENT) doctor. Keeping all follow-up visits will help you avoid eye problems, chronic pain, or  disability.  SEEK IMMEDIATE MEDICAL CARE IF:   You have facial pain, pain around the eye area, or loss of feeling on one side of your face.  You have ear pain or ringing in your ear.  You have loss of taste.  Your pain is not relieved with prescribed medicines.   Your redness or swelling spreads.   You have more pain and swelling.  Your condition is worsening or has changed.   You have a fever. MAKE SURE YOU:  Understand these instructions.  Will watch your condition.  Will get help right away if you are not doing well or get worse. Document Released: 08/27/2005 Document Revised: 01/11/2014 Document Reviewed: 04/10/2012 Exit[employer] Patient Information 2015 [employer]. This information is not intended to replace advice given to you by your health care provider. Make sure you discuss any questions you have with your health care provider.  

## 2015-06-09 IMAGING — CT CT ABD-PELV W/ CM
2 of 5 series · 17 of 46 positions shown, 19 images · IV contrast (isovue)
Comparison: CT of the abdomen and pelvis 09/29/2012.

CLINICAL DATA: Upper abdominal pain and vomiting.

EXAM:
CT ABDOMEN AND PELVIS WITH CONTRAST
TECHNIQUE: Multidetector CT imaging of the abdomen and pelvis was performed
using the standard protocol following bolus administration of
intravenous contrast.
CONTRAST:  125 mm of Isovue 370.

[Series 2: routine abd pel with · axial · 0.68mm/px · z∈[-470,-26]mm · 14 of 101 slices shown, 16 images]
[im 6/101  soft-tissue]
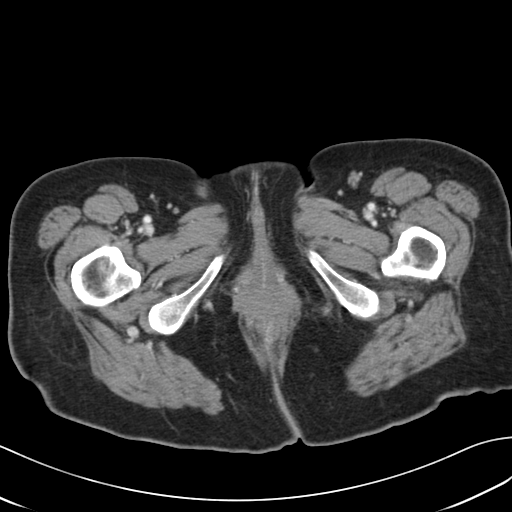
[im 6/101  bone]
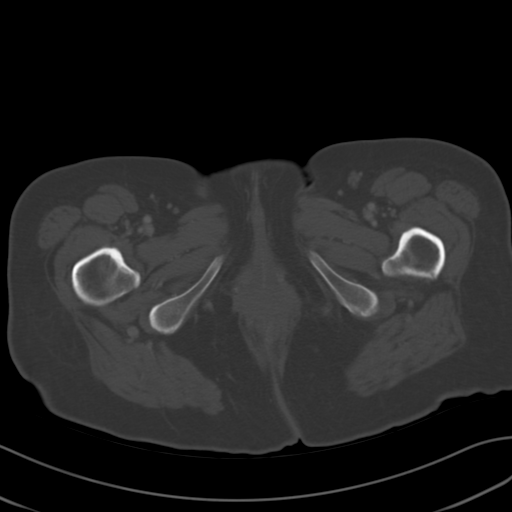
[im 12/101  soft-tissue]
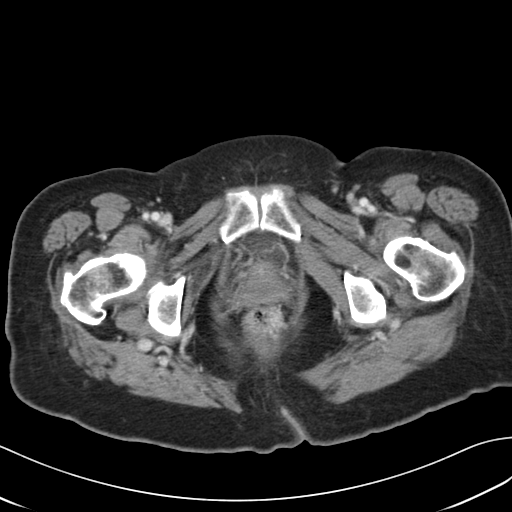
[im 18/101  soft-tissue]
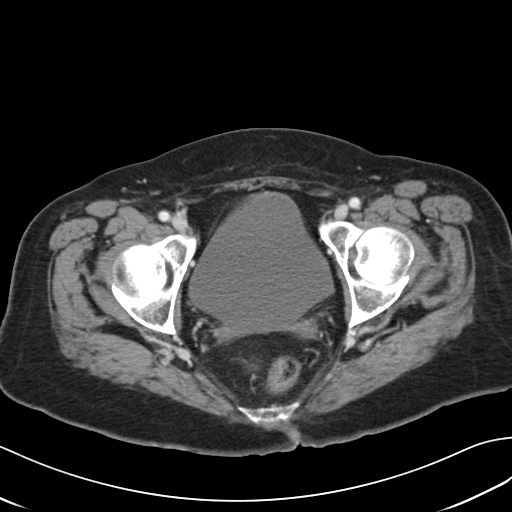
[im 30/101  soft-tissue]
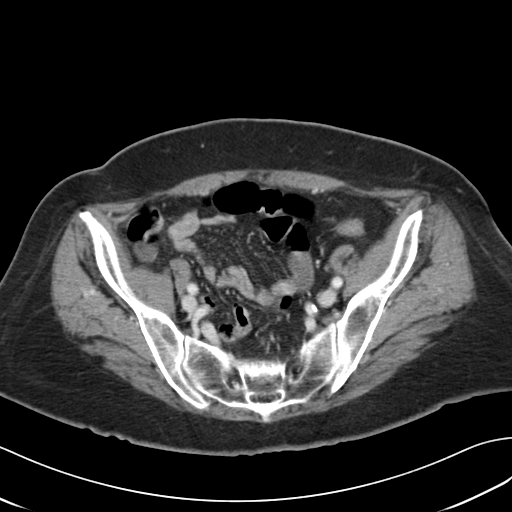
[im 36/101  soft-tissue]
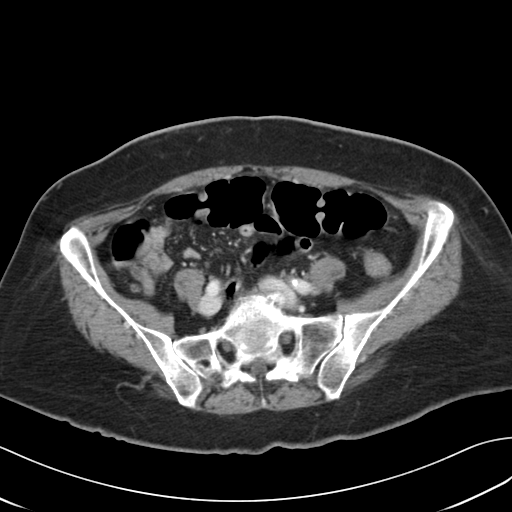
[im 42/101  soft-tissue]
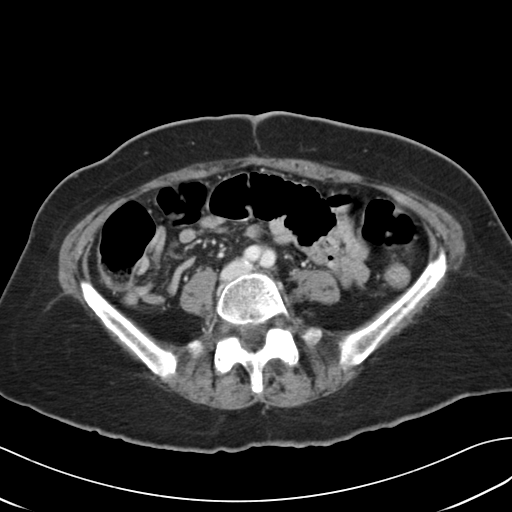
[im 48/101  soft-tissue]
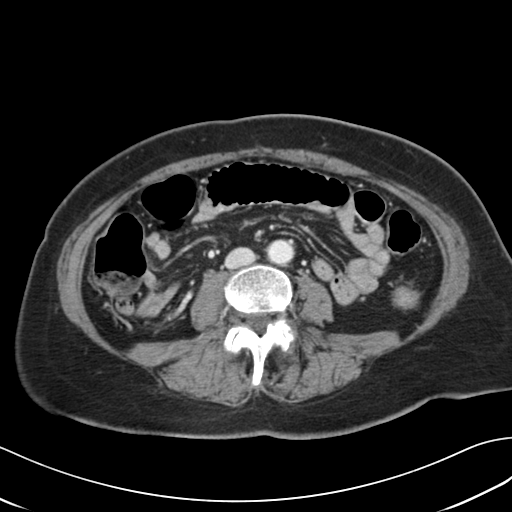
[im 53/101  soft-tissue]
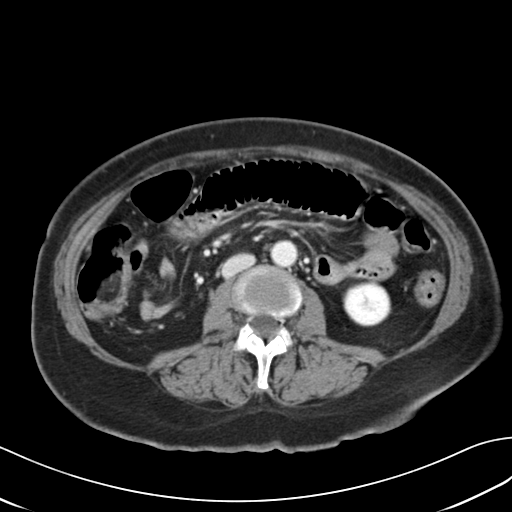
[im 59/101  soft-tissue]
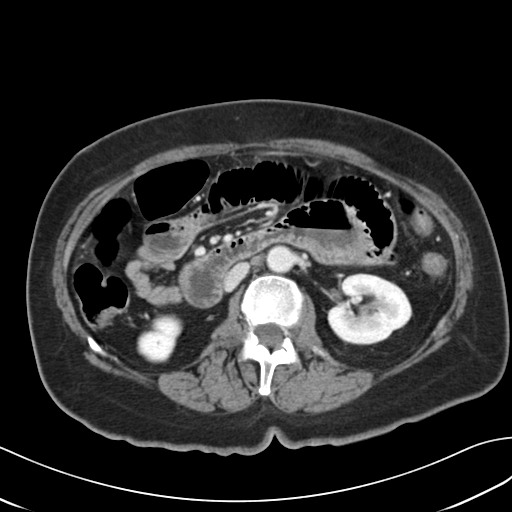
[im 59/101  bone]
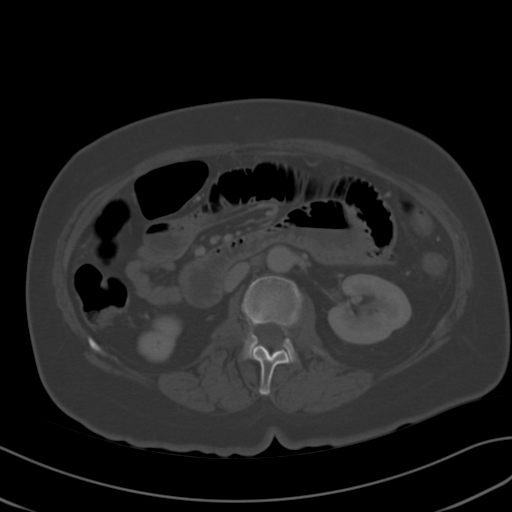
[im 65/101  soft-tissue]
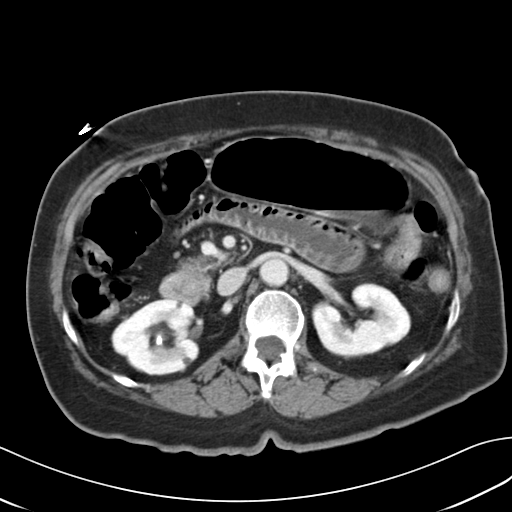
[im 77/101  soft-tissue]
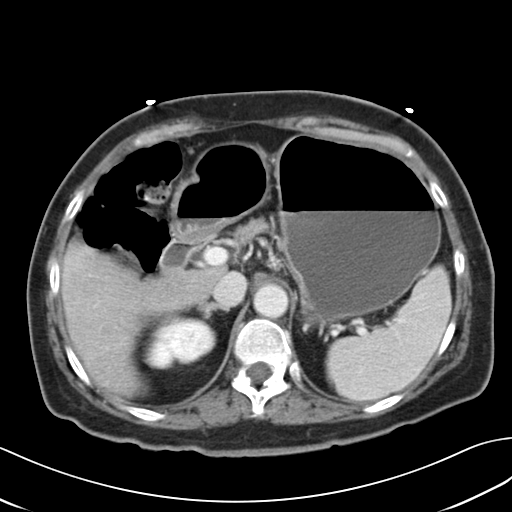
[im 83/101  soft-tissue]
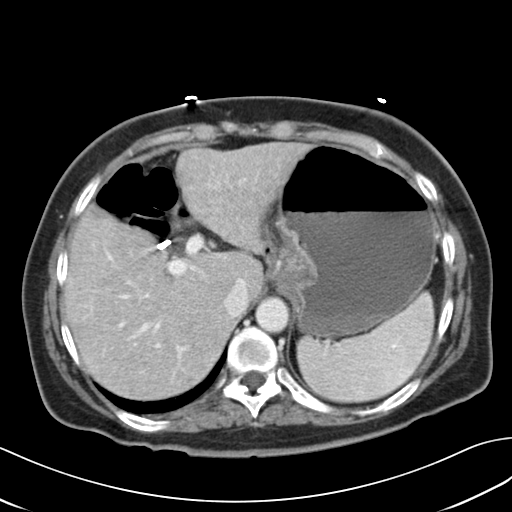
[im 89/101  soft-tissue]
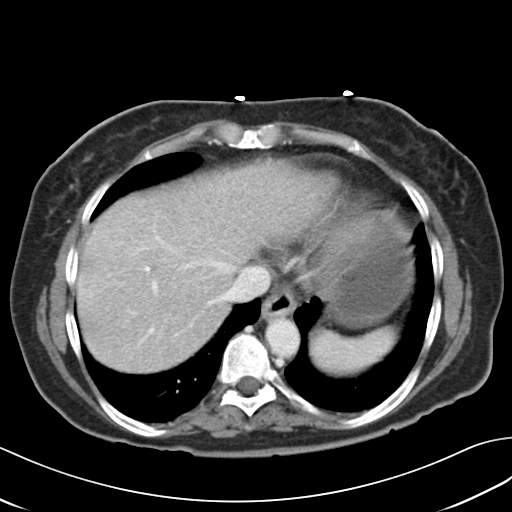
[im 95/101  soft-tissue]
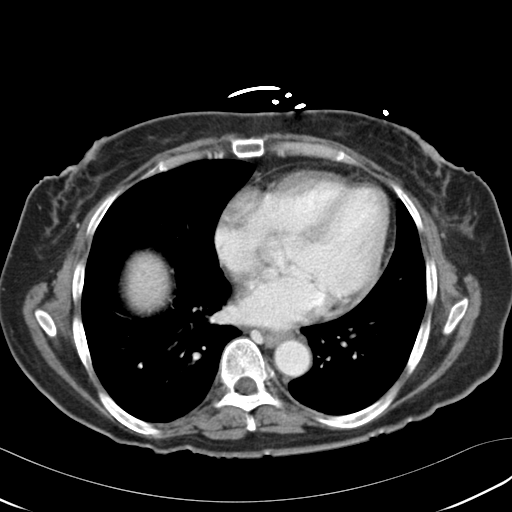

[Series 5: cor routine abd pel with · coronal · 0.98mm/px · 3 of 113 slices shown]
[im 38/113  soft-tissue]
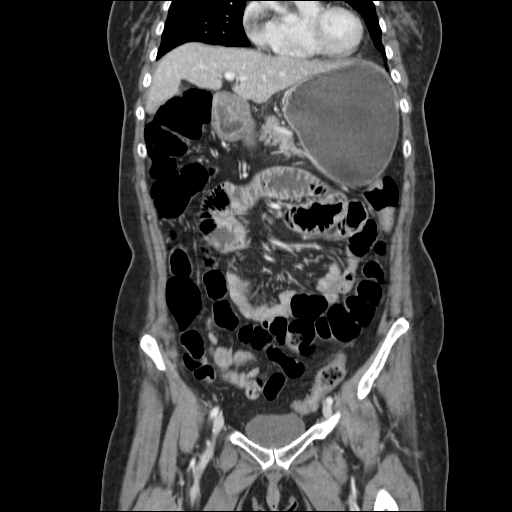
[im 50/113  soft-tissue]
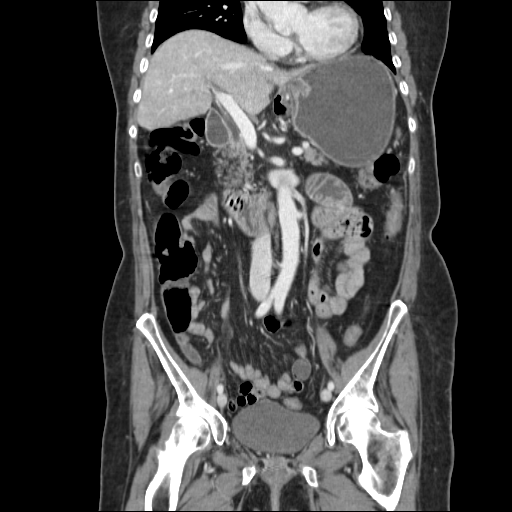
[im 63/113  soft-tissue]
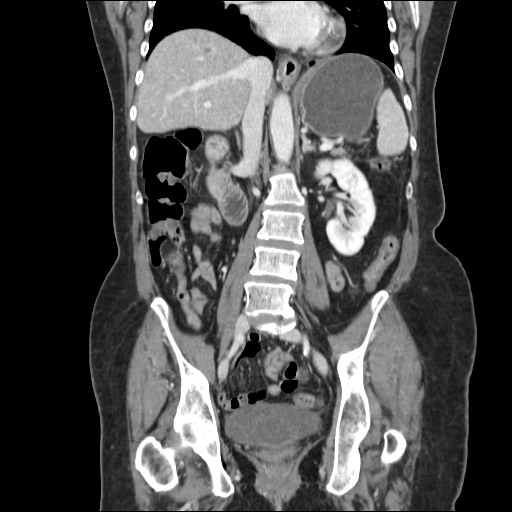

[17 of 46 positions shown; findings below may reference images not displayed]

FINDINGS: Lung Bases: Small hiatal hernia. Otherwise, unremarkable.

Abdomen/Pelvis: The liver has a shrunken appearance and nodular
contour, compatible with underlying cirrhosis. Status post
cholecystectomy. The appearance the pancreas, spleen and bilateral
adrenal is unremarkable. Multiple subcentimeter low-attenuation
right renal lesions are too small to definitively characterize, but
statistically likely to represent cysts 8 mm nonobstructive calculus
in the lower pole collecting system the right kidney. 3 mm
nonobstructive calculus in the lower pole collecting system of the
left kidney.

Normal appendix. No significant volume of ascites. No
pneumoperitoneum no pathologic distention of small bowel. No
definite lymphadenopathy identified within the abdomen or pelvis on
today's examination. Status post hysterectomy. Ovaries are not
confidently identified may be surgically absent or atrophic. Urinary
bladder is unremarkable in appearance.

Musculoskeletal: There are no aggressive appearing lytic or blastic
lesions noted in the visualized portions of the skeleton.
IMPRESSION: 1. No acute findings in the abdomen or pelvis to account for the
patient's symptoms.
2. The appearance of the liver is compatible with cirrhosis.
3. Small hiatal hernia.
4. Status post cholecystectomy and hysterectomy.
5. 8 mm and 3 mm nonobstructive calculi within the lower pole
collecting systems of the right and left kidneys respectively. No
ureteral stones or findings of tract obstruction at this time.

## 2015-06-14 ENCOUNTER — Encounter (INDEPENDENT_AMBULATORY_CARE_PROVIDER_SITE_OTHER): Payer: Self-pay

## 2015-06-14 ENCOUNTER — Telehealth: Payer: Self-pay | Admitting: Primary Care

## 2015-06-14 ENCOUNTER — Ambulatory Visit (INDEPENDENT_AMBULATORY_CARE_PROVIDER_SITE_OTHER): Payer: Commercial Managed Care - HMO | Admitting: Primary Care

## 2015-06-14 ENCOUNTER — Encounter: Payer: Self-pay | Admitting: Primary Care

## 2015-06-14 VITALS — BP 148/64 | HR 118 | Temp 98.5°F | Ht 63.0 in | Wt 133.8 lb

## 2015-06-14 DIAGNOSIS — I63311 Cerebral infarction due to thrombosis of right middle cerebral artery: Secondary | ICD-10-CM

## 2015-06-14 DIAGNOSIS — I10 Essential (primary) hypertension: Secondary | ICD-10-CM

## 2015-06-14 DIAGNOSIS — M549 Dorsalgia, unspecified: Secondary | ICD-10-CM

## 2015-06-14 DIAGNOSIS — E119 Type 2 diabetes mellitus without complications: Secondary | ICD-10-CM

## 2015-06-14 DIAGNOSIS — K219 Gastro-esophageal reflux disease without esophagitis: Secondary | ICD-10-CM

## 2015-06-14 DIAGNOSIS — E785 Hyperlipidemia, unspecified: Secondary | ICD-10-CM

## 2015-06-14 DIAGNOSIS — I480 Paroxysmal atrial fibrillation: Secondary | ICD-10-CM

## 2015-06-14 DIAGNOSIS — I951 Orthostatic hypotension: Secondary | ICD-10-CM

## 2015-06-14 DIAGNOSIS — G894 Chronic pain syndrome: Secondary | ICD-10-CM

## 2015-06-14 DIAGNOSIS — G8929 Other chronic pain: Secondary | ICD-10-CM

## 2015-06-14 DIAGNOSIS — E038 Other specified hypothyroidism: Secondary | ICD-10-CM

## 2015-06-14 MED ORDER — METOPROLOL SUCCINATE ER 25 MG PO TB24: 25.0000 mg | ORAL_TABLET | Freq: Every day | ORAL | Status: DC

## 2015-06-14 NOTE — Progress Notes (Signed)
Subjective:    Patient ID: [name], female    DOB: 16-Jan-1956, 59 y.o.   MRN: [id number]  HPI  [name] is a 59 year old female who presents today to establish care and discuss the problems mentioned below. Will obtain old records. She was once managed with Dr. Aris Lot with [facility]. She last saw her on August 3rd.  1) Type 2 Diabetes: Diagnosed 10 years ago. Currently taking Januvia 100 mg for the past several months, Levemir  She was managed on metformin 1000 mg twice daily but could not tolerate due to GI side effects. She checks her blood sugars twice daily and will get 230-300's. Denies low numbers. Last A1C was 7.1 on September 8th 2016.  2) Essential Hypertension: Diagnosed years ago. Currently managed on Clonidine 0.1 mg TID PRN, Lisinopril 5 mg, Toprol XL 100 mg. She does not currently check her blood pressure daily.  3) Parxysmosal Atrial Fibrillation: Currently managed on Coumadin (DVT), Eliquis, and amiodarone 200 mg, midodrine 10 mg. Coumadin and diltiazem CD 136m discontinued during hospitalization.  Denies chest pain, shortness of breath, dizziness.  4) Hypothyroidism: TSH in September 2016 was 4.609. She is currently not taking any medication. Denies cold intolerance, fatigue, swelling to her neck.  5) CVA: Diagnosed 8 months ago. She denies residual. She does not follow with a neurologist. Managed on atorvastatin 40 mg  6) Hyperlipidemia: Managed on atorvastatin 40 mg. Last lipid panel was September 2016 and was stable.  7) Hospital Follow-Up: Admitted on 9/16 and discharge home on 9/20 She presented to the emergency department via EMS after increased weakness and multiple falls at home. She was noted to be bradycardic with HR in the 40's. She was admitted due to bradycardia, chest pain, epigastric pain, and lethergy. She was recently discharged from the hospital for an episode of syncope. She was evaluated by cardiology and had follow up scheduled for 06/01/15.  She missed this appointment. She is on numerous medications which were switched and discontinued. She is very confused about her current medication regimen and is requiring help with organization. Recommendations for follow up after hospitalization include: Home health, cardiology, and wean off pain meds. She is awaiting contact from home health and plans to reschedule her cardiology appointment. Today she denies chest pain, dizziness, shortness of breath, weakness.  Review of Systems  Constitutional: Negative for unexpected weight change.  HENT: Negative for rhinorrhea.   Respiratory: Negative for cough and shortness of breath.   Cardiovascular: Negative for chest pain.  Gastrointestinal: Negative for diarrhea and constipation.       Diarrhea, intermittently.  Genitourinary: Negative for difficulty urinating.  Musculoskeletal: Positive for back pain.  Skin: Negative for rash.  Neurological: Negative for dizziness, numbness and headaches.  Psychiatric/Behavioral:       Denies concerns for anxiety or depression       Past Medical History  Diagnosis Date  . Type 1 diabetes ([facility]   . Hypothyroid   . Degenerative disk disease   . Stomach ulcer   . Diverticulitis   . Syncope 01/2015  . Anxiety   . GERD (gastroesophageal reflux disease)   . History of hiatal hernia   . Cancer (HCC)     HX OF CANCER OF UTERUS   . TIA (transient ischemic attack) 02/2015  . Hypertension   . Pancreatitis   . PA[place] ([place]oxysmal [place] fibrillation) [place] [place]    a. new onset 03/2015 in setting of intractable N/V; b. on Eliquis 5  mg bid; c. CHADSVASc 4 (DM, TIA x 2, female)  . Intussusception intestine ([place]) 05/2015    Social History   Social History  . Marital Status: Married    Spouse Name: N/A  . Number of Children: N/A  . Years of Education: N/A   Occupational History  . Disabled 2nd back problems    Social History Main Topics  . Smoking status: Never Smoker   . Smokeless tobacco: Never Used    . Alcohol Use: No  . Drug Use: No  . Sexual Activity: No   Other Topics Concern  . Not on file   Social History Narrative   Lives in [place], [place] with her husband and 2 sons.    Past Surgical History  Procedure Laterality Date  . Hernia repair    . Abdominal hysterectomy    . Cholecystectomy    . Esophagogastroduodenoscopy N/A 04/04/2015    Procedure: ESOPHAGOGASTRODUODENOSCOPY (EGD);  Surgeon: Hulen Luster, MD;  Location: [facility] ENDOSCOPY;  Service: Endoscopy;  Laterality: N/A;    Family History  Problem Relation Age of Onset  . Hypertension Mother   . CAD Sister     Allergies  Allergen Reactions  . Erythromycin Base Other (See Comments)    Reaction:  Fever   . Rosiglitazone Maleate Swelling  . Codeine Sulfate Rash  . Tetanus-Diphtheria Toxoids Td Rash and Other (See Comments)    Reaction:  Fever     Current Outpatient Prescriptions on File Prior to Visit  Medication Sig Dispense Refill  . amiodarone (PACERONE) 200 MG tablet Take 1 tablet (200 mg total) by mouth daily. 30 tablet 0  . apixaban (ELIQUIS) 5 MG TABS tablet Take 1 tablet (5 mg total) by mouth 2 (two) times daily. 60 tablet 0  . atorvastatin (LIPITOR) 40 MG tablet Take 1 tablet (40 mg total) by mouth daily at 6 PM. 30 tablet 0  . dicyclomine (BENTYL) 10 MG capsule Take 1 capsule by mouth 4 (four) times daily - after meals and at bedtime.     Marland Kitchen JANUVIA 100 MG tablet Take 100 mg by mouth daily.    Marland Kitchen lisinopril (PRINIVIL,ZESTRIL) 5 MG tablet Take 1 tablet by mouth daily.    . midodrine (PROAMATINE) 10 MG tablet Take 1 tablet (10 mg total) by mouth 3 (three) times daily with meals. 90 tablet 0  . morphine (MS CONTIN) 60 MG 12 hr tablet Take 1 tablet (60 mg total) by mouth 2 (two) times daily. 20 tablet 0  . pantoprazole (PROTONIX) 40 MG tablet Take 1 tablet (40 mg total) by mouth 2 (two) times daily. 60 tablet 0  . valACYclovir (VALTREX) 1000 MG tablet Take 1 tablet (1,000 mg total) by mouth 3 (three) times daily.  21 tablet 0  . cefUROXime (CEFTIN) 500 MG tablet Take 1 tablet (500 mg total) by mouth 2 (two) times daily with a meal. (Patient not taking: Reported on 06/14/2015) 14 tablet 0  . lactulose (CHRONULAC) 10 GM/15ML solution Take 30 mLs (20 g total) by mouth 2 (two) times daily as needed for mild constipation. (Patient not taking: Reported on 06/14/2015) 240 mL 0  . metFORMIN (GLUCOPHAGE) 1000 MG tablet Take 1 tablet by mouth 2 (two) times daily.    . sucralfate (CARAFATE) 1 G tablet Take 1 tablet (1 g total) by mouth 4 (four) times daily. (Patient not taking: Reported on 06/14/2015) 30 tablet 0   No current facility-administered medications on file prior to visit.    BP 148/64 mmHg  Pulse 118  Temp(Src) 98.5 F (36.9 C) (Oral)  Ht 5' 3"  (1.6 m)  Wt 133 lb 12.8 oz (60.691 kg)  BMI 23.71 kg/m2  SpO2 99%    Objective:   Physical Exam  Constitutional: She is oriented to person, place, and time. She appears well-nourished.  HENT:  Head: Normocephalic.  Neck: Neck supple.  Cardiovascular: Normal rate and regular rhythm.   Pulmonary/Chest: Effort normal and breath sounds normal.  Neurological: She is alert and oriented to person, place, and time.  Skin: Skin is warm and dry.  Rash to right side of face representing shingles. She is currently undergoing treatment.  Psychiatric: She has a normal mood and affect.          Assessment & Plan:  >45 minutes spent face to face with patient, >50% spent counseling or coordinating care.

## 2015-06-14 NOTE — Assessment & Plan Note (Signed)
TSH slightly elevated during last hospitalization. Will continue to monitor and will recheck during next visit. Currently not managed on medication.

## 2015-06-14 NOTE — Assessment & Plan Note (Signed)
Managed on midodrine 10 mg daily. History of syncope. Toprol XL was decreased from 100 to 25, she understands this change.

## 2015-06-14 NOTE — Progress Notes (Signed)
Pre visit review using our clinic review tool, if applicable. No additional management support is needed unless otherwise documented below in the visit note. 

## 2015-06-14 NOTE — Assessment & Plan Note (Signed)
Does not currently follow with neurology. Currently on statin and eliquis. No residual symptoms per patient.

## 2015-06-14 NOTE — Telephone Encounter (Signed)
Will you please verify her current dose of Levemir? This is not on her current med list and was not on her hospital discharge summary; however she reports she's taking it at home. Thanks.

## 2015-06-14 NOTE — Assessment & Plan Note (Signed)
Managed on atorvastatin 40 mg. Lipid panel in September WNL. Denies myalgias. Will continue to monitor.

## 2015-06-14 NOTE — Assessment & Plan Note (Signed)
Stable today. Currently managed on toprol XL 25 mg, lisinopril 5 mg. Clonidine was d/c'd in hospital and toprol XL was reduced to 25 mg from 100 mg. We ensured she understood the changes made post hospitalization.

## 2015-06-14 NOTE — Assessment & Plan Note (Signed)
Managed on pantoprazole 40 mg which was recently increased during last hospitalization.

## 2015-06-14 NOTE — Assessment & Plan Note (Signed)
Managed on Januvia 100 mg, and Levemir 84 units at home? I asked patient to verify this dose and prescription for me when she gets home as this is not mentioned in her hospital discharge summary. A1C of 7.1 during hospitalization in September. Will recheck in December. Discussed when to report blood sugar results.

## 2015-06-14 NOTE — Assessment & Plan Note (Signed)
Currently managed on eliquis and amidarone. Regular rate today.  Denies palpitations or chest pain.

## 2015-06-14 NOTE — Assessment & Plan Note (Signed)
Currently on Morphine for chronic back pain. Referral made to pain management regarding issues. Tizanidine and vicodin discontinued in hospital.

## 2015-06-14 NOTE — Patient Instructions (Addendum)
Please call your cardiologist to reschedule your appointment very soon.  You will be contacted regarding your referral to pain management.  Please let us know if you have not heard back within one week.   Check your blood pressure daily, around the same time of day. Ensure that you have rested for 30 minutes prior to checking your blood pressure. Please notify me if you get readings consistently above 150/90 or below 100/60.   Please schedule a follow up appointment in 3 months (after December 8th).  It was a pleasure to meet you today! Please don't hesitate to call me with any questions. Welcome to [facility]!   Take a look at the list below for recommendations regarding your medications.    START taking these medications   Details  cefUROXime (CEFTIN) 500 MG tablet Take 1 tablet (500 mg total) by mouth 2 (two) times daily with a meal. Qty: 14 tablet, Refills: 0    lactulose (CHRONULAC) 10 GM/15ML solution Take 30 mLs (20 g total) by mouth 2 (two) times daily as needed for mild constipation. Qty: 240 mL, Refills: 0      CONTINUE these medications which have CHANGED   Details  amiodarone (PACERONE) 200 MG tablet Take 1 tablet (200 mg total) by mouth daily. Qty: 30 tablet, Refills: 0    metoprolol succinate (TOPROL-XL) 25 MG 24 hr tablet Take 1 tablet (25 mg total) by mouth daily. Qty: 30 tablet, Refills: 0    midodrine (PROAMATINE) 10 MG tablet Take 1 tablet (10 mg total) by mouth 3 (three) times daily with meals. Qty: 90 tablet, Refills: 0    morphine (MS CONTIN) 60 MG 12 hr tablet Take 1 tablet (60 mg total) by mouth 2 (two) times daily. Qty: 20 tablet, Refills: 0      CONTINUE these medications which have NOT CHANGED   Details  apixaban (ELIQUIS) 5 MG TABS tablet Take 1 tablet (5 mg total) by mouth 2 (two) times daily. Qty: 60 tablet, Refills: 0    atorvastatin (LIPITOR) 40 MG tablet Take 1 tablet (40 mg total) by mouth daily at 6  PM. Qty: 30 tablet, Refills: 0    dicyclomine (BENTYL) 10 MG capsule Take 1 capsule by mouth 4 (four) times daily.    JANUVIA 100 MG tablet Take 100 mg by mouth daily.    lisinopril (PRINIVIL,ZESTRIL) 5 MG tablet Take 1 tablet by mouth daily.    metFORMIN (GLUCOPHAGE) 1000 MG tablet Take 1 tablet by mouth 2 (two) times daily.    pantoprazole (PROTONIX) 40 MG tablet Take 1 tablet (40 mg total) by mouth 2 (two) times daily. Qty: 60 tablet, Refills: 0    sucralfate (CARAFATE) 1 G tablet Take 1 tablet (1 g total) by mouth 4 (four) times daily. Qty: 30 tablet, Refills: 0      STOP taking these medications     citalopram (CELEXA) 20 MG tablet      cloNIDine (CATAPRES) 0.1 MG tablet      collagenase (SANTYL) ointment      diltiazem (CARDIZEM CD) 120 MG 24 hr capsule      diphenhydrAMINE (BENADRYL) 25 mg capsule      HYDROcodone-acetaminophen (NORCO/VICODIN) 5-325 MG per tablet      metoCLOPramide (REGLAN) 10 MG tablet      tiZANidine (ZANAFLEX) 4 MG tablet      zolpidem (AMBIEN) 5 MG tablet      loperamide (IMODIUM A-D) 2 MG tablet      ondansetron (ZOFRAN ODT) 8  MG disintegrating tablet

## 2015-06-15 ENCOUNTER — Telehealth: Payer: Self-pay | Admitting: Primary Care

## 2015-06-15 NOTE — Telephone Encounter (Signed)
Noted. Will discuss with patient at next appointment.

## 2015-06-15 NOTE — Telephone Encounter (Signed)
Tried to call patient and phone kept ringing. No voicemail box. Will try again.

## 2015-06-15 NOTE — Telephone Encounter (Signed)
[place]. Was told the last time Levemir was prescribed back in 06/2014, it was 84 units daily and provider was Alvester Chou.

## 2015-06-15 NOTE — Telephone Encounter (Signed)
Tried to call patient again around 11:30 am and around 3:30 pm with no success.

## 2015-07-03 ENCOUNTER — Inpatient Hospital Stay
Admission: EM | Admit: 2015-07-03 | Discharge: 2015-07-11 | DRG: 391 | Disposition: A | Discharge status: Home / Self Care | Payer: Commercial Managed Care - HMO | Attending: Internal Medicine | Admitting: Internal Medicine

## 2015-07-03 DIAGNOSIS — K7469 Other cirrhosis of liver: Secondary | ICD-10-CM

## 2015-07-03 DIAGNOSIS — Z887 Allergy status to serum and vaccine status: Secondary | ICD-10-CM

## 2015-07-03 DIAGNOSIS — R197 Diarrhea, unspecified: Secondary | ICD-10-CM | POA: Diagnosis not present

## 2015-07-03 DIAGNOSIS — E440 Moderate protein-calorie malnutrition: Secondary | ICD-10-CM

## 2015-07-03 DIAGNOSIS — M25522 Pain in left elbow: Secondary | ICD-10-CM | POA: Diagnosis present

## 2015-07-03 DIAGNOSIS — R4182 Altered mental status, unspecified: Secondary | ICD-10-CM | POA: Diagnosis not present

## 2015-07-03 DIAGNOSIS — I4891 Unspecified atrial fibrillation: Secondary | ICD-10-CM

## 2015-07-03 DIAGNOSIS — R112 Nausea with vomiting, unspecified: Secondary | ICD-10-CM | POA: Diagnosis present

## 2015-07-03 DIAGNOSIS — I480 Paroxysmal atrial fibrillation: Secondary | ICD-10-CM | POA: Diagnosis not present

## 2015-07-03 DIAGNOSIS — E860 Dehydration: Secondary | ICD-10-CM | POA: Diagnosis not present

## 2015-07-03 DIAGNOSIS — A047 Enterocolitis due to Clostridium difficile: Secondary | ICD-10-CM | POA: Diagnosis present

## 2015-07-03 DIAGNOSIS — A0472 Enterocolitis due to Clostridium difficile, not specified as recurrent: Secondary | ICD-10-CM

## 2015-07-03 DIAGNOSIS — M25529 Pain in unspecified elbow: Secondary | ICD-10-CM

## 2015-07-03 DIAGNOSIS — E119 Type 2 diabetes mellitus without complications: Secondary | ICD-10-CM | POA: Diagnosis present

## 2015-07-03 DIAGNOSIS — K219 Gastro-esophageal reflux disease without esophagitis: Secondary | ICD-10-CM | POA: Diagnosis present

## 2015-07-03 DIAGNOSIS — R933 Abnormal findings on diagnostic imaging of other parts of digestive tract: Secondary | ICD-10-CM | POA: Diagnosis not present

## 2015-07-03 DIAGNOSIS — K625 Hemorrhage of anus and rectum: Secondary | ICD-10-CM | POA: Diagnosis not present

## 2015-07-03 DIAGNOSIS — K5732 Diverticulitis of large intestine without perforation or abscess without bleeding: Secondary | ICD-10-CM | POA: Diagnosis not present

## 2015-07-03 DIAGNOSIS — K5792 Diverticulitis of intestine, part unspecified, without perforation or abscess without bleeding: Principal | ICD-10-CM | POA: Diagnosis present

## 2015-07-03 DIAGNOSIS — R1010 Upper abdominal pain, unspecified: Secondary | ICD-10-CM | POA: Diagnosis not present

## 2015-07-03 DIAGNOSIS — K922 Gastrointestinal hemorrhage, unspecified: Secondary | ICD-10-CM | POA: Diagnosis present

## 2015-07-03 DIAGNOSIS — R109 Unspecified abdominal pain: Secondary | ICD-10-CM | POA: Diagnosis not present

## 2015-07-03 DIAGNOSIS — G9341 Metabolic encephalopathy: Secondary | ICD-10-CM | POA: Diagnosis not present

## 2015-07-03 DIAGNOSIS — Z881 Allergy status to other antibiotic agents status: Secondary | ICD-10-CM

## 2015-07-03 DIAGNOSIS — K7460 Unspecified cirrhosis of liver: Secondary | ICD-10-CM | POA: Diagnosis not present

## 2015-07-03 DIAGNOSIS — R030 Elevated blood-pressure reading, without diagnosis of hypertension: Secondary | ICD-10-CM | POA: Diagnosis not present

## 2015-07-03 DIAGNOSIS — M25521 Pain in right elbow: Secondary | ICD-10-CM | POA: Diagnosis not present

## 2015-07-03 DIAGNOSIS — R1902 Left upper quadrant abdominal swelling, mass and lump: Secondary | ICD-10-CM | POA: Diagnosis not present

## 2015-07-03 DIAGNOSIS — N134 Hydroureter: Secondary | ICD-10-CM | POA: Diagnosis not present

## 2015-07-03 DIAGNOSIS — I10 Essential (primary) hypertension: Secondary | ICD-10-CM | POA: Diagnosis not present

## 2015-07-03 DIAGNOSIS — R1084 Generalized abdominal pain: Secondary | ICD-10-CM | POA: Diagnosis not present

## 2015-07-03 DIAGNOSIS — M25429 Effusion, unspecified elbow: Secondary | ICD-10-CM

## 2015-07-03 DIAGNOSIS — Z7902 Long term (current) use of antithrombotics/antiplatelets: Secondary | ICD-10-CM | POA: Diagnosis not present

## 2015-07-03 DIAGNOSIS — Z8542 Personal history of malignant neoplasm of other parts of uterus: Secondary | ICD-10-CM | POA: Diagnosis not present

## 2015-07-03 DIAGNOSIS — R410 Disorientation, unspecified: Secondary | ICD-10-CM

## 2015-07-03 DIAGNOSIS — Z7901 Long term (current) use of anticoagulants: Secondary | ICD-10-CM

## 2015-07-03 DIAGNOSIS — Z8673 Personal history of transient ischemic attack (TIA), and cerebral infarction without residual deficits: Secondary | ICD-10-CM

## 2015-07-03 DIAGNOSIS — A084 Viral intestinal infection, unspecified: Secondary | ICD-10-CM | POA: Diagnosis not present

## 2015-07-03 DIAGNOSIS — R1013 Epigastric pain: Secondary | ICD-10-CM | POA: Diagnosis not present

## 2015-07-03 DIAGNOSIS — F419 Anxiety disorder, unspecified: Secondary | ICD-10-CM | POA: Diagnosis present

## 2015-07-03 DIAGNOSIS — R110 Nausea: Secondary | ICD-10-CM | POA: Diagnosis not present

## 2015-07-03 DIAGNOSIS — R1032 Left lower quadrant pain: Secondary | ICD-10-CM | POA: Diagnosis not present

## 2015-07-03 DIAGNOSIS — D490 Neoplasm of unspecified behavior of digestive system: Secondary | ICD-10-CM | POA: Diagnosis not present

## 2015-07-03 DIAGNOSIS — E785 Hyperlipidemia, unspecified: Secondary | ICD-10-CM | POA: Diagnosis present

## 2015-07-03 DIAGNOSIS — N200 Calculus of kidney: Secondary | ICD-10-CM | POA: Diagnosis present

## 2015-07-03 DIAGNOSIS — R10816 Epigastric abdominal tenderness: Secondary | ICD-10-CM | POA: Diagnosis not present

## 2015-07-03 DIAGNOSIS — R1110 Vomiting, unspecified: Secondary | ICD-10-CM

## 2015-07-03 DIAGNOSIS — E039 Hypothyroidism, unspecified: Secondary | ICD-10-CM | POA: Diagnosis present

## 2015-07-03 DIAGNOSIS — R531 Weakness: Secondary | ICD-10-CM

## 2015-07-03 LAB — URINALYSIS COMPLETE WITH MICROSCOPIC (ARMC ONLY)
BILIRUBIN URINE: NEGATIVE
Bacteria, UA: NONE SEEN
Glucose, UA: >500 mg/dL — AB
Leukocytes, UA: NEGATIVE
NITRITE: NEGATIVE
PH: 7 (ref 5.0–8.0)
PROTEIN: 100 mg/dL — AB
SPECIFIC GRAVITY, URINE: 1.01 (ref 1.005–1.030)
Squamous Epithelial / LPF: NONE SEEN

## 2015-07-03 LAB — CBC
HCT: 43.8 % (ref 35.0–47.0)
HEMOGLOBIN: 14.4 g/dL (ref 12.0–16.0)
MCH: 31.2 pg (ref 26.0–34.0)
MCHC: 32.9 g/dL (ref 32.0–36.0)
MCV: 94.7 fL (ref 80.0–100.0)
PLATELETS: 150 10*3/uL (ref 150–440)
RBC: 4.63 MIL/uL (ref 3.80–5.20)
RDW: 15.3 % — ABNORMAL HIGH (ref 11.5–14.5)
WBC: 15.6 10*3/uL — AB (ref 3.6–11.0)

## 2015-07-03 LAB — COMPREHENSIVE METABOLIC PANEL
ALBUMIN: 4.2 g/dL (ref 3.5–5.0)
ALT: 91 U/L — AB (ref 14–54)
AST: 106 U/L — AB (ref 15–41)
Alkaline Phosphatase: 132 U/L — ABNORMAL HIGH (ref 38–126)
Anion gap: 15 (ref 5–15)
BUN: 16 mg/dL (ref 6–20)
CHLORIDE: 100 mmol/L — AB (ref 101–111)
CO2: 20 mmol/L — AB (ref 22–32)
CREATININE: 0.86 mg/dL (ref 0.44–1.00)
Calcium: 8.9 mg/dL (ref 8.9–10.3)
GFR calc Af Amer: >60 mL/min (ref >60)
GFR calc non Af Amer: >60 mL/min (ref >60)
Glucose, Bld: 410 mg/dL — ABNORMAL HIGH (ref 65–99)
POTASSIUM: 3.1 mmol/L — AB (ref 3.5–5.1)
SODIUM: 135 mmol/L (ref 135–145)
TOTAL PROTEIN: 7.7 g/dL (ref 6.5–8.1)
Total Bilirubin: 3.3 mg/dL — ABNORMAL HIGH (ref 0.3–1.2)

## 2015-07-03 LAB — GLUCOSE, CAPILLARY: Glucose-Capillary: 374 mg/dL — ABNORMAL HIGH (ref 65–99)

## 2015-07-03 LAB — LIPASE, BLOOD: LIPASE: 20 U/L (ref 11–51)

## 2015-07-03 MED ORDER — ONDANSETRON HCL 4 MG/2ML IJ SOLN
4.0000 mg | Freq: Once | INTRAMUSCULAR | Status: AC
  Administered 2015-07-03: 4 mg via INTRAVENOUS
  Filled 2015-07-03: qty 2

## 2015-07-03 MED ORDER — HYDROMORPHONE HCL 1 MG/ML IJ SOLN
1.0000 mg | Freq: Once | INTRAMUSCULAR | Status: AC
  Administered 2015-07-03: 1 mg via INTRAVENOUS
  Filled 2015-07-03: qty 1

## 2015-07-03 MED ORDER — SODIUM CHLORIDE 0.9 % IV BOLUS (SEPSIS)
1000.0000 mL | Freq: Once | INTRAVENOUS | Status: AC
  Administered 2015-07-03: 1000 mL via INTRAVENOUS

## 2015-07-03 MED ORDER — IOHEXOL 240 MG/ML SOLN
25.0000 mL | Freq: Once | INTRAMUSCULAR | Status: AC | PRN
  Administered 2015-07-03: 25 mL via ORAL

## 2015-07-03 MED ORDER — FAMOTIDINE 20 MG PO TABS
40.0000 mg | ORAL_TABLET | Freq: Once | ORAL | Status: AC
  Administered 2015-07-03: 40 mg via ORAL
  Filled 2015-07-03: qty 2

## 2015-07-03 MED ORDER — SUCRALFATE 1 G PO TABS: 1.0000 g | ORAL_TABLET | Freq: Four times a day (QID) | ORAL | Status: DC

## 2015-07-03 MED ORDER — GI COCKTAIL ~~LOC~~
30.0000 mL | ORAL | Status: AC
  Administered 2015-07-03: 30 mL via ORAL
  Filled 2015-07-03: qty 30

## 2015-07-03 MED ORDER — METOCLOPRAMIDE HCL 5 MG/ML IJ SOLN
10.0000 mg | Freq: Once | INTRAMUSCULAR | Status: AC
  Administered 2015-07-03: 10 mg via INTRAVENOUS
  Filled 2015-07-03: qty 2

## 2015-07-03 MED ORDER — PROMETHAZINE HCL 25 MG PO TABS: 25.0000 mg | ORAL_TABLET | Freq: Four times a day (QID) | ORAL | Status: DC | PRN

## 2015-07-03 MED ORDER — RANITIDINE HCL 150 MG PO CAPS
150.0000 mg | ORAL_CAPSULE | Freq: Two times a day (BID) | ORAL | Status: DC
Start: 2015-07-03 — End: 2015-10-08

## 2015-07-03 MED ORDER — DIPHENHYDRAMINE HCL 50 MG/ML IJ SOLN
25.0000 mg | Freq: Once | INTRAMUSCULAR | Status: AC
  Administered 2015-07-03: 25 mg via INTRAVENOUS
  Filled 2015-07-03: qty 1

## 2015-07-03 MED ORDER — LABETALOL HCL 5 MG/ML IV SOLN
10.0000 mg | Freq: Once | INTRAVENOUS | Status: AC
  Administered 2015-07-04: 10 mg via INTRAVENOUS
  Filled 2015-07-03: qty 4

## 2015-07-03 NOTE — Discharge Instructions (Signed)

## 2015-07-03 NOTE — ED Notes (Signed)
Patient presents to Emergency Department via EMS with complaints of N/V/D for the last 3 days, unable to keep down HTN, and DM meds since then.  Pt c/o pain in upper abd and lower back (hx pancreatitus and DDD), last levemir 24 hrs ago 84 units.

## 2015-07-03 NOTE — ED Notes (Signed)
Patient transported to CT 

## 2015-07-03 NOTE — ED Provider Notes (Addendum)
[facility] Emergency Department Provider Note  ____________________________________________  Time seen: 9:55 PM  I have reviewed the triage vital signs and the nursing notes.   HISTORY  Chief Complaint Nausea; Emesis; and Diarrhea    HPI [name] is a 59 y.o. female who complains of generalized abdominal pain with nausea vomiting diarrhea for the past 3 days. This was preceded by 2 days of sore throat and nonproductive cough and runny nose. She denies sick contacts. No productive cough fevers or chills. No back pain chest pain shortness of breath or syncope.     Past Medical History  Diagnosis Date  . Type 1 diabetes ([place])   . Hypothyroid   . Degenerative disk disease   . Stomach ulcer   . Diverticulitis   . Syncope 01/2015  . Anxiety   . GERD (gastroesophageal reflux disease)   . History of hiatal hernia   . Cancer (HCC)     HX OF CANCER OF UTERUS   . TIA (transient ischemic attack) 02/2015  . Hypertension   . Pancreatitis   . PAF (paroxysmal atrial fibrillation) ([place]) 03/2015    a. new onset 03/2015 in setting of intractable N/V; b. on Eliquis 5 mg bid; c. CHADSVASc 4 (DM, TIA x 2, female)  . Intussusception intestine ([place]) 05/2015     Patient Active Problem List   Diagnosis Date Noted  . Chest pain   . Symptomatic bradycardia 05/27/2015  . Acute encephalopathy 05/27/2015  . Elevated troponin 05/27/2015  . Intussusception intestine ([place])   . Other specified hypothyroidism   . Abdominal pain, chronic, epigastric   . Diarrhea 05/19/2015  . Chronic anemia 05/19/2015  . Thrombocytopenia ([place]) 05/19/2015  . ARF (acute renal failure) ([place]) 05/19/2015  . Prolonged QT interval   . Hypomagnesemia   . Colitis   . Narcotic abuse   . Type 2 diabetes mellitus without complication ([place])   . Syncope 05/18/2015  . PAF (paroxysmal atrial fibrillation) ([place])   . Demand ischemia ([place]) 04/06/2015  . Hypokalemia 04/06/2015  . Hyperlipidemia  with target LDL less than 100 04/06/2015  . Protein-calorie malnutrition, severe ([place]) 04/05/2015  . CVA (cerebral infarction) 02/15/2015  . Dehydration 01/12/2015  . Essential hypertension 01/12/2015  . Orthostatic hypotension 01/12/2015  . Chronically on opiate therapy 01/12/2015  . Gastroparesis 01/12/2015  . DEPRESSION/ANXIETY 06/27/2007  . MYOFASCIAL PAIN SYNDROME 06/27/2007  . Chronic pain syndrome 03/28/2007  . GERD 03/27/2007  . DIVERTICULOSIS, COLON 03/27/2007  . LUMBAR DISC DISPLACEMENT 03/27/2007  . PROTEINURIA 03/27/2007  . UTERINE CANCER, HX OF 03/27/2007     Past Surgical History  Procedure Laterality Date  . Hernia repair    . Abdominal hysterectomy    . Cholecystectomy    . Esophagogastroduodenoscopy N/A 04/04/2015    Procedure: ESOPHAGOGASTRODUODENOSCOPY (EGD);  Surgeon: Hulen Luster, MD;  [facility] END[facility]COPY;  Service: Endoscopy;  Laterality: N/A;     Current Outpatient Rx  Name  Route  Sig  Dispense  Refill  . amiodarone (PACERONE) 200 MG tablet   Oral   Take 1 tablet (200 mg total) by mouth daily.   30 tablet   0   . apixaban (ELIQUIS) 5 MG TABS tablet   Oral   Take 1 tablet (5 mg total) by mouth 2 (two) times daily.   60 tablet   0   . atorvastatin (LIPITOR) 40 MG tablet   Oral   Take 1 tablet (40 mg total) by mouth daily at 6 PM.  30 tablet   0   . cefUROXime (CEFTIN) 500 MG tablet   Oral   Take 1 tablet (500 mg total) by mouth 2 (two) times daily with a meal. Patient not taking: Reported on 06/14/2015   14 tablet   0   . dicyclomine (BENTYL) 10 MG capsule   Oral   Take 1 capsule by mouth 4 (four) times daily - after meals and at bedtime.          Marland Kitchen JANUVIA 100 MG tablet   Oral   Take 100 mg by mouth daily.           Dispense as written.   . lactulose (CHRONULAC) 10 GM/15ML solution   Oral   Take 30 mLs (20 g total) by mouth 2 (two) times daily as needed for mild constipation. Patient not taking: Reported on 06/14/2015    240 mL   0   . lisinopril (PRINIVIL,ZESTRIL) 5 MG tablet   Oral   Take 1 tablet by mouth daily.         . metFORMIN (GLUCOPHAGE) 1000 MG tablet   Oral   Take 1 tablet by mouth 2 (two) times daily.         . metoprolol succinate (TOPROL-XL) 25 MG 24 hr tablet   Oral   Take 1 tablet (25 mg total) by mouth daily.   30 tablet   0   . midodrine (PROAMATINE) 10 MG tablet   Oral   Take 1 tablet (10 mg total) by mouth 3 (three) times daily with meals.   90 tablet   0   . morphine (MS CONTIN) 60 MG 12 hr tablet   Oral   Take 1 tablet (60 mg total) by mouth 2 (two) times daily.   20 tablet   0   . pantoprazole (PROTONIX) 40 MG tablet   Oral   Take 1 tablet (40 mg total) by mouth 2 (two) times daily.   60 tablet   0   . promethazine (PHENERGAN) 25 MG tablet   Oral   Take 25 mg by mouth every 6 (six) hours as needed for nausea or vomiting.         . promethazine (PHENERGAN) 25 MG tablet   Oral   Take 1 tablet (25 mg total) by mouth every 6 (six) hours as needed for nausea or vomiting.   15 tablet   0   . ranitidine (ZANTAC) 150 MG capsule   Oral   Take 1 capsule (150 mg total) by mouth 2 (two) times daily.   28 capsule   0   . sucralfate (CARAFATE) 1 G tablet   Oral   Take 1 tablet (1 g total) by mouth 4 (four) times daily.   120 tablet   1      Allergies Erythromycin base; Rosiglitazone maleate; Codeine sulfate; and Tetanus-diphtheria toxoids td   Family History  Problem Relation Age of Onset  . Hypertension Mother   . CAD Sister     Social History Social History  Substance Use Topics  . Smoking status: Never Smoker   . Smokeless tobacco: Never Used  . Alcohol Use: No    Review of Systems  Constitutional:   No fever or chills. No weight changes Eyes:   No blurry vision or double vision.  ENT:   No sore throat. Cardiovascular:   No chest pain. Respiratory:   No dyspnea or cough. Gastrointestinal:   Positive as above for abdominal pain,  with  vomiting and diarrhea.  No BRBPR or melena. Genitourinary:   Negative for dysuria, urinary retention, bloody urine, or difficulty urinating. Musculoskeletal:   Negative for back pain. No joint swelling or pain. Skin:   Negative for rash. Neurological:   Negative for headaches, focal weakness or numbness. Psychiatric:  No anxiety or depression.   Endocrine:  No hot/cold intolerance, changes in energy, or sleep difficulty.  10-point ROS otherwise negative.  ____________________________________________   PHYSICAL EXAM:  VITAL SIGNS: ED Triage Vitals  Enc Vitals Group     BP 07/03/15 2143 203/104 mmHg     Pulse Rate 07/03/15 2143 77     Resp 07/03/15 2143 18     Temp 07/03/15 2143 98.1 F (36.7 C)     Temp Source 07/03/15 2143 Oral     SpO2 07/03/15 2143 97 %     Weight 07/03/15 2143 133 lb (60.328 kg)     Height 07/03/15 2143 5' 3"  (1.6 m)     Head Cir --      Peak Flow --      Pain Score 07/03/15 2218 10     Pain Loc --      Pain Edu? --      Excl. in [place]? --      Constitutional:   Alert and oriented. Mild distress due to pain. Eyes:   No scleral icterus. No conjunctival pallor. PERRL. EOMI ENT   Head:   Normocephalic and atraumatic.   Nose:   No congestion/rhinnorhea. No septal hematoma   Mouth/Throat:   MMM, no pharyngeal erythema. No peritonsillar mass. No uvula shift.   Neck:   No stridor. No SubQ emphysema. No meningismus. Hematological/Lymphatic/Immunilogical:   No cervical lymphadenopathy. Cardiovascular:   RRR. Normal and symmetric distal pulses are present in all extremities. No murmurs, rubs, or gallops. Respiratory:   Normal respiratory effort without tachypnea nor retractions. Breath sounds are clear and equal bilaterally. No wheezes/rales/rhonchi. Gastrointestinal:   Soft with epigastric and left upper quadrant tenderness. No distention. There is no CVA tenderness.  No rebound, rigidity, or guarding. Genitourinary:    deferred Musculoskeletal:   Nontender with normal range of motion in all extremities. No joint effusions.  No lower extremity tenderness.  No edema. Neurologic:   Normal speech and language.  CN 2-10 normal. Motor grossly intact. No pronator drift.  Normal gait. No gross focal neurologic deficits are appreciated.  Skin:    Skin is warm, dry and intact. No rash noted.  No petechiae, purpura, or bullae. Psychiatric:   Mood and affect are normal. Speech and behavior are normal. Patient exhibits appropriate insight and judgment.  ____________________________________________    LABS (pertinent positives/negatives) (all labs ordered are listed, but only abnormal results are displayed) Labs Reviewed  COMPREHENSIVE METABOLIC PANEL - Abnormal; Notable for the following:    Potassium 3.1 (*)    Chloride 100 (*)    CO2 20 (*)    Glucose, Bld 410 (*)    AST 106 (*)    ALT 91 (*)    Alkaline Phosphatase 132 (*)    Total Bilirubin 3.3 (*)    All other components within normal limits  CBC - Abnormal; Notable for the following:    WBC 15.6 (*)    RDW 15.3 (*)    All other components within normal limits  GLUCOSE, CAPILLARY - Abnormal; Notable for the following:    Glucose-Capillary 374 (*)    All other components within normal limits  LIPASE, BLOOD  URINALYSIS COMPLETEWITH  MICROSCOPIC ([facility] ONLY)  CBG MONITORING, ED   ____________________________________________   EKG    ____________________________________________    RADIOLOGY    ____________________________________________   PROCEDURES   ____________________________________________   INITIAL IMPRESSION / ASSESSMENT AND PLAN / ED COURSE  Pertinent labs & imaging results that were available during my care of the patient were reviewed by me and considered in my medical decision making (see chart for details).  Patient presents with dentist pain nausea vomiting diarrhea is in the setting of what sounds like a viral  illness and viral gastroenteritis. We'll give him medications to help control this including antiemetics and pain medicine will checking labs.  ----------------------------------------- 10:37 PM on 07/03/2015 -----------------------------------------  Labs unremarkable. Vital signs stable. Patient is hypertensive but without acute symptoms relating to hypertensive crisis. Lipase is normal and I suspect that her symptoms are due to more of a gastritis and enteritis related to viral infection. We'll keep her on acid suppression and anti-medics and have her follow up with primary care this week. She does not have any symptoms of cystitis or urinary tract infection at this time. Low Suspicion for AAA  obstruction perforation or sepsis. Low suspicion for cardiopulmonary pathology.  ----------------------------------------- 11:09 PM on 07/03/2015 -----------------------------------------  Remainder of chemistry results not available which showed diffuse elevation of LFTs including transaminases alkaline phosphatase and total bilirubin. As recently as September 2016 these values were normal. She does have a history of cholecystectomy, so we'll proceed with CT assessment of the abdomen to assess for underlying pathology..  Patient is signed out to oncoming physician Dr. Dahlia Client pending the CT.     ____________________________________________   FINAL CLINICAL IMPRESSION(S) / ED DIAGNOSES  Final diagnoses:  Viral gastroenteritis      Carrie Mew, MD 07/03/15 3295  Carrie Mew, MD 07/03/15 2310

## 2015-07-04 ENCOUNTER — Observation Stay: Payer: Commercial Managed Care - HMO

## 2015-07-04 ENCOUNTER — Other Ambulatory Visit: Payer: Commercial Managed Care - HMO

## 2015-07-04 ENCOUNTER — Emergency Department: Payer: Commercial Managed Care - HMO

## 2015-07-04 ENCOUNTER — Encounter: Payer: Self-pay | Admitting: Radiology

## 2015-07-04 DIAGNOSIS — R112 Nausea with vomiting, unspecified: Secondary | ICD-10-CM | POA: Diagnosis present

## 2015-07-04 DIAGNOSIS — E440 Moderate protein-calorie malnutrition: Secondary | ICD-10-CM

## 2015-07-04 DIAGNOSIS — M25522 Pain in left elbow: Secondary | ICD-10-CM | POA: Diagnosis not present

## 2015-07-04 DIAGNOSIS — E785 Hyperlipidemia, unspecified: Secondary | ICD-10-CM | POA: Diagnosis not present

## 2015-07-04 DIAGNOSIS — I480 Paroxysmal atrial fibrillation: Secondary | ICD-10-CM | POA: Diagnosis not present

## 2015-07-04 DIAGNOSIS — M25521 Pain in right elbow: Secondary | ICD-10-CM | POA: Diagnosis not present

## 2015-07-04 LAB — GLUCOSE, CAPILLARY
GLUCOSE-CAPILLARY: 217 mg/dL — AB (ref 65–99)
GLUCOSE-CAPILLARY: 247 mg/dL — AB (ref 65–99)

## 2015-07-04 LAB — AMMONIA: Ammonia: 59 umol/L — ABNORMAL HIGH (ref 9–35)

## 2015-07-04 MED ORDER — PANTOPRAZOLE SODIUM 40 MG PO TBEC
40.0000 mg | DELAYED_RELEASE_TABLET | Freq: Two times a day (BID) | ORAL | Status: DC
Start: 2015-07-04 — End: 2015-07-11
  Administered 2015-07-04 – 2015-07-11 (×13): 40 mg via ORAL
  Filled 2015-07-04 (×15): qty 1

## 2015-07-04 MED ORDER — ACETAMINOPHEN 325 MG PO TABS
650.0000 mg | ORAL_TABLET | Freq: Four times a day (QID) | ORAL | Status: DC | PRN
  Administered 2015-07-05 – 2015-07-10 (×3): 650 mg via ORAL
  Filled 2015-07-04 (×3): qty 2

## 2015-07-04 MED ORDER — OXYCODONE HCL 5 MG PO TABS: 5.0000 mg | ORAL_TABLET | ORAL | Status: DC | PRN

## 2015-07-04 MED ORDER — IOHEXOL 300 MG/ML  SOLN
100.0000 mL | Freq: Once | INTRAMUSCULAR | Status: AC | PRN
  Administered 2015-07-04: 100 mL via INTRAVENOUS

## 2015-07-04 MED ORDER — HYDROMORPHONE HCL 1 MG/ML IJ SOLN
INTRAMUSCULAR | Status: AC
  Filled 2015-07-04: qty 1

## 2015-07-04 MED ORDER — APIXABAN 5 MG PO TABS
5.0000 mg | ORAL_TABLET | Freq: Two times a day (BID) | ORAL | Status: DC
  Administered 2015-07-04 – 2015-07-05 (×4): 5 mg via ORAL
  Filled 2015-07-04 (×4): qty 1

## 2015-07-04 MED ORDER — ATORVASTATIN CALCIUM 20 MG PO TABS
40.0000 mg | ORAL_TABLET | Freq: Every day | ORAL | Status: DC
  Administered 2015-07-04 – 2015-07-05 (×2): 40 mg via ORAL
  Filled 2015-07-04 (×2): qty 2

## 2015-07-04 MED ORDER — ONDANSETRON HCL 4 MG PO TABS
4.0000 mg | ORAL_TABLET | Freq: Four times a day (QID) | ORAL | Status: DC | PRN
  Administered 2015-07-09: 4 mg via ORAL
  Filled 2015-07-04: qty 1

## 2015-07-04 MED ORDER — ACETAMINOPHEN 650 MG RE SUPP: 650.0000 mg | Freq: Four times a day (QID) | RECTAL | Status: DC | PRN

## 2015-07-04 MED ORDER — AMIODARONE HCL 200 MG PO TABS
200.0000 mg | ORAL_TABLET | Freq: Every day | ORAL | Status: DC
  Administered 2015-07-04 – 2015-07-11 (×8): 200 mg via ORAL
  Filled 2015-07-04 (×8): qty 1

## 2015-07-04 MED ORDER — BOOST / RESOURCE BREEZE PO LIQD
1.0000 | Freq: Three times a day (TID) | ORAL | Status: DC
  Administered 2015-07-04 – 2015-07-11 (×17): 1 via ORAL

## 2015-07-04 MED ORDER — LORAZEPAM 2 MG/ML IJ SOLN
2.0000 mg | Freq: Once | INTRAMUSCULAR | Status: AC
  Administered 2015-07-04: 2 mg via INTRAVENOUS

## 2015-07-04 MED ORDER — INSULIN ASPART 100 UNIT/ML ~~LOC~~ SOLN
0.0000 [IU] | Freq: Every day | SUBCUTANEOUS | Status: DC
  Administered 2015-07-04: 2 [IU] via SUBCUTANEOUS
  Administered 2015-07-06: 3 [IU] via SUBCUTANEOUS
  Administered 2015-07-07 – 2015-07-09 (×3): 2 [IU] via SUBCUTANEOUS
  Filled 2015-07-04 (×3): qty 2
  Filled 2015-07-04: qty 3
  Filled 2015-07-04: qty 2

## 2015-07-04 MED ORDER — LISINOPRIL 5 MG PO TABS
5.0000 mg | ORAL_TABLET | Freq: Every day | ORAL | Status: DC
  Administered 2015-07-04 – 2015-07-11 (×7): 5 mg via ORAL
  Filled 2015-07-04 (×7): qty 1

## 2015-07-04 MED ORDER — HYDROMORPHONE HCL 1 MG/ML IJ SOLN
0.5000 mg | INTRAMUSCULAR | Status: DC | PRN
  Administered 2015-07-04: 06:00:00 0.5 mg via INTRAVENOUS

## 2015-07-04 MED ORDER — METOPROLOL SUCCINATE ER 25 MG PO TB24
25.0000 mg | ORAL_TABLET | Freq: Every day | ORAL | Status: DC
  Administered 2015-07-04 – 2015-07-11 (×8): 25 mg via ORAL
  Filled 2015-07-04 (×9): qty 1

## 2015-07-04 MED ORDER — LORAZEPAM 2 MG/ML IJ SOLN
INTRAMUSCULAR | Status: AC
  Filled 2015-07-04: qty 1

## 2015-07-04 MED ORDER — ONDANSETRON HCL 4 MG/2ML IJ SOLN
4.0000 mg | Freq: Four times a day (QID) | INTRAMUSCULAR | Status: DC | PRN
  Administered 2015-07-04 – 2015-07-11 (×6): 4 mg via INTRAVENOUS
  Filled 2015-07-04 (×6): qty 2

## 2015-07-04 MED ORDER — LACTULOSE ENEMA
300.0000 mL | Freq: Once | ORAL | Status: AC
  Administered 2015-07-05: 300 mL via RECTAL
  Filled 2015-07-04 (×2): qty 300

## 2015-07-04 MED ORDER — MORPHINE SULFATE ER 15 MG PO TBCR
60.0000 mg | EXTENDED_RELEASE_TABLET | Freq: Two times a day (BID) | ORAL | Status: DC
  Administered 2015-07-04 – 2015-07-07 (×7): 60 mg via ORAL
  Filled 2015-07-04 (×7): qty 4

## 2015-07-04 MED ORDER — INSULIN ASPART 100 UNIT/ML ~~LOC~~ SOLN
0.0000 [IU] | Freq: Three times a day (TID) | SUBCUTANEOUS | Status: DC
  Administered 2015-07-05: 18:00:00 3 [IU] via SUBCUTANEOUS
  Administered 2015-07-05: 5 [IU] via SUBCUTANEOUS
  Administered 2015-07-05 – 2015-07-06 (×2): 8 [IU] via SUBCUTANEOUS
  Administered 2015-07-06: 5 [IU] via SUBCUTANEOUS
  Administered 2015-07-06: 08:00:00 8 [IU] via SUBCUTANEOUS
  Administered 2015-07-07: 18:00:00 3 [IU] via SUBCUTANEOUS
  Administered 2015-07-07 (×2): 8 [IU] via SUBCUTANEOUS
  Administered 2015-07-08 (×3): 3 [IU] via SUBCUTANEOUS
  Administered 2015-07-09 (×2): 11 [IU] via SUBCUTANEOUS
  Administered 2015-07-10 (×3): 5 [IU] via SUBCUTANEOUS
  Administered 2015-07-11: 2 [IU] via SUBCUTANEOUS
  Administered 2015-07-11: 13:00:00 5 [IU] via SUBCUTANEOUS
  Filled 2015-07-04: qty 8
  Filled 2015-07-04: qty 3
  Filled 2015-07-04: qty 11
  Filled 2015-07-04: qty 5
  Filled 2015-07-04: qty 8
  Filled 2015-07-04: qty 5
  Filled 2015-07-04: qty 8
  Filled 2015-07-04: qty 3
  Filled 2015-07-04: qty 2
  Filled 2015-07-04: qty 8
  Filled 2015-07-04: qty 5
  Filled 2015-07-04: qty 3
  Filled 2015-07-04: qty 8
  Filled 2015-07-04: qty 3
  Filled 2015-07-04: qty 5
  Filled 2015-07-04: qty 3
  Filled 2015-07-04: qty 11
  Filled 2015-07-04 (×2): qty 5

## 2015-07-04 MED ORDER — MIDODRINE HCL 5 MG PO TABS
10.0000 mg | ORAL_TABLET | Freq: Three times a day (TID) | ORAL | Status: DC
Start: 2015-07-04 — End: 2015-07-11
  Administered 2015-07-04 – 2015-07-11 (×23): 10 mg via ORAL
  Filled 2015-07-04 (×23): qty 2

## 2015-07-04 MED ORDER — HYDRALAZINE HCL 20 MG/ML IJ SOLN
INTRAMUSCULAR | Status: AC
  Filled 2015-07-04: qty 1

## 2015-07-04 MED ORDER — HEPARIN SODIUM (PORCINE) 5000 UNIT/ML IJ SOLN: 5000.0000 [IU] | Freq: Three times a day (TID) | INTRAMUSCULAR | Status: DC

## 2015-07-04 MED ORDER — SODIUM CHLORIDE 0.9 % IV SOLN
INTRAVENOUS | Status: DC
  Administered 2015-07-04 – 2015-07-10 (×15): via INTRAVENOUS

## 2015-07-04 MED ORDER — HYDRALAZINE HCL 20 MG/ML IJ SOLN
10.0000 mg | INTRAMUSCULAR | Status: DC | PRN
  Administered 2015-07-04 – 2015-07-08 (×2): 10 mg via INTRAVENOUS
  Filled 2015-07-04: qty 1

## 2015-07-04 MED ORDER — DICYCLOMINE HCL 10 MG PO CAPS
10.0000 mg | ORAL_CAPSULE | Freq: Three times a day (TID) | ORAL | Status: DC
Start: 2015-07-04 — End: 2015-07-11
  Administered 2015-07-04 – 2015-07-11 (×27): 10 mg via ORAL
  Filled 2015-07-04 (×28): qty 1

## 2015-07-04 NOTE — Plan of Care (Signed)
Problem: Discharge Progression Outcomes Goal: Other Discharge Outcomes/Goals Outcome: Progressing Plan of care progress to goals: Discharge plan- pt is from home and would like to return to home at time of discharge.  Pain controlled- Hemodynamically stable- afebrile since arrival to room 120, VSS. Continue to monitor labs.  Complications resolved/controlled- Tolerating diet- c/o nausea, little improvement reported from ED RN.  Activity appropriate for discharge-pt needs assistance while up to [facility]. High fall risk with bed alarm activated. Pt verbalized understanding of use of call light and phone.

## 2015-07-04 NOTE — Evaluation (Signed)
Physical Therapy Evaluation Patient Details Name: [name] MRN: [id number] DOB: [birth year] Today's Date: 07/04/2015   History of Present Illness  Pt admitted with intractable nausea and vomiting as well as concern for gastric neoplasm. Pt also complaining of R and L elbow pain secondary to recent fall but radiographs were negative for fracture. Pt with history of frequent falls at home. PMH - syncope, HTN, chronic pain, afib, HTN, uterine CA. Pt is somewhat confused today and RN is aware and has spoken with GI who ordered a stat ammonia on her. Family reports 5 falls in the last 12 months and 3 in the last "couple months." Pt has 24/7 assistance at home from husband and sons. She ambulates with hand held assist and infrequent use of rolling walker  Clinical Impression  Pt is very lethargic and confused during evaluation. She is very unsteady on her feet with poor safety awareness and poor scanning of environment. Pt bumps into walls and loses balance during turns requiring constant verbal and tactile cues for safety and direction. Pt is grossly weak but demonstrates poor command follow due to confusion. Her balance is very poor and she is unable to remain upright in standing without UE support. Per family report current status is gross deviation from baseline. At the moment pt will need SNF placement at discharge. Her baseline function is overall relatively poor but better than current state. If she demonstrates improved command follow and improving cognition she may improve to the point where discharge plans can be updated. Pt will benefit from skilled PT services to address deficits in strength, balance, and mobility in order to return to full function at home.      Follow Up Recommendations SNF    Equipment Recommendations  None recommended by PT    Recommendations for Other Services       Precautions / Restrictions Precautions Precautions: Fall Restrictions Weight Bearing  Restrictions: No      Mobility  Bed Mobility Overal bed mobility: Needs Assistance Bed Mobility: Supine to Sit;Sit to Supine     Supine to sit: Supervision Sit to supine: Supervision   General bed mobility comments: Decreased speed and poor command follow but overall functional  Transfers Overall transfer level: Needs assistance Equipment used: Rolling walker (2 wheeled) Transfers: Sit to/from Stand Sit to Stand: Min assist         General transfer comment: Poor balance upon standing with posterior leaning, requiring therapist and bed on knees for support. Decreased LE strength and power as well as poor safety awareness  Ambulation/Gait Ambulation/Gait assistance: Min assist Ambulation Distance (Feet): 80 Feet Assistive device: Rolling walker (2 wheeled) Gait Pattern/deviations: Decreased step length - right;Decreased step length - left;Shuffle   Gait velocity interpretation: <1.8 ft/sec, indicative of risk for recurrent falls General Gait Details: Pt with poor safety awareness requiring verbal and tactile cues to stay within safe distance from walker and for proper safety with turns. Pt is very lethargic and confused. She closes her eyes intermittently and bumps into walls. Pt is unsteady requiring minA+1 correction for balance to prevent falls. Poor scanning of environment. Gait speed is considerably slower than function for limited community mobiilty. Family reports that current ambulation is grossly deviated from baseline  Stairs            Wheelchair Mobility    Modified Rankin (Stroke Patients Only)       Balance Overall balance assessment: Needs assistance Sitting-balance support: No upper extremity supported;Feet supported Sitting balance-Leahy Scale:  Good     Standing balance support: Bilateral upper extremity supported Standing balance-Leahy Scale: Poor                               Pertinent Vitals/Pain Pain Assessment: 0-10 Pain  Location: Reports chronic low back pain and bilateral elbow pain but will not rate with questioning. No grimacing or groaning during mobility Pain Intervention(s): Monitored during session    [place] expects to be discharged to:: Private residence Living Arrangements: Spouse/significant other Available Help at Discharge: Available 24 hours/day;Family Type of Home: Mobile home Home Access: Ramped entrance     Home Layout: One level Home Equipment: [place] - 2 wheels;Cane - single point (no BSC, no hospital bed)      Prior Function Level of Independence: Needs assistance   Gait / Transfers Assistance Needed: hand held assist for ambulation. Occasional use of rolling walker  ADL's / Homemaking Assistance Needed: Pt reports assist required for bathing  Comments: Pt reports she doesn't use walker much.     Hand Dominance   Dominant Hand: Right    Extremity/Trunk Assessment   Upper Extremity Assessment: Generalized weakness           Lower Extremity Assessment: Generalized weakness (Poor command follow with MMT. Denies numbness/tingling in LE)         Communication   Communication:  (Speech is slurred and pt somewhat confused)  Cognition Arousal/Alertness: Lethargic Behavior During Therapy: Flat affect Overall Cognitive Status: Impaired/Different from baseline Area of Impairment: Orientation;Attention Orientation Level: Disoriented to;Time;Situation Current Attention Level: Alternating           General Comments: Pt fades in and out of conversation. Occasionally conversation is not related to questioning at hand    General Comments      Exercises        Assessment/Plan    PT Assessment Patient needs continued PT services  PT Diagnosis Difficulty walking;Abnormality of gait;Generalized weakness   PT Problem List Decreased strength;Decreased activity tolerance;Decreased balance;Decreased mobility;Decreased cognition;Decreased knowledge  of use of DME;Decreased safety awareness;Pain  PT Treatment Interventions DME instruction;Gait training;Stair training;Functional mobility training;Therapeutic activities;Therapeutic exercise;Neuromuscular re-education;Balance training;Cognitive remediation;Patient/family education   PT Goals (Current goals can be found in the Care Plan section) Acute Rehab PT Goals Patient Stated Goal: "I'll do whatever to get better" PT Goal Formulation: With patient/family Time For Goal Achievement: 07/18/15 Potential to Achieve Goals: Fair    Frequency Min 2X/week   Barriers to discharge        Co-evaluation               End of Session Equipment Utilized During Treatment: Gait belt Activity Tolerance: Other (comment) (Confusion) Patient left: in bed;with call bell/phone within reach;with bed alarm set;with family/visitor present Nurse Communication: Mobility status;Other (comment) (Confusion)    Functional Assessment Tool Used: clinical judgement, gait speed Functional Limitation: Mobility: Walking and moving around Mobility: Walking and Moving Around Current Status [phone]): At least 40 percent but less than 60 percent impaired, limited or restricted Mobility: Walking and Moving Around Goal Status [phone]): At least 20 percent but less than 40 percent impaired, limited or restricted    Time: 1545-1605 PT Time Calculation (min) (ACUTE ONLY): 20 min   Charges:   PT Evaluation $Initial PT Evaluation Tier I: 1 Procedure     PT G Codes:   PT G-Codes **NOT FOR INPATIENT CLASS** Functional Assessment Tool Used: clinical judgement, gait speed Functional Limitation:  Mobility: Walking and moving around Mobility: Walking and Moving Around Current Status [phone]): At least 40 percent but less than 60 percent impaired, limited or restricted Mobility: Walking and Moving Around Goal Status [phone]): At least 20 percent but less than 40 percent impaired, limited or restricted   Phillips Grout PT,  DPT   Huprich,Jason 07/04/2015, 4:43 PM

## 2015-07-04 NOTE — Consult Note (Signed)
GI Inpatient Consult Note  Reason for Consult: Thickening of proximal stomach   Attending Requesting Consult: Dr. Manuella Ghazi  History of Present Illness: [name] is a 59 y.o. female who has a significant pmh of liver cirrhosis of unknown origin, diabetes, gastroparesis, AFib, GERD, uterine cancer presents with epigastric abdominal pain for the last couple of days.  She describes this as stabbing pain that radiates to her back.  She reports that she also began vomiting after every meal over the last couple of days as well.  She reports that she has been unable to keep any foods down.  She denies diarrhea, denies seeing any blood in emesis or stool.  She denies any emesis since she has been admitted, and has been able to keep clear liquids down.  Patient is very confused, hard to keep her history straight, also was hallucinating while I was in the room, thought her dog was on her bed with her, however, she was oriented to time and place.  She reports she takes lactulose once a day at home.  She reports heartburn and acid reflux 2 or 3 times a day, regardless that she is on Protonix twice a day, Zantac twice a day and uses Carafate.  She has a history of gastroparesis and uses Reglan.  She reports chest pain and shortness of breath on a daily basis, reports she sleeps on the couch every day to help with her shortness of breath.   she recently had an upper endoscopy completed on April 04, 2015 with Dr. Davonna Belling.  Indications were epigastric abdominal pain, nausea with vomiting, history of gastroparesis.  Findings included the examined esophagus was normal,  Chronic gastritis, normal examined duodenum.  Pathology reported oxyntic mucosa with changes consistent with proton pump inhibitor affect, negative for H pylori., dysplasia and malignancy.    Her last colonoscopy was October 11, 2014 with Dr. Davonna Belling.  Indications were generalized abdominal pain, abnormal CT of the GI tract.  Impression was entire examined  colon was normal.  Pathology showed everything was within normal limits, repeat colonoscopy in 10 years.  She is on Eliquis and her last dose was today.  Past Medical History:  Past Medical History  Diagnosis Date  . Type 1 diabetes ([place])   . Hypothyroid   . Degenerative disk disease   . Stomach ulcer   . Diverticulitis   . Syncope 01/2015  . Anxiety   . GERD (gastroesophageal reflux disease)   . History of hiatal hernia   . Cancer ([facility])     HX OF CANCER OF UTERUS   . TIA (transient ischemic attack) 02/2015  . Hypertension   . Pancreatitis   . PAF (paroxysmal atrial fibrillation) ([place]) 03/2015    a. new onset 03/2015 in setting of intractable N/V; b. on Eliquis 5 mg bid; c. CHADSVASc 4 (DM, TIA x 2, female)  . Intussusception intestine ([place]) 05/2015    Problem List: Patient Active Problem List   Diagnosis Date Noted  . Intractable nausea and vomiting 07/04/2015  . Chest pain   . Symptomatic bradycardia 05/27/2015  . Acute encephalopathy 05/27/2015  . Elevated troponin 05/27/2015  . Intussusception intestine ([place])   . Other specified hypothyroidism   . Abdominal pain, chronic, epigastric   . Diarrhea 05/19/2015  . Chronic anemia 05/19/2015  . Thrombocytopenia ([place]) 05/19/2015  . ARF (acute renal failure) ([place]) 05/19/2015  . Prolonged QT interval   . Hypomagnesemia   . Colitis   . Narcotic abuse   .  Type 2 diabetes mellitus without complication ([place])   . Syncope 05/18/2015  . PAF (paroxysmal atrial fibrillation) ([place])   . Demand ischemia ([place]) 04/06/2015  . Hypokalemia 04/06/2015  . Hyperlipidemia with target LDL less than 100 04/06/2015  . Protein-calorie malnutrition, severe ([place]) 04/05/2015  . CVA (cerebral infarction) 02/15/2015  . Dehydration 01/12/2015  . Essential hypertension 01/12/2015  . Orthostatic hypotension 01/12/2015  . Chronically on opiate therapy 01/12/2015  . Gastroparesis 01/12/2015  . DEPRESSION/ANXIETY 06/27/2007  . MYOFASCIAL PAIN SYNDROME  06/27/2007  . Chronic pain syndrome 03/28/2007  . GERD 03/27/2007  . DIVERTICULOSIS, COLON 03/27/2007  . LUMBAR DISC DISPLACEMENT 03/27/2007  . PROTEINURIA 03/27/2007  . UTERINE CANCER, HX OF 03/27/2007    Past Surgical History: Past Surgical History  Procedure Laterality Date  . Hernia repair    . Abdominal hysterectomy    . Cholecystectomy    . Esophagogastroduodenoscopy N/A 04/04/2015    Procedure: ESOPHAGOGASTRODUODENOSCOPY (EGD);  Surgeon: Hulen Luster, MD;  Location: [facility] ENDOSCOPY;  Service: Endoscopy;  Laterality: N/A;    Allergies: Allergies  Allergen Reactions  . Erythromycin Base Other (See Comments)    Reaction:  Fever   . Rosiglitazone Maleate Swelling  . Codeine Sulfate Rash  . Tetanus-Diphtheria Toxoids Td Rash and Other (See Comments)    Reaction:  Fever     Home Medications: Prescriptions prior to admission  Medication Sig Dispense Refill Last Dose  . amiodarone (PACERONE) 200 MG tablet Take 1 tablet (200 mg total) by mouth daily. 30 tablet 0 07/03/2015 at 1100  . apixaban (ELIQUIS) 5 MG TABS tablet Take 1 tablet (5 mg total) by mouth 2 (two) times daily. 60 tablet 0 07/03/2015 at 1100  . atorvastatin (LIPITOR) 40 MG tablet Take 1 tablet (40 mg total) by mouth daily at 6 PM. 30 tablet 0 07/03/2015 at 1100  . cefUROXime (CEFTIN) 500 MG tablet Take 1 tablet (500 mg total) by mouth 2 (two) times daily with a meal. 14 tablet 0 07/03/2015 at 1100  . dicyclomine (BENTYL) 10 MG capsule Take 1 capsule by mouth 4 (four) times daily - after meals and at bedtime.    07/03/2015 at 1100  . JANUVIA 100 MG tablet Take 100 mg by mouth daily.   07/03/2015 at 1100  . lactulose (CHRONULAC) 10 GM/15ML solution Take 30 mLs (20 g total) by mouth 2 (two) times daily as needed for mild constipation. 240 mL 0 07/03/2015 at 1100  . lisinopril (PRINIVIL,ZESTRIL) 5 MG tablet Take 1 tablet by mouth daily.   07/03/2015 at 1100  . metFORMIN (GLUCOPHAGE) 1000 MG tablet Take 1 tablet by mouth 2  (two) times daily.   07/03/2015 at 1100  . metoprolol succinate (TOPROL-XL) 25 MG 24 hr tablet Take 1 tablet (25 mg total) by mouth daily. 30 tablet 0 07/03/2015 at 1100  . midodrine (PROAMATINE) 10 MG tablet Take 1 tablet (10 mg total) by mouth 3 (three) times daily with meals. 90 tablet 0 07/03/2015 at 1100  . morphine (MS CONTIN) 60 MG 12 hr tablet Take 1 tablet (60 mg total) by mouth 2 (two) times daily. 20 tablet 0 07/03/2015 at 1100  . pantoprazole (PROTONIX) 40 MG tablet Take 1 tablet (40 mg total) by mouth 2 (two) times daily. 60 tablet 0 07/03/2015 at 1100  . promethazine (PHENERGAN) 25 MG tablet Take 25 mg by mouth every 6 (six) hours as needed for nausea or vomiting.   07/03/2015 at 1100  . psyllium (METAMUCIL) 58.6 % packet Take 1  packet by mouth every other day.   07/03/2015 at 1100   Home medication reconciliation was completed with the patient.   Scheduled Inpatient Medications:   . amiodarone  200 mg Oral Daily  . apixaban  5 mg Oral BID  . atorvastatin  40 mg Oral q1800  . dicyclomine  10 mg Oral TID PC & HS  . lisinopril  5 mg Oral Daily  . metoprolol succinate  25 mg Oral Daily  . midodrine  10 mg Oral TID WC  . morphine  60 mg Oral BID  . pantoprazole  40 mg Oral BID    Continuous Inpatient Infusions:   . sodium chloride 100 mL/hr at 07/04/15 0516    PRN Inpatient Medications:  acetaminophen **OR** acetaminophen, hydrALAZINE, HYDROmorphone (DILAUDID) injection, ondansetron **OR** ondansetron (ZOFRAN) IV, oxyCODONE  Family History: family history includes CAD in her sister; Hypertension in her mother.    Social History:   reports that she has never smoked. She has never used smokeless tobacco. She reports that she does not drink alcohol or use illicit drugs.    Review of Systems: Constitutional: Weight is stable.  Eyes: No changes in vision. ENT: No oral lesions, sore throat.  GI: see HPI.  Heme/Lymph: No easy bruising.  GU: No hematuria.   Integumentary: No rashes.  Neuro: No headaches.  Psych: No depression/anxiety.  Endocrine: No heat/cold intolerance.  Allergic/Immunologic: No urticaria.  Resp: No cough,   Musculoskeletal: No joint swelling.    Physical Examination: BP 135/76 mmHg  Pulse 88  Temp(Src) 98.4 F (36.9 C) (Oral)  Resp 18  Ht 5' 3"  (1.6 m)  Wt 60.328 kg (133 lb)  BMI 23.57 kg/m2  SpO2 100% Gen: NAD, oriented to place and year, but not day.  Actively hallucinating.  Scattered historian, would pause for a good amount of time before having to be prompted again for answer. HEENT: PEERLA, EOMI, Neck: supple, no JVD or thyromegaly Chest: CTA bilaterally, no wheezes, crackles, or other adventitious sounds CV: RRR, no m/g/c/r Abd: soft, epigastric tenderness, ND, +BS in all four quadrants; no HSM, guarding, ridigity, or rebound tenderness Ext: no edema, well perfused with 2+ pulses, Skin: no rash or lesions noted Lymph: no LAD  Data: Lab Results  Component Value Date   WBC 15.6* 07/03/2015   HGB 14.4 07/03/2015   HCT 43.8 07/03/2015   MCV 94.7 07/03/2015   PLT 150 07/03/2015    Recent Labs Lab 07/03/15 2147  HGB 14.4   Lab Results  Component Value Date   NA 135 07/03/2015   K 3.1* 07/03/2015   CL 100* 07/03/2015   CO2 20* 07/03/2015   BUN 16 07/03/2015   CREATININE 0.86 07/03/2015   Lab Results  Component Value Date   ALT 91* 07/03/2015   AST 106* 07/03/2015   ALKPHOS 132* 07/03/2015   BILITOT 3.3* 07/03/2015   No results for input(s): APTT, INR, PTT in the last 168 hours.  Imaging: CLINICAL DATA: 59 year old female with epigastric pain for the past 4 days. Nausea and vomiting. History of uterine cancer.  EXAM: CT ABDOMEN AND PELVIS WITH CONTRAST  TECHNIQUE: Multidetector CT imaging of the abdomen and pelvis was performed using the standard protocol following bolus administration of intravenous contrast.  CONTRAST: 166m OMNIPAQUE IOHEXOL 300 MG/ML  SOLN  COMPARISON: CT the abdomen and pelvis 05/17/2015.  FINDINGS: Lower chest: Unremarkable.  Hepatobiliary: The liver has a shrunken appearance and nodular contour, compatible with underlying cirrhosis. Multiple nodular appearing hypovascular areas on the  initial postcontrast images which were obtained slightly early, favored to represent a background of widespread regenerative nodules. No discrete hypervascular hepatic lesion is confidently identified. This unusual pattern in the liver seems to normalize on the more delayed images in the visualized portions of the liver. No intra or extrahepatic biliary ductal dilatation. Status post cholecystectomy.  Pancreas: No pancreatic mass. No pancreatic ductal dilatation. No pancreatic or peripancreatic fluid or inflammatory changes.  Spleen: Unremarkable.  Adrenals/Urinary Tract: Bilateral adrenal glands the left kidney are normal in appearance. Sub cm low-attenuation lesions in the right kidney are too small to definitively characterize, but are statistically likely to represent tiny cysts. 9 mm nonobstructive calculus in the lower pole collecting system of the right kidney. Mild right hydroureter. No calculi are identified along the course of either ureter or within the lumen of the urinary bladder. Urinary bladder is moderately distended. Bilateral adrenal glands are normal in appearance.  Stomach/Bowel: Profound thickening of the gastric wall immediately distal to the gastroesophageal junction, somewhat mass-like in appearance, best appreciated on image 17 of series 2 where this measures up to 7.1 x 3.6 cm. No pathologic dilatation of small bowel or colon.  Vascular/Lymphatic: Atherosclerotic calcifications throughout the abdominal and pelvic vasculature, without evidence of aneurysm or dissection. No lymphadenopathy noted in the abdomen or pelvis.  Reproductive: Status post hysterectomy. Ovaries are not  confidently identified may be surgically absent or atrophic.  Other: Small volume of ascites. No pneumoperitoneum.  Musculoskeletal: There are no aggressive appearing lytic or blastic lesions noted in the visualized portions of the skeleton.  IMPRESSION: 1. Mass-like thickening of the proximal stomach highly concerning for neoplasm. Correlation with nonemergent endoscopy is recommended in the near future to exclude neoplasm. 2. Morphologic changes in the liver most compatible with underlying cirrhosis, as discussed above. The possibility of metastatic disease to the liver is not excluded, but is not strongly favored on the basis of today's examination. This could be more definitively evaluated with followup nonemergent MRI of the abdomen with and without IV gadolinium if clinically appropriate. 3. 9 mm nonobstructive calculus in the lower pole collecting system of the right kidney. 4. Mild right hydroureter, without frank hydronephrosis. This is of uncertain etiology and significance, but could indicate mild distal ureteral stricture related to remote passage of a stone. 5. Atherosclerosis.   Electronically Signed  By: Vinnie Langton M.D.  On: 07/04/2015 00:53  Assessment/Plan: Ms. Campton is a 59 y.o. female with abnormal CT of abdomen showing thickening of proximal stomach, concerning for neoplasm.  Recent EGD 04/04/2015 was negative for malignancy.  Patient also confused/ hallucinating with elevated ammonia of 59, lactulose enema was given. On Eliquis, last dose was given today.  Recommendations: We agree with lactulose and monitoring cognitive status.  She had recent EGD that shows she is S/P [employer] Fundoplication and the imaging from the EGD shows thickening in that area, could be consistant with this.  For re-evaluation by endoscopy she will need to be off Eliquis, as biopsies will be needed if there is a mass.  Patient needs to have a f/u appointment in our office  so we may follow her cirrhosis management. Thank you for the consult. Please call with questions or concerns.  Salvadore Farber, PA-C  I personally performed these services.

## 2015-07-04 NOTE — Plan of Care (Signed)
Problem: Discharge Progression Outcomes Goal: Other Discharge Outcomes/Goals Outcome: Progressing Plan of care progress to goals: Discharge plan-from home and would like to return to home at time of discharge.   Pain controlled-with scheduled medication. Hemodynamically stable- afebrile. VSS. IVF's Infusing.   Complications resolved/controlled-Zofran 83m IV given for nausea. Improved. Tolerating diet- Clear Liquids. Zofran 437mIV given for nausea. No emesis. Improved.   Activity appropriate for discharge-[facility]. Pt verbalized understanding of use of call light and phone.

## 2015-07-04 NOTE — Progress Notes (Signed)
[facility] NAME: [name]    MR#:  [id number]  DATE OF BIRTH:  [birth year]  SUBJECTIVE:  CHIEF COMPLAINT:  Patient is still nauseous. Denies any vomiting. Reporting both elbow pain from fall. Reporting epigastric pain 10 out of 10 , not seen by any oncologist lately  REVIEW OF SYSTEMS:  CONSTITUTIONAL: No fever, fatigue or weakness.  EYES: No blurred or double vision.  EARS, NOSE, AND THROAT: No tinnitus or ear pain.  RESPIRATORY: No cough, shortness of breath, wheezing or hemoptysis.  CARDIOVASCULAR: No chest pain, orthopnea, edema.  GASTROINTESTINAL:   reporting nausea,  deniesvomiting, diarrhea or reports epigastric  abdominal pain.  GENITOURINARY: No dysuria, hematuria.  ENDOCRINE: No polyuria, nocturia,  HEMATOLOGY: No anemia, easy bruising or bleeding SKIN: No rash or lesion. MUSCULOSKELETAL: Reporting bilateral elbow pains No joint pain or arthritis.   NEUROLOGIC: No tingling, numbness, weakness.  PSYCHIATRY: No anxiety or depression.   DRUG ALLERGIES:   Allergies  Allergen Reactions  . Erythromycin Base Other (See Comments)    Reaction:  Fever   . Rosiglitazone Maleate Swelling  . Codeine Sulfate Rash  . Tetanus-Diphtheria Toxoids Td Rash and Other (See Comments)    Reaction:  Fever     VITALS:  Blood pressure 118/69, pulse 94, temperature 99.1 F (37.3 C), temperature source Oral, resp. rate 18, height 5' 3"  (1.6 m), weight 60.328 kg (133 lb), SpO2 97 %.  PHYSICAL EXAMINATION:  GENERAL:  59 y.o.-year-old patient lying in the bed with no acute distress.  EYES: Pupils equal, round, reactive to light and accommodation. No scleral icterus. Extraocular muscles intact.  HEENT: Head atraumatic, normocephalic. Oropharynx and nasopharynx clear.  NECK:  Supple, no jugular venous distention. No thyroid enlargement, no tenderness.  LUNGS: Normal breath sounds bilaterally, no wheezing, rales,rhonchi or  crepitation. No use of accessory muscles of respiration.  CARDIOVASCULAR: S1, S2 normal. No murmurs, rubs, or gallops.  ABDOMEN: Soft, epigastric area is tender, nondistended. Bowel sounds present. No organomegaly or mass.  EXTREMITIES: No pedal edema, cyanosis, or clubbing.  bilateral elbow area is tender. NEUROLOGIC: Cranial nerves II through XII are intact. Muscle strength 5/5 in all extremities. Sensation intact. Gait not checked.  PSYCHIATRIC: The patient is alert and oriented x 3.  SKIN: No obvious rash, lesion, or ulcer.  bruises are noticed on her skin on her extremities  LABORATORY PANEL:   CBC  Recent Labs Lab 07/03/15 2147  WBC 15.6*  HGB 14.4  HCT 43.8  PLT 150   ------------------------------------------------------------------------------------------------------------------  Chemistries   Recent Labs Lab 07/03/15 2147  NA 135  K 3.1*  CL 100*  CO2 20*  GLUCOSE 410*  BUN 16  CREATININE 0.86  CALCIUM 8.9  AST 106*  ALT 91*  ALKPHOS 132*  BILITOT 3.3*   ------------------------------------------------------------------------------------------------------------------  Cardiac Enzymes No results for input(s): TROPONINI in the last 168 hours. ------------------------------------------------------------------------------------------------------------------  RADIOLOGY:  Dg Elbow 2 Views Right  07/04/2015  CLINICAL DATA:  Elbow pain, no known injury EXAM: RIGHT ELBOW - 2 VIEW COMPARISON:  None. FINDINGS: Two views of the right elbow submitted. No acute fracture or subluxation. No posterior fat pad sign. Exostosis or osteophyte is noted in distal right humerus. IMPRESSION: Negative. Electronically Signed   By: Lahoma Crocker M.D.   On: 07/04/2015 15:00   Dg Elbow Complete Left  07/04/2015  CLINICAL DATA:  Elbow pain, no known injury, diabetes, history of uterine cancer EXAM: LEFT ELBOW - COMPLETE 3+ VIEW  COMPARISON:  None. FINDINGS: Four views of left elbow  submitted. No acute fracture or subluxation. No posterior fat pad sign. No pathologic calcifications are noted. IMPRESSION: Negative. Electronically Signed   By: Lahoma Crocker M.D.   On: 07/04/2015 14:59   Ct Abdomen Pelvis W Contrast  07/04/2015  CLINICAL DATA:  59 year old female with epigastric pain for the past 4 days. Nausea and vomiting. History of uterine cancer. EXAM: CT ABDOMEN AND PELVIS WITH CONTRAST TECHNIQUE: Multidetector CT imaging of the abdomen and pelvis was performed using the standard protocol following bolus administration of intravenous contrast. CONTRAST:  192m OMNIPAQUE IOHEXOL 300 MG/ML  SOLN COMPARISON:  CT the abdomen and pelvis 05/17/2015. FINDINGS: Lower chest:  Unremarkable. Hepatobiliary: The liver has a shrunken appearance and nodular contour, compatible with underlying cirrhosis. Multiple nodular appearing hypovascular areas on the initial postcontrast images which were obtained slightly early, favored to represent a background of widespread regenerative nodules. No discrete hypervascular hepatic lesion is confidently identified. This unusual pattern in the liver seems to normalize on the more delayed images in the visualized portions of the liver. No intra or extrahepatic biliary ductal dilatation. Status post cholecystectomy. Pancreas: No pancreatic mass. No pancreatic ductal dilatation. No pancreatic or peripancreatic fluid or inflammatory changes. Spleen: Unremarkable. Adrenals/Urinary Tract: Bilateral adrenal glands the left kidney are normal in appearance. Sub cm low-attenuation lesions in the right kidney are too small to definitively characterize, but are statistically likely to represent tiny cysts. 9 mm nonobstructive calculus in the lower pole collecting system of the right kidney. Mild right hydroureter. No calculi are identified along the course of either ureter or within the lumen of the urinary bladder. Urinary bladder is moderately distended. Bilateral adrenal  glands are normal in appearance. Stomach/Bowel: Profound thickening of the gastric wall immediately distal to the gastroesophageal junction, somewhat mass-like in appearance, best appreciated on image 17 of series 2 where this measures up to 7.1 x 3.6 cm. No pathologic dilatation of small bowel or colon. Vascular/Lymphatic: Atherosclerotic calcifications throughout the abdominal and pelvic vasculature, without evidence of aneurysm or dissection. No lymphadenopathy noted in the abdomen or pelvis. Reproductive: Status post hysterectomy. Ovaries are not confidently identified may be surgically absent or atrophic. Other: Small volume of ascites.  No pneumoperitoneum. Musculoskeletal: There are no aggressive appearing lytic or blastic lesions noted in the visualized portions of the skeleton. IMPRESSION: 1. Mass-like thickening of the proximal stomach highly concerning for neoplasm. Correlation with nonemergent endoscopy is recommended in the near future to exclude neoplasm. 2. Morphologic changes in the liver most compatible with underlying cirrhosis, as discussed above. The possibility of metastatic disease to the liver is not excluded, but is not strongly favored on the basis of today's examination. This could be more definitively evaluated with followup nonemergent MRI of the abdomen with and without IV gadolinium if clinically appropriate. 3. 9 mm nonobstructive calculus in the lower pole collecting system of the right kidney. 4. Mild right hydroureter, without frank hydronephrosis. This is of uncertain etiology and significance, but could indicate mild distal ureteral stricture related to remote passage of a stone. 5. Atherosclerosis. Electronically Signed   By: DVinnie LangtonM.D.   On: 07/04/2015 00:53    EKG:   Orders placed or performed during the hospital encounter of 06/02/15  . EKG 12-Lead  . EKG 12-Lead  . ED EKG within 10 minutes  . ED EKG within 10 minutes  . EKG    ASSESSMENT AND PLAN:    59year old Caucasian female history  of type 2 diabetes non-insulin-requiring as was history of uterine cancer presenting with abdominal pain  1. Intractable nausea/vomiting: Still nauseous. Continue Supportive care IV fluid hydration and antiemetics 2. Concern for gastric neoplasm: Given history of uterine cancer lunch syndrome is a possibility, consult gastroenterology for possible endoscopic ultrasound and biopsy. Consult oncology as well for establishment of care 3. Hyperlipidemia unspecified Lipitor 4. Paroxysmal atrial fibrillation: Amiodarone, and apixaban 5. Type 2 diabetes non-insulin-requiring: Hold oral agents at insulin sliding scale 6. Right and left elbow pain status post fall likely secondary to problem #1 with dehydration-will get x-rays of both elbows PT consult is placed 7. Venous thrombi embolism prophylactic: Therapeutic anticoagulants     All the records are reviewed and case discussed with Care Management/Social Workerr. Management plans discussed with the patient, family and they are in agreement.  CODE STATUS: Full code  TOTAL TIME TAKING CARE OF THIS PATIENT: 35 minutes.   POSSIBLE D/C IN 2-3 DAYS, DEPENDING ON CLINICAL CONDITION.   Nicholes Mango M.D on 07/04/2015 at 3:21 PM  Between 7am to 6pm - Pager - [phone] After 6pm go to [email or web address] - password [email or web address] [facility]  [place] [facility]  Office  [phone]  CC: Primary care physician; Sheral Flow, NP

## 2015-07-04 NOTE — Progress Notes (Signed)
Dr Margaretmary Eddy made aware that RN reports increase confusion, ammonia level 59, new order for lactulose enema now and again in 8 hours and ammonia lab in the morning

## 2015-07-04 NOTE — Consult Note (Signed)
Please see full GI consult by [name]. Patient seen and examined, chart reviewed. Patient presenting with nausea and emesis. She has had no other emesis since admission however continues with epigastric discomfort/pain. CT scan showing a thickening of the region of the GE junction and cardia of the stomach. I have reviewed previous CT scans and this was not apparent on a CT scan done 05/17/2015 however was apparent on a CT scan done 04/03/2015. I will need to ask radiology to re-review these films. Patient also with a history of cirrhosis of undetermined etiology.  Of note patient underwent EGD on 04/04/2015 with endoscopy report clearly showing the fundoplication as well as some suture material and retroflex view. She is currently on Eliquis. This would need to be held prior to consideration of EGD. Once I have been able to discuss the different scans with radiology further recommendations to follow. Continue symptomatic treatment for nausea. Of note patient has hyperammonemia and has been started on lactulose. She is tolerating by mouth liquids and may be able to switch to to oral dosing tomorrow.

## 2015-07-04 NOTE — ED Notes (Signed)
spoke to pt's husband with permission and pt used phone, [phone]

## 2015-07-04 NOTE — H&P (Signed)
[place] [facility] [place] NAME: [name]    MR#:  [id number]  DATE OF BIRTH:  [birth year]   DATE OF ADMISSION:  07/03/2015  PRIMARY CARE PHYSICIAN: Sheral Flow, NP   REQUESTING/REFERRING PHYSICIAN: Dahlia Client  CHIEF COMPLAINT:   Chief Complaint  Patient presents with  . Nausea  . Emesis  . Diarrhea    HISTORY OF PRESENT ILLNESS:  [name]  is a 59 y.o. female with a known history of type 2 diabetes non-insulin-requiring, gerd without esophagitis, history of uterine cancer presenting with abdominal pain. She describes three-day duration of abdominal pain and epigastric in location described only as pain intensity 8/10 nonradiating no worsening or relieving factors no relation with food She also describes persistent nausea and vomiting nonbloody nonbilious emesis and loose stools. Denies any fevers or chills   PAST MEDICAL HISTORY:   Past Medical History  Diagnosis Date  . Type 1 diabetes ([place])   . Hypothyroid   . Degenerative disk disease   . Stomach ulcer   . Diverticulitis   . Syncope 01/2015  . Anxiety   . GERD (gastroesophageal reflux disease)   . History of hiatal hernia   . Cancer ([facility])     HX OF CANCER OF UTERUS   . TIA (transient ischemic attack) 02/2015  . Hypertension   . Pancreatitis   . PAF (paroxysmal atrial fibrillation) ([place]) 03/2015    a. new onset 03/2015 in setting of intractable N/V; b. on Eliquis 5 mg bid; c. CHADSVASc 4 (DM, TIA x 2, female)  . Intussusception intestine ([place]) 05/2015    PAST SURGICAL HISTORY:   Past Surgical History  Procedure Laterality Date  . Hernia repair    . Abdominal hysterectomy    . Cholecystectomy    . Esophagogastroduodenoscopy N/A 04/04/2015    Procedure: ESOPHAGOGASTRODUODENOSCOPY (EGD);  Surgeon: Hulen Luster, MD;  Location: [facility] ENDOSCOPY;  Service: Endoscopy;  Laterality: N/A;    SOCIAL HISTORY:   Social History  Substance Use Topics  . Smoking  status: Never Smoker   . Smokeless tobacco: Never Used  . Alcohol Use: No    FAMILY HISTORY:   Family History  Problem Relation Age of Onset  . Hypertension Mother   . CAD Sister     DRUG ALLERGIES:   Allergies  Allergen Reactions  . Erythromycin Base Other (See Comments)    Reaction:  Fever   . Rosiglitazone Maleate Swelling  . Codeine Sulfate Rash  . Tetanus-Diphtheria Toxoids Td Rash and Other (See Comments)    Reaction:  Fever     REVIEW OF SYSTEMS:  REVIEW OF SYSTEMS:  CONSTITUTIONAL: Denies fevers, chills, positive fatigue, weakness.  EYES: Denies blurred vision, double vision, or eye pain.  EARS, NOSE, THROAT: Denies tinnitus, ear pain, hearing loss.  RESPIRATORY: denies cough, shortness of breath, wheezing  CARDIOVASCULAR: Denies chest pain, palpitations, edema.  GASTROINTESTINAL: Positive nausea, vomiting, diarrhea, abdominal pain.  GENITOURINARY: Denies dysuria, hematuria.  ENDOCRINE: Denies nocturia or thyroid problems. HEMATOLOGIC AND LYMPHATIC: Denies easy bruising or bleeding.  SKIN: Denies rash or lesions.  MUSCULOSKELETAL: Denies pain in neck, back, shoulder, knees, hips, or further arthritic symptoms.  NEUROLOGIC: Denies paralysis, paresthesias.  PSYCHIATRIC: Denies anxiety or depressive symptoms. Otherwise full review of systems performed by me is negative.   MEDICATIONS AT HOME:   Prior to Admission medications   Medication Sig Start Date End Date Taking? Authorizing Provider  psyllium (METAMUCIL) 58.6 % packet Take 1 packet  by mouth every other day.   Yes Historical Provider, MD  amiodarone (PACERONE) 200 MG tablet Take 1 tablet (200 mg total) by mouth daily. 05/31/15   Geradine Girt, DO  apixaban (ELIQUIS) 5 MG TABS tablet Take 1 tablet (5 mg total) by mouth 2 (two) times daily. 04/07/15   Bettey Costa, MD  atorvastatin (LIPITOR) 40 MG tablet Take 1 tablet (40 mg total) by mouth daily at 6 PM. 02/17/15   Bettey Costa, MD  cefUROXime (CEFTIN) 500 MG  tablet Take 1 tablet (500 mg total) by mouth 2 (two) times daily with a meal. Patient not taking: Reported on 06/14/2015 05/31/15   Geradine Girt, DO  dicyclomine (BENTYL) 10 MG capsule Take 1 capsule by mouth 4 (four) times daily - after meals and at bedtime.  03/11/15   Historical Provider, MD  JANUVIA 100 MG tablet Take 100 mg by mouth daily. 12/15/14   Historical Provider, MD  lactulose (CHRONULAC) 10 GM/15ML solution Take 30 mLs (20 g total) by mouth 2 (two) times daily as needed for mild constipation. Patient not taking: Reported on 06/14/2015 05/31/15   Geradine Girt, DO  lisinopril (PRINIVIL,ZESTRIL) 5 MG tablet Take 1 tablet by mouth daily. 03/15/15   Historical Provider, MD  metFORMIN (GLUCOPHAGE) 1000 MG tablet Take 1 tablet by mouth 2 (two) times daily. 03/11/15   Historical Provider, MD  metoprolol succinate (TOPROL-XL) 25 MG 24 hr tablet Take 1 tablet (25 mg total) by mouth daily. 06/14/15   [name], NP  midodrine (PROAMATINE) 10 MG tablet Take 1 tablet (10 mg total) by mouth 3 (three) times daily with meals. 05/31/15   Geradine Girt, DO  morphine (MS CONTIN) 60 MG 12 hr tablet Take 1 tablet (60 mg total) by mouth 2 (two) times daily. 05/31/15   Geradine Girt, DO  pantoprazole (PROTONIX) 40 MG tablet Take 1 tablet (40 mg total) by mouth 2 (two) times daily. 04/07/15   Bettey Costa, MD  promethazine (PHENERGAN) 25 MG tablet Take 25 mg by mouth every 6 (six) hours as needed for nausea or vomiting.    Historical Provider, MD  promethazine (PHENERGAN) 25 MG tablet Take 1 tablet (25 mg total) by mouth every 6 (six) hours as needed for nausea or vomiting. 07/03/15   Carrie Mew, MD  ranitidine (ZANTAC) 150 MG capsule Take 1 capsule (150 mg total) by mouth 2 (two) times daily. 07/03/15   Carrie Mew, MD  sucralfate (CARAFATE) 1 G tablet Take 1 tablet (1 g total) by mouth 4 (four) times daily. 07/03/15   Carrie Mew, MD      VITAL SIGNS:  Blood pressure 182/107, pulse 85,  temperature 98.1 F (36.7 C), temperature source Oral, resp. rate 17, height 5' 3"  (1.6 m), weight 133 lb (60.328 kg), SpO2 95 %.  PHYSICAL EXAMINATION:  VITAL SIGNS: Filed Vitals:   07/04/15 0251  BP: 182/107  Pulse: 85  Temp:   Resp: 46   GENERAL:59 y.o.female currently in no acute distress.  HEAD: Normocephalic, atraumatic.  EYES: Pupils equal, round, reactive to light. Extraocular muscles intact. No scleral icterus.  MOUTH: Moist mucosal membrane. Dentition intact. No abscess noted.  EAR, NOSE, THROAT: Clear without exudates. No external lesions.  NECK: Supple. No thyromegaly. No nodules. No JVD.  PULMONARY: Clear to ascultation, without wheeze rails or rhonci. No use of accessory muscles, Good respiratory effort. good air entry bilaterally CHEST: Nontender to palpation.  CARDIOVASCULAR: S1 and S2. Regular rate and rhythm. No  murmurs, rubs, or gallops. No edema. Pedal pulses 2+ bilaterally.  GASTROINTESTINAL: Soft, minimal tenderness epigastric region without rebound or guarding, nondistended. No masses. Positive bowel sounds. No hepatosplenomegaly.  MUSCULOSKELETAL: No swelling, clubbing, or edema. Range of motion full in all extremities.  NEUROLOGIC: Cranial nerves II through XII are intact. No gross focal neurological deficits. Sensation intact. Reflexes intact.  SKIN: No ulceration, lesions, rashes, or cyanosis. Skin warm and dry. Turgor intact.  PSYCHIATRIC: Mood, affect within normal limits. The patient is awake, alert and oriented x 3. Insight, judgment intact.    LABORATORY PANEL:   CBC  Recent Labs Lab 07/03/15 2147  WBC 15.6*  HGB 14.4  HCT 43.8  PLT 150   ------------------------------------------------------------------------------------------------------------------  Chemistries   Recent Labs Lab 07/03/15 2147  NA 135  K 3.1*  CL 100*  CO2 20*  GLUCOSE 410*  BUN 16  CREATININE 0.86  CALCIUM 8.9  AST 106*  ALT 91*  ALKPHOS 132*  BILITOT 3.3*    ------------------------------------------------------------------------------------------------------------------  Cardiac Enzymes No results for input(s): TROPONINI in the last 168 hours. ------------------------------------------------------------------------------------------------------------------  RADIOLOGY:  Ct Abdomen Pelvis W Contrast  07/04/2015  CLINICAL DATA:  59 year old female with epigastric pain for the past 4 days. Nausea and vomiting. History of uterine cancer. EXAM: CT ABDOMEN AND PELVIS WITH CONTRAST TECHNIQUE: Multidetector CT imaging of the abdomen and pelvis was performed using the standard protocol following bolus administration of intravenous contrast. CONTRAST:  141m OMNIPAQUE IOHEXOL 300 MG/ML  SOLN COMPARISON:  CT the abdomen and pelvis 05/17/2015. FINDINGS: Lower chest:  Unremarkable. Hepatobiliary: The liver has a shrunken appearance and nodular contour, compatible with underlying cirrhosis. Multiple nodular appearing hypovascular areas on the initial postcontrast images which were obtained slightly early, favored to represent a background of widespread regenerative nodules. No discrete hypervascular hepatic lesion is confidently identified. This unusual pattern in the liver seems to normalize on the more delayed images in the visualized portions of the liver. No intra or extrahepatic biliary ductal dilatation. Status post cholecystectomy. Pancreas: No pancreatic mass. No pancreatic ductal dilatation. No pancreatic or peripancreatic fluid or inflammatory changes. Spleen: Unremarkable. Adrenals/Urinary Tract: Bilateral adrenal glands the left kidney are normal in appearance. Sub cm low-attenuation lesions in the right kidney are too small to definitively characterize, but are statistically likely to represent tiny cysts. 9 mm nonobstructive calculus in the lower pole collecting system of the right kidney. Mild right hydroureter. No calculi are identified along the course  of either ureter or within the lumen of the urinary bladder. Urinary bladder is moderately distended. Bilateral adrenal glands are normal in appearance. Stomach/Bowel: Profound thickening of the gastric wall immediately distal to the gastroesophageal junction, somewhat mass-like in appearance, best appreciated on image 17 of series 2 where this measures up to 7.1 x 3.6 cm. No pathologic dilatation of small bowel or colon. Vascular/Lymphatic: Atherosclerotic calcifications throughout the abdominal and pelvic vasculature, without evidence of aneurysm or dissection. No lymphadenopathy noted in the abdomen or pelvis. Reproductive: Status post hysterectomy. Ovaries are not confidently identified may be surgically absent or atrophic. Other: Small volume of ascites.  No pneumoperitoneum. Musculoskeletal: There are no aggressive appearing lytic or blastic lesions noted in the visualized portions of the skeleton. IMPRESSION: 1. Mass-like thickening of the proximal stomach highly concerning for neoplasm. Correlation with nonemergent endoscopy is recommended in the near future to exclude neoplasm. 2. Morphologic changes in the liver most compatible with underlying cirrhosis, as discussed above. The possibility of metastatic disease to the liver is not excluded,  but is not strongly favored on the basis of today's examination. This could be more definitively evaluated with followup nonemergent MRI of the abdomen with and without IV gadolinium if clinically appropriate. 3. 9 mm nonobstructive calculus in the lower pole collecting system of the right kidney. 4. Mild right hydroureter, without frank hydronephrosis. This is of uncertain etiology and significance, but could indicate mild distal ureteral stricture related to remote passage of a stone. 5. Atherosclerosis. Electronically Signed   By: Vinnie Langton M.D.   On: 07/04/2015 00:53    EKG:   Orders placed or performed during the hospital encounter of 06/02/15  . EKG  12-Lead  . EKG 12-Lead  . ED EKG within 10 minutes  . ED EKG within 10 minutes  . EKG    IMPRESSION AND PLAN:   59 year old Caucasian female history of type 2 diabetes non-insulin-requiring as was history of uterine cancer presenting with abdominal pain  1. Intractable nausea/vomiting: Supportive care IV fluid hydration 2. Concern for gastric neoplasm: Given history of uterine cancer lunch syndrome is a possibility, consult gastroenterology for possible endoscopic ultrasound and biopsy 3. Hyperlipidemia unspecified Lipitor 4. Paroxysmal atrial fibrillation: Amiodarone, and apixaban 5. Type 2 diabetes non-insulin-requiring: Hold oral agents at insulin sliding scale 6. Venous thrombi embolism prophylactic: Therapeutic anticoagulants   All the records are reviewed and case discussed with ED provider. Management plans discussed with the patient, family and they are in agreement.  CODE STATUS: Full  TOTAL TIME TAKING CARE OF THIS PATIENT: 35 minutes.    Hower,  Karenann Cai.D on 07/04/2015 at 3:11 AM  Between 7am to 6pm - Pager - [phone]  After 6pm: House Pager: [phone]  [facility]  Office  [phone]  CC: Primary care physician; Sheral Flow, NP

## 2015-07-04 NOTE — Progress Notes (Addendum)
Initial Nutrition Assessment  DOCUMENTATION CODES:  Non-severe (moderate) malnutrition in context of chronic illness however likely progressing to severe.   INTERVENTION:   Coordination of Care: await diet progression as medically able Medical Food Supplement Therapy: will recommend Boost Breeze po TID, each supplement provides 250 kcal and 9 grams of protein and will recommend when diet advanced to change supplement to Glucerna for better blood glucose management.    NUTRITION DIAGNOSIS:   Inadequate oral intake related to nausea, vomiting as evidenced by per patient/family report.  GOAL:   Patient will meet greater than or equal to 90% of their needs  MONITOR:    (Energy Intake, Glucose Profile, Anthropometrics, Digestive system)  REASON FOR ASSESSMENT:   Malnutrition Screening Tool    ASSESSMENT:   Pt admitted with intractable n/v and abdominal pain. Per MD note, questionable gastric neoplasm secondary to h/o uterine cancer. Pt with h/o T2DM per diabetes coordinator note.  Past Medical History  Diagnosis Date  . Type 1 diabetes ([place])   . Hypothyroid   . Degenerative disk disease   . Stomach ulcer   . Diverticulitis   . Syncope 01/2015  . Anxiety   . GERD (gastroesophageal reflux disease)   . History of hiatal hernia   . Cancer (HCC)     HX OF CANCER OF UTERUS   . TIA (transient ischemic attack) 02/2015  . Hypertension   . Pancreatitis   . PAF (paroxysmal atrial fibrillation) ([place]) 03/2015    a. new onset 03/2015 in setting of intractable N/V; b. on Eliquis 5 mg bid; c. CHADSVASc 4 (DM, TIA x 2, female)  . Intussusception intestine ([place]) 05/2015    Diet Order:  Diet clear liquid Room service appropriate?: Yes; Fluid consistency:: Thin    Current Nutrition: Pt reports not having breakfast this am secondary to abdominal pain.   Food/Nutrition-Related History: Pt reports not eating for the past 3 weeks secondary to n/v and abdominal pain. Pt reports having had  Ensure on last admission in February but not at home.    Scheduled Medications:  . amiodarone  200 mg Oral Daily  . apixaban  5 mg Oral BID  . atorvastatin  40 mg Oral q1800  . dicyclomine  10 mg Oral TID PC & HS  . feeding supplement  1 Container Oral TID WC  . hydrALAZINE      . HYDROmorphone      . lisinopril  5 mg Oral Daily  . LORazepam      . metoprolol succinate  25 mg Oral Daily  . midodrine  10 mg Oral TID WC  . morphine  60 mg Oral BID  . pantoprazole  40 mg Oral BID    Continuous Medications:  . sodium chloride 100 mL/hr at 07/04/15 0854     Electrolyte/Renal Profile and Glucose Profile:   Recent Labs Lab 07/03/15 2147  NA 135  K 3.1*  CL 100*  CO2 20*  BUN 16  CREATININE 0.86  CALCIUM 8.9  GLUCOSE 410*   Protein Profile:  Recent Labs Lab 07/03/15 2147  ALBUMIN 4.2    Gastrointestinal Profile: Last BM:  07/04/2015   Nutrition-Focused Physical Exam Findings: Nutrition-Focused physical exam completed. Findings are no fat depletion, mild-moderate muscle depletion, and no edema.     Weight Change: Pt reports weight fluctuates PTA but that in September her weight was 138lbs (4% weight loss in 2 months) Per [facility] pt weight 133lbs at the beginning of October. Upon chart review RD notes  weight of 146lbs 01/11/2015 (9% weight loss in 5 months).   Height:   Ht Readings from Last 1 Encounters:  07/03/15 5' 3"  (1.6 m)    Weight:   Wt Readings from Last 1 Encounters:  07/03/15 133 lb (60.328 kg)    Wt Readings from Last 10 Encounters:  07/03/15 133 lb (60.328 kg)  06/14/15 133 lb 12.8 oz (60.691 kg)  06/07/15 137 lb (62.143 kg)  06/02/15 137 lb (62.143 kg)  05/27/15 131 lb 4.8 oz (59.557 kg)  05/22/15 146 lb 14.4 oz (66.633 kg)  05/17/15 135 lb (61.236 kg)  04/13/15 140 lb (63.504 kg)  04/10/15 145 lb (65.772 kg)  04/03/15 130 lb (58.968 kg)    BMI:  Body mass index is 23.57 kg/(m^2).  Estimated Nutritional Needs:   Kcal:  BEE: 1134kcals,  TEE: (IF 1.1-1.3)(AF 1.2)  1500-1769kcals  Protein:  60-72g protein (1.0-1.2g/kg)  Fluid:  1500-1838m of fluid (25-353mkg)  EDUCATION NEEDS:   Education needs no appropriate at this time   [email or web address], LDN Pager [phone]

## 2015-07-04 NOTE — ED Provider Notes (Signed)
-----------------------------------------   1:51 AM on 07/04/2015 -----------------------------------------   Blood pressure 195/106, pulse 88, temperature 98.1 F (36.7 C), temperature source Oral, resp. rate 16, height 5' 3"  (1.6 m), weight 133 lb (60.328 kg), SpO2 97 %.  Assuming care from Dr. Joni Fears.  In short, [name] is a 59 y.o. female with a chief complaint of Nausea; Emesis; and Diarrhea .  Refer to the original H&P for additional details.  The current plan of care is to follow-up the results of the CT scan.  CT scan: Mass like thickening of the proximal stomach highly concerning for neoplasm, correlation with nonemergent endoscopy is recommended in the near future, morphologic changes in the liver most compatible with underlying cirrhosis, the possibility of metastatic disease to the liver is not excluded. 9 mm nonobstructive calculus in the lower pole collecting system of the right kidney. Mild right hydroureter without frank hydronephrosis.  I discussed the results with the patient and asked how her nausea and vomiting were doing. The patient reports that she is still unable to drink without vomiting and still feels nauseous. Given the symptoms continued as well as the results of her CT scan I will admit the patient to the hospitalist service for further evaluation.   Loney Hering, MD 07/04/15 [phone]

## 2015-07-04 NOTE — Progress Notes (Addendum)
Inpatient Diabetes Program Recommendations  AACE/ADA: New Consensus Statement on Inpatient Glycemic Control (2015)  Target Ranges:  Prepandial:   less than 140 mg/dL      Peak postprandial:   less than 180 mg/dL (1-2 hours)      Critically ill patients:  140 - 180 mg/dL  Results for [name], [name] (MRN [id number]) as of 07/04/2015 09:27  Ref. Range 07/03/2015 21:47  Glucose Latest Ref Range: 65-99 mg/dL 410 (H)   Review of Glycemic Control  Diabetes history: DM2 Outpatient Diabetes medications: Januvia 100 mg daily, Metformin 1000 mg BID Current orders for Inpatient glycemic control: None  Inpatient Diabetes Program Recommendations: Correction (SSI): Please order CBGs with Novolog sensitive correction scale ACHS.  Thanks, Barnie Alderman, RN, MSN, CCRN, CDE Diabetes Coordinator Inpatient Diabetes Program [phone] (Team Pager from [place] to [place]) [phone] ([facility] office) [phone] [facility] office) [phone] [facility] office)

## 2015-07-05 DIAGNOSIS — D490 Neoplasm of unspecified behavior of digestive system: Secondary | ICD-10-CM | POA: Diagnosis not present

## 2015-07-05 DIAGNOSIS — R112 Nausea with vomiting, unspecified: Secondary | ICD-10-CM | POA: Diagnosis not present

## 2015-07-05 DIAGNOSIS — I480 Paroxysmal atrial fibrillation: Secondary | ICD-10-CM | POA: Diagnosis not present

## 2015-07-05 LAB — CBC
HCT: 39.2 % (ref 35.0–47.0)
Hemoglobin: 12.9 g/dL (ref 12.0–16.0)
MCH: 31.7 pg (ref 26.0–34.0)
MCHC: 32.9 g/dL (ref 32.0–36.0)
MCV: 96.3 fL (ref 80.0–100.0)
PLATELETS: 102 10*3/uL — AB (ref 150–440)
RBC: 4.07 MIL/uL (ref 3.80–5.20)
RDW: 16.2 % — ABNORMAL HIGH (ref 11.5–14.5)
WBC: 11.7 10*3/uL — ABNORMAL HIGH (ref 3.6–11.0)

## 2015-07-05 LAB — BASIC METABOLIC PANEL
ANION GAP: 9 (ref 5–15)
BUN: 22 mg/dL — ABNORMAL HIGH (ref 6–20)
CHLORIDE: 111 mmol/L (ref 101–111)
CO2: 23 mmol/L (ref 22–32)
CREATININE: 0.87 mg/dL (ref 0.44–1.00)
Calcium: 8.5 mg/dL — ABNORMAL LOW (ref 8.9–10.3)
GFR calc non Af Amer: >60 mL/min (ref >60)
GLUCOSE: 247 mg/dL — AB (ref 65–99)
Potassium: 3.1 mmol/L — ABNORMAL LOW (ref 3.5–5.1)
Sodium: 143 mmol/L (ref 135–145)

## 2015-07-05 LAB — GLUCOSE, CAPILLARY
Glucose-Capillary: 214 mg/dL — ABNORMAL HIGH (ref 65–99)
Glucose-Capillary: 264 mg/dL — ABNORMAL HIGH (ref 65–99)
Glucose-Capillary: 165 mg/dL — ABNORMAL HIGH (ref 65–99)
Glucose-Capillary: 149 mg/dL — ABNORMAL HIGH (ref 65–99)

## 2015-07-05 LAB — HEMOGLOBIN A1C: Hgb A1c MFr Bld: 9.2 % — ABNORMAL HIGH (ref 4.0–6.0)

## 2015-07-05 LAB — AMMONIA: Ammonia: 34 umol/L (ref 9–35)

## 2015-07-05 MED ORDER — ALPRAZOLAM 0.5 MG PO TABS
0.5000 mg | ORAL_TABLET | ORAL | Status: AC
  Administered 2015-07-05: 14:00:00 0.5 mg via ORAL
  Filled 2015-07-05: qty 1

## 2015-07-05 MED ORDER — LACTULOSE 10 GM/15ML PO SOLN
20.0000 g | Freq: Every day | ORAL | Status: DC
  Administered 2015-07-05 – 2015-07-10 (×6): 20 g via ORAL
  Filled 2015-07-05 (×6): qty 30

## 2015-07-05 MED ORDER — POTASSIUM CHLORIDE 10 MEQ/100ML IV SOLN
10.0000 meq | INTRAVENOUS | Status: AC
  Administered 2015-07-05 (×4): 10 meq via INTRAVENOUS
  Filled 2015-07-05 (×4): qty 100

## 2015-07-05 MED ORDER — LINAGLIPTIN 5 MG PO TABS
5.0000 mg | ORAL_TABLET | Freq: Every day | ORAL | Status: DC
  Administered 2015-07-05: 18:00:00 5 mg via ORAL
  Filled 2015-07-05: qty 1

## 2015-07-05 NOTE — Consult Note (Signed)
Subjective: Patient seen for nausea vomiting, epigastric pain and abnormal CT scan. Patient continues to have variable mental status. She was reported to have good clarity this morning and would answer questions appropriately. Currently she is very difficult to maintain being awake and minimal answers to questions. There is been no vomiting has been some continued abdominal pain, epigastric. sHe has had some loose stools associated with lactulose enemas  Objective: Vital signs in last 24 hours: Temp:  [98.2 F (36.8 C)-99 F (37.2 C)] 98.3 F (36.8 C) (10/25 1400) Pulse Rate:  [73-92] 92 (10/25 1400) Resp:  [18-20] 18 (10/25 1400) BP: (131-177)/(69-87) 146/84 mmHg (10/25 1400) SpO2:  [94 %-100 %] 96 % (10/25 1400) Blood pressure 146/84, pulse 92, temperature 98.3 F (36.8 C), temperature source Oral, resp. rate 18, height 5' 3"  (1.6 m), weight 60.328 kg (133 lb), SpO2 96 %.   Intake/Output from previous day: 10/24 0701 - 10/25 0700 In: 1985 [P.O.:720; I.V.:1265] Out: 1350 [Urine:1350]  Intake/Output this shift: Total I/O In: 1423.3 [I.V.:1023.3; IV Piggyback:400] Out: 675 [Stool:675]   General appearance:  Elderly-appearing 59 year old female no acute distress.Somnulent. Resp:  Clear to auscultation Cardio:  Regular rate and rhythm without rub or gallop GI:  Soft mild tenderness to palpation the epigastric region bowel sounds are positive. Is no rebound Extremities:  No clubbing cyanosis or edema   Lab Results: Results for orders placed or performed during the hospital encounter of 07/03/15 (from the past 24 hour(s))  Glucose, capillary     Status: Abnormal   Collection Time: 07/04/15  9:57 PM  Result Value Ref Range   Glucose-Capillary 217 (H) 65 - 99 mg/dL  Glucose, capillary     Status: Abnormal   Collection Time: 07/04/15 11:40 PM  Result Value Ref Range   Glucose-Capillary 247 (H) 65 - 99 mg/dL  Hemoglobin A1c     Status: Abnormal   Collection Time: 07/05/15  7:24 AM   Result Value Ref Range   Hgb A1c MFr Bld 9.2 (H) 4.0 - 6.0 %  Basic metabolic panel     Status: Abnormal   Collection Time: 07/05/15  7:24 AM  Result Value Ref Range   Sodium 143 135 - 145 mmol/L   Potassium 3.1 (L) 3.5 - 5.1 mmol/L   Chloride 111 101 - 111 mmol/L   CO2 23 22 - 32 mmol/L   Glucose, Bld 247 (H) 65 - 99 mg/dL   BUN 22 (H) 6 - 20 mg/dL   Creatinine, Ser 0.87 0.44 - 1.00 mg/dL   Calcium 8.5 (L) 8.9 - 10.3 mg/dL   GFR calc non Af Amer >60 >60 mL/min   GFR calc Af Amer >60 >60 mL/min   Anion gap 9 5 - 15  CBC     Status: Abnormal   Collection Time: 07/05/15  7:24 AM  Result Value Ref Range   WBC 11.7 (H) 3.6 - 11.0 K/uL   RBC 4.07 3.80 - 5.20 MIL/uL   Hemoglobin 12.9 12.0 - 16.0 g/dL   HCT 39.2 35.0 - 47.0 %   MCV 96.3 80.0 - 100.0 fL   MCH 31.7 26.0 - 34.0 pg   MCHC 32.9 32.0 - 36.0 g/dL   RDW 16.2 (H) 11.5 - 14.5 %   Platelets 102 (L) 150 - 440 K/uL  Ammonia     Status: None   Collection Time: 07/05/15  7:25 AM  Result Value Ref Range   Ammonia 34 9 - 35 umol/L  Glucose, capillary  Status: Abnormal   Collection Time: 07/05/15  7:49 AM  Result Value Ref Range   Glucose-Capillary 214 (H) 65 - 99 mg/dL  Glucose, capillary     Status: Abnormal   Collection Time: 07/05/15 11:50 AM  Result Value Ref Range   Glucose-Capillary 264 (H) 65 - 99 mg/dL  Glucose, capillary     Status: Abnormal   Collection Time: 07/05/15  4:30 PM  Result Value Ref Range   Glucose-Capillary 165 (H) 65 - 99 mg/dL      Recent Labs  07/03/15 2147 07/05/15 0724  WBC 15.6* 11.7*  HGB 14.4 12.9  HCT 43.8 39.2  PLT 150 102*   BMET  Recent Labs  07/03/15 2147 07/05/15 0724  NA 135 143  K 3.1* 3.1*  CL 100* 111  CO2 20* 23  GLUCOSE 410* 247*  BUN 16 22*  CREATININE 0.86 0.87  CALCIUM 8.9 8.5*   LFT  Recent Labs  07/03/15 2147  PROT 7.7  ALBUMIN 4.2  AST 106*  ALT 91*  ALKPHOS 132*  BILITOT 3.3*   PT/INR No results for input(s): LABPROT, INR in the last 72  hours. Hepatitis Panel No results for input(s): HEPBSAG, HCVAB, HEPAIGM, HEPBIGM in the last 72 hours. C-Diff No results for input(s): CDIFFTOX in the last 72 hours. No results for input(s): CDIFFPCR in the last 72 hours.   Studies/Results: Dg Elbow 2 Views Right  07/04/2015  CLINICAL DATA:  Elbow pain, no known injury EXAM: RIGHT ELBOW - 2 VIEW COMPARISON:  None. FINDINGS: Two views of the right elbow submitted. No acute fracture or subluxation. No posterior fat pad sign. Exostosis or osteophyte is noted in distal right humerus. IMPRESSION: Negative. Electronically Signed   By: Lahoma Crocker M.D.   On: 07/04/2015 15:00   Dg Elbow Complete Left  07/04/2015  CLINICAL DATA:  Elbow pain, no known injury, diabetes, history of uterine cancer EXAM: LEFT ELBOW - COMPLETE 3+ VIEW COMPARISON:  None. FINDINGS: Four views of left elbow submitted. No acute fracture or subluxation. No posterior fat pad sign. No pathologic calcifications are noted. IMPRESSION: Negative. Electronically Signed   By: Lahoma Crocker M.D.   On: 07/04/2015 14:59   Ct Abdomen Pelvis W Contrast  07/05/2015  ADDENDUM REPORT: 07/05/2015 17:10 ADDENDUM: The original report was by Dr. Madie Reno. The following addendum is by Dr. Van Clines: I received a call requesting comparison to the 04/03/2015 exam with regard to the masslike thickening in the gastroesophageal junction and stomach cardia. The appearance in this area on 07/03/2015 is very similar to the 04/03/2015 exam. By report, the patient had an endoscopy on 04/04/2015 with careful assessment of the stomach cardia and gastroesophageal junction. At the endoscopy, fundoplication was revealed along with gastritis but there was no mass. Given the similarity of the appearance on 07/03/2015 and 04/03/2015, this region has thus already been assessed by endoscopy. Accordingly, further endoscopy is not felt to be mandatory and in fact may be duplicative. Electronically Signed   By: Van Clines M.D.   On: 07/05/2015 17:10  07/05/2015  CLINICAL DATA:  59 year old female with epigastric pain for the past 4 days. Nausea and vomiting. History of uterine cancer. EXAM: CT ABDOMEN AND PELVIS WITH CONTRAST TECHNIQUE: Multidetector CT imaging of the abdomen and pelvis was performed using the standard protocol following bolus administration of intravenous contrast. CONTRAST:  136m OMNIPAQUE IOHEXOL 300 MG/ML  SOLN COMPARISON:  CT the abdomen and pelvis 05/17/2015. FINDINGS: Lower chest:  Unremarkable. Hepatobiliary: The  liver has a shrunken appearance and nodular contour, compatible with underlying cirrhosis. Multiple nodular appearing hypovascular areas on the initial postcontrast images which were obtained slightly early, favored to represent a background of widespread regenerative nodules. No discrete hypervascular hepatic lesion is confidently identified. This unusual pattern in the liver seems to normalize on the more delayed images in the visualized portions of the liver. No intra or extrahepatic biliary ductal dilatation. Status post cholecystectomy. Pancreas: No pancreatic mass. No pancreatic ductal dilatation. No pancreatic or peripancreatic fluid or inflammatory changes. Spleen: Unremarkable. Adrenals/Urinary Tract: Bilateral adrenal glands the left kidney are normal in appearance. Sub cm low-attenuation lesions in the right kidney are too small to definitively characterize, but are statistically likely to represent tiny cysts. 9 mm nonobstructive calculus in the lower pole collecting system of the right kidney. Mild right hydroureter. No calculi are identified along the course of either ureter or within the lumen of the urinary bladder. Urinary bladder is moderately distended. Bilateral adrenal glands are normal in appearance. Stomach/Bowel: Profound thickening of the gastric wall immediately distal to the gastroesophageal junction, somewhat mass-like in appearance, best appreciated on image  17 of series 2 where this measures up to 7.1 x 3.6 cm. No pathologic dilatation of small bowel or colon. Vascular/Lymphatic: Atherosclerotic calcifications throughout the abdominal and pelvic vasculature, without evidence of aneurysm or dissection. No lymphadenopathy noted in the abdomen or pelvis. Reproductive: Status post hysterectomy. Ovaries are not confidently identified may be surgically absent or atrophic. Other: Small volume of ascites.  No pneumoperitoneum. Musculoskeletal: There are no aggressive appearing lytic or blastic lesions noted in the visualized portions of the skeleton. IMPRESSION: 1. Mass-like thickening of the proximal stomach highly concerning for neoplasm. Correlation with nonemergent endoscopy is recommended in the near future to exclude neoplasm. 2. Morphologic changes in the liver most compatible with underlying cirrhosis, as discussed above. The possibility of metastatic disease to the liver is not excluded, but is not strongly favored on the basis of today's examination. This could be more definitively evaluated with followup nonemergent MRI of the abdomen with and without IV gadolinium if clinically appropriate. 3. 9 mm nonobstructive calculus in the lower pole collecting system of the right kidney. 4. Mild right hydroureter, without frank hydronephrosis. This is of uncertain etiology and significance, but could indicate mild distal ureteral stricture related to remote passage of a stone. 5. Atherosclerosis. Electronically Signed: By: Vinnie Langton M.D. On: 07/04/2015 00:53    Scheduled Inpatient Medications:   . amiodarone  200 mg Oral Daily  . apixaban  5 mg Oral BID  . atorvastatin  40 mg Oral q1800  . dicyclomine  10 mg Oral TID PC & HS  . feeding supplement  1 Container Oral TID WC  . insulin aspart  0-15 Units Subcutaneous TID WC  . insulin aspart  0-5 Units Subcutaneous QHS  . lactulose  20 g Oral Daily  . linagliptin  5 mg Oral Daily  . lisinopril  5 mg Oral  Daily  . metoprolol succinate  25 mg Oral Daily  . midodrine  10 mg Oral TID WC  . morphine  60 mg Oral BID  . pantoprazole  40 mg Oral BID    Continuous Inpatient Infusions:   . sodium chloride 100 mL/hr at 07/05/15 1129    PRN Inpatient Medications:  acetaminophen **OR** acetaminophen, hydrALAZINE, HYDROmorphone (DILAUDID) injection, ondansetron **OR** ondansetron (ZOFRAN) IV, oxyCODONE  Miscellaneous:   Assessment:  1. Nausea vomiting epigastric pain and abnormal CT scan. I  reviewed her last 3 CT scans with radiology today. They are in agreement that linings on this most recent CT scan are not new and nearly identical to that seen on 04/03/2015. It is of note that she had an EGD on 04/04/2015 without evidence of mass in the cardia distal esophagus or stomach however there is evidence of her fundoplication as well as multiple sutures in evidence as well. This likely accounts for the changes seen on CT scan. 2. Cirrhosis of the liver etiology uncertain. Patient with some amount of hyperammonemia  Plan:  1. We'll check for Helicobacter pylori 2. Continue twice a day Protonix will add Carafate. 3. Continue lactulose, change to by mouth from the enemas when clinically feasible. 4. Would consider head CT and neurology consult if her mental status does not improve. If her being on blood thinners there is concern for mental status changes. She has apparently had falls in the past and had a CT scan done on 05/27/2015 for fall, at that time showing no acute changes.  Lollie Sails MD 07/05/2015, 6:22 PM

## 2015-07-05 NOTE — Progress Notes (Signed)
[facility] NAME: Terissa Haffey    MR#:  [id number]  DATE OF BIRTH:  06/10/1956  SUBJECTIVE:  CHIEF COMPLAINT:  Patient is still nauseous but tolerating clear liquids.  jittery today Denies any vomiting.  Reporting epigastric pain 8 out of 10 , not seen by any oncologist lately. Yesterday evening patient was altered with elevated ammonia levels but today she is awake and alert answering questions  REVIEW OF SYSTEMS:  CONSTITUTIONAL: No fever, fatigue or weakness.  EYES: No blurred or double vision.  EARS, NOSE, AND THROAT: No tinnitus or ear pain.  RESPIRATORY: No cough, shortness of breath, wheezing or hemoptysis.  CARDIOVASCULAR: No chest pain, orthopnea, edema.  GASTROINTESTINAL:   reporting nausea,  deniesvomiting, diarrhea or reports epigastric  abdominal pain.  GENITOURINARY: No dysuria, hematuria.  ENDOCRINE: No polyuria, nocturia,  HEMATOLOGY: No anemia, easy bruising or bleeding SKIN: No rash or lesion. MUSCULOSKELETAL: Reporting bilateral elbow pains No joint pain or arthritis.   NEUROLOGIC: No tingling, numbness, weakness.  PSYCHIATRY: Patient is jittery but denies anxiety or depression.   DRUG ALLERGIES:   Allergies  Allergen Reactions  . Erythromycin Base Other (See Comments)    Reaction:  Fever   . Rosiglitazone Maleate Swelling  . Codeine Sulfate Rash  . Tetanus-Diphtheria Toxoids Td Rash and Other (See Comments)    Reaction:  Fever     VITALS:  Blood pressure 146/84, pulse 92, temperature 98.3 F (36.8 C), temperature source Oral, resp. rate 18, height 5' 3"  (1.6 m), weight 60.328 kg (133 lb), SpO2 96 %.  PHYSICAL EXAMINATION:  GENERAL:  59 y.o.-year-old patient lying in the bed with no acute distress.  EYES: Pupils equal, round, reactive to light and accommodation. No scleral icterus. Extraocular muscles intact.  HEENT: Head atraumatic, normocephalic. Oropharynx and nasopharynx clear.  NECK:   Supple, no jugular venous distention. No thyroid enlargement, no tenderness.  LUNGS: Normal breath sounds bilaterally, no wheezing, rales,rhonchi or crepitation. No use of accessory muscles of respiration.  CARDIOVASCULAR: S1, S2 normal. No murmurs, rubs, or gallops.  ABDOMEN: Soft, epigastric area is tender, nondistended. Bowel sounds present. No organomegaly or mass.  EXTREMITIES: No pedal edema, cyanosis, or clubbing.  bilateral elbow area is tender. NEUROLOGIC: Cranial nerves II through XII are intact. Muscle strength 5/5 in all extremities. Sensation intact. Gait not checked.  PSYCHIATRIC: The patient is alert and oriented x 3.  SKIN: No obvious rash, lesion, or ulcer.  bruises are noticed on her skin on her extremities  LABORATORY PANEL:   CBC  Recent Labs Lab 07/05/15 0724  WBC 11.7*  HGB 12.9  HCT 39.2  PLT 102*   ------------------------------------------------------------------------------------------------------------------  Chemistries   Recent Labs Lab 07/03/15 2147 07/05/15 0724  NA 135 143  K 3.1* 3.1*  CL 100* 111  CO2 20* 23  GLUCOSE 410* 247*  BUN 16 22*  CREATININE 0.86 0.87  CALCIUM 8.9 8.5*  AST 106*  --   ALT 91*  --   ALKPHOS 132*  --   BILITOT 3.3*  --    ------------------------------------------------------------------------------------------------------------------  Cardiac Enzymes No results for input(s): TROPONINI in the last 168 hours. ------------------------------------------------------------------------------------------------------------------  RADIOLOGY:  Dg Elbow 2 Views Right  07/04/2015  CLINICAL DATA:  Elbow pain, no known injury EXAM: RIGHT ELBOW - 2 VIEW COMPARISON:  None. FINDINGS: Two views of the right elbow submitted. No acute fracture or subluxation. No posterior fat pad sign. Exostosis or osteophyte is noted in distal right  humerus. IMPRESSION: Negative. Electronically Signed   By: Lahoma Crocker M.D.   On: 07/04/2015  15:00   Dg Elbow Complete Left  07/04/2015  CLINICAL DATA:  Elbow pain, no known injury, diabetes, history of uterine cancer EXAM: LEFT ELBOW - COMPLETE 3+ VIEW COMPARISON:  None. FINDINGS: Four views of left elbow submitted. No acute fracture or subluxation. No posterior fat pad sign. No pathologic calcifications are noted. IMPRESSION: Negative. Electronically Signed   By: Lahoma Crocker M.D.   On: 07/04/2015 14:59   Ct Abdomen Pelvis W Contrast  07/04/2015  CLINICAL DATA:  59 year old female with epigastric pain for the past 4 days. Nausea and vomiting. History of uterine cancer. EXAM: CT ABDOMEN AND PELVIS WITH CONTRAST TECHNIQUE: Multidetector CT imaging of the abdomen and pelvis was performed using the standard protocol following bolus administration of intravenous contrast. CONTRAST:  186m OMNIPAQUE IOHEXOL 300 MG/ML  SOLN COMPARISON:  CT the abdomen and pelvis 05/17/2015. FINDINGS: Lower chest:  Unremarkable. Hepatobiliary: The liver has a shrunken appearance and nodular contour, compatible with underlying cirrhosis. Multiple nodular appearing hypovascular areas on the initial postcontrast images which were obtained slightly early, favored to represent a background of widespread regenerative nodules. No discrete hypervascular hepatic lesion is confidently identified. This unusual pattern in the liver seems to normalize on the more delayed images in the visualized portions of the liver. No intra or extrahepatic biliary ductal dilatation. Status post cholecystectomy. Pancreas: No pancreatic mass. No pancreatic ductal dilatation. No pancreatic or peripancreatic fluid or inflammatory changes. Spleen: Unremarkable. Adrenals/Urinary Tract: Bilateral adrenal glands the left kidney are normal in appearance. Sub cm low-attenuation lesions in the right kidney are too small to definitively characterize, but are statistically likely to represent tiny cysts. 9 mm nonobstructive calculus in the lower pole collecting  system of the right kidney. Mild right hydroureter. No calculi are identified along the course of either ureter or within the lumen of the urinary bladder. Urinary bladder is moderately distended. Bilateral adrenal glands are normal in appearance. Stomach/Bowel: Profound thickening of the gastric wall immediately distal to the gastroesophageal junction, somewhat mass-like in appearance, best appreciated on image 17 of series 2 where this measures up to 7.1 x 3.6 cm. No pathologic dilatation of small bowel or colon. Vascular/Lymphatic: Atherosclerotic calcifications throughout the abdominal and pelvic vasculature, without evidence of aneurysm or dissection. No lymphadenopathy noted in the abdomen or pelvis. Reproductive: Status post hysterectomy. Ovaries are not confidently identified may be surgically absent or atrophic. Other: Small volume of ascites.  No pneumoperitoneum. Musculoskeletal: There are no aggressive appearing lytic or blastic lesions noted in the visualized portions of the skeleton. IMPRESSION: 1. Mass-like thickening of the proximal stomach highly concerning for neoplasm. Correlation with nonemergent endoscopy is recommended in the near future to exclude neoplasm. 2. Morphologic changes in the liver most compatible with underlying cirrhosis, as discussed above. The possibility of metastatic disease to the liver is not excluded, but is not strongly favored on the basis of today's examination. This could be more definitively evaluated with followup nonemergent MRI of the abdomen with and without IV gadolinium if clinically appropriate. 3. 9 mm nonobstructive calculus in the lower pole collecting system of the right kidney. 4. Mild right hydroureter, without frank hydronephrosis. This is of uncertain etiology and significance, but could indicate mild distal ureteral stricture related to remote passage of a stone. 5. Atherosclerosis. Electronically Signed   By: DVinnie LangtonM.D.   On: 07/04/2015  00:53    EKG:   Orders  placed or performed during the hospital encounter of 06/02/15  . EKG 12-Lead  . EKG 12-Lead  . ED EKG within 10 minutes  . ED EKG within 10 minutes  . EKG    ASSESSMENT AND PLAN:   58 year old Caucasian female history of type 2 diabetes non-insulin-requiring as was history of uterine cancer presenting with abdominal pain  1. Intractable nausea/vomiting: Still nauseous. Continue Supportive care IV fluid hydration and antiemetics 2. Concern for gastric neoplasm: Given history of uterine cancer lunch syndrome is a possibility, CT scan showing a thickening of the region of the GE junction and cardia of the stomach.   GI has reviewed previous CT scans and this was not apparent on a CT scan done 05/17/2015 however was apparent on a CT scan done 04/03/2015. GI will ask radiology to re-review these films.  Patient also with a history of cirrhosis of undetermined etiology.   patient underwent EGD on 04/04/2015 with endoscopy report clearly showing the fundoplication as well as some suture material and retroflex view. She is currently on Eliquis. This would need to be held prior to consideration of EGD.  Appreciate gastroenterology recommendations Consult oncology as well for establishment of care 3. Hyperlipidemia unspecified Lipitor 4. Paroxysmal atrial fibrillation: Amiodarone, and apixaban 5. Type 2 diabetes non-insulin-requiring: Hold oral agents at insulin sliding scale 6. Right and left elbow pain status post fall likely secondary to problem #1 with dehydration - x-rays of both elbows are negative F/u with PT consult  7. Venous thrombi embolism prophylactic: Therapeutic anticoagulants     All the records are reviewed and case discussed with Care Management/Social Workerr. Management plans discussed with the patient, family and they are in agreement.  CODE STATUS: Full code  TOTAL TIME TAKING CARE OF THIS PATIENT: 35 minutes.   POSSIBLE D/C IN 2-3 DAYS,  DEPENDING ON CLINICAL CONDITION.   Nicholes Mango M.D on 07/05/2015 at 2:42 PM  Between 7am to 6pm - Pager - [phone] After 6pm go to [email or web address] - password [email or web address] [facility]  [facility]  Office  [phone]  CC: Primary care physician; Sheral Flow, NP

## 2015-07-05 NOTE — Plan of Care (Signed)
Problem: Discharge Progression Outcomes Goal: Other Discharge Outcomes/Goals Outcome: Progressing Plan of Care Progress to Goal:   Pt is lethargic at times. Pt has gotten lactulose enema during shift. Pt has rested comfortably during shift. No other signs of distress noted. Will continue to monitor.

## 2015-07-05 NOTE — Progress Notes (Signed)
Physical Therapy Treatment Patient Details Name: [name] MRN: [id number] DOB: 1956/06/23 Today's Date: 07/05/2015    History of Present Illness Pt admitted with intractable nausea and vomiting as well as concern for gastric neoplasm. Pt also complaining of R and L elbow pain secondary to recent fall but radiographs were negative for fracture. Pt with history of frequent falls at home. PMH - syncope, HTN, chronic pain, afib, HTN, uterine CA. Pt is somewhat confused today and RN is aware and has spoken with GI who ordered a stat ammonia on her. Family reports 5 falls in the last 12 months and 3 in the last "couple months." Pt has 24/7 assistance at home from husband and sons. She ambulates with hand held assist and infrequent use of rolling walker    PT Comments    Pt quite agitated and preoccupied with scratching at self. Pt does not participate in productive communication/conversation. Pt demonstrates extremely poor safety with transfer/ambulation making further ambulation unsafe at this time. Pt impulsive. Pt in chair comfortably; strongly encouraged to remain in chair and use call bell for assist to get up. Continue PT as to progress safety with functional mobility.  Follow Up Recommendations  SNF     Equipment Recommendations  None recommended by PT    Recommendations for Other Services       Precautions / Restrictions Precautions Precautions: Fall Restrictions Weight Bearing Restrictions: No    Mobility  Bed Mobility Overal bed mobility: Needs Assistance Bed Mobility: Supine to Sit     Supine to sit: Supervision Sit to supine:  (mostly for safety)      Transfers Overall transfer level: Needs assistance Equipment used: Rolling walker (2 wheeled) Transfers: Sit to/from Stand Sit to Stand: Min assist         General transfer comment: Poor safety and general awareness of safety  Ambulation/Gait Ambulation/Gait assistance: Min guard;+2  safety/equipment Ambulation Distance (Feet): 5 Feet Assistive device: Rolling walker (2 wheeled) Gait Pattern/deviations:  (bed to chair)     General Gait Details: Extremely poor safety; lets go of rw, impulsively bends knees; did not feel like buckling, but more purposeful before reaching chair. Distracted by scratching body/head/grabbing at therapist. Once in chair pt impulsively stand up again with attempt to walk and bends knees again. Pt returned safely to chair.    Stairs            Wheelchair Mobility    Modified Rankin (Stroke Patients Only)       Balance                                    Cognition Arousal/Alertness: Awake/alert Behavior During Therapy: Restless;Agitated;Impulsive Overall Cognitive Status: Impaired/Different from baseline Area of Impairment: Attention;Following commands;Safety/judgement;Awareness Orientation Level: Situation;Time;Place Current Attention Level:  (little to no attention)   Following Commands: Follows one step commands consistently (with difficulty) Safety/Judgement: Decreased awareness of safety;Decreased awareness of deficits     General Comments: Pt does not really participate in conversation; repeats "okay" to everything. rolls eyes back in her head at one point for several seconds than sits up and say "okay"    Exercises Other Exercises Other Exercises: Exercises attempted; pt unable to focus on task.     General Comments        Pertinent Vitals/Pain Pain Assessment:  (Reports pain but does not say where/how much)    Home Living  Prior Function            PT Goals (current goals can now be found in the care plan section) Progress towards PT goals: Progressing toward goals    Frequency  Min 2X/week    PT Plan Current plan remains appropriate    Co-evaluation             End of Session Equipment Utilized During Treatment: Gait belt         Time:  0712-1975 PT Time Calculation (min) (ACUTE ONLY): 17 min  Charges:  $Gait Training: 8-22 mins                    G Codes:      Charlaine Dalton 07/05/2015, 3:48 PM

## 2015-07-05 NOTE — Plan of Care (Signed)
Problem: Discharge Progression Outcomes Goal: Other Discharge Outcomes/Goals Outcome: Progressing Plan of care progress to goal:  Patient c/o abdominal pain once, PRN pain medication given with relief. Patient lethargic at times. Ammonia 34. IV fluids infusing as ordered. Potassium replacement given. Resting quietly at this time.

## 2015-07-05 NOTE — Progress Notes (Signed)
PT Cancellation Note  Patient Details Name: SHAZIA MITCHENER MRN: [id number] DOB: 12-31-1955   Cancelled Treatment:    Reason Eval/Treat Not Completed: Other (comment). Treatment attempted this a.m; pt busy with nursing. Will re attempt later today as the schedule allows.   Erline Levine Bishop 07/05/2015, 11:37 AM

## 2015-07-06 ENCOUNTER — Observation Stay: Payer: Commercial Managed Care - HMO

## 2015-07-06 ENCOUNTER — Ambulatory Visit: Payer: Commercial Managed Care - HMO | Admitting: Primary Care

## 2015-07-06 ENCOUNTER — Inpatient Hospital Stay: Payer: Commercial Managed Care - HMO

## 2015-07-06 DIAGNOSIS — A047 Enterocolitis due to Clostridium difficile: Secondary | ICD-10-CM | POA: Diagnosis present

## 2015-07-06 DIAGNOSIS — A084 Viral intestinal infection, unspecified: Secondary | ICD-10-CM | POA: Diagnosis present

## 2015-07-06 DIAGNOSIS — I10 Essential (primary) hypertension: Secondary | ICD-10-CM | POA: Diagnosis present

## 2015-07-06 DIAGNOSIS — K7460 Unspecified cirrhosis of liver: Secondary | ICD-10-CM | POA: Diagnosis present

## 2015-07-06 DIAGNOSIS — E039 Hypothyroidism, unspecified: Secondary | ICD-10-CM

## 2015-07-06 DIAGNOSIS — K5792 Diverticulitis of intestine, part unspecified, without perforation or abscess without bleeding: Principal | ICD-10-CM

## 2015-07-06 DIAGNOSIS — R197 Diarrhea, unspecified: Secondary | ICD-10-CM | POA: Diagnosis not present

## 2015-07-06 DIAGNOSIS — E785 Hyperlipidemia, unspecified: Secondary | ICD-10-CM | POA: Diagnosis present

## 2015-07-06 DIAGNOSIS — K922 Gastrointestinal hemorrhage, unspecified: Secondary | ICD-10-CM | POA: Diagnosis present

## 2015-07-06 DIAGNOSIS — F419 Anxiety disorder, unspecified: Secondary | ICD-10-CM | POA: Diagnosis present

## 2015-07-06 DIAGNOSIS — R4182 Altered mental status, unspecified: Secondary | ICD-10-CM | POA: Diagnosis not present

## 2015-07-06 DIAGNOSIS — R112 Nausea with vomiting, unspecified: Secondary | ICD-10-CM

## 2015-07-06 DIAGNOSIS — Z8673 Personal history of transient ischemic attack (TIA), and cerebral infarction without residual deficits: Secondary | ICD-10-CM | POA: Diagnosis not present

## 2015-07-06 DIAGNOSIS — K219 Gastro-esophageal reflux disease without esophagitis: Secondary | ICD-10-CM

## 2015-07-06 DIAGNOSIS — Z79899 Other long term (current) drug therapy: Secondary | ICD-10-CM

## 2015-07-06 DIAGNOSIS — I4891 Unspecified atrial fibrillation: Secondary | ICD-10-CM

## 2015-07-06 DIAGNOSIS — I480 Paroxysmal atrial fibrillation: Secondary | ICD-10-CM | POA: Diagnosis present

## 2015-07-06 DIAGNOSIS — D696 Thrombocytopenia, unspecified: Secondary | ICD-10-CM

## 2015-07-06 DIAGNOSIS — R1902 Left upper quadrant abdominal swelling, mass and lump: Secondary | ICD-10-CM | POA: Diagnosis not present

## 2015-07-06 DIAGNOSIS — Z794 Long term (current) use of insulin: Secondary | ICD-10-CM

## 2015-07-06 DIAGNOSIS — N134 Hydroureter: Secondary | ICD-10-CM | POA: Diagnosis present

## 2015-07-06 DIAGNOSIS — M25522 Pain in left elbow: Secondary | ICD-10-CM | POA: Diagnosis present

## 2015-07-06 DIAGNOSIS — E860 Dehydration: Secondary | ICD-10-CM | POA: Diagnosis present

## 2015-07-06 DIAGNOSIS — Z881 Allergy status to other antibiotic agents status: Secondary | ICD-10-CM | POA: Diagnosis not present

## 2015-07-06 DIAGNOSIS — D72829 Elevated white blood cell count, unspecified: Secondary | ICD-10-CM

## 2015-07-06 DIAGNOSIS — Z7901 Long term (current) use of anticoagulants: Secondary | ICD-10-CM | POA: Diagnosis not present

## 2015-07-06 DIAGNOSIS — Z8542 Personal history of malignant neoplasm of other parts of uterus: Secondary | ICD-10-CM

## 2015-07-06 DIAGNOSIS — R55 Syncope and collapse: Secondary | ICD-10-CM

## 2015-07-06 DIAGNOSIS — E108 Type 1 diabetes mellitus with unspecified complications: Secondary | ICD-10-CM

## 2015-07-06 DIAGNOSIS — G9341 Metabolic encephalopathy: Secondary | ICD-10-CM | POA: Diagnosis present

## 2015-07-06 DIAGNOSIS — E440 Moderate protein-calorie malnutrition: Secondary | ICD-10-CM | POA: Diagnosis present

## 2015-07-06 DIAGNOSIS — M25521 Pain in right elbow: Secondary | ICD-10-CM | POA: Diagnosis present

## 2015-07-06 DIAGNOSIS — Z887 Allergy status to serum and vaccine status: Secondary | ICD-10-CM | POA: Diagnosis not present

## 2015-07-06 DIAGNOSIS — N200 Calculus of kidney: Secondary | ICD-10-CM | POA: Diagnosis present

## 2015-07-06 DIAGNOSIS — E119 Type 2 diabetes mellitus without complications: Secondary | ICD-10-CM | POA: Diagnosis present

## 2015-07-06 DIAGNOSIS — Z8711 Personal history of peptic ulcer disease: Secondary | ICD-10-CM

## 2015-07-06 LAB — C DIFFICILE QUICK SCREEN W PCR REFLEX
C Diff antigen: POSITIVE — AB
C Diff toxin: NEGATIVE

## 2015-07-06 LAB — URINALYSIS COMPLETE WITH MICROSCOPIC (ARMC ONLY)
BACTERIA UA: NONE SEEN
Bilirubin Urine: NEGATIVE
Glucose, UA: >500 mg/dL — AB
LEUKOCYTES UA: NEGATIVE
Nitrite: NEGATIVE
PH: 5 (ref 5.0–8.0)
PROTEIN: 30 mg/dL — AB
SPECIFIC GRAVITY, URINE: 1.024 (ref 1.005–1.030)

## 2015-07-06 LAB — CLOSTRIDIUM DIFFICILE BY PCR

## 2015-07-06 LAB — BASIC METABOLIC PANEL
ANION GAP: 8 (ref 5–15)
Anion gap: 11 (ref 5–15)
BUN: 21 mg/dL — ABNORMAL HIGH (ref 6–20)
BUN: 23 mg/dL — ABNORMAL HIGH (ref 6–20)
CALCIUM: 8.5 mg/dL — AB (ref 8.9–10.3)
CALCIUM: 8.4 mg/dL — AB (ref 8.9–10.3)
CO2: 19 mmol/L — ABNORMAL LOW (ref 22–32)
CO2: 20 mmol/L — ABNORMAL LOW (ref 22–32)
CREATININE: 0.94 mg/dL (ref 0.44–1.00)
Chloride: 109 mmol/L (ref 101–111)
Chloride: 110 mmol/L (ref 101–111)
Creatinine, Ser: 0.97 mg/dL (ref 0.44–1.00)
GFR calc non Af Amer: >60 mL/min (ref >60)
GLUCOSE: 284 mg/dL — AB (ref 65–99)
Glucose, Bld: 295 mg/dL — ABNORMAL HIGH (ref 65–99)
Potassium: 2.9 mmol/L — CL (ref 3.5–5.1)
Potassium: 3.7 mmol/L (ref 3.5–5.1)
Sodium: 139 mmol/L (ref 135–145)
Sodium: 138 mmol/L (ref 135–145)

## 2015-07-06 LAB — CBC
HEMATOCRIT: 43.6 % (ref 35.0–47.0)
HEMATOCRIT: 42.6 % (ref 35.0–47.0)
HEMOGLOBIN: 14.4 g/dL (ref 12.0–16.0)
Hemoglobin: 14 g/dL (ref 12.0–16.0)
MCH: 32.6 pg (ref 26.0–34.0)
MCH: 31.9 pg (ref 26.0–34.0)
MCHC: 33 g/dL (ref 32.0–36.0)
MCHC: 32.9 g/dL (ref 32.0–36.0)
MCV: 98.8 fL (ref 80.0–100.0)
MCV: 97 fL (ref 80.0–100.0)
PLATELETS: 108 10*3/uL — AB (ref 150–440)
Platelets: 118 10*3/uL — ABNORMAL LOW (ref 150–440)
RBC: 4.41 MIL/uL (ref 3.80–5.20)
RBC: 4.39 MIL/uL (ref 3.80–5.20)
RDW: 16.5 % — ABNORMAL HIGH (ref 11.5–14.5)
RDW: 15.9 % — AB (ref 11.5–14.5)
WBC: 20.8 10*3/uL — ABNORMAL HIGH (ref 3.6–11.0)
WBC: 22.7 10*3/uL — AB (ref 3.6–11.0)

## 2015-07-06 LAB — HEPATIC FUNCTION PANEL
ALT: 63 U/L — AB (ref 14–54)
AST: 47 U/L — AB (ref 15–41)
Albumin: 3 g/dL — ABNORMAL LOW (ref 3.5–5.0)
Alkaline Phosphatase: 99 U/L (ref 38–126)
BILIRUBIN DIRECT: 0.3 mg/dL (ref 0.1–0.5)
Indirect Bilirubin: 0.7 mg/dL (ref 0.3–0.9)
Total Bilirubin: 1 mg/dL (ref 0.3–1.2)
Total Protein: 5.4 g/dL — ABNORMAL LOW (ref 6.5–8.1)

## 2015-07-06 LAB — GLUCOSE, CAPILLARY
Glucose-Capillary: 286 mg/dL — ABNORMAL HIGH (ref 65–99)
Glucose-Capillary: 236 mg/dL — ABNORMAL HIGH (ref 65–99)
Glucose-Capillary: 296 mg/dL — ABNORMAL HIGH (ref 65–99)
Glucose-Capillary: 254 mg/dL — ABNORMAL HIGH (ref 65–99)

## 2015-07-06 LAB — AMMONIA: Ammonia: 41 umol/L — ABNORMAL HIGH (ref 9–35)

## 2015-07-06 LAB — MAGNESIUM: Magnesium: 1.6 mg/dL — ABNORMAL LOW (ref 1.7–2.4)

## 2015-07-06 MED ORDER — MAGNESIUM SULFATE 2 GM/50ML IV SOLN
2.0000 g | Freq: Once | INTRAVENOUS | Status: AC
  Administered 2015-07-06: 10:00:00 2 g via INTRAVENOUS
  Filled 2015-07-06: qty 50

## 2015-07-06 MED ORDER — DEXTROSE 5 % IV SOLN
1.0000 g | INTRAVENOUS | Status: DC
  Administered 2015-07-06: 14:00:00 1 g via INTRAVENOUS
  Filled 2015-07-06 (×2): qty 10

## 2015-07-06 MED ORDER — PIPERACILLIN-TAZOBACTAM 4.5 G IVPB
4.5000 g | Freq: Three times a day (TID) | INTRAVENOUS | Status: DC
  Administered 2015-07-06 – 2015-07-08 (×6): 4.5 g via INTRAVENOUS
  Filled 2015-07-06 (×8): qty 100

## 2015-07-06 MED ORDER — SODIUM CHLORIDE 0.9 % IV BOLUS (SEPSIS): 1000.0000 mL | Freq: Once | INTRAVENOUS | Status: DC

## 2015-07-06 MED ORDER — VANCOMYCIN HCL IN DEXTROSE 750-5 MG/150ML-% IV SOLN
750.0000 mg | INTRAVENOUS | Status: DC
  Administered 2015-07-07 (×2): 750 mg via INTRAVENOUS
  Filled 2015-07-06 (×3): qty 150

## 2015-07-06 MED ORDER — INSULIN DETEMIR 100 UNIT/ML ~~LOC~~ SOLN
5.0000 [IU] | Freq: Every day | SUBCUTANEOUS | Status: DC
  Administered 2015-07-06 – 2015-07-07 (×2): 5 [IU] via SUBCUTANEOUS
  Filled 2015-07-06 (×2): qty 0.05

## 2015-07-06 MED ORDER — POTASSIUM CHLORIDE 10 MEQ/100ML IV SOLN
10.0000 meq | INTRAVENOUS | Status: AC
  Administered 2015-07-06 (×4): 10 meq via INTRAVENOUS
  Filled 2015-07-06 (×4): qty 100

## 2015-07-06 MED ORDER — POTASSIUM CHLORIDE CRYS ER 20 MEQ PO TBCR
40.0000 meq | EXTENDED_RELEASE_TABLET | Freq: Two times a day (BID) | ORAL | Status: DC
  Administered 2015-07-06 – 2015-07-11 (×10): 40 meq via ORAL
  Filled 2015-07-06 (×11): qty 2

## 2015-07-06 MED ORDER — VANCOMYCIN HCL IN DEXTROSE 1-5 GM/200ML-% IV SOLN
1000.0000 mg | INTRAVENOUS | Status: AC
  Administered 2015-07-06: 1000 mg via INTRAVENOUS
  Filled 2015-07-06: qty 200

## 2015-07-06 MED ORDER — CITALOPRAM HYDROBROMIDE 20 MG PO TABS
20.0000 mg | ORAL_TABLET | Freq: Every day | ORAL | Status: DC
  Administered 2015-07-06 – 2015-07-11 (×6): 20 mg via ORAL
  Filled 2015-07-06 (×6): qty 1

## 2015-07-06 NOTE — Progress Notes (Signed)
[place] [facility] [place] NAME: [name]    MR#:  [id number]  DATE OF BIRTH:  [birth year]  SUBJECTIVE:  CHIEF COMPLAINT:  Altered this morning. Disoriented. States she has continued abdominal pain.  REVIEW OF SYSTEMS:   Unable to obtain due to altered mental status  DRUG ALLERGIES:   Allergies  Allergen Reactions  . Erythromycin Base Other (See Comments)    Reaction:  Fever   . Rosiglitazone Maleate Swelling  . Codeine Sulfate Rash  . Tetanus-Diphtheria Toxoids Td Rash and Other (See Comments)    Reaction:  Fever     VITALS:  Blood pressure 128/65, pulse 72, temperature 98.2 F (36.8 C), temperature source Oral, resp. rate 18, height 5' 3"  (1.6 m), weight 60.328 kg (133 lb), SpO2 98 %.  PHYSICAL EXAMINATION:  GENERAL:  59 y.o.-year-old patient lying in the bed with no acute distress.  EYES: Pupils equal, round, reactive to light and accommodation. No scleral icterus. Extraocular muscles intact.  HEENT: Head atraumatic, normocephalic. Oropharynx and nasopharynx clear. Make his membranes are dry NECK:  Supple, no jugular venous distention. No thyroid enlargement, no tenderness.  LUNGS: Normal breath sounds bilaterally, no wheezing, rales,rhonchi or crepitation. No use of accessory muscles of respiration.  CARDIOVASCULAR: S1, S2 normal. No murmurs, rubs, or gallops. Tachycardic, irregular ABDOMEN: Guarding, very tender throughout, nondistended, bowel sounds are decreased, no organomegaly or mass noted EXTREMITIES: No pedal edema, cyanosis, or clubbing.   NEUROLOGIC: Cranial nerves II through XII are intact. Muscle strength 4 /5 in all extremities. Very slow to answer questions and follow commands.  PSYCHIATRIC: The patient is alert but disoriented and with psychomotor retardation SKIN: No obvious rash, lesion, or ulcer.  bruises are noticed on her skin on her extremities  LABORATORY PANEL:   CBC  Recent Labs Lab  07/05/15 0724  WBC 11.7*  HGB 12.9  HCT 39.2  PLT 102*   ------------------------------------------------------------------------------------------------------------------  Chemistries   Recent Labs Lab 07/03/15 2147  07/06/15 0414  NA 135  < > 139  K 3.1*  < > 2.9*  CL 100*  < > 109  CO2 20*  < > 19*  GLUCOSE 410*  < > 284*  BUN 16  < > 21*  CREATININE 0.86  < > 0.94  CALCIUM 8.9  < > 8.5*  MG  --   --  1.6*  AST 106*  --   --   ALT 91*  --   --   ALKPHOS 132*  --   --   BILITOT 3.3*  --   --   < > = values in this interval not displayed. ------------------------------------------------------------------------------------------------------------------  Cardiac Enzymes No results for input(s): TROPONINI in the last 168 hours. ------------------------------------------------------------------------------------------------------------------  RADIOLOGY:  Dg Elbow 2 Views Right  07/04/2015  CLINICAL DATA:  Elbow pain, no known injury EXAM: RIGHT ELBOW - 2 VIEW COMPARISON:  None. FINDINGS: Two views of the right elbow submitted. No acute fracture or subluxation. No posterior fat pad sign. Exostosis or osteophyte is noted in distal right humerus. IMPRESSION: Negative. Electronically Signed   By: Lahoma Crocker M.D.   On: 07/04/2015 15:00   Dg Elbow Complete Left  07/04/2015  CLINICAL DATA:  Elbow pain, no known injury, diabetes, history of uterine cancer EXAM: LEFT ELBOW - COMPLETE 3+ VIEW COMPARISON:  None. FINDINGS: Four views of left elbow submitted. No acute fracture or subluxation. No posterior fat pad sign. No pathologic calcifications are noted. IMPRESSION: Negative. Electronically  Signed   By: Lahoma Crocker M.D.   On: 07/04/2015 14:59    EKG:   Orders placed or performed during the hospital encounter of 06/02/15  . EKG 12-Lead  . EKG 12-Lead  . ED EKG within 10 minutes  . ED EKG within 10 minutes  . EKG    ASSESSMENT AND PLAN:   59 year old Caucasian female history  of type 2 diabetes non-insulin-requiring as was history of uterine cancer presenting with abdominal pain  1. Intractable nausea/vomiting:  - Seems resolved, that she is having abdominal pain today may be related. She has a history of gastritis. Continue PPI  2. Concern for gastric neoplasm:  - History of uterine cancer - CT scan showing a thickening of GE junction and cardia of the stomach. - GI following, no EGD at this time, will need to hold elequis prior to any procedure - Oncology consultation is pending    #3 cirrhosis - Etiology unclear - Has had elevated ammonia levels during this admission. Continue lactulose - Initial transaminases elevated, hold statin  4. Paroxysmal atrial fibrillation:  - Currently A. fib with RVR. Continue with metoprolol, Amiodarone, and apixaban - Initiate telemetry monitoring  5. Type 2 diabetes non-insulin-requiring:  - Hold oral agents at insulin sliding scale  6. Right and left elbow pain status post fall likely secondary to problem #1 with dehydration - x-rays of both elbows are negative F/u with PT consult   7. Altered mental status - On review of this chart her mental status has fluctuated during this admission. Will get stat labs today including an ammonia level. Also check CT of the head  #8 bright red blood per rectum - Seen overnight, with new abdominal pain this is quite concerning. Check CBC. Continue PPI. Hold eliquis.  #9 hypokalemia: Replace potassium and also magnesium  CODE STATUS: Full code  TOTAL TIME TAKING CARE OF THIS PATIENT: 35 minutes. Care plan discussed with the patient and with nursing. I have attempted to contact her husband but there is no answer.  POSSIBLE D/C IN 2-3 DAYS, DEPENDING ON CLINICAL CONDITION.   Myrtis Ser M.D on 07/06/2015 at 8:15 AM  Between 7am to 6pm - Pager - [phone] After 6pm go to [email or web address] - password [email or web address]  [facility]  Office   [phone]  CC: Primary care physician; Sheral Flow, NP

## 2015-07-06 NOTE — Progress Notes (Addendum)
PT Cancellation Note  Patient Details Name: [name] MRN: [id number] DOB: [birth year]   Cancelled Treatment:    Reason Eval/Treat Not Completed: Patient at procedure or test/unavailable (pt out of room at CT scan. Pt also lethargic/unresponsive with A-fib this morning. Hold off on treatment per RN. Will re-attempt at later time/date.)   Milon Score 07/06/2015, 8:53 AM   This patient note, response to treatment and overall treatment plan has been reviewed and this clinician agrees with the information provided.  Etheridge Geil H. Owens Shark, PT, DPT, NCS 07/06/2015, 9:23 AM [phone]

## 2015-07-06 NOTE — Consult Note (Signed)
[place]  [phone] [phone]) [phone]  ID: [name] OB: February 09, 1956  MR#: [id number]  OVF#:[id number]  Patient Care Team: [name], NP as PCP - General (Nurse Practitioner)  CHIEF COMPLAINT:  Chief Complaint  Patient presents with  . Nausea  . Emesis  . Diarrhea    INTERVAL HISTORY: Patient is a 59 year old female who was initially admitted to the hospital with complaints of intractable nausea and vomiting as well as epigastric pain. Workup included CT scan which revealed proximal masslike gastric thickening concerning for underlying malignancy. Throughout patient's hospital admission she has had a variable mental status. Currently patient is alert, but she is not answering any questions. Review of systems is unobtainable.  REVIEW OF SYSTEMS:   Review of Systems  Unable to perform ROS: medical condition    As per HPI. Otherwise, a complete review of systems is negatve.  PAST MEDICAL HISTORY: Past Medical History  Diagnosis Date  . Type 1 diabetes ([place])   . Hypothyroid   . Degenerative disk disease   . Stomach ulcer   . Diverticulitis   . Syncope 01/2015  . Anxiety   . GERD (gastroesophageal reflux disease)   . History of hiatal hernia   . Cancer (HCC)     HX OF CANCER OF UTERUS   . TIA (transient ischemic attack) 02/2015  . Hypertension   . Pancreatitis   . PAF (paroxysmal atrial fibrillation) ([place]) 03/2015    a. new onset 03/2015 in setting of intractable N/V; b. on Eliquis 5 mg bid; c. CHADSVASc 4 (DM, TIA x 2, female)  . Intussusception intestine ([place]) 05/2015    PAST SURGICAL HISTORY: Past Surgical History  Procedure Laterality Date  . Hernia repair    . Abdominal hysterectomy    . Cholecystectomy    . Esophagogastroduodenoscopy N/A 04/04/2015    Procedure: ESOPHAGOGASTRODUODENOSCOPY (EGD);  Surgeon: Hulen Luster, MD;  Location[facility] ENDOSCOPY;  Service: Endoscopy;  Laterality: N/A;    FAMILY HISTORY Family  History  Problem Relation Age of Onset  . Hypertension Mother   . CAD Sister        ADVANCED DIRECTIVES:    HEALTH MAINTENANCE: Social History  Substance Use Topics  . Smoking status: Never Smoker   . Smokeless tobacco: Never Used  . Alcohol Use: No     Colonoscopy:  PAP:  Bone density:  Lipid panel:  Allergies  Allergen Reactions  . Erythromycin Base Other (See Comments)    Reaction:  Fever   . Rosiglitazone Maleate Swelling  . Codeine Sulfate Rash  . Tetanus-Diphtheria Toxoids Td Rash and Other (See Comments)    Reaction:  Fever     Current Facility-Administered Medications  Medication Dose Route Frequency Provider Last Rate Last Dose  . 0.9 %  sodium chloride infusion   Intravenous Continuous Lytle Butte, MD 100 mL/hr at 07/05/15 2333    . acetaminophen (TYLENOL) tablet 650 mg  650 mg Oral Q6H PRN Lytle Butte, MD   650 mg at 07/05/15 8416   Or  . acetaminophen (TYLENOL) suppository 650 mg  650 mg Rectal Q6H PRN Lytle Butte, MD      . amiodarone (PACERONE) tablet 200 mg  200 mg Oral Daily Lytle Butte, MD   200 mg at 07/06/15 6063  . cefTRIAXone (ROCEPHIN) 1 g in dextrose 5 % 50 mL IVPB  1 g Intravenous Q24H Aldean Jewett, MD      . citalopram (CELEXA) tablet 20  mg  20 mg Oral Daily Aldean Jewett, MD      . dicyclomine (BENTYL) capsule 10 mg  10 mg Oral TID PC & HS Lytle Butte, MD   10 mg at 07/05/15 2227  . feeding supplement (BOOST / RESOURCE BREEZE) liquid 1 Container  1 Container Oral TID WC Nicholes Mango, MD   1 Container at 07/06/15 1200  . hydrALAZINE (APRESOLINE) injection 10 mg  10 mg Intravenous Q4H PRN Lytle Butte, MD   10 mg at 07/04/15 0405  . HYDROmorphone (DILAUDID) injection 0.5 mg  0.5 mg Intravenous Q4H PRN Lytle Butte, MD   0.5 mg at 07/04/15 2094  . insulin aspart (novoLOG) injection 0-15 Units  0-15 Units Subcutaneous TID WC Nicholes Mango, MD   5 Units at 07/06/15 1155  . insulin aspart (novoLOG) injection 0-5 Units  0-5 Units  Subcutaneous QHS Nicholes Mango, MD   2 Units at 07/04/15 2349  . insulin detemir (LEVEMIR) injection 5 Units  5 Units Subcutaneous Daily Aldean Jewett, MD      . lactulose (CHRONULAC) 10 GM/15ML solution 20 g  20 g Oral Daily Nicholes Mango, MD   20 g at 07/06/15 1043  . lisinopril (PRINIVIL,ZESTRIL) tablet 5 mg  5 mg Oral Daily Lytle Butte, MD   5 mg at 07/05/15 7096  . metoprolol succinate (TOPROL-XL) 24 hr tablet 25 mg  25 mg Oral Daily Lytle Butte, MD   25 mg at 07/06/15 0755  . midodrine (PROAMATINE) tablet 10 mg  10 mg Oral TID WC Lytle Butte, MD   10 mg at 07/06/15 1043  . morphine (MS CONTIN) 12 hr tablet 60 mg  60 mg Oral BID Lytle Butte, MD   60 mg at 07/06/15 1043  . ondansetron (ZOFRAN) tablet 4 mg  4 mg Oral Q6H PRN Lytle Butte, MD       Or  . ondansetron [facility]) injection 4 mg  4 mg Intravenous Q6H PRN Lytle Butte, MD   4 mg at 07/04/15 1013  . oxyCODONE (Oxy IR/ROXICODONE) immediate release tablet 5 mg  5 mg Oral Q4H PRN Lytle Butte, MD      . pantoprazole (PROTONIX) EC tablet 40 mg  40 mg Oral BID Lytle Butte, MD   40 mg at 07/06/15 1154  . potassium chloride SA (K-DUR,KLOR-CON) CR tablet 40 mEq  40 mEq Oral BID Harrie Foreman, MD   40 mEq at 07/06/15 0636    OBJECTIVE: Filed Vitals:   07/06/15 1259  BP: 128/78  Pulse: 106  Temp: 97.5 F (36.4 C)  Resp: 20     Body mass index is 23.57 kg/(m^2).    ECOG FS:3 - Symptomatic, >50% confined to bed  General: Well-developed, well-nourished, no acute distress. Eyes: Pink conjunctiva, anicteric sclera. HEENT: Normocephalic, moist mucous membranes, clear oropharnyx. Lungs: Clear to auscultation bilaterally. Heart: Regular rate and rhythm. No rubs, murmurs, or gallops. Abdomen: Soft, nontender, nondistended. No organomegaly noted, normoactive bowel sounds. Musculoskeletal: No edema, cyanosis, or clubbing. Neuro: Alert, answering all questions appropriately. Cranial nerves grossly intact. Skin: No rashes or  petechiae noted. Psych: Normal affect. Lymphatics: No cervical, calvicular, axillary or inguinal LAD.   LAB RESULTS:  Lab Results  Component Value Date   NA 139 07/06/2015   K 2.9* 07/06/2015   CL 109 07/06/2015   CO2 19* 07/06/2015   GLUCOSE 284* 07/06/2015   BUN 21* 07/06/2015   CREATININE 0.94 07/06/2015  CALCIUM 8.5* 07/06/2015   PROT 5.4* 07/06/2015   ALBUMIN 3.0* 07/06/2015   AST 47* 07/06/2015   ALT 63* 07/06/2015   ALKPHOS 99 07/06/2015   BILITOT 1.0 07/06/2015   GFRNONAA >60 07/06/2015   GFRAA >60 07/06/2015    Lab Results  Component Value Date   WBC 20.8* 07/06/2015   NEUTROABS 6.9 05/27/2015   HGB 14.4 07/06/2015   HCT 43.6 07/06/2015   MCV 98.8 07/06/2015   PLT 118* 07/06/2015     STUDIES: Dg Elbow 2 Views Right  2015/07/06  CLINICAL DATA:  Elbow pain, no known injury EXAM: RIGHT ELBOW - 2 VIEW COMPARISON:  None. FINDINGS: Two views of the right elbow submitted. No acute fracture or subluxation. No posterior fat pad sign. Exostosis or osteophyte is noted in distal right humerus. IMPRESSION: Negative. Electronically Signed   By: Lahoma Crocker M.D.   On: 07-06-2015 15:00   Dg Elbow Complete Left  07/06/2015  CLINICAL DATA:  Elbow pain, no known injury, diabetes, history of uterine cancer EXAM: LEFT ELBOW - COMPLETE 3+ VIEW COMPARISON:  None. FINDINGS: Four views of left elbow submitted. No acute fracture or subluxation. No posterior fat pad sign. No pathologic calcifications are noted. IMPRESSION: Negative. Electronically Signed   By: Lahoma Crocker M.D.   On: Jul 06, 2015 14:59   Ct Head Wo Contrast  07/06/2015  CLINICAL DATA:  Altered mental status. EXAM: CT HEAD WITHOUT CONTRAST TECHNIQUE: Contiguous axial images were obtained from the base of the skull through the vertex without intravenous contrast. COMPARISON:  CT scan of May 27, 2015. FINDINGS: Bony calvarium appears intact. Mild chronic ischemic white matter disease is noted. No mass effect or midline  shift is noted. Ventricular size is within normal limits. There is no evidence of mass lesion, hemorrhage or acute infarction. IMPRESSION: Mild chronic ischemic white matter disease. No acute intracranial abnormality seen. Electronically Signed   By: Marijo Conception, M.D.   On: 07/06/2015 09:27   Ct Abdomen Pelvis W Contrast  07/05/2015  ADDENDUM REPORT: 07/05/2015 17:10 ADDENDUM: The original report was by Dr. Madie Reno. The following addendum is by Dr. Van Clines: I received a call requesting comparison to the 04/03/2015 exam with regard to the masslike thickening in the gastroesophageal junction and stomach cardia. The appearance in this area on 07/03/2015 is very similar to the 04/03/2015 exam. By report, the patient had an endoscopy on 04/04/2015 with careful assessment of the stomach cardia and gastroesophageal junction. At the endoscopy, fundoplication was revealed along with gastritis but there was no mass. Given the similarity of the appearance on 07/03/2015 and 04/03/2015, this region has thus already been assessed by endoscopy. Accordingly, further endoscopy is not felt to be mandatory and in fact may be duplicative. Electronically Signed   By: Van Clines M.D.   On: 07/05/2015 17:10  07/05/2015  CLINICAL DATA:  59 year old female with epigastric pain for the past 4 days. Nausea and vomiting. History of uterine cancer. EXAM: CT ABDOMEN AND PELVIS WITH CONTRAST TECHNIQUE: Multidetector CT imaging of the abdomen and pelvis was performed using the standard protocol following bolus administration of intravenous contrast. CONTRAST:  124m OMNIPAQUE IOHEXOL 300 MG/ML  SOLN COMPARISON:  CT the abdomen and pelvis 05/17/2015. FINDINGS: Lower chest:  Unremarkable. Hepatobiliary: The liver has a shrunken appearance and nodular contour, compatible with underlying cirrhosis. Multiple nodular appearing hypovascular areas on the initial postcontrast images which were obtained slightly early,  favored to represent a background of widespread regenerative nodules. No discrete  hypervascular hepatic lesion is confidently identified. This unusual pattern in the liver seems to normalize on the more delayed images in the visualized portions of the liver. No intra or extrahepatic biliary ductal dilatation. Status post cholecystectomy. Pancreas: No pancreatic mass. No pancreatic ductal dilatation. No pancreatic or peripancreatic fluid or inflammatory changes. Spleen: Unremarkable. Adrenals/Urinary Tract: Bilateral adrenal glands the left kidney are normal in appearance. Sub cm low-attenuation lesions in the right kidney are too small to definitively characterize, but are statistically likely to represent tiny cysts. 9 mm nonobstructive calculus in the lower pole collecting system of the right kidney. Mild right hydroureter. No calculi are identified along the course of either ureter or within the lumen of the urinary bladder. Urinary bladder is moderately distended. Bilateral adrenal glands are normal in appearance. Stomach/Bowel: Profound thickening of the gastric wall immediately distal to the gastroesophageal junction, somewhat mass-like in appearance, best appreciated on image 17 of series 2 where this measures up to 7.1 x 3.6 cm. No pathologic dilatation of small bowel or colon. Vascular/Lymphatic: Atherosclerotic calcifications throughout the abdominal and pelvic vasculature, without evidence of aneurysm or dissection. No lymphadenopathy noted in the abdomen or pelvis. Reproductive: Status post hysterectomy. Ovaries are not confidently identified may be surgically absent or atrophic. Other: Small volume of ascites.  No pneumoperitoneum. Musculoskeletal: There are no aggressive appearing lytic or blastic lesions noted in the visualized portions of the skeleton. IMPRESSION: 1. Mass-like thickening of the proximal stomach highly concerning for neoplasm. Correlation with nonemergent endoscopy is recommended in  the near future to exclude neoplasm. 2. Morphologic changes in the liver most compatible with underlying cirrhosis, as discussed above. The possibility of metastatic disease to the liver is not excluded, but is not strongly favored on the basis of today's examination. This could be more definitively evaluated with followup nonemergent MRI of the abdomen with and without IV gadolinium if clinically appropriate. 3. 9 mm nonobstructive calculus in the lower pole collecting system of the right kidney. 4. Mild right hydroureter, without frank hydronephrosis. This is of uncertain etiology and significance, but could indicate mild distal ureteral stricture related to remote passage of a stone. 5. Atherosclerosis. Electronically Signed: By: Vinnie Langton M.D. On: 07/04/2015 00:53    ASSESSMENT: Gastric thickening on CT scan.  PLAN:    1. Gastric thickening: Appreciate GI input. Recent findings are not new and have been evident since at least July 2016. Patient had EGD in July 2016 without evidence of a mass at that point. She likely will require another EGD to confirm, but this has not been scheduled. Given her mental status changes, if malignancy were diagnosed any treatments would be difficult. 2. Altered mental status: Unclear etiology, infectious workup is pending. 3. Leukocytosis: Likely reactive. Continue current antibiotics.  4. Thrombocytopenia: Mild, likely secondary to underlying cirrhosis. Monitor.  Appreciate consult, will follow.   Lloyd Huger, MD   07/06/2015 1:40 PM

## 2015-07-06 NOTE — Plan of Care (Signed)
Problem: Discharge Progression Outcomes Goal: Other Discharge Outcomes/Goals Outcome: Progressing Plan of Care Progress to Goal:   Pt has been lethargic during shift. Pt K was 2.9 this am. Paged and spoke with Dr. Marcille Blanco. IV and PO replacement ordered and will be administered. No other signs of distress noted. Will continue to monitor.

## 2015-07-06 NOTE — Progress Notes (Addendum)
MD aware of mental status change. Orders put in by MD. U/A done, Cdiff precautions initiated again. IVF bolus discontinued this am. On tele, HR in 130-140s this am, metoprolol and amiodarone given. Pts HR now ST, low 100s.

## 2015-07-06 NOTE — Plan of Care (Signed)
Problem: Discharge Progression Outcomes Goal: Other Discharge Outcomes/Goals Outcome: Progressing Plan of care progress to goal: Pt is alert, but disoriented to time, situation and place. CT negative. U/A collected, cdiff negative, waiting stool cultures. Blood cultures collected. IVF infusing. IV ABX infusing.  Potassium replaced IV this am. Tolerating diet when fed. Xray of abdomen ordered. No c/o nausea, but c/o abdominal pain. PRN meds ordered. Pt had bloody stool this am, MD aware. Lactulose BID for elevated ammonia level.

## 2015-07-06 NOTE — Progress Notes (Addendum)
Inpatient Diabetes Program Recommendations  AACE/ADA: New Consensus Statement on Inpatient Glycemic Control (2015)  Target Ranges:  Prepandial:   less than 140 mg/dL      Peak postprandial:   less than 180 mg/dL (1-2 hours)      Critically ill patients:  140 - 180 mg/dL  Results for DEVONDA, PEQUIGNOT (MRN [id number]) as of 07/06/2015 08:11  Ref. Range 07/05/2015 07:49 07/05/2015 11:50 07/05/2015 16:30 07/05/2015 21:16 07/06/2015 07:25  Glucose-Capillary Latest Ref Range: 65-99 mg/dL 214 (H) 264 (H) 165 (H) 149 (H) 286 (H)   Review of Glycemic Control  Current orders for Inpatient glycemic control: Novolog 0-15 units TID with meals, Novolog 0-5 units HS, Tradjenta 5 mg daily  Inpatient Diabetes Program Recommendations: Insulin - Basal: Please consider ordering low dose basal insulin. Recommend starting with Levemir 6 units Q24H (based on 60 kg x 0.1 units).  Thanks, Barnie Alderman, RN, MSN, CCRN, CDE Diabetes Coordinator Inpatient Diabetes Program [phone] (Team Pager from [place] to [place]) [phone] ([facility] office) [phone] [facility] office) [phone] [facility] office)

## 2015-07-06 NOTE — Progress Notes (Signed)
Paged and spoke with Dr. Marcille Blanco about pt orthostatic. Per Dr. Marcille Blanco, administer 1L NS bolus. Pass on information to oncoming nurse.

## 2015-07-06 NOTE — Progress Notes (Addendum)
ANTIBIOTIC CONSULT NOTE - INITIAL  Pharmacy Consult for Vancomycin/Zosyn  Indication: rule out sepsis  Allergies  Allergen Reactions  . Erythromycin Base Other (See Comments)    Reaction:  Fever   . Rosiglitazone Maleate Swelling  . Codeine Sulfate Rash  . Tetanus-Diphtheria Toxoids Td Rash and Other (See Comments)    Reaction:  Fever     Patient Measurements: Height: 5' 3"  (160 cm) Weight: 133 lb (60.328 kg) IBW/kg (Calculated) : 52.4 Adjusted Body Weight: 55.6 kg   Vital Signs: Temp: 97.5 F (36.4 C) (10/26 1259) Temp Source: Oral (10/26 1259) BP: 128/78 mmHg (10/26 1259) Pulse Rate: 101 (10/26 1453) Intake/Output from previous day: 10/25 0701 - 10/26 0700 In: 1423.3 [I.V.:1023.3; IV Piggyback:400] Out: 675 [Stool:675] Intake/Output from this shift:    Labs:  Recent Labs  07/05/15 0724 07/06/15 0414 07/06/15 0813 07/06/15 1451  WBC 11.7*  --  20.8* 22.7*  HGB 12.9  --  14.4 14.0  PLT 102*  --  118* 108*  CREATININE 0.87 0.94  --  0.97   Estimated Creatinine Clearance: 50.4 mL/min (by C-G formula based on Cr of 0.97). No results for input(s): VANCOTROUGH, VANCOPEAK, VANCORANDOM, GENTTROUGH, GENTPEAK, GENTRANDOM, TOBRATROUGH, TOBRAPEAK, TOBRARND, AMIKACINPEAK, AMIKACINTROU, AMIKACIN in the last 72 hours.   Microbiology: Recent Results (from the past 720 hour(s))  Stool culture     Status: None (Preliminary result)   Collection Time: 07/06/15  9:53 AM  Result Value Ref Range Status   Specimen Description STOOL  Final   Special Requests Normal  Final   Culture NO CAMPYLOBACTER DETECTED  Final   Report Status PENDING  Incomplete  C difficile quick scan w PCR reflex     Status: Abnormal   Collection Time: 07/06/15  9:53 AM  Result Value Ref Range Status   C Diff antigen POSITIVE (A) NEGATIVE Final   C Diff toxin NEGATIVE NEGATIVE Final   C Diff interpretation   Final    Negative for toxigenic C. difficile. Toxin gene and active toxin production not  detected. May be a nontoxigenic strain of C. difficile bacteria present, lacking the ability to produce toxin.  Clostridium Difficile by PCR     Status: None   Collection Time: 07/06/15  9:53 AM  Result Value Ref Range Status   Toxigenic C Difficile by pcr  NEGATIVE Final    Medical History: Past Medical History  Diagnosis Date  . Type 1 diabetes ([place])   . Hypothyroid   . Degenerative disk disease   . Stomach ulcer   . Diverticulitis   . Syncope 01/2015  . Anxiety   . GERD (gastroesophageal reflux disease)   . History of hiatal hernia   . Cancer (HCC)     HX OF CANCER OF UTERUS   . TIA (transient ischemic attack) 02/2015  . Hypertension   . Pancreatitis   . PAF (paroxysmal atrial fibrillation) ([place]) 03/2015    a. new onset 03/2015 in setting of intractable N/V; b. on Eliquis 5 mg bid; c. CHADSVASc 4 (DM, TIA x 2, female)  . Intussusception intestine ([place]) 05/2015    Medications:  Prescriptions prior to admission  Medication Sig Dispense Refill Last Dose  . amiodarone (PACERONE) 200 MG tablet Take 1 tablet (200 mg total) by mouth daily. 30 tablet 0 07/03/2015 at 1100  . apixaban (ELIQUIS) 5 MG TABS tablet Take 1 tablet (5 mg total) by mouth 2 (two) times daily. 60 tablet 0 07/03/2015 at 1100  . aspirin EC 325 MG tablet  Take 325 mg by mouth daily.   07/03/2015 at 1100  . atorvastatin (LIPITOR) 40 MG tablet Take 1 tablet (40 mg total) by mouth daily at 6 PM. 30 tablet 0 07/03/2015 at 1100  . citalopram (CELEXA) 20 MG tablet Take 20 mg by mouth daily.   07/03/2015 at 1100  . dicyclomine (BENTYL) 10 MG capsule Take 1 capsule by mouth 4 (four) times daily - after meals and at bedtime.    07/03/2015 at 1100  . JANUVIA 100 MG tablet Take 100 mg by mouth daily.   07/03/2015 at 1100  . lactulose (CHRONULAC) 10 GM/15ML solution Take 30 mLs (20 g total) by mouth 2 (two) times daily as needed for mild constipation. 240 mL 0 07/03/2015 at 1100  . lisinopril (PRINIVIL,ZESTRIL) 5 MG tablet Take  1 tablet by mouth daily.   07/03/2015 at 1100  . metFORMIN (GLUCOPHAGE) 1000 MG tablet Take 1 tablet by mouth 2 (two) times daily.   07/03/2015 at 1100  . metoprolol succinate (TOPROL-XL) 25 MG 24 hr tablet Take 1 tablet (25 mg total) by mouth daily. 30 tablet 0 07/03/2015 at 1100  . midodrine (PROAMATINE) 10 MG tablet Take 1 tablet (10 mg total) by mouth 3 (three) times daily with meals. 90 tablet 0 07/03/2015 at 1100  . morphine (MS CONTIN) 60 MG 12 hr tablet Take 1 tablet (60 mg total) by mouth 2 (two) times daily. 20 tablet 0 07/03/2015 at 1100  . omeprazole (PRILOSEC) 20 MG capsule Take 20 mg by mouth daily.   07/03/2015 at 1100  . promethazine (PHENERGAN) 25 MG tablet Take 25 mg by mouth every 6 (six) hours as needed for nausea or vomiting.   07/03/2015 at 1100  . psyllium (METAMUCIL) 58.6 % packet Take 1 packet by mouth every other day.   07/03/2015 at 1100  . tiZANidine (ZANAFLEX) 4 MG tablet Take 4 mg by mouth every 6 (six) hours.   07/03/2015 at 1100  . cefUROXime (CEFTIN) 500 MG tablet Take 1 tablet (500 mg total) by mouth 2 (two) times daily with a meal. (Patient not taking: Reported on 07/05/2015) 14 tablet 0   . pantoprazole (PROTONIX) 40 MG tablet Take 1 tablet (40 mg total) by mouth 2 (two) times daily. (Patient not taking: Reported on 07/05/2015) 60 tablet 0    Assessment: Pseudomonas risk factors:  Pt on Cefuroxime at home .   CrCl = 50.4 ml/min Ke = 0.046hr-1 T1/2 = 15 hrs Vd = 42.2 L   Goal of Therapy:  Vancomycin trough level 15-20 mcg/ml  Plan:  Expected duration 7 days with resolution of temperature and/or normalization of WBC  Will order Zosyn 4.5 gm IV Q8H EI .  Vancomycin 1 gm IV X 1 given on 10/26 @ 17:00.  Vancomycin 750 mg IV Q18H ordered to start on 10/27 @ 4:00, 11 hrs after 1st dose (stacked dosing).  This pt will reach Css by 10/30 @ 17:00. Will draw 1st trough on 10/30 @ 3:30, which will be approaching Css.   Enez Monahan D 07/06/2015,4:42 PM

## 2015-07-06 NOTE — Progress Notes (Signed)
Paged and spoke with Dr. Marcille Blanco about pt having bright red blood in brief which is mixed with stool. Pt is not following commands very well and was pocketing tablets this am. Per Dr. Marcille Blanco, perform orthostatic vital signs and call back if positive.. Will continue to monitor.

## 2015-07-06 NOTE — Progress Notes (Signed)
PT Cancellation Note  Patient Details Name: [name] MRN: [id number] DOB: Aug 06, 1956   Cancelled Treatment:    Reason Eval/Treat Not Completed: Patient's level of consciousness (Attempted to see pt when Dr. Gustavo Lah was performing an assessment. Per MD pt not appropriate due to inability to respond to commands. )   Milon Score 07/06/2015, 3:11 PM

## 2015-07-06 NOTE — Consult Note (Signed)
Subjective: Patient seen for nausea vomiting and abdominal pain. She is minimally responsive to examine her today. He will not answer questions except with a one-word and does not answer a follow-up question. He will not follow commands. Does deny any nausea. sHe does eat that her stomach hurts him. Does not elaborate on that.  Objective: Vital signs in last 24 hours: Temp:  [97.5 F (36.4 C)-98.5 F (36.9 C)] 97.5 F (36.4 C) (10/26 1259) Pulse Rate:  [64-145] 101 (10/26 1453) Resp:  [18-20] 20 (10/26 1259) BP: (120-138)/(64-109) 128/78 mmHg (10/26 1259) SpO2:  [96 %-100 %] 100 % (10/26 1259) Blood pressure 128/78, pulse 101, temperature 97.5 F (36.4 C), temperature source Oral, resp. rate 20, height 5' 3"  (1.6 m), weight 60.328 kg (133 lb), SpO2 100 %.   Intake/Output from previous day: 10/25 0701 - 10/26 0700 In: 1423.3 [I.V.:1023.3; IV Piggyback:400] Out: 675 [Stool:675]  Intake/Output this shift:     General appearance:  Elderly-appearing 59 year old female no acute distress Resp:  Clear to auscultation Cardio:  Regular rate and rhythm GI:  Soft, nondistended, possible discomfort in the left lower quadrant. It is not repeatable on examination. Is no rebound. Extremities:  No clubbing cyanosis or edema   Lab Results: Results for orders placed or performed during the hospital encounter of 07/03/15 (from the past 24 hour(s))  Glucose, capillary     Status: Abnormal   Collection Time: 07/05/15  4:30 PM  Result Value Ref Range   Glucose-Capillary 165 (H) 65 - 99 mg/dL  Glucose, capillary     Status: Abnormal   Collection Time: 07/05/15  9:16 PM  Result Value Ref Range   Glucose-Capillary 149 (H) 65 - 99 mg/dL  Basic metabolic panel     Status: Abnormal   Collection Time: 07/06/15  4:14 AM  Result Value Ref Range   Sodium 139 135 - 145 mmol/L   Potassium 2.9 (LL) 3.5 - 5.1 mmol/L   Chloride 109 101 - 111 mmol/L   CO2 19 (L) 22 - 32 mmol/L   Glucose, Bld 284 (H) 65 -  99 mg/dL   BUN 21 (H) 6 - 20 mg/dL   Creatinine, Ser 0.94 0.44 - 1.00 mg/dL   Calcium 8.5 (L) 8.9 - 10.3 mg/dL   GFR calc non Af Amer >60 >60 mL/min   GFR calc Af Amer >60 >60 mL/min   Anion gap 11 5 - 15  Magnesium     Status: Abnormal   Collection Time: 07/06/15  4:14 AM  Result Value Ref Range   Magnesium 1.6 (L) 1.7 - 2.4 mg/dL  Glucose, capillary     Status: Abnormal   Collection Time: 07/06/15  7:25 AM  Result Value Ref Range   Glucose-Capillary 286 (H) 65 - 99 mg/dL   Comment 1 Notify RN   CBC     Status: Abnormal   Collection Time: 07/06/15  8:13 AM  Result Value Ref Range   WBC 20.8 (H) 3.6 - 11.0 K/uL   RBC 4.41 3.80 - 5.20 MIL/uL   Hemoglobin 14.4 12.0 - 16.0 g/dL   HCT 43.6 35.0 - 47.0 %   MCV 98.8 80.0 - 100.0 fL   MCH 32.6 26.0 - 34.0 pg   MCHC 33.0 32.0 - 36.0 g/dL   RDW 16.5 (H) 11.5 - 14.5 %   Platelets 118 (L) 150 - 440 K/uL  Ammonia     Status: Abnormal   Collection Time: 07/06/15  8:13 AM  Result Value Ref Range  Ammonia 41 (H) 9 - 35 umol/L  Hepatic function panel     Status: Abnormal   Collection Time: 07/06/15  8:13 AM  Result Value Ref Range   Total Protein 5.4 (L) 6.5 - 8.1 g/dL   Albumin 3.0 (L) 3.5 - 5.0 g/dL   AST 47 (H) 15 - 41 U/L   ALT 63 (H) 14 - 54 U/L   Alkaline Phosphatase 99 38 - 126 U/L   Total Bilirubin 1.0 0.3 - 1.2 mg/dL   Bilirubin, Direct 0.3 0.1 - 0.5 mg/dL   Indirect Bilirubin 0.7 0.3 - 0.9 mg/dL  Urinalysis complete, with microscopic ([facility] only)     Status: Abnormal   Collection Time: 07/06/15  9:53 AM  Result Value Ref Range   Color, Urine YELLOW (A) YELLOW   APPearance CLOUDY (A) CLEAR   Glucose, UA >500 (A) NEGATIVE mg/dL   Bilirubin Urine NEGATIVE NEGATIVE   Ketones, ur 1+ (A) NEGATIVE mg/dL   Specific Gravity, Urine 1.024 1.005 - 1.030   Hgb urine dipstick 3+ (A) NEGATIVE   pH 5.0 5.0 - 8.0   Protein, ur 30 (A) NEGATIVE mg/dL   Nitrite NEGATIVE NEGATIVE   Leukocytes, UA NEGATIVE NEGATIVE   RBC / HPF TOO  NUMEROUS TO COUNT 0 - 5 RBC/hpf   WBC, UA 6-30 0 - 5 WBC/hpf   Bacteria, UA NONE SEEN NONE SEEN   Squamous Epithelial / LPF 0-5 (A) NONE SEEN   Mucous PRESENT    Budding Yeast PRESENT    Hyaline Casts, UA PRESENT   Stool culture     Status: None (Preliminary result)   Collection Time: 07/06/15  9:53 AM  Result Value Ref Range   Specimen Description STOOL    Special Requests Normal    Culture NO CAMPYLOBACTER DETECTED    Report Status PENDING   C difficile quick scan w PCR reflex     Status: Abnormal   Collection Time: 07/06/15  9:53 AM  Result Value Ref Range   C Diff antigen POSITIVE (A) NEGATIVE   C Diff toxin NEGATIVE NEGATIVE   C Diff interpretation      Negative for toxigenic C. difficile. Toxin gene and active toxin production not detected. May be a nontoxigenic strain of C. difficile bacteria present, lacking the ability to produce toxin.  Glucose, capillary     Status: Abnormal   Collection Time: 07/06/15 11:42 AM  Result Value Ref Range   Glucose-Capillary 236 (H) 65 - 99 mg/dL   Comment 1 Notify RN   CBC     Status: Abnormal   Collection Time: 07/06/15  2:51 PM  Result Value Ref Range   WBC 22.7 (H) 3.6 - 11.0 K/uL   RBC 4.39 3.80 - 5.20 MIL/uL   Hemoglobin 14.0 12.0 - 16.0 g/dL   HCT 42.6 35.0 - 47.0 %   MCV 97.0 80.0 - 100.0 fL   MCH 31.9 26.0 - 34.0 pg   MCHC 32.9 32.0 - 36.0 g/dL   RDW 15.9 (H) 11.5 - 14.5 %   Platelets 108 (L) 150 - 440 K/uL  Basic metabolic panel     Status: Abnormal   Collection Time: 07/06/15  2:51 PM  Result Value Ref Range   Sodium 138 135 - 145 mmol/L   Potassium 3.7 3.5 - 5.1 mmol/L   Chloride 110 101 - 111 mmol/L   CO2 20 (L) 22 - 32 mmol/L   Glucose, Bld 295 (H) 65 - 99 mg/dL   BUN 23 (H)  6 - 20 mg/dL   Creatinine, Ser 0.97 0.44 - 1.00 mg/dL   Calcium 8.4 (L) 8.9 - 10.3 mg/dL   GFR calc non Af Amer >60 >60 mL/min   GFR calc Af Amer >60 >60 mL/min   Anion gap 8 5 - 15      Recent Labs  07/05/15 0724 07/06/15 0813  07/06/15 1451  WBC 11.7* 20.8* 22.7*  HGB 12.9 14.4 14.0  HCT 39.2 43.6 42.6  PLT 102* 118* 108*   BMET  Recent Labs  07/05/15 0724 07/06/15 0414 07/06/15 1451  NA 143 139 138  K 3.1* 2.9* 3.7  CL 111 109 110  CO2 23 19* 20*  GLUCOSE 247* 284* 295*  BUN 22* 21* 23*  CREATININE 0.87 0.94 0.97  CALCIUM 8.5* 8.5* 8.4*   LFT  Recent Labs  07/06/15 0813  PROT 5.4*  ALBUMIN 3.0*  AST 47*  ALT 63*  ALKPHOS 99  BILITOT 1.0  BILIDIR 0.3  IBILI 0.7   PT/INR No results for input(s): LABPROT, INR in the last 72 hours. Hepatitis Panel No results for input(s): HEPBSAG, HCVAB, HEPAIGM, HEPBIGM in the last 72 hours. C-Diff  Recent Labs  07/06/15 0953  CDIFFTOX NEGATIVE   No results for input(s): CDIFFPCR in the last 72 hours.   Studies/Results: Ct Head Wo Contrast  07/06/2015  CLINICAL DATA:  Altered mental status. EXAM: CT HEAD WITHOUT CONTRAST TECHNIQUE: Contiguous axial images were obtained from the base of the skull through the vertex without intravenous contrast. COMPARISON:  CT scan of May 27, 2015. FINDINGS: Bony calvarium appears intact. Mild chronic ischemic white matter disease is noted. No mass effect or midline shift is noted. Ventricular size is within normal limits. There is no evidence of mass lesion, hemorrhage or acute infarction. IMPRESSION: Mild chronic ischemic white matter disease. No acute intracranial abnormality seen. Electronically Signed   By: Marijo Conception, M.D.   On: 07/06/2015 09:27    Scheduled Inpatient Medications:   . amiodarone  200 mg Oral Daily  . cefTRIAXone (ROCEPHIN)  IV  1 g Intravenous Q24H  . citalopram  20 mg Oral Daily  . dicyclomine  10 mg Oral TID PC & HS  . feeding supplement  1 Container Oral TID WC  . insulin aspart  0-15 Units Subcutaneous TID WC  . insulin aspart  0-5 Units Subcutaneous QHS  . insulin detemir  5 Units Subcutaneous Daily  . lactulose  20 g Oral Daily  . lisinopril  5 mg Oral Daily  .  metoprolol succinate  25 mg Oral Daily  . midodrine  10 mg Oral TID WC  . morphine  60 mg Oral BID  . pantoprazole  40 mg Oral BID  . potassium chloride  40 mEq Oral BID    Continuous Inpatient Infusions:   . sodium chloride 100 mL/hr at 07/05/15 2333    PRN Inpatient Medications:  acetaminophen **OR** acetaminophen, hydrALAZINE, HYDROmorphone (DILAUDID) injection, ondansetron **OR** ondansetron (ZOFRAN) IV, oxyCODONE  Miscellaneous:   Assessment:  1. Nausea, vomiting, abdominal pain. This seems to be resolved. Abdominal exam shows a non-repeatable discomfort in the left lower quadrant. Review of CT scan of the abdomen done several days ago is negative for any etiology in the left lower quadrant. 2. Abnormal CT scan with thickening of the GE junction and proximal stomach noted. See my note of yesterday explaining CT and CT scan review by radiology. 3. Altered mental status. Patient was somewhat hyperammonemic however I do not feel  that this would account for her recurrent mentation. Generally patients who have chronic histories liver disease and finding of of elevated ammonia a minimal elevation such as 40 would not be encephalopathic. Another etiology needs to be entertained, or possibly mixed etiology. 4. Rectal bleeding. Digital rectal examination shows no distinct lesion but what is seen is some mucoid bright red and material that is likely more so anal outlet. Again CT scan did not indicate a mass or other lesion in the colon particularly the distal colon which would account for this. Patient is cirrhotic with low platelets as well as coagulopathy in that regard. She had been given a day of enemas which may have caused trauma to the distal rectum which would account for this finding. Of note patient is also on a blood thinner. Review of chart indicates she had a colonoscopy done on 10/11/2014. There are no results available for that.  Plan:  1. Case discussed with Dr. Volanda Napoleon. With her  altered mental status would not currently pursue sedated procedures. Findings as indicated on the CT scans and recent EGD repeat EGD may be very low yield. As could be repeated when clinically feasible once patient's mentation is more appropriate. 2. Will order further laboratory evaluation of possible etiology of her cirrhosis. 3. Patient also had an EGD on 05/11/2014 at [facility] showing a questionable eosinophilic esophagitis as well as a intact Nissen fundoplication. A empiric dilation was performed with a savory dilator at that time. Biopsies done on that procedure were negative for eosinophilic esophagitis. It is of note that on her recent EGD done 04/04/2015 at [facility] was no mention of appearance of eosinophilic esophagitis. It is quite likely her dysphagia that she was experiencing at that time was due to a tight wrap in regards to her history of Nissen fundoplication.  Lollie Sails MD 07/06/2015, 3:24 PM

## 2015-07-07 LAB — COMPREHENSIVE METABOLIC PANEL
ALBUMIN: 2.6 g/dL — AB (ref 3.5–5.0)
ALK PHOS: 81 U/L (ref 38–126)
ALT: 63 U/L — ABNORMAL HIGH (ref 14–54)
ANION GAP: 5 (ref 5–15)
AST: 43 U/L — ABNORMAL HIGH (ref 15–41)
BUN: 25 mg/dL — ABNORMAL HIGH (ref 6–20)
CHLORIDE: 113 mmol/L — AB (ref 101–111)
CO2: 21 mmol/L — AB (ref 22–32)
Calcium: 8.3 mg/dL — ABNORMAL LOW (ref 8.9–10.3)
Creatinine, Ser: 0.93 mg/dL (ref 0.44–1.00)
GFR calc Af Amer: >60 mL/min (ref >60)
GFR calc non Af Amer: >60 mL/min (ref >60)
GLUCOSE: 313 mg/dL — AB (ref 65–99)
POTASSIUM: 3.9 mmol/L (ref 3.5–5.1)
SODIUM: 139 mmol/L (ref 135–145)
Total Bilirubin: 0.7 mg/dL (ref 0.3–1.2)
Total Protein: 5.1 g/dL — ABNORMAL LOW (ref 6.5–8.1)

## 2015-07-07 LAB — CBC
HCT: 38.3 % (ref 35.0–47.0)
HEMOGLOBIN: 12.7 g/dL (ref 12.0–16.0)
MCH: 32.3 pg (ref 26.0–34.0)
MCHC: 33.3 g/dL (ref 32.0–36.0)
MCV: 96.9 fL (ref 80.0–100.0)
PLATELETS: 97 10*3/uL — AB (ref 150–440)
RBC: 3.95 MIL/uL (ref 3.80–5.20)
RDW: 15.9 % — AB (ref 11.5–14.5)
WBC: 21 10*3/uL — ABNORMAL HIGH (ref 3.6–11.0)

## 2015-07-07 LAB — GLUCOSE, CAPILLARY
GLUCOSE-CAPILLARY: 181 mg/dL — AB (ref 65–99)
GLUCOSE-CAPILLARY: 205 mg/dL — AB (ref 65–99)
Glucose-Capillary: 255 mg/dL — ABNORMAL HIGH (ref 65–99)
Glucose-Capillary: 267 mg/dL — ABNORMAL HIGH (ref 65–99)

## 2015-07-07 LAB — BLOOD GAS, ARTERIAL
Acid-base deficit: 4.4 mmol/L — ABNORMAL HIGH (ref 0.0–2.0)
Allens test (pass/fail): POSITIVE — AB
Bicarbonate: 21 mEq/L (ref 21.0–28.0)
FIO2: 0.21
O2 Saturation: 95.1 %
PCO2 ART: 39 mmHg (ref 32.0–48.0)
PH ART: 7.34 — AB (ref 7.350–7.450)
Patient temperature: 37
pO2, Arterial: 81 mmHg — ABNORMAL LOW (ref 83.0–108.0)

## 2015-07-07 LAB — AMMONIA: Ammonia: 35 umol/L (ref 9–35)

## 2015-07-07 MED ORDER — APIXABAN 5 MG PO TABS
5.0000 mg | ORAL_TABLET | Freq: Two times a day (BID) | ORAL | Status: DC
  Administered 2015-07-07 – 2015-07-11 (×8): 5 mg via ORAL
  Filled 2015-07-07 (×9): qty 1

## 2015-07-07 MED ORDER — NALOXONE HCL 0.4 MG/ML IJ SOLN
0.4000 mg | Freq: Once | INTRAMUSCULAR | Status: AC
  Administered 2015-07-07: 15:00:00 0.4 mg via INTRAVENOUS
  Filled 2015-07-07: qty 1

## 2015-07-07 MED ORDER — INSULIN DETEMIR 100 UNIT/ML ~~LOC~~ SOLN
10.0000 [IU] | Freq: Every day | SUBCUTANEOUS | Status: DC
  Administered 2015-07-08: 10 [IU] via SUBCUTANEOUS
  Filled 2015-07-07: qty 0.1

## 2015-07-07 MED ORDER — METRONIDAZOLE 500 MG PO TABS
500.0000 mg | ORAL_TABLET | Freq: Three times a day (TID) | ORAL | Status: DC
  Administered 2015-07-07 – 2015-07-11 (×12): 500 mg via ORAL
  Filled 2015-07-07 (×13): qty 1

## 2015-07-07 MED ORDER — INSULIN DETEMIR 100 UNIT/ML ~~LOC~~ SOLN
5.0000 [IU] | Freq: Once | SUBCUTANEOUS | Status: AC
  Administered 2015-07-07: 5 [IU] via SUBCUTANEOUS
  Filled 2015-07-07: qty 0.05

## 2015-07-07 NOTE — Consult Note (Signed)
Subjective: Patient seen for nausea vomiting and abdominal pain. Patient somewhat more responsive today, still will not follow commands, minimal answers to questions. No emesis, possible mild nausea, denies abdominal pain.   Objective: Vital signs in last 24 hours: Temp:  [97.8 F (36.6 C)-98.6 F (37 C)] 98.6 F (37 C) (10/27 1255) Pulse Rate:  [90-103] 90 (10/27 1255) Resp:  [18-20] 20 (10/27 1255) BP: (125-178)/(61-91) 129/91 mmHg (10/27 1255) SpO2:  [97 %-100 %] 98 % (10/27 1255) Blood pressure 129/91, pulse 90, temperature 98.6 F (37 C), temperature source Oral, resp. rate 20, height 5' 3"  (1.6 m), weight 60.328 kg (133 lb), SpO2 98 %.   Intake/Output from previous day:    Intake/Output this shift:     General appearance:  Elderly appearing 33 female no distress Resp:  clear to auscultation Cardio:  rrr GI:  Soft non-distended, no epigastric pain to palpation, patient grimaces on soft  palpation of the llq. No masses felt. No rebound.  Extremities:  No cce.    Lab Results: Results for orders placed or performed during the hospital encounter of 07/03/15 (from the past 24 hour(s))  Glucose, capillary     Status: Abnormal   Collection Time: 07/06/15  9:48 PM  Result Value Ref Range   Glucose-Capillary 254 (H) 65 - 99 mg/dL  CBC     Status: Abnormal   Collection Time: 07/07/15  4:34 AM  Result Value Ref Range   WBC 21.0 (H) 3.6 - 11.0 K/uL   RBC 3.95 3.80 - 5.20 MIL/uL   Hemoglobin 12.7 12.0 - 16.0 g/dL   HCT 38.3 35.0 - 47.0 %   MCV 96.9 80.0 - 100.0 fL   MCH 32.3 26.0 - 34.0 pg   MCHC 33.3 32.0 - 36.0 g/dL   RDW 15.9 (H) 11.5 - 14.5 %   Platelets 97 (L) 150 - 440 K/uL  Comprehensive metabolic panel     Status: Abnormal   Collection Time: 07/07/15  4:34 AM  Result Value Ref Range   Sodium 139 135 - 145 mmol/L   Potassium 3.9 3.5 - 5.1 mmol/L   Chloride 113 (H) 101 - 111 mmol/L   CO2 21 (L) 22 - 32 mmol/L   Glucose, Bld 313 (H) 65 - 99 mg/dL   BUN 25 (H) 6 -  20 mg/dL   Creatinine, Ser 0.93 0.44 - 1.00 mg/dL   Calcium 8.3 (L) 8.9 - 10.3 mg/dL   Total Protein 5.1 (L) 6.5 - 8.1 g/dL   Albumin 2.6 (L) 3.5 - 5.0 g/dL   AST 43 (H) 15 - 41 U/L   ALT 63 (H) 14 - 54 U/L   Alkaline Phosphatase 81 38 - 126 U/L   Total Bilirubin 0.7 0.3 - 1.2 mg/dL   GFR calc non Af Amer >60 >60 mL/min   GFR calc Af Amer >60 >60 mL/min   Anion gap 5 5 - 15  Ammonia     Status: None   Collection Time: 07/07/15  4:34 AM  Result Value Ref Range   Ammonia 35 9 - 35 umol/L  Glucose, capillary     Status: Abnormal   Collection Time: 07/07/15  7:38 AM  Result Value Ref Range   Glucose-Capillary 255 (H) 65 - 99 mg/dL  Blood gas, arterial     Status: Abnormal   Collection Time: 07/07/15  8:25 AM  Result Value Ref Range   FIO2 0.21    pH, Arterial 7.34 (L) 7.350 - 7.450   pCO2 arterial  39 32.0 - 48.0 mmHg   pO2, Arterial 81 (L) 83.0 - 108.0 mmHg   Bicarbonate 21.0 21.0 - 28.0 mEq/L   Acid-base deficit 4.4 (H) 0.0 - 2.0 mmol/L   O2 Saturation 95.1 %   Patient temperature 37.0    Collection site REVIEWED BY    Sample type ARTERIAL DRAW    Allens test (pass/fail) POSITIVE (A) PASS  Glucose, capillary     Status: Abnormal   Collection Time: 07/07/15 11:35 AM  Result Value Ref Range   Glucose-Capillary 267 (H) 65 - 99 mg/dL  Glucose, capillary     Status: Abnormal   Collection Time: 07/07/15  4:47 PM  Result Value Ref Range   Glucose-Capillary 181 (H) 65 - 99 mg/dL      Recent Labs  07/06/15 0813 07/06/15 1451 07/07/15 0434  WBC 20.8* 22.7* 21.0*  HGB 14.4 14.0 12.7  HCT 43.6 42.6 38.3  PLT 118* 108* 97*   BMET  Recent Labs  07/06/15 0414 07/06/15 1451 07/07/15 0434  NA 139 138 139  K 2.9* 3.7 3.9  CL 109 110 113*  CO2 19* 20* 21*  GLUCOSE 284* 295* 313*  BUN 21* 23* 25*  CREATININE 0.94 0.97 0.93  CALCIUM 8.5* 8.4* 8.3*   LFT  Recent Labs  07/06/15 0813 07/07/15 0434  PROT 5.4* 5.1*  ALBUMIN 3.0* 2.6*  AST 47* 43*  ALT 63* 63*   ALKPHOS 99 81  BILITOT 1.0 0.7  BILIDIR 0.3  --   IBILI 0.7  --    PT/INR No results for input(s): LABPROT, INR in the last 72 hours. Hepatitis Panel No results for input(s): HEPBSAG, HCVAB, HEPAIGM, HEPBIGM in the last 72 hours. C-Diff  Recent Labs  07/06/15 0953  CDIFFTOX NEGATIVE   No results for input(s): CDIFFPCR in the last 72 hours.   Studies/Results: Dg Abd 1 View  07/06/2015  CLINICAL DATA:  Abdominal pain with blood in stool EXAM: ABDOMEN - 1 VIEW COMPARISON:  CT abdomen and pelvis July 03, 2015 FINDINGS: There is moderate stool in the colon. There is no bowel dilatation or air-fluid level suggesting obstruction. No free air is seen on this supine examination. There are phleboliths in the pelvis. There is lumbar levoscoliosis. IMPRESSION: Bowel gas pattern unremarkable.  Moderate stool in colon. Electronically Signed   By: Lowella Grip III M.D.   On: 07/06/2015 15:57   Ct Head Wo Contrast  07/06/2015  CLINICAL DATA:  Altered mental status. EXAM: CT HEAD WITHOUT CONTRAST TECHNIQUE: Contiguous axial images were obtained from the base of the skull through the vertex without intravenous contrast. COMPARISON:  CT scan of May 27, 2015. FINDINGS: Bony calvarium appears intact. Mild chronic ischemic white matter disease is noted. No mass effect or midline shift is noted. Ventricular size is within normal limits. There is no evidence of mass lesion, hemorrhage or acute infarction. IMPRESSION: Mild chronic ischemic white matter disease. No acute intracranial abnormality seen. Electronically Signed   By: Marijo Conception, M.D.   On: 07/06/2015 09:27   Dg Chest Port 1 View  07/06/2015  CLINICAL DATA:  Patient has developing altered mental status, bloody stools and unspecified abdominal pain that began today. EXAM: PORTABLE CHEST 1 VIEW COMPARISON:  06/02/2015 FINDINGS: The heart size and mediastinal contours are within normal limits. Both lungs are clear. The visualized  skeletal structures are unremarkable. IMPRESSION: No active disease. Electronically Signed   By: Lajean Manes M.D.   On: 07/06/2015 15:58    Scheduled  Inpatient Medications:   . amiodarone  200 mg Oral Daily  . apixaban  5 mg Oral BID  . citalopram  20 mg Oral Daily  . dicyclomine  10 mg Oral TID PC & HS  . feeding supplement  1 Container Oral TID WC  . insulin aspart  0-15 Units Subcutaneous TID WC  . insulin aspart  0-5 Units Subcutaneous QHS  . [START ON 07/08/2015] insulin detemir  10 Units Subcutaneous Daily  . lactulose  20 g Oral Daily  . lisinopril  5 mg Oral Daily  . metoprolol succinate  25 mg Oral Daily  . metroNIDAZOLE  500 mg Oral 3 times per day  . midodrine  10 mg Oral TID WC  . pantoprazole  40 mg Oral BID  . piperacillin-tazobactam (ZOSYN)  IV  4.5 g Intravenous 3 times per day  . potassium chloride  40 mEq Oral BID  . vancomycin  750 mg Intravenous Q18H    Continuous Inpatient Infusions:   . sodium chloride 100 mL/hr at 07/07/15 1744    PRN Inpatient Medications:  acetaminophen **OR** acetaminophen, hydrALAZINE, ondansetron **OR** ondansetron (ZOFRAN) IV  Miscellaneous:   Assessment:  1) history of cirrhosis of uncertain etiology 2) history of nissen fundoplication.  3) last colonoscopy 10/11/14, normal per repost, however previous colonoscopy showing diverticulosis.   Plan: 1) epigastric pain is not epigastric on my exam today but llq.  Raises question of possible diverticulitis as a source of sepsis/ams. Currently on zosyn/vanc/flagyl.  Continue current.  2) labs ordered for further evaluation of liver disease.  Lollie Sails MD 07/07/2015, 7:20 PM

## 2015-07-07 NOTE — Progress Notes (Signed)
Physical Therapy Treatment Patient Details Name: [name] MRN: [id number] DOB: June 15, 1956 Today's Date: 07/07/2015    History of Present Illness Pt admitted with intractable nausea and vomiting as well as concern for gastric neoplasm. Pt also complaining of R and L elbow pain secondary to recent fall but radiographs were negative for fracture. Pt with history of frequent falls at home. PMH - syncope, HTN, chronic pain, afib, HTN, uterine CA. Pt is somewhat confused today and RN is aware and has spoken with GI who ordered a stat ammonia on her. Family reports 5 falls in the last 12 months and 3 in the last "couple months." Pt has 24/7 assistance at home from husband and sons. She ambulates with hand held assist and infrequent use of rolling walker    PT Comments    Patient with possible mild L facial droop and questionable mild weakness of L UE/LE, but difficult to formally assess/quantify due to limited command-following.  Also noted with significant drop in SBP with transition to upright; vitals noted in flowsheet.  Patient largely non-verbal throughout session, responsive only to painful stimuli to all extremities.  RN/neurologist informed/aware. Will continue to assess throughout hospital stay.   Follow Up Recommendations  SNF     Equipment Recommendations  None recommended by PT    Recommendations for Other Services       Precautions / Restrictions Precautions Precautions: Fall Precaution Comments: Enteric isolation Restrictions Weight Bearing Restrictions: No    Mobility  Bed Mobility Overal bed mobility: Needs Assistance Bed Mobility: Supine to Sit     Supine to sit: Supervision     General bed mobility comments: increased time; gestures from therapist for task initiation  Transfers Overall transfer level: Needs assistance Equipment used: Rolling walker (2 wheeled) Transfers: Sit to/from Stand Sit to Stand: Min assist         General transfer comment:  assist for initiation, balance and overall safety; assist for bilat UE (L > R) hand placement on RW  Ambulation/Gait Ambulation/Gait assistance: Min assist Ambulation Distance (Feet): 5 Feet Assistive device: Rolling walker (2 wheeled)       General Gait Details: short, shuffling steps; intermittently releasing grasp on RW with bilat UEs.  No instances of LE buckling, but generally unsteady and unsafe.   Stairs            Wheelchair Mobility    Modified Rankin (Stroke Patients Only)       Balance Overall balance assessment: Needs assistance Sitting-balance support: No upper extremity supported;Feet supported Sitting balance-Leahy Scale: Good     Standing balance support: Bilateral upper extremity supported Standing balance-Leahy Scale: Fair Standing balance comment: LEs tremulous with static stance, but no overt buckling                    Cognition Arousal/Alertness: Awake/alert Behavior During Therapy: Flat affect Overall Cognitive Status: Impaired/Different from baseline Area of Impairment: Orientation;Attention;Memory;Following commands;Safety/judgement;Awareness;Problem solving     Memory: Decreased short-term memory Following Commands: Follows one step commands inconsistently (approx 75% simple, one-step commands) Safety/Judgement: Decreased awareness of safety;Decreased awareness of deficits   Problem Solving: Slow processing;Decreased initiation;Difficulty sequencing;Requires verbal cues;Requires tactile cues General Comments: patient non-verbal throughout evaluation; marked delay in processing, initiation.    Exercises Other Exercises Other Exercises: Orthostatic assessment as noted above; significant drop in BP.  Patient unable to provide subjective information regarding potential symptoms; no significant objective indicators noted. Other Exercises: Attempted to reassess strength, coordination, sensation of all extremities this date.  Noted mild  L facial droop.  Bilat UEs and LEs grossly at least 3-/5 as noted with functional activity and spontaneous movement; unable to participate with formal MMT.  Does not appear to utilize L UE as frequently as R UE (but could be due to being R handed at baseline?).  Patient able to respond/detect painful stimuli to L UE/LE; unable to participate with additional sensory testing due to cognitive deficit.  + Babinski L LE, negative R LE.   RN/neurologist informed and aware of all assessment information.    General Comments        Pertinent Vitals/Pain Pain Assessment: Faces Faces Pain Scale: No hurt Pain Location: Patient non-verbal throughout session; no clinical indicators of pain noted    Home Living                      Prior Function            PT Goals (current goals can now be found in the care plan section) Acute Rehab PT Goals Patient Stated Goal: "I'll do whatever to get better" PT Goal Formulation: With patient/family Time For Goal Achievement: 07/18/15 Potential to Achieve Goals: Fair Progress towards PT goals: Progressing toward goals    Frequency  Min 2X/week    PT Plan Current plan remains appropriate    Co-evaluation             End of Session Equipment Utilized During Treatment: Gait belt Activity Tolerance: Patient limited by fatigue;Patient limited by lethargy;Treatment limited secondary to medical complications (Comment) (confusion, limited command-following) Patient left: in chair;with call bell/phone within reach;with chair alarm set     Time: 1914-7829 PT Time Calculation (min) (ACUTE ONLY): 29 min  Charges:  $Therapeutic Exercise: 8-22 mins $Therapeutic Activity: 8-22 mins                    G Codes:      Ellison Hughs 07-23-2015, 2:24 PM

## 2015-07-07 NOTE — Progress Notes (Signed)
Inpatient Diabetes Program Recommendations  AACE/ADA: New Consensus Statement on Inpatient Glycemic Control (2015)  Target Ranges:  Prepandial:   less than 140 mg/dL      Peak postprandial:   less than 180 mg/dL (1-2 hours)      Critically ill patients:  140 - 180 mg/dL  Results for [name], [name] (MRN [id number]) as of 07/07/2015 08:25  Ref. Range 07/06/2015 07:25 07/06/2015 11:42 07/06/2015 16:30 07/06/2015 21:48 07/07/2015 07:38  Glucose-Capillary Latest Ref Range: 65-99 mg/dL 286 (H) 236 (H) 296 (H) 254 (H) 255 (H)   Review of Glycemic Control  Current orders for Inpatient glycemic control: Levemir 5 units daily, Novolog 0-15 units TID with meals, Novolog 0-5 units HS  Inpatient Diabetes Program Recommendations: Insulin - Basal: Levemir 5 units was started yesterday and glucose ranged from 236-296 mg/dl on 07/06/15. Fasting glucose is 255 mg/dl this morning. Please consider increasing Levemir to 12 units daily (based on 60 kg x 0.20 units).  Thanks, Barnie Alderman, RN, MSN, CCRN, CDE Diabetes Coordinator Inpatient Diabetes Program [phone] (Team Pager from [place] to [place]) [phone] ([place] office) [phone] [facility] office) [phone] [facility] office)

## 2015-07-07 NOTE — Plan of Care (Signed)
Problem: Discharge Progression Outcomes Goal: Other Discharge Outcomes/Goals Outcome: Progressing Plan of care progress to goal for: 1. Discharge Plan:         MD speaking with Husband tomorrow regarding Care. 2. Pain:         Denies pain. 3. Hemodynamically Stable:         VSS.         Afebrile.         WBC's Remain Elevated.         Remains on IVF's.         Remains on IV Antibiotics.         Remains on Oral Antibiotics.          4. Complications:         Disoriented. Nonverbal.         Incontinent of Urine.         Narcan 36m IV given as ordered. Aroused after dose of Narcan administered. Oriented to Self.               Oriented to Place.         Returned back to being Nonverbal. 5. Diet:         Clear Liquid. Decreased Appetite. Only taking Sips. 6. Activity:         Two Person Assist.

## 2015-07-07 NOTE — Progress Notes (Signed)
[place] at [place] NAME: [name]    MR#:  [id number]  DATE OF BIRTH:  Jul 21, 1956  SUBJECTIVE:  CHIEF COMPLAINT:  Continues to be altered. Answers questions with yes or no but cannot give any more detail.  REVIEW OF SYSTEMS:   Unable to obtain due to altered mental status  DRUG ALLERGIES:   Allergies  Allergen Reactions  . Erythromycin Base Other (See Comments)    Reaction:  Fever   . Rosiglitazone Maleate Swelling  . Codeine Sulfate Rash  . Tetanus-Diphtheria Toxoids Td Rash and Other (See Comments)    Reaction:  Fever     VITALS:  Blood pressure 125/61, pulse 103, temperature 98.1 F (36.7 C), temperature source Oral, resp. rate 18, height 5' 3"  (1.6 m), weight 60.328 kg (133 lb), SpO2 100 %.  PHYSICAL EXAMINATION:  GENERAL:  59 y.o.-year-old patient lying in the bed with no acute distress.  EYES: Pupils equal, round, reactive to light and accommodation. No scleral icterus. Extraocular muscles intact.  HEENT: Head atraumatic, normocephalic. Oropharynx and nasopharynx clear. Make his membranes are dry NECK:  Supple, no jugular venous distention. No thyroid enlargement, no tenderness.  LUNGS: Normal breath sounds bilaterally, no wheezing, rales,rhonchi or crepitation. No use of accessory muscles of respiration.  CARDIOVASCULAR: S1, S2 normal. No murmurs, rubs, or gallops. Tachycardic, irregular ABDOMEN: Guarding, very tender throughout, nondistended, bowel sounds are decreased, no organomegaly or mass noted, bowel movement noted EXTREMITIES: No pedal edema, cyanosis, or clubbing.   NEUROLOGIC: Cranial nerves II through XII are intact. Muscle strength 4 /5 in all extremities. Very slow to answer questions and follow commands. One word yes or no answers only PSYCHIATRIC: The patient is alert but disoriented and with psychomotor retardation SKIN: No obvious rash, lesion, or ulcer.  bruises are noticed on her skin on her  extremities  LABORATORY PANEL:   CBC  Recent Labs Lab 07/07/15 0434  WBC 21.0*  HGB 12.7  HCT 38.3  PLT 97*   ------------------------------------------------------------------------------------------------------------------  Chemistries   Recent Labs Lab 07/06/15 0414  07/07/15 0434  NA 139  < > 139  K 2.9*  < > 3.9  CL 109  < > 113*  CO2 19*  < > 21*  GLUCOSE 284*  < > 313*  BUN 21*  < > 25*  CREATININE 0.94  < > 0.93  CALCIUM 8.5*  < > 8.3*  MG 1.6*  --   --   AST  --   < > 43*  ALT  --   < > 63*  ALKPHOS  --   < > 81  BILITOT  --   < > 0.7  < > = values in this interval not displayed. ------------------------------------------------------------------------------------------------------------------  Cardiac Enzymes No results for input(s): TROPONINI in the last 168 hours. ------------------------------------------------------------------------------------------------------------------  RADIOLOGY:  Dg Abd 1 View  07/06/2015  CLINICAL DATA:  Abdominal pain with blood in stool EXAM: ABDOMEN - 1 VIEW COMPARISON:  CT abdomen and pelvis July 03, 2015 FINDINGS: There is moderate stool in the colon. There is no bowel dilatation or air-fluid level suggesting obstruction. No free air is seen on this supine examination. There are phleboliths in the pelvis. There is lumbar levoscoliosis. IMPRESSION: Bowel gas pattern unremarkable.  Moderate stool in colon. Electronically Signed   By: Lowella Grip III M.D.   On: 07/06/2015 15:57   Ct Head Wo Contrast  07/06/2015  CLINICAL DATA:  Altered mental status. EXAM: CT HEAD  WITHOUT CONTRAST TECHNIQUE: Contiguous axial images were obtained from the base of the skull through the vertex without intravenous contrast. COMPARISON:  CT scan of May 27, 2015. FINDINGS: Bony calvarium appears intact. Mild chronic ischemic white matter disease is noted. No mass effect or midline shift is noted. Ventricular size is within normal  limits. There is no evidence of mass lesion, hemorrhage or acute infarction. IMPRESSION: Mild chronic ischemic white matter disease. No acute intracranial abnormality seen. Electronically Signed   By: Marijo Conception, M.D.   On: 07/06/2015 09:27   Dg Chest Port 1 View  07/06/2015  CLINICAL DATA:  Patient has developing altered mental status, bloody stools and unspecified abdominal pain that began today. EXAM: PORTABLE CHEST 1 VIEW COMPARISON:  06/02/2015 FINDINGS: The heart size and mediastinal contours are within normal limits. Both lungs are clear. The visualized skeletal structures are unremarkable. IMPRESSION: No active disease. Electronically Signed   By: Lajean Manes M.D.   On: 07/06/2015 15:58    EKG:   Orders placed or performed during the hospital encounter of 06/02/15  . EKG 12-Lead  . EKG 12-Lead  . ED EKG within 10 minutes  . ED EKG within 10 minutes  . EKG    ASSESSMENT AND PLAN:   59 year old Caucasian female history of type 2 diabetes non-insulin-requiring as was history of uterine cancer presenting with abdominal pain  1. Altered mental status: Etiology unclear - Likely a metabolic encephalopathy possibly due to infection - CT of the head is normal, ABG normal, ammonia level normal, no focal neurologic defects  #2: Gastrointestinal hemorrhage: Patient had several episodes of bright red blood per rectum yesterday. She was examined by GI. Likely trauma from lactulose enemas. This has stopped. Hemoglobin did drop from 14-12. No further signs of bleeding. Continue PPI. Enemas have been discontinued. I will re-start elequis  3. Concern for gastric neoplasm: Unlikely - History of uterine cancer - CT scan showing a thickening of GE junction and cardia of the stomach, on review of older scans this is not a new finding - GI following, no EGD at this time, will need to hold elequis prior to any procedure - Oncology consultation appreciated    #3 cirrhosis - Etiology unclear,  her husband is an alcoholic - Has had elevated ammonia levels during this admission. Continue lactulose - Initial transaminases elevated, now stable, hold statin  4. Paroxysmal atrial fibrillation:  - Currently A. fib with RVR. Continue with metoprolol, Amiodarone, and apixaban - Initiate telemetry monitoring  5. Type 2 diabetes non-insulin-requiring:  - Hold oral agents continue insulin sliding scale, low-dose Levemir started due to elevated blood sugars  6. Right and left elbow pain status post fall likely secondary to problem #1 with dehydration - x-rays of both elbows are negative F/u with PT consult   #7 Clostridium difficile: Her C. difficile screen is equivocal. Her white count is very elevated. I have initiated Flagyl for likely Clostridium difficile infection. Contact precautions.   CODE STATUS: Full code. Attempting to contact family today to discuss  TOTAL TIME TAKING CARE OF THIS PATIENT: 35 minutes. Care plan discussed with the patient and with nursing. I have attempted to contact her husband but there is no answer.  POSSIBLE D/C IN 2-3 DAYS, DEPENDING ON CLINICAL CONDITION.   Myrtis Ser M.D on 07/07/2015 at 10:51 AM  Between 7am to 6pm - Pager - [phone] After 6pm go to [email or web address] - [facility]  Office  [phone]  CC: Primary care physician; Sheral Flow, NP

## 2015-07-07 NOTE — Progress Notes (Signed)
Spoke with patient's husband Jessia Kief at [phone]. He states that she became confused Monday afternoon while he was visiting and that she has been confused during hospitalizations before. She does not drink alcohol regularly at home, but does take pain medication and is secretive about doses.  She will not let him be involved in her pain management. She does have some confusion at home. Spends most of the time in bed due to back pain. She may have a family history of non alcoholic cirrhosis in one of her brothers.  For now he would like her to be a full code. He will try to meet with me tomorrow for further discussion. Of note this is her 8th hospitalization of 2016.

## 2015-07-07 NOTE — Progress Notes (Signed)
Nutrition Follow-up  DOCUMENTATION CODES:   Non-severe (moderate) malnutrition in context of chronic illness but likely progressing to severe malnutrition  INTERVENTION:   Coordination of Care: await diet progression as medically able. RN Daleen Snook reports offering supplement with medications, however minimal to intake currently. Pt currently a feeder if alert enough to take po. If pt remains unable to safely take po and/or note meeting nutritional needs, may need to consider nutrition support; will follow poc. Will also recommend new weight. Medical Food Supplement Therapy: Continue as ordered currently and offer it pt awake enough to take safely.   NUTRITION DIAGNOSIS:   Inadequate oral intake related to nausea, vomiting as evidenced by per patient/family report.  GOAL:   Patient will meet greater than or equal to 90% of their needs; ongoing  MONITOR:    (Energy Intake, Glucose Profile, Anthropometrics, Digestive system)  REASON FOR ASSESSMENT:   Malnutrition Screening Tool    ASSESSMENT:   Pt n/v resolved currently. Pt had bright red blood from rectum yesterday, likely secondary to lactulose enemas, currently no bleeding. Per GI note, pt with thickening pf GE junction and proximal stomach with past EGD consistent with Nissen fundoplication; no current plan for EGD secondary to AMS. Pt on isolation for questionable c.diff. Pt currently lethargic and somewhat nonverbal.  Diet Order:  Diet clear liquid Room service appropriate?: Yes; Fluid consistency:: Thin    Current Nutrition: Pt taking sips at best with assistance today. Pt did take pills with applesauce ok this am per Nsg. Recorded po intake of CL at the beginning of admission 30% of trays. NPO/CL day 4 with poor po intake 3 weeks PTA per pt report on admission.   Gastrointestinal Profile: Last BM: 07/06/2015, red BM and bright red blood recorded.   Scheduled Medications:  . amiodarone  200 mg Oral Daily  . apixaban  5  mg Oral BID  . citalopram  20 mg Oral Daily  . dicyclomine  10 mg Oral TID PC & HS  . feeding supplement  1 Container Oral TID WC  . insulin aspart  0-15 Units Subcutaneous TID WC  . insulin aspart  0-5 Units Subcutaneous QHS  . [START ON 07/08/2015] insulin detemir  10 Units Subcutaneous Daily  . insulin detemir  5 Units Subcutaneous Once  . lactulose  20 g Oral Daily  . lisinopril  5 mg Oral Daily  . metoprolol succinate  25 mg Oral Daily  . metroNIDAZOLE  500 mg Oral 3 times per day  . midodrine  10 mg Oral TID WC  . pantoprazole  40 mg Oral BID  . piperacillin-tazobactam (ZOSYN)  IV  4.5 g Intravenous 3 times per day  . potassium chloride  40 mEq Oral BID  . vancomycin  750 mg Intravenous Q18H    Continuous Medications:  . sodium chloride 100 mL/hr at 07/07/15 1040     Electrolyte/Renal Profile and Glucose Profile:   Recent Labs Lab 07/06/15 0414 07/06/15 1451 07/07/15 0434  NA 139 138 139  K 2.9* 3.7 3.9  CL 109 110 113*  CO2 19* 20* 21*  BUN 21* 23* 25*  CREATININE 0.94 0.97 0.93  CALCIUM 8.5* 8.4* 8.3*  MG 1.6*  --   --   GLUCOSE 284* 295* 313*   Protein Profile:   Recent Labs Lab 07/03/15 2147 07/06/15 0813 07/07/15 0434  ALBUMIN 4.2 3.0* 2.6*    Weight Trend since Admission: Filed Weights   07/03/15 2143  Weight: 133 lb (60.328 kg)  BMI:  Body mass index is 23.57 kg/(m^2).  Estimated Nutritional Needs:   Kcal:  BEE: 1134kcals, TEE: (IF 1.1-1.3)(AF 1.2)  1500-1769kcals  Protein:  60-72g protein (1.0-1.2g/kg)  Fluid:  1500-1849m of fluid (25-370mkg)  EDUCATION NEEDS:   Education needs no appropriate at this time   HIReadlynRD, LDN Pager [phone]

## 2015-07-07 NOTE — Plan of Care (Signed)
Problem: Discharge Progression Outcomes Goal: Tolerating diet Outcome: Not Applicable Date Met:  67/20/94 Did not occur on this shift Goal: Other Discharge Outcomes/Goals Outcome: Not Progressing Pt disoriented x 4. Non verbal. No s/sx of pain or discomfort. Incontinent of urine. Worked up yesterday d/t change in LOC,bloody stools along with abnormal vital signs. No nausea, vomiting or bloody stools this shift. Pt does not appear to be in any distress.

## 2015-07-08 LAB — STOOL CULTURE
Culture: NOT DETECTED
Special Requests: NORMAL

## 2015-07-08 LAB — BASIC METABOLIC PANEL
Anion gap: 5 (ref 5–15)
BUN: 23 mg/dL — ABNORMAL HIGH (ref 6–20)
CALCIUM: 8.3 mg/dL — AB (ref 8.9–10.3)
CHLORIDE: 115 mmol/L — AB (ref 101–111)
CO2: 21 mmol/L — AB (ref 22–32)
CREATININE: 0.76 mg/dL (ref 0.44–1.00)
GFR calc Af Amer: >60 mL/min (ref >60)
GFR calc non Af Amer: >60 mL/min (ref >60)
GLUCOSE: 173 mg/dL — AB (ref 65–99)
Potassium: 4 mmol/L (ref 3.5–5.1)
Sodium: 141 mmol/L (ref 135–145)

## 2015-07-08 LAB — HEPATIC FUNCTION PANEL
ALBUMIN: 2.4 g/dL — AB (ref 3.5–5.0)
ALK PHOS: 76 U/L (ref 38–126)
ALT: 54 U/L (ref 14–54)
AST: 32 U/L (ref 15–41)
BILIRUBIN INDIRECT: 0.2 mg/dL — AB (ref 0.3–0.9)
BILIRUBIN TOTAL: 0.4 mg/dL (ref 0.3–1.2)
Bilirubin, Direct: 0.2 mg/dL (ref 0.1–0.5)
TOTAL PROTEIN: 4.8 g/dL — AB (ref 6.5–8.1)

## 2015-07-08 LAB — CBC
HEMATOCRIT: 34.7 % — AB (ref 35.0–47.0)
HEMOGLOBIN: 11.2 g/dL — AB (ref 12.0–16.0)
MCH: 31.6 pg (ref 26.0–34.0)
MCHC: 32.2 g/dL (ref 32.0–36.0)
MCV: 98.1 fL (ref 80.0–100.0)
Platelets: 89 10*3/uL — ABNORMAL LOW (ref 150–440)
RBC: 3.54 MIL/uL — ABNORMAL LOW (ref 3.80–5.20)
RDW: 16.7 % — AB (ref 11.5–14.5)
WBC: 19.5 10*3/uL — ABNORMAL HIGH (ref 3.6–11.0)

## 2015-07-08 LAB — GLUCOSE, CAPILLARY
GLUCOSE-CAPILLARY: 171 mg/dL — AB (ref 65–99)
Glucose-Capillary: 168 mg/dL — ABNORMAL HIGH (ref 65–99)
Glucose-Capillary: 154 mg/dL — ABNORMAL HIGH (ref 65–99)
Glucose-Capillary: 241 mg/dL — ABNORMAL HIGH (ref 65–99)

## 2015-07-08 MED ORDER — INSULIN DETEMIR 100 UNIT/ML ~~LOC~~ SOLN
12.0000 [IU] | Freq: Every day | SUBCUTANEOUS | Status: DC
  Administered 2015-07-09 – 2015-07-10 (×2): 12 [IU] via SUBCUTANEOUS
  Filled 2015-07-08 (×2): qty 0.12

## 2015-07-08 MED ORDER — IBUPROFEN 600 MG PO TABS
600.0000 mg | ORAL_TABLET | Freq: Four times a day (QID) | ORAL | Status: DC | PRN
  Administered 2015-07-08 – 2015-07-10 (×4): 600 mg via ORAL
  Filled 2015-07-08 (×4): qty 1

## 2015-07-08 MED ORDER — CIPROFLOXACIN HCL 500 MG PO TABS
500.0000 mg | ORAL_TABLET | Freq: Two times a day (BID) | ORAL | Status: DC
  Administered 2015-07-08 – 2015-07-11 (×7): 500 mg via ORAL
  Filled 2015-07-08 (×7): qty 1

## 2015-07-08 MED ORDER — PIPERACILLIN-TAZOBACTAM 3.375 G IVPB
3.3750 g | Freq: Three times a day (TID) | INTRAVENOUS | Status: DC
  Filled 2015-07-08 (×2): qty 50

## 2015-07-08 NOTE — Progress Notes (Signed)
ANTIBIOTIC CONSULT NOTE - INITIAL  Pharmacy Consult for Vancomycin/Zosyn  Indication: rule out sepsis  Allergies  Allergen Reactions  . Erythromycin Base Other (See Comments)    Reaction:  Fever   . Rosiglitazone Maleate Swelling  . Codeine Sulfate Rash  . Tetanus-Diphtheria Toxoids Td Rash and Other (See Comments)    Reaction:  Fever     Patient Measurements: Height: 5' 3"  (160 cm) Weight: 134 lb 1.6 oz (60.827 kg) IBW/kg (Calculated) : 52.4 Adjusted Body Weight: 55.6 kg   Vital Signs: Temp: 97.8 F (36.6 C) (10/28 0514) Temp Source: Oral (10/28 0514) BP: 152/73 mmHg (10/28 0514) Pulse Rate: 74 (10/28 0514) Intake/Output from previous day: 10/27 0701 - 10/28 0700 In: 1106.7 [I.V.:1006.7; IV Piggyback:100] Out: -  Intake/Output from this shift:    Labs:  Recent Labs  07/06/15 0414  07/06/15 1451 07/07/15 0434 07/08/15 0647  WBC  --   < > 22.7* 21.0* 19.5*  HGB  --   < > 14.0 12.7 11.2*  PLT  --   < > 108* 97* 89*  CREATININE 0.94  --  0.97 0.93  --   < > = values in this interval not displayed. Estimated Creatinine Clearance: 52.5 mL/min (by C-G formula based on Cr of 0.93). No results for input(s): VANCOTROUGH, VANCOPEAK, VANCORANDOM, GENTTROUGH, GENTPEAK, GENTRANDOM, TOBRATROUGH, TOBRAPEAK, TOBRARND, AMIKACINPEAK, AMIKACINTROU, AMIKACIN in the last 72 hours.   Microbiology: Recent Results (from the past 720 hour(s))  Stool culture     Status: None (Preliminary result)   Collection Time: 07/06/15  9:53 AM  Result Value Ref Range Status   Specimen Description STOOL  Final   Special Requests Normal  Final   Culture   Final    NO CAMPYLOBACTER DETECTED HOLDING FOR POSSIBLE PATHOGEN No Pathogenic E. coli detected    Report Status PENDING  Incomplete  C difficile quick scan w PCR reflex     Status: Abnormal   Collection Time: 07/06/15  9:53 AM  Result Value Ref Range Status   C Diff antigen POSITIVE (A) NEGATIVE Final   C Diff toxin NEGATIVE NEGATIVE  Final   C Diff interpretation   Final    Negative for toxigenic C. difficile. Toxin gene and active toxin production not detected. May be a nontoxigenic strain of C. difficile bacteria present, lacking the ability to produce toxin.  Clostridium Difficile by PCR     Status: None   Collection Time: 07/06/15  9:53 AM  Result Value Ref Range Status   Toxigenic C Difficile by pcr  NEGATIVE Final  Culture, blood (routine x 2)     Status: None (Preliminary result)   Collection Time: 07/06/15 10:50 AM  Result Value Ref Range Status   Specimen Description BLOOD LEFT ASSIST CONTROL  Final   Special Requests BOTTLES DRAWN AEROBIC AND ANAEROBIC [place]  Final   Culture NO GROWTH 1 DAY  Final   Report Status PENDING  Incomplete  Culture, blood (routine x 2)     Status: None (Preliminary result)   Collection Time: 07/06/15  2:52 PM  Result Value Ref Range Status   Specimen Description BLOOD RIGHT ASSIST CONTROL  Final   Special Requests BAA,ANA,AER,5ML  Final   Culture NO GROWTH 1 DAY  Final   Report Status PENDING  Incomplete    Medical History: Past Medical History  Diagnosis Date  . Type 1 diabetes ([place])   . Hypothyroid   . Degenerative disk disease   . Stomach ulcer   . Diverticulitis   .  Syncope 01/2015  . Anxiety   . GERD (gastroesophageal reflux disease)   . History of hiatal hernia   . Cancer ([facility])     HX OF CANCER OF UTERUS   . TIA (transient ischemic attack) 02/2015  . Hypertension   . Pancreatitis   . PAF (paroxysmal atrial fibrillation) ([place]) 03/2015    a. new onset 03/2015 in setting of intractable N/V; b. on Eliquis 5 mg bid; c. CHADSVASc 4 (DM, TIA x 2, female)  . Intussusception intestine ([place]) 05/2015    Medications:  Prescriptions prior to admission  Medication Sig Dispense Refill Last Dose  . amiodarone (PACERONE) 200 MG tablet Take 1 tablet (200 mg total) by mouth daily. 30 tablet 0 07/03/2015 at 1100  . apixaban (ELIQUIS) 5 MG TABS tablet Take 1 tablet (5 mg total) by  mouth 2 (two) times daily. 60 tablet 0 07/03/2015 at 1100  . aspirin EC 325 MG tablet Take 325 mg by mouth daily.   07/03/2015 at 1100  . atorvastatin (LIPITOR) 40 MG tablet Take 1 tablet (40 mg total) by mouth daily at 6 PM. 30 tablet 0 07/03/2015 at 1100  . citalopram (CELEXA) 20 MG tablet Take 20 mg by mouth daily.   07/03/2015 at 1100  . dicyclomine (BENTYL) 10 MG capsule Take 1 capsule by mouth 4 (four) times daily - after meals and at bedtime.    07/03/2015 at 1100  . JANUVIA 100 MG tablet Take 100 mg by mouth daily.   07/03/2015 at 1100  . lactulose (CHRONULAC) 10 GM/15ML solution Take 30 mLs (20 g total) by mouth 2 (two) times daily as needed for mild constipation. 240 mL 0 07/03/2015 at 1100  . lisinopril (PRINIVIL,ZESTRIL) 5 MG tablet Take 1 tablet by mouth daily.   07/03/2015 at 1100  . metFORMIN (GLUCOPHAGE) 1000 MG tablet Take 1 tablet by mouth 2 (two) times daily.   07/03/2015 at 1100  . metoprolol succinate (TOPROL-XL) 25 MG 24 hr tablet Take 1 tablet (25 mg total) by mouth daily. 30 tablet 0 07/03/2015 at 1100  . midodrine (PROAMATINE) 10 MG tablet Take 1 tablet (10 mg total) by mouth 3 (three) times daily with meals. 90 tablet 0 07/03/2015 at 1100  . morphine (MS CONTIN) 60 MG 12 hr tablet Take 1 tablet (60 mg total) by mouth 2 (two) times daily. 20 tablet 0 07/03/2015 at 1100  . omeprazole (PRILOSEC) 20 MG capsule Take 20 mg by mouth daily.   07/03/2015 at 1100  . promethazine (PHENERGAN) 25 MG tablet Take 25 mg by mouth every 6 (six) hours as needed for nausea or vomiting.   07/03/2015 at 1100  . psyllium (METAMUCIL) 58.6 % packet Take 1 packet by mouth every other day.   07/03/2015 at 1100  . tiZANidine (ZANAFLEX) 4 MG tablet Take 4 mg by mouth every 6 (six) hours.   07/03/2015 at 1100  . cefUROXime (CEFTIN) 500 MG tablet Take 1 tablet (500 mg total) by mouth 2 (two) times daily with a meal. (Patient not taking: Reported on 07/05/2015) 14 tablet 0   . pantoprazole (PROTONIX) 40  MG tablet Take 1 tablet (40 mg total) by mouth 2 (two) times daily. (Patient not taking: Reported on 07/05/2015) 60 tablet 0    Assessment: Patient is a 59 yo female admitted with N/V and now with AMS.  Ruling out sepsis as patient's WBC trending up with unknown source.  MD treating C.Diff as patient is symptomatic and aware that PCR is negative.  Currently patient is ordered Vancomycin 750 mg IV q18h, Zosyn 4.5 gm IV q8h and Metronidazole 500 mg po q8h.   SCr: 0.93 (10/27), est CrCl~52.5 mL/min, ke: 0.051, t1/2: 13.6 h, VD: 39.2 L  Goal of Therapy:  Vancomycin trough level 15-20 mcg/ml  Plan:  Will continue Vancomycin 750 mg IV q18h based on above estimated kinetics.  Trough prior to dose on 10/30 at 0330.  Will transition patient to Zosyn 3.375 gm IV q8h as blood cultures are NGTD and patient has received ~48 hours of therapy.   Pharmacy will continue to follow.  Kris No G 07/08/2015,8:28 AM

## 2015-07-08 NOTE — Consult Note (Signed)
Subjective: Patient seen for nausea vomiting and abdominal pain. Patient seems more alert today but still is not quite clear with some questions. Cooperation on examination is present but ability to follow commands is still poor. Minimal nausea today no emesis. Continues with generalized abdominal pain  Objective: Vital signs in last 24 hours: Temp:  [97.8 F (36.6 C)-99.3 F (37.4 C)] 99.3 F (37.4 C) (10/28 0905) Pulse Rate:  [69-74] 69 (10/28 0905) Resp:  [16-18] 18 (10/28 0905) BP: (147-155)/(58-78) 147/78 mmHg (10/28 0905) SpO2:  [100 %] 100 % (10/28 0905) Weight:  [60.827 kg (134 lb 1.6 oz)] 60.827 kg (134 lb 1.6 oz) (10/28 0500) Blood pressure 147/78, pulse 69, temperature 99.3 F (37.4 C), temperature source Oral, resp. rate 18, height 5' 3"  (1.6 m), weight 60.827 kg (134 lb 1.6 oz), SpO2 100 %.   Intake/Output from previous day: 10/27 0701 - 10/28 0700 In: 1106.7 [I.V.:1006.7; IV Piggyback:100] Out: -   Intake/Output this shift: Total I/O In: 490 [I.V.:390; IV Piggyback:100] Out: -    General appearance:  Elderly appearing 59 year old female no acute distress Resp:  Clear to auscultation Cardio:  Regular rate and rhythm GI:  Soft nondistended bowel sounds are positive. She is markedly tender to palpation left lower quadrant extending toward the midline there is some mild tenderness in the epigastric region. No rebound. Extremities:  No clubbing cyanosis or edema   Lab Results: Results for orders placed or performed during the hospital encounter of 07/03/15 (from the past 24 hour(s))  Glucose, capillary     Status: Abnormal   Collection Time: 07/07/15  9:23 PM  Result Value Ref Range   Glucose-Capillary 205 (H) 65 - 99 mg/dL   Comment 1 Notify RN   CBC     Status: Abnormal   Collection Time: 07/08/15  6:47 AM  Result Value Ref Range   WBC 19.5 (H) 3.6 - 11.0 K/uL   RBC 3.54 (L) 3.80 - 5.20 MIL/uL   Hemoglobin 11.2 (L) 12.0 - 16.0 g/dL   HCT 34.7 (L) 35.0 - 47.0  %   MCV 98.1 80.0 - 100.0 fL   MCH 31.6 26.0 - 34.0 pg   MCHC 32.2 32.0 - 36.0 g/dL   RDW 16.7 (H) 11.5 - 14.5 %   Platelets 89 (L) 150 - 440 K/uL  Basic metabolic panel     Status: Abnormal   Collection Time: 07/08/15  6:47 AM  Result Value Ref Range   Sodium 141 135 - 145 mmol/L   Potassium 4.0 3.5 - 5.1 mmol/L   Chloride 115 (H) 101 - 111 mmol/L   CO2 21 (L) 22 - 32 mmol/L   Glucose, Bld 173 (H) 65 - 99 mg/dL   BUN 23 (H) 6 - 20 mg/dL   Creatinine, Ser 0.76 0.44 - 1.00 mg/dL   Calcium 8.3 (L) 8.9 - 10.3 mg/dL   GFR calc non Af Amer >60 >60 mL/min   GFR calc Af Amer >60 >60 mL/min   Anion gap 5 5 - 15  Hepatic function panel     Status: Abnormal   Collection Time: 07/08/15  6:47 AM  Result Value Ref Range   Total Protein 4.8 (L) 6.5 - 8.1 g/dL   Albumin 2.4 (L) 3.5 - 5.0 g/dL   AST 32 15 - 41 U/L   ALT 54 14 - 54 U/L   Alkaline Phosphatase 76 38 - 126 U/L   Total Bilirubin 0.4 0.3 - 1.2 mg/dL   Bilirubin, Direct 0.2 0.1 -  0.5 mg/dL   Indirect Bilirubin 0.2 (L) 0.3 - 0.9 mg/dL  Glucose, capillary     Status: Abnormal   Collection Time: 07/08/15  7:39 AM  Result Value Ref Range   Glucose-Capillary 168 (H) 65 - 99 mg/dL  Glucose, capillary     Status: Abnormal   Collection Time: 07/08/15 11:37 AM  Result Value Ref Range   Glucose-Capillary 171 (H) 65 - 99 mg/dL  Glucose, capillary     Status: Abnormal   Collection Time: 07/08/15  4:50 PM  Result Value Ref Range   Glucose-Capillary 154 (H) 65 - 99 mg/dL      Recent Labs  07/06/15 1451 07/07/15 0434 07/08/15 0647  WBC 22.7* 21.0* 19.5*  HGB 14.0 12.7 11.2*  HCT 42.6 38.3 34.7*  PLT 108* 97* 89*   BMET  Recent Labs  07/06/15 1451 07/07/15 0434 07/08/15 0647  NA 138 139 141  K 3.7 3.9 4.0  CL 110 113* 115*  CO2 20* 21* 21*  GLUCOSE 295* 313* 173*  BUN 23* 25* 23*  CREATININE 0.97 0.93 0.76  CALCIUM 8.4* 8.3* 8.3*   LFT  Recent Labs  07/08/15 0647  PROT 4.8*  ALBUMIN 2.4*  AST 32  ALT 54   ALKPHOS 76  BILITOT 0.4  BILIDIR 0.2  IBILI 0.2*   PT/INR No results for input(s): LABPROT, INR in the last 72 hours. Hepatitis Panel No results for input(s): HEPBSAG, HCVAB, HEPAIGM, HEPBIGM in the last 72 hours. C-Diff  Recent Labs  07/06/15 0953  CDIFFTOX NEGATIVE   No results for input(s): CDIFFPCR in the last 72 hours.   Studies/Results: No results found.  Scheduled Inpatient Medications:   . amiodarone  200 mg Oral Daily  . apixaban  5 mg Oral BID  . ciprofloxacin  500 mg Oral BID  . citalopram  20 mg Oral Daily  . dicyclomine  10 mg Oral TID PC & HS  . feeding supplement  1 Container Oral TID WC  . insulin aspart  0-15 Units Subcutaneous TID WC  . insulin aspart  0-5 Units Subcutaneous QHS  . [START ON 07/09/2015] insulin detemir  12 Units Subcutaneous Daily  . lactulose  20 g Oral Daily  . lisinopril  5 mg Oral Daily  . metoprolol succinate  25 mg Oral Daily  . metroNIDAZOLE  500 mg Oral 3 times per day  . midodrine  10 mg Oral TID WC  . pantoprazole  40 mg Oral BID  . potassium chloride  40 mEq Oral BID    Continuous Inpatient Infusions:   . sodium chloride 100 mL/hr at 07/08/15 1057    PRN Inpatient Medications:  acetaminophen **OR** acetaminophen, hydrALAZINE, ondansetron **OR** ondansetron (ZOFRAN) IV  Miscellaneous:   Assessment:  1. Altered mental status likely multifactorial in consideration of possible sepsis and hyperammonemia. Slow improvement. 2. Abdominal pain of uncertain etiology. Please see review of previous CT scans in regards to the distal esophageal appearance on imaging. Most of her discomfort is centered in the left lower quadrant more so consistent with an episode of diverticulitis. She is currently on appropriate antibiotics in that regard.  Plan:  1. Continue current as you are. As mental status improves can consider EGD for repeat evaluation of the region of the GE junction. If again showing only evidence of Nissen  fundoplication and this would account for the imaging pictures noted. Dr. Candace Cruise to follow over the weekend.  Lollie Sails MD 07/08/2015, 6:37 PM

## 2015-07-08 NOTE — Plan of Care (Signed)
Problem: Discharge Progression Outcomes Goal: Other Discharge Outcomes/Goals Outcome: Progressing Plan of care progress to goal for: 1. Discharge Plan:         Possible Plan to Discharge to [facility] Monday. 2. Pain:         Denies pain. 3. Hemodynamically Stable:         VSS.         Afebrile.         Remains on IVF's.         Remains on Oral Antibiotics.           4. Complications:         Disoriented to Situation. Disoriented to Time. Nonverbal at Times.         Incontinent of Urine. 5. Diet:         Clear Liquid. Decreased Appetite. Only taking Sips.         Supplement [employer] provided. Encourage. Takes Sips Only. 6. Activity:         Two Person Assist.

## 2015-07-08 NOTE — Plan of Care (Addendum)
Problem: Discharge Progression Outcomes Goal: Other Discharge Outcomes/Goals Outcome: Progressing Pt is lethargic, arouses to voice, then drifts off afterwards. Pt does not speak or answer questions. Not aware enough to take PO medications. Remains incontinent, NSR on telemetry, VSS.

## 2015-07-08 NOTE — Progress Notes (Signed)
Physical Therapy Treatment Patient Details Name: [name] MRN: [id number] DOB: [birth year] Today's Date: 07/08/2015    History of Present Illness Pt admitted with intractable nausea and vomiting as well as concern for gastric neoplasm. Pt also complaining of R and L elbow pain secondary to recent fall but radiographs were negative for fracture. Pt with history of frequent falls at home. PMH - syncope, HTN, chronic pain, afib, HTN, uterine CA. Pt is somewhat confused today and RN is aware and has spoken with GI who ordered a stat ammonia on her. Family reports 5 falls in the last 12 months and 3 in the last "couple months." Pt has 24/7 assistance at home from husband and sons. She ambulates with hand held assist and infrequent use of rolling walker    PT Comments    Pt appears to be more verbal today, however her cognition is a bit inconsistent and makes for somewhat unreliable testing at times. Pt potentially has L sided weakness, although very minor and seemingly not affecting functional strength. She demonstrates clonus in L plantar flexors and tone (2 ashworth) in L elbow flexors (reference general exercises section for further neuro testing). Pt able to perform bed mobility, transfers, and ambulation at min assist and simple verbal and visual cues to perform tasks. Although pt is not very verbal, she is very pleasant to work with and willing to participate in therapy tasks. Due to her strength, mobility, and potential neurological deficits, she will continue to benefit from skilled PT in order to return her to optimal PLOF   Follow Up Recommendations  SNF     Equipment Recommendations  None recommended by PT    Recommendations for Other Services       Precautions / Restrictions Precautions Precautions: Fall Precaution Comments: Enteric isolation Restrictions Weight Bearing Restrictions: No    Mobility  Bed Mobility Overal bed mobility: Needs Assistance Bed Mobility: Supine  to Sit     Supine to sit: Min assist     General bed mobility comments: Pt needs a visual cue to sit EOB with therapist. She requires increased time and assist for trunk, although she generally has fair strength with this functional task.   Transfers Overall transfer level: Needs assistance Equipment used: Rolling walker (2 wheeled) Transfers: Sit to/from Stand Sit to Stand: Min assist         General transfer comment: Pt needs minor assist for getting into standing and cues for hand placement. Pt initially has R lateral lean that is correctable with therapist cue. Pt stable in standing with no buckling or LOB  Ambulation/Gait Ambulation/Gait assistance: Min assist Ambulation Distance (Feet): 6 Feet (F/B) Assistive device: Rolling walker (2 wheeled) Gait Pattern/deviations: Step-to pattern;Decreased step length - right;Decreased step length - left;Decreased stride length;Shuffle Gait velocity: severely decreased Gait velocity interpretation: <1.8 ft/sec, indicative of risk for recurrent falls General Gait Details: Pt needs assist for advancing RW. Even with cues her steps remain very short and shuffle-like. After 5 ft of ambulation pt c/o not feeling well when asked by therapist, so she was sat down. She was non-symptomatic for any orthostatic hypotension as far as therapist could tell. Pt in NAD once in sitting.    Stairs            Wheelchair Mobility    Modified Rankin (Stroke Patients Only)       Balance Overall balance assessment: Needs assistance Sitting-balance support: Single extremity supported Sitting balance-Leahy Scale: Good     Standing balance  support: Bilateral upper extremity supported Standing balance-Leahy Scale: Fair Standing balance comment: R lateral lean that is correctable. No overt LOBs                    Cognition Arousal/Alertness: Awake/alert Behavior During Therapy: Flat affect Overall Cognitive Status: Difficult to assess  (A&O x 4)     Current Attention Level: Alternating   Following Commands: Follows one step commands inconsistently     Problem Solving: Slow processing;Decreased initiation;Difficulty sequencing;Requires verbal cues (requires visual cues) General Comments: Pt verbal if given simple commands and questions    Exercises Other Exercises Other Exercises: Pt tested positive for both clonus in left ankle PFs and tone in L elbow flexors. Given sensation testing, she apparently has intact light touch bilaterally, however testing is suspect due to cognition. She is able to track pencil with eyes but not able to continually attend to task for any conclusive results of visual testing. Pt has labored finger-to-nose bilaterally, but is able to at least complete the task on her R side. On the L side her finger-to-nose testing is not performed completely, as pt continually switches back to the R hand, possibly because it's easier on that side.     General Comments        Pertinent Vitals/Pain Pain Assessment: No/denies pain    Home Living                      Prior Function            PT Goals (current goals can now be found in the care plan section) Acute Rehab PT Goals Patient Stated Goal: none stated PT Goal Formulation: With patient/family Time For Goal Achievement: 07/18/15 Potential to Achieve Goals: Fair Progress towards PT goals: Progressing toward goals    Frequency  Min 2X/week    PT Plan Current plan remains appropriate    Co-evaluation             End of Session Equipment Utilized During Treatment: Gait belt Activity Tolerance: Patient limited by fatigue Patient left: in bed;with call bell/phone within reach;with bed alarm set;with nursing/sitter in room     Time: 1016-1040 PT Time Calculation (min) (ACUTE ONLY): 24 min  Charges:                       G CodesJanyth Contes 2015/07/13, 11:46 AM  Janyth Contes, SPT. [phone]

## 2015-07-08 NOTE — NC FL2 (Signed)
[place] LEVEL OF CARE SCREENING TOOL     IDENTIFICATION  Patient Name: [name] Birthdate: 02-Feb-1956 Sex: female Admission Date (Current Location): 07/03/2015  [place] and [place] Number:  Set designer)   Facility and Address:  [facility], [address] [place], [place] [address]      Provider Number: [id number]  Attending Physician Name and Address:  Aldean Jewett, MD  Relative Name and Phone Number:       Current Level of Care: Hospital Recommended Level of Care: [employer] Prior Approval Number:    Date Approved/Denied:   PASRR Number:    Discharge Plan: SNF    Current Diagnoses: Patient Active Problem List   Diagnosis Date Noted  . GI (gastrointestinal bleed) 07/06/2015  . Intractable nausea and vomiting 07/04/2015  . Malnutrition of moderate degree 07/04/2015  . Chest pain   . Symptomatic bradycardia 05/27/2015  . Acute encephalopathy 05/27/2015  . Elevated troponin 05/27/2015  . Intussusception intestine ([place])   . Other specified hypothyroidism   . Abdominal pain, chronic, epigastric   . Diarrhea 05/19/2015  . Chronic anemia 05/19/2015  . Thrombocytopenia ([place]) 05/19/2015  . ARF (acute renal failure) ([place]) 05/19/2015  . Prolonged QT interval   . Hypomagnesemia   . Colitis   . Narcotic abuse   . Type 2 diabetes mellitus without complication ([place])   . Syncope 05/18/2015  . PAF (paroxysmal atrial fibrillation) ([place])   . Demand ischemia ([place]) 04/06/2015  . Hypokalemia 04/06/2015  . Hyperlipidemia with target LDL less than 100 04/06/2015  . Protein-calorie malnutrition, severe ([place]) 04/05/2015  . CVA (cerebral infarction) 02/15/2015  . Dehydration 01/12/2015  . Essential hypertension 01/12/2015  . Orthostatic hypotension 01/12/2015  . Chronically on opiate therapy 01/12/2015  . Gastroparesis 01/12/2015  . DEPRESSION/ANXIETY 06/27/2007  . MYOFASCIAL PAIN SYNDROME 06/27/2007  . Chronic  pain syndrome 03/28/2007  . GERD 03/27/2007  . DIVERTICULOSIS, COLON 03/27/2007  . LUMBAR DISC DISPLACEMENT 03/27/2007  . PROTEINURIA 03/27/2007  . UTERINE CANCER, HX OF 03/27/2007    Orientation ACTIVITIES/SOCIAL BLADDER RESPIRATION    Self    Indwelling catheter Normal  BEHAVIORAL SYMPTOMS/MOOD NEUROLOGICAL BOWEL NUTRITION STATUS      Continent Diet (clear liquids)  PHYSICIAN VISITS COMMUNICATION OF NEEDS Height & Weight Skin    Verbally 5' 3"  (160 cm) 135 lbs. Normal          AMBULATORY STATUS RESPIRATION    Assist extensive Normal      Personal Care Assistance Level of Assistance  Total care       Total Care Assistance: Maximum assistance    Functional Limitations Info                SPECIAL CARE FACTORS FREQUENCY  PT (By licensed PT)     PT Frequency:  (5 times per week)             Additional Factors Info  Psychotropic, Allergies   Allergies Info:  (Allergies: Erythromycin [place], Rosiglitazone Maleate, Codeine Sulfate, Tetanus-diphtheria Toxoids Td)           Current Medications (07/08/2015): Current Facility-Administered Medications  Medication Dose Route Frequency Provider Last Rate Last Dose  . 0.9 %  sodium chloride infusion   Intravenous Continuous Lytle Butte, MD 100 mL/hr at 07/08/15 1057    . acetaminophen (TYLENOL) tablet 650 mg  650 mg Oral Q6H PRN Lytle Butte, MD   650 mg at 07/05/15 8502   Or  .  acetaminophen (TYLENOL) suppository 650 mg  650 mg Rectal Q6H PRN Lytle Butte, MD      . amiodarone (PACERONE) tablet 200 mg  200 mg Oral Daily Lytle Butte, MD   200 mg at 07/08/15 1032  . apixaban (ELIQUIS) tablet 5 mg  5 mg Oral BID Aldean Jewett, MD   5 mg at 07/08/15 1032  . ciprofloxacin (CIPRO) tablet 500 mg  500 mg Oral BID Aldean Jewett, MD   500 mg at 07/08/15 1404  . citalopram (CELEXA) tablet 20 mg  20 mg Oral Daily Aldean Jewett, MD   20 mg at 07/08/15 1031  . dicyclomine (BENTYL) capsule 10 mg  10 mg Oral  TID PC & HS Lytle Butte, MD   10 mg at 07/08/15 1256  . feeding supplement (BOOST / RESOURCE BREEZE) liquid 1 Container  1 Container Oral TID WC Nicholes Mango, MD   1 Container at 07/08/15 1256  . hydrALAZINE (APRESOLINE) injection 10 mg  10 mg Intravenous Q4H PRN Lytle Butte, MD   10 mg at 07/04/15 0405  . insulin aspart (novoLOG) injection 0-15 Units  0-15 Units Subcutaneous TID WC Nicholes Mango, MD   3 Units at 07/08/15 1257  . insulin aspart (novoLOG) injection 0-5 Units  0-5 Units Subcutaneous QHS Nicholes Mango, MD   2 Units at 07/07/15 2137  . [START ON 07/09/2015] insulin detemir (LEVEMIR) injection 12 Units  12 Units Subcutaneous Daily Aldean Jewett, MD      . lactulose (CHRONULAC) 10 GM/15ML solution 20 g  20 g Oral Daily Nicholes Mango, MD   20 g at 07/08/15 1032  . lisinopril (PRINIVIL,ZESTRIL) tablet 5 mg  5 mg Oral Daily Lytle Butte, MD   5 mg at 07/08/15 1030  . metoprolol succinate (TOPROL-XL) 24 hr tablet 25 mg  25 mg Oral Daily Lytle Butte, MD   25 mg at 07/08/15 1030  . metroNIDAZOLE (FLAGYL) tablet 500 mg  500 mg Oral 3 times per day Aldean Jewett, MD   500 mg at 07/08/15 1404  . midodrine (PROAMATINE) tablet 10 mg  10 mg Oral TID WC Lytle Butte, MD   10 mg at 07/08/15 1255  . ondansetron (ZOFRAN) tablet 4 mg  4 mg Oral Q6H PRN Lytle Butte, MD       Or  . ondansetron [facility]) injection 4 mg  4 mg Intravenous Q6H PRN Lytle Butte, MD   4 mg at 07/04/15 1013  . pantoprazole (PROTONIX) EC tablet 40 mg  40 mg Oral BID Lytle Butte, MD   40 mg at 07/08/15 1030  . potassium chloride SA (K-DUR,KLOR-CON) CR tablet 40 mEq  40 mEq Oral BID Harrie Foreman, MD   40 mEq at 07/08/15 1030   Do not use this list as official medication orders. Please verify with discharge summary.  Discharge Medications:   Medication List    TAKE these medications        promethazine 25 MG tablet  Commonly known as:  PHENERGAN  Take 25 mg by mouth every 6 (six) hours as needed for nausea  or vomiting.     promethazine 25 MG tablet  Commonly known as:  PHENERGAN  Take 1 tablet (25 mg total) by mouth every 6 (six) hours as needed for nausea or vomiting.     ranitidine 150 MG capsule  Commonly known as:  ZANTAC  Take 1 capsule (150 mg total) by mouth  2 (two) times daily.     sucralfate 1 G tablet  Commonly known as:  CARAFATE  Take 1 tablet (1 g total) by mouth 4 (four) times daily.      ASK your doctor about these medications        amiodarone 200 MG tablet  Commonly known as:  PACERONE  Take 1 tablet (200 mg total) by mouth daily.     apixaban 5 MG Tabs tablet  Commonly known as:  ELIQUIS  Take 1 tablet (5 mg total) by mouth 2 (two) times daily.     aspirin EC 325 MG tablet  Take 325 mg by mouth daily.     atorvastatin 40 MG tablet  Commonly known as:  LIPITOR  Take 1 tablet (40 mg total) by mouth daily at 6 PM.     cefUROXime 500 MG tablet  Commonly known as:  CEFTIN  Take 1 tablet (500 mg total) by mouth 2 (two) times daily with a meal.     citalopram 20 MG tablet  Commonly known as:  CELEXA  Take 20 mg by mouth daily.     dicyclomine 10 MG capsule  Commonly known as:  BENTYL  Take 1 capsule by mouth 4 (four) times daily - after meals and at bedtime.     JANUVIA 100 MG tablet  Generic drug:  sitaGLIPtin  Take 100 mg by mouth daily.     lactulose 10 GM/15ML solution  Commonly known as:  CHRONULAC  Take 30 mLs (20 g total) by mouth 2 (two) times daily as needed for mild constipation.     lisinopril 5 MG tablet  Commonly known as:  PRINIVIL,ZESTRIL  Take 1 tablet by mouth daily.     metFORMIN 1000 MG tablet  Commonly known as:  GLUCOPHAGE  Take 1 tablet by mouth 2 (two) times daily.     metoprolol succinate 25 MG 24 hr tablet  Commonly known as:  TOPROL-XL  Take 1 tablet (25 mg total) by mouth daily.     midodrine 10 MG tablet  Commonly known as:  PROAMATINE  Take 1 tablet (10 mg total) by mouth 3 (three) times daily with meals.      morphine 60 MG 12 hr tablet  Commonly known as:  MS CONTIN  Take 1 tablet (60 mg total) by mouth 2 (two) times daily.     omeprazole 20 MG capsule  Commonly known as:  PRILOSEC  Take 20 mg by mouth daily.     pantoprazole 40 MG tablet  Commonly known as:  PROTONIX  Take 1 tablet (40 mg total) by mouth 2 (two) times daily.     psyllium 58.6 % packet  Commonly known as:  METAMUCIL  Take 1 packet by mouth every other day.     tiZANidine 4 MG tablet  Commonly known as:  ZANAFLEX  Take 4 mg by mouth every 6 (six) hours.        Relevant Imaging Results:  Relevant Lab Results:  Recent Labs    Additional Information    Maurine Cane, LCSW

## 2015-07-08 NOTE — Care Management Important Message (Signed)
Important Message  Patient Details  Name: [name] MRN: [id number] Date of Birth: 08-29-1956   Medicare Important Message Given:  Yes-second notification given    Shelbie Ammons, RN 07/08/2015, 1:32 PM

## 2015-07-08 NOTE — Care Management (Signed)
Admitted to this facility with the diagnosis of intractable nausea and vomiting. Lives with husband, [name], [phone]) and step-son, [place], age 61. States she does have a son who lives in [place]. [facility] was following Ms. Szczesny when discharged from this facility 04/07/15. States that home health doesn't come anymore. No skilled facility. States she doesn't use a rolling walker or a cane. Poor appetite.  Frequent falls in the home. "Don't have a doctor to go see.". States her husband doesn't have a car, the son transports them where ever they want to go.  WBC's 21.0 yesterday, 9.3 today.  Clear liquid diet. IV Zosyn and Vancomycin continues. Shelbie Ammons RN MSN CCM Care Management [phone]

## 2015-07-08 NOTE — Progress Notes (Signed)
[facility] at [place] NAME: [name]    MR#:  [id number]  DATE OF BIRTH:  May 20, 1956  SUBJECTIVE:  CHIEF COMPLAINT:  More alert today, still somewhat disoriented and with episodes of decreased responsive ness.  REVIEW OF SYSTEMS:   Unable to obtain due to altered mental status  DRUG ALLERGIES:   Allergies  Allergen Reactions  . Erythromycin Base Other (See Comments)    Reaction:  Fever   . Rosiglitazone Maleate Swelling  . Codeine Sulfate Rash  . Tetanus-Diphtheria Toxoids Td Rash and Other (See Comments)    Reaction:  Fever     VITALS:  Blood pressure 147/78, pulse 69, temperature 99.3 F (37.4 C), temperature source Oral, resp. rate 18, height 5' 3"  (1.6 m), weight 60.827 kg (134 lb 1.6 oz), SpO2 100 %.  PHYSICAL EXAMINATION:  GENERAL:  59 y.o.-year-old patient lying in the bed with no acute distress.  EYES: Pupils equal, round, reactive to light and accommodation. No scleral icterus. Extraocular muscles intact.  HEENT: Head atraumatic, normocephalic. Oropharynx and nasopharynx clear. Make his membranes are dry NECK:  Supple, no jugular venous distention. No thyroid enlargement, no tenderness.  LUNGS: Normal breath sounds bilaterally, no wheezing, rales,rhonchi or crepitation. No use of accessory muscles of respiration.  CARDIOVASCULAR: S1, S2 normal. No murmurs, rubs, or gallops. Tachycardic, irregular ABDOMEN: Guarding, very tender throughout, nondistended, bowel sounds are decreased, no organomegaly or mass noted, bowel movement noted EXTREMITIES: No pedal edema, cyanosis, or clubbing.   NEUROLOGIC: Cranial nerves II through XII are intact. Muscle strength 4 /5 in all extremities. Very slow to answer questions and follow commands.  PSYCHIATRIC: The patient is alert but disoriented and with psychomotor retardation. More talkative this mroning.  SKIN: No obvious rash, lesion, or ulcer.  bruises are noticed on her skin  on her extremities  LABORATORY PANEL:   CBC  Recent Labs Lab 07/08/15 0647  WBC 19.5*  HGB 11.2*  HCT 34.7*  PLT 89*   ------------------------------------------------------------------------------------------------------------------  Chemistries   Recent Labs Lab 07/06/15 0414  07/08/15 0647  NA 139  < > 141  K 2.9*  < > 4.0  CL 109  < > 115*  CO2 19*  < > 21*  GLUCOSE 284*  < > 173*  BUN 21*  < > 23*  CREATININE 0.94  < > 0.76  CALCIUM 8.5*  < > 8.3*  MG 1.6*  --   --   AST  --   < > 32  ALT  --   < > 54  ALKPHOS  --   < > 76  BILITOT  --   < > 0.4  < > = values in this interval not displayed. ------------------------------------------------------------------------------------------------------------------  Cardiac Enzymes No results for input(s): TROPONINI in the last 168 hours. ------------------------------------------------------------------------------------------------------------------  RADIOLOGY:  Dg Abd 1 View  07/06/2015  CLINICAL DATA:  Abdominal pain with blood in stool EXAM: ABDOMEN - 1 VIEW COMPARISON:  CT abdomen and pelvis July 03, 2015 FINDINGS: There is moderate stool in the colon. There is no bowel dilatation or air-fluid level suggesting obstruction. No free air is seen on this supine examination. There are phleboliths in the pelvis. There is lumbar levoscoliosis. IMPRESSION: Bowel gas pattern unremarkable.  Moderate stool in colon. Electronically Signed   By: [name] M.D.   On: 07/06/2015 15:57   Dg Chest Port 1 View  07/06/2015  CLINICAL DATA:  Patient has developing altered mental status, bloody  stools and unspecified abdominal pain that began today. EXAM: PORTABLE CHEST 1 VIEW COMPARISON:  06/02/2015 FINDINGS: The heart size and mediastinal contours are within normal limits. Both lungs are clear. The visualized skeletal structures are unremarkable. IMPRESSION: No active disease. Electronically Signed   By: [name] M.D.    On: 07/06/2015 15:58    EKG:   Orders placed or performed during the hospital encounter of 06/02/15  . EKG 12-Lead  . EKG 12-Lead  . ED EKG within 10 minutes  . ED EKG within 10 minutes  . EKG    ASSESSMENT AND PLAN:   60 year old Caucasian female history of type 2 diabetes non-insulin-requiring as was history of uterine cancer presenting with abdominal pain  1. Altered mental status: Improving - Likely a metabolic encephalopathy possibly due to infection versus slow metabolism of opiates - CT of the head is normal, ABG normal, ammonia level normal, no focal neurologic defects - More alert this morning after holding all opiates and starting Flagyl  #2: Gastrointestinal hemorrhage: Patient had several episodes of bright red blood per rectum on 10/26. She was examined by GI. Likely trauma from lactulose enemas. This has stopped. Hemoglobin did drop from 14-12. No further signs of bleeding. Continue PPI. Enemas have been discontinued. Restarted elequis. Hemoglobin stable  3. Concern for gastric neoplasm: Unlikely - History of uterine cancer - CT scan showing a thickening of GE junction and cardia of the stomach, on review of older scans this is not a new finding - GI following, no EGD at this time, will need to hold elequis prior to any procedure - Oncology consultation appreciated    #4 cirrhosis - Etiology unclear, her husband is an alcoholic but the patient is not. She does have a long history of pain medication use so may have had excessive Tylenol exposure over time - Has had elevated ammonia levels during this admission. Continue lactulose - Initial transaminases elevated, now stable, hold statin  5. Paroxysmal atrial fibrillation:  - Currently in NSR. Continue with metoprolol, Amiodarone, and apixaban  6. Type 2 diabetes non-insulin-requiring:  - Hold oral agents continue insulin sliding scale, low-dose Levemir started due to elevated blood sugars  #7 Clostridium  difficile: C. difficile screen is equivocal.  Flagyl started on 10/27 for likely Clostridium difficile infection. Contact precautions. Leukocytosis is improving.   CODE STATUS: Full code. I have discussed with her husband [name] on 10/27  TOTAL TIME TAKING CARE OF THIS PATIENT: 35 minutes. Care plan discussed with the patient and with nursing. I have attempted to contact her husband but there is no answer.  POSSIBLE D/C IN 2-3 DAYS, DEPENDING ON CLINICAL CONDITION.   Myrtis Ser M.D on 07/08/2015 at 11:54 AM  Between 7am to 6pm - Pager - [phone] After 6pm go to [email or web address] - password [email or web address] [facility]  [place] [facility]  Office  [phone]  CC: Primary care physician; Sheral Flow, NP

## 2015-07-09 ENCOUNTER — Encounter: Payer: Self-pay | Admitting: Gastroenterology

## 2015-07-09 LAB — COMPREHENSIVE METABOLIC PANEL
ALT: 47 U/L (ref 14–54)
AST: 24 U/L (ref 15–41)
Albumin: 2.6 g/dL — ABNORMAL LOW (ref 3.5–5.0)
Alkaline Phosphatase: 79 U/L (ref 38–126)
Anion gap: 5 (ref 5–15)
BUN: 21 mg/dL — ABNORMAL HIGH (ref 6–20)
CHLORIDE: 115 mmol/L — AB (ref 101–111)
CO2: 22 mmol/L (ref 22–32)
CREATININE: 0.75 mg/dL (ref 0.44–1.00)
Calcium: 8.4 mg/dL — ABNORMAL LOW (ref 8.9–10.3)
Glucose, Bld: 150 mg/dL — ABNORMAL HIGH (ref 65–99)
Potassium: 4 mmol/L (ref 3.5–5.1)
SODIUM: 142 mmol/L (ref 135–145)
Total Bilirubin: 0.9 mg/dL (ref 0.3–1.2)
Total Protein: 5.1 g/dL — ABNORMAL LOW (ref 6.5–8.1)

## 2015-07-09 LAB — GLUCOSE, CAPILLARY
GLUCOSE-CAPILLARY: 134 mg/dL — AB (ref 65–99)
GLUCOSE-CAPILLARY: 318 mg/dL — AB (ref 65–99)
GLUCOSE-CAPILLARY: 315 mg/dL — AB (ref 65–99)
GLUCOSE-CAPILLARY: 228 mg/dL — AB (ref 65–99)

## 2015-07-09 LAB — CBC
HCT: 35.4 % (ref 35.0–47.0)
Hemoglobin: 11.5 g/dL — ABNORMAL LOW (ref 12.0–16.0)
MCH: 31.7 pg (ref 26.0–34.0)
MCHC: 32.4 g/dL (ref 32.0–36.0)
MCV: 97.7 fL (ref 80.0–100.0)
PLATELETS: 94 10*3/uL — AB (ref 150–440)
RBC: 3.62 MIL/uL — AB (ref 3.80–5.20)
RDW: 16.9 % — AB (ref 11.5–14.5)
WBC: 13 10*3/uL — AB (ref 3.6–11.0)

## 2015-07-09 LAB — HEPATITIS B SURFACE ANTIGEN: Hepatitis B Surface Ag: NEGATIVE

## 2015-07-09 LAB — AMMONIA: AMMONIA: 22 umol/L (ref 9–35)

## 2015-07-09 MED ORDER — RIFAXIMIN 550 MG PO TABS
550.0000 mg | ORAL_TABLET | Freq: Two times a day (BID) | ORAL | Status: DC
  Administered 2015-07-09 – 2015-07-11 (×5): 550 mg via ORAL
  Filled 2015-07-09 (×7): qty 1

## 2015-07-09 NOTE — Progress Notes (Signed)
[facility] at [place] NAME: [name]    MR#:  [id number]  DATE OF BIRTH:  [birth year]  SUBJECTIVE:  CHIEF COMPLAINT:  More alert today, comfortable. Denies significant pain, patient's diarrhea seemed to be subsiding on Flagyl. White blood cell count is also improving on Flagyl. Skilled nursing facility is being planned on Monday REVIEW OF SYSTEMS:   Unable to obtain due to altered mental status  DRUG ALLERGIES:   Allergies  Allergen Reactions  . Erythromycin Base Other (See Comments)    Reaction:  Fever   . Rosiglitazone Maleate Swelling  . Codeine Sulfate Rash  . Tetanus-Diphtheria Toxoids Td Rash and Other (See Comments)    Reaction:  Fever     VITALS:  Blood pressure 132/66, pulse 66, temperature 97.9 F (36.6 C), temperature source Oral, resp. rate 18, height 5' 3"  (1.6 m), weight 65.817 kg (145 lb 1.6 oz), SpO2 99 %.  PHYSICAL EXAMINATION:  GENERAL:  60 y.o.-year-old patient lying in the bed with no acute distress.  EYES: Pupils equal, round, reactive to light and accommodation. No scleral icterus. Extraocular muscles intact.  HEENT: Head atraumatic, normocephalic. Oropharynx and nasopharynx clear. Make his membranes are dry NECK:  Supple, no jugular venous distention. No thyroid enlargement, no tenderness.  LUNGS: Normal breath sounds bilaterally, no wheezing, rales,rhonchi or crepitation. No use of accessory muscles of respiration.  CARDIOVASCULAR: S1, S2 normal. No murmurs, rubs, or gallops. Tachycardic, irregular ABDOMEN: Guarding, very tender throughout, nondistended, bowel sounds are decreased, no organomegaly or mass noted, bowel movement noted EXTREMITIES: No pedal edema, cyanosis, or clubbing.   NEUROLOGIC: Cranial nerves II through XII are intact. Muscle strength 4 /5 in all extremities. Very slow to answer questions and follow commands.  PSYCHIATRIC: The patient is alert but disoriented and with psychomotor  retardation. More talkative this mroning.  SKIN: No obvious rash, lesion, or ulcer.  bruises are noticed on her skin on her extremities  LABORATORY PANEL:   CBC  Recent Labs Lab 07/09/15 0647  WBC 13.0*  HGB 11.5*  HCT 35.4  PLT 94*   ------------------------------------------------------------------------------------------------------------------  Chemistries   Recent Labs Lab 07/06/15 0414  07/09/15 0647  NA 139  < > 142  K 2.9*  < > 4.0  CL 109  < > 115*  CO2 19*  < > 22  GLUCOSE 284*  < > 150*  BUN 21*  < > 21*  CREATININE 0.94  < > 0.75  CALCIUM 8.5*  < > 8.4*  MG 1.6*  --   --   AST  --   < > 24  ALT  --   < > 47  ALKPHOS  --   < > 79  BILITOT  --   < > 0.9  < > = values in this interval not displayed. ------------------------------------------------------------------------------------------------------------------  Cardiac Enzymes No results for input(s): TROPONINI in the last 168 hours. ------------------------------------------------------------------------------------------------------------------  RADIOLOGY:  No results found.  EKG:   Orders placed or performed during the hospital encounter of 06/02/15  . EKG 12-Lead  . EKG 12-Lead  . ED EKG within 10 minutes  . ED EKG within 10 minutes  . EKG    ASSESSMENT AND PLAN:   59 year old Caucasian female history of type 2 diabetes non-insulin-requiring as was history of uterine cancer presenting with abdominal pain  1. Metabolic encephalopathy possibly due to infection versus slow metabolism of opiates, improving now - CT of the head is normal, ABG normal, ammonia  level normal, no focal neurologic defects - Alert this morning after holding all opiates and starting Flagyl, following tomorrow morning  #2: Gastrointestinal hemorrhage: Patient had several episodes of bright red blood per rectum on 10/26. She was examined by GI. Likely trauma from lactulose enemas. This has been stopped. Hemoglobin did  drop from 14-12. No further signs of bleeding. Continue PPI. Enemas have been discontinued. Restarted Eliquis. Hemoglobin remains stable  3. Concern for gastric neoplasm: Unlikely - History of uterine cancer - CT scan showing a thickening of GE junction and cardia of the stomach, on review of older scans this is not a new finding - GI following, no EGD at this time, will need to hold eliquis prior to any procedure - Oncology consultation appreciated    #4 cirrhosis - Etiology unclear, her husband is an alcoholic but the patient is not. She does have a long history of pain medication use so may have had excessive Tylenol exposure over time - Has had elevated ammonia levels during this admission. Continue lactulose, add Xifaxan, may need to discontinue lecture lows depending on diarrheal stool frequency - Initial transaminases elevated, now stable, hold statin  5. Paroxysmal atrial fibrillation:  - Currently in NSR. Continue with metoprolol, Amiodarone, and apixaban  6. Type 2 diabetes non-insulin-requiring:  - Hold oral agents continue insulin sliding scale, low-dose Levemir started due to elevated blood sugars  #7 Clostridium difficile: C. difficile screen is equivocal.  Flagyl started on 10/27 for likely Clostridium difficile infection. Contact precautions. Leukocytosis is improving with therapy. #8. Generalized weakness. Patient was seen by physical therapist and recommended skilled nursing facility placement, where she will be going likely next week, social worker is involved  CODE STATUS: Full code. Dr. Volanda Napoleon have discussed with her husband [name] on 10/27  TOTAL TIME TAKING CARE OF THIS PATIENT: 35 minutes today  POSSIBLE D/C IN 2-3 DAYS, DEPENDING ON CLINICAL CONDITION.   Theodoro Grist M.D on 07/09/2015 at 10:43 AM  Between 7am to 6pm - Pager - [phone] After 6pm go to [email or web address] - password [email or web address] [facility]  [place] [facility]  Office  [phone]  CC: Primary  care physician; Sheral Flow, NP

## 2015-07-09 NOTE — Plan of Care (Signed)
Problem: Discharge Progression Outcomes Goal: Other Discharge Outcomes/Goals Outcome: Progressing Pt is alert, oriented to self and location. C/o abdominal pain, tylenol and ibuprofen given with some relief. NS at 100 ml/hr running. Pt able to take all night time medications, remains incontinent.

## 2015-07-09 NOTE — Progress Notes (Signed)
Pt alert, oriented x4. No complaints of pain, taking scheduled medications. Hgb-11.5, loose stool but no signs of bleeding Ct- No abnormalities

## 2015-07-09 NOTE — Consult Note (Signed)
  GI Inpatient Follow-up Note  Patient Identification: [name] is a 59 y.o. female  Subjective: Covering for Dr. Gustavo Lah. Less abdominal pain. On liquid diet. No vomiting. Mentation seems back to baseline for pt.  Scheduled Inpatient Medications:  . amiodarone  200 mg Oral Daily  . apixaban  5 mg Oral BID  . ciprofloxacin  500 mg Oral BID  . citalopram  20 mg Oral Daily  . dicyclomine  10 mg Oral TID PC & HS  . feeding supplement  1 Container Oral TID WC  . insulin aspart  0-15 Units Subcutaneous TID WC  . insulin aspart  0-5 Units Subcutaneous QHS  . insulin detemir  12 Units Subcutaneous Daily  . lactulose  20 g Oral Daily  . lisinopril  5 mg Oral Daily  . metoprolol succinate  25 mg Oral Daily  . metroNIDAZOLE  500 mg Oral 3 times per day  . midodrine  10 mg Oral TID WC  . pantoprazole  40 mg Oral BID  . potassium chloride  40 mEq Oral BID  . rifaximin  550 mg Oral BID    Continuous Inpatient Infusions:   . sodium chloride 100 mL/hr at 07/09/15 1118    PRN Inpatient Medications:  acetaminophen **OR** acetaminophen, hydrALAZINE, ibuprofen, ondansetron **OR** ondansetron (ZOFRAN) IV  Review of Systems: Constitutional: Weight is stable.  Eyes: No changes in vision. ENT: No oral lesions, sore throat.  GI: see HPI.  Heme/Lymph: No easy bruising.  CV: No chest pain.  GU: No hematuria.  Integumentary: No rashes.  Neuro: No headaches.  Psych: No depression/anxiety.  Endocrine: No heat/cold intolerance.  Allergic/Immunologic: No urticaria.  Resp: No cough, SOB.  Musculoskeletal: No joint swelling.    Physical Examination: BP 180/72 mmHg  Pulse 79  Temp(Src) 97.9 F (36.6 C) (Oral)  Resp 18  Ht 5' 3"  (1.6 m)  Wt 65.817 kg (145 lb 1.6 oz)  BMI 25.71 kg/m2  SpO2 100% Gen: NAD, alert and oriented x 4 HEENT: PEERLA, EOMI, Neck: supple, no JVD or thyromegaly Chest: CTA bilaterally, no wheezes, crackles, or other adventitious sounds CV: RRR, no  m/g/c/r Abd: soft, minimal tenderness, ND, +BS in all four quadrants; no HSM, guarding, ridigity, or rebound tenderness Ext: no edema, well perfused with 2+ pulses, Skin: no rash or lesions noted Lymph: no LAD  Data: Lab Results  Component Value Date   WBC 13.0* 07/09/2015   HGB 11.5* 07/09/2015   HCT 35.4 07/09/2015   MCV 97.7 07/09/2015   PLT 94* 07/09/2015    Recent Labs Lab 07/07/15 0434 07/08/15 0647 07/09/15 0647  HGB 12.7 11.2* 11.5*   Lab Results  Component Value Date   NA 142 07/09/2015   K 4.0 07/09/2015   CL 115* 07/09/2015   CO2 22 07/09/2015   BUN 21* 07/09/2015   CREATININE 0.75 07/09/2015   Lab Results  Component Value Date   ALT 47 07/09/2015   AST 24 07/09/2015   ALKPHOS 79 07/09/2015   BILITOT 0.9 07/09/2015   No results for input(s): APTT, INR, PTT in the last 168 hours. Assessment/Plan: [name] is a 59 y.o. female with probable diverticulitis. Doubt abnormal CT finding of esophagus is concerning. Nissen fundoplication can explain this CT finding.  Recommendations: Advance diet slowly as tolerated. Continue current Abx. Please call with questions or concerns.  Galia Rahm, Lupita Dawn, MD

## 2015-07-10 DIAGNOSIS — A0472 Enterocolitis due to Clostridium difficile, not specified as recurrent: Secondary | ICD-10-CM

## 2015-07-10 DIAGNOSIS — R531 Weakness: Secondary | ICD-10-CM

## 2015-07-10 DIAGNOSIS — G9341 Metabolic encephalopathy: Secondary | ICD-10-CM

## 2015-07-10 DIAGNOSIS — I4891 Unspecified atrial fibrillation: Secondary | ICD-10-CM

## 2015-07-10 DIAGNOSIS — K7469 Other cirrhosis of liver: Secondary | ICD-10-CM

## 2015-07-10 DIAGNOSIS — I480 Paroxysmal atrial fibrillation: Secondary | ICD-10-CM

## 2015-07-10 HISTORY — DX: Enterocolitis due to Clostridium difficile, not specified as recurrent: A04.72

## 2015-07-10 LAB — HCV COMMENT:

## 2015-07-10 LAB — GLUCOSE, CAPILLARY
GLUCOSE-CAPILLARY: 243 mg/dL — AB (ref 65–99)
GLUCOSE-CAPILLARY: 162 mg/dL — AB (ref 65–99)
Glucose-Capillary: 228 mg/dL — ABNORMAL HIGH (ref 65–99)
Glucose-Capillary: 231 mg/dL — ABNORMAL HIGH (ref 65–99)
Glucose-Capillary: 228 mg/dL — ABNORMAL HIGH (ref 65–99)

## 2015-07-10 LAB — CBC
HEMATOCRIT: 35.8 % (ref 35.0–47.0)
Hemoglobin: 12.1 g/dL (ref 12.0–16.0)
MCH: 32.6 pg (ref 26.0–34.0)
MCHC: 33.8 g/dL (ref 32.0–36.0)
MCV: 96.5 fL (ref 80.0–100.0)
PLATELETS: 125 10*3/uL — AB (ref 150–440)
RBC: 3.71 MIL/uL — ABNORMAL LOW (ref 3.80–5.20)
RDW: 16.5 % — AB (ref 11.5–14.5)
WBC: 14.5 10*3/uL — AB (ref 3.6–11.0)

## 2015-07-10 LAB — HEPATITIS C ANTIBODY (REFLEX)

## 2015-07-10 LAB — ANTI-SMOOTH MUSCLE ANTIBODY, IGG: F-Actin IgG: 12 Units (ref 0–19)

## 2015-07-10 LAB — MITOCHONDRIAL ANTIBODIES: Mitochondrial M2 Ab, IgG: 4.8 Units (ref 0.0–20.0)

## 2015-07-10 MED ORDER — LIDOCAINE 5 % EX PTCH
1.0000 | MEDICATED_PATCH | CUTANEOUS | Status: DC
  Administered 2015-07-10 – 2015-07-11 (×2): 1 via TRANSDERMAL
  Filled 2015-07-10 (×3): qty 1

## 2015-07-10 MED ORDER — INSULIN DETEMIR 100 UNIT/ML ~~LOC~~ SOLN
17.0000 [IU] | Freq: Every day | SUBCUTANEOUS | Status: DC
  Administered 2015-07-11: 17 [IU] via SUBCUTANEOUS
  Filled 2015-07-10: qty 0.17

## 2015-07-10 MED ORDER — MORPHINE SULFATE (PF) 2 MG/ML IV SOLN
1.0000 mg | INTRAVENOUS | Status: DC | PRN
  Administered 2015-07-10 – 2015-07-11 (×6): 1 mg via INTRAVENOUS
  Filled 2015-07-10 (×6): qty 1

## 2015-07-10 NOTE — Progress Notes (Signed)
[facility] at [place] NAME: Crista Nuon    MR#:  [id number]  DATE OF BIRTH:  10/04/1955  SUBJECTIVE:  CHIEF COMPLAINT:  Alert , comfortable, although would like to have some morphine or some other medication for lower back pain. Denies pain in abdomen, patient's diarrhea subsided some on Flagyl, although patient was on lactulose. White blood cell count is also improving on Flagyl. Skilled nursing facility is being planned on Monday REVIEW OF SYSTEMS:   Unable to obtain due to altered mental status  DRUG ALLERGIES:   Allergies  Allergen Reactions  . Erythromycin Base Other (See Comments)    Reaction:  Fever   . Rosiglitazone Maleate Swelling  . Codeine Sulfate Rash  . Tetanus-Diphtheria Toxoids Td Rash and Other (See Comments)    Reaction:  Fever     VITALS:  Blood pressure 159/79, pulse 96, temperature 98.5 F (36.9 C), temperature source Oral, resp. rate 18, height 5' 3"  (1.6 m), weight 65.772 kg (145 lb), SpO2 98 %.  PHYSICAL EXAMINATION:  GENERAL:  59 y.o.-year-old patient lying in the bed with no acute distress. Comfortable and interactive EYES: Pupils equal, round, reactive to light and accommodation. No scleral icterus. Extraocular muscles intact.  HEENT: Head atraumatic, normocephalic. Oropharynx and nasopharynx clear. Make his membranes are dry NECK:  Supple, no jugular venous distention. No thyroid enlargement, no tenderness.  LUNGS: Normal breath sounds bilaterally, no wheezing, rales,rhonchi or crepitation. No use of accessory muscles of respiration.  CARDIOVASCULAR: S1, S2 normal. No murmurs, rubs, or gallops. Tachycardic, irregular ABDOMEN: Guarding, very tender throughout, nondistended, bowel sounds are decreased, no organomegaly or mass noted, bowel movement noted EXTREMITIES: No pedal edema, cyanosis, or clubbing.   NEUROLOGIC: Cranial nerves II through XII are intact. Muscle strength 4 /5 in all extremities.  PSYCHIATRIC: The patient is alert but disoriented and with psychomotor retardation. More talkative this mroning.  SKIN: No obvious rash, lesion, or ulcer.  bruises are noticed on her skin on her extremities  LABORATORY PANEL:   CBC  Recent Labs Lab 07/10/15 0522  WBC 14.5*  HGB 12.1  HCT 35.8  PLT 125*   ------------------------------------------------------------------------------------------------------------------  Chemistries   Recent Labs Lab 07/06/15 0414  07/09/15 0647  NA 139  < > 142  K 2.9*  < > 4.0  CL 109  < > 115*  CO2 19*  < > 22  GLUCOSE 284*  < > 150*  BUN 21*  < > 21*  CREATININE 0.94  < > 0.75  CALCIUM 8.5*  < > 8.4*  MG 1.6*  --   --   AST  --   < > 24  ALT  --   < > 47  ALKPHOS  --   < > 79  BILITOT  --   < > 0.9  < > = values in this interval not displayed. ------------------------------------------------------------------------------------------------------------------  Cardiac Enzymes No results for input(s): TROPONINI in the last 168 hours. ------------------------------------------------------------------------------------------------------------------  RADIOLOGY:  No results found.  EKG:   Orders placed or performed during the hospital encounter of 06/02/15  . EKG 12-Lead  . EKG 12-Lead  . ED EKG within 10 minutes  . ED EKG within 10 minutes  . EKG    ASSESSMENT AND PLAN:   59 year old Caucasian female history of type 2 diabetes non-insulin-requiring as was history of uterine cancer presenting with abdominal pain  1. Metabolic encephalopathy possibly due to infection versus slow metabolism of opiates, improved now -  CT of the head is normal, ABG normal, ammonia level normal, no focal neurologic defects - Holding opiates and continue Flagyl, following tomorrow morning  #2: Gastrointestinal hemorrhage: Patient had several episodes of bright red blood per rectum on 10/26. She was examined by GI. Likely trauma from lactulose  enemas. This has been stopped. Hemoglobin did drop from 14-12. No further signs of bleeding. Continue PPI.  Restarted Eliquis. Hemoglobin remains stable  3. Abnormal CT scan of abdomen revealing masslike thickening of proximal stomach,  Concern for gastric neoplasm: Unlikely per gastroenterology - History of uterine cancer - CT scan showing a thickening of GE junction and cardia of the stomach, on review of older scans this is not a new finding - GI following, no EGD at this time, will need to hold eliquis prior to any procedure - Oncology consultation appreciated    #4 cirrhosis - Etiology unclear, her husband is an alcoholic but the patient is not. She does have a long history of pain medication use so may have had excessive Tylenol exposure over time - Has had elevated ammonia levels during this admission. Continue Xifaxan, discontinue lactulose , follow diarrheal stool frequency - Initial transaminases elevated, now stable, holding statin  5. Paroxysmal atrial fibrillation:  - Currently in NSR. Continue with metoprolol, Amiodarone, and apixaban  6. Type 2 diabetes non-insulin-requiring:  - Hold oral agents continue insulin sliding scale, advance Levemir due to elevated blood sugars above 200  #7 Clostridium difficile: C. difficile screen is equivocal.  Flagyl started on 10/27 for likely Clostridium difficile infection. Contact precautions. Leukocytosis is improving with therapy.  #8. Generalized weakness. Patient was seen by physical therapist and recommended skilled nursing facility placement, where she will be going likely next week, social worker is involved  CODE STATUS: Full code. Dr. Volanda Napoleon have discussed with her husband [name] on 10/27  TOTAL TIME TAKING CARE OF THIS PATIENT: 35 minutes today  POSSIBLE D/C IN 1 DAY, DEPENDING ON CLINICAL CONDITION.   Theodoro Grist M.D on 07/10/2015 at 12:30 PM  Between 7am to 6pm - Pager - [phone] After 6pm go to [email or web address] -  password [email or web address] [facility]  [place] [facility]  Office  [phone]  CC: Primary care physician; Sheral Flow, NP

## 2015-07-10 NOTE — Consult Note (Signed)
  GI Inpatient Follow-up Note  Patient Identification: [name] is a 59 y.o. female with abdominal pain and nausea.  Subjective: Pt alert. No more abdominal pain or nausea. Does c/o back pain. Requests morphine prn. Scheduled Inpatient Medications:  . amiodarone  200 mg Oral Daily  . apixaban  5 mg Oral BID  . ciprofloxacin  500 mg Oral BID  . citalopram  20 mg Oral Daily  . dicyclomine  10 mg Oral TID PC & HS  . feeding supplement  1 Container Oral TID WC  . insulin aspart  0-15 Units Subcutaneous TID WC  . insulin aspart  0-5 Units Subcutaneous QHS  . insulin detemir  12 Units Subcutaneous Daily  . lactulose  20 g Oral Daily  . lidocaine  1 patch Transdermal Q24H  . lisinopril  5 mg Oral Daily  . metoprolol succinate  25 mg Oral Daily  . metroNIDAZOLE  500 mg Oral 3 times per day  . midodrine  10 mg Oral TID WC  . pantoprazole  40 mg Oral BID  . potassium chloride  40 mEq Oral BID  . rifaximin  550 mg Oral BID    Continuous Inpatient Infusions:   . sodium chloride 100 mL/hr at 07/10/15 0918    PRN Inpatient Medications:  acetaminophen **OR** acetaminophen, hydrALAZINE, ibuprofen, morphine injection, ondansetron **OR** ondansetron (ZOFRAN) IV  Review of Systems: Constitutional: Weight is stable.  Eyes: No changes in vision. ENT: No oral lesions, sore throat.  GI: see HPI.  Heme/Lymph: No easy bruising.  CV: No chest pain.  GU: No hematuria.  Integumentary: No rashes.  Neuro: No headaches.  Psych: No depression/anxiety.  Endocrine: No heat/cold intolerance.  Allergic/Immunologic: No urticaria.  Resp: No cough, SOB.  Musculoskeletal: No joint swelling.    Physical Examination: BP 159/79 mmHg  Pulse 96  Temp(Src) 98.5 F (36.9 C) (Oral)  Resp 18  Ht 5' 3"  (1.6 m)  Wt 65.772 kg (145 lb)  BMI 25.69 kg/m2  SpO2 98% Gen: NAD, alert and oriented x 4 HEENT: PEERLA, EOMI, Neck: supple, no JVD or thyromegaly Chest: CTA bilaterally, no wheezes, crackles,  or other adventitious sounds CV: RRR, no m/g/c/r Abd: soft, NT, ND, +BS in all four quadrants; no HSM, guarding, ridigity, or rebound tenderness Ext: no edema, well perfused with 2+ pulses, Skin: no rash or lesions noted Lymph: no LAD  Data: Lab Results  Component Value Date   WBC 14.5* 07/10/2015   HGB 12.1 07/10/2015   HCT 35.8 07/10/2015   MCV 96.5 07/10/2015   PLT 125* 07/10/2015    Recent Labs Lab 07/08/15 0647 07/09/15 0647 07/10/15 0522  HGB 11.2* 11.5* 12.1   Lab Results  Component Value Date   NA 142 07/09/2015   K 4.0 07/09/2015   CL 115* 07/09/2015   CO2 22 07/09/2015   BUN 21* 07/09/2015   CREATININE 0.75 07/09/2015   Lab Results  Component Value Date   ALT 47 07/09/2015   AST 24 07/09/2015   ALKPHOS 79 07/09/2015   BILITOT 0.9 07/09/2015   No results for input(s): APTT, INR, PTT in the last 168 hours. Assessment/Plan: Ms. Kinder is a 59 y.o. female with abdominal pain. Diverticulitis? Stable.  Recommendations: Finish course of Abx. Advance diet to low residue diet. Back pain control. Dr. Gustavo Lah will check back tomorrow. thanks Please call with questions or concerns.  Oseias Horsey, Lupita Dawn, MD

## 2015-07-11 LAB — CBC
HEMATOCRIT: 36.3 % (ref 35.0–47.0)
HEMOGLOBIN: 12.3 g/dL (ref 12.0–16.0)
MCH: 33.1 pg (ref 26.0–34.0)
MCHC: 34 g/dL (ref 32.0–36.0)
MCV: 97.5 fL (ref 80.0–100.0)
Platelets: 133 10*3/uL — ABNORMAL LOW (ref 150–440)
RBC: 3.72 MIL/uL — ABNORMAL LOW (ref 3.80–5.20)
RDW: 16.9 % — ABNORMAL HIGH (ref 11.5–14.5)
WBC: 13.4 10*3/uL — AB (ref 3.6–11.0)

## 2015-07-11 LAB — GLUCOSE, CAPILLARY
GLUCOSE-CAPILLARY: 236 mg/dL — AB (ref 65–99)
Glucose-Capillary: 132 mg/dL — ABNORMAL HIGH (ref 65–99)

## 2015-07-11 LAB — HEPATITIS B SURFACE ANTIBODY, QUANTITATIVE

## 2015-07-11 LAB — ANTINUCLEAR ANTIBODIES, IFA: ANA Ab, IFA: NEGATIVE

## 2015-07-11 MED ORDER — INSULIN DETEMIR 100 UNIT/ML ~~LOC~~ SOLN: 17.0000 [IU] | Freq: Every day | SUBCUTANEOUS | Status: DC

## 2015-07-11 MED ORDER — MORPHINE SULFATE ER 60 MG PO TBCR: 60.0000 mg | EXTENDED_RELEASE_TABLET | Freq: Two times a day (BID) | ORAL | Status: DC

## 2015-07-11 MED ORDER — CIPROFLOXACIN HCL 500 MG PO TABS: 500.0000 mg | ORAL_TABLET | Freq: Two times a day (BID) | ORAL | Status: DC

## 2015-07-11 MED ORDER — LISINOPRIL 10 MG PO TABS: 5.0000 mg | ORAL_TABLET | Freq: Two times a day (BID) | ORAL | Status: DC

## 2015-07-11 MED ORDER — RIFAXIMIN 550 MG PO TABS: 550.0000 mg | ORAL_TABLET | Freq: Two times a day (BID) | ORAL | Status: DC

## 2015-07-11 MED ORDER — ENSURE ENLIVE PO LIQD
237.0000 mL | Freq: Two times a day (BID) | ORAL | Status: DC
  Administered 2015-07-11: 10:00:00 237 mL via ORAL

## 2015-07-11 MED ORDER — INSULIN STARTER KIT- SYRINGES (ENGLISH)
1.0000 | Freq: Once | Status: AC
  Administered 2015-07-11: 15:00:00 1
  Filled 2015-07-11: qty 1

## 2015-07-11 MED ORDER — METRONIDAZOLE 500 MG PO TABS: 500.0000 mg | ORAL_TABLET | Freq: Three times a day (TID) | ORAL | Status: DC

## 2015-07-11 MED ORDER — BOOST / RESOURCE BREEZE PO LIQD: 1.0000 | Freq: Three times a day (TID) | ORAL | Status: DC

## 2015-07-11 MED ORDER — INSULIN ASPART 100 UNIT/ML ~~LOC~~ SOLN: 0.0000 [IU] | Freq: Three times a day (TID) | SUBCUTANEOUS | Status: DC

## 2015-07-11 NOTE — Progress Notes (Signed)
Patient ambulated to chair from bed with standby assistance. Madlyn Frankel, RN

## 2015-07-11 NOTE — Care Management Important Message (Signed)
Important Message  Patient Details  Name: [name] MRN: [id number] Date of Birth: 10-14-1955   Medicare Important Message Given:  Yes-third notification given    Shelbie Ammons, RN 07/11/2015, 1:15 PM

## 2015-07-11 NOTE — Progress Notes (Signed)
Discharge instructions given and went over with patient and family at bedside. All questions answered. Prescription given. Patient discharged home with home health services and family via wheelchair by volunteer services. Madlyn Frankel, RN

## 2015-07-11 NOTE — Care Management (Signed)
Declining skilled nursing facility. States she would like [facility] for home health agency. Dr. Ether Griffins updated. Will fax information to [facility]. States her step-son will transport. Shelbie Ammons RN MSN CCM Care Management [phone]

## 2015-07-11 NOTE — Progress Notes (Signed)
Nutrition Follow-up  DOCUMENTATION CODES:   Non-severe (moderate) malnutrition in context of chronic illness  INTERVENTION:   Meals and Snacks: Cater to patient preferences as pt diet order advanced Medical Food Supplement Therapy: recommend Ensure Enlive po BID, each supplement provides 350 kcal and 20 grams of protein. Boost Breeze previously ordered as pt on CL diet order.   NUTRITION DIAGNOSIS:   Inadequate oral intake related to nausea, vomiting as evidenced by per patient/family report  GOAL:   Patient will meet greater than or equal to 90% of their needs; ongoing  MONITOR:    (Energy Intake, Glucose Profile, Anthropometrics, Digestive system)  REASON FOR ASSESSMENT:   Malnutrition Screening Tool    ASSESSMENT:   Pt with no n/v, tolerating Soft diet order this am. Pt likely to discharge today.   Diet Order:  Diet - low sodium heart healthy DIET SOFT Room service appropriate?: Yes; Fluid consistency:: Thin    Current Nutrition: Per RN Ok Edwards pt tolerated breakfast well this am. RD notes diet order advanced last night at dinner, limited documentation of intake.   Scheduled Medications:  . amiodarone  200 mg Oral Daily  . apixaban  5 mg Oral BID  . ciprofloxacin  500 mg Oral BID  . citalopram  20 mg Oral Daily  . dicyclomine  10 mg Oral TID PC & HS  . feeding supplement  1 Container Oral TID WC  . feeding supplement (ENSURE ENLIVE)  237 mL Oral BID BM  . insulin aspart  0-15 Units Subcutaneous TID WC  . insulin aspart  0-5 Units Subcutaneous QHS  . insulin detemir  17 Units Subcutaneous Daily  . insulin starter kit- syringes  1 kit Other Once  . lidocaine  1 patch Transdermal Q24H  . lisinopril  5 mg Oral Daily  . metoprolol succinate  25 mg Oral Daily  . metroNIDAZOLE  500 mg Oral 3 times per day  . midodrine  10 mg Oral TID WC  . pantoprazole  40 mg Oral BID  . potassium chloride  40 mEq Oral BID  . rifaximin  550 mg Oral BID    Electrolyte/Renal  Profile and Glucose Profile:   Recent Labs Lab 07/06/15 0414  07/07/15 0434 07/08/15 0647 07/09/15 0647  NA 139  < > 139 141 142  K 2.9*  < > 3.9 4.0 4.0  CL 109  < > 113* 115* 115*  CO2 19*  < > 21* 21* 22  BUN 21*  < > 25* 23* 21*  CREATININE 0.94  < > 0.93 0.76 0.75  CALCIUM 8.5*  < > 8.3* 8.3* 8.4*  MG 1.6*  --   --   --   --   GLUCOSE 284*  < > 313* 173* 150*  < > = values in this interval not displayed. Protein Profile:  Recent Labs Lab 07/07/15 0434 07/08/15 0647 07/09/15 0647  ALBUMIN 2.6* 2.4* 2.6*    Gastrointestinal Profile: Last BM: 07/11/2015  Filed Weights   07/09/15 0500 07/10/15 0643 07/11/15 0432  Weight: 145 lb 1.6 oz (65.817 kg) 145 lb (65.772 kg) 145 lb 4.8 oz (65.908 kg)   BMI:  Body mass index is 25.75 kg/(m^2).  Estimated Nutritional Needs:   Kcal:  BEE: 1134kcals, TEE: (IF 1.1-1.3)(AF 1.2)  1500-1769kcals  Protein:  60-72g protein (1.0-1.2g/kg)  Fluid:  1500-1849m of fluid (25-353mkg)  EDUCATION NEEDS:   Education needs no appropriate at this time   LOWooldridgeRD, LDN Pager [phone]  [phone]  

## 2015-07-11 NOTE — Progress Notes (Signed)
Note that patient will not be going to SNF.  Called and discussed with RN.  It appears that patient is new to insulin.  Ordered insulin starter kit for patient and asked RN to do bedside teaching for insulin administration.  Patient will have home health who can also assist with further teaching.   Thanks, Jenny , RN, BC-ADM Inpatient Diabetes Coordinator Pager [phone] (8a-5p)  

## 2015-07-11 NOTE — Discharge Summary (Signed)
[place] [facility] [place] NAME: [name]    MR#:  [id number]  DATE OF BIRTH:  [birth year]  DATE OF ADMISSION:  07/03/2015 ADMITTING PHYSICIAN: Lytle Butte, MD  DATE OF DISCHARGE: No discharge date for patient encounter.  PRIMARY CARE PHYSICIAN: Sheral Flow, NP     ADMISSION DIAGNOSIS:  Viral gastroenteritis [A08.4] Upper abdominal pain [R10.10] Intractable vomiting with nausea, vomiting of unspecified type [R11.10]  DISCHARGE DIAGNOSIS:  Principal Problem:   Intractable nausea and vomiting Active Problems:   Malnutrition of moderate degree   Encephalopathy, metabolic   Liver cirrhosis (HCC)   C. difficile colitis   GI (gastrointestinal bleed)   Paroxysmal atrial fibrillation (HCC)   Diabetes mellitus (HCC)   Generalized weakness   SECONDARY DIAGNOSIS:   Past Medical History  Diagnosis Date  . Type 1 diabetes ([place])   . Hypothyroid   . Degenerative disk disease   . Stomach ulcer   . Diverticulitis   . Syncope 01/2015  . Anxiety   . GERD (gastroesophageal reflux disease)   . History of hiatal hernia   . Cancer (HCC)     HX OF CANCER OF UTERUS   . TIA (transient ischemic attack) 02/2015  . Hypertension   . Pancreatitis   . PAF (paroxysmal atrial fibrillation) ([place]) 03/2015    a. new onset 03/2015 in setting of intractable N/V; b. on Eliquis 5 mg bid; c. CHADSVASc 4 (DM, TIA x 2, female)  . Intussusception intestine ([place]) 05/2015    .pro HOSPITAL COURSE:   Patient is a 59 year old Caucasian female with history of diabetes, low back pain, degenerative disc disease, stomach ulcers, atrial fibrillation for which she is on Eliquis who presents to the hospital with complaints of abdominal pain in epigastric area, also nausea and vomiting and loose stools. Her labs revealed hypokalemia to 2.9 ,  mild elevation of liver enzymes, 200 and white blood cell count elevation. Ammonia level was also up to 59 . Her  hemoglobin A1c was found to be 9.2 . Urine analysis revealed glucosuria and pyuria.  She was noted to be obtunded . CT scan of abdomen and pelvis revealed a masslike thickening of proximal stomach concerning for neoplasm, liver changes were also noted, most compatible with cirrhosis, mild right hydroureteronephrosis also noted without frank hydronephrosis of unclear etiology. Patient was seen by gastroenterologist, Dr. Gustavo Lah  while in the hospital who felt that patient's altered mental status likely due to sepsis and hyperammonemia. In regards to abdominal pain,  Dr. Gustavo Lah  felt that patient's abdominal pain likely related to diverticulitis. Patient was initiated on ciprofloxacin and Flagyl, also lactulose and Xifaxan with improvement of her overall condition. Patient's stool culture for C. difficile was checked and it was positive for C. difficile antigen but negative for C. difficile toxin, no, and enteric pathogens were isolated fever. Blood cultures were negative. Urine culture was not taken. In regards to abnormal CT finding of esophagitis gastroenterologist felt that Nissen fundoplication can explain the CT finding, so outpatient evaluation was recommended. Patient clinically improved. She was seen by physical therapist recommended skilled nursing facility placement for rehabilitation. She was felt to be stable to be discharged to rehabilitation 31st of October 2016  Discussion by problem  1. Metabolic encephalopathy possibly due to infection versus slow metabolism of opiates, improved now - CT of the head is normal, ABG normal, ammonia level improved and, patient has no focal neurologic defects - Now she has her opiates  resumed in controlled way . Diverticulitis has been treated and infection is resolving.   #2: Gastrointestinal hemorrhage: Patient had several episodes of bright red blood per rectum on 10/26. She was examined by GI, felt to be Likely trauma from lactulose enemas. This has been  stopped. Hemoglobin did drop from 14-12. No further signs of bleeding. Continue PPI. Restarted Eliquis. Hemoglobin remains stable, no further bleeding. Patient is to follow-up with Dr. Gustavo Lah  as outpatient for further recommendations .   3. Abnormal CT scan of abdomen revealing masslike thickening of proximal stomach, Concern for gastric neoplasm: Unlikely per gastroenterology - CT scan showing a thickening of GE junction and cardia of the stomach, on review of older scans this is not a new finding - GI following, no EGD at this time, will need to hold eliquis prior to any procedure - Oncology consultation appreciated, felt to be not new finding, but EGD was recommended as outpatient.    #4 cirrhosis - Etiology unclear, her husband is an alcoholic but the patient is not. She does have a long history of pain medication use so may have had excessive Tylenol exposure over time - Has had elevated ammonia levels during this admission. Continue Xifaxan, off lactulose , follow diarrheal stool frequency,  Initial transaminases were  elevated, now improved, resume statin  5. Paroxysmal atrial fibrillation:  - Currently in NSR. Continue with metoprolol, Amiodarone, and apixaban  6. Type 2 diabetes non-insulin-requiring:  - Hold oral agents,  continue insulin sliding scale, now on Levemir, fasting blood glucose level was 132 today  #7 Clostridium difficile: C. difficile screen is equivocal. Flagyl started on 10/27 for likely Clostridium difficile infection. Contact precautions. Leukocytosis is improving with therapy, diarrhea is improving. Continue Flagyl for 14 days orally, following clinically.   #8. Generalized weakness. Patient was seen by physical therapist and recommended skilled nursing facility placement, where she will be going likely next week, social worker is involved   #9 abdominal pain, felt to be  Diverticulitis by gastroenterologist. Patient received Cipro and Flagyl while in the  hospital and completed course. Patient is to continue Flagyl now for 14 more days for suspected C. difficile enterocolitis.   #10 lower back pain, patient is to continue MS Contin in controlled fashion, add oxycodone as needed following mental status very closely DISCHARGE CONDITIONS:   Stable  CONSULTS OBTAINED:  Treatment Team:  Lytle Butte, MD Lollie Sails, MD Lloyd Huger, MD  DRUG ALLERGIES:   Allergies  Allergen Reactions  . Erythromycin Base Other (See Comments)    Reaction:  Fever   . Rosiglitazone Maleate Swelling  . Codeine Sulfate Rash  . Tetanus-Diphtheria Toxoids Td Rash and Other (See Comments)    Reaction:  Fever     DISCHARGE MEDICATIONS:   Current Discharge Medication List    START taking these medications   Details  ciprofloxacin (CIPRO) 500 MG tablet Take 1 tablet (500 mg total) by mouth 2 (two) times daily. Qty: 20 tablet, Refills: 0    feeding supplement (BOOST / RESOURCE BREEZE) LIQD Take 1 Container by mouth 3 (three) times daily with meals. Qty: 90 Container, Refills: 6    insulin aspart (NOVOLOG) 100 UNIT/ML injection Inject 0-15 Units into the skin 3 (three) times daily with meals. Qty: 10 mL, Refills: 11    insulin detemir (LEVEMIR) 100 UNIT/ML injection Inject 0.17 mLs (17 Units total) into the skin daily. Qty: 10 mL, Refills: 11    metroNIDAZOLE (FLAGYL) 500 MG tablet Take  1 tablet (500 mg total) by mouth every 8 (eight) hours. Qty: 30 tablet, Refills: 0    !! promethazine (PHENERGAN) 25 MG tablet Take 1 tablet (25 mg total) by mouth every 6 (six) hours as needed for nausea or vomiting. Qty: 15 tablet, Refills: 0    ranitidine (ZANTAC) 150 MG capsule Take 1 capsule (150 mg total) by mouth 2 (two) times daily. Qty: 28 capsule, Refills: 0    rifaximin (XIFAXAN) 550 MG TABS tablet Take 1 tablet (550 mg total) by mouth 2 (two) times daily. Qty: 60 tablet, Refills: 6     !! - Potential duplicate medications found. Please  discuss with provider.    CONTINUE these medications which have CHANGED   Details  lisinopril (PRINIVIL,ZESTRIL) 10 MG tablet Take 0.5 tablets (5 mg total) by mouth 2 (two) times daily. Qty: 60 tablet, Refills: 6    morphine (MS CONTIN) 60 MG 12 hr tablet Take 1 tablet (60 mg total) by mouth 2 (two) times daily. Qty: 60 tablet, Refills: 0    sucralfate (CARAFATE) 1 G tablet Take 1 tablet (1 g total) by mouth 4 (four) times daily. Qty: 120 tablet, Refills: 1      CONTINUE these medications which have NOT CHANGED   Details  amiodarone (PACERONE) 200 MG tablet Take 1 tablet (200 mg total) by mouth daily. Qty: 30 tablet, Refills: 0    apixaban (ELIQUIS) 5 MG TABS tablet Take 1 tablet (5 mg total) by mouth 2 (two) times daily. Qty: 60 tablet, Refills: 0    atorvastatin (LIPITOR) 40 MG tablet Take 1 tablet (40 mg total) by mouth daily at 6 PM. Qty: 30 tablet, Refills: 0    citalopram (CELEXA) 20 MG tablet Take 20 mg by mouth daily.    dicyclomine (BENTYL) 10 MG capsule Take 1 capsule by mouth 4 (four) times daily - after meals and at bedtime.     metoprolol succinate (TOPROL-XL) 25 MG 24 hr tablet Take 1 tablet (25 mg total) by mouth daily. Qty: 30 tablet, Refills: 0   Associated Diagnoses: Essential hypertension    midodrine (PROAMATINE) 10 MG tablet Take 1 tablet (10 mg total) by mouth 3 (three) times daily with meals. Qty: 90 tablet, Refills: 0    !! promethazine (PHENERGAN) 25 MG tablet Take 25 mg by mouth every 6 (six) hours as needed for nausea or vomiting.    psyllium (METAMUCIL) 58.6 % packet Take 1 packet by mouth every other day.    tiZANidine (ZANAFLEX) 4 MG tablet Take 4 mg by mouth every 6 (six) hours.    pantoprazole (PROTONIX) 40 MG tablet Take 1 tablet (40 mg total) by mouth 2 (two) times daily. Qty: 60 tablet, Refills: 0     !! - Potential duplicate medications found. Please discuss with provider.    STOP taking these medications     aspirin EC 325 MG  tablet      JANUVIA 100 MG tablet      lactulose (CHRONULAC) 10 GM/15ML solution      metFORMIN (GLUCOPHAGE) 1000 MG tablet      omeprazole (PRILOSEC) 20 MG capsule      cefUROXime (CEFTIN) 500 MG tablet          DISCHARGE INSTRUCTIONS:    Patient is to follow-up with primary care physician and gastroenterologist  If you experience worsening of your admission symptoms, develop shortness of breath, life threatening emergency, suicidal or homicidal thoughts you must seek medical attention immediately by calling 911 or  calling your MD immediately  if symptoms less severe.  You Must read complete instructions/literature along with all the possible adverse reactions/side effects for all the Medicines you take and that have been prescribed to you. Take any new Medicines after you have completely understood and accept all the possible adverse reactions/side effects.   Please note  You were cared for by a hospitalist during your hospital stay. If you have any questions about your discharge medications or the care you received while you were in the hospital after you are discharged, you can call the unit and asked to speak with the hospitalist on call if the hospitalist that took care of you is not available. Once you are discharged, your primary care physician will handle any further medical issues. Please note that NO REFILLS for any discharge medications will be authorized once you are discharged, as it is imperative that you return to your primary care physician (or establish a relationship with a primary care physician if you do not have one) for your aftercare needs so that they can reassess your need for medications and monitor your lab values.    Today   CHIEF COMPLAINT:   Chief Complaint  Patient presents with  . Nausea  . Emesis  . Diarrhea    HISTORY OF PRESENT ILLNESS:  [name]  is a 59 y.o. female with a known history of diabetes, low back pain, degenerative disc  disease, stomach ulcers, atrial fibrillation for which she is on Eliquis who presents to the hospital with complaints of abdominal pain in epigastric area, also nausea and vomiting and loose stools. Her labs revealed hypokalemia to 2.9 ,  mild elevation of liver enzymes, 200 and white blood cell count elevation. Ammonia level was also up to 59 . Her hemoglobin A1c was found to be 9.2 . Urine analysis revealed glucosuria and pyuria.  She was noted to be obtunded . CT scan of abdomen and pelvis revealed a masslike thickening of proximal stomach concerning for neoplasm, liver changes were also noted, most compatible with cirrhosis, mild right hydroureteronephrosis also noted without frank hydronephrosis of unclear etiology. Patient was seen by gastroenterologist, Dr. Gustavo Lah  while in the hospital who felt that patient's altered mental status likely due to sepsis and hyperammonemia. In regards to abdominal pain,  Dr. Gustavo Lah  felt that patient's abdominal pain likely related to diverticulitis. Patient was initiated on ciprofloxacin and Flagyl, also lactulose and Xifaxan with improvement of her overall condition. Patient's stool culture for C. difficile was checked and it was positive for C. difficile antigen but negative for C. difficile toxin, no, and enteric pathogens were isolated fever. Blood cultures were negative. Urine culture was not taken. In regards to abnormal CT finding of esophagitis gastroenterologist felt that Nissen fundoplication can explain the CT finding, so outpatient evaluation was recommended. Patient clinically improved. She was seen by physical therapist recommended skilled nursing facility placement for rehabilitation. She was felt to be stable to be discharged to rehabilitation 31st of October 2016  Discussion by problem  1. Metabolic encephalopathy possibly due to infection versus slow metabolism of opiates, improved now - CT of the head is normal, ABG normal, ammonia level improved  and, patient has no focal neurologic defects - Now she has her opiates  resumed in controlled way . Diverticulitis has been treated and infection is resolving.   #2: Gastrointestinal hemorrhage: Patient had several episodes of bright red blood per rectum on 10/26. She was examined by GI, felt to be  Likely trauma from lactulose enemas. This has been stopped. Hemoglobin did drop from 14-12. No further signs of bleeding. Continue PPI. Restarted Eliquis. Hemoglobin remains stable, no further bleeding. Patient is to follow-up with Dr. Gustavo Lah  as outpatient for further recommendations .   3. Abnormal CT scan of abdomen revealing masslike thickening of proximal stomach, Concern for gastric neoplasm: Unlikely per gastroenterology - CT scan showing a thickening of GE junction and cardia of the stomach, on review of older scans this is not a new finding - GI following, no EGD at this time, will need to hold eliquis prior to any procedure - Oncology consultation appreciated, felt to be not new finding, but EGD was recommended as outpatient.    #4 cirrhosis - Etiology unclear, her husband is an alcoholic but the patient is not. She does have a long history of pain medication use so may have had excessive Tylenol exposure over time - Has had elevated ammonia levels during this admission. Continue Xifaxan, off lactulose , follow diarrheal stool frequency,  Initial transaminases were  elevated, now improved, resume statin  5. Paroxysmal atrial fibrillation:  - Currently in NSR. Continue with metoprolol, Amiodarone, and apixaban  6. Type 2 diabetes non-insulin-requiring:  - Hold oral agents,  continue insulin sliding scale, now on Levemir, fasting blood glucose level was 132 today  #7 Clostridium difficile: C. difficile screen is equivocal. Flagyl started on 10/27 for likely Clostridium difficile infection. Contact precautions. Leukocytosis is improving with therapy, diarrhea is improving. Continue  Flagyl for 14 days orally, following clinically.   #8. Generalized weakness. Patient was seen by physical therapist and recommended skilled nursing facility placement, where she will be going likely next week, social worker is involved   #9 abdominal pain, felt to be  Diverticulitis by gastroenterologist. Patient received Cipro and Flagyl while in the hospital and completed course. Patient is to continue Flagyl now for 14 more days for suspected C. difficile enterocolitis.  #10 lower back pain, patient is to continue MS Contin in controlled fashion, add oxycodone as needed following mental status very closely     VITAL SIGNS:  Blood pressure 182/85, pulse 84, temperature 98.4 F (36.9 C), temperature source Oral, resp. rate 18, height 5' 3"  (1.6 m), weight 65.908 kg (145 lb 4.8 oz), SpO2 96 %.  I/O:   Intake/Output Summary (Last 24 hours) at 07/11/15 0843 Last data filed at 07/11/15 0747  Gross per 24 hour  Intake 1271.67 ml  Output      0 ml  Net 1271.67 ml    PHYSICAL EXAMINATION:  GENERAL:  59 y.o.-year-old patient lying in the bed with no acute distress.  EYES: Pupils equal, round, reactive to light and accommodation. No scleral icterus. Extraocular muscles intact.  HEENT: Head atraumatic, normocephalic. Oropharynx and nasopharynx clear.  NECK:  Supple, no jugular venous distention. No thyroid enlargement, no tenderness.  LUNGS: Normal breath sounds bilaterally, no wheezing, rales,rhonchi or crepitation. No use of accessory muscles of respiration.  CARDIOVASCULAR: S1, S2 normal. No murmurs, rubs, or gallops.  ABDOMEN: Soft, non-tender, non-distended. Bowel sounds present. No organomegaly or mass.  EXTREMITIES: No pedal edema, cyanosis, or clubbing.  NEUROLOGIC: Cranial nerves II through XII are intact. Muscle strength 5/5 in all extremities. Sensation intact. Gait not checked.  PSYCHIATRIC: The patient is alert and oriented x 3.  SKIN: No obvious rash, lesion, or ulcer.    DATA REVIEW:   CBC  Recent Labs Lab 07/11/15 0502  WBC 13.4*  HGB 12.3  HCT  36.3  PLT 133*    Chemistries   Recent Labs Lab 07/06/15 0414  07/09/15 0647  NA 139  < > 142  K 2.9*  < > 4.0  CL 109  < > 115*  CO2 19*  < > 22  GLUCOSE 284*  < > 150*  BUN 21*  < > 21*  CREATININE 0.94  < > 0.75  CALCIUM 8.5*  < > 8.4*  MG 1.6*  --   --   AST  --   < > 24  ALT  --   < > 47  ALKPHOS  --   < > 79  BILITOT  --   < > 0.9  < > = values in this interval not displayed.  Cardiac Enzymes No results for input(s): TROPONINI in the last 168 hours.  Microbiology Results  Results for orders placed or performed during the hospital encounter of 07/03/15  Stool culture     Status: None   Collection Time: 07/06/15  9:53 AM  Result Value Ref Range Status   Specimen Description STOOL  Final   Special Requests Normal  Final   Culture   Final    NO CAMPYLOBACTER DETECTED NO SALMONELLA OR SHIGELLA ISOLATED No Pathogenic E. coli detected    Report Status 07/08/2015 FINAL  Final  C difficile quick scan w PCR reflex     Status: Abnormal   Collection Time: 07/06/15  9:53 AM  Result Value Ref Range Status   C Diff antigen POSITIVE (A) NEGATIVE Final   C Diff toxin NEGATIVE NEGATIVE Final   C Diff interpretation   Final    Negative for toxigenic C. difficile. Toxin gene and active toxin production not detected. May be a nontoxigenic strain of C. difficile bacteria present, lacking the ability to produce toxin.  Clostridium Difficile by PCR     Status: None   Collection Time: 07/06/15  9:53 AM  Result Value Ref Range Status   Toxigenic C Difficile by pcr  NEGATIVE Final  Culture, blood (routine x 2)     Status: None (Preliminary result)   Collection Time: 07/06/15 10:50 AM  Result Value Ref Range Status   Specimen Description BLOOD LEFT ASSIST CONTROL  Final   Special Requests BOTTLES DRAWN AEROBIC AND ANAEROBIC [place]  Final   Culture NO GROWTH 4 DAYS  Final   Report Status PENDING   Incomplete  Culture, blood (routine x 2)     Status: None (Preliminary result)   Collection Time: 07/06/15  2:52 PM  Result Value Ref Range Status   Specimen Description BLOOD RIGHT ASSIST CONTROL  Final   Special Requests BAA,ANA,AER,5ML  Final   Culture NO GROWTH 4 DAYS  Final   Report Status PENDING  Incomplete    RADIOLOGY:  No results found.  EKG:   Orders placed or performed during the hospital encounter of 06/02/15  . EKG 12-Lead  . EKG 12-Lead  . ED EKG within 10 minutes  . ED EKG within 10 minutes  . EKG      Management plans discussed with the patient, family and they are in agreement.  CODE STATUS:     Code Status Orders        Start     Ordered   07/04/15 0253  Full code   Continuous     07/04/15 0253    Advance Directive Documentation        Most Recent Value   Type of Advance Directive  Healthcare  Power of [place] ]   Pre-existing out of facility DNR order (yellow form or pink MOST form)     "MOST" Form in Place?        TOTAL TIME TAKING CARE OF THIS PATIENT: 40 minutes.    Theodoro Grist M.D on 07/11/2015 at 8:43 AM  Between 7am to 6pm - Pager - [phone]  After 6pm go to [email or web address] - password [email or web address]  [facility]  Office  [phone]  CC: Primary care physician; Sheral Flow, NP

## 2015-07-12 LAB — CULTURE, BLOOD (ROUTINE X 2)
CULTURE: NO GROWTH
Culture: NO GROWTH

## 2015-07-13 DIAGNOSIS — I480 Paroxysmal atrial fibrillation: Secondary | ICD-10-CM | POA: Diagnosis not present

## 2015-07-13 DIAGNOSIS — A047 Enterocolitis due to Clostridium difficile: Secondary | ICD-10-CM | POA: Diagnosis not present

## 2015-07-13 DIAGNOSIS — M6281 Muscle weakness (generalized): Secondary | ICD-10-CM | POA: Diagnosis not present

## 2015-07-13 DIAGNOSIS — K219 Gastro-esophageal reflux disease without esophagitis: Secondary | ICD-10-CM | POA: Diagnosis not present

## 2015-07-13 DIAGNOSIS — M519 Unspecified thoracic, thoracolumbar and lumbosacral intervertebral disc disorder: Secondary | ICD-10-CM | POA: Diagnosis not present

## 2015-07-13 DIAGNOSIS — E109 Type 1 diabetes mellitus without complications: Secondary | ICD-10-CM | POA: Diagnosis not present

## 2015-07-13 DIAGNOSIS — I10 Essential (primary) hypertension: Secondary | ICD-10-CM | POA: Diagnosis not present

## 2015-07-13 DIAGNOSIS — R2681 Unsteadiness on feet: Secondary | ICD-10-CM | POA: Diagnosis not present

## 2015-07-13 DIAGNOSIS — K7460 Unspecified cirrhosis of liver: Secondary | ICD-10-CM | POA: Diagnosis not present

## 2015-07-21 ENCOUNTER — Telehealth: Payer: Self-pay

## 2015-07-21 ENCOUNTER — Other Ambulatory Visit: Payer: Self-pay | Admitting: Internal Medicine

## 2015-07-21 ENCOUNTER — Telehealth: Payer: Self-pay | Admitting: Primary Care

## 2015-07-21 DIAGNOSIS — K5900 Constipation, unspecified: Secondary | ICD-10-CM | POA: Diagnosis not present

## 2015-07-21 DIAGNOSIS — E785 Hyperlipidemia, unspecified: Secondary | ICD-10-CM | POA: Diagnosis not present

## 2015-07-21 DIAGNOSIS — Z7901 Long term (current) use of anticoagulants: Secondary | ICD-10-CM | POA: Diagnosis not present

## 2015-07-21 DIAGNOSIS — K219 Gastro-esophageal reflux disease without esophagitis: Secondary | ICD-10-CM | POA: Diagnosis not present

## 2015-07-21 DIAGNOSIS — Z7984 Long term (current) use of oral hypoglycemic drugs: Secondary | ICD-10-CM | POA: Diagnosis not present

## 2015-07-21 DIAGNOSIS — I509 Heart failure, unspecified: Secondary | ICD-10-CM | POA: Diagnosis not present

## 2015-07-21 DIAGNOSIS — I4891 Unspecified atrial fibrillation: Secondary | ICD-10-CM | POA: Diagnosis not present

## 2015-07-21 DIAGNOSIS — E1165 Type 2 diabetes mellitus with hyperglycemia: Secondary | ICD-10-CM | POA: Diagnosis not present

## 2015-07-21 DIAGNOSIS — I10 Essential (primary) hypertension: Secondary | ICD-10-CM | POA: Diagnosis not present

## 2015-07-21 DIAGNOSIS — H547 Unspecified visual loss: Secondary | ICD-10-CM | POA: Diagnosis not present

## 2015-07-21 DIAGNOSIS — Z794 Long term (current) use of insulin: Secondary | ICD-10-CM | POA: Diagnosis not present

## 2015-07-21 DIAGNOSIS — F329 Major depressive disorder, single episode, unspecified: Secondary | ICD-10-CM | POA: Diagnosis not present

## 2015-07-21 MED ORDER — INSULIN ASPART 100 UNIT/ML FLEXPEN: PEN_INJECTOR | SUBCUTANEOUS | Status: DC

## 2015-07-21 NOTE — Telephone Encounter (Signed)
Ann with [facility] left v/m; [facility] f/u with pts after discharge from hospital; pt told ann that pt having swelling in feet and ankles with blistering and abd swelling for 2 days. No difficulty breathing or CP. Pt is keeping legs elevated but is not wearing support hose. Lelon Frohlich said this is FYI to provider. Pt was seen 06/14/15 to establish care.

## 2015-07-21 NOTE — Telephone Encounter (Signed)
Monica NP with [facility] was at pts home earlier today; pt does not have syringes for giving sliding scale Novolog but pt would prefer the novolog flex pen instead to [place]. Monica request cb to pt.Please advise. [name] NP out of office and sent to [name] Silversmith NP.

## 2015-07-21 NOTE — Telephone Encounter (Signed)
If persists or worsens, she may need to follow up given comorbidities.

## 2015-07-21 NOTE — Telephone Encounter (Signed)
RX sent to pharmacy  

## 2015-07-22 ENCOUNTER — Telehealth: Payer: Self-pay | Admitting: Primary Care

## 2015-07-22 DIAGNOSIS — M6281 Muscle weakness (generalized): Secondary | ICD-10-CM | POA: Diagnosis not present

## 2015-07-22 DIAGNOSIS — E109 Type 1 diabetes mellitus without complications: Secondary | ICD-10-CM | POA: Diagnosis not present

## 2015-07-22 DIAGNOSIS — I480 Paroxysmal atrial fibrillation: Secondary | ICD-10-CM | POA: Diagnosis not present

## 2015-07-22 DIAGNOSIS — I10 Essential (primary) hypertension: Secondary | ICD-10-CM | POA: Diagnosis not present

## 2015-07-22 DIAGNOSIS — K7460 Unspecified cirrhosis of liver: Secondary | ICD-10-CM | POA: Diagnosis not present

## 2015-07-22 DIAGNOSIS — A047 Enterocolitis due to Clostridium difficile: Secondary | ICD-10-CM | POA: Diagnosis not present

## 2015-07-22 DIAGNOSIS — M519 Unspecified thoracic, thoracolumbar and lumbosacral intervertebral disc disorder: Secondary | ICD-10-CM | POA: Diagnosis not present

## 2015-07-22 DIAGNOSIS — R2681 Unsteadiness on feet: Secondary | ICD-10-CM | POA: Diagnosis not present

## 2015-07-22 DIAGNOSIS — K219 Gastro-esophageal reflux disease without esophagitis: Secondary | ICD-10-CM | POA: Diagnosis not present

## 2015-07-22 NOTE — Telephone Encounter (Signed)
[name] (PT) is requesting ongoing orders for PT.  1 time a week for 1 week  2 times a week for 6 weeks  cb number [phone] Thank you

## 2015-07-22 NOTE — Telephone Encounter (Signed)
[place] for verbal order as below

## 2015-07-22 NOTE — Telephone Encounter (Signed)
Orders given to April with [facility].

## 2015-07-22 NOTE — Telephone Encounter (Signed)
Ann notified via confidential voice mail.

## 2015-07-25 DIAGNOSIS — I10 Essential (primary) hypertension: Secondary | ICD-10-CM | POA: Diagnosis not present

## 2015-07-25 DIAGNOSIS — K7460 Unspecified cirrhosis of liver: Secondary | ICD-10-CM | POA: Diagnosis not present

## 2015-07-25 DIAGNOSIS — M519 Unspecified thoracic, thoracolumbar and lumbosacral intervertebral disc disorder: Secondary | ICD-10-CM | POA: Diagnosis not present

## 2015-07-25 DIAGNOSIS — I480 Paroxysmal atrial fibrillation: Secondary | ICD-10-CM | POA: Diagnosis not present

## 2015-07-25 DIAGNOSIS — A047 Enterocolitis due to Clostridium difficile: Secondary | ICD-10-CM | POA: Diagnosis not present

## 2015-07-25 DIAGNOSIS — K219 Gastro-esophageal reflux disease without esophagitis: Secondary | ICD-10-CM | POA: Diagnosis not present

## 2015-07-25 DIAGNOSIS — M6281 Muscle weakness (generalized): Secondary | ICD-10-CM | POA: Diagnosis not present

## 2015-07-25 DIAGNOSIS — R2681 Unsteadiness on feet: Secondary | ICD-10-CM | POA: Diagnosis not present

## 2015-07-25 DIAGNOSIS — E109 Type 1 diabetes mellitus without complications: Secondary | ICD-10-CM | POA: Diagnosis not present

## 2015-07-27 DIAGNOSIS — K219 Gastro-esophageal reflux disease without esophagitis: Secondary | ICD-10-CM | POA: Diagnosis not present

## 2015-07-27 DIAGNOSIS — R2681 Unsteadiness on feet: Secondary | ICD-10-CM | POA: Diagnosis not present

## 2015-07-27 DIAGNOSIS — I480 Paroxysmal atrial fibrillation: Secondary | ICD-10-CM | POA: Diagnosis not present

## 2015-07-27 DIAGNOSIS — I10 Essential (primary) hypertension: Secondary | ICD-10-CM | POA: Diagnosis not present

## 2015-07-27 DIAGNOSIS — M519 Unspecified thoracic, thoracolumbar and lumbosacral intervertebral disc disorder: Secondary | ICD-10-CM | POA: Diagnosis not present

## 2015-07-27 DIAGNOSIS — K7460 Unspecified cirrhosis of liver: Secondary | ICD-10-CM | POA: Diagnosis not present

## 2015-07-27 DIAGNOSIS — M6281 Muscle weakness (generalized): Secondary | ICD-10-CM | POA: Diagnosis not present

## 2015-07-27 DIAGNOSIS — E109 Type 1 diabetes mellitus without complications: Secondary | ICD-10-CM | POA: Diagnosis not present

## 2015-07-27 DIAGNOSIS — A047 Enterocolitis due to Clostridium difficile: Secondary | ICD-10-CM | POA: Diagnosis not present

## 2015-07-28 DIAGNOSIS — M6281 Muscle weakness (generalized): Secondary | ICD-10-CM | POA: Diagnosis not present

## 2015-07-28 DIAGNOSIS — I10 Essential (primary) hypertension: Secondary | ICD-10-CM | POA: Diagnosis not present

## 2015-07-28 DIAGNOSIS — K219 Gastro-esophageal reflux disease without esophagitis: Secondary | ICD-10-CM | POA: Diagnosis not present

## 2015-07-28 DIAGNOSIS — I480 Paroxysmal atrial fibrillation: Secondary | ICD-10-CM | POA: Diagnosis not present

## 2015-07-28 DIAGNOSIS — M519 Unspecified thoracic, thoracolumbar and lumbosacral intervertebral disc disorder: Secondary | ICD-10-CM | POA: Diagnosis not present

## 2015-07-28 DIAGNOSIS — K7460 Unspecified cirrhosis of liver: Secondary | ICD-10-CM | POA: Diagnosis not present

## 2015-07-28 DIAGNOSIS — A047 Enterocolitis due to Clostridium difficile: Secondary | ICD-10-CM | POA: Diagnosis not present

## 2015-07-28 DIAGNOSIS — R2681 Unsteadiness on feet: Secondary | ICD-10-CM | POA: Diagnosis not present

## 2015-07-28 DIAGNOSIS — E109 Type 1 diabetes mellitus without complications: Secondary | ICD-10-CM | POA: Diagnosis not present

## 2015-07-31 ENCOUNTER — Telehealth: Payer: Self-pay | Admitting: Primary Care

## 2015-07-31 ENCOUNTER — Emergency Department: Payer: Commercial Managed Care - HMO

## 2015-07-31 ENCOUNTER — Inpatient Hospital Stay
Admission: EM | Admit: 2015-07-31 | Discharge: 2015-08-06 | DRG: 388 | Disposition: A | Discharge status: Home / Self Care | Payer: Commercial Managed Care - HMO | Attending: Internal Medicine | Admitting: Internal Medicine

## 2015-07-31 ENCOUNTER — Encounter: Payer: Self-pay | Admitting: Emergency Medicine

## 2015-07-31 DIAGNOSIS — K219 Gastro-esophageal reflux disease without esophagitis: Secondary | ICD-10-CM | POA: Diagnosis present

## 2015-07-31 DIAGNOSIS — E119 Type 2 diabetes mellitus without complications: Secondary | ICD-10-CM

## 2015-07-31 DIAGNOSIS — K567 Ileus, unspecified: Principal | ICD-10-CM | POA: Diagnosis present

## 2015-07-31 DIAGNOSIS — R112 Nausea with vomiting, unspecified: Secondary | ICD-10-CM | POA: Diagnosis not present

## 2015-07-31 DIAGNOSIS — I959 Hypotension, unspecified: Secondary | ICD-10-CM

## 2015-07-31 DIAGNOSIS — Z888 Allergy status to other drugs, medicaments and biological substances status: Secondary | ICD-10-CM

## 2015-07-31 DIAGNOSIS — I482 Chronic atrial fibrillation: Secondary | ICD-10-CM | POA: Diagnosis not present

## 2015-07-31 DIAGNOSIS — E785 Hyperlipidemia, unspecified: Secondary | ICD-10-CM | POA: Diagnosis present

## 2015-07-31 DIAGNOSIS — Z9221 Personal history of antineoplastic chemotherapy: Secondary | ICD-10-CM | POA: Diagnosis not present

## 2015-07-31 DIAGNOSIS — I480 Paroxysmal atrial fibrillation: Secondary | ICD-10-CM | POA: Diagnosis present

## 2015-07-31 DIAGNOSIS — Z79891 Long term (current) use of opiate analgesic: Secondary | ICD-10-CM

## 2015-07-31 DIAGNOSIS — Z8673 Personal history of transient ischemic attack (TIA), and cerebral infarction without residual deficits: Secondary | ICD-10-CM | POA: Diagnosis not present

## 2015-07-31 DIAGNOSIS — J189 Pneumonia, unspecified organism: Secondary | ICD-10-CM | POA: Diagnosis present

## 2015-07-31 DIAGNOSIS — K5669 Other intestinal obstruction: Secondary | ICD-10-CM | POA: Diagnosis not present

## 2015-07-31 DIAGNOSIS — R1010 Upper abdominal pain, unspecified: Secondary | ICD-10-CM | POA: Diagnosis not present

## 2015-07-31 DIAGNOSIS — I10 Essential (primary) hypertension: Secondary | ICD-10-CM | POA: Diagnosis present

## 2015-07-31 DIAGNOSIS — Z887 Allergy status to serum and vaccine status: Secondary | ICD-10-CM | POA: Diagnosis not present

## 2015-07-31 DIAGNOSIS — Z885 Allergy status to narcotic agent status: Secondary | ICD-10-CM | POA: Diagnosis not present

## 2015-07-31 DIAGNOSIS — K7460 Unspecified cirrhosis of liver: Secondary | ICD-10-CM | POA: Diagnosis present

## 2015-07-31 DIAGNOSIS — E109 Type 1 diabetes mellitus without complications: Secondary | ICD-10-CM | POA: Diagnosis present

## 2015-07-31 DIAGNOSIS — R1084 Generalized abdominal pain: Secondary | ICD-10-CM | POA: Diagnosis not present

## 2015-07-31 DIAGNOSIS — E031 Congenital hypothyroidism without goiter: Secondary | ICD-10-CM | POA: Diagnosis not present

## 2015-07-31 DIAGNOSIS — Z8711 Personal history of peptic ulcer disease: Secondary | ICD-10-CM

## 2015-07-31 DIAGNOSIS — R001 Bradycardia, unspecified: Secondary | ICD-10-CM | POA: Diagnosis present

## 2015-07-31 DIAGNOSIS — Z8542 Personal history of malignant neoplasm of other parts of uterus: Secondary | ICD-10-CM | POA: Diagnosis not present

## 2015-07-31 DIAGNOSIS — R1310 Dysphagia, unspecified: Secondary | ICD-10-CM | POA: Diagnosis present

## 2015-07-31 DIAGNOSIS — R6881 Early satiety: Secondary | ICD-10-CM | POA: Diagnosis present

## 2015-07-31 DIAGNOSIS — F419 Anxiety disorder, unspecified: Secondary | ICD-10-CM | POA: Diagnosis present

## 2015-07-31 DIAGNOSIS — Z79899 Other long term (current) drug therapy: Secondary | ICD-10-CM

## 2015-07-31 DIAGNOSIS — Z794 Long term (current) use of insulin: Secondary | ICD-10-CM | POA: Diagnosis not present

## 2015-07-31 DIAGNOSIS — E039 Hypothyroidism, unspecified: Secondary | ICD-10-CM | POA: Diagnosis present

## 2015-07-31 DIAGNOSIS — Z881 Allergy status to other antibiotic agents status: Secondary | ICD-10-CM | POA: Diagnosis not present

## 2015-07-31 DIAGNOSIS — F329 Major depressive disorder, single episode, unspecified: Secondary | ICD-10-CM | POA: Diagnosis present

## 2015-07-31 DIAGNOSIS — I4891 Unspecified atrial fibrillation: Secondary | ICD-10-CM | POA: Diagnosis not present

## 2015-07-31 DIAGNOSIS — Z7901 Long term (current) use of anticoagulants: Secondary | ICD-10-CM | POA: Diagnosis not present

## 2015-07-31 DIAGNOSIS — R140 Abdominal distension (gaseous): Secondary | ICD-10-CM | POA: Diagnosis not present

## 2015-07-31 DIAGNOSIS — R188 Other ascites: Secondary | ICD-10-CM | POA: Diagnosis present

## 2015-07-31 DIAGNOSIS — R109 Unspecified abdominal pain: Secondary | ICD-10-CM | POA: Diagnosis present

## 2015-07-31 DIAGNOSIS — K56609 Unspecified intestinal obstruction, unspecified as to partial versus complete obstruction: Secondary | ICD-10-CM

## 2015-07-31 LAB — CBC WITH DIFFERENTIAL/PLATELET
BASOS ABS: 0 10*3/uL (ref 0–0.1)
Basophils Relative: 0 %
Eosinophils Absolute: 0 10*3/uL (ref 0–0.7)
Eosinophils Relative: 0 %
HEMATOCRIT: 40.5 % (ref 35.0–47.0)
Hemoglobin: 13.5 g/dL (ref 12.0–16.0)
LYMPHS PCT: 18 %
Lymphs Abs: 1.1 10*3/uL (ref 1.0–3.6)
MCH: 32.2 pg (ref 26.0–34.0)
MCHC: 33.2 g/dL (ref 32.0–36.0)
MCV: 96.8 fL (ref 80.0–100.0)
MONO ABS: 0.4 10*3/uL (ref 0.2–0.9)
MONOS PCT: 6 %
NEUTROS ABS: 4.6 10*3/uL (ref 1.4–6.5)
Neutrophils Relative %: 76 %
Platelets: 164 10*3/uL (ref 150–440)
RBC: 4.19 MIL/uL (ref 3.80–5.20)
RDW: 16.6 % — AB (ref 11.5–14.5)
WBC: 6.1 10*3/uL (ref 3.6–11.0)

## 2015-07-31 LAB — COMPREHENSIVE METABOLIC PANEL
ALT: 37 U/L (ref 14–54)
AST: 41 U/L (ref 15–41)
Albumin: 3.7 g/dL (ref 3.5–5.0)
Alkaline Phosphatase: 106 U/L (ref 38–126)
Anion gap: 10 (ref 5–15)
BILIRUBIN TOTAL: 1.5 mg/dL — AB (ref 0.3–1.2)
BUN: 12 mg/dL (ref 6–20)
CALCIUM: 8.7 mg/dL — AB (ref 8.9–10.3)
CO2: 25 mmol/L (ref 22–32)
CREATININE: 0.82 mg/dL (ref 0.44–1.00)
Chloride: 103 mmol/L (ref 101–111)
GFR calc Af Amer: >60 mL/min (ref >60)
Glucose, Bld: 182 mg/dL — ABNORMAL HIGH (ref 65–99)
POTASSIUM: 2.4 mmol/L — AB (ref 3.5–5.1)
Sodium: 138 mmol/L (ref 135–145)
TOTAL PROTEIN: 6.8 g/dL (ref 6.5–8.1)

## 2015-07-31 LAB — LIPASE, BLOOD: LIPASE: 46 U/L (ref 11–51)

## 2015-07-31 MED ORDER — IOHEXOL 240 MG/ML SOLN
25.0000 mL | Freq: Once | INTRAMUSCULAR | Status: AC | PRN
  Administered 2015-07-31: 25 mL via ORAL

## 2015-07-31 MED ORDER — SODIUM CHLORIDE 0.9 % IV BOLUS (SEPSIS)
500.0000 mL | Freq: Once | INTRAVENOUS | Status: AC
  Administered 2015-07-31: 500 mL via INTRAVENOUS

## 2015-07-31 MED ORDER — HYDROMORPHONE HCL 1 MG/ML IJ SOLN
INTRAMUSCULAR | Status: AC
  Administered 2015-07-31: 0.5 mg via INTRAVENOUS
  Filled 2015-07-31: qty 1

## 2015-07-31 MED ORDER — FENTANYL CITRATE (PF) 100 MCG/2ML IJ SOLN
INTRAMUSCULAR | Status: AC
  Administered 2015-07-31: 75 ug via INTRAVENOUS
  Filled 2015-07-31: qty 2

## 2015-07-31 MED ORDER — MORPHINE SULFATE (PF) 4 MG/ML IV SOLN
INTRAVENOUS | Status: AC
  Administered 2015-07-31: 4 mg via INTRAVENOUS
  Filled 2015-07-31: qty 1

## 2015-07-31 MED ORDER — ONDANSETRON HCL 4 MG/2ML IJ SOLN
4.0000 mg | Freq: Once | INTRAMUSCULAR | Status: AC
  Administered 2015-07-31: 4 mg via INTRAVENOUS

## 2015-07-31 MED ORDER — MORPHINE SULFATE (PF) 4 MG/ML IV SOLN
4.0000 mg | Freq: Once | INTRAVENOUS | Status: AC
  Administered 2015-07-31: 4 mg via INTRAVENOUS

## 2015-07-31 MED ORDER — IOHEXOL 300 MG/ML  SOLN
50.0000 mL | Freq: Once | INTRAMUSCULAR | Status: AC | PRN
  Administered 2015-07-31: 100 mL via INTRAVENOUS

## 2015-07-31 MED ORDER — FENTANYL CITRATE (PF) 100 MCG/2ML IJ SOLN
75.0000 ug | Freq: Once | INTRAMUSCULAR | Status: AC
  Administered 2015-07-31: 75 ug via INTRAVENOUS

## 2015-07-31 MED ORDER — POTASSIUM CHLORIDE 10 MEQ/100ML IV SOLN
10.0000 meq | INTRAVENOUS | Status: AC
  Administered 2015-07-31 (×2): 10 meq via INTRAVENOUS
  Filled 2015-07-31 (×2): qty 100

## 2015-07-31 MED ORDER — HYDROMORPHONE HCL 1 MG/ML IJ SOLN
0.5000 mg | Freq: Once | INTRAMUSCULAR | Status: AC
  Administered 2015-07-31: 0.5 mg via INTRAVENOUS

## 2015-07-31 MED ORDER — ONDANSETRON HCL 4 MG/2ML IJ SOLN
INTRAMUSCULAR | Status: AC
  Administered 2015-07-31: 4 mg via INTRAVENOUS
  Filled 2015-07-31: qty 2

## 2015-07-31 NOTE — Telephone Encounter (Signed)
Noted and appropriate.

## 2015-07-31 NOTE — ED Notes (Signed)
Patient transported to X-ray 

## 2015-07-31 NOTE — ED Notes (Signed)
Pt presents today via [facility]. Per EMS pt has had bloating x 4 days, and N/D x 2 days. Per EMS pt finished Metronidazole 2 days ago for treatment of C-diff for which patient was seen here for. EMS states that according to patient she has been having mustard color-stools for 2 days. Pt presents alert and oriented, with pain 10/10 at this time. Pt placed on enteric precautions due to hx of c-diff.

## 2015-07-31 NOTE — ED Provider Notes (Signed)
[facility] Emergency Department Provider Note  ____________________________________________  Time seen: 53  I have reviewed the triage vital signs and the nursing notes.   HISTORY  Chief Complaint Abdominal Pain and Diarrhea   History limited by: Not Limited   HPI [name] is a 59 y.o. female who presents to the emergency department with concerns for abdominal distention, pain and diarrhea. The patient states that the symptoms started 4 days ago they have been getting progressively worse. She states that she has had nausea and vomiting. She states that the symptoms remind her of her recent episode of Clostridium difficile. The patient states that she has recently finished antibiotics for that. She denies any fevers. She denied anything that made the pain better or worse.     Past Medical History  Diagnosis Date  . Type 1 diabetes ([place])   . Hypothyroid   . Degenerative disk disease   . Stomach ulcer   . Diverticulitis   . Syncope 01/2015  . Anxiety   . GERD (gastroesophageal reflux disease)   . History of hiatal hernia   . Cancer (HCC)     HX OF CANCER OF UTERUS   . TIA (transient ischemic attack) 02/2015  . Hypertension   . Pancreatitis   . PAF (paroxysmal atrial fibrillation) ([place]) 03/2015    a. new onset 03/2015 in setting of intractable N/V; b. on Eliquis 5 mg bid; c. CHADSVASc 4 (DM, TIA x 2, female)  . Intussusception intestine ([place]) 05/2015    Patient Active Problem List   Diagnosis Date Noted  . Encephalopathy, metabolic 51/88/4166  . Liver cirrhosis ([place]) 07/10/2015  . Paroxysmal atrial fibrillation ([place]) 07/10/2015  . Diabetes mellitus ([place]) 07/10/2015  . C. difficile colitis 07/10/2015  . Generalized weakness 07/10/2015  . GI (gastrointestinal bleed) 07/06/2015  . Intractable nausea and vomiting 07/04/2015  . Malnutrition of moderate degree 07/04/2015  . Chest pain   . Symptomatic bradycardia 05/27/2015  . Acute  encephalopathy 05/27/2015  . Elevated troponin 05/27/2015  . Intussusception intestine ([place])   . Other specified hypothyroidism   . Abdominal pain, chronic, epigastric   . Diarrhea 05/19/2015  . Chronic anemia 05/19/2015  . Thrombocytopenia ([place]) 05/19/2015  . ARF (acute renal failure) ([place]) 05/19/2015  . Prolonged QT interval   . Hypomagnesemia   . Colitis   . Narcotic abuse   . Type 2 diabetes mellitus without complication ([place])   . Syncope 05/18/2015  . PAF (paroxysmal atrial fibrillation) ([place])   . Demand ischemia ([place]) 04/06/2015  . Hypokalemia 04/06/2015  . Hyperlipidemia with target LDL less than 100 04/06/2015  . Protein-calorie malnutrition, severe ([place]) 04/05/2015  . CVA (cerebral infarction) 02/15/2015  . Dehydration 01/12/2015  . Essential hypertension 01/12/2015  . Orthostatic hypotension 01/12/2015  . Chronically on opiate therapy 01/12/2015  . Gastroparesis 01/12/2015  . DEPRESSION/ANXIETY 06/27/2007  . MYOFASCIAL PAIN SYNDROME 06/27/2007  . Chronic pain syndrome 03/28/2007  . GERD 03/27/2007  . DIVERTICULOSIS, COLON 03/27/2007  . LUMBAR DISC DISPLACEMENT 03/27/2007  . PROTEINURIA 03/27/2007  . UTERINE CANCER, HX OF 03/27/2007    Past Surgical History  Procedure Laterality Date  . Hernia repair    . Abdominal hysterectomy    . Cholecystectomy    . Esophagogastroduodenoscopy N/A 04/04/2015    Procedure: ESOPHAGOGASTRODUODENOSCOPY (EGD);  Surgeon: Hulen Luster, MD;  Location: [facility] ENDOSCOPY;  Service: Endoscopy;  Laterality: N/A;    Current Outpatient Rx  Name  Route  Sig  Dispense  Refill  .  amiodarone (PACERONE) 200 MG tablet   Oral   Take 1 tablet (200 mg total) by mouth daily.   30 tablet   0   . apixaban (ELIQUIS) 5 MG TABS tablet   Oral   Take 1 tablet (5 mg total) by mouth 2 (two) times daily.   60 tablet   0   . atorvastatin (LIPITOR) 40 MG tablet   Oral   Take 1 tablet (40 mg total) by mouth daily at 6 PM.   30 tablet   0   .  citalopram (CELEXA) 20 MG tablet   Oral   Take 20 mg by mouth daily.         Marland Kitchen dicyclomine (BENTYL) 10 MG capsule   Oral   Take 1 capsule by mouth 4 (four) times daily - after meals and at bedtime.          . feeding supplement (BOOST / RESOURCE BREEZE) LIQD   Oral   Take 1 Container by mouth 3 (three) times daily with meals.   90 Container   6   . insulin aspart (NOVOLOG) 100 UNIT/ML FlexPen      Inject 0-15 mL into the skin 3 x day with meals, according to sliding scale   15 mL   11   . insulin detemir (LEVEMIR) 100 UNIT/ML injection   Subcutaneous   Inject 0.17 mLs (17 Units total) into the skin daily.   10 mL   11   . lisinopril (PRINIVIL,ZESTRIL) 10 MG tablet   Oral   Take 0.5 tablets (5 mg total) by mouth 2 (two) times daily.   60 tablet   6   . metoprolol succinate (TOPROL-XL) 25 MG 24 hr tablet   Oral   Take 1 tablet (25 mg total) by mouth daily.   30 tablet   0   . metroNIDAZOLE (FLAGYL) 500 MG tablet   Oral   Take 1 tablet (500 mg total) by mouth every 8 (eight) hours.   42 tablet   0     Flagyl 500 mg 3 times daily for  14 days   . midodrine (PROAMATINE) 10 MG tablet   Oral   Take 1 tablet (10 mg total) by mouth 3 (three) times daily with meals.   90 tablet   0   . morphine (MS CONTIN) 60 MG 12 hr tablet   Oral   Take 1 tablet (60 mg total) by mouth 2 (two) times daily.   60 tablet   0   . pantoprazole (PROTONIX) 40 MG tablet   Oral   Take 1 tablet (40 mg total) by mouth 2 (two) times daily. Patient not taking: Reported on 07/05/2015   60 tablet   0   . promethazine (PHENERGAN) 25 MG tablet   Oral   Take 25 mg by mouth every 6 (six) hours as needed for nausea or vomiting.         . promethazine (PHENERGAN) 25 MG tablet   Oral   Take 1 tablet (25 mg total) by mouth every 6 (six) hours as needed for nausea or vomiting.   15 tablet   0   . psyllium (METAMUCIL) 58.6 % packet   Oral   Take 1 packet by mouth every other day.          . ranitidine (ZANTAC) 150 MG capsule   Oral   Take 1 capsule (150 mg total) by mouth 2 (two) times daily.   28 capsule  0   . rifaximin (XIFAXAN) 550 MG TABS tablet   Oral   Take 1 tablet (550 mg total) by mouth 2 (two) times daily.   60 tablet   6   . sucralfate (CARAFATE) 1 G tablet   Oral   Take 1 tablet (1 g total) by mouth 4 (four) times daily.   120 tablet   1   . tiZANidine (ZANAFLEX) 4 MG tablet   Oral   Take 4 mg by mouth every 6 (six) hours.           Allergies Erythromycin base; Rosiglitazone maleate; Codeine sulfate; and Tetanus-diphtheria toxoids td  Family History  Problem Relation Age of Onset  . Hypertension Mother   . CAD Sister     Social History Social History  Substance Use Topics  . Smoking status: Never Smoker   . Smokeless tobacco: Never Used  . Alcohol Use: No    Review of Systems  Constitutional: Negative for fever. Cardiovascular: Negative for chest pain. Respiratory: Negative for shortness of breath. Gastrointestinal: Positive for abdominal pain, distention, nausea and vomiting.  Genitourinary: Negative for dysuria. Musculoskeletal: Negative for back pain. Skin: Negative for rash. Neurological: Negative for headaches, focal weakness or numbness.   10-point ROS otherwise negative.  ____________________________________________   PHYSICAL EXAM:  VITAL SIGNS: ED Triage Vitals  Enc Vitals Group     BP 07/31/15 1818 169/101 mmHg     Pulse Rate 07/31/15 1818 100     Resp 07/31/15 1818 26     Temp 07/31/15 1818 98 F (36.7 C)     Temp Source 07/31/15 1818 Oral     SpO2 07/31/15 1818 97 %     Weight 07/31/15 1818 145 lb (65.772 kg)     Height 07/31/15 1818 5' 4"  (1.626 m)     Head Cir --      Peak Flow --      Pain Score 07/31/15 1818 10   Constitutional: Alert and oriented. Well appearing and in no distress. Eyes: Conjunctivae are normal. PERRL. Normal extraocular movements. ENT   Head: Normocephalic and  atraumatic.   Nose: No congestion/rhinnorhea.   Mouth/Throat: Mucous membranes are moist.   Neck: No stridor. Hematological/Lymphatic/Immunilogical: No cervical lymphadenopathy. Cardiovascular: Normal rate, regular rhythm.  No murmurs, rubs, or gallops. Respiratory: Normal respiratory effort without tachypnea nor retractions. Breath sounds are clear and equal bilaterally. No wheezes/rales/rhonchi. Gastrointestinal: Soft, distended, tender to palpation diffusely.  Genitourinary: Deferred Musculoskeletal: Normal range of motion in all extremities. No joint effusions.  No lower extremity tenderness nor edema. Neurologic:  Normal speech and language. No gross focal neurologic deficits are appreciated.  Skin:  Skin is warm, dry and intact. No rash noted. Psychiatric: Mood and affect are normal. Speech and behavior are normal. Patient exhibits appropriate insight and judgment.  ____________________________________________    LABS (pertinent positives/negatives)  WBC 6.1 Hgb 13.5 Na 138 K 2.4   ____________________________________________   EKG  I, Nance Pear, attending physician, personally viewed and interpreted this EKG  EKG Time: 1819 Rate: 96 Rhythm: normal sinus rhythm Axis: normal Intervals: qtc 475 QRS: narrow, q waves V1, V2, V3, III ST changes: no st elevation Impression: abnormal ekg ____________________________________________    RADIOLOGY  Abd x-rays IMPRESSION: Volume loss, atelectasis, and small effusion in the right lung base likely representing pneumonia. Indeterminate 2 cm right mid lung nodular opacity. This probably represents an EKG lead.  Distended gas-filled mid abdominal small bowel with air-fluid levels suggesting partial obstruction.  CT abd/pel  IMPRESSION: Small bilateral pleural effusions with infiltration or atelectasis in the lung bases. Changes of hepatic cirrhosis with prominent diffuse abdominal ascites, increasing  since previous study. Proximal small bowel distention with transition zone to decompressed bowel in the left mid abdomen. Changes are consistent with partial obstruction. Filling defect in the cardia/lesser curvature of the stomach again demonstrated.   ____________________________________________   PROCEDURES  Procedure(s) performed: None  Critical Care performed: No  ____________________________________________   INITIAL IMPRESSION / ASSESSMENT AND PLAN / ED COURSE  Pertinent labs & imaging results that were available during my care of the patient were reviewed by me and considered in my medical decision making (see chart for details).  Patient presented to the emergency department today because of concerns for abdominal pain and distention. On exam patient abdomen is soft however it is distended. Initial x-rays did raise concern for partial small bowel obstruction. A CT scan was done which did show ascites and potentially Ace partial small bowel obstruction. Because of this surgery was consulted over the felt that the patient's symptoms were unlikely secondary to a partial small bowel obstruction. The patient will be admitted to the medicine service for further workup and evaluation.  ____________________________________________   FINAL CLINICAL IMPRESSION(S) / ED DIAGNOSES  Final diagnoses:  Abdominal pain, unspecified abdominal location     Nance Pear, MD 08/01/15 1713

## 2015-07-31 NOTE — ED Notes (Signed)
Patient back from x-ray 

## 2015-07-31 NOTE — ED Notes (Signed)
Report given to [place], [place].

## 2015-08-01 ENCOUNTER — Inpatient Hospital Stay: Payer: Commercial Managed Care - HMO

## 2015-08-01 DIAGNOSIS — Z794 Long term (current) use of insulin: Secondary | ICD-10-CM | POA: Diagnosis not present

## 2015-08-01 DIAGNOSIS — Z885 Allergy status to narcotic agent status: Secondary | ICD-10-CM | POA: Diagnosis not present

## 2015-08-01 DIAGNOSIS — Z888 Allergy status to other drugs, medicaments and biological substances status: Secondary | ICD-10-CM | POA: Diagnosis not present

## 2015-08-01 DIAGNOSIS — E785 Hyperlipidemia, unspecified: Secondary | ICD-10-CM | POA: Diagnosis present

## 2015-08-01 DIAGNOSIS — I959 Hypotension, unspecified: Secondary | ICD-10-CM | POA: Diagnosis not present

## 2015-08-01 DIAGNOSIS — K567 Ileus, unspecified: Secondary | ICD-10-CM | POA: Diagnosis present

## 2015-08-01 DIAGNOSIS — Z79899 Other long term (current) drug therapy: Secondary | ICD-10-CM | POA: Diagnosis not present

## 2015-08-01 DIAGNOSIS — R1310 Dysphagia, unspecified: Secondary | ICD-10-CM | POA: Diagnosis present

## 2015-08-01 DIAGNOSIS — R6881 Early satiety: Secondary | ICD-10-CM | POA: Diagnosis present

## 2015-08-01 DIAGNOSIS — Z8673 Personal history of transient ischemic attack (TIA), and cerebral infarction without residual deficits: Secondary | ICD-10-CM | POA: Diagnosis not present

## 2015-08-01 DIAGNOSIS — R1084 Generalized abdominal pain: Secondary | ICD-10-CM

## 2015-08-01 DIAGNOSIS — K7460 Unspecified cirrhosis of liver: Secondary | ICD-10-CM | POA: Diagnosis present

## 2015-08-01 DIAGNOSIS — Z887 Allergy status to serum and vaccine status: Secondary | ICD-10-CM | POA: Diagnosis not present

## 2015-08-01 DIAGNOSIS — R109 Unspecified abdominal pain: Secondary | ICD-10-CM

## 2015-08-01 DIAGNOSIS — E109 Type 1 diabetes mellitus without complications: Secondary | ICD-10-CM | POA: Diagnosis present

## 2015-08-01 DIAGNOSIS — K219 Gastro-esophageal reflux disease without esophagitis: Secondary | ICD-10-CM | POA: Diagnosis present

## 2015-08-01 DIAGNOSIS — J189 Pneumonia, unspecified organism: Secondary | ICD-10-CM | POA: Diagnosis present

## 2015-08-01 DIAGNOSIS — Z9221 Personal history of antineoplastic chemotherapy: Secondary | ICD-10-CM | POA: Diagnosis not present

## 2015-08-01 DIAGNOSIS — Z7901 Long term (current) use of anticoagulants: Secondary | ICD-10-CM | POA: Diagnosis not present

## 2015-08-01 DIAGNOSIS — R188 Other ascites: Secondary | ICD-10-CM | POA: Diagnosis present

## 2015-08-01 DIAGNOSIS — R1010 Upper abdominal pain, unspecified: Secondary | ICD-10-CM | POA: Diagnosis not present

## 2015-08-01 DIAGNOSIS — E039 Hypothyroidism, unspecified: Secondary | ICD-10-CM | POA: Diagnosis present

## 2015-08-01 DIAGNOSIS — I480 Paroxysmal atrial fibrillation: Secondary | ICD-10-CM | POA: Diagnosis present

## 2015-08-01 DIAGNOSIS — Z881 Allergy status to other antibiotic agents status: Secondary | ICD-10-CM | POA: Diagnosis not present

## 2015-08-01 DIAGNOSIS — F329 Major depressive disorder, single episode, unspecified: Secondary | ICD-10-CM | POA: Diagnosis present

## 2015-08-01 DIAGNOSIS — I10 Essential (primary) hypertension: Secondary | ICD-10-CM | POA: Diagnosis present

## 2015-08-01 DIAGNOSIS — F419 Anxiety disorder, unspecified: Secondary | ICD-10-CM | POA: Diagnosis present

## 2015-08-01 DIAGNOSIS — R001 Bradycardia, unspecified: Secondary | ICD-10-CM | POA: Diagnosis present

## 2015-08-01 DIAGNOSIS — Z8542 Personal history of malignant neoplasm of other parts of uterus: Secondary | ICD-10-CM | POA: Diagnosis not present

## 2015-08-01 DIAGNOSIS — K5669 Other intestinal obstruction: Secondary | ICD-10-CM | POA: Diagnosis not present

## 2015-08-01 DIAGNOSIS — Z8711 Personal history of peptic ulcer disease: Secondary | ICD-10-CM | POA: Diagnosis not present

## 2015-08-01 DIAGNOSIS — Z79891 Long term (current) use of opiate analgesic: Secondary | ICD-10-CM | POA: Diagnosis not present

## 2015-08-01 HISTORY — DX: Ileus, unspecified: K56.7

## 2015-08-01 LAB — URINALYSIS COMPLETE WITH MICROSCOPIC (ARMC ONLY)
Bilirubin Urine: NEGATIVE
GLUCOSE, UA: 50 mg/dL — AB
Nitrite: NEGATIVE
Protein, ur: 30 mg/dL — AB
Specific Gravity, Urine: 1.031 — ABNORMAL HIGH (ref 1.005–1.030)
pH: 7 (ref 5.0–8.0)

## 2015-08-01 LAB — COMPREHENSIVE METABOLIC PANEL
ALK PHOS: 75 U/L (ref 38–126)
ALT: 25 U/L (ref 14–54)
ANION GAP: 7 (ref 5–15)
AST: 33 U/L (ref 15–41)
Albumin: 2.5 g/dL — ABNORMAL LOW (ref 3.5–5.0)
BUN: 10 mg/dL (ref 6–20)
CALCIUM: 7.6 mg/dL — AB (ref 8.9–10.3)
CO2: 24 mmol/L (ref 22–32)
Chloride: 106 mmol/L (ref 101–111)
Creatinine, Ser: 0.65 mg/dL (ref 0.44–1.00)
GFR calc non Af Amer: >60 mL/min (ref >60)
Glucose, Bld: 139 mg/dL — ABNORMAL HIGH (ref 65–99)
Potassium: 2.8 mmol/L — CL (ref 3.5–5.1)
SODIUM: 137 mmol/L (ref 135–145)
Total Bilirubin: 1 mg/dL (ref 0.3–1.2)
Total Protein: 4.5 g/dL — ABNORMAL LOW (ref 6.5–8.1)

## 2015-08-01 LAB — PROTIME-INR
INR: 1.58
Prothrombin Time: 18.9 seconds — ABNORMAL HIGH (ref 11.4–15.0)

## 2015-08-01 LAB — CBC
HEMATOCRIT: 29.1 % — AB (ref 35.0–47.0)
Hemoglobin: 9.4 g/dL — ABNORMAL LOW (ref 12.0–16.0)
MCH: 31.5 pg (ref 26.0–34.0)
MCHC: 32.2 g/dL (ref 32.0–36.0)
MCV: 97.6 fL (ref 80.0–100.0)
Platelets: 104 10*3/uL — ABNORMAL LOW (ref 150–440)
RBC: 2.98 MIL/uL — AB (ref 3.80–5.20)
RDW: 16.6 % — ABNORMAL HIGH (ref 11.5–14.5)
WBC: 4.6 10*3/uL (ref 3.6–11.0)

## 2015-08-01 LAB — BODY FLUID CELL COUNT WITH DIFFERENTIAL
Eos, Fluid: 0 %
Lymphs, Fluid: 22 %
Monocyte-Macrophage-Serous Fluid: 78 %
Neutrophil Count, Fluid: 0 %
Other Cells, Fluid: 0 %
Total Nucleated Cell Count, Fluid: 50 cu mm

## 2015-08-01 LAB — GLUCOSE, CAPILLARY
Glucose-Capillary: 143 mg/dL — ABNORMAL HIGH (ref 65–99)
Glucose-Capillary: 128 mg/dL — ABNORMAL HIGH (ref 65–99)
Glucose-Capillary: 114 mg/dL — ABNORMAL HIGH (ref 65–99)
Glucose-Capillary: 181 mg/dL — ABNORMAL HIGH (ref 65–99)

## 2015-08-01 LAB — T4, FREE: FREE T4: 0.58 ng/dL — AB (ref 0.61–1.12)

## 2015-08-01 LAB — GLUCOSE, SEROUS FLUID: Glucose, Fluid: 133 mg/dL

## 2015-08-01 LAB — LACTATE DEHYDROGENASE, PLEURAL OR PERITONEAL FLUID: LD, Fluid: 29 U/L — ABNORMAL HIGH (ref 3–23)

## 2015-08-01 LAB — ALBUMIN, FLUID (OTHER)

## 2015-08-01 LAB — AMYLASE, BODY FLUID: Amylase, Fluid: 54 U/L

## 2015-08-01 LAB — TSH: TSH: 61.012 u[IU]/mL — ABNORMAL HIGH (ref 0.350–4.500)

## 2015-08-01 LAB — MAGNESIUM: Magnesium: 1.9 mg/dL (ref 1.7–2.4)

## 2015-08-01 LAB — PROTEIN, BODY FLUID: Total protein, fluid: <3 g/dL

## 2015-08-01 LAB — AMYLASE: Amylase: 28 U/L (ref 28–100)

## 2015-08-01 MED ORDER — LISINOPRIL 5 MG PO TABS
5.0000 mg | ORAL_TABLET | Freq: Two times a day (BID) | ORAL | Status: DC
  Administered 2015-08-01: 5 mg via ORAL
  Filled 2015-08-01: qty 1

## 2015-08-01 MED ORDER — SPIRONOLACTONE 100 MG PO TABS
100.0000 mg | ORAL_TABLET | Freq: Every day | ORAL | Status: DC
  Administered 2015-08-01: 100 mg via ORAL
  Filled 2015-08-01: qty 1

## 2015-08-01 MED ORDER — TIZANIDINE HCL 4 MG PO TABS
4.0000 mg | ORAL_TABLET | Freq: Four times a day (QID) | ORAL | Status: DC
  Administered 2015-08-01 – 2015-08-02 (×4): 4 mg via ORAL
  Filled 2015-08-01 (×4): qty 1

## 2015-08-01 MED ORDER — LEVOTHYROXINE SODIUM 100 MCG PO TABS: 100.0000 ug | ORAL_TABLET | Freq: Every day | ORAL | Status: DC

## 2015-08-01 MED ORDER — FUROSEMIDE 40 MG PO TABS
40.0000 mg | ORAL_TABLET | Freq: Every day | ORAL | Status: DC
  Administered 2015-08-01: 40 mg via ORAL
  Filled 2015-08-01: qty 1

## 2015-08-01 MED ORDER — ATORVASTATIN CALCIUM 20 MG PO TABS
40.0000 mg | ORAL_TABLET | Freq: Every day | ORAL | Status: DC
Start: 2015-08-01 — End: 2015-08-01

## 2015-08-01 MED ORDER — MIDODRINE HCL 5 MG PO TABS: 10.0000 mg | ORAL_TABLET | Freq: Three times a day (TID) | ORAL | Status: DC

## 2015-08-01 MED ORDER — DOCUSATE SODIUM 100 MG PO CAPS
100.0000 mg | ORAL_CAPSULE | Freq: Two times a day (BID) | ORAL | Status: DC
  Administered 2015-08-01 – 2015-08-06 (×9): 100 mg via ORAL
  Filled 2015-08-01 (×10): qty 1

## 2015-08-01 MED ORDER — POTASSIUM CHLORIDE CRYS ER 20 MEQ PO TBCR
40.0000 meq | EXTENDED_RELEASE_TABLET | ORAL | Status: DC
  Administered 2015-08-01 (×2): 40 meq via ORAL
  Filled 2015-08-01 (×2): qty 2

## 2015-08-01 MED ORDER — MORPHINE SULFATE ER 15 MG PO TBCR
60.0000 mg | EXTENDED_RELEASE_TABLET | Freq: Two times a day (BID) | ORAL | Status: DC
  Administered 2015-08-01 – 2015-08-02 (×2): 60 mg via ORAL
  Filled 2015-08-01 (×2): qty 4

## 2015-08-01 MED ORDER — SUCRALFATE 1 G PO TABS
1.0000 g | ORAL_TABLET | Freq: Four times a day (QID) | ORAL | Status: DC
  Administered 2015-08-01 – 2015-08-06 (×20): 1 g via ORAL
  Filled 2015-08-01 (×20): qty 1

## 2015-08-01 MED ORDER — SODIUM CHLORIDE 0.9 % IV BOLUS (SEPSIS)
1000.0000 mL | Freq: Once | INTRAVENOUS | Status: AC
  Administered 2015-08-01: 1000 mL via INTRAVENOUS

## 2015-08-01 MED ORDER — METOPROLOL SUCCINATE ER 25 MG PO TB24: 25.0000 mg | ORAL_TABLET | Freq: Every day | ORAL | Status: DC

## 2015-08-01 MED ORDER — PSYLLIUM 95 % PO PACK
1.0000 | PACK | ORAL | Status: DC
  Administered 2015-08-01 – 2015-08-05 (×2): 1 via ORAL
  Filled 2015-08-01 (×3): qty 1

## 2015-08-01 MED ORDER — PROMETHAZINE HCL 25 MG PO TABS
25.0000 mg | ORAL_TABLET | Freq: Four times a day (QID) | ORAL | Status: DC | PRN
  Administered 2015-08-02 – 2015-08-03 (×3): 25 mg via ORAL
  Filled 2015-08-01 (×3): qty 1

## 2015-08-01 MED ORDER — CITALOPRAM HYDROBROMIDE 20 MG PO TABS
20.0000 mg | ORAL_TABLET | Freq: Every day | ORAL | Status: DC
  Administered 2015-08-01 – 2015-08-06 (×6): 20 mg via ORAL
  Filled 2015-08-01 (×6): qty 1

## 2015-08-01 MED ORDER — PANTOPRAZOLE SODIUM 40 MG PO TBEC
40.0000 mg | DELAYED_RELEASE_TABLET | Freq: Two times a day (BID) | ORAL | Status: DC
  Administered 2015-08-01 – 2015-08-06 (×10): 40 mg via ORAL
  Filled 2015-08-01 (×10): qty 1

## 2015-08-01 MED ORDER — INSULIN ASPART 100 UNIT/ML ~~LOC~~ SOLN
0.0000 [IU] | Freq: Three times a day (TID) | SUBCUTANEOUS | Status: DC
  Administered 2015-08-01: 1 [IU] via SUBCUTANEOUS
  Administered 2015-08-02 (×2): 2 [IU] via SUBCUTANEOUS
  Administered 2015-08-02: 1 [IU] via SUBCUTANEOUS
  Administered 2015-08-04 – 2015-08-05 (×2): 2 [IU] via SUBCUTANEOUS
  Administered 2015-08-05: 5 [IU] via SUBCUTANEOUS
  Administered 2015-08-05: 2 [IU] via SUBCUTANEOUS
  Administered 2015-08-06 (×2): 1 [IU] via SUBCUTANEOUS
  Filled 2015-08-01: qty 2
  Filled 2015-08-01 (×4): qty 1
  Filled 2015-08-01: qty 5
  Filled 2015-08-01 (×5): qty 2

## 2015-08-01 MED ORDER — METOPROLOL SUCCINATE ER 50 MG PO TB24
50.0000 mg | ORAL_TABLET | Freq: Every day | ORAL | Status: DC
  Administered 2015-08-01: 50 mg via ORAL
  Filled 2015-08-01: qty 1

## 2015-08-01 MED ORDER — ONDANSETRON HCL 4 MG PO TABS
4.0000 mg | ORAL_TABLET | Freq: Four times a day (QID) | ORAL | Status: DC | PRN
  Administered 2015-08-02 – 2015-08-04 (×5): 4 mg via ORAL
  Filled 2015-08-01 (×5): qty 1

## 2015-08-01 MED ORDER — ONDANSETRON HCL 4 MG/2ML IJ SOLN
4.0000 mg | Freq: Four times a day (QID) | INTRAMUSCULAR | Status: DC | PRN
  Administered 2015-08-01 (×4): 4 mg via INTRAVENOUS
  Filled 2015-08-01 (×4): qty 2

## 2015-08-01 MED ORDER — MORPHINE SULFATE (PF) 4 MG/ML IV SOLN
4.0000 mg | Freq: Once | INTRAVENOUS | Status: AC
  Administered 2015-08-01: 4 mg via INTRAVENOUS
  Filled 2015-08-01: qty 1

## 2015-08-01 MED ORDER — SODIUM CHLORIDE 0.9 % IJ SOLN
3.0000 mL | Freq: Two times a day (BID) | INTRAMUSCULAR | Status: DC
  Administered 2015-08-01 – 2015-08-06 (×9): 3 mL via INTRAVENOUS

## 2015-08-01 MED ORDER — INSULIN DETEMIR 100 UNIT/ML ~~LOC~~ SOLN
12.0000 [IU] | Freq: Every day | SUBCUTANEOUS | Status: DC
  Administered 2015-08-01 – 2015-08-05 (×5): 12 [IU] via SUBCUTANEOUS
  Filled 2015-08-01 (×7): qty 0.12

## 2015-08-01 MED ORDER — LEVOTHYROXINE SODIUM 100 MCG IV SOLR
50.0000 ug | Freq: Every day | INTRAVENOUS | Status: DC
  Administered 2015-08-01: 50 ug via INTRAVENOUS
  Filled 2015-08-01 (×2): qty 5

## 2015-08-01 MED ORDER — APIXABAN 5 MG PO TABS
5.0000 mg | ORAL_TABLET | Freq: Two times a day (BID) | ORAL | Status: DC
  Administered 2015-08-01 – 2015-08-06 (×10): 5 mg via ORAL
  Filled 2015-08-01 (×10): qty 1

## 2015-08-01 MED ORDER — HYDROMORPHONE HCL 1 MG/ML IJ SOLN
1.0000 mg | INTRAMUSCULAR | Status: DC | PRN
  Administered 2015-08-01 – 2015-08-03 (×7): 1 mg via INTRAVENOUS
  Filled 2015-08-01 (×7): qty 1

## 2015-08-01 MED ORDER — ALBUMIN HUMAN 25 % IV SOLN
12.5000 g | Freq: Once | INTRAVENOUS | Status: AC
  Administered 2015-08-01: 12.5 g via INTRAVENOUS
  Filled 2015-08-01: qty 50

## 2015-08-01 MED ORDER — AMIODARONE HCL 200 MG PO TABS: 200.0000 mg | ORAL_TABLET | Freq: Every day | ORAL | Status: DC

## 2015-08-01 MED ORDER — LEVOTHYROXINE SODIUM 50 MCG PO TABS: 50.0000 ug | ORAL_TABLET | Freq: Every day | ORAL | Status: DC

## 2015-08-01 MED ORDER — RIFAXIMIN 550 MG PO TABS
550.0000 mg | ORAL_TABLET | Freq: Two times a day (BID) | ORAL | Status: DC
  Administered 2015-08-01 – 2015-08-06 (×10): 550 mg via ORAL
  Filled 2015-08-01 (×10): qty 1

## 2015-08-01 MED ORDER — SODIUM CHLORIDE 0.9 % IV BOLUS (SEPSIS)
500.0000 mL | Freq: Once | INTRAVENOUS | Status: AC
  Administered 2015-08-01: 500 mL via INTRAVENOUS

## 2015-08-01 NOTE — Progress Notes (Signed)
Called by nursing staff patient hypotensive post paracentesis today  Approximately 2 L ascitic fluid removed Has received 500 cc normal saline bolus for blood pressure in the 60s by the previous hospitalist Blood pressure still remains in the low 80s with bradycardia into the 40s Continue IV fluid replacement will also add albumin treatment 1 given moderate large volume paracentesis with low albumin We'll also check CBC to assess for drop in hemoglobin/hematocrit-I suspect that is unlikely given she is actually bradycardic however did undergo procedure while on blood thinners We'll continue to follow

## 2015-08-01 NOTE — Consult Note (Signed)
[facility] Cardiology  CARDIOLOGY CONSULT NOTE  Patient ID: [name] MRN: [id number] DOB/AGE: 10/05/1955 59 y.o.  Admit date: 07/31/2015 Referring Physician [place] Primary Physician [facility] Primary Cardiologist  Reason for Consultation atrial fibrillation  HPI: 59 year old female referred for management of atrial fibrillation on Eliquis. She is admitted at this time with abdominal pain and ascites.She denies chest pain or shortness of breath. She is not expressing palpitations or heart racing.T he patient was placed on Eliquist for stroke prevention which she was found to be in atrial fibrillation in the setting of intractable nausea involving 03/2015,which has been tolerated well without evidence for bleeding.The patient underwent successful paratenesis today without withholding Eliquis, tolerated well without complication.  Review of systems complete and found to be negative unless listed above     Past Medical History  Diagnosis Date  . Type 1 diabetes ([place])   . Hypothyroid   . Degenerative disk disease   . Stomach ulcer   . Diverticulitis   . Syncope 01/2015  . Anxiety   . GERD (gastroesophageal reflux disease)   . History of hiatal hernia   . Cancer (HCC)     HX OF CANCER OF UTERUS   . TIA (transient ischemic attack) 02/2015  . Hypertension   . Pancreatitis   . PAF (paroxysmal atrial fibrillation) ([place]) 03/2015    a. new onset 03/2015 in setting of intractable N/V; b. on Eliquis 5 mg bid; c. CHADSVASc 4 (DM, TIA x 2, female)  . Intussusception intestine ([place]) 05/2015    Past Surgical History  Procedure Laterality Date  . Hernia repair    . Abdominal hysterectomy    . Cholecystectomy    . Esophagogastroduodenoscopy N/A 04/04/2015    Procedure: ESOPHAGOGASTRODUODENOSCOPY (EGD);  Surgeon: Hulen Luster, MD;  Location: [facility] ENDOSCOPY;  Service: Endoscopy;  Laterality: N/A;    Prescriptions prior to admission  Medication Sig Dispense Refill Last Dose  . amiodarone (PACERONE) 200  MG tablet Take 1 tablet (200 mg total) by mouth daily. 30 tablet 0 Past Week at Unknown time  . apixaban (ELIQUIS) 5 MG TABS tablet Take 1 tablet (5 mg total) by mouth 2 (two) times daily. 60 tablet 0 Past Week at Unknown time  . atorvastatin (LIPITOR) 40 MG tablet Take 1 tablet (40 mg total) by mouth daily at 6 PM. 30 tablet 0 Past Week at Unknown time  . citalopram (CELEXA) 20 MG tablet Take 20 mg by mouth daily.   Past Week at Unknown time  . dicyclomine (BENTYL) 10 MG capsule Take 1 capsule by mouth 4 (four) times daily - after meals and at bedtime.    Past Week at Unknown time  . insulin detemir (LEVEMIR) 100 UNIT/ML injection Inject 0.17 mLs (17 Units total) into the skin daily. 10 mL 11 Past Week at Unknown time  . lisinopril (PRINIVIL,ZESTRIL) 10 MG tablet Take 0.5 tablets (5 mg total) by mouth 2 (two) times daily. 60 tablet 6 Past Week at Unknown time  . metoprolol succinate (TOPROL-XL) 25 MG 24 hr tablet Take 1 tablet (25 mg total) by mouth daily. 30 tablet 0 Past Week at Unknown time  . midodrine (PROAMATINE) 10 MG tablet Take 1 tablet (10 mg total) by mouth 3 (three) times daily with meals. 90 tablet 0 Past Week at Unknown time  . morphine (MS CONTIN) 60 MG 12 hr tablet Take 1 tablet (60 mg total) by mouth 2 (two) times daily. 60 tablet 0 Past Week at Unknown time  . pantoprazole (  PROTONIX) 40 MG tablet Take 1 tablet (40 mg total) by mouth 2 (two) times daily. 60 tablet 0 Past Week at Unknown time  . promethazine (PHENERGAN) 25 MG tablet Take 1 tablet (25 mg total) by mouth every 6 (six) hours as needed for nausea or vomiting. 15 tablet 0 prn at prn  . ranitidine (ZANTAC) 150 MG capsule Take 1 capsule (150 mg total) by mouth 2 (two) times daily. 28 capsule 0 Past Week at Unknown time  . rifaximin (XIFAXAN) 550 MG TABS tablet Take 1 tablet (550 mg total) by mouth 2 (two) times daily. 60 tablet 6 Past Week at Unknown time  . tiZANidine (ZANAFLEX) 4 MG tablet Take 4 mg by mouth every 6 (six)  hours.   Past Week at Unknown time  . zolpidem (AMBIEN) 10 MG tablet Take 10 mg by mouth at bedtime.   Past Week at Unknown time  . psyllium (METAMUCIL) 58.6 % packet Take 1 packet by mouth daily as needed (constipation).    prn at prn   Social History   Social History  . Marital Status: Married    Spouse Name: N/A  . Number of Children: N/A  . Years of Education: N/A   Occupational History  . Disabled 2nd back problems    Social History Main Topics  . Smoking status: Never Smoker   . Smokeless tobacco: Never Used  . Alcohol Use: No  . Drug Use: No  . Sexual Activity: No   Other Topics Concern  . Not on file   Social History Narrative   Lives in [place], [place] with her husband and 2 sons.    Family History  Problem Relation Age of Onset  . Hypertension Mother   . CAD Sister       Review of systems complete and found to be negative unless listed above      PHYSICAL EXAM  General: Well developed, well nourished, in no acute distress HEENT:  Normocephalic and atramatic Neck:  No JVD.  Lungs: Clear bilaterally to auscultation and percussion. Heart: HRRR . Normal S1 and S2 without gallops or murmurs.  Abdomen: Bowel sounds are positive, abdomen soft and non-tender  Msk:  Back normal, normal gait. Normal strength and tone for age. Extremities: No clubbing, cyanosis or edema.   Neuro: Alert and oriented X 3. Psych:  Good affect, responds appropriately  Labs:   Lab Results  Component Value Date   WBC 6.1 07/31/2015   HGB 13.5 07/31/2015   HCT 40.5 07/31/2015   MCV 96.8 07/31/2015   PLT 164 07/31/2015    Recent Labs Lab 08/01/15 1155  NA 137  K 2.8*  CL 106  CO2 24  BUN 10  CREATININE 0.65  CALCIUM 7.6*  PROT 4.5*  BILITOT 1.0  ALKPHOS 75  ALT 25  AST 33  GLUCOSE 139*   Lab Results  Component Value Date   CKTOTAL 46 04/05/2015   CKMB 1.4 10/13/2014   TROPONINI <0.03 06/03/2015    Lab Results  Component Value Date   CHOL 113 05/19/2015    CHOL 136 02/16/2015   CHOL 182 11/12/2011   Lab Results  Component Value Date   HDL 53 05/19/2015   HDL 48 02/16/2015   HDL 23* 11/12/2011   Lab Results  Component Value Date   LDLCALC 51 05/19/2015   LDLCALC 65 02/16/2015   LDLCALC 129* 11/12/2011   Lab Results  Component Value Date   TRIG 44 05/19/2015   TRIG 117 02/16/2015  TRIG 149 11/12/2011   Lab Results  Component Value Date   CHOLHDL 2.1 05/19/2015   CHOLHDL 2.8 02/16/2015   No results found for: LDLDIRECT    Radiology: Dg Elbow 2 Views Right  07/04/2015  CLINICAL DATA:  Elbow pain, no known injury EXAM: RIGHT ELBOW - 2 VIEW COMPARISON:  None. FINDINGS: Two views of the right elbow submitted. No acute fracture or subluxation. No posterior fat pad sign. Exostosis or osteophyte is noted in distal right humerus. IMPRESSION: Negative. Electronically Signed   By: Lahoma Crocker M.D.   On: 07/04/2015 15:00   Dg Elbow Complete Left  07/04/2015  CLINICAL DATA:  Elbow pain, no known injury, diabetes, history of uterine cancer EXAM: LEFT ELBOW - COMPLETE 3+ VIEW COMPARISON:  None. FINDINGS: Four views of left elbow submitted. No acute fracture or subluxation. No posterior fat pad sign. No pathologic calcifications are noted. IMPRESSION: Negative. Electronically Signed   By: Lahoma Crocker M.D.   On: 07/04/2015 14:59   Dg Abd 1 View  07/06/2015  CLINICAL DATA:  Abdominal pain with blood in stool EXAM: ABDOMEN - 1 VIEW COMPARISON:  CT abdomen and pelvis July 03, 2015 FINDINGS: There is moderate stool in the colon. There is no bowel dilatation or air-fluid level suggesting obstruction. No free air is seen on this supine examination. There are phleboliths in the pelvis. There is lumbar levoscoliosis. IMPRESSION: Bowel gas pattern unremarkable.  Moderate stool in colon. Electronically Signed   By: Lowella Grip III M.D.   On: 07/06/2015 15:57   Ct Head Wo Contrast  07/06/2015  CLINICAL DATA:  Altered mental status. EXAM: CT HEAD  WITHOUT CONTRAST TECHNIQUE: Contiguous axial images were obtained from the base of the skull through the vertex without intravenous contrast. COMPARISON:  CT scan of May 27, 2015. FINDINGS: Bony calvarium appears intact. Mild chronic ischemic white matter disease is noted. No mass effect or midline shift is noted. Ventricular size is within normal limits. There is no evidence of mass lesion, hemorrhage or acute infarction. IMPRESSION: Mild chronic ischemic white matter disease. No acute intracranial abnormality seen. Electronically Signed   By: Marijo Conception, M.D.   On: 07/06/2015 09:27   Ct Abdomen Pelvis W Contrast  07/31/2015  CLINICAL DATA:  Abdominal pain, nausea, vomiting, and diarrhea for 4 days. Patient finished treatment 2 days ago for C difficile. EXAM: CT ABDOMEN AND PELVIS WITH CONTRAST TECHNIQUE: Multidetector CT imaging of the abdomen and pelvis was performed using the standard protocol following bolus administration of intravenous contrast. CONTRAST:  148m OMNIPAQUE IOHEXOL 300 MG/ML  SOLN COMPARISON:  07/03/2015 FINDINGS: Small bilateral pleural effusions with infiltration or atelectasis in the lung bases, greater on the right. Prominent diffuse abdominal ascites, increasing since prior study. No free air. Nodular contour of the liver consistent with hepatic cirrhosis. No focal liver lesions. Surgical absence of the gallbladder. No bile duct dilatation. Spleen size is normal. The pancreas, adrenal glands, kidneys, abdominal aorta, inferior vena cava, and retroperitoneal lymph nodes are unremarkable. Filling defect again demonstrated in the cardiac region and along the lesser curvature of the stomach. Stomach is not distended. Mild distention of proximal small bowel with decompressed ileum. Contrast material is shown in the distal small bowel. Consistent with partial obstruction. Transition zone appears to be in the left mid abdomen. No cause for obstruction is demonstrated. No  appreciable wall thickening. Colon is mostly decompressed. Pelvis: Appendix is not identified. Diffuse pelvic ascites. No pelvic mass or lymphadenopathy. Bladder is decompressed.  No destructive bone lesions. IMPRESSION: Small bilateral pleural effusions with infiltration or atelectasis in the lung bases. Changes of hepatic cirrhosis with prominent diffuse abdominal ascites, increasing since previous study. Proximal small bowel distention with transition zone to decompressed bowel in the left mid abdomen. Changes are consistent with partial obstruction. Filling defect in the cardia/lesser curvature of the stomach again demonstrated. Electronically Signed   By: Lucienne Capers M.D.   On: 07/31/2015 23:05   Ct Abdomen Pelvis W Contrast  07/05/2015  ADDENDUM REPORT: 07/05/2015 17:10 ADDENDUM: The original report was by Dr. Madie Reno. The following addendum is by Dr. Van Clines: I received a call requesting comparison to the 04/03/2015 exam with regard to the masslike thickening in the gastroesophageal junction and stomach cardia. The appearance in this area on 07/03/2015 is very similar to the 04/03/2015 exam. By report, the patient had an endoscopy on 04/04/2015 with careful assessment of the stomach cardia and gastroesophageal junction. At the endoscopy, fundoplication was revealed along with gastritis but there was no mass. Given the similarity of the appearance on 07/03/2015 and 04/03/2015, this region has thus already been assessed by endoscopy. Accordingly, further endoscopy is not felt to be mandatory and in fact may be duplicative. Electronically Signed   By: Van Clines M.D.   On: 07/05/2015 17:10  07/05/2015  CLINICAL DATA:  59 year old female with epigastric pain for the past 4 days. Nausea and vomiting. History of uterine cancer. EXAM: CT ABDOMEN AND PELVIS WITH CONTRAST TECHNIQUE: Multidetector CT imaging of the abdomen and pelvis was performed using the standard protocol following  bolus administration of intravenous contrast. CONTRAST:  128m OMNIPAQUE IOHEXOL 300 MG/ML  SOLN COMPARISON:  CT the abdomen and pelvis 05/17/2015. FINDINGS: Lower chest:  Unremarkable. Hepatobiliary: The liver has a shrunken appearance and nodular contour, compatible with underlying cirrhosis. Multiple nodular appearing hypovascular areas on the initial postcontrast images which were obtained slightly early, favored to represent a background of widespread regenerative nodules. No discrete hypervascular hepatic lesion is confidently identified. This unusual pattern in the liver seems to normalize on the more delayed images in the visualized portions of the liver. No intra or extrahepatic biliary ductal dilatation. Status post cholecystectomy. Pancreas: No pancreatic mass. No pancreatic ductal dilatation. No pancreatic or peripancreatic fluid or inflammatory changes. Spleen: Unremarkable. Adrenals/Urinary Tract: Bilateral adrenal glands the left kidney are normal in appearance. Sub cm low-attenuation lesions in the right kidney are too small to definitively characterize, but are statistically likely to represent tiny cysts. 9 mm nonobstructive calculus in the lower pole collecting system of the right kidney. Mild right hydroureter. No calculi are identified along the course of either ureter or within the lumen of the urinary bladder. Urinary bladder is moderately distended. Bilateral adrenal glands are normal in appearance. Stomach/Bowel: Profound thickening of the gastric wall immediately distal to the gastroesophageal junction, somewhat mass-like in appearance, best appreciated on image 17 of series 2 where this measures up to 7.1 x 3.6 cm. No pathologic dilatation of small bowel or colon. Vascular/Lymphatic: Atherosclerotic calcifications throughout the abdominal and pelvic vasculature, without evidence of aneurysm or dissection. No lymphadenopathy noted in the abdomen or pelvis. Reproductive: Status post  hysterectomy. Ovaries are not confidently identified may be surgically absent or atrophic. Other: Small volume of ascites.  No pneumoperitoneum. Musculoskeletal: There are no aggressive appearing lytic or blastic lesions noted in the visualized portions of the skeleton. IMPRESSION: 1. Mass-like thickening of the proximal stomach highly concerning for neoplasm. Correlation with nonemergent endoscopy is recommended  in the near future to exclude neoplasm. 2. Morphologic changes in the liver most compatible with underlying cirrhosis, as discussed above. The possibility of metastatic disease to the liver is not excluded, but is not strongly favored on the basis of today's examination. This could be more definitively evaluated with followup nonemergent MRI of the abdomen with and without IV gadolinium if clinically appropriate. 3. 9 mm nonobstructive calculus in the lower pole collecting system of the right kidney. 4. Mild right hydroureter, without frank hydronephrosis. This is of uncertain etiology and significance, but could indicate mild distal ureteral stricture related to remote passage of a stone. 5. Atherosclerosis. Electronically Signed: By: Vinnie Langton M.D. On: 07/04/2015 00:53   US Paracentesis  08/01/2015  CLINICAL DATA:  Ascites. EXAM: ULTRASOUND GUIDED PARACENTESIS COMPARISON:  CT scan of July 31, 2015. PROCEDURE: An ultrasound guided paracentesis was thoroughly discussed with the patient and questions answered. The benefits, risks, alternatives and complications were also discussed. The patient understands and wishes to proceed with the procedure. Written consent was obtained. Ultrasound was performed to localize and mark an adequate pocket of fluid in the right lower quadrant of the abdomen. The area was then prepped and draped in the normal sterile fashion. 1% Lidocaine was used for local anesthesia. Under ultrasound guidance a Safe-T-Centesis catheter was introduced. Paracentesis was  performed. The catheter was removed and a dressing applied. COMPLICATIONS: None immediate. FINDINGS: A total of approximately 3.25 L of serous fluid was removed. A fluid sample was sent for laboratory analysis. IMPRESSION: Successful ultrasound guided paracentesis yielding 3.25 L of ascites. Electronically Signed   By: Marijo Conception, M.D.   On: 08/01/2015 10:44   Dg Chest Port 1 View  07/06/2015  CLINICAL DATA:  Patient has developing altered mental status, bloody stools and unspecified abdominal pain that began today. EXAM: PORTABLE CHEST 1 VIEW COMPARISON:  06/02/2015 FINDINGS: The heart size and mediastinal contours are within normal limits. Both lungs are clear. The visualized skeletal structures are unremarkable. IMPRESSION: No active disease. Electronically Signed   By: Lajean Manes M.D.   On: 07/06/2015 15:58   Dg Abd Acute W/chest  07/31/2015  CLINICAL DATA:  Entire abdominal pain for 5 days. Diarrhea. No vomiting. Abdominal pain and distention. EXAM: DG ABDOMEN ACUTE W/ 1V CHEST COMPARISON:  Abdomen 07/06/2015.  Chest 07/06/2015. FINDINGS: Vague nodular opacity in the right mid lung measuring 2 cm diameter. This was not identified previously. I believe this represents a patch for an EKG lead. Suggest visual inspection of the patient and repeat imaging with removal of the knee EKG stay occurs. Mild cardiac enlargement with normal pulmonary vascularity. Mediastinal contours appear intact. Shallow inspiration with infiltration or atelectasis in the right lung base and blunting of the right costophrenic angle suggesting a small effusion. Changes may indicate pneumonia. Gas-filled and distended mid abdominal small bowel with air-fluid levels on the upright view suggesting partial obstruction. Gas is demonstrated in the colon and rectum. Colon is not dilated. No free intra-abdominal air. Lumbar scoliosis convex towards the left. Surgical clips in the right upper quadrant. IMPRESSION: Volume loss,  atelectasis, and small effusion in the right lung base likely representing pneumonia. Indeterminate 2 cm right mid lung nodular opacity. This probably represents an EKG lead. Distended gas-filled mid abdominal small bowel with air-fluid levels suggesting partial obstruction. Electronically Signed   By: Lucienne Capers M.D.   On: 07/31/2015 20:39    EKG: normal sinus rhythm  ASSESSMENT AND PLAN:   Paroxysmal atrial fibrillation,chads  Vascor 4, currently on Eliquis for stroke prevention which is tolerated well.  Recommendations  1. Continue current medications 2. Continue Eliquis for stroke prevention 3. Defer further cardiac diagnostics at this time  Signed: Camella Seim MD,PhD, [facility] 08/01/2015, 6:13 PM

## 2015-08-01 NOTE — H&P (Signed)
[name] is an 59 y.o. female.   Chief Complaint: Abdominal pain HPI: The patient presents to the emergency department complaining of abdominal pain. It has progressively worsened over the last day and a half. Her home health nurse examined her today and encouraged her to go to the hospital for evaluation. Notably the patient has also had some edema of her lower extremities for at least a week or more. She states that at one point she developed blisters that began to weep although these are healing at this time. The edema in her lower extremities has also improved but her abdomen has swollen tremendously and is tender to touch. The patient denies fevers but admits to nausea with 1 episode of nonbilious non-bloody emesis. She has also had diarrhea despite completing treatment for Clostridium difficile a few days ago. In the emergency department she was found to have significant ascites and the appearance of possible small bowel obstruction. She was evaluated by surgical service and found not to require surgical intervention. Past medical history is significant for uterine cancer status post chemotherapy and hysterectomy as well as cirrhosis of the liver. The patient reports that the latter is a new diagnosis as of today. Due to her rapid onset ascites and abdominal pain the emergency department staff called for admission.  Past Medical History  Diagnosis Date  . Type 1 diabetes ([place])   . Hypothyroid   . Degenerative disk disease   . Stomach ulcer   . Diverticulitis   . Syncope 01/2015  . Anxiety   . GERD (gastroesophageal reflux disease)   . History of hiatal hernia   . Cancer (HCC)     HX OF CANCER OF UTERUS   . TIA (transient ischemic attack) 02/2015  . Hypertension   . Pancreatitis   . PAF (paroxysmal atrial fibrillation) ([place]) 03/2015    a. new onset 03/2015 in setting of intractable N/V; b. on Eliquis 5 mg bid; c. CHADSVASc 4 (DM, TIA x 2, female)  . Intussusception intestine ([place])  05/2015    Past Surgical History  Procedure Laterality Date  . Hernia repair    . Abdominal hysterectomy    . Cholecystectomy    . Esophagogastroduodenoscopy N/A 04/04/2015    Procedure: ESOPHAGOGASTRODUODENOSCOPY (EGD);  Surgeon: Hulen Luster, MD;  Location: [facility] ENDOSCOPY;  Service: Endoscopy;  Laterality: N/A;    Family History  Problem Relation Age of Onset  . Hypertension Mother   . CAD Sister    Social History:  reports that she has never smoked. She has never used smokeless tobacco. She reports that she does not drink alcohol or use illicit drugs.  Allergies:  Allergies  Allergen Reactions  . Erythromycin Base Other (See Comments)    Reaction:  Fever   . Rosiglitazone Maleate Swelling  . Codeine Sulfate Rash  . Tetanus-Diphtheria Toxoids Td Rash and Other (See Comments)    Reaction:  Fever     Prior to Admission medications   Medication Sig Start Date End Date Taking? Authorizing Provider  amiodarone (PACERONE) 200 MG tablet Take 1 tablet (200 mg total) by mouth daily. 05/31/15  Yes Geradine Girt, DO  apixaban (ELIQUIS) 5 MG TABS tablet Take 1 tablet (5 mg total) by mouth 2 (two) times daily. 04/07/15  Yes Bettey Costa, MD  atorvastatin (LIPITOR) 40 MG tablet Take 1 tablet (40 mg total) by mouth daily at 6 PM. 02/17/15  Yes Sital Mody, MD  citalopram (CELEXA) 20 MG tablet Take 20 mg by  mouth daily.   Yes Historical Provider, MD  dicyclomine (BENTYL) 10 MG capsule Take 1 capsule by mouth 4 (four) times daily - after meals and at bedtime.  03/11/15  Yes Historical Provider, MD  insulin detemir (LEVEMIR) 100 UNIT/ML injection Inject 0.17 mLs (17 Units total) into the skin daily. 07/11/15  Yes Theodoro Grist, MD  lisinopril (PRINIVIL,ZESTRIL) 10 MG tablet Take 0.5 tablets (5 mg total) by mouth 2 (two) times daily. 07/11/15  Yes Theodoro Grist, MD  metoprolol succinate (TOPROL-XL) 25 MG 24 hr tablet Take 1 tablet (25 mg total) by mouth daily. 06/14/15  Yes [name], NP  midodrine  (PROAMATINE) 10 MG tablet Take 1 tablet (10 mg total) by mouth 3 (three) times daily with meals. 05/31/15  Yes Geradine Girt, DO  morphine (MS CONTIN) 60 MG 12 hr tablet Take 1 tablet (60 mg total) by mouth 2 (two) times daily. 07/11/15  Yes Theodoro Grist, MD  pantoprazole (PROTONIX) 40 MG tablet Take 1 tablet (40 mg total) by mouth 2 (two) times daily. 04/07/15  Yes Bettey Costa, MD  promethazine (PHENERGAN) 25 MG tablet Take 1 tablet (25 mg total) by mouth every 6 (six) hours as needed for nausea or vomiting. 07/03/15  Yes Carrie Mew, MD  ranitidine (ZANTAC) 150 MG capsule Take 1 capsule (150 mg total) by mouth 2 (two) times daily. 07/03/15  Yes Carrie Mew, MD  rifaximin (XIFAXAN) 550 MG TABS tablet Take 1 tablet (550 mg total) by mouth 2 (two) times daily. 07/11/15  Yes Theodoro Grist, MD  tiZANidine (ZANAFLEX) 4 MG tablet Take 4 mg by mouth every 6 (six) hours.   Yes Historical Provider, MD  zolpidem (AMBIEN) 10 MG tablet Take 10 mg by mouth at bedtime.   Yes Historical Provider, MD  psyllium (METAMUCIL) 58.6 % packet Take 1 packet by mouth daily as needed (constipation).     Historical Provider, MD     Results for orders placed or performed during the hospital encounter of 07/31/15 (from the past 48 hour(s))  CBC with Differential     Status: Abnormal   Collection Time: 07/31/15  6:40 PM  Result Value Ref Range   WBC 6.1 3.6 - 11.0 K/uL   RBC 4.19 3.80 - 5.20 MIL/uL   Hemoglobin 13.5 12.0 - 16.0 g/dL   HCT 40.5 35.0 - 47.0 %   MCV 96.8 80.0 - 100.0 fL   MCH 32.2 26.0 - 34.0 pg   MCHC 33.2 32.0 - 36.0 g/dL   RDW 16.6 (H) 11.5 - 14.5 %   Platelets 164 150 - 440 K/uL   Neutrophils Relative % 76 %   Neutro Abs 4.6 1.4 - 6.5 K/uL   Lymphocytes Relative 18 %   Lymphs Abs 1.1 1.0 - 3.6 K/uL   Monocytes Relative 6 %   Monocytes Absolute 0.4 0.2 - 0.9 K/uL   Eosinophils Relative 0 %   Eosinophils Absolute 0.0 0 - 0.7 K/uL   Basophils Relative 0 %   Basophils Absolute 0.0 0 - 0.1  K/uL  Comprehensive metabolic panel     Status: Abnormal   Collection Time: 07/31/15  6:40 PM  Result Value Ref Range   Sodium 138 135 - 145 mmol/L   Potassium 2.4 (LL) 3.5 - 5.1 mmol/L    Comment: CRITICAL RESULT CALLED TO, READ BACK BY AND VERIFIED WITH LUIS FLORES @ 1923 ON 07/31/2015 BY CAF    Chloride 103 101 - 111 mmol/L   CO2 25 22 - 32  mmol/L   Glucose, Bld 182 (H) 65 - 99 mg/dL   BUN 12 6 - 20 mg/dL   Creatinine, Ser 0.82 0.44 - 1.00 mg/dL   Calcium 8.7 (L) 8.9 - 10.3 mg/dL   Total Protein 6.8 6.5 - 8.1 g/dL   Albumin 3.7 3.5 - 5.0 g/dL   AST 41 15 - 41 U/L   ALT 37 14 - 54 U/L   Alkaline Phosphatase 106 38 - 126 U/L   Total Bilirubin 1.5 (H) 0.3 - 1.2 mg/dL   GFR calc non Af Amer >60 >60 mL/min   GFR calc Af Amer >60 >60 mL/min    Comment: (NOTE) The eGFR has been calculated using the CKD EPI equation. This calculation has not been validated in all clinical situations. eGFR's persistently <60 mL/min signify possible Chronic Kidney Disease.    Anion gap 10 5 - 15  Lipase, blood     Status: None   Collection Time: 07/31/15  6:40 PM  Result Value Ref Range   Lipase 46 11 - 51 U/L  Urinalysis complete, with microscopic ([facility] only)     Status: Abnormal   Collection Time: 07/31/15 11:06 PM  Result Value Ref Range   Color, Urine YELLOW (A) YELLOW   APPearance HAZY (A) CLEAR   Glucose, UA 50 (A) NEGATIVE mg/dL   Bilirubin Urine NEGATIVE NEGATIVE   Ketones, ur TRACE (A) NEGATIVE mg/dL   Specific Gravity, Urine 1.031 (H) 1.005 - 1.030   Hgb urine dipstick 1+ (A) NEGATIVE   pH 7.0 5.0 - 8.0   Protein, ur 30 (A) NEGATIVE mg/dL   Nitrite NEGATIVE NEGATIVE   Leukocytes, UA TRACE (A) NEGATIVE   RBC / HPF 6-30 0 - 5 RBC/hpf   WBC, UA 0-5 0 - 5 WBC/hpf   Bacteria, UA RARE (A) NONE SEEN   Squamous Epithelial / LPF 6-30 (A) NONE SEEN   Mucous PRESENT    Amorphous Crystal PRESENT    Ct Abdomen Pelvis W Contrast  07/31/2015  CLINICAL DATA:  Abdominal pain, nausea,  vomiting, and diarrhea for 4 days. Patient finished treatment 2 days ago for C difficile. EXAM: CT ABDOMEN AND PELVIS WITH CONTRAST TECHNIQUE: Multidetector CT imaging of the abdomen and pelvis was performed using the standard protocol following bolus administration of intravenous contrast. CONTRAST:  129m OMNIPAQUE IOHEXOL 300 MG/ML  SOLN COMPARISON:  07/03/2015 FINDINGS: Small bilateral pleural effusions with infiltration or atelectasis in the lung bases, greater on the right. Prominent diffuse abdominal ascites, increasing since prior study. No free air. Nodular contour of the liver consistent with hepatic cirrhosis. No focal liver lesions. Surgical absence of the gallbladder. No bile duct dilatation. Spleen size is normal. The pancreas, adrenal glands, kidneys, abdominal aorta, inferior vena cava, and retroperitoneal lymph nodes are unremarkable. Filling defect again demonstrated in the cardiac region and along the lesser curvature of the stomach. Stomach is not distended. Mild distention of proximal small bowel with decompressed ileum. Contrast material is shown in the distal small bowel. Consistent with partial obstruction. Transition zone appears to be in the left mid abdomen. No cause for obstruction is demonstrated. No appreciable wall thickening. Colon is mostly decompressed. Pelvis: Appendix is not identified. Diffuse pelvic ascites. No pelvic mass or lymphadenopathy. Bladder is decompressed. No destructive bone lesions. IMPRESSION: Small bilateral pleural effusions with infiltration or atelectasis in the lung bases. Changes of hepatic cirrhosis with prominent diffuse abdominal ascites, increasing since previous study. Proximal small bowel distention with transition zone to decompressed bowel in the  left mid abdomen. Changes are consistent with partial obstruction. Filling defect in the cardia/lesser curvature of the stomach again demonstrated. Electronically Signed   By: Lucienne Capers M.D.   On:  07/31/2015 23:05   Dg Abd Acute W/chest  07/31/2015  CLINICAL DATA:  Entire abdominal pain for 5 days. Diarrhea. No vomiting. Abdominal pain and distention. EXAM: DG ABDOMEN ACUTE W/ 1V CHEST COMPARISON:  Abdomen 07/06/2015.  Chest 07/06/2015. FINDINGS: Vague nodular opacity in the right mid lung measuring 2 cm diameter. This was not identified previously. I believe this represents a patch for an EKG lead. Suggest visual inspection of the patient and repeat imaging with removal of the knee EKG stay occurs. Mild cardiac enlargement with normal pulmonary vascularity. Mediastinal contours appear intact. Shallow inspiration with infiltration or atelectasis in the right lung base and blunting of the right costophrenic angle suggesting a small effusion. Changes may indicate pneumonia. Gas-filled and distended mid abdominal small bowel with air-fluid levels on the upright view suggesting partial obstruction. Gas is demonstrated in the colon and rectum. Colon is not dilated. No free intra-abdominal air. Lumbar scoliosis convex towards the left. Surgical clips in the right upper quadrant. IMPRESSION: Volume loss, atelectasis, and small effusion in the right lung base likely representing pneumonia. Indeterminate 2 cm right mid lung nodular opacity. This probably represents an EKG lead. Distended gas-filled mid abdominal small bowel with air-fluid levels suggesting partial obstruction. Electronically Signed   By: Lucienne Capers M.D.   On: 07/31/2015 20:39    Review of Systems  Constitutional: Negative for fever and chills.  HENT: Negative for sore throat and tinnitus.   Eyes: Negative for blurred vision and redness.  Respiratory: Negative for cough and shortness of breath.   Cardiovascular: Negative for chest pain, palpitations, orthopnea and PND.  Gastrointestinal: Positive for nausea, abdominal pain and diarrhea. Negative for vomiting, blood in stool and melena.  Genitourinary: Negative for dysuria, urgency  and frequency.  Musculoskeletal: Negative for myalgias and joint pain.  Skin: Negative for rash.       No lesions  Neurological: Negative for speech change, focal weakness and weakness.  Endo/Heme/Allergies: Does not bruise/bleed easily.       No temperature intolerance  Psychiatric/Behavioral: Negative for depression and suicidal ideas.    Blood pressure 166/94, pulse 88, temperature 98 F (36.7 C), temperature source Oral, resp. rate 17, height 5' 4"  (1.626 m), weight 65.772 kg (145 lb), SpO2 94 %. Physical Exam  Vitals reviewed. Constitutional: She is oriented to person, place, and time. She appears well-developed and well-nourished. No distress.  HENT:  Head: Normocephalic and atraumatic.  Mouth/Throat: Oropharynx is clear and moist.  Eyes: Conjunctivae and EOM are normal. Pupils are equal, round, and reactive to light. No scleral icterus.  Neck: Normal range of motion. Neck supple. No JVD present. No tracheal deviation present. No thyromegaly present.  Cardiovascular: Normal rate, regular rhythm and normal heart sounds.  Exam reveals no gallop and no friction rub.   No murmur heard. Respiratory: Effort normal and breath sounds normal.  GI: Soft. Bowel sounds are normal. She exhibits no distension. There is no tenderness.  Genitourinary:  Deferred  Musculoskeletal: Normal range of motion. She exhibits edema.  Lymphadenopathy:    She has no cervical adenopathy.  Neurological: She is alert and oriented to person, place, and time. No cranial nerve deficit. She exhibits normal muscle tone.  Skin: Skin is warm and dry. No rash noted. No erythema.  Psychiatric: She has a normal mood and  affect. Her behavior is normal. Judgment and thought content normal.     Assessment/Plan This is a 59 year old Caucasian female admitted for abdominal pain secondary to ascites. 1. Abdominal pain: No indication of SBP. He was to manage pain. Therapeutic and diagnostic paracentesis ordered. May need  fresh frozen plasma to offset Eliquis. Discussed with radiology in the morning. 2. Ascites: Etiology could be cirrhosis or return of urine cancer. Culture, Gram stain, cell count, LDH and albumin ordered. Continue rifaximin for SBP prophylaxis. (Also indicates that ascites is not new as initially reported by patient). 3. A. fib: Rate controlled; continue amiodarone and Eliquis for now. 4. Essential hypertension: Continue lisinopril and metoprolol 5. Diabetes mellitus type 2: Continue basal insulin. Sinuses insulin while hospitalized. 6. Hyperlipidemia: Continue statin 7. Depression: Continue Celexa 8. DVT prophylaxis: SCDs in addition to Eliquis 9. GI prophylaxis: Pantoprazole The patient is a full code. Time spent on admission was inpatient care approximately 45 minutes  Harrie Foreman 08/01/2015, 1:12 AM

## 2015-08-01 NOTE — Consult Note (Signed)
Patient ID: [name], female   DOB: [birth year], 59 y.o.   MRN: [id number]  CC: ABDOMINAL PAIN  HPI [name] is a 59 y.o. female presents to emergency department today for worsening abdominal pain, distention, diarrhea. Patient states she also had 1 episode of nausea vomiting however has not had any since reporting to the emergency room. Patient was recently hospitalized for diverticulitis complicated by C. difficile. She states she just completed antibiotics approximately 2 days ago. Her abdominal distention started prior to completing the antibiotics. She denies any fevers, chills, chest pain, shortness of breath. States she's had abdominal pain, distention, diarrhea, nausea, vomiting.  HPI  Past Medical History  Diagnosis Date  . Type 1 diabetes ([place])   . Hypothyroid   . Degenerative disk disease   . Stomach ulcer   . Diverticulitis   . Syncope 01/2015  . Anxiety   . GERD (gastroesophageal reflux disease)   . History of hiatal hernia   . Cancer (HCC)     HX OF CANCER OF UTERUS   . TIA (transient ischemic attack) 02/2015  . Hypertension   . Pancreatitis   . PAF (paroxysmal atrial fibrillation) ([place]) 03/2015    a. new onset 03/2015 in setting of intractable N/V; b. on Eliquis 5 mg bid; c. CHADSVASc 4 (DM, TIA x 2, female)  . Intussusception intestine ([place]) 05/2015    Past Surgical History  Procedure Laterality Date  . Hernia repair    . Abdominal hysterectomy    . Cholecystectomy    . Esophagogastroduodenoscopy N/A 04/04/2015    Procedure: ESOPHAGOGASTRODUODENOSCOPY (EGD);  Surgeon: Hulen Luster, MD;  Location[facility] ENDOSCOPY;  Service: Endoscopy;  Laterality: N/A;    Family History  Problem Relation Age of Onset  . Hypertension Mother   . CAD Sister     Social History Social History  Substance Use Topics  . Smoking status: Never Smoker   . Smokeless tobacco: Never Used  . Alcohol Use: No    Allergies  Allergen Reactions  . Erythromycin Base Other (See  Comments)    Reaction:  Fever   . Rosiglitazone Maleate Swelling  . Codeine Sulfate Rash  . Tetanus-Diphtheria Toxoids Td Rash and Other (See Comments)    Reaction:  Fever     No current facility-administered medications for this encounter.   Current Outpatient Prescriptions  Medication Sig Dispense Refill  . amiodarone (PACERONE) 200 MG tablet Take 1 tablet (200 mg total) by mouth daily. 30 tablet 0  . apixaban (ELIQUIS) 5 MG TABS tablet Take 1 tablet (5 mg total) by mouth 2 (two) times daily. 60 tablet 0  . atorvastatin (LIPITOR) 40 MG tablet Take 1 tablet (40 mg total) by mouth daily at 6 PM. 30 tablet 0  . citalopram (CELEXA) 20 MG tablet Take 20 mg by mouth daily.    Marland Kitchen dicyclomine (BENTYL) 10 MG capsule Take 1 capsule by mouth 4 (four) times daily - after meals and at bedtime.     . feeding supplement (BOOST / RESOURCE BREEZE) LIQD Take 1 Container by mouth 3 (three) times daily with meals. 90 Container 6  . insulin aspart (NOVOLOG) 100 UNIT/ML FlexPen Inject 0-15 mL into the skin 3 x day with meals, according to sliding scale 15 mL 11  . insulin detemir (LEVEMIR) 100 UNIT/ML injection Inject 0.17 mLs (17 Units total) into the skin daily. 10 mL 11  . lisinopril (PRINIVIL,ZESTRIL) 10 MG tablet Take 0.5 tablets (5 mg total) by mouth 2 (two)  times daily. 60 tablet 6  . metoprolol succinate (TOPROL-XL) 25 MG 24 hr tablet Take 1 tablet (25 mg total) by mouth daily. 30 tablet 0  . metroNIDAZOLE (FLAGYL) 500 MG tablet Take 1 tablet (500 mg total) by mouth every 8 (eight) hours. 42 tablet 0  . midodrine (PROAMATINE) 10 MG tablet Take 1 tablet (10 mg total) by mouth 3 (three) times daily with meals. 90 tablet 0  . morphine (MS CONTIN) 60 MG 12 hr tablet Take 1 tablet (60 mg total) by mouth 2 (two) times daily. 60 tablet 0  . pantoprazole (PROTONIX) 40 MG tablet Take 1 tablet (40 mg total) by mouth 2 (two) times daily. (Patient not taking: Reported on 07/05/2015) 60 tablet 0  . promethazine  (PHENERGAN) 25 MG tablet Take 25 mg by mouth every 6 (six) hours as needed for nausea or vomiting.    . promethazine (PHENERGAN) 25 MG tablet Take 1 tablet (25 mg total) by mouth every 6 (six) hours as needed for nausea or vomiting. 15 tablet 0  . psyllium (METAMUCIL) 58.6 % packet Take 1 packet by mouth every other day.    . ranitidine (ZANTAC) 150 MG capsule Take 1 capsule (150 mg total) by mouth 2 (two) times daily. 28 capsule 0  . rifaximin (XIFAXAN) 550 MG TABS tablet Take 1 tablet (550 mg total) by mouth 2 (two) times daily. 60 tablet 6  . sucralfate (CARAFATE) 1 G tablet Take 1 tablet (1 g total) by mouth 4 (four) times daily. 120 tablet 1  . tiZANidine (ZANAFLEX) 4 MG tablet Take 4 mg by mouth every 6 (six) hours.       Review of Systems A multi-point review of systems was asked and was negative except for the positive findings listed in the history of present illness  Physical Exam Blood pressure 152/88, pulse 89, temperature 98 F (36.7 C), temperature source Oral, resp. rate 17, height 5' 4"  (1.626 m), weight 65.772 kg (145 lb), SpO2 98 %. CONSTITUTIONAL: Resting in bed in no acute distress. EYES: Pupils are equal, round, and reactive to light, Sclera are non-icteric. EARS, NOSE, MOUTH AND THROAT: The oropharynx is clear. The oral mucosa is pink and moist. Hearing is intact to voice. LYMPH NODES:  Lymph nodes in the neck are normal. RESPIRATORY:  Lungs are clear. There is normal respiratory effort, with equal breath sounds bilaterally, and without pathologic use of accessory muscles. CARDIOVASCULAR: Heart is regular without murmurs, gallops, or rubs. GI: The abdomen is visibly distended, positive fluid wave, tender to palpation in all quadrants secondary to distention. There are no palpable masses. . There are audible bowel sounds in all quadrants. GU: Rectal deferred.   MUSCULOSKELETAL: Normal muscle strength and tone. No cyanosis or edema.   SKIN: Turgor is good and there are no  pathologic skin lesions or ulcers. NEUROLOGIC: Motor and sensation is grossly normal. Cranial nerves are grossly intact. PSYCH:  Oriented to person, place and time. Affect is normal.  Data Reviewed Images and labs reviewed. CT scan interpreter radiologist as possible small bowel obstruction, however there is definitely new large volume ascites throughout the entirety of the abdomen. There is no evidence perforation or transition point that would be indicative of bowel obstruction. There is a gradual transition from dilated to decompressed bowel that is floating in the middle of the ascites. Labs concerning for hypokalemia and hyperbilirubinemia. I have personally reviewed the patient's imaging, laboratory findings and medical records.    Assessment    59 year old  female with abdominal pain likely secondary to new large volume ascites    Plan    Per history, images, exam patient likely with symptomatic large volume ascites. No evidence of bowel obstruction in this patient who is having bowel movements and tolerated her by mouth contrast for her contrasted CT scan without any nausea, vomiting. No current evidence of any required surgical intervention. Recommend medical evaluation and treatment for new onset large volume ascites. This could either represent new onset liver failure or possible recurrence of her GYN cancer. Please call the on-call surgeon again should surgery input be of use.     Time spent with the patient was 30 minutes, with more than 50% of the time spent in face-to-face education, counseling and care coordination.     Clayburn Pert 08/01/2015, 12:07 AM

## 2015-08-01 NOTE — Procedures (Signed)
Under US guidance, paracentesis was performed. Sample sent to lab.

## 2015-08-01 NOTE — Consult Note (Addendum)
Endocrine Initial Consult Note Date of Consult: 08/01/2015  Consulting Service: [facility] Endocrinology  MD Requesting Consult: Alveta Heimlich Sudini  SUBJECTIVE: Reason for Consultation: hypothyroidism on amiodarone  History of Present Illness: [name] is a 59 y.o. female with PMH A. Fib on amiodarone, Hypothyroidism, Type 2 DM, Liver Cirrhosis complicated by ascites, and recent C. Diff Colitis admitted with abdominal pain and swelling. She was recently diagnosed with liver cirrhosis and has undergone therapeutic paracentesis as of this morning. Endocrinology has been consulted regarding hypothyroidism. TSH was 61. She reports taking levothyroxine for about 6 months. Then it was discontinued during a recent hospitalization. She was previously on levothyroxine 25 mcg daily. She believes she has been on Amiodarone for A. Fib since July 2016.  Symptomatically, she endorses fatigue, excessive hair loss x 6 months, excessively dry skin x 2 months, lower extremity edema, mental fogginess and difficulty concentrating. More recently, she has had abdominal swelling, early satiety, poor appetite, nausea, and an episode of vomiting. She also has diarrhea that has returned after recent treatment for C. Diff colitis.   ROS: As in HPI. Otherwise 10 pt ROS was negative.   Past Medical History  Diagnosis Date  . Type 1 diabetes ([place])   . Hypothyroid   . Degenerative disk disease   . Stomach ulcer   . Diverticulitis   . Syncope 01/2015  . Anxiety   . GERD (gastroesophageal reflux disease)   . History of hiatal hernia   . Cancer (HCC)     HX OF CANCER OF UTERUS   . TIA (transient ischemic attack) 02/2015  . Hypertension   . Pancreatitis   . PAF (paroxysmal atrial fibrillation) ([place]) 03/2015    a. new onset 03/2015 in setting of intractable N/V; b. on Eliquis 5 mg bid; c. CHADSVASc 4 (DM, TIA x 2, female)  . Intussusception intestine ([place]) 05/2015   Past Surgical History  Procedure Laterality  Date  . Hernia repair    . Abdominal hysterectomy    . Cholecystectomy    . Esophagogastroduodenoscopy N/A 04/04/2015    Procedure: ESOPHAGOGASTRODUODENOSCOPY (EGD);  Surgeon: Hulen Luster, MD;  Location: [facility] ENDOSCOPY;  Service: Endoscopy;  Laterality: N/A;   Family History  Problem Relation Age of Onset  . Hypertension Mother   . CAD Sister     Social History:  Social History  Substance Use Topics  . Smoking status: Never Smoker   . Smokeless tobacco: Never Used  . Alcohol Use: No    Allergies  Allergen Reactions  . Erythromycin Base Other (See Comments)    Reaction:  Fever   . Rosiglitazone Maleate Swelling  . Codeine Sulfate Rash  . Tetanus-Diphtheria Toxoids Td Rash and Other (See Comments)    Reaction:  Fever      Medications:  No current facility-administered medications on file prior to encounter.   Current Outpatient Prescriptions on File Prior to Encounter  Medication Sig Dispense Refill  . amiodarone (PACERONE) 200 MG tablet Take 1 tablet (200 mg total) by mouth daily. 30 tablet 0  . apixaban (ELIQUIS) 5 MG TABS tablet Take 1 tablet (5 mg total) by mouth 2 (two) times daily. 60 tablet 0  . atorvastatin (LIPITOR) 40 MG tablet Take 1 tablet (40 mg total) by mouth daily at 6 PM. 30 tablet 0  . citalopram (CELEXA) 20 MG tablet Take 20 mg by mouth daily.    Marland Kitchen dicyclomine (BENTYL) 10 MG capsule Take 1 capsule by mouth 4 (four) times daily -  after meals and at bedtime.     . insulin detemir (LEVEMIR) 100 UNIT/ML injection Inject 0.17 mLs (17 Units total) into the skin daily. 10 mL 11  . lisinopril (PRINIVIL,ZESTRIL) 10 MG tablet Take 0.5 tablets (5 mg total) by mouth 2 (two) times daily. 60 tablet 6  . metoprolol succinate (TOPROL-XL) 25 MG 24 hr tablet Take 1 tablet (25 mg total) by mouth daily. 30 tablet 0  . midodrine (PROAMATINE) 10 MG tablet Take 1 tablet (10 mg total) by mouth 3 (three) times daily with meals. 90 tablet 0  . morphine (MS CONTIN) 60 MG 12 hr tablet  Take 1 tablet (60 mg total) by mouth 2 (two) times daily. 60 tablet 0  . pantoprazole (PROTONIX) 40 MG tablet Take 1 tablet (40 mg total) by mouth 2 (two) times daily. 60 tablet 0  . promethazine (PHENERGAN) 25 MG tablet Take 1 tablet (25 mg total) by mouth every 6 (six) hours as needed for nausea or vomiting. 15 tablet 0  . ranitidine (ZANTAC) 150 MG capsule Take 1 capsule (150 mg total) by mouth 2 (two) times daily. 28 capsule 0  . rifaximin (XIFAXAN) 550 MG TABS tablet Take 1 tablet (550 mg total) by mouth 2 (two) times daily. 60 tablet 6  . tiZANidine (ZANAFLEX) 4 MG tablet Take 4 mg by mouth every 6 (six) hours.    . psyllium (METAMUCIL) 58.6 % packet Take 1 packet by mouth daily as needed (constipation).       OBJECTIVE: Temp:  [98 F (36.7 C)-98.3 F (36.8 C)] 98.3 F (36.8 C) (11/21 1013) Pulse Rate:  [78-100] 78 (11/21 1013) Resp:  [14-26] 18 (11/21 1013) BP: (145-169)/(74-101) 157/74 mmHg (11/21 1013) SpO2:  [94 %-100 %] 98 % (11/21 1013) Weight:  [65.772 kg (145 lb)-66.996 kg (147 lb 11.2 oz)] 66.996 kg (147 lb 11.2 oz) (11/21 0122)  Temp (24hrs), Avg:98.2 F (36.8 C), Min:98 F (36.7 C), Max:98.3 F (36.8 C)  Weight: 66.996 kg (147 lb 11.2 oz)  Physical Exam: Gen: no acute distress, well-nourished, well-appearing Neuro: alert and oriented x 3, answering all questions appropriately, grossly non focal, 2+ patellar DTRs  HEENT: [place]/AT, eyes anicteric, EOMI, mucous membranes dry, no oropharyngeal lesions Neck: no thyroid enlargement or nodules noted, no cervical lymphadenopathy CAD: distant heart sounds, irregularly irregular  PULM: clear to ausculation anteriorly. GI: soft, distended, non tender. EXT: no clubbing, cyanosis or edema Skin: extremely dry especially over the forehead area, peeling. Otherwise, warm, dry. There are several healing lesions on the lower extremities where blisters once were.   Labs: Component     Latest Ref Rng 01/12/2015 03/11/2015 04/05/2015  05/18/2015 05/27/2015  TSH     0.350 - 4.500 uIU/mL 4.460 5.108 (H) 1.581 5.073 (H) 4.609 (H)   Component     Latest Ref Rng 08/01/2015  TSH     0.350 - 4.500 uIU/mL 61.012 (H)    ASSESSMENT:  59 yo woman with profound hypothyroidism after discontinuation of levothyroxine. There is no evidence of myxedema coma. Most likely she has underlying autoimmune thyroid disease and resulting thyroid dysfunction that has been exacerbated by amiodarone use.   RECOMMENDATIONS:   Start IV levothyroxine 50 mcg once daily while NPO. Please switch to oral levothyroxine 75 mcg daily once nausea improves and pt is able to take PO. Discharge home on levothyroxine 75 mcg once daily. She will follow up with me 4 weeks post-discharge. She may resume amiodarone therapy if indicated. Thyroid dysfunction is not a contraindication.  However, she will require close monitoring of her thyroid function while on amiodarone. Thank you for allowing me to participate in this patient's care.  Atha Starks, MD [facility] Endocrinology

## 2015-08-01 NOTE — Consult Note (Signed)
[facility]  [address] [place], [place] [address] Phone: [phone] Fax : [phone]  Consultation  Referring Provider:     No ref. provider found Primary Care Physician:  Sheral Flow, NP Primary Gastroenterologist:  Dr. Candace Cruise         Reason for Consultation:     Worsening cirrhosis  Date of Admission:  07/31/2015 Date of Consultation:  08/01/2015         HPI:   [name] is a 60 y.o. female who has a history of a CT scan back in July showed cirrhosis. The patient also had an upper endoscopy at that time. The patient has a history of C. difficile colitis back in October of this year. She now comes in with severe abdominal pain and ascites. The patient had the ascites tapped which showed the patient to have no infection. The patient had a workup with blood work looking for the cause of her cirrhosis at her last admission without anything being positive. There is no report of any nausea or vomiting. The patient states that her abdominal pain started yesterday. She states that her abdominal distention is much less but she continues to have abdominal pain. She also reports that her diarrhea has completely stopped but was yellow prior to stopping.  Past Medical History  Diagnosis Date  . Type 1 diabetes ([place])   . Hypothyroid   . Degenerative disk disease   . Stomach ulcer   . Diverticulitis   . Syncope 01/2015  . Anxiety   . GERD (gastroesophageal reflux disease)   . History of hiatal hernia   . Cancer (HCC)     HX OF CANCER OF UTERUS   . TIA (transient ischemic attack) 02/2015  . Hypertension   . Pancreatitis   . PAF (paroxysmal atrial fibrillation) ([place]) 03/2015    a. new onset 03/2015 in setting of intractable N/V; b. on Eliquis 5 mg bid; c. CHADSVASc 4 (DM, TIA x 2, female)  . Intussusception intestine ([place]) 05/2015    Past Surgical History  Procedure Laterality Date  . Hernia repair    . Abdominal hysterectomy    . Cholecystectomy      . Esophagogastroduodenoscopy N/A 04/04/2015    Procedure: ESOPHAGOGASTRODUODENOSCOPY (EGD);  Surgeon: Hulen Luster, MD;  Location: [facility] ENDOSCOPY;  Service: Endoscopy;  Laterality: N/A;    Prior to Admission medications   Medication Sig Start Date End Date Taking? Authorizing Provider  amiodarone (PACERONE) 200 MG tablet Take 1 tablet (200 mg total) by mouth daily. 05/31/15  Yes Geradine Girt, DO  apixaban (ELIQUIS) 5 MG TABS tablet Take 1 tablet (5 mg total) by mouth 2 (two) times daily. 04/07/15  Yes Bettey Costa, MD  atorvastatin (LIPITOR) 40 MG tablet Take 1 tablet (40 mg total) by mouth daily at 6 PM. 02/17/15  Yes Sital Mody, MD  citalopram (CELEXA) 20 MG tablet Take 20 mg by mouth daily.   Yes Historical Provider, MD  dicyclomine (BENTYL) 10 MG capsule Take 1 capsule by mouth 4 (four) times daily - after meals and at bedtime.  03/11/15  Yes Historical Provider, MD  insulin detemir (LEVEMIR) 100 UNIT/ML injection Inject 0.17 mLs (17 Units total) into the skin daily. 07/11/15  Yes Theodoro Grist, MD  lisinopril (PRINIVIL,ZESTRIL) 10 MG tablet Take 0.5 tablets (5 mg total) by mouth 2 (two) times daily. 07/11/15  Yes Theodoro Grist, MD  metoprolol succinate (TOPROL-XL) 25 MG 24 hr tablet Take 1 tablet (25 mg total)  by mouth daily. 06/14/15  Yes [name], NP  midodrine (PROAMATINE) 10 MG tablet Take 1 tablet (10 mg total) by mouth 3 (three) times daily with meals. 05/31/15  Yes Geradine Girt, DO  morphine (MS CONTIN) 60 MG 12 hr tablet Take 1 tablet (60 mg total) by mouth 2 (two) times daily. 07/11/15  Yes Theodoro Grist, MD  pantoprazole (PROTONIX) 40 MG tablet Take 1 tablet (40 mg total) by mouth 2 (two) times daily. 04/07/15  Yes Bettey Costa, MD  promethazine (PHENERGAN) 25 MG tablet Take 1 tablet (25 mg total) by mouth every 6 (six) hours as needed for nausea or vomiting. 07/03/15  Yes Carrie Mew, MD  ranitidine (ZANTAC) 150 MG capsule Take 1 capsule (150 mg total) by mouth 2 (two) times  daily. 07/03/15  Yes Carrie Mew, MD  rifaximin (XIFAXAN) 550 MG TABS tablet Take 1 tablet (550 mg total) by mouth 2 (two) times daily. 07/11/15  Yes Theodoro Grist, MD  tiZANidine (ZANAFLEX) 4 MG tablet Take 4 mg by mouth every 6 (six) hours.   Yes Historical Provider, MD  zolpidem (AMBIEN) 10 MG tablet Take 10 mg by mouth at bedtime.   Yes Historical Provider, MD  psyllium (METAMUCIL) 58.6 % packet Take 1 packet by mouth daily as needed (constipation).     Historical Provider, MD    Family History  Problem Relation Age of Onset  . Hypertension Mother   . CAD Sister      Social History  Substance Use Topics  . Smoking status: Never Smoker   . Smokeless tobacco: Never Used  . Alcohol Use: No    Allergies as of 07/31/2015 - Review Complete 07/31/2015  Allergen Reaction Noted  . Erythromycin base Other (See Comments)   . Rosiglitazone maleate Swelling   . Codeine sulfate Rash   . Tetanus-diphtheria toxoids td Rash and Other (See Comments)     Review of Systems:    All systems reviewed and negative except where noted in HPI.   Physical Exam:  Vital signs in last 24 hours: Temp:  [98 F (36.7 C)-98.3 F (36.8 C)] 98.3 F (36.8 C) (11/21 1013) Pulse Rate:  [78-100] 78 (11/21 1013) Resp:  [14-26] 18 (11/21 1013) BP: (145-169)/(74-101) 157/74 mmHg (11/21 1013) SpO2:  [94 %-100 %] 98 % (11/21 1013) Weight:  [145 lb (65.772 kg)-147 lb 11.2 oz (66.996 kg)] 147 lb 11.2 oz (66.996 kg) (11/21 0122) Last BM Date: 07/31/15 General:   Pleasant, cooperative in NAD Head:  Normocephalic and atraumatic. Eyes:   No icterus.   Conjunctiva pink. PERRLA. Ears:  Normal auditory acuity. Neck:  Supple; no masses or thyroidomegaly Lungs: Respirations even and unlabored. Lungs clear to auscultation bilaterally.   No wheezes, crackles, or rhonchi.  Heart:  Regular rate and rhythm;  Without murmur, clicks, rubs or gallops Abdomen:  Soft, nondistended, nontender. Normal bowel sounds. No  appreciable masses or hepatomegaly.  No rebound or guarding.  Rectal:  Not performed. Msk:  Symmetrical without gross deformities.    Extremities:  Without edema, cyanosis or clubbing. Neurologic:  Alert and oriented x3;  grossly normal neurologically. Skin:  Intact without significant lesions or rashes. Cervical Nodes:  No significant cervical adenopathy. Psych:  Alert and cooperative. Normal affect.  LAB RESULTS:  Recent Labs  07/31/15 1840  WBC 6.1  HGB 13.5  HCT 40.5  PLT 164   BMET  Recent Labs  07/31/15 1840 08/01/15 1155  NA 138 137  K 2.4* 2.8*  CL  103 106  CO2 25 24  GLUCOSE 182* 139*  BUN 12 10  CREATININE 0.82 0.65  CALCIUM 8.7* 7.6*   LFT  Recent Labs  08/01/15 1155  PROT 4.5*  ALBUMIN 2.5*  AST 33  ALT 25  ALKPHOS 75  BILITOT 1.0   PT/INR  Recent Labs  08/01/15 1155  LABPROT 18.9*  INR 1.58    STUDIES: Ct Abdomen Pelvis W Contrast  07/31/2015  CLINICAL DATA:  Abdominal pain, nausea, vomiting, and diarrhea for 4 days. Patient finished treatment 2 days ago for C difficile. EXAM: CT ABDOMEN AND PELVIS WITH CONTRAST TECHNIQUE: Multidetector CT imaging of the abdomen and pelvis was performed using the standard protocol following bolus administration of intravenous contrast. CONTRAST:  170m OMNIPAQUE IOHEXOL 300 MG/ML  SOLN COMPARISON:  07/03/2015 FINDINGS: Small bilateral pleural effusions with infiltration or atelectasis in the lung bases, greater on the right. Prominent diffuse abdominal ascites, increasing since prior study. No free air. Nodular contour of the liver consistent with hepatic cirrhosis. No focal liver lesions. Surgical absence of the gallbladder. No bile duct dilatation. Spleen size is normal. The pancreas, adrenal glands, kidneys, abdominal aorta, inferior vena cava, and retroperitoneal lymph nodes are unremarkable. Filling defect again demonstrated in the cardiac region and along the lesser curvature of the stomach. Stomach is not  distended. Mild distention of proximal small bowel with decompressed ileum. Contrast material is shown in the distal small bowel. Consistent with partial obstruction. Transition zone appears to be in the left mid abdomen. No cause for obstruction is demonstrated. No appreciable wall thickening. Colon is mostly decompressed. Pelvis: Appendix is not identified. Diffuse pelvic ascites. No pelvic mass or lymphadenopathy. Bladder is decompressed. No destructive bone lesions. IMPRESSION: Small bilateral pleural effusions with infiltration or atelectasis in the lung bases. Changes of hepatic cirrhosis with prominent diffuse abdominal ascites, increasing since previous study. Proximal small bowel distention with transition zone to decompressed bowel in the left mid abdomen. Changes are consistent with partial obstruction. Filling defect in the cardia/lesser curvature of the stomach again demonstrated. Electronically Signed   By: WLucienne CapersM.D.   On: 07/31/2015 23:05   [place]acent[place]  08/01/2015  CLINICAL DATA:  Ascites. EXAM: ULTRASOUND GUIDED PARACENTESIS COMPARISON:  CT scan of July 31, 2015. PROCEDURE: An ultrasound guided paracentesis was thoroughly discussed with the patient and questions answered. The benefits, risks, alternatives and complications were also discussed. The patient understands and wishes to proceed with the procedure. Written consent was obtained. Ultrasound was performed to localize and mark an adequate pocket of fluid in the right lower quadrant of the abdomen. The area was then prepped and draped in the normal sterile fashion. 1% Lidocaine was used for local anesthesia. Under ultrasound guidance a Safe-T-Centesis catheter was introduced. Paracentesis was performed. The catheter was removed and a dressing applied. COMPLICATIONS: None immediate. FINDINGS: A total of approximately 3.25 L of serous fluid was removed. A fluid sample was sent for laboratory analysis. IMPRESSION: Successful  ultrasound guided paracentesis yielding 3.25 L of ascites. Electronically Signed   By: JMarijo Conception M.D.   On: 08/01/2015 10:44   Dg Abd Acute W/chest  07/31/2015  CLINICAL DATA:  Entire abdominal pain for 5 days. Diarrhea. No vomiting. Abdominal pain and distention. EXAM: DG ABDOMEN ACUTE W/ 1V CHEST COMPARISON:  Abdomen 07/06/2015.  Chest 07/06/2015. FINDINGS: Vague nodular opacity in the right mid lung measuring 2 cm diameter. This was not identified previously. I believe this represents a patch for an EKG lead.  Suggest visual inspection of the patient and repeat imaging with removal of the knee EKG stay occurs. Mild cardiac enlargement with normal pulmonary vascularity. Mediastinal contours appear intact. Shallow inspiration with infiltration or atelectasis in the right lung base and blunting of the right costophrenic angle suggesting a small effusion. Changes may indicate pneumonia. Gas-filled and distended mid abdominal small bowel with air-fluid levels on the upright view suggesting partial obstruction. Gas is demonstrated in the colon and rectum. Colon is not dilated. No free intra-abdominal air. Lumbar scoliosis convex towards the left. Surgical clips in the right upper quadrant. IMPRESSION: Volume loss, atelectasis, and small effusion in the right lung base likely representing pneumonia. Indeterminate 2 cm right mid lung nodular opacity. This probably represents an EKG lead. Distended gas-filled mid abdominal small bowel with air-fluid levels suggesting partial obstruction. Electronically Signed   By: Lucienne Capers M.D.   On: 07/31/2015 20:39      Impression / Plan:   [name] is a 59 y.o. y/o female with diarrhea and abdominal pain with ascites. The patient follows up with Dr. Candace Cruise as an outpatient. The patient does not have SBP. The patient's abdominal pain may be due to C. difficile and C. difficile labs are pending. The cause of the patient's cirrhosis is unknown. I would maximize  the Aldactone and Lasix while keeping the creatinine and blood pressure in safe zones. The patient has been explained the plan and agrees with the plan.  Thank you for involving me in the care of this patient.      LOS: 0 days   Ollen Bowl, MD  08/01/2015, 3:35 PM   Note: This dictation was prepared with Dragon dictation along with smaller phrase technology. Any transcriptional errors that result from this process are unintentional.

## 2015-08-02 ENCOUNTER — Inpatient Hospital Stay: Payer: Commercial Managed Care - HMO

## 2015-08-02 DIAGNOSIS — K5669 Other intestinal obstruction: Secondary | ICD-10-CM

## 2015-08-02 LAB — HEMOGLOBIN: HEMOGLOBIN: 9.4 g/dL — AB (ref 12.0–16.0)

## 2015-08-02 LAB — GLUCOSE, CAPILLARY
Glucose-Capillary: 121 mg/dL — ABNORMAL HIGH (ref 65–99)
Glucose-Capillary: 143 mg/dL — ABNORMAL HIGH (ref 65–99)
Glucose-Capillary: 160 mg/dL — ABNORMAL HIGH (ref 65–99)
Glucose-Capillary: 173 mg/dL — ABNORMAL HIGH (ref 65–99)
Glucose-Capillary: 169 mg/dL — ABNORMAL HIGH (ref 65–99)
Glucose-Capillary: 160 mg/dL — ABNORMAL HIGH (ref 65–99)

## 2015-08-02 LAB — THYROID PEROXIDASE ANTIBODY: THYROID PEROXIDASE ANTIBODY: 12 [IU]/mL (ref 0–34)

## 2015-08-02 LAB — BASIC METABOLIC PANEL
Anion gap: 2 — ABNORMAL LOW (ref 5–15)
BUN: 12 mg/dL (ref 6–20)
CO2: 25 mmol/L (ref 22–32)
Calcium: 7.1 mg/dL — ABNORMAL LOW (ref 8.9–10.3)
Chloride: 107 mmol/L (ref 101–111)
Creatinine, Ser: 0.83 mg/dL (ref 0.44–1.00)
GFR calc Af Amer: >60 mL/min (ref >60)
GFR calc non Af Amer: >60 mL/min (ref >60)
Glucose, Bld: 129 mg/dL — ABNORMAL HIGH (ref 65–99)
Potassium: 3.8 mmol/L (ref 3.5–5.1)
Sodium: 134 mmol/L — ABNORMAL LOW (ref 135–145)

## 2015-08-02 LAB — T3, FREE: T3 FREE: 2 pg/mL (ref 2.0–4.4)

## 2015-08-02 LAB — CYTOLOGY - NON PAP

## 2015-08-02 MED ORDER — ALBUMIN HUMAN 25 % IV SOLN
12.5000 g | INTRAVENOUS | Status: AC
  Administered 2015-08-02: 12.5 g via INTRAVENOUS
  Filled 2015-08-02: qty 50

## 2015-08-02 MED ORDER — TIZANIDINE HCL 4 MG PO TABS: 4.0000 mg | ORAL_TABLET | Freq: Three times a day (TID) | ORAL | Status: DC | PRN

## 2015-08-02 MED ORDER — LEVOTHYROXINE SODIUM 100 MCG IV SOLR
200.0000 ug | Freq: Once | INTRAVENOUS | Status: AC
  Administered 2015-08-02: 200 ug via INTRAVENOUS
  Filled 2015-08-02: qty 10

## 2015-08-02 MED ORDER — LIOTHYRONINE SODIUM 5 MCG PO TABS
5.0000 ug | ORAL_TABLET | Freq: Three times a day (TID) | ORAL | Status: DC
  Administered 2015-08-02: 5 ug via ORAL
  Filled 2015-08-02 (×5): qty 1

## 2015-08-02 MED ORDER — OXYCODONE HCL 5 MG PO TABS
5.0000 mg | ORAL_TABLET | Freq: Four times a day (QID) | ORAL | Status: DC | PRN
  Administered 2015-08-02: 5 mg via ORAL
  Filled 2015-08-02: qty 1

## 2015-08-02 MED ORDER — LEVOTHYROXINE SODIUM 100 MCG IV SOLR
75.0000 ug | Freq: Every day | INTRAVENOUS | Status: DC
  Administered 2015-08-02: 75 ug via INTRAVENOUS
  Filled 2015-08-02: qty 5

## 2015-08-02 MED ORDER — SODIUM CHLORIDE 0.9 % IV BOLUS (SEPSIS)
1000.0000 mL | Freq: Once | INTRAVENOUS | Status: AC
  Administered 2015-08-02: 1000 mL via INTRAVENOUS

## 2015-08-02 MED ORDER — SODIUM CHLORIDE 0.9 % IV BOLUS (SEPSIS)
500.0000 mL | Freq: Once | INTRAVENOUS | Status: AC
  Administered 2015-08-02: 500 mL via INTRAVENOUS

## 2015-08-02 MED ORDER — LIOTHYRONINE SODIUM 5 MCG PO TABS
10.0000 ug | ORAL_TABLET | Freq: Once | ORAL | Status: AC
  Administered 2015-08-02: 10 ug via ORAL
  Filled 2015-08-02 (×2): qty 2

## 2015-08-02 MED ORDER — LEVOTHYROXINE SODIUM 100 MCG IV SOLR
50.0000 ug | Freq: Every day | INTRAVENOUS | Status: DC
  Administered 2015-08-03 – 2015-08-05 (×3): 50 ug via INTRAVENOUS
  Filled 2015-08-02 (×4): qty 5

## 2015-08-02 NOTE — Progress Notes (Signed)
Subjective: The patient reports continued abd pain without and vomiting. She had a episode of hypotension last night. She was treated with albumin and fluids.    Objective: Vital signs in last 24 hours: Filed Vitals:   08/02/15 0815 08/02/15 1130 08/02/15 1309 08/02/15 1327  BP: 80/52 89/56 81/50  80/50  Pulse:  46 46   Temp:   97.6 F (36.4 C)   TempSrc:   Oral   Resp:   12   Height:      Weight:      SpO2:   100%    Weight change: 4 lb 6.4 oz (1.996 kg)  Intake/Output Summary (Last 24 hours) at 08/02/15 1542 Last data filed at 08/02/15 1538  Gross per 24 hour  Intake    240 ml  Output    950 ml  Net   -710 ml     Exam: Regular rate and rhythm normal Diffusely tender abd.   Lab Results: @LABTEST2 @ Micro Results: Recent Results (from the past 240 hour(s))  Body fluid culture     Status: None (Preliminary result)   Collection Time: 08/01/15  9:18 AM  Result Value Ref Range Status   Specimen Description PERITONEAL  Final   Special Requests NONE  Final   Gram Stain PENDING  Incomplete   Culture NO GROWTH < 24 HOURS  Final   Report Status PENDING  Incomplete   Studies/Results: Ct Abdomen Pelvis W Contrast  07/31/2015  CLINICAL DATA:  Abdominal pain, nausea, vomiting, and diarrhea for 4 days. Patient finished treatment 2 days ago for C difficile. EXAM: CT ABDOMEN AND PELVIS WITH CONTRAST TECHNIQUE: Multidetector CT imaging of the abdomen and pelvis was performed using the standard protocol following bolus administration of intravenous contrast. CONTRAST:  113m OMNIPAQUE IOHEXOL 300 MG/ML  SOLN COMPARISON:  07/03/2015 FINDINGS: Small bilateral pleural effusions with infiltration or atelectasis in the lung bases, greater on the right. Prominent diffuse abdominal ascites, increasing since prior study. No free air. Nodular contour of the liver consistent with hepatic cirrhosis. No focal liver lesions. Surgical absence of the gallbladder. No bile duct dilatation. Spleen size  is normal. The pancreas, adrenal glands, kidneys, abdominal aorta, inferior vena cava, and retroperitoneal lymph nodes are unremarkable. Filling defect again demonstrated in the cardiac region and along the lesser curvature of the stomach. Stomach is not distended. Mild distention of proximal small bowel with decompressed ileum. Contrast material is shown in the distal small bowel. Consistent with partial obstruction. Transition zone appears to be in the left mid abdomen. No cause for obstruction is demonstrated. No appreciable wall thickening. Colon is mostly decompressed. Pelvis: Appendix is not identified. Diffuse pelvic ascites. No pelvic mass or lymphadenopathy. Bladder is decompressed. No destructive bone lesions. IMPRESSION: Small bilateral pleural effusions with infiltration or atelectasis in the lung bases. Changes of hepatic cirrhosis with prominent diffuse abdominal ascites, increasing since previous study. Proximal small bowel distention with transition zone to decompressed bowel in the left mid abdomen. Changes are consistent with partial obstruction. Filling defect in the cardia/lesser curvature of the stomach again demonstrated. Electronically Signed   By: WLucienne CapersM.D.   On: 07/31/2015 23:05   [place]acentesis  08/01/2015  CLINICAL DATA:  Ascites. EXAM: ULTRASOUND GUIDED PARACENTESIS COMPARISON:  CT scan of July 31, 2015. PROCEDURE: An ultrasound guided paracentesis was thoroughly discussed with the patient and questions answered. The benefits, risks, alternatives and complications were also discussed. The patient understands and wishes to proceed with the procedure. Written consent was obtained.  Ultrasound was performed to localize and mark an adequate pocket of fluid in the right lower quadrant of the abdomen. The area was then prepped and draped in the normal sterile fashion. 1% Lidocaine was used for local anesthesia. Under ultrasound guidance a Safe-T-Centesis catheter was  introduced. Paracentesis was performed. The catheter was removed and a dressing applied. COMPLICATIONS: None immediate. FINDINGS: A total of approximately 3.25 L of serous fluid was removed. A fluid sample was sent for laboratory analysis. IMPRESSION: Successful ultrasound guided paracentesis yielding 3.25 L of ascites. Electronically Signed   By: Marijo Conception, M.D.   On: 08/01/2015 10:44   Dg Abd Acute W/chest  07/31/2015  CLINICAL DATA:  Entire abdominal pain for 5 days. Diarrhea. No vomiting. Abdominal pain and distention. EXAM: DG ABDOMEN ACUTE W/ 1V CHEST COMPARISON:  Abdomen 07/06/2015.  Chest 07/06/2015. FINDINGS: Vague nodular opacity in the right mid lung measuring 2 cm diameter. This was not identified previously. I believe this represents a patch for an EKG lead. Suggest visual inspection of the patient and repeat imaging with removal of the knee EKG stay occurs. Mild cardiac enlargement with normal pulmonary vascularity. Mediastinal contours appear intact. Shallow inspiration with infiltration or atelectasis in the right lung base and blunting of the right costophrenic angle suggesting a small effusion. Changes may indicate pneumonia. Gas-filled and distended mid abdominal small bowel with air-fluid levels on the upright view suggesting partial obstruction. Gas is demonstrated in the colon and rectum. Colon is not dilated. No free intra-abdominal air. Lumbar scoliosis convex towards the left. Surgical clips in the right upper quadrant. IMPRESSION: Volume loss, atelectasis, and small effusion in the right lung base likely representing pneumonia. Indeterminate 2 cm right mid lung nodular opacity. This probably represents an EKG lead. Distended gas-filled mid abdominal small bowel with air-fluid levels suggesting partial obstruction. Electronically Signed   By: Lucienne Capers M.D.   On: 07/31/2015 20:39   Medications: I have reviewed the patient's current medications. Scheduled Meds: . apixaban   5 mg Oral BID  . citalopram  20 mg Oral Daily  . docusate sodium  100 mg Oral BID  . insulin aspart  0-9 Units Subcutaneous TID WC  . insulin detemir  12 Units Subcutaneous QHS  . [START ON 08/03/2015] levothyroxine  50 mcg Intravenous Daily  . liothyronine  5 mcg Oral 3 times per day  . morphine  60 mg Oral BID  . pantoprazole  40 mg Oral BID  . psyllium  1 packet Oral QODAY  . rifaximin  550 mg Oral BID  . sodium chloride  3 mL Intravenous Q12H  . sucralfate  1 g Oral QID   Continuous Infusions:  PRN Meds:.HYDROmorphone (DILAUDID) injection, ondansetron **OR** ondansetron (ZOFRAN) IV, oxyCODONE, promethazine, tiZANidine   Assessment: Active Problems:   Small bowel obstruction (HCC)   Abdominal pain   Ascites    Plan: 1. Partial SBO  Make patient NPO  Get KUB today. 2. Cirrhosis  Stable   LOS: 1 day   Daren Farrie Sann 08/02/2015, 3:42 PM

## 2015-08-02 NOTE — Progress Notes (Signed)
Dr. Posey Pronto notified of pt's low BP and low HR.  Asymptomatic. MD stated to keep dilaudid PRN order but d/c morphine and oxycodone. Continue to monitor pt for now since she is asymptomatic. If pt becomes symptomatic and BP becomes lower she stated to call back. Will cont. To monitor.

## 2015-08-02 NOTE — Progress Notes (Signed)
Notified Dr Darvin Neighbours of pt BP remains low (80/52) following completion of latest bolus, and that P rate 47; also notified Dr of pt request for pain meds, and that she currently only had IV dilaudid; asked Dr if he wanted to give that w/ BP and P low; Dr stated do not give IV dilaudid right now; Dr acknowledged, stated he would put in orders

## 2015-08-02 NOTE — Progress Notes (Signed)
[facility] [place] NAME: [name]    MR#:  [id number]  DATE OF BIRTH:  Oct 13, 1955  SUBJECTIVE:  CHIEF COMPLAINT:   Chief Complaint  Patient presents with  . Abdominal Pain  . Diarrhea   abdominal pain is persistent but improved a little after paracentesis. Has had hypertension since yesterday night. Afebrile.  REVIEW OF SYSTEMS:    Review of Systems  Constitutional: Positive for malaise/fatigue. Negative for fever and chills.  HENT: Negative for sore throat.   Eyes: Negative for blurred vision, double vision and pain.  Respiratory: Negative for cough, hemoptysis, shortness of breath and wheezing.   Cardiovascular: Negative for chest pain, palpitations, orthopnea and leg swelling.  Gastrointestinal: Positive for nausea and abdominal pain. Negative for heartburn, vomiting, diarrhea and constipation.  Genitourinary: Negative for dysuria and hematuria.  Musculoskeletal: Negative for back pain and joint pain.  Skin: Negative for rash.  Neurological: Positive for weakness. Negative for sensory change, speech change, focal weakness and headaches.  Endo/Heme/Allergies: Does not bruise/bleed easily.  Psychiatric/Behavioral: Negative for depression. The patient is not nervous/anxious.       DRUG ALLERGIES:   Allergies  Allergen Reactions  . Erythromycin Base Other (See Comments)    Reaction:  Fever   . Rosiglitazone Maleate Swelling  . Codeine Sulfate Rash  . Tetanus-Diphtheria Toxoids Td Rash and Other (See Comments)    Reaction:  Fever     VITALS:  Blood pressure 81/50, pulse 46, temperature 97.6 F (36.4 C), temperature source Oral, resp. rate 12, height 5' 3"  (1.6 m), weight 67.767 kg (149 lb 6.4 oz), SpO2 100 %.  PHYSICAL EXAMINATION:   Physical Exam  GENERAL:  59 y.o.-year-old patient lying in the bed. EYES: Pupils equal, round, reactive to light and accommodation. No scleral icterus. Extraocular muscles  intact.  HEENT: Head atraumatic, normocephalic. Oropharynx and nasopharynx clear.  NECK:  Supple, no jugular venous distention. No thyroid enlargement, no tenderness.  LUNGS: Normal breath sounds bilaterally, no wheezing, rales, rhonchi. No use of accessory muscles of respiration.  CARDIOVASCULAR: S1, S2 normal. No murmurs, rubs, or gallops.  ABDOMEN: Soft, nondistended. Bowel sounds present. No organomegaly or mass. Diffuse tenderness EXTREMITIES: No cyanosis, clubbing or edema b/l.    NEUROLOGIC: Cranial nerves II through XII are intact. No focal Motor or sensory deficits b/l.   PSYCHIATRIC: The patient is alert and oriented x 3.  SKIN: No obvious rash, lesion, or ulcer.    LABORATORY PANEL:   CBC  Recent Labs Lab 08/01/15 2301 08/02/15 0716  WBC 4.6  --   HGB 9.4* 9.4*  HCT 29.1*  --   PLT 104*  --    ------------------------------------------------------------------------------------------------------------------  Chemistries   Recent Labs Lab 08/01/15 0115 08/01/15 1155 08/02/15 0716  NA  --  137 134*  K  --  2.8* 3.8  CL  --  106 107  CO2  --  24 25  GLUCOSE  --  139* 129*  BUN  --  10 12  CREATININE  --  0.65 0.83  CALCIUM  --  7.6* 7.1*  MG 1.9  --   --   AST  --  33  --   ALT  --  25  --   ALKPHOS  --  75  --   BILITOT  --  1.0  --    ------------------------------------------------------------------------------------------------------------------  Cardiac Enzymes No results for input(s): TROPONINI in the last 168 hours. ------------------------------------------------------------------------------------------------------------------  RADIOLOGY:  Ct Abdomen  Pelvis W Contrast  07/31/2015  CLINICAL DATA:  Abdominal pain, nausea, vomiting, and diarrhea for 4 days. Patient finished treatment 2 days ago for C difficile. EXAM: CT ABDOMEN AND PELVIS WITH CONTRAST TECHNIQUE: Multidetector CT imaging of the abdomen and pelvis was performed using the standard  protocol following bolus administration of intravenous contrast. CONTRAST:  180m OMNIPAQUE IOHEXOL 300 MG/ML  SOLN COMPARISON:  07/03/2015 FINDINGS: Small bilateral pleural effusions with infiltration or atelectasis in the lung bases, greater on the right. Prominent diffuse abdominal ascites, increasing since prior study. No free air. Nodular contour of the liver consistent with hepatic cirrhosis. No focal liver lesions. Surgical absence of the gallbladder. No bile duct dilatation. Spleen size is normal. The pancreas, adrenal glands, kidneys, abdominal aorta, inferior vena cava, and retroperitoneal lymph nodes are unremarkable. Filling defect again demonstrated in the cardiac region and along the lesser curvature of the stomach. Stomach is not distended. Mild distention of proximal small bowel with decompressed ileum. Contrast material is shown in the distal small bowel. Consistent with partial obstruction. Transition zone appears to be in the left mid abdomen. No cause for obstruction is demonstrated. No appreciable wall thickening. Colon is mostly decompressed. Pelvis: Appendix is not identified. Diffuse pelvic ascites. No pelvic mass or lymphadenopathy. Bladder is decompressed. No destructive bone lesions. IMPRESSION: Small bilateral pleural effusions with infiltration or atelectasis in the lung bases. Changes of hepatic cirrhosis with prominent diffuse abdominal ascites, increasing since previous study. Proximal small bowel distention with transition zone to decompressed bowel in the left mid abdomen. Changes are consistent with partial obstruction. Filling defect in the cardia/lesser curvature of the stomach again demonstrated. Electronically Signed   By: WLucienne CapersM.D.   On: 07/31/2015 23:05   [place]oreaParacentesis  08/01/2015  CLINICAL DATA:  Ascites. EXAM: ULTRASOUND GUIDED PARACENTESIS COMPARISON:  CT scan of July 31, 2015. PROCEDURE: An ultrasound guided paracentesis was thoroughly discussed with  the patient and questions answered. The benefits, risks, alternatives and complications were also discussed. The patient understands and wishes to proceed with the procedure. Written consent was obtained. Ultrasound was performed to localize and mark an adequate pocket of fluid in the right lower quadrant of the abdomen. The area was then prepped and draped in the normal sterile fashion. 1% Lidocaine was used for local anesthesia. Under ultrasound guidance a Safe-T-Centesis catheter was introduced. Paracentesis was performed. The catheter was removed and a dressing applied. COMPLICATIONS: None immediate. FINDINGS: A total of approximately 3.25 L of serous fluid was removed. A fluid sample was sent for laboratory analysis. IMPRESSION: Successful ultrasound guided paracentesis yielding 3.25 L of ascites. Electronically Signed   By: JMarijo Conception M.D.   On: 08/01/2015 10:44   Dg Abd Acute W/chest  07/31/2015  CLINICAL DATA:  Entire abdominal pain for 5 days. Diarrhea. No vomiting. Abdominal pain and distention. EXAM: DG ABDOMEN ACUTE W/ 1V CHEST COMPARISON:  Abdomen 07/06/2015.  Chest 07/06/2015. FINDINGS: Vague nodular opacity in the right mid lung measuring 2 cm diameter. This was not identified previously. I believe this represents a patch for an EKG lead. Suggest visual inspection of the patient and repeat imaging with removal of the knee EKG stay occurs. Mild cardiac enlargement with normal pulmonary vascularity. Mediastinal contours appear intact. Shallow inspiration with infiltration or atelectasis in the right lung base and blunting of the right costophrenic angle suggesting a small effusion. Changes may indicate pneumonia. Gas-filled and distended mid abdominal small bowel with air-fluid levels on the upright view suggesting partial  obstruction. Gas is demonstrated in the colon and rectum. Colon is not dilated. No free intra-abdominal air. Lumbar scoliosis convex towards the left. Surgical clips in the  right upper quadrant. IMPRESSION: Volume loss, atelectasis, and small effusion in the right lung base likely representing pneumonia. Indeterminate 2 cm right mid lung nodular opacity. This probably represents an EKG lead. Distended gas-filled mid abdominal small bowel with air-fluid levels suggesting partial obstruction. Electronically Signed   By: Lucienne Capers M.D.   On: 07/31/2015 20:39     ASSESSMENT AND PLAN:   This is a 59 year old Caucasian female admitted for abdominal pain secondary to ascites.  # Hypertension Likely from large volume paracentesis. Stat fluid bolus. IV albumin. Monitor. No signs of infection.  # Ascites, paracentesis showed transudate. No significant WBC or protein. Due to cirrhosis. Patient started on Lasix and Aldactone but will hold due to hypotension. GI on board.  # A. fib: Rate controlled Presently normal sinus rhythm with bradycardia. Continue Eliquis Amiodarone held due to cirrhosis and hypothyroidism. I have requested cardiology see the patient for the placement of image her own now that we had to stop this.  #Hypothyroidism Started on IV levothyroxine. Discussed with endocrinology if patient could have myxedema state causing hypotension and bradycardia. Unlikely as per discussion. IV T3 not available and patient will be placed on IV T4.  # Essential hypertension: Hold lisinopril and metoprolol  # Diabetes mellitus type 2: Continue basal insulin. Sliding scale insulin  # Hyperlipidemia: Continue statin  # Depression: Continue Celexa   All the records are reviewed and case discussed with Care Management/Social Workerr. Management plans discussed with the patient, family and they are in agreement.  CODE STATUS: FULL  DVT Prophylaxis: SCDs  TOTAL CC TIME TAKING CARE OF THIS PATIENT: 40 minutes.   POSSIBLE D/C IN 2-3 DAYS, DEPENDING ON CLINICAL CONDITION.   Hillary Bow R M.D on 08/02/2015 at 1:19 PM  Between 7am to 6pm - Pager -  [phone]  After 6pm go to [email or web address] - password [email or web address] [facility]  Office  [phone]  CC: Primary care physician; Sheral Flow, NP    Note: This dictation was prepared with Dragon dictation along with smaller phrase technology. Any transcriptional errors that result from this process are unintentional.

## 2015-08-02 NOTE — Progress Notes (Signed)
Notified Dr Darvin Neighbours that pt BP still low (80/50) following all previous interventions of today; Dr acknowledged, no new orders

## 2015-08-02 NOTE — Progress Notes (Addendum)
Consulting Service: [facility] Endocrinology  MD Requesting Consult: Hillary Bow  SUBJECTIVE: Reason for Consultation: hypothyroidism on amiodarone  History of Present Illness: [name] is a 59 y.o. female with PMH A. Fib on amiodarone, Hypothyroidism, Type 2 DM, Liver Cirrhosis complicated by ascites, and recent C. Diff Colitis admitted with abdominal pain and swelling. She was recently diagnosed with liver cirrhosis and has undergone therapeutic paracentesis. Endocrinology has been consulted regarding hypothyroidism. TSH was 61.   Interval history:  Since initial consultation yesterday, IV levothyroxine 50 mcg once daily was initiated. However, overnight she was became bradycardic and hypotensive with HR 40's and SBP 80's. She reports nausea without vomiting. She feels lightheaded especially while ambulating to the bathroom. She also reports significant abdominal pain controlled with dilaudid but not with morphine.   O: Scheduled Meds: . apixaban  5 mg Oral BID  . citalopram  20 mg Oral Daily  . docusate sodium  100 mg Oral BID  . insulin aspart  0-9 Units Subcutaneous TID WC  . insulin detemir  12 Units Subcutaneous QHS  . levothyroxine  75 mcg Intravenous Daily  . morphine  60 mg Oral BID  . pantoprazole  40 mg Oral BID  . psyllium  1 packet Oral QODAY  . rifaximin  550 mg Oral BID  . sodium chloride  3 mL Intravenous Q12H  . sucralfate  1 g Oral QID  . tiZANidine  4 mg Oral Q6H   Continuous Infusions:  PRN Meds:.HYDROmorphone (DILAUDID) injection, ondansetron **OR** ondansetron (ZOFRAN) IV, oxyCODONE, promethazine  Allergies  Allergen Reactions  . Erythromycin Base Other (See Comments)    Reaction:  Fever   . Rosiglitazone Maleate Swelling  . Codeine Sulfate Rash  . Tetanus-Diphtheria Toxoids Td Rash and Other (See Comments)    Reaction:  Fever    Filed Vitals:   08/02/15 0626 08/02/15 0815  BP: 76/46 80/52  Pulse: 45   Temp:    Resp:     Physical  Exam: Gen: no acute distress, well-nourished, well-appearing, sitting in bed alert  Neuro: alert and oriented x 3, answering all questions appropriately, grossly non focal, 2+ patellar DTRs  HEENT: [place]/AT, eyes anicteric, EOMI, mucous membranes dry, no oropharyngeal lesions Neck: no thyroid enlargement or nodules noted, no cervical lymphadenopathy CAD: distant heart sounds, bradycardic, s1 s2 PULM: rales bilaterally up to the mid Lung fields, no wheezes GI: soft, distended  EXT: no clubbing, cyanosis or edema Skin: extremely dry, warm. There are several healing lesions on the lower extremities where blisters once were.   Labs: Component  Latest Ref Rng 01/12/2015 03/11/2015 04/05/2015 05/18/2015 05/27/2015  TSH  0.350 - 4.500 uIU/mL 4.460 5.108 (H) 1.581 5.073 (H) 4.609 (H)   Component  Latest Ref Rng 08/01/2015  TSH  0.350 - 4.500 uIU/mL 61.012 (H)       Component     Latest Ref Rng 08/01/2015  T3, Free     2.0 - 4.4 pg/mL 2.0  Free T4     0.61 - 1.12 ng/dL 0.58 (L)  Thyroperoxidase Ab SerPl-aCnc     0 - 34 IU/mL 12   BMP Latest Ref Rng 08/02/2015 08/01/2015 07/31/2015  Glucose 65 - 99 mg/dL 129(H) 139(H) 182(H)  BUN 6 - 20 mg/dL 12 10 12   Creatinine 0.44 - 1.00 mg/dL 0.83 0.65 0.82  Sodium 135 - 145 mmol/L 134(L) 137 138  Potassium 3.5 - 5.1 mmol/L 3.8 2.8(LL) 2.4(LL)  Chloride 101 - 111 mmol/L 107 106 103  CO2  22 - 32 mmol/L 25 24 25   Calcium 8.9 - 10.3 mg/dL 7.1(L) 7.6(L) 8.7(L)   ASSESSMENT:  Profound hypothyroidism without myxedema coma Bradycardia and Hypotension following paracentesis  RECOMMENDATIONS:   Dose IV levothyroxine 200 mcg once then 50 mcg IV daily.  Please switch to oral levothyroxine 75 mcg daily once stable and tolerating PO. Dose liothyronine 20 mcg IV once, then 5 mcg every 8 hours until BP and HR stabilizes. Continue supportive care with IVFs, etc. Telemetry monitoring given risk of arrhythmia. Will follow along  closely.  Thank you for allowing me to participate in this patient's care.  Atha Starks, MD [facility] Endocrinology   Addendum: Spoke with inpatient pharmacy. IV liothyronine is not on formulary. Dose oral cytomel 10 mcg once, then 5 mcg every 8 hours.

## 2015-08-03 DIAGNOSIS — R1010 Upper abdominal pain, unspecified: Secondary | ICD-10-CM

## 2015-08-03 LAB — COMPREHENSIVE METABOLIC PANEL
ALK PHOS: 67 U/L (ref 38–126)
ALT: 19 U/L (ref 14–54)
AST: 18 U/L (ref 15–41)
Albumin: 2.6 g/dL — ABNORMAL LOW (ref 3.5–5.0)
Anion gap: 4 — ABNORMAL LOW (ref 5–15)
BUN: 11 mg/dL (ref 6–20)
CALCIUM: 8 mg/dL — AB (ref 8.9–10.3)
CO2: 26 mmol/L (ref 22–32)
CREATININE: 0.87 mg/dL (ref 0.44–1.00)
Chloride: 107 mmol/L (ref 101–111)
GFR calc non Af Amer: >60 mL/min (ref >60)
GLUCOSE: 117 mg/dL — AB (ref 65–99)
Potassium: 4 mmol/L (ref 3.5–5.1)
SODIUM: 137 mmol/L (ref 135–145)
Total Bilirubin: 0.6 mg/dL (ref 0.3–1.2)
Total Protein: 4.9 g/dL — ABNORMAL LOW (ref 6.5–8.1)

## 2015-08-03 LAB — CBC WITH DIFFERENTIAL/PLATELET
Basophils Absolute: 0 10*3/uL (ref 0–0.1)
Basophils Relative: 1 %
EOS ABS: 0 10*3/uL (ref 0–0.7)
Eosinophils Relative: 1 %
HCT: 31 % — ABNORMAL LOW (ref 35.0–47.0)
HEMOGLOBIN: 10 g/dL — AB (ref 12.0–16.0)
LYMPHS ABS: 0.8 10*3/uL — AB (ref 1.0–3.6)
LYMPHS PCT: 17 %
MCH: 31.7 pg (ref 26.0–34.0)
MCHC: 32.1 g/dL (ref 32.0–36.0)
MCV: 98.5 fL (ref 80.0–100.0)
Monocytes Absolute: 0.3 10*3/uL (ref 0.2–0.9)
Monocytes Relative: 7 %
NEUTROS ABS: 3.4 10*3/uL (ref 1.4–6.5)
NEUTROS PCT: 74 %
Platelets: 105 10*3/uL — ABNORMAL LOW (ref 150–440)
RBC: 3.15 MIL/uL — AB (ref 3.80–5.20)
RDW: 16.5 % — ABNORMAL HIGH (ref 11.5–14.5)
WBC: 4.6 10*3/uL (ref 3.6–11.0)

## 2015-08-03 LAB — GLUCOSE, CAPILLARY
Glucose-Capillary: 101 mg/dL — ABNORMAL HIGH (ref 65–99)
Glucose-Capillary: 95 mg/dL (ref 65–99)
Glucose-Capillary: 99 mg/dL (ref 65–99)
Glucose-Capillary: 121 mg/dL — ABNORMAL HIGH (ref 65–99)

## 2015-08-03 MED ORDER — MORPHINE SULFATE ER 15 MG PO TBCR
30.0000 mg | EXTENDED_RELEASE_TABLET | Freq: Two times a day (BID) | ORAL | Status: DC
  Administered 2015-08-03 – 2015-08-06 (×7): 30 mg via ORAL
  Filled 2015-08-03 (×7): qty 2

## 2015-08-03 MED ORDER — DEXTROSE 5 % IV SOLN
INTRAVENOUS | Status: DC
  Administered 2015-08-03 – 2015-08-05 (×2): via INTRAVENOUS

## 2015-08-03 NOTE — Care Management Important Message (Signed)
Important Message  Patient Details  Name: [name] MRN: [id number] Date of Birth: [birth year]   Medicare Important Message Given:  Yes    Juliann Pulse A [name] 08/03/2015, 10:04 AM

## 2015-08-03 NOTE — Progress Notes (Signed)
[facility] [place] NAME: Ridley Dileo    MR#:  [id number]  DATE OF BIRTH:  Jun 22, 1956  SUBJECTIVE:  CHIEF COMPLAINT:   Chief Complaint  Patient presents with  . Abdominal Pain  . Diarrhea   abdominal pain is persistent but improved a little after paracentesis. Abdominal x-ray is also improving: Patient is nothing by mouth Feels dehydrated, being initiated on D5 water at 40 cc an hour  REVIEW OF SYSTEMS:    Review of Systems  Constitutional: Positive for malaise/fatigue. Negative for fever and chills.  HENT: Negative for sore throat.   Eyes: Negative for blurred vision, double vision and pain.  Respiratory: Negative for cough, hemoptysis, shortness of breath and wheezing.   Cardiovascular: Negative for chest pain, palpitations, orthopnea and leg swelling.  Gastrointestinal: Positive for nausea and abdominal pain. Negative for heartburn, vomiting, diarrhea and constipation.  Genitourinary: Negative for dysuria and hematuria.  Musculoskeletal: Negative for back pain and joint pain.  Skin: Negative for rash.  Neurological: Positive for weakness. Negative for sensory change, speech change, focal weakness and headaches.  Endo/Heme/Allergies: Does not bruise/bleed easily.  Psychiatric/Behavioral: Negative for depression. The patient is not nervous/anxious.       DRUG ALLERGIES:   Allergies  Allergen Reactions  . Erythromycin Base Other (See Comments)    Reaction:  Fever   . Rosiglitazone Maleate Swelling  . Codeine Sulfate Rash  . Tetanus-Diphtheria Toxoids Td Rash and Other (See Comments)    Reaction:  Fever     VITALS:  Blood pressure 125/61, pulse 62, temperature 98.3 F (36.8 C), temperature source Oral, resp. rate 17, height 5' 3"  (1.6 m), weight 69.673 kg (153 lb 9.6 oz), SpO2 98 %.  PHYSICAL EXAMINATION:   Physical Exam  GENERAL:  59 y.o.-year-old patient lying in the bed. EYES: Pupils equal, round, reactive  to light and accommodation. No scleral icterus. Extraocular muscles intact.  HEENT: Head atraumatic, normocephalic. Oropharynx and nasopharynx clear.  NECK:  Supple, no jugular venous distention. No thyroid enlargement, no tenderness.  LUNGS: Normal breath sounds bilaterally, no wheezing, rales, rhonchi. No use of accessory muscles of respiration.  CARDIOVASCULAR: S1, S2 normal. No murmurs, rubs, or gallops.  ABDOMEN: Soft, nondistended. Bowel sounds present. No organomegaly or mass. Diffuse tenderness EXTREMITIES: No cyanosis, clubbing or edema b/l.    NEUROLOGIC: Cranial nerves II through XII are intact. No focal Motor or sensory deficits b/l.   PSYCHIATRIC: The patient is alert and oriented x 3.  SKIN: No obvious rash, lesion, or ulcer.    LABORATORY PANEL:   CBC  Recent Labs Lab 08/03/15 0518  WBC 4.6  HGB 10.0*  HCT 31.0*  PLT 105*   ------------------------------------------------------------------------------------------------------------------  Chemistries   Recent Labs Lab 08/01/15 0115  08/03/15 0518  NA  --   < > 137  K  --   < > 4.0  CL  --   < > 107  CO2  --   < > 26  GLUCOSE  --   < > 117*  BUN  --   < > 11  CREATININE  --   < > 0.87  CALCIUM  --   < > 8.0*  MG 1.9  --   --   AST  --   < > 18  ALT  --   < > 19  ALKPHOS  --   < > 67  BILITOT  --   < > 0.6  < > =  values in this interval not displayed. ------------------------------------------------------------------------------------------------------------------  Cardiac Enzymes No results for input(s): TROPONINI in the last 168 hours. ------------------------------------------------------------------------------------------------------------------  RADIOLOGY:  Dg Abd 1 View  08/02/2015  CLINICAL DATA:  Evaluation of abdominal discomfort.  Distention. EXAM: ABDOMEN - 1 VIEW COMPARISON:  07/31/2015 FINDINGS: Gaseous distention of the small or large bowel loops appear improved from previous exam.  Enteric contrast material is identified within the colon. IMPRESSION: 1. Findings compatible with improving bowel obstruction. Electronically Signed   By: Kerby Moors M.D.   On: 08/02/2015 15:47     ASSESSMENT AND PLAN:   This is a 59 year old Caucasian female admitted for abdominal pain secondary to ascites and partial small bowel obstruction.  # Hypotension Likely from large volume paracentesis. Improved on IV fluid bolus, . IV albumin. Monitor. No signs of infection.  # Ascites, paracentesis showed transudate. No significant WBC or protein. Due to cirrhosis. Patient was started on Lasix and Aldactone but will hold due to hypotension and nothing by mouth status. GI on board, input is appreciated.  # . Partial small bowel obstruction, patient is nothing by mouth at present, initiated on low rate D5 water, get surgical consultation  # A. fib: Rate controlled Presently normal sinus rhythm with bradycardia. Continue Eliquis Amiodarone held due to cirrhosis and hypothyroidism. Radiology saw patient in consultation and recommended Eliquis  #Hypothyroidism Continue IV levothyroxine for now, heart rate is improving. .  # Essential hypertension: Hold lisinopril and metoprolol for now  # Diabetes mellitus type 2: Continue basal insulin. Sliding scale insulin, blood glucose is ranging between 95-170  # Hyperlipidemia: Continue statin  # Depression: Continue Celexa   All the records are reviewed and case discussed with Care Management/Social Workerr. Management plans discussed with the patient, family and they are in agreement.  CODE STATUS: FULL  DVT Prophylaxis: SCDs  TOTAL TIME TAKING CARE OF THIS PATIENT: 40 minutes.  Discussed with Dr. Allen Norris, gastroenterology POSSIBLE D/C IN 2-3 DAYS, DEPENDING ON CLINICAL CONDITION.   Theodoro Grist M.D on 08/03/2015 at 3:31 PM  Between 7am to 6pm - Pager - [phone]  After 6pm go to [email or web address] - password [email or web address] [facility]  Office  [phone]  CC: Primary care physician; Sheral Flow, NP    Note: This dictation was prepared with Dragon dictation along with smaller phrase technology. Any transcriptional errors that result from this process are unintentional.

## 2015-08-03 NOTE — Progress Notes (Signed)
Consulting Service: [facility] Endocrinology  MD Requesting Consult: Hillary Bow  SUBJECTIVE: Reason for Consultation: hypothyroidism on amiodarone  History of Present Illness: [name] is a 59 y.o. female with PMH A. Fib on amiodarone, Hypothyroidism, Type 2 DM, Liver Cirrhosis complicated by ascites, and recent C. Diff Colitis admitted with abdominal pain and swelling. She was recently diagnosed with liver cirrhosis and has undergone therapeutic paracentesis. Endocrinology has been consulted regarding hypothyroidism. TSH was 61.   Interval history:  Given bradycardia and hypotension yesterday, more aggressive thyroid hormone replacement was recommended. In addition she received IV fluids and albumin. As far as thyroid hormone replacement, she received IV levothyroxine bolus yesterday (275 mcg total). IV liothyronine is not on formulary and so oral cytomel was given (15 mcg total). The patient reports difficulty swallowing the pills, stating that she felt "choked up." This morning's dose of cytomel has not been given. Her main complaint is abdominal pain. Her blood pressure and heart rate has improved. She still feels lightheaded but nausea has improved.     O: Scheduled Meds: . apixaban  5 mg Oral BID  . citalopram  20 mg Oral Daily  . docusate sodium  100 mg Oral BID  . insulin aspart  0-9 Units Subcutaneous TID WC  . insulin detemir  12 Units Subcutaneous QHS  . levothyroxine  50 mcg Intravenous Daily  . liothyronine  5 mcg Oral 3 times per day  . morphine  30 mg Oral Q12H  . pantoprazole  40 mg Oral BID  . psyllium  1 packet Oral QODAY  . rifaximin  550 mg Oral BID  . sodium chloride  3 mL Intravenous Q12H  . sucralfate  1 g Oral QID   Continuous Infusions:  PRN Meds:.ondansetron **OR** ondansetron (ZOFRAN) IV, promethazine, tiZANidine  Allergies  Allergen Reactions  . Erythromycin Base Other (See Comments)    Reaction:  Fever   . Rosiglitazone Maleate Swelling   . Codeine Sulfate Rash  . Tetanus-Diphtheria Toxoids Td Rash and Other (See Comments)    Reaction:  Fever    Filed Vitals:   08/03/15 0625 08/03/15 0750  BP:  100/58  Pulse: 55   Temp:    Resp:     Physical Exam: Gen: no acute distress, well-nourished, well-appearing, sitting in bed alert  Neuro: alert and oriented x 3, answering all questions appropriately, grossly non focal, 2+ patellar DTRs  HEENT: [place]/AT, eyes anicteric, EOMI, mucous membranes dry, no oropharyngeal lesions Neck: no thyroid enlargement or nodules noted, no cervical lymphadenopathy CAD: distant heart sounds, bradycardic, s1 s2 PULM: rales bilaterally up to the mid Lung fields, no wheezes GI: soft, distended  EXT: no clubbing, cyanosis or edema Skin: extremely dry, warm. There are several healing lesions on the lower extremities.   Labs: Component  Latest Ref Rng 01/12/2015 03/11/2015 04/05/2015 05/18/2015 05/27/2015  TSH  0.350 - 4.500 uIU/mL 4.460 5.108 (H) 1.581 5.073 (H) 4.609 (H)   Component  Latest Ref Rng 08/01/2015  TSH  0.350 - 4.500 uIU/mL 61.012 (H)       Component     Latest Ref Rng 08/01/2015  T3, Free     2.0 - 4.4 pg/mL 2.0  Free T4     0.61 - 1.12 ng/dL 0.58 (L)  Thyroperoxidase Ab SerPl-aCnc     0 - 34 IU/mL 12   BMP Latest Ref Rng 08/03/2015 08/02/2015 08/01/2015  Glucose 65 - 99 mg/dL 117(H) 129(H) 139(H)  BUN 6 - 20 mg/dL 11 12  10  Creatinine 0.44 - 1.00 mg/dL 0.87 0.83 0.65  Sodium 135 - 145 mmol/L 137 134(L) 137  Potassium 3.5 - 5.1 mmol/L 4.0 3.8 2.8(LL)  Chloride 101 - 111 mmol/L 107 107 106  CO2 22 - 32 mmol/L 26 25 24   Calcium 8.9 - 10.3 mg/dL 8.0(L) 7.1(L) 7.6(L)   ASSESSMENT:  Profound hypothyroidism without myxedema coma, stable Bradycardia and Hypotension, improving  RECOMMENDATIONS:   Continue levothyroxine 50 mcg IV once daily.  Please switch to oral levothyroxine 75 mcg daily once stable and tolerating PO. Would discontinue cytomel  since patient is having difficulty swallowing. BP and HR are improving. Continue supportive care. Telemetry monitoring given risk of arrhythmia. Will see patient in follow up 4 weeks post-discharge. Please discharge home on 75 mcg daily of levothyroxine.  Will sign off. Thank you for allowing me to participate in this patient's care.  Atha Starks, MD [facility] Endocrinology

## 2015-08-03 NOTE — Progress Notes (Signed)
59 yr old female with multiple medical issues including Diabetes, Hx of stomach ulcers, Cirrhosis disease, Gyn CA, TIA, Pancreatis, Afib and recent hospitalization for C.diff colitis.  During her hospital course she has had paracentesis without evidence of SBP.  Patient states having epigastric pain that is constant, eating and drinking doesn't make it better or worse.  It began when she went home after C.diff admission and has continued to worsen.  She endorses early satiety for the past week as well.  She has been passing gas but no longer having diarrhea stools for the past few days.  She has had some nausea but no vomiting.    Filed Vitals:   08/03/15 0750 08/03/15 1333  BP: 100/58 125/61  Pulse:  62  Temp:  98.3 F (36.8 C)  Resp:  17   PE:  Gen: NAD Res: CTAB/L  CArdio: RRR Abd: soft, pain in epigastrium and long RUQ along costal margin and just below, in the area of liver, some firmness felt, no discrete mass but could be liver edge, otherwise non-tender, no cva tenderness Ext: no edema  CBC Latest Ref Rng 08/03/2015 08/02/2015 08/01/2015  WBC 3.6 - 11.0 K/uL 4.6 - 4.6  Hemoglobin 12.0 - 16.0 g/dL 10.0(L) 9.4(L) 9.4(L)  Hematocrit 35.0 - 47.0 % 31.0(L) - 29.1(L)  Platelets 150 - 440 K/uL 105(L) - 104(L)    CMP Latest Ref Rng 08/03/2015 08/02/2015 08/01/2015  Glucose 65 - 99 mg/dL 117(H) 129(H) 139(H)  BUN 6 - 20 mg/dL 11 12 10   Creatinine 0.44 - 1.00 mg/dL 0.87 0.83 0.65  Sodium 135 - 145 mmol/L 137 134(L) 137  Potassium 3.5 - 5.1 mmol/L 4.0 3.8 2.8(LL)  Chloride 101 - 111 mmol/L 107 107 106  CO2 22 - 32 mmol/L 26 25 24   Calcium 8.9 - 10.3 mg/dL 8.0(L) 7.1(L) 7.6(L)  Total Protein 6.5 - 8.1 g/dL 4.9(L) - 4.5(L)  Total Bilirubin 0.3 - 1.2 mg/dL 0.6 - 1.0  Alkaline Phos 38 - 126 U/L 67 - 75  AST 15 - 41 U/L 18 - 33  ALT 14 - 54 U/L 19 - 25   CT scan 11/20: . Changes of hepatic cirrhosis with prominent diffuse abdominal ascites, increasing since previous study.  Proximal small bowel distention with transition zone to decompressed bowel in the left mid abdomen. Changes are consistent with partial obstruction. Filling defect in the cardia/lesser curvature of the stomach again demonstrated.  AXR: 11/22:  FINDINGS: Gaseous distention of the small or large bowel loops appear improved from previous exam. Enteric contrast material is identified within the colon.  IMPRESSION: 1. Findings compatible with improving bowel obstruction.  A/p: 59 yr old with multiple medical issues, ascites, cirrhosis and epigastric abdominal pain with early satiety.  I have reviewed her history, and her images and the radiology reads.  I do no think she has a partial small bowel obstruction given that she is passing gas, movement of contrast on imaging and improvement in the gas pattern.  Her pain is along the area of the liver edge, may have some capsular stretch that could be causing tenderness.  She also has a history of GERD, peptic ulcer disease and hiatal hernia with repair 30 years prior.  Agree with carafate, PPI BID, would avoid narcotics if possible, could have some component of gastroparesis with long term diabetes and MS contin use, likely contributing to early satiety and nausea. Would slowly advance diet.

## 2015-08-04 ENCOUNTER — Inpatient Hospital Stay: Payer: Commercial Managed Care - HMO

## 2015-08-04 LAB — GLUCOSE, CAPILLARY
Glucose-Capillary: 77 mg/dL (ref 65–99)
Glucose-Capillary: 80 mg/dL (ref 65–99)
Glucose-Capillary: 153 mg/dL — ABNORMAL HIGH (ref 65–99)
Glucose-Capillary: 157 mg/dL — ABNORMAL HIGH (ref 65–99)

## 2015-08-04 NOTE — Progress Notes (Signed)
[facility] [place] at [place] NAME: Shulamis Wenberg    MR#:  [id number]  DATE OF BIRTH:  11/13/1955  SUBJECTIVE:  CHIEF COMPLAINT:   Chief Complaint  Patient presents with  . Abdominal Pain  . Diarrhea   abdominal pain is persistent but improved overall and mostly concentrated in the upper abdomen . He is being initiated on clear liquid diet , feels comfortable today  REVIEW OF SYSTEMS:    Review of Systems  Constitutional: Positive for malaise/fatigue. Negative for fever and chills.  HENT: Negative for sore throat.   Eyes: Negative for blurred vision, double vision and pain.  Respiratory: Negative for cough, hemoptysis, shortness of breath and wheezing.   Cardiovascular: Negative for chest pain, palpitations, orthopnea and leg swelling.  Gastrointestinal: Positive for nausea and abdominal pain. Negative for heartburn, vomiting, diarrhea and constipation.  Genitourinary: Negative for dysuria and hematuria.  Musculoskeletal: Negative for back pain and joint pain.  Skin: Negative for rash.  Neurological: Positive for weakness. Negative for sensory change, speech change, focal weakness and headaches.  Endo/Heme/Allergies: Does not bruise/bleed easily.  Psychiatric/Behavioral: Negative for depression. The patient is not nervous/anxious.       DRUG ALLERGIES:   Allergies  Allergen Reactions  . Erythromycin Base Other (See Comments)    Reaction:  Fever   . Rosiglitazone Maleate Swelling  . Codeine Sulfate Rash  . Tetanus-Diphtheria Toxoids Td Rash and Other (See Comments)    Reaction:  Fever     VITALS:  Blood pressure 128/64, pulse 79, temperature 98 F (36.7 C), temperature source Oral, resp. rate 18, height 5' 3"  (1.6 m), weight 68.13 kg (150 lb 3.2 oz), SpO2 94 %.  PHYSICAL EXAMINATION:   Physical Exam  GENERAL:  59 y.o.-year-old patient lying in the bed. EYES: Pupils equal, round, reactive to light and accommodation. No  scleral icterus. Extraocular muscles intact.  HEENT: Head atraumatic, normocephalic. Oropharynx and nasopharynx clear.  NECK:  Supple, no jugular venous distention. No thyroid enlargement, no tenderness.  LUNGS: Normal breath sounds bilaterally, no wheezing, rales, rhonchi. No use of accessory muscles of respiration.  CARDIOVASCULAR: S1, S2 normal. No murmurs, rubs, or gallops.  ABDOMEN: Soft, nondistended, painful to palpation in the upper abdomen, but no rebound or guarding. Bowel sounds diminished. No organomegaly or mass. Diffuse tenderness EXTREMITIES: No cyanosis, clubbing or edema b/l.    NEUROLOGIC: Cranial nerves II through XII are intact. No focal Motor or sensory deficits b/l.   PSYCHIATRIC: The patient is alert and oriented x 3.  SKIN: No obvious rash, lesion, or ulcer.    LABORATORY PANEL:   CBC  Recent Labs Lab 08/03/15 0518  WBC 4.6  HGB 10.0*  HCT 31.0*  PLT 105*   ------------------------------------------------------------------------------------------------------------------  Chemistries   Recent Labs Lab 08/01/15 0115  08/03/15 0518  NA  --   < > 137  K  --   < > 4.0  CL  --   < > 107  CO2  --   < > 26  GLUCOSE  --   < > 117*  BUN  --   < > 11  CREATININE  --   < > 0.87  CALCIUM  --   < > 8.0*  MG 1.9  --   --   AST  --   < > 18  ALT  --   < > 19  ALKPHOS  --   < > 67  BILITOT  --   < >  0.6  < > = values in this interval not displayed. ------------------------------------------------------------------------------------------------------------------  Cardiac Enzymes No results for input(s): TROPONINI in the last 168 hours. ------------------------------------------------------------------------------------------------------------------  RADIOLOGY:  Dg Abd 1 View  08/04/2015  CLINICAL DATA:  Small bowel obstruction. Generalized abdominal pain. EXAM: ABDOMEN - 1 VIEW COMPARISON:  Abdominal plain films dated 08/02/2015. Comparison also to CT  abdomen and pelvis dated 07/31/2015. FINDINGS: Overall bowel gas pattern is nonobstructive. Oral contrast administered for the earlier CT has progressed to the distal colon. Fairly large amount of stool is seen within the right colon. No dilated bowel loops. No evidence of soft tissue mass or localized fluid collection. No evidence of free intraperitoneal air. Cholecystectomy clips noted within the right upper quadrant. Patchy opacities at the right lung base compatible with the atelectasis seen on earlier CT. Degenerative changes again noted within the scoliotic thoracolumbar spine. No acute osseous abnormality seen. IMPRESSION: Nonobstructive bowel gas pattern and no evidence of acute intra-abdominal abnormality. Electronically Signed   By: Franki Cabot M.D.   On: 08/04/2015 08:33   Dg Abd 1 View  08/02/2015  CLINICAL DATA:  Evaluation of abdominal discomfort.  Distention. EXAM: ABDOMEN - 1 VIEW COMPARISON:  07/31/2015 FINDINGS: Gaseous distention of the small or large bowel loops appear improved from previous exam. Enteric contrast material is identified within the colon. IMPRESSION: 1. Findings compatible with improving bowel obstruction. Electronically Signed   By: Kerby Moors M.D.   On: 08/02/2015 15:47     ASSESSMENT AND PLAN:   This is a 59 year old Caucasian female admitted for abdominal pain secondary to ascites and suspected partial small bowel obstruction, improved clinically.  # Hypotension Likely from large volume paracentesis. Improved on IV fluid bolus, . IV albumin. No signs of infection.  # Ascites, paracentesis showed transudate. No significant WBC or protein. Due to cirrhosis. Patient was started on Lasix and Aldactone but will hold due to hypotension and nothing by mouth status. GI on board, input is appreciated.  # . Upper abdominal pain, thought to be due to partial small bowel obstruction, surgery, however, does not feel it is a small bowel obstruction , resume diet  with clear liquids , to be advanced as tolerated , discontinue IV fluids. Appreciate surgical input  # A. fib: Rate controlled Presently normal sinus rhythm with bradycardia. Continue Eliquis Amiodarone held due to cirrhosis and hypothyroidism. Cardiology saw patient in consultation and recommended Eliquis  #Hypothyroidism Continue IV levothyroxine for now, heart rate is improving. .  # Essential hypertension: Holding lisinopril and metoprolol for now, resume when hypertensive  # Diabetes mellitus type 2: Continue basal insulin. Sliding scale insulin, blood glucose is ranging between 80-170  # Hyperlipidemia: Continue statin  # Depression: Continue Celexa   All the records are reviewed and case discussed with Care Management/Social Workerr. Management plans discussed with the patient, family and they are in agreement.  CODE STATUS: FULL  DVT Prophylaxis: SCDs  TOTAL TIME TAKING CARE OF THIS PATIENT: 40 minutes.   POSSIBLE D/C IN 2-3 DAYS, DEPENDING ON CLINICAL CONDITION.   Theodoro Grist M.D on 08/04/2015 at 12:21 PM  Between 7am to 6pm - Pager - [phone]  After 6pm go to [email or web address] - password [email or web address] [facility]  Office  [phone]  CC: Primary care physician; Sheral Flow, NP    Note: This dictation was prepared with Dragon dictation along with smaller phrase technology. Any transcriptional errors that result from this process are unintentional.

## 2015-08-05 LAB — BODY FLUID CULTURE: Culture: NO GROWTH

## 2015-08-05 LAB — GLUCOSE, CAPILLARY
Glucose-Capillary: 183 mg/dL — ABNORMAL HIGH (ref 65–99)
Glucose-Capillary: 242 mg/dL — ABNORMAL HIGH (ref 65–99)
Glucose-Capillary: 172 mg/dL — ABNORMAL HIGH (ref 65–99)
Glucose-Capillary: 202 mg/dL — ABNORMAL HIGH (ref 65–99)

## 2015-08-05 MED ORDER — METOPROLOL SUCCINATE ER 25 MG PO TB24
25.0000 mg | ORAL_TABLET | Freq: Every day | ORAL | Status: DC
  Administered 2015-08-05 – 2015-08-06 (×2): 25 mg via ORAL
  Filled 2015-08-05 (×2): qty 1

## 2015-08-05 MED ORDER — LEVOTHYROXINE SODIUM 75 MCG PO TABS
75.0000 ug | ORAL_TABLET | Freq: Every day | ORAL | Status: DC
  Administered 2015-08-06: 75 ug via ORAL
  Filled 2015-08-05: qty 1

## 2015-08-05 MED ORDER — LISINOPRIL 5 MG PO TABS
5.0000 mg | ORAL_TABLET | Freq: Two times a day (BID) | ORAL | Status: DC
  Administered 2015-08-05 – 2015-08-06 (×3): 5 mg via ORAL
  Filled 2015-08-05 (×3): qty 1

## 2015-08-05 NOTE — Care Management Important Message (Signed)
Important Message  Patient Details  Name: [name] MRN: [id number] Date of Birth: Feb 26, 1956   Medicare Important Message Given:  Yes    Jolly Mango, RN 08/05/2015, 12:44 PM

## 2015-08-05 NOTE — Progress Notes (Signed)
[facility] NAME: [name]    MR#:  [id number]  DATE OF BIRTH:  [birth year]  SUBJECTIVE:  CHIEF COMPLAINT:   Chief Complaint  Patient presents with  . Abdominal Pain  . Diarrhea   abdominal pain is persistent but improved overall and mostly concentrated in the upper abdomen . Diet is being advanced . No nausea or vomiting  REVIEW OF SYSTEMS:    Review of Systems  Constitutional: Positive for malaise/fatigue. Negative for fever and chills.  HENT: Negative for sore throat.   Eyes: Negative for blurred vision, double vision and pain.  Respiratory: Negative for cough, hemoptysis, shortness of breath and wheezing.   Cardiovascular: Negative for chest pain, palpitations, orthopnea and leg swelling.  Gastrointestinal: Positive for nausea and abdominal pain. Negative for heartburn, vomiting, diarrhea and constipation.  Genitourinary: Negative for dysuria and hematuria.  Musculoskeletal: Negative for back pain and joint pain.  Skin: Negative for rash.  Neurological: Positive for weakness. Negative for sensory change, speech change, focal weakness and headaches.  Endo/Heme/Allergies: Does not bruise/bleed easily.  Psychiatric/Behavioral: Negative for depression. The patient is not nervous/anxious.       DRUG ALLERGIES:   Allergies  Allergen Reactions  . Erythromycin Base Other (See Comments)    Reaction:  Fever   . Rosiglitazone Maleate Swelling  . Codeine Sulfate Rash  . Tetanus-Diphtheria Toxoids Td Rash and Other (See Comments)    Reaction:  Fever     VITALS:  Blood pressure 173/82, pulse 90, temperature 98 F (36.7 C), temperature source Oral, resp. rate 20, height 5' 3"  (1.6 m), weight 68.13 kg (150 lb 3.2 oz), SpO2 99 %.  PHYSICAL EXAMINATION:   Physical Exam  GENERAL:  59 y.o.-year-old patient lying in the bed. EYES: Pupils equal, round, reactive to light and accommodation. No scleral icterus.  Extraocular muscles intact.  HEENT: Head atraumatic, normocephalic. Oropharynx and nasopharynx clear.  NECK:  Supple, no jugular venous distention. No thyroid enlargement, no tenderness.  LUNGS: Normal breath sounds bilaterally, no wheezing, rales, rhonchi. No use of accessory muscles of respiration.  CARDIOVASCULAR: S1, S2 normal. No murmurs, rubs, or gallops.  ABDOMEN: Soft, nondistended, painful to palpation in the upper abdomen, but no rebound or guarding. Bowel sounds diminished. No organomegaly or mass. Diffuse tenderness EXTREMITIES: No cyanosis, clubbing or edema b/l.    NEUROLOGIC: Cranial nerves II through XII are intact. No focal Motor or sensory deficits b/l.   PSYCHIATRIC: The patient is alert and oriented x 3.  SKIN: No obvious rash, lesion, or ulcer.    LABORATORY PANEL:   CBC  Recent Labs Lab 08/03/15 0518  WBC 4.6  HGB 10.0*  HCT 31.0*  PLT 105*   ------------------------------------------------------------------------------------------------------------------  Chemistries   Recent Labs Lab 08/01/15 0115  08/03/15 0518  NA  --   < > 137  K  --   < > 4.0  CL  --   < > 107  CO2  --   < > 26  GLUCOSE  --   < > 117*  BUN  --   < > 11  CREATININE  --   < > 0.87  CALCIUM  --   < > 8.0*  MG 1.9  --   --   AST  --   < > 18  ALT  --   < > 19  ALKPHOS  --   < > 67  BILITOT  --   < > 0.6  < > =  values in this interval not displayed. ------------------------------------------------------------------------------------------------------------------  Cardiac Enzymes No results for input(s): TROPONINI in the last 168 hours. ------------------------------------------------------------------------------------------------------------------  RADIOLOGY:  Dg Abd 1 View  08/04/2015  CLINICAL DATA:  Small bowel obstruction. Generalized abdominal pain. EXAM: ABDOMEN - 1 VIEW COMPARISON:  Abdominal plain films dated 08/02/2015. Comparison also to CT abdomen and pelvis  dated 07/31/2015. FINDINGS: Overall bowel gas pattern is nonobstructive. Oral contrast administered for the earlier CT has progressed to the distal colon. Fairly large amount of stool is seen within the right colon. No dilated bowel loops. No evidence of soft tissue mass or localized fluid collection. No evidence of free intraperitoneal air. Cholecystectomy clips noted within the right upper quadrant. Patchy opacities at the right lung base compatible with the atelectasis seen on earlier CT. Degenerative changes again noted within the scoliotic thoracolumbar spine. No acute osseous abnormality seen. IMPRESSION: Nonobstructive bowel gas pattern and no evidence of acute intra-abdominal abnormality. Electronically Signed   By: Franki Cabot M.D.   On: 08/04/2015 08:33     ASSESSMENT AND PLAN:   This is a 59 year old Caucasian female admitted for abdominal pain secondary to ascites and suspected partial small bowel obstruction, improved clinically.  # Hypotension, now  hypertensive  Likely from large volume paracentesis. Improved on IV fluid bolus, . IV albumin. No signs of infection.  # Ascites, paracentesis showed transudate. No significant WBC or protein. Due to cirrhosis. Patient was started on Lasix and Aldactone but was held due to hypotension and nothing by mouth status.Resume  GI on board, input is appreciated.  # . Upper abdominal pain, thought to be due to partial small bowel obstruction, surgery, however, does not feel it is a small bowel obstruction ,events diet, possible discharge home tomorrow if there is diet well   # A. fib: Rate controlled Presently normal sinus rhythm with bradycardia. Continue Eliquis Amiodarone held due to cirrhosis and hypothyroidism. Cardiology saw patient in consultation and recommended Eliquis. Resuming metoprolol  #Hypothyroidism Continue levothyroxine , changed to oral, follow with metoprolol .  # Essential hypertension:Resume lisinopril  and  metoprolol   # Diabetes mellitus type 2: Continue basal insulin. Sliding scale insulin, blood glucose is ranging between 80- 240   # Hyperlipidemia: Continue statin  # Depression: Continue Celexa   All the records are reviewed and case discussed with Care Management/Social Workerr. Management plans discussed with the patient, family and they are in agreement.  CODE STATUS: FULL  DVT Prophylaxis: SCDs  TOTAL TIME TAKING CARE OF THIS PATIENT: 40 minutes.   POSSIBLE D/C IN 2-3 DAYS, DEPENDING ON CLINICAL CONDITION.   Theodoro Grist M.D on 08/05/2015 at 2:45 PM  Between 7am to 6pm - Pager - [phone]  After 6pm go to [email or web address] - password [email or web address] [facility]  Office  [phone]  CC: Primary care physician; Sheral Flow, NP    Note: This dictation was prepared with Dragon dictation along with smaller phrase technology. Any transcriptional errors that result from this process are unintentional.

## 2015-08-06 DIAGNOSIS — I959 Hypotension, unspecified: Secondary | ICD-10-CM

## 2015-08-06 LAB — CBC
HCT: 32.2 % — ABNORMAL LOW (ref 35.0–47.0)
Hemoglobin: 10.5 g/dL — ABNORMAL LOW (ref 12.0–16.0)
MCH: 31.7 pg (ref 26.0–34.0)
MCHC: 32.8 g/dL (ref 32.0–36.0)
MCV: 96.6 fL (ref 80.0–100.0)
Platelets: 104 10*3/uL — ABNORMAL LOW (ref 150–440)
RBC: 3.33 MIL/uL — ABNORMAL LOW (ref 3.80–5.20)
RDW: 15.6 % — AB (ref 11.5–14.5)
WBC: 4.6 10*3/uL (ref 3.6–11.0)

## 2015-08-06 LAB — GLUCOSE, CAPILLARY
Glucose-Capillary: 128 mg/dL — ABNORMAL HIGH (ref 65–99)
Glucose-Capillary: 135 mg/dL — ABNORMAL HIGH (ref 65–99)

## 2015-08-06 MED ORDER — LEVOTHYROXINE SODIUM 75 MCG PO TABS: 75.0000 ug | ORAL_TABLET | Freq: Every day | ORAL | Status: DC

## 2015-08-06 NOTE — Care Management Note (Signed)
Case Management Note  Patient Details  Name: [name] MRN: [id number] Date of Birth: [birth year]  Subjective/Objective:                 Received a phone call from Dr Ether Griffins reporting that she was discontinuing all the home health orders.    Expected Discharge Date:                  Expected Discharge Plan:     In-House Referral:     Discharge planning Services     Post Acute Care Choice:    Choice offered to:     DME Arranged:    DME Agency:     HH Arranged:    [place] Agency:     Status of Service:     Medicare Important Message Given:  Yes Date Medicare IM Given:    Medicare IM give by:    Date Additional Medicare IM Given:    Additional Medicare Important Message give by:     If discussed at [place] of Stay Meetings, dates discussed:    Additional Comments:  [name] A, RN 08/06/2015, 1:41 PM

## 2015-08-06 NOTE — Progress Notes (Signed)
08/06/2015  14:30  [name] to be D/C'd Home per MD order.  Discussed prescriptions and follow up appointments with the patient. Prescriptions given to patient, medication list explained in detail. Pt verbalized understanding.    Medication List    STOP taking these medications        midodrine 10 MG tablet  Commonly known as:  PROAMATINE     promethazine 25 MG tablet  Commonly known as:  PHENERGAN      TAKE these medications        amiodarone 200 MG tablet  Commonly known as:  PACERONE  Take 1 tablet (200 mg total) by mouth daily.     apixaban 5 MG Tabs tablet  Commonly known as:  ELIQUIS  Take 1 tablet (5 mg total) by mouth 2 (two) times daily.     atorvastatin 40 MG tablet  Commonly known as:  LIPITOR  Take 1 tablet (40 mg total) by mouth daily at 6 PM.     citalopram 20 MG tablet  Commonly known as:  CELEXA  Take 20 mg by mouth daily.     dicyclomine 10 MG capsule  Commonly known as:  BENTYL  Take 1 capsule by mouth 4 (four) times daily - after meals and at bedtime.     insulin detemir 100 UNIT/ML injection  Commonly known as:  LEVEMIR  Inject 0.17 mLs (17 Units total) into the skin daily.     levothyroxine 75 MCG tablet  Commonly known as:  SYNTHROID, LEVOTHROID  Take 1 tablet (75 mcg total) by mouth daily before breakfast.     lisinopril 10 MG tablet  Commonly known as:  PRINIVIL,ZESTRIL  Take 0.5 tablets (5 mg total) by mouth 2 (two) times daily.     metoprolol succinate 25 MG 24 hr tablet  Commonly known as:  TOPROL-XL  Take 1 tablet (25 mg total) by mouth daily.     morphine 60 MG 12 hr tablet  Commonly known as:  MS CONTIN  Take 1 tablet (60 mg total) by mouth 2 (two) times daily.     pantoprazole 40 MG tablet  Commonly known as:  PROTONIX  Take 1 tablet (40 mg total) by mouth 2 (two) times daily.     psyllium 58.6 % packet  Commonly known as:  METAMUCIL  Take 1 packet by mouth daily as needed (constipation).     ranitidine 150 MG  capsule  Commonly known as:  ZANTAC  Take 1 capsule (150 mg total) by mouth 2 (two) times daily.     rifaximin 550 MG Tabs tablet  Commonly known as:  XIFAXAN  Take 1 tablet (550 mg total) by mouth 2 (two) times daily.     tiZANidine 4 MG tablet  Commonly known as:  ZANAFLEX  Take 4 mg by mouth every 6 (six) hours as needed for muscle spasms.     zolpidem 10 MG tablet  Commonly known as:  AMBIEN  Take 10 mg by mouth at bedtime.        Filed Vitals:   08/06/15 0924 08/06/15 1312  BP: 144/74 121/69  Pulse: 67 76  Temp: 99.5 F (37.5 C) 99 F (37.2 C)  Resp:      Skin clean, dry and intact without evidence of skin break down, no evidence of skin tears noted. IV catheter discontinued intact. Site without signs and symptoms of complications. Dressing and pressure applied. Pt denies pain at this time. No complaints noted.  An After Visit  Summary was printed and given to the patient. Patient escorted via [place], and D/C home via private auto.  Dola Argyle

## 2015-08-06 NOTE — Discharge Summary (Signed)
[name] NAME: [name]    MR#:  [id number]  DATE OF BIRTH:  09/17/1955  DATE OF ADMISSION:  07/31/2015 ADMITTING PHYSICIAN: Harrie Foreman, MD  DATE OF DISCHARGE: 08/06/2015  2:41 PM  PRIMARY CARE PHYSICIAN: Sheral Flow, NP     ADMISSION DIAGNOSIS:  Abdominal pain, unspecified abdominal location [R10.9]  DISCHARGE DIAGNOSIS:  Principal Problem:   Ileus ([place]) Active Problems:   Abdominal pain   Ascites   Hypotension   SECONDARY DIAGNOSIS:   Past Medical History  Diagnosis Date  . Type 1 diabetes ([place])   . Hypothyroid   . Degenerative disk disease   . Stomach ulcer   . Diverticulitis   . Syncope 01/2015  . Anxiety   . GERD (gastroesophageal reflux disease)   . History of hiatal hernia   . Cancer ([facility])     HX OF CANCER OF UTERUS   . TIA (transient ischemic attack) 02/2015  . Hypertension   . Pancreatitis   . PAF (paroxysmal atrial fibrillation) ([place]) 03/2015    a. new onset 03/2015 in setting of intractable N/V; b. on Eliquis 5 mg bid; c. CHADSVASc 4 (DM, TIA x 2, female)  . Intussusception intestine ([place]) 05/2015    .pro HOSPITAL COURSE:   The patient is 59 year old female who was recently admitted for intractable nausea and vomiting. Upper abdominal pain comes back to the hospital with recurrence of pain, nausea and vomiting.  Xray of chest and abdomen showed right lung base pneumonia and small bowel with air fluid levels, concerning for partial obstruction. CT scan of abdomen small bilateral pleural effusions with infiltration or atelectasis in the lung bases, changes of hepatic cirrhosis with prominent diffuse abdominal ascites, increasing since previous study. Proximal small bowel distention with transition zone to decompressed bowel in the left mid abdomen,  consistent with partial obstruction. Filling defect in the cardia/lesser curvature of the stomach again demonstrated. The  patient underwent 3.25.liter US guided paracentesis 11.21.16, showing no SBP.  Patient was admitted to the hospital, placed in NPO status, she  was seen and followed  by gastroenterologist  while in the hospital. She was also evaluated by surgeon, who also dis not feel that she had bowel obstruction and supportive therapy only was recommended. Post paracetesis her blood pressure plummeted, requiring IVF bolus, IV albumin. With conservative therapy, however,  her condition improved and her diet was advanced. She was felt to be stable to be discharged to home with gastroenterology follow up as outpatient for small bowel follow through. Discussion by problem:  # Ascites, paracentesis showed transudate. No significant WBC or protein. Due to cirrhosis. Patient was started on Lasix and Aldactone but was held due to hypotension and nothing by mouth status. Resume as outpatient.  Follow up with GI as outpatient.  # Hypotension,  likely from large volume paracentesis. Improved on IV fluid, . IV albumin. No signs of infection.  # . Upper abdominal pain, thought to be due to partial small bowel obstruction, GI and surgery, however, did not feel it was a small bowel obstruction , diet was advanced, patient was felt to be stable to discharge home. She may benefit from small bowel follow through.   # A. fib: Rate controlled. Presently in normal sinus rhythm with bradycardia. Continue Eliquis, may need to hold it if GI decides on procedure as outpatient,  Amiodarone was held due to cirrhosis and hypothyroidism. Cardiology saw patient in consultation  and recommended to continue Eliquis, metoprolol  #Hypothyroidism Continue levothyroxine , follow TSH as outpatient.  .  # Essential hypertension:Resume lisinopril and metoprolol   # Diabetes mellitus type 2: Continue basal insulin at home doses. Blood glucose was ranging between 80- 240   # Hyperlipidemia: Continue statin  # Depression: Continue  Celexa   DISCHARGE CONDITIONS:   staBLE  CONSULTS OBTAINED:  Treatment Team:  Abby Percell Locus, MD Lucilla Lame, MD Corey Skains, MD Isaias Cowman, MD  DRUG ALLERGIES:   Allergies  Allergen Reactions  . Erythromycin Base Other (See Comments)    Reaction:  Fever   . Rosiglitazone Maleate Swelling  . Codeine Sulfate Rash  . Tetanus-Diphtheria Toxoids Td Rash and Other (See Comments)    Reaction:  Fever     DISCHARGE MEDICATIONS:   Discharge Medication List as of 08/06/2015  1:50 PM    START taking these medications   Details  levothyroxine (SYNTHROID, LEVOTHROID) 75 MCG tablet Take 1 tablet (75 mcg total) by mouth daily before breakfast., Starting 08/06/2015, Until Discontinued, Normal      CONTINUE these medications which have NOT CHANGED   Details  amiodarone (PACERONE) 200 MG tablet Take 1 tablet (200 mg total) by mouth daily., Starting 05/31/2015, Until Discontinued, Print    apixaban (ELIQUIS) 5 MG TABS tablet Take 1 tablet (5 mg total) by mouth 2 (two) times daily., Starting 04/07/2015, Until Discontinued, Normal    atorvastatin (LIPITOR) 40 MG tablet Take 1 tablet (40 mg total) by mouth daily at 6 PM., Starting 02/17/2015, Until Discontinued, Normal    citalopram (CELEXA) 20 MG tablet Take 20 mg by mouth daily., Until Discontinued, Historical Med    dicyclomine (BENTYL) 10 MG capsule Take 1 capsule by mouth 4 (four) times daily - after meals and at bedtime. , Starting 03/11/2015, Until Discontinued, Historical Med    insulin detemir (LEVEMIR) 100 UNIT/ML injection Inject 0.17 mLs (17 Units total) into the skin daily., Starting 07/11/2015, Until Discontinued, Normal    lisinopril (PRINIVIL,ZESTRIL) 10 MG tablet Take 0.5 tablets (5 mg total) by mouth 2 (two) times daily., Starting 07/11/2015, Until Discontinued, Normal    metoprolol succinate (TOPROL-XL) 25 MG 24 hr tablet Take 1 tablet (25 mg total) by mouth daily., Starting 06/14/2015, Until Discontinued,  No Print    morphine (MS CONTIN) 60 MG 12 hr tablet Take 1 tablet (60 mg total) by mouth 2 (two) times daily., Starting 07/11/2015, Until Discontinued, Print    pantoprazole (PROTONIX) 40 MG tablet Take 1 tablet (40 mg total) by mouth 2 (two) times daily., Starting 04/07/2015, Until Discontinued, Normal    ranitidine (ZANTAC) 150 MG capsule Take 1 capsule (150 mg total) by mouth 2 (two) times daily., Starting 07/03/2015, Until Discontinued, Print    rifaximin (XIFAXAN) 550 MG TABS tablet Take 1 tablet (550 mg total) by mouth 2 (two) times daily., Starting 07/11/2015, Until Discontinued, Normal    tiZANidine (ZANAFLEX) 4 MG tablet Take 4 mg by mouth every 6 (six) hours as needed for muscle spasms. , Until Discontinued, Historical Med    zolpidem (AMBIEN) 10 MG tablet Take 10 mg by mouth at bedtime., Until Discontinued, Historical Med    psyllium (METAMUCIL) 58.6 % packet Take 1 packet by mouth daily as needed (constipation). , Until Discontinued, Historical Med      STOP taking these medications     midodrine (PROAMATINE) 10 MG tablet      promethazine (PHENERGAN) 25 MG tablet      promethazine (PHENERGAN)  25 MG tablet      sucralfate (CARAFATE) 1 G tablet          DISCHARGE INSTRUCTIONS:    FOLLOW UP WITH pcp, GASTROENTEROLOGY, CARDIOLOGY AS OUTPATIENT  If you experience worsening of your admission symptoms, develop shortness of breath, life threatening emergency, suicidal or homicidal thoughts you must seek medical attention immediately by calling 911 or calling your MD immediately  if symptoms less severe.  You Must read complete instructions/literature along with all the possible adverse reactions/side effects for all the Medicines you take and that have been prescribed to you. Take any new Medicines after you have completely understood and accept all the possible adverse reactions/side effects.   Please note  You were cared for by a hospitalist during your hospital stay.  If you have any questions about your discharge medications or the care you received while you were in the hospital after you are discharged, you can call the unit and asked to speak with the hospitalist on call if the hospitalist that took care of you is not available. Once you are discharged, your primary care physician will handle any further medical issues. Please note that NO REFILLS for any discharge medications will be authorized once you are discharged, as it is imperative that you return to your primary care physician (or establish a relationship with a primary care physician if you do not have one) for your aftercare needs so that they can reassess your need for medications and monitor your lab values.    Today   CHIEF COMPLAINT:   Chief Complaint  Patient presents with  . Abdominal Pain  . Diarrhea    HISTORY OF PRESENT ILLNESS:  [name]  is a 59 y.o. female with a known history of recent admiSSION for intractable nausea and vomiting, upper abdominal pain comes back to the hospital with recurrence of pain, nausea and vomiting.  Xray of chest and abdomen showed right lung base pneumonia and small bowel with air fluid levels, concerning for partial obstruction. CT scan of abdomen small bilateral pleural effusions with infiltration or atelectasis in the lung bases, changes of hepatic cirrhosis with prominent diffuse abdominal ascites, increasing since previous study. Proximal small bowel distention with transition zone to decompressed bowel in the left mid abdomen,  consistent with partial obstruction. Filling defect in the cardia/lesser curvature of the stomach again demonstrated. The patient underwent 3.25.liter US guided paracentesis 11.21.16, showing no SBP.  Patient was admitted to the hospital, placed in NPO status, she  was seen and followed  by gastroenterologist  while in the hospital. She was also evaluated by surgeon, who also dis not feel that she had bowel obstruction and  supportive therapy only was recommended. Post paracetesis her blood pressure plummeted, requiring IVF bolus, IV albumin. With conservative therapy, however,  her condition improved and her diet was advanced. She was felt to be stable to be discharged to home with gastroenterology follow up as outpatient for small bowel follow through. Discussion by problem:  # Ascites, paracentesis showed transudate. No significant WBC or protein. Due to cirrhosis. Patient was started on Lasix and Aldactone but was held due to hypotension and nothing by mouth status. Resume as outpatient.  Follow up with GI as outpatient.  # Hypotension,  likely from large volume paracentesis. Improved on IV fluid, . IV albumin. No signs of infection.  # . Upper abdominal pain, thought to be due to partial small bowel obstruction, GI and surgery, however, did not feel it  was a small bowel obstruction , diet was advanced, patient was felt to be stable to discharge home. She may benefit from small bowel follow through.   # A. fib: Rate controlled. Presently in normal sinus rhythm with bradycardia. Continue Eliquis, may need to hold it if GI decides on procedure as outpatient,  Amiodarone was held due to cirrhosis and hypothyroidism. Cardiology saw patient in consultation and recommended to continue Eliquis, metoprolol  #Hypothyroidism Continue levothyroxine , follow TSH as outpatient.  .  # Essential hypertension:Resume lisinopril and metoprolol   # Diabetes mellitus type 2: Continue basal insulin at home doses. Blood glucose was ranging between 80- 240   # Hyperlipidemia: Continue statin  # Depression: Continue Celexa    VITAL SIGNS:  Blood pressure 121/69, pulse 76, temperature 99 F (37.2 C), temperature source Oral, resp. rate 18, height 5' 3"  (1.6 m), weight 68.493 kg (151 lb), SpO2 96 %.  I/O:   Intake/Output Summary (Last 24 hours) at 08/06/15 1717 Last data filed at 08/06/15 1200  Gross per 24 hour   Intake 1329.33 ml  Output   1100 ml  Net 229.33 ml    PHYSICAL EXAMINATION:  GENERAL:  59 y.o.-year-old patient lying in the bed with no acute distress.  EYES: Pupils equal, round, reactive to light and accommodation. No scleral icterus. Extraocular muscles intact.  HEENT: Head atraumatic, normocephalic. Oropharynx and nasopharynx clear.  NECK:  Supple, no jugular venous distention. No thyroid enlargement, no tenderness.  LUNGS: Normal breath sounds bilaterally, no wheezing, rales,rhonchi or crepitation. No use of accessory muscles of respiration.  CARDIOVASCULAR: S1, S2 normal. No murmurs, rubs, or gallops.  ABDOMEN: Soft, non-tender, non-distended. Bowel sounds present. No organomegaly or mass.  EXTREMITIES: No pedal edema, cyanosis, or clubbing.  NEUROLOGIC: Cranial nerves II through XII are intact. Muscle strength 5/5 in all extremities. Sensation intact. Gait not checked.  PSYCHIATRIC: The patient is alert and oriented x 3.  SKIN: No obvious rash, lesion, or ulcer.   DATA REVIEW:   CBC  Recent Labs Lab 08/06/15 0504  WBC 4.6  HGB 10.5*  HCT 32.2*  PLT 104*    Chemistries   Recent Labs Lab 08/01/15 0115  08/03/15 0518  NA  --   < > 137  K  --   < > 4.0  CL  --   < > 107  CO2  --   < > 26  GLUCOSE  --   < > 117*  BUN  --   < > 11  CREATININE  --   < > 0.87  CALCIUM  --   < > 8.0*  MG 1.9  --   --   AST  --   < > 18  ALT  --   < > 19  ALKPHOS  --   < > 67  BILITOT  --   < > 0.6  < > = values in this interval not displayed.  Cardiac Enzymes No results for input(s): TROPONINI in the last 168 hours.  Microbiology Results  Results for orders placed or performed during the hospital encounter of 07/31/15  Body fluid culture     Status: None   Collection Time: 08/01/15  9:18 AM  Result Value Ref Range Status   Specimen Description PERITONEAL  Final   Special Requests NONE  Final   Gram Stain FEW WBC SEEN NO ORGANISMS SEEN   Final   Culture No growth  aerobically or anaerobically.  Final   Report Status  08/05/2015 FINAL  Final    RADIOLOGY:  No results found.  EKG:   Orders placed or performed during the hospital encounter of 07/31/15  . EKG 12-Lead  . EKG 12-Lead      Management plans discussed with the patient, family and they are in agreement.  CODE STATUS:     Code Status Orders        Start     Ordered   08/01/15 0126  Full code   Continuous     08/01/15 0125    Advance Directive Documentation        Most Recent Value   Type of Advance Directive  Healthcare Power of Attorney, Living will   Pre-existing out of facility DNR order (yellow form or pink MOST form)     "MOST" Form in Place?        TOTAL TIME TAKING CARE OF THIS PATIENT: 40 minutes.    Theodoro Grist M.D on 08/06/2015 at 5:17 PM  Between 7am to 6pm - Pager - [phone]  After 6pm go to [email or web address] - password [email or web address] [facility]  [place] [facility]  Office  [phone]  CC: Primary care physician; Sheral Flow, NP

## 2015-08-09 ENCOUNTER — Telehealth: Payer: Self-pay | Admitting: *Deleted

## 2015-08-09 NOTE — Telephone Encounter (Signed)
Transitional care call attempted.  Left message with patient's husband for patient to return call.

## 2015-08-10 NOTE — Telephone Encounter (Signed)
Transitional care call attempted.  No answer, no voice mail to leave a message.  Will continue attempts to contact patient.

## 2015-08-11 NOTE — Telephone Encounter (Signed)
Transition Care Management Follow-up Telephone Call   Date discharged? 08/06/15   How have you been since you were released from the hospital? Weak, improving   Do you understand why you were in the hospital? yes   Do you understand the discharge instructions? yes   Where were you discharged to? Home   Items Reviewed:  Medications reviewed: yes  Allergies reviewed: yes  Dietary changes reviewed: no  Referrals reviewed: yes, GI/cardiology   Functional Questionnaire:   Activities of Daily Living (ADLs):   She states they are independent in the following: ambulation, bathing and hygiene, feeding, continence, grooming, toileting and dressing States they require assistance with the following: None   Any transportation issues/concerns?: no   Any patient concerns? no   Confirmed importance and date/time of follow-up visits scheduled yes, spoke with patient's husband who will have patient call back to schedule f/u   Confirmed with patient if condition begins to worsen call PCP or go to the ER.  Patient was given the office number and encouraged to call back with question or concerns.  : yes

## 2015-08-14 ENCOUNTER — Emergency Department: Payer: Commercial Managed Care - HMO

## 2015-08-14 ENCOUNTER — Observation Stay
Admission: EM | Admit: 2015-08-14 | Discharge: 2015-08-16 | Disposition: A | Discharge status: Home / Self Care | Payer: Commercial Managed Care - HMO | Attending: Internal Medicine | Admitting: Internal Medicine

## 2015-08-14 DIAGNOSIS — Z9049 Acquired absence of other specified parts of digestive tract: Secondary | ICD-10-CM | POA: Diagnosis not present

## 2015-08-14 DIAGNOSIS — Z794 Long term (current) use of insulin: Secondary | ICD-10-CM | POA: Diagnosis not present

## 2015-08-14 DIAGNOSIS — K219 Gastro-esophageal reflux disease without esophagitis: Secondary | ICD-10-CM | POA: Insufficient documentation

## 2015-08-14 DIAGNOSIS — R188 Other ascites: Secondary | ICD-10-CM | POA: Diagnosis not present

## 2015-08-14 DIAGNOSIS — F329 Major depressive disorder, single episode, unspecified: Secondary | ICD-10-CM | POA: Diagnosis not present

## 2015-08-14 DIAGNOSIS — I8500 Esophageal varices without bleeding: Secondary | ICD-10-CM | POA: Diagnosis not present

## 2015-08-14 DIAGNOSIS — Z8673 Personal history of transient ischemic attack (TIA), and cerebral infarction without residual deficits: Secondary | ICD-10-CM | POA: Diagnosis not present

## 2015-08-14 DIAGNOSIS — I700 Atherosclerosis of aorta: Secondary | ICD-10-CM | POA: Insufficient documentation

## 2015-08-14 DIAGNOSIS — Z90710 Acquired absence of both cervix and uterus: Secondary | ICD-10-CM | POA: Insufficient documentation

## 2015-08-14 DIAGNOSIS — Z79899 Other long term (current) drug therapy: Secondary | ICD-10-CM | POA: Insufficient documentation

## 2015-08-14 DIAGNOSIS — Z7901 Long term (current) use of anticoagulants: Secondary | ICD-10-CM | POA: Diagnosis not present

## 2015-08-14 DIAGNOSIS — F419 Anxiety disorder, unspecified: Secondary | ICD-10-CM | POA: Diagnosis not present

## 2015-08-14 DIAGNOSIS — Z9889 Other specified postprocedural states: Secondary | ICD-10-CM | POA: Diagnosis not present

## 2015-08-14 DIAGNOSIS — M5126 Other intervertebral disc displacement, lumbar region: Secondary | ICD-10-CM | POA: Insufficient documentation

## 2015-08-14 DIAGNOSIS — E109 Type 1 diabetes mellitus without complications: Secondary | ICD-10-CM | POA: Insufficient documentation

## 2015-08-14 DIAGNOSIS — R1084 Generalized abdominal pain: Secondary | ICD-10-CM | POA: Diagnosis not present

## 2015-08-14 DIAGNOSIS — R630 Anorexia: Secondary | ICD-10-CM | POA: Insufficient documentation

## 2015-08-14 DIAGNOSIS — R1013 Epigastric pain: Secondary | ICD-10-CM

## 2015-08-14 DIAGNOSIS — I864 Gastric varices: Secondary | ICD-10-CM | POA: Diagnosis not present

## 2015-08-14 DIAGNOSIS — Z8542 Personal history of malignant neoplasm of other parts of uterus: Secondary | ICD-10-CM | POA: Diagnosis not present

## 2015-08-14 DIAGNOSIS — E039 Hypothyroidism, unspecified: Secondary | ICD-10-CM | POA: Insufficient documentation

## 2015-08-14 DIAGNOSIS — K2970 Gastritis, unspecified, without bleeding: Secondary | ICD-10-CM | POA: Diagnosis not present

## 2015-08-14 DIAGNOSIS — N200 Calculus of kidney: Secondary | ICD-10-CM | POA: Insufficient documentation

## 2015-08-14 DIAGNOSIS — R112 Nausea with vomiting, unspecified: Principal | ICD-10-CM | POA: Insufficient documentation

## 2015-08-14 DIAGNOSIS — Z8719 Personal history of other diseases of the digestive system: Secondary | ICD-10-CM | POA: Diagnosis not present

## 2015-08-14 DIAGNOSIS — K7460 Unspecified cirrhosis of liver: Secondary | ICD-10-CM | POA: Diagnosis not present

## 2015-08-14 DIAGNOSIS — K6389 Other specified diseases of intestine: Secondary | ICD-10-CM | POA: Diagnosis not present

## 2015-08-14 DIAGNOSIS — Z8249 Family history of ischemic heart disease and other diseases of the circulatory system: Secondary | ICD-10-CM | POA: Diagnosis not present

## 2015-08-14 DIAGNOSIS — I119 Hypertensive heart disease without heart failure: Secondary | ICD-10-CM | POA: Insufficient documentation

## 2015-08-14 DIAGNOSIS — R1111 Vomiting without nausea: Secondary | ICD-10-CM | POA: Diagnosis not present

## 2015-08-14 DIAGNOSIS — I480 Paroxysmal atrial fibrillation: Secondary | ICD-10-CM | POA: Insufficient documentation

## 2015-08-14 DIAGNOSIS — J9811 Atelectasis: Secondary | ICD-10-CM | POA: Insufficient documentation

## 2015-08-14 LAB — COMPREHENSIVE METABOLIC PANEL
ALT: 72 U/L — AB (ref 14–54)
AST: 57 U/L — ABNORMAL HIGH (ref 15–41)
Albumin: 3.5 g/dL (ref 3.5–5.0)
Alkaline Phosphatase: 118 U/L (ref 38–126)
Anion gap: 5 (ref 5–15)
BUN: 14 mg/dL (ref 6–20)
CHLORIDE: 107 mmol/L (ref 101–111)
CO2: 24 mmol/L (ref 22–32)
CREATININE: 0.57 mg/dL (ref 0.44–1.00)
Calcium: 8.7 mg/dL — ABNORMAL LOW (ref 8.9–10.3)
Glucose, Bld: 265 mg/dL — ABNORMAL HIGH (ref 65–99)
Potassium: 3.4 mmol/L — ABNORMAL LOW (ref 3.5–5.1)
SODIUM: 136 mmol/L (ref 135–145)
Total Bilirubin: 0.6 mg/dL (ref 0.3–1.2)
Total Protein: 6.4 g/dL — ABNORMAL LOW (ref 6.5–8.1)

## 2015-08-14 LAB — CBC WITH DIFFERENTIAL/PLATELET
BASOS ABS: 0 10*3/uL (ref 0–0.1)
Basophils Relative: 1 %
EOS ABS: 0 10*3/uL (ref 0–0.7)
EOS PCT: 0 %
HCT: 37.2 % (ref 35.0–47.0)
Hemoglobin: 11.9 g/dL — ABNORMAL LOW (ref 12.0–16.0)
LYMPHS ABS: 0.6 10*3/uL — AB (ref 1.0–3.6)
Lymphocytes Relative: 11 %
MCH: 30.3 pg (ref 26.0–34.0)
MCHC: 32 g/dL (ref 32.0–36.0)
MCV: 94.8 fL (ref 80.0–100.0)
MONO ABS: 0.3 10*3/uL (ref 0.2–0.9)
Monocytes Relative: 4 %
Neutro Abs: 4.9 10*3/uL (ref 1.4–6.5)
Neutrophils Relative %: 84 %
Platelets: 155 10*3/uL (ref 150–440)
RBC: 3.92 MIL/uL (ref 3.80–5.20)
RDW: 15 % — AB (ref 11.5–14.5)
WBC: 5.8 10*3/uL (ref 3.6–11.0)

## 2015-08-14 LAB — PROTIME-INR
INR: 1.1
PROTHROMBIN TIME: 14.4 s (ref 11.4–15.0)

## 2015-08-14 LAB — APTT: APTT: 29 s (ref 24–36)

## 2015-08-14 LAB — LIPASE, BLOOD: LIPASE: 32 U/L (ref 11–51)

## 2015-08-14 LAB — LACTIC ACID, PLASMA: LACTIC ACID, VENOUS: 2.3 mmol/L — AB (ref 0.5–2.0)

## 2015-08-14 MED ORDER — SODIUM CHLORIDE 0.9 % IV BOLUS (SEPSIS)
500.0000 mL | Freq: Once | INTRAVENOUS | Status: AC
  Administered 2015-08-14: 500 mL via INTRAVENOUS

## 2015-08-14 MED ORDER — IOHEXOL 240 MG/ML SOLN
25.0000 mL | Freq: Once | INTRAMUSCULAR | Status: AC | PRN
  Administered 2015-08-14: 25 mL via ORAL

## 2015-08-14 MED ORDER — HALOPERIDOL LACTATE 5 MG/ML IJ SOLN
5.0000 mg | Freq: Once | INTRAMUSCULAR | Status: AC
  Administered 2015-08-14: 5 mg via INTRAVENOUS
  Filled 2015-08-14: qty 1

## 2015-08-14 MED ORDER — ONDANSETRON HCL 4 MG PO TABS: 4.0000 mg | ORAL_TABLET | Freq: Every day | ORAL | Status: DC | PRN

## 2015-08-14 MED ORDER — OXYCODONE-ACETAMINOPHEN 5-325 MG PO TABS: 1.0000 | ORAL_TABLET | Freq: Four times a day (QID) | ORAL | Status: DC | PRN

## 2015-08-14 MED ORDER — PROMETHAZINE HCL 25 MG/ML IJ SOLN: 12.5000 mg | Freq: Once | INTRAMUSCULAR | Status: DC

## 2015-08-14 MED ORDER — OXYCODONE-ACETAMINOPHEN 5-325 MG PO TABS
1.0000 | ORAL_TABLET | Freq: Once | ORAL | Status: AC
  Administered 2015-08-15: 1 via ORAL
  Filled 2015-08-14: qty 1

## 2015-08-14 MED ORDER — IOHEXOL 300 MG/ML  SOLN
100.0000 mL | Freq: Once | INTRAMUSCULAR | Status: AC | PRN
  Administered 2015-08-14: 100 mL via INTRAVENOUS

## 2015-08-14 MED ORDER — HYDROMORPHONE HCL 1 MG/ML IJ SOLN: 0.5000 mg | Freq: Once | INTRAMUSCULAR | Status: DC

## 2015-08-14 NOTE — ED Notes (Signed)
Pt using call bell; rates epigastric pain 10/10; would like something for pain as well as nausea

## 2015-08-14 NOTE — ED Notes (Signed)
MD in to see pt while this nurse at bedside; MD aware pt is c/o pain and nausea; no new medications ordered at this time; pt aware

## 2015-08-14 NOTE — ED Notes (Signed)
Pt reports she takes medications for hypertension but has been vomiting everything she takes for the past 5 days bp 179/102

## 2015-08-14 NOTE — ED Provider Notes (Addendum)
[facility] Emergency Department Provider Note  ____________________________________________   I have reviewed the triage vital signs and the nursing notes.   HISTORY  Chief Complaint Abdominal Pain    HPI [name] is a 59 y.o. female very well-known to [name] for multiple prior visits mostly for abdominal pain. Patient has a history of Nissen complication years ago, esophageal dilatation, paroxysmal atrial fibrillation, pancreatitis, reflux disease, liver disease with ascites, reflux, ulcers, chronic abdominal pain, ileus, cirrhotic liver, diabetes mellitus, C. difficile, GI bleeds, malnutrition, episodes of intractable nausea and vomiting,who presents today complaining of abdominal pain and vomiting. Patient states she's been vomiting for a week. She has not had much to eat. She denies hematemesis of bright red blood per rectum. She has had no fevers or chills. She is not been able to take her other medications. She has not been able to get in to see her doctor for this. Similar prior episodes. She feels distended. She is requesting narcotic pain medication  Past Medical History  Diagnosis Date  . Type 1 diabetes ([place])   . Hypothyroid   . Degenerative disk disease   . Stomach ulcer   . Diverticulitis   . Syncope 01/2015  . Anxiety   . GERD (gastroesophageal reflux disease)   . History of hiatal hernia   . Cancer (HCC)     HX OF CANCER OF UTERUS   . TIA (transient ischemic attack) 02/2015  . Hypertension   . Pancreatitis   . PAF (paroxysmal atrial fibrillation) ([place]) 03/2015    a. new onset 03/2015 in setting of intractable N/V; b. on Eliquis 5 mg bid; c. CHADSVASc 4 (DM, TIA x 2, female)  . Intussusception intestine ([place]) 05/2015    Patient Active Problem List   Diagnosis Date Noted  . Hypotension 08/06/2015  . Ileus ([place]) 08/01/2015  . Abdominal pain 08/01/2015  . AP (abdominal pain)   . Ascites   . Encephalopathy, metabolic 48/18/5631  .  Liver cirrhosis ([place]) 07/10/2015  . Paroxysmal atrial fibrillation ([place]) 07/10/2015  . Diabetes mellitus ([place]) 07/10/2015  . C. difficile colitis 07/10/2015  . Generalized weakness 07/10/2015  . GI (gastrointestinal bleed) 07/06/2015  . Intractable nausea and vomiting 07/04/2015  . Malnutrition of moderate degree 07/04/2015  . Chest pain   . Symptomatic bradycardia 05/27/2015  . Acute encephalopathy 05/27/2015  . Elevated troponin 05/27/2015  . Intussusception intestine ([place])   . Other specified hypothyroidism   . Abdominal pain, chronic, epigastric   . Diarrhea 05/19/2015  . Chronic anemia 05/19/2015  . Thrombocytopenia ([place]) 05/19/2015  . ARF (acute renal failure) ([place]) 05/19/2015  . Prolonged QT interval   . Hypomagnesemia   . Colitis   . Narcotic abuse   . Type 2 diabetes mellitus without complication ([place])   . Syncope 05/18/2015  . PAF (paroxysmal atrial fibrillation) ([place])   . Demand ischemia ([place]) 04/06/2015  . Hypokalemia 04/06/2015  . Hyperlipidemia with target LDL less than 100 04/06/2015  . Protein-calorie malnutrition, severe ([place]) 04/05/2015  . CVA (cerebral infarction) 02/15/2015  . Dehydration 01/12/2015  . Essential hypertension 01/12/2015  . Orthostatic hypotension 01/12/2015  . Chronically on opiate therapy 01/12/2015  . Gastroparesis 01/12/2015  . DEPRESSION/ANXIETY 06/27/2007  . MYOFASCIAL PAIN SYNDROME 06/27/2007  . Chronic pain syndrome 03/28/2007  . GERD 03/27/2007  . DIVERTICULOSIS, COLON 03/27/2007  . LUMBAR DISC DISPLACEMENT 03/27/2007  . PROTEINURIA 03/27/2007  . UTERINE CANCER, HX OF 03/27/2007    Past Surgical History  Procedure Laterality  Date  . Hernia repair    . Abdominal hysterectomy    . Cholecystectomy    . Esophagogastroduodenoscopy N/A 04/04/2015    Procedure: ESOPHAGOGASTRODUODENOSCOPY (EGD);  Surgeon: Hulen Luster, MD;  Location: [facility] ENDOSCOPY;  Service: Endoscopy;  Laterality: N/A;    Current Outpatient Rx  Name  Route  Sig   Dispense  Refill  . amiodarone (PACERONE) 200 MG tablet   Oral   Take 1 tablet (200 mg total) by mouth daily.   30 tablet   0   . apixaban (ELIQUIS) 5 MG TABS tablet   Oral   Take 1 tablet (5 mg total) by mouth 2 (two) times daily.   60 tablet   0   . atorvastatin (LIPITOR) 40 MG tablet   Oral   Take 1 tablet (40 mg total) by mouth daily at 6 PM.   30 tablet   0   . citalopram (CELEXA) 20 MG tablet   Oral   Take 20 mg by mouth daily.         Marland Kitchen dicyclomine (BENTYL) 10 MG capsule   Oral   Take 1 capsule by mouth 4 (four) times daily - after meals and at bedtime.          . insulin detemir (LEVEMIR) 100 UNIT/ML injection   Subcutaneous   Inject 0.17 mLs (17 Units total) into the skin daily.   10 mL   11   . levothyroxine (SYNTHROID, LEVOTHROID) 75 MCG tablet   Oral   Take 1 tablet (75 mcg total) by mouth daily before breakfast.   30 tablet   5   . lisinopril (PRINIVIL,ZESTRIL) 10 MG tablet   Oral   Take 0.5 tablets (5 mg total) by mouth 2 (two) times daily.   60 tablet   6   . metoprolol succinate (TOPROL-XL) 25 MG 24 hr tablet   Oral   Take 1 tablet (25 mg total) by mouth daily.   30 tablet   0   . morphine (MS CONTIN) 60 MG 12 hr tablet   Oral   Take 1 tablet (60 mg total) by mouth 2 (two) times daily.   60 tablet   0   . pantoprazole (PROTONIX) 40 MG tablet   Oral   Take 1 tablet (40 mg total) by mouth 2 (two) times daily.   60 tablet   0   . psyllium (METAMUCIL) 58.6 % packet   Oral   Take 1 packet by mouth daily as needed (constipation).          . ranitidine (ZANTAC) 150 MG capsule   Oral   Take 1 capsule (150 mg total) by mouth 2 (two) times daily.   28 capsule   0   . rifaximin (XIFAXAN) 550 MG TABS tablet   Oral   Take 1 tablet (550 mg total) by mouth 2 (two) times daily.   60 tablet   6   . tiZANidine (ZANAFLEX) 4 MG tablet   Oral   Take 4 mg by mouth every 6 (six) hours as needed for muscle spasms.          Marland Kitchen zolpidem  (AMBIEN) 10 MG tablet   Oral   Take 10 mg by mouth at bedtime.           Allergies Erythromycin base; Rosiglitazone maleate; Codeine sulfate; and Tetanus-diphtheria toxoids td  Family History  Problem Relation Age of Onset  . Hypertension Mother   . CAD Sister  Social History Social History  Substance Use Topics  . Smoking status: Never Smoker   . Smokeless tobacco: Never Used  . Alcohol Use: No    Review of Systems Constitutional: No fever/chills Eyes: No visual changes. ENT: No sore throat. No stiff neck no neck pain Cardiovascular: Denies chest pain. Respiratory: Denies shortness of breath. Gastrointestinal:  See history of present illness  Genitourinary: Negative for dysuria. Musculoskeletal: Negative lower extremity swelling Skin: Negative for rash. Neurological: Negative for headaches, focal weakness or numbness. 10-point ROS otherwise negative.  ____________________________________________   PHYSICAL EXAM:  VITAL SIGNS: ED Triage Vitals  Enc Vitals Group     BP 08/14/15 2150 181/98 mmHg     Pulse Rate 08/14/15 2150 107     Resp 08/14/15 2150 18     Temp 08/14/15 2150 98.5 F (36.9 C)     Temp Source 08/14/15 2150 Oral     SpO2 08/14/15 2150 96 %     Weight 08/14/15 1951 153 lb (69.4 kg)     Height 08/14/15 1951 5' 3"  (1.6 m)     Head Cir --      Peak Flow --      Pain Score 08/14/15 1951 10     Pain Loc --      Pain Edu? --      Excl. in [place]? --     Constitutional: Alert and oriented. Patient initially was yelling and gagging at the same time with no evidence of actual emesis. When I asked her to stop she was able to stop. Eyes: Conjunctivae are normal. PERRL. EOMI. Head: Atraumatic. Nose: No congestion/rhinnorhea. Mouth/Throat: Mucous membranes are moist.  Oropharynx non-erythematous. Neck: No stridor.   Nontender with no meningismus Cardiovascular: Normal rate, regular rhythm. Grossly normal heart sounds.  Good peripheral  circulation. Respiratory: Normal respiratory effort.  No retractions. Lungs CTAB. Abdominal: Diffuse tenderness reported although again somewhat unreliable strain, abdomen is distended but not tympanic. Bowel sounds are hypoactive but present. No rebound. Back:  There is no focal tenderness or step off there is no midline tenderness there are no lesions noted. there is no CVA tenderness Musculoskeletal: No lower extremity tenderness. No joint effusions, no DVT signs strong distal pulses no edema Neurologic:  Normal speech and language. No gross focal neurologic deficits are appreciated.  Skin:  Skin is warm, dry and intact. No rash noted. Psychiatric: Mood and affect are normal. Speech and behavior are normal.  ____________________________________________   LABS (all labs ordered are listed, but only abnormal results are displayed)  Labs Reviewed  CBC WITH DIFFERENTIAL/PLATELET - Abnormal; Notable for the following:    Hemoglobin 11.9 (*)    RDW 15.0 (*)    Lymphs Abs 0.6 (*)    All other components within normal limits  COMPREHENSIVE METABOLIC PANEL - Abnormal; Notable for the following:    Potassium 3.4 (*)    Glucose, Bld 265 (*)    Calcium 8.7 (*)    Total Protein 6.4 (*)    AST 57 (*)    ALT 72 (*)    All other components within normal limits  LACTIC ACID, PLASMA - Abnormal; Notable for the following:    Lactic Acid, Venous 2.3 (*)    All other components within normal limits  LIPASE, BLOOD  PROTIME-INR  APTT  LACTIC ACID, PLASMA   ____________________________________________  EKG  I personally interpreted any EKGs ordered by me or triage  ____________________________________________  RADIOLOGY  I reviewed any imaging ordered by me or  triage that were performed during my shift ____________________________________________   PROCEDURES  Procedure(s) performed: None  Critical Care performed: None  ____________________________________________   INITIAL  IMPRESSION / ASSESSMENT AND PLAN / ED COURSE  Pertinent labs & imaging results that were available during my care of the patient were reviewed by me and considered in my medical decision making (see chart for details).  H with chronic abdominal pain. She also seemed to have a fairly significant amount of gagging but no actual vomiting here. She states she's been vomiting nonstop for 3 days. I am however reassured by her lab findings which do not betray any evidence of acute elevation in white count, anemia, transaminase elevation, she has a reassuring BUN/creatinine which partially could be secondary to her poor muscle mass. Glucose is not critically high although it is elevated as is often the case. Her anion gap is only 5. Nonspecific finding of borderline lactic acid is noted; any stress on the body can cause this. However, patient does have real significant abdominal disease, and even though her blood work is reassuring I do feel that a CT scan is warranted to rule out ileus or other intra-abdominal pathologies such as she has had multiple times in the past. Without fever or white count I find it unlikely that she has spontaneous bacterial peritonitis. Patient has requested narcotic pain medication multiple times here I did give her Haldol however which has cured her nausea for the time being and I will defer further narcotic pain medication pending CT findings as I do not wish to contribute to any slowdown of her bowels  ----------------------------------------- 11:35 PM on 08/14/2015 -----------------------------------------  Patient's abdomen remains nonsurgical, mild tenderness noted somewhat distractible. CT scan is quite reassuring no evidence of obstruction. Patient has not vomited since the Haldol and she is willing to try by mouth. She states she still has pain. She states usually the only thing that is really made her pain better so far is nylon did, have explained that we likely will not be  giving her Dilaudid today. Her heart rate is down in the 80s at this time, she is much more calm. And she is starting to feel better. There is no acute surgical pathology noted. She has been made aware of her CT findings and we will reassess  ----------------------------------------- 11:58 PM on 08/14/2015 -----------------------------------------  Dr. Beather Arbour to f/u on pt after po challenge and pain medications.  ____________________________________________   FINAL CLINICAL IMPRESSION(S) / ED DIAGNOSES  Final diagnoses:  None     Schuyler Amor, MD 08/14/15 [place], MD 08/14/15 [id number]  Schuyler Amor, MD 08/14/15 [phone]

## 2015-08-14 NOTE — ED Notes (Signed)
Pt arrived via [facility], reports abd pain X5dys with constipation, tender and distened abdomen on assessment. Was diagnosed with C.diff 1 week ago. Given 62m zofran with no relief.

## 2015-08-15 DIAGNOSIS — R630 Anorexia: Secondary | ICD-10-CM | POA: Diagnosis not present

## 2015-08-15 DIAGNOSIS — I864 Gastric varices: Secondary | ICD-10-CM | POA: Diagnosis not present

## 2015-08-15 DIAGNOSIS — K7460 Unspecified cirrhosis of liver: Secondary | ICD-10-CM | POA: Diagnosis not present

## 2015-08-15 DIAGNOSIS — I8500 Esophageal varices without bleeding: Secondary | ICD-10-CM | POA: Diagnosis not present

## 2015-08-15 DIAGNOSIS — E119 Type 2 diabetes mellitus without complications: Secondary | ICD-10-CM | POA: Diagnosis not present

## 2015-08-15 DIAGNOSIS — R1084 Generalized abdominal pain: Secondary | ICD-10-CM | POA: Diagnosis not present

## 2015-08-15 DIAGNOSIS — R112 Nausea with vomiting, unspecified: Secondary | ICD-10-CM | POA: Diagnosis not present

## 2015-08-15 DIAGNOSIS — J9811 Atelectasis: Secondary | ICD-10-CM | POA: Diagnosis not present

## 2015-08-15 DIAGNOSIS — I10 Essential (primary) hypertension: Secondary | ICD-10-CM | POA: Diagnosis not present

## 2015-08-15 DIAGNOSIS — N200 Calculus of kidney: Secondary | ICD-10-CM | POA: Diagnosis not present

## 2015-08-15 DIAGNOSIS — R188 Other ascites: Secondary | ICD-10-CM | POA: Diagnosis not present

## 2015-08-15 LAB — GLUCOSE, CAPILLARY
Glucose-Capillary: 262 mg/dL — ABNORMAL HIGH (ref 65–99)
Glucose-Capillary: 214 mg/dL — ABNORMAL HIGH (ref 65–99)
Glucose-Capillary: 188 mg/dL — ABNORMAL HIGH (ref 65–99)
Glucose-Capillary: 133 mg/dL — ABNORMAL HIGH (ref 65–99)
Glucose-Capillary: 108 mg/dL — ABNORMAL HIGH (ref 65–99)

## 2015-08-15 LAB — LACTIC ACID, PLASMA: LACTIC ACID, VENOUS: 1.9 mmol/L (ref 0.5–2.0)

## 2015-08-15 MED ORDER — LEVOTHYROXINE SODIUM 75 MCG PO TABS
75.0000 ug | ORAL_TABLET | Freq: Every day | ORAL | Status: DC
  Administered 2015-08-15 – 2015-08-16 (×2): 75 ug via ORAL
  Filled 2015-08-15 (×2): qty 1

## 2015-08-15 MED ORDER — TIZANIDINE HCL 4 MG PO TABS: 4.0000 mg | ORAL_TABLET | Freq: Four times a day (QID) | ORAL | Status: DC | PRN

## 2015-08-15 MED ORDER — CITALOPRAM HYDROBROMIDE 20 MG PO TABS
20.0000 mg | ORAL_TABLET | Freq: Every day | ORAL | Status: DC
  Administered 2015-08-15 – 2015-08-16 (×2): 20 mg via ORAL
  Filled 2015-08-15 (×2): qty 1

## 2015-08-15 MED ORDER — POTASSIUM CHLORIDE 20 MEQ PO PACK
40.0000 meq | PACK | Freq: Once | ORAL | Status: AC
  Administered 2015-08-15: 18:00:00 40 meq via ORAL
  Filled 2015-08-15: qty 2

## 2015-08-15 MED ORDER — LISINOPRIL 10 MG PO TABS
5.0000 mg | ORAL_TABLET | Freq: Two times a day (BID) | ORAL | Status: DC
  Administered 2015-08-15 – 2015-08-16 (×3): 5 mg via ORAL
  Filled 2015-08-15 (×3): qty 1

## 2015-08-15 MED ORDER — ONDANSETRON 4 MG PO TBDP
4.0000 mg | ORAL_TABLET | Freq: Once | ORAL | Status: AC
  Administered 2015-08-15: 4 mg via ORAL

## 2015-08-15 MED ORDER — MORPHINE SULFATE (PF) 2 MG/ML IV SOLN
2.0000 mg | INTRAVENOUS | Status: DC | PRN
  Administered 2015-08-15 – 2015-08-16 (×5): 2 mg via INTRAVENOUS
  Filled 2015-08-15 (×5): qty 1

## 2015-08-15 MED ORDER — AMIODARONE HCL 200 MG PO TABS
200.0000 mg | ORAL_TABLET | Freq: Every day | ORAL | Status: DC
  Administered 2015-08-15 – 2015-08-16 (×2): 200 mg via ORAL
  Filled 2015-08-15 (×2): qty 1

## 2015-08-15 MED ORDER — APIXABAN 5 MG PO TABS
5.0000 mg | ORAL_TABLET | Freq: Two times a day (BID) | ORAL | Status: DC
  Administered 2015-08-15 – 2015-08-16 (×4): 5 mg via ORAL
  Filled 2015-08-15 (×5): qty 1

## 2015-08-15 MED ORDER — INSULIN DETEMIR 100 UNIT/ML ~~LOC~~ SOLN
16.0000 [IU] | Freq: Every day | SUBCUTANEOUS | Status: DC
  Filled 2015-08-15 (×2): qty 0.16

## 2015-08-15 MED ORDER — DIPHENHYDRAMINE HCL 50 MG/ML IJ SOLN
12.5000 mg | Freq: Four times a day (QID) | INTRAMUSCULAR | Status: DC | PRN
Start: 2015-08-15 — End: 2015-08-16

## 2015-08-15 MED ORDER — ONDANSETRON 4 MG PO TBDP
ORAL_TABLET | ORAL | Status: AC
  Administered 2015-08-15: 4 mg via ORAL
  Filled 2015-08-15: qty 1

## 2015-08-15 MED ORDER — RIFAXIMIN 550 MG PO TABS
550.0000 mg | ORAL_TABLET | Freq: Two times a day (BID) | ORAL | Status: DC
  Administered 2015-08-15 – 2015-08-16 (×4): 550 mg via ORAL
  Filled 2015-08-15 (×4): qty 1

## 2015-08-15 MED ORDER — INSULIN DETEMIR 100 UNIT/ML ~~LOC~~ SOLN
10.0000 [IU] | Freq: Every day | SUBCUTANEOUS | Status: DC
  Administered 2015-08-15: 02:00:00 10 [IU] via SUBCUTANEOUS
  Filled 2015-08-15 (×2): qty 0.1

## 2015-08-15 MED ORDER — POTASSIUM CHLORIDE IN NACL 40-0.9 MEQ/L-% IV SOLN
INTRAVENOUS | Status: DC
  Administered 2015-08-15: 100 mL/h via INTRAVENOUS
  Filled 2015-08-15 (×3): qty 1000

## 2015-08-15 MED ORDER — ZOLPIDEM TARTRATE 5 MG PO TABS
10.0000 mg | ORAL_TABLET | Freq: Every day | ORAL | Status: DC
  Administered 2015-08-15: 10 mg via ORAL
  Filled 2015-08-15: qty 2

## 2015-08-15 MED ORDER — MORPHINE SULFATE ER 30 MG PO TBCR
60.0000 mg | EXTENDED_RELEASE_TABLET | Freq: Two times a day (BID) | ORAL | Status: DC
  Administered 2015-08-15 – 2015-08-16 (×3): 60 mg via ORAL
  Filled 2015-08-15 (×3): qty 2

## 2015-08-15 MED ORDER — INSULIN ASPART 100 UNIT/ML ~~LOC~~ SOLN
0.0000 [IU] | Freq: Every day | SUBCUTANEOUS | Status: DC
  Administered 2015-08-15: 3 [IU] via SUBCUTANEOUS
  Filled 2015-08-15: qty 3

## 2015-08-15 MED ORDER — ATORVASTATIN CALCIUM 20 MG PO TABS
40.0000 mg | ORAL_TABLET | Freq: Every day | ORAL | Status: DC
  Administered 2015-08-15: 40 mg via ORAL
  Filled 2015-08-15: qty 2

## 2015-08-15 MED ORDER — FAMOTIDINE IN NACL 20-0.9 MG/50ML-% IV SOLN
20.0000 mg | Freq: Two times a day (BID) | INTRAVENOUS | Status: DC
  Administered 2015-08-15 – 2015-08-16 (×4): 20 mg via INTRAVENOUS
  Filled 2015-08-15 (×5): qty 50

## 2015-08-15 MED ORDER — SODIUM CHLORIDE 0.9 % IJ SOLN
3.0000 mL | Freq: Two times a day (BID) | INTRAMUSCULAR | Status: DC
  Administered 2015-08-15 – 2015-08-16 (×2): 3 mL via INTRAVENOUS

## 2015-08-15 MED ORDER — SODIUM CHLORIDE 0.9 % IV SOLN
INTRAVENOUS | Status: DC
  Administered 2015-08-15 – 2015-08-16 (×2): via INTRAVENOUS

## 2015-08-15 MED ORDER — ONDANSETRON HCL 4 MG/2ML IJ SOLN
4.0000 mg | Freq: Four times a day (QID) | INTRAMUSCULAR | Status: DC | PRN
  Administered 2015-08-15 – 2015-08-16 (×4): 4 mg via INTRAVENOUS
  Filled 2015-08-15 (×4): qty 2

## 2015-08-15 MED ORDER — ONDANSETRON HCL 4 MG PO TABS: 4.0000 mg | ORAL_TABLET | Freq: Four times a day (QID) | ORAL | Status: DC | PRN

## 2015-08-15 MED ORDER — DICYCLOMINE HCL 10 MG PO CAPS
10.0000 mg | ORAL_CAPSULE | Freq: Three times a day (TID) | ORAL | Status: DC
  Administered 2015-08-15 – 2015-08-16 (×5): 10 mg via ORAL
  Filled 2015-08-15 (×8): qty 1

## 2015-08-15 MED ORDER — DOCUSATE SODIUM 100 MG PO CAPS
100.0000 mg | ORAL_CAPSULE | Freq: Two times a day (BID) | ORAL | Status: DC
  Administered 2015-08-15 – 2015-08-16 (×4): 100 mg via ORAL
  Filled 2015-08-15 (×4): qty 1

## 2015-08-15 MED ORDER — INSULIN ASPART 100 UNIT/ML ~~LOC~~ SOLN
0.0000 [IU] | Freq: Three times a day (TID) | SUBCUTANEOUS | Status: DC
  Administered 2015-08-15: 18:00:00 1 [IU] via SUBCUTANEOUS
  Administered 2015-08-15: 11:00:00 2 [IU] via SUBCUTANEOUS
  Administered 2015-08-15: 3 [IU] via SUBCUTANEOUS
  Administered 2015-08-16: 2 [IU] via SUBCUTANEOUS
  Filled 2015-08-15: qty 1
  Filled 2015-08-15 (×2): qty 2
  Filled 2015-08-15: qty 3

## 2015-08-15 MED ORDER — METOPROLOL SUCCINATE ER 25 MG PO TB24
25.0000 mg | ORAL_TABLET | Freq: Every day | ORAL | Status: DC
Start: 2015-08-15 — End: 2015-08-16
  Administered 2015-08-15 – 2015-08-16 (×2): 25 mg via ORAL
  Filled 2015-08-15 (×2): qty 1

## 2015-08-15 NOTE — Plan of Care (Signed)
Problem: Safety: Goal: Ability to remain free from injury will improve Outcome: Progressing High fall risk. Patient calls out with needs.  Problem: Pain Managment: Goal: General experience of comfort will improve Outcome: Progressing Morphine given 1x. Patient c/o pain. Patient  Stated relief. zofran given 1z for nausea. IVF infusing No emesis or dry heaving. Diet advanced to full liquid.

## 2015-08-15 NOTE — ED Provider Notes (Signed)
-----------------------------------------   12:29 AM on 08/15/2015 -----------------------------------------  Patient admitted to the hospitalist for intractable nausea and abdominal pain.  Paulette Blanch, MD 08/15/15 [phone]

## 2015-08-15 NOTE — Progress Notes (Signed)
Inpatient Diabetes Program Recommendations  AACE/ADA: New Consensus Statement on Inpatient Glycemic Control (2015)  Target Ranges:  Prepandial:   less than 140 mg/dL      Peak postprandial:   less than 180 mg/dL (1-2 hours)      Critically ill patients:  140 - 180 mg/dL   Review of Glycemic Control:  Results for CARLEN, REBUCK (MRN [id number]) as of 08/15/2015 10:12  Ref. Range 08/15/2015 01:38 08/15/2015 07:09  Glucose-Capillary Latest Ref Range: 65-99 mg/dL 262 (H) 214 (H)  Results for ANNJANETTE, WERTENBERGER (MRN [id number]) as of 08/15/2015 10:12  Ref. Range 05/19/2015 20:30 07/05/2015 07:24  Hemoglobin A1C Latest Ref Range: 4.0-6.0 % 7.1 (H) 9.2 (H)   Diabetes history: Type 1 diabetes per Health history Outpatient Diabetes medications: Levemir 17 units daily Current orders for Inpatient glycemic control:  Levemir 10 units daily, Novolog sensitive tid with meals and HS  Inpatient Diabetes Program Recommendations:   May consider increasing Levemir to 16 units q HS.  Will follow.  Thanks, Adah Perl, RN, BC-ADM Inpatient Diabetes Coordinator Pager [phone] (8a-5p)

## 2015-08-15 NOTE — Progress Notes (Signed)
[place] [facility] [place] NAME: [name]    MR#:  [id number]  DATE OF BIRTH:  19-Jan-1956  SUBJECTIVE:  CHIEF COMPLAINT:  Patient is resting comfortably. Denies any nausea or vomiting. Denies any diarrhea either. Patient sees gastroenterology as an outpatient regarding her liver cirrhosis. Denies any abdominal discomfort or no sick contacts Denies past medical history of C. difficile colitis  REVIEW OF SYSTEMS:  CONSTITUTIONAL: No fever, fatigue or weakness.  EYES: No blurred or double vision.  EARS, NOSE, AND THROAT: No tinnitus or ear pain.  RESPIRATORY: No cough, shortness of breath, wheezing or hemoptysis.  CARDIOVASCULAR: No chest pain, orthopnea, edema.  GASTROINTESTINAL: Recently diagnosed with liver cirrhosis and had paracentesis done for ascites .No nausea, vomiting, diarrhea or abdominal pain.  GENITOURINARY: No dysuria, hematuria.  ENDOCRINE: No polyuria, nocturia,  HEMATOLOGY: No anemia, easy bruising or bleeding SKIN: No rash or lesion. MUSCULOSKELETAL: No joint pain or arthritis.   NEUROLOGIC: No tingling, numbness, weakness.  PSYCHIATRY: No anxiety or depression.   DRUG ALLERGIES:   Allergies  Allergen Reactions  . Erythromycin Base Other (See Comments)    Reaction:  Fever   . Rosiglitazone Maleate Swelling  . Codeine Sulfate Rash  . Tetanus-Diphtheria Toxoids Td Rash and Other (See Comments)    Reaction:  Fever     VITALS:  Blood pressure 94/61, pulse 91, temperature 97.6 F (36.4 C), temperature source Oral, resp. rate 17, height 5' 6"  (1.676 m), weight 60.464 kg (133 lb 4.8 oz), SpO2 97 %.  PHYSICAL EXAMINATION:  GENERAL:  59 y.o.-year-old patient lying in the bed with no acute distress.  EYES: Pupils equal, round, reactive to light and accommodation. No scleral icterus. Extraocular muscles intact.  HEENT: Head atraumatic, normocephalic. Oropharynx and nasopharynx clear.  NECK:  Supple, no jugular venous  distention. No thyroid enlargement, no tenderness.  LUNGS: Normal breath sounds bilaterally, no wheezing, rales,rhonchi or crepitation. No use of accessory muscles of respiration.  CARDIOVASCULAR: S1, S2 normal. No murmurs, rubs, or gallops.  ABDOMEN: Soft, nontender, slightly distended with fluid. Bowel sounds present. No organomegaly or mass.  EXTREMITIES: No pedal edema, cyanosis, or clubbing.  NEUROLOGIC: Cranial nerves II through XII are intact. Muscle strength 5/5 in all extremities. Sensation intact. Gait not checked.  PSYCHIATRIC: The patient is alert and oriented x 3.  SKIN: No obvious rash, lesion, or ulcer.    LABORATORY PANEL:   CBC  Recent Labs Lab 08/14/15 2014  WBC 5.8  HGB 11.9*  HCT 37.2  PLT 155   ------------------------------------------------------------------------------------------------------------------  Chemistries   Recent Labs Lab 08/14/15 2014  NA 136  K 3.4*  CL 107  CO2 24  GLUCOSE 265*  BUN 14  CREATININE 0.57  CALCIUM 8.7*  AST 57*  ALT 72*  ALKPHOS 118  BILITOT 0.6   ------------------------------------------------------------------------------------------------------------------  Cardiac Enzymes No results for input(s): TROPONINI in the last 168 hours. ------------------------------------------------------------------------------------------------------------------  RADIOLOGY:  Ct Abdomen Pelvis W Contrast  08/14/2015  CLINICAL DATA:  Acute onset of nausea and anorexia. Generalized abdominal pain. Initial encounter. EXAM: CT ABDOMEN AND PELVIS WITH CONTRAST TECHNIQUE: Multidetector CT imaging of the abdomen and pelvis was performed using the standard protocol following bolus administration of intravenous contrast. CONTRAST:  128m OMNIPAQUE IOHEXOL 300 MG/ML  SOLN COMPARISON:  CT of the abdomen and pelvis from 07/31/2015 FINDINGS: Mild bibasilar atelectasis is noted. Moderate ascites is noted within the abdomen and pelvis. The  diffusely nodular contour of the liver is compatible with  hepatic cirrhosis. Scattered splenic, gastric and esophageal varices are noted. The spleen is unremarkable in appearance. The patient is status post cholecystectomy, with clips noted at the gallbladder fossa. The pancreas and adrenal glands are unremarkable. A 1.1 cm stone is noted at the lower pole of the right kidney. The kidneys are otherwise unremarkable. There is no evidence of hydronephrosis. No obstructing ureteral stones are seen. Mild nonspecific perinephric stranding is noted. There is distention of the jejunum and proximal ileum, with suggestion of mild wall thickening. This measures up to 5.1 cm in diameter. No transition point is seen. This is concerning for dysmotility due to an infectious or inflammatory process. The stomach is within normal limits. No acute vascular abnormalities are seen. Mild calcification is noted along the abdominal aorta and its branches. The appendix is normal in caliber, without evidence of appendicitis. The colon is unremarkable in appearance. The bladder is mildly distended and grossly unremarkable. The patient is status post hysterectomy. No suspicious adnexal masses are seen. No inguinal lymphadenopathy is seen. No acute osseous abnormalities are identified. Mild facet disease is noted at the lower lumbar spine. IMPRESSION: 1. Distention of the jejunum and proximal ileum, with suggestion of mild wall thickening. This measures up to 5.1 cm in diameter, without evidence of a transition point. This is concerning for dysmotility due to an infectious or inflammatory process. 2. Moderate ascites noted within the abdomen and pelvis. 3. Findings of hepatic cirrhosis, with scattered splenic, gastric and esophageal varices. 4. Large right renal stone noted.  Kidneys otherwise unremarkable. 5. Mild bibasilar atelectasis noted. 6. Mild calcification along the abdominal aorta and its branches. Electronically Signed   By: Garald Balding M.D.   On: 08/14/2015 23:14    EKG:   Orders placed or performed during the hospital encounter of 07/31/15  . EKG 12-Lead  . EKG 12-Lead    ASSESSMENT AND PLAN:   This is a 59 year old Caucasian female past medical history significant for cirrhosis of the liver and ascites who is admitted for intractable nausea and vomiting. 1. Nausea and vomiting: Probably viral Clinically improving  We'll continue  to provide supportive care and IV fluids and antiemetics .  Check a.m. labs  2. Ascites: Secondary to cirrhosis. The patient underwent a paracentesis which showed transudate on her last admission. No signs of SBP at this time.  Continue rifaximin .  Outpatient follow-up with gastroenterology as recommended following discharge  Abnormal CAT scan result with jejunum and ileum wall thickening, patient is asymptomatic, outpatient GI follow-up is recommended  3. Hypertension: Uncontrolled likely due to vomiting all of her antihypertensive medication. Restart metoprolol and lisinopril.  4. Diabetes mellitus type 2: Continue basal insulin well as sliding scale insulin. 5. Hypothyroidism: Continue Synthroid 6. A. fib: Controlled; continue amiodarone and Eliquis 7. Depression: Continue Celexa  8. DVT prophylaxis: SCDs 9. GI prophylaxis: Continue famotidine (IV for now as the patient cannot take by mouth) The patient is a full code. Time spent on admission was inpatient care approximately 45 minutes     All the records are reviewed and case discussed with Care Management/Social Workerr. Management plans discussed with the patient, family and they are in agreement.  CODE STATUS: fc  TOTAL TIME TAKING CARE OF THIS PATIENT: 35  minutes.   POSSIBLE D/C IN 1-2  DAYS, DEPENDING ON CLINICAL CONDITION.   Nicholes Mango M.D on 08/15/2015 at 3:40 PM  Between 7am to 6pm - Pager - [phone] After 6pm go to [email or web address] - password [email or web address]  [facility]  Office   [phone]  CC: Primary care physician; Sheral Flow, NP

## 2015-08-15 NOTE — H&P (Signed)
[name] is an 59 y.o. female.   Chief Complaint: Nausea and vomiting HPI: The patient presents emergency department complaining of nausea and vomiting 1 week. She denies any blood or any green material in her emesis. In the emergency department the patient multiple episodes of heaving but no actual vomiting. She was given Haldol which improved her nausea some. However she failed a by mouth trial after multiple doses of antiemetic which prompted the emergency Department to call for admission.  Past Medical History  Diagnosis Date  . Type 1 diabetes (HCC)   . Hypothyroid   . Degenerative disk disease   . Stomach ulcer   . Diverticulitis   . Syncope 01/2015  . Anxiety   . GERD (gastroesophageal reflux disease)   . History of hiatal hernia   . Cancer (HCC)     HX OF CANCER OF UTERUS   . TIA (transient ischemic attack) 02/2015  . Hypertension   . Pancreatitis   . PAF (paroxysmal atrial fibrillation) (HCC) 03/2015    a. new onset 03/2015 in setting of intractable N/V; b. on Eliquis 5 mg bid; c. CHADSVASc 4 (DM, TIA x 2, female)  . Intussusception intestine (HCC) 05/2015    Past Surgical History  Procedure Laterality Date  . Hernia repair    . Abdominal hysterectomy    . Cholecystectomy    . Esophagogastroduodenoscopy N/A 04/04/2015    Procedure: ESOPHAGOGASTRODUODENOSCOPY (EGD);  Surgeon: Paul Y Oh, MD;  Location: [facility] ENDOSCOPY;  Service: Endoscopy;  Laterality: N/A;    Family History  Problem Relation Age of Onset  . Hypertension Mother   . CAD Sister    Social History:  reports that she has never smoked. She has never used smokeless tobacco. She reports that she does not drink alcohol or use illicit drugs.  Allergies:  Allergies  Allergen Reactions  . Erythromycin Base Other (See Comments)    Reaction:  Fever   . Rosiglitazone Maleate Swelling  . Codeine Sulfate Rash  . Tetanus-Diphtheria Toxoids Td Rash and Other (See Comments)    Reaction:  Fever     Prior to  Admission medications   Medication Sig Start Date End Date Taking? Authorizing Provider  amiodarone (PACERONE) 200 MG tablet Take 1 tablet (200 mg total) by mouth daily. 05/31/15  Yes Jessica Upchurch Vann, DO  apixaban (ELIQUIS) 5 MG TABS tablet Take 1 tablet (5 mg total) by mouth 2 (two) times daily. 04/07/15  Yes Sital Mody, MD  atorvastatin (LIPITOR) 40 MG tablet Take 1 tablet (40 mg total) by mouth daily at 6 PM. 02/17/15  Yes Sital Mody, MD  citalopram (CELEXA) 20 MG tablet Take 20 mg by mouth daily.   Yes Historical Provider, MD  dicyclomine (BENTYL) 10 MG capsule Take 1 capsule by mouth 4 (four) times daily - after meals and at bedtime.  03/11/15  Yes Historical Provider, MD  insulin detemir (LEVEMIR) 100 UNIT/ML injection Inject 0.17 mLs (17 Units total) into the skin daily. 07/11/15  Yes Rima Vaickute, MD  levothyroxine (SYNTHROID, LEVOTHROID) 75 MCG tablet Take 1 tablet (75 mcg total) by mouth daily before breakfast. 08/06/15  Yes Rima Vaickute, MD  lisinopril (PRINIVIL,ZESTRIL) 10 MG tablet Take 0.5 tablets (5 mg total) by mouth 2 (two) times daily. 07/11/15  Yes Rima Vaickute, MD  metoprolol succinate (TOPROL-XL) 25 MG 24 hr tablet Take 1 tablet (25 mg total) by mouth daily. 06/14/15  Yes Katherine K Clark, NP  morphine (MS CONTIN) 60 MG 12 hr tablet   Take 1 tablet (60 mg total) by mouth 2 (two) times daily. 07/11/15  Yes Theodoro Grist, MD  pantoprazole (PROTONIX) 40 MG tablet Take 1 tablet (40 mg total) by mouth 2 (two) times daily. 04/07/15  Yes Sital Mody, MD  psyllium (METAMUCIL) 58.6 % packet Take 1 packet by mouth daily as needed (constipation).    Yes Historical Provider, MD  ranitidine (ZANTAC) 150 MG capsule Take 1 capsule (150 mg total) by mouth 2 (two) times daily. 07/03/15  Yes Carrie Mew, MD  rifaximin (XIFAXAN) 550 MG TABS tablet Take 1 tablet (550 mg total) by mouth 2 (two) times daily. 07/11/15  Yes Theodoro Grist, MD  tiZANidine (ZANAFLEX) 4 MG tablet Take 4 mg by mouth  every 6 (six) hours as needed for muscle spasms.    Yes Historical Provider, MD  zolpidem (AMBIEN) 10 MG tablet Take 10 mg by mouth at bedtime.   Yes Historical Provider, MD  ondansetron (ZOFRAN) 4 MG tablet Take 1 tablet (4 mg total) by mouth daily as needed for nausea or vomiting. 08/14/15   Schuyler Amor, MD  oxyCODONE-acetaminophen (ROXICET) 5-325 MG tablet Take 1 tablet by mouth every 6 (six) hours as needed. 08/14/15   Schuyler Amor, MD     Results for orders placed or performed during the hospital encounter of 08/14/15 (from the past 48 hour(s))  CBC with Differential     Status: Abnormal   Collection Time: 08/14/15  8:14 PM  Result Value Ref Range   WBC 5.8 3.6 - 11.0 K/uL   RBC 3.92 3.80 - 5.20 MIL/uL   Hemoglobin 11.9 (L) 12.0 - 16.0 g/dL   HCT 37.2 35.0 - 47.0 %   MCV 94.8 80.0 - 100.0 fL   MCH 30.3 26.0 - 34.0 pg   MCHC 32.0 32.0 - 36.0 g/dL   RDW 15.0 (H) 11.5 - 14.5 %   Platelets 155 150 - 440 K/uL   Neutrophils Relative % 84 %   Neutro Abs 4.9 1.4 - 6.5 K/uL   Lymphocytes Relative 11 %   Lymphs Abs 0.6 (L) 1.0 - 3.6 K/uL   Monocytes Relative 4 %   Monocytes Absolute 0.3 0.2 - 0.9 K/uL   Eosinophils Relative 0 %   Eosinophils Absolute 0.0 0 - 0.7 K/uL   Basophils Relative 1 %   Basophils Absolute 0.0 0 - 0.1 K/uL  Comprehensive metabolic panel     Status: Abnormal   Collection Time: 08/14/15  8:14 PM  Result Value Ref Range   Sodium 136 135 - 145 mmol/L   Potassium 3.4 (L) 3.5 - 5.1 mmol/L   Chloride 107 101 - 111 mmol/L   CO2 24 22 - 32 mmol/L   Glucose, Bld 265 (H) 65 - 99 mg/dL   BUN 14 6 - 20 mg/dL   Creatinine, Ser 0.57 0.44 - 1.00 mg/dL   Calcium 8.7 (L) 8.9 - 10.3 mg/dL   Total Protein 6.4 (L) 6.5 - 8.1 g/dL   Albumin 3.5 3.5 - 5.0 g/dL   AST 57 (H) 15 - 41 U/L   ALT 72 (H) 14 - 54 U/L   Alkaline Phosphatase 118 38 - 126 U/L   Total Bilirubin 0.6 0.3 - 1.2 mg/dL   GFR calc non Af Amer >60 >60 mL/min   GFR calc Af Amer >60 >60 mL/min    Comment:  (NOTE) The eGFR has been calculated using the CKD EPI equation. This calculation has not been validated in all clinical situations. eGFR's persistently <  60 mL/min signify possible Chronic Kidney Disease.    Anion gap 5 5 - 15  Lipase, blood     Status: None   Collection Time: 08/14/15  8:14 PM  Result Value Ref Range   Lipase 32 11 - 51 U/L  Protime-INR     Status: None   Collection Time: 08/14/15  8:14 PM  Result Value Ref Range   Prothrombin Time 14.4 11.4 - 15.0 seconds   INR 1.10   APTT     Status: None   Collection Time: 08/14/15  8:14 PM  Result Value Ref Range   aPTT 29 24 - 36 seconds  Lactic acid, plasma     Status: Abnormal   Collection Time: 08/14/15  8:14 PM  Result Value Ref Range   Lactic Acid, Venous 2.3 (HH) 0.5 - 2.0 mmol/L    Comment: CRITICAL RESULT CALLED TO, READ BACK BY AND VERIFIED WITH DAVIS BRANDY ON 08/14/15 AT 1005PM BY TB.    Ct Abdomen Pelvis W Contrast  08/14/2015  CLINICAL DATA:  Acute onset of nausea and anorexia. Generalized abdominal pain. Initial encounter. EXAM: CT ABDOMEN AND PELVIS WITH CONTRAST TECHNIQUE: Multidetector CT imaging of the abdomen and pelvis was performed using the standard protocol following bolus administration of intravenous contrast. CONTRAST:  100mL OMNIPAQUE IOHEXOL 300 MG/ML  SOLN COMPARISON:  CT of the abdomen and pelvis from 07/31/2015 FINDINGS: Mild bibasilar atelectasis is noted. Moderate ascites is noted within the abdomen and pelvis. The diffusely nodular contour of the liver is compatible with hepatic cirrhosis. Scattered splenic, gastric and esophageal varices are noted. The spleen is unremarkable in appearance. The patient is status post cholecystectomy, with clips noted at the gallbladder fossa. The pancreas and adrenal glands are unremarkable. A 1.1 cm stone is noted at the lower pole of the right kidney. The kidneys are otherwise unremarkable. There is no evidence of hydronephrosis. No obstructing ureteral stones are  seen. Mild nonspecific perinephric stranding is noted. There is distention of the jejunum and proximal ileum, with suggestion of mild wall thickening. This measures up to 5.1 cm in diameter. No transition point is seen. This is concerning for dysmotility due to an infectious or inflammatory process. The stomach is within normal limits. No acute vascular abnormalities are seen. Mild calcification is noted along the abdominal aorta and its branches. The appendix is normal in caliber, without evidence of appendicitis. The colon is unremarkable in appearance. The bladder is mildly distended and grossly unremarkable. The patient is status post hysterectomy. No suspicious adnexal masses are seen. No inguinal lymphadenopathy is seen. No acute osseous abnormalities are identified. Mild facet disease is noted at the lower lumbar spine. IMPRESSION: 1. Distention of the jejunum and proximal ileum, with suggestion of mild wall thickening. This measures up to 5.1 cm in diameter, without evidence of a transition point. This is concerning for dysmotility due to an infectious or inflammatory process. 2. Moderate ascites noted within the abdomen and pelvis. 3. Findings of hepatic cirrhosis, with scattered splenic, gastric and esophageal varices. 4. Large right renal stone noted.  Kidneys otherwise unremarkable. 5. Mild bibasilar atelectasis noted. 6. Mild calcification along the abdominal aorta and its branches. Electronically Signed   By: Jeffery  Chang M.D.   On: 08/14/2015 23:14    Review of Systems  Constitutional: Negative for fever and chills.  HENT: Negative for sore throat and tinnitus.   Eyes: Negative for blurred vision and redness.  Respiratory: Negative for cough and shortness of breath.   Cardiovascular:   Negative for chest pain, palpitations, orthopnea and PND.  Gastrointestinal: Positive for nausea and vomiting. Negative for abdominal pain and diarrhea.  Genitourinary: Negative for dysuria, urgency and  frequency.  Musculoskeletal: Negative for myalgias and joint pain.  Skin: Negative for rash.       No lesions  Neurological: Negative for speech change, focal weakness and weakness.  Endo/Heme/Allergies: Does not bruise/bleed easily.       No temperature intolerance  Psychiatric/Behavioral: Negative for depression and suicidal ideas.    Blood pressure 189/101, pulse 103, temperature 98.5 F (36.9 C), temperature source Oral, resp. rate 27, height 5' 3" (1.6 m), weight 69.4 kg (153 lb), SpO2 94 %. Physical Exam  Nursing note and vitals reviewed. Constitutional: She is oriented to person, place, and time. She appears well-developed and well-nourished. No distress.  HENT:  Head: Normocephalic and atraumatic.  Mouth/Throat: Oropharynx is clear and moist.  Eyes: Conjunctivae and EOM are normal. Pupils are equal, round, and reactive to light. No scleral icterus.  Neck: Normal range of motion. Neck supple. No JVD present. No tracheal deviation present. No thyromegaly present.  Cardiovascular: Normal rate, regular rhythm and normal heart sounds.  Exam reveals no gallop and no friction rub.   No murmur heard. Respiratory: Effort normal and breath sounds normal.  GI: Soft. Bowel sounds are normal. She exhibits no distension. There is no tenderness.  Genitourinary:  Deferred  Lymphadenopathy:    She has no cervical adenopathy.  Neurological: She is alert and oriented to person, place, and time. No cranial nerve deficit. She exhibits normal muscle tone.  Skin: Skin is warm and dry. No rash noted. No erythema.  Psychiatric: She has a normal mood and affect. Her behavior is normal. Judgment and thought content normal.     Assessment/Plan This is a 59-year-old Caucasian female past medical history significant for cirrhosis of the liver and ascites who is admitted for intractable nausea and vomiting. 1. Nausea and vomiting: We'll provide supportive care and symptomatic relief. The patient does not  appear to be significantly dehydrated although her lactic acid is mildly elevated. Place her on intravenous fluid for hydration and replete her potassium. On previous admission a few weeks ago the patient was found to have a partial bowel obstruction. She may be continued to have complications from this although CT of the abdomen does not demonstrate anatomical obstruction. Admit is nonsurgical 2. Ascites: Secondary to cirrhosis. The patient underwent a paracentesis which showed transudate on her last admission. No signs of SBP at this time. Continue rifaximin for SBP prophylaxis. The patient is not on any diuretic therapy. I will do her chart to see if there is a reason for this. Shortly part of her abdominal pain must be tension from ascites which may be relieved with diuretic therapy if it is appropriate for this patient. 3. Hypertension: Uncontrolled likely due to vomiting all of her antihypertensive medication. Restart metoprolol and lisinopril. 4. Diabetes mellitus type 2: Continue basal insulin well as sliding scale insulin. 5. Hypothyroidism: Continue Synthroid 6. A. fib: Controlled; continue amiodarone and Eliquis 7. Depression: Continue Celexa  8. DVT prophylaxis: SCDs 9. GI prophylaxis: Continue famotidine (IV for now as the patient cannot take by mouth) The patient is a full code. Time spent on admission was inpatient care approximately 45 minutes  ,   S 08/15/2015, 12:20 AM    

## 2015-08-15 NOTE — Plan of Care (Signed)
Problem: Education: Goal: Knowledge of [facility] Education information/materials will improve Outcome: Progressing Patient given patient information folder.  Education given regarding treatment plan for this shift.    Problem: Safety: Goal: Ability to remain free from injury will improve Outcome: Progressing Patient steady on feet.  Up with stand-by assistance for safety.  Bed in lowest position with call bell and phone within reach.  Bed alarm on throughout shift.  Patient without injury.  Problem: Pain Managment: Goal: General experience of comfort will improve Outcome: Progressing Patient admitted for N/V.  Complaints of abdominal pain.  Morphine ER scheduled given.  Patient on C.Dif precautions as she has chronic C.Dif since September and was positive when here 10 days ago.

## 2015-08-15 NOTE — Progress Notes (Addendum)
Enteric precautions discontinued due to patient not having a positive cdiff history. PCR was negative on 10/26. Verified with patient, she said she wasn't sure if she had CDiff but knew we were testing for it on her last admission. She said she was not diagnosed with Cdiff from an outpatient MD either. Consulted with Hubert Azure and notified Dr. Margaretmary Eddy. Patients las BM was yesterday. Okay to take patient off enteric precautions.

## 2015-08-15 NOTE — Care Management Obs Status (Signed)
[facility] NOTIFICATION   Patient Details  Name: [name] MRN: [id number] Date of Birth: [birth year]   Medicare Observation Status Notification Given:  Yes    Katrina Stack, RN 08/15/2015, 10:52 AM

## 2015-08-15 NOTE — Care Management (Signed)
Patient is currently open to [facility].  Agency informed of admission.  Currently being seen by SN and PT

## 2015-08-15 NOTE — Progress Notes (Signed)
BP bordering low with this known history for this pt. Sleeping except when aroused ; drops off to sleep during conversations. Sedating meds held with pt agreeable; BP med held due to low blood pressure/asymptomatic. Tolerating po's well. FSBS 108= pt refused hs insulin. Will continue to monitor.

## 2015-08-16 DIAGNOSIS — R1084 Generalized abdominal pain: Secondary | ICD-10-CM | POA: Diagnosis not present

## 2015-08-16 DIAGNOSIS — R188 Other ascites: Secondary | ICD-10-CM | POA: Diagnosis not present

## 2015-08-16 DIAGNOSIS — K7460 Unspecified cirrhosis of liver: Secondary | ICD-10-CM | POA: Diagnosis not present

## 2015-08-16 DIAGNOSIS — I8500 Esophageal varices without bleeding: Secondary | ICD-10-CM | POA: Diagnosis not present

## 2015-08-16 DIAGNOSIS — I864 Gastric varices: Secondary | ICD-10-CM | POA: Diagnosis not present

## 2015-08-16 DIAGNOSIS — I10 Essential (primary) hypertension: Secondary | ICD-10-CM | POA: Diagnosis not present

## 2015-08-16 DIAGNOSIS — E119 Type 2 diabetes mellitus without complications: Secondary | ICD-10-CM | POA: Diagnosis not present

## 2015-08-16 DIAGNOSIS — J9811 Atelectasis: Secondary | ICD-10-CM | POA: Diagnosis not present

## 2015-08-16 DIAGNOSIS — R112 Nausea with vomiting, unspecified: Secondary | ICD-10-CM | POA: Diagnosis not present

## 2015-08-16 DIAGNOSIS — R630 Anorexia: Secondary | ICD-10-CM | POA: Diagnosis not present

## 2015-08-16 DIAGNOSIS — N200 Calculus of kidney: Secondary | ICD-10-CM | POA: Diagnosis not present

## 2015-08-16 LAB — CBC
HEMATOCRIT: 30.9 % — AB (ref 35.0–47.0)
HEMOGLOBIN: 10.2 g/dL — AB (ref 12.0–16.0)
MCH: 31.9 pg (ref 26.0–34.0)
MCHC: 32.9 g/dL (ref 32.0–36.0)
MCV: 96.8 fL (ref 80.0–100.0)
Platelets: 134 10*3/uL — ABNORMAL LOW (ref 150–440)
RBC: 3.2 MIL/uL — ABNORMAL LOW (ref 3.80–5.20)
RDW: 15.4 % — AB (ref 11.5–14.5)
WBC: 6.8 10*3/uL (ref 3.6–11.0)

## 2015-08-16 LAB — BASIC METABOLIC PANEL
Anion gap: 3 — ABNORMAL LOW (ref 5–15)
BUN: 12 mg/dL (ref 6–20)
CALCIUM: 7.9 mg/dL — AB (ref 8.9–10.3)
CHLORIDE: 109 mmol/L (ref 101–111)
CO2: 21 mmol/L — AB (ref 22–32)
CREATININE: 0.93 mg/dL (ref 0.44–1.00)
GFR calc Af Amer: >60 mL/min (ref >60)
GFR calc non Af Amer: >60 mL/min (ref >60)
GLUCOSE: 93 mg/dL (ref 65–99)
Potassium: 4.1 mmol/L (ref 3.5–5.1)
Sodium: 133 mmol/L — ABNORMAL LOW (ref 135–145)

## 2015-08-16 LAB — PH, BODY FLUID: pH, Body Fluid: 8.3

## 2015-08-16 LAB — GLUCOSE, CAPILLARY
Glucose-Capillary: 97 mg/dL (ref 65–99)
Glucose-Capillary: 177 mg/dL — ABNORMAL HIGH (ref 65–99)

## 2015-08-16 MED ORDER — OXYCODONE-ACETAMINOPHEN 5-325 MG PO TABS: 1.0000 | ORAL_TABLET | Freq: Four times a day (QID) | ORAL | Status: DC | PRN

## 2015-08-16 MED ORDER — ONDANSETRON HCL 4 MG PO TABS: 4.0000 mg | ORAL_TABLET | Freq: Every day | ORAL | Status: DC | PRN

## 2015-08-16 NOTE — Discharge Summary (Signed)
[facility] NAME: [name]    MR#:  [id number]  DATE OF BIRTH:  1955/12/11  DATE OF ADMISSION:  08/14/2015 ADMITTING PHYSICIAN: Harrie Foreman, MD  DATE OF DISCHARGE: 08/16/2015 PRIMARY CARE PHYSICIAN: Sheral Flow, NP    ADMISSION DIAGNOSIS:  Epigastric pain [R10.13]  DISCHARGE DIAGNOSIS:  Active Problems:   Intractable nausea and vomiting  Chronic liver cirrhosis SECONDARY DIAGNOSIS:   Past Medical History  Diagnosis Date  . Type 1 diabetes ([place])   . Hypothyroid   . Degenerative disk disease   . Stomach ulcer   . Diverticulitis   . Syncope 01/2015  . Anxiety   . GERD (gastroesophageal reflux disease)   . History of hiatal hernia   . Cancer ([facility])     HX OF CANCER OF UTERUS   . TIA (transient ischemic attack) 02/2015  . Hypertension   . Pancreatitis   . PAF (paroxysmal atrial fibrillation) ([place]) 03/2015    a. new onset 03/2015 in setting of intractable N/V; b. on Eliquis 5 mg bid; c. CHADSVASc 4 (DM, TIA x 2, female)  . Intussusception intestine ([place]) 05/2015    HOSPITAL COURSE:  This is a 59 year old Caucasian female past medical history significant for cirrhosis of the liver and ascites who is admitted for intractable nausea and vomiting. 1. Nausea and vomiting: Probably viral Clinically improved  provided  supportive care and IV fluids and antiemetics .    2. Ascites: Secondary to cirrhosis. The patient underwent a paracentesis which showed transudate on her last admission. No signs of SBP at this time. Continue rifaximin .  Outpatient follow-up with gastroenterology as recommended following discharge  Abnormal CAT scan result with jejunum and ileum wall thickening, patient is asymptomatic, outpatient GI follow-up is recommended  3. Hypertension: Uncontrolled likely due to vomiting all of her antihypertensive medication. Restarted metoprolol and lisinopril.  4. Diabetes mellitus  type 2: Continue basal insulin well as sliding scale insulin. 5. Hypothyroidism: Continue Synthroid 6. A. fib: Controlled; continue amiodarone and Eliquis 7. Depression: Continue Celexa  8. DVT prophylaxis: SCDs 9. GI prophylaxis: Continue famotidine (IV for now as the patient cannot take by mouth)  DISCHARGE CONDITIONS:   fair  CONSULTS OBTAINED:    none  PROCEDURES none  DRUG ALLERGIES:   Allergies  Allergen Reactions  . Erythromycin Base Other (See Comments)    Reaction:  Fever   . Rosiglitazone Maleate Swelling  . Codeine Sulfate Rash  . Tetanus-Diphtheria Toxoids Td Rash and Other (See Comments)    Reaction:  Fever     DISCHARGE MEDICATIONS:   Discharge Medication List as of 08/16/2015  3:31 PM    START taking these medications   Details  oxyCODONE-acetaminophen (ROXICET) 5-325 MG tablet Take 1 tablet by mouth every 6 (six) hours as needed., Starting 08/14/2015, Until Discontinued, Print      CONTINUE these medications which have CHANGED   Details  ondansetron (ZOFRAN) 4 MG tablet Take 1 tablet (4 mg total) by mouth daily as needed for nausea or vomiting., Starting 08/16/2015, Until Discontinued, Print      CONTINUE these medications which have NOT CHANGED   Details  amiodarone (PACERONE) 200 MG tablet Take 1 tablet (200 mg total) by mouth daily., Starting 05/31/2015, Until Discontinued, Print    apixaban (ELIQUIS) 5 MG TABS tablet Take 1 tablet (5 mg total) by mouth 2 (two) times daily., Starting 04/07/2015, Until Discontinued, Normal    atorvastatin (LIPITOR) 40 MG  tablet Take 1 tablet (40 mg total) by mouth daily at 6 PM., Starting 02/17/2015, Until Discontinued, Normal    citalopram (CELEXA) 20 MG tablet Take 20 mg by mouth daily., Until Discontinued, Historical Med    dicyclomine (BENTYL) 10 MG capsule Take 1 capsule by mouth 4 (four) times daily - after meals and at bedtime. , Starting 03/11/2015, Until Discontinued, Historical Med    insulin detemir  (LEVEMIR) 100 UNIT/ML injection Inject 0.17 mLs (17 Units total) into the skin daily., Starting 07/11/2015, Until Discontinued, Normal    levothyroxine (SYNTHROID, LEVOTHROID) 75 MCG tablet Take 1 tablet (75 mcg total) by mouth daily before breakfast., Starting 08/06/2015, Until Discontinued, Normal    lisinopril (PRINIVIL,ZESTRIL) 10 MG tablet Take 0.5 tablets (5 mg total) by mouth 2 (two) times daily., Starting 07/11/2015, Until Discontinued, Normal    metoprolol succinate (TOPROL-XL) 25 MG 24 hr tablet Take 1 tablet (25 mg total) by mouth daily., Starting 06/14/2015, Until Discontinued, No Print    morphine (MS CONTIN) 60 MG 12 hr tablet Take 1 tablet (60 mg total) by mouth 2 (two) times daily., Starting 07/11/2015, Until Discontinued, Print    pantoprazole (PROTONIX) 40 MG tablet Take 1 tablet (40 mg total) by mouth 2 (two) times daily., Starting 04/07/2015, Until Discontinued, Normal    psyllium (METAMUCIL) 58.6 % packet Take 1 packet by mouth daily as needed (constipation). , Until Discontinued, Historical Med    ranitidine (ZANTAC) 150 MG capsule Take 1 capsule (150 mg total) by mouth 2 (two) times daily., Starting 07/03/2015, Until Discontinued, Print    rifaximin (XIFAXAN) 550 MG TABS tablet Take 1 tablet (550 mg total) by mouth 2 (two) times daily., Starting 07/11/2015, Until Discontinued, Normal    tiZANidine (ZANAFLEX) 4 MG tablet Take 4 mg by mouth every 6 (six) hours as needed for muscle spasms. , Until Discontinued, Historical Med    zolpidem (AMBIEN) 10 MG tablet Take 10 mg by mouth at bedtime., Until Discontinued, Historical Med         DISCHARGE INSTRUCTIONS:   Activity as tolerated Diet healthy heart, diabetic Follow-up with primary care physician in a week Follow-up with gastroenterology in 2 weeks   DIET:  Aha, ada diet  DISCHARGE CONDITION:  fair  ACTIVITY:  As tolerated  OXYGEN:  Home Oxygen: no   Oxygen Delivery: RA  DISCHARGE LOCATION:   HOME  If you experience worsening of your admission symptoms, develop shortness of breath, life threatening emergency, suicidal or homicidal thoughts you must seek medical attention immediately by calling 911 or calling your MD immediately  if symptoms less severe.  You Must read complete instructions/literature along with all the possible adverse reactions/side effects for all the Medicines you take and that have been prescribed to you. Take any new Medicines after you have completely understood and accpet all the possible adverse reactions/side effects.   Please note  You were cared for by a hospitalist during your hospital stay. If you have any questions about your discharge medications or the care you received while you were in the hospital after you are discharged, you can call the unit and asked to speak with the hospitalist on call if the hospitalist that took care of you is not available. Once you are discharged, your primary care physician will handle any further medical issues. Please note that NO REFILLS for any discharge medications will be authorized once you are discharged, as it is imperative that you return to your primary care physician (or establish a relationship  with a primary care physician if you do not have one) for your aftercare needs so that they can reassess your need for medications and monitor your lab values.     Today  Chief Complaint  Patient presents with  . Abdominal Pain   Pt denies any nausea, vomiting or abd pain  ROS:  CONSTITUTIONAL: Denies fevers, chills. Denies any fatigue, weakness.  EYES: Denies blurry vision, double vision, eye pain. EARS, NOSE, THROAT: Denies tinnitus, ear pain, hearing loss. RESPIRATORY: Denies cough, wheeze, shortness of breath.  CARDIOVASCULAR: Denies chest pain, palpitations, edema.  GASTROINTESTINAL: Denies nausea, vomiting, diarrhea, abdominal pain. Denies bright red blood per rectum. GENITOURINARY: Denies dysuria,  hematuria. ENDOCRINE: Denies nocturia or thyroid problems. HEMATOLOGIC AND LYMPHATIC: Denies easy bruising or bleeding. SKIN: Denies rash or lesion. MUSCULOSKELETAL: Denies pain in neck, back, shoulder, knees, hips or arthritic symptoms.  NEUROLOGIC: Denies paralysis, paresthesias.  PSYCHIATRIC: Denies anxiety or depressive symptoms.   VITAL SIGNS:  Blood pressure 118/70, pulse 83, temperature 98.7 F (37.1 C), temperature source Oral, resp. rate 18, height 5' 6"  (1.676 m), weight 64.638 kg (142 lb 8 oz), SpO2 98 %.  I/O:   No intake or output data in the 24 hours ending 08/22/15 1920  PHYSICAL EXAMINATION:  GENERAL:  59 y.o.-year-old patient lying in the bed with no acute distress.  EYES: Pupils equal, round, reactive to light and accommodation. No scleral icterus. Extraocular muscles intact.  HEENT: Head atraumatic, normocephalic. Oropharynx and nasopharynx clear.  NECK:  Supple, no jugular venous distention. No thyroid enlargement, no tenderness.  LUNGS: Normal breath sounds bilaterally, no wheezing, rales,rhonchi or crepitation. No use of accessory muscles of respiration.  CARDIOVASCULAR: S1, S2 normal. No murmurs, rubs, or gallops.  ABDOMEN: Soft, non-tender, non-distended. Bowel sounds present. No organomegaly or mass.  EXTREMITIES: No pedal edema, cyanosis, or clubbing.  NEUROLOGIC: Cranial nerves II through XII are intact. Muscle strength 5/5 in all extremities. Sensation intact. Gait not checked.  PSYCHIATRIC: The patient is alert and oriented x 3.  SKIN: No obvious rash, lesion, or ulcer.   DATA REVIEW:   CBC  Recent Labs Lab 08/16/15 0601  WBC 6.8  HGB 10.2*  HCT 30.9*  PLT 134*    Chemistries   Recent Labs Lab 08/16/15 0601  NA 133*  K 4.1  CL 109  CO2 21*  GLUCOSE 93  BUN 12  CREATININE 0.93  CALCIUM 7.9*    Cardiac Enzymes No results for input(s): TROPONINI in the last 168 hours.  Microbiology Results  Results for orders placed or performed  during the hospital encounter of 07/31/15  Body fluid culture     Status: None   Collection Time: 08/01/15  9:18 AM  Result Value Ref Range Status   Specimen Description PERITONEAL  Final   Special Requests NONE  Final   Gram Stain FEW WBC SEEN NO ORGANISMS SEEN   Final   Culture No growth aerobically or anaerobically.  Final   Report Status 08/05/2015 FINAL  Final    RADIOLOGY:  No results found.  EKG:   Orders placed or performed during the hospital encounter of 07/31/15  . EKG 12-Lead  . EKG 12-Lead      Management plans discussed with the patient, family and they are in agreement.  CODE STATUS:     Code Status Orders        Start     Ordered   08/15/15 0106  Full code   Continuous     08/15/15 0105  Advance Directive Documentation        Most Recent Value   Type of Advance Directive  Healthcare Power of Attorney, Living will   Pre-existing out of facility DNR order (yellow form or pink MOST form)     "MOST" Form in Place?        TOTAL TIME TAKING CARE OF THIS PATIENT: 45  minutes.    @[facility] @  on 08/22/2015 at 7:20 PM  Between 7am to 6pm - Pager - [phone]  After 6pm go to [email or web address] - password [email or web address] [facility]  [facility]  Office  [phone]  CC: Primary care physician; Sheral Flow, NP

## 2015-08-16 NOTE — Discharge Instructions (Signed)
Abdominal Pain, Adult Many things can cause abdominal pain. Usually, abdominal pain is not caused by a disease and will improve without treatment. It can often be observed and treated at home. Your health care provider will do a physical exam and possibly order blood tests and X-rays to help determine the seriousness of your pain. However, in many cases, more time must pass before a clear cause of the pain can be found. Before that point, your health care provider may not know if you need more testing or further treatment. HOME CARE INSTRUCTIONS Monitor your abdominal pain for any changes. The following actions may help to alleviate any discomfort you are experiencing:  Only take over-the-counter or prescription medicines as directed by your health care provider.  Do not take laxatives unless directed to do so by your health care provider.  Try a clear liquid diet (broth, tea, or water) as directed by your health care provider. Slowly move to a bland diet as tolerated. SEEK MEDICAL CARE IF:  You have unexplained abdominal pain.  You have abdominal pain associated with nausea or diarrhea.  You have pain when you urinate or have a bowel movement.  You experience abdominal pain that wakes you in the night.  You have abdominal pain that is worsened or improved by eating food.  You have abdominal pain that is worsened with eating fatty foods.  You have a fever. SEEK IMMEDIATE MEDICAL CARE IF:  Your pain does not go away within 2 hours.  You keep throwing up (vomiting).  Your pain is felt only in portions of the abdomen, such as the right side or the left lower portion of the abdomen.  You pass bloody or black tarry stools. MAKE SURE YOU:  Understand these instructions.  Will watch your condition.  Will get help right away if you are not doing well or get worse.   This information is not intended to replace advice given to you by your health care provider. Make sure you discuss  any questions you have with your health care provider.   Document Released: 06/06/2005 Document Revised: 05/18/2015 Document Reviewed: 05/06/2013 Elsevier Interactive Patient Education 2016 [employer].   Activity as tolerated Diet healthy heart, diabetic Follow-up with primary care physician in a week Follow-up with gastroenterology in 2 weeks

## 2015-08-16 NOTE — Plan of Care (Addendum)
Problem: Safety: Goal: Ability to remain free from injury will improve Outcome: Progressing Pt high fall risk. Calls for assistance. Pt ambulated around nurses station with one person assist without difficulty.   Problem: Pain Managment: Goal: General experience of comfort will improve Outcome: Progressing Pt c/o chronic upper abdominal pain. Morphine given 2 mg twice this shift with improvement. Zofran given for nausea, no emesis noted.     Diet advanced to full liquid. See previous documentation from off going nurse.

## 2015-08-16 NOTE — Progress Notes (Signed)
Pt is alert and oriented x 4, c/o abdominal pain improved with morphine, received scheduled MS contin, on room air, no bm throughout shift, up to bathroom with stand by assist, diet advanced to solid, tolerating diet, pt is d/c to home, appt scheduled for f/u on 12/7, pt is to scheduled an appt with gastro in 2 weeks. WBC normal, hgb stable. On amiodarone and eliquis for a fib. Pt reports understanding d/c instructions and has no further questions at this time, RX provided to patient.

## 2015-08-17 ENCOUNTER — Telehealth: Payer: Self-pay | Admitting: Primary Care

## 2015-08-17 ENCOUNTER — Telehealth: Payer: Self-pay | Admitting: *Deleted

## 2015-08-17 ENCOUNTER — Ambulatory Visit: Payer: Commercial Managed Care - HMO | Admitting: Primary Care

## 2015-08-17 NOTE — Telephone Encounter (Signed)
Noted  

## 2015-08-17 NOTE — Telephone Encounter (Signed)
Transitional care call attempted.  No answer, no voice mail to leave a message.  Patient is scheduled for follow up today at 1445.

## 2015-08-18 ENCOUNTER — Telehealth: Payer: Self-pay | Admitting: Primary Care

## 2015-08-18 NOTE — Telephone Encounter (Signed)
Pt did not come in for their appt on 08/17/15 for office visit. Please let me know if pt needs to be contacted immediately for follow up or no follow up needed. Best phone number to contact pt is [phone].

## 2015-08-18 NOTE — Telephone Encounter (Signed)
Yes, please have her follow up at her convenience to ensure she's transitioned home well from the hospital. Please do not charge her the no show fee. Thanks!

## 2015-08-22 NOTE — Telephone Encounter (Signed)
Pt states that she is doing okay. Still having stomach issues with keeping down food. She is eating a little bit at a time. She will call us back when she knows what her schedule looks like with holiday stuff this month.

## 2015-08-31 DIAGNOSIS — E785 Hyperlipidemia, unspecified: Secondary | ICD-10-CM | POA: Insufficient documentation

## 2015-08-31 DIAGNOSIS — I10 Essential (primary) hypertension: Secondary | ICD-10-CM | POA: Diagnosis not present

## 2015-08-31 DIAGNOSIS — Z7689 Persons encountering health services in other specified circumstances: Secondary | ICD-10-CM | POA: Insufficient documentation

## 2015-08-31 DIAGNOSIS — K7460 Unspecified cirrhosis of liver: Secondary | ICD-10-CM | POA: Diagnosis not present

## 2015-08-31 DIAGNOSIS — M5440 Lumbago with sciatica, unspecified side: Secondary | ICD-10-CM | POA: Diagnosis not present

## 2015-08-31 DIAGNOSIS — E119 Type 2 diabetes mellitus without complications: Secondary | ICD-10-CM | POA: Insufficient documentation

## 2015-08-31 DIAGNOSIS — I4891 Unspecified atrial fibrillation: Secondary | ICD-10-CM | POA: Diagnosis not present

## 2015-08-31 DIAGNOSIS — R112 Nausea with vomiting, unspecified: Secondary | ICD-10-CM | POA: Diagnosis not present

## 2015-08-31 DIAGNOSIS — Z794 Long term (current) use of insulin: Secondary | ICD-10-CM

## 2015-10-04 ENCOUNTER — Emergency Department (HOSPITAL_COMMUNITY): Payer: Commercial Managed Care - HMO

## 2015-10-04 ENCOUNTER — Observation Stay (HOSPITAL_COMMUNITY): Payer: Commercial Managed Care - HMO

## 2015-10-04 ENCOUNTER — Encounter (HOSPITAL_COMMUNITY): Payer: Self-pay | Admitting: *Deleted

## 2015-10-04 ENCOUNTER — Observation Stay (HOSPITAL_COMMUNITY)
Admission: EM | Admit: 2015-10-04 | Discharge: 2015-10-08 | Disposition: A | Discharge status: Home / Self Care | Payer: Commercial Managed Care - HMO | Attending: Oncology | Admitting: Oncology

## 2015-10-04 DIAGNOSIS — Z794 Long term (current) use of insulin: Secondary | ICD-10-CM | POA: Diagnosis not present

## 2015-10-04 DIAGNOSIS — R188 Other ascites: Secondary | ICD-10-CM | POA: Insufficient documentation

## 2015-10-04 DIAGNOSIS — G459 Transient cerebral ischemic attack, unspecified: Secondary | ICD-10-CM

## 2015-10-04 DIAGNOSIS — I951 Orthostatic hypotension: Secondary | ICD-10-CM | POA: Diagnosis not present

## 2015-10-04 DIAGNOSIS — E039 Hypothyroidism, unspecified: Secondary | ICD-10-CM | POA: Insufficient documentation

## 2015-10-04 DIAGNOSIS — I10 Essential (primary) hypertension: Secondary | ICD-10-CM | POA: Insufficient documentation

## 2015-10-04 DIAGNOSIS — R109 Unspecified abdominal pain: Secondary | ICD-10-CM | POA: Diagnosis not present

## 2015-10-04 DIAGNOSIS — E2740 Unspecified adrenocortical insufficiency: Secondary | ICD-10-CM | POA: Diagnosis not present

## 2015-10-04 DIAGNOSIS — E785 Hyperlipidemia, unspecified: Secondary | ICD-10-CM | POA: Diagnosis not present

## 2015-10-04 DIAGNOSIS — M6289 Other specified disorders of muscle: Secondary | ICD-10-CM | POA: Diagnosis not present

## 2015-10-04 DIAGNOSIS — I480 Paroxysmal atrial fibrillation: Secondary | ICD-10-CM | POA: Insufficient documentation

## 2015-10-04 DIAGNOSIS — Z79899 Other long term (current) drug therapy: Secondary | ICD-10-CM | POA: Diagnosis not present

## 2015-10-04 DIAGNOSIS — K219 Gastro-esophageal reflux disease without esophagitis: Secondary | ICD-10-CM | POA: Insufficient documentation

## 2015-10-04 DIAGNOSIS — R42 Dizziness and giddiness: Secondary | ICD-10-CM | POA: Diagnosis not present

## 2015-10-04 DIAGNOSIS — G894 Chronic pain syndrome: Secondary | ICD-10-CM | POA: Diagnosis not present

## 2015-10-04 DIAGNOSIS — R4781 Slurred speech: Principal | ICD-10-CM | POA: Insufficient documentation

## 2015-10-04 DIAGNOSIS — R479 Unspecified speech disturbances: Secondary | ICD-10-CM | POA: Diagnosis not present

## 2015-10-04 DIAGNOSIS — R269 Unspecified abnormalities of gait and mobility: Secondary | ICD-10-CM

## 2015-10-04 DIAGNOSIS — F329 Major depressive disorder, single episode, unspecified: Secondary | ICD-10-CM | POA: Diagnosis not present

## 2015-10-04 DIAGNOSIS — S0990XA Unspecified injury of head, initial encounter: Secondary | ICD-10-CM | POA: Diagnosis not present

## 2015-10-04 DIAGNOSIS — E109 Type 1 diabetes mellitus without complications: Secondary | ICD-10-CM | POA: Diagnosis not present

## 2015-10-04 DIAGNOSIS — R05 Cough: Secondary | ICD-10-CM | POA: Diagnosis not present

## 2015-10-04 DIAGNOSIS — R221 Localized swelling, mass and lump, neck: Secondary | ICD-10-CM | POA: Diagnosis not present

## 2015-10-04 DIAGNOSIS — Z79891 Long term (current) use of opiate analgesic: Secondary | ICD-10-CM

## 2015-10-04 DIAGNOSIS — Z8673 Personal history of transient ischemic attack (TIA), and cerebral infarction without residual deficits: Secondary | ICD-10-CM | POA: Insufficient documentation

## 2015-10-04 DIAGNOSIS — R531 Weakness: Secondary | ICD-10-CM | POA: Insufficient documentation

## 2015-10-04 DIAGNOSIS — E43 Unspecified severe protein-calorie malnutrition: Secondary | ICD-10-CM

## 2015-10-04 DIAGNOSIS — R079 Chest pain, unspecified: Secondary | ICD-10-CM | POA: Diagnosis not present

## 2015-10-04 DIAGNOSIS — I6789 Other cerebrovascular disease: Secondary | ICD-10-CM | POA: Diagnosis not present

## 2015-10-04 DIAGNOSIS — K7469 Other cirrhosis of liver: Secondary | ICD-10-CM | POA: Diagnosis present

## 2015-10-04 DIAGNOSIS — E876 Hypokalemia: Secondary | ICD-10-CM | POA: Diagnosis not present

## 2015-10-04 DIAGNOSIS — E119 Type 2 diabetes mellitus without complications: Secondary | ICD-10-CM

## 2015-10-04 DIAGNOSIS — D61818 Other pancytopenia: Secondary | ICD-10-CM | POA: Diagnosis not present

## 2015-10-04 DIAGNOSIS — I639 Cerebral infarction, unspecified: Secondary | ICD-10-CM | POA: Diagnosis present

## 2015-10-04 DIAGNOSIS — Z8542 Personal history of malignant neoplasm of other parts of uterus: Secondary | ICD-10-CM | POA: Diagnosis not present

## 2015-10-04 DIAGNOSIS — I959 Hypotension, unspecified: Secondary | ICD-10-CM | POA: Diagnosis present

## 2015-10-04 DIAGNOSIS — E1165 Type 2 diabetes mellitus with hyperglycemia: Secondary | ICD-10-CM

## 2015-10-04 DIAGNOSIS — I63311 Cerebral infarction due to thrombosis of right middle cerebral artery: Secondary | ICD-10-CM

## 2015-10-04 DIAGNOSIS — R001 Bradycardia, unspecified: Secondary | ICD-10-CM | POA: Insufficient documentation

## 2015-10-04 DIAGNOSIS — Z9114 Patient's other noncompliance with medication regimen: Secondary | ICD-10-CM | POA: Insufficient documentation

## 2015-10-04 DIAGNOSIS — H538 Other visual disturbances: Secondary | ICD-10-CM | POA: Insufficient documentation

## 2015-10-04 DIAGNOSIS — R51 Headache: Secondary | ICD-10-CM | POA: Diagnosis not present

## 2015-10-04 DIAGNOSIS — F341 Dysthymic disorder: Secondary | ICD-10-CM | POA: Diagnosis present

## 2015-10-04 DIAGNOSIS — R9431 Abnormal electrocardiogram [ECG] [EKG]: Secondary | ICD-10-CM | POA: Diagnosis present

## 2015-10-04 DIAGNOSIS — R4701 Aphasia: Secondary | ICD-10-CM

## 2015-10-04 LAB — HEPATIC FUNCTION PANEL
ALBUMIN: 3.2 g/dL — AB (ref 3.5–5.0)
ALT: 43 U/L (ref 14–54)
AST: 42 U/L — AB (ref 15–41)
Alkaline Phosphatase: 117 U/L (ref 38–126)
BILIRUBIN TOTAL: 0.7 mg/dL (ref 0.3–1.2)
Bilirubin, Direct: 0.2 mg/dL (ref 0.1–0.5)
Indirect Bilirubin: 0.5 mg/dL (ref 0.3–0.9)
TOTAL PROTEIN: 6.2 g/dL — AB (ref 6.5–8.1)

## 2015-10-04 LAB — CBC WITH DIFFERENTIAL/PLATELET
BASOS ABS: 0 10*3/uL (ref 0.0–0.1)
Basophils Relative: 1 %
Eosinophils Absolute: 0.1 10*3/uL (ref 0.0–0.7)
Eosinophils Relative: 1 %
HEMATOCRIT: 32.1 % — AB (ref 36.0–46.0)
Hemoglobin: 11 g/dL — ABNORMAL LOW (ref 12.0–15.0)
LYMPHS ABS: 1.1 10*3/uL (ref 0.7–4.0)
LYMPHS PCT: 26 %
MCH: 28.7 pg (ref 26.0–34.0)
MCHC: 34.3 g/dL (ref 30.0–36.0)
MCV: 83.8 fL (ref 78.0–100.0)
MONO ABS: 0.4 10*3/uL (ref 0.1–1.0)
Monocytes Relative: 10 %
NEUTROS ABS: 2.6 10*3/uL (ref 1.7–7.7)
Neutrophils Relative %: 63 %
Platelets: 110 10*3/uL — ABNORMAL LOW (ref 150–400)
RBC: 3.83 MIL/uL — ABNORMAL LOW (ref 3.87–5.11)
RDW: 15.5 % (ref 11.5–15.5)
WBC: 4.1 10*3/uL (ref 4.0–10.5)

## 2015-10-04 LAB — GLUCOSE, CAPILLARY: Glucose-Capillary: 282 mg/dL — ABNORMAL HIGH (ref 65–99)

## 2015-10-04 LAB — TROPONIN I

## 2015-10-04 LAB — PROTIME-INR
INR: 1.35 (ref 0.00–1.49)
Prothrombin Time: 16.8 seconds — ABNORMAL HIGH (ref 11.6–15.2)

## 2015-10-04 LAB — I-STAT TROPONIN, ED: TROPONIN I, POC: 0.01 ng/mL (ref 0.00–0.08)

## 2015-10-04 LAB — BASIC METABOLIC PANEL
ANION GAP: 8 (ref 5–15)
BUN: 10 mg/dL (ref 6–20)
CHLORIDE: 104 mmol/L (ref 101–111)
CO2: 26 mmol/L (ref 22–32)
Calcium: 9.1 mg/dL (ref 8.9–10.3)
Creatinine, Ser: 0.91 mg/dL (ref 0.44–1.00)
GFR calc Af Amer: >60 mL/min (ref >60)
GLUCOSE: 352 mg/dL — AB (ref 65–99)
POTASSIUM: 3.5 mmol/L (ref 3.5–5.1)
Sodium: 138 mmol/L (ref 135–145)

## 2015-10-04 LAB — T4, FREE: Free T4: 0.62 ng/dL (ref 0.61–1.12)

## 2015-10-04 LAB — AMMONIA: AMMONIA: 19 umol/L (ref 9–35)

## 2015-10-04 LAB — CBG MONITORING, ED: Glucose-Capillary: 306 mg/dL — ABNORMAL HIGH (ref 65–99)

## 2015-10-04 LAB — TSH: TSH: 22.184 u[IU]/mL — AB (ref 0.350–4.500)

## 2015-10-04 LAB — MAGNESIUM: MAGNESIUM: 1.7 mg/dL (ref 1.7–2.4)

## 2015-10-04 MED ORDER — FAMOTIDINE 20 MG PO TABS
20.0000 mg | ORAL_TABLET | Freq: Two times a day (BID) | ORAL | Status: DC
  Administered 2015-10-04 – 2015-10-08 (×8): 20 mg via ORAL
  Filled 2015-10-04: qty 1
  Filled 2015-10-04: qty 2
  Filled 2015-10-04 (×6): qty 1

## 2015-10-04 MED ORDER — DICYCLOMINE HCL 10 MG PO CAPS
10.0000 mg | ORAL_CAPSULE | Freq: Three times a day (TID) | ORAL | Status: DC
  Administered 2015-10-04 – 2015-10-08 (×12): 10 mg via ORAL
  Filled 2015-10-04 (×21): qty 1

## 2015-10-04 MED ORDER — INSULIN DETEMIR 100 UNIT/ML ~~LOC~~ SOLN
10.0000 [IU] | Freq: Every day | SUBCUTANEOUS | Status: DC
  Administered 2015-10-04 – 2015-10-05 (×2): 10 [IU] via SUBCUTANEOUS
  Filled 2015-10-04 (×2): qty 0.1

## 2015-10-04 MED ORDER — MORPHINE SULFATE ER 60 MG PO TBCR
60.0000 mg | EXTENDED_RELEASE_TABLET | Freq: Two times a day (BID) | ORAL | Status: DC
  Administered 2015-10-05: 60 mg via ORAL
  Filled 2015-10-04: qty 1

## 2015-10-04 MED ORDER — MORPHINE SULFATE ER 60 MG PO TBCR: 60.0000 mg | EXTENDED_RELEASE_TABLET | Freq: Two times a day (BID) | ORAL | Status: DC

## 2015-10-04 MED ORDER — INSULIN ASPART 100 UNIT/ML ~~LOC~~ SOLN
0.0000 [IU] | Freq: Three times a day (TID) | SUBCUTANEOUS | Status: DC
  Administered 2015-10-05: 1 [IU] via SUBCUTANEOUS
  Administered 2015-10-05: 3 [IU] via SUBCUTANEOUS
  Administered 2015-10-05 – 2015-10-06 (×2): 2 [IU] via SUBCUTANEOUS
  Administered 2015-10-06: 3 [IU] via SUBCUTANEOUS
  Administered 2015-10-07: 5 [IU] via SUBCUTANEOUS
  Administered 2015-10-07: 2 [IU] via SUBCUTANEOUS
  Administered 2015-10-07: 3 [IU] via SUBCUTANEOUS
  Administered 2015-10-08: 1 [IU] via SUBCUTANEOUS
  Administered 2015-10-08: 5 [IU] via SUBCUTANEOUS

## 2015-10-04 MED ORDER — LEVOTHYROXINE SODIUM 75 MCG PO TABS
75.0000 ug | ORAL_TABLET | Freq: Every day | ORAL | Status: DC
  Administered 2015-10-05 – 2015-10-08 (×4): 75 ug via ORAL
  Filled 2015-10-04 (×4): qty 1

## 2015-10-04 MED ORDER — PANTOPRAZOLE SODIUM 40 MG PO TBEC
40.0000 mg | DELAYED_RELEASE_TABLET | Freq: Two times a day (BID) | ORAL | Status: DC
  Administered 2015-10-04 – 2015-10-08 (×8): 40 mg via ORAL
  Filled 2015-10-04 (×8): qty 1

## 2015-10-04 MED ORDER — RIFAXIMIN 550 MG PO TABS
550.0000 mg | ORAL_TABLET | Freq: Two times a day (BID) | ORAL | Status: DC
  Administered 2015-10-04 – 2015-10-08 (×8): 550 mg via ORAL
  Filled 2015-10-04 (×8): qty 1

## 2015-10-04 MED ORDER — OXYCODONE-ACETAMINOPHEN 5-325 MG PO TABS
1.0000 | ORAL_TABLET | Freq: Four times a day (QID) | ORAL | Status: DC | PRN
  Administered 2015-10-04 – 2015-10-06 (×4): 1 via ORAL
  Filled 2015-10-04 (×4): qty 1

## 2015-10-04 MED ORDER — ONDANSETRON HCL 4 MG PO TABS
4.0000 mg | ORAL_TABLET | Freq: Every day | ORAL | Status: DC | PRN
  Administered 2015-10-05 (×2): 4 mg via ORAL
  Filled 2015-10-04 (×2): qty 1

## 2015-10-04 MED ORDER — LISINOPRIL 5 MG PO TABS: 5.0000 mg | ORAL_TABLET | Freq: Two times a day (BID) | ORAL | Status: DC

## 2015-10-04 MED ORDER — ATORVASTATIN CALCIUM 40 MG PO TABS
40.0000 mg | ORAL_TABLET | Freq: Every day | ORAL | Status: DC
  Administered 2015-10-05 – 2015-10-07 (×3): 40 mg via ORAL
  Filled 2015-10-04 (×3): qty 1

## 2015-10-04 MED ORDER — SODIUM CHLORIDE 0.9% FLUSH
3.0000 mL | Freq: Two times a day (BID) | INTRAVENOUS | Status: DC
  Administered 2015-10-04 – 2015-10-08 (×4): 3 mL via INTRAVENOUS

## 2015-10-04 MED ORDER — APIXABAN 5 MG PO TABS
5.0000 mg | ORAL_TABLET | Freq: Two times a day (BID) | ORAL | Status: DC
  Administered 2015-10-04 – 2015-10-08 (×8): 5 mg via ORAL
  Filled 2015-10-04 (×8): qty 1

## 2015-10-04 MED ORDER — CITALOPRAM HYDROBROMIDE 20 MG PO TABS: 20.0000 mg | ORAL_TABLET | Freq: Every day | ORAL | Status: DC

## 2015-10-04 MED ORDER — SENNA 8.6 MG PO TABS
1.0000 | ORAL_TABLET | Freq: Two times a day (BID) | ORAL | Status: DC
  Administered 2015-10-04 – 2015-10-08 (×8): 8.6 mg via ORAL
  Filled 2015-10-04 (×8): qty 1

## 2015-10-04 NOTE — ED Notes (Signed)
Pt arrives from home via [facility]. Pt states she began feeling dizzy 4 days ago and had a fall 2 days ago and hit her head. Pt is on eliquis from a previous CVA.  Pt states she has continued to worsen. EMS states upon arrival pt had no palpable radial pulses and her speech became much more slurred en route. Pt is having expressive aphasia, delay and slurred speech upon arrival. Pt states she has been nauseous for 3 days and hasn't had anything to eat. Pt has left sided weakness and a left sided facial droop as well.

## 2015-10-04 NOTE — Progress Notes (Signed)
IM teaching service recommended telemetry setting for HR 35-45. Patient is asymptomatic, will continue to monitor. [name], [place] 10/04/2015 2031

## 2015-10-04 NOTE — Consult Note (Signed)
Requesting Physician: Dr. Audie Pinto, ER    Reason fo consultation:   Dizziness, speech problems, s/p fall, evaluate for acute stroke  HPI:                                                                                                                                         [name] is an 60 y.o. female patient who presented with symptoms of dizziness starting about 4 days ago, and had a fall 2 days ago, hit her head during the fall. She has extensive medical history including history of paroxysmal atrial fibrillation on Eliquis, prior TIA, cirrhosis, type 1 diabetes mellitus, uterine cancer s/p hysterectomy, hypertension and hypothyroidism who presents with left-sided weakness, confusion, word-finding difficulties and bradycardia . She reports decreased appetite and fluid intake and  Dizziness for the past 4 days. Two days ago, she reports having fallen in her bedroom after rising from the bed and hitting her head on the bedside table. She had sharp head pain after this, but no other symptoms. Yesterday, she reports developing blurred vision and left-sided weakness. She also describes some chest pain this morning. Today, she developed some speech problems, which prompted her husband to call 911. She believes she has been taking all her medications as prescribed, though does not know names or doses. Her husband does not believe she has been taking her medications regularly, but rather when she feels like taking them.   Date last known well:  > 4 days ago Time last known well:  Unknown tPA Given: No: Unknown time of onset  Stroke Risk Factors - atrial fibrillation  Past Medical History  Diagnosis Date  . Type 1 diabetes ([place])   . Hypothyroid   . Degenerative disk disease   . Stomach ulcer   . Diverticulitis   . Syncope 01/2015  . Anxiety   . GERD (gastroesophageal reflux disease)   . History of hiatal hernia   . Cancer (HCC)     HX OF CANCER OF UTERUS   . TIA (transient ischemic attack)  02/2015  . Hypertension   . Pancreatitis   . PAF (paroxysmal atrial fibrillation) ([place]) 03/2015    a. new onset 03/2015 in setting of intractable N/V; b. on Eliquis 5 mg bid; c. CHADSVASc 4 (DM, TIA x 2, female)  . Intussusception intestine ([place]) 05/2015    Past Surgical History  Procedure Laterality Date  . Hernia repair    . Abdominal hysterectomy    . Cholecystectomy    . Esophagogastroduodenoscopy N/A 04/04/2015    Procedure: ESOPHAGOGASTRODUODENOSCOPY (EGD);  Surgeon: Hulen Luster, MD;  Location: [facility] ENDOSCOPY;  Service: Endoscopy;  Laterality: N/A;    Family History  Problem Relation Age of Onset  . Hypertension Mother   . CAD Sister     Social History:  reports that she has never smoked. She has never used smokeless tobacco. She reports that  she does not drink alcohol or use illicit drugs.  Allergies:  Allergies  Allergen Reactions  . Erythromycin Base Other (See Comments)    Reaction:  Fever   . Rosiglitazone Maleate Swelling  . Codeine Sulfate Rash  . Tetanus-Diphtheria Toxoids Td Rash and Other (See Comments)    Reaction:  Fever     Medications:                                                                                                                        No current facility-administered medications for this encounter.  Current outpatient prescriptions:  .  amiodarone (PACERONE) 200 MG tablet, Take 1 tablet (200 mg total) by mouth daily., Disp: 30 tablet, Rfl: 0 .  apixaban (ELIQUIS) 5 MG TABS tablet, Take 1 tablet (5 mg total) by mouth 2 (two) times daily., Disp: 60 tablet, Rfl: 0 .  atorvastatin (LIPITOR) 40 MG tablet, Take 1 tablet (40 mg total) by mouth daily at 6 PM., Disp: 30 tablet, Rfl: 0 .  citalopram (CELEXA) 20 MG tablet, Take 20 mg by mouth daily., Disp: , Rfl:  .  dicyclomine (BENTYL) 10 MG capsule, Take 1 capsule by mouth 4 (four) times daily - after meals and at bedtime. , Disp: , Rfl:  .  insulin detemir (LEVEMIR) 100 UNIT/ML injection, Inject  0.17 mLs (17 Units total) into the skin daily., Disp: 10 mL, Rfl: 11 .  levothyroxine (SYNTHROID, LEVOTHROID) 75 MCG tablet, Take 1 tablet (75 mcg total) by mouth daily before breakfast., Disp: 30 tablet, Rfl: 5 .  lisinopril (PRINIVIL,ZESTRIL) 10 MG tablet, Take 0.5 tablets (5 mg total) by mouth 2 (two) times daily., Disp: 60 tablet, Rfl: 6 .  metoprolol succinate (TOPROL-XL) 25 MG 24 hr tablet, Take 1 tablet (25 mg total) by mouth daily., Disp: 30 tablet, Rfl: 0 .  morphine (MS CONTIN) 60 MG 12 hr tablet, Take 1 tablet (60 mg total) by mouth 2 (two) times daily., Disp: 60 tablet, Rfl: 0 .  ondansetron (ZOFRAN) 4 MG tablet, Take 1 tablet (4 mg total) by mouth daily as needed for nausea or vomiting., Disp: 20 tablet, Rfl: 0 .  oxyCODONE-acetaminophen (ROXICET) 5-325 MG tablet, Take 1 tablet by mouth every 6 (six) hours as needed., Disp: 15 tablet, Rfl: 0 .  pantoprazole (PROTONIX) 40 MG tablet, Take 1 tablet (40 mg total) by mouth 2 (two) times daily., Disp: 60 tablet, Rfl: 0 .  psyllium (METAMUCIL) 58.6 % packet, Take 1 packet by mouth daily as needed (constipation). , Disp: , Rfl:  .  ranitidine (ZANTAC) 150 MG capsule, Take 1 capsule (150 mg total) by mouth 2 (two) times daily., Disp: 28 capsule, Rfl: 0 .  rifaximin (XIFAXAN) 550 MG TABS tablet, Take 1 tablet (550 mg total) by mouth 2 (two) times daily., Disp: 60 tablet, Rfl: 6 .  tiZANidine (ZANAFLEX) 4 MG tablet, Take 4 mg by mouth every 6 (six) hours as needed for muscle spasms. , Disp: , Rfl:  .  zolpidem (AMBIEN) 10 MG tablet, Take 10 mg by mouth at bedtime., Disp: , Rfl:    ROS:                                                                                                                                       History obtained from the patient  General ROS: negative for - chills, fatigue, fever, night sweats, weight gain or weight loss Psychological ROS: negative for - behavioral disorder, hallucinations, memory difficulties, mood swings  or suicidal ideation Ophthalmic ROS: negative for - blurry vision, double vision, eye pain or loss of vision ENT ROS: negative for - epistaxis, nasal discharge, oral lesions, sore throat, tinnitus or vertigo Allergy and Immunology ROS: negative for - hives or itchy/watery eyes Hematological and Lymphatic ROS: negative for - bleeding problems, bruising or swollen lymph nodes Endocrine ROS: negative for - galactorrhea, hair pattern changes, polydipsia/polyuria or temperature intolerance Respiratory ROS: negative for - cough, hemoptysis, shortness of breath or wheezing Cardiovascular ROS: negative for - chest pain, dyspnea on exertion, edema or irregular heartbeat Gastrointestinal ROS: negative for - abdominal pain, diarrhea, hematemesis, nausea/vomiting or stool incontinence Genito-Urinary ROS: negative for - dysuria, hematuria, incontinence or urinary frequency/urgency Musculoskeletal ROS: negative for - joint swelling or muscular weakness Neurological ROS: as noted in HPI Dermatological ROS: negative for rash and skin lesion changes  Neurologic Examination:                                                                                                      Blood pressure 188/89, pulse 42, temperature 97.7 F (36.5 C), temperature source Oral, resp. rate 16, height 5' 3"  (1.6 m), weight 58.968 kg (130 lb), SpO2 99 %.  Evaluation of higher integrative functions including: Level of alertness: Alert,  Oriented to time, place and person Speech: fluent, no evidence of dysarthria or aphasia noted.  Test the following cranial nerves: 2-12 grossly intact Motor examination: Mild subjective giveaway in the left upper and lower extremities full 5/5 motor strength in right  extremities Examination of sensation : Reports subjectively reduced sensation in the left upper and lower extremities  Examination of deep tendon reflexes: 2+, normal and symmetric in all extremities, normal plantars bilaterally Test  coordination: Normal finger nose testing, with no evidence of limb appendicular ataxia or abnormal involuntary movements or tremors noted.  Gait: Deferred   Lab Results: Basic Metabolic Panel:  Recent Labs Lab 10/04/15 1359  NA 138  K 3.5  CL 104  CO2 26  GLUCOSE 352*  BUN 10  CREATININE 0.91  CALCIUM 9.1    Liver Function Tests: No results for input(s): AST, ALT, ALKPHOS, BILITOT, PROT, ALBUMIN in the last 168 hours. No results for input(s): LIPASE, AMYLASE in the last 168 hours. No results for input(s): AMMONIA in the last 168 hours.  CBC:  Recent Labs Lab 10/04/15 1359  WBC 4.1  NEUTROABS 2.6  HGB 11.0*  HCT 32.1*  MCV 83.8  PLT PENDING    Cardiac Enzymes: No results for input(s): CKTOTAL, CKMB, CKMBINDEX, TROPONINI in the last 168 hours.  Lipid Panel: No results for input(s): CHOL, TRIG, HDL, CHOLHDL, VLDL, LDLCALC in the last 168 hours.  CBG:  Recent Labs Lab 10/04/15 1356  GLUCAP 306*    Microbiology: Results for orders placed or performed during the hospital encounter of 07/31/15  Body fluid culture     Status: None   Collection Time: 08/01/15  9:18 AM  Result Value Ref Range Status   Specimen Description PERITONEAL  Final   Special Requests NONE  Final   Gram Stain FEW WBC SEEN NO ORGANISMS SEEN   Final   Culture No growth aerobically or anaerobically.  Final   Report Status 08/05/2015 FINAL  Final     Imaging: Ct Head Wo Contrast  10/04/2015  CLINICAL DATA:  Dizziness for 4 days, fell 2 days ago, RIGHT parietal headache which worsened in route to hospital, slurred speech, limb ataxia, LEFT facial droop, stroke symptoms, history diabetes mellitus, TIA, hypertension, paroxysmal atrial fibrillation, uterine cancer, stroke EXAM: CT HEAD WITHOUT CONTRAST TECHNIQUE: Contiguous axial images were obtained from the base of the skull through the vertex without intravenous contrast. COMPARISON:  07/06/2015 FINDINGS: Minimal age-related atrophy.  Normal ventricular morphology. No midline shift or mass effect. Otherwise normal appearance of brain parenchyma. No intracranial hemorrhage, mass lesion or evidence acute infarction. No extra-axial fluid collections. Sinuses clear and bones unremarkable. IMPRESSION: No acute intracranial abnormalities. Electronically Signed   By: Lavonia Dana M.D.   On: 10/04/2015 13:52   Dg Chest Portable 1 View  10/04/2015  CLINICAL DATA:  Slurred speech, weakness cough on the right side EXAM: PORTABLE CHEST 1 VIEW COMPARISON:  07/31/2015 FINDINGS: The heart size and mediastinal contours are within normal limits. Both lungs are clear. The visualized skeletal structures are unremarkable. IMPRESSION: No active disease. Electronically Signed   By: Kathreen Devoid   On: 10/04/2015 13:36    Assessment and plan:   [name] is an 60 y.o. female patient who presented with  Symptoms of dizziness,  Status post fall, subjective weakness in the left upper and lower extremities, and intermittent slurred speech symptoms.  Neurological examination is inconsistent,  No dysarthria or aphasia noted some mild giveaway on the left side with subjective left-sided sensory loss noted. MRI of the brain and C-spine completed in the ER,  which showed no acute pathology to explain her symptoms.  At this time, recommend to continue her home medications including eliquis for atrial fibrillation. Recommend physical therapy for gait and balance training and falls prevention. Reviewed MRI images with the patient, answered several of her questions .

## 2015-10-04 NOTE — ED Notes (Signed)
Attempted report to [facility].

## 2015-10-04 NOTE — ED Notes (Signed)
Called phlebotomy to collect blood.

## 2015-10-04 NOTE — ED Provider Notes (Signed)
CSN: [id number]     Arrival date & time 10/04/15  1317 History   First MD Initiated Contact with Patient 10/04/15 1319     Chief Complaint  Patient presents with  . Cerebrovascular Accident      HPI Pt arrives from home via [facility]. Pt states she began feeling dizzy 4 days ago and had a fall 2 days ago and hit her head. Pt is on eliquis from a previous CVA. Pt states she has continued to worsen. EMS states upon arrival pt had no palpable radial pulses and her speech became much more slurred en route. Pt is having expressive aphasia, delay and slurred speech upon arrival. Pt states she has been nauseous for 3 days and hasn't had anything to eat. Pt has left sided weakness and a left sided facial droop as well. Past Medical History  Diagnosis Date  . Type 1 diabetes ([place])   . Hypothyroid   . Degenerative disk disease   . Stomach ulcer   . Diverticulitis   . Syncope 01/2015  . Anxiety   . GERD (gastroesophageal reflux disease)   . History of hiatal hernia   . Cancer (HCC)     HX OF CANCER OF UTERUS   . TIA (transient ischemic attack) 02/2015  . Hypertension   . Pancreatitis   . PAF (paroxysmal atrial fibrillation) ([place]) 03/2015    a. new onset 03/2015 in setting of intractable N/V; b. on Eliquis 5 mg bid; c. CHADSVASc 4 (DM, TIA x 2, female)  . Intussusception intestine ([place]) 05/2015   Past Surgical History  Procedure Laterality Date  . Hernia repair    . Abdominal hysterectomy    . Cholecystectomy    . Esophagogastroduodenoscopy N/A 04/04/2015    Procedure: ESOPHAGOGASTRODUODENOSCOPY (EGD);  Surgeon: Hulen Luster, MD;  Location: [facility] ENDOSCOPY;  Service: Endoscopy;  Laterality: N/A;   Family History  Problem Relation Age of Onset  . Hypertension Mother   . CAD Sister    Social History  Substance Use Topics  . Smoking status: Never Smoker   . Smokeless tobacco: Never Used  . Alcohol Use: No   OB History    No data available     Review of Systems  Unable to perform ROS:  Acuity of condition      Allergies  Erythromycin base; Rosiglitazone maleate; Codeine sulfate; and Tetanus-diphtheria toxoids td  Home Medications   Prior to Admission medications   Medication Sig Start Date End Date Taking? Authorizing Provider  amiodarone (PACERONE) 200 MG tablet Take 1 tablet (200 mg total) by mouth daily. 05/31/15   Geradine Girt, DO  apixaban (ELIQUIS) 5 MG TABS tablet Take 1 tablet (5 mg total) by mouth 2 (two) times daily. 04/07/15   Bettey Costa, MD  atorvastatin (LIPITOR) 40 MG tablet Take 1 tablet (40 mg total) by mouth daily at 6 PM. 02/17/15   Bettey Costa, MD  citalopram (CELEXA) 20 MG tablet Take 20 mg by mouth daily.    Historical Provider, MD  dicyclomine (BENTYL) 10 MG capsule Take 1 capsule by mouth 4 (four) times daily - after meals and at bedtime.  03/11/15   Historical Provider, MD  insulin detemir (LEVEMIR) 100 UNIT/ML injection Inject 0.17 mLs (17 Units total) into the skin daily. 07/11/15   Theodoro Grist, MD  levothyroxine (SYNTHROID, LEVOTHROID) 75 MCG tablet Take 1 tablet (75 mcg total) by mouth daily before breakfast. 08/06/15   Theodoro Grist, MD  lisinopril (PRINIVIL,ZESTRIL) 10 MG tablet Take 0.5  tablets (5 mg total) by mouth 2 (two) times daily. 07/11/15   Theodoro Grist, MD  metoprolol succinate (TOPROL-XL) 25 MG 24 hr tablet Take 1 tablet (25 mg total) by mouth daily. 06/14/15   [name], NP  morphine (MS CONTIN) 60 MG 12 hr tablet Take 1 tablet (60 mg total) by mouth 2 (two) times daily. 07/11/15   Theodoro Grist, MD  ondansetron (ZOFRAN) 4 MG tablet Take 1 tablet (4 mg total) by mouth daily as needed for nausea or vomiting. 08/16/15   Nicholes Mango, MD  oxyCODONE-acetaminophen (ROXICET) 5-325 MG tablet Take 1 tablet by mouth every 6 (six) hours as needed. 08/16/15   Nicholes Mango, MD  pantoprazole (PROTONIX) 40 MG tablet Take 1 tablet (40 mg total) by mouth 2 (two) times daily. 04/07/15   Sital Mody, MD  psyllium (METAMUCIL) 58.6 % packet Take 1  packet by mouth daily as needed (constipation).     Historical Provider, MD  ranitidine (ZANTAC) 150 MG capsule Take 1 capsule (150 mg total) by mouth 2 (two) times daily. 07/03/15   Carrie Mew, MD  rifaximin (XIFAXAN) 550 MG TABS tablet Take 1 tablet (550 mg total) by mouth 2 (two) times daily. 07/11/15   Theodoro Grist, MD  tiZANidine (ZANAFLEX) 4 MG tablet Take 4 mg by mouth every 6 (six) hours as needed for muscle spasms.     Historical Provider, MD  zolpidem (AMBIEN) 10 MG tablet Take 10 mg by mouth at bedtime.    Historical Provider, MD   BP 188/89 mmHg  Pulse 42  Temp(Src) 97.7 F (36.5 C) (Oral)  Resp 16  Ht 5' 3"  (1.6 m)  Wt 130 lb (58.968 kg)  BMI 23.03 kg/m2  SpO2 99% Physical Exam  Constitutional: She is oriented to person, place, and time. She appears well-developed and well-nourished. No distress.  HENT:  Head: Normocephalic and atraumatic.  Eyes: Pupils are equal, round, and reactive to light.  Neck: Normal range of motion.  Cardiovascular: Normal rate and intact distal pulses.   Pulmonary/Chest: No respiratory distress.  Abdominal: Normal appearance. She exhibits no distension.  Musculoskeletal: Normal range of motion.  Neurological: She is alert and oriented to person, place, and time. A cranial nerve deficit is present. She exhibits abnormal muscle tone. GCS eye subscore is 4. GCS verbal subscore is 5. GCS motor subscore is 6.  Skin: Skin is warm and dry. No rash noted.  Psychiatric: She has a normal mood and affect. Her behavior is normal.  Nursing note and vitals reviewed.   ED Course  Procedures (including critical care time) CRITICAL CARE Performed by: Leonard Schwartz L Total critical care time: 30 minutes Critical care time was exclusive of separately billable procedures and treating other patients. Critical care was necessary to treat or prevent imminent or life-threatening deterioration. Critical care was time spent personally by me on the following  activities: development of treatment plan with patient and/or surrogate as well as nursing, discussions with consultants, evaluation of patient's response to treatment, examination of patient, obtaining history from patient or surrogate, ordering and performing treatments and interventions, ordering and review of laboratory studies, ordering and review of radiographic studies, pulse oximetry and re-evaluation of patient's condition. [place] METABOLIC PANEL - Abnormal; Notable for the following:    Glucose, Bld 352 (*)    All other components within normal limits  CBC WITH DIFFERENTIAL/PLATELET - Abnormal; Notable for the following:    RBC 3.83 (*)    Hemoglobin  11.0 (*)    HCT 32.1 (*)    Platelets 110 (*)    All other components within normal limits  PROTIME-INR - Abnormal; Notable for the following:    Prothrombin Time 16.8 (*)    All other components within normal limits  CBG MONITORING, ED - Abnormal; Notable for the following:    Glucose-Capillary 306 (*)    All other components within normal limits  I-STAT TROPOININ, ED    Imaging Review Ct Head Wo Contrast  10/04/2015  CLINICAL DATA:  Dizziness for 4 days, fell 2 days ago, RIGHT parietal headache which worsened in route to hospital, slurred speech, limb ataxia, LEFT facial droop, stroke symptoms, history diabetes mellitus, TIA, hypertension, paroxysmal atrial fibrillation, uterine cancer, stroke EXAM: CT HEAD WITHOUT CONTRAST TECHNIQUE: Contiguous axial images were obtained from the base of the skull through the vertex without intravenous contrast. COMPARISON:  07/06/2015 FINDINGS: Minimal age-related atrophy. Normal ventricular morphology. No midline shift or mass effect. Otherwise normal appearance of brain parenchyma. No intracranial hemorrhage, mass lesion or evidence acute infarction. No extra-axial fluid collections. Sinuses clear and bones unremarkable. IMPRESSION: No acute intracranial  abnormalities. Electronically Signed   By: Lavonia Dana M.D.   On: 10/04/2015 13:52   Mr Brain Wo Contrast  10/04/2015  CLINICAL DATA:  60 year old female with dizziness for 4 days, fall 2 days ago with head injury. Worsening symptoms. Initial encounter. EXAM: MRI HEAD WITHOUT CONTRAST TECHNIQUE: Multiplanar, multiecho pulse sequences of the brain and surrounding structures were obtained without intravenous contrast. COMPARISON:  Head CT without contrast 1339 hours today and earlier. Brain MRI 05/20/2015 and earlier. FINDINGS: Major intracranial vascular flow voids are stable and within normal limits. No restricted diffusion to suggest acute infarction. No midline shift, mass effect, evidence of mass lesion, ventriculomegaly, extra-axial collection or acute intracranial hemorrhage. Cervicomedullary junction and pituitary are within normal limits. Negative visualized cervical spine. Stable gray and white matter signal throughout the brain. Scattered mild for age nonspecific mostly subcortical white matter T2 and FLAIR hyperintensity. No cortical encephalomalacia or chronic cerebral blood products. Visible internal auditory structures appear normal. Trace mastoid fluid is unchanged. Negative nasopharynx. Trace ethmoid sinus mucosal thickening is unchanged. Orbit and scalp soft tissues appear stable and within normal limits. Normal bone marrow signal. IMPRESSION: 1.  No acute intracranial abnormality. 2. Stable noncontrast MRI appearance of the brain since September. Mild for age nonspecific cerebral white matter signal changes most commonly due to chronic small vessel disease. 3. Stable mild mastoid effusions, most often postinflammatory. Electronically Signed   By: Genevie Ann M.D.   On: 10/04/2015 15:50   Dg Chest Portable 1 View  10/04/2015  CLINICAL DATA:  Slurred speech, weakness cough on the right side EXAM: PORTABLE CHEST 1 VIEW COMPARISON:  07/31/2015 FINDINGS: The heart size and mediastinal contours are  within normal limits. Both lungs are clear. The visualized skeletal structures are unremarkable. IMPRESSION: No active disease. Electronically Signed   By: Kathreen Devoid   On: 10/04/2015 13:36   I have personally reviewed and evaluated these images and lab results as part of my medical decision-making.   EKG Interpretation   Date/Time:  Tuesday October 04 2015 13:20:09 EST Ventricular Rate:  36 PR Interval:  196 QRS Duration: 107 QT Interval:  622 QTC Calculation: 481 R Axis:   74 Text Interpretation:  Sinus bradycardia Borderline repolarization  abnormality Abnormal ekg Confirmed by Audie Pinto  MD, Adara Kittle ([id number]) on  10/04/2015 1:36:31 PM     Neurology was consulted  for ER evaluation. MDM   Final diagnoses:  Speech abnormality        Leonard Schwartz, MD 10/04/15 1558

## 2015-10-04 NOTE — H&P (Signed)
Date: 10/04/2015               Patient Name:  [name] MRN: [id number]  DOB: May 06, 1956 Age / Sex: 60 y.o., female   PCP: [name], NP         Medical Service: Internal Medicine Teaching Service         Attending Physician: Dr. Leonard Schwartz, MD    First Contact: Dr. Loleta Chance Pager: [phone]  Second Contact: Dr. Albin Felling Pager: [phone]       After Hours (After 5p/  First Contact Pager: [phone]  weekends / holidays): Second Contact Pager: [phone]   Chief Complaint: "I've been nauseous and confused."  History of Present Illness:  [name] is a 60 year old lady with history of cryptogenic cirrhosis, paroxysmal atrial fibrillation on apixaban, history of transient ischemic attack, uterine cancer status post hysterectomy, hypothyroidism, gastroesophageal reflux, type 2 diabetes, hypertension, hyperlipidemia, depression, presenting with slurred speech, left-sided weakness, progressive confusion, and bradycardia.  The patient tells [name] she began feeling nauseous and having chest pain 2 days ago. Since that time, she has been feeling lightheaded when she stands and progressively confused and has had difficulty finding her words. She also feels weak on her left side. She has a remote history of a transient ischemic attack, but MRI of her brain did not show an infarct, and CTA did not show a bleed.  She had a paracentesis back in November where a 3.5 L fluid was drained. She is not has a paracentesis since that time, and she has 2-3 bowel movements per day. She thinks her abdomen is been getting more distended recently. She is unsure whether she has been taking the rifaximin or not.  She also has hypothyroidism, with a TSH of 60 back in November since starting amiodarone last summer. She is unsure she has been taking the levothyroxine or not. She says her skin is been more dry and she feels much more confused, and she is notably bradycardic to the 40s with stable  pressures.  Her husband tells me she takes her medications when she feels like it, does not take them on a regular basis. He described essentially the same history as she told us in the emergency room, and emphasized her slurred speech that started today, as this is particularly concerning to him.  She denies any fevers, headache, cough, abdominal pain, dysuria, worsening lower extremity edema or orthopnea. Review of systems was otherwise nonrevealing.  Meds: No current facility-administered medications for this encounter.   Current Outpatient Prescriptions  Medication Sig Dispense Refill  . amiodarone (PACERONE) 200 MG tablet Take 1 tablet (200 mg total) by mouth daily. 30 tablet 0  . apixaban (ELIQUIS) 5 MG TABS tablet Take 1 tablet (5 mg total) by mouth 2 (two) times daily. 60 tablet 0  . atorvastatin (LIPITOR) 40 MG tablet Take 1 tablet (40 mg total) by mouth daily at 6 PM. 30 tablet 0  . citalopram (CELEXA) 20 MG tablet Take 20 mg by mouth daily.    Marland Kitchen dicyclomine (BENTYL) 10 MG capsule Take 1 capsule by mouth 4 (four) times daily - after meals and at bedtime.     . insulin detemir (LEVEMIR) 100 UNIT/ML injection Inject 0.17 mLs (17 Units total) into the skin daily. 10 mL 11  . levothyroxine (SYNTHROID, LEVOTHROID) 75 MCG tablet Take 1 tablet (75 mcg total) by mouth daily before breakfast. 30 tablet 5  . lisinopril (PRINIVIL,ZESTRIL) 10 MG tablet Take  0.5 tablets (5 mg total) by mouth 2 (two) times daily. 60 tablet 6  . metoprolol succinate (TOPROL-XL) 25 MG 24 hr tablet Take 1 tablet (25 mg total) by mouth daily. 30 tablet 0  . morphine (MS CONTIN) 60 MG 12 hr tablet Take 1 tablet (60 mg total) by mouth 2 (two) times daily. 60 tablet 0  . ondansetron (ZOFRAN) 4 MG tablet Take 1 tablet (4 mg total) by mouth daily as needed for nausea or vomiting. 20 tablet 0  . oxyCODONE-acetaminophen (ROXICET) 5-325 MG tablet Take 1 tablet by mouth every 6 (six) hours as needed. 15 tablet 0  .  pantoprazole (PROTONIX) 40 MG tablet Take 1 tablet (40 mg total) by mouth 2 (two) times daily. 60 tablet 0  . psyllium (METAMUCIL) 58.6 % packet Take 1 packet by mouth daily as needed (constipation).     . ranitidine (ZANTAC) 150 MG capsule Take 1 capsule (150 mg total) by mouth 2 (two) times daily. 28 capsule 0  . rifaximin (XIFAXAN) 550 MG TABS tablet Take 1 tablet (550 mg total) by mouth 2 (two) times daily. 60 tablet 6  . tiZANidine (ZANAFLEX) 4 MG tablet Take 4 mg by mouth every 6 (six) hours as needed for muscle spasms.     Marland Kitchen zolpidem (AMBIEN) 10 MG tablet Take 10 mg by mouth at bedtime.      Allergies: Allergies as of 10/04/2015 - Review Complete 10/04/2015  Allergen Reaction Noted  . Erythromycin base Other (See Comments)   . Rosiglitazone maleate Swelling   . Codeine sulfate Rash   . Tetanus-diphtheria toxoids td Rash and Other (See Comments)    Past Medical History  Diagnosis Date  . Type 1 diabetes ([place])   . Hypothyroid   . Degenerative disk disease   . Stomach ulcer   . Diverticulitis   . Syncope 01/2015  . Anxiety   . GERD (gastroesophageal reflux disease)   . History of hiatal hernia   . Cancer ([facility])     HX OF CANCER OF UTERUS   . TIA (transient ischemic attack) 02/2015  . Hypertension   . Pancreatitis   . PAF (paroxysmal atrial fibrillation) ([place]) 03/2015    a. new onset 03/2015 in setting of intractable N/V; b. on Eliquis 5 mg bid; c. CHADSVASc 4 (DM, TIA x 2, female)  . Intussusception intestine ([place]) 05/2015   Past Surgical History  Procedure Laterality Date  . Hernia repair    . Abdominal hysterectomy    . Cholecystectomy    . Esophagogastroduodenoscopy N/A 04/04/2015    Procedure: ESOPHAGOGASTRODUODENOSCOPY (EGD);  Surgeon: Hulen Luster, MD;  Location: [facility] END[facility]COPY;  Service: Endoscopy;  Laterality: N/A;   Family History  Problem Relation Age of Onset  . Hypertension Mother   . CAD Sister    Social History   Social History  . Marital Status: Married     Spouse Name: N/A  . Number of Children: N/A  . Years of Education: N/A   Occupational History  . Disabled 2nd back problems    Social History Main Topics  . Smoking status: Never Smoker   . Smokeless tobacco: Never Used  . Alcohol Use: No  . Drug Use: No  . Sexual Activity: No   Other Topics Concern  . Not on file   Social History Narrative   Lives in [place], [place] with her husband and 2 sons.    Review of Systems: Per history of present illness  Physical Exam: Blood pressure 199/87,  pulse 42, temperature 97.7 F (36.5 C), temperature source Oral, resp. rate 12, height 5' 3"  (1.6 m), weight 58.968 kg (130 lb), SpO2 99 %. General: Frail elderly white lady lying in bed who appears much older than her age 41: Noscleral icterus, extra-ocular muscles intact, oropharynx without lesions Cardiac: Bradycardic with regular rhythm, 2/6 systolic murmur loudest at the right upper sternal border with radiation to the carotid arteries. No JVD, and negative hepatojugular reflex. Radial pulses 2+ bilaterally. Pulm: breathing well, clear to auscultation bilaterally Abd: bowel sounds hypoactive, soft, nondistended, tender in epigastrium, dullness to percussion on flanks Ext: warm and well perfused, without pedal edema Lymph: no cervical or supraclavicular lymphadenopathy Skin: Diffuse xerosis. Teres nails. Thin hair and thinning of lateral third of eyebrow. Neuro: alert and oriented to self and date only, cranial nerves II-XII grossly intact, left upper and lower extremity strength 4/5, sensation decreased on left side of face,   Lab results: Basic Metabolic Panel:  Recent Labs  10/04/15 1359  NA 138  K 3.5  CL 104  CO2 26  GLUCOSE 352*  BUN 10  CREATININE 0.91  CALCIUM 9.1   CBC:  Recent Labs  10/04/15 1359  WBC 4.1  NEUTROABS 2.6  HGB 11.0*  HCT 32.1*  MCV 83.8  PLT 110*   Imaging results:  Ct Head Wo Contrast  10/04/2015  CLINICAL DATA:  Dizziness for 4 days,  fell 2 days ago, RIGHT parietal headache which worsened in route to hospital, slurred speech, limb ataxia, LEFT facial droop, stroke symptoms, history diabetes mellitus, TIA, hypertension, paroxysmal atrial fibrillation, uterine cancer, stroke EXAM: CT HEAD WITHOUT CONTRAST TECHNIQUE: Contiguous axial images were obtained from the base of the skull through the vertex without intravenous contrast. COMPARISON:  07/06/2015 FINDINGS: Minimal age-related atrophy. Normal ventricular morphology. No midline shift or mass effect. Otherwise normal appearance of brain parenchyma. No intracranial hemorrhage, mass lesion or evidence acute infarction. No extra-axial fluid collections. Sinuses clear and bones unremarkable. IMPRESSION: No acute intracranial abnormalities. Electronically Signed   By: Lavonia Dana M.D.   On: 10/04/2015 13:52   Mr Brain Wo Contrast  10/04/2015  CLINICAL DATA:  60 year old female with dizziness for 4 days, fall 2 days ago with head injury. Worsening symptoms. Initial encounter. EXAM: MRI HEAD WITHOUT CONTRAST TECHNIQUE: Multiplanar, multiecho pulse sequences of the brain and surrounding structures were obtained without intravenous contrast. COMPARISON:  Head CT without contrast 1339 hours today and earlier. Brain MRI 05/20/2015 and earlier. FINDINGS: Major intracranial vascular flow voids are stable and within normal limits. No restricted diffusion to suggest acute infarction. No midline shift, mass effect, evidence of mass lesion, ventriculomegaly, extra-axial collection or acute intracranial hemorrhage. Cervicomedullary junction and pituitary are within normal limits. Negative visualized cervical spine. Stable gray and white matter signal throughout the brain. Scattered mild for age nonspecific mostly subcortical white matter T2 and FLAIR hyperintensity. No cortical encephalomalacia or chronic cerebral blood products. Visible internal auditory structures appear normal. Trace mastoid fluid is  unchanged. Negative nasopharynx. Trace ethmoid sinus mucosal thickening is unchanged. Orbit and scalp soft tissues appear stable and within normal limits. Normal bone marrow signal. IMPRESSION: 1.  No acute intracranial abnormality. 2. Stable noncontrast MRI appearance of the brain since September. Mild for age nonspecific cerebral white matter signal changes most commonly due to chronic small vessel disease. 3. Stable mild mastoid effusions, most often postinflammatory. Electronically Signed   By: Genevie Ann M.D.   On: 10/04/2015 15:50   Dg Chest Portable 1 View  10/04/2015  CLINICAL DATA:  Slurred speech, weakness cough on the right side EXAM: PORTABLE CHEST 1 VIEW COMPARISON:  07/31/2015 FINDINGS: The heart size and mediastinal contours are within normal limits. Both lungs are clear. The visualized skeletal structures are unremarkable. IMPRESSION: No active disease. Electronically Signed   By: Kathreen Devoid   On: 10/04/2015 13:36    Other results: EKG: Bradycardia with first degree AV block, normal axis, no ischemic changes  Assessment & Plan by Problem:  Subacute progressive confusion: Her neurologic exam is notable for true weakness on the left upper and lower extremities, but head CT and brain MRI did not show bleed or infarct. She also has cirrhosis and has some mild ascites on exam, and may not have been taking her rifaximin at home, so hepatic encephalopathy remains on the differential, however she does not have any asterixis on exam. Spontaneous bacterial peritonitis is another consideration, but her abdomen is only slightly tender, she denies any fevers, and does not have leukocytosis. She had a TSH of 60 back in November since starting amiodarone. I also wonder if this could be a myxedema coma given her pronounced bradycardia, xerosis, and altered mental status. She also takes an unknown amount of opiates and muscle relaxers at home. She is not miotic on exam, but this could be medication related.  Multiple sclerosis is another consideration given her focal neurologic exam and altered mental status, but brain MRI did not show any Dawson's fingers. It may be worthwhile getting an MRI of her C-spine to look for further lesions of the above workup is nonrevealing. Occult infection of course remains on the differential as well, so we will check a urinalysis. -Check ammonia level -Ordered abdominal ultrasound to evaluate for ascites -Check TSH and free T4 -Hold narcotics and muscle relaxers -Check urinalysis -Can consider C-spine MRI to further evaluate for MS -We may want to consider paracentesis if she does not improve  Bradycardia with first-degree AV block: It's unclear whether she has been taking her metoprolol and amiodarone as prescribed. We will hold them for now. Her pressures look good so no need to pace or give atropine. -Holding Toprol-XL 25 mg daily -Holding amiodarone 200 mg daily -Trending troponins -Morning EKG -Telemetry  Hypothyroidism: Last TSH 61 in November 2016. I think her hypothyroidism may be related to her presentation, and may be due to amiodarone. -Checking TSH and free T4 -Continue levothyroxine 39mg daily  Cryptogenic cirrhosis: Per chart review, it appears she had an extensive workup for cirrhosis in the past that was nonrevealing. Hepatitis B and C were negative as well as AFP markers. Her paracentesis cytology did not show malignant cells back in November 2016. She has stable mild thrombocytopenia of 110, and her prothrombin time remained stable at 1.4 -Ordered CMP  -Checking abdominal ultrasound -Continue rifaximin 550 mg twice daily  Paroxysmal atrial fibrillation: Per above, she is in sinus bradycardia. -Continue apixaban 5 mg twice daily  Hypertension: Her pressures are surprisingly elevated to the 170s over 80s despite being bradycardic in the 30s. I considered this could be a Cushing's reflex, as she reported she fell and is on anticoagulation, but  her CT head did not show an acute bleed. -Continue home lisinopril 5 mg daily  Type 2 diabetes: Last a1c 9.2 in October 2016. -Checking a1c -Dropped detemir home dose from 17U nightly to 10 U nightly -Sensitive sliding scale  Hyperlipidemia: Last LDL 51 in September 2016. -Continue atorvastatin 431mdaily  Dispo: Disposition is deferred at  this time, awaiting improvement of current medical problems.   The patient does have a current PCP [name], NP) and does need an [facility] hospital follow-up appointment after discharge.  The patient does have transportation limitations that hinder transportation to clinic appointments.  Signed: Loleta Chance, MD 10/04/2015, 5:26 PM

## 2015-10-04 NOTE — ED Notes (Signed)
Phlebotomy at bedside.

## 2015-10-04 NOTE — Discharge Summary (Signed)
Name: [name] MRN: [id number] DOB: 1956/04/22 60 y.o. PCP: [name], NP  Date of Admission: 10/04/2015  1:17 PM Date of Discharge: 10/08/2015 Attending Physician: Annia Belt, MD  Discharge Diagnosis: 1. Transient left-sided weakness with normal brain MRI 2. Bradycardia thought to be from medication non-compliance versus tachy-brady syndrome 3. Hypothyrdoisim 4. Secondary versus tertiary adrenal insufficiency 5. Paroxysmal atrial fibrillation  Discharge Medications:   Medication List    STOP taking these medications        amiodarone 200 MG tablet  Commonly known as:  PACERONE     metoprolol succinate 25 MG 24 hr tablet  Commonly known as:  TOPROL-XL     ranitidine 150 MG capsule  Commonly known as:  ZANTAC      TAKE these medications        amLODipine 5 MG tablet  Commonly known as:  NORVASC  Take 1 tablet (5 mg total) by mouth daily.     apixaban 5 MG Tabs tablet  Commonly known as:  ELIQUIS  Take 1 tablet (5 mg total) by mouth 2 (two) times daily.     aspirin 81 MG EC tablet  Take 1 tablet (81 mg total) by mouth daily.     atorvastatin 40 MG tablet  Commonly known as:  LIPITOR  Take 1 tablet (40 mg total) by mouth daily at 6 PM.     dicyclomine 10 MG capsule  Commonly known as:  BENTYL  Take 1 capsule by mouth 4 (four) times daily - after meals and at bedtime.     feeding supplement (GLUCERNA SHAKE) Liqd  Take 237 mLs by mouth 3 (three) times daily after meals.     insulin detemir 100 UNIT/ML injection  Commonly known as:  LEVEMIR  Inject 0.17 mLs (17 Units total) into the skin daily.     lactulose 10 GM/15ML solution  Commonly known as:  CHRONULAC  Take 15-45 mLs (10-30 g total) by mouth 2 (two) times daily as needed (P).     levothyroxine 75 MCG tablet  Commonly known as:  SYNTHROID, LEVOTHROID  Take 1 tablet (75 mcg total) by mouth daily before breakfast.     lisinopril 10 MG tablet  Commonly known as:   PRINIVIL,ZESTRIL  Take 0.5 tablets (5 mg total) by mouth 2 (two) times daily.     metoprolol tartrate 25 MG tablet  Commonly known as:  LOPRESSOR  Take 1 tablet (25 mg total) by mouth 2 (two) times daily.     morphine 60 MG 12 hr tablet  Commonly known as:  MS CONTIN  Take 1 tablet (60 mg total) by mouth 2 (two) times daily.     ondansetron 4 MG tablet  Commonly known as:  ZOFRAN  Take 1 tablet (4 mg total) by mouth daily as needed for nausea or vomiting.     oxyCODONE-acetaminophen 5-325 MG tablet  Commonly known as:  ROXICET  Take 1 tablet by mouth every 6 (six) hours as needed.     pantoprazole 40 MG tablet  Commonly known as:  PROTONIX  Take 1 tablet (40 mg total) by mouth 2 (two) times daily.     rifaximin 550 MG Tabs tablet  Commonly known as:  XIFAXAN  Take 1 tablet (550 mg total) by mouth 2 (two) times daily.     tiZANidine 4 MG tablet  Commonly known as:  ZANAFLEX  Take 4 mg by mouth 3 (three) times daily as needed for muscle spasms.  zolpidem 10 MG tablet  Commonly known as:  AMBIEN  Take 10 mg by mouth at bedtime. Reported on 10/04/2015        Disposition and follow-up:   Ms.Siham A Conerly was discharged from [facility] in Good condition.  At the hospital follow up visit please address:  1. Her compliance with her medications is of utmost importance  2. Re-check TSH near the end of February or beginning of March and consider stopping levothyroxine if her TSH has normalized since she is no longer taking amiodarone  3. If she complaints of persistent weakness and fatigue, consider checking morning ACTH level to evaluate for secondary versus tertiary adrenal insufficiency given her low cortisol levels observed while inpatient  Follow-up Appointments: Call your PCP Dr. Kary Kos for a follow-up appointment between Feb 3-10 See your new cardiologist on February 6th  Consultations:  Cardiology - Dr. Ena Dawley Electrophysiology - Dr.  Lovena Le  Procedures Performed:  X-ray Chest Pa And Lateral  10/05/2015  CLINICAL DATA:  Chest pain. EXAM: CHEST  2 VIEW COMPARISON:  10/04/2015 FINDINGS: Shallow inspiration. The heart size and mediastinal contours are within normal limits. Both lungs are clear. The visualized skeletal structures are unremarkable. Surgical clips in the right upper quadrant. IMPRESSION: No active cardiopulmonary disease. Electronically Signed   By: Lucienne Capers M.D.   On: 10/05/2015 00:57   Ct Head Wo Contrast  10/04/2015  CLINICAL DATA:  Dizziness for 4 days, fell 2 days ago, RIGHT parietal headache which worsened in route to hospital, slurred speech, limb ataxia, LEFT facial droop, stroke symptoms, history diabetes mellitus, TIA, hypertension, paroxysmal atrial fibrillation, uterine cancer, stroke EXAM: CT HEAD WITHOUT CONTRAST TECHNIQUE: Contiguous axial images were obtained from the base of the skull through the vertex without intravenous contrast. COMPARISON:  07/06/2015 FINDINGS: Minimal age-related atrophy. Normal ventricular morphology. No midline shift or mass effect. Otherwise normal appearance of brain parenchyma. No intracranial hemorrhage, mass lesion or evidence acute infarction. No extra-axial fluid collections. Sinuses clear and bones unremarkable. IMPRESSION: No acute intracranial abnormalities. Electronically Signed   By: Lavonia Dana M.D.   On: 10/04/2015 13:52   Mr Brain Wo Contrast  10/04/2015  CLINICAL DATA:  60 year old female with dizziness for 4 days, fall 2 days ago with head injury. Worsening symptoms. Initial encounter. EXAM: MRI HEAD WITHOUT CONTRAST TECHNIQUE: Multiplanar, multiecho pulse sequences of the brain and surrounding structures were obtained without intravenous contrast. COMPARISON:  Head CT without contrast 1339 hours today and earlier. Brain MRI 05/20/2015 and earlier. FINDINGS: Major intracranial vascular flow voids are stable and within normal limits. No restricted diffusion to  suggest acute infarction. No midline shift, mass effect, evidence of mass lesion, ventriculomegaly, extra-axial collection or acute intracranial hemorrhage. Cervicomedullary junction and pituitary are within normal limits. Negative visualized cervical spine. Stable gray and white matter signal throughout the brain. Scattered mild for age nonspecific mostly subcortical white matter T2 and FLAIR hyperintensity. No cortical encephalomalacia or chronic cerebral blood products. Visible internal auditory structures appear normal. Trace mastoid fluid is unchanged. Negative nasopharynx. Trace ethmoid sinus mucosal thickening is unchanged. Orbit and scalp soft tissues appear stable and within normal limits. Normal bone marrow signal. IMPRESSION: 1.  No acute intracranial abnormality. 2. Stable noncontrast MRI appearance of the brain since September. Mild for age nonspecific cerebral white matter signal changes most commonly due to chronic small vessel disease. 3. Stable mild mastoid effusions, most often postinflammatory. Electronically Signed   By: Genevie Ann M.D.   On:  10/04/2015 15:50   Mr Cervical Spine Wo Contrast  10/04/2015  CLINICAL DATA:  60 year old female with onset of dizziness 4 days ago and fall 2 days ago hitting head. Altered speech. Left-sided weakness. Subsequent encounter. EXAM: MRI CERVICAL SPINE WITHOUT CONTRAST TECHNIQUE: Multiplanar, multisequence MR imaging of the cervical spine was performed. No intravenous contrast was administered. COMPARISON:  10/04/2015 brain MR. 05/27/2015 cervical spine CT. FINDINGS: Exam is motion degraded. Cervical medullary junction and visualized intracranial structures unremarkable. No obvious focal cervical cord signal abnormality. Mild edema within the right C3 facet (series 4, images 1 and 2) raises possibility of bone bruise or subtle fracture versus degenerative changes. No other findings of osseous or soft tissue edema to suggest acute injury. C2-3:  Negative. C3-4:  Negative. C4-5:  Minimal bulge. C5-6: Minimal to mild bulge. Slight narrowing ventral thecal sac greater on the right. Mild right foraminal narrowing. C6-7:  Minimal bulge. C7-T1:  Negative. IMPRESSION: Exam is motion degraded. Mild edema within the right C3 facet raises possibility of bone bruise or subtle fracture (given history) versus degenerative changes. No other findings of osseous or soft tissue edema to suggest acute injury. Minimal to mild degenerative changes most notable C5-6 level as detailed above. Electronically Signed   By: Genia Del M.D.   On: 10/04/2015 19:18   US Abdomen Complete  10/04/2015  CLINICAL DATA:  Abdominal pain.  Nausea.  Symptoms for 3 days. EXAM: ABDOMEN ULTRASOUND COMPLETE COMPARISON:  CT abdomen and pelvis 08/14/2015 FINDINGS: Gallbladder: Gallbladder is surgically absent. Common bile duct: Diameter: 4 mm, normal Liver: Diffuse coarsening of hepatic parenchymal echotexture with nodular contour consistent with cirrhosis. No focal lesions identified. Small amount of free fluid around the liver is likely ascites. No Doppler images were obtained of the portal veins. IVC: No abnormality visualized. Pancreas: Not visualized due to overlying bowel gas. Spleen: Size and appearance within normal limits. Right Kidney: Length: 10 cm. Stone in the lower pole measuring 1 cm diameter. No hydronephrosis. Normal parenchymal echotexture. Left Kidney: Length: 11.2 cm. Echogenicity within normal limits. No mass or hydronephrosis visualized. Abdominal aorta: No aneurysm visualized. Other findings: None. IMPRESSION: Surgical absence of the gallbladder. No bile duct dilatation. Changes of hepatic cirrhosis with free fluid consistent with ascites. Nonobstructing right renal stone. Electronically Signed   By: Lucienne Capers M.D.   On: 10/04/2015 20:06   Dg Chest Portable 1 View  10/04/2015  CLINICAL DATA:  Slurred speech, weakness cough on the right side EXAM: PORTABLE CHEST 1 VIEW COMPARISON:   07/31/2015 FINDINGS: The heart size and mediastinal contours are within normal limits. Both lungs are clear. The visualized skeletal structures are unremarkable. IMPRESSION: No active disease. Electronically Signed   By: Kathreen Devoid   On: 10/04/2015 13:36    Admission HPI: Ms. Strauss is a 60 year old lady with history of cryptogenic cirrhosis, paroxysmal atrial fibrillation on apixaban, history of transient ischemic attack, uterine cancer status post hysterectomy, hypothyroidism, gastroesophageal reflux, type 2 diabetes, hypertension, hyperlipidemia, depression, presenting with slurred speech, left-sided weakness, progressive confusion, and bradycardia.  The patient tells [name] she began feeling nauseous and having chest pain 2 days ago. Since that time, she has been feeling lightheaded when she stands and progressively confused and has had difficulty finding her words. She also feels weak on her left side. She has a remote history of a transient ischemic attack, but MRI of her brain did not show an infarct, and CTA did not show a bleed.  She had a paracentesis  back in November where a 3.5 L fluid was drained. She is not has a paracentesis since that time, and she has 2-3 bowel movements per day. She thinks her abdomen is been getting more distended recently. She is unsure whether she has been taking the rifaximin or not.  She also has hypothyroidism, with a TSH of 60 back in November since starting amiodarone last summer. She is unsure she has been taking the levothyroxine or not. She says her skin is been more dry and she feels much more confused, and she is notably bradycardic to the 40s with stable pressures.  Her husband tells me she takes her medications when she feels like it, does not take them on a regular basis. He described essentially the same history as she told us in the emergency room, and emphasized her slurred speech that started today, as this is particularly concerning to him.  She  denies any fevers, headache, cough, abdominal pain, dysuria, worsening lower extremity edema or orthopnea. Review of systems was otherwise nonrevealing.  Hospital Course by problem list:   1. Transient left-sided weakness with normal brain MRI: She presented with left-sided hemiplegia and expressive aphasia; head CT and brain MRI did not show a bleed or infarct. Neurology evaluated her and thought this was psychogenic. Although she did not have MRI findings, we cannot confidently rule out a transient ischemic attack, so we started on aspirin 81 mg daily. Her symptoms completely resolved by the third day of hospitalization.  2. Bradycardia thought to be from medication non-compliance versus tachy-brady syndrome: She presented with bradycardia with rates in the 30s, with pressures in the 90s over 40s. Her husband explained she takes her medications as she feels she needs them. Prior to admission, she was on Toprol-XL 25 mg daily and amiodarone 200 mg daily. We held these, and her bradycardia resolved, and she actually became tachycardic. Cardiology evaluated her and recommended starting back metoprolol 25 mg twice daily. Her rates subsequently normalized, and she was actually slightly hypertensive, so they added amlodipine 5 mg daily as well. Her amiodarone was held indefinitely. Notably, this is her ninth admission in the past year for bradycardia. I suspect most of this is due to medication noncompliance, so I reiterated the importance of taking these medications as prescribed. Electrophysiology also saw her to evaluate her for pacemaker, but did not feel she was a candidate. She has a cardiology follow-up appointment on February 6.  3. Hypothyroidism: On chart review, it appears she became hypothyroid shortly after starting amiodarone. Her TSH this admission was 22. There was question whether she was actually taking her levothyroxine, so we continued her on the 75 g dose. Amiodarone was discontinued upon  discharge, so I expect her TSH normalized. She should have a repeat TSH near the end of February or early March; if normal, she will not need more levothyroxin.  4. Secondary versus tertiary adrenal insufficiency: She presented quite confused, lethargic, and hypotensive, so we checked a morning cortisol that was low at 2.1. A subsequent ACTH stimulation test showed an appropriate cortisol response, ruling out primary adrenal insufficiency. I think secondary adrenal insufficiency is less likely given her elevated TSH, implicating normal pituitary function. Tertiary adrenal insufficiency was our final consideration, and has been seen in patients taking chronic opiates which she certainly does. If she continues having these episodes of lethargy and confusion, a morning ACTH level could be drawn to confirm tertiary adrenal insufficiency, and she could be potentially started on a glucocorticoid.  5. Paroxysmal atrial fibrillation: She was in normal sinus rhythm during the length of her stay. She was continued on apixaban and discharged on this medication.  Discharge Vitals:   BP 133/74 mmHg  Pulse 85  Temp(Src) 98.5 F (36.9 C) (Oral)  Resp 16  Ht 5' 3"  (1.6 m)  Wt 61.961 kg (136 lb 9.6 oz)  BMI 24.20 kg/m2  SpO2 100%  Discharge Labs:  Results for orders placed or performed during the hospital encounter of 10/04/15 (from the past 24 hour(s))  Glucose, capillary     Status: Abnormal   Collection Time: 10/07/15  4:50 PM  Result Value Ref Range   Glucose-Capillary 290 (H) 65 - 99 mg/dL  Glucose, capillary     Status: Abnormal   Collection Time: 10/07/15  9:21 PM  Result Value Ref Range   Glucose-Capillary 125 (H) 65 - 99 mg/dL  Basic metabolic panel     Status: Abnormal   Collection Time: 10/08/15  3:21 AM  Result Value Ref Range   Sodium 139 135 - 145 mmol/L   Potassium 4.1 3.5 - 5.1 mmol/L   Chloride 103 101 - 111 mmol/L   CO2 26 22 - 32 mmol/L   Glucose, Bld 176 (H) 65 - 99 mg/dL   BUN  17 6 - 20 mg/dL   Creatinine, Ser 0.75 0.44 - 1.00 mg/dL   Calcium 9.3 8.9 - 10.3 mg/dL   GFR calc non Af Amer >60 >60 mL/min   GFR calc Af Amer >60 >60 mL/min   Anion gap 10 5 - 15  Glucose, capillary     Status: Abnormal   Collection Time: 10/08/15  6:12 AM  Result Value Ref Range   Glucose-Capillary 254 (H) 65 - 99 mg/dL  Glucose, capillary     Status: Abnormal   Collection Time: 10/08/15 11:17 AM  Result Value Ref Range   Glucose-Capillary 148 (H) 65 - 99 mg/dL    Signed: Loleta Chance, MD 10/08/2015, 1:04 PM    Services Ordered on Discharge: Home health physical therapy

## 2015-10-04 NOTE — H&P (Signed)
Date: 10/04/2015               Patient Name:  [name] MRN: [id number]  DOB: 15-Jul-1956 Age / Sex: 60 y.o., female   PCP: [name], NP              Medical Service: Internal Medicine Teaching Service    Attending Physician: Dr. Annia Belt, MD          Chief Complaint: weakness, speech deficit  History of Present Illness: [name] is a 60 year-old woman with extensive medical history including history of paroxysmal atrial fibrillation on Eliquis, prior TIA, cirrhosis, type 1 diabetes mellitus, uterine cancer s/p hysterectomy, hypertension and hypothyroidism who presents with left-sided weakness, confusion, word-finding difficulties and bradycardia .   [name] reports confusion with regards to where she is and what exactly brought her to the hospital. She says that for the past four days, she has had decreased appetite and fluid intake and considerable dizziness. Two days ago, she reports having fallen in her bedroom after rising from the bed and hitting her head on the bedside table. She had sharp head pain after this, but no other symptoms. Yesterday, she reports developing blurred vision and left-sided weakness. She also describes some chest pain this morning. Today, she developed expressive aphasia, which prompted her husband to call 911. She believes she has been taking all her medications as prescribed, though does not know names or doses.   Her husband is not immediately available, but is able corroborate her story over the phone with the exception of the fall she described. He does not believe she has been taking her medications regularly, but rather when she feels like taking them. He also emphasized her slurred speech.   [name] denies any fever, chills, abdominal pain, dysuria, orthopnea or lower extremity edema.   Review of Systems: Negative except as noted in HPI.   Meds:  Medication Sig  . amiodarone (PACERONE) 200 MG tablet Take 1 tablet  (200 mg total) by mouth daily.  Marland Kitchen apixaban (ELIQUIS) 5 MG TABS tablet Take 1 tablet (5 mg total) by mouth 2 (two) times daily.  Marland Kitchen atorvastatin (LIPITOR) 40 MG tablet Take 1 tablet (40 mg total) by mouth daily at 6 PM.  . citalopram (CELEXA) 20 MG tablet Take 20 mg by mouth daily.  Marland Kitchen dicyclomine (BENTYL) 10 MG capsule Take 1 capsule by mouth 4 (four) times daily - after meals and at bedtime.   . insulin detemir (LEVEMIR) 100 UNIT/ML injection Inject 0.17 mLs (17 Units total) into the skin daily.  Marland Kitchen levothyroxine (SYNTHROID, LEVOTHROID) 75 MCG tablet Take 1 tablet (75 mcg total) by mouth daily before breakfast.  . lisinopril (PRINIVIL,ZESTRIL) 10 MG tablet Take 0.5 tablets (5 mg total) by mouth 2 (two) times daily.  . metoprolol succinate (TOPROL-XL) 25 MG 24 hr tablet Take 1 tablet (25 mg total) by mouth daily.  Marland Kitchen morphine (MS CONTIN) 60 MG 12 hr tablet Take 1 tablet (60 mg total) by mouth 2 (two) times daily.  . ondansetron (ZOFRAN) 4 MG tablet Take 1 tablet (4 mg total) by mouth daily as needed for nausea or vomiting.  Marland Kitchen oxyCODONE-acetaminophen (ROXICET) 5-325 MG tablet Take 1 tablet by mouth every 6 (six) hours as needed.  . pantoprazole (PROTONIX) 40 MG tablet Take 1 tablet (40 mg total) by mouth 2 (two) times daily.  . psyllium (METAMUCIL) 58.6 % packet Take 1 packet by mouth daily as needed (constipation).   Marland Kitchen  ranitidine (ZANTAC) 150 MG capsule Take 1 capsule (150 mg total) by mouth 2 (two) times daily.  . rifaximin (XIFAXAN) 550 MG TABS tablet Take 1 tablet (550 mg total) by mouth 2 (two) times daily.  Marland Kitchen tiZANidine (ZANAFLEX) 4 MG tablet Take 4 mg by mouth every 6 (six) hours as needed for muscle spasms.   Marland Kitchen zolpidem (AMBIEN) 10 MG tablet Take 10 mg by mouth at bedtime.    Allergies: Erythromycin base  Rosiglitazone maleate (swelling) Codeine sulfate (rash)  Tetanus-diphtheria toxoids (rash)   Past Medical History: Type 1 diabetes mellitus Hypothyroidism Degenerative disc  disease Stomach ulcer  Diverticulitis Syncope Anxiety  Gastroesophageal reflux disease Hiatal hernia  Uterine cancer Transient ischemic attack Hypertension Pancreatitis Paroxysmal atrial fibrillation  Intussusception   Past Surgical History: Hernia repair Abdominal hysterectomy  Cholecystectomy  Esophagogastroduodenoscopy   Family History: Mother: hypertension Sister: CAD  Social History: [name] reports that she is married and lives with her husband and two sons in [place], [place]. She reports no tobacco, alcohol or drug use.   Physical Exam: Blood pressure 176/78, pulse 36, temperature 97.7 F (36.5 C), temperature source Oral, resp. rate 17, height 5' 3"  (1.6 m), weight 58.968 kg (130 lb), SpO2 99 %. General: Frail woman lying in bed, appears older than age  77: PERRLA, EOMI, no scleral icterus, oropharynx clear Cardio: Bradycardic, but regular rhythm. II/VI systolic murmur heard loudest at the right upper sternal border and radiating to the carotids. Radial pulses 2+.  Pulm/Chest: Clear to auscultation without wheezes, crackles or rales. Normal work of breathing.  Abd: Soft, nondistended. Tender in epigastrium. Dull to percussion in RUQ. Hypoactive bowel sounds.   MSK: No lower extremity edema.  Skin: Warm, dry. Hair appears thin and dry.  Neuro: Alert and oriented to self only, visibly confused. Decreased sensation on left half of face, unable to puff cheeks, raise eyebrows or smile fully. Strength 4/5 in left upper and lower extremity, 5/5 in right upper and lower extremity.   Lab results: CBC notable for RBC 3.83, Hgb 11.0, Hct 32.1, Plt 110. BMP notable only for Glu 352.  I-Stat Troponin 0.01. PT 16.8 / INR 1.35  Imaging results:  Ct Head Wo Contrast  10/04/2015  IMPRESSION: No acute intracranial abnormalities.   Mr Brain Wo Contrast  10/04/2015  IMPRESSION: 1.  No acute intracranial abnormality. 2. Stable noncontrast MRI appearance of the brain since  September. Mild for age nonspecific cerebral white matter signal changes most commonly due to chronic small vessel disease. 3. Stable mild mastoid effusions, most often postinflammatory.   Dg Chest Portable 1 View  10/04/2015   IMPRESSION: No active disease.  Other results: EKG: sinus bradycardia, no ischemic changes, normal axis. Borderline first degree AV block (PR interval 196 ms).   Assessment & Plan: Ms. Sivertson is a 60 year-old woman with medical history including paroxysmal atrial fibrillation on Eliquis, prior TIA, cirrhosis, type 1 diabetes mellitus, uterine cancer s/p hysterectomy, hypertension and hypothyroidism who presents with left-sided weakness, confusion, word-finding difficulties and bradycardia .   1. Confusion and word-finding difficulty: Her accompanying left-sided weakness is concerning for possible TIA or CVA, especially given her history of two TIAs in the past. The slow progression of her symptoms is also concerning for subdural hematoma after her fall two days ago. Both CT and MRI, however, were negative for bleed or infarct. The next major consideration is hepatic encephalopathy given her history of cirrhosis and recent ascites requiring paracentesis in November and her questionable adherence  with her rifaximin and lactulose. She has only mild ascites on exam, however, and does not have asterixis. Additionally, this would not explain her left-sided weakness. Lack of fever, leukocytosis or significant abdominal pain makes spontaneous bacterial peritonitis unlikely. Her alarmingly high TSH of 61 in November raises concern for severe hypothyroidism or myxedema coma, especially because she is taking amiodarone, which may be worsening her thyroid function. While her bradycardia and confusion are consistent with myxedema coma, she does not have the hypoglycemia, hyponatremia, hypothermia, or evidence of myxedema (enlarged tongue, swollen features, etc) that we would also expect. With her  chronic pain and erratic medication schedule, another consideration is oversedation from opiates or muscle relaxants. Lack of meiosis and normal respiratory rate argue against this. Occult infection is a possibility, especially UTI, which is a common cause of delirium and confusion in older patients. Unfortunately, the majority of this differential does not account for the accompanying weakness and sensory deficits present on her neurologic exam. Without acute changes on her CT or MRI, it is difficult to say at this point what else could be causing unilateral weakness in both the upper and lower extremities.  - Check ammonia level - Consider paracentesis if she worsens or does not improve - Thyroid labs, as below - Hold all home muscle relaxants and narcotics - Check urinalysis   2. Bradycardia: Heart rate between 35-45 in the emergency department with borderline first degree AV block on EKG (PR interval 196 ms). From her husband's report, it sounds like she has been erratic with her medication schedule and it is possible she has recently taken more than usual of her beta blocker or antiarrhythmic. We will hold these medications now. Though her heart rate is slow, her high blood pressures suggest no need for atropine. We will trend troponins overnight to help exclude ischemic cause.  - Monitor on telemetry overnight; AM EKG  - Trend troponins q 6 hours x 3 - Hold amiodarone 200 mg daily - Hold Toprol XL 25 mg daily   3. Hypothyroidism: Most recent labs from Nov 2016 showed TSH of 61, FT4 0.58. Again, it does not seem that she takes her medications regularly, including her levothyroxine. Additionally, she is on amiodarone, which has been reported to cause hypothyroidism, or in her case, exacerbate it. As above, it is possible that severe hypothyroidism or myxedema coma is causing her worsening confusion, bradycardia, and dry skin.  - Continue levothyroxine 75 mcg daily  - Check TSH and FT4  4.  Cirrhosis: Unclear etiology, as in the past she has had negative Hep B and C serologies, negative AFP, and paracentesis that didn't show malignant cells.  - Abdominal ultrasound  - Continue home rifaximin 550 mg twice daily - Check CMP   5. Paroxysmal atrial fibrillation: In sinus bradycardia here. CHADSVASc score of 4 (DM, TIA x 2, female). On Eliquis 5 mg twice daily at home.  - Continue home Eliquis   6. Hypertension: Pressures significantly elevated to 128'N systolic in the emergency department. We will continue her home ACE inhibitor and consider adding or increasing antihypertensives if her elevated pressures persist.  - Continue home lisinopril 5 mg daily   7. Type 1 diabetes mellitus: Blood glucose of 352 mg/dL on admission, most recent A1c of 9.2% in October 2016. She is on Levemir 17 units nightly at home.  - Sliding scale insulin, sensitive regimen - Decrease nighttime Levemir to 10 units  - Check A1c   This is a Careers information officer Note.  The care of the patient was discussed with Dr. Melburn Hake and the assessment and plan was formulated with their assistance.  Please see their note for official documentation of the patient encounter.   Everlene Balls, [facility]  10/04/2015, 5:47 PM

## 2015-10-04 NOTE — Progress Notes (Signed)
Patient arrived from ED via nurse. Oriented to room and equipment. Will continue to monitor. [name], [place] 10/04/2015 2025

## 2015-10-05 ENCOUNTER — Observation Stay (HOSPITAL_COMMUNITY): Payer: Commercial Managed Care - HMO

## 2015-10-05 ENCOUNTER — Encounter (HOSPITAL_COMMUNITY): Payer: Self-pay | Admitting: Physician Assistant

## 2015-10-05 ENCOUNTER — Observation Stay (HOSPITAL_BASED_OUTPATIENT_CLINIC_OR_DEPARTMENT_OTHER): Payer: Commercial Managed Care - HMO

## 2015-10-05 DIAGNOSIS — R001 Bradycardia, unspecified: Secondary | ICD-10-CM | POA: Diagnosis not present

## 2015-10-05 DIAGNOSIS — I440 Atrioventricular block, first degree: Secondary | ICD-10-CM

## 2015-10-05 DIAGNOSIS — R6889 Other general symptoms and signs: Secondary | ICD-10-CM

## 2015-10-05 DIAGNOSIS — E039 Hypothyroidism, unspecified: Secondary | ICD-10-CM

## 2015-10-05 DIAGNOSIS — I10 Essential (primary) hypertension: Secondary | ICD-10-CM | POA: Diagnosis not present

## 2015-10-05 DIAGNOSIS — Z9114 Patient's other noncompliance with medication regimen: Secondary | ICD-10-CM | POA: Diagnosis not present

## 2015-10-05 DIAGNOSIS — K7030 Alcoholic cirrhosis of liver without ascites: Secondary | ICD-10-CM | POA: Diagnosis not present

## 2015-10-05 DIAGNOSIS — G459 Transient cerebral ischemic attack, unspecified: Secondary | ICD-10-CM

## 2015-10-05 DIAGNOSIS — E119 Type 2 diabetes mellitus without complications: Secondary | ICD-10-CM

## 2015-10-05 DIAGNOSIS — M6281 Muscle weakness (generalized): Secondary | ICD-10-CM

## 2015-10-05 DIAGNOSIS — I480 Paroxysmal atrial fibrillation: Secondary | ICD-10-CM

## 2015-10-05 DIAGNOSIS — R072 Precordial pain: Secondary | ICD-10-CM

## 2015-10-05 DIAGNOSIS — D696 Thrombocytopenia, unspecified: Secondary | ICD-10-CM

## 2015-10-05 DIAGNOSIS — M6289 Other specified disorders of muscle: Secondary | ICD-10-CM

## 2015-10-05 DIAGNOSIS — E032 Hypothyroidism due to medicaments and other exogenous substances: Secondary | ICD-10-CM | POA: Diagnosis not present

## 2015-10-05 DIAGNOSIS — K7469 Other cirrhosis of liver: Secondary | ICD-10-CM

## 2015-10-05 DIAGNOSIS — I4581 Long QT syndrome: Secondary | ICD-10-CM

## 2015-10-05 DIAGNOSIS — R079 Chest pain, unspecified: Secondary | ICD-10-CM | POA: Diagnosis not present

## 2015-10-05 DIAGNOSIS — Z794 Long term (current) use of insulin: Secondary | ICD-10-CM

## 2015-10-05 DIAGNOSIS — R4781 Slurred speech: Secondary | ICD-10-CM | POA: Diagnosis not present

## 2015-10-05 DIAGNOSIS — R4789 Other speech disturbances: Secondary | ICD-10-CM

## 2015-10-05 DIAGNOSIS — R410 Disorientation, unspecified: Secondary | ICD-10-CM

## 2015-10-05 DIAGNOSIS — Z7901 Long term (current) use of anticoagulants: Secondary | ICD-10-CM

## 2015-10-05 DIAGNOSIS — R109 Unspecified abdominal pain: Secondary | ICD-10-CM | POA: Diagnosis not present

## 2015-10-05 DIAGNOSIS — E785 Hyperlipidemia, unspecified: Secondary | ICD-10-CM

## 2015-10-05 DIAGNOSIS — H538 Other visual disturbances: Secondary | ICD-10-CM | POA: Diagnosis not present

## 2015-10-05 DIAGNOSIS — R531 Weakness: Secondary | ICD-10-CM | POA: Diagnosis not present

## 2015-10-05 DIAGNOSIS — R479 Unspecified speech disturbances: Secondary | ICD-10-CM | POA: Insufficient documentation

## 2015-10-05 LAB — BASIC METABOLIC PANEL
ANION GAP: 12 (ref 5–15)
ANION GAP: 12 (ref 5–15)
BUN: 8 mg/dL (ref 6–20)
BUN: 7 mg/dL (ref 6–20)
CHLORIDE: 103 mmol/L (ref 101–111)
CHLORIDE: 105 mmol/L (ref 101–111)
CO2: 23 mmol/L (ref 22–32)
CO2: 21 mmol/L — AB (ref 22–32)
CREATININE: 0.96 mg/dL (ref 0.44–1.00)
Calcium: 8.2 mg/dL — ABNORMAL LOW (ref 8.9–10.3)
Calcium: 8.7 mg/dL — ABNORMAL LOW (ref 8.9–10.3)
Creatinine, Ser: 0.95 mg/dL (ref 0.44–1.00)
GFR calc non Af Amer: >60 mL/min (ref >60)
GFR calc non Af Amer: >60 mL/min (ref >60)
Glucose, Bld: 158 mg/dL — ABNORMAL HIGH (ref 65–99)
Glucose, Bld: 144 mg/dL — ABNORMAL HIGH (ref 65–99)
POTASSIUM: 2.6 mmol/L — AB (ref 3.5–5.1)
Potassium: 3.7 mmol/L (ref 3.5–5.1)
SODIUM: 138 mmol/L (ref 135–145)
SODIUM: 138 mmol/L (ref 135–145)

## 2015-10-05 LAB — URINALYSIS, ROUTINE W REFLEX MICROSCOPIC
BILIRUBIN URINE: NEGATIVE
GLUCOSE, UA: 500 mg/dL — AB
KETONES UR: NEGATIVE mg/dL
LEUKOCYTES UA: NEGATIVE
Nitrite: NEGATIVE
PH: 7 (ref 5.0–8.0)
PROTEIN: NEGATIVE mg/dL
Specific Gravity, Urine: 1.016 (ref 1.005–1.030)

## 2015-10-05 LAB — URINE MICROSCOPIC-ADD ON

## 2015-10-05 LAB — CORTISOL: Cortisol, Plasma: 2.1 ug/dL

## 2015-10-05 LAB — GLUCOSE, CAPILLARY
GLUCOSE-CAPILLARY: 250 mg/dL — AB (ref 65–99)
GLUCOSE-CAPILLARY: 180 mg/dL — AB (ref 65–99)
GLUCOSE-CAPILLARY: 132 mg/dL — AB (ref 65–99)
Glucose-Capillary: 163 mg/dL — ABNORMAL HIGH (ref 65–99)

## 2015-10-05 LAB — LIPID PANEL
CHOLESTEROL: 157 mg/dL (ref 0–200)
HDL: 43 mg/dL (ref >40)
LDL Cholesterol: 94 mg/dL (ref 0–99)
Total CHOL/HDL Ratio: 3.7 RATIO
Triglycerides: 101 mg/dL (ref <150)
VLDL: 20 mg/dL (ref 0–40)

## 2015-10-05 LAB — TROPONIN I
Troponin I: <0.03 ng/mL (ref <0.031)
Troponin I: <0.03 ng/mL (ref <0.031)

## 2015-10-05 MED ORDER — GLUCERNA SHAKE PO LIQD
237.0000 mL | Freq: Three times a day (TID) | ORAL | Status: DC
Start: 2015-10-05 — End: 2015-10-08
  Administered 2015-10-05 – 2015-10-08 (×6): 237 mL via ORAL
  Filled 2015-10-05 (×4): qty 237

## 2015-10-05 MED ORDER — POTASSIUM CHLORIDE CRYS ER 20 MEQ PO TBCR: 40.0000 meq | EXTENDED_RELEASE_TABLET | Freq: Two times a day (BID) | ORAL | Status: DC

## 2015-10-05 MED ORDER — POTASSIUM CHLORIDE 10 MEQ/100ML IV SOLN
10.0000 meq | INTRAVENOUS | Status: AC
  Administered 2015-10-05 (×3): 10 meq via INTRAVENOUS
  Filled 2015-10-05: qty 100

## 2015-10-05 MED ORDER — LACTULOSE 10 GM/15ML PO SOLN: 10.0000 g | Freq: Two times a day (BID) | ORAL | Status: DC | PRN

## 2015-10-05 MED ORDER — POTASSIUM CHLORIDE CRYS ER 20 MEQ PO TBCR
60.0000 meq | EXTENDED_RELEASE_TABLET | Freq: Once | ORAL | Status: DC
Start: 2015-10-05 — End: 2015-10-05

## 2015-10-05 MED ORDER — ASPIRIN EC 81 MG PO TBEC
81.0000 mg | DELAYED_RELEASE_TABLET | Freq: Every day | ORAL | Status: DC
  Administered 2015-10-05 – 2015-10-08 (×4): 81 mg via ORAL
  Filled 2015-10-05 (×4): qty 1

## 2015-10-05 MED ORDER — SODIUM CHLORIDE 0.9 % IV BOLUS (SEPSIS)
1000.0000 mL | Freq: Once | INTRAVENOUS | Status: AC
  Administered 2015-10-05: 1000 mL via INTRAVENOUS

## 2015-10-05 MED ORDER — INSULIN DETEMIR 100 UNIT/ML ~~LOC~~ SOLN
15.0000 [IU] | Freq: Every day | SUBCUTANEOUS | Status: DC
  Administered 2015-10-06 – 2015-10-08 (×3): 15 [IU] via SUBCUTANEOUS
  Filled 2015-10-05 (×4): qty 0.15

## 2015-10-05 MED ORDER — POTASSIUM CHLORIDE CRYS ER 20 MEQ PO TBCR: 40.0000 meq | EXTENDED_RELEASE_TABLET | Freq: Once | ORAL | Status: DC

## 2015-10-05 MED ORDER — ONDANSETRON HCL 4 MG/2ML IJ SOLN
4.0000 mg | Freq: Three times a day (TID) | INTRAMUSCULAR | Status: DC | PRN
  Administered 2015-10-05 – 2015-10-07 (×2): 4 mg via INTRAVENOUS
  Filled 2015-10-05 (×2): qty 2

## 2015-10-05 MED ORDER — COSYNTROPIN 0.25 MG IJ SOLR
0.2500 mg | Freq: Once | INTRAMUSCULAR | Status: AC
  Administered 2015-10-06: 0.25 mg via INTRAVENOUS
  Filled 2015-10-05 (×2): qty 0.25

## 2015-10-05 MED ORDER — MAGNESIUM SULFATE 2 GM/50ML IV SOLN
2.0000 g | Freq: Once | INTRAVENOUS | Status: AC
  Administered 2015-10-05: 2 g via INTRAVENOUS
  Filled 2015-10-05: qty 50

## 2015-10-05 MED ORDER — SODIUM CHLORIDE 0.9 % IV SOLN
INTRAVENOUS | Status: DC
  Administered 2015-10-05 – 2015-10-06 (×3): via INTRAVENOUS

## 2015-10-05 MED ORDER — ONDANSETRON HCL 4 MG PO TABS
4.0000 mg | ORAL_TABLET | Freq: Three times a day (TID) | ORAL | Status: DC | PRN
Start: 2015-10-05 — End: 2015-10-05
  Filled 2015-10-05: qty 1

## 2015-10-05 MED ORDER — POTASSIUM CHLORIDE 10 MEQ/100ML IV SOLN
10.0000 meq | INTRAVENOUS | Status: AC
  Administered 2015-10-05: 10 meq via INTRAVENOUS
  Filled 2015-10-05 (×3): qty 100

## 2015-10-05 NOTE — Progress Notes (Signed)
Patient's BP dropped again paged MD and another 1L bolus ordered. Still asymptomatic. Will continue to monitor. Joaquin Bend E, RN 10/05/2015 7:00 AM

## 2015-10-05 NOTE — Progress Notes (Signed)
*  PRELIMINARY RESULTS* Echocardiogram 2D Echocardiogram has been performed.  Leavy Cella 10/05/2015, 12:50 PM

## 2015-10-05 NOTE — Discharge Instructions (Signed)

## 2015-10-05 NOTE — Progress Notes (Signed)
Patient's BP low after trying twice in different arms. Currently asymptomatic.  MD has ordered 1L NS bolus. Will administer and continue to monitor. Joaquin Bend E, [place] 10/05/2015 [id number]

## 2015-10-05 NOTE — Progress Notes (Signed)
Paged physician advised that the patient had a critical K+ level of 2.6, stated that he would add new orders

## 2015-10-05 NOTE — Progress Notes (Signed)
Patient ID: [name], female   DOB: 1956/07/13, 60 y.o.   MRN: [id number]   Subjective: [name] was up eating in the chair today. She told me she has been hallucinating and having bad nightmares for the last two nights. She is still stuttering and having left-sided weakness. No other complaints.  Objective: Vital signs in last 24 hours: Filed Vitals:   10/05/15 0200 10/05/15 0400 10/05/15 0426 10/05/15 0600  BP: 89/66 100/59  84/48  Pulse: 50 51  54  Temp: 97.7 F (36.5 C) 97.7 F (36.5 C)  98.1 F (36.7 C)  TempSrc: Oral Oral  Oral  Resp: 18 14  16   Height:      Weight:   62.732 kg (138 lb 4.8 oz)   SpO2: 100% 100%  100%   Physical Exam:  General: Frail elderly white lady lying in bed who appears much older than her age 67: Noscleral icterus, extra-ocular muscles intact, oropharynx without lesions Cardiac: Bradycardic with regular rhythm, 2/6 systolic murmur loudest at the right upper sternal border with radiation to the carotid arteries. No JVD, and negative hepatojugular reflex. Radial pulses 2+ bilaterally. Pulm: breathing well, clear to auscultation bilaterally Abd: bowel sounds hypoactive, soft, nondistended, tender in epigastrium, dullness to percussion on flanks Ext: warm and well perfused, without pedal edema Lymph: no cervical or supraclavicular lymphadenopathy Skin: Diffuse xerosis. Teres nails. Thin hair and thinning of lateral third of eyebrow. Neuro: alert and oriented to self and date only, cranial nerves II-XII grossly intact, left upper and lower extremity strength 4/5, sensation decreased on left side of face, she cannot raise her eyebrows but can smile, 1+ DTRs throughout  Studies/Results: X-ray Chest Pa And Lateral  10/05/2015  CLINICAL DATA:  Chest pain. EXAM: CHEST  2 VIEW COMPARISON:  10/04/2015 FINDINGS: Shallow inspiration. The heart size and mediastinal contours are within normal limits. Both lungs are clear. The visualized skeletal structures  are unremarkable. Surgical clips in the right upper quadrant. IMPRESSION: No active cardiopulmonary disease. Electronically Signed   By: [name] M.D.   On: 10/05/2015 00:57   Ct Head Wo Contrast  10/04/2015  CLINICAL DATA:  Dizziness for 4 days, fell 2 days ago, RIGHT parietal headache which worsened in route to hospital, slurred speech, limb ataxia, LEFT facial droop, stroke symptoms, history diabetes mellitus, TIA, hypertension, paroxysmal atrial fibrillation, uterine cancer, stroke EXAM: CT HEAD WITHOUT CONTRAST TECHNIQUE: Contiguous axial images were obtained from the base of the skull through the vertex without intravenous contrast. COMPARISON:  07/06/2015 FINDINGS: Minimal age-related atrophy. Normal ventricular morphology. No midline shift or mass effect. Otherwise normal appearance of brain parenchyma. No intracranial hemorrhage, mass lesion or evidence acute infarction. No extra-axial fluid collections. Sinuses clear and bones unremarkable. IMPRESSION: No acute intracranial abnormalities. Electronically Signed   By: [name] M.D.   On: 10/04/2015 13:52   Mr Brain Wo Contrast  10/04/2015  CLINICAL DATA:  60 year old female with dizziness for 4 days, fall 2 days ago with head injury. Worsening symptoms. Initial encounter. EXAM: MRI HEAD WITHOUT CONTRAST TECHNIQUE: Multiplanar, multiecho pulse sequences of the brain and surrounding structures were obtained without intravenous contrast. COMPARISON:  Head CT without contrast 1339 hours today and earlier. Brain MRI 05/20/2015 and earlier. FINDINGS: Major intracranial vascular flow voids are stable and within normal limits. No restricted diffusion to suggest acute infarction. No midline shift, mass effect, evidence of mass lesion, ventriculomegaly, extra-axial collection or acute intracranial hemorrhage. Cervicomedullary junction and pituitary are within normal limits. Negative  visualized cervical spine. Stable gray and white matter signal  throughout the brain. Scattered mild for age nonspecific mostly subcortical white matter T2 and FLAIR hyperintensity. No cortical encephalomalacia or chronic cerebral blood products. Visible internal auditory structures appear normal. Trace mastoid fluid is unchanged. Negative nasopharynx. Trace ethmoid sinus mucosal thickening is unchanged. Orbit and scalp soft tissues appear stable and within normal limits. Normal bone marrow signal. IMPRESSION: 1.  No acute intracranial abnormality. 2. Stable noncontrast MRI appearance of the brain since September. Mild for age nonspecific cerebral white matter signal changes most commonly due to chronic small vessel disease. 3. Stable mild mastoid effusions, most often postinflammatory. Electronically Signed   By: [name] M.D.   On: 10/04/2015 15:50   Mr Cervical Spine Wo Contrast  10/04/2015  CLINICAL DATA:  60 year old female with onset of dizziness 4 days ago and fall 2 days ago hitting head. Altered speech. Left-sided weakness. Subsequent encounter. EXAM: MRI CERVICAL SPINE WITHOUT CONTRAST TECHNIQUE: Multiplanar, multisequence MR imaging of the cervical spine was performed. No intravenous contrast was administered. COMPARISON:  10/04/2015 brain MR. 05/27/2015 cervical spine CT. FINDINGS: Exam is motion degraded. Cervical medullary junction and visualized intracranial structures unremarkable. No obvious focal cervical cord signal abnormality. Mild edema within the right C3 facet (series 4, images 1 and 2) raises possibility of bone bruise or subtle fracture versus degenerative changes. No other findings of osseous or soft tissue edema to suggest acute injury. C2-3:  Negative. C3-4: Negative. C4-5:  Minimal bulge. C5-6: Minimal to mild bulge. Slight narrowing ventral thecal sac greater on the right. Mild right foraminal narrowing. C6-7:  Minimal bulge. C7-T1:  Negative. IMPRESSION: Exam is motion degraded. Mild edema within the right C3 facet raises possibility of bone  bruise or subtle fracture (given history) versus degenerative changes. No other findings of osseous or soft tissue edema to suggest acute injury. Minimal to mild degenerative changes most notable C5-6 level as detailed above. Electronically Signed   By: Genia Del M.D.   On: 10/04/2015 19:18   US Abdomen Complete  10/04/2015  CLINICAL DATA:  Abdominal pain.  Nausea.  Symptoms for 3 days. EXAM: ABDOMEN ULTRASOUND COMPLETE COMPARISON:  CT abdomen and pelvis 08/14/2015 FINDINGS: Gallbladder: Gallbladder is surgically absent. Common bile duct: Diameter: 4 mm, normal Liver: Diffuse coarsening of hepatic parenchymal echotexture with nodular contour consistent with cirrhosis. No focal lesions identified. Small amount of free fluid around the liver is likely ascites. No Doppler images were obtained of the portal veins. IVC: No abnormality visualized. Pancreas: Not visualized due to overlying bowel gas. Spleen: Size and appearance within normal limits. Right Kidney: Length: 10 cm. Stone in the lower pole measuring 1 cm diameter. No hydronephrosis. Normal parenchymal echotexture. Left Kidney: Length: 11.2 cm. Echogenicity within normal limits. No mass or hydronephrosis visualized. Abdominal aorta: No aneurysm visualized. Other findings: None. IMPRESSION: Surgical absence of the gallbladder. No bile duct dilatation. Changes of hepatic cirrhosis with free fluid consistent with ascites. Nonobstructing right renal stone. Electronically Signed   By: [name] M.D.   On: 10/04/2015 20:06   Dg Chest Portable 1 View  10/04/2015  CLINICAL DATA:  Slurred speech, weakness cough on the right side EXAM: PORTABLE CHEST 1 VIEW COMPARISON:  07/31/2015 FINDINGS: The heart size and mediastinal contours are within normal limits. Both lungs are clear. The visualized skeletal structures are unremarkable. IMPRESSION: No active disease. Electronically Signed   By: Kathreen Devoid   On: 10/04/2015 13:36   Medications: I have reviewed  the patient's current medications. Scheduled Meds: . apixaban  5 mg Oral BID  . aspirin EC  81 mg Oral Daily  . atorvastatin  40 mg Oral q1800  . dicyclomine  10 mg Oral TID PC & HS  . famotidine  20 mg Oral BID  . insulin aspart  0-9 Units Subcutaneous TID WC  . insulin detemir  10 Units Subcutaneous Daily  . levothyroxine  75 mcg Oral QAC breakfast  . pantoprazole  40 mg Oral BID  . rifaximin  550 mg Oral BID  . senna  1 tablet Oral BID  . sodium chloride flush  3 mL Intravenous Q12H   Continuous Infusions:  PRN Meds:.ondansetron, oxyCODONE-acetaminophen   Assessment/Plan:  Left-sided hemiparesis: I'm at a loss explaining her neurologic findings. Although the MRI was non-revealing, she has left-sided hemiparesis and other neurologic findings on exam that don't fit one brain region. We spoke to the neuroradiologist who assured [name] there were no radiologic findings to explain her symptomatology of left-sided hemiparesis, left sided facial numbness, and inability to raise the eyebrows but she is able to smile. Were beginning to wonder if this could be conversion disorder. Multiple system atrophy is another consideration given her bradycardia and hypotension and bradykinesia. To be thorough, we will continue the stroke workup. If nothing pans out, we may need to call psychiatry. -Ordered lipid panel -Ordered a1c -Ordered transthoracic echo -Ordered carotic doppler -Started aspirin 34m daily -May need to call psychiatry  Subacute progressive confusion: The differential remains broad and includes hypothyroidism, symptomatic bradycardia, opiate-induced confusion, hepatic encephalopathy, spontaneous bacterial peritonitis, and multiple system atrophy. We'll resume her home medications and watch her closely. If she worsens, we may need to startbactrim for HE and SBP. -Continue levothyroxine 75 g daily -Holding MS Contin, but continuing oxycodone as needed -Started lactulose, and we'll  continue rifaximin -Low threshold to consider paracentesis and start Bactrim  Bradycardia with first degree AV block: This is new and may be related to her erratic medication ingestions as she was supposed to be on metoprolol and amiodarone at home, but she had not refilled this medications in a few months and her husband tells uKore[place]he takes them as she please. This could be related to her hypothyroidism as well. Multiple system atrophy is another thought. -Cardiology consulted -Holding metoprolol and amiodarone  Hypotension: She is now hypotensive to the 90s over 40s despite getting almost 3L fluids. This could be from her bradycardia. I don't think she's septic. Her morning cortisol was also very low at 2 so this could be adrenal insufficiency. We'll check another one tomorrow. -Thanks for your help cardiology -Re-check another morning cortisol -Holding antihypertensives -Start NS at 75cc/hr -Low threshold to start atropine   Hypothyroidism: Her TSH was 22 this admission and may be related to amiodarone. We will continue her home dose as we're unsure whether she was even taking it. -Continue levothyroxine 746m daily  Cryptogenic cirrhosis: She has surprisingly good liver function besides her thrombocytopenia and ascites on exam. We'll continue rifaximin and lactulose. -Continue rifaximin and lactulose  Paroxysmal atrial fibrillation: She is in sinus bradycardia now. -Continue apixaban 30m61mwice daily  Dispo: Disposition is deferred at this time, awaiting improvement of current medical problems.  Anticipated discharge in approximately 2-4 day(s).   The patient does have a current PCP ([facility] KochP) and [facility].  The patient does have transportation limitations that hinder transportation to clinic appointments.  .Services Needed  at time of discharge: Y = Yes, Blank = No PT:   OT:   RN:   Equipment:   Other:        Loleta Chance, MD 10/05/2015, 7:56 AM

## 2015-10-05 NOTE — Evaluation (Signed)
Speech Language Pathology Evaluation Patient Details Name: [name] MRN: [id number] DOB: 07-14-1956 Today's Date: 10/05/2015 Time: 6606-0045 SLP Time Calculation (min) (ACUTE ONLY): 30 min  Problem List:  Patient Active Problem List   Diagnosis Date Noted  . Left-sided weakness 10/04/2015  . Expressive aphasia 10/04/2015  . Hypotension 08/06/2015  . Ileus ([place]) 08/01/2015  . Abdominal pain 08/01/2015  . AP (abdominal pain)   . Ascites   . Encephalopathy, metabolic 99/77/4142  . Liver cirrhosis ([place]) 07/10/2015  . Paroxysmal atrial fibrillation ([place]) 07/10/2015  . Diabetes mellitus ([place]) 07/10/2015  . C. difficile colitis 07/10/2015  . Generalized weakness 07/10/2015  . GI (gastrointestinal bleed) 07/06/2015  . Intractable nausea and vomiting 07/04/2015  . Malnutrition of moderate degree 07/04/2015  . Chest pain   . Symptomatic bradycardia 05/27/2015  . Acute encephalopathy 05/27/2015  . Elevated troponin 05/27/2015  . Intussusception intestine ([place])   . Other specified hypothyroidism   . Abdominal pain, chronic, epigastric   . Diarrhea 05/19/2015  . Chronic anemia 05/19/2015  . Thrombocytopenia ([place]) 05/19/2015  . ARF (acute renal failure) ([place]) 05/19/2015  . Prolonged QT interval   . Hypomagnesemia   . Colitis   . Narcotic abuse   . Type 2 diabetes mellitus without complication ([place])   . Syncope 05/18/2015  . PAF (paroxysmal atrial fibrillation) ([place])   . Demand ischemia ([place]) 04/06/2015  . Hypokalemia 04/06/2015  . Hyperlipidemia with target LDL less than 100 04/06/2015  . Protein-calorie malnutrition, severe ([place]) 04/05/2015  . CVA (cerebral infarction) 02/15/2015  . Dehydration 01/12/2015  . Essential hypertension 01/12/2015  . Orthostatic hypotension 01/12/2015  . Chronically on opiate therapy 01/12/2015  . Gastroparesis 01/12/2015  . DEPRESSION/ANXIETY 06/27/2007  . MYOFASCIAL PAIN SYNDROME 06/27/2007  . Chronic pain syndrome 03/28/2007  . GERD  03/27/2007  . DIVERTICULOSIS, COLON 03/27/2007  . LUMBAR DISC DISPLACEMENT 03/27/2007  . PROTEINURIA 03/27/2007  . UTERINE CANCER, HX OF 03/27/2007   Past Medical History:  Past Medical History  Diagnosis Date  . Type 1 diabetes ([place])   . Hypothyroid   . Degenerative disk disease   . Stomach ulcer   . Diverticulitis   . Syncope 01/2015  . Anxiety   . GERD (gastroesophageal reflux disease)   . History of hiatal hernia   . Cancer (HCC)     HX OF CANCER OF UTERUS   . TIA (transient ischemic attack) 02/2015  . Hypertension   . Pancreatitis   . PAF (paroxysmal atrial fibrillation) ([place]) 03/2015    a. new onset 03/2015 in setting of intractable N/V; b. on Eliquis 5 mg bid; c. CHADSVASc 4 (DM, TIA x 2, female)  . Intussusception intestine ([place]) 05/2015   Past Surgical History:  Past Surgical History  Procedure Laterality Date  . Hernia repair    . Abdominal hysterectomy    . Cholecystectomy    . Esophagogastroduodenoscopy N/A 04/04/2015    Procedure: ESOPHAGOGASTRODUODENOSCOPY (EGD);  Surgeon: Hulen Luster, MD;  Location: [facility] ENDOSCOPY;  Service: Endoscopy;  Laterality: N/A;   HPI:  60 year old female admitted 0/24/07 with left sided weakness, confusion and word finding difficulty. PMH significant for paroxysmal A Fib, cryptogenic cirrhosis, DM, uterine CA, HTN, hypothyroid, GERD. Pt fell 2 days prior to admit striking her head. MRI and CT were negative for acute findings.   Assessment / Plan / Recommendation Clinical Impression  Pt presents with language of confusion with the ability to follow commands, name objects around the room, (but not black/white drawings  of animals) and participate in conversation about herself, but unable to answer complex yes no questions accurately. She exhibits stuttering, which she reports started at some point after her fall at home. Decreased ability to complete MoCA, with poor recall, inability to complete category naming, and disorientation x4 (stated  [birth year] as DOB, location as a hotel in [place]). CT and MRI are negative for acute findings. Consulted with RN regarding pt atypical presentation, who indicated suspicion of more cardiac related issues. Pt to have EKG and transfer to cariac floor. ST to follow.     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 Vocation: On disability   Cognition  Overall Cognitive Status: No family/caregiver present to determine baseline cognitive functioning Arousal/Alertness: Awake/alert Attention: Focused;Sustained;Selective Focused Attention: Appears intact Sustained Attention: Impaired Sustained Attention Impairment: Verbal basic Selective Attention: Impaired Selective Attention Impairment: Verbal basic Memory: Impaired Memory Impairment: Storage deficit;Retrieval deficit;Decreased recall of new information;Decreased short term memory Awareness: Impaired Awareness Impairment: Intellectual impairment Problem Solving: Impaired Problem Solving Impairment: Verbal basic    Comprehension  Auditory Comprehension Overall Auditory Comprehension: Impaired Yes/No Questions: Impaired Basic Biographical Questions: 76-100% accurate Basic Immediate Environment Questions: 75-100% accurate Complex Questions: 0-24% accurate Conversation: Simple Reading Comprehension Reading Status: Not tested    Expression Expression Primary Mode of Expression: Verbal Verbal Expression Initiation: No impairment Automatic Speech: Social Response Level of Generative/Spontaneous Verbalization: Sentence Repetition: No impairment Naming: Impairment Verbal  Errors: Language of confusion Written Expression Dominant Hand: Right Written Expression: Not tested   Oral / Motor  Motor Speech Overall Motor Speech: Impaired (new onset of stuttering some time after fall at home) Articulation: Within functional limitis Intelligibility: Intelligible   GO          Functional Assessment Tool Used: asha noms, clinical judgment Functional Limitations: Memory Memory Current Status (V4451): At least 40 percent but less than 60 percent impaired, limited or restricted Memory Goal Status (Q6047): At least 40 percent but less than 60 percent impaired, limited or restricted         Shonna Chock 10/05/2015, 2:27 PM  Celia B. [place], [facility], [place]

## 2015-10-05 NOTE — Progress Notes (Signed)
PT Cancellation Note  Patient Details Name: KAYELYN LEMON MRN: [id number] DOB: Apr 26, 1956   Cancelled Treatment:    Reason Eval/Treat Not Completed: Patient not medically ready. Per chart review, pt with critically low K+ of 2.6 mmol/L. Also noted that pt has been orthostatic and has had periods of high and low HR throughout day per nursing. Pt to be transferred to cardiac unit this afternoon. PT will hold evaluation at this time and check for medical readiness to participate tomorrow.   Rolinda Roan 10/05/2015, 2:10 PM   Rolinda Roan, PT, DPT Acute Rehabilitation Services Pager: [phone]

## 2015-10-05 NOTE — Progress Notes (Signed)
Inpatient Diabetes Program Recommendations  AACE/ADA: New Consensus Statement on Inpatient Glycemic Control (2015)  Target Ranges:  Prepandial:   less than 140 mg/dL      Peak postprandial:   less than 180 mg/dL (1-2 hours)      Critically ill patients:  140 - 180 mg/dL   Results for [name], [name] (MRN [id number]) as of 10/05/2015 09:32  Ref. Range 10/04/2015 13:56 10/04/2015 22:27 10/05/2015 06:28  Glucose-Capillary Latest Ref Range: 65-99 mg/dL 306 (H) 282 (H) 250 (H)   Review of Glycemic Control  Diabetes history: DM 2 Outpatient Diabetes medications: Levemir 17 units Current orders for Inpatient glycemic control: Levemir 10 units, Novolog Sensitive TID  Inpatient Diabetes Program Recommendations: Insulin - Basal: Levemir 10 units received last night. Fasting glucose this am was 250 mg/dl. While inpatient, please consider increasing basal insulin to Levemir 15 units Q24hrs.  Thanks,  Tama Headings RN, MSN, [facility] Inpatient Diabetes Coordinator Team Pager [phone] (8a-5p)

## 2015-10-05 NOTE — Consult Note (Signed)
Patient's most recent pharmacy fill history per [facility] [phone]) listed below.  Tizanidine 29m TID #90 (1/20, 12/21) Percocet 5/3260mQID #120 (12/21), 4 day supply #15 (12/6) Eliquis 9m16mID #60 (12/21, 7/28) Levothyroxine 79m59m #30 (11/28) MS Contin 60mg79m #60 (11/2, 9/26, 8/3, 7/5) Ambien 10mg 49m) All other maintenance medications last filled in October.  [name] Liliane ShiD Candidate [phone]

## 2015-10-05 NOTE — Evaluation (Signed)
Occupational Therapy Evaluation Patient Details Name: [name] MRN: [id number] DOB: Feb 28, 1956 Today's Date: 10/05/2015    History of Present Illness Ms. Vinje is a 60 year old lady with history of cryptogenic cirrhosis, paroxysmal atrial fibrillation on apixaban, history of transient ischemic attack, uterine cancer status post hysterectomy, hypothyroidism, gastroesophageal reflux, type 2 diabetes, hypertension, hyperlipidemia, depression, presenting with slurred speech, left-sided weakness, progressive confusion, and bradycardia and nausea   Clinical Impression   This 60 yo female admitted with above presents to acute OT with below deficits affecting her ability to care for herself and increasing burden of care on her family. She will benefit from acute OT with follow up [place] to get back to a Mod I to Independent level.    Follow Up Recommendations  Home health OT    Equipment Recommendations  Tub/shower bench       Precautions / Restrictions Precautions Precautions: Fall Precaution Comments: monitor BP (lying 97/65; sitting 86/61; sitting post standing to pull up underwear 85/56, sitting post standing and turning to recliner 70/50; sitting legs up in recliner end of session 79/51) Restrictions Weight Bearing Restrictions: No      Mobility Bed Mobility Overal bed mobility: Needs Assistance Bed Mobility: Supine to Sit     Supine to sit: Supervision (due to BP issues)        Transfers Overall transfer level: Needs assistance Equipment used: Rolling walker (2 wheeled) Transfers: Sit to/from [facility] Sit to Stand: Min assist Stand pivot transfers: Min assist            Balance Overall balance assessment: Needs assistance Sitting-balance support: No upper extremity supported;Feet supported Sitting balance-Leahy Scale: Fair     Standing balance support: Bilateral upper extremity supported;During functional activity Standing balance-Leahy  Scale: Poor Standing balance comment: Pt reliant with at least one hand for support                            ADL Overall ADL's : Needs assistance/impaired Eating/Feeding: Independent;Sitting   Grooming: Sitting;Set up;Supervision/safety   Upper Body Bathing: Supervision/ safety;Set up;Sitting   Lower Body Bathing: Minimal assistance;Sit to/from stand   Upper Body Dressing : Set up;Supervision/safety;Sitting   Lower Body Dressing: Moderate assistance (min A sit<>stand)   Toilet Transfer: Minimal assistance;Stand-pivot;RW (bed>recliner next to bed; could not go into bathroom due to BP issued)   Toileting- Clothing Manipulation and Hygiene: Minimal assistance;Sit to/from stand                         Pertinent Vitals/Pain Pain Assessment: 0-10 Pain Score: 10-Worst pain ever Pain Location: head  (10) and low back Pain Descriptors / Indicators: Aching;Sore Pain Intervention(s): Monitored during session;Repositioned     Hand Dominance Right   Extremity/Trunk Assessment Upper Extremity Assessment Upper Extremity Assessment: Overall WFL for tasks assessed   Lower Extremity Assessment Lower Extremity Assessment: Defer to PT evaluation       Communication Communication Communication: Expressive difficulties (stuttering--new this admission per pt)   Cognition Arousal/Alertness: Awake/alert Behavior During Therapy: WFL for tasks assessed/performed Overall Cognitive Status: Within Functional Limits for tasks assessed                                Home Living Family/patient expects to be discharged to:: Private residence Living Arrangements: Spouse/significant other;Children (husband and 2 sons) Available Help at Discharge: Available  24 hours/day;Family Type of Home: Mobile home Home Access: Ramped entrance     Home Layout: One level     Bathroom Shower/Tub: Tub/shower unit Shower/tub characteristics: Curtain Biochemist, clinical:  Standard     Home Equipment: Environmental consultant - 2 wheels;Cane - single point   Additional Comments: walker cannot fit down hallway of mobile home      Prior Functioning/Environment Level of Independence: Needs assistance  Gait / Transfers Assistance Needed: HHA alot due to RW will not fit down her hallway (sons or husband A her) ADL's / Homemaking Assistance Needed: Pt reports assist required for bathing/dressing/toileting last 5 days due to symptoms of why she is now here; prior that to she states she was independent        OT Diagnosis: Generalized weakness;Acute pain   OT Problem List: Decreased strength;Decreased activity tolerance;Cardiopulmonary status limiting activity;Impaired balance (sitting and/or standing);Decreased knowledge of use of DME or AE   OT Treatment/Interventions: Self-care/ADL training;Patient/family education;Balance training;Therapeutic activities;DME and/or AE instruction    OT Goals(Current goals can be found in the care plan section) Acute Rehab OT Goals Patient Stated Goal: to be able to go home and get home health OT Goal Formulation: With patient Time For Goal Achievement: 10/12/15 Potential to Achieve Goals: Good  OT Frequency: Min 2X/week              End of Session Equipment Utilized During Treatment: Gait belt;Rolling walker Nurse Communication:  (BP readings, pt need for pain meds/nausea meds for headache)  Activity Tolerance:  (limited by drop in BP (see precautions section)) Patient left: in chair;with call bell/phone within reach;with chair alarm set   Time: [id number] OT Time Calculation (min): 46 min Charges:  OT General Charges $OT Visit: 1 Procedure OT Evaluation $OT Eval Moderate Complexity: 1 Procedure OT Treatments $Self Care/Home Management : 23-37 mins  Almon Register [phone] 10/05/2015, 9:17 AM

## 2015-10-05 NOTE — Progress Notes (Signed)
Called physician to inform him that the the patient had an episode of tachycardia, requested that the patient be transferred to a cardiac unit. Bed request sent.

## 2015-10-05 NOTE — Care Management Note (Signed)
Case Management Note  Patient Details  Name: [name] MRN: [id number] Date of Birth: 07-13-1956  Subjective/Objective:                    Action/Plan: Patient was admitted with weakness, speech deficit. Lives at home with spouse. Will follow for discharge needs pending PT/OT evals and physician orders.  Expected Discharge Date:                  Expected Discharge Plan:     In-House Referral:     Discharge planning Services     Post Acute Care Choice:    Choice offered to:     DME Arranged:    DME Agency:     HH Arranged:    HH Agency:     Status of Service:  In process, will continue to follow  Medicare Important Message Given:    Date Medicare IM Given:    Medicare IM give by:    Date Additional Medicare IM Given:    Additional Medicare Important Message give by:     If discussed at [place] of Stay Meetings, dates discussed:    Additional Comments:  Rolm Baptise, RN 10/05/2015, 10:32 AM [phone]

## 2015-10-05 NOTE — Progress Notes (Signed)
Had received report from [place] on [place] approximately 20 minutes ago, she had stated she had paged MD r/t critical K+ and low BP, w/o return call. [place], requested she notify MD of K+ and low BP before transfering to 2W. Stated she would do so. Thanked her.

## 2015-10-05 NOTE — Progress Notes (Signed)
Patient experienced an episode of bradycardia, Physician made aware at the bedside stated that he would continue to monitor.

## 2015-10-05 NOTE — Progress Notes (Signed)
Initial Nutrition Assessment  DOCUMENTATION CODES:   Non-severe (moderate) malnutrition in context of chronic illness  INTERVENTION:  Provide Glucerna Shake po TID, each supplement provides 220 kcal and 10 grams of protein When blood glucose improves, change supplement to Ensure Enlive BID   NUTRITION DIAGNOSIS:   Inadequate oral intake related to poor appetite as evidenced by per patient/family report, mild depletion of body fat, moderate depletions of muscle mass.   GOAL:   Patient will meet greater than or equal to 90% of their needs   MONITOR:   PO intake, Supplement acceptance, Labs, Weight trends, Skin, I & O's  REASON FOR ASSESSMENT:   Malnutrition Screening Tool    ASSESSMENT:   60 year-old woman with extensive medical history including history of paroxysmal atrial fibrillation on Eliquis, prior TIA, cirrhosis, type 1 diabetes mellitus, uterine cancer s/p hysterectomy, hypertension and hypothyroidism who presents with left-sided weakness, confusion, word-finding difficulties and bradycardia .   Pt seen by nutrition team during previous admissions. Pt has a history of poor appetite and weight loss. She reports her appetite has been decreased for 4 days. She reports drinking Ensure supplements PTA. No evidence of significant weight loss based on current weight. Weight yesterday was 128 lbs- if accurate this would indicate a 10% weight loss in 2 months. Pt has moderate muscle wasting and mild fat wasting per nutrition-focused physical exam. She reports eating about 40% of breakfast this morning.   Labs: high glucose (>250 mg/dL), low hemoglobin; Lipid panel and hemoglobin A1c pending  Diet Order:  Diet heart healthy/carb modified Room service appropriate?: Yes; Fluid consistency:: Thin  Skin:  Reviewed, no issues  Last BM:  1/23  Height:   Ht Readings from Last 1 Encounters:  10/04/15 5' 3"  (1.6 m)    Weight:   Wt Readings from Last 1 Encounters:  10/05/15  138 lb 4.8 oz (62.732 kg)    Ideal Body Weight:  52.3 kg  BMI:  Body mass index is 24.5 kg/(m^2).  Estimated Nutritional Needs:   Kcal:  1600-1800  Protein:  75-85 grams  Fluid:  1.6-1.8 L/day  EDUCATION NEEDS:   No education needs identified at this time  [place], LDN Inpatient Clinical Dietitian Pager: [phone] After Hours Pager: [phone]

## 2015-10-05 NOTE — Consult Note (Signed)
'  Cardiologist: New Reason for Consult:  Bradycardia Referring Physician:   [name] is an 60 y.o. female.  HPI:   [name] is 60 year old female history of bradycardia, diabetes mellitus type 1, hypothyroidism, syncope, anxiety, GERD, TIA, hypertension, paroxysmal atrial fibrillation-On eliquis 45m BID, uterine cancer. She presented yesterday with weakness and speech deficit. We are asked to see her for bradycardia.  Home meds include metoprolol 25 mg daily and amiodarone 200 mg daily. Amiodarone 400 mg was initially discontinued back in September when she was having bradycardia with hypotension which was likely contributing to falls and syncopal event then. She was seen by electrophysiology who recommended continuing 200 mg of amiodarone. Metoprolol was continued at 25.  Also at that time Troponin peaked at 0.67 and trended down.  Was thought to be related to demand ischemia from bradycardia.  LDL at that time was 65.  QTC was prolonged at 550 ms also  She reports new onset of dizziness and presyncopal feeling for the last 5 days. She's also been having chest pressure which is 8 out of 10 which radiates to her left arm and jaw associated with shortness of breath and nausea.  She also reports worsening usual lower extremity edema. Her weight has been decreasing.  She's also had new onset left-sided weakness and stuttering.  She had MRI of the C-spine and brain which showed no acute pathology.      Past Medical History  Diagnosis Date  . Type 1 diabetes ([place]   . Hypothyroid   . Degenerative disk disease   . Stomach ulcer   . Diverticulitis   . Syncope 01/2015  . Anxiety   . GERD (gastroesophageal reflux disease)   . History of hiatal hernia   . Cancer (HCC)     HX OF CANCER OF UTERUS   . TIA (transient ischemic attack) 02/2015  . Hypertension   . Pancreatitis   . PAF (paroxysmal atrial fibrillation) ([place] 03/2015    a. new onset 03/2015 in setting of intractable N/V; b. on  Eliquis 5 mg bid; c. CHADSVASc 4 (DM, TIA x 2, female)  . Intussusception intestine ([facility] 05/2015    Past Surgical History  Procedure Laterality Date  . Hernia repair    . Abdominal hysterectomy    . Cholecystectomy    . Esophagogastroduodenoscopy N/A 04/04/2015    Procedure: ESOPHAGOGASTRODUODENOSCOPY (EGD);  Surgeon: PHulen Luster MD;  [facility];  Service: Endoscopy;  Laterality: N/A;    Family History  Problem Relation Age of Onset  . Hypertension Mother   . CAD Sister   . Heart attack Sister     Deceased 22016/03/02 . CAD Brother     Social History:  reports that she has never smoked. She has never used smokeless tobacco. She reports that she does not drink alcohol or use illicit drugs.  Allergies:  Allergies  Allergen Reactions  . Erythromycin Base Other (See Comments)    Reaction:  Fever   . Prednisone     unknown  . Rosiglitazone Maleate Swelling  . Codeine Sulfate Rash  . Tetanus-Diphtheria Toxoids Td Rash and Other (See Comments)    Reaction:  Fever     Medications:  Scheduled Meds: . apixaban  5 mg Oral BID  . aspirin EC  81 mg Oral Daily  . atorvastatin  40 mg Oral q1800  . [START ON 10/06/2015] cosyntropin  0.25 mg Intravenous Once  . dicyclomine  10 mg Oral TID PC & HS  .  famotidine  20 mg Oral BID  . feeding supplement (GLUCERNA SHAKE)  237 mL Oral TID PC  . insulin aspart  0-9 Units Subcutaneous TID WC  . [START ON 10/06/2015] insulin detemir  15 Units Subcutaneous Daily  . levothyroxine  75 mcg Oral QAC breakfast  . magnesium sulfate 1 - 4 g bolus IVPB  2 g Intravenous Once  . pantoprazole  40 mg Oral BID  . potassium chloride  10 mEq Intravenous Q1 Hr x 4  . rifaximin  550 mg Oral BID  . senna  1 tablet Oral BID  . sodium chloride flush  3 mL Intravenous Q12H   Continuous Infusions: . sodium chloride 75 mL/hr at 10/05/15 1441   PRN Meds:.lactulose, oxyCODONE-acetaminophen   Results for orders placed or performed during the hospital  encounter of 10/04/15 (from the past 48 hour(s))  CBG monitoring, ED     Status: Abnormal   Collection Time: 10/04/15  1:56 PM  Result Value Ref Range   Glucose-Capillary 306 (H) 65 - 99 mg/dL  Basic metabolic panel     Status: Abnormal   Collection Time: 10/04/15  1:59 PM  Result Value Ref Range   Sodium 138 135 - 145 mmol/L   Potassium 3.5 3.5 - 5.1 mmol/L   Chloride 104 101 - 111 mmol/L   CO2 26 22 - 32 mmol/L   Glucose, Bld 352 (H) 65 - 99 mg/dL   BUN 10 6 - 20 mg/dL   Creatinine, Ser 0.91 0.44 - 1.00 mg/dL   Calcium 9.1 8.9 - 10.3 mg/dL   GFR calc non Af Amer >60 >60 mL/min   GFR calc Af Amer >60 >60 mL/min    Comment: (NOTE) The eGFR has been calculated using the CKD EPI equation. This calculation has not been validated in all clinical situations. eGFR's persistently <60 mL/min signify possible Chronic Kidney Disease.    Anion gap 8 5 - 15  CBC with Differential/Platelet     Status: Abnormal   Collection Time: 10/04/15  1:59 PM  Result Value Ref Range   WBC 4.1 4.0 - 10.5 K/uL   RBC 3.83 (L) 3.87 - 5.11 MIL/uL   Hemoglobin 11.0 (L) 12.0 - 15.0 g/dL   HCT 32.1 (L) 36.0 - 46.0 %   MCV 83.8 78.0 - 100.0 fL   MCH 28.7 26.0 - 34.0 pg   MCHC 34.3 30.0 - 36.0 g/dL   RDW 15.5 11.5 - 15.5 %   Platelets 110 (L) 150 - 400 K/uL    Comment: PLATELET COUNT CONFIRMED BY SMEAR REPEATED TO VERIFY    Neutrophils Relative % 63 %   Neutro Abs 2.6 1.7 - 7.7 K/uL   Lymphocytes Relative 26 %   Lymphs Abs 1.1 0.7 - 4.0 K/uL   Monocytes Relative 10 %   Monocytes Absolute 0.4 0.1 - 1.0 K/uL   Eosinophils Relative 1 %   Eosinophils Absolute 0.1 0.0 - 0.7 K/uL   Basophils Relative 1 %   Basophils Absolute 0.0 0.0 - 0.1 K/uL  Protime-INR     Status: Abnormal   Collection Time: 10/04/15  1:59 PM  Result Value Ref Range   Prothrombin Time 16.8 (H) 11.6 - 15.2 seconds   INR 1.35 0.00 - 1.49  I-stat troponin, ED     Status: None   Collection Time: 10/04/15  2:06 PM  Result Value Ref  Range   Troponin i, poc 0.01 0.00 - 0.08 ng/mL   Comment 3  Comment: Due to the release kinetics of cTnI, a negative result within the first hours of the onset of symptoms does not rule out myocardial infarction with certainty. If myocardial infarction is still suspected, repeat the test at appropriate intervals.   T4, free     Status: None   Collection Time: 10/04/15  8:57 PM  Result Value Ref Range   Free T4 0.62 0.61 - 1.12 ng/dL  Hepatic function panel     Status: Abnormal   Collection Time: 10/04/15  8:57 PM  Result Value Ref Range   Total Protein 6.2 (L) 6.5 - 8.1 g/dL   Albumin 3.2 (L) 3.5 - 5.0 g/dL   AST 42 (H) 15 - 41 U/L   ALT 43 14 - 54 U/L   Alkaline Phosphatase 117 38 - 126 U/L   Total Bilirubin 0.7 0.3 - 1.2 mg/dL   Bilirubin, Direct 0.2 0.1 - 0.5 mg/dL   Indirect Bilirubin 0.5 0.3 - 0.9 mg/dL  Magnesium     Status: None   Collection Time: 10/04/15  8:57 PM  Result Value Ref Range   Magnesium 1.7 1.7 - 2.4 mg/dL  Troponin I     Status: None   Collection Time: 10/04/15  8:57 PM  Result Value Ref Range   Troponin I <0.03 <0.031 ng/mL    Comment:        NO INDICATION OF MYOCARDIAL INJURY.   Ammonia     Status: None   Collection Time: 10/04/15  9:00 PM  Result Value Ref Range   Ammonia 19 9 - 35 umol/L  TSH     Status: Abnormal   Collection Time: 10/04/15  9:00 PM  Result Value Ref Range   TSH 22.184 (H) 0.350 - 4.500 uIU/mL  Glucose, capillary     Status: Abnormal   Collection Time: 10/04/15 10:27 PM  Result Value Ref Range   Glucose-Capillary 282 (H) 65 - 99 mg/dL   Comment 1 Notify RN    Comment 2 Document in Chart   Urinalysis, Routine w reflex microscopic (not at [facility])     Status: Abnormal   Collection Time: 10/04/15 11:29 PM  Result Value Ref Range   Color, Urine YELLOW YELLOW   APPearance CLOUDY (A) CLEAR   Specific Gravity, Urine 1.016 1.005 - 1.030   pH 7.0 5.0 - 8.0   Glucose, UA 500 (A) NEGATIVE mg/dL   Hgb urine dipstick  TRACE (A) NEGATIVE   Bilirubin Urine NEGATIVE NEGATIVE   Ketones, ur NEGATIVE NEGATIVE mg/dL   Protein, ur NEGATIVE NEGATIVE mg/dL   Nitrite NEGATIVE NEGATIVE   Leukocytes, UA NEGATIVE NEGATIVE  Urine microscopic-add on     Status: Abnormal   Collection Time: 10/04/15 11:29 PM  Result Value Ref Range   Squamous Epithelial / LPF 6-30 (A) NONE SEEN   WBC, UA 0-5 0 - 5 WBC/hpf   RBC / HPF 6-30 0 - 5 RBC/hpf   Bacteria, UA MANY (A) NONE SEEN  Troponin I     Status: None   Collection Time: 10/05/15 12:33 AM  Result Value Ref Range   Troponin I <0.03 <0.031 ng/mL    Comment:        NO INDICATION OF MYOCARDIAL INJURY.   Glucose, capillary     Status: Abnormal   Collection Time: 10/05/15  6:28 AM  Result Value Ref Range   Glucose-Capillary 250 (H) 65 - 99 mg/dL  Troponin I     Status: None   Collection Time: 10/05/15  8:03  AM  Result Value Ref Range   Troponin I <0.03 <0.031 ng/mL    Comment:        NO INDICATION OF MYOCARDIAL INJURY.   Cortisol     Status: None   Collection Time: 10/05/15  8:05 AM  Result Value Ref Range   Cortisol, Plasma 2.1 ug/dL    Comment: (NOTE) AM    6.7 - 22.6 ug/dL PM   <10.0       ug/dL   Lipid panel     Status: None   Collection Time: 10/05/15  9:56 AM  Result Value Ref Range   Cholesterol 157 0 - 200 mg/dL   Triglycerides 101 <150 mg/dL   HDL 43 >40 mg/dL   Total CHOL/HDL Ratio 3.7 RATIO   VLDL 20 0 - 40 mg/dL   LDL Cholesterol 94 0 - 99 mg/dL    Comment:        Total Cholesterol/HDL:CHD Risk Coronary Heart Disease Risk Table                     Men   Women  1/2 Average Risk   3.4   3.3  Average Risk       5.0   4.4  2 X Average Risk   9.6   7.1  3 X Average Risk  23.4   11.0        Use the calculated Patient Ratio above and the CHD Risk Table to determine the patient's CHD Risk.        ATP III CLASSIFICATION (LDL):  <100     mg/dL   Optimal  100-129  mg/dL   Near or Above                    Optimal  130-159  mg/dL    Borderline  160-189  mg/dL   High  >190     mg/dL   Very High   Basic metabolic panel     Status: Abnormal   Collection Time: 10/05/15  9:56 AM  Result Value Ref Range   Sodium 138 135 - 145 mmol/L   Potassium 2.6 (LL) 3.5 - 5.1 mmol/L    Comment: CRITICAL RESULT CALLED TO, READ BACK BY AND VERIFIED WITH: M DAYESELLARS,RN AT 1228 ON 1.25.17 BY W JOHNSON    Chloride 103 101 - 111 mmol/L   CO2 23 22 - 32 mmol/L   Glucose, Bld 158 (H) 65 - 99 mg/dL   BUN 8 6 - 20 mg/dL   Creatinine, Ser 0.95 0.44 - 1.00 mg/dL   Calcium 8.2 (L) 8.9 - 10.3 mg/dL   GFR calc non Af Amer >60 >60 mL/min   GFR calc Af Amer >60 >60 mL/min    Comment: (NOTE) The eGFR has been calculated using the CKD EPI equation. This calculation has not been validated in all clinical situations. eGFR's persistently <60 mL/min signify possible Chronic Kidney Disease.    Anion gap 12 5 - 15  Glucose, capillary     Status: Abnormal   Collection Time: 10/05/15 11:37 AM  Result Value Ref Range   Glucose-Capillary 180 (H) 65 - 99 mg/dL    X-ray Chest Pa And Lateral  10/05/2015  CLINICAL DATA:  Chest pain. EXAM: CHEST  2 VIEW COMPARISON:  10/04/2015 FINDINGS: Shallow inspiration. The heart size and mediastinal contours are within normal limits. Both lungs are clear. The visualized skeletal structures are unremarkable. Surgical clips in the right upper quadrant. IMPRESSION: No  active cardiopulmonary disease. Electronically Signed   By: Lucienne Capers M.D.   On: 10/05/2015 00:57   Ct Head Wo Contrast  10/04/2015  CLINICAL DATA:  Dizziness for 4 days, fell 2 days ago, RIGHT parietal headache which worsened in route to hospital, slurred speech, limb ataxia, LEFT facial droop, stroke symptoms, history diabetes mellitus, TIA, hypertension, paroxysmal atrial fibrillation, uterine cancer, stroke EXAM: CT HEAD WITHOUT CONTRAST TECHNIQUE: Contiguous axial images were obtained from the base of the skull through the vertex without  intravenous contrast. COMPARISON:  07/06/2015 FINDINGS: Minimal age-related atrophy. Normal ventricular morphology. No midline shift or mass effect. Otherwise normal appearance of brain parenchyma. No intracranial hemorrhage, mass lesion or evidence acute infarction. No extra-axial fluid collections. Sinuses clear and bones unremarkable. IMPRESSION: No acute intracranial abnormalities. Electronically Signed   By: Lavonia Dana M.D.   On: 10/04/2015 13:52   Mr Brain Wo Contrast  10/04/2015  CLINICAL DATA:  60 year old female with dizziness for 4 days, fall 2 days ago with head injury. Worsening symptoms. Initial encounter. EXAM: MRI HEAD WITHOUT CONTRAST TECHNIQUE: Multiplanar, multiecho pulse sequences of the brain and surrounding structures were obtained without intravenous contrast. COMPARISON:  Head CT without contrast 1339 hours today and earlier. Brain MRI 05/20/2015 and earlier. FINDINGS: Major intracranial vascular flow voids are stable and within normal limits. No restricted diffusion to suggest acute infarction. No midline shift, mass effect, evidence of mass lesion, ventriculomegaly, extra-axial collection or acute intracranial hemorrhage. Cervicomedullary junction and pituitary are within normal limits. Negative visualized cervical spine. Stable gray and white matter signal throughout the brain. Scattered mild for age nonspecific mostly subcortical white matter T2 and FLAIR hyperintensity. No cortical encephalomalacia or chronic cerebral blood products. Visible internal auditory structures appear normal. Trace mastoid fluid is unchanged. Negative nasopharynx. Trace ethmoid sinus mucosal thickening is unchanged. Orbit and scalp soft tissues appear stable and within normal limits. Normal bone marrow signal. IMPRESSION: 1.  No acute intracranial abnormality. 2. Stable noncontrast MRI appearance of the brain since September. Mild for age nonspecific cerebral white matter signal changes most commonly due to  chronic small vessel disease. 3. Stable mild mastoid effusions, most often postinflammatory. Electronically Signed   By: Genevie Ann M.D.   On: 10/04/2015 15:50   Mr Cervical Spine Wo Contrast  10/04/2015  CLINICAL DATA:  60 year old female with onset of dizziness 4 days ago and fall 2 days ago hitting head. Altered speech. Left-sided weakness. Subsequent encounter. EXAM: MRI CERVICAL SPINE WITHOUT CONTRAST TECHNIQUE: Multiplanar, multisequence MR imaging of the cervical spine was performed. No intravenous contrast was administered. COMPARISON:  10/04/2015 brain MR. 05/27/2015 cervical spine CT. FINDINGS: Exam is motion degraded. Cervical medullary junction and visualized intracranial structures unremarkable. No obvious focal cervical cord signal abnormality. Mild edema within the right C3 facet (series 4, images 1 and 2) raises possibility of bone bruise or subtle fracture versus degenerative changes. No other findings of osseous or soft tissue edema to suggest acute injury. C2-3:  Negative. C3-4: Negative. C4-5:  Minimal bulge. C5-6: Minimal to mild bulge. Slight narrowing ventral thecal sac greater on the right. Mild right foraminal narrowing. C6-7:  Minimal bulge. C7-T1:  Negative. IMPRESSION: Exam is motion degraded. Mild edema within the right C3 facet raises possibility of bone bruise or subtle fracture (given history) versus degenerative changes. No other findings of osseous or soft tissue edema to suggest acute injury. Minimal to mild degenerative changes most notable C5-6 level as detailed above. Electronically Signed   By: Genia Del  M.D.   On: 10/04/2015 19:18   US Abdomen Complete  10/04/2015  CLINICAL DATA:  Abdominal pain.  Nausea.  Symptoms for 3 days. EXAM: ABDOMEN ULTRASOUND COMPLETE COMPARISON:  CT abdomen and pelvis 08/14/2015 FINDINGS: Gallbladder: Gallbladder is surgically absent. Common bile duct: Diameter: 4 mm, normal Liver: Diffuse coarsening of hepatic parenchymal echotexture with  nodular contour consistent with cirrhosis. No focal lesions identified. Small amount of free fluid around the liver is likely ascites. No Doppler images were obtained of the portal veins. IVC: No abnormality visualized. Pancreas: Not visualized due to overlying bowel gas. Spleen: Size and appearance within normal limits. Right Kidney: Length: 10 cm. Stone in the lower pole measuring 1 cm diameter. No hydronephrosis. Normal parenchymal echotexture. Left Kidney: Length: 11.2 cm. Echogenicity within normal limits. No mass or hydronephrosis visualized. Abdominal aorta: No aneurysm visualized. Other findings: None. IMPRESSION: Surgical absence of the gallbladder. No bile duct dilatation. Changes of hepatic cirrhosis with free fluid consistent with ascites. Nonobstructing right renal stone. Electronically Signed   By: Lucienne Capers M.D.   On: 10/04/2015 20:06   Dg Chest Portable 1 View  10/04/2015  CLINICAL DATA:  Slurred speech, weakness cough on the right side EXAM: PORTABLE CHEST 1 VIEW COMPARISON:  07/31/2015 FINDINGS: The heart size and mediastinal contours are within normal limits. Both lungs are clear. The visualized skeletal structures are unremarkable. IMPRESSION: No active disease. Electronically Signed   By: Kathreen Devoid   On: 10/04/2015 13:36    Review of Systems  Constitutional: Negative for fever.  Respiratory: Positive for shortness of breath. Negative for cough.   Cardiovascular: Positive for chest pain (chest pressure 8/10), leg swelling and PND. Negative for palpitations and orthopnea.  Gastrointestinal: Positive for nausea and abdominal pain (left upper quadrant). Negative for vomiting, blood in stool and melena.  Musculoskeletal: Positive for myalgias (left arm).  Neurological: Positive for dizziness and weakness (left-sided).  All other systems reviewed and are negative.  Blood pressure 112/71, pulse 101, temperature 98.7 F (37.1 C), temperature source Oral, resp. rate 18, height  5' 3"  (1.6 m), weight 138 lb 4.8 oz (62.732 kg), SpO2 98 %. Physical Exam  Nursing note and vitals reviewed. Constitutional: She is oriented to person, place, and time. She appears well-developed and well-nourished. No distress.  HENT:  Head: Normocephalic and atraumatic.  Mouth/Throat: No oropharyngeal exudate.  Eyes: EOM are normal. Pupils are equal, round, and reactive to light. No scleral icterus.  Neck: Normal range of motion. Neck supple. No JVD present.  Respiratory: Effort normal and breath sounds normal. She has no wheezes. She has no rales.  GI: Soft. Bowel sounds are normal. She exhibits no distension. There is tenderness (Left upper quad).  Musculoskeletal: She exhibits no edema.  Lymphadenopathy:    She has no cervical adenopathy.  Neurological: She is alert and oriented to person, place, and time. She exhibits abnormal muscle tone (strength upper and lower ext: right 4/5 left 1-2/5).  Skin: Skin is warm and dry.  Psychiatric: She has a normal mood and affect.    Assessment/Plan: Principal Problem:   Left-sided weakness Active Problems:   DEPRESSION/ANXIETY   Chronic pain syndrome   LUMBAR DISC DISPLACEMENT   Essential hypertension   Chronically on opiate therapy   CVA (cerebral infarction)   Hypokalemia   PAF (paroxysmal atrial fibrillation) (HCC)   Prolonged QT interval   Type 2 diabetes mellitus without complication (HCC)   Chest pain   Liver cirrhosis (HCC)   Hypotension  Expressive aphasia   tachybradycardia syndrome  60 year old female history of bradycardia, diabetes mellitus type 1, hypothyroidism, syncope, anxiety, GERD, TIA, hypertension, paroxysmal atrial fibrillation-On eliquis 15m BID, uterine cancer. She presented yesterday with weakness and speech deficit.  Back in September she had a syncopal episode and severe bradycardia as well. Her amiodarone was decreased to 200 mg and metoprolol was decreased to 25 mg.  Since we have been monitoring her she's  had severe bradycardia down to the 30s. Her QTC this morning was 7014m  potassium was 2.6 with a magnesium of 1.7. Amiodarone and Lopressor were discontinued.  Since I have come up to see her heart rate is changed to sinus tach 100-120.  QTC 48975m I have ordered 2 g of IV magnesium and four runs of IV potassium.  We'll continue to monitor heart rhythm.  She may need EP consult for pacemaker for tachybradycardia syndrome so we may treat the tachycardia.  She is also complaining of 8 out of 10 chest pressure as well.  troponin is negative. She just had a 2-D echocardiogram today which revealed an ejection fraction of 60-65% with normal wall motion. She has moderate LVH, grade 1 diastolic is function.  In addition her TSH is 22.184 with free T4 of 0.62 which is the very low side of normal. Internal medicine is not sure if the patient was taking her Synthroid.    She is being transferred to 2 [address].    HAGTarri FullerACGolden Ridge Surgery Center/25/2017, 2:41 PM   The patient was seen, examined and discussed with BryTarri FullerA-C and I agree with the above.   59 7ar old female history of bradycardia, diabetes mellitus type 1, hypothyroidism, syncope, anxiety, GERD, TIA, hypertension, paroxysmal atrial fibrillation-On chronic eliquis 5mg51mD admitted yesterday with weakness and speech deficit. MRI has no acute changes. Back in September she had a syncopal episode and severe bradycardia as well. Her amiodarone was decreased to 200 mg and metoprolol was decreased to 25 mg. QT/QTc at the time was 548/542 ms,  On this admission the patient was in profound sinus bradycardia with ventricular rate 36 BPM, QT 622, later 764, the patient was found to have hypokalemia and hypomagnesemia that rea being replaced. She is now tachycardic with improved QT/QTc 376/470 ms. Amiodarone and metoprolol were held.  She is also hypothyroid with [facility] [place] the suspicion is that she was not taking her synthroid, we will restart at 75 mcg.  We will consider  consulting EP for a consideration of a PM placement with difficult to control tach-brady syndrome.  NELSDorothy Spark5/2017

## 2015-10-05 NOTE — Progress Notes (Signed)
Subjective: [name] is still confused this morning and is says she does not know where she is or why she is here. She reports sharp pain in her right temporal area and some dizziness. She does not have any family members at bedside.   Objective: Vital signs in last 24 hours: Filed Vitals:   10/05/15 0200 10/05/15 0400 10/05/15 0426 10/05/15 0600  BP: 89/66 100/59  84/48  Pulse: 50 51  54  Temp: 97.7 F (36.5 C) 97.7 F (36.5 C)  98.1 F (36.7 C)  TempSrc: Oral Oral  Oral  Resp: 18 14  16   Height:      Weight:   62.732 kg (138 lb 4.8 oz)   SpO2: 100% 100%  100%   Exam: General: Older woman sitting in chair eating breakfast. Appears somewhat older than stated age, slightly disheveled.  HEENT: PERRLA, EOMI, no scleral icterus, oropharynx clear Cardio: Bradycardic, but regular rhythm. II/VI systolic murmur heard loudest at the right upper sternal border and radiating to the carotids. Radial pulses 2+.  Pulm/Chest: Clear to auscultation without wheezes, crackles or rales. Normal work of breathing.  Abd: Soft, nondistended.  MSK: No lower extremity edema.  Skin: Warm, dry. Hair appears thin and dry.  Neuro: Alert and oriented to self only, visibly confused. Decreased sensation on left half of face, unable to puff cheeks or raise eyebrows. Smile is symmetric. Strength 4/5 in left upper and lower extremity, 5/5 in right upper and lower extremity. Marked stutter but no slurring of speech or true expressive aphasia.   Lab Results: Hepatic panel: total protein 6.2, albumin 3.2, AST 42, ALT 43, alk phos 117. Direct, indirect and total bili all wnl.  TSH 22.184, FT4 0.62. Ammonia: 19 umol/L.  UA cloudy w/ 500 glucose, trace hemoglobin. 6-30 squamous epithelial cells and many bacteria on microscopy.  Tropinin I <0.03 x 3.  Plasma cortisol 2.1 ug/dL.   Studies/Results: X-ray Chest Pa And Lateral  10/05/2015  IMPRESSION: No active cardiopulmonary disease.   Mr Cervical Spine Wo  Contrast  10/04/2015  IMPRESSION: Exam is motion degraded. Mild edema within the right C3 facet raises possibility of bone bruise or subtle fracture (given history) versus degenerative changes. No other findings of osseous or soft tissue edema to suggest acute injury. Minimal to mild degenerative changes most notable C5-6 level as detailed above.   US Abdomen Complete 10/04/2015  IMPRESSION: Surgical absence of the gallbladder. No bile duct dilatation. Changes of hepatic cirrhosis with free fluid consistent with ascites. Nonobstructing right renal stone.   Medications: Scheduled Meds: . apixaban  5 mg Oral BID  . aspirin EC  81 mg Oral Daily  . atorvastatin  40 mg Oral q1800  . dicyclomine  10 mg Oral TID PC & HS  . famotidine  20 mg Oral BID  . insulin aspart  0-9 Units Subcutaneous TID WC  . insulin detemir  10 Units Subcutaneous Daily  . levothyroxine  75 mcg Oral QAC breakfast  . pantoprazole  40 mg Oral BID  . rifaximin  550 mg Oral BID  . senna  1 tablet Oral BID  . sodium chloride flush  3 mL Intravenous Q12H   PRN Meds:.ondansetron, oxyCODONE-acetaminophen   Assessment/Plan: [name] is a 60 year-old woman with medical history including paroxysmal atrial fibrillation, prior TIA, cirrhosis, type 1 diabetes mellitus, uterine cancer, hypertension and hypothyroidism who presented with left-sided weakness, confusion, word-finding difficulties and bradycardia.   1. Confusion and stutter: She remains subjectively confused today,  though her clarity seems to wax and wane in the course of conversation. Her speech is actually less consistent with expressive aphasia and more consistent with a simple stutter. Additionally, her stutter seems inconsistent, with partial resolution when one resident lingered in her room as the team was walking out this morning on rounds. She does not have difficulty with word-finding or naming objects. At this point, hepatic encephalopathy is less likely given her  normal ammonia level, but is still a possibility. Spontaneous bacterial peritonitis is also possible. She does not have any evidence of stroke on neuroimaging, but we will continue stroke work up, as below. Hypothyroidism is still a possibility given her remarkably high TSH and poor adherence to levothyroxine. Finally, as below, we are beginning to wonder if this may be either factitious or conversion disorder. We have restarted the majority of her home medications and will continue to watch her closely. Ultimately, I think she may need a psychiatry consult.  - Continue holding home muscle relaxants and MS Contin  - Consider paracentesis for hepatic encephalopathy or antibiotics for spontaneous bacterial peritonitis if no improvement - Start lactulose, as below - Continue home levothyroxine 75 mcg daily   2. Left-sided hemiparesis and sensory deficit: We reviewed [name]'s CT and MRI with the neuroradiologist this morning and he did not see evidence of any lesion consistent with her constellation of symptoms. He agreed that it is difficult to imagine a cranial nerve lesion that would allow her to smile fully yet not raise her eyebrows, as these movements both involve the lower motor neuron of CN VII. At this point, her symptoms have persisted outside the timeline for TIA and it is highly unlikely that she had a stroke given lack of any changes on neuroimaging. We will continue stroke workup to be thorough, but at this point, are beginning to wonder if her symptoms may be either factitious or part of a conversion disorder.  - Transthoracic echocardiogram - Bilateral carotid ultrasound  - Check HA1c - Check lipid panel  - Start aspirin 81 mg daily for secondary stroke prevention   3. Bradycardia: Heart rate improved slightly overnight with morning rates in the 50's. Her negative troponins overnight suggest that this is not due to an acute ischemic event, though past ischemia is a possible cause. Other  causes include her significant hypothyroidism and questionable medication adherence. Upon chart review, it seems she has had many admissions involving bradycardia and has been followed by cardiology in the past. We will consult cardiology today for recommendations regarding her amiodarone and metoprolol as well as whether a pacemaker might be necessary.  - Consult placed to cardiology, appreciate recommendations - Holding home amiodarone and metoprolol   4. Hypotension: Pressures dropped to 80's/40's early this morning. She was given two fluid boluses without much rise. Cardiology consulted, as above. Given her low cortisol level, we are wondering if adrenal insufficiency may be playing a role in her hypotension. - Hold home lisinopril 5 mg daily  - Check AM cortisol level and ACTH stimulation test  - Start maintenance fluids: 0.9% NS at 68m/hr   5. Hypothyroidism: Overnight labs revealed TSH of 22.184, FT4 of 0.62. This reflects marginally better control when compared to this past November. Again, it is unclear whether she is taking her levothyroxine daily, so we would like to see how she does when consistently taking her 75 mcg dose before increasing it.  - Continue home levothyroxine 75 mcg daily   6. Cirrhosis: Labs reflect  relatively good synthetic function and abdominal ultrasound noted only slight ascites. We will add lactulose to her regimen to help ensure that she is not developing hepatic encephalopathy.  - Continue home rifaximin 550 mg twice daily - Start lactulose 10 mg twice daily  7. Paroxysmal atrial fibrillation: In sinus bradycardia here. CHADSVASc score of 4 (DM, TIA x 2, female).  - Continue home Eliquis 5 mg twice daily   8. Type 1 diabetes mellitus: Blood gluocse of 352 mg/dL on admission, most recent A1c of 9.2% in October 2016. She is on Levemir 17 units nightly at home. New A1c level is pending. Capillary blood glucose levels overnight remained >200, so we will increase  her nighttime insulin tonight.  - Sliding scale insulin, sensitive regimen - Increase Levemir to 15 units nightly   This is a Careers information officer Note.  The care of the patient was discussed with Dr. Melburn Hake and the assessment and plan formulated with their assistance.  Please see their attached note for official documentation of the daily encounter.       Everlene Balls, [place]  10/05/2015, 7:42 AM

## 2015-10-06 ENCOUNTER — Observation Stay (HOSPITAL_BASED_OUTPATIENT_CLINIC_OR_DEPARTMENT_OTHER): Payer: Commercial Managed Care - HMO

## 2015-10-06 ENCOUNTER — Other Ambulatory Visit: Payer: Self-pay

## 2015-10-06 DIAGNOSIS — R001 Bradycardia, unspecified: Secondary | ICD-10-CM | POA: Diagnosis not present

## 2015-10-06 DIAGNOSIS — M6281 Muscle weakness (generalized): Secondary | ICD-10-CM | POA: Diagnosis not present

## 2015-10-06 DIAGNOSIS — R4789 Other speech disturbances: Secondary | ICD-10-CM | POA: Diagnosis not present

## 2015-10-06 DIAGNOSIS — R6889 Other general symptoms and signs: Secondary | ICD-10-CM | POA: Diagnosis not present

## 2015-10-06 DIAGNOSIS — R410 Disorientation, unspecified: Secondary | ICD-10-CM | POA: Diagnosis not present

## 2015-10-06 DIAGNOSIS — R531 Weakness: Secondary | ICD-10-CM | POA: Diagnosis not present

## 2015-10-06 DIAGNOSIS — G459 Transient cerebral ischemic attack, unspecified: Secondary | ICD-10-CM

## 2015-10-06 DIAGNOSIS — H538 Other visual disturbances: Secondary | ICD-10-CM | POA: Diagnosis not present

## 2015-10-06 DIAGNOSIS — R1013 Epigastric pain: Secondary | ICD-10-CM

## 2015-10-06 DIAGNOSIS — Z9114 Patient's other noncompliance with medication regimen: Secondary | ICD-10-CM | POA: Diagnosis not present

## 2015-10-06 DIAGNOSIS — N200 Calculus of kidney: Secondary | ICD-10-CM

## 2015-10-06 DIAGNOSIS — R4781 Slurred speech: Secondary | ICD-10-CM | POA: Diagnosis not present

## 2015-10-06 DIAGNOSIS — E039 Hypothyroidism, unspecified: Secondary | ICD-10-CM | POA: Diagnosis not present

## 2015-10-06 LAB — CBC
HCT: 36.3 % (ref 36.0–46.0)
Hemoglobin: 11.8 g/dL — ABNORMAL LOW (ref 12.0–15.0)
MCH: 28.6 pg (ref 26.0–34.0)
MCHC: 32.5 g/dL (ref 30.0–36.0)
MCV: 87.9 fL (ref 78.0–100.0)
Platelets: 113 10*3/uL — ABNORMAL LOW (ref 150–400)
RBC: 4.13 MIL/uL (ref 3.87–5.11)
RDW: 15.9 % — AB (ref 11.5–15.5)
WBC: 5.5 10*3/uL (ref 4.0–10.5)

## 2015-10-06 LAB — HEMOGLOBIN A1C
HEMOGLOBIN A1C: 7.1 % — AB (ref 4.8–5.6)
MEAN PLASMA GLUCOSE: 157 mg/dL

## 2015-10-06 LAB — GLUCOSE, CAPILLARY
GLUCOSE-CAPILLARY: 215 mg/dL — AB (ref 65–99)
GLUCOSE-CAPILLARY: 172 mg/dL — AB (ref 65–99)
Glucose-Capillary: 155 mg/dL — ABNORMAL HIGH (ref 65–99)
Glucose-Capillary: 184 mg/dL — ABNORMAL HIGH (ref 65–99)

## 2015-10-06 LAB — BASIC METABOLIC PANEL
Anion gap: 11 (ref 5–15)
BUN: 7 mg/dL (ref 6–20)
CALCIUM: 8.7 mg/dL — AB (ref 8.9–10.3)
CHLORIDE: 107 mmol/L (ref 101–111)
CO2: 22 mmol/L (ref 22–32)
CREATININE: 0.82 mg/dL (ref 0.44–1.00)
GFR calc non Af Amer: >60 mL/min (ref >60)
Glucose, Bld: 169 mg/dL — ABNORMAL HIGH (ref 65–99)
Potassium: 4 mmol/L (ref 3.5–5.1)
SODIUM: 140 mmol/L (ref 135–145)

## 2015-10-06 LAB — CORTISOL-AM, BLOOD: CORTISOL - AM: 9.9 ug/dL (ref 6.7–22.6)

## 2015-10-06 LAB — HIV ANTIBODY (ROUTINE TESTING W REFLEX): HIV SCREEN 4TH GENERATION: NONREACTIVE

## 2015-10-06 MED ORDER — OXYCODONE-ACETAMINOPHEN 5-325 MG PO TABS
1.0000 | ORAL_TABLET | Freq: Four times a day (QID) | ORAL | Status: DC | PRN
  Administered 2015-10-06: 2 via ORAL
  Filled 2015-10-06: qty 2

## 2015-10-06 MED ORDER — MORPHINE SULFATE ER 15 MG PO TBCR
30.0000 mg | EXTENDED_RELEASE_TABLET | Freq: Two times a day (BID) | ORAL | Status: DC
  Administered 2015-10-06 – 2015-10-08 (×5): 30 mg via ORAL
  Filled 2015-10-06 (×5): qty 2

## 2015-10-06 MED ORDER — COSYNTROPIN 0.25 MG IJ SOLR
0.2500 mg | Freq: Once | INTRAMUSCULAR | Status: AC
  Administered 2015-10-07: 0.25 mg via INTRAVENOUS
  Filled 2015-10-06 (×2): qty 0.25

## 2015-10-06 NOTE — Progress Notes (Signed)
Tx paged Dr. Melburn Hake - Informed patient c/o abdominal pain; unrelieved w/ Percocet 1 tab 1100. Stated would update/ add Rx to better manage pain.

## 2015-10-06 NOTE — Consult Note (Signed)
   [facility] CM Inpatient Consult   10/06/2015  [name] 1955/11/04 [id number] Patient evaluated for community based chronic disease management services with [facility] [facility] as a benefit of patient's [facility]. Spoke with patient at bedside to explain [place] [facility] services. Patient states she is active with [facility] with physical therapy.  Patient with multiple admissions and ED visits in the past 6 months,  She states she has good support with her husband and her son.  She states she has all of the care she needs at home and feels she does not need rehab at a skilled facility.  Consent form signed.   Patient will receive post hospital discharge call and will be evaluated for monthly home visits for assessments and disease process education and community resource needs. .  Left contact information and [facility] literature at bedside. Made Inpatient Case Manager aware that [place] [facility] following. Of note, [facility] Care Management services does not replace or interfere with any services that are arranged by inpatient case management or social work.  For additional questions or referrals please contact:   Natividad Brood, RN BSN [facility] Liaison  [phone] business mobile phone Toll free office [phone]

## 2015-10-06 NOTE — Progress Notes (Signed)
Patient lying in bed, states pain is at a 10. Pain meds given around an hour ago. BP is elevated, paged on call MD. Will continue to monitor, call light within reach

## 2015-10-06 NOTE — Progress Notes (Signed)
Patient states abdominal pain continues, states w/o relief following MS Contin 30 mg at 1400. Will tx page on call for additional Rx to manage pain. Additionally will request repeat of ACTH Stimulation Study per lab request.

## 2015-10-06 NOTE — Evaluation (Signed)
Physical Therapy Evaluation Patient Details Name: JAILEE JAQUEZ MRN: [id number] DOB: 10/09/1955 Today's Date: 10/06/2015   History of Present Illness  60 year old female presenting with weakness and speech deficits. PMH: bradycardia, diabetes mellitus type 1, hypothyroidism, syncope, anxiety, GERD, TIA, hypertension, paroxysmal atrial fibrillation-On eliquis 39m BID, uterine cancer.   Clinical Impression  Pt admitted with above diagnosis. Pt currently with functional limitations due to the deficits listed below (see PT Problem List). Pt will benefit from skilled PT to increase their independence and safety with mobility to allow discharge to the venue listed below.  At this time the patient's mobility is limited by complaints of abdominal pain and dizziness. During initial session, the patient was able to ambulate 3 steps with moderate assistance. With consideration of the patient's current mobility level, it is anticipated that she would require the use of a wheelchair to enter/exit her home and for household mobility. PT to continue to follow and modify D/C planning as the patient progresses.      Follow Up Recommendations Supervision/Assistance - 24 hour;Home health PT    Equipment Recommendations  Other (comment);Wheelchair;Wheelchair cushion, (patient reports having rw and cane at home. )    Recommendations for Other Services       Precautions / Restrictions Precautions Precautions: Fall Precaution Comments: monitor BP Restrictions Weight Bearing Restrictions: No      Mobility  Bed Mobility Overal bed mobility: Needs Assistance Bed Mobility: Supine to Sit;Sit to Supine     Supine to sit: Min assist;HOB elevated (at hips to pivot to EOB) Sit to supine: Mod assist;HOB elevated (bilateral LEs)   General bed mobility comments: mild dizziness reported while sitting EOB  Transfers Overall transfer level: Needs assistance Equipment used: 1 person hand held assist Transfers:  Sit to/from Stand Sit to Stand: Min assist         General transfer comment: reports feeing fine with standing. Denies dizziness.   Ambulation/Gait Ambulation/Gait assistance: Mod assist Ambulation Distance (Feet): 3 Feet Assistive device: 1 person hand held assist Gait Pattern/deviations: Step-through pattern Gait velocity: very slow   General Gait Details: Patient requiring assistance for balance with ambulation. Reports increasing dizziness when ambulating.   Stairs            Wheelchair Mobility    Modified Rankin (Stroke Patients Only)       Balance Overall balance assessment: Needs assistance Sitting-balance support: No upper extremity supported Sitting balance-Leahy Scale: Fair     Standing balance support: Bilateral upper extremity supported Standing balance-Leahy Scale: Poor Standing balance comment: needing physical assist                             Pertinent Vitals/Pain Pain Assessment: 0-10 Pain Score: 10-Worst pain ever Pain Location: abdomen Pain Descriptors / Indicators:  (hurting) Pain Intervention(s): Limited activity within patient's tolerance;Monitored during session;Patient requesting pain meds-RN notified    Home Living Family/patient expects to be discharged to:: Private residence Living Arrangements: Spouse/significant other Available Help at Discharge: Family;Available 24 hours/day Type of Home: Mobile home Home Access: Ramped entrance     Home Layout: One level Home Equipment: [place]- 2 wheels;Cane - single point      Prior Function Level of Independence: Needs assistance   Gait / Transfers Assistance Needed: HHA by husband from bedroom to chair in living room.   ADL's / Homemaking Assistance Needed: Reports assist provided with meals, bathing, dressing, transfers and bed mobility  Comments: Reports that  she does not use any devices with ambulation, holds onto husband.      Hand Dominance         Extremity/Trunk Assessment               Lower Extremity Assessment: Generalized weakness         Communication   Communication: Expressive difficulties (stuttering)  Cognition Arousal/Alertness: Awake/alert Behavior During Therapy: WFL for tasks assessed/performed Overall Cognitive Status: No family/caregiver present to determine baseline cognitive functioning                      General Comments      Exercises        Assessment/Plan    PT Assessment Patient needs continued PT services  PT Diagnosis Difficulty walking;Generalized weakness;Acute pain   PT Problem List Decreased strength;Decreased activity tolerance;Decreased balance;Decreased mobility  PT Treatment Interventions DME instruction;Gait training;Functional mobility training;Therapeutic activities;Therapeutic exercise;Patient/family education;Balance training   PT Goals (Current goals can be found in the Care Plan section) Acute Rehab PT Goals Patient Stated Goal: to be able to get home PT Goal Formulation: With patient Time For Goal Achievement: 10/20/15 Potential to Achieve Goals: Fair    Frequency Min 3X/week   Barriers to discharge        Co-evaluation               End of Session Equipment Utilized During Treatment: Gait belt Activity Tolerance: Patient limited by pain;Other (comment) (reports dizziness with ambulation) Patient left: in bed;with call bell/phone within reach;Other (comment) (refused sitting in chair due to pain) Nurse Communication: Mobility status    Functional Assessment Tool Used: clinical judgment Functional Limitation: Mobility: Walking and moving around Mobility: Walking and Moving Around Current Status (B3419): At least 60 percent but less than 80 percent impaired, limited or restricted Mobility: Walking and Moving Around Goal Status [phone]): At least 40 percent but less than 60 percent impaired, limited or restricted    Time: 1011-1030 PT Time  Calculation (min) (ACUTE ONLY): 19 min   Charges:   PT Evaluation $PT Eval Moderate Complexity: 1 Procedure     PT G Codes:   PT G-Codes **NOT FOR INPATIENT CLASS** Functional Assessment Tool Used: clinical judgment Functional Limitation: Mobility: Walking and moving around Mobility: Walking and Moving Around Current Status (I0973): At least 60 percent but less than 80 percent impaired, limited or restricted Mobility: Walking and Moving Around Goal Status [phone]): At least 40 percent but less than 60 percent impaired, limited or restricted    Cassell Clement, PT, [place] Pager [phone] Office [phone]  10/06/2015, 10:58 AM

## 2015-10-06 NOTE — Progress Notes (Deleted)
Entered in error

## 2015-10-06 NOTE — Progress Notes (Signed)
VASCULAR LAB PRELIMINARY  PRELIMINARY  PRELIMINARY  PRELIMINARY  Carotid duplex completed.    Preliminary report:  Bilateral:  1-39% ICA stenosis.  Vertebral artery flow is antegrade.     Camey Edell, RVS 10/06/2015, 11:52 AM

## 2015-10-06 NOTE — Progress Notes (Signed)
Patient Name: [name] Date of Encounter: 10/06/2015  Principal Problem:   Left-sided weakness Active Problems:   DEPRESSION/ANXIETY   Chronic pain syndrome   LUMBAR DISC DISPLACEMENT   Essential hypertension   Chronically on opiate therapy   CVA (cerebral infarction)   Hypokalemia   PAF (paroxysmal atrial fibrillation) (HCC)   Prolonged QT interval   Type 2 diabetes mellitus without complication ([place])   Chest pain   Liver cirrhosis ([place])   Hypotension   Expressive aphasia   Speech abnormality   Primary Cardiologist: Dr Meda Coffee, new  Patient Profile: 60 yo female w/ DM, hypothyroid, syncope (2nd bradycardia from meds), anxiety, GERD, TIA, HTN, PAF on Eliquis, uterine CA. Admitted 01/24 w/ weakness, speech deficit. Cards seeing for elevated troponin, prolonged QTc, sinus brady and PAF.  SUBJECTIVE: C/o abdominal pain, no chest pain or SOB  OBJECTIVE Filed Vitals:   10/05/15 1901 10/06/15 0411 10/06/15 0412 10/06/15 0432  BP:  183/80  159/81  Pulse:  98  96  Temp:  99 F (37.2 C)    TempSrc:  Oral    Resp:  18    Height:      Weight:   133 lb (60.328 kg)   SpO2: 99% 98%      Intake/Output Summary (Last 24 hours) at 10/06/15 1301 Last data filed at 10/06/15 0730  Gross per 24 hour  Intake    240 ml  Output    400 ml  Net   -160 ml   Filed Weights   10/04/15 2024 10/05/15 0426 10/06/15 0412  Weight: 128 lb 4.8 oz (58.196 kg) 138 lb 4.8 oz (62.732 kg) 133 lb (60.328 kg)    PHYSICAL EXAM General: Well developed, well nourished, female in no acute distress. Head: Normocephalic, atraumatic.  Neck: Supple without bruits, JVD 8 cm. Lungs:  Resp regular and unlabored, few rales bases. Heart: RRR, S1, S2, no S3, S4, soft murmur; no rub. Abdomen: Soft, non-tender, non-distended, BS + x 4.  Extremities: No clubbing, cyanosis, edema.  Neuro: Alert and oriented X 2. Moves all extremities spontaneously. Speech is a little slurred with  stuttering  LABS: CBC: Recent Labs  10/04/15 1359 10/06/15 0248  WBC 4.1 5.5  NEUTROABS 2.6  --   HGB 11.0* 11.8*  HCT 32.1* 36.3  MCV 83.8 87.9  PLT 110* 113*   INR: Recent Labs  10/04/15 1359  INR 5.80   Basic Metabolic Panel: Recent Labs  10/04/15 2057  10/05/15 1630 10/06/15 0248  NA  --   < > 138 140  K  --   < > 3.7 4.0  CL  --   < > 105 107  CO2  --   < > 21* 22  GLUCOSE  --   < > 144* 169*  BUN  --   < > 7 7  CREATININE  --   < > 0.96 0.82  CALCIUM  --   < > 8.7* 8.7*  MG 1.7  --   --   --   < > = values in this interval not displayed. Liver Function Tests: Recent Labs  10/04/15 2057  AST 42*  ALT 43  ALKPHOS 117  BILITOT 0.7  PROT 6.2*  ALBUMIN 3.2*   Cardiac Enzymes: Recent Labs  10/04/15 2057 10/05/15 0033 10/05/15 0803  TROPONINI <0.03 <0.03 <0.03    Recent Labs  10/04/15 1406  TROPIPOC 0.01   Hemoglobin A1C: Recent Labs  10/05/15 0956  HGBA1C 7.1*  Fasting Lipid Panel: Recent Labs  10/05/15 0956  CHOL 157  HDL 43  LDLCALC 94  TRIG 101  CHOLHDL 3.7   Thyroid Function Tests: Recent Labs  10/04/15 2100  TSH 22.184*   TELE: SR, ST       Radiology/Studies: X-ray Chest Pa And Lateral 10/05/2015  CLINICAL DATA:  Chest pain. EXAM: CHEST  2 VIEW COMPARISON:  10/04/2015 FINDINGS: Shallow inspiration. The heart size and mediastinal contours are within normal limits. Both lungs are clear. The visualized skeletal structures are unremarkable. Surgical clips in the right upper quadrant. IMPRESSION: No active cardiopulmonary disease. Electronically Signed   By: Lucienne Capers M.D.   On: 10/05/2015 00:57   Ct Head Wo Contrast 10/04/2015  CLINICAL DATA:  Dizziness for 4 days, fell 2 days ago, RIGHT parietal headache which worsened in route to hospital, slurred speech, limb ataxia, LEFT facial droop, stroke symptoms, history diabetes mellitus, TIA, hypertension, paroxysmal atrial fibrillation, uterine cancer, stroke EXAM: CT HEAD  WITHOUT CONTRAST TECHNIQUE: Contiguous axial images were obtained from the base of the skull through the vertex without intravenous contrast. COMPARISON:  07/06/2015 FINDINGS: Minimal age-related atrophy. Normal ventricular morphology. No midline shift or mass effect. Otherwise normal appearance of brain parenchyma. No intracranial hemorrhage, mass lesion or evidence acute infarction. No extra-axial fluid collections. Sinuses clear and bones unremarkable. IMPRESSION: No acute intracranial abnormalities. Electronically Signed   By: Lavonia Dana M.D.   On: 10/04/2015 13:52   Mr Brain Wo Contrast 10/04/2015  CLINICAL DATA:  59 year old female with dizziness for 4 days, fall 2 days ago with head injury. Worsening symptoms. Initial encounter. EXAM: MRI HEAD WITHOUT CONTRAST TECHNIQUE: Multiplanar, multiecho pulse sequences of the brain and surrounding structures were obtained without intravenous contrast. COMPARISON:  Head CT without contrast 1339 hours today and earlier. Brain MRI 05/20/2015 and earlier. FINDINGS: Major intracranial vascular flow voids are stable and within normal limits. No restricted diffusion to suggest acute infarction. No midline shift, mass effect, evidence of mass lesion, ventriculomegaly, extra-axial collection or acute intracranial hemorrhage. Cervicomedullary junction and pituitary are within normal limits. Negative visualized cervical spine. Stable gray and white matter signal throughout the brain. Scattered mild for age nonspecific mostly subcortical white matter T2 and FLAIR hyperintensity. No cortical encephalomalacia or chronic cerebral blood products. Visible internal auditory structures appear normal. Trace mastoid fluid is unchanged. Negative nasopharynx. Trace ethmoid sinus mucosal thickening is unchanged. Orbit and scalp soft tissues appear stable and within normal limits. Normal bone marrow signal. IMPRESSION: 1.  No acute intracranial abnormality. 2. Stable noncontrast MRI  appearance of the brain since September. Mild for age nonspecific cerebral white matter signal changes most commonly due to chronic small vessel disease. 3. Stable mild mastoid effusions, most often postinflammatory. Electronically Signed   By: Genevie Ann M.D.   On: 10/04/2015 15:50   Mr Cervical Spine Wo Contrast 10/04/2015  CLINICAL DATA:  60 year old female with onset of dizziness 4 days ago and fall 2 days ago hitting head. Altered speech. Left-sided weakness. Subsequent encounter. EXAM: MRI CERVICAL SPINE WITHOUT CONTRAST TECHNIQUE: Multiplanar, multisequence MR imaging of the cervical spine was performed. No intravenous contrast was administered. COMPARISON:  10/04/2015 brain MR. 05/27/2015 cervical spine CT. FINDINGS: Exam is motion degraded. Cervical medullary junction and visualized intracranial structures unremarkable. No obvious focal cervical cord signal abnormality. Mild edema within the right C3 facet (series 4, images 1 and 2) raises possibility of bone bruise or subtle fracture versus degenerative changes. No other findings of osseous or soft tissue  edema to suggest acute injury. C2-3:  Negative. C3-4: Negative. C4-5:  Minimal bulge. C5-6: Minimal to mild bulge. Slight narrowing ventral thecal sac greater on the right. Mild right foraminal narrowing. C6-7:  Minimal bulge. C7-T1:  Negative. IMPRESSION: Exam is motion degraded. Mild edema within the right C3 facet raises possibility of bone bruise or subtle fracture (given history) versus degenerative changes. No other findings of osseous or soft tissue edema to suggest acute injury. Minimal to mild degenerative changes most notable C5-6 level as detailed above. Electronically Signed   By: Genia Del M.D.   On: 10/04/2015 19:18   US Abdomen Complete 10/04/2015  CLINICAL DATA:  Abdominal pain.  Nausea.  Symptoms for 3 days. EXAM: ABDOMEN ULTRASOUND COMPLETE COMPARISON:  CT abdomen and pelvis 08/14/2015 FINDINGS: Gallbladder: Gallbladder is  surgically absent. Common bile duct: Diameter: 4 mm, normal Liver: Diffuse coarsening of hepatic parenchymal echotexture with nodular contour consistent with cirrhosis. No focal lesions identified. Small amount of free fluid around the liver is likely ascites. No Doppler images were obtained of the portal veins. IVC: No abnormality visualized. Pancreas: Not visualized due to overlying bowel gas. Spleen: Size and appearance within normal limits. Right Kidney: Length: 10 cm. Stone in the lower pole measuring 1 cm diameter. No hydronephrosis. Normal parenchymal echotexture. Left Kidney: Length: 11.2 cm. Echogenicity within normal limits. No mass or hydronephrosis visualized. Abdominal aorta: No aneurysm visualized. Other findings: None. IMPRESSION: Surgical absence of the gallbladder. No bile duct dilatation. Changes of hepatic cirrhosis with free fluid consistent with ascites. Nonobstructing right renal stone. Electronically Signed   By: Lucienne Capers M.D.   On: 10/04/2015 20:06   Dg Chest Portable 1 View 10/04/2015  CLINICAL DATA:  Slurred speech, weakness cough on the right side EXAM: PORTABLE CHEST 1 VIEW COMPARISON:  07/31/2015 FINDINGS: The heart size and mediastinal contours are within normal limits. Both lungs are clear. The visualized skeletal structures are unremarkable. IMPRESSION: No active disease. Electronically Signed   By: Kathreen Devoid   On: 10/04/2015 13:36     Current Medications:  . apixaban  5 mg Oral BID  . aspirin EC  81 mg Oral Daily  . atorvastatin  40 mg Oral q1800  . dicyclomine  10 mg Oral TID PC & HS  . famotidine  20 mg Oral BID  . feeding supplement (GLUCERNA SHAKE)  237 mL Oral TID PC  . insulin aspart  0-9 Units Subcutaneous TID WC  . insulin detemir  15 Units Subcutaneous Daily  . levothyroxine  75 mcg Oral QAC breakfast  . pantoprazole  40 mg Oral BID  . rifaximin  550 mg Oral BID  . senna  1 tablet Oral BID  . sodium chloride flush  3 mL Intravenous Q12H   .  sodium chloride 75 mL/hr at 10/05/15 1735    ASSESSMENT AND PLAN: Principal Problem:   Left-sided weakness - per IM, MRI w/ no acute change - per IM    PAF (paroxysmal atrial fibrillation) (HCC) - follow on telemetry - CHADS2VASC=4, on Eliquis - HR is faster now and BP higher. Pt reported not taking Toprol XL 25 mg on admission, consider starting low-dose Cardizem or Coreg - MD advise on EP eval    Sinus bradycardia - HR 30s on admit - amio and metoprolol held, but pt reported not taking PTA - pt TSH was normal in July 2016 when amio was started  - her TSH was mildly abnormal in September, but went to 34 in November  and Synthroid was started - her LFTs are OK, but perhaps amiodarone should be permanently discontinued.    Prolonged QT interval - QT/QTc 622/481 01/24 w/ HR 36 (K+ 3.5) - QT/QTc 764/704 01/25 at 4 am, HR 51 (K+ 2.5 at 9 am) - QT/QTc 376/470 01/25 at 2:30 pm, HR 94 (K+ 3.7) - QT/QTc  358/477 01/26 (K+ 4.0) - continue to follow but do not expect any abnormalities now that electrolytes have improved.  Otherwise, per IM Active Problems:   DEPRESSION/ANXIETY   Chronic pain syndrome   LUMBAR DISC DISPLACEMENT   Essential hypertension   Chronically on opiate therapy   CVA (cerebral infarction)   Hypokalemia   Type 2 diabetes mellitus without complication (HCC)   Chest pain   Liver cirrhosis (HCC)   Hypotension   Expressive aphasia   Speech abnormality   Signed, Rosaria Ferries , PA-C 1:01 PM 10/06/2015

## 2015-10-06 NOTE — Progress Notes (Signed)
Patient ID: [name], female   DOB: 10-May-1956, 60 y.o.   MRN: [id number]   Subjective: [name] continues to be confused this morning but her stuttering improved when she's doing simple calculations. She's still having subjective left-sided weakness but is able to move her extremities without a problem when distracted. She still complains of chronic abdominal pain, without any worsening.  Objective: Vital signs in last 24 hours: Filed Vitals:   10/05/15 1901 10/06/15 0411 10/06/15 0412 10/06/15 0432  BP:  183/80  159/81  Pulse:  98  96  Temp:  99 F (37.2 C)    TempSrc:  Oral    Resp:  18    Height:      Weight:   60.328 kg (133 lb)   SpO2: 99% 98%     Physical Exam:  General: Frail elderly white lady lying in bed who appears much older than her age Cardiac: Regular rate and rhythm, 2/6 systolic murmur loudest at the right upper sternal border with radiation to the carotid arteries. Pulm: breathing well, clear to auscultation bilaterally Abd: bowel sounds normoactive, soft, nondistended, tender in epigastrium Ext: warm and well perfused, without pedal edema Skin: Diffuse xerosis. Teres nails. Thin hair and thinning of lateral third of eyebrow. Neuro: alert and oriented to self and date only, cranial nerves II-XII grossly intact, left upper and lower extremity strength 4/5, sensation decreased on left side of face, she cannot raise her eyebrows but can smile, 1+ DTRs throughout  Lab Results: Basic Metabolic Panel:  Recent Labs Lab 10/04/15 2057  10/05/15 1630 10/06/15 0248  NA  --   < > 138 140  K  --   < > 3.7 4.0  CL  --   < > 105 107  CO2  --   < > 21* 22  GLUCOSE  --   < > 144* 169*  BUN  --   < > 7 7  CREATININE  --   < > 0.96 0.82  CALCIUM  --   < > 8.7* 8.7*  MG 1.7  --   --   --   < > = values in this interval not displayed.   CBC:  Recent Labs Lab 10/04/15 1359 10/06/15 0248  WBC 4.1 5.5  NEUTROABS 2.6  --   HGB 11.0* 11.8*  HCT 32.1* 36.3  MCV  83.8 87.9  PLT 110* 113*   Medications: I have reviewed the patient's current medications. Scheduled Meds: . apixaban  5 mg Oral BID  . aspirin EC  81 mg Oral Daily  . atorvastatin  40 mg Oral q1800  . dicyclomine  10 mg Oral TID PC & HS  . famotidine  20 mg Oral BID  . feeding supplement (GLUCERNA SHAKE)  237 mL Oral TID PC  . insulin aspart  0-9 Units Subcutaneous TID WC  . insulin detemir  15 Units Subcutaneous Daily  . levothyroxine  75 mcg Oral QAC breakfast  . pantoprazole  40 mg Oral BID  . rifaximin  550 mg Oral BID  . senna  1 tablet Oral BID  . sodium chloride flush  3 mL Intravenous Q12H   Continuous Infusions: . sodium chloride 75 mL/hr at 10/05/15 1735   PRN Meds:.lactulose, ondansetron (ZOFRAN) IV, oxyCODONE-acetaminophen Assessment/Plan:  Suspected tachy-brady syndrome: She has known atrial fibrillation, and her pulse is been documented ranging from 30-150 bpm. Cardiology is following, and is considering an electrophysiology consultation to discuss whether she needs a pacemaker. She was  on amiodarone 200 mg daily and metoprolol XL 25 mg daily before this hospitalization. Were continuing to hold these as she was bradycardic and hypotensive when she originally presented. Her recent pressures and pulse have since normalized. -Thanks for your help cardiology -Holding metoprolol and amiodarone -Daily BMPs to replace potassium and magnesium  Left-sided hemiparesis, stuttering, and confusion: We have ruled out a stroke with a normal CT and MRI, and her neurologic symptoms don't fit one particular territory. Given her odd demeanor, cachectic nature, hypotension, and low morning cortisol, we are considering adrenal insufficiency, and have checked an ACTH stimulation test this morning. Along with her unexplained chronic abdominal pain, acute intermittent porphyria and variagate porphyria popped into the differential as well. She has known hypothyroidism, which may be contributing  as well. I doubt this is hepatic encephalopathy, but we are giving her lactulose and rifaximin as she has known cirrhosis with surprisingly good liver function. Given her cognitive autonomic dysfunction, we considered multiple system atrophy in the differential as well. Paroxysmal nocturnal hemoglobinuria is another thought given her strange abdominal pain, anemia, and odd demeanor, but she doesn't have a thrombosis and is not showing signs of hemolysis. Despite the extensive differential, I think this is most likely psychogenic, possibly conversion disorder, but we will do a thorough workup that had not been done and her numerous prior admissions before calling psychiatry -Ordered random urine porphobilinogen level -Ordered serum porphyrin levels  -Ordered ACTH stimulation test -Continue levothyroxine 75 g daily -Continue lactulose titrated to 3 bowel movements per day along with rifaximin -May need to call psychiatry  Hypothyroidism: Her TSH was 22 this admission and may be related to amiodarone. We will continue her home dose as we're unsure whether she was even taking it. -Continue levothyroxine 47mg daily  Cryptogenic cirrhosis: She has surprisingly good liver function. Per above, I doubt this is hepatic encephalopathy but we will treat to be thorough. I seriously doubt spontaneous bacterial peritonitis. -Continue rifaximin and lactulose  Paroxysmal atrial fibrillation: She is in normal sinus rhythm now. -Continue apixaban 524mtwice daily  Dispo: Disposition is deferred at this time, awaiting improvement of current medical problems.  Anticipated discharge in approximately 2-5 day(s).   The patient does have a current PCP (KPleas KochNP) and [facility].  The patient does have transportation limitations that hinder transportation to clinic appointments.  .Services Needed at time of discharge: Y = Yes, Blank = No PT:   OT:     RN:   Equipment:   Other:       KyLoleta ChanceMD 10/06/2015, 10:26 AM

## 2015-10-06 NOTE — Progress Notes (Signed)
Subjective: No acute events overnight, however, [name] complains this morning of 10/10 abdominal pain which she says has persisted since yesterday evening and prevented her from sleeping at all. Her pain score charted by nursing at 10:30pm last night is 0/10 and I see that she received pain medication at 9:30pm, though she reported not getting any to me this morning. Otherwise, she does not report any complaints this morning. No family members are present at the bedside.   Objective: Vital signs in last 24 hours: Filed Vitals:   10/05/15 1901 10/06/15 0411 10/06/15 0412 10/06/15 0432  BP:  183/80  159/81  Pulse:  98  96  Temp:  99 F (37.2 C)    TempSrc:  Oral    Resp:  18    Height:      Weight:   60.328 kg (133 lb)   SpO2: 99% 98%     Weight change: 1.361 kg (3 lb)  Intake/Output Summary (Last 24 hours) at 10/06/15 1007 Last data filed at 10/06/15 0730  Gross per 24 hour  Intake    240 ml  Output    400 ml  Net   -160 ml   Exam: General: Chronically ill-appearing older woman lying comfortably in bed, in no acute distress. HEENT: PERRLA, EOMI, no scleral icterus, oropharynx clear Cardio: Regular rate and rhythm. II/VI systolic murmur heard loudest at the right upper sternal border and radiating to the carotids.  Pulm/Chest: Clear to auscultation without wheezes, crackles or rales. Normal work of breathing.  Abd: Soft, nondistended, tender in epigastrium and LUQ early this morning but no tenderness on repeat exam during rounds. MSK: No lower extremity edema.  Skin: Warm, dry. Hair appears thin and dry.  Neuro: Alert and oriented to self, less visibly confused than yesterday. Decreased sensation on left half of face, unable to puff cheeks or raise eyebrows. Spontaneous smile is symmetric and full, but weak when prompted. Strength is 5/5 in right upper and lower extremity, 3-4/5 giveaway weakness in left upper and lower extremity. Stutter present for majority of exam, though  she was able to speak one full sentence without any stutter.   Lab Results: Lipid panel wnl.  HA1c 7.1%. BMP notable for K 4.0, Ca 8.7.  CBC notable for Hgb 11.8, Plt 113.  AM Cortisol 9.9.   Studies/Results: Transthoracic echocardiography found moderate LVH, normal systolic function with EF 60-65% and normal wall motion. Doppler parameters were consistent with grade 1 diastolic dysfunction.   Medications: Scheduled Meds: . apixaban  5 mg Oral BID  . aspirin EC  81 mg Oral Daily  . atorvastatin  40 mg Oral q1800  . dicyclomine  10 mg Oral TID PC & HS  . famotidine  20 mg Oral BID  . feeding supplement (GLUCERNA SHAKE)  237 mL Oral TID PC  . insulin aspart  0-9 Units Subcutaneous TID WC  . insulin detemir  15 Units Subcutaneous Daily  . levothyroxine  75 mcg Oral QAC breakfast  . pantoprazole  40 mg Oral BID  . rifaximin  550 mg Oral BID  . senna  1 tablet Oral BID  . sodium chloride flush  3 mL Intravenous Q12H   Continuous Infusions: . sodium chloride 75 mL/hr at 10/05/15 1735   PRN Meds:.lactulose, ondansetron (ZOFRAN) IV, oxyCODONE-acetaminophen  Assessment/Plan: [name] is a 60 year-old woman with medical history including paroxysmal atrial fibrillation, prior TIA, cirrhosis, type 1 diabetes mellitus, uterine cancer, hypertension and hypothyroidism who presented with left-sided weakness,  confusion, word-finding difficulties and bradycardia. Workup for her weakness and confusion has thus-far been non-revealing and she is now being followed by cardiology for labile blood pressure and pulse, with working diagnosis of tachybradycardia.   1. Bradycardia: She developed sinus tachycardia yesterday with rates as high as 120 bpm. She also had a critically low potassium at 2.7 and a QTc of of 778m, which was immediately corrected with 2 g IV magnesium and 40 mEq of IV potassium. This hypokalemia could have been caused by the insulin she has received, her oxycodone or simply the  stress of her autonomic dysfunction. She was evaluated by cardiology yesterday, and they recommended electrophysiology consult for a pacemaker for tachybradycardia syndrome.  - We will consult EP today for possible pacemaker - Holding home amiodarone and metoprolol   2. Hypotension: Pressures increased after fluid boluses yesterday, in the 150's/80's this morning. She does continue to have orthostatic hypotension. Cardiology saw her yesterday and recommended EP consult, as above. Given her low plasma cortisol level yesterday, we have considered whether this may be due to adrenal insufficiency. Her morning cortisol today was within normal limits, suggesting that yesterday's lab may have simply been drawn too late after the physiologic peak of cortisol levels. We will follow up with an ACTH stimulation test.  - Holding home lisinopril 5 mg daily  - ACTH stimulation test pending  - Continue 0.9% NS at 782mhr   3. Confusion, weakness and stutter: [name]'s neurologic exam findings of left-sided weakness, waxing and waning cranial nerve deficits, stutter and confusion are not consistent with stroke. Without any acute changes on CT or MRI, and with normal lipid panel and no evidence of embolus on echo, we have effectively ruled this out as a possibility. This morning, we discussed the possibility that her neurologic symptoms, chronic abdominal pain and pancytopenia may point more towards a porphyria picture. We will check prophobilinogen and prophyrins today. While hepatic encephalopathy is unlikely given lack of ascites on exam and evidence of good hepatic synthetic function, we started her on lactulose yesterday to be safe. We are still entertaining the possibility that her uncontrolled hypothyroidism is contributing to her mental status, and are continuing her home levothyroxine. Finally, we have discussed whether this might be a case of multi-system atrophy, given her autonomic dysfunction, bradykinesia  and weakness. She does not have cogwheel rigidity, tremor or urinary dysfunction also seen in multi-system atrophy and it is more a diagnosis of exclusion, so we will continue other workup before considering this more likely. Finally, my impression is that her symptoms may have a psychiatric etiology, as her weakness, stutter and cranial nerve deficits cannot be explained by a clear neurologic lesion and appear to wax and wane during exam. I think a consult to psychiatry for either factitious or conversion disorders would be warranted once her cardiac condition is more stable.  - Check fractionated porphyrins and random urine porphobilinogen  - Continue home levothyroxine 75 mcg daily  - Continue rifaximin 550 mg twice daily and lactulose 10 mg twice daily  4. Hypothyroidism: As above, this may be contributing to her altered mental status. Though her TSH is considerably elevated at 22.184, with unclear medication adherence, we are continuing her home dose of levothyroxine before considering an increase.  - Continue home levothyroxine 75 mcg daily  5. Paroxysmal atrial fibrillation: In sinus tachycardia now. CHADSVASc score of 4 (DM, TIA x 2, female).  - Continue home Eliquis 5 mg twice daily   6. Type 1  diabetes mellitus: New A1c of 7.1% reflects relatively good control. Her capillary blood glucose levels continue to range 130's-180's. - Sliding scale insulin, sensitive regimen - Increase Levemir to 15 units nightly   This is a Careers information officer Note.  The care of the patient was discussed with Dr. Melburn Hake and the assessment and plan formulated with their assistance.  Please see their attached note for official documentation of the daily encounter.    Everlene Balls, [facility]  10/06/2015, 10:07 AM

## 2015-10-06 NOTE — Care Management Note (Addendum)
Case Management Note  Patient Details  Name: [name] MRN: [id number] Date of Birth: 1956-01-10  Subjective/Objective:     Pt admitted with left sided weakness               Action/Plan:  Pt is from home with husband and adult sons.    CM met with pt post PT eval.  CM was informed that pt has walker however unable to maneuver in some areas of the house, Pt recommended wheelchair however both pt and husband informed CM that the house is not wheelchair accessible.  CM relayed safety concerns about returning home as basically a total care, pt continued to refuse SNF.  CM asked pt how she planned to maneuver around the home at discharge and pt told CM that she would use  Husband and sons as she did before admit.  CM contacted husband and he stated he would do whatever it took when she got home, no plans for SNF.  CM will arrange HH.  Pt has walker and cane at home and husband will provide recommended supervision.  Pt stated she is active with [facility], CM contacted agency.  [facility] following.   Expected Discharge Date:                  Expected Discharge Plan:  Home/Self Care  In-House Referral:  Clinical Social Work  Discharge planning Services  CM Consult  Post Acute Care Choice:    Choice offered to:     DME Arranged:    DME Agency:     HH Arranged:  PT, OT, Nurse's Aide [place] Agency:  [facility]  Status of Service:  In process, will continue to follow  Medicare Important Message Given:    Date Medicare IM Given:    Medicare IM give by:    Date Additional Medicare IM Given:    Additional Medicare Important Message give by:     If discussed at [place] of Stay Meetings, dates discussed:    Additional Comments: CM heard back from [facility], pt has not been active with agency since November 16.  Pt offered choice, pt chose [facility], agency accepted referral.  Insurance does not cover recommended tub bench, pt stated she did not want to pay for equipment.  CM asked via sticky  note for HH order Maryclare Labrador, RN 10/06/2015, 2:40 PM

## 2015-10-06 NOTE — Progress Notes (Signed)
Patient ID: [name], female   DOB: 1956/02/27, 60 y.o.   MRN: [id number] Medicine attending: I personally examined this patient this morning and I concur with the evaluation and management plan as recorded by resident physician Dr. Loleta Chance and medical student Ms. Lannette Donath.  She is now complaining of epigastric abdominal pain. Many of her admissions to [facility] over the last year related to recurrent abdominal pain, nausea, and vomiting. She has had a prior Nissen fundoplication which she believes was done to repair a hiatus hernia. She denies any peptic ulcer disease. She has had extensive GI evaluation and follow-up in [place] which does not need to be repeated at this time. We will treat symptomatically. Continue PPIs. Her neurologic signs and symptoms remain poorly explained with little correlation with imaging studies. She is able to flex and extend her left foot against gravity but not resistance. She has a persistent weak left hand grip. Persistent stuttering speech and inability to raise her eyebrows. We are waiting for neurology follow-up to see if any other things need to be explored. On the rare chance that the combination of bizarre neurologic and GI symptoms may be related to porphyria, we will do a porphyrin screen. ACTH stimulation test done. Full results are pending. Baseline a.m. cortisol is low at 2.1 units on January 25 but not reproducible this morning with value of 9.9 done at 2:48 AM. With respect to her cryptogenic cirrhosis: Hepatitis profile negative in the past. Most recent CT scan of the abdomen done 08/14/2015 showed a diffusely nodular contour of the liver, scattered splenic gastric and esophageal varices, but no splenomegaly. Moderate ascites noted. Incidentally noted was a large, nonobstructing, right renal stone. Distention of the jejunum and proximal ileum. Changes from previous cholecystectomy.

## 2015-10-07 DIAGNOSIS — E876 Hypokalemia: Secondary | ICD-10-CM

## 2015-10-07 DIAGNOSIS — M6281 Muscle weakness (generalized): Secondary | ICD-10-CM | POA: Diagnosis not present

## 2015-10-07 DIAGNOSIS — R6889 Other general symptoms and signs: Secondary | ICD-10-CM | POA: Diagnosis not present

## 2015-10-07 DIAGNOSIS — M6289 Other specified disorders of muscle: Secondary | ICD-10-CM | POA: Diagnosis not present

## 2015-10-07 DIAGNOSIS — Z9114 Patient's other noncompliance with medication regimen: Secondary | ICD-10-CM | POA: Diagnosis not present

## 2015-10-07 DIAGNOSIS — Z8673 Personal history of transient ischemic attack (TIA), and cerebral infarction without residual deficits: Secondary | ICD-10-CM

## 2015-10-07 DIAGNOSIS — E039 Hypothyroidism, unspecified: Secondary | ICD-10-CM | POA: Diagnosis not present

## 2015-10-07 DIAGNOSIS — E032 Hypothyroidism due to medicaments and other exogenous substances: Secondary | ICD-10-CM

## 2015-10-07 DIAGNOSIS — I480 Paroxysmal atrial fibrillation: Secondary | ICD-10-CM | POA: Diagnosis not present

## 2015-10-07 DIAGNOSIS — R001 Bradycardia, unspecified: Secondary | ICD-10-CM | POA: Diagnosis not present

## 2015-10-07 DIAGNOSIS — R4789 Other speech disturbances: Secondary | ICD-10-CM | POA: Diagnosis not present

## 2015-10-07 DIAGNOSIS — R4701 Aphasia: Secondary | ICD-10-CM

## 2015-10-07 DIAGNOSIS — R072 Precordial pain: Secondary | ICD-10-CM | POA: Diagnosis not present

## 2015-10-07 DIAGNOSIS — I10 Essential (primary) hypertension: Secondary | ICD-10-CM | POA: Diagnosis not present

## 2015-10-07 DIAGNOSIS — R410 Disorientation, unspecified: Secondary | ICD-10-CM | POA: Diagnosis not present

## 2015-10-07 DIAGNOSIS — K7030 Alcoholic cirrhosis of liver without ascites: Secondary | ICD-10-CM | POA: Diagnosis not present

## 2015-10-07 LAB — GLUCOSE, CAPILLARY
GLUCOSE-CAPILLARY: 125 mg/dL — AB (ref 65–99)
Glucose-Capillary: 161 mg/dL — ABNORMAL HIGH (ref 65–99)
Glucose-Capillary: 208 mg/dL — ABNORMAL HIGH (ref 65–99)
Glucose-Capillary: 290 mg/dL — ABNORMAL HIGH (ref 65–99)

## 2015-10-07 LAB — ACTH STIMULATION, 3 TIME POINTS
CORTISOL 60 MIN: 26.1 ug/dL
Cortisol, 30 Min: 19.7 ug/dL
Cortisol, Base: 3.6 ug/dL

## 2015-10-07 MED ORDER — OXYCODONE HCL 5 MG PO TABS
5.0000 mg | ORAL_TABLET | Freq: Four times a day (QID) | ORAL | Status: DC | PRN
  Administered 2015-10-07 – 2015-10-08 (×2): 10 mg via ORAL
  Filled 2015-10-07 (×2): qty 2

## 2015-10-07 MED ORDER — METOPROLOL TARTRATE 25 MG PO TABS
25.0000 mg | ORAL_TABLET | Freq: Two times a day (BID) | ORAL | Status: DC
  Administered 2015-10-07 – 2015-10-08 (×3): 25 mg via ORAL
  Filled 2015-10-07 (×3): qty 1

## 2015-10-07 NOTE — Progress Notes (Signed)
Patient ID: [name], female   DOB: 1956/08/26, 60 y.o.   MRN: [id number] Medicine attending: I examined this patient this morning together with resident physician Dr. Loleta Chance and I concur with his evaluation and management plan which we discussed together. #1. History of paroxysmal atrial fibrillation. She appears to have Tachycardia-bradycardia syndrome with dramatic hypotension when she becomes bradycardic. We will pursue a electrophysiology study.  I anticipate that she will require a pacemaker. She's had multiple symptomatic hypotensive and bradycardia episodes over the last 12 months resulting in multiple hospitalizations. #2. Neurologic signs and symptoms disproportionate to imaging findings. Still with stuttering speech but improving. Left hand strength improving. Left foot strength improving. Now 5 over 5 in extension and 4 over 5 in flexion. She remains on full dose anticoagulation. We added aspirin since this did not appear to be an embolic event. #3. Chronic, relapsing, abdominal pain Symptoms have subsided with conservative treatment. #4. Hypothyroidism with suspected medical noncompliance. Now back on Synthroid. This may be somewhat difficult to titrate when she has a episode of tachycardia. #5. Cryptogenic cirrhosis #6. Type 2 diabetes. Receiving long-acting insulin while she is hospitalized. #7. Mineral and electrolyte disturbances (hypokalemia, hypomagnesemia,) now corrected with replacement.

## 2015-10-07 NOTE — Progress Notes (Signed)
Called phlebotomy to make sure draw would be done prior to 05:30 admin of cosyntropin. Was told that they don't have the personnel to do the 30 minute draw and that it would be need to be rescheduled after 7. Will advise morning shift. Moved to 07:30.

## 2015-10-07 NOTE — Progress Notes (Signed)
Occupational Therapy Treatment Patient Details Name: [name] MRN: [id number] DOB: [birth year] Today's Date: 10/07/2015    History of present illness 60 year old female presenting with weakness and speech deficits. PMH: bradycardia, diabetes mellitus type 1, hypothyroidism, syncope, anxiety, GERD, TIA, hypertension, paroxysmal atrial fibrillation-On eliquis 12m BID, uterine cancer.    OT comments  Pt cooperative and eager to get OOB to drink her coffee and read the newspaper.  Requiring minimum assistance for standing grooming, toileting and to ambulate in room. Pt using compensatory strategies for tremor with eating.  Pt recalling education from previous OT session and equipment recommendations.  Follow Up Recommendations  Home health OT    Equipment Recommendations  3 in 1 bedside comode;Tub/shower bench (husband may want to find tub bench on his own)    Recommendations for Other Services      Precautions / Restrictions Precautions Precautions: Fall Precaution Comments: monitor BP Restrictions Weight Bearing Restrictions: No       Mobility Bed Mobility   Bed Mobility: Supine to Sit     Supine to sit: Supervision     General bed mobility comments: sat EOB and took deep breaths, did not report dizziness or SOB  Transfers   Equipment used: 1 person hand held assist   Sit to Stand: Min assist              Balance                                   ADL Overall ADL's : Needs assistance/impaired Eating/Feeding: Independent;Sitting Eating/Feeding Details (indicate cue type and reason): opened packages, used 2 hands on coffee to minimize tremor Grooming: Wash/dry hands;Min guard;Standing               Lower Body Dressing: Minimal assistance   Toilet Transfer: Minimal assistance;Ambulation;BSC (hand held assist)   Toileting- Clothing Manipulation and Hygiene: Minimal assistance;Sit to/from stand       Functional mobility during ADLs:  Minimal assistance (hand held)        Vision                     Perception     Praxis      Cognition   Behavior During Therapy: [facility]/performed Overall Cognitive Status: No family/caregiver present to determine baseline cognitive functioning (WFL for activities, recalled education from previous OT sess)                       Extremity/Trunk Assessment               Exercises     Shoulder Instructions       General Comments      Pertinent Vitals/ Pain       Pain Assessment: Faces Faces Pain Scale: Hurts little more Pain Location: head, chest Pain Descriptors / Indicators: Aching Pain Intervention(s): Monitored during session;Repositioned;Limited activity within patient's tolerance  Home Living                                          Prior Functioning/Environment              Frequency Min 2X/week     Progress Toward Goals  OT Goals(current goals can now be found in the care plan section)  Progress towards OT goals: Progressing toward goals  Acute Rehab OT Goals Patient Stated Goal: to be able to get home  Plan Discharge plan remains appropriate    Co-evaluation                 End of Session Equipment Utilized During Treatment: Gait belt   Activity Tolerance Patient tolerated treatment well   Patient Left in chair;with call bell/phone within reach (phlebotomist in room)   Nurse Communication          Time: [phone] OT Time Calculation (min): 24 min  Charges: OT General Charges $OT Visit: 1 Procedure OT Treatments $Self Care/Home Management : 23-37 mins  Malka So 10/07/2015, 9:29 AM  [phone]

## 2015-10-07 NOTE — Progress Notes (Signed)
Patient sitting up in chair. Much more alert than previous night, no slurring and was able to walk with Tech through hallway. Call light within reach.

## 2015-10-07 NOTE — Progress Notes (Signed)
Primary team asked for EP consult. Discussed with Dr Meda Coffee who has been following for cardiology.  Patient still with medication adjustments being made for tachy/brady.  TSH elevated.  Dr Lovena Le is here this weekend and will consult formally if still issues with rate over the weekend.   Chanetta Marshall, NP 10/07/2015 12:06 PM

## 2015-10-07 NOTE — Progress Notes (Addendum)
Patient Name: [name] Date of Encounter: 10/07/2015     Principal Problem:   Left-sided weakness Active Problems:   DEPRESSION/ANXIETY   Chronic pain syndrome   LUMBAR DISC DISPLACEMENT   Essential hypertension   Chronically on opiate therapy   CVA (cerebral infarction)   Hypokalemia   PAF (paroxysmal atrial fibrillation) (HCC)   Prolonged QT interval   Type 2 diabetes mellitus without complication (HCC)   Chest pain   Liver cirrhosis (HCC)   Hypotension   Expressive aphasia   Speech abnormality   PATIENT PROFILE: 60 yo female w/ DM, hypothyroid, syncope (2nd bradycardia from meds), anxiety, GERD, TIA, HTN, PAF on Eliquis, uterine CA. Admitted 01/24 w/ weakness, speech deficit. Cards seeing for elevated troponin, prolonged QTc, sinus brady and PAF.  SUBJECTIVE Patient reports that she is feeling better. Does admit to bouts of palpitations yesterday that were associated with chest pain ranked 10/10, SOB, nausea, and lightheadedness. Today, states she has minimal chest discomfort, rates it 4/10. States that she feels her heart racing more when she is moving around. Denies SOB, orthopnea, edema, and dizziness.    CURRENT MEDS . apixaban  5 mg Oral BID  . aspirin EC  81 mg Oral Daily  . atorvastatin  40 mg Oral q1800  . dicyclomine  10 mg Oral TID PC & HS  . famotidine  20 mg Oral BID  . feeding supplement (GLUCERNA SHAKE)  237 mL Oral TID PC  . insulin aspart  0-9 Units Subcutaneous TID WC  . insulin detemir  15 Units Subcutaneous Daily  . levothyroxine  75 mcg Oral QAC breakfast  . morphine  30 mg Oral BID  . pantoprazole  40 mg Oral BID  . rifaximin  550 mg Oral BID  . senna  1 tablet Oral BID  . sodium chloride flush  3 mL Intravenous Q12H    OBJECTIVE  Filed Vitals:   10/06/15 0432 10/06/15 1500 10/06/15 2046 10/07/15 0424  BP: 159/81  193/99 180/93  Pulse: 96  104 123  Temp:   98.9 F (37.2 C) 99.5 F (37.5 C)  TempSrc:   Oral Oral  Resp:   16 18   Height:      Weight:    133 lb 11.2 oz (60.646 kg)  SpO2:  99% 99%     Intake/Output Summary (Last 24 hours) at 10/07/15 0916 Last data filed at 10/07/15 0911  Gross per 24 hour  Intake    480 ml  Output    400 ml  Net     80 ml   Filed Weights   10/05/15 0426 10/06/15 0412 10/07/15 0424  Weight: 138 lb 4.8 oz (62.732 kg) 133 lb (60.328 kg) 133 lb 11.2 oz (60.646 kg)    PHYSICAL EXAM  General: Pleasant, NAD. Neuro: Alert and oriented X 2. Speech is slurred and stuttering present.  Moves all extremities spontaneously.  Strength is decreased on left upper and lower extremity at 3-4/5. Right upper and lower strength 5/5. Was able to ambulate from bed to the chair with minimal assistance from occupational therapist.  HEENT:  Normal  Neck: Supple without bruits or JVD 8 cm. Lungs:  Resp regular and unlabored, CTAB. Heart: increased rate, regular rhythm, no s3, s4, or murmurs. Abdomen: Soft, non-tender, non-distended, BS + x 4.  Extremities: No clubbing, cyanosis or edema. DP/PT/Radials 2+ and equal bilaterally.  Accessory Clinical Findings  CBC  Recent Labs  10/04/15 1359 10/06/15 0248  WBC 4.1 5.5  NEUTROABS 2.6  --   HGB 11.0* 11.8*  HCT 32.1* 36.3  MCV 83.8 87.9  PLT 110* 627*   Basic Metabolic Panel  Recent Labs  10/04/15 2057  10/05/15 1630 10/06/15 0248  NA  --   < > 138 140  K  --   < > 3.7 4.0  CL  --   < > 105 107  CO2  --   < > 21* 22  GLUCOSE  --   < > 144* 169*  BUN  --   < > 7 7  CREATININE  --   < > 0.96 0.82  CALCIUM  --   < > 8.7* 8.7*  MG 1.7  --   --   --   < > = values in this interval not displayed. Liver Function Tests  Recent Labs  10/04/15 2057  AST 42*  ALT 43  ALKPHOS 117  BILITOT 0.7  PROT 6.2*  ALBUMIN 3.2*   No results for input(s): LIPASE, AMYLASE in the last 72 hours. Cardiac Enzymes  Recent Labs  10/04/15 2057 10/05/15 0033 10/05/15 0803  TROPONINI <0.03 <0.03 <0.03   BNP Invalid input(s):  POCBNP D-Dimer No results for input(s): DDIMER in the last 72 hours. Hemoglobin A1C  Recent Labs  10/05/15 0956  HGBA1C 7.1*   Fasting Lipid Panel  Recent Labs  10/05/15 0956  CHOL 157  HDL 43  LDLCALC 94  TRIG 101  CHOLHDL 3.7   Thyroid Function Tests  Recent Labs  10/04/15 2100  TSH 22.184*    TELE Tele shows sinus tachycardia, HR consistently in 120s. No VT  Radiology/Studies  X-ray Chest Pa And Lateral  10/05/2015  CLINICAL DATA:  Chest pain. EXAM: CHEST  2 VIEW COMPARISON:  10/04/2015 FINDINGS: Shallow inspiration. The heart size and mediastinal contours are within normal limits. Both lungs are clear. The visualized skeletal structures are unremarkable. Surgical clips in the right upper quadrant. IMPRESSION: No active cardiopulmonary disease. Electronically Signed   By: Lucienne Capers M.D.   On: 10/05/2015 00:57   Ct Head Wo Contrast  10/04/2015  CLINICAL DATA:  Dizziness for 4 days, fell 2 days ago, RIGHT parietal headache which worsened in route to hospital, slurred speech, limb ataxia, LEFT facial droop, stroke symptoms, history diabetes mellitus, TIA, hypertension, paroxysmal atrial fibrillation, uterine cancer, stroke EXAM: CT HEAD WITHOUT CONTRAST TECHNIQUE: Contiguous axial images were obtained from the base of the skull through the vertex without intravenous contrast. COMPARISON:  07/06/2015 FINDINGS: Minimal age-related atrophy. Normal ventricular morphology. No midline shift or mass effect. Otherwise normal appearance of brain parenchyma. No intracranial hemorrhage, mass lesion or evidence acute infarction. No extra-axial fluid collections. Sinuses clear and bones unremarkable. IMPRESSION: No acute intracranial abnormalities. Electronically Signed   By: Lavonia Dana M.D.   On: 10/04/2015 13:52   Mr Brain Wo Contrast  10/04/2015  CLINICAL DATA:  60 year old female with dizziness for 4 days, fall 2 days ago with head injury. Worsening symptoms. Initial encounter.  EXAM: MRI HEAD WITHOUT CONTRAST TECHNIQUE: Multiplanar, multiecho pulse sequences of the brain and surrounding structures were obtained without intravenous contrast. COMPARISON:  Head CT without contrast 1339 hours today and earlier. Brain MRI 05/20/2015 and earlier. FINDINGS: Major intracranial vascular flow voids are stable and within normal limits. No restricted diffusion to suggest acute infarction. No midline shift, mass effect, evidence of mass lesion, ventriculomegaly, extra-axial collection or acute intracranial hemorrhage. Cervicomedullary junction and pituitary are within normal limits. Negative visualized cervical spine. Stable gray  and white matter signal throughout the brain. Scattered mild for age nonspecific mostly subcortical white matter T2 and FLAIR hyperintensity. No cortical encephalomalacia or chronic cerebral blood products. Visible internal auditory structures appear normal. Trace mastoid fluid is unchanged. Negative nasopharynx. Trace ethmoid sinus mucosal thickening is unchanged. Orbit and scalp soft tissues appear stable and within normal limits. Normal bone marrow signal. IMPRESSION: 1.  No acute intracranial abnormality. 2. Stable noncontrast MRI appearance of the brain since September. Mild for age nonspecific cerebral white matter signal changes most commonly due to chronic small vessel disease. 3. Stable mild mastoid effusions, most often postinflammatory. Electronically Signed   By: Genevie Ann M.D.   On: 10/04/2015 15:50   Mr Cervical Spine Wo Contrast  10/04/2015  CLINICAL DATA:  60 year old female with onset of dizziness 4 days ago and fall 2 days ago hitting head. Altered speech. Left-sided weakness. Subsequent encounter. EXAM: MRI CERVICAL SPINE WITHOUT CONTRAST TECHNIQUE: Multiplanar, multisequence MR imaging of the cervical spine was performed. No intravenous contrast was administered. COMPARISON:  10/04/2015 brain MR. 05/27/2015 cervical spine CT. FINDINGS: Exam is motion  degraded. Cervical medullary junction and visualized intracranial structures unremarkable. No obvious focal cervical cord signal abnormality. Mild edema within the right C3 facet (series 4, images 1 and 2) raises possibility of bone bruise or subtle fracture versus degenerative changes. No other findings of osseous or soft tissue edema to suggest acute injury. C2-3:  Negative. C3-4: Negative. C4-5:  Minimal bulge. C5-6: Minimal to mild bulge. Slight narrowing ventral thecal sac greater on the right. Mild right foraminal narrowing. C6-7:  Minimal bulge. C7-T1:  Negative. IMPRESSION: Exam is motion degraded. Mild edema within the right C3 facet raises possibility of bone bruise or subtle fracture (given history) versus degenerative changes. No other findings of osseous or soft tissue edema to suggest acute injury. Minimal to mild degenerative changes most notable C5-6 level as detailed above. Electronically Signed   By: Genia Del M.D.   On: 10/04/2015 19:18   US Abdomen Complete  10/04/2015  CLINICAL DATA:  Abdominal pain.  Nausea.  Symptoms for 3 days. EXAM: ABDOMEN ULTRASOUND COMPLETE COMPARISON:  CT abdomen and pelvis 08/14/2015 FINDINGS: Gallbladder: Gallbladder is surgically absent. Common bile duct: Diameter: 4 mm, normal Liver: Diffuse coarsening of hepatic parenchymal echotexture with nodular contour consistent with cirrhosis. No focal lesions identified. Small amount of free fluid around the liver is likely ascites. No Doppler images were obtained of the portal veins. IVC: No abnormality visualized. Pancreas: Not visualized due to overlying bowel gas. Spleen: Size and appearance within normal limits. Right Kidney: Length: 10 cm. Stone in the lower pole measuring 1 cm diameter. No hydronephrosis. Normal parenchymal echotexture. Left Kidney: Length: 11.2 cm. Echogenicity within normal limits. No mass or hydronephrosis visualized. Abdominal aorta: No aneurysm visualized. Other findings: None. IMPRESSION:  Surgical absence of the gallbladder. No bile duct dilatation. Changes of hepatic cirrhosis with free fluid consistent with ascites. Nonobstructing right renal stone. Electronically Signed   By: Lucienne Capers M.D.   On: 10/04/2015 20:06   Dg Chest Portable 1 View  10/04/2015  CLINICAL DATA:  Slurred speech, weakness cough on the right side EXAM: PORTABLE CHEST 1 VIEW COMPARISON:  07/31/2015 FINDINGS: The heart size and mediastinal contours are within normal limits. Both lungs are clear. The visualized skeletal structures are unremarkable. IMPRESSION: No active disease. Electronically Signed   By: Kathreen Devoid   On: 10/04/2015 13:36    ASSESSMENT AND PLAN Principal Problem: Left-sided weakness: - per  IM, MRI w/ no acute change - patient was ambulating with minimal help from occupational therapist this AM  Sinus Tachycardia:  - on admission, HR was in 30s, amiodarone and metoprolol were held - HR has consistently been in 120s since yesterday - BP elevated, last AM reading of 180/93 - MD advice on adding beta blocker for rate and BP control vs EP eval??  Paroxysmal Atrial Fibrillation: - Continue monitoring on telemetry - Patient is currently in sinus tachycardia with rates in 120s - CHADS2VASC=4, on Eliquis  Prolonged QT interval: -Resolved  -QT/QTc 358/477 01/26 (K+ 4.0)  Signed, Tenna Delaine PA-Student  Agree, with changes made.  Lenoard Aden 10/07/2015 10:13 AM Beeper [phone]  The patient was seen, examined and discussed with Rosaria Ferries, PA-C and I agree with the above.   60 year old female history of bradycardia, diabetes mellitus type 1, hypothyroidism, syncope, anxiety, GERD, TIA, hypertension, paroxysmal atrial fibrillation-On chronic eliquis 77m BID admitted yesterday with weakness and speech deficit. MRI has no acute changes. Back in September she had a syncopal episode and severe bradycardia as well. Her amiodarone was decreased to 200 mg and  metoprolol was decreased to 25 mg. QT/QTc at the time was 548/542 ms,  On this admission the patient was in profound sinus bradycardia with ventricular rate 36 BPM, QT 622, later 764, the patient was found to have hypokalemia and hypomagnesemia that rea being replaced. She is now tachycardic with improved QT/QTc 376/470 ms. Amiodarone and metoprolol were held.  Now Qt/QTc normal, we will restart metoprolol and follow. She is also hypothyroid with [id number], the suspicion is that she was not taking her synthroid, we will restart at 75 mcg.  We will consider consulting EP for a consideration of a PM placement with difficult to control tach-brady syndrome.  NDorothy Spark1/27/2017

## 2015-10-07 NOTE — Progress Notes (Signed)
Patient ID: [name], female   DOB: [birth year], 60 y.o.   MRN: [id number]   Subjective: [name] feels her left sided weakness is resolving and her speech is improving. Her abdominal pain continues to bother her but is no worse than her chronic level of pain. She has no other complaints today.  Objective: Vital signs in last 24 hours: Filed Vitals:   10/06/15 0432 10/06/15 1500 10/06/15 2046 10/07/15 0424  BP: 159/81  193/99 180/93  Pulse: 96  104 123  Temp:   98.9 F (37.2 C) 99.5 F (37.5 C)  TempSrc:   Oral Oral  Resp:   16 18  Height:      Weight:    60.646 kg (133 lb 11.2 oz)  SpO2:  99% 99%    Physical Exam:  General: Frail elderly white lady lying in bed who appears much older than her age Cardiac: Regular rate and rhythm, 2/6 systolic murmur loudest at the right upper sternal border with radiation to the carotid arteries. Pulm: breathing well, clear to auscultation bilaterally Abd: bowel sounds normoactive, soft, nondistended, tender in epigastrium Ext: warm and well perfused, without pedal edema Skin: Diffuse xerosis. Teres nails. Thin hair and thinning of lateral third of eyebrow. Neuro: alert and oriented to self and date only, cranial nerves II-XII grossly intact, left upper and lower extremity strength greatly improved compared to admission, sensation decreased on left side of face, she cannot raise her eyebrows but can smile, 1+ DTRs throughout  Medications: I have reviewed the patient's current medications. Scheduled Meds: . apixaban  5 mg Oral BID  . aspirin EC  81 mg Oral Daily  . atorvastatin  40 mg Oral q1800  . dicyclomine  10 mg Oral TID PC & HS  . famotidine  20 mg Oral BID  . feeding supplement (GLUCERNA SHAKE)  237 mL Oral TID PC  . insulin aspart  0-9 Units Subcutaneous TID WC  . insulin detemir  15 Units Subcutaneous Daily  . levothyroxine  75 mcg Oral QAC breakfast  . morphine  30 mg Oral BID  . pantoprazole  40 mg Oral BID  . rifaximin  550  mg Oral BID  . senna  1 tablet Oral BID  . sodium chloride flush  3 mL Intravenous Q12H   Continuous Infusions: . sodium chloride 75 mL/hr at 10/06/15 2232   PRN Meds:.lactulose, ondansetron (ZOFRAN) IV, oxyCODONE   Assessment/Plan:  Suspected tachy-brady syndrome: She has known atrial fibrillation, and her pulse is been documented ranging from 30-150 bpm since admission. She was on amiodarone 200 mg daily and metoprolol XL 25 mg daily before this hospitalization. Were continuing to hold these as she was bradycardic and hypotensive when she originally presented. Her recent pressures and pulse have since normalized. I'll touch base with elecrophysiology again today to see if they can evaluate her for a pacemaker. 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 We will continue her home dose but I suspect we'll stop the amiodarone and her hypothyroidism will likely resolve. I'll send her home on the levothyroxine but have her TSH checked in 3 weeks to see if she still neds it. -Continue levothyroxine 65mg daily -Will need outpatient TSH in 3 weeks  Left-sided hemiparesis, stuttering, and confusion: We have ruled out a stroke with a normal CT and MRI, and her neurologic symptoms  don't fit one particular territory. Given her odd demeanor, cachectic nature, hypotension, and low morning cortisol, we are considering adrenal insufficiency, and have checked an ACTH stimulation test this morning. Along with her unexplained chronic abdominal pain, acute intermittent porphyria and variagate porphyria popped into the differential as well. She has known hypothyroidism, which may be contributing as well. I doubt this is hepatic encephalopathy, but we are giving her lactulose and rifaximin as she has known  cirrhosis with surprisingly good liver function. Paroxysmal nocturnal hemoglobinuria is another thought given her strange abdominal pain, anemia, and odd demeanor, but she doesn't have a thrombosis and is not showing signs of hemolysis. Despite the extensive differential, I think this is most likely psychogenic, possibly conversion disorder, but we will do a thorough workup that had not been done during her numerous prior admissions. -Ordered random urine porphobilinogen level -Ordered serum porphyrin levels  -Ordered ACTH stimulation test -Continue levothyroxine 75 g daily for now -Continue lactulose titrated to 3 bowel movements per day along with rifaximin  Cryptogenic cirrhosis: She has surprisingly good liver function. Per above, I doubt this is hepatic encephalopathy but we will treat to be thorough. I seriously doubt spontaneous bacterial peritonitis. -Continue rifaximin and lactulose  Paroxysmal atrial fibrillation: She is in normal sinus rhythm now. -Continue apixaban 19m twice daily  Dispo: Disposition is deferred at this time, awaiting improvement of current medical problems.  Anticipated discharge in approximately 1-3 day(s).   The patient does have a current PCP ([name] NP) [facility].  The patient does not have transportation limitations that hinder transportation to clinic appointments.  .Services Needed at time of discharge: Y = Yes, Blank = No PT:   OT:   RN:   Equipment:   Other:       KLoleta Chance MD 10/07/2015, 11:43 AM

## 2015-10-07 NOTE — Progress Notes (Signed)
Speech Language Pathology Treatment: Cognitive-Linquistic  Patient Details Name: [name] MRN: [id number] DOB: 17-Oct-1955 Today's Date: 10/07/2015 Time: 1340-1400 SLP Time Calculation (min) (ACUTE ONLY): 20 min  Assessment / Plan / Recommendation Clinical Impression  Improvement noted since last visit with SLP. Pt continues disoriented to place, indicating she is in a hotel in [place], [place]. Pt was more accurate for DOB today, stating 12/29/1950. Pt was able to follow directions well and participate in conversation, and it was noted that pt's dysfluency decreased significantly as the session progressed. Family was not present to provide information on baseline level of cognitive function, however, based on current presentation, 24 hour supervision is recommended after DC.   HPI HPI: 60 year old female admitted 0/24/07 with left sided weakness, confusion and word finding difficulty. PMH significant for paroxysmal A Fib, cryptogenic cirrhosis, DM, uterine CA, HTN, hypothyroid, GERD. Pt fell 2 days prior to admit striking her head. MRI and CT were negative for acute findings.      SLP Plan  Continue with current plan of care     Recommendations   24 hour supervision            Follow up Recommendations: 24 hour supervision/assistance Plan: Continue with current plan of care     Functional Assessment Tool Used: asha noms, clinical judgment Functional Limitations: Memory Memory Current Status (M5465): At least 40 percent but less than 60 percent impaired, limited or restricted Memory Goal Status (K3546): At least 40 percent but less than 60 percent impaired, limited or restricted    Shonna Chock 10/07/2015, 3:00 PM  [facility], [place] [phone]

## 2015-10-07 NOTE — Care Management Note (Addendum)
Case Management Note  Patient Details  Name: [name] MRN: [id number] Date of Birth: 1956/03/12  Subjective/Objective:     Pt admitted with left sided weakness               Action/Plan:  Pt is from home with husband and adult sons.    CM met with pt post PT eval.  CM was informed that pt has walker however unable to maneuver in some areas of the house, Pt recommended wheelchair however both pt and husband informed CM that the house is not wheelchair accessible.  CM relayed safety concerns about returning home as basically a total care, pt continued to refuse SNF.  CM asked pt how she planned to maneuver around the home at discharge and pt told CM that she would use  Husband and sons as she did before admit.  CM contacted husband and he stated he would do whatever it took when she got home, no plans for SNF.  CM will arrange HH.  Pt has walker and cane at home and husband will provide recommended supervision.  Pt stated she is active with [facility], CM contacted agency.  [facility] following.   Expected Discharge Date:                  Expected Discharge Plan:  Home/Self Care  In-House Referral:  Clinical Social Work  Discharge planning Services  CM Consult  Post Acute Care Choice:    Choice offered to:     DME Arranged:    DME Agency:     HH Arranged:  PT, OT, Nurse's Aide [place] Agency:  [facility]  Status of Service:  In process, will continue to follow  Medicare Important Message Given:    Date Medicare IM Given:    Medicare IM give by:    Date Additional Medicare IM Given:    Additional Medicare Important Message give by:     If discussed at [place] of Stay Meetings, dates discussed:    Additional Comments: 10/07/2015  HH orders have been written, Jackquline Denmark is liaison following pt for discharge to resume [place].  CM contacted attending to request [facility] order.  CM will continue to monitor for disposition needs  CM heard back from [facility], pt has not been active with agency  since November 16.  Pt offered choice, pt chose [facility], agency accepted referral.  Insurance does not cover recommended tub bench, pt stated she did not want to pay for equipment.  CM asked via sticky note for HH order Maryclare Labrador, RN 10/07/2015, 10:57 AM

## 2015-10-08 DIAGNOSIS — R072 Precordial pain: Secondary | ICD-10-CM | POA: Diagnosis not present

## 2015-10-08 DIAGNOSIS — K7030 Alcoholic cirrhosis of liver without ascites: Secondary | ICD-10-CM

## 2015-10-08 DIAGNOSIS — R4789 Other speech disturbances: Secondary | ICD-10-CM | POA: Diagnosis not present

## 2015-10-08 DIAGNOSIS — M6281 Muscle weakness (generalized): Secondary | ICD-10-CM | POA: Diagnosis not present

## 2015-10-08 DIAGNOSIS — E039 Hypothyroidism, unspecified: Secondary | ICD-10-CM | POA: Diagnosis not present

## 2015-10-08 DIAGNOSIS — E032 Hypothyroidism due to medicaments and other exogenous substances: Secondary | ICD-10-CM | POA: Diagnosis not present

## 2015-10-08 DIAGNOSIS — R001 Bradycardia, unspecified: Secondary | ICD-10-CM | POA: Insufficient documentation

## 2015-10-08 DIAGNOSIS — M6289 Other specified disorders of muscle: Secondary | ICD-10-CM | POA: Diagnosis not present

## 2015-10-08 DIAGNOSIS — R6889 Other general symptoms and signs: Secondary | ICD-10-CM | POA: Diagnosis not present

## 2015-10-08 DIAGNOSIS — E2740 Unspecified adrenocortical insufficiency: Secondary | ICD-10-CM

## 2015-10-08 DIAGNOSIS — G458 Other transient cerebral ischemic attacks and related syndromes: Secondary | ICD-10-CM

## 2015-10-08 DIAGNOSIS — I10 Essential (primary) hypertension: Secondary | ICD-10-CM | POA: Diagnosis not present

## 2015-10-08 DIAGNOSIS — Z9114 Patient's other noncompliance with medication regimen: Secondary | ICD-10-CM | POA: Diagnosis not present

## 2015-10-08 DIAGNOSIS — I480 Paroxysmal atrial fibrillation: Secondary | ICD-10-CM | POA: Diagnosis not present

## 2015-10-08 DIAGNOSIS — R410 Disorientation, unspecified: Secondary | ICD-10-CM | POA: Diagnosis not present

## 2015-10-08 LAB — BASIC METABOLIC PANEL
Anion gap: 10 (ref 5–15)
BUN: 17 mg/dL (ref 6–20)
CHLORIDE: 103 mmol/L (ref 101–111)
CO2: 26 mmol/L (ref 22–32)
CREATININE: 0.75 mg/dL (ref 0.44–1.00)
Calcium: 9.3 mg/dL (ref 8.9–10.3)
GFR calc Af Amer: >60 mL/min (ref >60)
GFR calc non Af Amer: >60 mL/min (ref >60)
Glucose, Bld: 176 mg/dL — ABNORMAL HIGH (ref 65–99)
Potassium: 4.1 mmol/L (ref 3.5–5.1)
SODIUM: 139 mmol/L (ref 135–145)

## 2015-10-08 LAB — GLUCOSE, CAPILLARY
Glucose-Capillary: 254 mg/dL — ABNORMAL HIGH (ref 65–99)
Glucose-Capillary: 148 mg/dL — ABNORMAL HIGH (ref 65–99)

## 2015-10-08 MED ORDER — METOPROLOL TARTRATE 25 MG PO TABS: 25.0000 mg | ORAL_TABLET | Freq: Two times a day (BID) | ORAL | Status: DC

## 2015-10-08 MED ORDER — LACTULOSE 10 GM/15ML PO SOLN: 10.0000 g | Freq: Two times a day (BID) | ORAL | Status: DC | PRN

## 2015-10-08 MED ORDER — AMLODIPINE BESYLATE 5 MG PO TABS
5.0000 mg | ORAL_TABLET | Freq: Every day | ORAL | Status: DC
  Administered 2015-10-08: 5 mg via ORAL
  Filled 2015-10-08: qty 1

## 2015-10-08 MED ORDER — AMLODIPINE BESYLATE 5 MG PO TABS: 5.0000 mg | ORAL_TABLET | Freq: Every day | ORAL | Status: DC

## 2015-10-08 MED ORDER — ASPIRIN 81 MG PO TBEC: 81.0000 mg | DELAYED_RELEASE_TABLET | Freq: Every day | ORAL | Status: DC

## 2015-10-08 MED ORDER — LEVOTHYROXINE SODIUM 75 MCG PO TABS: 75.0000 ug | ORAL_TABLET | Freq: Every day | ORAL | Status: DC

## 2015-10-08 MED ORDER — GLUCERNA SHAKE PO LIQD: 237.0000 mL | Freq: Three times a day (TID) | ORAL | Status: DC

## 2015-10-08 NOTE — Progress Notes (Signed)
Patient given pain medication for headache. Also, she did not sleep last night or tonight. Call light within reach.

## 2015-10-08 NOTE — Progress Notes (Signed)
Patient ID: [name], female   DOB: June 23, 1956, 60 y.o.   MRN: [id number]   Subjective: Ms. Bise is feeling much better; her left-sided weakness and stuttering have almost entirely resolved. She denies any palpitations or chest pain, and was able to walk down the hall yesterday.  Objective: Vital signs in last 24 hours: Filed Vitals:   10/07/15 0424 10/07/15 1410 10/07/15 2007 10/08/15 0326  BP: 180/93 153/70 170/90 144/79  Pulse: 123 88 90 81  Temp: 99.5 F (37.5 C) 98.6 F (37 C) 98 F (36.7 C) 98.5 F (36.9 C)  TempSrc: Oral Oral Oral Oral  Resp: 18 18  16   Height:      Weight: 60.646 kg (133 lb 11.2 oz)   61.961 kg (136 lb 9.6 oz)  SpO2:  97% 100% 99%   Physical Exam:  General: Frail elderly white lady lying in bed who appears much older than her age Cardiac: Regular rate and rhythm, 2/6 systolic murmur loudest at the right upper sternal border with radiation to the carotid arteries. Pulm: breathing well, clear to auscultation bilaterally Abd: bowel sounds normoactive, soft, nondistended, tender in epigastrium Ext: warm and well perfused, without pedal edema Skin: Diffuse xerosis. Teres nails. Thin hair and thinning of lateral third of eyebrow. Neuro: alert and oriented to self and date only, cranial nerves II-XII grossly intact, left upper and lower extremity strength greatly improved compared to admission  Lab Results: Basic Metabolic Panel:  Recent Labs Lab 10/04/15 2057  10/06/15 0248 10/08/15 0321  NA  --   < > 140 139  K  --   < > 4.0 4.1  CL  --   < > 107 103  CO2  --   < > 22 26  GLUCOSE  --   < > 169* 176*  BUN  --   < > 7 17  CREATININE  --   < > 0.82 0.75  CALCIUM  --   < > 8.7* 9.3  MG 1.7  --   --   --   < > = values in this interval not displayed.   Thyroid Function Tests:  Recent Labs Lab 10/04/15 2057 10/04/15 2100  TSH  --  22.184*  FREET4 0.62  --    Medications: I have reviewed the patient's current medications. Scheduled  Meds: . apixaban  5 mg Oral BID  . aspirin EC  81 mg Oral Daily  . atorvastatin  40 mg Oral q1800  . dicyclomine  10 mg Oral TID PC & HS  . famotidine  20 mg Oral BID  . feeding supplement (GLUCERNA SHAKE)  237 mL Oral TID PC  . insulin aspart  0-9 Units Subcutaneous TID WC  . insulin detemir  15 Units Subcutaneous Daily  . levothyroxine  75 mcg Oral QAC breakfast  . metoprolol tartrate  25 mg Oral BID  . morphine  30 mg Oral BID  . pantoprazole  40 mg Oral BID  . rifaximin  550 mg Oral BID  . senna  1 tablet Oral BID  . sodium chloride flush  3 mL Intravenous Q12H   Continuous Infusions: . sodium chloride 75 mL/hr at 10/06/15 2232   PRN Meds:.lactulose, ondansetron (ZOFRAN) IV, oxyCODONE   Assessment/Plan:  Suspected tachy-brady syndrome: She has had 9 admissions in the last year for orthostatic hypotension, bradycardia, chronic abdominal pain with fully negative work-ups, and left-sided weakness with no MRI changes. On each admission, her bradycardia improves dramatically after her beta blocker  is held, so I can't help but wonder if she is simply taking her medications inappropriately. Her husband confirmed that she takes them whenever she wants to. Dr. Meda Coffee put her on metoprolol 16m twice daily yesterday after they were held for 3 days because she was so bradycardic. Her rates are looking better, now in 90s, down from 120s. Her pressures have since normalized from 190s to 140s as well. She's added amlodipine 51mdaily. She has follow-up with cardiology on February 6th. -Thanks for your help cardiology and electrophysiology -Continue metoprolol 2528mwice daily -Holding amiodarone 200m70mily -She has cardiology appointment on February 6th  Secondary versus tertiary adrenal insufficiency: Her cortisol was low but responded appropriately to ACTH, so this is likely a pituitary or hypothalamic problem. Her pituitary seems to be working well because her TSH is actually elevated. She  has symptoms of glucocorticoid deficiency such as fatigue, confusion, weakness, lethargy, and chronic abdominal pain, without mineralocorticoid deficiency symptoms, further pointing away from primary adrenal insuffiency. There are reports of opiates causing tertiary adrenal insufficiency which I think is most likely in her case.  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 We will continue her home dose but I suspect we'll stop the amiodarone and her hypothyroidism will likely resolve. I'll send her home on the levothyroxine and have her TSH checked in 3 weeks to see if she still neds it. -Continue levothyroxine 75mc24mily -Will need outpatient TSH in 3 weeks  Left-sided hemiparesis, stuttering, and confusion: Markedly improved; we've ruled out stroke and now I'm wondering if this is more of an endocrine issue per above. Acute intermittent porphyria remains on the differential. -Follow up random urine porphobilinogen level -Follow up serum porphyrin levels   Cryptogenic cirrhosis: She has surprisingly good liver function. Per above, I doubt this is hepatic encephalopathy but we will treat to be thorough. I seriously doubt spontaneous bacterial peritonitis. -Continue rifaximin and lactulose  Paroxysmal atrial fibrillation: She is in normal sinus rhythm now. -Continue apixaban 5mg t59me daily  Dispo: Disposition is deferred at this time, awaiting improvement of current medical problems.  Anticipated discharge in approximately 1-3 day(s).   The patient does have a current PCP (KathePleas [facility]and [facility].  The patient does have transportation limitations that hinder transportation to clinic appointments.  .Services Needed at time of discharge: Y = Yes, Blank = No PT:   OT:   RN:    Equipment:   Other:       Francessca Friis FLoleta Chance/28/2017, 7:15 AM

## 2015-10-08 NOTE — Progress Notes (Signed)
Pt dressed and ready for discharge, tele and IV removed.  Instructions reviewed with pt.  She expresses understanding of importance to make/keep follow up appointments, adhering to medication regime as prescribed and also to follow instructions specific to her healthcare needs.  Pt discharged home with spouse and sons.  She will be following up with home health as well.

## 2015-10-08 NOTE — Progress Notes (Signed)
Pt c/o sharp epigastric pain 9/10.  She explains it as chronic pain but "worse this morning". No outward signs of discomfort, pt eating and drinking with no problems.  She is also walking the halls with front wheel walker independently.    She states she normally takes a muscle relaxer for the pain, but is fine with taking her scheduled po morphine to manage as well as scheduled protonix and pepcid.  RN to reassess.

## 2015-10-08 NOTE — Progress Notes (Addendum)
Patient Name: [name] Date of Encounter: 10/08/2015  Principal Problem:   Left-sided weakness Active Problems:   DEPRESSION/ANXIETY   Chronic pain syndrome   LUMBAR DISC DISPLACEMENT   Essential hypertension   Chronically on opiate therapy   CVA (cerebral infarction)   Hypokalemia   PAF (paroxysmal atrial fibrillation) (HCC)   Prolonged QT interval   Type 2 diabetes mellitus without complication (HCC)   Chest pain   Liver cirrhosis (HCC)   Hypotension   Expressive aphasia   Speech abnormality   TIA (transient ischemic attack)   Length of Stay:   SUBJECTIVE  The patient states that she continues to feel dizzy.  CURRENT MEDS . apixaban  5 mg Oral BID  . aspirin EC  81 mg Oral Daily  . atorvastatin  40 mg Oral q1800  . dicyclomine  10 mg Oral TID PC & HS  . famotidine  20 mg Oral BID  . feeding supplement (GLUCERNA SHAKE)  237 mL Oral TID PC  . insulin aspart  0-9 Units Subcutaneous TID WC  . insulin detemir  15 Units Subcutaneous Daily  . levothyroxine  75 mcg Oral QAC breakfast  . metoprolol tartrate  25 mg Oral BID  . morphine  30 mg Oral BID  . pantoprazole  40 mg Oral BID  . rifaximin  550 mg Oral BID  . senna  1 tablet Oral BID  . sodium chloride flush  3 mL Intravenous Q12H    OBJECTIVE  Filed Vitals:   10/07/15 0424 10/07/15 1410 10/07/15 2007 10/08/15 0326  BP: 180/93 153/70 170/90 144/79  Pulse: 123 88 90 81  Temp: 99.5 F (37.5 C) 98.6 F (37 C) 98 F (36.7 C) 98.5 F (36.9 C)  TempSrc: Oral Oral Oral Oral  Resp: 18 18  16   Height:      Weight: 133 lb 11.2 oz (60.646 kg)   136 lb 9.6 oz (61.961 kg)  SpO2:  97% 100% 99%    Intake/Output Summary (Last 24 hours) at 10/08/15 1040 Last data filed at 10/08/15 0900  Gross per 24 hour  Intake      0 ml  Output    450 ml  Net   -450 ml   Filed Weights   10/06/15 0412 10/07/15 0424 10/08/15 0326  Weight: 133 lb (60.328 kg) 133 lb 11.2 oz (60.646 kg) 136 lb 9.6 oz (61.961 kg)     PHYSICAL EXAM  General: Pleasant, NAD. Neuro: Alert and oriented X 3. Moves all extremities spontaneously. Psych: Normal affect. HEENT:  Normal  Neck: Supple without bruits or JVD. Lungs:  Resp regular and unlabored, CTA. Heart: RRR no s3, s4, or murmurs. Abdomen: Soft, non-tender, non-distended, BS + x 4.  Extremities: No clubbing, cyanosis or edema. DP/PT/Radials 2+ and equal bilaterally.  Accessory Clinical Findings  CBC  Recent Labs  10/06/15 0248  WBC 5.5  HGB 11.8*  HCT 36.3  MCV 87.9  PLT 294*   Basic Metabolic Panel  Recent Labs  10/06/15 0248 10/08/15 0321  NA 140 139  K 4.0 4.1  CL 107 103  CO2 22 26  GLUCOSE 169* 176*  BUN 7 17  CREATININE 0.82 0.75  CALCIUM 8.7* 9.3   Radiology/Studies  X-ray Chest Pa And Lateral  10/05/2015  CLINICAL DATA:  Chest pain. EXAM: CHEST  2 VIEW COMPARISON:  10/04/2015 FINDINGS: Shallow inspiration. The heart size and mediastinal contours are within normal limits. Both lungs are clear. The visualized skeletal structures are  unremarkable. Surgical clips in the right upper quadrant. IMPRESSION: No active cardiopulmonary disease. Electronically Signed   By: Lucienne Capers M.D.   On: 10/05/2015 00:57   Ct Head Wo Contrast  10/04/2015  CLINICAL DATA:  Dizziness for 4 days, fell 2 days ago, RIGHT parietal headache which worsened in route to hospital, slurred speech, limb ataxia, LEFT facial droop, stroke symptoms, history diabetes mellitus, TIA, hypertension, paroxysmal atrial fibrillation, uterine cancer, stroke EXAM: CT HEAD WITHOUT CONTRAST TECHNIQUE: Contiguous axial images were obtained from the base of the skull through the vertex without intravenous contrast. COMPARISON:  07/06/2015 FINDINGS: Minimal age-related atrophy. Normal ventricular morphology. No midline shift or mass effect. Otherwise normal appearance of brain parenchyma. No intracranial hemorrhage, mass lesion or evidence acute infarction. No extra-axial fluid  collections. Sinuses clear and bones unremarkable. IMPRESSION: No acute intracranial abnormalities. Electronically Signed   By: Lavonia Dana M.D.   On: 10/04/2015 13:52   Mr Brain Wo Contrast  10/04/2015  CLINICAL DATA:  60 year old female with dizziness for 4 days, fall 2 days ago with head injury. Worsening symptoms. Initial encounter. EXAM: MRI HEAD WITHOUT CONTRAST TECHNIQUE: Multiplanar, multiecho pulse sequences of the brain and surrounding structures were obtained without intravenous contrast. COMPARISON:  Head CT without contrast 1339 hours today and earlier. Brain MRI 05/20/2015 and earlier. FINDINGS: Major intracranial vascular flow voids are stable and within normal limits. No restricted diffusion to suggest acute infarction. No midline shift, mass effect, evidence of mass lesion, ventriculomegaly, extra-axial collection or acute intracranial hemorrhage. Cervicomedullary junction and pituitary are within normal limits. Negative visualized cervical spine. Stable gray and white matter signal throughout the brain. Scattered mild for age nonspecific mostly subcortical white matter T2 and FLAIR hyperintensity. No cortical encephalomalacia or chronic cerebral blood products. Visible internal auditory structures appear normal. Trace mastoid fluid is unchanged. Negative nasopharynx. Trace ethmoid sinus mucosal thickening is unchanged. Orbit and scalp soft tissues appear stable and within normal limits. Normal bone marrow signal. IMPRESSION: 1.  No acute intracranial abnormality. 2. Stable noncontrast MRI appearance of the brain since September. Mild for age nonspecific cerebral white matter signal changes most commonly due to chronic small vessel disease. 3. Stable mild mastoid effusions, most often postinflammatory. Electronically Signed   By: Genevie Ann M.D.   On: 10/04/2015 15:50   TELE: SR to [place] [facility]  60 year old female history of bradycardia, diabetes mellitus type 1, hypothyroidism,  syncope, anxiety, GERD, TIA, hypertension, paroxysmal atrial fibrillation-On chronic eliquis 2m BID admitted yesterday with weakness and speech deficit. MRI has no acute changes with negative work up for stroke. Back in September she had a syncopal episode and severe bradycardia as well. Her amiodarone was decreased to 200 mg and metoprolol was decreased to 25 mg. QT/QTc at the time was 548/542 ms, on this admission the patient was in profound sinus bradycardia with ventricular rate 36 BPM, QT 622, later 764, the patient was found to have hypokalemia and hypomagnesemia that were replaced. She is now tachycardic with improved QT/QTc 376/470 ms. Amiodarone was discontinued and should not be restarted in the future. Continue low dose metoprolol. Continue synthroid, repeat TSH in 4-6 weeks.   She remains in SR to sinus tachycardia. I would hydrate. Add amlodipine for hypertension. Her tachycardia might be related to withdrawal from etoh and benzodiazepines. Discussed with EP - Dr TLovena Lewho agrees and doesn't feel that she is a candidate for a pacemaker.   Signed, NDorothy SparkMD, FConnecticut Orthopaedic Surgery Center1/28/2017

## 2015-10-09 NOTE — Progress Notes (Signed)
   10/05/15 0917  OT G-codes **NOT FOR INPATIENT CLASS**  Functional Assessment Tool Used clincal observtion and chart review  Functional Limitation Self care  Self Care Current Status (P1980) CK  Self Care Goal Status (I2179) CI  late entry for 10/05/2015 [place], OTR/L [phone] 10/09/2015

## 2015-10-10 ENCOUNTER — Other Ambulatory Visit: Payer: Self-pay | Admitting: *Deleted

## 2015-10-10 NOTE — Patient Outreach (Signed)
Transition of care call (discharged 1/28):  Called pt's home, person answering states pt is not available at this time, pt  will return phone call.    RN CM to wait for return phone call from pt, if no response, will call pt again.     Zara Chess.   [place] [facility]  [phone]

## 2015-10-11 ENCOUNTER — Other Ambulatory Visit: Payer: Self-pay | Admitting: *Deleted

## 2015-10-11 NOTE — Patient Outreach (Signed)
Another attempt  made to f/u with pt post hospitalization (transition of care - discharged 1/28), first call 1/30- pt not available to talk.    Today's attempt- phone kept ringing then appeared go to fax, unable to leave a voice message.  Will try again.     Zara Chess.   [place] [facility]  [phone]

## 2015-10-12 ENCOUNTER — Other Ambulatory Visit: Payer: Self-pay | Admitting: *Deleted

## 2015-10-12 NOTE — Patient Outreach (Signed)
Another attempt made to contact pt as part of transition of care. Unable to leave a voice message as phone kept ringing.   Will try again.     Zara Chess.   [place] [facility]  [phone]

## 2015-10-13 LAB — PORPHYRINS, FRACTIONATION-PLASMA
Coproporphyrin.: <1 ug/dL (ref 0.0–1.0)
Hexacarboxyl Porphyrins: <1 ug/dL (ref 0.0–1.0)
Pentacarboxyl Porphyrins: <1 ug/dL (ref 0.0–1.0)

## 2015-10-14 ENCOUNTER — Emergency Department: Payer: Commercial Managed Care - HMO

## 2015-10-14 ENCOUNTER — Observation Stay
Admission: EM | Admit: 2015-10-14 | Discharge: 2015-10-16 | Disposition: A | Discharge status: Home / Self Care | Payer: Commercial Managed Care - HMO | Attending: Internal Medicine | Admitting: Internal Medicine

## 2015-10-14 ENCOUNTER — Encounter: Payer: Self-pay | Admitting: Emergency Medicine

## 2015-10-14 DIAGNOSIS — D649 Anemia, unspecified: Secondary | ICD-10-CM | POA: Diagnosis not present

## 2015-10-14 DIAGNOSIS — R55 Syncope and collapse: Secondary | ICD-10-CM | POA: Diagnosis not present

## 2015-10-14 DIAGNOSIS — R000 Tachycardia, unspecified: Secondary | ICD-10-CM | POA: Diagnosis not present

## 2015-10-14 DIAGNOSIS — K2970 Gastritis, unspecified, without bleeding: Secondary | ICD-10-CM | POA: Diagnosis not present

## 2015-10-14 DIAGNOSIS — F329 Major depressive disorder, single episode, unspecified: Secondary | ICD-10-CM | POA: Diagnosis not present

## 2015-10-14 DIAGNOSIS — R778 Other specified abnormalities of plasma proteins: Secondary | ICD-10-CM | POA: Insufficient documentation

## 2015-10-14 DIAGNOSIS — I959 Hypotension, unspecified: Secondary | ICD-10-CM | POA: Diagnosis not present

## 2015-10-14 DIAGNOSIS — E119 Type 2 diabetes mellitus without complications: Secondary | ICD-10-CM | POA: Diagnosis not present

## 2015-10-14 DIAGNOSIS — I10 Essential (primary) hypertension: Secondary | ICD-10-CM | POA: Diagnosis present

## 2015-10-14 DIAGNOSIS — K219 Gastro-esophageal reflux disease without esophagitis: Secondary | ICD-10-CM | POA: Diagnosis present

## 2015-10-14 DIAGNOSIS — Z881 Allergy status to other antibiotic agents status: Secondary | ICD-10-CM | POA: Insufficient documentation

## 2015-10-14 DIAGNOSIS — R161 Splenomegaly, not elsewhere classified: Secondary | ICD-10-CM | POA: Insufficient documentation

## 2015-10-14 DIAGNOSIS — K7460 Unspecified cirrhosis of liver: Secondary | ICD-10-CM | POA: Insufficient documentation

## 2015-10-14 DIAGNOSIS — G894 Chronic pain syndrome: Secondary | ICD-10-CM | POA: Insufficient documentation

## 2015-10-14 DIAGNOSIS — Z887 Allergy status to serum and vaccine status: Secondary | ICD-10-CM | POA: Diagnosis not present

## 2015-10-14 DIAGNOSIS — E032 Hypothyroidism due to medicaments and other exogenous substances: Secondary | ICD-10-CM | POA: Diagnosis present

## 2015-10-14 DIAGNOSIS — I951 Orthostatic hypotension: Secondary | ICD-10-CM | POA: Insufficient documentation

## 2015-10-14 DIAGNOSIS — Z7982 Long term (current) use of aspirin: Secondary | ICD-10-CM | POA: Insufficient documentation

## 2015-10-14 DIAGNOSIS — N200 Calculus of kidney: Secondary | ICD-10-CM | POA: Insufficient documentation

## 2015-10-14 DIAGNOSIS — F419 Anxiety disorder, unspecified: Secondary | ICD-10-CM | POA: Diagnosis not present

## 2015-10-14 DIAGNOSIS — E876 Hypokalemia: Secondary | ICD-10-CM | POA: Diagnosis not present

## 2015-10-14 DIAGNOSIS — E785 Hyperlipidemia, unspecified: Secondary | ICD-10-CM | POA: Diagnosis not present

## 2015-10-14 DIAGNOSIS — K5790 Diverticulosis of intestine, part unspecified, without perforation or abscess without bleeding: Secondary | ICD-10-CM | POA: Diagnosis not present

## 2015-10-14 DIAGNOSIS — K7469 Other cirrhosis of liver: Secondary | ICD-10-CM | POA: Diagnosis present

## 2015-10-14 DIAGNOSIS — E039 Hypothyroidism, unspecified: Secondary | ICD-10-CM | POA: Insufficient documentation

## 2015-10-14 DIAGNOSIS — Z8542 Personal history of malignant neoplasm of other parts of uterus: Secondary | ICD-10-CM | POA: Insufficient documentation

## 2015-10-14 DIAGNOSIS — E1165 Type 2 diabetes mellitus with hyperglycemia: Secondary | ICD-10-CM

## 2015-10-14 DIAGNOSIS — R197 Diarrhea, unspecified: Secondary | ICD-10-CM | POA: Diagnosis not present

## 2015-10-14 DIAGNOSIS — R001 Bradycardia, unspecified: Secondary | ICD-10-CM | POA: Diagnosis not present

## 2015-10-14 DIAGNOSIS — Z90710 Acquired absence of both cervix and uterus: Secondary | ICD-10-CM | POA: Diagnosis not present

## 2015-10-14 DIAGNOSIS — I4891 Unspecified atrial fibrillation: Secondary | ICD-10-CM | POA: Diagnosis present

## 2015-10-14 DIAGNOSIS — G9341 Metabolic encephalopathy: Secondary | ICD-10-CM | POA: Insufficient documentation

## 2015-10-14 DIAGNOSIS — T462X1A Poisoning by other antidysrhythmic drugs, accidental (unintentional), initial encounter: Secondary | ICD-10-CM | POA: Diagnosis present

## 2015-10-14 DIAGNOSIS — R188 Other ascites: Secondary | ICD-10-CM | POA: Insufficient documentation

## 2015-10-14 DIAGNOSIS — I480 Paroxysmal atrial fibrillation: Secondary | ICD-10-CM | POA: Insufficient documentation

## 2015-10-14 DIAGNOSIS — K567 Ileus, unspecified: Secondary | ICD-10-CM | POA: Diagnosis not present

## 2015-10-14 DIAGNOSIS — E440 Moderate protein-calorie malnutrition: Secondary | ICD-10-CM | POA: Insufficient documentation

## 2015-10-14 DIAGNOSIS — M5126 Other intervertebral disc displacement, lumbar region: Secondary | ICD-10-CM | POA: Insufficient documentation

## 2015-10-14 DIAGNOSIS — Z9049 Acquired absence of other specified parts of digestive tract: Secondary | ICD-10-CM | POA: Diagnosis not present

## 2015-10-14 DIAGNOSIS — Z888 Allergy status to other drugs, medicaments and biological substances status: Secondary | ICD-10-CM | POA: Diagnosis not present

## 2015-10-14 DIAGNOSIS — Z8673 Personal history of transient ischemic attack (TIA), and cerebral infarction without residual deficits: Secondary | ICD-10-CM | POA: Insufficient documentation

## 2015-10-14 DIAGNOSIS — Z885 Allergy status to narcotic agent status: Secondary | ICD-10-CM | POA: Insufficient documentation

## 2015-10-14 DIAGNOSIS — I4581 Long QT syndrome: Secondary | ICD-10-CM | POA: Diagnosis not present

## 2015-10-14 DIAGNOSIS — R079 Chest pain, unspecified: Secondary | ICD-10-CM | POA: Diagnosis not present

## 2015-10-14 DIAGNOSIS — Z8249 Family history of ischemic heart disease and other diseases of the circulatory system: Secondary | ICD-10-CM | POA: Diagnosis not present

## 2015-10-14 DIAGNOSIS — K3184 Gastroparesis: Secondary | ICD-10-CM | POA: Diagnosis not present

## 2015-10-14 DIAGNOSIS — R531 Weakness: Secondary | ICD-10-CM | POA: Insufficient documentation

## 2015-10-14 DIAGNOSIS — E109 Type 1 diabetes mellitus without complications: Secondary | ICD-10-CM | POA: Insufficient documentation

## 2015-10-14 DIAGNOSIS — M791 Myalgia: Secondary | ICD-10-CM | POA: Diagnosis not present

## 2015-10-14 DIAGNOSIS — Z794 Long term (current) use of insulin: Secondary | ICD-10-CM | POA: Insufficient documentation

## 2015-10-14 DIAGNOSIS — R809 Proteinuria, unspecified: Secondary | ICD-10-CM | POA: Diagnosis not present

## 2015-10-14 DIAGNOSIS — A084 Viral intestinal infection, unspecified: Secondary | ICD-10-CM | POA: Diagnosis not present

## 2015-10-14 DIAGNOSIS — R109 Unspecified abdominal pain: Secondary | ICD-10-CM | POA: Insufficient documentation

## 2015-10-14 DIAGNOSIS — Z7901 Long term (current) use of anticoagulants: Secondary | ICD-10-CM | POA: Diagnosis not present

## 2015-10-14 DIAGNOSIS — E860 Dehydration: Secondary | ICD-10-CM | POA: Insufficient documentation

## 2015-10-14 DIAGNOSIS — K449 Diaphragmatic hernia without obstruction or gangrene: Secondary | ICD-10-CM | POA: Diagnosis not present

## 2015-10-14 DIAGNOSIS — Z8711 Personal history of peptic ulcer disease: Secondary | ICD-10-CM | POA: Diagnosis not present

## 2015-10-14 DIAGNOSIS — J9811 Atelectasis: Secondary | ICD-10-CM | POA: Insufficient documentation

## 2015-10-14 DIAGNOSIS — R112 Nausea with vomiting, unspecified: Secondary | ICD-10-CM | POA: Diagnosis present

## 2015-10-14 DIAGNOSIS — I248 Other forms of acute ischemic heart disease: Secondary | ICD-10-CM | POA: Insufficient documentation

## 2015-10-14 DIAGNOSIS — F1110 Opioid abuse, uncomplicated: Secondary | ICD-10-CM | POA: Diagnosis not present

## 2015-10-14 DIAGNOSIS — R1012 Left upper quadrant pain: Secondary | ICD-10-CM | POA: Diagnosis not present

## 2015-10-14 DIAGNOSIS — R1032 Left lower quadrant pain: Secondary | ICD-10-CM | POA: Diagnosis not present

## 2015-10-14 HISTORY — DX: Cerebral infarction, unspecified: I63.9

## 2015-10-14 LAB — COMPREHENSIVE METABOLIC PANEL
ALK PHOS: 95 U/L (ref 38–126)
ALT: 30 U/L (ref 14–54)
ANION GAP: 10 (ref 5–15)
AST: 34 U/L (ref 15–41)
Albumin: 3.7 g/dL (ref 3.5–5.0)
BILIRUBIN TOTAL: 1 mg/dL (ref 0.3–1.2)
BUN: 15 mg/dL (ref 6–20)
CALCIUM: 8.7 mg/dL — AB (ref 8.9–10.3)
CO2: 21 mmol/L — ABNORMAL LOW (ref 22–32)
CREATININE: 0.6 mg/dL (ref 0.44–1.00)
Chloride: 108 mmol/L (ref 101–111)
GFR calc non Af Amer: >60 mL/min (ref >60)
Glucose, Bld: 219 mg/dL — ABNORMAL HIGH (ref 65–99)
Potassium: 3.5 mmol/L (ref 3.5–5.1)
Sodium: 139 mmol/L (ref 135–145)
TOTAL PROTEIN: 6.9 g/dL (ref 6.5–8.1)

## 2015-10-14 LAB — CBC
HCT: 35.9 % (ref 35.0–47.0)
HEMOGLOBIN: 11.5 g/dL — AB (ref 12.0–16.0)
MCH: 27.7 pg (ref 26.0–34.0)
MCHC: 32 g/dL (ref 32.0–36.0)
MCV: 86.5 fL (ref 80.0–100.0)
PLATELETS: 105 10*3/uL — AB (ref 150–440)
RBC: 4.15 MIL/uL (ref 3.80–5.20)
RDW: 17.2 % — ABNORMAL HIGH (ref 11.5–14.5)
WBC: 7.4 10*3/uL (ref 3.6–11.0)

## 2015-10-14 LAB — LIPASE, BLOOD: Lipase: 23 U/L (ref 11–51)

## 2015-10-14 MED ORDER — SODIUM CHLORIDE 0.9 % IV BOLUS (SEPSIS)
1000.0000 mL | Freq: Once | INTRAVENOUS | Status: AC
  Administered 2015-10-14: 1000 mL via INTRAVENOUS

## 2015-10-14 MED ORDER — PROMETHAZINE HCL 25 MG/ML IJ SOLN
25.0000 mg | Freq: Once | INTRAMUSCULAR | Status: AC
  Administered 2015-10-14: 25 mg via INTRAMUSCULAR

## 2015-10-14 MED ORDER — ONDANSETRON HCL 4 MG/2ML IJ SOLN
4.0000 mg | Freq: Once | INTRAMUSCULAR | Status: AC
  Administered 2015-10-14: 4 mg via INTRAVENOUS
  Filled 2015-10-14: qty 2

## 2015-10-14 MED ORDER — PROMETHAZINE HCL 25 MG/ML IJ SOLN
INTRAMUSCULAR | Status: AC
  Administered 2015-10-14: 25 mg via INTRAMUSCULAR
  Filled 2015-10-14: qty 1

## 2015-10-14 MED ORDER — MORPHINE SULFATE (PF) 4 MG/ML IV SOLN
INTRAVENOUS | Status: AC
  Filled 2015-10-14: qty 1

## 2015-10-14 MED ORDER — LOPERAMIDE HCL 2 MG PO CAPS
2.0000 mg | ORAL_CAPSULE | Freq: Once | ORAL | Status: AC
  Administered 2015-10-14: 2 mg via ORAL
  Filled 2015-10-14: qty 1

## 2015-10-14 MED ORDER — MORPHINE SULFATE (PF) 4 MG/ML IV SOLN
4.0000 mg | Freq: Once | INTRAVENOUS | Status: AC
  Administered 2015-10-14: 4 mg via INTRAVENOUS

## 2015-10-14 MED ORDER — MORPHINE SULFATE (PF) 4 MG/ML IV SOLN
4.0000 mg | Freq: Once | INTRAVENOUS | Status: AC
  Administered 2015-10-14: 4 mg via INTRAVENOUS
  Filled 2015-10-14: qty 1

## 2015-10-14 MED ORDER — IOHEXOL 240 MG/ML SOLN
25.0000 mL | Freq: Once | INTRAMUSCULAR | Status: AC | PRN
  Administered 2015-10-14: 25 mL via ORAL

## 2015-10-14 MED ORDER — IOHEXOL 300 MG/ML  SOLN
100.0000 mL | Freq: Once | INTRAMUSCULAR | Status: AC | PRN
  Administered 2015-10-14: 100 mL via INTRAVENOUS

## 2015-10-14 NOTE — ED Notes (Signed)
Assisted patient to the bathroom for BM, patient had small amount of mucus and yellow liquid stool. Unable to urinate at this time.  Pt ambulatory without difficulty.

## 2015-10-14 NOTE — ED Notes (Signed)
Pt arrived via EMS from home. Pt c/o epigastric abdominal pain that started 3 days ago. Pt has had nausea and vomiting for the past 3 days. Pain is 7/10. Recently in [facility] for 5 days s/p CVA and was released on 1/28.  Pt given 25m of Zofran IV by EMS.

## 2015-10-14 NOTE — ED Notes (Signed)
Pt awaiting admission

## 2015-10-14 NOTE — ED Notes (Signed)
MD in to admit

## 2015-10-14 NOTE — H&P (Signed)
[facility] [place] NAME: [name]    MR#:  [id number]  DATE OF BIRTH:  09-29-1955  DATE OF ADMISSION:  10/14/2015  PRIMARY CARE PHYSICIAN: Maryland Pink, MD   REQUESTING/REFERRING PHYSICIAN: Jacqualine Code, MD  CHIEF COMPLAINT:   Chief Complaint  Patient presents with  . Abdominal Pain    HISTORY OF PRESENT ILLNESS:  [name]  is a 60 y.o. female who presents with resident abdominal pain with nausea and vomiting for 4 days. Patient states that she came to the ED today because she was unable to control her vomiting. She has a history of multiple hospitalizations with nausea vomiting abdominal pain. She has a history of cirrhosis with ascites. She also has a history of gastroparesis. Initial workup in the ED largely benign. CT scan did show an area which the radiologist questioned as possible colitis. Patient does state she's been having some diarrhea during this time as well. She denies any fever/chills/dysuria/other infectious symptoms. After multiple rounds of antiemetics in the ED with minimal effect, hospitalists were called for admission  PAST MEDICAL HISTORY:   Past Medical History  Diagnosis Date  . Type 1 diabetes ([place])   . Hypothyroid   . Degenerative disk disease   . Stomach ulcer   . Diverticulitis   . Syncope 01/2015  . Anxiety   . GERD (gastroesophageal reflux disease)   . History of hiatal hernia   . Cancer (HCC)     HX OF CANCER OF UTERUS   . TIA (transient ischemic attack) 02/2015  . Hypertension   . Pancreatitis   . PAF (paroxysmal atrial fibrillation) ([place]) 03/2015    a. new onset 03/2015 in setting of intractable N/V; b. on Eliquis 5 mg bid; c. CHADSVASc 4 (DM, TIA x 2, female)  . Intussusception intestine ([place]) 05/2015  . Stroke [facility])     PAST SURGICAL HISTORY:   Past Surgical History  Procedure Laterality Date  . Hernia repair    . Abdominal hysterectomy    . Cholecystectomy    .  Esophagogastroduodenoscopy N/A 04/04/2015    Procedure: ESOPHAGOGASTRODUODENOSCOPY (EGD);  Surgeon: Hulen Luster, MD;  Location: [facility] ENDOSCOPY;  Service: Endoscopy;  Laterality: N/A;    SOCIAL HISTORY:   Social History  Substance Use Topics  . Smoking status: Never Smoker   . Smokeless tobacco: Never Used  . Alcohol Use: No    FAMILY HISTORY:   Family History  Problem Relation Age of Onset  . Hypertension Mother   . CAD Sister   . Heart attack Sister     Deceased 11/19/14  . CAD Brother     DRUG ALLERGIES:   Allergies  Allergen Reactions  . Erythromycin Base Other (See Comments)    Reaction:  Fever   . Prednisone     unknown  . Rosiglitazone Maleate Swelling  . Codeine Sulfate Rash  . Tetanus-Diphtheria Toxoids Td Rash and Other (See Comments)    Reaction:  Fever     MEDICATIONS AT HOME:   Prior to Admission medications   Medication Sig Start Date End Date Taking? Authorizing Provider  amLODipine (NORVASC) 5 MG tablet Take 1 tablet (5 mg total) by mouth daily. 10/08/15  Yes Loleta Chance, MD  apixaban (ELIQUIS) 5 MG TABS tablet Take 1 tablet (5 mg total) by mouth 2 (two) times daily. 04/07/15  Yes Bettey Costa, MD  aspirin EC 81 MG EC tablet Take 1 tablet (81 mg total) by mouth daily.  10/08/15  Yes Loleta Chance, MD  dicyclomine (BENTYL) 10 MG capsule Take 1 capsule by mouth 4 (four) times daily - after meals and at bedtime.  03/11/15  Yes Historical Provider, MD  feeding supplement, GLUCERNA SHAKE, (GLUCERNA SHAKE) LIQD Take 237 mLs by mouth 3 (three) times daily after meals. 10/08/15  Yes Loleta Chance, MD  lactulose (CHRONULAC) 10 GM/15ML solution Take 15-45 mLs (10-30 g total) by mouth 2 (two) times daily as needed (P). 10/08/15  Yes Loleta Chance, MD  levothyroxine (SYNTHROID, LEVOTHROID) 75 MCG tablet Take 1 tablet (75 mcg total) by mouth daily before breakfast. 10/08/15  Yes Loleta Chance, MD  metoprolol tartrate (LOPRESSOR) 25 MG tablet Take 1 tablet (25 mg total) by mouth 2 (two)  times daily. 10/08/15  Yes Loleta Chance, MD  tiZANidine (ZANAFLEX) 4 MG tablet Take 4 mg by mouth 3 (three) times daily as needed for muscle spasms.  08/31/15  Yes Historical Provider, MD  zolpidem (AMBIEN) 10 MG tablet Take 10 mg by mouth at bedtime. Reported on 10/04/2015   Yes Historical Provider, MD  atorvastatin (LIPITOR) 40 MG tablet Take 1 tablet (40 mg total) by mouth daily at 6 PM. Patient not taking: Reported on 10/04/2015 02/17/15   Bettey Costa, MD  insulin detemir (LEVEMIR) 100 UNIT/ML injection Inject 0.17 mLs (17 Units total) into the skin daily. Patient not taking: Reported on 10/04/2015 07/11/15   Theodoro Grist, MD  lisinopril (PRINIVIL,ZESTRIL) 10 MG tablet Take 0.5 tablets (5 mg total) by mouth 2 (two) times daily. Patient not taking: Reported on 10/04/2015 07/11/15   Theodoro Grist, MD  morphine (MS CONTIN) 60 MG 12 hr tablet Take 1 tablet (60 mg total) by mouth 2 (two) times daily. Patient not taking: Reported on 10/04/2015 07/11/15   Theodoro Grist, MD  ondansetron (ZOFRAN) 4 MG tablet Take 1 tablet (4 mg total) by mouth daily as needed for nausea or vomiting. Patient not taking: Reported on 10/04/2015 08/16/15   Nicholes Mango, MD  oxyCODONE-acetaminophen (ROXICET) 5-325 MG tablet Take 1 tablet by mouth every 6 (six) hours as needed. Patient not taking: Reported on 10/04/2015 08/16/15   Nicholes Mango, MD  pantoprazole (PROTONIX) 40 MG tablet Take 1 tablet (40 mg total) by mouth 2 (two) times daily. Patient not taking: Reported on 10/04/2015 04/07/15   Bettey Costa, MD  rifaximin (XIFAXAN) 550 MG TABS tablet Take 1 tablet (550 mg total) by mouth 2 (two) times daily. Patient not taking: Reported on 10/04/2015 07/11/15   Theodoro Grist, MD    REVIEW OF SYSTEMS:  Review of Systems  Constitutional: Negative for fever, chills, weight loss and malaise/fatigue.  HENT: Negative for ear pain, hearing loss and tinnitus.   Eyes: Negative for blurred vision, double vision, pain and redness.  Respiratory:  Negative for cough, hemoptysis and shortness of breath.   Cardiovascular: Negative for chest pain, palpitations, orthopnea and leg swelling.  Gastrointestinal: Positive for nausea, vomiting, abdominal pain and diarrhea. Negative for constipation.  Genitourinary: Negative for dysuria, frequency and hematuria.  Musculoskeletal: Negative for back pain, joint pain and neck pain.  Skin:       No acne, rash, or lesions  Neurological: Negative for dizziness, tremors, focal weakness and weakness.  Endo/Heme/Allergies: Negative for polydipsia. Does not bruise/bleed easily.  Psychiatric/Behavioral: Negative for depression. The patient is not nervous/anxious and does not have insomnia.      VITAL SIGNS:   Filed Vitals:   10/14/15 1653 10/14/15 1930 10/14/15 2100 10/14/15 2230  BP: 180/94 206/91 187/85  195/84  Pulse: 101 97 88 94  Temp: 98.4 F (36.9 C)     TempSrc: Oral     Resp: 17 18 19 16   SpO2: 98% 99% 98% 97%   Wt Readings from Last 3 Encounters:  10/08/15 61.961 kg (136 lb 9.6 oz)  08/16/15 64.638 kg (142 lb 8 oz)  08/06/15 68.493 kg (151 lb)    PHYSICAL EXAMINATION:  Physical Exam  Vitals reviewed. Constitutional: She is oriented to person, place, and time. She appears well-developed and well-nourished. No distress.  HENT:  Head: Normocephalic and atraumatic.  Mouth/Throat: Oropharynx is clear and moist.  Eyes: Conjunctivae and EOM are normal. Pupils are equal, round, and reactive to light. No scleral icterus.  Neck: Normal range of motion. Neck supple. No JVD present. No thyromegaly present.  Cardiovascular: Normal rate, regular rhythm and intact distal pulses.  Exam reveals no gallop and no friction rub.   No murmur heard. Respiratory: Effort normal and breath sounds normal. No respiratory distress. She has no wheezes. She has no rales.  GI: Soft. She exhibits distension (mild). There is tenderness (epigastric).  Hypoactive bowel sounds  Musculoskeletal: Normal range of  motion. She exhibits no edema.  No arthritis, no gout  Lymphadenopathy:    She has no cervical adenopathy.  Neurological: She is alert and oriented to person, place, and time. No cranial nerve deficit.  No dysarthria, no aphasia  Skin: Skin is warm and dry. No rash noted. No erythema.  Psychiatric: She has a normal mood and affect. Her behavior is normal. Judgment and thought content normal.    LABORATORY PANEL:   CBC  Recent Labs Lab 10/14/15 1658  WBC 7.4  HGB 11.5*  HCT 35.9  PLT 105*   ------------------------------------------------------------------------------------------------------------------  Chemistries   Recent Labs Lab 10/14/15 1658  NA 139  K 3.5  CL 108  CO2 21*  GLUCOSE 219*  BUN 15  CREATININE 0.60  CALCIUM 8.7*  AST 34  ALT 30  ALKPHOS 95  BILITOT 1.0   ------------------------------------------------------------------------------------------------------------------  Cardiac Enzymes No results for input(s): TROPONINI in the last 168 hours. ------------------------------------------------------------------------------------------------------------------  RADIOLOGY:  Ct Abdomen Pelvis W Contrast  10/14/2015  CLINICAL DATA:  Lumbar spine degenerative changes. EXAM: CT ABDOMEN AND PELVIS WITH CONTRAST TECHNIQUE: Multidetector CT imaging of the abdomen and pelvis was performed using the standard protocol following bolus administration of intravenous contrast. CONTRAST:  139m OMNIPAQUE IOHEXOL 300 MG/ML  SOLN COMPARISON:  CT abdomen pelvis 08/14/2015 FINDINGS: Lower chest: Normal heart size. Dependent atelectasis within the bilateral lower lobes. No pleural effusion. Hepatobiliary: Liver is small and nodular in contour compatible with cirrhosis. Patient status post cholecystectomy. No intrahepatic or extrahepatic biliary ductal dilatation. Pancreas: Unremarkable Spleen: Mild splenomegaly measuring 13 cm. Adrenals/Urinary Tract: The adrenal glands are  normal. Kidneys enhance symmetrically with contrast. There is a 9 mm nonobstructing stone within the inferior pole of the right kidney. 2 mm nonobstructing stone interpolar region left kidney (image 114; series 6). Bilateral too small to characterize low-attenuation renal lesions. No hydronephrosis. The urinary bladder is unremarkable. Stomach/Bowel: Descending colon is decompressed, limiting evaluation however there is suggestion of wall thickening of the descending and sigmoid colon. No abnormal bowel wall thickening or evidence for bowel obstruction. No free intraperitoneal air. Small hiatal hernia. Normal morphology to the stomach. Vascular/Lymphatic: Normal caliber abdominal aorta. No retroperitoneal lymphadenopathy. Other: Patient status post hysterectomy. There is a moderate amount of ascites throughout the abdomen. Cannot exclude a small amount of peritoneal enhancement, particularly within the  pelvis (image 82; series 2). Musculoskeletal: No aggressive or acute appearing osseous lesions. IMPRESSION: Suggestion of wall thickening of the descending and sigmoid colon which is nonspecific and may partially be secondary to decompressed state. Colitis is not excluded. Recommend clinical evaluation. Morphologic changes to the liver compatible with cirrhosis. Moderate volume ascites. Possible mild peritoneal enhancement, particularly within the lower abdomen and pelvis. Peritonitis is not excluded. Mild splenomegaly. Bilateral nonobstructing nephrolithiasis. Electronically Signed   By: Lovey Newcomer M.D.   On: 10/14/2015 19:44    EKG:   Orders placed or performed during the hospital encounter of 10/14/15  . ED EKG  . ED EKG  . EKG 12-Lead  . EKG 12-Lead    IMPRESSION AND PLAN:  Principal Problem:   Intractable nausea and vomiting - unclear etiology. CT scan with some suggestion of colitis, though not definite. Suspect more likely gastroparesis, see below for treatment. When necessary  antiemetics. Active Problems:   Gastroparesis - patient has an allergy to erythromycin. Will use Reglan as promotility agent. Nothing by mouth tonight, then would recommend advance diet as tolerated.   Essential hypertension - home meds plus additional when necessary antihypertensives for blood pressure goal less than 160/100   Type 2 diabetes mellitus without complication (HCC) - sliding scale insulin with corresponding glucose checks every 6 hours while nothing by mouth.   Paroxysmal atrial fibrillation (HCC) - currently rate controlled, continue rate controlling medications, continue home dose anticoagulant   GERD - home dose PPI   Hypothyroidism - home dose thyroid replacement   Liver cirrhosis ([place]) - avoid Tylenol, avoid other hepatotoxins, monitor  All the records are reviewed and case discussed with ED provider. Management plans discussed with the patient and/or family.  DVT PROPHYLAXIS: Systemic anticoagulation  GI PROPHYLAXIS: PPI  ADMISSION STATUS: Observation  CODE STATUS: Full Code Status History    Date Active Date Inactive Code Status Order ID Comments User Context   10/04/2015  8:19 PM 10/08/2015  6:34 PM Full Code [id number]  Bethena Roys, MD Inpatient   08/15/2015  1:05 AM 08/16/2015  8:04 PM Full Code [id number]  Harrie Foreman, MD Inpatient   08/01/2015  1:25 AM 08/06/2015  5:41 PM Full Code [id number]  Harrie Foreman, MD Inpatient   07/04/2015  2:53 AM 07/11/2015  6:33 PM Full Code [id number]  Lytle Butte, MD ED   05/27/2015 10:48 PM 05/31/2015  2:37 PM Full Code [id number]  Rise Patience, MD Inpatient   05/19/2015  1:14 AM 05/22/2015  4:28 PM Full Code [id number]  Rise Patience, MD Inpatient   04/05/2015  9:16 AM 04/07/2015  5:23 PM Full Code [id number]  Bettey Costa, MD Inpatient   04/03/2015 12:31 PM 04/05/2015  9:16 AM Full Code [id number]  Bettey Costa, MD Inpatient   03/11/2015  4:49 PM 03/14/2015  3:50 PM Full Code [id number]  Dustin Flock, MD Inpatient    02/15/2015  4:51 PM 02/17/2015  2:33 PM DNR [id number]  Dustin Flock, MD Inpatient   02/15/2015  4:35 PM 02/15/2015  4:51 PM Full Code [id number]  Dustin Flock, MD Inpatient   01/11/2015  2:40 AM 01/12/2015  6:11 PM Full Code [id number]  Etta Quill, DO ED      TOTAL TIME TAKING CARE OF THIS PATIENT: 40 minutes.    Zeus Marquis Genola 10/14/2015, 11:55 PM  [facility]  Office  [phone]  CC: Primary care physician; [place] Pink, MD

## 2015-10-14 NOTE — ED Notes (Signed)
Pt still co 9/10 abd pain, no nausea at this time.  Awaiting to see hospitalist.

## 2015-10-14 NOTE — ED Notes (Signed)
Report received, care assumed.  Pt still co abd pain states nausea is better.  No distress at this time awaiting CT results.

## 2015-10-14 NOTE — ED Notes (Signed)
Pt awaiting disposition, observed resting with eyes closed.

## 2015-10-15 DIAGNOSIS — E119 Type 2 diabetes mellitus without complications: Secondary | ICD-10-CM | POA: Diagnosis not present

## 2015-10-15 DIAGNOSIS — K3184 Gastroparesis: Secondary | ICD-10-CM | POA: Diagnosis not present

## 2015-10-15 DIAGNOSIS — R112 Nausea with vomiting, unspecified: Secondary | ICD-10-CM | POA: Diagnosis not present

## 2015-10-15 DIAGNOSIS — I10 Essential (primary) hypertension: Secondary | ICD-10-CM | POA: Diagnosis not present

## 2015-10-15 LAB — BASIC METABOLIC PANEL
ANION GAP: 11 (ref 5–15)
BUN: 17 mg/dL (ref 6–20)
CHLORIDE: 106 mmol/L (ref 101–111)
CO2: 22 mmol/L (ref 22–32)
Calcium: 8.5 mg/dL — ABNORMAL LOW (ref 8.9–10.3)
Creatinine, Ser: 0.66 mg/dL (ref 0.44–1.00)
GFR calc Af Amer: >60 mL/min (ref >60)
GLUCOSE: 311 mg/dL — AB (ref 65–99)
POTASSIUM: 3.9 mmol/L (ref 3.5–5.1)
Sodium: 139 mmol/L (ref 135–145)

## 2015-10-15 LAB — GLUCOSE, CAPILLARY
GLUCOSE-CAPILLARY: 208 mg/dL — AB (ref 65–99)
GLUCOSE-CAPILLARY: 112 mg/dL — AB (ref 65–99)
GLUCOSE-CAPILLARY: 190 mg/dL — AB (ref 65–99)
Glucose-Capillary: 281 mg/dL — ABNORMAL HIGH (ref 65–99)

## 2015-10-15 LAB — URINALYSIS COMPLETE WITH MICROSCOPIC (ARMC ONLY)
BILIRUBIN URINE: NEGATIVE
Bacteria, UA: NONE SEEN
Glucose, UA: >500 mg/dL — AB
LEUKOCYTES UA: NEGATIVE
NITRITE: NEGATIVE
PH: 6 (ref 5.0–8.0)
Protein, ur: NEGATIVE mg/dL
SPECIFIC GRAVITY, URINE: 1.042 — AB (ref 1.005–1.030)

## 2015-10-15 LAB — HEMOGLOBIN A1C: HEMOGLOBIN A1C: 7.5 % — AB (ref 4.0–6.0)

## 2015-10-15 LAB — CBC
HEMATOCRIT: 35.7 % (ref 35.0–47.0)
HEMOGLOBIN: 11.7 g/dL — AB (ref 12.0–16.0)
MCH: 28.2 pg (ref 26.0–34.0)
MCHC: 32.8 g/dL (ref 32.0–36.0)
MCV: 86.1 fL (ref 80.0–100.0)
Platelets: 134 10*3/uL — ABNORMAL LOW (ref 150–440)
RBC: 4.14 MIL/uL (ref 3.80–5.20)
RDW: 17.6 % — ABNORMAL HIGH (ref 11.5–14.5)
WBC: 10.4 10*3/uL (ref 3.6–11.0)

## 2015-10-15 LAB — PROTIME-INR
INR: 1.32
Prothrombin Time: 16.5 seconds — ABNORMAL HIGH (ref 11.4–15.0)

## 2015-10-15 MED ORDER — HYDRALAZINE HCL 20 MG/ML IJ SOLN
10.0000 mg | INTRAMUSCULAR | Status: DC | PRN
  Administered 2015-10-15 (×2): 10 mg via INTRAVENOUS
  Filled 2015-10-15: qty 1

## 2015-10-15 MED ORDER — HYDRALAZINE HCL 20 MG/ML IJ SOLN
INTRAMUSCULAR | Status: AC
  Administered 2015-10-15: 10 mg via INTRAVENOUS
  Filled 2015-10-15: qty 1

## 2015-10-15 MED ORDER — ASPIRIN EC 81 MG PO TBEC
81.0000 mg | DELAYED_RELEASE_TABLET | Freq: Every day | ORAL | Status: DC
  Administered 2015-10-15 – 2015-10-16 (×2): 81 mg via ORAL
  Filled 2015-10-15 (×2): qty 1

## 2015-10-15 MED ORDER — METOCLOPRAMIDE HCL 5 MG/ML IJ SOLN
10.0000 mg | Freq: Two times a day (BID) | INTRAMUSCULAR | Status: DC
  Administered 2015-10-15: 10 mg via INTRAVENOUS
  Filled 2015-10-15: qty 2

## 2015-10-15 MED ORDER — PANTOPRAZOLE SODIUM 40 MG PO TBEC
40.0000 mg | DELAYED_RELEASE_TABLET | Freq: Two times a day (BID) | ORAL | Status: DC
  Administered 2015-10-15 – 2015-10-16 (×3): 40 mg via ORAL
  Filled 2015-10-15 (×3): qty 1

## 2015-10-15 MED ORDER — PROMETHAZINE HCL 25 MG/ML IJ SOLN: 12.5000 mg | Freq: Four times a day (QID) | INTRAMUSCULAR | Status: DC | PRN

## 2015-10-15 MED ORDER — LEVOTHYROXINE SODIUM 75 MCG PO TABS
75.0000 ug | ORAL_TABLET | Freq: Every day | ORAL | Status: DC
  Administered 2015-10-15 – 2015-10-16 (×2): 75 ug via ORAL
  Filled 2015-10-15 (×2): qty 1

## 2015-10-15 MED ORDER — METOPROLOL TARTRATE 25 MG PO TABS
25.0000 mg | ORAL_TABLET | Freq: Two times a day (BID) | ORAL | Status: DC
  Administered 2015-10-15 – 2015-10-16 (×3): 25 mg via ORAL
  Filled 2015-10-15 (×4): qty 1

## 2015-10-15 MED ORDER — INSULIN ASPART 100 UNIT/ML ~~LOC~~ SOLN
0.0000 [IU] | Freq: Four times a day (QID) | SUBCUTANEOUS | Status: DC
  Administered 2015-10-15: 5 [IU] via SUBCUTANEOUS
  Administered 2015-10-15: 3 [IU] via SUBCUTANEOUS
  Administered 2015-10-15: 2 [IU] via SUBCUTANEOUS
  Administered 2015-10-16: 3 [IU] via SUBCUTANEOUS
  Administered 2015-10-16: 1 [IU] via SUBCUTANEOUS
  Filled 2015-10-15: qty 1
  Filled 2015-10-15 (×2): qty 3
  Filled 2015-10-15: qty 2
  Filled 2015-10-15: qty 5
  Filled 2015-10-15: qty 1

## 2015-10-15 MED ORDER — DICYCLOMINE HCL 10 MG PO CAPS
10.0000 mg | ORAL_CAPSULE | Freq: Three times a day (TID) | ORAL | Status: DC
  Administered 2015-10-15 – 2015-10-16 (×6): 10 mg via ORAL
  Filled 2015-10-15 (×6): qty 1

## 2015-10-15 MED ORDER — ONDANSETRON HCL 4 MG/2ML IJ SOLN
4.0000 mg | Freq: Four times a day (QID) | INTRAMUSCULAR | Status: DC | PRN
  Administered 2015-10-15 – 2015-10-16 (×3): 4 mg via INTRAVENOUS
  Filled 2015-10-15 (×3): qty 2

## 2015-10-15 MED ORDER — ONDANSETRON HCL 4 MG PO TABS
4.0000 mg | ORAL_TABLET | Freq: Four times a day (QID) | ORAL | Status: DC | PRN
  Filled 2015-10-15: qty 1

## 2015-10-15 MED ORDER — MORPHINE SULFATE (PF) 4 MG/ML IV SOLN
4.0000 mg | INTRAVENOUS | Status: DC | PRN
  Administered 2015-10-15 – 2015-10-16 (×8): 4 mg via INTRAVENOUS
  Filled 2015-10-15 (×8): qty 1

## 2015-10-15 MED ORDER — AMLODIPINE BESYLATE 5 MG PO TABS
5.0000 mg | ORAL_TABLET | Freq: Every day | ORAL | Status: DC
  Administered 2015-10-15 – 2015-10-16 (×2): 5 mg via ORAL
  Filled 2015-10-15 (×2): qty 1

## 2015-10-15 MED ORDER — METOCLOPRAMIDE HCL 5 MG PO TABS
5.0000 mg | ORAL_TABLET | Freq: Three times a day (TID) | ORAL | Status: DC
  Administered 2015-10-15 – 2015-10-16 (×4): 5 mg via ORAL
  Filled 2015-10-15 (×5): qty 1

## 2015-10-15 MED ORDER — APIXABAN 5 MG PO TABS
5.0000 mg | ORAL_TABLET | Freq: Two times a day (BID) | ORAL | Status: DC
  Administered 2015-10-15 – 2015-10-16 (×3): 5 mg via ORAL
  Filled 2015-10-15 (×4): qty 1

## 2015-10-15 NOTE — ED Notes (Signed)
Report called to [employer], states not able to take pt to room till systolic < 353.  Meds given per md order.

## 2015-10-15 NOTE — ED Provider Notes (Addendum)
[facility] Emergency Department Provider Note  ____________________________________________  Time seen: Approximately 5pM  I have reviewed the triage vital signs and the nursing notes.   HISTORY  Chief Complaint Abdominal Pain    HPI [name] is a 60 y.o. female recent history of a possible TIA versus a stroke. Also diabetes and pancreatitis.  Patient presents today states having abdominal pain since the time being discharged from [facility]. She reports that she is not able to keep food down, she is very nauseated, and has pain over the left side of the abdomen.  No fevers or chills. No chest pain or trouble breathing. She denies any recurrence of the weakness or numbness she experienced with her "mini stroke".  Patient reports frequent diarrhea.  Past Medical History  Diagnosis Date  . Type 1 diabetes ([place])   . Hypothyroid   . Degenerative disk disease   . Stomach ulcer   . Diverticulitis   . Syncope 01/2015  . Anxiety   . GERD (gastroesophageal reflux disease)   . History of hiatal hernia   . Cancer (HCC)     HX OF CANCER OF UTERUS   . TIA (transient ischemic attack) 02/2015  . Hypertension   . Pancreatitis   . PAF (paroxysmal atrial fibrillation) ([place]) 03/2015    a. new onset 03/2015 in setting of intractable N/V; b. on Eliquis 5 mg bid; c. CHADSVASc 4 (DM, TIA x 2, female)  . Intussusception intestine ([place]) 05/2015  . Stroke [facility])     Patient Active Problem List   Diagnosis Date Noted  . Bradycardia with 41 - 50 beats per minute   . Thyroid activity decreased   . TIA (transient ischemic attack)   . Speech abnormality   . Left-sided weakness 10/04/2015  . Expressive aphasia 10/04/2015  . Hypotension 08/06/2015  . Ileus ([place]) 08/01/2015  . Ascites   . Encephalopathy, metabolic 16/06/9603  . Liver cirrhosis ([place]) 07/10/2015  . Paroxysmal atrial fibrillation ([place]) 07/10/2015  . C. difficile colitis 07/10/2015  . Generalized  weakness 07/10/2015  . GI (gastrointestinal bleed) 07/06/2015  . Intractable nausea and vomiting 07/04/2015  . Malnutrition of moderate degree 07/04/2015  . Chest pain   . Symptomatic bradycardia 05/27/2015  . Acute encephalopathy 05/27/2015  . Elevated troponin 05/27/2015  . Intussusception intestine ([place])   . Hypothyroidism   . Abdominal pain, chronic, epigastric   . Diarrhea 05/19/2015  . Chronic anemia 05/19/2015  . Thrombocytopenia ([place]) 05/19/2015  . Prolonged QT interval   . Hypomagnesemia   . Colitis   . Narcotic abuse   . Type 2 diabetes mellitus without complication ([place])   . Syncope 05/18/2015  . Demand ischemia ([place]) 04/06/2015  . Hypokalemia 04/06/2015  . Hyperlipidemia with target LDL less than 100 04/06/2015  . Protein-calorie malnutrition, severe ([place]) 04/05/2015  . CVA (cerebral infarction) 02/15/2015  . Dehydration 01/12/2015  . Essential hypertension 01/12/2015  . Orthostatic hypotension 01/12/2015  . Chronically on opiate therapy 01/12/2015  . Gastroparesis 01/12/2015  . DEPRESSION/ANXIETY 06/27/2007  . MYOFASCIAL PAIN SYNDROME 06/27/2007  . Chronic pain syndrome 03/28/2007  . GERD 03/27/2007  . DIVERTICULOSIS, COLON 03/27/2007  . LUMBAR DISC DISPLACEMENT 03/27/2007  . PROTEINURIA 03/27/2007  . UTERINE CANCER, HX OF 03/27/2007    Past Surgical History  Procedure Laterality Date  . Hernia repair    . Abdominal hysterectomy    . Cholecystectomy    . Esophagogastroduodenoscopy N/A 04/04/2015    Procedure: ESOPHAGOGASTRODUODENOSCOPY (EGD);  Surgeon: Eddie Dibbles  Johnell Comings, MD;  Location: [facility] ENDOSCOPY;  Service: Endoscopy;  Laterality: N/A;    Current Outpatient Rx  Name  Route  Sig  Dispense  Refill  . amLODipine (NORVASC) 5 MG tablet   Oral   Take 1 tablet (5 mg total) by mouth daily.   30 tablet   3   . apixaban (ELIQUIS) 5 MG TABS tablet   Oral   Take 1 tablet (5 mg total) by mouth 2 (two) times daily.   60 tablet   0   . aspirin EC 81 MG EC  tablet   Oral   Take 1 tablet (81 mg total) by mouth daily.   30 tablet   3   . dicyclomine (BENTYL) 10 MG capsule   Oral   Take 1 capsule by mouth 4 (four) times daily - after meals and at bedtime.          . feeding supplement, GLUCERNA SHAKE, (GLUCERNA SHAKE) LIQD   Oral   Take 237 mLs by mouth 3 (three) times daily after meals.   90 Can   3   . lactulose (CHRONULAC) 10 GM/15ML solution   Oral   Take 15-45 mLs (10-30 g total) by mouth 2 (two) times daily as needed (P).   240 mL   0   . levothyroxine (SYNTHROID, LEVOTHROID) 75 MCG tablet   Oral   Take 1 tablet (75 mcg total) by mouth daily before breakfast.   30 tablet   1   . metoprolol tartrate (LOPRESSOR) 25 MG tablet   Oral   Take 1 tablet (25 mg total) by mouth 2 (two) times daily.   60 tablet   3   . tiZANidine (ZANAFLEX) 4 MG tablet   Oral   Take 4 mg by mouth 3 (three) times daily as needed for muscle spasms.          Marland Kitchen zolpidem (AMBIEN) 10 MG tablet   Oral   Take 10 mg by mouth at bedtime. Reported on 10/04/2015         . atorvastatin (LIPITOR) 40 MG tablet   Oral   Take 1 tablet (40 mg total) by mouth daily at 6 PM. Patient not taking: Reported on 10/04/2015   30 tablet   0   . insulin detemir (LEVEMIR) 100 UNIT/ML injection   Subcutaneous   Inject 0.17 mLs (17 Units total) into the skin daily. Patient not taking: Reported on 10/04/2015   10 mL   11   . lisinopril (PRINIVIL,ZESTRIL) 10 MG tablet   Oral   Take 0.5 tablets (5 mg total) by mouth 2 (two) times daily. Patient not taking: Reported on 10/04/2015   60 tablet   6   . morphine (MS CONTIN) 60 MG 12 hr tablet   Oral   Take 1 tablet (60 mg total) by mouth 2 (two) times daily. Patient not taking: Reported on 10/04/2015   60 tablet   0   . ondansetron (ZOFRAN) 4 MG tablet   Oral   Take 1 tablet (4 mg total) by mouth daily as needed for nausea or vomiting. Patient not taking: Reported on 10/04/2015   20 tablet   0   .  oxyCODONE-acetaminophen (ROXICET) 5-325 MG tablet   Oral   Take 1 tablet by mouth every 6 (six) hours as needed. Patient not taking: Reported on 10/04/2015   15 tablet   0   . pantoprazole (PROTONIX) 40 MG tablet   Oral   Take 1  tablet (40 mg total) by mouth 2 (two) times daily. Patient not taking: Reported on 10/04/2015   60 tablet   0   . rifaximin (XIFAXAN) 550 MG TABS tablet   Oral   Take 1 tablet (550 mg total) by mouth 2 (two) times daily. Patient not taking: Reported on 10/04/2015   60 tablet   6     Allergies Erythromycin base; Prednisone; Rosiglitazone maleate; Codeine sulfate; and Tetanus-diphtheria toxoids td  Family History  Problem Relation Age of Onset  . Hypertension Mother   . CAD Sister   . Heart attack Sister     Deceased 2014-12-01  . CAD Brother     Social History Social History  Substance Use Topics  . Smoking status: Never Smoker   . Smokeless tobacco: Never Used  . Alcohol Use: No    Review of Systems Constitutional: No fever/chills Eyes: No visual changes. ENT: No sore throat. Cardiovascular: Denies chest pain. Respiratory: Denies shortness of breath. Gastrointestinal:   No constipation. Genitourinary: Negative for dysuria. Musculoskeletal: Negative for back pain. Skin: Negative for rash. Neurological: Negative for headaches, focal weakness or numbness.  10-point ROS otherwise negative.  ____________________________________________   PHYSICAL EXAM:  VITAL SIGNS: ED Triage Vitals  Enc Vitals Group     BP 10/14/15 1653 180/94 mmHg     Pulse Rate 10/14/15 1653 101     Resp 10/14/15 1653 17     Temp 10/14/15 1653 98.4 F (36.9 C)     Temp Source 10/14/15 1653 Oral     SpO2 10/14/15 1648 100 %     Weight --      Height --      Head Cir --      Peak Flow --      Pain Score 10/14/15 1654 10     Pain Loc --      Pain Edu? --      Excl. in [place]? --    Constitutional: Alert and oriented. Well appearing and in no acute  distress. Eyes: Conjunctivae are normal. PERRL. EOMI. Head: Atraumatic. Nose: No congestion/rhinnorhea. Mouth/Throat: Mucous membranes are moist.  Oropharynx non-erythematous. Neck: No stridor.   Cardiovascular: Normal rate, regular rhythm. Grossly normal heart sounds.  Good peripheral circulation. Respiratory: Normal respiratory effort.  No retractions. Lungs CTAB. Gastrointestinal: Soft and nontender to for moderate tenderness across the left upper quadrant and left lower abdomen without rebound or guarding. No distention.Musculoskeletal: No lower extremity tenderness nor edema.  No joint effusions. Neurologic:  Normal speech and language. No gross focal neurologic deficits are appreciated. Skin:  Skin is warm, dry and intact. No rash noted. Psychiatric: Mood and affect are normal. Speech and behavior are normal.  ____________________________________________   LABS (all labs ordered are listed, but only abnormal results are displayed)  Labs Reviewed  COMPREHENSIVE METABOLIC PANEL - Abnormal; Notable for the following:    CO2 21 (*)    Glucose, Bld 219 (*)    Calcium 8.7 (*)    All other components within normal limits  CBC - Abnormal; Notable for the following:    Hemoglobin 11.5 (*)    RDW 17.2 (*)    Platelets 105 (*)    All other components within normal limits  URINALYSIS COMPLETEWITH MICROSCOPIC ([facility] ONLY) - Abnormal; Notable for the following:    Color, Urine YELLOW (*)    APPearance CLEAR (*)    Glucose, UA >500 (*)    Ketones, ur 1+ (*)    Specific Gravity, Urine 1.042 (*)  Hgb urine dipstick 1+ (*)    Squamous Epithelial / LPF 0-5 (*)    All other components within normal limits  GASTROINTESTINAL PANEL BY PCR, STOOL (REPLACES STOOL CULTURE)  C DIFFICILE QUICK SCREEN W PCR REFLEX  LIPASE, BLOOD  TROPONIN I   ____________________________________________  EKG  Reviewed and interpreted by me at 1700 hrs. Sinus tachycardia Ventricular rate 105 PR 190 QRS  90 QTC 44 Probable old anterior MI. No evidence of acute ischemic abnormality. ____________________________________________  RADIOLOGY   CT Abdomen Pelvis W Contrast (Final result) Result time: 10/14/15 19:44:13   Final result by Rad Results In Interface (10/14/15 19:44:13)   Narrative:   CLINICAL DATA: Lumbar spine degenerative changes.  EXAM: CT ABDOMEN AND PELVIS WITH CONTRAST  TECHNIQUE: Multidetector CT imaging of the abdomen and pelvis was performed using the standard protocol following bolus administration of intravenous contrast.  CONTRAST: 155m OMNIPAQUE IOHEXOL 300 MG/ML SOLN  COMPARISON: CT abdomen pelvis 08/14/2015  FINDINGS: Lower chest: Normal heart size. Dependent atelectasis within the bilateral lower lobes. No pleural effusion.  Hepatobiliary: Liver is small and nodular in contour compatible with cirrhosis. Patient status post cholecystectomy. No intrahepatic or extrahepatic biliary ductal dilatation.  Pancreas: Unremarkable  Spleen: Mild splenomegaly measuring 13 cm.  Adrenals/Urinary Tract: The adrenal glands are normal. Kidneys enhance symmetrically with contrast. There is a 9 mm nonobstructing stone within the inferior pole of the right kidney. 2 mm nonobstructing stone interpolar region left kidney (image 114; series 6). Bilateral too small to characterize low-attenuation renal lesions. No hydronephrosis. The urinary bladder is unremarkable.  Stomach/Bowel: Descending colon is decompressed, limiting evaluation however there is suggestion of wall thickening of the descending and sigmoid colon. No abnormal bowel wall thickening or evidence for bowel obstruction. No free intraperitoneal air. Small hiatal hernia. Normal morphology to the stomach.  Vascular/Lymphatic: Normal caliber abdominal aorta. No retroperitoneal lymphadenopathy.  Other: Patient status post hysterectomy. There is a moderate amount of ascites throughout the abdomen.  Cannot exclude a small amount of peritoneal enhancement, particularly within the pelvis (image 82; series 2).  Musculoskeletal: No aggressive or acute appearing osseous lesions.  IMPRESSION: Suggestion of wall thickening of the descending and sigmoid colon which is nonspecific and may partially be secondary to decompressed state. Colitis is not excluded. Recommend clinical evaluation.  Morphologic changes to the liver compatible with cirrhosis. Moderate volume ascites.  Possible mild peritoneal enhancement, particularly within the lower abdomen and pelvis. Peritonitis is not excluded.  Mild splenomegaly.  Bilateral nonobstructing nephrolithiasis.   Electronically Signed By: DLovey NewcomerM.D. On: 10/14/2015 19:44    ____________________________________________   PROCEDURES  Procedure(s) performed: None  Critical Care performed: No  ____________________________________________   INITIAL IMPRESSION / ASSESSMENT AND PLAN / ED COURSE  Pertinent labs & imaging results that were available during my care of the patient were reviewed by me and considered in my medical decision making (see chart for details).  Patient with left-sided abdominal pain. On history of multiple medical problems. We'll obtain CT imaging. Infectious colitis is considered given recent hospitalization. Patient is awake and alert in no distress, but after multiple rounds of pain medicine and antiemetic she continues to note pain and inability to tolerate by mouth that she was able to drink barium for her CT scan.  I doubt acute process such as perforation or rupture aneurysm, however they do find the possibility of colitis of etiology such as infectious, ischemic to be a possibility. Given the patient's ongoing symptomatology despite multiple rounds of treatment ____________________________________________  FINAL CLINICAL IMPRESSION(S) / ED DIAGNOSES  Final diagnoses:  Intractable abdominal pain       Delman Kitten, MD 10/15/15 0277  Delman Kitten, MD 10/30/15 [id number]

## 2015-10-15 NOTE — Care Management Obs Status (Signed)
[facility] NOTIFICATION   Patient Details  Name: [name] MRN: [id number] Date of Birth: [birth year]   Medicare Observation Status Notification Given:  Yes (On contact isolation, has form but did not sign because it could not be brought out of her room. )    Aailyah Dunbar A, RN 10/15/2015, 4:14 PM

## 2015-10-15 NOTE — Progress Notes (Signed)
[facility] [place] NAME: [name]    MR#:  [id number]  DATE OF BIRTH:  May 24, 1956  SUBJECTIVE:  CHIEF COMPLAINT:   Chief Complaint  Patient presents with  . Abdominal Pain     Vomiting, abdominal pain and diarrhea for last 4 days.   She said- she was treated for C diff in past 3-4 mths during admission here.  REVIEW OF SYSTEMS:  CONSTITUTIONAL: No fever, fatigue or weakness.  EYES: No blurred or double vision.  EARS, NOSE, AND THROAT: No tinnitus or ear pain.  RESPIRATORY: No cough, shortness of breath, wheezing or hemoptysis.  CARDIOVASCULAR: No chest pain, orthopnea, edema.  GASTROINTESTINAL: positive nausea, vomiting, diarrhea and abdominal pain.  GENITOURINARY: No dysuria, hematuria.  ENDOCRINE: No polyuria, nocturia,  HEMATOLOGY: No anemia, easy bruising or bleeding SKIN: No rash or lesion. MUSCULOSKELETAL: No joint pain or arthritis.   NEUROLOGIC: No tingling, numbness, weakness.  PSYCHIATRY: No anxiety or depression.   ROS  DRUG ALLERGIES:   Allergies  Allergen Reactions  . Erythromycin Base Other (See Comments)    Reaction:  Fever   . Prednisone     unknown  . Rosiglitazone Maleate Swelling  . Codeine Sulfate Rash  . Tetanus-Diphtheria Toxoids Td Rash and Other (See Comments)    Reaction:  Fever     VITALS:  Blood pressure 126/57, pulse 126, temperature 98 F (36.7 C), temperature source Oral, resp. rate 20, SpO2 99 %.  PHYSICAL EXAMINATION:  GENERAL:  60 y.o.-year-old patient lying in the bed with no acute distress.  EYES: Pupils equal, round, reactive to light and accommodation. No scleral icterus. Extraocular muscles intact.  HEENT: Head atraumatic, normocephalic. Oropharynx and nasopharynx clear.  NECK:  Supple, no jugular venous distention. No thyroid enlargement, no tenderness.  LUNGS: Normal breath sounds bilaterally, no wheezing, rales,rhonchi or crepitation. No use of accessory muscles of  respiration.  CARDIOVASCULAR: S1, S2 normal. No murmurs, rubs, or gallops.  ABDOMEN: Soft, nontender, nondistended. Bowel sounds present. No organomegaly or mass.  EXTREMITIES: No pedal edema, cyanosis, or clubbing.  NEUROLOGIC: Cranial nerves II through XII are intact. Muscle strength 5/5 in all extremities. Sensation intact. Gait not checked.  PSYCHIATRIC: The patient is alert and oriented x 3.  SKIN: No obvious rash, lesion, or ulcer.   Physical Exam LABORATORY PANEL:   CBC  Recent Labs Lab 10/15/15 0547  WBC 10.4  HGB 11.7*  HCT 35.7  PLT 134*   ------------------------------------------------------------------------------------------------------------------  Chemistries   Recent Labs Lab 10/14/15 1658 10/15/15 0547  NA 139 139  K 3.5 3.9  CL 108 106  CO2 21* 22  GLUCOSE 219* 311*  BUN 15 17  CREATININE 0.60 0.66  CALCIUM 8.7* 8.5*  AST 34  --   ALT 30  --   ALKPHOS 95  --   BILITOT 1.0  --    ------------------------------------------------------------------------------------------------------------------  Cardiac Enzymes No results for input(s): TROPONINI in the last 168 hours. ------------------------------------------------------------------------------------------------------------------  RADIOLOGY:  Ct Abdomen Pelvis W Contrast  10/14/2015  CLINICAL DATA:  Lumbar spine degenerative changes. EXAM: CT ABDOMEN AND PELVIS WITH CONTRAST TECHNIQUE: Multidetector CT imaging of the abdomen and pelvis was performed using the standard protocol following bolus administration of intravenous contrast. CONTRAST:  146m OMNIPAQUE IOHEXOL 300 MG/ML  SOLN COMPARISON:  CT abdomen pelvis 08/14/2015 FINDINGS: Lower chest: Normal heart size. Dependent atelectasis within the bilateral lower lobes. No pleural effusion. Hepatobiliary: Liver is small and nodular in contour compatible with cirrhosis. Patient  status post cholecystectomy. No intrahepatic or extrahepatic biliary ductal  dilatation. Pancreas: Unremarkable Spleen: Mild splenomegaly measuring 13 cm. Adrenals/Urinary Tract: The adrenal glands are normal. Kidneys enhance symmetrically with contrast. There is a 9 mm nonobstructing stone within the inferior pole of the right kidney. 2 mm nonobstructing stone interpolar region left kidney (image 114; series 6). Bilateral too small to characterize low-attenuation renal lesions. No hydronephrosis. The urinary bladder is unremarkable. Stomach/Bowel: Descending colon is decompressed, limiting evaluation however there is suggestion of wall thickening of the descending and sigmoid colon. No abnormal bowel wall thickening or evidence for bowel obstruction. No free intraperitoneal air. Small hiatal hernia. Normal morphology to the stomach. Vascular/Lymphatic: Normal caliber abdominal aorta. No retroperitoneal lymphadenopathy. Other: Patient status post hysterectomy. There is a moderate amount of ascites throughout the abdomen. Cannot exclude a small amount of peritoneal enhancement, particularly within the pelvis (image 82; series 2). Musculoskeletal: No aggressive or acute appearing osseous lesions. IMPRESSION: Suggestion of wall thickening of the descending and sigmoid colon which is nonspecific and may partially be secondary to decompressed state. Colitis is not excluded. Recommend clinical evaluation. Morphologic changes to the liver compatible with cirrhosis. Moderate volume ascites. Possible mild peritoneal enhancement, particularly within the lower abdomen and pelvis. Peritonitis is not excluded. Mild splenomegaly. Bilateral nonobstructing nephrolithiasis. Electronically Signed   By: Lovey Newcomer M.D.   On: 10/14/2015 19:44    ASSESSMENT AND PLAN:   Principal Problem:   Intractable nausea and vomiting Active Problems:   GERD   Essential hypertension   Gastroparesis   Type 2 diabetes mellitus without complication (HCC)   Hypothyroidism   Liver cirrhosis (HCC)   Paroxysmal atrial  fibrillation (HCC)  * Intractable Nausea, VOmiting and diarrhea   CT suggest colitis.   No fever.   Hx of Multiple admission to hospital with Having C diff Positive Ag but negative toxin in Oct 2016- treated with Flagyl.   Currnently will treat supportive, until stool sample confirms infection.   PRN nausea and pain meds.   Received One dose loperamide by ER yesterday. No Diarrhea since in room here.   No need for other Abx,a s no fever or high WBCs.  * Gastroperesis    Reglan as anti motility agent.  * Essential hypertension - home meds - stable.  * Type 2 diabetes mellitus without complication (HCC) - sliding scale insulin with corresponding glucose checks every 6 hours   * Paroxysmal atrial fibrillation (HCC) - currently rate controlled, continue rate controlling medications, continue home dose anticoagulant  * GERD - home dose PPI  * Hypothyroidism - home dose thyroid replacement  * Liver cirrhosis ([place]) - avoid Tylenol, avoid other hepatotoxins, monitor  All the records are reviewed and case discussed with Care Management/Social Workerr. Management plans discussed with the patient, family and they are in agreement.  CODE STATUS: Full  TOTAL TIME TAKING CARE OF THIS PATIENT: 35 minutes.   POSSIBLE D/C IN 1-2 DAYS, DEPENDING ON CLINICAL CONDITION.   Vaughan Basta M.D on 10/15/2015   Between 7am to 6pm - Pager - [phone]  After 6pm go to [email or web address] - password [email or web address] [facility]  Office  [phone]  CC: Primary care physician; [place] Pink, MD  Note: This dictation was prepared with Dragon dictation along with smaller phrase technology. Any transcriptional errors that result from this process are unintentional.

## 2015-10-16 DIAGNOSIS — K3184 Gastroparesis: Secondary | ICD-10-CM | POA: Diagnosis not present

## 2015-10-16 DIAGNOSIS — I10 Essential (primary) hypertension: Secondary | ICD-10-CM | POA: Diagnosis not present

## 2015-10-16 DIAGNOSIS — E119 Type 2 diabetes mellitus without complications: Secondary | ICD-10-CM | POA: Diagnosis not present

## 2015-10-16 DIAGNOSIS — R112 Nausea with vomiting, unspecified: Secondary | ICD-10-CM | POA: Diagnosis not present

## 2015-10-16 LAB — GLUCOSE, CAPILLARY
GLUCOSE-CAPILLARY: 223 mg/dL — AB (ref 65–99)
Glucose-Capillary: 142 mg/dL — ABNORMAL HIGH (ref 65–99)

## 2015-10-16 MED ORDER — MORPHINE SULFATE ER 30 MG PO TBCR: 30.0000 mg | EXTENDED_RELEASE_TABLET | Freq: Two times a day (BID) | ORAL | Status: DC

## 2015-10-16 MED ORDER — METOCLOPRAMIDE HCL 5 MG PO TABS
5.0000 mg | ORAL_TABLET | Freq: Three times a day (TID) | ORAL | Status: DC
Start: 2015-10-16 — End: 2015-11-16

## 2015-10-16 MED ORDER — INSULIN DETEMIR 100 UNIT/ML ~~LOC~~ SOLN: 10.0000 [IU] | Freq: Every day | SUBCUTANEOUS | Status: DC

## 2015-10-16 NOTE — Progress Notes (Signed)
Pt d/c home; d/c instructions reviewed w/ pt; pt understanding was verbalized; IVs removed catheters in tact, gauze dressings applied; all pt questions answered; pt left unit via wheelchair accompanied by staff

## 2015-10-16 NOTE — Discharge Summary (Signed)
[facility] NAME: [name]    MR#:  [id number]  DATE OF BIRTH:  1956-05-19  DATE OF ADMISSION:  10/14/2015 ADMITTING PHYSICIAN: Lance Coon, MD  DATE OF DISCHARGE: 10/16/2015  PRIMARY CARE PHYSICIAN: Maryland Pink, MD    ADMISSION DIAGNOSIS:  Intractable abdominal pain [R10.9]  DISCHARGE DIAGNOSIS:  Principal Problem:   Intractable nausea and vomiting- likely viral gastroenteritis. Active Problems:   GERD   Essential hypertension   Gastroparesis   Type 2 diabetes mellitus without complication (HCC)   Hypothyroidism   Liver cirrhosis (HCC)   Paroxysmal atrial fibrillation (HCC)   SECONDARY DIAGNOSIS:   Past Medical History  Diagnosis Date  . Type 1 diabetes ([place])   . Hypothyroid   . Degenerative disk disease   . Stomach ulcer   . Diverticulitis   . Syncope 01/2015  . Anxiety   . GERD (gastroesophageal reflux disease)   . History of hiatal hernia   . Cancer (HCC)     HX OF CANCER OF UTERUS   . TIA (transient ischemic attack) 02/2015  . Hypertension   . Pancreatitis   . PAF (paroxysmal atrial fibrillation) ([place]) 03/2015    a. new onset 03/2015 in setting of intractable N/V; b. on Eliquis 5 mg bid; c. CHADSVASc 4 (DM, TIA x 2, female)  . Intussusception intestine ([place]) 05/2015  . Stroke [facility])     HOSPITAL COURSE:   * Intractable Nausea, VOmiting and diarrhea  CT suggest colitis.  No fever.  Hx of Multiple admission to hospital with Having C diff Positive Ag but negative toxin in Oct 2016- treated with Flagyl.  Currnently will treat supportive,     PRN nausea and pain meds.  Received One dose loperamide by ER - and next 40 hrs stayed without any diarrhea, her nausea is also stopped.    Tolerated diet well.  No need for other Abx,as no fever or high WBCs.  * Gastroperesis  Reglan as anti motility agent.  * Essential hypertension - home meds - stable.  * Type 2 diabetes mellitus without  complication (HCC) - sliding scale insulin with corresponding glucose checks every 6 hours   * Paroxysmal atrial fibrillation (HCC) - currently rate controlled, continue rate controlling medications, continue home dose anticoagulant  * GERD - home dose PPI  * Hypothyroidism - home dose thyroid replacement  * Liver cirrhosis ([place]) - avoid Tylenol, avoid other hepatotoxins, monitor  DISCHARGE CONDITIONS:   Stable.  CONSULTS OBTAINED:     DRUG ALLERGIES:   Allergies  Allergen Reactions  . Erythromycin Base Other (See Comments)    Reaction:  Fever   . Prednisone     unknown  . Rosiglitazone Maleate Swelling  . Codeine Sulfate Rash  . Tetanus-Diphtheria Toxoids Td Rash and Other (See Comments)    Reaction:  Fever     DISCHARGE MEDICATIONS:   Current Discharge Medication List    CONTINUE these medications which have CHANGED   Details  insulin detemir (LEVEMIR) 100 UNIT/ML injection Inject 0.1 mLs (10 Units total) into the skin daily. Qty: 10 mL, Refills: 11    morphine (MS CONTIN) 30 MG 12 hr tablet Take 1 tablet (30 mg total) by mouth 2 (two) times daily. Qty: 30 tablet, Refills: 0      CONTINUE these medications which have NOT CHANGED   Details  amLODipine (NORVASC) 5 MG tablet Take 1 tablet (5 mg total) by mouth daily. Qty: 30 tablet, Refills:  3    apixaban (ELIQUIS) 5 MG TABS tablet Take 1 tablet (5 mg total) by mouth 2 (two) times daily. Qty: 60 tablet, Refills: 0    aspirin EC 81 MG EC tablet Take 1 tablet (81 mg total) by mouth daily. Qty: 30 tablet, Refills: 3    dicyclomine (BENTYL) 10 MG capsule Take 1 capsule by mouth 4 (four) times daily - after meals and at bedtime.     feeding supplement, GLUCERNA SHAKE, (GLUCERNA SHAKE) LIQD Take 237 mLs by mouth 3 (three) times daily after meals. Qty: 90 Can, Refills: 3    lactulose (CHRONULAC) 10 GM/15ML solution Take 15-45 mLs (10-30 g total) by mouth 2 (two) times daily as needed (P). Qty: 240 mL, Refills:  0    levothyroxine (SYNTHROID, LEVOTHROID) 75 MCG tablet Take 1 tablet (75 mcg total) by mouth daily before breakfast. Qty: 30 tablet, Refills: 1    metoprolol tartrate (LOPRESSOR) 25 MG tablet Take 1 tablet (25 mg total) by mouth 2 (two) times daily. Qty: 60 tablet, Refills: 3    tiZANidine (ZANAFLEX) 4 MG tablet Take 4 mg by mouth 3 (three) times daily as needed for muscle spasms.     zolpidem (AMBIEN) 10 MG tablet Take 10 mg by mouth at bedtime. Reported on 10/04/2015    atorvastatin (LIPITOR) 40 MG tablet Take 1 tablet (40 mg total) by mouth daily at 6 PM. Qty: 30 tablet, Refills: 0    lisinopril (PRINIVIL,ZESTRIL) 10 MG tablet Take 0.5 tablets (5 mg total) by mouth 2 (two) times daily. Qty: 60 tablet, Refills: 6    ondansetron (ZOFRAN) 4 MG tablet Take 1 tablet (4 mg total) by mouth daily as needed for nausea or vomiting. Qty: 20 tablet, Refills: 0    oxyCODONE-acetaminophen (ROXICET) 5-325 MG tablet Take 1 tablet by mouth every 6 (six) hours as needed. Qty: 15 tablet, Refills: 0    pantoprazole (PROTONIX) 40 MG tablet Take 1 tablet (40 mg total) by mouth 2 (two) times daily. Qty: 60 tablet, Refills: 0    rifaximin (XIFAXAN) 550 MG TABS tablet Take 1 tablet (550 mg total) by mouth 2 (two) times daily. Qty: 60 tablet, Refills: 6         DISCHARGE INSTRUCTIONS:   Follow with PMD in 1 week.  If you experience worsening of your admission symptoms, develop shortness of breath, life threatening emergency, suicidal or homicidal thoughts you must seek medical attention immediately by calling 911 or calling your MD immediately  if symptoms less severe.  You Must read complete instructions/literature along with all the possible adverse reactions/side effects for all the Medicines you take and that have been prescribed to you. Take any new Medicines after you have completely understood and accept all the possible adverse reactions/side effects.   Please note  You were cared for  by a hospitalist during your hospital stay. If you have any questions about your discharge medications or the care you received while you were in the hospital after you are discharged, you can call the unit and asked to speak with the hospitalist on call if the hospitalist that took care of you is not available. Once you are discharged, your primary care physician will handle any further medical issues. Please note that NO REFILLS for any discharge medications will be authorized once you are discharged, as it is imperative that you return to your primary care physician (or establish a relationship with a primary care physician if you do not have one) for your  aftercare needs so that they can reassess your need for medications and monitor your lab values.    Today   CHIEF COMPLAINT:   Chief Complaint  Patient presents with  . Abdominal Pain    HISTORY OF PRESENT ILLNESS:  [name]  is a 60 y.o. female with abdominal pain with nausea and vomiting for 4 days. Patient states that she came to the ED today because she was unable to control her vomiting. She has a history of multiple hospitalizations with nausea vomiting abdominal pain. She has a history of cirrhosis with ascites. She also has a history of gastroparesis. Initial workup in the ED largely benign. CT scan did show an area which the radiologist questioned as possible colitis. Patient does state she's been having some diarrhea during this time as well. She denies any fever/chills/dysuria/other infectious symptoms. After multiple rounds of antiemetics in the ED with minimal effect, hospitalists were called for admission   VITAL SIGNS:  Blood pressure 131/58, pulse 86, temperature 98.3 F (36.8 C), temperature source Oral, resp. rate 17, height 5' 3"  (1.6 m), weight 59.421 kg (131 lb), SpO2 97 %.  I/O:   Intake/Output Summary (Last 24 hours) at 10/16/15 0957 Last data filed at 10/16/15 0630  Gross per 24 hour  Intake    180 ml   Output   2000 ml  Net  -1820 ml    PHYSICAL EXAMINATION:   GENERAL: 60 y.o.-year-old patient lying in the bed with no acute distress.  EYES: Pupils equal, round, reactive to light and accommodation. No scleral icterus. Extraocular muscles intact.  HEENT: Head atraumatic, normocephalic. Oropharynx and nasopharynx clear.  NECK: Supple, no jugular venous distention. No thyroid enlargement, no tenderness.  LUNGS: Normal breath sounds bilaterally, no wheezing, rales,rhonchi or crepitation. No use of accessory muscles of respiration.  CARDIOVASCULAR: S1, S2 normal. No murmurs, rubs, or gallops.  ABDOMEN: Soft, nontender, nondistended. Bowel sounds present. No organomegaly or mass.  EXTREMITIES: No pedal edema, cyanosis, or clubbing.  NEUROLOGIC: Cranial nerves II through XII are intact. Muscle strength 5/5 in all extremities. Sensation intact. Gait not checked.  PSYCHIATRIC: The patient is alert and oriented x 3.  SKIN: No obvious rash, lesion, or ulcer.   DATA REVIEW:   CBC  Recent Labs Lab 10/15/15 0547  WBC 10.4  HGB 11.7*  HCT 35.7  PLT 134*    Chemistries   Recent Labs Lab 10/14/15 1658 10/15/15 0547  NA 139 139  K 3.5 3.9  CL 108 106  CO2 21* 22  GLUCOSE 219* 311*  BUN 15 17  CREATININE 0.60 0.66  CALCIUM 8.7* 8.5*  AST 34  --   ALT 30  --   ALKPHOS 95  --   BILITOT 1.0  --     Cardiac Enzymes No results for input(s): TROPONINI in the last 168 hours.  Microbiology Results  Results for orders placed or performed during the hospital encounter of 07/31/15  Body fluid culture     Status: None   Collection Time: 08/01/15  9:18 AM  Result Value Ref Range Status   Specimen Description PERITONEAL  Final   Special Requests NONE  Final   Gram Stain FEW WBC SEEN NO ORGANISMS SEEN   Final   Culture No growth aerobically or anaerobically.  Final   Report Status 08/05/2015 FINAL  Final    RADIOLOGY:  Ct Abdomen Pelvis W Contrast  10/14/2015   CLINICAL DATA:  Lumbar spine degenerative changes. EXAM: CT ABDOMEN AND PELVIS WITH  CONTRAST TECHNIQUE: Multidetector CT imaging of the abdomen and pelvis was performed using the standard protocol following bolus administration of intravenous contrast. CONTRAST:  170m OMNIPAQUE IOHEXOL 300 MG/ML  SOLN COMPARISON:  CT abdomen pelvis 08/14/2015 FINDINGS: Lower chest: Normal heart size. Dependent atelectasis within the bilateral lower lobes. No pleural effusion. Hepatobiliary: Liver is small and nodular in contour compatible with cirrhosis. Patient status post cholecystectomy. No intrahepatic or extrahepatic biliary ductal dilatation. Pancreas: Unremarkable Spleen: Mild splenomegaly measuring 13 cm. Adrenals/Urinary Tract: The adrenal glands are normal. Kidneys enhance symmetrically with contrast. There is a 9 mm nonobstructing stone within the inferior pole of the right kidney. 2 mm nonobstructing stone interpolar region left kidney (image 114; series 6). Bilateral too small to characterize low-attenuation renal lesions. No hydronephrosis. The urinary bladder is unremarkable. Stomach/Bowel: Descending colon is decompressed, limiting evaluation however there is suggestion of wall thickening of the descending and sigmoid colon. No abnormal bowel wall thickening or evidence for bowel obstruction. No free intraperitoneal air. Small hiatal hernia. Normal morphology to the stomach. Vascular/Lymphatic: Normal caliber abdominal aorta. No retroperitoneal lymphadenopathy. Other: Patient status post hysterectomy. There is a moderate amount of ascites throughout the abdomen. Cannot exclude a small amount of peritoneal enhancement, particularly within the pelvis (image 82; series 2). Musculoskeletal: No aggressive or acute appearing osseous lesions. IMPRESSION: Suggestion of wall thickening of the descending and sigmoid colon which is nonspecific and may partially be secondary to decompressed state. Colitis is not excluded.  Recommend clinical evaluation. Morphologic changes to the liver compatible with cirrhosis. Moderate volume ascites. Possible mild peritoneal enhancement, particularly within the lower abdomen and pelvis. Peritonitis is not excluded. Mild splenomegaly. Bilateral nonobstructing nephrolithiasis. Electronically Signed   By: DLovey NewcomerM.D.   On: 10/14/2015 19:44     Management plans discussed with the patient, family and they are in agreement.  CODE STATUS:     Code Status Orders        Start     Ordered   10/15/15 0124  Full code   Continuous     10/15/15 0123    Code Status History    Date Active Date Inactive Code Status Order ID Comments User Context   10/04/2015  8:19 PM 10/08/2015  6:34 PM Full Code [id number] EBethena Roys MD Inpatient   08/15/2015  1:05 AM 08/16/2015  8:04 PM Full Code [id number] MHarrie Foreman MD Inpatient   08/01/2015  1:25 AM 08/06/2015  5:41 PM Full Code [id number] MHarrie Foreman MD Inpatient   07/04/2015  2:53 AM 07/11/2015  6:33 PM Full Code [id number] DLytle Butte MD ED   05/27/2015 10:48 PM 05/31/2015  2:37 PM Full Code [id number] ARise Patience MD Inpatient   05/19/2015  1:14 AM 05/22/2015  4:28 PM Full Code [id number] ARise Patience MD Inpatient   04/05/2015  9:16 AM 04/07/2015  5:23 PM Full Code [id number] SBettey Costa MD Inpatient   04/03/2015 12:31 PM 04/05/2015  9:16 AM Full Code [id number] SBettey Costa MD Inpatient   03/11/2015  4:49 PM 03/14/2015  3:50 PM Full Code [id number] SDustin Flock MD Inpatient   02/15/2015  4:51 PM 02/17/2015  2:33 PM DNR [id number] SDustin Flock MD Inpatient   02/15/2015  4:35 PM 02/15/2015  4:51 PM Full Code [id number] SDustin Flock MD Inpatient   01/11/2015  2:40 AM 01/12/2015  6:11 PM Full Code [id number] JEtta Quill DO ED    Advance  Directive Documentation        Most Recent Value   Type of Advance Directive  Healthcare Power of Attorney, Living will   Pre-existing out of facility DNR order (yellow form  or pink MOST form)     "MOST" Form in Place?        TOTAL TIME TAKING CARE OF THIS PATIENT: 35 minutes.    Vaughan Basta M.D on 10/16/2015 at 9:57 AM  Between 7am to 6pm - Pager - [phone]  After 6pm go to [email or web address] - password [email or web address] [facility]  Office  [phone]  CC: Primary care physician; [place] Pink, MD   Note: This dictation was prepared with Dragon dictation along with smaller phrase technology. Any transcriptional errors that result from this process are unintentional.

## 2015-10-16 NOTE — Discharge Instructions (Signed)
Follow all MD discharge instructions. Take all medications as prescribed. Keep all follow up appointments. If your symptoms return, call your doctor. If you experience any new symptoms that are of concern to you or that are bothersome to you, call your doctor. For all questions and/or concerns, call your doctor.   If you have a medical emergency, call 911

## 2015-10-16 NOTE — Care Management Note (Signed)
Case Management Note  Patient Details  Name: [name] MRN: [id number] Date of Birth: 08-28-1956  Subjective/Objective:    A referral was called and faxed to [facility] requesting a RN, PT, Nurse aid.               Action/Plan:   Expected Discharge Date:                  Expected Discharge Plan:     In-House Referral:     Discharge planning Services     Post Acute Care Choice:    Choice offered to:     DME Arranged:    DME Agency:     HH Arranged:    [facility] Agency:     Status of Service:     Medicare Important Message Given:    Date Medicare IM Given:    Medicare IM give by:    Date Additional Medicare IM Given:    Additional Medicare Important Message give by:     If discussed at [place] of Stay Meetings, dates discussed:    Additional Comments:  Goku Harb A, RN 10/16/2015, 3:06 PM

## 2015-10-18 ENCOUNTER — Other Ambulatory Visit: Payer: Self-pay | Admitting: *Deleted

## 2015-10-18 NOTE — Patient Outreach (Signed)
Attempt made to contact pt for transition of care (discharged 2/5).  Unable to leave a voice message as phone kept ringing, no voice message.   Several attempts made to contact pt from previous hospital discharge 1/28, no response.  Will try again.       Zara Chess.   [facility]  [phone]

## 2015-10-21 ENCOUNTER — Encounter: Payer: Self-pay | Admitting: Anesthesiology

## 2015-10-21 ENCOUNTER — Other Ambulatory Visit: Payer: Self-pay | Admitting: *Deleted

## 2015-10-21 ENCOUNTER — Ambulatory Visit: Payer: Commercial Managed Care - HMO | Attending: Anesthesiology | Admitting: Anesthesiology

## 2015-10-21 ENCOUNTER — Other Ambulatory Visit: Payer: Self-pay | Admitting: Anesthesiology

## 2015-10-21 VITALS — BP 182/69 | HR 108 | Temp 98.2°F | Resp 16 | Ht 63.0 in | Wt 133.0 lb

## 2015-10-21 DIAGNOSIS — E109 Type 1 diabetes mellitus without complications: Secondary | ICD-10-CM | POA: Diagnosis not present

## 2015-10-21 DIAGNOSIS — Z8673 Personal history of transient ischemic attack (TIA), and cerebral infarction without residual deficits: Secondary | ICD-10-CM | POA: Insufficient documentation

## 2015-10-21 DIAGNOSIS — M545 Low back pain, unspecified: Secondary | ICD-10-CM

## 2015-10-21 DIAGNOSIS — K219 Gastro-esophageal reflux disease without esophagitis: Secondary | ICD-10-CM | POA: Diagnosis not present

## 2015-10-21 DIAGNOSIS — M5441 Lumbago with sciatica, right side: Secondary | ICD-10-CM | POA: Diagnosis not present

## 2015-10-21 DIAGNOSIS — I10 Essential (primary) hypertension: Secondary | ICD-10-CM | POA: Diagnosis not present

## 2015-10-21 DIAGNOSIS — M5116 Intervertebral disc disorders with radiculopathy, lumbar region: Secondary | ICD-10-CM | POA: Diagnosis not present

## 2015-10-21 DIAGNOSIS — G8929 Other chronic pain: Secondary | ICD-10-CM | POA: Insufficient documentation

## 2015-10-21 DIAGNOSIS — Z79891 Long term (current) use of opiate analgesic: Secondary | ICD-10-CM | POA: Diagnosis not present

## 2015-10-21 DIAGNOSIS — M5442 Lumbago with sciatica, left side: Secondary | ICD-10-CM | POA: Diagnosis not present

## 2015-10-21 DIAGNOSIS — I4891 Unspecified atrial fibrillation: Secondary | ICD-10-CM | POA: Insufficient documentation

## 2015-10-21 DIAGNOSIS — I4819 Other persistent atrial fibrillation: Secondary | ICD-10-CM

## 2015-10-21 DIAGNOSIS — M5416 Radiculopathy, lumbar region: Secondary | ICD-10-CM | POA: Diagnosis not present

## 2015-10-21 DIAGNOSIS — Z79899 Other long term (current) drug therapy: Secondary | ICD-10-CM | POA: Diagnosis not present

## 2015-10-21 DIAGNOSIS — M5136 Other intervertebral disc degeneration, lumbar region: Secondary | ICD-10-CM | POA: Diagnosis not present

## 2015-10-21 DIAGNOSIS — Z5181 Encounter for therapeutic drug level monitoring: Secondary | ICD-10-CM | POA: Diagnosis not present

## 2015-10-21 DIAGNOSIS — Z9889 Other specified postprocedural states: Secondary | ICD-10-CM | POA: Insufficient documentation

## 2015-10-21 DIAGNOSIS — F1190 Opioid use, unspecified, uncomplicated: Secondary | ICD-10-CM | POA: Diagnosis not present

## 2015-10-21 MED ORDER — MORPHINE SULFATE ER 15 MG PO TBCR: 15.0000 mg | EXTENDED_RELEASE_TABLET | Freq: Three times a day (TID) | ORAL | Status: DC

## 2015-10-21 NOTE — Patient Outreach (Signed)
Transition of care (week 1, second attempt- discharged 2/5).  Spoke with pt, HIPPA verified. RN CM discussed difficulty trying to reach her (previous hospitalization, most recent).   Pt reports was sick, had to go back into the hospital but is doing good now.   Pt reports unable to talk right now, going to pain clinic, will call RN CM after return home.     RN CM to wait for pt to return call- complete transition of care.     Zara Chess.   [facility]  [phone]

## 2015-10-21 NOTE — Patient Instructions (Signed)
You were given a prescription for MS Contin today. Epidural Steroid Injection Patient Information  Description: The epidural space surrounds the nerves as they exit the spinal cord.  In some patients, the nerves can be compressed and inflamed by a bulging disc or a tight spinal canal (spinal stenosis).  By injecting steroids into the epidural space, we can bring irritated nerves into direct contact with a potentially helpful medication.  These steroids act directly on the irritated nerves and can reduce swelling and inflammation which often leads to decreased pain.  Epidural steroids may be injected anywhere along the spine and from the neck to the low back depending upon the location of your pain.   After numbing the skin with local anesthetic (like Novocaine), a small needle is passed into the epidural space slowly.  You may experience a sensation of pressure while this is being done.  The entire block usually last less than 10 minutes.  Conditions which may be treated by epidural steroids:   Low back and leg pain  Neck and arm pain  Spinal stenosis  Post-laminectomy syndrome  Herpes zoster (shingles) pain  Pain from compression fractures  Preparation for the injection:  1. Do not eat any solid food or dairy products within 6 hours of your appointment.  2. You may drink clear liquids up to 2 hours before appointment.  Clear liquids include water, black coffee, juice or soda.  No milk or cream please. 3. You may take your regular medication, including pain medications, with a sip of water before your appointment  Diabetics should hold regular insulin (if taken separately) and take 1/2 normal NPH dos the morning of the procedure.  Carry some sugar containing items with you to your appointment. 4. A driver must accompany you and be prepared to drive you home after your procedure.  5. Bring all your current medications with your. 6. An IV may be inserted and sedation may be given at the  discretion of the physician.   7. A blood pressure cuff, EKG and other monitors will often be applied during the procedure.  Some patients may need to have extra oxygen administered for a short period. 8. You will be asked to provide medical information, including your allergies, prior to the procedure.  We must know immediately if you are taking blood thinners (like Coumadin/Warfarin)  Or if you are allergic to IV iodine contrast (dye). We must know if you could possible be pregnant.  Possible side-effects:  Bleeding from needle site  Infection (rare, may require surgery)  Nerve injury (rare)  Numbness & tingling (temporary)  Difficulty urinating (rare, temporary)  Spinal headache ( a headache worse with upright posture)  Light -headedness (temporary)  Pain at injection site (several days)  Decreased blood pressure (temporary)  Weakness in arm/leg (temporary)  Pressure sensation in back/neck (temporary)  Call if you experience:  Fever/chills associated with headache or increased back/neck pain.  Headache worsened by an upright position.  New onset weakness or numbness of an extremity below the injection site  Hives or difficulty breathing (go to the emergency room)  Inflammation or drainage at the infection site  Severe back/neck pain  Any new symptoms which are concerning to you  Please note:  Although the local anesthetic injected can often make your back or neck feel good for several hours after the injection, the pain will likely return.  It takes 3-7 days for steroids to work in the epidural space.  You may not notice any  pain relief for at least that one week.  If effective, we will often do a series of three injections spaced 3-6 weeks apart to maximally decrease your pain.  After the initial series, we generally will wait several months before considering a repeat injection of the same type.  If you have any questions, please call [phone] [facility]

## 2015-10-21 NOTE — Progress Notes (Signed)
Safety precautions to be maintained throughout the outpatient stay will include: orient to surroundings, keep bed in low position, maintain call bell within reach at all times, provide assistance with transfer out of bed and ambulation.  

## 2015-10-23 NOTE — Progress Notes (Signed)
Subjective:    Patient ID: [name], female    DOB: 1955-11-16, 60 y.o.   MRN: [id number]  HPI   This is a pleasant and seemingly delightful 29 year old lady who presents with a 12 year history of chronic low back  Pain She indicates that this pain is not associated with trauma. She describes the pain as sharp and  Constant and associated with severe muscle spasms The pain radiates to both hips and down both legs and ends in the knees.  Pain intensity rating  Her subjective pain intensity rating is 80%  Her pain is decreased by pain medications  Pain is aggravated by any activity  Pain medications This patient indicates that she takes 60 mg of MS Contin every 12 hours and that she takes oxycodone 5/325 mg 1 tablet every 6 hours  She also takes tizanidine for muscle spasm one 4 mg  tablet 3 times a day  Other medications  Norvasc Eliquis  aspirin 81 mg Bentyl insulin determir lactulose Synthroid Reglan Lopressor and Protonix Zanaflex rifaximin Ambien Lipitor and Zestril  and Zofran  Allergies Patient is allergic to hydrocodone erythromycin prednisone codeine and tetanus toxoid and Rosiglitazone maleate.  Past medical history Past medical history is positive for hypertension and atrial fibrillation fibrillation 2 strokes diabetes type 1 GERD and hiatus hernia  Past surgical history This patient's past surgical histor is that she had cholecystectomy and total abdominal hysterectomy and bilateral salpingo-oophorectomy for uterine cancer she also had a hiatal hernia surgery repair  Social and economic history She does not smoke Does not drink alcohol Does not use illicit [id number] she is receiving social security disability benefits for her chronic low back pain  Family history This patient is married and has been married for 12 years She is para 1+0 with her child being 89 years old Her mother is alive and well at 68 years Her father is deceased at age 3 from a  blood transfusion reaction She has 2 brothers both of them are alive but one brother is age 46 and has a liver and kidney  Transplants for chronic liver and chronic kidney failure The other brother who is age 34 has chronic urinary problems She has 3 sisters: 2 sisters are alive and well at age 86 and 48 years respectively while one sister is deceased at age 29 years from a massive heart attack      Review of Systems  Constitutional: Negative.  Negative for fever, chills, diaphoresis, activity change, appetite change, fatigue and unexpected weight change.  HENT: Negative.  Negative for congestion, dental problem, drooling, ear discharge, ear pain, facial swelling, hearing loss, mouth sores, nosebleeds, postnasal drip, rhinorrhea, sinus pressure, sneezing, sore throat, tinnitus, trouble swallowing and voice change.   Eyes: Negative.  Negative for photophobia, pain, discharge, redness, itching and visual disturbance.  Respiratory: Negative.  Negative for apnea, cough, choking, chest tightness, shortness of breath, wheezing and stridor.   Cardiovascular: Positive for palpitations. Negative for chest pain and leg swelling.       This patient has a history of atrial fibrillation She is also had 2 strokes  Gastrointestinal: Positive for abdominal pain. Negative for nausea, vomiting, diarrhea, constipation, blood in stool, abdominal distention, anal bleeding and rectal pain.       This patient does have a history of hiatus hernia and she has had corrective surgery for that condition. She still has tenderness in her epigastric region as result of her hiatus hernia pathology  Endocrine:  Negative.  Negative for cold intolerance, heat intolerance, polydipsia, polyphagia and polyuria.       She has type 1 diabetes mellitus  Genitourinary: Negative.  Negative for dysuria, urgency, frequency, hematuria, flank pain, decreased urine volume, enuresis, difficulty urinating, genital sores, menstrual problem,  pelvic pain and dyspareunia.  Musculoskeletal: Positive for back pain, arthralgias and gait problem. Negative for myalgias, joint swelling, neck pain and neck stiffness.       This patient is had a 12 year history of  And has been using opioids in very and doses for a long time  Skin: Negative.  Negative for color change, pallor, rash and wound.  Allergic/Immunologic: Negative.  Negative for environmental allergies, food allergies and immunocompromised state.  Neurological: Negative.  Negative for dizziness, tremors, seizures, syncope, facial asymmetry, speech difficulty, weakness, light-headedness, numbness and headaches.       This patient has had 2 strokes in the past  Hematological: Negative.  Negative for adenopathy. Does not bruise/bleed easily.  Psychiatric/Behavioral: Negative.  Negative for hallucinations, behavioral problems, confusion, sleep disturbance, self-injury, dysphoric mood, decreased concentration and agitation. The patient is not nervous/anxious and is not hyperactive.        Objective:   Physical Exam  Constitutional: She is oriented to person, place, and time. She appears well-developed and well-nourished. No distress.  HENT:  Head: Normocephalic and atraumatic.  Right Ear: External ear normal.  Left Ear: External ear normal.  Nose: Nose normal.  Mouth/Throat: Oropharynx is clear and moist. No oropharyngeal exudate.  Eyes: Conjunctivae are normal. Pupils are equal, round, and reactive to light. Right eye exhibits no discharge. Left eye exhibits no discharge. No scleral icterus.  Neck: Normal range of motion. Neck supple. No JVD present. No tracheal deviation present. No thyromegaly present.  Cardiovascular: Normal rate, regular rhythm, normal heart sounds and intact distal pulses.  Exam reveals no gallop and no friction rub.   No murmur heard.  Pressure was 182/69 mmHg Temperature was 98.55F Pulse was 108 weeks per minute Equal and regular Respirations were rest  per minute SPO2 was 100%  Pulmonary/Chest: Effort normal and breath sounds normal. No stridor. No respiratory distress. She has no wheezes. She has no rales. She exhibits no tenderness.  Abdominal: Soft. She exhibits no distension and no mass. There is tenderness. There is no rebound and no guarding.  Genitourinary:  Genitourinary examination was deferred  Musculoskeletal: She exhibits tenderness. She exhibits no edema.  Torsion test was positive as indicative of  facetogenic disease Range of motion was significantly decreased especially on the left  Lower extremity Straight leg raising test on the right side was  30 Straight leg raising test on the left side was 10 Neurological evaluation using light touch and pinprick revealed enhanced sensitivity on the right lower extremity.  Lymphadenopathy:    She has no cervical adenopathy.  Neurological: She is alert and oriented to person, place, and time. She has normal reflexes. She displays normal reflexes. No cranial nerve deficit. She exhibits normal muscle tone. Coordination normal.  Skin: Skin is warm and dry. No rash noted. She is not diaphoretic. No erythema. No pallor.  Psychiatric: She has a normal mood and affect. Her behavior is normal. Judgment and thought content normal.  Nursing note and vitals reviewed.         Assessment & Plan:   Assessment 1 chronic low back pain 2 Lumbar degenerative disease 3 Bilateral lumbar radiculopathy 4 status post hypertension and cerebrovascular accident 5 status post atrial fibrillation  Plan of management Before outlining the pharmacological strategy for this patient's pain management, I contacted the pharmacist to verify the information that the the patient had given me The patient indicated to me that she was taking 60 milligrams of MS Contin . The pharmacists insisted that she was only receiving 30 mg of MS Contin every 12 hours The amount of hydrocodone pain was corroborated by the  pharmacist. With that information it was clear that either the patient was being deceptive or that she was grossly minutes taking the medicines and the amount that she was taking. If the former is the case, then that is grounds for refusal to accept the patient into the clinic for treatment while in the latter is  The case, then it is likely that this may represent an overdose of medication causes in the mid patient's  Memory and cognition to be impaired I will give the patient the benefit of the doubt and assume the latter Accordingly I would plan the following: 1 To review the patient's MRI results to determine the extent of the pathology 2 For a caudal epidural steroid injection at the next visit 3 Will Rx MS Contin 15 mg Q12 for 2 weeks since she has some left over opioimedication from her previous prescription. 4 Will begin a steady program of weaning the patient off opioids since it is possible that she is either over-medicating herself or that she is abusing or worse diverting the medication. 5 Will continue Tizanidine 4 mg TID for muscle spasms 6 Will follow up with her in one month.    New Patient Level 5     Lance Bosch MD

## 2015-10-25 ENCOUNTER — Other Ambulatory Visit: Payer: Self-pay | Admitting: *Deleted

## 2015-10-25 NOTE — Patient Outreach (Signed)
Attempt made to contact pt to complete transition of care.  Spoke with pt 2/10 but  She was on  her way to the pain clinic, was to call RN CM back on return home.   With no response, attempt made today.   HIPPA compliant voice message left with contact number.  If no response, will try again.     Zara Chess.   [facility]  [phone]

## 2015-10-27 LAB — TOXASSURE SELECT 13 (MW), URINE: PDF: 0

## 2015-11-03 ENCOUNTER — Other Ambulatory Visit: Payer: Self-pay | Admitting: *Deleted

## 2015-11-03 NOTE — Patient Outreach (Signed)
Second attempt made to contact pt as part of transition of care.  HIPPA compliant voice message left with contact number.  If no response, will try again.     Zara Chess.   [facility]  [phone]

## 2015-11-10 ENCOUNTER — Other Ambulatory Visit: Payer: Self-pay | Admitting: *Deleted

## 2015-11-10 NOTE — Patient Outreach (Signed)
[facility]) Care Management  11/10/2015  [name] 30-Dec-1955 [id number]  Third attempt made to contact pt as part of transition of care. HIPPA compliant voice message left with contact number; [place] CM updated.  [name], RN, BSN, [facility]  [phone]

## 2015-11-11 ENCOUNTER — Inpatient Hospital Stay (HOSPITAL_COMMUNITY)
Admission: EM | Admit: 2015-11-11 | Discharge: 2015-11-16 | DRG: 194 | Disposition: A | Discharge status: Home / Self Care | Payer: Commercial Managed Care - HMO | Attending: Internal Medicine | Admitting: Internal Medicine

## 2015-11-11 ENCOUNTER — Encounter (HOSPITAL_COMMUNITY): Payer: Self-pay | Admitting: *Deleted

## 2015-11-11 ENCOUNTER — Emergency Department (HOSPITAL_COMMUNITY): Payer: Commercial Managed Care - HMO

## 2015-11-11 ENCOUNTER — Observation Stay (HOSPITAL_COMMUNITY): Payer: Commercial Managed Care - HMO

## 2015-11-11 DIAGNOSIS — K219 Gastro-esophageal reflux disease without esophagitis: Secondary | ICD-10-CM | POA: Diagnosis present

## 2015-11-11 DIAGNOSIS — Z7982 Long term (current) use of aspirin: Secondary | ICD-10-CM

## 2015-11-11 DIAGNOSIS — E119 Type 2 diabetes mellitus without complications: Secondary | ICD-10-CM | POA: Diagnosis not present

## 2015-11-11 DIAGNOSIS — K5909 Other constipation: Secondary | ICD-10-CM | POA: Diagnosis present

## 2015-11-11 DIAGNOSIS — I951 Orthostatic hypotension: Secondary | ICD-10-CM | POA: Insufficient documentation

## 2015-11-11 DIAGNOSIS — R1115 Cyclical vomiting syndrome unrelated to migraine: Secondary | ICD-10-CM

## 2015-11-11 DIAGNOSIS — Z888 Allergy status to other drugs, medicaments and biological substances status: Secondary | ICD-10-CM

## 2015-11-11 DIAGNOSIS — R739 Hyperglycemia, unspecified: Secondary | ICD-10-CM | POA: Diagnosis present

## 2015-11-11 DIAGNOSIS — Z8249 Family history of ischemic heart disease and other diseases of the circulatory system: Secondary | ICD-10-CM | POA: Diagnosis not present

## 2015-11-11 DIAGNOSIS — R079 Chest pain, unspecified: Secondary | ICD-10-CM | POA: Diagnosis not present

## 2015-11-11 DIAGNOSIS — R0781 Pleurodynia: Secondary | ICD-10-CM | POA: Diagnosis not present

## 2015-11-11 DIAGNOSIS — Z8542 Personal history of malignant neoplasm of other parts of uterus: Secondary | ICD-10-CM | POA: Diagnosis not present

## 2015-11-11 DIAGNOSIS — F1123 Opioid dependence with withdrawal: Secondary | ICD-10-CM | POA: Diagnosis not present

## 2015-11-11 DIAGNOSIS — E032 Hypothyroidism due to medicaments and other exogenous substances: Secondary | ICD-10-CM | POA: Diagnosis present

## 2015-11-11 DIAGNOSIS — R509 Fever, unspecified: Secondary | ICD-10-CM

## 2015-11-11 DIAGNOSIS — T462X1A Poisoning by other antidysrhythmic drugs, accidental (unintentional), initial encounter: Secondary | ICD-10-CM

## 2015-11-11 DIAGNOSIS — R109 Unspecified abdominal pain: Secondary | ICD-10-CM

## 2015-11-11 DIAGNOSIS — F329 Major depressive disorder, single episode, unspecified: Secondary | ICD-10-CM | POA: Diagnosis present

## 2015-11-11 DIAGNOSIS — Z8673 Personal history of transient ischemic attack (TIA), and cerebral infarction without residual deficits: Secondary | ICD-10-CM

## 2015-11-11 DIAGNOSIS — S2190XA Unspecified open wound of unspecified part of thorax, initial encounter: Secondary | ICD-10-CM | POA: Diagnosis not present

## 2015-11-11 DIAGNOSIS — Z881 Allergy status to other antibiotic agents status: Secondary | ICD-10-CM

## 2015-11-11 DIAGNOSIS — I480 Paroxysmal atrial fibrillation: Secondary | ICD-10-CM | POA: Diagnosis present

## 2015-11-11 DIAGNOSIS — S298XXA Other specified injuries of thorax, initial encounter: Secondary | ICD-10-CM

## 2015-11-11 DIAGNOSIS — Z885 Allergy status to narcotic agent status: Secondary | ICD-10-CM

## 2015-11-11 DIAGNOSIS — R1110 Vomiting, unspecified: Secondary | ICD-10-CM | POA: Diagnosis not present

## 2015-11-11 DIAGNOSIS — E109 Type 1 diabetes mellitus without complications: Secondary | ICD-10-CM | POA: Diagnosis present

## 2015-11-11 DIAGNOSIS — S299XXA Unspecified injury of thorax, initial encounter: Secondary | ICD-10-CM | POA: Diagnosis not present

## 2015-11-11 DIAGNOSIS — S060X1A Concussion with loss of consciousness of 30 minutes or less, initial encounter: Secondary | ICD-10-CM | POA: Diagnosis present

## 2015-11-11 DIAGNOSIS — T462X5A Adverse effect of other antidysrhythmic drugs, initial encounter: Secondary | ICD-10-CM | POA: Diagnosis present

## 2015-11-11 DIAGNOSIS — E860 Dehydration: Secondary | ICD-10-CM | POA: Diagnosis present

## 2015-11-11 DIAGNOSIS — N200 Calculus of kidney: Secondary | ICD-10-CM | POA: Diagnosis not present

## 2015-11-11 DIAGNOSIS — Z8711 Personal history of peptic ulcer disease: Secondary | ICD-10-CM | POA: Diagnosis not present

## 2015-11-11 DIAGNOSIS — K7469 Other cirrhosis of liver: Secondary | ICD-10-CM | POA: Diagnosis not present

## 2015-11-11 DIAGNOSIS — F1193 Opioid use, unspecified with withdrawal: Secondary | ICD-10-CM | POA: Diagnosis present

## 2015-11-11 DIAGNOSIS — J189 Pneumonia, unspecified organism: Secondary | ICD-10-CM | POA: Diagnosis not present

## 2015-11-11 DIAGNOSIS — Z794 Long term (current) use of insulin: Secondary | ICD-10-CM | POA: Diagnosis not present

## 2015-11-11 DIAGNOSIS — R55 Syncope and collapse: Secondary | ICD-10-CM | POA: Diagnosis not present

## 2015-11-11 DIAGNOSIS — I4581 Long QT syndrome: Secondary | ICD-10-CM | POA: Diagnosis not present

## 2015-11-11 DIAGNOSIS — R05 Cough: Secondary | ICD-10-CM | POA: Diagnosis not present

## 2015-11-11 DIAGNOSIS — D649 Anemia, unspecified: Secondary | ICD-10-CM | POA: Diagnosis present

## 2015-11-11 DIAGNOSIS — F1110 Opioid abuse, uncomplicated: Secondary | ICD-10-CM

## 2015-11-11 DIAGNOSIS — Z887 Allergy status to serum and vaccine status: Secondary | ICD-10-CM | POA: Diagnosis not present

## 2015-11-11 DIAGNOSIS — R52 Pain, unspecified: Secondary | ICD-10-CM

## 2015-11-11 DIAGNOSIS — Z79899 Other long term (current) drug therapy: Secondary | ICD-10-CM

## 2015-11-11 DIAGNOSIS — G894 Chronic pain syndrome: Secondary | ICD-10-CM | POA: Diagnosis present

## 2015-11-11 DIAGNOSIS — E785 Hyperlipidemia, unspecified: Secondary | ICD-10-CM | POA: Diagnosis present

## 2015-11-11 DIAGNOSIS — E440 Moderate protein-calorie malnutrition: Secondary | ICD-10-CM | POA: Diagnosis present

## 2015-11-11 DIAGNOSIS — I4891 Unspecified atrial fibrillation: Secondary | ICD-10-CM | POA: Diagnosis present

## 2015-11-11 DIAGNOSIS — I10 Essential (primary) hypertension: Secondary | ICD-10-CM

## 2015-11-11 DIAGNOSIS — F419 Anxiety disorder, unspecified: Secondary | ICD-10-CM | POA: Diagnosis present

## 2015-11-11 DIAGNOSIS — R9431 Abnormal electrocardiogram [ECG] [EKG]: Secondary | ICD-10-CM | POA: Diagnosis present

## 2015-11-11 DIAGNOSIS — E1065 Type 1 diabetes mellitus with hyperglycemia: Secondary | ICD-10-CM | POA: Diagnosis not present

## 2015-11-11 LAB — PHOSPHORUS: PHOSPHORUS: 3 mg/dL (ref 2.5–4.6)

## 2015-11-11 LAB — IRON AND TIBC
IRON: 57 ug/dL (ref 28–170)
Saturation Ratios: 14 % (ref 10.4–31.8)
TIBC: 405 ug/dL (ref 250–450)
UIBC: 348 ug/dL

## 2015-11-11 LAB — BASIC METABOLIC PANEL
Anion gap: 11 (ref 5–15)
BUN: 9 mg/dL (ref 6–20)
CHLORIDE: 106 mmol/L (ref 101–111)
CO2: 23 mmol/L (ref 22–32)
CREATININE: 0.78 mg/dL (ref 0.44–1.00)
Calcium: 8.8 mg/dL — ABNORMAL LOW (ref 8.9–10.3)
Glucose, Bld: 239 mg/dL — ABNORMAL HIGH (ref 65–99)
POTASSIUM: 3.9 mmol/L (ref 3.5–5.1)
SODIUM: 140 mmol/L (ref 135–145)

## 2015-11-11 LAB — CBG MONITORING, ED: Glucose-Capillary: 178 mg/dL — ABNORMAL HIGH (ref 65–99)

## 2015-11-11 LAB — GLUCOSE, CAPILLARY
Glucose-Capillary: 263 mg/dL — ABNORMAL HIGH (ref 65–99)
Glucose-Capillary: 187 mg/dL — ABNORMAL HIGH (ref 65–99)
Glucose-Capillary: 230 mg/dL — ABNORMAL HIGH (ref 65–99)

## 2015-11-11 LAB — CBC
HEMATOCRIT: 31.4 % — AB (ref 36.0–46.0)
Hemoglobin: 10.2 g/dL — ABNORMAL LOW (ref 12.0–15.0)
MCH: 27.6 pg (ref 26.0–34.0)
MCHC: 32.5 g/dL (ref 30.0–36.0)
MCV: 85.1 fL (ref 78.0–100.0)
Platelets: 176 10*3/uL (ref 150–400)
RBC: 3.69 MIL/uL — ABNORMAL LOW (ref 3.87–5.11)
RDW: 16.3 % — ABNORMAL HIGH (ref 11.5–15.5)
WBC: 4.1 10*3/uL (ref 4.0–10.5)

## 2015-11-11 LAB — FOLATE: FOLATE: 16.2 ng/mL (ref >5.9)

## 2015-11-11 LAB — MAGNESIUM: Magnesium: 1.6 mg/dL — ABNORMAL LOW (ref 1.7–2.4)

## 2015-11-11 LAB — FERRITIN: Ferritin: 29 ng/mL (ref 11–307)

## 2015-11-11 LAB — RETICULOCYTES
RBC.: 3.99 MIL/uL (ref 3.87–5.11)
RETIC COUNT ABSOLUTE: 43.9 10*3/uL (ref 19.0–186.0)
Retic Ct Pct: 1.1 % (ref 0.4–3.1)

## 2015-11-11 LAB — I-STAT TROPONIN, ED: Troponin i, poc: 0 ng/mL (ref 0.00–0.08)

## 2015-11-11 LAB — TSH: TSH: 19.435 u[IU]/mL — AB (ref 0.350–4.500)

## 2015-11-11 LAB — VITAMIN B12: VITAMIN B 12: 848 pg/mL (ref 180–914)

## 2015-11-11 MED ORDER — APIXABAN 5 MG PO TABS
5.0000 mg | ORAL_TABLET | Freq: Two times a day (BID) | ORAL | Status: DC
  Administered 2015-11-11 – 2015-11-16 (×11): 5 mg via ORAL
  Filled 2015-11-11 (×12): qty 1

## 2015-11-11 MED ORDER — DOCUSATE SODIUM 100 MG PO CAPS
100.0000 mg | ORAL_CAPSULE | Freq: Two times a day (BID) | ORAL | Status: DC
  Administered 2015-11-11 – 2015-11-16 (×11): 100 mg via ORAL
  Filled 2015-11-11 (×11): qty 1

## 2015-11-11 MED ORDER — LORAZEPAM 1 MG PO TABS
0.0000 mg | ORAL_TABLET | Freq: Four times a day (QID) | ORAL | Status: DC
  Administered 2015-11-11: 1 mg via ORAL
  Filled 2015-11-11: qty 1

## 2015-11-11 MED ORDER — THIAMINE HCL 100 MG/ML IJ SOLN
100.0000 mg | Freq: Every day | INTRAMUSCULAR | Status: DC
  Filled 2015-11-11: qty 2

## 2015-11-11 MED ORDER — SODIUM CHLORIDE 0.9 % IV BOLUS (SEPSIS)
1000.0000 mL | Freq: Once | INTRAVENOUS | Status: AC
  Administered 2015-11-11: 1000 mL via INTRAVENOUS

## 2015-11-11 MED ORDER — POLYETHYLENE GLYCOL 3350 17 G PO PACK
17.0000 g | PACK | Freq: Every day | ORAL | Status: DC
  Administered 2015-11-11: 17 g via ORAL
  Filled 2015-11-11: qty 1

## 2015-11-11 MED ORDER — PANTOPRAZOLE SODIUM 40 MG PO TBEC
40.0000 mg | DELAYED_RELEASE_TABLET | Freq: Two times a day (BID) | ORAL | Status: DC
  Administered 2015-11-11 – 2015-11-16 (×11): 40 mg via ORAL
  Filled 2015-11-11 (×11): qty 1

## 2015-11-11 MED ORDER — KETOROLAC TROMETHAMINE 15 MG/ML IJ SOLN
15.0000 mg | Freq: Four times a day (QID) | INTRAMUSCULAR | Status: DC | PRN
  Administered 2015-11-11 – 2015-11-12 (×2): 15 mg via INTRAVENOUS
  Filled 2015-11-11 (×4): qty 1

## 2015-11-11 MED ORDER — RIFAXIMIN 550 MG PO TABS
550.0000 mg | ORAL_TABLET | Freq: Two times a day (BID) | ORAL | Status: DC
  Administered 2015-11-11 – 2015-11-16 (×11): 550 mg via ORAL
  Filled 2015-11-11 (×15): qty 1

## 2015-11-11 MED ORDER — FENTANYL CITRATE (PF) 100 MCG/2ML IJ SOLN
50.0000 ug | Freq: Once | INTRAMUSCULAR | Status: AC
  Administered 2015-11-11: 50 ug via INTRAVENOUS
  Filled 2015-11-11: qty 2

## 2015-11-11 MED ORDER — MORPHINE SULFATE ER 15 MG PO TBCR
15.0000 mg | EXTENDED_RELEASE_TABLET | Freq: Three times a day (TID) | ORAL | Status: DC
  Administered 2015-11-11 – 2015-11-12 (×4): 15 mg via ORAL
  Filled 2015-11-11 (×3): qty 1

## 2015-11-11 MED ORDER — ALUM & MAG HYDROXIDE-SIMETH 200-200-20 MG/5ML PO SUSP: 30.0000 mL | Freq: Four times a day (QID) | ORAL | Status: DC | PRN

## 2015-11-11 MED ORDER — FOLIC ACID 1 MG PO TABS
1.0000 mg | ORAL_TABLET | Freq: Every day | ORAL | Status: DC
  Administered 2015-11-11 – 2015-11-16 (×6): 1 mg via ORAL
  Filled 2015-11-11 (×6): qty 1

## 2015-11-11 MED ORDER — PROMETHAZINE HCL 25 MG/ML IJ SOLN
12.5000 mg | Freq: Once | INTRAMUSCULAR | Status: AC
  Administered 2015-11-11: 12.5 mg via INTRAVENOUS
  Filled 2015-11-11: qty 1

## 2015-11-11 MED ORDER — ASPIRIN EC 81 MG PO TBEC
81.0000 mg | DELAYED_RELEASE_TABLET | Freq: Every day | ORAL | Status: DC
  Administered 2015-11-11 – 2015-11-16 (×6): 81 mg via ORAL
  Filled 2015-11-11 (×6): qty 1

## 2015-11-11 MED ORDER — LORAZEPAM 1 MG PO TABS
1.0000 mg | ORAL_TABLET | Freq: Four times a day (QID) | ORAL | Status: AC | PRN
  Administered 2015-11-13: 1 mg via ORAL
  Filled 2015-11-11: qty 1

## 2015-11-11 MED ORDER — SODIUM CHLORIDE 0.9 % IV BOLUS (SEPSIS)
500.0000 mL | Freq: Once | INTRAVENOUS | Status: AC
Start: 2015-11-11 — End: 2015-11-11
  Administered 2015-11-11: 500 mL via INTRAVENOUS

## 2015-11-11 MED ORDER — SODIUM CHLORIDE 0.9% FLUSH
3.0000 mL | Freq: Two times a day (BID) | INTRAVENOUS | Status: DC
  Administered 2015-11-11 – 2015-11-15 (×5): 3 mL via INTRAVENOUS

## 2015-11-11 MED ORDER — MAGNESIUM OXIDE 400 (241.3 MG) MG PO TABS
400.0000 mg | ORAL_TABLET | Freq: Two times a day (BID) | ORAL | Status: DC
  Administered 2015-11-11 – 2015-11-16 (×10): 400 mg via ORAL
  Filled 2015-11-11 (×12): qty 1

## 2015-11-11 MED ORDER — LEVOTHYROXINE SODIUM 75 MCG PO TABS
75.0000 ug | ORAL_TABLET | Freq: Every day | ORAL | Status: DC
  Administered 2015-11-11 – 2015-11-16 (×6): 75 ug via ORAL
  Filled 2015-11-11 (×6): qty 1

## 2015-11-11 MED ORDER — SODIUM CHLORIDE 0.9 % IV SOLN
INTRAVENOUS | Status: DC
  Administered 2015-11-11: 150 mL/h via INTRAVENOUS

## 2015-11-11 MED ORDER — AMLODIPINE BESYLATE 5 MG PO TABS
5.0000 mg | ORAL_TABLET | Freq: Every day | ORAL | Status: DC
  Administered 2015-11-11 – 2015-11-13 (×3): 5 mg via ORAL
  Filled 2015-11-11 (×3): qty 1

## 2015-11-11 MED ORDER — METOPROLOL TARTRATE 25 MG PO TABS
25.0000 mg | ORAL_TABLET | Freq: Two times a day (BID) | ORAL | Status: DC
  Administered 2015-11-11 – 2015-11-14 (×6): 25 mg via ORAL
  Filled 2015-11-11 (×6): qty 1

## 2015-11-11 MED ORDER — MAGNESIUM SULFATE 2 GM/50ML IV SOLN
2.0000 g | Freq: Once | INTRAVENOUS | Status: AC
  Administered 2015-11-11: 2 g via INTRAVENOUS
  Filled 2015-11-11: qty 50

## 2015-11-11 MED ORDER — ADULT MULTIVITAMIN W/MINERALS CH
1.0000 | ORAL_TABLET | Freq: Every day | ORAL | Status: DC
  Administered 2015-11-11 – 2015-11-16 (×6): 1 via ORAL
  Filled 2015-11-11 (×6): qty 1

## 2015-11-11 MED ORDER — PROMETHAZINE HCL 25 MG PO TABS
12.5000 mg | ORAL_TABLET | Freq: Four times a day (QID) | ORAL | Status: DC | PRN
  Administered 2015-11-11: 12.5 mg via ORAL
  Filled 2015-11-11: qty 1

## 2015-11-11 MED ORDER — LORAZEPAM 2 MG/ML IJ SOLN
1.0000 mg | Freq: Four times a day (QID) | INTRAMUSCULAR | Status: AC | PRN
  Administered 2015-11-11: 1 mg via INTRAVENOUS
  Filled 2015-11-11: qty 1

## 2015-11-11 MED ORDER — LORAZEPAM 1 MG PO TABS
0.0000 mg | ORAL_TABLET | Freq: Two times a day (BID) | ORAL | Status: DC
Start: 2015-11-13 — End: 2015-11-12

## 2015-11-11 MED ORDER — INSULIN ASPART 100 UNIT/ML ~~LOC~~ SOLN
0.0000 [IU] | SUBCUTANEOUS | Status: DC
  Administered 2015-11-11: 3 [IU] via SUBCUTANEOUS
  Administered 2015-11-11: 5 [IU] via SUBCUTANEOUS
  Administered 2015-11-11: 8 [IU] via SUBCUTANEOUS
  Administered 2015-11-11 – 2015-11-12 (×2): 3 [IU] via SUBCUTANEOUS
  Administered 2015-11-12: 2 [IU] via SUBCUTANEOUS
  Administered 2015-11-12: 5 [IU] via SUBCUTANEOUS
  Administered 2015-11-12 (×2): 3 [IU] via SUBCUTANEOUS
  Administered 2015-11-13: 8 [IU] via SUBCUTANEOUS
  Administered 2015-11-13: 3 [IU] via SUBCUTANEOUS
  Administered 2015-11-13: 2 [IU] via SUBCUTANEOUS
  Administered 2015-11-13: 5 [IU] via SUBCUTANEOUS
  Administered 2015-11-14 (×3): 3 [IU] via SUBCUTANEOUS
  Administered 2015-11-14: 5 [IU] via SUBCUTANEOUS
  Filled 2015-11-11: qty 1

## 2015-11-11 MED ORDER — INSULIN DETEMIR 100 UNIT/ML ~~LOC~~ SOLN
17.0000 [IU] | Freq: Every day | SUBCUTANEOUS | Status: DC
  Administered 2015-11-11 – 2015-11-16 (×6): 17 [IU] via SUBCUTANEOUS
  Filled 2015-11-11 (×7): qty 0.17

## 2015-11-11 MED ORDER — SODIUM CHLORIDE 0.9 % IV BOLUS (SEPSIS)
500.0000 mL | Freq: Once | INTRAVENOUS | Status: AC
  Administered 2015-11-11: 500 mL via INTRAVENOUS

## 2015-11-11 MED ORDER — VITAMIN B-1 100 MG PO TABS
100.0000 mg | ORAL_TABLET | Freq: Every day | ORAL | Status: DC
  Administered 2015-11-11 – 2015-11-16 (×6): 100 mg via ORAL
  Filled 2015-11-11 (×6): qty 1

## 2015-11-11 MED ORDER — ZOLPIDEM TARTRATE 5 MG PO TABS
10.0000 mg | ORAL_TABLET | Freq: Every day | ORAL | Status: DC
  Administered 2015-11-11: 10 mg via ORAL
  Filled 2015-11-11: qty 2

## 2015-11-11 MED ORDER — TIZANIDINE HCL 4 MG PO TABS
4.0000 mg | ORAL_TABLET | Freq: Three times a day (TID) | ORAL | Status: DC | PRN
  Administered 2015-11-12: 4 mg via ORAL
  Filled 2015-11-11: qty 1

## 2015-11-11 NOTE — ED Notes (Signed)
Pt to ED from home by EMS c/o syncopal episodes "x10" today with generalized weakness and frequent falls. Pt noted to by orthostatic: lying bp 140/70, sitting bp 100/70. Pt co substernal chest pain x 2 days; does take Eliquis and a beta blocker. Initial HR 43; 0.86m atropine given with increase of pulse to 70. Pt c/onausea, ribcage pain, chest pain. CBG 284

## 2015-11-11 NOTE — ED Notes (Signed)
Admitting MD at bedside.

## 2015-11-11 NOTE — Progress Notes (Signed)
Called by ER physician for admission. This is a lady coming in with recurrent syncope. She has orthostatic hypotension, nausea and vomiting. She is received fluids and is still orthostatic. Her ER physician her lowest systolic blood pressures in the 80s. Patient also has a prolonged QTc interval. She moved to the floor, AM. hospitalist teaM to see patient

## 2015-11-11 NOTE — ED Notes (Signed)
Attempted to ambulate pt in room; pt assisted to standing position, appears generally weak, c/o dizziness, having to hold onto RN in order to stand.

## 2015-11-11 NOTE — ED Notes (Signed)
Lab to recollect BMP due to hemolysis

## 2015-11-11 NOTE — ED Provider Notes (Signed)
CSN: [id number]     Arrival date & time 11/11/15  11-06-2037 History  By signing my name below, I, Nicole Kindred, attest that this documentation has been prepared under the direction and in the presence of Ripley Fraise, MD.   Electronically Signed: Nicole Kindred, ED Scribe. 11/11/2015. 12:59 AM     No chief complaint on file.  Patient is a 60 y.o. female presenting with syncope. The history is provided by the patient. No language interpreter was used.  Loss of Consciousness Episode history:  Multiple Most recent episode:  Today Timing:  Sporadic Progression:  Unchanged Chronicity:  New Relieved by:  Nothing Worsened by:  Nothing tried Ineffective treatments:  None tried Associated symptoms: chest pain, headaches and nausea   Associated symptoms: no fever and no vomiting    HPI Comments: [name] is a 60 y.o. female who presents to the Emergency Department complaining of sudden onset, syncope, onset about three days ago. Pt states that she has passed out about 12 times since her symptoms began. Pt has hit her head several times during her syncope and reports she lost control of bowels during one syncopal episode. She reports associated chest pain, nausea, loss of appetite, and headache. No worsening or alleviating factors noted. Pt denies fever, vomiting, abdominal pain, or any other pertinent symptoms.    Past Medical History  Diagnosis Date  . Type 1 diabetes ([place])   . Hypothyroid   . Degenerative disk disease   . Stomach ulcer   . Diverticulitis   . Syncope 01/2015  . Anxiety   . GERD (gastroesophageal reflux disease)   . History of hiatal hernia   . Cancer ([facility])     HX OF CANCER OF UTERUS   . TIA (transient ischemic attack) 02/2015  . Hypertension   . Pancreatitis   . PAF (paroxysmal atrial fibrillation) ([place]) 03/2015    a. new onset 03/2015 in setting of intractable N/V; b. on Eliquis 5 mg bid; c. CHADSVASc 4 (DM, TIA x 2, female)  . Intussusception intestine  ([place]) 05/2015  . Stroke ([place])   . Allergy   . Cirrhosis of liver not due to alcohol ([place]) 2014-11-06   Past Surgical History  Procedure Laterality Date  . Hernia repair    . Abdominal hysterectomy    . Cholecystectomy    . Esophagogastroduodenoscopy N/A 04/04/2015    Procedure: ESOPHAGOGASTRODUODENOSCOPY (EGD);  Surgeon: Hulen Luster, MD;  Location: [facility] ENDOSCOPY;  Service: Endoscopy;  Laterality: N/A;   Family History  Problem Relation Age of Onset  . Hypertension Mother   . CAD Sister   . Heart attack Sister     Deceased 11-06-2014  . CAD Brother    Social History  Substance Use Topics  . Smoking status: Never Smoker   . Smokeless tobacco: Never Used  . Alcohol Use: No   OB History    No data available     Review of Systems  Constitutional: Negative for fever.  Cardiovascular: Positive for chest pain and syncope.  Gastrointestinal: Positive for nausea. Negative for vomiting and abdominal pain.  Neurological: Positive for headaches.  All other systems reviewed and are negative.   Allergies  Hydrocodone; Erythromycin base; Prednisone; Rosiglitazone maleate; Codeine sulfate; and Tetanus-diphtheria toxoids td  Home Medications   Prior to Admission medications   Medication Sig Start Date End Date Taking? Authorizing Provider  amLODipine (NORVASC) 5 MG tablet Take 1 tablet (5 mg total) by mouth daily. 10/08/15   Loleta Chance, MD  apixaban (ELIQUIS) 5 MG TABS tablet Take 1 tablet (5 mg total) by mouth 2 (two) times daily. 04/07/15   Bettey Costa, MD  aspirin EC 81 MG EC tablet Take 1 tablet (81 mg total) by mouth daily. 10/08/15   Loleta Chance, MD  atorvastatin (LIPITOR) 40 MG tablet Take 1 tablet (40 mg total) by mouth daily at 6 PM. Patient not taking: Reported on 10/04/2015 02/17/15   Bettey Costa, MD  dicyclomine (BENTYL) 10 MG capsule Take 1 capsule by mouth 4 (four) times daily - after meals and at bedtime.  03/11/15   Historical Provider, MD  feeding supplement, GLUCERNA SHAKE, (GLUCERNA  SHAKE) LIQD Take 237 mLs by mouth 3 (three) times daily after meals. 10/08/15   Loleta Chance, MD  insulin detemir (LEVEMIR) 100 UNIT/ML injection Inject 0.1 mLs (10 Units total) into the skin daily. Patient taking differently: Inject 17 Units into the skin daily.  10/16/15   Vaughan Basta, MD  lactulose (CHRONULAC) 10 GM/15ML solution Take 15-45 mLs (10-30 g total) by mouth 2 (two) times daily as needed (P). 10/08/15   Loleta Chance, MD  levothyroxine (SYNTHROID, LEVOTHROID) 75 MCG tablet Take 1 tablet (75 mcg total) by mouth daily before breakfast. 10/08/15   Loleta Chance, MD  lisinopril (PRINIVIL,ZESTRIL) 10 MG tablet Take 0.5 tablets (5 mg total) by mouth 2 (two) times daily. Patient not taking: Reported on 10/04/2015 07/11/15   Theodoro Grist, MD  metoCLOPramide (REGLAN) 5 MG tablet Take 1 tablet (5 mg total) by mouth 4 (four) times daily -  before meals and at bedtime. 10/16/15   Vaughan Basta, MD  metoprolol tartrate (LOPRESSOR) 25 MG tablet Take 1 tablet (25 mg total) by mouth 2 (two) times daily. 10/08/15   Loleta Chance, MD  morphine (MS CONTIN) 15 MG 12 hr tablet Take 1 tablet (15 mg total) by mouth every 8 (eight) hours. 10/21/15   Lance Bosch, MD  ondansetron (ZOFRAN) 4 MG tablet Take 1 tablet (4 mg total) by mouth daily as needed for nausea or vomiting. Patient not taking: Reported on 10/04/2015 08/16/15   Nicholes Mango, MD  oxyCODONE-acetaminophen (ROXICET) 5-325 MG tablet Take 1 tablet by mouth every 6 (six) hours as needed. 08/16/15   Nicholes Mango, MD  pantoprazole (PROTONIX) 40 MG tablet Take 1 tablet (40 mg total) by mouth 2 (two) times daily. 04/07/15   Bettey Costa, MD  rifaximin (XIFAXAN) 550 MG TABS tablet Take 1 tablet (550 mg total) by mouth 2 (two) times daily. 07/11/15   Theodoro Grist, MD  tiZANidine (ZANAFLEX) 4 MG tablet Take 4 mg by mouth 3 (three) times daily as needed for muscle spasms.  08/31/15   Historical Provider, MD  zolpidem (AMBIEN) 10 MG tablet Take 10 mg by mouth at  bedtime. Reported on 10/04/2015    Historical Provider, MD   BP 172/78 mmHg  Pulse 61  Temp(Src) 98.3 F (36.8 C) (Oral)  Resp 16  SpO2 100% Physical Exam CONSTITUTIONAL: elderly and frail. HEAD: Diffuse TTP to right side of scalp. No step off. No crepitus.  EYES: EOMI/PERRL ENMT: Mucous membranes moist NECK: supple no meningeal signs SPINE/BACK:entire spine nontender CV: S1/S2 noted, no murmurs/rubs/gallops noted LUNGS: Lungs are clear to auscultation bilaterally, no apparent distress ABDOMEN: soft, nontender, no rebound or guarding, bowel sounds noted throughout abdomen GU:no cva tenderness NEURO: Pt is awake/alert/appropriate, moves all extremitiesx4.  No facial droop.   EXTREMITIES: pulses normal/equal, full ROM, All other extremities/joints palpated/ranged and nontender SKIN: warm, color normal PSYCH: no abnormalities  of mood noted, alert and oriented to situation   ED Course  Procedures  DIAGNOSTIC STUDIES: Oxygen Saturation is 100% on RA, normal by my interpretation.    COORDINATION OF CARE: 2:34 AM-Discussed treatment plan which includes BMP, CBC, I-stat troponin, CT head without contrast, CXR, and EKG with pt at bedside and pt agreed to plan.   5:57 AM Pt had significant orthostatic hypotension She was given IV fluids and still felt dizzy upon standing She has prolonged QT, so will avoid zofran While resting in bed pt is stable Will admit D/w dr Claria Dice for admission for syncope  Labs Review Labs Reviewed  CBC - Abnormal; Notable for the following:    RBC 3.69 (*)    Hemoglobin 10.2 (*)    HCT 31.4 (*)    RDW 16.3 (*)    All other components within normal limits  BASIC METABOLIC PANEL - Abnormal; Notable for the following:    Glucose, Bld 239 (*)    Calcium 8.8 (*)    All other components within normal limits  I-STAT TROPOININ, ED    Imaging Review Dg Chest 2 View  11/11/2015  CLINICAL DATA:  Acute onset of syncope. Generalized weakness and frequent  falls. Initial encounter. EXAM: CHEST  2 VIEW COMPARISON:  Chest radiograph performed 10/05/2015 FINDINGS: The lungs are mildly hypoexpanded but appear grossly clear. There is no evidence of focal opacification, pleural effusion or pneumothorax. The heart is normal in size; the mediastinal contour is within normal limits. No acute osseous abnormalities are seen. Clips are noted within the right upper quadrant, reflecting prior cholecystectomy. IMPRESSION: Lungs mildly hypoexpanded but grossly clear. No displaced rib fracture seen. Electronically Signed   By: Garald Balding M.D.   On: 11/11/2015 03:13   Ct Head Wo Contrast  11/11/2015  CLINICAL DATA:  Multiple recent syncopal episodes and falls. Initial encounter. EXAM: CT HEAD WITHOUT CONTRAST TECHNIQUE: Contiguous axial images were obtained from the base of the skull through the vertex without intravenous contrast. COMPARISON:  CT of the head and MRI of the brain performed 10/04/2015 FINDINGS: There is no evidence of acute infarction, mass lesion, or intra- or extra-axial hemorrhage on CT. The posterior fossa, including the cerebellum, brainstem and fourth ventricle, is within normal limits. The third and lateral ventricles, and basal ganglia are unremarkable in appearance. The cerebral hemispheres are symmetric in appearance, with normal gray-white differentiation. No mass effect or midline shift is seen. There is no evidence of fracture; visualized osseous structures are unremarkable in appearance. The visualized portions of the orbits are within normal limits. The paranasal sinuses and mastoid air cells are well-aerated. No significant soft tissue abnormalities are seen. IMPRESSION: No evidence of traumatic intracranial injury or fracture. Electronically Signed   By: Garald Balding M.D.   On: 11/11/2015 03:58   I have personally reviewed and evaluated these lab results as part of my medical decision-making.   EKG Interpretation   Date/Time:  Friday  November 11 2015 00:50:09 EST Ventricular Rate:  62 PR Interval:  178 QRS Duration: 95 QT Interval:  508 QTC Calculation: 516 R Axis:   69 Text Interpretation:  Sinus rhythm Low voltage, precordial leads  Borderline T wave abnormalities Prolonged QT interval Confirmed by  Christy Gentles  MD, Mailani Degroote ([id number]) on 11/11/2015 1:11:05 AM     Medications  promethazine (PHENERGAN) injection 12.5 mg (not administered)  sodium chloride 0.9 % bolus 500 mL (not administered)  promethazine (PHENERGAN) injection 12.5 mg (12.5 mg Intravenous Given 11/11/15 0248)  fentaNYL (SUBLIMAZE) injection 50 mcg (50 mcg Intravenous Given 11/11/15 0247)  sodium chloride 0.9 % bolus 500 mL (0 mLs Intravenous Stopped 11/11/15 0324)  sodium chloride 0.9 % bolus 1,000 mL (1,000 mLs Intravenous New Bag/Given 11/11/15 0328)  fentaNYL (SUBLIMAZE) injection 50 mcg (50 mcg Intravenous Given 11/11/15 0501)    MDM   Final diagnoses:  Syncope, unspecified syncope type  Prolonged Q-T interval on ECG  Concussion, with loss of consciousness of 30 minutes or less, initial encounter  Blunt chest trauma, initial encounter  Hyperglycemia   Nursing notes including past medical history and social history reviewed and considered in documentation Labs/vital reviewed myself and considered during evaluation  I personally performed the services described in this documentation, which was scribed in my presence. The recorded information has been reviewed and is accurate.        Ripley Fraise, MD 11/11/15 [phone]

## 2015-11-11 NOTE — Care Management Obs Status (Signed)
[place] NOTIFICATION   Patient Details  Name: [name] MRN: [id number] Date of Birth: 1956/06/06   Medicare Observation Status Notification Given:  Yes    CrutchfieldAntony Haste, RN 11/11/2015, 3:03 PM

## 2015-11-11 NOTE — H&P (Signed)
[facility] History and Physical                                                                                    [name], is a 60 y.o. female  MRN: [id number]   DOB - Feb 21, 1956  Admit Date - 11/11/2015  Outpatient Primary MD for the patient is [place] Pink, MD  Referring MD: Christy Gentles / ER  PMH: Past Medical History  Diagnosis Date  . Type 1 diabetes ([place])   . Hypothyroid   . Degenerative disk disease   . Stomach ulcer   . Diverticulitis   . Syncope 01/2015  . Anxiety   . GERD (gastroesophageal reflux disease)   . History of hiatal hernia   . Cancer (HCC)     HX OF CANCER OF UTERUS   . TIA (transient ischemic attack) 02/2015  . Hypertension   . Pancreatitis   . PAF (paroxysmal atrial fibrillation) ([place]) 03/2015    a. new onset 03/2015 in setting of intractable N/V; b. on Eliquis 5 mg bid; c. CHADSVASc 4 (DM, TIA x 2, female)  . Intussusception intestine ([place]) 05/2015  . Stroke ([place])   . Allergy   . Cirrhosis of liver not due to alcohol ([place]) 2016      PSH: Past Surgical History  Procedure Laterality Date  . Hernia repair    . Abdominal hysterectomy    . Cholecystectomy    . Esophagogastroduodenoscopy N/A 04/04/2015    Procedure: ESOPHAGOGASTRODUODENOSCOPY (EGD);  Surgeon: Hulen Luster, MD;  Location: [facility] ENDOSCOPY;  Service: Endoscopy;  Laterality: N/A;     CC: Syncope    HPI: 60 year old female patient with underlying cryptogenic cirrhosis, hypertension, hypothyroidism, paroxysmal nature fibrillation on eliquis, chronic anemia, chronic epigastric/right upper quadrant abdominal pain, chronic low back pain (followed at pain clinic), GERD, history of TIA, depression and anxiety, and dyslipidemia. Patient was recently discharged from [facility] after an admission for intractable nausea and vomiting presumed to be related viral gastroenteritis. On 10/23/15 patient had initial evaluation with Dr. Lance Bosch (Pain Clinic). During that  evaluation the patient reported that she was taking 60 mg of MS Contin every 12 hours with oxycodone 5/325 one tablet every 6 hours as well as tizanidine for muscle spasms. Pharmacist countered that patient was only receiving 30 mg of MS Contin every 12 hours. There were concerns that the patient may be being deceptive regarding her pain medications or that she was having memory and cognition impairment from her medications and this was affecting appropriate report of how much medication she was taking. Extensive plan was outlined including review of MRI, plans for caudal epidural steroid injection and to begin the study program of weaning the patient off of opioids since it was possible that she was either overmedicating herself that she was abusing or potentially diverting the medication. She was given a prescription of MS Contin 15 mg every 12 hours for 2 weeks since she has left over PO medication from her previous prescription. Patient now presents to the ER with complaints of multiple syncopal episodes and reported hitting her head. In further discussion with the patient she says the past 3-4  days she has been significantly nauseated without emesis or diarrhea. She has been unable tolerate either solid foods or fluids. She reports that when she stands up that her "ears burn" she becomes dizzy. She reports she has chronic palpitations but no change in palpitations since onset of the symptoms. I discussed with patient possibility that her symptoms were related to narcotic withdrawal which she immediately stated "these are not narcotic withdrawal symptoms". She insists her symptoms are related to her heart. She states she was told during a previous hospitalization that she needed a pacemaker. She states she does not have a primary cardiologist. Her primary request at this juncture is for pain medications.  ER Evaluation and treatment: Initial vital signs: Temp 98.3, BP 172/78, room air saturations  100% Orthostatic vital signs: Supine 145/73 with a pulse of 54-sitting 81/62 with a pulse of 68 EKG: Sinus rhythm with ventricular rate 62 bpm, QTC 516 ms (recent prolongation with QTC 645 ms on recent EKG in January), elevated J point in inferior lateral leads CT head without contrast: No acute injury Laboratory data: Na 140, K 3.9, BUN 9, Cr 0.78, glucose 239, troponin 0.00, WBC 4100, hemoglobin 10.2, platelets 276,000 Phenergan 12.5 mg IV 2 Fentanyl 50 g IV 2 Normal saline bolus 500 mL 1  Review of Systems   In addition to the HPI above,  No Fever-chills, myalgias or other constitutional symptoms No Headache, changes with Vision or hearing, new weakness, tingling, numbness in any extremity, No problems swallowing food or Liquids, indigestion/reflux No Chest pain, Cough or Shortness of Breath, palpitations, orthopnea or DOE No emesis; no melena or hematochezia, no dark tarry stools No dysuria, hematuria or flank pain No new skin rashes, lesions, masses or bruises, No new joints pains-aches No recent weight gain or loss No polyuria, polydypsia or polyphagia,  *A full 10 point Review of Systems was done, except as stated above, all other Review of Systems were negative.  Social History Social History  Substance Use Topics  . Smoking status: Never Smoker   . Smokeless tobacco: Never Used  . Alcohol Use: No    Resides at: Private residence  Lives with: Husband and children  Ambulatory status: Without assistive devices   Family History Family History  Problem Relation Age of Onset  . Hypertension Mother   . CAD Sister   . Heart attack Sister     Deceased 11/21/2014  . CAD Brother      Prior to Admission medications   Medication Sig Start Date End Date Taking? Authorizing Provider  amLODipine (NORVASC) 5 MG tablet Take 1 tablet (5 mg total) by mouth daily. 10/08/15  Yes Loleta Chance, MD  apixaban (ELIQUIS) 5 MG TABS tablet Take 1 tablet (5 mg total) by mouth 2 (two)  times daily. 04/07/15  Yes Bettey Costa, MD  aspirin EC 81 MG EC tablet Take 1 tablet (81 mg total) by mouth daily. 10/08/15  Yes Loleta Chance, MD  dicyclomine (BENTYL) 10 MG capsule Take 1 capsule by mouth 4 (four) times daily - after meals and at bedtime.  03/11/15  Yes Historical Provider, MD  feeding supplement, GLUCERNA SHAKE, (GLUCERNA SHAKE) LIQD Take 237 mLs by mouth 3 (three) times daily after meals. Patient taking differently: Take 237 mLs by mouth 3 (three) times daily as needed (meal replacement).  10/08/15  Yes Loleta Chance, MD  insulin detemir (LEVEMIR) 100 UNIT/ML injection Inject 0.1 mLs (10 Units total) into the skin daily. Patient taking differently: Inject 17 Units into the  skin daily.  10/16/15  Yes Vaughan Basta, MD  lactulose (CHRONULAC) 10 GM/15ML solution Take 15-45 mLs (10-30 g total) by mouth 2 (two) times daily as needed (P). Patient taking differently: Take 10-30 g by mouth 2 (two) times daily as needed for mild constipation.  10/08/15  Yes Loleta Chance, MD  levothyroxine (SYNTHROID, LEVOTHROID) 75 MCG tablet Take 1 tablet (75 mcg total) by mouth daily before breakfast. 10/08/15  Yes Loleta Chance, MD  metoCLOPramide (REGLAN) 5 MG tablet Take 1 tablet (5 mg total) by mouth 4 (four) times daily -  before meals and at bedtime. 10/16/15  Yes Vaughan Basta, MD  metoprolol tartrate (LOPRESSOR) 25 MG tablet Take 1 tablet (25 mg total) by mouth 2 (two) times daily. 10/08/15  Yes Loleta Chance, MD  morphine (MS CONTIN) 15 MG 12 hr tablet Take 1 tablet (15 mg total) by mouth every 8 (eight) hours. Patient taking differently: Take 30 mg by mouth every 8 (eight) hours.  10/21/15  Yes Lance Bosch, MD  pantoprazole (PROTONIX) 40 MG tablet Take 1 tablet (40 mg total) by mouth 2 (two) times daily. 04/07/15  Yes Bettey Costa, MD  rifaximin (XIFAXAN) 550 MG TABS tablet Take 1 tablet (550 mg total) by mouth 2 (two) times daily. 07/11/15  Yes Theodoro Grist, MD  tiZANidine (ZANAFLEX) 4 MG tablet  Take 4 mg by mouth 3 (three) times daily as needed for muscle spasms.  08/31/15  Yes Historical Provider, MD  zolpidem (AMBIEN) 10 MG tablet Take 10 mg by mouth at bedtime. Reported on 10/04/2015   Yes Historical Provider, MD    Allergies  Allergen Reactions  . Hydrocodone Other (See Comments)    Patient states "MD says hydrocodone caused the cirrohsis of the liver".  . Erythromycin Base Other (See Comments)    Reaction:  Fever   . Prednisone     unknown  . Rosiglitazone Maleate Swelling  . Codeine Sulfate Rash  . Tetanus-Diphtheria Toxoids Td Rash and Other (See Comments)    Reaction:  Fever     Physical Exam  Vitals  Blood pressure 144/67, pulse 52, temperature 98.3 F (36.8 C), temperature source Oral, resp. rate 13, SpO2 100 %.   General:  In no acute distress, appears chronically ill  Psych:  Normal affect, Denies Suicidal or Homicidal ideations, Awake Alert, Oriented X 3. Speech and thought patterns are clear and appropriate, no apparent short term memory deficits  Neuro:   No focal neurological deficits, CN II through XII intact, Strength 5/5 all 4 extremities, Sensation intact all 4 extremities.  ENT:  Ears and Eyes appear Normal, Conjunctivae clear, PER. Dry oral mucosa without erythema or exudates.  Neck:  Supple, No lymphadenopathy appreciated  Respiratory:  Symmetrical chest wall movement, Good air movement bilaterally, CTAB. Room Air  Cardiac:  RRR, No Murmurs, no LE edema noted, no JVD, No carotid bruits, peripheral pulses palpable at 2+  Abdomen:  Positive bowel sounds, Soft, mildly tender right upper quadrant/epigastrium without guarding or rebounding, Non distended,  No masses appreciated  Skin:  No Cyanosis, poor Skin Turgor, No Skin Rash or Bruise.  Extremities: Symmetrical without obvious trauma or injury,  no effusions.  Data Review  CBC  Recent Labs Lab 11/11/15 0100  WBC 4.1  HGB 10.2*  HCT 31.4*  PLT 176  MCV 85.1  MCH 27.6  MCHC 32.5   RDW 16.3*    Chemistries   Recent Labs Lab 11/11/15 0150  NA 140  K 3.9  CL 106  CO2 23  GLUCOSE 239*  BUN 9  CREATININE 0.78  CALCIUM 8.8*    CrCl cannot be calculated (Unknown ideal weight.).  No results for input(s): TSH, T4TOTAL, T3FREE, THYROIDAB in the last 72 hours.  Invalid input(s): FREET3  Coagulation profile No results for input(s): INR, PROTIME in the last 168 hours.  No results for input(s): DDIMER in the last 72 hours.  Cardiac Enzymes No results for input(s): CKMB, TROPONINI, MYOGLOBIN in the last 168 hours.  Invalid input(s): CK  Invalid input(s): POCBNP  Urinalysis    Component Value Date/Time   COLORURINE YELLOW* 10/14/2015 1742   COLORURINE Yellow 10/08/2014 0144   APPEARANCEUR CLEAR* 10/14/2015 1742   APPEARANCEUR Clear 10/08/2014 0144   LABSPEC 1.042* 10/14/2015 1742   LABSPEC 1.033 10/08/2014 0144   PHURINE 6.0 10/14/2015 1742   PHURINE 6.0 10/08/2014 0144   GLUCOSEU >500* 10/14/2015 1742   GLUCOSEU >=500 10/08/2014 0144   HGBUR 1+* 10/14/2015 1742   HGBUR 1+ 10/08/2014 0144   BILIRUBINUR NEGATIVE 10/14/2015 1742   BILIRUBINUR Negative 10/08/2014 0144   KETONESUR 1+* 10/14/2015 1742   KETONESUR 2+ 10/08/2014 0144   PROTEINUR NEGATIVE 10/14/2015 1742   PROTEINUR 100 mg/dL 10/08/2014 0144   UROBILINOGEN 0.2 05/27/2015 1936   NITRITE NEGATIVE 10/14/2015 1742   NITRITE Negative 10/08/2014 0144   LEUKOCYTESUR NEGATIVE 10/14/2015 1742   LEUKOCYTESUR Negative 10/08/2014 0144    Imaging results:   Dg Chest 2 View  11/11/2015  CLINICAL DATA:  Acute onset of syncope. Generalized weakness and frequent falls. Initial encounter. EXAM: CHEST  2 VIEW COMPARISON:  Chest radiograph performed 10/05/2015 FINDINGS: The lungs are mildly hypoexpanded but appear grossly clear. There is no evidence of focal opacification, pleural effusion or pneumothorax. The heart is normal in size; the mediastinal contour is within normal limits. No acute osseous  abnormalities are seen. Clips are noted within the right upper quadrant, reflecting prior cholecystectomy. IMPRESSION: Lungs mildly hypoexpanded but grossly clear. No displaced rib fracture seen. Electronically Signed   By: Garald Balding M.D.   On: 11/11/2015 03:13   Ct Head Wo Contrast  11/11/2015  CLINICAL DATA:  Multiple recent syncopal episodes and falls. Initial encounter. EXAM: CT HEAD WITHOUT CONTRAST TECHNIQUE: Contiguous axial images were obtained from the base of the skull through the vertex without intravenous contrast. COMPARISON:  CT of the head and MRI of the brain performed 10/04/2015 FINDINGS: There is no evidence of acute infarction, mass lesion, or intra- or extra-axial hemorrhage on CT. The posterior fossa, including the cerebellum, brainstem and fourth ventricle, is within normal limits. The third and lateral ventricles, and basal ganglia are unremarkable in appearance. The cerebral hemispheres are symmetric in appearance, with normal gray-white differentiation. No mass effect or midline shift is seen. There is no evidence of fracture; visualized osseous structures are unremarkable in appearance. The visualized portions of the orbits are within normal limits. The paranasal sinuses and mastoid air cells are well-aerated. No significant soft tissue abnormalities are seen. IMPRESSION: No evidence of traumatic intracranial injury or fracture. Electronically Signed   By: Garald Balding M.D.   On: 11/11/2015 03:58   Ct Abdomen Pelvis W Contrast  10/14/2015  CLINICAL DATA:  Lumbar spine degenerative changes. EXAM: CT ABDOMEN AND PELVIS WITH CONTRAST TECHNIQUE: Multidetector CT imaging of the abdomen and pelvis was performed using the standard protocol following bolus administration of intravenous contrast. CONTRAST:  126m OMNIPAQUE IOHEXOL 300 MG/ML  SOLN COMPARISON:  CT abdomen pelvis 08/14/2015 FINDINGS: Lower chest: Normal heart size.  Dependent atelectasis within the bilateral lower lobes. No  pleural effusion. Hepatobiliary: Liver is small and nodular in contour compatible with cirrhosis. Patient status post cholecystectomy. No intrahepatic or extrahepatic biliary ductal dilatation. Pancreas: Unremarkable Spleen: Mild splenomegaly measuring 13 cm. Adrenals/Urinary Tract: The adrenal glands are normal. Kidneys enhance symmetrically with contrast. There is a 9 mm nonobstructing stone within the inferior pole of the right kidney. 2 mm nonobstructing stone interpolar region left kidney (image 114; series 6). Bilateral too small to characterize low-attenuation renal lesions. No hydronephrosis. The urinary bladder is unremarkable. Stomach/Bowel: Descending colon is decompressed, limiting evaluation however there is suggestion of wall thickening of the descending and sigmoid colon. No abnormal bowel wall thickening or evidence for bowel obstruction. No free intraperitoneal air. Small hiatal hernia. Normal morphology to the stomach. Vascular/Lymphatic: Normal caliber abdominal aorta. No retroperitoneal lymphadenopathy. Other: Patient status post hysterectomy. There is a moderate amount of ascites throughout the abdomen. Cannot exclude a small amount of peritoneal enhancement, particularly within the pelvis (image 82; series 2). Musculoskeletal: No aggressive or acute appearing osseous lesions. IMPRESSION: Suggestion of wall thickening of the descending and sigmoid colon which is nonspecific and may partially be secondary to decompressed state. Colitis is not excluded. Recommend clinical evaluation. Morphologic changes to the liver compatible with cirrhosis. Moderate volume ascites. Possible mild peritoneal enhancement, particularly within the lower abdomen and pelvis. Peritonitis is not excluded. Mild splenomegaly. Bilateral nonobstructing nephrolithiasis. Electronically Signed   By: Lovey Newcomer M.D.   On: 10/14/2015 19:44   Dg Abd Portable 1v  11/11/2015  CLINICAL DATA:  Four day history of abdominal pain and  emesis EXAM: PORTABLE ABDOMEN - 1 VIEW COMPARISON:  Abdominal radiographs August 04, 2015; CT abdomen and pelvis October 14, 2015 FINDINGS: There is no appreciable bowel dilatation or air-fluid level suggesting obstruction. No free air is seen on this supine examination. There is moderate stool in the colon. There are phleboliths in the pelvis. There is lumbar levoscoliosis. IMPRESSION: No bowel obstruction or free air evident.  Moderate stool in colon. Electronically Signed   By: Lowella Grip III M.D.   On: 11/11/2015 07:50     EKG: (Independently reviewed)  Sinus rhythm with ventricular rate 62 bpm, QTC 516 ms (recurrent prolongation QTC 645 ms on recent EKG in January), elevated J point in inferior lateral leads   Assessment & Plan  Principal Problem:   Syncope due to orthostatic hypotension -Suspect related to dehydration from ongoing nausea and despite patient's protestations I do believe this is related secondarily to her narcotic withdrawal -Admit to telemetry/Obs -Of note patient was profoundly orthostatic upon sitting without any arrhythmias noted on bedside telemetry -Treat underlying causes primarily by depletion secondary to presumed narcotic withdrawal-continue IV fluid at 150 mL per hour -Had echocardiogram January 2017 which revealed moderate LVH and grade 1 diastolic dysfunction without valvular abnormalities and preserved LV function  Active Problems:   Chronic pain syndrome/Narcotic abuse/Narcotic withdrawal  -Suspect patient's nausea directly related to narcotic abuse and also degree of chronic constipation/GI dysmotility from narcotics -Pharmacy med reconciliation: OxyContin/MS Contin 15 mg every 8 hours although outpatient pain center documentation was to administer every 12 hours-for now we'll give every 8 hours until pain clinic can be contacted -CIWA to aid in detoxification -patient having a significant component of anxiety related to lower dosage of narcotic  medications -I did discuss with the patient that due to her contract with the pain clinic we were unable to give her a higher dosage of medications  than previously prescribed by the pain clinic and by doing so we would void her pain contract and she would not be able to continue with the pain clinic -Continue Zanaflex as prior to admission -Appears to have associated chronic constipation only takes Metamucil at home; have started scheduled Colace twice a day as well as scheduled at bedtime MiraLAX -Portable abdominal x-ray ordered (after evaluating the patient) which demonstrates moderate stool in colon -Reglan discontinued (see below)    Diabetes Mellitus 2 on Insulin -Continue preadmission long-acting insulin/Levemir -Initially will closely monitor CBGs every 4 hours and provide SSI -Hemoglobin A1c was 7.5 on 10/14/15    Essential hypertension -Hold preadmission antihypertensive medications in setting of orthostatic hypotension    Chronic anemia -Likely related to chronic disease and suspected degree of iron deficiency -Check anemia panel    Prolonged QT interval -recurrent -Hold offending medications; I have discontinued preadmission Reglan -Patient was evaluated by cardiology during last [facility] admission January 2017; during that admission she had a syncopal episode related to severe bradycardia with documented prolonged QTC therefore her amiodarone was initially decreased and subsequently discontinued (see below) and her beta blocker was decreased. This was also noted to be in the setting of hypokalemia and hypomagnesemia. Prior to discharge bradycardia had resolved and her QTC had normalized. Amlodipine was added for hypertension and it was felt her tachycardia may be related to substance withdrawal. -Cardiology at that time also discussed with EP/Dr. Lovena Le who agreed that the patient was not a candidate for pacemaker -Continue telemetry monitoring    Hypomagnesemia -Mg 1.6 so give 2 gm  IV bolus then begin 400 MG bid -Phosphorus normal    Hypothyroidism due to amiodarone -Continue Synthroid  -TSH 08/01/15 was 61.012  -Repeat TSH 10/04/15 was 22.184 at which time patient's amiodarone was discontinued  -Repeat TSH **TSH 19.435 so ck FreeT4, T3 and Free T3     Paroxysmal atrial fibrillation -Currently maintaining sinus rhythm/sinus bradycardia with prolonged QT as above -Continue eliquis    GERD -Continue PPI     Hyperlipidemia with target LDL less than 100 - not on statin in setting of known cirrhotic disease     Malnutrition of moderate degree -Consider nutrition evaluation this admission  -Preadmission Glucerna shakes on hold until nausea improved     Cryptogenic cirrhosis  -Patient reports secondary to excessive amounts of Tylenol she took while utilizing combination narcotic pain medications -Per med rec patient utilizes Chronulac prn -Continue Xifaxan -No recent issues with ascites -LFTs 10/14/15 were completely normal    H/O TIA (transient ischemic attack) and stroke -Continue low-dose aspirin     DVT Prophylaxis: Eliquis  Family Communication:   No family at bedside  Code Status:  Full code  Condition:  Stable  Discharge disposition: anticipate discharge back to previous home environment  Time spent in minutes : 60      Gwynne Kemnitz L. ANP on 11/11/2015 at 8:20 AM  You may contact me by going to [email or web address] - password [email or web address]  I am available from 7a-7p but please confirm I am on the schedule by going to Amion as above.   After 7p please contact night coverage person covering me after hours  [facility]

## 2015-11-11 NOTE — ED Notes (Signed)
EDP made aware of orthostatics; pt also requesting medications for nausea and pain, MD aware

## 2015-11-12 DIAGNOSIS — J189 Pneumonia, unspecified organism: Secondary | ICD-10-CM | POA: Diagnosis not present

## 2015-11-12 DIAGNOSIS — D649 Anemia, unspecified: Secondary | ICD-10-CM | POA: Diagnosis not present

## 2015-11-12 DIAGNOSIS — G894 Chronic pain syndrome: Secondary | ICD-10-CM | POA: Diagnosis not present

## 2015-11-12 DIAGNOSIS — I951 Orthostatic hypotension: Secondary | ICD-10-CM

## 2015-11-12 LAB — GLUCOSE, CAPILLARY
GLUCOSE-CAPILLARY: 147 mg/dL — AB (ref 65–99)
GLUCOSE-CAPILLARY: 181 mg/dL — AB (ref 65–99)
GLUCOSE-CAPILLARY: 153 mg/dL — AB (ref 65–99)
Glucose-Capillary: 145 mg/dL — ABNORMAL HIGH (ref 65–99)
Glucose-Capillary: 151 mg/dL — ABNORMAL HIGH (ref 65–99)
Glucose-Capillary: 187 mg/dL — ABNORMAL HIGH (ref 65–99)
Glucose-Capillary: 211 mg/dL — ABNORMAL HIGH (ref 65–99)

## 2015-11-12 LAB — CBC
HEMATOCRIT: 35.6 % — AB (ref 36.0–46.0)
HEMOGLOBIN: 11.6 g/dL — AB (ref 12.0–15.0)
MCH: 29 pg (ref 26.0–34.0)
MCHC: 32.6 g/dL (ref 30.0–36.0)
MCV: 89 fL (ref 78.0–100.0)
Platelets: 122 10*3/uL — ABNORMAL LOW (ref 150–400)
RBC: 4 MIL/uL (ref 3.87–5.11)
RDW: 17 % — ABNORMAL HIGH (ref 11.5–15.5)
WBC: 6.1 10*3/uL (ref 4.0–10.5)

## 2015-11-12 LAB — BASIC METABOLIC PANEL
ANION GAP: 7 (ref 5–15)
BUN: 10 mg/dL (ref 6–20)
CALCIUM: 9.1 mg/dL (ref 8.9–10.3)
CO2: 25 mmol/L (ref 22–32)
CREATININE: 0.8 mg/dL (ref 0.44–1.00)
Chloride: 109 mmol/L (ref 101–111)
Glucose, Bld: 147 mg/dL — ABNORMAL HIGH (ref 65–99)
Potassium: 3.7 mmol/L (ref 3.5–5.1)
Sodium: 141 mmol/L (ref 135–145)

## 2015-11-12 LAB — HEMOGLOBIN A1C
HEMOGLOBIN A1C: 8.2 % — AB (ref 4.8–5.6)
Mean Plasma Glucose: 189 mg/dL

## 2015-11-12 LAB — TROPONIN I
Troponin I: <0.03 ng/mL (ref <0.031)
Troponin I: <0.03 ng/mL (ref <0.031)

## 2015-11-12 LAB — T3, FREE: T3, Free: 2.6 pg/mL (ref 2.0–4.4)

## 2015-11-12 LAB — T4, FREE: Free T4: 0.83 ng/dL (ref 0.61–1.12)

## 2015-11-12 LAB — AMMONIA: AMMONIA: 33 umol/L (ref 9–35)

## 2015-11-12 MED ORDER — POLYETHYLENE GLYCOL 3350 17 G PO PACK
17.0000 g | PACK | Freq: Two times a day (BID) | ORAL | Status: DC
  Administered 2015-11-12 – 2015-11-15 (×5): 17 g via ORAL
  Filled 2015-11-12 (×8): qty 1

## 2015-11-12 MED ORDER — MORPHINE SULFATE ER 15 MG PO TBCR
15.0000 mg | EXTENDED_RELEASE_TABLET | Freq: Two times a day (BID) | ORAL | Status: DC
  Administered 2015-11-12 – 2015-11-13 (×2): 15 mg via ORAL
  Filled 2015-11-12 (×2): qty 1

## 2015-11-12 MED ORDER — PANTOPRAZOLE SODIUM 40 MG PO TBEC: 40.0000 mg | DELAYED_RELEASE_TABLET | Freq: Two times a day (BID) | ORAL | Status: DC

## 2015-11-12 MED ORDER — SODIUM CHLORIDE 0.9 % IV SOLN
INTRAVENOUS | Status: DC
  Administered 2015-11-12 – 2015-11-14 (×4): via INTRAVENOUS
  Administered 2015-11-14: 100 mL/h via INTRAVENOUS

## 2015-11-12 NOTE — Progress Notes (Signed)
   11/12/15 0015  Pain Assessment  Pain Assessment 0-10  Pain Score 10  Pain Type Acute pain  Pain Location Chest  Pain Orientation Mid;Left  Pain Descriptors / Indicators Pressure  Pain Frequency Constant  Pain Onset On-going  Patients Stated Pain Goal 0  Pain Intervention(s) MD notified (Comment)  Patient c/o 10/10 chest pain, EKG done, vital signs done, T. Rogue Bussing NP notified and came to assessed the patient, serial troponin ordered prn tramadol IV given per Rogue Bussing NP. Will continue to monitor.

## 2015-11-12 NOTE — Progress Notes (Signed)
[facility] PROGRESS NOTE  [name] YQI:[id number] DOB: Apr 05, 1956 DOA: 11/11/2015 PCP: [place] Pink, MD  Assessment/Plan: [name] is a 60 y.o. female with a Past Medical History of DM, type I, PUD, syncope, GERD, uterine cancer, TIA, HTN, pancreatitis, PAF, CVA, cirrhosis who presents with likely narcotic withdrawal symptoms including possible syncope. Orthostatic positive.    1-Syncope due to orthostatic hypotension Resolved. Now HTN Continue with IV fluids.    2-Chronic pain syndrome/Narcotic abuse/Narcotic withdrawal  Sleepy this am.  Hold morphine if sedated.  Will discontinue schedule ativan.   Chest pain;  Troponin negative.   Diabetes Mellitus 2 on Insulin Continue with levemir.   Essential hypertension -Hold preadmission antihypertensive medications in setting of orthostatic hypotension   Chronic anemia -Likely related to chronic disease and suspected degree of iron deficiency -Check anemia panel  Prolonged QT interval Mg replaced.  Repeat EKG.   Hypomagnesemia -Mg 1.6 so give 2 gm IV bolus then begin 400 MG bid -Phosphorus normal   Hypothyroidism due to amiodarone -Continue Synthroid  -Repeat TSH 10/04/15 was 22.184 at which time patient's amiodarone was discontinued  -Repeat TSH **TSH 19.435 so ck FreeT4, T3 and Free T3  -free T 3 and Free T 4 normal. Continue with synthroid.    Paroxysmal atrial fibrillation -Currently maintaining sinus rhythm/sinus bradycardia with prolonged QT as above -Continue eliquis   GERD -Continue PPI    Hyperlipidemia with target LDL less than 100 - not on statin in setting of known cirrhotic disease    Cryptogenic cirrhosis  -Continue Xifaxan -check ammonia level.    H/O TIA (transient ischemic attack) and stroke -Continue low-dose aspirin   Code Status: full code.  Family Communication: care discussed with patient  Disposition Plan: home in 24 to 48 hours     Consultants:  none  Procedures:  none  Antibiotics:  none  HPI/Subjective: Sleepy, wake up answer some questions.  No more vomiting, didn't complain of chest pain   Objective: Filed Vitals:   11/12/15 0426 11/12/15 0800  BP: 150/76 174/99  Pulse: 89   Temp: 99.6 F (37.6 C)   Resp: 16     Intake/Output Summary (Last 24 hours) at 11/12/15 0830 Last data filed at 11/12/15 0509  Gross per 24 hour  Intake    947 ml  Output   2050 ml  Net  -1103 ml   Filed Weights   11/11/15 1130 11/12/15 0426  Weight: 58.015 kg (127 lb 14.4 oz) 58.741 kg (129 lb 8 oz)    Exam:   General:  NAD  Cardiovascular: S 1, S 2 RRR  Respiratory: CTA  Abdomen: BS present, soft,   Musculoskeletal: no edema  Data Reviewed: Basic Metabolic Panel:  Recent Labs Lab 11/11/15 0150 11/11/15 0745 11/12/15 0632  NA 140  --  141  K 3.9  --  3.7  CL 106  --  109  CO2 23  --  25  GLUCOSE 239*  --  147*  BUN 9  --  10  CREATININE 0.78  --  0.80  CALCIUM 8.8*  --  9.1  MG  --  1.6*  --   PHOS  --  3.0  --    Liver Function Tests: No results for input(s): AST, ALT, ALKPHOS, BILITOT, PROT, ALBUMIN in the last 168 hours. No results for input(s): LIPASE, AMYLASE in the last 168 hours. No results for input(s): AMMONIA in the last 168 hours. CBC:  Recent Labs Lab 11/11/15 0100 11/12/15 3875  WBC 4.1 6.1  HGB 10.2* 11.6*  HCT 31.4* 35.6*  MCV 85.1 89.0  PLT 176 122*   Cardiac Enzymes:  Recent Labs Lab 11/12/15 0028 11/12/15 0632  TROPONINI <0.03 <0.03   BNP (last 3 results)  Recent Labs  01/10/15 2300  BNP 62.5    ProBNP (last 3 results) No results for input(s): PROBNP in the last 8760 hours.  CBG:  Recent Labs Lab 11/11/15 1647 11/11/15 2022 11/12/15 0040 11/12/15 0422 11/12/15 0814  GLUCAP 187* 230* 151* 211* 187*    No results found for this or any previous visit (from the past 240 hour(s)).   Studies: Dg Chest 2 View  11/11/2015  CLINICAL  DATA:  Acute onset of syncope. Generalized weakness and frequent falls. Initial encounter. EXAM: CHEST  2 VIEW COMPARISON:  Chest radiograph performed 10/05/2015 FINDINGS: The lungs are mildly hypoexpanded but appear grossly clear. There is no evidence of focal opacification, pleural effusion or pneumothorax. The heart is normal in size; the mediastinal contour is within normal limits. No acute osseous abnormalities are seen. Clips are noted within the right upper quadrant, reflecting prior cholecystectomy. IMPRESSION: Lungs mildly hypoexpanded but grossly clear. No displaced rib fracture seen. Electronically Signed   By: Garald Balding M.D.   On: 11/11/2015 03:13   Ct Head Wo Contrast  11/11/2015  CLINICAL DATA:  Multiple recent syncopal episodes and falls. Initial encounter. EXAM: CT HEAD WITHOUT CONTRAST TECHNIQUE: Contiguous axial images were obtained from the base of the skull through the vertex without intravenous contrast. COMPARISON:  CT of the head and MRI of the brain performed 10/04/2015 FINDINGS: There is no evidence of acute infarction, mass lesion, or intra- or extra-axial hemorrhage on CT. The posterior fossa, including the cerebellum, brainstem and fourth ventricle, is within normal limits. The third and lateral ventricles, and basal ganglia are unremarkable in appearance. The cerebral hemispheres are symmetric in appearance, with normal gray-white differentiation. No mass effect or midline shift is seen. There is no evidence of fracture; visualized osseous structures are unremarkable in appearance. The visualized portions of the orbits are within normal limits. The paranasal sinuses and mastoid air cells are well-aerated. No significant soft tissue abnormalities are seen. IMPRESSION: No evidence of traumatic intracranial injury or fracture. Electronically Signed   By: Garald Balding M.D.   On: 11/11/2015 03:58   Dg Abd Portable 1v  11/11/2015  CLINICAL DATA:  Four day history of abdominal pain  and emesis EXAM: PORTABLE ABDOMEN - 1 VIEW COMPARISON:  Abdominal radiographs August 04, 2015; CT abdomen and pelvis October 14, 2015 FINDINGS: There is no appreciable bowel dilatation or air-fluid level suggesting obstruction. No free air is seen on this supine examination. There is moderate stool in the colon. There are phleboliths in the pelvis. There is lumbar levoscoliosis. IMPRESSION: No bowel obstruction or free air evident.  Moderate stool in colon. Electronically Signed   By: Lowella Grip III M.D.   On: 11/11/2015 07:50    Scheduled Meds: . amLODipine  5 mg Oral Daily  . apixaban  5 mg Oral BID  . aspirin EC  81 mg Oral Daily  . docusate sodium  100 mg Oral BID  . folic acid  1 mg Oral Daily  . insulin aspart  0-15 Units Subcutaneous 6 times per day  . insulin detemir  17 Units Subcutaneous Daily  . levothyroxine  75 mcg Oral QAC breakfast  . LORazepam  0-4 mg Oral Q6H   Followed by  . [START ON  11/13/2015] LORazepam  0-4 mg Oral Q12H  . magnesium oxide  400 mg Oral BID  . metoprolol tartrate  25 mg Oral BID  . morphine  15 mg Oral Q12H  . multivitamin with minerals  1 tablet Oral Daily  . pantoprazole  40 mg Oral BID  . polyethylene glycol  17 g Oral QHS  . rifaximin  550 mg Oral BID  . sodium chloride flush  3 mL Intravenous Q12H  . thiamine  100 mg Oral Daily   Or  . thiamine  100 mg Intravenous Daily   Continuous Infusions: . sodium chloride 10 mL/hr at 11/11/15 1559    Principal Problem:   Syncope due to orthostatic hypotension Active Problems:   Chronic pain syndrome   GERD   Essential hypertension   Hyperlipidemia with target LDL less than 100   Chronic anemia   Prolonged QT interval   Narcotic abuse   Hypothyroidism due to amiodarone   Malnutrition of moderate degree   Cryptogenic cirrhosis (HCC)   Paroxysmal atrial fibrillation (HCC)   H/O TIA (transient ischemic attack) and stroke   Narcotic withdrawal ([place])   Diabetes mellitus type 2, insulin  dependent ([place])   Orthostatic hypotension    Time spent: 25 minutes.     Niel Hummer A  [facility] Pager [phone]. If 7PM-7AM, please contact night-coverage at [email or web address], password [facility] 11/12/2015, 8:30 AM

## 2015-11-12 NOTE — Evaluation (Signed)
Physical Therapy Evaluation Patient Details Name: [name] MRN: [id number] DOB: Mar 16, 1956 Today's Date: 11/12/2015   History of Present Illness  [name] is a 60 y.o. female who presents to the Emergency Department complaining of sudden onset, syncope, onset about three days ago. Pt states that she has passed out about 12 times since her symptoms began. Pt has hit her head several times during her syncope and reports she lost control of bowels during one syncopal episode. She reports associated chest pain, nausea, loss of appetite, and headache. No worsening or alleviating factors noted. Pt denies fever, vomiting, abdominal pain, or any other pertinent symptoms.   Clinical Impression  Pt admitted with above diagnosis. Pt currently with functional limitations due to the deficits listed below (see PT Problem List). Pt was able to ambulate with RW with fair safety with cues for sequencing.  Husband assist at all times at home per chart.  Should be able to go home with 24 hour assist and HHPT.  Will follow acutely.  Pt will benefit from skilled PT to increase their independence and safety with mobility to allow discharge to the venue listed below.    Follow Up Recommendations Home health PT;Supervision/Assistance - 24 hour    Equipment Recommendations  None recommended by PT    Recommendations for Other Services       Precautions / Restrictions Precautions Precautions: Fall Restrictions Weight Bearing Restrictions: No      Mobility  Bed Mobility Overal bed mobility: Independent                Transfers Overall transfer level: Needs assistance Equipment used: Rolling walker (2 wheeled) Transfers: Sit to/from Stand Sit to Stand: Min guard         General transfer comment: steadying asssit needed for balance.  Ambulation/Gait Ambulation/Gait assistance: Min guard;Min assist Ambulation Distance (Feet): 200 Feet Assistive device: Rolling walker (2 wheeled) Gait  Pattern/deviations: Step-through pattern;Decreased stride length;Drifts right/left;Trunk flexed;Narrow base of support   Gait velocity interpretation: Below normal speed for age/gender General Gait Details: Pt ambulates with RW with assist to steer RW.  Needs constant cues for safety.  At home, husband assists to ambulate per chart holding onto pt with HHA.  Pt generally unsteady needing min assist for balance.   Stairs            Wheelchair Mobility    Modified Rankin (Stroke Patients Only)       Balance Overall balance assessment: Needs assistance;History of Falls Sitting-balance support: No upper extremity supported;Feet supported Sitting balance-Leahy Scale: Fair     Standing balance support: Bilateral upper extremity supported;During functional activity Standing balance-Leahy Scale: Poor Standing balance comment: relies on UE suppport for balance                             Pertinent Vitals/Pain Pain Assessment: 0-10 Pain Score: 10-Worst pain ever Pain Location: right rib area Pain Descriptors / Indicators: Aching;Grimacing;Guarding;Sore Pain Intervention(s): Limited activity within patient's tolerance;Monitored during session;Repositioned  BP in sitting 174/99 with HR 121.  BP in standing 153/68 with HR 122 bpm.     Home Living Family/patient expects to be discharged to:: Private residence Living Arrangements: Spouse/significant other;Children Available Help at Discharge: Family;Available 24 hours/day Type of Home: Mobile home Home Access: Ramped entrance     Home Layout: One level Home Equipment: [place] - 2 wheels;Cane - single point;Bedside commode Additional Comments: walker cannot fit down hallway of  mobile home    Prior Function Level of Independence: Needs assistance   Gait / Transfers Assistance Needed: HHA by husband from bedroom to chair in living room.   ADL's / Homemaking Assistance Needed: Reports assist provided with meals, bathing,  dressing, transfers and bed mobility  Comments: Reports that she does not use any devices with ambulation, holds onto husband.      Hand Dominance   Dominant Hand: Right    Extremity/Trunk Assessment   Upper Extremity Assessment: Defer to OT evaluation           Lower Extremity Assessment: Generalized weakness      Cervical / Trunk Assessment: Kyphotic  Communication   Communication: Expressive difficulties (stuttering)  Cognition Arousal/Alertness: Lethargic;Suspect due to medications Behavior During Therapy: Flat affect Overall Cognitive Status: History of cognitive impairments - at baseline       Memory: Decreased short-term memory              General Comments General comments (skin integrity, edema, etc.): Assisted pt with breakfast as it was in room. Pt kept falling asleep mid sentence so nursing student stayed with her to assist her to finish breakfast.     Exercises General Exercises - Lower Extremity Ankle Circles/Pumps: AROM;Both;5 reps;Seated Long Arc Quad: AROM;Both;5 reps;Seated      Assessment/Plan    PT Assessment Patient needs continued PT services  PT Diagnosis Generalized weakness   PT Problem List Decreased mobility;Decreased balance;Decreased activity tolerance;Decreased knowledge of use of DME;Decreased safety awareness;Decreased knowledge of precautions;Pain  PT Treatment Interventions DME instruction;Gait training;Functional mobility training;Therapeutic activities;Therapeutic exercise;Balance training;Patient/family education   PT Goals (Current goals can be found in the Care Plan section) Acute Rehab PT Goals Patient Stated Goal: to get better PT Goal Formulation: With patient Time For Goal Achievement: 11/26/15 Potential to Achieve Goals: Good    Frequency Min 3X/week   Barriers to discharge        Co-evaluation               End of Session Equipment Utilized During Treatment: Gait belt Activity Tolerance: Patient  limited by fatigue Patient left: with call bell/phone within reach;in bed;with bed alarm set;with nursing/sitter in room Nurse Communication: Mobility status    Functional Assessment Tool Used: clinical judgment Functional Limitation: Mobility: Walking and moving around Mobility: Walking and Moving Around Current Status (R8309): At least 1 percent but less than 20 percent impaired, limited or restricted Mobility: Walking and Moving Around Goal Status [phone]): At least 1 percent but less than 20 percent impaired, limited or restricted    Time: 0757-0833 PT Time Calculation (min) (ACUTE ONLY): 36 min   Charges:   PT Evaluation $PT Eval Moderate Complexity: 1 Procedure PT Treatments $Gait Training: 8-22 mins   PT G Codes:   PT G-Codes **NOT FOR INPATIENT CLASS** Functional Assessment Tool Used: clinical judgment Functional Limitation: Mobility: Walking and moving around Mobility: Walking and Moving Around Current Status (G8811): At least 1 percent but less than 20 percent impaired, limited or restricted Mobility: Walking and Moving Around Goal Status [phone]): At least 1 percent but less than 20 percent impaired, limited or restricted    Irwin Brakeman F 11/12/2015, 9:21 AM [facility] [phone] (pager)

## 2015-11-12 NOTE — Progress Notes (Signed)
[facility] progress note. Chief complaint. Chest pain. History of present illness. This 60 year old female admitted with syncope due to orthostatic hypotension. She has a history of chronic pain with narcotic abuse and narcotic withdrawal. Patient has a history of atrial fib and hypertension but no listed history of coronary artery disease. She Complained to nursing of central chest pain. A 12-lead EKG was obtained indicating sinus tachycardia with nonspecific ST changes. EKG does not look acutely ischemic at this time. Patient states she is having central chest pain without radiation, diaphoresis, or nausea. I came to see the patient at bedside and found her alert and in no distress. Physical exam. Vital signs. Temperature 98.4, pulse 90, respiration 18, blood pressure 124/75. O2 sats 100%. General appearance. Frail elderly female who is alert and in no distress. Cardiac. Tachycardic but regular. Lungs. Breath sounds clear. Abdomen. Soft with positive bowel sounds. Musculoskeletal. I am able to reproduce patient's chest pain with palpation over the sternal area. Impression/plan. Problem #1. Chest pain. No indication per EKG of acute ischemia. Will continue with Toradol for pain. We'll obtain troponin now and then every 6 hours for a total of 3 sets. Will recheck an EKG in approximately 6 hours to evaluate for any changes

## 2015-11-13 ENCOUNTER — Observation Stay (HOSPITAL_COMMUNITY): Payer: Commercial Managed Care - HMO

## 2015-11-13 DIAGNOSIS — R0781 Pleurodynia: Secondary | ICD-10-CM | POA: Diagnosis not present

## 2015-11-13 LAB — URINALYSIS, ROUTINE W REFLEX MICROSCOPIC
Bilirubin Urine: NEGATIVE
GLUCOSE, UA: NEGATIVE mg/dL
Ketones, ur: NEGATIVE mg/dL
Leukocytes, UA: NEGATIVE
NITRITE: NEGATIVE
PH: 7 (ref 5.0–8.0)
Protein, ur: NEGATIVE mg/dL
Specific Gravity, Urine: 1.009 (ref 1.005–1.030)

## 2015-11-13 LAB — HEPATIC FUNCTION PANEL
ALK PHOS: 100 U/L (ref 38–126)
ALT: 20 U/L (ref 14–54)
AST: 22 U/L (ref 15–41)
Albumin: 2.2 g/dL — ABNORMAL LOW (ref 3.5–5.0)
BILIRUBIN DIRECT: 0.4 mg/dL (ref 0.1–0.5)
BILIRUBIN INDIRECT: 0.9 mg/dL (ref 0.3–0.9)
BILIRUBIN TOTAL: 1.3 mg/dL — AB (ref 0.3–1.2)
Total Protein: 5.3 g/dL — ABNORMAL LOW (ref 6.5–8.1)

## 2015-11-13 LAB — CBC
HCT: 30.6 % — ABNORMAL LOW (ref 36.0–46.0)
HEMOGLOBIN: 10.4 g/dL — AB (ref 12.0–15.0)
MCH: 29.7 pg (ref 26.0–34.0)
MCHC: 34 g/dL (ref 30.0–36.0)
MCV: 87.4 fL (ref 78.0–100.0)
PLATELETS: 76 10*3/uL — AB (ref 150–400)
RBC: 3.5 MIL/uL — ABNORMAL LOW (ref 3.87–5.11)
RDW: 16.5 % — AB (ref 11.5–15.5)
WBC: 8.7 10*3/uL (ref 4.0–10.5)

## 2015-11-13 LAB — URINE MICROSCOPIC-ADD ON: WBC UA: NONE SEEN WBC/hpf (ref 0–5)

## 2015-11-13 LAB — GLUCOSE, CAPILLARY
GLUCOSE-CAPILLARY: 261 mg/dL — AB (ref 65–99)
Glucose-Capillary: 138 mg/dL — ABNORMAL HIGH (ref 65–99)
Glucose-Capillary: 181 mg/dL — ABNORMAL HIGH (ref 65–99)
Glucose-Capillary: 213 mg/dL — ABNORMAL HIGH (ref 65–99)

## 2015-11-13 LAB — MAGNESIUM: Magnesium: 1.7 mg/dL (ref 1.7–2.4)

## 2015-11-13 MED ORDER — MORPHINE SULFATE ER 15 MG PO TBCR
15.0000 mg | EXTENDED_RELEASE_TABLET | Freq: Three times a day (TID) | ORAL | Status: DC
  Administered 2015-11-13 – 2015-11-16 (×8): 15 mg via ORAL
  Filled 2015-11-13 (×8): qty 1

## 2015-11-13 MED ORDER — OXYCODONE HCL 5 MG PO TABS
5.0000 mg | ORAL_TABLET | Freq: Four times a day (QID) | ORAL | Status: DC | PRN
  Administered 2015-11-13: 5 mg via ORAL
  Filled 2015-11-13: qty 1

## 2015-11-13 MED ORDER — BISACODYL 10 MG RE SUPP
10.0000 mg | Freq: Once | RECTAL | Status: DC
  Filled 2015-11-13: qty 1

## 2015-11-13 MED ORDER — OXYCODONE HCL 5 MG PO TABS
5.0000 mg | ORAL_TABLET | Freq: Three times a day (TID) | ORAL | Status: DC | PRN
  Administered 2015-11-14: 5 mg via ORAL
  Filled 2015-11-13 (×2): qty 1

## 2015-11-13 MED ORDER — DEXTROSE 5 % IV SOLN
1.0000 g | INTRAVENOUS | Status: DC
  Administered 2015-11-13: 1 g via INTRAVENOUS
  Filled 2015-11-13 (×2): qty 10

## 2015-11-13 NOTE — Progress Notes (Signed)
With low grade temperature . Md aware with order. Pt encou even raged to get oob to chair and do breathing exercise . Refused to be oob

## 2015-11-13 NOTE — Progress Notes (Signed)
[facility] PROGRESS NOTE  [name] TIR:[id number] DOB: 1956/02/25 DOA: 11/11/2015 PCP: [place] Pink, MD  Assessment/Plan: [name] is a 60 y.o. female with a Past Medical History of DM, type I, PUD, syncope, GERD, uterine cancer, TIA, HTN, pancreatitis, PAF, CVA, cirrhosis who presents with likely narcotic withdrawal symptoms including possible syncope. Orthostatic positive.    1-Syncope due to orthostatic hypotension Resolved. Now HTN Continue with IV fluids.   2-Chronic pain syndrome/Narcotic abuse/Narcotic withdrawal  More alert today, complaining of neck pain, chest pain.  Hold morphine if sedated.  discontinue schedule ativan.  Resume MS.   Fever:  Will start ceftriaxone due to her history of cirrhosis.  Check UA, LFT.   Chest pain;  Troponin negative.  Pleuritic. already on eliquis, unlikely PE  Ribs X ray: negative for fracture.  Check LFT , RUQ pain.   Diabetes Mellitus 2 on Insulin Continue with levemir.   Essential hypertension -Hold preadmission antihypertensive medications in setting of orthostatic hypotension   Chronic anemia -Likely related to chronic disease and suspected degree of iron deficiency -Check anemia panel  Prolonged QT interval Mg replaced.  Repeat EKG with improved QT interval.   Hypomagnesemia -Mg 1.6 so give 2 gm IV bolus then begin 400 MG bid -Phosphorus normal   Hypothyroidism due to amiodarone -Continue Synthroid  -Repeat TSH 10/04/15 was 22.184 at which time patient's amiodarone was discontinued  -Repeat TSH **TSH 19.435 so ck FreeT4, T3 and Free T3  -free T 3 and Free T 4 normal. Continue with synthroid.    Paroxysmal atrial fibrillation -Currently maintaining sinus rhythm/sinus bradycardia with prolonged QT as above -Continue eliquis   GERD -Continue PPI    Hyperlipidemia with target LDL less than 100 - not on statin in setting of known cirrhotic disease    Cryptogenic cirrhosis  -Continue  Xifaxan -ammonia level 33   H/O TIA (transient ischemic attack) and stroke -Continue low-dose aspirin   Code Status: full code.  Family Communication: care discussed with patient  Disposition Plan: home in 24 to 48 hours    Consultants:  none  Procedures:  none  Antibiotics:  none  HPI/Subjective: More alert today.  Complaining of her chronic neck pain.  Also complaining of chest pain, RUQ pain.    Objective: Filed Vitals:   11/13/15 1200 11/13/15 1442  BP: 149/71   Pulse: 85   Temp: 100.2 F (37.9 C) 100.4 F (38 C)  Resp: 18     Intake/Output Summary (Last 24 hours) at 11/13/15 1533 Last data filed at 11/13/15 1000  Gross per 24 hour  Intake   1520 ml  Output   1700 ml  Net   -180 ml   Filed Weights   11/11/15 1130 11/12/15 0426 11/13/15 0348  Weight: 58.015 kg (127 lb 14.4 oz) 58.741 kg (129 lb 8 oz) 59.512 kg (131 lb 3.2 oz)    Exam:   General:  NAD  Cardiovascular: S 1, S 2 RRR  Respiratory: CTA  Abdomen: BS present, soft,   Musculoskeletal: no edema  Data Reviewed: Basic Metabolic Panel:  Recent Labs Lab 11/11/15 0150 11/11/15 0745 11/12/15 0632 11/13/15 0355  NA 140  --  141  --   K 3.9  --  3.7  --   CL 106  --  109  --   CO2 23  --  25  --   GLUCOSE 239*  --  147*  --   BUN 9  --  10  --  CREATININE 0.78  --  0.80  --   CALCIUM 8.8*  --  9.1  --   MG  --  1.6*  --  1.7  PHOS  --  3.0  --   --    Liver Function Tests: No results for input(s): AST, ALT, ALKPHOS, BILITOT, PROT, ALBUMIN in the last 168 hours. No results for input(s): LIPASE, AMYLASE in the last 168 hours.  Recent Labs Lab 11/12/15 1513  AMMONIA 33   CBC:  Recent Labs Lab 11/11/15 0100 11/12/15 0632  WBC 4.1 6.1  HGB 10.2* 11.6*  HCT 31.4* 35.6*  MCV 85.1 89.0  PLT 176 122*   Cardiac Enzymes:  Recent Labs Lab 11/12/15 0028 11/12/15 0632 11/12/15 1232  TROPONINI <0.03 <0.03 <0.03   BNP (last 3 results)  Recent Labs   01/10/15 2300  BNP 62.5    ProBNP (last 3 results) No results for input(s): PROBNP in the last 8760 hours.  CBG:  Recent Labs Lab 11/12/15 1633 11/12/15 2023 11/12/15 2348 11/13/15 0350 11/13/15 1214  GLUCAP 181* 153* 145* 138* 181*    No results found for this or any previous visit (from the past 240 hour(s)).   Studies: Dg Ribs Unilateral Right  11/13/2015  CLINICAL DATA:  Syncopal episode, fall 2 days ago, anterior RIGHT lower rib pain since, dyspnea EXAM: RIGHT RIBS - 2 VIEW COMPARISON:  Chest radiographs 11/11/2015 FINDINGS: BB placed at site of symptoms lower anterior RIGHT chest. Bones appear mildly demineralized. No fracture or bone destruction identified. Surgical clips RIGHT upper quadrant likely cholecystectomy. 11 mm RIGHT renal calculus. IMPRESSION: No acute RIGHT rib abnormalities. 11 mm RIGHT renal calculus. Electronically Signed   By: Lavonia Dana M.D.   On: 11/13/2015 13:27    Scheduled Meds: . amLODipine  5 mg Oral Daily  . apixaban  5 mg Oral BID  . aspirin EC  81 mg Oral Daily  . docusate sodium  100 mg Oral BID  . folic acid  1 mg Oral Daily  . insulin aspart  0-15 Units Subcutaneous 6 times per day  . insulin detemir  17 Units Subcutaneous Daily  . levothyroxine  75 mcg Oral QAC breakfast  . magnesium oxide  400 mg Oral BID  . metoprolol tartrate  25 mg Oral BID  . morphine  15 mg Oral 3 times per day  . multivitamin with minerals  1 tablet Oral Daily  . pantoprazole  40 mg Oral BID  . polyethylene glycol  17 g Oral BID  . rifaximin  550 mg Oral BID  . sodium chloride flush  3 mL Intravenous Q12H  . thiamine  100 mg Oral Daily   Or  . thiamine  100 mg Intravenous Daily   Continuous Infusions: . sodium chloride 10 mL/hr at 11/11/15 1559  . sodium chloride 100 mL/hr at 11/13/15 7902    Principal Problem:   Syncope due to orthostatic hypotension Active Problems:   Chronic pain syndrome   GERD   Essential hypertension   Hyperlipidemia with  target LDL less than 100   Chronic anemia   Prolonged QT interval   Narcotic abuse   Hypothyroidism due to amiodarone   Malnutrition of moderate degree   Cryptogenic cirrhosis (HCC)   Paroxysmal atrial fibrillation (HCC)   H/O TIA (transient ischemic attack) and stroke   Narcotic withdrawal ([place])   Diabetes mellitus type 2, insulin dependent (HCC)   Orthostatic hypotension    Time spent: 25 minutes.  Niel Hummer A  [facility] Pager [phone]. If 7PM-7AM, please contact night-coverage at [email or web address], password [facility] 11/13/2015, 3:33 PM

## 2015-11-13 NOTE — Progress Notes (Addendum)
Pt gets very anxious and irritated when not receiving any form of medication. She stated she hated the hospital, because I was unable to give her pain medicine during the night. Zenaflex, scheduled MS Contin and Ativan given during the night, pt seemed to calm down and rest after receiving those medications.

## 2015-11-13 NOTE — Discharge Instructions (Signed)

## 2015-11-14 ENCOUNTER — Observation Stay (HOSPITAL_COMMUNITY): Payer: Commercial Managed Care - HMO

## 2015-11-14 ENCOUNTER — Encounter (HOSPITAL_COMMUNITY): Payer: Self-pay | Admitting: Radiology

## 2015-11-14 DIAGNOSIS — K7469 Other cirrhosis of liver: Secondary | ICD-10-CM | POA: Diagnosis present

## 2015-11-14 DIAGNOSIS — Z8542 Personal history of malignant neoplasm of other parts of uterus: Secondary | ICD-10-CM | POA: Diagnosis not present

## 2015-11-14 DIAGNOSIS — J189 Pneumonia, unspecified organism: Secondary | ICD-10-CM | POA: Diagnosis present

## 2015-11-14 DIAGNOSIS — I480 Paroxysmal atrial fibrillation: Secondary | ICD-10-CM | POA: Diagnosis present

## 2015-11-14 DIAGNOSIS — T462X5A Adverse effect of other antidysrhythmic drugs, initial encounter: Secondary | ICD-10-CM | POA: Diagnosis present

## 2015-11-14 DIAGNOSIS — I951 Orthostatic hypotension: Secondary | ICD-10-CM | POA: Diagnosis present

## 2015-11-14 DIAGNOSIS — R05 Cough: Secondary | ICD-10-CM | POA: Diagnosis not present

## 2015-11-14 DIAGNOSIS — F329 Major depressive disorder, single episode, unspecified: Secondary | ICD-10-CM | POA: Diagnosis present

## 2015-11-14 DIAGNOSIS — Z7982 Long term (current) use of aspirin: Secondary | ICD-10-CM | POA: Diagnosis not present

## 2015-11-14 DIAGNOSIS — Z885 Allergy status to narcotic agent status: Secondary | ICD-10-CM | POA: Diagnosis not present

## 2015-11-14 DIAGNOSIS — R509 Fever, unspecified: Secondary | ICD-10-CM | POA: Diagnosis not present

## 2015-11-14 DIAGNOSIS — Z881 Allergy status to other antibiotic agents status: Secondary | ICD-10-CM | POA: Diagnosis not present

## 2015-11-14 DIAGNOSIS — Z794 Long term (current) use of insulin: Secondary | ICD-10-CM | POA: Diagnosis not present

## 2015-11-14 DIAGNOSIS — I4581 Long QT syndrome: Secondary | ICD-10-CM | POA: Diagnosis present

## 2015-11-14 DIAGNOSIS — E785 Hyperlipidemia, unspecified: Secondary | ICD-10-CM | POA: Diagnosis present

## 2015-11-14 DIAGNOSIS — Z8711 Personal history of peptic ulcer disease: Secondary | ICD-10-CM | POA: Diagnosis not present

## 2015-11-14 DIAGNOSIS — Z887 Allergy status to serum and vaccine status: Secondary | ICD-10-CM | POA: Diagnosis not present

## 2015-11-14 DIAGNOSIS — R739 Hyperglycemia, unspecified: Secondary | ICD-10-CM | POA: Diagnosis present

## 2015-11-14 DIAGNOSIS — Z8673 Personal history of transient ischemic attack (TIA), and cerebral infarction without residual deficits: Secondary | ICD-10-CM | POA: Diagnosis not present

## 2015-11-14 DIAGNOSIS — E440 Moderate protein-calorie malnutrition: Secondary | ICD-10-CM | POA: Diagnosis present

## 2015-11-14 DIAGNOSIS — I10 Essential (primary) hypertension: Secondary | ICD-10-CM | POA: Diagnosis present

## 2015-11-14 DIAGNOSIS — E860 Dehydration: Secondary | ICD-10-CM | POA: Diagnosis present

## 2015-11-14 DIAGNOSIS — S060X1A Concussion with loss of consciousness of 30 minutes or less, initial encounter: Secondary | ICD-10-CM | POA: Diagnosis present

## 2015-11-14 DIAGNOSIS — K5909 Other constipation: Secondary | ICD-10-CM | POA: Diagnosis present

## 2015-11-14 DIAGNOSIS — Z888 Allergy status to other drugs, medicaments and biological substances status: Secondary | ICD-10-CM | POA: Diagnosis not present

## 2015-11-14 DIAGNOSIS — F1123 Opioid dependence with withdrawal: Secondary | ICD-10-CM | POA: Diagnosis present

## 2015-11-14 DIAGNOSIS — E109 Type 1 diabetes mellitus without complications: Secondary | ICD-10-CM | POA: Diagnosis present

## 2015-11-14 DIAGNOSIS — Z79899 Other long term (current) drug therapy: Secondary | ICD-10-CM | POA: Diagnosis not present

## 2015-11-14 DIAGNOSIS — N200 Calculus of kidney: Secondary | ICD-10-CM | POA: Diagnosis not present

## 2015-11-14 DIAGNOSIS — K219 Gastro-esophageal reflux disease without esophagitis: Secondary | ICD-10-CM | POA: Diagnosis present

## 2015-11-14 DIAGNOSIS — F419 Anxiety disorder, unspecified: Secondary | ICD-10-CM | POA: Diagnosis present

## 2015-11-14 DIAGNOSIS — G894 Chronic pain syndrome: Secondary | ICD-10-CM | POA: Diagnosis present

## 2015-11-14 DIAGNOSIS — Z8249 Family history of ischemic heart disease and other diseases of the circulatory system: Secondary | ICD-10-CM | POA: Diagnosis not present

## 2015-11-14 DIAGNOSIS — D649 Anemia, unspecified: Secondary | ICD-10-CM | POA: Diagnosis present

## 2015-11-14 DIAGNOSIS — E032 Hypothyroidism due to medicaments and other exogenous substances: Secondary | ICD-10-CM | POA: Diagnosis present

## 2015-11-14 DIAGNOSIS — R55 Syncope and collapse: Secondary | ICD-10-CM | POA: Diagnosis present

## 2015-11-14 HISTORY — DX: Pneumonia, unspecified organism: J18.9

## 2015-11-14 LAB — GLUCOSE, CAPILLARY
GLUCOSE-CAPILLARY: 185 mg/dL — AB (ref 65–99)
GLUCOSE-CAPILLARY: 159 mg/dL — AB (ref 65–99)
GLUCOSE-CAPILLARY: 169 mg/dL — AB (ref 65–99)
GLUCOSE-CAPILLARY: 194 mg/dL — AB (ref 65–99)
Glucose-Capillary: 231 mg/dL — ABNORMAL HIGH (ref 65–99)
Glucose-Capillary: 247 mg/dL — ABNORMAL HIGH (ref 65–99)

## 2015-11-14 LAB — CBC
HEMATOCRIT: 32.9 % — AB (ref 36.0–46.0)
HEMOGLOBIN: 10.6 g/dL — AB (ref 12.0–15.0)
MCH: 28.1 pg (ref 26.0–34.0)
MCHC: 32.2 g/dL (ref 30.0–36.0)
MCV: 87.3 fL (ref 78.0–100.0)
Platelets: 114 10*3/uL — ABNORMAL LOW (ref 150–400)
RBC: 3.77 MIL/uL — ABNORMAL LOW (ref 3.87–5.11)
RDW: 16.4 % — AB (ref 11.5–15.5)
WBC: 9.4 10*3/uL (ref 4.0–10.5)

## 2015-11-14 LAB — BASIC METABOLIC PANEL
ANION GAP: 6 (ref 5–15)
BUN: 9 mg/dL (ref 6–20)
CHLORIDE: 105 mmol/L (ref 101–111)
CO2: 25 mmol/L (ref 22–32)
Calcium: 7.9 mg/dL — ABNORMAL LOW (ref 8.9–10.3)
Creatinine, Ser: 0.69 mg/dL (ref 0.44–1.00)
GFR calc non Af Amer: >60 mL/min (ref >60)
GLUCOSE: 208 mg/dL — AB (ref 65–99)
Potassium: 3.8 mmol/L (ref 3.5–5.1)
Sodium: 136 mmol/L (ref 135–145)

## 2015-11-14 LAB — T3: T3, Total: 142 ng/dL (ref 71–180)

## 2015-11-14 MED ORDER — SODIUM CHLORIDE 0.9 % IV SOLN
Freq: Once | INTRAVENOUS | Status: AC
Start: 2015-11-14 — End: 2015-11-14
  Administered 2015-11-14: 08:00:00 via INTRAVENOUS

## 2015-11-14 MED ORDER — METOPROLOL TARTRATE 50 MG PO TABS
50.0000 mg | ORAL_TABLET | Freq: Two times a day (BID) | ORAL | Status: DC
  Administered 2015-11-14 – 2015-11-16 (×4): 50 mg via ORAL
  Filled 2015-11-14 (×4): qty 1

## 2015-11-14 MED ORDER — IOHEXOL 300 MG/ML  SOLN
100.0000 mL | Freq: Once | INTRAMUSCULAR | Status: AC | PRN
  Administered 2015-11-14: 100 mL via INTRAVENOUS

## 2015-11-14 MED ORDER — METOPROLOL TARTRATE 1 MG/ML IV SOLN: 5.0000 mg | Freq: Four times a day (QID) | INTRAVENOUS | Status: DC | PRN

## 2015-11-14 MED ORDER — VANCOMYCIN HCL IN DEXTROSE 750-5 MG/150ML-% IV SOLN
750.0000 mg | Freq: Two times a day (BID) | INTRAVENOUS | Status: DC
  Administered 2015-11-14 – 2015-11-15 (×4): 750 mg via INTRAVENOUS
  Filled 2015-11-14 (×9): qty 150

## 2015-11-14 MED ORDER — ACETAMINOPHEN 325 MG PO TABS
650.0000 mg | ORAL_TABLET | Freq: Once | ORAL | Status: AC
  Administered 2015-11-14: 650 mg via ORAL
  Filled 2015-11-14: qty 2

## 2015-11-14 MED ORDER — PIPERACILLIN-TAZOBACTAM 3.375 G IVPB
3.3750 g | Freq: Three times a day (TID) | INTRAVENOUS | Status: DC
  Administered 2015-11-14 – 2015-11-16 (×6): 3.375 g via INTRAVENOUS
  Filled 2015-11-14 (×9): qty 50

## 2015-11-14 MED ORDER — BARIUM SULFATE 2.1 % PO SUSP
450.0000 mL | ORAL | Status: AC
Start: 2015-11-14 — End: 2015-11-14
  Administered 2015-11-14 (×2): 450 mL via ORAL

## 2015-11-14 MED ORDER — INSULIN ASPART 100 UNIT/ML ~~LOC~~ SOLN
0.0000 [IU] | Freq: Three times a day (TID) | SUBCUTANEOUS | Status: DC
  Administered 2015-11-14: 3 [IU] via SUBCUTANEOUS
  Administered 2015-11-15: 5 [IU] via SUBCUTANEOUS
  Administered 2015-11-15: 8 [IU] via SUBCUTANEOUS
  Administered 2015-11-15: 3 [IU] via SUBCUTANEOUS
  Administered 2015-11-16: 5 [IU] via SUBCUTANEOUS

## 2015-11-14 NOTE — Progress Notes (Signed)
ANTIBIOTIC CONSULT NOTE - INITIAL  Pharmacy Consult for Vancomycin/Zosyn Indication: pneumonia  Allergies  Allergen Reactions  . Hydrocodone Other (See Comments)    Patient states "MD says hydrocodone caused the cirrohsis of the liver".  . Erythromycin Base Other (See Comments)    Reaction:  Fever   . Prednisone     unknown  . Rosiglitazone Maleate Swelling  . Codeine Sulfate Rash  . Tetanus-Diphtheria Toxoids Td Rash and Other (See Comments)    Reaction:  Fever     Patient Measurements: Height: 5' 3"  (160 cm) Weight: 130 lb 11.2 oz (59.285 kg) (Scale A) IBW/kg (Calculated) : 52.4  Vital Signs: Temp: 101.1 F (38.4 C) (03/06 0631) Temp Source: Rectal (03/06 0631) BP: 112/73 mmHg (03/06 0437) Pulse Rate: 64 (03/06 0437) Intake/Output from previous day: 03/05 0701 - 03/06 0700 In: 3635 [P.O.:1080; I.V.:2505; IV Piggyback:50] Out: 2850 [Urine:2850] Intake/Output from this shift: Total I/O In: -  Out: 200 [Urine:200]  Labs:  Recent Labs  11/12/15 0632 11/13/15 1552 11/14/15 0308  WBC 6.1 8.7 9.4  HGB 11.6* 10.4* 10.6*  PLT 122* 76* 114*  CREATININE 0.80  --  0.69   Estimated Creatinine Clearance: 61.1 mL/min (by C-G formula based on Cr of 0.69). No results for input(s): VANCOTROUGH, VANCOPEAK, VANCORANDOM, GENTTROUGH, GENTPEAK, GENTRANDOM, TOBRATROUGH, TOBRAPEAK, TOBRARND, AMIKACINPEAK, AMIKACINTROU, AMIKACIN in the last 72 hours.   Microbiology: No results found for this or any previous visit (from the past 720 hour(s)).  Assessment: 60 yo F admitted 11/11/2015 with multiple syncopal episodes, now developed fever, chest X-ray - Left lower lobe pneumonia with small left pleural effusion. Pharmacy is consulted to start vancomycin and zosyn empirically for pneumonia.   Goal of Therapy:  Vancomycin trough level 15-20 mcg/ml  Plan:  Vancomycin 750 mg IV Q 12 hrs Zosyn 3.375 g IV Q 8 hrs Monitor renal function, vancomycin trough at steady state.  Maryanna Shape,  PharmD, BCPS  Clinical Pharmacist  Pager: [phone]   11/14/2015,9:27 AM

## 2015-11-14 NOTE — Progress Notes (Signed)
[facility] PROGRESS NOTE  [name] ZWC:[id number] DOB: 1956-04-12 DOA: 11/11/2015 PCP: Maryland Pink, MD  Assessment/Plan: [name] is a 60 y.o. female with a Past Medical History of DM, type I, PUD, syncope, GERD, uterine cancer, TIA, HTN, pancreatitis, PAF, CVA, cirrhosis who presents with likely narcotic withdrawal symptoms including possible syncope. Orthostatic positive.    1-Syncope due to orthostatic hypotension Resolved. Now HTN Continue with IV fluids.   2-Chronic pain syndrome/Narcotic abuse/Narcotic withdrawal  More alert today, complaining of neck pain, chest pain.  Hold morphine if sedated.  discontinue schedule ativan.  Resume MS.   Fever; secondary to PNA;  She was in a facility last months. Will cover for Health care associated PNA. Will start IV vancomycin and Zosyn.  Chest x ray ; positive for PNA.   Chest pain; Abdominal pain Troponin negative.  Pleuritic. already on eliquis, unlikely PE  Ribs X ray: negative for fracture.  LFT , normal.  RUQ pain suspect related to cirrhosis/  CT abdomen negative for acute pathology/.   Diabetes Mellitus 2 on Insulin Continue with levemir.   Essential hypertension -Hold preadmission antihypertensive medications in setting of orthostatic hypotension   Chronic anemia -Likely related to chronic disease and suspected degree of iron deficiency -Iron: 57, folate 16, B 12;848,  Ferritin 29.   Prolonged QT interval Mg replaced.  Repeat EKG with improved QT interval.   Hypomagnesemia -Mg 1.6 so give 2 gm IV bolus then begin 400 MG bid -Phosphorus normal   Hypothyroidism due to amiodarone -Continue Synthroid  -Repeat TSH 10/04/15 was 22.184 at which time patient's amiodarone was discontinued  -Repeat TSH **TSH 19.435 so ck FreeT4, T3 and Free T3  -free T 3 and Free T 4 normal. Continue with synthroid.    Paroxysmal atrial fibrillation -Continue eliquis -HR elevated this am. Suspect related to  fever.  -increase metoprolol.   GERD -Continue PPI    Hyperlipidemia with target LDL less than 100 - not on statin in setting of known cirrhotic disease    Cryptogenic cirrhosis  -Continue Xifaxan -ammonia level 33   H/O TIA (transient ischemic attack) and stroke -Continue low-dose aspirin   Code Status: full code.  Family Communication: care discussed with patient  Disposition Plan: home in 24 to 48 hours    Consultants:  none  Procedures:  none  Antibiotics:  Vancomycin 3-06  Zosyn 3-06  HPI/Subjective: More alert today.  Complaining of right side ribs pain, upper quadrant abdominal pain.  Spike fever today at 101. HR elevated at 140     Objective: Filed Vitals:   11/14/15 0437 11/14/15 0631  BP: 112/73   Pulse: 64   Temp: 100 F (37.8 C) 101.1 F (38.4 C)  Resp: 16     Intake/Output Summary (Last 24 hours) at 11/14/15 0826 Last data filed at 11/14/15 2353  Gross per 24 hour  Intake   3635 ml  Output   3050 ml  Net    585 ml   Filed Weights   11/12/15 0426 11/13/15 0348 11/14/15 0437  Weight: 58.741 kg (129 lb 8 oz) 59.512 kg (131 lb 3.2 oz) 59.285 kg (130 lb 11.2 oz)    Exam:   General:  NAD  Cardiovascular: S 1, S 2 RRR  Respiratory: CTA  Abdomen: BS present, soft,   Musculoskeletal: no edema  Data Reviewed: Basic Metabolic Panel:  Recent Labs Lab 11/11/15 0150 11/11/15 0745 11/12/15 0632 11/13/15 0355 11/14/15 0308  NA 140  --  141  --  136  K 3.9  --  3.7  --  3.8  CL 106  --  109  --  105  CO2 23  --  25  --  25  GLUCOSE 239*  --  147*  --  208*  BUN 9  --  10  --  9  CREATININE 0.78  --  0.80  --  0.69  CALCIUM 8.8*  --  9.1  --  7.9*  MG  --  1.6*  --  1.7  --   PHOS  --  3.0  --   --   --    Liver Function Tests:  Recent Labs Lab 11/13/15 1552  AST 22  ALT 20  ALKPHOS 100  BILITOT 1.3*  PROT 5.3*  ALBUMIN 2.2*   No results for input(s): LIPASE, AMYLASE in the last 168 hours.  Recent  Labs Lab 11/12/15 1513  AMMONIA 33   CBC:  Recent Labs Lab 11/11/15 0100 11/12/15 0632 11/13/15 1552 11/14/15 0308  WBC 4.1 6.1 8.7 9.4  HGB 10.2* 11.6* 10.4* 10.6*  HCT 31.4* 35.6* 30.6* 32.9*  MCV 85.1 89.0 87.4 87.3  PLT 176 122* 76* 114*   Cardiac Enzymes:  Recent Labs Lab 11/12/15 0028 11/12/15 0632 11/12/15 1232  TROPONINI <0.03 <0.03 <0.03   BNP (last 3 results)  Recent Labs  01/10/15 2300  BNP 62.5    ProBNP (last 3 results) No results for input(s): PROBNP in the last 8760 hours.  CBG:  Recent Labs Lab 11/13/15 1705 11/13/15 2153 11/13/15 2343 11/14/15 0435 11/14/15 0803  GLUCAP 213* 261* 231* 185* 159*    No results found for this or any previous visit (from the past 240 hour(s)).   Studies: Dg Chest 2 View  11/14/2015  CLINICAL DATA:  Several days of cough, onset of fever today, diabetes, cryptogenic cirrhosis, previous CVA. EXAM: CHEST  2 VIEW COMPARISON:  PA and lateral chest x-ray dated November 11, 2015 FINDINGS: There is new infiltrate in the left lower lobe posteriorly. There is small left pleural effusion. The right lung is clear. The heart is top-normal in size. The mediastinum is normal in width. The bony thorax exhibits no acute abnormality. IMPRESSION: Left lower lobe pneumonia with small left pleural effusion. Followup PA and lateral chest X-ray is recommended in 3-4 weeks following trial of antibiotic therapy to ensure resolution and exclude underlying malignancy. Electronically Signed   By: David  Martinique M.D.   On: 11/14/2015 08:02   Dg Ribs Unilateral Right  11/13/2015  CLINICAL DATA:  Syncopal episode, fall 2 days ago, anterior RIGHT lower rib pain since, dyspnea EXAM: RIGHT RIBS - 2 VIEW COMPARISON:  Chest radiographs 11/11/2015 FINDINGS: BB placed at site of symptoms lower anterior RIGHT chest. Bones appear mildly demineralized. No fracture or bone destruction identified. Surgical clips RIGHT upper quadrant likely cholecystectomy. 11 mm  RIGHT renal calculus. IMPRESSION: No acute RIGHT rib abnormalities. 11 mm RIGHT renal calculus. Electronically Signed   By: Lavonia Dana M.D.   On: 11/13/2015 13:27    Scheduled Meds: . apixaban  5 mg Oral BID  . aspirin EC  81 mg Oral Daily  . bisacodyl  10 mg Rectal Once  . docusate sodium  100 mg Oral BID  . folic acid  1 mg Oral Daily  . insulin aspart  0-15 Units Subcutaneous 6 times per day  . insulin detemir  17 Units Subcutaneous Daily  . levothyroxine  75 mcg Oral QAC breakfast  . magnesium oxide  400 mg Oral BID  . metoprolol tartrate  25 mg Oral BID  . morphine  15 mg Oral 3 times per day  . multivitamin with minerals  1 tablet Oral Daily  . pantoprazole  40 mg Oral BID  . polyethylene glycol  17 g Oral BID  . rifaximin  550 mg Oral BID  . sodium chloride flush  3 mL Intravenous Q12H  . thiamine  100 mg Oral Daily   Or  . thiamine  100 mg Intravenous Daily   Continuous Infusions: . sodium chloride 10 mL/hr at 11/11/15 1559  . sodium chloride 125 mL/hr (11/14/15 0740)    Principal Problem:   Syncope due to orthostatic hypotension Active Problems:   Chronic pain syndrome   GERD   Essential hypertension   Hyperlipidemia with target LDL less than 100   Chronic anemia   Prolonged QT interval   Narcotic abuse   Hypothyroidism due to amiodarone   Malnutrition of moderate degree   Cryptogenic cirrhosis (HCC)   Paroxysmal atrial fibrillation (HCC)   H/O TIA (transient ischemic attack) and stroke   Narcotic withdrawal ([place])   Diabetes mellitus type 2, insulin dependent (HCC)   Orthostatic hypotension    Time spent: 25 minutes.     Niel Hummer A  [facility] Pager [phone]. If 7PM-7AM, please contact night-coverage at [email or web address], password [facility] 11/14/2015, 8:26 AM

## 2015-11-14 NOTE — Care Management Obs Status (Signed)
[facility] NOTIFICATION   Patient Details  Name: [name] MRN: [id number] Date of Birth: 1955-09-25   Medicare Observation Status Notification Given:  Yes    Royston Bake, RN 11/14/2015, 3:58 PM

## 2015-11-14 NOTE — Consult Note (Addendum)
   [facility] CM Inpatient Consult   11/14/2015  [name] 1956-01-04 [id number] Patient was assessed for [place] Management for community services. Patient is active with [place] Management.  [facility] Care Management was having difficulty contacting the patient.   Met with patient at bedside regarding being restarted with [facility] services. Consent form active. She endorses her husband as her main contact at [phone].  Patient states the best time to contact her is after 12 noon.  She states her son provides her transportation to her MD appointments.  Chart review reveals that she had been active with [facility] home health.  Of note, [facility] services does not replace or interfere with any services that are arranged by inpatient case management or social work. For additional questions or referrals please contact: Natividad Brood, RN BSN [facility] Liaison  [phone] business mobile phone Toll free office [phone]

## 2015-11-14 NOTE — Progress Notes (Signed)
Pt. With HR sustaining in the 130s-140s. Temp 100.0. On call NP, Fredirick Maudlin, for [facility] made aware.

## 2015-11-14 NOTE — Progress Notes (Signed)
Physical Therapy Treatment Patient Details Name: [name] MRN: [id number] DOB: [birth year] Today's Date: 11/14/2015    History of Present Illness [name] is a 60 y.o. female who presents to the Emergency Department complaining of sudden onset, syncope, onset about three days ago. Pt states that she has passed out about 12 times since her symptoms began. Pt has hit her head several times during her syncope and reports she lost control of bowels during one syncopal episode. She reports associated chest pain, nausea, loss of appetite, and headache. No worsening or alleviating factors noted. Pt denies fever, vomiting, abdominal pain, or any other pertinent symptoms.     PT Comments    Pt notes she is feeling SOB but did not observe this with gait, although she is HD pt and may be feeling fluid today.  Vitals were good with gait and notified nursing of same.  Continue to see for strength and ROM ex, to increase endurance as pt awaits transition home.  Follow Up Recommendations  Home health PT;Supervision/Assistance - 24 hour     Equipment Recommendations  None recommended by PT    Recommendations for Other Services       Precautions / Restrictions Precautions Precautions: Fall Restrictions Weight Bearing Restrictions: No    Mobility  Bed Mobility Overal bed mobility: Modified Independent                Transfers Overall transfer level: Modified independent Equipment used: Rolling walker (2 wheeled) Transfers: Sit to/from [facility] Sit to Stand: Min guard Stand pivot transfers: Min guard          Ambulation/Gait Ambulation/Gait assistance: Min guard Ambulation Distance (Feet): 300 Feet Assistive device: Rolling walker (2 wheeled) Gait Pattern/deviations: Step-through pattern;Wide base of support;Trunk flexed (controlled reciprocal pace)   Gait velocity interpretation: Below normal speed for age/gender     Stairs             Wheelchair Mobility    Modified Rankin (Stroke Patients Only)       Balance     Sitting balance-Leahy Scale: Good       Standing balance-Leahy Scale: Fair                      Cognition Arousal/Alertness: Awake/alert Behavior During Therapy: Flat affect Overall Cognitive Status: History of cognitive impairments - at baseline       Memory: Decreased short-term memory              Exercises      General Comments General comments (skin integrity, edema, etc.): Pt was noted to have normal O2 sats and pulse pre gait of  102 pulse and sat 100%, and post gait was 112 and 98% with 300' gait      Pertinent Vitals/Pain Pain Assessment: 0-10 Pain Score: 6  Pain Location: Upper L chest  Pain Descriptors / Indicators: Aching Pain Intervention(s): Limited activity within patient's tolerance;Premedicated before session    Home Living                      Prior Function            PT Goals (current goals can now be found in the care plan section) Acute Rehab PT Goals Patient Stated Goal: to get better Progress towards PT goals: Progressing toward goals    Frequency  Min 3X/week    PT Plan Current plan remains appropriate    Co-evaluation  End of Session Equipment Utilized During Treatment: Gait belt Activity Tolerance: Patient limited by fatigue Patient left: in bed;with call bell/phone within reach;with nursing/sitter in room;Other (comment) (lab and nursing in to see pt)     Time: 0698-6148 PT Time Calculation (min) (ACUTE ONLY): 25 min  Charges:  $Gait Training: 8-22 mins                    G Codes:      Ramond Dial 2015-11-19, 1:36 PM   Mee Hives, PT MS Acute Rehab Dept. Number: [facility] [phone] and [place] [phone]

## 2015-11-15 LAB — CBC
HCT: 32.9 % — ABNORMAL LOW (ref 36.0–46.0)
HEMOGLOBIN: 10.6 g/dL — AB (ref 12.0–15.0)
MCH: 28.2 pg (ref 26.0–34.0)
MCHC: 32.2 g/dL (ref 30.0–36.0)
MCV: 87.5 fL (ref 78.0–100.0)
Platelets: 125 10*3/uL — ABNORMAL LOW (ref 150–400)
RBC: 3.76 MIL/uL — AB (ref 3.87–5.11)
RDW: 16.4 % — ABNORMAL HIGH (ref 11.5–15.5)
WBC: 7.6 10*3/uL (ref 4.0–10.5)

## 2015-11-15 LAB — GLUCOSE, CAPILLARY
GLUCOSE-CAPILLARY: 194 mg/dL — AB (ref 65–99)
GLUCOSE-CAPILLARY: 160 mg/dL — AB (ref 65–99)
Glucose-Capillary: 269 mg/dL — ABNORMAL HIGH (ref 65–99)
Glucose-Capillary: 206 mg/dL — ABNORMAL HIGH (ref 65–99)

## 2015-11-15 LAB — BASIC METABOLIC PANEL
ANION GAP: 6 (ref 5–15)
BUN: 10 mg/dL (ref 6–20)
CHLORIDE: 106 mmol/L (ref 101–111)
CO2: 26 mmol/L (ref 22–32)
Calcium: 8.2 mg/dL — ABNORMAL LOW (ref 8.9–10.3)
Creatinine, Ser: 0.72 mg/dL (ref 0.44–1.00)
GFR calc non Af Amer: >60 mL/min (ref >60)
Glucose, Bld: 245 mg/dL — ABNORMAL HIGH (ref 65–99)
POTASSIUM: 4.4 mmol/L (ref 3.5–5.1)
SODIUM: 138 mmol/L (ref 135–145)

## 2015-11-15 LAB — URINE CULTURE

## 2015-11-15 NOTE — Progress Notes (Signed)
[facility] PROGRESS NOTE  DORICE STIGGERS UQJ:[id number] DOB: 12/13/1955 DOA: 11/11/2015 PCP: Maryland Pink, MD  Assessment/Plan: [name] is a 60 y.o. female with a Past Medical History of DM, type I, PUD, syncope, GERD, uterine cancer, TIA, HTN, pancreatitis, PAF, CVA, cirrhosis who presents with likely narcotic withdrawal symptoms including possible syncope. Orthostatic positive.   During Hospitalization patient spike fever at 101 on 3-06. Chest x ray was consistent with PNA. She was started on Vancomycin and Zosyn.    1-Syncope due to orthostatic hypotension Resolved. Now HTN Decrease IV fluids.   2-Chronic pain syndrome/Narcotic abuse/Narcotic withdrawal  More alert today, complaining of neck pain, chest pain.  Hold morphine if sedated.  discontinue schedule ativan.  Resume MS.   Fever; secondary to PNA;  She was in a facility last months. Will cover for Health care associated PNA. Continue with  IV vancomycin and Zosyn day 2.   Chest x ray ; positive for PNA.   Chest pain; Abdominal pain Troponin negative.  Pleuritic. already on eliquis, unlikely PE  Ribs X ray: negative for fracture.  LFT , normal.  RUQ pain suspect related to cirrhosis/  CT abdomen negative for acute pathology/.   Diabetes Mellitus 2 on Insulin Continue with levemir.   Essential hypertension -Hold preadmission antihypertensive medications in setting of orthostatic hypotension   Chronic anemia -Likely related to chronic disease and suspected degree of iron deficiency -Iron: 57, folate 16, B 12;848,  Ferritin 29.   Prolonged QT interval Mg replaced.  Repeat EKG with improved QT interval.   Hypomagnesemia -Mg 1.6 so give 2 gm IV bolus then begin 400 MG bid -Phosphorus normal   Hypothyroidism due to amiodarone -Continue Synthroid  -Repeat TSH 10/04/15 was 22.184 at which time patient's amiodarone was discontinued  -Repeat TSH **TSH 19.435 so ck FreeT4, T3 and Free T3  -free T  3 and Free T 4 normal. Continue with synthroid.    Paroxysmal atrial fibrillation -Continue eliquis -HR elevated this am. Suspect related to fever.  -increase metoprolol. -HR improved.    GERD -Continue PPI    Hyperlipidemia with target LDL less than 100 - not on statin in setting of known cirrhotic disease    Cryptogenic cirrhosis  -Continue Xifaxan -ammonia level 33   H/O TIA (transient ischemic attack) and stroke -Continue low-dose aspirin   Code Status: full code.  Family Communication: care discussed with patient  Disposition Plan: home in 24 to 48 hours    Consultants:  none  Procedures:  none  Antibiotics:  Vancomycin 3-06  Zosyn 3-06  HPI/Subjective: She is feeling better, cough improved. No more fever. Chest pain better     Objective: Filed Vitals:   11/15/15 0800 11/15/15 1203  BP: 129/59 135/64  Pulse: 82 87  Temp:  98.8 F (37.1 C)  Resp:  18    Intake/Output Summary (Last 24 hours) at 11/15/15 1436 Last data filed at 11/15/15 1300  Gross per 24 hour  Intake 2982.08 ml  Output   1750 ml  Net 1232.08 ml   Filed Weights   11/13/15 0348 11/14/15 0437 11/15/15 0635  Weight: 59.512 kg (131 lb 3.2 oz) 59.285 kg (130 lb 11.2 oz) 61.508 kg (135 lb 9.6 oz)    Exam:   General:  NAD  Cardiovascular: S 1, S 2 RRR  Respiratory: CTA  Abdomen: BS present, soft,   Musculoskeletal: no edema  Data Reviewed: Basic Metabolic Panel:  Recent Labs Lab 11/11/15 0150 11/11/15 0745 11/12/15 3893  11/13/15 0355 11/14/15 0308 11/15/15 0436  NA 140  --  141  --  136 138  K 3.9  --  3.7  --  3.8 4.4  CL 106  --  109  --  105 106  CO2 23  --  25  --  25 26  GLUCOSE 239*  --  147*  --  208* 245*  BUN 9  --  10  --  9 10  CREATININE 0.78  --  0.80  --  0.69 0.72  CALCIUM 8.8*  --  9.1  --  7.9* 8.2*  MG  --  1.6*  --  1.7  --   --   PHOS  --  3.0  --   --   --   --    Liver Function Tests:  Recent Labs Lab 11/13/15 1552  AST 22   ALT 20  ALKPHOS 100  BILITOT 1.3*  PROT 5.3*  ALBUMIN 2.2*   No results for input(s): LIPASE, AMYLASE in the last 168 hours.  Recent Labs Lab 11/12/15 1513  AMMONIA 33   CBC:  Recent Labs Lab 11/11/15 0100 11/12/15 0632 11/13/15 1552 11/14/15 0308 11/15/15 0436  WBC 4.1 6.1 8.7 9.4 7.6  HGB 10.2* 11.6* 10.4* 10.6* 10.6*  HCT 31.4* 35.6* 30.6* 32.9* 32.9*  MCV 85.1 89.0 87.4 87.3 87.5  PLT 176 122* 76* 114* 125*   Cardiac Enzymes:  Recent Labs Lab 11/12/15 0028 11/12/15 0632 11/12/15 1232  TROPONINI <0.03 <0.03 <0.03   BNP (last 3 results)  Recent Labs  01/10/15 2300  BNP 62.5    ProBNP (last 3 results) No results for input(s): PROBNP in the last 8760 hours.  CBG:  Recent Labs Lab 11/14/15 1142 11/14/15 1624 11/14/15 2213 11/15/15 0635 11/15/15 1119  GLUCAP 169* 194* 247* 194* 269*    Recent Results (from the past 240 hour(s))  Culture, Urine     Status: None   Collection Time: 11/14/15 11:31 AM  Result Value Ref Range Status   Specimen Description URINE, RANDOM  Final   Special Requests NONE  Final   Culture 4,000 COLONIES/mL INSIGNIFICANT GROWTH  Final   Report Status 11/15/2015 FINAL  Final  Culture, blood (routine x 2)     Status: None (Preliminary result)   Collection Time: 11/14/15 11:42 AM  Result Value Ref Range Status   Specimen Description BLOOD LEFT HAND  Final   Special Requests IN PEDIATRIC BOTTLE 3 ML  Final   Culture NO GROWTH 1 DAY  Final   Report Status PENDING  Incomplete  Culture, blood (routine x 2)     Status: None (Preliminary result)   Collection Time: 11/14/15 11:48 AM  Result Value Ref Range Status   Specimen Description BLOOD RIGHT HAND  Final   Special Requests IN PEDIATRIC BOTTLE 3 ML  Final   Culture NO GROWTH 1 DAY  Final   Report Status PENDING  Incomplete     Studies: Dg Chest 2 View  11/14/2015  CLINICAL DATA:  Several days of cough, onset of fever today, diabetes, cryptogenic cirrhosis, previous  CVA. EXAM: CHEST  2 VIEW COMPARISON:  PA and lateral chest x-ray dated November 11, 2015 FINDINGS: There is new infiltrate in the left lower lobe posteriorly. There is small left pleural effusion. The right lung is clear. The heart is top-normal in size. The mediastinum is normal in width. The bony thorax exhibits no acute abnormality. IMPRESSION: Left lower lobe pneumonia with small left pleural effusion.  Followup PA and lateral chest X-ray is recommended in 3-4 weeks following trial of antibiotic therapy to ensure resolution and exclude underlying malignancy. Electronically Signed   By: David  Martinique M.D.   On: 11/14/2015 08:02   Ct Abdomen Pelvis W Contrast  11/14/2015  CLINICAL DATA:  Upper abdominal pain for 1 week. EXAM: CT ABDOMEN AND PELVIS WITH CONTRAST TECHNIQUE: Multidetector CT imaging of the abdomen and pelvis was performed using the standard protocol following bolus administration of intravenous contrast. CONTRAST:  137m OMNIPAQUE IOHEXOL 300 MG/ML  SOLN COMPARISON:  CT scan of October 14, 2015. FINDINGS: Mild left pleural effusion is noted with adjacent subsegmental atelectasis. No significant osseous abnormality is noted. Status post cholecystectomy. Nodular hepatic margins are noted consistent with hepatic cirrhosis. The spleen and pancreas appear normal. Adrenal glands are unremarkable. Nonobstructive calculus is noted in lower pole collecting system of right kidney. Kidneys are otherwise normal. No hydronephrosis or renal obstruction is noted. No ureteral calculi are noted. Atherosclerosis of abdominal aorta is noted without aneurysm formation. Urinary bladder appears normal. Mild amount of free fluid is noted posteriorly in the pelvis. No significant adenopathy is noted. IMPRESSION: Atherosclerosis of abdominal aorta without aneurysm formation. Findings consistent with hepatic cirrhosis. Nonobstructive right renal calculus is noted. No hydronephrosis or renal obstruction is noted. Electronically  Signed   By: JMarijo Conception M.D.   On: 11/14/2015 13:01    Scheduled Meds: . apixaban  5 mg Oral BID  . aspirin EC  81 mg Oral Daily  . bisacodyl  10 mg Rectal Once  . docusate sodium  100 mg Oral BID  . folic acid  1 mg Oral Daily  . insulin aspart  0-15 Units Subcutaneous TID WC  . insulin detemir  17 Units Subcutaneous Daily  . levothyroxine  75 mcg Oral QAC breakfast  . magnesium oxide  400 mg Oral BID  . metoprolol tartrate  50 mg Oral BID  . morphine  15 mg Oral 3 times per day  . multivitamin with minerals  1 tablet Oral Daily  . pantoprazole  40 mg Oral BID  . piperacillin-tazobactam (ZOSYN)  IV  3.375 g Intravenous 3 times per day  . polyethylene glycol  17 g Oral BID  . rifaximin  550 mg Oral BID  . sodium chloride flush  3 mL Intravenous Q12H  . thiamine  100 mg Oral Daily   Or  . thiamine  100 mg Intravenous Daily  . vancomycin  750 mg Intravenous Q12H   Continuous Infusions: . sodium chloride 10 mL/hr at 11/11/15 1559  . sodium chloride 100 mL/hr at 11/14/15 2141    Principal Problem:   Syncope due to orthostatic hypotension Active Problems:   Chronic pain syndrome   GERD   Essential hypertension   Hyperlipidemia with target LDL less than 100   Chronic anemia   Prolonged QT interval   Narcotic abuse   Hypothyroidism due to amiodarone   Malnutrition of moderate degree   Cryptogenic cirrhosis (HCC)   Paroxysmal atrial fibrillation (HCC)   H/O TIA (transient ischemic attack) and stroke   [place]   Diabetes mellitus type 2, insulin dependent [place]   Orthostatic hypotension   Pneumonia    Time spent: 25 minutes.     RNiel HummerA  [facility] Pager [phone] If 7PM-7AM, please contact night-coverage at [email or web address], password [facility]3/03/2016, 2:36 PM  LOS: 1 day

## 2015-11-15 NOTE — Progress Notes (Signed)
Inpatient Diabetes Program Recommendations  AACE/ADA: New Consensus Statement on Inpatient Glycemic Control (2015)  Target Ranges:  Prepandial:   less than 140 mg/dL      Peak postprandial:   less than 180 mg/dL (1-2 hours)      Critically ill patients:  140 - 180 mg/dL   Review of Glycemic Control  Inpatient Diabetes Program Recommendations:  Insulin - Basal: Increase Levemir to 20 units  Correction (SSI): add HS scale per Glycemic Control order-set Thank you  Raoul Pitch BSN, RN,CDE Inpatient Diabetes Coordinator [phone] (team pager)

## 2015-11-16 DIAGNOSIS — J189 Pneumonia, unspecified organism: Principal | ICD-10-CM

## 2015-11-16 LAB — GLUCOSE, CAPILLARY: GLUCOSE-CAPILLARY: 248 mg/dL — AB (ref 65–99)

## 2015-11-16 MED ORDER — MAGNESIUM OXIDE 400 (241.3 MG) MG PO TABS: 400.0000 mg | ORAL_TABLET | Freq: Two times a day (BID) | ORAL | Status: DC

## 2015-11-16 MED ORDER — METOPROLOL TARTRATE 50 MG PO TABS: 50.0000 mg | ORAL_TABLET | Freq: Two times a day (BID) | ORAL | Status: DC

## 2015-11-16 MED ORDER — LEVOFLOXACIN 500 MG PO TABS: 500.0000 mg | ORAL_TABLET | Freq: Every day | ORAL | Status: DC

## 2015-11-16 MED ORDER — INSULIN DETEMIR 100 UNIT/ML ~~LOC~~ SOLN: 17.0000 [IU] | Freq: Every day | SUBCUTANEOUS | Status: DC

## 2015-11-16 NOTE — Progress Notes (Signed)
CM talked to patient about [place] services, patient refused all HHC at this time, CM informed patient that if she changed her mind after discharge, her PCP can make [place] arrangements from the office. Patient is agreeable for the [place] program for Pneumonia, referral made. Mindi Slicker [facility] [phone]

## 2015-11-16 NOTE — Discharge Summary (Signed)
Physician Discharge Summary  [name] ZDG:[id number] DOB: 1955/12/11 DOA: 11/11/2015  PCP: Maryland Pink, MD  Admit date: 11/11/2015 Discharge date: 11/16/2015  Time spent: 35 minutes  Recommendations for Outpatient Follow-up:  1. Needs follow up for resolution of PNA 2. Needs repeat TSH   Discharge Diagnoses:    Syncope due to orthostatic hypotension   Chronic pain syndrome   GERD   Essential hypertension   Hyperlipidemia with target LDL less than 100   Chronic anemia   Prolonged QT interval   Narcotic abuse   Hypothyroidism due to amiodarone   Malnutrition of moderate degree   Cryptogenic cirrhosis (HCC)   Paroxysmal atrial fibrillation (HCC)   H/O TIA (transient ischemic attack) and stroke   Narcotic withdrawal ([place])   Diabetes mellitus type 2, insulin dependent ([place])   Orthostatic hypotension   Pneumonia   Discharge Condition: stable.  Diet recommendation: Heart Healthy  Filed Weights   11/14/15 0437 11/15/15 0635 11/16/15 0612  Weight: 59.285 kg (130 lb 11.2 oz) 61.508 kg (135 lb 9.6 oz) 61.825 kg (136 lb 4.8 oz)    History of present illness:  [name] is a 60 y.o. female with a Past Medical History of DM, type I, PUD, syncope, GERD, uterine cancer, TIA, HTN, pancreatitis, PAF, CVA, cirrhosis who presents with likely narcotic withdrawal symptoms including possible syncope. Orthostatic positive.   During Hospitalization patient spike fever at 101 on 3-06. Chest x ray was consistent with PNA. She was started on Vancomycin and Zosyn.   Hospital Course:  1-Syncope due to orthostatic hypotension Resolved. Now HTN Decrease IV fluids.   2-Chronic pain syndrome/Narcotic abuse/Narcotic withdrawal  More alert today, complaining of neck pain, chest pain.  Hold morphine if sedated.  discontinue schedule ativan.  Resume MS.   Fever; secondary to PNA;  She was in a facility last months. Will cover for Health care associated PNA. Received IV vancomycin  and Zosyn day 3  Chest x ray ; positive for PNA.  Discharge on Levaquin for 5 days.   Chest pain; Abdominal pain Troponin negative.  Pleuritic. already on eliquis, unlikely PE  Ribs X ray: negative for fracture.  LFT , normal. RUQ pain suspect related to cirrhosis/  CT abdomen negative for acute pathology/.   Diabetes Mellitus 2 on Insulin Continue with levemir.   Essential hypertension -Hold preadmission antihypertensive medications in setting of orthostatic hypotension   Chronic anemia -Likely related to chronic disease and suspected degree of iron deficiency -Iron: 57, folate 16, B 12;848, Ferritin 29.   Prolonged QT interval; resolved. Discontinue Reglan at discharge Mg replaced.  Repeat EKG with improved QT interval.   Hypomagnesemia -Mg 1.6 so give 2 gm IV bolus then begin 400 MG bid -Phosphorus normal   Hypothyroidism due to amiodarone -Continue Synthroid  -Repeat TSH 10/04/15 was 22.184 at which time patient's amiodarone was discontinued  -Repeat TSH **TSH 19.435 so ck FreeT4, T3 and Free T3  -free T 3 and Free T 4 normal. Continue with synthroid.    Paroxysmal atrial fibrillation -Continue eliquis -HR elevated this am. Suspect related to fever.  -increase metoprolol. -HR improved.    GERD -Continue PPI    Hyperlipidemia with target LDL less than 100 - not on statin in setting of known cirrhotic disease    Cryptogenic cirrhosis  -Continue Xifaxan -ammonia level 33   H/O TIA (transient ischemic attack) and stroke -Continue low-dose aspirin   Procedures:  none  Consultations:  none  Discharge Exam: Filed Vitals:  11/15/15 2118 11/16/15 0612  BP: 132/74 132/61  Pulse: 80 75  Temp: 98 F (36.7 C) 98.4 F (36.9 C)  Resp: 18 16    General: NAD Cardiovascular: S 1, S 2 RRR Respiratory: CTA  Discharge Instructions   Discharge Instructions    Diet - low sodium heart healthy    Complete by:  As directed      Increase  activity slowly    Complete by:  As directed           Current Discharge Medication List    START taking these medications   Details  levofloxacin (LEVAQUIN) 500 MG tablet Take 1 tablet (500 mg total) by mouth daily. Qty: 5 tablet, Refills: 0    magnesium oxide (MAG-OX) 400 (241.3 Mg) MG tablet Take 1 tablet (400 mg total) by mouth 2 (two) times daily. Qty: 10 tablet, Refills: 0      CONTINUE these medications which have CHANGED   Details  insulin detemir (LEVEMIR) 100 UNIT/ML injection Inject 0.17 mLs (17 Units total) into the skin daily. Qty: 10 mL, Refills: 11    metoprolol (LOPRESSOR) 50 MG tablet Take 1 tablet (50 mg total) by mouth 2 (two) times daily. Qty: 30 tablet, Refills: 0      CONTINUE these medications which have NOT CHANGED   Details  apixaban (ELIQUIS) 5 MG TABS tablet Take 1 tablet (5 mg total) by mouth 2 (two) times daily. Qty: 60 tablet, Refills: 0    aspirin EC 81 MG EC tablet Take 1 tablet (81 mg total) by mouth daily. Qty: 30 tablet, Refills: 3    dicyclomine (BENTYL) 10 MG capsule Take 1 capsule by mouth 4 (four) times daily - after meals and at bedtime.     feeding supplement, GLUCERNA SHAKE, (GLUCERNA SHAKE) LIQD Take 237 mLs by mouth 3 (three) times daily after meals. Qty: 90 Can, Refills: 3    lactulose (CHRONULAC) 10 GM/15ML solution Take 15-45 mLs (10-30 g total) by mouth 2 (two) times daily as needed (P). Qty: 240 mL, Refills: 0    levothyroxine (SYNTHROID, LEVOTHROID) 75 MCG tablet Take 1 tablet (75 mcg total) by mouth daily before breakfast. Qty: 30 tablet, Refills: 1    morphine (MS CONTIN) 15 MG 12 hr tablet Take 1 tablet (15 mg total) by mouth every 8 (eight) hours. Qty: 75 tablet, Refills: 0    pantoprazole (PROTONIX) 40 MG tablet Take 1 tablet (40 mg total) by mouth 2 (two) times daily. Qty: 60 tablet, Refills: 0    rifaximin (XIFAXAN) 550 MG TABS tablet Take 1 tablet (550 mg total) by mouth 2 (two) times daily. Qty: 60 tablet,  Refills: 6    tiZANidine (ZANAFLEX) 4 MG tablet Take 4 mg by mouth 3 (three) times daily as needed for muscle spasms.     zolpidem (AMBIEN) 10 MG tablet Take 10 mg by mouth at bedtime. Reported on 10/04/2015      STOP taking these medications     amLODipine (NORVASC) 5 MG tablet      metoCLOPramide (REGLAN) 5 MG tablet        Allergies  Allergen Reactions  . Hydrocodone Other (See Comments)    Patient states "MD says hydrocodone caused the cirrohsis of the liver".  . Erythromycin Base Other (See Comments)    Reaction:  Fever   . Prednisone     unknown  . Rosiglitazone Maleate Swelling  . Codeine Sulfate Rash  . Tetanus-Diphtheria Toxoids Td Rash and Other (See Comments)  Reaction:  Fever    Follow-up Information    Follow up with Maryland Pink, MD.   Specialty:  Family Medicine   Contact information:   [address] [facility] [place] [address] [phone]        The results of significant diagnostics from this hospitalization (including imaging, microbiology, ancillary and laboratory) are listed below for reference.    Significant Diagnostic Studies: Dg Chest 2 View  11/14/2015  CLINICAL DATA:  Several days of cough, onset of fever today, diabetes, cryptogenic cirrhosis, previous CVA. EXAM: CHEST  2 VIEW COMPARISON:  PA and lateral chest x-ray dated November 11, 2015 FINDINGS: There is new infiltrate in the left lower lobe posteriorly. There is small left pleural effusion. The right lung is clear. The heart is top-normal in size. The mediastinum is normal in width. The bony thorax exhibits no acute abnormality. IMPRESSION: Left lower lobe pneumonia with small left pleural effusion. Followup PA and lateral chest X-ray is recommended in 3-4 weeks following trial of antibiotic therapy to ensure resolution and exclude underlying malignancy. Electronically Signed   By: David  Martinique M.D.   On: 11/14/2015 08:02   Dg Chest 2 View  11/11/2015  CLINICAL DATA:  Acute  onset of syncope. Generalized weakness and frequent falls. Initial encounter. EXAM: CHEST  2 VIEW COMPARISON:  Chest radiograph performed 10/05/2015 FINDINGS: The lungs are mildly hypoexpanded but appear grossly clear. There is no evidence of focal opacification, pleural effusion or pneumothorax. The heart is normal in size; the mediastinal contour is within normal limits. No acute osseous abnormalities are seen. Clips are noted within the right upper quadrant, reflecting prior cholecystectomy. IMPRESSION: Lungs mildly hypoexpanded but grossly clear. No displaced rib fracture seen. Electronically Signed   By: Garald Balding M.D.   On: 11/11/2015 03:13   Dg Ribs Unilateral Right  11/13/2015  CLINICAL DATA:  Syncopal episode, fall 2 days ago, anterior RIGHT lower rib pain since, dyspnea EXAM: RIGHT RIBS - 2 VIEW COMPARISON:  Chest radiographs 11/11/2015 FINDINGS: BB placed at site of symptoms lower anterior RIGHT chest. Bones appear mildly demineralized. No fracture or bone destruction identified. Surgical clips RIGHT upper quadrant likely cholecystectomy. 11 mm RIGHT renal calculus. IMPRESSION: No acute RIGHT rib abnormalities. 11 mm RIGHT renal calculus. Electronically Signed   By: Lavonia Dana M.D.   On: 11/13/2015 13:27   Ct Head Wo Contrast  11/11/2015  CLINICAL DATA:  Multiple recent syncopal episodes and falls. Initial encounter. EXAM: CT HEAD WITHOUT CONTRAST TECHNIQUE: Contiguous axial images were obtained from the base of the skull through the vertex without intravenous contrast. COMPARISON:  CT of the head and MRI of the brain performed 10/04/2015 FINDINGS: There is no evidence of acute infarction, mass lesion, or intra- or extra-axial hemorrhage on CT. The posterior fossa, including the cerebellum, brainstem and fourth ventricle, is within normal limits. The third and lateral ventricles, and basal ganglia are unremarkable in appearance. The cerebral hemispheres are symmetric in appearance, with normal  gray-white differentiation. No mass effect or midline shift is seen. There is no evidence of fracture; visualized osseous structures are unremarkable in appearance. The visualized portions of the orbits are within normal limits. The paranasal sinuses and mastoid air cells are well-aerated. No significant soft tissue abnormalities are seen. IMPRESSION: No evidence of traumatic intracranial injury or fracture. Electronically Signed   By: Garald Balding M.D.   On: 11/11/2015 03:58   Ct Abdomen Pelvis W Contrast  11/14/2015  CLINICAL DATA:  Upper  abdominal pain for 1 week. EXAM: CT ABDOMEN AND PELVIS WITH CONTRAST TECHNIQUE: Multidetector CT imaging of the abdomen and pelvis was performed using the standard protocol following bolus administration of intravenous contrast. CONTRAST:  125m OMNIPAQUE IOHEXOL 300 MG/ML  SOLN COMPARISON:  CT scan of October 14, 2015. FINDINGS: Mild left pleural effusion is noted with adjacent subsegmental atelectasis. No significant osseous abnormality is noted. Status post cholecystectomy. Nodular hepatic margins are noted consistent with hepatic cirrhosis. The spleen and pancreas appear normal. Adrenal glands are unremarkable. Nonobstructive calculus is noted in lower pole collecting system of right kidney. Kidneys are otherwise normal. No hydronephrosis or renal obstruction is noted. No ureteral calculi are noted. Atherosclerosis of abdominal aorta is noted without aneurysm formation. Urinary bladder appears normal. Mild amount of free fluid is noted posteriorly in the pelvis. No significant adenopathy is noted. IMPRESSION: Atherosclerosis of abdominal aorta without aneurysm formation. Findings consistent with hepatic cirrhosis. Nonobstructive right renal calculus is noted. No hydronephrosis or renal obstruction is noted. Electronically Signed   By: JMarijo Conception M.D.   On: 11/14/2015 13:01   Dg Abd Portable 1v  11/11/2015  CLINICAL DATA:  Four day history of abdominal pain and  emesis EXAM: PORTABLE ABDOMEN - 1 VIEW COMPARISON:  Abdominal radiographs August 04, 2015; CT abdomen and pelvis October 14, 2015 FINDINGS: There is no appreciable bowel dilatation or air-fluid level suggesting obstruction. No free air is seen on this supine examination. There is moderate stool in the colon. There are phleboliths in the pelvis. There is lumbar levoscoliosis. IMPRESSION: No bowel obstruction or free air evident.  Moderate stool in colon. Electronically Signed   By: WLowella GripIII M.D.   On: 11/11/2015 07:50    Microbiology: Recent Results (from the past 240 hour(s))  Culture, Urine     Status: None   Collection Time: 11/14/15 11:31 AM  Result Value Ref Range Status   Specimen Description URINE, RANDOM  Final   Special Requests NONE  Final   Culture 4,000 COLONIES/mL INSIGNIFICANT GROWTH  Final   Report Status 11/15/2015 FINAL  Final  Culture, blood (routine x 2)     Status: None (Preliminary result)   Collection Time: 11/14/15 11:42 AM  Result Value Ref Range Status   Specimen Description BLOOD LEFT HAND  Final   Special Requests IN PEDIATRIC BOTTLE 3 ML  Final   Culture NO GROWTH 1 DAY  Final   Report Status PENDING  Incomplete  Culture, blood (routine x 2)     Status: None (Preliminary result)   Collection Time: 11/14/15 11:48 AM  Result Value Ref Range Status   Specimen Description BLOOD RIGHT HAND  Final   Special Requests IN PEDIATRIC BOTTLE 3 ML  Final   Culture NO GROWTH 1 DAY  Final   Report Status PENDING  Incomplete     Labs: Basic Metabolic Panel:  Recent Labs Lab 11/11/15 0150 11/11/15 0745 11/12/15 0632 11/13/15 0355 11/14/15 0308 11/15/15 0436  NA 140  --  141  --  136 138  K 3.9  --  3.7  --  3.8 4.4  CL 106  --  109  --  105 106  CO2 23  --  25  --  25 26  GLUCOSE 239*  --  147*  --  208* 245*  BUN 9  --  10  --  9 10  CREATININE 0.78  --  0.80  --  0.69 0.72  CALCIUM 8.8*  --  9.1  --  7.9* 8.2*  MG  --  1.6*  --  1.7  --   --    PHOS  --  3.0  --   --   --   --    Liver Function Tests:  Recent Labs Lab 11/13/15 1552  AST 22  ALT 20  ALKPHOS 100  BILITOT 1.3*  PROT 5.3*  ALBUMIN 2.2*   No results for input(s): LIPASE, AMYLASE in the last 168 hours.  Recent Labs Lab 11/12/15 1513  AMMONIA 33   CBC:  Recent Labs Lab 11/11/15 0100 11/12/15 0632 11/13/15 1552 11/14/15 0308 11/15/15 0436  WBC 4.1 6.1 8.7 9.4 7.6  HGB 10.2* 11.6* 10.4* 10.6* 10.6*  HCT 31.4* 35.6* 30.6* 32.9* 32.9*  MCV 85.1 89.0 87.4 87.3 87.5  PLT 176 122* 76* 114* 125*   Cardiac Enzymes:  Recent Labs Lab 11/12/15 0028 11/12/15 0632 11/12/15 1232  TROPONINI <0.03 <0.03 <0.03   BNP: BNP (last 3 results)  Recent Labs  01/10/15 2300  BNP 62.5    ProBNP (last 3 results) No results for input(s): PROBNP in the last 8760 hours.  CBG:  Recent Labs Lab 11/15/15 0635 11/15/15 1119 11/15/15 1616 11/15/15 2116 11/16/15 0611  GLUCAP 194* 269* 206* 160* 248*       Signed:  Niel Hummer A MD.  [facility] 11/16/2015, 8:20 AM

## 2015-11-17 ENCOUNTER — Other Ambulatory Visit: Payer: Self-pay | Admitting: *Deleted

## 2015-11-17 NOTE — Progress Notes (Signed)
Late entry on 11/16/15 pt received all d/c instructions that was explained and given to pt.  Son at bedside.  Verbalized understanding.  D/c off floor via w/c to awaiting transport.  [place], [place].

## 2015-11-17 NOTE — Patient Outreach (Signed)
[facility]) Care Management Transition of Care telephone outreach, week 1 11/17/2015  [name] 1956/05/29 [id number]  [name] is a 60 year old female who has two recent inpatient hospital visits.  She was most recently discharged home on November 16, 2015, after being hospitalized for pneumonia and near-syncope.  Previous to the most recent hospitalization, she was hospitalized in late January, and [facility] CM had been unsuccessful in reaching her with telephone call attempts.  Patient had provided written consent for [facility] CM during her January 2017 inpatient hospital visit.  Today, I spoke with her husband who stated, "she has decided she doesn't want to have you all come out to visit her... She just doesn't want anyone coming out to our house."  I explained that we could follow [name] for transition of care needs through telephone outreach only, and Mr. Amoroso said that he would give her the information/ message.  Mr. Isais stated that patient was currently lying down and resting, and stated that he would ask her to call me back when she got up.  I made sure that Mr. Biss had my contact information and let him know that if I did not hear back from [name] this afternoon, I would try again tomorrow.  Plan:  Re-attempt telephone outreach for transition of care tomorrow if I do not hear back from [name] later this afternoon.  [name], RN, BSN, [employer] [facility] Care Management  [phone]

## 2015-11-18 ENCOUNTER — Other Ambulatory Visit: Payer: Self-pay | Admitting: *Deleted

## 2015-11-18 ENCOUNTER — Encounter: Payer: Self-pay | Admitting: *Deleted

## 2015-11-18 NOTE — Patient Outreach (Addendum)
[facility]) Care Management Transition of Care telephone outreach, attempt 2, week 1 11/18/2015  JOORY GOUGH 1955/09/30 [id number]  Successful telephone outreach to Mrs. Amaani Guilbault after her discharge from the hospital on November 16, 2015.  HIPPA verified; however, patient was unable to talk, stating, "I am currently in route to [place], where my mother lives; I just found out that she had a heart attack.... I don't know how long I am going to be out of town.  Please put something in the mail to me with your name and phone number on it, and I will call you back when all of this calms down a little bit."  I offered to call the patient again next week, but she was adamant that she wished to call me when she was ready.  Will put a letter in the mail to her to close case and include my contact information so that she can contact me when she returns home.  Emotional support and encouragement provided to Mrs. Lagasse during our short phone conversation today.  [name], RN, BSN, [employer] [facility] Care Management  [phone]

## 2015-11-19 LAB — CULTURE, BLOOD (ROUTINE X 2)
CULTURE: NO GROWTH
CULTURE: NO GROWTH

## 2015-11-22 DIAGNOSIS — IMO0001 Reserved for inherently not codable concepts without codable children: Secondary | ICD-10-CM | POA: Insufficient documentation

## 2015-11-22 DIAGNOSIS — R112 Nausea with vomiting, unspecified: Secondary | ICD-10-CM | POA: Diagnosis not present

## 2015-11-22 DIAGNOSIS — K7460 Unspecified cirrhosis of liver: Secondary | ICD-10-CM | POA: Diagnosis not present

## 2015-11-22 DIAGNOSIS — Z8673 Personal history of transient ischemic attack (TIA), and cerebral infarction without residual deficits: Secondary | ICD-10-CM | POA: Insufficient documentation

## 2015-11-22 DIAGNOSIS — F1110 Opioid abuse, uncomplicated: Secondary | ICD-10-CM | POA: Diagnosis present

## 2015-11-22 DIAGNOSIS — G8929 Other chronic pain: Secondary | ICD-10-CM | POA: Diagnosis not present

## 2015-11-22 DIAGNOSIS — K7469 Other cirrhosis of liver: Secondary | ICD-10-CM | POA: Diagnosis not present

## 2015-11-22 DIAGNOSIS — R197 Diarrhea, unspecified: Secondary | ICD-10-CM | POA: Diagnosis not present

## 2015-11-22 DIAGNOSIS — R188 Other ascites: Secondary | ICD-10-CM | POA: Diagnosis not present

## 2015-11-22 DIAGNOSIS — K2930 Chronic superficial gastritis without bleeding: Secondary | ICD-10-CM | POA: Insufficient documentation

## 2015-11-22 DIAGNOSIS — E860 Dehydration: Secondary | ICD-10-CM | POA: Diagnosis not present

## 2015-11-22 DIAGNOSIS — K5730 Diverticulosis of large intestine without perforation or abscess without bleeding: Secondary | ICD-10-CM | POA: Diagnosis not present

## 2015-11-22 DIAGNOSIS — R109 Unspecified abdominal pain: Secondary | ICD-10-CM | POA: Diagnosis not present

## 2015-11-22 DIAGNOSIS — I10 Essential (primary) hypertension: Secondary | ICD-10-CM | POA: Diagnosis not present

## 2015-11-22 DIAGNOSIS — I4891 Unspecified atrial fibrillation: Secondary | ICD-10-CM | POA: Diagnosis not present

## 2015-11-22 DIAGNOSIS — D649 Anemia, unspecified: Secondary | ICD-10-CM | POA: Diagnosis not present

## 2015-11-22 DIAGNOSIS — N200 Calculus of kidney: Secondary | ICD-10-CM | POA: Diagnosis not present

## 2015-11-22 DIAGNOSIS — Z8542 Personal history of malignant neoplasm of other parts of uterus: Secondary | ICD-10-CM | POA: Insufficient documentation

## 2015-11-22 DIAGNOSIS — I480 Paroxysmal atrial fibrillation: Secondary | ICD-10-CM | POA: Diagnosis not present

## 2015-11-22 DIAGNOSIS — E039 Hypothyroidism, unspecified: Secondary | ICD-10-CM | POA: Insufficient documentation

## 2015-11-22 DIAGNOSIS — E872 Acidosis: Secondary | ICD-10-CM | POA: Diagnosis not present

## 2015-11-22 DIAGNOSIS — K5790 Diverticulosis of intestine, part unspecified, without perforation or abscess without bleeding: Secondary | ICD-10-CM | POA: Insufficient documentation

## 2015-11-22 DIAGNOSIS — E782 Mixed hyperlipidemia: Secondary | ICD-10-CM | POA: Insufficient documentation

## 2015-11-22 DIAGNOSIS — E1143 Type 2 diabetes mellitus with diabetic autonomic (poly)neuropathy: Secondary | ICD-10-CM | POA: Diagnosis not present

## 2015-11-22 DIAGNOSIS — R1115 Cyclical vomiting syndrome unrelated to migraine: Secondary | ICD-10-CM | POA: Insufficient documentation

## 2015-11-22 DIAGNOSIS — R1013 Epigastric pain: Secondary | ICD-10-CM | POA: Diagnosis not present

## 2015-11-22 DIAGNOSIS — K3184 Gastroparesis: Secondary | ICD-10-CM | POA: Diagnosis not present

## 2015-11-22 DIAGNOSIS — G43A1 Cyclical vomiting, intractable: Secondary | ICD-10-CM | POA: Diagnosis not present

## 2015-11-22 DIAGNOSIS — E1149 Type 2 diabetes mellitus with other diabetic neurological complication: Secondary | ICD-10-CM | POA: Diagnosis not present

## 2015-11-23 DIAGNOSIS — Z9049 Acquired absence of other specified parts of digestive tract: Secondary | ICD-10-CM | POA: Insufficient documentation

## 2015-11-23 DIAGNOSIS — E1149 Type 2 diabetes mellitus with other diabetic neurological complication: Secondary | ICD-10-CM | POA: Insufficient documentation

## 2015-11-24 DIAGNOSIS — I480 Paroxysmal atrial fibrillation: Secondary | ICD-10-CM | POA: Insufficient documentation

## 2015-12-08 ENCOUNTER — Encounter: Payer: Self-pay | Admitting: *Deleted

## 2015-12-08 ENCOUNTER — Other Ambulatory Visit: Payer: Self-pay | Admitting: *Deleted

## 2015-12-08 NOTE — Patient Outreach (Signed)
[facility]) Care Management  12/08/2015  KHIRA CUDMORE 11/07/1955 [id number]  Telephone outreach to Laurene Footman, [place] with [facility], who sent routine referral for [facility] outreach attempt to [facility].  Left detailed VM msg for Ms. Jennet Maduro, explaining that [facility] had made numerous unsuccessful attempts to engage pt. In [facility] services/ participation.  Ms. Rae Mar returned my call this afternoon and confirmed that if patient did not wish to engage with [facility] services with numerous recent unsuccessful attempts, there was no need to attempt to re-contact her.  I confirmed that the patient outreach close letter had been sent to patient and to her PCP.  [name], RN, BSN, [employer] [facility] Care Management  [phone]

## 2015-12-16 ENCOUNTER — Emergency Department: Payer: Commercial Managed Care - HMO

## 2015-12-16 ENCOUNTER — Inpatient Hospital Stay
Admission: EM | Admit: 2015-12-16 | Discharge: 2015-12-19 | DRG: 392 | Disposition: A | Discharge status: Home / Self Care | Payer: Commercial Managed Care - HMO | Attending: Internal Medicine | Admitting: Internal Medicine

## 2015-12-16 ENCOUNTER — Encounter: Payer: Self-pay | Admitting: Emergency Medicine

## 2015-12-16 DIAGNOSIS — R05 Cough: Secondary | ICD-10-CM | POA: Diagnosis not present

## 2015-12-16 DIAGNOSIS — K529 Noninfective gastroenteritis and colitis, unspecified: Secondary | ICD-10-CM | POA: Diagnosis not present

## 2015-12-16 DIAGNOSIS — G894 Chronic pain syndrome: Secondary | ICD-10-CM | POA: Diagnosis not present

## 2015-12-16 DIAGNOSIS — Z8673 Personal history of transient ischemic attack (TIA), and cerebral infarction without residual deficits: Secondary | ICD-10-CM

## 2015-12-16 DIAGNOSIS — R918 Other nonspecific abnormal finding of lung field: Secondary | ICD-10-CM | POA: Diagnosis not present

## 2015-12-16 DIAGNOSIS — I480 Paroxysmal atrial fibrillation: Secondary | ICD-10-CM | POA: Diagnosis not present

## 2015-12-16 DIAGNOSIS — M549 Dorsalgia, unspecified: Secondary | ICD-10-CM | POA: Diagnosis present

## 2015-12-16 DIAGNOSIS — J189 Pneumonia, unspecified organism: Secondary | ICD-10-CM

## 2015-12-16 DIAGNOSIS — R1084 Generalized abdominal pain: Secondary | ICD-10-CM | POA: Diagnosis not present

## 2015-12-16 DIAGNOSIS — E1043 Type 1 diabetes mellitus with diabetic autonomic (poly)neuropathy: Secondary | ICD-10-CM | POA: Diagnosis present

## 2015-12-16 DIAGNOSIS — Z9981 Dependence on supplemental oxygen: Secondary | ICD-10-CM | POA: Diagnosis not present

## 2015-12-16 DIAGNOSIS — Z7982 Long term (current) use of aspirin: Secondary | ICD-10-CM | POA: Diagnosis not present

## 2015-12-16 DIAGNOSIS — G8929 Other chronic pain: Secondary | ICD-10-CM | POA: Diagnosis present

## 2015-12-16 DIAGNOSIS — D696 Thrombocytopenia, unspecified: Secondary | ICD-10-CM | POA: Diagnosis present

## 2015-12-16 DIAGNOSIS — K3189 Other diseases of stomach and duodenum: Secondary | ICD-10-CM | POA: Diagnosis not present

## 2015-12-16 DIAGNOSIS — E039 Hypothyroidism, unspecified: Secondary | ICD-10-CM | POA: Diagnosis present

## 2015-12-16 DIAGNOSIS — F419 Anxiety disorder, unspecified: Secondary | ICD-10-CM | POA: Diagnosis present

## 2015-12-16 DIAGNOSIS — R197 Diarrhea, unspecified: Secondary | ICD-10-CM | POA: Diagnosis not present

## 2015-12-16 DIAGNOSIS — Z66 Do not resuscitate: Secondary | ICD-10-CM | POA: Diagnosis present

## 2015-12-16 DIAGNOSIS — K7460 Unspecified cirrhosis of liver: Secondary | ICD-10-CM | POA: Diagnosis not present

## 2015-12-16 DIAGNOSIS — K219 Gastro-esophageal reflux disease without esophagitis: Secondary | ICD-10-CM | POA: Diagnosis present

## 2015-12-16 DIAGNOSIS — I4891 Unspecified atrial fibrillation: Secondary | ICD-10-CM | POA: Diagnosis not present

## 2015-12-16 DIAGNOSIS — K3184 Gastroparesis: Secondary | ICD-10-CM | POA: Diagnosis present

## 2015-12-16 DIAGNOSIS — Z8701 Personal history of pneumonia (recurrent): Secondary | ICD-10-CM | POA: Diagnosis not present

## 2015-12-16 DIAGNOSIS — E1065 Type 1 diabetes mellitus with hyperglycemia: Secondary | ICD-10-CM | POA: Diagnosis not present

## 2015-12-16 DIAGNOSIS — Z794 Long term (current) use of insulin: Secondary | ICD-10-CM

## 2015-12-16 DIAGNOSIS — K2970 Gastritis, unspecified, without bleeding: Secondary | ICD-10-CM | POA: Diagnosis not present

## 2015-12-16 DIAGNOSIS — R062 Wheezing: Secondary | ICD-10-CM | POA: Diagnosis not present

## 2015-12-16 DIAGNOSIS — K766 Portal hypertension: Secondary | ICD-10-CM | POA: Diagnosis not present

## 2015-12-16 DIAGNOSIS — Z8542 Personal history of malignant neoplasm of other parts of uterus: Secondary | ICD-10-CM | POA: Diagnosis not present

## 2015-12-16 DIAGNOSIS — Z7901 Long term (current) use of anticoagulants: Secondary | ICD-10-CM | POA: Diagnosis not present

## 2015-12-16 DIAGNOSIS — R059 Cough, unspecified: Secondary | ICD-10-CM

## 2015-12-16 DIAGNOSIS — R1013 Epigastric pain: Secondary | ICD-10-CM | POA: Diagnosis not present

## 2015-12-16 DIAGNOSIS — Z9049 Acquired absence of other specified parts of digestive tract: Secondary | ICD-10-CM

## 2015-12-16 DIAGNOSIS — I10 Essential (primary) hypertension: Secondary | ICD-10-CM | POA: Diagnosis present

## 2015-12-16 DIAGNOSIS — R112 Nausea with vomiting, unspecified: Secondary | ICD-10-CM | POA: Diagnosis not present

## 2015-12-16 LAB — URINALYSIS COMPLETE WITH MICROSCOPIC (ARMC ONLY)
BILIRUBIN URINE: NEGATIVE
LEUKOCYTES UA: NEGATIVE
NITRITE: NEGATIVE
Protein, ur: NEGATIVE mg/dL
Specific Gravity, Urine: 1.025 (ref 1.005–1.030)
pH: 6 (ref 5.0–8.0)

## 2015-12-16 LAB — GASTROINTESTINAL PANEL BY PCR, STOOL (REPLACES STOOL CULTURE)
Adenovirus F40/41: NOT DETECTED
Astrovirus: NOT DETECTED
CAMPYLOBACTER SPECIES: NOT DETECTED
CRYPTOSPORIDIUM: NOT DETECTED
Cyclospora cayetanensis: NOT DETECTED
E. coli O157: NOT DETECTED
ENTEROAGGREGATIVE E COLI (EAEC): NOT DETECTED
Entamoeba histolytica: NOT DETECTED
Enteropathogenic E coli (EPEC): NOT DETECTED
Enterotoxigenic E coli (ETEC): NOT DETECTED
GIARDIA LAMBLIA: NOT DETECTED
Norovirus GI/GII: NOT DETECTED
PLESIMONAS SHIGELLOIDES: NOT DETECTED
ROTAVIRUS A: NOT DETECTED
SALMONELLA SPECIES: NOT DETECTED
SHIGELLA/ENTEROINVASIVE E COLI (EIEC): NOT DETECTED
Sapovirus (I, II, IV, and V): NOT DETECTED
Shiga like toxin producing E coli (STEC): NOT DETECTED
Vibrio cholerae: NOT DETECTED
Vibrio species: NOT DETECTED
YERSINIA ENTEROCOLITICA: NOT DETECTED

## 2015-12-16 LAB — CBC
HCT: 34.5 % — ABNORMAL LOW (ref 35.0–47.0)
Hemoglobin: 11.4 g/dL — ABNORMAL LOW (ref 12.0–16.0)
MCH: 29 pg (ref 26.0–34.0)
MCHC: 33.1 g/dL (ref 32.0–36.0)
MCV: 87.6 fL (ref 80.0–100.0)
PLATELETS: 135 10*3/uL — AB (ref 150–440)
RBC: 3.94 MIL/uL (ref 3.80–5.20)
RDW: 16.7 % — ABNORMAL HIGH (ref 11.5–14.5)
WBC: 7.9 10*3/uL (ref 3.6–11.0)

## 2015-12-16 LAB — C DIFFICILE QUICK SCREEN W PCR REFLEX
C DIFFICILE (CDIFF) INTERP: NEGATIVE
C DIFFICILE (CDIFF) TOXIN: NEGATIVE
C Diff antigen: NEGATIVE

## 2015-12-16 LAB — COMPREHENSIVE METABOLIC PANEL
ALBUMIN: 3.7 g/dL (ref 3.5–5.0)
ALK PHOS: 134 U/L — AB (ref 38–126)
ALT: 32 U/L (ref 14–54)
ANION GAP: 10 (ref 5–15)
AST: 56 U/L — ABNORMAL HIGH (ref 15–41)
BILIRUBIN TOTAL: 1.7 mg/dL — AB (ref 0.3–1.2)
BUN: 15 mg/dL (ref 6–20)
CALCIUM: 9.1 mg/dL (ref 8.9–10.3)
CO2: 17 mmol/L — ABNORMAL LOW (ref 22–32)
CREATININE: 0.78 mg/dL (ref 0.44–1.00)
Chloride: 108 mmol/L (ref 101–111)
Glucose, Bld: 263 mg/dL — ABNORMAL HIGH (ref 65–99)
Potassium: 4.3 mmol/L (ref 3.5–5.1)
Sodium: 135 mmol/L (ref 135–145)
TOTAL PROTEIN: 7.1 g/dL (ref 6.5–8.1)

## 2015-12-16 LAB — LIPASE, BLOOD: Lipase: 17 U/L (ref 11–51)

## 2015-12-16 LAB — TROPONIN I: Troponin I: <0.03 ng/mL (ref <0.031)

## 2015-12-16 LAB — GLUCOSE, CAPILLARY: Glucose-Capillary: 346 mg/dL — ABNORMAL HIGH (ref 65–99)

## 2015-12-16 MED ORDER — PANTOPRAZOLE SODIUM 40 MG IV SOLR
40.0000 mg | Freq: Two times a day (BID) | INTRAVENOUS | Status: DC
  Administered 2015-12-16 – 2015-12-19 (×6): 40 mg via INTRAVENOUS
  Filled 2015-12-16 (×6): qty 40

## 2015-12-16 MED ORDER — KETAMINE HCL 10 MG/ML IJ SOLN
0.3000 mg/kg | Freq: Once | INTRAMUSCULAR | Status: AC
  Administered 2015-12-16: 10 mg via INTRAVENOUS
  Filled 2015-12-16: qty 1

## 2015-12-16 MED ORDER — CEPHALEXIN 500 MG PO CAPS: 500.0000 mg | ORAL_CAPSULE | Freq: Once | ORAL | Status: DC

## 2015-12-16 MED ORDER — SODIUM CHLORIDE 0.9 % IV SOLN
INTRAVENOUS | Status: DC
  Administered 2015-12-16 – 2015-12-18 (×3): via INTRAVENOUS

## 2015-12-16 MED ORDER — AMLODIPINE BESYLATE 5 MG PO TABS
5.0000 mg | ORAL_TABLET | Freq: Every day | ORAL | Status: DC
  Administered 2015-12-16 – 2015-12-19 (×5): 5 mg via ORAL
  Filled 2015-12-16 (×5): qty 1

## 2015-12-16 MED ORDER — METOPROLOL TARTRATE 25 MG PO TABS
12.5000 mg | ORAL_TABLET | Freq: Two times a day (BID) | ORAL | Status: DC
  Administered 2015-12-16 – 2015-12-19 (×6): 12.5 mg via ORAL
  Filled 2015-12-16 (×6): qty 1

## 2015-12-16 MED ORDER — IOPAMIDOL (ISOVUE-300) INJECTION 61%
100.0000 mL | Freq: Once | INTRAVENOUS | Status: AC | PRN
  Administered 2015-12-16: 100 mL via INTRAVENOUS

## 2015-12-16 MED ORDER — PROCHLORPERAZINE EDISYLATE 5 MG/ML IJ SOLN
5.0000 mg | Freq: Once | INTRAMUSCULAR | Status: AC
  Administered 2015-12-16: 5 mg via INTRAVENOUS

## 2015-12-16 MED ORDER — INSULIN ASPART 100 UNIT/ML ~~LOC~~ SOLN
0.0000 [IU] | Freq: Every day | SUBCUTANEOUS | Status: DC
  Administered 2015-12-16: 4 [IU] via SUBCUTANEOUS
  Administered 2015-12-17: 2 [IU] via SUBCUTANEOUS
  Filled 2015-12-16: qty 4
  Filled 2015-12-16: qty 2

## 2015-12-16 MED ORDER — METRONIDAZOLE IN NACL 5-0.79 MG/ML-% IV SOLN
500.0000 mg | Freq: Three times a day (TID) | INTRAVENOUS | Status: DC
  Administered 2015-12-16 – 2015-12-19 (×9): 500 mg via INTRAVENOUS
  Filled 2015-12-16 (×12): qty 100

## 2015-12-16 MED ORDER — MORPHINE SULFATE ER 15 MG PO TBCR
15.0000 mg | EXTENDED_RELEASE_TABLET | Freq: Three times a day (TID) | ORAL | Status: DC
  Administered 2015-12-16 – 2015-12-19 (×8): 15 mg via ORAL
  Filled 2015-12-16 (×7): qty 1

## 2015-12-16 MED ORDER — DICYCLOMINE HCL 10 MG PO CAPS
10.0000 mg | ORAL_CAPSULE | Freq: Three times a day (TID) | ORAL | Status: DC
  Administered 2015-12-16 – 2015-12-19 (×11): 10 mg via ORAL
  Filled 2015-12-16 (×15): qty 1

## 2015-12-16 MED ORDER — INSULIN ASPART 100 UNIT/ML ~~LOC~~ SOLN
0.0000 [IU] | Freq: Three times a day (TID) | SUBCUTANEOUS | Status: DC
  Administered 2015-12-17: 3 [IU] via SUBCUTANEOUS
  Administered 2015-12-17 – 2015-12-18 (×2): 2 [IU] via SUBCUTANEOUS
  Administered 2015-12-18 (×2): 3 [IU] via SUBCUTANEOUS
  Administered 2015-12-19: 5 [IU] via SUBCUTANEOUS
  Administered 2015-12-19: 1 [IU] via SUBCUTANEOUS
  Filled 2015-12-16: qty 3
  Filled 2015-12-16: qty 2
  Filled 2015-12-16: qty 3
  Filled 2015-12-16: qty 2
  Filled 2015-12-16: qty 1
  Filled 2015-12-16: qty 5
  Filled 2015-12-16: qty 3

## 2015-12-16 MED ORDER — HYDROMORPHONE HCL 1 MG/ML IJ SOLN
1.0000 mg | Freq: Once | INTRAMUSCULAR | Status: AC
  Administered 2015-12-16: 1 mg via INTRAVENOUS
  Filled 2015-12-16: qty 1

## 2015-12-16 MED ORDER — KETOROLAC TROMETHAMINE 30 MG/ML IJ SOLN
INTRAMUSCULAR | Status: AC
Start: 2015-12-16 — End: 2015-12-16
  Administered 2015-12-16: 30 mg via INTRAVENOUS
  Filled 2015-12-16: qty 1

## 2015-12-16 MED ORDER — LORAZEPAM 2 MG/ML IJ SOLN
1.0000 mg | Freq: Once | INTRAMUSCULAR | Status: AC
  Administered 2015-12-16: 1 mg via INTRAVENOUS
  Filled 2015-12-16: qty 1

## 2015-12-16 MED ORDER — PROCHLORPERAZINE EDISYLATE 5 MG/ML IJ SOLN
10.0000 mg | Freq: Once | INTRAMUSCULAR | Status: AC
  Administered 2015-12-16: 5 mg via INTRAVENOUS
  Filled 2015-12-16: qty 2

## 2015-12-16 MED ORDER — KETOROLAC TROMETHAMINE 30 MG/ML IJ SOLN
30.0000 mg | Freq: Once | INTRAMUSCULAR | Status: AC
  Administered 2015-12-16: 30 mg via INTRAVENOUS

## 2015-12-16 MED ORDER — DIATRIZOATE MEGLUMINE & SODIUM 66-10 % PO SOLN
15.0000 mL | Freq: Once | ORAL | Status: AC
  Administered 2015-12-16: 15 mL via ORAL

## 2015-12-16 MED ORDER — RIFAXIMIN 550 MG PO TABS
550.0000 mg | ORAL_TABLET | Freq: Two times a day (BID) | ORAL | Status: DC
  Administered 2015-12-17 – 2015-12-19 (×5): 550 mg via ORAL
  Filled 2015-12-16 (×8): qty 1

## 2015-12-16 MED ORDER — INSULIN GLARGINE 100 UNIT/ML ~~LOC~~ SOLN
8.0000 [IU] | Freq: Every day | SUBCUTANEOUS | Status: DC
  Administered 2015-12-17 – 2015-12-18 (×3): 8 [IU] via SUBCUTANEOUS
  Filled 2015-12-16 (×4): qty 0.08

## 2015-12-16 MED ORDER — SODIUM CHLORIDE 0.9 % IV SOLN
INTRAVENOUS | Status: AC
  Administered 2015-12-17: 01:00:00 via INTRAVENOUS

## 2015-12-16 MED ORDER — LEVOTHYROXINE SODIUM 75 MCG PO TABS
75.0000 ug | ORAL_TABLET | Freq: Every day | ORAL | Status: DC
  Administered 2015-12-17 – 2015-12-19 (×3): 75 ug via ORAL
  Filled 2015-12-16 (×5): qty 1

## 2015-12-16 MED ORDER — SODIUM CHLORIDE 0.9 % IV BOLUS (SEPSIS)
1000.0000 mL | Freq: Once | INTRAVENOUS | Status: AC
  Administered 2015-12-16: 1000 mL via INTRAVENOUS

## 2015-12-16 MED ORDER — LISINOPRIL 5 MG PO TABS
5.0000 mg | ORAL_TABLET | Freq: Every day | ORAL | Status: DC
  Administered 2015-12-16 – 2015-12-19 (×4): 5 mg via ORAL
  Filled 2015-12-16 (×4): qty 1

## 2015-12-16 MED ORDER — DEXTROSE 5 % IV SOLN
1.0000 g | INTRAVENOUS | Status: DC
  Administered 2015-12-16 – 2015-12-18 (×3): 1 g via INTRAVENOUS
  Filled 2015-12-16 (×4): qty 10

## 2015-12-16 MED ORDER — ASPIRIN EC 81 MG PO TBEC
81.0000 mg | DELAYED_RELEASE_TABLET | Freq: Every day | ORAL | Status: DC
  Administered 2015-12-16 – 2015-12-19 (×4): 81 mg via ORAL
  Filled 2015-12-16 (×4): qty 1

## 2015-12-16 MED ORDER — PROMETHAZINE HCL 25 MG/ML IJ SOLN
12.5000 mg | Freq: Once | INTRAMUSCULAR | Status: AC
  Administered 2015-12-16: 12.5 mg via INTRAVENOUS
  Filled 2015-12-16: qty 1

## 2015-12-16 MED ORDER — SODIUM CHLORIDE 0.9 % IV BOLUS (SEPSIS)
500.0000 mL | Freq: Once | INTRAVENOUS | Status: AC
  Administered 2015-12-16: 500 mL via INTRAVENOUS

## 2015-12-16 MED ORDER — HYDROMORPHONE HCL 1 MG/ML IJ SOLN
1.0000 mg | INTRAMUSCULAR | Status: DC | PRN
  Administered 2015-12-17 – 2015-12-19 (×6): 1 mg via INTRAVENOUS
  Filled 2015-12-16 (×6): qty 1

## 2015-12-16 MED ORDER — METOCLOPRAMIDE HCL 5 MG/ML IJ SOLN
10.0000 mg | Freq: Four times a day (QID) | INTRAMUSCULAR | Status: DC
  Administered 2015-12-16 – 2015-12-19 (×12): 10 mg via INTRAVENOUS
  Filled 2015-12-16 (×11): qty 2

## 2015-12-16 MED ORDER — APIXABAN 5 MG PO TABS
5.0000 mg | ORAL_TABLET | Freq: Two times a day (BID) | ORAL | Status: DC
Start: 2015-12-16 — End: 2015-12-19
  Administered 2015-12-17 – 2015-12-19 (×6): 5 mg via ORAL
  Filled 2015-12-16 (×8): qty 1

## 2015-12-16 NOTE — ED Notes (Signed)
Nursing supervisor called about pts BP progress. Will continue to monitor at this time.

## 2015-12-16 NOTE — ED Notes (Signed)
Patient transported to CT 

## 2015-12-16 NOTE — ED Notes (Signed)
Pt continues to report pain and nausea. Pt reports last "nausea medication helped" this medication was Reglan. MD made aware.

## 2015-12-16 NOTE — H&P (Signed)
[facility] NAME: [name]    MR#:  [id number]  DATE OF BIRTH:  [birth year]  DATE OF ADMISSION:  12/16/2015  PRIMARY CARE PHYSICIAN: Maryland Pink, MD   REQUESTING/REFERRING PHYSICIAN: Dr. Meade Maw  CHIEF COMPLAINT:   Chief Complaint  Patient presents with  . Abdominal Pain  . Nausea  . Emesis    HISTORY OF PRESENT ILLNESS:  [name]  is a 60 y.o. female with a known history of cirrhosis, gastroparesis, chronic back pain presents with severe abdominal pain, nausea vomiting and diarrhea going on for 4 days. The abdominal pain is described as sharp constant in nature worse in the left upper quadrant 10 out of 10 intensity. The diarrhea she stated 12 times today no blood in the bowel movements. Diarrhea is watery. Vomiting 8 or 9 times today with no blood in the vomitus. Patient received numerous pain medications in the ER. CT scan showing colitis. The patient did have a recent hospitalization for a pneumonia last month. Stool studies sent in the ER still pending at this time. Nurse in the ER said stool was actually solid.  PAST MEDICAL HISTORY:   Past Medical History  Diagnosis Date  . Type 1 diabetes ([place])   . Hypothyroid   . Degenerative disk disease   . Stomach ulcer   . Diverticulitis   . Syncope 01/2015  . Anxiety   . GERD (gastroesophageal reflux disease)   . History of hiatal hernia   . Cancer (H[facility])     HX OF CANCER OF UTERUS   . TIA (transient ischemic attack) 02/2015  . Hypertension   . Pancreatitis   . PAF (paroxysmal atrial fibrillation) ([place]) 03/2015    a. new onset 03/2015 in setting of intractable N/V; b. on Eliquis 5 mg bid; c. CHADSVASc 4 (DM, TIA x 2, female)  . Intussusception intestine ([place]) 05/2015  . Stroke ([place])   . Allergy   . Cirrhosis of liver not due to alcohol ([place]) Nov 09, 2014    PAST SURGICAL HISTORY:   Past Surgical History  Procedure Laterality Date  . Hernia repair     . Abdominal hysterectomy    . Cholecystectomy    . Esophagogastroduodenoscopy N/A 04/04/2015    Procedure: ESOPHAGOGASTRODUODENOSCOPY (EGD);  Surgeon: Hulen Luster, MD;  Location: [facility] ENDOSCOPY;  Service: Endoscopy;  Laterality: N/A;    SOCIAL HISTORY:   Social History  Substance Use Topics  . Smoking status: Never Smoker   . Smokeless tobacco: Never Used  . Alcohol Use: No    FAMILY HISTORY:   Family History  Problem Relation Age of Onset  . Hypertension Mother   . CAD Sister   . Heart attack Sister     Deceased 11/09/14  . CAD Brother     DRUG ALLERGIES:   Allergies  Allergen Reactions  . Hydrocodone Other (See Comments)    Pt states that this medication caused cirrhosis of the liver.    . Erythromycin Other (See Comments)    Reaction:  Fever   . Prednisone Other (See Comments)    Reaction:  Unknown   . Rosiglitazone Maleate Swelling  . Codeine Sulfate Rash  . Tetanus-Diphtheria Toxoids Td Rash and Other (See Comments)    Reaction:  Fever     REVIEW OF SYSTEMS:  CONSTITUTIONAL: No fever, fatigue or weakness.  EYES: No blurred or double vision. Wears glasses EARS, NOSE, AND THROAT: No tinnitus or ear pain. No sore throat.  Positive for runny nose RESPIRATORY: No cough, positive for shortness of breath, no wheezing or hemoptysis.  CARDIOVASCULAR: Positive for chest pain, no orthopnea, edema.  GASTROINTESTINAL: Positive for nausea, vomiting, diarrhea and abdominal pain. No blood in bowel movements GENITOURINARY: No dysuria, hematuria.  ENDOCRINE: No polyuria, nocturia,  HEMATOLOGY: No anemia, easy bruising or bleeding SKIN: No rash or lesion. MUSCULOSKELETAL: Positive for joint pain.   NEUROLOGIC: Passed out yesterday PSYCHIATRY: No anxiety or depression.   MEDICATIONS AT HOME:   Prior to Admission medications   Medication Sig Start Date End Date Taking? Authorizing Provider  amLODipine (NORVASC) 5 MG tablet Take 5 mg by mouth daily.   Yes Historical Provider,  MD  apixaban (ELIQUIS) 5 MG TABS tablet Take 1 tablet (5 mg total) by mouth 2 (two) times daily. 04/07/15  Yes Bettey Costa, MD  aspirin EC 81 MG EC tablet Take 1 tablet (81 mg total) by mouth daily. 10/08/15  Yes Loleta Chance, MD  citalopram (CELEXA) 20 MG tablet Take 20 mg by mouth daily.   Yes Historical Provider, MD  dicyclomine (BENTYL) 10 MG capsule Take 1 capsule by mouth 4 (four) times daily - after meals and at bedtime.    Yes Historical Provider, MD  insulin aspart (NOVOLOG) 100 UNIT/ML injection Inject into the skin 3 (three) times daily with meals as needed for high blood sugar. Pt uses as needed per sliding scale.   Yes Historical Provider, MD  insulin detemir (LEVEMIR) 100 UNIT/ML injection Inject 0.17 mLs (17 Units total) into the skin daily. 11/16/15  Yes Belkys A Regalado, MD  levothyroxine (SYNTHROID, LEVOTHROID) 75 MCG tablet Take 1 tablet (75 mcg total) by mouth daily before breakfast. 10/08/15  Yes Loleta Chance, MD  lisinopril (PRINIVIL,ZESTRIL) 5 MG tablet Take 5 mg by mouth daily.   Yes Historical Provider, MD  magnesium oxide (MAG-OX) 400 (241.3 Mg) MG tablet Take 1 tablet (400 mg total) by mouth 2 (two) times daily. 11/16/15  Yes Belkys A Regalado, MD  metoCLOPramide (REGLAN) 5 MG tablet Take 5 mg by mouth 4 (four) times daily -  before meals and at bedtime.   Yes Historical Provider, MD  metoprolol tartrate (LOPRESSOR) 25 MG tablet Take 12.5 mg by mouth 2 (two) times daily.   Yes Historical Provider, MD  morphine (MS CONTIN) 15 MG 12 hr tablet Take 1 tablet (15 mg total) by mouth every 8 (eight) hours. 10/21/15  Yes Lance Bosch, MD  pantoprazole (PROTONIX) 40 MG tablet Take 40 mg by mouth 2 (two) times daily before a meal.   Yes Historical Provider, MD  rifaximin (XIFAXAN) 550 MG TABS tablet Take 1 tablet (550 mg total) by mouth 2 (two) times daily. 07/11/15  Yes Theodoro Grist, MD  tiZANidine (ZANAFLEX) 4 MG tablet Take 4 mg by mouth 3 (three) times daily as needed for muscle spasms.     Yes Historical Provider, MD      VITAL SIGNS:  Blood pressure 199/98, pulse 114, temperature 98.2 F (36.8 C), temperature source Oral, resp. rate 24, height 5' 3"  (1.6 m), weight 58.968 kg (130 lb), SpO2 97 %.  PHYSICAL EXAMINATION:  GENERAL:  60 y.o.-year-old patient lying in the bed with no acute distress.  EYES: Pupils equal, round, reactive to light and accommodation. No scleral icterus. Extraocular muscles intact.  HEENT: Head atraumatic, normocephalic. Oropharynx and nasopharynx clear.  NECK:  Supple, no jugular venous distention. No thyroid enlargement, no tenderness.  LUNGS: Normal breath sounds bilaterally, no wheezing, rales,rhonchi or crepitation. No  use of accessory muscles of respiration.  CARDIOVASCULAR: S1, S2 normal. No murmurs, rubs, or gallops.  ABDOMEN: Soft, generalized abdominal tenderness, nondistended. Bowel sounds present. No organomegaly or mass.  EXTREMITIES: No pedal edema, cyanosis, or clubbing.  NEUROLOGIC: Cranial nerves II through XII are intact. Muscle strength 5/5 in all extremities. Sensation intact. Gait not checked.  PSYCHIATRIC: The patient is alert and oriented x 3.  SKIN: No rash, lesion, or ulcer.   LABORATORY PANEL:   CBC  Recent Labs Lab 12/16/15 1014  WBC 7.9  HGB 11.4*  HCT 34.5*  PLT 135*   ------------------------------------------------------------------------------------------------------------------  Chemistries   Recent Labs Lab 12/16/15 1014  NA 135  K 4.3  CL 108  CO2 17*  GLUCOSE 263*  BUN 15  CREATININE 0.78  CALCIUM 9.1  AST 56*  ALT 32  ALKPHOS 134*  BILITOT 1.7*   ------------------------------------------------------------------------------------------------------------------  Cardiac Enzymes  Recent Labs Lab 12/16/15 1014  TROPONINI <0.03   ------------------------------------------------------------------------------------------------------------------  RADIOLOGY:  Ct Abdomen Pelvis W  Contrast  12/16/2015  CLINICAL DATA:  Abdominal pain, nausea, vomiting and diarrhea. EXAM: CT ABDOMEN AND PELVIS WITH CONTRAST TECHNIQUE: Multidetector CT imaging of the abdomen and pelvis was performed using the standard protocol following bolus administration of intravenous contrast. CONTRAST:  146m ISOVUE-300 IOPAMIDOL (ISOVUE-300) INJECTION 61% COMPARISON:  Abdominal series today as well as prior CT of the abdomen and pelvis on 11/14/2015. FINDINGS: Lower chest: The visualized lung bases show bibasilar atelectasis. No pleural fluid identified. Hepatobiliary: There again is evidence of cirrhosis with grossly nodular appearance of the liver. There is associated ascites surrounding the liver and also elsewhere in the peritoneal cavity. Overall volume of ascites is small to moderate. There is evidence of portal hypertension with distal esophageal and gastroesophageal varices identified supplied by the left gastric vein as well as probable supply from short gastric veins and a gastro-renal shunt emanating from the left renal vein. Pancreas: No mass, inflammatory changes, or other significant abnormality. Spleen: Within normal limits in size and appearance. Adrenals/Urinary Tract: No evidence of hydronephrosis. Nonobstructing calculus in the lower pole of the right kidney present measuring approximately 10 mm in greatest diameter. Stomach/Bowel: There is evidence of new colitis with significant circumferential thickening involving the ascending and transverse colon. Colonic thickening appears extends just into the proximal descending colon. No evidence of bowel perforation or focal abscess. No small bowel dilatation or obstruction. The stomach continues to show evidence of prominent folds at the level of the gastric cardia and GE junction extending into the body of the stomach. Prominence at the level of the gastric cardia is more pronounced compared to prior studies and it may help to correlate this finding with  repeat EGD. Vascular/Lymphatic: No pathologically enlarged lymph nodes. No evidence of abdominal aortic aneurysm. Reproductive: The uterus has been removed. Other: No hernias identified. Musculoskeletal: Bony structures show stable spondylosis at L5-S1 a mild leftward convex scoliosis of the lumbar spine. IMPRESSION: 1. New colitis involving the ascending and transverse colon. No evidence of bowel perforation, focal abscess or small bowel obstruction. 2. Cirrhosis and portal hypertension with distal esophageal and gastroesophageal varices identified supplied by probable left gastric and gastro-renal shunts. 3. Some increased prominence of folds at the level of the gastric cardia extending into the body of the stomach. Some of these changes were apparently secondary to prior fundoplication and gastric inflammation by prior EGD. Correlation with repeat EGD may be helpful, especially given the CT evidence of definite distal esophageal varices. Electronically Signed  By: Aletta Edouard M.D.   On: 12/16/2015 15:48   Dg Abd Acute W/chest  12/16/2015  CLINICAL DATA:  60 year old female with abdominal pain nausea vomiting and diarrhea for 4 days. Initial encounter. EXAM: DG ABDOMEN ACUTE W/ 1V CHEST COMPARISON:  CT Abdomen and Pelvis 11/14/2015 and earlier. FINDINGS: Upright AP view of the chest. Improved lung volumes and resolved left lung base effusion and opacity. No pneumothorax or pneumoperitoneum. Normal cardiac size and mediastinal contours. No new pulmonary opacity. Moderate gaseous distension of the stomach. Mild paucity of bowel gas elsewhere, and somewhat featureless appearance of gas-filled loops. There is gas throughout nondilated descending and proximal sigmoid colon. S-shaped scoliosis. No acute osseous abnormality identified. Stable cholecystectomy clips. IMPRESSION: 1. No abdominal free air. Moderately distended stomach with nonobstructed bowel-gas pattern otherwise. The somewhat featureless appearance  of bowel loops however might reflect ileus. 2. Resolved left pleural effusion and airspace disease since March. No acute cardiopulmonary abnormality. Electronically Signed   By: Genevie Ann M.D.   On: 12/16/2015 10:58    EKG:   Sinus tachycardia 137 bpm, low voltage, flattening of T waves laterally  IMPRESSION AND PLAN:   1. Severe abdominal pain, nausea vomiting and diarrhea. Colitis seen on CT scan. With recent antibiotic use and hospitalization this could be C. difficile colitis but the nurse stated that the stool was solid. I will start empiric antibiotics Flagyl and Rocephin and wait for stool studies. Start IV Reglan since the patient does have a history of gastroparesis. This could also be withdrawal from pain medications. Give IV pain medication for now and oral if she is able to tolerate. Start clear liquid diet. 2. History of paroxysmal atrial fibrillation. On eliquis for anticoagulation. Metoprolol for rate control. 3. Accelerated hypertension. This is secondary to pain. Once we control pain better the blood pressure will come down. 4. Type 2 diabetes with hyperglycemia. Put on sliding scale and low dose Lantus. 5. History of cirrhosis secondary to medication, thrombocytopenia, varices seen on CT scan 6. Hypothyroidism unspecified continue levothyroxine 7. Chronic back pain. Continue oral medication  All the records are reviewed and case discussed with ED provider. Management plans discussed with the patient, family and they are in agreement.  CODE STATUS: DO NOT RESUSCITATE  TOTAL TIME TAKING CARE OF THIS PATIENT: 50 minutes.    Loletha Grayer M.D on 12/16/2015 at 5:04 PM  Between 7am to 6pm - Pager - [phone]  After 6pm call admission pager [phone]  [facility]  [phone]  CC: Primary care physician; [place] Pink, MD

## 2015-12-16 NOTE — Progress Notes (Signed)
Dr Marcelene Butte requested to use promethazine in this patient as other options were not working. (Promethazine is currently on national backorder with MD unable to enter order)  When Pharmacist entered order for promethazine x1 an alert for QT interval longation or [place] occurred.  Discussed alert with MD on the phone. MD wants to continue with promethazine order at this time.  Chinita Greenland PharmD Clinical Pharmacist 12/16/2015 11:42 AM

## 2015-12-16 NOTE — ED Notes (Signed)
RN attempted prompting patient to use restroom. Pt reports she is going to need to wait a little longer. Pt verbalized she will inform RN when she is able to use restroom.

## 2015-12-16 NOTE — ED Notes (Signed)
Floor has rejected the patient due to elevated BP. Hospitalist refused top push IV BP medication. Pt has received PO medication but floor reports this is not enough to accept patient. ED Charge nurse has been made aware.

## 2015-12-16 NOTE — ED Notes (Signed)
Patient states she was hospitalized at the beginning of March due to gastroparesis

## 2015-12-16 NOTE — ED Provider Notes (Signed)
Time Seen: Approximately 10:30 I have reviewed the triage notes  Chief Complaint: Abdominal Pain; Nausea; and Emesis   History of Present Illness: [name] is a 59 y.o. female who presents with some persistent nausea and vomiting and diarrhea now for the last 3-4 days. She states she's had difficulty maintaining any consistent food or fluid intake. She describes abdominal pain and points primarily to the epigastric area. Patient denies any fever, hematemesis or biliary emesis. Patient denies any back pain on the sides but does state some middle back discomfort. Patient denies any lower abdominal pain, melena or hematochezia. She describes multiple loose watery stools. She denies any travel but was on antibiotic therapy at the beginning of March for treatment of pneumonia.  Past Medical History  Diagnosis Date  . Type 1 diabetes ([place])   . Hypothyroid   . Degenerative disk disease   . Stomach ulcer   . Diverticulitis   . Syncope 01/2015  . Anxiety   . GERD (gastroesophageal reflux disease)   . History of hiatal hernia   . Cancer (HCC)     HX OF CANCER OF UTERUS   . TIA (transient ischemic attack) 02/2015  . Hypertension   . Pancreatitis   . PAF (paroxysmal atrial fibrillation) ([place]) 03/2015    a. new onset 03/2015 in setting of intractable N/V; b. on Eliquis 5 mg bid; c. CHADSVASc 4 (DM, TIA x 2, female)  . Intussusception intestine ([place]) 05/2015  . Stroke ([place])   . Allergy   . Cirrhosis of liver not due to alcohol [facility]) 2016    Patient Active Problem List   Diagnosis Date Noted  . Pneumonia 11/14/2015  . Narcotic withdrawal ([place]) 11/11/2015  . Diabetes mellitus type 2, insulin dependent ([place]) 11/11/2015  . Orthostatic hypotension   . H/O TIA (transient ischemic attack) and stroke   . Left-sided weakness 10/04/2015  . Expressive aphasia 10/04/2015  . Ileus ([place]) 08/01/2015  . Ascites   . Cryptogenic cirrhosis ([place]) 07/10/2015  . Paroxysmal atrial fibrillation ([place])  07/10/2015  . C. difficile colitis 07/10/2015  . GI (gastrointestinal bleed) 07/06/2015  . Malnutrition of moderate degree 07/04/2015  . Symptomatic bradycardia 05/27/2015  . Intussusception intestine ([place])   . Hypothyroidism due to amiodarone   . Abdominal pain, chronic, epigastric   . Chronic anemia 05/19/2015  . Thrombocytopenia ([place]) 05/19/2015  . Prolonged QT interval   . Hypomagnesemia   . Narcotic abuse   . Type 2 diabetes mellitus without complication ([place])   . Syncope due to orthostatic hypotension 05/18/2015  . Hypokalemia 04/06/2015  . Hyperlipidemia with target LDL less than 100 04/06/2015  . CVA (cerebral infarction) 02/15/2015  . Essential hypertension 01/12/2015  . Chronically on opiate therapy 01/12/2015  . Gastroparesis 01/12/2015  . DEPRESSION/ANXIETY 06/27/2007  . MYOFASCIAL PAIN SYNDROME 06/27/2007  . Chronic pain syndrome 03/28/2007  . GERD 03/27/2007  . DIVERTICULOSIS, COLON 03/27/2007  . LUMBAR DISC DISPLACEMENT 03/27/2007  . PROTEINURIA 03/27/2007  . UTERINE CANCER, HX OF 03/27/2007    Past Surgical History  Procedure Laterality Date  . Hernia repair    . Abdominal hysterectomy    . Cholecystectomy    . Esophagogastroduodenoscopy N/A 04/04/2015    Procedure: ESOPHAGOGASTRODUODENOSCOPY (EGD);  Surgeon: Hulen Luster, MD;  Location: [facility] ENDOSCOPY;  Service: Endoscopy;  Laterality: N/A;    Past Surgical History  Procedure Laterality Date  . Hernia repair    . Abdominal hysterectomy    . Cholecystectomy    .  Esophagogastroduodenoscopy N/A 04/04/2015    Procedure: ESOPHAGOGASTRODUODENOSCOPY (EGD);  Surgeon: Hulen Luster, MD;  Location: [facility] ENDOSCOPY;  Service: Endoscopy;  Laterality: N/A;    Current Outpatient Rx  Name  Route  Sig  Dispense  Refill  . apixaban (ELIQUIS) 5 MG TABS tablet   Oral   Take 1 tablet (5 mg total) by mouth 2 (two) times daily.   60 tablet   0   . aspirin EC 81 MG EC tablet   Oral   Take 1 tablet (81 mg total) by mouth  daily.   30 tablet   3   . dicyclomine (BENTYL) 10 MG capsule   Oral   Take 1 capsule by mouth 4 (four) times daily - after meals and at bedtime.          . feeding supplement, GLUCERNA SHAKE, (GLUCERNA SHAKE) LIQD   Oral   Take 237 mLs by mouth 3 (three) times daily after meals. Patient taking differently: Take 237 mLs by mouth 3 (three) times daily as needed (meal replacement).    90 Can   3   . insulin detemir (LEVEMIR) 100 UNIT/ML injection   Subcutaneous   Inject 0.17 mLs (17 Units total) into the skin daily.   10 mL   11   . lactulose (CHRONULAC) 10 GM/15ML solution   Oral   Take 15-45 mLs (10-30 g total) by mouth 2 (two) times daily as needed (P). Patient taking differently: Take 10-30 g by mouth 2 (two) times daily as needed for mild constipation.    240 mL   0   . levofloxacin (LEVAQUIN) 500 MG tablet   Oral   Take 1 tablet (500 mg total) by mouth daily.   5 tablet   0   . levothyroxine (SYNTHROID, LEVOTHROID) 75 MCG tablet   Oral   Take 1 tablet (75 mcg total) by mouth daily before breakfast.   30 tablet   1   . magnesium oxide (MAG-OX) 400 (241.3 Mg) MG tablet   Oral   Take 1 tablet (400 mg total) by mouth 2 (two) times daily.   10 tablet   0   . metoprolol (LOPRESSOR) 50 MG tablet   Oral   Take 1 tablet (50 mg total) by mouth 2 (two) times daily.   30 tablet   0   . morphine (MS CONTIN) 15 MG 12 hr tablet   Oral   Take 1 tablet (15 mg total) by mouth every 8 (eight) hours. Patient taking differently: Take 30 mg by mouth every 8 (eight) hours.    75 tablet   0   . pantoprazole (PROTONIX) 40 MG tablet   Oral   Take 1 tablet (40 mg total) by mouth 2 (two) times daily.   60 tablet   0   . rifaximin (XIFAXAN) 550 MG TABS tablet   Oral   Take 1 tablet (550 mg total) by mouth 2 (two) times daily.   60 tablet   6   . tiZANidine (ZANAFLEX) 4 MG tablet   Oral   Take 4 mg by mouth 3 (three) times daily as needed for muscle spasms.           Marland Kitchen zolpidem (AMBIEN) 10 MG tablet   Oral   Take 10 mg by mouth at bedtime. Reported on 10/04/2015           Allergies:  Hydrocodone; Erythromycin base; Prednisone; Rosiglitazone maleate; Codeine sulfate; and Tetanus-diphtheria toxoids td  Family History: Family  History  Problem Relation Age of Onset  . Hypertension Mother   . CAD Sister   . Heart attack Sister     Deceased 12/01/2014  . CAD Brother     Social History: Social History  Substance Use Topics  . Smoking status: Never Smoker   . Smokeless tobacco: Never Used  . Alcohol Use: No     Review of Systems:   10 point review of systems was performed and was otherwise negative:  Constitutional: No fever Eyes: No visual disturbances ENT: No sore throat, ear pain Cardiac: No chest pain Respiratory: No shortness of breath, wheezing, or stridor Abdomen: Epigastric abdominal pain with radiation to the middle of her back Endocrine: No weight loss, No night sweats Extremities: No peripheral edema, cyanosis Skin: No rashes, easy bruising Neurologic: No focal weakness, trouble with speech or swollowing Urologic: No dysuria, Hematuria, or urinary frequency *No diaphoresis Physical Exam:  ED Triage Vitals  Enc Vitals Group     BP 12/16/15 0957 174/99 mmHg     Pulse Rate 12/16/15 0957 106     Resp 12/16/15 0957 22     Temp 12/16/15 0957 98.2 F (36.8 C)     Temp Source 12/16/15 0957 Oral     SpO2 12/16/15 0955 98 %     Weight 12/16/15 0957 130 lb (58.968 kg)     Height 12/16/15 0957 5' 3"  (1.6 m)     Head Cir --      Peak Flow --      Pain Score 12/16/15 0957 10     Pain Loc --      Pain Edu? --      Excl. in [place]? --     General: Awake , Alert , and Oriented times 3; GCS 15 Head: Normal cephalic , atraumatic Eyes: Pupils equal , round, reactive to light Nose/Throat: No nasal drainage, patent upper airway without erythema or exudate.  Neck: Supple, Full range of motion, No anterior adenopathy or palpable thyroid  masses Lungs: Clear to ascultation without wheezes , rhonchi, or rales Heart: Regular rate, regular rhythm without murmurs , gallops , or rubs Abdomen: Soft, non tender without rebound, guarding , or rigidity; bowel sounds positive and symmetric in all 4 quadrants. No organomegaly .        Extremities: 2 plus symmetric pulses. No edema, clubbing or cyanosis Neurologic: normal ambulation, Motor symmetric without deficits, sensory intact Skin: warm, dry, no rashes   Labs:   All laboratory work was reviewed including any pertinent negatives or positives listed below:  Labs Reviewed  COMPREHENSIVE METABOLIC PANEL - Abnormal; Notable for the following:    CO2 17 (*)    Glucose, Bld 263 (*)    AST 56 (*)    Alkaline Phosphatase 134 (*)    Total Bilirubin 1.7 (*)    All other components within normal limits  CBC - Abnormal; Notable for the following:    Hemoglobin 11.4 (*)    HCT 34.5 (*)    RDW 16.7 (*)    Platelets 135 (*)    All other components within normal limits  GASTROINTESTINAL PANEL BY PCR, STOOL (REPLACES STOOL CULTURE)  C DIFFICILE QUICK SCREEN W PCR REFLEX  LIPASE, BLOOD  TROPONIN I  URINALYSIS COMPLETEWITH MICROSCOPIC ([facility] ONLY)  Urinalysis and stool are still pending at this time.   Radiology: * CLINICAL DATA: 60 year old female with abdominal pain nausea vomiting and diarrhea for 4 days. Initial encounter.  EXAM: DG ABDOMEN ACUTE W/ 1V  CHEST  COMPARISON: CT Abdomen and Pelvis 11/14/2015 and earlier.  FINDINGS: Upright AP view of the chest. Improved lung volumes and resolved left lung base effusion and opacity. No pneumothorax or pneumoperitoneum. Normal cardiac size and mediastinal contours. No new pulmonary opacity.  Moderate gaseous distension of the stomach. Mild paucity of bowel gas elsewhere, and somewhat featureless appearance of gas-filled loops. There is gas throughout nondilated descending and proximal sigmoid colon. S-shaped scoliosis. No  acute osseous abnormality identified. Stable cholecystectomy clips.  IMPRESSION: 1. No abdominal free air. Moderately distended stomach with nonobstructed bowel-gas pattern otherwise. The somewhat featureless appearance of bowel loops however might reflect ileus. 2. Resolved left pleural effusion and airspace disease since March. No acute cardiopulmonary abnormality. Abdominal CT is pending   I personally reviewed the radiologic studies    ED Course: * Patient has a history of narcotic abuse and I tried to avoid narcotics in her treatment plan. She also has history of prolonged QT interval and we tried to avoid Zofran. Patient was given Compazine with minimal improvement in her nausea and vomiting and had an IV Phenergan added. The patient also received IV ketamine for analgesia which was pushed by myself which gave her some temporary pain control but now she states the pain has returned. Patient then received IV Dilaudid for discomfort and states symptomatic improvement. She does not appear to have any focal peritoneal signs on exam and has a previous history of cholecystectomy. Elevation to better evaluate her abdominal pain with a abdominal CAT scan with results currently pending. She has not provided [name] with a stool sample yet to study for C. difficile, etc.  Assessment: * Acute nausea, vomiting and diarrhea with abdominal pain     Plan:  CT scan evaluation           Daymon Larsen, MD 12/16/15 1451

## 2015-12-16 NOTE — ED Notes (Signed)
MD Quigley at bedside. 

## 2015-12-16 NOTE — ED Notes (Signed)
Patient arrived via EMS from home with c/o abdominal pain, N, V, and diarrhea for the past 3-4 days and is unable to keep any medications or anything down.  Hx of diabetes.  Dry heaving several times in room during initial assessment

## 2015-12-17 ENCOUNTER — Inpatient Hospital Stay: Payer: Commercial Managed Care - HMO

## 2015-12-17 ENCOUNTER — Encounter: Payer: Self-pay | Admitting: Emergency Medicine

## 2015-12-17 LAB — BASIC METABOLIC PANEL
Anion gap: 4 — ABNORMAL LOW (ref 5–15)
BUN: 21 mg/dL — ABNORMAL HIGH (ref 6–20)
CALCIUM: 8.1 mg/dL — AB (ref 8.9–10.3)
CO2: 21 mmol/L — ABNORMAL LOW (ref 22–32)
CREATININE: 0.82 mg/dL (ref 0.44–1.00)
Chloride: 109 mmol/L (ref 101–111)
GFR calc Af Amer: >60 mL/min (ref >60)
GLUCOSE: 277 mg/dL — AB (ref 65–99)
Potassium: 4 mmol/L (ref 3.5–5.1)
Sodium: 134 mmol/L — ABNORMAL LOW (ref 135–145)

## 2015-12-17 LAB — GLUCOSE, CAPILLARY
GLUCOSE-CAPILLARY: 109 mg/dL — AB (ref 65–99)
GLUCOSE-CAPILLARY: 212 mg/dL — AB (ref 65–99)
Glucose-Capillary: 363 mg/dL — ABNORMAL HIGH (ref 65–99)
Glucose-Capillary: 189 mg/dL — ABNORMAL HIGH (ref 65–99)
Glucose-Capillary: 218 mg/dL — ABNORMAL HIGH (ref 65–99)

## 2015-12-17 LAB — CBC
HCT: 32.6 % — ABNORMAL LOW (ref 35.0–47.0)
Hemoglobin: 10.7 g/dL — ABNORMAL LOW (ref 12.0–16.0)
MCH: 28.5 pg (ref 26.0–34.0)
MCHC: 32.7 g/dL (ref 32.0–36.0)
MCV: 87.1 fL (ref 80.0–100.0)
Platelets: 150 10*3/uL (ref 150–440)
RBC: 3.74 MIL/uL — ABNORMAL LOW (ref 3.80–5.20)
RDW: 16.6 % — AB (ref 11.5–14.5)
WBC: 13.1 10*3/uL — ABNORMAL HIGH (ref 3.6–11.0)

## 2015-12-17 MED ORDER — BENZONATATE 100 MG PO CAPS
200.0000 mg | ORAL_CAPSULE | Freq: Three times a day (TID) | ORAL | Status: DC
  Administered 2015-12-17 – 2015-12-19 (×6): 200 mg via ORAL
  Filled 2015-12-17 (×6): qty 2

## 2015-12-17 MED ORDER — GUAIFENESIN-DM 100-10 MG/5ML PO SYRP: 10.0000 mL | ORAL_SOLUTION | ORAL | Status: DC | PRN

## 2015-12-17 MED ORDER — ONDANSETRON HCL 4 MG/2ML IJ SOLN
4.0000 mg | Freq: Four times a day (QID) | INTRAMUSCULAR | Status: DC | PRN
  Administered 2015-12-19: 4 mg via INTRAVENOUS
  Filled 2015-12-17: qty 2

## 2015-12-17 MED ORDER — BENZONATATE 100 MG PO CAPS
100.0000 mg | ORAL_CAPSULE | Freq: Three times a day (TID) | ORAL | Status: DC | PRN
  Administered 2015-12-17 (×2): 100 mg via ORAL
  Filled 2015-12-17 (×2): qty 1

## 2015-12-17 MED ORDER — IPRATROPIUM-ALBUTEROL 0.5-2.5 (3) MG/3ML IN SOLN
3.0000 mL | RESPIRATORY_TRACT | Status: DC
  Administered 2015-12-17 – 2015-12-19 (×11): 3 mL via RESPIRATORY_TRACT
  Filled 2015-12-17 (×10): qty 3

## 2015-12-17 NOTE — Progress Notes (Signed)
Initial Nutrition Assessment  DOCUMENTATION CODES:   Non-severe (moderate) malnutrition in context of chronic illness  INTERVENTION:   Medical Food Supplement Therapy: recommend addition of Ensure Enlive po BID, each supplement provides 350 kcal and 20 grams of protein, once diet advanced Education: pt hx of gastroparesis, diverticulitis/colitis and diabetes and reports not much prior education; provided pt with several handouts including "gastroparesis nutrition therapy," "low fiber nutrition therapy" and "carbohydrate counting for people with diabetes." Pt had many questions regarding her DM including questions regarding her home insulin regimen (pt reports to not understand how much insulin she should be taking or when she should be taking it). Noted last HgbA1c 8.2 in March of this year. Discussed with RN who suggested a consult to the diabetes coordinator, orders entered. All questions pertaining to diet answered by RD, pt very receptive, adherence likely.   NUTRITION DIAGNOSIS:   Malnutrition related to chronic illness as evidenced by mild depletion of body fat, mild depletion of muscle mass, mild fluid accumulation.  GOAL:   Patient will meet greater than or equal to 90% of their needs   MONITOR:   Diet advancement, Supplement acceptance, Labs, Weight trends  REASON FOR ASSESSMENT:   Malnutrition Screening Tool    ASSESSMENT:    60 y.o. female with a known history of cirrhosis, gastroparesis, pancreatitis presents with severe abdominal pain, nausea, vomiting and diarrhea going on for 4 days. Watery diarrhea and vomiting multiple times per day. CT scan showing colitis.   Past Medical History  Diagnosis Date  . Type 1 diabetes ([place])   . Hypothyroid   . Degenerative disk disease   . Stomach ulcer   . Diverticulitis   . Syncope 01/2015  . Anxiety   . GERD (gastroesophageal reflux disease)   . History of hiatal hernia   . Cancer (HCC)     HX OF CANCER OF UTERUS   . TIA  (transient ischemic attack) 02/2015  . Hypertension   . Pancreatitis   . PAF (paroxysmal atrial fibrillation) ([place]) 03/2015    a. new onset 03/2015 in setting of intractable N/V; b. on Eliquis 5 mg bid; c. CHADSVASc 4 (DM, TIA x 2, female)  . Intussusception intestine ([place]) 05/2015  . Stroke ([place])   . Allergy   . Cirrhosis of liver not due to alcohol ([place]) 2016     Diet Order:  Diet clear liquid Room service appropriate?: Yes; Fluid consistency:: Thin   Energy Intake: recorded po intake 100% of CL tray this AM; pt reports she drank her coffee, took bites of jello and 8-9 spoons of broth; pt tolerated this with no vomiting  Food and Nutrition Related history: pt reports she has only been able to tolerate sips/bites over the past week due to N/V/D. Pt reports prior to this eating better, although not well. Pt reports appetite has been down since the death of her sister last year; pt eats because she knows she needs to, not because of her appetite. Pt reports her sister had encouraged her to drink Ensure but she has not been doing so consistently. Pt has met clinical characteristics for moderate malnutrition on context of chronic illness on previous admission as far back as 1 year ago and poor appetite/po intake has been a chronic problem  Skin:  Reviewed, no issues  Last BM:  12/17/15 diarrhea   Digestive System: +nausea this AM resolved with meds, no vomiting, watery diarrhea continues  Labs: sodium 134, lipase wdl Glucose Profile:   Recent Labs  12/17/15 0121 12/17/15 0742 12/17/15 1132  GLUCAP 363* 189* 109*   Lab Results  Component Value Date   HGBA1C 8.2* 11/11/2015    Meds: ss novolog, novolog with meals, lantus, reglan, xifaxan, NS at 100 ml/hr  Nutrition Focused Physical Exam: Nutrition-Focused physical exam completed. Findings are mild fat depletion, mild/moderate muscle depletion, and mild edema.    Height:   Ht Readings from Last 1 Encounters:  12/16/15 5' 3"  (1.6  m)    Weight: pt reports 10 pound wt loss in 1 week; 6.8% wt loss. Per wt encounters, wt has fluctuated some up and down but relatively stable (no true trend of wt loss or wt gain). Pt does report swelling in her legs at times  Wt Readings from Last 1 Encounters:  12/17/15 136 lb 14.4 oz (62.097 kg)    Wt Readings from Last 10 Encounters:  12/17/15 136 lb 14.4 oz (62.097 kg)  11/16/15 136 lb 4.8 oz (61.825 kg)  10/21/15 133 lb (60.328 kg)  10/15/15 131 lb (59.421 kg)  10/08/15 136 lb 9.6 oz (61.961 kg)  08/16/15 142 lb 8 oz (64.638 kg)  08/06/15 151 lb (68.493 kg)  07/11/15 145 lb 4.8 oz (65.908 kg)  06/14/15 133 lb 12.8 oz (60.691 kg)  06/07/15 137 lb (62.143 kg)    BMI:  Body mass index is 24.26 kg/(m^2).  Estimated Nutritional Needs:   Kcal:  1550-1860 kcals   Protein:  68-81 g   Fluid:  >1.5 L per day  EDUCATION NEEDS:   Education needs addressed  Kerman Passey [place], [place], LDN [phone] Pager  [phone] Weekend/On-Call Pager

## 2015-12-17 NOTE — Progress Notes (Signed)
[facility] [place] NAME: [name]    MR#:  [id number]  DATE OF BIRTH:  1956-04-11  SUBJECTIVE:  CHIEF COMPLAINT:   Chief Complaint  Patient presents with  . Abdominal Pain  . Nausea  . Emesis   - admitted with colitis, nausea, vomiting and diarrhea and abdominal pain.  -Symptoms still persistent. - On clear liquid diet. Also complains of cough, wheezing and difficulty breathing.  REVIEW OF SYSTEMS:  Review of Systems  Constitutional: Negative for fever, chills and malaise/fatigue.  HENT: Negative for ear discharge, ear pain and nosebleeds.   Eyes: Negative for blurred vision and double vision.  Respiratory: Positive for cough, shortness of breath and wheezing.   Cardiovascular: Negative for chest pain and palpitations.  Gastrointestinal: Positive for nausea, vomiting, abdominal pain and diarrhea. Negative for constipation.  Genitourinary: Negative for dysuria and urgency.  Musculoskeletal: Positive for back pain. Negative for myalgias.  Neurological: Negative for dizziness, sensory change, speech change, focal weakness, seizures and headaches.  Psychiatric/Behavioral: Negative for depression.    DRUG ALLERGIES:   Allergies  Allergen Reactions  . Hydrocodone Other (See Comments)    Pt states that this medication caused cirrhosis of the liver.    . Erythromycin Other (See Comments)    Reaction:  Fever   . Prednisone Other (See Comments)    Reaction:  Unknown   . Rosiglitazone Maleate Swelling  . Codeine Sulfate Rash  . Tetanus-Diphtheria Toxoids Td Rash and Other (See Comments)    Reaction:  Fever     VITALS:  Blood pressure 123/79, pulse 130, temperature 98.9 F (37.2 C), temperature source Oral, resp. rate 23, height 5' 3"  (1.6 m), weight 62.097 kg (136 lb 14.4 oz), SpO2 91 %.  PHYSICAL EXAMINATION:  Physical Exam  GENERAL:  60 y.o.-year-old patient lying in the bed with no acute distress.  EYES: Pupils  equal, round, reactive to light and accommodation. No scleral icterus. Extraocular muscles intact.  HEENT: Head atraumatic, normocephalic. Oropharynx and nasopharynx clear.  NECK:  Supple, no jugular venous distention. No thyroid enlargement, no tenderness.  LUNGS: Diffuse wheezing present bilaterally, no rales,rhonchi or crepitation. No use of accessory muscles of respiration.  CARDIOVASCULAR: S1, S2 normal. No murmurs, rubs, or gallops.  ABDOMEN: Soft, some tenderness in the epigastric region,, nondistended. Bowel sounds present. No organomegaly or mass.  EXTREMITIES: No pedal edema, cyanosis, or clubbing.  NEUROLOGIC: Cranial nerves II through XII are intact. Muscle strength 5/5 in all extremities. Sensation intact. Gait not checked.  PSYCHIATRIC: The patient is alert and oriented x 3.  SKIN: No obvious rash, lesion, or ulcer.    LABORATORY PANEL:   CBC  Recent Labs Lab 12/17/15 0452  WBC 13.1*  HGB 10.7*  HCT 32.6*  PLT 150   ------------------------------------------------------------------------------------------------------------------  Chemistries   Recent Labs Lab 12/16/15 1014 12/17/15 0452  NA 135 134*  K 4.3 4.0  CL 108 109  CO2 17* 21*  GLUCOSE 263* 277*  BUN 15 21*  CREATININE 0.78 0.82  CALCIUM 9.1 8.1*  AST 56*  --   ALT 32  --   ALKPHOS 134*  --   BILITOT 1.7*  --    ------------------------------------------------------------------------------------------------------------------  Cardiac Enzymes  Recent Labs Lab 12/16/15 1014  TROPONINI <0.03   ------------------------------------------------------------------------------------------------------------------  RADIOLOGY:  Dg Chest 2 View  12/17/2015  CLINICAL DATA:  Recent onset of cough; admitted yesterday; recently hospitalized for pneumonia; hx/o cirrhosis, htn, paf, and cancer of uterus EXAM: CHEST  2 VIEW COMPARISON:  12/16/2015 FINDINGS: Since the previous day's study, areas of airspace  opacity have developed, most evident in the right upper lobe, but also noted in the right lower lobe and left lower lobe. No pleural effusion or pneumothorax. Cardiac silhouette is normal in size and configuration. No mediastinal or hilar masses or convincing adenopathy. Bony thorax is intact. IMPRESSION: 1. Multifocal pneumonia has become apparent since the previous day's study. Electronically Signed   By: Lajean Manes M.D.   On: 12/17/2015 12:23   Ct Abdomen Pelvis W Contrast  12/16/2015  CLINICAL DATA:  Abdominal pain, nausea, vomiting and diarrhea. EXAM: CT ABDOMEN AND PELVIS WITH CONTRAST TECHNIQUE: Multidetector CT imaging of the abdomen and pelvis was performed using the standard protocol following bolus administration of intravenous contrast. CONTRAST:  148m ISOVUE-300 IOPAMIDOL (ISOVUE-300) INJECTION 61% COMPARISON:  Abdominal series today as well as prior CT of the abdomen and pelvis on 11/14/2015. FINDINGS: Lower chest: The visualized lung bases show bibasilar atelectasis. No pleural fluid identified. Hepatobiliary: There again is evidence of cirrhosis with grossly nodular appearance of the liver. There is associated ascites surrounding the liver and also elsewhere in the peritoneal cavity. Overall volume of ascites is small to moderate. There is evidence of portal hypertension with distal esophageal and gastroesophageal varices identified supplied by the left gastric vein as well as probable supply from short gastric veins and a gastro-renal shunt emanating from the left renal vein. Pancreas: No mass, inflammatory changes, or other significant abnormality. Spleen: Within normal limits in size and appearance. Adrenals/Urinary Tract: No evidence of hydronephrosis. Nonobstructing calculus in the lower pole of the right kidney present measuring approximately 10 mm in greatest diameter. Stomach/Bowel: There is evidence of new colitis with significant circumferential thickening involving the ascending and  transverse colon. Colonic thickening appears extends just into the proximal descending colon. No evidence of bowel perforation or focal abscess. No small bowel dilatation or obstruction. The stomach continues to show evidence of prominent folds at the level of the gastric cardia and GE junction extending into the body of the stomach. Prominence at the level of the gastric cardia is more pronounced compared to prior studies and it may help to correlate this finding with repeat EGD. Vascular/Lymphatic: No pathologically enlarged lymph nodes. No evidence of abdominal aortic aneurysm. Reproductive: The uterus has been removed. Other: No hernias identified. Musculoskeletal: Bony structures show stable spondylosis at L5-S1 a mild leftward convex scoliosis of the lumbar spine. IMPRESSION: 1. New colitis involving the ascending and transverse colon. No evidence of bowel perforation, focal abscess or small bowel obstruction. 2. Cirrhosis and portal hypertension with distal esophageal and gastroesophageal varices identified supplied by probable left gastric and gastro-renal shunts. 3. Some increased prominence of folds at the level of the gastric cardia extending into the body of the stomach. Some of these changes were apparently secondary to prior fundoplication and gastric inflammation by prior EGD. Correlation with repeat EGD may be helpful, especially given the CT evidence of definite distal esophageal varices. Electronically Signed   By: GAletta EdouardM.D.   On: 12/16/2015 15:48   Dg Abd Acute W/chest  12/16/2015  CLINICAL DATA:  61year old female with abdominal pain nausea vomiting and diarrhea for 4 days. Initial encounter. EXAM: DG ABDOMEN ACUTE W/ 1V CHEST COMPARISON:  CT Abdomen and Pelvis 11/14/2015 and earlier. FINDINGS: Upright AP view of the chest. Improved lung volumes and resolved left lung base effusion and opacity. No pneumothorax or pneumoperitoneum. Normal cardiac size and mediastinal contours.  No new  pulmonary opacity. Moderate gaseous distension of the stomach. Mild paucity of bowel gas elsewhere, and somewhat featureless appearance of gas-filled loops. There is gas throughout nondilated descending and proximal sigmoid colon. S-shaped scoliosis. No acute osseous abnormality identified. Stable cholecystectomy clips. IMPRESSION: 1. No abdominal free air. Moderately distended stomach with nonobstructed bowel-gas pattern otherwise. The somewhat featureless appearance of bowel loops however might reflect ileus. 2. Resolved left pleural effusion and airspace disease since March. No acute cardiopulmonary abnormality. Electronically Signed   By: Genevie Ann M.D.   On: 12/16/2015 10:58    EKG:   Orders placed or performed during the hospital encounter of 12/16/15  . ED EKG  . ED EKG  . EKG 12-Lead  . EKG 12-Lead  . EKG 12-Lead  . EKG 12-Lead    ASSESSMENT AND PLAN:   60 year old female with past medical history significant for liver cirrhosis, gastroparesis, chronic back pain, GERD, anxiety and diabetes mellitus, history of paroxysmal atrial fibrillation presents to the hospital secondary to nausea vomiting and abdominal pain.  #1 acute colitis-continue on clear liquid diet. -On Rocephin and Flagyl at this time. -Diarrhea has improved. Still with nausea vomiting. -Continue IV fluid support and conservative treatment.  #2 acute wheezing-no history of any smoking or passive smoke exposure. -Get chest x-ray. Cough medicines and also DuoNeb's. -Recent admission for pneumonia and noted. Already on Rocephin at this time.  #3 atrial fibrillation-continue metoprolol. -On eliquis for anticoagulation  #4 liver cirrhosis-stable. No ascites on exam. Continue rifaximin. Hold lactulose with her diarrhea  #5 gastroparesis-Reglan changed to IV at this time. Also on Bentyl for chronic abdominal pain.  #6 diabetes mellitus-Lantus dose decreased due to being only on clear liquids. Continue sliding scale  insulin.  #7 hypertension-on metoprolol, lisinopril and also Norvasc.  #8 DVT prophylaxis-on eliquis   All the records are reviewed and case discussed with Care Management/Social Workerr. Management plans discussed with the patient, family and they are in agreement.  CODE STATUS: Full Code  TOTAL TIME TAKING CARE OF THIS PATIENT: 38 minutes.   POSSIBLE D/C IN 2 DAYS, DEPENDING ON CLINICAL CONDITION.   Gladstone Lighter M.D on 12/17/2015 at 1:44 PM  Between 7am to 6pm - Pager - [phone]  After 6pm go to [email or web address] - password [email or web address] [facility]  [facility]  Office  [phone]  CC: Primary care physician; [place] Pink, MD

## 2015-12-18 LAB — BASIC METABOLIC PANEL
ANION GAP: 3 — AB (ref 5–15)
BUN: 19 mg/dL (ref 6–20)
CALCIUM: 7.6 mg/dL — AB (ref 8.9–10.3)
CO2: 23 mmol/L (ref 22–32)
Chloride: 107 mmol/L (ref 101–111)
Creatinine, Ser: 0.67 mg/dL (ref 0.44–1.00)
GLUCOSE: 162 mg/dL — AB (ref 65–99)
POTASSIUM: 3.4 mmol/L — AB (ref 3.5–5.1)
SODIUM: 133 mmol/L — AB (ref 135–145)

## 2015-12-18 LAB — GLUCOSE, CAPILLARY
GLUCOSE-CAPILLARY: 158 mg/dL — AB (ref 65–99)
GLUCOSE-CAPILLARY: 201 mg/dL — AB (ref 65–99)
Glucose-Capillary: 204 mg/dL — ABNORMAL HIGH (ref 65–99)
Glucose-Capillary: 180 mg/dL — ABNORMAL HIGH (ref 65–99)

## 2015-12-18 MED ORDER — POTASSIUM CHLORIDE CRYS ER 20 MEQ PO TBCR
40.0000 meq | EXTENDED_RELEASE_TABLET | Freq: Once | ORAL | Status: AC
  Administered 2015-12-18: 40 meq via ORAL
  Filled 2015-12-18: qty 2

## 2015-12-18 MED ORDER — PREDNISONE 50 MG PO TABS
50.0000 mg | ORAL_TABLET | Freq: Every day | ORAL | Status: DC
  Administered 2015-12-19: 50 mg via ORAL
  Filled 2015-12-18: qty 1

## 2015-12-18 NOTE — Progress Notes (Signed)
[facility] [place] NAME: [name]    MR#:  [id number]  DATE OF BIRTH:  May 15, 1956  SUBJECTIVE:  CHIEF COMPLAINT:   Chief Complaint  Patient presents with  . Abdominal Pain  . Nausea  . Emesis   - admitted with colitis, nausea, vomiting and diarrhea and abdominal pain.  -tolerating clears well, still has some abdominal pain, nausea has improved - cough improved. CT chest with pneumonitis changes  REVIEW OF SYSTEMS:  Review of Systems  Constitutional: Negative for fever, chills and malaise/fatigue.  HENT: Negative for ear discharge, ear pain and nosebleeds.   Eyes: Negative for blurred vision and double vision.  Respiratory: Positive for cough, shortness of breath and wheezing.   Cardiovascular: Negative for chest pain and palpitations.  Gastrointestinal: Positive for nausea, vomiting, abdominal pain and diarrhea. Negative for constipation.  Genitourinary: Negative for dysuria and urgency.  Musculoskeletal: Positive for back pain. Negative for myalgias.  Neurological: Negative for dizziness, sensory change, speech change, focal weakness, seizures and headaches.  Psychiatric/Behavioral: Negative for depression.    DRUG ALLERGIES:   Allergies  Allergen Reactions  . Hydrocodone Other (See Comments)    Pt states that this medication caused cirrhosis of the liver.    . Erythromycin Other (See Comments)    Reaction:  Fever   . Prednisone Other (See Comments)    Reaction:  Unknown   . Rosiglitazone Maleate Swelling  . Codeine Sulfate Rash  . Tetanus-Diphtheria Toxoids Td Rash and Other (See Comments)    Reaction:  Fever     VITALS:  Blood pressure 100/58, pulse 112, temperature 99.7 F (37.6 C), temperature source Oral, resp. rate 20, height 5' 3"  (1.6 m), weight 62.097 kg (136 lb 14.4 oz), SpO2 88 %.  PHYSICAL EXAMINATION:  Physical Exam  GENERAL:  60 y.o.-year-old patient lying in the bed with no acute distress.   EYES: Pupils equal, round, reactive to light and accommodation. No scleral icterus. Extraocular muscles intact.  HEENT: Head atraumatic, normocephalic. Oropharynx and nasopharynx clear.  NECK:  Supple, no jugular venous distention. No thyroid enlargement, no tenderness.  LUNGS: Improved wheezing present bilaterally, scattered rhonchi, no rales or crepitation. No use of accessory muscles of respiration.  CARDIOVASCULAR: S1, S2 normal. No murmurs, rubs, or gallops.  ABDOMEN: Soft, some tenderness in the epigastric region,, nondistended. Bowel sounds present. No organomegaly or mass.  EXTREMITIES: No pedal edema, cyanosis, or clubbing.  NEUROLOGIC: Cranial nerves II through XII are intact. Muscle strength 5/5 in all extremities. Sensation intact. Gait not checked.  PSYCHIATRIC: The patient is alert and oriented x 3.  SKIN: No obvious rash, lesion, or ulcer.    LABORATORY PANEL:   CBC  Recent Labs Lab 12/17/15 0452  WBC 13.1*  HGB 10.7*  HCT 32.6*  PLT 150   ------------------------------------------------------------------------------------------------------------------  Chemistries   Recent Labs Lab 12/16/15 1014  12/18/15 0517  NA 135  < > 133*  K 4.3  < > 3.4*  CL 108  < > 107  CO2 17*  < > 23  GLUCOSE 263*  < > 162*  BUN 15  < > 19  CREATININE 0.78  < > 0.67  CALCIUM 9.1  < > 7.6*  AST 56*  --   --   ALT 32  --   --   ALKPHOS 134*  --   --   BILITOT 1.7*  --   --   < > = values in this interval not  displayed. ------------------------------------------------------------------------------------------------------------------  Cardiac Enzymes  Recent Labs Lab 12/16/15 1014  TROPONINI <0.03   ------------------------------------------------------------------------------------------------------------------  RADIOLOGY:  Dg Chest 2 View  12/17/2015  CLINICAL DATA:  Recent onset of cough; admitted yesterday; recently hospitalized for pneumonia; hx/o cirrhosis, htn,  paf, and cancer of uterus EXAM: CHEST  2 VIEW COMPARISON:  12/16/2015 FINDINGS: Since the previous day's study, areas of airspace opacity have developed, most evident in the right upper lobe, but also noted in the right lower lobe and left lower lobe. No pleural effusion or pneumothorax. Cardiac silhouette is normal in size and configuration. No mediastinal or hilar masses or convincing adenopathy. Bony thorax is intact. IMPRESSION: 1. Multifocal pneumonia has become apparent since the previous day's study. Electronically Signed   By: Lajean Manes M.D.   On: 12/17/2015 12:23   Ct Chest Wo Contrast  12/17/2015  CLINICAL DATA:  Diagnosed with pneumonia 1 month ago. Coughing and wheezing has increased over the last couple days. Shortness of breath today. EXAM: CT CHEST WITHOUT CONTRAST TECHNIQUE: Multidetector CT imaging of the chest was performed following the standard protocol without IV contrast. COMPARISON:  Chest x-rays dated 12/17/2015 and 11/14/2015. FINDINGS: Mediastinum/Lymph Nodes: Heart size is upper normal. No pericardial effusion. Scattered coronary artery calcifications noted. No masses or enlarged lymph nodes seen within the mediastinum or perihilar regions. Lungs/Pleura: Patchy consolidations throughout both lungs, involving all lobes, most dense within the right upper lobe and at the left lung base posteriorly. Majority of the consolidations are associated with ground-glass opacities suggesting atypical pneumonia or associated edema. Trachea and central bronchi are unremarkable. Upper abdomen: Free ascites is seen within the upper abdomen. Liver appears cirrhotic. Patient is status post cholecystectomy. Thickening of the walls of the upper right colon appear similar to the appearance on CT abdomen of 12/16/2015 compatible with the description of colitis on the associated report. Musculoskeletal: Mild scoliosis of the thoracic spine. No evidence of acute osseous abnormality. Superficial soft tissues  are unremarkable. IMPRESSION: 1. Multi lobar consolidations, most dense within the right upper lobe and at the left lung base posteriorly, with areas of associated ground-glass opacity suggesting atypical pneumonia and/or edema. Differential includes atypical pneumonias such as viral or fungal, interstitial pneumonias, edema related to volume overload/CHF, chronic interstitial diseases, hypersensitivity pneumonitis, and respiratory bronchiolitis. 2. Upper abdominal ascites, presumably associated with liver cirrhosis. Thickening of the walls of the upper ascending colon similar to its appearance on CT abdomen of 12/16/2015, compatible with the previous description of colitis on the earlier CT report. Electronically Signed   By: Franki Cabot M.D.   On: 12/17/2015 17:13   Ct Abdomen Pelvis W Contrast  12/16/2015  CLINICAL DATA:  Abdominal pain, nausea, vomiting and diarrhea. EXAM: CT ABDOMEN AND PELVIS WITH CONTRAST TECHNIQUE: Multidetector CT imaging of the abdomen and pelvis was performed using the standard protocol following bolus administration of intravenous contrast. CONTRAST:  176m ISOVUE-300 IOPAMIDOL (ISOVUE-300) INJECTION 61% COMPARISON:  Abdominal series today as well as prior CT of the abdomen and pelvis on 11/14/2015. FINDINGS: Lower chest: The visualized lung bases show bibasilar atelectasis. No pleural fluid identified. Hepatobiliary: There again is evidence of cirrhosis with grossly nodular appearance of the liver. There is associated ascites surrounding the liver and also elsewhere in the peritoneal cavity. Overall volume of ascites is small to moderate. There is evidence of portal hypertension with distal esophageal and gastroesophageal varices identified supplied by the left gastric vein as well as probable supply from short gastric veins and a gastro-renal shunt  emanating from the left renal vein. Pancreas: No mass, inflammatory changes, or other significant abnormality. Spleen: Within normal  limits in size and appearance. Adrenals/Urinary Tract: No evidence of hydronephrosis. Nonobstructing calculus in the lower pole of the right kidney present measuring approximately 10 mm in greatest diameter. Stomach/Bowel: There is evidence of new colitis with significant circumferential thickening involving the ascending and transverse colon. Colonic thickening appears extends just into the proximal descending colon. No evidence of bowel perforation or focal abscess. No small bowel dilatation or obstruction. The stomach continues to show evidence of prominent folds at the level of the gastric cardia and GE junction extending into the body of the stomach. Prominence at the level of the gastric cardia is more pronounced compared to prior studies and it may help to correlate this finding with repeat EGD. Vascular/Lymphatic: No pathologically enlarged lymph nodes. No evidence of abdominal aortic aneurysm. Reproductive: The uterus has been removed. Other: No hernias identified. Musculoskeletal: Bony structures show stable spondylosis at L5-S1 a mild leftward convex scoliosis of the lumbar spine. IMPRESSION: 1. New colitis involving the ascending and transverse colon. No evidence of bowel perforation, focal abscess or small bowel obstruction. 2. Cirrhosis and portal hypertension with distal esophageal and gastroesophageal varices identified supplied by probable left gastric and gastro-renal shunts. 3. Some increased prominence of folds at the level of the gastric cardia extending into the body of the stomach. Some of these changes were apparently secondary to prior fundoplication and gastric inflammation by prior EGD. Correlation with repeat EGD may be helpful, especially given the CT evidence of definite distal esophageal varices. Electronically Signed   By: Aletta Edouard M.D.   On: 12/16/2015 15:48    EKG:   Orders placed or performed during the hospital encounter of 12/16/15  . ED EKG  . ED EKG  . EKG  12-Lead  . EKG 12-Lead  . EKG 12-Lead  . EKG 12-Lead    ASSESSMENT AND PLAN:   60 year old female with past medical history significant for liver cirrhosis, gastroparesis, chronic back pain, GERD, anxiety and diabetes mellitus, history of paroxysmal atrial fibrillation presents to the hospital secondary to nausea vomiting and abdominal pain.  #1 acute colitis-continue on clear liquid diet. -On Rocephin and Flagyl at this time. -Diarrhea has improved. Still with nausea vomiting. -Continue conservative treatment.  #2 acute wheezing-no history of any smoking or passive smoke exposure. -Cough medicines and also DuoNeb's. -Recent admission for pneumonia and noted. Already on Rocephin at this time. - CT chest with extensive findings including ultimate lobar consolidations, groundglass opacity suggesting atypical pneumonia or interstitial pneumonia. -Pulmonary consulted. Currently on oxygen and patient uses home oxygen. - discontinue IV fluids, ECHO if hasn't been done  #3 atrial fibrillation-continue metoprolol. -On eliquis for anticoagulation  #4 liver cirrhosis-stable. No ascites on exam. Continue rifaximin. Hold lactulose with her diarrhea  #5 gastroparesis-Reglan changed to IV at this time. Also on Bentyl for chronic abdominal pain.  #6 diabetes mellitus-Lantus dose decreased due to being only on liquids. Continue sliding scale insulin.  #7 hypertension-on metoprolol, lisinopril and also Norvasc.  #8 DVT prophylaxis-on eliquis   All the records are reviewed and case discussed with Care Management/Social Workerr. Management plans discussed with the patient, family and they are in agreement.  CODE STATUS: Full Code  TOTAL TIME TAKING CARE OF THIS PATIENT: 38 minutes.   POSSIBLE D/C IN 2 DAYS, DEPENDING ON CLINICAL CONDITION.   Gladstone Lighter M.D on 12/18/2015 at 12:05 PM  Between 7am to 6pm - Pager -  [phone]  After 6pm go to [email or web address] - password [email or web address]  [facility]  [facility]  Office  [phone]  CC: Primary care physician; [place] Pink, MD

## 2015-12-18 NOTE — Progress Notes (Signed)
Pt feeling better. Diet increased to full liq's. ivf's d/c'd. Pt ambulated in hallway on r/a. Walked 2 loops. Post exertional sat's were 88 % on room air.

## 2015-12-18 NOTE — Progress Notes (Signed)
Pt. Walked 2 laps around nurses station with no SOB or acute distress noted.

## 2015-12-19 LAB — BASIC METABOLIC PANEL
Anion gap: 4 — ABNORMAL LOW (ref 5–15)
BUN: 14 mg/dL (ref 6–20)
CALCIUM: 7.6 mg/dL — AB (ref 8.9–10.3)
CHLORIDE: 107 mmol/L (ref 101–111)
CO2: 22 mmol/L (ref 22–32)
CREATININE: 0.63 mg/dL (ref 0.44–1.00)
GFR calc non Af Amer: >60 mL/min (ref >60)
GLUCOSE: 165 mg/dL — AB (ref 65–99)
Potassium: 3.8 mmol/L (ref 3.5–5.1)
Sodium: 133 mmol/L — ABNORMAL LOW (ref 135–145)

## 2015-12-19 LAB — GLUCOSE, CAPILLARY
Glucose-Capillary: 137 mg/dL — ABNORMAL HIGH (ref 65–99)
Glucose-Capillary: 254 mg/dL — ABNORMAL HIGH (ref 65–99)

## 2015-12-19 MED ORDER — OXYCODONE HCL 5 MG PO TABS: 5.0000 mg | ORAL_TABLET | Freq: Four times a day (QID) | ORAL | Status: DC | PRN

## 2015-12-19 MED ORDER — BENZONATATE 200 MG PO CAPS: 200.0000 mg | ORAL_CAPSULE | Freq: Three times a day (TID) | ORAL | Status: DC | PRN

## 2015-12-19 MED ORDER — METOCLOPRAMIDE HCL 5 MG PO TABS: 5.0000 mg | ORAL_TABLET | Freq: Three times a day (TID) | ORAL | Status: DC

## 2015-12-19 MED ORDER — PREDNISONE 10 MG (21) PO TBPK: 10.0000 mg | ORAL_TABLET | Freq: Every day | ORAL | Status: DC

## 2015-12-19 MED ORDER — METRONIDAZOLE 500 MG PO TABS: 500.0000 mg | ORAL_TABLET | Freq: Three times a day (TID) | ORAL | Status: DC

## 2015-12-19 MED ORDER — IPRATROPIUM-ALBUTEROL 0.5-2.5 (3) MG/3ML IN SOLN: 3.0000 mL | RESPIRATORY_TRACT | Status: DC | PRN

## 2015-12-19 MED ORDER — BUDESONIDE-FORMOTEROL FUMARATE 160-4.5 MCG/ACT IN AERO: 2.0000 | INHALATION_SPRAY | Freq: Two times a day (BID) | RESPIRATORY_TRACT | Status: DC

## 2015-12-19 MED ORDER — ALBUTEROL SULFATE HFA 108 (90 BASE) MCG/ACT IN AERS: 2.0000 | INHALATION_SPRAY | Freq: Four times a day (QID) | RESPIRATORY_TRACT | Status: DC | PRN

## 2015-12-19 MED ORDER — LABETALOL HCL 5 MG/ML IV SOLN
10.0000 mg | INTRAVENOUS | Status: DC | PRN
  Administered 2015-12-19: 10 mg via INTRAVENOUS
  Filled 2015-12-19: qty 4

## 2015-12-19 NOTE — Consult Note (Signed)
   [facility] CM Inpatient Consult   12/19/2015  [name] 02-17-1956 [id number]  Patient screened for potential [facility] services. Patient was eligible for [place] went to bedside to engage patient and patient had already discharged. Noted in chart [place] community case manager had attempted several times to engage patient but patient decided to withdrawal from the program. Patient is not currently active with [facility] care management services.  For questions please contact:   Galilee Pierron RN, [facility] Liaison  [phone]) Business Mobile [phone]) Toll free office

## 2015-12-19 NOTE — Discharge Summary (Signed)
[facility] NAME: [name]    MR#:  [id number]  DATE OF BIRTH:  01/19/1956  DATE OF ADMISSION:  12/16/2015 ADMITTING PHYSICIAN: Loletha Grayer, MD  DATE OF DISCHARGE: 12/19/2015  1:52 PM  PRIMARY CARE PHYSICIAN: Maryland Pink, MD    ADMISSION DIAGNOSIS:  Colitis [K52.9]  DISCHARGE DIAGNOSIS:  Active Problems:   Colitis   SECONDARY DIAGNOSIS:   Past Medical History  Diagnosis Date  . Type 1 diabetes ([place])   . Hypothyroid   . Degenerative disk disease   . Stomach ulcer   . Diverticulitis   . Syncope 01/2015  . Anxiety   . GERD (gastroesophageal reflux disease)   . History of hiatal hernia   . Cancer (HCC)     HX OF CANCER OF UTERUS   . TIA (transient ischemic attack) 02/2015  . Hypertension   . Pancreatitis   . PAF (paroxysmal atrial fibrillation) ([place]) 03/2015    a. new onset 03/2015 in setting of intractable N/V; b. on Eliquis 5 mg bid; c. CHADSVASc 4 (DM, TIA x 2, female)  . Intussusception intestine ([place]) 05/2015  . Stroke ([place])   . Allergy   . Cirrhosis of liver not due to alcohol [facility]) 2016    HOSPITAL COURSE:   60 year old female with past medical history significant for liver cirrhosis, gastroparesis, chronic back pain, GERD, anxiety and diabetes mellitus, history of paroxysmal atrial fibrillation presents to the hospital secondary to nausea vomiting and abdominal pain.  #1 Acute colitis-Improving, tolerating soft diet well today. -On Rocephin and Flagyl in the hospital, discharged on Flagyl. -Diarrhea has improved.  #2 Acute wheezing-no history of any smoking or passive smoke exposure. Diffuse pneumonitis on CT chest- recent admissions for pneumonia Denies any mold or occupational exposure. Discussed with pulmonologist Dr. Stevenson Clinch and started steroid taper and outpatient f/u and repeat CT in 3 months - Also added inhalers at discharge - CT chest with extensive findings including ultimate  lobar consolidations, groundglass opacity suggesting atypical pneumonia or interstitial pneumonia.  #3 atrial fibrillation ad sinus tachycardia-continue metoprolol. -On eliquis for anticoagulation  #4 liver cirrhosis-stable. No ascites on exam. Continue rifaximin  #5 gastroparesis- on reglan and also Also on Bentyl for chronic abdominal pain.  #6 diabetes mellitus- restart home dose of lantus  #7 hypertension-on metoprolol, lisinopril and also Norvasc.  Patient clinically improved. Being discharged today Has home o2.  DISCHARGE CONDITIONS:   Stable  CONSULTS OBTAINED:   None  DRUG ALLERGIES:   Allergies  Allergen Reactions  . Hydrocodone Other (See Comments)    Pt states that this medication caused cirrhosis of the liver.    . Erythromycin Other (See Comments)    Reaction:  Fever   . Prednisone Other (See Comments)    Reaction:  Unknown   . Rosiglitazone Maleate Swelling  . Codeine Sulfate Rash  . Tetanus-Diphtheria Toxoids Td Rash and Other (See Comments)    Reaction:  Fever     DISCHARGE MEDICATIONS:   Discharge Medication List as of 12/19/2015 11:33 AM    START taking these medications   Details  albuterol (PROVENTIL HFA;VENTOLIN HFA) 108 (90 Base) MCG/ACT inhaler Inhale 2 puffs into the lungs every 6 (six) hours as needed for wheezing or shortness of breath., Starting 12/19/2015, Until Discontinued, Normal    benzonatate (TESSALON) 200 MG capsule Take 1 capsule (200 mg total) by mouth 3 (three) times daily as needed for cough., Starting 12/19/2015, Until Discontinued, Normal  budesonide-formoterol (SYMBICORT) 160-4.5 MCG/ACT inhaler Inhale 2 puffs into the lungs 2 (two) times daily., Starting 12/19/2015, Until Discontinued, Normal    metroNIDAZOLE (FLAGYL) 500 MG tablet Take 1 tablet (500 mg total) by mouth 3 (three) times daily. X 7 more days, Starting 12/19/2015, Until Discontinued, Normal    oxyCODONE (ROXICODONE) 5 MG immediate release tablet Take 1-2 tablets  (5-10 mg total) by mouth every 6 (six) hours as needed for moderate pain or severe pain., Starting 12/19/2015, Until Discontinued, Print    predniSONE (STERAPRED UNI-PAK 21 TAB) 10 MG (21) TBPK tablet Take 1 tablet (10 mg total) by mouth daily. Take 5 tabs PO qdaily x 3 days Then 4 tabs PO q daily x 3 days Then 3 tabs PO qdaily x 3 days  Then 2 tabs PO qdaily x 3 days Then 1 tab PO qdaily x 3 days, Starting 12/19/2015, Until Discontinued, Normal      CONTINUE these medications which have CHANGED   Details  metoCLOPramide (REGLAN) 5 MG tablet Take 1 tablet (5 mg total) by mouth 4 (four) times daily -  before meals and at bedtime., Starting 12/19/2015, Until Discontinued, Normal      CONTINUE these medications which have NOT CHANGED   Details  amLODipine (NORVASC) 5 MG tablet Take 5 mg by mouth daily., Until Discontinued, Historical Med    apixaban (ELIQUIS) 5 MG TABS tablet Take 1 tablet (5 mg total) by mouth 2 (two) times daily., Starting 04/07/2015, Until Discontinued, Normal    citalopram (CELEXA) 20 MG tablet Take 20 mg by mouth daily., Until Discontinued, Historical Med    dicyclomine (BENTYL) 10 MG capsule Take 1 capsule by mouth 4 (four) times daily - after meals and at bedtime. , Until Discontinued, Historical Med    insulin aspart (NOVOLOG) 100 UNIT/ML injection Inject into the skin 3 (three) times daily with meals as needed for high blood sugar. Pt uses as needed per sliding scale., Until Discontinued, Historical Med    insulin detemir (LEVEMIR) 100 UNIT/ML injection Inject 0.17 mLs (17 Units total) into the skin daily., Starting 11/16/2015, Until Discontinued, Print    levothyroxine (SYNTHROID, LEVOTHROID) 75 MCG tablet Take 1 tablet (75 mcg total) by mouth daily before breakfast., Starting 10/08/2015, Until Discontinued, Normal    lisinopril (PRINIVIL,ZESTRIL) 5 MG tablet Take 5 mg by mouth daily., Until Discontinued, Historical Med    magnesium oxide (MAG-OX) 400 (241.3 Mg) MG  tablet Take 1 tablet (400 mg total) by mouth 2 (two) times daily., Starting 11/16/2015, Until Discontinued, Print    metoprolol tartrate (LOPRESSOR) 25 MG tablet Take 12.5 mg by mouth 2 (two) times daily., Until Discontinued, Historical Med    morphine (MS CONTIN) 15 MG 12 hr tablet Take 1 tablet (15 mg total) by mouth every 8 (eight) hours., Starting 10/21/2015, Until Discontinued, Print    pantoprazole (PROTONIX) 40 MG tablet Take 40 mg by mouth 2 (two) times daily before a meal., Until Discontinued, Historical Med    rifaximin (XIFAXAN) 550 MG TABS tablet Take 1 tablet (550 mg total) by mouth 2 (two) times daily., Starting 07/11/2015, Until Discontinued, Normal    tiZANidine (ZANAFLEX) 4 MG tablet Take 4 mg by mouth 3 (three) times daily as needed for muscle spasms. , Until Discontinued, Historical Med      STOP taking these medications     aspirin EC 81 MG EC tablet          DISCHARGE INSTRUCTIONS:   1. PCP f/u in 2 weeks 2. Resume  home health services 3. Pulmonary f/u in 2 weeks  If you experience worsening of your admission symptoms, develop shortness of breath, life threatening emergency, suicidal or homicidal thoughts you must seek medical attention immediately by calling 911 or calling your MD immediately  if symptoms less severe.  You Must read complete instructions/literature along with all the possible adverse reactions/side effects for all the Medicines you take and that have been prescribed to you. Take any new Medicines after you have completely understood and accept all the possible adverse reactions/side effects.   Please note  You were cared for by a hospitalist during your hospital stay. If you have any questions about your discharge medications or the care you received while you were in the hospital after you are discharged, you can call the unit and asked to speak with the hospitalist on call if the hospitalist that took care of you is not available. Once you are  discharged, your primary care physician will handle any further medical issues. Please note that NO REFILLS for any discharge medications will be authorized once you are discharged, as it is imperative that you return to your primary care physician (or establish a relationship with a primary care physician if you do not have one) for your aftercare needs so that they can reassess your need for medications and monitor your lab values.    Today   CHIEF COMPLAINT:   Chief Complaint  Patient presents with  . Abdominal Pain  . Nausea  . Emesis    VITAL SIGNS:  Blood pressure 128/67, pulse 100, temperature 97.5 F (36.4 C), temperature source Oral, resp. rate 25, height 5' 3"  (1.6 m), weight 62.097 kg (136 lb 14.4 oz), SpO2 95 %.  I/O:   Intake/Output Summary (Last 24 hours) at 12/19/15 1613 Last data filed at 12/19/15 0953  Gross per 24 hour  Intake    240 ml  Output    525 ml  Net   -285 ml    PHYSICAL EXAMINATION:   Physical Exam  GENERAL: 60 y.o.-year-old patient lying in the bed with no acute distress.  EYES: Pupils equal, round, reactive to light and accommodation. No scleral icterus. Extraocular muscles intact.  HEENT: Head atraumatic, normocephalic. Oropharynx and nasopharynx clear.  NECK: Supple, no jugular venous distention. No thyroid enlargement, no tenderness.  LUNGS: Improved wheezing present bilaterally, scattered rhonchi, no rales or crepitation. No use of accessory muscles of respiration.  CARDIOVASCULAR: S1, S2 normal. No murmurs, rubs, or gallops.  ABDOMEN: Soft, some tenderness in the epigastric region,, nondistended. Bowel sounds present. No organomegaly or mass.  EXTREMITIES: No pedal edema, cyanosis, or clubbing.  NEUROLOGIC: Cranial nerves II through XII are intact. Muscle strength 5/5 in all extremities. Sensation intact. Gait not checked.  PSYCHIATRIC: The patient is alert and oriented x 3.  SKIN: No obvious rash, lesion, or ulcer.   DATA  REVIEW:   CBC  Recent Labs Lab 12/17/15 0452  WBC 13.1*  HGB 10.7*  HCT 32.6*  PLT 150    Chemistries   Recent Labs Lab 12/16/15 1014  12/19/15 0408  NA 135  < > 133*  K 4.3  < > 3.8  CL 108  < > 107  CO2 17*  < > 22  GLUCOSE 263*  < > 165*  BUN 15  < > 14  CREATININE 0.78  < > 0.63  CALCIUM 9.1  < > 7.6*  AST 56*  --   --   ALT 32  --   --  ALKPHOS 134*  --   --   BILITOT 1.7*  --   --   < > = values in this interval not displayed.  Cardiac Enzymes  Recent Labs Lab 12/16/15 1014  TROPONINI <0.03    Microbiology Results  Results for orders placed or performed during the hospital encounter of 12/16/15  Gastrointestinal Panel by PCR , Stool     Status: None   Collection Time: 12/16/15  3:43 PM  Result Value Ref Range Status   Campylobacter species NOT DETECTED NOT DETECTED Final   Plesimonas shigelloides NOT DETECTED NOT DETECTED Final   Salmonella species NOT DETECTED NOT DETECTED Final   Yersinia enterocolitica NOT DETECTED NOT DETECTED Final   Vibrio species NOT DETECTED NOT DETECTED Final   Vibrio cholerae NOT DETECTED NOT DETECTED Final   Enteroaggregative E coli (EAEC) NOT DETECTED NOT DETECTED Final   Enteropathogenic E coli (EPEC) NOT DETECTED NOT DETECTED Final   Enterotoxigenic E coli (ETEC) NOT DETECTED NOT DETECTED Final   Shiga like toxin producing E coli (STEC) NOT DETECTED NOT DETECTED Final   E. coli O157 NOT DETECTED NOT DETECTED Final   Shigella/Enteroinvasive E coli (EIEC) NOT DETECTED NOT DETECTED Final   Cryptosporidium NOT DETECTED NOT DETECTED Final   Cyclospora cayetanensis NOT DETECTED NOT DETECTED Final   Entamoeba histolytica NOT DETECTED NOT DETECTED Final   Giardia lamblia NOT DETECTED NOT DETECTED Final   Adenovirus F40/41 NOT DETECTED NOT DETECTED Final   Astrovirus NOT DETECTED NOT DETECTED Final   Norovirus GI/GII NOT DETECTED NOT DETECTED Final   Rotavirus A NOT DETECTED NOT DETECTED Final   Sapovirus (I, II, IV, and  V) NOT DETECTED NOT DETECTED Final  C difficile quick scan w PCR reflex     Status: None   Collection Time: 12/16/15  3:43 PM  Result Value Ref Range Status   C Diff antigen NEGATIVE NEGATIVE Final   C Diff toxin NEGATIVE NEGATIVE Final   C Diff interpretation Negative for C. difficile  Final    RADIOLOGY:  Ct Chest Wo Contrast  12/17/2015  CLINICAL DATA:  Diagnosed with pneumonia 1 month ago. Coughing and wheezing has increased over the last couple days. Shortness of breath today. EXAM: CT CHEST WITHOUT CONTRAST TECHNIQUE: Multidetector CT imaging of the chest was performed following the standard protocol without IV contrast. COMPARISON:  Chest x-rays dated 12/17/2015 and 11/14/2015. FINDINGS: Mediastinum/Lymph Nodes: Heart size is upper normal. No pericardial effusion. Scattered coronary artery calcifications noted. No masses or enlarged lymph nodes seen within the mediastinum or perihilar regions. Lungs/Pleura: Patchy consolidations throughout both lungs, involving all lobes, most dense within the right upper lobe and at the left lung base posteriorly. Majority of the consolidations are associated with ground-glass opacities suggesting atypical pneumonia or associated edema. Trachea and central bronchi are unremarkable. Upper abdomen: Free ascites is seen within the upper abdomen. Liver appears cirrhotic. Patient is status post cholecystectomy. Thickening of the walls of the upper right colon appear similar to the appearance on CT abdomen of 12/16/2015 compatible with the description of colitis on the associated report. Musculoskeletal: Mild scoliosis of the thoracic spine. No evidence of acute osseous abnormality. Superficial soft tissues are unremarkable. IMPRESSION: 1. Multi lobar consolidations, most dense within the right upper lobe and at the left lung base posteriorly, with areas of associated ground-glass opacity suggesting atypical pneumonia and/or edema. Differential includes atypical  pneumonias such as viral or fungal, interstitial pneumonias, edema related to volume overload/CHF, chronic interstitial diseases, hypersensitivity pneumonitis, and  respiratory bronchiolitis. 2. Upper abdominal ascites, presumably associated with liver cirrhosis. Thickening of the walls of the upper ascending colon similar to its appearance on CT abdomen of 12/16/2015, compatible with the previous description of colitis on the earlier CT report. Electronically Signed   By: Franki Cabot M.D.   On: 12/17/2015 17:13    EKG:   Orders placed or performed during the hospital encounter of 12/16/15  . ED EKG  . ED EKG  . EKG 12-Lead  . EKG 12-Lead  . EKG 12-Lead  . EKG 12-Lead      Management plans discussed with the patient, family and they are in agreement.  CODE STATUS:     Code Status Orders        Start     Ordered   12/16/15 1031  Do not attempt resuscitation (DNR)   Continuous    Question Answer Comment  In the event of cardiac or respiratory ARREST Do not call a "code blue"   In the event of cardiac or respiratory ARREST Do not perform Intubation, CPR, defibrillation or ACLS   In the event of cardiac or respiratory ARREST Use medication by any route, position, wound care, and other measures to relive pain and suffering. May use oxygen, suction and manual treatment of airway obstruction as needed for comfort.   Comments nurse may pronounce      12/16/15 1654    Code Status History    Date Active Date Inactive Code Status Order ID Comments User Context   11/11/2015 11:36 AM 11/16/2015  2:05 PM Full Code [id number]  Samella Parr, NP Inpatient   10/15/2015  1:23 AM 10/16/2015  6:17 PM Full Code [id number]  Lance Coon, MD Inpatient   10/04/2015  8:19 PM 10/08/2015  6:34 PM Full Code [id number]  Bethena Roys, MD Inpatient   08/15/2015  1:05 AM 08/16/2015  8:04 PM Full Code [id number]  Harrie Foreman, MD Inpatient   08/01/2015  1:25 AM 08/06/2015  5:41 PM Full Code [id number]   Harrie Foreman, MD Inpatient   07/04/2015  2:53 AM 07/11/2015  6:33 PM Full Code [id number]  Lytle Butte, MD ED   05/27/2015 10:48 PM 05/31/2015  2:37 PM Full Code [id number]  Rise Patience, MD Inpatient   05/19/2015  1:14 AM 05/22/2015  4:28 PM Full Code [id number]  Rise Patience, MD Inpatient   04/05/2015  9:16 AM 04/07/2015  5:23 PM Full Code [id number]  Bettey Costa, MD Inpatient   04/03/2015 12:31 PM 04/05/2015  9:16 AM Full Code [id number]  Bettey Costa, MD Inpatient   03/11/2015  4:49 PM 03/14/2015  3:50 PM Full Code [id number]  Dustin Flock, MD Inpatient   02/15/2015  4:51 PM 02/17/2015  2:33 PM DNR [id number]  Dustin Flock, MD Inpatient   02/15/2015  4:35 PM 02/15/2015  4:51 PM Full Code [id number]  Dustin Flock, MD Inpatient   01/11/2015  2:40 AM 01/12/2015  6:11 PM Full Code [id number]  Etta Quill, DO ED    Advance Directive Documentation        Most Recent Value   Type of Advance Directive  Out of facility DNR (pink MOST or yellow form)   Pre-existing out of facility DNR order (yellow form or pink MOST form)     "MOST" Form in Place?        TOTAL TIME TAKING CARE OF THIS PATIENT: 37 minutes.    Gladstone Lighter M.D on 12/19/2015 at 4:13 PM  Between 7am  to 6pm - Pager - [phone]  After 6pm go to [email or web address] - password [email or web address] [facility]  [facility]  Office  [phone]  CC: Primary care physician; [place] Pink, MD

## 2015-12-19 NOTE — Progress Notes (Signed)
SATURATION QUALIFICATIONS: (This note is used to comply with regulatory documentation for home oxygen)  Patient Saturations on Room Air at Rest = 93%  Patient Saturations on Room Air while Ambulating = 92%  Patient Saturations on NA Liters of oxygen while Ambulating = NA  [employer]

## 2015-12-19 NOTE — Care Management Important Message (Signed)
Important Message  Patient Details  Name: [name] MRN: [id number] Date of Birth: October 06, 1955   Medicare Important Message Given:  Yes    Juliann Pulse A Elick Aguilera 12/19/2015, 10:24 AM

## 2015-12-19 NOTE — Progress Notes (Signed)
Pt's HR running up into 180's - 190's, Dr. Marcille Blanco paged. 26m of labetalol ordered. Labetalol  Given. Pt alert and oriented with no acute distress noted. Will continue to monitor pt.

## 2015-12-19 NOTE — Progress Notes (Signed)
Patient d/c'd home. Education provided, no questions at this time. Patient picked up by husband. Telemetry removed. [name]

## 2015-12-19 NOTE — Care Management (Signed)
Was contacted by [place] with [facility].  Patient is currently open to agency and followed by SN PT and Aide.  Notified  attending to obtained resumption of care orders

## 2015-12-19 NOTE — Progress Notes (Signed)
Pt. HR now in the 80's-90's, no acute distress noted. Pt. Resting peacefully. Prior to pt's HR increasing she was given dilaudid 43m, IV for pain. Will continue to monitor pt.

## 2015-12-19 NOTE — Progress Notes (Addendum)
Inpatient Diabetes Program Recommendations  AACE/ADA: New Consensus Statement on Inpatient Glycemic Control (2015)  Target Ranges:  Prepandial:   less than 140 mg/dL      Peak postprandial:   less than 180 mg/dL (1-2 hours)      Critically ill patients:  140 - 180 mg/dL  Results for [name], [name] (MRN [id number]) as of 12/19/2015 09:06  Ref. Range 12/18/2015 07:37 12/18/2015 11:30 12/18/2015 15:53 12/18/2015 21:32 12/19/2015 07:36  Glucose-Capillary Latest Ref Range: 65-99 mg/dL 158 (H) 204 (H) 201 (H) 180 (H) 137 (H)   Review of Glycemic Control  Current orders for Inpatient glycemic control: Lantus 8 units QHS, Novolog 0-9 units TID with meals, Novolog 0-5 units QHS  Inpatient Diabetes Program Recommendations: Insulin - Meal Coverage: Please consider ordering Novolog 4 units TID with meals for meal coverage (in addition to Novolog correction scale) if patient is eats at least 50% of meal.  Addendum 12/19/15@13 :27-Spoke with patient about diabetes and home regimen for diabetes control. Patient reports that she is followed by PCP for diabetes management and currently she takes Levemir 17 units QAM and Novolog once daily in the evening based on correction scale as an outpatient for diabetes control. Patient reports that she was diagnosed with diabetes 10 years ago. Patient reports that she is taking insulin as prescribed and that no changes were made with her insulin at her last office visit despite her A1C being 11.1% (per patient). Patient states that she checks her glucose 1 time per day (in the evening) and that her glucose is usually always in the 200's mg/dl range.  Discussed glucose and A1C goals. Discussed importance of checking CBGs and maintaining good CBG control to prevent long-term and short-term complications. Explained how hyperglycemia leads to damage within blood vessels which lead to the common complications seen with uncontrolled diabetes. Stressed to the patient the importance of  improving glycemic control to prevent further complications from uncontrolled diabetes. Discussed impact of nutrition, exercise, stress, sickness, and medications on diabetes control. Patient states that she eats 6 small meals per day. Discussed how an endocrinologst may be able to assist her with improving glycemic control and patient states that she will talk with her PCP about being referred to a local endocrinologist. Encouraged patient to check her glucose 2-4 times per day (before meals and at bedtime) and to keep a log book of glucose readings and insulin taken which she will need to take to doctor appointments. Explained how the doctor she follows up with can use the log book to continue to make insulin adjustments if needed. Patient verbalized understanding of information discussed and she states that she has no further questions at this time related to diabetes.  Thanks, Barnie Alderman, RN, MSN, CDE Diabetes Coordinator Inpatient Diabetes Program [phone] (Team Pager from [place] to [place]) [phone] ([facility] office) [phone] [facility] office) [phone] [facility] office)

## 2015-12-19 NOTE — Care Management (Signed)
It appears that patient is a readmit but review of the chart everywhere it appears that her previous admission was an observation at [facility].  She has had seven admission between [facility] and [facility] over the last 6 months.  This CM has set her up with [facility] in the past but patient did not make herself available for home visits on  one occasion and unsure of the outcome of the July 2016 admission where another referral was made to [facility].  Have reached out to [facility] to determine if patient was opened.  She is followed by [facility]

## 2015-12-23 LAB — HYPERSENSITIVITY PNEUMONITIS
A. FUMIGATUS #1 ABS: NEGATIVE
A. Pullulans Abs: NEGATIVE
MICROPOLYSPORA FAENI IGG: NEGATIVE
PIGEON SERUM ABS: NEGATIVE
THERMOACTINOMYCES VULGARIS IGG: NEGATIVE
Thermoact. Saccharii: NEGATIVE

## 2015-12-23 LAB — ANA COMPREHENSIVE PANEL
Centromere Ab Screen: <0.2 AI (ref 0.0–0.9)
ENA SM AB SER-ACNC: 0.2 AI (ref 0.0–0.9)
Ribonucleic Protein: <0.2 AI (ref 0.0–0.9)
SSA (Ro) (ENA) Antibody, IgG: <0.2 AI (ref 0.0–0.9)
SSB (La) (ENA) Antibody, IgG: <0.2 AI (ref 0.0–0.9)

## 2015-12-27 ENCOUNTER — Inpatient Hospital Stay: Payer: Commercial Managed Care - HMO

## 2015-12-27 ENCOUNTER — Emergency Department: Payer: Commercial Managed Care - HMO

## 2015-12-27 ENCOUNTER — Inpatient Hospital Stay
Admission: EM | Admit: 2015-12-27 | Discharge: 2016-01-01 | DRG: 871 | Disposition: A | Discharge status: Home Health | Payer: Commercial Managed Care - HMO | Attending: Internal Medicine | Admitting: Internal Medicine

## 2015-12-27 DIAGNOSIS — R05 Cough: Secondary | ICD-10-CM | POA: Diagnosis not present

## 2015-12-27 DIAGNOSIS — I10 Essential (primary) hypertension: Secondary | ICD-10-CM | POA: Diagnosis not present

## 2015-12-27 DIAGNOSIS — E876 Hypokalemia: Secondary | ICD-10-CM | POA: Diagnosis present

## 2015-12-27 DIAGNOSIS — I6350 Cerebral infarction due to unspecified occlusion or stenosis of unspecified cerebral artery: Secondary | ICD-10-CM | POA: Diagnosis not present

## 2015-12-27 DIAGNOSIS — D649 Anemia, unspecified: Secondary | ICD-10-CM | POA: Diagnosis present

## 2015-12-27 DIAGNOSIS — Z886 Allergy status to analgesic agent status: Secondary | ICD-10-CM

## 2015-12-27 DIAGNOSIS — I480 Paroxysmal atrial fibrillation: Secondary | ICD-10-CM | POA: Diagnosis present

## 2015-12-27 DIAGNOSIS — Z90710 Acquired absence of both cervix and uterus: Secondary | ICD-10-CM | POA: Diagnosis not present

## 2015-12-27 DIAGNOSIS — I4891 Unspecified atrial fibrillation: Secondary | ICD-10-CM | POA: Diagnosis not present

## 2015-12-27 DIAGNOSIS — A419 Sepsis, unspecified organism: Secondary | ICD-10-CM | POA: Diagnosis not present

## 2015-12-27 DIAGNOSIS — R930 Abnormal findings on diagnostic imaging of skull and head, not elsewhere classified: Secondary | ICD-10-CM | POA: Diagnosis not present

## 2015-12-27 DIAGNOSIS — G459 Transient cerebral ischemic attack, unspecified: Secondary | ICD-10-CM | POA: Diagnosis not present

## 2015-12-27 DIAGNOSIS — K567 Ileus, unspecified: Secondary | ICD-10-CM

## 2015-12-27 DIAGNOSIS — K219 Gastro-esophageal reflux disease without esophagitis: Secondary | ICD-10-CM | POA: Diagnosis present

## 2015-12-27 DIAGNOSIS — Z887 Allergy status to serum and vaccine status: Secondary | ICD-10-CM

## 2015-12-27 DIAGNOSIS — G894 Chronic pain syndrome: Secondary | ICD-10-CM | POA: Diagnosis present

## 2015-12-27 DIAGNOSIS — R42 Dizziness and giddiness: Secondary | ICD-10-CM | POA: Diagnosis not present

## 2015-12-27 DIAGNOSIS — R109 Unspecified abdominal pain: Secondary | ICD-10-CM | POA: Diagnosis present

## 2015-12-27 DIAGNOSIS — R29898 Other symptoms and signs involving the musculoskeletal system: Secondary | ICD-10-CM

## 2015-12-27 DIAGNOSIS — R001 Bradycardia, unspecified: Secondary | ICD-10-CM | POA: Diagnosis present

## 2015-12-27 DIAGNOSIS — K7581 Nonalcoholic steatohepatitis (NASH): Secondary | ICD-10-CM | POA: Diagnosis present

## 2015-12-27 DIAGNOSIS — E119 Type 2 diabetes mellitus without complications: Secondary | ICD-10-CM

## 2015-12-27 DIAGNOSIS — Z66 Do not resuscitate: Secondary | ICD-10-CM | POA: Diagnosis present

## 2015-12-27 DIAGNOSIS — E785 Hyperlipidemia, unspecified: Secondary | ICD-10-CM | POA: Diagnosis present

## 2015-12-27 DIAGNOSIS — R29810 Facial weakness: Secondary | ICD-10-CM | POA: Diagnosis not present

## 2015-12-27 DIAGNOSIS — F419 Anxiety disorder, unspecified: Secondary | ICD-10-CM | POA: Diagnosis present

## 2015-12-27 DIAGNOSIS — R6521 Severe sepsis with septic shock: Secondary | ICD-10-CM | POA: Diagnosis not present

## 2015-12-27 DIAGNOSIS — E039 Hypothyroidism, unspecified: Secondary | ICD-10-CM | POA: Diagnosis present

## 2015-12-27 DIAGNOSIS — Z8249 Family history of ischemic heart disease and other diseases of the circulatory system: Secondary | ICD-10-CM | POA: Diagnosis not present

## 2015-12-27 DIAGNOSIS — Z794 Long term (current) use of insulin: Secondary | ICD-10-CM | POA: Diagnosis not present

## 2015-12-27 DIAGNOSIS — I951 Orthostatic hypotension: Secondary | ICD-10-CM | POA: Diagnosis not present

## 2015-12-27 DIAGNOSIS — K7460 Unspecified cirrhosis of liver: Secondary | ICD-10-CM | POA: Diagnosis present

## 2015-12-27 DIAGNOSIS — R140 Abdominal distension (gaseous): Secondary | ICD-10-CM

## 2015-12-27 DIAGNOSIS — R531 Weakness: Secondary | ICD-10-CM | POA: Diagnosis not present

## 2015-12-27 DIAGNOSIS — R0602 Shortness of breath: Secondary | ICD-10-CM | POA: Diagnosis not present

## 2015-12-27 DIAGNOSIS — M6281 Muscle weakness (generalized): Secondary | ICD-10-CM | POA: Diagnosis not present

## 2015-12-27 DIAGNOSIS — Z9049 Acquired absence of other specified parts of digestive tract: Secondary | ICD-10-CM

## 2015-12-27 DIAGNOSIS — I495 Sick sinus syndrome: Secondary | ICD-10-CM | POA: Diagnosis present

## 2015-12-27 DIAGNOSIS — Z883 Allergy status to other anti-infective agents status: Secondary | ICD-10-CM

## 2015-12-27 DIAGNOSIS — R1013 Epigastric pain: Secondary | ICD-10-CM | POA: Diagnosis not present

## 2015-12-27 DIAGNOSIS — E109 Type 1 diabetes mellitus without complications: Secondary | ICD-10-CM | POA: Diagnosis present

## 2015-12-27 DIAGNOSIS — Z8542 Personal history of malignant neoplasm of other parts of uterus: Secondary | ICD-10-CM

## 2015-12-27 DIAGNOSIS — R1084 Generalized abdominal pain: Secondary | ICD-10-CM | POA: Diagnosis present

## 2015-12-27 DIAGNOSIS — I69354 Hemiplegia and hemiparesis following cerebral infarction affecting left non-dominant side: Secondary | ICD-10-CM | POA: Diagnosis not present

## 2015-12-27 DIAGNOSIS — Z7901 Long term (current) use of anticoagulants: Secondary | ICD-10-CM | POA: Diagnosis not present

## 2015-12-27 DIAGNOSIS — F1110 Opioid abuse, uncomplicated: Secondary | ICD-10-CM | POA: Diagnosis not present

## 2015-12-27 DIAGNOSIS — K529 Noninfective gastroenteritis and colitis, unspecified: Secondary | ICD-10-CM | POA: Diagnosis not present

## 2015-12-27 DIAGNOSIS — N200 Calculus of kidney: Secondary | ICD-10-CM | POA: Diagnosis not present

## 2015-12-27 DIAGNOSIS — R1010 Upper abdominal pain, unspecified: Secondary | ICD-10-CM | POA: Diagnosis not present

## 2015-12-27 LAB — CBC
HEMATOCRIT: 32.6 % — AB (ref 35.0–47.0)
Hemoglobin: 10.7 g/dL — ABNORMAL LOW (ref 12.0–16.0)
MCH: 29.6 pg (ref 26.0–34.0)
MCHC: 32.8 g/dL (ref 32.0–36.0)
MCV: 90.1 fL (ref 80.0–100.0)
Platelets: 152 10*3/uL (ref 150–440)
RBC: 3.61 MIL/uL — AB (ref 3.80–5.20)
RDW: 16.9 % — ABNORMAL HIGH (ref 11.5–14.5)
WBC: 3.7 10*3/uL (ref 3.6–11.0)

## 2015-12-27 LAB — URINALYSIS COMPLETE WITH MICROSCOPIC (ARMC ONLY)
Bilirubin Urine: NEGATIVE
Glucose, UA: 50 mg/dL — AB
Ketones, ur: NEGATIVE mg/dL
NITRITE: NEGATIVE
PROTEIN: 30 mg/dL — AB
SPECIFIC GRAVITY, URINE: 1.018 (ref 1.005–1.030)
pH: 6 (ref 5.0–8.0)

## 2015-12-27 LAB — URINE DRUG SCREEN, QUALITATIVE (ARMC ONLY)
Amphetamines, Ur Screen: NOT DETECTED
BARBITURATES, UR SCREEN: NOT DETECTED
Benzodiazepine, Ur Scrn: NOT DETECTED
CANNABINOID 50 NG, UR ~~LOC~~: NOT DETECTED
COCAINE METABOLITE, UR ~~LOC~~: NOT DETECTED
MDMA (ECSTASY) UR SCREEN: NOT DETECTED
Methadone Scn, Ur: NOT DETECTED
OPIATE, UR SCREEN: NOT DETECTED
PHENCYCLIDINE (PCP) UR S: NOT DETECTED
TRICYCLIC, UR SCREEN: NOT DETECTED

## 2015-12-27 LAB — GLUCOSE, CAPILLARY
GLUCOSE-CAPILLARY: 241 mg/dL — AB (ref 65–99)
Glucose-Capillary: 193 mg/dL — ABNORMAL HIGH (ref 65–99)

## 2015-12-27 LAB — COMPREHENSIVE METABOLIC PANEL
ALBUMIN: 2.9 g/dL — AB (ref 3.5–5.0)
ALT: 20 U/L (ref 14–54)
ANION GAP: 5 (ref 5–15)
AST: 39 U/L (ref 15–41)
Alkaline Phosphatase: 82 U/L (ref 38–126)
BILIRUBIN TOTAL: 1.3 mg/dL — AB (ref 0.3–1.2)
BUN: 9 mg/dL (ref 6–20)
CHLORIDE: 108 mmol/L (ref 101–111)
CO2: 24 mmol/L (ref 22–32)
Calcium: 8.2 mg/dL — ABNORMAL LOW (ref 8.9–10.3)
Creatinine, Ser: 0.62 mg/dL (ref 0.44–1.00)
GFR calc Af Amer: >60 mL/min (ref >60)
GLUCOSE: 233 mg/dL — AB (ref 65–99)
POTASSIUM: 4 mmol/L (ref 3.5–5.1)
Sodium: 137 mmol/L (ref 135–145)
TOTAL PROTEIN: 5.7 g/dL — AB (ref 6.5–8.1)

## 2015-12-27 LAB — DIFFERENTIAL
Basophils Absolute: 0 10*3/uL (ref 0–0.1)
Basophils Relative: 1 %
EOS PCT: 1 %
Eosinophils Absolute: 0 10*3/uL (ref 0–0.7)
LYMPHS ABS: 0.7 10*3/uL — AB (ref 1.0–3.6)
LYMPHS PCT: 18 %
MONO ABS: 0.3 10*3/uL (ref 0.2–0.9)
MONOS PCT: 8 %
NEUTROS ABS: 2.7 10*3/uL (ref 1.4–6.5)
Neutrophils Relative %: 72 %

## 2015-12-27 LAB — TROPONIN I: TROPONIN I: 0.04 ng/mL — AB (ref <0.031)

## 2015-12-27 LAB — PROTIME-INR
INR: 1.37
Prothrombin Time: 17 seconds — ABNORMAL HIGH (ref 11.4–15.0)

## 2015-12-27 LAB — APTT: APTT: 30 s (ref 24–36)

## 2015-12-27 MED ORDER — LISINOPRIL 5 MG PO TABS: 5.0000 mg | ORAL_TABLET | Freq: Every day | ORAL | Status: DC

## 2015-12-27 MED ORDER — INSULIN DETEMIR 100 UNIT/ML ~~LOC~~ SOLN
17.0000 [IU] | Freq: Every day | SUBCUTANEOUS | Status: DC
  Administered 2015-12-27 – 2015-12-31 (×5): 17 [IU] via SUBCUTANEOUS
  Filled 2015-12-27 (×7): qty 0.17

## 2015-12-27 MED ORDER — APIXABAN 5 MG PO TABS
5.0000 mg | ORAL_TABLET | Freq: Two times a day (BID) | ORAL | Status: DC
  Administered 2015-12-27 – 2015-12-30 (×6): 5 mg via ORAL
  Filled 2015-12-27 (×6): qty 1

## 2015-12-27 MED ORDER — BENZONATATE 100 MG PO CAPS
200.0000 mg | ORAL_CAPSULE | Freq: Three times a day (TID) | ORAL | Status: DC | PRN
  Administered 2015-12-31: 200 mg via ORAL
  Filled 2015-12-27: qty 2

## 2015-12-27 MED ORDER — METOCLOPRAMIDE HCL 10 MG PO TABS
5.0000 mg | ORAL_TABLET | Freq: Three times a day (TID) | ORAL | Status: DC
  Administered 2015-12-27: 10 mg via ORAL
  Administered 2015-12-28 – 2016-01-01 (×18): 5 mg via ORAL
  Filled 2015-12-27 (×10): qty 1
  Filled 2015-12-27: qty 0.5
  Filled 2015-12-27 (×2): qty 1
  Filled 2015-12-27: qty 0.5
  Filled 2015-12-27 (×5): qty 1
  Filled 2015-12-27: qty 2
  Filled 2015-12-27 (×2): qty 1

## 2015-12-27 MED ORDER — PANTOPRAZOLE SODIUM 40 MG PO TBEC
40.0000 mg | DELAYED_RELEASE_TABLET | Freq: Two times a day (BID) | ORAL | Status: DC
  Administered 2015-12-28 – 2016-01-01 (×9): 40 mg via ORAL
  Filled 2015-12-27 (×10): qty 1

## 2015-12-27 MED ORDER — METOPROLOL TARTRATE 25 MG PO TABS
25.0000 mg | ORAL_TABLET | Freq: Two times a day (BID) | ORAL | Status: DC
  Administered 2015-12-27: 25 mg via ORAL
  Filled 2015-12-27: qty 1

## 2015-12-27 MED ORDER — CLOPIDOGREL BISULFATE 75 MG PO TABS
75.0000 mg | ORAL_TABLET | Freq: Once | ORAL | Status: AC
  Administered 2015-12-27: 75 mg via ORAL
  Filled 2015-12-27: qty 1

## 2015-12-27 MED ORDER — ASPIRIN 325 MG PO TABS
325.0000 mg | ORAL_TABLET | Freq: Every day | ORAL | Status: DC
  Administered 2015-12-28 – 2015-12-29 (×2): 325 mg via ORAL
  Filled 2015-12-27 (×2): qty 1

## 2015-12-27 MED ORDER — TIZANIDINE HCL 4 MG PO TABS
4.0000 mg | ORAL_TABLET | Freq: Three times a day (TID) | ORAL | Status: DC | PRN
  Administered 2015-12-27: 4 mg via ORAL
  Filled 2015-12-27: qty 1

## 2015-12-27 MED ORDER — INSULIN ASPART 100 UNIT/ML ~~LOC~~ SOLN
0.0000 [IU] | Freq: Three times a day (TID) | SUBCUTANEOUS | Status: DC
  Administered 2015-12-28: 1 [IU] via SUBCUTANEOUS
  Administered 2015-12-28: 2 [IU] via SUBCUTANEOUS
  Administered 2015-12-28: 1 [IU] via SUBCUTANEOUS
  Administered 2015-12-29: 2 [IU] via SUBCUTANEOUS
  Administered 2015-12-29: 1 [IU] via SUBCUTANEOUS
  Administered 2016-01-01: 3 [IU] via SUBCUTANEOUS
  Filled 2015-12-27: qty 1
  Filled 2015-12-27: qty 2
  Filled 2015-12-27: qty 1
  Filled 2015-12-27: qty 3
  Filled 2015-12-27: qty 2

## 2015-12-27 MED ORDER — INSULIN ASPART 100 UNIT/ML ~~LOC~~ SOLN: 0.0000 [IU] | Freq: Every day | SUBCUTANEOUS | Status: DC

## 2015-12-27 MED ORDER — LEVOTHYROXINE SODIUM 75 MCG PO TABS
75.0000 ug | ORAL_TABLET | Freq: Every day | ORAL | Status: DC
  Administered 2015-12-28 – 2015-12-29 (×2): 75 ug via ORAL
  Filled 2015-12-27 (×2): qty 1

## 2015-12-27 MED ORDER — IOPAMIDOL (ISOVUE-300) INJECTION 61%
100.0000 mL | Freq: Once | INTRAVENOUS | Status: AC | PRN
  Administered 2015-12-27: 100 mL via INTRAVENOUS

## 2015-12-27 MED ORDER — RIFAXIMIN 550 MG PO TABS
550.0000 mg | ORAL_TABLET | Freq: Two times a day (BID) | ORAL | Status: DC
  Administered 2015-12-27 – 2016-01-01 (×10): 550 mg via ORAL
  Filled 2015-12-27 (×10): qty 1

## 2015-12-27 MED ORDER — INSULIN ASPART 100 UNIT/ML ~~LOC~~ SOLN
3.0000 [IU] | Freq: Three times a day (TID) | SUBCUTANEOUS | Status: DC
  Administered 2015-12-28 – 2016-01-01 (×14): 3 [IU] via SUBCUTANEOUS
  Filled 2015-12-27 (×14): qty 3

## 2015-12-27 MED ORDER — MORPHINE SULFATE (PF) 4 MG/ML IV SOLN
INTRAVENOUS | Status: AC
  Administered 2015-12-27: 4 mg via INTRAVENOUS
  Filled 2015-12-27: qty 1

## 2015-12-27 MED ORDER — ASPIRIN 300 MG RE SUPP
300.0000 mg | Freq: Every day | RECTAL | Status: DC
  Filled 2015-12-27: qty 1

## 2015-12-27 MED ORDER — CITALOPRAM HYDROBROMIDE 20 MG PO TABS
20.0000 mg | ORAL_TABLET | Freq: Every day | ORAL | Status: DC
  Administered 2015-12-28 – 2016-01-01 (×5): 20 mg via ORAL
  Filled 2015-12-27 (×5): qty 1

## 2015-12-27 MED ORDER — ASPIRIN 81 MG PO CHEW
81.0000 mg | CHEWABLE_TABLET | Freq: Once | ORAL | Status: AC
  Administered 2015-12-27: 81 mg via ORAL
  Filled 2015-12-27: qty 1

## 2015-12-27 MED ORDER — ATORVASTATIN CALCIUM 20 MG PO TABS
40.0000 mg | ORAL_TABLET | Freq: Every day | ORAL | Status: DC
  Administered 2015-12-28 – 2015-12-31 (×4): 40 mg via ORAL
  Filled 2015-12-27 (×5): qty 2

## 2015-12-27 MED ORDER — STROKE: EARLY STAGES OF RECOVERY BOOK
Freq: Once | Status: AC
  Administered 2015-12-27: 23:00:00

## 2015-12-27 MED ORDER — MORPHINE SULFATE (PF) 4 MG/ML IV SOLN
4.0000 mg | Freq: Once | INTRAVENOUS | Status: AC
  Administered 2015-12-27: 4 mg via INTRAVENOUS
  Filled 2015-12-27: qty 1

## 2015-12-27 MED ORDER — DICYCLOMINE HCL 10 MG PO CAPS
10.0000 mg | ORAL_CAPSULE | Freq: Three times a day (TID) | ORAL | Status: DC
  Administered 2015-12-28 – 2016-01-01 (×18): 10 mg via ORAL
  Filled 2015-12-27 (×20): qty 1

## 2015-12-27 MED ORDER — MORPHINE SULFATE (PF) 4 MG/ML IV SOLN
4.0000 mg | Freq: Once | INTRAVENOUS | Status: AC
  Administered 2015-12-27: 4 mg via INTRAVENOUS

## 2015-12-27 MED ORDER — DIATRIZOATE MEGLUMINE & SODIUM 66-10 % PO SOLN
15.0000 mL | Freq: Once | ORAL | Status: AC
  Administered 2015-12-27: 15 mL via ORAL
  Filled 2015-12-27: qty 30

## 2015-12-27 MED ORDER — SODIUM CHLORIDE 0.9 % IV BOLUS (SEPSIS)
500.0000 mL | Freq: Once | INTRAVENOUS | Status: AC
  Administered 2015-12-27: 500 mL via INTRAVENOUS

## 2015-12-27 MED ORDER — MAGNESIUM OXIDE 400 (241.3 MG) MG PO TABS
400.0000 mg | ORAL_TABLET | Freq: Two times a day (BID) | ORAL | Status: DC
  Administered 2015-12-27 – 2016-01-01 (×10): 400 mg via ORAL
  Filled 2015-12-27 (×11): qty 1

## 2015-12-27 MED ORDER — AMLODIPINE BESYLATE 5 MG PO TABS: 5.0000 mg | ORAL_TABLET | Freq: Every day | ORAL | Status: DC

## 2015-12-27 MED ORDER — ALBUTEROL SULFATE (2.5 MG/3ML) 0.083% IN NEBU: 3.0000 mL | INHALATION_SOLUTION | Freq: Four times a day (QID) | RESPIRATORY_TRACT | Status: DC | PRN

## 2015-12-27 MED ORDER — MOMETASONE FURO-FORMOTEROL FUM 200-5 MCG/ACT IN AERO
2.0000 | INHALATION_SPRAY | Freq: Two times a day (BID) | RESPIRATORY_TRACT | Status: DC
  Administered 2015-12-27 – 2016-01-01 (×9): 2 via RESPIRATORY_TRACT
  Filled 2015-12-27 (×2): qty 8.8

## 2015-12-27 NOTE — ED Provider Notes (Signed)
[facility] Emergency Department Provider Note   ____________________________________________  Time seen:  I have reviewed the triage vital signs and the triage nursing note.  HISTORY  Chief Complaint Code Stroke and Abdominal Pain   Historian Limited history due to poor historian, patient with some symptoms of acute stroke.  \ HPI [name] is a 60 y.o. female who presented initially by EMS with the complaint of vomiting and dizziness for couple of days. She's had abdominal pain and vomiting for 2-3 days and this morning states that around 3 AM she woke up and felt weak and dizzy and fell to her knees and kind back in the bed. Then she got up the rest of the day, but around 3 this afternoon she walked to the kitchen and felt lightheaded and then passed out. When she stood back up she felt her left arm and left leg were weak and tingling. She then called EMS  She denies chest pain. She has had abdominal pain and feels like it's been swollen. She has had some diarrhea.   She is on Eloquis.Prior CVA  Symptoms are moderate.    Past Medical History  Diagnosis Date  . Type 1 diabetes ([place])   . Hypothyroid   . Degenerative disk disease   . Stomach ulcer   . Diverticulitis   . Syncope 01/2015  . Anxiety   . GERD (gastroesophageal reflux disease)   . History of hiatal hernia   . Cancer (HCC)     HX OF CANCER OF UTERUS   . TIA (transient ischemic attack) 02/2015  . Hypertension   . Pancreatitis   . PAF (paroxysmal atrial fibrillation) ([place]) 03/2015    a. new onset 03/2015 in setting of intractable N/V; b. on Eliquis 5 mg bid; c. CHADSVASc 4 (DM, TIA x 2, female)  . Intussusception intestine ([place]) 05/2015  . Stroke ([place])   . Allergy   . Cirrhosis of liver not due to alcohol [facility]) 2016    Patient Active Problem List   Diagnosis Date Noted  . Colitis 12/16/2015  . Pneumonia 11/14/2015  . Narcotic withdrawal ([place]) 11/11/2015  . Diabetes mellitus  type 2, insulin dependent ([place]) 11/11/2015  . Orthostatic hypotension   . H/O TIA (transient ischemic attack) and stroke   . Left-sided weakness 10/04/2015  . Expressive aphasia 10/04/2015  . Ileus ([place]) 08/01/2015  . Ascites   . Cryptogenic cirrhosis ([place]) 07/10/2015  . Paroxysmal atrial fibrillation ([place]) 07/10/2015  . C. difficile colitis 07/10/2015  . GI (gastrointestinal bleed) 07/06/2015  . Malnutrition of moderate degree 07/04/2015  . Symptomatic bradycardia 05/27/2015  . Intussusception intestine ([place])   . Hypothyroidism due to amiodarone   . Abdominal pain, chronic, epigastric   . Chronic anemia 05/19/2015  . Thrombocytopenia ([place]) 05/19/2015  . Prolonged QT interval   . Hypomagnesemia   . Narcotic abuse   . Type 2 diabetes mellitus without complication ([place])   . Syncope due to orthostatic hypotension 05/18/2015  . Hypokalemia 04/06/2015  . Hyperlipidemia with target LDL less than 100 04/06/2015  . CVA (cerebral infarction) 02/15/2015  . Essential hypertension 01/12/2015  . Chronically on opiate therapy 01/12/2015  . Gastroparesis 01/12/2015  . DEPRESSION/ANXIETY 06/27/2007  . MYOFASCIAL PAIN SYNDROME 06/27/2007  . Chronic pain syndrome 03/28/2007  . GERD 03/27/2007  . DIVERTICULOSIS, COLON 03/27/2007  . LUMBAR DISC DISPLACEMENT 03/27/2007  . PROTEINURIA 03/27/2007  . UTERINE CANCER, HX OF 03/27/2007    Past Surgical History  Procedure Laterality Date  .  Hernia repair    . Abdominal hysterectomy    . Cholecystectomy    . Esophagogastroduodenoscopy N/A 04/04/2015    Procedure: ESOPHAGOGASTRODUODENOSCOPY (EGD);  Surgeon: Hulen Luster, MD;  Location: [facility] ENDOSCOPY;  Service: Endoscopy;  Laterality: N/A;    Current Outpatient Rx  Name  Route  Sig  Dispense  Refill  . albuterol (PROVENTIL HFA;VENTOLIN HFA) 108 (90 Base) MCG/ACT inhaler   Inhalation   Inhale 2 puffs into the lungs every 6 (six) hours as needed for wheezing or shortness of breath.   1 Inhaler    2   . amLODipine (NORVASC) 5 MG tablet   Oral   Take 5 mg by mouth daily.         Marland Kitchen apixaban (ELIQUIS) 5 MG TABS tablet   Oral   Take 1 tablet (5 mg total) by mouth 2 (two) times daily.   60 tablet   0   . benzonatate (TESSALON) 200 MG capsule   Oral   Take 1 capsule (200 mg total) by mouth 3 (three) times daily as needed for cough.   20 capsule   0   . budesonide-formoterol (SYMBICORT) 160-4.5 MCG/ACT inhaler   Inhalation   Inhale 2 puffs into the lungs 2 (two) times daily.   1 Inhaler   12   . citalopram (CELEXA) 20 MG tablet   Oral   Take 20 mg by mouth daily.         Marland Kitchen dicyclomine (BENTYL) 10 MG capsule   Oral   Take 1 capsule by mouth 4 (four) times daily - after meals and at bedtime.          . insulin aspart (NOVOLOG) 100 UNIT/ML injection   Subcutaneous   Inject into the skin 3 (three) times daily with meals as needed for high blood sugar. Pt uses as needed per sliding scale.         . insulin detemir (LEVEMIR) 100 UNIT/ML injection   Subcutaneous   Inject 0.17 mLs (17 Units total) into the skin daily.   10 mL   11   . levothyroxine (SYNTHROID, LEVOTHROID) 75 MCG tablet   Oral   Take 1 tablet (75 mcg total) by mouth daily before breakfast.   30 tablet   1   . lisinopril (PRINIVIL,ZESTRIL) 5 MG tablet   Oral   Take 5 mg by mouth daily.         . magnesium oxide (MAG-OX) 400 (241.3 Mg) MG tablet   Oral   Take 1 tablet (400 mg total) by mouth 2 (two) times daily.   10 tablet   0   . metoCLOPramide (REGLAN) 5 MG tablet   Oral   Take 1 tablet (5 mg total) by mouth 4 (four) times daily -  before meals and at bedtime.   90 tablet   0   . metoprolol tartrate (LOPRESSOR) 25 MG tablet   Oral   Take 12.5 mg by mouth 2 (two) times daily.         . metroNIDAZOLE (FLAGYL) 500 MG tablet   Oral   Take 1 tablet (500 mg total) by mouth 3 (three) times daily. X 7 more days   21 tablet   0   . morphine (MS CONTIN) 15 MG 12 hr tablet   Oral    Take 1 tablet (15 mg total) by mouth every 8 (eight) hours.   75 tablet   0   . oxyCODONE (ROXICODONE) 5 MG immediate release tablet  Oral   Take 1-2 tablets (5-10 mg total) by mouth every 6 (six) hours as needed for moderate pain or severe pain.   20 tablet   0   . pantoprazole (PROTONIX) 40 MG tablet   Oral   Take 40 mg by mouth 2 (two) times daily before a meal.         . predniSONE (STERAPRED UNI-PAK 21 TAB) 10 MG (21) TBPK tablet   Oral   Take 1 tablet (10 mg total) by mouth daily. Take 5 tabs PO qdaily x 3 days Then 4 tabs PO q daily x 3 days Then 3 tabs PO qdaily x 3 days  Then 2 tabs PO qdaily x 3 days Then 1 tab PO qdaily x 3 days   45 tablet   0   . rifaximin (XIFAXAN) 550 MG TABS tablet   Oral   Take 1 tablet (550 mg total) by mouth 2 (two) times daily.   60 tablet   6   . tiZANidine (ZANAFLEX) 4 MG tablet   Oral   Take 4 mg by mouth 3 (three) times daily as needed for muscle spasms.            Allergies Hydrocodone; Erythromycin; Prednisone; Rosiglitazone maleate; Codeine sulfate; and Tetanus-diphtheria toxoids td  Family History  Problem Relation Age of Onset  . Hypertension Mother   . CAD Sister   . Heart attack Sister     Deceased 11-30-2014  . CAD Brother     Social History Social History  Substance Use Topics  . Smoking status: Never Smoker   . Smokeless tobacco: Never Used  . Alcohol Use: No    Review of Systems Poor historian Constitutional: Negative for fever. Eyes: Negative for visual changes. ENT: Negative for sore throat. Cardiovascular: Negative for chest pain. Respiratory: Negative for shortness of breath. Gastrointestinal: No bloody emesis, watery emesis. Genitourinary: Negative for dysuria. Musculoskeletal: Negative for back pain. Skin: Negative for rash. Neurological: Negative for headache. 10 point Review of Systems otherwise negative ____________________________________________   PHYSICAL EXAM:  VITAL SIGNS: ED  Triage Vitals  Enc Vitals Group     BP 12/27/15 1644 159/99 mmHg     Pulse Rate 12/27/15 1702 114     Resp 12/27/15 1715 14     Temp 12/27/15 1714 99.2 F (37.3 C)     Temp Source 12/27/15 1714 Oral     SpO2 12/27/15 1702 100 %     Weight --      Height --      Head Cir --      Peak Flow --      Pain Score 12/27/15 1645 5     Pain Loc --      Pain Edu? --      Excl. in [place]? --      Constitutional: Alert and Cooperative but poor historian.. Well appearing and in no distress. HEENT   Head: Normocephalic and atraumatic.      Eyes: Conjunctivae are normal. PERRL. Normal extraocular movements.      Ears:         Nose: No congestion/rhinnorhea.   Mouth/Throat: Mucous membranes are moist.   Neck: No stridor. Cardiovascular/Chest: Normal rate, regular rhythm.  No murmurs, rubs, or gallops. Respiratory: Normal respiratory effort without tachypnea nor retractions. Breath sounds are clear and equal bilaterally. No wheezes/rales/rhonchi. Gastrointestinal: Soft. Distended and diffusely tender, but no guarding or rebound.  Genitourinary/rectal:Deferred Musculoskeletal: Nontender with normal range of motion in all extremities. No  joint effusions.  No lower extremity tenderness.  No edema. Neurologic: Some stuttering of speech. Left facial droop mild. Left upper extremity and left lower extremity weakness, left upper extremity 4/5 and left lower extremity 3 out of 5. Paresthesia left lower extremity. Skin:  Skin is warm, dry and intact. No rash noted.  ____________________________________________   EKG I, Lisa Roca, MD, the attending physician have personally viewed and interpreted all ECGs.  110 bpm.  sinus rhythm. Normal axis. Nonspecific intraventricular conduction delay. Nonspecific T-wave ____________________________________________  LABS (pertinent positives/negatives)  INR 1.37, platelet count 3.7, hemoglobin 10.7 and platelet count 152 Urine drug screen  negative Urinalysis negative for ketones. Too numerous to count squamous epithelial cells. Rare bacteria and trace leukocytes  ____________________________________________  RADIOLOGY All Xrays were viewed by me. Imaging interpreted by Radiologist.  Acute abdomen with chest:  IMPRESSION: 1. Cardiomegaly and pulmonary venous congestion without definitive evidence of acute cardiopulmonary disease on this hypoventilated examination. 2. Nonspecific mild gaseous distention of several loops of small bowel without definite evidence of enteric obstruction. 3. Indeterminate punctate (approximately 5 mm) opacity overlies the right mid abdomen, while potentially radiopaque ingested debris, potentially renal stone could have a similar appearance. Clinical correlation is advised. Further evaluation with noncontrast abdominal CT could be performed as indicated.  CT without contrast:   IMPRESSION: No acute intracranial abnormalities.  Findings called to Dr. Reita Cliche on 12/27/2015 at 1703 hours.  CT abdomen and pelvis with contrast: Pending __________________________________________  PROCEDURES  Procedure(s) performed: None  Critical Care performed: CRITICAL CARE Performed by: Lisa Roca   Total critical care time: 60 minutes  Critical care time was exclusive of separately billable procedures and treating other patients.  Critical care was necessary to treat or prevent imminent or life-threatening deterioration.  Critical care was time spent personally by me on the following activities: development of treatment plan with patient and/or surrogate as well as nursing, discussions with consultants, evaluation of patient's response to treatment, examination of patient, obtaining history from patient or surrogate, ordering and performing treatments and interventions, ordering and review of laboratory studies, ordering and review of radiographic studies, pulse oximetry and re-evaluation of  patient's condition.   ____________________________________________   ED COURSE / ASSESSMENT AND PLAN  Pertinent labs & imaging results that were available during my care of the patient were reviewed by me and considered in my medical decision making (see chart for details).   This patient arrived with the initial complaint of dizziness and vomiting, most clinically concerning for dehydration, but when she got here it turns out that potential he 2 hours ago she had onset of left arm and left leg weakness and paresthesia. Here she does have left upper and left lower extremity weakness as well as paresthesia. She does have a bit of a left facial droop. Her stroke score is 4. A code stroke was initiated.  Patient was sent for CT scan I did receive a phone call from the radiologist reporting no acute finding.  The specialist on-call neurologist evaluated the patient and did not recommend TPA given the fact that the time of onset is a little unclear.  He did recommend stopping Eliquis and starting aspirin and Plavix. He recommended MRA head and neck.  In terms of the patient's abdominal pain, abdominal distention, and vomiting, her x-ray shows some mild distended bowel loops, and I will obtain a CT scan of the abdomen and pelvis. This is pending as well as MRI of the head and neck at time of  hospitalist consultation.    CONSULTATIONS:   Hospitalist for admission, discussed with Dr. Manuella Ghazi.   Patient / Family / Caregiver informed of clinical course, medical decision-making process, and agree with plan.     ___________________________________________   FINAL CLINICAL IMPRESSION(S) / ED DIAGNOSES   Final diagnoses:  Weakness of left arm  Weakness on left side of face  Weakness of left leg  Generalized abdominal pain              Note: This dictation was prepared with Dragon dictation. Any transcriptional errors that result from this process are unintentional   Lisa Roca, MD 12/27/15 2015

## 2015-12-27 NOTE — ED Notes (Addendum)
Pt returned from CT. Developed slurred speech. Pt requesting pain medications. EDP made aware

## 2015-12-27 NOTE — ED Notes (Signed)
Lab called due to hemolyzed specimens, Reordered and recollected labs

## 2015-12-27 NOTE — ED Notes (Signed)
Pt from home via EMS, reports abd pain X4 days with N/V/D reports it feels like the last time she had C.diff. Pt also reports at 1430 she began to have Left sided weakness. Hx of TIA and A.Fib MD at bedside

## 2015-12-27 NOTE — H&P (Signed)
[facility] NAME: [name]    MR#:  [id number]  DATE OF BIRTH:  02/13/1956  DATE OF ADMISSION:  12/27/2015  PRIMARY CARE PHYSICIAN: Maryland Pink, MD   REQUESTING/REFERRING PHYSICIAN: Lisa Roca, MD  CHIEF COMPLAINT:   Chief Complaint  Patient presents with  . Code Stroke  . Abdominal Pain    HISTORY OF PRESENT ILLNESS:  [name]  is a 60 y.o. female with a known history of DM, Hypothyroidism with the complaint of vomiting and dizziness for couple of days. She's had abdominal pain and vomiting for 2-3 days and this morning states that around 3 AM she woke up and felt weak and dizzy and fell to her knees and on lt side. Then she got up the rest of the day, but around 3 this afternoon she walked to the kitchen and felt lightheaded and then passed out. When she stood back up she felt her left arm and left leg were weak and tingling.  She is also reporting abdominal pain and loose stool.  Her diarrhea has been going on since Friday.  Please fortified times a day, watery nature.  Mustard color and mucousy.  It smells really bad that she is aware about possible C. difficile, which she had in the past.  While in the ED, she still has some weakness of the left upper and lower extremity for which she is being admitted for further evaluation and management. PAST MEDICAL HISTORY:   Past Medical History  Diagnosis Date  . Type 1 diabetes ([place])   . Hypothyroid   . Degenerative disk disease   . Stomach ulcer   . Diverticulitis   . Syncope 01/2015  . Anxiety   . GERD (gastroesophageal reflux disease)   . History of hiatal hernia   . Cancer (HCC)     HX OF CANCER OF UTERUS   . TIA (transient ischemic attack) 02/2015  . Hypertension   . Pancreatitis   . PAF (paroxysmal atrial fibrillation) ([place]) 03/2015    a. new onset 03/2015 in setting of intractable N/V; b. on Eliquis 5 mg bid; c. CHADSVASc 4 (DM, TIA x 2, female)  .  Intussusception intestine ([place]) 05/2015  . Stroke ([place])   . Allergy   . Cirrhosis of liver not due to alcohol ([place]) 12-02-14    PAST SURGICAL HISTORY:   Past Surgical History  Procedure Laterality Date  . Hernia repair    . Abdominal hysterectomy    . Cholecystectomy    . Esophagogastroduodenoscopy N/A 04/04/2015    Procedure: ESOPHAGOGASTRODUODENOSCOPY (EGD);  Surgeon: Hulen Luster, MD;  Location: [facility] ENDOSCOPY;  Service: Endoscopy;  Laterality: N/A;    SOCIAL HISTORY:   Social History  Substance Use Topics  . Smoking status: Never Smoker   . Smokeless tobacco: Never Used  . Alcohol Use: No    FAMILY HISTORY:   Family History  Problem Relation Age of Onset  . Hypertension Mother   . CAD Sister   . Heart attack Sister     Deceased December 02, 2014  . CAD Brother     DRUG ALLERGIES:   Allergies  Allergen Reactions  . Hydrocodone Other (See Comments)    Pt states that this medication caused cirrhosis of the liver.    . Erythromycin Other (See Comments)    Reaction:  Fever   . Prednisone Other (See Comments)    Reaction:  Unknown   . Rosiglitazone Maleate Swelling  .  Codeine Sulfate Rash  . Tetanus-Diphtheria Toxoids Td Rash and Other (See Comments)    Reaction:  Fever     REVIEW OF SYSTEMS:   Review of Systems  Constitutional: Negative for fever, weight loss, malaise/fatigue and diaphoresis.  HENT: Negative for ear discharge, ear pain, hearing loss, nosebleeds, sore throat and tinnitus.   Eyes: Negative for blurred vision and pain.  Respiratory: Negative for cough, hemoptysis, shortness of breath and wheezing.   Cardiovascular: Negative for chest pain, palpitations, orthopnea and leg swelling.  Gastrointestinal: Positive for nausea, vomiting, abdominal pain and diarrhea. Negative for heartburn, constipation and blood in stool.  Genitourinary: Negative for dysuria, urgency and frequency.  Musculoskeletal: Negative for myalgias and back pain.  Skin: Negative for itching and  rash.  Neurological: Positive for focal weakness. Negative for dizziness, tingling, tremors, seizures, weakness and headaches.  Psychiatric/Behavioral: Negative for depression. The patient is not nervous/anxious.     MEDICATIONS AT HOME:   Prior to Admission medications   Medication Sig Start Date End Date Taking? Authorizing Provider  albuterol (PROVENTIL HFA;VENTOLIN HFA) 108 (90 Base) MCG/ACT inhaler Inhale 2 puffs into the lungs every 6 (six) hours as needed for wheezing or shortness of breath. 12/19/15  Yes Gladstone Lighter, MD  amLODipine (NORVASC) 5 MG tablet Take 5 mg by mouth daily.   Yes Historical Provider, MD  apixaban (ELIQUIS) 5 MG TABS tablet Take 1 tablet (5 mg total) by mouth 2 (two) times daily. 04/07/15  Yes Bettey Costa, MD  benzonatate (TESSALON) 200 MG capsule Take 1 capsule (200 mg total) by mouth 3 (three) times daily as needed for cough. 12/19/15  Yes Gladstone Lighter, MD  budesonide-formoterol (SYMBICORT) 160-4.5 MCG/ACT inhaler Inhale 2 puffs into the lungs 2 (two) times daily. 12/19/15  Yes Gladstone Lighter, MD  citalopram (CELEXA) 20 MG tablet Take 20 mg by mouth daily.   Yes Historical Provider, MD  dicyclomine (BENTYL) 10 MG capsule Take 10 mg by mouth 4 (four) times daily - after meals and at bedtime.    Yes Historical Provider, MD  insulin aspart (NOVOLOG) 100 UNIT/ML injection Inject into the skin 3 (three) times daily with meals as needed for high blood sugar. Pt uses as needed per sliding scale.   Yes Historical Provider, MD  insulin detemir (LEVEMIR) 100 UNIT/ML injection Inject 17 Units into the skin at bedtime.   Yes Historical Provider, MD  levothyroxine (SYNTHROID, LEVOTHROID) 75 MCG tablet Take 1 tablet (75 mcg total) by mouth daily before breakfast. 10/08/15  Yes Loleta Chance, MD  lisinopril (PRINIVIL,ZESTRIL) 5 MG tablet Take 5 mg by mouth daily.   Yes Historical Provider, MD  magnesium oxide (MAG-OX) 400 (241.3 Mg) MG tablet Take 1 tablet (400 mg total) by  mouth 2 (two) times daily. 11/16/15  Yes Belkys A Regalado, MD  metoCLOPramide (REGLAN) 5 MG tablet Take 1 tablet (5 mg total) by mouth 4 (four) times daily -  before meals and at bedtime. 12/19/15  Yes Gladstone Lighter, MD  metoprolol tartrate (LOPRESSOR) 25 MG tablet Take 25 mg by mouth 2 (two) times daily.    Yes Historical Provider, MD  morphine (MS CONTIN) 15 MG 12 hr tablet Take 1 tablet (15 mg total) by mouth every 8 (eight) hours. 10/21/15  Yes Lance Bosch, MD  oxyCODONE (OXY IR/ROXICODONE) 5 MG immediate release tablet Take 5-10 mg by mouth every 6 (six) hours as needed for severe pain.   Yes Historical Provider, MD  pantoprazole (PROTONIX) 40 MG tablet Take 40 mg by  mouth 2 (two) times daily before a meal.   Yes Historical Provider, MD  predniSONE (DELTASONE) 10 MG tablet Take 10-50 mg by mouth daily with breakfast. Pt is to take as a taper:  5 tablets for three days, 4 tablets for three days, 3 tablets for three days, 2 tablets for three days, 1 tablet for three days, then stop. 12/19/15 01/03/16 Yes Historical Provider, MD  rifaximin (XIFAXAN) 550 MG TABS tablet Take 1 tablet (550 mg total) by mouth 2 (two) times daily. 07/11/15  Yes Theodoro Grist, MD  tiZANidine (ZANAFLEX) 4 MG tablet Take 4 mg by mouth 3 (three) times daily as needed for muscle spasms.    Yes Historical Provider, MD  metroNIDAZOLE (FLAGYL) 500 MG tablet Take 1 tablet (500 mg total) by mouth 3 (three) times daily. X 7 more days Patient not taking: Reported on 12/27/2015 12/19/15   Gladstone Lighter, MD      VITAL SIGNS:  Blood pressure 130/78, pulse 112, temperature 98.7 F (37.1 C), temperature source Oral, resp. rate 18, SpO2 99 %.  PHYSICAL EXAMINATION:  Physical Exam  Constitutional: She is oriented to person, place, and time and well-developed, well-nourished, and in no distress.  HENT:  Head: Normocephalic and atraumatic.  Eyes: Conjunctivae and EOM are normal. Pupils are equal, round, and reactive to light.   Neck: Normal range of motion. Neck supple. No tracheal deviation present. No thyromegaly present.  Cardiovascular: Normal rate, regular rhythm and normal heart sounds.   Pulmonary/Chest: Effort normal and breath sounds normal. No respiratory distress. She has no wheezes. She exhibits no tenderness.  Abdominal: Soft. Bowel sounds are normal. She exhibits no distension. There is generalized tenderness.  Musculoskeletal: Normal range of motion.  Neurological: She is alert and oriented to person, place, and time. She displays weakness. No cranial nerve deficit.  Lt > Rt   Skin: Skin is warm and dry. No rash noted.  Psychiatric: Mood and affect normal.   LABORATORY PANEL:   CBC  Recent Labs Lab 12/27/15 1709  WBC 3.7  HGB 10.7*  HCT 32.6*  PLT 152   ------------------------------------------------------------------------------------------------------------------  Chemistries   Recent Labs Lab 12/27/15 1804  NA 137  K 4.0  CL 108  CO2 24  GLUCOSE 233*  BUN 9  CREATININE 0.62  CALCIUM 8.2*  AST 39  ALT 20  ALKPHOS 82  BILITOT 1.3*   ------------------------------------------------------------------------------------------------------------------  Cardiac Enzymes  Recent Labs Lab 12/27/15 1804  TROPONINI 0.04*   ------------------------------------------------------------------------------------------------------------------  RADIOLOGY:  Ct Head Wo Contrast  12/27/2015  CLINICAL DATA:  Abdominal pain for 4 days, nausea, vomiting, diarrhea, onset of LEFT-sided weakness at 1430 hours, history TIA, atrial fibrillation, diabetes mellitus, paroxysmal atrial fibrillation, essential hypertension, uterine cancer EXAM: CT HEAD WITHOUT CONTRAST TECHNIQUE: Contiguous axial images were obtained from the base of the skull through the vertex without intravenous contrast. COMPARISON:  11/11/2015 FINDINGS: Mild atrophy. Normal ventricular morphology. No midline shift or mass effect.  Otherwise normal appearance of brain parenchyma. No intracranial hemorrhage, mass lesion, or evidence acute infarction. No extra-axial fluid collections. Visualized paranasal sinuses and mastoid air cells clear. Mild hyperostosis frontalis interna. No acute osseous findings. IMPRESSION: No acute intracranial abnormalities. Findings called to Dr. Reita Cliche on 12/27/2015 at 1703 hours. Electronically Signed   By: Lavonia Dana M.D.   On: 12/27/2015 17:04   Ct Abdomen Pelvis W Contrast  12/27/2015  CLINICAL DATA:  Vomiting and dizziness. Abdominal pain. Weakness. Lightheadedness. Cirrhosis. EXAM: CT ABDOMEN AND PELVIS WITH CONTRAST TECHNIQUE: Multidetector CT  imaging of the abdomen and pelvis was performed using the standard protocol following bolus administration of intravenous contrast. CONTRAST:  168m ISOVUE-300 IOPAMIDOL (ISOVUE-300) INJECTION 61% COMPARISON:  Multiple exams, including 12/16/2015 CT scan FINDINGS: Lower chest: Mild interstitial accentuation in both lower lobes, similar to 12/16/2015. Cardiomegaly with left ventricular hypertrophy. Trace left pleural effusion. Trace pericardial effusion. Small uphill varices adjacent to the distal esophagus. Hepatobiliary: Advanced cirrhosis. No focal liver mass seen. Cholecystectomy. Pancreas: Unremarkable Spleen: Unremarkable Adrenals/Urinary Tract: Adrenal glands normal. Right kidney lower pole 9 mm calculus, not appreciably changed. Small hypodense lesions of the right kidney are statistically likely to be cysts but technically too small to characterize. No hydronephrosis, ureteral calculus, or bladder calculus seen. Stomach/Bowel: Wall thickening of the stomach along the gastric cardia, somewhat resembling a fundoplication, correlate with operative history. Several mildly dilated loops of jejunum in the left abdomen, without a transition point to suggest obstruction. Equivocal wall thickening in the ascending colon. Vascular/Lymphatic: Aortoiliac atherosclerotic  vascular disease. Gastric varices. Splenorenal shunting. Portal vein and splenic vein patent. No pathologic adenopathy identified. Reproductive: Uterus absent.  Ovaries not well seen. Other: Considerable ascites, increased from the exam 11 days ago. Increased mesenteric and subcutaneous edema. Musculoskeletal: Healing fractures the right anterior fourth, fifth, sixth, seventh, and eighth ribs with associated rib sclerosis, not appreciably changed from 12/16/2015. IMPRESSION: 1. Compared to the exam from 11 days ago, there is worsening third spacing of fluid with diffuse mesenteric and subcutaneous edema along with increase in ascites. 2. Cirrhosis. 3. Trace left pleural effusion and trace pericardial effusion. 4. Healing anterior fourth through eighth rib fractures on the right. 5. Cardiomegaly with left ventricular hypertrophy. 6. The uphill esophageal varices and gastric varices. Splenorenal shunting. 7. Nonobstructive right kidney lower pole 9 mm calculus. 8. Wall thickening along the gastric cardia, query prior fundoplication. 9. Equivocal wall thickening in the ascending colon, possibly from third spacing of fluid or mild proximal colitis. 10.  Aortoiliac atherosclerotic vascular disease. Electronically Signed   By: WVan ClinesM.D.   On: 12/27/2015 21:19   Dg Abd Acute W/chest  12/27/2015  CLINICAL DATA:  Upper abdominal pain for the past 4 days. History of atrial fibrillation. EXAM: DG ABDOMEN ACUTE W/ 1V CHEST COMPARISON:  12/17/2015; 12/16/2015 FINDINGS: Grossly unchanged enlarged cardiac silhouette and mediastinal contours given persistently reduced lung volumes. Minimal bilateral infrahilar opacities favored to represent atelectasis. Mild pulmonary venous congestion without frank evidence of edema. No pleural effusion or pneumothorax. Mild gas distention of multiple loops of small bowel without definitive evidence of enteric obstruction. Unremarkable colonic stool burden. No pneumoperitoneum,  pneumatosis or portal venous gas. Indeterminate punctate (approximately 5 mm) opacity overlying the right mid abdomen. Several phleboliths overlie the lower pelvis bilaterally. Mild to moderate scoliotic curvature of the thoracolumbar spine with dominant caudal component convex to the left. No acute osseous abnormalities. IMPRESSION: 1. Cardiomegaly and pulmonary venous congestion without definitive evidence of acute cardiopulmonary disease on this hypoventilated examination. 2. Nonspecific mild gaseous distention of several loops of small bowel without definite evidence of enteric obstruction. 3. Indeterminate punctate (approximately 5 mm) opacity overlies the right mid abdomen, while potentially radiopaque ingested debris, potentially renal stone could have a similar appearance. Clinical correlation is advised. Further evaluation with noncontrast abdominal CT could be performed as indicated. Electronically Signed   By: JSandi MariscalM.D.   On: 12/27/2015 18:12   IMPRESSION AND PLAN:  60year old female being admitted for possible acute stroke   * Lt weakness - We will  get an MRI of the brain and MRA of the head and neck - CT Head is negative in the emergency room - Aspirin and statin - Echo, lipid profile  * Abdominal pain and diarrhea - CT scan, not quite impressive for any acute pathology - Considering her 4-5 loose watery stool and bad smell.  We will go and order stool for C. Difficile  * Possible narcotics abuse - UDS neg  * Diabetes: - Continue home dose of insulin.  Consult diabetic nurse coordinator - Check A1c, add sliding scale insulin    All the records are reviewed and case discussed with ED provider. Management plans discussed with the patient, family and they are in agreement.  CODE STATUS: DO NOT RESUSCITATE  TOTAL TIME TAKING CARE OF THIS PATIENT: 45 minutes.    [facility], Makael Stein M.D on 12/27/2015 at 9:23 PM  Between 7am to 6pm - Pager - [phone]  After 6pm go to  [email or web address] - password [email or web address]  Office  [phone]  CC: Primary care physician; [place] Pink, MD   Note: This dictation was prepared with Dragon dictation along with smaller phrase technology. Any transcriptional errors that result from this process are unintentional.

## 2015-12-27 NOTE — ED Notes (Signed)
Soc  Report  Given  To  Dr  Reita Cliche

## 2015-12-27 NOTE — ED Notes (Signed)
Code  Stroke  Called  To [phone] 

## 2015-12-27 NOTE — ED Notes (Addendum)
Patient transported to X-ray 

## 2015-12-27 NOTE — ED Notes (Signed)
Called 2A to give report, left name and ascom number for nurse to call back

## 2015-12-28 ENCOUNTER — Inpatient Hospital Stay: Payer: Commercial Managed Care - HMO

## 2015-12-28 ENCOUNTER — Inpatient Hospital Stay: Admit: 2015-12-28 | Payer: Commercial Managed Care - HMO

## 2015-12-28 ENCOUNTER — Inpatient Hospital Stay (HOSPITAL_COMMUNITY)
Admit: 2015-12-28 | Discharge: 2015-12-28 | Disposition: A | Discharge status: Home / Self Care | Payer: Commercial Managed Care - HMO | Attending: Internal Medicine | Admitting: Internal Medicine

## 2015-12-28 DIAGNOSIS — R001 Bradycardia, unspecified: Secondary | ICD-10-CM

## 2015-12-28 DIAGNOSIS — E876 Hypokalemia: Secondary | ICD-10-CM

## 2015-12-28 DIAGNOSIS — I480 Paroxysmal atrial fibrillation: Secondary | ICD-10-CM

## 2015-12-28 DIAGNOSIS — I6350 Cerebral infarction due to unspecified occlusion or stenosis of unspecified cerebral artery: Secondary | ICD-10-CM

## 2015-12-28 DIAGNOSIS — I495 Sick sinus syndrome: Secondary | ICD-10-CM | POA: Diagnosis present

## 2015-12-28 DIAGNOSIS — I951 Orthostatic hypotension: Secondary | ICD-10-CM

## 2015-12-28 DIAGNOSIS — I10 Essential (primary) hypertension: Secondary | ICD-10-CM

## 2015-12-28 LAB — TSH: TSH: 29.309 u[IU]/mL — ABNORMAL HIGH (ref 0.350–4.500)

## 2015-12-28 LAB — LIPID PANEL
CHOLESTEROL: 181 mg/dL (ref 0–200)
HDL: 55 mg/dL (ref >40)
LDL Cholesterol: 105 mg/dL — ABNORMAL HIGH (ref 0–99)
TRIGLYCERIDES: 106 mg/dL (ref <150)
Total CHOL/HDL Ratio: 3.3 RATIO
VLDL: 21 mg/dL (ref 0–40)

## 2015-12-28 LAB — LACTIC ACID, PLASMA
LACTIC ACID, VENOUS: 2.4 mmol/L — AB (ref 0.5–2.0)
Lactic Acid, Venous: 2.4 mmol/L (ref 0.5–2.0)

## 2015-12-28 LAB — MRSA PCR SCREENING: MRSA by PCR: NEGATIVE

## 2015-12-28 LAB — GLUCOSE, CAPILLARY
GLUCOSE-CAPILLARY: 197 mg/dL — AB (ref 65–99)
GLUCOSE-CAPILLARY: 145 mg/dL — AB (ref 65–99)
Glucose-Capillary: 145 mg/dL — ABNORMAL HIGH (ref 65–99)
Glucose-Capillary: 137 mg/dL — ABNORMAL HIGH (ref 65–99)
Glucose-Capillary: 147 mg/dL — ABNORMAL HIGH (ref 65–99)

## 2015-12-28 LAB — ECHOCARDIOGRAM COMPLETE
HEIGHTINCHES: 63 in
Weight: 2249.6 oz

## 2015-12-28 LAB — HEMOGLOBIN A1C: Hgb A1c MFr Bld: 8.2 % — ABNORMAL HIGH (ref 4.0–6.0)

## 2015-12-28 MED ORDER — PIPERACILLIN-TAZOBACTAM 3.375 G IVPB
3.3750 g | Freq: Three times a day (TID) | INTRAVENOUS | Status: DC
  Administered 2015-12-28 – 2016-01-01 (×12): 3.375 g via INTRAVENOUS
  Filled 2015-12-28 (×17): qty 50

## 2015-12-28 MED ORDER — SODIUM CHLORIDE 0.9 % IV BOLUS (SEPSIS)
500.0000 mL | Freq: Once | INTRAVENOUS | Status: AC
  Administered 2015-12-28: 500 mL via INTRAVENOUS

## 2015-12-28 MED ORDER — DOPAMINE-DEXTROSE 3.2-5 MG/ML-% IV SOLN
0.0000 ug/kg/min | INTRAVENOUS | Status: DC
  Administered 2015-12-28: 5 ug/kg/min via INTRAVENOUS
  Filled 2015-12-28 (×2): qty 250

## 2015-12-28 MED ORDER — BISACODYL 10 MG RE SUPP
10.0000 mg | Freq: Once | RECTAL | Status: AC
  Administered 2015-12-28: 10 mg via RECTAL
  Filled 2015-12-28: qty 1

## 2015-12-28 MED ORDER — SODIUM CHLORIDE 0.9 % IV BOLUS (SEPSIS)
1000.0000 mL | Freq: Once | INTRAVENOUS | Status: AC
  Administered 2015-12-28: 1000 mL via INTRAVENOUS

## 2015-12-28 MED ORDER — SODIUM CHLORIDE 0.9 % IV SOLN
INTRAVENOUS | Status: DC
  Administered 2015-12-28 (×3): via INTRAVENOUS

## 2015-12-28 MED ORDER — MORPHINE SULFATE ER 30 MG PO TBCR
60.0000 mg | EXTENDED_RELEASE_TABLET | Freq: Three times a day (TID) | ORAL | Status: DC
  Administered 2015-12-28 – 2016-01-01 (×12): 60 mg via ORAL
  Filled 2015-12-28 (×4): qty 2
  Filled 2015-12-28: qty 4
  Filled 2015-12-28 (×5): qty 2
  Filled 2015-12-28: qty 4
  Filled 2015-12-28: qty 2

## 2015-12-28 NOTE — Progress Notes (Signed)
Call received from Dr Estanislado Pandy to discuss patient condition and response to IV boluses.  New orders given to be initiated.

## 2015-12-28 NOTE — Evaluation (Signed)
Clinical/Bedside Swallow Evaluation Patient Details  Name: NATHANIEL YADEN MRN: [id number] Date of Birth: 26-Oct-1955  Today's Date: 12/28/2015 Time: SLP Start Time (ACUTE ONLY): 0931 SLP Stop Time (ACUTE ONLY): 1031 SLP Time Calculation (min) (ACUTE ONLY): 60 min  Past Medical History:  Past Medical History  Diagnosis Date  . Type 1 diabetes ([place])   . Hypothyroid   . Degenerative disk disease   . Stomach ulcer   . Diverticulitis   . Syncope 01/2015  . Anxiety   . GERD (gastroesophageal reflux disease)   . History of hiatal hernia   . Cancer (HCC)     HX OF CANCER OF UTERUS   . TIA (transient ischemic attack) 02/2015  . Hypertension   . Pancreatitis   . PAF (paroxysmal atrial fibrillation) ([place]) 03/2015    a. new onset 03/2015 in setting of intractable N/V; b. on Eliquis 5 mg bid; c. CHADSVASc 4 (DM, TIA x 2, female)  . Intussusception intestine ([place]) 05/2015  . Stroke ([place])   . Allergy   . Cirrhosis of liver not due to alcohol ([place]) 2016   Past Surgical History:  Past Surgical History  Procedure Laterality Date  . Hernia repair    . Abdominal hysterectomy    . Cholecystectomy    . Esophagogastroduodenoscopy N/A 04/04/2015    Procedure: ESOPHAGOGASTRODUODENOSCOPY (EGD);  Surgeon: Hulen Luster, MD;  Location: [facility] ENDOSCOPY;  Service: Endoscopy;  Laterality: N/A;   HPI:  Pt is a 60 y.o. female with a known history of DM, Stroke, TIA, Anxiety, pancreatitis, GERD, hypothyroidism and other medical dxs. who admitted w/ the complaint of vomiting and dizziness for couple of days. She had abdominal pain and vomiting for 2-3 days and on the morning of admission, she stated that around 3 AM she woke up and felt weak and dizzy and fell to her knees and on Left side. Then she got up the rest of the day, but around 3 this afternoon, she walked to the kitchen and felt lightheaded and then passed out. When she stood back up she felt her left arm and left leg were weak and tingling. She also  reported abdominal pain and loose stool. Her diarrhea has been going on since last Friday.Pt was transferred to CCU yesterday d/t low BP and bradycardia. Pt is alert/oriented x4; verbally conversive and follows instructions appropriately.    Assessment / Plan / Recommendation Clinical Impression  Pt appeared to adequately tolerate trials of thin liquids and solids/purees foods w/ no overt s/s of aspiration noted; no decline in respiratory status and clear vocal quality was noted b/t trials. Oral phase was adequate for bolus control and A-P transfer for swallowing; she took her time masticating the soft solids sec. to edentulous status. Discussed importance of choosing moist, soft foods for easier mastication. Pt fed self w/ min. setup only. She stated she felt at her baseline w/ her swallowing as well as her speech and language communication w/ others; no deficits were noted. Pt appears at reduced risk for aspiration following general aspiration precautions. No further skilled ST services indicated at this time. NSG to reconsult if any decline/change in pt's status while admitted.     Aspiration Risk   (reduced)    Diet Recommendation  Dys. 3(chopped meats, moistened); thin liquids; general aspiration precautions  Medication Administration: Whole meds with liquid    Other  Recommendations Oral Care Recommendations: Oral care BID;Staff/trained caregiver to provide oral care;Patient independent with oral care   Follow up Recommendations  None    Frequency and Duration            Prognosis Prognosis for Safe Diet Advancement: Good      Swallow Study   General Date of Onset: 12/27/15 HPI: Pt is a 60 y.o. female with a known history of DM, Stroke, TIA, Anxiety, pancreatitis, GERD, hypothyroidism and other medical dxs. who admitted w/ the complaint of vomiting and dizziness for couple of days. She had abdominal pain and vomiting for 2-3 days and on the morning of admission, she stated that  around 3 AM she woke up and felt weak and dizzy and fell to her knees and on Left side. Then she got up the rest of the day, but around 3 this afternoon, she walked to the kitchen and felt lightheaded and then passed out. When she stood back up she felt her left arm and left leg were weak and tingling. She also reported abdominal pain and loose stool. Her diarrhea has been going on since last Friday.Pt was transferred to CCU yesterday d/t low BP and bradycardia. Pt is alert/oriented x4; verbally conversive and follows instructions appropriately.  Type of Study: Bedside Swallow Evaluation Previous Swallow Assessment: none Diet Prior to this Study: Regular;Thin liquids Temperature Spikes Noted: No (wbc 3.7) Respiratory Status: Room air History of Recent Intubation: No Behavior/Cognition: Alert;Cooperative;Pleasant mood Oral Cavity Assessment: Within Functional Limits Oral Care Completed by SLP: Recent completion by staff Oral Cavity - Dentition: Edentulous Vision: Functional for self-feeding Self-Feeding Abilities: Able to feed self;Needs set up Patient Positioning: Upright in bed Baseline Vocal Quality: Normal Volitional Cough: Strong Volitional Swallow: Able to elicit    Oral/Motor/Sensory Function Overall Oral Motor/Sensory Function: Within functional limits   Ice Chips Ice chips: Within functional limits Presentation: Spoon (fed; 2 trials)   Thin Liquid Thin Liquid: Within functional limits Presentation: Self Fed;Straw;Cup (10 trials total)    Nectar Thick Nectar Thick Liquid: Not tested   Honey Thick Honey Thick Liquid: Not tested   Puree Puree: Within functional limits Presentation: Spoon;Self Fed (4 trials)   Solid   GO   Solid: Within functional limits Presentation: Self Fed (5 trials)       Orinda Kenner, MS, CCC-SLP  Deeandra Jerry 12/28/2015,1:49 PM

## 2015-12-28 NOTE — Evaluation (Signed)
Physical Therapy Evaluation Patient Details Name: [name] MRN: [id number] DOB: [birth year] Today's Date: 12/28/2015   History of Present Illness  60 yo F presented to ED with abdominal pain with N/V/D for 4 days. She also reported having acute L sided weakness. She possibly had an acute stroke. She is currently on enteric precautions for possible c-diff. PMH includes DM I, cancer, TIA, PAF, and cirrhosis of the liver not due to alcohol.  Clinical Impression  Pt demonstrated generalized weakness L>R and difficulty walking. She requires min A for bed mobility and STS. Standing tolerance limited to ~20 seconds and was unable to attempt ambulation at this time. At baseline her husband provides HHA for mobility and ADLs. Due to decreased mobility and strength STR is recommended to progress towards PLOF. Pt will benefit from skilled PT services to increase functional I and mobility for safe discharge.     Follow Up Recommendations SNF    Equipment Recommendations  None recommended by PT    Recommendations for Other Services       Precautions / Restrictions Precautions Precautions: Fall Restrictions Weight Bearing Restrictions: No      Mobility  Bed Mobility Overal bed mobility: Needs Assistance Bed Mobility: Supine to Sit;Sit to Supine     Supine to sit: Min assist;HOB elevated Sit to supine: Min assist;HOB elevated      Transfers Overall transfer level: Needs assistance Equipment used: Rolling walker (2 wheeled) Transfers: Sit to/from Stand Sit to Stand: Min assist         General transfer comment: cues for hand placement, postural correction  Ambulation/Gait             General Gait Details: unable at this time due to poor standing tolerance  Stairs            Wheelchair Mobility    Modified Rankin (Stroke Patients Only)       Balance Overall balance assessment: Needs assistance;History of Falls Sitting-balance support: Bilateral upper  extremity supported;Feet supported Sitting balance-Leahy Scale: Good     Standing balance support: Bilateral upper extremity supported Standing balance-Leahy Scale: Poor Standing balance comment: knees buckling, tolerates standing up to ~20 sec                             Pertinent Vitals/Pain Pain Assessment: 0-10 Pain Score: 10-Worst pain ever Pain Location: abdominal Pain Descriptors / Indicators: Aching Pain Intervention(s): Limited activity within patient's tolerance;Monitored during session    Home Living Family/patient expects to be discharged to:: Private residence Living Arrangements: Spouse/significant other Available Help at Discharge: Family;Available 24 hours/day Type of Home: Mobile home Home Access: Ramped entrance     Home Layout: One level Home Equipment: [place] - 2 wheels;Cane - single point;Bedside commode Additional Comments: walker cannot fit down hallway of mobile home    Prior Function Level of Independence: Needs assistance   Gait / Transfers Assistance Needed: HHA by husband from bedroom to chair in living room.   ADL's / Homemaking Assistance Needed: Reports assist provided with meals, bathing, dressing, transfers and bed mobility  Comments: Reports that the sometimes uses a FWW or has HHA by husband.     Hand Dominance   Dominant Hand: Right    Extremity/Trunk Assessment   Upper Extremity Assessment: Generalized weakness;LUE deficits/detail           Lower Extremity Assessment: Generalized weakness;LLE deficits/detail         Communication  Communication: No difficulties  Cognition Arousal/Alertness: Lethargic Behavior During Therapy: WFL for tasks assessed/performed Overall Cognitive Status: Within Functional Limits for tasks assessed                      General Comments      Exercises Other Exercises Other Exercises: B LE supine therex: ankle pumps, QS, GS, heel slides, hip abd slides x10 each.Cues  for technique. Pt fatigues quickly and requires therapeutic rest breaks for energy conservation.      Assessment/Plan    PT Assessment Patient needs continued PT services  PT Diagnosis Difficulty walking;Generalized weakness   PT Problem List Decreased strength;Decreased activity tolerance;Decreased balance;Decreased mobility;Decreased coordination  PT Treatment Interventions Gait training;Therapeutic activities;Therapeutic exercise;Balance training;Neuromuscular re-education;Patient/family education   PT Goals (Current goals can be found in the Care Plan section) Acute Rehab PT Goals Patient Stated Goal: to feel better PT Goal Formulation: With patient Time For Goal Achievement: 01/11/16 Potential to Achieve Goals: Fair    Frequency Min 2X/week   Barriers to discharge Decreased caregiver support pt unable to walk at this time    Co-evaluation               End of Session Equipment Utilized During Treatment: Gait belt Activity Tolerance: Patient limited by fatigue;Patient limited by pain Patient left: in bed;with call bell/phone within reach Nurse Communication: Mobility status         Time: 1030-1053 PT Time Calculation (min) (ACUTE ONLY): 23 min   Charges:   PT Evaluation $PT Eval High Complexity: 1 Procedure PT Treatments $Therapeutic Exercise: 8-22 mins   PT G Codes:        Neoma Laming, PT, DPT  12/28/2015, 11:07 AM [phone]

## 2015-12-28 NOTE — Evaluation (Signed)
Occupational Therapy Evaluation Patient Details Name: [name] MRN: [id number] DOB: [birth year] Today's Date: 12/28/2015    History of Present Illness 60 yo F presented to ED with abdominal pain with N/V/D for 4 days. She also reported having acute L sided weakness with hx of CVAs and being worked up for possible new CVA.Marland Kitchen She is currently on enteric precautions for possible c-diff. PMH includes DM I, cancer, TIA, PAF, and cirrhosis of the liver not due to alcohol.   Clinical Impression   Pt is 60 year old female who presents with N/V/D for 4 days and acute L sided weakness with hx of previous R CVA.  She is enteric precautions for possible c-diff.  She has weakness in LUE and hand with active movement in wrist, hand, elbow and up to 80 degrees of shoulder flexion but no IR/ER.  She is R hand dominant and would benefit from instruction in adaptive equipment such as rocker knife and dycem for feeding skills, neuromuscular re-ed for LUE and hand with strengthening with theraputty for L hand, and instruction in ADLs sitting up in chair when BP is stable and higher and dizziness improves.  Rec a 3 in commode seat over toilet for bilateral grab bars to prevent falls at home and a non-slip mat outside of tub to also prevent falls.  Pt given adaptive equipment catalog to review for further aids for ADLs and will review in next session.  Assessment limited to bed level due to low BP and pt not able to reach feet due to this and requires max assist for LB dressing and bathing, but minimal assist for feeding and grooming skills.  Pt would benefit from skilled OT services to address ADL training, fine motor skills training, adaptive equipment training, strengthening, and family ed and training.  Pt would benefit from SNF for continued rehab after discharge from hospital.    Follow Up Recommendations  SNF    Equipment Recommendations  3 in 1 bedside comode    Recommendations for Other Services        Precautions / Restrictions Precautions Precautions: Fall Restrictions Weight Bearing Restrictions: No      Mobility Bed Mobility                  Transfers                      Balance                                            ADL Overall ADL's : Needs assistance/impaired Eating/Feeding: Minimal assistance;Set up   Grooming: Bed level;Minimal assistance               Lower Body Dressing: Maximal assistance;Bed level                 General ADL Comments: Pt is limited in ADLs due to low BP and unable to tolerate standing more than 20 sec with PT, ADLs limited to bed level until BP increases and dizziness decreases, pain is high 7-8/10 in abdomen and NSG indicated she is not on pain medication regime like she was at home but was started back on some today during session.     Vision     Perception     Praxis      Pertinent Vitals/Pain Pain Assessment: 0-10 Pain Score:  8  Pain Location: abdominal Pain Descriptors / Indicators: Aching Pain Intervention(s): Limited activity within patient's tolerance;Monitored during session;RN gave pain meds during session     Hand Dominance Right   Extremity/Trunk Assessment Upper Extremity Assessment Upper Extremity Assessment: LUE deficits/detail LUE Deficits / Details: active movement in L hand, wrist and elbow but limited shoulder movement to 80 degrees actively and 100 degrees passively  LUE Sensation: decreased proprioception LUE Coordination: decreased fine motor;decreased gross motor   Lower Extremity Assessment Lower Extremity Assessment: Defer to PT evaluation       Communication Communication Communication: No difficulties   Cognition Arousal/Alertness: Awake/alert Behavior During Therapy: WFL for tasks assessed/performed Overall Cognitive Status: Within Functional Limits for tasks assessed                     General Comments       Exercises        Shoulder Instructions      Home Living Family/patient expects to be discharged to:: Private residence Living Arrangements: Spouse/significant other Available Help at Discharge: Family;Available 24 hours/day Type of Home: Mobile home Home Access: Ramped entrance     Home Layout: One level     Bathroom Shower/Tub: Tub/shower unit Shower/tub characteristics: Curtain Biochemist, clinical: Standard Bathroom Accessibility: Yes   Home Equipment: Environmental consultant - 2 wheels;Cane - single point;Bedside commode   Additional Comments: walker cannot fit down hallway of mobile home      Prior Functioning/Environment Level of Independence: Needs assistance  Gait / Transfers Assistance Needed: HHA by husband from bedroom to chair in living room.  ADL's / Homemaking Assistance Needed: Reports assist provided with meals, bathing, dressing, transfers and bed mobility Communication / Swallowing Assistance Needed: stuttering  Comments: Reports that the sometimes uses a FWW or has HHA by husband.    OT Diagnosis: Generalized weakness;Hemiplegia non-dominant side   OT Problem List: Decreased strength;Decreased range of motion;Decreased activity tolerance;Impaired balance (sitting and/or standing);Impaired UE functional use;Decreased knowledge of use of DME or AE   OT Treatment/Interventions: Self-care/ADL training;Therapeutic exercise;Neuromuscular education;Therapeutic activities;Patient/family education    OT Goals(Current goals can be found in the care plan section) Acute Rehab OT Goals Patient Stated Goal: "to get some food and get home soon" OT Goal Formulation: With patient Time For Goal Achievement: 01/11/16 Potential to Achieve Goals: Good ADL Goals Pt Will Perform Eating: Independently;with adaptive utensils (rec rocker knife and dycem) Pt Will Perform Lower Body Dressing: with min assist;sit to/from stand Pt Will Transfer to Toilet: with min assist;regular height toilet;bedside commode;stand  pivot transfer (BSC over toilet ) Pt/caregiver will Perform Home Exercise Program: Increased strength;Left upper extremity;With theraputty;With written HEP provided;Independently  OT Frequency: Min 1X/week   Barriers to D/C:            Co-evaluation              End of Session Nurse Communication:  (to clear for eval since BP was low)  Activity Tolerance: Patient limited by fatigue Patient left: in bed;with bed alarm set;with call bell/phone within reach   Time: 1420-1450 OT Time Calculation (min): 30 min Charges:  OT General Charges $OT Visit: 1 Procedure OT Evaluation $OT Eval Moderate Complexity: 1 Procedure OT Treatments $Self Care/Home Management : 8-22 mins G-Codes:    Chrys Racer, OTR/L ascom [phone] 12/28/2015, 3:13 PM

## 2015-12-28 NOTE — Progress Notes (Signed)
Noted Consult for diabetes coordinator; chart reviewed. Patient is currently ordered: Levemir 17 units QHS, Novolog 0-9 units TID with meals, Novolog 0-5 units QHS, Novolog 3 units TID with meals for meal coverage. According to the chart, patient is taking Levemir 17 units QHS and Novolog TID with meals when needed (no dose or ranged noted in home medication list). Diabetes coordinator spoke with patient at length on 12/19/15 during previous hospitalization. Will continue to follow along and make further recommendations if needed as more data is collected.  Thanks, Barnie Alderman, RN, MSN, CDE Diabetes Coordinator Inpatient Diabetes Program [phone] (Team Pager from [place] to [place]) [phone] ([facility] office) [phone] [facility] office) [phone] [facility] office)

## 2015-12-28 NOTE — Progress Notes (Signed)
Patient is transferred to ICU room 16. Report given to [facility]. Called to update husband Mr. Penagos x 2  but could no leave a message ( voice mail not set up ). Will try to call him again later.

## 2015-12-28 NOTE — Progress Notes (Signed)
Made aware of hypotension by assigned RN Baldo Ash. Call placed to Nursing Supervisor to update her on pt condition and response to ordered IV boluses.

## 2015-12-28 NOTE — Progress Notes (Signed)
PT Cancellation Note  Patient Details Name: [name] MRN: [id number] DOB: [birth year]   Cancelled Treatment:    Reason Eval/Treat Not Completed: Patient at procedure or test/unavailable;Other (comment). Pt's chart reviewed. Upon PT's arrival pt was being prepared for an x-ray procedure and unavailable to participate in PT at the time. PT will f/u at a later time and complete evaluation when appropriate.   Neoma Laming, PT, DPT  12/28/2015, 9:30 AM [phone]

## 2015-12-28 NOTE — Progress Notes (Signed)
BP=49/27,  manual BP checked was 49/30, Dr. Jannifer Franklin notified with a new order for NS 500 cc bolus x 1 dose. Patient is alert and oriented x 4. Patient  stated she felt funny but cannot describe the symptoms in words.  BP after the bolus was  64/38. Dr. Jannifer Franklin notified again with a new order for another NS 500 mL. NS bolus in progress. Will recheck BP after the bolus.

## 2015-12-28 NOTE — Progress Notes (Signed)
Called by RN taking care of the patient, that blood pressure is low and patient has bradycardia. Patient received fluid boluses for low blood pressure.Blood pressure still low,will move patient to icu for close monitoring and iv pressor medication for blood pressure support.

## 2015-12-28 NOTE — Progress Notes (Signed)
Patient had a pause of 0.24 seconds and her HR dropped to 35. DR. Estanislado Pandy notified. BP=73/43 right now. Dr. Estanislado Pandy notified with a new order to transfer the patient to ICU. Patient is asymptomatic, talkative, alert and oriented x 4

## 2015-12-28 NOTE — Consult Note (Signed)
Cardiology Consultation Note  Patient ID: [name], MRN: [id number], DOB/AGE: June 23, 1956 60 y.o. Admit date: 12/27/2015   Date of Consult: 12/28/2015 Primary Physician: Maryland Pink, MD Primary Cardiologist: New to [facility] - has previously been consulted on by [facility] at [facility], never followed up as an outpatient   Chief Complaint: LOC, fatigue, N/V/D Reason for Consult: Syncope  HPI: 60 y.o. female with h/o PAF on Eliquis, tachy-brady syndrome, DM1, hypothyroidism, syncope, anxiety, GERD, TIA/CVA with residual left-sided weakness, HTN, and uterine cancer who presented to [facility] with recurrence of N/V/D and s/p suffering 2 syncopal episodes at home.   She was recently admitted to [facility] in January 2017 with dizziness, presyncope, and bradycardia. At that time she was taking metoprolol 25 mg bid and amiodarone 200 mg daily. She had previously been advised by EP to discontinue amiodarone 2/2 bradycardia which was felt to be leading to her falls and hypotension, though apparently this was restarted at sometime between September and January without seeing cardiology as an outpatient. QTc was noted to be prolonged while on amiodarone at 550 ms. During that admission echo showed EF 60-65%, normal wall motion, GR1DD. Carotid dopplers showed bilateral 1-39% stenosis. She was seen by EP given her tachy-brady syndrome and felt to not be a candidate for PPM. Given her bradycardic rates her amiodarone was not resumed and she was discharged on metoprolol. She has had several hospital readmissions since then for severe abdominal pain, nausea, and vomiting of uncertain etiology. SHe has yet to follow up with GI as an outpatient.   She presented to [facility] on 4/18 after again developing nausea, vomiting, and diarrhea. This was followed by a syncopal episode in her kitchen that was unwitnessed. She is u ncertain how long she was out. She got up and went to her couch to lay down. Upon sitting up and standing up she again  suffered another syncopal episode, this time witnessed by her husband for approximately 1 minute. She reports she can usually feel it when she is about to suffer a syncopal episode. Upon the patient's arrival to [facility] they were found to have sepsis in the setting of colitis. While ambulating with PT on 4/18, apparently her heart rate dropped to the 30's. There is no documentation of this on telemetry as heart rates on telemetry show a low heart rate of 59 bpm. She did have some PACs with sinus node resetting. ECG as below. She has been hypotensive requiring dopamine that has been difficult to wean. Heart rate is currently in the 80 bpm range. TSH is pending. Albumin of 2.9. Troponin of <0.03 x 2-->0.04 in the setting of her hypotension.    Past Medical History  Diagnosis Date  . Type 1 diabetes ([place])   . Hypothyroid   . Degenerative disk disease   . Stomach ulcer   . Diverticulitis   . Syncope 01/2015  . Anxiety   . GERD (gastroesophageal reflux disease)   . History of hiatal hernia   . Cancer (HCC)     HX OF CANCER OF UTERUS   . TIA (transient ischemic attack) 02/2015  . Hypertension   . Pancreatitis   . PAF (paroxysmal atrial fibrillation) ([place]) 03/2015    a. new onset 03/2015 in setting of intractable N/V; b. on Eliquis 5 mg bid; c. CHADSVASc 4 (DM, TIA x 2, female)  . Intussusception intestine ([place]) 05/2015  . Stroke ([place])   . Allergy   . Cirrhosis of liver not due to alcohol ([place])  2016      Most Recent Cardiac Studies: Echo 10/05/2015: Study Conclusions - Left ventricle: The cavity size was normal. Wall thickness was  increased in a pattern of moderate LVH. Systolic function was  normal. The estimated ejection fraction was in the range of 60%  to 65%. Wall motion was normal; there were no regional wall  motion abnormalities. Doppler parameters are consistent with  abnormal left ventricular relaxation (grade 1 diastolic  dysfunction).   Surgical History:  Past Surgical  History  Procedure Laterality Date  . Hernia repair    . Abdominal hysterectomy    . Cholecystectomy    . Esophagogastroduodenoscopy N/A 04/04/2015    Procedure: ESOPHAGOGASTRODUODENOSCOPY (EGD);  Surgeon: Hulen Luster, MD;  Location: [facility] END[facility];  Service: Endoscopy;  Laterality: N/A;     Home Meds: Prior to Admission medications   Medication Sig Start Date End Date Taking? Authorizing Provider  albuterol (PROVENTIL HFA;VENTOLIN HFA) 108 (90 Base) MCG/ACT inhaler Inhale 2 puffs into the lungs every 6 (six) hours as needed for wheezing or shortness of breath. 12/19/15  Yes Gladstone Lighter, MD  amLODipine (NORVASC) 5 MG tablet Take 5 mg by mouth daily.   Yes Historical Provider, MD  apixaban (ELIQUIS) 5 MG TABS tablet Take 1 tablet (5 mg total) by mouth 2 (two) times daily. 04/07/15  Yes Bettey Costa, MD  benzonatate (TESSALON) 200 MG capsule Take 1 capsule (200 mg total) by mouth 3 (three) times daily as needed for cough. 12/19/15  Yes Gladstone Lighter, MD  budesonide-formoterol (SYMBICORT) 160-4.5 MCG/ACT inhaler Inhale 2 puffs into the lungs 2 (two) times daily. 12/19/15  Yes Gladstone Lighter, MD  citalopram (CELEXA) 20 MG tablet Take 20 mg by mouth daily.   Yes Historical Provider, MD  dicyclomine (BENTYL) 10 MG capsule Take 10 mg by mouth 4 (four) times daily - after meals and at bedtime.    Yes Historical Provider, MD  insulin aspart (NOVOLOG) 100 UNIT/ML injection Inject into the skin 3 (three) times daily with meals as needed for high blood sugar. Pt uses as needed per sliding scale.   Yes Historical Provider, MD  insulin detemir (LEVEMIR) 100 UNIT/ML injection Inject 17 Units into the skin at bedtime.   Yes Historical Provider, MD  levothyroxine (SYNTHROID, LEVOTHROID) 75 MCG tablet Take 1 tablet (75 mcg total) by mouth daily before breakfast. 10/08/15  Yes Loleta Chance, MD  lisinopril (PRINIVIL,ZESTRIL) 5 MG tablet Take 5 mg by mouth daily.   Yes Historical Provider, MD  magnesium oxide  (MAG-OX) 400 (241.3 Mg) MG tablet Take 1 tablet (400 mg total) by mouth 2 (two) times daily. 11/16/15  Yes Belkys A Regalado, MD  metoCLOPramide (REGLAN) 5 MG tablet Take 1 tablet (5 mg total) by mouth 4 (four) times daily -  before meals and at bedtime. 12/19/15  Yes Gladstone Lighter, MD  metoprolol tartrate (LOPRESSOR) 25 MG tablet Take 25 mg by mouth 2 (two) times daily.    Yes Historical Provider, MD  morphine (MS CONTIN) 15 MG 12 hr tablet Take 1 tablet (15 mg total) by mouth every 8 (eight) hours. 10/21/15  Yes Lance Bosch, MD  oxyCODONE (OXY IR/ROXICODONE) 5 MG immediate release tablet Take 5-10 mg by mouth every 6 (six) hours as needed for severe pain.   Yes Historical Provider, MD  pantoprazole (PROTONIX) 40 MG tablet Take 40 mg by mouth 2 (two) times daily before a meal.   Yes Historical Provider, MD  predniSONE (DELTASONE) 10 MG tablet Take 10-50 mg  by mouth daily with breakfast. Pt is to take as a taper:  5 tablets for three days, 4 tablets for three days, 3 tablets for three days, 2 tablets for three days, 1 tablet for three days, then stop. 12/19/15 01/03/16 Yes Historical Provider, MD  rifaximin (XIFAXAN) 550 MG TABS tablet Take 1 tablet (550 mg total) by mouth 2 (two) times daily. 07/11/15  Yes Theodoro Grist, MD  tiZANidine (ZANAFLEX) 4 MG tablet Take 4 mg by mouth 3 (three) times daily as needed for muscle spasms.    Yes Historical Provider, MD  metroNIDAZOLE (FLAGYL) 500 MG tablet Take 1 tablet (500 mg total) by mouth 3 (three) times daily. X 7 more days Patient not taking: Reported on 12/27/2015 12/19/15   Gladstone Lighter, MD    Inpatient Medications:  . apixaban  5 mg Oral BID  . aspirin  300 mg Rectal Daily   Or  . aspirin  325 mg Oral Daily  . atorvastatin  40 mg Oral q1800  . bisacodyl  10 mg Rectal Once  . citalopram  20 mg Oral Daily  . dicyclomine  10 mg Oral TID PC & HS  . insulin aspart  0-5 Units Subcutaneous QHS  . insulin aspart  0-9 Units Subcutaneous TID WC  .  insulin aspart  3 Units Subcutaneous TID WC  . insulin detemir  17 Units Subcutaneous QHS  . levothyroxine  75 mcg Oral QAC breakfast  . magnesium oxide  400 mg Oral BID  . metoCLOPramide  5 mg Oral TID AC & HS  . mometasone-formoterol  2 puff Inhalation BID  . pantoprazole  40 mg Oral BID AC  . piperacillin-tazobactam (ZOSYN)  IV  3.375 g Intravenous Q8H  . rifaximin  550 mg Oral BID  . sodium chloride  1,000 mL Intravenous Once   . sodium chloride 125 mL/hr at 12/28/15 0410  . DOPamine 15 mcg/kg/min (12/28/15 5366)    Allergies:  Allergies  Allergen Reactions  . Hydrocodone Other (See Comments)    Pt states that this medication caused cirrhosis of the liver.    . Erythromycin Other (See Comments)    Reaction:  Fever   . Prednisone Other (See Comments)    Reaction:  Unknown   . Rosiglitazone Maleate Swelling  . Codeine Sulfate Rash  . Tetanus-Diphtheria Toxoids Td Rash and Other (See Comments)    Reaction:  Fever     Social History   Social History  . Marital Status: Married    Spouse Name: N/A  . Number of Children: N/A  . Years of Education: N/A   Occupational History  . Disabled 2nd back problems    Social History Main Topics  . Smoking status: Never Smoker   . Smokeless tobacco: Never Used  . Alcohol Use: No  . Drug Use: No  . Sexual Activity: No   Other Topics Concern  . Not on file   Social History Narrative   Lives in [place], [place] with her husband and 2 sons.     Family History  Problem Relation Age of Onset  . Hypertension Mother   . CAD Sister   . Heart attack Sister     Deceased 11-21-14  . CAD Brother      Review of Systems: Review of Systems  Constitutional: Positive for weight loss and malaise/fatigue. Negative for fever, chills and diaphoresis.  HENT: Negative for congestion.   Eyes: Negative for discharge and redness.  Respiratory: Positive for shortness of breath. Negative for  cough, hemoptysis, sputum production and wheezing.     Cardiovascular: Positive for palpitations. Negative for chest pain, orthopnea, claudication, leg swelling and PND.  Gastrointestinal: Positive for nausea, abdominal pain and diarrhea. Negative for heartburn, vomiting, constipation and melena.  Musculoskeletal: Positive for myalgias and falls.  Skin: Negative for rash.  Neurological: Positive for dizziness, loss of consciousness and weakness. Negative for tingling, tremors, sensory change, speech change, focal weakness and seizures.  Endo/Heme/Allergies: Does not bruise/bleed easily.  Psychiatric/Behavioral: Negative for substance abuse. The patient is not nervous/anxious.   All other systems reviewed and are negative.   Labs:  Recent Labs  12/27/15 1804  TROPONINI 0.04*   Lab Results  Component Value Date   WBC 3.7 12/27/2015   HGB 10.7* 12/27/2015   HCT 32.6* 12/27/2015   MCV 90.1 12/27/2015   PLT 152 12/27/2015    Recent Labs Lab 12/27/15 1804  NA 137  K 4.0  CL 108  CO2 24  BUN 9  CREATININE 0.62  CALCIUM 8.2*  PROT 5.7*  BILITOT 1.3*  ALKPHOS 82  ALT 20  AST 39  GLUCOSE 233*   Lab Results  Component Value Date   CHOL 181 12/28/2015   HDL 55 12/28/2015   LDLCALC 105* 12/28/2015   TRIG 106 12/28/2015   Lab Results  Component Value Date   DDIMER  10/26/2007    0.24        AT THE INHOUSE ESTABLISHED CUTOFF VALUE OF 0.48 ug/mL FEU, THIS ASSAY HAS BEEN DOCUMENTED IN THE LITERATURE TO HAVE    Radiology/Studies:  Dg Chest 2 View  12/27/2015  CLINICAL DATA:  Weakness.  Cough. EXAM: CHEST  2 VIEW COMPARISON:  Chest from abdominal radiographs earlier this day at 1744 hour FINDINGS: Unchanged elevation of right hemidiaphragm. There is adjacent atelectasis or scarring at the lung bases. Cardiomediastinal contours are unchanged with stable cardiomegaly. Probable vascular congestion. No evidence pulmonary edema. Trace left pleural effusion IMPRESSION: Unchanged elevation of right hemidiaphragm. Unchanged  cardiomegaly. No new abnormality is seen. Electronically Signed   By: Jeb Levering M.D.   On: 12/27/2015 23:50   Dg Chest 2 View  12/17/2015  CLINICAL DATA:  Recent onset of cough; admitted yesterday; recently hospitalized for pneumonia; hx/o cirrhosis, htn, paf, and cancer of uterus EXAM: CHEST  2 VIEW COMPARISON:  12/16/2015 FINDINGS: Since the previous day's study, areas of airspace opacity have developed, most evident in the right upper lobe, but also noted in the right lower lobe and left lower lobe. No pleural effusion or pneumothorax. Cardiac silhouette is normal in size and configuration. No mediastinal or hilar masses or convincing adenopathy. Bony thorax is intact. IMPRESSION: 1. Multifocal pneumonia has become apparent since the previous day's study. Electronically Signed   By: Lajean Manes M.D.   On: 12/17/2015 12:23   Ct Head Wo Contrast  12/27/2015  CLINICAL DATA:  Abdominal pain for 4 days, nausea, vomiting, diarrhea, onset of LEFT-sided weakness at 1430 hours, history TIA, atrial fibrillation, diabetes mellitus, paroxysmal atrial fibrillation, essential hypertension, uterine cancer EXAM: CT HEAD WITHOUT CONTRAST TECHNIQUE: Contiguous axial images were obtained from the base of the skull through the vertex without intravenous contrast. COMPARISON:  11/11/2015 FINDINGS: Mild atrophy. Normal ventricular morphology. No midline shift or mass effect. Otherwise normal appearance of brain parenchyma. No intracranial hemorrhage, mass lesion, or evidence acute infarction. No extra-axial fluid collections. Visualized paranasal sinuses and mastoid air cells clear. Mild hyperostosis frontalis interna. No acute osseous findings. IMPRESSION: No acute intracranial abnormalities. Findings  called to Dr. Reita Cliche on 12/27/2015 at 1703 hours. Electronically Signed   By: Lavonia Dana M.D.   On: 12/27/2015 17:04   Ct Chest Wo Contrast  12/17/2015  CLINICAL DATA:  Diagnosed with pneumonia 1 month ago. Coughing and  wheezing has increased over the last couple days. Shortness of breath today. EXAM: CT CHEST WITHOUT CONTRAST TECHNIQUE: Multidetector CT imaging of the chest was performed following the standard protocol without IV contrast. COMPARISON:  Chest x-rays dated 12/17/2015 and 11/14/2015. FINDINGS: Mediastinum/Lymph Nodes: Heart size is upper normal. No pericardial effusion. Scattered coronary artery calcifications noted. No masses or enlarged lymph nodes seen within the mediastinum or perihilar regions. Lungs/Pleura: Patchy consolidations throughout both lungs, involving all lobes, most dense within the right upper lobe and at the left lung base posteriorly. Majority of the consolidations are associated with ground-glass opacities suggesting atypical pneumonia or associated edema. Trachea and central bronchi are unremarkable. Upper abdomen: Free ascites is seen within the upper abdomen. Liver appears cirrhotic. Patient is status post cholecystectomy. Thickening of the walls of the upper right colon appear similar to the appearance on CT abdomen of 12/16/2015 compatible with the description of colitis on the associated report. Musculoskeletal: Mild scoliosis of the thoracic spine. No evidence of acute osseous abnormality. Superficial soft tissues are unremarkable. IMPRESSION: 1. Multi lobar consolidations, most dense within the right upper lobe and at the left lung base posteriorly, with areas of associated ground-glass opacity suggesting atypical pneumonia and/or edema. Differential includes atypical pneumonias such as viral or fungal, interstitial pneumonias, edema related to volume overload/CHF, chronic interstitial diseases, hypersensitivity pneumonitis, and respiratory bronchiolitis. 2. Upper abdominal ascites, presumably associated with liver cirrhosis. Thickening of the walls of the upper ascending colon similar to its appearance on CT abdomen of 12/16/2015, compatible with the previous description of colitis on  the earlier CT report. Electronically Signed   By: Franki Cabot M.D.   On: 12/17/2015 17:13   Ct Abdomen Pelvis W Contrast  12/27/2015  CLINICAL DATA:  Vomiting and dizziness. Abdominal pain. Weakness. Lightheadedness. Cirrhosis. EXAM: CT ABDOMEN AND PELVIS WITH CONTRAST TECHNIQUE: Multidetector CT imaging of the abdomen and pelvis was performed using the standard protocol following bolus administration of intravenous contrast. CONTRAST:  154m ISOVUE-300 IOPAMIDOL (ISOVUE-300) INJECTION 61% COMPARISON:  Multiple exams, including 12/16/2015 CT scan FINDINGS: Lower chest: Mild interstitial accentuation in both lower lobes, similar to 12/16/2015. Cardiomegaly with left ventricular hypertrophy. Trace left pleural effusion. Trace pericardial effusion. Small uphill varices adjacent to the distal esophagus. Hepatobiliary: Advanced cirrhosis. No focal liver mass seen. Cholecystectomy. Pancreas: Unremarkable Spleen: Unremarkable Adrenals/Urinary Tract: Adrenal glands normal. Right kidney lower pole 9 mm calculus, not appreciably changed. Small hypodense lesions of the right kidney are statistically likely to be cysts but technically too small to characterize. No hydronephrosis, ureteral calculus, or bladder calculus seen. Stomach/Bowel: Wall thickening of the stomach along the gastric cardia, somewhat resembling a fundoplication, correlate with operative history. Several mildly dilated loops of jejunum in the left abdomen, without a transition point to suggest obstruction. Equivocal wall thickening in the ascending colon. Vascular/Lymphatic: Aortoiliac atherosclerotic vascular disease. Gastric varices. Splenorenal shunting. Portal vein and splenic vein patent. No pathologic adenopathy identified. Reproductive: Uterus absent.  Ovaries not well seen. Other: Considerable ascites, increased from the exam 11 days ago. Increased mesenteric and subcutaneous edema. Musculoskeletal: Healing fractures the right anterior fourth,  fifth, sixth, seventh, and eighth ribs with associated rib sclerosis, not appreciably changed from 12/16/2015. IMPRESSION: 1. Compared to the exam from 11 days  ago, there is worsening third spacing of fluid with diffuse mesenteric and subcutaneous edema along with increase in ascites. 2. Cirrhosis. 3. Trace left pleural effusion and trace pericardial effusion. 4. Healing anterior fourth through eighth rib fractures on the right. 5. Cardiomegaly with left ventricular hypertrophy. 6. The uphill esophageal varices and gastric varices. Splenorenal shunting. 7. Nonobstructive right kidney lower pole 9 mm calculus. 8. Wall thickening along the gastric cardia, query prior fundoplication. 9. Equivocal wall thickening in the ascending colon, possibly from third spacing of fluid or mild proximal colitis. 10.  Aortoiliac atherosclerotic vascular disease. Electronically Signed   By: Van Clines M.D.   On: 12/27/2015 21:19   Ct Abdomen Pelvis W Contrast  12/16/2015  CLINICAL DATA:  Abdominal pain, nausea, vomiting and diarrhea. EXAM: CT ABDOMEN AND PELVIS WITH CONTRAST TECHNIQUE: Multidetector CT imaging of the abdomen and pelvis was performed using the standard protocol following bolus administration of intravenous contrast. CONTRAST:  133m ISOVUE-300 IOPAMIDOL (ISOVUE-300) INJECTION 61% COMPARISON:  Abdominal series today as well as prior CT of the abdomen and pelvis on 11/14/2015. FINDINGS: Lower chest: The visualized lung bases show bibasilar atelectasis. No pleural fluid identified. Hepatobiliary: There again is evidence of cirrhosis with grossly nodular appearance of the liver. There is associated ascites surrounding the liver and also elsewhere in the peritoneal cavity. Overall volume of ascites is small to moderate. There is evidence of portal hypertension with distal esophageal and gastroesophageal varices identified supplied by the left gastric vein as well as probable supply from short gastric veins and a  gastro-renal shunt emanating from the left renal vein. Pancreas: No mass, inflammatory changes, or other significant abnormality. Spleen: Within normal limits in size and appearance. Adrenals/Urinary Tract: No evidence of hydronephrosis. Nonobstructing calculus in the lower pole of the right kidney present measuring approximately 10 mm in greatest diameter. Stomach/Bowel: There is evidence of new colitis with significant circumferential thickening involving the ascending and transverse colon. Colonic thickening appears extends just into the proximal descending colon. No evidence of bowel perforation or focal abscess. No small bowel dilatation or obstruction. The stomach continues to show evidence of prominent folds at the level of the gastric cardia and GE junction extending into the body of the stomach. Prominence at the level of the gastric cardia is more pronounced compared to prior studies and it may help to correlate this finding with repeat EGD. Vascular/Lymphatic: No pathologically enlarged lymph nodes. No evidence of abdominal aortic aneurysm. Reproductive: The uterus has been removed. Other: No hernias identified. Musculoskeletal: Bony structures show stable spondylosis at L5-S1 a mild leftward convex scoliosis of the lumbar spine. IMPRESSION: 1. New colitis involving the ascending and transverse colon. No evidence of bowel perforation, focal abscess or small bowel obstruction. 2. Cirrhosis and portal hypertension with distal esophageal and gastroesophageal varices identified supplied by probable left gastric and gastro-renal shunts. 3. Some increased prominence of folds at the level of the gastric cardia extending into the body of the stomach. Some of these changes were apparently secondary to prior fundoplication and gastric inflammation by prior EGD. Correlation with repeat EGD may be helpful, especially given the CT evidence of definite distal esophageal varices. Electronically Signed   By: GAletta EdouardM.D.   On: 12/16/2015 15:48   Dg Abd Acute W/chest  12/27/2015  CLINICAL DATA:  Upper abdominal pain for the past 4 days. History of atrial fibrillation. EXAM: DG ABDOMEN ACUTE W/ 1V CHEST COMPARISON:  12/17/2015; 12/16/2015 FINDINGS: Grossly unchanged enlarged cardiac silhouette and mediastinal  contours given persistently reduced lung volumes. Minimal bilateral infrahilar opacities favored to represent atelectasis. Mild pulmonary venous congestion without frank evidence of edema. No pleural effusion or pneumothorax. Mild gas distention of multiple loops of small bowel without definitive evidence of enteric obstruction. Unremarkable colonic stool burden. No pneumoperitoneum, pneumatosis or portal venous gas. Indeterminate punctate (approximately 5 mm) opacity overlying the right mid abdomen. Several phleboliths overlie the lower pelvis bilaterally. Mild to moderate scoliotic curvature of the thoracolumbar spine with dominant caudal component convex to the left. No acute osseous abnormalities. IMPRESSION: 1. Cardiomegaly and pulmonary venous congestion without definitive evidence of acute cardiopulmonary disease on this hypoventilated examination. 2. Nonspecific mild gaseous distention of several loops of small bowel without definite evidence of enteric obstruction. 3. Indeterminate punctate (approximately 5 mm) opacity overlies the right mid abdomen, while potentially radiopaque ingested debris, potentially renal stone could have a similar appearance. Clinical correlation is advised. Further evaluation with noncontrast abdominal CT could be performed as indicated. Electronically Signed   By: Sandi Mariscal M.D.   On: 12/27/2015 18:12   Dg Abd Acute W/chest  12/16/2015  CLINICAL DATA:  60 year old female with abdominal pain nausea vomiting and diarrhea for 4 days. Initial encounter. EXAM: DG ABDOMEN ACUTE W/ 1V CHEST COMPARISON:  CT Abdomen and Pelvis 11/14/2015 and earlier. FINDINGS: Upright AP view of  the chest. Improved lung volumes and resolved left lung base effusion and opacity. No pneumothorax or pneumoperitoneum. Normal cardiac size and mediastinal contours. No new pulmonary opacity. Moderate gaseous distension of the stomach. Mild paucity of bowel gas elsewhere, and somewhat featureless appearance of gas-filled loops. There is gas throughout nondilated descending and proximal sigmoid colon. S-shaped scoliosis. No acute osseous abnormality identified. Stable cholecystectomy clips. IMPRESSION: 1. No abdominal free air. Moderately distended stomach with nonobstructed bowel-gas pattern otherwise. The somewhat featureless appearance of bowel loops however might reflect ileus. 2. Resolved left pleural effusion and airspace disease since March. No acute cardiopulmonary abnormality. Electronically Signed   By: Genevie Ann M.D.   On: 12/16/2015 10:58   Dg Abd Portable 1v  12/28/2015  CLINICAL DATA:  Distended abdomen EXAM: PORTABLE ABDOMEN - 1 VIEW COMPARISON:  CT 12/27/2015 FINDINGS: Contrast in the right colon from prior CT. Gas in large and small bowel compatible with ileus. Negative for bowel obstruction Contrast in urinary bladder from recent CT scan. Lumbar levoscoliosis. IMPRESSION: Mild ileus. Nonobstructive bowel gas pattern. Contrast in the right colon from CT yesterday. Electronically Signed   By: Franchot Gallo M.D.   On: 12/28/2015 09:37    EKG: sinus tachycardia, 110 bpm, old anterior infarct, nonspecific lateral st changes   Weights: Filed Weights   12/27/15 2302  Weight: 140 lb 9.6 oz (63.776 kg)     Physical Exam: Blood pressure 79/52, pulse 59, temperature 97.1 F (36.2 C), temperature source Oral, resp. rate 15, height 5' 3"  (1.6 m), weight 140 lb 9.6 oz (63.776 kg), SpO2 94 %. Body mass index is 24.91 kg/(m^2). General: Well developed, well nourished, in no acute distress. Head: Normocephalic, atraumatic, sclera non-icteric, no xanthomas, nares are without discharge.  Neck:  Negative for carotid bruits. JVD not elevated. Lungs: Clear bilaterally to auscultation without wheezes, rales, or rhonchi. Breathing is unlabored. Heart: RRR with S1 S2. No murmurs, rubs, or gallops appreciated. Abdomen: Soft, non-tender, non-distended with normoactive bowel sounds. No hepatomegaly. No rebound/guarding. No obvious abdominal masses. Msk:  Strength and tone appear normal for age. Extremities: No clubbing or cyanosis. No edema.  Distal pedal pulses are  2+ and equal bilaterally. Neuro: Alert and oriented X 3. No facial asymmetry. No focal deficit. Moves all extremities spontaneously. Psych:  Responds to questions appropriately with a normal affect.    Assessment and Plan:   1. Syncope: -Occuring x 2 s/p recurrent episode of vomiting and diarrhea -It remains unclear at this time if her symptoms are entirely orthostatic hypotension in the setting of the above vs multifactorial including some component of tachy-brady syndrome  -She has previously been seen by EP in January 2017 and felt to not be a candidate for PPM -Given this most recent episode of syncope we will have EP see her on 4/20 to get further recommendations of possible PPM -Has not been on amiodarone at home, would continue to hold this medication -Hold metoprolol given the above, as well as 2/2 her soft BP and need to dopamine infusion  -Recommend she be seen by GI, possibly outpatient, regarding her persistent symptoms  2. Tachy-brady syndrome: -Heart rate has been in sinus rhythm this admission -No documented evidence of HR in the 30s bpm on telemetry (patient did have NSR with PACs followed by pause not felt to be clinically significant) -Lowest HR seen was 58 bpm -EP to see as above  3. PAF: -Currently in sinus rhythm -EP to see for possible PPM -This way perhaps patient could restart amiodarone without concern for bradycardia -Eliquis 5 mg bid -CHADS2VASc at least 5 (HTN, diabetes, stroke x 2,  female)  4. Colitis with septic shock: -Wean dopamine as able -Colitis seems to be a recurring issue for her -Needs GI work up -Per IM/GI  5. History of stroke with residual left-sided weakness: -On Eliquis  -Patient's left-sided weakness is known   Melvern Banker, PA-C Pager: [phone] 12/28/2015, 11:09 AM

## 2015-12-28 NOTE — Progress Notes (Addendum)
[facility] [place] NAME: [name]    MR#:  [id number]  DATE OF BIRTH:  04-Apr-1956  SUBJECTIVE:  CHIEF COMPLAINT:   Chief Complaint  Patient presents with  . Code Stroke  . Abdominal Pain   No diarrhea today. Abdominal pain  REVIEW OF SYSTEMS:    Review of Systems  Constitutional: Positive for malaise/fatigue. Negative for fever and chills.  HENT: Negative for sore throat.   Eyes: Negative for blurred vision, double vision and pain.  Respiratory: Negative for cough, hemoptysis, shortness of breath and wheezing.   Cardiovascular: Negative for chest pain, palpitations, orthopnea and leg swelling.  Gastrointestinal: Negative for heartburn, nausea, vomiting, abdominal pain, diarrhea and constipation.  Genitourinary: Negative for dysuria and hematuria.  Musculoskeletal: Negative for back pain and joint pain.  Skin: Negative for rash.  Neurological: Positive for weakness. Negative for sensory change, speech change, focal weakness and headaches.  Endo/Heme/Allergies: Does not bruise/bleed easily.  Psychiatric/Behavioral: Negative for depression. The patient is not nervous/anxious.     DRUG ALLERGIES:   Allergies  Allergen Reactions  . Hydrocodone Other (See Comments)    Pt states that this medication caused cirrhosis of the liver.    . Erythromycin Other (See Comments)    Reaction:  Fever   . Prednisone Other (See Comments)    Reaction:  Unknown   . Rosiglitazone Maleate Swelling  . Codeine Sulfate Rash  . Tetanus-Diphtheria Toxoids Td Rash and Other (See Comments)    Reaction:  Fever     VITALS:  Blood pressure 79/52, pulse 59, temperature 97.1 F (36.2 C), temperature source Oral, resp. rate 15, height 5' 3"  (1.6 m), weight 63.776 kg (140 lb 9.6 oz), SpO2 94 %.  PHYSICAL EXAMINATION:   Physical Exam  GENERAL:  60 y.o.-year-old patient lying in the bed with no acute distress.  EYES: Pupils equal, round,  reactive to light and accommodation. No scleral icterus. Extraocular muscles intact.  HEENT: Head atraumatic, normocephalic. Oropharynx and nasopharynx clear.  NECK:  Supple, no jugular venous distention. No thyroid enlargement, no tenderness.  LUNGS: Normal breath sounds bilaterally, no wheezing, rales, rhonchi. No use of accessory muscles of respiration.  CARDIOVASCULAR: S1, S2 normal. No murmurs, rubs, or gallops.  ABDOMEN: Soft. Bowel sounds present. No organomegaly or mass. Tender and distended EXTREMITIES: No cyanosis, clubbing or edema b/l.    NEUROLOGIC: Cranial nerves II through XII are intact. Left upper and lower extremities 4/5. Right 5/5. Sensation decreased left. PSYCHIATRIC: The patient is alert and oriented x 3.  SKIN: No obvious rash, lesion, or ulcer.   LABORATORY PANEL:   CBC  Recent Labs Lab 12/27/15 1709  WBC 3.7  HGB 10.7*  HCT 32.6*  PLT 152   ------------------------------------------------------------------------------------------------------------------ Chemistries   Recent Labs Lab 12/27/15 1804  NA 137  K 4.0  CL 108  CO2 24  GLUCOSE 233*  BUN 9  CREATININE 0.62  CALCIUM 8.2*  AST 39  ALT 20  ALKPHOS 82  BILITOT 1.3*   ------------------------------------------------------------------------------------------------------------------  Cardiac Enzymes  Recent Labs Lab 12/27/15 1804  TROPONINI 0.04*   ------------------------------------------------------------------------------------------------------------------  RADIOLOGY:  Dg Chest 2 View  12/27/2015  CLINICAL DATA:  Weakness.  Cough. EXAM: CHEST  2 VIEW COMPARISON:  Chest from abdominal radiographs earlier this day at 1744 hour FINDINGS: Unchanged elevation of right hemidiaphragm. There is adjacent atelectasis or scarring at the lung bases. Cardiomediastinal contours are unchanged with stable cardiomegaly. Probable vascular congestion. No evidence pulmonary edema.  Trace left pleural  effusion IMPRESSION: Unchanged elevation of right hemidiaphragm. Unchanged cardiomegaly. No new abnormality is seen. Electronically Signed   By: Jeb Levering M.D.   On: 12/27/2015 23:50   Ct Head Wo Contrast  12/27/2015  CLINICAL DATA:  Abdominal pain for 4 days, nausea, vomiting, diarrhea, onset of LEFT-sided weakness at 1430 hours, history TIA, atrial fibrillation, diabetes mellitus, paroxysmal atrial fibrillation, essential hypertension, uterine cancer EXAM: CT HEAD WITHOUT CONTRAST TECHNIQUE: Contiguous axial images were obtained from the base of the skull through the vertex without intravenous contrast. COMPARISON:  11/11/2015 FINDINGS: Mild atrophy. Normal ventricular morphology. No midline shift or mass effect. Otherwise normal appearance of brain parenchyma. No intracranial hemorrhage, mass lesion, or evidence acute infarction. No extra-axial fluid collections. Visualized paranasal sinuses and mastoid air cells clear. Mild hyperostosis frontalis interna. No acute osseous findings. IMPRESSION: No acute intracranial abnormalities. Findings called to Dr. Reita Cliche on 12/27/2015 at 1703 hours. Electronically Signed   By: Lavonia Dana M.D.   On: 12/27/2015 17:04   Ct Abdomen Pelvis W Contrast  12/27/2015  CLINICAL DATA:  Vomiting and dizziness. Abdominal pain. Weakness. Lightheadedness. Cirrhosis. EXAM: CT ABDOMEN AND PELVIS WITH CONTRAST TECHNIQUE: Multidetector CT imaging of the abdomen and pelvis was performed using the standard protocol following bolus administration of intravenous contrast. CONTRAST:  112m ISOVUE-300 IOPAMIDOL (ISOVUE-300) INJECTION 61% COMPARISON:  Multiple exams, including 12/16/2015 CT scan FINDINGS: Lower chest: Mild interstitial accentuation in both lower lobes, similar to 12/16/2015. Cardiomegaly with left ventricular hypertrophy. Trace left pleural effusion. Trace pericardial effusion. Small uphill varices adjacent to the distal esophagus. Hepatobiliary: Advanced cirrhosis. No  focal liver mass seen. Cholecystectomy. Pancreas: Unremarkable Spleen: Unremarkable Adrenals/Urinary Tract: Adrenal glands normal. Right kidney lower pole 9 mm calculus, not appreciably changed. Small hypodense lesions of the right kidney are statistically likely to be cysts but technically too small to characterize. No hydronephrosis, ureteral calculus, or bladder calculus seen. Stomach/Bowel: Wall thickening of the stomach along the gastric cardia, somewhat resembling a fundoplication, correlate with operative history. Several mildly dilated loops of jejunum in the left abdomen, without a transition point to suggest obstruction. Equivocal wall thickening in the ascending colon. Vascular/Lymphatic: Aortoiliac atherosclerotic vascular disease. Gastric varices. Splenorenal shunting. Portal vein and splenic vein patent. No pathologic adenopathy identified. Reproductive: Uterus absent.  Ovaries not well seen. Other: Considerable ascites, increased from the exam 11 days ago. Increased mesenteric and subcutaneous edema. Musculoskeletal: Healing fractures the right anterior fourth, fifth, sixth, seventh, and eighth ribs with associated rib sclerosis, not appreciably changed from 12/16/2015. IMPRESSION: 1. Compared to the exam from 11 days ago, there is worsening third spacing of fluid with diffuse mesenteric and subcutaneous edema along with increase in ascites. 2. Cirrhosis. 3. Trace left pleural effusion and trace pericardial effusion. 4. Healing anterior fourth through eighth rib fractures on the right. 5. Cardiomegaly with left ventricular hypertrophy. 6. The uphill esophageal varices and gastric varices. Splenorenal shunting. 7. Nonobstructive right kidney lower pole 9 mm calculus. 8. Wall thickening along the gastric cardia, query prior fundoplication. 9. Equivocal wall thickening in the ascending colon, possibly from third spacing of fluid or mild proximal colitis. 10.  Aortoiliac atherosclerotic vascular disease.  Electronically Signed   By: WVan ClinesM.D.   On: 12/27/2015 21:19   Dg Abd Acute W/chest  12/27/2015  CLINICAL DATA:  Upper abdominal pain for the past 4 days. History of atrial fibrillation. EXAM: DG ABDOMEN ACUTE W/ 1V CHEST COMPARISON:  12/17/2015; 12/16/2015 FINDINGS: Grossly unchanged enlarged cardiac silhouette and  mediastinal contours given persistently reduced lung volumes. Minimal bilateral infrahilar opacities favored to represent atelectasis. Mild pulmonary venous congestion without frank evidence of edema. No pleural effusion or pneumothorax. Mild gas distention of multiple loops of small bowel without definitive evidence of enteric obstruction. Unremarkable colonic stool burden. No pneumoperitoneum, pneumatosis or portal venous gas. Indeterminate punctate (approximately 5 mm) opacity overlying the right mid abdomen. Several phleboliths overlie the lower pelvis bilaterally. Mild to moderate scoliotic curvature of the thoracolumbar spine with dominant caudal component convex to the left. No acute osseous abnormalities. IMPRESSION: 1. Cardiomegaly and pulmonary venous congestion without definitive evidence of acute cardiopulmonary disease on this hypoventilated examination. 2. Nonspecific mild gaseous distention of several loops of small bowel without definite evidence of enteric obstruction. 3. Indeterminate punctate (approximately 5 mm) opacity overlies the right mid abdomen, while potentially radiopaque ingested debris, potentially renal stone could have a similar appearance. Clinical correlation is advised. Further evaluation with noncontrast abdominal CT could be performed as indicated. Electronically Signed   By: Sandi Mariscal M.D.   On: 12/27/2015 18:12     ASSESSMENT AND PLAN:   60 year old female being admitted for possible acute stroke   * Ascending colitis with septic shock Check Abd xray, lactic acid. Dopamine Bolus NS 1 liter now. Blood cx. C diff pending but no diarrhea  now  * Left weakness - We will get an MRI of the brain and MRA of the head and neck - CT Head is negative in the emergency room - Aspirin and statin - Echo, lipid profile  * Chronic pain syndrome Will start pain meds when heart rate is improved.  * Bradycardia with Afib HR improved now. Could be due to narcotics but meds unchanged Now on Dopamine. Check echo Consult cardiology. Check TSH  * Diabetes: - Continue home dose of insulin. Consult diabetic nurse coordinator - Check A1c, add sliding scale insulin  * Cirrhosis Stable  All the records are reviewed and case discussed with Care Management/Social Workerr. Management plans discussed with the patient, family and they are in agreement.  CODE STATUS: DNR  DVT Prophylaxis: SCDs  TOTAL CC TIME TAKING CARE OF THIS PATIENT: 35 minutes.   POSSIBLE D/C IN 3-4 DAYS, DEPENDING ON CLINICAL CONDITION.  Hillary Bow R M.D on 12/28/2015 at 9:19 AM  Between 7am to 6pm - Pager - [phone]  After 6pm go to [email or web address] - password [email or web address] [facility]  Office  [phone]  CC: Primary care physician; [place] Pink, MD  Note: This dictation was prepared with Dragon dictation along with smaller phrase technology. Any transcriptional errors that result from this process are unintentional.

## 2015-12-28 NOTE — Progress Notes (Signed)
Admitted to room 259 with the diagnoses of stroke. Patient is alert and oriented x 4.  Tele box verified by the RN and Reesie NT. Patient oriented to the floor, staff, call bell and ascom. Skin assessment done with Lexie RN, no skin issues of concern but old scar to the left lateral thigh. Fall Neurosurgeon. Bed alarm activated.  Bed in the lowest position and call bed in place.

## 2015-12-28 NOTE — Progress Notes (Signed)
BP=73/37, Dr. Jannifer Franklin notified with a new order for another NS 500 mL to be given in 2 hours. Will continue to monitor.

## 2015-12-28 NOTE — Progress Notes (Signed)
*  PRELIMINARY RESULTS* Echocardiogram 2D Echocardiogram has been performed.  [name] 12/28/2015, 10:38 AM

## 2015-12-28 NOTE — Progress Notes (Signed)
Pharmacy Antibiotic Note  [name] is a 60 y.o. female admitted on 12/27/2015 with colitis with septic shock.  Pharmacy has been consulted for Zosyn dosing.  Plan: Zosyn 3.375g IV q8h (4 hour infusion).  Height: 5' 3"  (160 cm) Weight: 140 lb 9.6 oz (63.776 kg) IBW/kg (Calculated) : 52.4  Temp (24hrs), Avg:98.2 F (36.8 C), Min:97.1 F (36.2 C), Max:99.2 F (37.3 C)   Recent Labs Lab 12/27/15 1709 12/27/15 1804  WBC 3.7  --   CREATININE  --  0.62    Estimated Creatinine Clearance: 66.5 mL/min (by C-G formula based on Cr of 0.62).    Allergies  Allergen Reactions  . Hydrocodone Other (See Comments)    Pt states that this medication caused cirrhosis of the liver.    . Erythromycin Other (See Comments)    Reaction:  Fever   . Prednisone Other (See Comments)    Reaction:  Unknown   . Rosiglitazone Maleate Swelling  . Codeine Sulfate Rash  . Tetanus-Diphtheria Toxoids Td Rash and Other (See Comments)    Reaction:  Fever     Antimicrobials this admission: zosyn 4/19 >>  rifaximin  Dose adjustments this admission:   Microbiology results: Blood cx: pending C diff: pending 4/19 MRSA PCR: negative  Thank you for allowing pharmacy to be a part of this patient's care.  Napoleon Form 12/28/2015 12:53 PM

## 2015-12-29 ENCOUNTER — Inpatient Hospital Stay: Payer: Commercial Managed Care - HMO

## 2015-12-29 LAB — CBC WITH DIFFERENTIAL/PLATELET
Basophils Absolute: 0 10*3/uL (ref 0–0.1)
Basophils Relative: 0 %
EOS ABS: 0 10*3/uL (ref 0–0.7)
Eosinophils Relative: 0 %
HEMATOCRIT: 30.3 % — AB (ref 35.0–47.0)
Hemoglobin: 9.9 g/dL — ABNORMAL LOW (ref 12.0–16.0)
Lymphocytes Relative: 6 %
Lymphs Abs: 0.8 10*3/uL — ABNORMAL LOW (ref 1.0–3.6)
MCH: 29.7 pg (ref 26.0–34.0)
MCHC: 32.6 g/dL (ref 32.0–36.0)
MCV: 91.1 fL (ref 80.0–100.0)
MONO ABS: 0.5 10*3/uL (ref 0.2–0.9)
Monocytes Relative: 4 %
Neutro Abs: 12.5 10*3/uL — ABNORMAL HIGH (ref 1.4–6.5)
Neutrophils Relative %: 90 %
Platelets: 141 10*3/uL — ABNORMAL LOW (ref 150–440)
RBC: 3.33 MIL/uL — ABNORMAL LOW (ref 3.80–5.20)
RDW: 16.7 % — AB (ref 11.5–14.5)
WBC: 13.8 10*3/uL — ABNORMAL HIGH (ref 3.6–11.0)

## 2015-12-29 LAB — BLOOD CULTURE ID PANEL (REFLEXED)
Acinetobacter baumannii: NOT DETECTED
CANDIDA ALBICANS: NOT DETECTED
CANDIDA PARAPSILOSIS: NOT DETECTED
CANDIDA TROPICALIS: NOT DETECTED
CARBAPENEM RESISTANCE: NOT DETECTED
Candida glabrata: NOT DETECTED
Candida krusei: NOT DETECTED
ENTEROBACTER CLOACAE COMPLEX: NOT DETECTED
ENTEROCOCCUS SPECIES: NOT DETECTED
Enterobacteriaceae species: NOT DETECTED
Escherichia coli: NOT DETECTED
HAEMOPHILUS INFLUENZAE: NOT DETECTED
Klebsiella oxytoca: NOT DETECTED
Klebsiella pneumoniae: NOT DETECTED
LISTERIA MONOCYTOGENES: NOT DETECTED
METHICILLIN RESISTANCE: DETECTED — AB
Neisseria meningitidis: NOT DETECTED
PROTEUS SPECIES: NOT DETECTED
Pseudomonas aeruginosa: NOT DETECTED
SERRATIA MARCESCENS: NOT DETECTED
STAPHYLOCOCCUS AUREUS BCID: NOT DETECTED
STAPHYLOCOCCUS SPECIES: DETECTED — AB
STREPTOCOCCUS PYOGENES: NOT DETECTED
Streptococcus agalactiae: NOT DETECTED
Streptococcus pneumoniae: NOT DETECTED
Streptococcus species: NOT DETECTED
VANCOMYCIN RESISTANCE: NOT DETECTED

## 2015-12-29 LAB — GLUCOSE, CAPILLARY
Glucose-Capillary: 132 mg/dL — ABNORMAL HIGH (ref 65–99)
Glucose-Capillary: 174 mg/dL — ABNORMAL HIGH (ref 65–99)
Glucose-Capillary: 84 mg/dL (ref 65–99)
Glucose-Capillary: 150 mg/dL — ABNORMAL HIGH (ref 65–99)

## 2015-12-29 LAB — COMPREHENSIVE METABOLIC PANEL
ALBUMIN: 2.9 g/dL — AB (ref 3.5–5.0)
ALT: 21 U/L (ref 14–54)
ANION GAP: 5 (ref 5–15)
AST: 26 U/L (ref 15–41)
Alkaline Phosphatase: 84 U/L (ref 38–126)
BILIRUBIN TOTAL: 0.7 mg/dL (ref 0.3–1.2)
BUN: 13 mg/dL (ref 6–20)
CO2: 22 mmol/L (ref 22–32)
Calcium: 8 mg/dL — ABNORMAL LOW (ref 8.9–10.3)
Chloride: 110 mmol/L (ref 101–111)
Creatinine, Ser: 0.56 mg/dL (ref 0.44–1.00)
GFR calc non Af Amer: >60 mL/min (ref >60)
GLUCOSE: 185 mg/dL — AB (ref 65–99)
POTASSIUM: 3.3 mmol/L — AB (ref 3.5–5.1)
SODIUM: 137 mmol/L (ref 135–145)
TOTAL PROTEIN: 5.5 g/dL — AB (ref 6.5–8.1)

## 2015-12-29 LAB — C DIFFICILE QUICK SCREEN W PCR REFLEX
C Diff antigen: NEGATIVE
C Diff interpretation: NEGATIVE
C Diff toxin: NEGATIVE

## 2015-12-29 MED ORDER — ALBUTEROL SULFATE HFA 108 (90 BASE) MCG/ACT IN AERS: 2.0000 | INHALATION_SPRAY | RESPIRATORY_TRACT | Status: DC | PRN

## 2015-12-29 MED ORDER — LEVOTHYROXINE SODIUM 100 MCG PO TABS
100.0000 ug | ORAL_TABLET | Freq: Every day | ORAL | Status: DC
  Administered 2015-12-30 – 2016-01-01 (×3): 100 ug via ORAL
  Filled 2015-12-29 (×3): qty 1

## 2015-12-29 MED ORDER — LACTULOSE 10 GM/15ML PO SOLN
30.0000 g | Freq: Two times a day (BID) | ORAL | Status: DC
  Administered 2015-12-30 (×2): 30 g via ORAL
  Filled 2015-12-29 (×2): qty 60

## 2015-12-29 MED ORDER — POTASSIUM CHLORIDE CRYS ER 20 MEQ PO TBCR
40.0000 meq | EXTENDED_RELEASE_TABLET | Freq: Once | ORAL | Status: AC
Start: 2015-12-29 — End: 2015-12-29
  Administered 2015-12-29: 40 meq via ORAL
  Filled 2015-12-29: qty 2

## 2015-12-29 MED ORDER — LACTULOSE 10 GM/15ML PO SOLN: 30.0000 g | Freq: Two times a day (BID) | ORAL | Status: DC

## 2015-12-29 MED ORDER — ALBUTEROL SULFATE (2.5 MG/3ML) 0.083% IN NEBU: 3.0000 mL | INHALATION_SOLUTION | RESPIRATORY_TRACT | Status: DC | PRN

## 2015-12-29 MED ORDER — MAGNESIUM CITRATE PO SOLN
1.0000 | Freq: Once | ORAL | Status: AC
  Administered 2015-12-29: 1 via ORAL
  Filled 2015-12-29: qty 296

## 2015-12-29 MED ORDER — BISACODYL 10 MG RE SUPP
10.0000 mg | Freq: Once | RECTAL | Status: DC
  Filled 2015-12-29: qty 1

## 2015-12-29 MED ORDER — GADOBENATE DIMEGLUMINE 529 MG/ML IV SOLN
15.0000 mL | Freq: Once | INTRAVENOUS | Status: AC | PRN
  Administered 2015-12-29: 13 mL via INTRAVENOUS

## 2015-12-29 MED ORDER — POTASSIUM CHLORIDE CRYS ER 20 MEQ PO TBCR
40.0000 meq | EXTENDED_RELEASE_TABLET | Freq: Once | ORAL | Status: AC
  Administered 2015-12-29: 40 meq via ORAL
  Filled 2015-12-29: qty 2

## 2015-12-29 NOTE — Progress Notes (Signed)
Patient had episode of bloody discharge from vagina. RN to assess patient, blood did appear to originate from vagina, patient reports she had a hysterectomy years ago. RN saw the tissue patient used to wipe her groin and it was lightly saturated with blood, but upon assessment RN did not see any fresh discharge. Patient does not report any pain at this time. Dr. Darvin Neighbours paged and aware, no further orders, will continue to monitor.

## 2015-12-29 NOTE — Progress Notes (Signed)
Inpatient Diabetes Program Recommendations  AACE/ADA: New Consensus Statement on Inpatient Glycemic Control (2015)  Target Ranges:  Prepandial:   less than 140 mg/dL      Peak postprandial:   less than 180 mg/dL (1-2 hours)      Critically ill patients:  140 - 180 mg/dL   Review of Glycemic Control  Diabetes history: Type 2 x 10 years Outpatient Diabetes medications:Levemir 17 units q day, Novolog tid with meals Current orders for Inpatient glycemic control: Levemir 17 units QHS, Novolog 0-9 units TID with meals, Novolog 0-5 units QHS, Novolog 3 units TID with meals for meal coverage  Inpatient Diabetes Program Recommendations: Agree with current orders for diabetes management.  [name], RN, BA, MHA, CDE Diabetes Coordinator Inpatient Diabetes Program  [phone] (Team Pager) [phone] ([place]) 12/29/2015 10:48 AM

## 2015-12-29 NOTE — Progress Notes (Signed)
Occupational Therapy Treatment Patient Details Name: [name] MRN: [id number] DOB: Apr 06, 1956 Today's Date: 12/29/2015    History of present illness 60 yo F presented to ED with abdominal pain with N/V/D for 4 days. She also reported having acute L sided weakness with hx of CVAs and being worked up for possible new CVA.Marland Kitchen She is currently on enteric precautions for possible c-diff. PMH includes DM I, cancer, TIA, PAF, and cirrhosis of the liver not due to alcohol.   OT comments  Patient seen this date for OT tx session with focus on self feeding with use of dycem under plate to ensure stability, discussed rocker knife use and will plan to bring in tomorrow for demonstration.  Patient reports she thinks her adaptive equipment catalog got thrown away and needs another.  She was able to demo use of dycem and left her with a large piece to cut once she is home for plate, bowl and to open containers. Patient also seen for [facility] and strengthening exercise for LUE as outlined above.  Fatigues quickly and requires rest breaks between sets of 5 reps. Continue to work towards increased strength and coordination skills to perform necessary daily tasks.   Follow Up Recommendations  SNF    Equipment Recommendations  3 in 1 bedside comode    Recommendations for Other Services      Precautions / Restrictions Precautions Precautions: Fall Restrictions Weight Bearing Restrictions: No       Mobility Bed Mobility Overal bed mobility: Needs Assistance Bed Mobility: Supine to Sit;Sit to Supine     Supine to sit: Min assist;HOB elevated Sit to supine: Min assist;HOB elevated      Transfers                      Balance                                   ADL Overall ADL's : Needs assistance/impaired Eating/Feeding: Set up;Minimal assistance Eating/Feeding Details (indicate cue type and reason): provided dycem to place under plate to kept it stable.  Will try rocker  knife next date.  Grooming: Bed level;Minimal assistance                                        Vision                     Perception     Praxis      Cognition   Behavior During Therapy: WFL for tasks assessed/performed Overall Cognitive Status: Within Functional Limits for tasks assessed                       Extremity/Trunk Assessment               Exercises Other Exercises Other Exercises: Patient seen for LUE exercises AAROM for shoulder flexion, ABD for 10 reps, towel exercises BUE for shoulder flexion, ABD, chest press, circles forwards and backwards for 2 sets of 5 reps each.  rest breaks as needed. Towel knotting/unknotting exercises to work on UB strength and hand strength.    Shoulder Instructions       General Comments      Pertinent Vitals/ Pain       Pain Assessment: No/denies pain Pain Score:  0-No pain  Home Living                                          Prior Functioning/Environment              Frequency Min 1X/week     Progress Toward Goals  OT Goals(current goals can now be found in the care plan section)  Progress towards OT goals: Progressing toward goals  Acute Rehab OT Goals Patient Stated Goal: "to get some food and get home soon" OT Goal Formulation: With patient Time For Goal Achievement: 01/11/16 Potential to Achieve Goals: Good  Plan Discharge plan remains appropriate    Co-evaluation                 End of Session     Activity Tolerance Patient limited by fatigue   Patient Left in bed;with bed alarm set;with call bell/phone within reach   Nurse Communication          Time: 1660-6301 OT Time Calculation (min): 35 min  Charges: OT General Charges $OT Visit: 1 Procedure OT Treatments $Self Care/Home Management : 8-22 mins $Therapeutic Exercise: 8-22 mins [name] T Lovett, OTR/L, CLT  Lovett,[name] 12/29/2015, 4:21 PM

## 2015-12-29 NOTE — Progress Notes (Signed)
[place] [facility] [place] NAME: [name]    MR#:  [id number]  DATE OF BIRTH:  [birth year]  SUBJECTIVE:  CHIEF COMPLAINT:   Chief Complaint  Patient presents with  . Code Stroke  . Abdominal Pain   No diarrhea today. Abdominal pain  REVIEW OF SYSTEMS:    Review of Systems  Constitutional: Positive for malaise/fatigue. Negative for fever and chills.  HENT: Negative for sore throat.   Eyes: Negative for blurred vision, double vision and pain.  Respiratory: Negative for cough, hemoptysis, shortness of breath and wheezing.   Cardiovascular: Negative for chest pain, palpitations, orthopnea and leg swelling.  Gastrointestinal: Negative for heartburn, nausea, vomiting, abdominal pain, diarrhea and constipation.  Genitourinary: Negative for dysuria and hematuria.  Musculoskeletal: Negative for back pain and joint pain.  Skin: Negative for rash.  Neurological: Positive for weakness. Negative for sensory change, speech change, focal weakness and headaches.  Endo/Heme/Allergies: Does not bruise/bleed easily.  Psychiatric/Behavioral: Negative for depression. The patient is not nervous/anxious.     DRUG ALLERGIES:   Allergies  Allergen Reactions  . Hydrocodone Other (See Comments)    Pt states that this medication caused cirrhosis of the liver.    . Erythromycin Other (See Comments)    Reaction:  Fever   . Prednisone Other (See Comments)    Reaction:  Unknown   . Rosiglitazone Maleate Swelling  . Codeine Sulfate Rash  . Tetanus-Diphtheria Toxoids Td Rash and Other (See Comments)    Reaction:  Fever     VITALS:  Blood pressure 113/78, pulse 119, temperature 97.4 F (36.3 C), temperature source Oral, resp. rate 20, height 5' 3"  (1.6 m), weight 67.314 kg (148 lb 6.4 oz), SpO2 93 %.  PHYSICAL EXAMINATION:   Physical Exam  GENERAL:  60 y.o.-year-old patient lying in the bed with no acute distress.  EYES: Pupils equal, round,  reactive to light and accommodation. No scleral icterus. Extraocular muscles intact.  HEENT: Head atraumatic, normocephalic. Oropharynx and nasopharynx clear.  NECK:  Supple, no jugular venous distention. No thyroid enlargement, no tenderness.  LUNGS: Normal breath sounds bilaterally, no wheezing, rales, rhonchi. No use of accessory muscles of respiration.  CARDIOVASCULAR: S1, S2 tachycardic and irregular. No murmurs, rubs, or gallops.  ABDOMEN: Soft. Bowel sounds present. No organomegaly or mass. Tender and distended EXTREMITIES: No cyanosis, clubbing or edema b/l.    NEUROLOGIC: Cranial nerves II through XII are intact. Left upper and lower extremities 4/5. Right 5/5. Sensation decreased left. PSYCHIATRIC: The patient is alert and oriented x 3.  SKIN: No obvious rash, lesion, or ulcer.   LABORATORY PANEL:   CBC  Recent Labs Lab 12/29/15 0419  WBC 13.8*  HGB 9.9*  HCT 30.3*  PLT 141*   ------------------------------------------------------------------------------------------------------------------ Chemistries   Recent Labs Lab 12/29/15 0419  NA 137  K 3.3*  CL 110  CO2 22  GLUCOSE 185*  BUN 13  CREATININE 0.56  CALCIUM 8.0*  AST 26  ALT 21  ALKPHOS 84  BILITOT 0.7   ------------------------------------------------------------------------------------------------------------------  Cardiac Enzymes  Recent Labs Lab 12/27/15 1804  TROPONINI 0.04*   ------------------------------------------------------------------------------------------------------------------  RADIOLOGY:  Dg Chest 2 View  12/27/2015  CLINICAL DATA:  Weakness.  Cough. EXAM: CHEST  2 VIEW COMPARISON:  Chest from abdominal radiographs earlier this day at 1744 hour FINDINGS: Unchanged elevation of right hemidiaphragm. There is adjacent atelectasis or scarring at the lung bases. Cardiomediastinal contours are unchanged with stable cardiomegaly. Probable vascular congestion. No evidence  pulmonary  edema. Trace left pleural effusion IMPRESSION: Unchanged elevation of right hemidiaphragm. Unchanged cardiomegaly. No new abnormality is seen. Electronically Signed   By: Jeb Levering M.D.   On: 12/27/2015 23:50   Dg Abd 1 View  12/29/2015  CLINICAL DATA:  Ascending colitis with septic shock.  Ileus. EXAM: ABDOMEN - 1 VIEW COMPARISON:  None. FINDINGS: Gaseous distension of the colon identified. There is been interval increase in caliber of the large bowel loops with decrease and small bowel air. Enteric contrast material remains within the right colon. IMPRESSION: 1. Imaging findings compatible with colonic ileus. Electronically Signed   By: Kerby Moors M.D.   On: 12/29/2015 08:57   Ct Head Wo Contrast  12/27/2015  CLINICAL DATA:  Abdominal pain for 4 days, nausea, vomiting, diarrhea, onset of LEFT-sided weakness at 1430 hours, history TIA, atrial fibrillation, diabetes mellitus, paroxysmal atrial fibrillation, essential hypertension, uterine cancer EXAM: CT HEAD WITHOUT CONTRAST TECHNIQUE: Contiguous axial images were obtained from the base of the skull through the vertex without intravenous contrast. COMPARISON:  11/11/2015 FINDINGS: Mild atrophy. Normal ventricular morphology. No midline shift or mass effect. Otherwise normal appearance of brain parenchyma. No intracranial hemorrhage, mass lesion, or evidence acute infarction. No extra-axial fluid collections. Visualized paranasal sinuses and mastoid air cells clear. Mild hyperostosis frontalis interna. No acute osseous findings. IMPRESSION: No acute intracranial abnormalities. Findings called to Dr. Reita Cliche on 12/27/2015 at 1703 hours. Electronically Signed   By: Lavonia Dana M.D.   On: 12/27/2015 17:04   Ct Abdomen Pelvis W Contrast  12/27/2015  CLINICAL DATA:  Vomiting and dizziness. Abdominal pain. Weakness. Lightheadedness. Cirrhosis. EXAM: CT ABDOMEN AND PELVIS WITH CONTRAST TECHNIQUE: Multidetector CT imaging of the abdomen and pelvis was  performed using the standard protocol following bolus administration of intravenous contrast. CONTRAST:  148m ISOVUE-300 IOPAMIDOL (ISOVUE-300) INJECTION 61% COMPARISON:  Multiple exams, including 12/16/2015 CT scan FINDINGS: Lower chest: Mild interstitial accentuation in both lower lobes, similar to 12/16/2015. Cardiomegaly with left ventricular hypertrophy. Trace left pleural effusion. Trace pericardial effusion. Small uphill varices adjacent to the distal esophagus. Hepatobiliary: Advanced cirrhosis. No focal liver mass seen. Cholecystectomy. Pancreas: Unremarkable Spleen: Unremarkable Adrenals/Urinary Tract: Adrenal glands normal. Right kidney lower pole 9 mm calculus, not appreciably changed. Small hypodense lesions of the right kidney are statistically likely to be cysts but technically too small to characterize. No hydronephrosis, ureteral calculus, or bladder calculus seen. Stomach/Bowel: Wall thickening of the stomach along the gastric cardia, somewhat resembling a fundoplication, correlate with operative history. Several mildly dilated loops of jejunum in the left abdomen, without a transition point to suggest obstruction. Equivocal wall thickening in the ascending colon. Vascular/Lymphatic: Aortoiliac atherosclerotic vascular disease. Gastric varices. Splenorenal shunting. Portal vein and splenic vein patent. No pathologic adenopathy identified. Reproductive: Uterus absent.  Ovaries not well seen. Other: Considerable ascites, increased from the exam 11 days ago. Increased mesenteric and subcutaneous edema. Musculoskeletal: Healing fractures the right anterior fourth, fifth, sixth, seventh, and eighth ribs with associated rib sclerosis, not appreciably changed from 12/16/2015. IMPRESSION: 1. Compared to the exam from 11 days ago, there is worsening third spacing of fluid with diffuse mesenteric and subcutaneous edema along with increase in ascites. 2. Cirrhosis. 3. Trace left pleural effusion and trace  pericardial effusion. 4. Healing anterior fourth through eighth rib fractures on the right. 5. Cardiomegaly with left ventricular hypertrophy. 6. The uphill esophageal varices and gastric varices. Splenorenal shunting. 7. Nonobstructive right kidney lower pole 9 mm calculus. 8. Wall thickening along the gastric  cardia, query prior fundoplication. 9. Equivocal wall thickening in the ascending colon, possibly from third spacing of fluid or mild proximal colitis. 10.  Aortoiliac atherosclerotic vascular disease. Electronically Signed   By: Van Clines M.D.   On: 12/27/2015 21:19   Dg Abd Acute W/chest  12/27/2015  CLINICAL DATA:  Upper abdominal pain for the past 4 days. History of atrial fibrillation. EXAM: DG ABDOMEN ACUTE W/ 1V CHEST COMPARISON:  12/17/2015; 12/16/2015 FINDINGS: Grossly unchanged enlarged cardiac silhouette and mediastinal contours given persistently reduced lung volumes. Minimal bilateral infrahilar opacities favored to represent atelectasis. Mild pulmonary venous congestion without frank evidence of edema. No pleural effusion or pneumothorax. Mild gas distention of multiple loops of small bowel without definitive evidence of enteric obstruction. Unremarkable colonic stool burden. No pneumoperitoneum, pneumatosis or portal venous gas. Indeterminate punctate (approximately 5 mm) opacity overlying the right mid abdomen. Several phleboliths overlie the lower pelvis bilaterally. Mild to moderate scoliotic curvature of the thoracolumbar spine with dominant caudal component convex to the left. No acute osseous abnormalities. IMPRESSION: 1. Cardiomegaly and pulmonary venous congestion without definitive evidence of acute cardiopulmonary disease on this hypoventilated examination. 2. Nonspecific mild gaseous distention of several loops of small bowel without definite evidence of enteric obstruction. 3. Indeterminate punctate (approximately 5 mm) opacity overlies the right mid abdomen, while  potentially radiopaque ingested debris, potentially renal stone could have a similar appearance. Clinical correlation is advised. Further evaluation with noncontrast abdominal CT could be performed as indicated. Electronically Signed   By: Sandi Mariscal M.D.   On: 12/27/2015 18:12   Dg Abd Portable 1v  12/28/2015  CLINICAL DATA:  Distended abdomen EXAM: PORTABLE ABDOMEN - 1 VIEW COMPARISON:  CT 12/27/2015 FINDINGS: Contrast in the right colon from prior CT. Gas in large and small bowel compatible with ileus. Negative for bowel obstruction Contrast in urinary bladder from recent CT scan. Lumbar levoscoliosis. IMPRESSION: Mild ileus. Nonobstructive bowel gas pattern. Contrast in the right colon from CT yesterday. Electronically Signed   By: Franchot Gallo M.D.   On: 12/28/2015 09:37     ASSESSMENT AND PLAN:   60 year old female being admitted for possible acute stroke   * Ascending colitis with septic shock Septic shock has resolved. On IV Zosyn. No further diarrhea. Continue antibiotics. But cultures pending.  * Left weakness - We will get an MRI of the brain and MRA of the head and neck - CT Head is negative in the emergency room - Aspirin and statin - Echo showed no thrombus.  * Chronic pain syndrome Will start pain meds when heart rate is improved.  * Tachycardia Bradycardia with Afib HR improved and now tachycardic Patient has tachybradycardia syndrome. Dr. Caryl Comes with electrophysiology to see today. Appreciate cardiology input  * Diabetes: - Continue home dose of insulin. Consult diabetic nurse coordinator - Check A1c, added sliding scale insulin  * Ileus Dulcolax suppository and mag citrate.  * Cirrhosis Stable  All the records are reviewed and case discussed with Care Management/Social Workerr. Management plans discussed with the patient, family and they are in agreement.  CODE STATUS: DNR  DVT Prophylaxis: SCDs  TOTAL TIME TAKING CARE OF THIS PATIENT: 35  minutes.   POSSIBLE D/C IN 3-4 DAYS, DEPENDING ON CLINICAL CONDITION.  Hillary Bow R M.D on 12/29/2015 at 11:00 AM  Between 7am to 6pm - Pager - [phone]  After 6pm go to [email or web address] - password [email or web address] [facility]  [facility]  Office  [phone]  CC: Primary care physician; [place],  [name], MD  Note: This dictation was prepared with Dragon dictation along with smaller phrase technology. Any transcriptional errors that result from this process are unintentional.

## 2015-12-29 NOTE — Progress Notes (Signed)
Patient: [name] / Admit Date: 12/27/2015 / Date of Encounter: 12/29/2015, 7:25 AM   Subjective: Transferred back to 2A. Echo showed EF 60-65%, normal wall motion, GR1DD, mild MR, left atrium was mildly dilated, RV systolic function was normal, PASP normal. Has been weaned off dopamine infusion. Currently in sinus tach with HR in the 120's. WBC trended from 3.7 on 4/18 to 13.8 this morning. She developed a productive cough with yellow sputum on 4/19. Potassium 3.3 this morning. Abdominal plain film with mild ileus on 4/19. BP stable.   Review of Systems: Review of Systems  Constitutional: Positive for chills, weight loss and malaise/fatigue. Negative for fever and diaphoresis.  HENT: Positive for congestion.   Eyes: Negative for discharge and redness.  Respiratory: Positive for cough, sputum production, shortness of breath and wheezing. Negative for hemoptysis.        Yellow sputum  Cardiovascular: Positive for palpitations and leg swelling. Negative for chest pain, orthopnea, claudication and PND.  Gastrointestinal: Positive for abdominal pain and diarrhea. Negative for heartburn, nausea, vomiting, blood in stool and melena.  Musculoskeletal: Negative for myalgias and falls.  Skin: Negative for rash.  Neurological: Positive for weakness. Negative for dizziness, tingling, tremors, sensory change, speech change, focal weakness and loss of consciousness.  Endo/Heme/Allergies: Does not bruise/bleed easily.  Psychiatric/Behavioral: The patient is not nervous/anxious.     Objective: Telemetry: sinus tachycardia, 120's bpm Physical Exam: Blood pressure 113/78, pulse 119, temperature 97.4 F (36.3 C), temperature source Oral, resp. rate 20, height 5' 3"  (1.6 m), weight 148 lb 6.4 oz (67.314 kg), SpO2 93 %. Body mass index is 26.29 kg/(m^2). General: Well developed, well nourished, in no acute distress. Head: Normocephalic, atraumatic, sclera non-icteric, no xanthomas, nares are  without discharge. Neck: Negative for carotid bruits. JVP not elevated. Lungs: Clear bilaterally to auscultation without wheezes, rales, or rhonchi. Breathing is unlabored. Heart: Tachycardic, S1 S2 without murmurs, rubs, or gallops.  Abdomen: Soft, non-tender, non-distended with normoactive bowel sounds. No rebound/guarding. Extremities: No clubbing or cyanosis. Trace pre-tibial edema along bilateral LE.  Neuro: Alert and oriented X 3. Moves all extremities spontaneously. Psych:  Responds to questions appropriately with a normal affect.   Intake/Output Summary (Last 24 hours) at 12/29/15 0725 Last data filed at 12/29/15 8338  Gross per 24 hour  Intake 1608.33 ml  Output    450 ml  Net 1158.33 ml    Inpatient Medications:  . apixaban  5 mg Oral BID  . aspirin  300 mg Rectal Daily   Or  . aspirin  325 mg Oral Daily  . atorvastatin  40 mg Oral q1800  . citalopram  20 mg Oral Daily  . dicyclomine  10 mg Oral TID PC & HS  . insulin aspart  0-5 Units Subcutaneous QHS  . insulin aspart  0-9 Units Subcutaneous TID WC  . insulin aspart  3 Units Subcutaneous TID WC  . insulin detemir  17 Units Subcutaneous QHS  . levothyroxine  75 mcg Oral QAC breakfast  . magnesium oxide  400 mg Oral BID  . metoCLOPramide  5 mg Oral TID AC & HS  . mometasone-formoterol  2 puff Inhalation BID  . morphine  60 mg Oral TID  . pantoprazole  40 mg Oral BID AC  . piperacillin-tazobactam (ZOSYN)  IV  3.375 g Intravenous Q8H  . rifaximin  550 mg Oral BID   Infusions:  . sodium chloride 125 mL/hr at 12/28/15 2330  . DOPamine Stopped (12/28/15 1330)  Labs:  Recent Labs  12/27/15 1804 12/29/15 0419  NA 137 137  K 4.0 3.3*  CL 108 110  CO2 24 22  GLUCOSE 233* 185*  BUN 9 13  CREATININE 0.62 0.56  CALCIUM 8.2* 8.0*    Recent Labs  12/27/15 1804 12/29/15 0419  AST 39 26  ALT 20 21  ALKPHOS 82 84  BILITOT 1.3* 0.7  PROT 5.7* 5.5*  ALBUMIN 2.9* 2.9*    Recent Labs  12/27/15 1709  12/29/15 0419  WBC 3.7 13.8*  NEUTROABS 2.7 12.5*  HGB 10.7* 9.9*  HCT 32.6* 30.3*  MCV 90.1 91.1  PLT 152 141*    Recent Labs  12/27/15 1804  TROPONINI 0.04*   Invalid input(s): POCBNP  Recent Labs  12/27/15 1709  HGBA1C 8.2*     Weights: Filed Weights   12/27/15 2302 12/28/15 2324  Weight: 140 lb 9.6 oz (63.776 kg) 148 lb 6.4 oz (67.314 kg)     Radiology/Studies:  Dg Chest 2 View  12/27/2015  CLINICAL DATA:  Weakness.  Cough. EXAM: CHEST  2 VIEW COMPARISON:  Chest from abdominal radiographs earlier this day at 1744 hour FINDINGS: Unchanged elevation of right hemidiaphragm. There is adjacent atelectasis or scarring at the lung bases. Cardiomediastinal contours are unchanged with stable cardiomegaly. Probable vascular congestion. No evidence pulmonary edema. Trace left pleural effusion IMPRESSION: Unchanged elevation of right hemidiaphragm. Unchanged cardiomegaly. No new abnormality is seen. Electronically Signed   By: Jeb Levering M.D.   On: 12/27/2015 23:50   Dg Chest 2 View  12/17/2015  CLINICAL DATA:  Recent onset of cough; admitted yesterday; recently hospitalized for pneumonia; hx/o cirrhosis, htn, paf, and cancer of uterus EXAM: CHEST  2 VIEW COMPARISON:  12/16/2015 FINDINGS: Since the previous day's study, areas of airspace opacity have developed, most evident in the right upper lobe, but also noted in the right lower lobe and left lower lobe. No pleural effusion or pneumothorax. Cardiac silhouette is normal in size and configuration. No mediastinal or hilar masses or convincing adenopathy. Bony thorax is intact. IMPRESSION: 1. Multifocal pneumonia has become apparent since the previous day's study. Electronically Signed   By: Lajean Manes M.D.   On: 12/17/2015 12:23   Ct Head Wo Contrast  12/27/2015  CLINICAL DATA:  Abdominal pain for 4 days, nausea, vomiting, diarrhea, onset of LEFT-sided weakness at 1430 hours, history TIA, atrial fibrillation, diabetes mellitus,  paroxysmal atrial fibrillation, essential hypertension, uterine cancer EXAM: CT HEAD WITHOUT CONTRAST TECHNIQUE: Contiguous axial images were obtained from the base of the skull through the vertex without intravenous contrast. COMPARISON:  11/11/2015 FINDINGS: Mild atrophy. Normal ventricular morphology. No midline shift or mass effect. Otherwise normal appearance of brain parenchyma. No intracranial hemorrhage, mass lesion, or evidence acute infarction. No extra-axial fluid collections. Visualized paranasal sinuses and mastoid air cells clear. Mild hyperostosis frontalis interna. No acute osseous findings. IMPRESSION: No acute intracranial abnormalities. Findings called to Dr. Reita Cliche on 12/27/2015 at 1703 hours. Electronically Signed   By: Lavonia Dana M.D.   On: 12/27/2015 17:04   Ct Chest Wo Contrast  12/17/2015  CLINICAL DATA:  Diagnosed with pneumonia 1 month ago. Coughing and wheezing has increased over the last couple days. Shortness of breath today. EXAM: CT CHEST WITHOUT CONTRAST TECHNIQUE: Multidetector CT imaging of the chest was performed following the standard protocol without IV contrast. COMPARISON:  Chest x-rays dated 12/17/2015 and 11/14/2015. FINDINGS: Mediastinum/Lymph Nodes: Heart size is upper normal. No pericardial effusion. Scattered coronary artery calcifications noted. No  masses or enlarged lymph nodes seen within the mediastinum or perihilar regions. Lungs/Pleura: Patchy consolidations throughout both lungs, involving all lobes, most dense within the right upper lobe and at the left lung base posteriorly. Majority of the consolidations are associated with ground-glass opacities suggesting atypical pneumonia or associated edema. Trachea and central bronchi are unremarkable. Upper abdomen: Free ascites is seen within the upper abdomen. Liver appears cirrhotic. Patient is status post cholecystectomy. Thickening of the walls of the upper right colon appear similar to the appearance on CT abdomen  of 12/16/2015 compatible with the description of colitis on the associated report. Musculoskeletal: Mild scoliosis of the thoracic spine. No evidence of acute osseous abnormality. Superficial soft tissues are unremarkable. IMPRESSION: 1. Multi lobar consolidations, most dense within the right upper lobe and at the left lung base posteriorly, with areas of associated ground-glass opacity suggesting atypical pneumonia and/or edema. Differential includes atypical pneumonias such as viral or fungal, interstitial pneumonias, edema related to volume overload/CHF, chronic interstitial diseases, hypersensitivity pneumonitis, and respiratory bronchiolitis. 2. Upper abdominal ascites, presumably associated with liver cirrhosis. Thickening of the walls of the upper ascending colon similar to its appearance on CT abdomen of 12/16/2015, compatible with the previous description of colitis on the earlier CT report. Electronically Signed   By: Franki Cabot M.D.   On: 12/17/2015 17:13   Ct Abdomen Pelvis W Contrast  12/27/2015  CLINICAL DATA:  Vomiting and dizziness. Abdominal pain. Weakness. Lightheadedness. Cirrhosis. EXAM: CT ABDOMEN AND PELVIS WITH CONTRAST TECHNIQUE: Multidetector CT imaging of the abdomen and pelvis was performed using the standard protocol following bolus administration of intravenous contrast. CONTRAST:  160m ISOVUE-300 IOPAMIDOL (ISOVUE-300) INJECTION 61% COMPARISON:  Multiple exams, including 12/16/2015 CT scan FINDINGS: Lower chest: Mild interstitial accentuation in both lower lobes, similar to 12/16/2015. Cardiomegaly with left ventricular hypertrophy. Trace left pleural effusion. Trace pericardial effusion. Small uphill varices adjacent to the distal esophagus. Hepatobiliary: Advanced cirrhosis. No focal liver mass seen. Cholecystectomy. Pancreas: Unremarkable Spleen: Unremarkable Adrenals/Urinary Tract: Adrenal glands normal. Right kidney lower pole 9 mm calculus, not appreciably changed. Small  hypodense lesions of the right kidney are statistically likely to be cysts but technically too small to characterize. No hydronephrosis, ureteral calculus, or bladder calculus seen. Stomach/Bowel: Wall thickening of the stomach along the gastric cardia, somewhat resembling a fundoplication, correlate with operative history. Several mildly dilated loops of jejunum in the left abdomen, without a transition point to suggest obstruction. Equivocal wall thickening in the ascending colon. Vascular/Lymphatic: Aortoiliac atherosclerotic vascular disease. Gastric varices. Splenorenal shunting. Portal vein and splenic vein patent. No pathologic adenopathy identified. Reproductive: Uterus absent.  Ovaries not well seen. Other: Considerable ascites, increased from the exam 11 days ago. Increased mesenteric and subcutaneous edema. Musculoskeletal: Healing fractures the right anterior fourth, fifth, sixth, seventh, and eighth ribs with associated rib sclerosis, not appreciably changed from 12/16/2015. IMPRESSION: 1. Compared to the exam from 11 days ago, there is worsening third spacing of fluid with diffuse mesenteric and subcutaneous edema along with increase in ascites. 2. Cirrhosis. 3. Trace left pleural effusion and trace pericardial effusion. 4. Healing anterior fourth through eighth rib fractures on the right. 5. Cardiomegaly with left ventricular hypertrophy. 6. The uphill esophageal varices and gastric varices. Splenorenal shunting. 7. Nonobstructive right kidney lower pole 9 mm calculus. 8. Wall thickening along the gastric cardia, query prior fundoplication. 9. Equivocal wall thickening in the ascending colon, possibly from third spacing of fluid or mild proximal colitis. 10.  Aortoiliac atherosclerotic vascular disease. Electronically Signed  By: Van Clines M.D.   On: 12/27/2015 21:19   Ct Abdomen Pelvis W Contrast  12/16/2015  CLINICAL DATA:  Abdominal pain, nausea, vomiting and diarrhea. EXAM: CT ABDOMEN  AND PELVIS WITH CONTRAST TECHNIQUE: Multidetector CT imaging of the abdomen and pelvis was performed using the standard protocol following bolus administration of intravenous contrast. CONTRAST:  118m ISOVUE-300 IOPAMIDOL (ISOVUE-300) INJECTION 61% COMPARISON:  Abdominal series today as well as prior CT of the abdomen and pelvis on 11/14/2015. FINDINGS: Lower chest: The visualized lung bases show bibasilar atelectasis. No pleural fluid identified. Hepatobiliary: There again is evidence of cirrhosis with grossly nodular appearance of the liver. There is associated ascites surrounding the liver and also elsewhere in the peritoneal cavity. Overall volume of ascites is small to moderate. There is evidence of portal hypertension with distal esophageal and gastroesophageal varices identified supplied by the left gastric vein as well as probable supply from short gastric veins and a gastro-renal shunt emanating from the left renal vein. Pancreas: No mass, inflammatory changes, or other significant abnormality. Spleen: Within normal limits in size and appearance. Adrenals/Urinary Tract: No evidence of hydronephrosis. Nonobstructing calculus in the lower pole of the right kidney present measuring approximately 10 mm in greatest diameter. Stomach/Bowel: There is evidence of new colitis with significant circumferential thickening involving the ascending and transverse colon. Colonic thickening appears extends just into the proximal descending colon. No evidence of bowel perforation or focal abscess. No small bowel dilatation or obstruction. The stomach continues to show evidence of prominent folds at the level of the gastric cardia and GE junction extending into the body of the stomach. Prominence at the level of the gastric cardia is more pronounced compared to prior studies and it may help to correlate this finding with repeat EGD. Vascular/Lymphatic: No pathologically enlarged lymph nodes. No evidence of abdominal aortic  aneurysm. Reproductive: The uterus has been removed. Other: No hernias identified. Musculoskeletal: Bony structures show stable spondylosis at L5-S1 a mild leftward convex scoliosis of the lumbar spine. IMPRESSION: 1. New colitis involving the ascending and transverse colon. No evidence of bowel perforation, focal abscess or small bowel obstruction. 2. Cirrhosis and portal hypertension with distal esophageal and gastroesophageal varices identified supplied by probable left gastric and gastro-renal shunts. 3. Some increased prominence of folds at the level of the gastric cardia extending into the body of the stomach. Some of these changes were apparently secondary to prior fundoplication and gastric inflammation by prior EGD. Correlation with repeat EGD may be helpful, especially given the CT evidence of definite distal esophageal varices. Electronically Signed   By: GAletta EdouardM.D.   On: 12/16/2015 15:48   Dg Abd Acute W/chest  12/27/2015  CLINICAL DATA:  Upper abdominal pain for the past 4 days. History of atrial fibrillation. EXAM: DG ABDOMEN ACUTE W/ 1V CHEST COMPARISON:  12/17/2015; 12/16/2015 FINDINGS: Grossly unchanged enlarged cardiac silhouette and mediastinal contours given persistently reduced lung volumes. Minimal bilateral infrahilar opacities favored to represent atelectasis. Mild pulmonary venous congestion without frank evidence of edema. No pleural effusion or pneumothorax. Mild gas distention of multiple loops of small bowel without definitive evidence of enteric obstruction. Unremarkable colonic stool burden. No pneumoperitoneum, pneumatosis or portal venous gas. Indeterminate punctate (approximately 5 mm) opacity overlying the right mid abdomen. Several phleboliths overlie the lower pelvis bilaterally. Mild to moderate scoliotic curvature of the thoracolumbar spine with dominant caudal component convex to the left. No acute osseous abnormalities. IMPRESSION: 1. Cardiomegaly and pulmonary  venous congestion without definitive evidence  of acute cardiopulmonary disease on this hypoventilated examination. 2. Nonspecific mild gaseous distention of several loops of small bowel without definite evidence of enteric obstruction. 3. Indeterminate punctate (approximately 5 mm) opacity overlies the right mid abdomen, while potentially radiopaque ingested debris, potentially renal stone could have a similar appearance. Clinical correlation is advised. Further evaluation with noncontrast abdominal CT could be performed as indicated. Electronically Signed   By: Sandi Mariscal M.D.   On: 12/27/2015 18:12   Dg Abd Acute W/chest  12/16/2015  CLINICAL DATA:  60 year old female with abdominal pain nausea vomiting and diarrhea for 4 days. Initial encounter. EXAM: DG ABDOMEN ACUTE W/ 1V CHEST COMPARISON:  CT Abdomen and Pelvis 11/14/2015 and earlier. FINDINGS: Upright AP view of the chest. Improved lung volumes and resolved left lung base effusion and opacity. No pneumothorax or pneumoperitoneum. Normal cardiac size and mediastinal contours. No new pulmonary opacity. Moderate gaseous distension of the stomach. Mild paucity of bowel gas elsewhere, and somewhat featureless appearance of gas-filled loops. There is gas throughout nondilated descending and proximal sigmoid colon. S-shaped scoliosis. No acute osseous abnormality identified. Stable cholecystectomy clips. IMPRESSION: 1. No abdominal free air. Moderately distended stomach with nonobstructed bowel-gas pattern otherwise. The somewhat featureless appearance of bowel loops however might reflect ileus. 2. Resolved left pleural effusion and airspace disease since March. No acute cardiopulmonary abnormality. Electronically Signed   By: Genevie Ann M.D.   On: 12/16/2015 10:58   Dg Abd Portable 1v  12/28/2015  CLINICAL DATA:  Distended abdomen EXAM: PORTABLE ABDOMEN - 1 VIEW COMPARISON:  CT 12/27/2015 FINDINGS: Contrast in the right colon from prior CT. Gas in large and  small bowel compatible with ileus. Negative for bowel obstruction Contrast in urinary bladder from recent CT scan. Lumbar levoscoliosis. IMPRESSION: Mild ileus. Nonobstructive bowel gas pattern. Contrast in the right colon from CT yesterday. Electronically Signed   By: Franchot Gallo M.D.   On: 12/28/2015 09:37     Assessment and Plan   1. Syncope: -Occuring x 2 s/p recurrent episode of vomiting and diarrhea -It remains unclear at this time if her symptoms are entirely orthostatic hypotension in the setting of the above vs multifactorial including some component of tachy-brady syndrome  -She has previously been seen by EP in January 2017 and felt to not be a candidate for PPM -Given this most recent episode of syncope we will have EP see her on 4/20 to get further recommendations of possible PPM -Has not been on amiodarone at home, would continue to hold this medication -Hold metoprolol given the above, as well as 2/2 her soft BP and need to dopamine infusion  -Recommend she be seen by GI, possibly outpatient, regarding her persistent symptoms  2. Tachy-brady syndrome: -Currently in sinus tach with HR in the 120's, perhaps this is in the setting of her new leukocytosis of 13,000 -Would be cautious with AV nodal blocking agents given her history of tachybrady syndrome and recent hypotension  -This is likely a normal chronotropic response for her acute illness, continue to monitor  -No documented evidence of HR in the 30s bpm on telemetry (patient did have NSR with PACs followed by pause not felt to be clinically significant) -Lowest HR seen was 58 bpm -EP to see as above  3. PAF: -Currently in sinus rhythm -EP to see for possible PPM -Eliquis 5 mg bid -CHADS2VASc at least 5 (HTN, diabetes, stroke x 2, female)  4. Colitis with septic shock: -BP stable off dopamine  -Colitis seems to  be a recurring issue for her -Needs GI work up -Per IM/GI  5. Leukocytosis: -WBC up to 13,000 this  morning from 3,000 on 4/18 -Afebrile -She developed a productive cough with yellow sputum on 4/19 -Consider CXR, defer to IM -Started on Zosyn 4/19  6. History of stroke with residual left-sided weakness: -On Eliquis  -Patient's left-sided weakness is known  Melvern Banker, PA-C Pager: [phone] 12/29/2015, 7:25 AM

## 2015-12-30 ENCOUNTER — Inpatient Hospital Stay: Payer: Commercial Managed Care - HMO

## 2015-12-30 DIAGNOSIS — G459 Transient cerebral ischemic attack, unspecified: Secondary | ICD-10-CM

## 2015-12-30 LAB — CBC WITH DIFFERENTIAL/PLATELET
BASOS ABS: 0 10*3/uL (ref 0–0.1)
Basophils Relative: 0 %
Eosinophils Absolute: 0.1 10*3/uL (ref 0–0.7)
Eosinophils Relative: 1 %
HEMATOCRIT: 27.4 % — AB (ref 35.0–47.0)
Hemoglobin: 8.7 g/dL — ABNORMAL LOW (ref 12.0–16.0)
LYMPHS PCT: 13 %
Lymphs Abs: 0.7 10*3/uL — ABNORMAL LOW (ref 1.0–3.6)
MCH: 28.9 pg (ref 26.0–34.0)
MCHC: 31.9 g/dL — ABNORMAL LOW (ref 32.0–36.0)
MCV: 90.8 fL (ref 80.0–100.0)
MONO ABS: 0.3 10*3/uL (ref 0.2–0.9)
Monocytes Relative: 5 %
NEUTROS ABS: 4.4 10*3/uL (ref 1.4–6.5)
Neutrophils Relative %: 81 %
Platelets: 120 10*3/uL — ABNORMAL LOW (ref 150–440)
RBC: 3.02 MIL/uL — AB (ref 3.80–5.20)
RDW: 16.4 % — ABNORMAL HIGH (ref 11.5–14.5)
WBC: 5.5 10*3/uL (ref 3.6–11.0)

## 2015-12-30 LAB — BASIC METABOLIC PANEL
ANION GAP: 3 — AB (ref 5–15)
BUN: 9 mg/dL (ref 6–20)
CHLORIDE: 109 mmol/L (ref 101–111)
CO2: 26 mmol/L (ref 22–32)
Calcium: 8.6 mg/dL — ABNORMAL LOW (ref 8.9–10.3)
Creatinine, Ser: 0.68 mg/dL (ref 0.44–1.00)
GFR calc Af Amer: >60 mL/min (ref >60)
GFR calc non Af Amer: >60 mL/min (ref >60)
GLUCOSE: 144 mg/dL — AB (ref 65–99)
POTASSIUM: 4.5 mmol/L (ref 3.5–5.1)
Sodium: 138 mmol/L (ref 135–145)

## 2015-12-30 LAB — HEMOGLOBIN: Hemoglobin: 9.1 g/dL — ABNORMAL LOW (ref 12.0–16.0)

## 2015-12-30 LAB — GLUCOSE, CAPILLARY
GLUCOSE-CAPILLARY: 136 mg/dL — AB (ref 65–99)
Glucose-Capillary: 123 mg/dL — ABNORMAL HIGH (ref 65–99)
Glucose-Capillary: 113 mg/dL — ABNORMAL HIGH (ref 65–99)
Glucose-Capillary: 154 mg/dL — ABNORMAL HIGH (ref 65–99)

## 2015-12-30 LAB — IRON AND TIBC
Iron: 8 ug/dL — ABNORMAL LOW (ref 28–170)
SATURATION RATIOS: 2 % — AB (ref 10.4–31.8)
TIBC: 363 ug/dL (ref 250–450)
UIBC: 355 ug/dL

## 2015-12-30 LAB — FERRITIN: FERRITIN: 19 ng/mL (ref 11–307)

## 2015-12-30 MED ORDER — METOPROLOL TARTRATE 25 MG PO TABS
12.5000 mg | ORAL_TABLET | Freq: Two times a day (BID) | ORAL | Status: DC
  Administered 2015-12-30 – 2015-12-31 (×3): 12.5 mg via ORAL
  Filled 2015-12-30: qty 1
  Filled 2015-12-30: qty 2
  Filled 2015-12-30: qty 1

## 2015-12-30 NOTE — Progress Notes (Signed)
Ambulated around the nurses station and tolerated it well. A & o. Takes meds ok. Pt has no further concerns at this time.

## 2015-12-30 NOTE — Progress Notes (Signed)
Inpatient Diabetes Program Recommendations  AACE/ADA: New Consensus Statement on Inpatient Glycemic Control (2015)  Target Ranges:  Prepandial:   less than 140 mg/dL      Peak postprandial:   less than 180 mg/dL (1-2 hours)      Critically ill patients:  140 - 180 mg/dL   Review of Glycemic Control Results for [name], [name] (MRN [id number]) as of 12/30/2015 09:40  Ref. Range 12/29/2015 07:49 12/29/2015 13:10 12/29/2015 16:17 12/29/2015 21:05 12/30/2015 07:21  Glucose-Capillary Latest Ref Range: 65-99 mg/dL 132 (H) 174 (H) 84 150 (H) 136 (H)     Diabetes history: Type 2 x 10 years Outpatient Diabetes medications:Levemir 17 units q day, Novolog tid with meals Current orders for Inpatient glycemic control: Levemir 17 units QHS, Novolog 0-9 units TID with meals, Novolog 0-5 units QHS, Novolog 3 units TID with meals for meal coverage  Inpatient Diabetes Program Recommendations: Agree with current orders for diabetes management- blood sugars look ideal.  [name], RN, BA, MHA, CDE Diabetes Coordinator Inpatient Diabetes Program  [phone] (Team Pager) [phone] ([place]) 12/30/2015 9:41 AM

## 2015-12-30 NOTE — Progress Notes (Signed)
Patient: [name] / Admit Date: 12/27/2015 / Date of Encounter: 12/30/2015, 8:29 AM   Subjective:   She is no longer hypotensive. She is still in sinus tachycardia and complains of abdominal pain. She reported blood in the stool yesterday. Hemoglobin dropped to 8.7. No more bleeding today. She is suspected of having colitis. Echo showed EF 60-65%, normal wall motion, GR1DD, mild MR, left atrium was mildly dilated, RV systolic function was normal, PASP normal.   Review of Systems: Review of Systems  Constitutional: Positive for chills, weight loss and malaise/fatigue. Negative for fever and diaphoresis.  HENT: Positive for congestion.   Eyes: Negative for discharge and redness.  Respiratory: Positive for cough, sputum production, shortness of breath and wheezing. Negative for hemoptysis.        Yellow sputum  Cardiovascular: Positive for palpitations and leg swelling. Negative for chest pain, orthopnea, claudication and PND.  Gastrointestinal: Positive for abdominal pain and diarrhea. Negative for heartburn, nausea, vomiting, blood in stool and melena.  Musculoskeletal: Negative for myalgias and falls.  Skin: Negative for rash.  Neurological: Positive for weakness. Negative for dizziness, tingling, tremors, sensory change, speech change, focal weakness and loss of consciousness.  Endo/Heme/Allergies: Does not bruise/bleed easily.  Psychiatric/Behavioral: The patient is not nervous/anxious.     Objective: Telemetry: sinus tachycardia, 120's bpm Physical Exam: Blood pressure 136/75, pulse 96, temperature 98.1 F (36.7 C), temperature source Oral, resp. rate 20, height 5' 3"  (1.6 m), weight 148 lb 6.4 oz (67.314 kg), SpO2 99 %. Body mass index is 26.29 kg/(m^2). General: Well developed, well nourished, in no acute distress. Head: Normocephalic, atraumatic, sclera non-icteric, no xanthomas, nares are without discharge. Neck: Negative for carotid bruits. JVP not elevated. Lungs:  Clear bilaterally to auscultation without wheezes, rales, or rhonchi. Breathing is unlabored. Heart: Tachycardic, S1 S2 without murmurs, rubs, or gallops.  Abdomen: Soft, non-tender, non-distended with normoactive bowel sounds. No rebound/guarding. Extremities: No clubbing or cyanosis. Trace pre-tibial edema along bilateral LE.  Neuro: Alert and oriented X 3. Moves all extremities spontaneously. Psych:  Responds to questions appropriately with a normal affect.   Intake/Output Summary (Last 24 hours) at 12/30/15 0829 Last data filed at 12/30/15 0810  Gross per 24 hour  Intake    240 ml  Output   1300 ml  Net  -1060 ml    Inpatient Medications:  . apixaban  5 mg Oral BID  . atorvastatin  40 mg Oral q1800  . bisacodyl  10 mg Rectal Once  . citalopram  20 mg Oral Daily  . dicyclomine  10 mg Oral TID PC & HS  . insulin aspart  0-5 Units Subcutaneous QHS  . insulin aspart  0-9 Units Subcutaneous TID WC  . insulin aspart  3 Units Subcutaneous TID WC  . insulin detemir  17 Units Subcutaneous QHS  . lactulose  30 g Oral BID  . levothyroxine  100 mcg Oral QAC breakfast  . magnesium oxide  400 mg Oral BID  . metoCLOPramide  5 mg Oral TID AC & HS  . metoprolol tartrate  12.5 mg Oral BID  . mometasone-formoterol  2 puff Inhalation BID  . morphine  60 mg Oral TID  . pantoprazole  40 mg Oral BID AC  . piperacillin-tazobactam (ZOSYN)  IV  3.375 g Intravenous Q8H  . rifaximin  550 mg Oral BID   Infusions:     Labs:  Recent Labs  12/29/15 0419 12/30/15 0658  NA 137 138  K 3.3*  4.5  CL 110 109  CO2 22 26  GLUCOSE 185* 144*  BUN 13 9  CREATININE 0.56 0.68  CALCIUM 8.0* 8.6*    Recent Labs  12/27/15 1804 12/29/15 0419  AST 39 26  ALT 20 21  ALKPHOS 82 84  BILITOT 1.3* 0.7  PROT 5.7* 5.5*  ALBUMIN 2.9* 2.9*    Recent Labs  12/29/15 0419 12/30/15 0658  WBC 13.8* 5.5  NEUTROABS 12.5* 4.4  HGB 9.9* 8.7*  HCT 30.3* 27.4*  MCV 91.1 90.8  PLT 141* 120*    Recent  Labs  12/27/15 1804  TROPONINI 0.04*   Invalid input(s): POCBNP  Recent Labs  12/27/15 1709  HGBA1C 8.2*     Weights: Filed Weights   12/27/15 2302 12/28/15 2324  Weight: 140 lb 9.6 oz (63.776 kg) 148 lb 6.4 oz (67.314 kg)     Radiology/Studies:  Dg Chest 2 View  12/29/2015  CLINICAL DATA:  Shortness of breath.  Uterine cancer. EXAM: CHEST  2 VIEW COMPARISON:  12/27/2015 chest radiograph. FINDINGS: Low lung volumes. Stable cardiomediastinal silhouette with top-normal heart size. No pneumothorax. No pleural effusion. Stable mild-to-moderate elevation of the right hemidiaphragm. No pulmonary edema. Mild bibasilar atelectasis. No acute consolidative airspace disease. IMPRESSION: Low lung volumes. Stable elevation of the right hemidiaphragm with mild bibasilar atelectasis. Electronically Signed   By: Ilona Sorrel M.D.   On: 12/29/2015 12:30   Dg Chest 2 View  12/27/2015  CLINICAL DATA:  Weakness.  Cough. EXAM: CHEST  2 VIEW COMPARISON:  Chest from abdominal radiographs earlier this day at 1744 hour FINDINGS: Unchanged elevation of right hemidiaphragm. There is adjacent atelectasis or scarring at the lung bases. Cardiomediastinal contours are unchanged with stable cardiomegaly. Probable vascular congestion. No evidence pulmonary edema. Trace left pleural effusion IMPRESSION: Unchanged elevation of right hemidiaphragm. Unchanged cardiomegaly. No new abnormality is seen. Electronically Signed   By: Jeb Levering M.D.   On: 12/27/2015 23:50   Dg Chest 2 View  12/17/2015  CLINICAL DATA:  Recent onset of cough; admitted yesterday; recently hospitalized for pneumonia; hx/o cirrhosis, htn, paf, and cancer of uterus EXAM: CHEST  2 VIEW COMPARISON:  12/16/2015 FINDINGS: Since the previous day's study, areas of airspace opacity have developed, most evident in the right upper lobe, but also noted in the right lower lobe and left lower lobe. No pleural effusion or pneumothorax. Cardiac silhouette is  normal in size and configuration. No mediastinal or hilar masses or convincing adenopathy. Bony thorax is intact. IMPRESSION: 1. Multifocal pneumonia has become apparent since the previous day's study. Electronically Signed   By: Lajean Manes M.D.   On: 12/17/2015 12:23   Dg Abd 1 View  12/29/2015  CLINICAL DATA:  Ascending colitis with septic shock.  Ileus. EXAM: ABDOMEN - 1 VIEW COMPARISON:  None. FINDINGS: Gaseous distension of the colon identified. There is been interval increase in caliber of the large bowel loops with decrease and small bowel air. Enteric contrast material remains within the right colon. IMPRESSION: 1. Imaging findings compatible with colonic ileus. Electronically Signed   By: Kerby Moors M.D.   On: 12/29/2015 08:57   Ct Head Wo Contrast  12/27/2015  CLINICAL DATA:  Abdominal pain for 4 days, nausea, vomiting, diarrhea, onset of LEFT-sided weakness at 1430 hours, history TIA, atrial fibrillation, diabetes mellitus, paroxysmal atrial fibrillation, essential hypertension, uterine cancer EXAM: CT HEAD WITHOUT CONTRAST TECHNIQUE: Contiguous axial images were obtained from the base of the skull through the vertex without intravenous contrast. COMPARISON:  11/11/2015 FINDINGS: Mild atrophy. Normal ventricular morphology. No midline shift or mass effect. Otherwise normal appearance of brain parenchyma. No intracranial hemorrhage, mass lesion, or evidence acute infarction. No extra-axial fluid collections. Visualized paranasal sinuses and mastoid air cells clear. Mild hyperostosis frontalis interna. No acute osseous findings. IMPRESSION: No acute intracranial abnormalities. Findings called to Dr. Reita Cliche on 12/27/2015 at 1703 hours. Electronically Signed   By: Lavonia Dana M.D.   On: 12/27/2015 17:04   Ct Chest Wo Contrast  12/17/2015  CLINICAL DATA:  Diagnosed with pneumonia 1 month ago. Coughing and wheezing has increased over the last couple days. Shortness of breath today. EXAM: CT CHEST  WITHOUT CONTRAST TECHNIQUE: Multidetector CT imaging of the chest was performed following the standard protocol without IV contrast. COMPARISON:  Chest x-rays dated 12/17/2015 and 11/14/2015. FINDINGS: Mediastinum/Lymph Nodes: Heart size is upper normal. No pericardial effusion. Scattered coronary artery calcifications noted. No masses or enlarged lymph nodes seen within the mediastinum or perihilar regions. Lungs/Pleura: Patchy consolidations throughout both lungs, involving all lobes, most dense within the right upper lobe and at the left lung base posteriorly. Majority of the consolidations are associated with ground-glass opacities suggesting atypical pneumonia or associated edema. Trachea and central bronchi are unremarkable. Upper abdomen: Free ascites is seen within the upper abdomen. Liver appears cirrhotic. Patient is status post cholecystectomy. Thickening of the walls of the upper right colon appear similar to the appearance on CT abdomen of 12/16/2015 compatible with the description of colitis on the associated report. Musculoskeletal: Mild scoliosis of the thoracic spine. No evidence of acute osseous abnormality. Superficial soft tissues are unremarkable. IMPRESSION: 1. Multi lobar consolidations, most dense within the right upper lobe and at the left lung base posteriorly, with areas of associated ground-glass opacity suggesting atypical pneumonia and/or edema. Differential includes atypical pneumonias such as viral or fungal, interstitial pneumonias, edema related to volume overload/CHF, chronic interstitial diseases, hypersensitivity pneumonitis, and respiratory bronchiolitis. 2. Upper abdominal ascites, presumably associated with liver cirrhosis. Thickening of the walls of the upper ascending colon similar to its appearance on CT abdomen of 12/16/2015, compatible with the previous description of colitis on the earlier CT report. Electronically Signed   By: Franki Cabot M.D.   On: 12/17/2015 17:13     Mr Jodene Nam Head Wo Contrast  12/29/2015  CLINICAL DATA:  60 year old female with vomiting and dizziness for several days. Initial encounter. EXAM: MRI HEAD WITHOUT CONTRAST MRA HEAD WITHOUT CONTRAST TECHNIQUE: Multiplanar, multiecho pulse sequences of the brain and surrounding structures were obtained without intravenous contrast. Angiographic images of the head were obtained using MRA technique without contrast. COMPARISON:  Head CT without contrast 12/27/2015. Brain MRI 10/04/2015. FINDINGS: MRI HEAD FINDINGS Major intracranial vascular flow voids are stable. No restricted diffusion to suggest acute infarction. No midline shift, mass effect, evidence of mass lesion, ventriculomegaly, extra-axial collection or acute intracranial hemorrhage. Cervicomedullary junction and pituitary are within normal limits. Negative visualized cervical spine. Stable gray and white matter signal throughout the brain; scattered nonspecific cerebral white matter T2 and FLAIR hyperintensity. Visible internal auditory structures appear normal. Mild bilateral mastoid fluid is stable. Negative nasopharynx. Trace paranasal sinus mucosal thickening has not significantly changed. Orbit and scalp soft tissues are within normal limits. Normal bone marrow signal. MRA HEAD FINDINGS Antegrade flow in the posterior circulation with codominant distal vertebral arteries. Normal PICA origins and vertebrobasilar junction. Normal basilar artery, AICA origins, SCA origins, and left PCA origin. Fetal type right PCA origin. Left posterior communicating artery is diminutive or  absent. Bilateral PCA branches are within normal limits. Antegrade flow in both ICA siphons. No siphon stenosis. Normal ophthalmic and right posterior communicating artery origins. Normal carotid termini, MCA and ACA origins. Anterior communicating artery is diminutive. Visualized bilateral ACA and MCA branches are within normal limits. IMPRESSION: 1. No acute intracranial  abnormality. Stable noncontrast MRI appearance of the brain since January. 2.  Negative intracranial MRA. Electronically Signed   By: Genevie Ann M.D.   On: 12/29/2015 13:46   Mr Angiogram Neck W Wo Contrast  12/29/2015  CLINICAL DATA:  60 year old female with vomiting and dizziness for several days. Initial encounter. EXAM: MRA NECK WITHOUT AND WITH CONTRAST TECHNIQUE: Multiplanar and multiecho pulse sequences of the neck were obtained without and with intravenous contrast. Angiographic images of the neck were obtained using MRA technique without and with intravenous contrast. CONTRAST:  69m MULTIHANCE GADOBENATE DIMEGLUMINE 529 MG/ML IV SOLN COMPARISON:  Brain MRI and intracranial MRA from today reported separately. FINDINGS: Precontrast time-of-flight images reveal antegrade flow in both carotid and vertebral arteries throughout the neck. Carotid bifurcations appear normal. Vertebral arteries are codominant. Post-contrast neck MRA images reveal a 3 vessel arch configuration. Tortuous proximal great vessels but no great vessel origin stenosis. Tortuous proximal right CCA. Widely patent right carotid bifurcation. Negative cervical right ICA. Tortuous proximal left CCA. Mild irregularity at the left carotid bifurcation primarily affecting the ECA origin. Widely patent left ICA origin and bulb. Negative cervical left ICA. Normal vertebral artery origins. Mildly dominant right vertebral artery throughout the neck. No vertebral artery stenosis to the vertebrobasilar junction. IMPRESSION: Negative neck MRA aside from tortuosity of the great vessels. Electronically Signed   By: HGenevie AnnM.D.   On: 12/29/2015 14:01   Mr Brain Wo Contrast  12/29/2015  CLINICAL DATA:  60year old female with vomiting and dizziness for several days. Initial encounter. EXAM: MRI HEAD WITHOUT CONTRAST MRA HEAD WITHOUT CONTRAST TECHNIQUE: Multiplanar, multiecho pulse sequences of the brain and surrounding structures were obtained without  intravenous contrast. Angiographic images of the head were obtained using MRA technique without contrast. COMPARISON:  Head CT without contrast 12/27/2015. Brain MRI 10/04/2015. FINDINGS: MRI HEAD FINDINGS Major intracranial vascular flow voids are stable. No restricted diffusion to suggest acute infarction. No midline shift, mass effect, evidence of mass lesion, ventriculomegaly, extra-axial collection or acute intracranial hemorrhage. Cervicomedullary junction and pituitary are within normal limits. Negative visualized cervical spine. Stable gray and white matter signal throughout the brain; scattered nonspecific cerebral white matter T2 and FLAIR hyperintensity. Visible internal auditory structures appear normal. Mild bilateral mastoid fluid is stable. Negative nasopharynx. Trace paranasal sinus mucosal thickening has not significantly changed. Orbit and scalp soft tissues are within normal limits. Normal bone marrow signal. MRA HEAD FINDINGS Antegrade flow in the posterior circulation with codominant distal vertebral arteries. Normal PICA origins and vertebrobasilar junction. Normal basilar artery, AICA origins, SCA origins, and left PCA origin. Fetal type right PCA origin. Left posterior communicating artery is diminutive or absent. Bilateral PCA branches are within normal limits. Antegrade flow in both ICA siphons. No siphon stenosis. Normal ophthalmic and right posterior communicating artery origins. Normal carotid termini, MCA and ACA origins. Anterior communicating artery is diminutive. Visualized bilateral ACA and MCA branches are within normal limits. IMPRESSION: 1. No acute intracranial abnormality. Stable noncontrast MRI appearance of the brain since January. 2.  Negative intracranial MRA. Electronically Signed   By: HGenevie AnnM.D.   On: 12/29/2015 13:46   Ct Abdomen Pelvis W Contrast  12/27/2015  CLINICAL DATA:  Vomiting and dizziness. Abdominal pain. Weakness. Lightheadedness. Cirrhosis. EXAM: CT  ABDOMEN AND PELVIS WITH CONTRAST TECHNIQUE: Multidetector CT imaging of the abdomen and pelvis was performed using the standard protocol following bolus administration of intravenous contrast. CONTRAST:  159m ISOVUE-300 IOPAMIDOL (ISOVUE-300) INJECTION 61% COMPARISON:  Multiple exams, including 12/16/2015 CT scan FINDINGS: Lower chest: Mild interstitial accentuation in both lower lobes, similar to 12/16/2015. Cardiomegaly with left ventricular hypertrophy. Trace left pleural effusion. Trace pericardial effusion. Small uphill varices adjacent to the distal esophagus. Hepatobiliary: Advanced cirrhosis. No focal liver mass seen. Cholecystectomy. Pancreas: Unremarkable Spleen: Unremarkable Adrenals/Urinary Tract: Adrenal glands normal. Right kidney lower pole 9 mm calculus, not appreciably changed. Small hypodense lesions of the right kidney are statistically likely to be cysts but technically too small to characterize. No hydronephrosis, ureteral calculus, or bladder calculus seen. Stomach/Bowel: Wall thickening of the stomach along the gastric cardia, somewhat resembling a fundoplication, correlate with operative history. Several mildly dilated loops of jejunum in the left abdomen, without a transition point to suggest obstruction. Equivocal wall thickening in the ascending colon. Vascular/Lymphatic: Aortoiliac atherosclerotic vascular disease. Gastric varices. Splenorenal shunting. Portal vein and splenic vein patent. No pathologic adenopathy identified. Reproductive: Uterus absent.  Ovaries not well seen. Other: Considerable ascites, increased from the exam 11 days ago. Increased mesenteric and subcutaneous edema. Musculoskeletal: Healing fractures the right anterior fourth, fifth, sixth, seventh, and eighth ribs with associated rib sclerosis, not appreciably changed from 12/16/2015. IMPRESSION: 1. Compared to the exam from 11 days ago, there is worsening third spacing of fluid with diffuse mesenteric and  subcutaneous edema along with increase in ascites. 2. Cirrhosis. 3. Trace left pleural effusion and trace pericardial effusion. 4. Healing anterior fourth through eighth rib fractures on the right. 5. Cardiomegaly with left ventricular hypertrophy. 6. The uphill esophageal varices and gastric varices. Splenorenal shunting. 7. Nonobstructive right kidney lower pole 9 mm calculus. 8. Wall thickening along the gastric cardia, query prior fundoplication. 9. Equivocal wall thickening in the ascending colon, possibly from third spacing of fluid or mild proximal colitis. 10.  Aortoiliac atherosclerotic vascular disease. Electronically Signed   By: WVan ClinesM.D.   On: 12/27/2015 21:19   Ct Abdomen Pelvis W Contrast  12/16/2015  CLINICAL DATA:  Abdominal pain, nausea, vomiting and diarrhea. EXAM: CT ABDOMEN AND PELVIS WITH CONTRAST TECHNIQUE: Multidetector CT imaging of the abdomen and pelvis was performed using the standard protocol following bolus administration of intravenous contrast. CONTRAST:  1077mISOVUE-300 IOPAMIDOL (ISOVUE-300) INJECTION 61% COMPARISON:  Abdominal series today as well as prior CT of the abdomen and pelvis on 11/14/2015. FINDINGS: Lower chest: The visualized lung bases show bibasilar atelectasis. No pleural fluid identified. Hepatobiliary: There again is evidence of cirrhosis with grossly nodular appearance of the liver. There is associated ascites surrounding the liver and also elsewhere in the peritoneal cavity. Overall volume of ascites is small to moderate. There is evidence of portal hypertension with distal esophageal and gastroesophageal varices identified supplied by the left gastric vein as well as probable supply from short gastric veins and a gastro-renal shunt emanating from the left renal vein. Pancreas: No mass, inflammatory changes, or other significant abnormality. Spleen: Within normal limits in size and appearance. Adrenals/Urinary Tract: No evidence of hydronephrosis.  Nonobstructing calculus in the lower pole of the right kidney present measuring approximately 10 mm in greatest diameter. Stomach/Bowel: There is evidence of new colitis with significant circumferential thickening involving the ascending and transverse colon. Colonic thickening appears extends just into the  proximal descending colon. No evidence of bowel perforation or focal abscess. No small bowel dilatation or obstruction. The stomach continues to show evidence of prominent folds at the level of the gastric cardia and GE junction extending into the body of the stomach. Prominence at the level of the gastric cardia is more pronounced compared to prior studies and it may help to correlate this finding with repeat EGD. Vascular/Lymphatic: No pathologically enlarged lymph nodes. No evidence of abdominal aortic aneurysm. Reproductive: The uterus has been removed. Other: No hernias identified. Musculoskeletal: Bony structures show stable spondylosis at L5-S1 a mild leftward convex scoliosis of the lumbar spine. IMPRESSION: 1. New colitis involving the ascending and transverse colon. No evidence of bowel perforation, focal abscess or small bowel obstruction. 2. Cirrhosis and portal hypertension with distal esophageal and gastroesophageal varices identified supplied by probable left gastric and gastro-renal shunts. 3. Some increased prominence of folds at the level of the gastric cardia extending into the body of the stomach. Some of these changes were apparently secondary to prior fundoplication and gastric inflammation by prior EGD. Correlation with repeat EGD may be helpful, especially given the CT evidence of definite distal esophageal varices. Electronically Signed   By: Aletta Edouard M.D.   On: 12/16/2015 15:48   Dg Abd Acute W/chest  12/27/2015  CLINICAL DATA:  Upper abdominal pain for the past 4 days. History of atrial fibrillation. EXAM: DG ABDOMEN ACUTE W/ 1V CHEST COMPARISON:  12/17/2015; 12/16/2015  FINDINGS: Grossly unchanged enlarged cardiac silhouette and mediastinal contours given persistently reduced lung volumes. Minimal bilateral infrahilar opacities favored to represent atelectasis. Mild pulmonary venous congestion without frank evidence of edema. No pleural effusion or pneumothorax. Mild gas distention of multiple loops of small bowel without definitive evidence of enteric obstruction. Unremarkable colonic stool burden. No pneumoperitoneum, pneumatosis or portal venous gas. Indeterminate punctate (approximately 5 mm) opacity overlying the right mid abdomen. Several phleboliths overlie the lower pelvis bilaterally. Mild to moderate scoliotic curvature of the thoracolumbar spine with dominant caudal component convex to the left. No acute osseous abnormalities. IMPRESSION: 1. Cardiomegaly and pulmonary venous congestion without definitive evidence of acute cardiopulmonary disease on this hypoventilated examination. 2. Nonspecific mild gaseous distention of several loops of small bowel without definite evidence of enteric obstruction. 3. Indeterminate punctate (approximately 5 mm) opacity overlies the right mid abdomen, while potentially radiopaque ingested debris, potentially renal stone could have a similar appearance. Clinical correlation is advised. Further evaluation with noncontrast abdominal CT could be performed as indicated. Electronically Signed   By: Sandi Mariscal M.D.   On: 12/27/2015 18:12   Dg Abd Acute W/chest  12/16/2015  CLINICAL DATA:  60 year old female with abdominal pain nausea vomiting and diarrhea for 4 days. Initial encounter. EXAM: DG ABDOMEN ACUTE W/ 1V CHEST COMPARISON:  CT Abdomen and Pelvis 11/14/2015 and earlier. FINDINGS: Upright AP view of the chest. Improved lung volumes and resolved left lung base effusion and opacity. No pneumothorax or pneumoperitoneum. Normal cardiac size and mediastinal contours. No new pulmonary opacity. Moderate gaseous distension of the stomach.  Mild paucity of bowel gas elsewhere, and somewhat featureless appearance of gas-filled loops. There is gas throughout nondilated descending and proximal sigmoid colon. S-shaped scoliosis. No acute osseous abnormality identified. Stable cholecystectomy clips. IMPRESSION: 1. No abdominal free air. Moderately distended stomach with nonobstructed bowel-gas pattern otherwise. The somewhat featureless appearance of bowel loops however might reflect ileus. 2. Resolved left pleural effusion and airspace disease since March. No acute cardiopulmonary abnormality. Electronically Signed   By:  Genevie Ann M.D.   On: 12/16/2015 10:58   Dg Abd Portable 1v  12/28/2015  CLINICAL DATA:  Distended abdomen EXAM: PORTABLE ABDOMEN - 1 VIEW COMPARISON:  CT 12/27/2015 FINDINGS: Contrast in the right colon from prior CT. Gas in large and small bowel compatible with ileus. Negative for bowel obstruction Contrast in urinary bladder from recent CT scan. Lumbar levoscoliosis. IMPRESSION: Mild ileus. Nonobstructive bowel gas pattern. Contrast in the right colon from CT yesterday. Electronically Signed   By: Franchot Gallo M.D.   On: 12/28/2015 09:37     Assessment and Plan   1. Syncope: -Occuring x 2 s/p recurrent episode of vomiting and diarrhea - I suspect this was likely vasovagal in the setting of hypotension related to underlying infection.   2. PAF: -Currently in sinus tachycardia - Presumed to metoprolol 12.5 mg twice daily which can be increased to 25 mg twice daily. She is currently on anticoagulation with Eliquis. I discontinued aspirin.  3. Colitis with septic shock: - Shock resolved. - She reports blood in stool with drop in hemoglobin. Continue to monitor her hemoglobin and we might need to hold Eliquis temporarily.   4. History of stroke with residual left-sided weakness: -On Eliquis    Signed, Kathlyn Sacramento, MD   12/30/2015, 8:29 AM

## 2015-12-30 NOTE — Clinical Social Work Note (Signed)
RN CM consulted CSW in error for STR. PT assessed patient and documented patient ambulated 160 ft and that husband would be taking patient home.  Shela Leff MSW,LCSW [phone]

## 2015-12-30 NOTE — Progress Notes (Signed)
Pharmacy Antibiotic Note  [name] is a 60 y.o. female admitted on 12/27/2015 with colitis with septic shock.  Pharmacy has been consulted for Zosyn dosing.  Plan: Zosyn 3.375g IV q8h (4 hour infusion).  Height: 5' 3"  (160 cm) Weight: 148 lb 6.4 oz (67.314 kg) IBW/kg (Calculated) : 52.4  Temp (24hrs), Avg:98.3 F (36.8 C), Min:98.1 F (36.7 C), Max:98.5 F (36.9 C)   Recent Labs Lab 12/27/15 1709 12/27/15 1804 12/28/15 1339 12/28/15 1825 12/29/15 0419 12/30/15 0658  WBC 3.7  --   --   --  13.8* 5.5  CREATININE  --  0.62  --   --  0.56 0.68  LATICACIDVEN  --   --  2.4* 2.4*  --   --     Estimated Creatinine Clearance: 67.2 mL/min (by C-G formula based on Cr of 0.68).    Allergies  Allergen Reactions  . Hydrocodone Other (See Comments)    Pt states that this medication caused cirrhosis of the liver.    . Erythromycin Other (See Comments)    Reaction:  Fever   . Prednisone Other (See Comments)    Reaction:  Unknown   . Rosiglitazone Maleate Swelling  . Codeine Sulfate Rash  . Tetanus-Diphtheria Toxoids Td Rash and Other (See Comments)    Reaction:  Fever     Antimicrobials this admission: zosyn 4/19 >>  rifaximin  Dose adjustments this admission:   Microbiology results: Blood cx:  1 bottle with Staph species- likely contaminant C diff: negative 4/19 MRSA PCR: negative  Thank you for allowing pharmacy to be a part of this patient's care.  Levar Fayson A 12/30/2015 1:48 PM

## 2015-12-30 NOTE — Care Management Important Message (Signed)
Important Message  Patient Details  Name: [name] MRN: [id number] Date of Birth: [birth year]   Medicare Important Message Given:  Yes    [name] 12/30/2015, 10:53 AM

## 2015-12-30 NOTE — Care Management (Signed)
PT/OT recommending SNF. CSW consult pending. Barier to discharge is poor improving ileus. Nursing denies n/v/d.

## 2015-12-30 NOTE — Progress Notes (Signed)
Physical Therapy Treatment Patient Details Name: [name] MRN: [id number] DOB: Sep 02, 1956 Today's Date: 12/30/2015    History of Present Illness 60 yo F presented to ED with abdominal pain with N/V/D for 4 days. She also reported having acute L sided weakness with hx of CVAs and being worked up for possible new CVA.Marland Kitchen She is currently on enteric precautions for possible c-diff. PMH includes DM I, cancer, TIA, PAF, and cirrhosis of the liver not due to alcohol.    PT Comments    Pt ready for session this morning.  Bed mobility with rail. Reports ambulating to bathroom with nursing without difficulty.  Ambulated 160' with rolling walker and supervision around nursing station with good safety and no loss of balance.  Standing exercises at bedside upon return.  Pt pleased with progress and mobility.  Stated she anticipates discharge to home tomorrow with husband and feels comfortable with plan.   Follow Up Recommendations        Equipment Recommendations       Recommendations for Other Services       Precautions / Restrictions Precautions Precautions: Fall Restrictions Weight Bearing Restrictions: No    Mobility  Bed Mobility Overal bed mobility: Modified Independent Bed Mobility: Supine to Sit;Sit to Supine     Supine to sit: Supervision Sit to supine: Supervision      Transfers Overall transfer level: Modified independent Equipment used: Rolling walker (2 wheeled) Transfers: Sit to/from Stand Sit to Stand: Supervision         General transfer comment: cues for hand placement, postural correction  Ambulation/Gait Ambulation/Gait assistance: Supervision Ambulation Distance (Feet): 160 Feet Assistive device: Rolling walker (2 wheeled) Gait Pattern/deviations: Step-through pattern   Gait velocity interpretation: Below normal speed for age/gender     Stairs            Wheelchair Mobility    Modified Rankin (Stroke Patients Only)       Balance    Sitting-balance support: Feet supported Sitting balance-Leahy Scale: Good     Standing balance support: Bilateral upper extremity supported Standing balance-Leahy Scale: Fair                      Cognition Arousal/Alertness: Awake/alert Behavior During Therapy: WFL for tasks assessed/performed Overall Cognitive Status: Within Functional Limits for tasks assessed                      Exercises      General Comments        Pertinent Vitals/Pain Pain Assessment: No/denies pain    Home Living                      Prior Function            PT Goals (current goals can now be found in the care plan section) Acute Rehab PT Goals Patient Stated Goal: "go home" Progress towards PT goals: Progressing toward goals    Frequency       PT Plan      Co-evaluation             End of Session           Time: 6073-7106 PT Time Calculation (min) (ACUTE ONLY): 23 min  Charges:  $Gait Training: 23-37 mins                    G Codes:      Chesley Noon, PTA 12/30/2015,  10:50 AM

## 2015-12-30 NOTE — Progress Notes (Signed)
[facility] [place] NAME: [name]    MR#:  [id number]  DATE OF BIRTH:  [birth year]  SUBJECTIVE:  CHIEF COMPLAINT:   Chief Complaint  Patient presents with  . Code Stroke  . Abdominal Pain   No diarrhea today. Abdominal pain- chronic Staff on the floor noticed some blood while cleaning the patient. It's unclear if this was blood clots in the stool or urine. No melena. No vomiting.  REVIEW OF SYSTEMS:    Review of Systems  Constitutional: Positive for malaise/fatigue. Negative for fever and chills.  HENT: Negative for sore throat.   Eyes: Negative for blurred vision, double vision and pain.  Respiratory: Negative for cough, hemoptysis, shortness of breath and wheezing.   Cardiovascular: Negative for chest pain, palpitations, orthopnea and leg swelling.  Gastrointestinal: Negative for heartburn, nausea, vomiting, abdominal pain, diarrhea and constipation.  Genitourinary: Negative for dysuria and hematuria.  Musculoskeletal: Negative for back pain and joint pain.  Skin: Negative for rash.  Neurological: Positive for weakness. Negative for sensory change, speech change, focal weakness and headaches.  Endo/Heme/Allergies: Does not bruise/bleed easily.  Psychiatric/Behavioral: Negative for depression. The patient is not nervous/anxious.     DRUG ALLERGIES:   Allergies  Allergen Reactions  . Hydrocodone Other (See Comments)    Pt states that this medication caused cirrhosis of the liver.    . Erythromycin Other (See Comments)    Reaction:  Fever   . Prednisone Other (See Comments)    Reaction:  Unknown   . Rosiglitazone Maleate Swelling  . Codeine Sulfate Rash  . Tetanus-Diphtheria Toxoids Td Rash and Other (See Comments)    Reaction:  Fever     VITALS:  Blood pressure 136/75, pulse 96, temperature 98.1 F (36.7 C), temperature source Oral, resp. rate 20, height 5' 3"  (1.6 m), weight 67.314 kg (148 lb 6.4 oz), SpO2  99 %.  PHYSICAL EXAMINATION:   Physical Exam  GENERAL:  60 y.o.-year-old patient lying in the bed with no acute distress.  EYES: Pupils equal, round, reactive to light and accommodation. No scleral icterus. Extraocular muscles intact.  HEENT: Head atraumatic, normocephalic. Oropharynx and nasopharynx clear.  NECK:  Supple, no jugular venous distention. No thyroid enlargement, no tenderness.  LUNGS: Normal breath sounds bilaterally, no wheezing, rales, rhonchi. No use of accessory muscles of respiration.  CARDIOVASCULAR: S1, S2 tachycardic and irregular. No murmurs, rubs, or gallops.  ABDOMEN: Soft. Bowel sounds present. No organomegaly or mass. Tender and distended EXTREMITIES: No cyanosis, clubbing or edema b/l.    NEUROLOGIC: Cranial nerves II through XII are intact. Left upper and lower extremities 4/5. Right 5/5. Sensation decreased left. PSYCHIATRIC: The patient is alert and oriented x 3.  SKIN: No obvious rash, lesion, or ulcer.   LABORATORY PANEL:   CBC  Recent Labs Lab 12/30/15 0658  WBC 5.5  HGB 8.7*  HCT 27.4*  PLT 120*   ------------------------------------------------------------------------------------------------------------------ Chemistries   Recent Labs Lab 12/29/15 0419 12/30/15 0658  NA 137 138  K 3.3* 4.5  CL 110 109  CO2 22 26  GLUCOSE 185* 144*  BUN 13 9  CREATININE 0.56 0.68  CALCIUM 8.0* 8.6*  AST 26  --   ALT 21  --   ALKPHOS 84  --   BILITOT 0.7  --    ------------------------------------------------------------------------------------------------------------------  Cardiac Enzymes  Recent Labs Lab 12/27/15 1804  TROPONINI 0.04*   ------------------------------------------------------------------------------------------------------------------  RADIOLOGY:  Dg Chest 2 View  12/29/2015  CLINICAL  DATA:  Shortness of breath.  Uterine cancer. EXAM: CHEST  2 VIEW COMPARISON:  12/27/2015 chest radiograph. FINDINGS: Low lung volumes.  Stable cardiomediastinal silhouette with top-normal heart size. No pneumothorax. No pleural effusion. Stable mild-to-moderate elevation of the right hemidiaphragm. No pulmonary edema. Mild bibasilar atelectasis. No acute consolidative airspace disease. IMPRESSION: Low lung volumes. Stable elevation of the right hemidiaphragm with mild bibasilar atelectasis. Electronically Signed   By: Ilona Sorrel M.D.   On: 12/29/2015 12:30   Dg Abd 1 View  12/30/2015  CLINICAL DATA:  Ileus. EXAM: ABDOMEN - 1 VIEW COMPARISON:  12/29/2015. FINDINGS: Residual oral contrast and stool are seen in the ascending colon. Mild gaseous distention of remaining colon, minimally improved. Gas is seen in nondistended small bowel. IMPRESSION: Minimal improvement in colonic ileus. Electronically Signed   By: Lorin Picket M.D.   On: 12/30/2015 08:36   Dg Abd 1 View  12/29/2015  CLINICAL DATA:  Ascending colitis with septic shock.  Ileus. EXAM: ABDOMEN - 1 VIEW COMPARISON:  None. FINDINGS: Gaseous distension of the colon identified. There is been interval increase in caliber of the large bowel loops with decrease and small bowel air. Enteric contrast material remains within the right colon. IMPRESSION: 1. Imaging findings compatible with colonic ileus. Electronically Signed   By: Kerby Moors M.D.   On: 12/29/2015 08:57   Mr [name] Contrast  12/29/2015  CLINICAL DATA:  60 year old female with vomiting and dizziness for several days. Initial encounter. EXAM: MRI HEAD WITHOUT CONTRAST MRA HEAD WITHOUT CONTRAST TECHNIQUE: Multiplanar, multiecho pulse sequences of the brain and surrounding structures were obtained without intravenous contrast. Angiographic images of the head were obtained using MRA technique without contrast. COMPARISON:  Head CT without contrast 12/27/2015. Brain MRI 10/04/2015. FINDINGS: MRI HEAD FINDINGS Major intracranial vascular flow voids are stable. No restricted diffusion to suggest acute infarction. No  midline shift, mass effect, evidence of mass lesion, ventriculomegaly, extra-axial collection or acute intracranial hemorrhage. Cervicomedullary junction and pituitary are within normal limits. Negative visualized cervical spine. Stable gray and white matter signal throughout the brain; scattered nonspecific cerebral white matter T2 and FLAIR hyperintensity. Visible internal auditory structures appear normal. Mild bilateral mastoid fluid is stable. Negative nasopharynx. Trace paranasal sinus mucosal thickening has not significantly changed. Orbit and scalp soft tissues are within normal limits. Normal bone marrow signal. MRA HEAD FINDINGS Antegrade flow in the posterior circulation with codominant distal vertebral arteries. Normal PICA origins and vertebrobasilar junction. Normal basilar artery, AICA origins, SCA origins, and left PCA origin. Fetal type right PCA origin. Left posterior communicating artery is diminutive or absent. Bilateral PCA branches are within normal limits. Antegrade flow in both ICA siphons. No siphon stenosis. Normal ophthalmic and right posterior communicating artery origins. Normal carotid termini, MCA and ACA origins. Anterior communicating artery is diminutive. Visualized bilateral ACA and MCA branches are within normal limits. IMPRESSION: 1. No acute intracranial abnormality. Stable noncontrast MRI appearance of the brain since January. 2.  Negative intracranial MRA. Electronically Signed   By: Genevie Ann M.D.   On: 12/29/2015 13:46   Mr Angiogram Neck W Wo Contrast  12/29/2015  CLINICAL DATA:  60 year old female with vomiting and dizziness for several days. Initial encounter. EXAM: MRA NECK WITHOUT AND WITH CONTRAST TECHNIQUE: Multiplanar and multiecho pulse sequences of the neck were obtained without and with intravenous contrast. Angiographic images of the neck were obtained using MRA technique without and with intravenous contrast. CONTRAST:  34m MULTIHANCE GADOBENATE DIMEGLUMINE  529 MG/ML IV  SOLN COMPARISON:  Brain MRI and intracranial MRA from today reported separately. FINDINGS: Precontrast time-of-flight images reveal antegrade flow in both carotid and vertebral arteries throughout the neck. Carotid bifurcations appear normal. Vertebral arteries are codominant. Post-contrast neck MRA images reveal a 3 vessel arch configuration. Tortuous proximal great vessels but no great vessel origin stenosis. Tortuous proximal right CCA. Widely patent right carotid bifurcation. Negative cervical right ICA. Tortuous proximal left CCA. Mild irregularity at the left carotid bifurcation primarily affecting the ECA origin. Widely patent left ICA origin and bulb. Negative cervical left ICA. Normal vertebral artery origins. Mildly dominant right vertebral artery throughout the neck. No vertebral artery stenosis to the vertebrobasilar junction. IMPRESSION: Negative neck MRA aside from tortuosity of the great vessels. Electronically Signed   By: Genevie Ann M.D.   On: 12/29/2015 14:01   Mr Brain Wo Contrast  12/29/2015  CLINICAL DATA:  60 year old female with vomiting and dizziness for several days. Initial encounter. EXAM: MRI HEAD WITHOUT CONTRAST MRA HEAD WITHOUT CONTRAST TECHNIQUE: Multiplanar, multiecho pulse sequences of the brain and surrounding structures were obtained without intravenous contrast. Angiographic images of the head were obtained using MRA technique without contrast. COMPARISON:  Head CT without contrast 12/27/2015. Brain MRI 10/04/2015. FINDINGS: MRI HEAD FINDINGS Major intracranial vascular flow voids are stable. No restricted diffusion to suggest acute infarction. No midline shift, mass effect, evidence of mass lesion, ventriculomegaly, extra-axial collection or acute intracranial hemorrhage. Cervicomedullary junction and pituitary are within normal limits. Negative visualized cervical spine. Stable gray and white matter signal throughout the brain; scattered nonspecific cerebral white  matter T2 and FLAIR hyperintensity. Visible internal auditory structures appear normal. Mild bilateral mastoid fluid is stable. Negative nasopharynx. Trace paranasal sinus mucosal thickening has not significantly changed. Orbit and scalp soft tissues are within normal limits. Normal bone marrow signal. MRA HEAD FINDINGS Antegrade flow in the posterior circulation with codominant distal vertebral arteries. Normal PICA origins and vertebrobasilar junction. Normal basilar artery, AICA origins, SCA origins, and left PCA origin. Fetal type right PCA origin. Left posterior communicating artery is diminutive or absent. Bilateral PCA branches are within normal limits. Antegrade flow in both ICA siphons. No siphon stenosis. Normal ophthalmic and right posterior communicating artery origins. Normal carotid termini, MCA and ACA origins. Anterior communicating artery is diminutive. Visualized bilateral ACA and MCA branches are within normal limits. IMPRESSION: 1. No acute intracranial abnormality. Stable noncontrast MRI appearance of the brain since January. 2.  Negative intracranial MRA. Electronically Signed   By: Genevie Ann M.D.   On: 12/29/2015 13:46     ASSESSMENT AND PLAN:   60 year old female being admitted for possible acute stroke and abdominal pain  * Ascending colitis with septic shock Septic shock has resolved. On IV Zosyn. No further diarrhea. Continue antibiotics. Blood in stool likely from the colitis. We will hold Eliquis. If continues to have blood or significant drop in hemoglobin will need to consult GI.  * Tachycardia Bradycardia with Afib HR improved and now tachycardic Patient has tachybradycardia syndrome. Dr. Caryl Comes with electrophysiology to see today. Appreciate cardiology input  * Left weakness - MRI and MRA of the brain are normal. - CT Head is negative in the emergency room - Aspirin and statin. - Echo showed no thrombus. Patient seems to have functional weakness. In addition  her left upper extremity strength seems to be 3 on 5 but she is able to lift her arm up normally and hold onto things without any problem with that arm. - Requested neurology  consult.  * Chronic pain syndrome Resume home medication  * Diabetes: - Continue home dose of insulin.  - sliding scale insulin  * Ileus Likely constipation. Patient is being restarted on lactulose from home. No nausea or vomiting. Tolerating diet  * Cirrhosis Stable  All the records are reviewed and case discussed with Care Management/Social Workerr. Management plans discussed with the patient, family and they are in agreement.  CODE STATUS: DNR  DVT Prophylaxis: SCDs  TOTAL TIME TAKING CARE OF THIS PATIENT: 35 minutes.   POSSIBLE D/C IN 1-2 DAYS, DEPENDING ON CLINICAL CONDITION.  Hillary Bow R M.D on 12/30/2015 at 11:07 AM  Between 7am to 6pm - Pager - [phone]  After 6pm go to [email or web address] - password [email or web address] [facility]  Office  [phone]  CC: Primary care physician; [place] Pink, MD  Note: This dictation was prepared with Dragon dictation along with smaller phrase technology. Any transcriptional errors that result from this process are unintentional.

## 2015-12-30 NOTE — Progress Notes (Signed)
BCID was called in as 1/4 bottles growing staph species with MECa gene. Spoke with MD Sudini on 4/20 and we both agreed likely a contaminate; therefore no need to treat.  Larene Beach, PharmD

## 2015-12-30 NOTE — Progress Notes (Signed)
Occupational Therapy Treatment Patient Details Name: GENA LASKI MRN: [id number] DOB: 1956/02/06 Today's Date: 12/30/2015    History of present illness 60 yo F presented to ED with abdominal pain with N/V/D for 4 days. She also reported having acute L sided weakness with hx of CVAs and being worked up for possible new CVA.Marland Kitchen She is currently on enteric precautions for possible c-diff. PMH includes DM I, cancer, TIA, PAF, and cirrhosis of the liver not due to alcohol.   OT comments  Pt. was provided with a rocker knife, and was able to demonstrate proper use with cues, and visual demonstration. Pt. Was provided with an ADL catalog. Pt. did decline attempt for UE ther. Ex. This evening. Pt. continues to benefit from skilled OT services for A/E training, strengthening, and to improve overall ADL and IADL functioning. Pt. Reports possible discharge tomorrow.   Follow Up Recommendations  SNF    Equipment Recommendations  3 in 1 bedside comode    Recommendations for Other Services      Precautions / Restrictions Precautions Precautions: Fall Restrictions Weight Bearing Restrictions: No       Mobility Bed Mobility      Pt. Seen at bedside for tx session.            Transfers                      Balance                                   ADL   Eating/Feeding: Set up Eating/Feeding Details (indicate cue type and reason): Pt. was provided with an ADL catalog, as well as various other options for ordering equipment. After visual demonstration, pt. was able to use the rocker knife independently to cut a hamburger.                                          Vision                     Perception     Praxis      Cognition   Behavior During Therapy: WFL for tasks assessed/performed Overall Cognitive Status: Within Functional Limits for tasks assessed                       Extremity/Trunk Assessment                Exercises     Shoulder Instructions       General Comments      Pertinent Vitals/ Pain       Pain Assessment: No/denies pain Pain Score: 0-No pain  Home Living                                          Prior Functioning/Environment              Frequency Min 1X/week     Progress Toward Goals  OT Goals(current goals can now be found in the care plan section)     Acute Rehab OT Goals Patient Stated Goal: "go home" OT Goal Formulation: With patient Time For Goal Achievement: 01/11/16 Potential to Achieve Goals:  Good  Plan Discharge plan remains appropriate    Co-evaluation                 End of Session Equipment Utilized During Treatment:  (Rocker Knife)   Activity Tolerance Patient limited by fatigue;Patient tolerated treatment well   Patient Left     Nurse Communication          Time: 7673-4193 OT Time Calculation (min): 15 min  Charges: OT General Charges $OT Visit: 1 Procedure OT Treatments $Self Care/Home Management : 8-22 mins   Harrel Carina, MS, OTR/L   Harrel Carina 12/30/2015, 5:42 PM

## 2015-12-30 NOTE — Care Management Note (Signed)
Case Management Note  Patient Details  Name: [name] MRN: [id number] Date of Birth: [birth year]  Subjective/Objective:   Spoke with patient regarding discharge planning. She is open to home health services. Prefers [facility]. Would benefit from SN and PT.  Referral to [place] at [facility]. Patient lives at home with her husband. She has a cane and a walker. Denies issues accessing medical care, copays or transportation.  PCP is Dr. Kary Kos.               Action/Plan: Dc 1-2 days. Need home health order for SN and PT.   Expected Discharge Date:                  Expected Discharge Plan:  [place]  In-House Referral:     Discharge planning Services  CM Consult  Post Acute Care Choice:  Home Health Choice offered to:  Patient  DME Arranged:    DME Agency:     HH Arranged:  RN, PT [place] Agency:  [facility]  Status of Service:  In process, will continue to follow  Medicare Important Message Given:  Yes Date Medicare IM Given:    Medicare IM give by:    Date Additional Medicare IM Given:    Additional Medicare Important Message give by:     If discussed at [place] of Stay Meetings, dates discussed:    Additional Comments:  Jolly Mango, RN 12/30/2015, 3:31 PM

## 2015-12-30 NOTE — Consult Note (Signed)
Referring Physician: Sudini    Chief Complaint: Left sided weakness  HPI: [name] is an 60 y.o. female who presented with the complaint of vomiting and dizziness for the previous couple of days. Patient reports awakening on the day of admission around 3 AM and felt weak and dizzy and fell to her knees to the left side. Then she got up the rest of the day, but around 2PM she walked to the kitchen and felt lightheaded and then passed out. When she stood back up she felt her left arm and left leg were weak and tingling. Patient presented to the ED and a code stroke was called.  TPA was not administered.  This weakness continues.  Initial NIHSS of 3.  Date last known well: 12/27/2015 Time last known well: Time: 14:00 tPA Given: No: Patient on anticoagulation  Past Medical History  Diagnosis Date  . Type 1 diabetes ([place])   . Hypothyroid   . Degenerative disk disease   . Stomach ulcer   . Diverticulitis   . Syncope 01/2015  . Anxiety   . GERD (gastroesophageal reflux disease)   . History of hiatal hernia   . Cancer (HCC)     HX OF CANCER OF UTERUS   . TIA (transient ischemic attack) 02/2015  . Hypertension   . Pancreatitis   . PAF (paroxysmal atrial fibrillation) ([place]) 03/2015    a. new onset 03/2015 in setting of intractable N/V; b. on Eliquis 5 mg bid; c. CHADSVASc 4 (DM, TIA x 2, female)  . Intussusception intestine ([place]) 05/2015  . Stroke ([place])   . Allergy   . Cirrhosis of liver not due to alcohol ([place]) 2014-11-27    Past Surgical History  Procedure Laterality Date  . Hernia repair    . Abdominal hysterectomy    . Cholecystectomy    . Esophagogastroduodenoscopy N/A 04/04/2015    Procedure: ESOPHAGOGASTRODUODENOSCOPY (EGD);  Surgeon: Hulen Luster, MD;  Location[facility] ENDOSCOPY;  Service: Endoscopy;  Laterality: N/A;    Family History  Problem Relation Age of Onset  . Hypertension Mother   . CAD Sister   . Heart attack Sister     Deceased 2014/11/27  . CAD Brother    Social  History:  reports that she has never smoked. She has never used smokeless tobacco. She reports that she does not drink alcohol or use illicit drugs.  Allergies:  Allergies  Allergen Reactions  . Hydrocodone Other (See Comments)    Pt states that this medication caused cirrhosis of the liver.    . Erythromycin Other (See Comments)    Reaction:  Fever   . Prednisone Other (See Comments)    Reaction:  Unknown   . Rosiglitazone Maleate Swelling  . Codeine Sulfate Rash  . Tetanus-Diphtheria Toxoids Td Rash and Other (See Comments)    Reaction:  Fever     Medications:  I have reviewed the patient's current medications. Prior to Admission:  Prescriptions prior to admission  Medication Sig Dispense Refill Last Dose  . albuterol (PROVENTIL HFA;VENTOLIN HFA) 108 (90 Base) MCG/ACT inhaler Inhale 2 puffs into the lungs every 6 (six) hours as needed for wheezing or shortness of breath. 1 Inhaler 2 PRN at PRN  . amLODipine (NORVASC) 5 MG tablet Take 5 mg by mouth daily.   unknown at unknown   . apixaban (ELIQUIS) 5 MG TABS tablet Take 1 tablet (5 mg total) by mouth 2 (two) times daily. 60 tablet 0 unknown at unknown   .  benzonatate (TESSALON) 200 MG capsule Take 1 capsule (200 mg total) by mouth 3 (three) times daily as needed for cough. 20 capsule 0 PRN at PRN  . budesonide-formoterol (SYMBICORT) 160-4.5 MCG/ACT inhaler Inhale 2 puffs into the lungs 2 (two) times daily. 1 Inhaler 12 unknown at unknown   . citalopram (CELEXA) 20 MG tablet Take 20 mg by mouth daily.   unknown at unknown  . dicyclomine (BENTYL) 10 MG capsule Take 10 mg by mouth 4 (four) times daily - after meals and at bedtime.    unknown at unknown   . insulin aspart (NOVOLOG) 100 UNIT/ML injection Inject into the skin 3 (three) times daily with meals as needed for high blood sugar. Pt uses as needed per sliding scale.   PRN at PRN  . insulin detemir (LEVEMIR) 100 UNIT/ML injection Inject 17 Units into the skin at bedtime.   unknown  at unknown   . levothyroxine (SYNTHROID, LEVOTHROID) 75 MCG tablet Take 1 tablet (75 mcg total) by mouth daily before breakfast. 30 tablet 1 unknown at unknown   . lisinopril (PRINIVIL,ZESTRIL) 5 MG tablet Take 5 mg by mouth daily.   unknown at unknown   . magnesium oxide (MAG-OX) 400 (241.3 Mg) MG tablet Take 1 tablet (400 mg total) by mouth 2 (two) times daily. 10 tablet 0 unknown at unknown   . metoCLOPramide (REGLAN) 5 MG tablet Take 1 tablet (5 mg total) by mouth 4 (four) times daily -  before meals and at bedtime. 90 tablet 0 unknown at unknown   . metoprolol tartrate (LOPRESSOR) 25 MG tablet Take 25 mg by mouth 2 (two) times daily.    unknown at unknown   . morphine (MS CONTIN) 15 MG 12 hr tablet Take 1 tablet (15 mg total) by mouth every 8 (eight) hours. 75 tablet 0 unknown at unknown   . oxyCODONE (OXY IR/ROXICODONE) 5 MG immediate release tablet Take 5-10 mg by mouth every 6 (six) hours as needed for severe pain.   PRN at PRN  . pantoprazole (PROTONIX) 40 MG tablet Take 40 mg by mouth 2 (two) times daily before a meal.   unknown at unknown   . predniSONE (DELTASONE) 10 MG tablet Take 10-50 mg by mouth daily with breakfast. Pt is to take as a taper:  5 tablets for three days, 4 tablets for three days, 3 tablets for three days, 2 tablets for three days, 1 tablet for three days, then stop.   unknown at unknown   . rifaximin (XIFAXAN) 550 MG TABS tablet Take 1 tablet (550 mg total) by mouth 2 (two) times daily. 60 tablet 6 unknown at unknown   . tiZANidine (ZANAFLEX) 4 MG tablet Take 4 mg by mouth 3 (three) times daily as needed for muscle spasms.    PRN at PRN  . metroNIDAZOLE (FLAGYL) 500 MG tablet Take 1 tablet (500 mg total) by mouth 3 (three) times daily. X 7 more days (Patient not taking: Reported on 12/27/2015) 21 tablet 0    Scheduled: . atorvastatin  40 mg Oral q1800  . bisacodyl  10 mg Rectal Once  . citalopram  20 mg Oral Daily  . dicyclomine  10 mg Oral TID PC & HS  . insulin  aspart  0-5 Units Subcutaneous QHS  . insulin aspart  0-9 Units Subcutaneous TID WC  . insulin aspart  3 Units Subcutaneous TID WC  . insulin detemir  17 Units Subcutaneous QHS  . lactulose  30 g Oral BID  .  levothyroxine  100 mcg Oral QAC breakfast  . magnesium oxide  400 mg Oral BID  . metoCLOPramide  5 mg Oral TID AC & HS  . metoprolol tartrate  12.5 mg Oral BID  . mometasone-formoterol  2 puff Inhalation BID  . morphine  60 mg Oral TID  . pantoprazole  40 mg Oral BID AC  . piperacillin-tazobactam (ZOSYN)  IV  3.375 g Intravenous Q8H  . rifaximin  550 mg Oral BID    ROS: History obtained from the patient  General ROS: negative for - chills, fatigue, fever, night sweats, weight gain or weight loss Psychological ROS: negative for - behavioral disorder, hallucinations, memory difficulties, mood swings or suicidal ideation Ophthalmic ROS: negative for - blurry vision, double vision, eye pain or loss of vision ENT ROS: as noted in HPI Allergy and Immunology ROS: negative for - hives or itchy/watery eyes Hematological and Lymphatic ROS: negative for - bleeding problems, bruising or swollen lymph nodes Endocrine ROS: negative for - galactorrhea, hair pattern changes, polydipsia/polyuria or temperature intolerance Respiratory ROS: negative for - cough, hemoptysis, shortness of breath or wheezing Cardiovascular ROS: negative for - chest pain, dyspnea on exertion, edema or irregular heartbeat Gastrointestinal ROS: as noted in HPI Genito-Urinary ROS: negative for - dysuria, hematuria, incontinence or urinary frequency/urgency Musculoskeletal ROS: negative for - joint swelling or muscular weakness Neurological ROS: as noted in HPI Dermatological ROS: negative for rash and skin lesion changes  Physical Examination: Blood pressure 136/75, pulse 96, temperature 98.1 F (36.7 C), temperature source Oral, resp. rate 20, height 5' 3"  (1.6 m), weight 67.314 kg (148 lb 6.4 oz), SpO2 99  %.  HEENT-  Normocephalic, no lesions, without obvious abnormality.  Normal external eye and conjunctiva.  Normal TM's bilaterally.  Normal auditory canals and external ears. Normal external nose, mucus membranes and septum.  Normal pharynx. Cardiovascular- S1, S2 normal, pulses palpable throughout   Lungs- chest clear, no wheezing, rales, normal symmetric air entry Abdomen- soft, non-tender; bowel sounds normal; no masses,  no organomegaly Extremities- no edema Lymph-no adenopathy palpable Musculoskeletal-no joint tenderness, deformity or swelling Skin-warm and dry, no hyperpigmentation, vitiligo, or suspicious lesions  Neurological Examination Mental Status: Alert, oriented, thought content appropriate.  Speech fluent without evidence of aphasia.  Able to follow 3 step commands without difficulty. Cranial Nerves: II: Discs flat bilaterally; Visual fields grossly normal, pupils equal, round, reactive to light and accommodation III,IV, VI: ptosis not present, extra-ocular motions intact bilaterally V,VII: smile symmetric, facial light touch sensation decreased on the left VIII: hearing normal bilaterally IX,X: gag reflex present XI: bilateral shoulder shrug XII: midline tongue extension Motor: Right : Upper extremity   5/5    Left:     Upper extremity   5/5, no drift  Lower extremity   5/5     Lower extremity   4+ to 5-/5 with decreased effort Tone and bulk:normal tone throughout; no atrophy noted Sensory: Pinprick and light touch decreased on the left Deep Tendon Reflexes: 2+ and symmetric with absent AJ's bilaterally Plantars: Right: upgoing   Left: upgoing Cerebellar: Normal finger-to-nose and normal heel-to-shin testing bilaterally Gait: not tested due to safety concerns   Laboratory Studies:  Basic Metabolic Panel:  Recent Labs Lab 12/27/15 1804 12/29/15 0419 12/30/15 0658  NA 137 137 138  K 4.0 3.3* 4.5  CL 108 110 109  CO2 24 22 26   GLUCOSE 233* 185* 144*  BUN 9  13 9   CREATININE 0.62 0.56 0.68  CALCIUM 8.2* 8.0* 8.6*  Liver Function Tests:  Recent Labs Lab 12/27/15 1804 12/29/15 0419  AST 39 26  ALT 20 21  ALKPHOS 82 84  BILITOT 1.3* 0.7  PROT 5.7* 5.5*  ALBUMIN 2.9* 2.9*   No results for input(s): LIPASE, AMYLASE in the last 168 hours. No results for input(s): AMMONIA in the last 168 hours.  CBC:  Recent Labs Lab 12/27/15 1709 12/29/15 0419 12/30/15 0658  WBC 3.7 13.8* 5.5  NEUTROABS 2.7 12.5* 4.4  HGB 10.7* 9.9* 8.7*  HCT 32.6* 30.3* 27.4*  MCV 90.1 91.1 90.8  PLT 152 141* 120*    Cardiac Enzymes:  Recent Labs Lab 12/27/15 1804  TROPONINI 0.04*    BNP: Invalid input(s): POCBNP  CBG:  Recent Labs Lab 12/29/15 0749 12/29/15 1310 12/29/15 1617 12/29/15 2105 12/30/15 0721  GLUCAP 132* 174* 44 150* 136*    Microbiology: Results for orders placed or performed during the hospital encounter of 12/27/15  MRSA PCR Screening     Status: None   Collection Time: 12/28/15  5:28 AM  Result Value Ref Range Status   MRSA by PCR NEGATIVE NEGATIVE Final    Comment:        The GeneXpert MRSA Assay (FDA approved for NASAL specimens only), is one component of a comprehensive MRSA colonization surveillance program. It is not intended to diagnose MRSA infection nor to guide or monitor treatment for MRSA infections.   CULTURE, BLOOD (ROUTINE X 2) w Reflex to PCR ID Panel     Status: Abnormal (Preliminary result)   Collection Time: 12/28/15 10:01 AM  Result Value Ref Range Status   Specimen Description BLOOD LEFT HAND  Final   Special Requests BOTTLES DRAWN AEROBIC AND ANAEROBIC [place]  Final   Culture  Setup Time   Final    GRAM POSITIVE COCCI AEROBIC BOTTLE ONLY CRITICAL RESULT CALLED TO, READ BACK BY AND VERIFIED WITH: HENRY ZOMPA 12/29/15 1156 MLM Organism ID to follow    Culture (A)  Final    STAPHYLOCOCCUS SPECIES AEROBIC BOTTLE ONLY Results consistent with contamination.    Report Status  PENDING  Incomplete  CULTURE, BLOOD (ROUTINE X 2) w Reflex to PCR ID Panel     Status: None (Preliminary result)   Collection Time: 12/28/15 10:01 AM  Result Value Ref Range Status   Specimen Description BLOOD LEFT ARM  Final   Special Requests BOTTLES DRAWN AEROBIC AND ANAEROBIC [place]  Final   Culture NO GROWTH 2 DAYS  Final   Report Status PENDING  Incomplete  Blood Culture ID Panel (Reflexed)     Status: Abnormal   Collection Time: 12/28/15 10:01 AM  Result Value Ref Range Status   Enterococcus species NOT DETECTED NOT DETECTED Final   Vancomycin resistance NOT DETECTED NOT DETECTED Final   Listeria monocytogenes NOT DETECTED NOT DETECTED Final   Staphylococcus species DETECTED (A) NOT DETECTED Final    Comment: CRITICAL RESULT CALLED TO, READ BACK BY AND VERIFIED WITH: HENRY ZOMPA 12/29/15 1156 MLM    Staphylococcus aureus NOT DETECTED NOT DETECTED Final   Methicillin resistance DETECTED (A) NOT DETECTED Final    Comment: CRITICAL RESULT CALLED TO, READ BACK BY AND VERIFIED WITH: HENRY ZOMPA 12/29/15 1156 MLM    Streptococcus species NOT DETECTED NOT DETECTED Final   Streptococcus agalactiae NOT DETECTED NOT DETECTED Final   Streptococcus pneumoniae NOT DETECTED NOT DETECTED Final   Streptococcus pyogenes NOT DETECTED NOT DETECTED Final   Acinetobacter baumannii NOT DETECTED NOT DETECTED Final   Enterobacteriaceae species NOT DETECTED  NOT DETECTED Final   Enterobacter cloacae complex NOT DETECTED NOT DETECTED Final   Escherichia coli NOT DETECTED NOT DETECTED Final   Klebsiella oxytoca NOT DETECTED NOT DETECTED Final   Klebsiella pneumoniae NOT DETECTED NOT DETECTED Final   Proteus species NOT DETECTED NOT DETECTED Final   Serratia marcescens NOT DETECTED NOT DETECTED Final   Carbapenem resistance NOT DETECTED NOT DETECTED Final   Haemophilus influenzae NOT DETECTED NOT DETECTED Final   Neisseria meningitidis NOT DETECTED NOT DETECTED Final   Pseudomonas aeruginosa  NOT DETECTED NOT DETECTED Final   Candida albicans NOT DETECTED NOT DETECTED Final   Candida glabrata NOT DETECTED NOT DETECTED Final   Candida krusei NOT DETECTED NOT DETECTED Final   Candida parapsilosis NOT DETECTED NOT DETECTED Final   Candida tropicalis NOT DETECTED NOT DETECTED Final  C difficile quick scan w PCR reflex     Status: None   Collection Time: 12/29/15 10:15 AM  Result Value Ref Range Status   C Diff antigen NEGATIVE NEGATIVE Final   C Diff toxin NEGATIVE NEGATIVE Final   C Diff interpretation Negative for C. difficile  Final    Coagulation Studies:  Recent Labs  12/27/15 1804  LABPROT 17.0*  INR 1.37    Urinalysis:  Recent Labs Lab 12/27/15 1709  COLORURINE AMBER*  LABSPEC 1.018  PHURINE 6.0  GLUCOSEU 50*  HGBUR 1+*  BILIRUBINUR NEGATIVE  KETONESUR NEGATIVE  PROTEINUR 30*  NITRITE NEGATIVE  LEUKOCYTESUR TRACE*    Lipid Panel:    Component Value Date/Time   CHOL 181 12/28/2015 0741   CHOL 182 11/12/2011 0325   TRIG 106 12/28/2015 0741   TRIG 149 11/12/2011 0325   HDL 55 12/28/2015 0741   HDL 23* 11/12/2011 0325   CHOLHDL 3.3 12/28/2015 0741   VLDL 21 12/28/2015 0741   VLDL 30 11/12/2011 0325   LDLCALC 105* 12/28/2015 0741   LDLCALC 129* 11/12/2011 0325    HgbA1C:  Lab Results  Component Value Date   HGBA1C 8.2* 12/27/2015    Urine Drug Screen:     Component Value Date/Time   LABOPIA NONE DETECTED 12/27/2015 1709   LABOPIA POSITIVE* 11/02/2007 0055   COCAINSCRNUR NONE DETECTED 12/27/2015 1709   COCAINSCRNUR NONE DETECTED 11/02/2007 0055   LABBENZ NONE DETECTED 12/27/2015 1709   LABBENZ NONE DETECTED 11/02/2007 0055   AMPHETMU NONE DETECTED 12/27/2015 1709   AMPHETMU NONE DETECTED 11/02/2007 0055   THCU NONE DETECTED 12/27/2015 1709   THCU NONE DETECTED 11/02/2007 0055   LABBARB NONE DETECTED 12/27/2015 1709   LABBARB  11/02/2007 0055    NONE DETECTED        DRUG SCREEN FOR MEDICAL PURPOSES ONLY.  IF CONFIRMATION IS  NEEDED FOR ANY PURPOSE, NOTIFY LAB WITHIN 5 DAYS.    Alcohol Level: No results for input(s): ETH in the last 168 hours.  Other results: EKG: ventricular paced complexes at 110 bpm.  Imaging: Dg Chest 2 View  12/29/2015  CLINICAL DATA:  Shortness of breath.  Uterine cancer. EXAM: CHEST  2 VIEW COMPARISON:  12/27/2015 chest radiograph. FINDINGS: Low lung volumes. Stable cardiomediastinal silhouette with top-normal heart size. No pneumothorax. No pleural effusion. Stable mild-to-moderate elevation of the right hemidiaphragm. No pulmonary edema. Mild bibasilar atelectasis. No acute consolidative airspace disease. IMPRESSION: Low lung volumes. Stable elevation of the right hemidiaphragm with mild bibasilar atelectasis. Electronically Signed   By: Ilona Sorrel M.D.   On: 12/29/2015 12:30   Dg Abd 1 View  12/30/2015  CLINICAL DATA:  Ileus. EXAM:  ABDOMEN - 1 VIEW COMPARISON:  12/29/2015. FINDINGS: Residual oral contrast and stool are seen in the ascending colon. Mild gaseous distention of remaining colon, minimally improved. Gas is seen in nondistended small bowel. IMPRESSION: Minimal improvement in colonic ileus. Electronically Signed   By: Lorin Picket M.D.   On: 12/30/2015 08:36   Dg Abd 1 View  12/29/2015  CLINICAL DATA:  Ascending colitis with septic shock.  Ileus. EXAM: ABDOMEN - 1 VIEW COMPARISON:  None. FINDINGS: Gaseous distension of the colon identified. There is been interval increase in caliber of the large bowel loops with decrease and small bowel air. Enteric contrast material remains within the right colon. IMPRESSION: 1. Imaging findings compatible with colonic ileus. Electronically Signed   By: Kerby Moors M.D.   On: 12/29/2015 08:57   Mr [name] Contrast  12/29/2015  CLINICAL DATA:  60 year old female with vomiting and dizziness for several days. Initial encounter. EXAM: MRI HEAD WITHOUT CONTRAST MRA HEAD WITHOUT CONTRAST TECHNIQUE: Multiplanar, multiecho pulse sequences of  the brain and surrounding structures were obtained without intravenous contrast. Angiographic images of the head were obtained using MRA technique without contrast. COMPARISON:  Head CT without contrast 12/27/2015. Brain MRI 10/04/2015. FINDINGS: MRI HEAD FINDINGS Major intracranial vascular flow voids are stable. No restricted diffusion to suggest acute infarction. No midline shift, mass effect, evidence of mass lesion, ventriculomegaly, extra-axial collection or acute intracranial hemorrhage. Cervicomedullary junction and pituitary are within normal limits. Negative visualized cervical spine. Stable gray and white matter signal throughout the brain; scattered nonspecific cerebral white matter T2 and FLAIR hyperintensity. Visible internal auditory structures appear normal. Mild bilateral mastoid fluid is stable. Negative nasopharynx. Trace paranasal sinus mucosal thickening has not significantly changed. Orbit and scalp soft tissues are within normal limits. Normal bone marrow signal. MRA HEAD FINDINGS Antegrade flow in the posterior circulation with codominant distal vertebral arteries. Normal PICA origins and vertebrobasilar junction. Normal basilar artery, AICA origins, SCA origins, and left PCA origin. Fetal type right PCA origin. Left posterior communicating artery is diminutive or absent. Bilateral PCA branches are within normal limits. Antegrade flow in both ICA siphons. No siphon stenosis. Normal ophthalmic and right posterior communicating artery origins. Normal carotid termini, MCA and ACA origins. Anterior communicating artery is diminutive. Visualized bilateral ACA and MCA branches are within normal limits. IMPRESSION: 1. No acute intracranial abnormality. Stable noncontrast MRI appearance of the brain since January. 2.  Negative intracranial MRA. Electronically Signed   By: Genevie Ann M.D.   On: 12/29/2015 13:46   Mr Angiogram Neck W Wo Contrast  12/29/2015  CLINICAL DATA:  60 year old female with  vomiting and dizziness for several days. Initial encounter. EXAM: MRA NECK WITHOUT AND WITH CONTRAST TECHNIQUE: Multiplanar and multiecho pulse sequences of the neck were obtained without and with intravenous contrast. Angiographic images of the neck were obtained using MRA technique without and with intravenous contrast. CONTRAST:  26m MULTIHANCE GADOBENATE DIMEGLUMINE 529 MG/ML IV SOLN COMPARISON:  Brain MRI and intracranial MRA from today reported separately. FINDINGS: Precontrast time-of-flight images reveal antegrade flow in both carotid and vertebral arteries throughout the neck. Carotid bifurcations appear normal. Vertebral arteries are codominant. Post-contrast neck MRA images reveal a 3 vessel arch configuration. Tortuous proximal great vessels but no great vessel origin stenosis. Tortuous proximal right CCA. Widely patent right carotid bifurcation. Negative cervical right ICA. Tortuous proximal left CCA. Mild irregularity at the left carotid bifurcation primarily affecting the ECA origin. Widely patent left ICA origin and bulb. Negative cervical left  ICA. Normal vertebral artery origins. Mildly dominant right vertebral artery throughout the neck. No vertebral artery stenosis to the vertebrobasilar junction. IMPRESSION: Negative neck MRA aside from tortuosity of the great vessels. Electronically Signed   By: Genevie Ann M.D.   On: 12/29/2015 14:01   Mr Brain Wo Contrast  12/29/2015  CLINICAL DATA:  60 year old female with vomiting and dizziness for several days. Initial encounter. EXAM: MRI HEAD WITHOUT CONTRAST MRA HEAD WITHOUT CONTRAST TECHNIQUE: Multiplanar, multiecho pulse sequences of the brain and surrounding structures were obtained without intravenous contrast. Angiographic images of the head were obtained using MRA technique without contrast. COMPARISON:  Head CT without contrast 12/27/2015. Brain MRI 10/04/2015. FINDINGS: MRI HEAD FINDINGS Major intracranial vascular flow voids are stable. No  restricted diffusion to suggest acute infarction. No midline shift, mass effect, evidence of mass lesion, ventriculomegaly, extra-axial collection or acute intracranial hemorrhage. Cervicomedullary junction and pituitary are within normal limits. Negative visualized cervical spine. Stable gray and white matter signal throughout the brain; scattered nonspecific cerebral white matter T2 and FLAIR hyperintensity. Visible internal auditory structures appear normal. Mild bilateral mastoid fluid is stable. Negative nasopharynx. Trace paranasal sinus mucosal thickening has not significantly changed. Orbit and scalp soft tissues are within normal limits. Normal bone marrow signal. MRA HEAD FINDINGS Antegrade flow in the posterior circulation with codominant distal vertebral arteries. Normal PICA origins and vertebrobasilar junction. Normal basilar artery, AICA origins, SCA origins, and left PCA origin. Fetal type right PCA origin. Left posterior communicating artery is diminutive or absent. Bilateral PCA branches are within normal limits. Antegrade flow in both ICA siphons. No siphon stenosis. Normal ophthalmic and right posterior communicating artery origins. Normal carotid termini, MCA and ACA origins. Anterior communicating artery is diminutive. Visualized bilateral ACA and MCA branches are within normal limits. IMPRESSION: 1. No acute intracranial abnormality. Stable noncontrast MRI appearance of the brain since January. 2.  Negative intracranial MRA. Electronically Signed   By: Genevie Ann M.D.   On: 12/29/2015 13:46    Assessment: 60 y.o. female presenting with syncope and left sided weakness/numbness.  Left sided symptoms persist today with much of the motor symptoms being related to effort.  Head CT personally reviewed and shows no acute changes.  MRI of the brain personally reviewed as well and shows no acute changes.  MRA shows no hemodynamically significant stenosis.  Patient on Eliquis at home.  LDL 105.  A1c  8.2.    Stroke Risk Factors - atrial fibrillation, diabetes mellitus, hyperlipidemia and hypertension  Plan: 1. PT consult, OT consult, Speech consult 2. Orthostatic vitals 3. Prophylactic therapy-Would continue Eliquis unless patient deemed not safe from a syncope standpoint at discharge.  There does not appear to be an indication for additional antiplatelet therapy in addition to the Eliquis.   4. Telemetry monitoring 5. Frequent neuro checks 6. Agressive management of risk factors, DM and lipids    Alexis Goodell, MD Neurology [phone] 12/30/2015, 11:03 AM

## 2015-12-31 LAB — GLUCOSE, CAPILLARY
GLUCOSE-CAPILLARY: 126 mg/dL — AB (ref 65–99)
GLUCOSE-CAPILLARY: 134 mg/dL — AB (ref 65–99)
Glucose-Capillary: 117 mg/dL — ABNORMAL HIGH (ref 65–99)
Glucose-Capillary: 109 mg/dL — ABNORMAL HIGH (ref 65–99)

## 2015-12-31 LAB — CBC WITH DIFFERENTIAL/PLATELET
BASOS ABS: 0 10*3/uL (ref 0–0.1)
BASOS PCT: 0 %
EOS ABS: 0.1 10*3/uL (ref 0–0.7)
Eosinophils Relative: 1 %
HCT: 25.6 % — ABNORMAL LOW (ref 35.0–47.0)
HEMOGLOBIN: 8.3 g/dL — AB (ref 12.0–16.0)
Lymphocytes Relative: 11 %
Lymphs Abs: 0.7 10*3/uL — ABNORMAL LOW (ref 1.0–3.6)
MCH: 28.7 pg (ref 26.0–34.0)
MCHC: 32.3 g/dL (ref 32.0–36.0)
MCV: 88.8 fL (ref 80.0–100.0)
MONOS PCT: 5 %
Monocytes Absolute: 0.3 10*3/uL (ref 0.2–0.9)
NEUTROS PCT: 83 %
Neutro Abs: 5.2 10*3/uL (ref 1.4–6.5)
Platelets: 113 10*3/uL — ABNORMAL LOW (ref 150–440)
RBC: 2.88 MIL/uL — ABNORMAL LOW (ref 3.80–5.20)
RDW: 16.4 % — AB (ref 11.5–14.5)
WBC: 6.3 10*3/uL (ref 3.6–11.0)

## 2015-12-31 LAB — BASIC METABOLIC PANEL
Anion gap: 6 (ref 5–15)
BUN: 10 mg/dL (ref 6–20)
CALCIUM: 8.7 mg/dL — AB (ref 8.9–10.3)
CHLORIDE: 106 mmol/L (ref 101–111)
CO2: 27 mmol/L (ref 22–32)
CREATININE: 0.72 mg/dL (ref 0.44–1.00)
GFR calc non Af Amer: >60 mL/min (ref >60)
Glucose, Bld: 134 mg/dL — ABNORMAL HIGH (ref 65–99)
Potassium: 4 mmol/L (ref 3.5–5.1)
SODIUM: 139 mmol/L (ref 135–145)

## 2015-12-31 MED ORDER — METOPROLOL TARTRATE 25 MG PO TABS
25.0000 mg | ORAL_TABLET | Freq: Two times a day (BID) | ORAL | Status: DC
  Administered 2015-12-31 – 2016-01-01 (×2): 25 mg via ORAL
  Filled 2015-12-31 (×2): qty 1

## 2015-12-31 MED ORDER — ACETAMINOPHEN 325 MG PO TABS: 650.0000 mg | ORAL_TABLET | Freq: Four times a day (QID) | ORAL | Status: DC | PRN

## 2015-12-31 NOTE — Progress Notes (Signed)
[facility] NAME: Dublin Cantero    MR#:  [id number]  DATE OF BIRTH:  [birth year]  SUBJECTIVE:  CHIEF COMPLAINT:   Chief Complaint  Patient presents with  . Code Stroke  . Abdominal Pain   - Patient was recently in the hospital for colitis and was discharged home. Readmitted with left-sided weakness and abdominal pain. -C. difficile is negative. Abdominal pain is chronic but much improved. -Bradycardic during the hospital stay and appreciate cardiology consult. Has taking bradycardia syndrome. -Eliquis and aspirin held due to anemia. No active bleeding or melena noted -Feels much stronger. Heart rate is much improved  REVIEW OF SYSTEMS:  Review of Systems  Constitutional: Positive for malaise/fatigue. Negative for fever and chills.  HENT: Negative for ear discharge, ear pain and nosebleeds.   Eyes: Negative for blurred vision.  Respiratory: Negative for cough, shortness of breath and wheezing.   Cardiovascular: Negative for chest pain, palpitations and leg swelling.  Gastrointestinal: Positive for diarrhea. Negative for nausea, vomiting, abdominal pain and constipation.  Genitourinary: Negative for dysuria and urgency.  Musculoskeletal: Positive for back pain. Negative for myalgias and neck pain.  Neurological: Negative for dizziness, sensory change, speech change, focal weakness, seizures and headaches.  Psychiatric/Behavioral: Negative for depression.    DRUG ALLERGIES:   Allergies  Allergen Reactions  . Hydrocodone Other (See Comments)    Pt states that this medication caused cirrhosis of the liver.    . Erythromycin Other (See Comments)    Reaction:  Fever   . Prednisone Other (See Comments)    Reaction:  Unknown   . Rosiglitazone Maleate Swelling  . Codeine Sulfate Rash  . Tetanus-Diphtheria Toxoids Td Rash and Other (See Comments)    Reaction:  Fever     VITALS:  Blood pressure 132/75, pulse 81,  temperature 98.9 F (37.2 C), temperature source Oral, resp. rate 14, height 5' 3"  (1.6 m), weight 67.314 kg (148 lb 6.4 oz), SpO2 93 %.  PHYSICAL EXAMINATION:  Physical Exam  GENERAL:  60 y.o.-year-old patient lying in the bed with no acute distress.  EYES: Pupils equal, round, reactive to light and accommodation. No scleral icterus. Extraocular muscles intact.  HEENT: Head atraumatic, normocephalic. Oropharynx and nasopharynx clear.  NECK:  Supple, no jugular venous distention. No thyroid enlargement, no tenderness.  LUNGS: Normal breath sounds bilaterally, no wheezing, rales,rhonchi or crepitation. No use of accessory muscles of respiration. Decreased bibasilar breath sounds. CARDIOVASCULAR: S1, S2 normal. No murmurs, rubs, or gallops.  ABDOMEN: Soft, nontender, nondistended. Bowel sounds present. No organomegaly or mass.  EXTREMITIES: No pedal edema, cyanosis, or clubbing.  NEUROLOGIC: Cranial nerves II through XII are intact. Muscle strength 5/5 in all extremities. Sensation intact. Gait not checked.  PSYCHIATRIC: The patient is alert and oriented x 3.  SKIN: No obvious rash, lesion, or ulcer.    LABORATORY PANEL:   CBC  Recent Labs Lab 12/31/15 0553  WBC 6.3  HGB 8.3*  HCT 25.6*  PLT 113*   ------------------------------------------------------------------------------------------------------------------  Chemistries   Recent Labs Lab 12/29/15 0419  12/31/15 0553  NA 137  < > 139  K 3.3*  < > 4.0  CL 110  < > 106  CO2 22  < > 27  GLUCOSE 185*  < > 134*  BUN 13  < > 10  CREATININE 0.56  < > 0.72  CALCIUM 8.0*  < > 8.7*  AST 26  --   --   ALT 21  --   --  ALKPHOS 84  --   --   BILITOT 0.7  --   --   < > = values in this interval not displayed. ------------------------------------------------------------------------------------------------------------------  Cardiac Enzymes  Recent Labs Lab 12/27/15 1804  TROPONINI 0.04*    ------------------------------------------------------------------------------------------------------------------  RADIOLOGY:  Dg Abd 1 View  12/30/2015  CLINICAL DATA:  Ileus. EXAM: ABDOMEN - 1 VIEW COMPARISON:  12/29/2015. FINDINGS: Residual oral contrast and stool are seen in the ascending colon. Mild gaseous distention of remaining colon, minimally improved. Gas is seen in nondistended small bowel. IMPRESSION: Minimal improvement in colonic ileus. Electronically Signed   By: Lorin Picket M.D.   On: 12/30/2015 08:36   Mr [name] Contrast  12/29/2015  CLINICAL DATA:  60 year old female with vomiting and dizziness for several days. Initial encounter. EXAM: MRI HEAD WITHOUT CONTRAST MRA HEAD WITHOUT CONTRAST TECHNIQUE: Multiplanar, multiecho pulse sequences of the brain and surrounding structures were obtained without intravenous contrast. Angiographic images of the head were obtained using MRA technique without contrast. COMPARISON:  Head CT without contrast 12/27/2015. Brain MRI 10/04/2015. FINDINGS: MRI HEAD FINDINGS Major intracranial vascular flow voids are stable. No restricted diffusion to suggest acute infarction. No midline shift, mass effect, evidence of mass lesion, ventriculomegaly, extra-axial collection or acute intracranial hemorrhage. Cervicomedullary junction and pituitary are within normal limits. Negative visualized cervical spine. Stable gray and white matter signal throughout the brain; scattered nonspecific cerebral white matter T2 and FLAIR hyperintensity. Visible internal auditory structures appear normal. Mild bilateral mastoid fluid is stable. Negative nasopharynx. Trace paranasal sinus mucosal thickening has not significantly changed. Orbit and scalp soft tissues are within normal limits. Normal bone marrow signal. MRA HEAD FINDINGS Antegrade flow in the posterior circulation with codominant distal vertebral arteries. Normal PICA origins and vertebrobasilar junction.  Normal basilar artery, AICA origins, SCA origins, and left PCA origin. Fetal type right PCA origin. Left posterior communicating artery is diminutive or absent. Bilateral PCA branches are within normal limits. Antegrade flow in both ICA siphons. No siphon stenosis. Normal ophthalmic and right posterior communicating artery origins. Normal carotid termini, MCA and ACA origins. Anterior communicating artery is diminutive. Visualized bilateral ACA and MCA branches are within normal limits. IMPRESSION: 1. No acute intracranial abnormality. Stable noncontrast MRI appearance of the brain since January. 2.  Negative intracranial MRA. Electronically Signed   By: Genevie Ann M.D.   On: 12/29/2015 13:46   Mr Angiogram Neck W Wo Contrast  12/29/2015  CLINICAL DATA:  60 year old female with vomiting and dizziness for several days. Initial encounter. EXAM: MRA NECK WITHOUT AND WITH CONTRAST TECHNIQUE: Multiplanar and multiecho pulse sequences of the neck were obtained without and with intravenous contrast. Angiographic images of the neck were obtained using MRA technique without and with intravenous contrast. CONTRAST:  7m MULTIHANCE GADOBENATE DIMEGLUMINE 529 MG/ML IV SOLN COMPARISON:  Brain MRI and intracranial MRA from today reported separately. FINDINGS: Precontrast time-of-flight images reveal antegrade flow in both carotid and vertebral arteries throughout the neck. Carotid bifurcations appear normal. Vertebral arteries are codominant. Post-contrast neck MRA images reveal a 3 vessel arch configuration. Tortuous proximal great vessels but no great vessel origin stenosis. Tortuous proximal right CCA. Widely patent right carotid bifurcation. Negative cervical right ICA. Tortuous proximal left CCA. Mild irregularity at the left carotid bifurcation primarily affecting the ECA origin. Widely patent left ICA origin and bulb. Negative cervical left ICA. Normal vertebral artery origins. Mildly dominant right vertebral artery  throughout the neck. No vertebral artery stenosis to the vertebrobasilar junction. IMPRESSION: Negative  neck MRA aside from tortuosity of the great vessels. Electronically Signed   By: Genevie Ann M.D.   On: 12/29/2015 14:01   Mr Brain Wo Contrast  12/29/2015  CLINICAL DATA:  60 year old female with vomiting and dizziness for several days. Initial encounter. EXAM: MRI HEAD WITHOUT CONTRAST MRA HEAD WITHOUT CONTRAST TECHNIQUE: Multiplanar, multiecho pulse sequences of the brain and surrounding structures were obtained without intravenous contrast. Angiographic images of the head were obtained using MRA technique without contrast. COMPARISON:  Head CT without contrast 12/27/2015. Brain MRI 10/04/2015. FINDINGS: MRI HEAD FINDINGS Major intracranial vascular flow voids are stable. No restricted diffusion to suggest acute infarction. No midline shift, mass effect, evidence of mass lesion, ventriculomegaly, extra-axial collection or acute intracranial hemorrhage. Cervicomedullary junction and pituitary are within normal limits. Negative visualized cervical spine. Stable gray and white matter signal throughout the brain; scattered nonspecific cerebral white matter T2 and FLAIR hyperintensity. Visible internal auditory structures appear normal. Mild bilateral mastoid fluid is stable. Negative nasopharynx. Trace paranasal sinus mucosal thickening has not significantly changed. Orbit and scalp soft tissues are within normal limits. Normal bone marrow signal. MRA HEAD FINDINGS Antegrade flow in the posterior circulation with codominant distal vertebral arteries. Normal PICA origins and vertebrobasilar junction. Normal basilar artery, AICA origins, SCA origins, and left PCA origin. Fetal type right PCA origin. Left posterior communicating artery is diminutive or absent. Bilateral PCA branches are within normal limits. Antegrade flow in both ICA siphons. No siphon stenosis. Normal ophthalmic and right posterior communicating  artery origins. Normal carotid termini, MCA and ACA origins. Anterior communicating artery is diminutive. Visualized bilateral ACA and MCA branches are within normal limits. IMPRESSION: 1. No acute intracranial abnormality. Stable noncontrast MRI appearance of the brain since January. 2.  Negative intracranial MRA. Electronically Signed   By: Genevie Ann M.D.   On: 12/29/2015 13:46    EKG:   Orders placed or performed during the hospital encounter of 12/27/15  . ED EKG  . ED EKG  . EKG 12-Lead  . EKG 12-Lead    ASSESSMENT AND PLAN:   60 year old female with multiple medical problems including chronic abdominal pain, diabetes mellitus, hypothyroidism, paroxysmal atrial fibrillation, history of CVA and TIAs, uterine cancer, liver cirrhosis nonalcoholic admitted to the hospital secondary to left-sided weakness. Also has been having diarrhea.  #1 ascending colitis with septic shock-sepsis has resolved. -On IV Zosyn. Diarrhea is improving. Discontinue lactulose as had loose stools again today. -Rectal bleeding yesterday, hold eliquis and aspirin. -Continue to monitor hemoglobin. No indication for transfusion as on his hemoglobin greater than 7.5  #2 paroxysmal atrial fibrillation with take the bradycardia syndrome-known history of tachycardia but and bradycardic this admission. -Appreciate an supple. Heart rate is back to normal. -Eliquis is on hold. -Continue low-dose metoprolol. Discontinue other rate controlling meds  #3 chronic abdominal pain-continue chronic pain medications.  #4 diabetes mellitus-on Levemir and sliding scale insulin. Also on NovoLog with meals.  #5 Left sided weakness- resolved. Was already on eliquis when had the symptoms - MRI and MRA of the brain are normal. - cont statin, hold aspirin and eliquis and GI bleed is resolved - Echo showed no thrombus. Patient seems to have functional weakness -Pressured neurology consult. No further recommendations.  #6 NASH - with  Cirrhosis Stable  Physical therapy consulted. -Possible discharge home tomorrow   All the records are reviewed and case discussed with Care Management/Social Workerr. Management plans discussed with the patient, family and they are in agreement.  CODE STATUS:  DNR  TOTAL TIME TAKING CARE OF THIS PATIENT: 37 minutes.   POSSIBLE D/C IN 1 DAY, DEPENDING ON CLINICAL CONDITION.   Gladstone Lighter M.D on 12/31/2015 at 11:55 AM  Between 7am to 6pm - Pager - [phone]  After 6pm go to [email or web address] - password [email or web address] [facility]  [facility]  Office  [phone]  CC: Primary care physician; [place] Pink, MD

## 2015-12-31 NOTE — Progress Notes (Signed)
Patient remains alert and oriented, denies pain or discomfort, pt independent in the room up ambulating outside room frequently, loose stool times 3 , md notified for possible c diff r/o, md said she will d/c lactulose for now. Will continue monitoring for more loose stool.

## 2015-12-31 NOTE — Progress Notes (Signed)
    Subjective:  Denies CP or dyspnea; abd pain improving   Objective:  Filed Vitals:   12/30/15 1131 12/30/15 1935 12/31/15 0416 12/31/15 1102  BP: 130/66 131/78 140/72 132/75  Pulse: 88 103 86 81  Temp: 98.3 F (36.8 C) 98 F (36.7 C) 98.3 F (36.8 C) 98.9 F (37.2 C)  TempSrc: Oral Oral Oral Oral  Resp: 18 16 16 14   Height:      Weight:      SpO2: 96% 93% 93% 93%    Intake/Output from previous day:  Intake/Output Summary (Last 24 hours) at 12/31/15 1227 Last data filed at 12/31/15 0830  Gross per 24 hour  Intake    660 ml  Output    751 ml  Net    -91 ml    Physical Exam: Physical exam: Well-developed well-nourished in no acute distress.  Skin is warm and dry.  HEENT is normal.  Neck is supple.  Chest is clear to auscultation with normal expansion.  Cardiovascular exam is regular rate and rhythm. 2/6 systolic murmur Abdominal exam mildly tender. No masses palpated. Extremities show trace edema. neuro grossly intact    Lab Results: Basic Metabolic Panel:  Recent Labs  12/30/15 0658 12/31/15 0553  NA 138 139  K 4.5 4.0  CL 109 106  CO2 26 27  GLUCOSE 144* 134*  BUN 9 10  CREATININE 0.68 0.72  CALCIUM 8.6* 8.7*   CBC:  Recent Labs  12/30/15 0658 12/30/15 1430 12/31/15 0553  WBC 5.5  --  6.3  NEUTROABS 4.4  --  5.2  HGB 8.7* 9.1* 8.3*  HCT 27.4*  --  25.6*  MCV 90.8  --  88.8  PLT 120*  --  113*     Assessment/Plan:  1. Syncope: -Occuring x 2 s/p recurrent episode of vomiting and diarrhea - Possibly vasovagal in the setting of hypotension related to underlying infection.  2. PAF: -Currently in sinus  - Change metoprolol to 25 mg BID; follow HR; amiodarone has been discontinued. ASA DCed; would resume apixaban in AM if Hgb stable and follow closely as outpt.  3. Colitis with septic shock: - Shock resolved.  4. History of stroke with residual left-sided weakness: - Resume apixaban in AM prior to DC if no GI bleeding and Hgb  stable.  5. H/O tachybrady - Would have patient fu with Dr Caryl Comes as outpt; would also arrange FU with Dr Fletcher Anon.  Kirk Ruths 12/31/2015, 12:27 PM

## 2015-12-31 NOTE — Progress Notes (Signed)
Occupational Therapy Treatment Patient Details Name: MAIKAYLA BEGGS MRN: [id number] DOB: 1955-12-12 Today's Date: 12/31/2015    History of present illness 60 yo F presented to ED with abdominal pain with N/V/D for 4 days. She also reported having acute L sided weakness with hx of CVAs and being worked up for possible new CVA.Marland Kitchen She is currently on enteric precautions for possible c-diff. PMH includes DM I, cancer, TIA, PAF, and cirrhosis of the liver not due to alcohol.   OT comments  Patient has made excellent progress in all areas.  Overall supervision for self care tasks this date with use of walker to go to and from bathroom.  Was able to complete dressing skills with supervision.  Patient seen for UB strengthening exercises for LUE and able to perform with minimal cues.  Do not anticipate need for follow up OT at discharge however recommend 3 in 1 commode for home. Continue to work towards increasing independence during hospital stay.   Follow Up Recommendations  No OT follow up    Equipment Recommendations  3 in 1 bedside comode    Recommendations for Other Services      Precautions / Restrictions Precautions Precautions: Fall Restrictions Weight Bearing Restrictions: No       Mobility Bed Mobility Overal bed mobility: Modified Independent Bed Mobility: Supine to Sit;Sit to Supine              Transfers Overall transfer level: Modified independent Equipment used: Rolling walker (2 wheeled) Transfers: Sit to/from Stand Sit to Stand: Modified independent (Device/Increase time)              Balance                                   ADL Overall ADL's : Needs assistance/impaired Eating/Feeding: Independent   Grooming: Standing;Supervision/safety           Upper Body Dressing : Modified independent   Lower Body Dressing: Set up;Supervision/safety   Toilet Transfer: Supervision/safety   Toileting- Clothing Manipulation and Hygiene:  Modified independent         General ADL Comments: Patient able to ambulate around the nurses station 2 times with walker and supervision this date, no cues required,      Vision                     Perception     Praxis      Cognition   Behavior During Therapy: [facility] for tasks assessed/performed Overall Cognitive Status: Within Functional Limits for tasks assessed                       Extremity/Trunk Assessment               Exercises     Shoulder Instructions       General Comments      Pertinent Vitals/ Pain       Pain Assessment: No/denies pain Pain Score: 0-No pain  Home Living                                          Prior Functioning/Environment              Frequency Min 1X/week     Progress Toward Goals  OT Goals(current goals  can now be found in the care plan section)  Progress towards OT goals: Progressing toward goals  Acute Rehab OT Goals Patient Stated Goal: "go home" OT Goal Formulation: With patient Time For Goal Achievement: 01/11/16 Potential to Achieve Goals: Good  Plan Discharge plan remains appropriate    Co-evaluation                 End of Session Equipment Utilized During Treatment: Gait belt;Rolling walker   Activity Tolerance Patient tolerated treatment well   Patient Left in bed;with call bell/phone within reach   Nurse Communication          Time: 1129-1205 OT Time Calculation (min): 36 min  Charges: OT General Charges $OT Visit: 1 Procedure OT Treatments $Self Care/Home Management : 8-22 mins $Therapeutic Exercise: 8-22 mins Onesty Clair T Jarelyn Bambach, OTR/L, CLT  Taegan Standage 12/31/2015, 12:11 PM

## 2016-01-01 LAB — CBC
HEMATOCRIT: 26.4 % — AB (ref 35.0–47.0)
HEMOGLOBIN: 8.5 g/dL — AB (ref 12.0–16.0)
MCH: 28.9 pg (ref 26.0–34.0)
MCHC: 32 g/dL (ref 32.0–36.0)
MCV: 90.1 fL (ref 80.0–100.0)
Platelets: 117 10*3/uL — ABNORMAL LOW (ref 150–440)
RBC: 2.93 MIL/uL — ABNORMAL LOW (ref 3.80–5.20)
RDW: 15.9 % — ABNORMAL HIGH (ref 11.5–14.5)
WBC: 4.3 10*3/uL (ref 3.6–11.0)

## 2016-01-01 LAB — GLUCOSE, CAPILLARY
GLUCOSE-CAPILLARY: 78 mg/dL (ref 65–99)
Glucose-Capillary: 218 mg/dL — ABNORMAL HIGH (ref 65–99)
Glucose-Capillary: 109 mg/dL — ABNORMAL HIGH (ref 65–99)

## 2016-01-01 LAB — BASIC METABOLIC PANEL
ANION GAP: 6 (ref 5–15)
BUN: 12 mg/dL (ref 6–20)
CALCIUM: 8.7 mg/dL — AB (ref 8.9–10.3)
CO2: 29 mmol/L (ref 22–32)
Chloride: 105 mmol/L (ref 101–111)
Creatinine, Ser: 0.7 mg/dL (ref 0.44–1.00)
Glucose, Bld: 182 mg/dL — ABNORMAL HIGH (ref 65–99)
POTASSIUM: 4 mmol/L (ref 3.5–5.1)
SODIUM: 140 mmol/L (ref 135–145)

## 2016-01-01 MED ORDER — ATORVASTATIN CALCIUM 40 MG PO TABS: 40.0000 mg | ORAL_TABLET | Freq: Every day | ORAL | Status: DC

## 2016-01-01 MED ORDER — AMOXICILLIN-POT CLAVULANATE 875-125 MG PO TABS: 1.0000 | ORAL_TABLET | Freq: Two times a day (BID) | ORAL | Status: DC

## 2016-01-01 NOTE — Progress Notes (Signed)
Patient being discharged home with home health, discharged instruction provided, iv removed, patient waiting for her ride home.

## 2016-01-01 NOTE — Discharge Summary (Signed)
[facility] NAME: [name]    MR#:  [id number]  DATE OF BIRTH:  [birth year]  DATE OF ADMISSION:  12/27/2015 ADMITTING PHYSICIAN: Max Sane, MD  DATE OF DISCHARGE: 01/01/2016  PRIMARY CARE PHYSICIAN: Maryland Pink, MD    ADMISSION DIAGNOSIS:  Generalized abdominal pain [R10.84] Left leg weakness [R29.898] Weakness of left leg [R29.898] Weakness of left arm [R29.898] Weakness on left side of face [R29.810]  DISCHARGE DIAGNOSIS:  Active Problems:   Essential hypertension   Hypokalemia   Syncope due to orthostatic hypotension   Symptomatic bradycardia   Paroxysmal atrial fibrillation (HCC)   Abdominal pain   Tachy-brady syndrome ([place])   SECONDARY DIAGNOSIS:   Past Medical History  Diagnosis Date  . Type 1 diabetes ([place])   . Hypothyroid   . Degenerative disk disease   . Stomach ulcer   . Diverticulitis   . Syncope 01/2015  . Anxiety   . GERD (gastroesophageal reflux disease)   . History of hiatal hernia   . Cancer (HCC)     HX OF CANCER OF UTERUS   . TIA (transient ischemic attack) 02/2015  . Hypertension   . Pancreatitis   . PAF (paroxysmal atrial fibrillation) ([place]) 03/2015    a. new onset 03/2015 in setting of intractable N/V; b. on Eliquis 5 mg bid; c. CHADSVASc 4 (DM, TIA x 2, female)  . Intussusception intestine ([place]) 05/2015  . Stroke ([place])   . Allergy   . Cirrhosis of liver not due to alcohol [facility]) 2016    HOSPITAL COURSE:   60 year old female with multiple medical problems including chronic abdominal pain, diabetes mellitus, hypothyroidism, paroxysmal atrial fibrillation, history of CVA and TIAs, uterine cancer, liver cirrhosis nonalcoholic admitted to the hospital secondary to left-sided weakness. Also has been having diarrhea.  #1 ascending colitis with septic shock-sepsis has resolved. - was on IV Zosyn. Change to augmentin at discharge, recently treated with flagyl - No further  diarrhea -stable hb, no further rectal bleed, outpatient GI f/u  #2 paroxysmal atrial fibrillation with Tachy-bradycardia syndrome-known history of tachycardia but and bradycardic this admission. RESOLVED now -Appreciate cardiology consult. Heart rate is back to normal. -Eliquis restarted. Monitor hb as outpatient. Stable here -Continue low-dose metoprolol.  - Outpatient f/u with EP cardiologist Dr. Caryl Comes recommended  #3 chronic abdominal pain-continue chronic pain medications. Doing well now  #4 diabetes mellitus- continue home dose of levemir and novolog with meals.  #5 Left sided weakness- resolved. Was already on eliquis when had the symptoms - MRI and MRA of the brain are normal. - cont statin - Echo showed no thrombus. Patient seems to have functional weakness- resolved now -Appreciate neurology consult. No further recommendations.  #6 NASH - with Cirrhosis Stable  Physical therapy consulted. Patient ambulating well without any discomfort. Has a walker at baseline. Being discharged today  DISCHARGE CONDITIONS:   Stable  CONSULTS OBTAINED:  Treatment Team:  Minna Merritts, MD Leonie Man, MD Alexis Goodell, MD  DRUG ALLERGIES:   Allergies  Allergen Reactions  . Hydrocodone Other (See Comments)    Pt states that this medication caused cirrhosis of the liver.    . Erythromycin Other (See Comments)    Reaction:  Fever   . Prednisone Other (See Comments)    Reaction:  Unknown   . Rosiglitazone Maleate Swelling  . Codeine Sulfate Rash  . Tetanus-Diphtheria Toxoids Td Rash and Other (See Comments)    Reaction:  Fever  DISCHARGE MEDICATIONS:   Current Discharge Medication List    START taking these medications   Details  amoxicillin-clavulanate (AUGMENTIN) 875-125 MG tablet Take 1 tablet by mouth 2 (two) times daily. X 5 more days Qty: 10 tablet, Refills: 0    atorvastatin (LIPITOR) 40 MG tablet Take 1 tablet (40 mg total) by mouth daily at 6  PM. Qty: 30 tablet, Refills: 2      CONTINUE these medications which have NOT CHANGED   Details  albuterol (PROVENTIL HFA;VENTOLIN HFA) 108 (90 Base) MCG/ACT inhaler Inhale 2 puffs into the lungs every 6 (six) hours as needed for wheezing or shortness of breath. Qty: 1 Inhaler, Refills: 2    amLODipine (NORVASC) 5 MG tablet Take 5 mg by mouth daily.    apixaban (ELIQUIS) 5 MG TABS tablet Take 1 tablet (5 mg total) by mouth 2 (two) times daily. Qty: 60 tablet, Refills: 0    benzonatate (TESSALON) 200 MG capsule Take 1 capsule (200 mg total) by mouth 3 (three) times daily as needed for cough. Qty: 20 capsule, Refills: 0    budesonide-formoterol (SYMBICORT) 160-4.5 MCG/ACT inhaler Inhale 2 puffs into the lungs 2 (two) times daily. Qty: 1 Inhaler, Refills: 12    citalopram (CELEXA) 20 MG tablet Take 20 mg by mouth daily.    dicyclomine (BENTYL) 10 MG capsule Take 10 mg by mouth 4 (four) times daily - after meals and at bedtime.     insulin aspart (NOVOLOG) 100 UNIT/ML injection Inject into the skin 3 (three) times daily with meals as needed for high blood sugar. Pt uses as needed per sliding scale.    insulin detemir (LEVEMIR) 100 UNIT/ML injection Inject 17 Units into the skin at bedtime.    levothyroxine (SYNTHROID, LEVOTHROID) 75 MCG tablet Take 1 tablet (75 mcg total) by mouth daily before breakfast. Qty: 30 tablet, Refills: 1    magnesium oxide (MAG-OX) 400 (241.3 Mg) MG tablet Take 1 tablet (400 mg total) by mouth 2 (two) times daily. Qty: 10 tablet, Refills: 0    metoCLOPramide (REGLAN) 5 MG tablet Take 1 tablet (5 mg total) by mouth 4 (four) times daily -  before meals and at bedtime. Qty: 90 tablet, Refills: 0    metoprolol tartrate (LOPRESSOR) 25 MG tablet Take 25 mg by mouth 2 (two) times daily.     morphine (MS CONTIN) 15 MG 12 hr tablet Take 1 tablet (15 mg total) by mouth every 8 (eight) hours. Qty: 75 tablet, Refills: 0    oxyCODONE (OXY IR/ROXICODONE) 5 MG  immediate release tablet Take 5-10 mg by mouth every 6 (six) hours as needed for severe pain.    pantoprazole (PROTONIX) 40 MG tablet Take 40 mg by mouth 2 (two) times daily before a meal.    rifaximin (XIFAXAN) 550 MG TABS tablet Take 1 tablet (550 mg total) by mouth 2 (two) times daily. Qty: 60 tablet, Refills: 6    tiZANidine (ZANAFLEX) 4 MG tablet Take 4 mg by mouth 3 (three) times daily as needed for muscle spasms.       STOP taking these medications     lisinopril (PRINIVIL,ZESTRIL) 5 MG tablet      predniSONE (DELTASONE) 10 MG tablet      metroNIDAZOLE (FLAGYL) 500 MG tablet          DISCHARGE INSTRUCTIONS:   1. PCP f/u in 1-2 weeks 2. Monitor hemoglobin while on eliquis 3. Cardiology f/u with Dr. Fletcher Anon and also Dr. Caryl Comes in 2-3 weeks 4. GI  f/u in 3-4 weeks for anemia   If you experience worsening of your admission symptoms, develop shortness of breath, life threatening emergency, suicidal or homicidal thoughts you must seek medical attention immediately by calling 911 or calling your MD immediately  if symptoms less severe.  You Must read complete instructions/literature along with all the possible adverse reactions/side effects for all the Medicines you take and that have been prescribed to you. Take any new Medicines after you have completely understood and accept all the possible adverse reactions/side effects.   Please note  You were cared for by a hospitalist during your hospital stay. If you have any questions about your discharge medications or the care you received while you were in the hospital after you are discharged, you can call the unit and asked to speak with the hospitalist on call if the hospitalist that took care of you is not available. Once you are discharged, your primary care physician will handle any further medical issues. Please note that NO REFILLS for any discharge medications will be authorized once you are discharged, as it is imperative that  you return to your primary care physician (or establish a relationship with a primary care physician if you do not have one) for your aftercare needs so that they can reassess your need for medications and monitor your lab values.    Today   CHIEF COMPLAINT:   Chief Complaint  Patient presents with  . Code Stroke  . Abdominal Pain    VITAL SIGNS:  Blood pressure 141/69, pulse 81, temperature 97.9 F (36.6 C), temperature source Oral, resp. rate 16, height 5' 3"  (1.6 m), weight 67.314 kg (148 lb 6.4 oz), SpO2 96 %.  I/O:   Intake/Output Summary (Last 24 hours) at 01/01/16 1019 Last data filed at 01/01/16 0919  Gross per 24 hour  Intake    720 ml  Output   1952 ml  Net  -1232 ml    PHYSICAL EXAMINATION:   Physical Exam  GENERAL: 60 y.o.-year-old patient lying in the bed with no acute distress.  EYES: Pupils equal, round, reactive to light and accommodation. No scleral icterus. Extraocular muscles intact.  HEENT: Head atraumatic, normocephalic. Oropharynx and nasopharynx clear.  NECK: Supple, no jugular venous distention. No thyroid enlargement, no tenderness.  LUNGS: Normal breath sounds bilaterally, no wheezing, rales,rhonchi or crepitation. No use of accessory muscles of respiration. Decreased bibasilar breath sounds. CARDIOVASCULAR: S1, S2 normal. No murmurs, rubs, or gallops.  ABDOMEN: Soft, nontender, nondistended. Bowel sounds present. No organomegaly or mass.  EXTREMITIES: No pedal edema, cyanosis, or clubbing.  NEUROLOGIC: Cranial nerves II through XII are intact. Muscle strength 5/5 in all extremities. Sensation intact. Gait not checked.  PSYCHIATRIC: The patient is alert and oriented x 3.  SKIN: No obvious rash, lesion, or ulcer.   DATA REVIEW:   CBC  Recent Labs Lab 01/01/16 0611  WBC 4.3  HGB 8.5*  HCT 26.4*  PLT 117*    Chemistries   Recent Labs Lab 12/29/15 0419  01/01/16 0611  NA 137  < > 140  K 3.3*  < > 4.0  CL 110  < > 105   CO2 22  < > 29  GLUCOSE 185*  < > 182*  BUN 13  < > 12  CREATININE 0.56  < > 0.70  CALCIUM 8.0*  < > 8.7*  AST 26  --   --   ALT 21  --   --   ALKPHOS 84  --   --  BILITOT 0.7  --   --   < > = values in this interval not displayed.  Cardiac Enzymes  Recent Labs Lab 12/27/15 1804  TROPONINI 0.04*    Microbiology Results  Results for orders placed or performed during the hospital encounter of 12/27/15  MRSA PCR Screening     Status: None   Collection Time: 12/28/15  5:28 AM  Result Value Ref Range Status   MRSA by PCR NEGATIVE NEGATIVE Final    Comment:        The GeneXpert MRSA Assay (FDA approved for NASAL specimens only), is one component of a comprehensive MRSA colonization surveillance program. It is not intended to diagnose MRSA infection nor to guide or monitor treatment for MRSA infections.   CULTURE, BLOOD (ROUTINE X 2) w Reflex to PCR ID Panel     Status: None (Preliminary result)   Collection Time: 12/28/15 10:01 AM  Result Value Ref Range Status   Specimen Description BLOOD LEFT HAND  Final   Special Requests BOTTLES DRAWN AEROBIC AND ANAEROBIC [place]  Final   Culture  Setup Time   Final    GRAM POSITIVE COCCI AEROBIC BOTTLE ONLY CRITICAL RESULT CALLED TO, READ BACK BY AND VERIFIED WITH: HENRY ZOMPA 12/29/15 1156 MLM    Culture   Final    COAGULASE NEGATIVE STAPHYLOCOCCUS AEROBIC BOTTLE ONLY Results consistent with contamination.    Report Status PENDING  Incomplete  CULTURE, BLOOD (ROUTINE X 2) w Reflex to PCR ID Panel     Status: None (Preliminary result)   Collection Time: 12/28/15 10:01 AM  Result Value Ref Range Status   Specimen Description BLOOD LEFT ARM  Final   Special Requests BOTTLES DRAWN AEROBIC AND ANAEROBIC [place]  Final   Culture NO GROWTH 4 DAYS  Final   Report Status PENDING  Incomplete  Blood Culture ID Panel (Reflexed)     Status: Abnormal   Collection Time: 12/28/15 10:01 AM  Result Value Ref Range Status    Enterococcus species NOT DETECTED NOT DETECTED Final   Vancomycin resistance NOT DETECTED NOT DETECTED Final   Listeria monocytogenes NOT DETECTED NOT DETECTED Final   Staphylococcus species DETECTED (A) NOT DETECTED Final    Comment: CRITICAL RESULT CALLED TO, READ BACK BY AND VERIFIED WITH: HENRY ZOMPA 12/29/15 1156 MLM    Staphylococcus aureus NOT DETECTED NOT DETECTED Final   Methicillin resistance DETECTED (A) NOT DETECTED Final    Comment: CRITICAL RESULT CALLED TO, READ BACK BY AND VERIFIED WITH: HENRY ZOMPA 12/29/15 1156 MLM    Streptococcus species NOT DETECTED NOT DETECTED Final   Streptococcus agalactiae NOT DETECTED NOT DETECTED Final   Streptococcus pneumoniae NOT DETECTED NOT DETECTED Final   Streptococcus pyogenes NOT DETECTED NOT DETECTED Final   Acinetobacter baumannii NOT DETECTED NOT DETECTED Final   Enterobacteriaceae species NOT DETECTED NOT DETECTED Final   Enterobacter cloacae complex NOT DETECTED NOT DETECTED Final   Escherichia coli NOT DETECTED NOT DETECTED Final   Klebsiella oxytoca NOT DETECTED NOT DETECTED Final   Klebsiella pneumoniae NOT DETECTED NOT DETECTED Final   Proteus species NOT DETECTED NOT DETECTED Final   Serratia marcescens NOT DETECTED NOT DETECTED Final   Carbapenem resistance NOT DETECTED NOT DETECTED Final   Haemophilus influenzae NOT DETECTED NOT DETECTED Final   Neisseria meningitidis NOT DETECTED NOT DETECTED Final   Pseudomonas aeruginosa NOT DETECTED NOT DETECTED Final   Candida albicans NOT DETECTED NOT DETECTED Final   Candida glabrata NOT DETECTED NOT DETECTED Final   Candida  krusei NOT DETECTED NOT DETECTED Final   Candida parapsilosis NOT DETECTED NOT DETECTED Final   Candida tropicalis NOT DETECTED NOT DETECTED Final  C difficile quick scan w PCR reflex     Status: None   Collection Time: 12/29/15 10:15 AM  Result Value Ref Range Status   C Diff antigen NEGATIVE NEGATIVE Final   C Diff toxin NEGATIVE NEGATIVE Final   C Diff  interpretation Negative for C. difficile  Final    RADIOLOGY:  No results found.  EKG:   Orders placed or performed during the hospital encounter of 12/27/15  . ED EKG  . ED EKG  . EKG 12-Lead  . EKG 12-Lead      Management plans discussed with the patient, family and they are in agreement.  CODE STATUS:     Code Status Orders        Start     Ordered   12/27/15 2302  Do not attempt resuscitation (DNR)   Continuous    Question Answer Comment  In the event of cardiac or respiratory ARREST Do not call a "code blue"   In the event of cardiac or respiratory ARREST Do not perform Intubation, CPR, defibrillation or ACLS   In the event of cardiac or respiratory ARREST Use medication by any route, position, wound care, and other measures to relive pain and suffering. May use oxygen, suction and manual treatment of airway obstruction as needed for comfort.      12/27/15 2302    Code Status History    Date Active Date Inactive Code Status Order ID Comments User Context   12/16/2015  4:54 PM 12/19/2015  4:52 PM DNR [id number]  Loletha Grayer, MD ED   11/11/2015 11:36 AM 11/16/2015  2:05 PM Full Code [id number]  Samella Parr, NP Inpatient   10/15/2015  1:23 AM 10/16/2015  6:17 PM Full Code [id number]  Lance Coon, MD Inpatient   10/04/2015  8:19 PM 10/08/2015  6:34 PM Full Code [id number]  Bethena Roys, MD Inpatient   08/15/2015  1:05 AM 08/16/2015  8:04 PM Full Code [id number]  Harrie Foreman, MD Inpatient   08/01/2015  1:25 AM 08/06/2015  5:41 PM Full Code [id number]  Harrie Foreman, MD Inpatient   07/04/2015  2:53 AM 07/11/2015  6:33 PM Full Code [id number]  Lytle Butte, MD ED   05/27/2015 10:48 PM 05/31/2015  2:37 PM Full Code [id number]  Rise Patience, MD Inpatient   05/19/2015  1:14 AM 05/22/2015  4:28 PM Full Code [id number]  Rise Patience, MD Inpatient   04/05/2015  9:16 AM 04/07/2015  5:23 PM Full Code [id number]  Bettey Costa, MD Inpatient   04/03/2015 12:31 PM 04/05/2015   9:16 AM Full Code [id number]  Bettey Costa, MD Inpatient   03/11/2015  4:49 PM 03/14/2015  3:50 PM Full Code [id number]  Dustin Flock, MD Inpatient   02/15/2015  4:51 PM 02/17/2015  2:33 PM DNR [id number]  Dustin Flock, MD Inpatient   02/15/2015  4:35 PM 02/15/2015  4:51 PM Full Code [id number]  Dustin Flock, MD Inpatient   01/11/2015  2:40 AM 01/12/2015  6:11 PM Full Code [id number]  Etta Quill, DO ED    Advance Directive Documentation        Most Recent Value   Type of Advance Directive  Healthcare Power of Attorney, Living will   Pre-existing out of facility DNR order (yellow form or pink MOST form)     "MOST" Form in  Place?        TOTAL TIME TAKING CARE OF THIS PATIENT: 37  minutes.    Gladstone Lighter M.D on 01/01/2016 at 10:19 AM  Between 7am to 6pm - Pager - [phone]  After 6pm go to [email or web address] - password [email or web address] [facility]  [place] [facility]  Office  [phone]  CC: Primary care physician; Maryland Pink, MD

## 2016-01-01 NOTE — Progress Notes (Signed)
Patient discharged home as per order

## 2016-01-01 NOTE — Progress Notes (Signed)
Bedside report received, patient remains alert and oriented, denies any pain or discomfort , up ambulating out of the room using a walker , patient independent in the room .

## 2016-01-01 NOTE — Care Management Important Message (Signed)
Important Message  Patient Details  Name: ROMONA MURDY MRN: [id number] Date of Birth: 03/21/1956   Medicare Important Message Given:  Yes    Niraj Kudrna A, RN 01/01/2016, 2:10 PM

## 2016-01-01 NOTE — Care Management Note (Signed)
Case Management Note  Patient Details  Name: [name] MRN: [id number] Date of Birth: 1956-01-19  Subjective/Objective:        A referral for home health RN and PT was called to [place] at [facility]. Ms Gentz has a rolling walker and is on chronic Eliquis.            Action/Plan:   Expected Discharge Date:                  Expected Discharge Plan:  [place]  In-House Referral:     Discharge planning Services  CM Consult  Post Acute Care Choice:  Home Health Choice offered to:  Patient  DME Arranged:    DME Agency:     HH Arranged:  RN, PT [place] Agency:  [facility]  Status of Service:  In process, will continue to follow  Medicare Important Message Given:  Yes Date Medicare IM Given:    Medicare IM give by:    Date Additional Medicare IM Given:    Additional Medicare Important Message give by:     If discussed at [place] of Stay Meetings, dates discussed:    Additional Comments:  Tino Ronan A, RN 01/01/2016, 11:16 AM

## 2016-01-02 LAB — CULTURE, BLOOD (ROUTINE X 2)

## 2016-01-07 LAB — CULTURE, BLOOD (ROUTINE X 2): Culture: NO GROWTH

## 2016-01-10 ENCOUNTER — Ambulatory Visit (INDEPENDENT_AMBULATORY_CARE_PROVIDER_SITE_OTHER): Payer: Commercial Managed Care - HMO | Admitting: Internal Medicine

## 2016-01-10 ENCOUNTER — Encounter: Payer: Self-pay | Admitting: Internal Medicine

## 2016-01-10 ENCOUNTER — Emergency Department: Payer: Commercial Managed Care - HMO

## 2016-01-10 ENCOUNTER — Encounter: Payer: Self-pay | Admitting: Emergency Medicine

## 2016-01-10 ENCOUNTER — Inpatient Hospital Stay
Admission: EM | Admit: 2016-01-10 | Discharge: 2016-01-13 | DRG: 074 | Disposition: A | Discharge status: Home / Self Care | Payer: Commercial Managed Care - HMO | Attending: Internal Medicine | Admitting: Internal Medicine

## 2016-01-10 VITALS — BP 146/100 | HR 130 | Ht 63.0 in | Wt 131.0 lb

## 2016-01-10 DIAGNOSIS — Z885 Allergy status to narcotic agent status: Secondary | ICD-10-CM

## 2016-01-10 DIAGNOSIS — Z8711 Personal history of peptic ulcer disease: Secondary | ICD-10-CM | POA: Diagnosis not present

## 2016-01-10 DIAGNOSIS — Z7901 Long term (current) use of anticoagulants: Secondary | ICD-10-CM

## 2016-01-10 DIAGNOSIS — J449 Chronic obstructive pulmonary disease, unspecified: Secondary | ICD-10-CM | POA: Diagnosis present

## 2016-01-10 DIAGNOSIS — K3184 Gastroparesis: Secondary | ICD-10-CM | POA: Diagnosis present

## 2016-01-10 DIAGNOSIS — E785 Hyperlipidemia, unspecified: Secondary | ICD-10-CM | POA: Diagnosis present

## 2016-01-10 DIAGNOSIS — E1043 Type 1 diabetes mellitus with diabetic autonomic (poly)neuropathy: Principal | ICD-10-CM | POA: Diagnosis present

## 2016-01-10 DIAGNOSIS — R748 Abnormal levels of other serum enzymes: Secondary | ICD-10-CM | POA: Diagnosis not present

## 2016-01-10 DIAGNOSIS — K219 Gastro-esophageal reflux disease without esophagitis: Secondary | ICD-10-CM | POA: Diagnosis present

## 2016-01-10 DIAGNOSIS — I248 Other forms of acute ischemic heart disease: Secondary | ICD-10-CM | POA: Diagnosis present

## 2016-01-10 DIAGNOSIS — Z79899 Other long term (current) drug therapy: Secondary | ICD-10-CM | POA: Diagnosis not present

## 2016-01-10 DIAGNOSIS — K7460 Unspecified cirrhosis of liver: Secondary | ICD-10-CM | POA: Diagnosis present

## 2016-01-10 DIAGNOSIS — F329 Major depressive disorder, single episode, unspecified: Secondary | ICD-10-CM | POA: Diagnosis present

## 2016-01-10 DIAGNOSIS — K449 Diaphragmatic hernia without obstruction or gangrene: Secondary | ICD-10-CM | POA: Diagnosis present

## 2016-01-10 DIAGNOSIS — I4581 Long QT syndrome: Secondary | ICD-10-CM

## 2016-01-10 DIAGNOSIS — Z881 Allergy status to other antibiotic agents status: Secondary | ICD-10-CM

## 2016-01-10 DIAGNOSIS — R1110 Vomiting, unspecified: Secondary | ICD-10-CM

## 2016-01-10 DIAGNOSIS — G43A0 Cyclical vomiting, not intractable: Secondary | ICD-10-CM | POA: Diagnosis not present

## 2016-01-10 DIAGNOSIS — R0602 Shortness of breath: Secondary | ICD-10-CM | POA: Diagnosis not present

## 2016-01-10 DIAGNOSIS — Z79891 Long term (current) use of opiate analgesic: Secondary | ICD-10-CM | POA: Diagnosis not present

## 2016-01-10 DIAGNOSIS — Z8673 Personal history of transient ischemic attack (TIA), and cerebral infarction without residual deficits: Secondary | ICD-10-CM | POA: Diagnosis not present

## 2016-01-10 DIAGNOSIS — Z8542 Personal history of malignant neoplasm of other parts of uterus: Secondary | ICD-10-CM

## 2016-01-10 DIAGNOSIS — R112 Nausea with vomiting, unspecified: Secondary | ICD-10-CM | POA: Diagnosis present

## 2016-01-10 DIAGNOSIS — E119 Type 2 diabetes mellitus without complications: Secondary | ICD-10-CM | POA: Diagnosis not present

## 2016-01-10 DIAGNOSIS — Z887 Allergy status to serum and vaccine status: Secondary | ICD-10-CM

## 2016-01-10 DIAGNOSIS — F419 Anxiety disorder, unspecified: Secondary | ICD-10-CM | POA: Diagnosis present

## 2016-01-10 DIAGNOSIS — Z7951 Long term (current) use of inhaled steroids: Secondary | ICD-10-CM

## 2016-01-10 DIAGNOSIS — I214 Non-ST elevation (NSTEMI) myocardial infarction: Secondary | ICD-10-CM | POA: Diagnosis not present

## 2016-01-10 DIAGNOSIS — I480 Paroxysmal atrial fibrillation: Secondary | ICD-10-CM | POA: Diagnosis present

## 2016-01-10 DIAGNOSIS — J181 Lobar pneumonia, unspecified organism: Principal | ICD-10-CM

## 2016-01-10 DIAGNOSIS — M6289 Other specified disorders of muscle: Secondary | ICD-10-CM | POA: Diagnosis not present

## 2016-01-10 DIAGNOSIS — R1084 Generalized abdominal pain: Secondary | ICD-10-CM

## 2016-01-10 DIAGNOSIS — I10 Essential (primary) hypertension: Secondary | ICD-10-CM | POA: Diagnosis present

## 2016-01-10 DIAGNOSIS — J189 Pneumonia, unspecified organism: Secondary | ICD-10-CM | POA: Diagnosis not present

## 2016-01-10 DIAGNOSIS — R188 Other ascites: Secondary | ICD-10-CM | POA: Diagnosis not present

## 2016-01-10 DIAGNOSIS — E039 Hypothyroidism, unspecified: Secondary | ICD-10-CM | POA: Diagnosis present

## 2016-01-10 DIAGNOSIS — Z8249 Family history of ischemic heart disease and other diseases of the circulatory system: Secondary | ICD-10-CM

## 2016-01-10 DIAGNOSIS — I69354 Hemiplegia and hemiparesis following cerebral infarction affecting left non-dominant side: Secondary | ICD-10-CM | POA: Diagnosis not present

## 2016-01-10 DIAGNOSIS — G43A1 Cyclical vomiting, intractable: Secondary | ICD-10-CM | POA: Diagnosis not present

## 2016-01-10 DIAGNOSIS — Z794 Long term (current) use of insulin: Secondary | ICD-10-CM | POA: Diagnosis not present

## 2016-01-10 DIAGNOSIS — R4189 Other symptoms and signs involving cognitive functions and awareness: Secondary | ICD-10-CM | POA: Diagnosis not present

## 2016-01-10 DIAGNOSIS — I495 Sick sinus syndrome: Secondary | ICD-10-CM | POA: Diagnosis present

## 2016-01-10 DIAGNOSIS — R000 Tachycardia, unspecified: Secondary | ICD-10-CM

## 2016-01-10 DIAGNOSIS — Z66 Do not resuscitate: Secondary | ICD-10-CM | POA: Diagnosis present

## 2016-01-10 DIAGNOSIS — E108 Type 1 diabetes mellitus with unspecified complications: Secondary | ICD-10-CM | POA: Diagnosis present

## 2016-01-10 DIAGNOSIS — I471 Supraventricular tachycardia: Secondary | ICD-10-CM | POA: Diagnosis not present

## 2016-01-10 DIAGNOSIS — R079 Chest pain, unspecified: Secondary | ICD-10-CM | POA: Diagnosis not present

## 2016-01-10 DIAGNOSIS — Z888 Allergy status to other drugs, medicaments and biological substances status: Secondary | ICD-10-CM | POA: Diagnosis not present

## 2016-01-10 DIAGNOSIS — G8929 Other chronic pain: Secondary | ICD-10-CM | POA: Diagnosis present

## 2016-01-10 DIAGNOSIS — R0902 Hypoxemia: Secondary | ICD-10-CM | POA: Diagnosis not present

## 2016-01-10 DIAGNOSIS — I639 Cerebral infarction, unspecified: Secondary | ICD-10-CM

## 2016-01-10 DIAGNOSIS — K529 Noninfective gastroenteritis and colitis, unspecified: Secondary | ICD-10-CM | POA: Diagnosis not present

## 2016-01-10 HISTORY — DX: Tachycardia, unspecified: R00.0

## 2016-01-10 LAB — BASIC METABOLIC PANEL
ANION GAP: 11 (ref 5–15)
BUN: 13 mg/dL (ref 6–20)
CO2: 21 mmol/L — ABNORMAL LOW (ref 22–32)
Calcium: 9.1 mg/dL (ref 8.9–10.3)
Chloride: 106 mmol/L (ref 101–111)
Creatinine, Ser: 0.86 mg/dL (ref 0.44–1.00)
GLUCOSE: 203 mg/dL — AB (ref 65–99)
POTASSIUM: 3.5 mmol/L (ref 3.5–5.1)
SODIUM: 138 mmol/L (ref 135–145)

## 2016-01-10 LAB — TROPONIN I
TROPONIN I: 0.03 ng/mL (ref <0.031)
Troponin I: 0.03 ng/mL (ref <0.031)

## 2016-01-10 LAB — URINALYSIS COMPLETE WITH MICROSCOPIC (ARMC ONLY)
BACTERIA UA: NONE SEEN
BILIRUBIN URINE: NEGATIVE
LEUKOCYTES UA: NEGATIVE
NITRITE: NEGATIVE
Protein, ur: NEGATIVE mg/dL
SPECIFIC GRAVITY, URINE: 1.026 (ref 1.005–1.030)
pH: 5 (ref 5.0–8.0)

## 2016-01-10 LAB — URINE DRUG SCREEN, QUALITATIVE (ARMC ONLY)
Amphetamines, Ur Screen: NOT DETECTED
BARBITURATES, UR SCREEN: NOT DETECTED
BENZODIAZEPINE, UR SCRN: NOT DETECTED
Cannabinoid 50 Ng, Ur ~~LOC~~: NOT DETECTED
Cocaine Metabolite,Ur ~~LOC~~: NOT DETECTED
MDMA (ECSTASY) UR SCREEN: NOT DETECTED
Methadone Scn, Ur: NOT DETECTED
Opiate, Ur Screen: POSITIVE — AB
Phencyclidine (PCP) Ur S: NOT DETECTED
TRICYCLIC, UR SCREEN: NOT DETECTED

## 2016-01-10 LAB — CBC
HEMATOCRIT: 35.2 % (ref 35.0–47.0)
HEMOGLOBIN: 11.3 g/dL — AB (ref 12.0–16.0)
MCH: 27.9 pg (ref 26.0–34.0)
MCHC: 32 g/dL (ref 32.0–36.0)
MCV: 87.4 fL (ref 80.0–100.0)
Platelets: 142 10*3/uL — ABNORMAL LOW (ref 150–440)
RBC: 4.03 MIL/uL (ref 3.80–5.20)
RDW: 16.3 % — ABNORMAL HIGH (ref 11.5–14.5)
WBC: 6.9 10*3/uL (ref 3.6–11.0)

## 2016-01-10 LAB — LACTIC ACID, PLASMA
LACTIC ACID, VENOUS: 4.9 mmol/L — AB (ref 0.5–2.0)
LACTIC ACID, VENOUS: 4.1 mmol/L — AB (ref 0.5–2.0)

## 2016-01-10 LAB — HEPATIC FUNCTION PANEL
ALBUMIN: 3.9 g/dL (ref 3.5–5.0)
ALT: 24 U/L (ref 14–54)
AST: 29 U/L (ref 15–41)
Alkaline Phosphatase: 105 U/L (ref 38–126)
BILIRUBIN INDIRECT: 1.1 mg/dL — AB (ref 0.3–0.9)
BILIRUBIN TOTAL: 1.4 mg/dL — AB (ref 0.3–1.2)
Bilirubin, Direct: 0.3 mg/dL (ref 0.1–0.5)
Total Protein: 7.5 g/dL (ref 6.5–8.1)

## 2016-01-10 LAB — GLUCOSE, CAPILLARY: Glucose-Capillary: 297 mg/dL — ABNORMAL HIGH (ref 65–99)

## 2016-01-10 LAB — LIPASE, BLOOD: LIPASE: 22 U/L (ref 11–51)

## 2016-01-10 LAB — FIBRIN DERIVATIVES D-DIMER (ARMC ONLY): Fibrin derivatives D-dimer (ARMC): 3032 — ABNORMAL HIGH (ref 0–499)

## 2016-01-10 LAB — BRAIN NATRIURETIC PEPTIDE: B NATRIURETIC PEPTIDE 5: 247 pg/mL — AB (ref 0.0–100.0)

## 2016-01-10 MED ORDER — SODIUM CHLORIDE 0.9% FLUSH
3.0000 mL | Freq: Two times a day (BID) | INTRAVENOUS | Status: DC
  Administered 2016-01-10 – 2016-01-13 (×6): 3 mL via INTRAVENOUS

## 2016-01-10 MED ORDER — PANTOPRAZOLE SODIUM 40 MG PO TBEC
40.0000 mg | DELAYED_RELEASE_TABLET | Freq: Two times a day (BID) | ORAL | Status: DC
  Administered 2016-01-11 – 2016-01-13 (×4): 40 mg via ORAL
  Filled 2016-01-10 (×5): qty 1

## 2016-01-10 MED ORDER — AMLODIPINE BESYLATE 5 MG PO TABS
ORAL_TABLET | ORAL | Status: AC
  Administered 2016-01-10: 5 mg via ORAL
  Filled 2016-01-10: qty 1

## 2016-01-10 MED ORDER — METOPROLOL TARTRATE 25 MG PO TABS
25.0000 mg | ORAL_TABLET | Freq: Two times a day (BID) | ORAL | Status: DC
  Administered 2016-01-10 – 2016-01-11 (×2): 25 mg via ORAL
  Filled 2016-01-10 (×2): qty 1

## 2016-01-10 MED ORDER — SODIUM CHLORIDE 0.9 % IV SOLN
INTRAVENOUS | Status: DC
  Administered 2016-01-10 – 2016-01-12 (×5): via INTRAVENOUS

## 2016-01-10 MED ORDER — MORPHINE SULFATE (PF) 4 MG/ML IV SOLN
INTRAVENOUS | Status: AC
  Administered 2016-01-10: 4 mg via INTRAVENOUS
  Filled 2016-01-10: qty 1

## 2016-01-10 MED ORDER — BENZONATATE 100 MG PO CAPS: 200.0000 mg | ORAL_CAPSULE | Freq: Three times a day (TID) | ORAL | Status: DC | PRN

## 2016-01-10 MED ORDER — DICYCLOMINE HCL 10 MG PO CAPS
10.0000 mg | ORAL_CAPSULE | Freq: Three times a day (TID) | ORAL | Status: DC
  Administered 2016-01-11 – 2016-01-13 (×8): 10 mg via ORAL
  Filled 2016-01-10 (×9): qty 1

## 2016-01-10 MED ORDER — ONDANSETRON HCL 4 MG/2ML IJ SOLN
4.0000 mg | Freq: Once | INTRAMUSCULAR | Status: AC
  Administered 2016-01-10: 4 mg via INTRAVENOUS
  Filled 2016-01-10: qty 2

## 2016-01-10 MED ORDER — TIZANIDINE HCL 4 MG PO TABS: 4.0000 mg | ORAL_TABLET | Freq: Three times a day (TID) | ORAL | Status: DC | PRN

## 2016-01-10 MED ORDER — PROCHLORPERAZINE EDISYLATE 5 MG/ML IJ SOLN
10.0000 mg | INTRAMUSCULAR | Status: AC
  Administered 2016-01-10: 10 mg via INTRAVENOUS

## 2016-01-10 MED ORDER — MORPHINE SULFATE (PF) 4 MG/ML IV SOLN
6.0000 mg | Freq: Once | INTRAVENOUS | Status: AC
  Administered 2016-01-10: 6 mg via INTRAVENOUS
  Filled 2016-01-10: qty 2

## 2016-01-10 MED ORDER — HYDRALAZINE HCL 20 MG/ML IJ SOLN
10.0000 mg | Freq: Four times a day (QID) | INTRAMUSCULAR | Status: DC | PRN
  Administered 2016-01-10: 10 mg via INTRAVENOUS
  Filled 2016-01-10: qty 1

## 2016-01-10 MED ORDER — APIXABAN 5 MG PO TABS
5.0000 mg | ORAL_TABLET | Freq: Two times a day (BID) | ORAL | Status: DC
  Administered 2016-01-10: 5 mg via ORAL
  Filled 2016-01-10: qty 1

## 2016-01-10 MED ORDER — CITALOPRAM HYDROBROMIDE 20 MG PO TABS
20.0000 mg | ORAL_TABLET | Freq: Every day | ORAL | Status: DC
  Administered 2016-01-11: 20 mg via ORAL
  Filled 2016-01-10 (×3): qty 1

## 2016-01-10 MED ORDER — MORPHINE SULFATE (PF) 4 MG/ML IV SOLN
4.0000 mg | Freq: Once | INTRAVENOUS | Status: AC
  Administered 2016-01-10: 4 mg via INTRAVENOUS

## 2016-01-10 MED ORDER — METOCLOPRAMIDE HCL 10 MG PO TABS
5.0000 mg | ORAL_TABLET | Freq: Three times a day (TID) | ORAL | Status: DC
  Administered 2016-01-11 – 2016-01-13 (×8): 5 mg via ORAL
  Filled 2016-01-10 (×9): qty 1

## 2016-01-10 MED ORDER — ONDANSETRON HCL 4 MG PO TABS
4.0000 mg | ORAL_TABLET | Freq: Four times a day (QID) | ORAL | Status: DC | PRN
  Administered 2016-01-11 – 2016-01-13 (×3): 4 mg via ORAL
  Filled 2016-01-10 (×3): qty 1

## 2016-01-10 MED ORDER — RIFAXIMIN 550 MG PO TABS
550.0000 mg | ORAL_TABLET | Freq: Two times a day (BID) | ORAL | Status: DC
  Administered 2016-01-10 – 2016-01-13 (×5): 550 mg via ORAL
  Filled 2016-01-10 (×6): qty 1

## 2016-01-10 MED ORDER — MAGNESIUM OXIDE 400 (241.3 MG) MG PO TABS
400.0000 mg | ORAL_TABLET | Freq: Two times a day (BID) | ORAL | Status: DC
  Administered 2016-01-10 – 2016-01-13 (×5): 400 mg via ORAL
  Filled 2016-01-10 (×6): qty 1

## 2016-01-10 MED ORDER — OXYCODONE HCL 5 MG PO TABS
5.0000 mg | ORAL_TABLET | Freq: Four times a day (QID) | ORAL | Status: DC | PRN
  Administered 2016-01-10 – 2016-01-11 (×4): 10 mg via ORAL
  Administered 2016-01-13 (×2): 5 mg via ORAL
  Filled 2016-01-10: qty 1
  Filled 2016-01-10 (×3): qty 2
  Filled 2016-01-10: qty 1
  Filled 2016-01-10: qty 2

## 2016-01-10 MED ORDER — IOPAMIDOL (ISOVUE-370) INJECTION 76%
100.0000 mL | Freq: Once | INTRAVENOUS | Status: AC | PRN
  Administered 2016-01-10: 100 mL via INTRAVENOUS

## 2016-01-10 MED ORDER — ATORVASTATIN CALCIUM 20 MG PO TABS
40.0000 mg | ORAL_TABLET | Freq: Every day | ORAL | Status: DC
  Administered 2016-01-10 – 2016-01-12 (×3): 40 mg via ORAL
  Filled 2016-01-10 (×3): qty 2

## 2016-01-10 MED ORDER — LEVOTHYROXINE SODIUM 75 MCG PO TABS
75.0000 ug | ORAL_TABLET | Freq: Every day | ORAL | Status: DC
  Administered 2016-01-11: 75 ug via ORAL
  Filled 2016-01-10: qty 1

## 2016-01-10 MED ORDER — MORPHINE SULFATE (PF) 4 MG/ML IV SOLN
4.0000 mg | Freq: Once | INTRAVENOUS | Status: AC
  Administered 2016-01-10: 4 mg via INTRAVENOUS
  Filled 2016-01-10: qty 1

## 2016-01-10 MED ORDER — ACETAMINOPHEN 650 MG RE SUPP: 650.0000 mg | Freq: Four times a day (QID) | RECTAL | Status: DC | PRN

## 2016-01-10 MED ORDER — SODIUM CHLORIDE 0.9 % IV SOLN
Freq: Once | INTRAVENOUS | Status: AC
  Administered 2016-01-10: 16:00:00 via INTRAVENOUS

## 2016-01-10 MED ORDER — ALBUTEROL SULFATE (2.5 MG/3ML) 0.083% IN NEBU: 2.5000 mg | INHALATION_SOLUTION | Freq: Four times a day (QID) | RESPIRATORY_TRACT | Status: DC | PRN

## 2016-01-10 MED ORDER — AMLODIPINE BESYLATE 5 MG PO TABS
5.0000 mg | ORAL_TABLET | Freq: Every day | ORAL | Status: DC
  Administered 2016-01-11 – 2016-01-13 (×2): 5 mg via ORAL
  Filled 2016-01-10 (×3): qty 1

## 2016-01-10 MED ORDER — INSULIN ASPART 100 UNIT/ML ~~LOC~~ SOLN
0.0000 [IU] | Freq: Three times a day (TID) | SUBCUTANEOUS | Status: DC
  Administered 2016-01-11: 5 [IU] via SUBCUTANEOUS
  Filled 2016-01-10: qty 5

## 2016-01-10 MED ORDER — ACETAMINOPHEN 325 MG PO TABS
650.0000 mg | ORAL_TABLET | Freq: Four times a day (QID) | ORAL | Status: DC | PRN
  Administered 2016-01-13: 650 mg via ORAL
  Filled 2016-01-10: qty 2

## 2016-01-10 MED ORDER — MORPHINE SULFATE (PF) 2 MG/ML IV SOLN
2.0000 mg | INTRAVENOUS | Status: DC | PRN
  Administered 2016-01-10 – 2016-01-12 (×5): 2 mg via INTRAVENOUS
  Filled 2016-01-10 (×5): qty 1

## 2016-01-10 MED ORDER — ONDANSETRON HCL 4 MG/2ML IJ SOLN
4.0000 mg | Freq: Four times a day (QID) | INTRAMUSCULAR | Status: DC | PRN
  Administered 2016-01-11 – 2016-01-12 (×2): 4 mg via INTRAVENOUS
  Filled 2016-01-10 (×2): qty 2

## 2016-01-10 MED ORDER — MOMETASONE FURO-FORMOTEROL FUM 200-5 MCG/ACT IN AERO
2.0000 | INHALATION_SPRAY | Freq: Two times a day (BID) | RESPIRATORY_TRACT | Status: DC
  Administered 2016-01-10 – 2016-01-13 (×6): 2 via RESPIRATORY_TRACT
  Filled 2016-01-10: qty 8.8

## 2016-01-10 MED ORDER — DOCUSATE SODIUM 100 MG PO CAPS
100.0000 mg | ORAL_CAPSULE | Freq: Two times a day (BID) | ORAL | Status: DC
  Administered 2016-01-10 – 2016-01-13 (×5): 100 mg via ORAL
  Filled 2016-01-10 (×5): qty 1

## 2016-01-10 MED ORDER — INSULIN DETEMIR 100 UNIT/ML ~~LOC~~ SOLN
12.0000 [IU] | Freq: Every day | SUBCUTANEOUS | Status: DC
  Administered 2016-01-10: 12 [IU] via SUBCUTANEOUS
  Filled 2016-01-10 (×3): qty 0.12

## 2016-01-10 MED ORDER — AMLODIPINE BESYLATE 5 MG PO TABS
5.0000 mg | ORAL_TABLET | ORAL | Status: AC
  Administered 2016-01-10: 5 mg via ORAL

## 2016-01-10 MED ORDER — PROCHLORPERAZINE EDISYLATE 5 MG/ML IJ SOLN
INTRAMUSCULAR | Status: AC
  Administered 2016-01-10: 10 mg via INTRAVENOUS
  Filled 2016-01-10: qty 2

## 2016-01-10 MED ORDER — DIATRIZOATE MEGLUMINE & SODIUM 66-10 % PO SOLN
15.0000 mL | ORAL | Status: AC
  Administered 2016-01-10 (×2): 15 mL via ORAL
  Filled 2016-01-10 (×2): qty 30

## 2016-01-10 MED ORDER — DIATRIZOATE MEGLUMINE & SODIUM 66-10 % PO SOLN
15.0000 mL | Freq: Once | ORAL | Status: DC
  Filled 2016-01-10: qty 30

## 2016-01-10 MED ORDER — MORPHINE SULFATE ER 15 MG PO TBCR
15.0000 mg | EXTENDED_RELEASE_TABLET | Freq: Two times a day (BID) | ORAL | Status: DC
  Administered 2016-01-10 – 2016-01-12 (×4): 15 mg via ORAL
  Filled 2016-01-10 (×5): qty 1

## 2016-01-10 NOTE — Assessment & Plan Note (Addendum)
Showing multifocal pneumonia on her chest CT at the beginning of April. This seems to be a correlation with her colitis/diverticulitis/vomiting and her pneumonia episodes over the last 2-3 months. There also might be a component of obstructive lung disease given her shortness of breath and dyspnea on exertion along with chest palpitations and tightness. She also has a history of proximal atrial fibrillation which could be adding to her shortness of breath and palpitations.  Plan: -Continue with Symbicort 2puffs twice a day, gargle and rinse after each use. Repeat CT without contrast in 2 months Pulmonary function testing along with 6 minute walk test prior to follow-up visit in 2 months.

## 2016-01-10 NOTE — Assessment & Plan Note (Signed)
In office today with a heart rate of 130-140, along with nausea, vomiting, hypertension. She does have significant chest palpitations shortness of breath and chest tightness. Given the unstable nature of the patient, she was taken to the ER for further workup and evaluation.

## 2016-01-10 NOTE — ED Notes (Signed)
Dr. Marcille Blanco made aware of pt's BP, per Dr. Marcille Blanco, give pt's dose of amlodipine, okay for pt to go to room with elevated bp

## 2016-01-10 NOTE — Progress Notes (Signed)
[facility] Consultation      MRN# [id number] [name] [birth year]   CC: Chief Complaint  Patient presents with  . Hospitalization Follow-up    SOB at all times; chest pain in center and under left breast; palpatations; dry cough     HPI: She presents today for follow-up visit from hospitalization in early April for colitis and multifocal pneumonia. Since that hospitalization she has been admitted again for another episode of colitis/diverticulitis. At her hospitalization during the first week of April she was noted to have a CAT scan due to shortness of breath and cough along withwheezing, which showed multifocal pneumonia. Since that discharge she's been on Symbicort and prednisone Dosepak which she completed. Prior to this hospitalization she's had 2 previous episodes of suspected pneumonia along with shortness of breath and wheezing. He is also correlated with episodes of colitis. She states all in all over the past 2 months she's had 4 episodes of colitis/diverticulitis, coinciding with pneumonia. She endorses chronic shortness of breath and dyspnea on exertion even prior to 2 months ago. She was married 12 years ago, and is exposed to secondhand smoke from her husband and step kids since then. She has a history of proximal atrial fibrillation,1 diabetes and uterine cancer status post hysterectomy. Today she is noted to have tachycardia, shortness of breath, nausea, dry heaving. Patient stated and vomiting and nausea started today, but having episodes of palpitations and tachycardia for the last 2-3 days. She is also noted to have high blood pressure, given the unstable nature along with the tachyarrhythmias, I have discussed the ER admission for possible stat EKG and possible stabilization of tachyarrhythmia, which patient is in agreement with.   [facility] 4/21-4/23/23 60 year old female with multiple medical problems including chronic abdominal  pain, diabetes mellitus, hypothyroidism, paroxysmal atrial fibrillation, history of CVA and TIAs, uterine cancer, liver cirrhosis nonalcoholic admitted to the hospital secondary to left-sided weakness. Also has been having diarrhea.  #1 ascending colitis with septic shock-sepsis has resolved. - was on IV Zosyn. Change to augmentin at discharge, recently treated with flagyl - No further diarrhea -stable hb, no further rectal bleed, outpatient GI f/u  #2 paroxysmal atrial fibrillation with Tachy-bradycardia syndrome-known history of tachycardia but and bradycardic this admission. RESOLVED now -Appreciate cardiology consult. Heart rate is back to normal. -Eliquis restarted. Monitor hb as outpatient. Stable here -Continue low-dose metoprolol.  - Outpatient f/u with EP cardiologist Dr. Caryl Comes recommended  #3 chronic abdominal pain-continue chronic pain medications. Doing well now  #4 diabetes mellitus- continue home dose of levemir and novolog with meals.  #5 Left sided weakness- resolved. Was already on eliquis when had the symptoms - MRI and MRA of the brain are normal. - cont statin - Echo showed no thrombus. Patient seems to have functional weakness- resolved now -Appreciate neurology consult. No further recommendations.  #6 NASH - with Cirrhosis Stable  [facility] Hospitalization 12/16/15-12/19/15 60 year old female with past medical history significant for liver cirrhosis, gastroparesis, chronic back pain, GERD, anxiety and diabetes mellitus, history of paroxysmal atrial fibrillation presents to the hospital secondary to nausea vomiting and abdominal pain.  #1 Acute colitis-Improving, tolerating soft diet well today. -On Rocephin and Flagyl in the hospital, discharged on Flagyl. -Diarrhea has improved.  #2 Acute wheezing-no history of any smoking or passive smoke exposure. Diffuse pneumonitis on CT chest- recent admissions for pneumonia Denies any mold or occupational exposure. Discussed  with pulmonologist Dr. Stevenson Clinch and started steroid taper and outpatient f/u and repeat  CT in 3 months - Also added inhalers at discharge - CT chest with extensive findings including ultimate lobar consolidations, groundglass opacity suggesting atypical pneumonia or interstitial pneumonia.  #3 atrial fibrillation ad sinus tachycardia-continue metoprolol. -On eliquis for anticoagulation  #4 liver cirrhosis-stable. No ascites on exam. Continue rifaximin  #5 gastroparesis- on reglan and also Also on Bentyl for chronic abdominal pain.  #6 diabetes mellitus- restart home dose of lantus  #7 hypertension-on metoprolol, lisinopril and also Norvasc.  Patient clinically improved. Being discharged today Has home o2.  Medication:   Current Outpatient Rx  Name  Route  Sig  Dispense  Refill  . albuterol (PROVENTIL HFA;VENTOLIN HFA) 108 (90 Base) MCG/ACT inhaler   Inhalation   Inhale 2 puffs into the lungs every 6 (six) hours as needed for wheezing or shortness of breath.   1 Inhaler   2   . amLODipine (NORVASC) 5 MG tablet   Oral   Take 5 mg by mouth daily.         Marland Kitchen apixaban (ELIQUIS) 5 MG TABS tablet   Oral   Take 1 tablet (5 mg total) by mouth 2 (two) times daily.   60 tablet   0   . atorvastatin (LIPITOR) 40 MG tablet   Oral   Take 1 tablet (40 mg total) by mouth daily at 6 PM.   30 tablet   2   . benzonatate (TESSALON) 200 MG capsule   Oral   Take 1 capsule (200 mg total) by mouth 3 (three) times daily as needed for cough.   20 capsule   0   . budesonide-formoterol (SYMBICORT) 160-4.5 MCG/ACT inhaler   Inhalation   Inhale 2 puffs into the lungs 2 (two) times daily.   1 Inhaler   12   . citalopram (CELEXA) 20 MG tablet   Oral   Take 20 mg by mouth daily.         Marland Kitchen dicyclomine (BENTYL) 10 MG capsule   Oral   Take 10 mg by mouth 4 (four) times daily - after meals and at bedtime.          . insulin aspart (NOVOLOG) 100 UNIT/ML injection   Subcutaneous   Inject  into the skin 3 (three) times daily with meals as needed for high blood sugar. Pt uses as needed per sliding scale.         . insulin detemir (LEVEMIR) 100 UNIT/ML injection   Subcutaneous   Inject 17 Units into the skin at bedtime.         Marland Kitchen levothyroxine (SYNTHROID, LEVOTHROID) 75 MCG tablet   Oral   Take 1 tablet (75 mcg total) by mouth daily before breakfast.   30 tablet   1   . magnesium oxide (MAG-OX) 400 (241.3 Mg) MG tablet   Oral   Take 1 tablet (400 mg total) by mouth 2 (two) times daily.   10 tablet   0   . metoCLOPramide (REGLAN) 5 MG tablet   Oral   Take 1 tablet (5 mg total) by mouth 4 (four) times daily -  before meals and at bedtime.   90 tablet   0   . metoprolol tartrate (LOPRESSOR) 25 MG tablet   Oral   Take 25 mg by mouth 2 (two) times daily.          Marland Kitchen morphine (MS CONTIN) 15 MG 12 hr tablet   Oral   Take 1 tablet (15 mg total) by mouth every 8 (eight)  hours.   75 tablet   0   . oxyCODONE (OXY IR/ROXICODONE) 5 MG immediate release tablet   Oral   Take 5-10 mg by mouth every 6 (six) hours as needed for severe pain.         . pantoprazole (PROTONIX) 40 MG tablet   Oral   Take 40 mg by mouth 2 (two) times daily before a meal.         . rifaximin (XIFAXAN) 550 MG TABS tablet   Oral   Take 1 tablet (550 mg total) by mouth 2 (two) times daily.   60 tablet   6   . tiZANidine (ZANAFLEX) 4 MG tablet   Oral   Take 4 mg by mouth 3 (three) times daily as needed for muscle spasms.             Review of Systems  Constitutional: Negative for fever and chills.  HENT: Negative for congestion, ear discharge, nosebleeds and sore throat.   Eyes: Negative for blurred vision and double vision.  Respiratory: Positive for shortness of breath.   Cardiovascular: Positive for chest pain and palpitations.  Gastrointestinal: Positive for nausea, vomiting, abdominal pain and diarrhea. Negative for blood in stool and melena.  Genitourinary: Negative for  dysuria.  Musculoskeletal: Negative for myalgias.  Neurological: Positive for dizziness.  Endo/Heme/Allergies: Does not bruise/bleed easily.  Psychiatric/Behavioral: Negative for depression.      Allergies:  Hydrocodone; Erythromycin; Prednisone; Rosiglitazone maleate; Codeine sulfate; and Tetanus-diphtheria toxoids td  Physical Examination:  VS: BP 146/100 mmHg  Pulse 130  Ht 5' 3"  (1.6 m)  Wt 131 lb (59.421 kg)  BMI 23.21 kg/m2  SpO2 92%  General Appearance: No distress  HEENT: PERRLA, no ptosis, no other lesions noticed Pulmonary:normal breath sounds., diaphragmatic excursion normal.No wheezing, No rales   Cardiovascular:  21, s2, tachycardia  Abdomen:Exam: Benign, Soft, non-tender, No masses  Skin:   warm, no rashes, no ecchymosis  Extremities: normal, no cyanosis, clubbing, warm with normal capillary refill.      Rad results: (The following images and results were reviewed by Dr. Stevenson Clinch). CT Chest April 2017 CT CHEST WITHOUT CONTRAST  TECHNIQUE: Multidetector CT imaging of the chest was performed following the standard protocol without IV contrast.  COMPARISON: Chest x-rays dated 12/17/2015 and 11/14/2015.  FINDINGS: Mediastinum/Lymph Nodes: Heart size is upper normal. No pericardial effusion. Scattered coronary artery calcifications noted. No masses or enlarged lymph nodes seen within the mediastinum or perihilar regions.  Lungs/Pleura: Patchy consolidations throughout both lungs, involving all lobes, most dense within the right upper lobe and at the left lung base posteriorly. Majority of the consolidations are associated with ground-glass opacities suggesting atypical pneumonia or associated edema.  Trachea and central bronchi are unremarkable.  Upper abdomen: Free ascites is seen within the upper abdomen. Liver appears cirrhotic. Patient is status post cholecystectomy. Thickening of the walls of the upper right colon appear similar to the  appearance on CT abdomen of 12/16/2015 compatible with the description of colitis on the associated report.  Musculoskeletal: Mild scoliosis of the thoracic spine. No evidence of acute osseous abnormality. Superficial soft tissues are unremarkable.  IMPRESSION: 1. Multi lobar consolidations, most dense within the right upper lobe and at the left lung base posteriorly, with areas of associated ground-glass opacity suggesting atypical pneumonia and/or edema. Differential includes atypical pneumonias such as viral or fungal, interstitial pneumonias, edema related to volume overload/CHF, chronic interstitial diseases, hypersensitivity pneumonitis, and respiratory bronchiolitis. 2. Upper abdominal ascites, presumably associated with liver  cirrhosis. Thickening of the walls of the upper ascending colon similar to its appearance on CT abdomen of 12/16/2015, compatible with the previous description of colitis on the earlier CT report.    Assessment and Plan: Pneumonia Showing multifocal pneumonia on her chest CT at the beginning of April. This seems to be a correlation with her colitis/diverticulitis/vomiting and her pneumonia episodes over the last 2-3 months. There also might be a component of obstructive lung disease given her shortness of breath and dyspnea on exertion along with chest palpitations and tightness. She also has a history of proximal atrial fibrillation which could be adding to her shortness of breath and palpitations.  Plan: -Continue with Symbicort 2puffs twice a day, gargle and rinse after each use. Repeat CT without contrast in 2 months Pulmonary function testing along with 6 minute walk test prior to follow-up visit in 2 months.  Tachyarrhythmia In office today with a heart rate of 130-140, along with nausea, vomiting, hypertension. She does have significant chest palpitations shortness of breath and chest tightness. Given the unstable nature of the patient, she  was taken to the ER for further workup and evaluation.     Updated Medication List Outpatient Encounter Prescriptions as of 01/10/2016  Medication Sig  . albuterol (PROVENTIL HFA;VENTOLIN HFA) 108 (90 Base) MCG/ACT inhaler Inhale 2 puffs into the lungs every 6 (six) hours as needed for wheezing or shortness of breath.  Marland Kitchen amLODipine (NORVASC) 5 MG tablet Take 5 mg by mouth daily.  Marland Kitchen apixaban (ELIQUIS) 5 MG TABS tablet Take 1 tablet (5 mg total) by mouth 2 (two) times daily.  Marland Kitchen atorvastatin (LIPITOR) 40 MG tablet Take 1 tablet (40 mg total) by mouth daily at 6 PM.  . benzonatate (TESSALON) 200 MG capsule Take 1 capsule (200 mg total) by mouth 3 (three) times daily as needed for cough.  . budesonide-formoterol (SYMBICORT) 160-4.5 MCG/ACT inhaler Inhale 2 puffs into the lungs 2 (two) times daily.  . citalopram (CELEXA) 20 MG tablet Take 20 mg by mouth daily.  Marland Kitchen dicyclomine (BENTYL) 10 MG capsule Take 10 mg by mouth 4 (four) times daily - after meals and at bedtime.   . insulin aspart (NOVOLOG) 100 UNIT/ML injection Inject into the skin 3 (three) times daily with meals as needed for high blood sugar. Pt uses as needed per sliding scale.  . insulin detemir (LEVEMIR) 100 UNIT/ML injection Inject 17 Units into the skin at bedtime.  Marland Kitchen levothyroxine (SYNTHROID, LEVOTHROID) 75 MCG tablet Take 1 tablet (75 mcg total) by mouth daily before breakfast.  . magnesium oxide (MAG-OX) 400 (241.3 Mg) MG tablet Take 1 tablet (400 mg total) by mouth 2 (two) times daily.  . metoCLOPramide (REGLAN) 5 MG tablet Take 1 tablet (5 mg total) by mouth 4 (four) times daily -  before meals and at bedtime.  . metoprolol tartrate (LOPRESSOR) 25 MG tablet Take 25 mg by mouth 2 (two) times daily.   Marland Kitchen morphine (MS CONTIN) 15 MG 12 hr tablet Take 1 tablet (15 mg total) by mouth every 8 (eight) hours.  Marland Kitchen oxyCODONE (OXY IR/ROXICODONE) 5 MG immediate release tablet Take 5-10 mg by mouth every 6 (six) hours as needed for severe pain.  .  pantoprazole (PROTONIX) 40 MG tablet Take 40 mg by mouth 2 (two) times daily before a meal.  . rifaximin (XIFAXAN) 550 MG TABS tablet Take 1 tablet (550 mg total) by mouth 2 (two) times daily.  Marland Kitchen tiZANidine (ZANAFLEX) 4 MG tablet Take 4 mg by  mouth 3 (three) times daily as needed for muscle spasms.   . [DISCONTINUED] amoxicillin-clavulanate (AUGMENTIN) 875-125 MG tablet Take 1 tablet by mouth 2 (two) times daily. X 5 more days   No facility-administered encounter medications on file as of 01/10/2016.    Orders for this visit: No orders of the defined types were placed in this encounter.    Thank  you for the visitation and for allowing  [facility] to assist in the care of your patient. Our recommendations are noted above.  Please contact us if we can be of further service.  Vilinda Boehringer, MD [facility] Office Number: [phone]  Note: This note was prepared with Dragon dictation along with smaller phrase technology. Any transcriptional errors that result from this process are unintentional.

## 2016-01-10 NOTE — Patient Instructions (Addendum)
Follow up with Dr. Stevenson Clinch in:2 months - we will sending you to the ER  - Repeat CT chest without contrast for multifocal pneumonia - avoid second hand smoking as much as possible - See Cardiology for tachycardia and palpitations - cont with symbicort - 2 puff BID, gargle and rinse after each use.

## 2016-01-10 NOTE — ED Notes (Signed)
Pt has not vomited or had dry heaves since last medication.

## 2016-01-10 NOTE — ED Notes (Signed)
Pt to ed sent from [facility] office for tachycardia. Pt reports chest pain x 3 days.  +sob, +weakness, denies diaphoresis

## 2016-01-10 NOTE — ED Provider Notes (Signed)
[facility] Emergency Department Provider Note   ____________________________________________  Time seen: Approximately 4:00 PM  I have reviewed the triage vital signs and the nursing notes.   HISTORY  Chief Complaint Tachycardia   HPI [name] is a 60 y.o. female patient reports nausea vomiting and diarrhea for about 2 days. She says she's also had chest pain intermittently for about 2 days she cannot describe the chest pain at this point. She denies any fever she denies any blood in the vomitus or diarrhea. He says her belly is hurting a lot. Moderately severe I would sign the pain at. She is tachycardic and looks uncomfortable. He says she was at the pulmonary doctors office and they sent her to the ER with a couldn't get her heart rate to come down. Nothing she's tried seems to make symptoms any better. Past Medical History  Diagnosis Date  . Type 1 diabetes ([place])   . Hypothyroid   . Degenerative disk disease   . Stomach ulcer   . Diverticulitis   . Syncope 01/2015  . Anxiety   . GERD (gastroesophageal reflux disease)   . History of hiatal hernia   . Cancer (HCC)     HX OF CANCER OF UTERUS   . TIA (transient ischemic attack) 02/2015  . Hypertension   . Pancreatitis   . PAF (paroxysmal atrial fibrillation) ([place]) 03/2015    a. new onset 03/2015 in setting of intractable N/V; b. on Eliquis 5 mg bid; c. CHADSVASc 4 (DM, TIA x 2, female)  . Intussusception intestine ([place]) 05/2015  . Stroke ([place])   . Allergy   . Cirrhosis of liver not due to alcohol [facility]) 2016    Patient Active Problem List   Diagnosis Date Noted  . Tachyarrhythmia 01/10/2016  . Tachy-brady syndrome ([place]) 12/28/2015  . Abdominal pain 12/27/2015  . Colitis 12/16/2015  . Pneumonia 11/14/2015  . Narcotic withdrawal ([place]) 11/11/2015  . Diabetes mellitus type 2, insulin dependent ([place]) 11/11/2015  . Orthostatic hypotension   . H/O TIA (transient ischemic attack) and stroke     . Left-sided weakness 10/04/2015  . Expressive aphasia 10/04/2015  . Ileus ([place]) 08/01/2015  . Ascites   . Cryptogenic cirrhosis ([place]) 07/10/2015  . Paroxysmal atrial fibrillation ([place]) 07/10/2015  . C. difficile colitis 07/10/2015  . GI (gastrointestinal bleed) 07/06/2015  . Malnutrition of moderate degree 07/04/2015  . Symptomatic bradycardia 05/27/2015  . Intussusception intestine ([place])   . Hypothyroidism due to amiodarone   . Abdominal pain, chronic, epigastric   . Chronic anemia 05/19/2015  . Thrombocytopenia ([place]) 05/19/2015  . Prolonged QT interval   . Hypomagnesemia   . Narcotic abuse   . Type 2 diabetes mellitus without complication ([place])   . Syncope due to orthostatic hypotension 05/18/2015  . Hypokalemia 04/06/2015  . Hyperlipidemia with target LDL less than 100 04/06/2015  . CVA (cerebral infarction) 02/15/2015  . Essential hypertension 01/12/2015  . Chronically on opiate therapy 01/12/2015  . Gastroparesis 01/12/2015  . DEPRESSION/ANXIETY 06/27/2007  . MYOFASCIAL PAIN SYNDROME 06/27/2007  . Chronic pain syndrome 03/28/2007  . GERD 03/27/2007  . DIVERTICULOSIS, COLON 03/27/2007  . LUMBAR DISC DISPLACEMENT 03/27/2007  . PROTEINURIA 03/27/2007  . UTERINE CANCER, HX OF 03/27/2007    Past Surgical History  Procedure Laterality Date  . Hernia repair    . Abdominal hysterectomy    . Cholecystectomy    . Esophagogastroduodenoscopy N/A 04/04/2015    Procedure: ESOPHAGOGASTRODUODENOSCOPY (EGD);  Surgeon: Hulen Luster,  MD;  Location: [facility] ENDOSCOPY;  Service: Endoscopy;  Laterality: N/A;    Current Outpatient Rx  Name  Route  Sig  Dispense  Refill  . albuterol (PROVENTIL HFA;VENTOLIN HFA) 108 (90 Base) MCG/ACT inhaler   Inhalation   Inhale 2 puffs into the lungs every 6 (six) hours as needed for wheezing or shortness of breath.   1 Inhaler   2   . amLODipine (NORVASC) 5 MG tablet   Oral   Take 5 mg by mouth daily.         Marland Kitchen apixaban (ELIQUIS) 5 MG TABS  tablet   Oral   Take 1 tablet (5 mg total) by mouth 2 (two) times daily.   60 tablet   0   . atorvastatin (LIPITOR) 40 MG tablet   Oral   Take 40 mg by mouth at bedtime.         . benzonatate (TESSALON) 200 MG capsule   Oral   Take 1 capsule (200 mg total) by mouth 3 (three) times daily as needed for cough.   20 capsule   0   . budesonide-formoterol (SYMBICORT) 160-4.5 MCG/ACT inhaler   Inhalation   Inhale 2 puffs into the lungs 2 (two) times daily.   1 Inhaler   12   . citalopram (CELEXA) 20 MG tablet   Oral   Take 20 mg by mouth daily.         Marland Kitchen dicyclomine (BENTYL) 10 MG capsule   Oral   Take 10 mg by mouth 4 (four) times daily -  before meals and at bedtime.          . insulin aspart (NOVOLOG) 100 UNIT/ML injection   Subcutaneous   Inject into the skin 3 (three) times daily with meals as needed for high blood sugar. Pt uses as needed per sliding scale.         . insulin detemir (LEVEMIR) 100 UNIT/ML injection   Subcutaneous   Inject 17 Units into the skin at bedtime.         Marland Kitchen levothyroxine (SYNTHROID, LEVOTHROID) 75 MCG tablet   Oral   Take 1 tablet (75 mcg total) by mouth daily before breakfast.   30 tablet   1   . magnesium oxide (MAG-OX) 400 (241.3 Mg) MG tablet   Oral   Take 1 tablet (400 mg total) by mouth 2 (two) times daily.   10 tablet   0   . metoCLOPramide (REGLAN) 5 MG tablet   Oral   Take 1 tablet (5 mg total) by mouth 4 (four) times daily -  before meals and at bedtime.   90 tablet   0   . metoprolol tartrate (LOPRESSOR) 25 MG tablet   Oral   Take 25 mg by mouth 2 (two) times daily.          Marland Kitchen morphine (MS CONTIN) 15 MG 12 hr tablet   Oral   Take 1 tablet (15 mg total) by mouth every 8 (eight) hours.   75 tablet   0   . oxyCODONE (OXY IR/ROXICODONE) 5 MG immediate release tablet   Oral   Take 5-10 mg by mouth every 6 (six) hours as needed for severe pain.         . pantoprazole (PROTONIX) 40 MG tablet   Oral    Take 40 mg by mouth 2 (two) times daily before a meal.         . rifaximin (XIFAXAN) 550 MG TABS tablet  Oral   Take 1 tablet (550 mg total) by mouth 2 (two) times daily.   60 tablet   6   . tiZANidine (ZANAFLEX) 4 MG tablet   Oral   Take 4 mg by mouth 3 (three) times daily as needed for muscle spasms.            Allergies Hydrocodone; Erythromycin; Prednisone; Rosiglitazone maleate; Codeine sulfate; and Tetanus-diphtheria toxoids td  Family History  Problem Relation Age of Onset  . Hypertension Mother   . CAD Sister   . Heart attack Sister     Deceased 11/14/2014  . CAD Brother     Social History Social History  Substance Use Topics  . Smoking status: Never Smoker   . Smokeless tobacco: Never Used  . Alcohol Use: No    Review of Systems Constitutional: No fever/chills Eyes: No visual changes. ENT: No sore throat. Cardiovascular: chest pain. Respiratory: Denies shortness of breath. Gastrointestinal: See history of present illness Genitourinary: Negative for dysuria. Musculoskeletal: Negative for back pain. Skin: Negative for rash. Neurological: Negative for headaches, focal weakness or numbness.  10-point ROS otherwise negative.  ____________________________________________   PHYSICAL EXAM:  VITAL SIGNS: ED Triage Vitals  Enc Vitals Group     BP 01/10/16 1444 172/83 mmHg     Pulse Rate 01/10/16 1444 111     Resp 01/10/16 1444 20     Temp 01/10/16 1444 98.5 F (36.9 C)     Temp Source 01/10/16 1444 Oral     SpO2 01/10/16 1444 99 %     Weight 01/10/16 1444 131 lb (59.421 kg)     Height 01/10/16 1444 5' 3"  (1.6 m)     Head Cir --      Peak Flow --      Pain Score 01/10/16 1444 10     Pain Loc --      Pain Edu? --      Excl. in [place]? --    Constitutional: Alert and oriented.Looks ill and uncomfortable Eyes: Conjunctivae are normal. PERRL. EOMI. Head: Atraumatic. Nose: No congestion/rhinnorhea. Mouth/Throat: Mucous membranes are moist.  Oropharynx  non-erythematous. Neck: No stridor.  Cardiovascular: Normal rate, regular rhythm. Grossly normal heart sounds.  Good peripheral circulation. Respiratory: Normal respiratory effort.  No retractions. Lungs CTAB. Gastrointestinal: Soft diffusely moderately tender no bowel sounds are auscultated. No distention. No abdominal bruits. No CVA tenderness. Musculoskeletal: No lower extremity tenderness nor edema.  No joint effusions. Neurologic:  Normal speech and language. No gross focal neurologic deficits are appreciated. No gait instability. Skin:  Skin is warm, dry and intact. No rash noted. Psychiatric: Mood and affect are normal. Speech and behavior are normal.  ____________________________________________   LABS (all labs ordered are listed, but only abnormal results are displayed)  Labs Reviewed  BASIC METABOLIC PANEL - Abnormal; Notable for the following:    CO2 21 (*)    Glucose, Bld 203 (*)    All other components within normal limits  CBC - Abnormal; Notable for the following:    Hemoglobin 11.3 (*)    RDW 16.3 (*)    Platelets 142 (*)    All other components within normal limits  LACTIC ACID, PLASMA - Abnormal; Notable for the following:    Lactic Acid, Venous 4.9 (*)    All other components within normal limits  BRAIN NATRIURETIC PEPTIDE - Abnormal; Notable for the following:    B Natriuretic Peptide 247.0 (*)    All other components within normal limits  FIBRIN  DERIVATIVES D-DIMER ([facility] ONLY) - Abnormal; Notable for the following:    Fibrin derivatives D-dimer [facility]) 3032 (*)    All other components within normal limits  HEPATIC FUNCTION PANEL - Abnormal; Notable for the following:    Total Bilirubin 1.4 (*)    Indirect Bilirubin 1.1 (*)    All other components within normal limits  CULTURE, BLOOD (ROUTINE X 2)  CULTURE, BLOOD (ROUTINE X 2)  TROPONIN I  LIPASE, BLOOD  TROPONIN I  LACTIC ACID, PLASMA  URINALYSIS COMPLETEWITH MICROSCOPIC ([facility] ONLY)  URINE DRUG  SCREEN, QUALITATIVE ([facility] ONLY)   ____________________________________________  EKG  EKG #1 read and interpreted by me shows sinus tachycardia rate of 109 left axis there is flattening of the T waves in 1 and L and some ST segment depression in those leads. There is a suggestion of ST elevation in 3 and F.  EKG #2 read and interpreted by me shows tach sinus tachycardia at 108 normal axis T waves all look okay and the swelling in the ST elevations gone.  I should note the computer is very worried about her previous diagnosis of QTC problems with a low potassium and low magnesium of patient's QT C on both of these EKGs is within normal limits ________________________________________  RADIOLOGY  CT of the chest read by radiology as no pulmonary emboli CT of the abdomen read by radiology essentially as no acute disease there is some cirrhosis and ascites but nothing else going on. ____________________________________________   PROCEDURES  Critical care time 20 minutes includes checking the patient before each one of her numerous doses of morphine and antiemetic  ____________________________________________   INITIAL IMPRESSION / ASSESSMENT AND PLAN / ED COURSE  Pertinent labs & imaging results that were available during my care of the patient were reviewed by me and considered in my medical decision making (see chart for details).  ____________________________________________   FINAL CLINICAL IMPRESSION(S) / ED DIAGNOSES  Final diagnoses:  Intractable vomiting with nausea, vomiting of unspecified type  Generalized abdominal pain      NEW MEDICATIONS STARTED DURING THIS VISIT:  New Prescriptions   No medications on file     Note:  This document was prepared using Dragon voice recognition software and may include unintentional dictation errors.    Nena Polio, MD 01/10/16 2016

## 2016-01-10 NOTE — Progress Notes (Signed)
Pt admitted to room 243. A&Ox4, hypertensive, other VS stable, c/o generalized abdominal pain 10/10. Pain medication and PO antihypertensive given. Pt oriented to room and unit, educated on call bell, telephone, bed alarm, and need to call nursing before up out of bed. Pt verbalized agreement and signed safety contract. RN will continue to monitor and treat per MD orders. Rachael Fee, RN

## 2016-01-10 NOTE — ED Notes (Signed)
Pt states unable to give urine sample at this time

## 2016-01-10 NOTE — H&P (Signed)
[name] is an 60 y.o. female.   Chief Complaint: Chest pain HPI: The patient presents emergency department from her pulmonology office after complaining of chest pain and palpitations during her visit. She also notes multiple episodes of nonbloody nonbilious emesis. And her pulmonologist office the patient's heart rate was found to be in the 130 to 140s. Upon arrival in the emergency department, her heart rate remained high and the patient was found to be orthostatic. To her chest pain had subsided she was unable to hold down oral intake and emergency department staff called the hospitalist service for further management.  Past Medical History  Diagnosis Date  . Type 1 diabetes ([place])   . Hypothyroid   . Degenerative disk disease   . Stomach ulcer   . Diverticulitis   . Syncope 01/2015  . Anxiety   . GERD (gastroesophageal reflux disease)   . History of hiatal hernia   . Cancer (HCC)     HX OF CANCER OF UTERUS   . TIA (transient ischemic attack) 02/2015  . Hypertension   . Pancreatitis   . PAF (paroxysmal atrial fibrillation) ([place]) 03/2015    a. new onset 03/2015 in setting of intractable N/V; b. on Eliquis 5 mg bid; c. CHADSVASc 4 (DM, TIA x 2, female)  . Intussusception intestine ([place]) 05/2015  . Stroke ([place])   . Allergy   . Cirrhosis of liver not due to alcohol ([place]) December 02, 2014    Past Surgical History  Procedure Laterality Date  . Hernia repair    . Abdominal hysterectomy    . Cholecystectomy    . Esophagogastroduodenoscopy N/A 04/04/2015    Procedure: ESOPHAGOGASTRODUODENOSCOPY (EGD);  Surgeon: Hulen Luster, MD;  Location: [facility] ENDOSCOPY;  Service: Endoscopy;  Laterality: N/A;    Family History  Problem Relation Age of Onset  . Hypertension Mother   . CAD Sister   . Heart attack Sister     Deceased 2014-12-02  . CAD Brother    Social History:  reports that she has never smoked. She has never used smokeless tobacco. She reports that she does not drink alcohol or use illicit  drugs.  Allergies:  Allergies  Allergen Reactions  . Hydrocodone Other (See Comments)    Pt states that this medication caused cirrhosis of the liver.    . Erythromycin Other (See Comments)    Reaction:  Fever   . Prednisone Other (See Comments)    Reaction:  Unknown   . Rosiglitazone Maleate Swelling  . Codeine Sulfate Rash  . Tetanus-Diphtheria Toxoids Td Rash and Other (See Comments)    Reaction:  Fever     Prior to Admission medications   Medication Sig Start Date End Date Taking? Authorizing Provider  albuterol (PROVENTIL HFA;VENTOLIN HFA) 108 (90 Base) MCG/ACT inhaler Inhale 2 puffs into the lungs every 6 (six) hours as needed for wheezing or shortness of breath. 12/19/15  Yes Gladstone Lighter, MD  amLODipine (NORVASC) 5 MG tablet Take 5 mg by mouth daily.   Yes Historical Provider, MD  apixaban (ELIQUIS) 5 MG TABS tablet Take 1 tablet (5 mg total) by mouth 2 (two) times daily. 04/07/15  Yes Bettey Costa, MD  atorvastatin (LIPITOR) 40 MG tablet Take 40 mg by mouth at bedtime.   Yes Historical Provider, MD  benzonatate (TESSALON) 200 MG capsule Take 1 capsule (200 mg total) by mouth 3 (three) times daily as needed for cough. 12/19/15  Yes Gladstone Lighter, MD  budesonide-formoterol (SYMBICORT) 160-4.5 MCG/ACT inhaler Inhale 2  puffs into the lungs 2 (two) times daily. 12/19/15  Yes Gladstone Lighter, MD  citalopram (CELEXA) 20 MG tablet Take 20 mg by mouth daily.   Yes Historical Provider, MD  dicyclomine (BENTYL) 10 MG capsule Take 10 mg by mouth 4 (four) times daily -  before meals and at bedtime.    Yes Historical Provider, MD  insulin aspart (NOVOLOG) 100 UNIT/ML injection Inject into the skin 3 (three) times daily with meals as needed for high blood sugar. Pt uses as needed per sliding scale.   Yes Historical Provider, MD  insulin detemir (LEVEMIR) 100 UNIT/ML injection Inject 17 Units into the skin at bedtime.   Yes Historical Provider, MD  levothyroxine (SYNTHROID, LEVOTHROID) 75  MCG tablet Take 1 tablet (75 mcg total) by mouth daily before breakfast. 10/08/15  Yes Loleta Chance, MD  magnesium oxide (MAG-OX) 400 (241.3 Mg) MG tablet Take 1 tablet (400 mg total) by mouth 2 (two) times daily. 11/16/15  Yes Belkys A Regalado, MD  metoCLOPramide (REGLAN) 5 MG tablet Take 1 tablet (5 mg total) by mouth 4 (four) times daily -  before meals and at bedtime. 12/19/15  Yes Gladstone Lighter, MD  metoprolol tartrate (LOPRESSOR) 25 MG tablet Take 25 mg by mouth 2 (two) times daily.    Yes Historical Provider, MD  morphine (MS CONTIN) 15 MG 12 hr tablet Take 1 tablet (15 mg total) by mouth every 8 (eight) hours. 10/21/15  Yes Lance Bosch, MD  oxyCODONE (OXY IR/ROXICODONE) 5 MG immediate release tablet Take 5-10 mg by mouth every 6 (six) hours as needed for severe pain.   Yes Historical Provider, MD  pantoprazole (PROTONIX) 40 MG tablet Take 40 mg by mouth 2 (two) times daily before a meal.   Yes Historical Provider, MD  rifaximin (XIFAXAN) 550 MG TABS tablet Take 1 tablet (550 mg total) by mouth 2 (two) times daily. 07/11/15  Yes Theodoro Grist, MD  tiZANidine (ZANAFLEX) 4 MG tablet Take 4 mg by mouth 3 (three) times daily as needed for muscle spasms.    Yes Historical Provider, MD     Results for orders placed or performed during the hospital encounter of 01/10/16 (from the past 48 hour(s))  Basic metabolic panel     Status: Abnormal   Collection Time: 01/10/16  3:13 PM  Result Value Ref Range   Sodium 138 135 - 145 mmol/L   Potassium 3.5 3.5 - 5.1 mmol/L   Chloride 106 101 - 111 mmol/L   CO2 21 (L) 22 - 32 mmol/L   Glucose, Bld 203 (H) 65 - 99 mg/dL   BUN 13 6 - 20 mg/dL   Creatinine, Ser 0.86 0.44 - 1.00 mg/dL   Calcium 9.1 8.9 - 10.3 mg/dL   GFR calc non Af Amer >60 >60 mL/min   GFR calc Af Amer >60 >60 mL/min    Comment: (NOTE) The eGFR has been calculated using the CKD EPI equation. This calculation has not been validated in all clinical situations. eGFR's persistently <60  mL/min signify possible Chronic Kidney Disease.    Anion gap 11 5 - 15  CBC     Status: Abnormal   Collection Time: 01/10/16  3:13 PM  Result Value Ref Range   WBC 6.9 3.6 - 11.0 K/uL   RBC 4.03 3.80 - 5.20 MIL/uL   Hemoglobin 11.3 (L) 12.0 - 16.0 g/dL   HCT 35.2 35.0 - 47.0 %   MCV 87.4 80.0 - 100.0 fL   MCH 27.9 26.0 -  34.0 pg   MCHC 32.0 32.0 - 36.0 g/dL   RDW 16.3 (H) 11.5 - 14.5 %   Platelets 142 (L) 150 - 440 K/uL  Troponin I     Status: None   Collection Time: 01/10/16  3:13 PM  Result Value Ref Range   Troponin I 0.03 <0.031 ng/mL    Comment:        NO INDICATION OF MYOCARDIAL INJURY.   Brain natriuretic peptide     Status: Abnormal   Collection Time: 01/10/16  3:13 PM  Result Value Ref Range   B Natriuretic Peptide 247.0 (H) 0.0 - 100.0 pg/mL  Fibrin derivatives D-Dimer     Status: Abnormal   Collection Time: 01/10/16  3:13 PM  Result Value Ref Range   Fibrin derivatives D-dimer ([facility]) 3032 (H) 0 - 499    Comment: <> Exclusion of Venous Thromboembolism (VTE) - OUTPATIENTS ONLY        (Emergency Department or [place])             0-499 ng/ml (FEU)  : With a low to intermediate pretest                                        probability for VTE this test result                                        excludes the diagnosis of VTE.           > 499 ng/ml (FEU)  : VTE not excluded.  Additional work up                                   for VTE is required.   <>  Testing on Inpatients and Evaluation of Disseminated Intravascular        Coagulation (DIC)             Reference Range:   0-499 ng/ml (FEU)   Hepatic function panel     Status: Abnormal   Collection Time: 01/10/16  3:13 PM  Result Value Ref Range   Total Protein 7.5 6.5 - 8.1 g/dL   Albumin 3.9 3.5 - 5.0 g/dL   AST 29 15 - 41 U/L   ALT 24 14 - 54 U/L   Alkaline Phosphatase 105 38 - 126 U/L   Total Bilirubin 1.4 (H) 0.3 - 1.2 mg/dL   Bilirubin, Direct 0.3 0.1 - 0.5 mg/dL   Indirect Bilirubin 1.1 (H) 0.3  - 0.9 mg/dL  Lipase, blood     Status: None   Collection Time: 01/10/16  3:13 PM  Result Value Ref Range   Lipase 22 11 - 51 U/L  Lactic acid, plasma     Status: Abnormal   Collection Time: 01/10/16  4:07 PM  Result Value Ref Range   Lactic Acid, Venous 4.9 (HH) 0.5 - 2.0 mmol/L    Comment: CRITICAL RESULT CALLED TO, READ BACK BY AND VERIFIED WITH Leafy Half RN AT 6503 01/10/16 MSS.   Troponin I     Status: None   Collection Time: 01/10/16  5:32 PM  Result Value Ref Range   Troponin I 0.03 <0.031 ng/mL    Comment:  NO INDICATION OF MYOCARDIAL INJURY.   Lactic acid, plasma     Status: Abnormal   Collection Time: 01/10/16  7:29 PM  Result Value Ref Range   Lactic Acid, Venous 4.1 (HH) 0.5 - 2.0 mmol/L    Comment: CRITICAL RESULT CALLED TO, READ BACK BY AND VERIFIED WITH CHRISTINE KIM RN AT 2035 01/10/16 MSS.   Urine Drug Screen, Qualitative     Status: Abnormal   Collection Time: 01/10/16  7:58 PM  Result Value Ref Range   Tricyclic, Ur Screen NONE DETECTED NONE DETECTED   Amphetamines, Ur Screen NONE DETECTED NONE DETECTED   MDMA (Ecstasy)Ur Screen NONE DETECTED NONE DETECTED   Cocaine Metabolite,Ur [place] NONE DETECTED NONE DETECTED   Opiate, Ur Screen POSITIVE (A) NONE DETECTED   Phencyclidine (PCP) Ur S NONE DETECTED NONE DETECTED   Cannabinoid 50 Ng, Ur [place] NONE DETECTED NONE DETECTED   Barbiturates, Ur Screen NONE DETECTED NONE DETECTED   Benzodiazepine, Ur Scrn NONE DETECTED NONE DETECTED   Methadone Scn, Ur NONE DETECTED NONE DETECTED    Comment: (NOTE) 621  Tricyclics, urine               Cutoff 1000 ng/mL 200  Amphetamines, urine             Cutoff 1000 ng/mL 300  MDMA (Ecstasy), urine           Cutoff 500 ng/mL 400  Cocaine Metabolite, urine       Cutoff 300 ng/mL 500  Opiate, urine                   Cutoff 300 ng/mL 600  Phencyclidine (PCP), urine      Cutoff 25 ng/mL 700  Cannabinoid, urine              Cutoff 50 ng/mL 800  Barbiturates, urine              Cutoff 200 ng/mL 900  Benzodiazepine, urine           Cutoff 200 ng/mL 1000 Methadone, urine                Cutoff 300 ng/mL 1100 1200 The urine drug screen provides only a preliminary, unconfirmed 1300 analytical test result and should not be used for non-medical 1400 purposes. Clinical consideration and professional judgment should 1500 be applied to any positive drug screen result due to possible 1600 interfering substances. A more specific alternate chemical method 1700 must be used in order to obtain a confirmed analytical result.  1800 Gas chromato graphy / mass spectrometry (GC/MS) is the preferred 1900 confirmatory method.    Ct Angio Chest Pe W/cm &/or Wo Cm  01/10/2016  CLINICAL DATA:  Tachycardia and chest pain, abdominal pain EXAM: CT ANGIOGRAPHY CHEST CT ABDOMEN AND PELVIS WITH CONTRAST TECHNIQUE: Multidetector CT imaging of the chest was performed using the standard protocol during bolus administration of intravenous contrast. Multiplanar CT image reconstructions and MIPs were obtained to evaluate the vascular anatomy. Multidetector CT imaging of the abdomen and pelvis was performed using the standard protocol during bolus administration of intravenous contrast. CONTRAST:  100 mL Isovue 370. COMPARISON:  12/17/2015 12/27/2015 FINDINGS: CTA CHEST FINDINGS The lungs are well aerated bilaterally. No focal infiltrate or sizable effusion is seen. The previously noted infiltrates have resolved in the interval. The thoracic inlet is within normal limits. The thoracic aorta and its branches are unremarkable. No dissection or aneurysm is identified. The pulmonary artery is well visualized and  demonstrates a normal branching pattern. No filling defects to suggest pulmonary emboli are identified. No significant hilar or mediastinal adenopathy is seen. The osseous structures show no acute abnormality. CT ABDOMEN and PELVIS FINDINGS The liver is shrunken with significant nodularity consistent with  underlying cirrhosis. The gallbladder has been surgically removed. Some very mild perigastric varices are noted extending into the distal esophagus. No significant perisplenic varices are seen. Changes suggestive of prior fundoplication are seen in the proximal stomach. Mild ascites is noted predominately in the right lower quadrant as well as surrounding the liver superiorly. The spleen is mildly enlarged. The adrenal glands and pancreas are within normal limits. The kidneys are well visualized bilaterally with a small staghorn type calculus in the lower pole of the right kidney. No obstructive changes are seen. Aortoiliac calcifications are noted without aneurysmal dilatation. The bladder is well distended. The appendix is within normal limits. No diverticulitis is seen. The osseous structures show no acute abnormality. Review of the MIP images confirms the above findings. IMPRESSION: No evidence of pulmonary emboli. Significant clearing of previously seen bilateral infiltrates. Changes of cirrhosis with portal hypertension, ascites and varices. Stable right renal calculus. Electronically Signed   By: Inez Catalina M.D.   On: 01/10/2016 19:42   Ct Abdomen Pelvis W Contrast  01/10/2016  CLINICAL DATA:  Tachycardia and chest pain, abdominal pain EXAM: CT ANGIOGRAPHY CHEST CT ABDOMEN AND PELVIS WITH CONTRAST TECHNIQUE: Multidetector CT imaging of the chest was performed using the standard protocol during bolus administration of intravenous contrast. Multiplanar CT image reconstructions and MIPs were obtained to evaluate the vascular anatomy. Multidetector CT imaging of the abdomen and pelvis was performed using the standard protocol during bolus administration of intravenous contrast. CONTRAST:  100 mL Isovue 370. COMPARISON:  12/17/2015 12/27/2015 FINDINGS: CTA CHEST FINDINGS The lungs are well aerated bilaterally. No focal infiltrate or sizable effusion is seen. The previously noted infiltrates have resolved in  the interval. The thoracic inlet is within normal limits. The thoracic aorta and its branches are unremarkable. No dissection or aneurysm is identified. The pulmonary artery is well visualized and demonstrates a normal branching pattern. No filling defects to suggest pulmonary emboli are identified. No significant hilar or mediastinal adenopathy is seen. The osseous structures show no acute abnormality. CT ABDOMEN and PELVIS FINDINGS The liver is shrunken with significant nodularity consistent with underlying cirrhosis. The gallbladder has been surgically removed. Some very mild perigastric varices are noted extending into the distal esophagus. No significant perisplenic varices are seen. Changes suggestive of prior fundoplication are seen in the proximal stomach. Mild ascites is noted predominately in the right lower quadrant as well as surrounding the liver superiorly. The spleen is mildly enlarged. The adrenal glands and pancreas are within normal limits. The kidneys are well visualized bilaterally with a small staghorn type calculus in the lower pole of the right kidney. No obstructive changes are seen. Aortoiliac calcifications are noted without aneurysmal dilatation. The bladder is well distended. The appendix is within normal limits. No diverticulitis is seen. The osseous structures show no acute abnormality. Review of the MIP images confirms the above findings. IMPRESSION: No evidence of pulmonary emboli. Significant clearing of previously seen bilateral infiltrates. Changes of cirrhosis with portal hypertension, ascites and varices. Stable right renal calculus. Electronically Signed   By: Inez Catalina M.D.   On: 01/10/2016 19:42   Dg Chest Portable 1 View  01/10/2016  CLINICAL DATA:  60 year old female with tachycardia and chest pain for 3 days with  shortness of breath and weakness. Initial encounter. EXAM: PORTABLE CHEST 1 VIEW COMPARISON:  12/29/2015 and earlier. FINDINGS: Portable AP upright view at  1532 hours. Low but improved lung volumes. Stable cardiac size and mediastinal contours. Allowing for portable technique, the lungs are clear. No pneumothorax or pleural effusion. Calcified aortic atherosclerosis. IMPRESSION: No acute cardiopulmonary abnormality. Electronically Signed   By: Genevie Ann M.D.   On: 01/10/2016 15:46    Review of Systems  Constitutional: Negative for fever and chills.  HENT: Negative for sore throat and tinnitus.   Eyes: Negative for blurred vision and redness.  Respiratory: Positive for shortness of breath (chronic). Negative for cough.   Cardiovascular: Positive for chest pain. Negative for palpitations, orthopnea and PND.  Gastrointestinal: Positive for nausea and vomiting. Negative for abdominal pain and diarrhea.  Genitourinary: Negative for dysuria, urgency and frequency.  Musculoskeletal: Negative for myalgias and joint pain.  Skin: Negative for rash.       No lesions  Neurological: Negative for speech change, focal weakness and weakness.  Endo/Heme/Allergies: Does not bruise/bleed easily.       No temperature intolerance  Psychiatric/Behavioral: Negative for depression and suicidal ideas.    Blood pressure 183/90, pulse 111, temperature 98.5 F (36.9 C), temperature source Oral, resp. rate 26, height 5' 3"  (1.6 m), weight 59.421 kg (131 lb), SpO2 96 %. Physical Exam  Vitals reviewed. Constitutional: She is oriented to person, place, and time. She appears well-developed and well-nourished. No distress.  HENT:  Head: Normocephalic and atraumatic.  Mouth/Throat: Oropharynx is clear and moist.  Eyes: Conjunctivae and EOM are normal. Pupils are equal, round, and reactive to light. No scleral icterus.  Neck: Normal range of motion. Neck supple. No JVD present. No tracheal deviation present. No thyromegaly present.  Cardiovascular: Normal rate, regular rhythm and normal heart sounds.  Exam reveals no gallop and no friction rub.   No murmur  heard. Respiratory: Effort normal and breath sounds normal.  GI: Soft. Bowel sounds are normal. She exhibits no distension. There is no tenderness.  Genitourinary:  Deferred  Musculoskeletal: Normal range of motion. She exhibits no edema.  Lymphadenopathy:    She has no cervical adenopathy.  Neurological: She is alert and oriented to person, place, and time. No cranial nerve deficit. She exhibits normal muscle tone.  Skin: Skin is warm and dry. No rash noted. No erythema.  Psychiatric: She has a normal mood and affect. Her behavior is normal. Judgment and thought content normal.     Assessment/Plan This is a 60 year old female with multiple medical problems including tachy-bradycardia syndrome as well as chronic nausea and vomiting who is admitted for intractable nausea and vomiting. 1. Intractable nausea and vomiting: I have added some antiemetics the patient's regimen. Continue Reglan, Bentyl and PPI. Hydrate with intravenous fluid. Likely due to gastroparesis. 2. Tachycardia: Multifactorial including persistent nausea and vomiting as well as underlying tachycardia arrhythmia syndrome. Continue to monitor telemetry. 3. Chest pain: No indication of ACS at this time. Troponins are negative and EKG is sinus tachycardia. Continue to follow cardiac enzymes and monitor telemetry. 4. Essential hypertension: Continue amlodipine and amlodipine. Uncontrolled at this time as the patient is likely vomited her antihypertensives. 5. Atrial fibrillation: paroxysmal; continue Eliquis 6. Prolonged QT interval: This is a chronic issue. The patient is on multiple QT prolonging agents as an outpatient. 7. Diabetes mellitus: Unclear if type I or 2 but we will continue the patient's basal insulin and place on sliding scale. 8. COPD: Continue inhaled  corticosteroid as well as albuterol as needed. 9. Hypothyroidism: Continue Synthroid 10. Hyperlipidemia: Continue statin therapy 11. Cirrhosis of liver: Continue  rifaximin 12. Depression: Celexa 13. DVT prophylaxis: Full anticoagulation as above 14. GI prophylaxis: PPI per home regimen The patient is a DO NOT RESUSCITATE. Time spent on admission orders and patient care proximally 45 minutes  Harrie Foreman, MD 01/10/2016, 8:57 PM

## 2016-01-11 DIAGNOSIS — G8929 Other chronic pain: Secondary | ICD-10-CM | POA: Diagnosis present

## 2016-01-11 DIAGNOSIS — R1084 Generalized abdominal pain: Secondary | ICD-10-CM | POA: Diagnosis present

## 2016-01-11 DIAGNOSIS — E108 Type 1 diabetes mellitus with unspecified complications: Secondary | ICD-10-CM | POA: Diagnosis present

## 2016-01-11 DIAGNOSIS — K7460 Unspecified cirrhosis of liver: Secondary | ICD-10-CM | POA: Diagnosis present

## 2016-01-11 DIAGNOSIS — I10 Essential (primary) hypertension: Secondary | ICD-10-CM | POA: Diagnosis present

## 2016-01-11 DIAGNOSIS — F419 Anxiety disorder, unspecified: Secondary | ICD-10-CM | POA: Diagnosis present

## 2016-01-11 DIAGNOSIS — E119 Type 2 diabetes mellitus without complications: Secondary | ICD-10-CM | POA: Diagnosis not present

## 2016-01-11 DIAGNOSIS — I495 Sick sinus syndrome: Secondary | ICD-10-CM | POA: Diagnosis present

## 2016-01-11 DIAGNOSIS — Z885 Allergy status to narcotic agent status: Secondary | ICD-10-CM | POA: Diagnosis not present

## 2016-01-11 DIAGNOSIS — I214 Non-ST elevation (NSTEMI) myocardial infarction: Secondary | ICD-10-CM | POA: Clinically undetermined

## 2016-01-11 DIAGNOSIS — I69354 Hemiplegia and hemiparesis following cerebral infarction affecting left non-dominant side: Secondary | ICD-10-CM | POA: Diagnosis not present

## 2016-01-11 DIAGNOSIS — I248 Other forms of acute ischemic heart disease: Secondary | ICD-10-CM | POA: Diagnosis present

## 2016-01-11 DIAGNOSIS — R0902 Hypoxemia: Secondary | ICD-10-CM | POA: Diagnosis not present

## 2016-01-11 DIAGNOSIS — Z881 Allergy status to other antibiotic agents status: Secondary | ICD-10-CM | POA: Diagnosis not present

## 2016-01-11 DIAGNOSIS — I480 Paroxysmal atrial fibrillation: Secondary | ICD-10-CM

## 2016-01-11 DIAGNOSIS — Z66 Do not resuscitate: Secondary | ICD-10-CM | POA: Diagnosis present

## 2016-01-11 DIAGNOSIS — Z7951 Long term (current) use of inhaled steroids: Secondary | ICD-10-CM | POA: Diagnosis not present

## 2016-01-11 DIAGNOSIS — Z79891 Long term (current) use of opiate analgesic: Secondary | ICD-10-CM | POA: Diagnosis not present

## 2016-01-11 DIAGNOSIS — Z794 Long term (current) use of insulin: Secondary | ICD-10-CM

## 2016-01-11 DIAGNOSIS — Z79899 Other long term (current) drug therapy: Secondary | ICD-10-CM | POA: Diagnosis not present

## 2016-01-11 DIAGNOSIS — Z8711 Personal history of peptic ulcer disease: Secondary | ICD-10-CM | POA: Diagnosis not present

## 2016-01-11 DIAGNOSIS — E785 Hyperlipidemia, unspecified: Secondary | ICD-10-CM | POA: Diagnosis present

## 2016-01-11 DIAGNOSIS — K219 Gastro-esophageal reflux disease without esophagitis: Secondary | ICD-10-CM | POA: Diagnosis present

## 2016-01-11 DIAGNOSIS — M6289 Other specified disorders of muscle: Secondary | ICD-10-CM | POA: Diagnosis not present

## 2016-01-11 DIAGNOSIS — K3184 Gastroparesis: Secondary | ICD-10-CM | POA: Diagnosis present

## 2016-01-11 DIAGNOSIS — Z7901 Long term (current) use of anticoagulants: Secondary | ICD-10-CM | POA: Diagnosis not present

## 2016-01-11 DIAGNOSIS — Z8249 Family history of ischemic heart disease and other diseases of the circulatory system: Secondary | ICD-10-CM | POA: Diagnosis not present

## 2016-01-11 DIAGNOSIS — Z887 Allergy status to serum and vaccine status: Secondary | ICD-10-CM | POA: Diagnosis not present

## 2016-01-11 DIAGNOSIS — G43A1 Cyclical vomiting, intractable: Secondary | ICD-10-CM

## 2016-01-11 DIAGNOSIS — Z888 Allergy status to other drugs, medicaments and biological substances status: Secondary | ICD-10-CM | POA: Diagnosis not present

## 2016-01-11 DIAGNOSIS — J449 Chronic obstructive pulmonary disease, unspecified: Secondary | ICD-10-CM | POA: Diagnosis present

## 2016-01-11 DIAGNOSIS — E1043 Type 1 diabetes mellitus with diabetic autonomic (poly)neuropathy: Secondary | ICD-10-CM | POA: Diagnosis present

## 2016-01-11 DIAGNOSIS — K449 Diaphragmatic hernia without obstruction or gangrene: Secondary | ICD-10-CM | POA: Diagnosis present

## 2016-01-11 DIAGNOSIS — I471 Supraventricular tachycardia: Secondary | ICD-10-CM | POA: Diagnosis not present

## 2016-01-11 DIAGNOSIS — Z8542 Personal history of malignant neoplasm of other parts of uterus: Secondary | ICD-10-CM | POA: Diagnosis not present

## 2016-01-11 DIAGNOSIS — F329 Major depressive disorder, single episode, unspecified: Secondary | ICD-10-CM | POA: Diagnosis present

## 2016-01-11 DIAGNOSIS — E039 Hypothyroidism, unspecified: Secondary | ICD-10-CM | POA: Diagnosis present

## 2016-01-11 DIAGNOSIS — I4581 Long QT syndrome: Secondary | ICD-10-CM | POA: Diagnosis not present

## 2016-01-11 LAB — TSH: TSH: 14.097 u[IU]/mL — ABNORMAL HIGH (ref 0.350–4.500)

## 2016-01-11 LAB — PROTIME-INR
INR: 1.71
PROTHROMBIN TIME: 20.1 s — AB (ref 11.4–15.0)

## 2016-01-11 LAB — APTT: APTT: 36 s (ref 24–36)

## 2016-01-11 LAB — GLUCOSE, CAPILLARY
Glucose-Capillary: 257 mg/dL — ABNORMAL HIGH (ref 65–99)
Glucose-Capillary: 197 mg/dL — ABNORMAL HIGH (ref 65–99)
Glucose-Capillary: 142 mg/dL — ABNORMAL HIGH (ref 65–99)
Glucose-Capillary: 199 mg/dL — ABNORMAL HIGH (ref 65–99)

## 2016-01-11 LAB — TROPONIN I
Troponin I: 0.04 ng/mL — ABNORMAL HIGH (ref <0.031)
Troponin I: 0.28 ng/mL — ABNORMAL HIGH (ref <0.031)
Troponin I: 0.49 ng/mL — ABNORMAL HIGH (ref <0.031)
Troponin I: 0.42 ng/mL — ABNORMAL HIGH (ref <0.031)
Troponin I: 0.26 ng/mL — ABNORMAL HIGH (ref <0.031)

## 2016-01-11 LAB — HEMOGLOBIN A1C: Hgb A1c MFr Bld: 7.7 % — ABNORMAL HIGH (ref 4.0–6.0)

## 2016-01-11 MED ORDER — ENOXAPARIN SODIUM 60 MG/0.6ML ~~LOC~~ SOLN
1.0000 mg/kg | Freq: Two times a day (BID) | SUBCUTANEOUS | Status: DC
  Administered 2016-01-11: 60 mg via SUBCUTANEOUS
  Filled 2016-01-11: qty 0.6

## 2016-01-11 MED ORDER — NITROGLYCERIN 2 % TD OINT
1.0000 [in_us] | TOPICAL_OINTMENT | Freq: Four times a day (QID) | TRANSDERMAL | Status: DC
  Administered 2016-01-11 – 2016-01-13 (×5): 1 [in_us] via TOPICAL
  Filled 2016-01-11: qty 30

## 2016-01-11 MED ORDER — METOPROLOL TARTRATE 50 MG PO TABS
75.0000 mg | ORAL_TABLET | Freq: Two times a day (BID) | ORAL | Status: DC
  Administered 2016-01-11 – 2016-01-13 (×3): 75 mg via ORAL
  Filled 2016-01-11 (×4): qty 1

## 2016-01-11 MED ORDER — INSULIN ASPART 100 UNIT/ML ~~LOC~~ SOLN
0.0000 [IU] | Freq: Three times a day (TID) | SUBCUTANEOUS | Status: DC
  Administered 2016-01-11: 1 [IU] via SUBCUTANEOUS
  Administered 2016-01-11 – 2016-01-13 (×2): 2 [IU] via SUBCUTANEOUS
  Administered 2016-01-13: 3 [IU] via SUBCUTANEOUS
  Filled 2016-01-11: qty 1
  Filled 2016-01-11 (×2): qty 2
  Filled 2016-01-11: qty 3

## 2016-01-11 MED ORDER — HEPARIN (PORCINE) IN NACL 100-0.45 UNIT/ML-% IJ SOLN
700.0000 [IU]/h | INTRAMUSCULAR | Status: DC
  Administered 2016-01-11: 700 [IU]/h via INTRAVENOUS
  Filled 2016-01-11: qty 250

## 2016-01-11 MED ORDER — INSULIN DETEMIR 100 UNIT/ML ~~LOC~~ SOLN
17.0000 [IU] | Freq: Every day | SUBCUTANEOUS | Status: DC
Start: 2016-01-11 — End: 2016-01-13
  Administered 2016-01-11 – 2016-01-12 (×2): 17 [IU] via SUBCUTANEOUS
  Filled 2016-01-11 (×4): qty 0.17

## 2016-01-11 MED ORDER — INSULIN ASPART 100 UNIT/ML ~~LOC~~ SOLN: 0.0000 [IU] | Freq: Every day | SUBCUTANEOUS | Status: DC

## 2016-01-11 MED ORDER — LEVOTHYROXINE SODIUM 100 MCG PO TABS
100.0000 ug | ORAL_TABLET | Freq: Every day | ORAL | Status: DC
  Administered 2016-01-12 – 2016-01-13 (×2): 100 ug via ORAL
  Filled 2016-01-11 (×2): qty 1

## 2016-01-11 MED ORDER — ASPIRIN EC 81 MG PO TBEC
81.0000 mg | DELAYED_RELEASE_TABLET | Freq: Once | ORAL | Status: AC
  Administered 2016-01-11: 81 mg via ORAL
  Filled 2016-01-11: qty 1

## 2016-01-11 NOTE — Progress Notes (Addendum)
[facility] NAME: [name]    MR#:  [id number]  DATE OF BIRTH:  1955/11/08  SUBJECTIVE:   Presented from Dr Tyrone Sage office With poor by mouth intake, tachycardia and nausea. CT scan did not show evidence of diverticulitis. Heart rates better control. Patient with nausea this morning however did eat banana for breakfast  REVIEW OF SYSTEMS:    Review of Systems  Constitutional: Negative for fever, chills and malaise/fatigue.  HENT: Negative for ear discharge, ear pain, hearing loss, nosebleeds and sore throat.   Eyes: Negative for blurred vision and pain.  Respiratory: Negative for cough, hemoptysis, shortness of breath and wheezing.   Cardiovascular: Negative for chest pain, palpitations and leg swelling.  Gastrointestinal: Positive for nausea, abdominal pain and diarrhea. Negative for vomiting and blood in stool.  Genitourinary: Negative for dysuria.  Musculoskeletal: Negative for back pain.  Neurological: Negative for dizziness, tremors, speech change, focal weakness, seizures and headaches.  Endo/Heme/Allergies: Does not bruise/bleed easily.  Psychiatric/Behavioral: Negative for depression, suicidal ideas and hallucinations.    Tolerating Diet:Somewhat      DRUG ALLERGIES:   Allergies  Allergen Reactions  . Hydrocodone Other (See Comments)    Pt states that this medication caused cirrhosis of the liver.    . Erythromycin Other (See Comments)    Reaction:  Fever   . Prednisone Other (See Comments)    Reaction:  Unknown   . Rosiglitazone Maleate Swelling  . Codeine Sulfate Rash  . Tetanus-Diphtheria Toxoids Td Rash and Other (See Comments)    Reaction:  Fever     VITALS:  Blood pressure 129/66, pulse 109, temperature 98 F (36.7 C), temperature source Oral, resp. rate 20, height 5' 3"  (1.6 m), weight 59.285 kg (130 lb 11.2 oz), SpO2 96 %.  PHYSICAL EXAMINATION:   Physical Exam  Constitutional: She is  oriented to person, place, and time and well-developed, well-nourished, and in no distress. No distress.  HENT:  Head: Normocephalic.  Eyes: No scleral icterus.  Neck: Normal range of motion. Neck supple. No JVD present. No tracheal deviation present.  Cardiovascular: Normal rate, regular rhythm and normal heart sounds.  Exam reveals no gallop and no friction rub.   No murmur heard. Pulmonary/Chest: Effort normal and breath sounds normal. No respiratory distress. She has no wheezes. She has no rales. She exhibits no tenderness.  Abdominal: Soft. Bowel sounds are normal. She exhibits no distension and no mass. There is no tenderness. There is no rebound and no guarding.  Musculoskeletal: Normal range of motion. She exhibits no edema.  Neurological: She is alert and oriented to person, place, and time.  Skin: Skin is warm. No rash noted. No erythema.  Psychiatric: Affect and judgment normal.      LABORATORY PANEL:   CBC  Recent Labs Lab 01/10/16 1513  WBC 6.9  HGB 11.3*  HCT 35.2  PLT 142*   ------------------------------------------------------------------------------------------------------------------  Chemistries   Recent Labs Lab 01/10/16 1513  NA 138  K 3.5  CL 106  CO2 21*  GLUCOSE 203*  BUN 13  CREATININE 0.86  CALCIUM 9.1  AST 29  ALT 24  ALKPHOS 105  BILITOT 1.4*   ------------------------------------------------------------------------------------------------------------------  Cardiac Enzymes  Recent Labs Lab 01/10/16 2208 01/11/16 0403 01/11/16 0946  TROPONINI 0.04* 0.28* 0.49*   ------------------------------------------------------------------------------------------------------------------  RADIOLOGY:  Ct Angio Chest Pe W/cm &/or Wo Cm  01/10/2016  CLINICAL DATA:  Tachycardia and chest pain, abdominal pain EXAM: CT ANGIOGRAPHY CHEST  CT ABDOMEN AND PELVIS WITH CONTRAST TECHNIQUE: Multidetector CT imaging of the chest was performed using the  standard protocol during bolus administration of intravenous contrast. Multiplanar CT image reconstructions and MIPs were obtained to evaluate the vascular anatomy. Multidetector CT imaging of the abdomen and pelvis was performed using the standard protocol during bolus administration of intravenous contrast. CONTRAST:  100 mL Isovue 370. COMPARISON:  12/17/2015 12/27/2015 FINDINGS: CTA CHEST FINDINGS The lungs are well aerated bilaterally. No focal infiltrate or sizable effusion is seen. The previously noted infiltrates have resolved in the interval. The thoracic inlet is within normal limits. The thoracic aorta and its branches are unremarkable. No dissection or aneurysm is identified. The pulmonary artery is well visualized and demonstrates a normal branching pattern. No filling defects to suggest pulmonary emboli are identified. No significant hilar or mediastinal adenopathy is seen. The osseous structures show no acute abnormality. CT ABDOMEN and PELVIS FINDINGS The liver is shrunken with significant nodularity consistent with underlying cirrhosis. The gallbladder has been surgically removed. Some very mild perigastric varices are noted extending into the distal esophagus. No significant perisplenic varices are seen. Changes suggestive of prior fundoplication are seen in the proximal stomach. Mild ascites is noted predominately in the right lower quadrant as well as surrounding the liver superiorly. The spleen is mildly enlarged. The adrenal glands and pancreas are within normal limits. The kidneys are well visualized bilaterally with a small staghorn type calculus in the lower pole of the right kidney. No obstructive changes are seen. Aortoiliac calcifications are noted without aneurysmal dilatation. The bladder is well distended. The appendix is within normal limits. No diverticulitis is seen. The osseous structures show no acute abnormality. Review of the MIP images confirms the above findings. IMPRESSION:  No evidence of pulmonary emboli. Significant clearing of previously seen bilateral infiltrates. Changes of cirrhosis with portal hypertension, ascites and varices. Stable right renal calculus. Electronically Signed   By: Inez Catalina M.D.   On: 01/10/2016 19:42   Ct Abdomen Pelvis W Contrast  01/10/2016  CLINICAL DATA:  Tachycardia and chest pain, abdominal pain EXAM: CT ANGIOGRAPHY CHEST CT ABDOMEN AND PELVIS WITH CONTRAST TECHNIQUE: Multidetector CT imaging of the chest was performed using the standard protocol during bolus administration of intravenous contrast. Multiplanar CT image reconstructions and MIPs were obtained to evaluate the vascular anatomy. Multidetector CT imaging of the abdomen and pelvis was performed using the standard protocol during bolus administration of intravenous contrast. CONTRAST:  100 mL Isovue 370. COMPARISON:  12/17/2015 12/27/2015 FINDINGS: CTA CHEST FINDINGS The lungs are well aerated bilaterally. No focal infiltrate or sizable effusion is seen. The previously noted infiltrates have resolved in the interval. The thoracic inlet is within normal limits. The thoracic aorta and its branches are unremarkable. No dissection or aneurysm is identified. The pulmonary artery is well visualized and demonstrates a normal branching pattern. No filling defects to suggest pulmonary emboli are identified. No significant hilar or mediastinal adenopathy is seen. The osseous structures show no acute abnormality. CT ABDOMEN and PELVIS FINDINGS The liver is shrunken with significant nodularity consistent with underlying cirrhosis. The gallbladder has been surgically removed. Some very mild perigastric varices are noted extending into the distal esophagus. No significant perisplenic varices are seen. Changes suggestive of prior fundoplication are seen in the proximal stomach. Mild ascites is noted predominately in the right lower quadrant as well as surrounding the liver superiorly. The spleen is  mildly enlarged. The adrenal glands and pancreas are within normal limits. The kidneys are  well visualized bilaterally with a small staghorn type calculus in the lower pole of the right kidney. No obstructive changes are seen. Aortoiliac calcifications are noted without aneurysmal dilatation. The bladder is well distended. The appendix is within normal limits. No diverticulitis is seen. The osseous structures show no acute abnormality. Review of the MIP images confirms the above findings. IMPRESSION: No evidence of pulmonary emboli. Significant clearing of previously seen bilateral infiltrates. Changes of cirrhosis with portal hypertension, ascites and varices. Stable right renal calculus. Electronically Signed   By: Inez Catalina M.D.   On: 01/10/2016 19:42   Dg Chest Portable 1 View  01/10/2016  CLINICAL DATA:  60 year old female with tachycardia and chest pain for 3 days with shortness of breath and weakness. Initial encounter. EXAM: PORTABLE CHEST 1 VIEW COMPARISON:  12/29/2015 and earlier. FINDINGS: Portable AP upright view at 1532 hours. Low but improved lung volumes. Stable cardiac size and mediastinal contours. Allowing for portable technique, the lungs are clear. No pneumothorax or pleural effusion. Calcified aortic atherosclerosis. IMPRESSION: No acute cardiopulmonary abnormality. Electronically Signed   By: Genevie Ann M.D.   On: 01/10/2016 15:46     ASSESSMENT AND PLAN:   39 female with a history of hypothyroidism and multiple episodes of diverticulitis and colitis, PAF who presented from pulmonary office with chest pain, tachycardia and nausea.   1. Intractable nausea and vomiting: Patient symptoms seem to be improving. This is likely due to gastroparesis. Continue supportive care. CT of the abdomen shows no acute pathology. 2. Atrial fibrillation with RVR: Heart rate seemed to be better controlled.  3. Elevated troponin with chest pain on admission: Troponin slowly increasing which may be  due to demand ischemia from tachycardia. Follow-up on cardiology consult Continue heparin drip aspirin, atorvastatin and metoprolol. Continue to trend troponins.  4. Hypothyroid: TSH elevated. Increased dose of Synthroid to 100 mg daily. Patient will need repeat TFTs in 4 weeks.  5. Essential hypertension: Blood pressure is controlled. Continue Norvasc and metoprolol.  6. Diabetes: Continue sliding scale insulin, Levemir 17 units. Appreciate diabetes coordinator consult.  Management plans discussed with the patient and she is in agreement.  CODE STATUS: FULL  TOTAL TIME TAKING CARE OF THIS PATIENT: 30 minutes.     POSSIBLE D/C 1-2 days, DEPENDING ON CLINICAL CONDITION.   Martrice Apt M.D on 01/11/2016 at 11:54 AM  Between 7am to 6pm - Pager - [phone] After 6pm go to [email or web address] - password [email or web address] [facility]  Office  [phone]  CC: Primary care physician; [place] Pink, MD  Note: This dictation was prepared with Dragon dictation along with smaller phrase technology. Any transcriptional errors that result from this process are unintentional.

## 2016-01-11 NOTE — Care Management (Addendum)
Cm assessment for discharge needs. Met with patient at bedside. She is alert, oriented and lives at home with her husband and 2 sons. She uses a walker and is assisted by her husband with adl's. PCP is Dr. Hedrick. Patient active with [facility] for Sn and PT. [facility] notified of admission. Following progression.  

## 2016-01-11 NOTE — Plan of Care (Signed)
Problem: Consults Goal: Cardiac Cath Patient Education (See Patient Education module for education specifics.)  Outcome: Progressing Patient has been provided with exit care pertaining to coronary angiogram and is currently watching the coronary angiogram and angioplasty video.   Problem: Phase I Progression Outcomes Goal: Pain controlled with appropriate interventions Outcome: Progressing No c/o chest pain/pressure/tightness since admission.

## 2016-01-11 NOTE — Progress Notes (Signed)
Pt troponin elevated this am at 0.28. MD Dr. Estanislado Pandy notified. Orders placed for cardiology consult, aspirin once, and lovenox q 12h. Rachael Fee, RN

## 2016-01-11 NOTE — Progress Notes (Signed)
ANTICOAGULATION CONSULT NOTE - Initial Consult  Pharmacy Consult for Heparin Drip Indication: chest pain/ACS  Allergies  Allergen Reactions  . Hydrocodone Other (See Comments)    Pt states that this medication caused cirrhosis of the liver.    . Erythromycin Other (See Comments)    Reaction:  Fever   . Prednisone Other (See Comments)    Reaction:  Unknown   . Rosiglitazone Maleate Swelling  . Codeine Sulfate Rash  . Tetanus-Diphtheria Toxoids Td Rash and Other (See Comments)    Reaction:  Fever     Patient Measurements: Height: 5' 3"  (160 cm) Weight: 130 lb 11.2 oz (59.285 kg) IBW/kg (Calculated) : 52.4 Heparin Dosing Weight: 59.3 kg  Vital Signs: Temp: 98 F (36.7 C) (05/03 0532) Temp Source: Oral (05/03 0532) BP: 129/66 mmHg (05/03 0951) Pulse Rate: 109 (05/03 0951)  Labs:  Recent Labs  01/10/16 1513  01/10/16 2208 01/11/16 0403 01/11/16 0946  HGB 11.3*  --   --   --   --   HCT 35.2  --   --   --   --   PLT 142*  --   --   --   --   CREATININE 0.86  --   --   --   --   TROPONINI 0.03  < > 0.04* 0.28* 0.49*  < > = values in this interval not displayed.  Estimated Creatinine Clearance: 56.1 mL/min (by C-G formula based on Cr of 0.86).   Medical History: Past Medical History  Diagnosis Date  . Type 1 diabetes ([place])   . Hypothyroid   . Degenerative disk disease   . Stomach ulcer   . Diverticulitis   . Syncope 01/2015  . Anxiety   . GERD (gastroesophageal reflux disease)   . History of hiatal hernia   . Cancer ([facility])     HX OF CANCER OF UTERUS   . TIA (transient ischemic attack) 02/2015  . Hypertension   . Pancreatitis   . PAF (paroxysmal atrial fibrillation) ([place]) 03/2015    a. new onset 03/2015 in setting of intractable N/V; b. on Eliquis 5 mg bid; c. CHADSVASc 4 (DM, TIA x 2, female)  . Intussusception intestine ([place]) 05/2015  . Stroke ([place])   . Allergy   . Cirrhosis of liver not due to alcohol ([place]) 2016    Medications:  Scheduled:  .  amLODipine  5 mg Oral Daily  . atorvastatin  40 mg Oral QHS  . citalopram  20 mg Oral Daily  . dicyclomine  10 mg Oral TID AC & HS  . docusate sodium  100 mg Oral BID  . insulin aspart  0-9 Units Subcutaneous TID WC  . insulin detemir  12 Units Subcutaneous QHS  . levothyroxine  75 mcg Oral QAC breakfast  . magnesium oxide  400 mg Oral BID  . metoCLOPramide  5 mg Oral TID AC & HS  . metoprolol tartrate  75 mg Oral BID  . mometasone-formoterol  2 puff Inhalation BID  . morphine  15 mg Oral Q12H  . nitroGLYCERIN  1 inch Topical Q6H  . pantoprazole  40 mg Oral BID AC  . rifaximin  550 mg Oral BID  . sodium chloride flush  3 mL Intravenous Q12H   Infusions:  . sodium chloride 100 mL/hr at 01/11/16 0732  . heparin      Assessment: Pharmacy consulted to start heparin drip and titrate in a 60 yo female for STEMI/ACS. Patient with afib and takes  Eliquis 5 mg po BID.  Patient received a dose of Eliquis at 2214 on 5/2.  Lovenox 60 mg subq q12h was then ordered and patient received a dose of Lovenox 60 mg subq at 0613 today.    Baseline labs ordered: INR, aPTT   Goal of Therapy:  Heparin level 0.3-0.7 units/ml Monitor platelets by anticoagulation protocol: Yes   Plan:  As patient received Lovenox 60 mg subq once today at 0613, will initiate heparin drip about 10-11 hours after dose at 1630 today.  Rate calculated at 12 units/kg/hr for ACS to be 700 units/hr.  Will order HL 6 hours after start of drip at 2230 today.   Pharmacy will continue to follow.   Shyteria Lewis G 01/11/2016,11:50 AM

## 2016-01-11 NOTE — Progress Notes (Signed)
Pt BP still high, 186/86. MD Dr. Marcille Blanco notified. Given prn orders for hydralazine. RN will administer and continue to monitor. Rachael Fee, RN

## 2016-01-11 NOTE — Consult Note (Signed)
Cardiology Consultation Note  Patient ID: [name], MRN: [id number], DOB/AGE: [birth year] 62 y.o. Admit date: 01/10/2016   Date of Consult: 01/11/2016 Primary Physician: Maryland Pink, MD Primary Cardiologist: [facility], though has never followed up as an outpatient   Chief Complaint: Chest pain, continued intractable nausea, vomiting, and diarrhea  Reason for Consult: NSTEMI  HPI: 60 y.o. female with h/o PAF on Eliquis, tachy-brady syndrome, DM1, hypothyroidism, syncope, anxiety, GERD, TIA/CVA with residual left-sided weakness, HTN, and uterine cancer who was recently admitted to [facility] in late April for N/V/D returned to [facility] after being sent to the ED by pulmonology 2/2 chest pain.  She was recently admitted to [facility] in January 2017 with dizziness, presyncope, and bradycardia. At that time she was taking metoprolol 25 mg bid and amiodarone 200 mg daily. She had previously been advised by EP to discontinue amiodarone 2/2 bradycardia which was felt to be leading to her falls and hypotension, though apparently this was restarted at sometime between September and January without seeing cardiology as an outpatient. QTc was noted to be prolonged while on amiodarone at 550 ms. During that admission echo showed EF 60-65%, normal wall motion, GR1DD. Carotid dopplers showed bilateral 1-39% stenosis. She was seen by EP given her tachy-brady syndrome and felt to not be a candidate for PPM. Given her bradycardic rates her amiodarone was not resumed and she was discharged on metoprolol. She has had several hospital readmissions since then for severe abdominal pain, nausea, and vomiting of uncertain etiology. SHe has yet to follow up with GI as an outpatient. Admitted to [facility] in mid to late April with return of intractable nausea, vomiting, and diarrhea with associated syncope. Cardiology was asked to evaluate for possible bradycardic rates into the 30's bpm; however, on telemetry there were no rates this bradycardic.  She was hypotensive in the setting of sepsis 2/2 colitis that admission and required dopamine. Her troponin level remained negative. Echo was performed and is detailed below, though EF was noted to be 60-65%. TSH was found to be elevated and managed by IM. She remained in sinus rhythm and was continued on Eliquis and Lopressor.   While at her pulmonolgist's office on 5/2 she complained of chest pain in the setting of worsening nausea, vomiting, and diarrhea. She was noted to be tachycardic there and was sent to the ED. In the ED her initial troponin was negative. Subsequent levels were found to be 0.04-->0.28-->0.49. She complained of 10/10 chest pain. She was given aspirin and her metoprolol. She continued to note 8/10 chest pain, though was laughing and in no apparent distress. Her blood pressure was found to be elevated in the 389H systolic. EKC as below. BNP 247. CXR negative. CTA chest, abdomen, pelvis showed no PE, and significant clearing of the bilateral infiltrates, changes c/w portal HTN, ascites, and varices. She last took her Eliquis on the evening of 5/2.     Past Medical History  Diagnosis Date  . Type 1 diabetes ([place])   . Hypothyroid   . Degenerative disk disease   . Stomach ulcer   . Diverticulitis   . Syncope 01/2015  . Anxiety   . GERD (gastroesophageal reflux disease)   . History of hiatal hernia   . Cancer (HCC)     HX OF CANCER OF UTERUS   . TIA (transient ischemic attack) 02/2015  . Hypertension   . Pancreatitis   . PAF (paroxysmal atrial fibrillation) ([place]) 03/2015    a. new onset 03/2015 in  setting of intractable N/V; b. on Eliquis 5 mg bid; c. CHADSVASc 4 (DM, TIA x 2, female)  . Intussusception intestine ([place]) 05/2015  . Stroke ([place])   . Allergy   . Cirrhosis of liver not due to alcohol [facility]) 2016      Most Recent Cardiac Studies: Echo 12/28/2015: Study Conclusions  - Left ventricle: The cavity size was normal. Systolic function was  normal. The estimated  ejection fraction was in the range of 60%  to 65%. Wall motion was normal; there were no regional wall  motion abnormalities. Doppler parameters are consistent with  abnormal left ventricular relaxation (grade 1 diastolic  dysfunction). - Mitral valve: There was mild regurgitation. - Left atrium: The atrium was mildly dilated. - Right ventricle: Systolic function was normal. - Pulmonary arteries: Systolic pressure was within the normal  range.  Impressions:  - Challenging images though grossly a normal study. No source of  TIA or CVA noted.   Surgical History:  Past Surgical History  Procedure Laterality Date  . Hernia repair    . Abdominal hysterectomy    . Cholecystectomy    . Esophagogastroduodenoscopy N/A 04/04/2015    Procedure: ESOPHAGOGASTRODUODENOSCOPY (EGD);  Surgeon: Hulen Luster, MD;  Location: [facility] ENDOSCOPY;  Service: Endoscopy;  Laterality: N/A;     Home Meds: Prior to Admission medications   Medication Sig Start Date End Date Taking? Authorizing Provider  albuterol (PROVENTIL HFA;VENTOLIN HFA) 108 (90 Base) MCG/ACT inhaler Inhale 2 puffs into the lungs every 6 (six) hours as needed for wheezing or shortness of breath. 12/19/15  Yes Gladstone Lighter, MD  amLODipine (NORVASC) 5 MG tablet Take 5 mg by mouth daily.   Yes Historical Provider, MD  apixaban (ELIQUIS) 5 MG TABS tablet Take 1 tablet (5 mg total) by mouth 2 (two) times daily. 04/07/15  Yes Bettey Costa, MD  atorvastatin (LIPITOR) 40 MG tablet Take 40 mg by mouth at bedtime.   Yes Historical Provider, MD  benzonatate (TESSALON) 200 MG capsule Take 1 capsule (200 mg total) by mouth 3 (three) times daily as needed for cough. 12/19/15  Yes Gladstone Lighter, MD  budesonide-formoterol (SYMBICORT) 160-4.5 MCG/ACT inhaler Inhale 2 puffs into the lungs 2 (two) times daily. 12/19/15  Yes Gladstone Lighter, MD  citalopram (CELEXA) 20 MG tablet Take 20 mg by mouth daily.   Yes Historical Provider, MD  dicyclomine  (BENTYL) 10 MG capsule Take 10 mg by mouth 4 (four) times daily -  before meals and at bedtime.    Yes Historical Provider, MD  insulin aspart (NOVOLOG) 100 UNIT/ML injection Inject into the skin 3 (three) times daily with meals as needed for high blood sugar. Pt uses as needed per sliding scale.   Yes Historical Provider, MD  insulin detemir (LEVEMIR) 100 UNIT/ML injection Inject 17 Units into the skin at bedtime.   Yes Historical Provider, MD  levothyroxine (SYNTHROID, LEVOTHROID) 75 MCG tablet Take 1 tablet (75 mcg total) by mouth daily before breakfast. 10/08/15  Yes Loleta Chance, MD  magnesium oxide (MAG-OX) 400 (241.3 Mg) MG tablet Take 1 tablet (400 mg total) by mouth 2 (two) times daily. 11/16/15  Yes Belkys A Regalado, MD  metoCLOPramide (REGLAN) 5 MG tablet Take 1 tablet (5 mg total) by mouth 4 (four) times daily -  before meals and at bedtime. 12/19/15  Yes Gladstone Lighter, MD  metoprolol tartrate (LOPRESSOR) 25 MG tablet Take 25 mg by mouth 2 (two) times daily.    Yes Historical Provider, MD  morphine (  MS CONTIN) 15 MG 12 hr tablet Take 1 tablet (15 mg total) by mouth every 8 (eight) hours. 10/21/15  Yes Lance Bosch, MD  oxyCODONE (OXY IR/ROXICODONE) 5 MG immediate release tablet Take 5-10 mg by mouth every 6 (six) hours as needed for severe pain.   Yes Historical Provider, MD  pantoprazole (PROTONIX) 40 MG tablet Take 40 mg by mouth 2 (two) times daily before a meal.   Yes Historical Provider, MD  rifaximin (XIFAXAN) 550 MG TABS tablet Take 1 tablet (550 mg total) by mouth 2 (two) times daily. 07/11/15  Yes Theodoro Grist, MD  tiZANidine (ZANAFLEX) 4 MG tablet Take 4 mg by mouth 3 (three) times daily as needed for muscle spasms.    Yes Historical Provider, MD    Inpatient Medications:  . amLODipine  5 mg Oral Daily  . atorvastatin  40 mg Oral QHS  . citalopram  20 mg Oral Daily  . dicyclomine  10 mg Oral TID AC & HS  . docusate sodium  100 mg Oral BID  . insulin aspart  0-5 Units  Subcutaneous QHS  . insulin aspart  0-9 Units Subcutaneous TID WC  . insulin detemir  17 Units Subcutaneous QHS  . [START ON 01/12/2016] levothyroxine  100 mcg Oral QAC breakfast  . magnesium oxide  400 mg Oral BID  . metoCLOPramide  5 mg Oral TID AC & HS  . metoprolol tartrate  75 mg Oral BID  . mometasone-formoterol  2 puff Inhalation BID  . morphine  15 mg Oral Q12H  . nitroGLYCERIN  1 inch Topical Q6H  . pantoprazole  40 mg Oral BID AC  . rifaximin  550 mg Oral BID  . sodium chloride flush  3 mL Intravenous Q12H   . sodium chloride 100 mL/hr at 01/11/16 0732  . heparin      Allergies:  Allergies  Allergen Reactions  . Hydrocodone Other (See Comments)    Pt states that this medication caused cirrhosis of the liver.    . Erythromycin Other (See Comments)    Reaction:  Fever   . Prednisone Other (See Comments)    Reaction:  Unknown   . Rosiglitazone Maleate Swelling  . Codeine Sulfate Rash  . Tetanus-Diphtheria Toxoids Td Rash and Other (See Comments)    Reaction:  Fever     Social History   Social History  . Marital Status: Married    Spouse Name: N/A  . Number of Children: N/A  . Years of Education: N/A   Occupational History  . Disabled 2nd back problems    Social History Main Topics  . Smoking status: Never Smoker   . Smokeless tobacco: Never Used  . Alcohol Use: No  . Drug Use: No  . Sexual Activity: No   Other Topics Concern  . Not on file   Social History Narrative   Lives in [place], [place] with her husband and 2 sons.     Family History  Problem Relation Age of Onset  . Hypertension Mother   . CAD Sister   . Heart attack Sister     Deceased 2014-11-29  . CAD Brother      Review of Systems: Review of Systems  Constitutional: Positive for weight loss and malaise/fatigue. Negative for fever, chills and diaphoresis.  HENT: Negative for congestion.   Eyes: Negative for discharge and redness.  Respiratory: Positive for shortness of breath. Negative  for cough, hemoptysis, sputum production and wheezing.   Cardiovascular: Positive for chest pain  and palpitations. Negative for orthopnea, claudication, leg swelling and PND.  Gastrointestinal: Positive for nausea, vomiting and diarrhea. Negative for heartburn, constipation, blood in stool and melena.  Musculoskeletal: Negative for myalgias and falls.  Skin: Negative for rash.  Neurological: Positive for weakness. Negative for dizziness, tingling, tremors, sensory change, speech change, focal weakness and loss of consciousness.  Endo/Heme/Allergies: Does not bruise/bleed easily.  Psychiatric/Behavioral: Negative for substance abuse. The patient is not nervous/anxious.   All other systems reviewed and are negative.   Labs:  Recent Labs  01/10/16 1732 01/10/16 2208 01/11/16 0403 01/11/16 0946  TROPONINI 0.03 0.04* 0.28* 0.49*   Lab Results  Component Value Date   WBC 6.9 01/10/2016   HGB 11.3* 01/10/2016   HCT 35.2 01/10/2016   MCV 87.4 01/10/2016   PLT 142* 01/10/2016    Recent Labs Lab 01/10/16 1513  NA 138  K 3.5  CL 106  CO2 21*  BUN 13  CREATININE 0.86  CALCIUM 9.1  PROT 7.5  BILITOT 1.4*  ALKPHOS 105  ALT 24  AST 29  GLUCOSE 203*   Lab Results  Component Value Date   CHOL 181 12/28/2015   HDL 55 12/28/2015   LDLCALC 105* 12/28/2015   TRIG 106 12/28/2015   Lab Results  Component Value Date   DDIMER  10/26/2007    0.24        AT THE INHOUSE ESTABLISHED CUTOFF VALUE OF 0.48 ug/mL FEU, THIS ASSAY HAS BEEN DOCUMENTED IN THE LITERATURE TO HAVE    Radiology/Studies:  Dg Chest 2 View  12/29/2015  CLINICAL DATA:  Shortness of breath.  Uterine cancer. EXAM: CHEST  2 VIEW COMPARISON:  12/27/2015 chest radiograph. FINDINGS: Low lung volumes. Stable cardiomediastinal silhouette with top-normal heart size. No pneumothorax. No pleural effusion. Stable mild-to-moderate elevation of the right hemidiaphragm. No pulmonary edema. Mild bibasilar atelectasis. No acute  consolidative airspace disease. IMPRESSION: Low lung volumes. Stable elevation of the right hemidiaphragm with mild bibasilar atelectasis. Electronically Signed   By: Ilona Sorrel M.D.   On: 12/29/2015 12:30   Dg Chest 2 View  12/27/2015  CLINICAL DATA:  Weakness.  Cough. EXAM: CHEST  2 VIEW COMPARISON:  Chest from abdominal radiographs earlier this day at 1744 hour FINDINGS: Unchanged elevation of right hemidiaphragm. There is adjacent atelectasis or scarring at the lung bases. Cardiomediastinal contours are unchanged with stable cardiomegaly. Probable vascular congestion. No evidence pulmonary edema. Trace left pleural effusion IMPRESSION: Unchanged elevation of right hemidiaphragm. Unchanged cardiomegaly. No new abnormality is seen. Electronically Signed   By: Jeb Levering M.D.   On: 12/27/2015 23:50   Dg Chest 2 View  12/17/2015  CLINICAL DATA:  Recent onset of cough; admitted yesterday; recently hospitalized for pneumonia; hx/o cirrhosis, htn, paf, and cancer of uterus EXAM: CHEST  2 VIEW COMPARISON:  12/16/2015 FINDINGS: Since the previous day's study, areas of airspace opacity have developed, most evident in the right upper lobe, but also noted in the right lower lobe and left lower lobe. No pleural effusion or pneumothorax. Cardiac silhouette is normal in size and configuration. No mediastinal or hilar masses or convincing adenopathy. Bony thorax is intact. IMPRESSION: 1. Multifocal pneumonia has become apparent since the previous day's study. Electronically Signed   By: Lajean Manes M.D.   On: 12/17/2015 12:23   Dg Abd 1 View  12/30/2015  CLINICAL DATA:  Ileus. EXAM: ABDOMEN - 1 VIEW COMPARISON:  12/29/2015. FINDINGS: Residual oral contrast and stool are seen in the ascending colon. Mild gaseous  distention of remaining colon, minimally improved. Gas is seen in nondistended small bowel. IMPRESSION: Minimal improvement in colonic ileus. Electronically Signed   By: Lorin Picket M.D.   On:  12/30/2015 08:36   Dg Abd 1 View  12/29/2015  CLINICAL DATA:  Ascending colitis with septic shock.  Ileus. EXAM: ABDOMEN - 1 VIEW COMPARISON:  None. FINDINGS: Gaseous distension of the colon identified. There is been interval increase in caliber of the large bowel loops with decrease and small bowel air. Enteric contrast material remains within the right colon. IMPRESSION: 1. Imaging findings compatible with colonic ileus. Electronically Signed   By: Kerby Moors M.D.   On: 12/29/2015 08:57   Ct Head Wo Contrast  12/27/2015  CLINICAL DATA:  Abdominal pain for 4 days, nausea, vomiting, diarrhea, onset of LEFT-sided weakness at 1430 hours, history TIA, atrial fibrillation, diabetes mellitus, paroxysmal atrial fibrillation, essential hypertension, uterine cancer EXAM: CT HEAD WITHOUT CONTRAST TECHNIQUE: Contiguous axial images were obtained from the base of the skull through the vertex without intravenous contrast. COMPARISON:  11/11/2015 FINDINGS: Mild atrophy. Normal ventricular morphology. No midline shift or mass effect. Otherwise normal appearance of brain parenchyma. No intracranial hemorrhage, mass lesion, or evidence acute infarction. No extra-axial fluid collections. Visualized paranasal sinuses and mastoid air cells clear. Mild hyperostosis frontalis interna. No acute osseous findings. IMPRESSION: No acute intracranial abnormalities. Findings called to Dr. Reita Cliche on 12/27/2015 at 1703 hours. Electronically Signed   By: Lavonia Dana M.D.   On: 12/27/2015 17:04   Ct Chest Wo Contrast  12/17/2015  CLINICAL DATA:  Diagnosed with pneumonia 1 month ago. Coughing and wheezing has increased over the last couple days. Shortness of breath today. EXAM: CT CHEST WITHOUT CONTRAST TECHNIQUE: Multidetector CT imaging of the chest was performed following the standard protocol without IV contrast. COMPARISON:  Chest x-rays dated 12/17/2015 and 11/14/2015. FINDINGS: Mediastinum/Lymph Nodes: Heart size is upper normal.  No pericardial effusion. Scattered coronary artery calcifications noted. No masses or enlarged lymph nodes seen within the mediastinum or perihilar regions. Lungs/Pleura: Patchy consolidations throughout both lungs, involving all lobes, most dense within the right upper lobe and at the left lung base posteriorly. Majority of the consolidations are associated with ground-glass opacities suggesting atypical pneumonia or associated edema. Trachea and central bronchi are unremarkable. Upper abdomen: Free ascites is seen within the upper abdomen. Liver appears cirrhotic. Patient is status post cholecystectomy. Thickening of the walls of the upper right colon appear similar to the appearance on CT abdomen of 12/16/2015 compatible with the description of colitis on the associated report. Musculoskeletal: Mild scoliosis of the thoracic spine. No evidence of acute osseous abnormality. Superficial soft tissues are unremarkable. IMPRESSION: 1. Multi lobar consolidations, most dense within the right upper lobe and at the left lung base posteriorly, with areas of associated ground-glass opacity suggesting atypical pneumonia and/or edema. Differential includes atypical pneumonias such as viral or fungal, interstitial pneumonias, edema related to volume overload/CHF, chronic interstitial diseases, hypersensitivity pneumonitis, and respiratory bronchiolitis. 2. Upper abdominal ascites, presumably associated with liver cirrhosis. Thickening of the walls of the upper ascending colon similar to its appearance on CT abdomen of 12/16/2015, compatible with the previous description of colitis on the earlier CT report. Electronically Signed   By: Franki Cabot M.D.   On: 12/17/2015 17:13   Ct Angio Chest Pe W/cm &/or Wo Cm  01/10/2016  CLINICAL DATA:  Tachycardia and chest pain, abdominal pain EXAM: CT ANGIOGRAPHY CHEST CT ABDOMEN AND PELVIS WITH CONTRAST TECHNIQUE: Multidetector CT  imaging of the chest was performed using the standard  protocol during bolus administration of intravenous contrast. Multiplanar CT image reconstructions and MIPs were obtained to evaluate the vascular anatomy. Multidetector CT imaging of the abdomen and pelvis was performed using the standard protocol during bolus administration of intravenous contrast. CONTRAST:  100 mL Isovue 370. COMPARISON:  12/17/2015 12/27/2015 FINDINGS: CTA CHEST FINDINGS The lungs are well aerated bilaterally. No focal infiltrate or sizable effusion is seen. The previously noted infiltrates have resolved in the interval. The thoracic inlet is within normal limits. The thoracic aorta and its branches are unremarkable. No dissection or aneurysm is identified. The pulmonary artery is well visualized and demonstrates a normal branching pattern. No filling defects to suggest pulmonary emboli are identified. No significant hilar or mediastinal adenopathy is seen. The osseous structures show no acute abnormality. CT ABDOMEN and PELVIS FINDINGS The liver is shrunken with significant nodularity consistent with underlying cirrhosis. The gallbladder has been surgically removed. Some very mild perigastric varices are noted extending into the distal esophagus. No significant perisplenic varices are seen. Changes suggestive of prior fundoplication are seen in the proximal stomach. Mild ascites is noted predominately in the right lower quadrant as well as surrounding the liver superiorly. The spleen is mildly enlarged. The adrenal glands and pancreas are within normal limits. The kidneys are well visualized bilaterally with a small staghorn type calculus in the lower pole of the right kidney. No obstructive changes are seen. Aortoiliac calcifications are noted without aneurysmal dilatation. The bladder is well distended. The appendix is within normal limits. No diverticulitis is seen. The osseous structures show no acute abnormality. Review of the MIP images confirms the above findings. IMPRESSION: No  evidence of pulmonary emboli. Significant clearing of previously seen bilateral infiltrates. Changes of cirrhosis with portal hypertension, ascites and varices. Stable right renal calculus. Electronically Signed   By: Inez Catalina M.D.   On: 01/10/2016 19:42   Mr Jodene Nam Head Wo Contrast  12/29/2015  CLINICAL DATA:  61 year old female with vomiting and dizziness for several days. Initial encounter. EXAM: MRI HEAD WITHOUT CONTRAST MRA HEAD WITHOUT CONTRAST TECHNIQUE: Multiplanar, multiecho pulse sequences of the brain and surrounding structures were obtained without intravenous contrast. Angiographic images of the head were obtained using MRA technique without contrast. COMPARISON:  Head CT without contrast 12/27/2015. Brain MRI 10/04/2015. FINDINGS: MRI HEAD FINDINGS Major intracranial vascular flow voids are stable. No restricted diffusion to suggest acute infarction. No midline shift, mass effect, evidence of mass lesion, ventriculomegaly, extra-axial collection or acute intracranial hemorrhage. Cervicomedullary junction and pituitary are within normal limits. Negative visualized cervical spine. Stable gray and white matter signal throughout the brain; scattered nonspecific cerebral white matter T2 and FLAIR hyperintensity. Visible internal auditory structures appear normal. Mild bilateral mastoid fluid is stable. Negative nasopharynx. Trace paranasal sinus mucosal thickening has not significantly changed. Orbit and scalp soft tissues are within normal limits. Normal bone marrow signal. MRA HEAD FINDINGS Antegrade flow in the posterior circulation with codominant distal vertebral arteries. Normal PICA origins and vertebrobasilar junction. Normal basilar artery, AICA origins, SCA origins, and left PCA origin. Fetal type right PCA origin. Left posterior communicating artery is diminutive or absent. Bilateral PCA branches are within normal limits. Antegrade flow in both ICA siphons. No siphon stenosis. Normal  ophthalmic and right posterior communicating artery origins. Normal carotid termini, MCA and ACA origins. Anterior communicating artery is diminutive. Visualized bilateral ACA and MCA branches are within normal limits. IMPRESSION: 1. No acute intracranial abnormality. Stable noncontrast  MRI appearance of the brain since January. 2.  Negative intracranial MRA. Electronically Signed   By: Genevie Ann M.D.   On: 12/29/2015 13:46   Mr Angiogram Neck W Wo Contrast  12/29/2015  CLINICAL DATA:  60 year old female with vomiting and dizziness for several days. Initial encounter. EXAM: MRA NECK WITHOUT AND WITH CONTRAST TECHNIQUE: Multiplanar and multiecho pulse sequences of the neck were obtained without and with intravenous contrast. Angiographic images of the neck were obtained using MRA technique without and with intravenous contrast. CONTRAST:  46m MULTIHANCE GADOBENATE DIMEGLUMINE 529 MG/ML IV SOLN COMPARISON:  Brain MRI and intracranial MRA from today reported separately. FINDINGS: Precontrast time-of-flight images reveal antegrade flow in both carotid and vertebral arteries throughout the neck. Carotid bifurcations appear normal. Vertebral arteries are codominant. Post-contrast neck MRA images reveal a 3 vessel arch configuration. Tortuous proximal great vessels but no great vessel origin stenosis. Tortuous proximal right CCA. Widely patent right carotid bifurcation. Negative cervical right ICA. Tortuous proximal left CCA. Mild irregularity at the left carotid bifurcation primarily affecting the ECA origin. Widely patent left ICA origin and bulb. Negative cervical left ICA. Normal vertebral artery origins. Mildly dominant right vertebral artery throughout the neck. No vertebral artery stenosis to the vertebrobasilar junction. IMPRESSION: Negative neck MRA aside from tortuosity of the great vessels. Electronically Signed   By: HGenevie AnnM.D.   On: 12/29/2015 14:01   Mr Brain Wo Contrast  12/29/2015  CLINICAL DATA:   60year old female with vomiting and dizziness for several days. Initial encounter. EXAM: MRI HEAD WITHOUT CONTRAST MRA HEAD WITHOUT CONTRAST TECHNIQUE: Multiplanar, multiecho pulse sequences of the brain and surrounding structures were obtained without intravenous contrast. Angiographic images of the head were obtained using MRA technique without contrast. COMPARISON:  Head CT without contrast 12/27/2015. Brain MRI 10/04/2015. FINDINGS: MRI HEAD FINDINGS Major intracranial vascular flow voids are stable. No restricted diffusion to suggest acute infarction. No midline shift, mass effect, evidence of mass lesion, ventriculomegaly, extra-axial collection or acute intracranial hemorrhage. Cervicomedullary junction and pituitary are within normal limits. Negative visualized cervical spine. Stable gray and white matter signal throughout the brain; scattered nonspecific cerebral white matter T2 and FLAIR hyperintensity. Visible internal auditory structures appear normal. Mild bilateral mastoid fluid is stable. Negative nasopharynx. Trace paranasal sinus mucosal thickening has not significantly changed. Orbit and scalp soft tissues are within normal limits. Normal bone marrow signal. MRA HEAD FINDINGS Antegrade flow in the posterior circulation with codominant distal vertebral arteries. Normal PICA origins and vertebrobasilar junction. Normal basilar artery, AICA origins, SCA origins, and left PCA origin. Fetal type right PCA origin. Left posterior communicating artery is diminutive or absent. Bilateral PCA branches are within normal limits. Antegrade flow in both ICA siphons. No siphon stenosis. Normal ophthalmic and right posterior communicating artery origins. Normal carotid termini, MCA and ACA origins. Anterior communicating artery is diminutive. Visualized bilateral ACA and MCA branches are within normal limits. IMPRESSION: 1. No acute intracranial abnormality. Stable noncontrast MRI appearance of the brain since  January. 2.  Negative intracranial MRA. Electronically Signed   By: HGenevie AnnM.D.   On: 12/29/2015 13:46   Ct Abdomen Pelvis W Contrast  01/10/2016  CLINICAL DATA:  Tachycardia and chest pain, abdominal pain EXAM: CT ANGIOGRAPHY CHEST CT ABDOMEN AND PELVIS WITH CONTRAST TECHNIQUE: Multidetector CT imaging of the chest was performed using the standard protocol during bolus administration of intravenous contrast. Multiplanar CT image reconstructions and MIPs were obtained to evaluate the vascular anatomy. Multidetector CT imaging of  the abdomen and pelvis was performed using the standard protocol during bolus administration of intravenous contrast. CONTRAST:  100 mL Isovue 370. COMPARISON:  12/17/2015 12/27/2015 FINDINGS: CTA CHEST FINDINGS The lungs are well aerated bilaterally. No focal infiltrate or sizable effusion is seen. The previously noted infiltrates have resolved in the interval. The thoracic inlet is within normal limits. The thoracic aorta and its branches are unremarkable. No dissection or aneurysm is identified. The pulmonary artery is well visualized and demonstrates a normal branching pattern. No filling defects to suggest pulmonary emboli are identified. No significant hilar or mediastinal adenopathy is seen. The osseous structures show no acute abnormality. CT ABDOMEN and PELVIS FINDINGS The liver is shrunken with significant nodularity consistent with underlying cirrhosis. The gallbladder has been surgically removed. Some very mild perigastric varices are noted extending into the distal esophagus. No significant perisplenic varices are seen. Changes suggestive of prior fundoplication are seen in the proximal stomach. Mild ascites is noted predominately in the right lower quadrant as well as surrounding the liver superiorly. The spleen is mildly enlarged. The adrenal glands and pancreas are within normal limits. The kidneys are well visualized bilaterally with a small staghorn type calculus in the  lower pole of the right kidney. No obstructive changes are seen. Aortoiliac calcifications are noted without aneurysmal dilatation. The bladder is well distended. The appendix is within normal limits. No diverticulitis is seen. The osseous structures show no acute abnormality. Review of the MIP images confirms the above findings. IMPRESSION: No evidence of pulmonary emboli. Significant clearing of previously seen bilateral infiltrates. Changes of cirrhosis with portal hypertension, ascites and varices. Stable right renal calculus. Electronically Signed   By: Inez Catalina M.D.   On: 01/10/2016 19:42   Ct Abdomen Pelvis W Contrast  12/27/2015  CLINICAL DATA:  Vomiting and dizziness. Abdominal pain. Weakness. Lightheadedness. Cirrhosis. EXAM: CT ABDOMEN AND PELVIS WITH CONTRAST TECHNIQUE: Multidetector CT imaging of the abdomen and pelvis was performed using the standard protocol following bolus administration of intravenous contrast. CONTRAST:  171m ISOVUE-300 IOPAMIDOL (ISOVUE-300) INJECTION 61% COMPARISON:  Multiple exams, including 12/16/2015 CT scan FINDINGS: Lower chest: Mild interstitial accentuation in both lower lobes, similar to 12/16/2015. Cardiomegaly with left ventricular hypertrophy. Trace left pleural effusion. Trace pericardial effusion. Small uphill varices adjacent to the distal esophagus. Hepatobiliary: Advanced cirrhosis. No focal liver mass seen. Cholecystectomy. Pancreas: Unremarkable Spleen: Unremarkable Adrenals/Urinary Tract: Adrenal glands normal. Right kidney lower pole 9 mm calculus, not appreciably changed. Small hypodense lesions of the right kidney are statistically likely to be cysts but technically too small to characterize. No hydronephrosis, ureteral calculus, or bladder calculus seen. Stomach/Bowel: Wall thickening of the stomach along the gastric cardia, somewhat resembling a fundoplication, correlate with operative history. Several mildly dilated loops of jejunum in the left  abdomen, without a transition point to suggest obstruction. Equivocal wall thickening in the ascending colon. Vascular/Lymphatic: Aortoiliac atherosclerotic vascular disease. Gastric varices. Splenorenal shunting. Portal vein and splenic vein patent. No pathologic adenopathy identified. Reproductive: Uterus absent.  Ovaries not well seen. Other: Considerable ascites, increased from the exam 11 days ago. Increased mesenteric and subcutaneous edema. Musculoskeletal: Healing fractures the right anterior fourth, fifth, sixth, seventh, and eighth ribs with associated rib sclerosis, not appreciably changed from 12/16/2015. IMPRESSION: 1. Compared to the exam from 11 days ago, there is worsening third spacing of fluid with diffuse mesenteric and subcutaneous edema along with increase in ascites. 2. Cirrhosis. 3. Trace left pleural effusion and trace pericardial effusion. 4. Healing anterior fourth through  eighth rib fractures on the right. 5. Cardiomegaly with left ventricular hypertrophy. 6. The uphill esophageal varices and gastric varices. Splenorenal shunting. 7. Nonobstructive right kidney lower pole 9 mm calculus. 8. Wall thickening along the gastric cardia, query prior fundoplication. 9. Equivocal wall thickening in the ascending colon, possibly from third spacing of fluid or mild proximal colitis. 10.  Aortoiliac atherosclerotic vascular disease. Electronically Signed   By: Van Clines M.D.   On: 12/27/2015 21:19   Ct Abdomen Pelvis W Contrast  12/16/2015  CLINICAL DATA:  Abdominal pain, nausea, vomiting and diarrhea. EXAM: CT ABDOMEN AND PELVIS WITH CONTRAST TECHNIQUE: Multidetector CT imaging of the abdomen and pelvis was performed using the standard protocol following bolus administration of intravenous contrast. CONTRAST:  161m ISOVUE-300 IOPAMIDOL (ISOVUE-300) INJECTION 61% COMPARISON:  Abdominal series today as well as prior CT of the abdomen and pelvis on 11/14/2015. FINDINGS: Lower chest: The  visualized lung bases show bibasilar atelectasis. No pleural fluid identified. Hepatobiliary: There again is evidence of cirrhosis with grossly nodular appearance of the liver. There is associated ascites surrounding the liver and also elsewhere in the peritoneal cavity. Overall volume of ascites is small to moderate. There is evidence of portal hypertension with distal esophageal and gastroesophageal varices identified supplied by the left gastric vein as well as probable supply from short gastric veins and a gastro-renal shunt emanating from the left renal vein. Pancreas: No mass, inflammatory changes, or other significant abnormality. Spleen: Within normal limits in size and appearance. Adrenals/Urinary Tract: No evidence of hydronephrosis. Nonobstructing calculus in the lower pole of the right kidney present measuring approximately 10 mm in greatest diameter. Stomach/Bowel: There is evidence of new colitis with significant circumferential thickening involving the ascending and transverse colon. Colonic thickening appears extends just into the proximal descending colon. No evidence of bowel perforation or focal abscess. No small bowel dilatation or obstruction. The stomach continues to show evidence of prominent folds at the level of the gastric cardia and GE junction extending into the body of the stomach. Prominence at the level of the gastric cardia is more pronounced compared to prior studies and it may help to correlate this finding with repeat EGD. Vascular/Lymphatic: No pathologically enlarged lymph nodes. No evidence of abdominal aortic aneurysm. Reproductive: The uterus has been removed. Other: No hernias identified. Musculoskeletal: Bony structures show stable spondylosis at L5-S1 a mild leftward convex scoliosis of the lumbar spine. IMPRESSION: 1. New colitis involving the ascending and transverse colon. No evidence of bowel perforation, focal abscess or small bowel obstruction. 2. Cirrhosis and portal  hypertension with distal esophageal and gastroesophageal varices identified supplied by probable left gastric and gastro-renal shunts. 3. Some increased prominence of folds at the level of the gastric cardia extending into the body of the stomach. Some of these changes were apparently secondary to prior fundoplication and gastric inflammation by prior EGD. Correlation with repeat EGD may be helpful, especially given the CT evidence of definite distal esophageal varices. Electronically Signed   By: GAletta EdouardM.D.   On: 12/16/2015 15:48   Dg Chest Portable 1 View  01/10/2016  CLINICAL DATA:  60year old female with tachycardia and chest pain for 3 days with shortness of breath and weakness. Initial encounter. EXAM: PORTABLE CHEST 1 VIEW COMPARISON:  12/29/2015 and earlier. FINDINGS: Portable AP upright view at 1532 hours. Low but improved lung volumes. Stable cardiac size and mediastinal contours. Allowing for portable technique, the lungs are clear. No pneumothorax or pleural effusion. Calcified aortic atherosclerosis. IMPRESSION: No  acute cardiopulmonary abnormality. Electronically Signed   By: Genevie Ann M.D.   On: 01/10/2016 15:46   Dg Abd Acute W/chest  12/27/2015  CLINICAL DATA:  Upper abdominal pain for the past 4 days. History of atrial fibrillation. EXAM: DG ABDOMEN ACUTE W/ 1V CHEST COMPARISON:  12/17/2015; 12/16/2015 FINDINGS: Grossly unchanged enlarged cardiac silhouette and mediastinal contours given persistently reduced lung volumes. Minimal bilateral infrahilar opacities favored to represent atelectasis. Mild pulmonary venous congestion without frank evidence of edema. No pleural effusion or pneumothorax. Mild gas distention of multiple loops of small bowel without definitive evidence of enteric obstruction. Unremarkable colonic stool burden. No pneumoperitoneum, pneumatosis or portal venous gas. Indeterminate punctate (approximately 5 mm) opacity overlying the right mid abdomen. Several  phleboliths overlie the lower pelvis bilaterally. Mild to moderate scoliotic curvature of the thoracolumbar spine with dominant caudal component convex to the left. No acute osseous abnormalities. IMPRESSION: 1. Cardiomegaly and pulmonary venous congestion without definitive evidence of acute cardiopulmonary disease on this hypoventilated examination. 2. Nonspecific mild gaseous distention of several loops of small bowel without definite evidence of enteric obstruction. 3. Indeterminate punctate (approximately 5 mm) opacity overlies the right mid abdomen, while potentially radiopaque ingested debris, potentially renal stone could have a similar appearance. Clinical correlation is advised. Further evaluation with noncontrast abdominal CT could be performed as indicated. Electronically Signed   By: Sandi Mariscal M.D.   On: 12/27/2015 18:12   Dg Abd Acute W/chest  12/16/2015  CLINICAL DATA:  60 year old female with abdominal pain nausea vomiting and diarrhea for 4 days. Initial encounter. EXAM: DG ABDOMEN ACUTE W/ 1V CHEST COMPARISON:  CT Abdomen and Pelvis 11/14/2015 and earlier. FINDINGS: Upright AP view of the chest. Improved lung volumes and resolved left lung base effusion and opacity. No pneumothorax or pneumoperitoneum. Normal cardiac size and mediastinal contours. No new pulmonary opacity. Moderate gaseous distension of the stomach. Mild paucity of bowel gas elsewhere, and somewhat featureless appearance of gas-filled loops. There is gas throughout nondilated descending and proximal sigmoid colon. S-shaped scoliosis. No acute osseous abnormality identified. Stable cholecystectomy clips. IMPRESSION: 1. No abdominal free air. Moderately distended stomach with nonobstructed bowel-gas pattern otherwise. The somewhat featureless appearance of bowel loops however might reflect ileus. 2. Resolved left pleural effusion and airspace disease since March. No acute cardiopulmonary abnormality. Electronically Signed   By: Genevie Ann M.D.   On: 12/16/2015 10:58   Dg Abd Portable 1v  12/28/2015  CLINICAL DATA:  Distended abdomen EXAM: PORTABLE ABDOMEN - 1 VIEW COMPARISON:  CT 12/27/2015 FINDINGS: Contrast in the right colon from prior CT. Gas in large and small bowel compatible with ileus. Negative for bowel obstruction Contrast in urinary bladder from recent CT scan. Lumbar levoscoliosis. IMPRESSION: Mild ileus. Nonobstructive bowel gas pattern. Contrast in the right colon from CT yesterday. Electronically Signed   By: Franchot Gallo M.D.   On: 12/28/2015 09:37    EKG: sinus tachycardia, 108 bpm, nonspecific inferior st/t changes, old anterior infarct  Weights: Filed Weights   01/10/16 1444 01/10/16 2152 01/11/16 0500  Weight: 131 lb (59.421 kg) 132 lb 1.6 oz (59.92 kg) 130 lb 11.2 oz (59.285 kg)     Physical Exam: Blood pressure 125/64, pulse 98, temperature 97.6 F (36.4 C), temperature source Oral, resp. rate 18, height 5' 3"  (1.6 m), weight 130 lb 11.2 oz (59.285 kg), SpO2 93 %. Body mass index is 23.16 kg/(m^2). General: Well developed, well nourished, in no acute distress. Head: Normocephalic, atraumatic, sclera non-icteric, no xanthomas, nares  are without discharge.  Neck: Negative for carotid bruits. JVD not elevated. Lungs: Clear bilaterally to auscultation without wheezes, rales, or rhonchi. Breathing is unlabored. Heart: Tachycardic, with S1 S2. No murmurs, rubs, or gallops appreciated. Abdomen: Soft, non-tender, non-distended with normoactive bowel sounds. No hepatomegaly. No rebound/guarding. No obvious abdominal masses. Msk:  Strength and tone appear normal for age. Extremities: No clubbing or cyanosis. No edema.  Distal pedal pulses are 2+ and equal bilaterally. Neuro: Alert and oriented X 3. No facial asymmetry. No focal deficit. Moves all extremities spontaneously. Psych:  Responds to questions appropriately with a normal affect.    Assessment and Plan:   1. NSTEMI: -Hold Eliquis -Start  heparin gtt -Schedule cardiac cath with Dr. Fletcher Anon, MD for the PM of 5/4 (this will be 36 hours off Eliquis) -Aspirin, increase Lopressor to 75 mg bid, nitro paste in an effort to make her CP free -Risks and benefits of cardiac catheterization have been discussed with the patient including risks of bleeding, bruising, infection, kidney damage, stroke, heart attack, and death. The patient understands these risks and is willing to proceed with the procedure. All questions have been answered and concerns listened to.   2. Accelerated HTN: -Increase Lopressor as above to 75 mg bid (this will also aid with her sinus tach, though suspect some of this may be 2/2 her intractable N/V/D -Add nitro paste -Continue remaining antihypertensives  3. Intractable N/V/D: -Per IM  4. PAF: -Currently in sinus tachycardia -Eliquis has been held as above -Heparin gtt -Lopressor as above -CHADS2VASc at least 5 (HTN, diabetes, TIA/stroke x 2, female)   Signed, Christell Faith, PA-C Pager: [phone] 01/11/2016, 12:16 PM   I have seen, examined and evaluated the patient this afternoon along with Christell Faith, PA-C during morning rounds.  After reviewing all the available data and chart,  I agree with his findings, examination as well as impression recommendations as discussed on rounds this morning.  Complex situation with the patient having known history of PAF on Eliquis. Was recently in the hospital requiring amiodarone for conversion to sinus rhythm. Currently she is in sinus tachycardia not A. fib. She is also had a history of intractable nausea She presented this time with a complaint of chest discomfort that was Epigastric in nature associated with nausea and some recurrence of her vomiting. Somewhat unexpectedly her troponin levels have increased in a slow trend to 0.49. She was complaining about some chest discomfort is more epigastric in nature and reproducible on exam. However it was 10 out of 10 on her  initial complaint and then is reduced somewhat with medical management.  Her symptomatic status seems relatively atypical for angina, however with the troponin trend upward in absence of other etiology to explain it, I think we need to consider this possible ACS/non-STEMI. She does have risk factors including hypertension and diabetes as well as a history of stroke.  I agree with increasing beta blocker dose in the setting of tachycardia (she has adequate blood pressure), will then also use Nitropaste. She is on heparin already and Eliquis being held. We will discuss the possibility of cardiac catheterization tomorrow with Dr. Fletcher Anon. She has not had a recent ischemic evaluation with her diagnosis of atrial fibrillation.  Past radiating to the medications, she is on beta blocker and calcium channel blocker. She is also on moderate dose statin.   I discussed the left heart catheterization procedure with the patient in detail. Questions were answered.  Risks / Complications include, but  not limited to: Death, MI, CVA/TIA, VF/VT (with defibrillation), Bradycardia (need for temporary pacer placement), contrast induced nephropathy, bleeding / bruising / hematoma / pseudoaneurysm, vascular or coronary injury (with possible emergent CT or Vascular Surgery), adverse medication reactions, infection.  Additional risks involving the use of radiation with the possibility of radiation burns and cancer were explained in detail.  The patient voices understanding and agrees to proceed.   Will plan for tomorrow morning.     Leonie Man, M.D., M.S. Interventional Cardiologist   Pager # [phone] Phone # [phone] [address] [place], [place] [address]

## 2016-01-11 NOTE — Progress Notes (Signed)
Inpatient Diabetes Program Recommendations  AACE/ADA: New Consensus Statement on Inpatient Glycemic Control (2015)  Target Ranges:  Prepandial:   less than 140 mg/dL      Peak postprandial:   less than 180 mg/dL (1-2 hours)      Critically ill patients:  140 - 180 mg/dL   Review of Glycemic Control Results for [name], [name] (MRN [id number]) as of 01/11/2016 08:28  Ref. Range 01/10/2016 22:24 01/11/2016 07:19  Glucose-Capillary Latest Ref Range: 65-99 mg/dL 297 (H) 257 (H)   Diabetes history: Type 2  Outpatient Diabetes medications:Levemir 17 units q day, Novolog tid with meals Current orders for Inpatient glycemic control: Levemir 12 units QHS, Novolog 0-9 units TID with meals  Inpatient Diabetes Program Recommendations:  Please consider increase in Levemir to 17 units as home dose and add Novolog Correction 0-5 hs coverage. Will follow.  Thank you, Nani Gasser. Myking Sar, RN, MSN, CDE Inpatient Glycemic Control Team Team Pager [phone] (8am-5pm) 01/11/2016 8:34 AM

## 2016-01-11 NOTE — Care Management Obs Status (Signed)
[place] NOTIFICATION   Patient Details  Name: [name] MRN: [id number] Date of Birth: 22-Dec-1955   Medicare Observation Status Notification Given:  Yes    Jolly Mango, RN 01/11/2016, 12:14 PM

## 2016-01-12 ENCOUNTER — Encounter
Admission: EM | Disposition: A | Discharge status: Home / Self Care | Payer: Self-pay | Source: Home / Self Care | Attending: Internal Medicine

## 2016-01-12 ENCOUNTER — Inpatient Hospital Stay: Payer: Commercial Managed Care - HMO

## 2016-01-12 ENCOUNTER — Encounter: Payer: Self-pay | Admitting: Cardiovascular Disease

## 2016-01-12 HISTORY — PX: CARDIAC CATHETERIZATION: SHX172

## 2016-01-12 LAB — CBC
HCT: 30.1 % — ABNORMAL LOW (ref 35.0–47.0)
Hemoglobin: 9.7 g/dL — ABNORMAL LOW (ref 12.0–16.0)
MCH: 27.8 pg (ref 26.0–34.0)
MCHC: 32.3 g/dL (ref 32.0–36.0)
MCV: 86.1 fL (ref 80.0–100.0)
PLATELETS: 161 10*3/uL (ref 150–440)
RBC: 3.5 MIL/uL — ABNORMAL LOW (ref 3.80–5.20)
RDW: 16 % — AB (ref 11.5–14.5)
WBC: 10 10*3/uL (ref 3.6–11.0)

## 2016-01-12 LAB — APTT
aPTT: 42 seconds — ABNORMAL HIGH (ref 24–36)
aPTT: 31 seconds (ref 24–36)

## 2016-01-12 LAB — HEPARIN LEVEL (UNFRACTIONATED): Heparin Unfractionated: 1.52 IU/mL — ABNORMAL HIGH (ref 0.30–0.70)

## 2016-01-12 LAB — GLUCOSE, CAPILLARY
Glucose-Capillary: 81 mg/dL (ref 65–99)
Glucose-Capillary: 84 mg/dL (ref 65–99)
Glucose-Capillary: 104 mg/dL — ABNORMAL HIGH (ref 65–99)
Glucose-Capillary: 181 mg/dL — ABNORMAL HIGH (ref 65–99)

## 2016-01-12 LAB — TROPONIN I: Troponin I: 0.19 ng/mL — ABNORMAL HIGH (ref <0.031)

## 2016-01-12 LAB — MRSA PCR SCREENING: MRSA BY PCR: NEGATIVE

## 2016-01-12 SURGERY — LEFT HEART CATH AND CORONARY ANGIOGRAPHY
Anesthesia: Moderate Sedation

## 2016-01-12 MED ORDER — ASPIRIN 81 MG PO CHEW: 81.0000 mg | CHEWABLE_TABLET | ORAL | Status: DC

## 2016-01-12 MED ORDER — SODIUM CHLORIDE 0.9 % WEIGHT BASED INFUSION: 1.0000 mL/kg/h | INTRAVENOUS | Status: DC

## 2016-01-12 MED ORDER — SODIUM CHLORIDE 0.9% FLUSH: 3.0000 mL | INTRAVENOUS | Status: DC | PRN

## 2016-01-12 MED ORDER — FENTANYL CITRATE (PF) 100 MCG/2ML IJ SOLN
INTRAMUSCULAR | Status: AC
  Filled 2016-01-12: qty 2

## 2016-01-12 MED ORDER — HEPARIN SODIUM (PORCINE) 1000 UNIT/ML IJ SOLN
INTRAMUSCULAR | Status: AC
  Filled 2016-01-12: qty 1

## 2016-01-12 MED ORDER — MIDAZOLAM HCL 2 MG/2ML IJ SOLN
INTRAMUSCULAR | Status: AC
  Filled 2016-01-12: qty 2

## 2016-01-12 MED ORDER — HEPARIN (PORCINE) IN NACL 2-0.9 UNIT/ML-% IJ SOLN
INTRAMUSCULAR | Status: AC
  Filled 2016-01-12: qty 1000

## 2016-01-12 MED ORDER — SODIUM CHLORIDE 0.9 % IV SOLN: 250.0000 mL | INTRAVENOUS | Status: DC | PRN

## 2016-01-12 MED ORDER — HEPARIN (PORCINE) IN NACL 100-0.45 UNIT/ML-% IJ SOLN
600.0000 [IU]/h | INTRAMUSCULAR | Status: DC
  Administered 2016-01-12: 500 [IU]/h via INTRAVENOUS

## 2016-01-12 MED ORDER — SODIUM CHLORIDE 0.9 % WEIGHT BASED INFUSION: 3.0000 mL/kg/h | INTRAVENOUS | Status: DC

## 2016-01-12 MED ORDER — SODIUM CHLORIDE 0.9% FLUSH
3.0000 mL | Freq: Two times a day (BID) | INTRAVENOUS | Status: DC
  Administered 2016-01-12: 3 mL via INTRAVENOUS

## 2016-01-12 MED ORDER — ASPIRIN 81 MG PO CHEW
81.0000 mg | CHEWABLE_TABLET | Freq: Once | ORAL | Status: AC
  Administered 2016-01-12: 81 mg via ORAL
  Filled 2016-01-12: qty 1

## 2016-01-12 MED ORDER — FENTANYL CITRATE (PF) 100 MCG/2ML IJ SOLN
INTRAMUSCULAR | Status: DC | PRN
  Administered 2016-01-12 (×2): 25 ug via INTRAVENOUS

## 2016-01-12 MED ORDER — MIDAZOLAM HCL 2 MG/2ML IJ SOLN
INTRAMUSCULAR | Status: DC | PRN
  Administered 2016-01-12 (×2): 1 mg via INTRAVENOUS

## 2016-01-12 MED ORDER — IOPAMIDOL (ISOVUE-300) INJECTION 61%
INTRAVENOUS | Status: DC | PRN
  Administered 2016-01-12: 80 mL via INTRA_ARTERIAL

## 2016-01-12 MED ORDER — VERAPAMIL HCL 2.5 MG/ML IV SOLN
INTRAVENOUS | Status: AC
  Filled 2016-01-12: qty 2

## 2016-01-12 MED ORDER — APIXABAN 5 MG PO TABS
5.0000 mg | ORAL_TABLET | Freq: Two times a day (BID) | ORAL | Status: DC
  Administered 2016-01-13: 5 mg via ORAL
  Filled 2016-01-12: qty 1

## 2016-01-12 MED ORDER — SODIUM CHLORIDE 0.9% FLUSH
3.0000 mL | Freq: Two times a day (BID) | INTRAVENOUS | Status: DC
  Administered 2016-01-12 (×2): 3 mL via INTRAVENOUS

## 2016-01-12 SURGICAL SUPPLY — 14 items
CATH INFINITI 5FR ANG PIGTAIL (CATHETERS) ×2 IMPLANT
CATH INFINITI 5FR JL4 (CATHETERS) ×2 IMPLANT
CATH INFINITI JR4 5F (CATHETERS) ×3 IMPLANT
CATH OPTITORQUE JACKY 4.0 5F (CATHETERS) ×3 IMPLANT
DEVICE CLOSURE MYNXGRIP 5F (Vascular Products) ×3 IMPLANT
DEVICE RAD TR BAND REGULAR (VASCULAR PRODUCTS) ×2 IMPLANT
GLIDESHEATH SLEND SS 6F .021 (SHEATH) ×3 IMPLANT
KIT MANI 3VAL PERCEP (MISCELLANEOUS) ×3 IMPLANT
NEEDLE PERC 18GX7CM (NEEDLE) ×3 IMPLANT
PACK CARDIAC CATH (CUSTOM PROCEDURE TRAY) ×3 IMPLANT
SHEATH AVANTI 5FR X 11CM (SHEATH) ×2 IMPLANT
WIRE EMERALD 3MM-J .035X150CM (WIRE) ×2 IMPLANT
WIRE HITORQ VERSACORE ST 145CM (WIRE) ×2 IMPLANT
WIRE SAFE-T 1.5MM-J .035X260CM (WIRE) ×3 IMPLANT

## 2016-01-12 NOTE — Progress Notes (Addendum)
Report called to [place] in ICU. Pt tx from cath lab to ICU. No further concerns at this time.

## 2016-01-12 NOTE — Progress Notes (Signed)
ANTICOAGULATION CONSULT NOTE - Follow Up  Pharmacy Consult for Heparin Drip Indication: chest pain/ACS  Allergies  Allergen Reactions  . Hydrocodone Other (See Comments)    Pt states that this medication caused cirrhosis of the liver.    . Erythromycin Other (See Comments)    Reaction:  Fever   . Prednisone Other (See Comments)    Reaction:  Unknown   . Rosiglitazone Maleate Swelling  . Codeine Sulfate Rash  . Tetanus-Diphtheria Toxoids Td Rash and Other (See Comments)    Reaction:  Fever     Patient Measurements: Height: 5' 3"  (160 cm) Weight: 127 lb 11.2 oz (57.924 kg) IBW/kg (Calculated) : 52.4 Heparin Dosing Weight: 59.3 kg  Vital Signs: Temp: 98.8 F (37.1 C) (05/04 0326) Temp Source: Oral (05/04 0326) BP: 128/75 mmHg (05/04 0326) Pulse Rate: 82 (05/04 0326)  Labs:  Recent Labs  01/10/16 1513  01/11/16 1553 01/11/16 2220 01/12/16 0330 01/12/16 0751  HGB 11.3*  --   --   --  9.7*  --   HCT 35.2  --   --   --  30.1*  --   PLT 142*  --   --   --  161  --   APTT  --   --  36  --   --  42*  LABPROT  --   --  20.1*  --   --   --   INR  --   --  1.71  --   --   --   HEPARINUNFRC  --   --   --  1.52*  --   --   CREATININE 0.86  --   --   --   --   --   TROPONINI 0.03  < > 0.42* 0.26* 0.19*  --   < > = values in this interval not displayed.  Estimated Creatinine Clearance: 56.1 mL/min (by C-G formula based on Cr of 0.86).   Medical History: Past Medical History  Diagnosis Date  . Type 1 diabetes ([place])   . Hypothyroid   . Degenerative disk disease   . Stomach ulcer   . Diverticulitis   . Syncope 01/2015  . Anxiety   . GERD (gastroesophageal reflux disease)   . History of hiatal hernia   . Cancer ([facility])     HX OF CANCER OF UTERUS   . TIA (transient ischemic attack) 02/2015  . Hypertension   . Pancreatitis   . PAF (paroxysmal atrial fibrillation) ([place]) 03/2015    a. new onset 03/2015 in setting of intractable N/V; b. on Eliquis 5 mg bid; c. CHADSVASc 4  (DM, TIA x 2, female)  . Intussusception intestine ([place]) 05/2015  . Stroke ([place])   . Allergy   . Cirrhosis of liver not due to alcohol ([place]) 2016    Medications:  Scheduled:  . amLODipine  5 mg Oral Daily  . atorvastatin  40 mg Oral QHS  . citalopram  20 mg Oral Daily  . dicyclomine  10 mg Oral TID AC & HS  . docusate sodium  100 mg Oral BID  . insulin aspart  0-5 Units Subcutaneous QHS  . insulin aspart  0-9 Units Subcutaneous TID WC  . insulin detemir  17 Units Subcutaneous QHS  . levothyroxine  100 mcg Oral QAC breakfast  . magnesium oxide  400 mg Oral BID  . metoCLOPramide  5 mg Oral TID AC & HS  . metoprolol tartrate  75 mg Oral BID  .  mometasone-formoterol  2 puff Inhalation BID  . morphine  15 mg Oral Q12H  . nitroGLYCERIN  1 inch Topical Q6H  . pantoprazole  40 mg Oral BID AC  . rifaximin  550 mg Oral BID  . sodium chloride flush  3 mL Intravenous Q12H  . sodium chloride flush  3 mL Intravenous Q12H   Infusions:  . sodium chloride 100 mL/hr at 01/12/16 0337  . [START ON 01/13/2016] sodium chloride     Followed by  . [START ON 01/13/2016] sodium chloride    . heparin 500 Units/hr (01/12/16 3300)    Assessment: Pharmacy consulted to start heparin drip and titrate in a 60 yo female for STEMI/ACS. Patient with afib and takes Eliquis 5 mg po BID.  Patient received a dose of Eliquis at 2214 on 5/2.  Lovenox 60 mg subq q12h was then ordered and patient received a dose of Lovenox 60 mg subq at 0613 today.    INR: 1.71, aPTT: 36, HL at 2220 on  5/3: 1.52 APTT on 5/4 at 0751: 42   Goal of Therapy:  APTT: 66-102 seconds Heparin level 0.3-0.7 units/ml Monitor platelets by anticoagulation protocol: Yes   Plan:   Patient received a dose of apixaban at 2214 on 5/2.  High HL may be related to recent apixaban use.  APTT value this AM is below goal.  Will increase heparin drip to 600 units/hr and recheck aPTT in 6 hours at 1430.  HL ordered in AM along with CBC.  Once aPTT levels  and HL correlate, may use HLs only for monitoring.    Pharmacy will continue to follow.   Morry Veiga G 01/12/2016,8:33 AM

## 2016-01-12 NOTE — Progress Notes (Signed)
mynx

## 2016-01-12 NOTE — Progress Notes (Addendum)
[facility] NAME: [name]    MR#:  [id number]  DATE OF BIRTH:  08-13-1956  SUBJECTIVE:   Presented from Dr Tyrone Sage office With poor by mouth intake, tachycardia and nausea. Patient was seen in the specialty recovery after cardiac catheter she had an episode of unresponsiveness transiently with heart rate elevated/SVT. Patient's vitals stable. She is having some left upper and lower extremity weakness which appears to be chronic. She is hemodynamically stable.  REVIEW OF SYSTEMS:    Review of Systems  Constitutional: Negative for fever, chills and malaise/fatigue.  HENT: Negative for ear discharge, ear pain, hearing loss, nosebleeds and sore throat.   Eyes: Negative for blurred vision and pain.  Respiratory: Negative for cough, hemoptysis, shortness of breath and wheezing.   Cardiovascular: Negative for chest pain, palpitations and leg swelling.  Gastrointestinal: Positive for nausea, abdominal pain and diarrhea. Negative for vomiting and blood in stool.  Genitourinary: Negative for dysuria.  Musculoskeletal: Negative for back pain.  Neurological: Positive for focal weakness and weakness. Negative for dizziness, tremors, speech change, seizures and headaches.       Chronic left upper and lower extremity weakness.  Endo/Heme/Allergies: Does not bruise/bleed easily.  Psychiatric/Behavioral: Negative for depression, suicidal ideas and hallucinations.   DRUG ALLERGIES:   Allergies  Allergen Reactions  . Hydrocodone Other (See Comments)    Pt states that this medication caused cirrhosis of the liver.    . Erythromycin Other (See Comments)    Reaction:  Fever   . Prednisone Other (See Comments)    Reaction:  Unknown   . Rosiglitazone Maleate Swelling  . Codeine Sulfate Rash  . Tetanus-Diphtheria Toxoids Td Rash and Other (See Comments)    Reaction:  Fever     VITALS:  Blood pressure 169/82, pulse 95, temperature 98.6 F (37  C), temperature source Oral, resp. rate 18, height 5' 3"  (1.6 m), weight 57.924 kg (127 lb 11.2 oz), SpO2 100 %.  PHYSICAL EXAMINATION:   Physical Exam  Constitutional: She is well-developed, well-nourished, and in no distress. No distress.  HENT:  Head: Normocephalic and atraumatic.  Eyes: Conjunctivae are normal. Pupils are equal, round, and reactive to light. No scleral icterus.  Neck: Normal range of motion. Neck supple. No JVD present. No tracheal deviation present.  Cardiovascular: Normal rate, regular rhythm and normal heart sounds.  Exam reveals no gallop and no friction rub.   No murmur heard. Pulmonary/Chest: Effort normal and breath sounds normal. No respiratory distress. She has no wheezes. She has no rales. She exhibits no tenderness.  Abdominal: Soft. Bowel sounds are normal. She exhibits no distension and no mass. There is no tenderness. There is no rebound and no guarding.  Musculoskeletal: Normal range of motion. She exhibits no edema.  Neurological: She is alert. No cranial nerve deficit. Coordination normal.  Somewhat drowsy from IV sedation as received. Left upper and lower extremity weakness appears chronic  Skin: Skin is warm. No rash noted. No erythema.  Psychiatric: Affect and judgment normal.      LABORATORY PANEL:   CBC  Recent Labs Lab 01/12/16 0330  WBC 10.0  HGB 9.7*  HCT 30.1*  PLT 161   ------------------------------------------------------------------------------------------------------------------  Chemistries   Recent Labs Lab 01/10/16 1513  NA 138  K 3.5  CL 106  CO2 21*  GLUCOSE 203*  BUN 13  CREATININE 0.86  CALCIUM 9.1  AST 29  ALT 24  ALKPHOS 105  BILITOT 1.4*   ------------------------------------------------------------------------------------------------------------------  Cardiac Enzymes  Recent Labs Lab 01/11/16 1553 01/11/16 2220 01/12/16 0330  TROPONINI 0.42* 0.26* 0.19*    ------------------------------------------------------------------------------------------------------------------  RADIOLOGY:  Ct Head Wo Contrast  01/12/2016  CLINICAL DATA:  History of stroke. Code blue after cardiac cath today. EXAM: CT HEAD WITHOUT CONTRAST TECHNIQUE: Contiguous axial images were obtained from the base of the skull through the vertex without intravenous contrast. COMPARISON:  December 27, 2015 FINDINGS: Paranasal sinuses, mastoid air cells, and bones are within normal limits. Extracranial soft tissues are normal. No subdural, epidural, or subarachnoid hemorrhage. No mass, mass effect or midline shift. Ventricles and sulci are normal. No acute cortical ischemia or infarct. The cerebellum, brainstem, and basal cisterns are normal. No other acute abnormalities. There is an oval region of decreased attenuation in the white matter of the left frontal lobe on series 2, image 19. This was present previously but is more conspicuous today, likely due to interval resolution of a nonacute lacunar infarct. IMPRESSION: No acute intracranial process. Electronically Signed   By: Dorise Bullion III M.D   On: 01/12/2016 15:33   Ct Angio Chest Pe W/cm &/or Wo Cm  01/10/2016  CLINICAL DATA:  Tachycardia and chest pain, abdominal pain EXAM: CT ANGIOGRAPHY CHEST CT ABDOMEN AND PELVIS WITH CONTRAST TECHNIQUE: Multidetector CT imaging of the chest was performed using the standard protocol during bolus administration of intravenous contrast. Multiplanar CT image reconstructions and MIPs were obtained to evaluate the vascular anatomy. Multidetector CT imaging of the abdomen and pelvis was performed using the standard protocol during bolus administration of intravenous contrast. CONTRAST:  100 mL Isovue 370. COMPARISON:  12/17/2015 12/27/2015 FINDINGS: CTA CHEST FINDINGS The lungs are well aerated bilaterally. No focal infiltrate or sizable effusion is seen. The previously noted infiltrates have resolved in the  interval. The thoracic inlet is within normal limits. The thoracic aorta and its branches are unremarkable. No dissection or aneurysm is identified. The pulmonary artery is well visualized and demonstrates a normal branching pattern. No filling defects to suggest pulmonary emboli are identified. No significant hilar or mediastinal adenopathy is seen. The osseous structures show no acute abnormality. CT ABDOMEN and PELVIS FINDINGS The liver is shrunken with significant nodularity consistent with underlying cirrhosis. The gallbladder has been surgically removed. Some very mild perigastric varices are noted extending into the distal esophagus. No significant perisplenic varices are seen. Changes suggestive of prior fundoplication are seen in the proximal stomach. Mild ascites is noted predominately in the right lower quadrant as well as surrounding the liver superiorly. The spleen is mildly enlarged. The adrenal glands and pancreas are within normal limits. The kidneys are well visualized bilaterally with a small staghorn type calculus in the lower pole of the right kidney. No obstructive changes are seen. Aortoiliac calcifications are noted without aneurysmal dilatation. The bladder is well distended. The appendix is within normal limits. No diverticulitis is seen. The osseous structures show no acute abnormality. Review of the MIP images confirms the above findings. IMPRESSION: No evidence of pulmonary emboli. Significant clearing of previously seen bilateral infiltrates. Changes of cirrhosis with portal hypertension, ascites and varices. Stable right renal calculus. Electronically Signed   By: Inez Catalina M.D.   On: 01/10/2016 19:42   Ct Abdomen Pelvis W Contrast  01/10/2016  CLINICAL DATA:  Tachycardia and chest pain, abdominal pain EXAM: CT ANGIOGRAPHY CHEST CT ABDOMEN AND PELVIS WITH CONTRAST TECHNIQUE: Multidetector CT imaging of the chest was performed using the standard protocol during bolus administration  of intravenous contrast. Multiplanar CT  image reconstructions and MIPs were obtained to evaluate the vascular anatomy. Multidetector CT imaging of the abdomen and pelvis was performed using the standard protocol during bolus administration of intravenous contrast. CONTRAST:  100 mL Isovue 370. COMPARISON:  12/17/2015 12/27/2015 FINDINGS: CTA CHEST FINDINGS The lungs are well aerated bilaterally. No focal infiltrate or sizable effusion is seen. The previously noted infiltrates have resolved in the interval. The thoracic inlet is within normal limits. The thoracic aorta and its branches are unremarkable. No dissection or aneurysm is identified. The pulmonary artery is well visualized and demonstrates a normal branching pattern. No filling defects to suggest pulmonary emboli are identified. No significant hilar or mediastinal adenopathy is seen. The osseous structures show no acute abnormality. CT ABDOMEN and PELVIS FINDINGS The liver is shrunken with significant nodularity consistent with underlying cirrhosis. The gallbladder has been surgically removed. Some very mild perigastric varices are noted extending into the distal esophagus. No significant perisplenic varices are seen. Changes suggestive of prior fundoplication are seen in the proximal stomach. Mild ascites is noted predominately in the right lower quadrant as well as surrounding the liver superiorly. The spleen is mildly enlarged. The adrenal glands and pancreas are within normal limits. The kidneys are well visualized bilaterally with a small staghorn type calculus in the lower pole of the right kidney. No obstructive changes are seen. Aortoiliac calcifications are noted without aneurysmal dilatation. The bladder is well distended. The appendix is within normal limits. No diverticulitis is seen. The osseous structures show no acute abnormality. Review of the MIP images confirms the above findings. IMPRESSION: No evidence of pulmonary emboli. Significant  clearing of previously seen bilateral infiltrates. Changes of cirrhosis with portal hypertension, ascites and varices. Stable right renal calculus. Electronically Signed   By: Inez Catalina M.D.   On: 01/10/2016 19:42     ASSESSMENT AND PLAN:   74 female with a history of hypothyroidism and multiple episodes of diverticulitis and colitis, PAF who presented from pulmonary office with chest pain, tachycardia and nausea.   1. Intractable nausea and vomiting: Patient symptoms seem to be improving. This is likely due to gastroparesis. Continue supportive care. CT of the abdomen shows no acute pathology.  2. Atrial fibrillation with RVR: Heart rate seemed to be better controlled. Patient on oral anticoagulation with ELQUis  3. Elevated troponin with chest pain on admission: Troponin slowly increasing which may be due to demand ischemia from tachycardia. Status post cardiac catheter. Patient has irregular coronaries. No acute blockage. Relatively clean Per Dr. Fletcher Anon Continue  aspirin, atorvastatin and metoprolol. Continue to trend troponins. Patient developed an episode of unresponsiveness transiently with elevated heart rate. She is currently stable hemodynamically. CT head repeat negative. -Code was called earlier. Patient responded very quickly without any active intervention. -Patient has chronic left upper land lower extremity weakness which appears functional. Her workup for stroke in April 2017 was negative with negative MRA /MRI brain. She was seen by Dr. Doy Mince neurology. -Neuro consult placed.  4. Hypothyroid: TSH elevated. Increased dose of Synthroid to 100 mg daily. Patient will need repeat TFTs in 4 weeks.  5. Essential hypertension: Blood pressure is controlled. Continue Norvasc and metoprolol.  6. Diabetes: Continue sliding scale insulin, Levemir 17 units. Appreciate diabetes coordinator consult.  Management plans discussed with the patient and she is in agreement.  CODE  STATUS: FULL  TOTAL critical TIME TAKING CARE OF THIS PATIENT: 30 minutes.   Spoke with Dr. Aline Brochure neurology she'll see patient in consultation. Jaskaran Dauzat M.D on  01/12/2016 at 4:02 PM  Between 7am to 6pm - Pager - [phone] After 6pm go to [email or web address] - password [email or web address] [facility]  Office  [phone]  CC: Primary care physician; [place] Pink, MD  Note: This dictation was prepared with Dragon dictation along with smaller phrase technology. Any transcriptional errors that result from this process are unintentional.

## 2016-01-12 NOTE — Progress Notes (Signed)
   Code blue called while patient was in specials recovery s/p cardiac cath. Per staff patient went into narrow complex tachycardia with HR of 153 bpm, became hypoxic with pulse ox in the 70's%, and was unresponsive. Bag valve mask was applied and she responded to sternal rub by RN. She continued to not know where she was, why she was there, or what had happened. She has known residual left-sided weakness from prior CVA. Stat CT head w/o contrast was ordered. Dr. Fletcher Anon, MD arrived and examined the patient who agreed with CT head. He would like for neurology to evaluate her, if indicated. Paged Dr. Posey Pronto, MD and discussed case with her. She will follow up on patient s/p CT head. If she remains lethargic consider transfer to ICU for tonight.

## 2016-01-12 NOTE — Interval H&P Note (Signed)
Cath Lab Visit (complete for each Cath Lab visit)  Clinical Evaluation Leading to the Procedure:   ACS: Yes.    Non-ACS:    Anginal Classification: CCS IV  Anti-ischemic medical therapy: Minimal Therapy (1 class of medications)  Non-Invasive Test Results: No non-invasive testing performed  Prior CABG: No previous CABG      History and Physical Interval Note:  01/12/2016 1:06 PM  [name]  has presented today for surgery, with the diagnosis of NSTEMI  The various methods of treatment have been discussed with the patient and family. After consideration of risks, benefits and other options for treatment, the patient has consented to  Procedure(s): Left Heart Cath and Coronary Angiography (N/A) as a surgical intervention .  The patient's history has been reviewed, patient examined, no change in status, stable for surgery.  I have reviewed the patient's chart and labs.  Questions were answered to the patient's satisfaction.     Kathlyn Sacramento

## 2016-01-12 NOTE — Progress Notes (Signed)
Report called to [facility] in ICU, will transport shortly.

## 2016-01-12 NOTE — H&P (View-Only) (Signed)
    SUBJECTIVE:  She continues to complain of burning sensation in her chest. No evidence of recurrent atrial fibrillation.    Filed Vitals:   01/11/16 2001 01/12/16 0326 01/12/16 0625 01/12/16 1123  BP: 107/67 128/75  142/77  Pulse: 105 82  89  Temp: 98.7 F (37.1 C) 98.8 F (37.1 C)  97.6 F (36.4 C)  TempSrc: Oral Oral    Resp: 18 18  19   Height:      Weight:  127 lb 6.4 oz (57.788 kg) 127 lb 11.2 oz (57.924 kg)   SpO2: 91% 98%  94%    Intake/Output Summary (Last 24 hours) at 01/12/16 1209 Last data filed at 01/12/16 0500  Gross per 24 hour  Intake  974.6 ml  Output   1100 ml  Net -125.4 ml    LABS: Basic Metabolic Panel:  Recent Labs  01/10/16 1513  NA 138  K 3.5  CL 106  CO2 21*  GLUCOSE 203*  BUN 13  CREATININE 0.86  CALCIUM 9.1   Liver Function Tests:  Recent Labs  01/10/16 1513  AST 29  ALT 24  ALKPHOS 105  BILITOT 1.4*  PROT 7.5  ALBUMIN 3.9    Recent Labs  01/10/16 1513  LIPASE 22   CBC:  Recent Labs  01/10/16 1513 01/12/16 0330  WBC 6.9 10.0  HGB 11.3* 9.7*  HCT 35.2 30.1*  MCV 87.4 86.1  PLT 142* 161   Cardiac Enzymes:  Recent Labs  01/11/16 1553 01/11/16 2220 01/12/16 0330  TROPONINI 0.42* 0.26* 0.19*   BNP: Invalid input(s): POCBNP D-Dimer: No results for input(s): DDIMER in the last 72 hours. Hemoglobin A1C:  Recent Labs  01/10/16 2208  HGBA1C 7.7*   Fasting Lipid Panel: No results for input(s): CHOL, HDL, LDLCALC, TRIG, CHOLHDL, LDLDIRECT in the last 72 hours. Thyroid Function Tests:  Recent Labs  01/10/16 2208  TSH 14.097*   Anemia Panel: No results for input(s): VITAMINB12, FOLATE, FERRITIN, TIBC, IRON, RETICCTPCT in the last 72 hours.   PHYSICAL EXAM General: Well developed, well nourished, in no acute distress HEENT:  Normocephalic and atramatic Neck:  No JVD.  Lungs: Clear bilaterally to auscultation and percussion. Heart: HRRR . Normal S1 and S2 without gallops . 1/6 SEM in aortic  area.  Abdomen: Bowel sounds are positive, abdomen soft and non-tender  Msk:  Back normal, normal gait. Normal strength and tone for age. Extremities: No clubbing, cyanosis or edema.   Neuro: Alert and oriented X 3. Psych:  Good affect, responds appropriately  TELEMETRY: Reviewed telemetry pt in NSR  ASSESSMENT AND PLAN:  1. NSTEMI: - Continue to hold Eliquis. She is currently on unfractionated heparin. She is scheduled for cardiac catheterization today. Further recommendations to follow. Continue treatment with metoprolol.  2. Accelerated HTN: -Continue metoprolol 75 mg bid . Blood pressure has improved. Sinus tachycardia on presentation was likely due to beta blocker withdrawal given that she was not able to keep her medications down.  3. Intractable N/V/D: This has been a recurrent problem and leading to multiple hospitalizations. CT scan showed evidence of liver cirrhosis with portal hypertension, ascites and varices. I recommend GI consultation.  4. PAF: - Currently in normal sinus rhythm. Continue metoprolol. Eliquis is currently on hold.    Kathlyn Sacramento, MD, [facility] 01/12/2016 12:09 PM

## 2016-01-12 NOTE — Progress Notes (Signed)
    SUBJECTIVE:  She continues to complain of burning sensation in her chest. No evidence of recurrent atrial fibrillation.    Filed Vitals:   01/11/16 2001 01/12/16 0326 01/12/16 0625 01/12/16 1123  BP: 107/67 128/75  142/77  Pulse: 105 82  89  Temp: 98.7 F (37.1 C) 98.8 F (37.1 C)  97.6 F (36.4 C)  TempSrc: Oral Oral    Resp: 18 18  19   Height:      Weight:  127 lb 6.4 oz (57.788 kg) 127 lb 11.2 oz (57.924 kg)   SpO2: 91% 98%  94%    Intake/Output Summary (Last 24 hours) at 01/12/16 1209 Last data filed at 01/12/16 0500  Gross per 24 hour  Intake  974.6 ml  Output   1100 ml  Net -125.4 ml    LABS: Basic Metabolic Panel:  Recent Labs  01/10/16 1513  NA 138  K 3.5  CL 106  CO2 21*  GLUCOSE 203*  BUN 13  CREATININE 0.86  CALCIUM 9.1   Liver Function Tests:  Recent Labs  01/10/16 1513  AST 29  ALT 24  ALKPHOS 105  BILITOT 1.4*  PROT 7.5  ALBUMIN 3.9    Recent Labs  01/10/16 1513  LIPASE 22   CBC:  Recent Labs  01/10/16 1513 01/12/16 0330  WBC 6.9 10.0  HGB 11.3* 9.7*  HCT 35.2 30.1*  MCV 87.4 86.1  PLT 142* 161   Cardiac Enzymes:  Recent Labs  01/11/16 1553 01/11/16 2220 01/12/16 0330  TROPONINI 0.42* 0.26* 0.19*   BNP: Invalid input(s): POCBNP D-Dimer: No results for input(s): DDIMER in the last 72 hours. Hemoglobin A1C:  Recent Labs  01/10/16 2208  HGBA1C 7.7*   Fasting Lipid Panel: No results for input(s): CHOL, HDL, LDLCALC, TRIG, CHOLHDL, LDLDIRECT in the last 72 hours. Thyroid Function Tests:  Recent Labs  01/10/16 2208  TSH 14.097*   Anemia Panel: No results for input(s): VITAMINB12, FOLATE, FERRITIN, TIBC, IRON, RETICCTPCT in the last 72 hours.   PHYSICAL EXAM General: Well developed, well nourished, in no acute distress HEENT:  Normocephalic and atramatic Neck:  No JVD.  Lungs: Clear bilaterally to auscultation and percussion. Heart: HRRR . Normal S1 and S2 without gallops . 1/6 SEM in aortic  area.  Abdomen: Bowel sounds are positive, abdomen soft and non-tender  Msk:  Back normal, normal gait. Normal strength and tone for age. Extremities: No clubbing, cyanosis or edema.   Neuro: Alert and oriented X 3. Psych:  Good affect, responds appropriately  TELEMETRY: Reviewed telemetry pt in NSR  ASSESSMENT AND PLAN:  1. NSTEMI: - Continue to hold Eliquis. She is currently on unfractionated heparin. She is scheduled for cardiac catheterization today. Further recommendations to follow. Continue treatment with metoprolol.  2. Accelerated HTN: -Continue metoprolol 75 mg bid . Blood pressure has improved. Sinus tachycardia on presentation was likely due to beta blocker withdrawal given that she was not able to keep her medications down.  3. Intractable N/V/D: This has been a recurrent problem and leading to multiple hospitalizations. CT scan showed evidence of liver cirrhosis with portal hypertension, ascites and varices. I recommend GI consultation.  4. PAF: - Currently in normal sinus rhythm. Continue metoprolol. Eliquis is currently on hold.    Kathlyn Sacramento, MD, [facility] 01/12/2016 12:09 PM

## 2016-01-12 NOTE — Progress Notes (Signed)
Initial Nutrition Assessment  DOCUMENTATION CODES:   Non-severe (moderate) malnutrition in context of chronic illness  INTERVENTION:  -Recommend addition of Ensure Enlive po BID, each supplement provides 350 kcal and 20 grams of protein -Provided pt with extensive diet education on last admission (April); provided education on gastroparesis, colitis/diverticulitis and diabetes; provided written and verbal instruction at that time. RD will review on follow if needed or earlier if pt requests  NUTRITION DIAGNOSIS:   Malnutrition related to chronic illness as evidenced by mild depletion of body fat, moderate depletion of body fat.  GOAL:   Patient will meet greater than or equal to 90% of their needs  MONITOR:   PO intake, Supplement acceptance, Labs, Weight trends  REASON FOR ASSESSMENT:   Malnutrition Screening Tool    ASSESSMENT:    60 yo female admitted with poor po intake, nausea and tachycardia; NSTEMI with cardiac cath today,  CT abdomen negative for diverticulitis but consistent with liver cirrhosis with portal HTN, ascites, varices; pt also with hx of gastroparesis   Pt ate 73% of meals on average yesterday, NPO for procedure today. Pt with poor intake prior to this admission; on previous admissions, pt met clinical characteristics for  with moderate malnutrition in context of chronic illness  Diet Order:  Diet NPO time specified Except for: Sips with Meds  Skin:  Reviewed, no issues  Last BM:  01/11/16   Nutrition-Focused physical exam completed. Findings are mild fat depletion, moderate muscle depletion, and mild edema.   Labs:   Glucose Profile:   Recent Labs  01/11/16 2056 01/12/16 0717 01/12/16 1120  GLUCAP 199* 81 84   Meds: reglan, levemir, ss novolog, NS at 100 ml/hr, zofran Height:   Ht Readings from Last 1 Encounters:  01/10/16 _0  (1.6 m)    Weight: pt continues to lose weight, 6.6% wt loss since admission in April  Wt Readings from Last 1  Encounters:  01/12/16 127 lb 11.2 oz (57.924 kg)    Wt Readings from Last 10 Encounters:  01/12/16 127 lb 11.2 oz (57.924 kg)  01/10/16 131 lb (59.421 kg)  12/28/15 148 lb 6.4 oz (67.314 kg)  12/17/15 136 lb 14.4 oz (62.097 kg)  11/16/15 136 lb 4.8 oz (61.825 kg)  10/21/15 133 lb (60.328 kg)  10/15/15 131 lb (59.421 kg)  10/08/15 136 lb 9.6 oz (61.961 kg)  08/16/15 142 lb 8 oz (64.638 kg)  08/06/15 151 lb (68.493 kg)     BMI:  Body mass index is 22.63 kg/(m^2).  Estimated Nutritional Needs:   Kcal:  6751-9824 kcals   Protein:  64-81 g   Fluid:  >1.7 L per day  EDUCATION NEEDS:   Education needs addressed  Kerman Passey [place], [place], LDN [phone] Pager  [phone] Weekend/On-Call Pager

## 2016-01-12 NOTE — Progress Notes (Signed)
Patient resting quietly in bed, became apenic, oxygen saturation dropped as low as 73%, and was non-responsive to sternal rub. Patient was bagged and oxygen saturation improved to WNL, code blue called. Upon arrival of code team patient opened eyes and began breathing independently, oriented only to self. Transported to CT scan with patient breathing indpendently on 2L n/c, tolerated well and back to recovery area. Patient continued to be oriented only to self, lethargic, with left sided weakness, MDs aware.

## 2016-01-12 NOTE — Progress Notes (Addendum)
ANTICOAGULATION CONSULT NOTE - Initial Consult  Pharmacy Consult for Heparin Drip Indication: chest pain/ACS  Allergies  Allergen Reactions  . Hydrocodone Other (See Comments)    Pt states that this medication caused cirrhosis of the liver.    . Erythromycin Other (See Comments)    Reaction:  Fever   . Prednisone Other (See Comments)    Reaction:  Unknown   . Rosiglitazone Maleate Swelling  . Codeine Sulfate Rash  . Tetanus-Diphtheria Toxoids Td Rash and Other (See Comments)    Reaction:  Fever     Patient Measurements: Height: 5' 3"  (160 cm) Weight: 130 lb 11.2 oz (59.285 kg) IBW/kg (Calculated) : 52.4 Heparin Dosing Weight: 59.3 kg  Vital Signs: Temp: 98.8 F (37.1 C) (05/04 0326) Temp Source: Oral (05/04 0326) BP: 128/75 mmHg (05/04 0326) Pulse Rate: 82 (05/04 0326)  Labs:  Recent Labs  01/10/16 1513  01/11/16 1553 01/11/16 2220 01/12/16 0330  HGB 11.3*  --   --   --  9.7*  HCT 35.2  --   --   --  30.1*  PLT 142*  --   --   --  161  APTT  --   --  36  --   --   LABPROT  --   --  20.1*  --   --   INR  --   --  1.71  --   --   HEPARINUNFRC  --   --   --  1.52*  --   CREATININE 0.86  --   --   --   --   TROPONINI 0.03  < > 0.42* 0.26* 0.19*  < > = values in this interval not displayed.  Estimated Creatinine Clearance: 56.1 mL/min (by C-G formula based on Cr of 0.86).   Medical History: Past Medical History  Diagnosis Date  . Type 1 diabetes ([place])   . Hypothyroid   . Degenerative disk disease   . Stomach ulcer   . Diverticulitis   . Syncope 01/2015  . Anxiety   . GERD (gastroesophageal reflux disease)   . History of hiatal hernia   . Cancer ([facility])     HX OF CANCER OF UTERUS   . TIA (transient ischemic attack) 02/2015  . Hypertension   . Pancreatitis   . PAF (paroxysmal atrial fibrillation) ([place]) 03/2015    a. new onset 03/2015 in setting of intractable N/V; b. on Eliquis 5 mg bid; c. CHADSVASc 4 (DM, TIA x 2, female)  . Intussusception intestine  ([place]) 05/2015  . Stroke ([place])   . Allergy   . Cirrhosis of liver not due to alcohol ([place]) 2016    Medications:  Scheduled:  . amLODipine  5 mg Oral Daily  . [START ON 01/13/2016] aspirin  81 mg Oral Pre-Cath  . atorvastatin  40 mg Oral QHS  . citalopram  20 mg Oral Daily  . dicyclomine  10 mg Oral TID AC & HS  . docusate sodium  100 mg Oral BID  . insulin aspart  0-5 Units Subcutaneous QHS  . insulin aspart  0-9 Units Subcutaneous TID WC  . insulin detemir  17 Units Subcutaneous QHS  . levothyroxine  100 mcg Oral QAC breakfast  . magnesium oxide  400 mg Oral BID  . metoCLOPramide  5 mg Oral TID AC & HS  . metoprolol tartrate  75 mg Oral BID  . mometasone-formoterol  2 puff Inhalation BID  . morphine  15 mg Oral Q12H  .  nitroGLYCERIN  1 inch Topical Q6H  . pantoprazole  40 mg Oral BID AC  . rifaximin  550 mg Oral BID  . sodium chloride flush  3 mL Intravenous Q12H  . sodium chloride flush  3 mL Intravenous Q12H   Infusions:  . sodium chloride 100 mL/hr at 01/12/16 0337  . [START ON 01/13/2016] sodium chloride     Followed by  . [START ON 01/13/2016] sodium chloride    . heparin      Assessment: Pharmacy consulted to start heparin drip and titrate in a 60 yo female for STEMI/ACS. Patient with afib and takes Eliquis 5 mg po BID.  Patient received a dose of Eliquis at 2214 on 5/2.  Lovenox 60 mg subq q12h was then ordered and patient received a dose of Lovenox 60 mg subq at 0613 today.    Baseline labs ordered: INR, aPTT   Goal of Therapy:  Heparin level 0.3-0.7 units/ml Monitor platelets by anticoagulation protocol: Yes   Plan:  As patient received Lovenox 60 mg subq once today at 0613, will initiate heparin drip about 10-11 hours after dose at 1630 today.  Rate calculated at 12 units/kg/hr for ACS to be 700 units/hr.  Will order HL 6 hours after start of drip at 2230 today.   5/3 22:30 heparin level 1.52, lab crossed interface late.. Hold drip x 1 hour and restart at 500  units/hr. Heparin level 6 hours after restart.   Pharmacy will continue to follow.   Tani Virgo S 01/12/2016,5:38 AM

## 2016-01-13 ENCOUNTER — Telehealth: Payer: Self-pay | Admitting: Nurse Practitioner

## 2016-01-13 DIAGNOSIS — M6289 Other specified disorders of muscle: Secondary | ICD-10-CM

## 2016-01-13 LAB — CBC
HCT: 32.6 % — ABNORMAL LOW (ref 35.0–47.0)
Hemoglobin: 10.7 g/dL — ABNORMAL LOW (ref 12.0–16.0)
MCH: 27.8 pg (ref 26.0–34.0)
MCHC: 32.8 g/dL (ref 32.0–36.0)
MCV: 84.8 fL (ref 80.0–100.0)
PLATELETS: 141 10*3/uL — AB (ref 150–440)
RBC: 3.85 MIL/uL (ref 3.80–5.20)
RDW: 16.5 % — AB (ref 11.5–14.5)
WBC: 8.5 10*3/uL (ref 3.6–11.0)

## 2016-01-13 LAB — GLUCOSE, CAPILLARY
Glucose-Capillary: 157 mg/dL — ABNORMAL HIGH (ref 65–99)
Glucose-Capillary: 208 mg/dL — ABNORMAL HIGH (ref 65–99)

## 2016-01-13 MED ORDER — DEXTROSE 50 % IV SOLN
INTRAVENOUS | Status: AC
  Filled 2016-01-13: qty 50

## 2016-01-13 NOTE — Progress Notes (Signed)
Pt transferred to 2A. On transport monitor. Pt placed on telemetry verified with central monitoring. PT states she will notify her family of her new room.

## 2016-01-13 NOTE — Plan of Care (Signed)
Problem: Health Behavior/Discharge Planning: Goal: Ability to manage health-related needs will improve Outcome: Completed/Met Date Met:  01/13/16 Pt alert and oriented, making health care decisions  Problem: Pain Managment: Goal: General experience of comfort will improve Outcome: Progressing Tolerating po meds  Problem: Activity: Goal: Risk for activity intolerance will decrease Outcome: Progressing Up to chair and ambulating in hall this morning  Problem: Fluid Volume: Goal: Ability to maintain a balanced intake and output will improve Outcome: Completed/Met Date Met:  01/13/16 IV looped, tolerating po fluids without diff. uop adequate  Problem: Nutrition: Goal: Adequate nutrition will be maintained Outcome: Completed/Met Date Met:  01/13/16 Tolerating diet without n/v  Problem: Consults Goal: Cardiac Cath Patient Education (See Patient Education module for education specifics.)  Outcome: Adequate for Discharge Cath one yesterday, education complete  Problem: Phase I Progression Outcomes Goal: Initial discharge plan identified Outcome: Completed/Met Date Met:  01/13/16 home

## 2016-01-13 NOTE — Care Management (Signed)
Notified [facility] of discharge and resumption of care

## 2016-01-13 NOTE — Progress Notes (Signed)
Patient discharged via wheelchair and private vehicle. IV removed and catheter intact. All discharge instructions given and patient verbalizes understanding. Tele removed and returned. No prescriptions given to patient No distress noted.

## 2016-01-13 NOTE — Progress Notes (Signed)
[place] [facility] [place] NAME: [name]    MR#:  [id number]  DATE OF BIRTH:  1956-01-26  SUBJECTIVE:  No events reported overnight. Patient remained hemodynamically stable. Complains of chronic chest pain. Vitals stable.  REVIEW OF SYSTEMS:    Review of Systems  Constitutional: Negative for fever, chills and malaise/fatigue.  HENT: Negative for ear discharge, ear pain, hearing loss, nosebleeds and sore throat.   Eyes: Negative for blurred vision and pain.  Respiratory: Negative for cough, hemoptysis, shortness of breath and wheezing.   Cardiovascular: Negative for chest pain, palpitations and leg swelling.  Gastrointestinal: Positive for nausea, abdominal pain and diarrhea. Negative for vomiting and blood in stool.  Genitourinary: Negative for dysuria.  Musculoskeletal: Negative for back pain.  Neurological: Positive for focal weakness and weakness. Negative for dizziness, tremors, speech change, seizures and headaches.       Chronic left upper and lower extremity weakness.  Endo/Heme/Allergies: Does not bruise/bleed easily.  Psychiatric/Behavioral: Negative for depression, suicidal ideas and hallucinations.   DRUG ALLERGIES:   Allergies  Allergen Reactions  . Hydrocodone Other (See Comments)    Pt states that this medication caused cirrhosis of the liver.    . Erythromycin Other (See Comments)    Reaction:  Fever   . Prednisone Other (See Comments)    Reaction:  Unknown   . Rosiglitazone Maleate Swelling  . Codeine Sulfate Rash  . Tetanus-Diphtheria Toxoids Td Rash and Other (See Comments)    Reaction:  Fever     VITALS:  Blood pressure 142/73, pulse 74, temperature 97.9 F (36.6 C), temperature source Axillary, resp. rate 11, height 5' 3"  (1.6 m), weight 62.1 kg (136 lb 14.5 oz), SpO2 95 %.  PHYSICAL EXAMINATION:   Physical Exam  Constitutional: She is oriented to person, place, and time and well-developed, well-nourished,  and in no distress. No distress.  HENT:  Head: Normocephalic and atraumatic.  Eyes: Conjunctivae are normal. Pupils are equal, round, and reactive to light. No scleral icterus.  Neck: Normal range of motion. Neck supple. No JVD present. No tracheal deviation present.  Cardiovascular: Normal rate, regular rhythm and normal heart sounds.  Exam reveals no gallop and no friction rub.   No murmur heard. Pulmonary/Chest: Effort normal and breath sounds normal. No respiratory distress. She has no wheezes. She has no rales. She exhibits no tenderness.  Abdominal: Soft. Bowel sounds are normal. She exhibits no distension and no mass. There is no tenderness. There is no rebound and no guarding.  Musculoskeletal: Normal range of motion. She exhibits no edema.  Neurological: She is alert and oriented to person, place, and time. No cranial nerve deficit. Coordination normal.  Left upper and lower extremity weakness appears chronic  Skin: Skin is warm. No rash noted. No erythema.  Psychiatric: Affect and judgment normal.    LABORATORY PANEL:   CBC  Recent Labs Lab 01/13/16 0416  WBC 8.5  HGB 10.7*  HCT 32.6*  PLT 141*   ------------------------------------------------------------------------------------------------------------------  Chemistries   Recent Labs Lab 01/10/16 1513  NA 138  K 3.5  CL 106  CO2 21*  GLUCOSE 203*  BUN 13  CREATININE 0.86  CALCIUM 9.1  AST 29  ALT 24  ALKPHOS 105  BILITOT 1.4*   ------------------------------------------------------------------------------------------------------------------  Cardiac Enzymes  Recent Labs Lab 01/11/16 1553 01/11/16 2220 01/12/16 0330  TROPONINI 0.42* 0.26* 0.19*   ------------------------------------------------------------------------------------------------------------------  RADIOLOGY:  Ct Head Wo Contrast  01/12/2016  CLINICAL DATA:  History  of stroke. Code blue after cardiac cath today. EXAM: CT HEAD  WITHOUT CONTRAST TECHNIQUE: Contiguous axial images were obtained from the base of the skull through the vertex without intravenous contrast. COMPARISON:  December 27, 2015 FINDINGS: Paranasal sinuses, mastoid air cells, and bones are within normal limits. Extracranial soft tissues are normal. No subdural, epidural, or subarachnoid hemorrhage. No mass, mass effect or midline shift. Ventricles and sulci are normal. No acute cortical ischemia or infarct. The cerebellum, brainstem, and basal cisterns are normal. No other acute abnormalities. There is an oval region of decreased attenuation in the white matter of the left frontal lobe on series 2, image 19. This was present previously but is more conspicuous today, likely due to interval resolution of a nonacute lacunar infarct. IMPRESSION: No acute intracranial process. Electronically Signed   By: Dorise Bullion III M.D   On: 01/12/2016 15:33     ASSESSMENT AND PLAN:   60 female with a history of hypothyroidism and multiple episodes of diverticulitis and colitis, PAF who presented from pulmonary office with chest pain, tachycardia and nausea.   1. Intractable nausea and vomiting: Patient symptoms seem to be improving. This is likely due to gastroparesis. Continue supportive care. CT of the abdomen shows no acute pathology.  2. Atrial fibrillation with RVR: Heart rate seemed to be better controlled. Patient on oral anticoagulation with ELQUis  3. Elevated troponin with chest pain on admission: Troponin slowly increasing which may be due to demand ischemia from tachycardia. Status post cardiac cath. Patient has irregular coronaries. No acute blockage. Relatively clean Per Dr. Fletcher Anon Continue  aspirin, atorvastatin and metoprolol. Patient developed an episode of unresponsiveness transiently with elevated heart rate. She is currently stable hemodynamically. CT head repeat negative. -Code was called earlier. Patient responded very quickly without any active  intervention. -Patient has chronic left upper land lower extremity weakness which appears functional. Her workup for stroke in April 2017 was negative with negative MRA /MRI brain. She was seen by Dr. Doy Mince neurology. -Neuro consult placed. -PT to see  4. Hypothyroid: TSH elevated. Increased dose of Synthroid to 100 mg daily. Patient will need repeat TFTs in 4 weeks.  5. Essential hypertension: Blood pressure is controlled. Continue Norvasc and metoprolol.  6. Diabetes: Continue sliding scale insulin, Levemir 17 units. Appreciate diabetes coordinator consult.  7. Known history of cirrhosis, appears NASF -Patient follows up with GI as outpatient  Transfer to telemetry.care Management for discharge planning. Discharge tomorrow if remains stable. Patient agreeable.  Management plans discussed with the patient and she is in agreement.  CODE STATUS: FULL  TOTAL TIME TAKING CARE OF THIS PATIENT: 30 minutes.   Kennedy Bohanon M.D on 01/13/2016 at 7:01 AM  Between 7am to 6pm - Pager - [phone] After 6pm go to [email or web address] - password [email or web address] [facility]  Office  [phone]  CC: Primary care physician; [place] Pink, MD  Note: This dictation was prepared with Dragon dictation along with smaller phrase technology. Any transcriptional errors that result from this process are unintentional.

## 2016-01-13 NOTE — Discharge Summary (Signed)
[facility] NAME: [name]    MR#:  [id number]  DATE OF BIRTH:  1956-02-02  DATE OF ADMISSION:  01/10/2016 ADMITTING PHYSICIAN: Harrie Foreman, MD  DATE OF DISCHARGE: 01/13/16  PRIMARY CARE PHYSICIAN: Maryland Pink, MD    ADMISSION DIAGNOSIS:  Generalized abdominal pain [R10.84] Intractable vomiting with nausea, vomiting of unspecified type [R11.10]  DISCHARGE DIAGNOSIS:  Chest pain-cardiac catheterization did not show significant CAD Diabetes Chronic left upper and lower extremity weakness with negative workup. Neuro evaluation completed Chronic pain Paroxysmal A. fib on eliquis SECONDARY DIAGNOSIS:   Past Medical History  Diagnosis Date  . Type 1 diabetes ([place])   . Hypothyroid   . Degenerative disk disease   . Stomach ulcer   . Diverticulitis   . Syncope 01/2015  . Anxiety   . GERD (gastroesophageal reflux disease)   . History of hiatal hernia   . Cancer (H[facility])     HX OF CANCER OF UTERUS   . TIA (transient ischemic attack) 02/2015  . Hypertension   . Pancreatitis   . PAF (paroxysmal atrial fibrillation) ([place]) 03/2015    a. new onset 03/2015 in setting of intractable N/V; b. on Eliquis 5 mg bid; c. CHADSVASc 4 (DM, TIA x 2, female)  . Intussusception intestine ([place]) 05/2015  . Stroke ([place])   . Allergy   . Cirrhosis of liver not due to alcohol [facility]) 2016    HOSPITAL COURSE:  44 female with a history of hypothyroidism and multiple episodes of diverticulitis and colitis, PAF who presented from pulmonary office with chest pain, tachycardia and nausea.   1. Intractable nausea and vomiting: Patient symptoms seem to be improving. This is likely due to gastroparesis. Continue supportive care. CT of the abdomen shows no acute pathology.  2. Atrial fibrillation with RVR: Heart rate seemed to be better controlled. Patient on oral anticoagulation with ELQUis  3. Elevated troponin with chest pain on  admission: Troponin slowly increasing which may be due to demand ischemia from tachycardia. Status post cardiac cath. Patient has irregular coronaries. No acute blockage. Relatively clean Per Dr. Fletcher Anon Continue aspirin, atorvastatin and metoprolol. Patient developed an episode of unresponsiveness transiently with elevated heart rate. She is currently stable hemodynamically. CT head repeat negative. -Patient has chronic left upper land lower extremity weakness which appears functional. Her workup for stroke in April 2017 was negative with negative MRA /MRI brain. She was seen by Dr. Doy Mince neurology. -Neuro consult noted. Dr. Doy Mince recommends outpatient EEG. No further neuro recommendations. No evidence of seizures or any involuntary movements noted. -PT eval noted. No PT needs at home   4. Hypothyroid: TSH elevated. Increased dose of Synthroid to 100 mg daily. Patient will need repeat TFTs in 4 weeks.  5. Essential hypertension: Blood pressure is controlled. Continue Norvasc and metoprolol.  6. Diabetes: Continue sliding scale insulin, Levemir 17 units. Appreciate diabetes coordinator consult.  7. Known history of cirrhosis, appears NASF -Patient follows up with GI as outpatient Overall patient stable. She is medically stable for discharge. Patient is agreeable. CONSULTS OBTAINED:  Treatment Team:  Catarina Hartshorn, MD Alexis Goodell, MD  DRUG ALLERGIES:   Allergies  Allergen Reactions  . Hydrocodone Other (See Comments)    Pt states that this medication caused cirrhosis of the liver.    . Erythromycin Other (See Comments)    Reaction:  Fever   . Prednisone Other (See Comments)    Reaction:  Unknown   .  Rosiglitazone Maleate Swelling  . Codeine Sulfate Rash  . Tetanus-Diphtheria Toxoids Td Rash and Other (See Comments)    Reaction:  Fever     DISCHARGE MEDICATIONS:   Current Discharge Medication List    CONTINUE these medications which have NOT CHANGED   Details   albuterol (PROVENTIL HFA;VENTOLIN HFA) 108 (90 Base) MCG/ACT inhaler Inhale 2 puffs into the lungs every 6 (six) hours as needed for wheezing or shortness of breath. Qty: 1 Inhaler, Refills: 2    amLODipine (NORVASC) 5 MG tablet Take 5 mg by mouth daily.    apixaban (ELIQUIS) 5 MG TABS tablet Take 1 tablet (5 mg total) by mouth 2 (two) times daily. Qty: 60 tablet, Refills: 0    atorvastatin (LIPITOR) 40 MG tablet Take 40 mg by mouth at bedtime.    benzonatate (TESSALON) 200 MG capsule Take 1 capsule (200 mg total) by mouth 3 (three) times daily as needed for cough. Qty: 20 capsule, Refills: 0    budesonide-formoterol (SYMBICORT) 160-4.5 MCG/ACT inhaler Inhale 2 puffs into the lungs 2 (two) times daily. Qty: 1 Inhaler, Refills: 12    citalopram (CELEXA) 20 MG tablet Take 20 mg by mouth daily.    dicyclomine (BENTYL) 10 MG capsule Take 10 mg by mouth 4 (four) times daily -  before meals and at bedtime.     insulin aspart (NOVOLOG) 100 UNIT/ML injection Inject into the skin 3 (three) times daily with meals as needed for high blood sugar. Pt uses as needed per sliding scale.    insulin detemir (LEVEMIR) 100 UNIT/ML injection Inject 17 Units into the skin at bedtime.    levothyroxine (SYNTHROID, LEVOTHROID) 75 MCG tablet Take 1 tablet (75 mcg total) by mouth daily before breakfast. Qty: 30 tablet, Refills: 1    magnesium oxide (MAG-OX) 400 (241.3 Mg) MG tablet Take 1 tablet (400 mg total) by mouth 2 (two) times daily. Qty: 10 tablet, Refills: 0    metoCLOPramide (REGLAN) 5 MG tablet Take 1 tablet (5 mg total) by mouth 4 (four) times daily -  before meals and at bedtime. Qty: 90 tablet, Refills: 0    metoprolol tartrate (LOPRESSOR) 25 MG tablet Take 25 mg by mouth 2 (two) times daily.     morphine (MS CONTIN) 15 MG 12 hr tablet Take 1 tablet (15 mg total) by mouth every 8 (eight) hours. Qty: 75 tablet, Refills: 0    oxyCODONE (OXY IR/ROXICODONE) 5 MG immediate release tablet Take  5-10 mg by mouth every 6 (six) hours as needed for severe pain.    pantoprazole (PROTONIX) 40 MG tablet Take 40 mg by mouth 2 (two) times daily before a meal.    rifaximin (XIFAXAN) 550 MG TABS tablet Take 1 tablet (550 mg total) by mouth 2 (two) times daily. Qty: 60 tablet, Refills: 6    tiZANidine (ZANAFLEX) 4 MG tablet Take 4 mg by mouth 3 (three) times daily as needed for muscle spasms.         If you experience worsening of your admission symptoms, develop shortness of breath, life threatening emergency, suicidal or homicidal thoughts you must seek medical attention immediately by calling 911 or calling your MD immediately  if symptoms less severe.  You Must read complete instructions/literature along with all the possible adverse reactions/side effects for all the Medicines you take and that have been prescribed to you. Take any new Medicines after you have completely understood and accept all the possible adverse reactions/side effects.   Please note  You  were cared for by a hospitalist during your hospital stay. If you have any questions about your discharge medications or the care you received while you were in the hospital after you are discharged, you can call the unit and asked to speak with the hospitalist on call if the hospitalist that took care of you is not available. Once you are discharged, your primary care physician will handle any further medical issues. Please note that NO REFILLS for any discharge medications will be authorized once you are discharged, as it is imperative that you return to your primary care physician (or establish a relationship with a primary care physician if you do not have one) for your aftercare needs so that they can reassess your need for medications and monitor your lab values.  DATA REVIEW:   CBC   Recent Labs Lab 01/13/16 0416  WBC 8.5  HGB 10.7*  HCT 32.6*  PLT 141*    Chemistries   Recent Labs Lab 01/10/16 1513  NA 138  K  3.5  CL 106  CO2 21*  GLUCOSE 203*  BUN 13  CREATININE 0.86  CALCIUM 9.1  AST 29  ALT 24  ALKPHOS 105  BILITOT 1.4*    Microbiology Results   Recent Results (from the past 240 hour(s))  Blood culture (routine x 2)     Status: None (Preliminary result)   Collection Time: 01/10/16  5:10 PM  Result Value Ref Range Status   Specimen Description BLOOD LEFT HAND  Final   Special Requests BOTTLES DRAWN AEROBIC AND ANAEROBIC  5CC  Final   Culture NO GROWTH 3 DAYS  Final   Report Status PENDING  Incomplete  Blood culture (routine x 2)     Status: None (Preliminary result)   Collection Time: 01/10/16  5:22 PM  Result Value Ref Range Status   Specimen Description BLOOD RIGHT HAND  Final   Special Requests BOTTLES DRAWN AEROBIC AND ANAEROBIC  1CC  Final   Culture NO GROWTH 3 DAYS  Final   Report Status PENDING  Incomplete  MRSA PCR Screening     Status: None   Collection Time: 01/12/16  5:41 PM  Result Value Ref Range Status   MRSA by PCR NEGATIVE NEGATIVE Final    Comment:        The GeneXpert MRSA Assay (FDA approved for NASAL specimens only), is one component of a comprehensive MRSA colonization surveillance program. It is not intended to diagnose MRSA infection nor to guide or monitor treatment for MRSA infections.     RADIOLOGY:  Ct Head Wo Contrast  01/12/2016  CLINICAL DATA:  History of stroke. Code blue after cardiac cath today. EXAM: CT HEAD WITHOUT CONTRAST TECHNIQUE: Contiguous axial images were obtained from the base of the skull through the vertex without intravenous contrast. COMPARISON:  December 27, 2015 FINDINGS: Paranasal sinuses, mastoid air cells, and bones are within normal limits. Extracranial soft tissues are normal. No subdural, epidural, or subarachnoid hemorrhage. No mass, mass effect or midline shift. Ventricles and sulci are normal. No acute cortical ischemia or infarct. The cerebellum, brainstem, and basal cisterns are normal. No other acute  abnormalities. There is an oval region of decreased attenuation in the white matter of the left frontal lobe on series 2, image 19. This was present previously but is more conspicuous today, likely due to interval resolution of a nonacute lacunar infarct. IMPRESSION: No acute intracranial process. Electronically Signed   By: Dorise Bullion III M.D   On: 01/12/2016  15:33     Management plans discussed with the patient, family and they are in agreement.  CODE STATUS:     Code Status Orders        Start     Ordered   01/10/16 2201  Do not attempt resuscitation (DNR)   Continuous    Question Answer Comment  In the event of cardiac or respiratory ARREST Do not call a "code blue"   In the event of cardiac or respiratory ARREST Do not perform Intubation, CPR, defibrillation or ACLS   In the event of cardiac or respiratory ARREST Use medication by any route, position, wound care, and other measures to relive pain and suffering. May use oxygen, suction and manual treatment of airway obstruction as needed for comfort.      01/10/16 2200    Code Status History    Date Active Date Inactive Code Status Order ID Comments User Context   12/27/2015 11:02 PM 01/01/2016  6:17 PM DNR [id number]  Max Sane, MD Inpatient   12/16/2015  4:54 PM 12/19/2015  4:52 PM DNR [id number]  Loletha Grayer, MD ED   11/11/2015 11:36 AM 11/16/2015  2:05 PM Full Code [id number]  Samella Parr, NP Inpatient   10/15/2015  1:23 AM 10/16/2015  6:17 PM Full Code [id number]  Lance Coon, MD Inpatient   10/04/2015  8:19 PM 10/08/2015  6:34 PM Full Code [id number]  Bethena Roys, MD Inpatient   08/15/2015  1:05 AM 08/16/2015  8:04 PM Full Code [id number]  Harrie Foreman, MD Inpatient   08/01/2015  1:25 AM 08/06/2015  5:41 PM Full Code [id number]  Harrie Foreman, MD Inpatient   07/04/2015  2:53 AM 07/11/2015  6:33 PM Full Code [id number]  Lytle Butte, MD ED   05/27/2015 10:48 PM 05/31/2015  2:37 PM Full Code [id number]  Rise Patience, MD Inpatient   05/19/2015  1:14 AM 05/22/2015  4:28 PM Full Code [id number]  Rise Patience, MD Inpatient   04/05/2015  9:16 AM 04/07/2015  5:23 PM Full Code [id number]  Bettey Costa, MD Inpatient   04/03/2015 12:31 PM 04/05/2015  9:16 AM Full Code [id number]  Bettey Costa, MD Inpatient   03/11/2015  4:49 PM 03/14/2015  3:50 PM Full Code [id number]  Dustin Flock, MD Inpatient   02/15/2015  4:51 PM 02/17/2015  2:33 PM DNR [id number]  Dustin Flock, MD Inpatient   02/15/2015  4:35 PM 02/15/2015  4:51 PM Full Code [id number]  Dustin Flock, MD Inpatient   01/11/2015  2:40 AM 01/12/2015  6:11 PM Full Code [id number]  Etta Quill, DO ED      TOTAL TIME TAKING CARE OF THIS PATIENT: 40 minutes.    Laurance Heide M.D on 01/13/2016 at 1:20 PM  Between 7am to 6pm - Pager - [phone] After 6pm go to [email or web address] - password [email or web address] [facility]  [place] [facility]  Office  [phone]  CC: Primary care physician; Maryland Pink, MD

## 2016-01-13 NOTE — Progress Notes (Signed)
Chaplain rounded the unit and provided a compassionate presence and support to the patient. [name] [phone]

## 2016-01-13 NOTE — Consult Note (Signed)
Reason for Consult:Left sided weakness Referring Physician: Posey Pronto  CC: Left sided weakness  HPI: [name] is an 60 y.o. female who underwent cardiac cath on the PM of 5/4 that showed minimal luminal irregularities without evidence of obstructive CAD. She had mildly to moderately reduced LV systolic function with an EF of 40% with WM abnormality suggestive of stress-induced cardiomyopathy. Mildly elevated LVEDP. After her cardiac cath the patient had a narrow complex tachycardia with HR of 153 bpm coupled with brief episode of respiratory failure. Code blue was called. She responded to sternal rub by RN. She was breathing independently and had stable vital signs. Upon coming to she was confused and had left sided weakness (though this was present at baseline from prior CVA). Thus she underwent stat CT head w/o contrast which was negative for acute intracranial process. She was transferred to the ICU overnight for monitoring, had no issues, and rested well. Much more alert this morning.  Patient with left sided weakness since 4/18.  Evaluation at that time including MRI of the brain was unremarkable.  Patient remains on Eliquis.  Past Medical History  Diagnosis Date  . Type 1 diabetes ([place])   . Hypothyroid   . Degenerative disk disease   . Stomach ulcer   . Diverticulitis   . Syncope 01/2015  . Anxiety   . GERD (gastroesophageal reflux disease)   . History of hiatal hernia   . Cancer (H[facility])     HX OF CANCER OF UTERUS   . TIA (transient ischemic attack) 02/2015  . Hypertension   . Pancreatitis   . PAF (paroxysmal atrial fibrillation) ([place]) 03/2015    a. new onset 03/2015 in setting of intractable N/V; b. on Eliquis 5 mg bid; c. CHADSVASc 4 (DM, TIA x 2, female)  . Intussusception intestine ([place]) 05/2015  . Stroke ([place])   . Allergy   . Cirrhosis of liver not due to alcohol ([place]) 12-Nov-2014    Past Surgical History  Procedure Laterality Date  . Hernia repair    . Abdominal hysterectomy     . Cholecystectomy    . Esophagogastroduodenoscopy N/A 04/04/2015    Procedure: ESOPHAGOGASTRODUODENOSCOPY (EGD);  Surgeon: Hulen Luster, MD;  Location: [facility] ENDOSCOPY;  Service: Endoscopy;  Laterality: N/A;  . Cardiac catheterization N/A 01/12/2016    Procedure: Left Heart Cath and Coronary Angiography;  Surgeon: Wellington Hampshire, MD;  Location: [facility];  Service: Cardiovascular;  Laterality: N/A;    Family History  Problem Relation Age of Onset  . Hypertension Mother   . CAD Sister   . Heart attack Sister     Deceased 11-12-14  . CAD Brother     Social History:  reports that she has never smoked. She has never used smokeless tobacco. She reports that she does not drink alcohol or use illicit drugs.  Allergies  Allergen Reactions  . Hydrocodone Other (See Comments)    Pt states that this medication caused cirrhosis of the liver.    . Erythromycin Other (See Comments)    Reaction:  Fever   . Prednisone Other (See Comments)    Reaction:  Unknown   . Rosiglitazone Maleate Swelling  . Codeine Sulfate Rash  . Tetanus-Diphtheria Toxoids Td Rash and Other (See Comments)    Reaction:  Fever     Medications:  I have reviewed the patient's current medications. Prior to Admission:  Prescriptions prior to admission  Medication Sig Dispense Refill Last Dose  . albuterol (PROVENTIL HFA;VENTOLIN  HFA) 108 (90 Base) MCG/ACT inhaler Inhale 2 puffs into the lungs every 6 (six) hours as needed for wheezing or shortness of breath. 1 Inhaler 2 01/09/2016 at Unknown time  . amLODipine (NORVASC) 5 MG tablet Take 5 mg by mouth daily.   01/09/2016 at Unknown time  . apixaban (ELIQUIS) 5 MG TABS tablet Take 1 tablet (5 mg total) by mouth 2 (two) times daily. 60 tablet 0 01/09/2016 at 2130  . atorvastatin (LIPITOR) 40 MG tablet Take 40 mg by mouth at bedtime.   01/09/2016 at Unknown time  . benzonatate (TESSALON) 200 MG capsule Take 1 capsule (200 mg total) by mouth 3 (three) times daily as needed for  cough. 20 capsule 0 01/09/2016 at Unknown time  . budesonide-formoterol (SYMBICORT) 160-4.5 MCG/ACT inhaler Inhale 2 puffs into the lungs 2 (two) times daily. 1 Inhaler 12 01/09/2016 at Unknown time  . citalopram (CELEXA) 20 MG tablet Take 20 mg by mouth daily.   01/09/2016 at Unknown time  . dicyclomine (BENTYL) 10 MG capsule Take 10 mg by mouth 4 (four) times daily -  before meals and at bedtime.    01/09/2016 at Unknown time  . insulin aspart (NOVOLOG) 100 UNIT/ML injection Inject into the skin 3 (three) times daily with meals as needed for high blood sugar. Pt uses as needed per sliding scale.   01/09/2016 at Unknown time  . insulin detemir (LEVEMIR) 100 UNIT/ML injection Inject 17 Units into the skin at bedtime.   01/09/2016 at Unknown time  . levothyroxine (SYNTHROID, LEVOTHROID) 75 MCG tablet Take 1 tablet (75 mcg total) by mouth daily before breakfast. 30 tablet 1 01/09/2016 at Unknown time  . magnesium oxide (MAG-OX) 400 (241.3 Mg) MG tablet Take 1 tablet (400 mg total) by mouth 2 (two) times daily. 10 tablet 0 01/09/2016 at Unknown time  . metoCLOPramide (REGLAN) 5 MG tablet Take 1 tablet (5 mg total) by mouth 4 (four) times daily -  before meals and at bedtime. 90 tablet 0 01/09/2016 at Unknown time  . metoprolol tartrate (LOPRESSOR) 25 MG tablet Take 25 mg by mouth 2 (two) times daily.    01/09/2016 at 2130  . morphine (MS CONTIN) 15 MG 12 hr tablet Take 1 tablet (15 mg total) by mouth every 8 (eight) hours. 75 tablet 0 01/09/2016 at 2200  . oxyCODONE (OXY IR/ROXICODONE) 5 MG immediate release tablet Take 5-10 mg by mouth every 6 (six) hours as needed for severe pain.   01/10/2016 at 0000  . pantoprazole (PROTONIX) 40 MG tablet Take 40 mg by mouth 2 (two) times daily before a meal.   01/09/2016 at Unknown time  . rifaximin (XIFAXAN) 550 MG TABS tablet Take 1 tablet (550 mg total) by mouth 2 (two) times daily. 60 tablet 6 01/09/2016 at Unknown time  . tiZANidine (ZANAFLEX) 4 MG tablet Take 4 mg by mouth 3 (three) times  daily as needed for muscle spasms.    01/09/2016 at Unknown time   Scheduled: . amLODipine  5 mg Oral Daily  . apixaban  5 mg Oral BID  . atorvastatin  40 mg Oral QHS  . citalopram  20 mg Oral Daily  . dextrose      . dicyclomine  10 mg Oral TID AC & HS  . docusate sodium  100 mg Oral BID  . insulin aspart  0-5 Units Subcutaneous QHS  . insulin aspart  0-9 Units Subcutaneous TID WC  . insulin detemir  17 Units Subcutaneous QHS  . levothyroxine  100 mcg Oral QAC breakfast  . magnesium oxide  400 mg Oral BID  . metoCLOPramide  5 mg Oral TID AC & HS  . metoprolol tartrate  75 mg Oral BID  . mometasone-formoterol  2 puff Inhalation BID  . pantoprazole  40 mg Oral BID AC  . rifaximin  550 mg Oral BID  . sodium chloride flush  3 mL Intravenous Q12H    ROS: History obtained from the patient  General ROS: negative for - chills, fatigue, fever, night sweats, weight gain or weight loss Psychological ROS: negative for - behavioral disorder, hallucinations, memory difficulties, mood swings or suicidal ideation Ophthalmic ROS: negative for - blurry vision, double vision, eye pain or loss of vision ENT ROS: negative for - epistaxis, nasal discharge, oral lesions, sore throat, tinnitus or vertigo Allergy and Immunology ROS: negative for - hives or itchy/watery eyes Hematological and Lymphatic ROS: negative for - bleeding problems, bruising or swollen lymph nodes Endocrine ROS: negative for - galactorrhea, hair pattern changes, polydipsia/polyuria or temperature intolerance Respiratory ROS: negative for - cough, hemoptysis, shortness of breath or wheezing Cardiovascular ROS: negative for - chest pain, dyspnea on exertion, edema or irregular heartbeat Gastrointestinal ROS: negative for - abdominal pain, diarrhea, hematemesis, nausea/vomiting or stool incontinence Genito-Urinary ROS: negative for - dysuria, hematuria, incontinence or urinary frequency/urgency Musculoskeletal ROS: negative for -  joint swelling or muscular weakness Neurological ROS: as noted in HPI Dermatological ROS: negative for rash and skin lesion changes  Physical Examination: Blood pressure 136/80, pulse 96, temperature 97.9 F (36.6 C), temperature source Axillary, resp. rate 12, height 5' 3"  (1.6 m), weight 62.1 kg (136 lb 14.5 oz), SpO2 97 %.  HEENT-  Normocephalic, no lesions, without obvious abnormality.  Normal external eye and conjunctiva.  Normal TM's bilaterally.  Normal auditory canals and external ears. Normal external nose, mucus membranes and septum.  Normal pharynx. Cardiovascular- S1, S2 normal, pulses palpable throughout   Lungs- chest clear, no wheezing, rales, normal symmetric air entry, Heart exam - S1, S2 normal, no murmur, no gallop, rate regular Abdomen- soft, non-tender; bowel sounds normal; no masses,  no organomegaly Extremities- no edema Lymph-no adenopathy palpable Musculoskeletal-no joint tenderness, deformity or swelling Skin-warm and dry, no hyperpigmentation, vitiligo, or suspicious lesions  Neurological Examination Mental Status: Alert, oriented, thought content appropriate.  Speech fluent without evidence of aphasia.  Able to follow 3 step commands without difficulty. Cranial Nerves: II: Discs flat bilaterally; Visual fields grossly normal, pupils equal, round, reactive to light and accommodation III,IV, VI: ptosis not present, extra-ocular motions intact bilaterally V,VII: smile symmetric, facial light touch sensation decreased on the left VIII: hearing normal bilaterally IX,X: gag reflex present XI: bilateral shoulder shrug XII: midline tongue extension Motor: Right : Upper extremity   5/5    Left:     Upper extremity   5/5, no drift  Lower extremity   5/5     Lower extremity   4+-5-/5 Give-way weakness throughout.  Decreased effort on left sided testing Sensory: Pinprick and light touch decreased on the left Deep Tendon Reflexes: 2+ and symmetric with absent AJ's  bilaterally Plantars: Right: upgoing   Left: upgoing Cerebellar: Normal finger-to-nose and normal heel-to-shin testing bilaterally Gait: not tested due to safety concerns     Laboratory Studies:   Basic Metabolic Panel:  Recent Labs Lab 01/10/16 1513  NA 138  K 3.5  CL 106  CO2 21*  GLUCOSE 203*  BUN 13  CREATININE 0.86  CALCIUM 9.1  Liver Function Tests:  Recent Labs Lab 01/10/16 1513  AST 29  ALT 24  ALKPHOS 105  BILITOT 1.4*  PROT 7.5  ALBUMIN 3.9    Recent Labs Lab 01/10/16 1513  LIPASE 22   No results for input(s): AMMONIA in the last 168 hours.  CBC:  Recent Labs Lab 01/10/16 1513 01/12/16 0330 01/13/16 0416  WBC 6.9 10.0 8.5  HGB 11.3* 9.7* 10.7*  HCT 35.2 30.1* 32.6*  MCV 87.4 86.1 84.8  PLT 142* 161 141*    Cardiac Enzymes:  Recent Labs Lab 01/11/16 0403 01/11/16 0946 01/11/16 1553 01/11/16 2220 01/12/16 0330  TROPONINI 0.28* 0.49* 0.42* 0.26* 0.19*    BNP: Invalid input(s): POCBNP  CBG:  Recent Labs Lab 01/11/16 2056 01/12/16 0717 01/12/16 1120 01/12/16 1625 01/12/16 2218  GLUCAP 199* 81 84 104* 181*    Microbiology: Results for orders placed or performed during the hospital encounter of 01/10/16  Blood culture (routine x 2)     Status: None (Preliminary result)   Collection Time: 01/10/16  5:10 PM  Result Value Ref Range Status   Specimen Description BLOOD LEFT HAND  Final   Special Requests BOTTLES DRAWN AEROBIC AND ANAEROBIC  5CC  Final   Culture NO GROWTH 3 DAYS  Final   Report Status PENDING  Incomplete  Blood culture (routine x 2)     Status: None (Preliminary result)   Collection Time: 01/10/16  5:22 PM  Result Value Ref Range Status   Specimen Description BLOOD RIGHT HAND  Final   Special Requests BOTTLES DRAWN AEROBIC AND ANAEROBIC  1CC  Final   Culture NO GROWTH 3 DAYS  Final   Report Status PENDING  Incomplete  MRSA PCR Screening     Status: None   Collection Time: 01/12/16  5:41 PM  Result  Value Ref Range Status   MRSA by PCR NEGATIVE NEGATIVE Final    Comment:        The GeneXpert MRSA Assay (FDA approved for NASAL specimens only), is one component of a comprehensive MRSA colonization surveillance program. It is not intended to diagnose MRSA infection nor to guide or monitor treatment for MRSA infections.     Coagulation Studies:  Recent Labs  01/11/16 1553  LABPROT 20.1*  INR 1.71    Urinalysis:  Recent Labs Lab 01/10/16 1958  COLORURINE YELLOW*  LABSPEC 1.026  PHURINE 5.0  GLUCOSEU >500*  HGBUR 2+*  BILIRUBINUR NEGATIVE  KETONESUR 1+*  PROTEINUR NEGATIVE  NITRITE NEGATIVE  LEUKOCYTESUR NEGATIVE    Lipid Panel:     Component Value Date/Time   CHOL 181 12/28/2015 0741   CHOL 182 11/12/2011 0325   TRIG 106 12/28/2015 0741   TRIG 149 11/12/2011 0325   HDL 55 12/28/2015 0741   HDL 23* 11/12/2011 0325   CHOLHDL 3.3 12/28/2015 0741   VLDL 21 12/28/2015 0741   VLDL 30 11/12/2011 0325   LDLCALC 105* 12/28/2015 0741   LDLCALC 129* 11/12/2011 0325    HgbA1C:  Lab Results  Component Value Date   HGBA1C 7.7* 01/10/2016    Urine Drug Screen:     Component Value Date/Time   LABOPIA POSITIVE* 01/10/2016 1958   LABOPIA POSITIVE* 11/02/2007 0055   COCAINSCRNUR NONE DETECTED 01/10/2016 1958   COCAINSCRNUR NONE DETECTED 11/02/2007 0055   LABBENZ NONE DETECTED 01/10/2016 1958   LABBENZ NONE DETECTED 11/02/2007 0055   AMPHETMU NONE DETECTED 01/10/2016 1958   AMPHETMU NONE DETECTED 11/02/2007 [place] DETECTED 01/10/2016 1958  THCU NONE DETECTED 11/02/2007 0055   LABBARB NONE DETECTED 01/10/2016 1958   LABBARB  11/02/2007 0055    NONE DETECTED        DRUG SCREEN FOR MEDICAL PURPOSES ONLY.  IF CONFIRMATION IS NEEDED FOR ANY PURPOSE, NOTIFY LAB WITHIN 5 DAYS.    Alcohol Level: No results for input(s): ETH in the last 168 hours.  Other results: EKG: sinus tachycardia at 108 bpm.  Imaging: Ct Head Wo Contrast  01/12/2016   CLINICAL DATA:  History of stroke. Code blue after cardiac cath today. EXAM: CT HEAD WITHOUT CONTRAST TECHNIQUE: Contiguous axial images were obtained from the base of the skull through the vertex without intravenous contrast. COMPARISON:  December 27, 2015 FINDINGS: Paranasal sinuses, mastoid air cells, and bones are within normal limits. Extracranial soft tissues are normal. No subdural, epidural, or subarachnoid hemorrhage. No mass, mass effect or midline shift. Ventricles and sulci are normal. No acute cortical ischemia or infarct. The cerebellum, brainstem, and basal cisterns are normal. No other acute abnormalities. There is an oval region of decreased attenuation in the white matter of the left frontal lobe on series 2, image 19. This was present previously but is more conspicuous today, likely due to interval resolution of a nonacute lacunar infarct. IMPRESSION: No acute intracranial process. Electronically Signed   By: Dorise Bullion III M.D   On: 01/12/2016 15:33     Assessment/Plan: 60 year old female with an episode of decreased responsiveness.  Left sided weakness noted as well that per examination today is unchanged from her evaluation in April.  Patient agrees.  Patient on Eliquis.  Head CT personally reviewed and shows no acute changes.  Previous imaging unremarkable.  Patient has had EEG in the past (05/2015) that was unremarkable.    Recommendations: 1.  EEG.  May be done as an outpatient. 2.  Agree with continued Eliquis.  No further imaging required.  Alexis Goodell, MD Neurology [phone] 01/13/2016, 11:13 AM

## 2016-01-13 NOTE — Telephone Encounter (Signed)
TCM call. Pt has appt w/Chris Berge May 23, 9am  No answer, no VM.  Will call again.

## 2016-01-13 NOTE — Evaluation (Signed)
Physical Therapy Evaluation Patient Details Name: [name] MRN: [id number] DOB: 01-08-1956 Today's Date: 01/13/2016   History of Present Illness  60 y/o female here with chest pain and tachycardia.  She has h/o L sided weakness that is at baseline.    Clinical Impression  Pt does well with ambulate using walker (able to maintain consistent and appropriate cadence).  She reports she is at her baseline and hopes to go home tomorrow.  She agrees that she does not need further PT intervention at home, pt appears to be independent with in-home activities and has assist from family at home if needed. Pt does have chronic L sided weakness that is apparently at baseline and does not seem to effect her ambulation when using AD.      Follow Up Recommendations No PT follow up    Equipment Recommendations  None recommended by PT    Recommendations for Other Services       Precautions / Restrictions Precautions Precautions: Fall Restrictions Weight Bearing Restrictions: No      Mobility  Bed Mobility               General bed mobility comments: Pt in recliner on arrival, not tested  Transfers Overall transfer level: Independent Equipment used: Rolling walker (2 wheeled) Transfers: Sit to/from Stand Sit to Stand:  (Pt able to get to standing w/o assist, shows good confidence)            Ambulation/Gait Ambulation/Gait assistance: Modified independent (Device/Increase time) Ambulation Distance (Feet): 175 Feet Assistive device: Rolling walker (2 wheeled)       General Gait Details: Pt shows good confidence walking with walker, has consistent cadence and speed and no safety issues.  She did have brief episodes of increased heart rate (up to 130) but generally stayed 95-110 t/o the effort.  Pt did not have signficant fatigue or other issues.   Stairs            Wheelchair Mobility    Modified Rankin (Stroke Patients Only)       Balance                                              Pertinent Vitals/Pain Pain Assessment: No/denies pain    Home Living Family/patient expects to be discharged to:: Private residence Living Arrangements: Spouse/significant other;Children Available Help at Discharge: Family;Available 24 hours/day Type of Home: Mobile home Home Access: Ramped entrance     Home Layout: One level Home Equipment: [place] - 2 wheels;Cane - single point;Bedside commode      Prior Function Level of Independence: Independent with assistive device(s)         Comments: Pt reports that she has had ~5 falls in the last 6 months and has no been as active as her normal for the last few weaks.      Hand Dominance        Extremity/Trunk Assessment   Upper Extremity Assessment: LUE deficits/detail (L UE grossly 3+/5)           Lower Extremity Assessment: LLE deficits/detail (L LE grossly 3+/5)         Communication   Communication: No difficulties  Cognition Arousal/Alertness: Awake/alert Behavior During Therapy: WFL for tasks assessed/performed Overall Cognitive Status: Within Functional Limits for tasks assessed  General Comments      Exercises        Assessment/Plan    PT Assessment Patient needs continued PT services  PT Diagnosis Generalized weakness   PT Problem List Decreased strength;Decreased activity tolerance;Decreased balance;Decreased coordination;Decreased safety awareness  PT Treatment Interventions Gait training;Therapeutic activities;Therapeutic exercise;Balance training;Neuromuscular re-education;Patient/family education   PT Goals (Current goals can be found in the Care Plan section) Acute Rehab PT Goals Patient Stated Goal: "go home" PT Goal Formulation: With patient Time For Goal Achievement: 01/27/16 Potential to Achieve Goals: Good    Frequency Min 2X/week   Barriers to discharge        Co-evaluation               End of  Session Equipment Utilized During Treatment: Gait belt Activity Tolerance: Patient tolerated treatment well Patient left: with nursing/sitter in room;in chair;with call bell/phone within reach Nurse Communication: Mobility status         Time: 2300-9794 PT Time Calculation (min) (ACUTE ONLY): 23 min   Charges:   PT Evaluation $PT Eval Low Complexity: 1 Procedure     PT G Codes:       Wayne Both, PT, DPT [phone]  Kreg Shropshire 01/13/2016, 11:20 AM

## 2016-01-13 NOTE — Discharge Instructions (Signed)
Angiogram An angiogram is an X-ray test. It is used to look at your blood vessels. For this test, a dye is put into the blood vessel being checked. The dye shows up on X-rays. It helps your doctor see if there is a blockage or other problem in the blood vessel. BEFORE THE PROCEDURE  Follow your doctor's instructions about limiting what you eat or drink.  Ask your doctor if you may drink enough water to take any needed medicines the morning of the test.  Plan to have someone take you home after the test.  If you go home the same day as the test, plan to have someone stay with you for 24 hours. PROCEDURE   An IV tube will be put into one of your veins.  You will be given a medicine that makes you relax (sedative).  Your skin will be washed and shaved where the thin tube (catheter) will be inserted. This will usually be done in the upper part of your leg (groin). It may also be done in your arm near the elbow or in your wrist.  You will be given a medicine that numbs the area where the tube will be inserted (local anesthetic).  The tube will be inserted into a blood vessel.  Using a type of X-ray (fluoroscopy) to see, your doctor will move the tube into the blood vessel to check it.  Dye will be put in through the tube. X-rays of your blood vessels will then be taken. Different health care providers and hospitals may do this procedure differently. AFTER THE PROCEDURE   If the test is done through the leg, you will be kept in bed lying flat for several hours. You will be told to not bend or cross your legs.   The area where the tube was inserted will be checked often.   The pulse in your feet or wrist will be checked often.   More tests or X-rays may be done.    This information is not intended to replace advice given to you by your health care provider. Make sure you discuss any questions you have with your health care provider.   Document Released: 11/23/2008 Document  Revised: 09/17/2014 Document Reviewed: 01/28/2013 Elsevier Interactive Patient Education 2016 [employer]  Nausea and Vomiting Nausea means you feel sick to your stomach. Throwing up (vomiting) is a reflex where stomach contents come out of your mouth. HOME CARE   Take medicine as told by your doctor.  Do not force yourself to eat. However, you do need to drink fluids.  If you feel like eating, eat a normal diet as told by your doctor.  Eat rice, wheat, potatoes, bread, lean meats, yogurt, fruits, and vegetables.  Avoid high-fat foods.  Drink enough fluids to keep your pee (urine) clear or pale yellow.  Ask your doctor how to replace body fluid losses (rehydrate). Signs of body fluid loss (dehydration) include:  Feeling very thirsty.  Dry lips and mouth.  Feeling dizzy.  Dark pee.  Peeing less than normal.  Feeling confused.  Fast breathing or heart rate. GET HELP RIGHT AWAY IF:   You have blood in your throw up.  You have black or bloody poop (stool).  You have a bad headache or stiff neck.  You feel confused.  You have bad belly (abdominal) pain.  You have chest pain or trouble breathing.  You do not pee at least once every 8 hours.  You have cold, clammy skin.  You keep throwing up after 24 to 48 hours.  You have a fever. MAKE SURE YOU:   Understand these instructions.  Will watch your condition.  Will get help right away if you are not doing well or get worse.   This information is not intended to replace advice given to you by your health care provider. Make sure you discuss any questions you have with your health care provider.   Document Released: 02/13/2008 Document Revised: 11/19/2011 Document Reviewed: 01/26/2011 Elsevier Interactive Patient Education [employer].

## 2016-01-13 NOTE — Progress Notes (Signed)
Patient: [name] / Admit Date: 01/10/2016 / Date of Encounter: 01/13/2016, 10:04 AM   Subjective: Patient had cardiac cath on the PM of 5/4 that showed minimal luminal irregularities without evidence of obstructive CAD. She had mildly to moderately reduced LV systolic function with an EF of 40% with WM abnormality suggestive of stress-induced cardiomyopathy. Mildly elevated LVEDP. After her cardiac cath the patient had a narrow complex tachycardia with HR of 153 bpm coupled with brief episode of respiratory failure. Code blue was called. She responded to sternal rub by RN. She was breathing independently and had stable vital signs. Upon coming to she was confused and had left sided weakness (though this was present at baseline from prior CVA). Thus she underwent stat CT head w/o contrast which was negative for acute intracranial process. She was transferred to the ICU overnight for monitoring, had no issues, and rested well. Much more alert this morning. Back to her baseline without any new deficits. No new complaints. No SOB or current chest pain. No palpitations, nausea, vomiting, or diarrhea.   Review of Systems: Review of Systems  Constitutional: Positive for weight loss and malaise/fatigue. Negative for fever, chills and diaphoresis.  HENT: Negative for congestion.   Eyes: Negative for discharge and redness.  Respiratory: Positive for cough. Negative for hemoptysis, sputum production, shortness of breath and wheezing.   Cardiovascular: Negative for chest pain, palpitations, orthopnea, claudication, leg swelling and PND.  Gastrointestinal: Negative for heartburn, nausea, vomiting, abdominal pain, diarrhea, constipation, blood in stool and melena.  Genitourinary: Negative for hematuria.  Musculoskeletal: Negative for myalgias and falls.  Skin: Negative for rash.  Neurological: Positive for weakness. Negative for dizziness, tingling, tremors, sensory change, speech change, focal weakness  and loss of consciousness.  Endo/Heme/Allergies: Does not bruise/bleed easily.  Psychiatric/Behavioral: The patient is not nervous/anxious.     Objective: Telemetry: NSR, 80's bpm Physical Exam: Blood pressure 136/80, pulse 96, temperature 97.9 F (36.6 C), temperature source Axillary, resp. rate 12, height 5' 3"  (1.6 m), weight 136 lb 14.5 oz (62.1 kg), SpO2 97 %. Body mass index is 24.26 kg/(m^2). General: Well developed, well nourished, in no acute distress. Head: Normocephalic, atraumatic, sclera non-icteric, no xanthomas, nares are without discharge. Neck: Negative for carotid bruits. JVP not elevated. Lungs: Clear bilaterally to auscultation without wheezes, rales, or rhonchi. Breathing is unlabored. Heart: RRR S1 S2 without murmurs, rubs, or gallops.  Abdomen: Soft, non-tender, non-distended with normoactive bowel sounds. No rebound/guarding. Extremities: No clubbing or cyanosis. No edema. Distal pedal pulses are 2+ and equal bilaterally. Neuro: Alert and oriented X 3. Moves all extremities spontaneously. Psych:  Responds to questions appropriately with a normal affect.   Intake/Output Summary (Last 24 hours) at 01/13/16 1004 Last data filed at 01/13/16 0900  Gross per 24 hour  Intake 3883.33 ml  Output   1500 ml  Net 2383.33 ml    Inpatient Medications:  . amLODipine  5 mg Oral Daily  . apixaban  5 mg Oral BID  . atorvastatin  40 mg Oral QHS  . citalopram  20 mg Oral Daily  . dextrose      . dicyclomine  10 mg Oral TID AC & HS  . docusate sodium  100 mg Oral BID  . insulin aspart  0-5 Units Subcutaneous QHS  . insulin aspart  0-9 Units Subcutaneous TID WC  . insulin detemir  17 Units Subcutaneous QHS  . levothyroxine  100 mcg Oral QAC breakfast  . magnesium oxide  400 mg Oral BID  . metoCLOPramide  5 mg Oral TID AC & HS  . metoprolol tartrate  75 mg Oral BID  . mometasone-formoterol  2 puff Inhalation BID  . pantoprazole  40 mg Oral BID AC  . rifaximin  550 mg  Oral BID  . sodium chloride flush  3 mL Intravenous Q12H   Infusions:    Labs:  Recent Labs  01/10/16 1513  NA 138  K 3.5  CL 106  CO2 21*  GLUCOSE 203*  BUN 13  CREATININE 0.86  CALCIUM 9.1    Recent Labs  01/10/16 1513  AST 29  ALT 24  ALKPHOS 105  BILITOT 1.4*  PROT 7.5  ALBUMIN 3.9    Recent Labs  01/12/16 0330 01/13/16 0416  WBC 10.0 8.5  HGB 9.7* 10.7*  HCT 30.1* 32.6*  MCV 86.1 84.8  PLT 161 141*    Recent Labs  01/11/16 0946 01/11/16 1553 01/11/16 2220 01/12/16 0330  TROPONINI 0.49* 0.42* 0.26* 0.19*   Invalid input(s): POCBNP  Recent Labs  01/10/16 2208  HGBA1C 7.7*     Weights: Filed Weights   01/12/16 0625 01/12/16 1600 01/13/16 0500  Weight: 127 lb 11.2 oz (57.924 kg) 135 lb 5.8 oz (61.4 kg) 136 lb 14.5 oz (62.1 kg)     Radiology/Studies:  Dg Chest 2 View  12/29/2015  CLINICAL DATA:  Shortness of breath.  Uterine cancer. EXAM: CHEST  2 VIEW COMPARISON:  12/27/2015 chest radiograph. FINDINGS: Low lung volumes. Stable cardiomediastinal silhouette with top-normal heart size. No pneumothorax. No pleural effusion. Stable mild-to-moderate elevation of the right hemidiaphragm. No pulmonary edema. Mild bibasilar atelectasis. No acute consolidative airspace disease. IMPRESSION: Low lung volumes. Stable elevation of the right hemidiaphragm with mild bibasilar atelectasis. Electronically Signed   By: Ilona Sorrel M.D.   On: 12/29/2015 12:30   Dg Chest 2 View  12/27/2015  CLINICAL DATA:  Weakness.  Cough. EXAM: CHEST  2 VIEW COMPARISON:  Chest from abdominal radiographs earlier this day at 1744 hour FINDINGS: Unchanged elevation of right hemidiaphragm. There is adjacent atelectasis or scarring at the lung bases. Cardiomediastinal contours are unchanged with stable cardiomegaly. Probable vascular congestion. No evidence pulmonary edema. Trace left pleural effusion IMPRESSION: Unchanged elevation of right hemidiaphragm. Unchanged cardiomegaly. No  new abnormality is seen. Electronically Signed   By: Jeb Levering M.D.   On: 12/27/2015 23:50   Dg Chest 2 View  12/17/2015  CLINICAL DATA:  Recent onset of cough; admitted yesterday; recently hospitalized for pneumonia; hx/o cirrhosis, htn, paf, and cancer of uterus EXAM: CHEST  2 VIEW COMPARISON:  12/16/2015 FINDINGS: Since the previous day's study, areas of airspace opacity have developed, most evident in the right upper lobe, but also noted in the right lower lobe and left lower lobe. No pleural effusion or pneumothorax. Cardiac silhouette is normal in size and configuration. No mediastinal or hilar masses or convincing adenopathy. Bony thorax is intact. IMPRESSION: 1. Multifocal pneumonia has become apparent since the previous day's study. Electronically Signed   By: Lajean Manes M.D.   On: 12/17/2015 12:23   Dg Abd 1 View  12/30/2015  CLINICAL DATA:  Ileus. EXAM: ABDOMEN - 1 VIEW COMPARISON:  12/29/2015. FINDINGS: Residual oral contrast and stool are seen in the ascending colon. Mild gaseous distention of remaining colon, minimally improved. Gas is seen in nondistended small bowel. IMPRESSION: Minimal improvement in colonic ileus. Electronically Signed   By: Lorin Picket M.D.   On: 12/30/2015 08:36  Dg Abd 1 View  12/29/2015  CLINICAL DATA:  Ascending colitis with septic shock.  Ileus. EXAM: ABDOMEN - 1 VIEW COMPARISON:  None. FINDINGS: Gaseous distension of the colon identified. There is been interval increase in caliber of the large bowel loops with decrease and small bowel air. Enteric contrast material remains within the right colon. IMPRESSION: 1. Imaging findings compatible with colonic ileus. Electronically Signed   By: Kerby Moors M.D.   On: 12/29/2015 08:57   Ct Head Wo Contrast  01/12/2016  CLINICAL DATA:  History of stroke. Code blue after cardiac cath today. EXAM: CT HEAD WITHOUT CONTRAST TECHNIQUE: Contiguous axial images were obtained from the base of the skull through the  vertex without intravenous contrast. COMPARISON:  December 27, 2015 FINDINGS: Paranasal sinuses, mastoid air cells, and bones are within normal limits. Extracranial soft tissues are normal. No subdural, epidural, or subarachnoid hemorrhage. No mass, mass effect or midline shift. Ventricles and sulci are normal. No acute cortical ischemia or infarct. The cerebellum, brainstem, and basal cisterns are normal. No other acute abnormalities. There is an oval region of decreased attenuation in the white matter of the left frontal lobe on series 2, image 19. This was present previously but is more conspicuous today, likely due to interval resolution of a nonacute lacunar infarct. IMPRESSION: No acute intracranial process. Electronically Signed   By: Dorise Bullion III M.D   On: 01/12/2016 15:33   Ct Head Wo Contrast  12/27/2015  CLINICAL DATA:  Abdominal pain for 4 days, nausea, vomiting, diarrhea, onset of LEFT-sided weakness at 1430 hours, history TIA, atrial fibrillation, diabetes mellitus, paroxysmal atrial fibrillation, essential hypertension, uterine cancer EXAM: CT HEAD WITHOUT CONTRAST TECHNIQUE: Contiguous axial images were obtained from the base of the skull through the vertex without intravenous contrast. COMPARISON:  11/11/2015 FINDINGS: Mild atrophy. Normal ventricular morphology. No midline shift or mass effect. Otherwise normal appearance of brain parenchyma. No intracranial hemorrhage, mass lesion, or evidence acute infarction. No extra-axial fluid collections. Visualized paranasal sinuses and mastoid air cells clear. Mild hyperostosis frontalis interna. No acute osseous findings. IMPRESSION: No acute intracranial abnormalities. Findings called to Dr. Reita Cliche on 12/27/2015 at 1703 hours. Electronically Signed   By: Lavonia Dana M.D.   On: 12/27/2015 17:04   Ct Chest Wo Contrast  12/17/2015  CLINICAL DATA:  Diagnosed with pneumonia 1 month ago. Coughing and wheezing has increased over the last couple days.  Shortness of breath today. EXAM: CT CHEST WITHOUT CONTRAST TECHNIQUE: Multidetector CT imaging of the chest was performed following the standard protocol without IV contrast. COMPARISON:  Chest x-rays dated 12/17/2015 and 11/14/2015. FINDINGS: Mediastinum/Lymph Nodes: Heart size is upper normal. No pericardial effusion. Scattered coronary artery calcifications noted. No masses or enlarged lymph nodes seen within the mediastinum or perihilar regions. Lungs/Pleura: Patchy consolidations throughout both lungs, involving all lobes, most dense within the right upper lobe and at the left lung base posteriorly. Majority of the consolidations are associated with ground-glass opacities suggesting atypical pneumonia or associated edema. Trachea and central bronchi are unremarkable. Upper abdomen: Free ascites is seen within the upper abdomen. Liver appears cirrhotic. Patient is status post cholecystectomy. Thickening of the walls of the upper right colon appear similar to the appearance on CT abdomen of 12/16/2015 compatible with the description of colitis on the associated report. Musculoskeletal: Mild scoliosis of the thoracic spine. No evidence of acute osseous abnormality. Superficial soft tissues are unremarkable. IMPRESSION: 1. Multi lobar consolidations, most dense within the right upper lobe and at  the left lung base posteriorly, with areas of associated ground-glass opacity suggesting atypical pneumonia and/or edema. Differential includes atypical pneumonias such as viral or fungal, interstitial pneumonias, edema related to volume overload/CHF, chronic interstitial diseases, hypersensitivity pneumonitis, and respiratory bronchiolitis. 2. Upper abdominal ascites, presumably associated with liver cirrhosis. Thickening of the walls of the upper ascending colon similar to its appearance on CT abdomen of 12/16/2015, compatible with the previous description of colitis on the earlier CT report. Electronically Signed   By:  Franki Cabot M.D.   On: 12/17/2015 17:13   Ct Angio Chest Pe W/cm &/or Wo Cm  01/10/2016  CLINICAL DATA:  Tachycardia and chest pain, abdominal pain EXAM: CT ANGIOGRAPHY CHEST CT ABDOMEN AND PELVIS WITH CONTRAST TECHNIQUE: Multidetector CT imaging of the chest was performed using the standard protocol during bolus administration of intravenous contrast. Multiplanar CT image reconstructions and MIPs were obtained to evaluate the vascular anatomy. Multidetector CT imaging of the abdomen and pelvis was performed using the standard protocol during bolus administration of intravenous contrast. CONTRAST:  100 mL Isovue 370. COMPARISON:  12/17/2015 12/27/2015 FINDINGS: CTA CHEST FINDINGS The lungs are well aerated bilaterally. No focal infiltrate or sizable effusion is seen. The previously noted infiltrates have resolved in the interval. The thoracic inlet is within normal limits. The thoracic aorta and its branches are unremarkable. No dissection or aneurysm is identified. The pulmonary artery is well visualized and demonstrates a normal branching pattern. No filling defects to suggest pulmonary emboli are identified. No significant hilar or mediastinal adenopathy is seen. The osseous structures show no acute abnormality. CT ABDOMEN and PELVIS FINDINGS The liver is shrunken with significant nodularity consistent with underlying cirrhosis. The gallbladder has been surgically removed. Some very mild perigastric varices are noted extending into the distal esophagus. No significant perisplenic varices are seen. Changes suggestive of prior fundoplication are seen in the proximal stomach. Mild ascites is noted predominately in the right lower quadrant as well as surrounding the liver superiorly. The spleen is mildly enlarged. The adrenal glands and pancreas are within normal limits. The kidneys are well visualized bilaterally with a small staghorn type calculus in the lower pole of the right kidney. No obstructive changes  are seen. Aortoiliac calcifications are noted without aneurysmal dilatation. The bladder is well distended. The appendix is within normal limits. No diverticulitis is seen. The osseous structures show no acute abnormality. Review of the MIP images confirms the above findings. IMPRESSION: No evidence of pulmonary emboli. Significant clearing of previously seen bilateral infiltrates. Changes of cirrhosis with portal hypertension, ascites and varices. Stable right renal calculus. Electronically Signed   By: Inez Catalina M.D.   On: 01/10/2016 19:42   Mr Jodene Nam Head Wo Contrast  12/29/2015  CLINICAL DATA:  60 year old female with vomiting and dizziness for several days. Initial encounter. EXAM: MRI HEAD WITHOUT CONTRAST MRA HEAD WITHOUT CONTRAST TECHNIQUE: Multiplanar, multiecho pulse sequences of the brain and surrounding structures were obtained without intravenous contrast. Angiographic images of the head were obtained using MRA technique without contrast. COMPARISON:  Head CT without contrast 12/27/2015. Brain MRI 10/04/2015. FINDINGS: MRI HEAD FINDINGS Major intracranial vascular flow voids are stable. No restricted diffusion to suggest acute infarction. No midline shift, mass effect, evidence of mass lesion, ventriculomegaly, extra-axial collection or acute intracranial hemorrhage. Cervicomedullary junction and pituitary are within normal limits. Negative visualized cervical spine. Stable gray and white matter signal throughout the brain; scattered nonspecific cerebral white matter T2 and FLAIR hyperintensity. Visible internal auditory structures appear normal. Mild  bilateral mastoid fluid is stable. Negative nasopharynx. Trace paranasal sinus mucosal thickening has not significantly changed. Orbit and scalp soft tissues are within normal limits. Normal bone marrow signal. MRA HEAD FINDINGS Antegrade flow in the posterior circulation with codominant distal vertebral arteries. Normal PICA origins and vertebrobasilar  junction. Normal basilar artery, AICA origins, SCA origins, and left PCA origin. Fetal type right PCA origin. Left posterior communicating artery is diminutive or absent. Bilateral PCA branches are within normal limits. Antegrade flow in both ICA siphons. No siphon stenosis. Normal ophthalmic and right posterior communicating artery origins. Normal carotid termini, MCA and ACA origins. Anterior communicating artery is diminutive. Visualized bilateral ACA and MCA branches are within normal limits. IMPRESSION: 1. No acute intracranial abnormality. Stable noncontrast MRI appearance of the brain since January. 2.  Negative intracranial MRA. Electronically Signed   By: Genevie Ann M.D.   On: 12/29/2015 13:46   Mr Angiogram Neck W Wo Contrast  12/29/2015  CLINICAL DATA:  60 year old female with vomiting and dizziness for several days. Initial encounter. EXAM: MRA NECK WITHOUT AND WITH CONTRAST TECHNIQUE: Multiplanar and multiecho pulse sequences of the neck were obtained without and with intravenous contrast. Angiographic images of the neck were obtained using MRA technique without and with intravenous contrast. CONTRAST:  55m MULTIHANCE GADOBENATE DIMEGLUMINE 529 MG/ML IV SOLN COMPARISON:  Brain MRI and intracranial MRA from today reported separately. FINDINGS: Precontrast time-of-flight images reveal antegrade flow in both carotid and vertebral arteries throughout the neck. Carotid bifurcations appear normal. Vertebral arteries are codominant. Post-contrast neck MRA images reveal a 3 vessel arch configuration. Tortuous proximal great vessels but no great vessel origin stenosis. Tortuous proximal right CCA. Widely patent right carotid bifurcation. Negative cervical right ICA. Tortuous proximal left CCA. Mild irregularity at the left carotid bifurcation primarily affecting the ECA origin. Widely patent left ICA origin and bulb. Negative cervical left ICA. Normal vertebral artery origins. Mildly dominant right vertebral  artery throughout the neck. No vertebral artery stenosis to the vertebrobasilar junction. IMPRESSION: Negative neck MRA aside from tortuosity of the great vessels. Electronically Signed   By: HGenevie AnnM.D.   On: 12/29/2015 14:01   Mr Brain Wo Contrast  12/29/2015  CLINICAL DATA:  60year old female with vomiting and dizziness for several days. Initial encounter. EXAM: MRI HEAD WITHOUT CONTRAST MRA HEAD WITHOUT CONTRAST TECHNIQUE: Multiplanar, multiecho pulse sequences of the brain and surrounding structures were obtained without intravenous contrast. Angiographic images of the head were obtained using MRA technique without contrast. COMPARISON:  Head CT without contrast 12/27/2015. Brain MRI 10/04/2015. FINDINGS: MRI HEAD FINDINGS Major intracranial vascular flow voids are stable. No restricted diffusion to suggest acute infarction. No midline shift, mass effect, evidence of mass lesion, ventriculomegaly, extra-axial collection or acute intracranial hemorrhage. Cervicomedullary junction and pituitary are within normal limits. Negative visualized cervical spine. Stable gray and white matter signal throughout the brain; scattered nonspecific cerebral white matter T2 and FLAIR hyperintensity. Visible internal auditory structures appear normal. Mild bilateral mastoid fluid is stable. Negative nasopharynx. Trace paranasal sinus mucosal thickening has not significantly changed. Orbit and scalp soft tissues are within normal limits. Normal bone marrow signal. MRA HEAD FINDINGS Antegrade flow in the posterior circulation with codominant distal vertebral arteries. Normal PICA origins and vertebrobasilar junction. Normal basilar artery, AICA origins, SCA origins, and left PCA origin. Fetal type right PCA origin. Left posterior communicating artery is diminutive or absent. Bilateral PCA branches are within normal limits. Antegrade flow in both ICA siphons. No siphon stenosis. Normal ophthalmic  and right posterior  communicating artery origins. Normal carotid termini, MCA and ACA origins. Anterior communicating artery is diminutive. Visualized bilateral ACA and MCA branches are within normal limits. IMPRESSION: 1. No acute intracranial abnormality. Stable noncontrast MRI appearance of the brain since January. 2.  Negative intracranial MRA. Electronically Signed   By: Genevie Ann M.D.   On: 12/29/2015 13:46   Ct Abdomen Pelvis W Contrast  01/10/2016  CLINICAL DATA:  Tachycardia and chest pain, abdominal pain EXAM: CT ANGIOGRAPHY CHEST CT ABDOMEN AND PELVIS WITH CONTRAST TECHNIQUE: Multidetector CT imaging of the chest was performed using the standard protocol during bolus administration of intravenous contrast. Multiplanar CT image reconstructions and MIPs were obtained to evaluate the vascular anatomy. Multidetector CT imaging of the abdomen and pelvis was performed using the standard protocol during bolus administration of intravenous contrast. CONTRAST:  100 mL Isovue 370. COMPARISON:  12/17/2015 12/27/2015 FINDINGS: CTA CHEST FINDINGS The lungs are well aerated bilaterally. No focal infiltrate or sizable effusion is seen. The previously noted infiltrates have resolved in the interval. The thoracic inlet is within normal limits. The thoracic aorta and its branches are unremarkable. No dissection or aneurysm is identified. The pulmonary artery is well visualized and demonstrates a normal branching pattern. No filling defects to suggest pulmonary emboli are identified. No significant hilar or mediastinal adenopathy is seen. The osseous structures show no acute abnormality. CT ABDOMEN and PELVIS FINDINGS The liver is shrunken with significant nodularity consistent with underlying cirrhosis. The gallbladder has been surgically removed. Some very mild perigastric varices are noted extending into the distal esophagus. No significant perisplenic varices are seen. Changes suggestive of prior fundoplication are seen in the proximal  stomach. Mild ascites is noted predominately in the right lower quadrant as well as surrounding the liver superiorly. The spleen is mildly enlarged. The adrenal glands and pancreas are within normal limits. The kidneys are well visualized bilaterally with a small staghorn type calculus in the lower pole of the right kidney. No obstructive changes are seen. Aortoiliac calcifications are noted without aneurysmal dilatation. The bladder is well distended. The appendix is within normal limits. No diverticulitis is seen. The osseous structures show no acute abnormality. Review of the MIP images confirms the above findings. IMPRESSION: No evidence of pulmonary emboli. Significant clearing of previously seen bilateral infiltrates. Changes of cirrhosis with portal hypertension, ascites and varices. Stable right renal calculus. Electronically Signed   By: Inez Catalina M.D.   On: 01/10/2016 19:42   Ct Abdomen Pelvis W Contrast  12/27/2015  CLINICAL DATA:  Vomiting and dizziness. Abdominal pain. Weakness. Lightheadedness. Cirrhosis. EXAM: CT ABDOMEN AND PELVIS WITH CONTRAST TECHNIQUE: Multidetector CT imaging of the abdomen and pelvis was performed using the standard protocol following bolus administration of intravenous contrast. CONTRAST:  155m ISOVUE-300 IOPAMIDOL (ISOVUE-300) INJECTION 61% COMPARISON:  Multiple exams, including 12/16/2015 CT scan FINDINGS: Lower chest: Mild interstitial accentuation in both lower lobes, similar to 12/16/2015. Cardiomegaly with left ventricular hypertrophy. Trace left pleural effusion. Trace pericardial effusion. Small uphill varices adjacent to the distal esophagus. Hepatobiliary: Advanced cirrhosis. No focal liver mass seen. Cholecystectomy. Pancreas: Unremarkable Spleen: Unremarkable Adrenals/Urinary Tract: Adrenal glands normal. Right kidney lower pole 9 mm calculus, not appreciably changed. Small hypodense lesions of the right kidney are statistically likely to be cysts but  technically too small to characterize. No hydronephrosis, ureteral calculus, or bladder calculus seen. Stomach/Bowel: Wall thickening of the stomach along the gastric cardia, somewhat resembling a fundoplication, correlate with operative history. Several mildly dilated loops  of jejunum in the left abdomen, without a transition point to suggest obstruction. Equivocal wall thickening in the ascending colon. Vascular/Lymphatic: Aortoiliac atherosclerotic vascular disease. Gastric varices. Splenorenal shunting. Portal vein and splenic vein patent. No pathologic adenopathy identified. Reproductive: Uterus absent.  Ovaries not well seen. Other: Considerable ascites, increased from the exam 11 days ago. Increased mesenteric and subcutaneous edema. Musculoskeletal: Healing fractures the right anterior fourth, fifth, sixth, seventh, and eighth ribs with associated rib sclerosis, not appreciably changed from 12/16/2015. IMPRESSION: 1. Compared to the exam from 11 days ago, there is worsening third spacing of fluid with diffuse mesenteric and subcutaneous edema along with increase in ascites. 2. Cirrhosis. 3. Trace left pleural effusion and trace pericardial effusion. 4. Healing anterior fourth through eighth rib fractures on the right. 5. Cardiomegaly with left ventricular hypertrophy. 6. The uphill esophageal varices and gastric varices. Splenorenal shunting. 7. Nonobstructive right kidney lower pole 9 mm calculus. 8. Wall thickening along the gastric cardia, query prior fundoplication. 9. Equivocal wall thickening in the ascending colon, possibly from third spacing of fluid or mild proximal colitis. 10.  Aortoiliac atherosclerotic vascular disease. Electronically Signed   By: Van Clines M.D.   On: 12/27/2015 21:19   Ct Abdomen Pelvis W Contrast  12/16/2015  CLINICAL DATA:  Abdominal pain, nausea, vomiting and diarrhea. EXAM: CT ABDOMEN AND PELVIS WITH CONTRAST TECHNIQUE: Multidetector CT imaging of the abdomen and  pelvis was performed using the standard protocol following bolus administration of intravenous contrast. CONTRAST:  146m ISOVUE-300 IOPAMIDOL (ISOVUE-300) INJECTION 61% COMPARISON:  Abdominal series today as well as prior CT of the abdomen and pelvis on 11/14/2015. FINDINGS: Lower chest: The visualized lung bases show bibasilar atelectasis. No pleural fluid identified. Hepatobiliary: There again is evidence of cirrhosis with grossly nodular appearance of the liver. There is associated ascites surrounding the liver and also elsewhere in the peritoneal cavity. Overall volume of ascites is small to moderate. There is evidence of portal hypertension with distal esophageal and gastroesophageal varices identified supplied by the left gastric vein as well as probable supply from short gastric veins and a gastro-renal shunt emanating from the left renal vein. Pancreas: No mass, inflammatory changes, or other significant abnormality. Spleen: Within normal limits in size and appearance. Adrenals/Urinary Tract: No evidence of hydronephrosis. Nonobstructing calculus in the lower pole of the right kidney present measuring approximately 10 mm in greatest diameter. Stomach/Bowel: There is evidence of new colitis with significant circumferential thickening involving the ascending and transverse colon. Colonic thickening appears extends just into the proximal descending colon. No evidence of bowel perforation or focal abscess. No small bowel dilatation or obstruction. The stomach continues to show evidence of prominent folds at the level of the gastric cardia and GE junction extending into the body of the stomach. Prominence at the level of the gastric cardia is more pronounced compared to prior studies and it may help to correlate this finding with repeat EGD. Vascular/Lymphatic: No pathologically enlarged lymph nodes. No evidence of abdominal aortic aneurysm. Reproductive: The uterus has been removed. Other: No hernias  identified. Musculoskeletal: Bony structures show stable spondylosis at L5-S1 a mild leftward convex scoliosis of the lumbar spine. IMPRESSION: 1. New colitis involving the ascending and transverse colon. No evidence of bowel perforation, focal abscess or small bowel obstruction. 2. Cirrhosis and portal hypertension with distal esophageal and gastroesophageal varices identified supplied by probable left gastric and gastro-renal shunts. 3. Some increased prominence of folds at the level of the gastric cardia extending into the body  of the stomach. Some of these changes were apparently secondary to prior fundoplication and gastric inflammation by prior EGD. Correlation with repeat EGD may be helpful, especially given the CT evidence of definite distal esophageal varices. Electronically Signed   By: Aletta Edouard M.D.   On: 12/16/2015 15:48   Dg Chest Portable 1 View  01/10/2016  CLINICAL DATA:  60 year old female with tachycardia and chest pain for 3 days with shortness of breath and weakness. Initial encounter. EXAM: PORTABLE CHEST 1 VIEW COMPARISON:  12/29/2015 and earlier. FINDINGS: Portable AP upright view at 1532 hours. Low but improved lung volumes. Stable cardiac size and mediastinal contours. Allowing for portable technique, the lungs are clear. No pneumothorax or pleural effusion. Calcified aortic atherosclerosis. IMPRESSION: No acute cardiopulmonary abnormality. Electronically Signed   By: Genevie Ann M.D.   On: 01/10/2016 15:46   Dg Abd Acute W/chest  12/27/2015  CLINICAL DATA:  Upper abdominal pain for the past 4 days. History of atrial fibrillation. EXAM: DG ABDOMEN ACUTE W/ 1V CHEST COMPARISON:  12/17/2015; 12/16/2015 FINDINGS: Grossly unchanged enlarged cardiac silhouette and mediastinal contours given persistently reduced lung volumes. Minimal bilateral infrahilar opacities favored to represent atelectasis. Mild pulmonary venous congestion without frank evidence of edema. No pleural effusion or  pneumothorax. Mild gas distention of multiple loops of small bowel without definitive evidence of enteric obstruction. Unremarkable colonic stool burden. No pneumoperitoneum, pneumatosis or portal venous gas. Indeterminate punctate (approximately 5 mm) opacity overlying the right mid abdomen. Several phleboliths overlie the lower pelvis bilaterally. Mild to moderate scoliotic curvature of the thoracolumbar spine with dominant caudal component convex to the left. No acute osseous abnormalities. IMPRESSION: 1. Cardiomegaly and pulmonary venous congestion without definitive evidence of acute cardiopulmonary disease on this hypoventilated examination. 2. Nonspecific mild gaseous distention of several loops of small bowel without definite evidence of enteric obstruction. 3. Indeterminate punctate (approximately 5 mm) opacity overlies the right mid abdomen, while potentially radiopaque ingested debris, potentially renal stone could have a similar appearance. Clinical correlation is advised. Further evaluation with noncontrast abdominal CT could be performed as indicated. Electronically Signed   By: Sandi Mariscal M.D.   On: 12/27/2015 18:12   Dg Abd Acute W/chest  12/16/2015  CLINICAL DATA:  60 year old female with abdominal pain nausea vomiting and diarrhea for 4 days. Initial encounter. EXAM: DG ABDOMEN ACUTE W/ 1V CHEST COMPARISON:  CT Abdomen and Pelvis 11/14/2015 and earlier. FINDINGS: Upright AP view of the chest. Improved lung volumes and resolved left lung base effusion and opacity. No pneumothorax or pneumoperitoneum. Normal cardiac size and mediastinal contours. No new pulmonary opacity. Moderate gaseous distension of the stomach. Mild paucity of bowel gas elsewhere, and somewhat featureless appearance of gas-filled loops. There is gas throughout nondilated descending and proximal sigmoid colon. S-shaped scoliosis. No acute osseous abnormality identified. Stable cholecystectomy clips. IMPRESSION: 1. No abdominal  free air. Moderately distended stomach with nonobstructed bowel-gas pattern otherwise. The somewhat featureless appearance of bowel loops however might reflect ileus. 2. Resolved left pleural effusion and airspace disease since March. No acute cardiopulmonary abnormality. Electronically Signed   By: Genevie Ann M.D.   On: 12/16/2015 10:58   Dg Abd Portable 1v  12/28/2015  CLINICAL DATA:  Distended abdomen EXAM: PORTABLE ABDOMEN - 1 VIEW COMPARISON:  CT 12/27/2015 FINDINGS: Contrast in the right colon from prior CT. Gas in large and small bowel compatible with ileus. Negative for bowel obstruction Contrast in urinary bladder from recent CT scan. Lumbar levoscoliosis. IMPRESSION: Mild ileus. Nonobstructive bowel gas  pattern. Contrast in the right colon from CT yesterday. Electronically Signed   By: Franchot Gallo M.D.   On: 12/28/2015 09:37     Assessment and Plan   1. Elevated troponin/nonobstructive CAD/stress induced cardiomyopathy: -Cardiac cath on 5/4 showed no evidence of obstructive CAD. EF 40% with wall motion abnormality c/w stress-induced cardiomyopathy -Eliquis in place of aspirin  -Lopressor to 75 mg bid -Lipitor   2. Accelerated HTN: -Lopressor as above to 75 mg bid  -Improved -Continue remaining antihypertensives  3. Intractable N/V/D: -Per IM  4. PAF: -Currently in sinus rhythm -Eliquis was held for the above cardiac cath and she was placed on heparin gtt -Has been restarted on Eliquis -HGB stable as of 5/5 -Lopressor as above -CHADS2VASc at least 5 (HTN, diabetes, TIA/stroke x 2, female)  5. Acute respiratory failure: -Transient, lasting seconds with patient responding to sternal rub, breathing independently and with -Patient with narrow complex tachycardia at time of the event with HR of 153 -Resolved -CT head negative   Signed, Christell Faith, PA-C Pager: [phone] 01/13/2016, 10:04 AM

## 2016-01-15 ENCOUNTER — Emergency Department: Payer: Commercial Managed Care - HMO

## 2016-01-15 ENCOUNTER — Inpatient Hospital Stay
Admission: EM | Admit: 2016-01-15 | Discharge: 2016-01-19 | DRG: 326 | Disposition: A | Discharge status: Home Health | Payer: Commercial Managed Care - HMO | Attending: Internal Medicine | Admitting: Internal Medicine

## 2016-01-15 DIAGNOSIS — I5181 Takotsubo syndrome: Secondary | ICD-10-CM | POA: Diagnosis present

## 2016-01-15 DIAGNOSIS — K219 Gastro-esophageal reflux disease without esophagitis: Secondary | ICD-10-CM | POA: Diagnosis present

## 2016-01-15 DIAGNOSIS — I69354 Hemiplegia and hemiparesis following cerebral infarction affecting left non-dominant side: Secondary | ICD-10-CM

## 2016-01-15 DIAGNOSIS — Z9889 Other specified postprocedural states: Secondary | ICD-10-CM

## 2016-01-15 DIAGNOSIS — E1043 Type 1 diabetes mellitus with diabetic autonomic (poly)neuropathy: Secondary | ICD-10-CM | POA: Diagnosis present

## 2016-01-15 DIAGNOSIS — R112 Nausea with vomiting, unspecified: Secondary | ICD-10-CM | POA: Diagnosis not present

## 2016-01-15 DIAGNOSIS — I420 Dilated cardiomyopathy: Secondary | ICD-10-CM | POA: Diagnosis present

## 2016-01-15 DIAGNOSIS — K2970 Gastritis, unspecified, without bleeding: Principal | ICD-10-CM | POA: Diagnosis present

## 2016-01-15 DIAGNOSIS — Z9049 Acquired absence of other specified parts of digestive tract: Secondary | ICD-10-CM

## 2016-01-15 DIAGNOSIS — Z885 Allergy status to narcotic agent status: Secondary | ICD-10-CM

## 2016-01-15 DIAGNOSIS — R1111 Vomiting without nausea: Secondary | ICD-10-CM | POA: Diagnosis not present

## 2016-01-15 DIAGNOSIS — G8929 Other chronic pain: Secondary | ICD-10-CM | POA: Diagnosis present

## 2016-01-15 DIAGNOSIS — R197 Diarrhea, unspecified: Secondary | ICD-10-CM | POA: Insufficient documentation

## 2016-01-15 DIAGNOSIS — I2510 Atherosclerotic heart disease of native coronary artery without angina pectoris: Secondary | ICD-10-CM | POA: Diagnosis present

## 2016-01-15 DIAGNOSIS — K529 Noninfective gastroenteritis and colitis, unspecified: Secondary | ICD-10-CM | POA: Diagnosis present

## 2016-01-15 DIAGNOSIS — Z79891 Long term (current) use of opiate analgesic: Secondary | ICD-10-CM | POA: Diagnosis not present

## 2016-01-15 DIAGNOSIS — G43A0 Cyclical vomiting, not intractable: Secondary | ICD-10-CM | POA: Diagnosis not present

## 2016-01-15 DIAGNOSIS — K7460 Unspecified cirrhosis of liver: Secondary | ICD-10-CM | POA: Diagnosis present

## 2016-01-15 DIAGNOSIS — Z886 Allergy status to analgesic agent status: Secondary | ICD-10-CM

## 2016-01-15 DIAGNOSIS — I4901 Ventricular fibrillation: Secondary | ICD-10-CM | POA: Diagnosis not present

## 2016-01-15 DIAGNOSIS — Z888 Allergy status to other drugs, medicaments and biological substances status: Secondary | ICD-10-CM | POA: Diagnosis not present

## 2016-01-15 DIAGNOSIS — J811 Chronic pulmonary edema: Secondary | ICD-10-CM | POA: Diagnosis not present

## 2016-01-15 DIAGNOSIS — N200 Calculus of kidney: Secondary | ICD-10-CM | POA: Diagnosis not present

## 2016-01-15 DIAGNOSIS — I252 Old myocardial infarction: Secondary | ICD-10-CM

## 2016-01-15 DIAGNOSIS — R1112 Projectile vomiting: Secondary | ICD-10-CM

## 2016-01-15 DIAGNOSIS — Z66 Do not resuscitate: Secondary | ICD-10-CM | POA: Diagnosis present

## 2016-01-15 DIAGNOSIS — E861 Hypovolemia: Secondary | ICD-10-CM | POA: Diagnosis present

## 2016-01-15 DIAGNOSIS — K3184 Gastroparesis: Secondary | ICD-10-CM | POA: Diagnosis present

## 2016-01-15 DIAGNOSIS — Z794 Long term (current) use of insulin: Secondary | ICD-10-CM

## 2016-01-15 DIAGNOSIS — I495 Sick sinus syndrome: Secondary | ICD-10-CM | POA: Diagnosis present

## 2016-01-15 DIAGNOSIS — I119 Hypertensive heart disease without heart failure: Secondary | ICD-10-CM | POA: Diagnosis not present

## 2016-01-15 DIAGNOSIS — I480 Paroxysmal atrial fibrillation: Secondary | ICD-10-CM | POA: Diagnosis not present

## 2016-01-15 DIAGNOSIS — R1013 Epigastric pain: Secondary | ICD-10-CM | POA: Diagnosis not present

## 2016-01-15 DIAGNOSIS — Z8542 Personal history of malignant neoplasm of other parts of uterus: Secondary | ICD-10-CM | POA: Diagnosis not present

## 2016-01-15 DIAGNOSIS — E119 Type 2 diabetes mellitus without complications: Secondary | ICD-10-CM | POA: Diagnosis not present

## 2016-01-15 DIAGNOSIS — R110 Nausea: Secondary | ICD-10-CM | POA: Diagnosis not present

## 2016-01-15 DIAGNOSIS — K5730 Diverticulosis of large intestine without perforation or abscess without bleeding: Secondary | ICD-10-CM | POA: Diagnosis present

## 2016-01-15 DIAGNOSIS — R109 Unspecified abdominal pain: Secondary | ICD-10-CM | POA: Diagnosis not present

## 2016-01-15 DIAGNOSIS — E039 Hypothyroidism, unspecified: Secondary | ICD-10-CM | POA: Diagnosis present

## 2016-01-15 DIAGNOSIS — E8809 Other disorders of plasma-protein metabolism, not elsewhere classified: Secondary | ICD-10-CM | POA: Diagnosis present

## 2016-01-15 DIAGNOSIS — R1084 Generalized abdominal pain: Secondary | ICD-10-CM | POA: Diagnosis not present

## 2016-01-15 DIAGNOSIS — I959 Hypotension, unspecified: Secondary | ICD-10-CM | POA: Diagnosis not present

## 2016-01-15 DIAGNOSIS — Z79899 Other long term (current) drug therapy: Secondary | ICD-10-CM

## 2016-01-15 DIAGNOSIS — E876 Hypokalemia: Secondary | ICD-10-CM | POA: Diagnosis present

## 2016-01-15 DIAGNOSIS — K3189 Other diseases of stomach and duodenum: Secondary | ICD-10-CM | POA: Diagnosis not present

## 2016-01-15 DIAGNOSIS — R933 Abnormal findings on diagnostic imaging of other parts of digestive tract: Secondary | ICD-10-CM | POA: Diagnosis not present

## 2016-01-15 DIAGNOSIS — T182XXA Foreign body in stomach, initial encounter: Secondary | ICD-10-CM | POA: Diagnosis not present

## 2016-01-15 DIAGNOSIS — Z7901 Long term (current) use of anticoagulants: Secondary | ICD-10-CM

## 2016-01-15 DIAGNOSIS — Z8249 Family history of ischemic heart disease and other diseases of the circulatory system: Secondary | ICD-10-CM | POA: Diagnosis not present

## 2016-01-15 DIAGNOSIS — K2950 Unspecified chronic gastritis without bleeding: Secondary | ICD-10-CM | POA: Diagnosis not present

## 2016-01-15 DIAGNOSIS — Z90710 Acquired absence of both cervix and uterus: Secondary | ICD-10-CM | POA: Diagnosis not present

## 2016-01-15 DIAGNOSIS — R198 Other specified symptoms and signs involving the digestive system and abdomen: Secondary | ICD-10-CM | POA: Insufficient documentation

## 2016-01-15 DIAGNOSIS — Z8711 Personal history of peptic ulcer disease: Secondary | ICD-10-CM

## 2016-01-15 DIAGNOSIS — R1110 Vomiting, unspecified: Secondary | ICD-10-CM | POA: Diagnosis not present

## 2016-01-15 DIAGNOSIS — K5790 Diverticulosis of intestine, part unspecified, without perforation or abscess without bleeding: Secondary | ICD-10-CM | POA: Diagnosis not present

## 2016-01-15 DIAGNOSIS — T18108A Unspecified foreign body in esophagus causing other injury, initial encounter: Secondary | ICD-10-CM | POA: Diagnosis not present

## 2016-01-15 HISTORY — DX: Gastroparesis: K31.84

## 2016-01-15 HISTORY — DX: Sick sinus syndrome: I49.5

## 2016-01-15 LAB — CULTURE, BLOOD (ROUTINE X 2)
CULTURE: NO GROWTH
Culture: NO GROWTH

## 2016-01-15 LAB — CBC
HEMATOCRIT: 29.3 % — AB (ref 35.0–47.0)
HEMOGLOBIN: 9.7 g/dL — AB (ref 12.0–16.0)
MCH: 27.9 pg (ref 26.0–34.0)
MCHC: 33.1 g/dL (ref 32.0–36.0)
MCV: 84.1 fL (ref 80.0–100.0)
Platelets: 138 10*3/uL — ABNORMAL LOW (ref 150–440)
RBC: 3.48 MIL/uL — ABNORMAL LOW (ref 3.80–5.20)
RDW: 15.9 % — ABNORMAL HIGH (ref 11.5–14.5)
WBC: 6.1 10*3/uL (ref 3.6–11.0)

## 2016-01-15 LAB — COMPREHENSIVE METABOLIC PANEL
ALBUMIN: 3.2 g/dL — AB (ref 3.5–5.0)
ALT: 22 U/L (ref 14–54)
ANION GAP: 8 (ref 5–15)
AST: 27 U/L (ref 15–41)
Alkaline Phosphatase: 86 U/L (ref 38–126)
BILIRUBIN TOTAL: 1.2 mg/dL (ref 0.3–1.2)
BUN: 13 mg/dL (ref 6–20)
CHLORIDE: 106 mmol/L (ref 101–111)
CO2: 23 mmol/L (ref 22–32)
Calcium: 8.6 mg/dL — ABNORMAL LOW (ref 8.9–10.3)
Creatinine, Ser: 0.63 mg/dL (ref 0.44–1.00)
GFR calc Af Amer: >60 mL/min (ref >60)
GFR calc non Af Amer: >60 mL/min (ref >60)
GLUCOSE: 271 mg/dL — AB (ref 65–99)
POTASSIUM: 3.2 mmol/L — AB (ref 3.5–5.1)
Sodium: 137 mmol/L (ref 135–145)
TOTAL PROTEIN: 5.9 g/dL — AB (ref 6.5–8.1)

## 2016-01-15 LAB — URINALYSIS COMPLETE WITH MICROSCOPIC (ARMC ONLY)
BACTERIA UA: NONE SEEN
Bilirubin Urine: NEGATIVE
Glucose, UA: >500 mg/dL — AB
LEUKOCYTES UA: NEGATIVE
NITRITE: NEGATIVE
PROTEIN: 30 mg/dL — AB
SPECIFIC GRAVITY, URINE: 1.033 — AB (ref 1.005–1.030)
pH: 8 (ref 5.0–8.0)

## 2016-01-15 LAB — GLUCOSE, CAPILLARY: Glucose-Capillary: 378 mg/dL — ABNORMAL HIGH (ref 65–99)

## 2016-01-15 LAB — LACTIC ACID, PLASMA: LACTIC ACID, VENOUS: 1.8 mmol/L (ref 0.5–2.0)

## 2016-01-15 LAB — TSH: TSH: 12.431 u[IU]/mL — AB (ref 0.350–4.500)

## 2016-01-15 LAB — T4, FREE: FREE T4: 0.82 ng/dL (ref 0.61–1.12)

## 2016-01-15 LAB — LIPASE, BLOOD: LIPASE: 19 U/L (ref 11–51)

## 2016-01-15 LAB — TROPONIN I: TROPONIN I: 0.05 ng/mL — AB (ref <0.031)

## 2016-01-15 MED ORDER — SODIUM CHLORIDE 0.9 % IV SOLN: 3.0000 g | Freq: Once | INTRAVENOUS | Status: DC

## 2016-01-15 MED ORDER — DICYCLOMINE HCL 10 MG PO CAPS
10.0000 mg | ORAL_CAPSULE | Freq: Three times a day (TID) | ORAL | Status: DC
  Administered 2016-01-15 – 2016-01-19 (×12): 10 mg via ORAL
  Filled 2016-01-15 (×17): qty 1

## 2016-01-15 MED ORDER — LEVOTHYROXINE SODIUM 75 MCG PO TABS
75.0000 ug | ORAL_TABLET | Freq: Every day | ORAL | Status: DC
  Administered 2016-01-16 – 2016-01-19 (×3): 75 ug via ORAL
  Filled 2016-01-15 (×3): qty 1

## 2016-01-15 MED ORDER — IOPAMIDOL (ISOVUE-300) INJECTION 61%
100.0000 mL | Freq: Once | INTRAVENOUS | Status: AC | PRN
  Administered 2016-01-15: 100 mL via INTRAVENOUS

## 2016-01-15 MED ORDER — METOCLOPRAMIDE HCL 5 MG/ML IJ SOLN
5.0000 mg | Freq: Three times a day (TID) | INTRAMUSCULAR | Status: DC
  Administered 2016-01-15 – 2016-01-19 (×12): 5 mg via INTRAVENOUS
  Filled 2016-01-15 (×12): qty 2

## 2016-01-15 MED ORDER — METRONIDAZOLE IN NACL 5-0.79 MG/ML-% IV SOLN: 500.0000 mg | Freq: Once | INTRAVENOUS | Status: DC

## 2016-01-15 MED ORDER — ONDANSETRON HCL 4 MG/2ML IJ SOLN
4.0000 mg | Freq: Once | INTRAMUSCULAR | Status: AC
  Administered 2016-01-15: 4 mg via INTRAVENOUS
  Filled 2016-01-15: qty 2

## 2016-01-15 MED ORDER — ENOXAPARIN SODIUM 40 MG/0.4ML ~~LOC~~ SOLN
40.0000 mg | SUBCUTANEOUS | Status: DC
  Filled 2016-01-15: qty 0.4

## 2016-01-15 MED ORDER — ONDANSETRON HCL 4 MG PO TABS: 4.0000 mg | ORAL_TABLET | Freq: Four times a day (QID) | ORAL | Status: DC | PRN

## 2016-01-15 MED ORDER — OXYCODONE HCL 5 MG PO TABS
5.0000 mg | ORAL_TABLET | Freq: Four times a day (QID) | ORAL | Status: DC | PRN
  Administered 2016-01-15 – 2016-01-18 (×5): 10 mg via ORAL
  Filled 2016-01-15 (×5): qty 2

## 2016-01-15 MED ORDER — SODIUM CHLORIDE 0.9 % IV BOLUS (SEPSIS)
500.0000 mL | Freq: Once | INTRAVENOUS | Status: AC
  Administered 2016-01-15: 500 mL via INTRAVENOUS

## 2016-01-15 MED ORDER — RIFAXIMIN 550 MG PO TABS
550.0000 mg | ORAL_TABLET | Freq: Two times a day (BID) | ORAL | Status: DC
  Administered 2016-01-15 – 2016-01-19 (×7): 550 mg via ORAL
  Filled 2016-01-15 (×7): qty 1

## 2016-01-15 MED ORDER — PIPERACILLIN-TAZOBACTAM 3.375 G IVPB
3.3750 g | Freq: Once | INTRAVENOUS | Status: AC
  Administered 2016-01-15: 3.375 g via INTRAVENOUS
  Filled 2016-01-15: qty 50

## 2016-01-15 MED ORDER — METRONIDAZOLE IN NACL 5-0.79 MG/ML-% IV SOLN
500.0000 mg | Freq: Three times a day (TID) | INTRAVENOUS | Status: DC
  Administered 2016-01-15 – 2016-01-18 (×9): 500 mg via INTRAVENOUS
  Filled 2016-01-15 (×12): qty 100

## 2016-01-15 MED ORDER — MAGNESIUM OXIDE 400 (241.3 MG) MG PO TABS
400.0000 mg | ORAL_TABLET | Freq: Two times a day (BID) | ORAL | Status: DC
  Administered 2016-01-15 – 2016-01-19 (×7): 400 mg via ORAL
  Filled 2016-01-15 (×8): qty 1

## 2016-01-15 MED ORDER — ALBUTEROL SULFATE HFA 108 (90 BASE) MCG/ACT IN AERS: 2.0000 | INHALATION_SPRAY | Freq: Four times a day (QID) | RESPIRATORY_TRACT | Status: DC | PRN

## 2016-01-15 MED ORDER — BENZONATATE 100 MG PO CAPS: 200.0000 mg | ORAL_CAPSULE | Freq: Three times a day (TID) | ORAL | Status: DC | PRN

## 2016-01-15 MED ORDER — SODIUM CHLORIDE 0.9% FLUSH
3.0000 mL | Freq: Two times a day (BID) | INTRAVENOUS | Status: DC
  Administered 2016-01-15 – 2016-01-19 (×4): 3 mL via INTRAVENOUS

## 2016-01-15 MED ORDER — ONDANSETRON HCL 4 MG/2ML IJ SOLN
4.0000 mg | Freq: Four times a day (QID) | INTRAMUSCULAR | Status: DC | PRN
  Administered 2016-01-15 – 2016-01-17 (×4): 4 mg via INTRAVENOUS
  Filled 2016-01-15 (×4): qty 2

## 2016-01-15 MED ORDER — SODIUM CHLORIDE 0.9 % IV BOLUS (SEPSIS)
1000.0000 mL | Freq: Once | INTRAVENOUS | Status: AC
  Administered 2016-01-15: 1000 mL via INTRAVENOUS

## 2016-01-15 MED ORDER — ALBUTEROL SULFATE (2.5 MG/3ML) 0.083% IN NEBU: 2.5000 mg | INHALATION_SOLUTION | Freq: Four times a day (QID) | RESPIRATORY_TRACT | Status: DC | PRN

## 2016-01-15 MED ORDER — TIZANIDINE HCL 4 MG PO TABS: 4.0000 mg | ORAL_TABLET | Freq: Three times a day (TID) | ORAL | Status: DC | PRN

## 2016-01-15 MED ORDER — MOMETASONE FURO-FORMOTEROL FUM 200-5 MCG/ACT IN AERO
2.0000 | INHALATION_SPRAY | Freq: Two times a day (BID) | RESPIRATORY_TRACT | Status: DC
  Administered 2016-01-15 – 2016-01-19 (×8): 2 via RESPIRATORY_TRACT
  Filled 2016-01-15: qty 8.8

## 2016-01-15 MED ORDER — INSULIN DETEMIR 100 UNIT/ML ~~LOC~~ SOLN
10.0000 [IU] | Freq: Every day | SUBCUTANEOUS | Status: DC
  Administered 2016-01-15 – 2016-01-18 (×4): 10 [IU] via SUBCUTANEOUS
  Filled 2016-01-15 (×5): qty 0.1

## 2016-01-15 MED ORDER — ENOXAPARIN SODIUM 40 MG/0.4ML ~~LOC~~ SOLN
40.0000 mg | SUBCUTANEOUS | Status: DC
  Administered 2016-01-15 – 2016-01-17 (×3): 40 mg via SUBCUTANEOUS
  Filled 2016-01-15 (×3): qty 0.4

## 2016-01-15 MED ORDER — PIPERACILLIN-TAZOBACTAM 3.375 G IVPB
3.3750 g | Freq: Three times a day (TID) | INTRAVENOUS | Status: DC
  Administered 2016-01-15 – 2016-01-19 (×11): 3.375 g via INTRAVENOUS
  Filled 2016-01-15 (×13): qty 50

## 2016-01-15 MED ORDER — MORPHINE SULFATE (PF) 4 MG/ML IV SOLN
4.0000 mg | Freq: Once | INTRAVENOUS | Status: AC
  Administered 2016-01-15: 4 mg via INTRAVENOUS
  Filled 2016-01-15: qty 1

## 2016-01-15 MED ORDER — DIATRIZOATE MEGLUMINE & SODIUM 66-10 % PO SOLN
15.0000 mL | Freq: Once | ORAL | Status: AC
  Administered 2016-01-15: 15 mL via ORAL

## 2016-01-15 MED ORDER — PANTOPRAZOLE SODIUM 40 MG PO TBEC
40.0000 mg | DELAYED_RELEASE_TABLET | Freq: Two times a day (BID) | ORAL | Status: DC
  Administered 2016-01-15 – 2016-01-19 (×6): 40 mg via ORAL
  Filled 2016-01-15 (×7): qty 1

## 2016-01-15 MED ORDER — MORPHINE SULFATE ER 15 MG PO TBCR
15.0000 mg | EXTENDED_RELEASE_TABLET | Freq: Three times a day (TID) | ORAL | Status: DC
  Administered 2016-01-15 – 2016-01-19 (×11): 15 mg via ORAL
  Filled 2016-01-15 (×11): qty 1

## 2016-01-15 MED ORDER — VANCOMYCIN HCL IN DEXTROSE 1-5 GM/200ML-% IV SOLN
1000.0000 mg | Freq: Once | INTRAVENOUS | Status: AC
  Administered 2016-01-15: 1000 mg via INTRAVENOUS
  Filled 2016-01-15: qty 200

## 2016-01-15 MED ORDER — DEXAMETHASONE SODIUM PHOSPHATE 10 MG/ML IJ SOLN
4.0000 mg | Freq: Once | INTRAMUSCULAR | Status: AC
  Administered 2016-01-15: 4 mg via INTRAVENOUS
  Filled 2016-01-15: qty 1

## 2016-01-15 MED ORDER — MORPHINE SULFATE (PF) 2 MG/ML IV SOLN: 1.0000 mg | INTRAVENOUS | Status: DC | PRN

## 2016-01-15 MED ORDER — SODIUM CHLORIDE 0.9 % IV SOLN
INTRAVENOUS | Status: DC
  Administered 2016-01-15 – 2016-01-19 (×7): via INTRAVENOUS

## 2016-01-15 NOTE — ED Provider Notes (Signed)
[facility] Emergency Department Provider Note   ____________________________________________  Time seen: Approximately 8:47 AM  I have reviewed the triage vital signs and the nursing notes.   HISTORY  Chief Complaint Abdominal Pain and Nausea    HPI [name] is a 60 y.o. female with history of type 1 diabetes, chronic left arm and left leg weakness, H of fibrillation, hypertension and diabetes, nausea and vomiting related to gastroparesis who presents for evaluation of 2 days of recurrent nonbloody nonbilious emesis, nonbloody diarrhea and diffuse abdominal pain which she reports feels similar to her prior gastroparesis flares, constant since onset, no modifying factors. She was just discharged from [facility] on 01/13/2016 after being treated for an tractable nausea and vomiting, CT scan of her abdomen and pelvis was reassuring. During her stay in the hospital, she had also complained of chest pain and had an elevated troponin, she had a catheterization performed which showed a regular coronaries but no significant/obstructing disease. She went home on the fifth and was feeling a little bit better however that evening again began having nausea and vomiting. She denies any chest pain or shortness of breath at this time, no fevers, no dysuria.   Past Medical History  Diagnosis Date  . Type 1 diabetes ([place])   . Hypothyroid   . Degenerative disk disease   . Stomach ulcer   . Diverticulitis   . Syncope 01/2015  . Anxiety   . GERD (gastroesophageal reflux disease)   . History of hiatal hernia   . Cancer (HCC)     HX OF CANCER OF UTERUS   . TIA (transient ischemic attack) 02/2015  . Hypertension   . Pancreatitis   . PAF (paroxysmal atrial fibrillation) ([place]) 03/2015    a. new onset 03/2015 in setting of intractable N/V; b. on Eliquis 5 mg bid; c. CHADSVASc 4 (DM, TIA x 2, female)  . Intussusception intestine ([place]) 05/2015  . Stroke ([place])   . Allergy   .  Cirrhosis of liver not due to alcohol ([place]) 2016  . Gastroparesis     Patient Active Problem List   Diagnosis Date Noted  . NSTEMI (non-ST elevated myocardial infarction) ([place]) 01/11/2016  . Tachyarrhythmia 01/10/2016  . Intractable nausea and vomiting 01/10/2016  . Tachy-brady syndrome ([place]) 12/28/2015  . Abdominal pain 12/27/2015  . Colitis 12/16/2015  . Pneumonia 11/14/2015  . Narcotic withdrawal ([place]) 11/11/2015  . Diabetes mellitus type 2, insulin dependent ([name]) 11/11/2015  . Orthostatic hypotension   . H/O TIA (transient ischemic attack) and stroke   . Left-sided weakness 10/04/2015  . Expressive aphasia 10/04/2015  . Ileus ([place]) 08/01/2015  . Ascites   . Cryptogenic cirrhosis ([place]) 07/10/2015  . Paroxysmal atrial fibrillation ([place]) 07/10/2015  . C. difficile colitis 07/10/2015  . GI (gastrointestinal bleed) 07/06/2015  . Malnutrition of moderate degree 07/04/2015  . Symptomatic bradycardia 05/27/2015  . Intussusception intestine ([place])   . Hypothyroidism due to amiodarone   . Abdominal pain, chronic, epigastric   . Chronic anemia 05/19/2015  . Thrombocytopenia ([place]) 05/19/2015  . Prolonged QT interval   . Hypomagnesemia   . Narcotic abuse   . Type 2 diabetes mellitus without complication ([place])   . Syncope due to orthostatic hypotension 05/18/2015  . Hypokalemia 04/06/2015  . Hyperlipidemia with target LDL less than 100 04/06/2015  . CVA (cerebral infarction) 02/15/2015  . Essential hypertension 01/12/2015  . Chronically on opiate therapy 01/12/2015  . Gastroparesis 01/12/2015  . DEPRESSION/ANXIETY 06/27/2007  .  MYOFASCIAL PAIN SYNDROME 06/27/2007  . Chronic pain syndrome 03/28/2007  . GERD 03/27/2007  . DIVERTICULOSIS, COLON 03/27/2007  . LUMBAR DISC DISPLACEMENT 03/27/2007  . PROTEINURIA 03/27/2007  . UTERINE CANCER, HX OF 03/27/2007    Past Surgical History  Procedure Laterality Date  . Hernia repair    . Abdominal hysterectomy    . Cholecystectomy      . Esophagogastroduodenoscopy N/A 04/04/2015    Procedure: ESOPHAGOGASTRODUODENOSCOPY (EGD);  Surgeon: Hulen Luster, MD;  Location: [facility] ENDOSCOPY;  Service: Endoscopy;  Laterality: N/A;  . Cardiac catheterization N/A 01/12/2016    Procedure: Left Heart Cath and Coronary Angiography;  Surgeon: Wellington Hampshire, MD;  Location: [place] CV LAB;  Service: Cardiovascular;  Laterality: N/A;    Current Outpatient Rx  Name  Route  Sig  Dispense  Refill  . albuterol (PROVENTIL HFA;VENTOLIN HFA) 108 (90 Base) MCG/ACT inhaler   Inhalation   Inhale 2 puffs into the lungs every 6 (six) hours as needed for wheezing or shortness of breath.   1 Inhaler   2   . amLODipine (NORVASC) 5 MG tablet   Oral   Take 5 mg by mouth daily.         Marland Kitchen apixaban (ELIQUIS) 5 MG TABS tablet   Oral   Take 1 tablet (5 mg total) by mouth 2 (two) times daily.   60 tablet   0   . atorvastatin (LIPITOR) 40 MG tablet   Oral   Take 40 mg by mouth at bedtime.         . benzonatate (TESSALON) 200 MG capsule   Oral   Take 1 capsule (200 mg total) by mouth 3 (three) times daily as needed for cough.   20 capsule   0   . budesonide-formoterol (SYMBICORT) 160-4.5 MCG/ACT inhaler   Inhalation   Inhale 2 puffs into the lungs 2 (two) times daily.   1 Inhaler   12   . citalopram (CELEXA) 20 MG tablet   Oral   Take 20 mg by mouth daily.         Marland Kitchen dicyclomine (BENTYL) 10 MG capsule   Oral   Take 10 mg by mouth 4 (four) times daily -  before meals and at bedtime.          . insulin aspart (NOVOLOG) 100 UNIT/ML injection   Subcutaneous   Inject into the skin 3 (three) times daily with meals as needed for high blood sugar. Pt uses as needed per sliding scale.         . insulin detemir (LEVEMIR) 100 UNIT/ML injection   Subcutaneous   Inject 17 Units into the skin at bedtime.         Marland Kitchen levothyroxine (SYNTHROID, LEVOTHROID) 75 MCG tablet   Oral   Take 1 tablet (75 mcg total) by mouth daily before  breakfast.   30 tablet   1   . magnesium oxide (MAG-OX) 400 (241.3 Mg) MG tablet   Oral   Take 1 tablet (400 mg total) by mouth 2 (two) times daily.   10 tablet   0   . metoCLOPramide (REGLAN) 5 MG tablet   Oral   Take 1 tablet (5 mg total) by mouth 4 (four) times daily -  before meals and at bedtime.   90 tablet   0   . metoprolol tartrate (LOPRESSOR) 25 MG tablet   Oral   Take 25 mg by mouth 2 (two) times daily.          Marland Kitchen  morphine (MS CONTIN) 15 MG 12 hr tablet   Oral   Take 1 tablet (15 mg total) by mouth every 8 (eight) hours.   75 tablet   0   . oxyCODONE (OXY IR/ROXICODONE) 5 MG immediate release tablet   Oral   Take 5-10 mg by mouth every 6 (six) hours as needed for severe pain.         . pantoprazole (PROTONIX) 40 MG tablet   Oral   Take 40 mg by mouth 2 (two) times daily before a meal.         . rifaximin (XIFAXAN) 550 MG TABS tablet   Oral   Take 1 tablet (550 mg total) by mouth 2 (two) times daily.   60 tablet   6   . tiZANidine (ZANAFLEX) 4 MG tablet   Oral   Take 4 mg by mouth 3 (three) times daily as needed for muscle spasms.            Allergies Hydrocodone; Erythromycin; Prednisone; Rosiglitazone maleate; Codeine sulfate; and Tetanus-diphtheria toxoids td  Family History  Problem Relation Age of Onset  . Hypertension Mother   . CAD Sister   . Heart attack Sister     Deceased 11-26-14  . CAD Brother     Social History Social History  Substance Use Topics  . Smoking status: Never Smoker   . Smokeless tobacco: Never Used  . Alcohol Use: No    Review of Systems Constitutional: No fever/chills Eyes: No visual changes. ENT: No sore throat. Cardiovascular: Denies chest pain. Respiratory: Denies shortness of breath. Gastrointestinal: + abdominal pain.  + nausea, + vomiting.  + diarrhea.  No constipation. Genitourinary: Negative for dysuria. Musculoskeletal: Negative for back pain. Skin: Negative for rash. Neurological:  Negative for headaches, focal weakness or numbness.  10-point ROS otherwise negative.  ____________________________________________   PHYSICAL EXAM:  VITAL SIGNS: ED Triage Vitals  Enc Vitals Group     BP 01/15/16 0839 141/71 mmHg     Pulse Rate 01/15/16 0839 113     Resp 01/15/16 0839 14     Temp 01/15/16 0839 98.5 F (36.9 C)     Temp Source 01/15/16 0839 Oral     SpO2 01/15/16 0839 99 %     Weight 01/15/16 0839 130 lb (58.968 kg)     Height 01/15/16 0839 5' 3"  (1.6 m)     Head Cir --      Peak Flow --      Pain Score 01/15/16 0841 10     Pain Loc --      Pain Edu? --      Excl. in [place]? --     Constitutional: Alert and oriented. Nontoxic-appearing and in no acute distress. Eyes: Conjunctivae are normal. PERRL. EOMI. Head: Atraumatic. Nose: No congestion/rhinnorhea. Mouth/Throat: Mucous membranes are moist.  Oropharynx non-erythematous. Neck: No stridor. Supple without meningismus. Cardiovascular: Tachycardic rate, regular rhythm. Grossly normal heart sounds.  Good peripheral circulation. Respiratory: Normal respiratory effort.  No retractions. Lungs CTAB. Gastrointestinal: Soft with moderate diffuse abdominal tenderness to palpation. No CVA tenderness. Genitourinary: deferred Musculoskeletal: No lower extremity tenderness nor edema.  No joint effusions. Right groin with small needle puncture wound, no hematoma, no pulsatile mass, no stranding erythema or tenderness. Neurologic:  Normal speech and language. Chronic left arm and leg weakness which is unchanged from baseline according to the patient. Skin:  Skin is warm, dry and intact. No rash noted. Psychiatric: Mood and affect are normal. Speech and behavior  are normal.  ____________________________________________   LABS (all labs ordered are listed, but only abnormal results are displayed)  Labs Reviewed  COMPREHENSIVE METABOLIC PANEL - Abnormal; Notable for the following:    Potassium 3.2 (*)    Glucose, Bld  271 (*)    Calcium 8.6 (*)    Total Protein 5.9 (*)    Albumin 3.2 (*)    All other components within normal limits  CBC - Abnormal; Notable for the following:    RBC 3.48 (*)    Hemoglobin 9.7 (*)    HCT 29.3 (*)    RDW 15.9 (*)    Platelets 138 (*)    All other components within normal limits  TROPONIN I - Abnormal; Notable for the following:    Troponin I 0.05 (*)    All other components within normal limits  TSH - Abnormal; Notable for the following:    TSH 12.431 (*)    All other components within normal limits  CULTURE, BLOOD (ROUTINE X 2)  CULTURE, BLOOD (ROUTINE X 2)  CULTURE, BLOOD (ROUTINE X 2)  CULTURE, BLOOD (ROUTINE X 2)  LIPASE, BLOOD  LACTIC ACID, PLASMA  T4, FREE  URINALYSIS COMPLETEWITH MICROSCOPIC ([facility] ONLY)  LACTIC ACID, PLASMA   ____________________________________________  EKG  ED ECG REPORT I, Joanne Gavel, the attending physician, personally viewed and interpreted this ECG.   Date: 01/15/2016  EKG Time: 08:44  Rate: 109  Rhythm: sinus tachycardia  Axis: normal  Intervals:none  ST&T Change: No acute ST elevation. Q waves anteriorly.  ____________________________________________  RADIOLOGY  CT abdomen and pelvis IMPRESSION: Cirrhotic liver with ascites.  Nonobstructing renal calculi.  Diffuse colonic wall thickening from cecum through descending colon compatible with colitis ; differential diagnosis includes infection, inflammatory bowel disease, ischemia much less likely.  ____________________________________________   PROCEDURES  Procedure(s) performed: None  Critical Care performed: Yes, see critical care note(s). Total critical care time spent 35 minutes.  ____________________________________________   INITIAL IMPRESSION / ASSESSMENT AND PLAN / ED COURSE  Pertinent labs & imaging results that were available during my care of the patient were reviewed by me and considered in my medical decision making (see chart for  details).  [name] is a 60 y.o. female with history of type 1 diabetes, chronic left arm and left leg weakness, H of fibrillation, hypertension and diabetes, nausea and vomiting related to gastroparesis who presents for evaluation of 2 days of recurrent nonbloody nonbilious emesis, nonbloody diarrhea and diffuse abdominal pain which she reports feels similar to her prior gastroparesis flares. On exam she is nontoxic appearing and in no acute distress, mildly tachycardic but the remainder of her vital signs are stable, she is afebrile. She does have tenderness to palpation throughout the abdomen but there is no rigidity, no rebound, no guarding. We'll obtain screening labs, treat her symptomatically with IV fluids, morphine, Zofran and reassess for disposition as well as need for advanced imaging.  ----------------------------------------- 10:44 AM on 01/15/2016 -----------------------------------------  Patient is now persistent hypotensive with blood pressure of 76/47. CBC is still. Well however given new onset hypotension, will start with aggressive IV fluid resuscitation, we'll give vancomycin and zosyn antibiotics empirically a technically she is not meeting SIRS criteria apart from her mild initial tachycardia, we'll give stress-dosed steroids and anticipate admission. She remains awake, alert, oriented and in no acute distress.  ----------------------------------------- 11:24 AM on 01/15/2016 -----------------------------------------  Blood pressure improving to 86/53 at this time. MAP 63.  ----------------------------------------- 11:41 AM on 01/15/2016 -----------------------------------------  Blood pressure 107/66, map 79. Patient continues to appear well.  ----------------------------------------- 1:05 PM on 01/15/2016 -----------------------------------------  Blood pressure improving to 104/63 at this time. CT scan of the abdomen and pelvis is concerning for colitis.  Case discussed with hospitalist for admission at this time. ____________________________________________   FINAL CLINICAL IMPRESSION(S) / ED DIAGNOSES  Final diagnoses:  Colitis  Hypotension, unspecified hypotension type  Generalized abdominal pain      NEW MEDICATIONS STARTED DURING THIS VISIT:  New Prescriptions   No medications on file     Note:  This document was prepared using Dragon voice recognition software and may include unintentional dictation errors.    Joanne Gavel, MD 01/15/16 1314

## 2016-01-15 NOTE — H&P (Signed)
[facility] [place] NAME: [name]    MR#:  [id number]  DATE OF BIRTH:  [birth year]  DATE OF ADMISSION:  01/15/2016  PRIMARY CARE PHYSICIAN: Maryland Pink, MD   REQUESTING/REFERRING PHYSICIAN: Dr. Loura Pardon  CHIEF COMPLAINT:   Chief Complaint  Patient presents with  . Abdominal Pain  . Nausea    HISTORY OF PRESENT ILLNESS:  [name]  is a 60 y.o. female with a known history of multiple medical problems including insulin-dependent diabetes mellitus, chronic abdominal pain, gastroparesis, chronic anemia, paroxysmal atrial fibrillation on eliquis, Tachy Brady syndrome, nonalcoholic liver cirrhosis, prior CVA with minimal left-sided weakness presents to the hospital 3 days after recent discharge her to worsening nausea, vomiting and abdominal pain. Her last hospitalization last week was secondary to arrhythmia, elevated troponin and had a clean cardiac catheter. She had a transient episode of unresponsiveness for which a CODE BLUE was called and she was immediately resuscitated without any defects. Did not require intubation at the time. Patient was discharged in a stable condition on 01/13/2016. She says she went home fine. Did not eat anything after discharge until late that night. As soon as she started eating, she threw up. All day yesterday she rested. But her nausea and vomiting gotten worse since last night again. Also associated with abdominal pain before the nausea. Sharp mid abdominal pain radiating all over the abdomen. Not radiating to the back. She has gastroparesis and supposed to be taking Reglan at home. She is also complaining of diarrhea. This is her third hospitalization for colitis in the last 6 weeks. CT of the abdomen here reveals diffuse colitis. She has been on Zosyn, Augmentin and Flagyl in the past. Blood pressure has been persistently low in the emergency room requiring IV fluid boluses. She is not symptomatic  at this time from her hypertension. Lactic acid is within normal limits.  PAST MEDICAL HISTORY:   Past Medical History  Diagnosis Date  . Type 1 diabetes ([place])     on levemir  . Hypothyroid   . Degenerative disk disease   . Stomach ulcer   . Diverticulitis   . Syncope 01/2015  . Anxiety   . GERD (gastroesophageal reflux disease)   . History of hiatal hernia   . Cancer (H[facility])     HX OF CANCER OF UTERUS   . TIA (transient ischemic attack) 02/2015  . Hypertension   . Pancreatitis   . PAF (paroxysmal atrial fibrillation) ([place]) 03/2015    a. new onset 03/2015 in setting of intractable N/V; b. on Eliquis 5 mg bid; c. CHADSVASc 4 (DM, TIA x 2, female)  . Intussusception intestine ([place]) 05/2015  . Stroke [facility])     with minimal left sided weakness  . Allergy   . Cirrhosis of liver not due to alcohol ([place]) 2016  . Gastroparesis   . Sick sinus syndrome ([place])     PAST SURGICAL HISTORY:   Past Surgical History  Procedure Laterality Date  . Hernia repair    . Abdominal hysterectomy    . Cholecystectomy    . Esophagogastroduodenoscopy N/A 04/04/2015    Procedure: ESOPHAGOGASTRODUODENOSCOPY (EGD);  Surgeon: Hulen Luster, MD;  [facility] ENDOSCOPY;  Service: Endoscopy;  Laterality: N/A;  . Cardiac catheterization N/A 01/12/2016    Procedure: Left Heart Cath and Coronary Angiography;  Surgeon: Wellington Hampshire, MD;  Location: [place] CV LAB;  Service: Cardiovascular;  Laterality: N/A;    SOCIAL  HISTORY:   Social History  Substance Use Topics  . Smoking status: Never Smoker   . Smokeless tobacco: Never Used  . Alcohol Use: No    FAMILY HISTORY:   Family History  Problem Relation Age of Onset  . Hypertension Mother   . CAD Sister   . Heart attack Sister     Deceased 2014-11-16  . CAD Brother     DRUG ALLERGIES:   Allergies  Allergen Reactions  . Hydrocodone Other (See Comments)    Pt states that this medication caused cirrhosis of the liver.    . Erythromycin Other (See  Comments)    Reaction:  Fever   . Prednisone Other (See Comments)    Reaction:  Unknown   . Rosiglitazone Maleate Swelling  . Codeine Sulfate Rash  . Tetanus-Diphtheria Toxoids Td Rash and Other (See Comments)    Reaction:  Fever     REVIEW OF SYSTEMS:   Review of Systems  Constitutional: Positive for malaise/fatigue. Negative for fever, chills and weight loss.  HENT: Negative for ear discharge, ear pain, nosebleeds and tinnitus.   Eyes: Negative for blurred vision, double vision and photophobia.  Respiratory: Positive for cough. Negative for hemoptysis, shortness of breath and wheezing.   Cardiovascular: Negative for chest pain, palpitations, orthopnea and leg swelling.  Gastrointestinal: Positive for nausea, vomiting, abdominal pain and diarrhea. Negative for heartburn, constipation and melena.  Genitourinary: Negative for dysuria, urgency, frequency and hematuria.  Musculoskeletal: Positive for myalgias and back pain. Negative for neck pain.  Skin: Negative for rash.  Neurological: Negative for dizziness, tingling, sensory change, speech change, focal weakness and headaches.  Endo/Heme/Allergies: Does not bruise/bleed easily.  Psychiatric/Behavioral: Negative for depression.    MEDICATIONS AT HOME:   Prior to Admission medications   Medication Sig Start Date End Date Taking? Authorizing Provider  albuterol (PROVENTIL HFA;VENTOLIN HFA) 108 (90 Base) MCG/ACT inhaler Inhale 2 puffs into the lungs every 6 (six) hours as needed for wheezing or shortness of breath. 12/19/15  Yes Gladstone Lighter, MD  amLODipine (NORVASC) 5 MG tablet Take 5 mg by mouth daily.   Yes Historical Provider, MD  apixaban (ELIQUIS) 5 MG TABS tablet Take 1 tablet (5 mg total) by mouth 2 (two) times daily. 04/07/15  Yes Bettey Costa, MD  atorvastatin (LIPITOR) 40 MG tablet Take 40 mg by mouth at bedtime.   Yes Historical Provider, MD  benzonatate (TESSALON) 200 MG capsule Take 1 capsule (200 mg total) by mouth 3  (three) times daily as needed for cough. 12/19/15  Yes Gladstone Lighter, MD  budesonide-formoterol (SYMBICORT) 160-4.5 MCG/ACT inhaler Inhale 2 puffs into the lungs 2 (two) times daily. 12/19/15  Yes Gladstone Lighter, MD  citalopram (CELEXA) 20 MG tablet Take 20 mg by mouth daily.   Yes Historical Provider, MD  dicyclomine (BENTYL) 10 MG capsule Take 10 mg by mouth 4 (four) times daily -  before meals and at bedtime.    Yes Historical Provider, MD  insulin aspart (NOVOLOG) 100 UNIT/ML injection Inject into the skin 3 (three) times daily with meals as needed for high blood sugar. Pt uses as needed per sliding scale.   Yes Historical Provider, MD  insulin detemir (LEVEMIR) 100 UNIT/ML injection Inject 17 Units into the skin at bedtime.   Yes Historical Provider, MD  levothyroxine (SYNTHROID, LEVOTHROID) 75 MCG tablet Take 1 tablet (75 mcg total) by mouth daily before breakfast. 10/08/15  Yes Loleta Chance, MD  magnesium oxide (MAG-OX) 400 (241.3 Mg) MG tablet Take 1  tablet (400 mg total) by mouth 2 (two) times daily. 11/16/15  Yes Belkys A Regalado, MD  metoCLOPramide (REGLAN) 5 MG tablet Take 1 tablet (5 mg total) by mouth 4 (four) times daily -  before meals and at bedtime. 12/19/15  Yes Gladstone Lighter, MD  metoprolol tartrate (LOPRESSOR) 25 MG tablet Take 25 mg by mouth 2 (two) times daily.    Yes Historical Provider, MD  morphine (MS CONTIN) 15 MG 12 hr tablet Take 1 tablet (15 mg total) by mouth every 8 (eight) hours. 10/21/15  Yes Lance Bosch, MD  oxyCODONE (OXY IR/ROXICODONE) 5 MG immediate release tablet Take 5-10 mg by mouth every 6 (six) hours as needed for severe pain.   Yes Historical Provider, MD  pantoprazole (PROTONIX) 40 MG tablet Take 40 mg by mouth 2 (two) times daily before a meal.   Yes Historical Provider, MD  rifaximin (XIFAXAN) 550 MG TABS tablet Take 1 tablet (550 mg total) by mouth 2 (two) times daily. 07/11/15  Yes Theodoro Grist, MD  tiZANidine (ZANAFLEX) 4 MG tablet Take 4 mg by  mouth 3 (three) times daily as needed for muscle spasms.    Yes Historical Provider, MD      VITAL SIGNS:  Blood pressure 104/63, pulse 54, temperature 98.5 F (36.9 C), temperature source Oral, resp. rate 14, height 5' 3"  (1.6 m), weight 58.968 kg (130 lb), SpO2 100 %.  PHYSICAL EXAMINATION:   Physical Exam  GENERAL:  60 y.o.-year-old patient lying in the bed with no acute distress.  EYES: Pupils equal, round, reactive to light and accommodation. No scleral icterus. Extraocular muscles intact.  HEENT: Head atraumatic, normocephalic. Oropharynx and nasopharynx clear.  NECK:  Supple, no jugular venous distention. No thyroid enlargement, no tenderness.  LUNGS: Normal breath sounds bilaterally, no wheezing, rales,rhonchi or crepitation. No use of accessory muscles of respiration. Decreased bibasilar breath sounds CARDIOVASCULAR: S1, S2 normal. No murmurs, rubs, or gallops.  ABDOMEN: Soft, nontender, minimal discomfort on palpation in mid abdominal area and RLQ quadrant, nondistended. Bowel sounds present. No organomegaly or mass.  EXTREMITIES: No pedal edema, cyanosis, or clubbing.  NEUROLOGIC: Cranial nerves II through XII are intact. Muscle strength 5/5 in all extremities. Sensation intact. Gait not checked.  PSYCHIATRIC: The patient is alert and oriented x 3.  SKIN: No obvious rash, lesion, or ulcer.   LABORATORY PANEL:   CBC  Recent Labs Lab 01/15/16 0916  WBC 6.1  HGB 9.7*  HCT 29.3*  PLT 138*   ------------------------------------------------------------------------------------------------------------------  Chemistries   Recent Labs Lab 01/15/16 0916  NA 137  K 3.2*  CL 106  CO2 23  GLUCOSE 271*  BUN 13  CREATININE 0.63  CALCIUM 8.6*  AST 27  ALT 22  ALKPHOS 86  BILITOT 1.2   ------------------------------------------------------------------------------------------------------------------  Cardiac Enzymes  Recent Labs Lab 01/15/16 0916  TROPONINI  0.05*   ------------------------------------------------------------------------------------------------------------------  RADIOLOGY:  Ct Abdomen Pelvis W Contrast  01/15/2016  CLINICAL DATA:  Nausea, vomiting, generalized abdominal pain, seen on 01/10/2016 for similar indications, history type II diabetes mellitus, chronic superficial gastritis, hypertension, chronic anemia, uterine cancer, cirrhosis, prior pancreatitis EXAM: CT ABDOMEN AND PELVIS WITH CONTRAST TECHNIQUE: Multidetector CT imaging of the abdomen and pelvis was performed using the standard protocol following bolus administration of intravenous contrast. Sagittal and coronal MPR images reconstructed from axial data set. CONTRAST:  116m ISOVUE-300 IOPAMIDOL (ISOVUE-300) INJECTION 61% IV. Dilute oral contrast. COMPARISON:  01/10/2016 FINDINGS: Bibasilar atelectasis dependently. Nodular cirrhotic liver without focal mass. Post cholecystectomy. Nonobstructing 9  mm calculus inferior pole RIGHT kidney. Tiny nonobstructing mid LEFT renal calculus. Tiny RIGHT renal cyst. Remainder of liver, spleen, pancreas, kidneys, and adrenal glands normal. Colonic wall thickening extending from cecum through descending colon compatible with colitis. Appendix not visualized. Distal small bowel on opacified and incompletely distended with suboptimal assessment of wall thickness. Focal wall thickening of stomach at gastroesophageal junction question prior fundoplication. Remainder of stomach and small bowel loops unremarkable. Normal appearing bladder and ureters. Uterus surgically absent with nonvisualization of ovaries. Significant ascites. Recanalization of the umbilical vein and few perigastric varices noted. Scattered atherosclerotic calcifications with major vessels patent. No mass, adenopathy, free air, hernia, or acute bone lesion. IMPRESSION: Cirrhotic liver with ascites. Nonobstructing renal calculi. Diffuse colonic wall thickening from cecum through  descending colon compatible with colitis ; differential diagnosis includes infection, inflammatory bowel disease, ischemia much less likely. Electronically Signed   By: Lavonia Dana M.D.   On: 01/15/2016 12:53    EKG:   Orders placed or performed during the hospital encounter of 01/15/16  . ED EKG  . ED EKG  . ED EKG  . ED EKG    IMPRESSION AND PLAN:   [name]  is a 60 y.o. female with a known history of multiple medical problems including insulin-dependent diabetes mellitus, chronic abdominal pain, gastroparesis, chronic anemia, paroxysmal atrial fibrillation on eliquis, Tachy Brady syndrome, nonalcoholic liver cirrhosis, prior CVA with minimal left-sided weakness presents to the hospital 3 days after recent discharge her to worsening nausea, vomiting and abdominal pain.  #1 diffuse colitis- recurrent diffuse colitis\ - CT abd done on adm, last colonoscopy in Feb 2016- by Dr. Candace Cruise- normal - on zosyn and flagyl for now - liquid diet, IV fluids and GI consulted  #2 diabetic gastroparesis- change reglan to IV for now, GI consulted - gastric emptying study with small bowel follow through tomorrow - on liquid diet, IV fluids, anti emetics - remote h/o Nissen's fundoplication and ringed esophagus, followed [facility] GI at [facility] in past  #3 hypotension- Hypovolemic hypotension, IV fluids and monitor - Improving and not symptomatic - no indication for pressors yet - hold metoprolol. Ok for MAP>65 or SBP>90  #4 diabetes mellitus- levemir dose decreased as patient only on a liquid diet. -Added sliding scale insulin  #5 atrial fibrillation with frequent arrhythmias-follows with [place] cardiology. Currently in NSR. Hold metoprolol due to hypotension now Recent cardiac cath with minimal disease last week eliquis held for any possible GI procedures  #6 chronic abdominal pain-continue MS Contin and also morphine for breakthrough pain medication  #7 nonalcoholic liver cirrhosis-lactulose is  discontinued due to her diarrhea. Continue rifaximin.  #8 GERD-continue Protonix twice a day  #9 DVT prophylaxis-we'll start on Lovenox while eliquis is on hold    All the records are reviewed and case discussed with ED provider. Management plans discussed with the patient, family and they are in agreement.  CODE STATUS: Full Code  TOTAL TIME TAKING CARE OF THIS PATIENT: 50 minutes.    Gladstone Lighter M.D on 01/15/2016 at 1:43 PM  Between 7am to 6pm - Pager - [phone]  After 6pm go to [email or web address] - password [email or web address] [facility]  [facility]  Office  [phone]  CC: Primary care physician; [place] Pink, MD

## 2016-01-15 NOTE — ED Notes (Signed)
Pt presents via EMS from home c/o abd pain with N/V/D. Reports hx diabetes and gastroparesis. Cardiac cath on Friday which was negative per EMS> Pt abd pain is generalized to upper abd.

## 2016-01-15 NOTE — Progress Notes (Signed)
Pharmacy Antibiotic Note  [name] is a 60 y.o. female admitted on 01/15/2016 with Intra-Abdominal Infection.  Pharmacy has been consulted for Zosyn dosing.  Plan: Patient received Zosyn 3.375 IV X1 in ED and Vancomycin 1g IV x1 dose.   Will start patient on Zosyn 3.375 IV EI every 8 hours.   Height: 5' 3"  (160 cm) Weight: 130 lb (58.968 kg) IBW/kg (Calculated) : 52.4  Temp (24hrs), Avg:98.5 F (36.9 C), Min:98.5 F (36.9 C), Max:98.5 F (36.9 C)   Recent Labs Lab 01/10/16 1513 01/10/16 1607 01/10/16 1929 01/12/16 0330 01/13/16 0416 01/15/16 0916 01/15/16 1106  WBC 6.9  --   --  10.0 8.5 6.1  --   CREATININE 0.86  --   --   --   --  0.63  --   LATICACIDVEN  --  4.9* 4.1*  --   --   --  1.8    Estimated Creatinine Clearance: 60.3 mL/min (by C-G formula based on Cr of 0.63).    Allergies  Allergen Reactions  . Hydrocodone Other (See Comments)    Pt states that this medication caused cirrhosis of the liver.    . Erythromycin Other (See Comments)    Reaction:  Fever   . Prednisone Other (See Comments)    Reaction:  Unknown   . Rosiglitazone Maleate Swelling  . Codeine Sulfate Rash  . Tetanus-Diphtheria Toxoids Td Rash and Other (See Comments)    Reaction:  Fever     Antimicrobials this admission: 01/15/16 Vancomycin >> 01/15/16 01/15/16 Zosyn >>   Microbiology results: 5/7 BCx: pending  Thank you for allowing pharmacy to be a part of this patient's care.  Nancy Fetter, PharmD Pharmacy Resident  01/15/2016 1:50 PM

## 2016-01-16 ENCOUNTER — Ambulatory Visit: Payer: Commercial Managed Care - HMO

## 2016-01-16 DIAGNOSIS — R1084 Generalized abdominal pain: Secondary | ICD-10-CM

## 2016-01-16 DIAGNOSIS — G43A0 Cyclical vomiting, not intractable: Secondary | ICD-10-CM

## 2016-01-16 DIAGNOSIS — R112 Nausea with vomiting, unspecified: Secondary | ICD-10-CM | POA: Insufficient documentation

## 2016-01-16 DIAGNOSIS — K529 Noninfective gastroenteritis and colitis, unspecified: Secondary | ICD-10-CM

## 2016-01-16 LAB — C DIFFICILE QUICK SCREEN W PCR REFLEX
C DIFFICILE (CDIFF) INTERP: NEGATIVE
C Diff antigen: NEGATIVE
C Diff toxin: NEGATIVE

## 2016-01-16 LAB — GASTROINTESTINAL PANEL BY PCR, STOOL (REPLACES STOOL CULTURE)
ASTROVIRUS: NOT DETECTED
Adenovirus F40/41: NOT DETECTED
CRYPTOSPORIDIUM: NOT DETECTED
CYCLOSPORA CAYETANENSIS: NOT DETECTED
Campylobacter species: NOT DETECTED
E. COLI O157: NOT DETECTED
ENTAMOEBA HISTOLYTICA: NOT DETECTED
Enteroaggregative E coli (EAEC): NOT DETECTED
Enteropathogenic E coli (EPEC): NOT DETECTED
Enterotoxigenic E coli (ETEC): NOT DETECTED
Giardia lamblia: NOT DETECTED
Norovirus GI/GII: NOT DETECTED
Plesimonas shigelloides: NOT DETECTED
Rotavirus A: NOT DETECTED
SALMONELLA SPECIES: NOT DETECTED
SAPOVIRUS (I, II, IV, AND V): NOT DETECTED
SHIGELLA/ENTEROINVASIVE E COLI (EIEC): NOT DETECTED
Shiga like toxin producing E coli (STEC): NOT DETECTED
VIBRIO CHOLERAE: NOT DETECTED
VIBRIO SPECIES: NOT DETECTED
YERSINIA ENTEROCOLITICA: NOT DETECTED

## 2016-01-16 LAB — GLUCOSE, CAPILLARY
GLUCOSE-CAPILLARY: 210 mg/dL — AB (ref 65–99)
Glucose-Capillary: 181 mg/dL — ABNORMAL HIGH (ref 65–99)
Glucose-Capillary: 173 mg/dL — ABNORMAL HIGH (ref 65–99)

## 2016-01-16 MED ORDER — ACETAMINOPHEN 325 MG PO TABS: 650.0000 mg | ORAL_TABLET | ORAL | Status: DC | PRN

## 2016-01-16 MED ORDER — INSULIN ASPART 100 UNIT/ML ~~LOC~~ SOLN
0.0000 [IU] | Freq: Three times a day (TID) | SUBCUTANEOUS | Status: DC
  Administered 2016-01-16 – 2016-01-19 (×4): 2 [IU] via SUBCUTANEOUS
  Filled 2016-01-16 (×4): qty 2

## 2016-01-16 MED ORDER — MAGNESIUM CITRATE PO SOLN
1.0000 | Freq: Once | ORAL | Status: AC
  Administered 2016-01-16: 1 via ORAL
  Filled 2016-01-16: qty 296

## 2016-01-16 MED ORDER — TECHNETIUM TC 99M SULFUR COLLOID
2.0200 | Freq: Once | INTRAVENOUS | Status: AC | PRN
  Administered 2016-01-16: 11:00:00 2.02 via INTRAVENOUS

## 2016-01-16 NOTE — Progress Notes (Signed)
Inpatient Diabetes Program Recommendations  AACE/ADA: New Consensus Statement on Inpatient Glycemic Control (2015)  Target Ranges:  Prepandial:   less than 140 mg/dL      Peak postprandial:   less than 180 mg/dL (1-2 hours)      Critically ill patients:  140 - 180 mg/dL  Results for [name], [name] (MRN [id number]) as of 01/16/2016 12:50  Ref. Range 01/15/2016 22:04  Glucose-Capillary Latest Ref Range: 65-99 mg/dL 378 (H)   Review of Glycemic Control  Diabetes history: DM2 Outpatient Diabetes medications: Levemir 17 units QHS, Novolog TID with meals as needed Current orders for Inpatient glycemic control: Levemir 10 units QHS  Inpatient Diabetes Program Recommendations: Insulin - Basal: If glucose remains elevated, please consider increasing Levemir. Correction (SSI): Please order CBGs with Novolog correction scale Q4H while NPO and change to ACHS once diet is resumed and patient is tolerating diet.  Thanks, Barnie Alderman, RN, MSN, CDE Diabetes Coordinator Inpatient Diabetes Program [phone] (Team Pager from [place] to [place]) [phone] (AP office) [phone] [facility] office) [phone] [facility] office)

## 2016-01-16 NOTE — Consult Note (Signed)
[facility]  [address] [place], [place] [address] Phone: [phone] Fax : [phone]  Consultation  Referring Provider:     No ref. provider found Primary Care Physician:  Maryland Pink, MD Primary Gastroenterologist:  Dr. Candace Cruise         Reason for Consultation:     Abnormal CT scan with diarrhea and abdominal pain  Date of Admission:  01/15/2016 Date of Consultation:  01/16/2016         HPI:   [name] is a 60 y.o. female who comes in with epigastric abdominal pain and had a gastric empty study today that showed her to have delayed gastric emptying. The patient has long-standing diabetes. The patient had an upper endoscopy last year by Dr. Candace Cruise and had a colonoscopy many years ago. The patient now comes in with abdominal pain and nausea and vomiting. The patient states she has lost 20 pounds in the last week. The patient also reports that she's been having problems with her stomach for many years and the cause has not been delineated. The patient states that she has severe epigastric pain that is a 8 out of 10. The patient also reports that she has been having diarrhea and she had a CT scan that showed diffuse colitis. She denies any black stools or bloody stools.  Past Medical History  Diagnosis Date  . Type 1 diabetes ([place])     on levemir  . Hypothyroid   . Degenerative disk disease   . Stomach ulcer   . Diverticulitis   . Syncope 01/2015  . Anxiety   . GERD (gastroesophageal reflux disease)   . History of hiatal hernia   . Cancer (HCC)     HX OF CANCER OF UTERUS   . TIA (transient ischemic attack) 02/2015  . Hypertension   . Pancreatitis   . PAF (paroxysmal atrial fibrillation) ([place]) 03/2015    a. new onset 03/2015 in setting of intractable N/V; b. on Eliquis 5 mg bid; c. CHADSVASc 4 (DM, TIA x 2, female)  . Intussusception intestine ([place]) 05/2015  . Stroke [facility])     with minimal left sided weakness  . Allergy   . Cirrhosis of liver not due to  alcohol ([place]) 2016  . Gastroparesis   . Sick sinus syndrome [facility])     Past Surgical History  Procedure Laterality Date  . Hernia repair    . Abdominal hysterectomy    . Cholecystectomy    . Esophagogastroduodenoscopy N/A 04/04/2015    Procedure: ESOPHAGOGASTRODUODENOSCOPY (EGD);  Surgeon: Hulen Luster, MD;  Location: [facility] ENDOSCOPY;  Service: Endoscopy;  Laterality: N/A;  . Cardiac catheterization N/A 01/12/2016    Procedure: Left Heart Cath and Coronary Angiography;  Surgeon: Wellington Hampshire, MD;  Location: [facility] CV LAB;  Service: Cardiovascular;  Laterality: N/A;    Prior to Admission medications   Medication Sig Start Date End Date Taking? Authorizing Provider  albuterol (PROVENTIL HFA;VENTOLIN HFA) 108 (90 Base) MCG/ACT inhaler Inhale 2 puffs into the lungs every 6 (six) hours as needed for wheezing or shortness of breath. 12/19/15  Yes Gladstone Lighter, MD  amLODipine (NORVASC) 5 MG tablet Take 5 mg by mouth daily.   Yes Historical Provider, MD  apixaban (ELIQUIS) 5 MG TABS tablet Take 1 tablet (5 mg total) by mouth 2 (two) times daily. 04/07/15  Yes Bettey Costa, MD  atorvastatin (LIPITOR) 40 MG tablet Take 40 mg by mouth at bedtime.   Yes Historical Provider,  MD  benzonatate (TESSALON) 200 MG capsule Take 1 capsule (200 mg total) by mouth 3 (three) times daily as needed for cough. 12/19/15  Yes Gladstone Lighter, MD  budesonide-formoterol (SYMBICORT) 160-4.5 MCG/ACT inhaler Inhale 2 puffs into the lungs 2 (two) times daily. 12/19/15  Yes Gladstone Lighter, MD  citalopram (CELEXA) 20 MG tablet Take 20 mg by mouth daily.   Yes Historical Provider, MD  dicyclomine (BENTYL) 10 MG capsule Take 10 mg by mouth 4 (four) times daily -  before meals and at bedtime.    Yes Historical Provider, MD  insulin aspart (NOVOLOG) 100 UNIT/ML injection Inject into the skin 3 (three) times daily with meals as needed for high blood sugar. Pt uses as needed per sliding scale.   Yes Historical Provider, MD    insulin detemir (LEVEMIR) 100 UNIT/ML injection Inject 17 Units into the skin at bedtime.   Yes Historical Provider, MD  levothyroxine (SYNTHROID, LEVOTHROID) 75 MCG tablet Take 1 tablet (75 mcg total) by mouth daily before breakfast. 10/08/15  Yes Loleta Chance, MD  magnesium oxide (MAG-OX) 400 (241.3 Mg) MG tablet Take 1 tablet (400 mg total) by mouth 2 (two) times daily. 11/16/15  Yes Belkys A Regalado, MD  metoCLOPramide (REGLAN) 5 MG tablet Take 1 tablet (5 mg total) by mouth 4 (four) times daily -  before meals and at bedtime. 12/19/15  Yes Gladstone Lighter, MD  metoprolol tartrate (LOPRESSOR) 25 MG tablet Take 25 mg by mouth 2 (two) times daily.    Yes Historical Provider, MD  morphine (MS CONTIN) 15 MG 12 hr tablet Take 1 tablet (15 mg total) by mouth every 8 (eight) hours. 10/21/15  Yes Lance Bosch, MD  oxyCODONE (OXY IR/ROXICODONE) 5 MG immediate release tablet Take 5-10 mg by mouth every 6 (six) hours as needed for severe pain.   Yes Historical Provider, MD  pantoprazole (PROTONIX) 40 MG tablet Take 40 mg by mouth 2 (two) times daily before a meal.   Yes Historical Provider, MD  rifaximin (XIFAXAN) 550 MG TABS tablet Take 1 tablet (550 mg total) by mouth 2 (two) times daily. 07/11/15  Yes Theodoro Grist, MD  tiZANidine (ZANAFLEX) 4 MG tablet Take 4 mg by mouth 3 (three) times daily as needed for muscle spasms.    Yes Historical Provider, MD    Family History  Problem Relation Age of Onset  . Hypertension Mother   . CAD Sister   . Heart attack Sister     Deceased Nov 25, 2014  . CAD Brother      Social History  Substance Use Topics  . Smoking status: Never Smoker   . Smokeless tobacco: Never Used  . Alcohol Use: No    Allergies as of 01/15/2016 - Review Complete 01/15/2016  Allergen Reaction Noted  . Hydrocodone Other (See Comments) 10/21/2015  . Erythromycin Other (See Comments) 12/16/2015  . Prednisone Other (See Comments) 10/04/2015  . Rosiglitazone maleate Swelling   . Codeine  sulfate Rash   . Tetanus-diphtheria toxoids td Rash and Other (See Comments)     Review of Systems:    All systems reviewed and negative except where noted in HPI.   Physical Exam:  Vital signs in last 24 hours: Temp:  [98.2 F (36.8 C)-98.7 F (37.1 C)] 98.7 F (37.1 C) (05/08 1522) Pulse Rate:  [86-103] 103 (05/08 1522) Resp:  [18-20] 18 (05/08 1522) BP: (133-167)/(71-80) 167/80 mmHg (05/08 1522) SpO2:  [98 %-100 %] 100 % (05/08 1522) Last BM Date: 01/16/16 General:  Pleasant, cooperative in NAD Head:  Normocephalic and atraumatic. Eyes:   No icterus.   Conjunctiva pink. PERRLA. Ears:  Normal auditory acuity. Neck:  Supple; no masses or thyroidomegaly Lungs: Respirations even and unlabored. Lungs clear to auscultation bilaterally.   No wheezes, crackles, or rhonchi.  Heart:  Regular rate and rhythm;  Without murmur, clicks, rubs or gallops Abdomen:  Soft, nondistended, Diffusely mildly tender. Normal bowel sounds. No appreciable masses or hepatomegaly.  No rebound or guarding.  Rectal:  Not performed. Msk:  Symmetrical without gross deformities.    Extremities:  Without edema, cyanosis or clubbing. Neurologic:  Alert and oriented x3;  grossly normal neurologically. Skin:  Intact without significant lesions or rashes. Cervical Nodes:  No significant cervical adenopathy. Psych:  Alert and cooperative. Normal affect.  LAB RESULTS:  Recent Labs  01/15/16 0916  WBC 6.1  HGB 9.7*  HCT 29.3*  PLT 138*   BMET  Recent Labs  01/15/16 0916  NA 137  K 3.2*  CL 106  CO2 23  GLUCOSE 271*  BUN 13  CREATININE 0.63  CALCIUM 8.6*   LFT  Recent Labs  01/15/16 0916  PROT 5.9*  ALBUMIN 3.2*  AST 27  ALT 22  ALKPHOS 86  BILITOT 1.2   PT/INR No results for input(s): LABPROT, INR in the last 72 hours.  STUDIES: Nm Gastric Emptying  01/16/2016  CLINICAL DATA:  Nausea and vomiting for 4 days. EXAM: NUCLEAR MEDICINE GASTRIC EMPTYING SCAN TECHNIQUE: After oral  ingestion of radiolabeled meal, sequential abdominal images were obtained for 4 hours. Percentage of activity emptying the stomach was calculated at 1 hour, 2 hour, 3 hour, and 4 hours. RADIOPHARMACEUTICALS:  2.02 mCi Tc-15mMDP labeled sulfur colloid orally COMPARISON:  None. FINDINGS: Expected location of the stomach in the left upper quadrant. Ingested meal empties the stomach gradually over the course of the study. 44% emptied at 1 hr ( normal >= 10%) 51% emptied at 2 hr ( normal >= 40%) 70% emptied at 3 hr ( normal >= 70%) 78% emptied at 4 hr ( normal >= 90%) IMPRESSION: Delayed gastric emptying study. Electronically Signed   By: JMarijo Conception M.D.   On: 01/16/2016 15:07   Ct Abdomen Pelvis W Contrast  01/15/2016  CLINICAL DATA:  Nausea, vomiting, generalized abdominal pain, seen on 01/10/2016 for similar indications, history type II diabetes mellitus, chronic superficial gastritis, hypertension, chronic anemia, uterine cancer, cirrhosis, prior pancreatitis EXAM: CT ABDOMEN AND PELVIS WITH CONTRAST TECHNIQUE: Multidetector CT imaging of the abdomen and pelvis was performed using the standard protocol following bolus administration of intravenous contrast. Sagittal and coronal MPR images reconstructed from axial data set. CONTRAST:  1034mISOVUE-300 IOPAMIDOL (ISOVUE-300) INJECTION 61% IV. Dilute oral contrast. COMPARISON:  01/10/2016 FINDINGS: Bibasilar atelectasis dependently. Nodular cirrhotic liver without focal mass. Post cholecystectomy. Nonobstructing 9 mm calculus inferior pole RIGHT kidney. Tiny nonobstructing mid LEFT renal calculus. Tiny RIGHT renal cyst. Remainder of liver, spleen, pancreas, kidneys, and adrenal glands normal. Colonic wall thickening extending from cecum through descending colon compatible with colitis. Appendix not visualized. Distal small bowel on opacified and incompletely distended with suboptimal assessment of wall thickness. Focal wall thickening of stomach at  gastroesophageal junction question prior fundoplication. Remainder of stomach and small bowel loops unremarkable. Normal appearing bladder and ureters. Uterus surgically absent with nonvisualization of ovaries. Significant ascites. Recanalization of the umbilical vein and few perigastric varices noted. Scattered atherosclerotic calcifications with major vessels patent. No mass, adenopathy, free air, hernia, or acute  bone lesion. IMPRESSION: Cirrhotic liver with ascites. Nonobstructing renal calculi. Diffuse colonic wall thickening from cecum through descending colon compatible with colitis ; differential diagnosis includes infection, inflammatory bowel disease, ischemia much less likely. Electronically Signed   By: Lavonia Dana M.D.   On: 01/15/2016 12:53      Impression / Plan:   NATASHIA ROSEMAN is a 60 y.o. y/o female with Who comes in with colitis on a CT scan that is diffuse. The patient also has gastric emptying delay on a gastric emptying study. The patient's gastric emptying delay is likely due to her diabetes. The patient's C. difficile and her diarrhea panel were all negative for a infectious cause of her symptoms. The patient will be set up for a upper endoscopy and flexible sigmoidoscopy for tomorrow. The upper endoscopy due to the patient's epigastric pain and nausea vomiting with her sigmoidoscopy to be done because of the diffuse colitis and for biopsies.I have discussed risks & benefits which include, but are not limited to, bleeding, infection, perforation & drug reaction.  The patient agrees with this plan & written consent will be obtained.      Thank you for involving me in the care of this patient.      LOS: 1 day   Lucilla Lame, MD  01/16/2016, 4:58 PM   Note: This dictation was prepared with Dragon dictation along with smaller phrase technology. Any transcriptional errors that result from this process are unintentional.

## 2016-01-16 NOTE — Telephone Encounter (Signed)
Pt is still in the hospital.

## 2016-01-16 NOTE — Care Management (Addendum)
Presented to [facility] with the diagnosis of colitis. Discharged from this facility 01/13/16. Lives with husband, [name]. [phone]). Sees Dr. Kary Kos as her primary care physician. Uses a cane and rolling walker to aid in ambulation. Followed by [facility]. Scheduled for Gastric studies today. Receiving IV FLagyl and Zosyn Shelbie Ammons RN MSN CCM Care Management [phone]

## 2016-01-16 NOTE — Progress Notes (Signed)
[name] at [name] NAME: [name]    MR#:  [id number]  DATE OF BIRTH:  05-05-1956  SUBJECTIVE:  CHIEF COMPLAINT:  Patient is resting comfortably. Reporting lower abdominal pain and diarrhea. Denies noticing any blood in her stool. Patient is concerned as she has been admitting to the hospital multiple times with abdominal symptoms. Awaiting for gastric emptying study  REVIEW OF SYSTEMS:  CONSTITUTIONAL: No fever, fatigue or weakness.  EYES: No blurred or double vision.  EARS, NOSE, AND THROAT: No tinnitus or ear pain.  RESPIRATORY: No cough, shortness of breath, wheezing or hemoptysis.  CARDIOVASCULAR: No chest pain, orthopnea, edema.  GASTROINTESTINAL: No nausea, vomiting, Reporting diarrhea and reporting from below umbilical abdominal pain GENITOURINARY: No dysuria, hematuria.  ENDOCRINE: No polyuria, nocturia,  HEMATOLOGY: No anemia, easy bruising or bleeding SKIN: No rash or lesion. MUSCULOSKELETAL: No joint pain or arthritis.   NEUROLOGIC: No tingling, numbness, weakness.  PSYCHIATRY: No anxiety or depression.   DRUG ALLERGIES:   Allergies  Allergen Reactions  . Hydrocodone Other (See Comments)    Pt states that this medication caused cirrhosis of the liver.    . Erythromycin Other (See Comments)    Reaction:  Fever   . Prednisone Other (See Comments)    Reaction:  Unknown   . Rosiglitazone Maleate Swelling  . Codeine Sulfate Rash  . Tetanus-Diphtheria Toxoids Td Rash and Other (See Comments)    Reaction:  Fever     VITALS:  Blood pressure 133/71, pulse 103, temperature 98.2 F (36.8 C), temperature source Oral, resp. rate 18, height 5' 3"  (1.6 m), weight 61.825 kg (136 lb 4.8 oz), SpO2 98 %.  PHYSICAL EXAMINATION:  GENERAL:  60 y.o.-year-old patient lying in the bed with no acute distress.  EYES: Pupils equal, round, reactive to light and accommodation. No scleral icterus. Extraocular muscles intact.   HEENT: Head atraumatic, normocephalic. Oropharynx and nasopharynx clear.  NECK:  Supple, no jugular venous distention. No thyroid enlargement, no tenderness.  LUNGS: Normal breath sounds bilaterally, no wheezing, rales,rhonchi or crepitation. No use of accessory muscles of respiration.  CARDIOVASCULAR: S1, S2 normal. No murmurs, rubs, or gallops.  ABDOMEN: Soft, Lower abdominal tenderness is present but no rebound tenderness, nondistended. Bowel sounds present. No organomegaly or mass.  EXTREMITIES: No pedal edema, cyanosis, or clubbing.  NEUROLOGIC: Cranial nerves II through XII are intact. Muscle strength 5/5 in all extremities. Sensation intact. Gait not checked.  PSYCHIATRIC: The patient is alert and oriented x 3.  SKIN: No obvious rash, lesion, or ulcer.    LABORATORY PANEL:   CBC  Recent Labs Lab 01/15/16 0916  WBC 6.1  HGB 9.7*  HCT 29.3*  PLT 138*   ------------------------------------------------------------------------------------------------------------------  Chemistries   Recent Labs Lab 01/15/16 0916  NA 137  K 3.2*  CL 106  CO2 23  GLUCOSE 271*  BUN 13  CREATININE 0.63  CALCIUM 8.6*  AST 27  ALT 22  ALKPHOS 86  BILITOT 1.2   ------------------------------------------------------------------------------------------------------------------  Cardiac Enzymes  Recent Labs Lab 01/15/16 0916  TROPONINI 0.05*   ------------------------------------------------------------------------------------------------------------------  RADIOLOGY:  Ct Abdomen Pelvis W Contrast  01/15/2016  CLINICAL DATA:  Nausea, vomiting, generalized abdominal pain, seen on 01/10/2016 for similar indications, history type II diabetes mellitus, chronic superficial gastritis, hypertension, chronic anemia, uterine cancer, cirrhosis, prior pancreatitis EXAM: CT ABDOMEN AND PELVIS WITH CONTRAST TECHNIQUE: Multidetector CT imaging of the abdomen and pelvis was performed using the  standard protocol following bolus administration  of intravenous contrast. Sagittal and coronal MPR images reconstructed from axial data set. CONTRAST:  173m ISOVUE-300 IOPAMIDOL (ISOVUE-300) INJECTION 61% IV. Dilute oral contrast. COMPARISON:  01/10/2016 FINDINGS: Bibasilar atelectasis dependently. Nodular cirrhotic liver without focal mass. Post cholecystectomy. Nonobstructing 9 mm calculus inferior pole RIGHT kidney. Tiny nonobstructing mid LEFT renal calculus. Tiny RIGHT renal cyst. Remainder of liver, spleen, pancreas, kidneys, and adrenal glands normal. Colonic wall thickening extending from cecum through descending colon compatible with colitis. Appendix not visualized. Distal small bowel on opacified and incompletely distended with suboptimal assessment of wall thickness. Focal wall thickening of stomach at gastroesophageal junction question prior fundoplication. Remainder of stomach and small bowel loops unremarkable. Normal appearing bladder and ureters. Uterus surgically absent with nonvisualization of ovaries. Significant ascites. Recanalization of the umbilical vein and few perigastric varices noted. Scattered atherosclerotic calcifications with major vessels patent. No mass, adenopathy, free air, hernia, or acute bone lesion. IMPRESSION: Cirrhotic liver with ascites. Nonobstructing renal calculi. Diffuse colonic wall thickening from cecum through descending colon compatible with colitis ; differential diagnosis includes infection, inflammatory bowel disease, ischemia much less likely. Electronically Signed   By: MLavonia DanaM.D.   On: 01/15/2016 12:53    EKG:   Orders placed or performed during the hospital encounter of 01/15/16  . ED EKG  . ED EKG  . ED EKG  . ED EKG    ASSESSMENT AND PLAN:    BDarlisha [name] a 60y.o. female with a known history of multiple medical problems including insulin-dependent diabetes mellitus, chronic abdominal pain, gastroparesis, chronic anemia,  paroxysmal atrial fibrillation on eliquis, Tachy Brady syndrome, nonalcoholic liver cirrhosis, prior CVA with minimal left-sided weakness presents to the hospital 3 days after recent discharge her to worsening nausea, vomiting and abdominal pain.  #1 diffuse colitis- recurrent diffuse colitis\ - CT abd done on adm, last colonoscopy in Feb 2016- by Dr. OCandace Cruise normal - on zosyn and flagyl for now - liquid diet, IV fluids and GI consulted -Pending gastric emptying study and small bowel follow-through  stool cultures and occult blood are pending -  #2 diabetic gastroparesis- change reglan to IV for now -GI consult is pending - gastric emptying study with small bowel follow through tomorrow - on liquid diet, IV fluids, anti emetics - remote h/o Nissen's fundoplication and ringed esophagus, [facility]  #3 hypotension- Hypovolemic hypotension, IV fluids and monitor - Improving and not symptomatic - no indication for pressors yet - hold metoprolol. Ok for MAP>65 or SBP>90  #4 diabetes mellitus- levemir dose decreased as patient only on a liquid diet. -Added sliding scale insulin  #5 atrial fibrillation with frequent arrhythmias-follows with [facility]. Currently in NS[facility]. Hold metoprolol due to hypotension now Recent cardiac cath with minimal disease last week eliquis held for any possible GI procedures  #6 chronic abdominal pain-continue MS Contin and also morphine for breakthrough pain medication  #7 nonalcoholic liver cirrhosis-lactulose is discontinued due to her diarrhea. Continue rifaximin.  #8 GERD-continue Protonix twice a day  #9 DVT prophylaxis-we'll start on Lovenox while eliquis is on hold     All the records are reviewed and case discussed with Care Management/Social Workerr. Management plans discussed with the patient, AND SHE is in agreement.  CODE STATUS: DNR  TOTAL TIME TAKING CARE OF THIS PATIENT: 35 minutes.   POSSIBLE D/C IN 2-3 DAYS,  DEPENDING ON CLINICAL CONDITION.   GNicholes MangoM.D on 01/16/2016 at 2:01 PM  Between 7am to 6pm -  Pager - [phone] After 6pm go to [email or web address] - password [email or web address] [facility]  [place] [facility]  Office  [phone]  CC: Primary care physician; Maryland Pink, MD

## 2016-01-17 ENCOUNTER — Encounter
Admission: EM | Disposition: A | Discharge status: Home / Self Care | Payer: Self-pay | Source: Home / Self Care | Attending: Internal Medicine

## 2016-01-17 ENCOUNTER — Encounter: Payer: Self-pay | Admitting: *Deleted

## 2016-01-17 ENCOUNTER — Inpatient Hospital Stay: Payer: Commercial Managed Care - HMO | Admitting: Anesthesiology

## 2016-01-17 ENCOUNTER — Other Ambulatory Visit: Payer: Self-pay | Admitting: *Deleted

## 2016-01-17 DIAGNOSIS — Z794 Long term (current) use of insulin: Secondary | ICD-10-CM

## 2016-01-17 DIAGNOSIS — E119 Type 2 diabetes mellitus without complications: Secondary | ICD-10-CM

## 2016-01-17 DIAGNOSIS — I480 Paroxysmal atrial fibrillation: Secondary | ICD-10-CM

## 2016-01-17 DIAGNOSIS — I420 Dilated cardiomyopathy: Secondary | ICD-10-CM

## 2016-01-17 DIAGNOSIS — Z01810 Encounter for preprocedural cardiovascular examination: Secondary | ICD-10-CM

## 2016-01-17 DIAGNOSIS — R1110 Vomiting, unspecified: Secondary | ICD-10-CM

## 2016-01-17 LAB — CBC WITH DIFFERENTIAL/PLATELET
BASOS PCT: 0 %
Basophils Absolute: 0 10*3/uL (ref 0–0.1)
EOS ABS: 0 10*3/uL (ref 0–0.7)
Eosinophils Relative: 0 %
HEMATOCRIT: 26.7 % — AB (ref 35.0–47.0)
HEMOGLOBIN: 8.7 g/dL — AB (ref 12.0–16.0)
LYMPHS ABS: 0.6 10*3/uL — AB (ref 1.0–3.6)
Lymphocytes Relative: 15 %
MCH: 27.7 pg (ref 26.0–34.0)
MCHC: 32.7 g/dL (ref 32.0–36.0)
MCV: 84.6 fL (ref 80.0–100.0)
MONO ABS: 0.2 10*3/uL (ref 0.2–0.9)
MONOS PCT: 6 %
NEUTROS ABS: 3.1 10*3/uL (ref 1.4–6.5)
Neutrophils Relative %: 79 %
Platelets: 111 10*3/uL — ABNORMAL LOW (ref 150–440)
RBC: 3.16 MIL/uL — ABNORMAL LOW (ref 3.80–5.20)
RDW: 16 % — AB (ref 11.5–14.5)
WBC: 3.9 10*3/uL (ref 3.6–11.0)

## 2016-01-17 LAB — COMPREHENSIVE METABOLIC PANEL
ALT: 21 U/L (ref 14–54)
ANION GAP: 5 (ref 5–15)
AST: 23 U/L (ref 15–41)
Albumin: 2.9 g/dL — ABNORMAL LOW (ref 3.5–5.0)
Alkaline Phosphatase: 76 U/L (ref 38–126)
BUN: 10 mg/dL (ref 6–20)
CHLORIDE: 111 mmol/L (ref 101–111)
CO2: 24 mmol/L (ref 22–32)
CREATININE: 0.55 mg/dL (ref 0.44–1.00)
Calcium: 8.4 mg/dL — ABNORMAL LOW (ref 8.9–10.3)
GFR calc non Af Amer: >60 mL/min (ref >60)
Glucose, Bld: 141 mg/dL — ABNORMAL HIGH (ref 65–99)
Potassium: 3.2 mmol/L — ABNORMAL LOW (ref 3.5–5.1)
SODIUM: 140 mmol/L (ref 135–145)
Total Bilirubin: 0.6 mg/dL (ref 0.3–1.2)
Total Protein: 5.7 g/dL — ABNORMAL LOW (ref 6.5–8.1)

## 2016-01-17 LAB — GLUCOSE, CAPILLARY
GLUCOSE-CAPILLARY: 131 mg/dL — AB (ref 65–99)
GLUCOSE-CAPILLARY: 109 mg/dL — AB (ref 65–99)
Glucose-Capillary: 111 mg/dL — ABNORMAL HIGH (ref 65–99)
Glucose-Capillary: 166 mg/dL — ABNORMAL HIGH (ref 65–99)
Glucose-Capillary: 174 mg/dL — ABNORMAL HIGH (ref 65–99)
Glucose-Capillary: 177 mg/dL — ABNORMAL HIGH (ref 65–99)

## 2016-01-17 LAB — HEMOGLOBIN A1C: Hgb A1c MFr Bld: 7.8 % — ABNORMAL HIGH (ref 4.0–6.0)

## 2016-01-17 LAB — MAGNESIUM: MAGNESIUM: 1.6 mg/dL — AB (ref 1.7–2.4)

## 2016-01-17 SURGERY — ESOPHAGOGASTRODUODENOSCOPY (EGD) WITH PROPOFOL
Anesthesia: General

## 2016-01-17 MED ORDER — METOPROLOL TARTRATE 25 MG PO TABS
25.0000 mg | ORAL_TABLET | Freq: Two times a day (BID) | ORAL | Status: DC
  Administered 2016-01-17 – 2016-01-19 (×5): 25 mg via ORAL
  Filled 2016-01-17 (×5): qty 1

## 2016-01-17 MED ORDER — MAGNESIUM SULFATE 2 GM/50ML IV SOLN
2.0000 g | Freq: Once | INTRAVENOUS | Status: AC
  Administered 2016-01-17: 2 g via INTRAVENOUS
  Filled 2016-01-17: qty 50

## 2016-01-17 MED ORDER — POTASSIUM CHLORIDE 10 MEQ/100ML IV SOLN
10.0000 meq | INTRAVENOUS | Status: AC
Start: 2016-01-17 — End: 2016-01-17
  Administered 2016-01-17 (×4): 10 meq via INTRAVENOUS
  Filled 2016-01-17 (×4): qty 100

## 2016-01-17 NOTE — Consult Note (Signed)
   [facility] CM Inpatient Consult   01/17/2016  [name] 1956-02-13 [id number]   Referral received from [facility] telephonic case manager to engage patient while in the hospital. Patient with a diagnosis history of Pneumonia, A-Fib, Diabetes and recently for nausea, vomiting and diarrhea and is eligible for post hospital discharge follow up. Patient was evaluated for community based chronic disease management services with [facility] care Management Program as a benefit of patient's [facility] Medicare. Met with the patient at the bedside to explain [facility] Management services. Made patient aware our attempts to reach her have been unsuccessful in the past and [name] talked in detail about the stress she had recently endured and her many hospitalizations over the last several months. [name] was agreeable to retry services. She voiced her present discharge plan was to go home if possible after she learns the results of planned testing.  Patient endorses her primary care provider to be Rita Pink, MD, but stated she has been unable to get to her appointments because she has been readmitted to the hospital before she can make it. She stated her sons take her to her appointments but there are times transportation is an issue. Patient given the [facility] transportation line to assist with transportation to MD appts and the [facility] [employer] number for post d/c meals. New Consent form signed, related to patient giving new names and numbers for follow up. Patient states her best contact number is [phone], but stated she nor her husband do not answer the phone if they do not recognize the number and they do not have an answering machine. Patient advised the best time to reach her was around 1pm in the afternoon. Patient also stated we could call her mother [name] at [phone] to assist in reaching the patient, this information added to the consent form. Patient also gave written consent to speak to her son  [name](did not know his number) and husband [name](home number). Patient will receive post hospital discharge calls and be evaluated for monthly home visits. [facility] Care Management services does not interfere with or replace any services arranged by the inpatient care management team. RNCM left contact information and [facility] literature at the bedside. Made inpatient RNCM aware that [facility] will be following for care management. For additional questions please contact:   Rita Guilbault RN, [facility] Liaison  [phone]) Business Mobile [phone]) Toll free office

## 2016-01-17 NOTE — Consult Note (Signed)
Cardiology Consultation Note  Patient ID: [name], MRN: [id number], DOB/AGE: [birth year] 60 y.o. Admit date: 01/15/2016   Date of Consult: 01/17/2016 Primary Physician: [place] Pink, MD Primary Cardiologist: [facility], though has never followed up as an outpatient Requesting Physician: Ricka Burdock, MD  Chief Complaint: Nausea, vomiting, and diarrhea  Reason for Consult: Cardiac clearance for EGD and sigmoidoscopy on 5/10  HPI: 60 y.o. female with h/o nonobstructive CAD by cardiac cath 01/12/2016, stress induced cardiomyopathy, PAF on Eliquis, tachy-brady syndrome, DM1, hypothyroidism, syncope, anxiety, GERD, TIA/CVA with residual left-sided weakness, SVT leading to brief respiratory distress, HTN, and uterine cancer who has been admitted to the hospital 3 times within the past 6 weeks for recurrent nausea, vomiting, and diarrhea associated with colitis returned to [facility] on 5/7 just two days after her most recent discharge with return of nausea, vomiting, and diarrhea. She was found to have colitis. She is scheduled for EGD and sigmoidoscopy on 5/10. Cardiology is consulted for cardiac clearance.  She was recently admitted to [facility] in January 2017 with dizziness, presyncope, and bradycardia. At that time she was taking metoprolol 25 mg bid and amiodarone 200 mg daily. She had previously been advised by EP to discontinue amiodarone 2/2 bradycardia which was felt to be leading to her falls and hypotension, though apparently this was restarted at sometime between September and January without seeing cardiology as an outpatient. QTc was noted to be prolonged while on amiodarone at 550 ms. During that admission echo showed EF 60-65%, normal wall motion, GR1DD. Carotid dopplers showed bilateral 1-39% stenosis. She was seen by EP given her tachy-brady syndrome and felt to not be a candidate for PPM. Given her bradycardic rates her amiodarone was not resumed and she was discharged on metoprolol. She has had several  hospital readmissions since then for severe abdominal pain, nausea, and vomiting of uncertain etiology. SHe has yet to follow up with GI as an outpatient. Admitted again to [facility] in mid to late April with return of intractable nausea, vomiting, and diarrhea with associated syncope. Cardiology was asked to evaluate for possible bradycardic rates into the 30's bpm; however, on telemetry there were no rates this bradycardic. She was hypotensive in the setting of sepsis 2/2 colitis that admission and required dopamine. Her troponin level remained negative. Echo was performed and is detailed below, though EF was noted to be 60-65%. TSH was found to be elevated and managed by IM. She remained in sinus rhythm and was continued on Eliquis and Lopressor. Admitted a 3rd time at the end of April for chest pain in the setting of worsening nausea, vomiting, and diarrhea. Troponin peaked at 0.49. She underwent cardiac cath that showed nonobstructive CAD. She was felt to have a stress-induced cardiomyopathy and medical management was advised along with evaluation and treatment of her underlying GI issues. She was discharged on 5/5, ate some soup for the first 24 hours, however her nausea, vomiting, and diarrhea returned on 5/7 prompting her to come in for evaluation. She had vomited a total of 6 times upon her admission, none since 5/8. She continues to have diarrhea, though has drank GI prep for sigmoidoscopy. She denies any chest pain, palpitations, SOB, diaphoresis, dizziness, presyncope, or syncope. She states she had been doing "very well" after her discharge until the above symptoms returned. She was found to have a down trending albumin at 3.2-->2.9 (prior 3.9 at last admission), Mg++ 1.6, K+ 3.2, hgb 9.7-->8.7, C diff negative, GI panel negative, blood culture negative  a 2 days, lactic acid 1.8, troponin 0.05. CT abdomen/pelvis showed diffuse colonic wall thickening c/w colitis. She has been without chest pain throughout  all of the above. She has been started on Flagyl and Zosyn. Her electrolytes are being repleted.    Past Medical History  Diagnosis Date  . Type 1 diabetes ([place])     on levemir  . Hypothyroid   . Degenerative disk disease   . Stomach ulcer   . Diverticulitis   . Syncope 01/2015  . Anxiety   . GERD (gastroesophageal reflux disease)   . History of hiatal hernia   . Cancer (HCC)     HX OF CANCER OF UTERUS   . TIA (transient ischemic attack) 02/2015  . Hypertension   . Pancreatitis   . PAF (paroxysmal atrial fibrillation) ([place]) 03/2015    a. new onset 03/2015 in setting of intractable N/V; b. on Eliquis 5 mg bid; c. CHADSVASc 4 (DM, TIA x 2, female)  . Intussusception intestine ([place]) 05/2015  . Stroke [facility])     with minimal left sided weakness  . Allergy   . Cirrhosis of liver not due to alcohol ([place]) 2016  . Gastroparesis   . Sick sinus syndrome ([place])       Most Recent Cardiac Studies: Cardiac cath 01/12/2016: Conclusion     There is mild to moderate left ventricular systolic dysfunction.  1. Minimal luminal irregularities with no evidence of obstructive coronary artery disease. 2. Mildly to Moderately reduced LV systolic function with an ejection fraction of 40% with wall motion abnormality suggestive of stress-induced cardiomyopathy. 3. Mildly elevated left ventricular end-diastolic pressure.  Recommendations: The patient has no evidence of obstructive coronary artery disease. Her presentation and cardiac catheterization is consistent with stress-induced cardiomyopathy. Continue treatment with metoprolol. Resume anticoagulation with Eliquis tomorrow if no bleeding complications. Treat underlying medical conditions.   Echo 12/28/2015: Study Conclusions  - Left ventricle: The cavity size was normal. Systolic function was  normal. The estimated ejection fraction was in the range of 60%  to 65%. Wall motion was normal; there were no regional wall  motion abnormalities.  Doppler parameters are consistent with  abnormal left ventricular relaxation (grade 1 diastolic  dysfunction). - Mitral valve: There was mild regurgitation. - Left atrium: The atrium was mildly dilated. - Right ventricle: Systolic function was normal. - Pulmonary arteries: Systolic pressure was within the normal  range.  Impressions:  - Challenging images though grossly a normal study. No source of  TIA or CVA noted.   Surgical History:  Past Surgical History  Procedure Laterality Date  . Hernia repair    . Abdominal hysterectomy    . Cholecystectomy    . Esophagogastroduodenoscopy N/A 04/04/2015    Procedure: ESOPHAGOGASTRODUODENOSCOPY (EGD);  Surgeon: Hulen Luster, MD;  Location: [facility] ENDOSCOPY;  Service: Endoscopy;  Laterality: N/A;  . Cardiac catheterization N/A 01/12/2016    Procedure: Left Heart Cath and Coronary Angiography;  Surgeon: Wellington Hampshire, MD;  Location: [place] CV LAB;  Service: Cardiovascular;  Laterality: N/A;     Home Meds: Prior to Admission medications   Medication Sig Start Date End Date Taking? Authorizing Provider  albuterol (PROVENTIL HFA;VENTOLIN HFA) 108 (90 Base) MCG/ACT inhaler Inhale 2 puffs into the lungs every 6 (six) hours as needed for wheezing or shortness of breath. 12/19/15  Yes Gladstone Lighter, MD  amLODipine (NORVASC) 5 MG tablet Take 5 mg by mouth daily.   Yes Historical Provider, MD  apixaban (ELIQUIS) 5 MG  TABS tablet Take 1 tablet (5 mg total) by mouth 2 (two) times daily. 04/07/15  Yes Bettey Costa, MD  atorvastatin (LIPITOR) 40 MG tablet Take 40 mg by mouth at bedtime.   Yes Historical Provider, MD  benzonatate (TESSALON) 200 MG capsule Take 1 capsule (200 mg total) by mouth 3 (three) times daily as needed for cough. 12/19/15  Yes Gladstone Lighter, MD  budesonide-formoterol (SYMBICORT) 160-4.5 MCG/ACT inhaler Inhale 2 puffs into the lungs 2 (two) times daily. 12/19/15  Yes Gladstone Lighter, MD  citalopram (CELEXA) 20 MG tablet  Take 20 mg by mouth daily.   Yes Historical Provider, MD  dicyclomine (BENTYL) 10 MG capsule Take 10 mg by mouth 4 (four) times daily -  before meals and at bedtime.    Yes Historical Provider, MD  insulin aspart (NOVOLOG) 100 UNIT/ML injection Inject into the skin 3 (three) times daily with meals as needed for high blood sugar. Pt uses as needed per sliding scale.   Yes Historical Provider, MD  insulin detemir (LEVEMIR) 100 UNIT/ML injection Inject 17 Units into the skin at bedtime.   Yes Historical Provider, MD  levothyroxine (SYNTHROID, LEVOTHROID) 75 MCG tablet Take 1 tablet (75 mcg total) by mouth daily before breakfast. 10/08/15  Yes Loleta Chance, MD  magnesium oxide (MAG-OX) 400 (241.3 Mg) MG tablet Take 1 tablet (400 mg total) by mouth 2 (two) times daily. 11/16/15  Yes Belkys A Regalado, MD  metoCLOPramide (REGLAN) 5 MG tablet Take 1 tablet (5 mg total) by mouth 4 (four) times daily -  before meals and at bedtime. 12/19/15  Yes Gladstone Lighter, MD  metoprolol tartrate (LOPRESSOR) 25 MG tablet Take 25 mg by mouth 2 (two) times daily.    Yes Historical Provider, MD  morphine (MS CONTIN) 15 MG 12 hr tablet Take 1 tablet (15 mg total) by mouth every 8 (eight) hours. 10/21/15  Yes Lance Bosch, MD  oxyCODONE (OXY IR/ROXICODONE) 5 MG immediate release tablet Take 5-10 mg by mouth every 6 (six) hours as needed for severe pain.   Yes Historical Provider, MD  pantoprazole (PROTONIX) 40 MG tablet Take 40 mg by mouth 2 (two) times daily before a meal.   Yes Historical Provider, MD  rifaximin (XIFAXAN) 550 MG TABS tablet Take 1 tablet (550 mg total) by mouth 2 (two) times daily. 07/11/15  Yes Theodoro Grist, MD  tiZANidine (ZANAFLEX) 4 MG tablet Take 4 mg by mouth 3 (three) times daily as needed for muscle spasms.    Yes Historical Provider, MD    Inpatient Medications:  . dicyclomine  10 mg Oral TID AC & HS  . enoxaparin (LOVENOX) injection  40 mg Subcutaneous Q24H  . insulin aspart  0-9 Units  Subcutaneous TID WC  . insulin detemir  10 Units Subcutaneous QHS  . levothyroxine  75 mcg Oral Q0600  . magnesium oxide  400 mg Oral BID  . magnesium sulfate 1 - 4 g bolus IVPB  2 g Intravenous Once  . metoCLOPramide (REGLAN) injection  5 mg Intravenous TID AC & HS  . metronidazole  500 mg Intravenous Q8H  . mometasone-formoterol  2 puff Inhalation BID  . morphine  15 mg Oral Q8H  . pantoprazole  40 mg Oral BID AC  . piperacillin-tazobactam (ZOSYN)  IV  3.375 g Intravenous Q8H  . potassium chloride  10 mEq Intravenous Q1 Hr x 4  . rifaximin  550 mg Oral BID  . sodium chloride flush  3 mL Intravenous Q12H   .  sodium chloride 75 mL/hr at 01/17/16 0510    Allergies:  Allergies  Allergen Reactions  . Hydrocodone Other (See Comments)    Pt states that this medication caused cirrhosis of the liver.    . Erythromycin Other (See Comments)    Reaction:  Fever   . Prednisone Other (See Comments)    Reaction:  Unknown   . Rosiglitazone Maleate Swelling  . Codeine Sulfate Rash  . Tetanus-Diphtheria Toxoids Td Rash and Other (See Comments)    Reaction:  Fever     Social History   Social History  . Marital Status: Married    Spouse Name: N/A  . Number of Children: N/A  . Years of Education: N/A   Occupational History  . Disabled 2nd back problems    Social History Main Topics  . Smoking status: Never Smoker   . Smokeless tobacco: Never Used  . Alcohol Use: No  . Drug Use: No  . Sexual Activity: No   Other Topics Concern  . Not on file   Social History Narrative   Lives in [place], [place] with her husband and 2 sons.     Family History  Problem Relation Age of Onset  . Hypertension Mother   . CAD Sister   . Heart attack Sister     Deceased November 06, 2014  . CAD Brother      Review of Systems: Review of Systems  Constitutional: Positive for weight loss and malaise/fatigue. Negative for fever, chills and diaphoresis.  HENT: Negative for congestion.   Eyes: Negative for  discharge and redness.  Respiratory: Negative for cough, hemoptysis, sputum production, shortness of breath and wheezing.   Cardiovascular: Negative for chest pain, palpitations, orthopnea, claudication, leg swelling and PND.  Gastrointestinal: Positive for nausea, vomiting, abdominal pain and diarrhea. Negative for heartburn, constipation, blood in stool and melena.  Genitourinary: Negative for hematuria.  Musculoskeletal: Negative for myalgias and falls.  Skin: Negative for rash.  Neurological: Positive for weakness. Negative for dizziness, tingling, tremors, sensory change, speech change, focal weakness and loss of consciousness.  Endo/Heme/Allergies: Does not bruise/bleed easily.  Psychiatric/Behavioral: Negative for substance abuse. The patient is not nervous/anxious.   All other systems reviewed and are negative.   Labs:  Recent Labs  01/15/16 0916  TROPONINI 0.05*   Lab Results  Component Value Date   WBC 3.9 01/17/2016   HGB 8.7* 01/17/2016   HCT 26.7* 01/17/2016   MCV 84.6 01/17/2016   PLT 111* 01/17/2016    Recent Labs Lab 01/17/16 0441  NA 140  K 3.2*  CL 111  CO2 24  BUN 10  CREATININE 0.55  CALCIUM 8.4*  PROT 5.7*  BILITOT 0.6  ALKPHOS 76  ALT 21  AST 23  GLUCOSE 141*   Lab Results  Component Value Date   CHOL 181 12/28/2015   HDL 55 12/28/2015   LDLCALC 105* 12/28/2015   TRIG 106 12/28/2015   Lab Results  Component Value Date   DDIMER  10/26/2007    0.24        AT THE INHOUSE ESTABLISHED CUTOFF VALUE OF 0.48 ug/mL FEU, THIS ASSAY HAS BEEN DOCUMENTED IN THE LITERATURE TO HAVE    Radiology/Studies:  Dg Chest 2 View  12/29/2015  CLINICAL DATA:  Shortness of breath.  Uterine cancer. EXAM: CHEST  2 VIEW COMPARISON:  12/27/2015 chest radiograph. FINDINGS: Low lung volumes. Stable cardiomediastinal silhouette with top-normal heart size. No pneumothorax. No pleural effusion. Stable mild-to-moderate elevation of the right hemidiaphragm. No  pulmonary  edema. Mild bibasilar atelectasis. No acute consolidative airspace disease. IMPRESSION: Low lung volumes. Stable elevation of the right hemidiaphragm with mild bibasilar atelectasis. Electronically Signed   By: Ilona Sorrel M.D.   On: 12/29/2015 12:30   Dg Chest 2 View  12/27/2015  CLINICAL DATA:  Weakness.  Cough. EXAM: CHEST  2 VIEW COMPARISON:  Chest from abdominal radiographs earlier this day at 1744 hour FINDINGS: Unchanged elevation of right hemidiaphragm. There is adjacent atelectasis or scarring at the lung bases. Cardiomediastinal contours are unchanged with stable cardiomegaly. Probable vascular congestion. No evidence pulmonary edema. Trace left pleural effusion IMPRESSION: Unchanged elevation of right hemidiaphragm. Unchanged cardiomegaly. No new abnormality is seen. Electronically Signed   By: Jeb Levering M.D.   On: 12/27/2015 23:50   Dg Abd 1 View  12/30/2015  CLINICAL DATA:  Ileus. EXAM: ABDOMEN - 1 VIEW COMPARISON:  12/29/2015. FINDINGS: Residual oral contrast and stool are seen in the ascending colon. Mild gaseous distention of remaining colon, minimally improved. Gas is seen in nondistended small bowel. IMPRESSION: Minimal improvement in colonic ileus. Electronically Signed   By: Lorin Picket M.D.   On: 12/30/2015 08:36   Dg Abd 1 View  12/29/2015  CLINICAL DATA:  Ascending colitis with septic shock.  Ileus. EXAM: ABDOMEN - 1 VIEW COMPARISON:  None. FINDINGS: Gaseous distension of the colon identified. There is been interval increase in caliber of the large bowel loops with decrease and small bowel air. Enteric contrast material remains within the right colon. IMPRESSION: 1. Imaging findings compatible with colonic ileus. Electronically Signed   By: Kerby Moors M.D.   On: 12/29/2015 08:57   Ct Head Wo Contrast  01/12/2016  CLINICAL DATA:  History of stroke. Code blue after cardiac cath today. EXAM: CT HEAD WITHOUT CONTRAST TECHNIQUE: Contiguous axial images were  obtained from the base of the skull through the vertex without intravenous contrast. COMPARISON:  December 27, 2015 FINDINGS: Paranasal sinuses, mastoid air cells, and bones are within normal limits. Extracranial soft tissues are normal. No subdural, epidural, or subarachnoid hemorrhage. No mass, mass effect or midline shift. Ventricles and sulci are normal. No acute cortical ischemia or infarct. The cerebellum, brainstem, and basal cisterns are normal. No other acute abnormalities. There is an oval region of decreased attenuation in the white matter of the left frontal lobe on series 2, image 19. This was present previously but is more conspicuous today, likely due to interval resolution of a nonacute lacunar infarct. IMPRESSION: No acute intracranial process. Electronically Signed   By: Dorise Bullion III M.D   On: 01/12/2016 15:33   Ct Head Wo Contrast  12/27/2015  CLINICAL DATA:  Abdominal pain for 4 days, nausea, vomiting, diarrhea, onset of LEFT-sided weakness at 1430 hours, history TIA, atrial fibrillation, diabetes mellitus, paroxysmal atrial fibrillation, essential hypertension, uterine cancer EXAM: CT HEAD WITHOUT CONTRAST TECHNIQUE: Contiguous axial images were obtained from the base of the skull through the vertex without intravenous contrast. COMPARISON:  11/11/2015 FINDINGS: Mild atrophy. Normal ventricular morphology. No midline shift or mass effect. Otherwise normal appearance of brain parenchyma. No intracranial hemorrhage, mass lesion, or evidence acute infarction. No extra-axial fluid collections. Visualized paranasal sinuses and mastoid air cells clear. Mild hyperostosis frontalis interna. No acute osseous findings. IMPRESSION: No acute intracranial abnormalities. Findings called to Dr. Reita Cliche on 12/27/2015 at 1703 hours. Electronically Signed   By: Lavonia Dana M.D.   On: 12/27/2015 17:04   Ct Angio Chest Pe W/cm &/or Wo Cm  01/10/2016  CLINICAL  DATA:  Tachycardia and chest pain, abdominal pain  EXAM: CT ANGIOGRAPHY CHEST CT ABDOMEN AND PELVIS WITH CONTRAST TECHNIQUE: Multidetector CT imaging of the chest was performed using the standard protocol during bolus administration of intravenous contrast. Multiplanar CT image reconstructions and MIPs were obtained to evaluate the vascular anatomy. Multidetector CT imaging of the abdomen and pelvis was performed using the standard protocol during bolus administration of intravenous contrast. CONTRAST:  100 mL Isovue 370. COMPARISON:  12/17/2015 12/27/2015 FINDINGS: CTA CHEST FINDINGS The lungs are well aerated bilaterally. No focal infiltrate or sizable effusion is seen. The previously noted infiltrates have resolved in the interval. The thoracic inlet is within normal limits. The thoracic aorta and its branches are unremarkable. No dissection or aneurysm is identified. The pulmonary artery is well visualized and demonstrates a normal branching pattern. No filling defects to suggest pulmonary emboli are identified. No significant hilar or mediastinal adenopathy is seen. The osseous structures show no acute abnormality. CT ABDOMEN and PELVIS FINDINGS The liver is shrunken with significant nodularity consistent with underlying cirrhosis. The gallbladder has been surgically removed. Some very mild perigastric varices are noted extending into the distal esophagus. No significant perisplenic varices are seen. Changes suggestive of prior fundoplication are seen in the proximal stomach. Mild ascites is noted predominately in the right lower quadrant as well as surrounding the liver superiorly. The spleen is mildly enlarged. The adrenal glands and pancreas are within normal limits. The kidneys are well visualized bilaterally with a small staghorn type calculus in the lower pole of the right kidney. No obstructive changes are seen. Aortoiliac calcifications are noted without aneurysmal dilatation. The bladder is well distended. The appendix is within normal limits. No  diverticulitis is seen. The osseous structures show no acute abnormality. Review of the MIP images confirms the above findings. IMPRESSION: No evidence of pulmonary emboli. Significant clearing of previously seen bilateral infiltrates. Changes of cirrhosis with portal hypertension, ascites and varices. Stable right renal calculus. Electronically Signed   By: Inez Catalina M.D.   On: 01/10/2016 19:42   Mr Jodene Nam Head Wo Contrast  12/29/2015  CLINICAL DATA:  60 year old female with vomiting and dizziness for several days. Initial encounter. EXAM: MRI HEAD WITHOUT CONTRAST MRA HEAD WITHOUT CONTRAST TECHNIQUE: Multiplanar, multiecho pulse sequences of the brain and surrounding structures were obtained without intravenous contrast. Angiographic images of the head were obtained using MRA technique without contrast. COMPARISON:  Head CT without contrast 12/27/2015. Brain MRI 10/04/2015. FINDINGS: MRI HEAD FINDINGS Major intracranial vascular flow voids are stable. No restricted diffusion to suggest acute infarction. No midline shift, mass effect, evidence of mass lesion, ventriculomegaly, extra-axial collection or acute intracranial hemorrhage. Cervicomedullary junction and pituitary are within normal limits. Negative visualized cervical spine. Stable gray and white matter signal throughout the brain; scattered nonspecific cerebral white matter T2 and FLAIR hyperintensity. Visible internal auditory structures appear normal. Mild bilateral mastoid fluid is stable. Negative nasopharynx. Trace paranasal sinus mucosal thickening has not significantly changed. Orbit and scalp soft tissues are within normal limits. Normal bone marrow signal. MRA HEAD FINDINGS Antegrade flow in the posterior circulation with codominant distal vertebral arteries. Normal PICA origins and vertebrobasilar junction. Normal basilar artery, AICA origins, SCA origins, and left PCA origin. Fetal type right PCA origin. Left posterior communicating artery is  diminutive or absent. Bilateral PCA branches are within normal limits. Antegrade flow in both ICA siphons. No siphon stenosis. Normal ophthalmic and right posterior communicating artery origins. Normal carotid termini, MCA and ACA origins. Anterior communicating  artery is diminutive. Visualized bilateral ACA and MCA branches are within normal limits. IMPRESSION: 1. No acute intracranial abnormality. Stable noncontrast MRI appearance of the brain since January. 2.  Negative intracranial MRA. Electronically Signed   By: Genevie Ann M.D.   On: 12/29/2015 13:46   Mr Angiogram Neck W Wo Contrast  12/29/2015  CLINICAL DATA:  60 year old female with vomiting and dizziness for several days. Initial encounter. EXAM: MRA NECK WITHOUT AND WITH CONTRAST TECHNIQUE: Multiplanar and multiecho pulse sequences of the neck were obtained without and with intravenous contrast. Angiographic images of the neck were obtained using MRA technique without and with intravenous contrast. CONTRAST:  37m MULTIHANCE GADOBENATE DIMEGLUMINE 529 MG/ML IV SOLN COMPARISON:  Brain MRI and intracranial MRA from today reported separately. FINDINGS: Precontrast time-of-flight images reveal antegrade flow in both carotid and vertebral arteries throughout the neck. Carotid bifurcations appear normal. Vertebral arteries are codominant. Post-contrast neck MRA images reveal a 3 vessel arch configuration. Tortuous proximal great vessels but no great vessel origin stenosis. Tortuous proximal right CCA. Widely patent right carotid bifurcation. Negative cervical right ICA. Tortuous proximal left CCA. Mild irregularity at the left carotid bifurcation primarily affecting the ECA origin. Widely patent left ICA origin and bulb. Negative cervical left ICA. Normal vertebral artery origins. Mildly dominant right vertebral artery throughout the neck. No vertebral artery stenosis to the vertebrobasilar junction. IMPRESSION: Negative neck MRA aside from tortuosity of the  great vessels. Electronically Signed   By: HGenevie AnnM.D.   On: 12/29/2015 14:01   Mr Brain Wo Contrast  12/29/2015  CLINICAL DATA:  60year old female with vomiting and dizziness for several days. Initial encounter. EXAM: MRI HEAD WITHOUT CONTRAST MRA HEAD WITHOUT CONTRAST TECHNIQUE: Multiplanar, multiecho pulse sequences of the brain and surrounding structures were obtained without intravenous contrast. Angiographic images of the head were obtained using MRA technique without contrast. COMPARISON:  Head CT without contrast 12/27/2015. Brain MRI 10/04/2015. FINDINGS: MRI HEAD FINDINGS Major intracranial vascular flow voids are stable. No restricted diffusion to suggest acute infarction. No midline shift, mass effect, evidence of mass lesion, ventriculomegaly, extra-axial collection or acute intracranial hemorrhage. Cervicomedullary junction and pituitary are within normal limits. Negative visualized cervical spine. Stable gray and white matter signal throughout the brain; scattered nonspecific cerebral white matter T2 and FLAIR hyperintensity. Visible internal auditory structures appear normal. Mild bilateral mastoid fluid is stable. Negative nasopharynx. Trace paranasal sinus mucosal thickening has not significantly changed. Orbit and scalp soft tissues are within normal limits. Normal bone marrow signal. MRA HEAD FINDINGS Antegrade flow in the posterior circulation with codominant distal vertebral arteries. Normal PICA origins and vertebrobasilar junction. Normal basilar artery, AICA origins, SCA origins, and left PCA origin. Fetal type right PCA origin. Left posterior communicating artery is diminutive or absent. Bilateral PCA branches are within normal limits. Antegrade flow in both ICA siphons. No siphon stenosis. Normal ophthalmic and right posterior communicating artery origins. Normal carotid termini, MCA and ACA origins. Anterior communicating artery is diminutive. Visualized bilateral ACA and MCA  branches are within normal limits. IMPRESSION: 1. No acute intracranial abnormality. Stable noncontrast MRI appearance of the brain since January. 2.  Negative intracranial MRA. Electronically Signed   By: HGenevie AnnM.D.   On: 12/29/2015 13:46   Nm Gastric Emptying  01/16/2016  CLINICAL DATA:  Nausea and vomiting for 4 days. EXAM: NUCLEAR MEDICINE GASTRIC EMPTYING SCAN TECHNIQUE: After oral ingestion of radiolabeled meal, sequential abdominal images were obtained for 4 hours. Percentage of activity emptying the stomach  was calculated at 1 hour, 2 hour, 3 hour, and 4 hours. RADIOPHARMACEUTICALS:  2.02 mCi Tc-62mMDP labeled sulfur colloid orally COMPARISON:  None. FINDINGS: Expected location of the stomach in the left upper quadrant. Ingested meal empties the stomach gradually over the course of the study. 44% emptied at 1 hr ( normal >= 10%) 51% emptied at 2 hr ( normal >= 40%) 70% emptied at 3 hr ( normal >= 70%) 78% emptied at 4 hr ( normal >= 90%) IMPRESSION: Delayed gastric emptying study. Electronically Signed   By: JMarijo Conception M.D.   On: 01/16/2016 15:07   Ct Abdomen Pelvis W Contrast  01/15/2016  CLINICAL DATA:  Nausea, vomiting, generalized abdominal pain, seen on 01/10/2016 for similar indications, history type II diabetes mellitus, chronic superficial gastritis, hypertension, chronic anemia, uterine cancer, cirrhosis, prior pancreatitis EXAM: CT ABDOMEN AND PELVIS WITH CONTRAST TECHNIQUE: Multidetector CT imaging of the abdomen and pelvis was performed using the standard protocol following bolus administration of intravenous contrast. Sagittal and coronal MPR images reconstructed from axial data set. CONTRAST:  1031mISOVUE-300 IOPAMIDOL (ISOVUE-300) INJECTION 61% IV. Dilute oral contrast. COMPARISON:  01/10/2016 FINDINGS: Bibasilar atelectasis dependently. Nodular cirrhotic liver without focal mass. Post cholecystectomy. Nonobstructing 9 mm calculus inferior pole RIGHT kidney. Tiny nonobstructing  mid LEFT renal calculus. Tiny RIGHT renal cyst. Remainder of liver, spleen, pancreas, kidneys, and adrenal glands normal. Colonic wall thickening extending from cecum through descending colon compatible with colitis. Appendix not visualized. Distal small bowel on opacified and incompletely distended with suboptimal assessment of wall thickness. Focal wall thickening of stomach at gastroesophageal junction question prior fundoplication. Remainder of stomach and small bowel loops unremarkable. Normal appearing bladder and ureters. Uterus surgically absent with nonvisualization of ovaries. Significant ascites. Recanalization of the umbilical vein and few perigastric varices noted. Scattered atherosclerotic calcifications with major vessels patent. No mass, adenopathy, free air, hernia, or acute bone lesion. IMPRESSION: Cirrhotic liver with ascites. Nonobstructing renal calculi. Diffuse colonic wall thickening from cecum through descending colon compatible with colitis ; differential diagnosis includes infection, inflammatory bowel disease, ischemia much less likely. Electronically Signed   By: MaLavonia Dana.D.   On: 01/15/2016 12:53   Ct Abdomen Pelvis W Contrast  01/10/2016  CLINICAL DATA:  Tachycardia and chest pain, abdominal pain EXAM: CT ANGIOGRAPHY CHEST CT ABDOMEN AND PELVIS WITH CONTRAST TECHNIQUE: Multidetector CT imaging of the chest was performed using the standard protocol during bolus administration of intravenous contrast. Multiplanar CT image reconstructions and MIPs were obtained to evaluate the vascular anatomy. Multidetector CT imaging of the abdomen and pelvis was performed using the standard protocol during bolus administration of intravenous contrast. CONTRAST:  100 mL Isovue 370. COMPARISON:  12/17/2015 12/27/2015 FINDINGS: CTA CHEST FINDINGS The lungs are well aerated bilaterally. No focal infiltrate or sizable effusion is seen. The previously noted infiltrates have resolved in the interval.  The thoracic inlet is within normal limits. The thoracic aorta and its branches are unremarkable. No dissection or aneurysm is identified. The pulmonary artery is well visualized and demonstrates a normal branching pattern. No filling defects to suggest pulmonary emboli are identified. No significant hilar or mediastinal adenopathy is seen. The osseous structures show no acute abnormality. CT ABDOMEN and PELVIS FINDINGS The liver is shrunken with significant nodularity consistent with underlying cirrhosis. The gallbladder has been surgically removed. Some very mild perigastric varices are noted extending into the distal esophagus. No significant perisplenic varices are seen. Changes suggestive of prior fundoplication are seen in the proximal stomach.  Mild ascites is noted predominately in the right lower quadrant as well as surrounding the liver superiorly. The spleen is mildly enlarged. The adrenal glands and pancreas are within normal limits. The kidneys are well visualized bilaterally with a small staghorn type calculus in the lower pole of the right kidney. No obstructive changes are seen. Aortoiliac calcifications are noted without aneurysmal dilatation. The bladder is well distended. The appendix is within normal limits. No diverticulitis is seen. The osseous structures show no acute abnormality. Review of the MIP images confirms the above findings. IMPRESSION: No evidence of pulmonary emboli. Significant clearing of previously seen bilateral infiltrates. Changes of cirrhosis with portal hypertension, ascites and varices. Stable right renal calculus. Electronically Signed   By: Inez Catalina M.D.   On: 01/10/2016 19:42   Ct Abdomen Pelvis W Contrast  12/27/2015  CLINICAL DATA:  Vomiting and dizziness. Abdominal pain. Weakness. Lightheadedness. Cirrhosis. EXAM: CT ABDOMEN AND PELVIS WITH CONTRAST TECHNIQUE: Multidetector CT imaging of the abdomen and pelvis was performed using the standard protocol following  bolus administration of intravenous contrast. CONTRAST:  127m ISOVUE-300 IOPAMIDOL (ISOVUE-300) INJECTION 61% COMPARISON:  Multiple exams, including 12/16/2015 CT scan FINDINGS: Lower chest: Mild interstitial accentuation in both lower lobes, similar to 12/16/2015. Cardiomegaly with left ventricular hypertrophy. Trace left pleural effusion. Trace pericardial effusion. Small uphill varices adjacent to the distal esophagus. Hepatobiliary: Advanced cirrhosis. No focal liver mass seen. Cholecystectomy. Pancreas: Unremarkable Spleen: Unremarkable Adrenals/Urinary Tract: Adrenal glands normal. Right kidney lower pole 9 mm calculus, not appreciably changed. Small hypodense lesions of the right kidney are statistically likely to be cysts but technically too small to characterize. No hydronephrosis, ureteral calculus, or bladder calculus seen. Stomach/Bowel: Wall thickening of the stomach along the gastric cardia, somewhat resembling a fundoplication, correlate with operative history. Several mildly dilated loops of jejunum in the left abdomen, without a transition point to suggest obstruction. Equivocal wall thickening in the ascending colon. Vascular/Lymphatic: Aortoiliac atherosclerotic vascular disease. Gastric varices. Splenorenal shunting. Portal vein and splenic vein patent. No pathologic adenopathy identified. Reproductive: Uterus absent.  Ovaries not well seen. Other: Considerable ascites, increased from the exam 11 days ago. Increased mesenteric and subcutaneous edema. Musculoskeletal: Healing fractures the right anterior fourth, fifth, sixth, seventh, and eighth ribs with associated rib sclerosis, not appreciably changed from 12/16/2015. IMPRESSION: 1. Compared to the exam from 11 days ago, there is worsening third spacing of fluid with diffuse mesenteric and subcutaneous edema along with increase in ascites. 2. Cirrhosis. 3. Trace left pleural effusion and trace pericardial effusion. 4. Healing anterior fourth  through eighth rib fractures on the right. 5. Cardiomegaly with left ventricular hypertrophy. 6. The uphill esophageal varices and gastric varices. Splenorenal shunting. 7. Nonobstructive right kidney lower pole 9 mm calculus. 8. Wall thickening along the gastric cardia, query prior fundoplication. 9. Equivocal wall thickening in the ascending colon, possibly from third spacing of fluid or mild proximal colitis. 10.  Aortoiliac atherosclerotic vascular disease. Electronically Signed   By: WVan ClinesM.D.   On: 12/27/2015 21:19   Dg Chest Portable 1 View  01/10/2016  CLINICAL DATA:  60year old female with tachycardia and chest pain for 3 days with shortness of breath and weakness. Initial encounter. EXAM: PORTABLE CHEST 1 VIEW COMPARISON:  12/29/2015 and earlier. FINDINGS: Portable AP upright view at 1532 hours. Low but improved lung volumes. Stable cardiac size and mediastinal contours. Allowing for portable technique, the lungs are clear. No pneumothorax or pleural effusion. Calcified aortic atherosclerosis. IMPRESSION: No acute cardiopulmonary abnormality. Electronically  Signed   By: Genevie Ann M.D.   On: 01/10/2016 15:46   Dg Abd Acute W/chest  12/27/2015  CLINICAL DATA:  Upper abdominal pain for the past 4 days. History of atrial fibrillation. EXAM: DG ABDOMEN ACUTE W/ 1V CHEST COMPARISON:  12/17/2015; 12/16/2015 FINDINGS: Grossly unchanged enlarged cardiac silhouette and mediastinal contours given persistently reduced lung volumes. Minimal bilateral infrahilar opacities favored to represent atelectasis. Mild pulmonary venous congestion without frank evidence of edema. No pleural effusion or pneumothorax. Mild gas distention of multiple loops of small bowel without definitive evidence of enteric obstruction. Unremarkable colonic stool burden. No pneumoperitoneum, pneumatosis or portal venous gas. Indeterminate punctate (approximately 5 mm) opacity overlying the right mid abdomen. Several phleboliths  overlie the lower pelvis bilaterally. Mild to moderate scoliotic curvature of the thoracolumbar spine with dominant caudal component convex to the left. No acute osseous abnormalities. IMPRESSION: 1. Cardiomegaly and pulmonary venous congestion without definitive evidence of acute cardiopulmonary disease on this hypoventilated examination. 2. Nonspecific mild gaseous distention of several loops of small bowel without definite evidence of enteric obstruction. 3. Indeterminate punctate (approximately 5 mm) opacity overlies the right mid abdomen, while potentially radiopaque ingested debris, potentially renal stone could have a similar appearance. Clinical correlation is advised. Further evaluation with noncontrast abdominal CT could be performed as indicated. Electronically Signed   By: Sandi Mariscal M.D.   On: 12/27/2015 18:12   Dg Abd Portable 1v  12/28/2015  CLINICAL DATA:  Distended abdomen EXAM: PORTABLE ABDOMEN - 1 VIEW COMPARISON:  CT 12/27/2015 FINDINGS: Contrast in the right colon from prior CT. Gas in large and small bowel compatible with ileus. Negative for bowel obstruction Contrast in urinary bladder from recent CT scan. Lumbar levoscoliosis. IMPRESSION: Mild ileus. Nonobstructive bowel gas pattern. Contrast in the right colon from CT yesterday. Electronically Signed   By: Franchot Gallo M.D.   On: 12/28/2015 09:37    EKG: sinus tachycardia, 116 bpm, inferolateral TWI  Weights: Filed Weights   01/15/16 0839 01/15/16 1637  Weight: 130 lb (58.968 kg) 136 lb 4.8 oz (61.825 kg)     Physical Exam: Blood pressure 155/76, pulse 96, temperature 99 F (37.2 C), temperature source Oral, resp. rate 20, height 5' 3"  (1.6 m), weight 136 lb 4.8 oz (61.825 kg), SpO2 100 %. Body mass index is 24.15 kg/(m^2). General: Well developed, well nourished, in no acute distress. Head: Normocephalic, atraumatic, sclera non-icteric, no xanthomas, nares are without discharge.  Neck: Negative for carotid bruits. JVD  not elevated. Lungs: Clear bilaterally to auscultation without wheezes, rales, or rhonchi. Breathing is unlabored. Heart: Tachycardic with S1 S2. No murmurs, rubs, or gallops appreciated. Abdomen: Soft, non-tender, non-distended with normoactive bowel sounds. No hepatomegaly. No rebound/guarding. No obvious abdominal masses. Msk:  Strength and tone appear normal for age. Extremities: No clubbing or cyanosis. No edema.  Distal pedal pulses are 2+ and equal bilaterally. Neuro: Alert and oriented X 3. No facial asymmetry. No focal deficit. Moves all extremities spontaneously. Psych:  Responds to questions appropriately with a normal affect.    Assessment and Plan:   1. Cardiac clearance: -Patient recently underwent cardiac catheterization on 5/4 in the setting of elevated troponin in the setting of recurrent nausea, vomiting, and diarrhea. She was found to have nonobstructive coronary artery disease. Wall motion abnormalities c/w stress-induced cardiomyopathy. Medical management was recommended. She has not had any symptoms concerning for angina.  -She would be cleared at high-risk for non-cardiac procedure per modified [name] Criteria, giving her an estimated rate  of MI, pulmonary edema, Vfib, cardiac arrest, or CHB at >11%.   2. Recurrent nausea, vomiting, diarrhea/colitis/diabetic gastroparesis: -She is for EGD and sigmoidoscopy on 5/10 per GI -Continue management per GI  3. PAF: -Currently in sinus tachycardia with HR of 122 bpm given her Lopressor has not been continued (likely rebound tachycardia), nausea, vomiting, diarrhea, and volume depletion  -Her Eliquis has been held since admission -Currently on DVT Lovenox dosing, would increase to therapeutic dosing for stroke prevention if ok with MD -Her at home Lopressor has not been continued for rate control, restart 25 mg bid -CHADS2VASc at least 5 (HTN, diabetes, TIA/stroke x 2, female) -Restart Eliquis when able per GI s/p procedures  above  4. Stress-induced cardiomyopathy: -Stable -Has felt well from a cardiac standpoint -Restart Lopressor as above -Can recheck echo as an outpatient   5. Hypomagnesemia: -Currently being repleted to 2.0 via IV  6. Hypokalemia: -Currently being repleted to 4.0 via IV  7. Hypoalbuminemia: -In the setting of decreased PO appetite and vomiting/diarrhea -Not supplementing with Boost/Ensure, recommend she start  8. Anemia: -Stable -Monitor -Per IM  9. IDDM: -Per IM  10. History of SVT/respiratory failure: -Last admission s/p cardiac cath -came to on her own with stable vitals and independent breathing  11. History of tachy-brady syndrome: -Has previously been felt to not be a PPM candidate -None seen on tele -Needs outpatient EP eval  12. History of stroke with residual right-sided weakness: -Stable -On Eliquis as above   13. HTN: -Stable   Signed, Christell Faith, PA-C Pager: [phone] 01/17/2016, 1:31 PM

## 2016-01-17 NOTE — Care Management Important Message (Signed)
Important Message  Patient Details  Name: [name] MRN: [id number] Date of Birth: 1956-08-19   Medicare Important Message Given:  Yes    Juliann Pulse A Oreatha Fabry 01/17/2016, 1:52 PM

## 2016-01-17 NOTE — Anesthesia Preprocedure Evaluation (Signed)
Anesthesia Evaluation  Patient identified by MRN, date of birth, ID band Patient awake    Reviewed: Allergy & Precautions, H&P , NPO status , Patient's Chart, lab work & pertinent test results, reviewed documented beta blocker date and time   History of Anesthesia Complications Negative for: history of anesthetic complications  Airway Mallampati: II  TM Distance: >3 FB Neck ROM: full    Dental no notable dental hx. (+) Edentulous Upper, Edentulous Lower   Pulmonary shortness of breath and with exertion, neg sleep apnea, pneumonia, resolved, neg COPD, neg recent URI,    Pulmonary exam normal breath sounds clear to auscultation       Cardiovascular Exercise Tolerance: Poor hypertension, + angina with exertion + CAD and + Past MI  (-) Cardiac Stents and (-) CABG negative cardio ROS Normal cardiovascular exam+ dysrhythmias Atrial Fibrillation (-) Valvular Problems/Murmurs Rhythm:regular Rate:Normal     Neuro/Psych PSYCHIATRIC DISORDERS (dysthymic disorder) TIA Neuromuscular disease CVA (left sided weakness), Residual Symptoms    GI/Hepatic hiatal hernia, PUD, GERD  ,(+) Cirrhosis       ,   Endo/Other  diabetes, Insulin DependentHypothyroidism   Renal/GU negative Renal ROS  negative genitourinary   Musculoskeletal   Abdominal   Peds  Hematology  (+) Blood dyscrasia, anemia ,   Anesthesia Other Findings Past Medical History:   Type 1 diabetes ([place])                                          Comment:on levemir   Hypothyroid                                                  Degenerative disk disease                                    Stomach ulcer                                                Diverticulitis                                               Syncope                                         01/2015       Anxiety                                                      GERD (gastroesophageal reflux disease)                        History of hiatal hernia  Cancer ([place])                                                   Comment:HX OF CANCER OF UTERUS    TIA (transient ischemic attack)                 02/2015       Hypertension                                                 Pancreatitis                                                 PAF (paroxysmal atrial fibrillation) ([place])      03/2015        Comment:a. new onset 03/2015 in setting of intractable               N/V; b. on Eliquis 5 mg bid; c. CHADSVASc 4               (DM, TIA x 2, female)   Intussusception intestine ([place])                 05/2015      Stroke ([place])                                                   Comment:with minimal left sided weakness   Allergy                                                      Cirrhosis of liver not due to alcohol ([place])     2016         Gastroparesis                                                Sick sinus syndrome ([facility])                                    Reproductive/Obstetrics negative OB ROS                             Anesthesia Physical Anesthesia Plan  ASA: III  Anesthesia Plan: General   Post-op Pain Management:    Induction:   Airway Management Planned:   Additional Equipment:   Intra-op Plan:   Post-operative Plan:   Informed Consent: I have reviewed the patients History and Physical, chart, labs and discussed the procedure including the risks, benefits and alternatives for the proposed anesthesia with  the patient or authorized representative who has indicated his/her understanding and acceptance.   Dental Advisory Given  Plan Discussed with: Anesthesiologist, CRNA and Surgeon  Anesthesia Plan Comments:         Anesthesia Quick Evaluation

## 2016-01-17 NOTE — Progress Notes (Signed)
Patient requested prayer, scripture support from chaplain prior to procedure this morning. Husband has no transportation to be here to support patient and she is alone.  Requested chaplain follow up this afternoon as she prepares for test results.

## 2016-01-17 NOTE — Progress Notes (Signed)
[place] [facility] [place] NAME: [name]    MR#:  [id number]  DATE OF BIRTH:  [birth year]  SUBJECTIVE:  CHIEF COMPLAINT:  Patient is resting comfortably. EGD and sigmoidoscopy was scheduled for today and got canceled as patient was not cleared by anesthesiology given the recent history of non-STEMI  REVIEW OF SYSTEMS:  CONSTITUTIONAL: No fever, fatigue or weakness.  EYES: No blurred or double vision.  EARS, NOSE, AND THROAT: No tinnitus or ear pain.  RESPIRATORY: No cough, shortness of breath, wheezing or hemoptysis.  CARDIOVASCULAR: No chest pain, orthopnea, edema.  GASTROINTESTINAL: No nausea, vomiting, Reporting diarrhea and reporting from below umbilical abdominal pain GENITOURINARY: No dysuria, hematuria.  ENDOCRINE: No polyuria, nocturia,  HEMATOLOGY: No anemia, easy bruising or bleeding SKIN: No rash or lesion. MUSCULOSKELETAL: No joint pain or arthritis.   NEUROLOGIC: No tingling, numbness, weakness.  PSYCHIATRY: No anxiety or depression.   DRUG ALLERGIES:   Allergies  Allergen Reactions  . Hydrocodone Other (See Comments)    Pt states that this medication caused cirrhosis of the liver.    . Erythromycin Other (See Comments)    Reaction:  Fever   . Prednisone Other (See Comments)    Reaction:  Unknown   . Rosiglitazone Maleate Swelling  . Codeine Sulfate Rash  . Tetanus-Diphtheria Toxoids Td Rash and Other (See Comments)    Reaction:  Fever     VITALS:  Blood pressure 136/74, pulse 109, temperature 98.8 F (37.1 C), temperature source Oral, resp. rate 20, height 5' 3"  (1.6 m), weight 61.825 kg (136 lb 4.8 oz), SpO2 100 %.  PHYSICAL EXAMINATION:  GENERAL:  60 y.o.-year-old patient lying in the bed with no acute distress.  EYES: Pupils equal, round, reactive to light and accommodation. No scleral icterus. Extraocular muscles intact.  HEENT: Head atraumatic, normocephalic. Oropharynx and nasopharynx clear.  NECK:   Supple, no jugular venous distention. No thyroid enlargement, no tenderness.  LUNGS: Normal breath sounds bilaterally, no wheezing, rales,rhonchi or crepitation. No use of accessory muscles of respiration.  CARDIOVASCULAR: S1, S2 normal. No murmurs, rubs, or gallops.  ABDOMEN: Soft, Lower abdominal tenderness is present but no rebound tenderness, nondistended. Bowel sounds present. No organomegaly or mass.  EXTREMITIES: No pedal edema, cyanosis, or clubbing.  NEUROLOGIC: Cranial nerves II through XII are intact. Muscle strength 5/5 in all extremities. Sensation intact. Gait not checked.  PSYCHIATRIC: The patient is alert and oriented x 3.  SKIN: No obvious rash, lesion, or ulcer.    LABORATORY PANEL:   CBC  Recent Labs Lab 01/17/16 0441  WBC 3.9  HGB 8.7*  HCT 26.7*  PLT 111*   ------------------------------------------------------------------------------------------------------------------  Chemistries   Recent Labs Lab 01/17/16 0441  NA 140  K 3.2*  CL 111  CO2 24  GLUCOSE 141*  BUN 10  CREATININE 0.55  CALCIUM 8.4*  MG 1.6*  AST 23  ALT 21  ALKPHOS 76  BILITOT 0.6   ------------------------------------------------------------------------------------------------------------------  Cardiac Enzymes  Recent Labs Lab 01/15/16 0916  TROPONINI 0.05*   ------------------------------------------------------------------------------------------------------------------  RADIOLOGY:  Nm Gastric Emptying  01/16/2016  CLINICAL DATA:  Nausea and vomiting for 4 days. EXAM: NUCLEAR MEDICINE GASTRIC EMPTYING SCAN TECHNIQUE: After oral ingestion of radiolabeled meal, sequential abdominal images were obtained for 4 hours. Percentage of activity emptying the stomach was calculated at 1 hour, 2 hour, 3 hour, and 4 hours. RADIOPHARMACEUTICALS:  2.02 mCi Tc-51mMDP labeled sulfur colloid orally COMPARISON:  None. FINDINGS: Expected location of the  stomach in the left upper quadrant.  Ingested meal empties the stomach gradually over the course of the study. 44% emptied at 1 hr ( normal >= 10%) 51% emptied at 2 hr ( normal >= 40%) 70% emptied at 3 hr ( normal >= 70%) 78% emptied at 4 hr ( normal >= 90%) IMPRESSION: Delayed gastric emptying study. Electronically Signed   By: Marijo Conception, M.D.   On: 01/16/2016 15:07    EKG:   Orders placed or performed during the hospital encounter of 01/15/16  . ED EKG  . ED EKG  . ED EKG  . ED EKG    ASSESSMENT AND PLAN:    [name] is a 60 y.o. female with a known history of multiple medical problems including insulin-dependent diabetes mellitus, chronic abdominal pain, gastroparesis, chronic anemia, paroxysmal atrial fibrillation on eliquis, Tachy Brady syndrome, nonalcoholic liver cirrhosis, prior CVA with minimal left-sided weakness presents to the hospital 3 days after recent discharge her to worsening nausea, vomiting and abdominal pain.  #1 diffuse colitis- recurrent diffuse colitis\ - CT abd done on adm, last colonoscopy in Feb 2016- by Dr. Candace Cruise- normal - on zosyn and flagyl for now - liquid diet, IV fluids  -Appreciate Gi recommendations -gastric emptying study With  delayed emptying and small bowel follow-through is normal stool and for C. difficile toxin is negative , occult blood are pending - cleared by cardiology Dr. Donivan Scull group for EGD and sigmoidoscopy in a.m., which were canceled today as patient was not cleared by anesthesiology in view of recent non-STEMI -  #2 diabetic gastroparesis- change reglan to IV for now -GI Is considering EGD and sigmoidoscopy in a.m.  - gastric emptying study with delayed gastric emptying  - on liquid diet, IV fluids, anti emetics - remote h/o Nissen's fundoplication and ringed esophagus, followed [facility] at [facility] in past  #3 hypotension- Hypovolemic hypotension, IV fluids and monitor - Improving and not symptomatic - no indication for pressors yet - hold metoprolol. Ok for  MAP>65 or SBP>90  #4 diabetes mellitus- levemir dose decreased as patient only on a liquid diet. -Added sliding scale insulin  #5 atrial fibrillation with frequent arrhythmias-follows with [facility] cardiology. Currently in NSR. Hold metoprolol due to hypotension now Recent cardiac cath with minimal disease last week eliquis held for  [facility] tomorrow ,can resume tomorrow after procedure if no contraindications    #6 chronic abdominal pain-continue MS Contin and also morphine for breakthrough pain medication  #7 nonalcoholic liver cirrhosis-lactulose is discontinued due to her diarrhea. Continue rifaximin.  #8 GERD-continue Protonix twice a day  #9 DVT prophylaxis-we'll start on Lovenox while eliquis is on hold     All the records are reviewed and case discussed with Care Management/Social Workerr. Management plans discussed with the patient, AND SHE is in agreement.  CODE STATUS: DNR  TOTAL TIME TAKING CARE OF THIS PATIENT: 35 minutes.   POSSIBLE D/C IN 2-3 DAYS, DEPENDING ON CLINICAL CONDITION.   Nicholes Mango M.D on 01/17/2016 at 1:56 PM  Between 7am to 6pm - Pager - [phone] After 6pm go to [email or web address] - password [email or web address] [facility]  [facility]  Office  [phone]  CC: Primary care physician; [place] Pink, MD

## 2016-01-18 ENCOUNTER — Encounter
Admission: EM | Disposition: A | Discharge status: Home / Self Care | Payer: Self-pay | Source: Home / Self Care | Attending: Internal Medicine

## 2016-01-18 ENCOUNTER — Ambulatory Visit: Payer: Commercial Managed Care - HMO

## 2016-01-18 ENCOUNTER — Inpatient Hospital Stay: Payer: Commercial Managed Care - HMO | Admitting: Anesthesiology

## 2016-01-18 ENCOUNTER — Encounter: Payer: Self-pay | Admitting: *Deleted

## 2016-01-18 DIAGNOSIS — T182XXA Foreign body in stomach, initial encounter: Secondary | ICD-10-CM | POA: Insufficient documentation

## 2016-01-18 DIAGNOSIS — R198 Other specified symptoms and signs involving the digestive system and abdomen: Secondary | ICD-10-CM | POA: Insufficient documentation

## 2016-01-18 DIAGNOSIS — R933 Abnormal findings on diagnostic imaging of other parts of digestive tract: Secondary | ICD-10-CM

## 2016-01-18 DIAGNOSIS — Z9889 Other specified postprocedural states: Secondary | ICD-10-CM

## 2016-01-18 DIAGNOSIS — R1013 Epigastric pain: Secondary | ICD-10-CM | POA: Insufficient documentation

## 2016-01-18 DIAGNOSIS — R197 Diarrhea, unspecified: Secondary | ICD-10-CM | POA: Insufficient documentation

## 2016-01-18 DIAGNOSIS — K2970 Gastritis, unspecified, without bleeding: Principal | ICD-10-CM

## 2016-01-18 HISTORY — PX: ESOPHAGOGASTRODUODENOSCOPY (EGD) WITH PROPOFOL: SHX5813

## 2016-01-18 HISTORY — PX: FLEXIBLE SIGMOIDOSCOPY: SHX5431

## 2016-01-18 LAB — BASIC METABOLIC PANEL
Anion gap: 5 (ref 5–15)
BUN: 8 mg/dL (ref 6–20)
CALCIUM: 8.4 mg/dL — AB (ref 8.9–10.3)
CO2: 25 mmol/L (ref 22–32)
CREATININE: 0.61 mg/dL (ref 0.44–1.00)
Chloride: 111 mmol/L (ref 101–111)
GLUCOSE: 92 mg/dL (ref 65–99)
Potassium: 3.9 mmol/L (ref 3.5–5.1)
Sodium: 141 mmol/L (ref 135–145)

## 2016-01-18 LAB — CBC
HCT: 28.3 % — ABNORMAL LOW (ref 35.0–47.0)
Hemoglobin: 9.1 g/dL — ABNORMAL LOW (ref 12.0–16.0)
MCH: 28 pg (ref 26.0–34.0)
MCHC: 32.1 g/dL (ref 32.0–36.0)
MCV: 87.2 fL (ref 80.0–100.0)
PLATELETS: 125 10*3/uL — AB (ref 150–440)
RBC: 3.25 MIL/uL — ABNORMAL LOW (ref 3.80–5.20)
RDW: 16.4 % — ABNORMAL HIGH (ref 11.5–14.5)
WBC: 4.2 10*3/uL (ref 3.6–11.0)

## 2016-01-18 LAB — GLUCOSE, CAPILLARY
GLUCOSE-CAPILLARY: 88 mg/dL (ref 65–99)
GLUCOSE-CAPILLARY: 82 mg/dL (ref 65–99)
GLUCOSE-CAPILLARY: 84 mg/dL (ref 65–99)
GLUCOSE-CAPILLARY: 115 mg/dL — AB (ref 65–99)
GLUCOSE-CAPILLARY: 146 mg/dL — AB (ref 65–99)
Glucose-Capillary: 121 mg/dL — ABNORMAL HIGH (ref 65–99)
Glucose-Capillary: 91 mg/dL (ref 65–99)

## 2016-01-18 LAB — MAGNESIUM: MAGNESIUM: 1.9 mg/dL (ref 1.7–2.4)

## 2016-01-18 SURGERY — ESOPHAGOGASTRODUODENOSCOPY (EGD) WITH PROPOFOL
Anesthesia: General

## 2016-01-18 MED ORDER — ATORVASTATIN CALCIUM 20 MG PO TABS
40.0000 mg | ORAL_TABLET | Freq: Every day | ORAL | Status: DC
  Administered 2016-01-18: 40 mg via ORAL
  Filled 2016-01-18: qty 2

## 2016-01-18 MED ORDER — APIXABAN 5 MG PO TABS
5.0000 mg | ORAL_TABLET | Freq: Two times a day (BID) | ORAL | Status: DC
  Administered 2016-01-18 – 2016-01-19 (×2): 5 mg via ORAL
  Filled 2016-01-18 (×2): qty 1

## 2016-01-18 MED ORDER — PHENYLEPHRINE HCL 10 MG/ML IJ SOLN
INTRAMUSCULAR | Status: DC | PRN
  Administered 2016-01-18: 50 ug via INTRAVENOUS

## 2016-01-18 MED ORDER — PROPOFOL 500 MG/50ML IV EMUL
INTRAVENOUS | Status: DC | PRN
  Administered 2016-01-18: 75 ug/kg/min via INTRAVENOUS

## 2016-01-18 MED ORDER — PROPOFOL 10 MG/ML IV BOLUS
INTRAVENOUS | Status: DC | PRN
  Administered 2016-01-18 (×3): 10 mg via INTRAVENOUS

## 2016-01-18 MED ORDER — LACTATED RINGERS IV SOLN
INTRAVENOUS | Status: DC | PRN
Start: 2016-01-18 — End: 2016-01-18
  Administered 2016-01-18: 12:00:00 via INTRAVENOUS

## 2016-01-18 NOTE — Op Note (Signed)
[facility] Gastroenterology Patient Name: [name] Procedure Date: 01/18/2016 11:38 AM MRN: [id number] Account #: [id number] Date of Birth: 03-Jan-1956 Admit Type: Inpatient Age: 60 Room: [facility] ENDO ROOM 4 Gender: Female Note Status: Finalized Procedure:            Flexible Sigmoidoscopy Indications:          Diarrhea, Abnormal CT of the GI tract Providers:            Lucilla Lame, MD Referring MD:         Irven Easterly. Kary Kos, MD (Referring MD) Complications:        No immediate complications. Procedure:            Pre-Anesthesia Assessment:                       - Prior to the procedure, a History and Physical was                        performed, and patient medications and allergies were                        reviewed. The patient's tolerance of previous                        anesthesia was also reviewed. The risks and benefits of                        the procedure and the sedation options and risks were                        discussed with the patient. All questions were                        answered, and informed consent was obtained. Prior                        Anticoagulants: The patient has taken no previous                        anticoagulant or antiplatelet agents. ASA Grade                        Assessment: II - A patient with mild systemic disease.                        After reviewing the risks and benefits, the patient was                        deemed in satisfactory condition to undergo the                        procedure.                       After obtaining informed consent, the scope was passed                        under direct vision. The Colonoscope was introduced  through the mouth, and advanced to the the left                        transverse colon. After obtaining informed consent, the                        scope was passed under direct vision.The flexible                        sigmoidoscopy was  accomplished without difficulty. The                        patient tolerated the procedure well. The quality of                        the bowel preparation was excellent. Findings:      The perianal and digital rectal examinations were normal.      Multiple small-mouthed diverticula were found in the sigmoid colon.      Random biopsies were obtained with cold forceps for histology randomly       in the sigmoid colon, in the descending colon and in the transverse       colon. Impression:           - Diverticulosis in the sigmoid colon.                       - Random biopsies were obtained in the sigmoid colon,                        in the descending colon and in the transverse colon. Recommendation:       - Await pathology results. Procedure Code(s):    --- Professional ---                       [phone], Sigmoidoscopy, flexible; with biopsy, single or                        multiple Diagnosis Code(s):    --- Professional ---                       R93.3, Abnormal findings on diagnostic imaging of other                        parts of digestive tract                       R19.7, Diarrhea, unspecified CPT copyright 2016 American Medical Association. All rights reserved. The codes documented in this report are preliminary and upon coder review may  be revised to meet current compliance requirements. Lucilla Lame, MD 01/18/2016 12:11:57 PM This report has been signed electronically. Number of Addenda: 0 Note Initiated On: 01/18/2016 11:38 AM      [facility]

## 2016-01-18 NOTE — Plan of Care (Signed)
Problem: Pain Managment: Goal: General experience of comfort will improve Outcome: Progressing Oxycodone given once for epigastric pain with improvement.  Problem: Physical Regulation: Goal: Ability to maintain clinical measurements within normal limits will improve Outcome: Progressing Pt had EGD and flex sig today. Currently on carb modified diet. Tolerates diet.  VSS. O2 sats in the high 90's on room air.

## 2016-01-18 NOTE — Transfer of Care (Signed)
Immediate Anesthesia Transfer of Care Note  Patient: [name]  Procedure(s) Performed: Procedure(s): ESOPHAGOGASTRODUODENOSCOPY (EGD) WITH PROPOFOL (N/A) FLEXIBLE SIGMOIDOSCOPY (N/A)  Patient Location: PACU and Endoscopy Unit  Anesthesia Type:General  Level of Consciousness: awake, alert  and oriented  Airway & Oxygen Therapy: Patient Spontanous Breathing and Patient connected to nasal cannula oxygen  Post-op Assessment: Report given to RN and Post -op Vital signs reviewed and stable  Post vital signs: Reviewed and stable  Last Vitals:  Filed Vitals:   01/18/16 0432 01/18/16 0839  BP: 143/75 146/79  Pulse: 69 80  Temp: 36.7 C   Resp: 20 20    Last Pain:  Filed Vitals:   01/18/16 1042  PainSc: Asleep      Patients Stated Pain Goal: 2 ([id number])  Complications: No apparent anesthesia complications

## 2016-01-18 NOTE — Progress Notes (Signed)
[place] [facility] [place] NAME: [name]    MRN#:  [id number]  DATE OF BIRTH:  [birth year]  SUBJECTIVE:  Hospital Day: 3 days [name] is a 60 y.o. female presenting with Abdominal Pain and Nausea .   Overnight events: No overnight events Interval Events: Patient states actually has some improvement in abdominal pain and nausea and diarrhea  REVIEW OF SYSTEMS:  CONSTITUTIONAL: No fever, fatigue or weakness.  EYES: No blurred or double vision.  EARS, NOSE, AND THROAT: No tinnitus or ear pain.  RESPIRATORY: No cough, shortness of breath, wheezing or hemoptysis.  CARDIOVASCULAR: No chest pain, orthopnea, edema.  GASTROINTESTINAL: Positive nausea,  diarrhea or abdominal pain.  GENITOURINARY: No dysuria, hematuria.  ENDOCRINE: No polyuria, nocturia,  HEMATOLOGY: No anemia, easy bruising or bleeding SKIN: No rash or lesion. MUSCULOSKELETAL: No joint pain or arthritis.   NEUROLOGIC: No tingling, numbness, weakness.  PSYCHIATRY: No anxiety or depression.   DRUG ALLERGIES:   Allergies  Allergen Reactions  . Hydrocodone Other (See Comments)    Pt states that this medication caused cirrhosis of the liver.    . Erythromycin Other (See Comments)    Reaction:  Fever   . Prednisone Other (See Comments)    Reaction:  Unknown   . Rosiglitazone Maleate Swelling  . Codeine Sulfate Rash  . Tetanus-Diphtheria Toxoids Td Rash and Other (See Comments)    Reaction:  Fever     VITALS:  Blood pressure 138/71, pulse 71, temperature 97.5 F (36.4 C), temperature source Tympanic, resp. rate 13, height 5' 3"  (1.6 m), weight 61.825 kg (136 lb 4.8 oz), SpO2 93 %.  PHYSICAL EXAMINATION:  VITAL SIGNS: Filed Vitals:   01/18/16 1243 01/18/16 1253  BP: 138/73 138/71  Pulse: 78 71  Temp:    Resp: 44 13   GENERAL:60 y.o.female currently in no acute distress.  HEAD: Normocephalic, atraumatic.  EYES: Pupils equal, round, reactive to light.  Extraocular muscles intact. No scleral icterus.  MOUTH: Moist mucosal membrane. Dentition intact. No abscess noted.  EAR, NOSE, THROAT: Clear without exudates. No external lesions.  NECK: Supple. No thyromegaly. No nodules. No JVD.  PULMONARY: Clear to ascultation, without wheeze rails or rhonci. No use of accessory muscles, Good respiratory effort. good air entry bilaterally CHEST: Nontender to palpation.  CARDIOVASCULAR: S1 and S2. Regular rate and rhythm. No murmurs, rubs, or gallops. No edema. Pedal pulses 2+ bilaterally.  GASTROINTESTINAL: Soft, nontender, nondistended. No masses. Positive bowel sounds. No hepatosplenomegaly.  MUSCULOSKELETAL: No swelling, clubbing, or edema. Range of motion full in all extremities.  NEUROLOGIC: Cranial nerves II through XII are intact. No gross focal neurological deficits. Sensation intact. Reflexes intact.  SKIN: No ulceration, lesions, rashes, or cyanosis. Skin warm and dry. Turgor intact.  PSYCHIATRIC: Mood, affect within normal limits. The patient is awake, alert and oriented x 3. Insight, judgment intact.      LABORATORY PANEL:   CBC  Recent Labs Lab 01/18/16 0402  WBC 4.2  HGB 9.1*  HCT 28.3*  PLT 125*   ------------------------------------------------------------------------------------------------------------------  Chemistries   Recent Labs Lab 01/17/16 0441 01/18/16 0402  NA 140 141  K 3.2* 3.9  CL 111 111  CO2 24 25  GLUCOSE 141* 92  BUN 10 8  CREATININE 0.55 0.61  CALCIUM 8.4* 8.4*  MG 1.6* 1.9  AST 23  --   ALT 21  --   ALKPHOS 76  --   BILITOT 0.6  --    ------------------------------------------------------------------------------------------------------------------  Cardiac Enzymes  Recent Labs Lab 01/15/16 0916  TROPONINI 0.05*   ------------------------------------------------------------------------------------------------------------------  RADIOLOGY:  Nm Gastric Emptying  01/16/2016  CLINICAL  DATA:  Nausea and vomiting for 4 days. EXAM: NUCLEAR MEDICINE GASTRIC EMPTYING SCAN TECHNIQUE: After oral ingestion of radiolabeled meal, sequential abdominal images were obtained for 4 hours. Percentage of activity emptying the stomach was calculated at 1 hour, 2 hour, 3 hour, and 4 hours. RADIOPHARMACEUTICALS:  2.02 mCi Tc-5mMDP labeled sulfur colloid orally COMPARISON:  None. FINDINGS: Expected location of the stomach in the left upper quadrant. Ingested meal empties the stomach gradually over the course of the study. 44% emptied at 1 hr ( normal >= 10%) 51% emptied at 2 hr ( normal >= 40%) 70% emptied at 3 hr ( normal >= 70%) 78% emptied at 4 hr ( normal >= 90%) IMPRESSION: Delayed gastric emptying study. Electronically Signed   By: JMarijo Conception M.D.   On: 01/16/2016 15:07    EKG:   Orders placed or performed during the hospital encounter of 01/15/16  . ED EKG  . ED EKG  . ED EKG  . ED EKG    ASSESSMENT AND PLAN:   Rita Banksonis a 60y.o. female presenting with Abdominal Pain and Nausea . Admitted 01/15/2016 : Day #: 3 days   1 diffuse colitis-DC Flagyl can continue Zosyn for now she input appreciated for sigmoidoscopy -2. diabetic gastroparesis- Reglan for endoscopy 3. diabetes mellitus- levemir dose decreased as patient only on a liquid diet. -Added sliding scale insulin 4. Paroxysmal [facility] with frequent [facility] Eli[facility] post[facility] 5 chronic abdominal pain-continue MS Contin and also morphine for breakthrough pain medication 6 nonalcoholic liver cirrhosis-lactulose is discontinued due to her diarrhea. Continue rifaximin. 7 GERD-continue Protonix twice a day   All the records are reviewed and case discussed with Care Management/Social Workerr. Management plans discussed with the patient, family and they are in agreement.  CODE STATUS: dnr TOTAL TIME TAKING CARE OF THIS PATIENT: 28 minutes.   POSSIBLE D/C IN  1DAYS, DEPENDING ON CLINICAL CONDITION.   Hower,  DKarenann CaiD on 01/18/2016 at 1:13 PM  Between 7am to 6pm - Pager - [phone]  After 6pm: House Pager: - [facility]  Office  [phone] CC: Primary care physician; HMaryland Pink MD

## 2016-01-18 NOTE — Anesthesia Preprocedure Evaluation (Signed)
Anesthesia Evaluation  Patient identified by MRN, date of birth, ID band Patient awake    Reviewed: Allergy & Precautions, H&P , NPO status , Patient's Chart, lab work & pertinent test results  History of Anesthesia Complications Negative for: history of anesthetic complications  Airway Mallampati: III  TM Distance: >3 FB Neck ROM: limited    Dental  (+) Poor Dentition, Missing, Edentulous Upper, Edentulous Lower   Pulmonary shortness of breath and with exertion, pneumonia, resolved,    Pulmonary exam normal breath sounds clear to auscultation       Cardiovascular Exercise Tolerance: Good hypertension, (-) angina+ CAD, + Past MI, +CHF and + DOE  Normal cardiovascular examII Rhythm:regular Rate:Normal     Neuro/Psych PSYCHIATRIC DISORDERS Anxiety Depression TIA Neuromuscular disease CVA, Residual Symptoms    GI/Hepatic Neg liver ROS, hiatal hernia, PUD, GERD  Controlled,  Endo/Other  diabetes, Type 1, Insulin DependentHypothyroidism Hyperthyroidism   Renal/GU negative Renal ROS  negative genitourinary   Musculoskeletal  (+) Arthritis ,   Abdominal   Peds  Hematology negative hematology ROS (+)   Anesthesia Other Findings Past Medical History:   Type 1 diabetes ([place])                                          Comment:on levemir   Hypothyroid                                                  Degenerative disk disease                                    Stomach ulcer                                                Diverticulitis                                               Syncope                                         01/2015       Anxiety                                                      GERD (gastroesophageal reflux disease)                       History of hiatal hernia                                     Cancer ([place])  Comment:HX OF CANCER OF UTERUS    TIA  (transient ischemic attack)                 02/2015       Hypertension                                                 Pancreatitis                                                 PAF (paroxysmal atrial fibrillation) ([place])      03/2015        Comment:a. new onset 03/2015 in setting of intractable               N/V; b. on Eliquis 5 mg bid; c. CHADSVASc 4               (DM, TIA x 2, female)   Intussusception intestine ([place])                 05/2015      Stroke ([place])                                                   Comment:with minimal left sided weakness   Allergy                                                      Cirrhosis of liver not due to alcohol ([place])     2016         Gastroparesis                                                Sick sinus syndrome [facility])                                   Past Surgical History:   HERNIA REPAIR                                                 ABDOMINAL HYSTERECTOMY                                        CHOLECYSTECTOMY                                               ESOPHAGOGASTRODUODENOSCOPY  N/A 04/04/2015      Comment:Procedure: ESOPHAGOGASTRODUODENOSCOPY (EGD);                Surgeon: Hulen Luster, MD;  Location: [facility]               ENDOSCOPY;  Service: Endoscopy;  Laterality:               N/A;   CARDIAC CATHETERIZATION                         N/A 01/12/2016       Comment:Procedure: Left Heart Cath and Coronary               Angiography;  Surgeon: Wellington Hampshire, MD;                Location: [facility];  Service:               Cardiovascular;  Laterality: N/A;  BMI    Body Mass Index   24.15 kg/m 2      Reproductive/Obstetrics negative OB ROS                             Anesthesia Physical Anesthesia Plan  ASA: IV  Anesthesia Plan: General   Post-op Pain Management:    Induction:   Airway Management Planned:   Additional Equipment:   Intra-op Plan:   Post-operative Plan:    Informed Consent: I have reviewed the patients History and Physical, chart, labs and discussed the procedure including the risks, benefits and alternatives for the proposed anesthesia with the patient or authorized representative who has indicated his/her understanding and acceptance.   Dental Advisory Given  Plan Discussed with: Anesthesiologist, CRNA and Surgeon  Anesthesia Plan Comments: (Patient has cardiac clearance for this procedure.   Patient informed that they are higher risk for complications from anesthesia during this procedure due to their medical history.  Patient voiced understanding. )        Anesthesia Quick Evaluation

## 2016-01-18 NOTE — Care Management (Signed)
[facility] was not able to make contact with patient at previous discharge to set up initial visit.  [name] with [facility] says that agency made many calls- left messages and calls not returned. CM spoke wth patient regarding not making herself available for home health visits on prior occasions and did again today.  Patient offers "I know."

## 2016-01-18 NOTE — Op Note (Signed)
[facility] Gastroenterology Patient Name: Nikkie Liming Procedure Date: 01/18/2016 11:39 AM MRN: [id number] Account #: [id number] Date of Birth: 1956/05/15 Admit Type: Inpatient Age: 60 Room: [facility] ENDO ROOM 4 Gender: Female Note Status: Finalized Procedure:            Upper GI endoscopy Indications:          Epigastric abdominal pain Providers:            Lucilla Lame, MD Referring MD:         Irven Easterly. Kary Kos, MD (Referring MD) Medicines:            Propofol per Anesthesia Complications:        No immediate complications. Procedure:            Pre-Anesthesia Assessment:                       - Prior to the procedure, a History and Physical was                        performed, and patient medications and allergies were                        reviewed. The patient's tolerance of previous                        anesthesia was also reviewed. The risks and benefits of                        the procedure and the sedation options and risks were                        discussed with the patient. All questions were                        answered, and informed consent was obtained. Prior                        Anticoagulants: The patient has taken no previous                        anticoagulant or antiplatelet agents. ASA Grade                        Assessment: II - A patient with mild systemic disease.                        After reviewing the risks and benefits, the patient was                        deemed in satisfactory condition to undergo the                        procedure.                       After obtaining informed consent, the endoscope was                        passed under direct vision. Throughout the procedure,  the patient's blood pressure, pulse, and oxygen                        saturations were monitored continuously. The                        Colonoscope was introduced through the mouth, and   advanced to the second part of duodenum. The upper GI                        endoscopy was accomplished without difficulty. The                        patient tolerated the procedure well. Findings:      A prior Nissen fundoplication was found in the distal esophagus.      Localized mild inflammation characterized by erythema was found in the       gastric antrum. Biopsies were taken with a cold forceps for histology.      A suture was found at the gastroesophageal junction. Removal was       accomplished with a jumbo forceps.      The examined duodenum was normal. Impression:           - A Nissen fundoplication was found.                       - Gastritis. Biopsied.                       - A suture was found in the stomach. Removal was                        successful.                       - Normal examined duodenum. Recommendation:       - Await pathology results. Procedure Code(s):    --- Professional ---                       [phone], Esophagogastroduodenoscopy, flexible, transoral;                        with removal of foreign body(s)                       43239, Esophagogastroduodenoscopy, flexible, transoral;                        with biopsy, single or multiple Diagnosis Code(s):    --- Professional ---                       R10.13, Epigastric pain                       Z98.890, Other specified postprocedural states                       K29.70, Gastritis, unspecified, without bleeding                       T18.2XXA, Foreign body in stomach, initial encounter CPT copyright 2016 American Medical Association. All rights reserved. The codes documented in this report  are preliminary and upon coder review may  be revised to meet current compliance requirements. Lucilla Lame, MD 01/18/2016 11:58:52 AM This report has been signed electronically. Number of Addenda: 0 Note Initiated On: 01/18/2016 11:39 AM      [facility]

## 2016-01-18 NOTE — Anesthesia Postprocedure Evaluation (Signed)
Anesthesia Post Note  Patient: [name]  Procedure(s) Performed: Procedure(s) (LRB): ESOPHAGOGASTRODUODENOSCOPY (EGD) WITH PROPOFOL (N/A) FLEXIBLE SIGMOIDOSCOPY (N/A)  Patient location during evaluation: Endoscopy Anesthesia Type: General Level of consciousness: awake and alert Pain management: pain level controlled Vital Signs Assessment: post-procedure vital signs reviewed and stable Respiratory status: spontaneous breathing, nonlabored ventilation, respiratory function stable and patient connected to nasal cannula oxygen Cardiovascular status: blood pressure returned to baseline and stable Postop Assessment: no signs of nausea or vomiting Anesthetic complications: no    Last Vitals:  Filed Vitals:   01/18/16 1243 01/18/16 1253  BP: 138/73 138/71  Pulse: 78 71  Temp:    Resp: 14 13    Last Pain:  Filed Vitals:   01/18/16 1300  PainSc: Asleep                 Precious Haws Piscitello

## 2016-01-19 ENCOUNTER — Ambulatory Visit: Payer: Commercial Managed Care - HMO

## 2016-01-19 LAB — GLUCOSE, CAPILLARY
GLUCOSE-CAPILLARY: 155 mg/dL — AB (ref 65–99)
GLUCOSE-CAPILLARY: 156 mg/dL — AB (ref 65–99)
GLUCOSE-CAPILLARY: 183 mg/dL — AB (ref 65–99)
Glucose-Capillary: 151 mg/dL — ABNORMAL HIGH (ref 65–99)

## 2016-01-19 LAB — SURGICAL PATHOLOGY

## 2016-01-19 NOTE — Care Management (Addendum)
Spoke with patient again about home health visits.  Patient says if her husband does not recognize the phone number, he will not answer the phone and there is no answering machine or voicemail box.  Informed patient that home visit will be made on 5/12.  Patient is in full agreement.  Notified [facility].  [facility] says that if no one in the home answers the phone- agency will make a drive by visit.  Patient informed and in total agreement

## 2016-01-19 NOTE — Care Management Important Message (Signed)
Important Message  Patient Details  Name: [name] MRN: [id number] Date of Birth: 18-Feb-1956   Medicare Important Message Given:  Yes    [name] 01/19/2016, 10:51 AM

## 2016-01-19 NOTE — Discharge Summary (Signed)
[place] [facility] [place] NAME: [name]    MR#:  [id number]  DATE OF BIRTH:  07/28/1956  DATE OF ADMISSION:  01/15/2016 ADMITTING PHYSICIAN: Gladstone Lighter, MD  DATE OF DISCHARGE: 01/19/2016  PRIMARY CARE PHYSICIAN: Maryland Pink, MD    ADMISSION DIAGNOSIS:   Generalized abdominal pain [R10.84] Nausea & vomiting [R11.2]   DISCHARGE DIAGNOSIS:  Active Problems:   Gastritis   Foreign body in stomach - retained suture   Diabetic gastroparesis   PAF (paroxysmal atrial fibrillation) (HCC)   Congestive dilated cardiomyopathy ([place])   SECONDARY DIAGNOSIS:   Past Medical History  Diagnosis Date  . Type 1 diabetes ([place])     on levemir  . Hypothyroid   . Degenerative disk disease   . Stomach ulcer   . Diverticulitis   . Syncope 01/2015  . Anxiety   . GERD (gastroesophageal reflux disease)   . History of hiatal hernia   . Cancer (HCC)     HX OF CANCER OF UTERUS   . TIA (transient ischemic attack) 02/2015  . Hypertension   . Pancreatitis   . PAF (paroxysmal atrial fibrillation) ([place]) 03/2015    a. new onset 03/2015 in setting of intractable N/V; b. on Eliquis 5 mg bid; c. CHADSVASc 4 (DM, TIA x 2, female)  . Intussusception intestine ([place]) 05/2015  . Stroke [facility])     with minimal left sided weakness  . Allergy   . Cirrhosis of liver not due to alcohol ([place]) 2016  . Gastroparesis   . Sick sinus syndrome [facility])     HOSPITAL COURSE:  [name]  is a 60 y.o. female admitted 01/15/2016 with chief complaint Abdominal Pain and Nausea . Please see H&P performed by Gladstone Lighter, MD for further information. Patient was admitted to the hospital with the above symptoms. She was evaluated by gastroenterology who aided in her treatment and diagnosis. During her course in the hospital she had a gastric emptying study which revealed delayed emptying, this was followed by EGD revealing gastritis and retained suture which was removed,  flexible sigmoidoscopy also performed with normal findings however biopsies were taken. Unfortunately her symptoms have improved prior to discharge.  DISCHARGE CONDITIONS:   Stable/improved  CONSULTS OBTAINED:  Treatment Team:  Lucilla Lame, MD Minna Merritts, MD  DRUG ALLERGIES:   Allergies  Allergen Reactions  . Hydrocodone Other (See Comments)    Pt states that this medication caused cirrhosis of the liver.    . Erythromycin Other (See Comments)    Reaction:  Fever   . Prednisone Other (See Comments)    Reaction:  Unknown   . Rosiglitazone Maleate Swelling  . Codeine Sulfate Rash  . Tetanus-Diphtheria Toxoids Td Rash and Other (See Comments)    Reaction:  Fever     DISCHARGE MEDICATIONS:   Current Discharge Medication List    CONTINUE these medications which have NOT CHANGED   Details  albuterol (PROVENTIL HFA;VENTOLIN HFA) 108 (90 Base) MCG/ACT inhaler Inhale 2 puffs into the lungs every 6 (six) hours as needed for wheezing or shortness of breath. Qty: 1 Inhaler, Refills: 2    amLODipine (NORVASC) 5 MG tablet Take 5 mg by mouth daily.    apixaban (ELIQUIS) 5 MG TABS tablet Take 1 tablet (5 mg total) by mouth 2 (two) times daily. Qty: 60 tablet, Refills: 0    atorvastatin (LIPITOR) 40 MG tablet Take 40 mg by mouth at bedtime.    benzonatate (TESSALON)  200 MG capsule Take 1 capsule (200 mg total) by mouth 3 (three) times daily as needed for cough. Qty: 20 capsule, Refills: 0    budesonide-formoterol (SYMBICORT) 160-4.5 MCG/ACT inhaler Inhale 2 puffs into the lungs 2 (two) times daily. Qty: 1 Inhaler, Refills: 12    citalopram (CELEXA) 20 MG tablet Take 20 mg by mouth daily.    dicyclomine (BENTYL) 10 MG capsule Take 10 mg by mouth 4 (four) times daily -  before meals and at bedtime.     insulin aspart (NOVOLOG) 100 UNIT/ML injection Inject into the skin 3 (three) times daily with meals as needed for high blood sugar. Pt uses as needed per sliding scale.      insulin detemir (LEVEMIR) 100 UNIT/ML injection Inject 17 Units into the skin at bedtime.    levothyroxine (SYNTHROID, LEVOTHROID) 75 MCG tablet Take 1 tablet (75 mcg total) by mouth daily before breakfast. Qty: 30 tablet, Refills: 1    magnesium oxide (MAG-OX) 400 (241.3 Mg) MG tablet Take 1 tablet (400 mg total) by mouth 2 (two) times daily. Qty: 10 tablet, Refills: 0    metoCLOPramide (REGLAN) 5 MG tablet Take 1 tablet (5 mg total) by mouth 4 (four) times daily -  before meals and at bedtime. Qty: 90 tablet, Refills: 0    metoprolol tartrate (LOPRESSOR) 25 MG tablet Take 25 mg by mouth 2 (two) times daily.     morphine (MS CONTIN) 15 MG 12 hr tablet Take 1 tablet (15 mg total) by mouth every 8 (eight) hours. Qty: 75 tablet, Refills: 0    oxyCODONE (OXY IR/ROXICODONE) 5 MG immediate release tablet Take 5-10 mg by mouth every 6 (six) hours as needed for severe pain.    pantoprazole (PROTONIX) 40 MG tablet Take 40 mg by mouth 2 (two) times daily before a meal.    rifaximin (XIFAXAN) 550 MG TABS tablet Take 1 tablet (550 mg total) by mouth 2 (two) times daily. Qty: 60 tablet, Refills: 6    tiZANidine (ZANAFLEX) 4 MG tablet Take 4 mg by mouth 3 (three) times daily as needed for muscle spasms.          DISCHARGE INSTRUCTIONS:    DIET:  Diabetic diet  DISCHARGE CONDITION:  Stable  ACTIVITY:  Activity as tolerated  OXYGEN:  Home Oxygen: No.   Oxygen Delivery: room air  DISCHARGE LOCATION:  home   If you experience worsening of your admission symptoms, develop shortness of breath, life threatening emergency, suicidal or homicidal thoughts you must seek medical attention immediately by calling 911 or calling your MD immediately  if symptoms less severe.  You Must read complete instructions/literature along with all the possible adverse reactions/side effects for all the Medicines you take and that have been prescribed to you. Take any new Medicines after you have  completely understood and accpet all the possible adverse reactions/side effects.   Please note  You were cared for by a hospitalist during your hospital stay. If you have any questions about your discharge medications or the care you received while you were in the hospital after you are discharged, you can call the unit and asked to speak with the hospitalist on call if the hospitalist that took care of you is not available. Once you are discharged, your primary care physician will handle any further medical issues. Please note that NO REFILLS for any discharge medications will be authorized once you are discharged, as it is imperative that you return to your primary care physician (  or establish a relationship with a primary care physician if you do not have one) for your aftercare needs so that they can reassess your need for medications and monitor your lab values.    On the day of Discharge:   VITAL SIGNS:  Blood pressure 154/82, pulse 77, temperature 98.6 F (37 C), temperature source Oral, resp. rate 18, height 5' 3"  (1.6 m), weight 61.825 kg (136 lb 4.8 oz), SpO2 100 %.  I/O:   Intake/Output Summary (Last 24 hours) at 01/19/16 0849 Last data filed at 01/19/16 0838  Gross per 24 hour  Intake 2822.5 ml  Output    850 ml  Net 1972.5 ml    PHYSICAL EXAMINATION:  GENERAL:  60 y.o.-year-old patient lying in the bed with no acute distress.  EYES: Pupils equal, round, reactive to light and accommodation. No scleral icterus. Extraocular muscles intact.  HEENT: Head atraumatic, normocephalic. Oropharynx and nasopharynx clear.  NECK:  Supple, no jugular venous distention. No thyroid enlargement, no tenderness.  LUNGS: Normal breath sounds bilaterally, no wheezing, rales,rhonchi or crepitation. No use of accessory muscles of respiration.  CARDIOVASCULAR: S1, S2 normal. No murmurs, rubs, or gallops.  ABDOMEN: Soft, non-tender, non-distended. Bowel sounds present. No organomegaly or mass.    EXTREMITIES: No pedal edema, cyanosis, or clubbing.  NEUROLOGIC: Cranial nerves II through XII are intact. Muscle strength 5/5 in all extremities. Sensation intact. Gait not checked.  PSYCHIATRIC: The patient is alert and oriented x 3.  SKIN: No obvious rash, lesion, or ulcer.   DATA REVIEW:   CBC  Recent Labs Lab 01/18/16 0402  WBC 4.2  HGB 9.1*  HCT 28.3*  PLT 125*    Chemistries   Recent Labs Lab 01/17/16 0441 01/18/16 0402  NA 140 141  K 3.2* 3.9  CL 111 111  CO2 24 25  GLUCOSE 141* 92  BUN 10 8  CREATININE 0.55 0.61  CALCIUM 8.4* 8.4*  MG 1.6* 1.9  AST 23  --   ALT 21  --   ALKPHOS 76  --   BILITOT 0.6  --     Cardiac Enzymes  Recent Labs Lab 01/15/16 0916  TROPONINI 0.05*    Microbiology Results  Results for orders placed or performed during the hospital encounter of 01/15/16  Blood culture (routine x 2)     Status: None (Preliminary result)   Collection Time: 01/15/16 11:06 AM  Result Value Ref Range Status   Specimen Description BLOOD RIGHT THUMB  Final   Special Requests BOTTLES DRAWN AEROBIC AND ANAEROBIC  1CC  Final   Culture NO GROWTH 3 DAYS  Final   Report Status PENDING  Incomplete  Blood culture (routine x 2)     Status: None (Preliminary result)   Collection Time: 01/15/16 11:06 AM  Result Value Ref Range Status   Specimen Description BLOOD LEFT ARM  Final   Special Requests BOTTLES DRAWN AEROBIC AND ANAEROBIC  1CC  Final   Culture NO GROWTH 3 DAYS  Final   Report Status PENDING  Incomplete  Gastrointestinal Panel by PCR , Stool     Status: None   Collection Time: 01/16/16  7:37 AM  Result Value Ref Range Status   Campylobacter species NOT DETECTED NOT DETECTED Final   Plesimonas shigelloides NOT DETECTED NOT DETECTED Final   Salmonella species NOT DETECTED NOT DETECTED Final   Yersinia enterocolitica NOT DETECTED NOT DETECTED Final   Vibrio species NOT DETECTED NOT DETECTED Final   Vibrio cholerae NOT DETECTED NOT DETECTED  Final   Enteroaggregative E coli (EAEC) NOT DETECTED NOT DETECTED Final   Enteropathogenic E coli (EPEC) NOT DETECTED NOT DETECTED Final   Enterotoxigenic E coli (ETEC) NOT DETECTED NOT DETECTED Final   Shiga like toxin producing E coli (STEC) NOT DETECTED NOT DETECTED Final   E. coli O157 NOT DETECTED NOT DETECTED Final   Shigella/Enteroinvasive E coli (EIEC) NOT DETECTED NOT DETECTED Final   Cryptosporidium NOT DETECTED NOT DETECTED Final   Cyclospora cayetanensis NOT DETECTED NOT DETECTED Final   Entamoeba histolytica NOT DETECTED NOT DETECTED Final   Giardia lamblia NOT DETECTED NOT DETECTED Final   Adenovirus F40/41 NOT DETECTED NOT DETECTED Final   Astrovirus NOT DETECTED NOT DETECTED Final   Norovirus GI/GII NOT DETECTED NOT DETECTED Final   Rotavirus A NOT DETECTED NOT DETECTED Final   Sapovirus (I, II, IV, and V) NOT DETECTED NOT DETECTED Final  C difficile quick scan w PCR reflex     Status: None   Collection Time: 01/16/16  7:37 AM  Result Value Ref Range Status   C Diff antigen NEGATIVE NEGATIVE Final   C Diff toxin NEGATIVE NEGATIVE Final   C Diff interpretation Negative for C. difficile  Final    RADIOLOGY:  No results found.   Management plans discussed with the patient, family and they are in agreement.  CODE STATUS:     Code Status Orders        Start     Ordered   01/15/16 1340  Do not attempt resuscitation (DNR)   Continuous    Question Answer Comment  In the event of cardiac or respiratory ARREST Do not call a "code blue"   In the event of cardiac or respiratory ARREST Do not perform Intubation, CPR, defibrillation or ACLS   In the event of cardiac or respiratory ARREST Use medication by any route, position, wound care, and other measures to relive pain and suffering. May use oxygen, suction and manual treatment of airway obstruction as needed for comfort.      01/15/16 1339    Code Status History    Date Active Date Inactive Code Status Order ID  Comments User Context   01/10/2016 10:00 PM 01/13/2016  5:24 PM DNR [id number]  Harrie Foreman, MD Inpatient   12/27/2015 11:02 PM 01/01/2016  6:17 PM DNR [id number]  Max Sane, MD Inpatient   12/16/2015  4:54 PM 12/19/2015  4:52 PM DNR [id number]  Loletha Grayer, MD ED   11/11/2015 11:36 AM 11/16/2015  2:05 PM Full Code [id number]  Samella Parr, NP Inpatient   10/15/2015  1:23 AM 10/16/2015  6:17 PM Full Code [id number]  Lance Coon, MD Inpatient   10/04/2015  8:19 PM 10/08/2015  6:34 PM Full Code [id number]  Bethena Roys, MD Inpatient   08/15/2015  1:05 AM 08/16/2015  8:04 PM Full Code [id number]  Harrie Foreman, MD Inpatient   08/01/2015  1:25 AM 08/06/2015  5:41 PM Full Code [id number]  Harrie Foreman, MD Inpatient   07/04/2015  2:53 AM 07/11/2015  6:33 PM Full Code [id number]  Lytle Butte, MD ED   05/27/2015 10:48 PM 05/31/2015  2:37 PM Full Code [id number]  Rise Patience, MD Inpatient   05/19/2015  1:14 AM 05/22/2015  4:28 PM Full Code [id number]  Rise Patience, MD Inpatient   04/05/2015  9:16 AM 04/07/2015  5:23 PM Full Code [id number]  Bettey Costa, MD Inpatient   04/03/2015 12:31 PM 04/05/2015  9:16 AM Full Code  [id number]  Bettey Costa, MD Inpatient   03/11/2015  4:49 PM 03/14/2015  3:50 PM Full Code [id number]  Dustin Flock, MD Inpatient   02/15/2015  4:51 PM 02/17/2015  2:33 PM DNR [id number]  Dustin Flock, MD Inpatient   02/15/2015  4:35 PM 02/15/2015  4:51 PM Full Code [id number]  Dustin Flock, MD Inpatient   01/11/2015  2:40 AM 01/12/2015  6:11 PM Full Code [id number]  Etta Quill, DO ED    Advance Directive Documentation        Most Recent Value   Type of Advance Directive  Out of facility DNR (pink MOST or yellow form)   Pre-existing out of facility DNR order (yellow form or pink MOST form)     "MOST" Form in Place?        TOTAL TIME TAKING CARE OF THIS PATIENT: 28 minutes.    Matsue Strom,  Karenann Cai.D on 01/19/2016 at 8:49 AM  Between 7am to 6pm - Pager - [phone]  After 6pm go to  [email or web address] - Proofreader  [facility]  Office  [phone]  CC: Primary care physician; Maryland Pink, MD

## 2016-01-19 NOTE — Progress Notes (Signed)
Pt and husband given d/c instructions r/t activity, diet, followup care and medications, voiced understanding, pt d/c home via wheelchair escorted by auxillary and family

## 2016-01-19 NOTE — Progress Notes (Signed)
Pharmacy Antibiotic Note  [name] is a 60 y.o. female admitted on 01/15/2016 with Intra-Abdominal Infection.  Pharmacy has been consulted for Zosyn dosing.  Plan: Will continue Zosyn 3.375 IV EI every 8 hours.   Height: 5' 3"  (160 cm) Weight: 136 lb 4.8 oz (61.825 kg) IBW/kg (Calculated) : 52.4  Temp (24hrs), Avg:98.2 F (36.8 C), Min:97.5 F (36.4 C), Max:98.6 F (37 C)   Recent Labs Lab 01/13/16 0416 01/15/16 0916 01/15/16 1106 01/17/16 0441 01/18/16 0402  WBC 8.5 6.1  --  3.9 4.2  CREATININE  --  0.63  --  0.55 0.61  LATICACIDVEN  --   --  1.8  --   --     Estimated Creatinine Clearance: 60.3 mL/min (by C-G formula based on Cr of 0.61).    Allergies  Allergen Reactions  . Hydrocodone Other (See Comments)    Pt states that this medication caused cirrhosis of the liver.    . Erythromycin Other (See Comments)    Reaction:  Fever   . Prednisone Other (See Comments)    Reaction:  Unknown   . Rosiglitazone Maleate Swelling  . Codeine Sulfate Rash  . Tetanus-Diphtheria Toxoids Td Rash and Other (See Comments)    Reaction:  Fever     Antimicrobials this admission: 01/15/16 Vancomycin >> 01/15/16 01/15/16 Zosyn >>  Flagyl 5/7-5/10  Microbiology results: 5/7 BCx: NGTD Stool cx neg Cdiff neg  Thank you for allowing pharmacy to be a part of this patient's care.  Rayna Sexton, PharmD, BCPS Clinical Pharmacist 01/19/2016 8:22 AM

## 2016-01-20 ENCOUNTER — Other Ambulatory Visit: Payer: Self-pay | Admitting: *Deleted

## 2016-01-20 DIAGNOSIS — K529 Noninfective gastroenteritis and colitis, unspecified: Secondary | ICD-10-CM | POA: Diagnosis not present

## 2016-01-20 DIAGNOSIS — E1043 Type 1 diabetes mellitus with diabetic autonomic (poly)neuropathy: Secondary | ICD-10-CM | POA: Diagnosis not present

## 2016-01-20 DIAGNOSIS — K7460 Unspecified cirrhosis of liver: Secondary | ICD-10-CM | POA: Diagnosis not present

## 2016-01-20 DIAGNOSIS — I10 Essential (primary) hypertension: Secondary | ICD-10-CM | POA: Diagnosis not present

## 2016-01-20 DIAGNOSIS — K3184 Gastroparesis: Secondary | ICD-10-CM | POA: Diagnosis not present

## 2016-01-20 DIAGNOSIS — K2970 Gastritis, unspecified, without bleeding: Secondary | ICD-10-CM | POA: Diagnosis not present

## 2016-01-20 DIAGNOSIS — K5790 Diverticulosis of intestine, part unspecified, without perforation or abscess without bleeding: Secondary | ICD-10-CM | POA: Diagnosis not present

## 2016-01-20 DIAGNOSIS — I495 Sick sinus syndrome: Secondary | ICD-10-CM | POA: Diagnosis not present

## 2016-01-20 DIAGNOSIS — I480 Paroxysmal atrial fibrillation: Secondary | ICD-10-CM | POA: Diagnosis not present

## 2016-01-20 LAB — CULTURE, BLOOD (ROUTINE X 2)
CULTURE: NO GROWTH
Culture: NO GROWTH

## 2016-01-20 NOTE — Patient Outreach (Signed)
Second attempt made to contact pt for transition of care (discharged 5/11) as this RN CM is covering for coworker Richarda Osmond RN CM.   Unable to leave a voice message as message on pt's phone- customer not available at this time, try again later.      Plan  To relay to [facility] CM unable to contact pt for transition of care.     Zara Chess.   [place] [facility]  [phone]

## 2016-01-20 NOTE — Patient Outreach (Signed)
Attempt made to contact pt for transition of care (discharged 5/11) as this RN CM covering for coworker Richarda Osmond RN CM.  Unable to leave a voice message as message on phone- person not available at this time, try later.   Will try again.    Zara Chess.   [place] [facility]  [phone]

## 2016-01-21 ENCOUNTER — Emergency Department: Payer: Commercial Managed Care - HMO

## 2016-01-21 ENCOUNTER — Inpatient Hospital Stay
Admission: EM | Admit: 2016-01-21 | Discharge: 2016-01-25 | DRG: 074 | Disposition: A | Discharge status: Home Health | Payer: Commercial Managed Care - HMO | Attending: Internal Medicine | Admitting: Internal Medicine

## 2016-01-21 ENCOUNTER — Encounter: Payer: Self-pay | Admitting: Gastroenterology

## 2016-01-21 DIAGNOSIS — Z7951 Long term (current) use of inhaled steroids: Secondary | ICD-10-CM | POA: Diagnosis not present

## 2016-01-21 DIAGNOSIS — Y92230 Patient room in hospital as the place of occurrence of the external cause: Secondary | ICD-10-CM | POA: Diagnosis present

## 2016-01-21 DIAGNOSIS — F341 Dysthymic disorder: Secondary | ICD-10-CM | POA: Diagnosis present

## 2016-01-21 DIAGNOSIS — K7460 Unspecified cirrhosis of liver: Secondary | ICD-10-CM | POA: Diagnosis not present

## 2016-01-21 DIAGNOSIS — Z888 Allergy status to other drugs, medicaments and biological substances status: Secondary | ICD-10-CM | POA: Diagnosis not present

## 2016-01-21 DIAGNOSIS — I4581 Long QT syndrome: Secondary | ICD-10-CM | POA: Diagnosis present

## 2016-01-21 DIAGNOSIS — Z8673 Personal history of transient ischemic attack (TIA), and cerebral infarction without residual deficits: Secondary | ICD-10-CM | POA: Diagnosis not present

## 2016-01-21 DIAGNOSIS — D649 Anemia, unspecified: Secondary | ICD-10-CM | POA: Diagnosis present

## 2016-01-21 DIAGNOSIS — I808 Phlebitis and thrombophlebitis of other sites: Secondary | ICD-10-CM | POA: Diagnosis not present

## 2016-01-21 DIAGNOSIS — E1165 Type 2 diabetes mellitus with hyperglycemia: Secondary | ICD-10-CM | POA: Diagnosis present

## 2016-01-21 DIAGNOSIS — K3184 Gastroparesis: Secondary | ICD-10-CM | POA: Diagnosis present

## 2016-01-21 DIAGNOSIS — Y848 Other medical procedures as the cause of abnormal reaction of the patient, or of later complication, without mention of misadventure at the time of the procedure: Secondary | ICD-10-CM | POA: Diagnosis not present

## 2016-01-21 DIAGNOSIS — E039 Hypothyroidism, unspecified: Secondary | ICD-10-CM | POA: Diagnosis present

## 2016-01-21 DIAGNOSIS — Z79891 Long term (current) use of opiate analgesic: Secondary | ICD-10-CM | POA: Diagnosis not present

## 2016-01-21 DIAGNOSIS — K219 Gastro-esophageal reflux disease without esophagitis: Secondary | ICD-10-CM | POA: Diagnosis present

## 2016-01-21 DIAGNOSIS — G8929 Other chronic pain: Secondary | ICD-10-CM | POA: Diagnosis present

## 2016-01-21 DIAGNOSIS — Z794 Long term (current) use of insulin: Secondary | ICD-10-CM

## 2016-01-21 DIAGNOSIS — Z4682 Encounter for fitting and adjustment of non-vascular catheter: Secondary | ICD-10-CM | POA: Diagnosis not present

## 2016-01-21 DIAGNOSIS — E1065 Type 1 diabetes mellitus with hyperglycemia: Secondary | ICD-10-CM | POA: Diagnosis present

## 2016-01-21 DIAGNOSIS — R140 Abdominal distension (gaseous): Secondary | ICD-10-CM | POA: Diagnosis not present

## 2016-01-21 DIAGNOSIS — Z885 Allergy status to narcotic agent status: Secondary | ICD-10-CM | POA: Diagnosis not present

## 2016-01-21 DIAGNOSIS — Z4659 Encounter for fitting and adjustment of other gastrointestinal appliance and device: Secondary | ICD-10-CM

## 2016-01-21 DIAGNOSIS — E1043 Type 1 diabetes mellitus with diabetic autonomic (poly)neuropathy: Principal | ICD-10-CM | POA: Diagnosis present

## 2016-01-21 DIAGNOSIS — R1013 Epigastric pain: Secondary | ICD-10-CM

## 2016-01-21 DIAGNOSIS — R197 Diarrhea, unspecified: Secondary | ICD-10-CM | POA: Diagnosis present

## 2016-01-21 DIAGNOSIS — Z8542 Personal history of malignant neoplasm of other parts of uterus: Secondary | ICD-10-CM | POA: Diagnosis not present

## 2016-01-21 DIAGNOSIS — R9431 Abnormal electrocardiogram [ECG] [EKG]: Secondary | ICD-10-CM | POA: Diagnosis not present

## 2016-01-21 DIAGNOSIS — Z8711 Personal history of peptic ulcer disease: Secondary | ICD-10-CM

## 2016-01-21 DIAGNOSIS — R946 Abnormal results of thyroid function studies: Secondary | ICD-10-CM | POA: Diagnosis present

## 2016-01-21 DIAGNOSIS — R627 Adult failure to thrive: Secondary | ICD-10-CM | POA: Diagnosis present

## 2016-01-21 DIAGNOSIS — E876 Hypokalemia: Secondary | ICD-10-CM | POA: Diagnosis not present

## 2016-01-21 DIAGNOSIS — R188 Other ascites: Secondary | ICD-10-CM | POA: Diagnosis present

## 2016-01-21 DIAGNOSIS — I4891 Unspecified atrial fibrillation: Secondary | ICD-10-CM | POA: Diagnosis present

## 2016-01-21 DIAGNOSIS — Z887 Allergy status to serum and vaccine status: Secondary | ICD-10-CM | POA: Diagnosis not present

## 2016-01-21 DIAGNOSIS — K567 Ileus, unspecified: Secondary | ICD-10-CM | POA: Diagnosis present

## 2016-01-21 DIAGNOSIS — Z7901 Long term (current) use of anticoagulants: Secondary | ICD-10-CM | POA: Diagnosis not present

## 2016-01-21 DIAGNOSIS — T801XXA Vascular complications following infusion, transfusion and therapeutic injection, initial encounter: Secondary | ICD-10-CM | POA: Diagnosis not present

## 2016-01-21 DIAGNOSIS — I10 Essential (primary) hypertension: Secondary | ICD-10-CM | POA: Diagnosis present

## 2016-01-21 DIAGNOSIS — D6959 Other secondary thrombocytopenia: Secondary | ICD-10-CM | POA: Diagnosis not present

## 2016-01-21 DIAGNOSIS — Z8249 Family history of ischemic heart disease and other diseases of the circulatory system: Secondary | ICD-10-CM | POA: Diagnosis not present

## 2016-01-21 DIAGNOSIS — I809 Phlebitis and thrombophlebitis of unspecified site: Secondary | ICD-10-CM

## 2016-01-21 DIAGNOSIS — I480 Paroxysmal atrial fibrillation: Secondary | ICD-10-CM | POA: Diagnosis present

## 2016-01-21 DIAGNOSIS — K56609 Unspecified intestinal obstruction, unspecified as to partial versus complete obstruction: Secondary | ICD-10-CM

## 2016-01-21 LAB — COMPREHENSIVE METABOLIC PANEL
ALT: 19 U/L (ref 14–54)
AST: 24 U/L (ref 15–41)
Albumin: 3.9 g/dL (ref 3.5–5.0)
Alkaline Phosphatase: 84 U/L (ref 38–126)
Anion gap: 11 (ref 5–15)
BUN: 13 mg/dL (ref 6–20)
CHLORIDE: 102 mmol/L (ref 101–111)
CO2: 23 mmol/L (ref 22–32)
CREATININE: 0.7 mg/dL (ref 0.44–1.00)
Calcium: 9.2 mg/dL (ref 8.9–10.3)
GFR calc Af Amer: >60 mL/min (ref >60)
GFR calc non Af Amer: >60 mL/min (ref >60)
Glucose, Bld: 225 mg/dL — ABNORMAL HIGH (ref 65–99)
POTASSIUM: 3 mmol/L — AB (ref 3.5–5.1)
SODIUM: 136 mmol/L (ref 135–145)
Total Bilirubin: 1.3 mg/dL — ABNORMAL HIGH (ref 0.3–1.2)
Total Protein: 7.2 g/dL (ref 6.5–8.1)

## 2016-01-21 LAB — CBC
HEMATOCRIT: 36.8 % (ref 35.0–47.0)
Hemoglobin: 11.9 g/dL — ABNORMAL LOW (ref 12.0–16.0)
MCH: 27.5 pg (ref 26.0–34.0)
MCHC: 32.3 g/dL (ref 32.0–36.0)
MCV: 85.3 fL (ref 80.0–100.0)
Platelets: 150 10*3/uL (ref 150–440)
RBC: 4.31 MIL/uL (ref 3.80–5.20)
RDW: 16.7 % — AB (ref 11.5–14.5)
WBC: 7.6 10*3/uL (ref 3.6–11.0)

## 2016-01-21 LAB — URINALYSIS COMPLETE WITH MICROSCOPIC (ARMC ONLY)
Bilirubin Urine: NEGATIVE
Glucose, UA: 150 mg/dL — AB
HGB URINE DIPSTICK: NEGATIVE
Leukocytes, UA: NEGATIVE
Nitrite: NEGATIVE
PH: 5 (ref 5.0–8.0)
PROTEIN: 100 mg/dL — AB
Specific Gravity, Urine: 1.026 (ref 1.005–1.030)

## 2016-01-21 LAB — LIPASE, BLOOD: LIPASE: 25 U/L (ref 11–51)

## 2016-01-21 LAB — GLUCOSE, CAPILLARY: Glucose-Capillary: 194 mg/dL — ABNORMAL HIGH (ref 65–99)

## 2016-01-21 LAB — MAGNESIUM: MAGNESIUM: 1.4 mg/dL — AB (ref 1.7–2.4)

## 2016-01-21 MED ORDER — PROCHLORPERAZINE EDISYLATE 5 MG/ML IJ SOLN
10.0000 mg | Freq: Once | INTRAMUSCULAR | Status: AC
  Administered 2016-01-21: 10 mg via INTRAVENOUS
  Filled 2016-01-21: qty 2

## 2016-01-21 MED ORDER — LEVOTHYROXINE SODIUM 75 MCG PO TABS
75.0000 ug | ORAL_TABLET | Freq: Every day | ORAL | Status: DC
  Administered 2016-01-22 – 2016-01-25 (×4): 75 ug via ORAL
  Filled 2016-01-21 (×4): qty 1

## 2016-01-21 MED ORDER — POTASSIUM CHLORIDE CRYS ER 20 MEQ PO TBCR
20.0000 meq | EXTENDED_RELEASE_TABLET | Freq: Once | ORAL | Status: AC
  Administered 2016-01-21: 20 meq via ORAL
  Filled 2016-01-21: qty 1

## 2016-01-21 MED ORDER — ACETAMINOPHEN 650 MG RE SUPP: 650.0000 mg | Freq: Four times a day (QID) | RECTAL | Status: DC | PRN

## 2016-01-21 MED ORDER — ACETAMINOPHEN 325 MG PO TABS: 650.0000 mg | ORAL_TABLET | Freq: Four times a day (QID) | ORAL | Status: DC | PRN

## 2016-01-21 MED ORDER — HYDROMORPHONE HCL 1 MG/ML IJ SOLN
1.0000 mg | Freq: Once | INTRAMUSCULAR | Status: AC
  Administered 2016-01-21: 1 mg via INTRAVENOUS
  Filled 2016-01-21: qty 1

## 2016-01-21 MED ORDER — INSULIN ASPART 100 UNIT/ML ~~LOC~~ SOLN: 0.0000 [IU] | Freq: Three times a day (TID) | SUBCUTANEOUS | Status: DC

## 2016-01-21 MED ORDER — INSULIN ASPART 100 UNIT/ML ~~LOC~~ SOLN
0.0000 [IU] | Freq: Four times a day (QID) | SUBCUTANEOUS | Status: DC
  Administered 2016-01-22 – 2016-01-23 (×4): 1 [IU] via SUBCUTANEOUS
  Administered 2016-01-23 – 2016-01-24 (×2): 2 [IU] via SUBCUTANEOUS
  Administered 2016-01-24: 3 [IU] via SUBCUTANEOUS
  Administered 2016-01-24 – 2016-01-25 (×3): 2 [IU] via SUBCUTANEOUS
  Filled 2016-01-21: qty 3
  Filled 2016-01-21 (×2): qty 1
  Filled 2016-01-21 (×3): qty 2
  Filled 2016-01-21: qty 3
  Filled 2016-01-21 (×2): qty 1
  Filled 2016-01-21 (×3): qty 2

## 2016-01-21 MED ORDER — SODIUM CHLORIDE 0.9 % IV BOLUS (SEPSIS)
500.0000 mL | Freq: Once | INTRAVENOUS | Status: AC
  Administered 2016-01-21: 500 mL via INTRAVENOUS

## 2016-01-21 MED ORDER — HYDROMORPHONE HCL 1 MG/ML IJ SOLN
0.5000 mg | INTRAMUSCULAR | Status: DC | PRN
  Administered 2016-01-22 – 2016-01-24 (×13): 0.5 mg via INTRAVENOUS
  Filled 2016-01-21 (×13): qty 1

## 2016-01-21 MED ORDER — ONDANSETRON HCL 4 MG/2ML IJ SOLN
4.0000 mg | Freq: Once | INTRAMUSCULAR | Status: AC | PRN
  Administered 2016-01-21: 4 mg via INTRAVENOUS

## 2016-01-21 MED ORDER — POTASSIUM CHLORIDE 10 MEQ/100ML IV SOLN
10.0000 meq | INTRAVENOUS | Status: AC
  Administered 2016-01-21 – 2016-01-22 (×4): 10 meq via INTRAVENOUS
  Filled 2016-01-21 (×4): qty 100

## 2016-01-21 MED ORDER — APIXABAN 5 MG PO TABS
5.0000 mg | ORAL_TABLET | Freq: Two times a day (BID) | ORAL | Status: DC
  Administered 2016-01-21 – 2016-01-25 (×8): 5 mg via ORAL
  Filled 2016-01-21 (×8): qty 1

## 2016-01-21 MED ORDER — ONDANSETRON HCL 4 MG/2ML IJ SOLN
INTRAMUSCULAR | Status: AC
  Administered 2016-01-21: 4 mg via INTRAVENOUS
  Filled 2016-01-21: qty 2

## 2016-01-21 MED ORDER — METOPROLOL TARTRATE 25 MG PO TABS
25.0000 mg | ORAL_TABLET | Freq: Two times a day (BID) | ORAL | Status: DC
  Administered 2016-01-21 – 2016-01-25 (×8): 25 mg via ORAL
  Filled 2016-01-21 (×8): qty 1

## 2016-01-21 MED ORDER — RIFAXIMIN 550 MG PO TABS
550.0000 mg | ORAL_TABLET | Freq: Two times a day (BID) | ORAL | Status: DC
  Administered 2016-01-21 – 2016-01-25 (×8): 550 mg via ORAL
  Filled 2016-01-21 (×8): qty 1

## 2016-01-21 MED ORDER — SODIUM CHLORIDE 0.9% FLUSH
3.0000 mL | Freq: Two times a day (BID) | INTRAVENOUS | Status: DC
  Administered 2016-01-21 – 2016-01-25 (×6): 3 mL via INTRAVENOUS

## 2016-01-21 MED ORDER — SODIUM CHLORIDE 0.9 % IV SOLN
INTRAVENOUS | Status: AC
  Administered 2016-01-21: 23:00:00 via INTRAVENOUS

## 2016-01-21 MED ORDER — PROCHLORPERAZINE EDISYLATE 5 MG/ML IJ SOLN
10.0000 mg | INTRAMUSCULAR | Status: DC | PRN
  Administered 2016-01-21 – 2016-01-25 (×15): 10 mg via INTRAVENOUS
  Filled 2016-01-21 (×17): qty 2

## 2016-01-21 NOTE — ED Notes (Signed)
Admitting MD at bedside.

## 2016-01-21 NOTE — ED Provider Notes (Signed)
Time Seen: Approximately 1835 I have reviewed the triage notes  Chief Complaint: Diarrhea and Abdominal Pain   History of Present Illness: [name] is a 60 y.o. female who is a frequent visitor here to the hospital. Patient was just recently discharged after a thorough evaluation of some epigastric abdominal pain. The patient states that her main concern today is some persistent diarrhea which she states that she had while she was in the hospital. Review the records shows that she had a negative stool sample here. She denies any fever. She's tried over-the-counter Pepto-Bismol along with Imodium without significant relief of her diarrhea. She describes it as a loose watery stool. She also has typical persistent nausea and vomiting. She states she vomited 3 times with no blood or bile. The patient's epigastric abdominal pain also seems to be consistent with her past visit. She's had an upper endoscopic exam which showed some delayed emptying of the stomach and some diffuse colitis on colonoscopy.   Past Medical History  Diagnosis Date  . Type 1 diabetes ([place])     on levemir  . Hypothyroid   . Degenerative disk disease   . Stomach ulcer   . Diverticulitis   . Syncope 01/2015  . Anxiety   . GERD (gastroesophageal reflux disease)   . History of hiatal hernia   . Cancer (HCC)     HX OF CANCER OF UTERUS   . TIA (transient ischemic attack) 02/2015  . Hypertension   . Pancreatitis   . PAF (paroxysmal atrial fibrillation) ([place]) 03/2015    a. new onset 03/2015 in setting of intractable N/V; b. on Eliquis 5 mg bid; c. CHADSVASc 4 (DM, TIA x 2, female)  . Intussusception intestine ([place]) 05/2015  . Stroke [facility])     with minimal left sided weakness  . Allergy   . Cirrhosis of liver not due to alcohol ([place]) 2016  . Gastroparesis   . Sick sinus syndrome [facility])     Patient Active Problem List   Diagnosis Date Noted  . Abdominal pain, epigastric   . Personal history of surgery to heart  and great vessels, presenting hazards to health   . Gastritis   . Foreign body in stomach   . Abnormal findings-gastrointestinal tract   . Diarrhea   . PAF (paroxysmal atrial fibrillation) ([place])   . Congestive dilated cardiomyopathy ([place])   . Generalized abdominal pain   . Nausea & vomiting   . NSTEMI (non-ST elevated myocardial infarction) ([place]) 01/11/2016  . Tachyarrhythmia 01/10/2016  . Intractable nausea and vomiting 01/10/2016  . Tachy-brady syndrome ([place]) 12/28/2015  . Abdominal pain 12/27/2015  . Colitis 12/16/2015  . Pneumonia 11/14/2015  . Narcotic withdrawal ([place]) 11/11/2015  . Diabetes mellitus type 2, insulin dependent ([place]) 11/11/2015  . Orthostatic hypotension   . H/O TIA (transient ischemic attack) and stroke   . Left-sided weakness 10/04/2015  . Expressive aphasia 10/04/2015  . Ileus ([place]) 08/01/2015  . Ascites   . Cryptogenic cirrhosis ([place]) 07/10/2015  . Paroxysmal atrial fibrillation ([place]) 07/10/2015  . C. difficile colitis 07/10/2015  . GI (gastrointestinal bleed) 07/06/2015  . Malnutrition of moderate degree 07/04/2015  . Symptomatic bradycardia 05/27/2015  . Intussusception intestine ([place])   . Hypothyroidism due to amiodarone   . Abdominal pain, chronic, epigastric   . Chronic anemia 05/19/2015  . Thrombocytopenia ([place]) 05/19/2015  . Prolonged QT interval   . Hypomagnesemia   . Narcotic abuse   . Type 2 diabetes mellitus without complication (  [place])   . Syncope due to orthostatic hypotension 05/18/2015  . Hypokalemia 04/06/2015  . Hyperlipidemia with target LDL less than 100 04/06/2015  . CVA (cerebral infarction) 02/15/2015  . Essential hypertension 01/12/2015  . Chronically on opiate therapy 01/12/2015  . Gastroparesis 01/12/2015  . DEPRESSION/ANXIETY 06/27/2007  . MYOFASCIAL PAIN SYNDROME 06/27/2007  . Chronic pain syndrome 03/28/2007  . GERD 03/27/2007  . DIVERTICULOSIS, COLON 03/27/2007  . LUMBAR DISC DISPLACEMENT 03/27/2007  . PROTEINURIA  03/27/2007  . UTERINE CANCER, HX OF 03/27/2007    Past Surgical History  Procedure Laterality Date  . Hernia repair    . Abdominal hysterectomy    . Cholecystectomy    . Esophagogastroduodenoscopy N/A 04/04/2015    Procedure: ESOPHAGOGASTRODUODENOSCOPY (EGD);  Surgeon: Hulen Luster, MD;  Location: [facility] ENDOSCOPY;  Service: Endoscopy;  Laterality: N/A;  . Cardiac catheterization N/A 01/12/2016    Procedure: Left Heart Cath and Coronary Angiography;  Surgeon: Wellington Hampshire, MD;  Location: [place] CV LAB;  Service: Cardiovascular;  Laterality: N/A;  . Esophagogastroduodenoscopy (egd) with propofol N/A 01/18/2016    Procedure: ESOPHAGOGASTRODUODENOSCOPY (EGD) WITH PROPOFOL;  Surgeon: Lucilla Lame, MD;  Location: [facility] ENDOSCOPY;  Service: Endoscopy;  Laterality: N/A;  . Flexible sigmoidoscopy N/A 01/18/2016    Procedure: FLEXIBLE SIGMOIDOSCOPY;  Surgeon: Lucilla Lame, MD;  Location: [facility] ENDOSCOPY;  Service: Endoscopy;  Laterality: N/A;    Past Surgical History  Procedure Laterality Date  . Hernia repair    . Abdominal hysterectomy    . Cholecystectomy    . Esophagogastroduodenoscopy N/A 04/04/2015    Procedure: ESOPHAGOGASTRODUODENOSCOPY (EGD);  Surgeon: Hulen Luster, MD;  Location: [facility] ENDOSCOPY;  Service: Endoscopy;  Laterality: N/A;  . Cardiac catheterization N/A 01/12/2016    Procedure: Left Heart Cath and Coronary Angiography;  Surgeon: Wellington Hampshire, MD;  Location: [place] CV LAB;  Service: Cardiovascular;  Laterality: N/A;  . Esophagogastroduodenoscopy (egd) with propofol N/A 01/18/2016    Procedure: ESOPHAGOGASTRODUODENOSCOPY (EGD) WITH PROPOFOL;  Surgeon: Lucilla Lame, MD;  Location: [facility] ENDOSCOPY;  Service: Endoscopy;  Laterality: N/A;  . Flexible sigmoidoscopy N/A 01/18/2016    Procedure: FLEXIBLE SIGMOIDOSCOPY;  Surgeon: Lucilla Lame, MD;  Location: [facility] ENDOSCOPY;  Service: Endoscopy;  Laterality: N/A;    Current Outpatient Rx  Name  Route  Sig  Dispense  Refill  .  albuterol (PROVENTIL HFA;VENTOLIN HFA) 108 (90 Base) MCG/ACT inhaler   Inhalation   Inhale 2 puffs into the lungs every 6 (six) hours as needed for wheezing or shortness of breath.   1 Inhaler   2   . amLODipine (NORVASC) 5 MG tablet   Oral   Take 5 mg by mouth daily.         Marland Kitchen apixaban (ELIQUIS) 5 MG TABS tablet   Oral   Take 1 tablet (5 mg total) by mouth 2 (two) times daily.   60 tablet   0   . atorvastatin (LIPITOR) 40 MG tablet   Oral   Take 40 mg by mouth at bedtime.         . benzonatate (TESSALON) 200 MG capsule   Oral   Take 1 capsule (200 mg total) by mouth 3 (three) times daily as needed for cough.   20 capsule   0   . budesonide-formoterol (SYMBICORT) 160-4.5 MCG/ACT inhaler   Inhalation   Inhale 2 puffs into the lungs 2 (two) times daily.   1 Inhaler   12   . citalopram (CELEXA) 20 MG tablet   Oral  Take 20 mg by mouth daily.         Marland Kitchen dicyclomine (BENTYL) 10 MG capsule   Oral   Take 10 mg by mouth 4 (four) times daily -  before meals and at bedtime.          . insulin aspart (NOVOLOG) 100 UNIT/ML injection   Subcutaneous   Inject into the skin 3 (three) times daily with meals as needed for high blood sugar. Pt uses as needed per sliding scale.         . insulin detemir (LEVEMIR) 100 UNIT/ML injection   Subcutaneous   Inject 17 Units into the skin at bedtime.         Marland Kitchen levothyroxine (SYNTHROID, LEVOTHROID) 75 MCG tablet   Oral   Take 1 tablet (75 mcg total) by mouth daily before breakfast.   30 tablet   1   . magnesium oxide (MAG-OX) 400 (241.3 Mg) MG tablet   Oral   Take 1 tablet (400 mg total) by mouth 2 (two) times daily.   10 tablet   0   . metoCLOPramide (REGLAN) 5 MG tablet   Oral   Take 1 tablet (5 mg total) by mouth 4 (four) times daily -  before meals and at bedtime.   90 tablet   0   . metoprolol tartrate (LOPRESSOR) 25 MG tablet   Oral   Take 25 mg by mouth 2 (two) times daily.          Marland Kitchen morphine (MS CONTIN)  15 MG 12 hr tablet   Oral   Take 1 tablet (15 mg total) by mouth every 8 (eight) hours.   75 tablet   0   . oxyCODONE (OXY IR/ROXICODONE) 5 MG immediate release tablet   Oral   Take 5-10 mg by mouth every 6 (six) hours as needed for severe pain.         . pantoprazole (PROTONIX) 40 MG tablet   Oral   Take 40 mg by mouth 2 (two) times daily before a meal.         . rifaximin (XIFAXAN) 550 MG TABS tablet   Oral   Take 1 tablet (550 mg total) by mouth 2 (two) times daily.   60 tablet   6   . tiZANidine (ZANAFLEX) 4 MG tablet   Oral   Take 4 mg by mouth 3 (three) times daily as needed for muscle spasms.            Allergies:  Hydrocodone; Erythromycin; Prednisone; Rosiglitazone maleate; Codeine sulfate; and Tetanus-diphtheria toxoids td  Family History: Family History  Problem Relation Age of Onset  . Hypertension Mother   . CAD Sister   . Heart attack Sister     Deceased Nov 27, 2014  . CAD Brother     Social History: Social History  Substance Use Topics  . Smoking status: Never Smoker   . Smokeless tobacco: Never Used  . Alcohol Use: No     Review of Systems:   10 point review of systems was performed and was otherwise negative:  Constitutional: No fever Eyes: No visual disturbances ENT: No sore throat, ear pain Cardiac: No chest pain Respiratory: No shortness of breath, wheezing, or stridor Abdomen: Epigastric abdominal pain with persistent nausea and occasional vomiting and loose watery stool Endocrine: No weight loss, No night sweats Extremities: No peripheral edema, cyanosis Skin: No rashes, easy bruising Neurologic: No focal weakness, trouble with speech or swollowing Urologic: She states she does have  some burning with urination, no Hematuria, or urinary frequency  No hematemesis or biliary emesis Physical Exam:  ED Triage Vitals  Enc Vitals Group     BP 01/21/16 1550 167/87 mmHg     Pulse Rate 01/21/16 1550 116     Resp 01/21/16 1550 18      Temp 01/21/16 1550 98.4 F (36.9 C)     Temp Source 01/21/16 1550 Oral     SpO2 01/21/16 1550 98 %     Weight 01/21/16 1550 135 lb (61.236 kg)     Height 01/21/16 1550 5' 3"  (1.6 m)     Head Cir --      Peak Flow --      Pain Score 01/21/16 1547 10     Pain Loc --      Pain Edu? --      Excl. in [place]? --     General: Awake , Alert , and Oriented times 3; GCS 15 Head: Normal cephalic , atraumatic Eyes: Pupils equal , round, reactive to light Nose/Throat: No nasal drainage, patent upper airway without erythema or exudate.  Neck: Supple, Full range of motion, No anterior adenopathy or palpable thyroid masses Lungs: Clear to ascultation without wheezes , rhonchi, or rales Heart: Regular rate, regular rhythm without murmurs , gallops , or rubs Abdomen: Tender in the epigastric area without guarding , or rigidity; bowel sounds positive and symmetric in all 4 quadrants. No organomegaly .        Extremities: 2 plus symmetric pulses. No edema, clubbing or cyanosis Neurologic: normal ambulation, Motor symmetric without deficits, sensory intact Skin: warm, dry, no rashes   Labs:   All laboratory work was reviewed including any pertinent negatives or positives listed below:  Labs Reviewed  COMPREHENSIVE METABOLIC PANEL - Abnormal; Notable for the following:    Potassium 3.0 (*)    Glucose, Bld 225 (*)    Total Bilirubin 1.3 (*)    All other components within normal limits  CBC - Abnormal; Notable for the following:    Hemoglobin 11.9 (*)    RDW 16.7 (*)    All other components within normal limits  GASTROINTESTINAL PANEL BY PCR, STOOL (REPLACES STOOL CULTURE)  LIPASE, BLOOD  URINALYSIS COMPLETEWITH MICROSCOPIC ([facility] ONLY)  Patient's potassium is low otherwise laboratory work appears to be within normal limits  EKG:  ED ECG REPORT I, Daymon Larsen, the attending physician, personally viewed and interpreted this ECG.  Date: 01/21/2016 EKG Time: 1548 Rate: 116 Rhythm: normal  sinus rhythm QRS Axis: normal Intervals: normal ST/T Wave abnormalities: Prolonged QT interval Conduction Disturbances: none Narrative Interpretation: unremarkable Poor R-wave progression noticed in the septal leads with no acute ischemic changes  Radiology:   Narrative:    CLINICAL DATA: Epigastric pains starting 01/19/2016  EXAM: DG ABDOMEN ACUTE W/ 1V CHEST  COMPARISON: CT scan 01/15/2016  FINDINGS: There is moderate gaseous distension of the stomach suspicious for gastric ileus or paresis. Mild gaseous distended small bowel loops in mid abdomen. Ileus or early small bowel obstruction cannot be excluded. No evidence of free abdominal air. Cardiomediastinal silhouette is unremarkable. There is chronic elevation of the right hemidiaphragm. No acute infiltrate or pulmonary edema.  IMPRESSION: There is moderate gaseous distension of the stomach suspicious for gastric ileus or paresis. Mild gaseous distended small bowel loops in mid abdomen. Ileus or early small bowel obstruction cannot be excluded. No evidence of free abdominal air. No acute disease within chest.   Electronically Signed By: Julien Girt  Pop M.D.      I personally reviewed the radiologic studies    ED Course:  Reexamined sedation of the patient shows persistent epigastric abdominal pain. She seems to be resting comfortably though still states has significant pain on repeat examination. Unknown source for her discomfort at this time though she does have some signs and ileus per three-way of the abdomen. Patient's recently had an abdominal pelvic CT and I felt it was too much radiation to repeat that at this time. Due to the persistent nature of her pain I spoke to the hospitalist team, further disposition and management depends upon her evaluation.    Assessment: * Acute exacerbation of epigastric abdominal pain    Plan:  Inpatient management            Daymon Larsen, MD 01/21/16  2135

## 2016-01-21 NOTE — ED Notes (Signed)
Pts son, [place] [phone]

## 2016-01-21 NOTE — ED Notes (Signed)
Patient transported to radiology

## 2016-01-21 NOTE — H&P (Signed)
[place] [facility] [place] NAME: [name]    MR#:  [id number]  DATE OF BIRTH:  12/17/1955  DATE OF ADMISSION:  01/21/2016  PRIMARY CARE PHYSICIAN: Maryland Pink, MD   REQUESTING/REFERRING PHYSICIAN: Marcelene Butte, MD  CHIEF COMPLAINT:   Chief Complaint  Patient presents with  . Diarrhea  . Abdominal Pain    HISTORY OF PRESENT ILLNESS:  [name]  is a 60 y.o. female who presents with Abdominal pain and nausea and vomiting. Patient was just discharged from this hospital and has had frequent admissions for the same over the last several months, with extensive workup. She most recently had a gastric emptying study which showed gastroparesis. She also recently was treated for colitis, but shows no evidence of this today. Imaging in the ED today does show likely ileus versus gastroparesis. Hospitals were called for admission.  PAST MEDICAL HISTORY:   Past Medical History  Diagnosis Date  . Type 1 diabetes ([place])     on levemir  . Hypothyroid   . Degenerative disk disease   . Stomach ulcer   . Diverticulitis   . Syncope 01/2015  . Anxiety   . GERD (gastroesophageal reflux disease)   . History of hiatal hernia   . Cancer (HCC)     HX OF CANCER OF UTERUS   . TIA (transient ischemic attack) 02/2015  . Hypertension   . Pancreatitis   . PAF (paroxysmal atrial fibrillation) ([place]) 03/2015    a. new onset 03/2015 in setting of intractable N/V; b. on Eliquis 5 mg bid; c. CHADSVASc 4 (DM, TIA x 2, female)  . Intussusception intestine ([place]) 05/2015  . Stroke [facility])     with minimal left sided weakness  . Allergy   . Cirrhosis of liver not due to alcohol ([place]) 2014-11-23  . Gastroparesis   . Sick sinus syndrome ([place])     PAST SURGICAL HISTORY:   Past Surgical History  Procedure Laterality Date  . Hernia repair    . Abdominal hysterectomy    . Cholecystectomy    . Esophagogastroduodenoscopy N/A 04/04/2015    Procedure:  ESOPHAGOGASTRODUODENOSCOPY (EGD);  Surgeon: Hulen Luster, MD;  Location: [facility] ENDOSCOPY;  Service: Endoscopy;  Laterality: N/A;  . Cardiac catheterization N/A 01/12/2016    Procedure: Left Heart Cath and Coronary Angiography;  Surgeon: Wellington Hampshire, MD;  Location: [facility] CV LAB;  Service: Cardiovascular;  Laterality: N/A;  . Esophagogastroduodenoscopy (egd) with propofol N/A 01/18/2016    Procedure: ESOPHAGOGASTRODUODENOSCOPY (EGD) WITH PROPOFOL;  Surgeon: Lucilla Lame, MD;  Location: [facility] ENDOSCOPY;  Service: Endoscopy;  Laterality: N/A;  . Flexible sigmoidoscopy N/A 01/18/2016    Procedure: FLEXIBLE SIGMOIDOSCOPY;  Surgeon: Lucilla Lame, MD;  Location: [facility] ENDOSCOPY;  Service: Endoscopy;  Laterality: N/A;    SOCIAL HISTORY:   Social History  Substance Use Topics  . Smoking status: Never Smoker   . Smokeless tobacco: Never Used  . Alcohol Use: No    FAMILY HISTORY:   Family History  Problem Relation Age of Onset  . Hypertension Mother   . CAD Sister   . Heart attack Sister     Deceased Nov 23, 2014  . CAD Brother     DRUG ALLERGIES:   Allergies  Allergen Reactions  . Hydrocodone Other (See Comments)    Pt states that this medication caused cirrhosis of the liver.    . Erythromycin Other (See Comments)    Reaction:  Fever   . Prednisone Other (See  Comments)    Reaction:  Unknown   . Rosiglitazone Maleate Swelling  . Codeine Sulfate Rash  . Tetanus-Diphtheria Toxoids Td Rash and Other (See Comments)    Reaction:  Fever     MEDICATIONS AT HOME:   Prior to Admission medications   Medication Sig Start Date End Date Taking? Authorizing Provider  albuterol (PROVENTIL HFA;VENTOLIN HFA) 108 (90 Base) MCG/ACT inhaler Inhale 2 puffs into the lungs every 6 (six) hours as needed for wheezing or shortness of breath. 12/19/15   Gladstone Lighter, MD  amLODipine (NORVASC) 5 MG tablet Take 5 mg by mouth daily.    Historical Provider, MD  apixaban (ELIQUIS) 5 MG TABS tablet Take 1  tablet (5 mg total) by mouth 2 (two) times daily. 04/07/15   Bettey Costa, MD  atorvastatin (LIPITOR) 40 MG tablet Take 40 mg by mouth at bedtime.    Historical Provider, MD  benzonatate (TESSALON) 200 MG capsule Take 1 capsule (200 mg total) by mouth 3 (three) times daily as needed for cough. 12/19/15   Gladstone Lighter, MD  budesonide-formoterol (SYMBICORT) 160-4.5 MCG/ACT inhaler Inhale 2 puffs into the lungs 2 (two) times daily. 12/19/15   Gladstone Lighter, MD  citalopram (CELEXA) 20 MG tablet Take 20 mg by mouth daily.    Historical Provider, MD  dicyclomine (BENTYL) 10 MG capsule Take 10 mg by mouth 4 (four) times daily -  before meals and at bedtime.     Historical Provider, MD  insulin aspart (NOVOLOG) 100 UNIT/ML injection Inject into the skin 3 (three) times daily with meals as needed for high blood sugar. Pt uses as needed per sliding scale.    Historical Provider, MD  insulin detemir (LEVEMIR) 100 UNIT/ML injection Inject 17 Units into the skin at bedtime.    Historical Provider, MD  levothyroxine (SYNTHROID, LEVOTHROID) 75 MCG tablet Take 1 tablet (75 mcg total) by mouth daily before breakfast. 10/08/15   Loleta Chance, MD  magnesium oxide (MAG-OX) 400 (241.3 Mg) MG tablet Take 1 tablet (400 mg total) by mouth 2 (two) times daily. 11/16/15   Belkys A Regalado, MD  metoCLOPramide (REGLAN) 5 MG tablet Take 1 tablet (5 mg total) by mouth 4 (four) times daily -  before meals and at bedtime. 12/19/15   Gladstone Lighter, MD  metoprolol tartrate (LOPRESSOR) 25 MG tablet Take 25 mg by mouth 2 (two) times daily.     Historical Provider, MD  morphine (MS CONTIN) 15 MG 12 hr tablet Take 1 tablet (15 mg total) by mouth every 8 (eight) hours. 10/21/15   Lance Bosch, MD  oxyCODONE (OXY IR/ROXICODONE) 5 MG immediate release tablet Take 5-10 mg by mouth every 6 (six) hours as needed for severe pain.    Historical Provider, MD  pantoprazole (PROTONIX) 40 MG tablet Take 40 mg by mouth 2 (two) times daily before a  meal.    Historical Provider, MD  rifaximin (XIFAXAN) 550 MG TABS tablet Take 1 tablet (550 mg total) by mouth 2 (two) times daily. 07/11/15   Theodoro Grist, MD  tiZANidine (ZANAFLEX) 4 MG tablet Take 4 mg by mouth 3 (three) times daily as needed for muscle spasms.     Historical Provider, MD    REVIEW OF SYSTEMS:  Review of Systems  Constitutional: Negative for fever, chills, weight loss and malaise/fatigue.  HENT: Negative for ear pain, hearing loss and tinnitus.   Eyes: Negative for blurred vision, double vision, pain and redness.  Respiratory: Negative for cough, hemoptysis and shortness of breath.  Cardiovascular: Negative for chest pain, palpitations, orthopnea and leg swelling.  Gastrointestinal: Positive for nausea, vomiting and abdominal pain. Negative for diarrhea and constipation.  Genitourinary: Negative for dysuria, frequency and hematuria.  Musculoskeletal: Negative for back pain, joint pain and neck pain.  Skin:       No acne, rash, or lesions  Neurological: Negative for dizziness, tremors, focal weakness and weakness.  Endo/Heme/Allergies: Negative for polydipsia. Does not bruise/bleed easily.  Psychiatric/Behavioral: Negative for depression. The patient is not nervous/anxious and does not have insomnia.      VITAL SIGNS:   Filed Vitals:   01/21/16 1924 01/21/16 2000 01/21/16 2030 01/21/16 2100  BP: 180/93 175/97 180/89 157/88  Pulse: 119 114 116 117  Temp:      TempSrc:      Resp: 16 11 18 14   Height:      Weight:      SpO2: 96% 98% 95% 97%   Wt Readings from Last 3 Encounters:  01/21/16 61.236 kg (135 lb)  01/15/16 61.825 kg (136 lb 4.8 oz)  01/13/16 62.1 kg (136 lb 14.5 oz)    PHYSICAL EXAMINATION:  Physical Exam  Vitals reviewed. Constitutional: She is oriented to person, place, and time. She appears well-developed and well-nourished. No distress.  HENT:  Head: Normocephalic and atraumatic.  Mouth/Throat: Oropharynx is clear and moist.  Eyes:  Conjunctivae and EOM are normal. Pupils are equal, round, and reactive to light. No scleral icterus.  Neck: Normal range of motion. Neck supple. No JVD present. No thyromegaly present.  Cardiovascular: Normal rate, regular rhythm and intact distal pulses.  Exam reveals no gallop and no friction rub.   No murmur heard. Respiratory: Effort normal and breath sounds normal. No respiratory distress. She has no wheezes. She has no rales.  GI: Soft. She exhibits no distension. There is tenderness (Epigastric).  High-pitched bowel crescendo sounds  Musculoskeletal: Normal range of motion. She exhibits no edema.  No arthritis, no gout  Lymphadenopathy:    She has no cervical adenopathy.  Neurological: She is alert and oriented to person, place, and time. No cranial nerve deficit.  No dysarthria, no aphasia  Skin: Skin is warm and dry. No rash noted. No erythema.  Psychiatric: She has a normal mood and affect. Her behavior is normal. Judgment and thought content normal.    LABORATORY PANEL:   CBC  Recent Labs Lab 01/21/16 1619  WBC 7.6  HGB 11.9*  HCT 36.8  PLT 150   ------------------------------------------------------------------------------------------------------------------  Chemistries   Recent Labs Lab 01/18/16 0402 01/21/16 1619  NA 141 136  K 3.9 3.0*  CL 111 102  CO2 25 23  GLUCOSE 92 225*  BUN 8 13  CREATININE 0.61 0.70  CALCIUM 8.4* 9.2  MG 1.9  --   AST  --  24  ALT  --  19  ALKPHOS  --  84  BILITOT  --  1.3*   ------------------------------------------------------------------------------------------------------------------  Cardiac Enzymes  Recent Labs Lab 01/15/16 0916  TROPONINI 0.05*   ------------------------------------------------------------------------------------------------------------------  RADIOLOGY:  Dg Abd Acute W/chest  01/21/2016  CLINICAL DATA:  Epigastric pains starting 01/19/2016 EXAM: DG ABDOMEN ACUTE W/ 1V CHEST COMPARISON:   CT scan 01/15/2016 FINDINGS: There is moderate gaseous distension of the stomach suspicious for gastric ileus or paresis. Mild gaseous distended small bowel loops in mid abdomen. Ileus or early small bowel obstruction cannot be excluded. No evidence of free abdominal air. Cardiomediastinal silhouette is unremarkable. There is chronic elevation of the right hemidiaphragm. No acute  infiltrate or pulmonary edema. IMPRESSION: There is moderate gaseous distension of the stomach suspicious for gastric ileus or paresis. Mild gaseous distended small bowel loops in mid abdomen. Ileus or early small bowel obstruction cannot be excluded. No evidence of free abdominal air. No acute disease within chest. Electronically Signed   By: Lahoma Crocker M.D.   On: 01/21/2016 19:12    EKG:   Orders placed or performed during the hospital encounter of 01/21/16  . EKG 12-Lead  . EKG 12-Lead  . ED EKG  . ED EKG    IMPRESSION AND PLAN:  Principal Problem:   Uncontrolled type 2 diabetes mellitus with gastroparesis (HCC) - sliding scale insulin with corresponding glucose checks every 6 hours while nothing by mouth.  Control her nausea with when necessary antiemetics. Some difficulty in prescribing prokinetics at this time as they tend to prolong her QT interval, and at baseline right now she has QTc of almost 600. We will recheck her EKG in the morning to monitor QTc and see if this improved enough in order to prescribe Reglan. Active Problems:   Prolonged QT interval - telemetry and AM QTc monitoring as above   DEPRESSION/ANXIETY - her home dose antidepressant may also prolonged QT interval, will hold for now   Essential hypertension - continue home meds   Hypokalemia - replace, check potassium, monitor   Paroxysmal atrial fibrillation ([place]) - continue rate controlling medication and anticoagulation  All the records are reviewed and case discussed with ED provider. Management plans discussed with the patient and/or  family.  DVT PROPHYLAXIS: Systemic anticoagulation  GI PROPHYLAXIS: None  ADMISSION STATUS: Inpatient  CODE STATUS: DNR Code Status History    Date Active Date Inactive Code Status Order ID Comments User Context   01/15/2016  1:39 PM 01/19/2016  3:29 PM DNR [id number]  Gladstone Lighter, MD ED   01/10/2016 10:00 PM 01/13/2016  5:24 PM DNR [id number]  Harrie Foreman, MD Inpatient   12/27/2015 11:02 PM 01/01/2016  6:17 PM DNR [id number]  Max Sane, MD Inpatient   12/16/2015  4:54 PM 12/19/2015  4:52 PM DNR [id number]  Loletha Grayer, MD ED   11/11/2015 11:36 AM 11/16/2015  2:05 PM Full Code [id number]  Samella Parr, NP Inpatient   10/15/2015  1:23 AM 10/16/2015  6:17 PM Full Code [id number]  Lance Coon, MD Inpatient   10/04/2015  8:19 PM 10/08/2015  6:34 PM Full Code [id number]  Bethena Roys, MD Inpatient   08/15/2015  1:05 AM 08/16/2015  8:04 PM Full Code [id number]  Harrie Foreman, MD Inpatient   08/01/2015  1:25 AM 08/06/2015  5:41 PM Full Code [id number]  Harrie Foreman, MD Inpatient   07/04/2015  2:53 AM 07/11/2015  6:33 PM Full Code [id number]  Lytle Butte, MD ED   05/27/2015 10:48 PM 05/31/2015  2:37 PM Full Code [id number]  Rise Patience, MD Inpatient   05/19/2015  1:14 AM 05/22/2015  4:28 PM Full Code [id number]  Rise Patience, MD Inpatient   04/05/2015  9:16 AM 04/07/2015  5:23 PM Full Code [id number]  Bettey Costa, MD Inpatient   04/03/2015 12:31 PM 04/05/2015  9:16 AM Full Code [id number]  Bettey Costa, MD Inpatient   03/11/2015  4:49 PM 03/14/2015  3:50 PM Full Code [id number]  Dustin Flock, MD Inpatient   02/15/2015  4:51 PM 02/17/2015  2:33 PM DNR [id number]  Dustin Flock, MD Inpatient   02/15/2015  4:35 PM 02/15/2015  4:51 PM Full Code [id number]  Dustin Flock, MD Inpatient   01/11/2015  2:40 AM 01/12/2015  6:11 PM Full Code [id number]  Etta Quill, DO ED    Questions for Most Recent Historical Code Status (Order [id number])    Question Answer Comment   In the event of cardiac or respiratory  ARREST Do not call a "code blue"    In the event of cardiac or respiratory ARREST Do not perform Intubation, CPR, defibrillation or ACLS    In the event of cardiac or respiratory ARREST Use medication by any route, position, wound care, and other measures to relive pain and suffering. May use oxygen, suction and manual treatment of airway obstruction as needed for comfort.       TOTAL TIME TAKING CARE OF THIS PATIENT: 45 minutes.    [name] [place] 01/21/2016, 9:33 PM  [facility]  Office  [phone]  CC: Primary care physician; [place] Pink, MD

## 2016-01-21 NOTE — ED Notes (Signed)
C/O vomiting and diarrhea, sharp abdominal pain.  Recently discharged from hospital.

## 2016-01-22 ENCOUNTER — Inpatient Hospital Stay: Payer: Commercial Managed Care - HMO

## 2016-01-22 ENCOUNTER — Encounter: Payer: Self-pay | Admitting: Radiology

## 2016-01-22 DIAGNOSIS — R1013 Epigastric pain: Secondary | ICD-10-CM

## 2016-01-22 LAB — GASTROINTESTINAL PANEL BY PCR, STOOL (REPLACES STOOL CULTURE)
ADENOVIRUS F40/41: NOT DETECTED
Astrovirus: NOT DETECTED
CAMPYLOBACTER SPECIES: NOT DETECTED
CRYPTOSPORIDIUM: NOT DETECTED
CYCLOSPORA CAYETANENSIS: NOT DETECTED
E. coli O157: NOT DETECTED
ENTEROPATHOGENIC E COLI (EPEC): NOT DETECTED
Entamoeba histolytica: NOT DETECTED
Enteroaggregative E coli (EAEC): NOT DETECTED
Enterotoxigenic E coli (ETEC): NOT DETECTED
Giardia lamblia: NOT DETECTED
Norovirus GI/GII: NOT DETECTED
PLESIMONAS SHIGELLOIDES: NOT DETECTED
ROTAVIRUS A: NOT DETECTED
SALMONELLA SPECIES: NOT DETECTED
SAPOVIRUS (I, II, IV, AND V): NOT DETECTED
SHIGELLA/ENTEROINVASIVE E COLI (EIEC): NOT DETECTED
Shiga like toxin producing E coli (STEC): NOT DETECTED
VIBRIO SPECIES: NOT DETECTED
Vibrio cholerae: NOT DETECTED
YERSINIA ENTEROCOLITICA: NOT DETECTED

## 2016-01-22 LAB — BASIC METABOLIC PANEL
ANION GAP: 5 (ref 5–15)
BUN: 13 mg/dL (ref 6–20)
CO2: 23 mmol/L (ref 22–32)
Calcium: 8.3 mg/dL — ABNORMAL LOW (ref 8.9–10.3)
Chloride: 108 mmol/L (ref 101–111)
Creatinine, Ser: 0.49 mg/dL (ref 0.44–1.00)
GLUCOSE: 146 mg/dL — AB (ref 65–99)
POTASSIUM: 3.9 mmol/L (ref 3.5–5.1)
SODIUM: 136 mmol/L (ref 135–145)

## 2016-01-22 LAB — CBC
HCT: 25.6 % — ABNORMAL LOW (ref 35.0–47.0)
Hemoglobin: 8.3 g/dL — ABNORMAL LOW (ref 12.0–16.0)
MCH: 27 pg (ref 26.0–34.0)
MCHC: 32.4 g/dL (ref 32.0–36.0)
MCV: 83.3 fL (ref 80.0–100.0)
PLATELETS: 87 10*3/uL — AB (ref 150–440)
RBC: 3.08 MIL/uL — AB (ref 3.80–5.20)
RDW: 16.6 % — AB (ref 11.5–14.5)
WBC: 3.9 10*3/uL (ref 3.6–11.0)

## 2016-01-22 LAB — PROTIME-INR
INR: 1.55
Prothrombin Time: 18.6 seconds — ABNORMAL HIGH (ref 11.4–15.0)

## 2016-01-22 LAB — GLUCOSE, CAPILLARY
GLUCOSE-CAPILLARY: 137 mg/dL — AB (ref 65–99)
GLUCOSE-CAPILLARY: 110 mg/dL — AB (ref 65–99)
GLUCOSE-CAPILLARY: 115 mg/dL — AB (ref 65–99)
GLUCOSE-CAPILLARY: 146 mg/dL — AB (ref 65–99)

## 2016-01-22 LAB — HEMOGLOBIN A1C: HEMOGLOBIN A1C: 7.2 % — AB (ref 4.0–6.0)

## 2016-01-22 LAB — C DIFFICILE QUICK SCREEN W PCR REFLEX
C DIFFICILE (CDIFF) INTERP: NEGATIVE
C DIFFICILE (CDIFF) TOXIN: NEGATIVE
C DIFFICLE (CDIFF) ANTIGEN: NEGATIVE

## 2016-01-22 LAB — APTT: APTT: 32 s (ref 24–36)

## 2016-01-22 MED ORDER — DIATRIZOATE MEGLUMINE & SODIUM 66-10 % PO SOLN
15.0000 mL | ORAL | Status: AC
  Administered 2016-01-22 (×2): 15 mL via ORAL

## 2016-01-22 MED ORDER — AMLODIPINE BESYLATE 5 MG PO TABS
5.0000 mg | ORAL_TABLET | Freq: Every day | ORAL | Status: DC
  Administered 2016-01-22 – 2016-01-25 (×4): 5 mg via ORAL
  Filled 2016-01-22 (×4): qty 1

## 2016-01-22 MED ORDER — ATORVASTATIN CALCIUM 20 MG PO TABS
40.0000 mg | ORAL_TABLET | Freq: Every day | ORAL | Status: DC
  Administered 2016-01-22 – 2016-01-24 (×3): 40 mg via ORAL
  Filled 2016-01-22 (×4): qty 2

## 2016-01-22 MED ORDER — RISAQUAD PO CAPS
2.0000 | ORAL_CAPSULE | Freq: Every day | ORAL | Status: DC
  Administered 2016-01-23 – 2016-01-25 (×3): 2 via ORAL
  Filled 2016-01-22 (×4): qty 2

## 2016-01-22 MED ORDER — SODIUM CHLORIDE 0.9 % IV SOLN
INTRAVENOUS | Status: AC
  Administered 2016-01-22 (×2): via INTRAVENOUS

## 2016-01-22 MED ORDER — INSULIN DETEMIR 100 UNIT/ML ~~LOC~~ SOLN
5.0000 [IU] | Freq: Every day | SUBCUTANEOUS | Status: DC
  Administered 2016-01-22 – 2016-01-24 (×3): 5 [IU] via SUBCUTANEOUS
  Filled 2016-01-22 (×4): qty 0.05

## 2016-01-22 MED ORDER — IOPAMIDOL (ISOVUE-300) INJECTION 61%
100.0000 mL | Freq: Once | INTRAVENOUS | Status: AC | PRN
  Administered 2016-01-22: 100 mL via INTRAVENOUS

## 2016-01-22 NOTE — Progress Notes (Signed)
[place] at [place] NAME: [name]    MR#:  [id number]  DATE OF BIRTH:  Sep 16, 1955  SUBJECTIVE:   Here with Persistent diarrhea.  REVIEW OF SYSTEMS:    Review of Systems  Constitutional: Negative for fever, chills and malaise/fatigue.  HENT: Negative for ear discharge, ear pain, hearing loss, nosebleeds and sore throat.   Eyes: Negative for blurred vision and pain.  Respiratory: Negative for cough, hemoptysis, shortness of breath and wheezing.   Cardiovascular: Negative for chest pain, palpitations and leg swelling.  Gastrointestinal: Positive for diarrhea. Negative for nausea, vomiting, abdominal pain and blood in stool.  Genitourinary: Negative for dysuria.  Musculoskeletal: Negative for back pain.  Neurological: Negative for dizziness, tremors, speech change, focal weakness, seizures and headaches.  Endo/Heme/Allergies: Does not bruise/bleed easily.  Psychiatric/Behavioral: Negative for depression, suicidal ideas and hallucinations.    Tolerating Diet: yes      DRUG ALLERGIES:   Allergies  Allergen Reactions  . Hydrocodone Other (See Comments)    Pt states that this medication caused cirrhosis of the liver.    . Erythromycin Other (See Comments)    Reaction:  Fever   . Prednisone Other (See Comments)    Reaction:  Unknown   . Rosiglitazone Maleate Swelling  . Codeine Sulfate Rash  . Tetanus-Diphtheria Toxoids Td Rash and Other (See Comments)    Reaction:  Fever     VITALS:  Blood pressure 150/76, pulse 72, temperature 97.8 F (36.6 C), temperature source Oral, resp. rate 18, height 5' 3"  (1.6 m), weight 68.856 kg (151 lb 12.8 oz), SpO2 97 %.  PHYSICAL EXAMINATION:   Physical Exam  Constitutional: She is oriented to person, place, and time and well-developed, well-nourished, and in no distress. No distress.  HENT:  Head: Normocephalic.  Eyes: No scleral icterus.  Neck: Normal range of motion. Neck supple. No  JVD present. No tracheal deviation present.  Cardiovascular: Normal rate, regular rhythm and normal heart sounds.  Exam reveals no gallop and no friction rub.   No murmur heard. Pulmonary/Chest: Effort normal and breath sounds normal. No respiratory distress. She has no wheezes. She has no rales. She exhibits no tenderness.  Abdominal: Soft. She exhibits distension. She exhibits no mass. There is tenderness. There is no rebound and no guarding.  Hypoactive BS  Musculoskeletal: Normal range of motion. She exhibits no edema.  Neurological: She is alert and oriented to person, place, and time.  Skin: Skin is warm. No rash noted. No erythema.  Psychiatric: Affect and judgment normal.      LABORATORY PANEL:   CBC  Recent Labs Lab 01/22/16 0617  WBC 3.9  HGB 8.3*  HCT 25.6*  PLT 87*   ------------------------------------------------------------------------------------------------------------------  Chemistries   Recent Labs Lab 01/21/16 1619 01/22/16 0435  NA 136 136  K 3.0* 3.9  CL 102 108  CO2 23 23  GLUCOSE 225* 146*  BUN 13 13  CREATININE 0.70 0.49  CALCIUM 9.2 8.3*  MG 1.4*  --   AST 24  --   ALT 19  --   ALKPHOS 84  --   BILITOT 1.3*  --    ------------------------------------------------------------------------------------------------------------------  Cardiac Enzymes No results for input(s): TROPONINI in the last 168 hours. ------------------------------------------------------------------------------------------------------------------  RADIOLOGY:  Dg Abd Acute W/chest  01/21/2016  CLINICAL DATA:  Epigastric pains starting 01/19/2016 EXAM: DG ABDOMEN ACUTE W/ 1V CHEST COMPARISON:  CT scan 01/15/2016 FINDINGS: There is moderate gaseous distension of the stomach suspicious for  gastric ileus or paresis. Mild gaseous distended small bowel loops in mid abdomen. Ileus or early small bowel obstruction cannot be excluded. No evidence of free abdominal air.  Cardiomediastinal silhouette is unremarkable. There is chronic elevation of the right hemidiaphragm. No acute infiltrate or pulmonary edema. IMPRESSION: There is moderate gaseous distension of the stomach suspicious for gastric ileus or paresis. Mild gaseous distended small bowel loops in mid abdomen. Ileus or early small bowel obstruction cannot be excluded. No evidence of free abdominal air. No acute disease within chest. Electronically Signed   By: Lahoma Crocker M.D.   On: 01/21/2016 19:12     ASSESSMENT AND PLAN:   60 year old female with diabetes and multiple admissions over the past several months with diagnosis of gastroparesis and chronic abdominal pain with liver cirrhosis who presents with diarrhea and on examination has distended abdomen.  1. Abdominal distention: This is concerning for small bowel obstruction. Patient with tenderness, distended abdomen and hypoactive bowel sounds and examination. Order CT of abdomen to evaluate for SBO. Surgery consult for further evaluation and recommendations. NG tube to continuous suction for now.  2. Diarrhea: Patient had recent diagnosis of colitis. Check GI panel and C. Difficile.  3. Prolonged QT interval: Avoid medications that increase this including Celexa.  4. Hypothyroidism: Continue Synthroid. 5. Diabetes: Start low-dose insulin.  6. Essential hypertension: Continue metoprolol and Norvasc through NG tube.  7. PAF: Continue Eliquis and metoprolol for heart rate control.  8. History of nonalcoholic liver cirrhosis: Continue xifaxam.     Management plans discussed with the patient and she is in agreement.  CODE STATUS: DNR  TOTAL TIME TAKING CARE OF THIS PATIENT: 33 minutes.     POSSIBLE D/C ??, DEPENDING ON CLINICAL CONDITION.   Mehar Kirkwood M.D on 01/22/2016 at 9:33 AM  Between 7am to 6pm - Pager - [phone] After 6pm go to [email or web address] - password [email or web address] [facility]  Office   [phone]  CC: Primary care physician; Maryland Pink, MD  Note: This dictation was prepared with Dragon dictation along with smaller phrase technology. Any transcriptional errors that result from this process are unintentional.

## 2016-01-22 NOTE — Consult Note (Signed)
Patient ID: [name], female   DOB: 1956-08-10, 60 y.o.   MRN: [id number]  HPI [name] is a 60 y.o. female is to see in consultation for abdominal pain and possible colitis. Patient reports an abdominal distention and abdominal diffuse mild to moderate abdominal pain present for over 1 week. And patient was recently admitted and was found to have some colitis C. difficile was negative. Now comes back with some diarrhea and nausea and failure to thrive. Pain is diffuse and no specific alleviating or aggravating factors. A CT scan has been personal review and there is evidence of massive ascites cirrhosis but no evidence of bowel obstruction or toxic megacolon. No free air or no pneumatosis. She has other multiple significant problems including diabetes, history of stroke, anxiety and gastroparesis  HPI  Past Medical History  Diagnosis Date  . Type 1 diabetes ([place])     on levemir  . Hypothyroid   . Degenerative disk disease   . Stomach ulcer   . Diverticulitis   . Syncope 01/2015  . Anxiety   . GERD (gastroesophageal reflux disease)   . History of hiatal hernia   . Cancer (HCC)     HX OF CANCER OF UTERUS   . TIA (transient ischemic attack) 02/2015  . Hypertension   . Pancreatitis   . PAF (paroxysmal atrial fibrillation) ([place]) 03/2015    a. new onset 03/2015 in setting of intractable N/V; b. on Eliquis 5 mg bid; c. CHADSVASc 4 (DM, TIA x 2, female)  . Intussusception intestine ([place]) 05/2015  . Stroke [facility])     with minimal left sided weakness  . Allergy   . Cirrhosis of liver not due to alcohol ([place]) 2014/11/20  . Gastroparesis   . Sick sinus syndrome [facility])     Past Surgical History  Procedure Laterality Date  . Hernia repair    . Abdominal hysterectomy    . Cholecystectomy    . Esophagogastroduodenoscopy N/A 04/04/2015    Procedure: ESOPHAGOGASTRODUODENOSCOPY (EGD);  Surgeon: Hulen Luster, MD;  Location: [facility] ENDOSCOPY;  Service: Endoscopy;  Laterality: N/A;  . Cardiac  catheterization N/A 01/12/2016    Procedure: Left Heart Cath and Coronary Angiography;  Surgeon: Wellington Hampshire, MD;  Location: [place] CV LAB;  Service: Cardiovascular;  Laterality: N/A;  . Esophagogastroduodenoscopy (egd) with propofol N/A 01/18/2016    Procedure: ESOPHAGOGASTRODUODENOSCOPY (EGD) WITH PROPOFOL;  Surgeon: Lucilla Lame, MD;  Location: [facility] ENDOSCOPY;  Service: Endoscopy;  Laterality: N/A;  . Flexible sigmoidoscopy N/A 01/18/2016    Procedure: FLEXIBLE SIGMOIDOSCOPY;  Surgeon: Lucilla Lame, MD;  Location: [facility] ENDOSCOPY;  Service: Endoscopy;  Laterality: N/A;    Family History  Problem Relation Age of Onset  . Hypertension Mother   . CAD Sister   . Heart attack Sister     Deceased 20-Nov-2014  . CAD Brother     Social History Social History  Substance Use Topics  . Smoking status: Never Smoker   . Smokeless tobacco: Never Used  . Alcohol Use: No    Allergies  Allergen Reactions  . Hydrocodone Other (See Comments)    Pt states that this medication caused cirrhosis of the liver.    . Erythromycin Other (See Comments)    Reaction:  Fever   . Prednisone Other (See Comments)    Reaction:  Unknown   . Rosiglitazone Maleate Swelling  . Codeine Sulfate Rash  . Tetanus-Diphtheria Toxoids Td Rash and Other (See Comments)    Reaction:  Fever  Current Facility-Administered Medications  Medication Dose Route Frequency Provider Last Rate Last Dose  . 0.9 %  sodium chloride infusion   Intravenous Continuous Bettey Costa, MD 75 mL/hr at 01/22/16 1109    . acetaminophen (TYLENOL) tablet 650 mg  650 mg Oral Q6H PRN Lance Coon, MD       Or  . acetaminophen (TYLENOL) suppository 650 mg  650 mg Rectal Q6H PRN Lance Coon, MD      . acidophilus (RISAQUAD) capsule 2 capsule  2 capsule Oral Daily Bettey Costa, MD   2 capsule at 01/22/16 1000  . amLODipine (NORVASC) tablet 5 mg  5 mg Oral Daily Bettey Costa, MD   5 mg at 01/22/16 0751  . apixaban (ELIQUIS) tablet 5 mg  5 mg Oral  BID Lance Coon, MD   5 mg at 01/22/16 9833  . atorvastatin (LIPITOR) tablet 40 mg  40 mg Oral QHS Sital Mody, MD      . HYDROmorphone (DILAUDID) injection 0.5 mg  0.5 mg Intravenous Q4H PRN Lance Coon, MD   0.5 mg at 01/22/16 1455  . insulin aspart (novoLOG) injection 0-9 Units  0-9 Units Subcutaneous Q6H Lance Coon, MD   1 Units at 01/22/16 0434  . insulin detemir (LEVEMIR) injection 5 Units  5 Units Subcutaneous QHS Sital Mody, MD      . levothyroxine (SYNTHROID, LEVOTHROID) tablet 75 mcg  75 mcg Oral QAC breakfast Lance Coon, MD   75 mcg at 01/22/16 0752  . metoprolol tartrate (LOPRESSOR) tablet 25 mg  25 mg Oral BID Lance Coon, MD   25 mg at 01/22/16 0752  . prochlorperazine (COMPAZINE) injection 10 mg  10 mg Intravenous Q4H PRN Lance Coon, MD   10 mg at 01/22/16 1455  . rifaximin (XIFAXAN) tablet 550 mg  550 mg Oral BID Lance Coon, MD   550 mg at 01/22/16 0751  . sodium chloride flush (NS) 0.9 % injection 3 mL  3 mL Intravenous Q12H Lance Coon, MD   3 mL at 01/22/16 8250     Review of Systems A 10 point review of systems was asked and was negative except for the information on the HPI  Physical Exam Blood pressure 107/95, pulse 83, temperature 98.3 F (36.8 C), temperature source Oral, resp. rate 18, height 5' 3"  (1.6 m), weight 68.856 kg (151 lb 12.8 oz), SpO2 99 %. CONSTITUTIONAL: NAD awake alert  EYES: Pupils are equal, round, and reactive to light, Sclera are non-icteric. EARS, NOSE, MOUTH AND THROAT: The oropharynx is clear. The oral mucosa is pink and moist. Hearing is intact to voice. LYMPH NODES:  Lymph nodes in the neck are normal. RESPIRATORY:  Lungs are clear. There is normal respiratory effort, with equal breath sounds bilaterally, and without pathologic use of accessory muscles. CARDIOVASCULAR: Heart is regular without murmurs, gallops, or rubs. GI: The abdomen is  soft, nontender,  Distended w ascitis. There are no palpable masses. There is no  hepatosplenomegaly. There are normal bowel sounds in all quadrants. GU: Rectal deferred.   MUSCULOSKELETAL: Normal muscle strength and tone. No cyanosis or edema.   SKIN: Turgor is good and there are no pathologic skin lesions or ulcers. NEUROLOGIC: Motor and sensation is grossly normal. Cranial nerves are grossly intact. PSYCH:  Oriented to person, place and time. Affect is normal.  Data Reviewed I have personally reviewed the patient's imaging, laboratory findings and medical records.    Assessment   Plan Decompensated liver cirrhosis with massive ascites. Now with worsening of  her hepatic reserve evidence of increasing poor hypertension with thrombocytopenia. Recommend consult with GI/hematology, decompressive paracentesis and optimization from the liver perspective. She'll likely require further workup including hepatitis panel. I have ordered a CEA and CA-19-9 since she does have a history of uterine cancer.. I do not see any general surgical issues at this time and only see no need for any surgical intervention. We will be available if needed. Caroleen Hamman, MD FACS General Surgeon 01/22/2016, 2:56 PM

## 2016-01-22 NOTE — Progress Notes (Signed)
Dr. Benjie Karvonen paged with abd CT results and Dr. Corlis Leak note mentioning GI and hematology consult. MD acknowledged and to place orders.

## 2016-01-22 NOTE — Progress Notes (Signed)
23f 44cm NG tube inserted to left nare per order with DCarlyle Dolly, RN. Pt tolerated well, positive placement confirmed with auscultation, abd xray has been ordered and radiology on their way. Will connect to suction once pt has returned from CT so that oral contrast will not be suctioned out before test.

## 2016-01-22 NOTE — Progress Notes (Signed)
NG tube advanced 6cm to 50cm total. Radiology notified of repeat KUB order.

## 2016-01-23 ENCOUNTER — Inpatient Hospital Stay: Payer: Commercial Managed Care - HMO

## 2016-01-23 LAB — GLUCOSE, CAPILLARY
GLUCOSE-CAPILLARY: 95 mg/dL (ref 65–99)
GLUCOSE-CAPILLARY: 137 mg/dL — AB (ref 65–99)
GLUCOSE-CAPILLARY: 147 mg/dL — AB (ref 65–99)
Glucose-Capillary: 156 mg/dL — ABNORMAL HIGH (ref 65–99)

## 2016-01-23 LAB — COMPREHENSIVE METABOLIC PANEL
ALT: 17 U/L (ref 14–54)
AST: 27 U/L (ref 15–41)
Albumin: 2.7 g/dL — ABNORMAL LOW (ref 3.5–5.0)
Alkaline Phosphatase: 65 U/L (ref 38–126)
Anion gap: 3 — ABNORMAL LOW (ref 5–15)
BUN: 9 mg/dL (ref 6–20)
CALCIUM: 8.3 mg/dL — AB (ref 8.9–10.3)
CO2: 27 mmol/L (ref 22–32)
CREATININE: 0.51 mg/dL (ref 0.44–1.00)
Chloride: 110 mmol/L (ref 101–111)
Glucose, Bld: 104 mg/dL — ABNORMAL HIGH (ref 65–99)
Potassium: 3.7 mmol/L (ref 3.5–5.1)
SODIUM: 140 mmol/L (ref 135–145)
TOTAL PROTEIN: 5 g/dL — AB (ref 6.5–8.1)
Total Bilirubin: 0.9 mg/dL (ref 0.3–1.2)

## 2016-01-23 LAB — CBC
HCT: 27.6 % — ABNORMAL LOW (ref 35.0–47.0)
Hemoglobin: 8.9 g/dL — ABNORMAL LOW (ref 12.0–16.0)
MCH: 27.8 pg (ref 26.0–34.0)
MCHC: 32.1 g/dL (ref 32.0–36.0)
MCV: 86.7 fL (ref 80.0–100.0)
PLATELETS: 100 10*3/uL — AB (ref 150–440)
RBC: 3.18 MIL/uL — AB (ref 3.80–5.20)
RDW: 16.8 % — AB (ref 11.5–14.5)
WBC: 2.9 10*3/uL — AB (ref 3.6–11.0)

## 2016-01-23 LAB — BODY FLUID CELL COUNT WITH DIFFERENTIAL
EOS FL: 0 %
Lymphs, Fluid: 31 %
Monocyte-Macrophage-Serous Fluid: 63 %
Neutrophil Count, Fluid: 6 %
Total Nucleated Cell Count, Fluid: 133 cu mm

## 2016-01-23 LAB — CA 19-9 (SERIAL): CA 19 9: 23 U/mL (ref 0–35)

## 2016-01-23 LAB — PROTEIN, BODY FLUID: Total protein, fluid: <3 g/dL

## 2016-01-23 LAB — LACTATE DEHYDROGENASE, PLEURAL OR PERITONEAL FLUID: LD, Fluid: 33 U/L — ABNORMAL HIGH (ref 3–23)

## 2016-01-23 LAB — CEA: CEA: 4.4 ng/mL (ref 0.0–4.7)

## 2016-01-23 LAB — GLUCOSE, PERITONEAL FLUID: GLUCOSE, PERITONEAL FLUID: 132 mg/dL

## 2016-01-23 NOTE — Consult Note (Signed)
GI Inpatient Consult Note  Reason for Consult:   Attending Requesting Consult:  History of Present Illness: [name] is a 60 y.o. female with recurrent admissions for abdominal pain, nausea, vomiting, and diarrhea. Just discharged from hospital. N/V was better though diarrhea persisted. Dr. Allen Norris did EGD which showed minimal gastritis, flex sig showed few tics with neg biopsies for microscopic colitis, and GET showed mild delay at 4 hours. Readmitted for recurrence of symptoms. CT shows resolution of right sided colitis, cirrhosis, and ascites.  Past Medical History:  Past Medical History  Diagnosis Date  . Type 1 diabetes ([place])     on levemir  . Hypothyroid   . Degenerative disk disease   . Stomach ulcer   . Diverticulitis   . Syncope 01/2015  . Anxiety   . GERD (gastroesophageal reflux disease)   . History of hiatal hernia   . Cancer (HCC)     HX OF CANCER OF UTERUS   . TIA (transient ischemic attack) 02/2015  . Hypertension   . Pancreatitis   . PAF (paroxysmal atrial fibrillation) ([place] ) 03/2015    a. new onset 03/2015 in setting of intractable N/V; b. on Eliquis 5 mg bid; c. CHADSVASc 4 (DM, TIA x 2, female)  . Intussusception intestine ([place]) 05/2015  . Stroke [facility])     with minimal left sided weakness  . Allergy   . Cirrhosis of liver not due to alcohol ([place]) 2016  . Gastroparesis   . Sick sinus syndrome [facility])     Problem List: Patient Active Problem List   Diagnosis Date Noted  . Epigastric pain   . Abdominal pain, epigastric   . Personal history of surgery to heart and great vessels, presenting hazards to health   . Gastritis   . Foreign body in stomach   . Abnormal findings-gastrointestinal tract   . Diarrhea   . PAF (paroxysmal atrial fibrillation) ([place])   . Congestive dilated cardiomyopathy ([place])   . Generalized abdominal pain   . Nausea & vomiting   . NSTEMI (non-ST elevated myocardial infarction) ([place]) 01/11/2016  . Tachyarrhythmia 01/10/2016  .  Intractable nausea and vomiting 01/10/2016  . Tachy-brady syndrome ([place]) 12/28/2015  . Abdominal pain 12/27/2015  . Colitis 12/16/2015  . Pneumonia 11/14/2015  . Narcotic withdrawal ([place]) 11/11/2015  . Diabetes mellitus type 2, insulin dependent ([place]) 11/11/2015  . Orthostatic hypotension   . H/O TIA (transient ischemic attack) and stroke   . Left-sided weakness 10/04/2015  . Expressive aphasia 10/04/2015  . Ileus ([place]) 08/01/2015  . Ascites   . Cryptogenic cirrhosis ([place]) 07/10/2015  . Paroxysmal atrial fibrillation ([place]) 07/10/2015  . C. difficile colitis 07/10/2015  . GI (gastrointestinal bleed) 07/06/2015  . Malnutrition of moderate degree 07/04/2015  . Symptomatic bradycardia 05/27/2015  . Intussusception intestine ([place])   . Hypothyroidism due to amiodarone   . Abdominal pain, chronic, epigastric   . Chronic anemia 05/19/2015  . Thrombocytopenia ([place]) 05/19/2015  . Prolonged QT interval   . Hypomagnesemia   . Narcotic abuse   . Uncontrolled type 2 diabetes mellitus with gastroparesis ([place])   . Syncope due to orthostatic hypotension 05/18/2015  . Hypokalemia 04/06/2015  . Hyperlipidemia with target LDL less than 100 04/06/2015  . CVA (cerebral infarction) 02/15/2015  . Essential hypertension 01/12/2015  . Chronically on opiate therapy 01/12/2015  . Gastroparesis 01/12/2015  . DEPRESSION/ANXIETY 06/27/2007  . MYOFASCIAL PAIN SYNDROME 06/27/2007  . Chronic pain syndrome 03/28/2007  . GERD 03/27/2007  . DIVERTICULOSIS, COLON  03/27/2007  . LUMBAR DISC DISPLACEMENT 03/27/2007  . PROTEINURIA 03/27/2007  . UTERINE CANCER, HX OF 03/27/2007    Past Surgical History: Past Surgical History  Procedure Laterality Date  . Hernia repair    . Abdominal hysterectomy    . Cholecystectomy    . Esophagogastroduodenoscopy N/A 04/04/2015    Procedure: ESOPHAGOGASTRODUODENOSCOPY (EGD);  Surgeon: Hulen Luster, MD;  Location: [facility] ENDOSCOPY;  Service: Endoscopy;  Laterality: N/A;  .  Cardiac catheterization N/A 01/12/2016    Procedure: Left Heart Cath and Coronary Angiography;  Surgeon: Wellington Hampshire, MD;  Location: [place] CV LAB;  Service: Cardiovascular;  Laterality: N/A;  . Esophagogastroduodenoscopy (egd) with propofol N/A 01/18/2016    Procedure: ESOPHAGOGASTRODUODENOSCOPY (EGD) WITH PROPOFOL;  Surgeon: Lucilla Lame, MD;  Location: [facility] ENDOSCOPY;  Service: Endoscopy;  Laterality: N/A;  . Flexible sigmoidoscopy N/A 01/18/2016    Procedure: FLEXIBLE SIGMOIDOSCOPY;  Surgeon: Lucilla Lame, MD;  Location: [facility] ENDOSCOPY;  Service: Endoscopy;  Laterality: N/A;    Allergies: Allergies  Allergen Reactions  . Hydrocodone Other (See Comments)    Pt states that this medication caused cirrhosis of the liver.    . Erythromycin Other (See Comments)    Reaction:  Fever   . Prednisone Other (See Comments)    Reaction:  Unknown   . Rosiglitazone Maleate Swelling  . Codeine Sulfate Rash  . Tetanus-Diphtheria Toxoids Td Rash and Other (See Comments)    Reaction:  Fever     Home Medications: Prescriptions prior to admission  Medication Sig Dispense Refill Last Dose  . albuterol (PROVENTIL HFA;VENTOLIN HFA) 108 (90 Base) MCG/ACT inhaler Inhale 2 puffs into the lungs every 6 (six) hours as needed for wheezing or shortness of breath. 1 Inhaler 2 prn at prn  . amLODipine (NORVASC) 5 MG tablet Take 5 mg by mouth daily.   unknown  . apixaban (ELIQUIS) 5 MG TABS tablet Take 1 tablet (5 mg total) by mouth 2 (two) times daily. 60 tablet 0 unknown  . atorvastatin (LIPITOR) 40 MG tablet Take 40 mg by mouth at bedtime.   unknown  . budesonide-formoterol (SYMBICORT) 160-4.5 MCG/ACT inhaler Inhale 2 puffs into the lungs 2 (two) times daily. 1 Inhaler 12 unknown  . citalopram (CELEXA) 20 MG tablet Take 20 mg by mouth daily.   unknown  . dicyclomine (BENTYL) 10 MG capsule Take 10 mg by mouth 4 (four) times daily -  before meals and at bedtime.    unknown  . insulin aspart (NOVOLOG) 100  UNIT/ML injection Inject into the skin 3 (three) times daily with meals as needed for high blood sugar. Pt uses as needed per sliding scale.   unknown  . insulin detemir (LEVEMIR) 100 UNIT/ML injection Inject 17 Units into the skin at bedtime.   unknown  . levothyroxine (SYNTHROID, LEVOTHROID) 75 MCG tablet Take 1 tablet (75 mcg total) by mouth daily before breakfast. 30 tablet 1 unknown  . magnesium oxide (MAG-OX) 400 (241.3 Mg) MG tablet Take 1 tablet (400 mg total) by mouth 2 (two) times daily. 10 tablet 0 unknown  . metoCLOPramide (REGLAN) 5 MG tablet Take 1 tablet (5 mg total) by mouth 4 (four) times daily -  before meals and at bedtime. 90 tablet 0 unknown  . metoprolol tartrate (LOPRESSOR) 25 MG tablet Take 25 mg by mouth 2 (two) times daily.    unknown  . oxyCODONE (OXY IR/ROXICODONE) 5 MG immediate release tablet Take 5-10 mg by mouth every 6 (six) hours as needed for severe pain.  unknown  . pantoprazole (PROTONIX) 40 MG tablet Take 40 mg by mouth 2 (two) times daily before a meal.   unknown  . rifaximin (XIFAXAN) 550 MG TABS tablet Take 1 tablet (550 mg total) by mouth 2 (two) times daily. 60 tablet 6 unknown  . tiZANidine (ZANAFLEX) 4 MG tablet Take 4 mg by mouth 3 (three) times daily as needed for muscle spasms.    unknown  . morphine (MS CONTIN) 15 MG 12 hr tablet Take 1 tablet (15 mg total) by mouth every 8 (eight) hours. (Patient not taking: Reported on 01/21/2016) 75 tablet 0 Completed Course at Unknown time   Home medication reconciliation was completed with the patient.   Scheduled Inpatient Medications:   . acidophilus  2 capsule Oral Daily  . amLODipine  5 mg Oral Daily  . apixaban  5 mg Oral BID  . atorvastatin  40 mg Oral QHS  . insulin aspart  0-9 Units Subcutaneous Q6H  . insulin detemir  5 Units Subcutaneous QHS  . levothyroxine  75 mcg Oral QAC breakfast  . metoprolol tartrate  25 mg Oral BID  . rifaximin  550 mg Oral BID  . sodium chloride flush  3 mL Intravenous  Q12H    Continuous Inpatient Infusions:   . sodium chloride 75 mL/hr at 01/22/16 2344    PRN Inpatient Medications:  HYDROmorphone (DILAUDID) injection, prochlorperazine  Family History: family history includes CAD in her brother and sister; Heart attack in her sister; Hypertension in her mother.  The patient's family history is negative for inflammatory bowel disorders, GI malignancy, or solid organ transplantation.  Social History:   reports that she has never smoked. She has never used smokeless tobacco. She reports that she does not drink alcohol or use illicit drugs. The patient denies ETOH, tobacco, or drug use.   Review of Systems: Constitutional: Weight is stable.  Eyes: No changes in vision. ENT: No oral lesions, sore throat.  GI: see HPI.  Heme/Lymph: No easy bruising.  CV: No chest pain.  GU: No hematuria.  Integumentary: No rashes.  Neuro: No headaches.  Psych: No depression/anxiety.  Endocrine: No heat/cold intolerance.  Allergic/Immunologic: No urticaria.  Resp: No cough, SOB.  Musculoskeletal: No joint swelling.    Physical Examination: BP 142/69 mmHg  Pulse 77  Temp(Src) 98.5 F (36.9 C) (Oral)  Resp 16  Ht 5' 3"  (1.6 m)  Wt 151 lb 12.8 oz (68.856 kg)  BMI 26.90 kg/m2  SpO2 96% Gen: NAD, alert and oriented x 4 HEENT: PEERLA, EOMI, Neck: supple, no JVD or thyromegaly Chest: CTA bilaterally, no wheezes, crackles, or other adventitious sounds CV: RRR, no m/g/c/r Abd: soft, diffuse tenderness, ND, +BS in all four quadrants; no HSM, guarding, ridigity, or rebound tenderness Ext: no edema, well perfused with 2+ pulses, Skin: no rash or lesions noted Lymph: no LAD  Data: Lab Results  Component Value Date   WBC 2.9* 01/23/2016   HGB 8.9* 01/23/2016   HCT 27.6* 01/23/2016   MCV 86.7 01/23/2016   PLT 100* 01/23/2016    Recent Labs Lab 01/21/16 1619 01/22/16 0617 01/23/16 0517  HGB 11.9* 8.3* 8.9*   Lab Results  Component Value Date   NA  140 01/23/2016   K 3.7 01/23/2016   CL 110 01/23/2016   CO2 27 01/23/2016   BUN 9 01/23/2016   CREATININE 0.51 01/23/2016   Lab Results  Component Value Date   ALT 17 01/23/2016   AST 27 01/23/2016   ALKPHOS 65  01/23/2016   BILITOT 0.9 01/23/2016    Recent Labs Lab 01/22/16 1311  APTT 32  INR 1.55   Assessment/Plan: Ms. Valek is a 60 y.o. female with both UGI/LGI sxs. Stool studies are pending.  Recommendations: Would order paracentesis off eliquis to check for SBP. Continue nausea/PPI. Consider adding low dose reglan for gastroparesis. Will follow.  Thank you for the consult. Please call with questions or concerns.  Anchor Dwan, Lupita Dawn, MD

## 2016-01-23 NOTE — Progress Notes (Signed)
To Ultrasound via bed

## 2016-01-23 NOTE — Progress Notes (Signed)
Back from Ultrasound

## 2016-01-23 NOTE — Progress Notes (Addendum)
[facility] NAME: [name]    MR#:  [id number]  DATE OF BIRTH:  08/09/1956  SUBJECTIVE:   Still complaining of abdominal fullness and loose stools.  REVIEW OF SYSTEMS:    Review of Systems  Constitutional: Negative for fever, chills and malaise/fatigue.  HENT: Negative for ear discharge, ear pain, hearing loss, nosebleeds and sore throat.   Eyes: Negative for blurred vision and pain.  Respiratory: Negative for cough, hemoptysis, shortness of breath and wheezing.   Cardiovascular: Negative for chest pain, palpitations and leg swelling.  Gastrointestinal: Positive for diarrhea. Negative for nausea, vomiting, abdominal pain and blood in stool.  Genitourinary: Negative for dysuria.  Musculoskeletal: Negative for back pain.  Neurological: Negative for dizziness, tremors, speech change, focal weakness, seizures and headaches.  Endo/Heme/Allergies: Does not bruise/bleed easily.  Psychiatric/Behavioral: Negative for depression, suicidal ideas and hallucinations.    Tolerating Diet: yesClear liquid      DRUG ALLERGIES:   Allergies  Allergen Reactions  . Hydrocodone Other (See Comments)    Pt states that this medication caused cirrhosis of the liver.    . Erythromycin Other (See Comments)    Reaction:  Fever   . Prednisone Other (See Comments)    Reaction:  Unknown   . Rosiglitazone Maleate Swelling  . Codeine Sulfate Rash  . Tetanus-Diphtheria Toxoids Td Rash and Other (See Comments)    Reaction:  Fever     VITALS:  Blood pressure 111/56, pulse 84, temperature 98.5 F (36.9 C), temperature source Oral, resp. rate 18, height 5' 3"  (1.6 m), weight 68.856 kg (151 lb 12.8 oz), SpO2 94 %.  PHYSICAL EXAMINATION:   Physical Exam  Constitutional: She is oriented to person, place, and time and well-developed, well-nourished, and in no distress. No distress.  HENT:  Head: Normocephalic.  Eyes: No scleral icterus.  Neck:  Normal range of motion. Neck supple. No JVD present. No tracheal deviation present.  Cardiovascular: Normal rate, regular rhythm and normal heart sounds.  Exam reveals no gallop and no friction rub.   No murmur heard. Pulmonary/Chest: Effort normal and breath sounds normal. No respiratory distress. She has no wheezes. She has no rales. She exhibits no tenderness.  Abdominal: Soft. She exhibits distension. She exhibits no mass. There is tenderness. There is no rebound and no guarding.  Hypoactive BS  Musculoskeletal: Normal range of motion. She exhibits no edema.  Neurological: She is alert and oriented to person, place, and time.  Skin: Skin is warm. No rash noted. No erythema.  Psychiatric: Affect and judgment normal.      LABORATORY PANEL:   CBC  Recent Labs Lab 01/23/16 0517  WBC 2.9*  HGB 8.9*  HCT 27.6*  PLT 100*   ------------------------------------------------------------------------------------------------------------------  Chemistries   Recent Labs Lab 01/21/16 1619  01/23/16 0517  NA 136  < > 140  K 3.0*  < > 3.7  CL 102  < > 110  CO2 23  < > 27  GLUCOSE 225*  < > 104*  BUN 13  < > 9  CREATININE 0.70  < > 0.51  CALCIUM 9.2  < > 8.3*  MG 1.4*  --   --   AST 24  --  27  ALT 19  --  17  ALKPHOS 84  --  65  BILITOT 1.3*  --  0.9  < > = values in this interval not displayed. ------------------------------------------------------------------------------------------------------------------  Cardiac Enzymes No results for input(s): TROPONINI in  the last 168 hours. ------------------------------------------------------------------------------------------------------------------  RADIOLOGY:  Dg Abd 1 View  01/22/2016  CLINICAL DATA:  NG readjustment EXAM: ABDOMEN - 1 VIEW COMPARISON:  Prior film same day FINDINGS: There is normal small bowel gas pattern. Postcholecystectomy surgical clips are noted. NG tube with tip in mid stomach. IMPRESSION: NG tube in  place.  Normal small bowel gas pattern. Electronically Signed   By: Lahoma Crocker M.D.   On: 01/22/2016 13:53   Dg Abd 1 View  01/22/2016  CLINICAL DATA:  NG tube placement EXAM: ABDOMEN - 1 VIEW COMPARISON:  01/20/2013 FINDINGS: There is NG tube with tip in distal esophagus. Advancement of the NG tube into the stomach is recommended. Mild gaseous distended small bowel loops mid abdomen suspicious for mild ileus with slight improvement from prior exam. Some colonic gas and stool noted in transverse colon. IMPRESSION: NG tube with tip in distal esophagus. Advancement of the NG tube into the stomach is recommended. Mild gaseous distended small bowel loops in mid abdomen probable mild ileus with improvement from prior exam. Electronically Signed   By: Lahoma Crocker M.D.   On: 01/22/2016 11:41   Ct Abdomen Pelvis W Contrast  01/22/2016  CLINICAL DATA:  Diabetes, gastroparesis, generalized abdominal pain, cirrhosis, diarrhea, mild abdominal distention possible small bowel obstruction EXAM: CT ABDOMEN AND PELVIS WITH CONTRAST TECHNIQUE: Multidetector CT imaging of the abdomen and pelvis was performed using the standard protocol following bolus administration of intravenous contrast. CONTRAST:  162m ISOVUE-300 IOPAMIDOL (ISOVUE-300) INJECTION 61% COMPARISON:  01/15/2016 FINDINGS: Lower chest: The lung bases shows small left pleural effusion with left base posterior atelectasis. Hepatobiliary: Again noted nodular contour of the liver consistent with cirrhosis. There is moderate perihepatic and perisplenic ascites increased from prior exam. No focal hepatic mass. The patient is status postcholecystectomy. Pancreas: Enhanced pancreas is unremarkable. Spleen: The spleen measures 10.7 cm in length. No focal splenic mass. Perisplenic ascites. Adrenals/Urinary Tract: No adrenal gland mass is noted. Enhanced kidneys are symmetrical in size. No hydronephrosis or hydroureter. Again noted nonobstructive calculus in lower pole of the  right kidney measures 9 mm. No calcified ureteral calculi are noted. No calcified calculi are noted within urinary bladder. Delayed renal images shows bilateral renal symmetrical excretion. Bilateral visualized proximal ureter is unremarkable. Stomach/Bowel: There is no gastric outlet obstruction. No small bowel obstruction. Minimal distension of jejunal loops chronic in nature is stable from prior exam. There is no transition point in caliber of small bowel. No evidence of small bowel obstruction. Contrast material is noted in terminal ileum right colon and transverse colon. There is residual mild thickening of colonic wall in right colon just above the ileocecal valve with slight improvement from prior exam. Prior probable improving colitis. There is no evidence of extraluminal contrast material. No distal colonic obstruction. Vascular/Lymphatic: No aortic aneurysm. No retroperitoneal or mesenteric adenopathy is noted. Reproductive: The patient is status post hysterectomy. Other: There is bilateral paracolic gutter moderate ascites increased from prior exam. There is moderate pelvic ascites increased from prior exam. Descending colon and sigmoid colon is empty partially collapsed. Some colonic gas noted within rectum. Mild anasarca infiltration of subcutaneous fat abdominal and pelvic wall. Musculoskeletal: No destructive bony lesions are noted. Sagittal images of the spine shows no destructive bony lesions. IMPRESSION: 1. Again noted nodular contour of the liver and heterogeneous enhancement consistent with cirrhosis. No focal hepatic mass. Increase moderate perihepatic and perisplenic ascites. Moderate bilateral polyp coli gutter ascites. Moderate pelvic ascites increased from prior exam. 2. Again  noted right nonobstructive nephrolithiasis. No hydronephrosis or hydroureter. 3. No small bowel obstruction. No colonic obstruction. Contrast material is noted within right colon and transverse colon. 4. There is  residual mild thickening of colonic wall in right colon probable improving colitis. No evidence of perforation or extraluminal contrast material. 5. There is mild-to-moderate anasarca infiltration subcutaneous fat abdominal and pelvic wall. 6. Status post hysterectomy. Electronically Signed   By: Lahoma Crocker M.D.   On: 01/22/2016 13:52   Dg Abd Acute W/chest  01/21/2016  CLINICAL DATA:  Epigastric pains starting 01/19/2016 EXAM: DG ABDOMEN ACUTE W/ 1V CHEST COMPARISON:  CT scan 01/15/2016 FINDINGS: There is moderate gaseous distension of the stomach suspicious for gastric ileus or paresis. Mild gaseous distended small bowel loops in mid abdomen. Ileus or early small bowel obstruction cannot be excluded. No evidence of free abdominal air. Cardiomediastinal silhouette is unremarkable. There is chronic elevation of the right hemidiaphragm. No acute infiltrate or pulmonary edema. IMPRESSION: There is moderate gaseous distension of the stomach suspicious for gastric ileus or paresis. Mild gaseous distended small bowel loops in mid abdomen. Ileus or early small bowel obstruction cannot be excluded. No evidence of free abdominal air. No acute disease within chest. Electronically Signed   By: Lahoma Crocker M.D.   On: 01/21/2016 19:12     ASSESSMENT AND PLAN:   60 year old female with diabetes and multiple admissions over the past several months with diagnosis of gastroparesis and chronic abdominal pain with liver cirrhosis who presents with diarrhea and on examination has distended abdomen.  1. Abdominal distention/Ileus: This has resolved. NG tube has been discontinued. CT scan of the abdomen shows moderate amount of ascites. Patient may undergo paracentesis today depending on IR. Patient is on Eliquis.  Appreciate surgery and GI evaluation. Due to prolonged QT and terminal will not start Reglan at this time.  2. Diarrhea: Patient had recent diagnosis of colitis, as per CT scan this time colitis has  improved. C. difficile and GI panel are negative.  3. Prolonged QT interval: Avoid medications that increase this including Celexa and Reglan.. I will repeat EKG  4. Hypothyroidism: Continue Synthroid. 5. Diabetes with gastroparesis : Start low-dose insulin. Continue to monitor blood sugars. Avoid Reglan for now due to prolonged QT interval.  6. Essential hypertension: Continue metoprolol and Norvasc.  7. PAF: Continue Eliquis if IR ok with this and metoprolol for heart rate control.  8. History of nonalcoholic liver cirrhosis: Continue xifaxam.    9. Thrombocytopenia: Due to liver cirrhosis. Continue to follow.  Management plans discussed with the patient and she is in agreement.  CODE STATUS: DNR  TOTAL TIME TAKING CARE OF THIS PATIENT: 27 minutes.     POSSIBLE D/C ??, DEPENDING ON CLINICAL CONDITION.   Larken Urias M.D on 01/23/2016 at 10:35 AM  Between 7am to 6pm - Pager - [phone] After 6pm go to [email or web address] - password [email or web address] [facility]  Office  [phone]  CC: Primary care physician; [place] Pink, MD  Note: This dictation was prepared with Dragon dictation along with smaller phrase technology. Any transcriptional errors that result from this process are unintentional.

## 2016-01-23 NOTE — Plan of Care (Signed)
Problem: Nutrition: Goal: Adequate nutrition will be maintained Outcome: Not Progressing On Clear liquids  Problem: Bowel/Gastric: Goal: Will not experience complications related to bowel motility Outcome: Not Progressing For Paracentesis today

## 2016-01-23 NOTE — Care Management Note (Signed)
Case Management Note  Patient Details  Name: [name] MRN: [id number] Date of Birth: [birth year]  Subjective/Objective:   Patient presents from home with diarrhea and abdominal pain.  Followed by [facility] for SN, PT and HHA.  For paracentesis today. Gi and surgery consulting.                Action/Plan: Following progression  Expected Discharge Date:                  Expected Discharge Plan:  [place]  In-House Referral:     Discharge planning Services  CM Consult  Post Acute Care Choice:  Home Health, Resumption of Svcs/PTA Provider Choice offered to:     DME Arranged:    DME Agency:     HH Arranged:    [place] Agency:  [facility]  Status of Service:  In process, will continue to follow  Medicare Important Message Given:    Date Medicare IM Given:    Medicare IM give by:    Date Additional Medicare IM Given:    Additional Medicare Important Message give by:     If discussed at [place] of Stay Meetings, dates discussed:    Additional Comments:  Jolly Mango, RN 01/23/2016, 12:09 PM

## 2016-01-24 LAB — LIPASE, BLOOD: LIPASE: 14 U/L (ref 11–51)

## 2016-01-24 LAB — BASIC METABOLIC PANEL
ANION GAP: 4 — AB (ref 5–15)
BUN: 6 mg/dL (ref 6–20)
CALCIUM: 8.2 mg/dL — AB (ref 8.9–10.3)
CO2: 26 mmol/L (ref 22–32)
Chloride: 109 mmol/L (ref 101–111)
Creatinine, Ser: 0.48 mg/dL (ref 0.44–1.00)
Glucose, Bld: 107 mg/dL — ABNORMAL HIGH (ref 65–99)
Potassium: 3.3 mmol/L — ABNORMAL LOW (ref 3.5–5.1)
SODIUM: 139 mmol/L (ref 135–145)

## 2016-01-24 LAB — GLUCOSE, CAPILLARY
GLUCOSE-CAPILLARY: 105 mg/dL — AB (ref 65–99)
Glucose-Capillary: 195 mg/dL — ABNORMAL HIGH (ref 65–99)
Glucose-Capillary: 168 mg/dL — ABNORMAL HIGH (ref 65–99)

## 2016-01-24 LAB — MISC LABCORP TEST (SEND OUT): Labcorp test code: 19588

## 2016-01-24 LAB — MAGNESIUM: MAGNESIUM: 1.4 mg/dL — AB (ref 1.7–2.4)

## 2016-01-24 MED ORDER — MAGNESIUM SULFATE 2 GM/50ML IV SOLN
2.0000 g | Freq: Two times a day (BID) | INTRAVENOUS | Status: DC
  Administered 2016-01-24 (×2): 2 g via INTRAVENOUS
  Filled 2016-01-24 (×4): qty 50

## 2016-01-24 MED ORDER — OXYCODONE HCL 5 MG PO TABS
5.0000 mg | ORAL_TABLET | Freq: Four times a day (QID) | ORAL | Status: DC | PRN
  Administered 2016-01-24: 10 mg via ORAL
  Filled 2016-01-24: qty 2

## 2016-01-24 MED ORDER — OXYCODONE HCL 5 MG PO TABS
5.0000 mg | ORAL_TABLET | Freq: Four times a day (QID) | ORAL | Status: DC | PRN
  Administered 2016-01-25: 5 mg via ORAL
  Filled 2016-01-24: qty 2
  Filled 2016-01-24: qty 1

## 2016-01-24 MED ORDER — POTASSIUM CHLORIDE 20 MEQ/15ML (10%) PO SOLN
40.0000 meq | Freq: Once | ORAL | Status: AC
  Administered 2016-01-24: 40 meq via ORAL
  Filled 2016-01-24: qty 30

## 2016-01-24 MED ORDER — FUROSEMIDE 40 MG PO TABS
40.0000 mg | ORAL_TABLET | Freq: Once | ORAL | Status: AC
  Administered 2016-01-24: 40 mg via ORAL
  Filled 2016-01-24: qty 1

## 2016-01-24 MED ORDER — BISMUTH SUBSALICYLATE 262 MG/15ML PO SUSP
30.0000 mL | Freq: Three times a day (TID) | ORAL | Status: DC
  Administered 2016-01-24 (×3): 30 mL via ORAL
  Filled 2016-01-24: qty 118

## 2016-01-24 MED ORDER — MORPHINE SULFATE ER 15 MG PO TBCR
15.0000 mg | EXTENDED_RELEASE_TABLET | Freq: Three times a day (TID) | ORAL | Status: DC
  Administered 2016-01-24 – 2016-01-25 (×4): 15 mg via ORAL
  Filled 2016-01-24 (×5): qty 1

## 2016-01-24 NOTE — Progress Notes (Signed)
Dressing to lt abdomen dry and intact.

## 2016-01-24 NOTE — Care Management Important Message (Signed)
Important Message  Patient Details  Name: [name] MRN: [id number] Date of Birth: [birth year]   Medicare Important Message Given:  Yes    Juliann Pulse A Maalle Starrett 01/24/2016, 2:54 PM

## 2016-01-24 NOTE — Progress Notes (Signed)
[facility] [place] NAME: [name]    MR#:  [id number]  DATE OF BIRTH:  September 25, 1962  SUBJECTIVE:   Still complaining of abdominal pain however improved after paracentesis. She is still having diarrhea.  REVIEW OF SYSTEMS:    Review of Systems  Constitutional: Negative for fever, chills and malaise/fatigue.  HENT: Negative for ear discharge, ear pain, hearing loss, nosebleeds and sore throat.   Eyes: Negative for blurred vision and pain.  Respiratory: Negative for cough, hemoptysis, shortness of breath and wheezing.   Cardiovascular: Negative for chest pain, palpitations and leg swelling.  Gastrointestinal: Positive for abdominal pain and diarrhea. Negative for nausea, vomiting and blood in stool.  Genitourinary: Negative for dysuria.  Musculoskeletal: Negative for back pain.  Neurological: Negative for dizziness, tremors, speech change, focal weakness, seizures and headaches.  Endo/Heme/Allergies: Does not bruise/bleed easily.  Psychiatric/Behavioral: Negative for depression, suicidal ideas and hallucinations.    Tolerating Diet: yes Clear liquid      DRUG ALLERGIES:   Allergies  Allergen Reactions  . Hydrocodone Other (See Comments)    Pt states that this medication caused cirrhosis of the liver.    . Erythromycin Other (See Comments)    Reaction:  Fever   . Prednisone Other (See Comments)    Reaction:  Unknown   . Rosiglitazone Maleate Swelling  . Codeine Sulfate Rash  . Tetanus-Diphtheria Toxoids Td Rash and Other (See Comments)    Reaction:  Fever     VITALS:  Blood pressure 137/57, pulse 82, temperature 98.1 F (36.7 C), temperature source Oral, resp. rate 17, height 5' 3"  (1.6 m), weight 68.856 kg (151 lb 12.8 oz), SpO2 99 %.  PHYSICAL EXAMINATION:   Physical Exam  Constitutional: She is oriented to person, place, and time and well-developed, well-nourished, and in no distress. No distress.  HENT:  Head:  Normocephalic.  Eyes: No scleral icterus.  Neck: Normal range of motion. Neck supple. No JVD present. No tracheal deviation present.  Cardiovascular: Normal rate, regular rhythm and normal heart sounds.  Exam reveals no gallop and no friction rub.   No murmur heard. Pulmonary/Chest: Effort normal and breath sounds normal. No respiratory distress. She has no wheezes. She has no rales. She exhibits no tenderness.  Abdominal: Soft. Bowel sounds are normal. She exhibits no mass. There is no rebound and no guarding.  Hypoactive BS  Musculoskeletal: Normal range of motion. She exhibits no edema.  Neurological: She is alert and oriented to person, place, and time.  Skin: Skin is warm. No rash noted. No erythema.  Psychiatric: Affect and judgment normal.      LABORATORY PANEL:   CBC  Recent Labs Lab 01/23/16 0517  WBC 2.9*  HGB 8.9*  HCT 27.6*  PLT 100*   ------------------------------------------------------------------------------------------------------------------  Chemistries   Recent Labs Lab 01/23/16 0517 01/24/16 0552  NA 140 139  K 3.7 3.3*  CL 110 109  CO2 27 26  GLUCOSE 104* 107*  BUN 9 6  CREATININE 0.51 0.48  CALCIUM 8.3* 8.2*  MG  --  1.4*  AST 27  --   ALT 17  --   ALKPHOS 65  --   BILITOT 0.9  --    ------------------------------------------------------------------------------------------------------------------  Cardiac Enzymes No results for input(s): TROPONINI in the last 168 hours. ------------------------------------------------------------------------------------------------------------------  RADIOLOGY:  Dg Abd 1 View  01/22/2016  CLINICAL DATA:  NG readjustment EXAM: ABDOMEN - 1 VIEW COMPARISON:  Prior film same day FINDINGS: There is  normal small bowel gas pattern. Postcholecystectomy surgical clips are noted. NG tube with tip in mid stomach. IMPRESSION: NG tube in place.  Normal small bowel gas pattern. Electronically Signed   By: Lahoma Crocker  M.D.   On: 01/22/2016 13:53   Dg Abd 1 View  01/22/2016  CLINICAL DATA:  NG tube placement EXAM: ABDOMEN - 1 VIEW COMPARISON:  01/20/2013 FINDINGS: There is NG tube with tip in distal esophagus. Advancement of the NG tube into the stomach is recommended. Mild gaseous distended small bowel loops mid abdomen suspicious for mild ileus with slight improvement from prior exam. Some colonic gas and stool noted in transverse colon. IMPRESSION: NG tube with tip in distal esophagus. Advancement of the NG tube into the stomach is recommended. Mild gaseous distended small bowel loops in mid abdomen probable mild ileus with improvement from prior exam. Electronically Signed   By: Lahoma Crocker M.D.   On: 01/22/2016 11:41   Ct Abdomen Pelvis W Contrast  01/22/2016  CLINICAL DATA:  Diabetes, gastroparesis, generalized abdominal pain, cirrhosis, diarrhea, mild abdominal distention possible small bowel obstruction EXAM: CT ABDOMEN AND PELVIS WITH CONTRAST TECHNIQUE: Multidetector CT imaging of the abdomen and pelvis was performed using the standard protocol following bolus administration of intravenous contrast. CONTRAST:  196m ISOVUE-300 IOPAMIDOL (ISOVUE-300) INJECTION 61% COMPARISON:  01/15/2016 FINDINGS: Lower chest: The lung bases shows small left pleural effusion with left base posterior atelectasis. Hepatobiliary: Again noted nodular contour of the liver consistent with cirrhosis. There is moderate perihepatic and perisplenic ascites increased from prior exam. No focal hepatic mass. The patient is status postcholecystectomy. Pancreas: Enhanced pancreas is unremarkable. Spleen: The spleen measures 10.7 cm in length. No focal splenic mass. Perisplenic ascites. Adrenals/Urinary Tract: No adrenal gland mass is noted. Enhanced kidneys are symmetrical in size. No hydronephrosis or hydroureter. Again noted nonobstructive calculus in lower pole of the right kidney measures 9 mm. No calcified ureteral calculi are noted. No  calcified calculi are noted within urinary bladder. Delayed renal images shows bilateral renal symmetrical excretion. Bilateral visualized proximal ureter is unremarkable. Stomach/Bowel: There is no gastric outlet obstruction. No small bowel obstruction. Minimal distension of jejunal loops chronic in nature is stable from prior exam. There is no transition point in caliber of small bowel. No evidence of small bowel obstruction. Contrast material is noted in terminal ileum right colon and transverse colon. There is residual mild thickening of colonic wall in right colon just above the ileocecal valve with slight improvement from prior exam. Prior probable improving colitis. There is no evidence of extraluminal contrast material. No distal colonic obstruction. Vascular/Lymphatic: No aortic aneurysm. No retroperitoneal or mesenteric adenopathy is noted. Reproductive: The patient is status post hysterectomy. Other: There is bilateral paracolic gutter moderate ascites increased from prior exam. There is moderate pelvic ascites increased from prior exam. Descending colon and sigmoid colon is empty partially collapsed. Some colonic gas noted within rectum. Mild anasarca infiltration of subcutaneous fat abdominal and pelvic wall. Musculoskeletal: No destructive bony lesions are noted. Sagittal images of the spine shows no destructive bony lesions. IMPRESSION: 1. Again noted nodular contour of the liver and heterogeneous enhancement consistent with cirrhosis. No focal hepatic mass. Increase moderate perihepatic and perisplenic ascites. Moderate bilateral polyp coli gutter ascites. Moderate pelvic ascites increased from prior exam. 2. Again noted right nonobstructive nephrolithiasis. No hydronephrosis or hydroureter. 3. No small bowel obstruction. No colonic obstruction. Contrast material is noted within right colon and transverse colon. 4. There is residual mild thickening of colonic  wall in right colon probable improving  colitis. No evidence of perforation or extraluminal contrast material. 5. There is mild-to-moderate anasarca infiltration subcutaneous fat abdominal and pelvic wall. 6. Status post hysterectomy. Electronically Signed   By: Lahoma Crocker M.D.   On: 01/22/2016 13:52   US Paracentesis  01/23/2016  CLINICAL DATA:  Ascites EXAM: ULTRASOUND GUIDED PARACENTESIS TECHNIQUE: Survey ultrasound of the abdomen was performed and an appropriate skin entry site in the left lower abdomen was selected. Skin site was marked, prepped with Betadine, and draped in usual sterile fashion, and infiltrated locally with 1% lidocaine. A Safe-T-Centesis needle was advanced into the peritoneal space until fluid could be aspirated. The sheath was advanced and the needle removed. 3.75 L of clear yellowascites were aspirated. Samples were sent for the requested laboratory studies COMPLICATIONS: COMPLICATIONS none IMPRESSION: Technically successful ultrasound guided paracentesis, removing 3.75 L of ascites. Electronically Signed   By: Lucrezia Europe M.D.   On: 01/23/2016 10:46     ASSESSMENT AND PLAN:   60 year old female with diabetes and multiple admissions over the past several months with diagnosis of gastroparesis and chronic abdominal pain with liver cirrhosis who presents with diarrhea and on examination has distended abdomen.  1. Abdominal distention/Ileus: This has resolved. NG tube has been discontinued. CT scan of the abdomen shows moderate amount of ascites. She underwent paracentesis 5/15 with 3.75 L of ascites removed  Appreciate surgery and GI evaluation. Due to prolonged QT  will not start Reglan at this time.  2. Diarrhea: Patient had recent diagnosis of colitis, as per CT scan this time colitis is improving C. difficile and GI panel are negative. Start pepto bismuth and probiotic.  3. Prolonged QT interval: Avoid medications that increase this including Celexa and Reglan and lasix.. I checked EKG on 5/15 still with  prolonged Qt  4. Hypothyroidism: Continue Synthroid. TSh elevated but recent T4 wnl. Can have outpatient follow-up in 4-6 weeks with her PCP.  5.  Type 1 Diabetes with gastroparesis on recent gastric study:  Continue to monitor blood sugars and low dose insulin. Avoid Reglan for now due to prolonged QT interval.  6. Essential hypertension: Continue metoprolol and Norvasc.  7. PAF: Continue Eliquis and metoprolol for heart rate control.  8. History of nonalcoholic liver cirrhosis: Continue xifaxam.  She would benefit from Lasix and Aldactone however these medications can prolonged QTc interval.  9. Thrombocytopenia: Due to liver cirrhosis. Continue to follow.  10. Hypokalemia/hypomagnesemia: Repleted  Management plans discussed with the patient and she is in agreement.  CODE STATUS: DNR  TOTAL TIME TAKING CARE OF THIS PATIENT: 27 minutes.     POSSIBLE D/C tomorrow, DEPENDING ON CLINICAL CONDITION.   Shafter Jupin M.D on 01/24/2016 at 11:14 AM  Between 7am to 6pm - Pager - [phone] After 6pm go to [email or web address] - password [email or web address] [facility]  Office  [phone]  CC: Primary care physician; [place] Pink, MD  Note: This dictation was prepared with Dragon dictation along with smaller phrase technology. Any transcriptional errors that result from this process are unintentional.

## 2016-01-24 NOTE — Evaluation (Signed)
Physical Therapy Evaluation Patient Details Name: [name] MRN: [id number] DOB: April 20, 1956 Today's Date: 01/24/2016   History of Present Illness  Pt is a 60 y.o. female presenting to hospital with diarrhea, abdominal pain, and N/V.  Pt with recent discharge (and frequent admits last several months).  Pt with recent gastric emptying study and showed gastroparesis.  Pt admitted with uncontrolled type 2 DM with gastroparesis and decompensated liver cirrhosis with massive ascites.  Pt s/p paracentesis.  PMH includes h/o hiatal hernia, h/o CA, TIA, and SSS.  Clinical Impression  Prior to admission, pt was modified independent ambulating with SPC in home (home not fully accessible with RW) and RW in community.  Pt lives with family in 1 level home with ramp to enter.  Currently pt is SBA with bed mobility, CGA with transfers and ambulation with RW around nursing loop.  Pt would benefit from skilled PT to address noted impairments and functional limitations.  Recommend pt discharge to home with support of family and HHPT pending pt's progress when medically appropriate.     Follow Up Recommendations  (HHPT pending pt's progress)    Equipment Recommendations   (Pt already owns RW)    Recommendations for Other Services       Precautions / Restrictions Precautions Precautions: Fall Restrictions Weight Bearing Restrictions: No      Mobility  Bed Mobility Overal bed mobility: Needs Assistance Bed Mobility: Supine to Sit     Supine to sit: Supervision;HOB elevated        Transfers Overall transfer level: Needs assistance Equipment used: Rolling walker (2 wheeled) Transfers: Sit to/from Stand Sit to Stand: Min guard         General transfer comment: no cues for hand placement required  Ambulation/Gait Ambulation/Gait assistance: Min guard Ambulation Distance (Feet): 200 Feet Assistive device: Rolling walker (2 wheeled) Gait Pattern/deviations: Step-through pattern      General Gait Details: steady without loss of balance; good confidence with RW  Stairs            Wheelchair Mobility    Modified Rankin (Stroke Patients Only)       Balance Overall balance assessment: Needs assistance;History of Falls Sitting-balance support: Bilateral upper extremity supported;Feet supported Sitting balance-Leahy Scale: Good     Standing balance support: Bilateral upper extremity supported (on RW) Standing balance-Leahy Scale: Good                               Pertinent Vitals/Pain Pain Assessment: No/denies pain  Vitals stable throughout treatment session.    Home Living Family/patient expects to be discharged to:: Private residence Living Arrangements: Spouse/significant other;Children (2 boys) Available Help at Discharge: Family;Available 24 hours/day Type of Home: Mobile home Home Access: Ramped entrance     Home Layout: One level Home Equipment: [place] - 2 wheels;Cane - single point      Prior Function Level of Independence: Independent with assistive device(s)   Gait / Transfers Assistance Needed: Pt uses SPC in home and RW outside of home.  Pt reports 4 falls in past 6 months (LE's give out).     Comments: Pt recently discharged home with [facility] nursing and HHPT.     Hand Dominance        Extremity/Trunk Assessment   Upper Extremity Assessment: Generalized weakness           Lower Extremity Assessment: Generalized weakness;LLE deficits/detail   LLE Deficits / Details: L LE  grossly 3+/5  Cervical / Trunk Assessment: Normal  Communication   Communication: No difficulties  Cognition Arousal/Alertness: Awake/alert Behavior During Therapy: WFL for tasks assessed/performed Overall Cognitive Status: Within Functional Limits for tasks assessed                      General Comments   Nursing cleared pt for participation in physical therapy.  Pt agreeable to PT session.    Exercises Total Joint  Exercises Ankle Circles/Pumps: AROM;Strengthening;Both;10 reps;Supine Quad Sets: AROM;Strengthening;Both;10 reps;Supine Gluteal Sets: AROM;Strengthening;Both;10 reps;Supine Short Arc Quad: AROM;Strengthening;Both;10 reps;Supine Heel Slides: AROM;Strengthening;Both;10 reps;Supine Hip ABduction/ADduction: AROM;Strengthening;Both;10 reps;Supine      Assessment/Plan    PT Assessment Patient needs continued PT services  PT Diagnosis Generalized weakness   PT Problem List Decreased strength;Decreased balance;Decreased mobility  PT Treatment Interventions DME instruction;Gait training;Functional mobility training;Therapeutic activities;Therapeutic exercise;Balance training;Patient/family education   PT Goals (Current goals can be found in the Care Plan section) Acute Rehab PT Goals Patient Stated Goal: to go home PT Goal Formulation: With patient Time For Goal Achievement: 02/07/16 Potential to Achieve Goals: Good    Frequency Min 2X/week   Barriers to discharge        Co-evaluation               End of Session Equipment Utilized During Treatment: Gait belt Activity Tolerance: Patient tolerated treatment well Patient left: in chair;with call bell/phone within reach;with chair alarm set Nurse Communication: Mobility status;Precautions         Time: 0601-5615 PT Time Calculation (min) (ACUTE ONLY): 21 min   Charges:   PT Evaluation $PT Eval Low Complexity: 1 Procedure PT Treatments $Therapeutic Exercise: 8-22 mins   PT G CodesLeitha [name] 01/25/16, 4:52 PM [name], [place]

## 2016-01-24 NOTE — Progress Notes (Signed)
PHARMACY ELECTROLYTE CONSULT NOTE - INITIAL  Pharmacy Consult for Electrolytes  Indication: hypokalemia/hypomagnesia  Allergies  Allergen Reactions  . Hydrocodone Other (See Comments)    Pt states that this medication caused cirrhosis of the liver.    . Erythromycin Other (See Comments)    Reaction:  Fever   . Prednisone Other (See Comments)    Reaction:  Unknown   . Rosiglitazone Maleate Swelling  . Codeine Sulfate Rash  . Tetanus-Diphtheria Toxoids Td Rash and Other (See Comments)    Reaction:  Fever     Patient Measurements: Height: 5' 3"  (160 cm) Weight: 151 lb 12.8 oz (68.856 kg) IBW/kg (Calculated) : 52.4 Adjusted Body Weight:   Vital Signs: Temp: 100 F (37.8 C) (05/16 1122) Temp Source: Oral (05/16 1122) BP: 106/54 mmHg (05/16 1122) Pulse Rate: 83 (05/16 1122) Intake/Output from previous day: 05/15 0701 - 05/16 0700 In: 2430 [P.O.:2280; I.V.:150] Out: 1200 [Urine:1200] Intake/Output from this shift: Total I/O In: 240 [P.O.:240] Out: 800 [Urine:800]  Labs:  Recent Labs  01/21/16 1619 01/22/16 0617 01/22/16 1311 01/23/16 0517  WBC 7.6 3.9  --  2.9*  HGB 11.9* 8.3*  --  8.9*  HCT 36.8 25.6*  --  27.6*  PLT 150 87*  --  100*  APTT  --   --  32  --   INR  --   --  1.55  --      Recent Labs  01/21/16 1619 01/22/16 0435 01/23/16 0517 01/24/16 0552  NA 136 136 140 139  K 3.0* 3.9 3.7 3.3*  CL 102 108 110 109  CO2 23 23 27 26   GLUCOSE 225* 146* 104* 107*  BUN 13 13 9 6   CREATININE 0.70 0.49 0.51 0.48  CALCIUM 9.2 8.3* 8.3* 8.2*  MG 1.4*  --   --  1.4*  PROT 7.2  --  5.0*  --   ALBUMIN 3.9  --  2.7*  --   AST 24  --  27  --   ALT 19  --  17  --   ALKPHOS 84  --  65  --   BILITOT 1.3*  --  0.9  --    Estimated Creatinine Clearance: 67.9 mL/min (by C-G formula based on Cr of 0.48).    Recent Labs  01/23/16 2214 01/24/16 0418 01/24/16 1033  GLUCAP 156* 105* 195*    Medical History: Past Medical History  Diagnosis Date  . Type 1  diabetes ([place])     on levemir  . Hypothyroid   . Degenerative disk disease   . Stomach ulcer   . Diverticulitis   . Syncope 01/2015  . Anxiety   . GERD (gastroesophageal reflux disease)   . History of hiatal hernia   . Cancer (H[facility])     HX OF CANCER OF UTERUS   . TIA (transient ischemic attack) 02/2015  . Hypertension   . Pancreatitis   . PAF (paroxysmal atrial fibrillation) ([place]) 03/2015    a. new onset 03/2015 in setting of intractable N/V; b. on Eliquis 5 mg bid; c. CHADSVASc 4 (DM, TIA x 2, female)  . Intussusception intestine ([place]) 05/2015  . Stroke [facility])     with minimal left sided weakness  . Allergy   . Cirrhosis of liver not due to alcohol ([place]) 2016  . Gastroparesis   . Sick sinus syndrome (HCC)     Medications:  Scheduled:  . acidophilus  2 capsule Oral Daily  . amLODipine  5 mg Oral  Daily  . apixaban  5 mg Oral BID  . atorvastatin  40 mg Oral QHS  . bismuth subsalicylate  30 mL Oral TID AC & HS  . insulin aspart  0-9 Units Subcutaneous Q6H  . insulin detemir  5 Units Subcutaneous QHS  . levothyroxine  75 mcg Oral QAC breakfast  . magnesium sulfate 1 - 4 g bolus IVPB  2 g Intravenous BID  . metoprolol tartrate  25 mg Oral BID  . morphine  15 mg Oral Q8H  . rifaximin  550 mg Oral BID  . sodium chloride flush  3 mL Intravenous Q12H   Infusions:    Assessment: 60 yo female with Diarrhea. Hx liver cirrhosis, DM, gastroparesis.  Plan:  K=3.3, Mag=1.4.  MD order for Magnesium sulfate 2 gram IV BID and KCL 70mq x 1.  Follow up K+/Mag with am labs.  Note- Mag.Sulfate IV is currently Scheduled BID  Dauntae Derusha A 01/24/2016,1:01 PM

## 2016-01-24 NOTE — Plan of Care (Signed)
Problem: Nutrition: Goal: Adequate nutrition will be maintained Outcome: Progressing Advanced to soft diet today, tolerating well

## 2016-01-25 ENCOUNTER — Inpatient Hospital Stay: Payer: Commercial Managed Care - HMO

## 2016-01-25 ENCOUNTER — Other Ambulatory Visit: Payer: Self-pay

## 2016-01-25 LAB — BASIC METABOLIC PANEL
Anion gap: 3 — ABNORMAL LOW (ref 5–15)
BUN: 7 mg/dL (ref 6–20)
CHLORIDE: 107 mmol/L (ref 101–111)
CO2: 27 mmol/L (ref 22–32)
Calcium: 8 mg/dL — ABNORMAL LOW (ref 8.9–10.3)
Creatinine, Ser: 0.64 mg/dL (ref 0.44–1.00)
GFR calc Af Amer: >60 mL/min (ref >60)
GFR calc non Af Amer: >60 mL/min (ref >60)
GLUCOSE: 155 mg/dL — AB (ref 65–99)
POTASSIUM: 3.5 mmol/L (ref 3.5–5.1)
Sodium: 137 mmol/L (ref 135–145)

## 2016-01-25 LAB — GLUCOSE, CAPILLARY
GLUCOSE-CAPILLARY: 207 mg/dL — AB (ref 65–99)
Glucose-Capillary: 156 mg/dL — ABNORMAL HIGH (ref 65–99)
Glucose-Capillary: 195 mg/dL — ABNORMAL HIGH (ref 65–99)

## 2016-01-25 LAB — CBC
HEMATOCRIT: 26.8 % — AB (ref 35.0–47.0)
HEMOGLOBIN: 8.6 g/dL — AB (ref 12.0–16.0)
MCH: 27.2 pg (ref 26.0–34.0)
MCHC: 32 g/dL (ref 32.0–36.0)
MCV: 84.9 fL (ref 80.0–100.0)
Platelets: 78 10*3/uL — ABNORMAL LOW (ref 150–440)
RBC: 3.15 MIL/uL — ABNORMAL LOW (ref 3.80–5.20)
RDW: 17.1 % — ABNORMAL HIGH (ref 11.5–14.5)
WBC: 4.3 10*3/uL (ref 3.6–11.0)

## 2016-01-25 LAB — MAGNESIUM: Magnesium: 1.7 mg/dL (ref 1.7–2.4)

## 2016-01-25 MED ORDER — BISMUTH SUBSALICYLATE 262 MG/15ML PO SUSP: 30.0000 mL | ORAL | Status: DC | PRN

## 2016-01-25 MED ORDER — POTASSIUM CHLORIDE CRYS ER 20 MEQ PO TBCR
20.0000 meq | EXTENDED_RELEASE_TABLET | Freq: Once | ORAL | Status: AC
  Administered 2016-01-25: 20 meq via ORAL
  Filled 2016-01-25: qty 1

## 2016-01-25 MED ORDER — POTASSIUM CHLORIDE 20 MEQ PO PACK
20.0000 meq | PACK | Freq: Once | ORAL | Status: DC
  Filled 2016-01-25: qty 1

## 2016-01-25 MED ORDER — INSULIN DETEMIR 100 UNIT/ML ~~LOC~~ SOLN: 10.0000 [IU] | Freq: Every day | SUBCUTANEOUS | Status: DC

## 2016-01-25 MED ORDER — RISAQUAD PO CAPS: 2.0000 | ORAL_CAPSULE | Freq: Every day | ORAL | Status: DC

## 2016-01-25 MED ORDER — MAGNESIUM SULFATE 2 GM/50ML IV SOLN
2.0000 g | Freq: Two times a day (BID) | INTRAVENOUS | Status: DC
  Administered 2016-01-25: 2 g via INTRAVENOUS
  Filled 2016-01-25 (×2): qty 50

## 2016-01-25 NOTE — Progress Notes (Addendum)
PHARMACY ELECTROLYTE CONSULT NOTE - follow up  Pharmacy Consult for Electrolytes  Indication: hypokalemia/hypomagnesia  Allergies  Allergen Reactions  . Hydrocodone Other (See Comments)    Pt states that this medication caused cirrhosis of the liver.    . Erythromycin Other (See Comments)    Reaction:  Fever   . Prednisone Other (See Comments)    Reaction:  Unknown   . Rosiglitazone Maleate Swelling  . Codeine Sulfate Rash  . Tetanus-Diphtheria Toxoids Td Rash and Other (See Comments)    Reaction:  Fever     Patient Measurements: Height: 5' 3"  (160 cm) Weight: 151 lb 12.8 oz (68.856 kg) IBW/kg (Calculated) : 52.4    Vital Signs: Temp: 98.3 F (36.8 C) (05/17 0428) Temp Source: Oral (05/17 0428) BP: 111/49 mmHg (05/17 0647) Pulse Rate: 70 (05/17 0647) Intake/Output from previous day: 05/16 0701 - 05/17 0700 In: 770 [P.O.:720; IV Piggyback:50] Out: 2400 [Urine:2400] Intake/Output from this shift:    Labs:  Recent Labs  01/22/16 1311 01/23/16 0517 01/25/16 0547  WBC  --  2.9* 4.3  HGB  --  8.9* 8.6*  HCT  --  27.6* 26.8*  PLT  --  100* 78*  APTT 32  --   --   INR 1.55  --   --      Recent Labs  01/23/16 0517 01/24/16 0552 01/25/16 0547  NA 140 139 137  K 3.7 3.3* 3.5  CL 110 109 107  CO2 27 26 27   GLUCOSE 104* 107* 155*  BUN 9 6 7   CREATININE 0.51 0.48 0.64  CALCIUM 8.3* 8.2* 8.0*  MG  --  1.4* 1.7  PROT 5.0*  --   --   ALBUMIN 2.7*  --   --   AST 27  --   --   ALT 17  --   --   ALKPHOS 65  --   --   BILITOT 0.9  --   --    Estimated Creatinine Clearance: 67.9 mL/min (by C-G formula based on Cr of 0.64).    Recent Labs  01/24/16 1658 01/24/16 2147 01/25/16 0354  GLUCAP 168* 207* 156*    Assessment: 60 yo female with Diarrhea. Hx liver cirrhosis, DM, gastroparesis. 5/16: K=3.3, Mag=1.4.  MD order for Magnesium sulfate 2 gram IV BID and KCL 78mq x 1.   Plan:  K 3.5, Mag 1.7 - Will order KCl 20 mEq PO x1 and leave current order  for mag sulfate 2g IV BID. Pt with Hx of AFib will give a little KCl to ensure K stays >3.5.  Follow up K+/Mag with am labs.  Note: Mag sulfate currently ordered BID.  Pharmacy will continue to follow.   WRayna SextonL 01/25/2016,8:31 AM

## 2016-01-25 NOTE — Progress Notes (Signed)
BP running low, informed patient that her pain medicine are on hold for now. Will reassess BP. Patient is currently sleep at the moment will continue to monitor. Nurse notice that right forearm has some swelling and tenderness at touch. IV was removed on 01/24/16.  Possible phlebitis at removal site. Ice pack applied. Dr. Wynelle Cleveland awaiting call back will continue to monitor.

## 2016-01-25 NOTE — Care Management (Signed)
Discussed discharge with patient. Patient denies issues obtaining medications, copays or transportation. PCP is Dr. Kary Kos. She is agreeable to [facility] resumption of care for SN, PT and HHA. [facility] notified of discharge.

## 2016-01-25 NOTE — Progress Notes (Signed)
Communicated with Dr.pyreddy about patients swollen right FA.  Dr. Estanislado Pandy stated to apply ice to area and elevate arm, if swelling has not minimize in a couple of hours continue with proper testing to r/o clot

## 2016-01-25 NOTE — Progress Notes (Addendum)
Per Dr. Benjie Karvonen okay to discharge patient home and discontinue [name] for the the arm. Patient educated on applying ice to help with IV infiltration. Patient received discharge instructions, pt verbalized understanding. IV was removed with no signs of infection. Dressing clean, dry intact. No skin tears or wounds present. Patient was escorted out with staff member via wheelchair via private auto. No further needs from care management team.

## 2016-01-25 NOTE — Consult Note (Signed)
   [facility] CM Inpatient Consult   01/25/2016  [name] 28-Oct-1955 [id number]   Called into patient's room. Spoke with patient about the community nurse that would be following up with her at discharge. Patient informed this hospital liaison that her phone was currently off related to a late payment but the [facility] community nurse could reach out to her son [name] to follow up with her post discharge. The patient stated she could also call the nurse form her neighbors. Patient was unsure of his number but stated she thought it was in the chart. [name] was unable to write down community nurse's number [name] [phone]) [phone])  so liaison requested [name] the unit secretary write the number down and give it to patient. [name] was also going to let inpatient care manager know patient's home phone not in service at the moment to make arrangements with home health related to liaison being unable to reach her at this time. Will make [facility] community nurse know of conversation. [facility] Care management services do not interfere or replace any services arranged by the inpatient care management team. For concerns or questions please call:  Rita Feazell RN, [place] [facility] Liaison  [phone]) Business Mobile [phone]) Toll free office

## 2016-01-25 NOTE — Discharge Summary (Signed)
[facility] [place] NAME: [name]    MR#:  [id number]  DATE OF BIRTH:  Jul 10, 1956  DATE OF ADMISSION:  01/21/2016 ADMITTING PHYSICIAN: Lance Coon, MD  DATE OF DISCHARGE: *01/25/2016  PRIMARY CARE PHYSICIAN: Maryland Pink, MD    ADMISSION DIAGNOSIS:  Epigastric pain [R10.13]  DISCHARGE DIAGNOSIS:  Principal Problem:   Uncontrolled type 2 diabetes mellitus with gastroparesis (HCC) Active Problems:   DEPRESSION/ANXIETY   Essential hypertension   Hypokalemia   Prolonged QT interval   Paroxysmal atrial fibrillation (HCC)   Epigastric pain   SECONDARY DIAGNOSIS:   Past Medical History  Diagnosis Date  . Type 1 diabetes ([place])     on levemir  . Hypothyroid   . Degenerative disk disease   . Stomach ulcer   . Diverticulitis   . Syncope 01/2015  . Anxiety   . GERD (gastroesophageal reflux disease)   . History of hiatal hernia   . Cancer (HCC)     HX OF CANCER OF UTERUS   . TIA (transient ischemic attack) 02/2015  . Hypertension   . Pancreatitis   . PAF (paroxysmal atrial fibrillation) ([place]) 03/2015    a. new onset 03/2015 in setting of intractable N/V; b. on Eliquis 5 mg bid; c. CHADSVASc 4 (DM, TIA x 2, female)  . Intussusception intestine ([place]) 05/2015  . Stroke [facility])     with minimal left sided weakness  . Allergy   . Cirrhosis of liver not due to alcohol ([place]) 2016  . Gastroparesis   . Sick sinus syndrome [facility])     HOSPITAL COURSE:   60 year old female with diabetes and multiple admissions over the past several months with diagnosis of gastroparesis and chronic abdominal pain with liver cirrhosis who presents with diarrhea and on examination has distended abdomen.  1. Abdominal distention/Ileus: This has resolved. She Initially had NG tube placed and subsequently was discontinued when the CT scan showed resolved ileus.  CT scan of the abdomen shows moderate amount of ascites. She underwent paracentesis 5/15 with  3.75 L of ascites removed   Due to prolonged QT I have discontinued Reglan. 2. Diarrhea: Patient had recent diagnosis of colitis, as per CT scan this time colitis is improving C. difficile and GI panel are negative. Diarrhea has resolved. Anemia and tablets have been discontinued. She will continue with probiotic Pepto-Bismol when necessary..  3. Prolonged QT interval: Avoid medications that increase this including Celexa and Reglan and lasix.. I checked EKG on 5/15 still with prolonged Qt  4. Hypothyroidism: Continue Synthroid. TSh elevated but recent T4 wnl. She will need outpatient follow-up in 4-6 weeks with her PCP.  5. Type 1 Diabetes with gastroparesis on recent gastric study:  Avoid Reglan for now due to prolonged QT interval. Insulin dose was decreased due to her blood sugars while in the hospital. She will need close follow-up with her PCP.  6. Essential hypertension: Continue metoprolol and Norvasc.  7. PAF: Continue Eliquis and metoprolol for heart rate control.  8. History of nonalcoholic liver cirrhosis: Continue xifaxam.  She would benefit from Lasix and Aldactone however these medications can prolonged QTc interval.  9. Thrombocytopenia: Due to liver cirrhosis. Continue to follow. 10. Hypokalemia/hypomagnesemia: Repleted 11. Phlebitis right arm: This is due to infiltrated IV. She will continue compress.  DISCHARGE CONDITIONS AND DIET:   Stable on low fat sodium and water restriction diet  CONSULTS OBTAINED:  Treatment Team:  Hulen Luster, MD  DRUG ALLERGIES:  Allergies  Allergen Reactions  . Hydrocodone Other (See Comments)    Pt states that this medication caused cirrhosis of the liver.    . Erythromycin Other (See Comments)    Reaction:  Fever   . Prednisone Other (See Comments)    Reaction:  Unknown   . Rosiglitazone Maleate Swelling  . Codeine Sulfate Rash  . Tetanus-Diphtheria Toxoids Td Rash and Other (See Comments)    Reaction:  Fever      DISCHARGE MEDICATIONS:   Current Discharge Medication List    START taking these medications   Details  acidophilus (RISAQUAD) CAPS capsule Take 2 capsules by mouth daily. Qty: 60 capsule, Refills: 0    bismuth subsalicylate (PEPTO BISMOL) 262 MG/15ML suspension Take 30 mLs by mouth every 4 (four) hours as needed for diarrhea or loose stools. Qty: 360 mL, Refills: 0      CONTINUE these medications which have CHANGED   Details  insulin detemir (LEVEMIR) 100 UNIT/ML injection Inject 0.1 mLs (10 Units total) into the skin at bedtime. Qty: 10 mL, Refills: 11      CONTINUE these medications which have NOT CHANGED   Details  albuterol (PROVENTIL HFA;VENTOLIN HFA) 108 (90 Base) MCG/ACT inhaler Inhale 2 puffs into the lungs every 6 (six) hours as needed for wheezing or shortness of breath. Qty: 1 Inhaler, Refills: 2    amLODipine (NORVASC) 5 MG tablet Take 5 mg by mouth daily.    apixaban (ELIQUIS) 5 MG TABS tablet Take 1 tablet (5 mg total) by mouth 2 (two) times daily. Qty: 60 tablet, Refills: 0    atorvastatin (LIPITOR) 40 MG tablet Take 40 mg by mouth at bedtime.    budesonide-formoterol (SYMBICORT) 160-4.5 MCG/ACT inhaler Inhale 2 puffs into the lungs 2 (two) times daily. Qty: 1 Inhaler, Refills: 12    dicyclomine (BENTYL) 10 MG capsule Take 10 mg by mouth 4 (four) times daily -  before meals and at bedtime.     insulin aspart (NOVOLOG) 100 UNIT/ML injection Inject into the skin 3 (three) times daily with meals as needed for high blood sugar. Pt uses as needed per sliding scale.    levothyroxine (SYNTHROID, LEVOTHROID) 75 MCG tablet Take 1 tablet (75 mcg total) by mouth daily before breakfast. Qty: 30 tablet, Refills: 1    metoprolol tartrate (LOPRESSOR) 25 MG tablet Take 25 mg by mouth 2 (two) times daily.     oxyCODONE (OXY IR/ROXICODONE) 5 MG immediate release tablet Take 5-10 mg by mouth every 6 (six) hours as needed for severe pain.    pantoprazole (PROTONIX) 40  MG tablet Take 40 mg by mouth 2 (two) times daily before a meal.    rifaximin (XIFAXAN) 550 MG TABS tablet Take 1 tablet (550 mg total) by mouth 2 (two) times daily. Qty: 60 tablet, Refills: 6    tiZANidine (ZANAFLEX) 4 MG tablet Take 4 mg by mouth 3 (three) times daily as needed for muscle spasms.     morphine (MS CONTIN) 15 MG 12 hr tablet Take 1 tablet (15 mg total) by mouth every 8 (eight) hours. Qty: 75 tablet, Refills: 0      STOP taking these medications     citalopram (CELEXA) 20 MG tablet      magnesium oxide (MAG-OX) 400 (241.3 Mg) MG tablet      metoCLOPramide (REGLAN) 5 MG tablet               Today   CHIEF COMPLAINT:    No acute issues.  Her right arm is painful due to IV infiltration. Diarrhea has resolved. No abdominal pain this morning.   VITAL SIGNS:  Blood pressure 106/60, pulse 84, temperature 98.3 F (36.8 C), temperature source Oral, resp. rate 18, height 5' 3"  (1.6 m), weight 68.856 kg (151 lb 12.8 oz), SpO2 93 %.   REVIEW OF SYSTEMS:  ROS   PHYSICAL EXAMINATION:  GENERAL:  60 y.o.-year-old patient lying in the bed with no acute distress.  NECK:  Supple, no jugular venous distention. No thyroid enlargement, no tenderness.  LUNGS: Normal breath sounds bilaterally, no wheezing, rales,rhonchi  No use of accessory muscles of respiration.  CARDIOVASCULAR: S1, S2 normal. No murmurs, rubs, or gallops.  ABDOMEN: Soft, non-tender, non-distended. Bowel sounds present. No organomegaly or mass.  EXTREMITIES: No pedal edema, cyanosis, or clubbing.  PSYCHIATRIC: The patient is alert and oriented x 3.  SKIN: No obvious rash, lesion, or ulcer. Right arm some swelling  DATA REVIEW:   CBC  Recent Labs Lab 01/25/16 0547  WBC 4.3  HGB 8.6*  HCT 26.8*  PLT 78*    Chemistries   Recent Labs Lab 01/23/16 0517  01/25/16 0547  NA 140  < > 137  K 3.7  < > 3.5  CL 110  < > 107  CO2 27  < > 27  GLUCOSE 104*  < > 155*  BUN 9  < > 7  CREATININE  0.51  < > 0.64  CALCIUM 8.3*  < > 8.0*  MG  --   < > 1.7  AST 27  --   --   ALT 17  --   --   ALKPHOS 65  --   --   BILITOT 0.9  --   --   < > = values in this interval not displayed.  Cardiac Enzymes No results for input(s): TROPONINI in the last 168 hours.  Microbiology Results  @MICRORSLT48 @  RADIOLOGY:  No results found.    Management plans discussed with the patient and she is in agreement. Stable for discharge home with [facility]  Patient should follow up with PCP in 1 weel  CODE STATUS:     Code Status Orders        Start     Ordered   01/21/16 2225  Do not attempt resuscitation (DNR)   Continuous    Question Answer Comment  In the event of cardiac or respiratory ARREST Do not call a "code blue"   In the event of cardiac or respiratory ARREST Do not perform Intubation, CPR, defibrillation or ACLS   In the event of cardiac or respiratory ARREST Use medication by any route, position, wound care, and other measures to relive pain and suffering. May use oxygen, suction and manual treatment of airway obstruction as needed for comfort.      01/21/16 2224    Code Status History    Date Active Date Inactive Code Status Order ID Comments User Context   01/15/2016  1:39 PM 01/19/2016  3:29 PM DNR [id number]  Gladstone Lighter, MD ED   01/10/2016 10:00 PM 01/13/2016  5:24 PM DNR [id number]  Harrie Foreman, MD Inpatient   12/27/2015 11:02 PM 01/01/2016  6:17 PM DNR [id number]  Max Sane, MD Inpatient   12/16/2015  4:54 PM 12/19/2015  4:52 PM DNR [id number]  Loletha Grayer, MD ED   11/11/2015 11:36 AM 11/16/2015  2:05 PM Full Code [id number]  Samella Parr, NP Inpatient   10/15/2015  1:23 AM 10/16/2015  6:17  PM Full Code [id number]  Lance Coon, MD Inpatient   10/04/2015  8:19 PM 10/08/2015  6:34 PM Full Code [id number]  Bethena Roys, MD Inpatient   08/15/2015  1:05 AM 08/16/2015  8:04 PM Full Code [id number]  Harrie Foreman, MD Inpatient   08/01/2015  1:25 AM 08/06/2015  5:41 PM Full Code  [id number]  Harrie Foreman, MD Inpatient   07/04/2015  2:53 AM 07/11/2015  6:33 PM Full Code [id number]  Lytle Butte, MD ED   05/27/2015 10:48 PM 05/31/2015  2:37 PM Full Code [id number]  Rise Patience, MD Inpatient   05/19/2015  1:14 AM 05/22/2015  4:28 PM Full Code [id number]  Rise Patience, MD Inpatient   04/05/2015  9:16 AM 04/07/2015  5:23 PM Full Code [id number]  Bettey Costa, MD Inpatient   04/03/2015 12:31 PM 04/05/2015  9:16 AM Full Code [id number]  Bettey Costa, MD Inpatient   03/11/2015  4:49 PM 03/14/2015  3:50 PM Full Code [id number]  Dustin Flock, MD Inpatient   02/15/2015  4:51 PM 02/17/2015  2:33 PM DNR [id number]  Dustin Flock, MD Inpatient   02/15/2015  4:35 PM 02/15/2015  4:51 PM Full Code [id number]  Dustin Flock, MD Inpatient   01/11/2015  2:40 AM 01/12/2015  6:11 PM Full Code [id number]  Etta Quill, DO ED      TOTAL TIME TAKING CARE OF THIS PATIENT: 37 minutes.    Note: This dictation was prepared with Dragon dictation along with smaller phrase technology. Any transcriptional errors that result from this process are unintentional.  Kortland Nichols M.D on 01/25/2016 at 11:23 AM  Between 7am to 6pm - Pager - [phone] After 6pm go to [email or web address] - password [email or web address] [place] [facility]  Office  [phone]  CC: Primary care physician; Maryland Pink, MD

## 2016-01-26 ENCOUNTER — Inpatient Hospital Stay: Payer: Self-pay | Admitting: Gastroenterology

## 2016-01-26 ENCOUNTER — Other Ambulatory Visit: Payer: Self-pay | Admitting: *Deleted

## 2016-01-26 ENCOUNTER — Telehealth: Payer: Self-pay | Admitting: Gastroenterology

## 2016-01-26 NOTE — Telephone Encounter (Signed)
Called patients son to get telephone number for patient. It's been busy all day and he stated it was turned off. I asked him to have [name] call our office to schedule a follow up/hospital appointment.

## 2016-01-26 NOTE — Patient Outreach (Signed)
[place] [facility]) Care Management [place] Telephone Outreach, Transition of Care, Attempt 1 01/26/2016  CARRELL PALMATIER 1956-09-08 [id number]  Unsuccessful telephone outreach to Ms. Frances Ambrosino, 60 y/o female referred to [place] for multiple ED/ Hospital admissions; patient was most recently admitted May 13-17, 2017, for ongoing epigastric pain.  I made (3) total attempts today to contact patient:  1) Patient number [phone]: automated outgoing VM msg stated, "the person you are trying to reach is not available, please try your call later," and would not allow me to leave a VM msg.  2) Son Martinique Prejean (listed on [facility] consent as patient contact) at [phone]: noted this was the same number listed for patient; again, automated outgoing VM msg stated, "the person you are trying to reach is not available, please try your call later," and would not allow me to leave a VM msg.  3) Mother Lennox Solders (listed on [facility] consent as patient contact) at [phone]: left HIPAA complian VM msg, asking Ms. Russell to relay my message to [name], as patient requested when she spoke to [facility] liaison, Floyd Minor, while she was hospitalized most recently.  Plan: Will re-attempt telephone outreach next week for transition of care after recent hospitalization if I do not hear back from the patient tomorrow.  [name], RN, BSN, [employer] [facility] Care Management  [phone]

## 2016-01-27 ENCOUNTER — Other Ambulatory Visit: Payer: Self-pay | Admitting: *Deleted

## 2016-01-27 LAB — CULTURE, BODY FLUID W GRAM STAIN -BOTTLE: Culture: NO GROWTH

## 2016-01-27 NOTE — Patient Outreach (Signed)
[place] [facility]) Care Management [place] Care Coordination Telephone Outreach 01/27/2016  [name] 11-16-1955 [id number]  Successful telephone outreach to [name], Practice Manager for [facility] [place]/ Dr. Kary Kos.  Explained multiple [facility] CM attempts to reach/ engage [name],  60 y/o female referred to [facility] for multiple ED/ Hospital admissions; patient was most recently admitted May 13-17, 2017, for ongoing epigastric pain.  [name] confirmed that [name] has been non-compliant with her office visits to their practice, and stated that [name]'s last OV was 08/31/15; [name] stated that patient has cancelled or been "no-show" for every other appointment that has been scheduled since December 2016.  [name] confirmed that the 08/31/15 OV with Dr. Kary Kos was an initial (new) patient visit.  [name] confirmed that [name]'s next OV was scheduled for May 24 at 11:00 am, and stated that she would reach out to Dr. Kary Kos to request that [place] RN CM join that scheduled visit.  Plan: [name] will confirm with [place] CM RN CM joint office visit scheduled for Feb 01, 2016 at 11:00 am.  Rita Rack, RN, BSN, [place] Care Management  [phone]

## 2016-01-27 NOTE — Patient Outreach (Addendum)
[place] [facility]) Care Management    [place] Telephone Outreach, Transition of Care, Attempt 2 01/27/2016  TIMESHA CERVANTEZ [birth year] [id number]  Successful telephone outreach for Ms. Chanice Brenton, 60 y/o female referred to [place] for multiple ED/ Hospital admissions; patient was most recently admitted May 13-17, 2017, for ongoing epigastric pain.  I received a return VM from yesterday's call attempt/ VM to Ms. Lamia's Mother Lennox Solders (listed on [facility] consent as patient contact) at [phone], so I returned Ms. Russell's call, and explained that Ms. Baham had asked [facility] liaison RN CM contact Ms. Joneen Caraway to relay our messages to [place], and ask [place] to return our phone calls. Ms. Joneen Caraway stated "we have a hard time staying contacting her too."  Brief HIPAA compliant conversation with Ms. Joneen Caraway, who stated that she "would do her best" to make sure that Ms. Hilaire gets today's message.  Ms. Joneen Caraway stated that she "doesn't think she is out of the hospital yet.... If she is, no one has told us."  I made sure that Ms. Joneen Caraway had my direct contact information and work day hours during our conversation so that she could share this information with the patient, as Ms. [facility] requested.   [name], RN, BSN, [employer] [facility] Care Management  [phone]

## 2016-01-28 ENCOUNTER — Emergency Department
Admission: EM | Admit: 2016-01-28 | Discharge: 2016-01-29 | Disposition: A | Discharge status: Home / Self Care | Payer: Commercial Managed Care - HMO | Attending: Emergency Medicine | Admitting: Emergency Medicine

## 2016-01-28 ENCOUNTER — Emergency Department: Payer: Commercial Managed Care - HMO

## 2016-01-28 DIAGNOSIS — I252 Old myocardial infarction: Secondary | ICD-10-CM | POA: Insufficient documentation

## 2016-01-28 DIAGNOSIS — I480 Paroxysmal atrial fibrillation: Secondary | ICD-10-CM | POA: Insufficient documentation

## 2016-01-28 DIAGNOSIS — Z8673 Personal history of transient ischemic attack (TIA), and cerebral infarction without residual deficits: Secondary | ICD-10-CM | POA: Diagnosis not present

## 2016-01-28 DIAGNOSIS — E1143 Type 2 diabetes mellitus with diabetic autonomic (poly)neuropathy: Secondary | ICD-10-CM | POA: Diagnosis not present

## 2016-01-28 DIAGNOSIS — R079 Chest pain, unspecified: Secondary | ICD-10-CM | POA: Diagnosis not present

## 2016-01-28 DIAGNOSIS — R0789 Other chest pain: Secondary | ICD-10-CM | POA: Diagnosis not present

## 2016-01-28 DIAGNOSIS — E039 Hypothyroidism, unspecified: Secondary | ICD-10-CM | POA: Diagnosis not present

## 2016-01-28 DIAGNOSIS — I420 Dilated cardiomyopathy: Secondary | ICD-10-CM | POA: Diagnosis not present

## 2016-01-28 DIAGNOSIS — Z79899 Other long term (current) drug therapy: Secondary | ICD-10-CM | POA: Insufficient documentation

## 2016-01-28 DIAGNOSIS — E785 Hyperlipidemia, unspecified: Secondary | ICD-10-CM | POA: Diagnosis not present

## 2016-01-28 DIAGNOSIS — M5126 Other intervertebral disc displacement, lumbar region: Secondary | ICD-10-CM | POA: Insufficient documentation

## 2016-01-28 DIAGNOSIS — I10 Essential (primary) hypertension: Secondary | ICD-10-CM | POA: Insufficient documentation

## 2016-01-28 DIAGNOSIS — Z8541 Personal history of malignant neoplasm of cervix uteri: Secondary | ICD-10-CM | POA: Insufficient documentation

## 2016-01-28 DIAGNOSIS — Z794 Long term (current) use of insulin: Secondary | ICD-10-CM | POA: Diagnosis not present

## 2016-01-28 DIAGNOSIS — F329 Major depressive disorder, single episode, unspecified: Secondary | ICD-10-CM | POA: Diagnosis not present

## 2016-01-28 DIAGNOSIS — R05 Cough: Secondary | ICD-10-CM | POA: Diagnosis not present

## 2016-01-28 LAB — BASIC METABOLIC PANEL
ANION GAP: 5 (ref 5–15)
BUN: 11 mg/dL (ref 6–20)
CALCIUM: 8.4 mg/dL — AB (ref 8.9–10.3)
CHLORIDE: 109 mmol/L (ref 101–111)
CO2: 22 mmol/L (ref 22–32)
CREATININE: 0.71 mg/dL (ref 0.44–1.00)
GFR calc non Af Amer: >60 mL/min (ref >60)
Glucose, Bld: 267 mg/dL — ABNORMAL HIGH (ref 65–99)
Potassium: 4 mmol/L (ref 3.5–5.1)
SODIUM: 136 mmol/L (ref 135–145)

## 2016-01-28 LAB — CBC WITH DIFFERENTIAL/PLATELET
BASOS ABS: 0 10*3/uL (ref 0–0.1)
BASOS PCT: 1 %
Eosinophils Absolute: 0.1 10*3/uL (ref 0–0.7)
Eosinophils Relative: 3 %
HEMATOCRIT: 25.9 % — AB (ref 35.0–47.0)
Hemoglobin: 8.3 g/dL — ABNORMAL LOW (ref 12.0–16.0)
LYMPHS PCT: 20 %
Lymphs Abs: 0.7 10*3/uL — ABNORMAL LOW (ref 1.0–3.6)
MCH: 27.4 pg (ref 26.0–34.0)
MCHC: 32.2 g/dL (ref 32.0–36.0)
MCV: 85 fL (ref 80.0–100.0)
MONO ABS: 0.3 10*3/uL (ref 0.2–0.9)
Monocytes Relative: 9 %
NEUTROS ABS: 2.6 10*3/uL (ref 1.4–6.5)
NEUTROS PCT: 67 %
Platelets: 115 10*3/uL — ABNORMAL LOW (ref 150–440)
RBC: 3.04 MIL/uL — AB (ref 3.80–5.20)
RDW: 17 % — AB (ref 11.5–14.5)
WBC: 3.7 10*3/uL (ref 3.6–11.0)

## 2016-01-28 LAB — TROPONIN I: Troponin I: <0.03 ng/mL (ref <0.031)

## 2016-01-28 MED ORDER — ONDANSETRON HCL 4 MG/2ML IJ SOLN
4.0000 mg | Freq: Once | INTRAMUSCULAR | Status: AC
  Administered 2016-01-28: 4 mg via INTRAVENOUS

## 2016-01-28 MED ORDER — ONDANSETRON HCL 4 MG/2ML IJ SOLN
INTRAMUSCULAR | Status: AC
  Administered 2016-01-28: 4 mg via INTRAVENOUS
  Filled 2016-01-28: qty 2

## 2016-01-28 NOTE — ED Provider Notes (Signed)
[facility] Emergency Department Provider Note    ____________________________________________  Time seen: ~2150  I have reviewed the triage vital signs and the nursing notes.   HISTORY  Chief Complaint Chest Pain   History limited by: Not Limited   HPI [name] is a 60 y.o. female with multiple medical problems who presents to the emergency department today primarily because of complaint for chest pain. She states it is located in her left chest. It is been intermittent. It does radiate underneath her left breast and around to her back. Patient denies any associated shortness of breath. No associated diaphoresis. The patient was recently admitted to the hospital multiple times for complaints most recently for nausea and vomiting. The patient did have some elevated troponins and had a cardiac catheter done earlier this month which did not show any significant coronary artery disease requiring stenting. Today the patient denies any abdominal pain. States that her stomach feels much better.    Past Medical History  Diagnosis Date  . Type 1 diabetes ([place])     on levemir  . Hypothyroid   . Degenerative disk disease   . Stomach ulcer   . Diverticulitis   . Syncope 01/2015  . Anxiety   . GERD (gastroesophageal reflux disease)   . History of hiatal hernia   . Cancer (HCC)     HX OF CANCER OF UTERUS   . TIA (transient ischemic attack) 02/2015  . Hypertension   . Pancreatitis   . PAF (paroxysmal atrial fibrillation) ([place]) 03/2015    a. new onset 03/2015 in setting of intractable N/V; b. on Eliquis 5 mg bid; c. CHADSVASc 4 (DM, TIA x 2, female)  . Intussusception intestine ([place]) 05/2015  . Stroke [facility])     with minimal left sided weakness  . Allergy   . Cirrhosis of liver not due to alcohol ([place]) 2016  . Gastroparesis   . Sick sinus syndrome [facility])     Patient Active Problem List   Diagnosis Date Noted  . Epigastric pain   . Abdominal pain,  epigastric   . Personal history of surgery to heart and great vessels, presenting hazards to health   . Gastritis   . Foreign body in stomach   . Abnormal findings-gastrointestinal tract   . Diarrhea   . PAF (paroxysmal atrial fibrillation) ([place])   . Congestive dilated cardiomyopathy ([place])   . Generalized abdominal pain   . Nausea & vomiting   . NSTEMI (non-ST elevated myocardial infarction) ([place]) 01/11/2016  . Tachyarrhythmia 01/10/2016  . Intractable nausea and vomiting 01/10/2016  . Tachy-brady syndrome ([place]) 12/28/2015  . Abdominal pain 12/27/2015  . Colitis 12/16/2015  . Pneumonia 11/14/2015  . Narcotic withdrawal ([place]) 11/11/2015  . Diabetes mellitus type 2, insulin dependent ([place]) 11/11/2015  . Orthostatic hypotension   . H/O TIA (transient ischemic attack) and stroke   . Left-sided weakness 10/04/2015  . Expressive aphasia 10/04/2015  . Ileus ([place]) 08/01/2015  . Ascites   . Cryptogenic cirrhosis ([place]) 07/10/2015  . Paroxysmal atrial fibrillation ([place]) 07/10/2015  . C. difficile colitis 07/10/2015  . GI (gastrointestinal bleed) 07/06/2015  . Malnutrition of moderate degree 07/04/2015  . Symptomatic bradycardia 05/27/2015  . Intussusception intestine ([place])   . Hypothyroidism due to amiodarone   . Abdominal pain, chronic, epigastric   . Chronic anemia 05/19/2015  . Thrombocytopenia ([place]) 05/19/2015  . Prolonged QT interval   . Hypomagnesemia   . Narcotic abuse   . Uncontrolled type 2  diabetes mellitus with gastroparesis ([place])   . Syncope due to orthostatic hypotension 05/18/2015  . Hypokalemia 04/06/2015  . Hyperlipidemia with target LDL less than 100 04/06/2015  . CVA (cerebral infarction) 02/15/2015  . Essential hypertension 01/12/2015  . Chronically on opiate therapy 01/12/2015  . Gastroparesis 01/12/2015  . DEPRESSION/ANXIETY 06/27/2007  . MYOFASCIAL PAIN SYNDROME 06/27/2007  . Chronic pain syndrome 03/28/2007  . GERD 03/27/2007  . DIVERTICULOSIS, COLON  03/27/2007  . LUMBAR DISC DISPLACEMENT 03/27/2007  . PROTEINURIA 03/27/2007  . UTERINE CANCER, HX OF 03/27/2007    Past Surgical History  Procedure Laterality Date  . Hernia repair    . Abdominal hysterectomy    . Cholecystectomy    . Esophagogastroduodenoscopy N/A 04/04/2015    Procedure: ESOPHAGOGASTRODUODENOSCOPY (EGD);  Surgeon: Hulen Luster, MD;  Location: [facility] ENDOSCOPY;  Service: Endoscopy;  Laterality: N/A;  . Cardiac catheterization N/A 01/12/2016    Procedure: Left Heart Cath and Coronary Angiography;  Surgeon: Wellington Hampshire, MD;  Location: [facility] CV LAB;  Service: Cardiovascular;  Laterality: N/A;  . Esophagogastroduodenoscopy (egd) with propofol N/A 01/18/2016    Procedure: ESOPHAGOGASTRODUODENOSCOPY (EGD) WITH PROPOFOL;  Surgeon: Lucilla Lame, MD;  Location: [facility] ENDOSCOPY;  Service: Endoscopy;  Laterality: N/A;  . Flexible sigmoidoscopy N/A 01/18/2016    Procedure: FLEXIBLE SIGMOIDOSCOPY;  Surgeon: Lucilla Lame, MD;  Location: [facility] ENDOSCOPY;  Service: Endoscopy;  Laterality: N/A;    Current Outpatient Rx  Name  Route  Sig  Dispense  Refill  . acidophilus (RISAQUAD) CAPS capsule   Oral   Take 2 capsules by mouth daily.   60 capsule   0   . albuterol (PROVENTIL HFA;VENTOLIN HFA) 108 (90 Base) MCG/ACT inhaler   Inhalation   Inhale 2 puffs into the lungs every 6 (six) hours as needed for wheezing or shortness of breath.   1 Inhaler   2   . amLODipine (NORVASC) 5 MG tablet   Oral   Take 5 mg by mouth daily.         Marland Kitchen apixaban (ELIQUIS) 5 MG TABS tablet   Oral   Take 1 tablet (5 mg total) by mouth 2 (two) times daily.   60 tablet   0   . atorvastatin (LIPITOR) 40 MG tablet   Oral   Take 40 mg by mouth at bedtime.         . bismuth subsalicylate (PEPTO BISMOL) 262 MG/15ML suspension   Oral   Take 30 mLs by mouth every 4 (four) hours as needed for diarrhea or loose stools.   360 mL   0   . budesonide-formoterol (SYMBICORT) 160-4.5 MCG/ACT inhaler    Inhalation   Inhale 2 puffs into the lungs 2 (two) times daily.   1 Inhaler   12   . dicyclomine (BENTYL) 10 MG capsule   Oral   Take 10 mg by mouth 4 (four) times daily -  before meals and at bedtime.          . insulin aspart (NOVOLOG) 100 UNIT/ML injection   Subcutaneous   Inject into the skin 3 (three) times daily with meals as needed for high blood sugar. Pt uses as needed per sliding scale.         . insulin detemir (LEVEMIR) 100 UNIT/ML injection   Subcutaneous   Inject 0.1 mLs (10 Units total) into the skin at bedtime.   10 mL   11   . levothyroxine (SYNTHROID, LEVOTHROID) 75 MCG tablet   Oral   Take  1 tablet (75 mcg total) by mouth daily before breakfast.   30 tablet   1   . metoCLOPramide (REGLAN) 5 MG tablet               . metoprolol tartrate (LOPRESSOR) 25 MG tablet   Oral   Take 25 mg by mouth 2 (two) times daily.          Marland Kitchen morphine (MS CONTIN) 15 MG 12 hr tablet   Oral   Take 1 tablet (15 mg total) by mouth every 8 (eight) hours. Patient not taking: Reported on 01/21/2016   75 tablet   0   . oxyCODONE (OXY IR/ROXICODONE) 5 MG immediate release tablet   Oral   Take 5-10 mg by mouth every 6 (six) hours as needed for severe pain.         . pantoprazole (PROTONIX) 40 MG tablet   Oral   Take 40 mg by mouth 2 (two) times daily before a meal.         . rifaximin (XIFAXAN) 550 MG TABS tablet   Oral   Take 1 tablet (550 mg total) by mouth 2 (two) times daily.   60 tablet   6   . tiZANidine (ZANAFLEX) 4 MG tablet   Oral   Take 4 mg by mouth 3 (three) times daily as needed for muscle spasms.            Allergies Hydrocodone; Aspirin; Erythromycin; Prednisone; Rosiglitazone maleate; Codeine sulfate; and Tetanus-diphtheria toxoids td  Family History  Problem Relation Age of Onset  . Hypertension Mother   . CAD Sister   . Heart attack Sister     Deceased 26-Nov-2014  . CAD Brother     Social History Social History  Substance Use  Topics  . Smoking status: Never Smoker   . Smokeless tobacco: Never Used  . Alcohol Use: No    Review of Systems  Constitutional: Negative for fever. Cardiovascular: Positive for chest pain. Respiratory: Negative for shortness of breath. Gastrointestinal: Negative for abdominal pain, vomiting and diarrhea. Neurological: Negative for headaches, focal weakness or numbness.   10-point ROS otherwise negative.  ____________________________________________   PHYSICAL EXAM:  VITAL SIGNS: ED Triage Vitals  Enc Vitals Group     BP 01/28/16 11-27-2103 122/72 mmHg     Pulse Rate 01/28/16 Nov 27, 2103 55     Resp 01/28/16 26-Nov-2104 15     Temp 01/28/16 26-Nov-2104 98.3 F (36.8 C)     Temp src --      SpO2 01/28/16 Nov 27, 2103 97 %     Weight --      Height --      Head Cir --      Peak Flow --      Pain Score 01/28/16 2099-11-26 8   Constitutional: Alert and oriented. Well appearing and in no distress. Eyes: Conjunctivae are normal. PERRL. Normal extraocular movements. ENT   Head: Normocephalic and atraumatic.   Nose: No congestion/rhinnorhea.   Mouth/Throat: Mucous membranes are moist.   Neck: No stridor. Hematological/Lymphatic/Immunilogical: No cervical lymphadenopathy. Cardiovascular: Normal rate, regular rhythm.  No murmurs, rubs, or gallops. Respiratory: Normal respiratory effort without tachypnea nor retractions. Breath sounds are clear and equal bilaterally. No wheezes/rales/rhonchi. Gastrointestinal: Soft and nontender. No distention.  Genitourinary: Deferred Musculoskeletal: Normal range of motion in all extremities. No joint effusions.  No lower extremity tenderness nor edema. Neurologic:  Normal speech and language. No gross focal neurologic deficits are appreciated.  Skin:  Skin is warm, dry  and intact. No rash noted. Psychiatric: Mood and affect are normal. Speech and behavior are normal. Patient exhibits appropriate insight and judgment.  ____________________________________________     LABS (pertinent positives/negatives)  Labs Reviewed  CBC WITH DIFFERENTIAL/PLATELET - Abnormal; Notable for the following:    RBC 3.04 (*)    Hemoglobin 8.3 (*)    HCT 25.9 (*)    RDW 17.0 (*)    Platelets 115 (*)    Lymphs Abs 0.7 (*)    All other components within normal limits  BASIC METABOLIC PANEL - Abnormal; Notable for the following:    Glucose, Bld 267 (*)    Calcium 8.4 (*)    All other components within normal limits  TROPONIN I  TROPONIN I   2nd trop pending at time of sign out  ____________________________________________   EKG  I, Nance Pear, attending physician, personally viewed and interpreted this EKG  EKG Time: 2107 Rate: 54 Rhythm: ? afib Axis: normal Intervals: qtc 582 QRS: narrow, q waves V1, V2 ST changes: no st elevation Impression: abnormal ekg   ____________________________________________    RADIOLOGY  CXR IMPRESSION: Lungs mildly hypoexpanded, with mild elevation of the right hemidiaphragm. Borderline cardiomegaly.  ____________________________________________   PROCEDURES  Procedure(s) performed: None  Critical Care performed: No  ____________________________________________   INITIAL IMPRESSION / ASSESSMENT AND PLAN / ED COURSE  Pertinent labs & imaging results that were available during my care of the patient were reviewed by me and considered in my medical decision making (see chart for details).  Patient presented to the emergency department today because of concerns for chest pain. Initial troponin negative. Patient did have a recent admission which consisted of a cardiac catheter which did not show any lesions requiring intervention. Given recent cardiac catheterization will check a second troponin. If negative feel like patient will likely be able to be discharged by the oncoming provider. Will prepare paperwork in this event.  ____________________________________________   FINAL CLINICAL IMPRESSION(S) /  ED DIAGNOSES  Final diagnoses:  Chest pain, unspecified chest pain type     Note: This dictation was prepared with Dragon dictation. Any transcriptional errors that result from this process are unintentional    Nance Pear, MD 01/28/16 2235

## 2016-01-28 NOTE — Discharge Instructions (Signed)
Please seek medical attention for any high fevers, chest pain, shortness of breath, change in behavior, persistent vomiting, bloody stool or any other new or concerning symptoms. ° ° °Nonspecific Chest Pain °It is often hard to find the cause of chest pain. There is always a chance that your pain could be related to something serious, such as a heart attack or a blood clot in your lungs. Chest pain can also be caused by conditions that are not life-threatening. If you have chest pain, it is very important to follow up with your doctor. ° °HOME CARE °· If you were prescribed an antibiotic medicine, finish it all even if you start to feel better. °· Avoid any activities that cause chest pain. °· Do not use any tobacco products, including cigarettes, chewing tobacco, or electronic cigarettes. If you need help quitting, ask your doctor. °· Do not drink alcohol. °· Take medicines only as told by your doctor. °· Keep all follow-up visits as told by your doctor. This is important. This includes any further testing if your chest pain does not go away. °· Your doctor may tell you to keep your head raised (elevated) while you sleep. °· Make lifestyle changes as told by your doctor. These may include: °¨ Getting regular exercise. Ask your doctor to suggest some activities that are safe for you. °¨ Eating a heart-healthy diet. Your doctor or a diet specialist (dietitian) can help you to learn healthy eating options. °¨ Maintaining a healthy weight. °¨ Managing diabetes, if necessary. °¨ Reducing stress. °GET HELP IF: °· Your chest pain does not go away, even after treatment. °· You have a rash with blisters on your chest. °· You have a fever. °GET HELP RIGHT AWAY IF: °· Your chest pain is worse. °· You have an increasing cough, or you cough up blood. °· You have severe belly (abdominal) pain. °· You feel extremely weak. °· You pass out (faint). °· You have chills. °· You have sudden, unexplained chest discomfort. °· You have  sudden, unexplained discomfort in your arms, back, neck, or jaw. °· You have shortness of breath at any time. °· You suddenly start to sweat, or your skin gets clammy. °· You feel nauseous. °· You vomit. °· You suddenly feel light-headed or dizzy. °· Your heart begins to beat quickly, or it feels like it is skipping beats. °These symptoms may be an emergency. Do not wait to see if the symptoms will go away. Get medical help right away. Call your local emergency services (911 in the [place] Do not drive yourself to the hospital. °  °This information is not intended to replace advice given to you by your health care provider. Make sure you discuss any questions you have with your health care provider. °  °Document Released: 02/13/2008 Document Revised: 09/17/2014 Document Reviewed: 04/02/2014 °Elsevier Interactive Patient Education ©2016 [employer] ° °

## 2016-01-28 NOTE — ED Notes (Signed)
Pt from home via EMS, reports chest pain x 4 hrs . Reports dizziness, nausea and SOB

## 2016-01-28 NOTE — ED Notes (Signed)
Pt. Reports one bowel movement yesterday and three today with dark runny stools.

## 2016-01-28 NOTE — ED Notes (Signed)
Pt. Reports left sided chest pain that started today at around 2 pm.  Pt. Also reports she fell this afternoon.  Pt. Reports feeling dizzy for the past two days.  Pt. Reports no injury from fall.

## 2016-01-29 LAB — TROPONIN I: Troponin I: <0.03 ng/mL (ref <0.031)

## 2016-01-29 MED ORDER — GI COCKTAIL ~~LOC~~
30.0000 mL | Freq: Once | ORAL | Status: AC
  Administered 2016-01-29: 30 mL via ORAL
  Filled 2016-01-29: qty 30

## 2016-01-29 MED ORDER — KETOROLAC TROMETHAMINE 30 MG/ML IJ SOLN
30.0000 mg | Freq: Once | INTRAMUSCULAR | Status: AC
  Administered 2016-01-29: 30 mg via INTRAVENOUS
  Filled 2016-01-29: qty 1

## 2016-01-29 NOTE — ED Provider Notes (Signed)
-----------------------------------------   2:04 AM on 01/29/2016 -----------------------------------------   Blood pressure 117/58, pulse 55, temperature 98.3 F (36.8 C), resp. rate 27, SpO2 95 %.  Assuming care from Dr. Archie Balboa.  In short, [name] is a 60 y.o. female with a chief complaint of Chest Pain .  Refer to the original H&P for additional details.  The current plan of care is to follow-up the repeat troponin.  The patient did receive a GI cocktail and Toradol for her chest pain.  The patient's repeat troponin is negative. She'll be discharged home.  Loney Hering, MD 01/29/16 [phone]

## 2016-01-29 NOTE — ED Notes (Signed)
Pt. Going to waiting room to wait for ride.

## 2016-01-30 ENCOUNTER — Ambulatory Visit: Payer: Self-pay | Admitting: *Deleted

## 2016-01-31 ENCOUNTER — Encounter: Payer: Self-pay | Admitting: *Deleted

## 2016-01-31 ENCOUNTER — Encounter: Payer: Commercial Managed Care - HMO | Admitting: Nurse Practitioner

## 2016-02-01 ENCOUNTER — Other Ambulatory Visit: Payer: Self-pay | Admitting: *Deleted

## 2016-02-01 NOTE — Patient Outreach (Signed)
[facility]) Care Management [facility] CM Care Coordination Outreach Attempt 02/01/2016  [name] 28-Sep-1955 [id number]  [name] is a 60 y/o female referred to [facility] for multiple ED/ Hospital admissions; patient was most recently admitted May 13-17, 2017, for ongoing epigastric pain. Noted that patient also had an ED visit for chest pain on Jan 28, 2016, and was discharged home.  I had previously spoken with [name], Practice Manager for [facility] of [place]/ [name], on Jan 27, 2016, to coordinate outreach attempts to patient, as patient has consistently not been able to successfully reach with [place]  telephone outreach attempts; [name] had confirmed that [name] had a scheduled office visit with [name] today, Feb 01, 2016 at 11:00 am,  and we had planned joint visit with [place] during this scheduled visit for care coordination, secondary to patient's multiple recent IP hospital admissions/ ED visits.   [name] did not show up today for this scheduled office visit with her PCP, [name].  [name] confirmed that she would contact me should the patient reach out to [name] practice in follow up of her missed appointment today.  [name], RN, BSN, [employer] [facility] Care Management  [phone]

## 2016-02-03 ENCOUNTER — Other Ambulatory Visit: Payer: Self-pay | Admitting: *Deleted

## 2016-02-03 DIAGNOSIS — Z794 Long term (current) use of insulin: Secondary | ICD-10-CM | POA: Insufficient documentation

## 2016-02-03 DIAGNOSIS — E032 Hypothyroidism due to medicaments and other exogenous substances: Secondary | ICD-10-CM | POA: Diagnosis not present

## 2016-02-03 DIAGNOSIS — D649 Anemia, unspecified: Secondary | ICD-10-CM | POA: Insufficient documentation

## 2016-02-03 DIAGNOSIS — R112 Nausea with vomiting, unspecified: Secondary | ICD-10-CM | POA: Insufficient documentation

## 2016-02-03 DIAGNOSIS — R188 Other ascites: Secondary | ICD-10-CM | POA: Diagnosis not present

## 2016-02-03 DIAGNOSIS — K859 Acute pancreatitis without necrosis or infection, unspecified: Secondary | ICD-10-CM | POA: Insufficient documentation

## 2016-02-03 DIAGNOSIS — Z8719 Personal history of other diseases of the digestive system: Secondary | ICD-10-CM | POA: Insufficient documentation

## 2016-02-03 DIAGNOSIS — Z887 Allergy status to serum and vaccine status: Secondary | ICD-10-CM | POA: Diagnosis not present

## 2016-02-03 DIAGNOSIS — N200 Calculus of kidney: Secondary | ICD-10-CM | POA: Insufficient documentation

## 2016-02-03 DIAGNOSIS — Z7901 Long term (current) use of anticoagulants: Secondary | ICD-10-CM | POA: Insufficient documentation

## 2016-02-03 DIAGNOSIS — K219 Gastro-esophageal reflux disease without esophagitis: Secondary | ICD-10-CM | POA: Insufficient documentation

## 2016-02-03 DIAGNOSIS — Z8542 Personal history of malignant neoplasm of other parts of uterus: Secondary | ICD-10-CM | POA: Insufficient documentation

## 2016-02-03 DIAGNOSIS — Z90710 Acquired absence of both cervix and uterus: Secondary | ICD-10-CM | POA: Insufficient documentation

## 2016-02-03 DIAGNOSIS — E785 Hyperlipidemia, unspecified: Secondary | ICD-10-CM | POA: Diagnosis not present

## 2016-02-03 DIAGNOSIS — G894 Chronic pain syndrome: Secondary | ICD-10-CM | POA: Diagnosis not present

## 2016-02-03 DIAGNOSIS — E440 Moderate protein-calorie malnutrition: Secondary | ICD-10-CM | POA: Diagnosis not present

## 2016-02-03 DIAGNOSIS — T462X5A Adverse effect of other antidysrhythmic drugs, initial encounter: Secondary | ICD-10-CM | POA: Insufficient documentation

## 2016-02-03 DIAGNOSIS — E1043 Type 1 diabetes mellitus with diabetic autonomic (poly)neuropathy: Secondary | ICD-10-CM | POA: Diagnosis not present

## 2016-02-03 DIAGNOSIS — K449 Diaphragmatic hernia without obstruction or gangrene: Secondary | ICD-10-CM | POA: Insufficient documentation

## 2016-02-03 DIAGNOSIS — I495 Sick sinus syndrome: Secondary | ICD-10-CM | POA: Diagnosis not present

## 2016-02-03 DIAGNOSIS — Z8249 Family history of ischemic heart disease and other diseases of the circulatory system: Secondary | ICD-10-CM | POA: Insufficient documentation

## 2016-02-03 DIAGNOSIS — K7469 Other cirrhosis of liver: Secondary | ICD-10-CM | POA: Insufficient documentation

## 2016-02-03 DIAGNOSIS — Z8673 Personal history of transient ischemic attack (TIA), and cerebral infarction without residual deficits: Secondary | ICD-10-CM | POA: Insufficient documentation

## 2016-02-03 DIAGNOSIS — Z888 Allergy status to other drugs, medicaments and biological substances status: Secondary | ICD-10-CM | POA: Diagnosis not present

## 2016-02-03 DIAGNOSIS — I480 Paroxysmal atrial fibrillation: Secondary | ICD-10-CM | POA: Diagnosis not present

## 2016-02-03 DIAGNOSIS — I420 Dilated cardiomyopathy: Secondary | ICD-10-CM | POA: Diagnosis not present

## 2016-02-03 DIAGNOSIS — F329 Major depressive disorder, single episode, unspecified: Secondary | ICD-10-CM | POA: Insufficient documentation

## 2016-02-03 DIAGNOSIS — E876 Hypokalemia: Secondary | ICD-10-CM | POA: Insufficient documentation

## 2016-02-03 DIAGNOSIS — K3184 Gastroparesis: Secondary | ICD-10-CM | POA: Insufficient documentation

## 2016-02-03 DIAGNOSIS — I951 Orthostatic hypotension: Secondary | ICD-10-CM | POA: Insufficient documentation

## 2016-02-03 DIAGNOSIS — Z885 Allergy status to narcotic agent status: Secondary | ICD-10-CM | POA: Insufficient documentation

## 2016-02-03 DIAGNOSIS — R1111 Vomiting without nausea: Secondary | ICD-10-CM | POA: Diagnosis not present

## 2016-02-03 DIAGNOSIS — I252 Old myocardial infarction: Secondary | ICD-10-CM | POA: Diagnosis not present

## 2016-02-03 DIAGNOSIS — R197 Diarrhea, unspecified: Secondary | ICD-10-CM | POA: Diagnosis not present

## 2016-02-03 DIAGNOSIS — I10 Essential (primary) hypertension: Secondary | ICD-10-CM | POA: Diagnosis not present

## 2016-02-03 DIAGNOSIS — R109 Unspecified abdominal pain: Secondary | ICD-10-CM | POA: Diagnosis not present

## 2016-02-03 DIAGNOSIS — K2970 Gastritis, unspecified, without bleeding: Secondary | ICD-10-CM | POA: Insufficient documentation

## 2016-02-03 DIAGNOSIS — M5126 Other intervertebral disc displacement, lumbar region: Secondary | ICD-10-CM | POA: Diagnosis not present

## 2016-02-03 DIAGNOSIS — E860 Dehydration: Secondary | ICD-10-CM | POA: Diagnosis not present

## 2016-02-03 DIAGNOSIS — F419 Anxiety disorder, unspecified: Secondary | ICD-10-CM | POA: Diagnosis not present

## 2016-02-03 DIAGNOSIS — R1110 Vomiting, unspecified: Secondary | ICD-10-CM | POA: Diagnosis present

## 2016-02-03 DIAGNOSIS — R4701 Aphasia: Secondary | ICD-10-CM | POA: Insufficient documentation

## 2016-02-03 MED ORDER — ONDANSETRON 4 MG PO TBDP
4.0000 mg | ORAL_TABLET | Freq: Once | ORAL | Status: AC | PRN
  Administered 2016-02-03: 4 mg via ORAL

## 2016-02-03 MED ORDER — ONDANSETRON 4 MG PO TBDP
ORAL_TABLET | ORAL | Status: AC
  Filled 2016-02-03: qty 32

## 2016-02-03 MED ORDER — ONDANSETRON HCL 4 MG/2ML IJ SOLN
4.0000 mg | Freq: Once | INTRAMUSCULAR | Status: AC | PRN
  Administered 2016-02-04: 4 mg via INTRAVENOUS
  Filled 2016-02-03: qty 2

## 2016-02-03 MED ORDER — ONDANSETRON 4 MG PO TBDP
4.0000 mg | ORAL_TABLET | Freq: Once | ORAL | Status: AC
  Administered 2016-02-03: 4 mg via ORAL
  Filled 2016-02-03: qty 1

## 2016-02-03 NOTE — ED Notes (Signed)
Patient reports symptoms began yesterday with nausea, vomiting and diarrhea.

## 2016-02-03 NOTE — Patient Outreach (Signed)
[facility]) Care Management [place] Telephone Outreach/ Transition of Care outreach attempt #3 02/03/2016  OLUWATENIOLA LEITCH 1955-11-20 [id number]  Unsuccessful telephone outreach to Ms. Jesyca Weisenburger, 60 y/o female referred to [facility] for multiple ED/ hospital admissions; patient was most recently admitted May 13-17, 2017, for ongoing epigastric pain.  Outreach attempt was also made earlier this week during patient's scheduled office visit with her PCP on Wednesday, Feb 01, 2016, but patient was "no-show," to scheduled visit.  Today's call attempt was answered by automated outgoing VM msg stated, "the person you are trying to reach is not available, please try your call later," and would not allow me to leave a VM msg.  Plan: Will plan to close [facility] CM case as patient consistently cannot be reached.  [name], RN, BSN, [employer] [facility] Care Management  [phone]

## 2016-02-03 NOTE — ED Notes (Signed)
Attempted IV x2 without success  

## 2016-02-04 ENCOUNTER — Emergency Department: Payer: Commercial Managed Care - HMO

## 2016-02-04 ENCOUNTER — Observation Stay
Admission: EM | Admit: 2016-02-04 | Discharge: 2016-02-07 | Disposition: A | Discharge status: Home / Self Care | Payer: Commercial Managed Care - HMO | Attending: Internal Medicine | Admitting: Internal Medicine

## 2016-02-04 DIAGNOSIS — R188 Other ascites: Secondary | ICD-10-CM | POA: Diagnosis not present

## 2016-02-04 DIAGNOSIS — R112 Nausea with vomiting, unspecified: Secondary | ICD-10-CM | POA: Diagnosis not present

## 2016-02-04 DIAGNOSIS — R197 Diarrhea, unspecified: Secondary | ICD-10-CM | POA: Diagnosis not present

## 2016-02-04 DIAGNOSIS — K3184 Gastroparesis: Secondary | ICD-10-CM

## 2016-02-04 DIAGNOSIS — R109 Unspecified abdominal pain: Secondary | ICD-10-CM

## 2016-02-04 DIAGNOSIS — K529 Noninfective gastroenteritis and colitis, unspecified: Secondary | ICD-10-CM

## 2016-02-04 DIAGNOSIS — R1110 Vomiting, unspecified: Secondary | ICD-10-CM

## 2016-02-04 DIAGNOSIS — A084 Viral intestinal infection, unspecified: Secondary | ICD-10-CM | POA: Diagnosis not present

## 2016-02-04 LAB — URINALYSIS COMPLETE WITH MICROSCOPIC (ARMC ONLY)
BILIRUBIN URINE: NEGATIVE
Glucose, UA: >500 mg/dL — AB
Leukocytes, UA: NEGATIVE
Nitrite: NEGATIVE
PROTEIN: 30 mg/dL — AB
SPECIFIC GRAVITY, URINE: 1.015 (ref 1.005–1.030)
pH: 5 (ref 5.0–8.0)

## 2016-02-04 LAB — COMPREHENSIVE METABOLIC PANEL
ALBUMIN: 3.6 g/dL (ref 3.5–5.0)
ALK PHOS: 106 U/L (ref 38–126)
ALT: 28 U/L (ref 14–54)
ANION GAP: 13 (ref 5–15)
AST: 37 U/L (ref 15–41)
BUN: 12 mg/dL (ref 6–20)
CALCIUM: 8.9 mg/dL (ref 8.9–10.3)
CO2: 18 mmol/L — AB (ref 22–32)
Chloride: 108 mmol/L (ref 101–111)
Creatinine, Ser: 0.81 mg/dL (ref 0.44–1.00)
GFR calc Af Amer: >60 mL/min (ref >60)
GFR calc non Af Amer: >60 mL/min (ref >60)
GLUCOSE: 261 mg/dL — AB (ref 65–99)
Potassium: 3.4 mmol/L — ABNORMAL LOW (ref 3.5–5.1)
SODIUM: 139 mmol/L (ref 135–145)
Total Bilirubin: 1.3 mg/dL — ABNORMAL HIGH (ref 0.3–1.2)
Total Protein: 6.7 g/dL (ref 6.5–8.1)

## 2016-02-04 LAB — GLUCOSE, CAPILLARY
GLUCOSE-CAPILLARY: 275 mg/dL — AB (ref 65–99)
GLUCOSE-CAPILLARY: 145 mg/dL — AB (ref 65–99)
GLUCOSE-CAPILLARY: 146 mg/dL — AB (ref 65–99)

## 2016-02-04 LAB — CBC
HCT: 32 % — ABNORMAL LOW (ref 35.0–47.0)
HEMOGLOBIN: 10.3 g/dL — AB (ref 12.0–16.0)
MCH: 27.1 pg (ref 26.0–34.0)
MCHC: 32.2 g/dL (ref 32.0–36.0)
MCV: 84 fL (ref 80.0–100.0)
Platelets: 138 10*3/uL — ABNORMAL LOW (ref 150–440)
RBC: 3.81 MIL/uL (ref 3.80–5.20)
RDW: 17.1 % — ABNORMAL HIGH (ref 11.5–14.5)
WBC: 6.7 10*3/uL (ref 3.6–11.0)

## 2016-02-04 LAB — LIPASE, BLOOD: Lipase: 20 U/L (ref 11–51)

## 2016-02-04 MED ORDER — ACETAMINOPHEN 650 MG RE SUPP: 650.0000 mg | Freq: Four times a day (QID) | RECTAL | Status: DC | PRN

## 2016-02-04 MED ORDER — INSULIN ASPART 100 UNIT/ML ~~LOC~~ SOLN
0.0000 [IU] | Freq: Three times a day (TID) | SUBCUTANEOUS | Status: DC
  Administered 2016-02-04: 3 [IU] via SUBCUTANEOUS
  Administered 2016-02-04: 12:00:00 11 [IU] via SUBCUTANEOUS
  Administered 2016-02-05: 7 [IU] via SUBCUTANEOUS
  Administered 2016-02-05 (×2): 4 [IU] via SUBCUTANEOUS
  Administered 2016-02-06: 7 [IU] via SUBCUTANEOUS
  Administered 2016-02-06 (×2): 4 [IU] via SUBCUTANEOUS
  Administered 2016-02-07: 7 [IU] via SUBCUTANEOUS
  Filled 2016-02-04: qty 4
  Filled 2016-02-04 (×2): qty 7
  Filled 2016-02-04: qty 3
  Filled 2016-02-04 (×2): qty 4
  Filled 2016-02-04: qty 11
  Filled 2016-02-04: qty 7
  Filled 2016-02-04: qty 4

## 2016-02-04 MED ORDER — LEVOTHYROXINE SODIUM 75 MCG PO TABS
75.0000 ug | ORAL_TABLET | Freq: Every day | ORAL | Status: DC
  Administered 2016-02-05 – 2016-02-07 (×3): 75 ug via ORAL
  Filled 2016-02-04 (×3): qty 1

## 2016-02-04 MED ORDER — ALBUTEROL SULFATE HFA 108 (90 BASE) MCG/ACT IN AERS: 2.0000 | INHALATION_SPRAY | Freq: Four times a day (QID) | RESPIRATORY_TRACT | Status: DC | PRN

## 2016-02-04 MED ORDER — ALBUTEROL SULFATE (2.5 MG/3ML) 0.083% IN NEBU: 2.5000 mg | INHALATION_SOLUTION | Freq: Four times a day (QID) | RESPIRATORY_TRACT | Status: DC | PRN

## 2016-02-04 MED ORDER — MORPHINE SULFATE (PF) 2 MG/ML IV SOLN
2.0000 mg | INTRAVENOUS | Status: DC | PRN
  Administered 2016-02-04 – 2016-02-07 (×16): 2 mg via INTRAVENOUS
  Filled 2016-02-04 (×16): qty 1

## 2016-02-04 MED ORDER — SODIUM CHLORIDE 0.9 % IV BOLUS (SEPSIS)
1000.0000 mL | Freq: Once | INTRAVENOUS | Status: AC
  Administered 2016-02-04: 1000 mL via INTRAVENOUS

## 2016-02-04 MED ORDER — AMLODIPINE BESYLATE 5 MG PO TABS
5.0000 mg | ORAL_TABLET | Freq: Every day | ORAL | Status: DC
  Administered 2016-02-04 – 2016-02-07 (×4): 5 mg via ORAL
  Filled 2016-02-04 (×4): qty 1

## 2016-02-04 MED ORDER — MORPHINE SULFATE (PF) 4 MG/ML IV SOLN
4.0000 mg | Freq: Once | INTRAVENOUS | Status: AC
  Administered 2016-02-04: 4 mg via INTRAVENOUS
  Filled 2016-02-04: qty 1

## 2016-02-04 MED ORDER — INSULIN ASPART 100 UNIT/ML ~~LOC~~ SOLN
0.0000 [IU] | Freq: Every day | SUBCUTANEOUS | Status: DC
  Administered 2016-02-06: 2 [IU] via SUBCUTANEOUS
  Filled 2016-02-04: qty 2

## 2016-02-04 MED ORDER — RIFAXIMIN 550 MG PO TABS
550.0000 mg | ORAL_TABLET | Freq: Two times a day (BID) | ORAL | Status: DC
  Administered 2016-02-04 – 2016-02-07 (×7): 550 mg via ORAL
  Filled 2016-02-04 (×7): qty 1

## 2016-02-04 MED ORDER — PROCHLORPERAZINE EDISYLATE 5 MG/ML IJ SOLN
10.0000 mg | INTRAMUSCULAR | Status: DC | PRN
Start: 2016-02-04 — End: 2016-02-07
  Administered 2016-02-04 – 2016-02-07 (×17): 10 mg via INTRAVENOUS
  Filled 2016-02-04 (×17): qty 2

## 2016-02-04 MED ORDER — ENOXAPARIN SODIUM 40 MG/0.4ML ~~LOC~~ SOLN: 40.0000 mg | SUBCUTANEOUS | Status: DC

## 2016-02-04 MED ORDER — APIXABAN 5 MG PO TABS
5.0000 mg | ORAL_TABLET | Freq: Two times a day (BID) | ORAL | Status: DC
  Administered 2016-02-04 – 2016-02-07 (×7): 5 mg via ORAL
  Filled 2016-02-04 (×7): qty 1

## 2016-02-04 MED ORDER — MORPHINE SULFATE (PF) 4 MG/ML IV SOLN: 4.0000 mg | Freq: Once | INTRAVENOUS | Status: DC

## 2016-02-04 MED ORDER — POTASSIUM CHLORIDE IN NACL 20-0.9 MEQ/L-% IV SOLN
INTRAVENOUS | Status: DC
  Administered 2016-02-04 – 2016-02-05 (×4): via INTRAVENOUS
  Filled 2016-02-04 (×6): qty 1000

## 2016-02-04 MED ORDER — DICYCLOMINE HCL 10 MG PO CAPS
10.0000 mg | ORAL_CAPSULE | Freq: Three times a day (TID) | ORAL | Status: DC
  Administered 2016-02-04 – 2016-02-07 (×12): 10 mg via ORAL
  Filled 2016-02-04 (×12): qty 1

## 2016-02-04 MED ORDER — LORAZEPAM 0.5 MG PO TABS
ORAL_TABLET | ORAL | Status: AC
  Filled 2016-02-04: qty 1

## 2016-02-04 MED ORDER — METOPROLOL TARTRATE 25 MG PO TABS
25.0000 mg | ORAL_TABLET | Freq: Two times a day (BID) | ORAL | Status: DC
  Administered 2016-02-04 – 2016-02-06 (×6): 25 mg via ORAL
  Filled 2016-02-04 (×6): qty 1

## 2016-02-04 MED ORDER — LORAZEPAM 2 MG/ML IJ SOLN: 0.5000 mg | Freq: Once | INTRAMUSCULAR | Status: DC

## 2016-02-04 MED ORDER — MOMETASONE FURO-FORMOTEROL FUM 200-5 MCG/ACT IN AERO
2.0000 | INHALATION_SPRAY | Freq: Two times a day (BID) | RESPIRATORY_TRACT | Status: DC
  Administered 2016-02-04 – 2016-02-07 (×7): 2 via RESPIRATORY_TRACT
  Filled 2016-02-04: qty 8.8

## 2016-02-04 MED ORDER — RISAQUAD PO CAPS
2.0000 | ORAL_CAPSULE | Freq: Every day | ORAL | Status: DC
  Administered 2016-02-04 – 2016-02-07 (×4): 2 via ORAL
  Filled 2016-02-04 (×4): qty 2

## 2016-02-04 MED ORDER — ATORVASTATIN CALCIUM 20 MG PO TABS
40.0000 mg | ORAL_TABLET | Freq: Every day | ORAL | Status: DC
  Administered 2016-02-04 – 2016-02-06 (×3): 40 mg via ORAL
  Filled 2016-02-04 (×3): qty 2

## 2016-02-04 MED ORDER — ACETAMINOPHEN 325 MG PO TABS: 650.0000 mg | ORAL_TABLET | Freq: Four times a day (QID) | ORAL | Status: DC | PRN

## 2016-02-04 MED ORDER — MORPHINE SULFATE (PF) 2 MG/ML IV SOLN
INTRAVENOUS | Status: AC
  Filled 2016-02-04: qty 1

## 2016-02-04 MED ORDER — LORAZEPAM 0.5 MG PO TABS
0.5000 mg | ORAL_TABLET | Freq: Once | ORAL | Status: AC
  Administered 2016-02-04: 0.5 mg via ORAL
  Filled 2016-02-04: qty 1

## 2016-02-04 MED ORDER — LORAZEPAM 0.5 MG PO TABS
0.5000 mg | ORAL_TABLET | Freq: Once | ORAL | Status: AC
  Administered 2016-02-04: 0.5 mg via ORAL

## 2016-02-04 NOTE — ED Notes (Signed)
Pt is in room "gagging" and making "gagging" noises but no actual emesis at this time - when nurse enters room and speaks to the pt she stops and talks with this Probation officer

## 2016-02-04 NOTE — ED Provider Notes (Signed)
[facility] Emergency Department Provider Note  ____________________________________________  Time seen: Approximately 2:48 AM  I have reviewed the triage vital signs and the nursing notes.   HISTORY  Chief Complaint Emesis and Diarrhea    HPI [name] is a 60 y.o. female  for evaluation of vomiting and abdominal pain. She reports that she is having "gastroparesis".  Patient reports he's had symptoms same many times, and that her diabetes causes her to start throwing up and her stomach has gastroparesis. She is unable to keep anything down for the last day, reports nausea and vomiting up anything including secretions. Not having any chest pain or shortness of breath. She reports pain in the upper abdomen, no fevers, no chills. Severe pain upper abdomen.  Patient reports same many times in the past. Denies urinary symptoms. No chest pain or shortness of breath.  Past Medical History  Diagnosis Date  . Type 1 diabetes ([place])     on levemir  . Hypothyroid   . Degenerative disk disease   . Stomach ulcer   . Diverticulitis   . Syncope 01/2015  . Anxiety   . GERD (gastroesophageal reflux disease)   . History of hiatal hernia   . Cancer (HCC)     HX OF CANCER OF UTERUS   . TIA (transient ischemic attack) 02/2015  . Hypertension   . Pancreatitis   . PAF (paroxysmal atrial fibrillation) ([place]) 03/2015    a. new onset 03/2015 in setting of intractable N/V; b. on Eliquis 5 mg bid; c. CHADSVASc 4 (DM, TIA x 2, female)  . Intussusception intestine ([place]) 05/2015  . Stroke [facility])     with minimal left sided weakness  . Allergy   . Cirrhosis of liver not due to alcohol ([place]) 2016  . Gastroparesis   . Sick sinus syndrome [facility])     Patient Active Problem List   Diagnosis Date Noted  . Epigastric pain   . Abdominal pain, epigastric   . Personal history of surgery to heart and great vessels, presenting hazards to health   . Gastritis   . Foreign body in  stomach   . Abnormal findings-gastrointestinal tract   . Diarrhea   . PAF (paroxysmal atrial fibrillation) ([place])   . Congestive dilated cardiomyopathy ([place])   . Generalized abdominal pain   . Nausea & vomiting   . NSTEMI (non-ST elevated myocardial infarction) ([place]) 01/11/2016  . Tachyarrhythmia 01/10/2016  . Intractable nausea and vomiting 01/10/2016  . Tachy-brady syndrome ([place]) 12/28/2015  . Abdominal pain 12/27/2015  . Colitis 12/16/2015  . Pneumonia 11/14/2015  . Narcotic withdrawal ([place]) 11/11/2015  . Diabetes mellitus type 2, insulin dependent ([place]) 11/11/2015  . Orthostatic hypotension   . H/O TIA (transient ischemic attack) and stroke   . Left-sided weakness 10/04/2015  . Expressive aphasia 10/04/2015  . Ileus ([place]) 08/01/2015  . Ascites   . Cryptogenic cirrhosis ([place]) 07/10/2015  . Paroxysmal atrial fibrillation ([place]) 07/10/2015  . C. difficile colitis 07/10/2015  . GI (gastrointestinal bleed) 07/06/2015  . Malnutrition of moderate degree 07/04/2015  . Symptomatic bradycardia 05/27/2015  . Intussusception intestine ([place])   . Hypothyroidism due to amiodarone   . Abdominal pain, chronic, epigastric   . Chronic anemia 05/19/2015  . Thrombocytopenia ([place]) 05/19/2015  . Prolonged QT interval   . Hypomagnesemia   . Narcotic abuse   . Uncontrolled type 2 diabetes mellitus with gastroparesis ([place])   . Syncope due to orthostatic hypotension 05/18/2015  . Hypokalemia 04/06/2015  .  Hyperlipidemia with target LDL less than 100 04/06/2015  . CVA (cerebral infarction) 02/15/2015  . Essential hypertension 01/12/2015  . Chronically on opiate therapy 01/12/2015  . Gastroparesis 01/12/2015  . DEPRESSION/ANXIETY 06/27/2007  . MYOFASCIAL PAIN SYNDROME 06/27/2007  . Chronic pain syndrome 03/28/2007  . GERD 03/27/2007  . DIVERTICULOSIS, COLON 03/27/2007  . LUMBAR DISC DISPLACEMENT 03/27/2007  . PROTEINURIA 03/27/2007  . UTERINE CANCER, HX OF 03/27/2007    Past Surgical  History  Procedure Laterality Date  . Hernia repair    . Abdominal hysterectomy    . Cholecystectomy    . Esophagogastroduodenoscopy N/A 04/04/2015    Procedure: ESOPHAGOGASTRODUODENOSCOPY (EGD);  Surgeon: Hulen Luster, MD;  Location: [facility] ENDOSCOPY;  Service: Endoscopy;  Laterality: N/A;  . Cardiac catheterization N/A 01/12/2016    Procedure: Left Heart Cath and Coronary Angiography;  Surgeon: Wellington Hampshire, MD;  Location: [facility] LAB;  Service: Cardiovascular;  Laterality: N/A;  . Esophagogastroduodenoscopy (egd) with propofol N/A 01/18/2016    Procedure: ESOPHAGOGASTRODUODENOSCOPY (EGD) WITH PROPOFOL;  Surgeon: Lucilla Lame, MD;  Location: [facility] ENDOSCOPY;  Service: Endoscopy;  Laterality: N/A;  . Flexible sigmoidoscopy N/A 01/18/2016    Procedure: FLEXIBLE SIGMOIDOSCOPY;  Surgeon: Lucilla Lame, MD;  Location: [facility] ENDOSCOPY;  Service: Endoscopy;  Laterality: N/A;    No current outpatient prescriptions on file.  Allergies Hydrocodone; Aspirin; Erythromycin; Prednisone; Rosiglitazone maleate; Codeine sulfate; and Tetanus-diphtheria toxoids td  Family History  Problem Relation Age of Onset  . Hypertension Mother   . CAD Sister   . Heart attack Sister     Deceased 12-Nov-2014  . CAD Brother     Social History Social History  Substance Use Topics  . Smoking status: Never Smoker   . Smokeless tobacco: Never Used  . Alcohol Use: No    Review of Systems Constitutional: No fever/chills Eyes: No visual changes. ENT: No sore throat. Cardiovascular: Denies chest pain. Respiratory: Denies shortness of breath. Gastrointestinal: No diarrhea.  No constipation. Genitourinary: Negative for dysuria. Musculoskeletal: Negative for back pain. Skin: Negative for rash. Neurological: Negative for headaches, focal weakness or numbness.  10-point ROS otherwise negative.  ____________________________________________   PHYSICAL EXAM:  VITAL SIGNS: ED Triage Vitals  Enc Vitals Group      BP 02/03/16 1913 166/71 mmHg     Pulse Rate 02/03/16 1913 125     Resp 02/03/16 1913 20     Temp 02/03/16 1913 98.8 F (37.1 C)     Temp Source 02/03/16 1913 Oral     SpO2 02/03/16 1913 100 %     Weight 02/03/16 1913 140 lb (63.504 kg)     Height 02/03/16 1913 5' 3"  (1.6 m)     Head Cir --      Peak Flow --      Pain Score 02/03/16 1914 10     Pain Loc --      Pain Edu? --      Excl. in [place]? --    Constitutional: Alert and oriented. Nauseated, sitting upright spitting up into emesis bag. No acute distress. Eyes: Conjunctivae are normal. PERRL. EOMI. Head: Atraumatic. Nose: No congestion/rhinnorhea. Mouth/Throat: Mucous membranes are moist.  Oropharynx non-erythematous. Neck: No stridor.   Cardiovascular: Normal rate, regular rhythm. Grossly normal heart sounds.  Good peripheral circulation. Respiratory: Normal respiratory effort.  No retractions. Lungs CTAB. Gastrointestinal: Soft athough slightly distended epigastrium with focal tenderness in epigastrium and left upper quadrant. No tenderness in the lower quadrants or right upper quadrant. There is no rebound  guarding or frank evidence of acute surgical abdomen or peritonitis.  Musculoskeletal: No lower extremity tenderness nor edema.  No joint effusions. Neurologic:  Normal speech and language. No gross focal neurologic deficits are appreciated. No gait instability. Skin:  Skin is warm, dry and intact. No rash noted. Psychiatric: Mood and affect are normal. Speech and behavior are normal.  ____________________________________________   LABS (all labs ordered are listed, but only abnormal results are displayed)  Labs Reviewed  COMPREHENSIVE METABOLIC PANEL - Abnormal; Notable for the following:    Potassium 3.4 (*)    CO2 18 (*)    Glucose, Bld 261 (*)    Total Bilirubin 1.3 (*)    All other components within normal limits  CBC - Abnormal; Notable for the following:    Hemoglobin 10.3 (*)    HCT 32.0 (*)    RDW 17.1 (*)     Platelets 138 (*)    All other components within normal limits  LIPASE, BLOOD  URINALYSIS COMPLETEWITH MICROSCOPIC ([facility] ONLY)   ____________________________________________  EKG   ____________________________________________  RADIOLOGY  DG Abd 2 Views (Final result) Result time: 02/04/16 04:08:37   Final result by Rad Results In Interface (02/04/16 04:08:37)   Narrative:   CLINICAL DATA: Vomiting and diarrhea for 2 days. Upper abdominal pain.  EXAM: ABDOMEN - 2 VIEW  COMPARISON: Radiograph 01/25/2016, CT 01/22/2016  FINDINGS: Air-fluid level in the stomach. Air within normal caliber small bowel loops with scattered air-fluid levels. Small volume of stool in the colon. No free air. Right nephrolithiasis again seen.  IMPRESSION: Air-fluid levels within the stomach and small bowel suggesting enteritis, no bowel dilatation to suggest obstruction. No free air.   Electronically Signed By: Jeb Levering M.D. On: 02/04/2016 04:08    ____________________________________________   PROCEDURES  Procedure(s) performed: None  Critical Care performed: No  ____________________________________________   INITIAL IMPRESSION / ASSESSMENT AND PLAN / ED COURSE  Pertinent labs & imaging results that were available during my care of the patient were reviewed by me and considered in my medical decision making (see chart for details).  Patient presents with upper abdominal pain, reports a many time diagnosed and treated his gastroparesis. Review of patient records does indicate multiple admissions for abdominal pain, intractable nausea and vomiting. She appears to have symptoms consistent with previous presentations including epigastric pain, vomiting, and difficulty controlling symptomatology. Her antiemetics are somewhat limited due to history of prolonged QT, but she does show some mild improvement with Ativan and with an initial dose of Zofran which was  discontinued. Pain is improved after several doses of morphine, however continues to recur.  No fever, no leukocytosis, no evidence of peritonitis or signs of acute perforation. Given the patient's previous history of multiple presentations we will avoid CT at this time, but admitted for pain control, antiemetics, and further evaluation and serial care under the hospitalist service. She is agreeable with this plan. No acute cardiac or pulmonary symptoms. ____________________________________________   FINAL CLINICAL IMPRESSION(S) / ED DIAGNOSES  Final diagnoses:  Vomiting  Intractable vomiting with nausea, vomiting of unspecified type  Gastroparesis      Delman Kitten, MD 02/04/16 [phone]

## 2016-02-04 NOTE — ED Notes (Signed)
Pt requesting additional pain and more nausea medication. md notified.

## 2016-02-04 NOTE — ED Notes (Signed)
Attempted IV without success X2 - charge nurse notified - another RN attempted without success with U/S - pt is requesting "neck IV" - will notify doctor

## 2016-02-04 NOTE — ED Notes (Signed)
Pt reports vomiting (unable to count number of times but in room just seems to be gagging at this time) and diarrhea (10 loose stools in 24 hours) X2 days with upper abd pain - no other symptoms

## 2016-02-04 NOTE — ED Notes (Signed)
Pt requesting additional pain medication. Pt sleepy, md notified of pt request. No new orders received. Pt updated on admission process. Pt verbalizes understanding. ivf continue to infuse slowing through thumb iv in right hand.

## 2016-02-04 NOTE — ED Notes (Signed)
Dr. Jacqualine Code notified regarding pt's request for additional pain medication. No new orders received.

## 2016-02-04 NOTE — ED Notes (Addendum)
Pt requesting more nausea medication, will notify md.

## 2016-02-04 NOTE — ED Notes (Signed)
Report form teressa, rn.

## 2016-02-04 NOTE — ED Notes (Signed)
Pt requesting additional pain medication. Will notifiy md.

## 2016-02-04 NOTE — H&P (Signed)
[facility] [place] NAME: [name]    MR#:  [id number]  DATE OF BIRTH:  Nov 23, 1955  DATE OF ADMISSION:  02/04/2016  PRIMARY CARE PHYSICIAN: Maryland Pink, MD   REQUESTING/REFERRING PHYSICIAN:   CHIEF COMPLAINT:   Chief Complaint  Patient presents with  . Emesis  . Diarrhea    HISTORY OF PRESENT ILLNESS: [name]  is a 60 y.o. female with a known history of Diabetes mellitus, hypothyroidism, degenerative disc disease, anxiety disorder, hypertension, proximal atrial fibrillation presented to the emergency room with abdominal pain. The abdominal pain is generalized and aching in nature 10 out of 10. Sometimes the pain is sharp in nature. The abdominal pain was present for the last few days. Patient has nausea and vomiting. Vomitus has food and water. No history of recent travel or sick contacts at home. Patient was worked up in the emergency room which showed enteritis. No history of any hematemesis hemoptysis. No history of any rectal bleed. Patient has history of chronic pain. Patient's last bowel movement was this morning.  PAST MEDICAL HISTORY:   Past Medical History  Diagnosis Date  . Type 1 diabetes ([place])     on levemir  . Hypothyroid   . Degenerative disk disease   . Stomach ulcer   . Diverticulitis   . Syncope 01/2015  . Anxiety   . GERD (gastroesophageal reflux disease)   . History of hiatal hernia   . Cancer (H[facility])     HX OF CANCER OF UTERUS   . TIA (transient ischemic attack) 02/2015  . Hypertension   . Pancreatitis   . PAF (paroxysmal atrial fibrillation) ([place]) 03/2015    a. new onset 03/2015 in setting of intractable N/V; b. on Eliquis 5 mg bid; c. CHADSVASc 4 (DM, TIA x 2, female)  . Intussusception intestine ([place]) 05/2015  . Stroke [facility])     with minimal left sided weakness  . Allergy   . Cirrhosis of liver not due to alcohol ([place]) 2014-11-22  . Gastroparesis   . Sick sinus syndrome ([place])     PAST  SURGICAL HISTORY: Past Surgical History  Procedure Laterality Date  . Hernia repair    . Abdominal hysterectomy    . Cholecystectomy    . Esophagogastroduodenoscopy N/A 04/04/2015    Procedure: ESOPHAGOGASTRODUODENOSCOPY (EGD);  Surgeon: Hulen Luster, MD;  Location: [facility] ENDOSCOPY;  Service: Endoscopy;  Laterality: N/A;  . Cardiac catheterization N/A 01/12/2016    Procedure: Left Heart Cath and Coronary Angiography;  Surgeon: Wellington Hampshire, MD;  Location: [place] CV LAB;  Service: Cardiovascular;  Laterality: N/A;  . Esophagogastroduodenoscopy (egd) with propofol N/A 01/18/2016    Procedure: ESOPHAGOGASTRODUODENOSCOPY (EGD) WITH PROPOFOL;  Surgeon: Lucilla Lame, MD;  Location: [facility] ENDOSCOPY;  Service: Endoscopy;  Laterality: N/A;  . Flexible sigmoidoscopy N/A 01/18/2016    Procedure: FLEXIBLE SIGMOIDOSCOPY;  Surgeon: Lucilla Lame, MD;  Location: [facility] ENDOSCOPY;  Service: Endoscopy;  Laterality: N/A;    SOCIAL HISTORY:  Social History  Substance Use Topics  . Smoking status: Never Smoker   . Smokeless tobacco: Never Used  . Alcohol Use: No    FAMILY HISTORY:  Family History  Problem Relation Age of Onset  . Hypertension Mother   . CAD Sister   . Heart attack Sister     Deceased 11-22-14  . CAD Brother     DRUG ALLERGIES:  Allergies  Allergen Reactions  . Hydrocodone Other (See Comments)  Pt states that this medication caused cirrhosis of the liver.    . Aspirin   . Erythromycin Other (See Comments)    Reaction:  Fever   . Prednisone Other (See Comments)    Reaction:  Unknown   . Rosiglitazone Maleate Swelling  . Codeine Sulfate Rash  . Tetanus-Diphtheria Toxoids Td Rash and Other (See Comments)    Reaction:  Fever     REVIEW OF SYSTEMS:   CONSTITUTIONAL: No fever, has weakness.  EYES: No blurred or double vision.  EARS, NOSE, AND THROAT: No tinnitus or ear pain.  RESPIRATORY: No cough, shortness of breath, wheezing or hemoptysis.  CARDIOVASCULAR: No chest pain,  orthopnea, edema.  GASTROINTESTINAL: Has nausea, vomiting, and abdominal pain. No diarrhea. GENITOURINARY: No dysuria, hematuria.  ENDOCRINE: No polyuria, nocturia,  HEMATOLOGY: No anemia, easy bruising or bleeding SKIN: No rash or lesion. MUSCULOSKELETAL: No joint pain or arthritis.   NEUROLOGIC: No tingling, numbness, weakness.  PSYCHIATRY: No anxiety or depression.   MEDICATIONS AT HOME:  Prior to Admission medications   Medication Sig Start Date End Date Taking? Authorizing Provider  acidophilus (RISAQUAD) CAPS capsule Take 2 capsules by mouth daily. 01/25/16  Yes Sital Mody, MD  albuterol (PROVENTIL HFA;VENTOLIN HFA) 108 (90 Base) MCG/ACT inhaler Inhale 2 puffs into the lungs every 6 (six) hours as needed for wheezing or shortness of breath. 12/19/15  Yes Gladstone Lighter, MD  amLODipine (NORVASC) 5 MG tablet Take 5 mg by mouth daily.   Yes Historical Provider, MD  apixaban (ELIQUIS) 5 MG TABS tablet Take 1 tablet (5 mg total) by mouth 2 (two) times daily. 04/07/15  Yes Bettey Costa, MD  atorvastatin (LIPITOR) 40 MG tablet Take 40 mg by mouth at bedtime.   Yes Historical Provider, MD  bismuth subsalicylate (PEPTO BISMOL) 262 MG/15ML suspension Take 30 mLs by mouth every 4 (four) hours as needed for diarrhea or loose stools. 01/25/16  Yes Bettey Costa, MD  budesonide-formoterol (SYMBICORT) 160-4.5 MCG/ACT inhaler Inhale 2 puffs into the lungs 2 (two) times daily. 12/19/15  Yes Gladstone Lighter, MD  dicyclomine (BENTYL) 10 MG capsule Take 10 mg by mouth 4 (four) times daily -  before meals and at bedtime.    Yes Historical Provider, MD  insulin aspart (NOVOLOG) 100 UNIT/ML injection Inject into the skin 3 (three) times daily with meals as needed for high blood sugar. Pt uses as needed per sliding scale.   Yes Historical Provider, MD  insulin detemir (LEVEMIR) 100 UNIT/ML injection Inject 0.1 mLs (10 Units total) into the skin at bedtime. 01/25/16  Yes Bettey Costa, MD  levothyroxine (SYNTHROID,  LEVOTHROID) 75 MCG tablet Take 1 tablet (75 mcg total) by mouth daily before breakfast. 10/08/15  Yes Loleta Chance, MD  metoprolol tartrate (LOPRESSOR) 25 MG tablet Take 25 mg by mouth 2 (two) times daily.    Yes Historical Provider, MD  morphine (MS CONTIN) 15 MG 12 hr tablet Take 1 tablet (15 mg total) by mouth every 8 (eight) hours. 10/21/15  Yes Lance Bosch, MD  oxyCODONE (OXY IR/ROXICODONE) 5 MG immediate release tablet Take 5-10 mg by mouth every 6 (six) hours as needed for severe pain.   Yes Historical Provider, MD  pantoprazole (PROTONIX) 40 MG tablet Take 40 mg by mouth 2 (two) times daily before a meal.   Yes Historical Provider, MD  rifaximin (XIFAXAN) 550 MG TABS tablet Take 1 tablet (550 mg total) by mouth 2 (two) times daily. 07/11/15  Yes Theodoro Grist, MD  tiZANidine (ZANAFLEX) 4  MG tablet Take 4 mg by mouth 3 (three) times daily as needed for muscle spasms.    Yes Historical Provider, MD      PHYSICAL EXAMINATION:   VITAL SIGNS: Blood pressure 177/86, pulse 107, temperature 98.8 F (37.1 C), temperature source Oral, resp. rate 16, height 5' 3"  (1.6 m), weight 63.504 kg (140 lb), SpO2 100 %.  GENERAL:  60 y.o.-year-old patient lying in the bed with no acute distress.  EYES: Pupils equal, round, reactive to light and accommodation. No scleral icterus. Extraocular muscles intact.  HEENT: Head atraumatic, normocephalic. Oropharynx dry and nasopharynx clear.  NECK:  Supple, no jugular venous distention. No thyroid enlargement, no tenderness.  LUNGS: Normal breath sounds bilaterally, no wheezing, rales,rhonchi or crepitation. No use of accessory muscles of respiration.  CARDIOVASCULAR: S1, S2 normal. No murmurs, rubs, or gallops.  ABDOMEN: Soft, tenderness around umbilicus, nondistended. Bowel sounds present. No organomegaly or mass.  EXTREMITIES: No pedal edema, cyanosis, or clubbing.  NEUROLOGIC: Cranial nerves II through XII are intact. Muscle strength 5/5 in all extremities.  Sensation intact. Gait not checked.  PSYCHIATRIC: The patient is alert and oriented x 3.  SKIN: No obvious rash, lesion, or ulcer.   LABORATORY PANEL:   CBC  Recent Labs Lab 01/28/16 2114 02/04/16 0209  WBC 3.7 6.7  HGB 8.3* 10.3*  HCT 25.9* 32.0*  PLT 115* 138*  MCV 85.0 84.0  MCH 27.4 27.1  MCHC 32.2 32.2  RDW 17.0* 17.1*  LYMPHSABS 0.7*  --   MONOABS 0.3  --   EOSABS 0.1  --   BASOSABS 0.0  --    ------------------------------------------------------------------------------------------------------------------  Chemistries   Recent Labs Lab 01/28/16 2114 02/04/16 0209  NA 136 139  K 4.0 3.4*  CL 109 108  CO2 22 18*  GLUCOSE 267* 261*  BUN 11 12  CREATININE 0.71 0.81  CALCIUM 8.4* 8.9  AST  --  37  ALT  --  28  ALKPHOS  --  106  BILITOT  --  1.3*   ------------------------------------------------------------------------------------------------------------------ estimated creatinine clearance is 64.6 mL/min (by C-G formula based on Cr of 0.81). ------------------------------------------------------------------------------------------------------------------ No results for input(s): TSH, T4TOTAL, T3FREE, THYROIDAB in the last 72 hours.  Invalid input(s): FREET3   Coagulation profile No results for input(s): INR, PROTIME in the last 168 hours. ------------------------------------------------------------------------------------------------------------------- No results for input(s): DDIMER in the last 72 hours. -------------------------------------------------------------------------------------------------------------------  Cardiac Enzymes  Recent Labs Lab 01/28/16 2114 01/29/16 0103  TROPONINI <0.03 <0.03   ------------------------------------------------------------------------------------------------------------------ Invalid input(s):  POCBNP  ---------------------------------------------------------------------------------------------------------------  Urinalysis    Component Value Date/Time   COLORURINE AMBER* 01/21/2016 1934   COLORURINE Yellow 10/08/2014 0144   APPEARANCEUR HAZY* 01/21/2016 1934   APPEARANCEUR Clear 10/08/2014 0144   LABSPEC 1.026 01/21/2016 1934   LABSPEC 1.033 10/08/2014 0144   PHURINE 5.0 01/21/2016 1934   PHURINE 6.0 10/08/2014 0144   GLUCOSEU 150* 01/21/2016 1934   GLUCOSEU >=500 10/08/2014 0144   HGBUR NEGATIVE 01/21/2016 1934   HGBUR 1+ 10/08/2014 0144   BILIRUBINUR NEGATIVE 01/21/2016 1934   BILIRUBINUR Negative 10/08/2014 0144   KETONESUR 2+* 01/21/2016 1934   KETONESUR 2+ 10/08/2014 0144   PROTEINUR 100* 01/21/2016 1934   PROTEINUR 100 mg/dL 10/08/2014 0144   UROBILINOGEN 0.2 05/27/2015 1936   NITRITE NEGATIVE 01/21/2016 1934   NITRITE Negative 10/08/2014 0144   LEUKOCYTESUR NEGATIVE 01/21/2016 1934   LEUKOCYTESUR Negative 10/08/2014 0144     RADIOLOGY: Dg Abd 2 Views  02/04/2016  CLINICAL DATA:  Vomiting and diarrhea for 2 days.  Upper abdominal pain. EXAM: ABDOMEN - 2 VIEW COMPARISON:  Radiograph 01/25/2016, CT 01/22/2016 FINDINGS: Air-fluid level in the stomach. Air within normal caliber small bowel loops with scattered air-fluid levels. Small volume of stool in the colon. No free air. Right nephrolithiasis again seen. IMPRESSION: Air-fluid levels within the stomach and small bowel suggesting enteritis, no bowel dilatation to suggest obstruction. No free air. Electronically Signed   By: Jeb Levering M.D.   On: 02/04/2016 04:08    EKG: Orders placed or performed during the hospital encounter of 02/04/16  . ED EKG  . ED EKG    IMPRESSION AND PLAN: 60 year old female patient with history of chronic pain, diabetes mellitus, anxiety disorder, GERD, hypertension presented to the emergency room with abdominal pain nausea and vomiting. Admitting diagnosis 1. Acute viral  enteritis 2. Abdominal pain 3. Intractable nausea vomiting 4. Hypokalemia Treatment plan Admit patient to medical floor IV fluid hydration Potassium supplementation Pain management DVT prophylaxis with subcutaneous Lovenox Supportive care.  All the records are reviewed and case discussed with ED provider. Management plans discussed with the patient, family and they are in agreement.  CODE STATUS:DNR Code Status History    Date Active Date Inactive Code Status Order ID Comments User Context   01/21/2016 10:24 PM 01/25/2016  5:02 PM DNR [id number]  Lance Coon, MD Inpatient   01/15/2016  1:39 PM 01/19/2016  3:29 PM DNR [id number]  Gladstone Lighter, MD ED   01/10/2016 10:00 PM 01/13/2016  5:24 PM DNR [id number]  Harrie Foreman, MD Inpatient   12/27/2015 11:02 PM 01/01/2016  6:17 PM DNR [id number]  Max Sane, MD Inpatient   12/16/2015  4:54 PM 12/19/2015  4:52 PM DNR [id number]  Loletha Grayer, MD ED   11/11/2015 11:36 AM 11/16/2015  2:05 PM Full Code [id number]  Samella Parr, NP Inpatient   10/15/2015  1:23 AM 10/16/2015  6:17 PM Full Code [id number]  Lance Coon, MD Inpatient   10/04/2015  8:19 PM 10/08/2015  6:34 PM Full Code [id number]  Bethena Roys, MD Inpatient   08/15/2015  1:05 AM 08/16/2015  8:04 PM Full Code [id number]  Harrie Foreman, MD Inpatient   08/01/2015  1:25 AM 08/06/2015  5:41 PM Full Code [id number]  Harrie Foreman, MD Inpatient   07/04/2015  2:53 AM 07/11/2015  6:33 PM Full Code [id number]  Lytle Butte, MD ED   05/27/2015 10:48 PM 05/31/2015  2:37 PM Full Code [id number]  Rise Patience, MD Inpatient   05/19/2015  1:14 AM 05/22/2015  4:28 PM Full Code [id number]  Rise Patience, MD Inpatient   04/05/2015  9:16 AM 04/07/2015  5:23 PM Full Code [id number]  Bettey Costa, MD Inpatient   04/03/2015 12:31 PM 04/05/2015  9:16 AM Full Code [id number]  Bettey Costa, MD Inpatient   03/11/2015  4:49 PM 03/14/2015  3:50 PM Full Code [id number]  Dustin Flock, MD Inpatient   02/15/2015  4:51 PM  02/17/2015  2:33 PM DNR [id number]  Dustin Flock, MD Inpatient   02/15/2015  4:35 PM 02/15/2015  4:51 PM Full Code [id number]  Dustin Flock, MD Inpatient   01/11/2015  2:40 AM 01/12/2015  6:11 PM Full Code [id number]  Etta Quill, DO ED    Questions for Most Recent Historical Code Status (Order [id number])    Question Answer Comment   In the event of cardiac or respiratory ARREST Do not call a "code blue"    In the event of cardiac or respiratory ARREST Do not  perform Intubation, CPR, defibrillation or ACLS    In the event of cardiac or respiratory ARREST Use medication by any route, position, wound care, and other measures to relive pain and suffering. May use oxygen, suction and manual treatment of airway obstruction as needed for comfort.        TOTAL TIME TAKING CARE OF THIS PATIENT: 50 minutes.    Saundra Shelling M.D on 02/04/2016 at 7:11 AM  Between 7am to 6pm - Pager - [phone]  After 6pm go to [email or web address] - password [email or web address] [facility]  [facility]  Office  [phone]  CC: Primary care physician; Maryland Pink, MD

## 2016-02-04 NOTE — ED Notes (Signed)
Report given to kim, rn.

## 2016-02-04 NOTE — Progress Notes (Signed)
Anticoagulation Monitoring  Patient is a 60 yo female with orders for Lovenox 40 mg subq q24h for DVT prophylaxis and Eliquis 5 mg po BID.  Patient takes Eliquis 5 mg po BID as outpatient for atrial fibrillation.  Per anticoagulation policy, will discontinue Lovenox order and proceed with Eliquis orders.  SCr/CBC are acceptable for verification.   Murrell Converse, PharmD Clinical Pharmacist 02/04/2016

## 2016-02-04 NOTE — Progress Notes (Signed)
Subjective: Patient admitted with N/V and diarrhea. Feels bad.  Objective: Vital signs in last 24 hours: Temp:  [98.6 F (37 C)-98.8 F (37.1 C)] 98.6 F (37 C) 2023-03-02 0929) Pulse Rate:  [102-125] 113 Mar 02, 2023 0929) Resp:  [12-29] 22 02-Mar-2023 0929) BP: (166-184)/(71-88) 182/88 mmHg Mar 02, 2023 0929) SpO2:  [98 %-100 %] 100 % 03/02/2023 0929) Weight:  [63.22 kg (139 lb 6 oz)-63.594 kg (140 lb 3.2 oz)] 63.22 kg (139 lb 6 oz) 2023/03/02 0940) Weight change:  Last BM Date: 2016-03-01  Intake/Output from previous day:   Intake/Output this shift:    General appearance: fatigued Head: Normocephalic, without obvious abnormality, atraumatic, orral mucosa dry Eyes: conjunctivae/corneas clear. PERRL, EOM's intact. Fundi benign. Resp: clear to auscultation bilaterally Cardio: regular rate and rhythm, S1, S2 normal, no murmur, click, rub or gallop GI: soft, non-tender; bowel sounds normal; no masses,  no organomegaly  Lab Results:  Recent Labs  Mar 01, 2016 0209  WBC 6.7  HGB 10.3*  HCT 32.0*  PLT 138*   BMET  Recent Labs  03-01-2016 0209  NA 139  K 3.4*  CL 108  CO2 18*  GLUCOSE 261*  BUN 12  CREATININE 0.81  CALCIUM 8.9    Studies/Results: Dg Abd 2 Views  Mar 01, 2016  CLINICAL DATA:  Vomiting and diarrhea for 2 days. Upper abdominal pain. EXAM: ABDOMEN - 2 VIEW COMPARISON:  Radiograph 01/25/2016, CT 01/22/2016 FINDINGS: Air-fluid level in the stomach. Air within normal caliber small bowel loops with scattered air-fluid levels. Small volume of stool in the colon. No free air. Right nephrolithiasis again seen. IMPRESSION: Air-fluid levels within the stomach and small bowel suggesting enteritis, no bowel dilatation to suggest obstruction. No free air. Electronically Signed   By: Jeb Levering M.D.   On: 2016-03-01 04:08    Medications:  Scheduled: . acidophilus  2 capsule Oral Daily  . amLODipine  5 mg Oral Daily  . apixaban  5 mg Oral BID  . atorvastatin  40 mg Oral QHS  . dicyclomine   10 mg Oral TID AC & HS  . insulin aspart  0-20 Units Subcutaneous TID WC  . insulin aspart  0-5 Units Subcutaneous QHS  . [START ON 02/05/2016] levothyroxine  75 mcg Oral QAC breakfast  . metoprolol tartrate  25 mg Oral BID  . mometasone-formoterol  2 puff Inhalation BID  . morphine      .  morphine injection  4 mg Intravenous Once  . rifaximin  550 mg Oral BID   Continuous: . 0.9 % NaCl with KCl 20 mEq / L 100 mL/hr at 03-01-16 1107    Assessment/Plan: 1. Nausea with Vomiting: Likely viral gastroenteritis. IV antiemetics. Start liquid diet when tolerates. 2. Diarrhea: Likely related to #1. Low probability of c.dif but stool sent for testing. Supportive care. 3. Dehydration: Secondary to above. IVF. 4. Hypokalemia: Secondary from loos from N/V/D. Repleat. Check Mg.  Time spent: 30 min     Baxter Hire 2016/03/01, 1:22 PM

## 2016-02-04 NOTE — Care Management Obs Status (Signed)
[place] NOTIFICATION   Patient Details  Name: [name] MRN: [id number] Date of Birth: Dec 27, 1955   Medicare Observation Status Notification Given:  Yes (nausea and vomiting)    Ival Bible, RN 02/04/2016, 8:05 AM

## 2016-02-05 DIAGNOSIS — R112 Nausea with vomiting, unspecified: Secondary | ICD-10-CM | POA: Diagnosis not present

## 2016-02-05 DIAGNOSIS — R188 Other ascites: Secondary | ICD-10-CM | POA: Diagnosis not present

## 2016-02-05 DIAGNOSIS — A084 Viral intestinal infection, unspecified: Secondary | ICD-10-CM | POA: Diagnosis not present

## 2016-02-05 LAB — BASIC METABOLIC PANEL
Anion gap: 6 (ref 5–15)
BUN: 11 mg/dL (ref 6–20)
CALCIUM: 8.2 mg/dL — AB (ref 8.9–10.3)
CO2: 23 mmol/L (ref 22–32)
CREATININE: 0.63 mg/dL (ref 0.44–1.00)
Chloride: 108 mmol/L (ref 101–111)
GFR calc non Af Amer: >60 mL/min (ref >60)
Glucose, Bld: 204 mg/dL — ABNORMAL HIGH (ref 65–99)
Potassium: 3.6 mmol/L (ref 3.5–5.1)
SODIUM: 137 mmol/L (ref 135–145)

## 2016-02-05 LAB — GLUCOSE, CAPILLARY
GLUCOSE-CAPILLARY: 220 mg/dL — AB (ref 65–99)
GLUCOSE-CAPILLARY: 153 mg/dL — AB (ref 65–99)
Glucose-Capillary: 159 mg/dL — ABNORMAL HIGH (ref 65–99)
Glucose-Capillary: 188 mg/dL — ABNORMAL HIGH (ref 65–99)

## 2016-02-05 LAB — CBC
HCT: 28.7 % — ABNORMAL LOW (ref 35.0–47.0)
Hemoglobin: 9.3 g/dL — ABNORMAL LOW (ref 12.0–16.0)
MCH: 27.3 pg (ref 26.0–34.0)
MCHC: 32.5 g/dL (ref 32.0–36.0)
MCV: 84.2 fL (ref 80.0–100.0)
PLATELETS: 113 10*3/uL — AB (ref 150–440)
RBC: 3.42 MIL/uL — AB (ref 3.80–5.20)
RDW: 17.2 % — ABNORMAL HIGH (ref 11.5–14.5)
WBC: 4.5 10*3/uL (ref 3.6–11.0)

## 2016-02-05 NOTE — Progress Notes (Signed)
Subjective: Patient admitted with N/V and diarrhea. Able to tolerate small amounts of liquid  Objective: Vital signs in last 24 hours: Temp:  [98.5 F (36.9 C)-99.2 F (37.3 C)] 99.2 F (37.3 C) (05/28 0930) Pulse Rate:  [89-123] 119 (05/28 0930) Resp:  [17-20] 18 (05/28 0930) BP: (118-168)/(76-80) 118/76 mmHg (05/28 0930) SpO2:  [96 %-98 %] 98 % (05/28 0930) Weight change: 0.091 kg (3.2 oz) Last BM Date: 02-06-2016  Intake/Output from previous day: 2023/02/06 0701 - 05/28 0700 In: 1810 [I.V.:1810] Out: 650 [Urine:650] Intake/Output this shift:    General appearance: alert and no distress Head: Normocephalic, without obvious abnormality, atraumatic, orral mucosa moist Resp: clear to auscultation bilaterally and normal percussion bilaterally Cardio: regular rate and rhythm GI: soft, non-tender; bowel sounds normal; no masses,  no organomegaly Extremities: extremities normal, atraumatic, no cyanosis or edema  Lab Results:  Recent Labs  06-Feb-2016 0209 02/05/16 0415  WBC 6.7 4.5  HGB 10.3* 9.3*  HCT 32.0* 28.7*  PLT 138* 113*   BMET  Recent Labs  02/06/2016 0209 02/05/16 0415  NA 139 137  K 3.4* 3.6  CL 108 108  CO2 18* 23  GLUCOSE 261* 204*  BUN 12 11  CREATININE 0.81 0.63  CALCIUM 8.9 8.2*    Studies/Results: Dg Abd 2 Views  2016/02/06  CLINICAL DATA:  Vomiting and diarrhea for 2 days. Upper abdominal pain. EXAM: ABDOMEN - 2 VIEW COMPARISON:  Radiograph 01/25/2016, CT 01/22/2016 FINDINGS: Air-fluid level in the stomach. Air within normal caliber small bowel loops with scattered air-fluid levels. Small volume of stool in the colon. No free air. Right nephrolithiasis again seen. IMPRESSION: Air-fluid levels within the stomach and small bowel suggesting enteritis, no bowel dilatation to suggest obstruction. No free air. Electronically Signed   By: Jeb Levering M.D.   On: 02-06-2016 04:08    Medications:  Scheduled: . acidophilus  2 capsule Oral Daily  .  amLODipine  5 mg Oral Daily  . apixaban  5 mg Oral BID  . atorvastatin  40 mg Oral QHS  . dicyclomine  10 mg Oral TID AC & HS  . insulin aspart  0-20 Units Subcutaneous TID WC  . insulin aspart  0-5 Units Subcutaneous QHS  . levothyroxine  75 mcg Oral QAC breakfast  . metoprolol tartrate  25 mg Oral BID  . mometasone-formoterol  2 puff Inhalation BID  .  morphine injection  4 mg Intravenous Once  . rifaximin  550 mg Oral BID   Continuous: . 0.9 % NaCl with KCl 20 mEq / L 100 mL/hr at 02/05/16 0607    Assessment/Plan: 1. Nausea with Vomiting: Likely viral gastroenteritis. IV antiemetics. Mild improvement. Try to advance to soft diet. 2. Diarrhea: Likely related to #1. Resolved. C. Dif negative. 3. Dehydration: Secondary to above. IVF. 4. Hypokalemia: Secondary from loos from N/V/D. Repleated..  Time spent: 15 min     Baxter Hire 02/05/2016, 12:52 PM

## 2016-02-06 ENCOUNTER — Observation Stay: Payer: Commercial Managed Care - HMO

## 2016-02-06 DIAGNOSIS — N200 Calculus of kidney: Secondary | ICD-10-CM | POA: Diagnosis not present

## 2016-02-06 DIAGNOSIS — R112 Nausea with vomiting, unspecified: Secondary | ICD-10-CM | POA: Diagnosis not present

## 2016-02-06 DIAGNOSIS — A084 Viral intestinal infection, unspecified: Secondary | ICD-10-CM | POA: Diagnosis not present

## 2016-02-06 DIAGNOSIS — R188 Other ascites: Secondary | ICD-10-CM | POA: Diagnosis not present

## 2016-02-06 LAB — CBC
HCT: 30.6 % — ABNORMAL LOW (ref 35.0–47.0)
Hemoglobin: 9.9 g/dL — ABNORMAL LOW (ref 12.0–16.0)
MCH: 27.5 pg (ref 26.0–34.0)
MCHC: 32.3 g/dL (ref 32.0–36.0)
MCV: 85.3 fL (ref 80.0–100.0)
PLATELETS: 120 10*3/uL — AB (ref 150–440)
RBC: 3.59 MIL/uL — AB (ref 3.80–5.20)
RDW: 17.4 % — AB (ref 11.5–14.5)
WBC: 5.1 10*3/uL (ref 3.6–11.0)

## 2016-02-06 LAB — GLUCOSE, CAPILLARY
GLUCOSE-CAPILLARY: 190 mg/dL — AB (ref 65–99)
GLUCOSE-CAPILLARY: 211 mg/dL — AB (ref 65–99)
Glucose-Capillary: 241 mg/dL — ABNORMAL HIGH (ref 65–99)
Glucose-Capillary: 171 mg/dL — ABNORMAL HIGH (ref 65–99)

## 2016-02-06 LAB — CREATININE, SERUM
CREATININE: 0.72 mg/dL (ref 0.44–1.00)
GFR calc Af Amer: >60 mL/min (ref >60)

## 2016-02-06 MED ORDER — IOPAMIDOL (ISOVUE-300) INJECTION 61%
85.0000 mL | Freq: Once | INTRAVENOUS | Status: AC | PRN
  Administered 2016-02-06: 85 mL via INTRAVENOUS

## 2016-02-06 MED ORDER — GLUCERNA SHAKE PO LIQD
237.0000 mL | Freq: Three times a day (TID) | ORAL | Status: DC
  Administered 2016-02-07: 237 mL via ORAL

## 2016-02-06 MED ORDER — DIATRIZOATE MEGLUMINE & SODIUM 66-10 % PO SOLN
15.0000 mL | ORAL | Status: AC
  Administered 2016-02-06: 30 mL via ORAL
  Administered 2016-02-06: 12:00:00 15 mL via ORAL

## 2016-02-06 NOTE — Progress Notes (Signed)
Subjective: Patient admitted with N/V and diarrhea. Today complains of sharpe epigastric abd pain.  Objective: Vital signs in last 24 hours: Temp:  [98.6 F (37 C)-99.3 F (37.4 C)] 99.3 F (37.4 C) (05/29 0459) Pulse Rate:  [93-95] 93 (05/29 0459) Resp:  [17-18] 18 (05/29 0459) BP: (119-137)/(56-71) 137/71 mmHg (05/29 0459) SpO2:  [97 %] 97 % (05/29 0459) Weight change:  Last BM Date:  ("several days ago" per pt)  Intake/Output from previous day: 05/28 0701 - 05/29 0700 In: 1805.8 [P.O.:240; I.V.:1565.8] Out: 300 [Urine:300] Intake/Output this shift: Total I/O In: 240 [P.O.:240] Out: -   General appearance: alert and no distress Head: Normocephalic, without obvious abnormality, atraumatic Resp: clear to auscultation bilaterally and normal percussion bilaterally Cardio: regular rate and rhythm GI: tender in the mid epigastric area. No rebound or guarding. Extremities: extremities normal, atraumatic, no cyanosis or edema Neurologic: Grossly normal  Lab Results:  Recent Labs  02/05/16 0415 02/06/16 0558  WBC 4.5 5.1  HGB 9.3* 9.9*  HCT 28.7* 30.6*  PLT 113* 120*   BMET  Recent Labs  02/04/16 0209 02/05/16 0415 02/06/16 0558  NA 139 137  --   K 3.4* 3.6  --   CL 108 108  --   CO2 18* 23  --   GLUCOSE 261* 204*  --   BUN 12 11  --   CREATININE 0.81 0.63 0.72  CALCIUM 8.9 8.2*  --     Studies/Results: No results found.  Medications:  Scheduled: . acidophilus  2 capsule Oral Daily  . amLODipine  5 mg Oral Daily  . apixaban  5 mg Oral BID  . atorvastatin  40 mg Oral QHS  . dicyclomine  10 mg Oral TID AC & HS  . insulin aspart  0-20 Units Subcutaneous TID WC  . insulin aspart  0-5 Units Subcutaneous QHS  . levothyroxine  75 mcg Oral QAC breakfast  . metoprolol tartrate  25 mg Oral BID  . mometasone-formoterol  2 puff Inhalation BID  .  morphine injection  4 mg Intravenous Once  . rifaximin  550 mg Oral BID   Continuous: . 0.9 % NaCl with KCl 20  mEq / L 75 mL/hr at 02/05/16 1904    Assessment/Plan 1.Abdominal Pain: Mid epigastric. Patient has recent hx of colitis. Will get abdonimal CT to evaluate. 2. Nausea with Vomiting: Likely viral gastroenteritis. IV antiemetics. Mild improvement. Not tolerating diet do to abd pain. Question colitis as cause at this ppoint.  3. Diarrhea: Likely related to #1. Resolved. C. Dif negative. 3. Dehydration: Secondary to above. IVF. 5. Hypokalemia: Secondary from loos from N/V/D. Repleated..  Time spent: 35 min     Baxter Hire 02/06/2016, 10:20 AM

## 2016-02-06 NOTE — Progress Notes (Signed)
Initial Nutrition Assessment  DOCUMENTATION CODES:   Non-severe (moderate) malnutrition in context of chronic illness  INTERVENTION:  -Recommend smaller, more frequent meals; will order snacks between meals -Recommend addition of Glucerna shakes on meal trays -Pt has been educated on multiple admissions regarding diet; reviewed diet concerns with pt. Pt receptive, no questions at this time  NUTRITION DIAGNOSIS:   Malnutrition related to chronic illness as evidenced by moderate depletions of muscle mass, mild depletion of body fat, mild fluid accumulation.  GOAL:   Patient will meet greater than or equal to 90% of their needs  MONITOR:   PO intake, Supplement acceptance  REASON FOR ASSESSMENT:   Malnutrition Screening Tool    ASSESSMENT:   60 yo female admitted with N/V/D; CT abdomen pending for today. Pt with hx of gastroparesis, diverticulitis/colitis, pancreatitis, cirrhosis. Pt is also a Type I diabetic. Pt with 10 admissions in past 6 months not including this observation admission.   Pt reports eating 6-7 small meals per day at home. Pt reports appetite good up until 2 days prior to admission. Pt last admission was 01/21/16. Pt does report early satiety with abdominal fullness. Reports she is unable to eat much at one time. Pt has met clinical characteristics for moderate malnutrition in context of chronic illness on previous admissions.   Nutrition-Focused physical exam completed. Findings are mild fat depletion, mild/moderate muscle depletion, and mild edema. Pt c/o abdominal distention/swelling as well. Pt reports chronic LE swelling.    Past Medical History  Diagnosis Date  . Type 1 diabetes ([place])     on levemir  . Hypothyroid   . Degenerative disk disease   . Stomach ulcer   . Diverticulitis   . Syncope 01/2015  . Anxiety   . GERD (gastroesophageal reflux disease)   . History of hiatal hernia   . Cancer (HCC)     HX OF CANCER OF UTERUS   . TIA (transient  ischemic attack) 02/2015  . Hypertension   . Pancreatitis   . PAF (paroxysmal atrial fibrillation) ([place]) 03/2015    a. new onset 03/2015 in setting of intractable N/V; b. on Eliquis 5 mg bid; c. CHADSVASc 4 (DM, TIA x 2, female)  . Intussusception intestine ([place]) 05/2015  . Stroke [facility])     with minimal left sided weakness  . Allergy   . Cirrhosis of liver not due to alcohol ([place]) 2016  . Gastroparesis   . Sick sinus syndrome ([place])      Diet Order:  DIET SOFT Room service appropriate?: Yes; Fluid consistency:: Thin   Energy Intake: recorded po intake 100% of meals; pt reports she ate bites of eggs and a piece of toast this AM  Digestive System: pt reports nausea and abdominal pain this AM but no vomiting  Skin:  Reviewed, no issues  Last BM:  no documented BM since admission; pt reporting to RN that last BM was several days ago   Labs:   Glucose Profile:  Recent Labs  02/05/16 1653 02/05/16 2103 02/06/16 0730  GLUCAP 159* 188* 190*    Meds: ss novolog  Height:   Ht Readings from Last 1 Encounters:  02/04/16 _0  (1.6 m)    Weight:   Wt Readings from Last 1 Encounters:  02/04/16 139 lb 6 oz (63.22 kg)    Wt Readings from Last 10 Encounters:  02/04/16 139 lb 6 oz (63.22 kg)  01/21/16 151 lb 12.8 oz (68.856 kg)  01/15/16 136 lb 4.8 oz (61.825 kg)  01/13/16 136 lb 14.5 oz (62.1 kg)  01/10/16 131 lb (59.421 kg)  12/28/15 148 lb 6.4 oz (67.314 kg)  12/17/15 136 lb 14.4 oz (62.097 kg)  11/16/15 136 lb 4.8 oz (61.825 kg)  10/21/15 133 lb (60.328 kg)  10/15/15 131 lb (59.421 kg)    BMI:  Body mass index is 24.7 kg/(m^2).  Estimated Nutritional Needs:   Kcal:  2500-3704 kcals  Protein:  76-88 g  Fluid:  >/= 1.5 L  EDUCATION NEEDS:   Education needs addressed  Kerman Passey MS, [place], LDN [phone] Pager  [phone] Weekend/On-Call Pager

## 2016-02-06 NOTE — Progress Notes (Signed)
CT scan shows enteritis and mild ascities.  1. Enteritis: Continue light diet. No further nausea or vomiting. 2. Mils Ascities: Not taught but may be adding to abdominal discomfort. Consisder US guided paracentisis in am.

## 2016-02-07 ENCOUNTER — Other Ambulatory Visit: Payer: Self-pay | Admitting: *Deleted

## 2016-02-07 DIAGNOSIS — A084 Viral intestinal infection, unspecified: Secondary | ICD-10-CM | POA: Diagnosis not present

## 2016-02-07 DIAGNOSIS — R112 Nausea with vomiting, unspecified: Secondary | ICD-10-CM | POA: Diagnosis not present

## 2016-02-07 DIAGNOSIS — R188 Other ascites: Secondary | ICD-10-CM | POA: Diagnosis not present

## 2016-02-07 LAB — GLUCOSE, CAPILLARY: Glucose-Capillary: 219 mg/dL — ABNORMAL HIGH (ref 65–99)

## 2016-02-07 MED ORDER — FUROSEMIDE 20 MG PO TABS: 20.0000 mg | ORAL_TABLET | Freq: Every day | ORAL | Status: DC

## 2016-02-07 MED ORDER — SPIRONOLACTONE 25 MG PO TABS: 25.0000 mg | ORAL_TABLET | Freq: Every day | ORAL | Status: DC

## 2016-02-07 NOTE — Patient Outreach (Signed)
[facility]) Care Management [facility] care Coordination outreach 02/07/2016  MINIE ROADCAP 12/24/1955 [id number]  Noted current hospital admission for Ms. Hollopeter, who is currently active with [facility] Management with multiple attempts of being followed in the community by [place], all of which have been unsuccessful, as [facility] CM are unable to successfully contact patient by phone or leave her a voice mail with every hospital discharge .   Ms.Rabadi was readmitted to the hospital on Feb 04, 2016 (This is patient's ninth (9th) IP hospital visit since January 2017).  [place] will follow her progress while she is hospitalized. [facility] Liaison has been notified of Ms. Rau's admission via secure messaging through EMR.    [name], RN, BSN, [employer] [facility] Care Management  [phone]

## 2016-02-07 NOTE — Care Management (Signed)
Discharge to home today per Dr. Darvin Neighbours. Friends/family will transport. Shelbie Ammons RN MSN CCM Care Management [phone]

## 2016-02-07 NOTE — Patient Outreach (Addendum)
[place] [facility]) Care Management [facility] CM, Telephone Outreach, Care Coordination call 02/07/2016  [name] 28-Jun-1956 [id number]  Successful return call to voice mail msg received from Nickie Retort, Environmental education officer for Dr. Kary Kos, PCP of [name], currently followed by [facility] for multiple IP hospital admissions.  All previous attempts by [place] to contact patient have been unsuccessful with each hospital discharge . Ms.Gordin was most recently re-admitted to the hospital on Feb 04, 2016 (This is patient's ninth (9th) IP hospital visit since January 2017)  Today, Levada Dy reported that discharging IP Hospitalist provider had a made follow up appointment for patient prior to her discharge home from the hospital today.  Post-discharge PCP visit has been scheduled for February 09, 2016 at 09:45 am; unfortunately, [facility] CM is unable to attend appointment for joint visit, due to prior patient scheduling conflict.  Collaborated with Levada Dy to share [facility] services with patient, as well as multiple unsuccessful attempts of [place] CM to contact patient previously.  Plan: [place] Telephone outreach attempt #1 to patient, for transition of care after IP hospital discharge today, planned for tomorrow. [place] will continue collaboration efforts with PCP practice going forward.  [name], RN, BSN, [employer] [facility] Care Management  [phone]

## 2016-02-07 NOTE — Progress Notes (Signed)
Reviewed d/c paperwork w/pt and husband.  Went over f/u appt and new medicines.  Encouraged her to follow cardiac diet.  Had IV infilitrate in her R hand and it was swollen.  Suggested she keep it propped up.

## 2016-02-07 NOTE — Discharge Instructions (Signed)
°  DIET:  °Cardiac diet ° °DISCHARGE CONDITION:  °Stable ° °ACTIVITY:  °Activity as tolerated ° °OXYGEN:  °Home Oxygen: No. °  °Oxygen Delivery: room air ° °DISCHARGE LOCATION:  °home  ° °If you experience worsening of your admission symptoms, develop shortness of breath, life threatening emergency, suicidal or homicidal thoughts you must seek medical attention immediately by calling 911 or calling your MD immediately  if symptoms less severe. ° °You Must read complete instructions/literature along with all the possible adverse reactions/side effects for all the Medicines you take and that have been prescribed to you. Take any new Medicines after you have completely understood and accpet all the possible adverse reactions/side effects.  ° °Please note ° °You were cared for by a hospitalist during your hospital stay. If you have any questions about your discharge medications or the care you received while you were in the hospital after you are discharged, you can call the unit and asked to speak with the hospitalist on call if the hospitalist that took care of you is not available. Once you are discharged, your primary care physician will handle any further medical issues. Please note that NO REFILLS for any discharge medications will be authorized once you are discharged, as it is imperative that you return to your primary care physician (or establish a relationship with a primary care physician if you do not have one) for your aftercare needs so that they can reassess your need for medications and monitor your lab values. ° ° ° ° ° °

## 2016-02-07 NOTE — Progress Notes (Addendum)
Inpatient Diabetes Program Recommendations  AACE/ADA: New Consensus Statement on Inpatient Glycemic Control (2015)  Target Ranges:  Prepandial:   less than 140 mg/dL      Peak postprandial:   less than 180 mg/dL (1-2 hours)      Critically ill patients:  140 - 180 mg/dL   Results for [name], [name] (MRN [id number]) as of 02/07/2016 10:18  Ref. Range 02/06/2016 07:30 02/06/2016 11:52 02/06/2016 16:44 02/06/2016 21:05  Glucose-Capillary Latest Ref Range: 65-99 mg/dL 190 (H) 241 (H) 171 (H) 211 (H)   Results for [name], [name] (MRN [id number]) as of 02/07/2016 10:18  Ref. Range 02/07/2016 08:07  Glucose-Capillary Latest Ref Range: 65-99 mg/dL 219 (H)    Admit with: Viral Enteritis  History: DM  Home DM Meds: Levemir 10 units QHS       Novolog SSI tid  Current Insulin Orders: Novolog Resistant Correction Scale/ SSI (0-20 units) TID AC + HS       MD- Note patient now eating 100% of meals per nursing documentation.  Fasting glucose levels elevated X 2 days.  Patient takes Levemir at home.  Please consider starting patient's home dose of Levemir- Levemir 10 units QHS     --Will follow patient during hospitalization--  Wyn Quaker RN, MSN, CDE Diabetes Coordinator Inpatient Glycemic Control Team Team Pager: [phone] (8a-5p)

## 2016-02-07 NOTE — Care Management Important Message (Signed)
Important Message  Patient Details  Name: [name] MRN: [id number] Date of Birth: 1955/09/11   Medicare Important Message Given:  Yes  IM documented on wrong patient.  Juliann Pulse Kyerra Vargo 02/07/16 @10 :08 am   Genette Huertas A Eldo Umanzor 02/07/2016, 10:08 AM

## 2016-02-07 NOTE — Care Management Important Message (Signed)
Important Message  Patient Details  Name: [name] MRN: [id number] Date of Birth: 31-Jul-1956   Medicare Important Message Given:  Yes    Juliann Pulse A Arloa Prak 02/07/2016, 10:07 AM

## 2016-02-08 ENCOUNTER — Other Ambulatory Visit: Payer: Self-pay | Admitting: *Deleted

## 2016-02-08 NOTE — Discharge Summary (Signed)
[place] [facility] [place] NAME: [name]    MR#:  [id number]  DATE OF BIRTH:  06-27-1956  DATE OF ADMISSION:  02/04/2016 ADMITTING PHYSICIAN: Baxter Hire, MD  DATE OF DISCHARGE: 02/07/2016 11:39 AM  PRIMARY CARE PHYSICIAN: [place] Pink, MD   ADMISSION DIAGNOSIS:  Gastroparesis [K31.84] Vomiting [R11.10] Intractable vomiting with nausea, vomiting of unspecified type [R11.10]  DISCHARGE DIAGNOSIS:  Active Problems:   Abdominal pain   SECONDARY DIAGNOSIS:   Past Medical History  Diagnosis Date  . Type 1 diabetes ([place])     on levemir  . Hypothyroid   . Degenerative disk disease   . Stomach ulcer   . Diverticulitis   . Syncope 01/2015  . Anxiety   . GERD (gastroesophageal reflux disease)   . History of hiatal hernia   . Cancer (HCC)     HX OF CANCER OF UTERUS   . TIA (transient ischemic attack) 02/2015  . Hypertension   . Pancreatitis   . PAF (paroxysmal atrial fibrillation) ([place]) 03/2015    a. new onset 03/2015 in setting of intractable N/V; b. on Eliquis 5 mg bid; c. CHADSVASc 4 (DM, TIA x 2, female)  . Intussusception intestine ([place]) 05/2015  . Stroke [facility])     with minimal left sided weakness  . Allergy   . Cirrhosis of liver not due to alcohol ([place]) 2016  . Gastroparesis   . Sick sinus syndrome ([place])      ADMITTING HISTORY   HISTORY OF PRESENT ILLNESS: [name] is a 60 y.o. female with a known history of Diabetes mellitus, hypothyroidism, degenerative disc disease, anxiety disorder, hypertension, proximal atrial fibrillation presented to the emergency room with abdominal pain. The abdominal pain is generalized and aching in nature 10 out of 10. Sometimes the pain is sharp in nature. The abdominal pain was present for the last few days. Patient has nausea and vomiting. Vomitus has food and water. No history of recent travel or sick contacts at home. Patient was worked up in the emergency room which showed  enteritis. No history of any hematemesis hemoptysis. No history of any rectal bleed. Patient has history of chronic pain. Patient's last bowel movement was this morning.  HOSPITAL COURSE:   * Chronic abdominal pain Patient was admitted for nausea, vomiting and diarrhea. She also had abdominal pain but this was unchanged from her chronic pain. She was treated symptomatically for possible viral gastroenteritis. CT scan of the abdomen was done which showed mild ascites with some antritis. She was afebrile with normal WBC. No antibiotics needed. By the day of discharge patient is tolerating regular food. Mild abdominal pain unchanged from her chronic pain. Afebrile. She is being discharged home to follow-up with her primary care physician in one week. Diarrhea resolved.  Patient did have dehydration and hypokalemia due to diarrhea which has resolved with IV fluids and potassium supplementation.  CONSULTS OBTAINED:  Treatment Team:  Baxter Hire, MD  DRUG ALLERGIES:   Allergies  Allergen Reactions  . Hydrocodone Other (See Comments)    Pt states that this medication caused cirrhosis of the liver.    . Aspirin   . Erythromycin Other (See Comments)    Reaction:  Fever   . Prednisone Other (See Comments)    Reaction:  Unknown   . Rosiglitazone Maleate Swelling  . Codeine Sulfate Rash  . Tetanus-Diphtheria Toxoids Td Rash and Other (See Comments)    Reaction:  Fever  DISCHARGE MEDICATIONS:   Discharge Medication List as of 02/07/2016 11:14 AM    START taking these medications   Details  furosemide (LASIX) 20 MG tablet Take 1 tablet (20 mg total) by mouth daily., Starting 02/07/2016, Until Discontinued, Normal    spironolactone (ALDACTONE) 25 MG tablet Take 1 tablet (25 mg total) by mouth daily., Starting 02/07/2016, Until Discontinued, Normal      CONTINUE these medications which have NOT CHANGED   Details  acidophilus (RISAQUAD) CAPS capsule Take 2 capsules by mouth daily.,  Starting 01/25/2016, Until Discontinued, Normal    albuterol (PROVENTIL HFA;VENTOLIN HFA) 108 (90 Base) MCG/ACT inhaler Inhale 2 puffs into the lungs every 6 (six) hours as needed for wheezing or shortness of breath., Starting 12/19/2015, Until Discontinued, Normal    apixaban (ELIQUIS) 5 MG TABS tablet Take 1 tablet (5 mg total) by mouth 2 (two) times daily., Starting 04/07/2015, Until Discontinued, Normal    atorvastatin (LIPITOR) 40 MG tablet Take 40 mg by mouth at bedtime., Until Discontinued, Historical Med    bismuth subsalicylate (PEPTO BISMOL) 262 MG/15ML suspension Take 30 mLs by mouth every 4 (four) hours as needed for diarrhea or loose stools., Starting 01/25/2016, Until Discontinued, Normal    budesonide-formoterol (SYMBICORT) 160-4.5 MCG/ACT inhaler Inhale 2 puffs into the lungs 2 (two) times daily., Starting 12/19/2015, Until Discontinued, Normal    dicyclomine (BENTYL) 10 MG capsule Take 10 mg by mouth 4 (four) times daily -  before meals and at bedtime. , Until Discontinued, Historical Med    insulin aspart (NOVOLOG) 100 UNIT/ML injection Inject into the skin 3 (three) times daily with meals as needed for high blood sugar. Pt uses as needed per sliding scale., Until Discontinued, Historical Med    insulin detemir (LEVEMIR) 100 UNIT/ML injection Inject 0.1 mLs (10 Units total) into the skin at bedtime., Starting 01/25/2016, Until Discontinued, Normal    levothyroxine (SYNTHROID, LEVOTHROID) 75 MCG tablet Take 1 tablet (75 mcg total) by mouth daily before breakfast., Starting 10/08/2015, Until Discontinued, Normal    metoprolol tartrate (LOPRESSOR) 25 MG tablet Take 25 mg by mouth 2 (two) times daily. , Until Discontinued, Historical Med    morphine (MS CONTIN) 15 MG 12 hr tablet Take 1 tablet (15 mg total) by mouth every 8 (eight) hours., Starting 10/21/2015, Until Discontinued, Print    oxyCODONE (OXY IR/ROXICODONE) 5 MG immediate release tablet Take 5-10 mg by mouth every 6 (six)  hours as needed for severe pain., Until Discontinued, Historical Med    pantoprazole (PROTONIX) 40 MG tablet Take 40 mg by mouth 2 (two) times daily before a meal., Until Discontinued, Historical Med    rifaximin (XIFAXAN) 550 MG TABS tablet Take 1 tablet (550 mg total) by mouth 2 (two) times daily., Starting 07/11/2015, Until Discontinued, Normal    tiZANidine (ZANAFLEX) 4 MG tablet Take 4 mg by mouth 3 (three) times daily as needed for muscle spasms. , Until Discontinued, Historical Med      STOP taking these medications     amLODipine (NORVASC) 5 MG tablet         Today   VITAL SIGNS:  Blood pressure 113/62, pulse 88, temperature 99.8 F (37.7 C), temperature source Oral, resp. rate 16, height 5' 3"  (1.6 m), weight 63.22 kg (139 lb 6 oz), SpO2 98 %.  I/O:  No intake or output data in the 24 hours ending 02/08/16 1652  PHYSICAL EXAMINATION:  Physical Exam  GENERAL:  60 y.o.-year-old patient lying in the bed with no acute  distress.  LUNGS: Normal breath sounds bilaterally, no wheezing, rales,rhonchi or crepitation. No use of accessory muscles of respiration.  CARDIOVASCULAR: S1, S2 normal. No murmurs, rubs, or gallops.  ABDOMEN: Soft, non-tender, non-distended. Bowel sounds present. No organomegaly or mass.  NEUROLOGIC: Moves all 4 extremities. PSYCHIATRIC: The patient is alert and oriented x 3.   DATA REVIEW:   CBC  Recent Labs Lab 02/06/16 0558  WBC 5.1  HGB 9.9*  HCT 30.6*  PLT 120*    Chemistries   Recent Labs Lab 02/04/16 0209 02/05/16 0415 02/06/16 0558  NA 139 137  --   K 3.4* 3.6  --   CL 108 108  --   CO2 18* 23  --   GLUCOSE 261* 204*  --   BUN 12 11  --   CREATININE 0.81 0.63 0.72  CALCIUM 8.9 8.2*  --   AST 37  --   --   ALT 28  --   --   ALKPHOS 106  --   --   BILITOT 1.3*  --   --     Cardiac Enzymes No results for input(s): TROPONINI in the last 168 hours.  Microbiology Results  Results for orders placed or performed during the  hospital encounter of 01/21/16  Gastrointestinal Panel by PCR , Stool     Status: None   Collection Time: 01/22/16  1:49 PM  Result Value Ref Range Status   Campylobacter species NOT DETECTED NOT DETECTED Final   Plesimonas shigelloides NOT DETECTED NOT DETECTED Final   Salmonella species NOT DETECTED NOT DETECTED Final   Yersinia enterocolitica NOT DETECTED NOT DETECTED Final   Vibrio species NOT DETECTED NOT DETECTED Final   Vibrio cholerae NOT DETECTED NOT DETECTED Final   Enteroaggregative E coli (EAEC) NOT DETECTED NOT DETECTED Final   Enteropathogenic E coli (EPEC) NOT DETECTED NOT DETECTED Final   Enterotoxigenic E coli (ETEC) NOT DETECTED NOT DETECTED Final   Shiga like toxin producing E coli (STEC) NOT DETECTED NOT DETECTED Final   E. coli O157 NOT DETECTED NOT DETECTED Final   Shigella/Enteroinvasive E coli (EIEC) NOT DETECTED NOT DETECTED Final   Cryptosporidium NOT DETECTED NOT DETECTED Final   Cyclospora cayetanensis NOT DETECTED NOT DETECTED Final   Entamoeba histolytica NOT DETECTED NOT DETECTED Final   Giardia lamblia NOT DETECTED NOT DETECTED Final   Adenovirus F40/41 NOT DETECTED NOT DETECTED Final   Astrovirus NOT DETECTED NOT DETECTED Final   Norovirus GI/GII NOT DETECTED NOT DETECTED Final   Rotavirus A NOT DETECTED NOT DETECTED Final   Sapovirus (I, II, IV, and V) NOT DETECTED NOT DETECTED Final  C difficile quick scan w PCR reflex     Status: None   Collection Time: 01/22/16  1:49 PM  Result Value Ref Range Status   C Diff antigen NEGATIVE NEGATIVE Final   C Diff toxin NEGATIVE NEGATIVE Final   C Diff interpretation Negative for C. difficile  Corrected    Comment: CORRECTED ON 05/14 AT 1515: PREVIOUSLY REPORTED AS Negative for toxigenic C. difficile. Toxin gene and active toxin production not detected. May be a nontoxigenic strain of C. difficile bacteria present, lacking the ability to produce toxin.  Culture, body fluid-bottle     Status: None   Collection  Time: 01/23/16  9:58 AM  Result Value Ref Range Status   Specimen Description ABDOMEN  Final   Special Requests NONE  Final   Culture NO GROWTH 4 DAYS  Final   Report Status 01/27/2016 FINAL  Final    RADIOLOGY:  No results found.  Follow up with PCP in 1 week.  Management plans discussed with the patient, family and they are in agreement.  CODE STATUS:  Code Status History    Date Active Date Inactive Code Status Order ID Comments User Context   02/04/2016  9:39 AM 02/07/2016  2:59 PM DNR [id number]  Saundra Shelling, MD Inpatient   01/21/2016 10:24 PM 01/25/2016  5:02 PM DNR [id number]  Lance Coon, MD Inpatient   01/15/2016  1:39 PM 01/19/2016  3:29 PM DNR [id number]  Gladstone Lighter, MD ED   01/10/2016 10:00 PM 01/13/2016  5:24 PM DNR [id number]  Harrie Foreman, MD Inpatient   12/27/2015 11:02 PM 01/01/2016  6:17 PM DNR [id number]  Max Sane, MD Inpatient   12/16/2015  4:54 PM 12/19/2015  4:52 PM DNR [id number]  Loletha Grayer, MD ED   11/11/2015 11:36 AM 11/16/2015  2:05 PM Full Code [id number]  Samella Parr, NP Inpatient   10/15/2015  1:23 AM 10/16/2015  6:17 PM Full Code [id number]  Lance Coon, MD Inpatient   10/04/2015  8:19 PM 10/08/2015  6:34 PM Full Code [id number]  Bethena Roys, MD Inpatient   08/15/2015  1:05 AM 08/16/2015  8:04 PM Full Code [id number]  Harrie Foreman, MD Inpatient   08/01/2015  1:25 AM 08/06/2015  5:41 PM Full Code [id number]  Harrie Foreman, MD Inpatient   07/04/2015  2:53 AM 07/11/2015  6:33 PM Full Code [id number]  Lytle Butte, MD ED   05/27/2015 10:48 PM 05/31/2015  2:37 PM Full Code [id number]  Rise Patience, MD Inpatient   05/19/2015  1:14 AM 05/22/2015  4:28 PM Full Code [id number]  Rise Patience, MD Inpatient   04/05/2015  9:16 AM 04/07/2015  5:23 PM Full Code [id number]  Bettey Costa, MD Inpatient   04/03/2015 12:31 PM 04/05/2015  9:16 AM Full Code [id number]  Bettey Costa, MD Inpatient   03/11/2015  4:49 PM 03/14/2015  3:50 PM Full Code [id number]  Dustin Flock,  MD Inpatient   02/15/2015  4:51 PM 02/17/2015  2:33 PM DNR [id number]  Dustin Flock, MD Inpatient   02/15/2015  4:35 PM 02/15/2015  4:51 PM Full Code [id number]  Dustin Flock, MD Inpatient   01/11/2015  2:40 AM 01/12/2015  6:11 PM Full Code [id number]  Etta Quill, DO ED    Questions for Most Recent Historical Code Status (Order [id number])    Question Answer Comment   In the event of cardiac or respiratory ARREST Do not call a "code blue"    In the event of cardiac or respiratory ARREST Do not perform Intubation, CPR, defibrillation or ACLS    In the event of cardiac or respiratory ARREST Use medication by any route, position, wound care, and other measures to relive pain and suffering. May use oxygen, suction and manual treatment of airway obstruction as needed for comfort.       TOTAL TIME TAKING CARE OF THIS PATIENT ON DAY OF DISCHARGE: more than 30 minutes.   Hillary Bow R M.D on 02/08/2016 at 4:52 PM  Between 7am to 6pm - Pager - [phone]  After 6pm go to [email or web address] - password [email or web address] [facility]  Office  [phone]  CC: Primary care physician; [place] Pink, MD  Note: This dictation was prepared with Dragon dictation along with smaller phrase technology. Any transcriptional errors that result from this process are unintentional.

## 2016-02-08 NOTE — Patient Outreach (Signed)
[facility]) Care Management    [place] Telephone Outreach/ Transition of Care outreach attempt # 02/08/2016  MURRIEL HOLWERDA 10/04/1955 [id number]   Unsuccessful telephone outreach to Ms. Emylia Latella, 60 y/o female referred to [place] for multiple ED/ hospital admissions; patient was most recently admitted May 27-30, 2017, for ongoing abdominal/ epigastric pain. Outreach attempt was made last week during patient's scheduled office visit with her PCP on Wednesday, Feb 01, 2016, but patient was "no-show," to scheduled visit.  Most recent IP hospitalization marks hospital admission #9 in 2017.   Today's call attempt was answered again by automated outgoing VM msg stated, "the person you are trying to reach is not available, please try your call later," and would not allow me to leave a VM msg.    Plan:  [place] will re-attempt telephone outreach for transition of care after most recent hospital admission tomorrow.  [name], RN, BSN, [employer] [facility] Care Management  [phone]

## 2016-02-09 ENCOUNTER — Emergency Department: Payer: Commercial Managed Care - HMO

## 2016-02-09 ENCOUNTER — Other Ambulatory Visit: Payer: Self-pay | Admitting: *Deleted

## 2016-02-09 ENCOUNTER — Emergency Department
Admission: EM | Admit: 2016-02-09 | Discharge: 2016-02-10 | Disposition: A | Discharge status: Home / Self Care | Payer: Commercial Managed Care - HMO | Attending: Emergency Medicine | Admitting: Emergency Medicine

## 2016-02-09 DIAGNOSIS — R1013 Epigastric pain: Secondary | ICD-10-CM | POA: Diagnosis not present

## 2016-02-09 DIAGNOSIS — I480 Paroxysmal atrial fibrillation: Secondary | ICD-10-CM | POA: Insufficient documentation

## 2016-02-09 DIAGNOSIS — Z794 Long term (current) use of insulin: Secondary | ICD-10-CM | POA: Insufficient documentation

## 2016-02-09 DIAGNOSIS — N3090 Cystitis, unspecified without hematuria: Secondary | ICD-10-CM

## 2016-02-09 DIAGNOSIS — K3184 Gastroparesis: Secondary | ICD-10-CM | POA: Diagnosis not present

## 2016-02-09 DIAGNOSIS — I10 Essential (primary) hypertension: Secondary | ICD-10-CM | POA: Diagnosis not present

## 2016-02-09 DIAGNOSIS — I252 Old myocardial infarction: Secondary | ICD-10-CM | POA: Insufficient documentation

## 2016-02-09 DIAGNOSIS — E119 Type 2 diabetes mellitus without complications: Secondary | ICD-10-CM | POA: Insufficient documentation

## 2016-02-09 DIAGNOSIS — N3091 Cystitis, unspecified with hematuria: Secondary | ICD-10-CM | POA: Insufficient documentation

## 2016-02-09 DIAGNOSIS — K7460 Unspecified cirrhosis of liver: Secondary | ICD-10-CM | POA: Diagnosis not present

## 2016-02-09 DIAGNOSIS — E785 Hyperlipidemia, unspecified: Secondary | ICD-10-CM | POA: Insufficient documentation

## 2016-02-09 DIAGNOSIS — Z8673 Personal history of transient ischemic attack (TIA), and cerebral infarction without residual deficits: Secondary | ICD-10-CM | POA: Insufficient documentation

## 2016-02-09 DIAGNOSIS — E1043 Type 1 diabetes mellitus with diabetic autonomic (poly)neuropathy: Secondary | ICD-10-CM | POA: Diagnosis not present

## 2016-02-09 DIAGNOSIS — K2970 Gastritis, unspecified, without bleeding: Secondary | ICD-10-CM | POA: Diagnosis not present

## 2016-02-09 DIAGNOSIS — K529 Noninfective gastroenteritis and colitis, unspecified: Secondary | ICD-10-CM | POA: Diagnosis not present

## 2016-02-09 DIAGNOSIS — Z8542 Personal history of malignant neoplasm of other parts of uterus: Secondary | ICD-10-CM | POA: Diagnosis not present

## 2016-02-09 DIAGNOSIS — Z7951 Long term (current) use of inhaled steroids: Secondary | ICD-10-CM | POA: Diagnosis not present

## 2016-02-09 DIAGNOSIS — E039 Hypothyroidism, unspecified: Secondary | ICD-10-CM | POA: Diagnosis not present

## 2016-02-09 DIAGNOSIS — I495 Sick sinus syndrome: Secondary | ICD-10-CM | POA: Diagnosis not present

## 2016-02-09 DIAGNOSIS — R000 Tachycardia, unspecified: Secondary | ICD-10-CM | POA: Diagnosis not present

## 2016-02-09 DIAGNOSIS — Z79891 Long term (current) use of opiate analgesic: Secondary | ICD-10-CM | POA: Insufficient documentation

## 2016-02-09 DIAGNOSIS — N200 Calculus of kidney: Secondary | ICD-10-CM | POA: Diagnosis not present

## 2016-02-09 DIAGNOSIS — R1111 Vomiting without nausea: Secondary | ICD-10-CM | POA: Diagnosis not present

## 2016-02-09 DIAGNOSIS — K5790 Diverticulosis of intestine, part unspecified, without perforation or abscess without bleeding: Secondary | ICD-10-CM | POA: Diagnosis not present

## 2016-02-09 DIAGNOSIS — R1010 Upper abdominal pain, unspecified: Secondary | ICD-10-CM | POA: Diagnosis present

## 2016-02-09 DIAGNOSIS — Z79899 Other long term (current) drug therapy: Secondary | ICD-10-CM | POA: Diagnosis not present

## 2016-02-09 DIAGNOSIS — R319 Hematuria, unspecified: Secondary | ICD-10-CM

## 2016-02-09 LAB — URINALYSIS COMPLETE WITH MICROSCOPIC (ARMC ONLY)
Bilirubin Urine: NEGATIVE
Glucose, UA: NEGATIVE mg/dL
KETONES UR: NEGATIVE mg/dL
NITRITE: NEGATIVE
PROTEIN: 100 mg/dL — AB
Specific Gravity, Urine: 1.02 (ref 1.005–1.030)
pH: 5 (ref 5.0–8.0)

## 2016-02-09 LAB — CBC WITH DIFFERENTIAL/PLATELET
BASOS ABS: 0 10*3/uL (ref 0–0.1)
Eosinophils Absolute: 0 10*3/uL (ref 0–0.7)
HCT: 30.4 % — ABNORMAL LOW (ref 35.0–47.0)
Hemoglobin: 10.1 g/dL — ABNORMAL LOW (ref 12.0–16.0)
Lymphocytes Relative: 8 %
Lymphs Abs: 0.5 10*3/uL — ABNORMAL LOW (ref 1.0–3.6)
MCH: 27.5 pg (ref 26.0–34.0)
MCHC: 33.1 g/dL (ref 32.0–36.0)
MCV: 83.2 fL (ref 80.0–100.0)
MONO ABS: 0.3 10*3/uL (ref 0.2–0.9)
Monocytes Relative: 4 %
Neutro Abs: 5.4 10*3/uL (ref 1.4–6.5)
Neutrophils Relative %: 88 %
PLATELETS: 197 10*3/uL (ref 150–440)
RBC: 3.66 MIL/uL — ABNORMAL LOW (ref 3.80–5.20)
RDW: 17.4 % — AB (ref 11.5–14.5)
WBC: 6.2 10*3/uL (ref 3.6–11.0)

## 2016-02-09 LAB — COMPREHENSIVE METABOLIC PANEL
ALBUMIN: 3.8 g/dL (ref 3.5–5.0)
ALT: 28 U/L (ref 14–54)
AST: 50 U/L — AB (ref 15–41)
Alkaline Phosphatase: 95 U/L (ref 38–126)
Anion gap: 7 (ref 5–15)
BUN: 16 mg/dL (ref 6–20)
CALCIUM: 9.2 mg/dL (ref 8.9–10.3)
CO2: 24 mmol/L (ref 22–32)
Chloride: 107 mmol/L (ref 101–111)
Creatinine, Ser: 0.48 mg/dL (ref 0.44–1.00)
GFR calc Af Amer: >60 mL/min (ref >60)
GFR calc non Af Amer: >60 mL/min (ref >60)
GLUCOSE: 67 mg/dL (ref 65–99)
Potassium: 3.9 mmol/L (ref 3.5–5.1)
Sodium: 138 mmol/L (ref 135–145)
TOTAL PROTEIN: 6.8 g/dL (ref 6.5–8.1)
Total Bilirubin: 1.3 mg/dL — ABNORMAL HIGH (ref 0.3–1.2)

## 2016-02-09 LAB — LIPASE, BLOOD: Lipase: 17 U/L (ref 11–51)

## 2016-02-09 MED ORDER — SODIUM CHLORIDE 0.9 % IV BOLUS (SEPSIS)
1000.0000 mL | Freq: Once | INTRAVENOUS | Status: AC
  Administered 2016-02-09: 1000 mL via INTRAVENOUS

## 2016-02-09 MED ORDER — CEPHALEXIN 500 MG PO CAPS: 500.0000 mg | ORAL_CAPSULE | Freq: Three times a day (TID) | ORAL | Status: DC

## 2016-02-09 MED ORDER — DIPHENHYDRAMINE HCL 25 MG PO CAPS: 50.0000 mg | ORAL_CAPSULE | Freq: Four times a day (QID) | ORAL | Status: DC | PRN

## 2016-02-09 MED ORDER — METOCLOPRAMIDE HCL 10 MG PO TABS: 10.0000 mg | ORAL_TABLET | Freq: Three times a day (TID) | ORAL | Status: DC

## 2016-02-09 MED ORDER — PROCHLORPERAZINE EDISYLATE 5 MG/ML IJ SOLN
INTRAMUSCULAR | Status: AC
  Administered 2016-02-09: 10 mg via INTRAVENOUS
  Filled 2016-02-09: qty 2

## 2016-02-09 MED ORDER — ONDANSETRON 4 MG PO TBDP
4.0000 mg | ORAL_TABLET | Freq: Three times a day (TID) | ORAL | Status: DC | PRN
Start: 2016-02-09 — End: 2016-05-15

## 2016-02-09 MED ORDER — HYDROMORPHONE HCL 1 MG/ML IJ SOLN
1.0000 mg | Freq: Once | INTRAMUSCULAR | Status: AC
  Administered 2016-02-09: 1 mg via INTRAVENOUS
  Filled 2016-02-09: qty 1

## 2016-02-09 MED ORDER — METOCLOPRAMIDE HCL 5 MG/ML IJ SOLN
10.0000 mg | Freq: Once | INTRAMUSCULAR | Status: AC
  Administered 2016-02-09: 10 mg via INTRAVENOUS
  Filled 2016-02-09: qty 2

## 2016-02-09 MED ORDER — PROCHLORPERAZINE MALEATE 10 MG PO TABS
10.0000 mg | ORAL_TABLET | Freq: Once | ORAL | Status: DC
  Filled 2016-02-09: qty 1

## 2016-02-09 MED ORDER — PROCHLORPERAZINE EDISYLATE 5 MG/ML IJ SOLN
10.0000 mg | INTRAMUSCULAR | Status: AC
  Administered 2016-02-09: 10 mg via INTRAVENOUS

## 2016-02-09 MED ORDER — HALOPERIDOL LACTATE 5 MG/ML IJ SOLN
2.5000 mg | Freq: Once | INTRAMUSCULAR | Status: AC
Start: 2016-02-09 — End: 2016-02-09
  Administered 2016-02-09: 2.5 mg via INTRAVENOUS
  Filled 2016-02-09: qty 1

## 2016-02-09 MED ORDER — MORPHINE SULFATE (PF) 4 MG/ML IV SOLN
4.0000 mg | Freq: Once | INTRAVENOUS | Status: AC
  Administered 2016-02-09: 4 mg via INTRAVENOUS
  Filled 2016-02-09: qty 1

## 2016-02-09 MED ORDER — DEXTROSE 5 % IV SOLN
1.0000 g | Freq: Once | INTRAVENOUS | Status: AC
  Administered 2016-02-09: 1 g via INTRAVENOUS
  Filled 2016-02-09: qty 10

## 2016-02-09 NOTE — ED Notes (Signed)
Pt to ED via EMS from home c/o Chest Pain. Per EMS pt c/o N/V/D for the past 2 days, and today c/o Chest pain that is more like a pressure, radiating to back and left arm. EMS gave 4 of zofran. Pt alert and oriented, in no acute distress at this time.

## 2016-02-09 NOTE — Discharge Instructions (Signed)
Abdominal Pain, Adult Many things can cause abdominal pain. Usually, abdominal pain is not caused by a disease and will improve without treatment. It can often be observed and treated at home. Your health care provider will do a physical exam and possibly order blood tests and X-rays to help determine the seriousness of your pain. However, in many cases, more time must pass before a clear cause of the pain can be found. Before that point, your health care provider may not know if you need more testing or further treatment. HOME CARE INSTRUCTIONS Monitor your abdominal pain for any changes. The following actions may help to alleviate any discomfort you are experiencing:  Only take over-the-counter or prescription medicines as directed by your health care provider.  Do not take laxatives unless directed to do so by your health care provider.  Try a clear liquid diet (broth, tea, or water) as directed by your health care provider. Slowly move to a bland diet as tolerated. SEEK MEDICAL CARE IF:  You have unexplained abdominal pain.  You have abdominal pain associated with nausea or diarrhea.  You have pain when you urinate or have a bowel movement.  You experience abdominal pain that wakes you in the night.  You have abdominal pain that is worsened or improved by eating food.  You have abdominal pain that is worsened with eating fatty foods.  You have a fever. SEEK IMMEDIATE MEDICAL CARE IF:  Your pain does not go away within 2 hours.  You keep throwing up (vomiting).  Your pain is felt only in portions of the abdomen, such as the right side or the left lower portion of the abdomen.  You pass bloody or black tarry stools. MAKE SURE YOU:  Understand these instructions.  Will watch your condition.  Will get help right away if you are not doing well or get worse.   This information is not intended to replace advice given to you by your health care provider. Make sure you discuss  any questions you have with your health care provider.   Document Released: 06/06/2005 Document Revised: 05/18/2015 Document Reviewed: 05/06/2013 Elsevier Interactive Patient Education 2016 Elsevier Inc[employer]  Pain Without a Known Cause WHAT IS PAIN WITHOUT A KNOWN CAUSE? Pain can occur in any part of the body and can range from mild to severe. Sometimes no cause can be found for why you are having pain. Some types of pain that can occur without a known cause include:   Headache.  Back pain.  Abdominal pain.  Neck pain. HOW IS PAIN WITHOUT A KNOWN CAUSE DIAGNOSED?  Your health care provider will try to find the cause of your pain. This may include:  Physical exam.  Medical history.  Blood tests.  Urine tests.  X-rays. If no cause is found, your health care provider may diagnose you with pain without a known cause.  IS THERE TREATMENT FOR PAIN WITHOUT A CAUSE?  Treatment depends on the kind of pain you have. Your health care provider may prescribe medicines to help relieve your pain.  WHAT CAN I DO AT HOME FOR MY PAIN?   Take medicines only as directed by your health care provider.  Stop any activities that cause pain. During periods of severe pain, bed rest may help.  Try to reduce your stress with activities such as yoga or meditation. Talk to your health care provider for other stress-reducing activity recommendations.  Exercise regularly, if approved by your health care provider.  Eat a healthy  diet that includes fruits and vegetables. This may improve pain. Talk to your health care provider if you have any questions about your diet. WHAT IF MY PAIN DOES NOT GET BETTER?  If you have a painful condition and no reason can be found for the pain or the pain gets worse, it is important to follow up with your health care provider. It may be necessary to repeat tests and look further for a possible cause.    This information is not intended to replace advice given to you by your  health care provider. Make sure you discuss any questions you have with your health care provider.   Document Released: 05/22/2001 Document Revised: 09/17/2014 Document Reviewed: 01/12/2014 Elsevier Interactive Patient Education 2016 Elsevier Inc.  Hematuria, Adult Hematuria is blood in your urine. It can be caused by a bladder infection, kidney infection, prostate infection, kidney stone, or cancer of your urinary tract. Infections can usually be treated with medicine, and a kidney stone usually will pass through your urine. If neither of these is the cause of your hematuria, further workup to find out the reason may be needed. It is very important that you tell your health care provider about any blood you see in your urine, even if the blood stops without treatment or happens without causing pain. Blood in your urine that happens and then stops and then happens again can be a symptom of a very serious condition. Also, pain is not a symptom in the initial stages of many urinary cancers. HOME CARE INSTRUCTIONS   Drink lots of fluid, 3-4 quarts a day. If you have been diagnosed with an infection, cranberry juice is especially recommended, in addition to large amounts of water.  Avoid caffeine, tea, and carbonated beverages because they tend to irritate the bladder.  Avoid alcohol because it may irritate the prostate.  Take all medicines as directed by your health care provider.  If you were prescribed an antibiotic medicine, finish it all even if you start to feel better.  If you have been diagnosed with a kidney stone, follow your health care provider's instructions regarding straining your urine to catch the stone.  Empty your bladder often. Avoid holding urine for long periods of time.  After a bowel movement, women should cleanse front to back. Use each tissue only once.  Empty your bladder before and after sexual intercourse if you are a female. SEEK MEDICAL CARE IF:  You develop  back pain.  You have a fever.  You have a feeling of sickness in your stomach (nausea) or vomiting.  Your symptoms are not better in 3 days. Return sooner if you are getting worse. SEEK IMMEDIATE MEDICAL CARE IF:   You develop severe vomiting and are unable to keep the medicine down.  You develop severe back or abdominal pain despite taking your medicines.  You begin passing a large amount of blood or clots in your urine.  You feel extremely weak or faint, or you pass out. MAKE SURE YOU:   Understand these instructions.  Will watch your condition.  Will get help right away if you are not doing well or get worse.   This information is not intended to replace advice given to you by your health care provider. Make sure you discuss any questions you have with your health care provider.   Document Released: 08/27/2005 Document Revised: 09/17/2014 Document Reviewed: 04/27/2013 Elsevier Interactive Patient Education 2016 Elsevier Inc.  Urinary Tract Infection Urinary tract infections (UTIs) can  develop anywhere along your urinary tract. Your urinary tract is your body's drainage system for removing wastes and extra water. Your urinary tract includes two kidneys, two ureters, a bladder, and a urethra. Your kidneys are a pair of bean-shaped organs. Each kidney is about the size of your fist. They are located below your ribs, one on each side of your spine. CAUSES Infections are caused by microbes, which are microscopic organisms, including fungi, viruses, and bacteria. These organisms are so small that they can only be seen through a microscope. Bacteria are the microbes that most commonly cause UTIs. SYMPTOMS  Symptoms of UTIs may vary by age and gender of the patient and by the location of the infection. Symptoms in young women typically include a frequent and intense urge to urinate and a painful, burning feeling in the bladder or urethra during urination. Older women and men are more  likely to be tired, shaky, and weak and have muscle aches and abdominal pain. A fever may mean the infection is in your kidneys. Other symptoms of a kidney infection include pain in your back or sides below the ribs, nausea, and vomiting. DIAGNOSIS To diagnose a UTI, your caregiver will ask you about your symptoms. Your caregiver will also ask you to provide a urine sample. The urine sample will be tested for bacteria and white blood cells. White blood cells are made by your body to help fight infection. TREATMENT  Typically, UTIs can be treated with medication. Because most UTIs are caused by a bacterial infection, they usually can be treated with the use of antibiotics. The choice of antibiotic and length of treatment depend on your symptoms and the type of bacteria causing your infection. HOME CARE INSTRUCTIONS  If you were prescribed antibiotics, take them exactly as your caregiver instructs you. Finish the medication even if you feel better after you have only taken some of the medication.  Drink enough water and fluids to keep your urine clear or pale yellow.  Avoid caffeine, tea, and carbonated beverages. They tend to irritate your bladder.  Empty your bladder often. Avoid holding urine for long periods of time.  Empty your bladder before and after sexual intercourse.  After a bowel movement, women should cleanse from front to back. Use each tissue only once. SEEK MEDICAL CARE IF:   You have back pain.  You develop a fever.  Your symptoms do not begin to resolve within 3 days. SEEK IMMEDIATE MEDICAL CARE IF:   You have severe back pain or lower abdominal pain.  You develop chills.  You have nausea or vomiting.  You have continued burning or discomfort with urination. MAKE SURE YOU:   Understand these instructions.  Will watch your condition.  Will get help right away if you are not doing well or get worse.   This information is not intended to replace advice given to  you by your health care provider. Make sure you discuss any questions you have with your health care provider.   Document Released: 06/06/2005 Document Revised: 05/18/2015 Document Reviewed: 10/05/2011 Elsevier Interactive Patient Education [employer].

## 2016-02-09 NOTE — ED Notes (Signed)
Called lab for results

## 2016-02-09 NOTE — Patient Outreach (Signed)
[facility]) Care Management [place] Telephone Outreach, Transition of Care, Attempt #2 02/09/2016  Maramec COHICK 04/27/1956 [id number]   Unsuccessful telephone outreach to [name], 60 y/o female referred to [place] for multiple ED/ hospital admissions; patient was most recently admitted May 27-30, 2017, for ongoing abdominal/ epigastric pain. Outreach attempt was made last week during patient's scheduled office visit with her PCP on Wednesday, Feb 01, 2016, but patient was "no-show," to scheduled visit. Most recent IP hospitalization marks hospital admission #9 in 2017.   Today's call attempt was answered again by automated outgoing VM msg stated, "the person you are trying to reach is not available, please try your call later," and would not allow me to leave a VM msg.   I also reached out to [name], Environmental education officer for Dr. Kary Kos, Ms. Arons's PCP, as IP Hospitalist MD had made a follow up appointment for pt. with Dr. Kary Kos today at the time of her discharge home from the hospital on Feb 07, 2016.  I left [name] a voicemail message asking her to return my call for collaboration of care.  Plan:  [place] will re-attempt telephone outreach for transition of care after most recent hospital admission tomorrow.  Will wait to hear back from Dr. Kary Kos staff for ongoing collaboration attempts.  Rita Rack, RN, BSN, [employer] [facility] Care Management  [phone]

## 2016-02-09 NOTE — ED Notes (Signed)
Pt to CT

## 2016-02-09 NOTE — ED Notes (Signed)
Pt tolerated food and fluids without difficulty

## 2016-02-09 NOTE — ED Provider Notes (Signed)
[facility] Emergency Department Provider Note  ____________________________________________  Time seen: 3:30 PM  I have reviewed the triage vital signs and the nursing notes.   HISTORY  Chief Complaint Chest Pain    HPI [name] is a 60 y.o. female who complains of diffuse upper abdominal pain nausea and vomiting for the last 2 days. She reports a history of gastroparesis and this feels similar. She is diabetic, insulin-dependent, compliant with medications. No fevers or chills. No diarrhea. No chest pain shortness of breath. Has a history of cholecystectomy. Abdominal pain is crampy. Nonradiating. Constant and worsening. Severe in intensity.    Past Medical History  Diagnosis Date  . Type 1 diabetes ([place])     on levemir  . Hypothyroid   . Degenerative disk disease   . Stomach ulcer   . Diverticulitis   . Syncope 01/2015  . Anxiety   . GERD (gastroesophageal reflux disease)   . History of hiatal hernia   . Cancer (HCC)     HX OF CANCER OF UTERUS   . TIA (transient ischemic attack) 02/2015  . Hypertension   . Pancreatitis   . PAF (paroxysmal atrial fibrillation) ([place]) 03/2015    a. new onset 03/2015 in setting of intractable N/V; b. on Eliquis 5 mg bid; c. CHADSVASc 4 (DM, TIA x 2, female)  . Intussusception intestine ([place]) 05/2015  . Stroke [facility])     with minimal left sided weakness  . Allergy   . Cirrhosis of liver not due to alcohol ([place]) 2016  . Gastroparesis   . Sick sinus syndrome [facility])      Patient Active Problem List   Diagnosis Date Noted  . Epigastric pain   . Abdominal pain, epigastric   . Personal history of surgery to heart and great vessels, presenting hazards to health   . Gastritis   . Foreign body in stomach   . Abnormal findings-gastrointestinal tract   . Diarrhea   . PAF (paroxysmal atrial fibrillation) ([place])   . Congestive dilated cardiomyopathy ([place])   . Generalized abdominal pain   . Nausea & vomiting   .  NSTEMI (non-ST elevated myocardial infarction) ([place]) 01/11/2016  . Tachyarrhythmia 01/10/2016  . Intractable nausea and vomiting 01/10/2016  . Tachy-brady syndrome ([place]) 12/28/2015  . Abdominal pain 12/27/2015  . Colitis 12/16/2015  . Pneumonia 11/14/2015  . Narcotic withdrawal ([place]) 11/11/2015  . Diabetes mellitus type 2, insulin dependent ([place]) 11/11/2015  . Orthostatic hypotension   . H/O TIA (transient ischemic attack) and stroke   . Left-sided weakness 10/04/2015  . Expressive aphasia 10/04/2015  . Ileus ([place]) 08/01/2015  . Ascites   . Cryptogenic cirrhosis ([place]) 07/10/2015  . Paroxysmal atrial fibrillation ([place]) 07/10/2015  . C. difficile colitis 07/10/2015  . GI (gastrointestinal bleed) 07/06/2015  . Malnutrition of moderate degree 07/04/2015  . Symptomatic bradycardia 05/27/2015  . Intussusception intestine ([place])   . Hypothyroidism due to amiodarone   . Abdominal pain, chronic, epigastric   . Chronic anemia 05/19/2015  . Thrombocytopenia ([place]) 05/19/2015  . Prolonged QT interval   . Hypomagnesemia   . Narcotic abuse   . Uncontrolled type 2 diabetes mellitus with gastroparesis ([place])   . Syncope due to orthostatic hypotension 05/18/2015  . Hypokalemia 04/06/2015  . Hyperlipidemia with target LDL less than 100 04/06/2015  . CVA (cerebral infarction) 02/15/2015  . Essential hypertension 01/12/2015  . Chronically on opiate therapy 01/12/2015  . Gastroparesis 01/12/2015  . DEPRESSION/ANXIETY 06/27/2007  . MYOFASCIAL PAIN  SYNDROME 06/27/2007  . Chronic pain syndrome 03/28/2007  . GERD 03/27/2007  . DIVERTICULOSIS, COLON 03/27/2007  . LUMBAR DISC DISPLACEMENT 03/27/2007  . PROTEINURIA 03/27/2007  . UTERINE CANCER, HX OF 03/27/2007     Past Surgical History  Procedure Laterality Date  . Hernia repair    . Abdominal hysterectomy    . Cholecystectomy    . Esophagogastroduodenoscopy N/A 04/04/2015    Procedure: ESOPHAGOGASTRODUODENOSCOPY (EGD);  Surgeon: Hulen Luster,  MD;  Location[facility] END[facility]COPY;  Service: Endoscopy;  Laterality: N/A;  . Cardiac catheterization N/A 01/12/2016    Procedure: Left Heart Cath and Coronary Angiography;  Surgeon: Wellington Hampshire, MD;  Location: [place] CV LAB;  Service: Cardiovascular;  Laterality: N/A;  . Esophagogastroduodenoscopy (egd) with propofol N/A 01/18/2016    Procedure: ESOPHAGOGASTRODUODENOSCOPY (EGD) WITH PROPOFOL;  Surgeon: Lucilla Lame, MD;  Location: [facility] ENDOSCOPY;  Service: Endoscopy;  Laterality: N/A;  . Flexible sigmoidoscopy N/A 01/18/2016    Procedure: FLEXIBLE SIGMOIDOSCOPY;  Surgeon: Lucilla Lame, MD;  Location: [facility] ENDOSCOPY;  Service: Endoscopy;  Laterality: N/A;     Current Outpatient Rx  Name  Route  Sig  Dispense  Refill  . acidophilus (RISAQUAD) CAPS capsule   Oral   Take 2 capsules by mouth daily.   60 capsule   0   . albuterol (PROVENTIL HFA;VENTOLIN HFA) 108 (90 Base) MCG/ACT inhaler   Inhalation   Inhale 2 puffs into the lungs every 6 (six) hours as needed for wheezing or shortness of breath.   1 Inhaler   2   . apixaban (ELIQUIS) 5 MG TABS tablet   Oral   Take 1 tablet (5 mg total) by mouth 2 (two) times daily.   60 tablet   0   . atorvastatin (LIPITOR) 40 MG tablet   Oral   Take 40 mg by mouth at bedtime.         . bismuth subsalicylate (PEPTO BISMOL) 262 MG/15ML suspension   Oral   Take 30 mLs by mouth every 4 (four) hours as needed for diarrhea or loose stools.   360 mL   0   . budesonide-formoterol (SYMBICORT) 160-4.5 MCG/ACT inhaler   Inhalation   Inhale 2 puffs into the lungs 2 (two) times daily.   1 Inhaler   12   . dicyclomine (BENTYL) 10 MG capsule   Oral   Take 10 mg by mouth 4 (four) times daily -  before meals and at bedtime.          . diphenhydrAMINE (BENADRYL) 25 mg capsule   Oral   Take 2 capsules (50 mg total) by mouth every 6 (six) hours as needed.   60 capsule   0   . furosemide (LASIX) 20 MG tablet   Oral   Take 1 tablet (20 mg  total) by mouth daily.   30 tablet   0   . insulin aspart (NOVOLOG) 100 UNIT/ML injection   Subcutaneous   Inject into the skin 3 (three) times daily with meals as needed for high blood sugar. Pt uses as needed per sliding scale.         . insulin detemir (LEVEMIR) 100 UNIT/ML injection   Subcutaneous   Inject 0.1 mLs (10 Units total) into the skin at bedtime.   10 mL   11   . levothyroxine (SYNTHROID, LEVOTHROID) 75 MCG tablet   Oral   Take 1 tablet (75 mcg total) by mouth daily before breakfast.   30 tablet   1   .  metoCLOPramide (REGLAN) 10 MG tablet   Oral   Take 1 tablet (10 mg total) by mouth 4 (four) times daily -  before meals and at bedtime.   60 tablet   0   . metoprolol tartrate (LOPRESSOR) 25 MG tablet   Oral   Take 25 mg by mouth 2 (two) times daily.          Marland Kitchen morphine (MS CONTIN) 15 MG 12 hr tablet   Oral   Take 1 tablet (15 mg total) by mouth every 8 (eight) hours.   75 tablet   0   . ondansetron (ZOFRAN ODT) 4 MG disintegrating tablet   Oral   Take 1 tablet (4 mg total) by mouth every 8 (eight) hours as needed for nausea or vomiting.   20 tablet   0   . oxyCODONE (OXY IR/ROXICODONE) 5 MG immediate release tablet   Oral   Take 5-10 mg by mouth every 6 (six) hours as needed for severe pain.         . pantoprazole (PROTONIX) 40 MG tablet   Oral   Take 40 mg by mouth 2 (two) times daily before a meal.         . rifaximin (XIFAXAN) 550 MG TABS tablet   Oral   Take 1 tablet (550 mg total) by mouth 2 (two) times daily.   60 tablet   6   . spironolactone (ALDACTONE) 25 MG tablet   Oral   Take 1 tablet (25 mg total) by mouth daily.   30 tablet   0   . tiZANidine (ZANAFLEX) 4 MG tablet   Oral   Take 4 mg by mouth 3 (three) times daily as needed for muscle spasms.             Allergies Hydrocodone; Aspirin; Erythromycin; Prednisone; Rosiglitazone maleate; Codeine sulfate; and Tetanus-diphtheria toxoids td   Family History   Problem Relation Age of Onset  . Hypertension Mother   . CAD Sister   . Heart attack Sister     Deceased 11/14/14  . CAD Brother     Social History Social History  Substance Use Topics  . Smoking status: Never Smoker   . Smokeless tobacco: Never Used  . Alcohol Use: No    Review of Systems  Constitutional:   No fever or chills.  Eyes:   No vision changes.  ENT:   No sore throat. No rhinorrhea. Cardiovascular:   No chest pain. Respiratory:   No dyspnea or cough. Gastrointestinal:   Oz abdominal pain with vomiting. No diarrhea. No hematemesis or black stool.  Genitourinary:   Negative for dysuria or difficulty urinating. Musculoskeletal:   Negative for focal pain or swelling Neurological:   Negative for headaches 10-point ROS otherwise negative.  ____________________________________________   PHYSICAL EXAM:  VITAL SIGNS: ED Triage Vitals  Enc Vitals Group     BP 02/09/16 1536 174/75 mmHg     Pulse Rate 02/09/16 1536 115     Resp 02/09/16 1536 18     Temp 02/09/16 1536 98 F (36.7 C)     Temp Source 02/09/16 1536 Oral     SpO2 02/09/16 1536 99 %     Weight 02/09/16 1536 130 lb (58.968 kg)     Height 02/09/16 1536 5' 3"  (1.6 m)     Head Cir --      Peak Flow --      Pain Score 02/09/16 1537 10     Pain Loc --  Pain Edu? --      Excl. in [place]? --     Vital signs reviewed, nursing assessments reviewed.   Constitutional:   Alert and oriented. Well appearing and in no distress. Eyes:   No scleral icterus. No conjunctival pallor. PERRL. EOMI.  No nystagmus. ENT   Head:   Normocephalic and atraumatic.   Nose:   No congestion/rhinnorhea. No septal hematoma   Mouth/Throat:   Dry mucous members, no pharyngeal erythema. No peritonsillar mass.    Neck:   No stridor. No SubQ emphysema. No meningismus. Hematological/Lymphatic/Immunilogical:   No cervical lymphadenopathy. Cardiovascular:   Tachycardia heart rate 120. Symmetric bilateral radial and DP  pulses.  No murmurs.  Respiratory:   Normal respiratory effort without tachypnea nor retractions. Breath sounds are clear and equal bilaterally. No wheezes/rales/rhonchi. Gastrointestinal:   Soft with diffuse upper abdominal tenderness.. Non distended. There is no CVA tenderness.  No rebound, rigidity, or guarding. Genitourinary:   deferred Musculoskeletal:   Nontender with normal range of motion in all extremities. No joint effusions.  No lower extremity tenderness.  No edema. Neurologic:   Normal speech and language.  CN 2-10 normal. Motor grossly intact. No gross focal neurologic deficits are appreciated.  Skin:    Skin is warm, dry and intact. No rash noted.  No petechiae, purpura, or bullae.  ____________________________________________    LABS (pertinent positives/negatives) (all labs ordered are listed, but only abnormal results are displayed) Labs Reviewed  COMPREHENSIVE METABOLIC PANEL - Abnormal; Notable for the following:    AST 50 (*)    Total Bilirubin 1.3 (*)    All other components within normal limits  CBC WITH DIFFERENTIAL/PLATELET - Abnormal; Notable for the following:    RBC 3.66 (*)    Hemoglobin 10.1 (*)    HCT 30.4 (*)    RDW 17.4 (*)    Lymphs Abs 0.5 (*)    All other components within normal limits  URINALYSIS COMPLETEWITH MICROSCOPIC ([facility] ONLY) - Abnormal; Notable for the following:    Color, Urine AMBER (*)    APPearance HAZY (*)    Hgb urine dipstick 3+ (*)    Protein, ur 100 (*)    Leukocytes, UA TRACE (*)    Bacteria, UA MANY (*)    Squamous Epithelial / LPF 0-5 (*)    All other components within normal limits  URINE CULTURE  LIPASE, BLOOD   ____________________________________________   EKG  Interpreted by me Sinus tachycardia rate 1:15, normal axis and intervals. Poor R-wave progression in anterior precordial leads. Normal ST segments. Inferior Q waves. T wave inversions in V5 and V6 which are unchanged compared to previous.. QTC is  normal, 400 ms.  ____________________________________________    RADIOLOGY  CT abdomen and pelvis unremarkable, does show a few punctate kidney stones  ____________________________________________   PROCEDURES   ____________________________________________   INITIAL IMPRESSION / ASSESSMENT AND PLAN / ED COURSE  Pertinent labs & imaging results that were available during my care of the patient were reviewed by me and considered in my medical decision making (see chart for details).  Patient presents with upper abdominal pain and tachycardia. Has some epigastric tenderness. This appears to be a chronic issue for her. She states it feels like her usual gastroparesis. EKG showed a normal QT interval so proceeded giving her antiemetics. She did tolerate oral intake afterward. CT scan was unremarkable except for some kidney stones which and explain her hematuria. Urine culture sent. We'll also treat her for urinary tract infection.  Follow-up with primary care. Holding the patient is septic at this time, and looking back in the electronic medical record, she is very frequently tachycardic in this range that she is today. This appears to be a chronic issue for her. Labs otherwise unremarkable.     ____________________________________________   FINAL CLINICAL IMPRESSION(S) / ED DIAGNOSES  Final diagnoses:  Abdominal pain, epigastric       Portions of this note were generated with dragon dictation software. Dictation errors may occur despite best attempts at proofreading.   Carrie Mew, MD 02/09/16 [phone]

## 2016-02-10 ENCOUNTER — Other Ambulatory Visit: Payer: Self-pay | Admitting: *Deleted

## 2016-02-10 NOTE — Patient Outreach (Signed)
[facility]) Care Management [place] Telephone Outreach, Care Coordination 02/10/2016  KAGAN HIETPAS 01/02/1956 [id number]  Received returned call from Nickie Retort, Practice Manager for Dr. Kary Kos, who is PCP for [name], 60 y/o female, who has had multiple IP hospital admissions (9 since January 2017) and ED visits (9 resulting in IP admission, 2 resulting in discharge home since January 2017).  Each hospital/ ED visit has been for patient's ongoing complaints of abdominal pain, nausea/ vomiting.  With each IP admission/ ED visit, diagnostic testing has not revealed a physiological cause of [name]'s ongoing GI symptoms.  Levada Dy confirmed that Ms. Ashenfelter was again a "no-show" for the appointment that had been arranged for her at the time of patient's discharge from her most recent hospital admission on Feb 07, 2016.  Appointment was scheduled for February 09, 2016 at 9:45 am (2 days after patient's discharge from the hospital).    Levada Dy stated that Dr. Kary Kos was considering discharging [name] from their practice, as she has consistently not shown up for her appointments to their practice.    Levada Dy stated that [name] has either cancelled or not shown up for each visit she has been scheduled for.  The only appointment [name] has attended with their practice was the  initial office visit with Dr. Kary Kos to establish care in December 2016.  Levada Dy further stated that [name] has also not attended specialists appointments that have been arranged for her by Dr. Barbarann Ehlers practice.  Levada Dy stated that a total of 14 office visits to PCP/ GI specialists have been missed by patient since she became an established patient with Dr. Kary Kos in December 2017.  Plan: Levada Dy will continue to keep [facility] CM updated on patient status with their practice.  [name], RN, BSN, [employer] [facility] Care Management  [phone]

## 2016-02-10 NOTE — Patient Outreach (Addendum)
[place] [facility]) Care Management [place], Telephone outreach, Transition of Care Attempt #3 02/10/2016  ABBIGAYLE TOOLE Aug 23, 1956 [id number]  Unsuccessful telephone outreach to Ms. Shaquella Stamant, 60 y/o female referred to [facility] for multiple ED/ hospital admissions; patient was most recently admitted May 27-30, 2017, for ongoing abdominal/ epigastric pain. Outreach attempt was made last week during patient's scheduled office visit with her PCP on Wednesday, Feb 01, 2016, but patient was "no-show," to visit. Most recent IP hospitalization marks hospital admission #9 since January 2017.   After her Feb 07, 2016 IP hospital discharge, noted pt. had ED visit 02/09/16 for ongoing abdominal pain, intractable N/V; patient was discharged home on antibiotics and anti-emetics.  (See previous care coordination note from 02/10/16 regarding collaboration with Nickie Retort, Practice Manager of Dr. Barbarann Ehlers practice regarding February 09, 2016 PCP office visit which she was again a "no-show").   Today's third call attempt to patient for transition of care was answered again by automated outgoing VM msg stated, "the person you are trying to reach is not available, please try your call later," and would not allow me to leave a VM msg.   Plan: I wil wait to hear back from Dr. Kary Kos staff for ongoing collaboration attempts.   [name], RN, BSN, [employer] [facility] Care Management  [phone]

## 2016-02-12 LAB — URINE CULTURE: Culture: >=100000 — AB

## 2016-02-13 NOTE — Progress Notes (Signed)
ED Antimicrobial Stewardship Positive Culture Follow Up   [name] is an 60 y.o. female who presented to [facility] on 02/09/2016 with a chief complaint of  Chief Complaint  Patient presents with  . Chest Pain    Recent Results (from the past 720 hour(s))  Blood culture (routine x 2)     Status: None   Collection Time: 01/15/16 11:06 AM  Result Value Ref Range Status   Specimen Description BLOOD RIGHT THUMB  Final   Special Requests BOTTLES DRAWN AEROBIC AND ANAEROBIC  1CC  Final   Culture NO GROWTH 5 DAYS  Final   Report Status 01/20/2016 FINAL  Final  Blood culture (routine x 2)     Status: None   Collection Time: 01/15/16 11:06 AM  Result Value Ref Range Status   Specimen Description BLOOD LEFT ARM  Final   Special Requests BOTTLES DRAWN AEROBIC AND ANAEROBIC  1CC  Final   Culture NO GROWTH 5 DAYS  Final   Report Status 01/20/2016 FINAL  Final  Gastrointestinal Panel by PCR , Stool     Status: None   Collection Time: 01/16/16  7:37 AM  Result Value Ref Range Status   Campylobacter species NOT DETECTED NOT DETECTED Final   Plesimonas shigelloides NOT DETECTED NOT DETECTED Final   Salmonella species NOT DETECTED NOT DETECTED Final   Yersinia enterocolitica NOT DETECTED NOT DETECTED Final   Vibrio species NOT DETECTED NOT DETECTED Final   Vibrio cholerae NOT DETECTED NOT DETECTED Final   Enteroaggregative E coli (EAEC) NOT DETECTED NOT DETECTED Final   Enteropathogenic E coli (EPEC) NOT DETECTED NOT DETECTED Final   Enterotoxigenic E coli (ETEC) NOT DETECTED NOT DETECTED Final   Shiga like toxin producing E coli (STEC) NOT DETECTED NOT DETECTED Final   E. coli O157 NOT DETECTED NOT DETECTED Final   Shigella/Enteroinvasive E coli (EIEC) NOT DETECTED NOT DETECTED Final   Cryptosporidium NOT DETECTED NOT DETECTED Final   Cyclospora cayetanensis NOT DETECTED NOT DETECTED Final   Entamoeba histolytica NOT DETECTED NOT DETECTED Final   Giardia lamblia NOT DETECTED NOT  DETECTED Final   Adenovirus F40/41 NOT DETECTED NOT DETECTED Final   Astrovirus NOT DETECTED NOT DETECTED Final   Norovirus GI/GII NOT DETECTED NOT DETECTED Final   Rotavirus A NOT DETECTED NOT DETECTED Final   Sapovirus (I, II, IV, and V) NOT DETECTED NOT DETECTED Final  C difficile quick scan w PCR reflex     Status: None   Collection Time: 01/16/16  7:37 AM  Result Value Ref Range Status   C Diff antigen NEGATIVE NEGATIVE Final   C Diff toxin NEGATIVE NEGATIVE Final   C Diff interpretation Negative for C. difficile  Final  Gastrointestinal Panel by PCR , Stool     Status: None   Collection Time: 01/22/16  1:49 PM  Result Value Ref Range Status   Campylobacter species NOT DETECTED NOT DETECTED Final   Plesimonas shigelloides NOT DETECTED NOT DETECTED Final   Salmonella species NOT DETECTED NOT DETECTED Final   Yersinia enterocolitica NOT DETECTED NOT DETECTED Final   Vibrio species NOT DETECTED NOT DETECTED Final   Vibrio cholerae NOT DETECTED NOT DETECTED Final   Enteroaggregative E coli (EAEC) NOT DETECTED NOT DETECTED Final   Enteropathogenic E coli (EPEC) NOT DETECTED NOT DETECTED Final   Enterotoxigenic E coli (ETEC) NOT DETECTED NOT DETECTED Final   Shiga like toxin producing E coli (STEC) NOT DETECTED NOT DETECTED Final   E. coli O157 NOT  DETECTED NOT DETECTED Final   Shigella/Enteroinvasive E coli (EIEC) NOT DETECTED NOT DETECTED Final   Cryptosporidium NOT DETECTED NOT DETECTED Final   Cyclospora cayetanensis NOT DETECTED NOT DETECTED Final   Entamoeba histolytica NOT DETECTED NOT DETECTED Final   Giardia lamblia NOT DETECTED NOT DETECTED Final   Adenovirus F40/41 NOT DETECTED NOT DETECTED Final   Astrovirus NOT DETECTED NOT DETECTED Final   Norovirus GI/GII NOT DETECTED NOT DETECTED Final   Rotavirus A NOT DETECTED NOT DETECTED Final   Sapovirus (I, II, IV, and V) NOT DETECTED NOT DETECTED Final  C difficile quick scan w PCR reflex     Status: None   Collection Time:  01/22/16  1:49 PM  Result Value Ref Range Status   C Diff antigen NEGATIVE NEGATIVE Final   C Diff toxin NEGATIVE NEGATIVE Final   C Diff interpretation Negative for C. difficile  Corrected    Comment: CORRECTED ON 05/14 AT 1515: PREVIOUSLY REPORTED AS Negative for toxigenic C. difficile. Toxin gene and active toxin production not detected. May be a nontoxigenic strain of C. difficile bacteria present, lacking the ability to produce toxin.  Culture, body fluid-bottle     Status: None   Collection Time: 01/23/16  9:58 AM  Result Value Ref Range Status   Specimen Description ABDOMEN  Final   Special Requests NONE  Final   Culture NO GROWTH 4 DAYS  Final   Report Status 01/27/2016 FINAL  Final  Urine culture     Status: Abnormal   Collection Time: 02/09/16  6:21 PM  Result Value Ref Range Status   Specimen Description URINE, CLEAN CATCH  Final   Special Requests NONE  Final   Culture (A)  Final    >=100,000 COLONIES/mL VANCOMYCIN RESISTANT ENTEROCOCCUS   Report Status 02/12/2016 FINAL  Final   Organism ID, Bacteria VANCOMYCIN RESISTANT ENTEROCOCCUS (A)  Final      Susceptibility   Vancomycin resistant enterococcus - MIC*    AMPICILLIN <=2 SENSITIVE Sensitive     LEVOFLOXACIN >=8 RESISTANT Resistant     NITROFURANTOIN 64 INTERMEDIATE Intermediate     VANCOMYCIN >=32 RESISTANT Resistant     LINEZOLID 2 SENSITIVE Sensitive     * >=100,000 COLONIES/mL VANCOMYCIN RESISTANT ENTEROCOCCUS    [x]  Treated with cephalexin, organism resistant to prescribed antimicrobial []  Patient discharged originally without antimicrobial agent and treatment is now indicated  New antibiotic prescription: augmentin 844m PO BID x 5d  ED Provider: GNance Pear Spoke with patient, called in prescription to [facility]  JRexene Edison PharmD Clinical Pharmacist  02/13/2016 5:18 PM

## 2016-02-14 ENCOUNTER — Encounter: Payer: Self-pay | Admitting: *Deleted

## 2016-02-28 ENCOUNTER — Encounter: Payer: Self-pay | Admitting: *Deleted

## 2016-03-13 ENCOUNTER — Emergency Department: Payer: Commercial Managed Care - HMO

## 2016-03-13 ENCOUNTER — Inpatient Hospital Stay
Admission: EM | Admit: 2016-03-13 | Discharge: 2016-03-16 | DRG: 392 | Disposition: A | Discharge status: Home Health | Payer: Commercial Managed Care - HMO | Attending: Internal Medicine | Admitting: Internal Medicine

## 2016-03-13 DIAGNOSIS — K3184 Gastroparesis: Secondary | ICD-10-CM | POA: Diagnosis not present

## 2016-03-13 DIAGNOSIS — I252 Old myocardial infarction: Secondary | ICD-10-CM

## 2016-03-13 DIAGNOSIS — E1065 Type 1 diabetes mellitus with hyperglycemia: Secondary | ICD-10-CM | POA: Diagnosis present

## 2016-03-13 DIAGNOSIS — K219 Gastro-esophageal reflux disease without esophagitis: Secondary | ICD-10-CM | POA: Diagnosis present

## 2016-03-13 DIAGNOSIS — E785 Hyperlipidemia, unspecified: Secondary | ICD-10-CM | POA: Diagnosis present

## 2016-03-13 DIAGNOSIS — E039 Hypothyroidism, unspecified: Secondary | ICD-10-CM | POA: Diagnosis not present

## 2016-03-13 DIAGNOSIS — E032 Hypothyroidism due to medicaments and other exogenous substances: Secondary | ICD-10-CM | POA: Diagnosis present

## 2016-03-13 DIAGNOSIS — K529 Noninfective gastroenteritis and colitis, unspecified: Secondary | ICD-10-CM | POA: Diagnosis not present

## 2016-03-13 DIAGNOSIS — E1043 Type 1 diabetes mellitus with diabetic autonomic (poly)neuropathy: Secondary | ICD-10-CM | POA: Diagnosis not present

## 2016-03-13 DIAGNOSIS — Z888 Allergy status to other drugs, medicaments and biological substances status: Secondary | ICD-10-CM | POA: Diagnosis not present

## 2016-03-13 DIAGNOSIS — IMO0002 Reserved for concepts with insufficient information to code with codable children: Secondary | ICD-10-CM

## 2016-03-13 DIAGNOSIS — I480 Paroxysmal atrial fibrillation: Secondary | ICD-10-CM | POA: Diagnosis not present

## 2016-03-13 DIAGNOSIS — E1143 Type 2 diabetes mellitus with diabetic autonomic (poly)neuropathy: Secondary | ICD-10-CM | POA: Diagnosis not present

## 2016-03-13 DIAGNOSIS — T462X5A Adverse effect of other antidysrhythmic drugs, initial encounter: Secondary | ICD-10-CM | POA: Diagnosis present

## 2016-03-13 DIAGNOSIS — F419 Anxiety disorder, unspecified: Secondary | ICD-10-CM | POA: Diagnosis present

## 2016-03-13 DIAGNOSIS — G8929 Other chronic pain: Secondary | ICD-10-CM | POA: Diagnosis present

## 2016-03-13 DIAGNOSIS — Z886 Allergy status to analgesic agent status: Secondary | ICD-10-CM | POA: Diagnosis not present

## 2016-03-13 DIAGNOSIS — R188 Other ascites: Secondary | ICD-10-CM

## 2016-03-13 DIAGNOSIS — Z8542 Personal history of malignant neoplasm of other parts of uterus: Secondary | ICD-10-CM | POA: Diagnosis not present

## 2016-03-13 DIAGNOSIS — K7460 Unspecified cirrhosis of liver: Secondary | ICD-10-CM | POA: Diagnosis not present

## 2016-03-13 DIAGNOSIS — I10 Essential (primary) hypertension: Secondary | ICD-10-CM | POA: Diagnosis not present

## 2016-03-13 DIAGNOSIS — K7469 Other cirrhosis of liver: Secondary | ICD-10-CM | POA: Diagnosis not present

## 2016-03-13 DIAGNOSIS — E119 Type 2 diabetes mellitus without complications: Secondary | ICD-10-CM

## 2016-03-13 DIAGNOSIS — R112 Nausea with vomiting, unspecified: Secondary | ICD-10-CM | POA: Diagnosis not present

## 2016-03-13 DIAGNOSIS — E876 Hypokalemia: Secondary | ICD-10-CM | POA: Diagnosis not present

## 2016-03-13 DIAGNOSIS — Z794 Long term (current) use of insulin: Secondary | ICD-10-CM | POA: Diagnosis not present

## 2016-03-13 DIAGNOSIS — Z8673 Personal history of transient ischemic attack (TIA), and cerebral infarction without residual deficits: Secondary | ICD-10-CM | POA: Diagnosis not present

## 2016-03-13 DIAGNOSIS — I4581 Long QT syndrome: Secondary | ICD-10-CM | POA: Diagnosis not present

## 2016-03-13 DIAGNOSIS — Z887 Allergy status to serum and vaccine status: Secondary | ICD-10-CM | POA: Diagnosis not present

## 2016-03-13 DIAGNOSIS — R1084 Generalized abdominal pain: Secondary | ICD-10-CM

## 2016-03-13 DIAGNOSIS — Z881 Allergy status to other antibiotic agents status: Secondary | ICD-10-CM | POA: Diagnosis not present

## 2016-03-13 DIAGNOSIS — A047 Enterocolitis due to Clostridium difficile: Secondary | ICD-10-CM | POA: Diagnosis not present

## 2016-03-13 DIAGNOSIS — K6389 Other specified diseases of intestine: Secondary | ICD-10-CM | POA: Diagnosis not present

## 2016-03-13 DIAGNOSIS — Z66 Do not resuscitate: Secondary | ICD-10-CM | POA: Diagnosis present

## 2016-03-13 DIAGNOSIS — R1111 Vomiting without nausea: Secondary | ICD-10-CM | POA: Diagnosis not present

## 2016-03-13 DIAGNOSIS — Z7901 Long term (current) use of anticoagulants: Secondary | ICD-10-CM

## 2016-03-13 DIAGNOSIS — Z885 Allergy status to narcotic agent status: Secondary | ICD-10-CM | POA: Diagnosis not present

## 2016-03-13 DIAGNOSIS — R1013 Epigastric pain: Secondary | ICD-10-CM | POA: Diagnosis not present

## 2016-03-13 DIAGNOSIS — I4891 Unspecified atrial fibrillation: Secondary | ICD-10-CM | POA: Diagnosis present

## 2016-03-13 DIAGNOSIS — E1165 Type 2 diabetes mellitus with hyperglycemia: Secondary | ICD-10-CM | POA: Diagnosis present

## 2016-03-13 DIAGNOSIS — R197 Diarrhea, unspecified: Secondary | ICD-10-CM | POA: Diagnosis not present

## 2016-03-13 DIAGNOSIS — K2970 Gastritis, unspecified, without bleeding: Secondary | ICD-10-CM | POA: Diagnosis not present

## 2016-03-13 DIAGNOSIS — T462X1A Poisoning by other antidysrhythmic drugs, accidental (unintentional), initial encounter: Secondary | ICD-10-CM

## 2016-03-13 LAB — URINALYSIS COMPLETE WITH MICROSCOPIC (ARMC ONLY)
BILIRUBIN URINE: NEGATIVE
GLUCOSE, UA: NEGATIVE mg/dL
Ketones, ur: NEGATIVE mg/dL
Nitrite: NEGATIVE
Protein, ur: 30 mg/dL — AB
Specific Gravity, Urine: 1.049 — ABNORMAL HIGH (ref 1.005–1.030)
pH: 5 (ref 5.0–8.0)

## 2016-03-13 LAB — COMPREHENSIVE METABOLIC PANEL
ALT: 19 U/L (ref 14–54)
AST: 25 U/L (ref 15–41)
Albumin: 3.8 g/dL (ref 3.5–5.0)
Alkaline Phosphatase: 104 U/L (ref 38–126)
Anion gap: 10 (ref 5–15)
BUN: 11 mg/dL (ref 6–20)
CHLORIDE: 108 mmol/L (ref 101–111)
CO2: 22 mmol/L (ref 22–32)
Calcium: 8.8 mg/dL — ABNORMAL LOW (ref 8.9–10.3)
Creatinine, Ser: 0.7 mg/dL (ref 0.44–1.00)
Glucose, Bld: 152 mg/dL — ABNORMAL HIGH (ref 65–99)
POTASSIUM: 3.9 mmol/L (ref 3.5–5.1)
Sodium: 140 mmol/L (ref 135–145)
Total Bilirubin: 1.1 mg/dL (ref 0.3–1.2)
Total Protein: 7 g/dL (ref 6.5–8.1)

## 2016-03-13 LAB — CBC
HCT: 31.8 % — ABNORMAL LOW (ref 35.0–47.0)
Hemoglobin: 10.5 g/dL — ABNORMAL LOW (ref 12.0–16.0)
MCH: 25.6 pg — AB (ref 26.0–34.0)
MCHC: 33 g/dL (ref 32.0–36.0)
MCV: 77.4 fL — AB (ref 80.0–100.0)
PLATELETS: 133 10*3/uL — AB (ref 150–440)
RBC: 4.11 MIL/uL (ref 3.80–5.20)
RDW: 17.5 % — AB (ref 11.5–14.5)
WBC: 6.4 10*3/uL (ref 3.6–11.0)

## 2016-03-13 LAB — LIPASE, BLOOD: Lipase: 18 U/L (ref 11–51)

## 2016-03-13 LAB — TROPONIN I

## 2016-03-13 LAB — GLUCOSE, CAPILLARY
GLUCOSE-CAPILLARY: 142 mg/dL — AB (ref 65–99)
Glucose-Capillary: 169 mg/dL — ABNORMAL HIGH (ref 65–99)

## 2016-03-13 MED ORDER — FENTANYL CITRATE (PF) 100 MCG/2ML IJ SOLN
INTRAMUSCULAR | Status: AC
  Administered 2016-03-13: 50 ug via INTRAVENOUS
  Filled 2016-03-13: qty 2

## 2016-03-13 MED ORDER — HYDROMORPHONE HCL 1 MG/ML IJ SOLN
1.0000 mg | Freq: Once | INTRAMUSCULAR | Status: AC
Start: 2016-03-13 — End: 2016-03-13
  Administered 2016-03-13: 1 mg via INTRAVENOUS
  Filled 2016-03-13: qty 1

## 2016-03-13 MED ORDER — HYDROMORPHONE HCL 1 MG/ML IJ SOLN
0.5000 mg | INTRAMUSCULAR | Status: DC | PRN
  Administered 2016-03-13 – 2016-03-14 (×4): 0.5 mg via INTRAVENOUS
  Filled 2016-03-13 (×4): qty 1

## 2016-03-13 MED ORDER — PIPERACILLIN-TAZOBACTAM 3.375 G IVPB 30 MIN
3.3750 g | Freq: Once | INTRAVENOUS | Status: AC
  Administered 2016-03-13: 3.375 g via INTRAVENOUS
  Filled 2016-03-13: qty 50

## 2016-03-13 MED ORDER — METRONIDAZOLE IN NACL 5-0.79 MG/ML-% IV SOLN
500.0000 mg | Freq: Three times a day (TID) | INTRAVENOUS | Status: DC
  Administered 2016-03-13 – 2016-03-14 (×2): 500 mg via INTRAVENOUS
  Filled 2016-03-13 (×4): qty 100

## 2016-03-13 MED ORDER — INSULIN ASPART 100 UNIT/ML ~~LOC~~ SOLN
0.0000 [IU] | Freq: Four times a day (QID) | SUBCUTANEOUS | Status: DC
  Administered 2016-03-13 – 2016-03-14 (×2): 2 [IU] via SUBCUTANEOUS
  Administered 2016-03-14: 03:00:00 1 [IU] via SUBCUTANEOUS
  Filled 2016-03-13: qty 2
  Filled 2016-03-13: qty 1
  Filled 2016-03-13: qty 2

## 2016-03-13 MED ORDER — METOPROLOL TARTRATE 25 MG PO TABS
25.0000 mg | ORAL_TABLET | Freq: Two times a day (BID) | ORAL | Status: DC
Start: 2016-03-13 — End: 2016-03-14
  Administered 2016-03-13: 25 mg via ORAL
  Filled 2016-03-13: qty 1

## 2016-03-13 MED ORDER — ONDANSETRON HCL 4 MG/2ML IJ SOLN
4.0000 mg | Freq: Four times a day (QID) | INTRAMUSCULAR | Status: DC | PRN
  Administered 2016-03-15 – 2016-03-16 (×2): 4 mg via INTRAVENOUS
  Filled 2016-03-13 (×2): qty 2

## 2016-03-13 MED ORDER — IOPAMIDOL (ISOVUE-300) INJECTION 61%
100.0000 mL | Freq: Once | INTRAVENOUS | Status: AC | PRN
  Administered 2016-03-13: 100 mL via INTRAVENOUS

## 2016-03-13 MED ORDER — METOCLOPRAMIDE HCL 5 MG/ML IJ SOLN
INTRAMUSCULAR | Status: AC
  Administered 2016-03-13: 10 mg via INTRAVENOUS
  Filled 2016-03-13: qty 2

## 2016-03-13 MED ORDER — FENTANYL CITRATE (PF) 100 MCG/2ML IJ SOLN
50.0000 ug | Freq: Once | INTRAMUSCULAR | Status: AC
  Administered 2016-03-13: 50 ug via INTRAVENOUS

## 2016-03-13 MED ORDER — FENTANYL CITRATE (PF) 100 MCG/2ML IJ SOLN
50.0000 ug | Freq: Once | INTRAMUSCULAR | Status: AC
  Administered 2016-03-13: 50 ug via INTRAVENOUS
  Filled 2016-03-13: qty 2

## 2016-03-13 MED ORDER — PANTOPRAZOLE SODIUM 40 MG PO TBEC
40.0000 mg | DELAYED_RELEASE_TABLET | Freq: Two times a day (BID) | ORAL | Status: DC
  Administered 2016-03-13 – 2016-03-16 (×6): 40 mg via ORAL
  Filled 2016-03-13 (×6): qty 1

## 2016-03-13 MED ORDER — LEVOTHYROXINE SODIUM 50 MCG PO TABS
75.0000 ug | ORAL_TABLET | Freq: Every day | ORAL | Status: DC
  Administered 2016-03-14 – 2016-03-16 (×3): 75 ug via ORAL
  Filled 2016-03-13 (×2): qty 2
  Filled 2016-03-13 (×2): qty 1

## 2016-03-13 MED ORDER — SODIUM CHLORIDE 0.9% FLUSH
3.0000 mL | Freq: Two times a day (BID) | INTRAVENOUS | Status: DC
  Administered 2016-03-13 – 2016-03-16 (×5): 3 mL via INTRAVENOUS

## 2016-03-13 MED ORDER — VANCOMYCIN 50 MG/ML ORAL SOLUTION
500.0000 mg | Freq: Four times a day (QID) | ORAL | Status: DC
  Administered 2016-03-13 – 2016-03-16 (×9): 500 mg via ORAL
  Filled 2016-03-13 (×12): qty 10

## 2016-03-13 MED ORDER — SPIRONOLACTONE 25 MG PO TABS
25.0000 mg | ORAL_TABLET | Freq: Every day | ORAL | Status: DC
  Administered 2016-03-13 – 2016-03-16 (×4): 25 mg via ORAL
  Filled 2016-03-13 (×4): qty 1

## 2016-03-13 MED ORDER — PROCHLORPERAZINE EDISYLATE 5 MG/ML IJ SOLN
INTRAMUSCULAR | Status: AC
  Administered 2016-03-13: 10 mg via INTRAVENOUS
  Filled 2016-03-13: qty 2

## 2016-03-13 MED ORDER — FUROSEMIDE 20 MG PO TABS
20.0000 mg | ORAL_TABLET | Freq: Every day | ORAL | Status: DC
  Administered 2016-03-13 – 2016-03-16 (×4): 20 mg via ORAL
  Filled 2016-03-13 (×4): qty 1

## 2016-03-13 MED ORDER — PROCHLORPERAZINE EDISYLATE 5 MG/ML IJ SOLN
10.0000 mg | Freq: Four times a day (QID) | INTRAMUSCULAR | Status: DC | PRN
  Administered 2016-03-13 – 2016-03-16 (×7): 10 mg via INTRAVENOUS
  Filled 2016-03-13 (×6): qty 2

## 2016-03-13 MED ORDER — LABETALOL HCL 5 MG/ML IV SOLN
10.0000 mg | INTRAVENOUS | Status: DC | PRN
  Administered 2016-03-14: 10 mg via INTRAVENOUS
  Filled 2016-03-13 (×2): qty 4

## 2016-03-13 MED ORDER — MORPHINE SULFATE ER 15 MG PO TBCR
15.0000 mg | EXTENDED_RELEASE_TABLET | Freq: Three times a day (TID) | ORAL | Status: DC
  Administered 2016-03-13 – 2016-03-16 (×8): 15 mg via ORAL
  Filled 2016-03-13 (×8): qty 1

## 2016-03-13 MED ORDER — METOCLOPRAMIDE HCL 5 MG/ML IJ SOLN
10.0000 mg | Freq: Once | INTRAMUSCULAR | Status: AC
  Administered 2016-03-13: 10 mg via INTRAVENOUS

## 2016-03-13 MED ORDER — SODIUM CHLORIDE 0.9 % IV BOLUS (SEPSIS)
1000.0000 mL | Freq: Once | INTRAVENOUS | Status: AC
  Administered 2016-03-13: 1000 mL via INTRAVENOUS

## 2016-03-13 MED ORDER — DIATRIZOATE MEGLUMINE & SODIUM 66-10 % PO SOLN
15.0000 mL | Freq: Once | ORAL | Status: AC
  Administered 2016-03-13: 15 mL via ORAL

## 2016-03-13 MED ORDER — RIFAXIMIN 550 MG PO TABS
550.0000 mg | ORAL_TABLET | Freq: Two times a day (BID) | ORAL | Status: DC
  Administered 2016-03-13 – 2016-03-16 (×6): 550 mg via ORAL
  Filled 2016-03-13 (×7): qty 1

## 2016-03-13 MED ORDER — APIXABAN 5 MG PO TABS
5.0000 mg | ORAL_TABLET | Freq: Two times a day (BID) | ORAL | Status: DC
  Administered 2016-03-13 – 2016-03-16 (×6): 5 mg via ORAL
  Filled 2016-03-13 (×6): qty 1

## 2016-03-13 MED ORDER — SODIUM CHLORIDE 0.9 % IV SOLN
INTRAVENOUS | Status: AC
  Administered 2016-03-13: 22:00:00 via INTRAVENOUS

## 2016-03-13 MED ORDER — MOMETASONE FURO-FORMOTEROL FUM 200-5 MCG/ACT IN AERO
2.0000 | INHALATION_SPRAY | Freq: Two times a day (BID) | RESPIRATORY_TRACT | Status: DC
  Administered 2016-03-13 – 2016-03-15 (×5): 2 via RESPIRATORY_TRACT
  Filled 2016-03-13: qty 8.8

## 2016-03-13 NOTE — ED Provider Notes (Signed)
[facility] Emergency Department Provider Note  Time seen: 4:47 PM  I have reviewed the triage vital signs and the nursing notes.   HISTORY  Chief Complaint Abdominal Pain    HPI [name] is a 60 y.o. female with a past medical history of diabetes, diverticulitis, anxiety, gastric reflux, TIA, pancreatitis, CVA, gastroparesis who presents the emergency department epigastric pain, nausea, vomiting, diarrhea. According to the patient for the past 3 days she has had severe epigastric pain which she describes as a sharp 10/10 pain located in the epigastrium. She also states she has been very nauseated with several episodes of vomiting and several episodes of loose stool. Denies any black or bloody stool. States she has not been able to keep down any food or fluids for 3 days. Denies fever, dysuria, chest pain or trouble breathing.     Past Medical History  Diagnosis Date  . Type 1 diabetes ([place])     on levemir  . Hypothyroid   . Degenerative disk disease   . Stomach ulcer   . Diverticulitis   . Syncope 01/2015  . Anxiety   . GERD (gastroesophageal reflux disease)   . History of hiatal hernia   . Cancer (HCC)     HX OF CANCER OF UTERUS   . TIA (transient ischemic attack) 02/2015  . Hypertension   . Pancreatitis   . PAF (paroxysmal atrial fibrillation) ([place]) 03/2015    a. new onset 03/2015 in setting of intractable N/V; b. on Eliquis 5 mg bid; c. CHADSVASc 4 (DM, TIA x 2, female)  . Intussusception intestine ([place]) 05/2015  . Stroke [facility])     with minimal left sided weakness  . Allergy   . Cirrhosis of liver not due to alcohol ([place]) 2016  . Gastroparesis   . Sick sinus syndrome [facility])     Patient Active Problem List   Diagnosis Date Noted  . Epigastric pain   . Abdominal pain, epigastric   . Personal history of surgery to heart and great vessels, presenting hazards to health   . Gastritis   . Foreign body in stomach   . Abnormal  findings-gastrointestinal tract   . Diarrhea   . PAF (paroxysmal atrial fibrillation) ([place])   . Congestive dilated cardiomyopathy ([place])   . Generalized abdominal pain   . Nausea & vomiting   . NSTEMI (non-ST elevated myocardial infarction) ([place]) 01/11/2016  . Tachyarrhythmia 01/10/2016  . Intractable nausea and vomiting 01/10/2016  . Tachy-brady syndrome ([place]) 12/28/2015  . Abdominal pain 12/27/2015  . Colitis 12/16/2015  . Pneumonia 11/14/2015  . Narcotic withdrawal ([place]) 11/11/2015  . Diabetes mellitus type 2, insulin dependent ([place]) 11/11/2015  . Orthostatic hypotension   . H/O TIA (transient ischemic attack) and stroke   . Left-sided weakness 10/04/2015  . Expressive aphasia 10/04/2015  . Ileus ([place]) 08/01/2015  . Ascites   . Cryptogenic cirrhosis ([place]) 07/10/2015  . Paroxysmal atrial fibrillation ([place]) 07/10/2015  . C. difficile colitis 07/10/2015  . GI (gastrointestinal bleed) 07/06/2015  . Malnutrition of moderate degree 07/04/2015  . Symptomatic bradycardia 05/27/2015  . Intussusception intestine ([place])   . Hypothyroidism due to amiodarone   . Abdominal pain, chronic, epigastric   . Chronic anemia 05/19/2015  . Thrombocytopenia ([place]) 05/19/2015  . Prolonged QT interval   . Hypomagnesemia   . Narcotic abuse   . Uncontrolled type 2 diabetes mellitus with gastroparesis ([place])   . Syncope due to orthostatic hypotension 05/18/2015  . Hypokalemia 04/06/2015  .  Hyperlipidemia with target LDL less than 100 04/06/2015  . CVA (cerebral infarction) 02/15/2015  . Essential hypertension 01/12/2015  . Chronically on opiate therapy 01/12/2015  . Gastroparesis 01/12/2015  . DEPRESSION/ANXIETY 06/27/2007  . MYOFASCIAL PAIN SYNDROME 06/27/2007  . Chronic pain syndrome 03/28/2007  . GERD 03/27/2007  . DIVERTICULOSIS, COLON 03/27/2007  . LUMBAR DISC DISPLACEMENT 03/27/2007  . PROTEINURIA 03/27/2007  . UTERINE CANCER, HX OF 03/27/2007    Past Surgical History  Procedure  Laterality Date  . Hernia repair    . Abdominal hysterectomy    . Cholecystectomy    . Esophagogastroduodenoscopy N/A 04/04/2015    Procedure: ESOPHAGOGASTRODUODENOSCOPY (EGD);  Surgeon: Hulen Luster, MD;  Location: [facility] ENDOSCOPY;  Service: Endoscopy;  Laterality: N/A;  . Cardiac catheterization N/A 01/12/2016    Procedure: Left Heart Cath and Coronary Angiography;  Surgeon: Wellington Hampshire, MD;  Location: [facility] CV LAB;  Service: Cardiovascular;  Laterality: N/A;  . Esophagogastroduodenoscopy (egd) with propofol N/A 01/18/2016    Procedure: ESOPHAGOGASTRODUODENOSCOPY (EGD) WITH PROPOFOL;  Surgeon: Lucilla Lame, MD;  Location: [facility] ENDOSCOPY;  Service: Endoscopy;  Laterality: N/A;  . Flexible sigmoidoscopy N/A 01/18/2016    Procedure: FLEXIBLE SIGMOIDOSCOPY;  Surgeon: Lucilla Lame, MD;  Location: [facility] ENDOSCOPY;  Service: Endoscopy;  Laterality: N/A;    Current Outpatient Rx  Name  Route  Sig  Dispense  Refill  . acidophilus (RISAQUAD) CAPS capsule   Oral   Take 2 capsules by mouth daily.   60 capsule   0   . albuterol (PROVENTIL HFA;VENTOLIN HFA) 108 (90 Base) MCG/ACT inhaler   Inhalation   Inhale 2 puffs into the lungs every 6 (six) hours as needed for wheezing or shortness of breath.   1 Inhaler   2   . apixaban (ELIQUIS) 5 MG TABS tablet   Oral   Take 1 tablet (5 mg total) by mouth 2 (two) times daily.   60 tablet   0   . atorvastatin (LIPITOR) 40 MG tablet   Oral   Take 40 mg by mouth at bedtime.         . bismuth subsalicylate (PEPTO BISMOL) 262 MG/15ML suspension   Oral   Take 30 mLs by mouth every 4 (four) hours as needed for diarrhea or loose stools.   360 mL   0   . budesonide-formoterol (SYMBICORT) 160-4.5 MCG/ACT inhaler   Inhalation   Inhale 2 puffs into the lungs 2 (two) times daily.   1 Inhaler   12   . cephALEXin (KEFLEX) 500 MG capsule   Oral   Take 1 capsule (500 mg total) by mouth 3 (three) times daily.   21 capsule   0   . dicyclomine  (BENTYL) 10 MG capsule   Oral   Take 10 mg by mouth 4 (four) times daily -  before meals and at bedtime.          . diphenhydrAMINE (BENADRYL) 25 mg capsule   Oral   Take 2 capsules (50 mg total) by mouth every 6 (six) hours as needed.   60 capsule   0   . furosemide (LASIX) 20 MG tablet   Oral   Take 1 tablet (20 mg total) by mouth daily.   30 tablet   0   . insulin aspart (NOVOLOG) 100 UNIT/ML injection   Subcutaneous   Inject into the skin 3 (three) times daily with meals as needed for high blood sugar. Pt uses as needed per sliding scale.         Marland Kitchen  insulin detemir (LEVEMIR) 100 UNIT/ML injection   Subcutaneous   Inject 0.1 mLs (10 Units total) into the skin at bedtime.   10 mL   11   . levothyroxine (SYNTHROID, LEVOTHROID) 75 MCG tablet   Oral   Take 1 tablet (75 mcg total) by mouth daily before breakfast.   30 tablet   1   . metoCLOPramide (REGLAN) 10 MG tablet   Oral   Take 1 tablet (10 mg total) by mouth 4 (four) times daily -  before meals and at bedtime.   60 tablet   0   . metoprolol tartrate (LOPRESSOR) 25 MG tablet   Oral   Take 25 mg by mouth 2 (two) times daily.          Marland Kitchen morphine (MS CONTIN) 15 MG 12 hr tablet   Oral   Take 1 tablet (15 mg total) by mouth every 8 (eight) hours.   75 tablet   0   . ondansetron (ZOFRAN ODT) 4 MG disintegrating tablet   Oral   Take 1 tablet (4 mg total) by mouth every 8 (eight) hours as needed for nausea or vomiting.   20 tablet   0   . oxyCODONE (OXY IR/ROXICODONE) 5 MG immediate release tablet   Oral   Take 5-10 mg by mouth every 6 (six) hours as needed for severe pain.         . pantoprazole (PROTONIX) 40 MG tablet   Oral   Take 40 mg by mouth 2 (two) times daily before a meal.         . rifaximin (XIFAXAN) 550 MG TABS tablet   Oral   Take 1 tablet (550 mg total) by mouth 2 (two) times daily.   60 tablet   6   . spironolactone (ALDACTONE) 25 MG tablet   Oral   Take 1 tablet (25 mg total)  by mouth daily.   30 tablet   0   . tiZANidine (ZANAFLEX) 4 MG tablet   Oral   Take 4 mg by mouth 3 (three) times daily as needed for muscle spasms.            Allergies Hydrocodone; Aspirin; Erythromycin; Prednisone; Rosiglitazone maleate; Codeine sulfate; and Tetanus-diphtheria toxoids td  Family History  Problem Relation Age of Onset  . Hypertension Mother   . CAD Sister   . Heart attack Sister     Deceased 2014/11/07  . CAD Brother     Social History Social History  Substance Use Topics  . Smoking status: Never Smoker   . Smokeless tobacco: Never Used  . Alcohol Use: No    Review of Systems Constitutional: Negative for fever. Cardiovascular: Negative for chest pain. Respiratory: Negative for shortness of breath. Gastrointestinal: Positive for epigastric pain, nausea, vomiting, diarrhea Genitourinary: Negative for dysuria. Neurological: Negative for headache 10-point ROS otherwise negative.  ____________________________________________   PHYSICAL EXAM:  VITAL SIGNS: ED Triage Vitals  Enc Vitals Group     BP 03/13/16 1615 186/98 mmHg     Pulse Rate 03/13/16 1615 123     Resp 03/13/16 1615 12     Temp 03/13/16 1613 98.1 F (36.7 C)     Temp src --      SpO2 03/13/16 1615 97 %     Weight --      Height --      Head Cir --      Peak Flow --      Pain Score 03/13/16 1613 10  Pain Loc --      Pain Edu? --      Excl. in [place]? --     Constitutional: Alert and oriented. Well appearing and in no distress. Eyes: Normal exam ENT   Head: Normocephalic and atraumatic.   Mouth/Throat: Mucous membranes are moist. Cardiovascular: Normal rate, regular rhythm. No murmur Respiratory: Normal respiratory effort without tachypnea nor retractions. Breath sounds are clear Gastrointestinal: Soft, moderate epigastric tenderness to palpation. No rebound or guarding. Mild distention with tympanic percussion. Musculoskeletal: Nontender with normal range of motion in  all extremities.  Neurologic:  Normal speech and language. No gross focal neurologic deficits  Skin:  Skin is warm, dry and intact.  Psychiatric: Mood and affect are normal. Speech and behavior are normal.   ____________________________________________    RADIOLOGY  CT consistent with a ascending colitis.  EKG reviewed and interpreted by myself shows sinus tachycardia 123 bpm, narrow QRS, normal axis, normal intervals, nonspecific but no concerning ST changes.  ____________________________________________    INITIAL IMPRESSION / ASSESSMENT AND PLAN / ED COURSE  Pertinent labs & imaging results that were available during my care of the patient were reviewed by me and considered in my medical decision making (see chart for details).  Patient was this emergency department epigastric pain, nausea, vomiting, diarrhea. Patient has a moderate epigastric tenderness palpation, mild distention with tympanic percussion. We will obtain labs, likely CT given the degree of tenderness and distention.  CT scan consistent with ascending colitis. Patient remains tachycardic remains 3 and abdominal pain. I dosed IV Zosyn. We will continue to his IVP medication. The patient will be admitted to the hospital for further treatment.  ____________________________________________   FINAL CLINICAL IMPRESSION(S) / ED DIAGNOSES  Epigastric pain Nausea vomiting diarrhea Colitis  Harvest Dark, MD 03/13/16 1945

## 2016-03-13 NOTE — ED Notes (Signed)
Pt reports upper abd pain with N/V/D fpr 3 days

## 2016-03-13 NOTE — H&P (Signed)
[facility] NAME: [name]    MR#:  [id number]  DATE OF BIRTH:  December 25, 1955  DATE OF ADMISSION:  03/13/2016  PRIMARY CARE PHYSICIAN: No PCP Per Patient   REQUESTING/REFERRING PHYSICIAN: Kerman Passey, MD  CHIEF COMPLAINT:   Chief Complaint  Patient presents with  . Abdominal Pain    HISTORY OF PRESENT ILLNESS:  [name]  is a 60 y.o. female who presents with Several days of abdominal pain with significant diarrhea. Patient says that her diarrhea is very watery, and that when she goes she typically has 4-6 large volume bowel movements within a couple hours. She says the diarrhea is most or yellow color, and has a very strong and foul odor. Patient states that this is very similar to her symptoms when she had C. difficile. She has had C. difficile infection 2 times prior in recent history. CT scan in the ED showed colitis. Hospitalists were called for admission.  PAST MEDICAL HISTORY:   Past Medical History  Diagnosis Date  . Type 1 diabetes ([place])     on levemir  . Hypothyroid   . Degenerative disk disease   . Stomach ulcer   . Diverticulitis   . Syncope 01/2015  . Anxiety   . GERD (gastroesophageal reflux disease)   . History of hiatal hernia   . Cancer (HCC)     HX OF CANCER OF UTERUS   . TIA (transient ischemic attack) 02/2015  . Hypertension   . Pancreatitis   . PAF (paroxysmal atrial fibrillation) ([place]) 03/2015    a. new onset 03/2015 in setting of intractable N/V; b. on Eliquis 5 mg bid; c. CHADSVASc 4 (DM, TIA x 2, female)  . Intussusception intestine ([place]) 05/2015  . Stroke [facility])     with minimal left sided weakness  . Allergy   . Cirrhosis of liver not due to alcohol ([place]) 2014-11-08  . Gastroparesis   . Sick sinus syndrome ([place])     PAST SURGICAL HISTORY:   Past Surgical History  Procedure Laterality Date  . Hernia repair    . Abdominal hysterectomy    . Cholecystectomy    .  Esophagogastroduodenoscopy N/A 04/04/2015    Procedure: ESOPHAGOGASTRODUODENOSCOPY (EGD);  Surgeon: Hulen Luster, MD;  Location: [facility] ENDOSCOPY;  Service: Endoscopy;  Laterality: N/A;  . Cardiac catheterization N/A 01/12/2016    Procedure: Left Heart Cath and Coronary Angiography;  Surgeon: Wellington Hampshire, MD;  Location: [place] CV LAB;  Service: Cardiovascular;  Laterality: N/A;  . Esophagogastroduodenoscopy (egd) with propofol N/A 01/18/2016    Procedure: ESOPHAGOGASTRODUODENOSCOPY (EGD) WITH PROPOFOL;  Surgeon: Lucilla Lame, MD;  Location: [facility] ENDOSCOPY;  Service: Endoscopy;  Laterality: N/A;  . Flexible sigmoidoscopy N/A 01/18/2016    Procedure: FLEXIBLE SIGMOIDOSCOPY;  Surgeon: Lucilla Lame, MD;  Location: [facility] ENDOSCOPY;  Service: Endoscopy;  Laterality: N/A;    SOCIAL HISTORY:   Social History  Substance Use Topics  . Smoking status: Never Smoker   . Smokeless tobacco: Never Used  . Alcohol Use: No    FAMILY HISTORY:   Family History  Problem Relation Age of Onset  . Hypertension Mother   . CAD Sister   . Heart attack Sister     Deceased 11-08-14  . CAD Brother     DRUG ALLERGIES:   Allergies  Allergen Reactions  . Hydrocodone Other (See Comments)    Pt states that this medication caused cirrhosis of the liver.    Marland Kitchen  Aspirin   . Erythromycin Other (See Comments)    Reaction:  Fever   . Prednisone Other (See Comments)    Reaction:  Unknown   . Rosiglitazone Maleate Swelling  . Codeine Sulfate Rash  . Tetanus-Diphtheria Toxoids Td Rash and Other (See Comments)    Reaction:  Fever     MEDICATIONS AT HOME:   Prior to Admission medications   Medication Sig Start Date End Date Taking? Authorizing Provider  acidophilus (RISAQUAD) CAPS capsule Take 2 capsules by mouth daily. 01/25/16   Bettey Costa, MD  albuterol (PROVENTIL HFA;VENTOLIN HFA) 108 (90 Base) MCG/ACT inhaler Inhale 2 puffs into the lungs every 6 (six) hours as needed for wheezing or shortness of breath.  12/19/15   Gladstone Lighter, MD  apixaban (ELIQUIS) 5 MG TABS tablet Take 1 tablet (5 mg total) by mouth 2 (two) times daily. 04/07/15   Bettey Costa, MD  atorvastatin (LIPITOR) 40 MG tablet Take 40 mg by mouth at bedtime.    Historical Provider, MD  bismuth subsalicylate (PEPTO BISMOL) 262 MG/15ML suspension Take 30 mLs by mouth every 4 (four) hours as needed for diarrhea or loose stools. 01/25/16   Bettey Costa, MD  budesonide-formoterol (SYMBICORT) 160-4.5 MCG/ACT inhaler Inhale 2 puffs into the lungs 2 (two) times daily. 12/19/15   Gladstone Lighter, MD  cephALEXin (KEFLEX) 500 MG capsule Take 1 capsule (500 mg total) by mouth 3 (three) times daily. 02/09/16   Carrie Mew, MD  dicyclomine (BENTYL) 10 MG capsule Take 10 mg by mouth 4 (four) times daily -  before meals and at bedtime.     Historical Provider, MD  diphenhydrAMINE (BENADRYL) 25 mg capsule Take 2 capsules (50 mg total) by mouth every 6 (six) hours as needed. 02/09/16   Carrie Mew, MD  furosemide (LASIX) 20 MG tablet Take 1 tablet (20 mg total) by mouth daily. 02/07/16   Srikar Sudini, MD  insulin aspart (NOVOLOG) 100 UNIT/ML injection Inject into the skin 3 (three) times daily with meals as needed for high blood sugar. Pt uses as needed per sliding scale.    Historical Provider, MD  insulin detemir (LEVEMIR) 100 UNIT/ML injection Inject 0.1 mLs (10 Units total) into the skin at bedtime. 01/25/16   Bettey Costa, MD  levothyroxine (SYNTHROID, LEVOTHROID) 75 MCG tablet Take 1 tablet (75 mcg total) by mouth daily before breakfast. 10/08/15   Loleta Chance, MD  metoCLOPramide (REGLAN) 10 MG tablet Take 1 tablet (10 mg total) by mouth 4 (four) times daily -  before meals and at bedtime. 02/09/16   Carrie Mew, MD  metoprolol tartrate (LOPRESSOR) 25 MG tablet Take 25 mg by mouth 2 (two) times daily.     Historical Provider, MD  morphine (MS CONTIN) 15 MG 12 hr tablet Take 1 tablet (15 mg total) by mouth every 8 (eight) hours. 10/21/15   Lance Bosch, MD  ondansetron (ZOFRAN ODT) 4 MG disintegrating tablet Take 1 tablet (4 mg total) by mouth every 8 (eight) hours as needed for nausea or vomiting. 02/09/16   Carrie Mew, MD  oxyCODONE (OXY IR/ROXICODONE) 5 MG immediate release tablet Take 5-10 mg by mouth every 6 (six) hours as needed for severe pain.    Historical Provider, MD  pantoprazole (PROTONIX) 40 MG tablet Take 40 mg by mouth 2 (two) times daily before a meal.    Historical Provider, MD  rifaximin (XIFAXAN) 550 MG TABS tablet Take 1 tablet (550 mg total) by mouth 2 (two) times daily. 07/11/15   Rima  Ether Griffins, MD  spironolactone (ALDACTONE) 25 MG tablet Take 1 tablet (25 mg total) by mouth daily. 02/07/16   Srikar Sudini, MD  tiZANidine (ZANAFLEX) 4 MG tablet Take 4 mg by mouth 3 (three) times daily as needed for muscle spasms.     Historical Provider, MD    REVIEW OF SYSTEMS:  Review of Systems  Constitutional: Negative for fever, chills, weight loss and malaise/fatigue.  HENT: Negative for ear pain, hearing loss and tinnitus.   Eyes: Negative for blurred vision, double vision, pain and redness.  Respiratory: Negative for cough, hemoptysis and shortness of breath.   Cardiovascular: Negative for chest pain, palpitations, orthopnea and leg swelling.  Gastrointestinal: Positive for nausea, abdominal pain and diarrhea. Negative for vomiting and constipation.  Genitourinary: Negative for dysuria, frequency and hematuria.  Musculoskeletal: Negative for back pain, joint pain and neck pain.  Skin:       No acne, rash, or lesions  Neurological: Negative for dizziness, tremors, focal weakness and weakness.  Endo/Heme/Allergies: Negative for polydipsia. Does not bruise/bleed easily.  Psychiatric/Behavioral: Negative for depression. The patient is not nervous/anxious and does not have insomnia.      VITAL SIGNS:   Filed Vitals:   03/13/16 1613 03/13/16 1615 03/13/16 1615  BP:   186/98  Pulse:  123   Temp: 98.1 F (36.7 C)     Resp:  12   SpO2:  97%    Wt Readings from Last 3 Encounters:  02/09/16 58.968 kg (130 lb)  02/04/16 63.22 kg (139 lb 6 oz)  01/21/16 68.856 kg (151 lb 12.8 oz)    PHYSICAL EXAMINATION:  Physical Exam  Vitals reviewed. Constitutional: She is oriented to person, place, and time. She appears well-developed and well-nourished. No distress.  HENT:  Head: Normocephalic and atraumatic.  Mouth/Throat: Oropharynx is clear and moist.  Eyes: Conjunctivae and EOM are normal. Pupils are equal, round, and reactive to light. No scleral icterus.  Neck: Normal range of motion. Neck supple. No JVD present. No thyromegaly present.  Cardiovascular: Normal rate, regular rhythm and intact distal pulses.  Exam reveals no gallop and no friction rub.   No murmur heard. Respiratory: Effort normal and breath sounds normal. No respiratory distress. She has no wheezes. She has no rales.  GI: Soft. Bowel sounds are normal. She exhibits no distension. There is tenderness.  Musculoskeletal: Normal range of motion. She exhibits no edema.  No arthritis, no gout  Lymphadenopathy:    She has no cervical adenopathy.  Neurological: She is alert and oriented to person, place, and time. No cranial nerve deficit.  No dysarthria, no aphasia  Skin: Skin is warm and dry. No rash noted. No erythema.  Psychiatric: She has a normal mood and affect. Her behavior is normal. Judgment and thought content normal.    LABORATORY PANEL:   CBC  Recent Labs Lab 03/13/16 1643  WBC 6.4  HGB 10.5*  HCT 31.8*  PLT 133*   ------------------------------------------------------------------------------------------------------------------  Chemistries   Recent Labs Lab 03/13/16 1643  NA 140  K 3.9  CL 108  CO2 22  GLUCOSE 152*  BUN 11  CREATININE 0.70  CALCIUM 8.8*  AST 25  ALT 19  ALKPHOS 104  BILITOT 1.1    ------------------------------------------------------------------------------------------------------------------  Cardiac Enzymes  Recent Labs Lab 03/13/16 1643  TROPONINI <0.03   ------------------------------------------------------------------------------------------------------------------  RADIOLOGY:  Ct Abdomen Pelvis W Contrast  03/13/2016  CLINICAL DATA:  Upper abdominal pain with nausea, vomiting and diarrhea for 3 days. History of ovarian cancer. EXAM:  CT ABDOMEN AND PELVIS WITH CONTRAST TECHNIQUE: Multidetector CT imaging of the abdomen and pelvis was performed using the standard protocol following bolus administration of intravenous contrast. CONTRAST:  11m ISOVUE-300 IOPAMIDOL (ISOVUE-300) INJECTION 61% COMPARISON:  None. FINDINGS: Lower chest:  No acute findings. Hepatobiliary: Cirrhotic-appearing liver. No focal mass or lesion identified within the liver. Patient is status post cholecystectomy. Pancreas: No mass, inflammatory changes, or other significant abnormality. Spleen: Upper normal in size. Adrenals/Urinary Tract: Irregular nonobstructive right renal stone measuring at least 1.3 cm greatest dimension. No associated hydronephrosis. No left renal stone or hydronephrosis. No ureteral or bladder calculi identified. Stomach/Bowel: No dilated large or small bowel loops. There does appear to be pronounced thickening of the walls of the ascending colon. Appendix appears grossly normal, partially obscured by the ascites. Stomach appears normal. Vascular/Lymphatic: Scattered atherosclerotic changes of the normal caliber abdominal aorta. No enlarged lymph nodes seen in the abdomen or pelvis. Reproductive: No mass or other significant abnormality. Other: Large amount of ascites throughout the abdomen and pelvis. Upper abdominal and paraesophageal varices. No free intraperitoneal air. No abscess collection seen Musculoskeletal: Mild degenerative change throughout the scoliotic  thoracolumbar spine. No acute or suspicious osseous finding. Superficial soft tissues are unremarkable. IMPRESSION: 1. Cirrhotic liver. Associated upper abdominal and paraesophageal varices. Associated large volume ascites throughout the abdomen and pelvis. 2. Pronounced thickening of the walls of the majority of the ascending colon. This may be reactive thickening caused by the adjacent ascites but is more likely indicative of an acute colitis of infectious, inflammatory or ischemic nature. No associated bowel obstruction. 3. Aortic atherosclerosis. 4. Nonobstructing right renal stone. Electronically Signed   By: SFranki CabotM.D.   On: 03/13/2016 18:27    EKG:   Orders placed or performed during the hospital encounter of 02/09/16  . EKG 12-Lead  . EKG 12-Lead  . ED EKG  . ED EKG    IMPRESSION AND PLAN:  Principal Problem:   Colitis - We'll collect and send stool culture for also C. difficile. Consult gastroenterology. IV antibiotics to cover for likely C. difficile infection. Active Problems:   Uncontrolled type 2 diabetes mellitus with gastroparesis (HCC) - sliding scale insulin with corresponding glucose checks   Cryptogenic cirrhosis (HCC) - avoid hepatotoxins   Paroxysmal atrial fibrillation (HCC) - continue rate controlling meds, as well as anticoagulation   GERD - home dose PPI   Hyperlipidemia with target LDL less than 100 - home dose anti-lipids   Hypothyroidism due to amiodarone - home dose thyroid replacement  All the records are reviewed and case discussed with ED provider. Management plans discussed with the patient and/or family.  DVT PROPHYLAXIS: Systemic anticoagulation  GI PROPHYLAXIS: PPI  ADMISSION STATUS: Inpatient  CODE STATUS: Full Code Status History    Date Active Date Inactive Code Status Order ID Comments User Context   02/04/2016  9:39 AM 02/07/2016  2:59 PM DNR [id number] PSaundra Shelling MD Inpatient   01/21/2016 10:24 PM 01/25/2016  5:02 PM DNR [id number]  DLance Coon MD Inpatient   01/15/2016  1:39 PM 01/19/2016  3:29 PM DNR [id number] RGladstone Lighter MD ED   01/10/2016 10:00 PM 01/13/2016  5:24 PM DNR [id number] MHarrie Foreman MD Inpatient   12/27/2015 11:02 PM 01/01/2016  6:17 PM DNR [id number] VMax Sane MD Inpatient   12/16/2015  4:54 PM 12/19/2015  4:52 PM DNR [id number] RLoletha Grayer MD ED   11/11/2015 11:36 AM 11/16/2015  2:05 PM Full Code [id number]  Samella Parr, NP Inpatient   10/15/2015  1:23 AM 10/16/2015  6:17 PM Full Code [id number]  Lance Coon, MD Inpatient   10/04/2015  8:19 PM 10/08/2015  6:34 PM Full Code [id number]  Bethena Roys, MD Inpatient   08/15/2015  1:05 AM 08/16/2015  8:04 PM Full Code [id number]  Harrie Foreman, MD Inpatient   08/01/2015  1:25 AM 08/06/2015  5:41 PM Full Code [id number]  Harrie Foreman, MD Inpatient   07/04/2015  2:53 AM 07/11/2015  6:33 PM Full Code [id number]  Lytle Butte, MD ED   05/27/2015 10:48 PM 05/31/2015  2:37 PM Full Code [id number]  Rise Patience, MD Inpatient   05/19/2015  1:14 AM 05/22/2015  4:28 PM Full Code [id number]  Rise Patience, MD Inpatient   04/05/2015  9:16 AM 04/07/2015  5:23 PM Full Code [id number]  Bettey Costa, MD Inpatient   04/03/2015 12:31 PM 04/05/2015  9:16 AM Full Code [id number]  Bettey Costa, MD Inpatient   03/11/2015  4:49 PM 03/14/2015  3:50 PM Full Code [id number]  Dustin Flock, MD Inpatient   02/15/2015  4:51 PM 02/17/2015  2:33 PM DNR [id number]  Dustin Flock, MD Inpatient   02/15/2015  4:35 PM 02/15/2015  4:51 PM Full Code [id number]  Dustin Flock, MD Inpatient   01/11/2015  2:40 AM 01/12/2015  6:11 PM Full Code [id number]  Etta Quill, DO ED    Questions for Most Recent Historical Code Status (Order [id number])    Question Answer Comment   In the event of cardiac or respiratory ARREST Do not call a "code blue"    In the event of cardiac or respiratory ARREST Do not perform Intubation, CPR, defibrillation or ACLS    In the event of cardiac or respiratory ARREST Use  medication by any route, position, wound care, and other measures to relive pain and suffering. May use oxygen, suction and manual treatment of airway obstruction as needed for comfort.       TOTAL TIME TAKING CARE OF THIS PATIENT: 45 minutes.    Porche Steinberger [place] 03/13/2016, 8:37 PM  [facility]  Office  [phone]  CC: Primary care physician; No PCP Per Patient

## 2016-03-14 LAB — CBC
HEMATOCRIT: 30.1 % — AB (ref 35.0–47.0)
Hemoglobin: 10 g/dL — ABNORMAL LOW (ref 12.0–16.0)
MCH: 25.8 pg — ABNORMAL LOW (ref 26.0–34.0)
MCHC: 33.2 g/dL (ref 32.0–36.0)
MCV: 77.8 fL — ABNORMAL LOW (ref 80.0–100.0)
PLATELETS: 118 10*3/uL — AB (ref 150–440)
RBC: 3.86 MIL/uL (ref 3.80–5.20)
RDW: 17.2 % — AB (ref 11.5–14.5)
WBC: 4.6 10*3/uL (ref 3.6–11.0)

## 2016-03-14 LAB — BASIC METABOLIC PANEL
Anion gap: 6 (ref 5–15)
BUN: 11 mg/dL (ref 6–20)
CALCIUM: 8.2 mg/dL — AB (ref 8.9–10.3)
CO2: 25 mmol/L (ref 22–32)
CREATININE: 0.82 mg/dL (ref 0.44–1.00)
Chloride: 109 mmol/L (ref 101–111)
Glucose, Bld: 137 mg/dL — ABNORMAL HIGH (ref 65–99)
POTASSIUM: 3.5 mmol/L (ref 3.5–5.1)
Sodium: 140 mmol/L (ref 135–145)

## 2016-03-14 LAB — GLUCOSE, CAPILLARY
GLUCOSE-CAPILLARY: 136 mg/dL — AB (ref 65–99)
GLUCOSE-CAPILLARY: 157 mg/dL — AB (ref 65–99)
GLUCOSE-CAPILLARY: 121 mg/dL — AB (ref 65–99)
GLUCOSE-CAPILLARY: 160 mg/dL — AB (ref 65–99)
Glucose-Capillary: 173 mg/dL — ABNORMAL HIGH (ref 65–99)
Glucose-Capillary: 232 mg/dL — ABNORMAL HIGH (ref 65–99)

## 2016-03-14 LAB — HEMOGLOBIN A1C: Hgb A1c MFr Bld: 8.4 % — ABNORMAL HIGH (ref 4.0–6.0)

## 2016-03-14 MED ORDER — INSULIN ASPART 100 UNIT/ML ~~LOC~~ SOLN
0.0000 [IU] | Freq: Three times a day (TID) | SUBCUTANEOUS | Status: DC
  Administered 2016-03-14: 12:00:00 1 [IU] via SUBCUTANEOUS
  Administered 2016-03-14: 2 [IU] via SUBCUTANEOUS
  Administered 2016-03-15: 3 [IU] via SUBCUTANEOUS
  Administered 2016-03-15: 2 [IU] via SUBCUTANEOUS
  Administered 2016-03-15: 3 [IU] via SUBCUTANEOUS
  Administered 2016-03-16 (×2): 2 [IU] via SUBCUTANEOUS
  Filled 2016-03-14 (×2): qty 2
  Filled 2016-03-14: qty 3
  Filled 2016-03-14 (×2): qty 2
  Filled 2016-03-14: qty 1
  Filled 2016-03-14: qty 2
  Filled 2016-03-14: qty 3

## 2016-03-14 MED ORDER — CIPROFLOXACIN IN D5W 400 MG/200ML IV SOLN: 400.0000 mg | Freq: Two times a day (BID) | INTRAVENOUS | Status: DC

## 2016-03-14 MED ORDER — METOPROLOL TARTRATE 50 MG PO TABS
50.0000 mg | ORAL_TABLET | Freq: Two times a day (BID) | ORAL | Status: DC
  Administered 2016-03-14 – 2016-03-16 (×5): 50 mg via ORAL
  Filled 2016-03-14 (×5): qty 1

## 2016-03-14 MED ORDER — INSULIN ASPART 100 UNIT/ML ~~LOC~~ SOLN
0.0000 [IU] | Freq: Every day | SUBCUTANEOUS | Status: DC
  Administered 2016-03-14 – 2016-03-15 (×2): 2 [IU] via SUBCUTANEOUS
  Filled 2016-03-14: qty 2

## 2016-03-14 MED ORDER — PIPERACILLIN-TAZOBACTAM 3.375 G IVPB
3.3750 g | Freq: Three times a day (TID) | INTRAVENOUS | Status: DC
  Administered 2016-03-14 – 2016-03-16 (×7): 3.375 g via INTRAVENOUS
  Filled 2016-03-14 (×9): qty 50

## 2016-03-14 MED ORDER — HYDROMORPHONE HCL 1 MG/ML IJ SOLN
0.5000 mg | INTRAMUSCULAR | Status: DC | PRN
  Administered 2016-03-14 – 2016-03-16 (×8): 0.5 mg via INTRAVENOUS
  Filled 2016-03-14 (×8): qty 1

## 2016-03-14 NOTE — Care Management (Addendum)
Admitted to [facility] with the diagnosis of colitis. Lives with husband, [name] [phone]). States she last seen Dr. Kary Kos in January 2017, but received a letter in the mail in June stating he will not be seeing any more patients. Gave list of physician practices accepting new patients to [name]. No Life Alert. [facility] and [facility] in the past. No skilled nursing. No home oxygen. Rolling walker, cane, shower chair, and ramps in the home. Prescriptions are filled at [facility] in [place]. Self feed, but husband helps with bath and dressing. No falls. Decreased appetite in the last 2-3 months, 25lb weight loss. Husband and son will transport. Shelbie Ammons RN MSN CCM Care Management [phone]

## 2016-03-14 NOTE — Discharge Instructions (Signed)

## 2016-03-14 NOTE — Consult Note (Signed)
See complete note by Tammi Klippel, PA.  Pt improving slowly, colitis and cirrhosis.  C. Diff on vancomycin.  Pt with multiple serious medical conditions.  Cirrhosis with ascites, no signif leg edema.  Watch daily weights.  No new imput.

## 2016-03-14 NOTE — Clinical Social Work Note (Signed)
CSW consulted for multiple admissions and no PCP. Pt is followed by [facility]. Pt does have a PCP. RNCM is following for discharge planning needs. CSW is signing off as no further needs identified. Please reconsult if a need arises prior to discharge.   Darden Dates, MSW, LCSW Clinical Social Worker  [phone]

## 2016-03-14 NOTE — Consult Note (Signed)
GI Inpatient Consult Note  Reason for Consult: Recurrent colitis   Attending Requesting Consult: Dr. Jannifer Franklin  History of Present Illness: [name] is a 60 y.o. female with a history of sick sinus syndrome, Afib, NSTEMI, CVA, TIA, DM type I, gastroparesis, cirrhosis, and hypothyroidism admitted with colitis.  Patient presented to the [facility] ED reporting 4-5 large volume, loose BMs over a span of a few hours.  The stool was malodorous and yellow in color.  She endorsed a h/o C diff x 2, with the most recent episode last September.  She recalls receiving abx for treatment of a productive cough about 3 weeks ago.  No hematochezia or melena.  Also no fevers, chills, weight loss, or appetite changes.   Upon arrival to the ED, VSS.  Labs were notable for Hgb 10.5, MCV 77.4, platelets 133. CMP largely WNL, aside from glucose of 152.  C diff quick scan is pending.  CT a/p w/ contrast demonstrated pronounced wall thickening within the ascending colon c/w colitis; large volume ascites with paraesophageal varices also noted.  Treatment with IV Vancomycin and Zosyn was initiated, along with IV fluids, pain control, and anti-emetics.  Today, patient reports periumbilical pain and nausea persist.  She denies further vomiting or diarrhea since admission.  Patient feels the IV abx and zofran are helping quite a bit.  She notes continued abdominal distention, which she feels is compounding the abdominal discomfort.  She tolerated a small volume of clear liquid this morning and has continued with gentle oral hydration throughout the day.  No significant NSAID use or EtOH use. She denies new GI concerns at this time.  Past Medical History:  Past Medical History  Diagnosis Date  . Type 1 diabetes ([place])     on levemir  . Hypothyroid   . Degenerative disk disease   . Stomach ulcer   . Diverticulitis   . Syncope 01/2015  . Anxiety   . GERD (gastroesophageal reflux disease)   . History of hiatal hernia   .  Cancer (HCC)     HX OF CANCER OF UTERUS   . TIA (transient ischemic attack) 02/2015  . Hypertension   . Pancreatitis   . PAF (paroxysmal atrial fibrillation) ([place]) 03/2015    a. new onset 03/2015 in setting of intractable N/V; b. on Eliquis 5 mg bid; c. CHADSVASc 4 (DM, TIA x 2, female)  . Intussusception intestine ([place]) 05/2015  . Stroke [facility])     with minimal left sided weakness  . Allergy   . Cirrhosis of liver not due to alcohol ([place]) 2016  . Gastroparesis   . Sick sinus syndrome [facility])     Problem List: Patient Active Problem List   Diagnosis Date Noted  . Epigastric pain   . Abdominal pain, epigastric   . Personal history of surgery to heart and great vessels, presenting hazards to health   . Gastritis   . Foreign body in stomach   . Abnormal findings-gastrointestinal tract   . Diarrhea   . Congestive dilated cardiomyopathy ([place])   . Generalized abdominal pain   . Nausea & vomiting   . NSTEMI (non-ST elevated myocardial infarction) ([place]) 01/11/2016  . Tachyarrhythmia 01/10/2016  . Intractable nausea and vomiting 01/10/2016  . Tachy-brady syndrome ([place]) 12/28/2015  . Abdominal pain 12/27/2015  . Colitis 12/16/2015  . Pneumonia 11/14/2015  . Narcotic withdrawal ([place]) 11/11/2015  . Diabetes mellitus type 2, insulin dependent ([place]) 11/11/2015  . Orthostatic hypotension   . H/O TIA (  transient ischemic attack) and stroke   . Left-sided weakness 10/04/2015  . Expressive aphasia 10/04/2015  . Ileus ([place]) 08/01/2015  . Ascites   . Cryptogenic cirrhosis ([place]) 07/10/2015  . Paroxysmal atrial fibrillation ([place]) 07/10/2015  . C. difficile colitis 07/10/2015  . GI (gastrointestinal bleed) 07/06/2015  . Malnutrition of moderate degree 07/04/2015  . Symptomatic bradycardia 05/27/2015  . Intussusception intestine ([place])   . Hypothyroidism due to amiodarone   . Abdominal pain, chronic, epigastric   . Chronic anemia 05/19/2015  . Thrombocytopenia ([place]) 05/19/2015  . Prolonged QT  interval   . Hypomagnesemia   . Narcotic abuse   . Uncontrolled type 2 diabetes mellitus with gastroparesis ([place])   . Syncope due to orthostatic hypotension 05/18/2015  . Hypokalemia 04/06/2015  . Hyperlipidemia with target LDL less than 100 04/06/2015  . CVA (cerebral infarction) 02/15/2015  . Essential hypertension 01/12/2015  . Chronically on opiate therapy 01/12/2015  . Gastroparesis 01/12/2015  . DEPRESSION/ANXIETY 06/27/2007  . MYOFASCIAL PAIN SYNDROME 06/27/2007  . Chronic pain syndrome 03/28/2007  . GERD 03/27/2007  . DIVERTICULOSIS, COLON 03/27/2007  . LUMBAR DISC DISPLACEMENT 03/27/2007  . PROTEINURIA 03/27/2007  . UTERINE CANCER, HX OF 03/27/2007    Past Surgical History: Past Surgical History  Procedure Laterality Date  . Hernia repair    . Abdominal hysterectomy    . Cholecystectomy    . Esophagogastroduodenoscopy N/A 04/04/2015    Procedure: ESOPHAGOGASTRODUODENOSCOPY (EGD);  Surgeon: Hulen Luster, MD;  Location: [facility] ENDOSCOPY;  Service: Endoscopy;  Laterality: N/A;  . Cardiac catheterization N/A 01/12/2016    Procedure: Left Heart Cath and Coronary Angiography;  Surgeon: Wellington Hampshire, MD;  Location: [place] CV LAB;  Service: Cardiovascular;  Laterality: N/A;  . Esophagogastroduodenoscopy (egd) with propofol N/A 01/18/2016    Procedure: ESOPHAGOGASTRODUODENOSCOPY (EGD) WITH PROPOFOL;  Surgeon: Lucilla Lame, MD;  Location: [facility] ENDOSCOPY;  Service: Endoscopy;  Laterality: N/A;  . Flexible sigmoidoscopy N/A 01/18/2016    Procedure: FLEXIBLE SIGMOIDOSCOPY;  Surgeon: Lucilla Lame, MD;  Location: [facility] ENDOSCOPY;  Service: Endoscopy;  Laterality: N/A;    Allergies: Allergies  Allergen Reactions  . Hydrocodone Other (See Comments)    Pt states that this medication caused cirrhosis of the liver.    . Aspirin   . Erythromycin Other (See Comments)    Reaction:  Fever   . Prednisone Other (See Comments)    Reaction:  Unknown   . Rosiglitazone Maleate Swelling  .  Codeine Sulfate Rash  . Tetanus-Diphtheria Toxoids Td Rash and Other (See Comments)    Reaction:  Fever     Home Medications: Prescriptions prior to admission  Medication Sig Dispense Refill Last Dose  . acidophilus (RISAQUAD) CAPS capsule Take 2 capsules by mouth daily. 60 capsule 0 Unknown at Unknown  . albuterol (PROVENTIL HFA;VENTOLIN HFA) 108 (90 Base) MCG/ACT inhaler Inhale 2 puffs into the lungs every 6 (six) hours as needed for wheezing or shortness of breath. 1 Inhaler 2 prn at prn  . apixaban (ELIQUIS) 5 MG TABS tablet Take 1 tablet (5 mg total) by mouth 2 (two) times daily. 60 tablet 0 Unknown at Unknown  . atorvastatin (LIPITOR) 40 MG tablet Take 40 mg by mouth at bedtime.   Unknown at Unknown  . bismuth subsalicylate (PEPTO BISMOL) 262 MG/15ML suspension Take 30 mLs by mouth every 4 (four) hours as needed for diarrhea or loose stools. 360 mL 0 prn at prn  . budesonide-formoterol (SYMBICORT) 160-4.5 MCG/ACT inhaler Inhale 2 puffs into the lungs 2 (  two) times daily. 1 Inhaler 12 Unknown at Unknown  . cephALEXin (KEFLEX) 500 MG capsule Take 1 capsule (500 mg total) by mouth 3 (three) times daily. 21 capsule 0   . dicyclomine (BENTYL) 10 MG capsule Take 10 mg by mouth 4 (four) times daily -  before meals and at bedtime.    Unknown at Unknown  . diphenhydrAMINE (BENADRYL) 25 mg capsule Take 2 capsules (50 mg total) by mouth every 6 (six) hours as needed. 60 capsule 0   . furosemide (LASIX) 20 MG tablet Take 1 tablet (20 mg total) by mouth daily. 30 tablet 0   . insulin aspart (NOVOLOG) 100 UNIT/ML injection Inject into the skin 3 (three) times daily with meals as needed for high blood sugar. Pt uses as needed per sliding scale.   Unknown at Unknown  . insulin detemir (LEVEMIR) 100 UNIT/ML injection Inject 0.1 mLs (10 Units total) into the skin at bedtime. 10 mL 11 Unknown at Unknown  . levothyroxine (SYNTHROID, LEVOTHROID) 75 MCG tablet Take 1 tablet (75 mcg total) by mouth daily before  breakfast. 30 tablet 1 Unknown at Unknown  . metoCLOPramide (REGLAN) 10 MG tablet Take 1 tablet (10 mg total) by mouth 4 (four) times daily -  before meals and at bedtime. 60 tablet 0   . metoprolol tartrate (LOPRESSOR) 25 MG tablet Take 25 mg by mouth 2 (two) times daily.    Unknown at Unknown  . morphine (MS CONTIN) 15 MG 12 hr tablet Take 1 tablet (15 mg total) by mouth every 8 (eight) hours. 75 tablet 0 Unknown at Unknown  . ondansetron (ZOFRAN ODT) 4 MG disintegrating tablet Take 1 tablet (4 mg total) by mouth every 8 (eight) hours as needed for nausea or vomiting. 20 tablet 0   . oxyCODONE (OXY IR/ROXICODONE) 5 MG immediate release tablet Take 5-10 mg by mouth every 6 (six) hours as needed for severe pain.   Unknown at Unknown  . pantoprazole (PROTONIX) 40 MG tablet Take 40 mg by mouth 2 (two) times daily before a meal.   Unknown at Unknown  . rifaximin (XIFAXAN) 550 MG TABS tablet Take 1 tablet (550 mg total) by mouth 2 (two) times daily. 60 tablet 6 Unknown at Unknown  . spironolactone (ALDACTONE) 25 MG tablet Take 1 tablet (25 mg total) by mouth daily. 30 tablet 0   . tiZANidine (ZANAFLEX) 4 MG tablet Take 4 mg by mouth 3 (three) times daily as needed for muscle spasms.    Unknown at [place] medication reconciliation was completed with the patient.   Scheduled Inpatient Medications:   . apixaban  5 mg Oral BID  . furosemide  20 mg Oral Daily  . insulin aspart  0-5 Units Subcutaneous QHS  . insulin aspart  0-9 Units Subcutaneous TID WC  . levothyroxine  75 mcg Oral QAC breakfast  . metoprolol tartrate  50 mg Oral BID  . mometasone-formoterol  2 puff Inhalation BID  . morphine  15 mg Oral Q8H  . pantoprazole  40 mg Oral BID AC  . piperacillin-tazobactam (ZOSYN)  IV  3.375 g Intravenous Q8H  . rifaximin  550 mg Oral BID  . sodium chloride flush  3 mL Intravenous Q12H  . spironolactone  25 mg Oral Daily  . vancomycin  500 mg Oral Q6H    Continuous Inpatient Infusions:      PRN Inpatient Medications:  HYDROmorphone (DILAUDID) injection, labetalol, ondansetron (ZOFRAN) IV, prochlorperazine  Family History: family history includes CAD  in her brother and sister; Heart attack in her sister; Hypertension in her mother.    Social History:   reports that she has never smoked. She has never used smokeless tobacco. She reports that she does not drink alcohol or use illicit drugs.   Review of Systems: Constitutional: Weight is stable.  Eyes: No changes in vision. ENT: No oral lesions, sore throat.  GI: see HPI.  Heme/Lymph: No easy bruising.  CV: No chest pain.  GU: No hematuria.  Integumentary: No rashes.  Neuro: No headaches.  Psych: No depression/anxiety.  Endocrine: No heat/cold intolerance.  Allergic/Immunologic: No urticaria.  Resp: No cough, SOB.  Musculoskeletal: No joint swelling.    Physical Examination: BP 148/80 mmHg  Pulse 103  Temp(Src) 98.3 F (36.8 C) (Oral)  Resp 18  Ht 5' 3"  (1.6 m)  Wt 61.689 kg (136 lb)  BMI 24.10 kg/m2  SpO2 95% Gen: NAD, alert and oriented x 4 HEENT: PEERLA, EOMI, Neck: supple, no JVD or thyromegaly Chest: CTA bilaterally, no wheezes, crackles, or other adventitious sounds CV: RRR, no m/g/c/r Abd: soft, NT, ND, +BS in all four quadrants; no HSM, guarding, ridigity, or rebound tenderness Ext: no edema, well perfused with 2+ pulses, Skin: no rash or lesions noted Lymph: no LAD  Data: Lab Results  Component Value Date   WBC 4.6 03/14/2016   HGB 10.0* 03/14/2016   HCT 30.1* 03/14/2016   MCV 77.8* 03/14/2016   PLT 118* 03/14/2016    Recent Labs Lab 03/13/16 1643 03/14/16 0453  HGB 10.5* 10.0*   Lab Results  Component Value Date   NA 140 03/14/2016   K 3.5 03/14/2016   CL 109 03/14/2016   CO2 25 03/14/2016   BUN 11 03/14/2016   CREATININE 0.82 03/14/2016   Lab Results  Component Value Date   ALT 19 03/13/2016   AST 25 03/13/2016   ALKPHOS 104 03/13/2016   BILITOT 1.1 03/13/2016   No  results for input(s): APTT, INR, PTT in the last 168 hours.   Assessment/Plan: [name] is a 60 y.o. female with a history of sick sinus syndrome, Afib, NSTEMI, CVA, TIA, DM type I, gastroparesis, cirrhosis, and hypothyroidism admitted with colitis.  CT a/p was consistent with colitis of the ascending colon.  Treatment with IV vanc and zosyn was intinitiated, along with symptomatic management.  Patient reports slow, gradual improvement in abdominal pain and nausea.  Vomiting and diarrhea have not recurred since admission.  Recs below.  Also discussed patient with Dr. Vira Agar - see his note for additional recommendations.  Recommendations: - Carefully watch BUN and Cr due to cirrhosis w/ ascites and diuretic use - Continue IV Zosyn, fluids, antiemetics, pain control - Advance diet as tolerated - Recommend OV 1-2 weeks after discharge at [facility] GI for follow-up   Thank you for the consult. We will follow along with you. Please call with questions or concerns.  Lavera Guise, PA-C [facility] Phone: [phone] Pager: [phone]

## 2016-03-14 NOTE — Progress Notes (Signed)
Pt given IV Labetolol x1 for elevated blood pressure with improvement.

## 2016-03-14 NOTE — Progress Notes (Signed)
[place] at [place] NAME: [name]    MR#:  [id number]  DATE OF BIRTH:  10-22-1955  SUBJECTIVE:   Patient here with diarrhea and nausea. CT scan shows colitis  REVIEW OF SYSTEMS:    Review of Systems  Constitutional: Negative for fever, chills and malaise/fatigue.  HENT: Negative for ear discharge, ear pain, hearing loss, nosebleeds and sore throat.   Eyes: Negative for blurred vision and pain.  Respiratory: Negative for cough, hemoptysis, shortness of breath and wheezing.   Cardiovascular: Negative for chest pain, palpitations and leg swelling.  Gastrointestinal: Positive for nausea and diarrhea. Negative for vomiting, abdominal pain and blood in stool.  Genitourinary: Negative for dysuria.  Musculoskeletal: Negative for back pain.  Neurological: Negative for dizziness, tremors, speech change, focal weakness, seizures and headaches.  Endo/Heme/Allergies: Does not bruise/bleed easily.  Psychiatric/Behavioral: Negative for depression, suicidal ideas and hallucinations.    Tolerating Diet:Nothing by mouth      DRUG ALLERGIES:   Allergies  Allergen Reactions  . Hydrocodone Other (See Comments)    Pt states that this medication caused cirrhosis of the liver.    . Aspirin   . Erythromycin Other (See Comments)    Reaction:  Fever   . Prednisone Other (See Comments)    Reaction:  Unknown   . Rosiglitazone Maleate Swelling  . Codeine Sulfate Rash  . Tetanus-Diphtheria Toxoids Td Rash and Other (See Comments)    Reaction:  Fever     VITALS:  Blood pressure 148/80, pulse 103, temperature 98.3 F (36.8 C), temperature source Oral, resp. rate 18, height 5' 3"  (1.6 m), weight 61.689 kg (136 lb), SpO2 95 %.  PHYSICAL EXAMINATION:   Physical Exam  Constitutional: She is oriented to person, place, and time and well-developed, well-nourished, and in no distress. No distress.  HENT:  Head: Normocephalic.  Eyes: No scleral  icterus.  Neck: Normal range of motion. Neck supple. No JVD present. No tracheal deviation present.  Cardiovascular: Normal rate, regular rhythm and normal heart sounds.  Exam reveals no gallop and no friction rub.   No murmur heard. Pulmonary/Chest: Effort normal and breath sounds normal. No respiratory distress. She has no wheezes. She has no rales. She exhibits no tenderness.  Abdominal: Soft. Bowel sounds are normal. She exhibits no distension and no mass. There is tenderness. There is no rebound and no guarding.  Musculoskeletal: Normal range of motion. She exhibits no edema.  Neurological: She is alert and oriented to person, place, and time.  Tremors RLE  Skin: Skin is warm. No rash noted. No erythema.  Psychiatric: Affect and judgment normal.      LABORATORY PANEL:   CBC  Recent Labs Lab 03/14/16 0453  WBC 4.6  HGB 10.0*  HCT 30.1*  PLT 118*   ------------------------------------------------------------------------------------------------------------------  Chemistries   Recent Labs Lab 03/13/16 1643 03/14/16 0453  NA 140 140  K 3.9 3.5  CL 108 109  CO2 22 25  GLUCOSE 152* 137*  BUN 11 11  CREATININE 0.70 0.82  CALCIUM 8.8* 8.2*  AST 25  --   ALT 19  --   ALKPHOS 104  --   BILITOT 1.1  --    ------------------------------------------------------------------------------------------------------------------  Cardiac Enzymes  Recent Labs Lab 03/13/16 1643  TROPONINI <0.03   ------------------------------------------------------------------------------------------------------------------  RADIOLOGY:  Ct Abdomen Pelvis W Contrast  03/13/2016  CLINICAL DATA:  Upper abdominal pain with nausea, vomiting and diarrhea for 3 days. History of ovarian cancer. EXAM: CT  ABDOMEN AND PELVIS WITH CONTRAST TECHNIQUE: Multidetector CT imaging of the abdomen and pelvis was performed using the standard protocol following bolus administration of intravenous contrast.  CONTRAST:  151m ISOVUE-300 IOPAMIDOL (ISOVUE-300) INJECTION 61% COMPARISON:  None. FINDINGS: Lower chest:  No acute findings. Hepatobiliary: Cirrhotic-appearing liver. No focal mass or lesion identified within the liver. Patient is status post cholecystectomy. Pancreas: No mass, inflammatory changes, or other significant abnormality. Spleen: Upper normal in size. Adrenals/Urinary Tract: Irregular nonobstructive right renal stone measuring at least 1.3 cm greatest dimension. No associated hydronephrosis. No left renal stone or hydronephrosis. No ureteral or bladder calculi identified. Stomach/Bowel: No dilated large or small bowel loops. There does appear to be pronounced thickening of the walls of the ascending colon. Appendix appears grossly normal, partially obscured by the ascites. Stomach appears normal. Vascular/Lymphatic: Scattered atherosclerotic changes of the normal caliber abdominal aorta. No enlarged lymph nodes seen in the abdomen or pelvis. Reproductive: No mass or other significant abnormality. Other: Large amount of ascites throughout the abdomen and pelvis. Upper abdominal and paraesophageal varices. No free intraperitoneal air. No abscess collection seen Musculoskeletal: Mild degenerative change throughout the scoliotic thoracolumbar spine. No acute or suspicious osseous finding. Superficial soft tissues are unremarkable. IMPRESSION: 1. Cirrhotic liver. Associated upper abdominal and paraesophageal varices. Associated large volume ascites throughout the abdomen and pelvis. 2. Pronounced thickening of the walls of the majority of the ascending colon. This may be reactive thickening caused by the adjacent ascites but is more likely indicative of an acute colitis of infectious, inflammatory or ischemic nature. No associated bowel obstruction. 3. Aortic atherosclerosis. 4. Nonobstructing right renal stone. Electronically Signed   By: SFranki CabotM.D.   On: 03/13/2016 18:27     ASSESSMENT AND  PLAN:   60year old female with history of type 1 diabetes, PAF and pancreatitisWho presents with diarrhea and found have colitis on CT scan.  1. Acute colitis: Continue Zosyn. Patient has history of QTC prolongation and therefore ciprofloxacin is not recommended. Follow up on C. difficile cultures. Patient is on them. Vancomycin for now. If C. difficile is negative and this can be discontinued. Consult pending. Try clear liquid diet.  2. Accelerated essential hypertension: Increase dose of metoprolol to 50 by mouth twice a day and monitor blood pressure.  3. PAF: Continue metoprolol and Eliquis.  4. History of liver cirrhosis: Continue Xifaxan, Lasix and Aldactone  5. Hypothyroid: Continue Synthroid  6. Diabetes: Continue sliding scale insulin and request diabetes [place] consult.  Management plans discussed with the patient and she is in agreement.  CODE STATUS: DNR  TOTAL TIME TAKING CARE OF THIS PATIENT: 35 minutes.     POSSIBLE D/C 1-2 days, DEPENDING ON CLINICAL CONDITION.   Kemar Pandit M.D on 03/14/2016 at 10:55 AM  Between 7am to 6pm - Pager - [phone] After 6pm go to [email or web address] - password [email or web address]  Office  [phone] CC: Primary care physician; No PCP Per Patient  Note: This dictation was prepared with Dragon dictation along with smaller phrase technology. Any transcriptional errors that result from this process are unintentional.

## 2016-03-15 ENCOUNTER — Inpatient Hospital Stay: Payer: Commercial Managed Care - HMO

## 2016-03-15 LAB — BASIC METABOLIC PANEL
ANION GAP: 6 (ref 5–15)
BUN: 8 mg/dL (ref 6–20)
CALCIUM: 8.3 mg/dL — AB (ref 8.9–10.3)
CO2: 25 mmol/L (ref 22–32)
Chloride: 107 mmol/L (ref 101–111)
Creatinine, Ser: 0.81 mg/dL (ref 0.44–1.00)
Glucose, Bld: 158 mg/dL — ABNORMAL HIGH (ref 65–99)
POTASSIUM: 3.4 mmol/L — AB (ref 3.5–5.1)
SODIUM: 138 mmol/L (ref 135–145)

## 2016-03-15 LAB — GLUCOSE, CAPILLARY
GLUCOSE-CAPILLARY: 223 mg/dL — AB (ref 65–99)
Glucose-Capillary: 198 mg/dL — ABNORMAL HIGH (ref 65–99)
Glucose-Capillary: 204 mg/dL — ABNORMAL HIGH (ref 65–99)
Glucose-Capillary: 217 mg/dL — ABNORMAL HIGH (ref 65–99)

## 2016-03-15 LAB — C DIFFICILE QUICK SCREEN W PCR REFLEX
C DIFFICILE (CDIFF) TOXIN: NEGATIVE
C DIFFICLE (CDIFF) ANTIGEN: NEGATIVE
C Diff interpretation: NOT DETECTED

## 2016-03-15 MED ORDER — CETYLPYRIDINIUM CHLORIDE 0.05 % MT LIQD
7.0000 mL | Freq: Two times a day (BID) | OROMUCOSAL | Status: DC
  Administered 2016-03-16: 7 mL via OROMUCOSAL

## 2016-03-15 MED ORDER — CHLORHEXIDINE GLUCONATE 0.12 % MT SOLN
15.0000 mL | Freq: Two times a day (BID) | OROMUCOSAL | Status: DC
  Administered 2016-03-16: 09:00:00 15 mL via OROMUCOSAL
  Filled 2016-03-15 (×3): qty 15

## 2016-03-15 MED ORDER — POTASSIUM CHLORIDE 20 MEQ/15ML (10%) PO SOLN
40.0000 meq | Freq: Once | ORAL | Status: DC
  Filled 2016-03-15 (×2): qty 30

## 2016-03-15 MED ORDER — INSULIN DETEMIR 100 UNIT/ML ~~LOC~~ SOLN
9.0000 [IU] | Freq: Every day | SUBCUTANEOUS | Status: DC
  Administered 2016-03-15: 21:00:00 9 [IU] via SUBCUTANEOUS
  Filled 2016-03-15 (×2): qty 0.09

## 2016-03-15 MED ORDER — POTASSIUM CHLORIDE CRYS ER 20 MEQ PO TBCR: 40.0000 meq | EXTENDED_RELEASE_TABLET | Freq: Once | ORAL | Status: DC

## 2016-03-15 MED ORDER — SUCRALFATE 1 GM/10ML PO SUSP
1.0000 g | Freq: Three times a day (TID) | ORAL | Status: DC
  Administered 2016-03-15 – 2016-03-16 (×3): 1 g via ORAL
  Filled 2016-03-15 (×3): qty 10

## 2016-03-15 MED ORDER — DICYCLOMINE HCL 10 MG PO CAPS
10.0000 mg | ORAL_CAPSULE | Freq: Three times a day (TID) | ORAL | Status: DC
  Administered 2016-03-15 – 2016-03-16 (×4): 10 mg via ORAL
  Filled 2016-03-15 (×4): qty 1

## 2016-03-15 NOTE — Plan of Care (Signed)
Problem: Bowel/Gastric: Goal: Will not experience complications related to bowel motility Outcome: Progressing Pt tolerates clear liquid diet, Advanced to full liquid. C/o nausea once. Zofran given with improvement. Dilaudid given for epigastric pain with good effect. 1xloose stool mixed with urine per pt. C diff specimen not collected.

## 2016-03-15 NOTE — Progress Notes (Signed)
Took over patient care at 14:30, pt is alert and oriented, abdominal pain improved with dilaudid, nausea improved with zofran, iv zosyn infusing, on room air, up in room independently.

## 2016-03-15 NOTE — Consult Note (Signed)
   [facility] CM Inpatient Consult   03/15/2016  [name] Jan 23, 1956 [id number]   Referral received for [facility] Care Management. [place] has made multiple attempts to reach patient and had been unsuccessful. Called and spoke with [name] to verify/confirm whether she still wanted [facility] Care Management follow up. Made her aware that [place] has been trying to reach her. [name] states she does not have an answering machine. She states she has trouble sleeping at night and she usually does not lay back down until 5 to 6 am in the morning and sleeps until 12 noon or so. Requests that she be contacted after 12 noon. Confirmed best contact number as [phone]. States she and her husband share the same phone. Also discussed that she will have to find a new Primary Care MD and that she was in the hospital on June 1st when she missed her last Primary Care MD appointment. Reports transportation can be an issue for her at times.   Will request for patient to be assigned again to [place] for patient outreach. Will request for her to be called after 12 noon as she sleeps during the morning hours.  Marthenia Rolling, MSN-Ed, RN,BSN [facility] Liaison [phone]

## 2016-03-15 NOTE — Care Management Important Message (Signed)
Important Message  Patient Details  Name: [name] MRN: [id number] Date of Birth: 02-02-1956   Medicare Important Message Given:  Yes    Shelbie Ammons, RN 03/15/2016, 1:08 PM

## 2016-03-15 NOTE — Progress Notes (Signed)
Inpatient Diabetes Program Recommendations  AACE/ADA: New Consensus Statement on Inpatient Glycemic Control (2015)  Target Ranges:  Prepandial:   less than 140 mg/dL      Peak postprandial:   less than 180 mg/dL (1-2 hours)      Critically ill patients:  140 - 180 mg/dL   Lab Results  Component Value Date   GLUCAP 223* 03/15/2016   HGBA1C 8.4* 03/13/2016    Review of Glycemic Control  Results for [name], [name] (MRN [id number]) as of 03/15/2016 09:14  Ref. Range 03/14/2016 07:50 03/14/2016 11:15 03/14/2016 15:40 03/14/2016 16:44 03/14/2016 21:19 03/15/2016 07:36  Glucose-Capillary Latest Ref Range: 65-99 mg/dL 157 (H) 121 (H) 173 (H) 160 (H) 232 (H) 223 (H)    Diabetes history: Type 2 Outpatient Diabetes medications: Novolog 3 units tid, Levemir 10 units qhs (patient reports she takes 17 units) Current orders for Inpatient glycemic control: Novolog 0-9 units tid, Novolog 0-5 units qhs  Spoke with patient today- she takes 17 units of Levemir at hs and based on a sliding scale Novolog 3-5 units pre-meals. Discussed recent A1C of 8.4%  Inpatient Diabetes Program Recommendations:  Please consider starting Levemir 9 units beginning today.   [name], RN, BA, MHA, CDE Diabetes Coordinator Inpatient Diabetes Program  [phone] (Team Pager) [phone] ([place]) 03/15/2016 9:27 AM

## 2016-03-15 NOTE — Consult Note (Signed)
GI Inpatient Follow-up Note  Patient Identification: [name] is a 60 y.o. female with a history of sick sinus syndrome, Afib, NSTEMI, CVA, TIA, DM type I, gastroparesis, cirrhosis, and hypothyroidism admitted with colitis. CT a/p was consistent with colitis of the ascending colon.  Treatment with IV vanc and zosyn was intinitiated, along with IV antiemetics and pain control.  Yesterday, patient reported slow, mild, gradual improvement in epigastric pain and nausea. However, she was not tolerating liquids very well.  Vomiting and diarrhea had not recurred since admission.  The pain was to continue IV Zosyn and conservative measures, and continue to monitor symptoms.   Subjective: Today, patient reports no improvement in epigastric pain.  She feels better without food on her stomach, but the pain returns after a few sips of clear liquids.  Water is tolerated well.  She requested one dose of Zofran today because nausea became severe - she felt like she might vomit.  Zofran improved this quickly.  She also had two "runny" BMs today, but stool samples could not be obtained because she urinated while passing stool. No frank blood or melena in stools.  No new GI complaints today.  Patient also underwent a paracentesis to relieve ascites.  Her abdomen does not feel as tight as it did prior to the procedure.   Scheduled Inpatient Medications:  . antiseptic oral rinse  7 mL Mouth Rinse q12n4p  . apixaban  5 mg Oral BID  . chlorhexidine  15 mL Mouth Rinse BID  . dicyclomine  10 mg Oral TID AC & HS  . furosemide  20 mg Oral Daily  . insulin aspart  0-5 Units Subcutaneous QHS  . insulin aspart  0-9 Units Subcutaneous TID WC  . insulin detemir  9 Units Subcutaneous QHS  . levothyroxine  75 mcg Oral QAC breakfast  . metoprolol tartrate  50 mg Oral BID  . mometasone-formoterol  2 puff Inhalation BID  . morphine  15 mg Oral Q8H  . pantoprazole  40 mg Oral BID AC  . piperacillin-tazobactam (ZOSYN)   IV  3.375 g Intravenous Q8H  . potassium chloride  40 mEq Oral Once  . rifaximin  550 mg Oral BID  . sodium chloride flush  3 mL Intravenous Q12H  . spironolactone  25 mg Oral Daily  . vancomycin  500 mg Oral Q6H    Continuous Inpatient Infusions:     PRN Inpatient Medications:  HYDROmorphone (DILAUDID) injection, labetalol, ondansetron (ZOFRAN) IV, prochlorperazine  Review of Systems: Constitutional: Weight is stable.  Eyes: No changes in vision. ENT: No oral lesions, sore throat.  GI: see HPI.  Heme/Lymph: No easy bruising.  CV: No chest pain.  GU: No hematuria.  Integumentary: No rashes.  Neuro: No headaches.  Psych: No depression/anxiety.  Endocrine: No heat/cold intolerance.  Allergic/Immunologic: No urticaria.  Resp: No cough, SOB.  Musculoskeletal: No joint swelling.    Physical Examination: BP 135/60 mmHg  Pulse 83  Temp(Src) 98.5 F (36.9 C) (Oral)  Resp 20  Ht 5' 3"  (1.6 m)  Wt 61.689 kg (136 lb)  BMI 24.10 kg/m2  SpO2 98% Gen: NAD, alert and oriented x 4 HEENT: PEERLA, EOMI, Neck: supple, no JVD or thyromegaly Chest: CTA bilaterally, no wheezes, crackles, or other adventitious sounds CV: RRR, no m/g/c/r Abd: soft, moderate epigastric tenderness to light palpation, ND, +BS in all four quadrants; no HSM, guarding, ridigity, or rebound tenderness Ext: no edema, well perfused with 2+ pulses, Skin: no rash or lesions noted  Lymph: no LAD  Data: Lab Results  Component Value Date   WBC 4.6 03/14/2016   HGB 10.0* 03/14/2016   HCT 30.1* 03/14/2016   MCV 77.8* 03/14/2016   PLT 118* 03/14/2016    Recent Labs Lab 03/13/16 1643 03/14/16 0453  HGB 10.5* 10.0*   Lab Results  Component Value Date   NA 138 03/15/2016   K 3.4* 03/15/2016   CL 107 03/15/2016   CO2 25 03/15/2016   BUN 8 03/15/2016   CREATININE 0.81 03/15/2016   Lab Results  Component Value Date   ALT 19 03/13/2016   AST 25 03/13/2016   ALKPHOS 104 03/13/2016   BILITOT 1.1  03/13/2016   No results for input(s): APTT, INR, PTT in the last 168 hours.   Assessment/Plan: [name] is a 60 y.o. female with sick sinus syndrome, Afib, NSTEMI, CVA, TIA, DM type I, gastroparesis, cirrhosis, and hypothyroidism admitted with colitis.  Her biggest complaint today continues to be epigastric pain when drinking clear liquids and nausea.  Recommend beginning Carafate in addition to current GI regimen - this she reduce any stomach irritation from gastritis that may have resulted from her nausea and vomiting earlier this week.  Will continue to monitor patient - additional recs below & per Dr. Vira Agar.  Recommendations: - Begin Carafate 1g QID  - Continue Protonix 8m BID and Zofran prn - Progress diet as tolerated - Continue attempting stool sample if diarrhea persists - Will continue to monitor  Patient has been discussed with Dr. EVira Agar pending further GI recommendations at this time. Please call with questions or concerns.  MLavera Guise PA-C [facility] Phone: [phone]: [phone]

## 2016-03-15 NOTE — Progress Notes (Signed)
[place] [facility] [place] NAME: [name]    MR#:  [id number]  DATE OF BIRTH:  [birth year]  SUBJECTIVE:   Patient Unable to eat very well due to nausea and epigastric abdominal pain.  REVIEW OF SYSTEMS:    Review of Systems  Constitutional: Negative for fever, chills and malaise/fatigue.  HENT: Negative for ear discharge, ear pain, hearing loss, nosebleeds and sore throat.   Eyes: Negative for blurred vision and pain.  Respiratory: Negative for cough, hemoptysis, shortness of breath and wheezing.   Cardiovascular: Negative for chest pain, palpitations and leg swelling.  Gastrointestinal: Positive for nausea and abdominal pain. Negative for vomiting and blood in stool.  Genitourinary: Negative for dysuria.  Musculoskeletal: Negative for back pain.  Neurological: Negative for dizziness, tremors, speech change, focal weakness, seizures and headaches.  Endo/Heme/Allergies: Does not bruise/bleed easily.  Psychiatric/Behavioral: Negative for depression, suicidal ideas and hallucinations.    Tolerating Diet:Clear liquid diet     DRUG ALLERGIES:   Allergies  Allergen Reactions  . Hydrocodone Other (See Comments)    Pt states that this medication caused cirrhosis of the liver.    . Aspirin   . Erythromycin Other (See Comments)    Reaction:  Fever   . Prednisone Other (See Comments)    Reaction:  Unknown   . Rosiglitazone Maleate Swelling  . Codeine Sulfate Rash  . Tetanus-Diphtheria Toxoids Td Rash and Other (See Comments)    Reaction:  Fever     VITALS:  Blood pressure 122/57, pulse 89, temperature 99.2 F (37.3 C), temperature source Oral, resp. rate 18, height 5' 3"  (1.6 m), weight 61.689 kg (136 lb), SpO2 95 %.  PHYSICAL EXAMINATION:   Physical Exam  Constitutional: She is oriented to person, place, and time and well-developed, well-nourished, and in no distress. No distress.  HENT:  Head: Normocephalic.  Eyes: No scleral  icterus.  Neck: Normal range of motion. Neck supple. No JVD present. No tracheal deviation present.  Cardiovascular: Normal rate, regular rhythm and normal heart sounds.  Exam reveals no gallop and no friction rub.   No murmur heard. Pulmonary/Chest: Effort normal and breath sounds normal. No respiratory distress. She has no wheezes. She has no rales. She exhibits no tenderness.  Abdominal: Soft. Bowel sounds are normal. She exhibits no distension and no mass. There is tenderness. There is no rebound and no guarding.  Fluid wave  Musculoskeletal: Normal range of motion. She exhibits no edema.  Neurological: She is alert and oriented to person, place, and time.  Tremors RLE  Skin: Skin is warm. No rash noted. No erythema.  Psychiatric: Affect and judgment normal.      LABORATORY PANEL:   CBC  Recent Labs Lab 03/14/16 0453  WBC 4.6  HGB 10.0*  HCT 30.1*  PLT 118*   ------------------------------------------------------------------------------------------------------------------  Chemistries   Recent Labs Lab 03/13/16 1643  03/15/16 0558  NA 140  < > 138  K 3.9  < > 3.4*  CL 108  < > 107  CO2 22  < > 25  GLUCOSE 152*  < > 158*  BUN 11  < > 8  CREATININE 0.70  < > 0.81  CALCIUM 8.8*  < > 8.3*  AST 25  --   --   ALT 19  --   --   ALKPHOS 104  --   --   BILITOT 1.1  --   --   < > = values in this interval  not displayed. ------------------------------------------------------------------------------------------------------------------  Cardiac Enzymes  Recent Labs Lab 03/13/16 1643  TROPONINI <0.03   ------------------------------------------------------------------------------------------------------------------  RADIOLOGY:  Ct Abdomen Pelvis W Contrast  03/13/2016  CLINICAL DATA:  Upper abdominal pain with nausea, vomiting and diarrhea for 3 days. History of ovarian cancer. EXAM: CT ABDOMEN AND PELVIS WITH CONTRAST TECHNIQUE: Multidetector CT imaging of the  abdomen and pelvis was performed using the standard protocol following bolus administration of intravenous contrast. CONTRAST:  119m ISOVUE-300 IOPAMIDOL (ISOVUE-300) INJECTION 61% COMPARISON:  None. FINDINGS: Lower chest:  No acute findings. Hepatobiliary: Cirrhotic-appearing liver. No focal mass or lesion identified within the liver. Patient is status post cholecystectomy. Pancreas: No mass, inflammatory changes, or other significant abnormality. Spleen: Upper normal in size. Adrenals/Urinary Tract: Irregular nonobstructive right renal stone measuring at least 1.3 cm greatest dimension. No associated hydronephrosis. No left renal stone or hydronephrosis. No ureteral or bladder calculi identified. Stomach/Bowel: No dilated large or small bowel loops. There does appear to be pronounced thickening of the walls of the ascending colon. Appendix appears grossly normal, partially obscured by the ascites. Stomach appears normal. Vascular/Lymphatic: Scattered atherosclerotic changes of the normal caliber abdominal aorta. No enlarged lymph nodes seen in the abdomen or pelvis. Reproductive: No mass or other significant abnormality. Other: Large amount of ascites throughout the abdomen and pelvis. Upper abdominal and paraesophageal varices. No free intraperitoneal air. No abscess collection seen Musculoskeletal: Mild degenerative change throughout the scoliotic thoracolumbar spine. No acute or suspicious osseous finding. Superficial soft tissues are unremarkable. IMPRESSION: 1. Cirrhotic liver. Associated upper abdominal and paraesophageal varices. Associated large volume ascites throughout the abdomen and pelvis. 2. Pronounced thickening of the walls of the majority of the ascending colon. This may be reactive thickening caused by the adjacent ascites but is more likely indicative of an acute colitis of infectious, inflammatory or ischemic nature. No associated bowel obstruction. 3. Aortic atherosclerosis. 4. Nonobstructing  right renal stone. Electronically Signed   By: SFranki CabotM.D.   On: 03/13/2016 18:27     ASSESSMENT AND PLAN:   60year old female with history of type 1 diabetes, PAF and pancreatitisWho presents with diarrhea and found have colitis on CT scan.  1. Acute colitis: Continue Zosyn. Patient has history of QTC prolongation and therefore ciprofloxacin is not recommended. Patient has had no diarrhea since hospitalization. She is empirically on vancomycin for history of C. difficile.  Appreciate GI consult.  Try clear liquid diet.  2. Accelerated essential hypertension: Blood pressure is acceptable today. Continue metoprolol  3. PAF: Continue metoprolol and Eliquis.  4. History of liver cirrhosis: Order paracentesis which may help alleviate patient's abdominal pain and nausea.  Continue Xifaxan, Lasix and Aldactone  5. Hypothyroid: Continue Synthroid  6. Diabetes: Continue sliding scale insulin start Levemir 9 units per diabetes coordinator. 7. Hypokalemia: Replete and recheck in a.m.  Management plans discussed with the patient and she is in agreement.  CODE STATUS: DNR  TOTAL TIME TAKING CARE OF THIS PATIENT: 25 minutes.     POSSIBLE D/C 1-2 days, DEPENDING ON CLINICAL CONDITION.   Aislee Landgren M.D on 03/15/2016 at 10:56 AM  Between 7am to 6pm - Pager - [phone] After 6pm go to [email or web address] - password [email or web address]  Office  [phone] CC: Primary care physician; No PCP Per Patient  Note: This dictation was prepared with Dragon dictation along with smaller phrase technology. Any transcriptional errors that result from this process are unintentional.

## 2016-03-16 LAB — COMPREHENSIVE METABOLIC PANEL
ALBUMIN: 3.1 g/dL — AB (ref 3.5–5.0)
ALK PHOS: 87 U/L (ref 38–126)
ALT: 16 U/L (ref 14–54)
ANION GAP: 6 (ref 5–15)
AST: 17 U/L (ref 15–41)
BUN: 6 mg/dL (ref 6–20)
CALCIUM: 8.5 mg/dL — AB (ref 8.9–10.3)
CO2: 27 mmol/L (ref 22–32)
Chloride: 105 mmol/L (ref 101–111)
Creatinine, Ser: 0.7 mg/dL (ref 0.44–1.00)
GFR calc non Af Amer: >60 mL/min (ref >60)
GLUCOSE: 212 mg/dL — AB (ref 65–99)
POTASSIUM: 3.5 mmol/L (ref 3.5–5.1)
SODIUM: 138 mmol/L (ref 135–145)
Total Bilirubin: 0.8 mg/dL (ref 0.3–1.2)
Total Protein: 5.8 g/dL — ABNORMAL LOW (ref 6.5–8.1)

## 2016-03-16 LAB — GLUCOSE, CAPILLARY
GLUCOSE-CAPILLARY: 190 mg/dL — AB (ref 65–99)
Glucose-Capillary: 201 mg/dL — ABNORMAL HIGH (ref 65–99)

## 2016-03-16 MED ORDER — HYDROMORPHONE HCL 1 MG/ML IJ SOLN
0.5000 mg | Freq: Once | INTRAMUSCULAR | Status: AC
  Administered 2016-03-16: 0.5 mg via INTRAVENOUS
  Filled 2016-03-16: qty 1

## 2016-03-16 MED ORDER — SUCRALFATE 1 GM/10ML PO SUSP: 1.0000 g | Freq: Three times a day (TID) | ORAL | Status: DC

## 2016-03-16 MED ORDER — AMOXICILLIN-POT CLAVULANATE 875-125 MG PO TABS: 1.0000 | ORAL_TABLET | Freq: Two times a day (BID) | ORAL | Status: DC

## 2016-03-16 NOTE — Discharge Summary (Signed)
[place] [facility] [place] NAME: [name]    MR#:  [id number]  DATE OF BIRTH:  March 30, 1956  DATE OF ADMISSION:  03/13/2016 ADMITTING PHYSICIAN: Lance Coon, MD  DATE OF DISCHARGE: 03/16/2016  PRIMARY CARE PHYSICIAN: No PCP Per [name]    ADMISSION DIAGNOSIS:  Colitis [K52.9] Generalized abdominal pain [R10.84]  DISCHARGE DIAGNOSIS:  Principal Problem:   Colitis Active Problems:   GERD   Hyperlipidemia with target LDL less than 100   Uncontrolled type 2 diabetes mellitus with gastroparesis (HCC)   Hypothyroidism due to amiodarone   Cryptogenic cirrhosis (HCC)   Paroxysmal atrial fibrillation ([place])   SECONDARY DIAGNOSIS:   Past Medical History  Diagnosis Date  . Type 1 diabetes ([place])     on levemir  . Hypothyroid   . Degenerative disk disease   . Stomach ulcer   . Diverticulitis   . Syncope 01/2015  . Anxiety   . GERD (gastroesophageal reflux disease)   . History of hiatal hernia   . Cancer (HCC)     HX OF CANCER OF UTERUS   . TIA (transient ischemic attack) 02/2015  . Hypertension   . Pancreatitis   . PAF (paroxysmal atrial fibrillation) ([place]) 03/2015    a. new onset 03/2015 in setting of intractable N/V; b. on Eliquis 5 mg bid; c. CHADSVASc 4 (DM, TIA x 2, female)  . Intussusception intestine ([place]) 05/2015  . Stroke [facility])     with minimal left sided weakness  . Allergy   . Cirrhosis of liver not due to alcohol ([place]) 2016  . Gastroparesis   . Sick sinus syndrome [facility])     HOSPITAL COURSE:   60 year old female with history of type 1 diabetes, PAF and pancreatitis who presents with diarrhea and found have colitis on CT scan.  1. Acute colitis: Continue Zosyn. [name] has history of QTC prolongation and therefore ciprofloxacin is not recommended. C. difficile is negative. Vancomycin has been discontinued. Diarrhea has improved.  2. Nausea: [name] may have diabetes gastroparesis, however due to history of prolonged QTC  would not recommend starting Reglan. Continue PPI and Carafate.  3 essential hypertension: Continue metoprolol  4. PAF: Continue metoprolol and Eliquis.  5. History of liver cirrhosis: [name] is status post paracentesis with 2 L removed. Continue Xifaxan, Lasix and Aldactone  6. Hypothyroid: Continue Synthroid  7. Diabetes: Continue sliding scale insulin Levemir.  8. Hypokalemia: Improved.    DISCHARGE CONDITIONS AND DIET:   Stable DM diet  CONSULTS OBTAINED:  Treatment Team:  Manya Silvas, MD  DRUG ALLERGIES:   Allergies  Allergen Reactions  . Hydrocodone Other (See Comments)    Pt states that this medication caused cirrhosis of Rita liver.    . Aspirin   . Erythromycin Other (See Comments)    Reaction:  Fever   . Prednisone Other (See Comments)    Reaction:  Unknown   . Rosiglitazone Maleate Swelling  . Codeine Sulfate Rash  . Tetanus-Diphtheria Toxoids Td Rash and Other (See Comments)    Reaction:  Fever     DISCHARGE MEDICATIONS:   Current Discharge Medication List    START taking these medications   Details  amoxicillin-clavulanate (AUGMENTIN) 875-125 MG tablet Take 1 tablet by mouth 2 (two) times daily. Qty: 16 tablet, Refills: 0    sucralfate (CARAFATE) 1 GM/10ML suspension Take 10 mLs (1 g total) by mouth 4 (four) times daily -  before meals and at bedtime. Qty: 420 mL, Refills:  0      CONTINUE these medications which have NOT CHANGED   Details  acidophilus (RISAQUAD) CAPS capsule Take 2 capsules by mouth daily. Qty: 60 capsule, Refills: 0    albuterol (PROVENTIL HFA;VENTOLIN HFA) 108 (90 Base) MCG/ACT inhaler Inhale 2 puffs into Rita lungs every 6 (six) hours as needed for wheezing or shortness of breath. Qty: 1 Inhaler, Refills: 2    apixaban (ELIQUIS) 5 MG TABS tablet Take 1 tablet (5 mg total) by mouth 2 (two) times daily. Qty: 60 tablet, Refills: 0    atorvastatin (LIPITOR) 40 MG tablet Take 40 mg by mouth at bedtime.    bismuth  subsalicylate (PEPTO BISMOL) 262 MG/15ML suspension Take 30 mLs by mouth every 4 (four) hours as needed for diarrhea or loose stools. Qty: 360 mL, Refills: 0    budesonide-formoterol (SYMBICORT) 160-4.5 MCG/ACT inhaler Inhale 2 puffs into Rita lungs 2 (two) times daily. Qty: 1 Inhaler, Refills: 12    dicyclomine (BENTYL) 10 MG capsule Take 10 mg by mouth 4 (four) times daily -  before meals and at bedtime.     diphenhydrAMINE (BENADRYL) 25 mg capsule Take 2 capsules (50 mg total) by mouth every 6 (six) hours as needed. Qty: 60 capsule, Refills: 0    furosemide (LASIX) 20 MG tablet Take 1 tablet (20 mg total) by mouth daily. Qty: 30 tablet, Refills: 0    insulin aspart (NOVOLOG) 100 UNIT/ML injection Inject into Rita skin 3 (three) times daily with meals as needed for high blood sugar. Pt uses as needed per sliding scale.    insulin detemir (LEVEMIR) 100 UNIT/ML injection Inject 0.1 mLs (10 Units total) into Rita skin at bedtime. Qty: 10 mL, Refills: 11    levothyroxine (SYNTHROID, LEVOTHROID) 75 MCG tablet Take 1 tablet (75 mcg total) by mouth daily before breakfast. Qty: 30 tablet, Refills: 1    metoCLOPramide (REGLAN) 10 MG tablet Take 1 tablet (10 mg total) by mouth 4 (four) times daily -  before meals and at bedtime. Qty: 60 tablet, Refills: 0    metoprolol tartrate (LOPRESSOR) 25 MG tablet Take 25 mg by mouth 2 (two) times daily.     morphine (MS CONTIN) 15 MG 12 hr tablet Take 1 tablet (15 mg total) by mouth every 8 (eight) hours. Qty: 75 tablet, Refills: 0    ondansetron (ZOFRAN ODT) 4 MG disintegrating tablet Take 1 tablet (4 mg total) by mouth every 8 (eight) hours as needed for nausea or vomiting. Qty: 20 tablet, Refills: 0    oxyCODONE (OXY IR/ROXICODONE) 5 MG immediate release tablet Take 5-10 mg by mouth every 6 (six) hours as needed for severe pain.    pantoprazole (PROTONIX) 40 MG tablet Take 40 mg by mouth 2 (two) times daily before a meal.    rifaximin (XIFAXAN) 550  MG TABS tablet Take 1 tablet (550 mg total) by mouth 2 (two) times daily. Qty: 60 tablet, Refills: 6    spironolactone (ALDACTONE) 25 MG tablet Take 1 tablet (25 mg total) by mouth daily. Qty: 30 tablet, Refills: 0    tiZANidine (ZANAFLEX) 4 MG tablet Take 4 mg by mouth 3 (three) times daily as needed for muscle spasms.       STOP taking these medications     cephALEXin (KEFLEX) 500 MG capsule               Today   CHIEF COMPLAINT:  still with some nausea but overall improved   VITAL SIGNS:  Blood pressure  132/64, pulse 78, temperature 98.2 F (36.8 C), temperature source Oral, resp. rate 18, height 5' 3"  (1.6 m), weight 61.689 kg (136 lb), SpO2 95 %.   REVIEW OF SYSTEMS:  Review of Systems  Constitutional: Negative for fever, chills and malaise/fatigue.  HENT: Negative for ear discharge, ear pain, hearing loss, nosebleeds and sore throat.   Eyes: Negative for blurred vision and pain.  Respiratory: Negative for cough, hemoptysis, shortness of breath and wheezing.   Cardiovascular: Negative for chest pain, palpitations and leg swelling.  Gastrointestinal: Positive for nausea. Negative for vomiting, abdominal pain, diarrhea and blood in stool.  Genitourinary: Negative for dysuria.  Musculoskeletal: Negative for back pain.  Neurological: Negative for dizziness, tremors, speech change, focal weakness, seizures and headaches.  Endo/Heme/Allergies: Does not bruise/bleed easily.  Psychiatric/Behavioral: Negative for depression, suicidal ideas and hallucinations.     PHYSICAL EXAMINATION:   GENERAL:  60 y.o.-year-old [name] lying in Rita bed with no acute distress.  NECK:  Supple, no jugular venous distention. No thyroid enlargement, no tenderness.  LUNGS: Normal breath sounds bilaterally, no wheezing, rales,rhonchi  No use of accessory muscles of respiration.  CARDIOVASCULAR: S1, S2 normal. No murmurs, rubs, or gallops.  ABDOMEN: Soft, non-tender, non-distended. Bowel  sounds present. No organomegaly or mass.  EXTREMITIES: No pedal edema, cyanosis, or clubbing.  PSYCHIATRIC: [name] is alert and oriented x 3.  SKIN: No obvious rash, lesion, or ulcer.   DATA REVIEW:   CBC  Recent Labs Lab 03/14/16 0453  WBC 4.6  HGB 10.0*  HCT 30.1*  PLT 118*    Chemistries   Recent Labs Lab 03/16/16 0442  NA 138  K 3.5  CL 105  CO2 27  GLUCOSE 212*  BUN 6  CREATININE 0.70  CALCIUM 8.5*  AST 17  ALT 16  ALKPHOS 87  BILITOT 0.8    Cardiac Enzymes  Recent Labs Lab 03/13/16 1643  TROPONINI <0.03    Microbiology Results  @[email or web address] @  RADIOLOGY:  US Paracentesis  03/15/2016  INDICATION: 60 year old female with a history of recurrent ascites EXAM: ULTRASOUND GUIDED  PARACENTESIS MEDICATIONS: None. COMPLICATIONS: None PROCEDURE: Informed written consent was obtained from [name] after a discussion of Rita risks, benefits and alternatives to treatment. A timeout was performed prior to Rita initiation of Rita procedure. Initial ultrasound scanning demonstrates a moderate amount of ascites within Rita right lower abdominal quadrant. Rita right lower abdomen was prepped and draped in Rita usual sterile fashion. 1% lidocaine with epinephrine was used for local anesthesia. Following this, a Safe-T-Centesis catheter was introduced. An ultrasound image was saved for documentation purposes. Rita paracentesis was performed. Rita catheter was removed and a dressing was applied. [name] tolerated Rita procedure well without immediate post procedural complication. FINDINGS: A total of approximately 2.0 L of thin yellow fluid was removed. IMPRESSION: Status post ultrasound-guided paracentesis with 2.0 L of thin yellow fluid removed. Signed, [name]. [name], DO [facility] Radiology Electronically Signed   By: [name] D.O.   On: 03/15/2016 11:44      Management plans discussed with [name] and [name] is in  agreement. Stable for discharge home  [name] should follow up with PCP in 1 week  CODE STATUS:     Code Status Orders        Start     Ordered   03/13/16 2126  Do not attempt resuscitation (DNR)   Continuous    Question Answer Comment  In Rita event of cardiac  or respiratory ARREST Do not call a "code blue"   In Rita event of cardiac or respiratory ARREST Do not perform Intubation, CPR, defibrillation or ACLS   In Rita event of cardiac or respiratory ARREST Use medication by any route, position, wound care, and other measures to relive pain and suffering. May use oxygen, suction and manual treatment of airway obstruction as needed for comfort.      03/13/16 2125    Code Status History    Date Active Date Inactive Code Status Order ID Comments User Context   02/04/2016  9:39 AM 02/07/2016  2:59 PM DNR [id number]  Saundra Shelling, MD Inpatient   01/21/2016 10:24 PM 01/25/2016  5:02 PM DNR [id number]  Lance Coon, MD Inpatient   01/15/2016  1:39 PM 01/19/2016  3:29 PM DNR [id number]  Gladstone Lighter, MD ED   01/10/2016 10:00 PM 01/13/2016  5:24 PM DNR [id number]  Harrie Foreman, MD Inpatient   12/27/2015 11:02 PM 01/01/2016  6:17 PM DNR [id number]  Max Sane, MD Inpatient   12/16/2015  4:54 PM 12/19/2015  4:52 PM DNR [id number]  Loletha Grayer, MD ED   11/11/2015 11:36 AM 11/16/2015  2:05 PM Full Code [id number]  Samella Parr, NP Inpatient   10/15/2015  1:23 AM 10/16/2015  6:17 PM Full Code [id number]  Lance Coon, MD Inpatient   10/04/2015  8:19 PM 10/08/2015  6:34 PM Full Code [id number]  Bethena Roys, MD Inpatient   08/15/2015  1:05 AM 08/16/2015  8:04 PM Full Code [id number]  Harrie Foreman, MD Inpatient   08/01/2015  1:25 AM 08/06/2015  5:41 PM Full Code [id number]  Harrie Foreman, MD Inpatient   07/04/2015  2:53 AM 07/11/2015  6:33 PM Full Code [id number]  Lytle Butte, MD ED   05/27/2015 10:48 PM 05/31/2015  2:37 PM Full Code [id number]  Rise Patience, MD Inpatient   05/19/2015  1:14 AM  05/22/2015  4:28 PM Full Code [id number]  Rise Patience, MD Inpatient   04/05/2015  9:16 AM 04/07/2015  5:23 PM Full Code [id number]  Bettey Costa, MD Inpatient   04/03/2015 12:31 PM 04/05/2015  9:16 AM Full Code [id number]  Bettey Costa, MD Inpatient   03/11/2015  4:49 PM 03/14/2015  3:50 PM Full Code [id number]  Dustin Flock, MD Inpatient   02/15/2015  4:51 PM 02/17/2015  2:33 PM DNR [id number]  Dustin Flock, MD Inpatient   02/15/2015  4:35 PM 02/15/2015  4:51 PM Full Code [id number]  Dustin Flock, MD Inpatient   01/11/2015  2:40 AM 01/12/2015  6:11 PM Full Code [id number]  Etta Quill, DO ED      TOTAL TIME TAKING CARE OF THIS [name]: 35 minutes.    Note: This dictation was prepared with Dragon dictation along with smaller phrase technology. Any transcriptional errors that result from this process are unintentional.  Ambera Fedele M.D on 03/16/2016 at 11:29 AM  Between 7am to 6pm - Pager - [phone] After 6pm go to [email or web address] - password [email or web address] [place] [facility]  Office  [phone]  CC: Primary care physician; No PCP Per [name]

## 2016-03-16 NOTE — Progress Notes (Signed)
[place] at [place] NAME: [name]    MR#:  [id number]  DATE OF BIRTH:  1956/01/09  SUBJECTIVE:   Patient symptoms have improved since admission. She reports some nausea at times when eating. She denies abdominal pain or diarrhea.  REVIEW OF SYSTEMS:    Review of Systems  Constitutional: Negative for fever, chills and malaise/fatigue.  HENT: Negative for ear discharge, ear pain, hearing loss, nosebleeds and sore throat.   Eyes: Negative for blurred vision and pain.  Respiratory: Negative for cough, hemoptysis, shortness of breath and wheezing.   Cardiovascular: Negative for chest pain, palpitations and leg swelling.  Gastrointestinal: Positive for nausea. Negative for vomiting, abdominal pain and blood in stool.  Genitourinary: Negative for dysuria.  Musculoskeletal: Negative for back pain.  Neurological: Negative for dizziness, tremors, speech change, focal weakness, seizures and headaches.  Endo/Heme/Allergies: Does not bruise/bleed easily.  Psychiatric/Behavioral: Negative for depression, suicidal ideas and hallucinations.    Tolerating Diet:Clear liquid diet     DRUG ALLERGIES:   Allergies  Allergen Reactions  . Hydrocodone Other (See Comments)    Pt states that this medication caused cirrhosis of the liver.    . Aspirin   . Erythromycin Other (See Comments)    Reaction:  Fever   . Prednisone Other (See Comments)    Reaction:  Unknown   . Rosiglitazone Maleate Swelling  . Codeine Sulfate Rash  . Tetanus-Diphtheria Toxoids Td Rash and Other (See Comments)    Reaction:  Fever     VITALS:  Blood pressure 132/64, pulse 78, temperature 98.2 F (36.8 C), temperature source Oral, resp. rate 18, height 5' 3"  (1.6 m), weight 61.689 kg (136 lb), SpO2 95 %.  PHYSICAL EXAMINATION:   Physical Exam  Constitutional: She is oriented to person, place, and time and well-developed, well-nourished, and in no distress. No distress.   HENT:  Head: Normocephalic.  Eyes: No scleral icterus.  Neck: Normal range of motion. Neck supple. No JVD present. No tracheal deviation present.  Cardiovascular: Normal rate, regular rhythm and normal heart sounds.  Exam reveals no gallop and no friction rub.   No murmur heard. Pulmonary/Chest: Effort normal and breath sounds normal. No respiratory distress. She has no wheezes. She has no rales. She exhibits no tenderness.  Abdominal: Soft. Bowel sounds are normal. She exhibits no distension and no mass. There is no rebound and no guarding.  Musculoskeletal: Normal range of motion. She exhibits no edema.  Neurological: She is alert and oriented to person, place, and time.  Tremors RLE  Skin: Skin is warm. No rash noted. No erythema.  Psychiatric: Affect and judgment normal.      LABORATORY PANEL:   CBC  Recent Labs Lab 03/14/16 0453  WBC 4.6  HGB 10.0*  HCT 30.1*  PLT 118*   ------------------------------------------------------------------------------------------------------------------  Chemistries   Recent Labs Lab 03/16/16 0442  NA 138  K 3.5  CL 105  CO2 27  GLUCOSE 212*  BUN 6  CREATININE 0.70  CALCIUM 8.5*  AST 17  ALT 16  ALKPHOS 87  BILITOT 0.8   ------------------------------------------------------------------------------------------------------------------  Cardiac Enzymes  Recent Labs Lab 03/13/16 1643  TROPONINI <0.03   ------------------------------------------------------------------------------------------------------------------  RADIOLOGY:  US Paracentesis  03/15/2016  INDICATION: 60 year old female with a history of recurrent ascites EXAM: ULTRASOUND GUIDED  PARACENTESIS MEDICATIONS: None. COMPLICATIONS: None PROCEDURE: Informed written consent was obtained from the patient after a discussion of the risks, benefits and alternatives to treatment. A timeout was  performed prior to the initiation of the procedure. Initial ultrasound  scanning demonstrates a moderate amount of ascites within the right lower abdominal quadrant. The right lower abdomen was prepped and draped in the usual sterile fashion. 1% lidocaine with epinephrine was used for local anesthesia. Following this, a Safe-T-Centesis catheter was introduced. An ultrasound image was saved for documentation purposes. The paracentesis was performed. The catheter was removed and a dressing was applied. The patient tolerated the procedure well without immediate post procedural complication. FINDINGS: A total of approximately 2.0 L of thin yellow fluid was removed. IMPRESSION: Status post ultrasound-guided paracentesis with 2.0 L of thin yellow fluid removed. Signed, [name]. [name], DO [facility] Radiology Electronically Signed   By: [name] D.O.   On: 03/15/2016 11:44     ASSESSMENT AND PLAN:   60 year old female with history of type 1 diabetes, PAF and pancreatitisWho presents with diarrhea and found have colitis on CT scan.  1. Acute colitis: Continue Zosyn. Patient has history of QTC prolongation and therefore ciprofloxacin is not recommended. C. difficile is negative. Vancomycin has been discontinued. Diarrhea has improved.  2. Nausea: Patient may have diabetes gastroparesis, however due to history of prolonged QTC would not recommend starting Reglan. Continue PPI and Carafate.  3  essential hypertension: Continue metoprolol  4. PAF: Continue metoprolol and Eliquis.  5. History of liver cirrhosis: She is status post paracentesis with 2 L removed. Continue Xifaxan, Lasix and Aldactone  6. Hypothyroid: Continue Synthroid  7. Diabetes: Continue sliding scale insulin  Levemir.  8. Hypokalemia: Improved.  Management plans discussed with the patient and she is in agreement.  CODE STATUS: DNR  TOTAL TIME TAKING CARE OF THIS PATIENT: 25 minutes.     POSSIBLE D/C today, DEPENDING ON CLINICAL  CONDITION.   [name] M.D on 03/16/2016 at 11:21 AM  Between 7am to 6pm - Pager - [phone] After 6pm go to [email or web address] - password [email or web address] [place] [facility]  Office  [phone]  CC: Primary care physician; No PCP Per Patient  Note: This dictation was prepared with Dragon dictation along with smaller phrase technology. Any transcriptional errors that result from this process are unintentional.

## 2016-03-16 NOTE — Care Management (Signed)
Discharge to home today per Dr. Benjie Karvonen. Home Health ordered. Spoke with [name]. States that she "will be all right with no one coming to her home." States she has a husband and 2 sons that can help her out, if needed. Encouraged calling the physician list for a primary care physician. Family will transport. Shelbie Ammons RN MSN CCM Care Management [phone]

## 2016-03-16 NOTE — Progress Notes (Signed)
Pt being discharged today, IV X1 removed. Pt's belongings were returned to her. Discharge instructions explained to pt, she verified understanding. Pt currently waiting on her ride home. She will be wheeled out in a wheelchair by staff.

## 2016-03-19 ENCOUNTER — Other Ambulatory Visit: Payer: Self-pay | Admitting: *Deleted

## 2016-03-19 NOTE — Patient Outreach (Signed)
[facility]) Care Management [place] Telephone Outreach, Transition of Care Attempt #1 03/19/2016  [name] Mar 27, 1956 [id number]  Unsuccessful telephone outreach to Ms. [name], 60 y/o female referred to [place] after recent IP hospital visit July 4-7, 2017, for ongoing abdominal pain and colitis.  This is patient's 10th IP hospital admission in 2017.  Called patient after 12:00 noon, as she had requested during her hospitalization.  Person that answered phone during telephone outreach stated, "she is not available right now, I will have her give you a call later," and hung the phone up.  I was unable to provide my direct contact information to the person, nor the general purpose of my call.  Plan:  Will re-attempt telephone outreach to patient later this week, if patient does not return my call within 24 hours.  [name], RN, BSN, [employer] [facility] Care Management  [phone]

## 2016-03-20 IMAGING — RF DG UGI W/ KUB
3 series · 5 of 5 positions shown · non-contrast
Comparison: Gastric emptying study July 21, 2013

CLINICAL DATA: History of previous anti reflux procedure in 0777
now with symptoms of mid chest and epigastric pain with sensation of
choking after eating all foods ; difficulty burping

EXAM:
UPPER GI SERIES WITHOUT KUB
TECHNIQUE: Routine upper GI series with barium swallow was performed with thin
and high-density barium with gas-forming crystals. A barium tablet
was administered as well.
FLUOROSCOPY TIME:  2 min, 24 seconds

[Series 1: fluoro_barium 2fps_bw · 0.19mm/px · 2 of 2 frames shown (1 of 3)]
[frame 1/2]
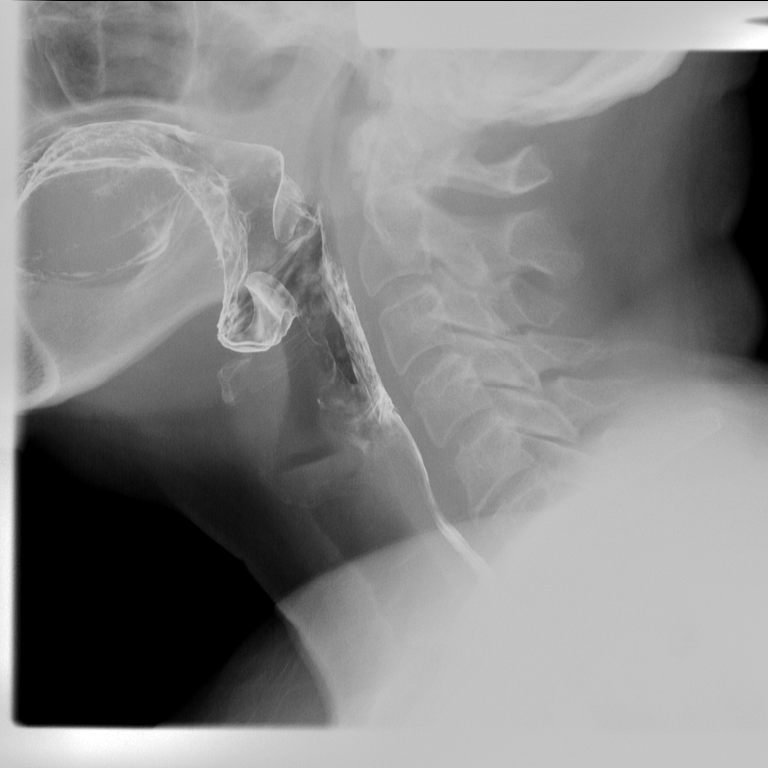
[frame 2/2]
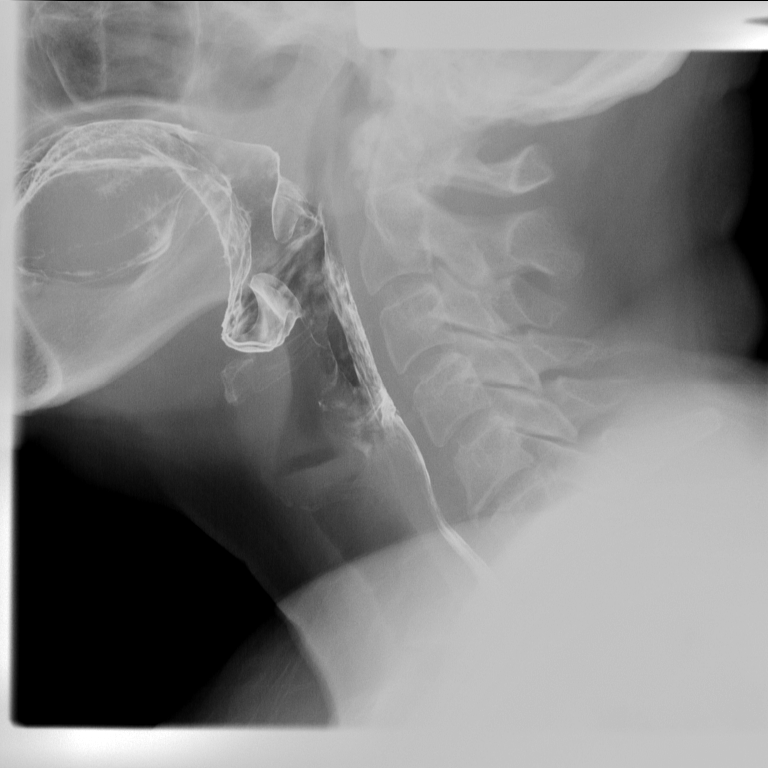

[Series 2: fluoro_barium 2fps_bw · 0.19mm/px · 1 of 1 slices shown (2 of 3)]
[im 1/1]
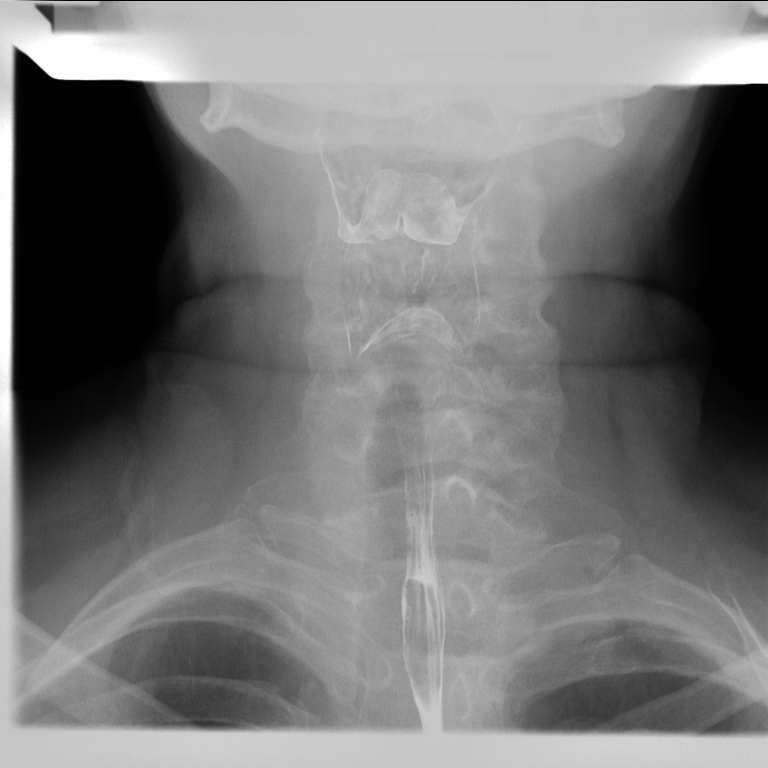

[Series 3: fluoro_barium 2fps_bw · 0.19mm/px · 2 of 2 frames shown (3 of 3)]
[frame 1/2]
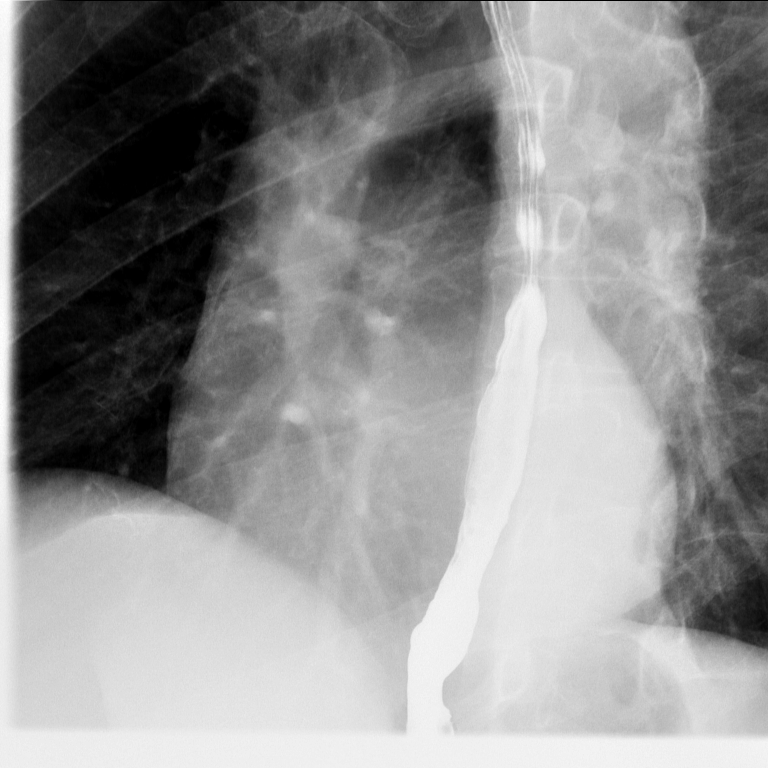
[frame 2/2]
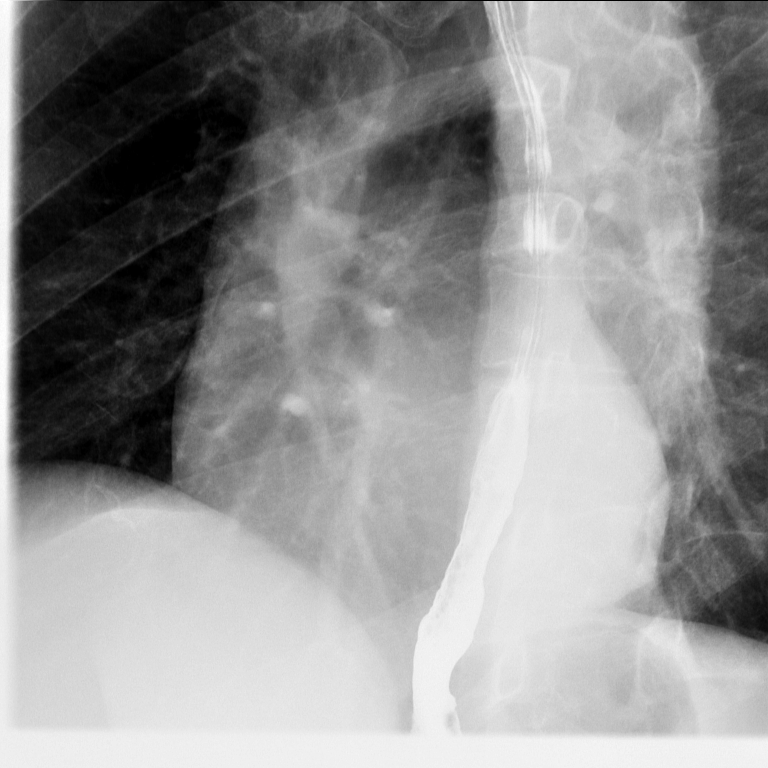

[5 of 5 positions shown; findings below may reference images not displayed]

FINDINGS: The patient ingested the thick and thin barium and gas forming
crystals without difficulty. The cervical esophagus distended well.
There was no laryngeal penetration of the barium. The thoracic
esophagus also distended well. There was no evidence of esophagitis.
No reflux was observed.

The thin barium passed into the stomach without difficulty. When the
patient ingested the gas-forming crystals with subsequent mild
gastric distention the patient reported discomfort similar to that
that she experiences at home. The patient was unable to voluntarily
burp to relieve any of the pressure. The wrap was demonstrated and
was grossly normal although it would not allow passage of the barium
tablet.

The gastric mucosal fold pattern was normal. The duodenal bulb and C
sweep were normal. Gastric emptying appeared prompt.
IMPRESSION: 1. The cervical and thoracic esophagus exhibit no acute
abnormalities.
2. The anti reflux esophageal wrap is obstructive to passage of the
barium tablet. The patient was also unable to voluntarily burp. This
may indicate that the wrap is now too tight. Direct visualization is
recommended and reportedly is planned for [DATE]. The stomach and duodenum are normal in appearance.

## 2016-03-21 ENCOUNTER — Other Ambulatory Visit: Payer: Self-pay | Admitting: *Deleted

## 2016-03-21 NOTE — Patient Outreach (Signed)
[facility]) Care Management [place] Telephone Outreach, Transition of Care, attempt #2 03/21/2016  [name] [birth year] [id number]  Unsuccessful telephone outreach to [name], 60 y/o female referred to [place] after recent IP hospital visit July 4-7, 2017, for ongoing abdominal pain and colitis. This is patient's 10th IP hospital admission in 2017.  Called patient after 12:00 noon at 3:25 pm, as she had requested during her hospitalization. Person that answered phone during telephone outreach stated, "she is not available right now, I will have her give you a call later."  With this call attempt, I was able to provide my direct contact information to the person who answered the phone.  Plan:  Will re-attempt telephone outreach to patient later this week, if patient does not return my call within 24 hours.  [name], RN, BSN, [employer] [facility] Care Management  [phone]

## 2016-03-23 ENCOUNTER — Encounter: Payer: Self-pay | Admitting: *Deleted

## 2016-03-23 ENCOUNTER — Other Ambulatory Visit: Payer: Self-pay | Admitting: *Deleted

## 2016-03-23 NOTE — Patient Outreach (Addendum)
[facility]) Care Management [place] Telephone Outreach, Transition of Care attempt #3 03/23/2016  AULANI SHIPTON 1956-09-01 [id number]  Unsuccessful telephone outreach to Ms. Lyniah Fujita, 60 y/o female referred to [place] after recent IP hospital visit July 4-7, 2017, for ongoing abdominal pain and colitis. This is patient's 10th IP hospital admission in 2017.  Called patient after 12:00 noon at 1:25 pm, as she had requested during her hospitalization. Person that answered phone during telephone outreach stated, "she is not available right now, I will let her know you have called again today."  I explained that I would be putting a letter in the mail to [name], and made this person aware of my general upcoming schedule, should Ms. Wahlert wish to contact [facility] CM in the future.  Plan:  Will put case closure letter in mail to patient as unable to contact and will close [place] episode if I do not hear back from her within 10 business days.  Addendum:  [facility] CM Episode resolved on 04/08/16, as I did not hear back from patient within 10 business days after sending case closure letter on 03/23/16.  **NOTE: patient has no PCP listed to send notification of case closure to**/ LMT   [name], RN, BSN, [place] Coordinator [facility] Care Management  [phone]

## 2016-04-06 ENCOUNTER — Encounter: Payer: Self-pay | Admitting: Emergency Medicine

## 2016-04-06 ENCOUNTER — Observation Stay
Admission: EM | Admit: 2016-04-06 | Discharge: 2016-04-09 | Disposition: A | Discharge status: Home / Self Care | Payer: Commercial Managed Care - HMO | Attending: Internal Medicine | Admitting: Internal Medicine

## 2016-04-06 DIAGNOSIS — F419 Anxiety disorder, unspecified: Secondary | ICD-10-CM | POA: Insufficient documentation

## 2016-04-06 DIAGNOSIS — Z90710 Acquired absence of both cervix and uterus: Secondary | ICD-10-CM | POA: Insufficient documentation

## 2016-04-06 DIAGNOSIS — E1043 Type 1 diabetes mellitus with diabetic autonomic (poly)neuropathy: Principal | ICD-10-CM | POA: Insufficient documentation

## 2016-04-06 DIAGNOSIS — R1110 Vomiting, unspecified: Secondary | ICD-10-CM | POA: Diagnosis present

## 2016-04-06 DIAGNOSIS — K3184 Gastroparesis: Secondary | ICD-10-CM | POA: Diagnosis not present

## 2016-04-06 DIAGNOSIS — Z7901 Long term (current) use of anticoagulants: Secondary | ICD-10-CM | POA: Insufficient documentation

## 2016-04-06 DIAGNOSIS — K2970 Gastritis, unspecified, without bleeding: Secondary | ICD-10-CM | POA: Diagnosis not present

## 2016-04-06 DIAGNOSIS — Z8673 Personal history of transient ischemic attack (TIA), and cerebral infarction without residual deficits: Secondary | ICD-10-CM | POA: Diagnosis not present

## 2016-04-06 DIAGNOSIS — Z8249 Family history of ischemic heart disease and other diseases of the circulatory system: Secondary | ICD-10-CM | POA: Insufficient documentation

## 2016-04-06 DIAGNOSIS — Z881 Allergy status to other antibiotic agents status: Secondary | ICD-10-CM | POA: Insufficient documentation

## 2016-04-06 DIAGNOSIS — K7469 Other cirrhosis of liver: Secondary | ICD-10-CM | POA: Insufficient documentation

## 2016-04-06 DIAGNOSIS — E032 Hypothyroidism due to medicaments and other exogenous substances: Secondary | ICD-10-CM | POA: Diagnosis not present

## 2016-04-06 DIAGNOSIS — E1065 Type 1 diabetes mellitus with hyperglycemia: Secondary | ICD-10-CM | POA: Insufficient documentation

## 2016-04-06 DIAGNOSIS — R112 Nausea with vomiting, unspecified: Secondary | ICD-10-CM | POA: Diagnosis present

## 2016-04-06 DIAGNOSIS — R4701 Aphasia: Secondary | ICD-10-CM | POA: Insufficient documentation

## 2016-04-06 DIAGNOSIS — G894 Chronic pain syndrome: Secondary | ICD-10-CM | POA: Diagnosis not present

## 2016-04-06 DIAGNOSIS — Z79899 Other long term (current) drug therapy: Secondary | ICD-10-CM | POA: Insufficient documentation

## 2016-04-06 DIAGNOSIS — R1111 Vomiting without nausea: Secondary | ICD-10-CM | POA: Diagnosis not present

## 2016-04-06 DIAGNOSIS — I420 Dilated cardiomyopathy: Secondary | ICD-10-CM | POA: Insufficient documentation

## 2016-04-06 DIAGNOSIS — I495 Sick sinus syndrome: Secondary | ICD-10-CM | POA: Diagnosis not present

## 2016-04-06 DIAGNOSIS — J449 Chronic obstructive pulmonary disease, unspecified: Secondary | ICD-10-CM | POA: Insufficient documentation

## 2016-04-06 DIAGNOSIS — I480 Paroxysmal atrial fibrillation: Secondary | ICD-10-CM | POA: Diagnosis not present

## 2016-04-06 DIAGNOSIS — E440 Moderate protein-calorie malnutrition: Secondary | ICD-10-CM | POA: Insufficient documentation

## 2016-04-06 DIAGNOSIS — K449 Diaphragmatic hernia without obstruction or gangrene: Secondary | ICD-10-CM | POA: Insufficient documentation

## 2016-04-06 DIAGNOSIS — E876 Hypokalemia: Secondary | ICD-10-CM | POA: Insufficient documentation

## 2016-04-06 DIAGNOSIS — Z8542 Personal history of malignant neoplasm of other parts of uterus: Secondary | ICD-10-CM | POA: Insufficient documentation

## 2016-04-06 DIAGNOSIS — I951 Orthostatic hypotension: Secondary | ICD-10-CM | POA: Insufficient documentation

## 2016-04-06 DIAGNOSIS — Z8711 Personal history of peptic ulcer disease: Secondary | ICD-10-CM | POA: Diagnosis not present

## 2016-04-06 DIAGNOSIS — Z794 Long term (current) use of insulin: Secondary | ICD-10-CM | POA: Insufficient documentation

## 2016-04-06 DIAGNOSIS — M5126 Other intervertebral disc displacement, lumbar region: Secondary | ICD-10-CM | POA: Insufficient documentation

## 2016-04-06 DIAGNOSIS — K529 Noninfective gastroenteritis and colitis, unspecified: Secondary | ICD-10-CM | POA: Insufficient documentation

## 2016-04-06 DIAGNOSIS — Z885 Allergy status to narcotic agent status: Secondary | ICD-10-CM | POA: Insufficient documentation

## 2016-04-06 DIAGNOSIS — F1123 Opioid dependence with withdrawal: Secondary | ICD-10-CM | POA: Diagnosis not present

## 2016-04-06 DIAGNOSIS — E785 Hyperlipidemia, unspecified: Secondary | ICD-10-CM | POA: Insufficient documentation

## 2016-04-06 DIAGNOSIS — D649 Anemia, unspecified: Secondary | ICD-10-CM | POA: Diagnosis not present

## 2016-04-06 DIAGNOSIS — Z888 Allergy status to other drugs, medicaments and biological substances status: Secondary | ICD-10-CM | POA: Insufficient documentation

## 2016-04-06 DIAGNOSIS — T462X5A Adverse effect of other antidysrhythmic drugs, initial encounter: Secondary | ICD-10-CM | POA: Insufficient documentation

## 2016-04-06 DIAGNOSIS — I252 Old myocardial infarction: Secondary | ICD-10-CM | POA: Insufficient documentation

## 2016-04-06 DIAGNOSIS — Z9049 Acquired absence of other specified parts of digestive tract: Secondary | ICD-10-CM | POA: Insufficient documentation

## 2016-04-06 DIAGNOSIS — K219 Gastro-esophageal reflux disease without esophagitis: Secondary | ICD-10-CM | POA: Insufficient documentation

## 2016-04-06 DIAGNOSIS — I10 Essential (primary) hypertension: Secondary | ICD-10-CM | POA: Diagnosis not present

## 2016-04-06 DIAGNOSIS — K567 Ileus, unspecified: Secondary | ICD-10-CM | POA: Insufficient documentation

## 2016-04-06 DIAGNOSIS — Z886 Allergy status to analgesic agent status: Secondary | ICD-10-CM | POA: Insufficient documentation

## 2016-04-06 DIAGNOSIS — R188 Other ascites: Secondary | ICD-10-CM | POA: Insufficient documentation

## 2016-04-06 LAB — COMPREHENSIVE METABOLIC PANEL
ALT: 26 U/L (ref 14–54)
AST: 26 U/L (ref 15–41)
Albumin: 3.7 g/dL (ref 3.5–5.0)
Alkaline Phosphatase: 108 U/L (ref 38–126)
Anion gap: 10 (ref 5–15)
BUN: 19 mg/dL (ref 6–20)
CALCIUM: 9 mg/dL (ref 8.9–10.3)
CHLORIDE: 109 mmol/L (ref 101–111)
CO2: 21 mmol/L — AB (ref 22–32)
CREATININE: 0.63 mg/dL (ref 0.44–1.00)
GFR calc Af Amer: >60 mL/min (ref >60)
GFR calc non Af Amer: >60 mL/min (ref >60)
GLUCOSE: 269 mg/dL — AB (ref 65–99)
Potassium: 3.2 mmol/L — ABNORMAL LOW (ref 3.5–5.1)
SODIUM: 140 mmol/L (ref 135–145)
TOTAL PROTEIN: 7.1 g/dL (ref 6.5–8.1)
Total Bilirubin: 1.1 mg/dL (ref 0.3–1.2)

## 2016-04-06 LAB — CBC
HEMATOCRIT: 31.1 % — AB (ref 35.0–47.0)
Hemoglobin: 10.4 g/dL — ABNORMAL LOW (ref 12.0–16.0)
MCH: 25.5 pg — AB (ref 26.0–34.0)
MCHC: 33.5 g/dL (ref 32.0–36.0)
MCV: 76 fL — AB (ref 80.0–100.0)
PLATELETS: 122 10*3/uL — AB (ref 150–440)
RBC: 4.08 MIL/uL (ref 3.80–5.20)
RDW: 18.5 % — AB (ref 11.5–14.5)
WBC: 5.6 10*3/uL (ref 3.6–11.0)

## 2016-04-06 LAB — LIPASE, BLOOD: LIPASE: 20 U/L (ref 11–51)

## 2016-04-06 MED ORDER — HYDROMORPHONE HCL 1 MG/ML IJ SOLN
1.0000 mg | Freq: Once | INTRAMUSCULAR | Status: AC
Start: 2016-04-06 — End: 2016-04-06
  Administered 2016-04-06: 1 mg via INTRAVENOUS

## 2016-04-06 MED ORDER — METOCLOPRAMIDE HCL 5 MG/ML IJ SOLN
10.0000 mg | Freq: Once | INTRAMUSCULAR | Status: AC
  Administered 2016-04-06: 10 mg via INTRAVENOUS

## 2016-04-06 MED ORDER — METOCLOPRAMIDE HCL 5 MG/ML IJ SOLN
INTRAMUSCULAR | Status: AC
  Administered 2016-04-06: 10 mg via INTRAVENOUS
  Filled 2016-04-06: qty 2

## 2016-04-06 MED ORDER — ONDANSETRON HCL 4 MG/2ML IJ SOLN
4.0000 mg | Freq: Once | INTRAMUSCULAR | Status: AC | PRN
  Administered 2016-04-06: 4 mg via INTRAVENOUS
  Filled 2016-04-06: qty 2

## 2016-04-06 MED ORDER — HYDROMORPHONE HCL 1 MG/ML IJ SOLN
INTRAMUSCULAR | Status: AC
  Administered 2016-04-06: 1 mg via INTRAVENOUS
  Filled 2016-04-06: qty 1

## 2016-04-06 MED ORDER — SODIUM CHLORIDE 0.9 % IV BOLUS (SEPSIS)
1000.0000 mL | Freq: Once | INTRAVENOUS | Status: AC
  Administered 2016-04-06: 1000 mL via INTRAVENOUS

## 2016-04-06 NOTE — ED Triage Notes (Signed)
Pt arrived via EMS from home c/o N/V/D for the last 3 days. Pt has hx of gastroparesis. Pt denies any blood in her vomit or stool.

## 2016-04-07 DIAGNOSIS — I480 Paroxysmal atrial fibrillation: Secondary | ICD-10-CM | POA: Diagnosis not present

## 2016-04-07 DIAGNOSIS — E1043 Type 1 diabetes mellitus with diabetic autonomic (poly)neuropathy: Secondary | ICD-10-CM | POA: Diagnosis not present

## 2016-04-07 DIAGNOSIS — R000 Tachycardia, unspecified: Secondary | ICD-10-CM | POA: Diagnosis not present

## 2016-04-07 DIAGNOSIS — R112 Nausea with vomiting, unspecified: Secondary | ICD-10-CM | POA: Diagnosis not present

## 2016-04-07 DIAGNOSIS — I10 Essential (primary) hypertension: Secondary | ICD-10-CM | POA: Diagnosis not present

## 2016-04-07 LAB — URINALYSIS COMPLETE WITH MICROSCOPIC (ARMC ONLY)
Bilirubin Urine: NEGATIVE
Glucose, UA: >500 mg/dL — AB
Leukocytes, UA: NEGATIVE
Nitrite: NEGATIVE
Protein, ur: 30 mg/dL — AB
Specific Gravity, Urine: 1.012 (ref 1.005–1.030)
pH: 6 (ref 5.0–8.0)

## 2016-04-07 LAB — GLUCOSE, CAPILLARY
Glucose-Capillary: 342 mg/dL — ABNORMAL HIGH (ref 65–99)
Glucose-Capillary: 314 mg/dL — ABNORMAL HIGH (ref 65–99)
Glucose-Capillary: 255 mg/dL — ABNORMAL HIGH (ref 65–99)
Glucose-Capillary: 172 mg/dL — ABNORMAL HIGH (ref 65–99)

## 2016-04-07 MED ORDER — DIPHENHYDRAMINE HCL 25 MG PO CAPS: 50.0000 mg | ORAL_CAPSULE | Freq: Four times a day (QID) | ORAL | Status: DC | PRN

## 2016-04-07 MED ORDER — INSULIN DETEMIR 100 UNIT/ML ~~LOC~~ SOLN
5.0000 [IU] | Freq: Every day | SUBCUTANEOUS | Status: DC
  Administered 2016-04-07 – 2016-04-08 (×2): 5 [IU] via SUBCUTANEOUS
  Filled 2016-04-07 (×3): qty 0.05

## 2016-04-07 MED ORDER — MOMETASONE FURO-FORMOTEROL FUM 200-5 MCG/ACT IN AERO
2.0000 | INHALATION_SPRAY | Freq: Two times a day (BID) | RESPIRATORY_TRACT | Status: DC
  Administered 2016-04-07 – 2016-04-09 (×5): 2 via RESPIRATORY_TRACT
  Filled 2016-04-07: qty 8.8

## 2016-04-07 MED ORDER — PROMETHAZINE HCL 25 MG/ML IJ SOLN
12.5000 mg | Freq: Once | INTRAMUSCULAR | Status: AC
  Administered 2016-04-07: 12.5 mg via INTRAVENOUS

## 2016-04-07 MED ORDER — FUROSEMIDE 20 MG PO TABS
20.0000 mg | ORAL_TABLET | Freq: Every day | ORAL | Status: DC
  Administered 2016-04-07 – 2016-04-09 (×3): 20 mg via ORAL
  Filled 2016-04-07 (×3): qty 1

## 2016-04-07 MED ORDER — HYDROMORPHONE HCL 1 MG/ML IJ SOLN
1.0000 mg | Freq: Once | INTRAMUSCULAR | Status: AC
  Administered 2016-04-07: 1 mg via INTRAVENOUS
  Filled 2016-04-07: qty 1

## 2016-04-07 MED ORDER — ATORVASTATIN CALCIUM 20 MG PO TABS
40.0000 mg | ORAL_TABLET | Freq: Every day | ORAL | Status: DC
  Administered 2016-04-07 – 2016-04-08 (×2): 40 mg via ORAL
  Filled 2016-04-07 (×2): qty 2

## 2016-04-07 MED ORDER — BOOST / RESOURCE BREEZE PO LIQD
1.0000 | Freq: Two times a day (BID) | ORAL | Status: DC
  Administered 2016-04-07 – 2016-04-08 (×3): 1 via ORAL

## 2016-04-07 MED ORDER — OXYCODONE HCL 5 MG PO TABS
5.0000 mg | ORAL_TABLET | Freq: Four times a day (QID) | ORAL | Status: DC | PRN
  Administered 2016-04-07: 11:00:00 5 mg via ORAL
  Administered 2016-04-07: 17:00:00 10 mg via ORAL
  Administered 2016-04-09: 5 mg via ORAL
  Filled 2016-04-07: qty 2
  Filled 2016-04-07 (×2): qty 1

## 2016-04-07 MED ORDER — BISMUTH SUBSALICYLATE 262 MG/15ML PO SUSP: 30.0000 mL | ORAL | Status: DC | PRN

## 2016-04-07 MED ORDER — INSULIN ASPART 100 UNIT/ML ~~LOC~~ SOLN
0.0000 [IU] | Freq: Three times a day (TID) | SUBCUTANEOUS | Status: DC
  Administered 2016-04-07: 7 [IU] via SUBCUTANEOUS
  Administered 2016-04-07: 17:00:00 5 [IU] via SUBCUTANEOUS
  Administered 2016-04-07: 7 [IU] via SUBCUTANEOUS
  Administered 2016-04-08: 17:00:00 1 [IU] via SUBCUTANEOUS
  Administered 2016-04-08: 3 [IU] via SUBCUTANEOUS
  Administered 2016-04-08 – 2016-04-09 (×2): 2 [IU] via SUBCUTANEOUS
  Filled 2016-04-07: qty 5
  Filled 2016-04-07: qty 7
  Filled 2016-04-07: qty 1
  Filled 2016-04-07: qty 2
  Filled 2016-04-07: qty 3
  Filled 2016-04-07: qty 7
  Filled 2016-04-07: qty 2

## 2016-04-07 MED ORDER — ALBUTEROL SULFATE (2.5 MG/3ML) 0.083% IN NEBU: 3.0000 mL | INHALATION_SOLUTION | Freq: Four times a day (QID) | RESPIRATORY_TRACT | Status: DC | PRN

## 2016-04-07 MED ORDER — DICYCLOMINE HCL 10 MG PO CAPS
10.0000 mg | ORAL_CAPSULE | Freq: Three times a day (TID) | ORAL | Status: DC
  Administered 2016-04-07 – 2016-04-09 (×8): 10 mg via ORAL
  Filled 2016-04-07 (×8): qty 1

## 2016-04-07 MED ORDER — SODIUM CHLORIDE 0.9% FLUSH: 3.0000 mL | Freq: Two times a day (BID) | INTRAVENOUS | Status: DC

## 2016-04-07 MED ORDER — APIXABAN 5 MG PO TABS
5.0000 mg | ORAL_TABLET | Freq: Two times a day (BID) | ORAL | Status: DC
  Administered 2016-04-07 – 2016-04-09 (×5): 5 mg via ORAL
  Filled 2016-04-07 (×5): qty 1

## 2016-04-07 MED ORDER — RIFAXIMIN 550 MG PO TABS
550.0000 mg | ORAL_TABLET | Freq: Two times a day (BID) | ORAL | Status: DC
  Administered 2016-04-07 – 2016-04-09 (×5): 550 mg via ORAL
  Filled 2016-04-07 (×6): qty 1

## 2016-04-07 MED ORDER — LEVOTHYROXINE SODIUM 75 MCG PO TABS
75.0000 ug | ORAL_TABLET | Freq: Every day | ORAL | Status: DC
  Administered 2016-04-07 – 2016-04-09 (×3): 75 ug via ORAL
  Filled 2016-04-07 (×3): qty 1

## 2016-04-07 MED ORDER — SODIUM CHLORIDE 0.9 % IV BOLUS (SEPSIS)
1000.0000 mL | Freq: Once | INTRAVENOUS | Status: AC
  Administered 2016-04-07: 1000 mL via INTRAVENOUS

## 2016-04-07 MED ORDER — SUCRALFATE 1 GM/10ML PO SUSP
1.0000 g | Freq: Three times a day (TID) | ORAL | Status: DC
  Administered 2016-04-07 – 2016-04-09 (×8): 1 g via ORAL
  Filled 2016-04-07 (×8): qty 10

## 2016-04-07 MED ORDER — ONDANSETRON HCL 4 MG/2ML IJ SOLN
4.0000 mg | Freq: Once | INTRAMUSCULAR | Status: AC
  Administered 2016-04-07: 4 mg via INTRAVENOUS
  Filled 2016-04-07: qty 2

## 2016-04-07 MED ORDER — OXYCODONE-ACETAMINOPHEN 5-325 MG PO TABS
ORAL_TABLET | ORAL | Status: AC
  Administered 2016-04-07: 2 via ORAL
  Filled 2016-04-07: qty 2

## 2016-04-07 MED ORDER — ONDANSETRON HCL 4 MG PO TABS: 4.0000 mg | ORAL_TABLET | Freq: Four times a day (QID) | ORAL | Status: DC | PRN

## 2016-04-07 MED ORDER — HYDRALAZINE HCL 20 MG/ML IJ SOLN
10.0000 mg | INTRAMUSCULAR | Status: DC | PRN
Start: 2016-04-07 — End: 2016-04-09
  Administered 2016-04-07: 10:00:00 10 mg via INTRAVENOUS
  Filled 2016-04-07: qty 1

## 2016-04-07 MED ORDER — RISAQUAD PO CAPS
2.0000 | ORAL_CAPSULE | Freq: Every day | ORAL | Status: DC
Start: 2016-04-07 — End: 2016-04-09
  Administered 2016-04-08 – 2016-04-09 (×2): 2 via ORAL
  Filled 2016-04-07 (×2): qty 2

## 2016-04-07 MED ORDER — METOCLOPRAMIDE HCL 10 MG PO TABS
10.0000 mg | ORAL_TABLET | Freq: Three times a day (TID) | ORAL | Status: DC
  Administered 2016-04-07 – 2016-04-09 (×9): 10 mg via ORAL
  Filled 2016-04-07 (×9): qty 1

## 2016-04-07 MED ORDER — SPIRONOLACTONE 25 MG PO TABS
25.0000 mg | ORAL_TABLET | Freq: Every day | ORAL | Status: DC
  Administered 2016-04-07 – 2016-04-09 (×3): 25 mg via ORAL
  Filled 2016-04-07 (×3): qty 1

## 2016-04-07 MED ORDER — MORPHINE SULFATE (PF) 2 MG/ML IV SOLN
2.0000 mg | INTRAVENOUS | Status: DC | PRN
  Administered 2016-04-07 – 2016-04-09 (×5): 2 mg via INTRAVENOUS
  Filled 2016-04-07 (×5): qty 1

## 2016-04-07 MED ORDER — PANTOPRAZOLE SODIUM 40 MG PO TBEC
40.0000 mg | DELAYED_RELEASE_TABLET | Freq: Two times a day (BID) | ORAL | Status: DC
  Administered 2016-04-07 – 2016-04-09 (×5): 40 mg via ORAL
  Filled 2016-04-07 (×5): qty 1

## 2016-04-07 MED ORDER — METOPROLOL TARTRATE 25 MG PO TABS
25.0000 mg | ORAL_TABLET | Freq: Two times a day (BID) | ORAL | Status: DC
  Administered 2016-04-07 – 2016-04-09 (×4): 25 mg via ORAL
  Filled 2016-04-07 (×4): qty 1

## 2016-04-07 MED ORDER — LABETALOL HCL 5 MG/ML IV SOLN
20.0000 mg | Freq: Once | INTRAVENOUS | Status: AC
  Administered 2016-04-07: 20 mg via INTRAVENOUS
  Filled 2016-04-07: qty 4

## 2016-04-07 MED ORDER — POTASSIUM CHLORIDE IN NACL 40-0.9 MEQ/L-% IV SOLN
INTRAVENOUS | Status: DC
  Administered 2016-04-07 – 2016-04-08 (×3): 125 mL/h via INTRAVENOUS
  Administered 2016-04-08 – 2016-04-09 (×2): 75 mL/h via INTRAVENOUS
  Filled 2016-04-07 (×8): qty 1000

## 2016-04-07 MED ORDER — PROMETHAZINE HCL 25 MG/ML IJ SOLN
INTRAMUSCULAR | Status: AC
  Administered 2016-04-07: 12.5 mg via INTRAVENOUS
  Filled 2016-04-07: qty 1

## 2016-04-07 MED ORDER — TIZANIDINE HCL 4 MG PO TABS
4.0000 mg | ORAL_TABLET | Freq: Three times a day (TID) | ORAL | Status: DC | PRN
  Administered 2016-04-08: 4 mg via ORAL
  Filled 2016-04-07: qty 1

## 2016-04-07 MED ORDER — MORPHINE SULFATE ER 15 MG PO TBCR
15.0000 mg | EXTENDED_RELEASE_TABLET | Freq: Three times a day (TID) | ORAL | Status: DC
  Administered 2016-04-07 – 2016-04-09 (×7): 15 mg via ORAL
  Filled 2016-04-07 (×7): qty 1

## 2016-04-07 MED ORDER — GI COCKTAIL ~~LOC~~
30.0000 mL | Freq: Three times a day (TID) | ORAL | Status: DC | PRN
  Filled 2016-04-07: qty 30

## 2016-04-07 MED ORDER — ONDANSETRON HCL 4 MG/2ML IJ SOLN: 4.0000 mg | Freq: Four times a day (QID) | INTRAMUSCULAR | Status: DC | PRN

## 2016-04-07 MED ORDER — OXYCODONE-ACETAMINOPHEN 5-325 MG PO TABS
2.0000 | ORAL_TABLET | Freq: Once | ORAL | Status: AC
  Administered 2016-04-07: 2 via ORAL

## 2016-04-07 NOTE — Progress Notes (Addendum)
Initial Nutrition Assessment  DOCUMENTATION CODES:    Non-severe (moderate) malnutrition in context of chronic illness  INTERVENTION:  1.  Meals/snacks; diet advancement as medically appropriate based on patient ability to tolerate.  2.  Supplements;   Patient has used Glucerna during hospitalizations in the past and would like to continue at present once able. Current diet order is for clear liquids only.  Will order Boost Breeze BID until diet advanced, then recommend Glucerna shakes BID prn.   NUTRITION DIAGNOSIS:   Inadequate oral intake related to altered GI function as evidenced by per patient/family report.  GOAL:   Patient will meet greater than or equal to 90% of their needs  MONITOR:   PO intake, Supplement acceptance  REASON FOR ASSESSMENT:   Malnutrition Screening Tool    ASSESSMENT:   RD drawn to chart via MST screening  The patient is well known to the hospitalist service with past medical history of gastroparesis and nonalcoholic cirrhosis of liver presents to the emergency department complaining of multiple episodes of nonbloody nonbilious emesis. She received multiple doses of antiemetics and pain medication in the emergency department without much relief. She was noted to be tachycardic and continued to have mild abdominal pain which prompted the emergency department staff to call for admission.  Discussed nutrition status with patient who reports she has "been through this before."  States she when she has these "episodes" they last about 1 week during which time she has intractable nausea and vomiting.  Typically with bowel rest, nausea and vomiting resolve and status improves.  Patient was diagnosed with moderate protein-calorie malnutrition 2 months ago based on weight loss and poor intake.  Currently her weight is consistent with her usual.  She has not been able to tolerate foods or liquids for 5 days.  Her malnutrition is likely ongoing.   Nutrition  focused physical exam shows mild wasting at temples, clavicle, and dorsal hands.   Diet Order:  Diet clear liquid Room service appropriate? Yes; Fluid consistency: Thin  Skin:  Reviewed, no issues  Last BM:  7/29  Height:   Ht Readings from Last 1 Encounters:  04/06/16 5' 3"  (1.6 m)    Weight:   Wt Readings from Last 1 Encounters:  04/07/16 138 lb (62.6 kg)    Ideal Body Weight:     BMI:  Body mass index is 24.45 kg/m.  Estimated Nutritional Needs:   Kcal:  1308-6578  Protein:  76-88g  Fluid:  1.8 L/day  EDUCATION NEEDS:   Education needs addressed  Brynda Greathouse, MS RD LDN Clinical Inpatient Dietitian Weekend/After hours pager: [phone]

## 2016-04-07 NOTE — Progress Notes (Signed)
[place] [facility] [place] NAME: [name]    MRN#:  [id number]  DATE OF BIRTH:  1956-06-06  SUBJECTIVE:  Hospital Day: 0 days [name] is a 60 y.o. female presenting with Nausea; Emesis; and Diarrhea .   Overnight events: Admitted overnight Interval Events: Still complains of abdominal pain described as sharp, epigastric in location, nonradiating  REVIEW OF SYSTEMS:  CONSTITUTIONAL: No fever, fatigue or weakness.  EYES: No blurred or double vision.  EARS, NOSE, AND THROAT: No tinnitus or ear pain.  RESPIRATORY: No cough, shortness of breath, wheezing or hemoptysis.  CARDIOVASCULAR: No chest pain, orthopnea, edema.  GASTROINTESTINAL: Positive nausea, vomiting, diarrhea and abdominal pain.  GENITOURINARY: No dysuria, hematuria.  ENDOCRINE: No polyuria, nocturia,  HEMATOLOGY: No anemia, easy bruising or bleeding SKIN: No rash or lesion. MUSCULOSKELETAL: No joint pain or arthritis.   NEUROLOGIC: No tingling, numbness, weakness.  PSYCHIATRY: No anxiety or depression.   DRUG ALLERGIES:   Allergies  Allergen Reactions  . Hydrocodone Other (See Comments)    Pt states that this medication caused cirrhosis of the liver.    . Aspirin   . Erythromycin Other (See Comments)    Reaction:  Fever   . Prednisone Other (See Comments)    Reaction:  Unknown   . Rosiglitazone Maleate Swelling  . Codeine Sulfate Rash  . Tetanus-Diphtheria Toxoids Td Rash and Other (See Comments)    Reaction:  Fever     VITALS:  Blood pressure (!) 168/82, pulse (!) 109, temperature 97.8 F (36.6 C), temperature source Oral, resp. rate (!) 22, height 5' 3"  (1.6 m), weight 138 lb (62.6 kg), SpO2 100 %.  PHYSICAL EXAMINATION:  VITAL SIGNS: Vitals:   04/07/16 1054 04/07/16 1122  BP: (!) 181/82 (!) 168/82  Pulse: (!) 109   Resp: (!) 22   Temp:     GENERAL:60 y.o.female currently in no acute distress.  HEAD: Normocephalic, atraumatic.  EYES: Pupils  equal, round, reactive to light. Extraocular muscles intact. No scleral icterus.  MOUTH: Moist mucosal membrane. Dentition intact. No abscess noted.  EAR, NOSE, THROAT: Clear without exudates. No external lesions.  NECK: Supple. No thyromegaly. No nodules. No JVD.  PULMONARY: Clear to ascultation, without wheeze rails or rhonci. No use of accessory muscles, Good respiratory effort. good air entry bilaterally CHEST: Nontender to palpation.  CARDIOVASCULAR: S1 and S2. Regular rate and rhythm. No murmurs, rubs, or gallops. No edema. Pedal pulses 2+ bilaterally.  GASTROINTESTINAL: Soft, nontender, nondistended. No masses. Positive bowel sounds. No hepatosplenomegaly.  MUSCULOSKELETAL: No swelling, clubbing, or edema. Range of motion full in all extremities.  NEUROLOGIC: Cranial nerves II through XII are intact. No gross focal neurological deficits. Sensation intact. Reflexes intact.  SKIN: No ulceration, lesions, rashes, or cyanosis. Skin warm and dry. Turgor intact.  PSYCHIATRIC: Mood, affect within normal limits. The patient is awake, alert and oriented x 3. Insight, judgment intact.      LABORATORY PANEL:   CBC  Recent Labs Lab 04/06/16 2321  WBC 5.6  HGB 10.4*  HCT 31.1*  PLT 122*   ------------------------------------------------------------------------------------------------------------------  Chemistries   Recent Labs Lab 04/06/16 2321  NA 140  K 3.2*  CL 109  CO2 21*  GLUCOSE 269*  BUN 19  CREATININE 0.63  CALCIUM 9.0  AST 26  ALT 26  ALKPHOS 108  BILITOT 1.1   ------------------------------------------------------------------------------------------------------------------  Cardiac Enzymes No results for input(s): TROPONINI in the last 168 hours. ------------------------------------------------------------------------------------------------------------------  RADIOLOGY:  No results  found.  EKG:   Orders placed or performed during the hospital  encounter of 04/06/16  . EKG 12-Lead  . EKG 12-Lead    ASSESSMENT AND PLAN:   [name] is a 60 y.o. female presenting with Nausea; Emesis; and Diarrhea . Admitted 04/06/2016 : Day #: 0 days   1. Intractable nausea and vomiting: Secondary to gastroparesis. Continue Reglan, Bentyl and PPI. Had GI cocktail, increased pain medications slightly, supportive measures. 3. Essential hypertension: Uncontrolled, Continue metoprolol. Add IV hydralazine if continues to need IV medications will add additional oral agents 4. Atrial fibrillation: Paroxysmal; continue Eliquis 5. Prolonged QT interval: This is a chronic issue. The patient is on multiple QT prolonging agents as an outpatient. 6. Diabetes mellitus insulin requiring continue the patient's basal insulin and place on sliding scale. 7. COPD: Continue inhaled corticosteroid as well as albuterol as needed. 8. Hypothyroidism: Continue Synthroid 9. Hyperlipidemia: Continue statin therapy 10. Cirrhosis of liver: Continue rifaximin as well as Lasix and spironolactone per home regimen 11. DVT prophylaxis: Full anticoagulation as above 12. GI prophylaxis: PPI per home regimen The patient is a DO NOT RESUSCITATE   All the records are reviewed and case discussed with Care Management/Social Workerr. Management plans discussed with the patient, family and they are in agreement.  CODE STATUS: full TOTAL TIME TAKING CARE OF THIS PATIENT: 33 minutes.   POSSIBLE D/C IN 1-2DAYS, DEPENDING ON CLINICAL CONDITION.   Hower,  Karenann Cai.D on 04/07/2016 at 11:42 AM  Between 7am to 6pm - Pager - [phone]  After 6pm: House Pager: - [phone]  [facility]  Office  [phone]  CC: Primary care physician; No PCP Per Patient

## 2016-04-07 NOTE — ED Notes (Signed)
Pt with continued hypertension and tachycardia, md notified, order for labetalol received.

## 2016-04-07 NOTE — ED Provider Notes (Signed)
[facility] Emergency Department Provider Note  ____________________________________________  Time seen:   I have reviewed the triage vital signs and the nursing notes.   HISTORY  Chief Complaint Nausea; Emesis; and Diarrhea      HPI [name] is a 60 y.o. female with history of chronic abdominal pain and gastroparesis presents with "usual pain and vomiting". Patient admits to 10 out of 10 generalized abdominal pain and accompanying vomiting for the past 2 days. Patient denies any fever. Patient denies any blood in her emesis.     Past Medical History:  Diagnosis Date  . Allergy   . Anxiety   . Cancer (HCC)    HX OF CANCER OF UTERUS   . Cirrhosis of liver not due to alcohol ([place]) 2016  . Degenerative disk disease   . Diverticulitis   . Gastroparesis   . GERD (gastroesophageal reflux disease)   . History of hiatal hernia   . Hypertension   . Hypothyroid   . Intussusception intestine ([place]) 05/2015  . PAF (paroxysmal atrial fibrillation) ([place]) 03/2015   a. new onset 03/2015 in setting of intractable N/V; b. on Eliquis 5 mg bid; c. CHADSVASc 4 (DM, TIA x 2, female)  . Pancreatitis   . Sick sinus syndrome ([place])   . Stomach ulcer   . Stroke [facility])    with minimal left sided weakness  . Syncope 01/2015  . TIA (transient ischemic attack) 02/2015  . Type 1 diabetes ([place])    on levemir    Patient Active Problem List   Diagnosis Date Noted  . Epigastric pain   . Abdominal pain, epigastric   . Personal history of surgery to heart and great vessels, presenting hazards to health   . Gastritis   . Foreign body in stomach   . Abnormal findings-gastrointestinal tract   . Diarrhea   . Congestive dilated cardiomyopathy ([place])   . Generalized abdominal pain   . Nausea & vomiting   . NSTEMI (non-ST elevated myocardial infarction) ([place]) 01/11/2016  . Tachyarrhythmia 01/10/2016  . Intractable nausea and vomiting 01/10/2016  . Tachy-brady syndrome  ([name]) 12/28/2015  . Abdominal pain 12/27/2015  . Colitis 12/16/2015  . Pneumonia 11/14/2015  . Narcotic withdrawal ([place]) 11/11/2015  . Diabetes mellitus type 2, insulin dependent ([place]) 11/11/2015  . Orthostatic hypotension   . H/O TIA (transient ischemic attack) and stroke   . Left-sided weakness 10/04/2015  . Expressive aphasia 10/04/2015  . Ileus ([place]) 08/01/2015  . Ascites   . Cryptogenic cirrhosis ([place]) 07/10/2015  . Paroxysmal atrial fibrillation ([place]) 07/10/2015  . C. difficile colitis 07/10/2015  . GI (gastrointestinal bleed) 07/06/2015  . Malnutrition of moderate degree 07/04/2015  . Symptomatic bradycardia 05/27/2015  . Intussusception intestine ([place])   . Hypothyroidism due to amiodarone   . Abdominal pain, chronic, epigastric   . Chronic anemia 05/19/2015  . Thrombocytopenia ([place]) 05/19/2015  . Prolonged QT interval   . Hypomagnesemia   . Narcotic abuse   . Uncontrolled type 2 diabetes mellitus with gastroparesis ([place])   . Syncope due to orthostatic hypotension 05/18/2015  . Hypokalemia 04/06/2015  . Hyperlipidemia with target LDL less than 100 04/06/2015  . CVA (cerebral infarction) 02/15/2015  . Essential hypertension 01/12/2015  . Chronically on opiate therapy 01/12/2015  . Gastroparesis 01/12/2015  . DEPRESSION/ANXIETY 06/27/2007  . MYOFASCIAL PAIN SYNDROME 06/27/2007  . Chronic pain syndrome 03/28/2007  . GERD 03/27/2007  . DIVERTICULOSIS, COLON 03/27/2007  . LUMBAR DISC DISPLACEMENT 03/27/2007  . PROTEINURIA  03/27/2007  . UTERINE CANCER, HX OF 03/27/2007    Past Surgical History:  Procedure Laterality Date  . ABDOMINAL HYSTERECTOMY    . CARDIAC CATHETERIZATION N/A 01/12/2016   Procedure: Left Heart Cath and Coronary Angiography;  Surgeon: Wellington Hampshire, MD;  Location: [facility];  Service: Cardiovascular;  Laterality: N/A;  . CHOLECYSTECTOMY    . ESOPHAGOGASTRODUODENOSCOPY N/A 04/04/2015   Procedure: ESOPHAGOGASTRODUODENOSCOPY (EGD);   Surgeon: Hulen Luster, MD;  Location: [facility] ENDOSCOPY;  Service: Endoscopy;  Laterality: N/A;  . ESOPHAGOGASTRODUODENOSCOPY (EGD) WITH PROPOFOL N/A 01/18/2016   Procedure: ESOPHAGOGASTRODUODENOSCOPY (EGD) WITH PROPOFOL;  Surgeon: Lucilla Lame, MD;  Location: [facility] ENDOSCOPY;  Service: Endoscopy;  Laterality: N/A;  . FLEXIBLE SIGMOIDOSCOPY N/A 01/18/2016   Procedure: FLEXIBLE SIGMOIDOSCOPY;  Surgeon: Lucilla Lame, MD;  Location: [facility] ENDOSCOPY;  Service: Endoscopy;  Laterality: N/A;  . HERNIA REPAIR      Current Outpatient Rx  . Order #: [id number] Class: Normal  . Order #: [id number] Class: Normal  . Order #: [id number] Class: Normal  . Order #: [id number] Class: Normal  . Order #: [id number] Class: Historical Med  . Order #: [id number] Class: Normal  . Order #: [id number] Class: Normal  . Order #: [id number] Class: Historical Med  . Order #: [id number] Class: Print  . Order #: [id number] Class: Normal  . Order #: [id number] Class: Historical Med  . Order #: [id number] Class: Normal  . Order #: [id number] Class: Normal  . Order #: [id number] Class: Print  . Order #: [id number] Class: Historical Med  . Order #: [id number] Class: Print  . Order #: [id number] Class: Print  . Order #: [id number] Class: Historical Med  . Order #: [id number] Class: Historical Med  . Order #: [id number] Class: Normal  . Order #: [id number] Class: Normal  . Order #: [id number] Class: Normal  . Order #: [id number] Class: Historical Med    Allergies Hydrocodone; Aspirin; Erythromycin; Prednisone; Rosiglitazone maleate; Codeine sulfate; and Tetanus-diphtheria toxoids td  Family History  Problem Relation Age of Onset  . Hypertension Mother   . CAD Sister   . Heart attack Sister     Deceased Nov 14, 2014  . CAD Brother     Social History Social History  Substance Use Topics  . Smoking status: Never Smoker  . Smokeless tobacco: Never Used  . Alcohol use No    Review of Systems  Constitutional: Negative for fever. Eyes: Negative for visual  changes. ENT: Negative for sore throat. Cardiovascular: Negative for chest pain. Respiratory: Negative for shortness of breath. Gastrointestinal: Positive for abdominal pain, vomiting and diarrhea. Genitourinary: Negative for dysuria. Musculoskeletal: Negative for back pain. Skin: Negative for rash. Neurological: Negative for headaches, focal weakness or numbness.   10-point ROS otherwise negative.  ____________________________________________   PHYSICAL EXAM:  VITAL SIGNS: ED Triage Vitals  Enc Vitals Group     BP 04/06/16 2315 (!) 193/86     Pulse Rate 04/06/16 2315 (!) 112     Resp 04/06/16 2315 20     Temp 04/06/16 2315 98.4 F (36.9 C)     Temp Source 04/06/16 2315 Oral     SpO2 04/06/16 2315 100 %     Weight 04/06/16 2315 130 lb (59 kg)     Height 04/06/16 2315 5' 3"  (1.6 m)     Head Circumference --      Peak Flow --      Pain Score 04/06/16 2316 10     Pain Loc --      Pain Edu? --      Excl. in [place]? --      Constitutional: Alert and  oriented. Well appearing and in no distress. Eyes: Conjunctivae are normal. PERRL. Normal extraocular movements. ENT   Head: Normocephalic and atraumatic.   Nose: No congestion/rhinnorhea.   Mouth/Throat: Mucous membranes are moist.   Neck: No stridor. Hematological/Lymphatic/Immunilogical: No cervical lymphadenopathy. Cardiovascular: Normal rate, regular rhythm. Normal and symmetric distal pulses are present in all extremities. No murmurs, rubs, or gallops. Respiratory: Normal respiratory effort without tachypnea nor retractions. Breath sounds are clear and equal bilaterally. No wheezes/rales/rhonchi. Gastrointestinal: General  tenderness palpation No distention. There is no CVA tenderness. Genitourinary: deferred Musculoskeletal: Nontender with normal range of motion in all extremities. No joint effusions.  No lower extremity tenderness nor edema. Neurologic:  Normal speech and language. No gross focal neurologic  deficits are appreciated. Speech is normal.  Skin:  Skin is warm, dry and intact. No rash noted. Psychiatric: Mood and affect are normal. Speech and behavior are normal. Patient exhibits appropriate insight and judgment.  ____________________________________________    LABS (pertinent positives/negatives)  Labs Reviewed  COMPREHENSIVE METABOLIC PANEL - Abnormal; Notable for the following:       Result Value   Potassium 3.2 (*)    CO2 21 (*)    Glucose, Bld 269 (*)    All other components within normal limits  CBC - Abnormal; Notable for the following:    Hemoglobin 10.4 (*)    HCT 31.1 (*)    MCV 76.0 (*)    MCH 25.5 (*)    RDW 18.5 (*)    Platelets 122 (*)    All other components within normal limits  LIPASE, BLOOD  URINALYSIS COMPLETEWITH MICROSCOPIC ([facility] ONLY)     ___   Procedures      INITIAL IMPRESSION / ASSESSMENT AND PLAN / ED COURSE  Pertinent labs & imaging results that were available during my care of the patient were reviewed by me and considered in my medical decision making (see chart for details).  Patient given multiple doses of antiemetic and narcotic medications emergency department without improvement of pain and vomiting. As such patient discussed with Dr. Marcille Blanco for hospital admission further evaluation and management  ____________________________________________   FINAL CLINICAL IMPRESSION(S) / ED DIAGNOSES  Final diagnoses:  Gastroparesis  Intractable vomiting with nausea, vomiting of unspecified type      Gregor Hams, MD 04/07/16 0501

## 2016-04-07 NOTE — H&P (Signed)
[name] is an 60 y.o. female.    Chief Complaint: Nausea and vomiting HPI: The patient is well known to the hospitalist service with past medical history of gastroparesis and nonalcoholic cirrhosis of liver presents to the emergency department complaining of multiple episodes of nonbloody nonbilious emesis. She received multiple doses of antiemetics and pain medication in the emergency department without much relief. She was noted to be tachycardic and continued to have mild abdominal pain which prompted the emergency department staff to call for admission.  Past Medical History:  Diagnosis Date  . Allergy   . Anxiety   . Cancer (HCC)    HX OF CANCER OF UTERUS   . Cirrhosis of liver not due to alcohol ([place]) 24-Oct-2014  . Degenerative disk disease   . Diverticulitis   . Gastroparesis   . GERD (gastroesophageal reflux disease)   . History of hiatal hernia   . Hypertension   . Hypothyroid   . Intussusception intestine ([place]) 05/2015  . PAF (paroxysmal atrial fibrillation) ([place]) 03/2015   a. new onset 03/2015 in setting of intractable N/V; b. on Eliquis 5 mg bid; c. CHADSVASc 4 (DM, TIA x 2, female)  . Pancreatitis   . Sick sinus syndrome ([place])   . Stomach ulcer   . Stroke [facility])    with minimal left sided weakness  . Syncope 01/2015  . TIA (transient ischemic attack) 02/2015  . Type 1 diabetes ([place])    on levemir    Past Surgical History:  Procedure Laterality Date  . ABDOMINAL HYSTERECTOMY    . CARDIAC CATHETERIZATION N/A 01/12/2016   Procedure: Left Heart Cath and Coronary Angiography;  Surgeon: Wellington Hampshire, MD;  Location: [facility];  Service: Cardiovascular;  Laterality: N/A;  . CHOLECYSTECTOMY    . ESOPHAGOGASTRODUODENOSCOPY N/A 04/04/2015   Procedure: ESOPHAGOGASTRODUODENOSCOPY (EGD);  Surgeon: Hulen Luster, MD;  Location: [facility] ENDOSCOPY;  Service: Endoscopy;  Laterality: N/A;  . ESOPHAGOGASTRODUODENOSCOPY (EGD) WITH PROPOFOL N/A 01/18/2016   Procedure:  ESOPHAGOGASTRODUODENOSCOPY (EGD) WITH PROPOFOL;  Surgeon: Lucilla Lame, MD;  Location: [facility] ENDOSCOPY;  Service: Endoscopy;  Laterality: N/A;  . FLEXIBLE SIGMOIDOSCOPY N/A 01/18/2016   Procedure: FLEXIBLE SIGMOIDOSCOPY;  Surgeon: Lucilla Lame, MD;  Location: [facility] ENDOSCOPY;  Service: Endoscopy;  Laterality: N/A;  . HERNIA REPAIR      Family History  Problem Relation Age of Onset  . Hypertension Mother   . CAD Sister   . Heart attack Sister     Deceased 10/24/2014  . CAD Brother    Social History:  reports that she has never smoked. She has never used smokeless tobacco. She reports that she does not drink alcohol or use drugs.  Allergies:  Allergies  Allergen Reactions  . Hydrocodone Other (See Comments)    Pt states that this medication caused cirrhosis of the liver.    . Aspirin   . Erythromycin Other (See Comments)    Reaction:  Fever   . Prednisone Other (See Comments)    Reaction:  Unknown   . Rosiglitazone Maleate Swelling  . Codeine Sulfate Rash  . Tetanus-Diphtheria Toxoids Td Rash and Other (See Comments)    Reaction:  Fever     Medications Prior to Admission  Medication Sig Dispense Refill  . acidophilus (RISAQUAD) CAPS capsule Take 2 capsules by mouth daily. 60 capsule 0  . albuterol (PROVENTIL HFA;VENTOLIN HFA) 108 (90 Base) MCG/ACT inhaler Inhale 2 puffs into the lungs every 6 (six) hours as needed for wheezing or shortness  of breath. 1 Inhaler 2  . amoxicillin-clavulanate (AUGMENTIN) 875-125 MG tablet Take 1 tablet by mouth 2 (two) times daily. 16 tablet 0  . apixaban (ELIQUIS) 5 MG TABS tablet Take 1 tablet (5 mg total) by mouth 2 (two) times daily. 60 tablet 0  . atorvastatin (LIPITOR) 40 MG tablet Take 40 mg by mouth at bedtime.    . bismuth subsalicylate (PEPTO BISMOL) 262 MG/15ML suspension Take 30 mLs by mouth every 4 (four) hours as needed for diarrhea or loose stools. 360 mL 0  . budesonide-formoterol (SYMBICORT) 160-4.5 MCG/ACT inhaler Inhale 2 puffs into the  lungs 2 (two) times daily. 1 Inhaler 12  . dicyclomine (BENTYL) 10 MG capsule Take 10 mg by mouth 4 (four) times daily -  before meals and at bedtime.     . diphenhydrAMINE (BENADRYL) 25 mg capsule Take 2 capsules (50 mg total) by mouth every 6 (six) hours as needed. 60 capsule 0  . furosemide (LASIX) 20 MG tablet Take 1 tablet (20 mg total) by mouth daily. 30 tablet 0  . insulin aspart (NOVOLOG) 100 UNIT/ML injection Inject into the skin 3 (three) times daily with meals as needed for high blood sugar. Pt uses as needed per sliding scale.    . insulin detemir (LEVEMIR) 100 UNIT/ML injection Inject 0.1 mLs (10 Units total) into the skin at bedtime. 10 mL 11  . levothyroxine (SYNTHROID, LEVOTHROID) 75 MCG tablet Take 1 tablet (75 mcg total) by mouth daily before breakfast. 30 tablet 1  . metoCLOPramide (REGLAN) 10 MG tablet Take 1 tablet (10 mg total) by mouth 4 (four) times daily -  before meals and at bedtime. 60 tablet 0  . metoprolol tartrate (LOPRESSOR) 25 MG tablet Take 25 mg by mouth 2 (two) times daily.     Marland Kitchen morphine (MS CONTIN) 15 MG 12 hr tablet Take 1 tablet (15 mg total) by mouth every 8 (eight) hours. 75 tablet 0  . ondansetron (ZOFRAN ODT) 4 MG disintegrating tablet Take 1 tablet (4 mg total) by mouth every 8 (eight) hours as needed for nausea or vomiting. 20 tablet 0  . oxyCODONE (OXY IR/ROXICODONE) 5 MG immediate release tablet Take 5-10 mg by mouth every 6 (six) hours as needed for severe pain.    . pantoprazole (PROTONIX) 40 MG tablet Take 40 mg by mouth 2 (two) times daily before a meal.    . rifaximin (XIFAXAN) 550 MG TABS tablet Take 1 tablet (550 mg total) by mouth 2 (two) times daily. 60 tablet 6  . spironolactone (ALDACTONE) 25 MG tablet Take 1 tablet (25 mg total) by mouth daily. 30 tablet 0  . sucralfate (CARAFATE) 1 GM/10ML suspension Take 10 mLs (1 g total) by mouth 4 (four) times daily -  before meals and at bedtime. 420 mL 0  . tiZANidine (ZANAFLEX) 4 MG tablet Take 4 mg  by mouth 3 (three) times daily as needed for muscle spasms.       Results for orders placed or performed during the hospital encounter of 04/06/16 (from the past 48 hour(s))  Lipase, blood     Status: None   Collection Time: 04/06/16 11:21 PM  Result Value Ref Range   Lipase 20 11 - 51 U/L  Comprehensive metabolic panel     Status: Abnormal   Collection Time: 04/06/16 11:21 PM  Result Value Ref Range   Sodium 140 135 - 145 mmol/L   Potassium 3.2 (L) 3.5 - 5.1 mmol/L   Chloride 109 101 - 111  mmol/L   CO2 21 (L) 22 - 32 mmol/L   Glucose, Bld 269 (H) 65 - 99 mg/dL   BUN 19 6 - 20 mg/dL   Creatinine, Ser 0.63 0.44 - 1.00 mg/dL   Calcium 9.0 8.9 - 10.3 mg/dL   Total Protein 7.1 6.5 - 8.1 g/dL   Albumin 3.7 3.5 - 5.0 g/dL   AST 26 15 - 41 U/L   ALT 26 14 - 54 U/L   Alkaline Phosphatase 108 38 - 126 U/L   Total Bilirubin 1.1 0.3 - 1.2 mg/dL   GFR calc non Af Amer >60 >60 mL/min   GFR calc Af Amer >60 >60 mL/min    Comment: (NOTE) The eGFR has been calculated using the CKD EPI equation. This calculation has not been validated in all clinical situations. eGFR's persistently <60 mL/min signify possible Chronic Kidney Disease.    Anion gap 10 5 - 15  CBC     Status: Abnormal   Collection Time: 04/06/16 11:21 PM  Result Value Ref Range   WBC 5.6 3.6 - 11.0 K/uL   RBC 4.08 3.80 - 5.20 MIL/uL   Hemoglobin 10.4 (L) 12.0 - 16.0 g/dL   HCT 31.1 (L) 35.0 - 47.0 %   MCV 76.0 (L) 80.0 - 100.0 fL   MCH 25.5 (L) 26.0 - 34.0 pg   MCHC 33.5 32.0 - 36.0 g/dL   RDW 18.5 (H) 11.5 - 14.5 %   Platelets 122 (L) 150 - 440 K/uL   No results found.  Review of Systems  Constitutional: Negative for chills and fever.  HENT: Negative for sore throat and tinnitus.   Eyes: Negative for blurred vision and redness.  Respiratory: Negative for cough and shortness of breath.   Cardiovascular: Negative for chest pain, palpitations, orthopnea and PND.  Gastrointestinal: Positive for abdominal pain, nausea  and vomiting. Negative for diarrhea.  Genitourinary: Negative for dysuria, frequency and urgency.  Musculoskeletal: Negative for joint pain and myalgias.  Skin: Negative for rash.       No lesions  Neurological: Negative for speech change, focal weakness and weakness.  Endo/Heme/Allergies: Does not bruise/bleed easily.       No temperature intolerance  Psychiatric/Behavioral: Negative for depression and suicidal ideas.    Blood pressure (!) 184/92, pulse 93, temperature 97.8 F (36.6 C), temperature source Oral, resp. rate (!) 22, height _0  (1.6 m), weight 62.6 kg (138 lb), SpO2 100 %. Physical Exam  Vitals reviewed. Constitutional: She is oriented to person, place, and time. She appears well-developed and well-nourished. No distress.  HENT:  Head: Normocephalic and atraumatic.  Mouth/Throat: Oropharynx is clear and moist.  Eyes: Conjunctivae and EOM are normal. Pupils are equal, round, and reactive to light. No scleral icterus.  Neck: Normal range of motion. Neck supple. No JVD present. No tracheal deviation present. No thyromegaly present.  Cardiovascular: Normal rate, regular rhythm and normal heart sounds.  Exam reveals no gallop and no friction rub.   No murmur heard. Respiratory: Effort normal and breath sounds normal.  GI: Soft. Bowel sounds are normal. She exhibits no distension. There is no tenderness.  Genitourinary:  Genitourinary Comments: Deferred  Musculoskeletal: Normal range of motion. She exhibits no edema.  Lymphadenopathy:    She has no cervical adenopathy.  Neurological: She is alert and oriented to person, place, and time. No cranial nerve deficit.  Skin: Skin is warm and dry. No rash noted. No erythema.  Psychiatric: She has a normal mood and affect. Her behavior  is normal. Judgment and thought content normal.     Assessment/Plan  This is a 60 year old female admitted for intractable nausea and vomiting. 1. Intractable nausea and vomiting: Secondary to  gastroparesis. Continue Reglan, Bentyl and PPI. Hydrate with intravenous fluid. IV antiemetics as necessary. 2. Tachycardia: The patient has known history of tachybradycardia syndrome. She also may be mildly dehydrated secondary to multiple episodes of vomiting. Pain may also be a component of the patient's elevated heart rate given musculoskeletal strain secondary to vomiting. 3. Essential hypertension: Uncontrolled; likely secondary to repeated vomiting. Continue metoprolol.  4. Atrial fibrillation: Paroxysmal; continue Eliquis 5. Prolonged QT interval: This is a chronic issue. The patient is on multiple QT prolonging agents as an outpatient. 6. Diabetes mellitus: Unclear if type I or 2 but we will continue the patient's basal insulin and place on sliding scale. 7. COPD: Continue inhaled corticosteroid as well as albuterol as needed. 8. Hypothyroidism: Continue Synthroid 9. Hyperlipidemia: Continue statin therapy 10. Cirrhosis of liver: Continue rifaximin as well as Lasix and spironolactone per home regimen 11. DVT prophylaxis: Full anticoagulation as above 12. GI prophylaxis: PPI per home regimen The patient is a DO NOT RESUSCITATE. Time spent on admission orders and patient care proximally 45 minutes  Harrie Foreman, MD 04/07/2016, 6:17 AM

## 2016-04-07 NOTE — Care Management Note (Signed)
Case Management Note  Patient Details  Name: ALAURA SCHIPPERS MRN: [id number] Date of Birth: 12-Oct-1955  Subjective/Objective:     60yo Mrs Dailynn Nancarrow was admitted from home per nausea, vomiting, diarrhea x 3 days. Resides with her husband. Quite nauseated during our conversation but not actively vomiting at this time. Still has no PCP after her previous PCP is no longer seeing patients. Mrs Mellor has a plan to contact her insurance company to see who is in network in this area. Strongly encouraged Mrs Meath to follow up with her plan to locate a PCP. Pharmacy=[facility] in [place]. No home oxygen. Has a rolling walker, cane and shower chair. Chronic Eliquis. Hx: A-Fib, COPD, liver cirrhosis. Case management will follow for discharge planning.                Action/Plan:   Expected Discharge Date:                  Expected Discharge Plan:     In-House Referral:     Discharge planning Services     Post Acute Care Choice:    Choice offered to:     DME Arranged:    DME Agency:     HH Arranged:    HH Agency:     Status of Service:     If discussed at [employer] of Stay Meetings, dates discussed:    Additional Comments:  Zebulen Simonis A, RN 04/07/2016, 12:58 PM

## 2016-04-07 NOTE — ED Notes (Signed)
Report to rachel, rn.

## 2016-04-07 NOTE — Care Management Obs Status (Signed)
[facility] NOTIFICATION   Patient Details  Name: IVELIZ GARAY MRN: [id number] Date of Birth: 05-Dec-1955   Medicare Observation Status Notification Given:  Yes    Yoshiaki Kreuser A, RN 04/07/2016, 1:08 PM

## 2016-04-08 DIAGNOSIS — I10 Essential (primary) hypertension: Secondary | ICD-10-CM | POA: Diagnosis not present

## 2016-04-08 DIAGNOSIS — I480 Paroxysmal atrial fibrillation: Secondary | ICD-10-CM | POA: Diagnosis not present

## 2016-04-08 DIAGNOSIS — E1043 Type 1 diabetes mellitus with diabetic autonomic (poly)neuropathy: Secondary | ICD-10-CM | POA: Diagnosis not present

## 2016-04-08 DIAGNOSIS — R112 Nausea with vomiting, unspecified: Secondary | ICD-10-CM | POA: Diagnosis not present

## 2016-04-08 LAB — GLUCOSE, CAPILLARY
Glucose-Capillary: 241 mg/dL — ABNORMAL HIGH (ref 65–99)
Glucose-Capillary: 155 mg/dL — ABNORMAL HIGH (ref 65–99)
Glucose-Capillary: 147 mg/dL — ABNORMAL HIGH (ref 65–99)

## 2016-04-08 MED ORDER — SODIUM CHLORIDE 0.9 % IV BOLUS (SEPSIS)
1000.0000 mL | INTRAVENOUS | Status: DC | PRN
  Administered 2016-04-08: 1000 mL via INTRAVENOUS
  Filled 2016-04-08: qty 1000

## 2016-04-08 MED ORDER — GLUCERNA SHAKE PO LIQD
237.0000 mL | Freq: Three times a day (TID) | ORAL | Status: DC
  Administered 2016-04-08 – 2016-04-09 (×3): 237 mL via ORAL

## 2016-04-08 MED ORDER — SODIUM CHLORIDE 0.9 % IV BOLUS (SEPSIS)
500.0000 mL | Freq: Once | INTRAVENOUS | Status: AC
  Administered 2016-04-08: 500 mL via INTRAVENOUS

## 2016-04-08 NOTE — Progress Notes (Signed)
[facility] NAME: [name]    MRN#:  [id number]  DATE OF BIRTH:  November 04, 1955  SUBJECTIVE:  Hospital Day: 0 days [name] is a 60 y.o. female presenting with Nausea; Emesis; and Diarrhea .   Overnight events: no overnight events Interval Events: Still complains of abdominal pain described as sharp, epigastric in location, non radiating but improving  REVIEW OF SYSTEMS:  CONSTITUTIONAL: No fever, fatigue or weakness.  EYES: No blurred or double vision.  EARS, NOSE, AND THROAT: No tinnitus or ear pain.  RESPIRATORY: No cough, shortness of breath, wheezing or hemoptysis.  CARDIOVASCULAR: No chest pain, orthopnea, edema.  GASTROINTESTINAL: improved nausea, vomiting, diarrhea and abdominal pain.  GENITOURINARY: No dysuria, hematuria.  ENDOCRINE: No polyuria, nocturia,  HEMATOLOGY: No anemia, easy bruising or bleeding SKIN: No rash or lesion. MUSCULOSKELETAL: No joint pain or arthritis.   NEUROLOGIC: No tingling, numbness, weakness.  PSYCHIATRY: No anxiety or depression.   DRUG ALLERGIES:   Allergies  Allergen Reactions  . Hydrocodone Other (See Comments)    Pt states that this medication caused cirrhosis of the liver.    . Aspirin   . Erythromycin Other (See Comments)    Reaction:  Fever   . Prednisone Other (See Comments)    Reaction:  Unknown   . Rosiglitazone Maleate Swelling  . Codeine Sulfate Rash  . Tetanus-Diphtheria Toxoids Td Rash and Other (See Comments)    Reaction:  Fever     VITALS:  Blood pressure 138/72, pulse (!) 119, temperature 99.1 F (37.3 C), temperature source Oral, resp. rate 16, height 5' 3"  (1.6 m), weight 137 lb (62.1 kg), SpO2 96 %.  PHYSICAL EXAMINATION:  VITAL SIGNS: Vitals:   04/07/16 2053 04/08/16 0516  BP: 133/73 138/72  Pulse: (!) 136 (!) 119  Resp: 20 16  Temp: 99.8 F (37.7 C) 99.1 F (37.3 C)   GENERAL:60 y.o.female currently in no acute distress.  HEAD:  Normocephalic, atraumatic.  EYES: Pupils equal, round, reactive to light. Extraocular muscles intact. No scleral icterus.  MOUTH: Moist mucosal membrane. Dentition intact. No abscess noted.  EAR, NOSE, THROAT: Clear without exudates. No external lesions.  NECK: Supple. No thyromegaly. No nodules. No JVD.  PULMONARY: Clear to ascultation, without wheeze rails or rhonci. No use of accessory muscles, Good respiratory effort. good air entry bilaterally CHEST: Nontender to palpation.  CARDIOVASCULAR: S1 and S2. Regular rate and rhythm. No murmurs, rubs, or gallops. No edema. Pedal pulses 2+ bilaterally.  GASTROINTESTINAL: Soft, nontender, nondistended. No masses. Positive bowel sounds. No hepatosplenomegaly.  MUSCULOSKELETAL: No swelling, clubbing, or edema. Range of motion full in all extremities.  NEUROLOGIC: Cranial nerves II through XII are intact. No gross focal neurological deficits. Sensation intact. Reflexes intact.  SKIN: No ulceration, lesions, rashes, or cyanosis. Skin warm and dry. Turgor intact.  PSYCHIATRIC: Mood, affect within normal limits. The patient is awake, alert and oriented x 3. Insight, judgment intact.      LABORATORY PANEL:   CBC  Recent Labs Lab 04/06/16 2321  WBC 5.6  HGB 10.4*  HCT 31.1*  PLT 122*   ------------------------------------------------------------------------------------------------------------------  Chemistries   Recent Labs Lab 04/06/16 2321  NA 140  K 3.2*  CL 109  CO2 21*  GLUCOSE 269*  BUN 19  CREATININE 0.63  CALCIUM 9.0  AST 26  ALT 26  ALKPHOS 108  BILITOT 1.1   ------------------------------------------------------------------------------------------------------------------  Cardiac Enzymes No results for input(s): TROPONINI in the last 168 hours. ------------------------------------------------------------------------------------------------------------------  RADIOLOGY:  No results found.  EKG:   Orders placed  or performed during the hospital encounter of 04/06/16  . EKG 12-Lead  . EKG 12-Lead    ASSESSMENT AND PLAN:   [name] is a 60 y.o. female presenting with Nausea; Emesis; and Diarrhea . Admitted 04/06/2016 : Day #: 0 days   1. Intractable nausea and vomiting: Secondary to gastroparesis. Continue Reglan, Bentyl and PPI. Had GI cocktail, supportive measures actually improving --turns out patient ran out of pain medication at home 3. Essential hypertension: Continue metoprolol. Add IV hydralazine if continues to need IV medications will add additional oral agents 4. Atrial fibrillation: Paroxysmal; continue Eliquis 5. Prolonged QT interval: This is a chronic issue. The patient is on multiple QT prolonging agents as an outpatient. 6. Diabetes mellitus insulin requiring continue the patient's basal insulin and place on sliding scale. 7. COPD: Continue inhaled corticosteroid as well as albuterol as needed. 8. Hypothyroidism: Continue Synthroid 9. Hyperlipidemia: Continue statin therapy 10. Cirrhosis of liver: Continue rifaximin as well as Lasix and spironolactone per home regimen 11. DVT prophylaxis: Full anticoagulation as above 12. GI prophylaxis: PPI per home regimen The patient is a DO NOT RESUSCITATE Advance diet and has been discharged to  All the records are reviewed and case discussed with Care Management/Social Workerr. Management plans discussed with the patient, family and they are in agreement.  CODE STATUS: full TOTAL TIME TAKING CARE OF THIS PATIENT: 28 minutes.   POSSIBLE D/C IN 1-2DAYS, DEPENDING ON CLINICAL CONDITION.   Khalin Royce,  Karenann Cai.D on 04/08/2016 at 12:00 PM  Between 7am to 6pm - Pager - [phone]  After 6pm: House Pager: - [facility]  Office  [phone]  CC: Primary care physician; No PCP Per Patient

## 2016-04-09 DIAGNOSIS — E1043 Type 1 diabetes mellitus with diabetic autonomic (poly)neuropathy: Secondary | ICD-10-CM | POA: Diagnosis not present

## 2016-04-09 DIAGNOSIS — I480 Paroxysmal atrial fibrillation: Secondary | ICD-10-CM | POA: Diagnosis not present

## 2016-04-09 DIAGNOSIS — R112 Nausea with vomiting, unspecified: Secondary | ICD-10-CM | POA: Diagnosis not present

## 2016-04-09 DIAGNOSIS — I10 Essential (primary) hypertension: Secondary | ICD-10-CM | POA: Diagnosis not present

## 2016-04-09 LAB — CBC
HCT: 27.6 % — ABNORMAL LOW (ref 35.0–47.0)
HEMOGLOBIN: 8.9 g/dL — AB (ref 12.0–16.0)
MCH: 25.5 pg — AB (ref 26.0–34.0)
MCHC: 32.4 g/dL (ref 32.0–36.0)
MCV: 78.6 fL — ABNORMAL LOW (ref 80.0–100.0)
PLATELETS: 100 10*3/uL — AB (ref 150–440)
RBC: 3.51 MIL/uL — ABNORMAL LOW (ref 3.80–5.20)
RDW: 18.9 % — ABNORMAL HIGH (ref 11.5–14.5)
WBC: 3.7 10*3/uL (ref 3.6–11.0)

## 2016-04-09 LAB — CREATININE, SERUM
CREATININE: 0.66 mg/dL (ref 0.44–1.00)
GFR calc Af Amer: >60 mL/min (ref >60)

## 2016-04-09 LAB — GLUCOSE, CAPILLARY: Glucose-Capillary: 177 mg/dL — ABNORMAL HIGH (ref 65–99)

## 2016-04-09 MED ORDER — INSULIN DETEMIR 100 UNIT/ML ~~LOC~~ SOLN: 5.0000 [IU] | Freq: Every day | SUBCUTANEOUS | 11 refills | Status: DC

## 2016-04-09 MED ORDER — PANTOPRAZOLE SODIUM 40 MG PO TBEC: 40.0000 mg | DELAYED_RELEASE_TABLET | Freq: Every day | ORAL | 0 refills | Status: DC

## 2016-04-09 MED ORDER — OXYCODONE HCL 5 MG PO TABS: 5.0000 mg | ORAL_TABLET | Freq: Four times a day (QID) | ORAL | 0 refills | Status: DC | PRN

## 2016-04-09 MED ORDER — GLUCERNA SHAKE PO LIQD: 237.0000 mL | Freq: Three times a day (TID) | ORAL | 0 refills | Status: DC

## 2016-04-09 MED ORDER — DICYCLOMINE HCL 10 MG PO CAPS: 10.0000 mg | ORAL_CAPSULE | Freq: Three times a day (TID) | ORAL | 0 refills | Status: DC

## 2016-04-09 MED ORDER — MORPHINE SULFATE ER 15 MG PO TBCR: 15.0000 mg | EXTENDED_RELEASE_TABLET | Freq: Three times a day (TID) | ORAL | 0 refills | Status: DC

## 2016-04-09 NOTE — Discharge Summary (Signed)
[facility] NAME: [name]    MR#:  [id number]  DATE OF BIRTH:  Sep 27, 1955  DATE OF ADMISSION:  04/06/2016 ADMITTING PHYSICIAN: Harrie Foreman, MD  DATE OF DISCHARGE: 04/09/16  PRIMARY CARE PHYSICIAN: No PCP Per Patient    ADMISSION DIAGNOSIS:  Gastroparesis [K31.84] Intractable vomiting with nausea, vomiting of unspecified type [R11.10]  DISCHARGE DIAGNOSIS:  Active Problems:   Intractable nausea and vomiting   SECONDARY DIAGNOSIS:   Past Medical History:  Diagnosis Date  . Allergy   . Anxiety   . Cancer (HCC)    HX OF CANCER OF UTERUS   . Cirrhosis of liver not due to alcohol ([place]) 2016  . Degenerative disk disease   . Diverticulitis   . Gastroparesis   . GERD (gastroesophageal reflux disease)   . History of hiatal hernia   . Hypertension   . Hypothyroid   . Intussusception intestine ([place]) 05/2015  . PAF (paroxysmal atrial fibrillation) ([place]) 03/2015   a. new onset 03/2015 in setting of intractable N/V; b. on Eliquis 5 mg bid; c. CHADSVASc 4 (DM, TIA x 2, female)  . Pancreatitis   . Sick sinus syndrome ([place])   . Stomach ulcer   . Stroke [facility])    with minimal left sided weakness  . Syncope 01/2015  . TIA (transient ischemic attack) 02/2015  . Type 1 diabetes [facility])    on levemir    HOSPITAL COURSE:  [name]  is a 60 y.o. female admitted 04/06/2016 with chief complaint Nausea; Emesis; and Diarrhea . Please see H&P performed by Harrie Foreman, MD for further information. Patient presented with the above symptoms including abdominal pain. These have been ongoing issues for her, but she recently lost follow up with PCP and GI, thus running out of medications. Her symptoms gradually improved after restarting home medications and providing supportive care.   DISCHARGE CONDITIONS:   stable  CONSULTS OBTAINED:    DRUG ALLERGIES:   Allergies  Allergen Reactions  . Hydrocodone Other (See  Comments)    Pt states that this medication caused cirrhosis of the liver.    . Aspirin   . Erythromycin Other (See Comments)    Reaction:  Fever   . Prednisone Other (See Comments)    Reaction:  Unknown   . Rosiglitazone Maleate Swelling  . Codeine Sulfate Rash  . Tetanus-Diphtheria Toxoids Td Rash and Other (See Comments)    Reaction:  Fever     DISCHARGE MEDICATIONS:   Current Discharge Medication List    START taking these medications   Details  feeding supplement, GLUCERNA SHAKE, (GLUCERNA SHAKE) LIQD Take 237 mLs by mouth 3 (three) times daily between meals. Qty: 30 Can, Refills: 0      CONTINUE these medications which have CHANGED   Details  dicyclomine (BENTYL) 10 MG capsule Take 1 capsule (10 mg total) by mouth 4 (four) times daily -  before meals and at bedtime. Qty: 120 capsule, Refills: 0    insulin detemir (LEVEMIR) 100 UNIT/ML injection Inject 0.05 mLs (5 Units total) into the skin at bedtime. Qty: 10 mL, Refills: 11    morphine (MS CONTIN) 15 MG 12 hr tablet Take 1 tablet (15 mg total) by mouth every 8 (eight) hours. Qty: 60 tablet, Refills: 0    oxyCODONE (OXY IR/ROXICODONE) 5 MG immediate release tablet Take 1-2 tablets (5-10 mg total) by mouth every 6 (six) hours as needed for severe pain. Qty:  30 tablet, Refills: 0    !! pantoprazole (PROTONIX) 40 MG tablet Take 1 tablet (40 mg total) by mouth daily. Qty: 30 tablet, Refills: 0     !! - Potential duplicate medications found. Please discuss with provider.    CONTINUE these medications which have NOT CHANGED   Details  acidophilus (RISAQUAD) CAPS capsule Take 2 capsules by mouth daily. Qty: 60 capsule, Refills: 0    albuterol (PROVENTIL HFA;VENTOLIN HFA) 108 (90 Base) MCG/ACT inhaler Inhale 2 puffs into the lungs every 6 (six) hours as needed for wheezing or shortness of breath. Qty: 1 Inhaler, Refills: 2    apixaban (ELIQUIS) 5 MG TABS tablet Take 1 tablet (5 mg total) by mouth 2 (two) times  daily. Qty: 60 tablet, Refills: 0    atorvastatin (LIPITOR) 40 MG tablet Take 40 mg by mouth at bedtime.    bismuth subsalicylate (PEPTO BISMOL) 262 MG/15ML suspension Take 30 mLs by mouth every 4 (four) hours as needed for diarrhea or loose stools. Qty: 360 mL, Refills: 0    budesonide-formoterol (SYMBICORT) 160-4.5 MCG/ACT inhaler Inhale 2 puffs into the lungs 2 (two) times daily. Qty: 1 Inhaler, Refills: 12    diphenhydrAMINE (BENADRYL) 25 mg capsule Take 2 capsules (50 mg total) by mouth every 6 (six) hours as needed. Qty: 60 capsule, Refills: 0    furosemide (LASIX) 20 MG tablet Take 1 tablet (20 mg total) by mouth daily. Qty: 30 tablet, Refills: 0    insulin aspart (NOVOLOG) 100 UNIT/ML injection Inject into the skin 3 (three) times daily with meals as needed for high blood sugar. Pt uses as needed per sliding scale.    levothyroxine (SYNTHROID, LEVOTHROID) 75 MCG tablet Take 1 tablet (75 mcg total) by mouth daily before breakfast. Qty: 30 tablet, Refills: 1    metoCLOPramide (REGLAN) 10 MG tablet Take 1 tablet (10 mg total) by mouth 4 (four) times daily -  before meals and at bedtime. Qty: 60 tablet, Refills: 0    metoprolol tartrate (LOPRESSOR) 25 MG tablet Take 25 mg by mouth 2 (two) times daily.     ondansetron (ZOFRAN ODT) 4 MG disintegrating tablet Take 1 tablet (4 mg total) by mouth every 8 (eight) hours as needed for nausea or vomiting. Qty: 20 tablet, Refills: 0    !! pantoprazole (PROTONIX) 40 MG tablet Take 40 mg by mouth 2 (two) times daily before a meal.    rifaximin (XIFAXAN) 550 MG TABS tablet Take 1 tablet (550 mg total) by mouth 2 (two) times daily. Qty: 60 tablet, Refills: 6    spironolactone (ALDACTONE) 25 MG tablet Take 1 tablet (25 mg total) by mouth daily. Qty: 30 tablet, Refills: 0    sucralfate (CARAFATE) 1 GM/10ML suspension Take 10 mLs (1 g total) by mouth 4 (four) times daily -  before meals and at bedtime. Qty: 420 mL, Refills: 0     tiZANidine (ZANAFLEX) 4 MG tablet Take 4 mg by mouth 3 (three) times daily as needed for muscle spasms.      !! - Potential duplicate medications found. Please discuss with provider.    STOP taking these medications     amoxicillin-clavulanate (AUGMENTIN) 875-125 MG tablet          DISCHARGE INSTRUCTIONS:    DIET:  Diabetic diet  DISCHARGE CONDITION:  Stable  ACTIVITY:  Activity as tolerated  OXYGEN:  Home Oxygen: No.   Oxygen Delivery: room air  DISCHARGE LOCATION:  home   If you experience worsening of your  admission symptoms, develop shortness of breath, life threatening emergency, suicidal or homicidal thoughts you must seek medical attention immediately by calling 911 or calling your MD immediately  if symptoms less severe.  You Must read complete instructions/literature along with all the possible adverse reactions/side effects for all the Medicines you take and that have been prescribed to you. Take any new Medicines after you have completely understood and accpet all the possible adverse reactions/side effects.   Please note  You were cared for by a hospitalist during your hospital stay. If you have any questions about your discharge medications or the care you received while you were in the hospital after you are discharged, you can call the unit and asked to speak with the hospitalist on call if the hospitalist that took care of you is not available. Once you are discharged, your primary care physician will handle any further medical issues. Please note that NO REFILLS for any discharge medications will be authorized once you are discharged, as it is imperative that you return to your primary care physician (or establish a relationship with a primary care physician if you do not have one) for your aftercare needs so that they can reassess your need for medications and monitor your lab values.    On the day of Discharge:   VITAL SIGNS:  Blood pressure (!)  119/57, pulse 99, temperature 98.2 F (36.8 C), temperature source Oral, resp. rate 16, height 5' 3"  (1.6 m), weight 139 lb 14.4 oz (63.5 kg), SpO2 99 %.  I/O:  No intake or output data in the 24 hours ending 04/09/16 0915  PHYSICAL EXAMINATION:  GENERAL:  60 y.o.-year-old patient lying in the bed with no acute distress.  EYES: Pupils equal, round, reactive to light and accommodation. No scleral icterus. Extraocular muscles intact.  HEENT: Head atraumatic, normocephalic. Oropharynx and nasopharynx clear.  NECK:  Supple, no jugular venous distention. No thyroid enlargement, no tenderness.  LUNGS: Normal breath sounds bilaterally, no wheezing, rales,rhonchi or crepitation. No use of accessory muscles of respiration.  CARDIOVASCULAR: S1, S2 normal. No murmurs, rubs, or gallops.  ABDOMEN: Soft, non-tender, non-distended. Bowel sounds present. No organomegaly or mass.  EXTREMITIES: No pedal edema, cyanosis, or clubbing.  NEUROLOGIC: Cranial nerves II through XII are intact. Muscle strength 5/5 in all extremities. Sensation intact. Gait not checked.  PSYCHIATRIC: The patient is alert and oriented x 3.  SKIN: No obvious rash, lesion, or ulcer.   DATA REVIEW:   CBC  Recent Labs Lab 04/09/16 0410  WBC 3.7  HGB 8.9*  HCT 27.6*  PLT 100*    Chemistries   Recent Labs Lab 04/06/16 2321 04/09/16 0410  NA 140  --   K 3.2*  --   CL 109  --   CO2 21*  --   GLUCOSE 269*  --   BUN 19  --   CREATININE 0.63 0.66  CALCIUM 9.0  --   AST 26  --   ALT 26  --   ALKPHOS 108  --   BILITOT 1.1  --     Cardiac Enzymes No results for input(s): TROPONINI in the last 168 hours.  Microbiology Results  Results for orders placed or performed during the hospital encounter of 03/13/16  C difficile quick scan w PCR reflex     Status: None   Collection Time: 03/15/16  6:33 PM  Result Value Ref Range Status   C Diff antigen NEGATIVE NEGATIVE Final   C Diff toxin NEGATIVE NEGATIVE  Final   C Diff  interpretation No C. difficile detected.  Final    RADIOLOGY:  No results found.   Management plans discussed with the patient, family and they are in agreement.  CODE STATUS:     Code Status Orders        Start     Ordered   04/07/16 [phone]  Do not attempt resuscitation (DNR)  Continuous    Question Answer Comment  In the event of cardiac or respiratory ARREST Do not call a "code blue"   In the event of cardiac or respiratory ARREST Do not perform Intubation, CPR, defibrillation or ACLS   In the event of cardiac or respiratory ARREST Use medication by any route, position, wound care, and other measures to relive pain and suffering. May use oxygen, suction and manual treatment of airway obstruction as needed for comfort.      04/07/16 0618    Code Status History    Date Active Date Inactive Code Status Order ID Comments User Context   03/13/2016  9:25 PM 03/16/2016  6:32 PM DNR [id number]  Lance Coon, MD Inpatient   02/04/2016  9:39 AM 02/07/2016  2:59 PM DNR [id number]  Saundra Shelling, MD Inpatient   01/21/2016 10:24 PM 01/25/2016  5:02 PM DNR [id number]  Lance Coon, MD Inpatient   01/15/2016  1:39 PM 01/19/2016  3:29 PM DNR [id number]  Gladstone Lighter, MD ED   01/10/2016 10:00 PM 01/13/2016  5:24 PM DNR [id number]  Harrie Foreman, MD Inpatient   12/27/2015 11:02 PM 01/01/2016  6:17 PM DNR [id number]  Max Sane, MD Inpatient   12/16/2015  4:54 PM 12/19/2015  4:52 PM DNR [id number]  Loletha Grayer, MD ED   11/11/2015 11:36 AM 11/16/2015  2:05 PM Full Code [id number]  Samella Parr, NP Inpatient   10/15/2015  1:23 AM 10/16/2015  6:17 PM Full Code [id number]  Lance Coon, MD Inpatient   10/04/2015  8:19 PM 10/08/2015  6:34 PM Full Code [id number]  Bethena Roys, MD Inpatient   08/15/2015  1:05 AM 08/16/2015  8:04 PM Full Code [id number]  Harrie Foreman, MD Inpatient   08/01/2015  1:25 AM 08/06/2015  5:41 PM Full Code [id number]  Harrie Foreman, MD Inpatient   07/04/2015  2:53 AM 07/11/2015  6:33 PM  Full Code [id number]  Lytle Butte, MD ED   05/27/2015 10:48 PM 05/31/2015  2:37 PM Full Code [id number]  Rise Patience, MD Inpatient   05/19/2015  1:14 AM 05/22/2015  4:28 PM Full Code [id number]  Rise Patience, MD Inpatient   04/05/2015  9:16 AM 04/07/2015  5:23 PM Full Code [id number]  Bettey Costa, MD Inpatient   04/03/2015 12:31 PM 04/05/2015  9:16 AM Full Code [id number]  Bettey Costa, MD Inpatient   03/11/2015  4:49 PM 03/14/2015  3:50 PM Full Code [id number]  Dustin Flock, MD Inpatient   02/15/2015  4:51 PM 02/17/2015  2:33 PM DNR [id number]  Dustin Flock, MD Inpatient   02/15/2015  4:35 PM 02/15/2015  4:51 PM Full Code [id number]  Dustin Flock, MD Inpatient   01/11/2015  2:40 AM 01/12/2015  6:11 PM Full Code [id number]  Etta Quill, DO ED      TOTAL TIME TAKING CARE OF THIS PATIENT: 32 minutes.    Esai Stecklein,  Karenann Cai.D on 04/09/2016 at 9:15 AM  Between 7am to 6pm - Pager - [phone]  After 6pm go to [email or web address] - Patent attorney  Hospitalists  Office  [phone]  CC: Primary care physician; No PCP Per Patient

## 2016-04-10 LAB — GLUCOSE, CAPILLARY: Glucose-Capillary: 183 mg/dL — ABNORMAL HIGH (ref 65–99)

## 2016-04-27 ENCOUNTER — Encounter: Payer: Self-pay | Admitting: Emergency Medicine

## 2016-04-27 ENCOUNTER — Emergency Department
Admission: EM | Admit: 2016-04-27 | Discharge: 2016-04-27 | Disposition: A | Discharge status: Home / Self Care | Payer: Commercial Managed Care - HMO | Attending: Emergency Medicine | Admitting: Emergency Medicine

## 2016-04-27 DIAGNOSIS — Z794 Long term (current) use of insulin: Secondary | ICD-10-CM | POA: Insufficient documentation

## 2016-04-27 DIAGNOSIS — K3184 Gastroparesis: Secondary | ICD-10-CM

## 2016-04-27 DIAGNOSIS — Z79899 Other long term (current) drug therapy: Secondary | ICD-10-CM | POA: Insufficient documentation

## 2016-04-27 DIAGNOSIS — R1013 Epigastric pain: Secondary | ICD-10-CM

## 2016-04-27 DIAGNOSIS — E039 Hypothyroidism, unspecified: Secondary | ICD-10-CM | POA: Diagnosis not present

## 2016-04-27 DIAGNOSIS — R112 Nausea with vomiting, unspecified: Secondary | ICD-10-CM | POA: Diagnosis not present

## 2016-04-27 DIAGNOSIS — I10 Essential (primary) hypertension: Secondary | ICD-10-CM | POA: Diagnosis not present

## 2016-04-27 DIAGNOSIS — E119 Type 2 diabetes mellitus without complications: Secondary | ICD-10-CM | POA: Diagnosis not present

## 2016-04-27 DIAGNOSIS — Z8542 Personal history of malignant neoplasm of other parts of uterus: Secondary | ICD-10-CM | POA: Diagnosis not present

## 2016-04-27 DIAGNOSIS — K2970 Gastritis, unspecified, without bleeding: Secondary | ICD-10-CM | POA: Diagnosis not present

## 2016-04-27 LAB — COMPREHENSIVE METABOLIC PANEL
ALBUMIN: 3.7 g/dL (ref 3.5–5.0)
ALK PHOS: 102 U/L (ref 38–126)
ALT: 18 U/L (ref 14–54)
AST: 24 U/L (ref 15–41)
Anion gap: 8 (ref 5–15)
BILIRUBIN TOTAL: 0.8 mg/dL (ref 0.3–1.2)
BUN: 20 mg/dL (ref 6–20)
CALCIUM: 9 mg/dL (ref 8.9–10.3)
CO2: 22 mmol/L (ref 22–32)
CREATININE: 0.57 mg/dL (ref 0.44–1.00)
Chloride: 106 mmol/L (ref 101–111)
GFR calc non Af Amer: >60 mL/min (ref >60)
GLUCOSE: 243 mg/dL — AB (ref 65–99)
Potassium: 3.4 mmol/L — ABNORMAL LOW (ref 3.5–5.1)
SODIUM: 136 mmol/L (ref 135–145)
Total Protein: 7.3 g/dL (ref 6.5–8.1)

## 2016-04-27 LAB — CBC
HCT: 34 % — ABNORMAL LOW (ref 35.0–47.0)
Hemoglobin: 11.1 g/dL — ABNORMAL LOW (ref 12.0–16.0)
MCH: 25.2 pg — AB (ref 26.0–34.0)
MCHC: 32.6 g/dL (ref 32.0–36.0)
MCV: 77 fL — ABNORMAL LOW (ref 80.0–100.0)
PLATELETS: 156 10*3/uL (ref 150–440)
RBC: 4.42 MIL/uL (ref 3.80–5.20)
RDW: 19.1 % — ABNORMAL HIGH (ref 11.5–14.5)
WBC: 6.5 10*3/uL (ref 3.6–11.0)

## 2016-04-27 LAB — LIPASE, BLOOD: Lipase: 27 U/L (ref 11–51)

## 2016-04-27 MED ORDER — ONDANSETRON HCL 4 MG/2ML IJ SOLN: 8.0000 mg | Freq: Once | INTRAMUSCULAR | Status: DC

## 2016-04-27 MED ORDER — MORPHINE SULFATE (PF) 2 MG/ML IV SOLN
2.0000 mg | Freq: Once | INTRAVENOUS | Status: AC
  Administered 2016-04-27: 2 mg via INTRAVENOUS
  Filled 2016-04-27: qty 1

## 2016-04-27 MED ORDER — SODIUM CHLORIDE 0.9 % IV BOLUS (SEPSIS)
1000.0000 mL | Freq: Once | INTRAVENOUS | Status: AC
  Administered 2016-04-27: 1000 mL via INTRAVENOUS

## 2016-04-27 MED ORDER — ONDANSETRON HCL 4 MG/2ML IJ SOLN
INTRAMUSCULAR | Status: AC
  Filled 2016-04-27: qty 2

## 2016-04-27 MED ORDER — METOCLOPRAMIDE HCL 5 MG/ML IJ SOLN
INTRAMUSCULAR | Status: AC
  Administered 2016-04-27: 10 mg via INTRAVENOUS
  Filled 2016-04-27: qty 2

## 2016-04-27 MED ORDER — METOCLOPRAMIDE HCL 5 MG/ML IJ SOLN
10.0000 mg | Freq: Once | INTRAMUSCULAR | Status: AC
  Administered 2016-04-27: 10 mg via INTRAVENOUS

## 2016-04-27 MED ORDER — ONDANSETRON HCL 4 MG/2ML IJ SOLN
INTRAMUSCULAR | Status: AC
  Administered 2016-04-27: 8 mg
  Filled 2016-04-27: qty 4

## 2016-04-27 NOTE — ED Provider Notes (Signed)
[facility] Emergency Department Provider Note _______   None    (approximate)  I have reviewed the triage vital signs and the nursing notes.   HISTORY  Chief Complaint Abdominal Pain   HPI [name] is a 60 y.o. female returns to the emergency department with five-day history of generalized abdominal pain that is currently 10 out of 10 associated with nausea and nonbloody emesis. Patient was recently admitted to the hospital for the same and diagnosed to gastroparesis. Patient states nausea is not controlled with home Zofran prescription.   Past Medical History:  Diagnosis Date  . Allergy   . Anxiety   . Cancer (HCC)    HX OF CANCER OF UTERUS   . Cirrhosis of liver not due to alcohol ([place]) 2016  . Degenerative disk disease   . Diverticulitis   . Gastroparesis   . GERD (gastroesophageal reflux disease)   . History of hiatal hernia   . Hypertension   . Hypothyroid   . Intussusception intestine ([place]) 05/2015  . PAF (paroxysmal atrial fibrillation) ([place]) 03/2015   a. new onset 03/2015 in setting of intractable N/V; b. on Eliquis 5 mg bid; c. CHADSVASc 4 (DM, TIA x 2, female)  . Pancreatitis   . Sick sinus syndrome ([place])   . Stomach ulcer   . Stroke [facility])    with minimal left sided weakness  . Syncope 01/2015  . TIA (transient ischemic attack) 02/2015  . Type 1 diabetes ([place])    on levemir    Patient Active Problem List   Diagnosis Date Noted  . Epigastric pain   . Abdominal pain, epigastric   . Personal history of surgery to heart and great vessels, presenting hazards to health   . Gastritis   . Foreign body in stomach   . Abnormal findings-gastrointestinal tract   . Diarrhea   . Congestive dilated cardiomyopathy ([place])   . Generalized abdominal pain   . Nausea & vomiting   . NSTEMI (non-ST elevated myocardial infarction) ([place]) 01/11/2016  . Tachyarrhythmia 01/10/2016  . Intractable nausea and vomiting 01/10/2016  . Tachy-brady  syndrome ([place]) 12/28/2015  . Abdominal pain 12/27/2015  . Colitis 12/16/2015  . Pneumonia 11/14/2015  . Narcotic withdrawal ([place]) 11/11/2015  . Diabetes mellitus type 2, insulin dependent ([place]) 11/11/2015  . Orthostatic hypotension   . H/O TIA (transient ischemic attack) and stroke   . Left-sided weakness 10/04/2015  . Expressive aphasia 10/04/2015  . Ileus ([place]) 08/01/2015  . Ascites   . Cryptogenic cirrhosis ([place]) 07/10/2015  . Paroxysmal atrial fibrillation ([place]) 07/10/2015  . C. difficile colitis 07/10/2015  . GI (gastrointestinal bleed) 07/06/2015  . Malnutrition of moderate degree 07/04/2015  . Symptomatic bradycardia 05/27/2015  . Intussusception intestine ([place])   . Hypothyroidism due to amiodarone   . Abdominal pain, chronic, epigastric   . Chronic anemia 05/19/2015  . Thrombocytopenia ([place]) 05/19/2015  . Prolonged QT interval   . Hypomagnesemia   . Narcotic abuse   . Uncontrolled type 2 diabetes mellitus with gastroparesis ([place])   . Syncope due to orthostatic hypotension 05/18/2015  . Hypokalemia 04/06/2015  . Hyperlipidemia with target LDL less than 100 04/06/2015  . CVA (cerebral infarction) 02/15/2015  . Essential hypertension 01/12/2015  . Chronically on opiate therapy 01/12/2015  . Gastroparesis 01/12/2015  . DEPRESSION/ANXIETY 06/27/2007  . MYOFASCIAL PAIN SYNDROME 06/27/2007  . Chronic pain syndrome 03/28/2007  . GERD 03/27/2007  . DIVERTICULOSIS, COLON 03/27/2007  . LUMBAR DISC DISPLACEMENT 03/27/2007  .  PROTEINURIA 03/27/2007  . UTERINE CANCER, HX OF 03/27/2007    Past Surgical History:  Procedure Laterality Date  . ABDOMINAL HYSTERECTOMY    . CARDIAC CATHETERIZATION N/A 01/12/2016   Procedure: Left Heart Cath and Coronary Angiography;  Surgeon: Wellington Hampshire, MD;  Location: [place] [facility];  Service: Cardiovascular;  Laterality: N/A;  . CHOLECYSTECTOMY    . ESOPHAGOGASTRODUODENOSCOPY N/A 04/04/2015   Procedure: ESOPHAGOGASTRODUODENOSCOPY  (EGD);  Surgeon: Hulen Luster, MD;  Location: [facility] ENDOSCOPY;  Service: Endoscopy;  Laterality: N/A;  . ESOPHAGOGASTRODUODENOSCOPY (EGD) WITH PROPOFOL N/A 01/18/2016   Procedure: ESOPHAGOGASTRODUODENOSCOPY (EGD) WITH PROPOFOL;  Surgeon: Lucilla Lame, MD;  Location: [facility] ENDOSCOPY;  Service: Endoscopy;  Laterality: N/A;  . FLEXIBLE SIGMOIDOSCOPY N/A 01/18/2016   Procedure: FLEXIBLE SIGMOIDOSCOPY;  Surgeon: Lucilla Lame, MD;  Location: [facility] ENDOSCOPY;  Service: Endoscopy;  Laterality: N/A;  . HERNIA REPAIR      Prior to Admission medications   Medication Sig Start Date End Date Taking? Authorizing Provider  acidophilus (RISAQUAD) CAPS capsule Take 2 capsules by mouth daily. 01/25/16   Bettey Costa, MD  albuterol (PROVENTIL HFA;VENTOLIN HFA) 108 (90 Base) MCG/ACT inhaler Inhale 2 puffs into the lungs every 6 (six) hours as needed for wheezing or shortness of breath. 12/19/15   Gladstone Lighter, MD  apixaban (ELIQUIS) 5 MG TABS tablet Take 1 tablet (5 mg total) by mouth 2 (two) times daily. 04/07/15   Bettey Costa, MD  atorvastatin (LIPITOR) 40 MG tablet Take 40 mg by mouth at bedtime.    Historical Provider, MD  bismuth subsalicylate (PEPTO BISMOL) 262 MG/15ML suspension Take 30 mLs by mouth every 4 (four) hours as needed for diarrhea or loose stools. 01/25/16   Bettey Costa, MD  budesonide-formoterol (SYMBICORT) 160-4.5 MCG/ACT inhaler Inhale 2 puffs into the lungs 2 (two) times daily. 12/19/15   Gladstone Lighter, MD  dicyclomine (BENTYL) 10 MG capsule Take 1 capsule (10 mg total) by mouth 4 (four) times daily -  before meals and at bedtime. 04/09/16   Lytle Butte, MD  diphenhydrAMINE (BENADRYL) 25 mg capsule Take 2 capsules (50 mg total) by mouth every 6 (six) hours as needed. 02/09/16   Carrie Mew, MD  feeding supplement, GLUCERNA SHAKE, (GLUCERNA SHAKE) LIQD Take 237 mLs by mouth 3 (three) times daily between meals. 04/09/16   Lytle Butte, MD  furosemide (LASIX) 20 MG tablet Take 1 tablet (20 mg total) by  mouth daily. 02/07/16   Srikar Sudini, MD  insulin aspart (NOVOLOG) 100 UNIT/ML injection Inject into the skin 3 (three) times daily with meals as needed for high blood sugar. Pt uses as needed per sliding scale.    Historical Provider, MD  insulin detemir (LEVEMIR) 100 UNIT/ML injection Inject 0.05 mLs (5 Units total) into the skin at bedtime. 04/09/16   Lytle Butte, MD  levothyroxine (SYNTHROID, LEVOTHROID) 75 MCG tablet Take 1 tablet (75 mcg total) by mouth daily before breakfast. 10/08/15   Loleta Chance, MD  metoCLOPramide (REGLAN) 10 MG tablet Take 1 tablet (10 mg total) by mouth 4 (four) times daily -  before meals and at bedtime. 02/09/16   Carrie Mew, MD  metoprolol tartrate (LOPRESSOR) 25 MG tablet Take 25 mg by mouth 2 (two) times daily.     Historical Provider, MD  morphine (MS CONTIN) 15 MG 12 hr tablet Take 1 tablet (15 mg total) by mouth every 8 (eight) hours. 04/09/16   Lytle Butte, MD  ondansetron (ZOFRAN ODT) 4 MG disintegrating tablet Take 1  tablet (4 mg total) by mouth every 8 (eight) hours as needed for nausea or vomiting. 02/09/16   Carrie Mew, MD  oxyCODONE (OXY IR/ROXICODONE) 5 MG immediate release tablet Take 1-2 tablets (5-10 mg total) by mouth every 6 (six) hours as needed for severe pain. 04/09/16   Lytle Butte, MD  pantoprazole (PROTONIX) 40 MG tablet Take 40 mg by mouth 2 (two) times daily before a meal.    Historical Provider, MD  pantoprazole (PROTONIX) 40 MG tablet Take 1 tablet (40 mg total) by mouth daily. 04/09/16   Lytle Butte, MD  rifaximin (XIFAXAN) 550 MG TABS tablet Take 1 tablet (550 mg total) by mouth 2 (two) times daily. 07/11/15   Theodoro Grist, MD  spironolactone (ALDACTONE) 25 MG tablet Take 1 tablet (25 mg total) by mouth daily. 02/07/16   Srikar Sudini, MD  sucralfate (CARAFATE) 1 GM/10ML suspension Take 10 mLs (1 g total) by mouth 4 (four) times daily -  before meals and at bedtime. 03/16/16   Bettey Costa, MD  tiZANidine (ZANAFLEX) 4 MG tablet Take  4 mg by mouth 3 (three) times daily as needed for muscle spasms.     Historical Provider, MD    Allergies Hydrocodone; Aspirin; Erythromycin; Prednisone; Rosiglitazone maleate; Codeine sulfate; and Tetanus-diphtheria toxoids td  Family History  Problem Relation Age of Onset  . Hypertension Mother   . CAD Sister   . Heart attack Sister     Deceased November 24, 2014  . CAD Brother     Social History Social History  Substance Use Topics  . Smoking status: Never Smoker  . Smokeless tobacco: Never Used  . Alcohol use No    Review of Systems Constitutional: No fever/chills Eyes: No visual changes. ENT: No sore throat. Cardiovascular: Denies chest pain. Respiratory: Denies shortness of breath. Gastrointestinal: Positive for abdominal pain nausea and vomiting Genitourinary: Negative for dysuria. Musculoskeletal: Negative for back pain. Skin: Negative for rash. Neurological: Negative for headaches, focal weakness or numbness.  10-point ROS otherwise negative.  ____________________________________________   PHYSICAL EXAM:  VITAL SIGNS: ED Triage Vitals  Enc Vitals Group     BP 04/27/16 0105 (!) 176/98     Pulse Rate 04/27/16 0105 (!) 124     Resp 04/27/16 0105 18     Temp 04/27/16 0105 98.4 F (36.9 C)     Temp Source 04/27/16 0105 Oral     SpO2 04/27/16 0105 100 %     Weight 04/27/16 0108 132 lb (59.9 kg)     Height 04/27/16 0108 5' 3"  (1.6 m)     Head Circumference --      Peak Flow --      Pain Score 04/27/16 0108 10     Pain Loc --      Pain Edu? --      Excl. in [place]? --     Constitutional: Alert and oriented. Well appearing and in no acute distress. Eyes: Conjunctivae are normal. PERRL. EOMI. Head: Atraumatic. Ears:  Healthy appearing ear canals and TMs bilaterally Nose: No congestion/rhinnorhea. Mouth/Throat: Mucous membranes are moist.  Oropharynx non-erythematous. Neck: No stridor.  No meningeal signs.   Cardiovascular: Normal rate, regular rhythm. Good  peripheral circulation. Grossly normal heart sounds.   Respiratory: Normal respiratory effort.  No retractions. Lungs CTAB. Gastrointestinal: Soft and nontender. No distention.   Musculoskeletal: No lower extremity tenderness nor edema. No gross deformities of extremities. Neurologic:  Normal speech and language. No gross focal neurologic deficits are appreciated.  Skin:  Skin is warm, dry and intact. No rash noted. Psychiatric: Mood and affect are normal. Speech and behavior are normal.  ____________________________________________   LABS (all labs ordered are listed, but only abnormal results are displayed)  Labs Reviewed  CBC - Abnormal; Notable for the following:       Result Value   Hemoglobin 11.1 (*)    HCT 34.0 (*)    MCV 77.0 (*)    MCH 25.2 (*)    RDW 19.1 (*)    All other components within normal limits  LIPASE, BLOOD  COMPREHENSIVE METABOLIC PANEL  URINALYSIS COMPLETEWITH MICROSCOPIC ([facility] ONLY)   ____________________________________________  EKG  ED ECG REPORT I, [place] Wilbur [place]uda, the attending physician, personally viewed and interpreted this ECG.   Date: 04/27/2016  EKG Time: 1:07 AM  Rate: 127  Rhythm: Normal sinus rhythm  Axis: Normal  Intervals: Normal  ST&T Change: None  __________________________ __ Procedures      INITIAL IMPRESSION / ASSESSMENT AND PLAN / ED COURSE  Pertinent labs & imaging results that were available during my care of the patient were reviewed by me and considered in my medical decision making (see chart for details).  Patient given IV morphine and Reglan with improvement of pain. Patient advised to follow-up with primary care provider. History physical exam consistent with gastroparesis  Clinical Course    ____________________________________________  FINAL CLINICAL IMPRESSION(S) / ED DIAGNOSES  Final diagnoses:  Epigastric pain  Gastroparesis     MEDICATIONS GIVEN DURING THIS VISIT:  Medications    ondansetron (ZOFRAN) injection 8 mg (not administered)  ondansetron (ZOFRAN) 4 MG/2ML injection (8 mg  Given 04/27/16 0125)  sodium chloride 0.9 % bolus 1,000 mL (1,000 mLs Intravenous New Bag/Given 04/27/16 0125)  metoCLOPramide (REGLAN) injection 10 mg (10 mg Intravenous Given 04/27/16 0125)     NEW OUTPATIENT MEDICATIONS STARTED DURING THIS VISIT:  New Prescriptions   No medications on file      Note:  This document was prepared using Dragon voice recognition software and may include unintentional dictation errors.    Gregor Hams, MD 04/27/16 [phone]

## 2016-04-27 NOTE — ED Triage Notes (Signed)
Pt to ED via EMS from home for abd pain and n/v/d x5days per pt. Per EMS pt was seen here xfew ago for same symptoms.VS stable per EMS 182/82, 108, 99% on RA, cbg 221. Pt A&O, nauseated at this time

## 2016-05-11 ENCOUNTER — Emergency Department: Payer: Commercial Managed Care - HMO

## 2016-05-11 ENCOUNTER — Inpatient Hospital Stay
Admission: EM | Admit: 2016-05-11 | Discharge: 2016-05-15 | DRG: 074 | Disposition: A | Discharge status: Home / Self Care | Payer: Commercial Managed Care - HMO | Attending: Internal Medicine | Admitting: Internal Medicine

## 2016-05-11 ENCOUNTER — Encounter: Payer: Self-pay | Admitting: Emergency Medicine

## 2016-05-11 DIAGNOSIS — R000 Tachycardia, unspecified: Secondary | ICD-10-CM | POA: Diagnosis present

## 2016-05-11 DIAGNOSIS — R112 Nausea with vomiting, unspecified: Secondary | ICD-10-CM | POA: Diagnosis not present

## 2016-05-11 DIAGNOSIS — Z8249 Family history of ischemic heart disease and other diseases of the circulatory system: Secondary | ICD-10-CM | POA: Diagnosis not present

## 2016-05-11 DIAGNOSIS — Z8673 Personal history of transient ischemic attack (TIA), and cerebral infarction without residual deficits: Secondary | ICD-10-CM | POA: Diagnosis not present

## 2016-05-11 DIAGNOSIS — I959 Hypotension, unspecified: Secondary | ICD-10-CM | POA: Diagnosis not present

## 2016-05-11 DIAGNOSIS — Z8614 Personal history of Methicillin resistant Staphylococcus aureus infection: Secondary | ICD-10-CM | POA: Diagnosis not present

## 2016-05-11 DIAGNOSIS — T45516A Underdosing of anticoagulants, initial encounter: Secondary | ICD-10-CM | POA: Diagnosis present

## 2016-05-11 DIAGNOSIS — I10 Essential (primary) hypertension: Secondary | ICD-10-CM | POA: Diagnosis present

## 2016-05-11 DIAGNOSIS — T466X6A Underdosing of antihyperlipidemic and antiarteriosclerotic drugs, initial encounter: Secondary | ICD-10-CM | POA: Diagnosis present

## 2016-05-11 DIAGNOSIS — G894 Chronic pain syndrome: Secondary | ICD-10-CM | POA: Diagnosis not present

## 2016-05-11 DIAGNOSIS — R188 Other ascites: Secondary | ICD-10-CM | POA: Diagnosis not present

## 2016-05-11 DIAGNOSIS — E785 Hyperlipidemia, unspecified: Secondary | ICD-10-CM | POA: Diagnosis present

## 2016-05-11 DIAGNOSIS — K3184 Gastroparesis: Secondary | ICD-10-CM

## 2016-05-11 DIAGNOSIS — E039 Hypothyroidism, unspecified: Secondary | ICD-10-CM | POA: Diagnosis present

## 2016-05-11 DIAGNOSIS — T383X6A Underdosing of insulin and oral hypoglycemic [antidiabetic] drugs, initial encounter: Secondary | ICD-10-CM | POA: Diagnosis present

## 2016-05-11 DIAGNOSIS — R1010 Upper abdominal pain, unspecified: Secondary | ICD-10-CM | POA: Diagnosis not present

## 2016-05-11 DIAGNOSIS — J449 Chronic obstructive pulmonary disease, unspecified: Secondary | ICD-10-CM | POA: Diagnosis present

## 2016-05-11 DIAGNOSIS — K2970 Gastritis, unspecified, without bleeding: Secondary | ICD-10-CM | POA: Diagnosis not present

## 2016-05-11 DIAGNOSIS — T381X6A Underdosing of thyroid hormones and substitutes, initial encounter: Secondary | ICD-10-CM | POA: Diagnosis present

## 2016-05-11 DIAGNOSIS — I495 Sick sinus syndrome: Secondary | ICD-10-CM | POA: Diagnosis not present

## 2016-05-11 DIAGNOSIS — Z91128 Patient's intentional underdosing of medication regimen for other reason: Secondary | ICD-10-CM

## 2016-05-11 DIAGNOSIS — E860 Dehydration: Secondary | ICD-10-CM | POA: Diagnosis not present

## 2016-05-11 DIAGNOSIS — K7460 Unspecified cirrhosis of liver: Secondary | ICD-10-CM | POA: Diagnosis present

## 2016-05-11 DIAGNOSIS — K529 Noninfective gastroenteritis and colitis, unspecified: Secondary | ICD-10-CM | POA: Diagnosis present

## 2016-05-11 DIAGNOSIS — R1111 Vomiting without nausea: Secondary | ICD-10-CM | POA: Diagnosis not present

## 2016-05-11 DIAGNOSIS — I480 Paroxysmal atrial fibrillation: Secondary | ICD-10-CM | POA: Diagnosis not present

## 2016-05-11 DIAGNOSIS — T450X6A Underdosing of antiallergic and antiemetic drugs, initial encounter: Secondary | ICD-10-CM | POA: Diagnosis present

## 2016-05-11 DIAGNOSIS — I4581 Long QT syndrome: Secondary | ICD-10-CM | POA: Diagnosis present

## 2016-05-11 DIAGNOSIS — E1143 Type 2 diabetes mellitus with diabetic autonomic (poly)neuropathy: Principal | ICD-10-CM | POA: Diagnosis present

## 2016-05-11 DIAGNOSIS — T447X6A Underdosing of beta-adrenoreceptor antagonists, initial encounter: Secondary | ICD-10-CM | POA: Diagnosis present

## 2016-05-11 DIAGNOSIS — K219 Gastro-esophageal reflux disease without esophagitis: Secondary | ICD-10-CM | POA: Diagnosis present

## 2016-05-11 DIAGNOSIS — T501X6A Underdosing of loop [high-ceiling] diuretics, initial encounter: Secondary | ICD-10-CM | POA: Diagnosis present

## 2016-05-11 DIAGNOSIS — R1110 Vomiting, unspecified: Secondary | ICD-10-CM

## 2016-05-11 DIAGNOSIS — T380X6A Underdosing of glucocorticoids and synthetic analogues, initial encounter: Secondary | ICD-10-CM | POA: Diagnosis present

## 2016-05-11 LAB — COMPREHENSIVE METABOLIC PANEL
ALBUMIN: 4.4 g/dL (ref 3.5–5.0)
ALK PHOS: 122 U/L (ref 38–126)
ALT: 41 U/L (ref 14–54)
AST: 76 U/L — AB (ref 15–41)
Anion gap: 14 (ref 5–15)
BILIRUBIN TOTAL: 1 mg/dL (ref 0.3–1.2)
BUN: 20 mg/dL (ref 6–20)
CALCIUM: 9.6 mg/dL (ref 8.9–10.3)
CO2: 20 mmol/L — ABNORMAL LOW (ref 22–32)
Chloride: 104 mmol/L (ref 101–111)
Creatinine, Ser: 0.89 mg/dL (ref 0.44–1.00)
GFR calc Af Amer: >60 mL/min (ref >60)
GFR calc non Af Amer: >60 mL/min (ref >60)
GLUCOSE: 354 mg/dL — AB (ref 65–99)
Potassium: 4.9 mmol/L (ref 3.5–5.1)
Sodium: 138 mmol/L (ref 135–145)
TOTAL PROTEIN: 7.8 g/dL (ref 6.5–8.1)

## 2016-05-11 LAB — URINALYSIS COMPLETE WITH MICROSCOPIC (ARMC ONLY)
Bilirubin Urine: NEGATIVE
Leukocytes, UA: NEGATIVE
NITRITE: NEGATIVE
Protein, ur: 30 mg/dL — AB
SPECIFIC GRAVITY, URINE: 1.025 (ref 1.005–1.030)
pH: 5 (ref 5.0–8.0)

## 2016-05-11 LAB — CBC
HEMATOCRIT: 34.3 % — AB (ref 35.0–47.0)
Hemoglobin: 11.2 g/dL — ABNORMAL LOW (ref 12.0–16.0)
MCH: 25.3 pg — ABNORMAL LOW (ref 26.0–34.0)
MCHC: 32.6 g/dL (ref 32.0–36.0)
MCV: 77.4 fL — ABNORMAL LOW (ref 80.0–100.0)
Platelets: 145 10*3/uL — ABNORMAL LOW (ref 150–440)
RBC: 4.43 MIL/uL (ref 3.80–5.20)
RDW: 18.7 % — AB (ref 11.5–14.5)
WBC: 6.9 10*3/uL (ref 3.6–11.0)

## 2016-05-11 LAB — AMMONIA: AMMONIA: 11 umol/L (ref 9–35)

## 2016-05-11 LAB — LIPASE, BLOOD: Lipase: 26 U/L (ref 11–51)

## 2016-05-11 MED ORDER — FENTANYL CITRATE (PF) 100 MCG/2ML IJ SOLN
50.0000 ug | INTRAMUSCULAR | Status: DC | PRN
  Administered 2016-05-11 – 2016-05-12 (×5): 50 ug via INTRAVENOUS
  Filled 2016-05-11 (×4): qty 2

## 2016-05-11 MED ORDER — LORAZEPAM 2 MG/ML IJ SOLN
1.0000 mg | Freq: Once | INTRAMUSCULAR | Status: AC
  Administered 2016-05-11: 1 mg via INTRAVENOUS
  Filled 2016-05-11: qty 1

## 2016-05-11 MED ORDER — METOCLOPRAMIDE HCL 5 MG/ML IJ SOLN
10.0000 mg | Freq: Once | INTRAMUSCULAR | Status: AC
  Administered 2016-05-11: 10 mg via INTRAVENOUS
  Filled 2016-05-11: qty 2

## 2016-05-11 MED ORDER — ONDANSETRON HCL 4 MG/2ML IJ SOLN
4.0000 mg | Freq: Once | INTRAMUSCULAR | Status: AC | PRN
  Administered 2016-05-13: 13:00:00 4 mg via INTRAVENOUS
  Filled 2016-05-11 (×2): qty 2

## 2016-05-11 NOTE — ED Provider Notes (Signed)
[facility] Emergency Department Provider Note    First MD Initiated Contact with Patient 05/11/16 2056     (approximate)  I have reviewed the triage vital signs and the nursing notes.   HISTORY  Chief Complaint Nausea; Diarrhea; and Emesis    HPI [name] is a 60 y.o. female with 4 days of recurrent intractable nausea and vomiting. Patient with a history of gastroparesis requiring multiple admissions in the past. States that this feels the same but with worsening discomfort. Has not been able to keep any medications down for the past 4 days. She denies any chest pain or shortness of breath.   Past Medical History:  Diagnosis Date  . Allergy   . Anxiety   . Cancer (HCC)    HX OF CANCER OF UTERUS   . Cirrhosis of liver not due to alcohol ([place]) 2016  . Degenerative disk disease   . Diverticulitis   . Gastroparesis   . GERD (gastroesophageal reflux disease)   . History of hiatal hernia   . Hypertension   . Hypothyroid   . Intussusception intestine ([place]) 05/2015  . PAF (paroxysmal atrial fibrillation) ([place]) 03/2015   a. new onset 03/2015 in setting of intractable N/V; b. on Eliquis 5 mg bid; c. CHADSVASc 4 (DM, TIA x 2, female)  . Pancreatitis   . Sick sinus syndrome ([place])   . Stomach ulcer   . Stroke [facility])    with minimal left sided weakness  . Syncope 01/2015  . TIA (transient ischemic attack) 02/2015  . Type 1 diabetes ([place])    on levemir    Patient Active Problem List   Diagnosis Date Noted  . Epigastric pain   . Abdominal pain, epigastric   . Personal history of surgery to heart and great vessels, presenting hazards to health   . Gastritis   . Foreign body in stomach   . Abnormal findings-gastrointestinal tract   . Diarrhea   . Congestive dilated cardiomyopathy ([place])   . Generalized abdominal pain   . Nausea & vomiting   . NSTEMI (non-ST elevated myocardial infarction) ([place]) 01/11/2016  . Tachyarrhythmia 01/10/2016  .  Intractable nausea and vomiting 01/10/2016  . Tachy-brady syndrome ([place]) 12/28/2015  . Abdominal pain 12/27/2015  . Colitis 12/16/2015  . Pneumonia 11/14/2015  . Narcotic withdrawal ([place]) 11/11/2015  . Diabetes mellitus type 2, insulin dependent ([place]) 11/11/2015  . Orthostatic hypotension   . H/O TIA (transient ischemic attack) and stroke   . Left-sided weakness 10/04/2015  . Expressive aphasia 10/04/2015  . Ileus ([place]) 08/01/2015  . Ascites   . Cryptogenic cirrhosis ([place]) 07/10/2015  . Paroxysmal atrial fibrillation ([place]) 07/10/2015  . C. difficile colitis 07/10/2015  . GI (gastrointestinal bleed) 07/06/2015  . Malnutrition of moderate degree 07/04/2015  . Symptomatic bradycardia 05/27/2015  . Intussusception intestine ([place])   . Hypothyroidism due to amiodarone   . Abdominal pain, chronic, epigastric   . Chronic anemia 05/19/2015  . Thrombocytopenia ([place]) 05/19/2015  . Prolonged QT interval   . Hypomagnesemia   . Narcotic abuse   . Uncontrolled type 2 diabetes mellitus with gastroparesis ([place])   . Syncope due to orthostatic hypotension 05/18/2015  . Hypokalemia 04/06/2015  . Hyperlipidemia with target LDL less than 100 04/06/2015  . CVA (cerebral infarction) 02/15/2015  . Essential hypertension 01/12/2015  . Chronically on opiate therapy 01/12/2015  . Gastroparesis 01/12/2015  . DEPRESSION/ANXIETY 06/27/2007  . MYOFASCIAL PAIN SYNDROME 06/27/2007  . Chronic pain syndrome 03/28/2007  .  GERD 03/27/2007  . DIVERTICULOSIS, COLON 03/27/2007  . LUMBAR DISC DISPLACEMENT 03/27/2007  . PROTEINURIA 03/27/2007  . UTERINE CANCER, HX OF 03/27/2007    Past Surgical History:  Procedure Laterality Date  . ABDOMINAL HYSTERECTOMY    . CARDIAC CATHETERIZATION N/A 01/12/2016   Procedure: Left Heart Cath and Coronary Angiography;  Surgeon: Wellington Hampshire, MD;  Location: [place] CV LAB;  Service: Cardiovascular;  Laterality: N/A;  . CHOLECYSTECTOMY    . ESOPHAGOGASTRODUODENOSCOPY  N/A 04/04/2015   Procedure: ESOPHAGOGASTRODUODENOSCOPY (EGD);  Surgeon: Hulen Luster, MD;  Location[facility] ENDOSCOPY;  Service: Endoscopy;  Laterality: N/A;  . ESOPHAGOGASTRODUODENOSCOPY (EGD) WITH PROPOFOL N/A 01/18/2016   Procedure: ESOPHAGOGASTRODUODENOSCOPY (EGD) WITH PROPOFOL;  Surgeon: Lucilla Lame, MD;  Location: [facility] ENDOSCOPY;  Service: Endoscopy;  Laterality: N/A;  . FLEXIBLE SIGMOIDOSCOPY N/A 01/18/2016   Procedure: FLEXIBLE SIGMOIDOSCOPY;  Surgeon: Lucilla Lame, MD;  Location: [facility] ENDOSCOPY;  Service: Endoscopy;  Laterality: N/A;  . HERNIA REPAIR      Prior to Admission medications   Medication Sig Start Date End Date Taking? Authorizing Provider  acidophilus (RISAQUAD) CAPS capsule Take 2 capsules by mouth daily. 01/25/16   Bettey Costa, MD  albuterol (PROVENTIL HFA;VENTOLIN HFA) 108 (90 Base) MCG/ACT inhaler Inhale 2 puffs into the lungs every 6 (six) hours as needed for wheezing or shortness of breath. 12/19/15   Gladstone Lighter, MD  apixaban (ELIQUIS) 5 MG TABS tablet Take 1 tablet (5 mg total) by mouth 2 (two) times daily. 04/07/15   Bettey Costa, MD  atorvastatin (LIPITOR) 40 MG tablet Take 40 mg by mouth at bedtime.    Historical Provider, MD  bismuth subsalicylate (PEPTO BISMOL) 262 MG/15ML suspension Take 30 mLs by mouth every 4 (four) hours as needed for diarrhea or loose stools. 01/25/16   Bettey Costa, MD  budesonide-formoterol (SYMBICORT) 160-4.5 MCG/ACT inhaler Inhale 2 puffs into the lungs 2 (two) times daily. 12/19/15   Gladstone Lighter, MD  dicyclomine (BENTYL) 10 MG capsule Take 1 capsule (10 mg total) by mouth 4 (four) times daily -  before meals and at bedtime. 04/09/16   Lytle Butte, MD  diphenhydrAMINE (BENADRYL) 25 mg capsule Take 2 capsules (50 mg total) by mouth every 6 (six) hours as needed. 02/09/16   Carrie Mew, MD  feeding supplement, GLUCERNA SHAKE, (GLUCERNA SHAKE) LIQD Take 237 mLs by mouth 3 (three) times daily between meals. 04/09/16   Lytle Butte, MD    furosemide (LASIX) 20 MG tablet Take 1 tablet (20 mg total) by mouth daily. 02/07/16   Srikar Sudini, MD  insulin aspart (NOVOLOG) 100 UNIT/ML injection Inject into the skin 3 (three) times daily with meals as needed for high blood sugar. Pt uses as needed per sliding scale.    Historical Provider, MD  insulin detemir (LEVEMIR) 100 UNIT/ML injection Inject 0.05 mLs (5 Units total) into the skin at bedtime. 04/09/16   Lytle Butte, MD  levothyroxine (SYNTHROID, LEVOTHROID) 75 MCG tablet Take 1 tablet (75 mcg total) by mouth daily before breakfast. 10/08/15   Loleta Chance, MD  metoCLOPramide (REGLAN) 10 MG tablet Take 1 tablet (10 mg total) by mouth 4 (four) times daily -  before meals and at bedtime. 02/09/16   Carrie Mew, MD  metoprolol tartrate (LOPRESSOR) 25 MG tablet Take 25 mg by mouth 2 (two) times daily.     Historical Provider, MD  morphine (MS CONTIN) 15 MG 12 hr tablet Take 1 tablet (15 mg total) by mouth every 8 (eight) hours. 04/09/16  Lytle Butte, MD  ondansetron (ZOFRAN ODT) 4 MG disintegrating tablet Take 1 tablet (4 mg total) by mouth every 8 (eight) hours as needed for nausea or vomiting. 02/09/16   Carrie Mew, MD  oxyCODONE (OXY IR/ROXICODONE) 5 MG immediate release tablet Take 1-2 tablets (5-10 mg total) by mouth every 6 (six) hours as needed for severe pain. 04/09/16   Lytle Butte, MD  pantoprazole (PROTONIX) 40 MG tablet Take 40 mg by mouth 2 (two) times daily before a meal.    Historical Provider, MD  pantoprazole (PROTONIX) 40 MG tablet Take 1 tablet (40 mg total) by mouth daily. 04/09/16   Lytle Butte, MD  rifaximin (XIFAXAN) 550 MG TABS tablet Take 1 tablet (550 mg total) by mouth 2 (two) times daily. 07/11/15   Theodoro Grist, MD  spironolactone (ALDACTONE) 25 MG tablet Take 1 tablet (25 mg total) by mouth daily. 02/07/16   Srikar Sudini, MD  sucralfate (CARAFATE) 1 GM/10ML suspension Take 10 mLs (1 g total) by mouth 4 (four) times daily -  before meals and at bedtime.  03/16/16   Bettey Costa, MD  tiZANidine (ZANAFLEX) 4 MG tablet Take 4 mg by mouth 3 (three) times daily as needed for muscle spasms.     Historical Provider, MD    Allergies Hydrocodone; Aspirin; Erythromycin; Prednisone; Rosiglitazone maleate; Codeine sulfate; and Tetanus-diphtheria toxoids td  Family History  Problem Relation Age of Onset  . Hypertension Mother   . CAD Sister   . Heart attack Sister     Deceased 11/16/2014  . CAD Brother     Social History Social History  Substance Use Topics  . Smoking status: Never Smoker  . Smokeless tobacco: Never Used  . Alcohol use No    Review of Systems Patient denies headaches, rhinorrhea, blurry vision, numbness, shortness of breath, chest pain, edema, cough, abdominal pain, nausea, vomiting, diarrhea, dysuria, fevers, rashes or hallucinations unless otherwise stated above in HPI. ____________________________________________   PHYSICAL EXAM:  VITAL SIGNS: Vitals:   05/11/16 11/16/00  BP: (!) 168/109  Pulse: (!) 118  Resp: (!) 22    Constitutional: Alert and oriented. Actively vomiting in the ER Eyes: Conjunctivae are normal. PERRL. EOMI. Head: Atraumatic. Nose: No congestion/rhinnorhea. Mouth/Throat: Mucous membranes are moist.  Oropharynx non-erythematous. Neck: No stridor. Painless ROM. No cervical spine tenderness to palpation Hematological/Lymphatic/Immunilogical: No cervical lymphadenopathy. Cardiovascular: Regarding regular rhythm. Grossly normal heart sounds.  Good peripheral circulation. Respiratory: Normal respiratory effort.  No retractions. Lungs CTAB. Gastrointestinal: Soft and nontender. No distention. No abdominal bruits. No CVA tenderness. Genitourinary:  Musculoskeletal: No lower extremity tenderness nor edema.  No joint effusions. Neurologic:  Normal speech and language. No gross focal neurologic deficits are appreciated. No gait instability. Skin:  Skin is warm, dry and intact. No rash noted. Psychiatric: Mood and  affect are normal. Speech and behavior are normal.  ____________________________________________   LABS (all labs ordered are listed, but only abnormal results are displayed)  Results for orders placed or performed during the hospital encounter of 05/11/16 (from the past 24 hour(s))  Lipase, blood     Status: None   Collection Time: 05/11/16  9:08 PM  Result Value Ref Range   Lipase 26 11 - 51 U/L  Comprehensive metabolic panel     Status: Abnormal   Collection Time: 05/11/16  9:08 PM  Result Value Ref Range   Sodium 138 135 - 145 mmol/L   Potassium 4.9 3.5 - 5.1 mmol/L   Chloride 104 101 -  111 mmol/L   CO2 20 (L) 22 - 32 mmol/L   Glucose, Bld 354 (H) 65 - 99 mg/dL   BUN 20 6 - 20 mg/dL   Creatinine, Ser 0.89 0.44 - 1.00 mg/dL   Calcium 9.6 8.9 - 10.3 mg/dL   Total Protein 7.8 6.5 - 8.1 g/dL   Albumin 4.4 3.5 - 5.0 g/dL   AST 76 (H) 15 - 41 U/L   ALT 41 14 - 54 U/L   Alkaline Phosphatase 122 38 - 126 U/L   Total Bilirubin 1.0 0.3 - 1.2 mg/dL   GFR calc non Af Amer >60 >60 mL/min   GFR calc Af Amer >60 >60 mL/min   Anion gap 14 5 - 15  CBC     Status: Abnormal   Collection Time: 05/11/16  9:08 PM  Result Value Ref Range   WBC 6.9 3.6 - 11.0 K/uL   RBC 4.43 3.80 - 5.20 MIL/uL   Hemoglobin 11.2 (L) 12.0 - 16.0 g/dL   HCT 34.3 (L) 35.0 - 47.0 %   MCV 77.4 (L) 80.0 - 100.0 fL   MCH 25.3 (L) 26.0 - 34.0 pg   MCHC 32.6 32.0 - 36.0 g/dL   RDW 18.7 (H) 11.5 - 14.5 %   Platelets 145 (L) 150 - 440 K/uL  Ammonia     Status: None   Collection Time: 05/11/16  9:17 PM  Result Value Ref Range   Ammonia 11 9 - 35 umol/L  Urinalysis complete, with microscopic     Status: Abnormal   Collection Time: 05/11/16 10:51 PM  Result Value Ref Range   Color, Urine YELLOW (A) YELLOW   APPearance CLEAR (A) CLEAR   Glucose, UA >500 (A) NEGATIVE mg/dL   Bilirubin Urine NEGATIVE NEGATIVE   Ketones, ur 1+ (A) NEGATIVE mg/dL   Specific Gravity, Urine 1.025 1.005 - 1.030   Hgb urine dipstick 2+  (A) NEGATIVE   pH 5.0 5.0 - 8.0   Protein, ur 30 (A) NEGATIVE mg/dL   Nitrite NEGATIVE NEGATIVE   Leukocytes, UA NEGATIVE NEGATIVE   RBC / HPF 6-30 0 - 5 RBC/hpf   WBC, UA 0-5 0 - 5 WBC/hpf   Bacteria, UA RARE (A) NONE SEEN   Squamous Epithelial / LPF 0-5 (A) NONE SEEN   Mucous PRESENT    ____________________________________________  EKG My review and personal interpretation at Time: 21"03   Indication: nausea  Rate: 110  Rhythm: nsr Axis: normal Other: nonspecific ST changes, no acute ischemia ____________________________________________  RADIOLOGY  KUB without acute obstructive process ____________________________________________   PROCEDURES  Procedure(s) performed: none    Critical Care performed: no ____________________________________________   INITIAL IMPRESSION / ASSESSMENT AND PLAN / ED COURSE  Pertinent labs & imaging results that were available during my care of the patient were reviewed by me and considered in my medical decision making (see chart for details).  DDX: SBO, gastroparesis, dehydration, medication noncompliance, volvulus, colitis  RAJVI ARMENTOR is a 60 y.o. who presents to the ED with 4 days of intractable nausea vomiting. Patient does have a history of gastroparesis. Also complaining of epigastric pain. Patient states she's had similar episodes in the past was trying to tough it out but has not been able to keep down any medications. Denies any recent fevers. Denies any chest pain or shortness of breath.The patient will be placed on continuous pulse oximetry and telemetry for monitoring.  Laboratory evaluation will be sent to evaluate for the above complaints.  Clinical Course  Patient reassessed remains tachycardic with persistent nausea and vomiting after multiple doses of IV pain medications. I do suspect that this is recurrent gastroparesis. Patient will require admission for symptomatic management.  Have discussed with the patient and  available family all diagnostics and treatments performed thus far and all questions were answered to the best of my ability. The patient demonstrates understanding and agreement with plan.    ____________________________________________   FINAL CLINICAL IMPRESSION(S) / ED DIAGNOSES  Final diagnoses:  Gastroparesis  Intractable vomiting with nausea, vomiting of unspecified type      NEW MEDICATIONS STARTED DURING THIS VISIT:  New Prescriptions   No medications on file     Note:  This document was prepared using Dragon voice recognition software and may include unintentional dictation errors.    Merlyn Lot, MD 05/12/16 [phone]

## 2016-05-11 NOTE — H&P (Signed)
[place] @ [facility] Admission History and Physical [employer], D.O.  ---------------------------------------------------------------------------------------------------------------------   PATIENT NAME: [name] MR#: [id number] DATE OF BIRTH: [birth year] DATE OF ADMISSION: 05/11/2016 PRIMARY CARE PHYSICIAN: No PCP Per Patient  REQUESTING/REFERRING PHYSICIAN: ED Dr. Quentin Cornwall  CHIEF COMPLAINT: Chief Complaint  Patient presents with  . Nausea  . Diarrhea  . Emesis    HISTORY OF PRESENT ILLNESS: [name] is a 60 y.o. female with a known history of Diabetic gastroparesis, nonalcoholic cirrhosis of the liver, paroxysmal atrial fibrillation and sick sinus syndrome was in a usual state of health until 4 days ago when she been experiencing mid epigastric pain associated with intractable vomiting and nausea at home. She states that her last bowel movement was this morning and it was loose. She denied taking narcotic pain medications at home however her medication list reflects to narcotics for chronic pain.  Patient is well known to the hospitalist and the emergency department with multiple visits for similar symptoms, usually when she runs out of her medications. She has recently been admitted for epigastric pain and gastroparesis but has recently been lost to follow-up with primary care and GI.  Otherwise there has been no change in status. Patient has been taking medication as prescribed and there has been no recent change in medication or diet.  There has been no recent illness, travel or sick contacts.    Patient denies fevers/chills, weakness, dizziness, chest pain, shortness of breath, dysuria/frequency, changes in mental status.   EMS/ED COURSE:   Patient received fentanyl, Ativan, Reglan and Zofran.  PAST MEDICAL HISTORY: Past Medical History:  Diagnosis Date  . Allergy   . Anxiety   . Cancer (HCC)    HX OF CANCER OF UTERUS   . Cirrhosis of liver  not due to alcohol ([place]) 2016  . Degenerative disk disease   . Diverticulitis   . Gastroparesis   . GERD (gastroesophageal reflux disease)   . History of hiatal hernia   . Hypertension   . Hypothyroid   . Intussusception intestine ([place]) 05/2015  . PAF (paroxysmal atrial fibrillation) ([place]) 03/2015   a. new onset 03/2015 in setting of intractable N/V; b. on Eliquis 5 mg bid; c. CHADSVASc 4 (DM, TIA x 2, female)  . Pancreatitis   . Sick sinus syndrome ([place])   . Stomach ulcer   . Stroke [facility])    with minimal left sided weakness  . Syncope 01/2015  . TIA (transient ischemic attack) 02/2015  . Type 1 diabetes ([place])    on levemir      PAST SURGICAL HISTORY: Past Surgical History:  Procedure Laterality Date  . ABDOMINAL HYSTERECTOMY    . CARDIAC CATHETERIZATION N/A 01/12/2016   Procedure: Left Heart Cath and Coronary Angiography;  Surgeon: Wellington Hampshire, MD;  Location: [place] CV LAB;  Service: Cardiovascular;  Laterality: N/A;  . CHOLECYSTECTOMY    . ESOPHAGOGASTRODUODENOSCOPY N/A 04/04/2015   Procedure: ESOPHAGOGASTRODUODENOSCOPY (EGD);  Surgeon: Hulen Luster, MD;  Location: [facility] ENDOSCOPY;  Service: Endoscopy;  Laterality: N/A;  . ESOPHAGOGASTRODUODENOSCOPY (EGD) WITH PROPOFOL N/A 01/18/2016   Procedure: ESOPHAGOGASTRODUODENOSCOPY (EGD) WITH PROPOFOL;  Surgeon: Lucilla Lame, MD;  Location: [facility] ENDOSCOPY;  Service: Endoscopy;  Laterality: N/A;  . FLEXIBLE SIGMOIDOSCOPY N/A 01/18/2016   Procedure: FLEXIBLE SIGMOIDOSCOPY;  Surgeon: Lucilla Lame, MD;  Location: [facility] ENDOSCOPY;  Service: Endoscopy;  Laterality: N/A;  . HERNIA REPAIR        SOCIAL HISTORY: Social History  Substance Use Topics  .  Smoking status: Never Smoker  . Smokeless tobacco: Never Used  . Alcohol use No      FAMILY HISTORY: Family History  Problem Relation Age of Onset  . Hypertension Mother   . CAD Sister   . Heart attack Sister     Deceased 2014-11-11  . CAD Brother      MEDICATIONS AT HOME: Prior to  Admission medications   Medication Sig Start Date End Date Taking? Authorizing Provider  acidophilus (RISAQUAD) CAPS capsule Take 2 capsules by mouth daily. 01/25/16  Yes Sital Mody, MD  albuterol (PROVENTIL HFA;VENTOLIN HFA) 108 (90 Base) MCG/ACT inhaler Inhale 2 puffs into the lungs every 6 (six) hours as needed for wheezing or shortness of breath. 12/19/15  Yes Gladstone Lighter, MD  apixaban (ELIQUIS) 5 MG TABS tablet Take 1 tablet (5 mg total) by mouth 2 (two) times daily. 04/07/15  Yes Bettey Costa, MD  atorvastatin (LIPITOR) 40 MG tablet Take 40 mg by mouth at bedtime.   Yes Historical Provider, MD  bismuth subsalicylate (PEPTO BISMOL) 262 MG/15ML suspension Take 30 mLs by mouth every 4 (four) hours as needed for diarrhea or loose stools. 01/25/16  Yes Bettey Costa, MD  budesonide-formoterol (SYMBICORT) 160-4.5 MCG/ACT inhaler Inhale 2 puffs into the lungs 2 (two) times daily. 12/19/15  Yes Gladstone Lighter, MD  dicyclomine (BENTYL) 10 MG capsule Take 1 capsule (10 mg total) by mouth 4 (four) times daily -  before meals and at bedtime. 04/09/16  Yes Lytle Butte, MD  diphenhydrAMINE (BENADRYL) 25 mg capsule Take 2 capsules (50 mg total) by mouth every 6 (six) hours as needed. 02/09/16  Yes Carrie Mew, MD  feeding supplement, GLUCERNA SHAKE, (GLUCERNA SHAKE) LIQD Take 237 mLs by mouth 3 (three) times daily between meals. 04/09/16  Yes Lytle Butte, MD  furosemide (LASIX) 20 MG tablet Take 1 tablet (20 mg total) by mouth daily. 02/07/16  Yes Srikar Sudini, MD  insulin aspart (NOVOLOG) 100 UNIT/ML injection Inject into the skin 3 (three) times daily with meals as needed for high blood sugar. Pt uses as needed per sliding scale.   Yes Historical Provider, MD  insulin detemir (LEVEMIR) 100 UNIT/ML injection Inject 0.05 mLs (5 Units total) into the skin at bedtime. 04/09/16  Yes Lytle Butte, MD  levothyroxine (SYNTHROID, LEVOTHROID) 75 MCG tablet Take 1 tablet (75 mcg total) by mouth daily before  breakfast. 10/08/15  Yes Loleta Chance, MD  metoCLOPramide (REGLAN) 10 MG tablet Take 1 tablet (10 mg total) by mouth 4 (four) times daily -  before meals and at bedtime. 02/09/16  Yes Carrie Mew, MD  metoprolol tartrate (LOPRESSOR) 25 MG tablet Take 25 mg by mouth 2 (two) times daily.    Yes Historical Provider, MD  morphine (MS CONTIN) 15 MG 12 hr tablet Take 1 tablet (15 mg total) by mouth every 8 (eight) hours. 04/09/16  Yes Lytle Butte, MD  ondansetron (ZOFRAN ODT) 4 MG disintegrating tablet Take 1 tablet (4 mg total) by mouth every 8 (eight) hours as needed for nausea or vomiting. 02/09/16  Yes Carrie Mew, MD  oxyCODONE (OXY IR/ROXICODONE) 5 MG immediate release tablet Take 1-2 tablets (5-10 mg total) by mouth every 6 (six) hours as needed for severe pain. 04/09/16  Yes Lytle Butte, MD  pantoprazole (PROTONIX) 40 MG tablet Take 40 mg by mouth 2 (two) times daily before a meal.   Yes Historical Provider, MD  pantoprazole (PROTONIX) 40 MG tablet Take 1 tablet (40 mg total)  by mouth daily. 04/09/16  Yes Lytle Butte, MD  rifaximin (XIFAXAN) 550 MG TABS tablet Take 1 tablet (550 mg total) by mouth 2 (two) times daily. 07/11/15  Yes Theodoro Grist, MD  spironolactone (ALDACTONE) 25 MG tablet Take 1 tablet (25 mg total) by mouth daily. 02/07/16  Yes Srikar Sudini, MD  sucralfate (CARAFATE) 1 GM/10ML suspension Take 10 mLs (1 g total) by mouth 4 (four) times daily -  before meals and at bedtime. 03/16/16  Yes Bettey Costa, MD  tiZANidine (ZANAFLEX) 4 MG tablet Take 4 mg by mouth 3 (three) times daily as needed for muscle spasms.    Yes Historical Provider, MD      DRUG ALLERGIES: Allergies  Allergen Reactions  . Hydrocodone Other (See Comments)    Pt states that this medication caused cirrhosis of the liver.    . Aspirin   . Erythromycin Other (See Comments)    Reaction:  Fever   . Prednisone Other (See Comments)    Reaction:  Unknown   . Rosiglitazone Maleate Swelling  . Codeine Sulfate  Rash  . Tetanus-Diphtheria Toxoids Td Rash and Other (See Comments)    Reaction:  Fever      REVIEW OF SYSTEMS: CONSTITUTIONAL: No fever/chills, weight gain/loss, headache. Positive fatigue and weakness. EYES: No blurry or double vision. ENT: No tinnitus, postnasal drip, redness or soreness of the oropharynx. RESPIRATORY: No cough, wheeze, hemoptysis, dyspnea. CARDIOVASCULAR: No chest pain, orthopnea, palpitations, syncope. GASTROINTESTINAL: Positive nausea, vomiting, constipation, diarrhea, abdominal pain. Negative for hematemesis, melena or hematochezia. GENITOURINARY: No dysuria or hematuria. ENDOCRINE: No polyuria or nocturia. No heat or cold intolerance. HEMATOLOGY: No anemia, bruising, bleeding. INTEGUMENTARY: No rashes, ulcers, lesions. MUSCULOSKELETAL: No arthritis, swelling, gout. NEUROLOGIC: No numbness, tingling, weakness or ataxia. No seizure-type activity. PSYCHIATRIC: No anxiety, depression, insomnia.  PHYSICAL EXAMINATION: VITAL SIGNS: Blood pressure (!) 181/90, pulse (!) 112, resp. rate 16, weight 63.8 kg (140 lb 11.2 oz), SpO2 98 %.  GENERAL: 60 y.o.-year-old white female patient, frail, appears older than stated age lying in the bed in no acute distress.  Pleasant and cooperative.   HEENT: Head atraumatic, normocephalic. Pupils equal, round, reactive to light and accommodation. No scleral icterus. Extraocular muscles intact. Nares are patent. Oropharynx is clear. Mucus membranes moist. NECK: Supple, full range of motion. No JVD, no bruit heard. No thyroid enlargement, no tenderness, no cervical lymphadenopathy. CHEST: Normal breath sounds bilaterally. No wheezing, rales, rhonchi or crackles. No use of accessory muscles of respiration.  No reproducible chest wall tenderness.  CARDIOVASCULAR: S1, S2 normal. No murmurs, rubs, or gallops. Cap refill <2 seconds. ABDOMEN: Soft, nondistended. Diffusely tender worst at  midepigastrium No rebound, guarding, rigidity.  Normoactive bowel sounds present in all four quadrants. No organomegaly or mass. EXTREMITIES: Full range of motion. No pedal edema, cyanosis, or clubbing. NEUROLOGIC: Cranial nerves II through XII are grossly intact with no focal sensorimotor deficit. Muscle strength 5/5 in all extremities. Sensation intact. Gait not checked. PSYCHIATRIC: The patient is alert and oriented x 3. Normal affect, mood, thought content. SKIN: Warm, dry, and intact without obvious rash, lesion, or ulcer.  LABORATORY PANEL:  CBC  Recent Labs Lab 05/11/16 2108  WBC 6.9  HGB 11.2*  HCT 34.3*  PLT 145*   ----------------------------------------------------------------------------------------------------------------- Chemistries  Recent Labs Lab 05/11/16 2108  NA 138  K 4.9  CL 104  CO2 20*  GLUCOSE 354*  BUN 20  CREATININE 0.89  CALCIUM 9.6  AST 76*  ALT 41  ALKPHOS 122  BILITOT 1.0   ------------------------------------------------------------------------------------------------------------------ Cardiac Enzymes No results for input(s): TROPONINI in the last 168 hours. ------------------------------------------------------------------------------------------------------------------  RADIOLOGY: Dg Abd 2 Views  Result Date: 05/11/2016 CLINICAL DATA:  Acute onset of nausea and vomiting. Upper abdominal pain. Initial encounter. EXAM: ABDOMEN - 2 VIEW COMPARISON:  CT of the abdomen and pelvis from 03/13/2016 FINDINGS: The visualized bowel gas pattern is unremarkable. Scattered air and stool filled loops of colon are seen; no abnormal dilatation of small bowel loops is seen to suggest small bowel obstruction. No free intra-abdominal air is identified on the provided upright view. Clips are noted within the right upper quadrant, reflecting prior cholecystectomy. The visualized osseous structures are within normal limits; the sacroiliac joints are unremarkable in appearance. The visualized lung bases are  essentially clear. IMPRESSION: Unremarkable bowel gas pattern; no free intra-abdominal air seen. Electronically Signed   By: Garald Balding M.D.   On: 05/11/2016 21:50    EKG: Pending  IMPRESSION AND PLAN:  This is a 60 y.o. female with a history ofdiabetic gastroparesis, atrial fibrillation nonalcoholic cirrhosis of the liver and sick sinus syndrome now being admitted with:  1. Intractable nausea and vomiting: Secondary to gastroparesis.  -Reglan, Bentyl and Protonix.  -IV fluid hydration  -Check amylase and lipase -Consider CT scan of abdomen and pelvis if symptoms are not improving. 2. Tachycardia, likely multifactorial. Patient has a history of sick sinus syndrome, also is dehydrated and in pain.  -Continue beta blocker  3. Essential hypertension:  Continue metoprolol.  4. Paroxysmal atrial fibrillation, rate controlled -EKG -Continue Eliquis 5. Prolonged QT interval: This is a chronic issue. The patient is on multiple QT prolonging agents as an outpatient. 6. Diabetes mellitus: Unclear if type I or 2 but we will continue the patient's basal insulin and place on sliding scale. 7. COPD: Continue inhaled corticosteroid as well as albuterol as needed. 8. Hypothyroidism: Continue Synthroid 9. Hyperlipidemia: Continue statin therapy 10. Cirrhosis of liver: Continue rifaximin as well as Lasix and spironolactone per home regimen 11. DVT prophylaxis:  patient is on Eliquis  Diet/Nutrition: clear liquids and advance as tolerated Fluids:  IV normal saline DVT Px: Eliquis SCDs and early ambulation Code Status: Full. This was confirmed with the patient today.   All the records are reviewed and case discussed with ED provider. Management plans discussed with the patient and/or family who express understanding and agree with plan of care.   TOTAL TIME TAKING CARE OF THIS PATIENT: 50 minutes.   Vonita Calloway D.O. on 05/11/2016 at 11:53 PM Between 7am to 6pm - Pager -  [phone] After 6pm go to [email or web address] - [employer] [facility] Office [phone] CC: Primary care physician; No PCP Per Patient     Note: This dictation was prepared with Dragon dictation along with smaller phrase technology. Any transcriptional errors that result from this process are unintentional.

## 2016-05-11 NOTE — ED Triage Notes (Signed)
Per EMS patient called out co n/v/d for the last 4 days.  Patient presents to ER actively dry heaving with EMS unable to get any IV access PTA.  Pt has hx of afib, HTN.  Pt co 10/10 shooting pain in upper abdominal area.

## 2016-05-12 ENCOUNTER — Encounter: Payer: Self-pay | Admitting: *Deleted

## 2016-05-12 LAB — CBC
HCT: 33.7 % — ABNORMAL LOW (ref 35.0–47.0)
Hemoglobin: 11 g/dL — ABNORMAL LOW (ref 12.0–16.0)
MCH: 25.4 pg — AB (ref 26.0–34.0)
MCHC: 32.5 g/dL (ref 32.0–36.0)
MCV: 78 fL — AB (ref 80.0–100.0)
PLATELETS: 120 10*3/uL — AB (ref 150–440)
RBC: 4.32 MIL/uL (ref 3.80–5.20)
RDW: 18.8 % — AB (ref 11.5–14.5)
WBC: 6.7 10*3/uL (ref 3.6–11.0)

## 2016-05-12 LAB — PHOSPHORUS: PHOSPHORUS: 3.8 mg/dL (ref 2.5–4.6)

## 2016-05-12 LAB — GLUCOSE, CAPILLARY
GLUCOSE-CAPILLARY: 285 mg/dL — AB (ref 65–99)
GLUCOSE-CAPILLARY: 210 mg/dL — AB (ref 65–99)
GLUCOSE-CAPILLARY: 179 mg/dL — AB (ref 65–99)
Glucose-Capillary: 375 mg/dL — ABNORMAL HIGH (ref 65–99)
Glucose-Capillary: 394 mg/dL — ABNORMAL HIGH (ref 65–99)
Glucose-Capillary: 245 mg/dL — ABNORMAL HIGH (ref 65–99)
Glucose-Capillary: 243 mg/dL — ABNORMAL HIGH (ref 65–99)
Glucose-Capillary: 239 mg/dL — ABNORMAL HIGH (ref 65–99)

## 2016-05-12 LAB — BASIC METABOLIC PANEL
Anion gap: 9 (ref 5–15)
BUN: 20 mg/dL (ref 6–20)
CALCIUM: 9.3 mg/dL (ref 8.9–10.3)
CHLORIDE: 107 mmol/L (ref 101–111)
CO2: 22 mmol/L (ref 22–32)
CREATININE: 0.79 mg/dL (ref 0.44–1.00)
GFR calc non Af Amer: >60 mL/min (ref >60)
GLUCOSE: 351 mg/dL — AB (ref 65–99)
Potassium: 3.2 mmol/L — ABNORMAL LOW (ref 3.5–5.1)
Sodium: 138 mmol/L (ref 135–145)

## 2016-05-12 LAB — MAGNESIUM: Magnesium: 1.6 mg/dL — ABNORMAL LOW (ref 1.7–2.4)

## 2016-05-12 MED ORDER — DIPHENHYDRAMINE HCL 25 MG PO CAPS: 50.0000 mg | ORAL_CAPSULE | Freq: Four times a day (QID) | ORAL | Status: DC | PRN

## 2016-05-12 MED ORDER — DICYCLOMINE HCL 10 MG PO CAPS
10.0000 mg | ORAL_CAPSULE | Freq: Three times a day (TID) | ORAL | Status: DC
  Administered 2016-05-12 – 2016-05-16 (×17): 10 mg via ORAL
  Filled 2016-05-12 (×19): qty 1

## 2016-05-12 MED ORDER — PANTOPRAZOLE SODIUM 40 MG PO TBEC
40.0000 mg | DELAYED_RELEASE_TABLET | Freq: Every day | ORAL | Status: DC
Start: 2016-05-12 — End: 2016-05-16
  Administered 2016-05-12 – 2016-05-16 (×5): 40 mg via ORAL
  Filled 2016-05-12 (×5): qty 1

## 2016-05-12 MED ORDER — RISAQUAD PO CAPS
2.0000 | ORAL_CAPSULE | Freq: Every day | ORAL | Status: DC
  Administered 2016-05-12 – 2016-05-16 (×5): 2 via ORAL
  Filled 2016-05-12 (×5): qty 2

## 2016-05-12 MED ORDER — HYDRALAZINE HCL 20 MG/ML IJ SOLN
10.0000 mg | INTRAMUSCULAR | Status: DC | PRN
  Administered 2016-05-12 (×2): 10 mg via INTRAVENOUS
  Filled 2016-05-12 (×2): qty 1

## 2016-05-12 MED ORDER — METOCLOPRAMIDE HCL 5 MG/ML IJ SOLN: 5.0000 mg | Freq: Four times a day (QID) | INTRAMUSCULAR | Status: DC | PRN

## 2016-05-12 MED ORDER — FUROSEMIDE 20 MG PO TABS
20.0000 mg | ORAL_TABLET | Freq: Every day | ORAL | Status: DC
  Administered 2016-05-12 – 2016-05-15 (×4): 20 mg via ORAL
  Filled 2016-05-12 (×4): qty 1

## 2016-05-12 MED ORDER — ATORVASTATIN CALCIUM 20 MG PO TABS
40.0000 mg | ORAL_TABLET | Freq: Every day | ORAL | Status: DC
  Administered 2016-05-12 – 2016-05-15 (×4): 40 mg via ORAL
  Filled 2016-05-12 (×4): qty 2

## 2016-05-12 MED ORDER — APIXABAN 5 MG PO TABS
5.0000 mg | ORAL_TABLET | Freq: Two times a day (BID) | ORAL | Status: DC
  Administered 2016-05-12 – 2016-05-16 (×9): 5 mg via ORAL
  Filled 2016-05-12 (×9): qty 1

## 2016-05-12 MED ORDER — TIZANIDINE HCL 4 MG PO TABS
4.0000 mg | ORAL_TABLET | Freq: Three times a day (TID) | ORAL | Status: DC | PRN
  Administered 2016-05-15 (×2): 4 mg via ORAL
  Filled 2016-05-12 (×2): qty 1

## 2016-05-12 MED ORDER — MORPHINE SULFATE (PF) 2 MG/ML IV SOLN
1.0000 mg | INTRAVENOUS | Status: DC | PRN
  Administered 2016-05-12 – 2016-05-14 (×14): 1 mg via INTRAVENOUS
  Filled 2016-05-12 (×14): qty 1

## 2016-05-12 MED ORDER — ALBUTEROL SULFATE (2.5 MG/3ML) 0.083% IN NEBU: 3.0000 mL | INHALATION_SOLUTION | Freq: Four times a day (QID) | RESPIRATORY_TRACT | Status: DC | PRN

## 2016-05-12 MED ORDER — LEVOTHYROXINE SODIUM 50 MCG PO TABS
75.0000 ug | ORAL_TABLET | Freq: Every day | ORAL | Status: DC
Start: 2016-05-12 — End: 2016-05-16
  Administered 2016-05-12 – 2016-05-16 (×5): 75 ug via ORAL
  Filled 2016-05-12: qty 1
  Filled 2016-05-12 (×2): qty 2
  Filled 2016-05-12: qty 1
  Filled 2016-05-12 (×2): qty 2

## 2016-05-12 MED ORDER — METOPROLOL TARTRATE 25 MG PO TABS
25.0000 mg | ORAL_TABLET | Freq: Two times a day (BID) | ORAL | Status: DC
  Administered 2016-05-12 – 2016-05-13 (×4): 25 mg via ORAL
  Filled 2016-05-12 (×4): qty 1

## 2016-05-12 MED ORDER — SODIUM CHLORIDE 0.9 % IV SOLN
INTRAVENOUS | Status: DC
  Administered 2016-05-12 – 2016-05-13 (×4): via INTRAVENOUS

## 2016-05-12 MED ORDER — SPIRONOLACTONE 25 MG PO TABS
25.0000 mg | ORAL_TABLET | Freq: Every day | ORAL | Status: DC
  Administered 2016-05-12 – 2016-05-16 (×5): 25 mg via ORAL
  Filled 2016-05-12 (×5): qty 1

## 2016-05-12 MED ORDER — METOCLOPRAMIDE HCL 10 MG PO TABS
10.0000 mg | ORAL_TABLET | Freq: Three times a day (TID) | ORAL | Status: DC
  Administered 2016-05-12: 08:00:00 10 mg via ORAL
  Filled 2016-05-12: qty 1

## 2016-05-12 MED ORDER — POTASSIUM CHLORIDE 20 MEQ PO PACK
40.0000 meq | PACK | Freq: Once | ORAL | Status: DC
  Filled 2016-05-12: qty 2

## 2016-05-12 MED ORDER — BISMUTH SUBSALICYLATE 262 MG/15ML PO SUSP
30.0000 mL | ORAL | Status: DC | PRN
  Filled 2016-05-12: qty 118

## 2016-05-12 MED ORDER — GLUCERNA SHAKE PO LIQD
237.0000 mL | Freq: Three times a day (TID) | ORAL | Status: DC
  Administered 2016-05-12 – 2016-05-15 (×11): 237 mL via ORAL

## 2016-05-12 MED ORDER — METOCLOPRAMIDE HCL 5 MG/ML IJ SOLN
10.0000 mg | Freq: Four times a day (QID) | INTRAMUSCULAR | Status: DC
  Administered 2016-05-12 – 2016-05-13 (×3): 10 mg via INTRAVENOUS
  Filled 2016-05-12 (×3): qty 2

## 2016-05-12 MED ORDER — INSULIN ASPART 100 UNIT/ML ~~LOC~~ SOLN
0.0000 [IU] | SUBCUTANEOUS | Status: DC
  Administered 2016-05-12: 3 [IU] via SUBCUTANEOUS
  Administered 2016-05-12: 02:00:00 9 [IU] via SUBCUTANEOUS
  Administered 2016-05-12: 2 [IU] via SUBCUTANEOUS
  Administered 2016-05-12: 9 [IU] via SUBCUTANEOUS
  Administered 2016-05-12: 21:00:00 3 [IU] via SUBCUTANEOUS
  Administered 2016-05-12: 5 [IU] via SUBCUTANEOUS
  Administered 2016-05-12 – 2016-05-13 (×2): 3 [IU] via SUBCUTANEOUS
  Administered 2016-05-13: 04:00:00 2 [IU] via SUBCUTANEOUS
  Administered 2016-05-13: 13:00:00 3 [IU] via SUBCUTANEOUS
  Administered 2016-05-13: 21:00:00 5 [IU] via SUBCUTANEOUS
  Administered 2016-05-13: 08:00:00 2 [IU] via SUBCUTANEOUS
  Administered 2016-05-14: 3 [IU] via SUBCUTANEOUS
  Administered 2016-05-14 (×2): 1 [IU] via SUBCUTANEOUS
  Administered 2016-05-14: 2 [IU] via SUBCUTANEOUS
  Administered 2016-05-14 (×2): 3 [IU] via SUBCUTANEOUS
  Administered 2016-05-15: 17:00:00 5 [IU] via SUBCUTANEOUS
  Administered 2016-05-15: 2 [IU] via SUBCUTANEOUS
  Administered 2016-05-15 (×4): 3 [IU] via SUBCUTANEOUS
  Administered 2016-05-16: 1 [IU] via SUBCUTANEOUS
  Administered 2016-05-16: 01:00:00 2 [IU] via SUBCUTANEOUS
  Administered 2016-05-16: 1 [IU] via SUBCUTANEOUS
  Filled 2016-05-12: qty 3
  Filled 2016-05-12 (×2): qty 2
  Filled 2016-05-12 (×2): qty 3
  Filled 2016-05-12: qty 1
  Filled 2016-05-12 (×2): qty 3
  Filled 2016-05-12: qty 2
  Filled 2016-05-12: qty 1
  Filled 2016-05-12: qty 9
  Filled 2016-05-12: qty 3
  Filled 2016-05-12: qty 2
  Filled 2016-05-12: qty 9
  Filled 2016-05-12: qty 3
  Filled 2016-05-12 (×2): qty 2
  Filled 2016-05-12: qty 3
  Filled 2016-05-12: qty 1
  Filled 2016-05-12 (×2): qty 5
  Filled 2016-05-12 (×2): qty 3
  Filled 2016-05-12: qty 5
  Filled 2016-05-12 (×2): qty 3
  Filled 2016-05-12: qty 1

## 2016-05-12 MED ORDER — SUCRALFATE 1 GM/10ML PO SUSP
1.0000 g | Freq: Three times a day (TID) | ORAL | Status: DC
Start: 2016-05-12 — End: 2016-05-16
  Administered 2016-05-12 – 2016-05-16 (×17): 1 g via ORAL
  Filled 2016-05-12 (×20): qty 10

## 2016-05-12 MED ORDER — CIPROFLOXACIN HCL 0.3 % OP SOLN
2.0000 [drp] | OPHTHALMIC | Status: DC
  Administered 2016-05-12 – 2016-05-16 (×31): 2 [drp] via OPHTHALMIC
  Filled 2016-05-12: qty 2.5

## 2016-05-12 MED ORDER — INSULIN DETEMIR 100 UNIT/ML ~~LOC~~ SOLN
5.0000 [IU] | Freq: Every day | SUBCUTANEOUS | Status: DC
  Administered 2016-05-12 – 2016-05-15 (×5): 5 [IU] via SUBCUTANEOUS
  Filled 2016-05-12 (×6): qty 0.05

## 2016-05-12 MED ORDER — ACETAMINOPHEN 650 MG RE SUPP: 650.0000 mg | Freq: Four times a day (QID) | RECTAL | Status: DC | PRN

## 2016-05-12 MED ORDER — POTASSIUM CHLORIDE CRYS ER 20 MEQ PO TBCR
40.0000 meq | EXTENDED_RELEASE_TABLET | Freq: Once | ORAL | Status: AC
Start: 2016-05-12 — End: 2016-05-12
  Administered 2016-05-12: 40 meq via ORAL
  Filled 2016-05-12: qty 2

## 2016-05-12 MED ORDER — ZOLPIDEM TARTRATE 5 MG PO TABS: 5.0000 mg | ORAL_TABLET | Freq: Every evening | ORAL | Status: DC | PRN

## 2016-05-12 MED ORDER — RIFAXIMIN 550 MG PO TABS
550.0000 mg | ORAL_TABLET | Freq: Two times a day (BID) | ORAL | Status: DC
Start: 2016-05-12 — End: 2016-05-16
  Administered 2016-05-12 – 2016-05-16 (×9): 550 mg via ORAL
  Filled 2016-05-12 (×9): qty 1

## 2016-05-12 MED ORDER — ACETAMINOPHEN 325 MG PO TABS: 650.0000 mg | ORAL_TABLET | Freq: Four times a day (QID) | ORAL | Status: DC | PRN

## 2016-05-12 NOTE — Progress Notes (Signed)
[place] at [facility]                                                                                                                                                                                            Patient Demographics   [name], is a 60 y.o. female, DOB - Nov 07, 1955, IHK:[id number]  Admit date - 05/11/2016   Admitting Physician Harvie Bridge, DO  Outpatient Primary MD for the patient is No PCP Per Patient   LOS - 1  Subjective: Patient has had multiple admissions for same was just in the hospital on the 28th with nausea vomiting abdominal pain Continues to complain of nausea vomiting and abdominal pain    Review of Systems:   CONSTITUTIONAL: No documented fever. No fatigue, weakness. No weight gain, no weight loss.  EYES: No blurry or double vision.  ENT: No tinnitus. No postnasal drip. No redness of the oropharynx.  RESPIRATORY: No cough, no wheeze, no hemoptysis. No dyspnea.  CARDIOVASCULAR: No chest pain. No orthopnea. No palpitations. No syncope.  GASTROINTESTINAL: Positive nausea, positive vomiting or diarrhea. Positive abdominal pain. No melena or hematochezia.  GENITOURINARY: No dysuria or hematuria.  ENDOCRINE: No polyuria or nocturia. No heat or cold intolerance.  HEMATOLOGY: No anemia. No bruising. No bleeding.  INTEGUMENTARY: No rashes. No lesions.  MUSCULOSKELETAL: No arthritis. No swelling. No gout.  NEUROLOGIC: No numbness, tingling, or ataxia. No seizure-type activity.  PSYCHIATRIC: No anxiety. No insomnia. No ADD.    Vitals:   Vitals:   05/12/16 0800 05/12/16 0813 05/12/16 0905 05/12/16 1036  BP:  (!) 173/99 (!) 161/82 (!) 149/85  Pulse: (!) 128 (!) 127 (!) 120 91  Resp:      Temp:      TempSrc:      SpO2:  98%    Weight:      Height:        Wt Readings from Last 3 Encounters:  05/12/16 128 lb 12.8 oz (58.4 kg)  04/27/16 132 lb (59.9 kg)  04/09/16 139 lb 14.4 oz (63.5 kg)      Intake/Output Summary (Last 24 hours) at 05/12/16 1139 Last data filed at 05/12/16 0920  Gross per 24 hour  Intake             57.5 ml  Output             1050 ml  Net           -992.5 ml    Physical Exam:   GENERAL: Pleasant-appearing in no apparent distress.  HEAD, EYES, EARS, NOSE AND THROAT: Atraumatic, normocephalic. Extraocular muscles  are intact. Pupils equal and reactive to light. Sclerae anicteric. No conjunctival injection. No oro-pharyngeal erythema. Right sided conjunctival erythema NECK: Supple. There is no jugular venous distention. No bruits, no lymphadenopathy, no thyromegaly.  HEART: Regular rate and rhythm,. No murmurs, no rubs, no clicks.  LUNGS: Clear to auscultation bilaterally. No rales or rhonchi. No wheezes.  ABDOMEN: Soft, flat mild epigastric tenderness, nondistended. Has good bowel sounds. No hepatosplenomegaly appreciated.  EXTREMITIES: No evidence of any cyanosis, clubbing, or peripheral edema.  +2 pedal and radial pulses bilaterally.  NEUROLOGIC: The patient is alert, awake, and oriented x3 with no focal motor or sensory deficits appreciated bilaterally.  SKIN: Moist and warm with no rashes appreciated.  Psych: Not anxious, depressed LN: No inguinal LN enlargement    Antibiotics   Anti-infectives    Start     Dose/Rate Route Frequency Ordered Stop   05/12/16 0300  rifaximin (XIFAXAN) tablet 550 mg     550 mg Oral 2 times daily 05/12/16 0113        Medications   Scheduled Meds: . acidophilus  2 capsule Oral Daily  . apixaban  5 mg Oral BID  . atorvastatin  40 mg Oral QHS  . ciprofloxacin  2 drop Right Eye Q2H while awake  . dicyclomine  10 mg Oral TID AC & HS  . feeding supplement (GLUCERNA SHAKE)  237 mL Oral TID BM  . furosemide  20 mg Oral Daily  . insulin aspart  0-9 Units Subcutaneous Q4H  . insulin detemir  5 Units Subcutaneous QHS  . levothyroxine  75 mcg Oral QAC breakfast  . metoCLOPramide (REGLAN) injection  10 mg Intravenous  Q6H  . metoprolol tartrate  25 mg Oral BID  . pantoprazole  40 mg Oral QAC breakfast  . potassium chloride  40 mEq Oral Once  . rifaximin  550 mg Oral BID  . spironolactone  25 mg Oral Daily  . sucralfate  1 g Oral TID AC & HS   Continuous Infusions: . sodium chloride 75 mL/hr at 05/12/16 0413   PRN Meds:.acetaminophen **OR** acetaminophen, albuterol, bismuth subsalicylate, diphenhydrAMINE, hydrALAZINE, morphine injection, ondansetron (ZOFRAN) IV, tiZANidine, zolpidem   Data Review:   Micro Results No results found for this or any previous visit (from the past 240 hour(s)).  Radiology Reports Dg Abd 2 Views  Result Date: 05/11/2016 CLINICAL DATA:  Acute onset of nausea and vomiting. Upper abdominal pain. Initial encounter. EXAM: ABDOMEN - 2 VIEW COMPARISON:  CT of the abdomen and pelvis from 03/13/2016 FINDINGS: The visualized bowel gas pattern is unremarkable. Scattered air and stool filled loops of colon are seen; no abnormal dilatation of small bowel loops is seen to suggest small bowel obstruction. No free intra-abdominal air is identified on the provided upright view. Clips are noted within the right upper quadrant, reflecting prior cholecystectomy. The visualized osseous structures are within normal limits; the sacroiliac joints are unremarkable in appearance. The visualized lung bases are essentially clear. IMPRESSION: Unremarkable bowel gas pattern; no free intra-abdominal air seen. Electronically Signed   By: Garald Balding M.D.   On: 05/11/2016 21:50     CBC  Recent Labs Lab 05/11/16 2108 05/12/16 0444  WBC 6.9 6.7  HGB 11.2* 11.0*  HCT 34.3* 33.7*  PLT 145* 120*  MCV 77.4* 78.0*  MCH 25.3* 25.4*  MCHC 32.6 32.5  RDW 18.7* 18.8*    Chemistries   Recent Labs Lab 05/11/16 2108 05/12/16 0444  NA 138 138  K 4.9 3.2*  CL  104 107  CO2 20* 22  GLUCOSE 354* 351*  BUN 20 20  CREATININE 0.89 0.79  CALCIUM 9.6 9.3  MG 1.6*  --   AST 76*  --   ALT 41  --    ALKPHOS 122  --   BILITOT 1.0  --    ------------------------------------------------------------------------------------------------------------------ estimated creatinine clearance is 60.3 mL/min (by C-G formula based on SCr of 0.8 mg/dL). ------------------------------------------------------------------------------------------------------------------ No results for input(s): HGBA1C in the last 72 hours. ------------------------------------------------------------------------------------------------------------------ No results for input(s): CHOL, HDL, LDLCALC, TRIG, CHOLHDL, LDLDIRECT in the last 72 hours. ------------------------------------------------------------------------------------------------------------------ No results for input(s): TSH, T4TOTAL, T3FREE, THYROIDAB in the last 72 hours.  Invalid input(s): FREET3 ------------------------------------------------------------------------------------------------------------------ No results for input(s): VITAMINB12, FOLATE, FERRITIN, TIBC, IRON, RETICCTPCT in the last 72 hours.  Coagulation profile No results for input(s): INR, PROTIME in the last 168 hours.  No results for input(s): DDIMER in the last 72 hours.  Cardiac Enzymes No results for input(s): CKMB, TROPONINI, MYOGLOBIN in the last 168 hours.  Invalid input(s): CK ------------------------------------------------------------------------------------------------------------------ Invalid input(s): [place]   This is a 60 y.o. female with a history ofdiabetic gastroparesis, atrial fibrillation nonalcoholic cirrhosis of the liver and sick sinus syndrome now being admitted with:  1. Intractable nausea and vomiting: Secondary to gastroparesis.  Recurrent in nature I will place her on IV Reglan scheduled to see if they'll help with her symptoms Continue pain control I do not see any need for imaging at this point  2. Tachycardia, likely  multifactorial. Suspect due to anxiety and pain  -Continue beta blocker heart rate improved 3. Essential hypertension:  Continue metoprolol.  4. Paroxysmal atrial fibrillation, rate controlled -Continue Eliquis 5. Prolonged QT interval: This is a chronic issue. Continue to monitor on telemetry  6. Diabetes mellitus 2 continue sliding scale and levemir 7.COPD: Continue inhaled corticosteroid as well as albuterol as needed. 8. Hypothyroidism: Continue Synthroid 9. Hyperlipidemia: Continue statin therapy 10. Cirrhosis of liver: Continue rifaximinas well as Lasix and spironolactone per home regimen 11. DVT prophylaxis:  patient is on Eliquis     Code Status Orders        Start     Ordered   05/12/16 0114  Full code  Continuous     05/12/16 0113    Code Status History    Date Active Date Inactive Code Status Order ID Comments User Context   04/07/2016  6:18 AM 04/09/2016  2:19 PM DNR [id number]  Harrie Foreman, MD Inpatient   03/13/2016  9:25 PM 03/16/2016  6:32 PM DNR [id number]  Lance Coon, MD Inpatient   02/04/2016  9:39 AM 02/07/2016  2:59 PM DNR [id number]  Saundra Shelling, MD Inpatient   01/21/2016 10:24 PM 01/25/2016  5:02 PM DNR [id number]  Lance Coon, MD Inpatient   01/15/2016  1:39 PM 01/19/2016  3:29 PM DNR [id number]  Gladstone Lighter, MD ED   01/10/2016 10:00 PM 01/13/2016  5:24 PM DNR [id number]  Harrie Foreman, MD Inpatient   12/27/2015 11:02 PM 01/01/2016  6:17 PM DNR [id number]  Max Sane, MD Inpatient   12/16/2015  4:54 PM 12/19/2015  4:52 PM DNR [id number]  Loletha Grayer, MD ED   11/11/2015 11:36 AM 11/16/2015  2:05 PM Full Code [id number]  Samella Parr, NP Inpatient   10/15/2015  1:23 AM 10/16/2015  6:17 PM Full Code [id number]  Lance Coon, MD Inpatient   10/04/2015  8:19 PM 10/08/2015  6:34 PM Full Code [id number]  Bethena Roys, MD  Inpatient   08/15/2015  1:05 AM 08/16/2015  8:04 PM Full Code [id number]  Harrie Foreman, MD Inpatient   08/01/2015  1:25 AM 08/06/2015  5:41 PM Full  Code [id number]  Harrie Foreman, MD Inpatient   07/04/2015  2:53 AM 07/11/2015  6:33 PM Full Code [id number]  Lytle Butte, MD ED   05/27/2015 10:48 PM 05/31/2015  2:37 PM Full Code [id number]  Rise Patience, MD Inpatient   05/19/2015  1:14 AM 05/22/2015  4:28 PM Full Code [id number]  Rise Patience, MD Inpatient   04/05/2015  9:16 AM 04/07/2015  5:23 PM Full Code [id number]  Bettey Costa, MD Inpatient   04/03/2015 12:31 PM 04/05/2015  9:16 AM Full Code [id number]  Bettey Costa, MD Inpatient   03/11/2015  4:49 PM 03/14/2015  3:50 PM Full Code [id number]  Dustin Flock, MD Inpatient   02/15/2015  4:51 PM 02/17/2015  2:33 PM DNR [id number]  Dustin Flock, MD Inpatient   02/15/2015  4:35 PM 02/15/2015  4:51 PM Full Code [id number]  Dustin Flock, MD Inpatient   01/11/2015  2:40 AM 01/12/2015  6:11 PM Full Code [id number]  Etta Quill, DO ED    Advance Directive Documentation   Flowsheet Row Most Recent Value  Type of Advance Directive  Healthcare Power of Attorney, Living will  Pre-existing out of facility DNR order (yellow form or pink MOST form)  No data  "MOST" Form in Place?  No data           Consults  noen   DVT Prophylaxis eliquis  Lab Results  Component Value Date   PLT 120 (L) 05/12/2016     Time Spent in minutes   10mn      PDustin FlockM.D on 05/12/2016 at 11:39 AM  Between 7am to 6pm - Pager - [phone]  After 6pm go to [email or web address] - password [email or web address]   Office  [phone]

## 2016-05-13 LAB — GLUCOSE, CAPILLARY
GLUCOSE-CAPILLARY: 158 mg/dL — AB (ref 65–99)
GLUCOSE-CAPILLARY: 234 mg/dL — AB (ref 65–99)
GLUCOSE-CAPILLARY: 204 mg/dL — AB (ref 65–99)
Glucose-Capillary: 171 mg/dL — ABNORMAL HIGH (ref 65–99)
Glucose-Capillary: 260 mg/dL — ABNORMAL HIGH (ref 65–99)

## 2016-05-13 LAB — BASIC METABOLIC PANEL
ANION GAP: 4 — AB (ref 5–15)
BUN: 21 mg/dL — AB (ref 6–20)
CALCIUM: 8.4 mg/dL — AB (ref 8.9–10.3)
CO2: 26 mmol/L (ref 22–32)
Chloride: 108 mmol/L (ref 101–111)
Creatinine, Ser: 0.68 mg/dL (ref 0.44–1.00)
GFR calc Af Amer: >60 mL/min (ref >60)
GFR calc non Af Amer: >60 mL/min (ref >60)
GLUCOSE: 170 mg/dL — AB (ref 65–99)
Potassium: 3.8 mmol/L (ref 3.5–5.1)
Sodium: 138 mmol/L (ref 135–145)

## 2016-05-13 MED ORDER — METOCLOPRAMIDE HCL 10 MG PO TABS
10.0000 mg | ORAL_TABLET | Freq: Three times a day (TID) | ORAL | Status: DC
  Administered 2016-05-13 – 2016-05-16 (×12): 10 mg via ORAL
  Filled 2016-05-13 (×12): qty 1

## 2016-05-13 MED ORDER — METOPROLOL TARTRATE 50 MG PO TABS
50.0000 mg | ORAL_TABLET | Freq: Two times a day (BID) | ORAL | Status: DC
  Administered 2016-05-13 – 2016-05-15 (×4): 50 mg via ORAL
  Filled 2016-05-13 (×4): qty 1

## 2016-05-13 NOTE — Progress Notes (Signed)
[place] at [facility]                                                                                                                                                                                            Patient Demographics   [name], is a 59 y.o. female, DOB - 01-24-1956, ERD:[id number]  Admit date - 05/11/2016   Admitting Physician Harvie Bridge, DO  Outpatient Primary MD for the patient is No PCP Per Patient   LOS - 2  Subjective: Continues to complain of nausea and abdominal pain. Denies any chest pain or shortness of breath    Review of Systems:   CONSTITUTIONAL: No documented fever. No fatigue, weakness. No weight gain, no weight loss.  EYES: No blurry or double vision.  ENT: No tinnitus. No postnasal drip. No redness of the oropharynx.  RESPIRATORY: No cough, no wheeze, no hemoptysis. No dyspnea.  CARDIOVASCULAR: No chest pain. No orthopnea. No palpitations. No syncope.  GASTROINTESTINAL: Positive nausea, positive vomiting No diarrhea. Positive abdominal pain. No melena or hematochezia.  GENITOURINARY: No dysuria or hematuria.  ENDOCRINE: No polyuria or nocturia. No heat or cold intolerance.  HEMATOLOGY: No anemia. No bruising. No bleeding.  INTEGUMENTARY: No rashes. No lesions.  MUSCULOSKELETAL: No arthritis. No swelling. No gout.  NEUROLOGIC: No numbness, tingling, or ataxia. No seizure-type activity.  PSYCHIATRIC: No anxiety. No insomnia. No ADD.    Vitals:   Vitals:   05/12/16 1509 05/12/16 2003 05/13/16 0508 05/13/16 0830  BP: (!) 158/80 (!) 144/76 (!) 145/74 (!) 152/91  Pulse: 91 (!) 102 100 (!) 124  Resp: 20 20 18    Temp: 98.6 F (37 C) 98.2 F (36.8 C) 98.1 F (36.7 C) 97.9 F (36.6 C)  TempSrc:  Oral Oral Oral  SpO2: 100% 97% 98% 99%  Weight:      Height:        Wt Readings from Last 3 Encounters:  05/12/16 128 lb 12.8 oz (58.4 kg)  04/27/16 132 lb (59.9 kg)  04/09/16 139 lb 14.4 oz (63.5 kg)      Intake/Output Summary (Last 24 hours) at 05/13/16 0958 Last data filed at 05/13/16 5631  Gross per 24 hour  Intake          1301.25 ml  Output             1100 ml  Net           201.25 ml    Physical Exam:   GENERAL: Pleasant-appearing in no apparent distress.  HEAD, EYES, EARS, NOSE AND THROAT: Atraumatic, normocephalic. Extraocular muscles are intact. Pupils equal  and reactive to light. Sclerae anicteric. No conjunctival injection. No oro-pharyngeal erythema. Right sided conjunctival erythema NECK: Supple. There is no jugular venous distention. No bruits, no lymphadenopathy, no thyromegaly.  HEART: Regular rate and rhythm,. No murmurs, no rubs, no clicks.  LUNGS: Clear to auscultation bilaterally. No rales or rhonchi. No wheezes.  ABDOMEN: Soft, flat mild epigastric tenderness, nondistended. Has good bowel sounds. No hepatosplenomegaly appreciated.  EXTREMITIES: No evidence of any cyanosis, clubbing, or peripheral edema.  +2 pedal and radial pulses bilaterally.  NEUROLOGIC: The patient is alert, awake, and oriented x3 with no focal motor or sensory deficits appreciated bilaterally.  SKIN: Moist and warm with no rashes appreciated.  Psych: Not anxious, depressed LN: No inguinal LN enlargement    Antibiotics   Anti-infectives    Start     Dose/Rate Route Frequency Ordered Stop   05/12/16 0300  rifaximin (XIFAXAN) tablet 550 mg     550 mg Oral 2 times daily 05/12/16 0113        Medications   Scheduled Meds: . acidophilus  2 capsule Oral Daily  . apixaban  5 mg Oral BID  . atorvastatin  40 mg Oral QHS  . ciprofloxacin  2 drop Right Eye Q2H while awake  . dicyclomine  10 mg Oral TID AC & HS  . feeding supplement (GLUCERNA SHAKE)  237 mL Oral TID BM  . furosemide  20 mg Oral Daily  . insulin aspart  0-9 Units Subcutaneous Q4H  . insulin detemir  5 Units Subcutaneous QHS  . levothyroxine  75 mcg Oral QAC breakfast  . metoCLOPramide (REGLAN) injection  10 mg Intravenous  Q6H  . metoprolol tartrate  25 mg Oral BID  . pantoprazole  40 mg Oral QAC breakfast  . rifaximin  550 mg Oral BID  . spironolactone  25 mg Oral Daily  . sucralfate  1 g Oral TID AC & HS   Continuous Infusions: . sodium chloride 75 mL/hr at 05/13/16 0832   PRN Meds:.acetaminophen **OR** acetaminophen, albuterol, bismuth subsalicylate, diphenhydrAMINE, hydrALAZINE, morphine injection, ondansetron (ZOFRAN) IV, tiZANidine, zolpidem   Data Review:   Micro Results No results found for this or any previous visit (from the past 240 hour(s)).  Radiology Reports Dg Abd 2 Views  Result Date: 05/11/2016 CLINICAL DATA:  Acute onset of nausea and vomiting. Upper abdominal pain. Initial encounter. EXAM: ABDOMEN - 2 VIEW COMPARISON:  CT of the abdomen and pelvis from 03/13/2016 FINDINGS: The visualized bowel gas pattern is unremarkable. Scattered air and stool filled loops of colon are seen; no abnormal dilatation of small bowel loops is seen to suggest small bowel obstruction. No free intra-abdominal air is identified on the provided upright view. Clips are noted within the right upper quadrant, reflecting prior cholecystectomy. The visualized osseous structures are within normal limits; the sacroiliac joints are unremarkable in appearance. The visualized lung bases are essentially clear. IMPRESSION: Unremarkable bowel gas pattern; no free intra-abdominal air seen. Electronically Signed   By: Garald Balding M.D.   On: 05/11/2016 21:50     CBC  Recent Labs Lab 05/11/16 2108 05/12/16 0444  WBC 6.9 6.7  HGB 11.2* 11.0*  HCT 34.3* 33.7*  PLT 145* 120*  MCV 77.4* 78.0*  MCH 25.3* 25.4*  MCHC 32.6 32.5  RDW 18.7* 18.8*    Chemistries   Recent Labs Lab 05/11/16 2108 05/12/16 0444 05/13/16 0403  NA 138 138 138  K 4.9 3.2* 3.8  CL 104 107 108  CO2 20* 22 26  GLUCOSE 354* 351* 170*  BUN 20 20 21*  CREATININE 0.89 0.79 0.68  CALCIUM 9.6 9.3 8.4*  MG 1.6*  --   --   AST 76*  --   --    ALT 41  --   --   ALKPHOS 122  --   --   BILITOT 1.0  --   --    ------------------------------------------------------------------------------------------------------------------ estimated creatinine clearance is 60.3 mL/min (by C-G formula based on SCr of 0.8 mg/dL). ------------------------------------------------------------------------------------------------------------------ No results for input(s): HGBA1C in the last 72 hours. ------------------------------------------------------------------------------------------------------------------ No results for input(s): CHOL, HDL, LDLCALC, TRIG, CHOLHDL, LDLDIRECT in the last 72 hours. ------------------------------------------------------------------------------------------------------------------ No results for input(s): TSH, T4TOTAL, T3FREE, THYROIDAB in the last 72 hours.  Invalid input(s): FREET3 ------------------------------------------------------------------------------------------------------------------ No results for input(s): VITAMINB12, FOLATE, FERRITIN, TIBC, IRON, RETICCTPCT in the last 72 hours.  Coagulation profile No results for input(s): INR, PROTIME in the last 168 hours.  No results for input(s): DDIMER in the last 72 hours.  Cardiac Enzymes No results for input(s): CKMB, TROPONINI, MYOGLOBIN in the last 168 hours.  Invalid input(s): CK ------------------------------------------------------------------------------------------------------------------ Invalid input(s): [place]   This is a 60 y.o. female with a history ofdiabetic gastroparesis, atrial fibrillation nonalcoholic cirrhosis of the liver and sick sinus syndrome now being admitted with:  1. Intractable nausea and vomiting: Secondary to gastroparesis.  Recurrent in nature Change Reglan back to oral  2. Tachycardia, likely multifactorial. Suspect due to anxiety and pain  -Continue beta blocker heart rate improved 3.  Essential hypertension:  Heart rate continues to be elevated I'll increase her metoprolol 4. Paroxysmal atrial fibrillation, increasing metoprolol -Continue Eliquis 5. Prolonged QT interval: This is a chronic issue. Continue to monitor on telemetry  6. Diabetes mellitus 2 continue sliding scale and levemir 7.COPD: Continue inhaled corticosteroid as well as albuterol as needed. 8. Hypothyroidism: Continue Synthroid 9. Hyperlipidemia: Continue statin therapy 10. Cirrhosis of liver: Continue rifaximinas well as Lasix and spironolactone per home regimen 11. DVT prophylaxis:  patient is on Eliquis     Code Status Orders        Start     Ordered   05/12/16 0114  Full code  Continuous     05/12/16 0113    Code Status History    Date Active Date Inactive Code Status Order ID Comments User Context   04/07/2016  6:18 AM 04/09/2016  2:19 PM DNR [id number]  Harrie Foreman, MD Inpatient   03/13/2016  9:25 PM 03/16/2016  6:32 PM DNR [id number]  Lance Coon, MD Inpatient   02/04/2016  9:39 AM 02/07/2016  2:59 PM DNR [id number]  Saundra Shelling, MD Inpatient   01/21/2016 10:24 PM 01/25/2016  5:02 PM DNR [id number]  Lance Coon, MD Inpatient   01/15/2016  1:39 PM 01/19/2016  3:29 PM DNR [id number]  Gladstone Lighter, MD ED   01/10/2016 10:00 PM 01/13/2016  5:24 PM DNR [id number]  Harrie Foreman, MD Inpatient   12/27/2015 11:02 PM 01/01/2016  6:17 PM DNR [id number]  Max Sane, MD Inpatient   12/16/2015  4:54 PM 12/19/2015  4:52 PM DNR [id number]  Loletha Grayer, MD ED   11/11/2015 11:36 AM 11/16/2015  2:05 PM Full Code [id number]  Samella Parr, NP Inpatient   10/15/2015  1:23 AM 10/16/2015  6:17 PM Full Code [id number]  Lance Coon, MD Inpatient   10/04/2015  8:19 PM 10/08/2015  6:34 PM Full Code [id number]  Bethena Roys, MD Inpatient   08/15/2015  1:05  AM 08/16/2015  8:04 PM Full Code [id number]  Harrie Foreman, MD Inpatient   08/01/2015  1:25 AM 08/06/2015  5:41 PM Full Code [id number]  Harrie Foreman, MD Inpatient    07/04/2015  2:53 AM 07/11/2015  6:33 PM Full Code [id number]  Lytle Butte, MD ED   05/27/2015 10:48 PM 05/31/2015  2:37 PM Full Code [id number]  Rise Patience, MD Inpatient   05/19/2015  1:14 AM 05/22/2015  4:28 PM Full Code [id number]  Rise Patience, MD Inpatient   04/05/2015  9:16 AM 04/07/2015  5:23 PM Full Code [id number]  Bettey Costa, MD Inpatient   04/03/2015 12:31 PM 04/05/2015  9:16 AM Full Code [id number]  Bettey Costa, MD Inpatient   03/11/2015  4:49 PM 03/14/2015  3:50 PM Full Code [id number]  Dustin Flock, MD Inpatient   02/15/2015  4:51 PM 02/17/2015  2:33 PM DNR [id number]  Dustin Flock, MD Inpatient   02/15/2015  4:35 PM 02/15/2015  4:51 PM Full Code [id number]  Dustin Flock, MD Inpatient   01/11/2015  2:40 AM 01/12/2015  6:11 PM Full Code [id number]  Etta Quill, DO ED    Advance Directive Documentation   Flowsheet Row Most Recent Value  Type of Advance Directive  Healthcare Power of Attorney, Living will  Pre-existing out of facility DNR order (yellow form or pink MOST form)  No data  "MOST" Form in Place?  No data           Consults  noen   DVT Prophylaxis eliquis  Lab Results  Component Value Date   PLT 120 (L) 05/12/2016     Time Spent in minutes   10mn      Akane Tessier M.D on 05/13/2016 at 9:58 AM  Between 7am to 6pm - Pager - [phone]  After 6pm go to [email or web address] - password [email or web address]   Office  [phone]

## 2016-05-13 NOTE — Plan of Care (Signed)
Problem: Pain Managment: Goal: General experience of comfort will improve Outcome: Not Progressing freq pain med requirement continues

## 2016-05-14 LAB — GLUCOSE, CAPILLARY
GLUCOSE-CAPILLARY: 124 mg/dL — AB (ref 65–99)
GLUCOSE-CAPILLARY: 146 mg/dL — AB (ref 65–99)
GLUCOSE-CAPILLARY: 159 mg/dL — AB (ref 65–99)
GLUCOSE-CAPILLARY: 214 mg/dL — AB (ref 65–99)
Glucose-Capillary: 230 mg/dL — ABNORMAL HIGH (ref 65–99)
Glucose-Capillary: 256 mg/dL — ABNORMAL HIGH (ref 65–99)

## 2016-05-14 MED ORDER — OXYCODONE HCL 5 MG PO TABS
5.0000 mg | ORAL_TABLET | ORAL | Status: DC | PRN
  Administered 2016-05-14 – 2016-05-16 (×9): 5 mg via ORAL
  Filled 2016-05-14 (×9): qty 1

## 2016-05-14 NOTE — Plan of Care (Signed)
Problem: Safety: Goal: Ability to remain free from injury will improve Outcome: Progressing Pt asked to  Get oob  By self. Explained to  Pt  The  Need to continue to call for assistance oob  D/t  Increased pain med she is receiving. Pt  Said  She understood  Problem: Pain Managment: Goal: General experience of comfort will improve Outcome: Progressing Switched to po pain med today. Increased diet. Pt tol well  Problem: Fluid Volume: Goal: Ability to maintain a balanced intake and output will improve Outcome: Progressing ivfs d/cd today

## 2016-05-14 NOTE — Care Management (Signed)
Admitted to [facility] with the diagnosis of Gastroparesis. Lives with husband, [name] [phone]). Last primary care physician seen was Dr. Kary Kos in January 2017. States she hasn't had the time to call any physicians. Gave [name] list of physicians accepting new patients in [place]. Ms. Outland says she lives in [place], but would be willing to travel to [place]. Discussed that [facility] and [facility] are accepting. Encouraged Ms Weingartner to make these calls prior to being discharged from this hospital. No home oxygen. [facility] and [place] in the past. No skilled facility. No home oxygen. Self feeds, self dress, husband helps with baths. Last fall was 2 weeks ago. Decreased appetite. Lost 10-15 pounds in 3 weeks. Rolling walker, cane, shower chair, and ramps in the home. Husband will transport. Shelbie Ammons RN MSN CCM Care Management [phone]

## 2016-05-14 NOTE — Plan of Care (Signed)
Problem: Pain Managment: Goal: General experience of comfort will improve Outcome: Not Progressing Pt still receiving 1 mg of Morphine every 4 hours prn. Pt request pain med at every opportunity for abdominal pain.

## 2016-05-14 NOTE — Care Management Important Message (Signed)
Important Message  Patient Details  Name: DENIM START MRN: [id number] Date of Birth: 1955-10-20   Medicare Important Message Given:  Yes    Shelbie Ammons, RN 05/14/2016, 9:02 AM

## 2016-05-14 NOTE — Progress Notes (Signed)
[facility]                                                                                                                                                                                            Patient Demographics   [name], is a 60 y.o. female, DOB - 09-14-1955, MCN:[id number]  Admit date - 05/11/2016   Admitting Physician Harvie Bridge, DO  Outpatient Primary MD for the patient is No PCP Per Patient   LOS - 3  Subjective: According to the nurse she is tolerated 100% are clear liquid diet continues to ask for IV pain meds I recommended patient to be discharged reports that she is not eaten solid food    Review of Systems:   CONSTITUTIONAL: No documented fever. No fatigue, weakness. No weight gain, no weight loss.  EYES: No blurry or double vision.  ENT: No tinnitus. No postnasal drip. No redness of the oropharynx.  RESPIRATORY: No cough, no wheeze, no hemoptysis. No dyspnea.  CARDIOVASCULAR: No chest pain. No orthopnea. No palpitations. No syncope.  GASTROINTESTINAL: Positive nausea, positive vomiting No diarrhea. Positive abdominal pain. No melena or hematochezia.  GENITOURINARY: No dysuria or hematuria.  ENDOCRINE: No polyuria or nocturia. No heat or cold intolerance.  HEMATOLOGY: No anemia. No bruising. No bleeding.  INTEGUMENTARY: No rashes. No lesions.  MUSCULOSKELETAL: No arthritis. No swelling. No gout.  NEUROLOGIC: No numbness, tingling, or ataxia. No seizure-type activity.  PSYCHIATRIC: No anxiety. No insomnia. No ADD.    Vitals:   Vitals:   05/13/16 1417 05/13/16 2046 05/14/16 0426 05/14/16 0818  BP: (!) 146/75 (!) 144/79 132/67 (!) 146/66  Pulse: (!) 110 (!) 114 80 87  Resp:    20  Temp: 98.4 F (36.9 C) 98.1 F (36.7 C) 98.4 F (36.9 C) 98.2 F (36.8 C)  TempSrc: Oral Oral Oral Oral  SpO2: 100% 99% 98% 99%  Weight:      Height:        Wt Readings from Last 3 Encounters:  05/12/16 128 lb  12.8 oz (58.4 kg)  04/27/16 132 lb (59.9 kg)  04/09/16 139 lb 14.4 oz (63.5 kg)     Intake/Output Summary (Last 24 hours) at 05/14/16 1127 Last data filed at 05/14/16 1006  Gross per 24 hour  Intake             2617 ml  Output             2300 ml  Net              317 ml    Physical Exam:  GENERAL: Pleasant-appearing in no apparent distress.  HEAD, EYES, EARS, NOSE AND THROAT: Atraumatic, normocephalic. Extraocular muscles are intact. Pupils equal and reactive to light. Sclerae anicteric. No conjunctival injection. No oro-pharyngeal erythema. Right sided conjunctival erythema NECK: Supple. There is no jugular venous distention. No bruits, no lymphadenopathy, no thyromegaly.  HEART: Regular rate and rhythm,. No murmurs, no rubs, no clicks.  LUNGS: Clear to auscultation bilaterally. No rales or rhonchi. No wheezes.  ABDOMEN: Soft, flat mild epigastric tenderness, nondistended. Has good bowel sounds. No hepatosplenomegaly appreciated.  EXTREMITIES: No evidence of any cyanosis, clubbing, or peripheral edema.  +2 pedal and radial pulses bilaterally.  NEUROLOGIC: The patient is alert, awake, and oriented x3 with no focal motor or sensory deficits appreciated bilaterally.  SKIN: Moist and warm with no rashes appreciated.  Psych: Not anxious, depressed LN: No inguinal LN enlargement    Antibiotics   Anti-infectives    Start     Dose/Rate Route Frequency Ordered Stop   05/12/16 0300  rifaximin (XIFAXAN) tablet 550 mg     550 mg Oral 2 times daily 05/12/16 0113        Medications   Scheduled Meds: . acidophilus  2 capsule Oral Daily  . apixaban  5 mg Oral BID  . atorvastatin  40 mg Oral QHS  . ciprofloxacin  2 drop Right Eye Q2H while awake  . dicyclomine  10 mg Oral TID AC & HS  . feeding supplement (GLUCERNA SHAKE)  237 mL Oral TID BM  . furosemide  20 mg Oral Daily  . insulin aspart  0-9 Units Subcutaneous Q4H  . insulin detemir  5 Units Subcutaneous QHS  . levothyroxine   75 mcg Oral QAC breakfast  . metoCLOPramide  10 mg Oral TID AC & HS  . metoprolol tartrate  50 mg Oral BID  . pantoprazole  40 mg Oral QAC breakfast  . rifaximin  550 mg Oral BID  . spironolactone  25 mg Oral Daily  . sucralfate  1 g Oral TID AC & HS   Continuous Infusions: . sodium chloride 75 mL/hr at 05/13/16 2228   PRN Meds:.acetaminophen **OR** acetaminophen, albuterol, bismuth subsalicylate, diphenhydrAMINE, hydrALAZINE, oxyCODONE, tiZANidine, zolpidem   Data Review:   Micro Results No results found for this or any previous visit (from the past 240 hour(s)).  Radiology Reports Dg Abd 2 Views  Result Date: 05/11/2016 CLINICAL DATA:  Acute onset of nausea and vomiting. Upper abdominal pain. Initial encounter. EXAM: ABDOMEN - 2 VIEW COMPARISON:  CT of the abdomen and pelvis from 03/13/2016 FINDINGS: The visualized bowel gas pattern is unremarkable. Scattered air and stool filled loops of colon are seen; no abnormal dilatation of small bowel loops is seen to suggest small bowel obstruction. No free intra-abdominal air is identified on the provided upright view. Clips are noted within the right upper quadrant, reflecting prior cholecystectomy. The visualized osseous structures are within normal limits; the sacroiliac joints are unremarkable in appearance. The visualized lung bases are essentially clear. IMPRESSION: Unremarkable bowel gas pattern; no free intra-abdominal air seen. Electronically Signed   By: Garald Balding M.D.   On: 05/11/2016 21:50     CBC  Recent Labs Lab 05/11/16 2108 05/12/16 0444  WBC 6.9 6.7  HGB 11.2* 11.0*  HCT 34.3* 33.7*  PLT 145* 120*  MCV 77.4* 78.0*  MCH 25.3* 25.4*  MCHC 32.6 32.5  RDW 18.7* 18.8*    Chemistries   Recent Labs Lab 05/11/16 2108 05/12/16 0444 05/13/16 0403  NA 138  138 138  K 4.9 3.2* 3.8  CL 104 107 108  CO2 20* 22 26  GLUCOSE 354* 351* 170*  BUN 20 20 21*  CREATININE 0.89 0.79 0.68  CALCIUM 9.6 9.3 8.4*  MG 1.6*   --   --   AST 76*  --   --   ALT 41  --   --   ALKPHOS 122  --   --   BILITOT 1.0  --   --    ------------------------------------------------------------------------------------------------------------------ estimated creatinine clearance is 60.3 mL/min (by C-G formula based on SCr of 0.8 mg/dL). ------------------------------------------------------------------------------------------------------------------ No results for input(s): HGBA1C in the last 72 hours. ------------------------------------------------------------------------------------------------------------------ No results for input(s): CHOL, HDL, LDLCALC, TRIG, CHOLHDL, LDLDIRECT in the last 72 hours. ------------------------------------------------------------------------------------------------------------------ No results for input(s): TSH, T4TOTAL, T3FREE, THYROIDAB in the last 72 hours.  Invalid input(s): FREET3 ------------------------------------------------------------------------------------------------------------------ No results for input(s): VITAMINB12, FOLATE, FERRITIN, TIBC, IRON, RETICCTPCT in the last 72 hours.  Coagulation profile No results for input(s): INR, PROTIME in the last 168 hours.  No results for input(s): DDIMER in the last 72 hours.  Cardiac Enzymes No results for input(s): CKMB, TROPONINI, MYOGLOBIN in the last 168 hours.  Invalid input(s): CK ------------------------------------------------------------------------------------------------------------------ Invalid input(s): [place]   This is a 60 y.o. female with a history ofdiabetic gastroparesis, atrial fibrillation nonalcoholic cirrhosis of the liver and sick sinus syndrome now being admitted with:  1. Intractable nausea and vomiting: Secondary to gastroparesis.  Recurrent in nature Continue Reglan and antiemetics I explained to the patient that the treatment for gastroparesis is not IV pain meds I will  change to oral medications advance her diet and hopefully discharge tomorrow  2. Tachycardia, likely multifactorial. Suspect due to anxiety and pain  -Continue beta blocker heart rate improved 3. Essential hypertension:  Continue metoprolol 4. Paroxysmal atrial fibrillation,continue metoprolol -Continue Eliquis 5. Prolonged QT interval: This is a chronic issue. Continue to monitor on telemetry  6. Diabetes mellitus 2 continue sliding scale and levemir 7.COPD: Continue inhaled corticosteroid as well as albuterol as needed. 8. Hypothyroidism: Continue Synthroid 9. Hyperlipidemia: Continue statin therapy 10. Cirrhosis of liver: Continue rifaximinas well as Lasix and spironolactone per home regimen 11. DVT prophylaxis:  patient is on Eliquis     Code Status Orders        Start     Ordered   05/12/16 0114  Full code  Continuous     05/12/16 0113    Code Status History    Date Active Date Inactive Code Status Order ID Comments User Context   04/07/2016  6:18 AM 04/09/2016  2:19 PM DNR [id number]  Harrie Foreman, MD Inpatient   03/13/2016  9:25 PM 03/16/2016  6:32 PM DNR [id number]  Lance Coon, MD Inpatient   02/04/2016  9:39 AM 02/07/2016  2:59 PM DNR [id number]  Saundra Shelling, MD Inpatient   01/21/2016 10:24 PM 01/25/2016  5:02 PM DNR [id number]  Lance Coon, MD Inpatient   01/15/2016  1:39 PM 01/19/2016  3:29 PM DNR [id number]  Gladstone Lighter, MD ED   01/10/2016 10:00 PM 01/13/2016  5:24 PM DNR [id number]  Harrie Foreman, MD Inpatient   12/27/2015 11:02 PM 01/01/2016  6:17 PM DNR [id number]  Max Sane, MD Inpatient   12/16/2015  4:54 PM 12/19/2015  4:52 PM DNR [id number]  Loletha Grayer, MD ED   11/11/2015 11:36 AM 11/16/2015  2:05 PM Full Code [id number]  Samella Parr, NP Inpatient   10/15/2015  1:23 AM  10/16/2015  6:17 PM Full Code [id number]  Lance Coon, MD Inpatient   10/04/2015  8:19 PM 10/08/2015  6:34 PM Full Code [id number]  Bethena Roys, MD Inpatient   08/15/2015  1:05 AM 08/16/2015  8:04  PM Full Code [id number]  Harrie Foreman, MD Inpatient   08/01/2015  1:25 AM 08/06/2015  5:41 PM Full Code [id number]  Harrie Foreman, MD Inpatient   07/04/2015  2:53 AM 07/11/2015  6:33 PM Full Code [id number]  Lytle Butte, MD ED   05/27/2015 10:48 PM 05/31/2015  2:37 PM Full Code [id number]  Rise Patience, MD Inpatient   05/19/2015  1:14 AM 05/22/2015  4:28 PM Full Code [id number]  Rise Patience, MD Inpatient   04/05/2015  9:16 AM 04/07/2015  5:23 PM Full Code [id number]  Bettey Costa, MD Inpatient   04/03/2015 12:31 PM 04/05/2015  9:16 AM Full Code [id number]  Bettey Costa, MD Inpatient   03/11/2015  4:49 PM 03/14/2015  3:50 PM Full Code [id number]  Dustin Flock, MD Inpatient   02/15/2015  4:51 PM 02/17/2015  2:33 PM DNR [id number]  Dustin Flock, MD Inpatient   02/15/2015  4:35 PM 02/15/2015  4:51 PM Full Code [id number]  Dustin Flock, MD Inpatient   01/11/2015  2:40 AM 01/12/2015  6:11 PM Full Code [id number]  Etta Quill, DO ED    Advance Directive Documentation   Flowsheet Row Most Recent Value  Type of Advance Directive  Healthcare Power of Attorney, Living will  Pre-existing out of facility DNR order (yellow form or pink MOST form)  No data  "MOST" Form in Place?  No data           Consults  noen   DVT Prophylaxis eliquis  Lab Results  Component Value Date   PLT 120 (L) 05/12/2016     Time Spent in minutes   88mn      Jaedin Trumbo M.D on 05/14/2016 at 11:27 AM  Between 7am to 6pm - Pager - [phone]  After 6pm go to [email or web address] - password [email or web address]   Office  [phone]

## 2016-05-15 LAB — GLUCOSE, CAPILLARY
GLUCOSE-CAPILLARY: 183 mg/dL — AB (ref 65–99)
GLUCOSE-CAPILLARY: 197 mg/dL — AB (ref 65–99)
GLUCOSE-CAPILLARY: 201 mg/dL — AB (ref 65–99)
GLUCOSE-CAPILLARY: 213 mg/dL — AB (ref 65–99)
GLUCOSE-CAPILLARY: 264 mg/dL — AB (ref 65–99)
Glucose-Capillary: 227 mg/dL — ABNORMAL HIGH (ref 65–99)
Glucose-Capillary: 240 mg/dL — ABNORMAL HIGH (ref 65–99)
Glucose-Capillary: 177 mg/dL — ABNORMAL HIGH (ref 65–99)

## 2016-05-15 LAB — CBC
HEMATOCRIT: 28.1 % — AB (ref 35.0–47.0)
HEMOGLOBIN: 9.3 g/dL — AB (ref 12.0–16.0)
MCH: 25.8 pg — ABNORMAL LOW (ref 26.0–34.0)
MCHC: 33.2 g/dL (ref 32.0–36.0)
MCV: 77.7 fL — AB (ref 80.0–100.0)
Platelets: 93 10*3/uL — ABNORMAL LOW (ref 150–440)
RBC: 3.62 MIL/uL — ABNORMAL LOW (ref 3.80–5.20)
RDW: 18.5 % — ABNORMAL HIGH (ref 11.5–14.5)
WBC: 5 10*3/uL (ref 3.6–11.0)

## 2016-05-15 LAB — CREATININE, SERUM
Creatinine, Ser: 0.67 mg/dL (ref 0.44–1.00)
GFR calc non Af Amer: >60 mL/min (ref >60)

## 2016-05-15 MED ORDER — GLUCERNA SHAKE PO LIQD: 237.0000 mL | Freq: Three times a day (TID) | ORAL | 0 refills | Status: DC

## 2016-05-15 MED ORDER — DICYCLOMINE HCL 10 MG PO CAPS: 10.0000 mg | ORAL_CAPSULE | Freq: Three times a day (TID) | ORAL | 0 refills | Status: DC

## 2016-05-15 MED ORDER — MORPHINE SULFATE ER 15 MG PO TBCR: 15.0000 mg | EXTENDED_RELEASE_TABLET | Freq: Three times a day (TID) | ORAL | 0 refills | Status: DC

## 2016-05-15 MED ORDER — FUROSEMIDE 20 MG PO TABS: 20.0000 mg | ORAL_TABLET | Freq: Every day | ORAL | 0 refills | Status: DC

## 2016-05-15 MED ORDER — SPIRONOLACTONE 25 MG PO TABS: 25.0000 mg | ORAL_TABLET | Freq: Every day | ORAL | 0 refills | Status: DC

## 2016-05-15 MED ORDER — OXYCODONE HCL 5 MG PO TABS: 5.0000 mg | ORAL_TABLET | Freq: Four times a day (QID) | ORAL | 0 refills | Status: DC | PRN

## 2016-05-15 MED ORDER — SODIUM CHLORIDE 0.9 % IV BOLUS (SEPSIS)
1000.0000 mL | Freq: Once | INTRAVENOUS | Status: AC
  Administered 2016-05-15: 15:00:00 1000 mL via INTRAVENOUS

## 2016-05-15 MED ORDER — ONDANSETRON 4 MG PO TBDP: 4.0000 mg | ORAL_TABLET | Freq: Three times a day (TID) | ORAL | 0 refills | Status: DC | PRN

## 2016-05-15 MED ORDER — INSULIN ASPART 100 UNIT/ML ~~LOC~~ SOLN: 5.0000 [IU] | Freq: Three times a day (TID) | SUBCUTANEOUS | 11 refills | Status: DC | PRN

## 2016-05-15 MED ORDER — PANTOPRAZOLE SODIUM 40 MG PO TBEC: 40.0000 mg | DELAYED_RELEASE_TABLET | Freq: Two times a day (BID) | ORAL | 0 refills | Status: DC

## 2016-05-15 MED ORDER — APIXABAN 5 MG PO TABS: 5.0000 mg | ORAL_TABLET | Freq: Two times a day (BID) | ORAL | 0 refills | Status: DC

## 2016-05-15 MED ORDER — METOPROLOL TARTRATE 25 MG PO TABS: 25.0000 mg | ORAL_TABLET | Freq: Two times a day (BID) | ORAL | 0 refills | Status: DC

## 2016-05-15 MED ORDER — BUDESONIDE-FORMOTEROL FUMARATE 160-4.5 MCG/ACT IN AERO: 2.0000 | INHALATION_SPRAY | Freq: Two times a day (BID) | RESPIRATORY_TRACT | 1 refills | Status: DC

## 2016-05-15 MED ORDER — TIZANIDINE HCL 4 MG PO TABS: 4.0000 mg | ORAL_TABLET | Freq: Three times a day (TID) | ORAL | 0 refills | Status: DC | PRN

## 2016-05-15 MED ORDER — SODIUM CHLORIDE 0.9 % IV SOLN
INTRAVENOUS | Status: DC
  Administered 2016-05-15 – 2016-05-16 (×3): via INTRAVENOUS

## 2016-05-15 MED ORDER — ATORVASTATIN CALCIUM 40 MG PO TABS: 40.0000 mg | ORAL_TABLET | Freq: Every day | ORAL | 0 refills | Status: DC

## 2016-05-15 MED ORDER — INSULIN DETEMIR 100 UNIT/ML ~~LOC~~ SOLN: 5.0000 [IU] | Freq: Every day | SUBCUTANEOUS | 11 refills | Status: DC

## 2016-05-15 MED ORDER — HYDROMORPHONE HCL 1 MG/ML IJ SOLN
1.0000 mg | Freq: Once | INTRAMUSCULAR | Status: AC
  Administered 2016-05-15: 1 mg via INTRAVENOUS
  Filled 2016-05-15: qty 1

## 2016-05-15 MED ORDER — RIFAXIMIN 550 MG PO TABS: 550.0000 mg | ORAL_TABLET | Freq: Two times a day (BID) | ORAL | 1 refills | Status: DC

## 2016-05-15 MED ORDER — LEVOTHYROXINE SODIUM 75 MCG PO TABS: 75.0000 ug | ORAL_TABLET | Freq: Every day | ORAL | 1 refills | Status: DC

## 2016-05-15 MED ORDER — METOCLOPRAMIDE HCL 10 MG PO TABS: 10.0000 mg | ORAL_TABLET | Freq: Three times a day (TID) | ORAL | 0 refills | Status: DC

## 2016-05-15 MED ORDER — ALBUTEROL SULFATE HFA 108 (90 BASE) MCG/ACT IN AERS: 2.0000 | INHALATION_SPRAY | Freq: Four times a day (QID) | RESPIRATORY_TRACT | 2 refills | Status: DC | PRN

## 2016-05-15 MED ORDER — SUCRALFATE 1 GM/10ML PO SUSP: 1.0000 g | Freq: Three times a day (TID) | ORAL | 0 refills | Status: DC

## 2016-05-15 NOTE — Progress Notes (Signed)
Reported to Dr Vianne Bulls that patient blood pressure is 98/48. MD ordered normal saline at 60 ml/hr, discontinue metoprolol and lasix.

## 2016-05-15 NOTE — Plan of Care (Signed)
Problem: Pain Managment: Goal: General experience of comfort will improve Outcome: Progressing Changed to po pain medication (oxycodone).

## 2016-05-15 NOTE — Discharge Summary (Signed)
[name], is a 60 y.o. female  DOB September 29, 1955  MRN [id number].  Admission date:  05/11/2016  Admitting Physician  Harvie Bridge, DO  Discharge Date:  05/15/2016   Primary MD  No PCP Per Patient  Recommendations for primary care physician for things to follow:   advised The patient to make appointments with primary doctors, social worker informed her about [facility],  [facility] accepting new patients. Advised patient to make appointments.   Admission Diagnosis  Gastroparesis [K31.84] Vomiting [R11.10] Intractable vomiting with nausea, vomiting of unspecified type [R11.10]   Discharge Diagnosis  Gastroparesis [K31.84] Vomiting [R11.10] Intractable vomiting with nausea, vomiting of unspecified type [R11.10]    Active Problems:   Gastroparesis      Past Medical History:  Diagnosis Date  . Allergy   . Anxiety   . Cancer (HCC)    HX OF CANCER OF UTERUS   . Cirrhosis of liver not due to alcohol ([place]) 2016  . Degenerative disk disease   . Diverticulitis   . Gastroparesis   . GERD (gastroesophageal reflux disease)   . History of hiatal hernia   . Hypertension   . Hypothyroid   . Intussusception intestine ([place]) 05/2015  . PAF (paroxysmal atrial fibrillation) ([place]) 03/2015   a. new onset 03/2015 in setting of intractable N/V; b. on Eliquis 5 mg bid; c. CHADSVASc 4 (DM, TIA x 2, female)  . Pancreatitis   . Sick sinus syndrome ([place])   . Stomach ulcer   . Stroke [facility])    with minimal left sided weakness  . Syncope 01/2015  . TIA (transient ischemic attack) 02/2015  . Type 1 diabetes ([place])    on levemir    Past Surgical History:  Procedure Laterality Date  . ABDOMINAL HYSTERECTOMY    . CARDIAC CATHETERIZATION N/A 01/12/2016   Procedure: Left Heart Cath and Coronary Angiography;   Surgeon: Wellington Hampshire, MD;  Location: [facility];  Service: Cardiovascular;  Laterality: N/A;  . CHOLECYSTECTOMY    . ESOPHAGOGASTRODUODENOSCOPY N/A 04/04/2015   Procedure: ESOPHAGOGASTRODUODENOSCOPY (EGD);  Surgeon: Hulen Luster, MD;  Location: [facility] ENDOSCOPY;  Service: Endoscopy;  Laterality: N/A;  . ESOPHAGOGASTRODUODENOSCOPY (EGD) WITH PROPOFOL N/A 01/18/2016   Procedure: ESOPHAGOGASTRODUODENOSCOPY (EGD) WITH PROPOFOL;  Surgeon: Lucilla Lame, MD;  Location: [facility] ENDOSCOPY;  Service: Endoscopy;  Laterality: N/A;  . FLEXIBLE SIGMOIDOSCOPY N/A 01/18/2016   Procedure: FLEXIBLE SIGMOIDOSCOPY;  Surgeon: Lucilla Lame, MD;  Location: [facility] ENDOSCOPY;  Service: Endoscopy;  Laterality: N/A;  . HERNIA REPAIR         History of present illness and  Hospital Course:     Kindly see H&P for history of present illness and admission details, please review complete Labs, Consult reports and Test reports for all details in brief  HPI  from the history and physical done on the day of admission Admitted on September 1 because of epigastric pain with intractable vomiting, nausea at home. Patient well-known to hospitalist service for multiple admissions because of epigastric pain, gastroparesis, medication noncompliance.   Hospital Course    #1 intractable nausea, vomiting secondary to diabetic gastroparesis: Admitted to hospitalist service, started on IV fluids, IV nausea medications. Also started on Reglan, CAT scan of abdomen, pelvis obtained. It showed cirrhosis of the liver, some thickening of the ascending colon due to ascites, acute colitis. Patient improved with conservative treatment with IV fluids, nausea medications. Did not have any fever, elevated white count. Her kidney function remained  stable. Patient is tolerating regular food today. And she is eager to go home. Patient told me that she is not taking medicines for the last 2 months. And we'll give prescription for a month supply. Advised  her to make appointment with primary doctors.  #2. Diabetes mellitus type 2: The patient is on Levemir, SSI with Coverage. #3 history of liver cirrhosis. Patient is on Lasix, Aldactone. Patient advised to continue them. #4 chronic pain syndrome, patient is on OxyContin, oxycodone.  Gave prescription for 20 tablets of each only. #5 proximal atrial fibrillation: Patient is on  Eliquis,Bblocker #6 essential hypertension: Controlled. #7 GERD continue PPI, sucralfate. #8 . 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 #9 history of COPD: No wheezing. Continue home inhalers. Discharge Condition: stable   Follow UP'      Discharge Instructions  and  Discharge Medications        Medication List    TAKE these medications   acidophilus Caps capsule Take 2 capsules by mouth daily.   albuterol 108 (90 Base) MCG/ACT inhaler Commonly known as:  PROVENTIL HFA;VENTOLIN HFA Inhale 2 puffs into the lungs every 6 (six) hours as needed for wheezing or shortness of breath.   apixaban 5 MG Tabs tablet Commonly known as:  ELIQUIS Take 1 tablet (5 mg total) by mouth 2 (two) times daily.   atorvastatin 40 MG tablet Commonly known as:  LIPITOR Take 1 tablet (40 mg total) by mouth at bedtime.   bismuth subsalicylate 161 WR/60AV suspension Commonly known as:  PEPTO BISMOL Take 30 mLs by mouth every 4 (four) hours as needed for diarrhea or loose stools.   budesonide-formoterol 160-4.5 MCG/ACT inhaler Commonly known as:  SYMBICORT Inhale 2 puffs into the lungs 2 (two) times daily.   dicyclomine 10 MG capsule Commonly known as:  BENTYL Take 1 capsule (10 mg total) by mouth 4 (four) times daily -  before meals and at bedtime.   diphenhydrAMINE 25 mg capsule Commonly known as:  BENADRYL Take 2 capsules (50 mg total) by mouth every 6 (six) hours as needed.   feeding supplement (GLUCERNA SHAKE) Liqd Take 237 mLs by mouth 3 (three) times daily between meals.    furosemide 20 MG tablet Commonly known as:  LASIX Take 1 tablet (20 mg total) by mouth daily.   insulin aspart 100 UNIT/ML injection Commonly known as:  novoLOG Inject 5 Units into the skin 3 (three) times daily with meals as needed for high blood sugar. Pt uses as needed per sliding scale. What changed:  how much to take   insulin detemir 100 UNIT/ML injection Commonly known as:  LEVEMIR Inject 0.05 mLs (5 Units total) into the skin at bedtime.   levothyroxine 75 MCG tablet Commonly known as:  SYNTHROID, LEVOTHROID Take 1 tablet (75 mcg total) by mouth daily before breakfast.   metoCLOPramide 10 MG tablet Commonly known as:  REGLAN Take 1 tablet (10 mg total) by mouth 4 (four) times daily -  before meals and at bedtime.   metoprolol tartrate 25 MG tablet Commonly known as:  LOPRESSOR Take 1 tablet (25 mg total) by mouth 2 (two) times daily.   morphine 15 MG 12 hr tablet Commonly known as:  MS CONTIN Take 1 tablet (15 mg total) by mouth every 8 (eight) hours.   ondansetron 4 MG disintegrating tablet Commonly known as:  ZOFRAN ODT Take 1 tablet (4 mg total) by mouth every 8 (eight) hours as needed for nausea or  vomiting.   oxyCODONE 5 MG immediate release tablet Commonly known as:  Oxy IR/ROXICODONE Take 1 tablet (5 mg total) by mouth every 6 (six) hours as needed for severe pain. What changed:  how much to take   pantoprazole 40 MG tablet Commonly known as:  PROTONIX Take 1 tablet (40 mg total) by mouth 2 (two) times daily before a meal. What changed:  Another medication with the same name was removed. Continue taking this medication, and follow the directions you see here.   rifaximin 550 MG Tabs tablet Commonly known as:  XIFAXAN Take 1 tablet (550 mg total) by mouth 2 (two) times daily.   spironolactone 25 MG tablet Commonly known as:  ALDACTONE Take 1 tablet (25 mg total) by mouth daily.   sucralfate 1 GM/10ML suspension Commonly known as:  CARAFATE Take 10  mLs (1 g total) by mouth 4 (four) times daily -  with meals and at bedtime. What changed:  when to take this   tiZANidine 4 MG tablet Commonly known as:  ZANAFLEX Take 1 tablet (4 mg total) by mouth 3 (three) times daily as needed for muscle spasms.         Diet and Activity recommendation: See Discharge Instructions above   Consults obtained - none   Major procedures and Radiology Reports - PLEASE review detailed and final reports for all details, in brief -      Dg Abd 2 Views  Result Date: 05/11/2016 CLINICAL DATA:  Acute onset of nausea and vomiting. Upper abdominal pain. Initial encounter. EXAM: ABDOMEN - 2 VIEW COMPARISON:  CT of the abdomen and pelvis from 03/13/2016 FINDINGS: The visualized bowel gas pattern is unremarkable. Scattered air and stool filled loops of colon are seen; no abnormal dilatation of small bowel loops is seen to suggest small bowel obstruction. No free intra-abdominal air is identified on the provided upright view. Clips are noted within the right upper quadrant, reflecting prior cholecystectomy. The visualized osseous structures are within normal limits; the sacroiliac joints are unremarkable in appearance. The visualized lung bases are essentially clear. IMPRESSION: Unremarkable bowel gas pattern; no free intra-abdominal air seen. Electronically Signed   By: Garald Balding M.D.   On: 05/11/2016 21:50    Micro Results     No results found for this or any previous visit (from the past 240 hour(s)).     Today   Subjective:   [name] today has no headache,no chest abdominal pain,no new weakness tingling or numbness, feels much better wants to go home today.   Objective:   Blood pressure 139/67, pulse (!) 101, temperature 98.6 F (37 C), temperature source Oral, resp. rate 18, height 5' 3"  (1.6 m), weight 58.4 kg (128 lb 12.8 oz), SpO2 99 %.   Intake/Output Summary (Last 24 hours) at 05/15/16 0925 Last data filed at 05/15/16 0511   Gross per 24 hour  Intake              507 ml  Output             2600 ml  Net            -2093 ml    Exam Awake Alert, Oriented x 3, No new F.N deficits, Normal affect [place].AT,PERRAL Supple Neck,No JVD, No cervical lymphadenopathy appriciated.  Symmetrical Chest wall movement, Good air movement bilaterally, CTAB RRR,No Gallops,Rubs or new Murmurs, No Parasternal Heave +ve B.Sounds, Abd Soft, Non tender, No organomegaly appriciated, No rebound -guarding or rigidity. No Cyanosis,  Clubbing or edema, No new Rash or bruise  Data Review   CBC w Diff: Lab Results  Component Value Date   WBC 5.0 05/15/2016   HGB 9.3 (L) 05/15/2016   HGB 12.9 10/13/2014   HCT 28.1 (L) 05/15/2016   HCT 38.4 10/13/2014   PLT 93 (L) 05/15/2016   PLT 113 (L) 10/13/2014   LYMPHOPCT 8% 02/09/2016   LYMPHOPCT 24.8 10/13/2014   BANDSPCT 0 10/26/2007   MONOPCT 4% 02/09/2016   MONOPCT 9.6 10/13/2014   EOSPCT 0% 02/09/2016   EOSPCT 2.0 10/13/2014   BASOPCT 0% 02/09/2016   BASOPCT 0.4 10/13/2014    CMP: Lab Results  Component Value Date   NA 138 05/13/2016   NA 138 10/14/2014   K 3.8 05/13/2016   K 4.1 10/14/2014   CL 108 05/13/2016   CL 107 10/14/2014   CO2 26 05/13/2016   CO2 26 10/14/2014   BUN 21 (H) 05/13/2016   BUN 3 (L) 10/14/2014   CREATININE 0.67 05/15/2016   CREATININE 0.68 10/14/2014   PROT 7.8 05/11/2016   PROT 5.7 (L) 10/12/2014   ALBUMIN 4.4 05/11/2016   ALBUMIN 2.7 (L) 10/12/2014   BILITOT 1.0 05/11/2016   BILITOT 0.8 10/12/2014   ALKPHOS 122 05/11/2016   ALKPHOS 78 10/12/2014   AST 76 (H) 05/11/2016   AST 42 (H) 10/12/2014   ALT 41 05/11/2016   ALT 95 (H) 10/12/2014  .   Total Time in preparing paper work, data evaluation and todays exam - 90 minutes  Shera Laubach M.D on 05/15/2016 at 9:25 AM    Note: This dictation was prepared with Dragon dictation along with smaller phrase technology. Any transcriptional errors that result from this process are  unintentional.

## 2016-05-15 NOTE — Care Management (Signed)
Discharge to home today per Dr. Dian Queen. Encouraged many times to call for primary care physician. Family will transport. Shelbie Ammons RN MSN CCM Care Management [phone]

## 2016-05-15 NOTE — Progress Notes (Signed)
Notified Dr Vianne Bulls of low Blood pressure, MD ordered 1 L bolus of normal saline and recheck BP.

## 2016-05-16 DIAGNOSIS — R112 Nausea with vomiting, unspecified: Secondary | ICD-10-CM | POA: Diagnosis not present

## 2016-05-16 DIAGNOSIS — R000 Tachycardia, unspecified: Secondary | ICD-10-CM | POA: Diagnosis not present

## 2016-05-16 DIAGNOSIS — K3184 Gastroparesis: Secondary | ICD-10-CM | POA: Diagnosis not present

## 2016-05-16 DIAGNOSIS — E860 Dehydration: Secondary | ICD-10-CM | POA: Diagnosis not present

## 2016-05-16 LAB — GLUCOSE, CAPILLARY
GLUCOSE-CAPILLARY: 134 mg/dL — AB (ref 65–99)
GLUCOSE-CAPILLARY: 147 mg/dL — AB (ref 65–99)

## 2016-05-16 NOTE — Plan of Care (Signed)
Problem: Pain Managment: Goal: General experience of comfort will improve Outcome: Not Progressing Patient is still complaining of intermittent aching abdominal pain.  Oxycodone 5 mg PO given at 05/15/16 2021 and can have it every 4 hours PRN. Rest and comforts maintained.

## 2016-05-16 NOTE — Discharge Summary (Signed)
[name], is a 60 y.o. female  DOB 09-23-1955  MRN [id number].  Admission date:  05/11/2016  Admitting Physician  Harvie Bridge, DO  Discharge Date:  05/16/2016   Primary MD  No PCP Per Patient  Recommendations for primary care physician for things to follow:   advised The patient to make appointments with primary doctors, social worker informed her about [facility],  [facility] accepting new patients. Advised patient to make appointments.   Admission Diagnosis  Gastroparesis [K31.84] Vomiting [R11.10] Intractable vomiting with nausea, vomiting of unspecified type [R11.10]   Discharge Diagnosis  Gastroparesis [K31.84] Vomiting [R11.10] Intractable vomiting with nausea, vomiting of unspecified type [R11.10]    Active Problems:   Gastroparesis      Past Medical History:  Diagnosis Date  . Allergy   . Anxiety   . Cancer (HCC)    HX OF CANCER OF UTERUS   . Cirrhosis of liver not due to alcohol ([place]) 2016  . Degenerative disk disease   . Diverticulitis   . Gastroparesis   . GERD (gastroesophageal reflux disease)   . History of hiatal hernia   . Hypertension   . Hypothyroid   . Intussusception intestine ([place]) 05/2015  . PAF (paroxysmal atrial fibrillation) ([place]) 03/2015   a. new onset 03/2015 in setting of intractable N/V; b. on Eliquis 5 mg bid; c. CHADSVASc 4 (DM, TIA x 2, female)  . Pancreatitis   . Sick sinus syndrome ([place])   . Stomach ulcer   . Stroke [facility])    with minimal left sided weakness  . Syncope 01/2015  . TIA (transient ischemic attack) 02/2015  . Type 1 diabetes ([place])    on levemir    Past Surgical History:  Procedure Laterality Date  . ABDOMINAL HYSTERECTOMY    . CARDIAC CATHETERIZATION N/A 01/12/2016   Procedure: Left Heart Cath and Coronary Angiography;   Surgeon: Wellington Hampshire, MD;  Location: [place] CV LAB;  Service: Cardiovascular;  Laterality: N/A;  . CHOLECYSTECTOMY    . ESOPHAGOGASTRODUODENOSCOPY N/A 04/04/2015   Procedure: ESOPHAGOGASTRODUODENOSCOPY (EGD);  Surgeon: Hulen Luster, MD;  Location: [facility] ENDOSCOPY;  Service: Endoscopy;  Laterality: N/A;  . ESOPHAGOGASTRODUODENOSCOPY (EGD) WITH PROPOFOL N/A 01/18/2016   Procedure: ESOPHAGOGASTRODUODENOSCOPY (EGD) WITH PROPOFOL;  Surgeon: Lucilla Lame, MD;  Location: [facility] ENDOSCOPY;  Service: Endoscopy;  Laterality: N/A;  . FLEXIBLE SIGMOIDOSCOPY N/A 01/18/2016   Procedure: FLEXIBLE SIGMOIDOSCOPY;  Surgeon: Lucilla Lame, MD;  Location: [facility] ENDOSCOPY;  Service: Endoscopy;  Laterality: N/A;  . HERNIA REPAIR         History of present illness and  Hospital Course:     Kindly see H&P for history of present illness and admission details, please review complete Labs, Consult reports and Test reports for all details in brief  HPI  from the history and physical done on the day of admission Admitted on September 1 because of epigastric pain with intractable vomiting, nausea at home. Patient well-known to hospitalist service for multiple admissions because of epigastric pain, gastroparesis, medication noncompliance.   Hospital Course    #1 intractable nausea, vomiting secondary to diabetic gastroparesis: Admitted to hospitalist service, started on IV fluids, IV nausea medications. Also started on Reglan, CAT scan of abdomen, pelvis obtained. It showed cirrhosis of the liver, some thickening of the ascending colon due to ascites, acute colitis. Patient improved with conservative treatment with IV fluids, nausea medications. Did not have any fever, elevated white count. Her kidney function remained  stable. Patient is tolerating regular food today. And she is eager to go home. Patient told me that she is not taking medicines for the last 2 months. And we'll give prescription for a month supply. Advised  her to make appointment with primary doctors.  #2. Diabetes mellitus type 2: The patient is on Levemir, SSI with Coverage. #3 history of liver cirrhosis. Patient is on Lasix, Aldactone. Patient advised to continue them. #4 chronic pain syndrome, patient is on OxyContin, oxycodone.  Gave prescription for 20 tablets of each only. #5 proximal atrial fibrillation: Patient is on  Eliquis,Bblocker #6 essential hypertension: Controlled. #7 GERD continue PPI, sucralfate. #8 . 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 #9 history of COPD: No wheezing. Continue home inhalers. Pt supposed to go home on9/5 but  Her bp dropped with 1 mg of IV dilaudid needing fluid resuscitation.so today pt feels better,BP much better,she denies any abdominal pain,dizzines.eager to go home. adivsed her to hold lasix and metoprolol for one more day and resume from Friday 9/8/.  Discharge Condition: stable   Follow UP'      Discharge Instructions  and  Discharge Medications     Discharge Instructions    Discharge instructions    Complete by:  As directed   Hold lasix and metoprolol till tomorrow,resume from Friday,have BP check at home goal is 120/80,then you can take your lasix and metoprolol       Medication List    STOP taking these medications   furosemide 20 MG tablet Commonly known as:  LASIX   metoprolol tartrate 25 MG tablet Commonly known as:  LOPRESSOR     TAKE these medications   acidophilus Caps capsule Take 2 capsules by mouth daily.   albuterol 108 (90 Base) MCG/ACT inhaler Commonly known as:  PROVENTIL HFA;VENTOLIN HFA Inhale 2 puffs into the lungs every 6 (six) hours as needed for wheezing or shortness of breath.   apixaban 5 MG Tabs tablet Commonly known as:  ELIQUIS Take 1 tablet (5 mg total) by mouth 2 (two) times daily.   atorvastatin 40 MG tablet Commonly known as:  LIPITOR Take 1 tablet (40 mg total) by mouth at bedtime.   bismuth  subsalicylate 283 MO/29UT suspension Commonly known as:  PEPTO BISMOL Take 30 mLs by mouth every 4 (four) hours as needed for diarrhea or loose stools.   budesonide-formoterol 160-4.5 MCG/ACT inhaler Commonly known as:  SYMBICORT Inhale 2 puffs into the lungs 2 (two) times daily.   dicyclomine 10 MG capsule Commonly known as:  BENTYL Take 1 capsule (10 mg total) by mouth 4 (four) times daily -  before meals and at bedtime.   diphenhydrAMINE 25 mg capsule Commonly known as:  BENADRYL Take 2 capsules (50 mg total) by mouth every 6 (six) hours as needed.   feeding supplement (GLUCERNA SHAKE) Liqd Take 237 mLs by mouth 3 (three) times daily between meals.   insulin aspart 100 UNIT/ML injection Commonly known as:  novoLOG Inject 5 Units into the skin 3 (three) times daily with meals as needed for high blood sugar. Pt uses as needed per sliding scale. What changed:  how much to take   insulin detemir 100 UNIT/ML injection Commonly known as:  LEVEMIR Inject 0.05 mLs (5 Units total) into the skin at bedtime.   levothyroxine 75 MCG tablet Commonly known as:  SYNTHROID, LEVOTHROID Take 1 tablet (75 mcg total) by mouth daily before breakfast.   metoCLOPramide 10 MG tablet Commonly  known as:  REGLAN Take 1 tablet (10 mg total) by mouth 4 (four) times daily -  before meals and at bedtime.   morphine 15 MG 12 hr tablet Commonly known as:  MS CONTIN Take 1 tablet (15 mg total) by mouth every 8 (eight) hours.   ondansetron 4 MG disintegrating tablet Commonly known as:  ZOFRAN ODT Take 1 tablet (4 mg total) by mouth every 8 (eight) hours as needed for nausea or vomiting.   oxyCODONE 5 MG immediate release tablet Commonly known as:  Oxy IR/ROXICODONE Take 1 tablet (5 mg total) by mouth every 6 (six) hours as needed for severe pain. What changed:  how much to take   pantoprazole 40 MG tablet Commonly known as:  PROTONIX Take 1 tablet (40 mg total) by mouth 2 (two) times daily before a  meal. What changed:  Another medication with the same name was removed. Continue taking this medication, and follow the directions you see here.   rifaximin 550 MG Tabs tablet Commonly known as:  XIFAXAN Take 1 tablet (550 mg total) by mouth 2 (two) times daily.   spironolactone 25 MG tablet Commonly known as:  ALDACTONE Take 1 tablet (25 mg total) by mouth daily.   sucralfate 1 GM/10ML suspension Commonly known as:  CARAFATE Take 10 mLs (1 g total) by mouth 4 (four) times daily -  with meals and at bedtime. What changed:  when to take this   tiZANidine 4 MG tablet Commonly known as:  ZANAFLEX Take 1 tablet (4 mg total) by mouth 3 (three) times daily as needed for muscle spasms.         Diet and Activity recommendation: See Discharge Instructions above   Consults obtained - none   Major procedures and Radiology Reports - PLEASE review detailed and final reports for all details, in brief -      Dg Abd 2 Views  Result Date: 05/11/2016 CLINICAL DATA:  Acute onset of nausea and vomiting. Upper abdominal pain. Initial encounter. EXAM: ABDOMEN - 2 VIEW COMPARISON:  CT of the abdomen and pelvis from 03/13/2016 FINDINGS: The visualized bowel gas pattern is unremarkable. Scattered air and stool filled loops of colon are seen; no abnormal dilatation of small bowel loops is seen to suggest small bowel obstruction. No free intra-abdominal air is identified on the provided upright view. Clips are noted within the right upper quadrant, reflecting prior cholecystectomy. The visualized osseous structures are within normal limits; the sacroiliac joints are unremarkable in appearance. The visualized lung bases are essentially clear. IMPRESSION: Unremarkable bowel gas pattern; no free intra-abdominal air seen. Electronically Signed   By: Garald Balding M.D.   On: 05/11/2016 21:50    Micro Results     No results found for this or any previous visit (from the past 240 hour(s)).     Today    Subjective:   [name] today has no headache,no chest abdominal pain,no new weakness tingling or numbness, feels much better wants to go home today.   Objective:   Blood pressure (!) 100/53, pulse 71, temperature 98 F (36.7 C), temperature source Oral, resp. rate 18, height 5' 3"  (1.6 m), weight 58.4 kg (128 lb 12.8 oz), SpO2 100 %.   Intake/Output Summary (Last 24 hours) at 05/16/16 0751 Last data filed at 05/16/16 0624  Gross per 24 hour  Intake              974 ml  Output  900 ml  Net               74 ml    Exam Awake Alert, Oriented x 3, No new F.N deficits, Normal affect [place].AT,PERRAL Supple Neck,No JVD, No cervical lymphadenopathy appriciated.  Symmetrical Chest wall movement, Good air movement bilaterally, CTAB RRR,No Gallops,Rubs or new Murmurs, No Parasternal Heave +ve B.Sounds, Abd Soft, Non tender, No organomegaly appriciated, No rebound -guarding or rigidity. No Cyanosis, Clubbing or edema, No new Rash or bruise  Data Review   CBC w Diff:  Lab Results  Component Value Date   WBC 5.0 05/15/2016   HGB 9.3 (L) 05/15/2016   HGB 12.9 10/13/2014   HCT 28.1 (L) 05/15/2016   HCT 38.4 10/13/2014   PLT 93 (L) 05/15/2016   PLT 113 (L) 10/13/2014   LYMPHOPCT 8% 02/09/2016   LYMPHOPCT 24.8 10/13/2014   BANDSPCT 0 10/26/2007   MONOPCT 4% 02/09/2016   MONOPCT 9.6 10/13/2014   EOSPCT 0% 02/09/2016   EOSPCT 2.0 10/13/2014   BASOPCT 0% 02/09/2016   BASOPCT 0.4 10/13/2014    CMP:  Lab Results  Component Value Date   NA 138 05/13/2016   NA 138 10/14/2014   K 3.8 05/13/2016   K 4.1 10/14/2014   CL 108 05/13/2016   CL 107 10/14/2014   CO2 26 05/13/2016   CO2 26 10/14/2014   BUN 21 (H) 05/13/2016   BUN 3 (L) 10/14/2014   CREATININE 0.67 05/15/2016   CREATININE 0.68 10/14/2014   PROT 7.8 05/11/2016   PROT 5.7 (L) 10/12/2014   ALBUMIN 4.4 05/11/2016   ALBUMIN 2.7 (L) 10/12/2014   BILITOT 1.0 05/11/2016   BILITOT 0.8 10/12/2014   ALKPHOS  122 05/11/2016   ALKPHOS 78 10/12/2014   AST 76 (H) 05/11/2016   AST 42 (H) 10/12/2014   ALT 41 05/11/2016   ALT 95 (H) 10/12/2014  .   Total Time in preparing paper work, data evaluation and todays exam - 33 minutes  Kalab Camps M.D on 05/16/2016 at 7:51 AM    Note: This dictation was prepared with Dragon dictation along with smaller phrase technology. Any transcriptional errors that result from this process are unintentional.

## 2016-05-16 NOTE — Progress Notes (Signed)
[place] at [facility]                                                                                                                                                                                            Patient Demographics   [name], is a 60 y.o. female, DOB - November 21, 1955, OIT:[id number]  Admit date - 05/11/2016   Admitting Physician Harvie Bridge, DO  Outpatient Primary MD for the patient is No PCP Per Patient   LOS - 5  Subjective: Charge canceled today because of low blood pressure. Patient will 1 mg of IV Dilaudid because of abdominal pain. She  is tolerating the regular food.  Review of Systems:   CONSTITUTIONAL: No documented fever. No fatigue, weakness. No weight gain, no weight loss.  EYES: No blurry or double vision.  ENT: No tinnitus. No postnasal drip. No redness of the oropharynx.  RESPIRATORY: No cough, no wheeze, no hemoptysis. No dyspnea.  CARDIOVASCULAR: No chest pain. No orthopnea. No palpitations. No syncope.  GASTROINTESTINAL: Positive nausea, positive vomiting No diarrhea. Positive abdominal pain. No melena or hematochezia.  GENITOURINARY: No dysuria or hematuria.  ENDOCRINE: No polyuria or nocturia. No heat or cold intolerance.  HEMATOLOGY: No anemia. No bruising. No bleeding.  INTEGUMENTARY: No rashes. No lesions.  MUSCULOSKELETAL: No arthritis. No swelling. No gout.  NEUROLOGIC: No numbness, tingling, or ataxia. No seizure-type activity.  PSYCHIATRIC: No anxiety. No insomnia. No ADD.    Vitals:   Vitals:   05/15/16 1407 05/15/16 1559 05/15/16 2034 05/16/16 0353  BP: (!) 73/41 (!) 98/48 (!) 116/56 (!) 100/53  Pulse: 71  84 71  Resp:   17 18  Temp:   98.4 F (36.9 C) 98 F (36.7 C)  TempSrc:   Oral Oral  SpO2:   98% 100%  Weight:      Height:        Wt Readings from Last 3 Encounters:  05/12/16 58.4 kg (128 lb 12.8 oz)  04/27/16 59.9 kg (132 lb)  04/09/16 63.5 kg (139 lb 14.4 oz)      Intake/Output Summary (Last 24 hours) at 05/16/16 0751 Last data filed at 05/16/16 5830  Gross per 24 hour  Intake              974 ml  Output              900 ml  Net               74 ml    Physical Exam:   GENERAL: Pleasant-appearing in no apparent distress.  HEAD, EYES, EARS, NOSE AND  THROAT: Atraumatic, normocephalic. Extraocular muscles are intact. Pupils equal and reactive to light. Sclerae anicteric. No conjunctival injection. No oro-pharyngeal erythema. Right sided conjunctival erythema NECK: Supple. There is no jugular venous distention. No bruits, no lymphadenopathy, no thyromegaly.  HEART: Regular rate and rhythm,. No murmurs, no rubs, no clicks.  LUNGS: Clear to auscultation bilaterally. No rales or rhonchi. No wheezes.  ABDOMEN: Soft, flat mild epigastric tenderness, nondistended. Has good bowel sounds. No hepatosplenomegaly appreciated.  EXTREMITIES: No evidence of any cyanosis, clubbing, or peripheral edema.  +2 pedal and radial pulses bilaterally.  NEUROLOGIC: The patient is alert, awake, and oriented x3 with no focal motor or sensory deficits appreciated bilaterally.  SKIN: Moist and warm with no rashes appreciated.  Psych: Not anxious, depressed LN: No inguinal LN enlargement    Antibiotics   Anti-infectives    Start     Dose/Rate Route Frequency Ordered Stop   05/15/16 0000  rifaximin (XIFAXAN) 550 MG TABS tablet     550 mg Oral 2 times daily 05/15/16 0818     05/12/16 0300  rifaximin (XIFAXAN) tablet 550 mg     550 mg Oral 2 times daily 05/12/16 0113        Medications   Scheduled Meds: . acidophilus  2 capsule Oral Daily  . apixaban  5 mg Oral BID  . atorvastatin  40 mg Oral QHS  . ciprofloxacin  2 drop Right Eye Q2H while awake  . dicyclomine  10 mg Oral TID AC & HS  . feeding supplement (GLUCERNA SHAKE)  237 mL Oral TID BM  . insulin aspart  0-9 Units Subcutaneous Q4H  . insulin detemir  5 Units Subcutaneous QHS  . levothyroxine  75 mcg Oral  QAC breakfast  . metoCLOPramide  10 mg Oral TID AC & HS  . pantoprazole  40 mg Oral QAC breakfast  . rifaximin  550 mg Oral BID  . spironolactone  25 mg Oral Daily  . sucralfate  1 g Oral TID AC & HS   Continuous Infusions: . sodium chloride 60 mL/hr at 05/16/16 0331   PRN Meds:.acetaminophen **OR** acetaminophen, albuterol, bismuth subsalicylate, diphenhydrAMINE, hydrALAZINE, oxyCODONE, tiZANidine, zolpidem   Data Review:   Micro Results No results found for this or any previous visit (from the past 240 hour(s)).  Radiology Reports Dg Abd 2 Views  Result Date: 05/11/2016 CLINICAL DATA:  Acute onset of nausea and vomiting. Upper abdominal pain. Initial encounter. EXAM: ABDOMEN - 2 VIEW COMPARISON:  CT of the abdomen and pelvis from 03/13/2016 FINDINGS: The visualized bowel gas pattern is unremarkable. Scattered air and stool filled loops of colon are seen; no abnormal dilatation of small bowel loops is seen to suggest small bowel obstruction. No free intra-abdominal air is identified on the provided upright view. Clips are noted within the right upper quadrant, reflecting prior cholecystectomy. The visualized osseous structures are within normal limits; the sacroiliac joints are unremarkable in appearance. The visualized lung bases are essentially clear. IMPRESSION: Unremarkable bowel gas pattern; no free intra-abdominal air seen. Electronically Signed   By: Garald Balding M.D.   On: 05/11/2016 21:50     CBC  Recent Labs Lab 05/11/16 2108 05/12/16 0444 05/15/16 0424  WBC 6.9 6.7 5.0  HGB 11.2* 11.0* 9.3*  HCT 34.3* 33.7* 28.1*  PLT 145* 120* 93*  MCV 77.4* 78.0* 77.7*  MCH 25.3* 25.4* 25.8*  MCHC 32.6 32.5 33.2  RDW 18.7* 18.8* 18.5*    Chemistries   Recent Labs Lab 05/11/16 2108 05/12/16  0444 05/13/16 0403 05/15/16 0424  NA 138 138 138  --   K 4.9 3.2* 3.8  --   CL 104 107 108  --   CO2 20* 22 26  --   GLUCOSE 354* 351* 170*  --   BUN 20 20 21*  --   CREATININE  0.89 0.79 0.68 0.67  CALCIUM 9.6 9.3 8.4*  --   MG 1.6*  --   --   --   AST 76*  --   --   --   ALT 41  --   --   --   ALKPHOS 122  --   --   --   BILITOT 1.0  --   --   --    ------------------------------------------------------------------------------------------------------------------ estimated creatinine clearance is 60.3 mL/min (by C-G formula based on SCr of 0.8 mg/dL). ------------------------------------------------------------------------------------------------------------------ No results for input(s): HGBA1C in the last 72 hours. ------------------------------------------------------------------------------------------------------------------ No results for input(s): CHOL, HDL, LDLCALC, TRIG, CHOLHDL, LDLDIRECT in the last 72 hours. ------------------------------------------------------------------------------------------------------------------ No results for input(s): TSH, T4TOTAL, T3FREE, THYROIDAB in the last 72 hours.  Invalid input(s): FREET3 ------------------------------------------------------------------------------------------------------------------ No results for input(s): VITAMINB12, FOLATE, FERRITIN, TIBC, IRON, RETICCTPCT in the last 72 hours.  Coagulation profile No results for input(s): INR, PROTIME in the last 168 hours.  No results for input(s): DDIMER in the last 72 hours.  Cardiac Enzymes No results for input(s): CKMB, TROPONINI, MYOGLOBIN in the last 168 hours.  Invalid input(s): CK ------------------------------------------------------------------------------------------------------------------ Invalid input(s): [place]   This is a 60 y.o. female with a history ofdiabetic gastroparesis, atrial fibrillation nonalcoholic cirrhosis of the liver and sick sinus syndrome now being admitted with:  1. Intractable nausea and vomiting: Secondary to gastroparesis.  Recurrent in nature Continue Reglan and antiemetics disCharge  canceled because of low blood pressure.  2. Tachycardia, likely multifactorial. Suspect due to anxiety and pain   3. Essential hypertension: Now hypotensive.  4. Paroxysmal atrial fibrillation,-Continue Eliquis 5. Prolonged QT interval: This is a chronic issue. Continue to monitor on telemetry  6. Diabetes mellitus 2 continue sliding scale and levemir 7.COPD: Continue inhaled corticosteroid as well as albuterol as needed. 8. Hypothyroidism: Continue Synthroid 9. Hyperlipidemia: Continue statin therapy 10. Cirrhosis of liver: Continue rifaximinas well as Lasix and spironolactone per home regimen 11. DVT prophylaxis:  patient is on Eliquis     Code Status Orders        Start     Ordered   05/12/16 0114  Full code  Continuous     05/12/16 0113    Code Status History    Date Active Date Inactive Code Status Order ID Comments User Context   04/07/2016  6:18 AM 04/09/2016  2:19 PM DNR [id number]  Harrie Foreman, MD Inpatient   03/13/2016  9:25 PM 03/16/2016  6:32 PM DNR [id number]  Lance Coon, MD Inpatient   02/04/2016  9:39 AM 02/07/2016  2:59 PM DNR [id number]  Saundra Shelling, MD Inpatient   01/21/2016 10:24 PM 01/25/2016  5:02 PM DNR [id number]  Lance Coon, MD Inpatient   01/15/2016  1:39 PM 01/19/2016  3:29 PM DNR [id number]  Gladstone Lighter, MD ED   01/10/2016 10:00 PM 01/13/2016  5:24 PM DNR [id number]  Harrie Foreman, MD Inpatient   12/27/2015 11:02 PM 01/01/2016  6:17 PM DNR [id number]  Max Sane, MD Inpatient   12/16/2015  4:54 PM 12/19/2015  4:52 PM DNR [id number]  Loletha Grayer, MD ED   11/11/2015 11:36 AM 11/16/2015  2:05 PM Full Code [id number]  Samella Parr, NP Inpatient   10/15/2015  1:23 AM 10/16/2015  6:17 PM Full Code [id number]  Lance Coon, MD Inpatient   10/04/2015  8:19 PM 10/08/2015  6:34 PM Full Code [id number]  Bethena Roys, MD Inpatient   08/15/2015  1:05 AM 08/16/2015  8:04 PM Full Code [id number]  Harrie Foreman, MD Inpatient   08/01/2015  1:25 AM 08/06/2015  5:41 PM  Full Code [id number]  Harrie Foreman, MD Inpatient   07/04/2015  2:53 AM 07/11/2015  6:33 PM Full Code [id number]  Lytle Butte, MD ED   05/27/2015 10:48 PM 05/31/2015  2:37 PM Full Code [id number]  Rise Patience, MD Inpatient   05/19/2015  1:14 AM 05/22/2015  4:28 PM Full Code [id number]  Rise Patience, MD Inpatient   04/05/2015  9:16 AM 04/07/2015  5:23 PM Full Code [id number]  Bettey Costa, MD Inpatient   04/03/2015 12:31 PM 04/05/2015  9:16 AM Full Code [id number]  Bettey Costa, MD Inpatient   03/11/2015  4:49 PM 03/14/2015  3:50 PM Full Code [id number]  Dustin Flock, MD Inpatient   02/15/2015  4:51 PM 02/17/2015  2:33 PM DNR [id number]  Dustin Flock, MD Inpatient   02/15/2015  4:35 PM 02/15/2015  4:51 PM Full Code [id number]  Dustin Flock, MD Inpatient   01/11/2015  2:40 AM 01/12/2015  6:11 PM Full Code [id number]  Etta Quill, DO ED    Advance Directive Documentation   Flowsheet Row Most Recent Value  Type of Advance Directive  Healthcare Power of Attorney, Living will  Pre-existing out of facility DNR order (yellow form or pink MOST form)  No data  "MOST" Form in Place?  No data   Discharge tomorrow Cancel the discharge today because of hypotension.       Consults  noen   DVT Prophylaxis eliquis  Lab Results  Component Value Date   PLT 93 (L) 05/15/2016     Time Spent in minutes   74mn      Brixton Schnapp M.D on 05/16/2016 at 7:51 AM  Between 7am to 6pm - Pager - [phone]  After 6pm go to [email or web address] - password [email or web address]   Office  [phone]

## 2016-05-16 NOTE — Progress Notes (Signed)
Discussed discharge instructions and medications with pt. IV removed. All questions addressed. Pt transported home via car by her husband.

## 2016-06-01 ENCOUNTER — Observation Stay
Admission: EM | Admit: 2016-06-01 | Discharge: 2016-06-06 | Disposition: A | Discharge status: Home / Self Care | Payer: Commercial Managed Care - HMO | Attending: Internal Medicine | Admitting: Internal Medicine

## 2016-06-01 DIAGNOSIS — K449 Diaphragmatic hernia without obstruction or gangrene: Secondary | ICD-10-CM | POA: Diagnosis not present

## 2016-06-01 DIAGNOSIS — F1123 Opioid dependence with withdrawal: Secondary | ICD-10-CM | POA: Insufficient documentation

## 2016-06-01 DIAGNOSIS — K567 Ileus, unspecified: Secondary | ICD-10-CM | POA: Diagnosis not present

## 2016-06-01 DIAGNOSIS — Z8542 Personal history of malignant neoplasm of other parts of uterus: Secondary | ICD-10-CM | POA: Diagnosis not present

## 2016-06-01 DIAGNOSIS — R188 Other ascites: Secondary | ICD-10-CM | POA: Insufficient documentation

## 2016-06-01 DIAGNOSIS — E1065 Type 1 diabetes mellitus with hyperglycemia: Secondary | ICD-10-CM | POA: Insufficient documentation

## 2016-06-01 DIAGNOSIS — Z23 Encounter for immunization: Secondary | ICD-10-CM | POA: Insufficient documentation

## 2016-06-01 DIAGNOSIS — I10 Essential (primary) hypertension: Secondary | ICD-10-CM | POA: Diagnosis not present

## 2016-06-01 DIAGNOSIS — R4701 Aphasia: Secondary | ICD-10-CM | POA: Diagnosis not present

## 2016-06-01 DIAGNOSIS — M5126 Other intervertebral disc displacement, lumbar region: Secondary | ICD-10-CM | POA: Diagnosis not present

## 2016-06-01 DIAGNOSIS — E1043 Type 1 diabetes mellitus with diabetic autonomic (poly)neuropathy: Secondary | ICD-10-CM | POA: Diagnosis not present

## 2016-06-01 DIAGNOSIS — T182XXA Foreign body in stomach, initial encounter: Secondary | ICD-10-CM | POA: Diagnosis not present

## 2016-06-01 DIAGNOSIS — E876 Hypokalemia: Secondary | ICD-10-CM | POA: Insufficient documentation

## 2016-06-01 DIAGNOSIS — R1013 Epigastric pain: Secondary | ICD-10-CM

## 2016-06-01 DIAGNOSIS — E039 Hypothyroidism, unspecified: Secondary | ICD-10-CM | POA: Diagnosis not present

## 2016-06-01 DIAGNOSIS — R112 Nausea with vomiting, unspecified: Secondary | ICD-10-CM | POA: Diagnosis not present

## 2016-06-01 DIAGNOSIS — X58XXXA Exposure to other specified factors, initial encounter: Secondary | ICD-10-CM | POA: Diagnosis not present

## 2016-06-01 DIAGNOSIS — K7469 Other cirrhosis of liver: Secondary | ICD-10-CM | POA: Insufficient documentation

## 2016-06-01 DIAGNOSIS — Z8673 Personal history of transient ischemic attack (TIA), and cerebral infarction without residual deficits: Secondary | ICD-10-CM | POA: Insufficient documentation

## 2016-06-01 DIAGNOSIS — K561 Intussusception: Secondary | ICD-10-CM | POA: Diagnosis not present

## 2016-06-01 DIAGNOSIS — Z8249 Family history of ischemic heart disease and other diseases of the circulatory system: Secondary | ICD-10-CM | POA: Insufficient documentation

## 2016-06-01 DIAGNOSIS — K2970 Gastritis, unspecified, without bleeding: Secondary | ICD-10-CM | POA: Insufficient documentation

## 2016-06-01 DIAGNOSIS — Z9119 Patient's noncompliance with other medical treatment and regimen: Secondary | ICD-10-CM | POA: Insufficient documentation

## 2016-06-01 DIAGNOSIS — K3184 Gastroparesis: Secondary | ICD-10-CM

## 2016-06-01 DIAGNOSIS — J449 Chronic obstructive pulmonary disease, unspecified: Secondary | ICD-10-CM | POA: Insufficient documentation

## 2016-06-01 DIAGNOSIS — K219 Gastro-esophageal reflux disease without esophagitis: Secondary | ICD-10-CM | POA: Diagnosis not present

## 2016-06-01 DIAGNOSIS — I951 Orthostatic hypotension: Secondary | ICD-10-CM | POA: Insufficient documentation

## 2016-06-01 DIAGNOSIS — Z79899 Other long term (current) drug therapy: Secondary | ICD-10-CM | POA: Insufficient documentation

## 2016-06-01 DIAGNOSIS — R000 Tachycardia, unspecified: Secondary | ICD-10-CM | POA: Insufficient documentation

## 2016-06-01 DIAGNOSIS — I495 Sick sinus syndrome: Secondary | ICD-10-CM | POA: Diagnosis not present

## 2016-06-01 DIAGNOSIS — Z881 Allergy status to other antibiotic agents status: Secondary | ICD-10-CM | POA: Insufficient documentation

## 2016-06-01 DIAGNOSIS — I420 Dilated cardiomyopathy: Secondary | ICD-10-CM | POA: Diagnosis not present

## 2016-06-01 DIAGNOSIS — F419 Anxiety disorder, unspecified: Secondary | ICD-10-CM | POA: Diagnosis not present

## 2016-06-01 DIAGNOSIS — Z9049 Acquired absence of other specified parts of digestive tract: Secondary | ICD-10-CM | POA: Insufficient documentation

## 2016-06-01 DIAGNOSIS — Z8719 Personal history of other diseases of the digestive system: Secondary | ICD-10-CM | POA: Insufficient documentation

## 2016-06-01 DIAGNOSIS — I214 Non-ST elevation (NSTEMI) myocardial infarction: Secondary | ICD-10-CM | POA: Insufficient documentation

## 2016-06-01 DIAGNOSIS — G894 Chronic pain syndrome: Secondary | ICD-10-CM | POA: Insufficient documentation

## 2016-06-01 DIAGNOSIS — Z7901 Long term (current) use of anticoagulants: Secondary | ICD-10-CM | POA: Insufficient documentation

## 2016-06-01 DIAGNOSIS — Z794 Long term (current) use of insulin: Secondary | ICD-10-CM | POA: Insufficient documentation

## 2016-06-01 DIAGNOSIS — D649 Anemia, unspecified: Secondary | ICD-10-CM | POA: Insufficient documentation

## 2016-06-01 DIAGNOSIS — Z886 Allergy status to analgesic agent status: Secondary | ICD-10-CM | POA: Insufficient documentation

## 2016-06-01 DIAGNOSIS — Z90710 Acquired absence of both cervix and uterus: Secondary | ICD-10-CM | POA: Insufficient documentation

## 2016-06-01 DIAGNOSIS — I480 Paroxysmal atrial fibrillation: Secondary | ICD-10-CM | POA: Insufficient documentation

## 2016-06-01 DIAGNOSIS — E440 Moderate protein-calorie malnutrition: Secondary | ICD-10-CM | POA: Insufficient documentation

## 2016-06-01 DIAGNOSIS — Z887 Allergy status to serum and vaccine status: Secondary | ICD-10-CM | POA: Insufficient documentation

## 2016-06-01 DIAGNOSIS — Z888 Allergy status to other drugs, medicaments and biological substances status: Secondary | ICD-10-CM | POA: Insufficient documentation

## 2016-06-01 DIAGNOSIS — E785 Hyperlipidemia, unspecified: Secondary | ICD-10-CM | POA: Insufficient documentation

## 2016-06-01 DIAGNOSIS — Z885 Allergy status to narcotic agent status: Secondary | ICD-10-CM | POA: Insufficient documentation

## 2016-06-01 MED ORDER — MORPHINE SULFATE (PF) 4 MG/ML IV SOLN
INTRAVENOUS | Status: AC
  Filled 2016-06-01: qty 1

## 2016-06-01 MED ORDER — SODIUM CHLORIDE 0.9 % IV BOLUS (SEPSIS)
1000.0000 mL | Freq: Once | INTRAVENOUS | Status: AC
  Administered 2016-06-01: 1000 mL via INTRAVENOUS

## 2016-06-01 MED ORDER — ONDANSETRON HCL 4 MG/2ML IJ SOLN
INTRAMUSCULAR | Status: AC
  Filled 2016-06-01: qty 2

## 2016-06-01 MED ORDER — MORPHINE SULFATE (PF) 4 MG/ML IV SOLN
4.0000 mg | Freq: Once | INTRAVENOUS | Status: AC
  Administered 2016-06-01: 4 mg via INTRAVENOUS

## 2016-06-01 MED ORDER — ONDANSETRON HCL 4 MG/2ML IJ SOLN
4.0000 mg | Freq: Once | INTRAMUSCULAR | Status: AC
  Administered 2016-06-01: 4 mg via INTRAVENOUS

## 2016-06-01 NOTE — ED Notes (Signed)
Pt states she is unable to void at this time - pt to notify staff when she feels like she can give a urine sample

## 2016-06-01 NOTE — ED Provider Notes (Signed)
[facility] Emergency Department Provider Note   ____________________________________________   First MD Initiated Contact with Patient 06/01/16 2336     (approximate)  I have reviewed the triage vital signs and the nursing notes.   HISTORY  Chief Complaint Nausea    HPI [name] is a 60 y.o. female who presents to the ED from home via EMS with a chief complaint of nausea, vomiting, diarrhea and upper abdominal pain. Patient has a history of gastroparesis, last hospitalized one month ago, who reports nausea and vomiting for the past 3-4 days. She is also experiencing diarrhea which is less than the vomiting. Reports upper abdominal pain and unable to keep down her medicines. Denies associated fever, chills, chest pain, shortness of breath, dysuria. Denies recent travel or trauma. Nothing makes her symptoms better or worse.   Past Medical History:  Diagnosis Date  . Allergy   . Anxiety   . Cancer (HCC)    HX OF CANCER OF UTERUS   . Cirrhosis of liver not due to alcohol ([place]) 2016  . Degenerative disk disease   . Diverticulitis   . Gastroparesis   . GERD (gastroesophageal reflux disease)   . History of hiatal hernia   . Hypertension   . Hypothyroid   . Intussusception intestine ([place]) 05/2015  . PAF (paroxysmal atrial fibrillation) ([place]) 03/2015   a. new onset 03/2015 in setting of intractable N/V; b. on Eliquis 5 mg bid; c. CHADSVASc 4 (DM, TIA x 2, female)  . Pancreatitis   . Sick sinus syndrome ([place])   . Stomach ulcer   . Stroke [facility])    with minimal left sided weakness  . Syncope 01/2015  . TIA (transient ischemic attack) 02/2015  . Type 1 diabetes ([place])    on levemir    Patient Active Problem List   Diagnosis Date Noted  . Epigastric pain   . Abdominal pain, epigastric   . Personal history of surgery to heart and great vessels, presenting hazards to health   . Gastritis   . Foreign body in stomach   . Abnormal  findings-gastrointestinal tract   . Diarrhea   . Congestive dilated cardiomyopathy ([place])   . Generalized abdominal pain   . Nausea & vomiting   . NSTEMI (non-ST elevated myocardial infarction) ([place]) 01/11/2016  . Tachyarrhythmia 01/10/2016  . Intractable nausea and vomiting 01/10/2016  . Tachy-brady syndrome ([place]) 12/28/2015  . Abdominal pain 12/27/2015  . Colitis 12/16/2015  . Pneumonia 11/14/2015  . Narcotic withdrawal ([place]) 11/11/2015  . Diabetes mellitus type 2, insulin dependent ([place]) 11/11/2015  . Orthostatic hypotension   . H/O TIA (transient ischemic attack) and stroke   . Left-sided weakness 10/04/2015  . Expressive aphasia 10/04/2015  . Ileus ([place]) 08/01/2015  . Ascites   . Cryptogenic cirrhosis ([place]) 07/10/2015  . Paroxysmal atrial fibrillation ([place]) 07/10/2015  . C. difficile colitis 07/10/2015  . GI (gastrointestinal bleed) 07/06/2015  . Malnutrition of moderate degree 07/04/2015  . Symptomatic bradycardia 05/27/2015  . Intussusception intestine ([place])   . Hypothyroidism due to amiodarone   . Abdominal pain, chronic, epigastric   . Chronic anemia 05/19/2015  . Thrombocytopenia ([place]) 05/19/2015  . Prolonged QT interval   . Hypomagnesemia   . Narcotic abuse   . Uncontrolled type 2 diabetes mellitus with gastroparesis ([place])   . Syncope due to orthostatic hypotension 05/18/2015  . Hypokalemia 04/06/2015  . Hyperlipidemia with target LDL less than 100 04/06/2015  . CVA (cerebral infarction) 02/15/2015  .  Essential hypertension 01/12/2015  . Chronically on opiate therapy 01/12/2015  . Gastroparesis 01/12/2015  . DEPRESSION/ANXIETY 06/27/2007  . MYOFASCIAL PAIN SYNDROME 06/27/2007  . Chronic pain syndrome 03/28/2007  . GERD 03/27/2007  . DIVERTICULOSIS, COLON 03/27/2007  . LUMBAR DISC DISPLACEMENT 03/27/2007  . PROTEINURIA 03/27/2007  . UTERINE CANCER, HX OF 03/27/2007    Past Surgical History:  Procedure Laterality Date  . ABDOMINAL HYSTERECTOMY    .  CARDIAC CATHETERIZATION N/A 01/12/2016   Procedure: Left Heart Cath and Coronary Angiography;  Surgeon: Wellington Hampshire, MD;  Location: [place] CV LAB;  Service: Cardiovascular;  Laterality: N/A;  . CHOLECYSTECTOMY    . ESOPHAGOGASTRODUODENOSCOPY N/A 04/04/2015   Procedure: ESOPHAGOGASTRODUODENOSCOPY (EGD);  Surgeon: Hulen Luster, MD;  Location: [facility] ENDOSCOPY;  Service: Endoscopy;  Laterality: N/A;  . ESOPHAGOGASTRODUODENOSCOPY (EGD) WITH PROPOFOL N/A 01/18/2016   Procedure: ESOPHAGOGASTRODUODENOSCOPY (EGD) WITH PROPOFOL;  Surgeon: Lucilla Lame, MD;  Location: [facility] ENDOSCOPY;  Service: Endoscopy;  Laterality: N/A;  . FLEXIBLE SIGMOIDOSCOPY N/A 01/18/2016   Procedure: FLEXIBLE SIGMOIDOSCOPY;  Surgeon: Lucilla Lame, MD;  Location: [facility] ENDOSCOPY;  Service: Endoscopy;  Laterality: N/A;  . HERNIA REPAIR      Prior to Admission medications   Medication Sig Start Date End Date Taking? Authorizing Provider  acidophilus (RISAQUAD) CAPS capsule Take 2 capsules by mouth daily. 01/25/16  Yes Sital Mody, MD  albuterol (PROVENTIL HFA;VENTOLIN HFA) 108 (90 Base) MCG/ACT inhaler Inhale 2 puffs into the lungs every 6 (six) hours as needed for wheezing or shortness of breath. 05/15/16  Yes Epifanio Lesches, MD  apixaban (ELIQUIS) 5 MG TABS tablet Take 1 tablet (5 mg total) by mouth 2 (two) times daily. 05/15/16  Yes Epifanio Lesches, MD  atorvastatin (LIPITOR) 40 MG tablet Take 1 tablet (40 mg total) by mouth at bedtime. 05/15/16  Yes Epifanio Lesches, MD  bismuth subsalicylate (PEPTO BISMOL) 262 MG/15ML suspension Take 30 mLs by mouth every 4 (four) hours as needed for diarrhea or loose stools. 01/25/16  Yes Bettey Costa, MD  budesonide-formoterol (SYMBICORT) 160-4.5 MCG/ACT inhaler Inhale 2 puffs into the lungs 2 (two) times daily. 05/15/16  Yes Epifanio Lesches, MD  dicyclomine (BENTYL) 10 MG capsule Take 1 capsule (10 mg total) by mouth 4 (four) times daily -  before meals and at bedtime. 05/15/16  Yes Epifanio Lesches, MD  diphenhydrAMINE (BENADRYL) 25 mg capsule Take 2 capsules (50 mg total) by mouth every 6 (six) hours as needed. Patient taking differently: Take 50 mg by mouth every 6 (six) hours as needed for itching or allergies.  02/09/16  Yes Carrie Mew, MD  feeding supplement, GLUCERNA SHAKE, (GLUCERNA SHAKE) LIQD Take 237 mLs by mouth 3 (three) times daily between meals. 05/15/16  Yes Epifanio Lesches, MD  insulin aspart (NOVOLOG) 100 UNIT/ML injection Inject 5 Units into the skin 3 (three) times daily with meals as needed for high blood sugar. Pt uses as needed per sliding scale. 05/15/16  Yes Epifanio Lesches, MD  insulin detemir (LEVEMIR) 100 UNIT/ML injection Inject 0.05 mLs (5 Units total) into the skin at bedtime. Patient taking differently: Inject 17 Units into the skin at bedtime.  05/15/16  Yes Epifanio Lesches, MD  levothyroxine (SYNTHROID, LEVOTHROID) 75 MCG tablet Take 1 tablet (75 mcg total) by mouth daily before breakfast. 05/15/16  Yes Epifanio Lesches, MD  metoCLOPramide (REGLAN) 10 MG tablet Take 1 tablet (10 mg total) by mouth 4 (four) times daily -  before meals and at bedtime. 05/15/16  Yes Epifanio Lesches, MD  morphine (  MS CONTIN) 15 MG 12 hr tablet Take 1 tablet (15 mg total) by mouth every 8 (eight) hours. 05/15/16  Yes Epifanio Lesches, MD  ondansetron (ZOFRAN ODT) 4 MG disintegrating tablet Take 1 tablet (4 mg total) by mouth every 8 (eight) hours as needed for nausea or vomiting. 05/15/16  Yes Epifanio Lesches, MD  oxyCODONE (OXY IR/ROXICODONE) 5 MG immediate release tablet Take 1 tablet (5 mg total) by mouth every 6 (six) hours as needed for severe pain. 05/15/16  Yes Epifanio Lesches, MD  pantoprazole (PROTONIX) 40 MG tablet Take 1 tablet (40 mg total) by mouth 2 (two) times daily before a meal. 05/15/16  Yes Epifanio Lesches, MD  rifaximin (XIFAXAN) 550 MG TABS tablet Take 1 tablet (550 mg total) by mouth 2 (two) times daily. 05/15/16  Yes Epifanio Lesches,  MD  spironolactone (ALDACTONE) 25 MG tablet Take 1 tablet (25 mg total) by mouth daily. 05/15/16  Yes Epifanio Lesches, MD  sucralfate (CARAFATE) 1 GM/10ML suspension Take 10 mLs (1 g total) by mouth 4 (four) times daily -  with meals and at bedtime. 05/15/16  Yes Epifanio Lesches, MD  tiZANidine (ZANAFLEX) 4 MG tablet Take 1 tablet (4 mg total) by mouth 3 (three) times daily as needed for muscle spasms. 05/15/16  Yes Epifanio Lesches, MD    Allergies Hydrocodone; Aspirin; Erythromycin; Prednisone; Rosiglitazone maleate; Codeine sulfate; and Tetanus-diphtheria toxoids td  Family History  Problem Relation Age of Onset  . Hypertension Mother   . CAD Sister   . Heart attack Sister     Deceased 2014-12-04  . CAD Brother     Social History Social History  Substance Use Topics  . Smoking status: Never Smoker  . Smokeless tobacco: Never Used  . Alcohol use No    Review of Systems  Constitutional: No fever/chills. Eyes: No visual changes. ENT: No sore throat. Cardiovascular: Denies chest pain. Respiratory: Denies shortness of breath. Gastrointestinal: Positive for abdominal pain, nausea, vomiting and diarrhea.  No constipation. Genitourinary: Negative for dysuria. Musculoskeletal: Negative for back pain. Skin: Negative for rash. Neurological: Negative for headaches, focal weakness or numbness.  10-point ROS otherwise negative.  ____________________________________________   PHYSICAL EXAM:  VITAL SIGNS: ED Triage Vitals  Enc Vitals Group     BP 06/01/16 12/04/13 (!) 213/103     Pulse Rate 06/01/16 2215 (!) 136     Resp 06/01/16 2215 20     Temp 06/01/16 2215 98.2 F (36.8 C)     Temp Source 06/01/16 12/04/2213 Oral     SpO2 06/01/16 2213 95 %     Weight 06/01/16 2217 130 lb (59 kg)     Height 06/01/16 12/05/2215 5' 3"  (1.6 m)     Head Circumference --      Peak Flow --      Pain Score 06/01/16 12/05/15 10     Pain Loc --      Pain Edu? --      Excl. in [place]? --     Constitutional:  Alert and oriented. Well appearing and in mild acute distress. Eyes: Conjunctivae are normal. PERRL. EOMI. Head: Atraumatic. Nose: No congestion/rhinnorhea. Mouth/Throat: Mucous membranes are moist.  Oropharynx non-erythematous. Neck: No stridor.   Cardiovascular: Normal rate, regular rhythm. Grossly normal heart sounds.  Good peripheral circulation. Respiratory: Normal respiratory effort.  No retractions. Lungs CTAB. Gastrointestinal: Soft and mildly tender to palpation epigastrium without rebound or guarding. No distention. No abdominal bruits. No CVA tenderness. Musculoskeletal: No lower extremity tenderness nor edema.  No joint effusions. Neurologic:  Normal speech and language. No gross focal neurologic deficits are appreciated.  Skin:  Skin is warm, dry and intact. No rash noted. Psychiatric: Mood and affect are normal. Speech and behavior are normal.  ____________________________________________   LABS (all labs ordered are listed, but only abnormal results are displayed)  Labs Reviewed  CBC WITH DIFFERENTIAL/PLATELET - Abnormal; Notable for the following:       Result Value   Hemoglobin 11.1 (*)    HCT 34.6 (*)    MCV 78.3 (*)    MCH 25.3 (*)    RDW 18.4 (*)    Platelets 148 (*)    Neutro Abs 7.5 (*)    Lymphs Abs 0.3 (*)    All other components within normal limits  COMPREHENSIVE METABOLIC PANEL - Abnormal; Notable for the following:    CO2 19 (*)    Glucose, Bld 395 (*)    AST 80 (*)    Total Bilirubin 1.6 (*)    All other components within normal limits  URINALYSIS COMPLETEWITH MICROSCOPIC ([facility] ONLY) - Abnormal; Notable for the following:    Color, Urine YELLOW (*)    APPearance CLEAR (*)    Glucose, UA >500 (*)    Ketones, ur 1+ (*)    Hgb urine dipstick 3+ (*)    Protein, ur 30 (*)    Bacteria, UA RARE (*)    Squamous Epithelial / LPF 0-5 (*)    All other components within normal limits  BLOOD GAS, VENOUS - Abnormal; Notable for the following:    pCO2,  Ven 33 (*)    pO2, Ven 48.0 (*)    Bicarbonate 18.6 (*)    Acid-base deficit 6.1 (*)    All other components within normal limits  LIPASE, BLOOD  TROPONIN I   ____________________________________________  EKG  ED ECG REPORT I, SUNG,JADE J, the attending physician, personally viewed and interpreted this ECG.   Date: 06/02/2016  EKG Time: 2217  Rate: 139  Rhythm: sinus tachycardia  Axis: Normal  Intervals:none  ST&T Change: Nonspecific  ____________________________________________  RADIOLOGY  None ____________________________________________   PROCEDURES  Procedure(s) performed: None  Procedures  Critical Care performed: No  ____________________________________________   INITIAL IMPRESSION / ASSESSMENT AND PLAN / ED COURSE  Pertinent labs & imaging results that were available during my care of the patient were reviewed by me and considered in my medical decision making (see chart for details).  60 year old female with a history of gastroparesis who presents with nausea, vomiting, diarrhea along with epigastric discomfort. Similar symptoms frequently; last hospitalization 1 month ago for gastroparesis. Last CT scan in July. Patient also has a history of long QT syndrome and her QTC is 571. We'll be careful and judicious and selecting choice of analgesia and antiemetic.  Clinical Course  Comment By Time  Patient continues to vomit and complain of epigastric discomfort despite analgesia and antiemetic. Will give 1 dose of Reglan (patient states she is taking this at home) with Dilaudid and discuss with hospitalist to evaluate patient in the emergency department for admission. Paulette Blanch, MD 09/23 0216     ____________________________________________   FINAL CLINICAL IMPRESSION(S) / ED DIAGNOSES  Final diagnoses:  Gastroparesis  Tachycardia  Essential hypertension  Epigastric pain      NEW MEDICATIONS STARTED DURING THIS VISIT:  New Prescriptions    No medications on file     Note:  This document was prepared using Dragon voice recognition software and may include  unintentional dictation errors.    Paulette Blanch, MD 06/02/16 [id number]

## 2016-06-01 NOTE — ED Triage Notes (Signed)
Pt arrived to er via ems for c/o nausea and vomiting for the last 3-4 days - pt reports vomiting 14-15 times today - ems gave zofran 85m IM - abd is tender to palpation - 10/10 pain

## 2016-06-01 NOTE — ED Notes (Addendum)
This nurse Attempted IV x1 in left AC and x1 in right wrist - Sarah RN attempting IV - 1st attempt unsuccessful in left hand 2nd attempt in right [facility] - charge nurse Sharyn Lull notified

## 2016-06-01 NOTE — ED Notes (Signed)
Pt arrived to er via ems for c/o nausea and vomiting for the last 3-4 days - pt reports vomiting 14-15 times today - ems gave zofran 67m IM - abd is tender to palpation - 10/10 pain

## 2016-06-02 DIAGNOSIS — E1143 Type 2 diabetes mellitus with diabetic autonomic (poly)neuropathy: Secondary | ICD-10-CM | POA: Diagnosis not present

## 2016-06-02 DIAGNOSIS — I10 Essential (primary) hypertension: Secondary | ICD-10-CM | POA: Diagnosis not present

## 2016-06-02 DIAGNOSIS — I4891 Unspecified atrial fibrillation: Secondary | ICD-10-CM | POA: Diagnosis not present

## 2016-06-02 DIAGNOSIS — R112 Nausea with vomiting, unspecified: Secondary | ICD-10-CM | POA: Diagnosis not present

## 2016-06-02 LAB — COMPREHENSIVE METABOLIC PANEL
ALT: 54 U/L (ref 14–54)
ANION GAP: 14 (ref 5–15)
AST: 80 U/L — ABNORMAL HIGH (ref 15–41)
Albumin: 4 g/dL (ref 3.5–5.0)
Alkaline Phosphatase: 122 U/L (ref 38–126)
BUN: 20 mg/dL (ref 6–20)
CHLORIDE: 104 mmol/L (ref 101–111)
CO2: 19 mmol/L — AB (ref 22–32)
Calcium: 9.2 mg/dL (ref 8.9–10.3)
Creatinine, Ser: 0.97 mg/dL (ref 0.44–1.00)
Glucose, Bld: 395 mg/dL — ABNORMAL HIGH (ref 65–99)
Potassium: 3.7 mmol/L (ref 3.5–5.1)
Sodium: 137 mmol/L (ref 135–145)
Total Bilirubin: 1.6 mg/dL — ABNORMAL HIGH (ref 0.3–1.2)
Total Protein: 7.6 g/dL (ref 6.5–8.1)

## 2016-06-02 LAB — TSH: TSH: 3.087 u[IU]/mL (ref 0.350–4.500)

## 2016-06-02 LAB — GLUCOSE, CAPILLARY
Glucose-Capillary: 369 mg/dL — ABNORMAL HIGH (ref 65–99)
Glucose-Capillary: 206 mg/dL — ABNORMAL HIGH (ref 65–99)
Glucose-Capillary: 226 mg/dL — ABNORMAL HIGH (ref 65–99)
Glucose-Capillary: 291 mg/dL — ABNORMAL HIGH (ref 65–99)

## 2016-06-02 LAB — BLOOD GAS, VENOUS
ACID-BASE DEFICIT: 6.1 mmol/L — AB (ref 0.0–2.0)
Bicarbonate: 18.6 mmol/L — ABNORMAL LOW (ref 20.0–28.0)
O2 SAT: 81.6 %
PATIENT TEMPERATURE: 37
PCO2 VEN: 33 mmHg — AB (ref 44.0–60.0)
PO2 VEN: 48 mmHg — AB (ref 32.0–45.0)
pH, Ven: 7.36 (ref 7.250–7.430)

## 2016-06-02 LAB — URINALYSIS COMPLETE WITH MICROSCOPIC (ARMC ONLY)
BILIRUBIN URINE: NEGATIVE
LEUKOCYTES UA: NEGATIVE
Nitrite: NEGATIVE
PH: 5 (ref 5.0–8.0)
Protein, ur: 30 mg/dL — AB
Specific Gravity, Urine: 1.026 (ref 1.005–1.030)

## 2016-06-02 LAB — CBC WITH DIFFERENTIAL/PLATELET
Basophils Absolute: 0.1 10*3/uL (ref 0–0.1)
Basophils Relative: 1 %
EOS ABS: 0 10*3/uL (ref 0–0.7)
EOS PCT: 0 %
HCT: 34.6 % — ABNORMAL LOW (ref 35.0–47.0)
Hemoglobin: 11.1 g/dL — ABNORMAL LOW (ref 12.0–16.0)
LYMPHS ABS: 0.3 10*3/uL — AB (ref 1.0–3.6)
LYMPHS PCT: 4 %
MCH: 25.3 pg — AB (ref 26.0–34.0)
MCHC: 32.2 g/dL (ref 32.0–36.0)
MCV: 78.3 fL — AB (ref 80.0–100.0)
MONO ABS: 0.3 10*3/uL (ref 0.2–0.9)
MONOS PCT: 3 %
Neutro Abs: 7.5 10*3/uL — ABNORMAL HIGH (ref 1.4–6.5)
Neutrophils Relative %: 92 %
PLATELETS: 148 10*3/uL — AB (ref 150–440)
RBC: 4.41 MIL/uL (ref 3.80–5.20)
RDW: 18.4 % — AB (ref 11.5–14.5)
WBC: 8.1 10*3/uL (ref 3.6–11.0)

## 2016-06-02 LAB — TROPONIN I

## 2016-06-02 LAB — LIPASE, BLOOD: LIPASE: 28 U/L (ref 11–51)

## 2016-06-02 MED ORDER — METOCLOPRAMIDE HCL 5 MG/ML IJ SOLN
5.0000 mg | Freq: Once | INTRAMUSCULAR | Status: AC
  Administered 2016-06-02: 5 mg via INTRAVENOUS
  Filled 2016-06-02: qty 2

## 2016-06-02 MED ORDER — INSULIN ASPART 100 UNIT/ML ~~LOC~~ SOLN
0.0000 [IU] | Freq: Three times a day (TID) | SUBCUTANEOUS | Status: DC
Start: 2016-06-02 — End: 2016-06-03
  Administered 2016-06-02: 3 [IU] via SUBCUTANEOUS
  Administered 2016-06-02: 9 [IU] via SUBCUTANEOUS
  Administered 2016-06-02: 3 [IU] via SUBCUTANEOUS
  Filled 2016-06-02 (×2): qty 3
  Filled 2016-06-02: qty 9

## 2016-06-02 MED ORDER — ACETAMINOPHEN 650 MG RE SUPP: 650.0000 mg | Freq: Four times a day (QID) | RECTAL | Status: DC | PRN

## 2016-06-02 MED ORDER — RIFAXIMIN 550 MG PO TABS
550.0000 mg | ORAL_TABLET | Freq: Two times a day (BID) | ORAL | Status: DC
Start: 2016-06-02 — End: 2016-06-06
  Administered 2016-06-02 – 2016-06-06 (×9): 550 mg via ORAL
  Filled 2016-06-02 (×9): qty 1

## 2016-06-02 MED ORDER — ONDANSETRON HCL 4 MG PO TABS
4.0000 mg | ORAL_TABLET | Freq: Four times a day (QID) | ORAL | Status: DC | PRN
  Administered 2016-06-03: 4 mg via ORAL
  Filled 2016-06-02: qty 1

## 2016-06-02 MED ORDER — DIPHENHYDRAMINE HCL 25 MG PO CAPS: 50.0000 mg | ORAL_CAPSULE | Freq: Four times a day (QID) | ORAL | Status: DC | PRN

## 2016-06-02 MED ORDER — PANTOPRAZOLE SODIUM 40 MG PO TBEC
40.0000 mg | DELAYED_RELEASE_TABLET | Freq: Two times a day (BID) | ORAL | Status: DC
  Administered 2016-06-02 – 2016-06-06 (×9): 40 mg via ORAL
  Filled 2016-06-02 (×9): qty 1

## 2016-06-02 MED ORDER — ATORVASTATIN CALCIUM 20 MG PO TABS
40.0000 mg | ORAL_TABLET | Freq: Every day | ORAL | Status: DC
  Administered 2016-06-02 – 2016-06-05 (×4): 40 mg via ORAL
  Filled 2016-06-02 (×4): qty 2

## 2016-06-02 MED ORDER — GLUCERNA SHAKE PO LIQD
237.0000 mL | Freq: Three times a day (TID) | ORAL | Status: DC
  Administered 2016-06-02 – 2016-06-03 (×3): 237 mL via ORAL

## 2016-06-02 MED ORDER — BISMUTH SUBSALICYLATE 262 MG/15ML PO SUSP
30.0000 mL | ORAL | Status: DC | PRN
  Filled 2016-06-02: qty 118

## 2016-06-02 MED ORDER — SUCRALFATE 1 GM/10ML PO SUSP
1.0000 g | Freq: Three times a day (TID) | ORAL | Status: DC
  Administered 2016-06-02 – 2016-06-06 (×18): 1 g via ORAL
  Filled 2016-06-02 (×38): qty 10

## 2016-06-02 MED ORDER — LABETALOL HCL 5 MG/ML IV SOLN
5.0000 mg | INTRAVENOUS | Status: DC | PRN
  Administered 2016-06-02: 10 mg via INTRAVENOUS

## 2016-06-02 MED ORDER — LABETALOL HCL 5 MG/ML IV SOLN
INTRAVENOUS | Status: AC
  Filled 2016-06-02: qty 4

## 2016-06-02 MED ORDER — MOMETASONE FURO-FORMOTEROL FUM 200-5 MCG/ACT IN AERO
2.0000 | INHALATION_SPRAY | Freq: Two times a day (BID) | RESPIRATORY_TRACT | Status: DC
  Administered 2016-06-02 – 2016-06-06 (×9): 2 via RESPIRATORY_TRACT
  Filled 2016-06-02: qty 8.8

## 2016-06-02 MED ORDER — APIXABAN 5 MG PO TABS
5.0000 mg | ORAL_TABLET | Freq: Two times a day (BID) | ORAL | Status: DC
  Administered 2016-06-02 – 2016-06-06 (×9): 5 mg via ORAL
  Filled 2016-06-02 (×9): qty 1

## 2016-06-02 MED ORDER — SODIUM CHLORIDE 0.9 % IV BOLUS (SEPSIS)
1000.0000 mL | Freq: Once | INTRAVENOUS | Status: AC
  Administered 2016-06-02: 1000 mL via INTRAVENOUS

## 2016-06-02 MED ORDER — ACETAMINOPHEN 325 MG PO TABS: 650.0000 mg | ORAL_TABLET | Freq: Four times a day (QID) | ORAL | Status: DC | PRN

## 2016-06-02 MED ORDER — RISAQUAD PO CAPS
2.0000 | ORAL_CAPSULE | Freq: Every day | ORAL | Status: DC
  Administered 2016-06-02 – 2016-06-06 (×5): 2 via ORAL
  Filled 2016-06-02 (×5): qty 2

## 2016-06-02 MED ORDER — MORPHINE SULFATE ER 15 MG PO TBCR
15.0000 mg | EXTENDED_RELEASE_TABLET | Freq: Three times a day (TID) | ORAL | Status: DC
  Administered 2016-06-02 – 2016-06-05 (×8): 15 mg via ORAL
  Filled 2016-06-02 (×8): qty 1

## 2016-06-02 MED ORDER — METOCLOPRAMIDE HCL 10 MG PO TABS
10.0000 mg | ORAL_TABLET | Freq: Three times a day (TID) | ORAL | Status: DC
  Administered 2016-06-02 – 2016-06-06 (×18): 10 mg via ORAL
  Filled 2016-06-02 (×19): qty 1

## 2016-06-02 MED ORDER — INSULIN DETEMIR 100 UNIT/ML ~~LOC~~ SOLN
5.0000 [IU] | Freq: Every day | SUBCUTANEOUS | Status: DC
  Administered 2016-06-02 – 2016-06-03 (×2): 5 [IU] via SUBCUTANEOUS
  Filled 2016-06-02 (×3): qty 0.05

## 2016-06-02 MED ORDER — INFLUENZA VAC SPLIT QUAD 0.5 ML IM SUSY
0.5000 mL | PREFILLED_SYRINGE | INTRAMUSCULAR | Status: AC
  Administered 2016-06-03: 0.5 mL via INTRAMUSCULAR
  Filled 2016-06-02: qty 0.5

## 2016-06-02 MED ORDER — ALBUTEROL SULFATE (2.5 MG/3ML) 0.083% IN NEBU: 3.0000 mL | INHALATION_SOLUTION | Freq: Four times a day (QID) | RESPIRATORY_TRACT | Status: DC | PRN

## 2016-06-02 MED ORDER — DICYCLOMINE HCL 10 MG PO CAPS
10.0000 mg | ORAL_CAPSULE | Freq: Three times a day (TID) | ORAL | Status: DC
  Administered 2016-06-02 – 2016-06-06 (×17): 10 mg via ORAL
  Filled 2016-06-02 (×17): qty 1

## 2016-06-02 MED ORDER — HYDRALAZINE HCL 20 MG/ML IJ SOLN
10.0000 mg | INTRAMUSCULAR | Status: DC | PRN
Start: 2016-06-02 — End: 2016-06-03

## 2016-06-02 MED ORDER — LABETALOL HCL 5 MG/ML IV SOLN
5.0000 mg | INTRAVENOUS | Status: DC | PRN
  Administered 2016-06-02: 10 mg via INTRAVENOUS
  Filled 2016-06-02: qty 4

## 2016-06-02 MED ORDER — OXYCODONE HCL 5 MG PO TABS
5.0000 mg | ORAL_TABLET | Freq: Four times a day (QID) | ORAL | Status: DC | PRN
  Administered 2016-06-03 – 2016-06-04 (×2): 5 mg via ORAL
  Filled 2016-06-02 (×2): qty 1

## 2016-06-02 MED ORDER — HYDROMORPHONE HCL 1 MG/ML IJ SOLN
1.0000 mg | Freq: Once | INTRAMUSCULAR | Status: AC
  Administered 2016-06-02: 1 mg via INTRAVENOUS
  Filled 2016-06-02: qty 1

## 2016-06-02 MED ORDER — MORPHINE SULFATE (PF) 2 MG/ML IV SOLN
2.0000 mg | INTRAVENOUS | Status: DC | PRN
  Administered 2016-06-02 – 2016-06-05 (×11): 2 mg via INTRAVENOUS
  Filled 2016-06-02 (×11): qty 1

## 2016-06-02 MED ORDER — PROCHLORPERAZINE EDISYLATE 5 MG/ML IJ SOLN
10.0000 mg | INTRAMUSCULAR | Status: DC | PRN
  Administered 2016-06-02 (×2): 10 mg via INTRAVENOUS
  Filled 2016-06-02 (×2): qty 2

## 2016-06-02 MED ORDER — LEVOTHYROXINE SODIUM 75 MCG PO TABS
75.0000 ug | ORAL_TABLET | Freq: Every day | ORAL | Status: DC
  Administered 2016-06-02 – 2016-06-06 (×5): 75 ug via ORAL
  Filled 2016-06-02 (×5): qty 1

## 2016-06-02 MED ORDER — MORPHINE SULFATE (PF) 4 MG/ML IV SOLN
4.0000 mg | Freq: Once | INTRAVENOUS | Status: AC
Start: 2016-06-02 — End: 2016-06-02
  Administered 2016-06-02: 4 mg via INTRAVENOUS
  Filled 2016-06-02: qty 1

## 2016-06-02 MED ORDER — SPIRONOLACTONE 25 MG PO TABS
25.0000 mg | ORAL_TABLET | Freq: Every day | ORAL | Status: DC
  Administered 2016-06-02 – 2016-06-06 (×5): 25 mg via ORAL
  Filled 2016-06-02 (×5): qty 1

## 2016-06-02 MED ORDER — ONDANSETRON HCL 4 MG/2ML IJ SOLN
4.0000 mg | Freq: Four times a day (QID) | INTRAMUSCULAR | Status: DC | PRN
  Administered 2016-06-02 – 2016-06-05 (×4): 4 mg via INTRAVENOUS
  Filled 2016-06-02 (×4): qty 2

## 2016-06-02 MED ORDER — SODIUM CHLORIDE 0.9 % IV SOLN
INTRAVENOUS | Status: DC
  Administered 2016-06-02 – 2016-06-05 (×8): via INTRAVENOUS

## 2016-06-02 MED ORDER — DOCUSATE SODIUM 100 MG PO CAPS
100.0000 mg | ORAL_CAPSULE | Freq: Two times a day (BID) | ORAL | Status: DC
  Administered 2016-06-02 – 2016-06-04 (×4): 100 mg via ORAL
  Filled 2016-06-02 (×4): qty 1

## 2016-06-02 MED ORDER — TIZANIDINE HCL 2 MG PO TABS: 4.0000 mg | ORAL_TABLET | Freq: Three times a day (TID) | ORAL | Status: DC | PRN

## 2016-06-02 NOTE — H&P (Signed)
[name] is an 60 y.o. female.   Chief Complaint: Nausea and vomiting HPI: The patient with past medical history of nonalcoholic cirrhosis of the liver and gastroparesis presents emergency department complaining of intractable nausea and vomiting for the last few days. She did not respond to multiple doses of antiemetics which prompted emergency department staff to call the hospitalist service for admission.  Past Medical History:  Diagnosis Date  . Allergy   . Anxiety   . Cancer (HCC)    HX OF CANCER OF UTERUS   . Cirrhosis of liver not due to alcohol ([place]) 2014/11/09  . Degenerative disk disease   . Diverticulitis   . Gastroparesis   . GERD (gastroesophageal reflux disease)   . History of hiatal hernia   . Hypertension   . Hypothyroid   . Intussusception intestine ([place]) 05/2015  . PAF (paroxysmal atrial fibrillation) ([place]) 03/2015   a. new onset 03/2015 in setting of intractable N/V; b. on Eliquis 5 mg bid; c. CHADSVASc 4 (DM, TIA x 2, female)  . Pancreatitis   . Sick sinus syndrome ([place])   . Stomach ulcer   . Stroke [facility])    with minimal left sided weakness  . Syncope 01/2015  . TIA (transient ischemic attack) 02/2015  . Type 1 diabetes ([place])    on levemir    Past Surgical History:  Procedure Laterality Date  . ABDOMINAL HYSTERECTOMY    . CARDIAC CATHETERIZATION N/A 01/12/2016   Procedure: Left Heart Cath and Coronary Angiography;  Surgeon: Wellington Hampshire, MD;  Location: [place] CV LAB;  Service: Cardiovascular;  Laterality: N/A;  . CHOLECYSTECTOMY    . ESOPHAGOGASTRODUODENOSCOPY N/A 04/04/2015   Procedure: ESOPHAGOGASTRODUODENOSCOPY (EGD);  Surgeon: Hulen Luster, MD;  Location: [facility] ENDOSCOPY;  Service: Endoscopy;  Laterality: N/A;  . ESOPHAGOGASTRODUODENOSCOPY (EGD) WITH PROPOFOL N/A 01/18/2016   Procedure: ESOPHAGOGASTRODUODENOSCOPY (EGD) WITH PROPOFOL;  Surgeon: Lucilla Lame, MD;  Location: [facility] ENDOSCOPY;  Service: Endoscopy;  Laterality: N/A;  . FLEXIBLE SIGMOIDOSCOPY  N/A 01/18/2016   Procedure: FLEXIBLE SIGMOIDOSCOPY;  Surgeon: Lucilla Lame, MD;  Location: [facility] ENDOSCOPY;  Service: Endoscopy;  Laterality: N/A;  . HERNIA REPAIR      Family History  Problem Relation Age of Onset  . Hypertension Mother   . CAD Sister   . Heart attack Sister     Deceased 2014-11-09  . CAD Brother    Social History:  reports that she has never smoked. She has never used smokeless tobacco. She reports that she does not drink alcohol or use drugs.  Allergies:  Allergies  Allergen Reactions  . Hydrocodone Other (See Comments)    Pt states that this medication caused cirrhosis of the liver.    . Aspirin   . Erythromycin Other (See Comments)    Reaction:  Fever   . Prednisone Other (See Comments)    Reaction:  Unknown   . Rosiglitazone Maleate Swelling  . Codeine Sulfate Rash  . Tetanus-Diphtheria Toxoids Td Rash and Other (See Comments)    Reaction:  Fever      (Not in a hospital admission)  Results for orders placed or performed during the hospital encounter of 06/01/16 (from the past 48 hour(s))  CBC with Differential     Status: Abnormal   Collection Time: 06/01/16 11:26 PM  Result Value Ref Range   WBC 8.1 3.6 - 11.0 K/uL   RBC 4.41 3.80 - 5.20 MIL/uL   Hemoglobin 11.1 (L) 12.0 - 16.0 g/dL   HCT 34.6 (  L) 35.0 - 47.0 %   MCV 78.3 (L) 80.0 - 100.0 fL   MCH 25.3 (L) 26.0 - 34.0 pg   MCHC 32.2 32.0 - 36.0 g/dL   RDW 18.4 (H) 11.5 - 14.5 %   Platelets 148 (L) 150 - 440 K/uL   Neutrophils Relative % 92 %   Neutro Abs 7.5 (H) 1.4 - 6.5 K/uL   Lymphocytes Relative 4 %   Lymphs Abs 0.3 (L) 1.0 - 3.6 K/uL   Monocytes Relative 3 %   Monocytes Absolute 0.3 0.2 - 0.9 K/uL   Eosinophils Relative 0 %   Eosinophils Absolute 0.0 0 - 0.7 K/uL   Basophils Relative 1 %   Basophils Absolute 0.1 0 - 0.1 K/uL  Comprehensive metabolic panel     Status: Abnormal   Collection Time: 06/01/16 11:26 PM  Result Value Ref Range   Sodium 137 135 - 145 mmol/L   Potassium 3.7 3.5  - 5.1 mmol/L   Chloride 104 101 - 111 mmol/L   CO2 19 (L) 22 - 32 mmol/L   Glucose, Bld 395 (H) 65 - 99 mg/dL   BUN 20 6 - 20 mg/dL   Creatinine, Ser 0.97 0.44 - 1.00 mg/dL   Calcium 9.2 8.9 - 10.3 mg/dL   Total Protein 7.6 6.5 - 8.1 g/dL   Albumin 4.0 3.5 - 5.0 g/dL   AST 80 (H) 15 - 41 U/L   ALT 54 14 - 54 U/L   Alkaline Phosphatase 122 38 - 126 U/L   Total Bilirubin 1.6 (H) 0.3 - 1.2 mg/dL   GFR calc non Af Amer >60 >60 mL/min   GFR calc Af Amer >60 >60 mL/min    Comment: (NOTE) The eGFR has been calculated using the CKD EPI equation. This calculation has not been validated in all clinical situations. eGFR's persistently <60 mL/min signify possible Chronic Kidney Disease.    Anion gap 14 5 - 15  Lipase, blood     Status: None   Collection Time: 06/01/16 11:26 PM  Result Value Ref Range   Lipase 28 11 - 51 U/L  Troponin I     Status: None   Collection Time: 06/01/16 11:26 PM  Result Value Ref Range   Troponin I <0.03 <0.03 ng/mL  Blood gas, venous     Status: Abnormal   Collection Time: 06/02/16  1:12 AM  Result Value Ref Range   pH, Ven 7.36 7.250 - 7.430   pCO2, Ven 33 (L) 44.0 - 60.0 mmHg   pO2, Ven 48.0 (H) 32.0 - 45.0 mmHg   Bicarbonate 18.6 (L) 20.0 - 28.0 mmol/L   Acid-base deficit 6.1 (H) 0.0 - 2.0 mmol/L   O2 Saturation 81.6 %   Patient temperature 37.0    Collection site VENOUS    Sample type VENOUS   Urinalysis complete, with microscopic ([facility] only)     Status: Abnormal   Collection Time: 06/02/16  1:14 AM  Result Value Ref Range   Color, Urine YELLOW (A) YELLOW   APPearance CLEAR (A) CLEAR   Glucose, UA >500 (A) NEGATIVE mg/dL   Bilirubin Urine NEGATIVE NEGATIVE   Ketones, ur 1+ (A) NEGATIVE mg/dL   Specific Gravity, Urine 1.026 1.005 - 1.030   Hgb urine dipstick 3+ (A) NEGATIVE   pH 5.0 5.0 - 8.0   Protein, ur 30 (A) NEGATIVE mg/dL   Nitrite NEGATIVE NEGATIVE   Leukocytes, UA NEGATIVE NEGATIVE   RBC / HPF TOO NUMEROUS TO COUNT 0 - 5  RBC/hpf    WBC, UA 0-5 0 - 5 WBC/hpf   Bacteria, UA RARE (A) NONE SEEN   Squamous Epithelial / LPF 0-5 (A) NONE SEEN   Mucous PRESENT    No results found.  Review of Systems  Constitutional: Negative for chills and fever.  HENT: Negative for sore throat and tinnitus.   Eyes: Negative for blurred vision and redness.  Respiratory: Negative for cough and shortness of breath.   Cardiovascular: Negative for chest pain, palpitations, orthopnea and PND.  Gastrointestinal: Positive for abdominal pain, nausea and vomiting. Negative for diarrhea.  Genitourinary: Negative for dysuria, frequency and urgency.  Musculoskeletal: Negative for joint pain and myalgias.  Skin: Negative for rash.       No lesions  Neurological: Negative for speech change, focal weakness and weakness.  Endo/Heme/Allergies: Does not bruise/bleed easily.       No temperature intolerance  Psychiatric/Behavioral: Negative for depression and suicidal ideas.    Blood pressure (!) 209/92, pulse (!) 111, temperature 98.2 F (36.8 C), temperature source Oral, resp. rate 10, height 5' 3"  (1.6 m), weight 59 kg (130 lb), SpO2 99 %. Physical Exam  Vitals reviewed. Constitutional: She is oriented to person, place, and time. She appears well-developed and well-nourished.  HENT:  Head: Normocephalic and atraumatic.  Mouth/Throat: Oropharynx is clear and moist.  Eyes: Conjunctivae and EOM are normal. Pupils are equal, round, and reactive to light. No scleral icterus.  Neck: Normal range of motion. Neck supple. No JVD present. No tracheal deviation present. No thyromegaly present.  Cardiovascular: Normal rate, regular rhythm and normal heart sounds.  Exam reveals no gallop and no friction rub.   No murmur heard. Respiratory: Effort normal and breath sounds normal.  GI: Soft. Bowel sounds are normal. She exhibits no distension. There is no tenderness.  Genitourinary:  Genitourinary Comments: Deferred  Musculoskeletal: Normal range of motion.  She exhibits no edema.  Lymphadenopathy:    She has no cervical adenopathy.  Neurological: She is alert and oriented to person, place, and time. No cranial nerve deficit. She exhibits normal muscle tone.  Skin: Skin is warm and dry. No rash noted. No erythema.  Psychiatric: She has a normal mood and affect. Her behavior is normal. Judgment and thought content normal.     Assessment/Plan This is a 60 year old female admitted for intractable nausea and vomiting. 1. Intractable nausea and vomiting: Secondary to gastroparesis. Continue Reglan, Bentyl and PPI. Hydrate with intravenous fluid.  Compazine for refractory nausea and vomiting. Manage abdominal pain. 2. Tachycardia: The patient has known history of tachybradycardia syndrome.  3. Essential hypertension: Uncontrolled; likely secondary to repeated vomiting. Continue metoprolol. Labetalol prn. 4. Atrial fibrillation: Paroxysmal; continue Eliquis 5. Prolonged QT interval: This is a chronic issue. The patient is on multiple QT prolonging agents as an outpatient. 6. Diabetes mellitus: Unclear if type I or 2 but we will continue the patient's basal insulin and place on sliding scale. 7. Hyperlipidemia: Continue statin therapy 8. Hypothyroidism: Continue Synthroid 9. COPD: Continue inhaled corticosteroid as well as albuterol as needed. 10. Cirrhosis of liver: Continue rifaximin as well as Lasix and spironolactone per home regimen 11. DVT prophylaxis: Full anticoagulation as above 12. GI prophylaxis: PPI per home regimen The patient is a DO NOT RESUSCITATE. Time spent on admission orders and patient care proximally 45 minutes  Harrie Foreman, MD 06/02/2016, 2:37 AM

## 2016-06-02 NOTE — ED Notes (Signed)
Called report to [facility] - she spoke to her charge nurse and Colletta Maryland the house supervisor concerning pt BP and Pulse - the floor will not accept the pt until the BP and pulse have either been addressed with medication OR the admitting MD puts in ranges that are acceptable for this pt - Dr Marcille Blanco paged

## 2016-06-02 NOTE — ED Notes (Addendum)
Dr Marcille Blanco reviewed BP's and pulses and cleared for floor - report called to [facility]

## 2016-06-02 NOTE — ED Notes (Signed)
Pt assisted to the bathroom - voided without difficulty

## 2016-06-02 NOTE — ED Notes (Signed)
Dr Marcille Blanco returned call - see new orders

## 2016-06-02 NOTE — Progress Notes (Addendum)
[place] at [place] NAME: [name]    MRN#:  [id number]  DATE OF BIRTH:  11-Jul-1956  SUBJECTIVE:  Hospital Day: 0 days [name] is a 60 y.o. female presenting with Nausea .   Overnight events: No overnight events Interval Events: Still having some nausea and abdominal pain  REVIEW OF SYSTEMS:  CONSTITUTIONAL: No fever, fatigue or weakness.  EYES: No blurred or double vision.  EARS, NOSE, AND THROAT: No tinnitus or ear pain.  RESPIRATORY: No cough, shortness of breath, wheezing or hemoptysis.  CARDIOVASCULAR: No chest pain, orthopnea, edema.  GASTROINTESTINAL: Positive nausea, abdominal pain.  GENITOURINARY: No dysuria, hematuria.  ENDOCRINE: No polyuria, nocturia,  HEMATOLOGY: No anemia, easy bruising or bleeding SKIN: No rash or lesion. MUSCULOSKELETAL: No joint pain or arthritis.   NEUROLOGIC: No tingling, numbness, weakness.  PSYCHIATRY: No anxiety or depression.   DRUG ALLERGIES:   Allergies  Allergen Reactions  . Hydrocodone Other (See Comments)    Pt states that this medication caused cirrhosis of the liver.    . Aspirin   . Erythromycin Other (See Comments)    Reaction:  Fever   . Prednisone Other (See Comments)    Reaction:  Unknown   . Rosiglitazone Maleate Swelling  . Codeine Sulfate Rash  . Tetanus-Diphtheria Toxoids Td Rash and Other (See Comments)    Reaction:  Fever     VITALS:  Blood pressure (!) 165/90, pulse 90, temperature 98.5 F (36.9 C), temperature source Oral, resp. rate 16, height 5' 3"  (1.6 m), weight 59 kg (130 lb), SpO2 99 %.  PHYSICAL EXAMINATION:  VITAL SIGNS: Vitals:   06/02/16 0745 06/02/16 0825  BP: (!) 172/97 (!) 165/90  Pulse: (!) 106 90  Resp:    Temp: 98.5 F (36.9 C)    GENERAL:60 y.o.female currently in no acute distress.  HEAD: Normocephalic, atraumatic.  EYES: Pupils equal, round, reactive to light. Extraocular muscles intact. No scleral icterus.  MOUTH:  Moist mucosal membrane. Dentition intact. No abscess noted.  EAR, NOSE, THROAT: Clear without exudates. No external lesions.  NECK: Supple. No thyromegaly. No nodules. No JVD.  PULMONARY: Clear to ascultation, without wheeze rails or rhonci. No use of accessory muscles, Good respiratory effort. good air entry bilaterally CHEST: Nontender to palpation.  CARDIOVASCULAR: S1 and S2. Regular rate and rhythm. No murmurs, rubs, or gallops. No edema. Pedal pulses 2+ bilaterally.  GASTROINTESTINAL: Soft, minimal tenderness, nondistended. No masses. Positive bowel sounds. No hepatosplenomegaly.  MUSCULOSKELETAL: No swelling, clubbing, or edema. Range of motion full in all extremities.  NEUROLOGIC: Cranial nerves II through XII are intact. No gross focal neurological deficits. Sensation intact. Reflexes intact.  SKIN: No ulceration, lesions, rashes, or cyanosis. Skin warm and dry. Turgor intact.  PSYCHIATRIC: Mood, affect within normal limits. The patient is awake, alert and oriented x 3. Insight, judgment intact.      LABORATORY PANEL:   CBC  Recent Labs Lab 06/01/16 2326  WBC 8.1  HGB 11.1*  HCT 34.6*  PLT 148*   ------------------------------------------------------------------------------------------------------------------  Chemistries   Recent Labs Lab 06/01/16 2326  NA 137  K 3.7  CL 104  CO2 19*  GLUCOSE 395*  BUN 20  CREATININE 0.97  CALCIUM 9.2  AST 80*  ALT 54  ALKPHOS 122  BILITOT 1.6*   ------------------------------------------------------------------------------------------------------------------  Cardiac Enzymes  Recent Labs Lab 06/01/16 2326  TROPONINI <0.03   ------------------------------------------------------------------------------------------------------------------  RADIOLOGY:  No results found.  EKG:   Orders placed or  performed during the hospital encounter of 05/11/16  . EKG 12-Lead  . EKG 12-Lead  . EKG 12-Lead  . EKG 12-Lead  .  EKG 12-Lead  . EKG 12-Lead    ASSESSMENT AND PLAN:   [name] is a 60 y.o. female presenting with Nausea . Admitted 06/01/2016 : Day #: 0 days 1. Intractable nausea/vomiting: Secondary to gastroparesis this is a recurrent issue-as suspected upon further questioning she states she ran out of her pain medications and he is yet to see a gastroenterologist but does have an appointment scheduled. Continue with supportive measures advance diet as tolerated anticipate discharge 2 days.  2. Type 2 diabetes insulin requiring: Continue insulin  3. Hyperlipidemia unspecified: Statin therapy 4. Hypothyroidism unspecified: Synthroid   All the records are reviewed and case discussed with Care Management/Social Workerr. Management plans discussed with the patient, family and they are in agreement.  CODE STATUS: full TOTAL TIME TAKING CARE OF THIS PATIENT: 28 minutes.   POSSIBLE D/C IN 1-2DAYS, DEPENDING ON CLINICAL CONDITION.   [name],  Karenann Cai.D on 06/02/2016 at 11:54 AM  Between 7am to 6pm - Pager - [phone]  After 6pm: House Pager: - [phone]  [facility]  Office  [phone]  CC: Primary care physician; No PCP Per Patient

## 2016-06-02 NOTE — Care Management Obs Status (Signed)
[place] NOTIFICATION   Patient Details  Name: [name] MRN: [id number] Date of Birth: Sep 07, 1956   Medicare Observation Status Notification Given:  Yes    Ival Bible, RN 06/02/2016, 5:36 AM

## 2016-06-03 DIAGNOSIS — E1143 Type 2 diabetes mellitus with diabetic autonomic (poly)neuropathy: Secondary | ICD-10-CM | POA: Diagnosis not present

## 2016-06-03 DIAGNOSIS — I4891 Unspecified atrial fibrillation: Secondary | ICD-10-CM | POA: Diagnosis not present

## 2016-06-03 DIAGNOSIS — R112 Nausea with vomiting, unspecified: Secondary | ICD-10-CM | POA: Diagnosis not present

## 2016-06-03 DIAGNOSIS — I10 Essential (primary) hypertension: Secondary | ICD-10-CM | POA: Diagnosis not present

## 2016-06-03 LAB — GLUCOSE, CAPILLARY
Glucose-Capillary: 221 mg/dL — ABNORMAL HIGH (ref 65–99)
Glucose-Capillary: 205 mg/dL — ABNORMAL HIGH (ref 65–99)
Glucose-Capillary: 211 mg/dL — ABNORMAL HIGH (ref 65–99)
Glucose-Capillary: 244 mg/dL — ABNORMAL HIGH (ref 65–99)

## 2016-06-03 MED ORDER — DILTIAZEM HCL 30 MG PO TABS
30.0000 mg | ORAL_TABLET | Freq: Three times a day (TID) | ORAL | Status: DC
  Administered 2016-06-03 – 2016-06-06 (×9): 30 mg via ORAL
  Filled 2016-06-03 (×9): qty 1

## 2016-06-03 MED ORDER — INSULIN ASPART 100 UNIT/ML ~~LOC~~ SOLN
0.0000 [IU] | Freq: Every day | SUBCUTANEOUS | Status: DC
  Administered 2016-06-03 – 2016-06-05 (×2): 2 [IU] via SUBCUTANEOUS
  Filled 2016-06-03 (×2): qty 2

## 2016-06-03 MED ORDER — INSULIN ASPART 100 UNIT/ML ~~LOC~~ SOLN
0.0000 [IU] | Freq: Three times a day (TID) | SUBCUTANEOUS | Status: DC
  Administered 2016-06-03 – 2016-06-04 (×5): 5 [IU] via SUBCUTANEOUS
  Administered 2016-06-04: 3 [IU] via SUBCUTANEOUS
  Administered 2016-06-05: 100 [IU] via SUBCUTANEOUS
  Administered 2016-06-05: 8 [IU] via SUBCUTANEOUS
  Administered 2016-06-05: 3 [IU] via SUBCUTANEOUS
  Administered 2016-06-06 (×2): 5 [IU] via SUBCUTANEOUS
  Filled 2016-06-03 (×2): qty 5
  Filled 2016-06-03: qty 3
  Filled 2016-06-03: qty 5
  Filled 2016-06-03: qty 3
  Filled 2016-06-03 (×3): qty 5
  Filled 2016-06-03: qty 8
  Filled 2016-06-03 (×2): qty 5

## 2016-06-03 MED ORDER — PNEUMOCOCCAL VAC POLYVALENT 25 MCG/0.5ML IJ INJ: 0.5000 mL | INJECTION | INTRAMUSCULAR | Status: DC

## 2016-06-03 MED ORDER — LABETALOL HCL 5 MG/ML IV SOLN: 10.0000 mg | INTRAVENOUS | Status: DC | PRN

## 2016-06-03 MED ORDER — GLUCERNA SHAKE PO LIQD
237.0000 mL | Freq: Three times a day (TID) | ORAL | Status: DC
  Administered 2016-06-03 – 2016-06-06 (×7): 237 mL via ORAL

## 2016-06-03 NOTE — Progress Notes (Signed)
[place] at [place] NAME: [name]    MRN#:  [id number]  DATE OF BIRTH:  1956/02/22  SUBJECTIVE:  Hospital Day: 0 days [name] is a 60 y.o. female presenting with Nausea .   Overnight events: No overnight events Interval Events: Complains of increased nausea and abdominal pain not able to tolerate diet  REVIEW OF SYSTEMS:  CONSTITUTIONAL: No fever, fatigue or weakness.  EYES: No blurred or double vision.  EARS, NOSE, AND THROAT: No tinnitus or ear pain.  RESPIRATORY: No cough, shortness of breath, wheezing or hemoptysis.  CARDIOVASCULAR: No chest pain, orthopnea, edema.  GASTROINTESTINAL: Positive nausea, abdominal pain.  GENITOURINARY: No dysuria, hematuria.  ENDOCRINE: No polyuria, nocturia,  HEMATOLOGY: No anemia, easy bruising or bleeding SKIN: No rash or lesion. MUSCULOSKELETAL: No joint pain or arthritis.   NEUROLOGIC: No tingling, numbness, weakness.  PSYCHIATRY: No anxiety or depression.   DRUG ALLERGIES:   Allergies  Allergen Reactions  . Hydrocodone Other (See Comments)    Pt states that this medication caused cirrhosis of the liver.    . Aspirin   . Erythromycin Other (See Comments)    Reaction:  Fever   . Prednisone Other (See Comments)    Reaction:  Unknown   . Rosiglitazone Maleate Swelling  . Codeine Sulfate Rash  . Tetanus-Diphtheria Toxoids Td Rash and Other (See Comments)    Reaction:  Fever     VITALS:  Blood pressure 122/65, pulse (!) 134, temperature 98.5 F (36.9 C), temperature source Oral, resp. rate 18, height 5' 3"  (1.6 m), weight 59.5 kg (131 lb 3.2 oz), SpO2 97 %.  PHYSICAL EXAMINATION:  VITAL SIGNS: Vitals:   06/02/16 2145 06/03/16 0511  BP: (!) 149/82 122/65  Pulse: (!) 137 (!) 134  Resp: 17 18  Temp: 98.6 F (37 C) 98.5 F (36.9 C)   GENERAL:60 y.o.female currently in no acute distress.  HEAD: Normocephalic, atraumatic.  EYES: Pupils equal, round, reactive to  light. Extraocular muscles intact. No scleral icterus.  MOUTH: Moist mucosal membrane. Dentition intact. No abscess noted.  EAR, NOSE, THROAT: Clear without exudates. No external lesions.  NECK: Supple. No thyromegaly. No nodules. No JVD.  PULMONARY: Clear to ascultation, without wheeze rails or rhonci. No use of accessory muscles, Good respiratory effort. good air entry bilaterally CHEST: Nontender to palpation.  CARDIOVASCULAR: S1 and S2. Regular rate and rhythm. No murmurs, rubs, or gallops. No edema. Pedal pulses 2+ bilaterally.  GASTROINTESTINAL: Soft, minimal tenderness, nondistended. No masses. Positive bowel sounds. No hepatosplenomegaly.  MUSCULOSKELETAL: No swelling, clubbing, or edema. Range of motion full in all extremities.  NEUROLOGIC: Cranial nerves II through XII are intact. No gross focal neurological deficits. Sensation intact. Reflexes intact.  SKIN: No ulceration, lesions, rashes, or cyanosis. Skin warm and dry. Turgor intact.  PSYCHIATRIC: Mood, affect within normal limits. The patient is awake, alert and oriented x 3. Insight, judgment intact.      LABORATORY PANEL:   CBC  Recent Labs Lab 06/01/16 2326  WBC 8.1  HGB 11.1*  HCT 34.6*  PLT 148*   ------------------------------------------------------------------------------------------------------------------  Chemistries   Recent Labs Lab 06/01/16 2326  NA 137  K 3.7  CL 104  CO2 19*  GLUCOSE 395*  BUN 20  CREATININE 0.97  CALCIUM 9.2  AST 80*  ALT 54  ALKPHOS 122  BILITOT 1.6*   ------------------------------------------------------------------------------------------------------------------  Cardiac Enzymes  Recent Labs Lab 06/01/16 2326  TROPONINI <0.03   ------------------------------------------------------------------------------------------------------------------  RADIOLOGY:  No results found.  EKG:   Orders placed or performed during the hospital encounter of 05/11/16  .  EKG 12-Lead  . EKG 12-Lead  . EKG 12-Lead  . EKG 12-Lead  . EKG 12-Lead  . EKG 12-Lead    ASSESSMENT AND PLAN:   Yue Flanigan is a 60 y.o. female presenting with Nausea . Admitted 06/01/2016 : Day #: 0 days 1. Intractable nausea/vomiting: Secondary to gastroparesis this is a recurrent issue-as suspected upon further questioning she states she ran out of her pain medications and he is yet to see a gastroenterologist but does have an appointment scheduled. Continue with supportive measures advance diet as tolerated   2. Type 2 diabetes insulin requiring: Increase insulin  3. Hyperlipidemia unspecified: Statin therapy 4. Hypothyroidism unspecified: Synthroid   All the records are reviewed and case discussed with Care Management/Social Workerr. Management plans discussed with the patient, family and they are in agreement.  CODE STATUS: full TOTAL TIME TAKING CARE OF THIS PATIENT: 33 minutes.   POSSIBLE D/C IN 1-2DAYS, DEPENDING ON CLINICAL CONDITION.   Hower,  Karenann Cai.D on 06/03/2016 at 11:50 AM  Between 7am to 6pm - Pager - [phone]  After 6pm: House Pager: - [phone]  [facility]  Office  [phone]  CC: Primary care physician; No PCP Per Patient

## 2016-06-03 NOTE — Progress Notes (Signed)
Called Dr. Jannifer Franklin regarding patient's heart rate.  Doctor will look over chart and put in appropriate orders.  Christene Slates   06/03/2016 10:50 PM

## 2016-06-03 NOTE — Progress Notes (Signed)
Initial Nutrition Assessment  DOCUMENTATION CODES:   Not applicable  INTERVENTION:  Encourage PO intake Continue Glucerna Shake po TID, each supplement provides 220 kcal and 10 grams of protein  NUTRITION DIAGNOSIS:   Malnutrition related to acute illness as evidenced by moderate depletion of body fat, moderate depletions of muscle mass, energy intake < or equal to 50% for > or equal to 5 days.  GOAL:   Patient will meet greater than or equal to 90% of their needs  MONITOR:   PO intake, I & O's, Labs, Weight trends, Diet advancement  REASON FOR ASSESSMENT:   Malnutrition Screening Tool    ASSESSMENT:   The patient with past medical history of nonalcoholic cirrhosis of the liver and gastroparesis presents emergency department complaining of intractable nausea and vomiting for the last few days.  [name] endorses nausea/vomiting for 5 days PTA with little to no PO intake during that time. She also endorses a 20# weight loss over 5 days as a result. She had a clear liquid tray at bedside she had no consumed very little of. Pt still complains of nausea related to gastroparesis. Wt loss per chart is 5#/3.6% over 5 months. Nutrition-Focused physical exam completed. Findings are moderate fat depletion, moderate muscle depletion, and no edema.  Labs and medications reviewed: CBGs 205-296 Mg 1.6, tot bili 1.6 Reglan, Colace NS @ 141m/hr  Diet Order:  Diet clear liquid Room service appropriate? Yes; Fluid consistency: Thin  Skin:  Reviewed, no issues  Last BM:  9/23  Height:   Ht Readings from Last 1 Encounters:  06/01/16 5' 3"  (1.6 m)    Weight:   Wt Readings from Last 1 Encounters:  06/03/16 131 lb 3.2 oz (59.5 kg)    Ideal Body Weight:  52.27 kg  BMI:  Body mass index is 23.24 kg/m.  Estimated Nutritional Needs:   Kcal:  1500-1800 calories  Protein:  60-72 gm  Fluid:  >/= 1.5L  EDUCATION NEEDS:   No education needs identified at this time  WSatira Anis Spruha Weight, MS, RD LDN Inpatient Clinical Dietitian Pager [phone]

## 2016-06-04 DIAGNOSIS — R112 Nausea with vomiting, unspecified: Secondary | ICD-10-CM | POA: Diagnosis not present

## 2016-06-04 DIAGNOSIS — E1143 Type 2 diabetes mellitus with diabetic autonomic (poly)neuropathy: Secondary | ICD-10-CM | POA: Diagnosis not present

## 2016-06-04 DIAGNOSIS — I10 Essential (primary) hypertension: Secondary | ICD-10-CM | POA: Diagnosis not present

## 2016-06-04 DIAGNOSIS — I4891 Unspecified atrial fibrillation: Secondary | ICD-10-CM | POA: Diagnosis not present

## 2016-06-04 LAB — CBC
HEMATOCRIT: 27.2 % — AB (ref 35.0–47.0)
Hemoglobin: 8.9 g/dL — ABNORMAL LOW (ref 12.0–16.0)
MCH: 25.4 pg — AB (ref 26.0–34.0)
MCHC: 32.7 g/dL (ref 32.0–36.0)
MCV: 77.6 fL — AB (ref 80.0–100.0)
Platelets: 101 10*3/uL — ABNORMAL LOW (ref 150–440)
RBC: 3.5 MIL/uL — AB (ref 3.80–5.20)
RDW: 18.5 % — ABNORMAL HIGH (ref 11.5–14.5)
WBC: 4.7 10*3/uL (ref 3.6–11.0)

## 2016-06-04 LAB — GLUCOSE, CAPILLARY
Glucose-Capillary: 231 mg/dL — ABNORMAL HIGH (ref 65–99)
Glucose-Capillary: 244 mg/dL — ABNORMAL HIGH (ref 65–99)
Glucose-Capillary: 197 mg/dL — ABNORMAL HIGH (ref 65–99)
Glucose-Capillary: 175 mg/dL — ABNORMAL HIGH (ref 65–99)

## 2016-06-04 MED ORDER — OXYCODONE HCL 5 MG PO TABS: 5.0000 mg | ORAL_TABLET | Freq: Four times a day (QID) | ORAL | Status: DC | PRN

## 2016-06-04 MED ORDER — INSULIN DETEMIR 100 UNIT/ML ~~LOC~~ SOLN
10.0000 [IU] | Freq: Every day | SUBCUTANEOUS | Status: DC
  Administered 2016-06-04 – 2016-06-05 (×2): 10 [IU] via SUBCUTANEOUS
  Filled 2016-06-04 (×3): qty 0.1

## 2016-06-04 MED ORDER — SENNOSIDES-DOCUSATE SODIUM 8.6-50 MG PO TABS
1.0000 | ORAL_TABLET | Freq: Two times a day (BID) | ORAL | Status: DC
  Administered 2016-06-04 – 2016-06-06 (×3): 1 via ORAL
  Filled 2016-06-04 (×5): qty 1

## 2016-06-04 NOTE — Progress Notes (Signed)
Inpatient Diabetes Program Recommendations  AACE/ADA: New Consensus Statement on Inpatient Glycemic Control (2015)  Target Ranges:  Prepandial:   less than 140 mg/dL      Peak postprandial:   less than 180 mg/dL (1-2 hours)      Critically ill patients:  140 - 180 mg/dL   Results for [name], [name] (MRN [id number]) as of 06/04/2016 09:11  Ref. Range 06/03/2016 07:21 06/03/2016 11:18 06/03/2016 16:24 06/03/2016 21:32  Glucose-Capillary Latest Ref Range: 65 - 99 mg/dL 221 (H) 205 (H) 211 (H) 244 (H)   Review of Glycemic Control  Outpatient Diabetes medications: Levemir 17 units QHS, Novolog 5 units TID with meals Current orders for Inpatient glycemic control: Levemir 5 units QHS, Novolog 0-15 units TID with meals, Novolog 0-5 units QHS  Inpatient Diabetes Program Recommendations: Insulin - Basal: Fasting glucose has been over 200 mg/dl over the past 2 days. Please consider increasing Levemir to 10 units QHS.  Thanks, Barnie Alderman, RN, MSN, CDE Diabetes Coordinator Inpatient Diabetes Program [phone] (Team Pager from [place] to [place]) [phone] ([facility] office) [phone] [facility] office) [phone] [facility] [address] office)

## 2016-06-04 NOTE — Progress Notes (Signed)
[place] at [place] NAME: [name]    MRN#:  [id number]  DATE OF BIRTH:  02-Mar-1956  SUBJECTIVE:  Hospital Day: 0 days [name] is a 60 y.o. female presenting with Nausea .   Overnight events: No overnight events Interval Events: Actually somewhat improved today only used 1 dose IV morphine, tolerating diet, no further complaints  REVIEW OF SYSTEMS:  CONSTITUTIONAL: No fever, fatigue or weakness.  EYES: No blurred or double vision.  EARS, NOSE, AND THROAT: No tinnitus or ear pain.  RESPIRATORY: No cough, shortness of breath, wheezing or hemoptysis.  CARDIOVASCULAR: No chest pain, orthopnea, edema.  GASTROINTESTINAL: Positive nausea, abdominal pain.  GENITOURINARY: No dysuria, hematuria.  ENDOCRINE: No polyuria, nocturia,  HEMATOLOGY: No anemia, easy bruising or bleeding SKIN: No rash or lesion. MUSCULOSKELETAL: No joint pain or arthritis.   NEUROLOGIC: No tingling, numbness, weakness.  PSYCHIATRY: No anxiety or depression.   DRUG ALLERGIES:   Allergies  Allergen Reactions  . Hydrocodone Other (See Comments)    Pt states that this medication caused cirrhosis of the liver.    . Aspirin   . Erythromycin Other (See Comments)    Reaction:  Fever   . Prednisone Other (See Comments)    Reaction:  Unknown   . Rosiglitazone Maleate Swelling  . Codeine Sulfate Rash  . Tetanus-Diphtheria Toxoids Td Rash and Other (See Comments)    Reaction:  Fever     VITALS:  Blood pressure (!) 147/65, pulse (!) 103, temperature 98.2 F (36.8 C), temperature source Oral, resp. rate 17, height 5' 3"  (1.6 m), weight 59.7 kg (131 lb 9.6 oz), SpO2 97 %.  PHYSICAL EXAMINATION:  VITAL SIGNS: Vitals:   06/03/16 2213 06/04/16 0545  BP:  (!) 147/65  Pulse: (!) 126 (!) 103  Resp:  17  Temp:  98.2 F (36.8 C)   GENERAL:60 y.o.female currently in no acute distress.  HEAD: Normocephalic, atraumatic.  EYES: Pupils equal, round,  reactive to light. Extraocular muscles intact. No scleral icterus.  MOUTH: Moist mucosal membrane. Dentition intact. No abscess noted.  EAR, NOSE, THROAT: Clear without exudates. No external lesions.  NECK: Supple. No thyromegaly. No nodules. No JVD.  PULMONARY: Clear to ascultation, without wheeze rails or rhonci. No use of accessory muscles, Good respiratory effort. good air entry bilaterally CHEST: Nontender to palpation.  CARDIOVASCULAR: S1 and S2. Regular rate and rhythm. No murmurs, rubs, or gallops. No edema. Pedal pulses 2+ bilaterally.  GASTROINTESTINAL: Soft, no tenderness, nondistended. No masses. Positive bowel sounds. No hepatosplenomegaly.  MUSCULOSKELETAL: No swelling, clubbing, or edema. Range of motion full in all extremities.  NEUROLOGIC: Cranial nerves II through XII are intact. No gross focal neurological deficits. Sensation intact. Reflexes intact.  SKIN: No ulceration, lesions, rashes, or cyanosis. Skin warm and dry. Turgor intact.  PSYCHIATRIC: Mood, affect within normal limits. The patient is awake, alert and oriented x 3. Insight, judgment intact.      LABORATORY PANEL:   CBC  Recent Labs Lab 06/04/16 0429  WBC 4.7  HGB 8.9*  HCT 27.2*  PLT 101*   ------------------------------------------------------------------------------------------------------------------  Chemistries   Recent Labs Lab 06/01/16 2326  NA 137  K 3.7  CL 104  CO2 19*  GLUCOSE 395*  BUN 20  CREATININE 0.97  CALCIUM 9.2  AST 80*  ALT 54  ALKPHOS 122  BILITOT 1.6*   ------------------------------------------------------------------------------------------------------------------  Cardiac Enzymes  Recent Labs Lab 06/01/16 2326  TROPONINI <0.03   ------------------------------------------------------------------------------------------------------------------  RADIOLOGY:  No results found.  EKG:   Orders placed or performed during the hospital encounter of 05/11/16   . EKG 12-Lead  . EKG 12-Lead  . EKG 12-Lead  . EKG 12-Lead  . EKG 12-Lead  . EKG 12-Lead    ASSESSMENT AND PLAN:   [name] is a 60 y.o. female presenting with Nausea . Admitted 06/01/2016 : Day #: 0 days 1. Intractable nausea/vomiting: Secondary to gastroparesis this is a recurrent issue-as suspected upon further questioning she states she ran out of her pain medications and he is yet to see a gastroenterologist but does have an appointment scheduled. Continue with supportive measures advance diet as tolerated   Symptoms slowly improving hopefully discharge tomorrow  2. Type 2 diabetes insulin requiring: Increased insulin -patient's not taking regularly scheduled insulin at home secondary to noncompliance states she does take her brothers insulin because he no longer needs it  3. Hyperlipidemia unspecified: Statin therapy 4. Hypothyroidism unspecified: Synthroid   All the records are reviewed and case discussed with Care Management/Social Workerr. Management plans discussed with the patient, family and they are in agreement.  CODE STATUS: full TOTAL TIME TAKING CARE OF THIS PATIENT: 28 minutes.   POSSIBLE D/C IN 1-2DAYS, DEPENDING ON CLINICAL CONDITION.   [name],  Karenann Cai.D on 06/04/2016 at 12:00 PM  Between 7am to 6pm - Pager - [phone]  After 6pm: House Pager: - [facility]  Office  [phone]  CC: Primary care physician; No PCP Per Patient

## 2016-06-05 DIAGNOSIS — I4891 Unspecified atrial fibrillation: Secondary | ICD-10-CM | POA: Diagnosis not present

## 2016-06-05 DIAGNOSIS — I10 Essential (primary) hypertension: Secondary | ICD-10-CM | POA: Diagnosis not present

## 2016-06-05 DIAGNOSIS — R112 Nausea with vomiting, unspecified: Secondary | ICD-10-CM | POA: Diagnosis not present

## 2016-06-05 DIAGNOSIS — E1143 Type 2 diabetes mellitus with diabetic autonomic (poly)neuropathy: Secondary | ICD-10-CM | POA: Diagnosis not present

## 2016-06-05 LAB — GLUCOSE, CAPILLARY
Glucose-Capillary: 190 mg/dL — ABNORMAL HIGH (ref 65–99)
Glucose-Capillary: 241 mg/dL — ABNORMAL HIGH (ref 65–99)
Glucose-Capillary: 290 mg/dL — ABNORMAL HIGH (ref 65–99)
Glucose-Capillary: 234 mg/dL — ABNORMAL HIGH (ref 65–99)

## 2016-06-05 MED ORDER — MORPHINE SULFATE ER 15 MG PO TBCR
30.0000 mg | EXTENDED_RELEASE_TABLET | Freq: Three times a day (TID) | ORAL | Status: DC
  Administered 2016-06-05 – 2016-06-06 (×4): 30 mg via ORAL
  Filled 2016-06-05 (×4): qty 2

## 2016-06-05 NOTE — Progress Notes (Signed)
[place] [facility] [place] NAME: [name]    MRN#:  [id number]  DATE OF BIRTH:  [birth year]  SUBJECTIVE:  Hospital Day: 0 days [name] is a 60 y.o. female presenting with Nausea .   Overnight events: No overnight events Interval Events: Still complains of nausea abdominal pain, however having bowel movements with no issue  REVIEW OF SYSTEMS:  CONSTITUTIONAL: No fever, fatigue or weakness.  EYES: No blurred or double vision.  EARS, NOSE, AND THROAT: No tinnitus or ear pain.  RESPIRATORY: No cough, shortness of breath, wheezing or hemoptysis.  CARDIOVASCULAR: No chest pain, orthopnea, edema.  GASTROINTESTINAL: Positive nausea, abdominal pain.  GENITOURINARY: No dysuria, hematuria.  ENDOCRINE: No polyuria, nocturia,  HEMATOLOGY: No anemia, easy bruising or bleeding SKIN: No rash or lesion. MUSCULOSKELETAL: No joint pain or arthritis.   NEUROLOGIC: No tingling, numbness, weakness.  PSYCHIATRY: No anxiety or depression.   DRUG ALLERGIES:   Allergies  Allergen Reactions  . Hydrocodone Other (See Comments)    Pt states that this medication caused cirrhosis of the liver.    . Aspirin   . Erythromycin Other (See Comments)    Reaction:  Fever   . Prednisone Other (See Comments)    Reaction:  Unknown   . Rosiglitazone Maleate Swelling  . Codeine Sulfate Rash  . Tetanus-Diphtheria Toxoids Td Rash and Other (See Comments)    Reaction:  Fever     VITALS:  Blood pressure (!) 145/72, pulse 95, temperature 99.1 F (37.3 C), temperature source Oral, resp. rate 19, height 5' 3"  (1.6 m), weight 63.7 kg (140 lb 8 oz), SpO2 98 %.  PHYSICAL EXAMINATION:  VITAL SIGNS: Vitals:   06/05/16 0354 06/05/16 0637  BP: (!) 166/76 (!) 145/72  Pulse: (!) 105 95  Resp: (!) 21 19  Temp: 100 F (37.8 C) 99.1 F (37.3 C)   GENERAL:60 y.o.female currently in no acute distress.  HEAD: Normocephalic, atraumatic.  EYES: Pupils equal, round,  reactive to light. Extraocular muscles intact. No scleral icterus.  MOUTH: Moist mucosal membrane. Dentition intact. No abscess noted.  EAR, NOSE, THROAT: Clear without exudates. No external lesions.  NECK: Supple. No thyromegaly. No nodules. No JVD.  PULMONARY: Clear to ascultation, without wheeze rails or rhonci. No use of accessory muscles, Good respiratory effort. good air entry bilaterally CHEST: Nontender to palpation.  CARDIOVASCULAR: S1 and S2. Regular rate and rhythm. No murmurs, rubs, or gallops. No edema. Pedal pulses 2+ bilaterally.  GASTROINTESTINAL: Soft, no tenderness, nondistended. No masses. Positive bowel sounds. No hepatosplenomegaly.  MUSCULOSKELETAL: No swelling, clubbing, or edema. Range of motion full in all extremities.  NEUROLOGIC: Cranial nerves II through XII are intact. No gross focal neurological deficits. Sensation intact. Reflexes intact.  SKIN: No ulceration, lesions, rashes, or cyanosis. Skin warm and dry. Turgor intact.  PSYCHIATRIC: Mood, affect within normal limits. The patient is awake, alert and oriented x 3. Insight, judgment intact.      LABORATORY PANEL:   CBC  Recent Labs Lab 06/04/16 0429  WBC 4.7  HGB 8.9*  HCT 27.2*  PLT 101*   ------------------------------------------------------------------------------------------------------------------  Chemistries   Recent Labs Lab 06/01/16 2326  NA 137  K 3.7  CL 104  CO2 19*  GLUCOSE 395*  BUN 20  CREATININE 0.97  CALCIUM 9.2  AST 80*  ALT 54  ALKPHOS 122  BILITOT 1.6*   ------------------------------------------------------------------------------------------------------------------  Cardiac Enzymes  Recent Labs Lab 06/01/16 2326  TROPONINI <0.03   ------------------------------------------------------------------------------------------------------------------  RADIOLOGY:  No results found.  EKG:   Orders placed or performed during the hospital encounter of 05/11/16   . EKG 12-Lead  . EKG 12-Lead  . EKG 12-Lead  . EKG 12-Lead  . EKG 12-Lead  . EKG 12-Lead    ASSESSMENT AND PLAN:   [name] is a 60 y.o. female presenting with Nausea . Admitted 06/01/2016 : Day #: 0 days 1. Intractable nausea/vomiting: Secondary to gastroparesis this is a recurrent issue-as suspected upon further questioning she states she ran out of her pain medications and he is yet to see a gastroenterologist but does have an appointment scheduled. Continue with supportive measures advance diet as tolerated  Minimize use of IV narcotics  2. Type 2 diabetes insulin requiring: Increased insulin -patient's not taking regularly scheduled insulin at home secondary to noncompliance states she does take her brothers insulin because he no longer needs it  3. Hyperlipidemia unspecified: Statin therapy 4. Hypothyroidism unspecified: Synthroid   All the records are reviewed and case discussed with Care Management/Social Workerr. Management plans discussed with the patient, family and they are in agreement.  CODE STATUS: full TOTAL TIME TAKING CARE OF THIS PATIENT: 28 minutes.   POSSIBLE D/C IN 1-2DAYS, DEPENDING ON CLINICAL CONDITION.   Hower,  Karenann Cai.D on 06/05/2016 at 12:42 PM  Between 7am to 6pm - Pager - [phone]  After 6pm: House Pager: - [phone]  [facility]  Office  [phone]  CC: Primary care physician; No PCP Per Patient

## 2016-06-06 DIAGNOSIS — I10 Essential (primary) hypertension: Secondary | ICD-10-CM | POA: Diagnosis not present

## 2016-06-06 DIAGNOSIS — R112 Nausea with vomiting, unspecified: Secondary | ICD-10-CM | POA: Diagnosis not present

## 2016-06-06 DIAGNOSIS — I4891 Unspecified atrial fibrillation: Secondary | ICD-10-CM | POA: Diagnosis not present

## 2016-06-06 DIAGNOSIS — E1143 Type 2 diabetes mellitus with diabetic autonomic (poly)neuropathy: Secondary | ICD-10-CM | POA: Diagnosis not present

## 2016-06-06 LAB — CREATININE, SERUM: CREATININE: 0.7 mg/dL (ref 0.44–1.00)

## 2016-06-06 LAB — GLUCOSE, CAPILLARY
Glucose-Capillary: 248 mg/dL — ABNORMAL HIGH (ref 65–99)
Glucose-Capillary: 207 mg/dL — ABNORMAL HIGH (ref 65–99)

## 2016-06-06 MED ORDER — OXYCODONE HCL 5 MG PO TABS: 5.0000 mg | ORAL_TABLET | ORAL | 0 refills | Status: DC | PRN

## 2016-06-06 MED ORDER — DICYCLOMINE HCL 10 MG PO CAPS: 10.0000 mg | ORAL_CAPSULE | Freq: Three times a day (TID) | ORAL | 0 refills | Status: DC

## 2016-06-06 MED ORDER — OXYCODONE HCL 5 MG PO TABS
5.0000 mg | ORAL_TABLET | ORAL | Status: DC | PRN
  Filled 2016-06-06 (×2): qty 1

## 2016-06-06 MED ORDER — MORPHINE SULFATE ER 30 MG PO TBCR: 30.0000 mg | EXTENDED_RELEASE_TABLET | Freq: Three times a day (TID) | ORAL | 0 refills | Status: DC

## 2016-06-06 MED ORDER — OXYCODONE HCL 5 MG PO TABS
10.0000 mg | ORAL_TABLET | ORAL | Status: DC | PRN
  Administered 2016-06-06: 10 mg via ORAL

## 2016-06-06 NOTE — Discharge Summary (Signed)
[place] [facility] [place] NAME: [name]    MR#:  [id number]  DATE OF BIRTH:  1955-10-19  DATE OF ADMISSION:  06/01/2016 ADMITTING PHYSICIAN: Harrie Foreman, MD  DATE OF DISCHARGE: 06/06/16  PRIMARY CARE PHYSICIAN: No PCP Per Patient    ADMISSION DIAGNOSIS:  Gastroparesis [K31.84] Epigastric pain [R10.13] Tachycardia [R00.0] Essential hypertension [I10]  DISCHARGE DIAGNOSIS:  Active Problems:   Intractable nausea and vomiting gastroparesis Intractable abdominal pain  SECONDARY DIAGNOSIS:   Past Medical History:  Diagnosis Date  . Allergy   . Anxiety   . Cancer (HCC)    HX OF CANCER OF UTERUS   . Cirrhosis of liver not due to alcohol ([place]) 2016  . Degenerative disk disease   . Diverticulitis   . Gastroparesis   . GERD (gastroesophageal reflux disease)   . History of hiatal hernia   . Hypertension   . Hypothyroid   . Intussusception intestine ([place]) 05/2015  . PAF (paroxysmal atrial fibrillation) ([place]) 03/2015   a. new onset 03/2015 in setting of intractable N/V; b. on Eliquis 5 mg bid; c. CHADSVASc 4 (DM, TIA x 2, female)  . Pancreatitis   . Sick sinus syndrome ([place])   . Stomach ulcer   . Stroke [facility])    with minimal left sided weakness  . Syncope 01/2015  . TIA (transient ischemic attack) 02/2015  . Type 1 diabetes [facility])    on levemir    HOSPITAL COURSE:  Jaeleah [name]  is a 60 y.o. female admitted 06/01/2016 with chief complaint Nausea . Please see H&P performed by Harrie Foreman, MD for further information. She presented with the above symptoms. Related to chronic gastroparesis -- and running out of medications. She has follow up with a PCP scheduled for the start of Oct. Her symptoms have improved and able to tolerate a regular diet  DISCHARGE CONDITIONS:   stable  CONSULTS OBTAINED:  Treatment Team:  Lytle Butte, MD  DRUG ALLERGIES:   Allergies  Allergen Reactions  . Hydrocodone Other (See  Comments)    Pt states that this medication caused cirrhosis of the liver.    . Aspirin   . Erythromycin Other (See Comments)    Reaction:  Fever   . Prednisone Other (See Comments)    Reaction:  Unknown   . Rosiglitazone Maleate Swelling  . Codeine Sulfate Rash  . Tetanus-Diphtheria Toxoids Td Rash and Other (See Comments)    Reaction:  Fever     DISCHARGE MEDICATIONS:   Current Discharge Medication List    CONTINUE these medications which have CHANGED   Details  dicyclomine (BENTYL) 10 MG capsule Take 1 capsule (10 mg total) by mouth 4 (four) times daily -  before meals and at bedtime. Qty: 120 capsule, Refills: 0    morphine (MS CONTIN) 30 MG 12 hr tablet Take 1 tablet (30 mg total) by mouth every 8 (eight) hours. Qty: 45 tablet, Refills: 0    oxyCODONE (OXY IR/ROXICODONE) 5 MG immediate release tablet Take 1 tablet (5 mg total) by mouth every 4 (four) hours as needed for moderate pain. Qty: 30 tablet, Refills: 0      CONTINUE these medications which have NOT CHANGED   Details  acidophilus (RISAQUAD) CAPS capsule Take 2 capsules by mouth daily. Qty: 60 capsule, Refills: 0    albuterol (PROVENTIL HFA;VENTOLIN HFA) 108 (90 Base) MCG/ACT inhaler Inhale 2 puffs into the lungs every 6 (six) hours as needed for  wheezing or shortness of breath. Qty: 1 Inhaler, Refills: 2    apixaban (ELIQUIS) 5 MG TABS tablet Take 1 tablet (5 mg total) by mouth 2 (two) times daily. Qty: 60 tablet, Refills: 0    atorvastatin (LIPITOR) 40 MG tablet Take 1 tablet (40 mg total) by mouth at bedtime. Qty: 30 tablet, Refills: 0    bismuth subsalicylate (PEPTO BISMOL) 262 MG/15ML suspension Take 30 mLs by mouth every 4 (four) hours as needed for diarrhea or loose stools. Qty: 360 mL, Refills: 0    budesonide-formoterol (SYMBICORT) 160-4.5 MCG/ACT inhaler Inhale 2 puffs into the lungs 2 (two) times daily. Qty: 1 Inhaler, Refills: 1    diphenhydrAMINE (BENADRYL) 25 mg capsule Take 2 capsules (50 mg  total) by mouth every 6 (six) hours as needed. Qty: 60 capsule, Refills: 0    feeding supplement, GLUCERNA SHAKE, (GLUCERNA SHAKE) LIQD Take 237 mLs by mouth 3 (three) times daily between meals. Qty: 60 Can, Refills: 0    insulin aspart (NOVOLOG) 100 UNIT/ML injection Inject 5 Units into the skin 3 (three) times daily with meals as needed for high blood sugar. Pt uses as needed per sliding scale. Qty: 10 mL, Refills: 11    insulin detemir (LEVEMIR) 100 UNIT/ML injection Inject 0.05 mLs (5 Units total) into the skin at bedtime. Qty: 10 mL, Refills: 11    levothyroxine (SYNTHROID, LEVOTHROID) 75 MCG tablet Take 1 tablet (75 mcg total) by mouth daily before breakfast. Qty: 30 tablet, Refills: 1    metoCLOPramide (REGLAN) 10 MG tablet Take 1 tablet (10 mg total) by mouth 4 (four) times daily -  before meals and at bedtime. Qty: 60 tablet, Refills: 0    ondansetron (ZOFRAN ODT) 4 MG disintegrating tablet Take 1 tablet (4 mg total) by mouth every 8 (eight) hours as needed for nausea or vomiting. Qty: 20 tablet, Refills: 0    pantoprazole (PROTONIX) 40 MG tablet Take 1 tablet (40 mg total) by mouth 2 (two) times daily before a meal. Qty: 30 tablet, Refills: 0    rifaximin (XIFAXAN) 550 MG TABS tablet Take 1 tablet (550 mg total) by mouth 2 (two) times daily. Qty: 60 tablet, Refills: 1    spironolactone (ALDACTONE) 25 MG tablet Take 1 tablet (25 mg total) by mouth daily. Qty: 30 tablet, Refills: 0    sucralfate (CARAFATE) 1 GM/10ML suspension Take 10 mLs (1 g total) by mouth 4 (four) times daily -  with meals and at bedtime. Qty: 420 mL, Refills: 0    tiZANidine (ZANAFLEX) 4 MG tablet Take 1 tablet (4 mg total) by mouth 3 (three) times daily as needed for muscle spasms. Qty: 12 tablet, Refills: 0         DISCHARGE INSTRUCTIONS:    DIET:  Diabetic diet  DISCHARGE CONDITION:  Stable  ACTIVITY:  Activity as tolerated  OXYGEN:  Home Oxygen: No.   Oxygen Delivery: room  air  DISCHARGE LOCATION:  home   If you experience worsening of your admission symptoms, develop shortness of breath, life threatening emergency, suicidal or homicidal thoughts you must seek medical attention immediately by calling 911 or calling your MD immediately  if symptoms less severe.  You Must read complete instructions/literature along with all the possible adverse reactions/side effects for all the Medicines you take and that have been prescribed to you. Take any new Medicines after you have completely understood and accpet all the possible adverse reactions/side effects.   Please note  You were cared for by a  hospitalist during your hospital stay. If you have any questions about your discharge medications or the care you received while you were in the hospital after you are discharged, you can call the unit and asked to speak with the hospitalist on call if the hospitalist that took care of you is not available. Once you are discharged, your primary care physician will handle any further medical issues. Please note that NO REFILLS for any discharge medications will be authorized once you are discharged, as it is imperative that you return to your primary care physician (or establish a relationship with a primary care physician if you do not have one) for your aftercare needs so that they can reassess your need for medications and monitor your lab values.    On the day of Discharge:   VITAL SIGNS:  Blood pressure 130/61, pulse 100, temperature 99.1 F (37.3 C), temperature source Oral, resp. rate 18, height 5' 3"  (1.6 m), weight 64.4 kg (141 lb 14.4 oz), SpO2 96 %.  I/O:   Intake/Output Summary (Last 24 hours) at 06/06/16 1034 Last data filed at 06/06/16 0730  Gross per 24 hour  Intake              360 ml  Output             2050 ml  Net            -1690 ml    PHYSICAL EXAMINATION:  GENERAL:  60 y.o.-year-old patient lying in the bed with no acute distress.  EYES: Pupils  equal, round, reactive to light and accommodation. No scleral icterus. Extraocular muscles intact.  HEENT: Head atraumatic, normocephalic. Oropharynx and nasopharynx clear.  NECK:  Supple, no jugular venous distention. No thyroid enlargement, no tenderness.  LUNGS: Normal breath sounds bilaterally, no wheezing, rales,rhonchi or crepitation. No use of accessory muscles of respiration.  CARDIOVASCULAR: S1, S2 normal. No murmurs, rubs, or gallops.  ABDOMEN: Soft, non-tender, non-distended. Bowel sounds present. No organomegaly or mass.  EXTREMITIES: No pedal edema, cyanosis, or clubbing.  NEUROLOGIC: Cranial nerves II through XII are intact. Muscle strength 5/5 in all extremities. Sensation intact. Gait not checked.  PSYCHIATRIC: The patient is alert and oriented x 3.  SKIN: No obvious rash, lesion, or ulcer.   DATA REVIEW:   CBC  Recent Labs Lab 06/04/16 0429  WBC 4.7  HGB 8.9*  HCT 27.2*  PLT 101*    Chemistries   Recent Labs Lab 06/01/16 2326 06/06/16 0552  NA 137  --   K 3.7  --   CL 104  --   CO2 19*  --   GLUCOSE 395*  --   BUN 20  --   CREATININE 0.97 0.70  CALCIUM 9.2  --   AST 80*  --   ALT 54  --   ALKPHOS 122  --   BILITOT 1.6*  --     Cardiac Enzymes  Recent Labs Lab 06/01/16 2326  TROPONINI <0.03    Microbiology Results  Results for orders placed or performed during the hospital encounter of 03/13/16  C difficile quick scan w PCR reflex     Status: None   Collection Time: 03/15/16  6:33 PM  Result Value Ref Range Status   C Diff antigen NEGATIVE NEGATIVE Final   C Diff toxin NEGATIVE NEGATIVE Final   C Diff interpretation No C. difficile detected.  Final    RADIOLOGY:  No results found.   Management plans discussed with the patient, family  and they are in agreement.  CODE STATUS:     Code Status Orders        Start     Ordered   06/02/16 0546  Do not attempt resuscitation (DNR)  Continuous    Question Answer Comment  In the event  of cardiac or respiratory ARREST Do not call a "code blue"   In the event of cardiac or respiratory ARREST Do not perform Intubation, CPR, defibrillation or ACLS   In the event of cardiac or respiratory ARREST Use medication by any route, position, wound care, and other measures to relive pain and suffering. May use oxygen, suction and manual treatment of airway obstruction as needed for comfort.      06/02/16 0546    Code Status History    Date Active Date Inactive Code Status Order ID Comments User Context   05/12/2016 11:43 AM 05/16/2016  4:38 PM DNR [id number]  Dustin Flock, MD Inpatient   05/12/2016  1:13 AM 05/12/2016 11:43 AM Full Code [id number]  Harvie Bridge, DO Inpatient   04/07/2016  6:18 AM 04/09/2016  2:19 PM DNR [id number]  Harrie Foreman, MD Inpatient   03/13/2016  9:25 PM 03/16/2016  6:32 PM DNR [id number]  Lance Coon, MD Inpatient   02/04/2016  9:39 AM 02/07/2016  2:59 PM DNR [id number]  Saundra Shelling, MD Inpatient   01/21/2016 10:24 PM 01/25/2016  5:02 PM DNR [id number]  Lance Coon, MD Inpatient   01/15/2016  1:39 PM 01/19/2016  3:29 PM DNR [id number]  Gladstone Lighter, MD ED   01/10/2016 10:00 PM 01/13/2016  5:24 PM DNR [id number]  Harrie Foreman, MD Inpatient   12/27/2015 11:02 PM 01/01/2016  6:17 PM DNR [id number]  Max Sane, MD Inpatient   12/16/2015  4:54 PM 12/19/2015  4:52 PM DNR [id number]  Loletha Grayer, MD ED   11/11/2015 11:36 AM 11/16/2015  2:05 PM Full Code [id number]  Samella Parr, NP Inpatient   10/15/2015  1:23 AM 10/16/2015  6:17 PM Full Code [id number]  Lance Coon, MD Inpatient   10/04/2015  8:19 PM 10/08/2015  6:34 PM Full Code [id number]  Bethena Roys, MD Inpatient   08/15/2015  1:05 AM 08/16/2015  8:04 PM Full Code [id number]  Harrie Foreman, MD Inpatient   08/01/2015  1:25 AM 08/06/2015  5:41 PM Full Code [id number]  Harrie Foreman, MD Inpatient   07/04/2015  2:53 AM 07/11/2015  6:33 PM Full Code [id number]  Lytle Butte, MD ED   05/27/2015 10:48 PM 05/31/2015  2:37 PM  Full Code [id number]  Rise Patience, MD Inpatient   05/19/2015  1:14 AM 05/22/2015  4:28 PM Full Code [id number]  Rise Patience, MD Inpatient   04/05/2015  9:16 AM 04/07/2015  5:23 PM Full Code [id number]  Bettey Costa, MD Inpatient   04/03/2015 12:31 PM 04/05/2015  9:16 AM Full Code [id number]  Bettey Costa, MD Inpatient   03/11/2015  4:49 PM 03/14/2015  3:50 PM Full Code [id number]  Dustin Flock, MD Inpatient   02/15/2015  4:51 PM 02/17/2015  2:33 PM DNR [id number]  Dustin Flock, MD Inpatient   02/15/2015  4:35 PM 02/15/2015  4:51 PM Full Code [id number]  Dustin Flock, MD Inpatient   01/11/2015  2:40 AM 01/12/2015  6:11 PM Full Code [id number]  Etta Quill, DO ED    Advance Directive Documentation   Flowsheet Row Most Recent Value  Type of Advance Directive  Healthcare Power of [place], Living will  Pre-existing out  of facility DNR order (yellow form or pink MOST form)  No data  "MOST" Form in Place?  No data      TOTAL TIME TAKING CARE OF THIS PATIENT: 33 minutes.    Jonavan Vanhorn,  Karenann Cai.D on 06/06/2016 at 10:34 AM  Between 7am to 6pm - Pager - [phone]  After 6pm go to [email or web address] - [facility] [place] [facility]  Office  [phone]  CC: Primary care physician; No PCP Per Patient

## 2016-06-06 NOTE — Progress Notes (Signed)
Pt d/c to home today.  IV removed intact.  Rx's given to pt w/all questions and concerns addressed.  D/C paperwork reviewed and education provided with all questions and concerns addressed.  Pt husband at bedside for home transport.

## 2016-06-06 NOTE — Progress Notes (Signed)
Inpatient Diabetes Program Recommendations  AACE/ADA: New Consensus Statement on Inpatient Glycemic Control (2015)  Target Ranges:  Prepandial:   less than 140 mg/dL      Peak postprandial:   less than 180 mg/dL (1-2 hours)      Critically ill patients:  140 - 180 mg/dL   Results for [name], [name] (MRN [id number]) as of 06/06/2016 10:25  Ref. Range 06/05/2016 07:33 06/05/2016 11:26 06/05/2016 16:33 06/05/2016 21:30 06/06/2016 07:31  Glucose-Capillary Latest Ref Range: 65 - 99 mg/dL 190 (H) 241 (H) 290 (H) 234 (H) 248 (H)   Review of Glycemic Control   Outpatient Diabetes medications: Levemir 17 units QHS, Novolog 5 units TID with meals Current orders for Inpatient glycemic control: Levemir 10 units QHS, Novolog 0-15 units TID with meals, Novolog 0-5 units QHS  Inpatient Diabetes Program Recommendations: Insulin - Basal: Fasting glucose 248 mg/dl today. Please consider increasing Levemir to 14 units QHS. Diet: If appropriate, please consider changing diet to Carb Modified (currently ordered Regular). Insulin-Meal Coverage: If patient is eating at least 50% of meals, please consider ordering Novolog 3 units TID with meals for meal coverage.  Thanks, Barnie Alderman, RN, MSN, CDE Diabetes Coordinator Inpatient Diabetes Program [phone] (Team Pager from [place] to [place]) [phone] ([place] office) [phone] [facility] office) [phone] [facility] office)

## 2016-06-17 ENCOUNTER — Observation Stay
Admission: EM | Admit: 2016-06-17 | Discharge: 2016-06-20 | Disposition: A | Discharge status: Home / Self Care | Payer: Commercial Managed Care - HMO | Attending: Internal Medicine | Admitting: Internal Medicine

## 2016-06-17 ENCOUNTER — Encounter: Payer: Self-pay | Admitting: Emergency Medicine

## 2016-06-17 DIAGNOSIS — I480 Paroxysmal atrial fibrillation: Secondary | ICD-10-CM | POA: Insufficient documentation

## 2016-06-17 DIAGNOSIS — E785 Hyperlipidemia, unspecified: Secondary | ICD-10-CM | POA: Diagnosis not present

## 2016-06-17 DIAGNOSIS — G894 Chronic pain syndrome: Secondary | ICD-10-CM | POA: Diagnosis not present

## 2016-06-17 DIAGNOSIS — R197 Diarrhea, unspecified: Secondary | ICD-10-CM | POA: Diagnosis not present

## 2016-06-17 DIAGNOSIS — E1165 Type 2 diabetes mellitus with hyperglycemia: Secondary | ICD-10-CM | POA: Diagnosis present

## 2016-06-17 DIAGNOSIS — R112 Nausea with vomiting, unspecified: Secondary | ICD-10-CM

## 2016-06-17 DIAGNOSIS — E1010 Type 1 diabetes mellitus with ketoacidosis without coma: Secondary | ICD-10-CM | POA: Insufficient documentation

## 2016-06-17 DIAGNOSIS — I10 Essential (primary) hypertension: Secondary | ICD-10-CM | POA: Insufficient documentation

## 2016-06-17 DIAGNOSIS — Z794 Long term (current) use of insulin: Secondary | ICD-10-CM | POA: Diagnosis not present

## 2016-06-17 DIAGNOSIS — E039 Hypothyroidism, unspecified: Secondary | ICD-10-CM | POA: Insufficient documentation

## 2016-06-17 DIAGNOSIS — K3184 Gastroparesis: Secondary | ICD-10-CM | POA: Insufficient documentation

## 2016-06-17 DIAGNOSIS — F419 Anxiety disorder, unspecified: Secondary | ICD-10-CM | POA: Diagnosis not present

## 2016-06-17 DIAGNOSIS — K219 Gastro-esophageal reflux disease without esophagitis: Secondary | ICD-10-CM | POA: Insufficient documentation

## 2016-06-17 DIAGNOSIS — Z8673 Personal history of transient ischemic attack (TIA), and cerebral infarction without residual deficits: Secondary | ICD-10-CM | POA: Insufficient documentation

## 2016-06-17 DIAGNOSIS — F329 Major depressive disorder, single episode, unspecified: Secondary | ICD-10-CM | POA: Insufficient documentation

## 2016-06-17 DIAGNOSIS — F341 Dysthymic disorder: Secondary | ICD-10-CM | POA: Diagnosis present

## 2016-06-17 DIAGNOSIS — I4891 Unspecified atrial fibrillation: Secondary | ICD-10-CM | POA: Diagnosis present

## 2016-06-17 DIAGNOSIS — Z7901 Long term (current) use of anticoagulants: Secondary | ICD-10-CM | POA: Diagnosis not present

## 2016-06-17 DIAGNOSIS — Z66 Do not resuscitate: Secondary | ICD-10-CM | POA: Diagnosis not present

## 2016-06-17 DIAGNOSIS — E1043 Type 1 diabetes mellitus with diabetic autonomic (poly)neuropathy: Principal | ICD-10-CM | POA: Insufficient documentation

## 2016-06-17 DIAGNOSIS — E119 Type 2 diabetes mellitus without complications: Secondary | ICD-10-CM | POA: Diagnosis not present

## 2016-06-17 DIAGNOSIS — Z8249 Family history of ischemic heart disease and other diseases of the circulatory system: Secondary | ICD-10-CM | POA: Insufficient documentation

## 2016-06-17 DIAGNOSIS — I495 Sick sinus syndrome: Secondary | ICD-10-CM | POA: Insufficient documentation

## 2016-06-17 DIAGNOSIS — Z8542 Personal history of malignant neoplasm of other parts of uterus: Secondary | ICD-10-CM | POA: Insufficient documentation

## 2016-06-17 DIAGNOSIS — K2970 Gastritis, unspecified, without bleeding: Secondary | ICD-10-CM | POA: Diagnosis not present

## 2016-06-17 DIAGNOSIS — F418 Other specified anxiety disorders: Secondary | ICD-10-CM | POA: Diagnosis not present

## 2016-06-17 LAB — CBC
HEMATOCRIT: 30.9 % — AB (ref 35.0–47.0)
Hemoglobin: 10 g/dL — ABNORMAL LOW (ref 12.0–16.0)
MCH: 24.9 pg — AB (ref 26.0–34.0)
MCHC: 32.2 g/dL (ref 32.0–36.0)
MCV: 77.2 fL — AB (ref 80.0–100.0)
Platelets: 189 10*3/uL (ref 150–440)
RBC: 4 MIL/uL (ref 3.80–5.20)
RDW: 18.9 % — AB (ref 11.5–14.5)
WBC: 5.2 10*3/uL (ref 3.6–11.0)

## 2016-06-17 LAB — COMPREHENSIVE METABOLIC PANEL
ALT: 17 U/L (ref 14–54)
AST: 22 U/L (ref 15–41)
Albumin: 3.5 g/dL (ref 3.5–5.0)
Alkaline Phosphatase: 110 U/L (ref 38–126)
Anion gap: 5 (ref 5–15)
BUN: 14 mg/dL (ref 6–20)
CHLORIDE: 106 mmol/L (ref 101–111)
CO2: 23 mmol/L (ref 22–32)
Calcium: 8.6 mg/dL — ABNORMAL LOW (ref 8.9–10.3)
Creatinine, Ser: 0.52 mg/dL (ref 0.44–1.00)
GFR calc Af Amer: >60 mL/min (ref >60)
Glucose, Bld: 226 mg/dL — ABNORMAL HIGH (ref 65–99)
POTASSIUM: 3.3 mmol/L — AB (ref 3.5–5.1)
SODIUM: 134 mmol/L — AB (ref 135–145)
Total Bilirubin: 0.9 mg/dL (ref 0.3–1.2)
Total Protein: 6.7 g/dL (ref 6.5–8.1)

## 2016-06-17 LAB — LIPASE, BLOOD: LIPASE: 26 U/L (ref 11–51)

## 2016-06-17 LAB — GLUCOSE, CAPILLARY: GLUCOSE-CAPILLARY: 131 mg/dL — AB (ref 65–99)

## 2016-06-17 MED ORDER — SODIUM CHLORIDE 0.9 % IV BOLUS (SEPSIS)
1000.0000 mL | Freq: Once | INTRAVENOUS | Status: AC
Start: 2016-06-17 — End: 2016-06-17
  Administered 2016-06-17: 1000 mL via INTRAVENOUS

## 2016-06-17 MED ORDER — ACETAMINOPHEN 325 MG PO TABS
650.0000 mg | ORAL_TABLET | Freq: Four times a day (QID) | ORAL | Status: DC | PRN
Start: 2016-06-17 — End: 2016-06-20

## 2016-06-17 MED ORDER — PROCHLORPERAZINE EDISYLATE 5 MG/ML IJ SOLN
5.0000 mg | INTRAMUSCULAR | Status: DC | PRN
  Administered 2016-06-19: 5 mg via INTRAVENOUS
  Filled 2016-06-17: qty 2

## 2016-06-17 MED ORDER — APIXABAN 5 MG PO TABS
5.0000 mg | ORAL_TABLET | Freq: Two times a day (BID) | ORAL | Status: DC
  Administered 2016-06-17 – 2016-06-20 (×6): 5 mg via ORAL
  Filled 2016-06-17 (×6): qty 1

## 2016-06-17 MED ORDER — METOCLOPRAMIDE HCL 5 MG/ML IJ SOLN
5.0000 mg | Freq: Three times a day (TID) | INTRAMUSCULAR | Status: DC
  Administered 2016-06-17 – 2016-06-20 (×7): 5 mg via INTRAVENOUS
  Filled 2016-06-17 (×7): qty 2

## 2016-06-17 MED ORDER — SODIUM CHLORIDE 0.9 % IV SOLN
INTRAVENOUS | Status: AC
  Administered 2016-06-17: 23:00:00 via INTRAVENOUS

## 2016-06-17 MED ORDER — INSULIN ASPART 100 UNIT/ML ~~LOC~~ SOLN
0.0000 [IU] | Freq: Four times a day (QID) | SUBCUTANEOUS | Status: DC
Start: 2016-06-17 — End: 2016-06-20
  Administered 2016-06-17 – 2016-06-18 (×2): 1 [IU] via SUBCUTANEOUS
  Administered 2016-06-18: 2 [IU] via SUBCUTANEOUS
  Administered 2016-06-18: 1 [IU] via SUBCUTANEOUS
  Administered 2016-06-19 (×2): 2 [IU] via SUBCUTANEOUS
  Administered 2016-06-19: 3 [IU] via SUBCUTANEOUS
  Administered 2016-06-19: 1 [IU] via SUBCUTANEOUS
  Administered 2016-06-20 (×2): 3 [IU] via SUBCUTANEOUS
  Filled 2016-06-17: qty 1
  Filled 2016-06-17: qty 2
  Filled 2016-06-17: qty 3
  Filled 2016-06-17 (×3): qty 1
  Filled 2016-06-17: qty 3
  Filled 2016-06-17 (×2): qty 2
  Filled 2016-06-17: qty 3

## 2016-06-17 MED ORDER — MORPHINE SULFATE (PF) 4 MG/ML IV SOLN
4.0000 mg | Freq: Once | INTRAVENOUS | Status: AC
  Administered 2016-06-17: 4 mg via INTRAVENOUS
  Filled 2016-06-17: qty 1

## 2016-06-17 MED ORDER — ACETAMINOPHEN 650 MG RE SUPP: 650.0000 mg | Freq: Four times a day (QID) | RECTAL | Status: DC | PRN

## 2016-06-17 MED ORDER — OXYCODONE HCL 5 MG PO TABS
5.0000 mg | ORAL_TABLET | Freq: Four times a day (QID) | ORAL | Status: DC | PRN
  Administered 2016-06-18 – 2016-06-19 (×5): 5 mg via ORAL
  Filled 2016-06-17 (×5): qty 1

## 2016-06-17 MED ORDER — LORAZEPAM 2 MG/ML IJ SOLN
INTRAMUSCULAR | Status: AC
  Administered 2016-06-17: 1 mg via INTRAVENOUS
  Filled 2016-06-17: qty 1

## 2016-06-17 MED ORDER — SODIUM CHLORIDE 0.9 % IV BOLUS (SEPSIS)
1000.0000 mL | Freq: Once | INTRAVENOUS | Status: AC
  Administered 2016-06-17: 1000 mL via INTRAVENOUS

## 2016-06-17 MED ORDER — MOMETASONE FURO-FORMOTEROL FUM 200-5 MCG/ACT IN AERO
2.0000 | INHALATION_SPRAY | Freq: Two times a day (BID) | RESPIRATORY_TRACT | Status: DC
  Administered 2016-06-18 – 2016-06-20 (×5): 2 via RESPIRATORY_TRACT
  Filled 2016-06-17: qty 8.8

## 2016-06-17 MED ORDER — ONDANSETRON HCL 4 MG/2ML IJ SOLN
4.0000 mg | Freq: Once | INTRAMUSCULAR | Status: AC
  Administered 2016-06-17: 4 mg via INTRAVENOUS
  Filled 2016-06-17: qty 2

## 2016-06-17 MED ORDER — MORPHINE SULFATE (PF) 4 MG/ML IV SOLN
4.0000 mg | Freq: Once | INTRAVENOUS | Status: AC
Start: 2016-06-17 — End: 2016-06-17
  Administered 2016-06-17: 4 mg via INTRAVENOUS
  Filled 2016-06-17: qty 1

## 2016-06-17 MED ORDER — LORAZEPAM 2 MG/ML IJ SOLN
1.0000 mg | Freq: Once | INTRAMUSCULAR | Status: AC
  Administered 2016-06-17: 1 mg via INTRAVENOUS

## 2016-06-17 MED ORDER — ATORVASTATIN CALCIUM 20 MG PO TABS
40.0000 mg | ORAL_TABLET | Freq: Every day | ORAL | Status: DC
  Administered 2016-06-18 – 2016-06-19 (×2): 40 mg via ORAL
  Filled 2016-06-17 (×2): qty 2

## 2016-06-17 MED ORDER — MORPHINE SULFATE ER 15 MG PO TBCR
30.0000 mg | EXTENDED_RELEASE_TABLET | Freq: Two times a day (BID) | ORAL | Status: DC
  Administered 2016-06-17 – 2016-06-20 (×6): 30 mg via ORAL
  Filled 2016-06-17 (×6): qty 2

## 2016-06-17 MED ORDER — LABETALOL HCL 5 MG/ML IV SOLN: 10.0000 mg | INTRAVENOUS | Status: DC | PRN

## 2016-06-17 MED ORDER — SODIUM CHLORIDE 0.9% FLUSH
3.0000 mL | Freq: Two times a day (BID) | INTRAVENOUS | Status: DC
  Administered 2016-06-17 – 2016-06-20 (×6): 3 mL via INTRAVENOUS

## 2016-06-17 NOTE — ED Provider Notes (Signed)
[facility] Emergency Department Provider Note ____________________________________________   I have reviewed the triage vital signs and the triage nursing note.  HISTORY  Chief Complaint Abdominal Pain   Historian Patient  HPI [name] is a 60 y.o. female with a history of type 1 diabetes, and gastroparesis with a hospitalization last month for nausea vomiting diarrhea, presents today with a five-day history now of nausea and vomiting which is nonbloody and nonbilious, midabdominal pain that radiates down the left side and to the back, as well as some nonbloody and watery diarrhea. No fevers. Pain is moderate. This feels similar to prior episodes of DKA or gastroparesis.    Past Medical History:  Diagnosis Date  . Allergy   . Anxiety   . Cancer (HCC)    HX OF CANCER OF UTERUS   . Cirrhosis of liver not due to alcohol ([place]) 2016  . Degenerative disk disease   . Diverticulitis   . Gastroparesis   . GERD (gastroesophageal reflux disease)   . History of hiatal hernia   . Hypertension   . Hypothyroid   . Intussusception intestine ([place]) 05/2015  . PAF (paroxysmal atrial fibrillation) ([place]) 03/2015   a. new onset 03/2015 in setting of intractable N/V; b. on Eliquis 5 mg bid; c. CHADSVASc 4 (DM, TIA x 2, female)  . Pancreatitis   . Sick sinus syndrome ([place])   . Stomach ulcer   . Stroke [facility])    with minimal left sided weakness  . Syncope 01/2015  . TIA (transient ischemic attack) 02/2015  . Type 1 diabetes ([place])    on levemir    Patient Active Problem List   Diagnosis Date Noted  . Epigastric pain   . Abdominal pain, epigastric   . Personal history of surgery to heart and great vessels, presenting hazards to health   . Gastritis   . Foreign body in stomach   . Abnormal findings-gastrointestinal tract   . Diarrhea   . Congestive dilated cardiomyopathy ([place])   . Generalized abdominal pain   . Nausea & vomiting   . NSTEMI (non-ST elevated  myocardial infarction) ([place]) 01/11/2016  . Tachyarrhythmia 01/10/2016  . Intractable nausea and vomiting 01/10/2016  . Tachy-brady syndrome ([place]) 12/28/2015  . Abdominal pain 12/27/2015  . Colitis 12/16/2015  . Pneumonia 11/14/2015  . Narcotic withdrawal ([place]) 11/11/2015  . Diabetes mellitus type 2, insulin dependent ([place]) 11/11/2015  . Orthostatic hypotension   . H/O TIA (transient ischemic attack) and stroke   . Left-sided weakness 10/04/2015  . Expressive aphasia 10/04/2015  . Ileus ([place]) 08/01/2015  . Ascites   . Cryptogenic cirrhosis ([place]) 07/10/2015  . Paroxysmal atrial fibrillation ([place]) 07/10/2015  . C. difficile colitis 07/10/2015  . GI (gastrointestinal bleed) 07/06/2015  . Malnutrition of moderate degree 07/04/2015  . Symptomatic bradycardia 05/27/2015  . Intussusception intestine ([place])   . Hypothyroidism due to amiodarone   . Abdominal pain, chronic, epigastric   . Chronic anemia 05/19/2015  . Thrombocytopenia ([place]) 05/19/2015  . Prolonged QT interval   . Hypomagnesemia   . Narcotic abuse   . Uncontrolled type 2 diabetes mellitus with gastroparesis ([place])   . Syncope due to orthostatic hypotension 05/18/2015  . Hypokalemia 04/06/2015  . Hyperlipidemia with target LDL less than 100 04/06/2015  . CVA (cerebral infarction) 02/15/2015  . Essential hypertension 01/12/2015  . Chronically on opiate therapy 01/12/2015  . Gastroparesis 01/12/2015  . DEPRESSION/ANXIETY 06/27/2007  . MYOFASCIAL PAIN SYNDROME 06/27/2007  . Chronic pain syndrome  03/28/2007  . GERD 03/27/2007  . DIVERTICULOSIS, COLON 03/27/2007  . LUMBAR DISC DISPLACEMENT 03/27/2007  . PROTEINURIA 03/27/2007  . UTERINE CANCER, HX OF 03/27/2007    Past Surgical History:  Procedure Laterality Date  . ABDOMINAL HYSTERECTOMY    . CARDIAC CATHETERIZATION N/A 01/12/2016   Procedure: Left Heart Cath and Coronary Angiography;  Surgeon: Wellington Hampshire, MD;  Location: [place] CV [facility];  Service:  Cardiovascular;  Laterality: N/A;  . CHOLECYSTECTOMY    . ESOPHAGOGASTRODUODENOSCOPY N/A 04/04/2015   Procedure: ESOPHAGOGASTRODUODENOSCOPY (EGD);  Surgeon: Hulen Luster, MD;  Location: [facility] ENDOSCOPY;  Service: Endoscopy;  Laterality: N/A;  . ESOPHAGOGASTRODUODENOSCOPY (EGD) WITH PROPOFOL N/A 01/18/2016   Procedure: ESOPHAGOGASTRODUODENOSCOPY (EGD) WITH PROPOFOL;  Surgeon: Lucilla Lame, MD;  Location: [facility] ENDOSCOPY;  Service: Endoscopy;  Laterality: N/A;  . FLEXIBLE SIGMOIDOSCOPY N/A 01/18/2016   Procedure: FLEXIBLE SIGMOIDOSCOPY;  Surgeon: Lucilla Lame, MD;  Location: [facility] ENDOSCOPY;  Service: Endoscopy;  Laterality: N/A;  . HERNIA REPAIR      Prior to Admission medications   Medication Sig Start Date End Date Taking? Authorizing Provider  acidophilus (RISAQUAD) CAPS capsule Take 2 capsules by mouth daily. 01/25/16  Yes Sital Mody, MD  albuterol (PROVENTIL HFA;VENTOLIN HFA) 108 (90 Base) MCG/ACT inhaler Inhale 2 puffs into the lungs every 6 (six) hours as needed for wheezing or shortness of breath. 05/15/16  Yes Epifanio Lesches, MD  apixaban (ELIQUIS) 5 MG TABS tablet Take 1 tablet (5 mg total) by mouth 2 (two) times daily. 05/15/16  Yes Epifanio Lesches, MD  atorvastatin (LIPITOR) 40 MG tablet Take 1 tablet (40 mg total) by mouth at bedtime. 05/15/16  Yes Epifanio Lesches, MD  budesonide-formoterol (SYMBICORT) 160-4.5 MCG/ACT inhaler Inhale 2 puffs into the lungs 2 (two) times daily. 05/15/16  Yes Epifanio Lesches, MD  dicyclomine (BENTYL) 10 MG capsule Take 1 capsule (10 mg total) by mouth 4 (four) times daily -  before meals and at bedtime. 06/06/16 07/06/16 Yes Lytle Butte, MD  insulin aspart (NOVOLOG) 100 UNIT/ML injection Inject 5 Units into the skin 3 (three) times daily with meals as needed for high blood sugar. Pt uses as needed per sliding scale. 05/15/16  Yes Epifanio Lesches, MD  insulin detemir (LEVEMIR) 100 UNIT/ML injection Inject 0.05 mLs (5 Units total) into the skin at  bedtime. Patient taking differently: Inject 17 Units into the skin at bedtime.  05/15/16  Yes Epifanio Lesches, MD  levothyroxine (SYNTHROID, LEVOTHROID) 75 MCG tablet Take 1 tablet (75 mcg total) by mouth daily before breakfast. 05/15/16  Yes Epifanio Lesches, MD  metoCLOPramide (REGLAN) 10 MG tablet Take 1 tablet (10 mg total) by mouth 4 (four) times daily -  before meals and at bedtime. 05/15/16  Yes Epifanio Lesches, MD  pantoprazole (PROTONIX) 40 MG tablet Take 1 tablet (40 mg total) by mouth 2 (two) times daily before a meal. 05/15/16  Yes Epifanio Lesches, MD  rifaximin (XIFAXAN) 550 MG TABS tablet Take 1 tablet (550 mg total) by mouth 2 (two) times daily. 05/15/16  Yes Epifanio Lesches, MD  spironolactone (ALDACTONE) 25 MG tablet Take 1 tablet (25 mg total) by mouth daily. 05/15/16  Yes Epifanio Lesches, MD  bismuth subsalicylate (PEPTO BISMOL) 262 MG/15ML suspension Take 30 mLs by mouth every 4 (four) hours as needed for diarrhea or loose stools. 01/25/16   Bettey Costa, MD  diphenhydrAMINE (BENADRYL) 25 mg capsule Take 2 capsules (50 mg total) by mouth every 6 (six) hours as needed. Patient taking differently: Take 50 mg by  mouth every 6 (six) hours as needed for itching or allergies.  02/09/16   Carrie Mew, MD  feeding supplement, GLUCERNA SHAKE, (GLUCERNA SHAKE) LIQD Take 237 mLs by mouth 3 (three) times daily between meals. 05/15/16   Epifanio Lesches, MD  morphine (MS CONTIN) 30 MG 12 hr tablet Take 1 tablet (30 mg total) by mouth every 8 (eight) hours. 06/06/16 06/21/16  Lytle Butte, MD  ondansetron (ZOFRAN ODT) 4 MG disintegrating tablet Take 1 tablet (4 mg total) by mouth every 8 (eight) hours as needed for nausea or vomiting. 05/15/16   Epifanio Lesches, MD  oxyCODONE (OXY IR/ROXICODONE) 5 MG immediate release tablet Take 1 tablet (5 mg total) by mouth every 4 (four) hours as needed for moderate pain. 06/06/16   Lytle Butte, MD  tiZANidine (ZANAFLEX) 4 MG tablet Take 1  tablet (4 mg total) by mouth 3 (three) times daily as needed for muscle spasms. 05/15/16   Epifanio Lesches, MD    Allergies  Allergen Reactions  . Hydrocodone Other (See Comments)    Pt states that this medication caused cirrhosis of the liver.    . Aspirin   . Erythromycin Other (See Comments)    Reaction:  Fever   . Prednisone Other (See Comments)    Reaction:  Unknown   . Rosiglitazone Maleate Swelling  . Codeine Sulfate Rash  . Tetanus-Diphtheria Toxoids Td Rash and Other (See Comments)    Reaction:  Fever     Family History  Problem Relation Age of Onset  . Hypertension Mother   . CAD Sister   . Heart attack Sister     Deceased 16-Nov-2014  . CAD Brother     Social History Social History  Substance Use Topics  . Smoking status: Never Smoker  . Smokeless tobacco: Never Used  . Alcohol use No    Review of Systems  Constitutional: Negative for fever. Eyes: Negative for visual changes. ENT: Negative for sore throat. Cardiovascular: Negative for chest pain. Respiratory: Negative for shortness of breath. Gastrointestinal:Positive as per history of present illness Genitourinary: Negative for dysuria. Musculoskeletal: Negative for back pain. Skin: Negative for rash. Neurological: Negative for headache. 10 point Review of Systems otherwise negative ____________________________________________   PHYSICAL EXAM:  VITAL SIGNS: ED Triage Vitals  Enc Vitals Group     BP 06/17/16 1800 (!) 164/96     Pulse Rate 06/17/16 1751 (!) 112     Resp 06/17/16 1751 17     Temp 06/17/16 1751 98.5 F (36.9 C)     Temp Source 06/17/16 1751 Oral     SpO2 06/17/16 1751 98 %     Weight 06/17/16 1753 135 lb (61.2 kg)     Height 06/17/16 1753 5' 3"  (1.6 m)     Head Circumference --      Peak Flow --      Pain Score 06/17/16 1753 10     Pain Loc --      Pain Edu? --      Excl. in [place]? --      Constitutional: Alert and oriented. Looks like she doesn't feel well and is holding  her emesis bag. HEENT   Head: Normocephalic and atraumatic.      Eyes: Conjunctivae are normal. PERRL. Normal extraocular movements.      Ears:         Nose: No congestion/rhinnorhea.   Mouth/Throat: Mucous membranes are moderately dry.   Neck: No stridor. Cardiovascular/Chest: Tachycardic, regular rhythm.  No murmurs,  rubs, or gallops. Respiratory: Normal respiratory effort without tachypnea nor retractions. Breath sounds are clear and equal bilaterally. No wheezes/rales/rhonchi. Gastrointestinal: Soft. No distention, no guarding, no rebound. Moderate tenderness mid abdomen and epigastrium over to the left side. Genitourinary/rectal:Deferred Musculoskeletal: Nontender with normal range of motion in all extremities. No joint effusions.  No lower extremity tenderness.  No edema. Neurologic:  Normal speech and language. No gross or focal neurologic deficits are appreciated. Skin:  Skin is warm, dry and intact. No rash noted. Psychiatric: Mood and affect are normal. Speech and behavior are normal. Patient exhibits appropriate insight and judgment.   ____________________________________________  LABS (pertinent positives/negatives)  Labs Reviewed  COMPREHENSIVE METABOLIC PANEL - Abnormal; Notable for the following:       Result Value   Sodium 134 (*)    Potassium 3.3 (*)    Glucose, Bld 226 (*)    Calcium 8.6 (*)    All other components within normal limits  CBC - Abnormal; Notable for the following:    Hemoglobin 10.0 (*)    HCT 30.9 (*)    MCV 77.2 (*)    MCH 24.9 (*)    RDW 18.9 (*)    All other components within normal limits  LIPASE, BLOOD  URINALYSIS COMPLETEWITH MICROSCOPIC ([facility] ONLY)    ____________________________________________    EKG I, Lisa Roca, MD, the attending physician have personally viewed and interpreted all ECGs.  113 bpm. Sinus tachycardia. Nonspecific intraventricular conduction delay. Nonspecific ST and  T-wave ____________________________________________  RADIOLOGY All Xrays were viewed by me. Imaging interpreted by Radiologist.  None __________________________________________  PROCEDURES  Procedure(s) performed: None  Critical Care performed: None  ____________________________________________   ED COURSE / ASSESSMENT AND PLAN  Pertinent labs & imaging results that were available during my care of the patient were reviewed by me and considered in my medical decision making (see chart for details).   Ms. Rettig is a history of gastroparesis, and her symptoms today are probably related to gastroparesis episode. Typically she does have vomiting diarrhea as well as abdominal pain. On exam today, there is no guarding or rebound, no black or bloody stools reported. No chest pain or trouble breathing. Given this is similar to prior episodes and her laboratory studies are reassuring, I am not recommending additional imaging at this point in time. I am treating her symptomatically for gastroparesis. No DKA today.  She was given IV hydration and pain and nausea medications.  After several doses of symptomatic medication, patient still nauseated, I will admit to the hospitalist for observation admission.    CONSULTATIONS:  Hospitalist for admission.   Patient / Family / Caregiver informed of clinical course, medical decision-making process, and agree with plan.    ___________________________________________   FINAL CLINICAL IMPRESSION(S) / ED DIAGNOSES   Final diagnoses:  Nausea vomiting and diarrhea  Gastroparesis              Note: This dictation was prepared with Dragon dictation. Any transcriptional errors that result from this process are unintentional    Lisa Roca, MD 06/17/16 2121

## 2016-06-17 NOTE — ED Triage Notes (Addendum)
Per [facility], patient comes from home. Per EMS patient calls 911, 1x weekly for similar/same complaints. Patient states this has been going on for 3 years but her pain is unbearable at this time. EMS gave 36m Zofran without relief. Patient A&O x4.

## 2016-06-17 NOTE — Care Management Obs Status (Signed)
[place] NOTIFICATION   Patient Details  Name: [name] MRN: [id number] Date of Birth: [birth year]   Medicare Observation Status Notification Given:  Yes    CrutchfieldAntony Haste, RN 06/17/2016, 10:14 PM

## 2016-06-17 NOTE — H&P (Signed)
[name] at [place] NAME: [name]    MR#:  [id number]  DATE OF BIRTH:  1956-04-14  DATE OF ADMISSION:  06/17/2016  PRIMARY CARE PHYSICIAN: No PCP Per Patient   REQUESTING/REFERRING PHYSICIAN: Reita Cliche, M.D.  CHIEF COMPLAINT:   Chief Complaint  Patient presents with  . Abdominal Pain    HISTORY OF PRESENT ILLNESS:  [name]  is a 60 y.o. female who presents with Abdominal pain as well as nausea and vomiting. Patient has a known history of gastroparesis and has been hospitalized for the same before. At times in past she has had gastroparesis in conjunction with DKA. In the ED today she was found not to be in DKA, however her symptoms of gastroparesis were not well-controlled initially with antiemetics. Hospitalists were called for admission and further treatment. Of note, patient has a history of prolonged QT, but has received Reglan previously to treat gastroparesis and not had any issues with this medication.  PAST MEDICAL HISTORY:   Past Medical History:  Diagnosis Date  . Allergy   . Anxiety   . Cancer (HCC)    HX OF CANCER OF UTERUS   . Cirrhosis of liver not due to alcohol ([place]) November 18, 2014  . Degenerative disk disease   . Diverticulitis   . Gastroparesis   . GERD (gastroesophageal reflux disease)   . History of hiatal hernia   . Hypertension   . Hypothyroid   . Intussusception intestine ([place]) 05/2015  . PAF (paroxysmal atrial fibrillation) ([place]) 03/2015   a. new onset 03/2015 in setting of intractable N/V; b. on Eliquis 5 mg bid; c. CHADSVASc 4 (DM, TIA x 2, female)  . Pancreatitis   . Sick sinus syndrome ([place])   . Stomach ulcer   . Stroke [facility])    with minimal left sided weakness  . Syncope 01/2015  . TIA (transient ischemic attack) 02/2015  . Type 1 diabetes ([place])    on levemir    PAST SURGICAL HISTORY:   Past Surgical History:  Procedure Laterality Date  . ABDOMINAL HYSTERECTOMY    . CARDIAC  CATHETERIZATION N/A 01/12/2016   Procedure: Left Heart Cath and Coronary Angiography;  Surgeon: Wellington Hampshire, MD;  Location: [place] [facility] LAB;  Service: Cardiovascular;  Laterality: N/A;  . CHOLECYSTECTOMY    . ESOPHAGOGASTRODUODENOSCOPY N/A 04/04/2015   Procedure: ESOPHAGOGASTRODUODENOSCOPY (EGD);  Surgeon: Hulen Luster, MD;  Location: [facility] ENDOSCOPY;  Service: Endoscopy;  Laterality: N/A;  . ESOPHAGOGASTRODUODENOSCOPY (EGD) WITH PROPOFOL N/A 01/18/2016   Procedure: ESOPHAGOGASTRODUODENOSCOPY (EGD) WITH PROPOFOL;  Surgeon: Lucilla Lame, MD;  Location: [facility] ENDOSCOPY;  Service: Endoscopy;  Laterality: N/A;  . FLEXIBLE SIGMOIDOSCOPY N/A 01/18/2016   Procedure: FLEXIBLE SIGMOIDOSCOPY;  Surgeon: Lucilla Lame, MD;  Location: [facility] ENDOSCOPY;  Service: Endoscopy;  Laterality: N/A;  . HERNIA REPAIR      SOCIAL HISTORY:   Social History  Substance Use Topics  . Smoking status: Never Smoker  . Smokeless tobacco: Never Used  . Alcohol use No    FAMILY HISTORY:   Family History  Problem Relation Age of Onset  . Hypertension Mother   . CAD Sister   . Heart attack Sister     Deceased 11/18/14  . CAD Brother     DRUG ALLERGIES:   Allergies  Allergen Reactions  . Hydrocodone Other (See Comments)    Pt states that this medication caused cirrhosis of the liver.    . Aspirin   . Erythromycin Other (  See Comments)    Reaction:  Fever   . Prednisone Other (See Comments)    Reaction:  Unknown   . Rosiglitazone Maleate Swelling  . Codeine Sulfate Rash  . Tetanus-Diphtheria Toxoids Td Rash and Other (See Comments)    Reaction:  Fever     MEDICATIONS AT HOME:   Prior to Admission medications   Medication Sig Start Date End Date Taking? Authorizing Provider  acidophilus (RISAQUAD) CAPS capsule Take 2 capsules by mouth daily. 01/25/16  Yes Sital Mody, MD  albuterol (PROVENTIL HFA;VENTOLIN HFA) 108 (90 Base) MCG/ACT inhaler Inhale 2 puffs into the lungs every 6 (six) hours as needed for wheezing  or shortness of breath. 05/15/16  Yes Epifanio Lesches, MD  apixaban (ELIQUIS) 5 MG TABS tablet Take 1 tablet (5 mg total) by mouth 2 (two) times daily. 05/15/16  Yes Epifanio Lesches, MD  atorvastatin (LIPITOR) 40 MG tablet Take 1 tablet (40 mg total) by mouth at bedtime. 05/15/16  Yes Epifanio Lesches, MD  budesonide-formoterol (SYMBICORT) 160-4.5 MCG/ACT inhaler Inhale 2 puffs into the lungs 2 (two) times daily. 05/15/16  Yes Epifanio Lesches, MD  dicyclomine (BENTYL) 10 MG capsule Take 1 capsule (10 mg total) by mouth 4 (four) times daily -  before meals and at bedtime. 06/06/16 07/06/16 Yes Lytle Butte, MD  insulin aspart (NOVOLOG) 100 UNIT/ML injection Inject 5 Units into the skin 3 (three) times daily with meals as needed for high blood sugar. Pt uses as needed per sliding scale. 05/15/16  Yes Epifanio Lesches, MD  insulin detemir (LEVEMIR) 100 UNIT/ML injection Inject 0.05 mLs (5 Units total) into the skin at bedtime. Patient taking differently: Inject 17 Units into the skin at bedtime.  05/15/16  Yes Epifanio Lesches, MD  levothyroxine (SYNTHROID, LEVOTHROID) 75 MCG tablet Take 1 tablet (75 mcg total) by mouth daily before breakfast. 05/15/16  Yes Epifanio Lesches, MD  metoCLOPramide (REGLAN) 10 MG tablet Take 1 tablet (10 mg total) by mouth 4 (four) times daily -  before meals and at bedtime. 05/15/16  Yes Epifanio Lesches, MD  pantoprazole (PROTONIX) 40 MG tablet Take 1 tablet (40 mg total) by mouth 2 (two) times daily before a meal. 05/15/16  Yes Epifanio Lesches, MD  rifaximin (XIFAXAN) 550 MG TABS tablet Take 1 tablet (550 mg total) by mouth 2 (two) times daily. 05/15/16  Yes Epifanio Lesches, MD  spironolactone (ALDACTONE) 25 MG tablet Take 1 tablet (25 mg total) by mouth daily. 05/15/16  Yes Epifanio Lesches, MD  bismuth subsalicylate (PEPTO BISMOL) 262 MG/15ML suspension Take 30 mLs by mouth every 4 (four) hours as needed for diarrhea or loose stools. 01/25/16   Bettey Costa,  MD  diphenhydrAMINE (BENADRYL) 25 mg capsule Take 2 capsules (50 mg total) by mouth every 6 (six) hours as needed. Patient taking differently: Take 50 mg by mouth every 6 (six) hours as needed for itching or allergies.  02/09/16   Carrie Mew, MD  feeding supplement, GLUCERNA SHAKE, (GLUCERNA SHAKE) LIQD Take 237 mLs by mouth 3 (three) times daily between meals. 05/15/16   Epifanio Lesches, MD  morphine (MS CONTIN) 30 MG 12 hr tablet Take 1 tablet (30 mg total) by mouth every 8 (eight) hours. 06/06/16 06/21/16  Lytle Butte, MD  ondansetron (ZOFRAN ODT) 4 MG disintegrating tablet Take 1 tablet (4 mg total) by mouth every 8 (eight) hours as needed for nausea or vomiting. 05/15/16   Epifanio Lesches, MD  oxyCODONE (OXY IR/ROXICODONE) 5 MG immediate release tablet Take 1 tablet (  5 mg total) by mouth every 4 (four) hours as needed for moderate pain. 06/06/16   Lytle Butte, MD  tiZANidine (ZANAFLEX) 4 MG tablet Take 1 tablet (4 mg total) by mouth 3 (three) times daily as needed for muscle spasms. 05/15/16   Epifanio Lesches, MD    REVIEW OF SYSTEMS:  Review of Systems  Constitutional: Negative for chills, fever, malaise/fatigue and weight loss.  HENT: Negative for ear pain, hearing loss and tinnitus.   Eyes: Negative for blurred vision, double vision, pain and redness.  Respiratory: Negative for cough, hemoptysis and shortness of breath.   Cardiovascular: Negative for chest pain, palpitations, orthopnea and leg swelling.  Gastrointestinal: Positive for abdominal pain, nausea and vomiting. Negative for constipation and diarrhea.  Genitourinary: Negative for dysuria, frequency and hematuria.  Musculoskeletal: Negative for back pain, joint pain and neck pain.  Skin:       No acne, rash, or lesions  Neurological: Negative for dizziness, tremors, focal weakness and weakness.  Endo/Heme/Allergies: Negative for polydipsia. Does not bruise/bleed easily.  Psychiatric/Behavioral: Negative for  depression. The patient is not nervous/anxious and does not have insomnia.      VITAL SIGNS:   Vitals:   06/17/16 2000 06/17/16 2030 06/17/16 2100 06/17/16 2130  BP: (!) 160/83 (!) 163/89 (!) 158/84 (!) 156/88  Pulse: (!) 116 (!) 113 (!) 116 (!) 113  Resp: 20 17  17   Temp:      TempSrc:      SpO2: 100% 100% 100% 100%  Weight:      Height:       Wt Readings from Last 3 Encounters:  06/17/16 61.2 kg (135 lb)  06/06/16 64.4 kg (141 lb 14.4 oz)  05/12/16 58.4 kg (128 lb 12.8 oz)    PHYSICAL EXAMINATION:  Physical Exam  Vitals reviewed. Constitutional: She is oriented to person, place, and time. She appears well-developed and well-nourished. No distress.  HENT:  Head: Normocephalic and atraumatic.  Mouth/Throat: Oropharynx is clear and moist.  Eyes: Conjunctivae and EOM are normal. Pupils are equal, round, and reactive to light. No scleral icterus.  Neck: Normal range of motion. Neck supple. No JVD present. No thyromegaly present.  Cardiovascular: Normal rate, regular rhythm and intact distal pulses.  Exam reveals no gallop and no friction rub.   No murmur heard. Respiratory: Effort normal and breath sounds normal. No respiratory distress. She has no wheezes. She has no rales.  GI: Soft. Bowel sounds are normal. She exhibits no distension. There is tenderness.  Musculoskeletal: Normal range of motion. She exhibits no edema.  No arthritis, no gout  Lymphadenopathy:    She has no cervical adenopathy.  Neurological: She is alert and oriented to person, place, and time. No cranial nerve deficit.  No dysarthria, no aphasia  Skin: Skin is warm and dry. No rash noted. No erythema.  Psychiatric: She has a normal mood and affect. Her behavior is normal. Judgment and thought content normal.    LABORATORY PANEL:   CBC  Recent Labs Lab 06/17/16 1757  WBC 5.2  HGB 10.0*  HCT 30.9*  PLT 189    ------------------------------------------------------------------------------------------------------------------  Chemistries   Recent Labs Lab 06/17/16 1757  NA 134*  K 3.3*  CL 106  CO2 23  GLUCOSE 226*  BUN 14  CREATININE 0.52  CALCIUM 8.6*  AST 22  ALT 17  ALKPHOS 110  BILITOT 0.9   ------------------------------------------------------------------------------------------------------------------  Cardiac Enzymes No results for input(s): TROPONINI in the last 168 hours. ------------------------------------------------------------------------------------------------------------------  RADIOLOGY:  No results found.  EKG:   Orders placed or performed during the hospital encounter of 06/17/16  . ED EKG  . ED EKG  . EKG 12-Lead  . EKG 12-Lead    IMPRESSION AND PLAN:  Principal Problem:   Gastroparesis - IV fluids, when necessary antiemetics, Reglan scheduled. Given the patient's QT prolongation we will get EKG in the morning for QTC monitoring. Active Problems:   DEPRESSION/ANXIETY - continue home meds   Essential hypertension - currently stable, continue home meds   Uncontrolled type 2 diabetes mellitus ([place]) - sliding scale insulin with corresponding glucose checks   Paroxysmal atrial fibrillation (HCC) - continue home meds including anticoagulation   GERD - home dose PPI  All the records are reviewed and case discussed with ED provider. Management plans discussed with the patient and/or family.  DVT PROPHYLAXIS: Systemic anticoagulation  GI PROPHYLAXIS: PPI  ADMISSION STATUS: Inpatient  CODE STATUS: FULL (Patient has had prior DO NOT RESUSCITATE orders placed, but after conversation with her tonight she states that she would wish to be full CODE STATUS.) Code Status History    Date Active Date Inactive Code Status Order ID Comments User Context   06/02/2016  5:46 AM 06/06/2016  6:21 PM DNR [id number]  Harrie Foreman, MD Inpatient   05/12/2016 11:43 AM  05/16/2016  4:38 PM DNR [id number]  Dustin Flock, MD Inpatient   05/12/2016  1:13 AM 05/12/2016 11:43 AM Full Code [id number]  Harvie Bridge, DO Inpatient   04/07/2016  6:18 AM 04/09/2016  2:19 PM DNR [id number]  Harrie Foreman, MD Inpatient   03/13/2016  9:25 PM 03/16/2016  6:32 PM DNR [id number]  Lance Coon, MD Inpatient   02/04/2016  9:39 AM 02/07/2016  2:59 PM DNR [id number]  Saundra Shelling, MD Inpatient   01/21/2016 10:24 PM 01/25/2016  5:02 PM DNR [id number]  Lance Coon, MD Inpatient   01/15/2016  1:39 PM 01/19/2016  3:29 PM DNR [id number]  Gladstone Lighter, MD ED   01/10/2016 10:00 PM 01/13/2016  5:24 PM DNR [id number]  Harrie Foreman, MD Inpatient   12/27/2015 11:02 PM 01/01/2016  6:17 PM DNR [id number]  Max Sane, MD Inpatient   12/16/2015  4:54 PM 12/19/2015  4:52 PM DNR [id number]  Loletha Grayer, MD ED   11/11/2015 11:36 AM 11/16/2015  2:05 PM Full Code [id number]  Samella Parr, NP Inpatient   10/15/2015  1:23 AM 10/16/2015  6:17 PM Full Code [id number]  Lance Coon, MD Inpatient   10/04/2015  8:19 PM 10/08/2015  6:34 PM Full Code [id number]  Bethena Roys, MD Inpatient   08/15/2015  1:05 AM 08/16/2015  8:04 PM Full Code [id number]  Harrie Foreman, MD Inpatient   08/01/2015  1:25 AM 08/06/2015  5:41 PM Full Code [id number]  Harrie Foreman, MD Inpatient   07/04/2015  2:53 AM 07/11/2015  6:33 PM Full Code [id number]  Lytle Butte, MD ED   05/27/2015 10:48 PM 05/31/2015  2:37 PM Full Code [id number]  Rise Patience, MD Inpatient   05/19/2015  1:14 AM 05/22/2015  4:28 PM Full Code [id number]  Rise Patience, MD Inpatient   04/05/2015  9:16 AM 04/07/2015  5:23 PM Full Code [id number]  Bettey Costa, MD Inpatient   04/03/2015 12:31 PM 04/05/2015  9:16 AM Full Code [id number]  Bettey Costa, MD Inpatient   03/11/2015  4:49 PM 03/14/2015  3:50 PM Full Code [id number]  Dustin Flock, MD Inpatient   02/15/2015  4:51 PM 02/17/2015  2:33  PM DNR [id number]  Dustin Flock, MD Inpatient   02/15/2015  4:35 PM 02/15/2015  4:51 PM Full Code  [id number]  Dustin Flock, MD Inpatient   01/11/2015  2:40 AM 01/12/2015  6:11 PM Full Code [id number]  Etta Quill, DO ED    Questions for Most Recent Historical Code Status (Order [id number])    Question Answer Comment   In the event of cardiac or respiratory ARREST Do not call a "code blue"    In the event of cardiac or respiratory ARREST Do not perform Intubation, CPR, defibrillation or ACLS    In the event of cardiac or respiratory ARREST Use medication by any route, position, wound care, and other measures to relive pain and suffering. May use oxygen, suction and manual treatment of airway obstruction as needed for comfort.         Advance Directive Documentation   Flowsheet Row Most Recent Value  Type of Advance Directive  Healthcare Power of Attorney, Living will  Pre-existing out of facility DNR order (yellow form or pink MOST form)  No data  "MOST" Form in Place?  No data      TOTAL TIME TAKING CARE OF THIS PATIENT: 40 minutes.    Layonna Dobie FIELDING 06/17/2016, 9:59 PM  [employer] [facility]  Office  [phone]  CC: Primary care physician; No PCP Per Patient

## 2016-06-17 NOTE — ED Notes (Signed)
Patient states she has vomited 15 times and has had 20 diarrheal episodes every day for the past week.

## 2016-06-18 DIAGNOSIS — I480 Paroxysmal atrial fibrillation: Secondary | ICD-10-CM | POA: Diagnosis not present

## 2016-06-18 DIAGNOSIS — I10 Essential (primary) hypertension: Secondary | ICD-10-CM | POA: Diagnosis not present

## 2016-06-18 DIAGNOSIS — K3184 Gastroparesis: Secondary | ICD-10-CM | POA: Diagnosis not present

## 2016-06-18 DIAGNOSIS — E119 Type 2 diabetes mellitus without complications: Secondary | ICD-10-CM | POA: Diagnosis not present

## 2016-06-18 LAB — CBC
HEMATOCRIT: 24.5 % — AB (ref 35.0–47.0)
HEMOGLOBIN: 8 g/dL — AB (ref 12.0–16.0)
MCH: 25.1 pg — AB (ref 26.0–34.0)
MCHC: 32.6 g/dL (ref 32.0–36.0)
MCV: 76.9 fL — ABNORMAL LOW (ref 80.0–100.0)
Platelets: 128 10*3/uL — ABNORMAL LOW (ref 150–440)
RBC: 3.18 MIL/uL — AB (ref 3.80–5.20)
RDW: 18.9 % — ABNORMAL HIGH (ref 11.5–14.5)
WBC: 3.7 10*3/uL (ref 3.6–11.0)

## 2016-06-18 LAB — URINALYSIS COMPLETE WITH MICROSCOPIC (ARMC ONLY)
BACTERIA UA: NONE SEEN
BILIRUBIN URINE: NEGATIVE
GLUCOSE, UA: NEGATIVE mg/dL
Ketones, ur: NEGATIVE mg/dL
Nitrite: NEGATIVE
PH: 6 (ref 5.0–8.0)
Protein, ur: 30 mg/dL — AB
Specific Gravity, Urine: 1.012 (ref 1.005–1.030)

## 2016-06-18 LAB — BASIC METABOLIC PANEL
ANION GAP: 4 — AB (ref 5–15)
BUN: 14 mg/dL (ref 6–20)
CALCIUM: 7.4 mg/dL — AB (ref 8.9–10.3)
CHLORIDE: 113 mmol/L — AB (ref 101–111)
CO2: 21 mmol/L — AB (ref 22–32)
Creatinine, Ser: 0.5 mg/dL (ref 0.44–1.00)
GFR calc non Af Amer: >60 mL/min (ref >60)
Glucose, Bld: 117 mg/dL — ABNORMAL HIGH (ref 65–99)
POTASSIUM: 3 mmol/L — AB (ref 3.5–5.1)
Sodium: 138 mmol/L (ref 135–145)

## 2016-06-18 LAB — GLUCOSE, CAPILLARY
GLUCOSE-CAPILLARY: 122 mg/dL — AB (ref 65–99)
GLUCOSE-CAPILLARY: 171 mg/dL — AB (ref 65–99)
Glucose-Capillary: 124 mg/dL — ABNORMAL HIGH (ref 65–99)
Glucose-Capillary: 182 mg/dL — ABNORMAL HIGH (ref 65–99)

## 2016-06-18 LAB — LIPASE, BLOOD: LIPASE: 20 U/L (ref 11–51)

## 2016-06-18 LAB — MAGNESIUM: MAGNESIUM: 1.4 mg/dL — AB (ref 1.7–2.4)

## 2016-06-18 MED ORDER — RIFAXIMIN 550 MG PO TABS
550.0000 mg | ORAL_TABLET | Freq: Two times a day (BID) | ORAL | Status: DC
  Administered 2016-06-18 – 2016-06-20 (×6): 550 mg via ORAL
  Filled 2016-06-18 (×6): qty 1

## 2016-06-18 MED ORDER — MAGNESIUM SULFATE 4 GM/100ML IV SOLN
4.0000 g | Freq: Once | INTRAVENOUS | Status: AC
  Administered 2016-06-18: 4 g via INTRAVENOUS
  Filled 2016-06-18: qty 100

## 2016-06-18 MED ORDER — LEVOTHYROXINE SODIUM 75 MCG PO TABS
75.0000 ug | ORAL_TABLET | Freq: Every day | ORAL | Status: DC
  Administered 2016-06-18 – 2016-06-20 (×3): 75 ug via ORAL
  Filled 2016-06-18 (×3): qty 1

## 2016-06-18 MED ORDER — SPIRONOLACTONE 25 MG PO TABS
25.0000 mg | ORAL_TABLET | Freq: Every day | ORAL | Status: DC
  Administered 2016-06-18 – 2016-06-20 (×3): 25 mg via ORAL
  Filled 2016-06-18 (×3): qty 1

## 2016-06-18 MED ORDER — POTASSIUM CHLORIDE CRYS ER 20 MEQ PO TBCR
40.0000 meq | EXTENDED_RELEASE_TABLET | Freq: Once | ORAL | Status: AC
  Administered 2016-06-18: 40 meq via ORAL
  Filled 2016-06-18: qty 2

## 2016-06-18 MED ORDER — PANTOPRAZOLE SODIUM 40 MG PO TBEC
40.0000 mg | DELAYED_RELEASE_TABLET | Freq: Two times a day (BID) | ORAL | Status: DC
  Administered 2016-06-18 – 2016-06-20 (×5): 40 mg via ORAL
  Filled 2016-06-18 (×5): qty 1

## 2016-06-18 MED ORDER — SODIUM CHLORIDE 0.9 % IV SOLN
INTRAVENOUS | Status: AC
  Administered 2016-06-18: 12:00:00 via INTRAVENOUS

## 2016-06-18 NOTE — Progress Notes (Signed)
Initial Nutrition Assessment  DOCUMENTATION CODES:   Non-severe (moderate) malnutrition in context of chronic illness  INTERVENTION:  -Monitor diet progression and intake -Pt has been educated on past admissions, no new education needs at this time -Recommend glucerna TID for added nutrition once diet progressed past clear liquids.    NUTRITION DIAGNOSIS:   Malnutrition related to chronic illness as evidenced by mild depletion of body fat, mild depletion of muscle mass, energy intake < or equal to 75% for > or equal to 1 month.    GOAL:   Patient will meet greater than or equal to 90% of their needs    MONITOR:      REASON FOR ASSESSMENT:   Malnutrition Screening Tool    ASSESSMENT:      60 y.o female admitted with abdominal pain, nausea, vomiting, gastroparesis. Pt with history of HTN, DM, afib, GERD, cirrhosis of the liver, uterine cancer, pancreatitis, stroke.  Frequent admissions for gastroparesis, nausea, and vomiting.   Pt reports decreased intake for the past 5 days, has been trying to drink liquids but unable to tolerate solid foods.    Medications reviewed: reglan, protonix Labs reviewed: K 3.0, glucose 117, hgb 8.0  Nutrition-Focused physical exam completed. Findings are mild fat depletion, mild muscle depletion, and mild edema.    Diet Order:  Diet clear liquid Room service appropriate? Yes; Fluid consistency: Thin  Skin:  Reviewed, no issues  Last BM:  10/8  Height:   Ht Readings from Last 1 Encounters:  06/17/16 5' 3"  (1.6 m)    Weight: Pt reports wt loss but associated with fluid in extremities  6% wt loss in the last month  Wt Readings from Last 1 Encounters:  06/17/16 135 lb (61.2 kg)    Ideal Body Weight:     BMI:  Body mass index is 23.91 kg/m.  Estimated Nutritional Needs:   Kcal:  1500-1800 kcals/d  Protein:  73-92 g/d  Fluid:  >/= 1.5 L/d  EDUCATION NEEDS:   No education needs identified at this time  Chariti Havel B.  Zenia Resides, [place], [place] (pager) Weekend/On-Call pager [phone])

## 2016-06-18 NOTE — Progress Notes (Signed)
[place] [facility] [place] NAME: [name]    MRN#:  [id number]  DATE OF BIRTH:  1955-09-17  SUBJECTIVE:  Hospital Day: 0 days [name] is a 60 y.o. female presenting with Abdominal Pain .   Overnight events: Complaints of abdominal pain nodules Interval Events: Complaints of abdominal pain, nausea vomiting diarrhea-however no one has witnessed emesis or diarrhea  REVIEW OF SYSTEMS:  CONSTITUTIONAL: No fever, fatigue or weakness.  EYES: No blurred or double vision.  EARS, NOSE, AND THROAT: No tinnitus or ear pain.  RESPIRATORY: No cough, shortness of breath, wheezing or hemoptysis.  CARDIOVASCULAR: No chest pain, orthopnea, edema.  GASTROINTESTINAL: Positive nausea, vomiting, diarrhea or abdominal pain.  GENITOURINARY: No dysuria, hematuria.  ENDOCRINE: No polyuria, nocturia,  HEMATOLOGY: No anemia, easy bruising or bleeding SKIN: No rash or lesion. MUSCULOSKELETAL: No joint pain or arthritis.   NEUROLOGIC: No tingling, numbness, weakness.  PSYCHIATRY: No anxiety or depression.   DRUG ALLERGIES:   Allergies  Allergen Reactions  . Hydrocodone Other (See Comments)    Pt states that this medication caused cirrhosis of the liver.    . Aspirin   . Erythromycin Other (See Comments)    Reaction:  Fever   . Prednisone Other (See Comments)    Reaction:  Unknown   . Rosiglitazone Maleate Swelling  . Codeine Sulfate Rash  . Tetanus-Diphtheria Toxoids Td Rash and Other (See Comments)    Reaction:  Fever     VITALS:  Blood pressure 138/81, pulse (!) 110, temperature 98.3 F (36.8 C), temperature source Oral, resp. rate 18, height 5' 3"  (1.6 m), weight 61.2 kg (135 lb), SpO2 96 %.  PHYSICAL EXAMINATION:  VITAL SIGNS: Vitals:   06/17/16 2249 06/18/16 0409  BP: (!) 158/99 138/81  Pulse: (!) 102 (!) 110  Resp: 18 18  Temp: 98.8 F (37.1 C) 98.3 F (36.8 C)   GENERAL:60 y.o.female currently in no acute distress.  HEAD:  Normocephalic, atraumatic.  EYES: Pupils equal, round, reactive to light. Extraocular muscles intact. No scleral icterus.  MOUTH: Moist mucosal membrane. Dentition intact. No abscess noted.  EAR, NOSE, THROAT: Clear without exudates. No external lesions.  NECK: Supple. No thyromegaly. No nodules. No JVD.  PULMONARY: Clear to ascultation, without wheeze rails or rhonci. No use of accessory muscles, Good respiratory effort. good air entry bilaterally CHEST: Nontender to palpation.  CARDIOVASCULAR: S1 and S2. Regular rate and rhythm. No murmurs, rubs, or gallops. No edema. Pedal pulses 2+ bilaterally.  GASTROINTESTINAL: Soft, nontender, nondistended. No masses. Positive bowel sounds. No hepatosplenomegaly.  MUSCULOSKELETAL: No swelling, clubbing, or edema. Range of motion full in all extremities.  NEUROLOGIC: Cranial nerves II through XII are intact. No gross focal neurological deficits. Sensation intact. Reflexes intact.  SKIN: No ulceration, lesions, rashes, or cyanosis. Skin warm and dry. Turgor intact.  PSYCHIATRIC: Mood, affect within normal limits. The patient is awake, alert and oriented x 3. Insight, judgment intact.      LABORATORY PANEL:   CBC  Recent Labs Lab 06/18/16 0606  WBC 3.7  HGB 8.0*  HCT 24.5*  PLT 128*   ------------------------------------------------------------------------------------------------------------------  Chemistries   Recent Labs Lab 06/17/16 1757 06/18/16 0606  NA 134* 138  K 3.3* 3.0*  CL 106 113*  CO2 23 21*  GLUCOSE 226* 117*  BUN 14 14  CREATININE 0.52 0.50  CALCIUM 8.6* 7.4*  MG  --  1.4*  AST 22  --   ALT 17  --  ALKPHOS 110  --   BILITOT 0.9  --    ------------------------------------------------------------------------------------------------------------------  Cardiac Enzymes No results for input(s): TROPONINI in the last 168  hours. ------------------------------------------------------------------------------------------------------------------  RADIOLOGY:  No results found.  EKG:   Orders placed or performed during the hospital encounter of 06/17/16  . ED EKG  . ED EKG  . EKG 12-Lead  . EKG 12-Lead  . EKG 12-Lead  . EKG 12-Lead  . EKG 12-Lead  . EKG 12-Lead    ASSESSMENT AND PLAN:   [name] is a 60 y.o. female presenting with Abdominal Pain . Admitted 06/17/2016 : Day #: 0 days   1. Recurrent intractable abdominal pain: In the setting of gastroparesis, without much surprise patient ran out of pain medications 4 days ago because she does not follow up with PCP thus presents to the hospital yet again. On last discharge she had PCP appointment for October 2 of October 3 she states this had to be rescheduled. Advance diet, minimize narcotics  2. Essential hypertension: Continue home medications 3. Type 2 diabetes insulin requiring: Continue basal insulin sliding coverage  All the records are reviewed and case discussed with Care Management/Social Workerr. Management plans discussed with the patient, family and they are in agreement.  CODE STATUS: full TOTAL TIME TAKING CARE OF THIS PATIENT: 28 minutes.   POSSIBLE D/C IN 1DAYS, DEPENDING ON CLINICAL CONDITION.   Hower,  Karenann Cai.D on 06/18/2016 at 12:36 PM  Between 7am to 6pm - Pager - [phone]  After 6pm: House Pager: - [phone]  [facility]  Office  [phone]  CC: Primary care physician; No PCP Per Patient

## 2016-06-19 DIAGNOSIS — I10 Essential (primary) hypertension: Secondary | ICD-10-CM | POA: Diagnosis not present

## 2016-06-19 DIAGNOSIS — E119 Type 2 diabetes mellitus without complications: Secondary | ICD-10-CM | POA: Diagnosis not present

## 2016-06-19 DIAGNOSIS — I480 Paroxysmal atrial fibrillation: Secondary | ICD-10-CM | POA: Diagnosis not present

## 2016-06-19 DIAGNOSIS — K3184 Gastroparesis: Secondary | ICD-10-CM | POA: Diagnosis not present

## 2016-06-19 LAB — GLUCOSE, CAPILLARY
GLUCOSE-CAPILLARY: 224 mg/dL — AB (ref 65–99)
Glucose-Capillary: 129 mg/dL — ABNORMAL HIGH (ref 65–99)
Glucose-Capillary: 206 mg/dL — ABNORMAL HIGH (ref 65–99)
Glucose-Capillary: 165 mg/dL — ABNORMAL HIGH (ref 65–99)

## 2016-06-19 LAB — HEMOGLOBIN A1C
HEMOGLOBIN A1C: 8.1 % — AB (ref 4.8–5.6)
Mean Plasma Glucose: 186 mg/dL

## 2016-06-19 MED ORDER — LORAZEPAM 2 MG/ML IJ SOLN
2.0000 mg | Freq: Once | INTRAMUSCULAR | Status: AC
  Administered 2016-06-19: 2 mg via INTRAVENOUS
  Filled 2016-06-19: qty 1

## 2016-06-19 MED ORDER — OXYCODONE HCL 5 MG PO TABS
10.0000 mg | ORAL_TABLET | ORAL | Status: DC | PRN
  Administered 2016-06-19 – 2016-06-20 (×4): 10 mg via ORAL
  Filled 2016-06-19 (×4): qty 2

## 2016-06-19 MED ORDER — MORPHINE SULFATE (PF) 2 MG/ML IV SOLN
2.0000 mg | INTRAVENOUS | Status: DC | PRN
  Administered 2016-06-19: 2 mg via INTRAVENOUS
  Filled 2016-06-19: qty 1

## 2016-06-19 NOTE — Progress Notes (Signed)
ED registration called to let us know that pt was on the phone with spouse, but then it went silent. Pt is asleep in room, tele monitor is on pt. Will continue to monitor.

## 2016-06-19 NOTE — Progress Notes (Signed)
[facility] [place] NAME: [name]    MRN#:  [id number]  DATE OF BIRTH:  [birth year]  SUBJECTIVE:  Hospital Day: 0 days [name] is a 59 y.o. female presenting with Abdominal Pain .   Overnight events: Complaints of abdominal pain And nausea Interval Events: Complaints of abdominal pain, nausea vomiting diarrhea  REVIEW OF SYSTEMS:  CONSTITUTIONAL: No fever, fatigue or weakness.  EYES: No blurred or double vision.  EARS, NOSE, AND THROAT: No tinnitus or ear pain.  RESPIRATORY: No cough, shortness of breath, wheezing or hemoptysis.  CARDIOVASCULAR: No chest pain, orthopnea, edema.  GASTROINTESTINAL: Positive nausea, vomiting, diarrhea or abdominal pain.  GENITOURINARY: No dysuria, hematuria.  ENDOCRINE: No polyuria, nocturia,  HEMATOLOGY: No anemia, easy bruising or bleeding SKIN: No rash or lesion. MUSCULOSKELETAL: No joint pain or arthritis.   NEUROLOGIC: No tingling, numbness, weakness.  PSYCHIATRY: No anxiety or depression.   DRUG ALLERGIES:   Allergies  Allergen Reactions  . Hydrocodone Other (See Comments)    Pt states that this medication caused cirrhosis of the liver.    . Aspirin   . Erythromycin Other (See Comments)    Reaction:  Fever   . Prednisone Other (See Comments)    Reaction:  Unknown   . Rosiglitazone Maleate Swelling  . Codeine Sulfate Rash  . Tetanus-Diphtheria Toxoids Td Rash and Other (See Comments)    Reaction:  Fever     VITALS:  Blood pressure 127/72, pulse (!) 101, temperature 98 F (36.7 C), temperature source Oral, resp. rate 13, height 5' 3"  (1.6 m), weight 61.2 kg (135 lb), SpO2 96 %.  PHYSICAL EXAMINATION:  VITAL SIGNS: Vitals:   06/19/16 0513 06/19/16 1200  BP: (!) 148/80 127/72  Pulse: (!) 102 (!) 101  Resp: 19 13  Temp: 98.3 F (36.8 C) 98 F (36.7 C)   GENERAL:60 y.o.female currently in no acute distress.  HEAD: Normocephalic, atraumatic.  EYES: Pupils equal,  round, reactive to light. Extraocular muscles intact. No scleral icterus.  MOUTH: Moist mucosal membrane. Dentition intact. No abscess noted.  EAR, NOSE, THROAT: Clear without exudates. No external lesions.  NECK: Supple. No thyromegaly. No nodules. No JVD.  PULMONARY: Clear to ascultation, without wheeze rails or rhonci. No use of accessory muscles, Good respiratory effort. good air entry bilaterally CHEST: Nontender to palpation.  CARDIOVASCULAR: S1 and S2. Regular rate and rhythm. No murmurs, rubs, or gallops. No edema. Pedal pulses 2+ bilaterally.  GASTROINTESTINAL: Soft, nontender, nondistended. No masses. Positive bowel sounds. No hepatosplenomegaly.  MUSCULOSKELETAL: No swelling, clubbing, or edema. Range of motion full in all extremities.  NEUROLOGIC: Cranial nerves II through XII are intact. No gross focal neurological deficits. Sensation intact. Reflexes intact.  SKIN: No ulceration, lesions, rashes, or cyanosis. Skin warm and dry. Turgor intact.  PSYCHIATRIC: Mood, affect within normal limits. The patient is awake, alert and oriented x 3. Insight, judgment intact.      LABORATORY PANEL:   CBC  Recent Labs Lab 06/18/16 0606  WBC 3.7  HGB 8.0*  HCT 24.5*  PLT 128*   ------------------------------------------------------------------------------------------------------------------  Chemistries   Recent Labs Lab 06/17/16 1757 06/18/16 0606  NA 134* 138  K 3.3* 3.0*  CL 106 113*  CO2 23 21*  GLUCOSE 226* 117*  BUN 14 14  CREATININE 0.52 0.50  CALCIUM 8.6* 7.4*  MG  --  1.4*  AST 22  --   ALT 17  --   ALKPHOS 110  --  BILITOT 0.9  --    ------------------------------------------------------------------------------------------------------------------  Cardiac Enzymes No results for input(s): TROPONINI in the last 168 hours. ------------------------------------------------------------------------------------------------------------------  RADIOLOGY:  No  results found.  EKG:   Orders placed or performed during the hospital encounter of 06/17/16  . ED EKG  . ED EKG  . EKG 12-Lead  . EKG 12-Lead  . EKG 12-Lead  . EKG 12-Lead  . EKG 12-Lead  . EKG 12-Lead    ASSESSMENT AND PLAN:   [name] is a 60 y.o. female presenting with Abdominal Pain . Admitted 06/17/2016 : Day #: 0 days   1. Recurrent intractable abdominal pain: In the setting of gastroparesis: Continue pain medication did give a dose of IV morphine however patient then somnolent but avoid further IV doses, continue to advance diet  2. Essential hypertension: Continue home medications 3. Type 2 diabetes insulin requiring: Continue basal insulin sliding coverage  All the records are reviewed and case discussed with Care Management/Social Workerr. Management plans discussed with the patient, family and they are in agreement.  CODE STATUS: full TOTAL TIME TAKING CARE OF THIS PATIENT: 28 minutes.   POSSIBLE D/C IN 1DAYS, DEPENDING ON CLINICAL CONDITION.   Hower,  Karenann Cai.D on 06/19/2016 at 2:08 PM  Between 7am to 6pm - Pager - [phone]  After 6pm: House Pager: - [facility] [place] [facility]  Office  [phone]  CC: Primary care physician; No PCP Per Patient

## 2016-06-20 DIAGNOSIS — K3184 Gastroparesis: Secondary | ICD-10-CM | POA: Diagnosis not present

## 2016-06-20 DIAGNOSIS — E119 Type 2 diabetes mellitus without complications: Secondary | ICD-10-CM | POA: Diagnosis not present

## 2016-06-20 DIAGNOSIS — I480 Paroxysmal atrial fibrillation: Secondary | ICD-10-CM | POA: Diagnosis not present

## 2016-06-20 DIAGNOSIS — I10 Essential (primary) hypertension: Secondary | ICD-10-CM | POA: Diagnosis not present

## 2016-06-20 LAB — GLUCOSE, CAPILLARY: GLUCOSE-CAPILLARY: 244 mg/dL — AB (ref 65–99)

## 2016-06-20 MED ORDER — GLUCERNA SHAKE PO LIQD
237.0000 mL | Freq: Three times a day (TID) | ORAL | Status: DC
  Administered 2016-06-20: 237 mL via ORAL

## 2016-06-20 MED ORDER — MORPHINE SULFATE ER 30 MG PO TBCR: 30.0000 mg | EXTENDED_RELEASE_TABLET | Freq: Two times a day (BID) | ORAL | 0 refills | Status: DC

## 2016-06-20 MED ORDER — OXYCODONE HCL 5 MG PO TABS: 10.0000 mg | ORAL_TABLET | ORAL | 0 refills | Status: DC | PRN

## 2016-06-20 NOTE — Progress Notes (Signed)
Pt A and O x 4. VSS. Pt tolerating diet well. Minimal complaints of pain, no nausea. IV removed intact, prescriptions given. Pt voiced understanding of discharge instructions with no further questions. Pt left before wheelchair could make it up to the room. Case manager tried to get [facility]. Patient will call her new provider and see if she can get an appointment sooner once she gets home.

## 2016-06-20 NOTE — Progress Notes (Signed)
Notified dr.willis of pt dropping hot tea on herself by accident and there is a place on her arm that is red and not blistered. Acknowledged and no new orders. Cont to monitor.

## 2016-06-20 NOTE — Discharge Instructions (Signed)
Gastroparesis Gastroparesis, also called delayed gastric emptying, is a condition in which food takes longer than normal to empty from the stomach. The condition is usually long-lasting (chronic). CAUSES This condition may be caused by:  An endocrine disorder, such as hypothyroidism or diabetes. Diabetes is the most common cause of this condition.  A nervous system disease, such as Parkinson disease or multiple sclerosis.  Cancer, infection, or surgery of the stomach or vagus nerve.  A connective tissue disorder, such as scleroderma.  Certain medicines. In most cases, the cause is not known. RISK FACTORS This condition is more likely to develop in:  People with certain disorders, including endocrine disorders, eating disorders, amyloidosis, and scleroderma.  People with certain diseases, including Parkinson disease or multiple sclerosis.  People with cancer or infection of the stomach or vagus nerve.  People who have had surgery on the stomach or vagus nerve.  People who take certain medicines.  Women. SYMPTOMS Symptoms of this condition include:  An early feeling of fullness when eating.  Nausea.  Weight loss.  Vomiting.  Heartburn.  Abdominal bloating.  Inconsistent blood glucose levels.  Lack of appetite.  Acid from the stomach coming up into the esophagus (gastroesophageal reflux).  Spasms of the stomach. Symptoms may come and go. DIAGNOSIS This condition is diagnosed with tests, such as:  Tests that check how long it takes food to move through the stomach and intestines. These tests include:  Upper gastrointestinal (GI) series. In this test, X-rays of the intestines are taken after you drink a liquid. The liquid makes the intestines show up better on the X-rays.  Gastric emptying scintigraphy. In this test, scans are taken after you eat food that contains a small amount of radioactive material.  Wireless capsule GI monitoring system. This test  involves swallowing a capsule that records information about movement through the stomach.  Gastric manometry. This test measures electrical and muscular activity in the stomach. It is done with a thin tube that is passed down the throat and into the stomach.  Endoscopy. This test checks for abnormalities in the lining of the stomach. It is done with a long, thin tube that is passed down the throat and into the stomach.  An ultrasound. This test can help rule out gallbladder disease or pancreatitis as a cause of your symptoms. It uses sound waves to take pictures of the inside of your body. TREATMENT There is no cure for gastroparesis. This condition may be managed with:  Treatment of the underlying condition causing the gastroparesis.  Lifestyle changes, including exercise and dietary changes. Dietary changes can include:  Changes in what and when you eat.  Eating smaller meals more often.  Eating low-fat foods.  Eating low-fiber forms of high-fiber foods, such as cooked vegetables instead of raw vegetables.  Having liquid foods in place of solid foods. Liquid foods are easier to digest.  Medicines. These may be given to control nausea and vomiting and to stimulate stomach muscles.  Getting food through a feeding tube. This may be done in severe cases.  A gastric neurostimulator. This is a device that is inserted into the body with surgery. It helps improve stomach emptying and control nausea and vomiting. HOME CARE INSTRUCTIONS  Follow your health care provider's instructions about exercise and diet.  Take medicines only as directed by your health care provider. SEEK MEDICAL CARE IF:  Your symptoms do not improve with treatment.  You have new symptoms. SEEK IMMEDIATE MEDICAL CARE IF:  You have  severe abdominal pain that does not improve with treatment.  You have nausea that does not go away.  You cannot keep fluids down.   This information is not intended to replace  advice given to you by your health care provider. Make sure you discuss any questions you have with your health care provider.   Document Released: 08/27/2005 Document Revised: 01/11/2015 Document Reviewed: 08/23/2014 Elsevier Interactive Patient Education [employer].

## 2016-06-20 NOTE — Progress Notes (Signed)
Inpatient Diabetes Program Recommendations  AACE/ADA: New Consensus Statement on Inpatient Glycemic Control (2015)  Target Ranges:  Prepandial:   less than 140 mg/dL      Peak postprandial:   less than 180 mg/dL (1-2 hours)      Critically ill patients:  140 - 180 mg/dL   Results for [name], [name] (MRN [id number]) as of 06/20/2016 11:49  Ref. Range 06/19/2016 06:17 06/19/2016 11:30 06/19/2016 18:08 06/20/2016 00:00 06/20/2016 05:48  Glucose-Capillary Latest Ref Range: 65 - 99 mg/dL 129 (H) 206 (H) 165 (H) 224 (H) 244 (H)   Review of Glycemic Control  Outpatient Diabetes medications: Levemir 17 untis QHS, Novolog 5 units TID with meals Current orders for Inpatient glycemic control: Novolog 0-9 units Q6H  Inpatient Diabetes Program Recommendations: Insulin - Basal: Please consider ordering low dose basal insulin. Recommend starting with Levemir 6 units Q24H starting now (based on 61 kg x 0.1 units).  Thanks, Barnie Alderman, RN, MSN, CDE Diabetes Coordinator Inpatient Diabetes Program [phone] (Team Pager from [place] to [place]) [phone] ([place] office) [phone] [facility] office) [phone] [facility] office)

## 2016-06-20 NOTE — Discharge Summary (Signed)
[facility] [place] NAME: [name]    MR#:  [id number]  DATE OF BIRTH:  May 17, 1956  DATE OF ADMISSION:  06/17/2016 ADMITTING PHYSICIAN: Lance Coon, MD  DATE OF DISCHARGE: 06/20/16  PRIMARY CARE PHYSICIAN: No PCP Per Patient    ADMISSION DIAGNOSIS:  Gastroparesis [K31.84] Nausea vomiting and diarrhea [R11.2, R19.7]  DISCHARGE DIAGNOSIS:  Principal Problem:   Gastroparesis Active Problems:   DEPRESSION/ANXIETY   GERD   Essential hypertension   Uncontrolled type 2 diabetes mellitus (HCC)   Paroxysmal atrial fibrillation ([place])   SECONDARY DIAGNOSIS:   Past Medical History:  Diagnosis Date  . Allergy   . Anxiety   . Cancer (HCC)    HX OF CANCER OF UTERUS   . Cirrhosis of liver not due to alcohol ([place]) 2016  . Degenerative disk disease   . Diverticulitis   . Gastroparesis   . GERD (gastroesophageal reflux disease)   . History of hiatal hernia   . Hypertension   . Hypothyroid   . Intussusception intestine ([place]) 05/2015  . PAF (paroxysmal atrial fibrillation) ([place]) 03/2015   a. new onset 03/2015 in setting of intractable N/V; b. on Eliquis 5 mg bid; c. CHADSVASc 4 (DM, TIA x 2, female)  . Pancreatitis   . Sick sinus syndrome ([place])   . Stomach ulcer   . Stroke [facility])    with minimal left sided weakness  . Syncope 01/2015  . TIA (transient ischemic attack) 02/2015  . Type 1 diabetes [facility])    on levemir    HOSPITAL COURSE:  [name]  is a 60 y.o. female admitted 06/17/2016 with chief complaint Abdominal Pain . Please see H&P performed by Lance Coon, MD for further information. Patient presented yet again with intractable abdominal pain -- she ran out of pain medication. Her symptoms have been controlled. We discussed prior to discharge the importance of routine follow up. I have checked [place]CCSRS (controlled substance reporting system) she has only received medications from this hospital almost monthly.        DISCHARGE CONDITIONS:   stable  CONSULTS OBTAINED:    DRUG ALLERGIES:   Allergies  Allergen Reactions  . Hydrocodone Other (See Comments)    Pt states that this medication caused cirrhosis of the liver.    . Aspirin   . Erythromycin Other (See Comments)    Reaction:  Fever   . Prednisone Other (See Comments)    Reaction:  Unknown   . Rosiglitazone Maleate Swelling  . Codeine Sulfate Rash  . Tetanus-Diphtheria Toxoids Td Rash and Other (See Comments)    Reaction:  Fever     DISCHARGE MEDICATIONS:   Current Discharge Medication List    CONTINUE these medications which have CHANGED   Details  !! morphine (MS CONTIN) 30 MG 12 hr tablet Take 1 tablet (30 mg total) by mouth every 12 (twelve) hours. Qty: 60 tablet, Refills: 0    oxyCODONE (OXY IR/ROXICODONE) 5 MG immediate release tablet Take 2 tablets (10 mg total) by mouth every 4 (four) hours as needed for moderate pain. Qty: 45 tablet, Refills: 0     !! - Potential duplicate medications found. Please discuss with provider.    CONTINUE these medications which have NOT CHANGED   Details  acidophilus (RISAQUAD) CAPS capsule Take 2 capsules by mouth daily. Qty: 60 capsule, Refills: 0    albuterol (PROVENTIL HFA;VENTOLIN HFA) 108 (90 Base) MCG/ACT inhaler Inhale 2 puffs into the lungs every  6 (six) hours as needed for wheezing or shortness of breath. Qty: 1 Inhaler, Refills: 2    apixaban (ELIQUIS) 5 MG TABS tablet Take 1 tablet (5 mg total) by mouth 2 (two) times daily. Qty: 60 tablet, Refills: 0    atorvastatin (LIPITOR) 40 MG tablet Take 1 tablet (40 mg total) by mouth at bedtime. Qty: 30 tablet, Refills: 0    budesonide-formoterol (SYMBICORT) 160-4.5 MCG/ACT inhaler Inhale 2 puffs into the lungs 2 (two) times daily. Qty: 1 Inhaler, Refills: 1    dicyclomine (BENTYL) 10 MG capsule Take 1 capsule (10 mg total) by mouth 4 (four) times daily -  before meals and at bedtime. Qty: 120 capsule, Refills: 0     insulin aspart (NOVOLOG) 100 UNIT/ML injection Inject 5 Units into the skin 3 (three) times daily with meals as needed for high blood sugar. Pt uses as needed per sliding scale. Qty: 10 mL, Refills: 11    insulin detemir (LEVEMIR) 100 UNIT/ML injection Inject 0.05 mLs (5 Units total) into the skin at bedtime. Qty: 10 mL, Refills: 11    levothyroxine (SYNTHROID, LEVOTHROID) 75 MCG tablet Take 1 tablet (75 mcg total) by mouth daily before breakfast. Qty: 30 tablet, Refills: 1    metoCLOPramide (REGLAN) 10 MG tablet Take 1 tablet (10 mg total) by mouth 4 (four) times daily -  before meals and at bedtime. Qty: 60 tablet, Refills: 0    pantoprazole (PROTONIX) 40 MG tablet Take 1 tablet (40 mg total) by mouth 2 (two) times daily before a meal. Qty: 30 tablet, Refills: 0    rifaximin (XIFAXAN) 550 MG TABS tablet Take 1 tablet (550 mg total) by mouth 2 (two) times daily. Qty: 60 tablet, Refills: 1    spironolactone (ALDACTONE) 25 MG tablet Take 1 tablet (25 mg total) by mouth daily. Qty: 30 tablet, Refills: 0    bismuth subsalicylate (PEPTO BISMOL) 262 MG/15ML suspension Take 30 mLs by mouth every 4 (four) hours as needed for diarrhea or loose stools. Qty: 360 mL, Refills: 0    diphenhydrAMINE (BENADRYL) 25 mg capsule Take 2 capsules (50 mg total) by mouth every 6 (six) hours as needed. Qty: 60 capsule, Refills: 0    feeding supplement, GLUCERNA SHAKE, (GLUCERNA SHAKE) LIQD Take 237 mLs by mouth 3 (three) times daily between meals. Qty: 60 Can, Refills: 0    !! morphine (MS CONTIN) 30 MG 12 hr tablet Take 1 tablet (30 mg total) by mouth every 8 (eight) hours. Qty: 45 tablet, Refills: 0    ondansetron (ZOFRAN ODT) 4 MG disintegrating tablet Take 1 tablet (4 mg total) by mouth every 8 (eight) hours as needed for nausea or vomiting. Qty: 20 tablet, Refills: 0    tiZANidine (ZANAFLEX) 4 MG tablet Take 1 tablet (4 mg total) by mouth 3 (three) times daily as needed for muscle spasms. Qty: 12  tablet, Refills: 0     !! - Potential duplicate medications found. Please discuss with provider.       DISCHARGE INSTRUCTIONS:    DIET:  Regular diet - multiple small meals  DISCHARGE CONDITION:  Stable  ACTIVITY:  Activity as tolerated  OXYGEN:  Home Oxygen: No.   Oxygen Delivery: room air  DISCHARGE LOCATION:  home   If you experience worsening of your admission symptoms, develop shortness of breath, life threatening emergency, suicidal or homicidal thoughts you must seek medical attention immediately by calling 911 or calling your MD immediately  if symptoms less severe.  You Must read  complete instructions/literature along with all the possible adverse reactions/side effects for all the Medicines you take and that have been prescribed to you. Take any new Medicines after you have completely understood and accpet all the possible adverse reactions/side effects.   Please note  You were cared for by a hospitalist during your hospital stay. If you have any questions about your discharge medications or the care you received while you were in the hospital after you are discharged, you can call the unit and asked to speak with the hospitalist on call if the hospitalist that took care of you is not available. Once you are discharged, your primary care physician will handle any further medical issues. Please note that NO REFILLS for any discharge medications will be authorized once you are discharged, as it is imperative that you return to your primary care physician (or establish a relationship with a primary care physician if you do not have one) for your aftercare needs so that they can reassess your need for medications and monitor your lab values.    On the day of Discharge:   VITAL SIGNS:  Blood pressure 133/74, pulse (!) 106, temperature 98.5 F (36.9 C), temperature source Oral, resp. rate 20, height 5' 3"  (1.6 m), weight 61.2 kg (135 lb), SpO2 100 %.  I/O:    Intake/Output Summary (Last 24 hours) at 06/20/16 0951 Last data filed at 06/20/16 0900  Gross per 24 hour  Intake              596 ml  Output             1100 ml  Net             -504 ml    PHYSICAL EXAMINATION:  GENERAL:  60 y.o.-year-old patient lying in the bed with no acute distress.  EYES: Pupils equal, round, reactive to light and accommodation. No scleral icterus. Extraocular muscles intact.  HEENT: Head atraumatic, normocephalic. Oropharynx and nasopharynx clear.  NECK:  Supple, no jugular venous distention. No thyroid enlargement, no tenderness.  LUNGS: Normal breath sounds bilaterally, no wheezing, rales,rhonchi or crepitation. No use of accessory muscles of respiration.  CARDIOVASCULAR: S1, S2 normal. No murmurs, rubs, or gallops.  ABDOMEN: Soft, non-tender, non-distended. Bowel sounds present. No organomegaly or mass.  EXTREMITIES: No pedal edema, cyanosis, or clubbing.  NEUROLOGIC: Cranial nerves II through XII are intact. Muscle strength 5/5 in all extremities. Sensation intact. Gait not checked.  PSYCHIATRIC: The patient is alert and oriented x 3.  SKIN: No obvious rash, lesion, or ulcer.   DATA REVIEW:   CBC  Recent Labs Lab 06/18/16 0606  WBC 3.7  HGB 8.0*  HCT 24.5*  PLT 128*    Chemistries   Recent Labs Lab 06/17/16 1757 06/18/16 0606  NA 134* 138  K 3.3* 3.0*  CL 106 113*  CO2 23 21*  GLUCOSE 226* 117*  BUN 14 14  CREATININE 0.52 0.50  CALCIUM 8.6* 7.4*  MG  --  1.4*  AST 22  --   ALT 17  --   ALKPHOS 110  --   BILITOT 0.9  --     Cardiac Enzymes No results for input(s): TROPONINI in the last 168 hours.  Microbiology Results  Results for orders placed or performed during the hospital encounter of 03/13/16  C difficile quick scan w PCR reflex     Status: None   Collection Time: 03/15/16  6:33 PM  Result Value Ref Range Status  C Diff antigen NEGATIVE NEGATIVE Final   C Diff toxin NEGATIVE NEGATIVE Final   C Diff interpretation  No C. difficile detected.  Final    RADIOLOGY:  No results found.   Management plans discussed with the patient, family and they are in agreement.  CODE STATUS:     Code Status Orders        Start     Ordered   06/17/16 2246  Full code  Continuous     06/17/16 2245    Code Status History    Date Active Date Inactive Code Status Order ID Comments User Context   06/02/2016  5:46 AM 06/06/2016  6:21 PM DNR [id number]  Harrie Foreman, MD Inpatient   05/12/2016 11:43 AM 05/16/2016  4:38 PM DNR [id number]  Dustin Flock, MD Inpatient   05/12/2016  1:13 AM 05/12/2016 11:43 AM Full Code [id number]  Harvie Bridge, DO Inpatient   04/07/2016  6:18 AM 04/09/2016  2:19 PM DNR [id number]  Harrie Foreman, MD Inpatient   03/13/2016  9:25 PM 03/16/2016  6:32 PM DNR [id number]  Lance Coon, MD Inpatient   02/04/2016  9:39 AM 02/07/2016  2:59 PM DNR [id number]  Saundra Shelling, MD Inpatient   01/21/2016 10:24 PM 01/25/2016  5:02 PM DNR [id number]  Lance Coon, MD Inpatient   01/15/2016  1:39 PM 01/19/2016  3:29 PM DNR [id number]  Gladstone Lighter, MD ED   01/10/2016 10:00 PM 01/13/2016  5:24 PM DNR [id number]  Harrie Foreman, MD Inpatient   12/27/2015 11:02 PM 01/01/2016  6:17 PM DNR [id number]  Max Sane, MD Inpatient   12/16/2015  4:54 PM 12/19/2015  4:52 PM DNR [id number]  Loletha Grayer, MD ED   11/11/2015 11:36 AM 11/16/2015  2:05 PM Full Code [id number]  Samella Parr, NP Inpatient   10/15/2015  1:23 AM 10/16/2015  6:17 PM Full Code [id number]  Lance Coon, MD Inpatient   10/04/2015  8:19 PM 10/08/2015  6:34 PM Full Code [id number]  Bethena Roys, MD Inpatient   08/15/2015  1:05 AM 08/16/2015  8:04 PM Full Code [id number]  Harrie Foreman, MD Inpatient   08/01/2015  1:25 AM 08/06/2015  5:41 PM Full Code [id number]  Harrie Foreman, MD Inpatient   07/04/2015  2:53 AM 07/11/2015  6:33 PM Full Code [id number]  Lytle Butte, MD ED   05/27/2015 10:48 PM 05/31/2015  2:37 PM Full Code [id number]  Rise Patience, MD  Inpatient   05/19/2015  1:14 AM 05/22/2015  4:28 PM Full Code [id number]  Rise Patience, MD Inpatient   04/05/2015  9:16 AM 04/07/2015  5:23 PM Full Code [id number]  Bettey Costa, MD Inpatient   04/03/2015 12:31 PM 04/05/2015  9:16 AM Full Code [id number]  Bettey Costa, MD Inpatient   03/11/2015  4:49 PM 03/14/2015  3:50 PM Full Code [id number]  Dustin Flock, MD Inpatient   02/15/2015  4:51 PM 02/17/2015  2:33 PM DNR [id number]  Dustin Flock, MD Inpatient   02/15/2015  4:35 PM 02/15/2015  4:51 PM Full Code [id number]  Dustin Flock, MD Inpatient   01/11/2015  2:40 AM 01/12/2015  6:11 PM Full Code [id number]  Etta Quill, DO ED    Advance Directive Documentation   Flowsheet Row Most Recent Value  Type of Advance Directive  Healthcare Power of Attorney, Living will  Pre-existing out of facility DNR order (yellow form or pink MOST form)  No data  "MOST" Form in Place?  No data  TOTAL TIME TAKING CARE OF THIS PATIENT: 33 minutes.    Hower,  Karenann Cai.D on 06/20/2016 at 9:51 AM  Between 7am to 6pm - Pager - [phone]  After 6pm go to [email or web address] - [employer] [facility] [place] [facility]  Office  [phone]  CC: Primary care physician; No PCP Per Patient

## 2016-06-20 NOTE — Care Management (Signed)
Patient admitted for Gastroparesis.  Patient has had frequent admission. Patient lives at home with her husband. Notified by nursing that patient does not have PCP at discharge.  Upon chart review patient has been provided with resources multiple times, including list of PCP who are accepting new patients.  I spoke with the patent who states that she plans on following up at [facility] office.  Patient states that her copay will be $10 and will not be able to pay it still she receives her check November 3rd.  I insisted she let me call and make the new patient appointment.  Patient declined and states "dont take offense to this but I have a ride down stairs and I am leaving, I promise ill take care of it".  Dr. Lavetta Nielsen notified.

## 2016-06-23 ENCOUNTER — Other Ambulatory Visit: Payer: Self-pay

## 2016-07-09 ENCOUNTER — Inpatient Hospital Stay
Admission: EM | Admit: 2016-07-09 | Discharge: 2016-07-10 | DRG: 074 | Disposition: A | Discharge status: Home / Self Care | Payer: Commercial Managed Care - HMO | Attending: Internal Medicine | Admitting: Internal Medicine

## 2016-07-09 ENCOUNTER — Emergency Department: Payer: Commercial Managed Care - HMO

## 2016-07-09 ENCOUNTER — Encounter: Payer: Self-pay | Admitting: *Deleted

## 2016-07-09 DIAGNOSIS — I10 Essential (primary) hypertension: Secondary | ICD-10-CM | POA: Diagnosis present

## 2016-07-09 DIAGNOSIS — I4581 Long QT syndrome: Secondary | ICD-10-CM | POA: Diagnosis present

## 2016-07-09 DIAGNOSIS — E119 Type 2 diabetes mellitus without complications: Secondary | ICD-10-CM | POA: Diagnosis not present

## 2016-07-09 DIAGNOSIS — G8929 Other chronic pain: Secondary | ICD-10-CM | POA: Diagnosis present

## 2016-07-09 DIAGNOSIS — K3184 Gastroparesis: Secondary | ICD-10-CM | POA: Diagnosis present

## 2016-07-09 DIAGNOSIS — Z8542 Personal history of malignant neoplasm of other parts of uterus: Secondary | ICD-10-CM | POA: Diagnosis not present

## 2016-07-09 DIAGNOSIS — E1165 Type 2 diabetes mellitus with hyperglycemia: Secondary | ICD-10-CM | POA: Diagnosis present

## 2016-07-09 DIAGNOSIS — I4891 Unspecified atrial fibrillation: Secondary | ICD-10-CM | POA: Diagnosis present

## 2016-07-09 DIAGNOSIS — Z8249 Family history of ischemic heart disease and other diseases of the circulatory system: Secondary | ICD-10-CM

## 2016-07-09 DIAGNOSIS — N390 Urinary tract infection, site not specified: Secondary | ICD-10-CM | POA: Diagnosis not present

## 2016-07-09 DIAGNOSIS — Z9119 Patient's noncompliance with other medical treatment and regimen: Secondary | ICD-10-CM

## 2016-07-09 DIAGNOSIS — Z794 Long term (current) use of insulin: Secondary | ICD-10-CM

## 2016-07-09 DIAGNOSIS — E1143 Type 2 diabetes mellitus with diabetic autonomic (poly)neuropathy: Principal | ICD-10-CM | POA: Diagnosis present

## 2016-07-09 DIAGNOSIS — R1013 Epigastric pain: Secondary | ICD-10-CM

## 2016-07-09 DIAGNOSIS — R112 Nausea with vomiting, unspecified: Secondary | ICD-10-CM | POA: Diagnosis not present

## 2016-07-09 DIAGNOSIS — Z8673 Personal history of transient ischemic attack (TIA), and cerebral infarction without residual deficits: Secondary | ICD-10-CM

## 2016-07-09 DIAGNOSIS — E032 Hypothyroidism due to medicaments and other exogenous substances: Secondary | ICD-10-CM | POA: Diagnosis present

## 2016-07-09 DIAGNOSIS — T462X5A Adverse effect of other antidysrhythmic drugs, initial encounter: Secondary | ICD-10-CM | POA: Diagnosis present

## 2016-07-09 DIAGNOSIS — T462X1A Poisoning by other antidysrhythmic drugs, accidental (unintentional), initial encounter: Secondary | ICD-10-CM

## 2016-07-09 DIAGNOSIS — F341 Dysthymic disorder: Secondary | ICD-10-CM | POA: Diagnosis present

## 2016-07-09 DIAGNOSIS — F1120 Opioid dependence, uncomplicated: Secondary | ICD-10-CM | POA: Diagnosis present

## 2016-07-09 DIAGNOSIS — I480 Paroxysmal atrial fibrillation: Secondary | ICD-10-CM | POA: Diagnosis present

## 2016-07-09 DIAGNOSIS — R9431 Abnormal electrocardiogram [ECG] [EKG]: Secondary | ICD-10-CM | POA: Diagnosis not present

## 2016-07-09 DIAGNOSIS — K219 Gastro-esophageal reflux disease without esophagitis: Secondary | ICD-10-CM | POA: Diagnosis not present

## 2016-07-09 DIAGNOSIS — R0600 Dyspnea, unspecified: Secondary | ICD-10-CM | POA: Diagnosis not present

## 2016-07-09 DIAGNOSIS — R079 Chest pain, unspecified: Secondary | ICD-10-CM | POA: Diagnosis not present

## 2016-07-09 DIAGNOSIS — Z01810 Encounter for preprocedural cardiovascular examination: Secondary | ICD-10-CM | POA: Diagnosis not present

## 2016-07-09 DIAGNOSIS — E785 Hyperlipidemia, unspecified: Secondary | ICD-10-CM | POA: Diagnosis present

## 2016-07-09 LAB — GLUCOSE, CAPILLARY
Glucose-Capillary: 329 mg/dL — ABNORMAL HIGH (ref 65–99)
Glucose-Capillary: 334 mg/dL — ABNORMAL HIGH (ref 65–99)
Glucose-Capillary: 279 mg/dL — ABNORMAL HIGH (ref 65–99)

## 2016-07-09 LAB — BASIC METABOLIC PANEL
ANION GAP: 9 (ref 5–15)
Anion gap: 11 (ref 5–15)
BUN: 24 mg/dL — AB (ref 6–20)
BUN: 24 mg/dL — AB (ref 6–20)
CHLORIDE: 106 mmol/L (ref 101–111)
CO2: 20 mmol/L — ABNORMAL LOW (ref 22–32)
CO2: 19 mmol/L — AB (ref 22–32)
CREATININE: 0.88 mg/dL (ref 0.44–1.00)
Calcium: 9.2 mg/dL (ref 8.9–10.3)
Calcium: 8.6 mg/dL — ABNORMAL LOW (ref 8.9–10.3)
Chloride: 109 mmol/L (ref 101–111)
Creatinine, Ser: 0.8 mg/dL (ref 0.44–1.00)
GFR calc Af Amer: >60 mL/min (ref >60)
GFR calc non Af Amer: >60 mL/min (ref >60)
GLUCOSE: 346 mg/dL — AB (ref 65–99)
Glucose, Bld: 294 mg/dL — ABNORMAL HIGH (ref 65–99)
POTASSIUM: 3.4 mmol/L — AB (ref 3.5–5.1)
POTASSIUM: 3.7 mmol/L (ref 3.5–5.1)
SODIUM: 137 mmol/L (ref 135–145)
Sodium: 137 mmol/L (ref 135–145)

## 2016-07-09 LAB — URINALYSIS COMPLETE WITH MICROSCOPIC (ARMC ONLY)
BILIRUBIN URINE: NEGATIVE
Bacteria, UA: NONE SEEN
KETONES UR: NEGATIVE mg/dL
Leukocytes, UA: NEGATIVE
NITRITE: NEGATIVE
Protein, ur: 100 mg/dL — AB
SPECIFIC GRAVITY, URINE: 1.018 (ref 1.005–1.030)
Squamous Epithelial / LPF: NONE SEEN
pH: 6 (ref 5.0–8.0)

## 2016-07-09 LAB — TROPONIN I: Troponin I: <0.03 ng/mL (ref <0.03)

## 2016-07-09 LAB — CBC
HCT: 33.9 % — ABNORMAL LOW (ref 35.0–47.0)
HEMATOCRIT: 32.3 % — AB (ref 35.0–47.0)
HEMOGLOBIN: 10.2 g/dL — AB (ref 12.0–16.0)
Hemoglobin: 10.8 g/dL — ABNORMAL LOW (ref 12.0–16.0)
MCH: 24.5 pg — ABNORMAL LOW (ref 26.0–34.0)
MCH: 24.6 pg — AB (ref 26.0–34.0)
MCHC: 31.9 g/dL — ABNORMAL LOW (ref 32.0–36.0)
MCHC: 31.6 g/dL — AB (ref 32.0–36.0)
MCV: 76.9 fL — AB (ref 80.0–100.0)
MCV: 77.6 fL — ABNORMAL LOW (ref 80.0–100.0)
PLATELETS: 141 10*3/uL — AB (ref 150–440)
Platelets: 118 10*3/uL — ABNORMAL LOW (ref 150–440)
RBC: 4.41 MIL/uL (ref 3.80–5.20)
RBC: 4.17 MIL/uL (ref 3.80–5.20)
RDW: 18.6 % — AB (ref 11.5–14.5)
RDW: 18.7 % — AB (ref 11.5–14.5)
WBC: 5.3 10*3/uL (ref 3.6–11.0)
WBC: 5.1 10*3/uL (ref 3.6–11.0)

## 2016-07-09 LAB — MAGNESIUM: MAGNESIUM: 1.6 mg/dL — AB (ref 1.7–2.4)

## 2016-07-09 MED ORDER — MORPHINE SULFATE (PF) 2 MG/ML IV SOLN
2.0000 mg | Freq: Once | INTRAVENOUS | Status: AC
Start: 2016-07-09 — End: 2016-07-09
  Administered 2016-07-09: 2 mg via INTRAVENOUS
  Filled 2016-07-09: qty 1

## 2016-07-09 MED ORDER — HYDRALAZINE HCL 20 MG/ML IJ SOLN
10.0000 mg | Freq: Four times a day (QID) | INTRAMUSCULAR | Status: DC | PRN
  Administered 2016-07-09: 10 mg via INTRAVENOUS
  Filled 2016-07-09: qty 1

## 2016-07-09 MED ORDER — ONDANSETRON HCL 4 MG/2ML IJ SOLN
INTRAMUSCULAR | Status: AC
  Administered 2016-07-09: 4 mg via INTRAVENOUS
  Filled 2016-07-09: qty 2

## 2016-07-09 MED ORDER — METOCLOPRAMIDE HCL 5 MG/ML IJ SOLN
5.0000 mg | Freq: Once | INTRAMUSCULAR | Status: AC
  Administered 2016-07-09: 5 mg via INTRAVENOUS
  Filled 2016-07-09: qty 2

## 2016-07-09 MED ORDER — INSULIN DETEMIR 100 UNIT/ML ~~LOC~~ SOLN
8.0000 [IU] | Freq: Every day | SUBCUTANEOUS | Status: DC
  Administered 2016-07-09: 8 [IU] via SUBCUTANEOUS
  Filled 2016-07-09 (×3): qty 0.08

## 2016-07-09 MED ORDER — INSULIN ASPART 100 UNIT/ML ~~LOC~~ SOLN
0.0000 [IU] | Freq: Four times a day (QID) | SUBCUTANEOUS | Status: DC
  Administered 2016-07-09: 7 [IU] via SUBCUTANEOUS
  Administered 2016-07-09: 5 [IU] via SUBCUTANEOUS
  Administered 2016-07-09: 7 [IU] via SUBCUTANEOUS
  Administered 2016-07-10 (×2): 2 [IU] via SUBCUTANEOUS
  Filled 2016-07-09: qty 7
  Filled 2016-07-09 (×2): qty 2
  Filled 2016-07-09: qty 7

## 2016-07-09 MED ORDER — METOCLOPRAMIDE HCL 5 MG/ML IJ SOLN
INTRAMUSCULAR | Status: AC
  Administered 2016-07-09: 5 mg via INTRAVENOUS
  Filled 2016-07-09: qty 2

## 2016-07-09 MED ORDER — SODIUM CHLORIDE 0.9 % IV SOLN
INTRAVENOUS | Status: AC
  Administered 2016-07-09: 18:00:00 via INTRAVENOUS

## 2016-07-09 MED ORDER — APIXABAN 5 MG PO TABS
5.0000 mg | ORAL_TABLET | Freq: Two times a day (BID) | ORAL | Status: DC
  Administered 2016-07-09 – 2016-07-10 (×3): 5 mg via ORAL
  Filled 2016-07-09 (×3): qty 1

## 2016-07-09 MED ORDER — HALOPERIDOL LACTATE 5 MG/ML IJ SOLN
5.0000 mg | Freq: Once | INTRAMUSCULAR | Status: AC
  Administered 2016-07-09: 5 mg via INTRAVENOUS
  Filled 2016-07-09: qty 1

## 2016-07-09 MED ORDER — PANTOPRAZOLE SODIUM 40 MG PO TBEC
40.0000 mg | DELAYED_RELEASE_TABLET | Freq: Two times a day (BID) | ORAL | Status: DC
  Administered 2016-07-09 – 2016-07-10 (×3): 40 mg via ORAL
  Filled 2016-07-09 (×3): qty 1

## 2016-07-09 MED ORDER — METOPROLOL TARTRATE 5 MG/5ML IV SOLN
5.0000 mg | Freq: Once | INTRAVENOUS | Status: AC
  Administered 2016-07-09: 5 mg via INTRAVENOUS
  Filled 2016-07-09: qty 5

## 2016-07-09 MED ORDER — MOMETASONE FURO-FORMOTEROL FUM 200-5 MCG/ACT IN AERO
2.0000 | INHALATION_SPRAY | Freq: Two times a day (BID) | RESPIRATORY_TRACT | Status: DC
  Administered 2016-07-09 – 2016-07-10 (×3): 2 via RESPIRATORY_TRACT
  Filled 2016-07-09: qty 8.8

## 2016-07-09 MED ORDER — LORAZEPAM 2 MG/ML IJ SOLN
1.0000 mg | Freq: Once | INTRAMUSCULAR | Status: AC
  Administered 2016-07-09: 1 mg via INTRAVENOUS
  Filled 2016-07-09: qty 1

## 2016-07-09 MED ORDER — SODIUM CHLORIDE 0.9 % IV SOLN
INTRAVENOUS | Status: AC
  Administered 2016-07-09: 05:00:00 via INTRAVENOUS

## 2016-07-09 MED ORDER — MORPHINE SULFATE ER 30 MG PO TBCR
30.0000 mg | EXTENDED_RELEASE_TABLET | Freq: Two times a day (BID) | ORAL | Status: DC
  Administered 2016-07-09 – 2016-07-10 (×3): 30 mg via ORAL
  Filled 2016-07-09 (×3): qty 1

## 2016-07-09 MED ORDER — ONDANSETRON HCL 4 MG/2ML IJ SOLN
4.0000 mg | Freq: Once | INTRAMUSCULAR | Status: AC
  Administered 2016-07-09: 4 mg via INTRAVENOUS

## 2016-07-09 MED ORDER — OXYCODONE HCL 5 MG PO TABS
10.0000 mg | ORAL_TABLET | Freq: Four times a day (QID) | ORAL | Status: DC | PRN
  Administered 2016-07-09 – 2016-07-10 (×4): 10 mg via ORAL
  Filled 2016-07-09 (×4): qty 2

## 2016-07-09 MED ORDER — ACETAMINOPHEN 325 MG PO TABS: 650.0000 mg | ORAL_TABLET | Freq: Four times a day (QID) | ORAL | Status: DC | PRN

## 2016-07-09 MED ORDER — LEVOTHYROXINE SODIUM 75 MCG PO TABS
75.0000 ug | ORAL_TABLET | Freq: Every day | ORAL | Status: DC
  Administered 2016-07-09 – 2016-07-10 (×2): 75 ug via ORAL
  Filled 2016-07-09 (×2): qty 1

## 2016-07-09 MED ORDER — SPIRONOLACTONE 25 MG PO TABS
25.0000 mg | ORAL_TABLET | Freq: Every day | ORAL | Status: DC
  Administered 2016-07-09 – 2016-07-10 (×2): 25 mg via ORAL
  Filled 2016-07-09 (×2): qty 1

## 2016-07-09 MED ORDER — ATORVASTATIN CALCIUM 20 MG PO TABS
40.0000 mg | ORAL_TABLET | Freq: Every day | ORAL | Status: DC
  Administered 2016-07-09: 40 mg via ORAL
  Filled 2016-07-09: qty 2

## 2016-07-09 MED ORDER — KETOROLAC TROMETHAMINE 15 MG/ML IJ SOLN
15.0000 mg | Freq: Once | INTRAMUSCULAR | Status: AC
  Administered 2016-07-09: 15 mg via INTRAVENOUS
  Filled 2016-07-09: qty 1

## 2016-07-09 MED ORDER — PROCHLORPERAZINE EDISYLATE 5 MG/ML IJ SOLN
10.0000 mg | Freq: Four times a day (QID) | INTRAMUSCULAR | Status: DC | PRN
  Administered 2016-07-09: 10 mg via INTRAVENOUS
  Filled 2016-07-09: qty 2

## 2016-07-09 MED ORDER — SODIUM CHLORIDE 0.9 % IV BOLUS (SEPSIS)
1000.0000 mL | Freq: Once | INTRAVENOUS | Status: AC
  Administered 2016-07-09: 1000 mL via INTRAVENOUS

## 2016-07-09 MED ORDER — METOCLOPRAMIDE HCL 10 MG PO TABS
10.0000 mg | ORAL_TABLET | Freq: Three times a day (TID) | ORAL | Status: DC
  Administered 2016-07-09 – 2016-07-10 (×5): 10 mg via ORAL
  Filled 2016-07-09 (×5): qty 1

## 2016-07-09 MED ORDER — METOPROLOL TARTRATE 25 MG PO TABS
25.0000 mg | ORAL_TABLET | Freq: Two times a day (BID) | ORAL | Status: DC
  Administered 2016-07-09 – 2016-07-10 (×3): 25 mg via ORAL
  Filled 2016-07-09 (×3): qty 1

## 2016-07-09 MED ORDER — ACETAMINOPHEN 650 MG RE SUPP
650.0000 mg | Freq: Four times a day (QID) | RECTAL | Status: DC | PRN
Start: 2016-07-09 — End: 2016-07-10

## 2016-07-09 NOTE — Progress Notes (Signed)
Initial Nutrition Assessment  DOCUMENTATION CODES:   Non-severe (moderate) malnutrition in context of chronic illness  INTERVENTION:  -Reviewed diet recommendations for diabetic pt with gastroparesis with pt; pt has been educated on past admissions and will continue to review this admission -Discussed pt's case with Inpatient Diabetes Coordinator; they plan to follow-up with pt to discuss medication regimen, checking blood sugars, etc -Once tolerating diet, recommend smaller, more frequent meals. Encourage lower fat, lower fiber meals. Pt may also benefit from addition of nutritional supplement  NUTRITION DIAGNOSIS:   Malnutrition related to chronic illness as evidenced by mild depletion of body fat, mild depletion of muscle mass.  GOAL:   Patient will meet greater than or equal to 90% of their needs  MONITOR:   PO intake, Diet advancement, Labs, Weight trends  REASON FOR ASSESSMENT:   Malnutrition Screening Tool    ASSESSMENT:    60 yo female admitted with N/V/D with known history of gastroparesis with frequent flare-ups requiring hospitalization  Past Medical History:  Diagnosis Date  . Allergy   . Anxiety   . Cancer (HCC)    HX OF CANCER OF UTERUS   . Cirrhosis of liver not due to alcohol ([place]) 2016  . Degenerative disk disease   . Diverticulitis   . Gastroparesis   . GERD (gastroesophageal reflux disease)   . History of hiatal hernia   . Hypertension   . Hypothyroid   . Intussusception intestine ([place]) 05/2015  . PAF (paroxysmal atrial fibrillation) ([place]) 03/2015   a. new onset 03/2015 in setting of intractable N/V; b. on Eliquis 5 mg bid; c. CHADSVASc 4 (DM, TIA x 2, female)  . Pancreatitis   . Sick sinus syndrome ([place])   . Stomach ulcer   . Stroke [facility])    with minimal left sided weakness  . Syncope 01/2015  . TIA (transient ischemic attack) 02/2015  . Type 1 diabetes ([place])    on levemir   Pt not tolerating CL diet; pt reports N/V/abdominal pain today.  Pt  reports minimal po intake for the past 4-5 days prior to admission due to N/V. Pt reports she tries to eat 4-5 small meals per day when feeling well. Pt reports wt fluctuations and believes she has lost 5 pounds in 2 days. Pt reports she checks her blood sugars 2x daily and usually are around 200-300.   Nutrition-Focused physical exam completed. Findings are mild fat depletion, mild muscle depletion, and mild edema.   Diet Order:  Diet clear liquid Room service appropriate? Yes; Fluid consistency: Thin  Skin:  Reviewed, no issues  Last BM:  10/30   Labs:  Glucose Profile:   Recent Labs  07/09/16 0622 07/09/16 1210  GLUCAP 329* 334*   Meds: NS at 50 ml/hr, reglan, ss novolog  Height:   Ht Readings from Last 1 Encounters:  07/09/16 5' 3"  (1.6 m)    Weight:   Wt Readings from Last 1 Encounters:  07/09/16 135 lb (61.2 kg)    Filed Weights   07/09/16 0128  Weight: 135 lb (61.2 kg)   Wt Readings from Last 10 Encounters:  07/09/16 135 lb (61.2 kg)  06/17/16 135 lb (61.2 kg)  06/06/16 141 lb 14.4 oz (64.4 kg)  05/12/16 128 lb 12.8 oz (58.4 kg)  04/27/16 132 lb (59.9 kg)  04/09/16 139 lb 14.4 oz (63.5 kg)  03/13/16 136 lb (61.7 kg)  02/09/16 130 lb (59 kg)  02/04/16 139 lb 6 oz (63.2 kg)  01/21/16 151 lb  12.8 oz (68.9 kg)    BMI:  Body mass index is 23.91 kg/m.  Estimated Nutritional Needs:   Kcal:  1700-1900 kcals  Protein:  73-92 g  Fluid:  >/= 1.7 L  EDUCATION NEEDS:   Education needs addressed  Kerman Passey MS, [place], LDN [phone] Pager  [phone] Weekend/On-Call Pager

## 2016-07-09 NOTE — ED Provider Notes (Signed)
[facility] Emergency Department Provider Note   ____________________________________________   First MD Initiated Contact with Patient 07/09/16 [phone]     (approximate)  I have reviewed the triage vital signs and the nursing notes.   HISTORY  Chief Complaint Abdominal Pain and Chest Pain    HPI [name] is a 60 y.o. female who presents to the ED from home via EMS with a chief complaint of abdominal pain, nausea, vomiting, diarrhea and hematuria. Patient has a history of insulin-dependent diabetes with multiple admissions for gastroparesis. Reports a three-day history of abdominal pain, nausea and vomiting more than diarrhea.Reports mainly epigastric discomfort which radiates into her chest. Denies associated fever, chills, shortness of breath. Denies recent travel or trauma. Nothing makes her symptoms better or worse.   Past Medical History:  Diagnosis Date  . Allergy   . Anxiety   . Cancer (HCC)    HX OF CANCER OF UTERUS   . Cirrhosis of liver not due to alcohol ([place]) 2016  . Degenerative disk disease   . Diverticulitis   . Gastroparesis   . GERD (gastroesophageal reflux disease)   . History of hiatal hernia   . Hypertension   . Hypothyroid   . Intussusception intestine ([place]) 05/2015  . PAF (paroxysmal atrial fibrillation) ([place]) 03/2015   a. new onset 03/2015 in setting of intractable N/V; b. on Eliquis 5 mg bid; c. CHADSVASc 4 (DM, TIA x 2, female)  . Pancreatitis   . Sick sinus syndrome ([place])   . Stomach ulcer   . Stroke [facility])    with minimal left sided weakness  . Syncope 01/2015  . TIA (transient ischemic attack) 02/2015  . Type 1 diabetes ([place])    on levemir    Patient Active Problem List   Diagnosis Date Noted  . Epigastric pain   . Abdominal pain, epigastric   . Personal history of surgery to heart and great vessels, presenting hazards to health   . Gastritis   . Foreign body in stomach   . Abnormal  findings-gastrointestinal tract   . Diarrhea   . Congestive dilated cardiomyopathy ([place])   . Generalized abdominal pain   . Nausea & vomiting   . NSTEMI (non-ST elevated myocardial infarction) ([place]) 01/11/2016  . Tachyarrhythmia 01/10/2016  . Intractable nausea and vomiting 01/10/2016  . Tachy-brady syndrome ([place]) 12/28/2015  . Abdominal pain 12/27/2015  . Colitis 12/16/2015  . Pneumonia 11/14/2015  . Narcotic withdrawal ([place]) 11/11/2015  . Diabetes mellitus type 2, insulin dependent ([place]) 11/11/2015  . Orthostatic hypotension   . H/O TIA (transient ischemic attack) and stroke   . Left-sided weakness 10/04/2015  . Expressive aphasia 10/04/2015  . Ileus ([place]) 08/01/2015  . Ascites   . Cryptogenic cirrhosis ([place]) 07/10/2015  . Paroxysmal atrial fibrillation ([place]) 07/10/2015  . C. difficile colitis 07/10/2015  . GI (gastrointestinal bleed) 07/06/2015  . Malnutrition of moderate degree 07/04/2015  . Symptomatic bradycardia 05/27/2015  . Intussusception intestine ([place])   . Hypothyroidism due to amiodarone   . Abdominal pain, chronic, epigastric   . Chronic anemia 05/19/2015  . Thrombocytopenia ([place]) 05/19/2015  . Prolonged QT interval   . Hypomagnesemia   . Narcotic abuse   . Uncontrolled type 2 diabetes mellitus ([place])   . Syncope due to orthostatic hypotension 05/18/2015  . Hypokalemia 04/06/2015  . Hyperlipidemia with target LDL less than 100 04/06/2015  . CVA (cerebral infarction) 02/15/2015  . Essential hypertension 01/12/2015  . Chronically on opiate therapy 01/12/2015  .  Gastroparesis 01/12/2015  . DEPRESSION/ANXIETY 06/27/2007  . MYOFASCIAL PAIN SYNDROME 06/27/2007  . Chronic pain syndrome 03/28/2007  . GERD 03/27/2007  . DIVERTICULOSIS, COLON 03/27/2007  . LUMBAR DISC DISPLACEMENT 03/27/2007  . PROTEINURIA 03/27/2007  . UTERINE CANCER, HX OF 03/27/2007    Past Surgical History:  Procedure Laterality Date  . ABDOMINAL HYSTERECTOMY    . CARDIAC CATHETERIZATION  N/A 01/12/2016   Procedure: Left Heart Cath and Coronary Angiography;  Surgeon: Wellington Hampshire, MD;  Location: [facility];  Service: Cardiovascular;  Laterality: N/A;  . CHOLECYSTECTOMY    . ESOPHAGOGASTRODUODENOSCOPY N/A 04/04/2015   Procedure: ESOPHAGOGASTRODUODENOSCOPY (EGD);  Surgeon: Hulen Luster, MD;  Location: [facility] ENDOSCOPY;  Service: Endoscopy;  Laterality: N/A;  . ESOPHAGOGASTRODUODENOSCOPY (EGD) WITH PROPOFOL N/A 01/18/2016   Procedure: ESOPHAGOGASTRODUODENOSCOPY (EGD) WITH PROPOFOL;  Surgeon: Lucilla Lame, MD;  Location: [facility] ENDOSCOPY;  Service: Endoscopy;  Laterality: N/A;  . FLEXIBLE SIGMOIDOSCOPY N/A 01/18/2016   Procedure: FLEXIBLE SIGMOIDOSCOPY;  Surgeon: Lucilla Lame, MD;  Location: [facility] ENDOSCOPY;  Service: Endoscopy;  Laterality: N/A;  . HERNIA REPAIR      Prior to Admission medications   Medication Sig Start Date End Date Taking? Authorizing Provider  acidophilus (RISAQUAD) CAPS capsule Take 2 capsules by mouth daily. 01/25/16  Yes Sital Mody, MD  albuterol (PROVENTIL HFA;VENTOLIN HFA) 108 (90 Base) MCG/ACT inhaler Inhale 2 puffs into the lungs every 6 (six) hours as needed for wheezing or shortness of breath. 05/15/16  Yes Epifanio Lesches, MD  apixaban (ELIQUIS) 5 MG TABS tablet Take 1 tablet (5 mg total) by mouth 2 (two) times daily. 05/15/16  Yes Epifanio Lesches, MD  atorvastatin (LIPITOR) 40 MG tablet Take 1 tablet (40 mg total) by mouth at bedtime. 05/15/16  Yes Epifanio Lesches, MD  budesonide-formoterol (SYMBICORT) 160-4.5 MCG/ACT inhaler Inhale 2 puffs into the lungs 2 (two) times daily. 05/15/16  Yes Epifanio Lesches, MD  diphenhydrAMINE (BENADRYL) 25 mg capsule Take 2 capsules (50 mg total) by mouth every 6 (six) hours as needed. Patient taking differently: Take 50 mg by mouth every 6 (six) hours as needed for itching or allergies.  02/09/16  Yes Carrie Mew, MD  feeding supplement, GLUCERNA SHAKE, (GLUCERNA SHAKE) LIQD Take 237 mLs by mouth 3 (three)  times daily between meals. 05/15/16  Yes Epifanio Lesches, MD  insulin aspart (NOVOLOG) 100 UNIT/ML injection Inject 5 Units into the skin 3 (three) times daily with meals as needed for high blood sugar. Pt uses as needed per sliding scale. 05/15/16  Yes Epifanio Lesches, MD  insulin detemir (LEVEMIR) 100 UNIT/ML injection Inject 0.05 mLs (5 Units total) into the skin at bedtime. Patient taking differently: Inject 17 Units into the skin at bedtime.  05/15/16  Yes Epifanio Lesches, MD  levothyroxine (SYNTHROID, LEVOTHROID) 75 MCG tablet Take 1 tablet (75 mcg total) by mouth daily before breakfast. 05/15/16  Yes Epifanio Lesches, MD  metoCLOPramide (REGLAN) 10 MG tablet Take 1 tablet (10 mg total) by mouth 4 (four) times daily -  before meals and at bedtime. 05/15/16  Yes Epifanio Lesches, MD  morphine (MS CONTIN) 30 MG 12 hr tablet Take 1 tablet (30 mg total) by mouth every 12 (twelve) hours. 06/20/16 07/20/16 Yes Lytle Butte, MD  ondansetron (ZOFRAN ODT) 4 MG disintegrating tablet Take 1 tablet (4 mg total) by mouth every 8 (eight) hours as needed for nausea or vomiting. 05/15/16  Yes Epifanio Lesches, MD  oxyCODONE (OXY IR/ROXICODONE) 5 MG immediate release tablet Take 2 tablets (10 mg total)  by mouth every 4 (four) hours as needed for moderate pain. 06/20/16 07/20/16 Yes Lytle Butte, MD  pantoprazole (PROTONIX) 40 MG tablet Take 1 tablet (40 mg total) by mouth 2 (two) times daily before a meal. 05/15/16  Yes Epifanio Lesches, MD  rifaximin (XIFAXAN) 550 MG TABS tablet Take 1 tablet (550 mg total) by mouth 2 (two) times daily. 05/15/16  Yes Epifanio Lesches, MD  spironolactone (ALDACTONE) 25 MG tablet Take 1 tablet (25 mg total) by mouth daily. 05/15/16  Yes Epifanio Lesches, MD  tiZANidine (ZANAFLEX) 4 MG tablet Take 1 tablet (4 mg total) by mouth 3 (three) times daily as needed for muscle spasms. 05/15/16  Yes Epifanio Lesches, MD  bismuth subsalicylate (PEPTO BISMOL) 262 MG/15ML  suspension Take 30 mLs by mouth every 4 (four) hours as needed for diarrhea or loose stools. 01/25/16   Bettey Costa, MD    Allergies Hydrocodone; Aspirin; Erythromycin; Prednisone; Rosiglitazone maleate; Codeine sulfate; and Tetanus-diphtheria toxoids td  Family History  Problem Relation Age of Onset  . Hypertension Mother   . CAD Sister   . Heart attack Sister     Deceased 11/20/14  . CAD Brother     Social History Social History  Substance Use Topics  . Smoking status: Never Smoker  . Smokeless tobacco: Never Used  . Alcohol use No    Review of Systems  Constitutional: No fever/chills. Eyes: No visual changes. ENT: No sore throat. Cardiovascular: Denies chest pain. Respiratory: Denies shortness of breath. Gastrointestinal: Positive for abdominal pain, nausea, vomiting and diarrhea.  No constipation. Genitourinary: Negative for dysuria. Musculoskeletal: Negative for back pain. Skin: Negative for rash. Neurological: Negative for headaches, focal weakness or numbness.  10-point ROS otherwise negative.  ____________________________________________   PHYSICAL EXAM:  VITAL SIGNS: ED Triage Vitals  Enc Vitals Group     BP 07/09/16 0127 (!) 210/102     Pulse Rate 07/09/16 0127 (!) 105     Resp 07/09/16 0127 20     Temp 07/09/16 0127 98.3 F (36.8 C)     Temp Source 07/09/16 0127 Oral     SpO2 07/09/16 0120 90 %     Weight 07/09/16 0128 135 lb (61.2 kg)     Height 07/09/16 0128 5' 3"  (1.6 m)     Head Circumference --      Peak Flow --      Pain Score 07/09/16 0128 9     Pain Loc --      Pain Edu? --      Excl. in [place]? --     Constitutional: Alert and oriented. Well appearing and in moderate acute distress. Eyes: Conjunctivae are normal. PERRL. EOMI. Head: Atraumatic. Nose: No congestion/rhinnorhea. Mouth/Throat: Mucous membranes are mildly dry.  Oropharynx non-erythematous. Neck: No stridor.   Cardiovascular: Normal rate, regular rhythm. Grossly normal heart  sounds.  Good peripheral circulation. Respiratory: Normal respiratory effort.  No retractions. Lungs CTAB. Gastrointestinal: Soft and mildly tender to palpation epigastrium without rebound or guarding. No distention. No abdominal bruits. No CVA tenderness. Actively vomiting. Musculoskeletal: No lower extremity tenderness nor edema.  No joint effusions. Neurologic:  Normal speech and language. No gross focal neurologic deficits are appreciated.  Skin:  Skin is warm, dry and intact. No rash noted. Psychiatric: Mood and affect are normal. Speech and behavior are normal.  ____________________________________________   LABS (all labs ordered are listed, but only abnormal results are displayed)  Labs Reviewed  BASIC METABOLIC PANEL - Abnormal; Notable for the following:  Result Value   Potassium 3.4 (*)    CO2 20 (*)    Glucose, Bld 294 (*)    BUN 24 (*)    All other components within normal limits  CBC - Abnormal; Notable for the following:    Hemoglobin 10.8 (*)    HCT 33.9 (*)    MCV 76.9 (*)    MCH 24.5 (*)    MCHC 31.9 (*)    RDW 18.6 (*)    Platelets 141 (*)    All other components within normal limits  MAGNESIUM - Abnormal; Notable for the following:    Magnesium 1.6 (*)    All other components within normal limits  BASIC METABOLIC PANEL - Abnormal; Notable for the following:    CO2 19 (*)    Glucose, Bld 346 (*)    BUN 24 (*)    Calcium 8.6 (*)    All other components within normal limits  CBC - Abnormal; Notable for the following:    Hemoglobin 10.2 (*)    HCT 32.3 (*)    MCV 77.6 (*)    MCH 24.6 (*)    MCHC 31.6 (*)    RDW 18.7 (*)    Platelets 118 (*)    All other components within normal limits  GLUCOSE, CAPILLARY - Abnormal; Notable for the following:    Glucose-Capillary 329 (*)    All other components within normal limits  TROPONIN I  URINALYSIS COMPLETEWITH MICROSCOPIC ([facility] ONLY)   ____________________________________________  EKG  ED ECG  REPORT I, SUNG,JADE J, the attending physician, personally viewed and interpreted this ECG.   Date: 07/09/2016  EKG Time: 0130  Rate: 105  Rhythm: atrial fibrillation, rate 105  Axis: Normal  Intervals:none  ST&T Change: Nonspecific  ____________________________________________  RADIOLOGY  Chest 2 view (viewed by me, interpreted per Dr. Alroy Dust): No active cardiopulmonary disease. ____________________________________________   PROCEDURES  Procedure(s) performed: None  Procedures  Critical Care performed: Yes, see critical care note(s)   CRITICAL CARE Performed by: Paulette Blanch   Total critical care time: 30 minutes  Critical care time was exclusive of separately billable procedures and treating other patients.  Critical care was necessary to treat or prevent imminent or life-threatening deterioration.  Critical care was time spent personally by me on the following activities: development of treatment plan with patient and/or surrogate as well as nursing, discussions with consultants, evaluation of patient's response to treatment, examination of patient, obtaining history from patient or surrogate, ordering and performing treatments and interventions, ordering and review of laboratory studies, ordering and review of radiographic studies, pulse oximetry and re-evaluation of patient's condition.  ____________________________________________   INITIAL IMPRESSION / ASSESSMENT AND PLAN / ED COURSE  Pertinent labs & imaging results that were available during my care of the patient were reviewed by me and considered in my medical decision making (see chart for details).  60 year old female with gastroparesis who presents with abdominal pain, nausea, vomiting greater than diarrhea. Patient also has a history of QTC syndrome. Will proceed with caution giving Zofran.  Clinical Course  Comment By Time  Actively dry heaving. Will administer Reglan.  Paulette Blanch, MD 10/30 [phone]    Addendum on chart review: Patient continued to dry heave intractably. Discuss with hospitalist Dr. Jannifer Franklin who evaluated patient in the emergency department for admission. Paulette Blanch, MD 10/30 [phone]     ____________________________________________   FINAL CLINICAL IMPRESSION(S) / ED DIAGNOSES  Final diagnoses:  Epigastric pain  Gastroparesis  Intractable vomiting  with nausea, unspecified vomiting type  Essential hypertension      NEW MEDICATIONS STARTED DURING THIS VISIT:  Current Discharge Medication List       Note:  This document was prepared using Dragon voice recognition software and may include unintentional dictation errors.    Paulette Blanch, MD 07/09/16 [phone]

## 2016-07-09 NOTE — ED Triage Notes (Addendum)
Pt c/o abdominal pain, n/v/d x 3 days. Pt c/o chest pain that is sharp and reproducible w/ inspiration x 1 day. Pt states she has been able to take meds, but cannot keep food down. Pt c/o urinary frequency and hematuria that is new.

## 2016-07-09 NOTE — Progress Notes (Signed)
CH was making rounds when he visited Pt, but Pt was about to go to the bathroom and was waiting to a nurse. D[place] called a nurse to help Pt go to the bathroom, but couldn't wait for the Pt after that. [place] plans to visit the Pt again at another time.    07/09/16 1600  Clinical Encounter Type  Visited With Patient  Visit Type Initial;Spiritual support  Spiritual Encounters  Spiritual Needs Prayer

## 2016-07-09 NOTE — Care Management (Signed)
Updated patient on scheduled outpatient follow up with PCP.  Patient states "I am sorry I didn't go before, I will go to this one"

## 2016-07-09 NOTE — Care Management (Signed)
Patient with multiple admissions for Gastroparesis.  Patient lives at home with husband.  Has RW, cane, Shower chair in the home.  [facility].  Patient does not have PCP.  Patient does not have PCP.  Patient has been offered resources multiple times to set up PCP and patient has not followed up.  Previous admission this writer attempted to set up new patient appointment with PCP at discharge, and patient declined.  I have called [facility], their phone line is currently not working.  I have also  Attempted to contact [facility] as well to set up a new patient appointment.  I was unable to reach anyone at this time.  Will re attempt after lunch.

## 2016-07-09 NOTE — H&P (Signed)
[place] [facility] [place] NAME: [name]    MR#:  [id number]  DATE OF BIRTH:  Sep 16, 1955  DATE OF ADMISSION:  07/09/2016  PRIMARY CARE PHYSICIAN: No PCP Per Patient   REQUESTING/REFERRING PHYSICIAN: Beather Arbour, MD  CHIEF COMPLAINT:   Chief Complaint  Patient presents with  . Abdominal Pain  . Chest Pain    HISTORY OF PRESENT ILLNESS:  [name]  is a 60 y.o. female who presents with Gastroparesis. Patient has a known history of gastroparesis with recurrent flareups requiring hospitalization. She states that for the past 3-4 days she's been dealing with increasing nausea vomiting and abdominal pain. Her symptoms got much worse over the last 24 hours and so she came to the ED for evaluation. Here she has had nausea and vomiting difficult to control despite multiple rounds of antiemetics.  PAST MEDICAL HISTORY:   Past Medical History:  Diagnosis Date  . Allergy   . Anxiety   . Cancer (HCC)    HX OF CANCER OF UTERUS   . Cirrhosis of liver not due to alcohol ([place]) 2014-11-15  . Degenerative disk disease   . Diverticulitis   . Gastroparesis   . GERD (gastroesophageal reflux disease)   . History of hiatal hernia   . Hypertension   . Hypothyroid   . Intussusception intestine ([place]) 05/2015  . PAF (paroxysmal atrial fibrillation) ([place]) 03/2015   a. new onset 03/2015 in setting of intractable N/V; b. on Eliquis 5 mg bid; c. CHADSVASc 4 (DM, TIA x 2, female)  . Pancreatitis   . Sick sinus syndrome ([place])   . Stomach ulcer   . Stroke [facility])    with minimal left sided weakness  . Syncope 01/2015  . TIA (transient ischemic attack) 02/2015  . Type 1 diabetes ([place])    on levemir    PAST SURGICAL HISTORY:   Past Surgical History:  Procedure Laterality Date  . ABDOMINAL HYSTERECTOMY    . CARDIAC CATHETERIZATION N/A 01/12/2016   Procedure: Left Heart Cath and Coronary Angiography;  Surgeon: Wellington Hampshire, MD;  Location: [facility]  LAB;  Service: Cardiovascular;  Laterality: N/A;  . CHOLECYSTECTOMY    . ESOPHAGOGASTRODUODENOSCOPY N/A 04/04/2015   Procedure: ESOPHAGOGASTRODUODENOSCOPY (EGD);  Surgeon: Hulen Luster, MD;  Location: [facility] ENDOSCOPY;  Service: Endoscopy;  Laterality: N/A;  . ESOPHAGOGASTRODUODENOSCOPY (EGD) WITH PROPOFOL N/A 01/18/2016   Procedure: ESOPHAGOGASTRODUODENOSCOPY (EGD) WITH PROPOFOL;  Surgeon: Lucilla Lame, MD;  Location: [facility] ENDOSCOPY;  Service: Endoscopy;  Laterality: N/A;  . FLEXIBLE SIGMOIDOSCOPY N/A 01/18/2016   Procedure: FLEXIBLE SIGMOIDOSCOPY;  Surgeon: Lucilla Lame, MD;  Location: [facility] ENDOSCOPY;  Service: Endoscopy;  Laterality: N/A;  . HERNIA REPAIR      SOCIAL HISTORY:   Social History  Substance Use Topics  . Smoking status: Never Smoker  . Smokeless tobacco: Never Used  . Alcohol use No    FAMILY HISTORY:   Family History  Problem Relation Age of Onset  . Hypertension Mother   . CAD Sister   . Heart attack Sister     Deceased 11-15-14  . CAD Brother     DRUG ALLERGIES:   Allergies  Allergen Reactions  . Hydrocodone Other (See Comments)    Pt states that this medication caused cirrhosis of the liver.    . Aspirin   . Erythromycin Other (See Comments)    Reaction:  Fever   . Prednisone Other (See Comments)    Reaction:  Unknown   .  Rosiglitazone Maleate Swelling  . Codeine Sulfate Rash  . Tetanus-Diphtheria Toxoids Td Rash and Other (See Comments)    Reaction:  Fever     MEDICATIONS AT HOME:   Prior to Admission medications   Medication Sig Start Date End Date Taking? Authorizing Provider  acidophilus (RISAQUAD) CAPS capsule Take 2 capsules by mouth daily. 01/25/16   Bettey Costa, MD  albuterol (PROVENTIL HFA;VENTOLIN HFA) 108 (90 Base) MCG/ACT inhaler Inhale 2 puffs into the lungs every 6 (six) hours as needed for wheezing or shortness of breath. 05/15/16   Epifanio Lesches, MD  apixaban (ELIQUIS) 5 MG TABS tablet Take 1 tablet (5 mg total) by mouth 2 (two) times  daily. 05/15/16   Epifanio Lesches, MD  atorvastatin (LIPITOR) 40 MG tablet Take 1 tablet (40 mg total) by mouth at bedtime. 05/15/16   Epifanio Lesches, MD  bismuth subsalicylate (PEPTO BISMOL) 262 MG/15ML suspension Take 30 mLs by mouth every 4 (four) hours as needed for diarrhea or loose stools. 01/25/16   Bettey Costa, MD  budesonide-formoterol (SYMBICORT) 160-4.5 MCG/ACT inhaler Inhale 2 puffs into the lungs 2 (two) times daily. 05/15/16   Epifanio Lesches, MD  dicyclomine (BENTYL) 10 MG capsule Take 1 capsule (10 mg total) by mouth 4 (four) times daily -  before meals and at bedtime. 06/06/16 07/06/16  Lytle Butte, MD  diphenhydrAMINE (BENADRYL) 25 mg capsule Take 2 capsules (50 mg total) by mouth every 6 (six) hours as needed. Patient taking differently: Take 50 mg by mouth every 6 (six) hours as needed for itching or allergies.  02/09/16   Carrie Mew, MD  feeding supplement, GLUCERNA SHAKE, (GLUCERNA SHAKE) LIQD Take 237 mLs by mouth 3 (three) times daily between meals. 05/15/16   Epifanio Lesches, MD  insulin aspart (NOVOLOG) 100 UNIT/ML injection Inject 5 Units into the skin 3 (three) times daily with meals as needed for high blood sugar. Pt uses as needed per sliding scale. 05/15/16   Epifanio Lesches, MD  insulin detemir (LEVEMIR) 100 UNIT/ML injection Inject 0.05 mLs (5 Units total) into the skin at bedtime. Patient taking differently: Inject 17 Units into the skin at bedtime.  05/15/16   Epifanio Lesches, MD  levothyroxine (SYNTHROID, LEVOTHROID) 75 MCG tablet Take 1 tablet (75 mcg total) by mouth daily before breakfast. 05/15/16   Epifanio Lesches, MD  metoCLOPramide (REGLAN) 10 MG tablet Take 1 tablet (10 mg total) by mouth 4 (four) times daily -  before meals and at bedtime. 05/15/16   Epifanio Lesches, MD  morphine (MS CONTIN) 30 MG 12 hr tablet Take 1 tablet (30 mg total) by mouth every 12 (twelve) hours. 06/20/16 07/20/16  Lytle Butte, MD  ondansetron (ZOFRAN ODT) 4 MG  disintegrating tablet Take 1 tablet (4 mg total) by mouth every 8 (eight) hours as needed for nausea or vomiting. 05/15/16   Epifanio Lesches, MD  oxyCODONE (OXY IR/ROXICODONE) 5 MG immediate release tablet Take 2 tablets (10 mg total) by mouth every 4 (four) hours as needed for moderate pain. 06/20/16 07/20/16  Lytle Butte, MD  pantoprazole (PROTONIX) 40 MG tablet Take 1 tablet (40 mg total) by mouth 2 (two) times daily before a meal. 05/15/16   Epifanio Lesches, MD  rifaximin (XIFAXAN) 550 MG TABS tablet Take 1 tablet (550 mg total) by mouth 2 (two) times daily. 05/15/16   Epifanio Lesches, MD  spironolactone (ALDACTONE) 25 MG tablet Take 1 tablet (25 mg total) by mouth daily. 05/15/16   Epifanio Lesches, MD  tiZANidine (ZANAFLEX)  4 MG tablet Take 1 tablet (4 mg total) by mouth 3 (three) times daily as needed for muscle spasms. 05/15/16   Epifanio Lesches, MD    REVIEW OF SYSTEMS:  Review of Systems  Constitutional: Negative for chills, fever, malaise/fatigue and weight loss.  HENT: Negative for ear pain, hearing loss and tinnitus.   Eyes: Negative for blurred vision, double vision, pain and redness.  Respiratory: Negative for cough, hemoptysis and shortness of breath.   Cardiovascular: Negative for chest pain, palpitations, orthopnea and leg swelling.  Gastrointestinal: Positive for abdominal pain, nausea and vomiting. Negative for constipation and diarrhea.  Genitourinary: Negative for dysuria, frequency and hematuria.  Musculoskeletal: Negative for back pain, joint pain and neck pain.  Skin:       No acne, rash, or lesions  Neurological: Negative for dizziness, tremors, focal weakness and weakness.  Endo/Heme/Allergies: Negative for polydipsia. Does not bruise/bleed easily.  Psychiatric/Behavioral: Negative for depression. The patient is not nervous/anxious and does not have insomnia.      VITAL SIGNS:   Vitals:   07/09/16 0127 07/09/16 0128 07/09/16 0217 07/09/16 0230  BP:  (!) 210/102  (!) 189/103 (!) 206/110  Pulse: (!) 105  (!) 114 (!) 111  Resp: 20  10 13   Temp: 98.3 F (36.8 C)     TempSrc: Oral     SpO2: 100%  97% 97%  Weight:  61.2 kg (135 lb)    Height:  5' 3"  (1.6 m)     Wt Readings from Last 3 Encounters:  07/09/16 61.2 kg (135 lb)  06/17/16 61.2 kg (135 lb)  06/06/16 64.4 kg (141 lb 14.4 oz)    PHYSICAL EXAMINATION:  Physical Exam  Vitals reviewed. Constitutional: She is oriented to person, place, and time. She appears well-developed and well-nourished. No distress.  HENT:  Head: Normocephalic and atraumatic.  Mouth/Throat: Oropharynx is clear and moist.  Eyes: Conjunctivae and EOM are normal. Pupils are equal, round, and reactive to light. No scleral icterus.  Neck: Normal range of motion. Neck supple. No JVD present. No thyromegaly present.  Cardiovascular: Normal rate, regular rhythm and intact distal pulses.  Exam reveals no gallop and no friction rub.   No murmur heard. Respiratory: Effort normal and breath sounds normal. No respiratory distress. She has no wheezes. She has no rales.  GI: Soft. Bowel sounds are normal. She exhibits no distension. There is tenderness.  Musculoskeletal: Normal range of motion. She exhibits no edema.  No arthritis, no gout  Lymphadenopathy:    She has no cervical adenopathy.  Neurological: She is alert and oriented to person, place, and time. No cranial nerve deficit.  No dysarthria, no aphasia  Skin: Skin is warm and dry. No rash noted. No erythema.  Psychiatric: She has a normal mood and affect. Her behavior is normal. Judgment and thought content normal.    LABORATORY PANEL:   CBC  Recent Labs Lab 07/09/16 0132  WBC 5.3  HGB 10.8*  HCT 33.9*  PLT 141*   ------------------------------------------------------------------------------------------------------------------  Chemistries   Recent Labs Lab 07/09/16 0132  NA 137  K 3.4*  CL 106  CO2 20*  GLUCOSE 294*  BUN 24*   CREATININE 0.88  CALCIUM 9.2  MG 1.6*   ------------------------------------------------------------------------------------------------------------------  Cardiac Enzymes  Recent Labs Lab 07/09/16 0132  TROPONINI <0.03   ------------------------------------------------------------------------------------------------------------------  RADIOLOGY:  Dg Chest 2 View  Result Date: 07/09/2016 CLINICAL DATA:  Upper abdominal pain for 3 or 4 days. Mid left chest pain since yesterday. Dyspnea  onset today. EXAM: CHEST  2 VIEW COMPARISON:  01/28/2016 FINDINGS: The lungs are clear. The pulmonary vasculature is normal. Heart size is normal. Hilar and mediastinal contours are unremarkable. There is no pleural effusion. IMPRESSION: No active cardiopulmonary disease. Electronically Signed   By: Andreas Newport M.D.   On: 07/09/2016 02:27    EKG:   Orders placed or performed during the hospital encounter of 07/09/16  . EKG 12-Lead  . EKG 12-Lead  . ED EKG within 10 minutes  . ED EKG within 10 minutes    IMPRESSION AND PLAN:  Principal Problem:   Gastroparesis - Continue home dose scheduled Reglan, when necessary IV antiemetics, home dose pain control, try to avoid narcotic use as this may further exacerbate her condition. IV fluids, nothing by mouth for now Active Problems:   Prolonged QT interval - Monitor daily with EKG   Essential hypertension - stable, continue home meds   Uncontrolled type 2 diabetes mellitus (HCC) - sliding scale insulin with corresponding glucose checks   Paroxysmal atrial fibrillation (HCC) - continue home meds including anticoagulation   GERD - home dose PPI   Hypothyroidism due to amiodarone - home dose thyroid replacement  All the records are reviewed and case discussed with ED provider. Management plans discussed with the patient and/or family.  DVT PROPHYLAXIS: Systemic anticoagulation  GI PROPHYLAXIS: PPI  ADMISSION STATUS: Inpatient  CODE STATUS:  Full Code Status History    Date Active Date Inactive Code Status Order ID Comments User Context   06/17/2016 10:45 PM 06/20/2016  3:09 PM Full Code [id number]  Lance Coon, MD Inpatient   06/02/2016  5:46 AM 06/06/2016  6:21 PM DNR [id number]  Harrie Foreman, MD Inpatient   05/12/2016 11:43 AM 05/16/2016  4:38 PM DNR [id number]  Dustin Flock, MD Inpatient   05/12/2016  1:13 AM 05/12/2016 11:43 AM Full Code [id number]  Harvie Bridge, DO Inpatient   04/07/2016  6:18 AM 04/09/2016  2:19 PM DNR [id number]  Harrie Foreman, MD Inpatient   03/13/2016  9:25 PM 03/16/2016  6:32 PM DNR [id number]  Lance Coon, MD Inpatient   02/04/2016  9:39 AM 02/07/2016  2:59 PM DNR [id number]  Saundra Shelling, MD Inpatient   01/21/2016 10:24 PM 01/25/2016  5:02 PM DNR [id number]  Lance Coon, MD Inpatient   01/15/2016  1:39 PM 01/19/2016  3:29 PM DNR [id number]  Gladstone Lighter, MD ED   01/10/2016 10:00 PM 01/13/2016  5:24 PM DNR [id number]  Harrie Foreman, MD Inpatient   12/27/2015 11:02 PM 01/01/2016  6:17 PM DNR [id number]  Max Sane, MD Inpatient   12/16/2015  4:54 PM 12/19/2015  4:52 PM DNR [id number]  Loletha Grayer, MD ED   11/11/2015 11:36 AM 11/16/2015  2:05 PM Full Code [id number]  Samella Parr, NP Inpatient   10/15/2015  1:23 AM 10/16/2015  6:17 PM Full Code [id number]  Lance Coon, MD Inpatient   10/04/2015  8:19 PM 10/08/2015  6:34 PM Full Code [id number]  Bethena Roys, MD Inpatient   08/15/2015  1:05 AM 08/16/2015  8:04 PM Full Code [id number]  Harrie Foreman, MD Inpatient   08/01/2015  1:25 AM 08/06/2015  5:41 PM Full Code [id number]  Harrie Foreman, MD Inpatient   07/04/2015  2:53 AM 07/11/2015  6:33 PM Full Code [id number]  Lytle Butte, MD ED   05/27/2015 10:48 PM 05/31/2015  2:37 PM Full Code [id number]  Rise Patience, MD Inpatient   05/19/2015  1:14 AM 05/22/2015  4:28 PM Full Code [id number]  Rise Patience, MD Inpatient   04/05/2015  9:16 AM 04/07/2015  5:23 PM Full Code [id number]  Bettey Costa, MD Inpatient    04/03/2015 12:31 PM 04/05/2015  9:16 AM Full Code [id number]  Bettey Costa, MD Inpatient   03/11/2015  4:49 PM 03/14/2015  3:50 PM Full Code [id number]  Dustin Flock, MD Inpatient   02/15/2015  4:51 PM 02/17/2015  2:33 PM DNR [id number]  Dustin Flock, MD Inpatient   02/15/2015  4:35 PM 02/15/2015  4:51 PM Full Code [id number]  Dustin Flock, MD Inpatient   01/11/2015  2:40 AM 01/12/2015  6:11 PM Full Code [id number]  Etta Quill, DO ED    Advance Directive Documentation   Flowsheet Row Most Recent Value  Type of Advance Directive  Healthcare Power of Attorney  Pre-existing out of facility DNR order (yellow form or pink MOST form)  No data  "MOST" Form in Place?  No data      TOTAL TIME TAKING CARE OF THIS PATIENT: 45 minutes.    Wally Behan Wel[place] 07/09/2016, 3:56 AM  [facility]  Office  [phone]  CC: Primary care physician; No PCP Per Patient

## 2016-07-09 NOTE — ED Notes (Signed)
Pt returned from xray via stretcher.

## 2016-07-09 NOTE — ED Notes (Signed)
Pt stated she took her BP medication tonight, denies being out of any of her medicine.   Pt states chest pain near L breast that worsens when she takes a deep breath. Abdominal pain, N&V, diarrhea x 3 days. Pt dry heaving in room. Pt also c/o blood in urine. Denies kidney stone or UTI hx.   EMS gave 1 SL nitro, did not give aspirin d/t pt not have dentures.

## 2016-07-09 NOTE — Progress Notes (Signed)
Notified attending MD about patient having elevated BP 203/109 and HR 138. New orders received. Notified patient Primary RN. Metoprolol Medication given. Continue to assess.   Noel Gerold., RN 07/09/16  [phone]

## 2016-07-09 NOTE — ED Notes (Signed)
Pt transported to xray via stretcher

## 2016-07-09 NOTE — Progress Notes (Signed)
[name] NAME: [name]    MR#:  [id number]  DATE OF BIRTH:  1956-04-15  SUBJECTIVE:   Pt has been coming to the hospital every month for same complaints of Nausea and vomiting Patient has no PCP. She has had multiple admissions to the hospital for same complaints of nausea vomiting and abdominal pain. She has been asking every 15 minutes for IV pain medicine. REVIEW OF SYSTEMS:   Review of Systems  Constitutional: Negative for chills, fever and weight loss.  HENT: Negative for ear discharge, ear pain and nosebleeds.   Eyes: Negative for blurred vision, pain and discharge.  Respiratory: Negative for sputum production, shortness of breath, wheezing and stridor.   Cardiovascular: Negative for chest pain, palpitations, orthopnea and PND.  Gastrointestinal: Positive for abdominal pain and nausea. Negative for diarrhea and vomiting.  Genitourinary: Negative for frequency and urgency.  Musculoskeletal: Negative for back pain and joint pain.  Neurological: Negative for sensory change, speech change, focal weakness and weakness.  Psychiatric/Behavioral: Negative for depression and hallucinations. The patient is not nervous/anxious.    Tolerating Diet:npo Tolerating PT: ambulatory  DRUG ALLERGIES:   Allergies  Allergen Reactions  . Hydrocodone Other (See Comments)    Pt states that this medication caused cirrhosis of the liver.    . Aspirin   . Erythromycin Other (See Comments)    Reaction:  Fever   . Prednisone Other (See Comments)    Reaction:  Unknown   . Rosiglitazone Maleate Swelling  . Codeine Sulfate Rash  . Tetanus-Diphtheria Toxoids Td Rash and Other (See Comments)    Reaction:  Fever     VITALS:  Blood pressure (!) 193/83, pulse 100, temperature 98.3 F (36.8 C), temperature source Oral, resp. rate 20, height 5' 3"  (1.6 m), weight 61.2 kg (135 lb), SpO2 99 %.  PHYSICAL EXAMINATION:   Physical  Exam  GENERAL:  60 y.o.-year-old patient lying in the bed with no acute distress.  EYES: Pupils equal, round, reactive to light and accommodation. No scleral icterus. Extraocular muscles intact.  HEENT: Head atraumatic, normocephalic. Oropharynx and nasopharynx clear.  NECK:  Supple, no jugular venous distention. No thyroid enlargement, no tenderness.  LUNGS: Normal breath sounds bilaterally, no wheezing, rales, rhonchi. No use of accessory muscles of respiration.  CARDIOVASCULAR: S1, S2 normal. No murmurs, rubs, or gallops.  ABDOMEN: Soft, nontender, nondistended. Bowel sounds present. No organomegaly or mass.  EXTREMITIES: No cyanosis, clubbing or edema b/l.    NEUROLOGIC: Cranial nerves II through XII are intact. No focal Motor or sensory deficits b/l.   PSYCHIATRIC:  patient is alert and oriented x 3.  SKIN: No obvious rash, lesion, or ulcer.   LABORATORY PANEL:  CBC  Recent Labs Lab 07/09/16 0543  WBC 5.1  HGB 10.2*  HCT 32.3*  PLT 118*    Chemistries   Recent Labs Lab 07/09/16 0132 07/09/16 0543  NA 137 137  K 3.4* 3.7  CL 106 109  CO2 20* 19*  GLUCOSE 294* 346*  BUN 24* 24*  CREATININE 0.88 0.80  CALCIUM 9.2 8.6*  MG 1.6*  --    Cardiac Enzymes  Recent Labs Lab 07/09/16 0132  TROPONINI <0.03   RADIOLOGY:  Dg Chest 2 View  Result Date: 07/09/2016 CLINICAL DATA:  Upper abdominal pain for 3 or 4 days. Mid left chest pain since yesterday. Dyspnea onset today. EXAM: CHEST  2 VIEW COMPARISON:  01/28/2016 FINDINGS: The lungs are clear. The  pulmonary vasculature is normal. Heart size is normal. Hilar and mediastinal contours are unremarkable. There is no pleural effusion. IMPRESSION: No active cardiopulmonary disease. Electronically Signed   By: Andreas Newport M.D.   On: 07/09/2016 02:27   ASSESSMENT AND PLAN:  [name]  is a 60 y.o. female who presents with Gastroparesis. Patient has a known history of gastroparesis with recurrent flareups requiring  hospitalization. She states that for the past 3-4 days she's been dealing with increasing nausea vomiting and abdominal pain. Her symptoms got much worse over the last 24 hours and so she came to the ED for evaluation  1. Chronic Gastroparesis  - Continue home dose scheduled Reglan, when necessary IV antiemetics, home dose pain control, try to avoid narcotic use as this may further exacerbate her condition.  -IV fluids -Start clear liquid diet. Advance as tolerated.   2. Prolonged QT interval - Monitor daily with EKG resolved -d/c tele -K repleted   3 chronic abdominal pain with chronic narcotic dependence seeking pain medications  -Patient has history of noncompliance. She does not have a primary care physician. -She has been in the emergency room every single month and has been admitted multiple times this year for similar symptoms of nausea vomiting and abdominal pain -Patient advised to get primary care physician. We'll ask care manager for help. Primary care physician and discuss if she can get to pain clinic to avoid these hospital admissions for pain -Continue MS Contin 30 mg twice a day and oxycodone 10 mg every 6 hourly. -I have told patient I will not be giving her any more IV pain medicine but 1 dose of IV Toradol she is okay with that  4. Essential hypertension - stable, continue home meds  5  Uncontrolled type 2 diabetes mellitus (HCC) - sliding scale insulin with corresponding glucose checks  6. Paroxysmal atrial fibrillation (HCC) - continue home meds including anticoagulation  7.  GERD - home dose PPI   8. Hypothyroidism due to amiodarone - home dose thyroid replacement   Case discussed with Care Management/Social Worker. Management plans discussed with the patient, family and they are in agreement.  CODE STATUS: full  DVT Prophylaxis:eliquis  TOTAL TIME TAKING CARE OF THIS PATIENT: 32mnutes.  >50% time spent on counselling and coordination of care  POSSIBLE  D/C IN 1DAYS, DEPENDING ON CLINICAL CONDITION.  Note: This dictation was prepared with Dragon dictation along with smaller phrase technology. Any transcriptional errors that result from this process are unintentional.  Keisi Eckford M.D on 07/09/2016 at 11:14 AM  Between 7am to 6pm - Pager - [phone]  After 6pm go to [email or web address] - password [email or web address] [facility]  Office  [phone] CC: Primary care physician; No PCP Per Patient

## 2016-07-09 NOTE — ED Notes (Signed)
Pt continues to dry heave. Emesis bag in hand.

## 2016-07-09 NOTE — Care Management (Signed)
Patient set up with post hospital primary follow up for 07/18/16.  Added to discharge AVS

## 2016-07-09 NOTE — Progress Notes (Signed)
Inpatient Diabetes Program Recommendations  AACE/ADA: New Consensus Statement on Inpatient Glycemic Control (2015)  Target Ranges:  Prepandial:   less than 140 mg/dL      Peak postprandial:   less than 180 mg/dL (1-2 hours)      Critically ill patients:  140 - 180 mg/dL   Lab Results  Component Value Date   GLUCAP 329 (H) 07/09/2016   HGBA1C 8.1 (H) 06/18/2016    Review of Glycemic Control:  Results for [name], [name] (MRN [id number]) as of 07/09/2016 09:40  Ref. Range 06/19/2016 11:30 06/19/2016 18:08 06/20/2016 00:00 06/20/2016 05:48 07/09/2016 06:22  Glucose-Capillary Latest Ref Range: 65 - 99 mg/dL 206 (H) 165 (H) 224 (H) 244 (H) 329 (H)   Diabetes history: Per history Type 1 diabetes Outpatient Diabetes medications: Levemir 5 units q HS (patient states she was taking 17 units Levemir at home)  Current orders for Inpatient glycemic control: Novolog sensitive q 6 hours  Inpatient Diabetes Program Recommendations:    Text page sent to Dr. Posey Pronto recommending Levemir 8 units daily.    Thanks, Adah Perl, RN, BC-ADM Inpatient Diabetes Coordinator Pager [phone] (8a-5p)

## 2016-07-10 LAB — GLUCOSE, CAPILLARY
GLUCOSE-CAPILLARY: 171 mg/dL — AB (ref 65–99)
GLUCOSE-CAPILLARY: 187 mg/dL — AB (ref 65–99)
Glucose-Capillary: 182 mg/dL — ABNORMAL HIGH (ref 65–99)

## 2016-07-10 MED ORDER — CEPHALEXIN 500 MG PO CAPS
500.0000 mg | ORAL_CAPSULE | Freq: Two times a day (BID) | ORAL | Status: DC
  Administered 2016-07-10: 500 mg via ORAL
  Filled 2016-07-10: qty 1

## 2016-07-10 MED ORDER — METOPROLOL TARTRATE 25 MG PO TABS: 25.0000 mg | ORAL_TABLET | Freq: Two times a day (BID) | ORAL | 0 refills | Status: DC

## 2016-07-10 MED ORDER — MORPHINE SULFATE ER 30 MG PO TBCR: 30.0000 mg | EXTENDED_RELEASE_TABLET | Freq: Two times a day (BID) | ORAL | 0 refills | Status: DC

## 2016-07-10 MED ORDER — CEPHALEXIN 500 MG PO CAPS: 500.0000 mg | ORAL_CAPSULE | Freq: Two times a day (BID) | ORAL | 0 refills | Status: DC

## 2016-07-10 NOTE — Progress Notes (Addendum)
Inpatient Diabetes Program Recommendations  AACE/ADA: New Consensus Statement on Inpatient Glycemic Control (2015)  Target Ranges:  Prepandial:   less than 140 mg/dL      Peak postprandial:   less than 180 mg/dL (1-2 hours)      Critically ill patients:  140 - 180 mg/dL   Lab Results  Component Value Date   GLUCAP 171 (H) 07/10/2016   HGBA1C 8.1 (H) 06/18/2016    Review of Glycemic Control  Results for Coyle, Giada A (MRN [id number]) as of 07/10/2016 08:59  Ref. Range 07/09/2016 06:22 07/09/2016 12:10 07/09/2016 17:33 07/10/2016 00:36 07/10/2016 05:28  Glucose-Capillary Latest Ref Range: 65 - 99 mg/dL 329 (H) 334 (H) 279 (H) 182 (H) 171 (H)   Diabetes history: Per history Type 1 diabetes  Outpatient Diabetes medications: Levemir 5 units q HS (patient states she was taking 17 units Levemir at home), ordered Novolog 5 units tid for elevated CBG   Current orders for Inpatient glycemic control:Novolog sensitive correction q 6 hours, Levemir 8 units qhs  Inpatient Diabetes Program Recommendations: Met with patient at the bedside-  Patient is unable to tell me how much Novolog she takes at her meals (she says her husband gives her the insulin)  She admits to taking Novolog insulin 5-6 times a day prior to eating.  She says she was told to eat 5-6 times a day but only has an order for the Novolog tid- I have asked her to clarify this with her MD since Novolog insulin is active for about 4 hours- she does not currently have a family doctor.    , RN, BA, MHA, CDE Diabetes Coordinator Inpatient Diabetes Program  [phone] (Team Pager) [phone] ([facility] Office) 07/10/2016 9:50 AM       

## 2016-07-10 NOTE — Progress Notes (Signed)
Pt d/c home; d/c instructions reviewed w/ pt; pt understanding was verbalized; IV removed, catheter in tact, gauze dressing applied; all pt questions answered; pt verbalized that all pt belongings were accounted for; pt left unit via wheelchair accompanied by staff

## 2016-07-10 NOTE — Discharge Summary (Signed)
[name] at [place] NAME: [name]    MR#:  [id number]  DATE OF BIRTH:  August 08, 1956  DATE OF ADMISSION:  07/09/2016 ADMITTING PHYSICIAN: Lance Coon, MD  DATE OF DISCHARGE: 07/10/16 PRIMARY CARE PHYSICIAN: Sheral Flow, NP    ADMISSION DIAGNOSIS:  Gastroparesis [K31.84] Epigastric pain [R10.13] Essential hypertension [I10] Intractable vomiting with nausea, unspecified vomiting type [R11.2]  DISCHARGE DIAGNOSIS:  Nausea/vomiting due to gastroparesis-resolved Chronic abdominal pain-chronic narcotic use  SECONDARY DIAGNOSIS:   Past Medical History:  Diagnosis Date  . Allergy   . Anxiety   . Cancer (HCC)    HX OF CANCER OF UTERUS   . Cirrhosis of liver not due to alcohol ([place]) 2016  . Degenerative disk disease   . Diverticulitis   . Gastroparesis   . GERD (gastroesophageal reflux disease)   . History of hiatal hernia   . Hypertension   . Hypothyroid   . Intussusception intestine ([place]) 05/2015  . PAF (paroxysmal atrial fibrillation) ([place]) 03/2015   a. new onset 03/2015 in setting of intractable N/V; b. on Eliquis 5 mg bid; c. CHADSVASc 4 (DM, TIA x 2, female)  . Pancreatitis   . Sick sinus syndrome ([place])   . Stomach ulcer   . Stroke [facility])    with minimal left sided weakness  . Syncope 01/2015  . TIA (transient ischemic attack) 02/2015  . Type 1 diabetes ([place])    on [place]:  [name] a 60 y.o.femalewho presents with Gastroparesis. Patient has a known history of gastroparesis with recurrent flareups requiring hospitalization. She states that for the past 3-4 days she's been dealing with increasing nausea vomiting and abdominal pain. Her symptoms got much worse over the last 24 hours and so she came to the ED for evaluation  1. Chronic Gastroparesis  - Continue home dose scheduled Reglan, when necessary IV antiemetics, home dose pain control, try to avoid narcotic use as  this may further exacerbate her condition.  -received IV fluids -Started clear liquid diet. Advance as tolerated.  2.Prolonged QT interval - Monitor daily with EKG resolved -d/c tele -K repleted  3 chronic abdominal pain with chronic narcotic dependence seeking pain medications  -Patient has history of noncompliance. She does not have a primary care physician. -She has been in the emergency room every single month and has been admitted multiple times this year for similar symptoms of nausea vomiting and abdominal pain -Patient advised to get primary care physician. Pt has a PCP now with appt  Primary care physician and discuss if she can get to pain clinic to avoid these hospital admissions for pain -Continue MS Contin 30 mg twice a day and oxycodone 10 mg every 6 hourly. -I have told patient I will not be giving her any more IV pain medicine  4.Essential hypertension - stable, continue home meds  5Uncontrolled type 2 diabetes mellitus ([place]) - sliding scale insulin with corresponding glucose checks  6.Paroxysmal atrial fibrillation (HCC) - continue home meds including anticoagulation  7.UTI po keflex 5 days  8.Hypothyroidism due to amiodarone - home dose thyroid replacement   Overall stable D/c home CONSULTS OBTAINED:    DRUG ALLERGIES:   Allergies  Allergen Reactions  . Hydrocodone Other (See Comments)    Pt states that this medication caused cirrhosis of the liver.    . Aspirin   . Erythromycin Other (See Comments)    Reaction:  Fever   . Prednisone Other (See  Comments)    Reaction:  Unknown   . Rosiglitazone Maleate Swelling  . Codeine Sulfate Rash  . Tetanus-Diphtheria Toxoids Td Rash and Other (See Comments)    Reaction:  Fever     DISCHARGE MEDICATIONS:   Current Discharge Medication List    START taking these medications   Details  cephALEXin (KEFLEX) 500 MG capsule Take 1 capsule (500 mg total) by mouth every 12 (twelve) hours. Qty:  10 capsule, Refills: 0    metoprolol tartrate (LOPRESSOR) 25 MG tablet Take 1 tablet (25 mg total) by mouth 2 (two) times daily. Qty: 60 tablet, Refills: 0      CONTINUE these medications which have CHANGED   Details  morphine (MS CONTIN) 30 MG 12 hr tablet Take 1 tablet (30 mg total) by mouth every 12 (twelve) hours. Qty: 10 tablet, Refills: 0      CONTINUE these medications which have NOT CHANGED   Details  acidophilus (RISAQUAD) CAPS capsule Take 2 capsules by mouth daily. Qty: 60 capsule, Refills: 0    albuterol (PROVENTIL HFA;VENTOLIN HFA) 108 (90 Base) MCG/ACT inhaler Inhale 2 puffs into the lungs every 6 (six) hours as needed for wheezing or shortness of breath. Qty: 1 Inhaler, Refills: 2    apixaban (ELIQUIS) 5 MG TABS tablet Take 1 tablet (5 mg total) by mouth 2 (two) times daily. Qty: 60 tablet, Refills: 0    atorvastatin (LIPITOR) 40 MG tablet Take 1 tablet (40 mg total) by mouth at bedtime. Qty: 30 tablet, Refills: 0    budesonide-formoterol (SYMBICORT) 160-4.5 MCG/ACT inhaler Inhale 2 puffs into the lungs 2 (two) times daily. Qty: 1 Inhaler, Refills: 1    diphenhydrAMINE (BENADRYL) 25 mg capsule Take 2 capsules (50 mg total) by mouth every 6 (six) hours as needed. Qty: 60 capsule, Refills: 0    feeding supplement, GLUCERNA SHAKE, (GLUCERNA SHAKE) LIQD Take 237 mLs by mouth 3 (three) times daily between meals. Qty: 60 Can, Refills: 0    insulin aspart (NOVOLOG) 100 UNIT/ML injection Inject 5 Units into the skin 3 (three) times daily with meals as needed for high blood sugar. Pt uses as needed per sliding scale. Qty: 10 mL, Refills: 11    insulin detemir (LEVEMIR) 100 UNIT/ML injection Inject 0.05 mLs (5 Units total) into the skin at bedtime. Qty: 10 mL, Refills: 11    levothyroxine (SYNTHROID, LEVOTHROID) 75 MCG tablet Take 1 tablet (75 mcg total) by mouth daily before breakfast. Qty: 30 tablet, Refills: 1    metoCLOPramide (REGLAN) 10 MG tablet Take 1 tablet  (10 mg total) by mouth 4 (four) times daily -  before meals and at bedtime. Qty: 60 tablet, Refills: 0    ondansetron (ZOFRAN ODT) 4 MG disintegrating tablet Take 1 tablet (4 mg total) by mouth every 8 (eight) hours as needed for nausea or vomiting. Qty: 20 tablet, Refills: 0    oxyCODONE (OXY IR/ROXICODONE) 5 MG immediate release tablet Take 2 tablets (10 mg total) by mouth every 4 (four) hours as needed for moderate pain. Qty: 45 tablet, Refills: 0    pantoprazole (PROTONIX) 40 MG tablet Take 1 tablet (40 mg total) by mouth 2 (two) times daily before a meal. Qty: 30 tablet, Refills: 0    rifaximin (XIFAXAN) 550 MG TABS tablet Take 1 tablet (550 mg total) by mouth 2 (two) times daily. Qty: 60 tablet, Refills: 1    spironolactone (ALDACTONE) 25 MG tablet Take 1 tablet (25 mg total) by mouth daily. Qty: 30 tablet, Refills:  0    tiZANidine (ZANAFLEX) 4 MG tablet Take 1 tablet (4 mg total) by mouth 3 (three) times daily as needed for muscle spasms. Qty: 12 tablet, Refills: 0    bismuth subsalicylate (PEPTO BISMOL) 262 MG/15ML suspension Take 30 mLs by mouth every 4 (four) hours as needed for diarrhea or loose stools. Qty: 360 mL, Refills: 0        If you experience worsening of your admission symptoms, develop shortness of breath, life threatening emergency, suicidal or homicidal thoughts you must seek medical attention immediately by calling 911 or calling your MD immediately  if symptoms less severe.  You Must read complete instructions/literature along with all the possible adverse reactions/side effects for all the Medicines you take and that have been prescribed to you. Take any new Medicines after you have completely understood and accept all the possible adverse reactions/side effects.   Please note  You were cared for by a hospitalist during your hospital stay. If you have any questions about your discharge medications or the care you received while you were in the hospital  after you are discharged, you can call the unit and asked to speak with the hospitalist on call if the hospitalist that took care of you is not available. Once you are discharged, your primary care physician will handle any further medical issues. Please note that NO REFILLS for any discharge medications will be authorized once you are discharged, as it is imperative that you return to your primary care physician (or establish a relationship with a primary care physician if you do not have one) for your aftercare needs so that they can reassess your need for medications and monitor your lab values. Today   SUBJECTIVE   Doing ok  VITAL SIGNS:  Blood pressure (!) 157/75, pulse (!) 111, temperature 99.6 F (37.6 C), temperature source Oral, resp. rate 19, height 5' 3"  (1.6 m), weight 61.2 kg (135 lb), SpO2 100 %.  I/O:   Intake/Output Summary (Last 24 hours) at 07/10/16 1046 Last data filed at 07/10/16 0901  Gross per 24 hour  Intake           868.51 ml  Output             2050 ml  Net         -1181.49 ml    PHYSICAL EXAMINATION:  GENERAL:  60 y.o.-year-old patient lying in the bed with no acute distress.  EYES: Pupils equal, round, reactive to light and accommodation. No scleral icterus. Extraocular muscles intact.  HEENT: Head atraumatic, normocephalic. Oropharynx and nasopharynx clear.  NECK:  Supple, no jugular venous distention. No thyroid enlargement, no tenderness.  LUNGS: Normal breath sounds bilaterally, no wheezing, rales,rhonchi or crepitation. No use of accessory muscles of respiration.  CARDIOVASCULAR: S1, S2 normal. No murmurs, rubs, or gallops.  ABDOMEN: Soft, non-tender, non-distended. Bowel sounds present. No organomegaly or mass.  EXTREMITIES: No pedal edema, cyanosis, or clubbing.  NEUROLOGIC: Cranial nerves II through XII are intact. Muscle strength 5/5 in all extremities. Sensation intact. Gait not checked.  PSYCHIATRIC: The patient is alert and oriented x 3.  SKIN:  No obvious rash, lesion, or ulcer.   DATA REVIEW:   CBC   Recent Labs Lab 07/09/16 0543  WBC 5.1  HGB 10.2*  HCT 32.3*  PLT 118*    Chemistries   Recent Labs Lab 07/09/16 0132 07/09/16 0543  NA 137 137  K 3.4* 3.7  CL 106 109  CO2 20* 19*  GLUCOSE 294* 346*  BUN 24* 24*  CREATININE 0.88 0.80  CALCIUM 9.2 8.6*  MG 1.6*  --     Microbiology Results   No results found for this or any previous visit (from the past 240 hour(s)).  RADIOLOGY:  Dg Chest 2 View  Result Date: 07/09/2016 CLINICAL DATA:  Upper abdominal pain for 3 or 4 days. Mid left chest pain since yesterday. Dyspnea onset today. EXAM: CHEST  2 VIEW COMPARISON:  01/28/2016 FINDINGS: The lungs are clear. The pulmonary vasculature is normal. Heart size is normal. Hilar and mediastinal contours are unremarkable. There is no pleural effusion. IMPRESSION: No active cardiopulmonary disease. Electronically Signed   By: Andreas Newport M.D.   On: 07/09/2016 02:27     Management plans discussed with the patient, family and they are in agreement.  CODE STATUS:     Code Status Orders        Start     Ordered   07/09/16 0454  Full code  Continuous     07/09/16 0453    Code Status History    Date Active Date Inactive Code Status Order ID Comments User Context   06/17/2016 10:45 PM 06/20/2016  3:09 PM Full Code [id number]  Lance Coon, MD Inpatient   06/02/2016  5:46 AM 06/06/2016  6:21 PM DNR [id number]  Harrie Foreman, MD Inpatient   05/12/2016 11:43 AM 05/16/2016  4:38 PM DNR [id number]  Dustin Flock, MD Inpatient   05/12/2016  1:13 AM 05/12/2016 11:43 AM Full Code [id number]  Harvie Bridge, DO Inpatient   04/07/2016  6:18 AM 04/09/2016  2:19 PM DNR [id number]  Harrie Foreman, MD Inpatient   03/13/2016  9:25 PM 03/16/2016  6:32 PM DNR [id number]  Lance Coon, MD Inpatient   02/04/2016  9:39 AM 02/07/2016  2:59 PM DNR [id number]  Saundra Shelling, MD Inpatient   01/21/2016 10:24 PM 01/25/2016  5:02 PM DNR [id number]   Lance Coon, MD Inpatient   01/15/2016  1:39 PM 01/19/2016  3:29 PM DNR [id number]  Gladstone Lighter, MD ED   01/10/2016 10:00 PM 01/13/2016  5:24 PM DNR [id number]  Harrie Foreman, MD Inpatient   12/27/2015 11:02 PM 01/01/2016  6:17 PM DNR [id number]  Max Sane, MD Inpatient   12/16/2015  4:54 PM 12/19/2015  4:52 PM DNR [id number]  Loletha Grayer, MD ED   11/11/2015 11:36 AM 11/16/2015  2:05 PM Full Code [id number]  Samella Parr, NP Inpatient   10/15/2015  1:23 AM 10/16/2015  6:17 PM Full Code [id number]  Lance Coon, MD Inpatient   10/04/2015  8:19 PM 10/08/2015  6:34 PM Full Code [id number]  Bethena Roys, MD Inpatient   08/15/2015  1:05 AM 08/16/2015  8:04 PM Full Code [id number]  Harrie Foreman, MD Inpatient   08/01/2015  1:25 AM 08/06/2015  5:41 PM Full Code [id number]  Harrie Foreman, MD Inpatient   07/04/2015  2:53 AM 07/11/2015  6:33 PM Full Code [id number]  Lytle Butte, MD ED   05/27/2015 10:48 PM 05/31/2015  2:37 PM Full Code [id number]  Rise Patience, MD Inpatient   05/19/2015  1:14 AM 05/22/2015  4:28 PM Full Code [id number]  Rise Patience, MD Inpatient   04/05/2015  9:16 AM 04/07/2015  5:23 PM Full Code [id number]  Bettey Costa, MD Inpatient   04/03/2015 12:31 PM 04/05/2015  9:16 AM Full Code [id number]  Bettey Costa, MD Inpatient   03/11/2015  4:49 PM 03/14/2015  3:50 PM Full Code [id number]  Dustin Flock, MD Inpatient   02/15/2015  4:51 PM 02/17/2015  2:33 PM DNR [id number]  Dustin Flock, MD Inpatient   02/15/2015  4:35 PM 02/15/2015  4:51 PM Full Code [id number]  Dustin Flock, MD Inpatient   01/11/2015  2:40 AM 01/12/2015  6:11 PM Full Code [id number]  Etta Quill, DO ED    Advance Directive Documentation   Flowsheet Row Most Recent Value  Type of Advance Directive  Healthcare Power of Attorney, Living will  Pre-existing out of facility DNR order (yellow form or pink MOST form)  No data  "MOST" Form in Place?  No data      TOTAL TIME TAKING CARE OF THIS PATIENT: 40 minutes.    Leanna Hamid M.D  on 07/10/2016 at 10:46 AM  Between 7am to 6pm - Pager - [phone] After 6pm go to [email or web address] - password [email or web address] [facility]  [place] [facility]  Office  [phone]  CC: Primary care physician; Sheral Flow, NP

## 2016-07-12 ENCOUNTER — Encounter: Payer: Self-pay | Admitting: *Deleted

## 2016-07-12 ENCOUNTER — Telehealth: Payer: Self-pay

## 2016-07-12 ENCOUNTER — Other Ambulatory Visit: Payer: Self-pay | Admitting: *Deleted

## 2016-07-12 NOTE — Telephone Encounter (Signed)
Transition Care Management Follow-up Telephone Call    Date discharged? 07/10/16  How have you been since you were released from the hospital? [place].   Any patient concerns? Pt is still experiencing gastric pain and hematuria. Pt was offered an earlier appt with PCP but declined due to transportation concerns. Pt was provided locations and hours for [facility] Urgent [facility] facilities. Pt was also encouraged to seek treatment at ER if there was a decline in health status.    Items Reviewed:  Medications reviewed: Yes  Allergies reviewed: Yes  Dietary changes reviewed: N/A  Referrals reviewed: N/A   Functional Questionnaire:  Independent - I Dependent - D    Activities of Daily Living (ADLs):    Personal hygiene - I Dressing - I Eating - I Maintaining continence - I Transferring - I   Independent Activities of Daily Living (iADLs): Basic communication skills - I Transportation - D (pt does not have a car at this time) Meal preparation  - I Shopping - I Housework - I Managing medications - I  Managing personal finances - I   Confirmed importance and date/time of follow-up visits scheduled YES  Provider Appointment booked with PCP 07/18/16  Confirmed with patient if condition begins to worsen call PCP or go to the ER.  Patient was given the office number and encouraged to call back with question or concerns: YES

## 2016-07-12 NOTE — Patient Outreach (Signed)
[facility]) Care Management         [facility] CM Telephone Outreach, Transition of care Attempt #1 ADASYN MCADAMS Jan 09, 1956 [id number]  Unsuccessful telephone outreach attempt to [name], 60 y/o female referred to [place] for transition of care after multiple recent hospitalizations.  Patient noted to have had 16 inpatient admissions in 2017, with 3 additional ED visits that resulted in patient discharge from the ED.  Patient's most recent hospitalization was October 30- 31, 2017 for chronic gastroparesis and abdominal pain.  Patient has history including, but not limited to HTN, HLD, uncontrolled DM type II, paroxysmal A-fib, C-difficile, diverticulitis, and gastroparesis.  Was unable to leave patient or her spouse voice mail message today, as phone rang without answer, and did not go to voicemail; call ended with continuous beeping noise and then automatic call disconnection.  Plan:  Will re-attempt telephone outreach to patient for transition of care tomorrow if I do not hear back from patient first.  [name], RN, BSN, CCRN [facility] Care Management  [phone]

## 2016-07-13 ENCOUNTER — Emergency Department: Payer: Commercial Managed Care - HMO

## 2016-07-13 ENCOUNTER — Encounter: Payer: Self-pay | Admitting: *Deleted

## 2016-07-13 ENCOUNTER — Other Ambulatory Visit: Payer: Self-pay | Admitting: *Deleted

## 2016-07-13 ENCOUNTER — Encounter: Payer: Self-pay | Admitting: Emergency Medicine

## 2016-07-13 ENCOUNTER — Observation Stay
Admission: EM | Admit: 2016-07-13 | Discharge: 2016-07-17 | Disposition: A | Discharge status: Home / Self Care | Payer: Commercial Managed Care - HMO | Attending: Internal Medicine | Admitting: Internal Medicine

## 2016-07-13 DIAGNOSIS — N201 Calculus of ureter: Secondary | ICD-10-CM | POA: Diagnosis present

## 2016-07-13 DIAGNOSIS — R188 Other ascites: Secondary | ICD-10-CM | POA: Diagnosis not present

## 2016-07-13 DIAGNOSIS — D62 Acute posthemorrhagic anemia: Secondary | ICD-10-CM | POA: Diagnosis not present

## 2016-07-13 DIAGNOSIS — E0590 Thyrotoxicosis, unspecified without thyrotoxic crisis or storm: Secondary | ICD-10-CM | POA: Diagnosis not present

## 2016-07-13 DIAGNOSIS — Z794 Long term (current) use of insulin: Secondary | ICD-10-CM | POA: Insufficient documentation

## 2016-07-13 DIAGNOSIS — N202 Calculus of kidney with calculus of ureter: Secondary | ICD-10-CM | POA: Diagnosis not present

## 2016-07-13 DIAGNOSIS — Z8711 Personal history of peptic ulcer disease: Secondary | ICD-10-CM | POA: Insufficient documentation

## 2016-07-13 DIAGNOSIS — I8500 Esophageal varices without bleeding: Secondary | ICD-10-CM | POA: Diagnosis not present

## 2016-07-13 DIAGNOSIS — K3184 Gastroparesis: Secondary | ICD-10-CM | POA: Diagnosis not present

## 2016-07-13 DIAGNOSIS — M545 Low back pain: Secondary | ICD-10-CM | POA: Insufficient documentation

## 2016-07-13 DIAGNOSIS — I509 Heart failure, unspecified: Secondary | ICD-10-CM | POA: Diagnosis not present

## 2016-07-13 DIAGNOSIS — I480 Paroxysmal atrial fibrillation: Secondary | ICD-10-CM | POA: Insufficient documentation

## 2016-07-13 DIAGNOSIS — E1043 Type 1 diabetes mellitus with diabetic autonomic (poly)neuropathy: Secondary | ICD-10-CM | POA: Insufficient documentation

## 2016-07-13 DIAGNOSIS — Z8249 Family history of ischemic heart disease and other diseases of the circulatory system: Secondary | ICD-10-CM | POA: Insufficient documentation

## 2016-07-13 DIAGNOSIS — I700 Atherosclerosis of aorta: Secondary | ICD-10-CM | POA: Diagnosis not present

## 2016-07-13 DIAGNOSIS — E039 Hypothyroidism, unspecified: Secondary | ICD-10-CM | POA: Diagnosis not present

## 2016-07-13 DIAGNOSIS — E785 Hyperlipidemia, unspecified: Secondary | ICD-10-CM | POA: Insufficient documentation

## 2016-07-13 DIAGNOSIS — Z8542 Personal history of malignant neoplasm of other parts of uterus: Secondary | ICD-10-CM | POA: Insufficient documentation

## 2016-07-13 DIAGNOSIS — Z885 Allergy status to narcotic agent status: Secondary | ICD-10-CM | POA: Insufficient documentation

## 2016-07-13 DIAGNOSIS — K219 Gastro-esophageal reflux disease without esophagitis: Secondary | ICD-10-CM | POA: Diagnosis not present

## 2016-07-13 DIAGNOSIS — E875 Hyperkalemia: Secondary | ICD-10-CM | POA: Insufficient documentation

## 2016-07-13 DIAGNOSIS — I420 Dilated cardiomyopathy: Secondary | ICD-10-CM | POA: Insufficient documentation

## 2016-07-13 DIAGNOSIS — Z886 Allergy status to analgesic agent status: Secondary | ICD-10-CM | POA: Insufficient documentation

## 2016-07-13 DIAGNOSIS — K449 Diaphragmatic hernia without obstruction or gangrene: Secondary | ICD-10-CM | POA: Insufficient documentation

## 2016-07-13 DIAGNOSIS — F329 Major depressive disorder, single episode, unspecified: Secondary | ICD-10-CM | POA: Insufficient documentation

## 2016-07-13 DIAGNOSIS — F419 Anxiety disorder, unspecified: Secondary | ICD-10-CM | POA: Diagnosis not present

## 2016-07-13 DIAGNOSIS — Z79891 Long term (current) use of opiate analgesic: Secondary | ICD-10-CM | POA: Diagnosis not present

## 2016-07-13 DIAGNOSIS — R197 Diarrhea, unspecified: Secondary | ICD-10-CM

## 2016-07-13 DIAGNOSIS — K7469 Other cirrhosis of liver: Secondary | ICD-10-CM | POA: Diagnosis not present

## 2016-07-13 DIAGNOSIS — K2970 Gastritis, unspecified, without bleeding: Secondary | ICD-10-CM | POA: Diagnosis not present

## 2016-07-13 DIAGNOSIS — R112 Nausea with vomiting, unspecified: Secondary | ICD-10-CM | POA: Diagnosis not present

## 2016-07-13 DIAGNOSIS — Z7901 Long term (current) use of anticoagulants: Secondary | ICD-10-CM | POA: Insufficient documentation

## 2016-07-13 DIAGNOSIS — G8929 Other chronic pain: Secondary | ICD-10-CM | POA: Diagnosis not present

## 2016-07-13 DIAGNOSIS — I495 Sick sinus syndrome: Secondary | ICD-10-CM | POA: Insufficient documentation

## 2016-07-13 DIAGNOSIS — Z8673 Personal history of transient ischemic attack (TIA), and cerebral infarction without residual deficits: Secondary | ICD-10-CM | POA: Insufficient documentation

## 2016-07-13 DIAGNOSIS — I252 Old myocardial infarction: Secondary | ICD-10-CM | POA: Insufficient documentation

## 2016-07-13 DIAGNOSIS — R1084 Generalized abdominal pain: Secondary | ICD-10-CM

## 2016-07-13 DIAGNOSIS — Z8719 Personal history of other diseases of the digestive system: Secondary | ICD-10-CM | POA: Insufficient documentation

## 2016-07-13 DIAGNOSIS — M1990 Unspecified osteoarthritis, unspecified site: Secondary | ICD-10-CM | POA: Insufficient documentation

## 2016-07-13 DIAGNOSIS — I110 Hypertensive heart disease with heart failure: Secondary | ICD-10-CM | POA: Diagnosis not present

## 2016-07-13 DIAGNOSIS — N132 Hydronephrosis with renal and ureteral calculous obstruction: Principal | ICD-10-CM | POA: Insufficient documentation

## 2016-07-13 DIAGNOSIS — K766 Portal hypertension: Secondary | ICD-10-CM | POA: Diagnosis not present

## 2016-07-13 DIAGNOSIS — Z9049 Acquired absence of other specified parts of digestive tract: Secondary | ICD-10-CM | POA: Insufficient documentation

## 2016-07-13 DIAGNOSIS — Z90710 Acquired absence of both cervix and uterus: Secondary | ICD-10-CM | POA: Insufficient documentation

## 2016-07-13 DIAGNOSIS — Z887 Allergy status to serum and vaccine status: Secondary | ICD-10-CM | POA: Insufficient documentation

## 2016-07-13 LAB — COMPREHENSIVE METABOLIC PANEL
ALBUMIN: 3.8 g/dL (ref 3.5–5.0)
ALK PHOS: 102 U/L (ref 38–126)
ALT: 24 U/L (ref 14–54)
ANION GAP: 10 (ref 5–15)
AST: 20 U/L (ref 15–41)
BILIRUBIN TOTAL: 1.3 mg/dL — AB (ref 0.3–1.2)
BUN: 12 mg/dL (ref 6–20)
CO2: 21 mmol/L — AB (ref 22–32)
CREATININE: 0.51 mg/dL (ref 0.44–1.00)
Calcium: 9.2 mg/dL (ref 8.9–10.3)
Chloride: 104 mmol/L (ref 101–111)
GFR calc Af Amer: >60 mL/min (ref >60)
GFR calc non Af Amer: >60 mL/min (ref >60)
GLUCOSE: 229 mg/dL — AB (ref 65–99)
Potassium: 3.1 mmol/L — ABNORMAL LOW (ref 3.5–5.1)
Sodium: 135 mmol/L (ref 135–145)
TOTAL PROTEIN: 6.8 g/dL (ref 6.5–8.1)

## 2016-07-13 LAB — CBC WITH DIFFERENTIAL/PLATELET
BASOS PCT: 0 %
Basophils Absolute: 0 10*3/uL (ref 0–0.1)
Eosinophils Absolute: 0 10*3/uL (ref 0–0.7)
Eosinophils Relative: 0 %
HEMATOCRIT: 32.1 % — AB (ref 35.0–47.0)
HEMOGLOBIN: 10.2 g/dL — AB (ref 12.0–16.0)
Lymphocytes Relative: 13 %
Lymphs Abs: 0.7 10*3/uL — ABNORMAL LOW (ref 1.0–3.6)
MCH: 24.5 pg — ABNORMAL LOW (ref 26.0–34.0)
MCHC: 31.9 g/dL — AB (ref 32.0–36.0)
MCV: 77 fL — ABNORMAL LOW (ref 80.0–100.0)
MONOS PCT: 7 %
Monocytes Absolute: 0.4 10*3/uL (ref 0.2–0.9)
NEUTROS ABS: 4.6 10*3/uL (ref 1.4–6.5)
NEUTROS PCT: 80 %
Platelets: 144 10*3/uL — ABNORMAL LOW (ref 150–440)
RBC: 4.17 MIL/uL (ref 3.80–5.20)
RDW: 18.9 % — ABNORMAL HIGH (ref 11.5–14.5)
WBC: 5.6 10*3/uL (ref 3.6–11.0)

## 2016-07-13 LAB — TROPONIN I: Troponin I: <0.03 ng/mL (ref <0.03)

## 2016-07-13 LAB — LIPASE, BLOOD: Lipase: 35 U/L (ref 11–51)

## 2016-07-13 MED ORDER — FENTANYL CITRATE (PF) 100 MCG/2ML IJ SOLN
50.0000 ug | Freq: Once | INTRAMUSCULAR | Status: AC
  Administered 2016-07-13: 50 ug via INTRAVENOUS

## 2016-07-13 MED ORDER — FENTANYL CITRATE (PF) 100 MCG/2ML IJ SOLN
INTRAMUSCULAR | Status: AC
  Administered 2016-07-13: 50 ug via INTRAVENOUS
  Filled 2016-07-13: qty 2

## 2016-07-13 MED ORDER — METOCLOPRAMIDE HCL 5 MG/ML IJ SOLN
10.0000 mg | Freq: Once | INTRAMUSCULAR | Status: AC
  Administered 2016-07-13: 10 mg via INTRAVENOUS
  Filled 2016-07-13: qty 2

## 2016-07-13 MED ORDER — SODIUM CHLORIDE 0.9 % IV BOLUS (SEPSIS)
1000.0000 mL | Freq: Once | INTRAVENOUS | Status: AC
  Administered 2016-07-13: 1000 mL via INTRAVENOUS

## 2016-07-13 MED ORDER — IOPAMIDOL (ISOVUE-300) INJECTION 61%
30.0000 mL | Freq: Once | INTRAVENOUS | Status: AC | PRN
  Administered 2016-07-13: 30 mL via ORAL

## 2016-07-13 MED ORDER — FENTANYL CITRATE (PF) 100 MCG/2ML IJ SOLN
50.0000 ug | Freq: Once | INTRAMUSCULAR | Status: AC
  Administered 2016-07-13: 50 ug via INTRAVENOUS
  Filled 2016-07-13: qty 2

## 2016-07-13 MED ORDER — IOPAMIDOL (ISOVUE-300) INJECTION 61%
100.0000 mL | Freq: Once | INTRAVENOUS | Status: AC | PRN
  Administered 2016-07-13: 100 mL via INTRAVENOUS

## 2016-07-13 NOTE — Patient Outreach (Signed)
[facility]) Care Management [place] Telephone Outreach, Transition of Care day 1 07/13/2016  ZAMORIA BOSS [birth year] [id number]  Successful telephone outreach to Shelbie Proctor, husband and caregiver (on [place] written consent) of Nadiyah Zeis, 60 y/o female referred to [place] for transition of care after multiple recent hospitalizations.  Patient noted to have had 16 inpatient admissions in 2017, with 3 additional ED visits that resulted in patient discharge from the ED.  Patient's most recent hospitalization was October 30- 31, 2017 for chronic gastroparesis and abdominal pain.  Patient has history including, but not limited to HTN, HLD, uncontrolled DM type II, paroxysmal A-fib, C-difficile, diverticulitis, chronic pain, and gastroparesis.  HIPAA/ identity verified through Mr. Busker, as he reported that patient was "not available to talk on the phone right now."  Today, during our 50 minute phone conversation, Mr. Defino states that patient is "doing well and is in good spirits" since her most recent hospital discharge.  -- Medications:  Mr. Caffee reports that he "thinks" patient has all of her medications and is taking all as prescribed post-hospital discharge.  However, Mr. Purdie acknowledges that he "can't be sure," as patient administers and manages her own medications and he does not monitor for patient.  Due to patient handling her own medications, Mr. Winger is unable to perform formal medication reconciliation on patient behalf today.  -- Upcoming PCP appointment scheduled for July 18, 2016:  Mr. Westby states that family will transport patient to upcoming provider appointment, and he currently denies need for community resources for transportation assistance at this time.  Mr. Buser stated that he always tries to attend patient's provider appointments with her, and I encouraged Mr. Decarli to discuss with patient taking her medications to the upcoming  appointment for review with PCP.  -- Advanced Directives:  Mr. Sorrels "is not sure" if patient has a formal living will/ HCPOA, but states that he "does not think" she does.  -- Chronic Pain:  Mr. Savino stated that patient "is always in pain," due to ongoing health issues of various nature.  Mr. Piggott believes that the current medication regimen "is working pretty good for her."  -- Unreceptive to self-health management assistance: While Mr. Westra was very willing to speak with me today, he acknowledged that in the past, patient frequently consented to both home health services and [facility] CM services while at the hospital, only to refuse once at home.  Mr. Coor is not sure that patient is willing to speak with me by phone or to have an in-home visit.  He also acknowledges that he does not know all the aspects of her personal health behaviors around her medication adherence, diet adherence, etc.  Mr. Defino said that he would be happy to work with me in the ongoing care of patient, but he is not convinced that patient will agree.  We discussed [place] services, and I provided Mr. Hartis with my direct phone number, the main office number for [facility] CM, and the [facility] 24-hour nurse advice line.  I explained that I would be contacting patient/ caregiver again next Tuesday afternoon, July 17, 2016, and Mr. Granier agreed to discuss [facility] CM services with patient to determine her interest now that she is at home.  Mr. Ortman denies further questions, concerns, issues or problems today.  Plan:  Patient will take her medications as they are prescribed and will attend all scheduled provider appointments.  Caregiver will encourage patient  to take her medications to her upcoming PCP appointment with her for review with PCP.  Caregiver will discuss [place] services with patient and offer [facility] in- home visit on Thursday July 19, 2016.   Patient/ caregiver will contact her providers for any concerns,  needs, issues, or problems that arise.   [place] outreach for transition of care to continue with scheduled telephone outreach, and possible in-home visit, next week.  I will make patient's PCP aware of [facility] CM involvement in patient's care.  [name], RN, BSN, [employer] [facility] Care Management  [phone]

## 2016-07-13 NOTE — ED Triage Notes (Signed)
Patient brought in by ems form home. Patient with complaint of upper abdominal pain, nausea/vomiting, diarrhea and hematuria times 4 days. Patient states that she was seen here 2 days ago for the same and started on keflex but has had no improvement.

## 2016-07-13 NOTE — ED Provider Notes (Signed)
[facility] Emergency Department Provider Note  ____________________________________________   First MD Initiated Contact with Patient 07/13/16 2045     (approximate)  I have reviewed the triage vital signs and the nursing notes.   HISTORY  Chief Complaint Abdominal Pain; Emesis; Diarrhea; and Hematuria   HPI [name] is a 60 y.o. female with a history of diabetic gastroparesis who is presenting to the emergency department with diffuse abdominal pain with nausea vomiting and diarrhea. She denies any blood in her stool. Denies any blood in her vomitus. Says that she is having greater than 10 episodes of diarrhea per day. Says that she is also on Keflex because she also began to have hematuria. Denies any dysuria at this time. Says the pain is moderate to severe and is without any radiation.   Past Medical History:  Diagnosis Date  . Allergy   . Anxiety   . Cancer (HCC)    HX OF CANCER OF UTERUS   . Cirrhosis of liver not due to alcohol ([place]) 2016  . Degenerative disk disease   . Diverticulitis   . Gastroparesis   . GERD (gastroesophageal reflux disease)   . History of hiatal hernia   . Hypertension   . Hypothyroid   . Intussusception intestine ([place]) 05/2015  . PAF (paroxysmal atrial fibrillation) ([place]) 03/2015   a. new onset 03/2015 in setting of intractable N/V; b. on Eliquis 5 mg bid; c. CHADSVASc 4 (DM, TIA x 2, female)  . Pancreatitis   . Sick sinus syndrome ([place])   . Stomach ulcer   . Stroke [facility])    with minimal left sided weakness  . Syncope 01/2015  . TIA (transient ischemic attack) 02/2015  . Type 1 diabetes ([place])    on levemir    Patient Active Problem List   Diagnosis Date Noted  . Epigastric pain   . Abdominal pain, epigastric   . Personal history of surgery to heart and great vessels, presenting hazards to health   . Gastritis   . Foreign body in stomach   . Abnormal findings-gastrointestinal tract   . Diarrhea   .  Congestive dilated cardiomyopathy ([place])   . Generalized abdominal pain   . Nausea & vomiting   . NSTEMI (non-ST elevated myocardial infarction) ([place]) 01/11/2016  . Tachyarrhythmia 01/10/2016  . Intractable nausea and vomiting 01/10/2016  . Tachy-brady syndrome ([place]) 12/28/2015  . Abdominal pain 12/27/2015  . Colitis 12/16/2015  . Pneumonia 11/14/2015  . Narcotic withdrawal ([place]) 11/11/2015  . Diabetes mellitus type 2, insulin dependent ([place]) 11/11/2015  . Orthostatic hypotension   . H/O TIA (transient ischemic attack) and stroke   . Left-sided weakness 10/04/2015  . Expressive aphasia 10/04/2015  . Ileus ([place]) 08/01/2015  . Ascites   . Cryptogenic cirrhosis ([place]) 07/10/2015  . Paroxysmal atrial fibrillation ([place]) 07/10/2015  . C. difficile colitis 07/10/2015  . GI (gastrointestinal bleed) 07/06/2015  . Malnutrition of moderate degree 07/04/2015  . Symptomatic bradycardia 05/27/2015  . Intussusception intestine ([place])   . Hypothyroidism due to amiodarone   . Abdominal pain, chronic, epigastric   . Chronic anemia 05/19/2015  . Thrombocytopenia ([place]) 05/19/2015  . Prolonged QT interval   . Hypomagnesemia   . Narcotic abuse   . Uncontrolled type 2 diabetes mellitus ([place])   . Syncope due to orthostatic hypotension 05/18/2015  . Hypokalemia 04/06/2015  . Hyperlipidemia with target LDL less than 100 04/06/2015  . CVA (cerebral infarction) 02/15/2015  . Essential hypertension 01/12/2015  .  Chronically on opiate therapy 01/12/2015  . Gastroparesis 01/12/2015  . DEPRESSION/ANXIETY 06/27/2007  . MYOFASCIAL PAIN SYNDROME 06/27/2007  . Chronic pain syndrome 03/28/2007  . GERD 03/27/2007  . DIVERTICULOSIS, COLON 03/27/2007  . LUMBAR DISC DISPLACEMENT 03/27/2007  . PROTEINURIA 03/27/2007  . UTERINE CANCER, HX OF 03/27/2007    Past Surgical History:  Procedure Laterality Date  . ABDOMINAL HYSTERECTOMY    . CARDIAC CATHETERIZATION N/A 01/12/2016   Procedure: Left Heart Cath and  Coronary Angiography;  Surgeon: Wellington Hampshire, MD;  Location: [facility] CV [facility];  Service: Cardiovascular;  Laterality: N/A;  . CHOLECYSTECTOMY    . ESOPHAGOGASTRODUODENOSCOPY N/A 04/04/2015   Procedure: ESOPHAGOGASTRODUODENOSCOPY (EGD);  Surgeon: Hulen Luster, MD;  Location: [facility] ENDOSCOPY;  Service: Endoscopy;  Laterality: N/A;  . ESOPHAGOGASTRODUODENOSCOPY (EGD) WITH PROPOFOL N/A 01/18/2016   Procedure: ESOPHAGOGASTRODUODENOSCOPY (EGD) WITH PROPOFOL;  Surgeon: Lucilla Lame, MD;  Location: [facility] ENDOSCOPY;  Service: Endoscopy;  Laterality: N/A;  . FLEXIBLE SIGMOIDOSCOPY N/A 01/18/2016   Procedure: FLEXIBLE SIGMOIDOSCOPY;  Surgeon: Lucilla Lame, MD;  Location: [facility] ENDOSCOPY;  Service: Endoscopy;  Laterality: N/A;  . HERNIA REPAIR      Prior to Admission medications   Medication Sig Start Date End Date Taking? Authorizing Provider  acidophilus (RISAQUAD) CAPS capsule Take 2 capsules by mouth daily. 01/25/16  Yes Sital Mody, MD  albuterol (PROVENTIL HFA;VENTOLIN HFA) 108 (90 Base) MCG/ACT inhaler Inhale 2 puffs into the lungs every 6 (six) hours as needed for wheezing or shortness of breath. 05/15/16  Yes Epifanio Lesches, MD  atorvastatin (LIPITOR) 40 MG tablet Take 1 tablet (40 mg total) by mouth at bedtime. 05/15/16  Yes Epifanio Lesches, MD  bismuth subsalicylate (PEPTO BISMOL) 262 MG/15ML suspension Take 30 mLs by mouth every 4 (four) hours as needed for diarrhea or loose stools. 01/25/16  Yes Bettey Costa, MD  budesonide-formoterol (SYMBICORT) 160-4.5 MCG/ACT inhaler Inhale 2 puffs into the lungs 2 (two) times daily. 05/15/16  Yes Epifanio Lesches, MD  cephALEXin (KEFLEX) 500 MG capsule Take 1 capsule (500 mg total) by mouth every 12 (twelve) hours. 07/10/16  Yes Fritzi Mandes, MD  diphenhydrAMINE (BENADRYL) 25 mg capsule Take 2 capsules (50 mg total) by mouth every 6 (six) hours as needed. Patient taking differently: Take 50 mg by mouth every 6 (six) hours as needed for itching or allergies.   02/09/16  Yes Carrie Mew, MD  feeding supplement, GLUCERNA SHAKE, (GLUCERNA SHAKE) LIQD Take 237 mLs by mouth 3 (three) times daily between meals. 05/15/16  Yes Epifanio Lesches, MD  insulin aspart (NOVOLOG) 100 UNIT/ML injection Inject 5 Units into the skin 3 (three) times daily with meals as needed for high blood sugar. Pt uses as needed per sliding scale. 05/15/16  Yes Epifanio Lesches, MD  insulin detemir (LEVEMIR) 100 UNIT/ML injection Inject 0.05 mLs (5 Units total) into the skin at bedtime. Patient taking differently: Inject 17 Units into the skin at bedtime.  05/15/16  Yes Epifanio Lesches, MD  levothyroxine (SYNTHROID, LEVOTHROID) 75 MCG tablet Take 1 tablet (75 mcg total) by mouth daily before breakfast. 05/15/16  Yes Epifanio Lesches, MD  metoCLOPramide (REGLAN) 10 MG tablet Take 1 tablet (10 mg total) by mouth 4 (four) times daily -  before meals and at bedtime. 05/15/16  Yes Epifanio Lesches, MD  metoprolol tartrate (LOPRESSOR) 25 MG tablet Take 1 tablet (25 mg total) by mouth 2 (two) times daily. 07/10/16  Yes Fritzi Mandes, MD  morphine (MS CONTIN) 30 MG 12 hr tablet Take 1 tablet (30 mg  total) by mouth every 12 (twelve) hours. 07/10/16 08/09/16 Yes Fritzi Mandes, MD  ondansetron (ZOFRAN ODT) 4 MG disintegrating tablet Take 1 tablet (4 mg total) by mouth every 8 (eight) hours as needed for nausea or vomiting. 05/15/16  Yes Epifanio Lesches, MD  oxyCODONE (OXY IR/ROXICODONE) 5 MG immediate release tablet Take 2 tablets (10 mg total) by mouth every 4 (four) hours as needed for moderate pain. 06/20/16 07/20/16 Yes Lytle [place], MD  pantoprazole (PROTONIX) 40 MG tablet Take 1 tablet (40 mg total) by mouth 2 (two) times daily before a meal. 05/15/16  Yes Epifanio Lesches, MD  rifaximin (XIFAXAN) 550 MG TABS tablet Take 1 tablet (550 mg total) by mouth 2 (two) times daily. 05/15/16  Yes Epifanio Lesches, MD  spironolactone (ALDACTONE) 25 MG tablet Take 1 tablet (25 mg total) by mouth  daily. 05/15/16  Yes Epifanio Lesches, MD  tiZANidine (ZANAFLEX) 4 MG tablet Take 1 tablet (4 mg total) by mouth 3 (three) times daily as needed for muscle spasms. 05/15/16  Yes Epifanio Lesches, MD  apixaban (ELIQUIS) 5 MG TABS tablet Take 1 tablet (5 mg total) by mouth 2 (two) times daily. 05/15/16   Epifanio Lesches, MD    Allergies Hydrocodone; Aspirin; Erythromycin; Prednisone; Rosiglitazone maleate; Codeine sulfate; and Tetanus-diphtheria toxoids td  Family History  Problem Relation Age of Onset  . Hypertension Mother   . CAD Sister   . Heart attack Sister     Deceased 2014-12-03  . CAD Brother     Social History Social History  Substance Use Topics  . Smoking status: Never Smoker  . Smokeless tobacco: Never Used  . Alcohol use No    Review of Systems Constitutional: No fever/chills Eyes: No visual changes. ENT: No sore throat. Cardiovascular: Denies chest pain. Respiratory: Denies shortness of breath. Gastrointestinal: No abdominal pain.  No nausea, no vomiting.  No diarrhea.  No constipation. Genitourinary: Negative for dysuria. Musculoskeletal: Negative for back pain. Skin: Negative for rash. Neurological: Negative for headaches, focal weakness or numbness.  10-point ROS otherwise negative.  ____________________________________________   PHYSICAL EXAM:  VITAL SIGNS: ED Triage Vitals [07/13/16 2045]  Enc Vitals Group     BP (!) 196/109     Pulse Rate (!) 115     Resp 19     Temp 98.6 F (37 C)     Temp Source Oral     SpO2 98 %     Weight 135 lb (61.2 kg)     Height 5' 3"  (1.6 m)     Head Circumference      Peak Flow      Pain Score 10     Pain Loc      Pain Edu?      Excl. in [place]?     Constitutional: Alert and oriented. Well appearing and in no acute distress. Eyes: Conjunctivae are normal. PERRL. EOMI. Head: Atraumatic. Nose: No congestion/rhinnorhea. Mouth/Throat: Mucous membranes are moist.   Neck: No stridor.   Cardiovascular: Normal  rate, regular rhythm. Grossly normal heart sounds.  Good peripheral circulation. Respiratory: Normal respiratory effort.  No retractions. Lungs CTAB. Gastrointestinal: Soft Mild diffuse tenderness. No distention. Musculoskeletal: No lower extremity tenderness nor edema.  No joint effusions. Neurologic:  Normal speech and language. No gross focal neurologic deficits are appreciated.  Skin:  Skin is warm, dry and intact. No rash noted. Psychiatric: Mood and affect are normal. Speech and behavior are normal.  ____________________________________________   LABS (all labs ordered are listed, but  only abnormal results are displayed)  Labs Reviewed  CBC WITH DIFFERENTIAL/PLATELET - Abnormal; Notable for the following:       Result Value   Hemoglobin 10.2 (*)    HCT 32.1 (*)    MCV 77.0 (*)    MCH 24.5 (*)    MCHC 31.9 (*)    RDW 18.9 (*)    Platelets 144 (*)    Lymphs Abs 0.7 (*)    All other components within normal limits  COMPREHENSIVE METABOLIC PANEL - Abnormal; Notable for the following:    Potassium 3.1 (*)    CO2 21 (*)    Glucose, Bld 229 (*)    Total Bilirubin 1.3 (*)    All other components within normal limits  C DIFFICILE QUICK SCREEN W PCR REFLEX  GASTROINTESTINAL PANEL BY PCR, STOOL (REPLACES STOOL CULTURE)  LIPASE, BLOOD  TROPONIN I  URINALYSIS COMPLETEWITH MICROSCOPIC ([facility] ONLY)   ____________________________________________  EKG  ED ECG REPORT I, Doran Stabler, the attending physician, personally viewed and interpreted this ECG.   Date: 07/13/2016  EKG Time: 2044  Rate: 116  Rhythm: sinus tachycardia  Axis: Normal  Intervals:first-degree A-V block   ST&T Change: No ST segment elevation or depression. No abnormal T-wave inversion.  ____________________________________________  RADIOLOGY  Pending CT of the abdomen and pelvis. ____________________________________________   PROCEDURES  Procedure(s) performed:   Procedures  Critical  Care performed:   ____________________________________________   INITIAL IMPRESSION / ASSESSMENT AND PLAN / ED COURSE  Pertinent labs & imaging results that were available during my care of the patient were reviewed by me and considered in my medical decision making (see chart for details).  ----------------------------------------- 11 PM on 07/13/2016 -----------------------------------------  Patient required repeat dose of fentanyl. No vomiting or diarrhea in the emergency department. Pending CT of the abdomen and pelvis at this time. Reassuring labs so far. However, because of bounced back and shoulders appropriate to do imaging at this time. Possible acute on chronic symptoms from the patient's GERD and gastroparesis. Signed out to Dr. Quentin Cornwall.  Clinical Course     ____________________________________________   FINAL CLINICAL IMPRESSION(S) / ED DIAGNOSES  Abdominal pain. Nausea vomiting and diarrhea.    NEW MEDICATIONS STARTED DURING THIS VISIT:  New Prescriptions   No medications on file     Note:  This document was prepared using Dragon voice recognition software and may include unintentional dictation errors.    Orbie Pyo, MD 07/13/16 [phone]

## 2016-07-13 NOTE — ED Notes (Signed)
MD [facility] informed of pt's BP

## 2016-07-14 ENCOUNTER — Observation Stay: Payer: Commercial Managed Care - HMO | Admitting: Registered Nurse

## 2016-07-14 ENCOUNTER — Encounter: Payer: Self-pay | Admitting: *Deleted

## 2016-07-14 ENCOUNTER — Encounter
Admission: EM | Disposition: A | Discharge status: Home / Self Care | Payer: Self-pay | Source: Home / Self Care | Attending: Emergency Medicine

## 2016-07-14 DIAGNOSIS — N132 Hydronephrosis with renal and ureteral calculous obstruction: Secondary | ICD-10-CM | POA: Diagnosis not present

## 2016-07-14 DIAGNOSIS — N201 Calculus of ureter: Secondary | ICD-10-CM

## 2016-07-14 DIAGNOSIS — N200 Calculus of kidney: Secondary | ICD-10-CM | POA: Diagnosis not present

## 2016-07-14 DIAGNOSIS — R112 Nausea with vomiting, unspecified: Secondary | ICD-10-CM | POA: Diagnosis not present

## 2016-07-14 DIAGNOSIS — E876 Hypokalemia: Secondary | ICD-10-CM | POA: Diagnosis not present

## 2016-07-14 HISTORY — DX: Calculus of ureter: N20.1

## 2016-07-14 HISTORY — PX: CYSTOSCOPY/URETEROSCOPY/HOLMIUM LASER: SHX6545

## 2016-07-14 LAB — URINALYSIS COMPLETE WITH MICROSCOPIC (ARMC ONLY)
Bacteria, UA: NONE SEEN
Bilirubin Urine: NEGATIVE
Glucose, UA: 50 mg/dL — AB
KETONES UR: NEGATIVE mg/dL
LEUKOCYTES UA: NEGATIVE
Nitrite: NEGATIVE
PH: 7 (ref 5.0–8.0)
PROTEIN: NEGATIVE mg/dL
SPECIFIC GRAVITY, URINE: 1.024 (ref 1.005–1.030)
SQUAMOUS EPITHELIAL / LPF: NONE SEEN

## 2016-07-14 LAB — GLUCOSE, CAPILLARY
Glucose-Capillary: 137 mg/dL — ABNORMAL HIGH (ref 65–99)
Glucose-Capillary: 149 mg/dL — ABNORMAL HIGH (ref 65–99)
Glucose-Capillary: 157 mg/dL — ABNORMAL HIGH (ref 65–99)
Glucose-Capillary: 316 mg/dL — ABNORMAL HIGH (ref 65–99)

## 2016-07-14 SURGERY — CYSTOURETEROSCOPY, USING HOLMIUM LASER
Anesthesia: General | Laterality: Right

## 2016-07-14 MED ORDER — ACETAMINOPHEN 650 MG RE SUPP: 650.0000 mg | Freq: Four times a day (QID) | RECTAL | Status: DC | PRN

## 2016-07-14 MED ORDER — CEFTRIAXONE SODIUM-DEXTROSE 1-3.74 GM-% IV SOLR
1.0000 g | Freq: Once | INTRAVENOUS | Status: AC
  Administered 2016-07-14: 1 g via INTRAVENOUS
  Filled 2016-07-14: qty 50

## 2016-07-14 MED ORDER — METOCLOPRAMIDE HCL 10 MG PO TABS
10.0000 mg | ORAL_TABLET | Freq: Three times a day (TID) | ORAL | Status: DC
  Administered 2016-07-14 – 2016-07-17 (×13): 10 mg via ORAL
  Filled 2016-07-14 (×13): qty 1

## 2016-07-14 MED ORDER — ONDANSETRON 8 MG PO TBDP: 4.0000 mg | ORAL_TABLET | Freq: Three times a day (TID) | ORAL | Status: DC | PRN

## 2016-07-14 MED ORDER — ONDANSETRON HCL 4 MG/2ML IJ SOLN: 4.0000 mg | Freq: Once | INTRAMUSCULAR | Status: DC | PRN

## 2016-07-14 MED ORDER — LABETALOL HCL 5 MG/ML IV SOLN
10.0000 mg | INTRAVENOUS | Status: DC | PRN
Start: 2016-07-14 — End: 2016-07-17
  Filled 2016-07-14: qty 4

## 2016-07-14 MED ORDER — LEVOTHYROXINE SODIUM 75 MCG PO TABS
75.0000 ug | ORAL_TABLET | Freq: Every day | ORAL | Status: DC
  Administered 2016-07-14 – 2016-07-17 (×4): 75 ug via ORAL
  Filled 2016-07-14 (×4): qty 1

## 2016-07-14 MED ORDER — PANTOPRAZOLE SODIUM 40 MG PO TBEC
40.0000 mg | DELAYED_RELEASE_TABLET | Freq: Two times a day (BID) | ORAL | Status: DC
  Administered 2016-07-14 – 2016-07-17 (×6): 40 mg via ORAL
  Filled 2016-07-14 (×6): qty 1

## 2016-07-14 MED ORDER — DIPHENHYDRAMINE HCL 25 MG PO CAPS: 50.0000 mg | ORAL_CAPSULE | Freq: Four times a day (QID) | ORAL | Status: DC | PRN

## 2016-07-14 MED ORDER — MIDAZOLAM HCL 2 MG/2ML IJ SOLN
INTRAMUSCULAR | Status: DC | PRN
  Administered 2016-07-14: 2 mg via INTRAVENOUS

## 2016-07-14 MED ORDER — MORPHINE SULFATE ER 30 MG PO TBCR
30.0000 mg | EXTENDED_RELEASE_TABLET | Freq: Two times a day (BID) | ORAL | Status: DC
  Administered 2016-07-14 – 2016-07-17 (×7): 30 mg via ORAL
  Filled 2016-07-14 (×7): qty 1

## 2016-07-14 MED ORDER — ONDANSETRON HCL 4 MG PO TABS: 4.0000 mg | ORAL_TABLET | Freq: Four times a day (QID) | ORAL | Status: DC | PRN

## 2016-07-14 MED ORDER — ATORVASTATIN CALCIUM 20 MG PO TABS
40.0000 mg | ORAL_TABLET | Freq: Every day | ORAL | Status: DC
  Administered 2016-07-14 – 2016-07-16 (×3): 40 mg via ORAL
  Filled 2016-07-14 (×3): qty 2

## 2016-07-14 MED ORDER — BISMUTH SUBSALICYLATE 262 MG/15ML PO SUSP
30.0000 mL | ORAL | Status: DC | PRN
  Filled 2016-07-14: qty 118

## 2016-07-14 MED ORDER — PROPOFOL 10 MG/ML IV BOLUS
INTRAVENOUS | Status: DC | PRN
  Administered 2016-07-14: 100 mg via INTRAVENOUS

## 2016-07-14 MED ORDER — CEFTRIAXONE SODIUM 1 G IJ SOLR: 1.0000 g | Freq: Once | INTRAMUSCULAR | Status: DC

## 2016-07-14 MED ORDER — FENTANYL CITRATE (PF) 100 MCG/2ML IJ SOLN
INTRAMUSCULAR | Status: DC | PRN
  Administered 2016-07-14 (×2): 50 ug via INTRAVENOUS

## 2016-07-14 MED ORDER — TIZANIDINE HCL 4 MG PO TABS: 4.0000 mg | ORAL_TABLET | Freq: Three times a day (TID) | ORAL | Status: DC | PRN

## 2016-07-14 MED ORDER — SPIRONOLACTONE 25 MG PO TABS
25.0000 mg | ORAL_TABLET | Freq: Every day | ORAL | Status: DC
  Administered 2016-07-14: 25 mg via ORAL
  Filled 2016-07-14: qty 1

## 2016-07-14 MED ORDER — MOMETASONE FURO-FORMOTEROL FUM 200-5 MCG/ACT IN AERO
2.0000 | INHALATION_SPRAY | Freq: Two times a day (BID) | RESPIRATORY_TRACT | Status: DC
  Administered 2016-07-14 – 2016-07-17 (×7): 2 via RESPIRATORY_TRACT
  Filled 2016-07-14: qty 8.8

## 2016-07-14 MED ORDER — POLYETHYLENE GLYCOL 3350 17 G PO PACK: 17.0000 g | PACK | Freq: Every day | ORAL | Status: DC | PRN

## 2016-07-14 MED ORDER — INSULIN ASPART 100 UNIT/ML ~~LOC~~ SOLN
0.0000 [IU] | Freq: Three times a day (TID) | SUBCUTANEOUS | Status: DC
  Administered 2016-07-14 (×2): 2 [IU] via SUBCUTANEOUS
  Administered 2016-07-14: 18:00:00 3 [IU] via SUBCUTANEOUS
  Administered 2016-07-15: 08:00:00 5 [IU] via SUBCUTANEOUS
  Administered 2016-07-15: 12:00:00 3 [IU] via SUBCUTANEOUS
  Administered 2016-07-15: 2 [IU] via SUBCUTANEOUS
  Administered 2016-07-16: 5 [IU] via SUBCUTANEOUS
  Administered 2016-07-16 (×2): 3 [IU] via SUBCUTANEOUS
  Administered 2016-07-17: 13:00:00 8 [IU] via SUBCUTANEOUS
  Administered 2016-07-17: 09:00:00 2 [IU] via SUBCUTANEOUS
  Filled 2016-07-14: qty 2
  Filled 2016-07-14: qty 5
  Filled 2016-07-14 (×2): qty 3
  Filled 2016-07-14 (×2): qty 2
  Filled 2016-07-14: qty 3
  Filled 2016-07-14: qty 5
  Filled 2016-07-14: qty 8
  Filled 2016-07-14: qty 3
  Filled 2016-07-14: qty 2
  Filled 2016-07-14: qty 3

## 2016-07-14 MED ORDER — LIDOCAINE HCL (CARDIAC) 20 MG/ML IV SOLN
INTRAVENOUS | Status: DC | PRN
  Administered 2016-07-14: 80 mg via INTRAVENOUS

## 2016-07-14 MED ORDER — OXYCODONE HCL 5 MG PO TABS
10.0000 mg | ORAL_TABLET | ORAL | Status: DC | PRN
  Administered 2016-07-15 (×2): 10 mg via ORAL
  Filled 2016-07-14 (×2): qty 2

## 2016-07-14 MED ORDER — SODIUM CHLORIDE 0.9% FLUSH
3.0000 mL | Freq: Two times a day (BID) | INTRAVENOUS | Status: DC
  Administered 2016-07-14 – 2016-07-17 (×6): 3 mL via INTRAVENOUS

## 2016-07-14 MED ORDER — APIXABAN 5 MG PO TABS
5.0000 mg | ORAL_TABLET | Freq: Two times a day (BID) | ORAL | Status: DC
  Administered 2016-07-14 (×2): 5 mg via ORAL
  Filled 2016-07-14 (×2): qty 1

## 2016-07-14 MED ORDER — EPHEDRINE SULFATE 50 MG/ML IJ SOLN
INTRAMUSCULAR | Status: DC | PRN
  Administered 2016-07-14: 25 mg via INTRAVENOUS

## 2016-07-14 MED ORDER — ACETAMINOPHEN 325 MG PO TABS
650.0000 mg | ORAL_TABLET | Freq: Four times a day (QID) | ORAL | Status: DC | PRN
  Filled 2016-07-14: qty 2

## 2016-07-14 MED ORDER — FENTANYL CITRATE (PF) 100 MCG/2ML IJ SOLN
25.0000 ug | INTRAMUSCULAR | Status: DC | PRN
  Administered 2016-07-14: 25 ug via INTRAVENOUS

## 2016-07-14 MED ORDER — ONDANSETRON HCL 4 MG/2ML IJ SOLN
4.0000 mg | Freq: Four times a day (QID) | INTRAMUSCULAR | Status: DC | PRN
  Administered 2016-07-14 – 2016-07-16 (×6): 4 mg via INTRAVENOUS
  Filled 2016-07-14 (×6): qty 2

## 2016-07-14 MED ORDER — GLUCERNA SHAKE PO LIQD
237.0000 mL | Freq: Three times a day (TID) | ORAL | Status: DC
  Administered 2016-07-14 – 2016-07-17 (×7): 237 mL via ORAL

## 2016-07-14 MED ORDER — PHENYLEPHRINE HCL 10 MG/ML IJ SOLN
INTRAMUSCULAR | Status: DC | PRN
  Administered 2016-07-14: 100 ug via INTRAVENOUS
  Administered 2016-07-14: 400 ug via INTRAVENOUS
  Administered 2016-07-14: 200 ug via INTRAVENOUS

## 2016-07-14 MED ORDER — FENTANYL CITRATE (PF) 100 MCG/2ML IJ SOLN: 25.0000 ug | INTRAMUSCULAR | Status: DC | PRN

## 2016-07-14 MED ORDER — ROCURONIUM BROMIDE 100 MG/10ML IV SOLN
INTRAVENOUS | Status: DC | PRN
  Administered 2016-07-14: 30 mg via INTRAVENOUS
  Administered 2016-07-14: 20 mg via INTRAVENOUS

## 2016-07-14 MED ORDER — MORPHINE SULFATE (PF) 2 MG/ML IV SOLN
4.0000 mg | INTRAVENOUS | Status: DC | PRN
  Administered 2016-07-14 – 2016-07-17 (×16): 4 mg via INTRAVENOUS
  Filled 2016-07-14 (×15): qty 2

## 2016-07-14 MED ORDER — INSULIN DETEMIR 100 UNIT/ML ~~LOC~~ SOLN
5.0000 [IU] | Freq: Every day | SUBCUTANEOUS | Status: DC
  Administered 2016-07-14 – 2016-07-15 (×2): 5 [IU] via SUBCUTANEOUS
  Filled 2016-07-14 (×3): qty 0.05

## 2016-07-14 MED ORDER — PROCHLORPERAZINE EDISYLATE 5 MG/ML IJ SOLN
10.0000 mg | Freq: Four times a day (QID) | INTRAMUSCULAR | Status: DC | PRN
  Administered 2016-07-15: 13:00:00 10 mg via INTRAVENOUS
  Filled 2016-07-14: qty 2

## 2016-07-14 MED ORDER — ONDANSETRON HCL 4 MG/2ML IJ SOLN
INTRAMUSCULAR | Status: DC | PRN
Start: 2016-07-14 — End: 2016-07-14
  Administered 2016-07-14: 4 mg via INTRAVENOUS

## 2016-07-14 MED ORDER — SODIUM CHLORIDE 0.9 % IV SOLN
INTRAVENOUS | Status: DC | PRN
  Administered 2016-07-14: 15:00:00 via INTRAVENOUS

## 2016-07-14 MED ORDER — RISAQUAD PO CAPS
2.0000 | ORAL_CAPSULE | Freq: Every day | ORAL | Status: DC
  Administered 2016-07-14 – 2016-07-17 (×4): 2 via ORAL
  Filled 2016-07-14 (×4): qty 2

## 2016-07-14 MED ORDER — POTASSIUM CHLORIDE IN NACL 40-0.9 MEQ/L-% IV SOLN
INTRAVENOUS | Status: DC
  Administered 2016-07-14 (×2): 100 mL/h via INTRAVENOUS
  Filled 2016-07-14 (×4): qty 1000

## 2016-07-14 MED ORDER — ALBUTEROL SULFATE (2.5 MG/3ML) 0.083% IN NEBU: 3.0000 mL | INHALATION_SOLUTION | Freq: Four times a day (QID) | RESPIRATORY_TRACT | Status: DC | PRN

## 2016-07-14 MED ORDER — MORPHINE SULFATE (PF) 10 MG/ML IV SOLN
INTRAVENOUS | Status: AC
Start: 2016-07-14 — End: 2016-07-14
  Filled 2016-07-14: qty 1

## 2016-07-14 MED ORDER — DEXAMETHASONE SODIUM PHOSPHATE 10 MG/ML IJ SOLN
INTRAMUSCULAR | Status: DC | PRN
  Administered 2016-07-14: 5 mg via INTRAVENOUS

## 2016-07-14 MED ORDER — METOPROLOL TARTRATE 25 MG PO TABS
25.0000 mg | ORAL_TABLET | Freq: Two times a day (BID) | ORAL | Status: DC
  Administered 2016-07-14 – 2016-07-17 (×7): 25 mg via ORAL
  Filled 2016-07-14 (×7): qty 1

## 2016-07-14 MED ORDER — MORPHINE SULFATE (PF) 2 MG/ML IV SOLN
4.0000 mg | Freq: Once | INTRAVENOUS | Status: AC
  Administered 2016-07-14: 4 mg via INTRAVENOUS
  Filled 2016-07-14: qty 2

## 2016-07-14 MED ORDER — RIFAXIMIN 550 MG PO TABS
550.0000 mg | ORAL_TABLET | Freq: Two times a day (BID) | ORAL | Status: DC
  Administered 2016-07-14 – 2016-07-17 (×7): 550 mg via ORAL
  Filled 2016-07-14 (×8): qty 1

## 2016-07-14 MED ORDER — SUGAMMADEX SODIUM 200 MG/2ML IV SOLN
INTRAVENOUS | Status: DC | PRN
  Administered 2016-07-14: 200 mg via INTRAVENOUS

## 2016-07-14 MED ORDER — ONDANSETRON HCL 4 MG/2ML IJ SOLN
4.0000 mg | Freq: Once | INTRAMUSCULAR | Status: AC
  Administered 2016-07-14: 4 mg via INTRAVENOUS
  Filled 2016-07-14: qty 2

## 2016-07-14 MED ORDER — FENTANYL CITRATE (PF) 100 MCG/2ML IJ SOLN
INTRAMUSCULAR | Status: AC
  Filled 2016-07-14: qty 2

## 2016-07-14 MED ORDER — CEPHALEXIN 500 MG PO CAPS
500.0000 mg | ORAL_CAPSULE | Freq: Three times a day (TID) | ORAL | Status: DC
  Administered 2016-07-14 – 2016-07-16 (×6): 500 mg via ORAL
  Filled 2016-07-14 (×6): qty 1

## 2016-07-14 SURGICAL SUPPLY — 28 items
BASKET LASER NITINOL 1.9FR (BASKET) ×2 IMPLANT
BASKET NITINOL 4 WIRE 16 (BASKET) ×2 IMPLANT
BASKET NITINOL 4 WIRE 16MM (BASKET) ×1
BASKET ZERO TIP 1.9FR (BASKET) ×3 IMPLANT
BSKT STON RTRVL 120 1.9FR (BASKET) ×1
BSKT STON RTRVL 120X16 3FR (BASKET) ×1
BSKT STON RTRVL ZERO TP 1.9FR (BASKET) ×1
CATH FOL LX CONE TIP  8F (CATHETERS) ×2
CATH FOL LX CONE TIP 8F (CATHETERS) ×1 IMPLANT
CATH URETL 5X70 OPEN END (CATHETERS) ×3 IMPLANT
CONRAY 43 FOR UROLOGY 50M (MISCELLANEOUS) ×5 IMPLANT
FEE TECHNICIAN ONLY PER HOUR (MISCELLANEOUS) ×1 IMPLANT
FIBER LASER LITHO 273 (Laser) ×3 IMPLANT
GLOVE BIO SURGEON STRL SZ7.5 (GLOVE) ×7 IMPLANT
GOWN STRL REUS W/ TWL LRG LVL4 (GOWN DISPOSABLE) ×1 IMPLANT
GOWN STRL REUS W/ TWL XL LVL3 (GOWN DISPOSABLE) ×1 IMPLANT
GOWN STRL REUS W/TWL LRG LVL4 (GOWN DISPOSABLE) ×9
GOWN STRL REUS W/TWL XL LVL3 (GOWN DISPOSABLE) ×3
LASER HOLMIUM FIBER SU 272UM (MISCELLANEOUS) ×1 IMPLANT
PACK CYSTO AR (MISCELLANEOUS) ×3 IMPLANT
PREP PVP WINGED SPONGE (MISCELLANEOUS) ×3 IMPLANT
SENSORWIRE 0.038 NOT ANGLED (WIRE) ×3
SET CYSTO W/LG BORE CLAMP LF (SET/KITS/TRAYS/PACK) ×3 IMPLANT
SHEATH URETERAL 12FRX35CM (MISCELLANEOUS) ×2 IMPLANT
SOL PREP PVP 2OZ (MISCELLANEOUS) ×3
SOLUTION PREP PVP 2OZ (MISCELLANEOUS) ×1 IMPLANT
SYRINGE IRR TOOMEY STRL 70CC (SYRINGE) ×3 IMPLANT
WIRE SENSOR 0.038 NOT ANGLED (WIRE) ×1 IMPLANT

## 2016-07-14 NOTE — ED Notes (Signed)
Pt transported to room 109  

## 2016-07-14 NOTE — Brief Op Note (Signed)
07/13/2016 - 07/14/2016  3:51 PM  PATIENT:  [name]  60 y.o. female  PRE-OPERATIVE DIAGNOSIS:  right ureteral obstructing stone  POST-OPERATIVE DIAGNOSIS:  same  PROCEDURE:  Procedure(s): CYSTOSCOPY/URETEROSCOPY/HOLMIUM LASER (Right)  SURGEON:  Surgeon(s) and Role:    * Alexis Frock, MD - Primary  PHYSICIAN ASSISTANT:   ASSISTANTS: none   ANESTHESIA:   general  EBL:  Total I/O In: -  Out: 400 [Urine:400]  BLOOD ADMINISTERED:none  DRAINS: none   LOCAL MEDICATIONS USED:  NONE  SPECIMEN:  Source of Specimen:  Rt renal / ureteral stone fragments  DISPOSITION OF SPECIMEN:  PATHOLOGY  COUNTS:  YES  TOURNIQUET:  * No tourniquets in log *  DICTATION: .Other Dictation: Dictation Number [id number]  PLAN OF CARE: Admit to inpatient   PATIENT DISPOSITION:  PACU - hemodynamically stable.   Delay start of Pharmacological VTE agent (>24hrs) due to surgical blood loss or risk of bleeding: yes

## 2016-07-14 NOTE — Consult Note (Signed)
Reason for Consult: Right UPJ Stone with Refractory Nausea / Vomitting and Gross Hematuria  Referring Physician: Rosilyn Mings MD  [name] is an 60 y.o. female.   HPI:   1 - Right UPJ Stone with Refractory Nausea / Vomitting  - 49m Rt UPJ sotne with likely intermitant obstruction by CT on eval abdominal pain and nauseas with emesis. No additional stones. Stone is 871m SSD 8cm, 1150HU. UA without infectious parameters. No fevers, leukocytosis. Cr 0.5.  2 - Gross Hematuria - new gross hematuria 07/2016. CT Urogram with Rt UPJ stone as per above, no GU masses.   PMH sig for severe cirrhosis, ovarian cancer / hyst (now NED), lumbago / chronic narcotics. Her PCP is [name] "[name] seen in consultation for above.   Past Medical History:  Diagnosis Date  . Allergy   . Anxiety   . Cancer (HCC)    HX OF CANCER OF UTERUS   . Cirrhosis of liver not due to alcohol ([facility] [place]. Degenerative disk disease   . Diverticulitis   . Gastroparesis   . GERD (gastroesophageal reflux disease)   . History of hiatal hernia   . Hypertension   . Hypothyroid   . Int[place]usception intestine [place]  . PAF (paroxysmal atrial fibrillation) ([facility] [place]   a. new onset 03/2015 in setting of intractable N/V; b. on Eliquis 5 mg bid; c. CHADSVASc 4 (DM, TIA x 2, female)  . Pancreatitis   . Sick sinus syndrome ([facility]  . Stomach ulcer   . [facility]   with minimal left sided weakness  . Syncope 01/2015  . TIA (transient ischemic attack) 02/2015  . Type 1 diabetes ([facility]   on levemir    Past Surgical History:  Procedure Laterality Date  . ABDOMINAL HYSTERECTOMY    . CARDIAC CATHETERIZATION N/A 01/12/2016   Procedure: Left Heart Cath and Coronary Angiography;  Surgeon: MuWellington HampshireMD;  [facility];  Service: Cardiovascular;  Laterality: N/A;  . CHOLECYSTECTOMY    . ESOPHAGOGASTRODUODENOSCOPY N/A 04/04/2015   Procedure:  ESOPHAGOGASTRODUODENOSCOPY (EGD);  Surgeon: PaHulen LusterMD;  [facility];  Service: Endoscopy;  Laterality: N/A;  . ESOPHAGOGASTRODUODENOSCOPY (EGD) WITH PROPOFOL N/A 01/18/2016   Procedure: ESOPHAGOGASTRODUODENOSCOPY (EGD) WITH PROPOFOL;  Surgeon: DaLucilla LameMD;  Location: [facility] ENDOSCOPY;  Service: Endoscopy;  Laterality: N/A;  . FLEXIBLE SIGMOIDOSCOPY N/A 01/18/2016   Procedure: FLEXIBLE SIGMOIDOSCOPY;  Surgeon: DaLucilla LameMD;  Location: [facility] ENDOSCOPY;  Service: Endoscopy;  Laterality: N/A;  . HERNIA REPAIR      Family History  Problem Relation Age of Onset  . Hypertension Mother   . CAD Sister   . Heart attack Sister     Deceased 2/Oct 22, 2014. CAD Brother     Social History:  reports that she has never smoked. She has never used smokeless tobacco. She reports that she does not drink alcohol or use drugs.  Allergies:  Allergies  Allergen Reactions  . Hydrocodone Other (See Comments)    Pt states that this medication caused cirrhosis of the liver.    . Aspirin   . Erythromycin Other (See Comments)    Reaction:  Fever   . Prednisone Other (See Comments)    Reaction:  Unknown   . Rosiglitazone Maleate Swelling  . Codeine Sulfate Rash  . Tetanus-Diphtheria Toxoids Td Rash and Other (See Comments)    Reaction:  Fever  Medications: I have reviewed the patient's current medications.  Results for orders placed or performed during the hospital encounter of 07/13/16 (from the past 48 hour(s))  CBC with Differential     Status: Abnormal   Collection Time: 07/13/16  9:10 PM  Result Value Ref Range   WBC 5.6 3.6 - 11.0 K/uL   RBC 4.17 3.80 - 5.20 MIL/uL   Hemoglobin 10.2 (L) 12.0 - 16.0 g/dL   HCT 32.1 (L) 35.0 - 47.0 %   MCV 77.0 (L) 80.0 - 100.0 fL   MCH 24.5 (L) 26.0 - 34.0 pg   MCHC 31.9 (L) 32.0 - 36.0 g/dL   RDW 18.9 (H) 11.5 - 14.5 %   Platelets 144 (L) 150 - 440 K/uL   Neutrophils Relative % 80 %   Neutro Abs 4.6 1.4 - 6.5 K/uL   Lymphocytes Relative 13  %   Lymphs Abs 0.7 (L) 1.0 - 3.6 K/uL   Monocytes Relative 7 %   Monocytes Absolute 0.4 0.2 - 0.9 K/uL   Eosinophils Relative 0 %   Eosinophils Absolute 0.0 0 - 0.7 K/uL   Basophils Relative 0 %   Basophils Absolute 0.0 0 - 0.1 K/uL  Comprehensive metabolic panel     Status: Abnormal   Collection Time: 07/13/16  9:10 PM  Result Value Ref Range   Sodium 135 135 - 145 mmol/L   Potassium 3.1 (L) 3.5 - 5.1 mmol/L   Chloride 104 101 - 111 mmol/L   CO2 21 (L) 22 - 32 mmol/L   Glucose, Bld 229 (H) 65 - 99 mg/dL   BUN 12 6 - 20 mg/dL   Creatinine, Ser 0.51 0.44 - 1.00 mg/dL   Calcium 9.2 8.9 - 10.3 mg/dL   Total Protein 6.8 6.5 - 8.1 g/dL   Albumin 3.8 3.5 - 5.0 g/dL   AST 20 15 - 41 U/L   ALT 24 14 - 54 U/L   Alkaline Phosphatase 102 38 - 126 U/L   Total Bilirubin 1.3 (H) 0.3 - 1.2 mg/dL   GFR calc non Af Amer >60 >60 mL/min   GFR calc Af Amer >60 >60 mL/min    Comment: (NOTE) The eGFR has been calculated using the CKD EPI equation. This calculation has not been validated in all clinical situations. eGFR's persistently <60 mL/min signify possible Chronic Kidney Disease.    Anion gap 10 5 - 15  Lipase, blood     Status: None   Collection Time: 07/13/16  9:10 PM  Result Value Ref Range   Lipase 35 11 - 51 U/L  Troponin I     Status: None   Collection Time: 07/13/16  9:10 PM  Result Value Ref Range   Troponin I <0.03 <0.03 ng/mL  Urinalysis complete, with microscopic ([facility] only)     Status: Abnormal   Collection Time: 07/14/16 12:15 AM  Result Value Ref Range   Color, Urine STRAW (A) YELLOW   APPearance CLEAR (A) CLEAR   Glucose, UA 50 (A) NEGATIVE mg/dL   Bilirubin Urine NEGATIVE NEGATIVE   Ketones, ur NEGATIVE NEGATIVE mg/dL   Specific Gravity, Urine 1.024 1.005 - 1.030   Hgb urine dipstick 3+ (A) NEGATIVE   pH 7.0 5.0 - 8.0   Protein, ur NEGATIVE NEGATIVE mg/dL   Nitrite NEGATIVE NEGATIVE   Leukocytes, UA NEGATIVE NEGATIVE   RBC / HPF TOO NUMEROUS TO COUNT 0 - 5  RBC/hpf   WBC, UA 6-30 0 - 5 WBC/hpf   Bacteria, UA NONE  SEEN NONE SEEN   Squamous Epithelial / LPF NONE SEEN NONE SEEN  Glucose, capillary     Status: Abnormal   Collection Time: 07/14/16  7:59 AM  Result Value Ref Range   Glucose-Capillary 137 (H) 65 - 99 mg/dL   Comment 1 Notify RN    Comment 2 Document in Chart     Ct Abdomen Pelvis W Contrast  Result Date: 07/13/2016 CLINICAL DATA:  Upper abdominal pain, nausea and vomiting. Hematuria. Four days duration. No improvement with antibiotic started 2 days ago. EXAM: CT ABDOMEN AND PELVIS WITH CONTRAST TECHNIQUE: Multidetector CT imaging of the abdomen and pelvis was performed using the standard protocol following bolus administration of intravenous contrast. CONTRAST:  117m ISOVUE-300 IOPAMIDOL (ISOVUE-300) INJECTION 61% COMPARISON:  03/13/2016 FINDINGS: Lower chest: No acute abnormality. Hepatobiliary: Cirrhotic liver without focal lesion. Portal vein is patent. Evidence of portal hypertension, with prominent varices of the distal esophagus and upper abdomen. Prior cholecystectomy. Normal bile ducts. Pancreas: Unremarkable. No pancreatic ductal dilatation or surrounding inflammatory changes. Spleen: Normal in size without focal abnormality. Adrenals/Urinary Tract: Obstructing right ureteropelvic junction calculus measuring 5 x 8 x 10 mm, previously located in the lower pole collecting system on 03/13/16. Mural enhancement of the right ureter and right collecting system, nonspecific but worrisome for upper tract infection. At least 2 collecting system calculi of the left midpole on left lower pole, 2-3 mm each. No significant renal parenchymal lesions. Urinary bladder is unremarkable. Stomach/Bowel: Distal esophageal varices. Stomach, small bowel and colon are unremarkable. Vascular/Lymphatic: The abdominal aorta is normal in caliber with mild atherosclerotic calcification. No adenopathy is evident in the abdomen or pelvis. Reproductive: Hysterectomy.   No adnexal abnormalities. Other: Small volume ascites, decreased from 03/13/2016 Musculoskeletal: No acute or significant osseous findings. IMPRESSION: 1. Obstructing right UPJ calculus measuring 5 x 8 x 10 mm. Nonspecific mural enhancement of the right ureter right collecting system, sometimes seen with upper tract infection. 2. Left nephrolithiasis. 3. Cirrhosis with small volume ascites. Upper abdominal and distal esophageal varices. Electronically Signed   By: DAndreas NewportM.D.   On: 07/13/2016 23:59    Review of Systems  Constitutional: Positive for malaise/fatigue. Negative for fever.  HENT: Negative.   Eyes: Negative.   Respiratory: Negative.   Cardiovascular: Negative.   Gastrointestinal: Positive for abdominal pain, nausea and vomiting.  Genitourinary: Positive for hematuria.  Musculoskeletal: Negative.   Skin: Negative.   Neurological: Negative.   Psychiatric/Behavioral: Negative.    Blood pressure (!) 183/95, pulse (!) 113, temperature 98.2 F (36.8 C), temperature source Oral, resp. rate 20, height _0  (1.6 m), weight 55.9 kg (123 lb 4.8 oz), SpO2 100 %. Physical Exam  Constitutional: She is oriented to person, place, and time. She appears well-developed.  HENT:  Head: Normocephalic.  Eyes: Pupils are equal, round, and reactive to light.  Neck: Normal range of motion.  Cardiovascular: Normal rate.   Respiratory: Effort normal.  GI: Soft.  Genitourinary:  Genitourinary Comments: Mild Rt CVAT  Musculoskeletal: Normal range of motion.  Neurological: She is alert and oriented to person, place, and time.  Skin: Skin is warm.  Psychiatric: She has a normal mood and affect. Her behavior is normal. Judgment and thought content normal.    Assessment/Plan:  1 - Right UPJ Stone with Refractory Nausea / Vomitting  - Rt stone certainly may be source of or exacerbating nauseas / abd pain. This stone is much to large to pass. Optimal management would be ureteroscopy,  possibly staged given  stone size. ESWL not favored given anticoagulation and platelent dysfunction.   She desires ureteroscopy, will arrange. Risks, benefits, alternatives, expcected peri-op course, need for temporary ureteral stents and possible staged approach specifically mentioned.   Rocephin 1gm now as peri-op proph.  2 - Gross Hematuria - likely from stone above in setting or relative platelent dysfunction / cirrhosis. Cysto at above to complete eval and r/o neoplasia.     [name] 07/14/2016, 8:28 AM

## 2016-07-14 NOTE — H&P (Signed)
[name] is an 60 y.o. female.   Chief Complaint: Blood in urine HPI: The patient well-known to the hospitalist service with past medical history of nonalcoholic cirrhosis of the liver as well as gastroparesis presents to the emergency department complaining of blood in her urine as well as nausea and vomiting. The patient states that she has been vomiting more than usual since yesterday. She complains of left lower abdominal and flank pain with concomitant blood in her urine. CT of her abdomen revealed an obstructing kidney stone 10 mm in diameter along with nonobstructing renal calculi. Due to pain and intractable nausea and vomiting the emergency department staff called the hospitalist service for admission.  Past Medical History:  Diagnosis Date  . Allergy   . Anxiety   . Cancer (HCC)    HX OF CANCER OF UTERUS   . Cirrhosis of liver not due to alcohol ([place]) 11-14-14  . Degenerative disk disease   . Diverticulitis   . Gastroparesis   . GERD (gastroesophageal reflux disease)   . History of hiatal hernia   . Hypertension   . Hypothyroid   . Intussusception intestine ([place]) 05/2015  . PAF (paroxysmal atrial fibrillation) ([place]) 03/2015   a. new onset 03/2015 in setting of intractable N/V; b. on Eliquis 5 mg bid; c. CHADSVASc 4 (DM, TIA x 2, female)  . Pancreatitis   . Sick sinus syndrome ([place])   . Stomach ulcer   . Stroke [facility])    with minimal left sided weakness  . Syncope 01/2015  . TIA (transient ischemic attack) 02/2015  . Type 1 diabetes ([place])    on levemir    Past Surgical History:  Procedure Laterality Date  . ABDOMINAL HYSTERECTOMY    . CARDIAC CATHETERIZATION N/A 01/12/2016   Procedure: Left Heart Cath and Coronary Angiography;  Surgeon: Wellington Hampshire, MD;  Location: [place] [facility];  Service: Cardiovascular;  Laterality: N/A;  . CHOLECYSTECTOMY    . ESOPHAGOGASTRODUODENOSCOPY N/A 04/04/2015   Procedure: ESOPHAGOGASTRODUODENOSCOPY (EGD);  Surgeon: Hulen Luster, MD;   Location: [facility] ENDOSCOPY;  Service: Endoscopy;  Laterality: N/A;  . ESOPHAGOGASTRODUODENOSCOPY (EGD) WITH PROPOFOL N/A 01/18/2016   Procedure: ESOPHAGOGASTRODUODENOSCOPY (EGD) WITH PROPOFOL;  Surgeon: Lucilla Lame, MD;  Location: [facility] ENDOSCOPY;  Service: Endoscopy;  Laterality: N/A;  . FLEXIBLE SIGMOIDOSCOPY N/A 01/18/2016   Procedure: FLEXIBLE SIGMOIDOSCOPY;  Surgeon: Lucilla Lame, MD;  Location: [facility] ENDOSCOPY;  Service: Endoscopy;  Laterality: N/A;  . HERNIA REPAIR      Family History  Problem Relation Age of Onset  . Hypertension Mother   . CAD Sister   . Heart attack Sister     Deceased 11-14-2014  . CAD Brother    Social History:  reports that she has never smoked. She has never used smokeless tobacco. She reports that she does not drink alcohol or use drugs.  Allergies:  Allergies  Allergen Reactions  . Hydrocodone Other (See Comments)    Pt states that this medication caused cirrhosis of the liver.    . Aspirin   . Erythromycin Other (See Comments)    Reaction:  Fever   . Prednisone Other (See Comments)    Reaction:  Unknown   . Rosiglitazone Maleate Swelling  . Codeine Sulfate Rash  . Tetanus-Diphtheria Toxoids Td Rash and Other (See Comments)    Reaction:  Fever     Medications Prior to Admission  Medication Sig Dispense Refill  . acidophilus (RISAQUAD) CAPS capsule Take 2 capsules by mouth daily. [place]  capsule 0  . albuterol (PROVENTIL HFA;VENTOLIN HFA) 108 (90 Base) MCG/ACT inhaler Inhale 2 puffs into the lungs every 6 (six) hours as needed for wheezing or shortness of breath. 1 Inhaler 2  . atorvastatin (LIPITOR) 40 MG tablet Take 1 tablet (40 mg total) by mouth at bedtime. 30 tablet 0  . bismuth subsalicylate (PEPTO BISMOL) 262 MG/15ML suspension Take 30 mLs by mouth every 4 (four) hours as needed for diarrhea or loose stools. 360 mL 0  . budesonide-formoterol (SYMBICORT) 160-4.5 MCG/ACT inhaler Inhale 2 puffs into the lungs 2 (two) times daily. 1 Inhaler 1  . cephALEXin  (KEFLEX) 500 MG capsule Take 1 capsule (500 mg total) by mouth every 12 (twelve) hours. 10 capsule 0  . diphenhydrAMINE (BENADRYL) 25 mg capsule Take 2 capsules (50 mg total) by mouth every 6 (six) hours as needed. (Patient taking differently: Take 50 mg by mouth every 6 (six) hours as needed for itching or allergies. ) 60 capsule 0  . feeding supplement, GLUCERNA SHAKE, (GLUCERNA SHAKE) LIQD Take 237 mLs by mouth 3 (three) times daily between meals. 60 Can 0  . insulin aspart (NOVOLOG) 100 UNIT/ML injection Inject 5 Units into the skin 3 (three) times daily with meals as needed for high blood sugar. Pt uses as needed per sliding scale. 10 mL 11  . insulin detemir (LEVEMIR) 100 UNIT/ML injection Inject 0.05 mLs (5 Units total) into the skin at bedtime. (Patient taking differently: Inject 17 Units into the skin at bedtime. ) 10 mL 11  . levothyroxine (SYNTHROID, LEVOTHROID) 75 MCG tablet Take 1 tablet (75 mcg total) by mouth daily before breakfast. 30 tablet 1  . metoCLOPramide (REGLAN) 10 MG tablet Take 1 tablet (10 mg total) by mouth 4 (four) times daily -  before meals and at bedtime. 60 tablet 0  . metoprolol tartrate (LOPRESSOR) 25 MG tablet Take 1 tablet (25 mg total) by mouth 2 (two) times daily. 60 tablet 0  . morphine (MS CONTIN) 30 MG 12 hr tablet Take 1 tablet (30 mg total) by mouth every 12 (twelve) hours. 10 tablet 0  . ondansetron (ZOFRAN ODT) 4 MG disintegrating tablet Take 1 tablet (4 mg total) by mouth every 8 (eight) hours as needed for nausea or vomiting. 20 tablet 0  . oxyCODONE (OXY IR/ROXICODONE) 5 MG immediate release tablet Take 2 tablets (10 mg total) by mouth every 4 (four) hours as needed for moderate pain. 45 tablet 0  . pantoprazole (PROTONIX) 40 MG tablet Take 1 tablet (40 mg total) by mouth 2 (two) times daily before a meal. 30 tablet 0  . rifaximin (XIFAXAN) 550 MG TABS tablet Take 1 tablet (550 mg total) by mouth 2 (two) times daily. 60 tablet 1  . spironolactone  (ALDACTONE) 25 MG tablet Take 1 tablet (25 mg total) by mouth daily. 30 tablet 0  . tiZANidine (ZANAFLEX) 4 MG tablet Take 1 tablet (4 mg total) by mouth 3 (three) times daily as needed for muscle spasms. 12 tablet 0  . apixaban (ELIQUIS) 5 MG TABS tablet Take 1 tablet (5 mg total) by mouth 2 (two) times daily. 60 tablet 0    Results for orders placed or performed during the hospital encounter of 07/13/16 (from the past 48 hour(s))  CBC with Differential     Status: Abnormal   Collection Time: 07/13/16  9:10 PM  Result Value Ref Range   WBC 5.6 3.6 - 11.0 K/uL   RBC 4.17 3.80 - 5.20 MIL/uL   Hemoglobin  10.2 (L) 12.0 - 16.0 g/dL   HCT 32.1 (L) 35.0 - 47.0 %   MCV 77.0 (L) 80.0 - 100.0 fL   MCH 24.5 (L) 26.0 - 34.0 pg   MCHC 31.9 (L) 32.0 - 36.0 g/dL   RDW 18.9 (H) 11.5 - 14.5 %   Platelets 144 (L) 150 - 440 K/uL   Neutrophils Relative % 80 %   Neutro Abs 4.6 1.4 - 6.5 K/uL   Lymphocytes Relative 13 %   Lymphs Abs 0.7 (L) 1.0 - 3.6 K/uL   Monocytes Relative 7 %   Monocytes Absolute 0.4 0.2 - 0.9 K/uL   Eosinophils Relative 0 %   Eosinophils Absolute 0.0 0 - 0.7 K/uL   Basophils Relative 0 %   Basophils Absolute 0.0 0 - 0.1 K/uL  Comprehensive metabolic panel     Status: Abnormal   Collection Time: 07/13/16  9:10 PM  Result Value Ref Range   Sodium 135 135 - 145 mmol/L   Potassium 3.1 (L) 3.5 - 5.1 mmol/L   Chloride 104 101 - 111 mmol/L   CO2 21 (L) 22 - 32 mmol/L   Glucose, Bld 229 (H) 65 - 99 mg/dL   BUN 12 6 - 20 mg/dL   Creatinine, Ser 0.51 0.44 - 1.00 mg/dL   Calcium 9.2 8.9 - 10.3 mg/dL   Total Protein 6.8 6.5 - 8.1 g/dL   Albumin 3.8 3.5 - 5.0 g/dL   AST 20 15 - 41 U/L   ALT 24 14 - 54 U/L   Alkaline Phosphatase 102 38 - 126 U/L   Total Bilirubin 1.3 (H) 0.3 - 1.2 mg/dL   GFR calc non Af Amer >60 >60 mL/min   GFR calc Af Amer >60 >60 mL/min    Comment: (NOTE) The eGFR has been calculated using the CKD EPI equation. This calculation has not been validated in all  clinical situations. eGFR's persistently <60 mL/min signify possible Chronic Kidney Disease.    Anion gap 10 5 - 15  Lipase, blood     Status: None   Collection Time: 07/13/16  9:10 PM  Result Value Ref Range   Lipase 35 11 - 51 U/L  Troponin I     Status: None   Collection Time: 07/13/16  9:10 PM  Result Value Ref Range   Troponin I <0.03 <0.03 ng/mL  Urinalysis complete, with microscopic ([facility] only)     Status: Abnormal   Collection Time: 07/14/16 12:15 AM  Result Value Ref Range   Color, Urine STRAW (A) YELLOW   APPearance CLEAR (A) CLEAR   Glucose, UA 50 (A) NEGATIVE mg/dL   Bilirubin Urine NEGATIVE NEGATIVE   Ketones, ur NEGATIVE NEGATIVE mg/dL   Specific Gravity, Urine 1.024 1.005 - 1.030   Hgb urine dipstick 3+ (A) NEGATIVE   pH 7.0 5.0 - 8.0   Protein, ur NEGATIVE NEGATIVE mg/dL   Nitrite NEGATIVE NEGATIVE   Leukocytes, UA NEGATIVE NEGATIVE   RBC / HPF TOO NUMEROUS TO COUNT 0 - 5 RBC/hpf   WBC, UA 6-30 0 - 5 WBC/hpf   Bacteria, UA NONE SEEN NONE SEEN   Squamous Epithelial / LPF NONE SEEN NONE SEEN   Ct Abdomen Pelvis W Contrast  Result Date: 07/13/2016 CLINICAL DATA:  Upper abdominal pain, nausea and vomiting. Hematuria. Four days duration. No improvement with antibiotic started 2 days ago. EXAM: CT ABDOMEN AND PELVIS WITH CONTRAST TECHNIQUE: Multidetector CT imaging of the abdomen and pelvis was performed using the standard protocol following bolus  administration of intravenous contrast. CONTRAST:  148m ISOVUE-300 IOPAMIDOL (ISOVUE-300) INJECTION 61% COMPARISON:  03/13/2016 FINDINGS: Lower chest: No acute abnormality. Hepatobiliary: Cirrhotic liver without focal lesion. Portal vein is patent. Evidence of portal hypertension, with prominent varices of the distal esophagus and upper abdomen. Prior cholecystectomy. Normal bile ducts. Pancreas: Unremarkable. No pancreatic ductal dilatation or surrounding inflammatory changes. Spleen: Normal in size without focal abnormality.  Adrenals/Urinary Tract: Obstructing right ureteropelvic junction calculus measuring 5 x 8 x 10 mm, previously located in the lower pole collecting system on 03/13/16. Mural enhancement of the right ureter and right collecting system, nonspecific but worrisome for upper tract infection. At least 2 collecting system calculi of the left midpole on left lower pole, 2-3 mm each. No significant renal parenchymal lesions. Urinary bladder is unremarkable. Stomach/Bowel: Distal esophageal varices. Stomach, small bowel and colon are unremarkable. Vascular/Lymphatic: The abdominal aorta is normal in caliber with mild atherosclerotic calcification. No adenopathy is evident in the abdomen or pelvis. Reproductive: Hysterectomy.  No adnexal abnormalities. Other: Small volume ascites, decreased from 03/13/2016 Musculoskeletal: No acute or significant osseous findings. IMPRESSION: 1. Obstructing right UPJ calculus measuring 5 x 8 x 10 mm. Nonspecific mural enhancement of the right ureter right collecting system, sometimes seen with upper tract infection. 2. Left nephrolithiasis. 3. Cirrhosis with small volume ascites. Upper abdominal and distal esophageal varices. Electronically Signed   By: DAndreas NewportM.D.   On: 07/13/2016 23:59    Review of Systems  Constitutional: Negative for chills and fever.  HENT: Negative for sore throat and tinnitus.   Eyes: Negative for blurred vision and redness.  Respiratory: Negative for cough and shortness of breath.   Cardiovascular: Negative for chest pain, palpitations, orthopnea and PND.  Gastrointestinal: Positive for nausea and vomiting. Negative for abdominal pain and diarrhea.  Genitourinary: Positive for hematuria. Negative for dysuria, frequency and urgency.  Musculoskeletal: Negative for joint pain and myalgias.  Skin: Negative for rash.       No lesions  Neurological: Negative for speech change, focal weakness and weakness.  Endo/Heme/Allergies: Does not bruise/bleed  easily.       No temperature intolerance  Psychiatric/Behavioral: Negative for depression and suicidal ideas.    Blood pressure (!) 183/95, pulse (!) 113, temperature 98.2 F (36.8 C), temperature source Oral, resp. rate 20, height 5' 3"  (1.6 m), weight 55.9 kg (123 lb 4.8 oz), SpO2 100 %. Physical Exam  Vitals reviewed. Constitutional: She is oriented to person, place, and time. She appears well-developed and well-nourished.  HENT:  Head: Normocephalic and atraumatic.  Mouth/Throat: Oropharynx is clear and moist.  Eyes: Conjunctivae and EOM are normal. Pupils are equal, round, and reactive to light. No scleral icterus.  Neck: Normal range of motion. Neck supple. No JVD present. No tracheal deviation present. No thyromegaly present.  Cardiovascular: Normal rate, regular rhythm and normal heart sounds.  Exam reveals no gallop and no friction rub.   No murmur heard. Respiratory: Effort normal and breath sounds normal.  GI: Soft. Bowel sounds are normal. She exhibits no distension. There is no tenderness.  Genitourinary:  Genitourinary Comments: Deferred  Musculoskeletal: Normal range of motion. She exhibits no edema.  Lymphadenopathy:    She has no cervical adenopathy.  Neurological: She is alert and oriented to person, place, and time. No cranial nerve deficit. She exhibits normal muscle tone.  Skin: Skin is warm and dry. No rash noted. No erythema.  Psychiatric: She has a normal mood and affect. Her behavior is normal. Judgment and thought  content normal.     Assessment/Plan This is a 60 year old female admitted for intractable nausea and vomiting. 1. Nausea and vomiting: Zofran as needed; Compazine for refractory symptoms. Hydrate with intravenous saline. Continue metoclopramide for gastroparesis. The patient also has esophageal varices but has had no hematemesis. Replete potassium. 2. Kidney stone: Goals pain management for now. Urology has been consulted. The patient is on chronic  UTI prophylaxis. It is unclear at this time if she has a current infection but I have increased her Keflex to 3 times a day prior to urology intervention in case she is developing an infection. 3. Cirrhosis: Minimal ascites; continue rifaximin, Lasix and spironolactone. SBP unlikely at this time. 4. Diabetes mellitus: Continue basal insulin as well as sliding scale. 5. Atrial fibrillation: Paroxysmal; metoprolol for rate control. Continue Eliquis 6. Hypothyroidism: Continue Synthroid. Check TSH. 7. Hyperlipidemia: Continue statin therapy 8. Asthma: Controlled; Continue inhaled corticosteroid. Albuterol as needed 9. DVT prophylaxis: Full dose anticoagulation as above 10. GI prophylaxis: Pantoprazole The patient is a full code. Time spent on admission orders and patient care approximately 45 minutes  Harrie Foreman, MD 07/14/2016, 6:34 AM

## 2016-07-14 NOTE — Transfer of Care (Signed)
Immediate Anesthesia Transfer of Care Note  Patient: [name]  Procedure(s) Performed: Procedure(s): CYSTOSCOPY/URETEROSCOPY/HOLMIUM LASER (Right)  Patient Location: PACU  Anesthesia Type:General  Level of Consciousness: awake, alert  and oriented  Airway & Oxygen Therapy: Patient Spontanous Breathing and Patient connected to nasal cannula oxygen  Post-op Assessment: Report given to RN and Post -op Vital signs reviewed and stable  Post vital signs: Reviewed and stable  Last Vitals:  Vitals:   07/14/16 1156 07/14/16 1421  BP: (!) 159/87 (!) 151/74  Pulse: (!) 118 82  Resp:  18  Temp:  37.1 C    Last Pain:  Vitals:   07/14/16 1218  TempSrc:   PainSc: Asleep         Complications: No apparent anesthesia complications

## 2016-07-14 NOTE — Anesthesia Preprocedure Evaluation (Addendum)
Anesthesia Evaluation  Patient identified by MRN, date of birth, ID band Patient awake    Reviewed: Allergy & Precautions, H&P , NPO status , Patient's Chart, lab work & pertinent test results, reviewed documented beta blocker date and time   History of Anesthesia Complications Negative for: history of anesthetic complications  Airway Mallampati: III  TM Distance: >3 FB Neck ROM: limited    Dental  (+) Poor Dentition, Missing, Edentulous Upper, Edentulous Lower   Pulmonary shortness of breath and with exertion, pneumonia, resolved,    Pulmonary exam normal breath sounds clear to auscultation       Cardiovascular Exercise Tolerance: Good hypertension, Pt. on medications and Pt. on home beta blockers (-) angina+ CAD, + Past MI, +CHF and + DOE  Normal cardiovascular examII Rhythm:regular Rate:Normal     Neuro/Psych PSYCHIATRIC DISORDERS Anxiety Depression TIA Neuromuscular disease CVA, Residual Symptoms    GI/Hepatic Neg liver ROS, hiatal hernia, PUD, GERD  Controlled,Gastroparesis Hx   Endo/Other  diabetes, Type 1, Insulin DependentHypothyroidism Hyperthyroidism   Renal/GU stones  negative genitourinary   Musculoskeletal  (+) Arthritis , Osteoarthritis,    Abdominal   Peds negative pediatric ROS (+)  Hematology negative hematology ROS (+) anemia ,   Anesthesia Other Findings Past Medical History:   Type 1 diabetes ([place])                                          Comment:on levemir   Hypothyroid                                                  Degenerative disk disease                                    Stomach ulcer                                                Diverticulitis                                               Syncope                                         01/2015       Anxiety                                                      GERD (gastroesophageal reflux disease)                       History of  hiatal hernia  Cancer ([place])                                                   Comment:HX OF CANCER OF UTERUS    TIA (transient ischemic attack)                 02/2015       Hypertension                                                 Pancreatitis                                                 PAF (paroxysmal atrial fibrillation) ([place])      03/2015        Comment:a. new onset 03/2015 in setting of intractable               N/V; b. on Eliquis 5 mg bid; c. CHADSVASc 4               (DM, TIA x 2, female)   Intussusception intestine ([place])                 05/2015      Stroke ([place])                                                   Comment:with minimal left sided weakness   Allergy                                                      Cirrhosis of liver not due to alcohol ([place])     2016         Gastroparesis                                                Sick sinus syndrome [facility])                                   Past Surgical History:   HERNIA REPAIR                                                 ABDOMINAL HYSTERECTOMY                                        CHOLECYSTECTOMY  ESOPHAGOGASTRODUODENOSCOPY                      N/A 04/04/2015      Comment:Procedure: ESOPHAGOGASTRODUODENOSCOPY (EGD);                Surgeon: Hulen Luster, MD;  Location: [facility]               ENDOSCOPY;  Service: Endoscopy;  Laterality:               N/A;   CARDIAC CATHETERIZATION                         N/A 01/12/2016       Comment:Procedure: Left Heart Cath and Coronary               Angiography;  Surgeon: Wellington Hampshire, MD;                Location: [place] [facility] LAB;  Service:               Cardiovascular;  Laterality: N/A;  BMI    Body Mass Index   24.15 kg/m 2      Reproductive/Obstetrics negative OB ROS                             Anesthesia Physical  Anesthesia Plan  ASA: IV and  emergent  Anesthesia Plan: General   Post-op Pain Management:    Induction: Intravenous  Airway Management Planned: Oral ETT  Additional Equipment:   Intra-op Plan:   Post-operative Plan: Extubation in OR  Informed Consent: I have reviewed the patients History and Physical, chart, labs and discussed the procedure including the risks, benefits and alternatives for the proposed anesthesia with the patient or authorized representative who has indicated his/her understanding and acceptance.   Dental Advisory Given  Plan Discussed with: Anesthesiologist, CRNA and Surgeon  Anesthesia Plan Comments: (Patient has cardiac clearance for this procedure.   Patient informed that they are higher risk for complications from anesthesia during this procedure due to their medical history.  Patient voiced understanding. )        Anesthesia Quick Evaluation

## 2016-07-14 NOTE — Care Management Obs Status (Signed)
[place] NOTIFICATION   Patient Details  Name: [name] MRN: [id number] Date of Birth: Apr 09, 1956   Medicare Observation Status Notification Given:  Yes (pt unable to sign due to Enteric Precautions; Witnessed by Bedside RN. Copy of MOON left at bedside. )    Alanzo Lamb, Antony Haste, RN 07/14/2016, 7:52 PM

## 2016-07-14 NOTE — Anesthesia Procedure Notes (Signed)
Procedure Name: Intubation Date/Time: 07/14/2016 3:00 PM Performed by: Doreen Salvage Pre-anesthesia Checklist: Patient identified, Patient being monitored, Timeout performed, Emergency Drugs available and Suction available Patient Re-evaluated:Patient Re-evaluated prior to inductionOxygen Delivery Method: Circle system utilized Preoxygenation: Pre-oxygenation with 100% oxygen Intubation Type: IV induction Ventilation: Mask ventilation without difficulty Laryngoscope Size: Mac and 3 Grade View: Grade I Tube type: Oral Tube size: 7.0 mm Number of attempts: 1 Airway Equipment and Method: Stylet Placement Confirmation: ETT inserted through vocal cords under direct vision,  positive ETCO2 and breath sounds checked- equal and bilateral Secured at: 21 cm Tube secured with: Tape Dental Injury: Teeth and Oropharynx as per pre-operative assessment

## 2016-07-14 NOTE — ED Notes (Signed)
Performed in and out cath with NT Carney Harder

## 2016-07-14 NOTE — Care Management Obs Status (Signed)
[facility] NOTIFICATION   Patient Details  Name: [name] MRN: [id number] Date of Birth: 03-27-1956   Medicare Observation Status Notification Given:  Yes    CrutchfieldAntony Haste, RN 07/14/2016, 7:51 PM

## 2016-07-14 NOTE — ED Provider Notes (Signed)
Patient received in signout from Dr. Dineen Kid. CT imaging shows evidence of obstructing left UPJ the stone. Remainder of CT imaging without acute abnormality. Patient persistently tachycardic and hypertensive. We will redose pain medication.  Currently awaiting Ua.  Ua without evidence of infection.  Patient with persistent tachycardia and HTN.  Pain uncontrolled despite multiple IV doses.  Will require admission for further management.     Merlyn Lot, MD 07/14/16 [phone]

## 2016-07-15 DIAGNOSIS — R319 Hematuria, unspecified: Secondary | ICD-10-CM | POA: Diagnosis not present

## 2016-07-15 DIAGNOSIS — R112 Nausea with vomiting, unspecified: Secondary | ICD-10-CM | POA: Diagnosis not present

## 2016-07-15 DIAGNOSIS — E876 Hypokalemia: Secondary | ICD-10-CM | POA: Diagnosis not present

## 2016-07-15 DIAGNOSIS — N200 Calculus of kidney: Secondary | ICD-10-CM | POA: Diagnosis not present

## 2016-07-15 LAB — BASIC METABOLIC PANEL
ANION GAP: 7 (ref 5–15)
BUN: 17 mg/dL (ref 6–20)
CALCIUM: 8.5 mg/dL — AB (ref 8.9–10.3)
CO2: 20 mmol/L — AB (ref 22–32)
Chloride: 108 mmol/L (ref 101–111)
Creatinine, Ser: 0.88 mg/dL (ref 0.44–1.00)
GFR calc Af Amer: >60 mL/min (ref >60)
GFR calc non Af Amer: >60 mL/min (ref >60)
GLUCOSE: 336 mg/dL — AB (ref 65–99)
Potassium: 5.1 mmol/L (ref 3.5–5.1)
Sodium: 135 mmol/L (ref 135–145)

## 2016-07-15 LAB — CBC
HEMATOCRIT: 27.2 % — AB (ref 35.0–47.0)
Hemoglobin: 8.7 g/dL — ABNORMAL LOW (ref 12.0–16.0)
MCH: 24.5 pg — ABNORMAL LOW (ref 26.0–34.0)
MCHC: 32 g/dL (ref 32.0–36.0)
MCV: 76.6 fL — AB (ref 80.0–100.0)
Platelets: 108 10*3/uL — ABNORMAL LOW (ref 150–440)
RBC: 3.55 MIL/uL — ABNORMAL LOW (ref 3.80–5.20)
RDW: 19.1 % — AB (ref 11.5–14.5)
WBC: 3.6 10*3/uL (ref 3.6–11.0)

## 2016-07-15 LAB — HEMOGLOBIN: Hemoglobin: 8.4 g/dL — ABNORMAL LOW (ref 12.0–16.0)

## 2016-07-15 LAB — GLUCOSE, CAPILLARY
Glucose-Capillary: 227 mg/dL — ABNORMAL HIGH (ref 65–99)
Glucose-Capillary: 181 mg/dL — ABNORMAL HIGH (ref 65–99)
Glucose-Capillary: 214 mg/dL — ABNORMAL HIGH (ref 65–99)
Glucose-Capillary: 198 mg/dL — ABNORMAL HIGH (ref 65–99)

## 2016-07-15 NOTE — Discharge Instructions (Addendum)
Resume diet and activity as before.  Follow up with Alexis Frock MD( Urology) in 1 week

## 2016-07-15 NOTE — Final Consult Note (Signed)
Consultant Final Sign-Off Note    Assessment/Final recommendations  [name] is a 60 y.o. female followed by me for  Right Ureteral Stone with Hematuria   Wound care (if applicable):    Diet at discharge: per primary team   Activity at discharge: per primary team   Follow-up appointment:  [facility] in about 2 weeks for office stent pull. Office will call her with appointment.    Pending results:  [employer]     Ordered   07/15/16 1600  Hemoglobin  Every 12 hours (non-specified),   TIMED     07/15/16 7425   07/14/16 1549  Stone analysis  TIMED    Comments:  Specimen A: Phone #No Call Back Requested         Previous Biopsy:  no Is the patient on airborne/droplet precautions? No           Clinical History:  N/A Copy of Report to:  N/A Specimen Disposition: Clinical Lab Pre-op diagnosis: right ureteral obstructing stone    07/14/16 1549       Medication recommendations:   Other recommendations:    Thank you for allowing [name] to participate in the care of your patient!  Please consult [name] again if you have further needs for your patient.  Nickolette Espinola 07/15/2016 3:02 PM    Subjective   1 - Right UPJ Stone with Refractory Nausea / Vomitting  - s/p right ureteroscopy / laser / stent 11/4/`7 for 64m Rt UPJ sotne with likely intermitant obstruction by CT on eval abdominal pain and nauseas with emesis. No additional stones.   2 - Gross Hematuria - new gross hematuria 07/2016. CT Urogram with Rt UPJ stone as per above, no GU masses. Cysto at time of stone surgery without lower tract lesions.   Today "[name] is seen stable. Having some stent colic and ongoing hematuria as anticipated. Still with bothersome nausea.   Objective  Vital signs in last 24 hours: Temp:  [96.8 F (36 C)-98.4 F (36.9 C)] 97.6 F (36.4 C) (11/05 1414) Pulse Rate:  [82-97] 82 (11/05 1414) Resp:  [18-20] 18 (11/05 1414) BP: (128-176)/(62-89) 128/62  (11/05 1414) SpO2:  [97 %-100 %] 97 % (11/05 1414) Weight:  [58.6 kg (129 lb 1.6 oz)] 58.6 kg (129 lb 1.6 oz) (11/05 0500)  General: NAD, AOx3 Non-labored breathign on room air No reboung / guarding No CVAT No foley No c/c/e   Pertinent labs and Studies:  Recent Labs  07/13/16 2110 07/15/16 0311  WBC 5.6 3.6  HGB 10.2* 8.7*  HCT 32.1* 27.2*   BMET  Recent Labs  07/13/16 2110 07/15/16 0311  NA 135 135  K 3.1* 5.1  CL 104 108  CO2 21* 20*  GLUCOSE 229* 336*  BUN 12 17  CREATININE 0.51 0.88  CALCIUM 9.2 8.5*   No results for input(s): LABURIN in the last 72 hours. Results for orders placed or performed during the hospital encounter of 03/13/16  C difficile quick scan w PCR reflex     Status: None   Collection Time: 03/15/16  6:33 PM  Result Value Ref Range Status   C Diff antigen NEGATIVE NEGATIVE Final   C Diff toxin NEGATIVE NEGATIVE Final   C Diff interpretation No C. difficile detected.  Final    Imaging: No results found.

## 2016-07-15 NOTE — Anesthesia Postprocedure Evaluation (Signed)
Anesthesia Post Note  Patient: [name]  Procedure(s) Performed: Procedure(s) (LRB): CYSTOSCOPY/URETEROSCOPY/HOLMIUM LASER (Right)  Patient location during evaluation: PACU Anesthesia Type: General Level of consciousness: awake and alert and oriented Pain management: pain level controlled Vital Signs Assessment: post-procedure vital signs reviewed and stable Respiratory status: spontaneous breathing Cardiovascular status: blood pressure returned to baseline Anesthetic complications: no    Last Vitals:  Vitals:   07/14/16 2116 07/14/16 2117  BP: (!) 176/89 (!) 175/80  Pulse: 97 95  Resp: 20   Temp:      Last Pain:  Vitals:   07/15/16 0115  TempSrc:   PainSc: 8                  Tahlia Deamer

## 2016-07-15 NOTE — Progress Notes (Signed)
[place] at [place] NAME: [name]    MR#:  [id number]  DATE OF BIRTH:  [birth year]  SUBJECTIVE:  CHIEF COMPLAINT:   Chief Complaint  Patient presents with  . Abdominal Pain  . Emesis  . Diarrhea  . Hematuria   Had urological procedure yesterday. Hematuria. Chronic abdominal pain is the same. Afebrile.  REVIEW OF SYSTEMS:    Review of Systems  Constitutional: Positive for malaise/fatigue. Negative for chills and fever.  HENT: Negative for sore throat.   Eyes: Negative for blurred vision, double vision and pain.  Respiratory: Negative for cough, hemoptysis, shortness of breath and wheezing.   Cardiovascular: Negative for chest pain, palpitations, orthopnea and leg swelling.  Gastrointestinal: Positive for abdominal pain and nausea. Negative for constipation, diarrhea, heartburn and vomiting.  Genitourinary: Positive for flank pain and hematuria. Negative for dysuria.  Musculoskeletal: Negative for back pain and joint pain.  Skin: Negative for rash.  Neurological: Positive for weakness. Negative for sensory change, speech change, focal weakness and headaches.  Endo/Heme/Allergies: Does not bruise/bleed easily.  Psychiatric/Behavioral: Negative for depression. The patient is not nervous/anxious.     DRUG ALLERGIES:   Allergies  Allergen Reactions  . Hydrocodone Other (See Comments)    Pt states that this medication caused cirrhosis of the liver.    . Aspirin   . Erythromycin Other (See Comments)    Reaction:  Fever   . Prednisone Other (See Comments)    Reaction:  Unknown   . Rosiglitazone Maleate Swelling  . Codeine Sulfate Rash  . Tetanus-Diphtheria Toxoids Td Rash and Other (See Comments)    Reaction:  Fever     VITALS:  Blood pressure (!) 161/76, pulse 86, temperature 98.3 F (36.8 C), temperature source Oral, resp. rate 20, height 5' 3"  (1.6 m), weight 58.6 kg (129 lb 1.6 oz), SpO2 99 %.  PHYSICAL EXAMINATION:    Physical Exam  GENERAL:  60 y.o.-year-old patient lying in the bed with no acute distress.  EYES: Pupils equal, round, reactive to light and accommodation. No scleral icterus. Extraocular muscles intact.  HEENT: Head atraumatic, normocephalic. Oropharynx and nasopharynx clear.  NECK:  Supple, no jugular venous distention. No thyroid enlargement, no tenderness.  LUNGS: Normal breath sounds bilaterally, no wheezing, rales, rhonchi. No use of accessory muscles of respiration.  CARDIOVASCULAR: S1, S2 normal. No murmurs, rubs, or gallops.  ABDOMEN: Soft, nontender, nondistended. Bowel sounds present. No organomegaly or mass.  EXTREMITIES: No cyanosis, clubbing or edema b/l.    NEUROLOGIC: Cranial nerves II through XII are intact. No focal Motor or sensory deficits b/l.   PSYCHIATRIC: The patient is alert and oriented x 3.  SKIN: No obvious rash, lesion, or ulcer.   LABORATORY PANEL:   CBC  Recent Labs Lab 07/15/16 0311  WBC 3.6  HGB 8.7*  HCT 27.2*  PLT 108*   ------------------------------------------------------------------------------------------------------------------ Chemistries   Recent Labs Lab 07/09/16 0132  07/13/16 2110 07/15/16 0311  NA 137  < > 135 135  K 3.4*  < > 3.1* 5.1  CL 106  < > 104 108  CO2 20*  < > 21* 20*  GLUCOSE 294*  < > 229* 336*  BUN 24*  < > 12 17  CREATININE 0.88  < > 0.51 0.88  CALCIUM 9.2  < > 9.2 8.5*  MG 1.6*  --   --   --   AST  --   --  20  --   ALT  --   --  24  --   ALKPHOS  --   --  102  --   BILITOT  --   --  1.3*  --   < > = values in this interval not displayed. ------------------------------------------------------------------------------------------------------------------  Cardiac Enzymes  Recent Labs Lab 07/13/16 2110  TROPONINI <0.03   ------------------------------------------------------------------------------------------------------------------  RADIOLOGY:  Ct Abdomen Pelvis W Contrast  Result Date:  07/13/2016 CLINICAL DATA:  Upper abdominal pain, nausea and vomiting. Hematuria. Four days duration. No improvement with antibiotic started 2 days ago. EXAM: CT ABDOMEN AND PELVIS WITH CONTRAST TECHNIQUE: Multidetector CT imaging of the abdomen and pelvis was performed using the standard protocol following bolus administration of intravenous contrast. CONTRAST:  164m ISOVUE-300 IOPAMIDOL (ISOVUE-300) INJECTION 61% COMPARISON:  03/13/2016 FINDINGS: Lower chest: No acute abnormality. Hepatobiliary: Cirrhotic liver without focal lesion. Portal vein is patent. Evidence of portal hypertension, with prominent varices of the distal esophagus and upper abdomen. Prior cholecystectomy. Normal bile ducts. Pancreas: Unremarkable. No pancreatic ductal dilatation or surrounding inflammatory changes. Spleen: Normal in size without focal abnormality. Adrenals/Urinary Tract: Obstructing right ureteropelvic junction calculus measuring 5 x 8 x 10 mm, previously located in the lower pole collecting system on 03/13/16. Mural enhancement of the right ureter and right collecting system, nonspecific but worrisome for upper tract infection. At least 2 collecting system calculi of the left midpole on left lower pole, 2-3 mm each. No significant renal parenchymal lesions. Urinary bladder is unremarkable. Stomach/Bowel: Distal esophageal varices. Stomach, small bowel and colon are unremarkable. Vascular/Lymphatic: The abdominal aorta is normal in caliber with mild atherosclerotic calcification. No adenopathy is evident in the abdomen or pelvis. Reproductive: Hysterectomy.  No adnexal abnormalities. Other: Small volume ascites, decreased from 03/13/2016 Musculoskeletal: No acute or significant osseous findings. IMPRESSION: 1. Obstructing right UPJ calculus measuring 5 x 8 x 10 mm. Nonspecific mural enhancement of the right ureter right collecting system, sometimes seen with upper tract infection. 2. Left nephrolithiasis. 3. Cirrhosis with  small volume ascites. Upper abdominal and distal esophageal varices. Electronically Signed   By: DAndreas NewportM.D.   On: 07/13/2016 23:59     ASSESSMENT AND PLAN:   This is a 60year old female admitted for intractable nausea and vomiting.  1. Acute right UPJ stone with obstruction. No UTI. Status post surgery by urology. Pain improved. Has significant hematuria postprocedure.  Hold Eliquis.  2. Acute blood loss anemia due to hematuria No need for transfusion. Repeat hemoglobin today evening and tomorrow morning. Transfuse if less than 7.  3. Cirrhosis: Minimal ascites; continue rifaximin, Lasix. SBP unlikely at this time. Hold Aldactone due to mild hyperkalemia  4. Diabetes mellitus: Continue basal insulin as well as sliding scale.  5. Atrial fibrillation: Paroxysmal; metoprolol for rate control. Stop it was due to significant hematuria  6. Hypothyroidism: Continue Synthroid.  7. Hyperlipidemia: Continue statin therapy  8. Asthma: Controlled; Continue inhaled corticosteroid. Albuterol as needed  9. Chronic abdominal pain is same. No change. Continue home pain medications.  All the records are reviewed and case discussed with Care Management/Social Workerr. Management plans discussed with the patient, family and they are in agreement.  CODE STATUS: FULL  DVT Prophylaxis: SCDs  TOTAL TIME TAKING CARE OF THIS PATIENT: 35 minutes.   POSSIBLE D/C IN 1-2 DAYS, DEPENDING ON CLINICAL CONDITION.  SHillary BowR M.D on 07/15/2016 at 10:06 AM  Between 7am to 6pm - Pager - [phone]  After 6pm go to [email or web address] - password [email or web address]  Office  [phone] CC:  Primary care physician; Sheral Flow, NP  Note: This dictation was prepared with Dragon dictation along with smaller phrase technology. Any transcriptional errors that result from this process are unintentional.

## 2016-07-15 NOTE — Progress Notes (Signed)
Initial Nutrition Assessment  DOCUMENTATION CODES:   Non-severe (moderate) malnutrition in context of chronic illness  INTERVENTION:  1. Glucerna Shake po TID, each supplement provides 220 kcal and 10 grams of protein  NUTRITION DIAGNOSIS:   Malnutrition related to chronic illness as evidenced by percent weight loss, moderate depletion of body fat, moderate depletions of muscle mass.  GOAL:   Patient will meet greater than or equal to 90% of their needs  MONITOR:   PO intake, I & O's, Labs, Weight trends, Supplement acceptance  REASON FOR ASSESSMENT:   Malnutrition Screening Tool    ASSESSMENT:   The patient well-known to the hospitalist service with past medical history of nonalcoholic cirrhosis of the liver as well as gastroparesis presents to the emergency department complaining of blood in her urine as well as nausea and vomiting. The patient states that she has been vomiting more than usual since yesterday  Patient is well known to RD. She presents with Nausea/vomiting and gastroparesis. This has resolved, but patient continues to complain of poor PO intake.  She ate 60% of her meal today of crispy baked chicken, green beans, and whipped potatoes. She endorses a 20# wt loss over 2 months or so, per chart she exhibits a 12#/8.5% severe wt loss over 1.25 months. Is currently consuming Glucerna Shakes, tolerating. Nutrition-Focused physical exam completed. Findings are moderate fat depletion, moderate muscle depletion, and no edema.  States pain and hematuria is the biggest barrier to her PO intake at this time. She is s/p R Cytoscopy/Ureteroscopy/Holmium laser Has no teeth but seems to eat ok as long as she chooses the correct foods. Labs and medications reviewed: Reglan  Diet Order:  Diet Carb Modified Fluid consistency: Thin; Room service appropriate? Yes  Skin:  Reviewed, no issues  Last BM:  11/3  Height:   Ht Readings from Last 1 Encounters:  07/14/16 5' 3"   (1.6 m)    Weight:   Wt Readings from Last 1 Encounters:  07/15/16 129 lb 1.6 oz (58.6 kg)    Ideal Body Weight:  52.27 kg  BMI:  Body mass index is 22.87 kg/m.  Estimated Nutritional Needs:   Kcal:  1700-1900 calories  Protein:  70-85 gm  Fluid:  >/= 1.7L  EDUCATION NEEDS:   No education needs identified at this time  Satira Anis. Maksim Peregoy, MS, RD LDN Inpatient Clinical Dietitian Pager [phone]

## 2016-07-15 NOTE — Anesthesia Postprocedure Evaluation (Signed)
Anesthesia Post Note  Patient: NATALLIA STELLMACH  Procedure(s) Performed: Procedure(s) (LRB): CYSTOSCOPY/URETEROSCOPY/HOLMIUM LASER (Right)  Patient location during evaluation: PACU Anesthesia Type: General Level of consciousness: awake and alert and oriented Pain management: pain level controlled Vital Signs Assessment: post-procedure vital signs reviewed and stable Respiratory status: spontaneous breathing Cardiovascular status: blood pressure returned to baseline Anesthetic complications: no    Last Vitals:  Vitals:   07/15/16 1039 07/15/16 1414  BP: 134/68 128/62  Pulse: 94 82  Resp: 18 18  Temp: 36.9 C 36.4 C    Last Pain:  Vitals:   07/15/16 1510  TempSrc:   PainSc: Asleep                 Nella Botsford

## 2016-07-16 ENCOUNTER — Encounter: Payer: Self-pay | Admitting: Urology

## 2016-07-16 ENCOUNTER — Other Ambulatory Visit: Payer: Self-pay | Admitting: *Deleted

## 2016-07-16 DIAGNOSIS — R319 Hematuria, unspecified: Secondary | ICD-10-CM | POA: Diagnosis not present

## 2016-07-16 DIAGNOSIS — R112 Nausea with vomiting, unspecified: Secondary | ICD-10-CM | POA: Diagnosis not present

## 2016-07-16 DIAGNOSIS — N200 Calculus of kidney: Secondary | ICD-10-CM | POA: Diagnosis not present

## 2016-07-16 DIAGNOSIS — E876 Hypokalemia: Secondary | ICD-10-CM | POA: Diagnosis not present

## 2016-07-16 LAB — GLUCOSE, CAPILLARY
Glucose-Capillary: 176 mg/dL — ABNORMAL HIGH (ref 65–99)
Glucose-Capillary: 188 mg/dL — ABNORMAL HIGH (ref 65–99)
Glucose-Capillary: 216 mg/dL — ABNORMAL HIGH (ref 65–99)
Glucose-Capillary: 182 mg/dL — ABNORMAL HIGH (ref 65–99)

## 2016-07-16 LAB — HEMOGLOBIN: HEMOGLOBIN: 9 g/dL — AB (ref 12.0–16.0)

## 2016-07-16 MED ORDER — INSULIN DETEMIR 100 UNIT/ML ~~LOC~~ SOLN
7.0000 [IU] | Freq: Every day | SUBCUTANEOUS | Status: DC
  Administered 2016-07-16: 7 [IU] via SUBCUTANEOUS
  Filled 2016-07-16 (×2): qty 0.07

## 2016-07-16 MED ORDER — APIXABAN 5 MG PO TABS
5.0000 mg | ORAL_TABLET | Freq: Two times a day (BID) | ORAL | Status: DC
  Administered 2016-07-16: 5 mg via ORAL
  Filled 2016-07-16 (×2): qty 1

## 2016-07-16 MED ORDER — CEPHALEXIN 500 MG PO CAPS
500.0000 mg | ORAL_CAPSULE | Freq: Two times a day (BID) | ORAL | Status: DC
  Administered 2016-07-16 – 2016-07-17 (×2): 500 mg via ORAL
  Filled 2016-07-16 (×2): qty 1

## 2016-07-16 NOTE — Progress Notes (Signed)
[place] at [place] NAME: Kendallyn Lippold    MR#:  [id number]  DATE OF BIRTH:  [birth year]  SUBJECTIVE: Right flank pain, nausea, hematuria. No fever   CHIEF COMPLAINT:   Chief Complaint  Patient presents with  . Abdominal Pain  . Emesis  . Diarrhea  . Hematuria    REVIEW OF SYSTEMS:    Review of Systems  Constitutional: Positive for malaise/fatigue. Negative for chills and fever.  HENT: Negative for sore throat.   Eyes: Negative for blurred vision, double vision and pain.  Respiratory: Negative for cough, hemoptysis, shortness of breath and wheezing.   Cardiovascular: Negative for chest pain, palpitations, orthopnea and leg swelling.  Gastrointestinal: Positive for abdominal pain and nausea. Negative for constipation, diarrhea, heartburn and vomiting.  Genitourinary: Positive for flank pain and hematuria. Negative for dysuria.  Musculoskeletal: Negative for back pain and joint pain.  Skin: Negative for rash.  Neurological: Positive for weakness. Negative for sensory change, speech change, focal weakness and headaches.  Endo/Heme/Allergies: Does not bruise/bleed easily.  Psychiatric/Behavioral: Negative for depression. The patient is not nervous/anxious.     DRUG ALLERGIES:   Allergies  Allergen Reactions  . Hydrocodone Other (See Comments)    Pt states that this medication caused cirrhosis of the liver.    . Aspirin   . Erythromycin Other (See Comments)    Reaction:  Fever   . Prednisone Other (See Comments)    Reaction:  Unknown   . Rosiglitazone Maleate Swelling  . Codeine Sulfate Rash  . Tetanus-Diphtheria Toxoids Td Rash and Other (See Comments)    Reaction:  Fever     VITALS:  Blood pressure (!) 119/54, pulse 84, temperature 98.6 F (37 C), temperature source Oral, resp. rate (!) 22, height 5' 3"  (1.6 m), weight 56.7 kg (125 lb 1.6 oz), SpO2 98 %.  PHYSICAL EXAMINATION:   Physical Exam  GENERAL:  60 y.o.-year-old  patient lying in the bed with no acute distress.  EYES: Pupils equal, round, reactive to light and accommodation. No scleral icterus. Extraocular muscles intact.  HEENT: Head atraumatic, normocephalic. Oropharynx and nasopharynx clear.  NECK:  Supple, no jugular venous distention. No thyroid enlargement, no tenderness.  LUNGS: Normal breath sounds bilaterally, no wheezing, rales, rhonchi. No use of accessory muscles of respiration.  CARDIOVASCULAR: S1, S2 normal. No murmurs, rubs, or gallops.  ABDOMEN: Soft, nontender, nondistended. Bowel sounds present. No organomegaly or mass. His right flank tenderness present. EXTREMITIES: No cyanosis, clubbing or edema b/l.    NEUROLOGIC: Cranial nerves II through XII are intact. No focal Motor or sensory deficits b/l.   PSYCHIATRIC: The patient is alert and oriented x 3.  SKIN: No obvious rash, lesion, or ulcer.   LABORATORY PANEL:   CBC  Recent Labs Lab 07/15/16 0311  07/16/16 0452  WBC 3.6  --   --   HGB 8.7*  < > 9.0*  HCT 27.2*  --   --   PLT 108*  --   --   < > = values in this interval not displayed. ------------------------------------------------------------------------------------------------------------------ Chemistries   Recent Labs Lab 07/13/16 2110 07/15/16 0311  NA 135 135  K 3.1* 5.1  CL 104 108  CO2 21* 20*  GLUCOSE 229* 336*  BUN 12 17  CREATININE 0.51 0.88  CALCIUM 9.2 8.5*  AST 20  --   ALT 24  --   ALKPHOS 102  --   BILITOT 1.3*  --    ------------------------------------------------------------------------------------------------------------------  Cardiac Enzymes  Recent Labs Lab 07/13/16 2110  TROPONINI <0.03   ------------------------------------------------------------------------------------------------------------------  RADIOLOGY:  No results found.   ASSESSMENT AND PLAN:   This is 60 year old female admitted for intractable nausea and vomiting.  1. Acute right UPJ stone with  obstruction. No UTI. Status post surgery by urology. Pain improved. Has significant hematuria postprocedure.  . Patient hemoglobin is stable. Hematuria, flank pain is expected as per  Urologist. Restart her  eliquis. today.  2. Acute blood loss anemia due to hematuria No need for transfusion. Repeat hemoglobin today evening and tomorrow morning. Transfuse if less than 7.  3. Cirrhosis: Minimal ascites; continue rifaximin, Lasix. SBP unlikely at this time. Hold Aldactone due to mild hyperkalemia  4. Diabetes mellitus: Continue basal insulin as well as sliding scale.  5. Atrial fibrillation: Paroxysmal; metoprolol for rate control.   6. Hypothyroidism: Continue Synthroid.  7. Hyperlipidemia: Continue statin therapy  8. Asthma: Controlled; Continue inhaled corticosteroid. Albuterol as needed  9. Chronic abdominal pain is same. No change. Continue home pain medications. likely d/c tomorrow,spoke to pt about this. All the records are reviewed and case discussed with Care Management/Social Workerr. Management plans discussed with the patient, family and they are in agreement.  CODE STATUS: FULL  DVT Prophylaxis: SCDs  TOTAL TIME TAKING CARE OF THIS PATIENT: 35 minutes.   POSSIBLE D/C IN 1-2 DAYS, DEPENDING ON CLINICAL CONDITION.  Epifanio Lesches M.D on 07/16/2016 at 8:36 AM  Between 7am to 6pm - Pager - [phone]  After 6pm go to [email or web address] - password [email or web address] [facility]  Office  [phone]  CC: Primary care physician; Sheral Flow, NP  Note: This dictation was prepared with Dragon dictation along with smaller phrase technology. Any transcriptional errors that result from this process are unintentional.

## 2016-07-16 NOTE — Progress Notes (Signed)
ANTICOAGULATION CONSULT NOTE - Initial Consult  Pharmacy Consult for apixaban Indication: atrial fibrillation  Allergies  Allergen Reactions  . Hydrocodone Other (See Comments)    Pt states that this medication caused cirrhosis of the liver.    . Aspirin   . Erythromycin Other (See Comments)    Reaction:  Fever   . Prednisone Other (See Comments)    Reaction:  Unknown   . Rosiglitazone Maleate Swelling  . Codeine Sulfate Rash  . Tetanus-Diphtheria Toxoids Td Rash and Other (See Comments)    Reaction:  Fever     Patient Measurements: Height: 5' 3"  (160 cm) Weight: 125 lb 1.6 oz (56.7 kg) IBW/kg (Calculated) : 52.4   Vital Signs: Temp: 98.6 F (37 C) (11/06 0503) Temp Source: Oral (11/06 0503) BP: 119/54 (11/06 0503) Pulse Rate: 84 (11/06 0503)  Labs:  Recent Labs  07/13/16 2110 07/15/16 0311 07/15/16 1548 07/16/16 0452  HGB 10.2* 8.7* 8.4* 9.0*  HCT 32.1* 27.2*  --   --   PLT 144* 108*  --   --   CREATININE 0.51 0.88  --   --   TROPONINI <0.03  --   --   --     Estimated Creatinine Clearance: 54.8 mL/min (by C-G formula based on SCr of 0.88 mg/dL).   Medical History: Past Medical History:  Diagnosis Date  . Allergy   . Anxiety   . Cancer (HCC)    HX OF CANCER OF UTERUS   . Cirrhosis of liver not due to alcohol ([place]) 2016  . Degenerative disk disease   . Diverticulitis   . Gastroparesis   . GERD (gastroesophageal reflux disease)   . History of hiatal hernia   . Hypertension   . Hypothyroid   . Intussusception intestine ([place]) 05/2015  . PAF (paroxysmal atrial fibrillation) ([place]) 03/2015   a. new onset 03/2015 in setting of intractable N/V; b. on Eliquis 5 mg bid; c. CHADSVASc 4 (DM, TIA x 2, female)  . Pancreatitis   . Sick sinus syndrome ([place])   . Stomach ulcer   . Stroke [facility])    with minimal left sided weakness  . Syncope 01/2015  . TIA (transient ischemic attack) 02/2015  . Type 1 diabetes (HCC)    on levemir    Assessment: Pt on apixaban  5 mg BID PTA for AFib was on hold for hematuria s/p procedure. Per RN pt still with red urine and passing clots. Per MD note today: Patient hemoglobin is stable. Hematuria, flank pain is expected as per Urologist. Restart her eliquis today. Spoke with Dr. Vianne Bulls about hematuria and restarting full dose Eliquis. MD stated that hematuria is expected after procedure and she checked the urine and saw no clots; would like to restart Eliquis today.    Goal of Therapy:  Monitor platelets by anticoagulation protocol: Yes   Plan:  Will order home dose of apixaban 5 mg  PO BID. Hgb improved/stable from yesterday.  Pharmacy will continue to follow.   [name] L 07/16/2016,9:28 AM

## 2016-07-16 NOTE — Progress Notes (Signed)
Patient continues with bright red hematuria this shift.  Pain rating between 7-10/10 which does come down to 5/10 with administration of morphine.

## 2016-07-16 NOTE — Progress Notes (Signed)
Inpatient Diabetes Program Recommendations  AACE/ADA: New Consensus Statement on Inpatient Glycemic Control (2015)  Target Ranges:  Prepandial:   less than 140 mg/dL      Peak postprandial:   less than 180 mg/dL (1-2 hours)      Critically ill patients:  140 - 180 mg/dL  Results for CHRISHAWNA, FARINA (MRN [id number]) as of 07/16/2016 09:21  Ref. Range 07/15/2016 07:32 07/15/2016 11:33 07/15/2016 17:22 07/15/2016 21:21 07/16/2016 07:48  Glucose-Capillary Latest Ref Range: 65 - 99 mg/dL 227 (H) 181 (H) 214 (H) 198 (H) 176 (H)    Review of Glycemic Control  Outpatient Diabetes medications: Levemir 17 units QHS, Novolog 5 units TID with meals plus correction scale Current orders for Inpatient glycemic control: Levemir 5 units QHS, Novolog 0-15 units TID with meals   Inpatient Diabetes Program Recommendations: Insulin - Basal: Please consider increasing Levemir to 7 units QHS. Correction (SSI): Please consider ordering Novolog bedtime correction scale. Insulin - Meal Coverage: If patient is eating at least 50% of meals, please consider ordering Novolog 2 units TID with meals for meal coverage.  Thanks, Barnie Alderman, RN, MSN, CDE Diabetes Coordinator Inpatient Diabetes Program [phone] (Team Pager from 8am to 5pm)

## 2016-07-16 NOTE — Progress Notes (Signed)
Patient still have hematuria. Dr. Jannifer Franklin paged and informed of scheduled dose of Eliquis at 2200. OK to hold tonight's dose.

## 2016-07-16 NOTE — Op Note (Signed)
[name], [name] NO.:  [id number]  MEDICAL RECORD NO.:  [id number]  LOCATION:                                 FACILITY:  PHYSICIAN:  Alexis Frock, MD     DATE OF BIRTH:  1956/03/28  DATE OF PROCEDURE: 07/14/2016                               OPERATIVE REPORT   PREOPERATIVE DIAGNOSIS:  Right ureteral stone, refractory nausea and vomiting.  PROCEDURE PERFORMED: 1. Cystoscopy with right retrograde pyelogram and interpretation. 2. Right ureteroscopy with laser lithotripsy. 3. Insertion of right ureteral stent, 6 x 24, Contour, no tether.  SURGEON:  Alexis Frock, MD  ESTIMATED BLOOD LOSS:  Nil.  COMPLICATION:  None.  SPECIMEN:  Right renal and ureteral stone fragments for compositional analysis.  FINDINGS: 1. Mild hydronephrosis without ureteronephrosis and filling defect in     proximal ureter consistent with known stone. 2. Right proximal ureteral stone, intermittently obstructing as     expected. 3. Complete resolution of all stone fragments larger than 1/3rd mm     following laser lithotripsy and basket extraction. 4. Successful placement of right ureteral stent, proximal in renal     pelvis and distal in urinary bladder.  INDICATION:  [name] is a pleasant 60 year old lady with extensive medical comorbidity including cirrhosis, chronic abdominal pain, lumbago, with narcotic use, who was admitted early this morning with refractory abdominal pain and found on imaging to have a newly obstructing right proximal ureteral stone.  Consultation was sought. The patient was seen and examined.  She had no infectious parameters of the urinary tract at that time.  It was felt that her right stone could very well be contributing to her refractory nausea and vomiting. Options were discussed including stenting alone versus ureteroscopy with laser lithotripsy and she wished to proceed with the latter.  Informed consent was obtained and placed in the  medical record.  DESCRIPTION OF PROCEDURE:  The patient being [name] was verified. Procedure being right ureteroscopic stone manipulation was confirmed. Procedure was carried out.  Time-out was performed.  Intravenous antibiotics were administered.  General endotracheal anesthesia was introduced.  The patient was placed into a low lithotomy position. Sterile field was created by prepping and draping the patient's vagina, introitus, and proximal thighs using iodinated prep.  Next, cystourethroscopy was performed using a rigid cystoscope with offset lens.  Inspection of the urinary bladder revealed no diverticula, calcifications, or papillary lesions.  The right ureteral orifice was cannulated with a 5-French catheter and right retrograde pyelogram was obtained.  Right retro-pyelogram demonstrated single right ureter with single- system right kidney.  There was a filling defect in the proximal ureter consistent with known stone.  There was mild hydronephrosis above this. A 0.038 Zip wire was advanced to the level of the upper pole, set aside as a safety wire.  Feeding tube placed in urinary bladder for pressure release.  Next, semi-rigid ureteroscopy was performed in the distal four- fifths of the right ureter alongside a separate Sensor working wire.  No mucosal abnormalities were found.  The semi-rigid scope was exchanged for a 35 cm ureteral access sheath which was very carefully navigated to the level of proximal ureter,  taking great care not to advance more proximal than had been visualized ureteroscopically.  Next, flexible digital ureteroscopy was performed using a single channel ureteroscope and inspected the area of the UPJ.  There was an impacted stone.  This did appear to be intermittently obstructing as anticipated.  It appeared to be much too large for simple basketing.  As such, holmium laser energy applied to the stone using settings of 0.3 joules and 30  Hz, approximately dusting 50% of volume and then the remainder volume being fragmented into pieces, approximately 1-2 mm in diameter.  These pieces were then grasped on the long axis sequentially with an Escape basket removed and set aside for compositional analysis.  Following these maneuvers, there was excellent hemostasis.  There was complete resolution of all stone fragments larger than 1/3rd mm.  Systematic inspection of the kidney including all calices x3 once again.  The access sheath was removed under continuous vision.  No mucosal abnormalities were found.  Finally, a new 6 x 24 Contour type stent was placed with remaining safety wire using fluoroscopic guidance.  Good proximal and distal deployment were noted and the procedure was terminated.  The patient tolerated the procedure well.  There were no immediate periprocedural complications.  The patient was taken to the postanesthesia care in stable condition.    ______________________________ Alexis Frock, MD   ______________________________ Alexis Frock, MD    TM/MEDQ  D:  07/14/2016  T:  07/15/2016  Job:  [id number]

## 2016-07-16 NOTE — Patient Outreach (Signed)
[facility]) Care Management [place] outreach 07/16/2016  ELIYA BUBAR 1956-08-24 [id number]  [name] is a 60 y/o female referred to [facility] for transition of care after multiple recent hospitalizations. Patient noted to have had 16 inpatient admissions in 2017, with 3 additional ED visits that resulted in patient discharge from the ED. Patient's most recent hospitalization was October 30- 31, 2017 for chronic gastroparesis and abdominal pain. Patient has history including, but not limited to HTN, HLD, uncontrolled DM type II, paroxysmal A-fib, C-difficile, diverticulitis, chronic pain, and gastroparesis.  Patient was re-hospitalized on Friday July 13, 2016 for blood in urine, nausea and vomiting and kidney stone was discovered through imaging.  Current hospitalization is patient's 17th hospital admission in 2017.  Care Coordination outreach to [place] Minor, Bradley RN [facility] liaison-- Secure communication via EMR sent to [place] notifying of patient's hospital admission.  Plan:  [place] [facility] will follow patient's progress while hospitalized and collaborate with [facility] RN CM hospital liaison.  [name], RN, BSN, [employer] [facility] Care Management  [phone]

## 2016-07-17 ENCOUNTER — Ambulatory Visit: Payer: Self-pay | Admitting: *Deleted

## 2016-07-17 ENCOUNTER — Telehealth: Payer: Self-pay

## 2016-07-17 DIAGNOSIS — R319 Hematuria, unspecified: Secondary | ICD-10-CM | POA: Diagnosis not present

## 2016-07-17 DIAGNOSIS — E876 Hypokalemia: Secondary | ICD-10-CM | POA: Diagnosis not present

## 2016-07-17 DIAGNOSIS — N200 Calculus of kidney: Secondary | ICD-10-CM | POA: Diagnosis not present

## 2016-07-17 LAB — GLUCOSE, CAPILLARY
Glucose-Capillary: 146 mg/dL — ABNORMAL HIGH (ref 65–99)
Glucose-Capillary: 298 mg/dL — ABNORMAL HIGH (ref 65–99)

## 2016-07-17 LAB — CBC
HCT: 27.8 % — ABNORMAL LOW (ref 35.0–47.0)
HEMOGLOBIN: 9 g/dL — AB (ref 12.0–16.0)
MCH: 24.8 pg — AB (ref 26.0–34.0)
MCHC: 32.2 g/dL (ref 32.0–36.0)
MCV: 77.1 fL — ABNORMAL LOW (ref 80.0–100.0)
Platelets: 109 10*3/uL — ABNORMAL LOW (ref 150–440)
RBC: 3.61 MIL/uL — ABNORMAL LOW (ref 3.80–5.20)
RDW: 19.9 % — ABNORMAL HIGH (ref 11.5–14.5)
WBC: 5.8 10*3/uL (ref 3.6–11.0)

## 2016-07-17 MED ORDER — CEPHALEXIN 500 MG PO CAPS: 500.0000 mg | ORAL_CAPSULE | Freq: Two times a day (BID) | ORAL | 0 refills | Status: DC

## 2016-07-17 MED ORDER — OXYCODONE HCL 5 MG PO TABS: 10.0000 mg | ORAL_TABLET | ORAL | 0 refills | Status: DC | PRN

## 2016-07-17 MED ORDER — INSULIN DETEMIR 100 UNIT/ML ~~LOC~~ SOLN: 7.0000 [IU] | Freq: Every day | SUBCUTANEOUS | 11 refills | Status: DC

## 2016-07-17 MED ORDER — MORPHINE SULFATE ER 30 MG PO TBCR: 30.0000 mg | EXTENDED_RELEASE_TABLET | Freq: Two times a day (BID) | ORAL | 0 refills | Status: DC

## 2016-07-17 NOTE — Consult Note (Signed)
Discussed case with Hospitalist.  Patient having hematuria which is likely due to being on Eliquis and having an indwelling stent.  Our plan is to hold the eliquis until Friday, continue ASA 72m and have patient follow-up Friday AM for stent removal.  Encourage water consumption and provide return precautions for urinary retention from blood clots.  Otherwise I recommend proceeding with discharge.

## 2016-07-17 NOTE — Telephone Encounter (Signed)
-----   Message from Lestine Box, LPN sent at 20/04/1387  9:22 AM EST ----- Regarding: FW: Follow up and stent pull   ----- Message ----- From: Alexis Frock, MD Sent: 07/14/2016   3:59 PM To: Royanne Foots, CMA Subject: Follow up and stent pull                       Ms. Langland needs office visit with any MD in about 2 weeks for cysto stent pull.   Had surgery for RIGHT ureteral stone 11/4.  Thanks, T [place]

## 2016-07-17 NOTE — Progress Notes (Signed)
Discharge paperwork reviewed with patient who verbalized understanding. Prescriptions attached for MS Contin and oxycodone- patient aware rest of prescriptions sent electronically. Patient calling taxi cab for transportation home.

## 2016-07-17 NOTE — Discharge Summary (Signed)
[name], is a 60 y.o. female  DOB May 22, 1956  MRN [id number].  Admission date:  07/13/2016  Admitting Physician  Harrie Foreman, MD  Discharge Date:  07/17/2016   Primary MD  Sheral Flow, NP  Recommendations for primary care physician for things to follow:  Follow up with [facility] urology on Friday for stent removal,   Admission Diagnosis  Generalized abdominal pain [R10.84] Nausea vomiting and diarrhea [R11.2, R19.7]   Discharge Diagnosis  Generalized abdominal pain [R10.84] Nausea vomiting and diarrhea [R11.2, R19.7]    Active Problems:   Intractable nausea and vomiting   Right ureteral stone      Past Medical History:  Diagnosis Date  . Allergy   . Anxiety   . Cancer (HCC)    HX OF CANCER OF UTERUS   . Cirrhosis of liver not due to alcohol ([place]) 2016  . Degenerative disk disease   . Diverticulitis   . Gastroparesis   . GERD (gastroesophageal reflux disease)   . History of hiatal hernia   . Hypertension   . Hypothyroid   . Intussusception intestine ([place]) 05/2015  . PAF (paroxysmal atrial fibrillation) ([place]) 03/2015   a. new onset 03/2015 in setting of intractable N/V; b. on Eliquis 5 mg bid; c. CHADSVASc 4 (DM, TIA x 2, female)  . Pancreatitis   . Sick sinus syndrome ([place])   . Stomach ulcer   . Stroke [facility])    with minimal left sided weakness  . Syncope 01/2015  . TIA (transient ischemic attack) 02/2015  . Type 1 diabetes ([place])    on levemir    Past Surgical History:  Procedure Laterality Date  . ABDOMINAL HYSTERECTOMY    . CARDIAC CATHETERIZATION N/A 01/12/2016   Procedure: Left Heart Cath and Coronary Angiography;  Surgeon: Wellington Hampshire, MD;  Location: [facility];  Service: Cardiovascular;  Laterality: N/A;  . CHOLECYSTECTOMY    . CYSTOSCOPY/URETEROSCOPY/HOLMIUM  LASER Right 07/14/2016   Procedure: CYSTOSCOPY/URETEROSCOPY/HOLMIUM LASER;  Surgeon: Alexis Frock, MD;  Location: [facility] ORS;  Service: Urology;  Laterality: Right;  . ESOPHAGOGASTRODUODENOSCOPY N/A 04/04/2015   Procedure: ESOPHAGOGASTRODUODENOSCOPY (EGD);  Surgeon: Hulen Luster, MD;  Location: [facility] ENDOSCOPY;  Service: Endoscopy;  Laterality: N/A;  . ESOPHAGOGASTRODUODENOSCOPY (EGD) WITH PROPOFOL N/A 01/18/2016   Procedure: ESOPHAGOGASTRODUODENOSCOPY (EGD) WITH PROPOFOL;  Surgeon: Lucilla Lame, MD;  Location: [facility] ENDOSCOPY;  Service: Endoscopy;  Laterality: N/A;  . FLEXIBLE SIGMOIDOSCOPY N/A 01/18/2016   Procedure: FLEXIBLE SIGMOIDOSCOPY;  Surgeon: Lucilla Lame, MD;  Location: [facility] ENDOSCOPY;  Service: Endoscopy;  Laterality: N/A;  . HERNIA REPAIR         History of present illness and  Hospital Course:     Kindly see H&P for history of present illness and admission details, please review complete Labs, Consult reports and Test reports for all details in brief  HPI  from the history and physical done on the day of admission Admitted on November 4 because of nausea, vomiting, abdominal pain. Flank pain. Abdominal CAT scan showed obstructing right UPJ calculus .   Hospital Course  #1 obstructive uropathy with right ureterolysis large stone, seen by urology, patient to the did have arm cystoscopy, ureteroscopy, 30 reps it. Patient had right ureteroscopy with laser lithotripsy, insertion of right ureteral stent. Patient right renal and ureteral stone fragments are sent for analysis. #2 patient has extensive comorbidities including cirrhosis of the liver, chronic abdominal pains, chronic narcotic use this time admitted because of abdominal pain, found to have  obstructing right proximal ureteral stone,    #2 hematuria after laser lithotripsy, stent placement: It is expected she will have hematuria for some time. urolgy  wanted to see the patient this Friday. Hold eliquis till then.. Patient allergic to  aspirin. Patient will see [facility] urology this Friday. This is explained to the patient in detail, spoke with the urologist on call Dr. Dayna Ramus. Given Keflex prophylactically for 5 days. #3 chronic abdominal pains, narcotic seeking behavior.; High risk for recurrent admissions, admitted 10 times in the last 6 months. Patient takes MS Contin 30 MG every 12 hours, oxycodone 5 mg every 4 hours as needed for pain. #4. Patient has chronic nausea: Uses when necessary nausea medicines #5 history of liver cirrhosis patient is on rifaxamine,aladactone #6 history of COPD: No wheezing. Continue Symbicort. #7 paroxysmal atrial fibrillation: Continue metoprolol, avoid anticoagulation. Allergy to aspirin. Urology recommended aspirin. But she is allergic to aspirin. For a diabetes mellitus type 2: Diabetes coordinator recommended levemir 7 units at night, NovoLog with coverage. #8 hypothyroidism continue his entire. #9 depression, anxiety: Also has chronic pain syndrome.    Discharge Condition:    Follow UP  Follow-up Information    Sheral Flow, NP Follow up in 1 week(s).   Specialty:  Internal Medicine Contact information: [place] [address] [phone]             Discharge Instructions  and  Discharge Medications        Medication List    STOP taking these medications   apixaban 5 MG Tabs tablet Commonly known as:  ELIQUIS     TAKE these medications   acidophilus Caps capsule Take 2 capsules by mouth daily.   albuterol 108 (90 Base) MCG/ACT inhaler Commonly known as:  PROVENTIL HFA;VENTOLIN HFA Inhale 2 puffs into the lungs every 6 (six) hours as needed for wheezing or shortness of breath.   atorvastatin 40 MG tablet Commonly known as:  LIPITOR Take 1 tablet (40 mg total) by mouth at bedtime.   bismuth subsalicylate 597 CB/63AG suspension Commonly known as:  PEPTO BISMOL Take 30 mLs by mouth every 4 (four) hours as needed for  diarrhea or loose stools.   budesonide-formoterol 160-4.5 MCG/ACT inhaler Commonly known as:  SYMBICORT Inhale 2 puffs into the lungs 2 (two) times daily.   cephALEXin 500 MG capsule Commonly known as:  KEFLEX Take 1 capsule (500 mg total) by mouth every 12 (twelve) hours.   diphenhydrAMINE 25 mg capsule Commonly known as:  BENADRYL Take 2 capsules (50 mg total) by mouth every 6 (six) hours as needed. What changed:  reasons to take this   feeding supplement (GLUCERNA SHAKE) Liqd Take 237 mLs by mouth 3 (three) times daily between meals.   insulin aspart 100 UNIT/ML injection Commonly known as:  novoLOG Inject 5 Units into the skin 3 (three) times daily with meals as needed for high blood sugar. Pt uses as needed per sliding scale.   insulin detemir 100 UNIT/ML injection Commonly known as:  LEVEMIR Inject 0.07 mLs (7 Units total) into the skin at bedtime. What changed:  how much to take   levothyroxine 75 MCG tablet Commonly known as:  SYNTHROID, LEVOTHROID Take 1 tablet (75 mcg total) by mouth daily before breakfast.   metoCLOPramide 10 MG tablet Commonly known as:  REGLAN Take 1 tablet (10 mg total) by mouth 4 (four) times daily -  before meals and at bedtime.   metoprolol tartrate 25 MG tablet  Commonly known as:  LOPRESSOR Take 1 tablet (25 mg total) by mouth 2 (two) times daily.   morphine 30 MG 12 hr tablet Commonly known as:  MS CONTIN Take 1 tablet (30 mg total) by mouth every 12 (twelve) hours.   ondansetron 4 MG disintegrating tablet Commonly known as:  ZOFRAN ODT Take 1 tablet (4 mg total) by mouth every 8 (eight) hours as needed for nausea or vomiting.   oxyCODONE 5 MG immediate release tablet Commonly known as:  Oxy IR/ROXICODONE Take 2 tablets (10 mg total) by mouth every 4 (four) hours as needed for moderate pain.   pantoprazole 40 MG tablet Commonly known as:  PROTONIX Take 1 tablet (40 mg total) by mouth 2 (two) times daily before a meal.    rifaximin 550 MG Tabs tablet Commonly known as:  XIFAXAN Take 1 tablet (550 mg total) by mouth 2 (two) times daily.   spironolactone 25 MG tablet Commonly known as:  ALDACTONE Take 1 tablet (25 mg total) by mouth daily.   tiZANidine 4 MG tablet Commonly known as:  ZANAFLEX Take 1 tablet (4 mg total) by mouth 3 (three) times daily as needed for muscle spasms.         Diet and Activity recommendation: See Discharge Instructions above   Consults obtained - urology   Major procedures and Radiology Reports - PLEASE review detailed and final reports for all details, in brief -      Dg Chest 2 View  Result Date: 07/09/2016 CLINICAL DATA:  Upper abdominal pain for 3 or 4 days. Mid left chest pain since yesterday. Dyspnea onset today. EXAM: CHEST  2 VIEW COMPARISON:  01/28/2016 FINDINGS: The lungs are clear. The pulmonary vasculature is normal. Heart size is normal. Hilar and mediastinal contours are unremarkable. There is no pleural effusion. IMPRESSION: No active cardiopulmonary disease. Electronically Signed   By: Andreas Newport M.D.   On: 07/09/2016 02:27   Ct Abdomen Pelvis W Contrast  Result Date: 07/13/2016 CLINICAL DATA:  Upper abdominal pain, nausea and vomiting. Hematuria. Four days duration. No improvement with antibiotic started 2 days ago. EXAM: CT ABDOMEN AND PELVIS WITH CONTRAST TECHNIQUE: Multidetector CT imaging of the abdomen and pelvis was performed using the standard protocol following bolus administration of intravenous contrast. CONTRAST:  166m ISOVUE-300 IOPAMIDOL (ISOVUE-300) INJECTION 61% COMPARISON:  03/13/2016 FINDINGS: Lower chest: No acute abnormality. Hepatobiliary: Cirrhotic liver without focal lesion. Portal vein is patent. Evidence of portal hypertension, with prominent varices of the distal esophagus and upper abdomen. Prior cholecystectomy. Normal bile ducts. Pancreas: Unremarkable. No pancreatic ductal dilatation or surrounding inflammatory changes.  Spleen: Normal in size without focal abnormality. Adrenals/Urinary Tract: Obstructing right ureteropelvic junction calculus measuring 5 x 8 x 10 mm, previously located in the lower pole collecting system on 03/13/16. Mural enhancement of the right ureter and right collecting system, nonspecific but worrisome for upper tract infection. At least 2 collecting system calculi of the left midpole on left lower pole, 2-3 mm each. No significant renal parenchymal lesions. Urinary bladder is unremarkable. Stomach/Bowel: Distal esophageal varices. Stomach, small bowel and colon are unremarkable. Vascular/Lymphatic: The abdominal aorta is normal in caliber with mild atherosclerotic calcification. No adenopathy is evident in the abdomen or pelvis. Reproductive: Hysterectomy.  No adnexal abnormalities. Other: Small volume ascites, decreased from 03/13/2016 Musculoskeletal: No acute or significant osseous findings. IMPRESSION: 1. Obstructing right UPJ calculus measuring 5 x 8 x 10 mm. Nonspecific mural enhancement of the right ureter right collecting system, sometimes seen with  upper tract infection. 2. Left nephrolithiasis. 3. Cirrhosis with small volume ascites. Upper abdominal and distal esophageal varices. Electronically Signed   By: Andreas Newport M.D.   On: 07/13/2016 23:59    Micro Results    No results found for this or any previous visit (from the past 240 hour(s)).     Today   Subjective:   [name] today has no headache,no chest abdominal pain,no new weakness tingling or numbness, feels much better wants to go home today.   Objective:   Blood pressure (!) 152/67, pulse 79, temperature 98.6 F (37 C), temperature source Oral, resp. rate 16, height 5' 3"  (1.6 m), weight 55.1 kg (121 lb 8 oz), SpO2 96 %.   Intake/Output Summary (Last 24 hours) at 07/17/16 0938 Last data filed at 07/17/16 0349  Gross per 24 hour  Intake              320 ml  Output             1350 ml  Net             -1030 ml    Exam Awake Alert, Oriented x 3, No new F.N deficits, Normal affect [place].AT,PERRAL Supple Neck,No JVD, No cervical lymphadenopathy appriciated.  Symmetrical Chest wall movement, Good air movement bilaterally, CTAB RRR,No Gallops,Rubs or new Murmurs, No Parasternal Heave +ve B.Sounds, Abd Soft, Non tender, No organomegaly appriciated, No rebound -guarding or rigidity. No Cyanosis, Clubbing or edema, No new Rash or bruise  Data Review   CBC w Diff:  Lab Results  Component Value Date   WBC 5.8 07/17/2016   HGB 9.0 (L) 07/17/2016   HGB 12.9 10/13/2014   HCT 27.8 (L) 07/17/2016   HCT 38.4 10/13/2014   PLT 109 (L) 07/17/2016   PLT 113 (L) 10/13/2014   LYMPHOPCT 13 07/13/2016   LYMPHOPCT 24.8 10/13/2014   BANDSPCT 0 10/26/2007   MONOPCT 7 07/13/2016   MONOPCT 9.6 10/13/2014   EOSPCT 0 07/13/2016   EOSPCT 2.0 10/13/2014   BASOPCT 0 07/13/2016   BASOPCT 0.4 10/13/2014    CMP:  Lab Results  Component Value Date   NA 135 07/15/2016   NA 138 10/14/2014   K 5.1 07/15/2016   K 4.1 10/14/2014   CL 108 07/15/2016   CL 107 10/14/2014   CO2 20 (L) 07/15/2016   CO2 26 10/14/2014   BUN 17 07/15/2016   BUN 3 (L) 10/14/2014   CREATININE 0.88 07/15/2016   CREATININE 0.68 10/14/2014   PROT 6.8 07/13/2016   PROT 5.7 (L) 10/12/2014   ALBUMIN 3.8 07/13/2016   ALBUMIN 2.7 (L) 10/12/2014   BILITOT 1.3 (H) 07/13/2016   BILITOT 0.8 10/12/2014   ALKPHOS 102 07/13/2016   ALKPHOS 78 10/12/2014   AST 20 07/13/2016   AST 42 (H) 10/12/2014   ALT 24 07/13/2016   ALT 95 (H) 10/12/2014  .   Total Time in preparing paper work, data evaluation and todays exam - 33 minutes  Pearlean Sabina M.D on 07/17/2016 at 9:38 AM    Note: This dictation was prepared with Dragon dictation along with smaller phrase technology. Any transcriptional errors that result from this process are unintentional.

## 2016-07-18 ENCOUNTER — Telehealth: Payer: Self-pay

## 2016-07-18 ENCOUNTER — Telehealth: Payer: Self-pay | Admitting: Primary Care

## 2016-07-18 ENCOUNTER — Ambulatory Visit: Payer: Commercial Managed Care - HMO | Admitting: Primary Care

## 2016-07-18 NOTE — Telephone Encounter (Signed)
Attempted to reach pt to determine why she did not come in to see PCP for post-hospital f/u appt. Per pt, she was discharged today, 07/18/16, and not 07/17/16. Appt was rescheduled until 11/13. PCP was advised.   Will contact pt at future date to complete TCM outreach.

## 2016-07-18 NOTE — Telephone Encounter (Signed)
Please cancel appointment, she was just released from the hospital today. Also, she established care with me 1 year ago.

## 2016-07-18 NOTE — Telephone Encounter (Signed)
Patient did not come in for their appointment today for new patient appt  Please let me know if patient needs to be contacted immediately for follow up or no follow up needed.

## 2016-07-19 ENCOUNTER — Encounter: Payer: Self-pay | Admitting: *Deleted

## 2016-07-19 ENCOUNTER — Other Ambulatory Visit: Payer: Self-pay | Admitting: *Deleted

## 2016-07-19 NOTE — Patient Outreach (Signed)
[facility]) Care Management [place] Telephone Outreach, Transition of Care, Transition of Care day 1 07/19/2016  [name] 08/17/1956 [id number]  Successful telephone outreach to [name], 60 y/o female referred to [place] for transition of care after multiple recent hospitalizations. Patient noted to have had 17 inpatient admissions in 2017, with 3 additional ED visits that resulted in patient discharge from the ED. Patient's most recent hospitalizations were October 30- 31, 2017 for chronic gastroparesis and abdominal pain. Patient has history including, but not limited to HTN, HLD, uncontrolled DM type II, paroxysmal A-fib, C-difficile, diverticulitis, chronic pain, and gastroparesis.  Patient was re-hospitalized November 3-7, 2017 for blood in urine, nausea and vomiting and kidney stone was discovered through imaging.  Patient had cystoscopy with (R) renal ureter stent placement and was discharged home to self-care. HIPAA/ identity verified with patient during today's phone call.  Today, [name] reports that she "had a rough night with pain last night," stating that she was lying down trying to get some rest.  Agrees to "short" phone call today.  -- Reports has all medications and is taking as prescribed.  Verbalizes accurate reporting of post-hospital discharge instructions to stop anticoagulation therapy and to start antibiotics; patient unwilling during today's phone call to perform complete medication reconciliation, stating she "needs to get some sleep." -- Verbalizes accurate report of upcoming provider appointments to urology tomorrow and to PCP on Monday 07/23/16; states family will transport her to appointments. -- Denies community resource needs, states family assists with care and is supportive.  Patient denies further needs, concerns, issues, or problems today.  Discussed [facility] CM services with patient, and today patient verbalized  understanding/ agreement to [place] involvement in her care.  [place] initial home visit scheduled today for later this month; I confirmed that patient had my direct phone number, the main [facility] CM phone number, and the [facility] 24-hour nurse line phone number.  Plan:  Patient will take her medications as they are prescribed and will attend all scheduled provider appointments.  Patient/ caregiver will contact her providers for any concerns, needs, issues, or problems that arise.  [place] outreach for transition of care to continue with scheduled telephone outreach next week, and in-home visit later this month.  [name], RN, BSN, [employer] [facility] Care Management  [phone]

## 2016-07-20 ENCOUNTER — Other Ambulatory Visit: Payer: Commercial Managed Care - HMO

## 2016-07-23 ENCOUNTER — Ambulatory Visit: Payer: Commercial Managed Care - HMO | Admitting: Primary Care

## 2016-07-24 LAB — STONE ANALYSIS
CA OXALATE, MONOHYDR.: 75 %
Ca Oxalate,Dihydrate: 15 %
Ca phos cry stone ql IR: 10 %
STONE WEIGHT KSTONE: 138 mg

## 2016-07-25 ENCOUNTER — Other Ambulatory Visit: Payer: Commercial Managed Care - HMO

## 2016-07-26 ENCOUNTER — Other Ambulatory Visit: Payer: Self-pay | Admitting: *Deleted

## 2016-07-26 ENCOUNTER — Encounter: Payer: Self-pay | Admitting: *Deleted

## 2016-07-26 NOTE — Patient Outreach (Addendum)
[place] [facility]) Care Management [place] Telephone Outreach, Transition of Care day 8 07/26/2016  MALLEY HAUTER 10/26/1955 [id number]  Unsuccessful telephone outreach to E[place] Poe y/o female referred to [place] for transition of care after multiple recent hospitalizations. Patient noted to have had 17 inpatient admissions in 2017, with 3 additional ED visits that resulted in patient discharge from the ED. Patient's most recent hospitalizations were October 30- 31, 2017 for chronic gastroparesis and abdominal pain. Patient has history including, but not limited to HTN, HLD, uncontrolled DM type II, paroxysmal A-fib, C-difficile, diverticulitis, chronic pain, and gastroparesis. Patient was re-hospitalized November 3-7, 2071fr blood in urine, nausea and vomiting and kidney stone was discovered through imaging. Patient had cystoscopy with (R) renal ureter stent placement and was discharged home to self-care.   Phone rang without answer/ pickup; eventually call ended spontaneously, with intermittent beeping noise on other end; unable to leave voicemail message for patient.  Plan:  [place] for [place] of care rescheduled for tomorrow.    [place] visit previously scheduled for next week.  LOneta Rack RN, BSN, CIntel CorporationTHastings [facility]are Management  [phone]

## 2016-07-27 ENCOUNTER — Other Ambulatory Visit: Payer: Self-pay | Admitting: *Deleted

## 2016-07-27 ENCOUNTER — Encounter: Payer: Self-pay | Admitting: *Deleted

## 2016-07-27 NOTE — Patient Outreach (Signed)
[facility]) Care Management [place] Telephone Outreach, Transition of Care day 9 07/27/2016  LYZBETH GENRICH 03/24/1956 [id number]  Successful telephone outreach to family member Durene Fruits") of [place], [place] y/o female referred to [place] for transition of care after multiple recent hospitalizations. Patient noted to have had 17 inpatient admissions in 2017, with 3 additional ED visits that resulted in patient discharge from the ED. Patient's most recent hospitalizations were October 30- 31, 2017 for chronic gastroparesis and abdominal pain. Patient has history including, but not limited to HTN, HLD, uncontrolled DM type II, paroxysmal A-fib, C-difficile, diverticulitis, chronic pain, and gastroparesis. Patient was re-hospitalized November 3-7, 2051fr blood in urine, nausea and vomiting and kidney stone was discovered through imaging. Patient had cystoscopy with (R) renal ureter stent placement and was discharged home to self-care.   Family member states today that patient is unable to speak to me, as she is lying down; patient in background relayed through "VDurene Fruits that she needed to cancel upcoming in-home visit, previously scheduled for Tuesday, July 31, 2016, as she "has another doctor's appointment that day."  I asked family member to please have patient return my call when she gets up this afternoon, and provided my direct phone number to family member.   Plan:  [place]outreach for transition of care to continue with scheduled telephone outreach next week, if I do not hear back from patient first.   LOneta Rack RN, BSN, CDixCoordinator TEndless Mountains Health SystemsCare Management  [phone]

## 2016-07-30 ENCOUNTER — Encounter: Payer: Self-pay | Admitting: *Deleted

## 2016-07-30 ENCOUNTER — Other Ambulatory Visit: Payer: Self-pay | Admitting: *Deleted

## 2016-07-30 NOTE — Patient Outreach (Addendum)
[facility]) Care Management [place] Telephone Outreach, Transition of Care day 12 07/30/2016  [name] 03-06-1956 [id number]  Successful telephone outreach to [name] y/o female referred to [place] for transition of care after multiple recent hospitalizations. Patient noted to have had 17 inpatient admissions in 2017, with 3 additional ED visits that resulted in patient discharge from the ED. Patient's most recent hospitalizations were October 30- 31, 2017 for chronic gastroparesis and abdominal pain. Patient has history including, but not limited to HTN, HLD, uncontrolled DM type II, paroxysmal A-fib, C-difficile, diverticulitis, chronic pain, narcotic abuse, and gastroparesis. Patient was re-hospitalized November 3-7, 2058fr blood in urine, nausea and vomiting and kidney stone was discovered through imaging. Patient had cystoscopy with (R) renal ureter stent placement and was discharged home to self-care.  HIPAA/ identity verified.  Today patient reports that she "is doing okay," but still having pain.  Patient states that she is not up to talking to me this afternoon.  I attempted to re-schedule previously scheduled in-home visit with patient (cancelled visit was for tomorrow, Tuesday, July 31, 2016), which she cancelled last week, stating that she had a doctor's appointment to attend.  Patient stated that she did not wish to re-schedule in-home visit at this time.    I explained TT[place]services to patient, and she abruptly stated that she "did not want all these calls," and added, "I don't want you to come see me, I don't want you to call me again.... I have a doctor, and that is enough."  I attempted to share with patient benefits of [facility] CM involvement in her care, and she declined, stating, "please just don't keep calling me all the time," and she hung up the phone without allowing me to respond.  Plan:  Will close TM[place]program, as requested by patient, and will make patient's PCP aware of same.    ** copy of this case closure note was routed via EMR to KAllie Bossier NP, PCP for [name] **  LOneta Rack RN, BSN, CCowlicCare Management  [phone]

## 2016-07-31 ENCOUNTER — Ambulatory Visit: Payer: Commercial Managed Care - HMO | Admitting: *Deleted

## 2016-08-03 ENCOUNTER — Emergency Department
Admission: EM | Admit: 2016-08-03 | Discharge: 2016-08-03 | Disposition: A | Discharge status: Home / Self Care | Payer: Commercial Managed Care - HMO | Attending: Emergency Medicine | Admitting: Emergency Medicine

## 2016-08-03 DIAGNOSIS — Z794 Long term (current) use of insulin: Secondary | ICD-10-CM | POA: Diagnosis not present

## 2016-08-03 DIAGNOSIS — I10 Essential (primary) hypertension: Secondary | ICD-10-CM | POA: Insufficient documentation

## 2016-08-03 DIAGNOSIS — E876 Hypokalemia: Secondary | ICD-10-CM | POA: Diagnosis not present

## 2016-08-03 DIAGNOSIS — R197 Diarrhea, unspecified: Secondary | ICD-10-CM | POA: Diagnosis not present

## 2016-08-03 DIAGNOSIS — Z8542 Personal history of malignant neoplasm of other parts of uterus: Secondary | ICD-10-CM | POA: Diagnosis not present

## 2016-08-03 DIAGNOSIS — R110 Nausea: Secondary | ICD-10-CM

## 2016-08-03 DIAGNOSIS — R112 Nausea with vomiting, unspecified: Secondary | ICD-10-CM | POA: Insufficient documentation

## 2016-08-03 DIAGNOSIS — R109 Unspecified abdominal pain: Secondary | ICD-10-CM | POA: Diagnosis not present

## 2016-08-03 DIAGNOSIS — E119 Type 2 diabetes mellitus without complications: Secondary | ICD-10-CM | POA: Diagnosis not present

## 2016-08-03 DIAGNOSIS — E039 Hypothyroidism, unspecified: Secondary | ICD-10-CM | POA: Diagnosis not present

## 2016-08-03 DIAGNOSIS — K2970 Gastritis, unspecified, without bleeding: Secondary | ICD-10-CM | POA: Diagnosis not present

## 2016-08-03 DIAGNOSIS — R9431 Abnormal electrocardiogram [ECG] [EKG]: Secondary | ICD-10-CM

## 2016-08-03 DIAGNOSIS — Z79899 Other long term (current) drug therapy: Secondary | ICD-10-CM | POA: Insufficient documentation

## 2016-08-03 DIAGNOSIS — I4581 Long QT syndrome: Secondary | ICD-10-CM | POA: Diagnosis not present

## 2016-08-03 LAB — CBC WITH DIFFERENTIAL/PLATELET
BASOS ABS: 0 10*3/uL (ref 0–0.1)
Basophils Relative: 0 %
EOS PCT: 0 %
Eosinophils Absolute: 0 10*3/uL (ref 0–0.7)
HEMATOCRIT: 34.5 % — AB (ref 35.0–47.0)
Hemoglobin: 11.3 g/dL — ABNORMAL LOW (ref 12.0–16.0)
LYMPHS ABS: 0.5 10*3/uL — AB (ref 1.0–3.6)
LYMPHS PCT: 6 %
MCH: 25.1 pg — AB (ref 26.0–34.0)
MCHC: 32.6 g/dL (ref 32.0–36.0)
MCV: 77 fL — AB (ref 80.0–100.0)
MONO ABS: 0.2 10*3/uL (ref 0.2–0.9)
MONOS PCT: 3 %
NEUTROS ABS: 6.5 10*3/uL (ref 1.4–6.5)
Neutrophils Relative %: 91 %
Platelets: 209 10*3/uL (ref 150–440)
RBC: 4.48 MIL/uL (ref 3.80–5.20)
RDW: 19.6 % — AB (ref 11.5–14.5)
WBC: 7.2 10*3/uL (ref 3.6–11.0)

## 2016-08-03 LAB — URINALYSIS COMPLETE WITH MICROSCOPIC (ARMC ONLY)
BILIRUBIN URINE: NEGATIVE
Bacteria, UA: NONE SEEN
NITRITE: NEGATIVE
PROTEIN: 100 mg/dL — AB
SPECIFIC GRAVITY, URINE: 1.03 (ref 1.005–1.030)
pH: 5 (ref 5.0–8.0)

## 2016-08-03 LAB — LIPASE, BLOOD: LIPASE: 23 U/L (ref 11–51)

## 2016-08-03 MED ORDER — POTASSIUM CHLORIDE CRYS ER 20 MEQ PO TBCR
40.0000 meq | EXTENDED_RELEASE_TABLET | Freq: Once | ORAL | Status: AC
  Administered 2016-08-03: 40 meq via ORAL
  Filled 2016-08-03: qty 2

## 2016-08-03 MED ORDER — SODIUM CHLORIDE 0.9 % IV BOLUS (SEPSIS)
1000.0000 mL | Freq: Once | INTRAVENOUS | Status: AC
  Administered 2016-08-03: 1000 mL via INTRAVENOUS

## 2016-08-03 MED ORDER — LORAZEPAM 2 MG/ML IJ SOLN
1.0000 mg | Freq: Once | INTRAMUSCULAR | Status: AC
  Administered 2016-08-03: 1 mg via INTRAVENOUS
  Filled 2016-08-03: qty 1

## 2016-08-03 MED ORDER — DICYCLOMINE HCL 10 MG/ML IM SOLN
20.0000 mg | Freq: Once | INTRAMUSCULAR | Status: AC
  Administered 2016-08-03: 20 mg via INTRAMUSCULAR
  Filled 2016-08-03 (×2): qty 2

## 2016-08-03 MED ORDER — PROCHLORPERAZINE EDISYLATE 5 MG/ML IJ SOLN
10.0000 mg | Freq: Once | INTRAMUSCULAR | Status: AC
  Administered 2016-08-03: 10 mg via INTRAVENOUS
  Filled 2016-08-03: qty 2

## 2016-08-03 MED ORDER — GI COCKTAIL ~~LOC~~
30.0000 mL | Freq: Once | ORAL | Status: AC
  Administered 2016-08-03: 30 mL via ORAL
  Filled 2016-08-03: qty 30

## 2016-08-03 NOTE — Discharge Instructions (Signed)
Please seek medical attention for any high fevers, chest pain, shortness of breath, change in behavior, persistent vomiting, bloody stool or any other new or concerning symptoms.  

## 2016-08-03 NOTE — ED Notes (Signed)
Pt requesting pain and nausea medication, EDP aware

## 2016-08-03 NOTE — ED Triage Notes (Signed)
Pt from home via EMS, reports " tearing abd pain" for 3 days, also N/V/D

## 2016-08-03 NOTE — ED Provider Notes (Signed)
[facility] Emergency Department Provider Note   ____________________________________________   I have reviewed the triage vital signs and the nursing notes.   HISTORY  Chief Complaint Abdominal Pain Vomiting  History limited by: Not Limited   HPI [name] is a 60 y.o. female with multiple admissions to the hospital and ED visits for chronic abdominal pain and vomiting diagnosed with gastroparesis who presents to the emergency department today for the same symptoms. She states that it is been going on for the past 3 days. It is severe. She denies any blood in her vomit. States she is tried taking her nausea pills however she throws them up. No fevers.   Past Medical History:  Diagnosis Date  . Allergy   . Anxiety   . Cancer (HCC)    HX OF CANCER OF UTERUS   . Cirrhosis of liver not due to alcohol ([place]) 2016  . Degenerative disk disease   . Diverticulitis   . Gastroparesis   . GERD (gastroesophageal reflux disease)   . History of hiatal hernia   . Hypertension   . Hypothyroid   . Intussusception intestine ([place]) 05/2015  . PAF (paroxysmal atrial fibrillation) ([place]) 03/2015   a. new onset 03/2015 in setting of intractable N/V; b. on Eliquis 5 mg bid; c. CHADSVASc 4 (DM, TIA x 2, female)  . Pancreatitis   . Sick sinus syndrome ([place])   . Stomach ulcer   . Stroke [facility])    with minimal left sided weakness  . Syncope 01/2015  . TIA (transient ischemic attack) 02/2015  . Type 1 diabetes ([place])    on levemir    Patient Active Problem List   Diagnosis Date Noted  . Right ureteral stone 07/14/2016  . Epigastric pain   . Abdominal pain, epigastric   . Personal history of surgery to heart and great vessels, presenting hazards to health   . Gastritis   . Foreign body in stomach   . Abnormal findings-gastrointestinal tract   . Diarrhea   . Congestive dilated cardiomyopathy ([place])   . Generalized abdominal pain   . Nausea & vomiting   . NSTEMI  (non-ST elevated myocardial infarction) ([place]) 01/11/2016  . Tachyarrhythmia 01/10/2016  . Intractable nausea and vomiting 01/10/2016  . Tachy-brady syndrome ([place]) 12/28/2015  . Abdominal pain 12/27/2015  . Colitis 12/16/2015  . Pneumonia 11/14/2015  . Narcotic withdrawal ([place]) 11/11/2015  . Diabetes mellitus type 2, insulin dependent ([place]) 11/11/2015  . Orthostatic hypotension   . H/O TIA (transient ischemic attack) and stroke   . Left-sided weakness 10/04/2015  . Expressive aphasia 10/04/2015  . Ileus ([place]) 08/01/2015  . Ascites   . Cryptogenic cirrhosis ([place]) 07/10/2015  . Paroxysmal atrial fibrillation ([place]) 07/10/2015  . C. difficile colitis 07/10/2015  . GI (gastrointestinal bleed) 07/06/2015  . Malnutrition of moderate degree 07/04/2015  . Symptomatic bradycardia 05/27/2015  . Intussusception intestine ([place])   . Hypothyroidism due to amiodarone   . Abdominal pain, chronic, epigastric   . Chronic anemia 05/19/2015  . Thrombocytopenia ([place]) 05/19/2015  . Prolonged QT interval   . Hypomagnesemia   . Narcotic abuse   . Uncontrolled type 2 diabetes mellitus ([place])   . Syncope due to orthostatic hypotension 05/18/2015  . Hypokalemia 04/06/2015  . Hyperlipidemia with target LDL less than 100 04/06/2015  . CVA (cerebral infarction) 02/15/2015  . Essential hypertension 01/12/2015  . Chronically on opiate therapy 01/12/2015  . Gastroparesis 01/12/2015  . DEPRESSION/ANXIETY 06/27/2007  . MYOFASCIAL PAIN  SYNDROME 06/27/2007  . Chronic pain syndrome 03/28/2007  . GERD 03/27/2007  . DIVERTICULOSIS, COLON 03/27/2007  . LUMBAR DISC DISPLACEMENT 03/27/2007  . PROTEINURIA 03/27/2007  . UTERINE CANCER, HX OF 03/27/2007    Past Surgical History:  Procedure Laterality Date  . ABDOMINAL HYSTERECTOMY    . CARDIAC CATHETERIZATION N/A 01/12/2016   Procedure: Left Heart Cath and Coronary Angiography;  Surgeon: Wellington Hampshire, MD;  Location: [facility] CV LAB;  Service:  Cardiovascular;  Laterality: N/A;  . CHOLECYSTECTOMY    . CYSTOSCOPY/URETEROSCOPY/HOLMIUM LASER Right 07/14/2016   Procedure: CYSTOSCOPY/URETEROSCOPY/HOLMIUM LASER;  Surgeon: Alexis Frock, MD;  Location: [facility] ORS;  Service: Urology;  Laterality: Right;  . ESOPHAGOGASTRODUODENOSCOPY N/A 04/04/2015   Procedure: ESOPHAGOGASTRODUODENOSCOPY (EGD);  Surgeon: Hulen Luster, MD;  Location: [facility]OSCOPY;  Service: Endoscopy;  Laterality: N/A;  . ESOPHAGOGASTRODUODENOSCOPY (EGD) WITH PROPOFOL N/A 01/18/2016   Procedure: ESOPHAGOGASTRODUODENOSCOPY (EGD) WITH PROPOFOL;  Surgeon: Lucilla Lame, MD;  Location: [facility] ENDOSCOPY;  Service: Endoscopy;  Laterality: N/A;  . FLEXIBLE SIGMOIDOSCOPY N/A 01/18/2016   Procedure: FLEXIBLE SIGMOIDOSCOPY;  Surgeon: Lucilla Lame, MD;  Location: [facility] ENDOSCOPY;  Service: Endoscopy;  Laterality: N/A;  . HERNIA REPAIR      Prior to Admission medications   Medication Sig Start Date End Date Taking? Authorizing Provider  acidophilus (RISAQUAD) CAPS capsule Take 2 capsules by mouth daily. 01/25/16   Bettey Costa, MD  albuterol (PROVENTIL HFA;VENTOLIN HFA) 108 (90 Base) MCG/ACT inhaler Inhale 2 puffs into the lungs every 6 (six) hours as needed for wheezing or shortness of breath. 05/15/16   Epifanio Lesches, MD  atorvastatin (LIPITOR) 40 MG tablet Take 1 tablet (40 mg total) by mouth at bedtime. 05/15/16   Epifanio Lesches, MD  bismuth subsalicylate (PEPTO BISMOL) 262 MG/15ML suspension Take 30 mLs by mouth every 4 (four) hours as needed for diarrhea or loose stools. 01/25/16   Bettey Costa, MD  budesonide-formoterol (SYMBICORT) 160-4.5 MCG/ACT inhaler Inhale 2 puffs into the lungs 2 (two) times daily. 05/15/16   Epifanio Lesches, MD  cephALEXin (KEFLEX) 500 MG capsule Take 1 capsule (500 mg total) by mouth every 12 (twelve) hours. 07/17/16   Epifanio Lesches, MD  diphenhydrAMINE (BENADRYL) 25 mg capsule Take 2 capsules (50 mg total) by mouth every 6 (six) hours as needed. Patient taking  differently: Take 50 mg by mouth every 6 (six) hours as needed for itching or allergies.  02/09/16   Carrie Mew, MD  feeding supplement, GLUCERNA SHAKE, (GLUCERNA SHAKE) LIQD Take 237 mLs by mouth 3 (three) times daily between meals. 05/15/16   Epifanio Lesches, MD  insulin aspart (NOVOLOG) 100 UNIT/ML injection Inject 5 Units into the skin 3 (three) times daily with meals as needed for high blood sugar. Pt uses as needed per sliding scale. 05/15/16   Epifanio Lesches, MD  insulin detemir (LEVEMIR) 100 UNIT/ML injection Inject 0.07 mLs (7 Units total) into the skin at bedtime. 07/17/16   Epifanio Lesches, MD  levothyroxine (SYNTHROID, LEVOTHROID) 75 MCG tablet Take 1 tablet (75 mcg total) by mouth daily before breakfast. 05/15/16   Epifanio Lesches, MD  metoCLOPramide (REGLAN) 10 MG tablet Take 1 tablet (10 mg total) by mouth 4 (four) times daily -  before meals and at bedtime. 05/15/16   Epifanio Lesches, MD  metoprolol tartrate (LOPRESSOR) 25 MG tablet Take 1 tablet (25 mg total) by mouth 2 (two) times daily. 07/10/16   Fritzi Mandes, MD  morphine (MS CONTIN) 30 MG 12 hr tablet Take 1 tablet (30 mg total) by mouth  every 12 (twelve) hours. 07/17/16 08/16/16  Epifanio Lesches, MD  ondansetron (ZOFRAN ODT) 4 MG disintegrating tablet Take 1 tablet (4 mg total) by mouth every 8 (eight) hours as needed for nausea or vomiting. 05/15/16   Epifanio Lesches, MD  oxyCODONE (OXY IR/ROXICODONE) 5 MG immediate release tablet Take 2 tablets (10 mg total) by mouth every 4 (four) hours as needed for moderate pain. 07/17/16   Epifanio Lesches, MD  pantoprazole (PROTONIX) 40 MG tablet Take 1 tablet (40 mg total) by mouth 2 (two) times daily before a meal. 05/15/16   Epifanio Lesches, MD  rifaximin (XIFAXAN) 550 MG TABS tablet Take 1 tablet (550 mg total) by mouth 2 (two) times daily. 05/15/16   Epifanio Lesches, MD  spironolactone (ALDACTONE) 25 MG tablet Take 1 tablet (25 mg total) by mouth daily. 05/15/16    Epifanio Lesches, MD  tiZANidine (ZANAFLEX) 4 MG tablet Take 1 tablet (4 mg total) by mouth 3 (three) times daily as needed for muscle spasms. 05/15/16   Epifanio Lesches, MD    Allergies Hydrocodone; Aspirin; Erythromycin; Prednisone; Rosiglitazone maleate; Codeine sulfate; and Tetanus-diphtheria toxoids td  Family History  Problem Relation Age of Onset  . Hypertension Mother   . CAD Sister   . Heart attack Sister     Deceased 2014/11/15  . CAD Brother     Social History Social History  Substance Use Topics  . Smoking status: Never Smoker  . Smokeless tobacco: Never Used  . Alcohol use No    Review of Systems  Constitutional: Negative for fever. Cardiovascular: Negative for chest pain. Respiratory: Negative for shortness of breath. Gastrointestinal: Positive for abdominal pain, vomiting and diarrhea. Genitourinary: Negative for dysuria. Musculoskeletal: Negative for back pain. Skin: Negative for rash. Neurological: Negative for headaches, focal weakness or numbness.  10-point ROS otherwise negative.  ____________________________________________   PHYSICAL EXAM:  VITAL SIGNS: ED Triage Vitals  Enc Vitals Group     BP 08/03/16 1648 (!) 190/91     Pulse Rate 08/03/16 1647 (!) 109     Resp 08/03/16 1647 12     Temp 08/03/16 1644 98.4 F (36.9 C)     Temp Source 08/03/16 1644 Oral     SpO2 08/03/16 1647 98 %     Weight 08/03/16 1644 121 lb (54.9 kg)     Height 08/03/16 1644 5' 3"  (1.6 m)     Head Circumference --      Peak Flow --      Pain Score 08/03/16 1644 10   Constitutional: Alert and oriented. Well appearing and in no distress. Eyes: Conjunctivae are normal. Normal extraocular movements. ENT   Head: Normocephalic and atraumatic.   Nose: No congestion/rhinnorhea.   Mouth/Throat: Mucous membranes are moist.   Neck: No stridor. Hematological/Lymphatic/Immunilogical: No cervical lymphadenopathy. Cardiovascular: Tachycardic, regular rhythm.   No murmurs, rubs, or gallops.  Respiratory: Normal respiratory effort without tachypnea nor retractions. Breath sounds are clear and equal bilaterally. No wheezes/rales/rhonchi. Gastrointestinal: Soft. Occasional dry heave. Tender to palpation somewhat diffusely.  Genitourinary: Deferred Musculoskeletal: Normal range of motion in all extremities. No lower extremity edema. Neurologic:  Normal speech and language. No gross focal neurologic deficits are appreciated.  Skin:  Skin is warm, dry and intact. No rash noted. Psychiatric: Mood and affect are normal. Speech and behavior are normal. Patient exhibits appropriate insight and judgment.  ____________________________________________    LABS (pertinent positives/negatives)  Labs Reviewed  COMPREHENSIVE METABOLIC PANEL - Abnormal; Notable for the following:  Result Value   Potassium 3.2 (*)    CO2 19 (*)    Glucose, Bld 310 (*)    BUN 22 (*)    Total Bilirubin <0.1 (*)    All other components within normal limits  URINALYSIS COMPLETEWITH MICROSCOPIC ([facility] ONLY) - Abnormal; Notable for the following:    Color, Urine AMBER (*)    APPearance CLOUDY (*)    Glucose, UA >500 (*)    Ketones, ur 1+ (*)    Hgb urine dipstick 3+ (*)    Protein, ur 100 (*)    Leukocytes, UA 1+ (*)    Squamous Epithelial / LPF 0-5 (*)    All other components within normal limits  CBC WITH DIFFERENTIAL/PLATELET - Abnormal; Notable for the following:    Hemoglobin 11.3 (*)    HCT 34.5 (*)    MCV 77.0 (*)    MCH 25.1 (*)    RDW 19.6 (*)    Lymphs Abs 0.5 (*)    All other components within normal limits  URINE CULTURE  LIPASE, BLOOD     ____________________________________________   EKG  I, Nance Pear, attending physician, personally viewed and interpreted this EKG  EKG Time: 1912 Rate: 106 Rhythm: sinus tachycardia Axis: nromal Intervals: qtc 509 QRS: narrow, q waves V1 ST changes: no st elevation Impression: abnormal  ekg   ____________________________________________    RADIOLOGY  None  ____________________________________________   PROCEDURES  Procedures  ____________________________________________   INITIAL IMPRESSION / ASSESSMENT AND PLAN / ED COURSE  Pertinent labs & imaging results that were available during my care of the patient were reviewed by me and considered in my medical decision making (see chart for details).  Patient presented to the emergency department today because of concerns for abdominal pain, nausea and vomiting. These are chronic issues for the patient she has multiple ED visits for the same issue. Today's blood work without any leukocytosis. Some mild hypokalemia. Unfortunately ED EKG does show prolonged QT so some therapies were not able to be given to the patient. I discussed with patient that we would be hesitant to give narcotic pain medication and setting of chronic issues. Patient was given IV fluids as well as potassium repletion which she was able to keep down. Patient states she does have medication at home. Will discharge to follow up with primary care. Patient does state that currently she does not have a primary care doctor although it is unclear why she has not established primary care doctor with her numerous ED visits and hospitalizations. Will give patient multiple primary care options on discharge paperwork. _______________________________________   FINAL CLINICAL IMPRESSION(S) / ED DIAGNOSES  Final diagnoses:  Abdominal pain, unspecified abdominal location  Nausea  Hypokalemia  Prolonged QT interval     Note: This dictation was prepared with Dragon dictation. Any transcriptional errors that result from this process are unintentional    Nance Pear, MD 08/03/16 2120

## 2016-08-03 NOTE — ED Notes (Signed)
Pt requesting pain medication, EDP made aware. Offered pt tylenol but pt refused

## 2016-08-05 LAB — COMPREHENSIVE METABOLIC PANEL
ALT: 18 U/L (ref 14–54)
ANION GAP: 12 (ref 5–15)
AST: 23 U/L (ref 15–41)
Albumin: 3.9 g/dL (ref 3.5–5.0)
Alkaline Phosphatase: 100 U/L (ref 38–126)
BUN: 22 mg/dL — AB (ref 6–20)
CHLORIDE: 105 mmol/L (ref 101–111)
CO2: 19 mmol/L — ABNORMAL LOW (ref 22–32)
Calcium: 9.2 mg/dL (ref 8.9–10.3)
Creatinine, Ser: 0.8 mg/dL (ref 0.44–1.00)
GFR calc Af Amer: >60 mL/min (ref >60)
Glucose, Bld: 310 mg/dL — ABNORMAL HIGH (ref 65–99)
POTASSIUM: 3.2 mmol/L — AB (ref 3.5–5.1)
Sodium: 136 mmol/L (ref 135–145)
Total Bilirubin: 0.9 mg/dL (ref 0.3–1.2)
Total Protein: 7.1 g/dL (ref 6.5–8.1)

## 2016-08-05 LAB — URINE CULTURE

## 2016-08-06 ENCOUNTER — Ambulatory Visit: Payer: Commercial Managed Care - HMO | Admitting: Primary Care

## 2016-08-06 ENCOUNTER — Emergency Department: Payer: Commercial Managed Care - HMO

## 2016-08-06 ENCOUNTER — Encounter: Payer: Self-pay | Admitting: Emergency Medicine

## 2016-08-06 ENCOUNTER — Observation Stay
Admission: EM | Admit: 2016-08-06 | Discharge: 2016-08-09 | Disposition: A | Discharge status: Home / Self Care | Payer: Commercial Managed Care - HMO | Attending: Internal Medicine | Admitting: Internal Medicine

## 2016-08-06 DIAGNOSIS — R1084 Generalized abdominal pain: Principal | ICD-10-CM

## 2016-08-06 DIAGNOSIS — Z886 Allergy status to analgesic agent status: Secondary | ICD-10-CM | POA: Insufficient documentation

## 2016-08-06 DIAGNOSIS — J449 Chronic obstructive pulmonary disease, unspecified: Secondary | ICD-10-CM | POA: Insufficient documentation

## 2016-08-06 DIAGNOSIS — K3184 Gastroparesis: Secondary | ICD-10-CM | POA: Diagnosis not present

## 2016-08-06 DIAGNOSIS — R197 Diarrhea, unspecified: Secondary | ICD-10-CM | POA: Diagnosis not present

## 2016-08-06 DIAGNOSIS — I10 Essential (primary) hypertension: Secondary | ICD-10-CM | POA: Diagnosis not present

## 2016-08-06 DIAGNOSIS — G894 Chronic pain syndrome: Secondary | ICD-10-CM | POA: Insufficient documentation

## 2016-08-06 DIAGNOSIS — K766 Portal hypertension: Secondary | ICD-10-CM | POA: Diagnosis not present

## 2016-08-06 DIAGNOSIS — K219 Gastro-esophageal reflux disease without esophagitis: Secondary | ICD-10-CM | POA: Diagnosis not present

## 2016-08-06 DIAGNOSIS — Z8249 Family history of ischemic heart disease and other diseases of the circulatory system: Secondary | ICD-10-CM | POA: Diagnosis not present

## 2016-08-06 DIAGNOSIS — R112 Nausea with vomiting, unspecified: Secondary | ICD-10-CM

## 2016-08-06 DIAGNOSIS — E039 Hypothyroidism, unspecified: Secondary | ICD-10-CM | POA: Diagnosis not present

## 2016-08-06 DIAGNOSIS — I700 Atherosclerosis of aorta: Secondary | ICD-10-CM | POA: Diagnosis not present

## 2016-08-06 DIAGNOSIS — I480 Paroxysmal atrial fibrillation: Secondary | ICD-10-CM | POA: Insufficient documentation

## 2016-08-06 DIAGNOSIS — Z794 Long term (current) use of insulin: Secondary | ICD-10-CM | POA: Insufficient documentation

## 2016-08-06 DIAGNOSIS — R531 Weakness: Secondary | ICD-10-CM | POA: Diagnosis not present

## 2016-08-06 DIAGNOSIS — Z8719 Personal history of other diseases of the digestive system: Secondary | ICD-10-CM | POA: Insufficient documentation

## 2016-08-06 DIAGNOSIS — E119 Type 2 diabetes mellitus without complications: Secondary | ICD-10-CM | POA: Diagnosis not present

## 2016-08-06 DIAGNOSIS — R109 Unspecified abdominal pain: Secondary | ICD-10-CM | POA: Diagnosis not present

## 2016-08-06 DIAGNOSIS — R404 Transient alteration of awareness: Secondary | ICD-10-CM | POA: Diagnosis not present

## 2016-08-06 DIAGNOSIS — I495 Sick sinus syndrome: Secondary | ICD-10-CM | POA: Diagnosis not present

## 2016-08-06 DIAGNOSIS — I69354 Hemiplegia and hemiparesis following cerebral infarction affecting left non-dominant side: Secondary | ICD-10-CM | POA: Insufficient documentation

## 2016-08-06 DIAGNOSIS — K5730 Diverticulosis of large intestine without perforation or abscess without bleeding: Secondary | ICD-10-CM | POA: Insufficient documentation

## 2016-08-06 DIAGNOSIS — M419 Scoliosis, unspecified: Secondary | ICD-10-CM | POA: Insufficient documentation

## 2016-08-06 DIAGNOSIS — K2980 Duodenitis without bleeding: Secondary | ICD-10-CM | POA: Diagnosis not present

## 2016-08-06 DIAGNOSIS — F419 Anxiety disorder, unspecified: Secondary | ICD-10-CM | POA: Insufficient documentation

## 2016-08-06 DIAGNOSIS — Z79891 Long term (current) use of opiate analgesic: Secondary | ICD-10-CM | POA: Insufficient documentation

## 2016-08-06 DIAGNOSIS — K7460 Unspecified cirrhosis of liver: Secondary | ICD-10-CM | POA: Diagnosis not present

## 2016-08-06 DIAGNOSIS — I4891 Unspecified atrial fibrillation: Secondary | ICD-10-CM | POA: Diagnosis not present

## 2016-08-06 DIAGNOSIS — E1043 Type 1 diabetes mellitus with diabetic autonomic (poly)neuropathy: Secondary | ICD-10-CM | POA: Insufficient documentation

## 2016-08-06 DIAGNOSIS — Z90710 Acquired absence of both cervix and uterus: Secondary | ICD-10-CM | POA: Insufficient documentation

## 2016-08-06 DIAGNOSIS — Z888 Allergy status to other drugs, medicaments and biological substances status: Secondary | ICD-10-CM | POA: Insufficient documentation

## 2016-08-06 DIAGNOSIS — Z9049 Acquired absence of other specified parts of digestive tract: Secondary | ICD-10-CM | POA: Insufficient documentation

## 2016-08-06 DIAGNOSIS — R1012 Left upper quadrant pain: Secondary | ICD-10-CM

## 2016-08-06 DIAGNOSIS — F329 Major depressive disorder, single episode, unspecified: Secondary | ICD-10-CM | POA: Diagnosis not present

## 2016-08-06 DIAGNOSIS — R42 Dizziness and giddiness: Secondary | ICD-10-CM | POA: Diagnosis not present

## 2016-08-06 DIAGNOSIS — K7469 Other cirrhosis of liver: Secondary | ICD-10-CM | POA: Insufficient documentation

## 2016-08-06 DIAGNOSIS — Z887 Allergy status to serum and vaccine status: Secondary | ICD-10-CM | POA: Insufficient documentation

## 2016-08-06 DIAGNOSIS — Z0289 Encounter for other administrative examinations: Secondary | ICD-10-CM

## 2016-08-06 DIAGNOSIS — Z881 Allergy status to other antibiotic agents status: Secondary | ICD-10-CM | POA: Insufficient documentation

## 2016-08-06 DIAGNOSIS — Z8542 Personal history of malignant neoplasm of other parts of uterus: Secondary | ICD-10-CM | POA: Insufficient documentation

## 2016-08-06 DIAGNOSIS — N200 Calculus of kidney: Secondary | ICD-10-CM | POA: Diagnosis not present

## 2016-08-06 DIAGNOSIS — Z885 Allergy status to narcotic agent status: Secondary | ICD-10-CM | POA: Insufficient documentation

## 2016-08-06 DIAGNOSIS — Z765 Malingerer [conscious simulation]: Secondary | ICD-10-CM | POA: Insufficient documentation

## 2016-08-06 LAB — GLUCOSE, CAPILLARY
GLUCOSE-CAPILLARY: 499 mg/dL — AB (ref 65–99)
GLUCOSE-CAPILLARY: 337 mg/dL — AB (ref 65–99)
GLUCOSE-CAPILLARY: 334 mg/dL — AB (ref 65–99)

## 2016-08-06 LAB — COMPREHENSIVE METABOLIC PANEL
ALT: 16 U/L (ref 14–54)
AST: 17 U/L (ref 15–41)
Albumin: 3.4 g/dL — ABNORMAL LOW (ref 3.5–5.0)
Alkaline Phosphatase: 93 U/L (ref 38–126)
Anion gap: 8 (ref 5–15)
BUN: 25 mg/dL — AB (ref 6–20)
CHLORIDE: 105 mmol/L (ref 101–111)
CO2: 20 mmol/L — AB (ref 22–32)
CREATININE: 0.86 mg/dL (ref 0.44–1.00)
Calcium: 8.6 mg/dL — ABNORMAL LOW (ref 8.9–10.3)
GFR calc Af Amer: >60 mL/min (ref >60)
Glucose, Bld: 417 mg/dL — ABNORMAL HIGH (ref 65–99)
Potassium: 3.3 mmol/L — ABNORMAL LOW (ref 3.5–5.1)
SODIUM: 133 mmol/L — AB (ref 135–145)
Total Bilirubin: 1.1 mg/dL (ref 0.3–1.2)
Total Protein: 6.1 g/dL — ABNORMAL LOW (ref 6.5–8.1)

## 2016-08-06 LAB — CBC WITH DIFFERENTIAL/PLATELET
BASOS ABS: 0 10*3/uL (ref 0–0.1)
BASOS PCT: 0 %
EOS ABS: 0.1 10*3/uL (ref 0–0.7)
EOS PCT: 1 %
HCT: 34.7 % — ABNORMAL LOW (ref 35.0–47.0)
Hemoglobin: 11.1 g/dL — ABNORMAL LOW (ref 12.0–16.0)
Lymphocytes Relative: 11 %
Lymphs Abs: 1.2 10*3/uL (ref 1.0–3.6)
MCH: 24.5 pg — ABNORMAL LOW (ref 26.0–34.0)
MCHC: 32 g/dL (ref 32.0–36.0)
MCV: 76.7 fL — AB (ref 80.0–100.0)
MONO ABS: 0.9 10*3/uL (ref 0.2–0.9)
Monocytes Relative: 9 %
Neutro Abs: 8.4 10*3/uL — ABNORMAL HIGH (ref 1.4–6.5)
Neutrophils Relative %: 79 %
PLATELETS: 189 10*3/uL (ref 150–440)
RBC: 4.53 MIL/uL (ref 3.80–5.20)
RDW: 20.2 % — AB (ref 11.5–14.5)
WBC: 10.6 10*3/uL (ref 3.6–11.0)

## 2016-08-06 LAB — URINALYSIS COMPLETE WITH MICROSCOPIC (ARMC ONLY)
Bacteria, UA: NONE SEEN
Bilirubin Urine: NEGATIVE
Glucose, UA: >500 mg/dL — AB
KETONES UR: NEGATIVE mg/dL
Nitrite: NEGATIVE
PROTEIN: 100 mg/dL — AB
Specific Gravity, Urine: 1.031 — ABNORMAL HIGH (ref 1.005–1.030)
pH: 6 (ref 5.0–8.0)

## 2016-08-06 LAB — TROPONIN I: Troponin I: 0.03 ng/mL (ref <0.03)

## 2016-08-06 LAB — LIPASE, BLOOD: LIPASE: 28 U/L (ref 11–51)

## 2016-08-06 MED ORDER — PANTOPRAZOLE SODIUM 40 MG PO TBEC
40.0000 mg | DELAYED_RELEASE_TABLET | Freq: Two times a day (BID) | ORAL | Status: DC
  Administered 2016-08-07 – 2016-08-09 (×5): 40 mg via ORAL
  Filled 2016-08-06 (×6): qty 1

## 2016-08-06 MED ORDER — FENTANYL CITRATE (PF) 100 MCG/2ML IJ SOLN
50.0000 ug | Freq: Once | INTRAMUSCULAR | Status: AC
  Administered 2016-08-06: 50 ug via INTRAVENOUS

## 2016-08-06 MED ORDER — SPIRONOLACTONE 25 MG PO TABS
25.0000 mg | ORAL_TABLET | Freq: Every day | ORAL | Status: DC
  Administered 2016-08-07 – 2016-08-09 (×3): 25 mg via ORAL
  Filled 2016-08-06 (×3): qty 1

## 2016-08-06 MED ORDER — SODIUM CHLORIDE 0.9% FLUSH
3.0000 mL | Freq: Two times a day (BID) | INTRAVENOUS | Status: DC
  Administered 2016-08-08 – 2016-08-09 (×2): 3 mL via INTRAVENOUS

## 2016-08-06 MED ORDER — SODIUM CHLORIDE 0.9 % IV BOLUS (SEPSIS)
1000.0000 mL | Freq: Once | INTRAVENOUS | Status: AC
  Administered 2016-08-06: 1000 mL via INTRAVENOUS

## 2016-08-06 MED ORDER — INSULIN ASPART 100 UNIT/ML ~~LOC~~ SOLN
0.0000 [IU] | Freq: Every day | SUBCUTANEOUS | Status: DC
  Administered 2016-08-06: 4 [IU] via SUBCUTANEOUS
  Administered 2016-08-08: 3 [IU] via SUBCUTANEOUS
  Filled 2016-08-06: qty 4
  Filled 2016-08-06: qty 3

## 2016-08-06 MED ORDER — HYDRALAZINE HCL 20 MG/ML IJ SOLN
10.0000 mg | INTRAMUSCULAR | Status: DC | PRN
Start: 2016-08-06 — End: 2016-08-09
  Administered 2016-08-06: 10 mg via INTRAVENOUS
  Filled 2016-08-06: qty 1

## 2016-08-06 MED ORDER — SODIUM CHLORIDE 0.9 % IV SOLN
INTRAVENOUS | Status: DC
  Administered 2016-08-06 – 2016-08-08 (×4): via INTRAVENOUS

## 2016-08-06 MED ORDER — METOPROLOL TARTRATE 25 MG PO TABS
ORAL_TABLET | ORAL | Status: AC
  Administered 2016-08-06: 25 mg via ORAL
  Filled 2016-08-06: qty 1

## 2016-08-06 MED ORDER — FENTANYL CITRATE (PF) 100 MCG/2ML IJ SOLN
INTRAMUSCULAR | Status: AC
  Filled 2016-08-06: qty 2

## 2016-08-06 MED ORDER — MORPHINE SULFATE ER 15 MG PO TBCR
30.0000 mg | EXTENDED_RELEASE_TABLET | Freq: Two times a day (BID) | ORAL | Status: DC
  Administered 2016-08-06 – 2016-08-09 (×6): 30 mg via ORAL
  Filled 2016-08-06 (×6): qty 2

## 2016-08-06 MED ORDER — FENTANYL CITRATE (PF) 100 MCG/2ML IJ SOLN
INTRAMUSCULAR | Status: AC
  Administered 2016-08-06: 50 ug via INTRAVENOUS
  Filled 2016-08-06: qty 2

## 2016-08-06 MED ORDER — OXYCODONE HCL 5 MG PO TABS
ORAL_TABLET | ORAL | Status: AC
  Filled 2016-08-06: qty 2

## 2016-08-06 MED ORDER — METOPROLOL TARTRATE 25 MG PO TABS: 25.0000 mg | ORAL_TABLET | Freq: Two times a day (BID) | ORAL | Status: DC

## 2016-08-06 MED ORDER — MOMETASONE FURO-FORMOTEROL FUM 200-5 MCG/ACT IN AERO
2.0000 | INHALATION_SPRAY | Freq: Two times a day (BID) | RESPIRATORY_TRACT | Status: DC
Start: 2016-08-06 — End: 2016-08-09
  Administered 2016-08-06 – 2016-08-09 (×6): 2 via RESPIRATORY_TRACT
  Filled 2016-08-06: qty 8.8

## 2016-08-06 MED ORDER — RIFAXIMIN 550 MG PO TABS
550.0000 mg | ORAL_TABLET | Freq: Two times a day (BID) | ORAL | Status: DC
  Administered 2016-08-06 – 2016-08-09 (×6): 550 mg via ORAL
  Filled 2016-08-06 (×6): qty 1

## 2016-08-06 MED ORDER — LEVOTHYROXINE SODIUM 50 MCG PO TABS
75.0000 ug | ORAL_TABLET | Freq: Every day | ORAL | Status: DC
  Administered 2016-08-07 – 2016-08-09 (×3): 75 ug via ORAL
  Filled 2016-08-06 (×3): qty 2

## 2016-08-06 MED ORDER — METOPROLOL TARTRATE 25 MG PO TABS
25.0000 mg | ORAL_TABLET | Freq: Once | ORAL | Status: AC
  Administered 2016-08-06: 25 mg via ORAL

## 2016-08-06 MED ORDER — ATORVASTATIN CALCIUM 20 MG PO TABS
40.0000 mg | ORAL_TABLET | Freq: Every day | ORAL | Status: DC
  Administered 2016-08-06 – 2016-08-08 (×3): 40 mg via ORAL
  Filled 2016-08-06 (×3): qty 2

## 2016-08-06 MED ORDER — ALBUTEROL SULFATE (2.5 MG/3ML) 0.083% IN NEBU: 2.5000 mg | INHALATION_SOLUTION | Freq: Four times a day (QID) | RESPIRATORY_TRACT | Status: DC | PRN

## 2016-08-06 MED ORDER — INSULIN ASPART 100 UNIT/ML ~~LOC~~ SOLN
0.0000 [IU] | Freq: Three times a day (TID) | SUBCUTANEOUS | Status: DC
  Administered 2016-08-07: 2 [IU] via SUBCUTANEOUS
  Administered 2016-08-07 (×2): 3 [IU] via SUBCUTANEOUS
  Administered 2016-08-08 (×3): 2 [IU] via SUBCUTANEOUS
  Administered 2016-08-09 (×2): 3 [IU] via SUBCUTANEOUS
  Filled 2016-08-06 (×2): qty 2
  Filled 2016-08-06 (×2): qty 3
  Filled 2016-08-06: qty 1
  Filled 2016-08-06: qty 3
  Filled 2016-08-06 (×3): qty 2

## 2016-08-06 MED ORDER — INSULIN DETEMIR 100 UNIT/ML ~~LOC~~ SOLN
7.0000 [IU] | Freq: Every day | SUBCUTANEOUS | Status: DC
  Filled 2016-08-06: qty 0.07

## 2016-08-06 MED ORDER — OXYCODONE HCL 5 MG PO TABS
10.0000 mg | ORAL_TABLET | Freq: Four times a day (QID) | ORAL | Status: DC | PRN
  Administered 2016-08-06 – 2016-08-08 (×5): 10 mg via ORAL
  Filled 2016-08-06 (×5): qty 2

## 2016-08-06 MED ORDER — HEPARIN SODIUM (PORCINE) 5000 UNIT/ML IJ SOLN
5000.0000 [IU] | Freq: Three times a day (TID) | INTRAMUSCULAR | Status: DC
  Administered 2016-08-06 – 2016-08-08 (×6): 5000 [IU] via SUBCUTANEOUS
  Filled 2016-08-06 (×6): qty 1

## 2016-08-06 NOTE — ED Notes (Signed)
Charge Nurse spoke with inpatient floor regarding BP and HR parameters. Dr. Clayton Bibles was previously paged for nursing communication orders which have already been placed. Was told to hold due to "other" emergency. Pt stable

## 2016-08-06 NOTE — ED Provider Notes (Signed)
[facility] Emergency Department Provider Note  ____________________________________________   First MD Initiated Contact with Patient 08/06/16 1121     (approximate)  I have reviewed the triage vital signs and the nursing notes.   HISTORY  Chief Complaint Abdominal Pain   HPI [name] is a 60 y.o. female with a history of cirrhosis as well as nephrolithiasis with recent stenting who is presenting to the emergency department with 5 days of worsening abdominal pain. She describes the pain as a 6 or 7 out of 10 and sharp to the upper abdomen. She says the pain is constant. Does not describe any radiation. Says that she has had multiple episodes of nausea vomiting and diarrhea without any blood in her stool or vomitus. Does not report any burning with urination or blood in her urine. Was in the emergency department several days ago and was treated with supportive care and then discharged home.  En route with EMS the patient was found to have a rapid heart rate to the 140s and 150s. Also found to have a glucose above 500.   Past Medical History:  Diagnosis Date  . Allergy   . Anxiety   . Cancer (HCC)    HX OF CANCER OF UTERUS   . Cirrhosis of liver not due to alcohol ([place]) 2016  . Degenerative disk disease   . Diverticulitis   . Gastroparesis   . GERD (gastroesophageal reflux disease)   . History of hiatal hernia   . Hypertension   . Hypothyroid   . Intussusception intestine ([place]) 05/2015  . PAF (paroxysmal atrial fibrillation) ([place]) 03/2015   a. new onset 03/2015 in setting of intractable N/V; b. on Eliquis 5 mg bid; c. CHADSVASc 4 (DM, TIA x 2, female)  . Pancreatitis   . Sick sinus syndrome ([place])   . Stomach ulcer   . Stroke [facility])    with minimal left sided weakness  . Syncope 01/2015  . TIA (transient ischemic attack) 02/2015  . Type 1 diabetes ([place])    on levemir    Patient Active Problem List   Diagnosis Date Noted  . Right ureteral  stone 07/14/2016  . Epigastric pain   . Abdominal pain, epigastric   . Personal history of surgery to heart and great vessels, presenting hazards to health   . Gastritis   . Foreign body in stomach   . Abnormal findings-gastrointestinal tract   . Diarrhea   . Congestive dilated cardiomyopathy ([place])   . Generalized abdominal pain   . Nausea & vomiting   . NSTEMI (non-ST elevated myocardial infarction) ([place]) 01/11/2016  . Tachyarrhythmia 01/10/2016  . Intractable nausea and vomiting 01/10/2016  . Tachy-brady syndrome ([place]) 12/28/2015  . Abdominal pain 12/27/2015  . Colitis 12/16/2015  . Pneumonia 11/14/2015  . Narcotic withdrawal ([place]) 11/11/2015  . Diabetes mellitus type 2, insulin dependent ([place]) 11/11/2015  . Orthostatic hypotension   . H/O TIA (transient ischemic attack) and stroke   . Left-sided weakness 10/04/2015  . Expressive aphasia 10/04/2015  . Ileus ([place]) 08/01/2015  . Ascites   . Cryptogenic cirrhosis ([place]) 07/10/2015  . Paroxysmal atrial fibrillation ([place]) 07/10/2015  . C. difficile colitis 07/10/2015  . GI (gastrointestinal bleed) 07/06/2015  . Malnutrition of moderate degree 07/04/2015  . Symptomatic bradycardia 05/27/2015  . Intussusception intestine ([place])   . Hypothyroidism due to amiodarone   . Abdominal pain, chronic, epigastric   . Chronic anemia 05/19/2015  . Thrombocytopenia ([place]) 05/19/2015  . Prolonged  QT interval   . Hypomagnesemia   . Narcotic abuse   . Uncontrolled type 2 diabetes mellitus ([place])   . Syncope due to orthostatic hypotension 05/18/2015  . Hypokalemia 04/06/2015  . Hyperlipidemia with target LDL less than 100 04/06/2015  . CVA (cerebral infarction) 02/15/2015  . Essential hypertension 01/12/2015  . Chronically on opiate therapy 01/12/2015  . Gastroparesis 01/12/2015  . DEPRESSION/ANXIETY 06/27/2007  . MYOFASCIAL PAIN SYNDROME 06/27/2007  . Chronic pain syndrome 03/28/2007  . GERD 03/27/2007  . DIVERTICULOSIS, COLON 03/27/2007    . LUMBAR DISC DISPLACEMENT 03/27/2007  . PROTEINURIA 03/27/2007  . UTERINE CANCER, HX OF 03/27/2007    Past Surgical History:  Procedure Laterality Date  . ABDOMINAL HYSTERECTOMY    . CARDIAC CATHETERIZATION N/A 01/12/2016   Procedure: Left Heart Cath and Coronary Angiography;  Surgeon: Wellington Hampshire, MD;  Location: [place] CV LAB;  Service: Cardiovascular;  Laterality: N/A;  . CHOLECYSTECTOMY    . CYSTOSCOPY/URETEROSCOPY/HOLMIUM LASER Right 07/14/2016   Procedure: CYSTOSCOPY/URETEROSCOPY/HOLMIUM LASER;  Surgeon: Alexis Frock, MD;  Location: [facility] ORS;  Service: Urology;  Laterality: Right;  . ESOPHAGOGASTRODUODENOSCOPY N/A 04/04/2015   Procedure: ESOPHAGOGASTRODUODENOSCOPY (EGD);  Surgeon: Hulen Luster, MD;  Location[facility] ENDOSCOPY;  Service: Endoscopy;  Laterality: N/A;  . ESOPHAGOGASTRODUODENOSCOPY (EGD) WITH PROPOFOL N/A 01/18/2016   Procedure: ESOPHAGOGASTRODUODENOSCOPY (EGD) WITH PROPOFOL;  Surgeon: Lucilla Lame, MD;  Location: [facility] ENDOSCOPY;  Service: Endoscopy;  Laterality: N/A;  . FLEXIBLE SIGMOIDOSCOPY N/A 01/18/2016   Procedure: FLEXIBLE SIGMOIDOSCOPY;  Surgeon: Lucilla Lame, MD;  Location: [facility] ENDOSCOPY;  Service: Endoscopy;  Laterality: N/A;  . HERNIA REPAIR      Prior to Admission medications   Medication Sig Start Date End Date Taking? Authorizing Provider  acidophilus (RISAQUAD) CAPS capsule Take 2 capsules by mouth daily. 01/25/16   Bettey Costa, MD  albuterol (PROVENTIL HFA;VENTOLIN HFA) 108 (90 Base) MCG/ACT inhaler Inhale 2 puffs into the lungs every 6 (six) hours as needed for wheezing or shortness of breath. 05/15/16   Epifanio Lesches, MD  atorvastatin (LIPITOR) 40 MG tablet Take 1 tablet (40 mg total) by mouth at bedtime. 05/15/16   Epifanio Lesches, MD  bismuth subsalicylate (PEPTO BISMOL) 262 MG/15ML suspension Take 30 mLs by mouth every 4 (four) hours as needed for diarrhea or loose stools. 01/25/16   Bettey Costa, MD  budesonide-formoterol (SYMBICORT) 160-4.5  MCG/ACT inhaler Inhale 2 puffs into the lungs 2 (two) times daily. 05/15/16   Epifanio Lesches, MD  cephALEXin (KEFLEX) 500 MG capsule Take 1 capsule (500 mg total) by mouth every 12 (twelve) hours. 07/17/16   Epifanio Lesches, MD  diphenhydrAMINE (BENADRYL) 25 mg capsule Take 2 capsules (50 mg total) by mouth every 6 (six) hours as needed. Patient taking differently: Take 50 mg by mouth every 6 (six) hours as needed for itching or allergies.  02/09/16   Carrie Mew, MD  feeding supplement, GLUCERNA SHAKE, (GLUCERNA SHAKE) LIQD Take 237 mLs by mouth 3 (three) times daily between meals. 05/15/16   Epifanio Lesches, MD  insulin aspart (NOVOLOG) 100 UNIT/ML injection Inject 5 Units into the skin 3 (three) times daily with meals as needed for high blood sugar. Pt uses as needed per sliding scale. 05/15/16   Epifanio Lesches, MD  insulin detemir (LEVEMIR) 100 UNIT/ML injection Inject 0.07 mLs (7 Units total) into the skin at bedtime. 07/17/16   Epifanio Lesches, MD  levothyroxine (SYNTHROID, LEVOTHROID) 75 MCG tablet Take 1 tablet (75 mcg total) by mouth daily before breakfast. 05/15/16   Epifanio Lesches,  MD  metoCLOPramide (REGLAN) 10 MG tablet Take 1 tablet (10 mg total) by mouth 4 (four) times daily -  before meals and at bedtime. 05/15/16   Epifanio Lesches, MD  metoprolol tartrate (LOPRESSOR) 25 MG tablet Take 1 tablet (25 mg total) by mouth 2 (two) times daily. 07/10/16   Fritzi Mandes, MD  morphine (MS CONTIN) 30 MG 12 hr tablet Take 1 tablet (30 mg total) by mouth every 12 (twelve) hours. 07/17/16 08/16/16  Epifanio Lesches, MD  ondansetron (ZOFRAN ODT) 4 MG disintegrating tablet Take 1 tablet (4 mg total) by mouth every 8 (eight) hours as needed for nausea or vomiting. 05/15/16   Epifanio Lesches, MD  oxyCODONE (OXY IR/ROXICODONE) 5 MG immediate release tablet Take 2 tablets (10 mg total) by mouth every 4 (four) hours as needed for moderate pain. 07/17/16   Epifanio Lesches, MD    pantoprazole (PROTONIX) 40 MG tablet Take 1 tablet (40 mg total) by mouth 2 (two) times daily before a meal. 05/15/16   Epifanio Lesches, MD  rifaximin (XIFAXAN) 550 MG TABS tablet Take 1 tablet (550 mg total) by mouth 2 (two) times daily. 05/15/16   Epifanio Lesches, MD  spironolactone (ALDACTONE) 25 MG tablet Take 1 tablet (25 mg total) by mouth daily. 05/15/16   Epifanio Lesches, MD  tiZANidine (ZANAFLEX) 4 MG tablet Take 1 tablet (4 mg total) by mouth 3 (three) times daily as needed for muscle spasms. 05/15/16   Epifanio Lesches, MD    Allergies Hydrocodone; Aspirin; Erythromycin; Prednisone; Rosiglitazone maleate; Codeine sulfate; and Tetanus-diphtheria toxoids td  Family History  Problem Relation Age of Onset  . Hypertension Mother   . CAD Sister   . Heart attack Sister     Deceased November 26, 2014  . CAD Brother     Social History Social History  Substance Use Topics  . Smoking status: Never Smoker  . Smokeless tobacco: Never Used  . Alcohol use No    Review of Systems Constitutional: No fever/chills Eyes: No visual changes. ENT: No sore throat. Cardiovascular: Denies chest pain. Respiratory: Denies shortness of breath. Gastrointestinal:  No constipation. Genitourinary: Negative for dysuria. Musculoskeletal: Negative for back pain. Skin: Negative for rash. Neurological: Negative for headaches, focal weakness or numbness.  10-point ROS otherwise negative.  ____________________________________________   PHYSICAL EXAM:  VITAL SIGNS: ED Triage Vitals  Enc Vitals Group     BP 08/06/16 1136 (!) 143/90     Pulse Rate 08/06/16 1136 (!) 142     Resp 08/06/16 1136 16     Temp 08/06/16 1136 99.1 F (37.3 C)     Temp Source 08/06/16 1136 Oral     SpO2 08/06/16 1136 100 %     Weight 08/06/16 1120 123 lb (55.8 kg)     Height 08/06/16 1120 5' 3"  (1.6 m)     Head Circumference --      Peak Flow --      Pain Score 08/06/16 1123 0     Pain Loc --      Pain Edu? --       Excl. in [place]? --     Constitutional: Alert and oriented. Well appearing and in no acute distress. Eyes: Conjunctivae are normal. PERRL. EOMI. Head: Atraumatic. Nose: No congestion/rhinnorhea. Mouth/Throat: Mucous membranes are moist.   Neck: No stridor.   Cardiovascular: Normal rate, regular rhythm. Grossly normal heart sounds.  Good peripheral circulation. Respiratory: Normal respiratory effort.  No retractions. Lungs CTAB. Gastrointestinal: Soft and nontender. No distention. No abdominal bruits.  No CVA tenderness. Musculoskeletal: No lower extremity tenderness nor edema.  No joint effusions. Neurologic:  Normal speech and language. No gross focal neurologic deficits are appreciated. No gait instability. Skin:  Skin is warm, dry and intact. No rash noted. Psychiatric: Mood and affect are normal. Speech and behavior are normal.  ____________________________________________   LABS (all labs ordered are listed, but only abnormal results are displayed)  Labs Reviewed  GLUCOSE, CAPILLARY - Abnormal; Notable for the following:       Result Value   Glucose-Capillary 499 (*)    All other components within normal limits  CBC WITH DIFFERENTIAL/PLATELET - Abnormal; Notable for the following:    Hemoglobin 11.1 (*)    HCT 34.7 (*)    MCV 76.7 (*)    MCH 24.5 (*)    RDW 20.2 (*)    Neutro Abs 8.4 (*)    All other components within normal limits  COMPREHENSIVE METABOLIC PANEL  LIPASE, BLOOD  TROPONIN I  URINALYSIS COMPLETEWITH MICROSCOPIC ([facility] ONLY)   ____________________________________________  EKG  ED ECG REPORT I, Doran Stabler, the attending physician, personally viewed and interpreted this ECG.  ED ECG REPORT I, Doran Stabler, the attending physician, personally viewed and interpreted this ECG.   Date: 08/06/2016  EKG Time: 1123  Rate: 145  Rhythm: sinus tachycardia  Axis: Normal  Intervals:none  ST&T Change: No ST segment elevation. Mild ST depressions  in V4 and V5 and V6 as well as 3 and aVF. Likely rate related.  ____________________________________________  RADIOLOGY  DG Chest 2 View (Accession [id number]) (Order [id number])  Imaging  Date: 07/09/2016 Department: [facility] GENERAL SURGERY Released By: Aundria Mems, RN (auto-released) Authorizing: Paulette Blanch, MD  Exam Information   Status Exam Begun  Exam Ended   Final [99] 07/09/2016 1:54 AM 07/09/2016 2:04 AM  PACS Images   Show images for DG Chest 2 View  Study Result   CLINICAL DATA:  Upper abdominal pain for 3 or 4 days. Mid left chest pain since yesterday. Dyspnea onset today.  EXAM: CHEST  2 VIEW  COMPARISON:  01/28/2016  FINDINGS: The lungs are clear. The pulmonary vasculature is normal. Heart size is normal. Hilar and mediastinal contours are unremarkable. There is no pleural effusion.  IMPRESSION: No active cardiopulmonary disease.   Electronically Signed   By: Andreas Newport M.D.   On: 07/09/2016 02:27    ____________________________________________   PROCEDURES  Procedure(s) performed:   Procedures  Critical Care performed:   ____________________________________________   INITIAL IMPRESSION / ASSESSMENT AND PLAN / ED COURSE  Pertinent labs & imaging results that were available during my care of the patient were reviewed by me and considered in my medical decision making (see chart for details).   Clinical Course   ----------------------------------------- 12:27 PM on 08/06/2016 -----------------------------------------  Patient has chronic history of similar pain as well as GI complaints. Per waiting for labs though the patient yelled out in pain. 4 mg of morphine were ordered. Still awaiting electrolytes.  ----------------------------------------- 3:45 PM on 08/06/2016 -----------------------------------------  Patient resting without any distress at this time but persistently tachycardic. No  vomiting in the emergency department. Interval development of what appears to be duodenitis. We will admit to the hospital at this time for persistent tachycardia as well as abdominal pain. Patient aware of need for admission the hospital. Signed out to Dr. Anselm Jungling. Slightly elevated troponin likely secondary to demand. Patient without any chest pain or shortness of breath.  Less likely to be pulmonary cause such as PE. ____________________________________________   FINAL CLINICAL IMPRESSION(S) / ED DIAGNOSES  Final diagnoses:  Left upper quadrant pain   Intractable abdominal pain with tachycardia. Nausea vomiting diarrhea.   NEW MEDICATIONS STARTED DURING THIS VISIT:  New Prescriptions   No medications on file     Note:  This document was prepared using Dragon voice recognition software and may include unintentional dictation errors.    Orbie Pyo, MD 08/06/16 [phone]

## 2016-08-06 NOTE — ED Notes (Signed)
Pt calling out for pain medicine stating pain is "unbearable", Shannin made aware.

## 2016-08-06 NOTE — ED Notes (Signed)
Stuck pt to collect green tub,stick unsuccessful. Had medic attempt to draw and was also unsuccessful. Lab notified for someone to come and draw green tube.

## 2016-08-06 NOTE — H&P (Signed)
[facility] [place] [facility] NAME: [name]    MR#:  [id number]  DATE OF BIRTH:  [birth year]  DATE OF ADMISSION:  08/06/2016  PRIMARY CARE PHYSICIAN: No primary care provider on file.   REQUESTING/REFERRING PHYSICIAN: [place]  CHIEF COMPLAINT:   Chief Complaint  Patient presents with  . Abdominal Pain    HISTORY OF PRESENT ILLNESS: [name]  is a 60 y.o. female with a known history of Multiple medical issues and recurrent admissions for nausea or vomiting and abdominal pain. She was in emergency room 3 days ago for abdominal pain and nausea and vomiting complaint was sent home after giving IV fluids. She had an admission to hospital 3 weeks ago with the ureteral stent placement and urologic procedure for renal stone and was given antibiotic for 5 days after that. Today she came back to emergency room with complaint of having nausea vomiting and abdominal pain for last 5 days which did not get relieved even after visiting emergency room 3 days ago. Complains of having diarrhea or loose and watery 10 times a day for last few days. Denies any fever or chills. She is not able to eat or drink enough for last few days. In ER she is noted to have high blood sugar level and tachycardia up to 140. CT scan of the abdomen was done it also showed thickening of the duodenal wall possible duodenitis or ulcer disease. Concerned with this she is given his admission to hospitalist team and given IV fluids.  PAST MEDICAL HISTORY:   Past Medical History:  Diagnosis Date  . Allergy   . Anxiety   . Cancer (HCC)    HX OF CANCER OF UTERUS   . Cirrhosis of liver not due to alcohol ([place]) 2016  . Degenerative disk disease   . Diverticulitis   . Gastroparesis   . GERD (gastroesophageal reflux disease)   . History of hiatal hernia   . Hypertension   . Hypothyroid   . Intussusception intestine ([place]) 05/2015  . PAF (paroxysmal atrial fibrillation) ([place]) 03/2015    a. new onset 03/2015 in setting of intractable N/V; b. on Eliquis 5 mg bid; c. CHADSVASc 4 (DM, TIA x 2, female)  . Pancreatitis   . Sick sinus syndrome ([place])   . Stomach ulcer   . Stroke [facility])    with minimal left sided weakness  . Syncope 01/2015  . TIA (transient ischemic attack) 02/2015  . Type 1 diabetes ([place])    on levemir    PAST SURGICAL HISTORY: Past Surgical History:  Procedure Laterality Date  . ABDOMINAL HYSTERECTOMY    . CARDIAC CATHETERIZATION N/A 01/12/2016   Procedure: Left Heart Cath and Coronary Angiography;  Surgeon: Wellington Hampshire, MD;  Location: [place] [facility] LAB;  Service: Cardiovascular;  Laterality: N/A;  . CHOLECYSTECTOMY    . CYSTOSCOPY/URETEROSCOPY/HOLMIUM LASER Right 07/14/2016   Procedure: CYSTOSCOPY/URETEROSCOPY/HOLMIUM LASER;  Surgeon: Alexis Frock, MD;  Location: [facility] ORS;  Service: Urology;  Laterality: Right;  . ESOPHAGOGASTRODUODENOSCOPY N/A 04/04/2015   Procedure: ESOPHAGOGASTRODUODENOSCOPY (EGD);  Surgeon: Hulen Luster, MD;  Location: [facility] ENDOSCOPY;  Service: Endoscopy;  Laterality: N/A;  . ESOPHAGOGASTRODUODENOSCOPY (EGD) WITH PROPOFOL N/A 01/18/2016   Procedure: ESOPHAGOGASTRODUODENOSCOPY (EGD) WITH PROPOFOL;  Surgeon: Lucilla Lame, MD;  Location: [facility] ENDOSCOPY;  Service: Endoscopy;  Laterality: N/A;  . FLEXIBLE SIGMOIDOSCOPY N/A 01/18/2016   Procedure: FLEXIBLE SIGMOIDOSCOPY;  Surgeon: Lucilla Lame, MD;  Location: [facility] ENDOSCOPY;  Service: Endoscopy;  Laterality: N/A;  .  HERNIA REPAIR      SOCIAL HISTORY:  Social History  Substance Use Topics  . Smoking status: Never Smoker  . Smokeless tobacco: Never Used  . Alcohol use No    FAMILY HISTORY:  Family History  Problem Relation Age of Onset  . Hypertension Mother   . CAD Sister   . Heart attack Sister     Deceased Nov 15, 2014  . CAD Brother     DRUG ALLERGIES:  Allergies  Allergen Reactions  . Hydrocodone Other (See Comments)    Pt states that this medication caused cirrhosis of the  liver.    . Aspirin   . Erythromycin Other (See Comments)    Reaction:  Fever   . Prednisone Other (See Comments)    Reaction:  Unknown   . Rosiglitazone Maleate Swelling  . Codeine Sulfate Rash  . Tetanus-Diphtheria Toxoids Td Rash and Other (See Comments)    Reaction:  Fever     REVIEW OF SYSTEMS:   CONSTITUTIONAL: No fever, fatigue or weakness.  EYES: No blurred or double vision.  EARS, NOSE, AND THROAT: No tinnitus or ear pain.  RESPIRATORY: No cough, shortness of breath, wheezing or hemoptysis.  CARDIOVASCULAR: No chest pain, orthopnea, edema.  GASTROINTESTINAL: positive for nausea, vomiting, diarrhea or abdominal pain.  GENITOURINARY: No dysuria, hematuria.  ENDOCRINE: No polyuria, nocturia,  HEMATOLOGY: No anemia, easy bruising or bleeding SKIN: No rash or lesion. MUSCULOSKELETAL: No joint pain or arthritis.   NEUROLOGIC: No tingling, numbness, weakness.  PSYCHIATRY: No anxiety or depression.   MEDICATIONS AT HOME:  Prior to Admission medications   Medication Sig Start Date End Date Taking? Authorizing Provider  acidophilus (RISAQUAD) CAPS capsule Take 2 capsules by mouth daily. 01/25/16  Yes Sital Mody, MD  albuterol (PROVENTIL HFA;VENTOLIN HFA) 108 (90 Base) MCG/ACT inhaler Inhale 2 puffs into the lungs every 6 (six) hours as needed for wheezing or shortness of breath. 05/15/16  Yes Epifanio Lesches, MD  atorvastatin (LIPITOR) 40 MG tablet Take 1 tablet (40 mg total) by mouth at bedtime. 05/15/16  Yes Epifanio Lesches, MD  bismuth subsalicylate (PEPTO BISMOL) 262 MG/15ML suspension Take 30 mLs by mouth every 4 (four) hours as needed for diarrhea or loose stools. 01/25/16  Yes Bettey Costa, MD  budesonide-formoterol (SYMBICORT) 160-4.5 MCG/ACT inhaler Inhale 2 puffs into the lungs 2 (two) times daily. 05/15/16  Yes Epifanio Lesches, MD  cephALEXin (KEFLEX) 500 MG capsule Take 1 capsule (500 mg total) by mouth every 12 (twelve) hours. 07/17/16  Yes Epifanio Lesches, MD   diphenhydrAMINE (BENADRYL) 25 mg capsule Take 2 capsules (50 mg total) by mouth every 6 (six) hours as needed. Patient taking differently: Take 50 mg by mouth every 6 (six) hours as needed for itching or allergies.  02/09/16  Yes Carrie Mew, MD  feeding supplement, GLUCERNA SHAKE, (GLUCERNA SHAKE) LIQD Take 237 mLs by mouth 3 (three) times daily between meals. 05/15/16  Yes Epifanio Lesches, MD  insulin aspart (NOVOLOG) 100 UNIT/ML injection Inject 5 Units into the skin 3 (three) times daily with meals as needed for high blood sugar. Pt uses as needed per sliding scale. 05/15/16  Yes Epifanio Lesches, MD  insulin detemir (LEVEMIR) 100 UNIT/ML injection Inject 0.07 mLs (7 Units total) into the skin at bedtime. 07/17/16  Yes Epifanio Lesches, MD  levothyroxine (SYNTHROID, LEVOTHROID) 75 MCG tablet Take 1 tablet (75 mcg total) by mouth daily before breakfast. 05/15/16  Yes Epifanio Lesches, MD  metoCLOPramide (REGLAN) 10 MG tablet Take 1 tablet (10 mg  total) by mouth 4 (four) times daily -  before meals and at bedtime. 05/15/16  Yes Epifanio Lesches, MD  metoprolol tartrate (LOPRESSOR) 25 MG tablet Take 1 tablet (25 mg total) by mouth 2 (two) times daily. 07/10/16  Yes Fritzi Mandes, MD  morphine (MS CONTIN) 30 MG 12 hr tablet Take 1 tablet (30 mg total) by mouth every 12 (twelve) hours. 07/17/16 08/16/16 Yes Epifanio Lesches, MD  ondansetron (ZOFRAN ODT) 4 MG disintegrating tablet Take 1 tablet (4 mg total) by mouth every 8 (eight) hours as needed for nausea or vomiting. 05/15/16  Yes Epifanio Lesches, MD  oxyCODONE (OXY IR/ROXICODONE) 5 MG immediate release tablet Take 2 tablets (10 mg total) by mouth every 4 (four) hours as needed for moderate pain. 07/17/16  Yes Epifanio Lesches, MD  pantoprazole (PROTONIX) 40 MG tablet Take 1 tablet (40 mg total) by mouth 2 (two) times daily before a meal. 05/15/16  Yes Epifanio Lesches, MD  spironolactone (ALDACTONE) 25 MG tablet Take 1 tablet (25 mg  total) by mouth daily. 05/15/16  Yes Epifanio Lesches, MD  tiZANidine (ZANAFLEX) 4 MG tablet Take 1 tablet (4 mg total) by mouth 3 (three) times daily as needed for muscle spasms. 05/15/16  Yes Epifanio Lesches, MD  rifaximin (XIFAXAN) 550 MG TABS tablet Take 1 tablet (550 mg total) by mouth 2 (two) times daily. Patient not taking: Reported on 08/06/2016 05/15/16   Epifanio Lesches, MD      PHYSICAL EXAMINATION:   VITAL SIGNS: Blood pressure (!) 175/108, pulse (!) 136, temperature 99.1 F (37.3 C), temperature source Oral, resp. rate 17, height 5' 3"  (1.6 m), weight 55.8 kg (123 lb), SpO2 99 %.  GENERAL:  60 y.o.-year-old patient lying in the bed with no acute distress.  EYES: Pupils equal, round, reactive to light and accommodation. No scleral icterus. Extraocular muscles intact.  HEENT: Head atraumatic, normocephalic. Oropharynx and nasopharynx clear.  NECK:  Supple, no jugular venous distention. No thyroid enlargement, no tenderness.  LUNGS: Normal breath sounds bilaterally, no wheezing, rales,rhonchi or crepitation. No use of accessory muscles of respiration.  CARDIOVASCULAR: S1, S2 fast and irregular. No murmurs, rubs, or gallops.  ABDOMEN: Soft, epigastric tender, nondistended. Bowel sounds present. No organomegaly or mass.  EXTREMITIES: No pedal edema, cyanosis, or clubbing.  NEUROLOGIC: Cranial nerves II through XII are intact. Muscle strength 5/5 in all extremities. Sensation intact. Gait not checked.  PSYCHIATRIC: The patient is alert and oriented x 3.  SKIN: No obvious rash, lesion, or ulcer.   LABORATORY PANEL:   CBC  Recent Labs Lab 08/03/16 1654 08/06/16 1144  WBC 7.2 10.6  HGB 11.3* 11.1*  HCT 34.5* 34.7*  PLT 209 189  MCV 77.0* 76.7*  MCH 25.1* 24.5*  MCHC 32.6 32.0  RDW 19.6* 20.2*  LYMPHSABS 0.5* 1.2  MONOABS 0.2 0.9  EOSABS 0.0 0.1  BASOSABS 0.0 0.0    ------------------------------------------------------------------------------------------------------------------  Chemistries   Recent Labs Lab 08/03/16 1654 08/06/16 1416  NA 136 133*  K 3.2* 3.3*  CL 105 105  CO2 19* 20*  GLUCOSE 310* 417*  BUN 22* 25*  CREATININE 0.80 0.86  CALCIUM 9.2 8.6*  AST 23 17  ALT 18 16  ALKPHOS 100 93  BILITOT 0.9 1.1   ------------------------------------------------------------------------------------------------------------------ estimated creatinine clearance is 56.1 mL/min (by C-G formula based on SCr of 0.86 mg/dL). ------------------------------------------------------------------------------------------------------------------ No results for input(s): TSH, T4TOTAL, T3FREE, THYROIDAB in the last 72 hours.  Invalid input(s): FREET3   Coagulation profile No results for input(s): INR, PROTIME  in the last 168 hours. ------------------------------------------------------------------------------------------------------------------- No results for input(s): DDIMER in the last 72 hours. -------------------------------------------------------------------------------------------------------------------  Cardiac Enzymes  Recent Labs Lab 08/06/16 1144  TROPONINI 0.03*   ------------------------------------------------------------------------------------------------------------------ Invalid input(s): POCBNP  ---------------------------------------------------------------------------------------------------------------  Urinalysis    Component Value Date/Time   COLORURINE AMBER (A) 08/03/2016 1832   APPEARANCEUR CLOUDY (A) 08/03/2016 1832   APPEARANCEUR Clear 10/08/2014 0144   LABSPEC 1.030 08/03/2016 1832   LABSPEC 1.033 10/08/2014 0144   PHURINE 5.0 08/03/2016 1832   GLUCOSEU >500 (A) 08/03/2016 1832   GLUCOSEU >=500 10/08/2014 0144   HGBUR 3+ (A) 08/03/2016 1832   BILIRUBINUR NEGATIVE 08/03/2016 1832   BILIRUBINUR Negative  10/08/2014 0144   KETONESUR 1+ (A) 08/03/2016 1832   PROTEINUR 100 (A) 08/03/2016 1832   UROBILINOGEN 0.2 05/27/2015 1936   NITRITE NEGATIVE 08/03/2016 1832   LEUKOCYTESUR 1+ (A) 08/03/2016 1832   LEUKOCYTESUR Negative 10/08/2014 0144     RADIOLOGY: Dg Chest 1 View  Result Date: 08/06/2016 CLINICAL DATA:  Abdominal pain. EXAM: CHEST 1 VIEW COMPARISON:  07/09/2016. FINDINGS: Mediastinum hilar structures normal. Lungs are clear. Cardiomegaly with normal pulmonary vascularity. IMPRESSION: 1. Cardiomegaly.  No evidence of pulmonary venous congestion. 2. No acute pulmonary disease. Electronically Signed   By: Marcello Moores  Register   On: 08/06/2016 12:41   Ct Renal Stone Study  Result Date: 08/06/2016 CLINICAL DATA:  C/o abd pain with n/v x 5 days, hx of cholecystectomy and hysterectomy. EXAM: CT ABDOMEN AND PELVIS WITHOUT CONTRAST TECHNIQUE: Multidetector CT imaging of the abdomen and pelvis was performed following the standard protocol without IV contrast. COMPARISON:  07/13/2016 FINDINGS: Lower chest: No pulmonary nodules, pleural effusions, or infiltrates. Heart size is normal. No imaged pericardial effusion or significant coronary artery calcifications. Hepatobiliary: Nodular contour of the liver. No focal liver lesions identified on this noncontrast exam. Status post cholecystectomy. Recanalized umbilical vein. Pancreas: No focal pancreatic lesion identified. Spleen: Normal in size without focal abnormality. Adrenals/Urinary Tract: Mildly nodular adrenal glands bilaterally. The right ureter contains a double-J stent, upper portion within the region of the renal pelvis in the lower portion within the urinary bladder. No definite ureteral stones are identified. Small intrarenal calculi are identified in the lower pole of the kidneys bilaterally. Small punctate calcifications are identified in the midpole left kidney. No hydronephrosis. Stomach/Bowel: There is dilatation and wall thickening of the  duodenal bulb, descending, and horizontal portion of the duodenum, most consistent with duodenitis. This is a change since prior study. There is surrounding stranding. No evidence for perforation. Small mesenteric lymph nodes are also present in may be reactive. Remainder of the small bowel including the terminal ileal loops are normal in appearance. There scattered colonic diverticula but no acute diverticulitis. The appendix is not well seen. Vascular/Lymphatic: There is atherosclerotic calcification of the abdominal aorta. No aneurysm. No significant retroperitoneal adenopathy. Reproductive: Status post hysterectomy.  No adnexal mass. Other: None Musculoskeletal: Scoliosis and associated degenerative changes. IMPRESSION: 1. Interval development of dilatation and wall thickening of the duodenum most consistent with duodenitis. Peptic ulcer disease and lymphoma can have a similar appearance but are felt to be less likely. 2. Cirrhosis and portal venous hypertension. 3. Right ureteral stent. 4. Nonobstructing bilateral intrarenal calculi. 5. Colonic diverticulosis. 6. Status post hysterectomy. 7. Scoliosis and degenerative changes in the spine. Electronically Signed   By: Nolon Nations M.D.   On: 08/06/2016 12:55    EKG: Orders placed or performed during the hospital encounter of 08/06/16  . ED EKG  . EKG  12-Lead  . EKG 12-Lead  . ED EKG    IMPRESSION AND PLAN:  * Intractable nausea and vomiting and abdominal pain with diarrhea.   Patient has this chronic and the current complaint.   Differentials include duodenitis, diabetic gastroparesis.   IV fluid and supportive therapy for now.   Check stool for C. difficile and get GI consult.  * Atrial fibrillation with rapid ventricular response.   This is secondary to dehydration and nausea and abdominal pain.   We will continue IV fluid and continue to follow. Continue her rate controlling oral medication.  *  Cirrhosis: Minimal ascites; continue  rifaximin, Lasix and spironolactone. SBP unlikely at this time. *  Diabetes mellitus: Continue basal insulin as well as sliding scale. *  Atrial fibrillation: Paroxysmal; metoprolol for rate control. Continue Eliquis *  Hypothyroidism: Continue Synthroid.  *  Hyperlipidemia: Continue statin therapy *  Asthma: Controlled; Continue inhaled corticosteroid. Albuterol as needed   All the records are reviewed and case discussed with ED provider. Management plans discussed with the patient, family and they are in agreement.  CODE STATUS: Code Status History    Date Active Date Inactive Code Status Order ID Comments User Context   07/14/2016  4:26 AM 07/17/2016  7:34 PM Full Code [id number]  Harrie Foreman, MD Inpatient   07/09/2016  4:53 AM 07/10/2016  5:08 PM Full Code [id number]  Lance Coon, MD Inpatient   06/17/2016 10:45 PM 06/20/2016  3:09 PM Full Code [id number]  Lance Coon, MD Inpatient   06/02/2016  5:46 AM 06/06/2016  6:21 PM DNR [id number]  Harrie Foreman, MD Inpatient   05/12/2016 11:43 AM 05/16/2016  4:38 PM DNR [id number]  Dustin Flock, MD Inpatient   05/12/2016  1:13 AM 05/12/2016 11:43 AM Full Code [id number]  Harvie Bridge, DO Inpatient   04/07/2016  6:18 AM 04/09/2016  2:19 PM DNR [id number]  Harrie Foreman, MD Inpatient   03/13/2016  9:25 PM 03/16/2016  6:32 PM DNR [id number]  Lance Coon, MD Inpatient   02/04/2016  9:39 AM 02/07/2016  2:59 PM DNR [id number]  Saundra Shelling, MD Inpatient   01/21/2016 10:24 PM 01/25/2016  5:02 PM DNR [id number]  Lance Coon, MD Inpatient   01/15/2016  1:39 PM 01/19/2016  3:29 PM DNR [id number]  Gladstone Lighter, MD ED   01/10/2016 10:00 PM 01/13/2016  5:24 PM DNR [id number]  Harrie Foreman, MD Inpatient   12/27/2015 11:02 PM 01/01/2016  6:17 PM DNR [id number]  Max Sane, MD Inpatient   12/16/2015  4:54 PM 12/19/2015  4:52 PM DNR [id number]  Loletha Grayer, MD ED   11/11/2015 11:36 AM 11/16/2015  2:05 PM Full Code [id number]  Samella Parr, NP Inpatient   10/15/2015  1:23  AM 10/16/2015  6:17 PM Full Code [id number]  Lance Coon, MD Inpatient   10/04/2015  8:19 PM 10/08/2015  6:34 PM Full Code [id number]  Bethena Roys, MD Inpatient   08/15/2015  1:05 AM 08/16/2015  8:04 PM Full Code [id number]  Harrie Foreman, MD Inpatient   08/01/2015  1:25 AM 08/06/2015  5:41 PM Full Code [id number]  Harrie Foreman, MD Inpatient   07/04/2015  2:53 AM 07/11/2015  6:33 PM Full Code [id number]  Lytle Butte, MD ED   05/27/2015 10:48 PM 05/31/2015  2:37 PM Full Code [id number]  Rise Patience, MD Inpatient   05/19/2015  1:14 AM 05/22/2015  4:28 PM Full Code [id number]  Rise Patience, MD Inpatient   04/05/2015  9:16 AM 04/07/2015  5:23 PM Full Code [id number]  Bettey Costa, MD Inpatient   04/03/2015 12:31 PM 04/05/2015  9:16 AM Full Code [id number]  Bettey Costa, MD Inpatient   03/11/2015  4:49 PM 03/14/2015  3:50 PM Full Code [id number]  Dustin Flock, MD Inpatient   02/15/2015  4:51 PM 02/17/2015  2:33 PM DNR [id number]  Dustin Flock, MD Inpatient   02/15/2015  4:35 PM 02/15/2015  4:51 PM Full Code [id number]  Dustin Flock, MD Inpatient   01/11/2015  2:40 AM 01/12/2015  6:11 PM Full Code [id number]  Etta Quill, DO ED    Advance Directive Documentation   Flowsheet Row Most Recent Value  Type of Advance Directive  Living will, Healthcare Power of Attorney  Pre-existing out of facility DNR order (yellow form or pink MOST form)  No data  "MOST" Form in Place?  No data       TOTAL TIME TAKING CARE OF THIS PATIENT: 45 minutes.    Vaughan Basta M.D on 08/06/2016   Between 7am to 6pm - Pager - [phone]  After 6pm go to [email or web address] - password [email or web address] [place] [facility]  Office  [phone]  CC: Primary care physician; No primary care provider on file.   Note: This dictation was prepared with Dragon dictation along with smaller phrase technology. Any transcriptional errors that result from this process are unintentional.

## 2016-08-06 NOTE — ED Triage Notes (Signed)
Pt presents to ED from home c/o abd pain with n/v x5 days. EMS state glucose 551. Pt alert and oriented x4 on arrival

## 2016-08-06 NOTE — ED Notes (Signed)
Admitting ED was notified of increased heart rate and BP; no new orders

## 2016-08-06 NOTE — ED Notes (Signed)
Lab notified to send someone to recollect green top.

## 2016-08-07 DIAGNOSIS — R112 Nausea with vomiting, unspecified: Secondary | ICD-10-CM

## 2016-08-07 DIAGNOSIS — K3184 Gastroparesis: Secondary | ICD-10-CM

## 2016-08-07 DIAGNOSIS — E119 Type 2 diabetes mellitus without complications: Secondary | ICD-10-CM | POA: Diagnosis not present

## 2016-08-07 DIAGNOSIS — I4891 Unspecified atrial fibrillation: Secondary | ICD-10-CM | POA: Diagnosis not present

## 2016-08-07 DIAGNOSIS — K7460 Unspecified cirrhosis of liver: Secondary | ICD-10-CM | POA: Diagnosis not present

## 2016-08-07 LAB — GLUCOSE, CAPILLARY
GLUCOSE-CAPILLARY: 220 mg/dL — AB (ref 65–99)
GLUCOSE-CAPILLARY: 167 mg/dL — AB (ref 65–99)
GLUCOSE-CAPILLARY: 218 mg/dL — AB (ref 65–99)
Glucose-Capillary: 133 mg/dL — ABNORMAL HIGH (ref 65–99)

## 2016-08-07 LAB — CBC
HEMATOCRIT: 30.5 % — AB (ref 35.0–47.0)
HEMOGLOBIN: 9.8 g/dL — AB (ref 12.0–16.0)
MCH: 24.6 pg — ABNORMAL LOW (ref 26.0–34.0)
MCHC: 32.1 g/dL (ref 32.0–36.0)
MCV: 76.6 fL — ABNORMAL LOW (ref 80.0–100.0)
Platelets: 152 10*3/uL (ref 150–440)
RBC: 3.98 MIL/uL (ref 3.80–5.20)
RDW: 20.1 % — ABNORMAL HIGH (ref 11.5–14.5)
WBC: 9.4 10*3/uL (ref 3.6–11.0)

## 2016-08-07 LAB — BASIC METABOLIC PANEL
ANION GAP: 6 (ref 5–15)
BUN: 26 mg/dL — ABNORMAL HIGH (ref 6–20)
CO2: 20 mmol/L — ABNORMAL LOW (ref 22–32)
Calcium: 8.1 mg/dL — ABNORMAL LOW (ref 8.9–10.3)
Chloride: 110 mmol/L (ref 101–111)
Creatinine, Ser: 0.82 mg/dL (ref 0.44–1.00)
GFR calc Af Amer: >60 mL/min (ref >60)
GLUCOSE: 218 mg/dL — AB (ref 65–99)
POTASSIUM: 3 mmol/L — AB (ref 3.5–5.1)
SODIUM: 136 mmol/L (ref 135–145)

## 2016-08-07 MED ORDER — POTASSIUM CHLORIDE CRYS ER 20 MEQ PO TBCR
40.0000 meq | EXTENDED_RELEASE_TABLET | ORAL | Status: AC
  Administered 2016-08-07 (×2): 40 meq via ORAL
  Filled 2016-08-07 (×2): qty 2

## 2016-08-07 MED ORDER — METOPROLOL TARTRATE 50 MG PO TABS
50.0000 mg | ORAL_TABLET | Freq: Two times a day (BID) | ORAL | Status: DC
  Administered 2016-08-07 – 2016-08-09 (×4): 50 mg via ORAL
  Filled 2016-08-07 (×4): qty 1

## 2016-08-07 MED ORDER — ONDANSETRON HCL 4 MG/2ML IJ SOLN
4.0000 mg | Freq: Four times a day (QID) | INTRAMUSCULAR | Status: DC | PRN
  Administered 2016-08-07 – 2016-08-08 (×4): 4 mg via INTRAVENOUS
  Filled 2016-08-07 (×4): qty 2

## 2016-08-07 MED ORDER — INSULIN DETEMIR 100 UNIT/ML ~~LOC~~ SOLN
7.0000 [IU] | Freq: Every day | SUBCUTANEOUS | Status: DC
  Administered 2016-08-07 – 2016-08-09 (×3): 7 [IU] via SUBCUTANEOUS
  Filled 2016-08-07 (×4): qty 0.07

## 2016-08-07 NOTE — Progress Notes (Signed)
Inpatient Diabetes Program Recommendations  AACE/ADA: New Consensus Statement on Inpatient Glycemic Control (2015)  Target Ranges:  Prepandial:   less than 140 mg/dL      Peak postprandial:   less than 180 mg/dL (1-2 hours)      Critically ill patients:  140 - 180 mg/dL   Results for [name], [name] (MRN [id number]) as of 08/07/2016 09:37  Ref. Range 08/06/2016 11:22 08/06/2016 18:54 08/06/2016 22:13 08/07/2016 07:43  Glucose-Capillary Latest Ref Range: 65 - 99 mg/dL 499 (H) 337 (H) 334 (H) 220 (H)    Admit with: Abd Pain with N&V  History: DM  Home DM Meds: Levemir 7 units QHS       Novolog 5 units TID per SSI  Current Insulin Orders: Levemir 7 units QHS      Novolog Sensitive Correction Scale/ SSI (0-9 units) TID AC + HS        MD- Please consider starting Levemir today.  Not scheduled to get dose until tonight and according to Home Med Rec last dose given was at 10:45am on 11/27.     --Will follow patient during hospitalization--  Wyn Quaker RN, MSN, CDE Diabetes Coordinator Inpatient Glycemic Control Team Team Pager: [phone] (8a-5p)

## 2016-08-07 NOTE — Consult Note (Signed)
Jonathon Bellows MD  [address] [place], [place] [address] Phone: [phone] Fax : [phone]  Consultation  Referring Provider:     No ref. provider found Primary Care Physician:  No primary care provider on file. Primary Gastroenterologist:  Dr. Vicente Males         Reason for Consultation:     Nausea and Vomiting    Date of Admission  :  08/06/2016 Date of Consultation:  08/07/2016         HPI:   [name] is a 60 y.o. female with history of non alcoholic cirrhosis , gastroparesis . Was found to have a renal stone on admission on a CT scan 07/14/16 . At that time she also had nausea/vomiting and hence was admitted. She has had multiple admissions in the past for recurrent nausea/vomiting . She returned to the ER on 08/06/16 for nausea/vomiting and abdomina pain /. She also complained of diarrhea . At the Er was noted to have high blood sugar and Ct abdomen showed features suggestive of duodenitis.   CT abdomen 07/13/16 shows cirrhosis of the liver, portal portal vein, esophageal varices in distal esophagus   EGD 01/2016 showed gastritis, prior nissen's fundoplication as well as a suture was removed.   Hb 9.8, MCV 76, glucose on admission 417 , proteinuria   She says it all began 6 days back with central abdominal pain radiating to her back , followed by nausea/vomiting and the last 5 days has had diarrhea. Since last night has had no nausea/vomiting or diarrhea. She has kept her food down today . Some mild epigastric pain.   Denies any overt blood loss, last colonoscopy many years back. She does not follow up with GI as an outpatient and was unsure as to the cause of her liver cirrhosis.    Lipase     Component Value Date/Time   LIPASE 28 08/06/2016 1144   LIPASE 83 10/13/2014 0357     Past Medical History:  Diagnosis Date  . Allergy   . Anxiety   . Cancer (HCC)    HX OF CANCER OF UTERUS   . Cirrhosis of liver not due to alcohol ([place]) 2016  . Degenerative disk disease   .  Diverticulitis   . Gastroparesis   . GERD (gastroesophageal reflux disease)   . History of hiatal hernia   . Hypertension   . Hypothyroid   . Intussusception intestine ([place]) 05/2015  . PAF (paroxysmal atrial fibrillation) ([place]) 03/2015   a. new onset 03/2015 in setting of intractable N/V; b. on Eliquis 5 mg bid; c. CHADSVASc 4 (DM, TIA x 2, female)  . Pancreatitis   . Sick sinus syndrome ([place])   . Stomach ulcer   . Stroke [facility]    with minimal left sided weakness  . Syncope 01/2015  . TIA (transient ischemic attack) 02/2015  . Type 1 diabetes ([place])    on levemir    Past Surgical History:  Procedure Laterality Date  . ABDOMINAL HYSTERECTOMY    . CARDIAC CATHETERIZATION N/A 01/12/2016   Procedure: Left Heart Cath and Coronary Angiography;  Surgeon: Wellington Hampshire, MD;  Location: [facility];  Service: Cardiovascular;  Laterality: N/A;  . CHOLECYSTECTOMY    . CYSTOSCOPY/URETEROSCOPY/HOLMIUM LASER Right 07/14/2016   Procedure: CYSTOSCOPY/URETEROSCOPY/HOLMIUM LASER;  Surgeon: Alexis Frock, MD;  Location: [facility];  Service: Urology;  Laterality: Right;  . ESOPHAGOGASTRODUODENOSCOPY N/A 04/04/2015   Procedure: ESOPHAGOGASTRODUODENOSCOPY (EGD);  Surgeon: Hulen Luster, MD;  Location: [facility];  Service: Endoscopy;  Laterality: N/A;  . ESOPHAGOGASTRODUODENOSCOPY (EGD) WITH PROPOFOL N/A 01/18/2016   Procedure: ESOPHAGOGASTRODUODENOSCOPY (EGD) WITH PROPOFOL;  Surgeon: Lucilla Lame, MD;  Location: [facility] ENDOSCOPY;  Service: Endoscopy;  Laterality: N/A;  . FLEXIBLE SIGMOIDOSCOPY N/A 01/18/2016   Procedure: FLEXIBLE SIGMOIDOSCOPY;  Surgeon: Lucilla Lame, MD;  Location: [facility] ENDOSCOPY;  Service: Endoscopy;  Laterality: N/A;  . HERNIA REPAIR      Prior to Admission medications   Medication Sig Start Date End Date Taking? Authorizing Provider  acidophilus (RISAQUAD) CAPS capsule Take 2 capsules by mouth daily. 01/25/16  Yes Sital Mody, MD  albuterol (PROVENTIL HFA;VENTOLIN HFA) 108 (90  Base) MCG/ACT inhaler Inhale 2 puffs into the lungs every 6 (six) hours as needed for wheezing or shortness of breath. 05/15/16  Yes Epifanio Lesches, MD  atorvastatin (LIPITOR) 40 MG tablet Take 1 tablet (40 mg total) by mouth at bedtime. 05/15/16  Yes Epifanio Lesches, MD  bismuth subsalicylate (PEPTO BISMOL) 262 MG/15ML suspension Take 30 mLs by mouth every 4 (four) hours as needed for diarrhea or loose stools. 01/25/16  Yes Bettey Costa, MD  budesonide-formoterol (SYMBICORT) 160-4.5 MCG/ACT inhaler Inhale 2 puffs into the lungs 2 (two) times daily. 05/15/16  Yes Epifanio Lesches, MD  cephALEXin (KEFLEX) 500 MG capsule Take 1 capsule (500 mg total) by mouth every 12 (twelve) hours. 07/17/16  Yes Epifanio Lesches, MD  diphenhydrAMINE (BENADRYL) 25 mg capsule Take 2 capsules (50 mg total) by mouth every 6 (six) hours as needed. Patient taking differently: Take 50 mg by mouth every 6 (six) hours as needed for itching or allergies.  02/09/16  Yes Carrie Mew, MD  feeding supplement, GLUCERNA SHAKE, (GLUCERNA SHAKE) LIQD Take 237 mLs by mouth 3 (three) times daily between meals. 05/15/16  Yes Epifanio Lesches, MD  insulin aspart (NOVOLOG) 100 UNIT/ML injection Inject 5 Units into the skin 3 (three) times daily with meals as needed for high blood sugar. Pt uses as needed per sliding scale. 05/15/16  Yes Epifanio Lesches, MD  insulin detemir (LEVEMIR) 100 UNIT/ML injection Inject 0.07 mLs (7 Units total) into the skin at bedtime. 07/17/16  Yes Epifanio Lesches, MD  levothyroxine (SYNTHROID, LEVOTHROID) 75 MCG tablet Take 1 tablet (75 mcg total) by mouth daily before breakfast. 05/15/16  Yes Epifanio Lesches, MD  metoCLOPramide (REGLAN) 10 MG tablet Take 1 tablet (10 mg total) by mouth 4 (four) times daily -  before meals and at bedtime. 05/15/16  Yes Epifanio Lesches, MD  metoprolol tartrate (LOPRESSOR) 25 MG tablet Take 1 tablet (25 mg total) by mouth 2 (two) times daily. 07/10/16  Yes Fritzi Mandes, MD  morphine (MS CONTIN) 30 MG 12 hr tablet Take 1 tablet (30 mg total) by mouth every 12 (twelve) hours. 07/17/16 08/16/16 Yes Epifanio Lesches, MD  ondansetron (ZOFRAN ODT) 4 MG disintegrating tablet Take 1 tablet (4 mg total) by mouth every 8 (eight) hours as needed for nausea or vomiting. 05/15/16  Yes Epifanio Lesches, MD  oxyCODONE (OXY IR/ROXICODONE) 5 MG immediate release tablet Take 2 tablets (10 mg total) by mouth every 4 (four) hours as needed for moderate pain. 07/17/16  Yes Epifanio Lesches, MD  pantoprazole (PROTONIX) 40 MG tablet Take 1 tablet (40 mg total) by mouth 2 (two) times daily before a meal. 05/15/16  Yes Epifanio Lesches, MD  spironolactone (ALDACTONE) 25 MG tablet Take 1 tablet (25 mg total) by mouth daily. 05/15/16  Yes Epifanio Lesches, MD  tiZANidine (ZANAFLEX) 4 MG tablet Take 1 tablet (4 mg total) by  mouth 3 (three) times daily as needed for muscle spasms. 05/15/16  Yes Epifanio Lesches, MD  rifaximin (XIFAXAN) 550 MG TABS tablet Take 1 tablet (550 mg total) by mouth 2 (two) times daily. Patient not taking: Reported on 08/06/2016 05/15/16   Epifanio Lesches, MD    Family History  Problem Relation Age of Onset  . Hypertension Mother   . CAD Sister   . Heart attack Sister     Deceased 11-27-2014  . CAD Brother      Social History  Substance Use Topics  . Smoking status: Never Smoker  . Smokeless tobacco: Never Used  . Alcohol use No    Allergies as of 08/06/2016 - Review Complete 08/06/2016  Allergen Reaction Noted  . Hydrocodone Other (See Comments) 10/21/2015  . Aspirin  01/28/2016  . Erythromycin Other (See Comments) 12/16/2015  . Prednisone Other (See Comments) 10/04/2015  . Rosiglitazone maleate Swelling   . Codeine sulfate Rash   . Tetanus-diphtheria toxoids td Rash and Other (See Comments)     Review of Systems:    All systems reviewed and negative except where noted in HPI.   Physical Exam:  Vital signs in last 24 hours: Temp:   [97.6 F (36.4 C)-99.4 F (37.4 C)] 97.6 F (36.4 C) (11/28 0430) Pulse Rate:  [96-142] 114 (11/28 0746) Resp:  [16-23] 22 (11/28 0430) BP: (93-188)/(59-111) 125/62 (11/28 0746) SpO2:  [98 %-100 %] 99 % (11/28 0430) Weight:  [123 lb (55.8 kg)] 123 lb (55.8 kg) (11/27 1120) Last BM Date: 08/06/16 General:   Pleasant, cooperative in NAD, thin  Head:  Normocephalic and atraumatic. Eyes:   No icterus.   Conjunctiva pink. PERRLA. Ears:  Normal auditory acuity. Neck:  Supple; no masses or thyroidomegaly Lungs: Respirations even and unlabored. Lungs clear to auscultation bilaterally.   No wheezes, crackles, or rhonchi.  Heart:  Regular rate and rhythm;  Without murmur, clicks, rubs or gallops Abdomen:  Soft, nondistended, nontender. Normal bowel sounds. No appreciable masses or hepatomegaly.  No rebound or guarding.  Extremities:  Without edema, cyanosis or clubbing. Neurologic:  Alert and oriented x3;  grossly normal neurologically. Psych:  Alert and cooperative. Normal affect.  LAB RESULTS:  Recent Labs  08/06/16 1144 08/07/16 0434  WBC 10.6 9.4  HGB 11.1* 9.8*  HCT 34.7* 30.5*  PLT 189 152   BMET  Recent Labs  08/06/16 1416 08/07/16 0434  NA 133* 136  K 3.3* 3.0*  CL 105 110  CO2 20* 20*  GLUCOSE 417* 218*  BUN 25* 26*  CREATININE 0.86 0.82  CALCIUM 8.6* 8.1*   LFT  Recent Labs  08/06/16 1416  PROT 6.1*  ALBUMIN 3.4*  AST 17  ALT 16  ALKPHOS 93  BILITOT 1.1   PT/INR No results for input(s): LABPROT, INR in the last 72 hours.  STUDIES: Dg Chest 1 View  Result Date: 08/06/2016 CLINICAL DATA:  Abdominal pain. EXAM: CHEST 1 VIEW COMPARISON:  07/09/2016. FINDINGS: Mediastinum hilar structures normal. Lungs are clear. Cardiomegaly with normal pulmonary vascularity. IMPRESSION: 1. Cardiomegaly.  No evidence of pulmonary venous congestion. 2. No acute pulmonary disease. Electronically Signed   By: Marcello Moores  Register   On: 08/06/2016 12:41   Ct Renal Stone  Study  Result Date: 08/06/2016 CLINICAL DATA:  C/o abd pain with n/v x 5 days, hx of cholecystectomy and hysterectomy. EXAM: CT ABDOMEN AND PELVIS WITHOUT CONTRAST TECHNIQUE: Multidetector CT imaging of the abdomen and pelvis was performed following the standard protocol without IV contrast.  COMPARISON:  07/13/2016 FINDINGS: Lower chest: No pulmonary nodules, pleural effusions, or infiltrates. Heart size is normal. No imaged pericardial effusion or significant coronary artery calcifications. Hepatobiliary: Nodular contour of the liver. No focal liver lesions identified on this noncontrast exam. Status post cholecystectomy. Recanalized umbilical vein. Pancreas: No focal pancreatic lesion identified. Spleen: Normal in size without focal abnormality. Adrenals/Urinary Tract: Mildly nodular adrenal glands bilaterally. The right ureter contains a double-J stent, upper portion within the region of the renal pelvis in the lower portion within the urinary bladder. No definite ureteral stones are identified. Small intrarenal calculi are identified in the lower pole of the kidneys bilaterally. Small punctate calcifications are identified in the midpole left kidney. No hydronephrosis. Stomach/Bowel: There is dilatation and wall thickening of the duodenal bulb, descending, and horizontal portion of the duodenum, most consistent with duodenitis. This is a change since prior study. There is surrounding stranding. No evidence for perforation. Small mesenteric lymph nodes are also present in may be reactive. Remainder of the small bowel including the terminal ileal loops are normal in appearance. There scattered colonic diverticula but no acute diverticulitis. The appendix is not well seen. Vascular/Lymphatic: There is atherosclerotic calcification of the abdominal aorta. No aneurysm. No significant retroperitoneal adenopathy. Reproductive: Status post hysterectomy.  No adnexal mass. Other: None Musculoskeletal: Scoliosis and  associated degenerative changes. IMPRESSION: 1. Interval development of dilatation and wall thickening of the duodenum most consistent with duodenitis. Peptic ulcer disease and lymphoma can have a similar appearance but are felt to be less likely. 2. Cirrhosis and portal venous hypertension. 3. Right ureteral stent. 4. Nonobstructing bilateral intrarenal calculi. 5. Colonic diverticulosis. 6. Status post hysterectomy. 7. Scoliosis and degenerative changes in the spine. Electronically Signed   By: Nolon Nations M.D.   On: 08/06/2016 12:55      Impression / Plan:   AYLSSA HERRIG is a 60 y.o. y/o female with a history of diabetic gastroparesis, non alcoholic cirrhosis of the liver, history of multiple admissions of nausea/vomiting . She was admitted yesterday with elevated sugars as well as nausea and vomiting . She also had diarrhea    Impression and plan: Diabetic gastroparesis with associated nausea and vomiting: likely secondary to a GI bug as she also had diarrhea which in turn caused her sugars to rise and led to acute delay in gastric emptying. Now all symptoms have resolved. She has features of inflammation of her duodenum which may represent infectious enteritis. Advance diet Cirrhosis of liver: Unclear etiology needs outpatient follow up  Microcytic anemia: Iron is low in 12/2015 : would require rechecking and if iron deficient would require outpatient endoscopic evaluation since she has no overt bleeding.  I will sign off.  Please call me if any further GI concerns or questions.  We would like to thank you for the opportunity to participate in the care of [name].    Thank you for involving me in the care of this patient.      LOS: 0 days   Jonathon Bellows, MD  08/07/2016, 10:17 AM

## 2016-08-07 NOTE — Care Management (Signed)
Patient was made new patient follow up appointment previous admission at [facility].  Followed up with patient and she states that she did not go to the appointments due to the "doctor being sick".  I called [facility] and receptionist states that it appeared appointments were cancelled due to lack of transportation.  Patient states to make a follow up appointment any time after December 1st, between 10-12 and she will be able to attend.  Appointment made for Dec 5th at 10:15.  AVS updated and patient notified.

## 2016-08-07 NOTE — Progress Notes (Signed)
[facility] NAME: [name]    MR#:  [id number]  DATE OF BIRTH:  March 30, 1956  SUBJECTIVE: admitted  for nausea, vomiting, diarrhea. Patient says that she feels little better. Tolerating the regular diet. We treated for Shanon Brow and I days.   CHIEF COMPLAINT:   Chief Complaint  Patient presents with  . Abdominal Pain    REVIEW OF SYSTEMS:   ROS CONSTITUTIONAL: No fever, fatigue or weakness.  EYES: No blurred or double vision.  EARS, NOSE, AND THROAT: No tinnitus or ear pain.  RESPIRATORY: No cough, shortness of breath, wheezing or hemoptysis.  CARDIOVASCULAR: No chest pain, orthopnea, edema.  GASTROINTESTINAL: nausea, midepigastric pain. No diarrhea or vomiting. GENITOURINARY: No dysuria, hematuria.  ENDOCRINE: No polyuria, nocturia,  HEMATOLOGY: No anemia, easy bruising or bleeding SKIN: No rash or lesion. MUSCULOSKELETAL: No joint pain or arthritis.   NEUROLOGIC: No tingling, numbness, weakness.  PSYCHIATRY: No anxiety or depression.   DRUG ALLERGIES:   Allergies  Allergen Reactions  . Hydrocodone Other (See Comments)    Pt states that this medication caused cirrhosis of the liver.    . Aspirin   . Erythromycin Other (See Comments)    Reaction:  Fever   . Prednisone Other (See Comments)    Reaction:  Unknown   . Rosiglitazone Maleate Swelling  . Codeine Sulfate Rash  . Tetanus-Diphtheria Toxoids Td Rash and Other (See Comments)    Reaction:  Fever     VITALS:  Blood pressure 118/69, pulse (!) 118, temperature 97.9 F (36.6 C), temperature source Oral, resp. rate 12, height 5' 3"  (1.6 m), weight 55.8 kg (123 lb), SpO2 99 %.  PHYSICAL EXAMINATION:  GENERAL:  60 y.o.-year-old patient lying in the bed with no acute distress.  EYES: Pupils equal, round, reactive to light and accommodation. No scleral icterus. Extraocular muscles intact.  HEENT: Head atraumatic, normocephalic. Oropharynx and nasopharynx clear.   NECK:  Supple, no jugular venous distention. No thyroid enlargement, no tenderness.  LUNGS: Normal breath sounds bilaterally, no wheezing, rales,rhonchi or crepitation. No use of accessory muscles of respiration.  CARDIOVASCULAR: S1, S2 normal. No murmurs, rubs, or gallops.  ABDOMEN:  Mid Epigastric tenderness present. Bowel sounds present. No organomegaly or mass.  EXTREMITIES: No pedal edema, cyanosis, or clubbing.  NEUROLOGIC: Cranial nerves II through XII are intact. Muscle strength 5/5 in all extremities. Sensation intact. Gait not checked.  PSYCHIATRIC: The patient is alert and oriented x 3.  SKIN: No obvious rash, lesion, or ulcer.    LABORATORY PANEL:   CBC  Recent Labs Lab 08/07/16 0434  WBC 9.4  HGB 9.8*  HCT 30.5*  PLT 152   ------------------------------------------------------------------------------------------------------------------  Chemistries   Recent Labs Lab 08/06/16 1416 08/07/16 0434  NA 133* 136  K 3.3* 3.0*  CL 105 110  CO2 20* 20*  GLUCOSE 417* 218*  BUN 25* 26*  CREATININE 0.86 0.82  CALCIUM 8.6* 8.1*  AST 17  --   ALT 16  --   ALKPHOS 93  --   BILITOT 1.1  --    ------------------------------------------------------------------------------------------------------------------  Cardiac Enzymes  Recent Labs Lab 08/06/16 1144  TROPONINI 0.03*   ------------------------------------------------------------------------------------------------------------------  RADIOLOGY:  Dg Chest 1 View  Result Date: 08/06/2016 CLINICAL DATA:  Abdominal pain. EXAM: CHEST 1 VIEW COMPARISON:  07/09/2016. FINDINGS: Mediastinum hilar structures normal. Lungs are clear. Cardiomegaly with normal pulmonary vascularity. IMPRESSION: 1. Cardiomegaly.  No evidence of pulmonary venous congestion. 2. No acute pulmonary disease.  Electronically Signed   By: Marcello Moores  Register   On: 08/06/2016 12:41   Ct Renal Stone Study  Result Date: 08/06/2016 CLINICAL DATA:   C/o abd pain with n/v x 5 days, hx of cholecystectomy and hysterectomy. EXAM: CT ABDOMEN AND PELVIS WITHOUT CONTRAST TECHNIQUE: Multidetector CT imaging of the abdomen and pelvis was performed following the standard protocol without IV contrast. COMPARISON:  07/13/2016 FINDINGS: Lower chest: No pulmonary nodules, pleural effusions, or infiltrates. Heart size is normal. No imaged pericardial effusion or significant coronary artery calcifications. Hepatobiliary: Nodular contour of the liver. No focal liver lesions identified on this noncontrast exam. Status post cholecystectomy. Recanalized umbilical vein. Pancreas: No focal pancreatic lesion identified. Spleen: Normal in size without focal abnormality. Adrenals/Urinary Tract: Mildly nodular adrenal glands bilaterally. The right ureter contains a double-J stent, upper portion within the region of the renal pelvis in the lower portion within the urinary bladder. No definite ureteral stones are identified. Small intrarenal calculi are identified in the lower pole of the kidneys bilaterally. Small punctate calcifications are identified in the midpole left kidney. No hydronephrosis. Stomach/Bowel: There is dilatation and wall thickening of the duodenal bulb, descending, and horizontal portion of the duodenum, most consistent with duodenitis. This is a change since prior study. There is surrounding stranding. No evidence for perforation. Small mesenteric lymph nodes are also present in may be reactive. Remainder of the small bowel including the terminal ileal loops are normal in appearance. There scattered colonic diverticula but no acute diverticulitis. The appendix is not well seen. Vascular/Lymphatic: There is atherosclerotic calcification of the abdominal aorta. No aneurysm. No significant retroperitoneal adenopathy. Reproductive: Status post hysterectomy.  No adnexal mass. Other: None Musculoskeletal: Scoliosis and associated degenerative changes. IMPRESSION: 1.  Interval development of dilatation and wall thickening of the duodenum most consistent with duodenitis. Peptic ulcer disease and lymphoma can have a similar appearance but are felt to be less likely. 2. Cirrhosis and portal venous hypertension. 3. Right ureteral stent. 4. Nonobstructing bilateral intrarenal calculi. 5. Colonic diverticulosis. 6. Status post hysterectomy. 7. Scoliosis and degenerative changes in the spine. Electronically Signed   By: Nolon Nations M.D.   On: 08/06/2016 12:55    EKG:   Orders placed or performed during the hospital encounter of 08/06/16  . ED EKG  . EKG 12-Lead  . EKG 12-Lead  . ED EKG    ASSESSMENT AND PLAN:   #1. diabetic gastroparesis with nausea, vomiting: Patient is a already IV fluids, IV nausea medicines, PPIs  #2. possible viral gastroenteritis with nausea vomiting diarrhea: Continue conservative management.  #3 history of liver cirrhosis #4 history of nonalcoholic liver cirrhosis History of right proximal renal stone ,status post stent, supposed to see urologist but never got to see urology 11/10 for possible stent removal. Patient supposed to be on eliquis  for atrial fibrillation but that was stopped during last admission because of hematuria, as per urology discussion patient supposed to see urologist as an out pt  That Friday ,but patient never got to see urologist. Patient hematuria resolved. Asian early narcolepsy, stent placement during the last her admission, #6. history of chronic pain; has chronic abdominal pains, pain seeking behavior. #7 chronic nausea #8 COPD Anxiety, depression   All the records are reviewed and case discussed with Care Management/Social Workerr. Management plans discussed with the patient, family and they are in agreement.  CODE STATUS: full  TOTAL TIME TAKING CARE OF THIS PATIENT: 35 minutes.   POSSIBLE D/C IN 1-2 DAYS, DEPENDING ON  CLINICAL CONDITION.   Epifanio Lesches M.D on 08/07/2016 at 1:50  PM  Between 7am to 6pm - Pager - [phone]  After 6pm go to [email or web address] - password [email or web address] [facility]  [facility]  Office  [phone]  CC: Primary care physician; No primary care provider on file.   Note: This dictation was prepared with Dragon dictation along with smaller phrase technology. Any transcriptional errors that result from this process are unintentional.

## 2016-08-07 NOTE — Progress Notes (Signed)
The Nurse paged [facility] and asked him to visit a Pt in [address], who had expressed a need to see a Chaplain. [facility] visited the Pt, who was watching TV at the time of this visit and had a conversation with the Pt that centered on God. The Pt told Ch, she was having kidney stones, trusting the Lord to help her push them out, and she requested the [facility] to pray with her. [facility] prayed with the Pt, and offered her compassionate presence and spiritual support. [facility] promised to do follow-up with the Pt, at later time, which he did.     08/07/16 1500  Clinical Encounter Type  Visited With Patient  Visit Type Initial;Follow-up  Referral From Nurse  Consult/Referral To Chaplain  Spiritual Encounters  Spiritual Needs Prayer

## 2016-08-08 DIAGNOSIS — R112 Nausea with vomiting, unspecified: Secondary | ICD-10-CM | POA: Diagnosis not present

## 2016-08-08 DIAGNOSIS — I4891 Unspecified atrial fibrillation: Secondary | ICD-10-CM | POA: Diagnosis not present

## 2016-08-08 DIAGNOSIS — E119 Type 2 diabetes mellitus without complications: Secondary | ICD-10-CM | POA: Diagnosis not present

## 2016-08-08 DIAGNOSIS — K7460 Unspecified cirrhosis of liver: Secondary | ICD-10-CM | POA: Diagnosis not present

## 2016-08-08 LAB — GLUCOSE, CAPILLARY
GLUCOSE-CAPILLARY: 185 mg/dL — AB (ref 65–99)
GLUCOSE-CAPILLARY: 174 mg/dL — AB (ref 65–99)
GLUCOSE-CAPILLARY: 259 mg/dL — AB (ref 65–99)
Glucose-Capillary: 169 mg/dL — ABNORMAL HIGH (ref 65–99)

## 2016-08-08 LAB — BASIC METABOLIC PANEL
ANION GAP: 4 — AB (ref 5–15)
BUN: 17 mg/dL (ref 6–20)
CALCIUM: 8.4 mg/dL — AB (ref 8.9–10.3)
CO2: 20 mmol/L — ABNORMAL LOW (ref 22–32)
CREATININE: 0.79 mg/dL (ref 0.44–1.00)
Chloride: 114 mmol/L — ABNORMAL HIGH (ref 101–111)
Glucose, Bld: 204 mg/dL — ABNORMAL HIGH (ref 65–99)
Potassium: 4.1 mmol/L (ref 3.5–5.1)
SODIUM: 138 mmol/L (ref 135–145)

## 2016-08-08 LAB — MAGNESIUM: MAGNESIUM: 1.6 mg/dL — AB (ref 1.7–2.4)

## 2016-08-08 MED ORDER — APIXABAN 5 MG PO TABS
5.0000 mg | ORAL_TABLET | Freq: Two times a day (BID) | ORAL | Status: DC
  Administered 2016-08-08 – 2016-08-09 (×2): 5 mg via ORAL
  Filled 2016-08-08 (×2): qty 1

## 2016-08-08 MED ORDER — MAGNESIUM SULFATE 2 GM/50ML IV SOLN
2.0000 g | Freq: Once | INTRAVENOUS | Status: AC
  Administered 2016-08-08: 2 g via INTRAVENOUS
  Filled 2016-08-08: qty 50

## 2016-08-08 NOTE — Progress Notes (Signed)
[place] at [place] NAME: [name]    MR#:  [id number]  DATE OF BIRTH:  1956-02-10  Seen at bed side. complains of abdominal pain, nausea, diarrhea. Patient had 3 loose stools since this morning.   CHIEF COMPLAINT:   Chief Complaint  Patient presents with  . Abdominal Pain    REVIEW OF SYSTEMS:   ROS CONSTITUTIONAL: No fever, fatigue or weakness.  EYES: No blurred or double vision.  EARS, NOSE, AND THROAT: No tinnitus or ear pain.  RESPIRATORY: No cough, shortness of breath, wheezing or hemoptysis.  CARDIOVASCULAR: No chest pain, orthopnea, edema.  GASTROINTESTINAL: nausea, midepigastric pain.  diarrhea or vomiting.  GENITOURINARY: No dysuria, hematuria.  ENDOCRINE: No polyuria, nocturia,  HEMATOLOGY: No anemia, easy bruising or bleeding SKIN: No rash or lesion. MUSCULOSKELETAL: No joint pain or arthritis.   NEUROLOGIC: No tingling, numbness, weakness.  PSYCHIATRY: No anxiety or depression.   DRUG ALLERGIES:   Allergies  Allergen Reactions  . Hydrocodone Other (See Comments)    Pt states that this medication caused cirrhosis of the liver.    . Aspirin   . Erythromycin Other (See Comments)    Reaction:  Fever   . Prednisone Other (See Comments)    Reaction:  Unknown   . Rosiglitazone Maleate Swelling  . Codeine Sulfate Rash  . Tetanus-Diphtheria Toxoids Td Rash and Other (See Comments)    Reaction:  Fever     VITALS:  Blood pressure 131/71, pulse 75, temperature 97.4 F (36.3 C), temperature source Axillary, resp. rate 16, height 5' 3"  (1.6 m), weight 55.8 kg (123 lb), SpO2 99 %.  PHYSICAL EXAMINATION:  GENERAL:  60 y.o.-year-old patient lying in the bed with no acute distress.  EYES: Pupils equal, round, reactive to light and accommodation. No scleral icterus. Extraocular muscles intact.  HEENT: Head atraumatic, normocephalic. Oropharynx and nasopharynx clear.  NECK:  Supple, no jugular venous distention.  No thyroid enlargement, no tenderness.  LUNGS: Normal breath sounds bilaterally, no wheezing, rales,rhonchi or crepitation. No use of accessory muscles of respiration.  CARDIOVASCULAR: S1, S2 normal. No murmurs, rubs, or gallops.  ABDOMEN:  Mid Epigastric tenderness present. Bowel sounds present. No organomegaly or mass.  EXTREMITIES: No pedal edema, cyanosis, or clubbing.  NEUROLOGIC: Cranial nerves II through XII are intact. Muscle strength 5/5 in all extremities. Sensation intact. Gait not checked.  PSYCHIATRIC: The patient is alert and oriented x 3.  SKIN: No obvious rash, lesion, or ulcer.    LABORATORY PANEL:   CBC  Recent Labs Lab 08/07/16 0434  WBC 9.4  HGB 9.8*  HCT 30.5*  PLT 152   ------------------------------------------------------------------------------------------------------------------  Chemistries   Recent Labs Lab 08/06/16 1416  08/08/16 0433  NA 133*  < > 138  K 3.3*  < > 4.1  CL 105  < > 114*  CO2 20*  < > 20*  GLUCOSE 417*  < > 204*  BUN 25*  < > 17  CREATININE 0.86  < > 0.79  CALCIUM 8.6*  < > 8.4*  AST 17  --   --   ALT 16  --   --   ALKPHOS 93  --   --   BILITOT 1.1  --   --   < > = values in this interval not displayed. ------------------------------------------------------------------------------------------------------------------  Cardiac Enzymes  Recent Labs Lab 08/06/16 1144  TROPONINI 0.03*   ------------------------------------------------------------------------------------------------------------------  RADIOLOGY:  No results found.  EKG:   Orders placed or  performed during the hospital encounter of 08/06/16  . ED EKG  . EKG 12-Lead  . EKG 12-Lead  . ED EKG    ASSESSMENT AND PLAN:   #1. diabetic gastroparesis with nausea, vomiting: Continue nausea medicine, discontinue IV hydration, encourage by mouth intake, changed to soft diet today.  #2. possible viral gastroenteritis with nausea vomiting diarrhea: Continue  conservative management.  #3 history of liver cirrhosis #4 history of nonalcoholic liver cirrhosis History of right proximal renal stone Status post ESWL,,status post stent, supposed to see urologist but never got to see urology 11/10 for possible stent removal. Patient supposed to be on eliquis  for atrial fibrillation but that was stopped during last admission because of hematuria, as per urology discussion patient supposed to see urologist as an out pt  That Friday ,but patient never got to see urologist. Patient hematuria resolved.  Spoke with Dr. Hollice Espy urology over the phone, recommended outpatient follow-up for stent removal, restarting the  Eliquis.started back on that due to her h/o afib.  #6. history of chronic pain; has chronic abdominal pains, pain seeking behavior. #7 chronic nausea #8 COPD Anxiety, depression   All the records are reviewed and case discussed with Care Management/Social Workerr. Management plans discussed with the patient, family and they are in agreement.  CODE STATUS: full  TOTAL TIME TAKING CARE OF THIS PATIENT: 35 minutes.   POSSIBLE D/C IN 1-2 DAYS, DEPENDING ON CLINICAL CONDITION.   Epifanio Lesches M.D on 08/08/2016 at 2:05 PM  Between 7am to 6pm - Pager - [phone]  After 6pm go to [email or web address] - password [email or web address] [facility]  Office  [phone]  CC: Primary care physician; Sheral Flow, NP   Note: This dictation was prepared with Dragon dictation along with smaller phrase technology. Any transcriptional errors that result from this process are unintentional.

## 2016-08-08 NOTE — Progress Notes (Signed)
[place] visited the Pt in Rm 225 whom he had seen the day before. The Pt appeared to be in good spirit, she told [place] that she had a great sleep, and was grateful for the encouragement she received from the Ch. Pt spoke about he rich Christian inheritance, her desire to obey God, and to continue trusting the Lord to make her well. She was resilient, hopeful, and anticipating to get well soon. Pt told [place] that she was praying for [facility] and his work in the hospital. Pt asked for prayers, which the [facility], provided.     08/08/16 1600  Clinical Encounter Type  Visited With Patient  Visit Type Follow-up  Spiritual Encounters  Spiritual Needs Prayer;Other (Comment)

## 2016-08-08 NOTE — Progress Notes (Signed)
[place] at [place] NAME: [name]    MR#:  [id number]  DATE OF BIRTH:  1955/10/17  Seen at bed side. complains of abdominal pain, nausea, diarrhea. Patient had 3 loose stools since this morning.   CHIEF COMPLAINT:   Chief Complaint  Patient presents with  . Abdominal Pain    REVIEW OF SYSTEMS:   ROS CONSTITUTIONAL: No fever, fatigue or weakness.  EYES: No blurred or double vision.  EARS, NOSE, AND THROAT: No tinnitus or ear pain.  RESPIRATORY: No cough, shortness of breath, wheezing or hemoptysis.  CARDIOVASCULAR: No chest pain, orthopnea, edema.  GASTROINTESTINAL: nausea, midepigastric pain.  diarrhea or vomiting.  GENITOURINARY: No dysuria, hematuria.  ENDOCRINE: No polyuria, nocturia,  HEMATOLOGY: No anemia, easy bruising or bleeding SKIN: No rash or lesion. MUSCULOSKELETAL: No joint pain or arthritis.   NEUROLOGIC: No tingling, numbness, weakness.  PSYCHIATRY: No anxiety or depression.   DRUG ALLERGIES:   Allergies  Allergen Reactions  . Hydrocodone Other (See Comments)    Pt states that this medication caused cirrhosis of the liver.    . Aspirin   . Erythromycin Other (See Comments)    Reaction:  Fever   . Prednisone Other (See Comments)    Reaction:  Unknown   . Rosiglitazone Maleate Swelling  . Codeine Sulfate Rash  . Tetanus-Diphtheria Toxoids Td Rash and Other (See Comments)    Reaction:  Fever     VITALS:  Blood pressure 131/71, pulse 75, temperature 97.4 F (36.3 C), temperature source Oral, resp. rate 16, height 5' 3"  (1.6 m), weight 55.8 kg (123 lb), SpO2 99 %.  PHYSICAL EXAMINATION:  GENERAL:  60 y.o.-year-old patient lying in the bed with no acute distress.  EYES: Pupils equal, round, reactive to light and accommodation. No scleral icterus. Extraocular muscles intact.  HEENT: Head atraumatic, normocephalic. Oropharynx and nasopharynx clear.  NECK:  Supple, no jugular venous distention. No  thyroid enlargement, no tenderness.  LUNGS: Normal breath sounds bilaterally, no wheezing, rales,rhonchi or crepitation. No use of accessory muscles of respiration.  CARDIOVASCULAR: S1, S2 normal. No murmurs, rubs, or gallops.  ABDOMEN:  Mid Epigastric tenderness present. Bowel sounds present. No organomegaly or mass.  EXTREMITIES: No pedal edema, cyanosis, or clubbing.  NEUROLOGIC: Cranial nerves II through XII are intact. Muscle strength 5/5 in all extremities. Sensation intact. Gait not checked.  PSYCHIATRIC: The patient is alert and oriented x 3.  SKIN: No obvious rash, lesion, or ulcer.    LABORATORY PANEL:   CBC  Recent Labs Lab 08/07/16 0434  WBC 9.4  HGB 9.8*  HCT 30.5*  PLT 152   ------------------------------------------------------------------------------------------------------------------  Chemistries   Recent Labs Lab 08/06/16 1416  08/08/16 0433  NA 133*  < > 138  K 3.3*  < > 4.1  CL 105  < > 114*  CO2 20*  < > 20*  GLUCOSE 417*  < > 204*  BUN 25*  < > 17  CREATININE 0.86  < > 0.79  CALCIUM 8.6*  < > 8.4*  AST 17  --   --   ALT 16  --   --   ALKPHOS 93  --   --   BILITOT 1.1  --   --   < > = values in this interval not displayed. ------------------------------------------------------------------------------------------------------------------  Cardiac Enzymes  Recent Labs Lab 08/06/16 1144  TROPONINI 0.03*   ------------------------------------------------------------------------------------------------------------------  RADIOLOGY:  No results found.  EKG:   Orders placed or  performed during the hospital encounter of 08/06/16  . ED EKG  . EKG 12-Lead  . EKG 12-Lead  . ED EKG    ASSESSMENT AND PLAN:   #1. diabetic gastroparesis with nausea, vomiting: Continue nausea medicine, discontinue IV hydration, encourage by mouth intake, changed to soft diet today.  #2. possible viral gastroenteritis with nausea vomiting diarrhea: Continue  conservative management.  #3 history of liver cirrhosis #4 history of nonalcoholic liver cirrhosis History of right proximal renal stone ,status post stent, supposed to see urologist but never got to see urology 11/10 for possible stent removal. Patient supposed to be on eliquis  for atrial fibrillation but that was stopped during last admission because of hematuria, as per urology discussion patient supposed to see urologist as an out pt  That Friday ,but patient never got to see urologist. Patient hematuria resolved.  Consults urology  #6. history of chronic pain; has chronic abdominal pains, pain seeking behavior. #7 chronic nausea #8 COPD Anxiety, depression   All the records are reviewed and case discussed with Care Management/Social Workerr. Management plans discussed with the patient, family and they are in agreement.  CODE STATUS: full  TOTAL TIME TAKING CARE OF THIS PATIENT: 35 minutes.   POSSIBLE D/C IN 1-2 DAYS, DEPENDING ON CLINICAL CONDITION.   Epifanio Lesches M.D on 08/08/2016 at 1:32 PM  Between 7am to 6pm - Pager - [phone]  After 6pm go to [email or web address] - password [email or web address] [facility]  Office  [phone]  CC: Primary care physician; Sheral Flow, NP   Note: This dictation was prepared with Dragon dictation along with smaller phrase technology. Any transcriptional errors that result from this process are unintentional.

## 2016-08-08 NOTE — Progress Notes (Signed)
Per verbal order to D/C enteric precautions and continue pt on contact precautions.  [employer]

## 2016-08-08 NOTE — Care Management Obs Status (Signed)
[facility] NOTIFICATION   Patient Details  Name: [name] MRN: [id number] Date of Birth: October 29, 1955   Medicare Observation Status Notification Given:  Yes    Beverly Sessions, RN 08/08/2016, 9:52 AM

## 2016-08-09 DIAGNOSIS — R112 Nausea with vomiting, unspecified: Secondary | ICD-10-CM | POA: Diagnosis not present

## 2016-08-09 DIAGNOSIS — K7460 Unspecified cirrhosis of liver: Secondary | ICD-10-CM | POA: Diagnosis not present

## 2016-08-09 DIAGNOSIS — E119 Type 2 diabetes mellitus without complications: Secondary | ICD-10-CM | POA: Diagnosis not present

## 2016-08-09 DIAGNOSIS — I4891 Unspecified atrial fibrillation: Secondary | ICD-10-CM | POA: Diagnosis not present

## 2016-08-09 LAB — POTASSIUM: Potassium: 3.8 mmol/L (ref 3.5–5.1)

## 2016-08-09 LAB — MAGNESIUM: Magnesium: 1.7 mg/dL (ref 1.7–2.4)

## 2016-08-09 LAB — GLUCOSE, CAPILLARY
GLUCOSE-CAPILLARY: 209 mg/dL — AB (ref 65–99)
Glucose-Capillary: 213 mg/dL — ABNORMAL HIGH (ref 65–99)

## 2016-08-09 MED ORDER — APIXABAN 5 MG PO TABS: 5.0000 mg | ORAL_TABLET | Freq: Two times a day (BID) | ORAL | 0 refills | Status: DC

## 2016-08-09 NOTE — Care Management (Signed)
Patient to discharge today on eliquis. 30 free eliquis card provided for discharge.

## 2016-08-09 NOTE — Progress Notes (Signed)
Patient discharged to home as ordered. Patient given follow up appointments and coupon for eliquis. Patient has already received this morning dose. Patient is alert and oriented no acute distress noted. Patient denies pain at this time.

## 2016-08-09 NOTE — Discharge Summary (Signed)
[name], is a 60 y.o. female  DOB 06/05/1956  MRN [id number].  Admission date:  08/06/2016  Admitting Physician  Vaughan Basta, MD  Discharge Date:  08/09/2016   Primary MD  Sheral Flow, NP  Recommendations for primary care physician for things to follow:  Follow up with [facility] urology on Friday for stent removal,   Admission Diagnosis  Generalized abdominal pain [R10.84] Left upper quadrant pain [R10.12] Nausea vomiting and diarrhea [R11.2, R19.7]   Discharge Diagnosis  Generalized abdominal pain [R10.84] Left upper quadrant pain [R10.12] Nausea vomiting and diarrhea [R11.2, R19.7]    Active Problems:   Abdominal pain   Intractable nausea and vomiting      Past Medical History:  Diagnosis Date  . Allergy   . Anxiety   . Cancer (HCC)    HX OF CANCER OF UTERUS   . Cirrhosis of liver not due to alcohol ([place]) 2016  . Degenerative disk disease   . Diverticulitis   . Gastroparesis   . GERD (gastroesophageal reflux disease)   . History of hiatal hernia   . Hypertension   . Hypothyroid   . Intussusception intestine ([place]) 05/2015  . PAF (paroxysmal atrial fibrillation) ([place]) 03/2015   a. new onset 03/2015 in setting of intractable N/V; b. on Eliquis 5 mg bid; c. CHADSVASc 4 (DM, TIA x 2, female)  . Pancreatitis   . Sick sinus syndrome ([place])   . Stomach ulcer   . Stroke [facility])    with minimal left sided weakness  . Syncope 01/2015  . TIA (transient ischemic attack) 02/2015  . Type 1 diabetes ([place])    on levemir    Past Surgical History:  Procedure Laterality Date  . ABDOMINAL HYSTERECTOMY    . CARDIAC CATHETERIZATION N/A 01/12/2016   Procedure: Left Heart Cath and Coronary Angiography;  Surgeon: Wellington Hampshire, MD;  Location: [facility] CV LAB;  Service: Cardiovascular;   Laterality: N/A;  . CHOLECYSTECTOMY    . CYSTOSCOPY/URETEROSCOPY/HOLMIUM LASER Right 07/14/2016   Procedure: CYSTOSCOPY/URETEROSCOPY/HOLMIUM LASER;  Surgeon: Alexis Frock, MD;  Location: [facility] ORS;  Service: Urology;  Laterality: Right;  . ESOPHAGOGASTRODUODENOSCOPY N/A 04/04/2015   Procedure: ESOPHAGOGASTRODUODENOSCOPY (EGD);  Surgeon: Hulen Luster, MD;  Location: [facility] ENDOSCOPY;  Service: Endoscopy;  Laterality: N/A;  . ESOPHAGOGASTRODUODENOSCOPY (EGD) WITH PROPOFOL N/A 01/18/2016   Procedure: ESOPHAGOGASTRODUODENOSCOPY (EGD) WITH PROPOFOL;  Surgeon: Lucilla Lame, MD;  Location: [facility] ENDOSCOPY;  Service: Endoscopy;  Laterality: N/A;  . FLEXIBLE SIGMOIDOSCOPY N/A 01/18/2016   Procedure: FLEXIBLE SIGMOIDOSCOPY;  Surgeon: Lucilla Lame, MD;  Location: [facility] ENDOSCOPY;  Service: Endoscopy;  Laterality: N/A;  . HERNIA REPAIR         History of present illness and  Hospital Course:     Kindly see H&P for history of present illness and admission details, please review complete Labs, Consult reports and Test reports for all details in brief  HPI  from the history and physical done on the day of admission Admitted  on November 27 because of abdominal pain, intractable nausea, vomiting, possible diabetic gastroparesis/viral gastroenteritis. Hospital Course  1 abdominal pain, nausea, vomiting, diarrhea: Seen by gastroenterology. Patient to received IV hydration, stool for C. difficile has been negative. Received IV nausea medicines. Patient thought to have diabetic gastroparesis flare versus viral gastroenteritis. Patient symptoms nicely improved. She had a CT abdomen showing duodenitis. Improved with PPIs. She  is tolerating the right, she is eager  to go home today. #2.chronic abdominal pains, narcotic seeking behavior.; High  risk for recurrent admissions, admitted 10 times in the last 6 months. Patient takes MS Contin 30 MG every 12 hours, oxycodone 5 mg every 4 hours as needed for pain. #4. Patient has  chronic nausea: Uses when necessary nausea medicines #5 history of liver cirrhosis patient is on rifaxamine,aladactone #6 history of COPD: No wheezing. Continue Symbicort. #7 paroxysmal atrial fibrillation: Continue metoprolol, started the blood thinners after talking to Dr. Hollice Espy a urologist.  For a diabetes mellitus type 2: Continue NovoLog, Levemir. #8 hypothyroidism continue his entire. #9. depression, anxiety: Also has chronic pain syndrome. 10//Right UPJ Stone with Refractory Nausea / Vomitting -s/p right ureteroscopy / laser / stent 11/4/`7 for 31m Rt UPJ stone. She is to follow up with urology as an outpatient, I spoke with Dr. AHollice Espyyesterday, she'll make sure patient is followed with them as outpatient for stent removal.   Discharge Condition:    Follow UP  Follow-up Information    CSheral Flow NP Follow up on 08/14/2016.   Specialty:  Internal Medicine Why:  Arrive at 10:00 am Contact information: [phone]        AHollice Espy MD Follow up.   Specialty:  Urology Contact information: [phone]            Discharge Instructions  and  Discharge Medications        Medication List    STOP taking these medications   cephALEXin 500 MG capsule Commonly known as:  [place] medications   acidophilus Caps capsule Take 2 capsules by mouth daily.   albuterol 108 (90 Base) MCG/ACT inhaler Commonly known as:  PROVENTIL HFA;VENTOLIN HFA Inhale 2 puffs into the lungs every 6 (six) hours as needed for wheezing or shortness of breath.   apixaban 5 MG Tabs tablet Commonly known as:  ELIQUIS Take 1 tablet (5 mg total) by mouth 2 (two) times daily.   atorvastatin 40 MG tablet Commonly known as:  LIPITOR Take 1 tablet (40 mg total) by mouth at bedtime.   bismuth subsalicylate 2295MAO/13YQsuspension Commonly known as:   PEPTO BISMOL Take 30 mLs by mouth every 4 (four) hours as needed for diarrhea or loose stools.   budesonide-formoterol 160-4.5 MCG/ACT inhaler Commonly known as:  SYMBICORT Inhale 2 puffs into the lungs 2 (two) times daily.   diphenhydrAMINE 25 mg capsule Commonly known as:  BENADRYL Take 2 capsules (50 mg total) by mouth every 6 (six) hours as needed. What changed:  reasons to take this   feeding supplement (GLUCERNA SHAKE) Liqd Take 237 mLs by mouth 3 (three) times daily between meals.   insulin aspart 100 UNIT/ML injection Commonly known as:  novoLOG Inject 5 Units into the skin 3 (three) times daily with meals as needed for high blood sugar. Pt uses as needed per sliding scale.   insulin detemir 100 UNIT/ML injection Commonly known as:  LEVEMIR Inject 0.07 mLs (7 Units total) into the skin at bedtime.   levothyroxine 75 MCG tablet Commonly known as:  SYNTHROID, LEVOTHROID Take 1 tablet (75 mcg total) by mouth daily before breakfast.   metoCLOPramide 10 MG tablet Commonly known as:  REGLAN Take 1 tablet (10 mg total) by mouth 4 (four) times daily -  before meals and at bedtime.   metoprolol tartrate 25 MG tablet Commonly known as:  LOPRESSOR Take 1 tablet (25 mg total) by mouth  2 (two) times daily.   morphine 30 MG 12 hr tablet Commonly known as:  MS CONTIN Take 1 tablet (30 mg total) by mouth every 12 (twelve) hours.   ondansetron 4 MG disintegrating tablet Commonly known as:  ZOFRAN ODT Take 1 tablet (4 mg total) by mouth every 8 (eight) hours as needed for nausea or vomiting.   oxyCODONE 5 MG immediate release tablet Commonly known as:  Oxy IR/ROXICODONE Take 2 tablets (10 mg total) by mouth every 4 (four) hours as needed for moderate pain.   pantoprazole 40 MG tablet Commonly known as:  PROTONIX Take 1 tablet (40 mg total) by mouth 2 (two) times daily before a meal.   rifaximin 550 MG Tabs tablet Commonly known as:  XIFAXAN Take 1 tablet (550 mg total) by  mouth 2 (two) times daily.   spironolactone 25 MG tablet Commonly known as:  ALDACTONE Take 1 tablet (25 mg total) by mouth daily.   tiZANidine 4 MG tablet Commonly known as:  ZANAFLEX Take 1 tablet (4 mg total) by mouth 3 (three) times daily as needed for muscle spasms.         Diet and Activity recommendation: See Discharge Instructions above   Consults obtained - urology   Major procedures and Radiology Reports - PLEASE review detailed and final reports for all details, in brief -      Dg Chest 1 View  Result Date: 08/06/2016 CLINICAL DATA:  Abdominal pain. EXAM: CHEST 1 VIEW COMPARISON:  07/09/2016. FINDINGS: Mediastinum hilar structures normal. Lungs are clear. Cardiomegaly with normal pulmonary vascularity. IMPRESSION: 1. Cardiomegaly.  No evidence of pulmonary venous congestion. 2. No acute pulmonary disease. Electronically Signed   By: Marcello Moores  Register   On: 08/06/2016 12:41   Ct Abdomen Pelvis W Contrast  Result Date: 07/13/2016 CLINICAL DATA:  Upper abdominal pain, nausea and vomiting. Hematuria. Four days duration. No improvement with antibiotic started 2 days ago. EXAM: CT ABDOMEN AND PELVIS WITH CONTRAST TECHNIQUE: Multidetector CT imaging of the abdomen and pelvis was performed using the standard protocol following bolus administration of intravenous contrast. CONTRAST:  136m ISOVUE-300 IOPAMIDOL (ISOVUE-300) INJECTION 61% COMPARISON:  03/13/2016 FINDINGS: Lower chest: No acute abnormality. Hepatobiliary: Cirrhotic liver without focal lesion. Portal vein is patent. Evidence of portal hypertension, with prominent varices of the distal esophagus and upper abdomen. Prior cholecystectomy. Normal bile ducts. Pancreas: Unremarkable. No pancreatic ductal dilatation or surrounding inflammatory changes. Spleen: Normal in size without focal abnormality. Adrenals/Urinary Tract: Obstructing right ureteropelvic junction calculus measuring 5 x 8 x 10 mm, previously located in the  lower pole collecting system on 03/13/16. Mural enhancement of the right ureter and right collecting system, nonspecific but worrisome for upper tract infection. At least 2 collecting system calculi of the left midpole on left lower pole, 2-3 mm each. No significant renal parenchymal lesions. Urinary bladder is unremarkable. Stomach/Bowel: Distal esophageal varices. Stomach, small bowel and colon are unremarkable. Vascular/Lymphatic: The abdominal aorta is normal in caliber with mild atherosclerotic calcification. No adenopathy is evident in the abdomen or pelvis. Reproductive: Hysterectomy.  No adnexal abnormalities. Other: Small volume ascites, decreased from 03/13/2016 Musculoskeletal: No acute or significant osseous findings. IMPRESSION: 1. Obstructing right UPJ calculus measuring 5 x 8 x 10 mm. Nonspecific mural enhancement of the right ureter right collecting system, sometimes seen with upper tract infection. 2. Left nephrolithiasis. 3. Cirrhosis with small volume ascites. Upper abdominal and distal esophageal varices. Electronically Signed   By: DAndreas NewportM.D.   On: 07/13/2016 23:59  Ct Renal Stone Study  Result Date: 08/06/2016 CLINICAL DATA:  C/o abd pain with n/v x 5 days, hx of cholecystectomy and hysterectomy. EXAM: CT ABDOMEN AND PELVIS WITHOUT CONTRAST TECHNIQUE: Multidetector CT imaging of the abdomen and pelvis was performed following the standard protocol without IV contrast. COMPARISON:  07/13/2016 FINDINGS: Lower chest: No pulmonary nodules, pleural effusions, or infiltrates. Heart size is normal. No imaged pericardial effusion or significant coronary artery calcifications. Hepatobiliary: Nodular contour of the liver. No focal liver lesions identified on this noncontrast exam. Status post cholecystectomy. Recanalized umbilical vein. Pancreas: No focal pancreatic lesion identified. Spleen: Normal in size without focal abnormality. Adrenals/Urinary Tract: Mildly nodular adrenal glands  bilaterally. The right ureter contains a double-J stent, upper portion within the region of the renal pelvis in the lower portion within the urinary bladder. No definite ureteral stones are identified. Small intrarenal calculi are identified in the lower pole of the kidneys bilaterally. Small punctate calcifications are identified in the midpole left kidney. No hydronephrosis. Stomach/Bowel: There is dilatation and wall thickening of the duodenal bulb, descending, and horizontal portion of the duodenum, most consistent with duodenitis. This is a change since prior study. There is surrounding stranding. No evidence for perforation. Small mesenteric lymph nodes are also present in may be reactive. Remainder of the small bowel including the terminal ileal loops are normal in appearance. There scattered colonic diverticula but no acute diverticulitis. The appendix is not well seen. Vascular/Lymphatic: There is atherosclerotic calcification of the abdominal aorta. No aneurysm. No significant retroperitoneal adenopathy. Reproductive: Status post hysterectomy.  No adnexal mass. Other: None Musculoskeletal: Scoliosis and associated degenerative changes. IMPRESSION: 1. Interval development of dilatation and wall thickening of the duodenum most consistent with duodenitis. Peptic ulcer disease and lymphoma can have a similar appearance but are felt to be less likely. 2. Cirrhosis and portal venous hypertension. 3. Right ureteral stent. 4. Nonobstructing bilateral intrarenal calculi. 5. Colonic diverticulosis. 6. Status post hysterectomy. 7. Scoliosis and degenerative changes in the spine. Electronically Signed   By: Nolon Nations M.D.   On: 08/06/2016 12:55    Micro Results    Recent Results (from the past 240 hour(s))  Urine culture     Status: Abnormal   Collection Time: 08/03/16  6:32 PM  Result Value Ref Range Status   Specimen Description URINE, RANDOM  Final   Special Requests NONE  Final   Culture  MULTIPLE SPECIES PRESENT, SUGGEST RECOLLECTION (A)  Final   Report Status 08/05/2016 FINAL  Final       Today   Subjective:   [name] today has no headache,no chest abdominal pain,no new weakness tingling or numbness, feels much better wants to go home today.   Objective:   Blood pressure 134/61, pulse 80, temperature 98.8 F (37.1 C), temperature source Oral, resp. rate 20, height 5' 3"  (1.6 m), weight 55.8 kg (123 lb), SpO2 99 %.   Intake/Output Summary (Last 24 hours) at 08/09/16 1416 Last data filed at 08/09/16 0855  Gross per 24 hour  Intake              166 ml  Output             2600 ml  Net            -2434 ml    Exam Awake Alert, Oriented x 3, No new F.N deficits, Normal affect [place].AT,PERRAL Supple Neck,No JVD, No cervical lymphadenopathy appriciated.  Symmetrical Chest wall movement, Good air movement bilaterally, CTAB RRR,No  Gallops,Rubs or new Murmurs, No Parasternal Heave +ve B.Sounds, Abd Soft, Non tender, No organomegaly appriciated, No rebound -guarding or rigidity. No Cyanosis, Clubbing or edema, No new Rash or bruise  Data Review   CBC w Diff:  Lab Results  Component Value Date   WBC 9.4 08/07/2016   HGB 9.8 (L) 08/07/2016   HGB 12.9 10/13/2014   HCT 30.5 (L) 08/07/2016   HCT 38.4 10/13/2014   PLT 152 08/07/2016   PLT 113 (L) 10/13/2014   LYMPHOPCT 11 08/06/2016   LYMPHOPCT 24.8 10/13/2014   BANDSPCT 0 10/26/2007   MONOPCT 9 08/06/2016   MONOPCT 9.6 10/13/2014   EOSPCT 1 08/06/2016   EOSPCT 2.0 10/13/2014   BASOPCT 0 08/06/2016   BASOPCT 0.4 10/13/2014    CMP:  Lab Results  Component Value Date   NA 138 08/08/2016   NA 138 10/14/2014   K 3.8 08/09/2016   K 4.1 10/14/2014   CL 114 (H) 08/08/2016   CL 107 10/14/2014   CO2 20 (L) 08/08/2016   CO2 26 10/14/2014   BUN 17 08/08/2016   BUN 3 (L) 10/14/2014   CREATININE 0.79 08/08/2016   CREATININE 0.68 10/14/2014   PROT 6.1 (L) 08/06/2016   PROT 5.7 (L) 10/12/2014   ALBUMIN  3.4 (L) 08/06/2016   ALBUMIN 2.7 (L) 10/12/2014   BILITOT 1.1 08/06/2016   BILITOT 0.8 10/12/2014   ALKPHOS 93 08/06/2016   ALKPHOS 78 10/12/2014   AST 17 08/06/2016   AST 42 (H) 10/12/2014   ALT 16 08/06/2016   ALT 95 (H) 10/12/2014  .   Total Time in preparing paper work, data evaluation and todays exam - 40 minutes  Danniel Tones M.D on 08/09/2016 at 2:16 PM    Note: This dictation was prepared with Dragon dictation along with smaller phrase technology. Any transcriptional errors that result from this process are unintentional.

## 2016-08-09 NOTE — Progress Notes (Signed)
Patient requested an Advanced Directives. She said that she had one at home but they wanted her to do another one since she had no one in her file. Chaplain provided education and gave the patient a copy of an AD.

## 2016-08-13 ENCOUNTER — Telehealth: Payer: Self-pay | Admitting: *Deleted

## 2016-08-13 NOTE — Telephone Encounter (Signed)
No answer. No voicemail option.

## 2016-08-14 ENCOUNTER — Telehealth: Payer: Self-pay | Admitting: Primary Care

## 2016-08-14 ENCOUNTER — Ambulatory Visit: Payer: Commercial Managed Care - HMO | Admitting: Primary Care

## 2016-08-14 DIAGNOSIS — Z0289 Encounter for other administrative examinations: Secondary | ICD-10-CM

## 2016-08-14 NOTE — Telephone Encounter (Signed)
Patient did not come in for their appointment today for follow up [facility]  Please let me know if patient needs to be contacted immediately for follow up or no follow up needed.

## 2016-08-14 NOTE — Telephone Encounter (Signed)
Yes, patient needs follow up for recurrent hospital admission. This will need to be a 45 minute appointment given numerous hospital visits.

## 2016-08-15 ENCOUNTER — Encounter: Payer: Self-pay | Admitting: Primary Care

## 2016-08-15 NOTE — Telephone Encounter (Signed)
Pt scheduled for 12:20.

## 2016-08-17 NOTE — Telephone Encounter (Signed)
Mailed Letter certified to patient 08/15/16 / lt

## 2016-08-27 ENCOUNTER — Other Ambulatory Visit: Payer: Commercial Managed Care - HMO

## 2016-08-29 ENCOUNTER — Ambulatory Visit: Payer: Commercial Managed Care - HMO | Admitting: Primary Care

## 2016-08-31 IMAGING — CT CT HEAD WITHOUT CONTRAST
1 series · 15 of 30 positions shown, 19 images · non-contrast
Comparison: CT of the head performed 11/11/2011

CLINICAL DATA: Acute onset of nausea and vomiting. Altered mental
status and loss of memory. Initial encounter.

EXAM:
CT HEAD WITHOUT CONTRAST
TECHNIQUE: Contiguous axial images were obtained from the base of the skull
through the vertex without intravenous contrast.

[Series 2: head wo · axial · 0.41mm/px · z∈[-116,+13]mm · 15 of 32 slices shown, 19 images]
[im 2/32  brain]
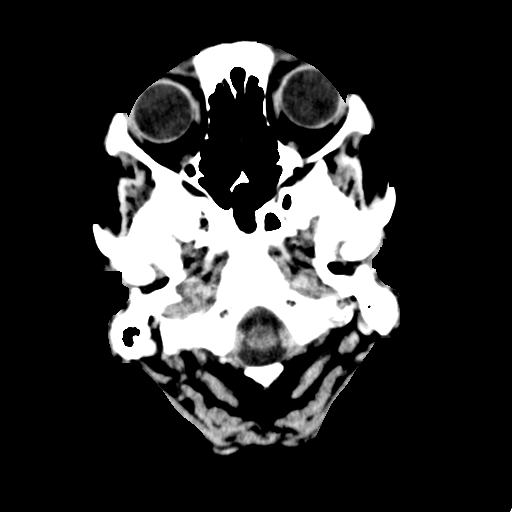
[im 2/32  bone]
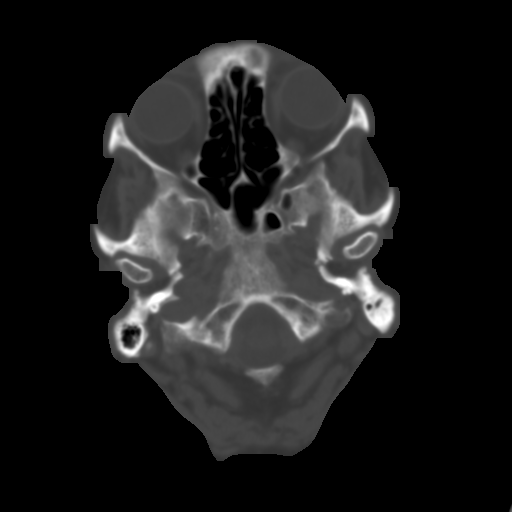
[im 4/32  brain]
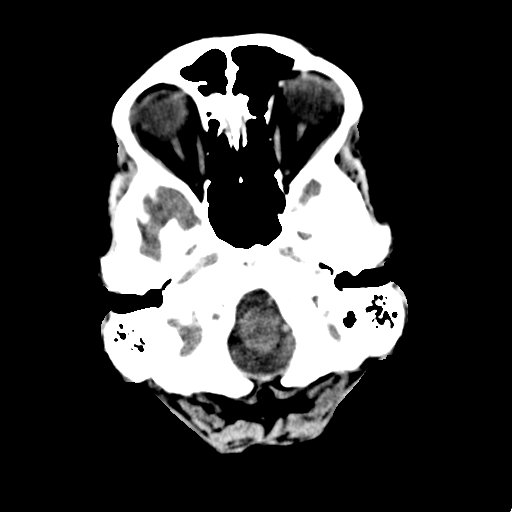
[im 6/32  brain]
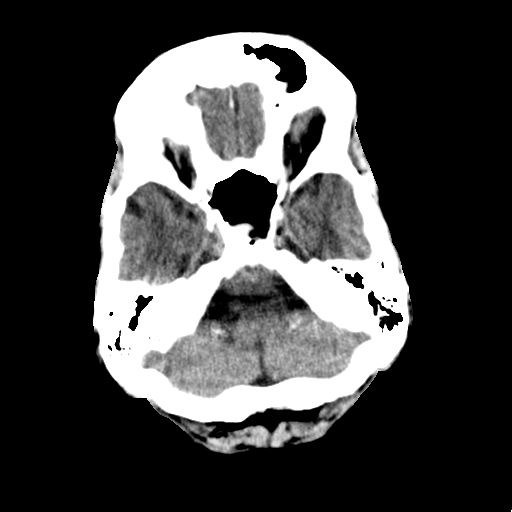
[im 8/32  brain]
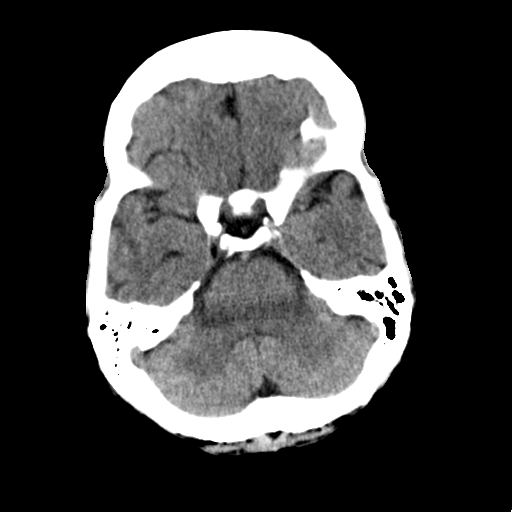
[im 10/32  brain]
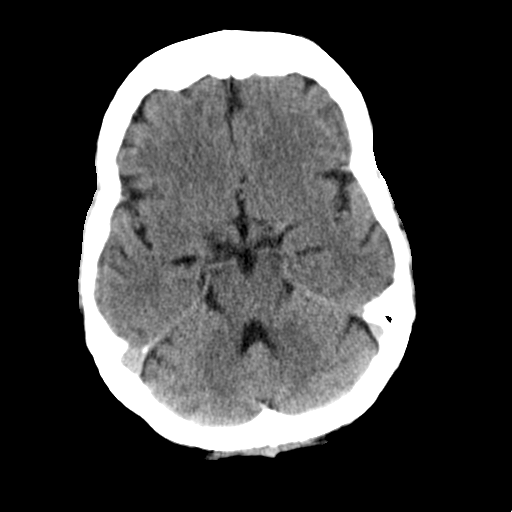
[im 10/32  bone]
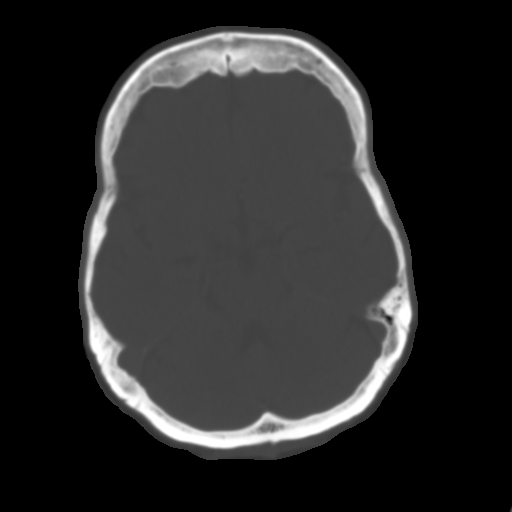
[im 12/32  brain]
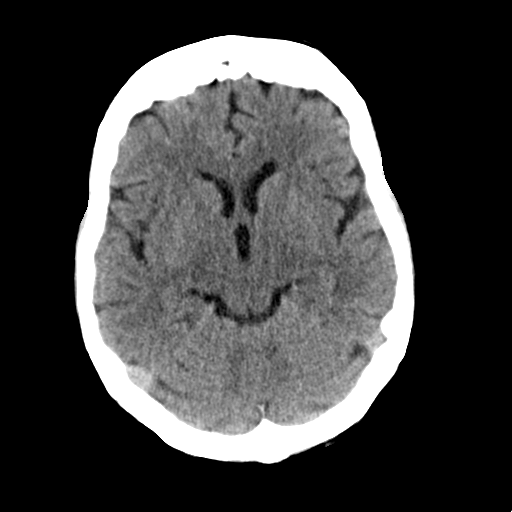
[im 14/32  brain]
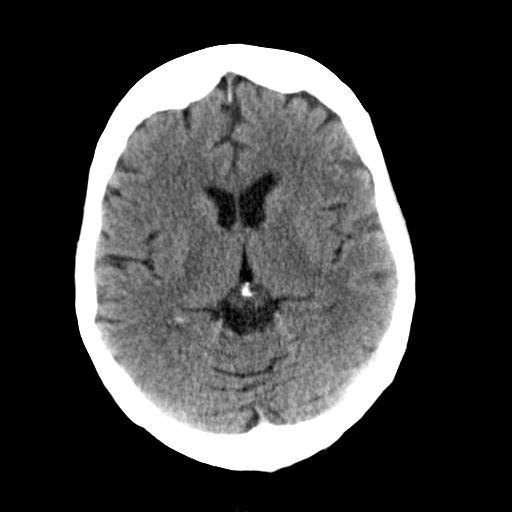
[im 17/32  brain]
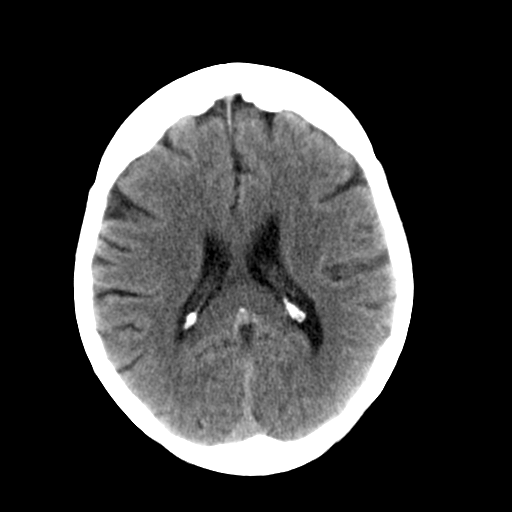
[im 18/32  brain]
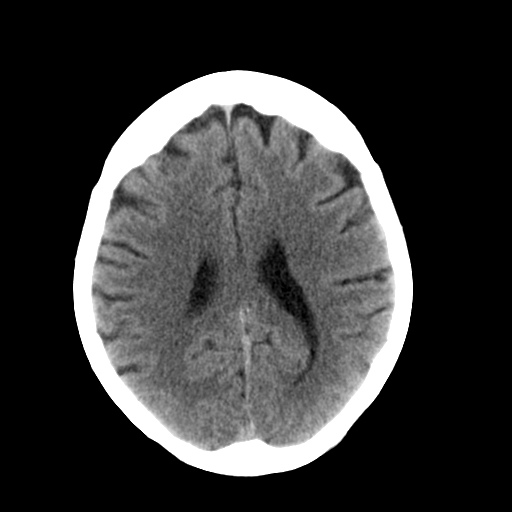
[im 18/32  bone]
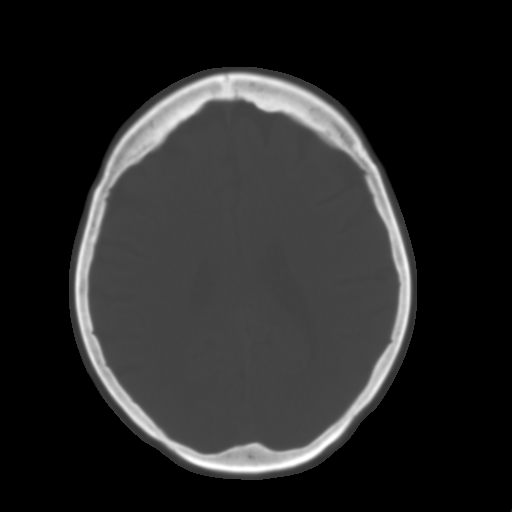
[im 20/32  brain]
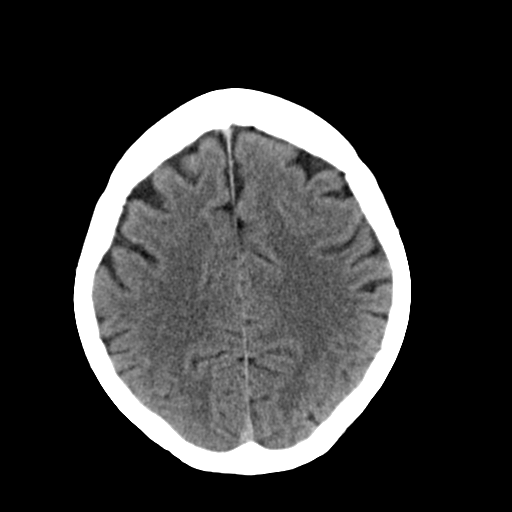
[im 22/32  brain]
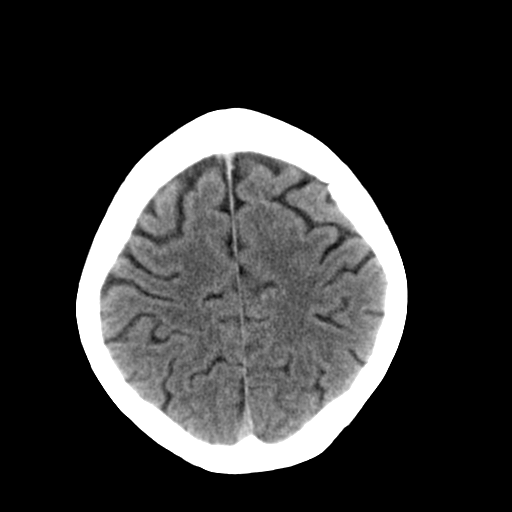
[im 24/32  brain]
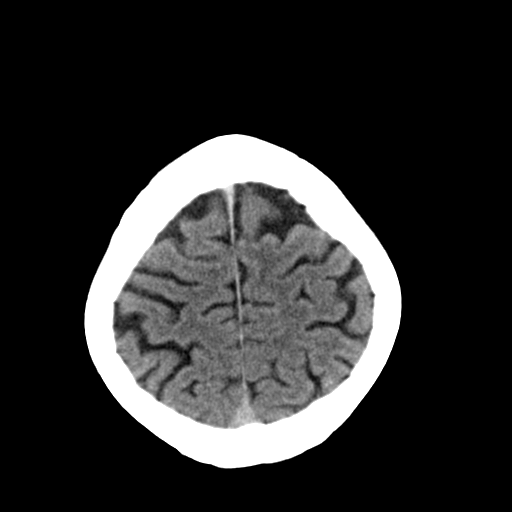
[im 26/32  brain]
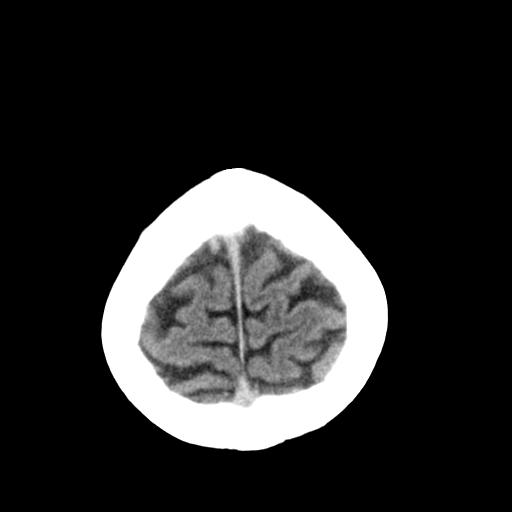
[im 26/32  bone]
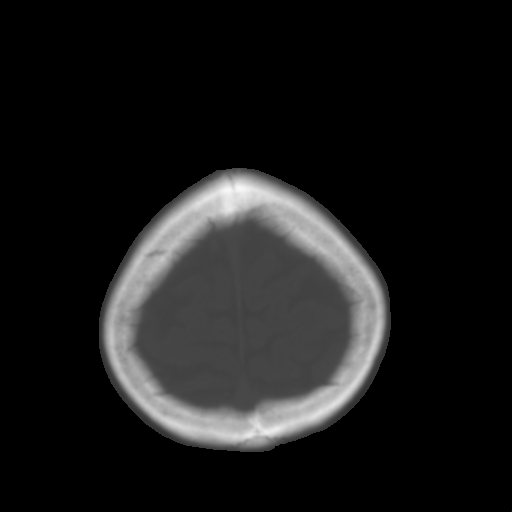
[im 28/32  brain]
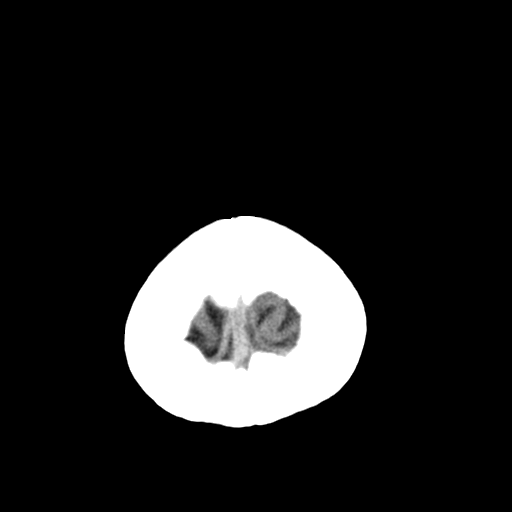
[im 30/32  brain]
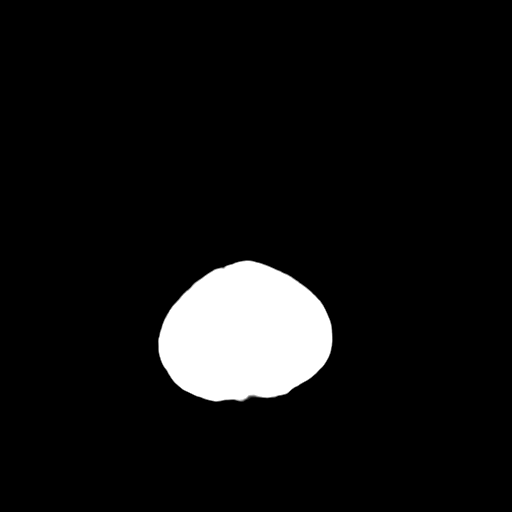

[15 of 30 positions shown; findings below may reference images not displayed]

FINDINGS: There is no evidence of acute infarction, mass lesion, or intra- or
extra-axial hemorrhage on CT.

The posterior fossa, including the cerebellum, brainstem and fourth
ventricle, is within normal limits. The third and lateral
ventricles, and basal ganglia are unremarkable in appearance. The
cerebral hemispheres are symmetric in appearance, with normal
gray-white differentiation. No mass effect or midline shift is seen.

There is no evidence of fracture; visualized osseous structures are
unremarkable in appearance. The visualized portions of the orbits
are within normal limits. There is minimal partial opacification of
the mastoid air cells bilaterally. The paranasal sinuses are
well-aerated. No significant soft tissue abnormalities are seen.
IMPRESSION: 1. No acute intracranial pathology seen on CT.
2. Minimal partial opacification of the mastoid air cells
bilaterally.

## 2016-08-31 IMAGING — CT CT ABD-PELV W/ CM
2 of 5 series · 16 of 46 positions shown, 18 images · IV contrast (omnipaque)
Comparison: CT of the abdomen and pelvis from 07/16/2013

CLINICAL DATA: Acute onset of nausea and vomiting for 3 days.
Generalized abdominal pain. Leukocytosis. Initial encounter.

EXAM:
CT ABDOMEN AND PELVIS WITH CONTRAST
TECHNIQUE: Multidetector CT imaging of the abdomen and pelvis was performed
using the standard protocol following bolus administration of
intravenous contrast.
CONTRAST:  100 mL of Omnipaque 300 IV contrast

[Series 2: routine abd pel with · axial · 0.74mm/px · z∈[-828,-394]mm · 13 of 97 slices shown, 15 images]
[im 5/97  soft-tissue]
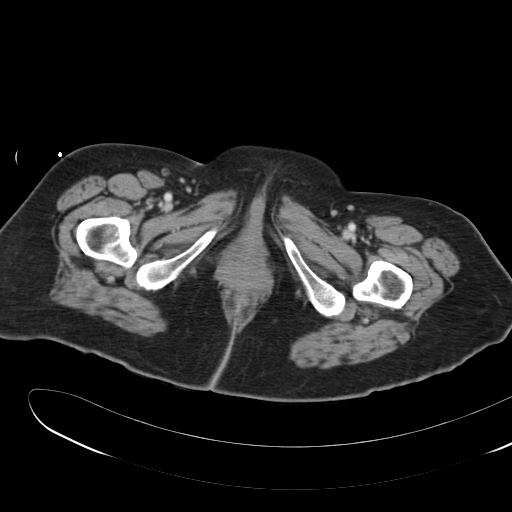
[im 5/97  bone]
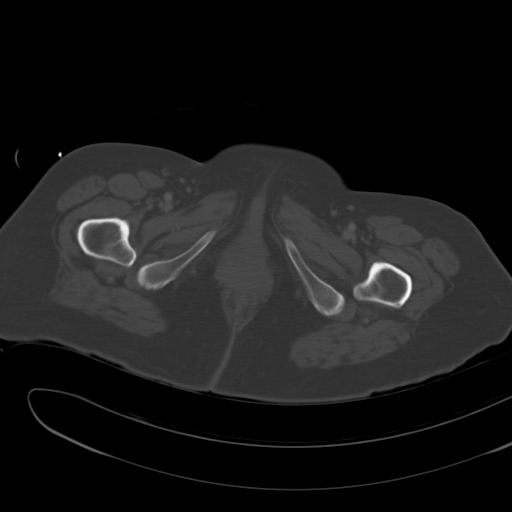
[im 15/97  soft-tissue]
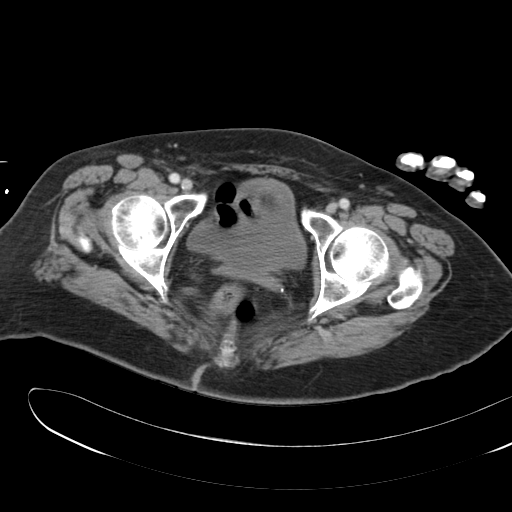
[im 20/97  soft-tissue]
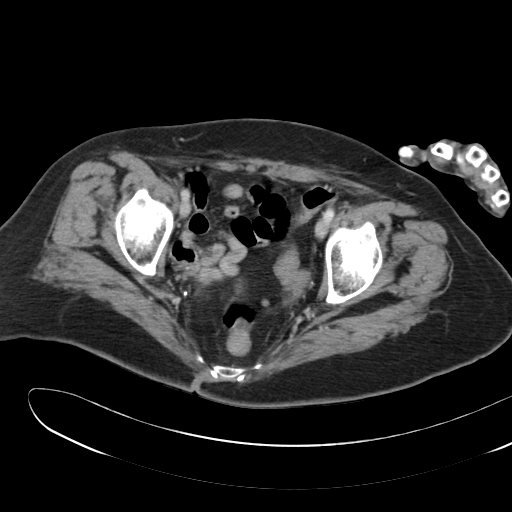
[im 29/97  soft-tissue]
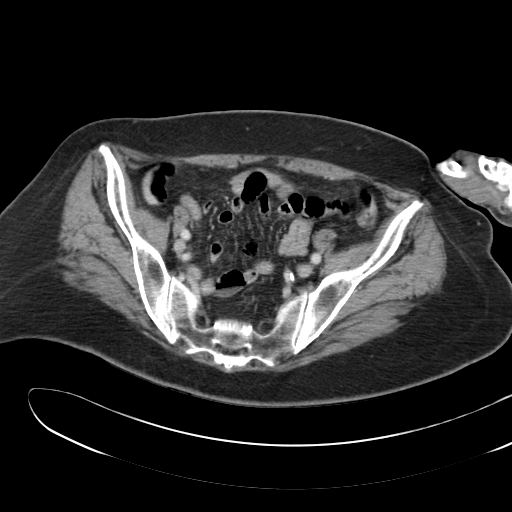
[im 34/97  soft-tissue]
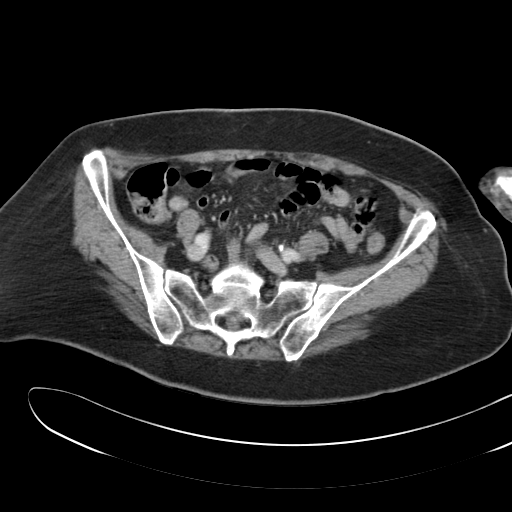
[im 44/97  soft-tissue]
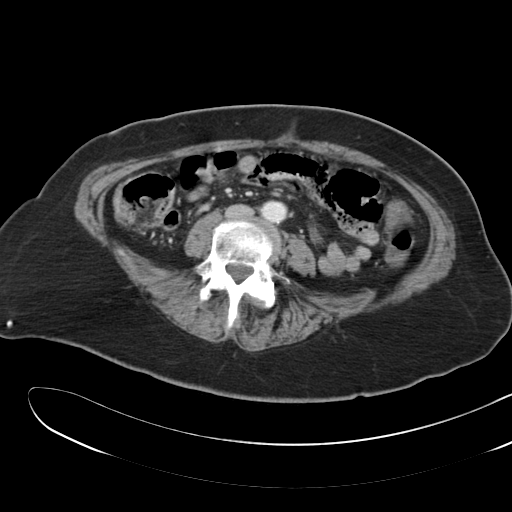
[im 49/97  soft-tissue]
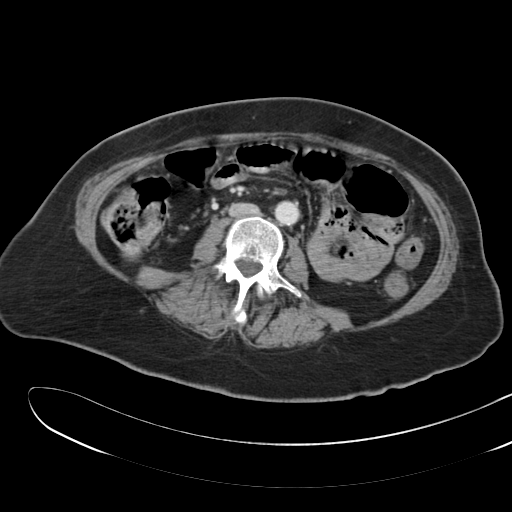
[im 53/97  soft-tissue]
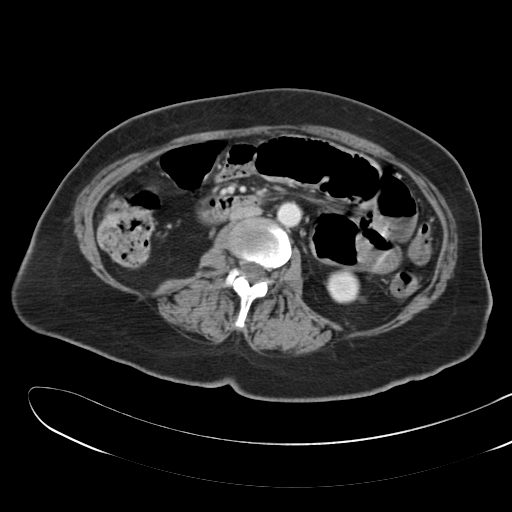
[im 63/97  soft-tissue]
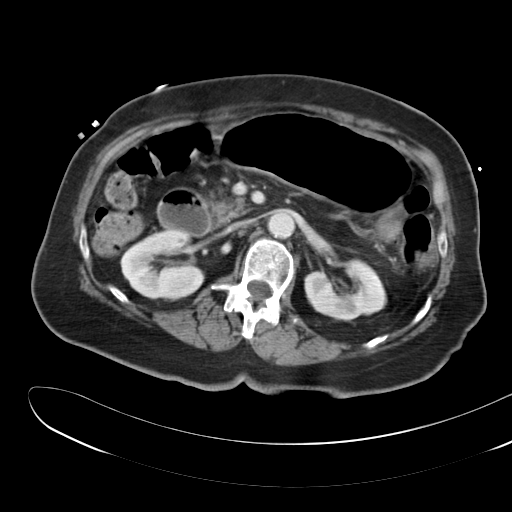
[im 63/97  bone]
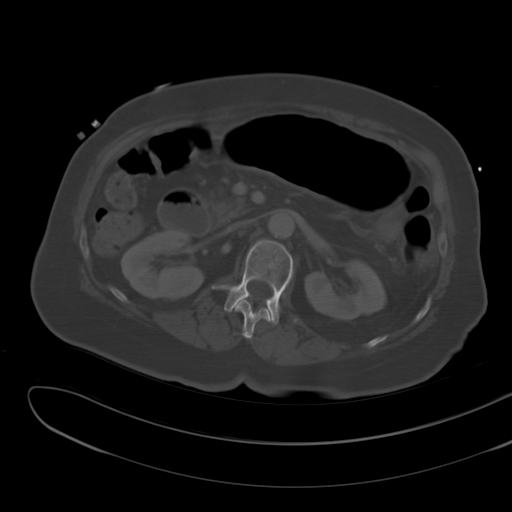
[im 68/97  soft-tissue]
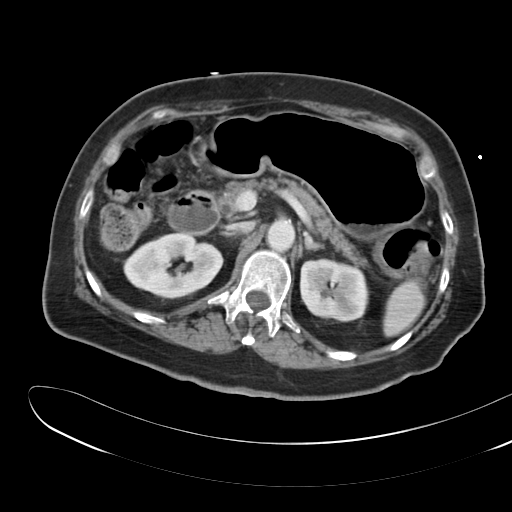
[im 77/97  soft-tissue]
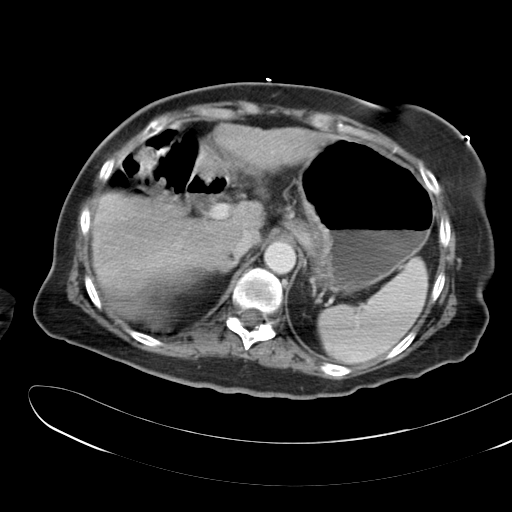
[im 82/97  soft-tissue]
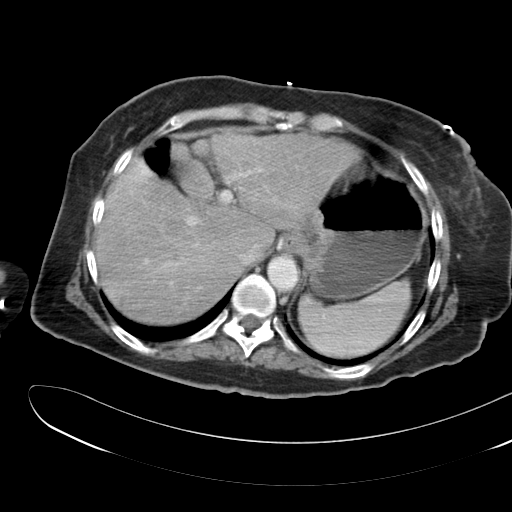
[im 92/97  soft-tissue]
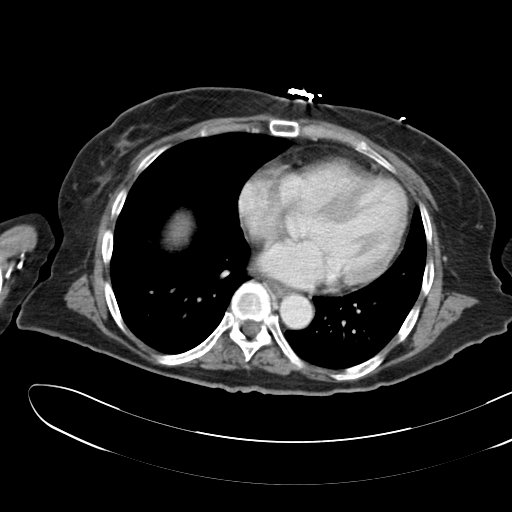

[Series 6: cor routine abd pel with · coronal · 0.99mm/px · 3 of 119 slices shown]
[im 40/119  soft-tissue]
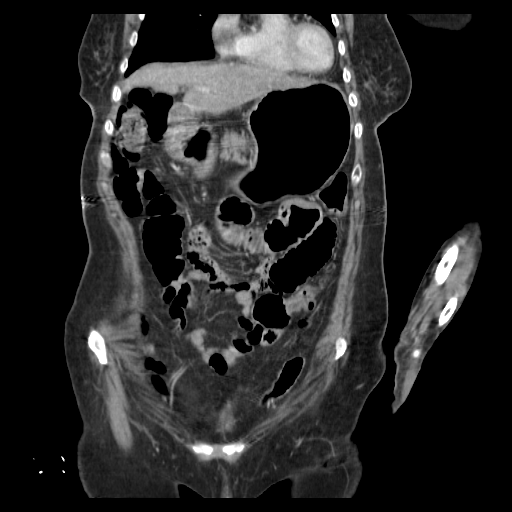
[im 53/119  soft-tissue]
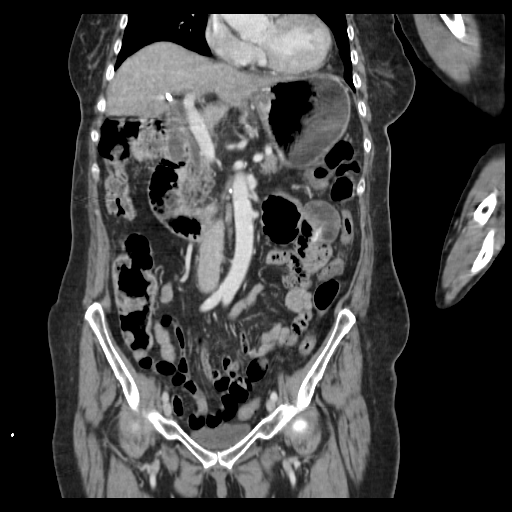
[im 66/119  soft-tissue]
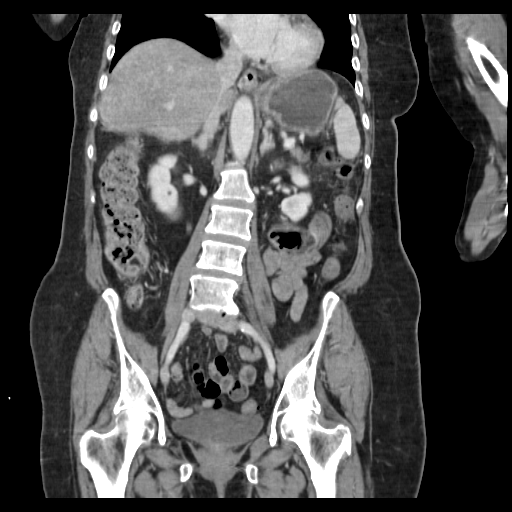

[16 of 46 positions shown; findings below may reference images not displayed]

FINDINGS: The visualized lung bases are clear.

The slightly nodular appearance to the liver raises concern for
hepatic cirrhosis. The slightly heterogeneous attenuation could
reflect mild hepatic venous congestion. The spleen is unremarkable
in appearance. The patient is status post cholecystectomy, with
clips noted at the gallbladder fossa. The pancreas and adrenal
glands are unremarkable. Mild apparent stranding about the head of
the pancreas is thought reflect fatty infiltration within portions
of the head of the pancreas.

Bilateral nonobstructing renal stones are seen, measuring up to 6 mm
in size. A few tiny bilateral renal cysts are noted. Nonspecific
perinephric stranding is noted bilaterally. There is no evidence of
hydronephrosis. No obstructing ureteral stones are identified.

No free fluid is identified. The small bowel is unremarkable in
appearance. The stomach is within normal limits. No acute vascular
abnormalities are seen. Minimal calcification is seen along the
distal abdominal aorta and its branches.

The appendix is diminutive and not well characterized; there is no
evidence of appendicitis. The transverse colon is redundant. There
appears to be mild wall thickening along the distal transverse and
proximal descending colon, which could reflect a mild infectious or
inflammatory process, though this is only mildly more prominent than
in 8617. A single diverticulum is noted along the sigmoid colon.
Additional described diverticulosis is not well seen.

The bladder is mildly distended and grossly unremarkable. The
patient is status post hysterectomy. No suspicious adnexal masses
are seen. No inguinal lymphadenopathy is seen.

No acute osseous abnormalities are identified.
IMPRESSION: 1. Question of mild wall thickening along the distal transverse and
proximal descending colon, which could reflect a mild infectious or
inflammatory process, though this is only mildly more prominent than
in 8617 and could remain within normal limits.
2. No significant diverticulosis characterized on this study. No
definite evidence of diverticulitis.
3. Findings of hepatic cirrhosis; question of mild hepatic venous
congestion.
4. Bilateral nonobstructing renal stones, measuring up to 6 mm in
size. Few tiny bilateral renal cysts seen.

## 2016-08-31 IMAGING — CR DG CHEST 1V PORT
1 series · 1 of 1 positions shown · non-contrast
Comparison: 09/29/2012

CLINICAL DATA: Abdominal pain with nausea and vomiting for 3 days.
History of diverticulitis, esophagitis, cirrhosis, diabetes,
pancreatitis, GERD

EXAM:
PORTABLE CHEST - 1 VIEW

[ap]
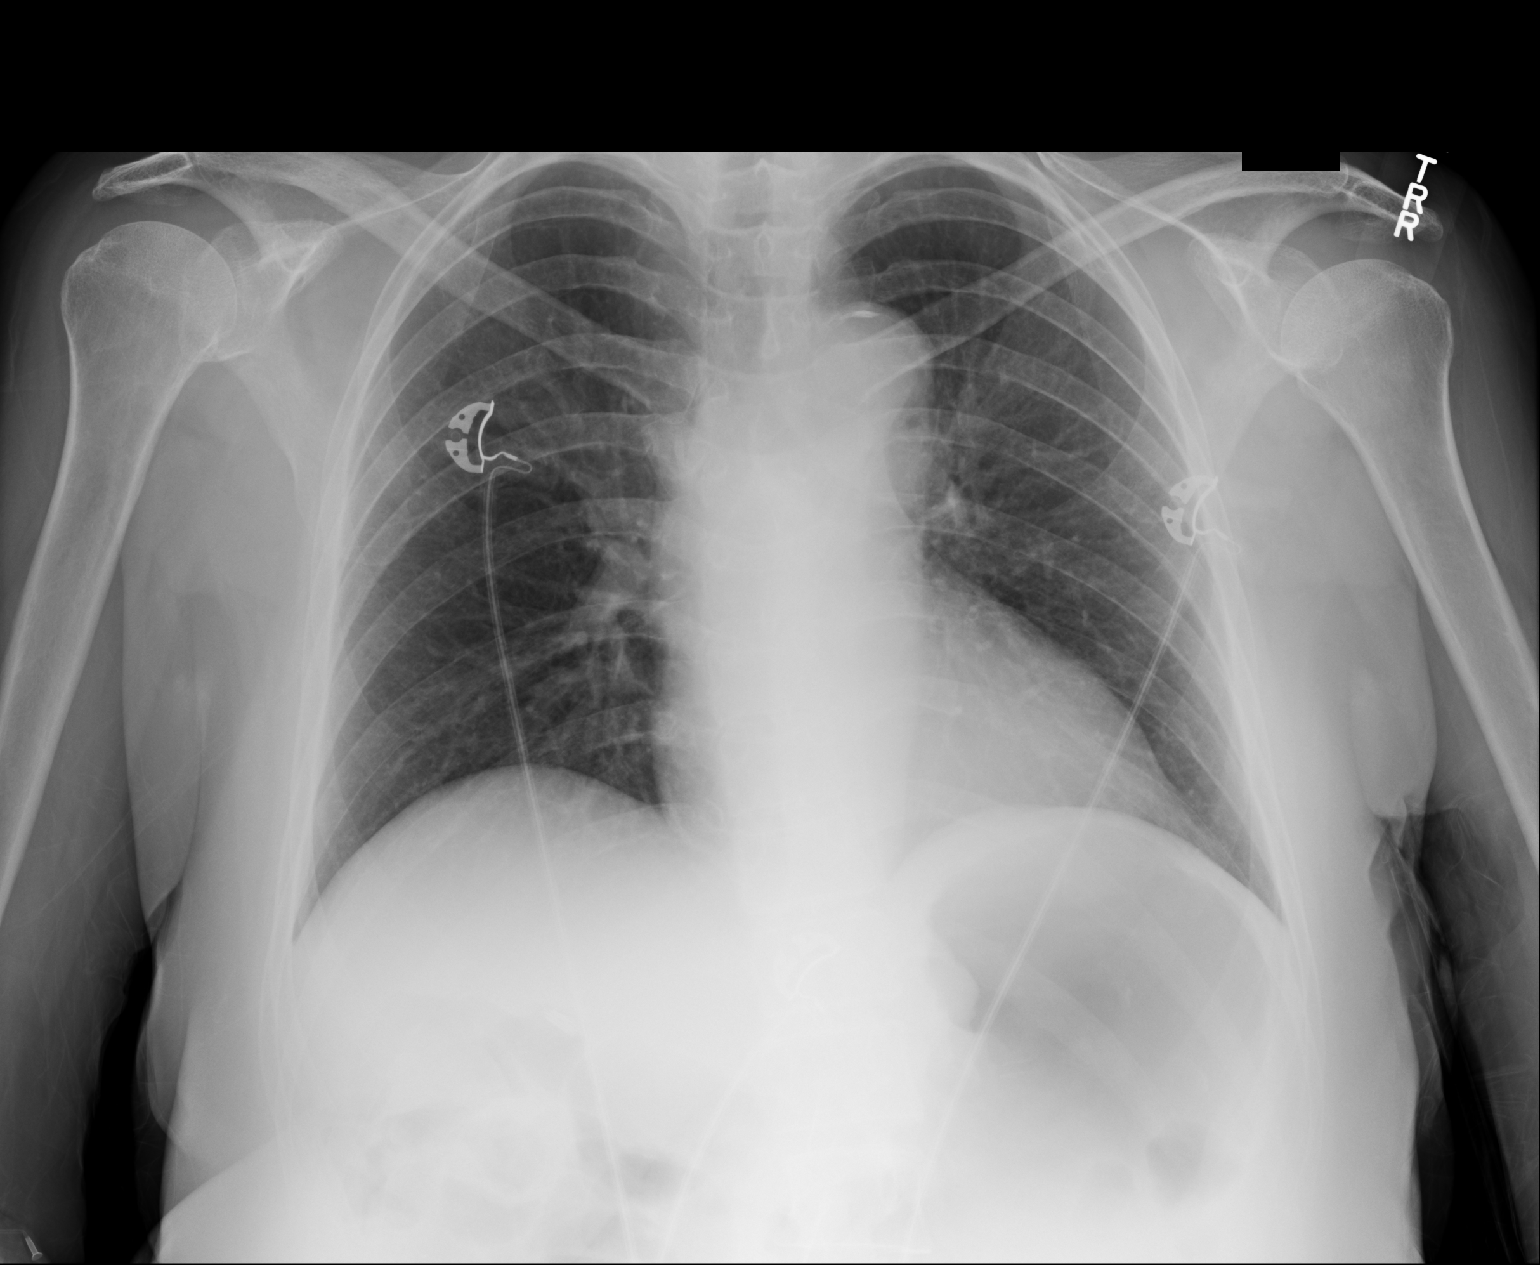

[1 of 1 positions shown; findings below may reference images not displayed]

FINDINGS: Shallow inspiration. Normal heart size and pulmonary vascularity. No
focal airspace disease or consolidation in the lungs. No blunting of
costophrenic angles. No pneumothorax. Mediastinal contours appear
intact. Calcification of the aorta.
IMPRESSION: No active disease.

## 2016-08-31 IMAGING — CR DG ABDOMEN 1V
1 series · 1 of 1 positions shown · non-contrast
Comparison: Upper GI 04/27/2014

CLINICAL DATA: Abdominal pain with nausea and vomiting for 3 days.
History of diverticulitis, esophagitis, cirrhosis, diabetes,
pancreatitis, GERD.

EXAM:
ABDOMEN - 1 VIEW

[ap]
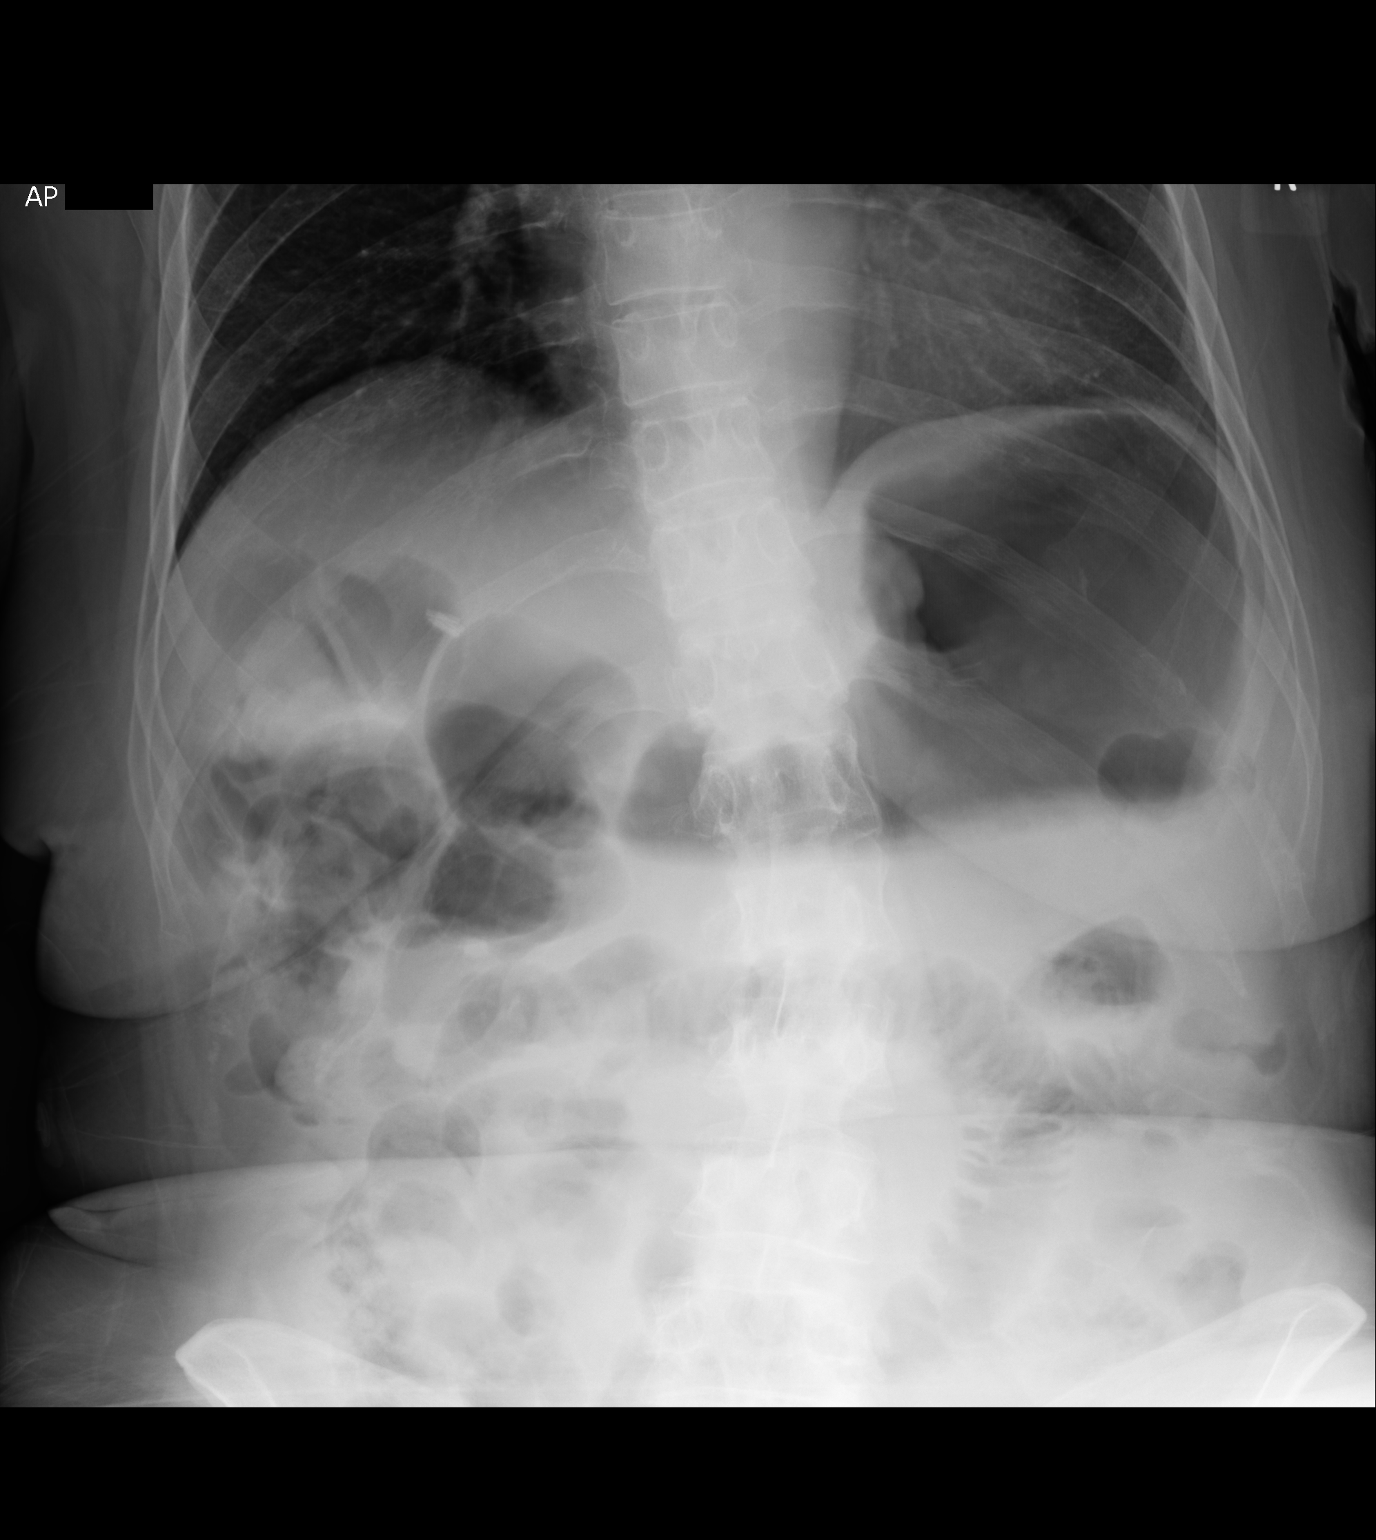

[1 of 1 positions shown; findings below may reference images not displayed]

FINDINGS: A single upright view of the abdomen is obtained. The pelvis is not
included within the field of view. There is no free intra-abdominal
air. Mild gaseous distention of a central small bowel loop is
suggested. Gas and stool within the visualize colon without obvious
distention. Findings may represent enteritis although early or
partial obstruction is not excluded. Mild gaseous distention of the
stomach.
IMPRESSION: No free intra-abdominal air. Mildly dilated gas-filled of central
abdominal small bowel may represent enteritis or partial
obstruction.

## 2016-09-17 ENCOUNTER — Encounter: Payer: Self-pay | Admitting: Primary Care

## 2016-09-17 ENCOUNTER — Other Ambulatory Visit: Payer: Self-pay | Admitting: Primary Care

## 2016-09-17 ENCOUNTER — Ambulatory Visit (INDEPENDENT_AMBULATORY_CARE_PROVIDER_SITE_OTHER): Payer: Commercial Managed Care - HMO | Admitting: Primary Care

## 2016-09-17 VITALS — BP 142/78 | HR 116 | Temp 98.1°F | Ht 63.0 in | Wt 138.8 lb

## 2016-09-17 DIAGNOSIS — I480 Paroxysmal atrial fibrillation: Secondary | ICD-10-CM | POA: Diagnosis not present

## 2016-09-17 DIAGNOSIS — R809 Proteinuria, unspecified: Principal | ICD-10-CM

## 2016-09-17 DIAGNOSIS — E785 Hyperlipidemia, unspecified: Secondary | ICD-10-CM

## 2016-09-17 DIAGNOSIS — G894 Chronic pain syndrome: Secondary | ICD-10-CM

## 2016-09-17 DIAGNOSIS — M545 Low back pain: Secondary | ICD-10-CM | POA: Diagnosis not present

## 2016-09-17 DIAGNOSIS — N201 Calculus of ureter: Secondary | ICD-10-CM

## 2016-09-17 DIAGNOSIS — I10 Essential (primary) hypertension: Secondary | ICD-10-CM

## 2016-09-17 DIAGNOSIS — R110 Nausea: Secondary | ICD-10-CM | POA: Diagnosis not present

## 2016-09-17 DIAGNOSIS — Z794 Long term (current) use of insulin: Secondary | ICD-10-CM

## 2016-09-17 DIAGNOSIS — E119 Type 2 diabetes mellitus without complications: Secondary | ICD-10-CM

## 2016-09-17 DIAGNOSIS — K219 Gastro-esophageal reflux disease without esophagitis: Secondary | ICD-10-CM

## 2016-09-17 DIAGNOSIS — IMO0001 Reserved for inherently not codable concepts without codable children: Secondary | ICD-10-CM

## 2016-09-17 DIAGNOSIS — E1129 Type 2 diabetes mellitus with other diabetic kidney complication: Secondary | ICD-10-CM

## 2016-09-17 DIAGNOSIS — T462X1A Poisoning by other antidysrhythmic drugs, accidental (unintentional), initial encounter: Secondary | ICD-10-CM

## 2016-09-17 DIAGNOSIS — R1084 Generalized abdominal pain: Secondary | ICD-10-CM

## 2016-09-17 DIAGNOSIS — E039 Hypothyroidism, unspecified: Secondary | ICD-10-CM

## 2016-09-17 DIAGNOSIS — G8929 Other chronic pain: Secondary | ICD-10-CM

## 2016-09-17 DIAGNOSIS — E1165 Type 2 diabetes mellitus with hyperglycemia: Secondary | ICD-10-CM

## 2016-09-17 DIAGNOSIS — E032 Hypothyroidism due to medicaments and other exogenous substances: Secondary | ICD-10-CM

## 2016-09-17 LAB — LIPID PANEL
CHOL/HDL RATIO: 3
CHOLESTEROL: 171 mg/dL (ref 0–200)
HDL: 63.7 mg/dL (ref >39.00)
LDL CALC: 80 mg/dL (ref 0–99)
NonHDL: 106.8
TRIGLYCERIDES: 135 mg/dL (ref 0.0–149.0)
VLDL: 27 mg/dL (ref 0.0–40.0)

## 2016-09-17 LAB — BASIC METABOLIC PANEL
BUN: 15 mg/dL (ref 6–23)
CO2: 28 mEq/L (ref 19–32)
CREATININE: 0.7 mg/dL (ref 0.40–1.20)
Calcium: 8.9 mg/dL (ref 8.4–10.5)
Chloride: 105 mEq/L (ref 96–112)
GFR: 89.91 mL/min (ref >60.00)
GLUCOSE: 207 mg/dL — AB (ref 70–99)
Potassium: 3.8 mEq/L (ref 3.5–5.1)
Sodium: 138 mEq/L (ref 135–145)

## 2016-09-17 LAB — HEMOGLOBIN A1C: Hgb A1c MFr Bld: 8.3 % — ABNORMAL HIGH (ref 4.6–6.5)

## 2016-09-17 LAB — MICROALBUMIN / CREATININE URINE RATIO
Creatinine,U: 163.5 mg/dL
Microalb Creat Ratio: 55.7 mg/g — ABNORMAL HIGH (ref 0.0–30.0)
Microalb, Ur: 91 mg/dL — ABNORMAL HIGH (ref 0.0–1.9)

## 2016-09-17 MED ORDER — INSULIN ASPART 100 UNIT/ML FLEXPEN: PEN_INJECTOR | SUBCUTANEOUS | 11 refills | Status: DC

## 2016-09-17 MED ORDER — METOPROLOL TARTRATE 25 MG PO TABS: 25.0000 mg | ORAL_TABLET | Freq: Two times a day (BID) | ORAL | 3 refills | Status: DC

## 2016-09-17 MED ORDER — ATORVASTATIN CALCIUM 40 MG PO TABS: 40.0000 mg | ORAL_TABLET | Freq: Every day | ORAL | 3 refills | Status: DC

## 2016-09-17 MED ORDER — LEVOTHYROXINE SODIUM 75 MCG PO TABS: 75.0000 ug | ORAL_TABLET | Freq: Every day | ORAL | 2 refills | Status: DC

## 2016-09-17 MED ORDER — LISINOPRIL 5 MG PO TABS: ORAL_TABLET | ORAL | 1 refills | Status: DC

## 2016-09-17 MED ORDER — METOCLOPRAMIDE HCL 10 MG PO TABS: 10.0000 mg | ORAL_TABLET | Freq: Three times a day (TID) | ORAL | 0 refills | Status: DC | PRN

## 2016-09-17 MED ORDER — TIZANIDINE HCL 4 MG PO TABS: 4.0000 mg | ORAL_TABLET | Freq: Three times a day (TID) | ORAL | 0 refills | Status: DC | PRN

## 2016-09-17 MED ORDER — INSULIN DETEMIR 100 UNIT/ML FLEXPEN: 12.0000 [IU] | PEN_INJECTOR | Freq: Every evening | SUBCUTANEOUS | 11 refills | Status: DC

## 2016-09-17 MED ORDER — PANTOPRAZOLE SODIUM 40 MG PO TBEC: 40.0000 mg | DELAYED_RELEASE_TABLET | Freq: Every day | ORAL | 2 refills | Status: DC

## 2016-09-17 MED ORDER — APIXABAN 5 MG PO TABS: 5.0000 mg | ORAL_TABLET | Freq: Two times a day (BID) | ORAL | 2 refills | Status: DC

## 2016-09-17 NOTE — Patient Instructions (Addendum)
Complete lab work prior to leaving today. I will notify you of your results once received.   I sent refills of most of your medication to your pharmacy. I will refill your Levemir and Novolog once I receive your A1C results.  You will be contacted regarding your referral to the pain clinic.  Please let us know if you have not heard back within one week.   Please schedule a follow up appointment with your Urologist as soon as possible.   Follow up with me in 1 month for re-evaluation of your blood pressure.  It was a pleasure to see you today!

## 2016-09-17 NOTE — Progress Notes (Signed)
Pre visit review using our clinic review tool, if applicable. No additional management support is needed unless otherwise documented below in the visit note. 

## 2016-09-17 NOTE — Assessment & Plan Note (Signed)
Chronic, managed on PPI. History of cirrhosis and chronic nausea. Recent LFT's stable. Continue to monitor.

## 2016-09-17 NOTE — Assessment & Plan Note (Signed)
Managed on statin, no recent lipid panel on file. Lipids pending today. Recent LFT's stable.

## 2016-09-17 NOTE — Assessment & Plan Note (Signed)
Managed on oxycodone and morphine for 10+ years. Endorses being out of her medications for the past sevearal days, however, after review of the [place] controlled substance database, it looks like she's not refilled these medications since November 2017. Discussed that I will not provide refills, referral placed to pain management for alternative treatment.

## 2016-09-17 NOTE — Assessment & Plan Note (Signed)
Managed on lopressor and eliquis. Refills provided today.

## 2016-09-17 NOTE — Assessment & Plan Note (Signed)
Managed on Lopressor BID. Once managed on ACE, not sure why this was discontinued. Urine microalbumin pending today, will need to re-initiate ACE if this is positive. Suspect BP will improve once she restarts lopressor. Follow up in 1 month for BP check.

## 2016-09-17 NOTE — Assessment & Plan Note (Addendum)
A1C in October 2017 above goal, repeat today. Continue insulin, may need to adjust, will await labs. Will send in refills for Levemir and Novolog once labs received. Also discussed to check sugars 3-4 times daily. Urine microalbumin pending, may require ACE. Managed on statin.

## 2016-09-17 NOTE — Assessment & Plan Note (Signed)
TSH in September 2017 stable, refilled levothyroxine.

## 2016-09-17 NOTE — Assessment & Plan Note (Signed)
Following with Urology, no recent follow up since stent placement in November 2017. Strongly encouraged she schedule an appointment soon.

## 2016-09-17 NOTE — Progress Notes (Signed)
Subjective:    Patient ID: [name], female    DOB: December 16, 1955, 61 y.o.   MRN: [id number]  HPI  [name] is a 61 year old female who presents today for follow up of chronic conditions. She was last seen in our practice in October 2016 and has not returned for follow up as recommended. She has a history of multiple hospitalizations without proper follow up due to recurrent "no-show" or canceled appointments with PCP.  1) Type 2 Diabetes: Currently managed on Levemir 7 units HS, Novolog 5 units TID. Her last A1C was 8.1 in October 2017. She's checking her blood sugars fasting once daily which run 110-120. She denies low's below 70. She does get readings of 400's once monthly on average. She endorses a poor diet as she doesn't eat much. Sometimes she eats cereal, eggs, frozen dinners. She is needing a refill of her insulin and is requesting the pen injectors.  2) Hypothyroidism: Currently managed on levothyroxine 75 mcg tablets. Her last TSH was normal in September 2017. She is requesting a refill of her medication.  3) Renal Stones: History of numerous stones to right side. UPJ stone with obstruction in November 2017 with stent placement. She is currently following with [name] and still has her Stent in place. She's not had time to follow back up with her Urologist but plans on doing this soon. She has noticed intermittent hematuria.  4) Chronic Abdominal Pain: History of cirrhosis and chronic nausea. Also with viral gastroenteritis. She's managed on Reglan for nausea for which she's taken for years. LFT's in November 2017 stable. She does not follow with GI. She denies abdominal pain, vomiting, fevers.  5) Paroxysmal Atrial Fibrillation: Currently managed on apixaban 5 mg BID and metoprolol tartrate 25 mg BID (rate control). Her heart rate in the office today is 116. She ran out of her metoprolol 5 days ago and is needing a refill today. She denies chest pain.  6) COPD:  Currently managed on Symbicort and Albuterol. She continues to smoke. She feels well managed on her current regimen.  7) Chronic Back Pain: Chronic narcotic use for the past 10 years. Managed on Oxycodone 10 mg every 4 hours and Morphine 30 mg every 6 hours. She also takes tizanidine three times daily. She has been out of her medication for the past 5-6 days and is requesting refills. She has been getting refills from [name] and does not currently follow with the pain clinic.  Review of Systems  Constitutional: Positive for fatigue. Negative for fever.  Respiratory: Negative for shortness of breath.   Cardiovascular: Negative for chest pain and palpitations.  Gastrointestinal: Negative for abdominal pain.  Musculoskeletal: Positive for arthralgias and back pain.  Neurological: Negative for numbness.       Past Medical History:  Diagnosis Date  . Allergy   . Anxiety   . Cancer (HCC)    HX OF CANCER OF UTERUS   . Cirrhosis of liver not due to alcohol ([place]) 2016  . Degenerative disk disease   . Diverticulitis   . Gastroparesis   . GERD (gastroesophageal reflux disease)   . History of hiatal hernia   . Hypertension   . Hypothyroid   . Intussusception intestine ([place]) 05/2015  . PAF (paroxysmal atrial fibrillation) ([place]) 03/2015   a. new onset 03/2015 in setting of intractable N/V; b. on Eliquis 5 mg bid; c. CHADSVASc 4 (DM, TIA x 2, female)  . Pancreatitis   . Sick  sinus syndrome ([place])   . Stomach ulcer   . Stroke [name])    with minimal left sided weakness  . Syncope 01/2015  . TIA (transient ischemic attack) 02/2015  . Type 1 diabetes ([place])    on levemir     Social History   Social History  . Marital status: Married    Spouse name: N/A  . Number of children: N/A  . Years of education: N/A   Occupational History  . Disabled 2nd back problems    Social History Main Topics  . Smoking status: Never Smoker  . Smokeless tobacco: Never Used  . Alcohol use No  .  Drug use: No  . Sexual activity: No   Other Topics Concern  . Not on file   Social History Narrative   Lives in [place], [place] with her husband and 2 sons.    Past Surgical History:  Procedure Laterality Date  . ABDOMINAL HYSTERECTOMY    . CARDIAC CATHETERIZATION N/A 01/12/2016   Procedure: Left Heart Cath and Coronary Angiography;  Surgeon: Wellington Hampshire, MD;  Location: [facility];  Service: Cardiovascular;  Laterality: N/A;  . CHOLECYSTECTOMY    . CYSTOSCOPY/URETEROSCOPY/HOLMIUM LASER Right 07/14/2016   Procedure: CYSTOSCOPY/URETEROSCOPY/HOLMIUM LASER;  Surgeon: Alexis Frock, MD;  Location: [facility];  Service: Urology;  Laterality: Right;  . ESOPHAGOGASTRODUODENOSCOPY N/A 04/04/2015   Procedure: ESOPHAGOGASTRODUODENOSCOPY (EGD);  Surgeon: Hulen Luster, MD;  Location[name];  Service: Endoscopy;  Laterality: N/A;  . ESOPHAGOGASTRODUODENOSCOPY (EGD) WITH PROPOFOL N/A 01/18/2016   Procedure: ESOPHAGOGASTRODUODENOSCOPY (EGD) WITH PROPOFOL;  Surgeon: Lucilla Lame, MD;  Location: [facility];  Service: Endoscopy;  Laterality: N/A;  . FLEXIBLE SIGMOIDOSCOPY N/A 01/18/2016   Procedure: FLEXIBLE SIGMOIDOSCOPY;  Surgeon: Lucilla Lame, MD;  Location: [facility];  Service: Endoscopy;  Laterality: N/A;  . HERNIA REPAIR      Family History  Problem Relation Age of Onset  . Hypertension Mother   . CAD Sister   . Heart attack Sister     Deceased 2014/11/12  . CAD Brother     Allergies  Allergen Reactions  . Hydrocodone Other (See Comments)    Pt states that this medication caused cirrhosis of the liver.    . Aspirin   . Erythromycin Other (See Comments)    Reaction:  Fever   . Prednisone Other (See Comments)    Reaction:  Unknown   . Rosiglitazone Maleate Swelling  . Codeine Sulfate Rash  . Tetanus-Diphtheria Toxoids Td Rash and Other (See Comments)    Reaction:  Fever     Current Outpatient Prescriptions on File Prior to Visit  Medication Sig Dispense Refill  .  acidophilus (RISAQUAD) CAPS capsule Take 2 capsules by mouth daily. 60 capsule 0  . albuterol (PROVENTIL HFA;VENTOLIN HFA) 108 (90 Base) MCG/ACT inhaler Inhale 2 puffs into the lungs every 6 (six) hours as needed for wheezing or shortness of breath. 1 Inhaler 2  . budesonide-formoterol (SYMBICORT) 160-4.5 MCG/ACT inhaler Inhale 2 puffs into the lungs 2 (two) times daily. 1 Inhaler 1  . insulin aspart (NOVOLOG) 100 UNIT/ML injection Inject 5 Units into the skin 3 (three) times daily with meals as needed for high blood sugar. Pt uses as needed per sliding scale. 10 mL 11  . insulin detemir (LEVEMIR) 100 UNIT/ML injection Inject 0.07 mLs (7 Units total) into the skin at bedtime. 10 mL 11  . ondansetron (ZOFRAN ODT) 4 MG disintegrating tablet Take 1 tablet (4 mg total) by mouth every 8 (eight)  hours as needed for nausea or vomiting. 20 tablet 0  . oxyCODONE (OXY IR/ROXICODONE) 5 MG immediate release tablet Take 2 tablets (10 mg total) by mouth every 4 (four) hours as needed for moderate pain. 15 tablet 0   No current facility-administered medications on file prior to visit.     BP (!) 142/78   Pulse (!) 116   Temp 98.1 F (36.7 C) (Oral)   Ht 5' 3"  (1.6 m)   Wt 138 lb 12.8 oz (63 kg)   SpO2 99%   BMI 24.59 kg/m    Objective:   Physical Exam  Constitutional: She is oriented to person, place, and time. She appears well-nourished.  Neck: Neck supple.  Cardiovascular: Normal rate and regular rhythm.   Pulmonary/Chest: Effort normal and breath sounds normal.  Abdominal: Soft.  Neurological: She is alert and oriented to person, place, and time.  Skin: Skin is warm and dry.  Psychiatric: She has a normal mood and affect.          Assessment & Plan:  Non Compliance:  Long discussion today regarding the importance of regular PCP follow up in order to prevent recurrent hospitalizations and to improve general health. She verbalized understanding and agrees.  Will see her in 1 month for  BP check, she understands.

## 2016-09-17 NOTE — Assessment & Plan Note (Signed)
Managed on protonix 40 mg BID? Will reduce down to 40 mg once daily, with a goal of weaning down further if possible. Refill sent to pharmacy.

## 2016-10-15 ENCOUNTER — Ambulatory Visit: Payer: Commercial Managed Care - HMO | Admitting: Primary Care

## 2016-10-17 ENCOUNTER — Ambulatory Visit: Payer: Commercial Managed Care - HMO | Admitting: Primary Care

## 2016-10-23 ENCOUNTER — Ambulatory Visit: Payer: Commercial Managed Care - HMO | Admitting: Primary Care

## 2016-10-30 ENCOUNTER — Ambulatory Visit: Payer: Commercial Managed Care - HMO | Admitting: Primary Care

## 2016-10-31 ENCOUNTER — Telehealth: Payer: Self-pay | Admitting: Primary Care

## 2016-10-31 NOTE — Telephone Encounter (Signed)
Noted, will you please notify patient? Are there any other options for pain management?

## 2016-10-31 NOTE — Telephone Encounter (Signed)
Center for pain denied patient  (.Please be advised this referral is denied as it not appropriate for our clinic. Thank you for considering [name] for your patients care.) Please advise

## 2016-11-01 NOTE — Telephone Encounter (Signed)
Pt left v/m Robin at [facility] trying to get pt appt at pain clinic but no appt yet due to pts ins. Pt request pain med; pt is not sleeping and pt is in pain also having back pain with muscle spasms and needs muscle relaxers and request pain med until get appt with pain clinic.pt request [email or web address]

## 2016-11-01 NOTE — Telephone Encounter (Signed)
Spoke to pt she is aware I will try pain management in [place]

## 2016-11-01 NOTE — Telephone Encounter (Signed)
Please notify patient that she has not followed up in our office as recommended and needs re-evaluation of her blood pressure.  I'm sorry to hear that she's having difficulty with pain management, but I will be unable to refill her narcotic pain medications. I will be happy to refill her muscle relaxer, but she will need to come in for follow up for her BP. Please schedule her for follow up.

## 2016-12-03 IMAGING — DX DG CHEST 2V
2 series · 2 of 2 positions shown · non-contrast
Comparison: 01/14/2012

CLINICAL DATA: Syncope and lethargy, 1 day duration

EXAM:
CHEST  2 VIEW

[chest lat]
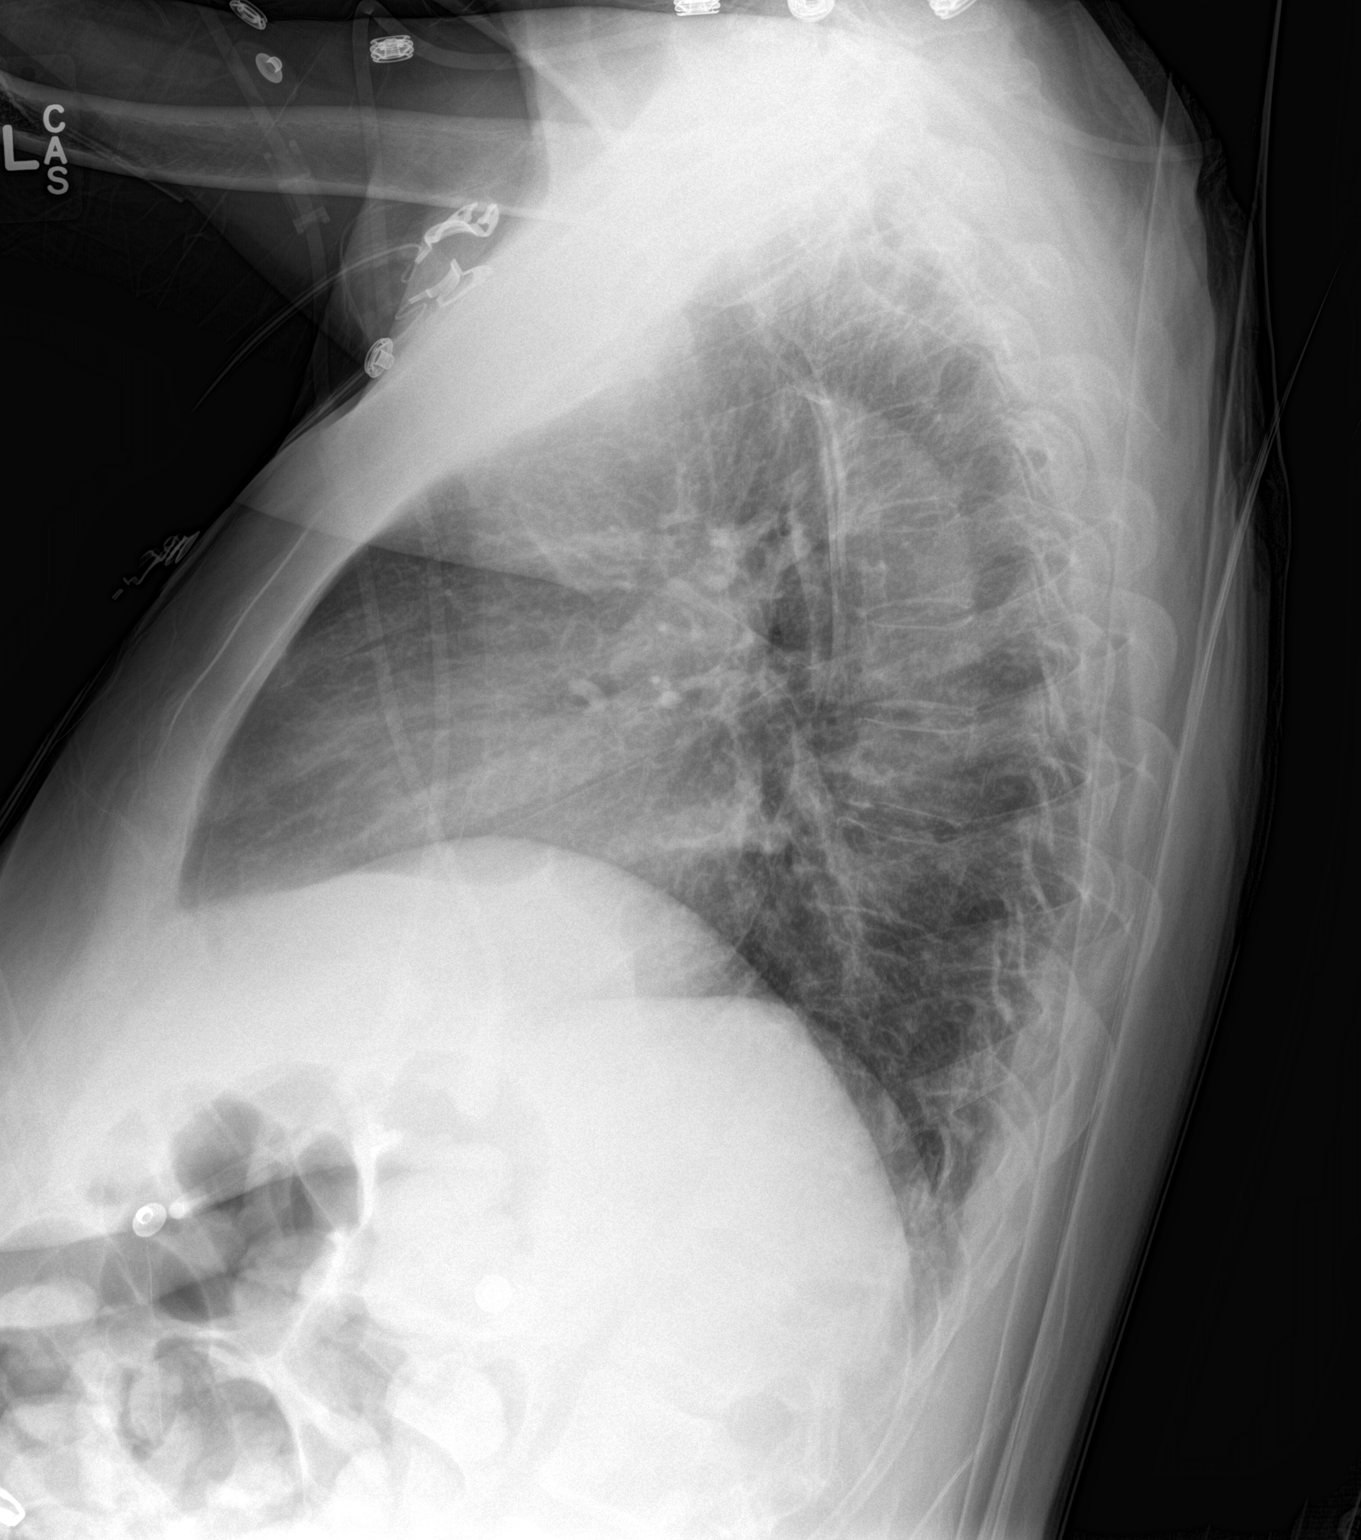

[chest ap]
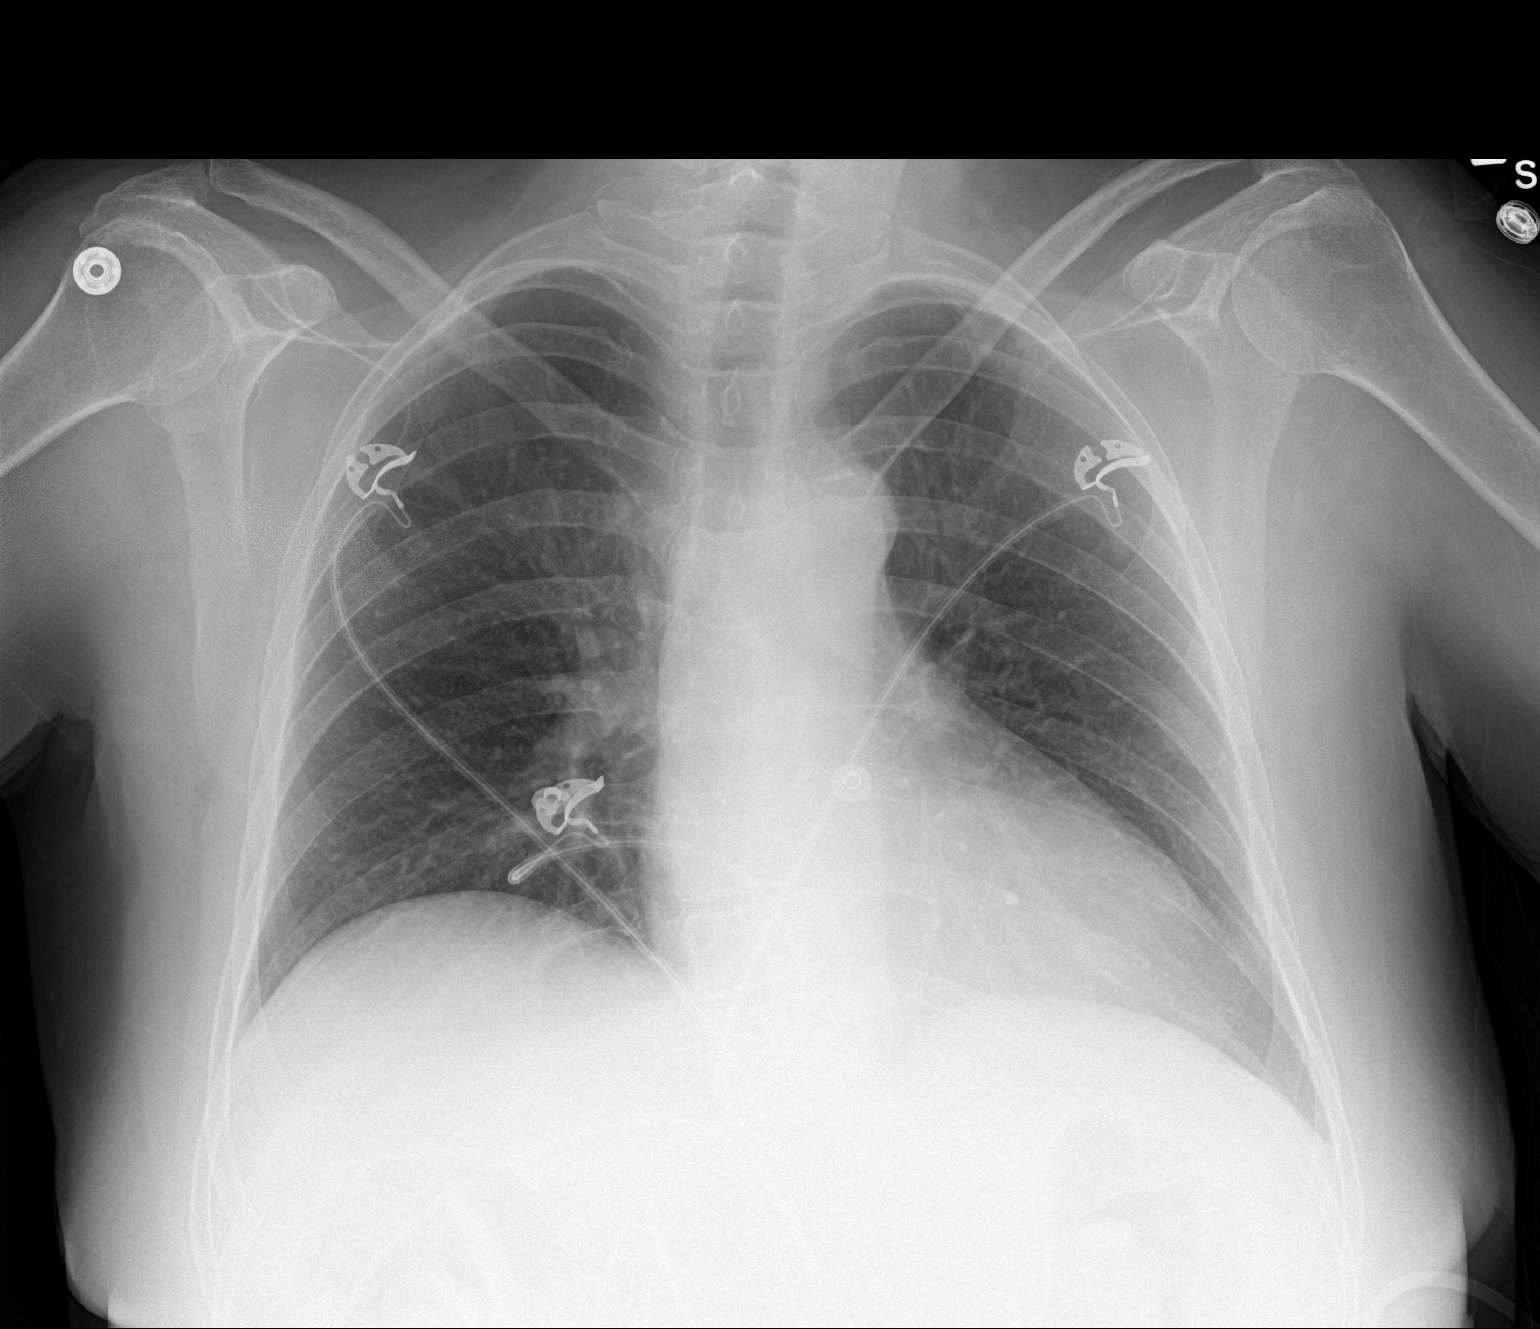

[2 of 2 positions shown; findings below may reference images not displayed]

FINDINGS: The heart size and mediastinal contours are within normal limits.
Both lungs are clear. The visualized skeletal structures are
unremarkable.
IMPRESSION: No active cardiopulmonary disease.

## 2016-12-03 IMAGING — CT CT HEAD W/O CM
2 series · 16 of 30 positions shown, 18 images · non-contrast
Comparison: Head CT 11/02/2007

CLINICAL DATA: Syncopal episode new

EXAM:
CT HEAD WITHOUT CONTRAST
TECHNIQUE: Contiguous axial images were obtained from the base of the skull
through the vertex without intravenous contrast.

[Series 201: head w/o, idose (1) · axial · non-contrast · 0.49mm/px · z∈[+62,+167]mm · 8 of 28 slices shown, 10 images]
[im 4/28  brain]
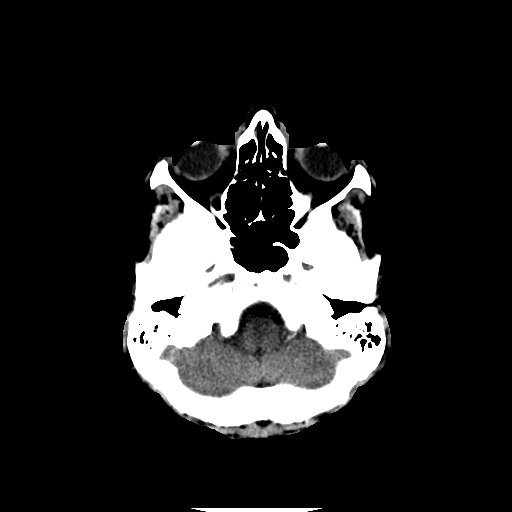
[im 4/28  bone]
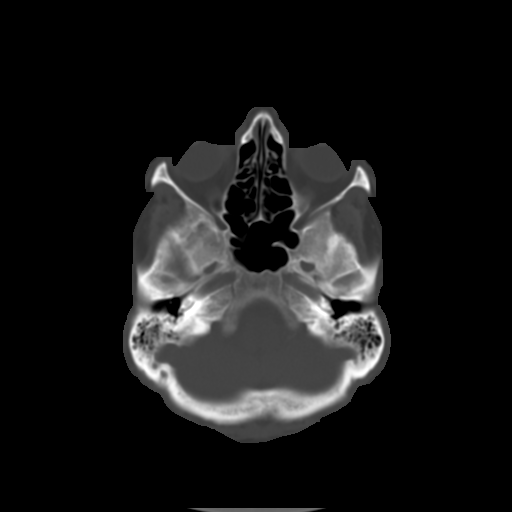
[im 7/28  brain]
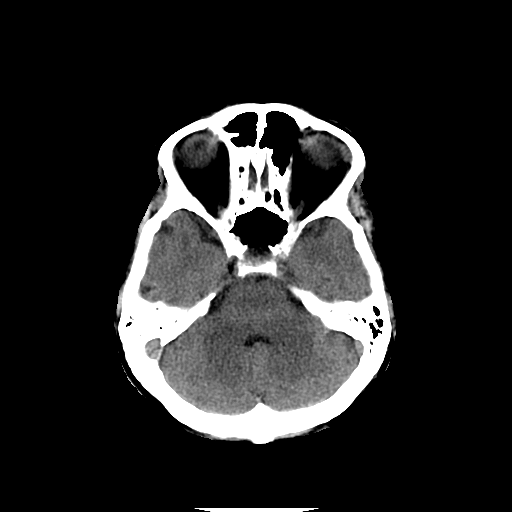
[im 10/28  brain]
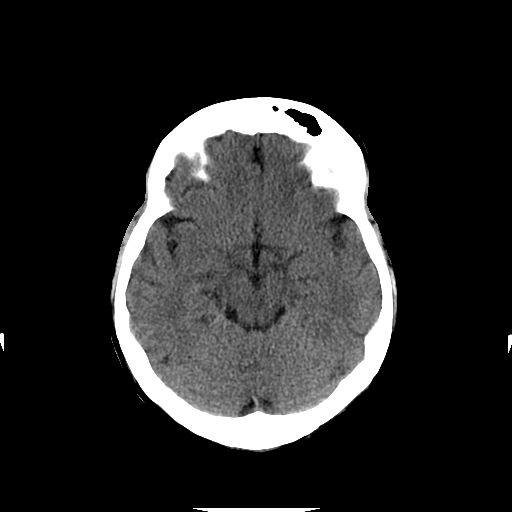
[im 13/28  brain]
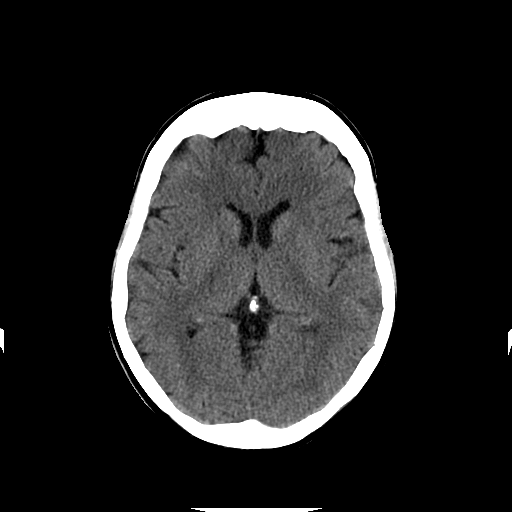
[im 16/28  brain]
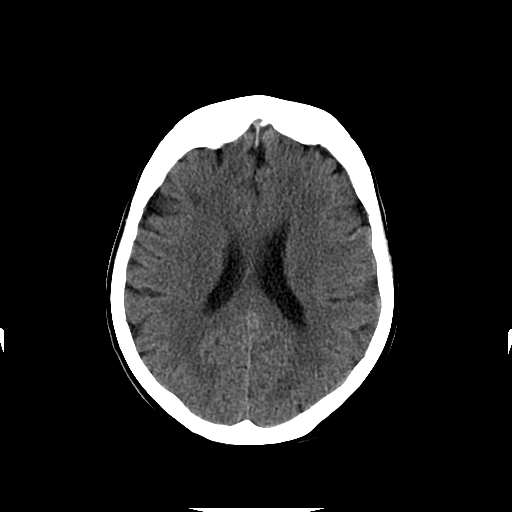
[im 16/28  bone]
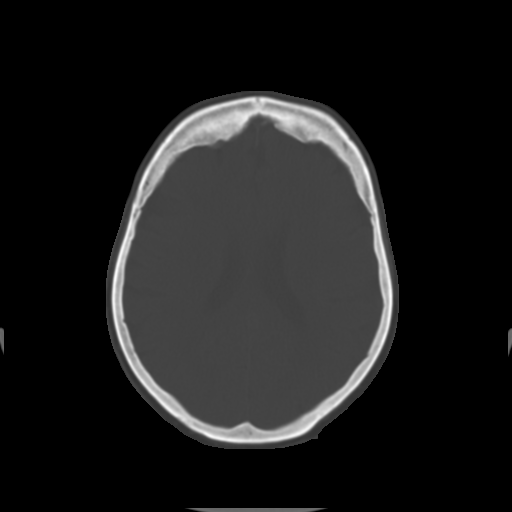
[im 19/28  brain]
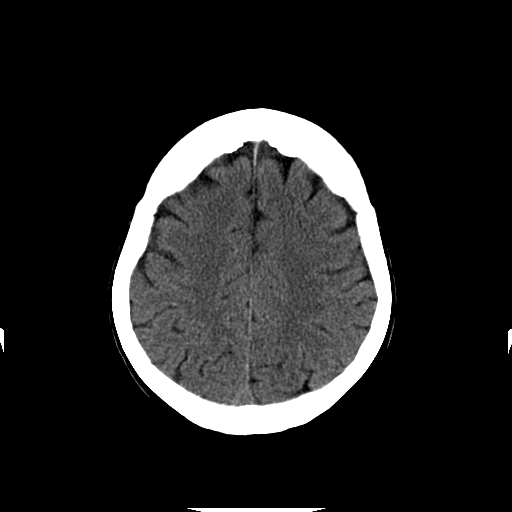
[im 22/28  brain]
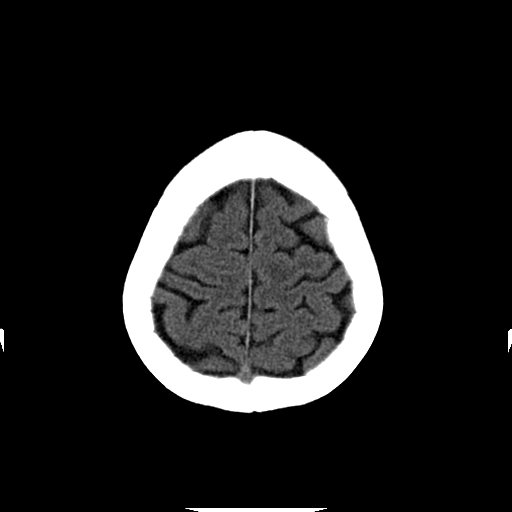
[im 25/28  brain]
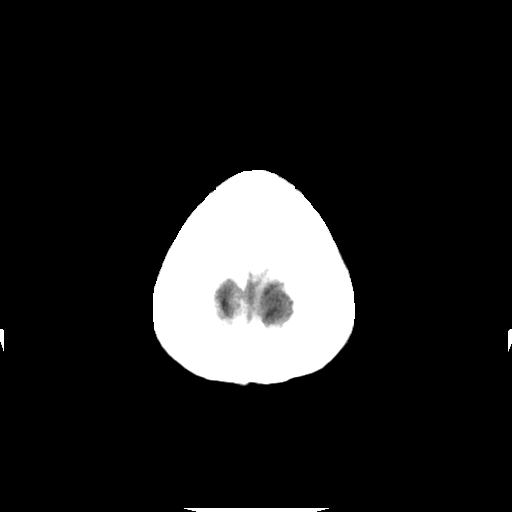

[Series 202: head w/o bone, idose (1) · axial · non-contrast · 0.49mm/px · z∈[+58,+168]mm · 8 of 56 slices shown]
[im 6/56  bone]
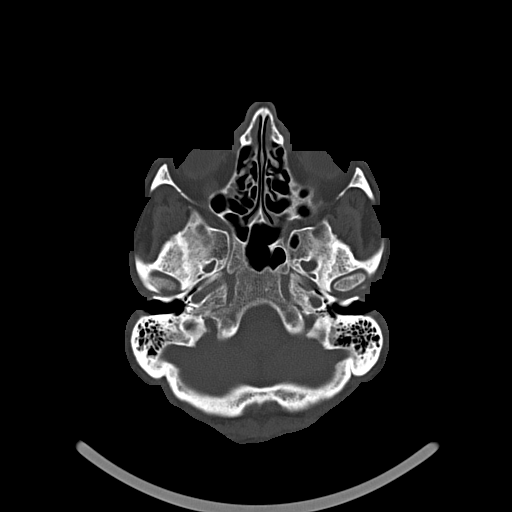
[im 12/56  bone]
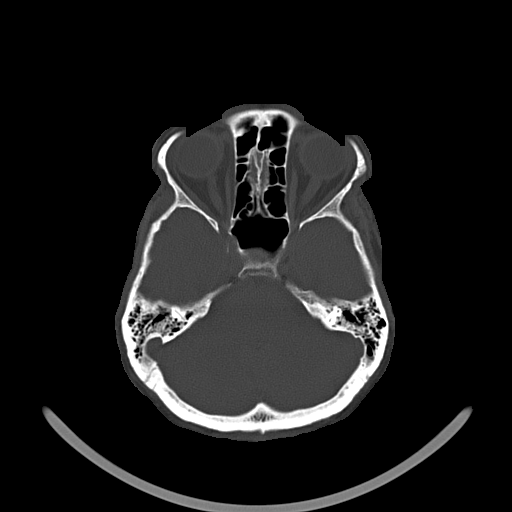
[im 18/56  bone]
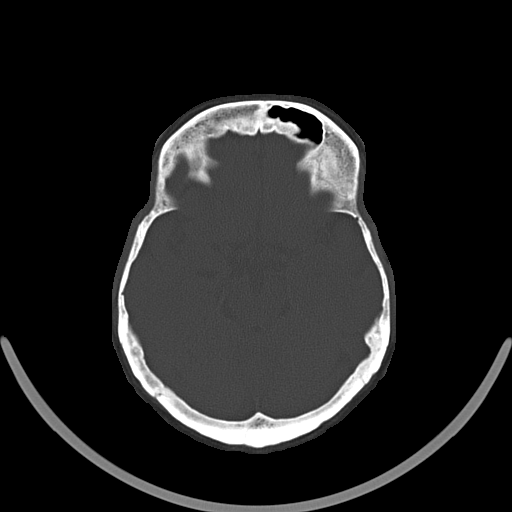
[im 24/56  bone]
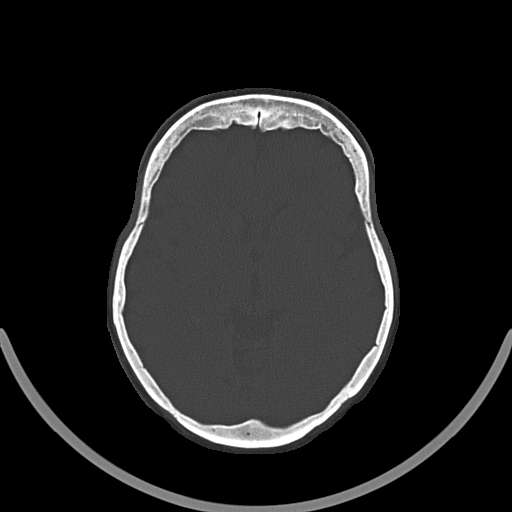
[im 32/56  bone]
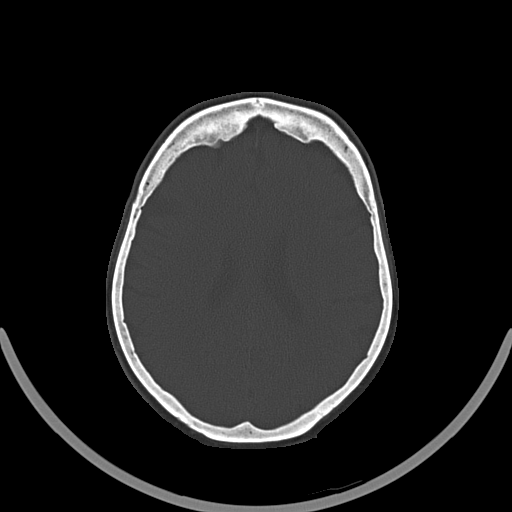
[im 38/56  bone]
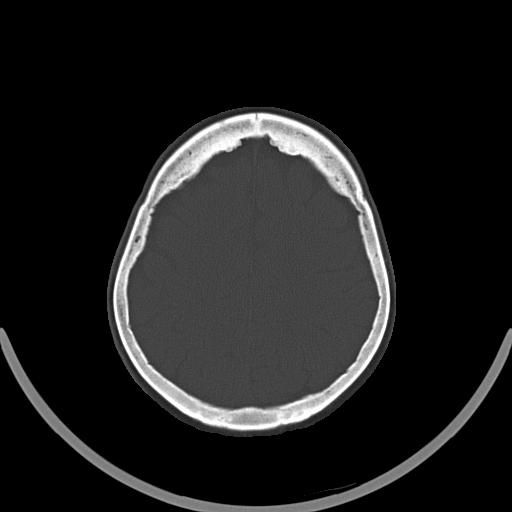
[im 44/56  bone]
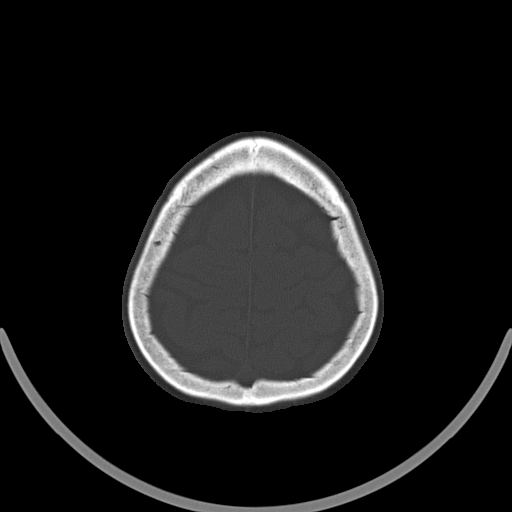
[im 50/56  bone]
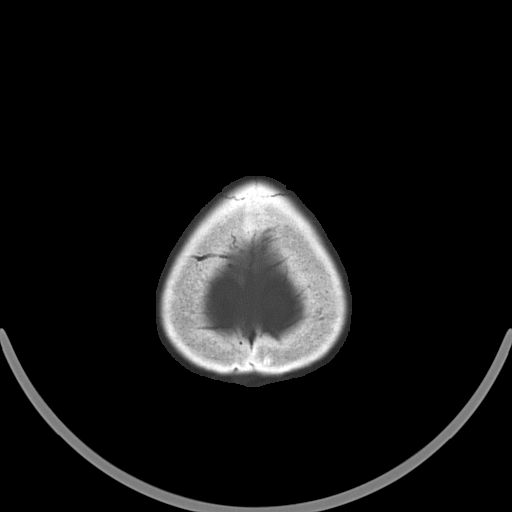

[16 of 30 positions shown; findings below may reference images not displayed]

FINDINGS: No acute intracranial hemorrhage. No focal mass lesion. No CT
evidence of acute infarction. No midline shift or mass effect. No
hydrocephalus. Basilar cisterns are patent. Paranasal sinuses and
mastoid air cells are clear.
IMPRESSION: No acute intracranial findings.  No change from prior

## 2017-01-08 IMAGING — CT CT HEAD W/O CM
1 series · 16 of 30 positions shown, 20 images · non-contrast
Comparison: 10/08/2014 and earlier.

ADDENDUM:
Study discussed by telephone with Dr. DENZU SHANA on 02/15/2015
at 7311 hrs.
CLINICAL DATA: 59-year-old female code stroke. Right extremity
weakness. Symptoms since 1271 hrs. Initial encounter.

EXAM:
CT HEAD WITHOUT CONTRAST
TECHNIQUE: Contiguous axial images were obtained from the base of the skull
through the vertex without intravenous contrast.

[Series 2: head wo · axial · 0.39mm/px · z∈[-178,-52]mm · 16 of 32 slices shown, 20 images]
[im 2/32  brain]
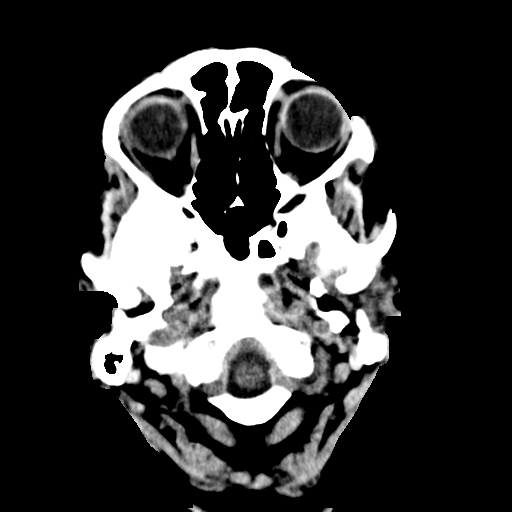
[im 2/32  bone]
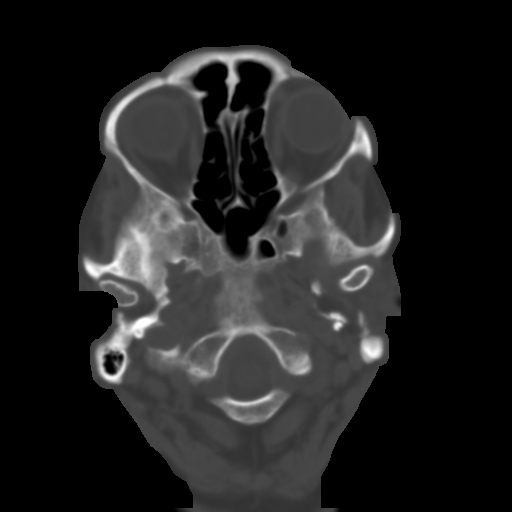
[im 4/32  brain]
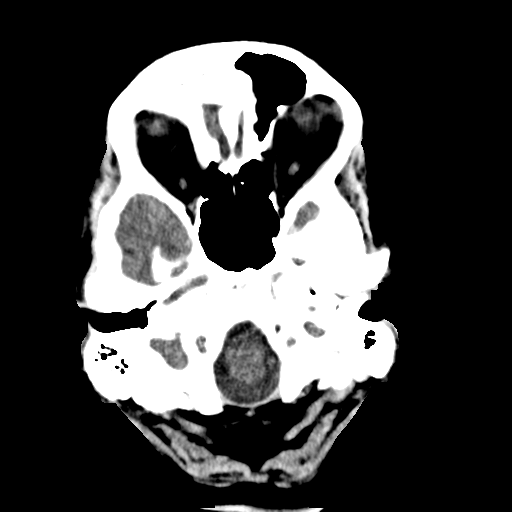
[im 6/32  brain]
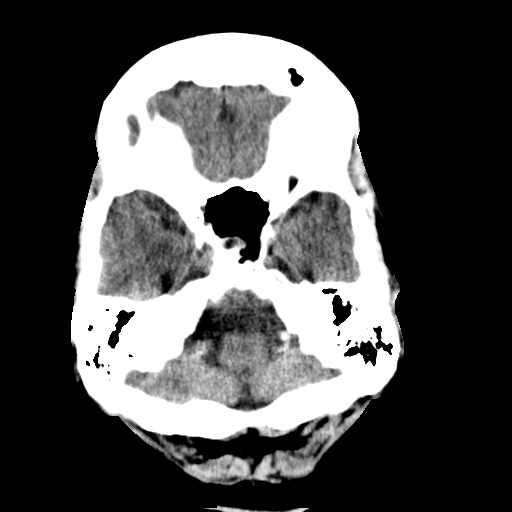
[im 8/32  brain]
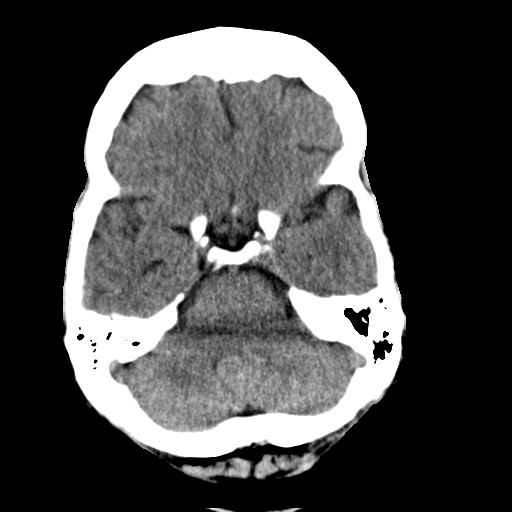
[im 9/32  brain]
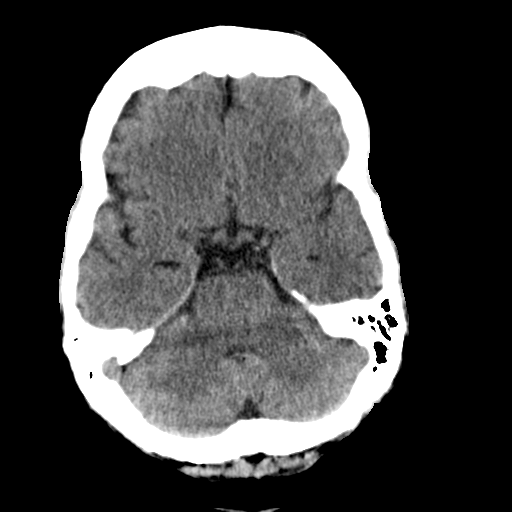
[im 9/32  bone]
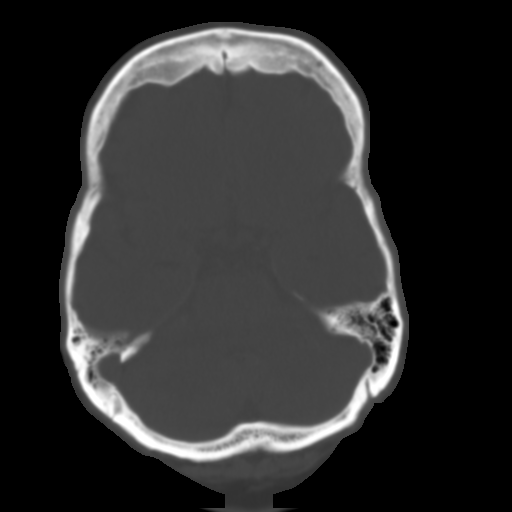
[im 11/32  brain]
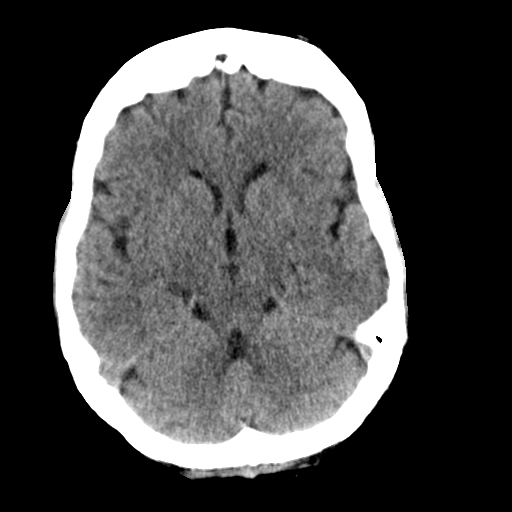
[im 13/32  brain]
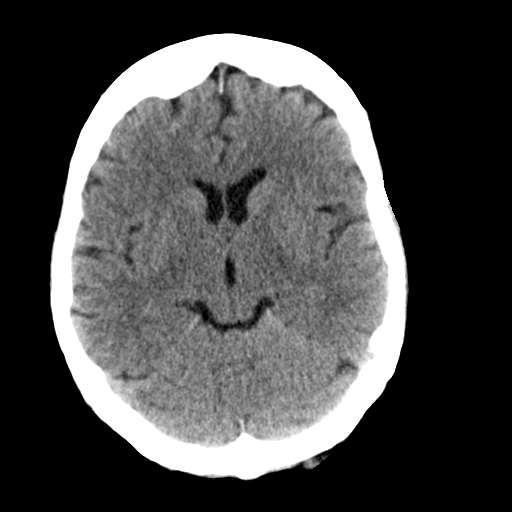
[im 15/32  brain]
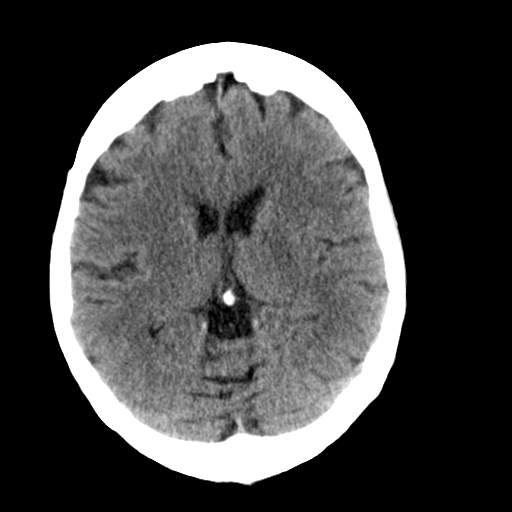
[im 17/32  brain]
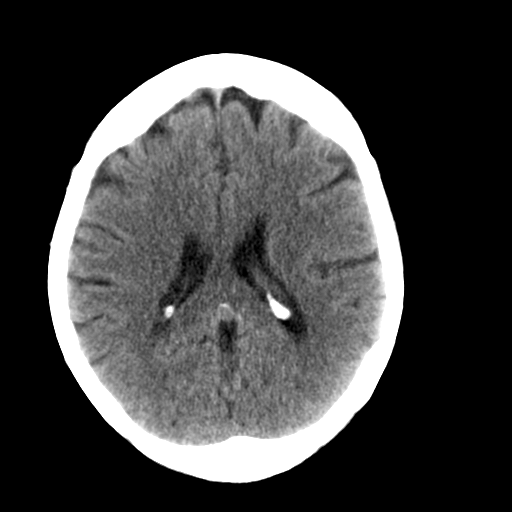
[im 17/32  bone]
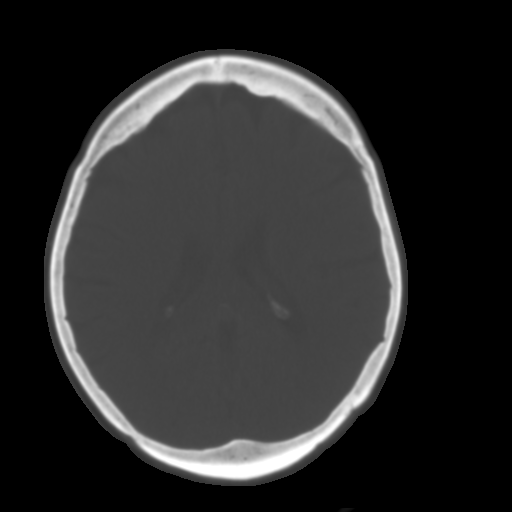
[im 19/32  brain]
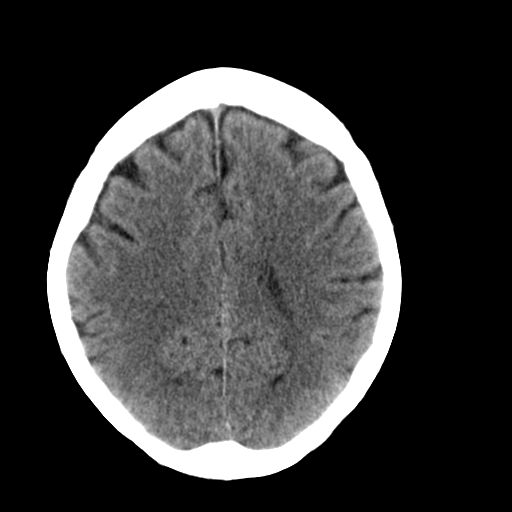
[im 21/32  brain]
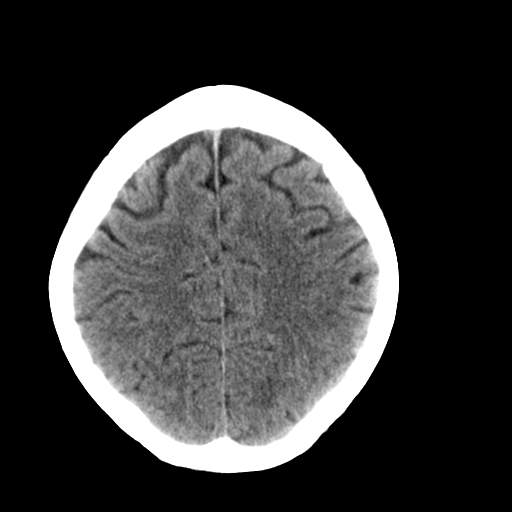
[im 23/32  brain]
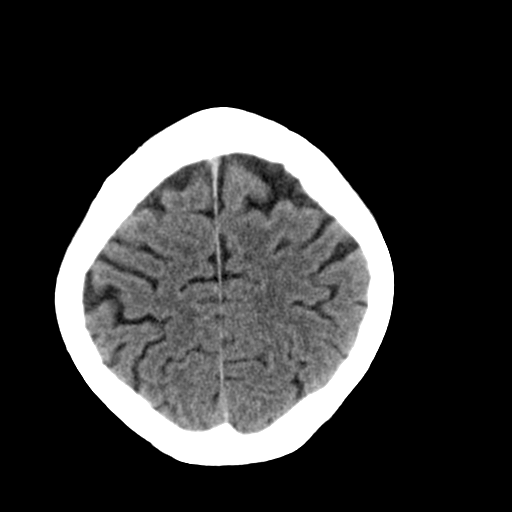
[im 24/32  brain]
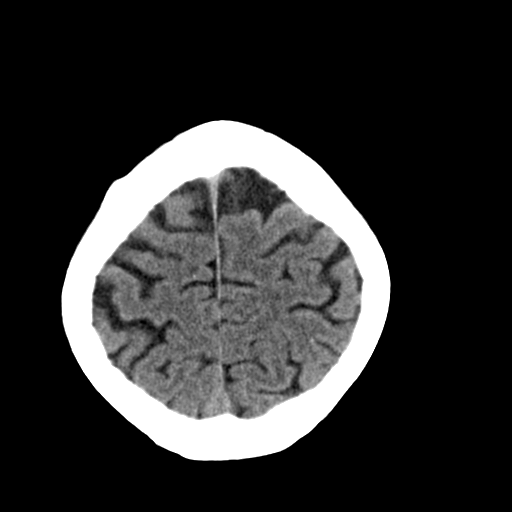
[im 24/32  bone]
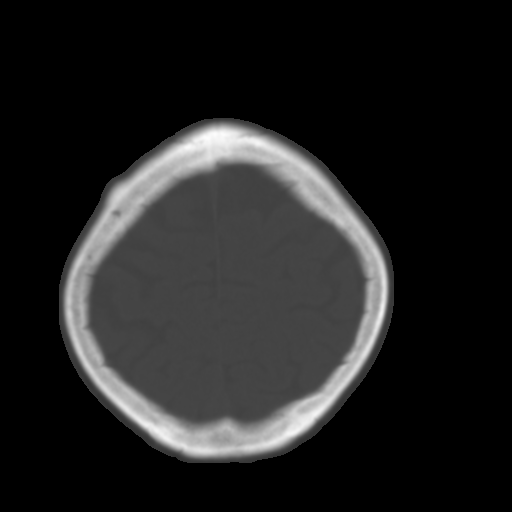
[im 26/32  brain]
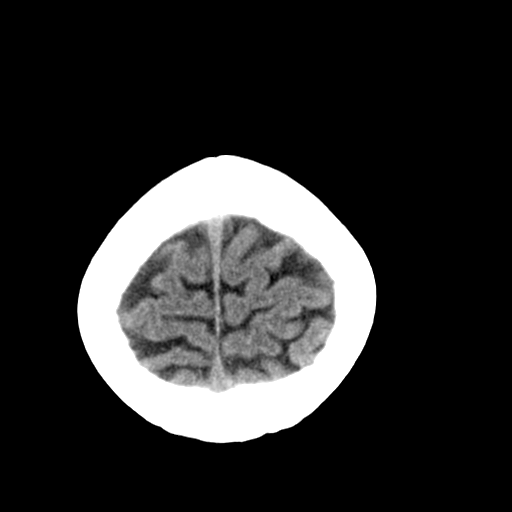
[im 28/32  brain]
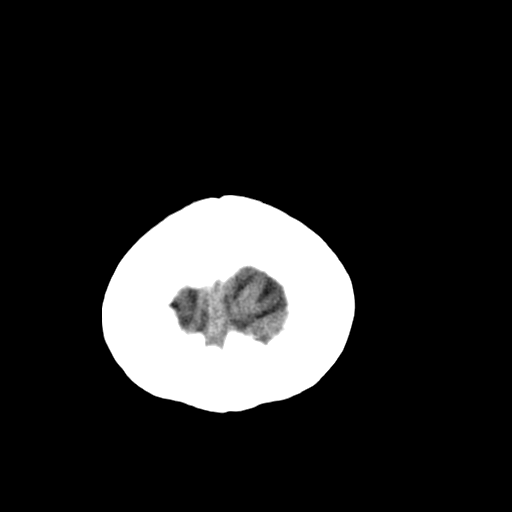
[im 30/32  brain]
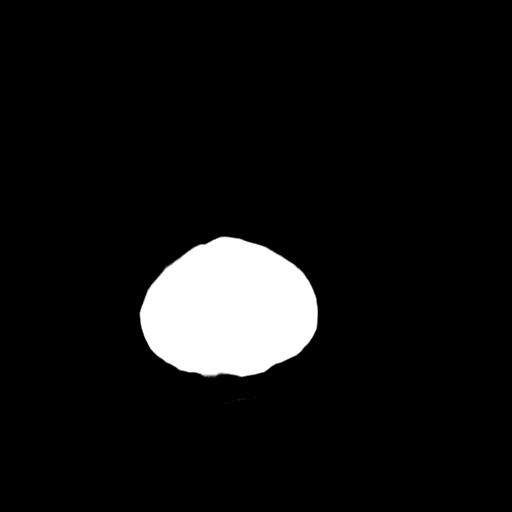

[16 of 30 positions shown; findings below may reference images not displayed]

FINDINGS: Visualized paranasal sinuses and mastoids are clear. No acute
osseous abnormality identified. Visualized orbits and scalp soft
tissues are within normal limits.

Calcified atherosclerosis at the skull base. Cerebral volume is
within normal limits for age. No ventriculomegaly. No midline shift,
mass effect, or evidence of intracranial mass lesion. No evidence of
cortically based acute infarction identified. No suspicious
intracranial vascular hyperdensity. Stable and normal gray-white
matter differentiation. No acute intracranial hemorrhage identified.
IMPRESSION: Stable and normal for age non contrast CT appearance of the brain.

## 2017-01-09 IMAGING — MR MR HEAD W/O CM
9 of 10 series · 40 of 48 positions shown · non-contrast
Comparison: Head CT 02/15/2015

CLINICAL DATA: Right upper and right lower extremity weakness and
numbness.

EXAM:
MRI HEAD WITHOUT CONTRAST
TECHNIQUE: Multiplanar, multiecho pulse sequences of the brain and surrounding
structures were obtained without intravenous contrast.

[Series 2: T1 · sagittal · 5.0mm · 0.45mm/px · 5 of 25 slices shown]
[im 1/25]
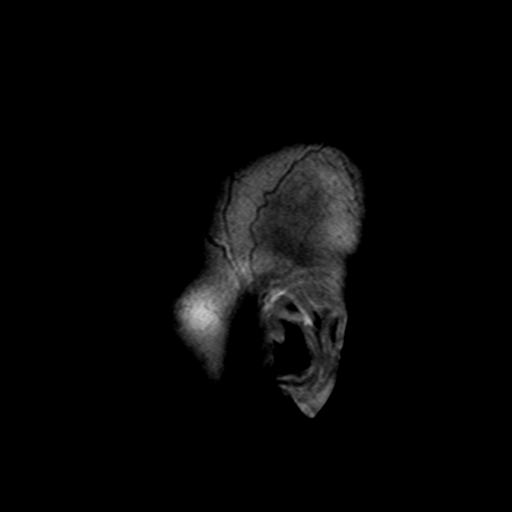
[im 7/25]
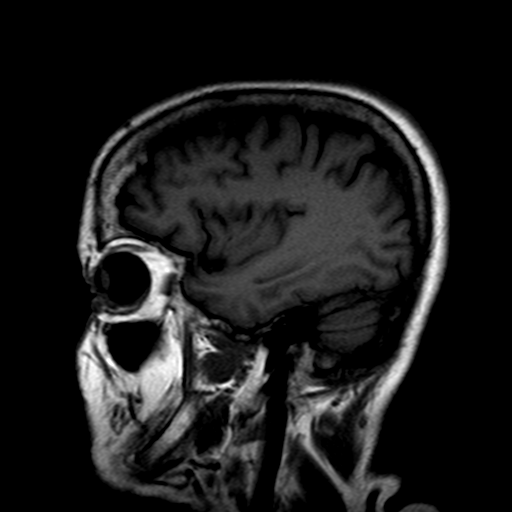
[im 13/25]
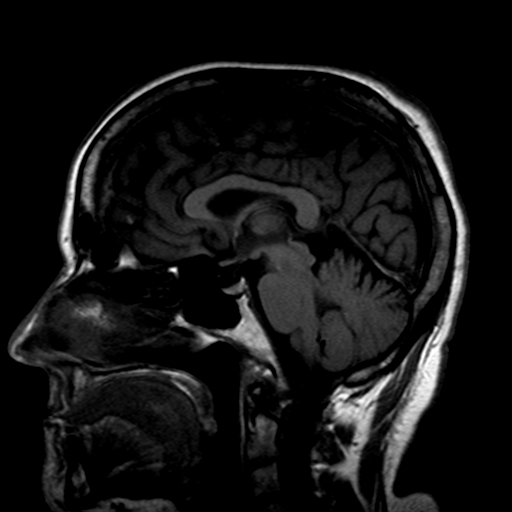
[im 19/25]
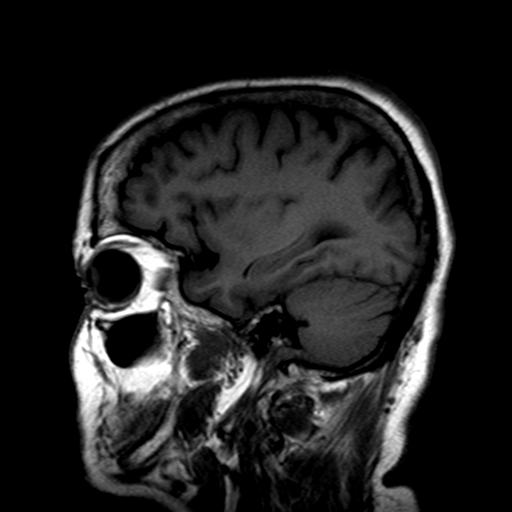
[im 25/25]
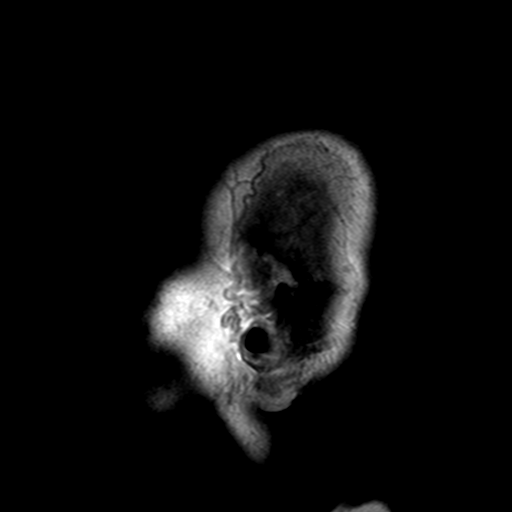

[Series 4: DWI · axial · 4.0mm · 0.94mm/px · z∈[-82,+74]mm · 6 of 40 slices shown (1 of 4)]
[im 1/40]
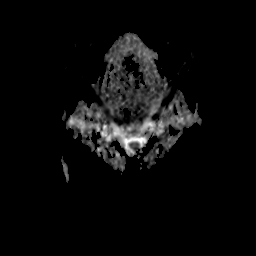
[im 8/40]
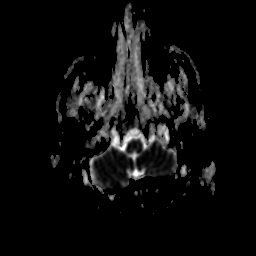
[im 16/40]
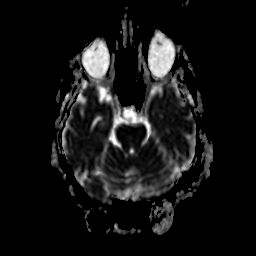
[im 24/40]
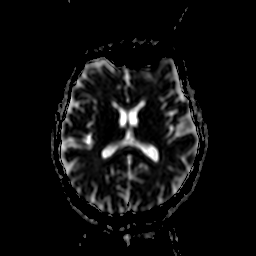
[im 32/40]
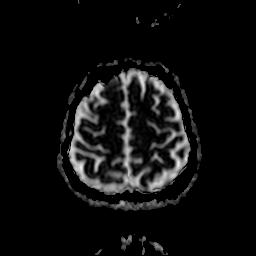
[im 40/40]
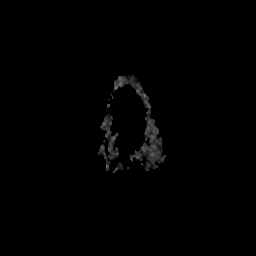

[Series 6: DWI · coronal · 5.0mm · 1.80mm/px · 5 of 35 slices shown (2 of 4)]
[im 1/35]
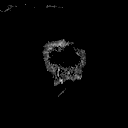
[im 9/35]
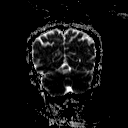
[im 18/35]
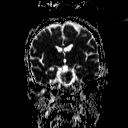
[im 26/35]
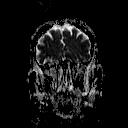
[im 35/35]
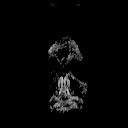

[Series 7: DWI · axial · 4.0mm · 0.94mm/px · z∈[-82,+74]mm · 6 of 40 slices shown (3 of 4)]
[im 1/40]
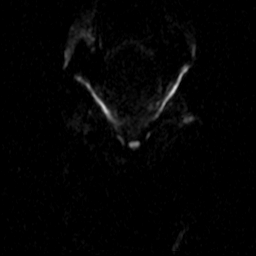
[im 8/40]
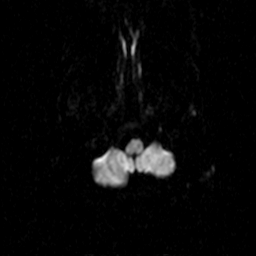
[im 16/40]
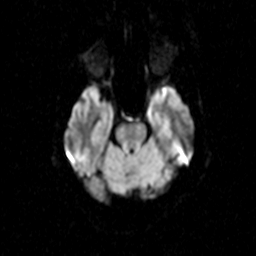
[im 24/40]
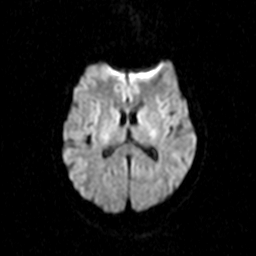
[im 32/40]
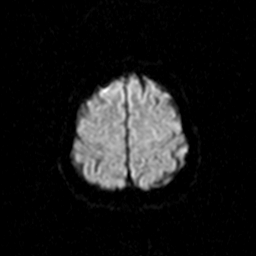
[im 40/40]
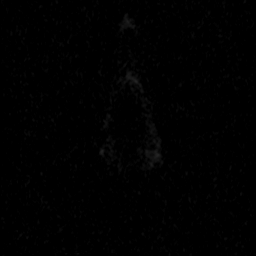

[Series 8: DWI · coronal · 5.0mm · 1.80mm/px · 5 of 34 slices shown (4 of 4)]
[im 1/34]
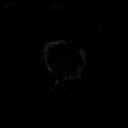
[im 9/34]
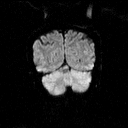
[im 17/34]
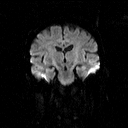
[im 25/34]
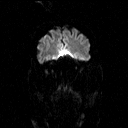
[im 34/34]
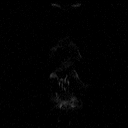

[Series 9: T2 · axial · 5.0mm · 0.45mm/px · z∈[-63,+79]mm · 3 of 23 slices shown (1 of 3)]
[im 1/23]
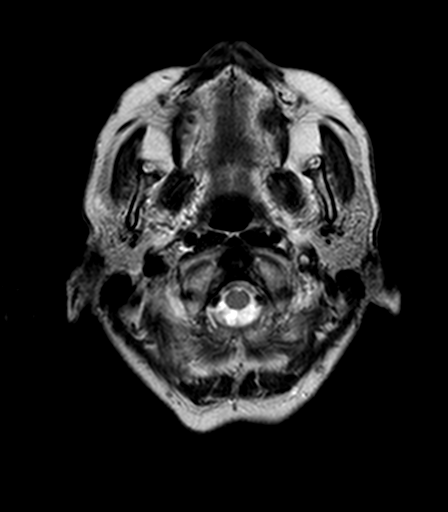
[im 12/23]
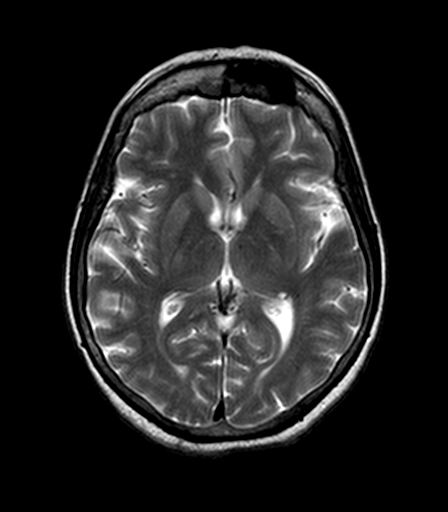
[im 23/23]
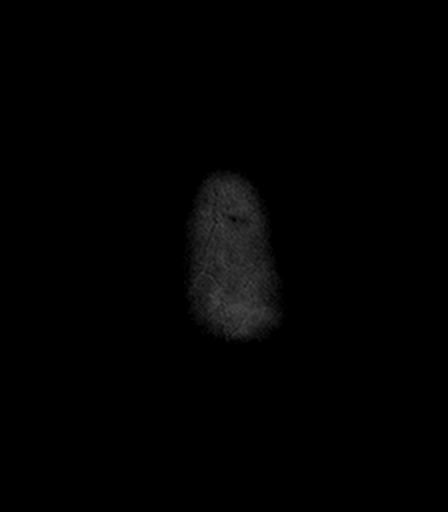

[Series 10: FLAIR · axial · 5.0mm · 0.90mm/px · z∈[-63,+79]mm · 3 of 23 slices shown]
[im 1/23]
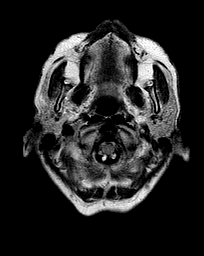
[im 12/23]
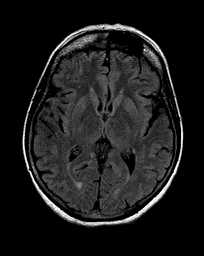
[im 23/23]
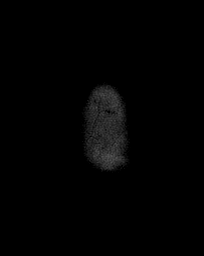

[Series 11: T2 · axial · 5.0mm · 0.45mm/px · z∈[-66,+77]mm · 3 of 23 slices shown (2 of 3)]
[im 1/23]
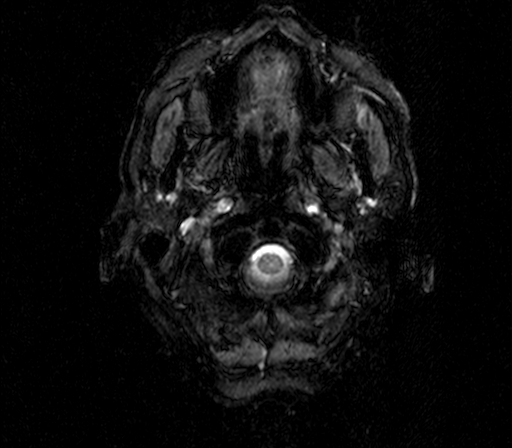
[im 12/23]
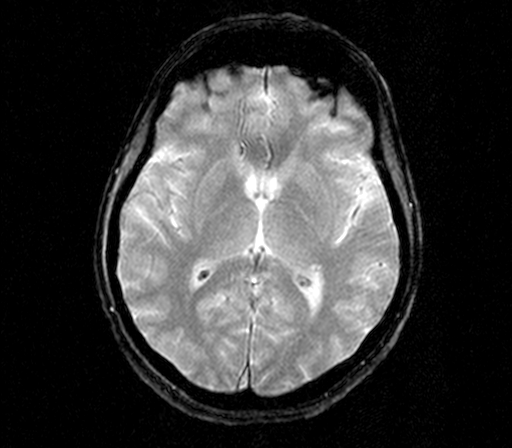
[im 23/23]
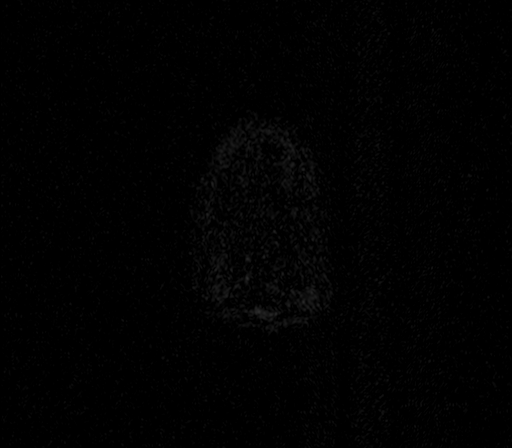

[Series 13: T2 · coronal · 5.0mm · 0.45mm/px · 4 of 27 slices shown (3 of 3)]
[im 1/27]
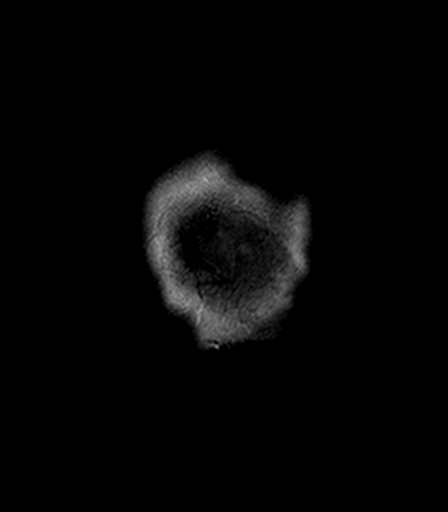
[im 9/27]
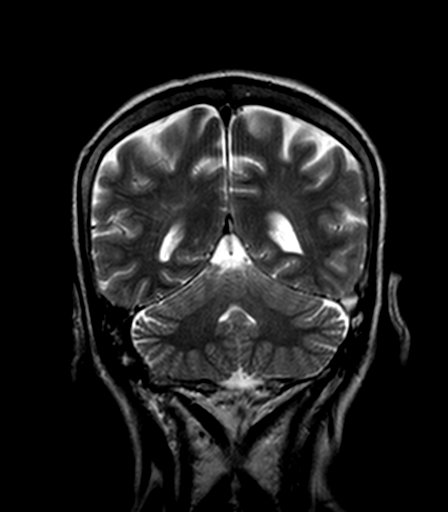
[im 18/27]
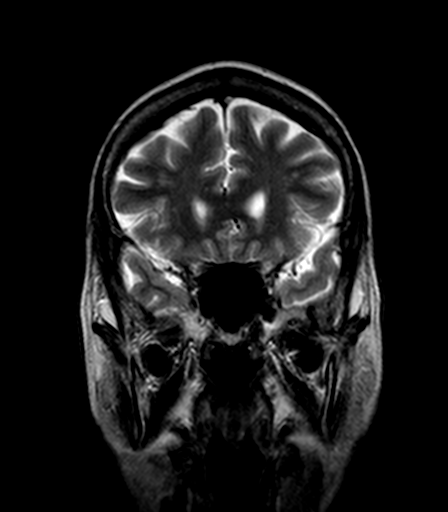
[im 27/27]
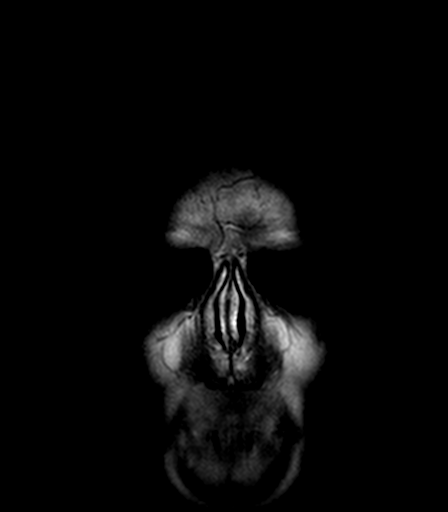

[40 of 48 positions shown; findings below may reference images not displayed]

FINDINGS: There is no evidence of acute infarct, intracranial hemorrhage,
mass, midline shift, or extra-axial fluid collection. Ventricles and
sulci are normal. Small foci of T2 hyperintensity are noted in the
bilateral cerebral white matter, predominantly in the frontal lobes
and subcortical in location, nonspecific but compatible with minimal
chronic small vessel ischemic disease.

Orbits are unremarkable. Paranasal sinuses are clear. There are
trace bilateral pleural effusions. Major intracranial vascular flow
voids are preserved.
IMPRESSION: 1. No acute intracranial abnormality.
2. Minimal chronic small vessel ischemic disease.

## 2017-01-21 DIAGNOSIS — M5412 Radiculopathy, cervical region: Secondary | ICD-10-CM | POA: Diagnosis not present

## 2017-01-21 DIAGNOSIS — G894 Chronic pain syndrome: Secondary | ICD-10-CM | POA: Diagnosis not present

## 2017-01-21 DIAGNOSIS — M545 Low back pain: Secondary | ICD-10-CM | POA: Diagnosis not present

## 2017-01-21 DIAGNOSIS — M5416 Radiculopathy, lumbar region: Secondary | ICD-10-CM | POA: Diagnosis not present

## 2017-01-21 DIAGNOSIS — I10 Essential (primary) hypertension: Secondary | ICD-10-CM | POA: Diagnosis not present

## 2017-01-21 DIAGNOSIS — Z79899 Other long term (current) drug therapy: Secondary | ICD-10-CM | POA: Diagnosis not present

## 2017-01-21 DIAGNOSIS — F1120 Opioid dependence, uncomplicated: Secondary | ICD-10-CM | POA: Diagnosis not present

## 2017-01-21 DIAGNOSIS — G8929 Other chronic pain: Secondary | ICD-10-CM | POA: Diagnosis not present

## 2017-01-21 DIAGNOSIS — M542 Cervicalgia: Secondary | ICD-10-CM | POA: Diagnosis not present

## 2017-02-01 IMAGING — CR DG CHEST 1V
1 series · 1 of 1 positions shown · non-contrast
Comparison: Radiographs 01/10/2015.  Abdominal CT 10/08/2014.

CLINICAL DATA: Headache with syncope for 4 or 5 days. Slight chest
pain and shortness of breath. Initial encounter.

EXAM:
CHEST  1 VIEW

[ap]
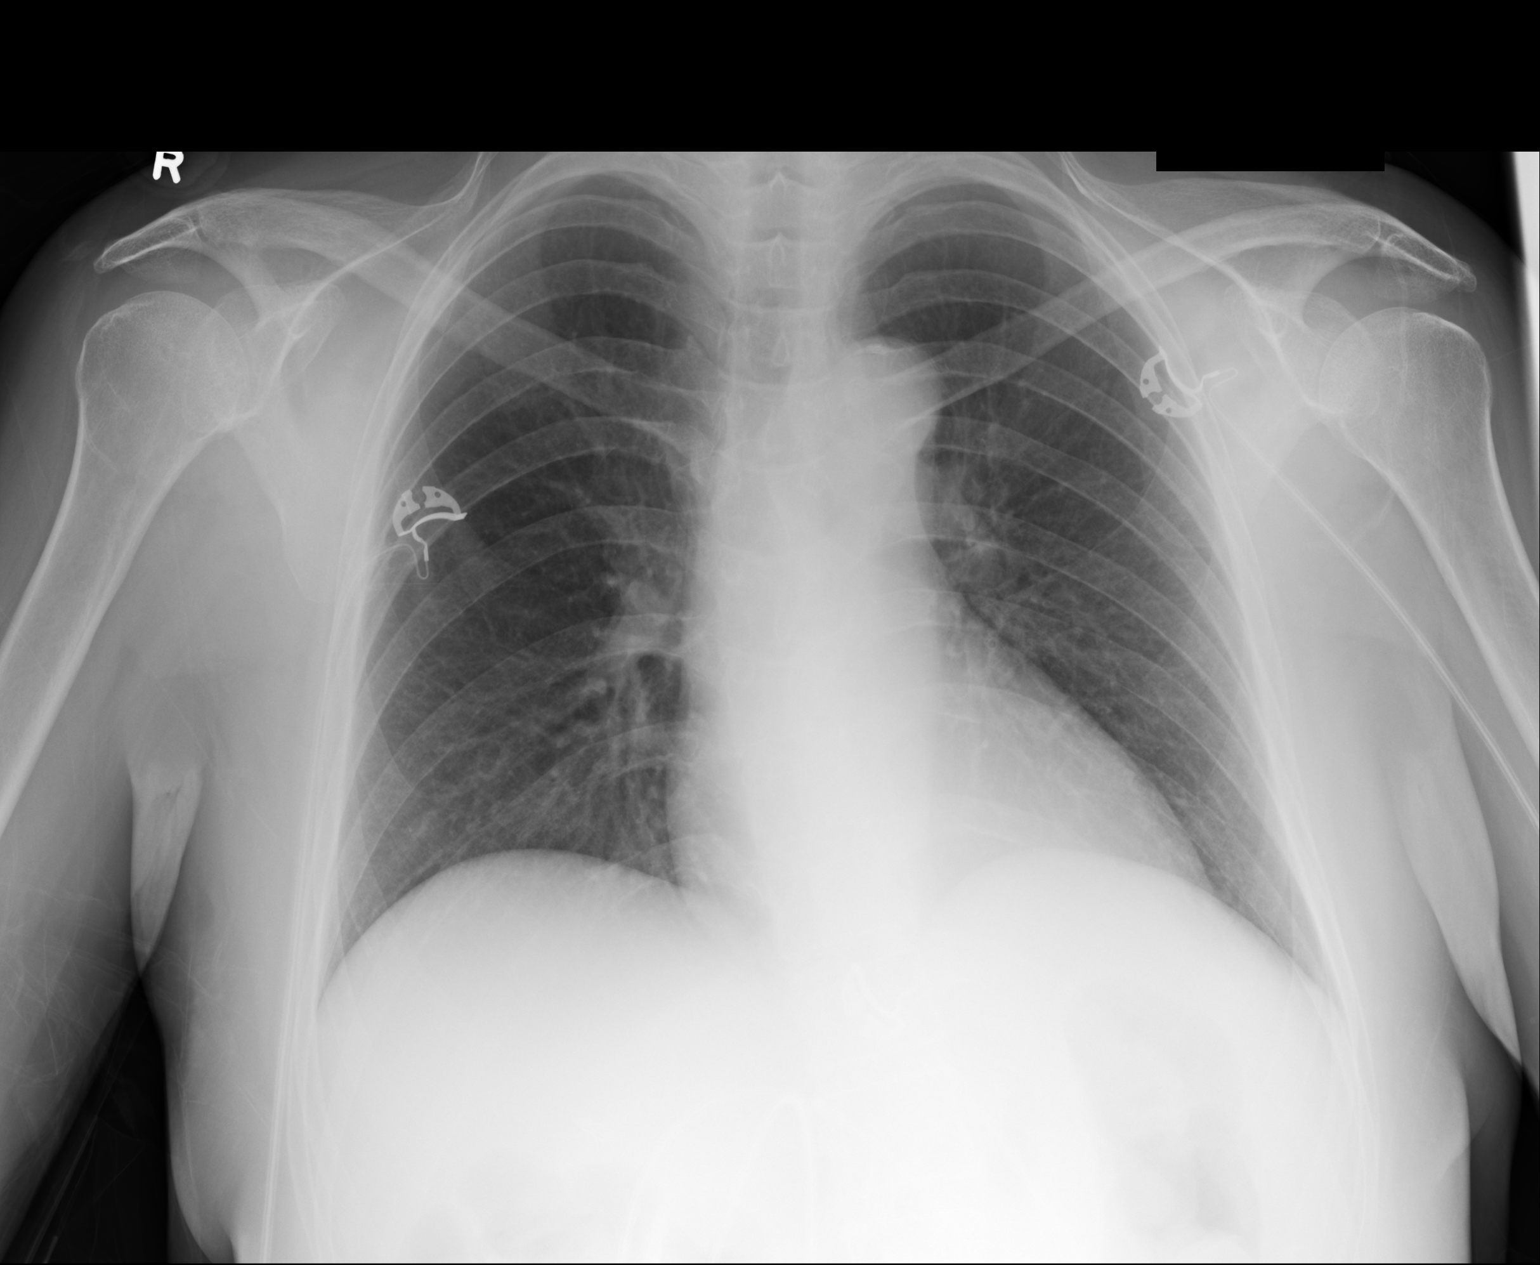

[1 of 1 positions shown; findings below may reference images not displayed]

FINDINGS: 6506 hr. The heart size and mediastinal contours are normal. The
lungs are clear. There is no pleural effusion or pneumothorax. No
acute osseous findings are identified.
IMPRESSION: No active cardiopulmonary process.

## 2017-02-01 IMAGING — CT CT HEAD W/O CM
1 series · 16 of 29 positions shown, 20 images · non-contrast
Comparison: Head CT February 15, 2015 and brain MRI February 16, 2015

CLINICAL DATA: Diffuse headache for 3 days, more severe on the
right than on left. History of prior cervical carcinoma.

EXAM:
CT HEAD WITHOUT CONTRAST
TECHNIQUE: Contiguous axial images were obtained from the base of the skull
through the vertex without intravenous contrast.

[Series 2: soft tissue · axial · 0.42mm/px · z∈[+510,+640]mm · 16 of 29 slices shown, 20 images]
[im 2/29  brain]
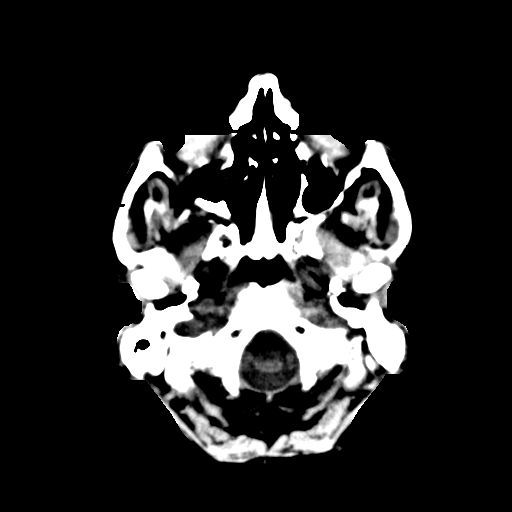
[im 2/29  bone]
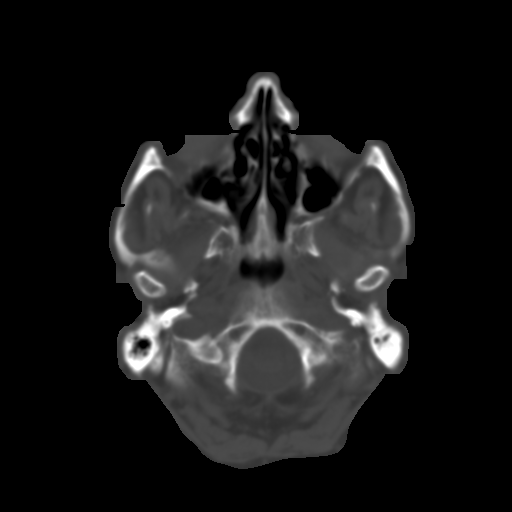
[im 4/29  brain]
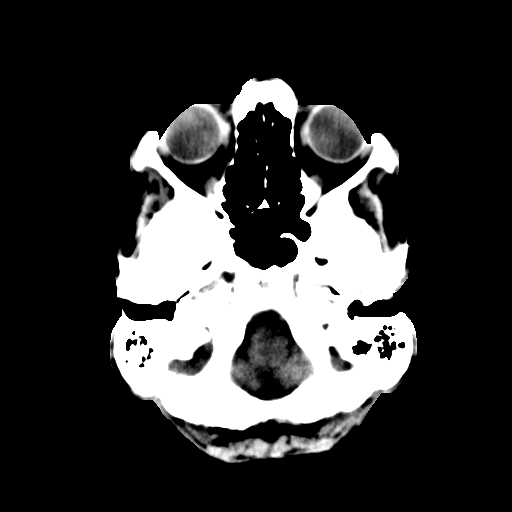
[im 6/29  brain]
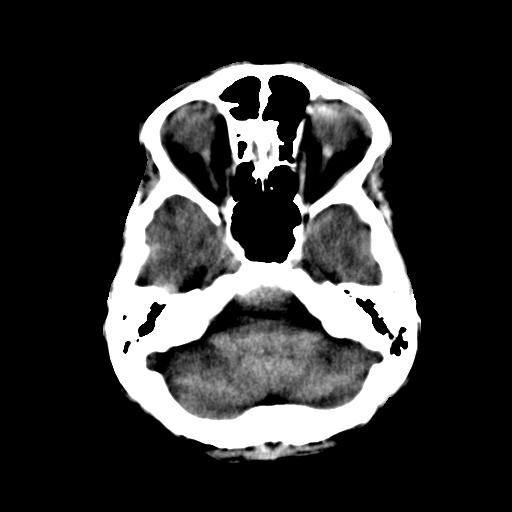
[im 7/29  brain]
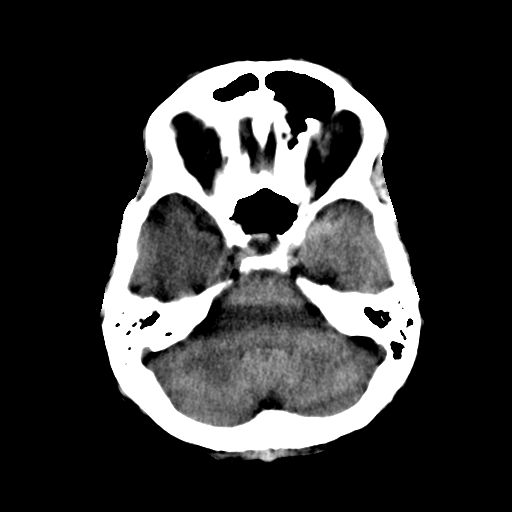
[im 9/29  brain]
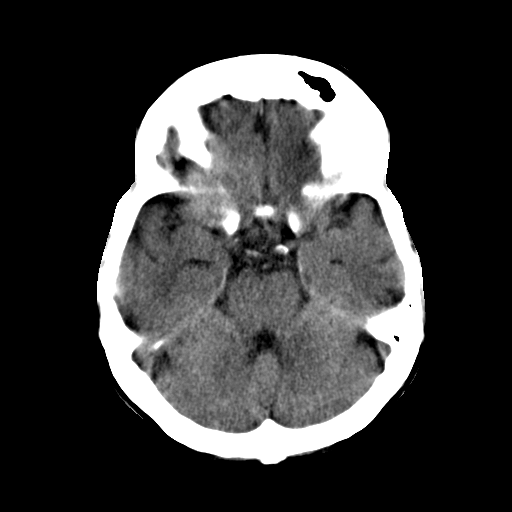
[im 9/29  bone]
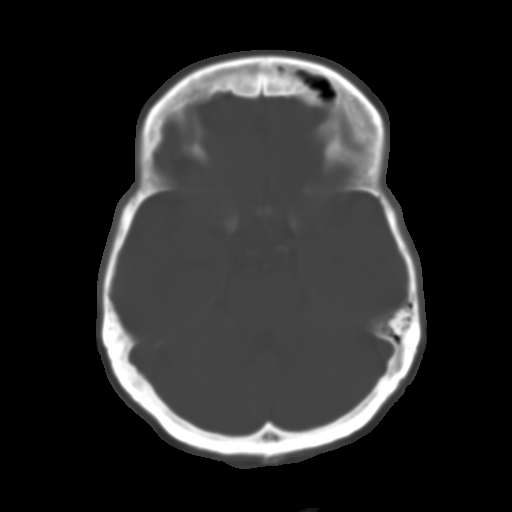
[im 11/29  brain]
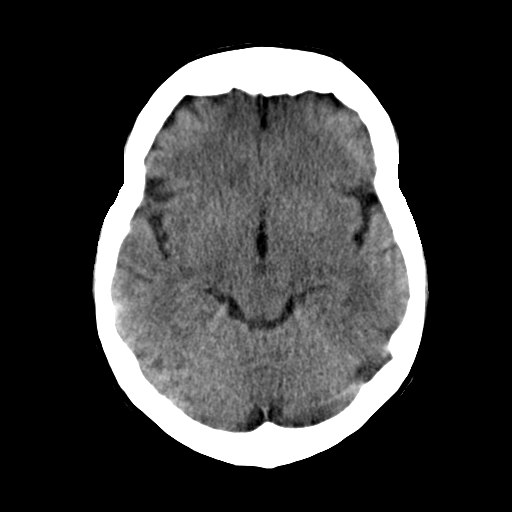
[im 12/29  brain]
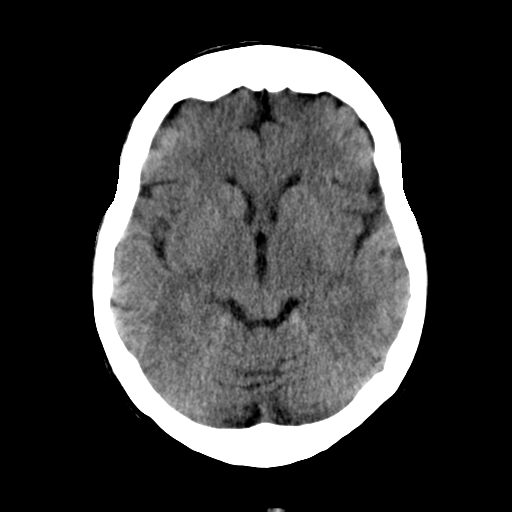
[im 14/29  brain]
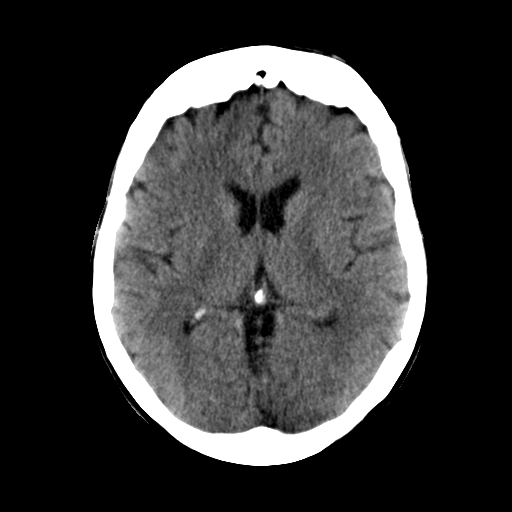
[im 16/29  brain]
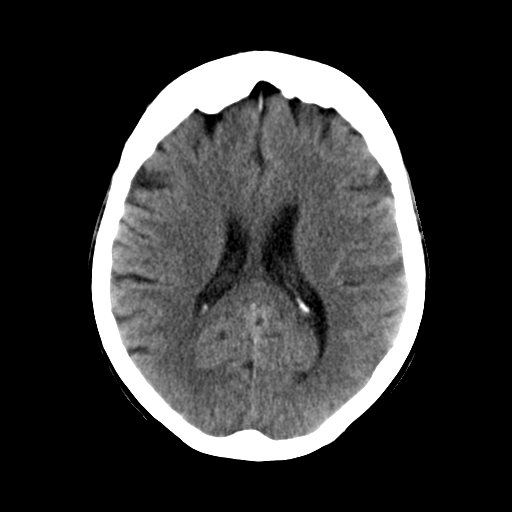
[im 16/29  bone]
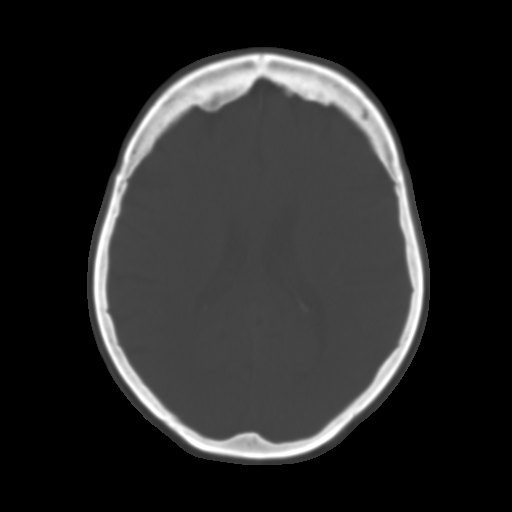
[im 18/29  brain]
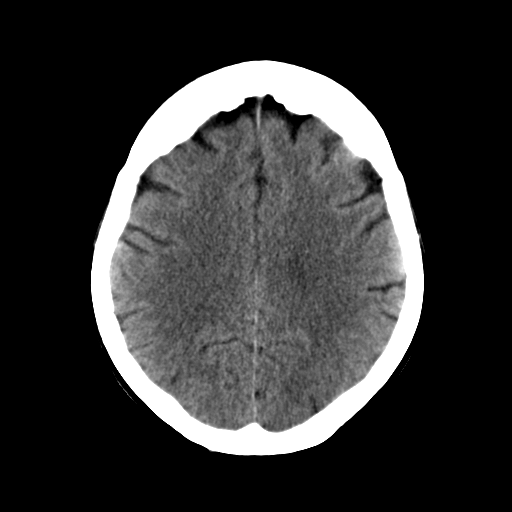
[im 19/29  brain]
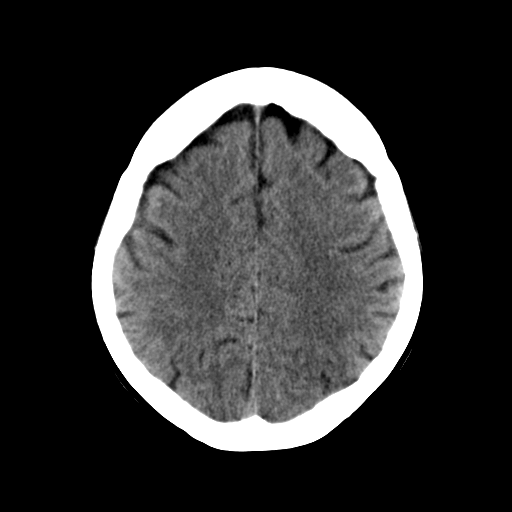
[im 21/29  brain]
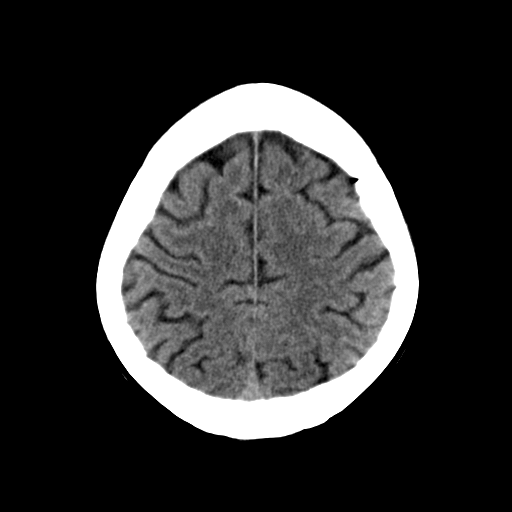
[im 23/29  brain]
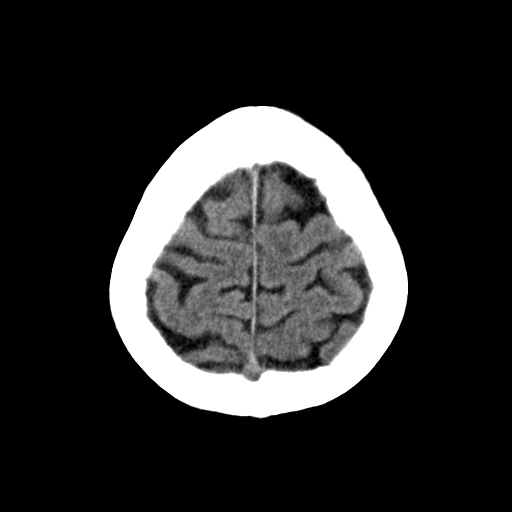
[im 23/29  bone]
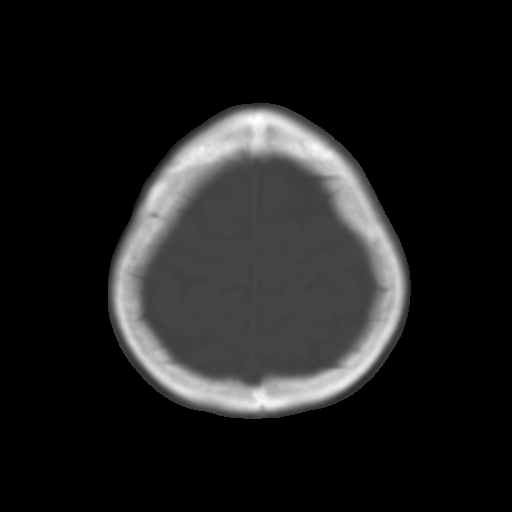
[im 24/29  brain]
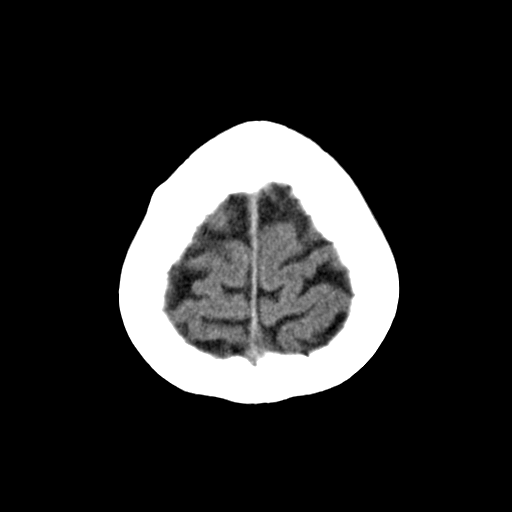
[im 26/29  brain]
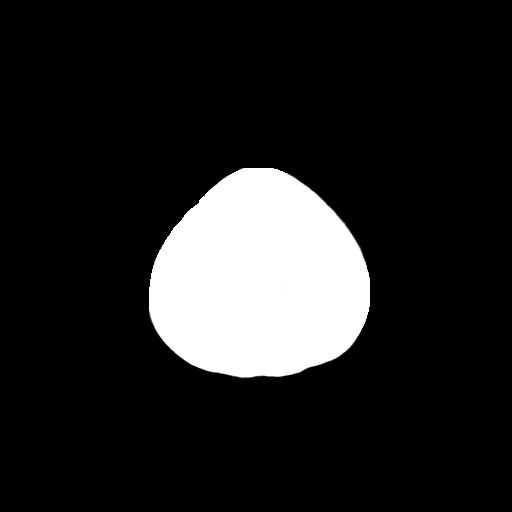
[im 28/29  brain]
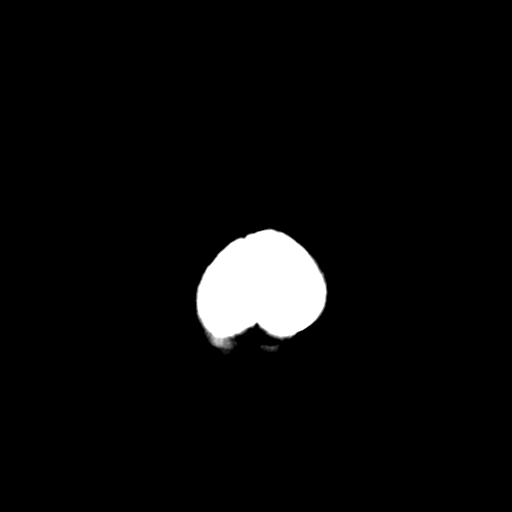

[16 of 29 positions shown; findings below may reference images not displayed]

FINDINGS: The ventricles and sulci appear within normal limits with respect to
size and configuration for age. There is no intracranial mass,
hemorrhage, extra-axial fluid collection, or midline shift.
Gray-white compartments appear within normal limits. No acute
infarct evident. The bony calvarium appears intact. The mastoid air
cells are clear.
IMPRESSION: No intracranial mass, hemorrhage, or focal gray - white compartment
lesions/acute appearing infarct. No appreciable change compared to
recent prior study.

## 2017-02-25 DIAGNOSIS — M542 Cervicalgia: Secondary | ICD-10-CM | POA: Diagnosis not present

## 2017-02-25 DIAGNOSIS — M5412 Radiculopathy, cervical region: Secondary | ICD-10-CM | POA: Diagnosis not present

## 2017-02-25 DIAGNOSIS — M545 Low back pain: Secondary | ICD-10-CM | POA: Diagnosis not present

## 2017-02-25 DIAGNOSIS — M5416 Radiculopathy, lumbar region: Secondary | ICD-10-CM | POA: Diagnosis not present

## 2017-02-25 DIAGNOSIS — G894 Chronic pain syndrome: Secondary | ICD-10-CM | POA: Diagnosis not present

## 2017-02-25 DIAGNOSIS — I10 Essential (primary) hypertension: Secondary | ICD-10-CM | POA: Diagnosis not present

## 2017-02-25 DIAGNOSIS — F1120 Opioid dependence, uncomplicated: Secondary | ICD-10-CM | POA: Diagnosis not present

## 2017-02-25 DIAGNOSIS — Z79899 Other long term (current) drug therapy: Secondary | ICD-10-CM | POA: Diagnosis not present

## 2017-02-25 DIAGNOSIS — G8929 Other chronic pain: Secondary | ICD-10-CM | POA: Diagnosis not present

## 2017-02-26 IMAGING — CR DG CHEST 1V PORT
1 series · 1 of 1 positions shown · non-contrast
Comparison: None.

CLINICAL DATA: Nausea.  Chest pain

EXAM:
PORTABLE CHEST - 1 VIEW

[ap]
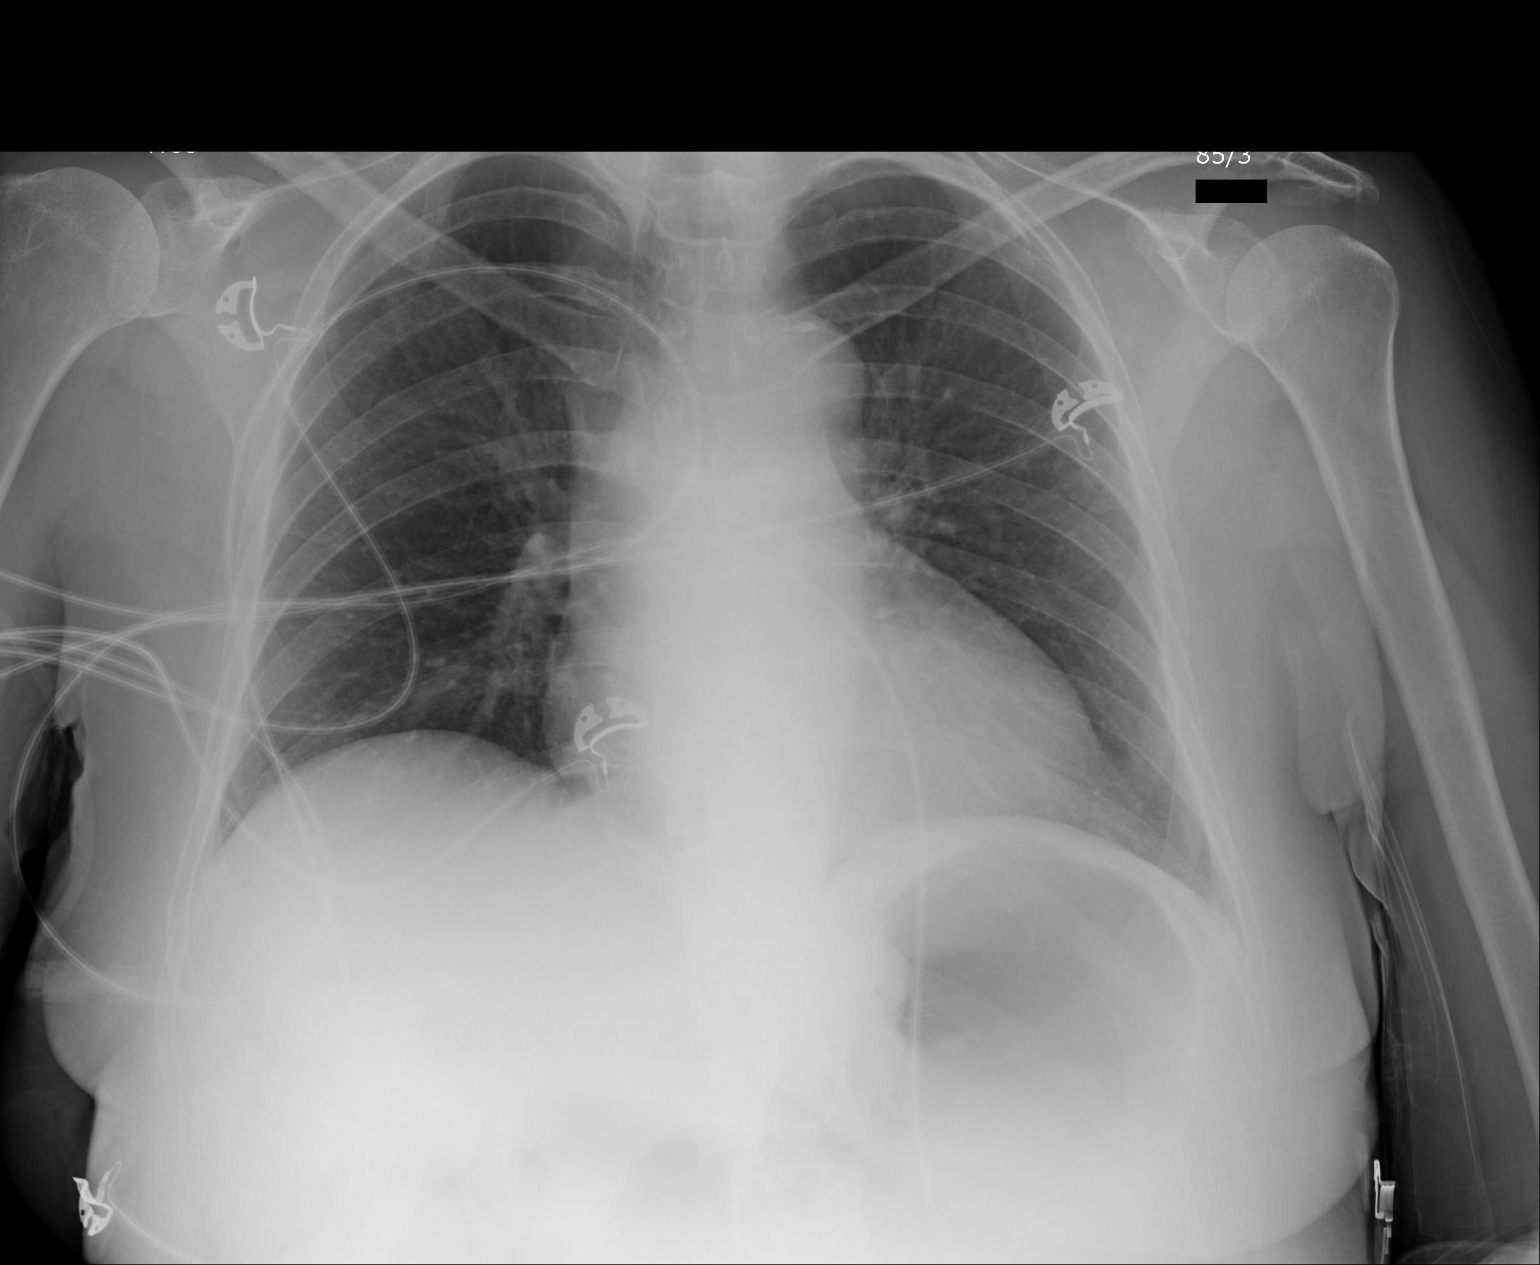

[1 of 1 positions shown; findings below may reference images not displayed]

FINDINGS: The heart size and mediastinal contours are within normal limits.
Both lungs are clear. The visualized skeletal structures are
unremarkable.
IMPRESSION: No active disease.

## 2017-02-26 IMAGING — CR DG ABDOMEN 1V
1 series · 1 of 1 positions shown · non-contrast
Comparison: 04/03/2015

CLINICAL DATA: Epigastric abdominal pain

EXAM:
ABDOMEN - 1 VIEW

[ap]
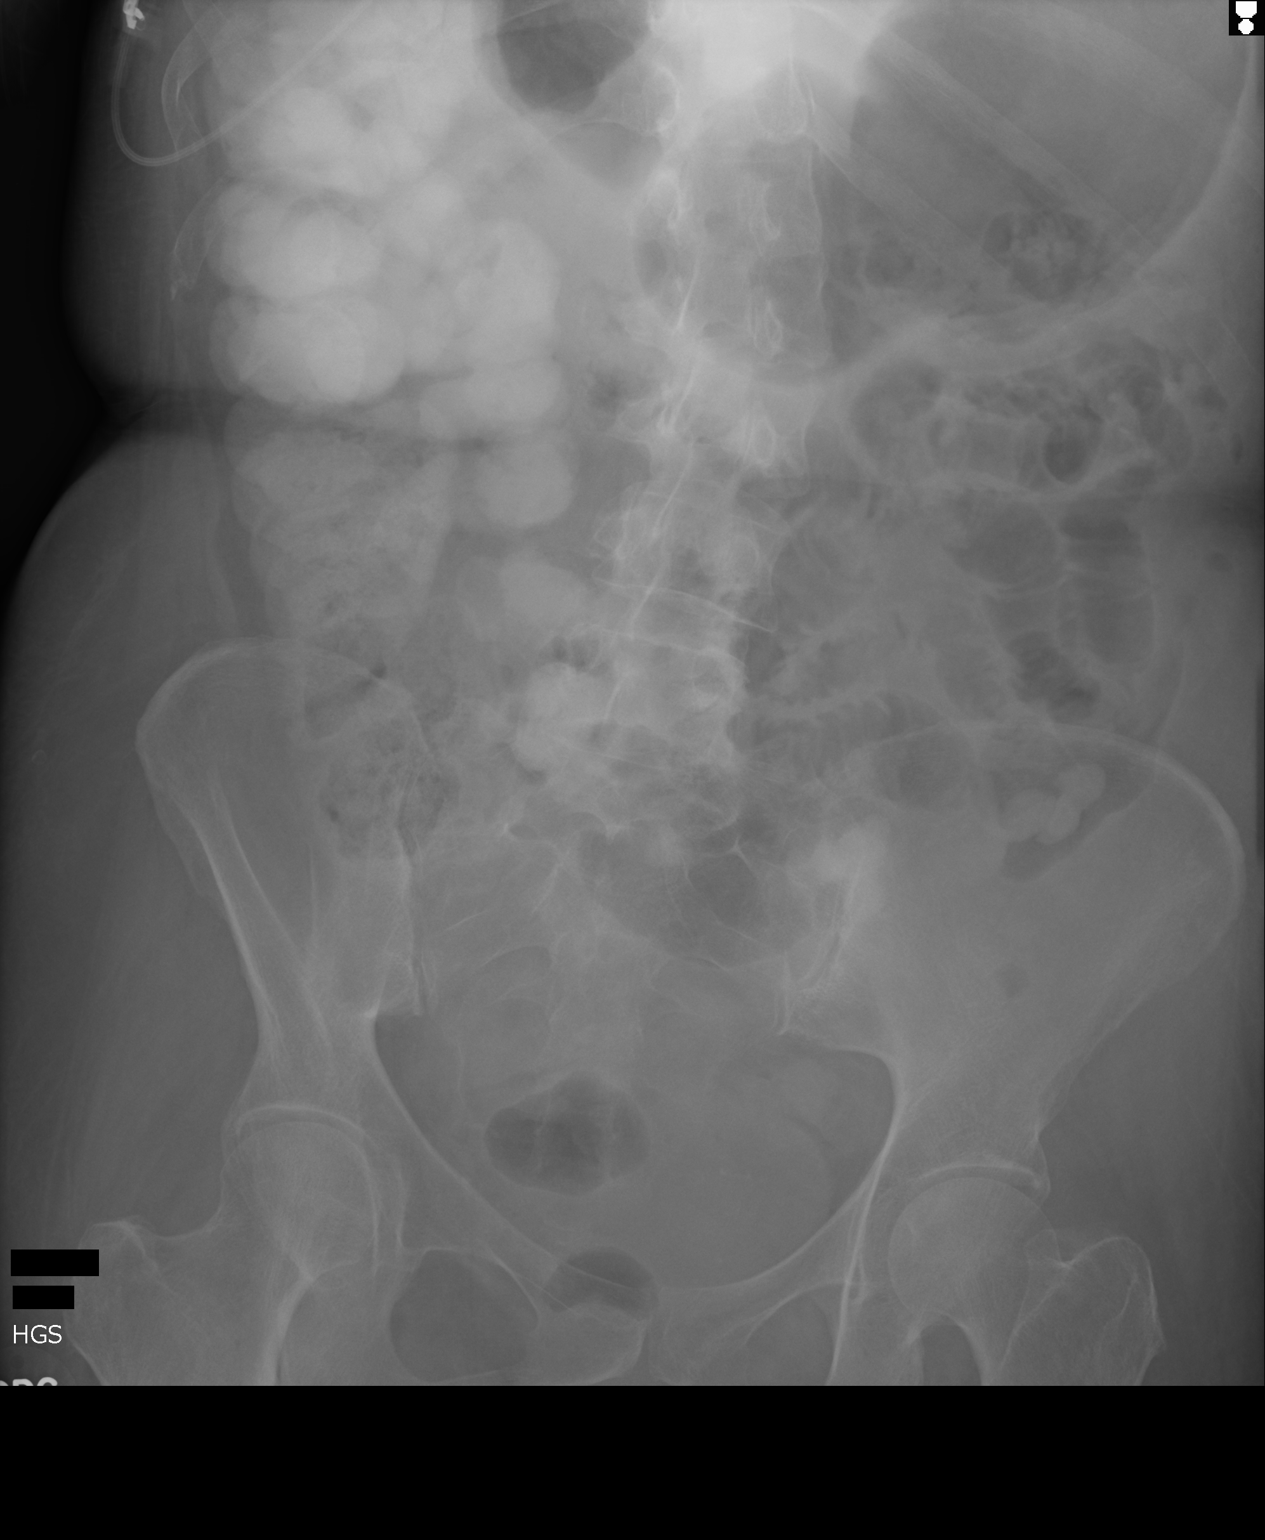

[1 of 1 positions shown; findings below may reference images not displayed]

FINDINGS: Scattered large and small bowel gas is noted. Fecal material is
noted within the colon. Additionally contrast from recent CT
examination is seen within the colon. No obstructive changes are
noted. No free air is seen. No acute bony abnormality is noted.
IMPRESSION: No acute abnormality noted.

## 2017-03-06 IMAGING — CT CT HEAD W/O CM
1 series · 16 of 30 positions shown, 20 images · non-contrast
Comparison: 03/11/2015

CLINICAL DATA: Unwitnessed fall to the floor.

EXAM:
CT HEAD WITHOUT CONTRAST
TECHNIQUE: Contiguous axial images were obtained from the base of the skull
through the vertex without intravenous contrast.

[Series 2: head wo · axial · 0.40mm/px · z∈[+1181,+1311]mm · 16 of 32 slices shown, 20 images]
[im 2/32  brain]
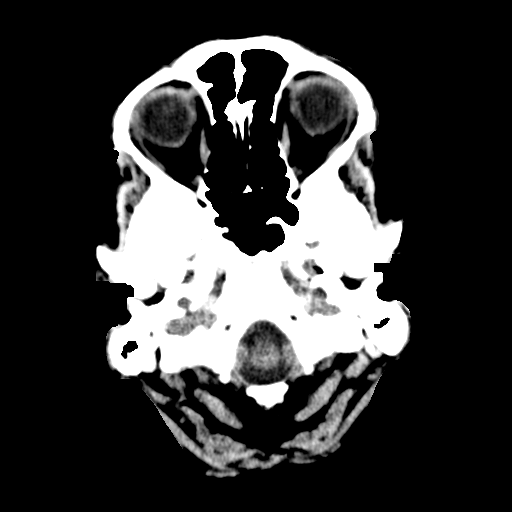
[im 2/32  bone]
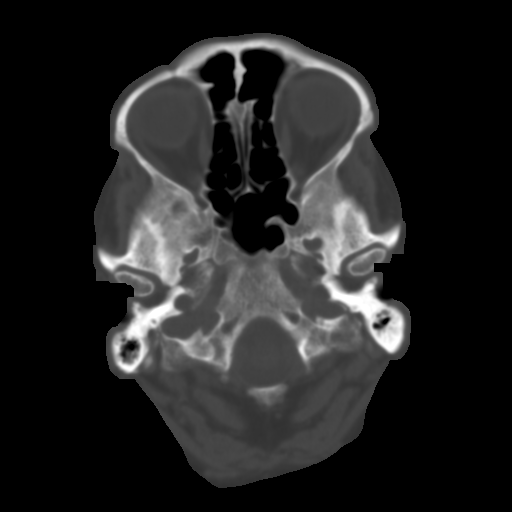
[im 4/32  brain]
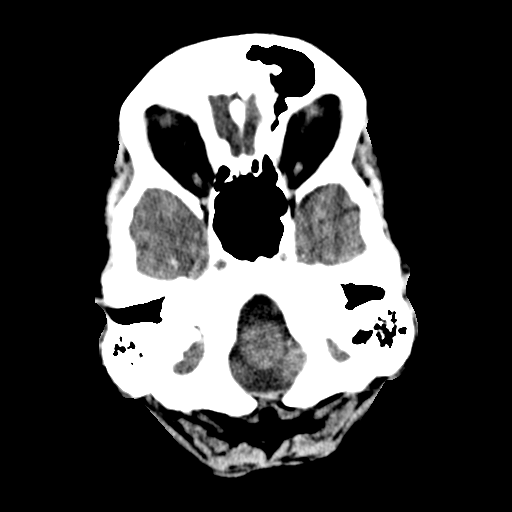
[im 6/32  brain]
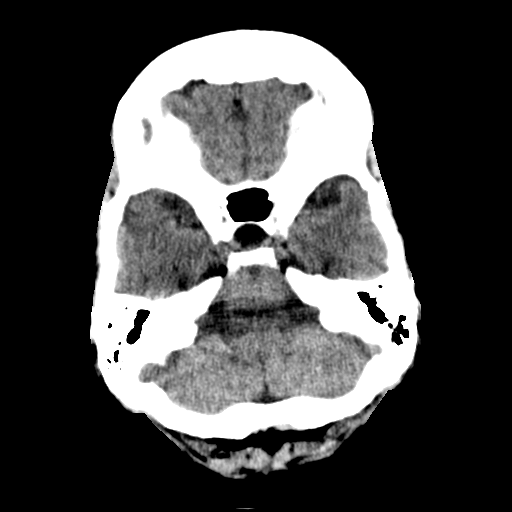
[im 8/32  brain]
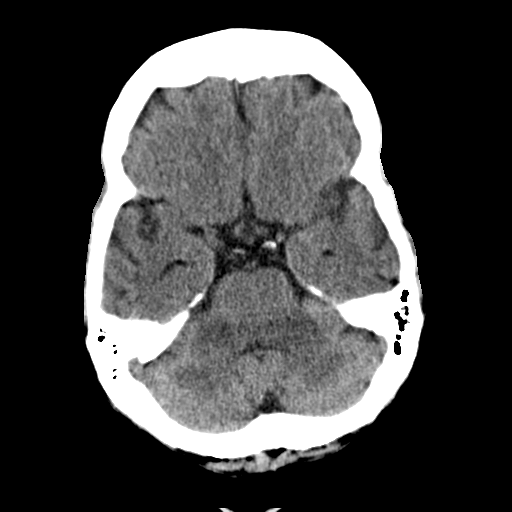
[im 9/32  brain]
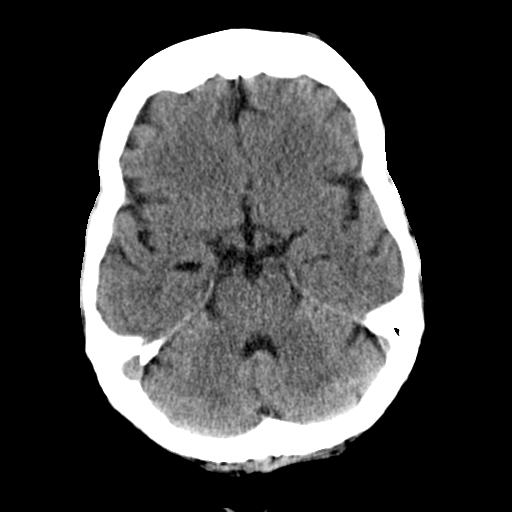
[im 9/32  bone]
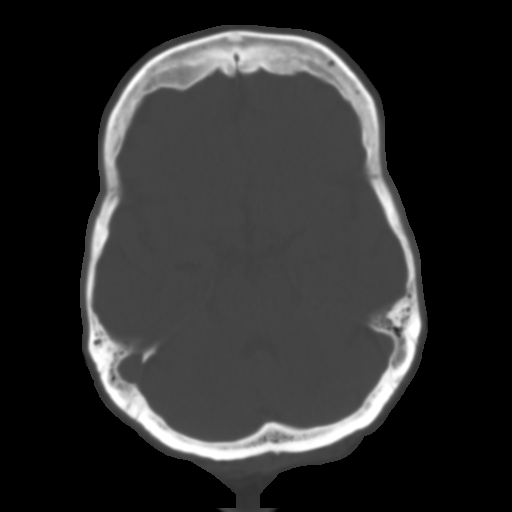
[im 11/32  brain]
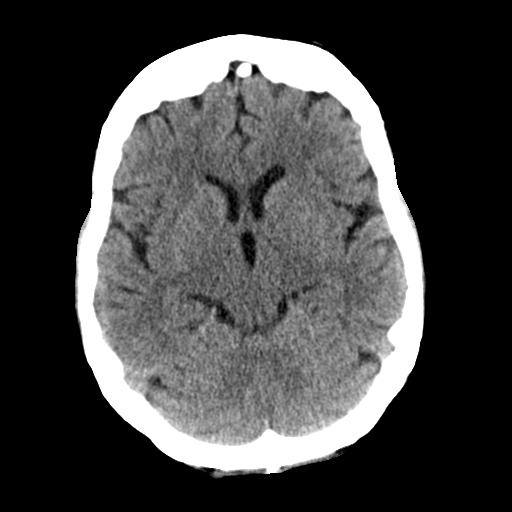
[im 13/32  brain]
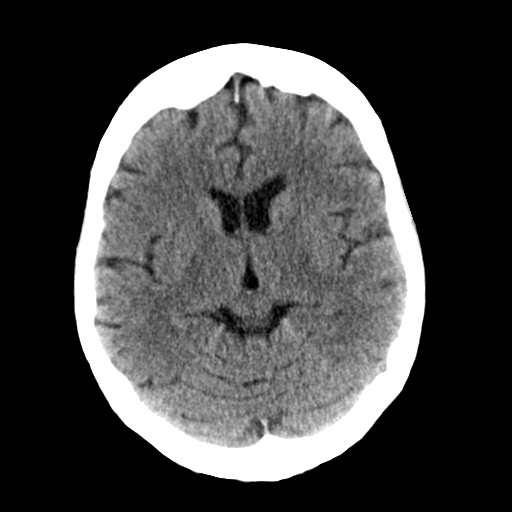
[im 15/32  brain]
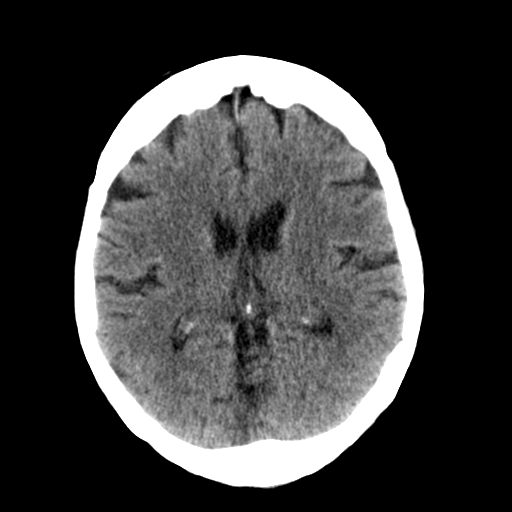
[im 17/32  brain]
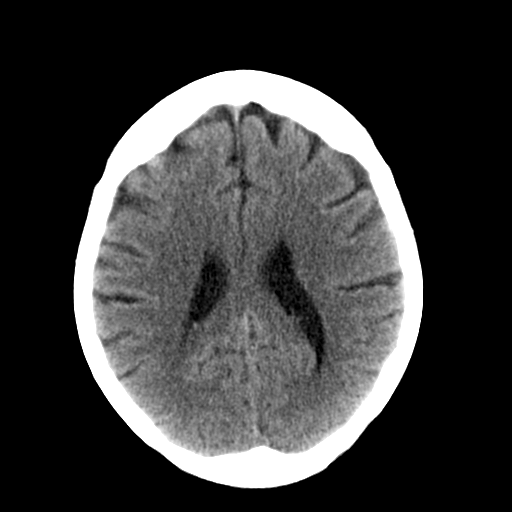
[im 17/32  bone]
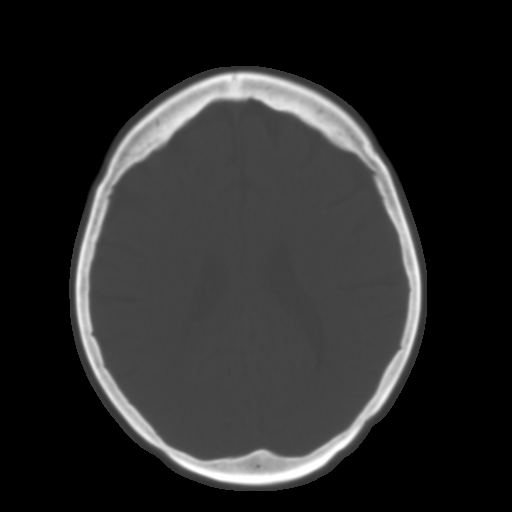
[im 19/32  brain]
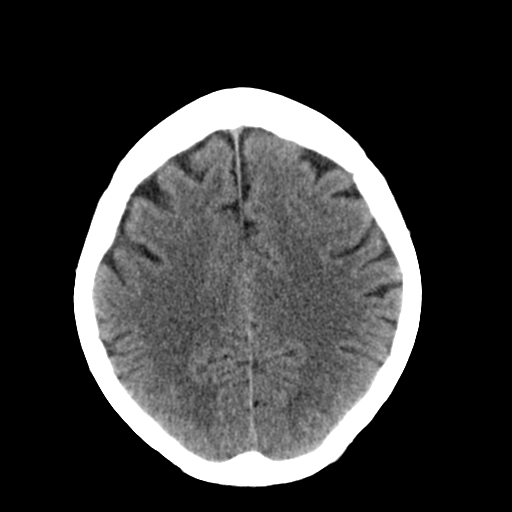
[im 21/32  brain]
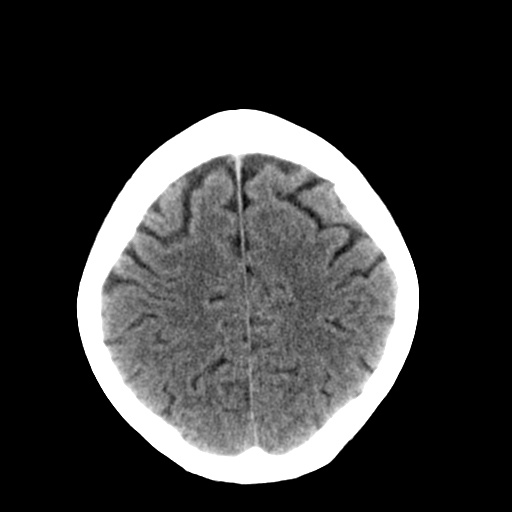
[im 23/32  brain]
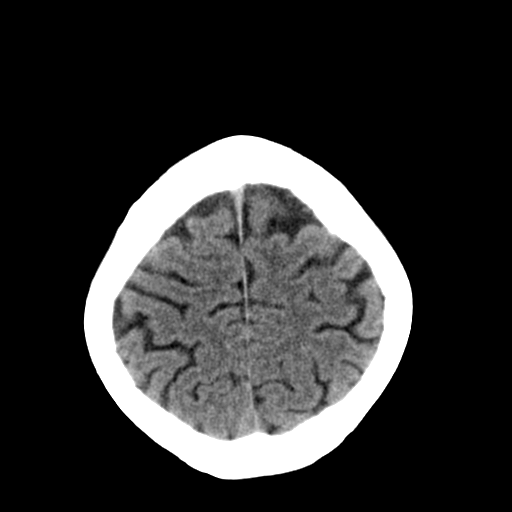
[im 24/32  brain]
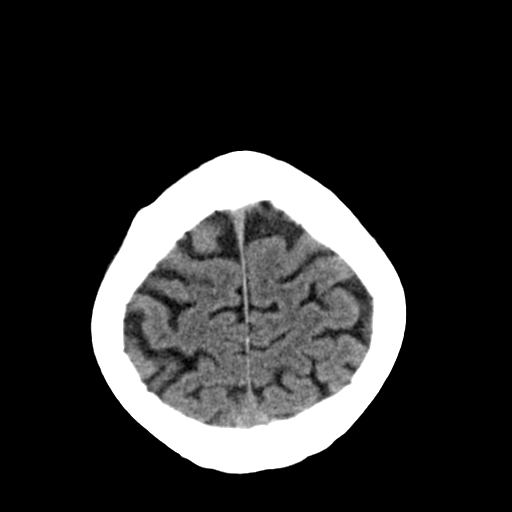
[im 24/32  bone]
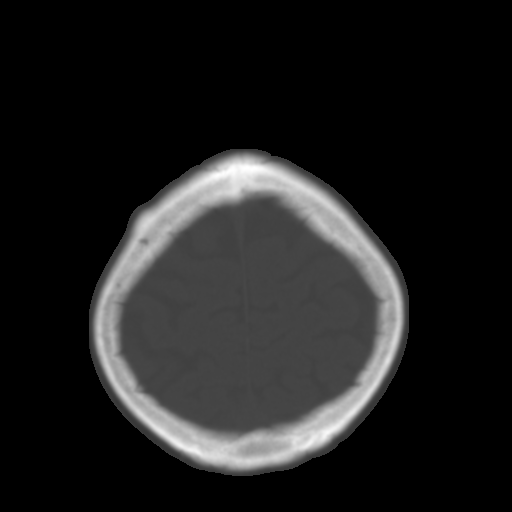
[im 26/32  brain]
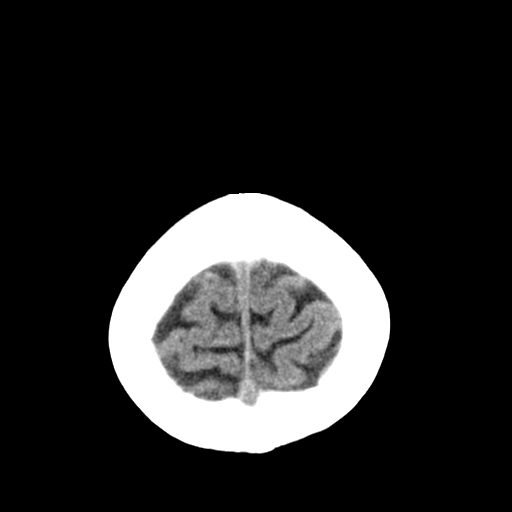
[im 28/32  brain]
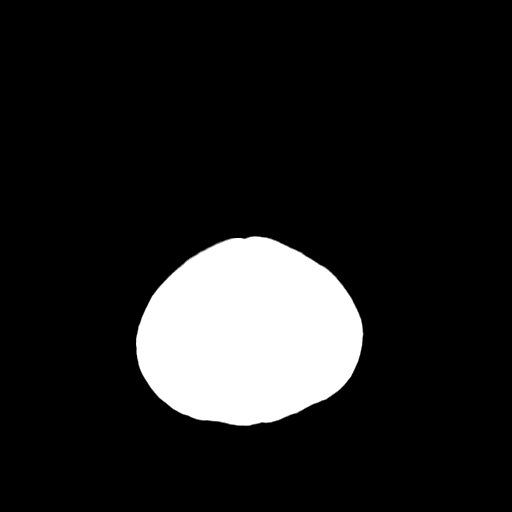
[im 30/32  brain]
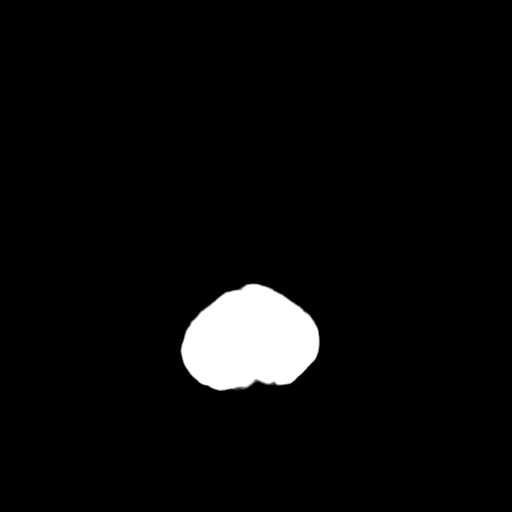

[16 of 30 positions shown; findings below may reference images not displayed]

FINDINGS: There is no intracranial hemorrhage, mass or evidence of acute
infarction. There is mild generalized atrophy. There is minimal
chronic microvascular ischemic change. There is no significant
extra-axial fluid collection.

No acute intracranial findings are evident. There is no bone
abnormality. The visible paranasal sinuses are clear.
IMPRESSION: Mild chronic atrophy and minimal microvascular changes. No acute
findings.

## 2017-03-25 DIAGNOSIS — M5412 Radiculopathy, cervical region: Secondary | ICD-10-CM | POA: Diagnosis not present

## 2017-03-25 DIAGNOSIS — M5416 Radiculopathy, lumbar region: Secondary | ICD-10-CM | POA: Diagnosis not present

## 2017-03-25 DIAGNOSIS — I10 Essential (primary) hypertension: Secondary | ICD-10-CM | POA: Diagnosis not present

## 2017-03-25 DIAGNOSIS — G894 Chronic pain syndrome: Secondary | ICD-10-CM | POA: Diagnosis not present

## 2017-03-25 DIAGNOSIS — M542 Cervicalgia: Secondary | ICD-10-CM | POA: Diagnosis not present

## 2017-03-25 DIAGNOSIS — M545 Low back pain: Secondary | ICD-10-CM | POA: Diagnosis not present

## 2017-03-25 DIAGNOSIS — Z79899 Other long term (current) drug therapy: Secondary | ICD-10-CM | POA: Diagnosis not present

## 2017-03-25 DIAGNOSIS — G8929 Other chronic pain: Secondary | ICD-10-CM | POA: Diagnosis not present

## 2017-03-25 DIAGNOSIS — F1120 Opioid dependence, uncomplicated: Secondary | ICD-10-CM | POA: Diagnosis not present

## 2017-04-09 IMAGING — CR DG CHEST 2V
1 series · 2 of 2 positions shown · non-contrast
Comparison: 04/05/2015

CLINICAL DATA: Epigastric chest pain nausea and vomiting for 3 days

EXAM:
CHEST  2 VIEW

[Series 1: dg chest 2 view · 0.14mm/px · 2 of 2 slices shown]
[im 1/2]
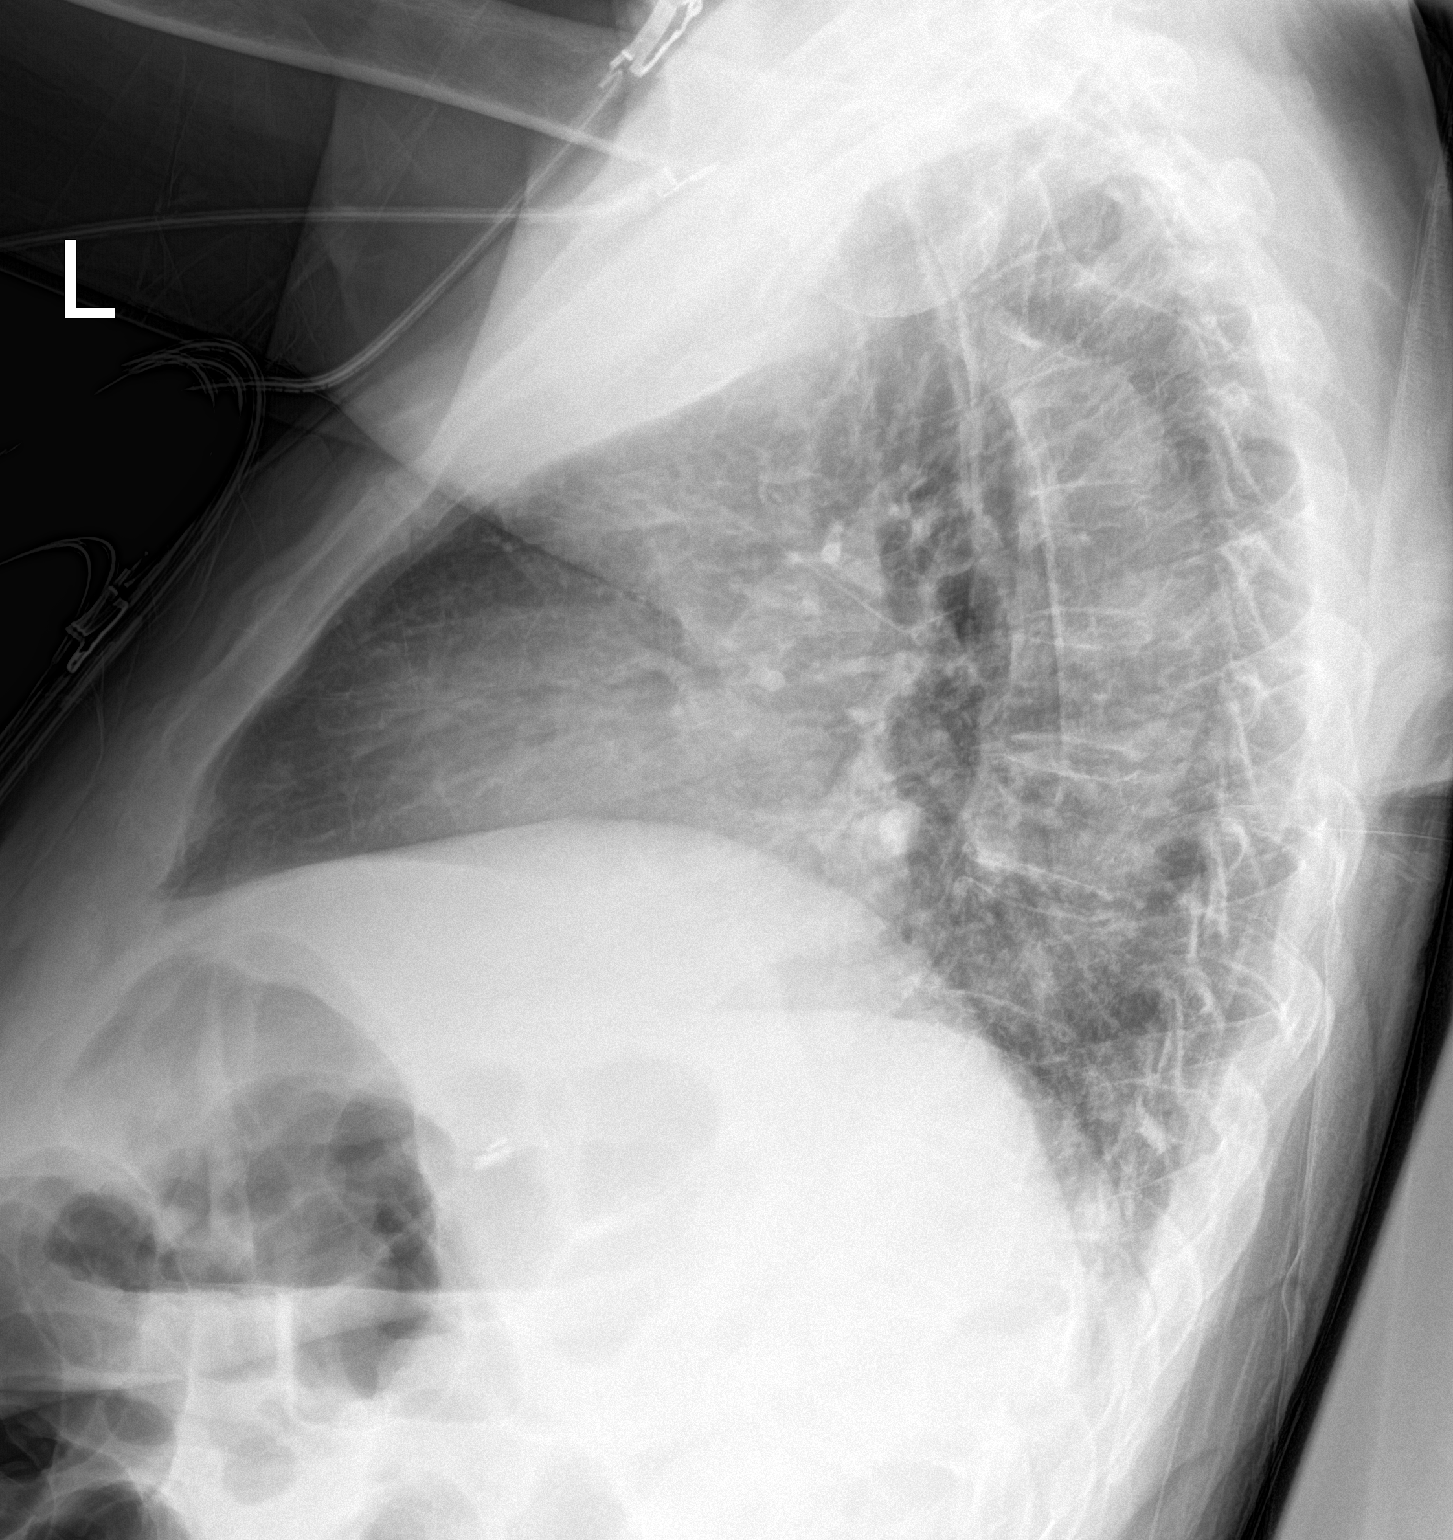
[im 2/2]
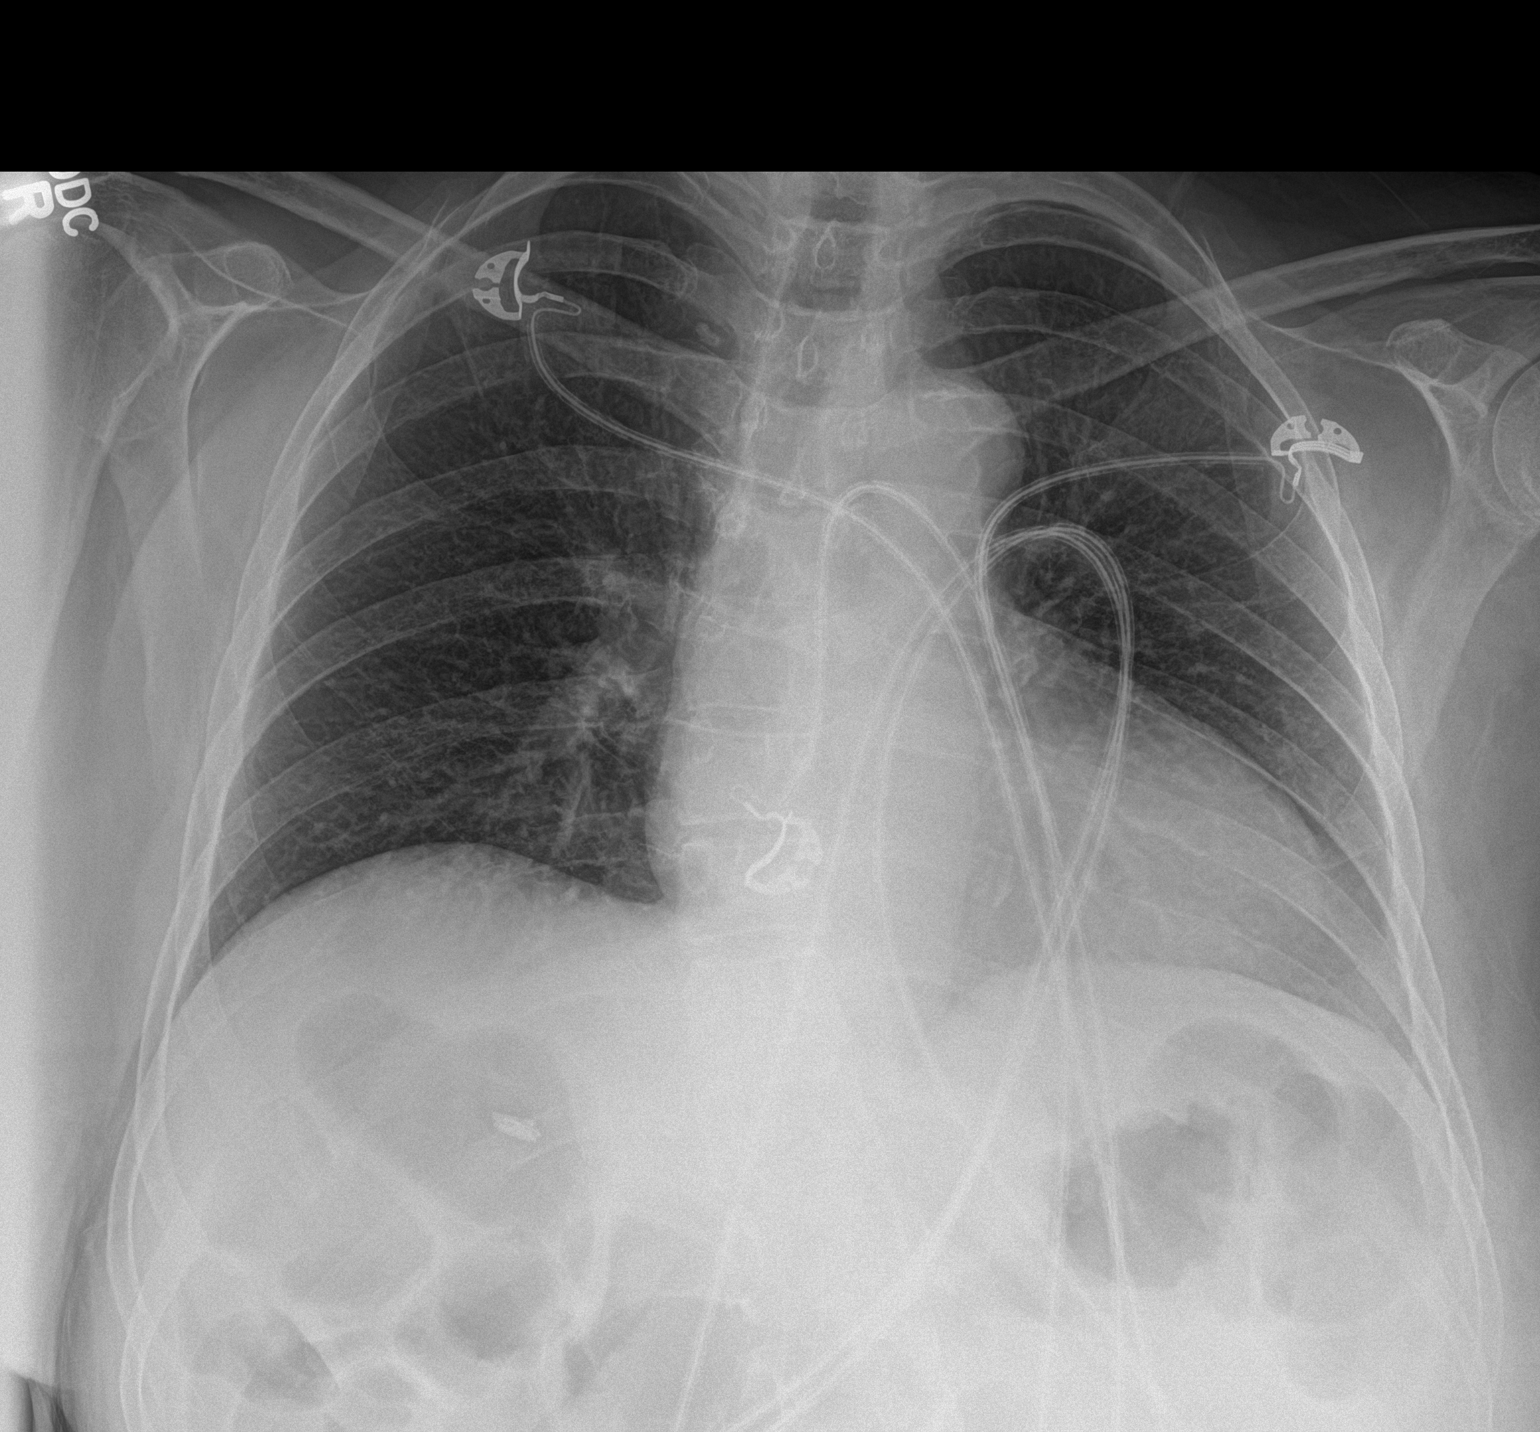

[2 of 2 positions shown; findings below may reference images not displayed]

FINDINGS: Scoliosis thoracolumbar spine. Heart size and vascular pattern
normal. Lungs clear. No pleural effusions.
IMPRESSION: No acute finding

## 2017-04-09 IMAGING — CT CT ABD-PELV W/ CM
1 of 3 series · 14 of 32 positions shown, 19 images · IV contrast (omnipaque)
Comparison: 04/03/2015

CLINICAL DATA: Pt c/o generalized abd pain with diarrhea x3 days

EXAM:
CT ABDOMEN AND PELVIS WITH CONTRAST
TECHNIQUE: Multidetector CT imaging of the abdomen and pelvis was performed
using the standard protocol following bolus administration of
intravenous contrast.
CONTRAST:  100mL OMNIPAQUE IOHEXOL 300 MG/ML  SOLN

[Series 2: routine abd pel with · axial · 0.72mm/px · z∈[-455,-30]mm · 14 of 95 slices shown, 19 images]
[im 5/95  soft-tissue]
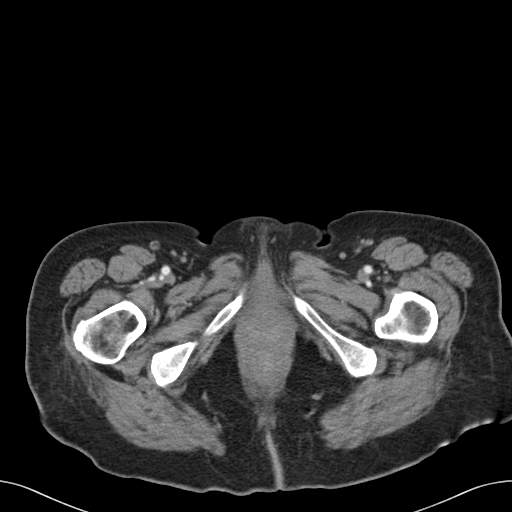
[im 5/95  bone]
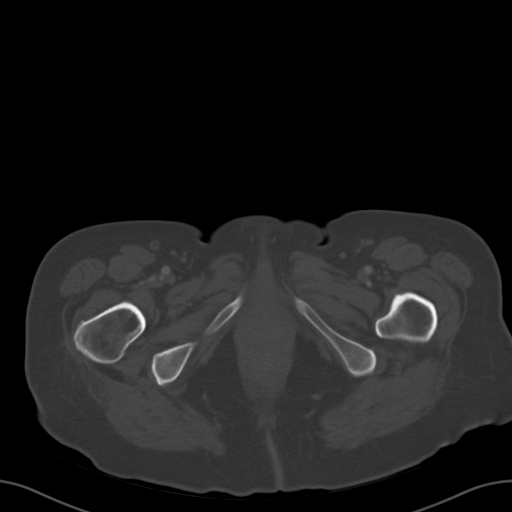
[im 15/95  soft-tissue]
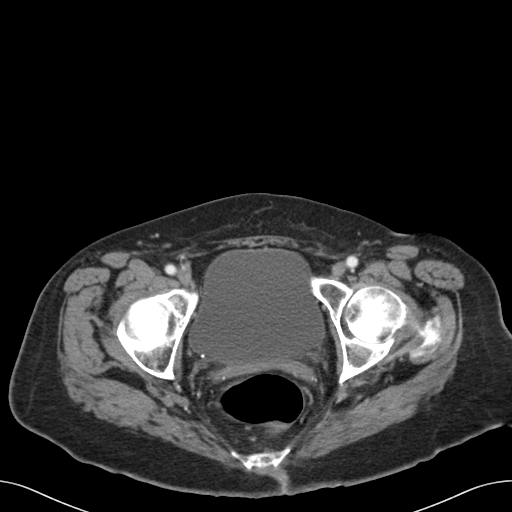
[im 20/95  soft-tissue]
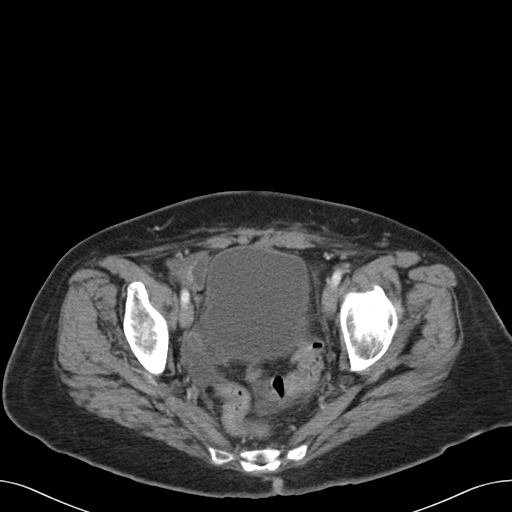
[im 25/95  soft-tissue]
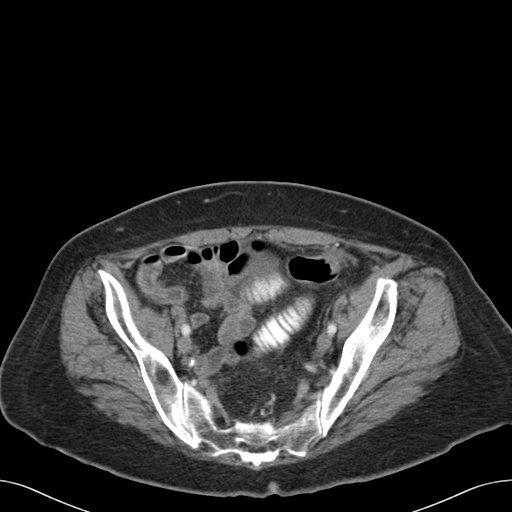
[im 35/95  soft-tissue]
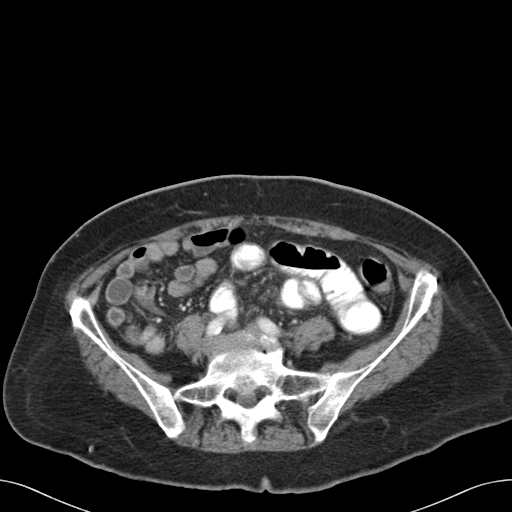
[im 40/95  soft-tissue]
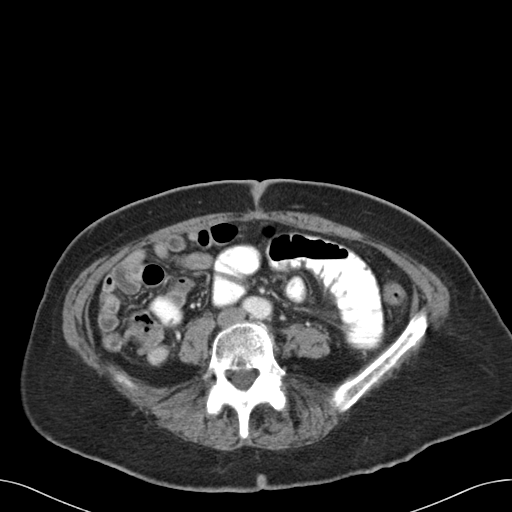
[im 50/95  soft-tissue]
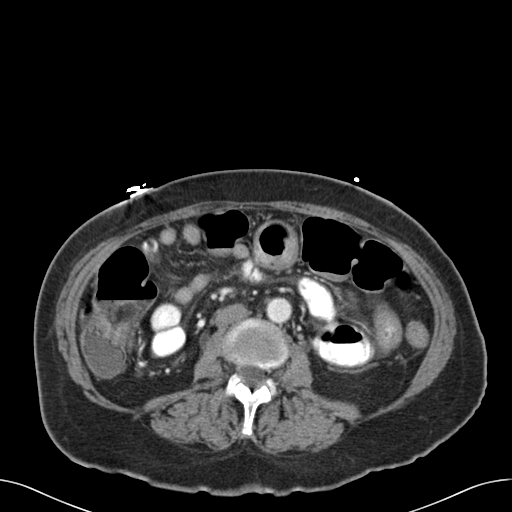
[im 55/95  soft-tissue]
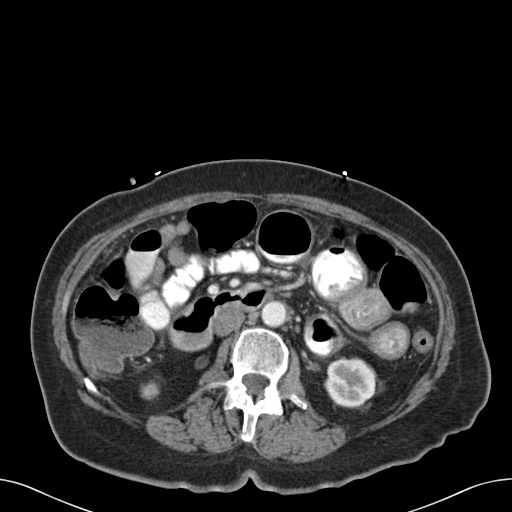
[im 60/95  soft-tissue]
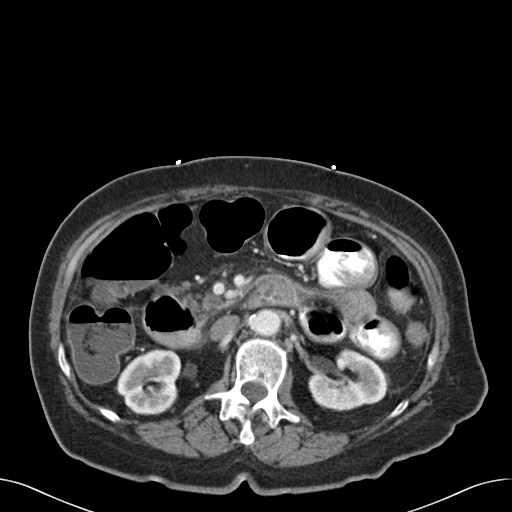
[im 60/95  bone]
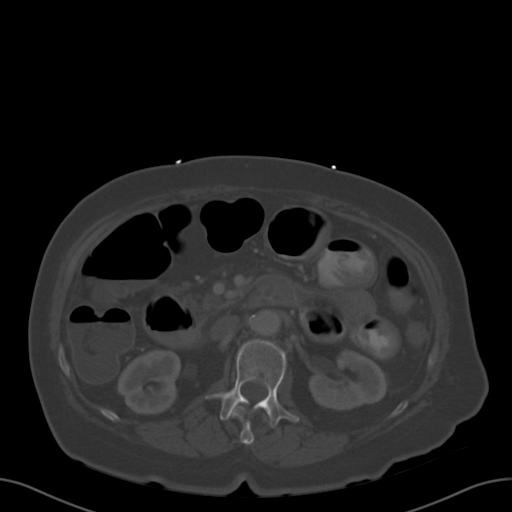
[im 70/95  soft-tissue]
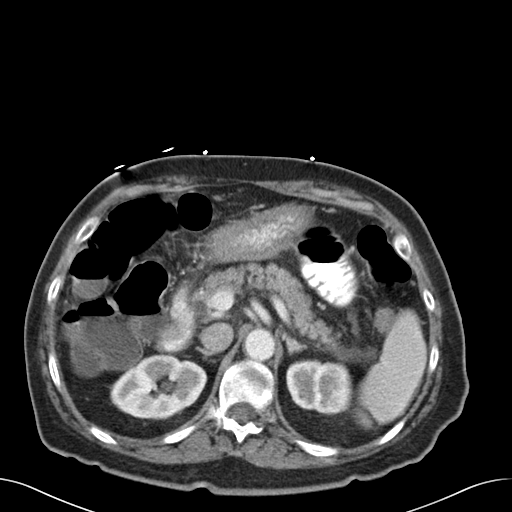
[im 75/95  soft-tissue]
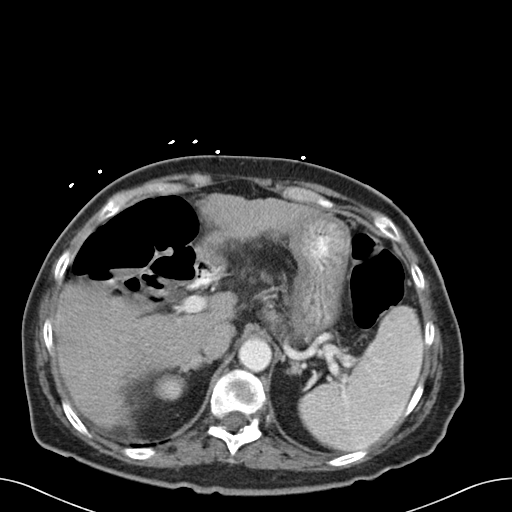
[im 75/95  lung]
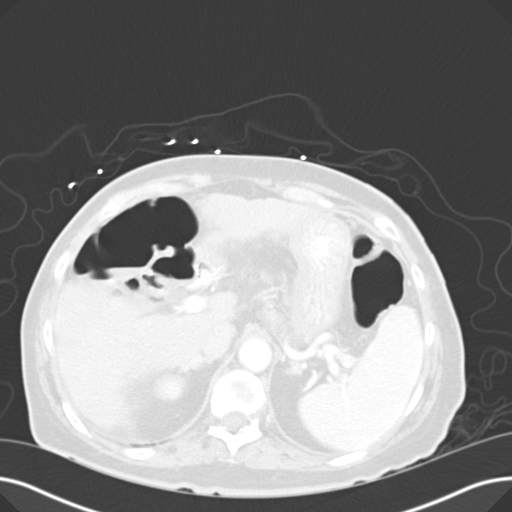
[im 80/95  soft-tissue]
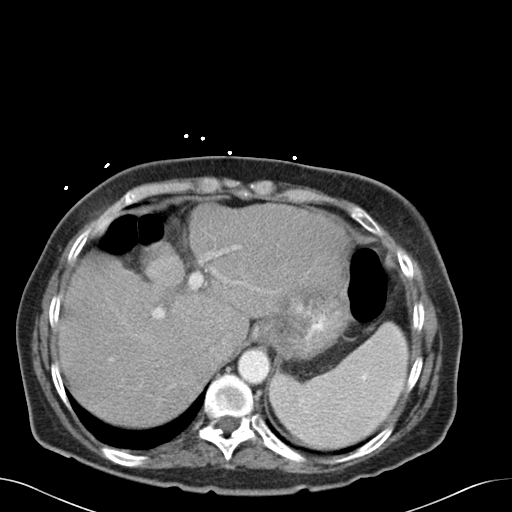
[im 80/95  lung]
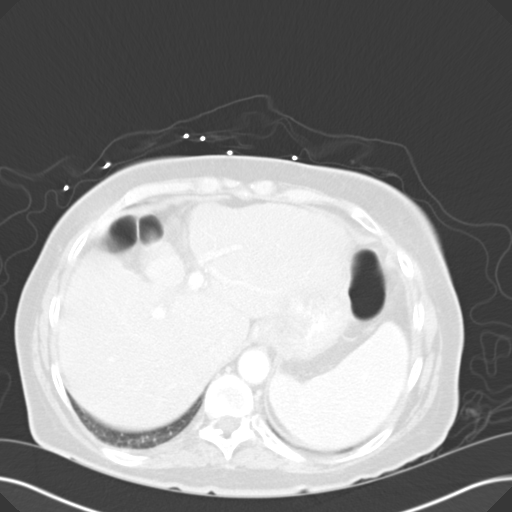
[im 85/95  lung]
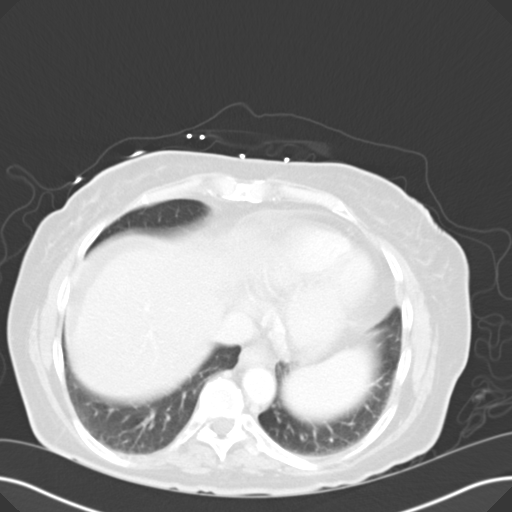
[im 90/95  soft-tissue]
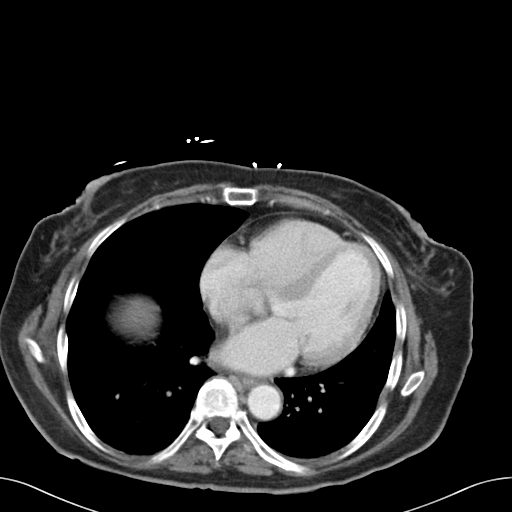
[im 90/95  lung]
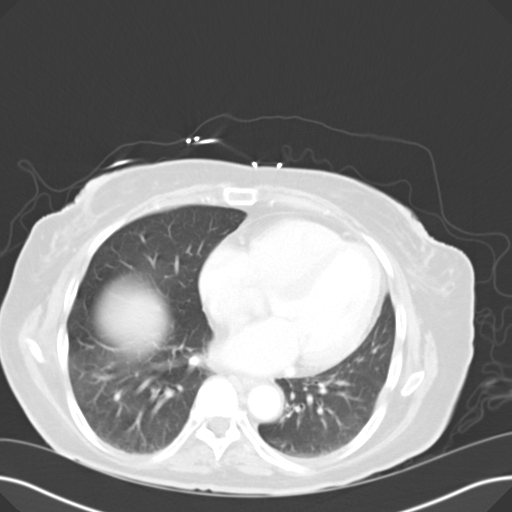

[14 of 32 positions shown; findings below may reference images not displayed]

FINDINGS: Lower chest:  Clear

Hepatobiliary: Gallbladder surgically absent

Pancreas: Normal

Spleen: Normal

Adrenals/Urinary Tract: Mild dilatation of both ureters with no
ureteral stones, likely due to mild bladder distention. 7mm
obstructing stone or pole right kidney

Stomach/Bowel: Proximal jejunum show significant wall thickening
involving several loops. She mild intussusception identified
involving left upper quadrant jejunum. Mild dilatation both proximal
and distal to this. Contrast seen well beyond this area into more
distal small bowel.

Vascular/Lymphatic: No acute findings. Mild calcification aortoiliac
vessels.

Reproductive: Not identified

Other: Small volume ascites

Musculoskeletal: No acute musculoskeletal findings
IMPRESSION: Significant inflammation involving multiple loops of proximal
jejunum with mild nonobstructing intussusception

## 2017-04-10 IMAGING — DX DG CHEST 2V
2 series · 2 of 2 positions shown · non-contrast
Comparison: PA and lateral chest 05/17/2015.

CLINICAL DATA: Epigastric pain, nausea and vomiting for 3 days.

EXAM:
CHEST  2 VIEW

[w chest lat]
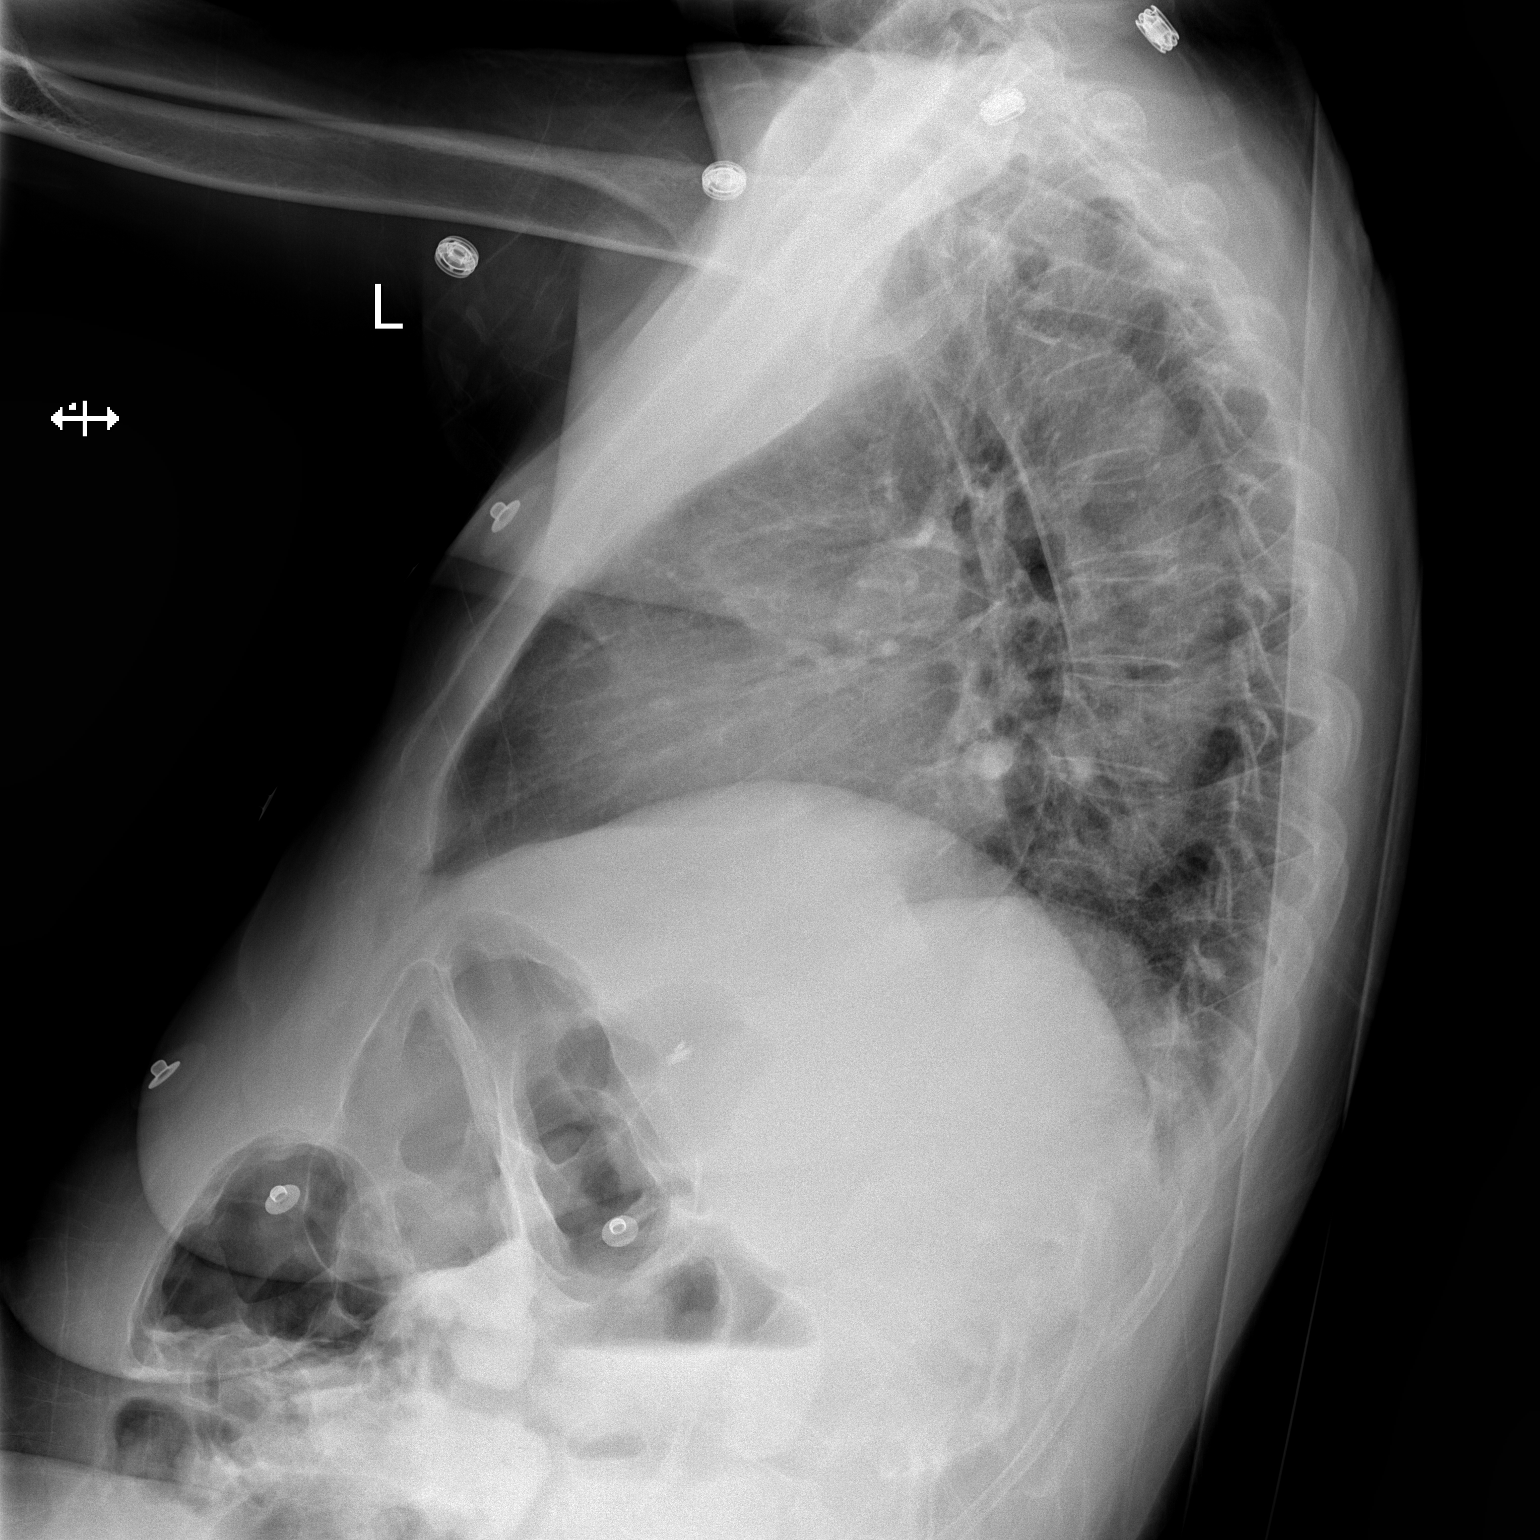

[x chest ap]
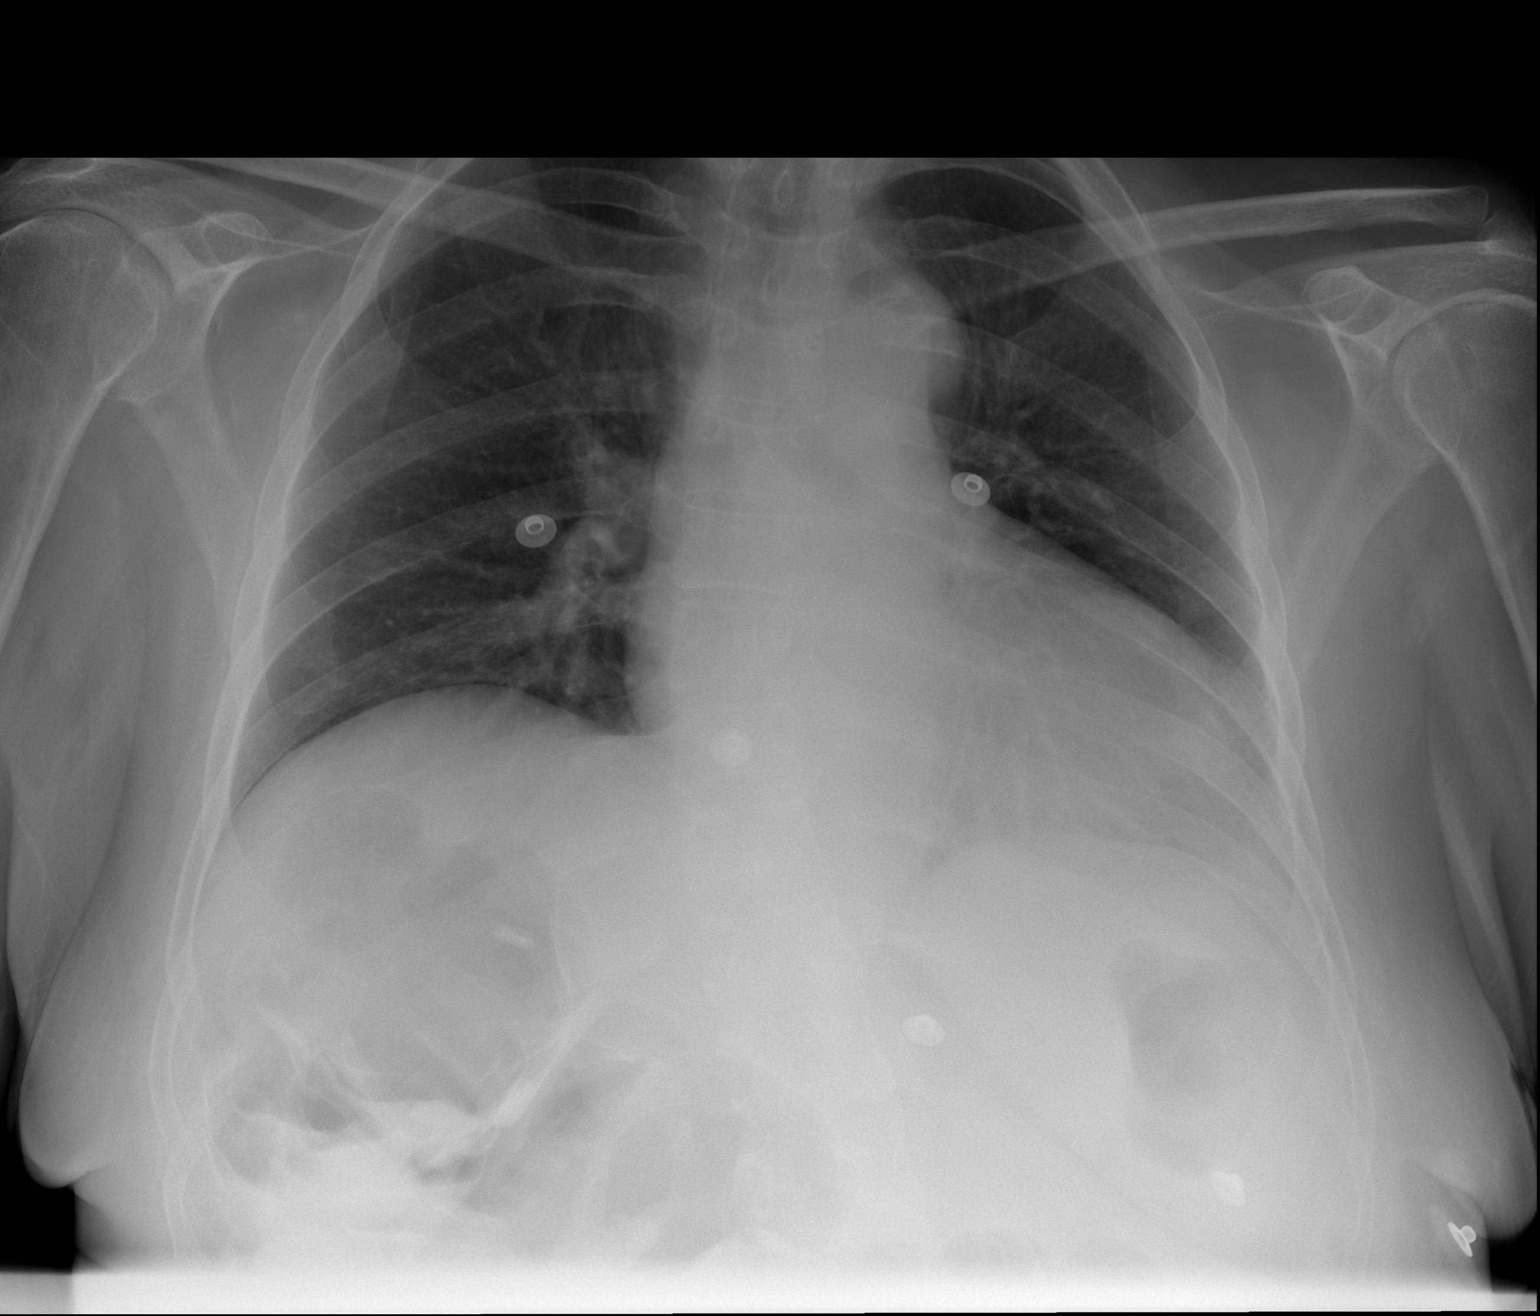

[2 of 2 positions shown; findings below may reference images not displayed]

FINDINGS: The lungs are clear. Heart size is upper normal. No pneumothorax or
pleural effusion. Scoliosis noted
IMPRESSION: No acute disease.

## 2017-04-10 IMAGING — CT CT HEAD W/O CM
2 series · 16 of 30 positions shown, 18 images · non-contrast
Comparison: CT dated 04/13/2015

CLINICAL DATA: 59-year-old female with fall and headache

EXAM:
CT HEAD WITHOUT CONTRAST
TECHNIQUE: Contiguous axial images were obtained from the base of the skull
through the vertex without intravenous contrast.

[Series 201: head w/o, idose (1) · axial · non-contrast · 0.37mm/px · z∈[+98,+208]mm · 8 of 30 slices shown, 10 images]
[im 4/30  brain]
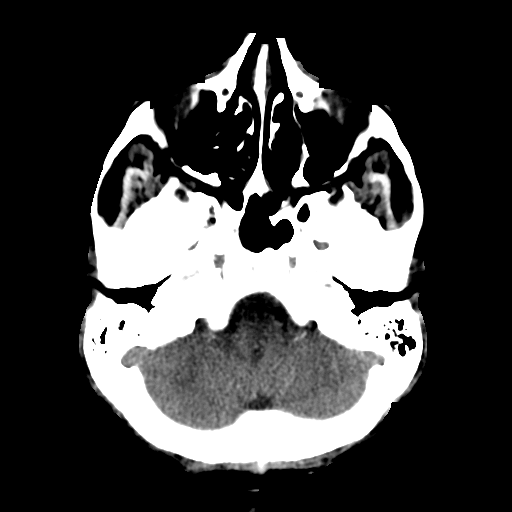
[im 4/30  bone]
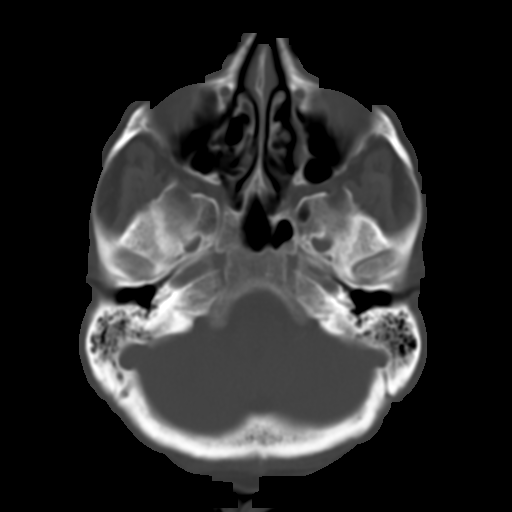
[im 7/30  brain]
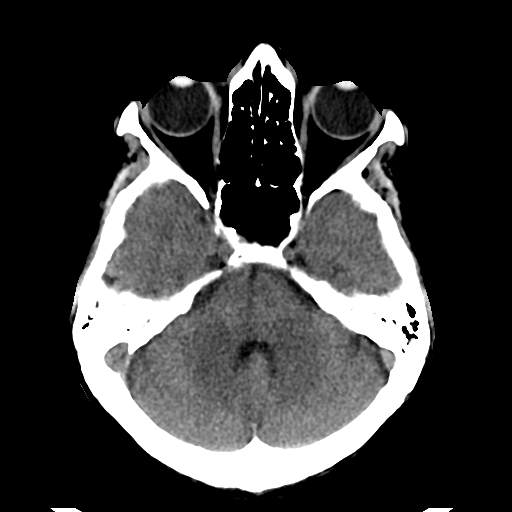
[im 10/30  brain]
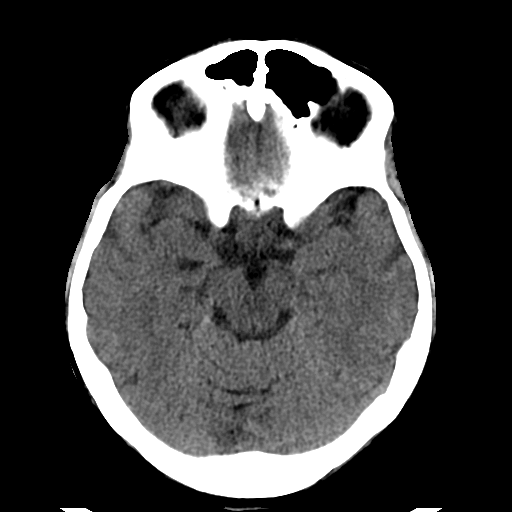
[im 13/30  brain]
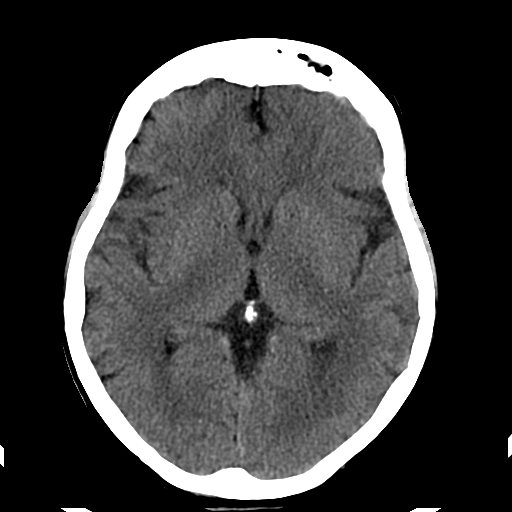
[im 17/30  brain]
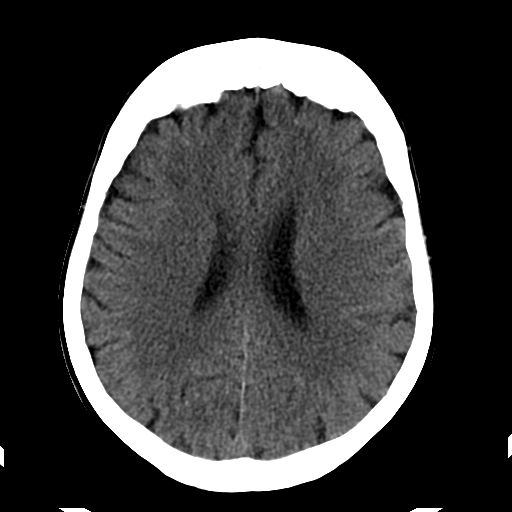
[im 17/30  bone]
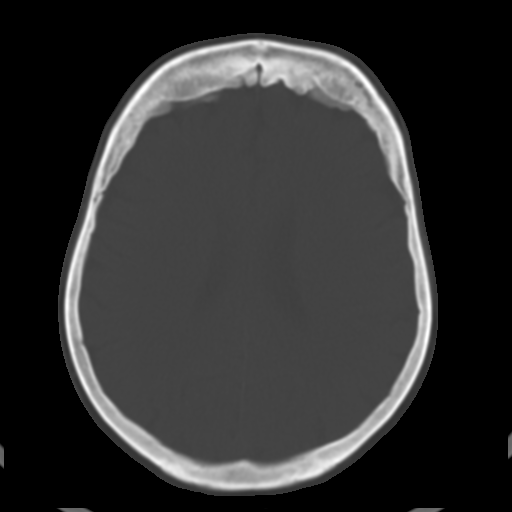
[im 20/30  brain]
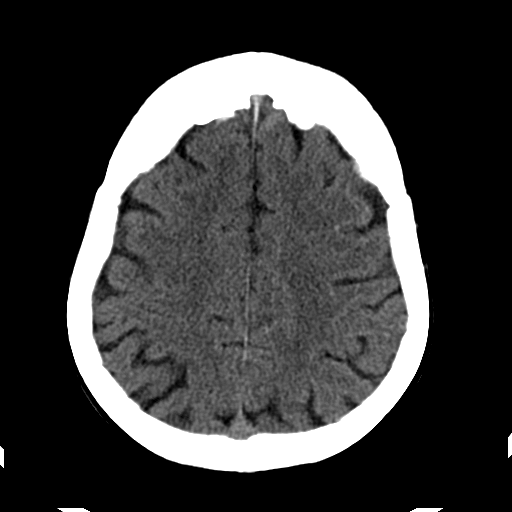
[im 23/30  brain]
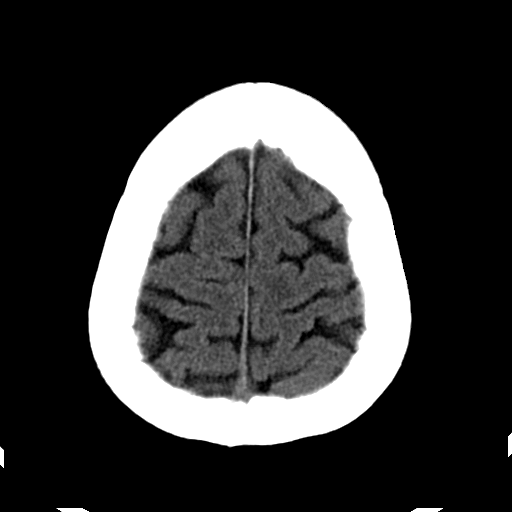
[im 26/30  brain]
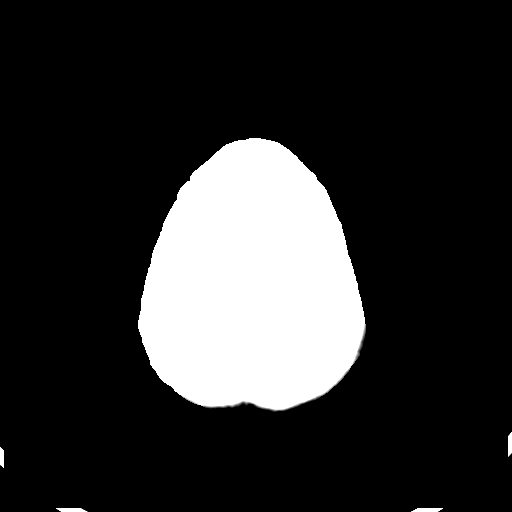

[Series 202: head w/o bone, idose (1) · axial · non-contrast · 0.37mm/px · z∈[+97,+212]mm · 8 of 60 slices shown]
[im 7/60  bone]
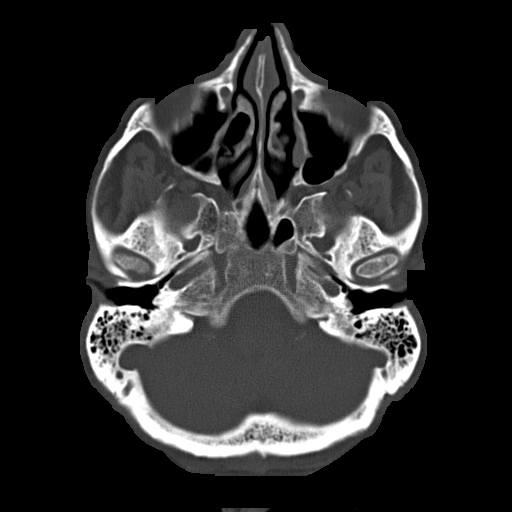
[im 13/60  bone]
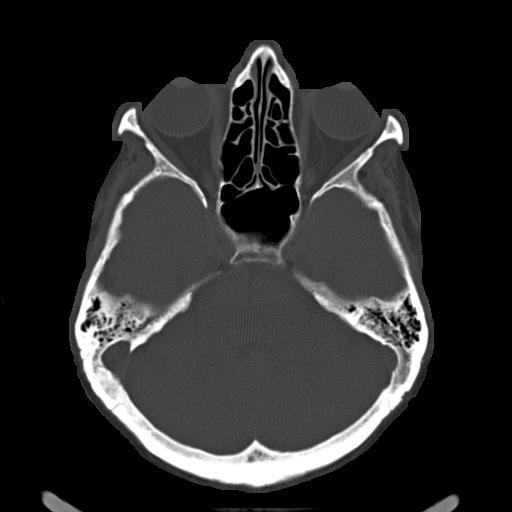
[im 19/60  bone]
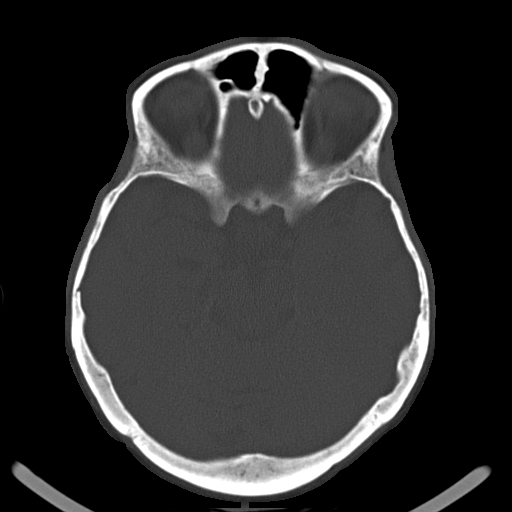
[im 25/60  bone]
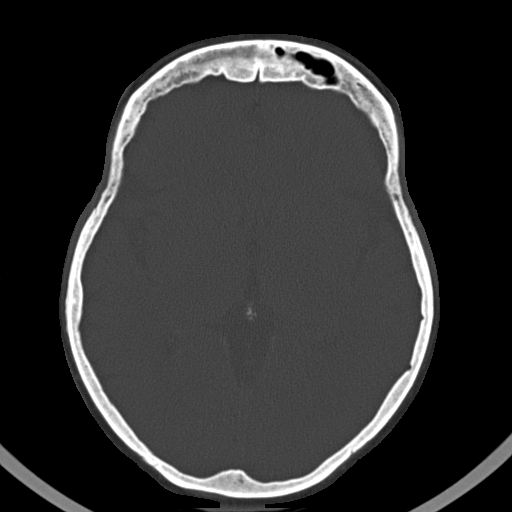
[im 35/60  bone]
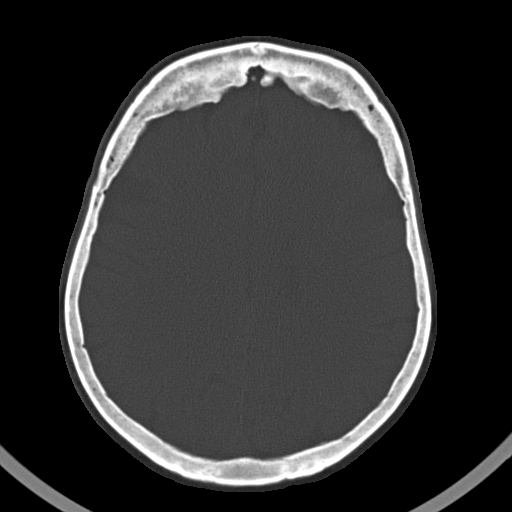
[im 41/60  bone]
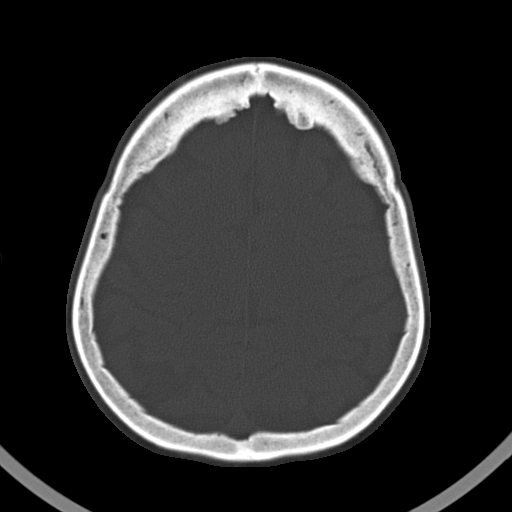
[im 47/60  bone]
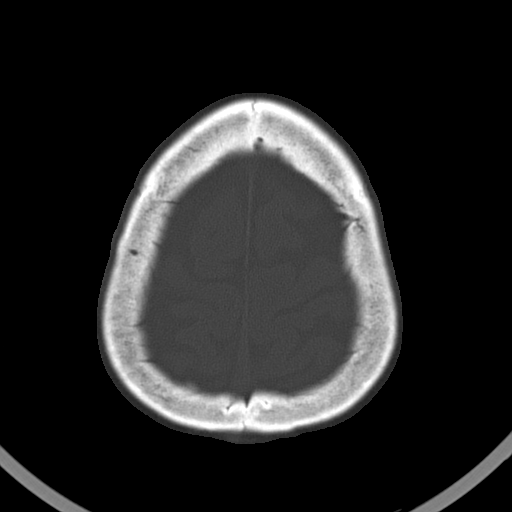
[im 53/60  bone]
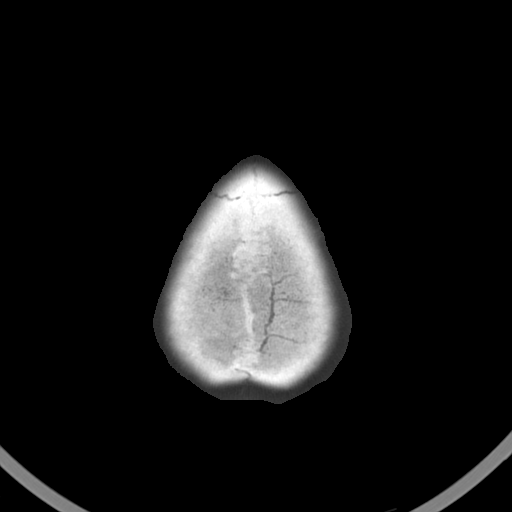

[16 of 30 positions shown; findings below may reference images not displayed]

FINDINGS: The ventricles and sulci are appropriate in size for the patient's
age. There is no intracranial hemorrhage. No mass effect or midline
shift identified. The gray-white matter differentiation is
preserved. There is no extra-axial fluid collection.

The visualized paranasal sinuses and mastoid air cells are well
aerated. The calvarium is intact.
IMPRESSION: No acute intracranial pathology.

## 2017-04-11 IMAGING — CR DG ABDOMEN 1V
1 series · 1 of 1 positions shown · non-contrast
Comparison: CT Abdomen and Pelvis 05/17/2015, and earlier

CLINICAL DATA: 59-year-old female with abdominal pain and diarrhea.
Inflamed small bowel on recent CT Abdomen and Pelvis. Initial
encounter.

EXAM:
ABDOMEN - 1 VIEW

[AP]
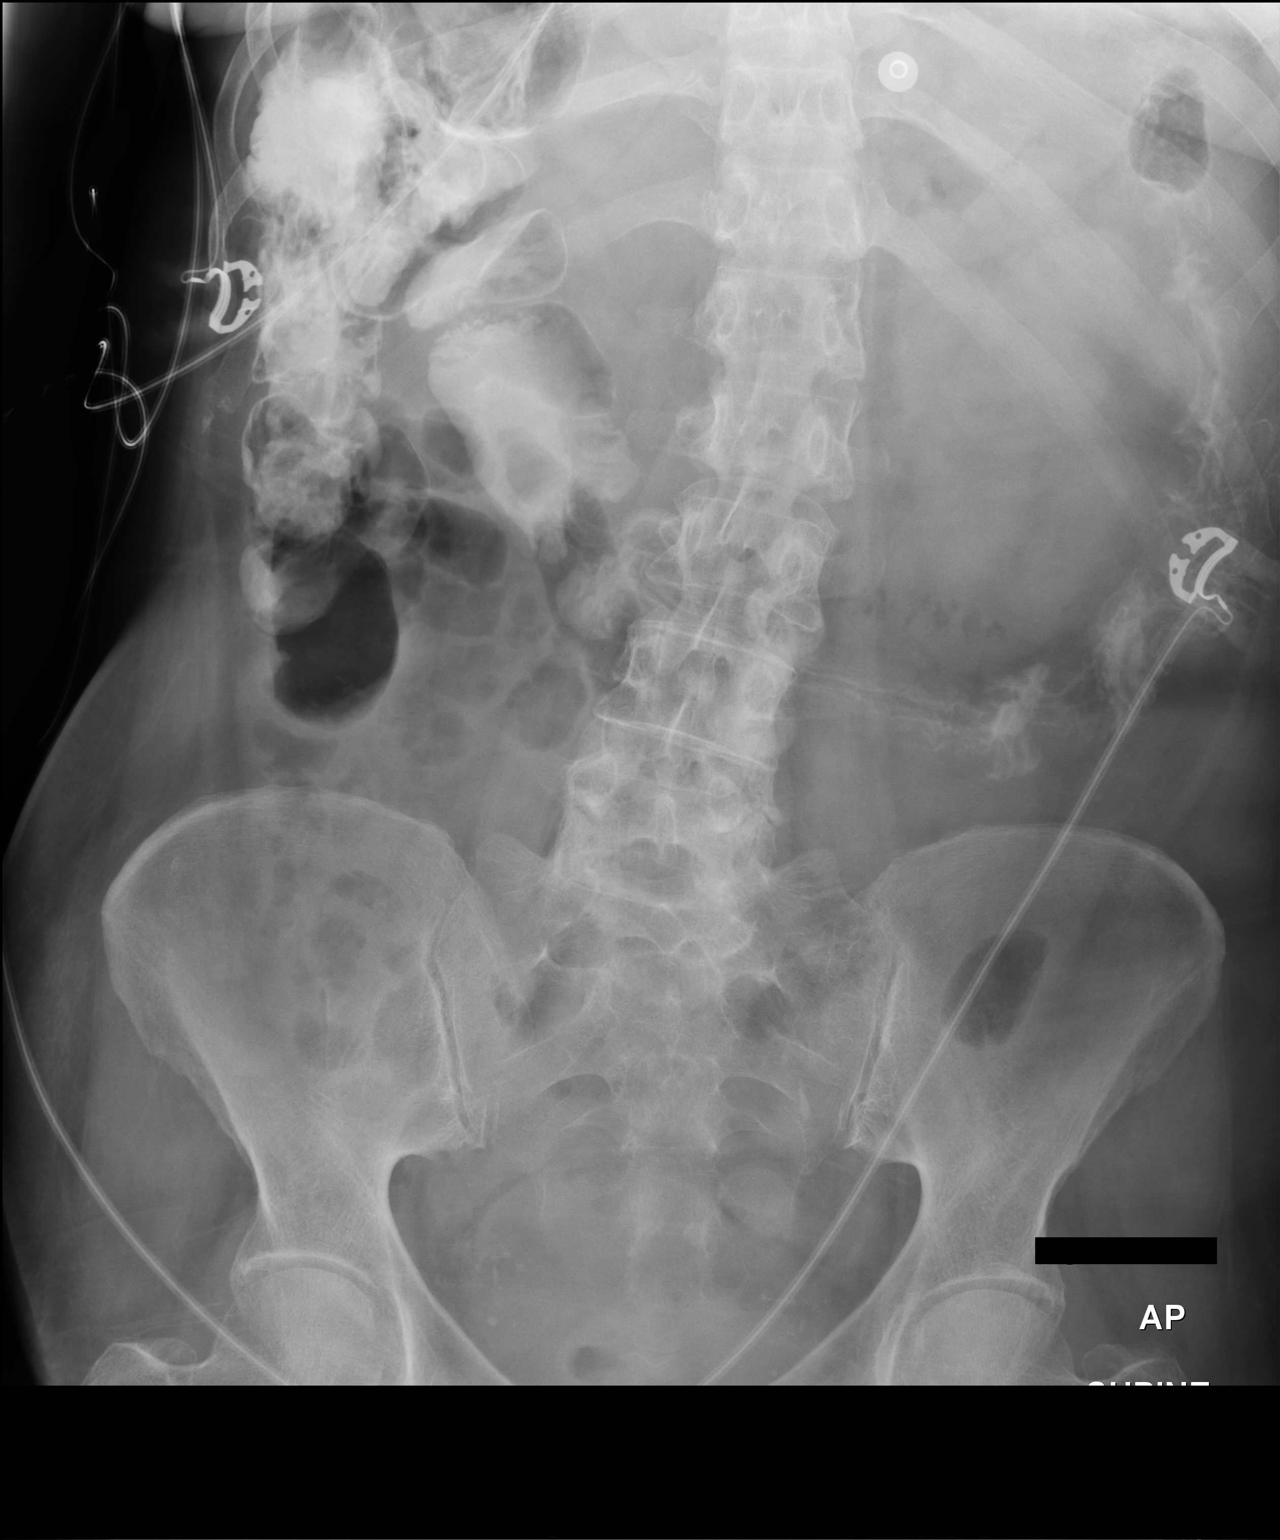

[1 of 1 positions shown; findings below may reference images not displayed]

FINDINGS: Portable AP supine view at [id number] hours. Oral contrast administered on
05/17/2015 has reached the splenic flexure. Non obstructed bowel gas
pattern. Abdominal and pelvic visceral contours are stable and
within normal limits. Stable visualized osseous structures. No
definite pneumoperitoneum on this supine view.
IMPRESSION: Normal bowel gas pattern. Oral contrast has reached the splenic
flexure.

## 2017-04-12 IMAGING — MR MR HEAD W/O CM
9 of 11 series · 34 of 48 positions shown · non-contrast
Comparison: CT head May 18, 2015 and MRI of the brain February 16, 2015

CLINICAL DATA: Syncopal episode, lost consciousness per 2-3 minutes
while walking in house. Urinary incontinence. Recent diagnosis of
atrial fibrillation. History of diabetes, cancer, hypertension.

EXAM:
MRI HEAD WITHOUT CONTRAST
TECHNIQUE: Multiplanar, multiecho pulse sequences of the brain and surrounding
structures were obtained without intravenous contrast.

[Series 3: T1 · sagittal · 5.0mm · 0.47mm/px · 3 of 23 slices shown]
[im 1/23]
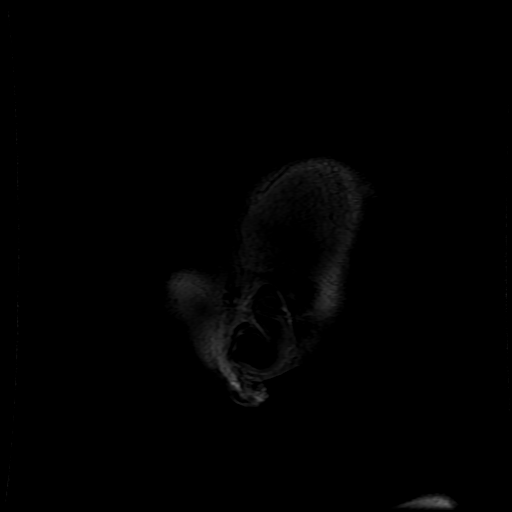
[im 12/23]
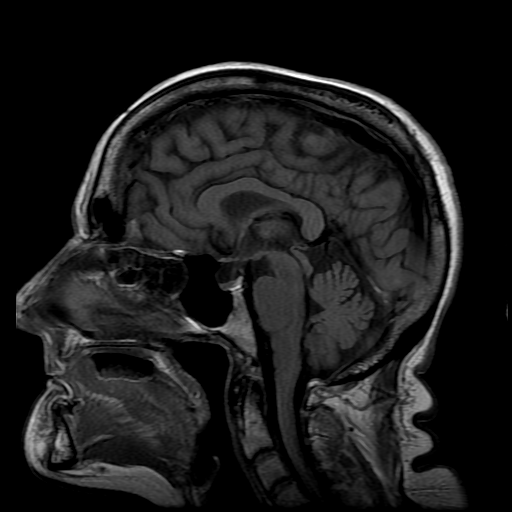
[im 23/23]
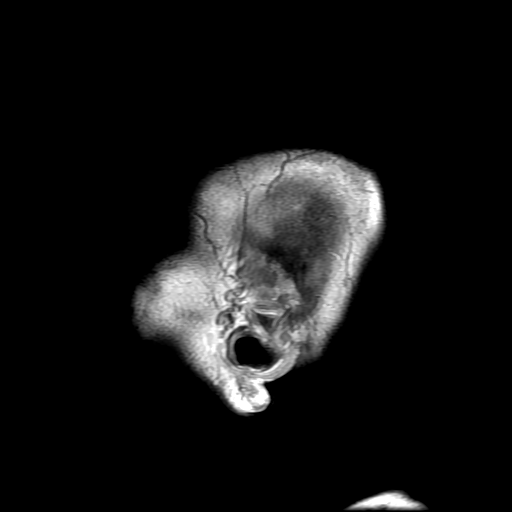

[Series 4: DWI · axial · 3.0mm · 1.02mm/px · z∈[-22,+112]mm · 8 of 94 slices shown (1 of 4)]
[im 1/94]
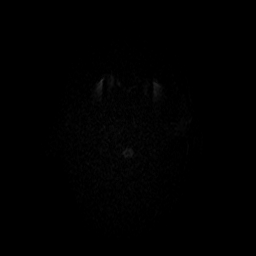
[im 11/94]
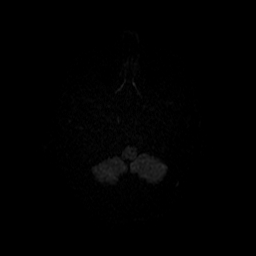
[im 32/94]
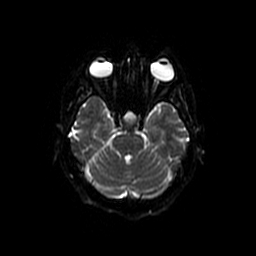
[im 42/94]
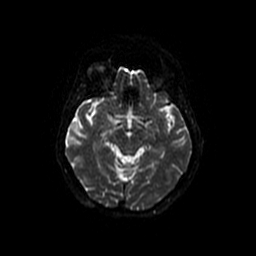
[im 52/94]
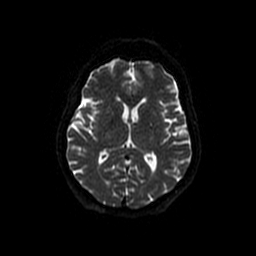
[im 63/94]
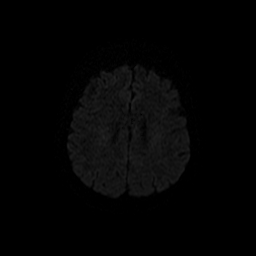
[im 83/94]
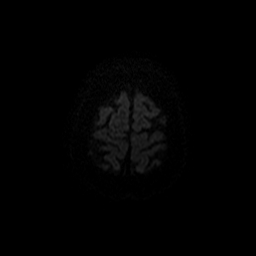
[im 94/94]
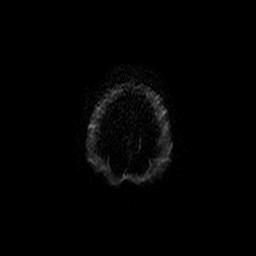

[Series 5: T2 · axial · 5.0mm · 0.43mm/px · z∈[-26,+108]mm · 2 of 24 slices shown (1 of 3)]
[im 1/24]
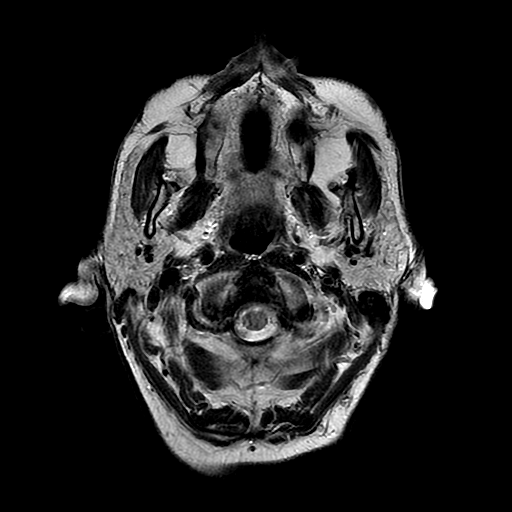
[im 24/24]
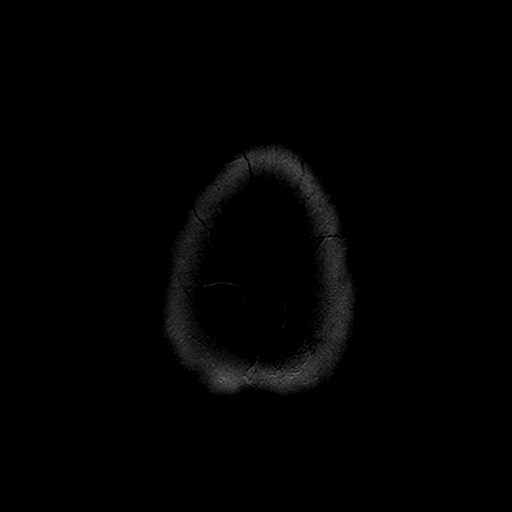

[Series 6: FLAIR · axial · 5.0mm · 0.43mm/px · z∈[-26,+108]mm · 2 of 24 slices shown]
[im 1/24]
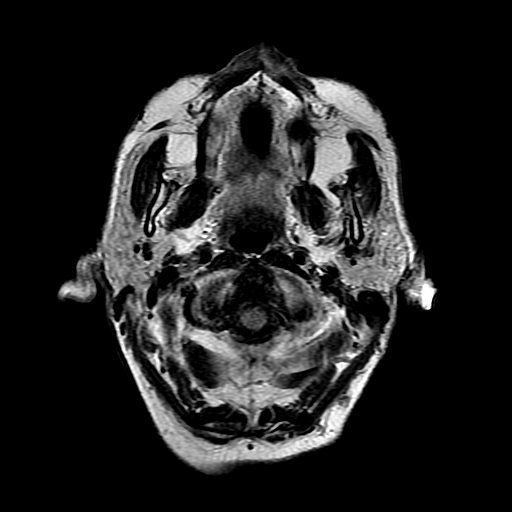
[im 24/24]
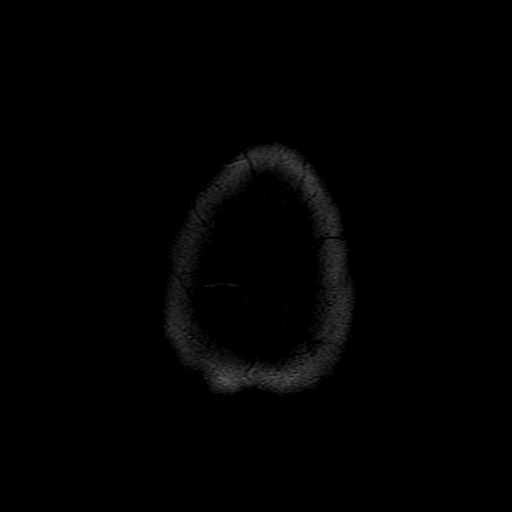

[Series 7: DWI · coronal · 5.0mm · 1.09mm/px · 6 of 62 slices shown (2 of 4)]
[im 1/62]
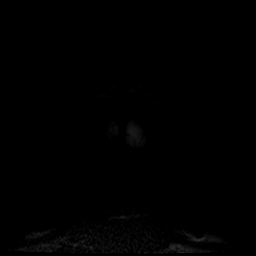
[im 13/62]
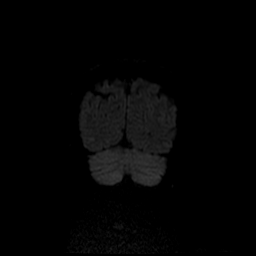
[im 25/62]
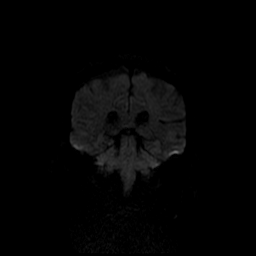
[im 37/62]
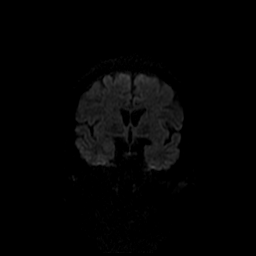
[im 49/62]
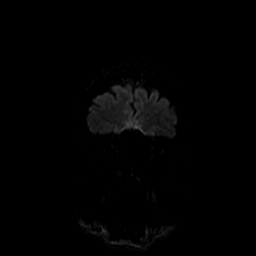
[im 62/62]
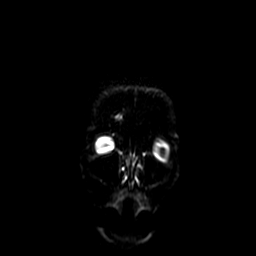

[Series 10: T2 · coronal · 5.0mm · 0.39mm/px · 2 of 24 slices shown (2 of 3)]
[im 1/24]
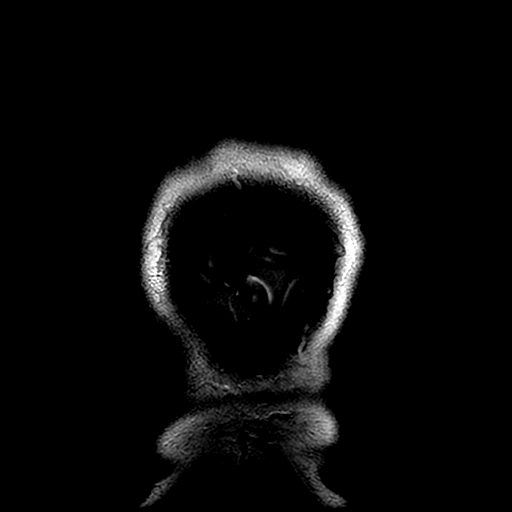
[im 24/24]
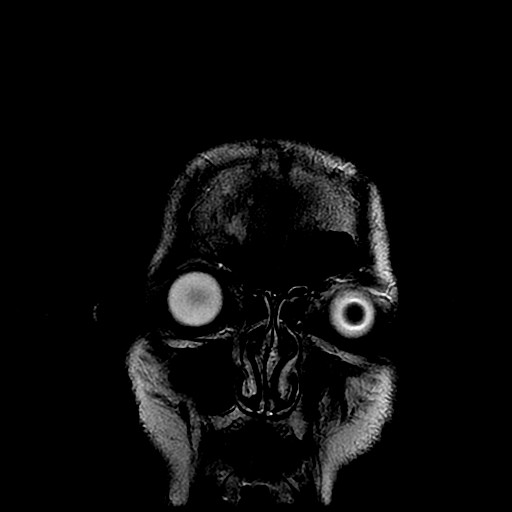

[Series 11: T2 · coronal · 3.0mm · 0.35mm/px · 3 of 29 slices shown (3 of 3)]
[im 1/29]
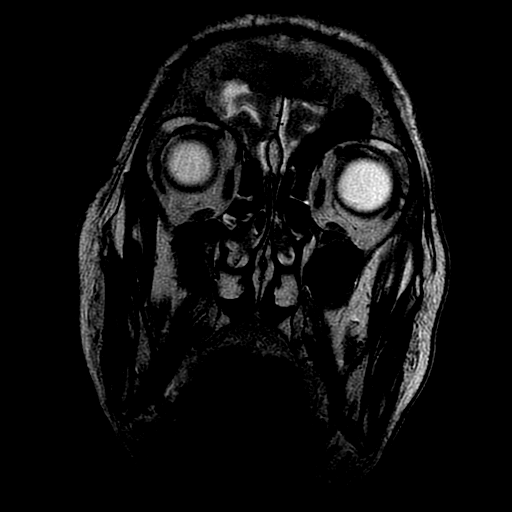
[im 15/29]
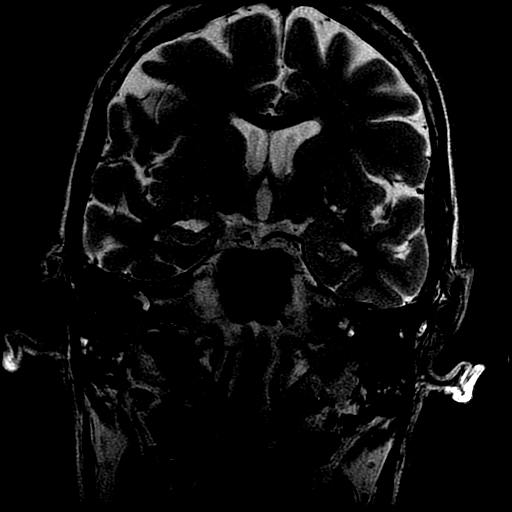
[im 29/29]
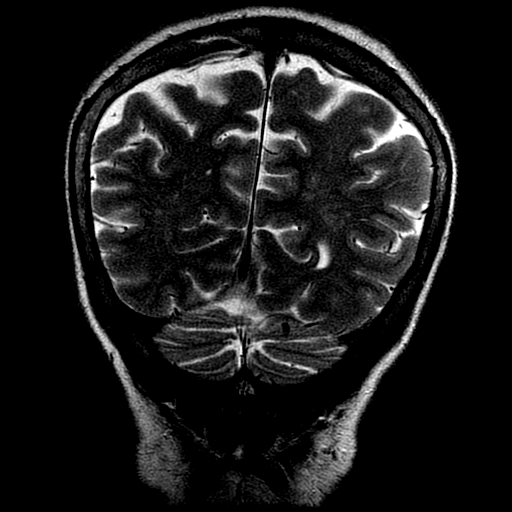

[Series 400: DWI · axial · 3.0mm · 1.02mm/px · z∈[-22,+112]mm · 5 of 47 slices shown (3 of 4)]
[im 1/47]
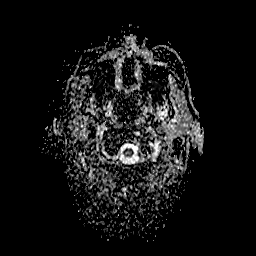
[im 12/47]
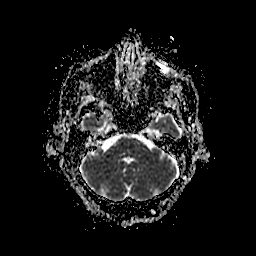
[im 24/47]
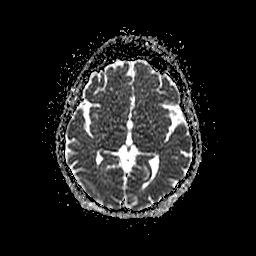
[im 35/47]
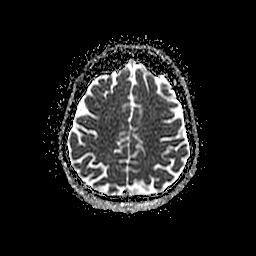
[im 47/47]
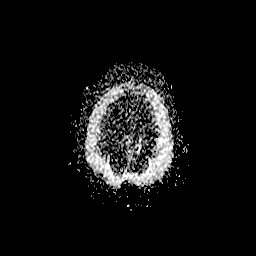

[Series 700: DWI · coronal · 5.0mm · 1.09mm/px · 3 of 31 slices shown (4 of 4)]
[im 1/31]
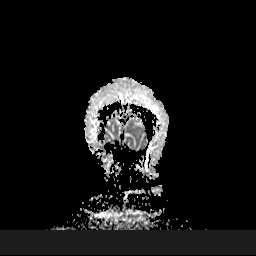
[im 16/31]
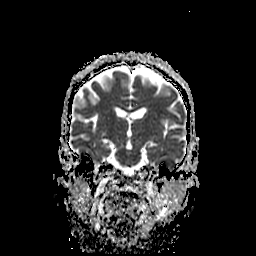
[im 31/31]
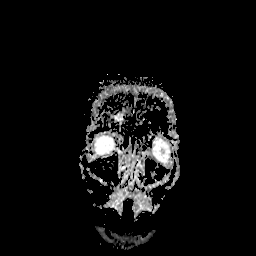

[34 of 48 positions shown; findings below may reference images not displayed]

FINDINGS: The ventricles and sulci are normal for patient's age. No suspicious
parenchymal signal, mass lesions, mass effect. No reduced diffusion
to suggest acute ischemia. No susceptibility artifact to suggest
hemorrhage. Scattered subcentimeter supratentorial white matter T2
hyperintensities.

No abnormal extra-axial fluid collections. No extra-axial masses
though, contrast enhanced sequences would be more sensitive. Normal
major intracranial vascular flow voids seen at the skull base.
Bilateral hippocampi demonstrate normal size, morphology and signal
characteristics.

Ocular globes and orbital contents are unremarkable though not
tailored for evaluation. No abnormal sellar expansion. Small
bilateral mastoid effusions. Paranasal sinuses are well-aerated. No
suspicious calvarial bone marrow signal. No abnormal sellar
expansion. Craniocervical junction maintained. Patient is
edentulous.
IMPRESSION: No acute intracranial process, specifically no acute ischemia.

Similar mild chronic small vessel ischemic disease.

## 2017-04-19 IMAGING — CR DG CHEST 1V PORT
1 series · 1 of 1 positions shown · non-contrast
Comparison: 05/18/2015

CLINICAL DATA: Fall today

EXAM:
PORTABLE CHEST - 1 VIEW

[AP]
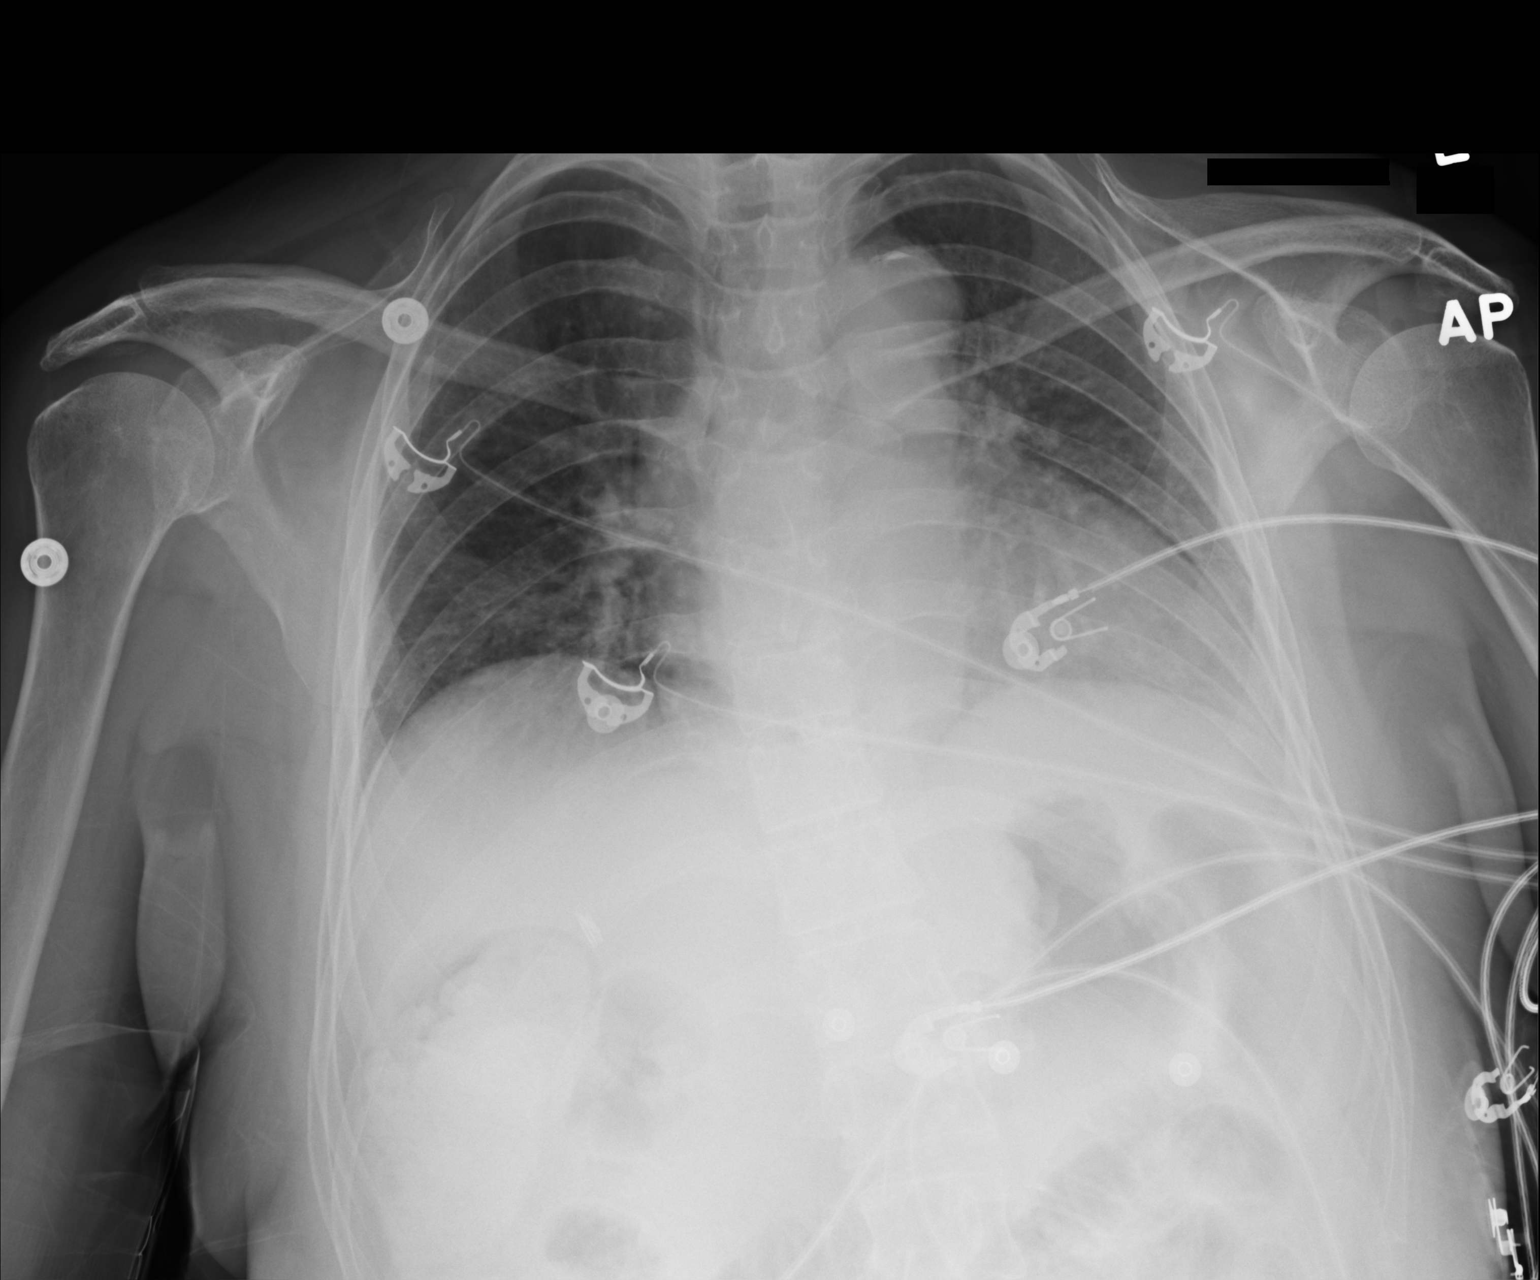

[1 of 1 positions shown; findings below may reference images not displayed]

FINDINGS: Cardiomediastinal silhouette is stable. No infiltrate or pulmonary
edema. Mild basilar atelectasis. There is no pneumothorax. No gross
fractures are noted.
IMPRESSION: No active disease.

## 2017-04-19 IMAGING — CT CT HEAD W/O CM
3 of 5 series · 15 of 47 positions shown, 18 images · non-contrast
Comparison: None.

CLINICAL DATA: 59-year-old female with fall

EXAM:
CT HEAD WITHOUT CONTRAST
CT CERVICAL SPINE WITHOUT CONTRAST
TECHNIQUE: Multidetector CT imaging of the head and cervical spine was
performed following the standard protocol without intravenous
contrast. Multiplanar CT image reconstructions of the cervical spine
were also generated.

[Series 7: coronals · coronal · 0.30mm/px · 3 of 46 slices shown]
[im 16/46  brain]
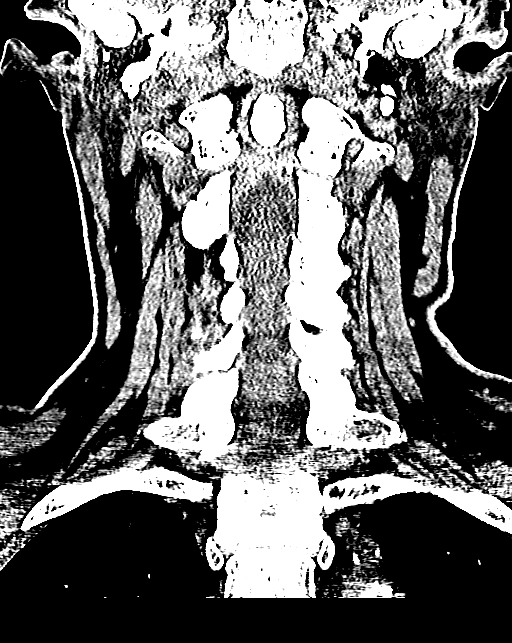
[im 21/46  brain]
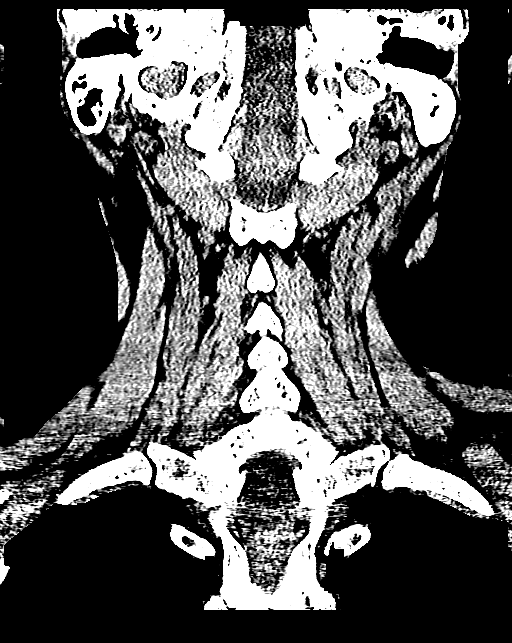
[im 26/46  brain]
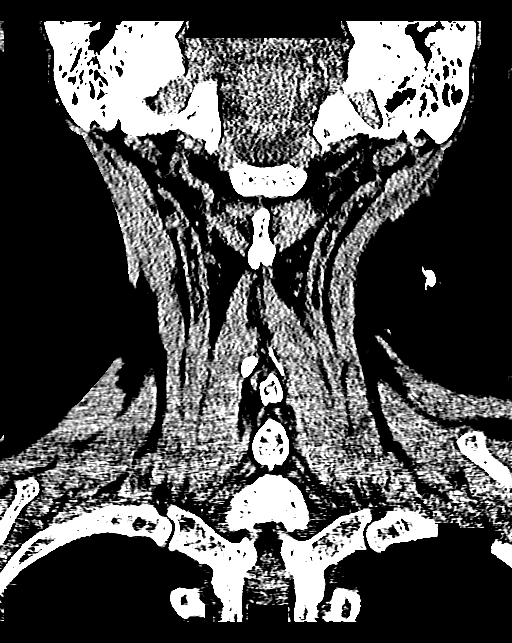

[Series 8: sagittals · sagittal · 0.31mm/px · 3 of 39 slices shown]
[im 13/39  brain]
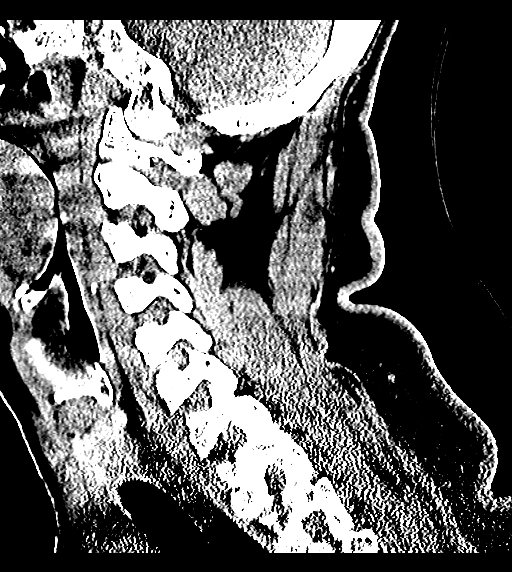
[im 20/39  brain]
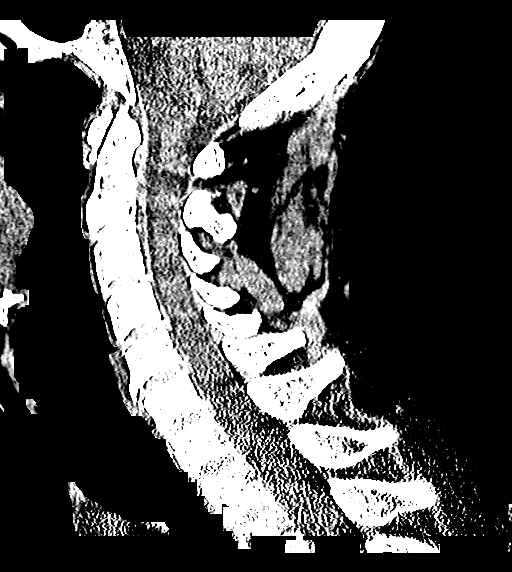
[im 26/39  brain]
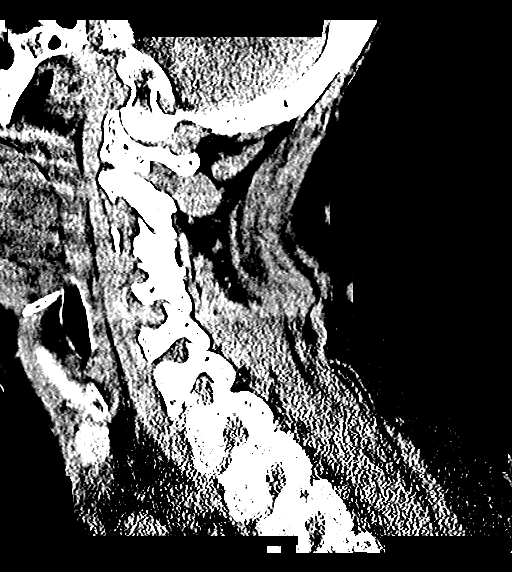

[Series 9: orthogonals · axial · 0.27mm/px · z∈[+1479,+1606]mm · 9 of 82 slices shown, 12 images]
[im 8/82  brain]
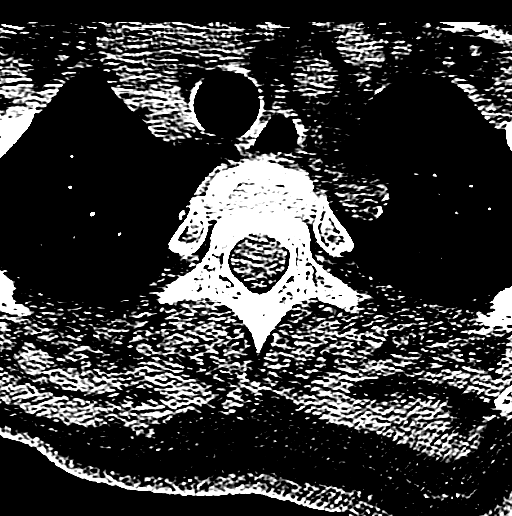
[im 8/82  bone]
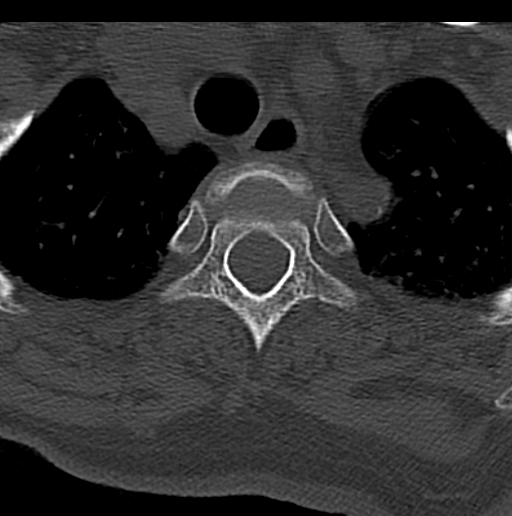
[im 15/82  brain]
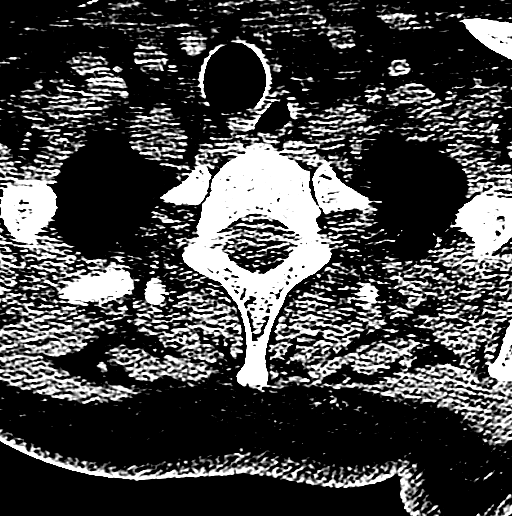
[im 23/82  brain]
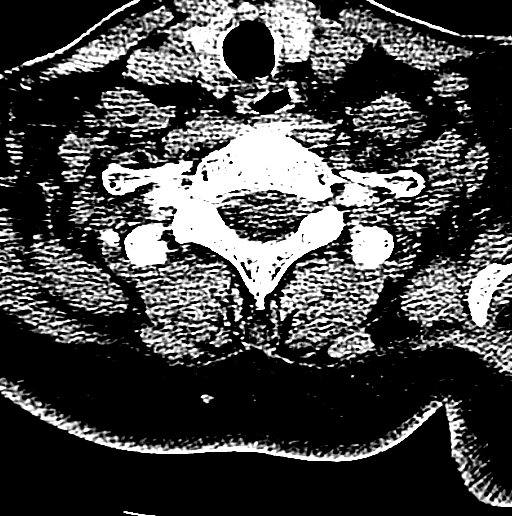
[im 30/82  brain]
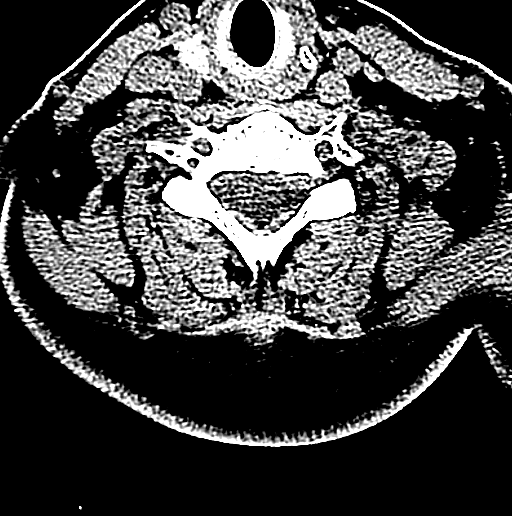
[im 45/82  brain]
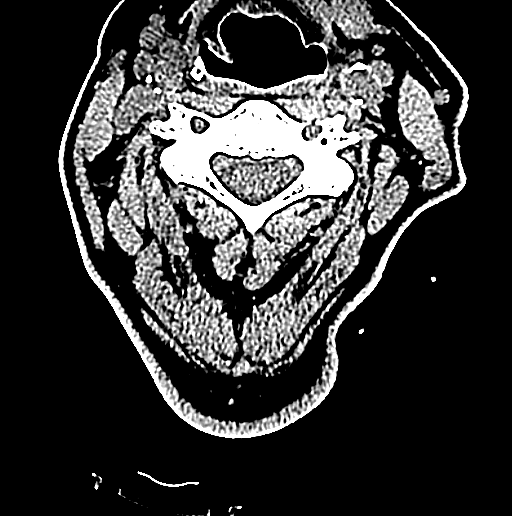
[im 45/82  bone]
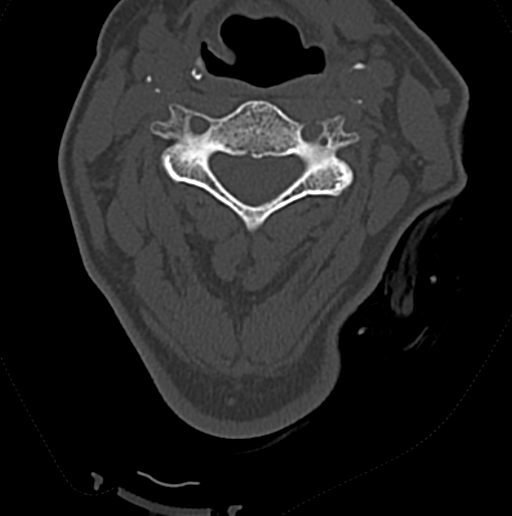
[im 52/82  brain]
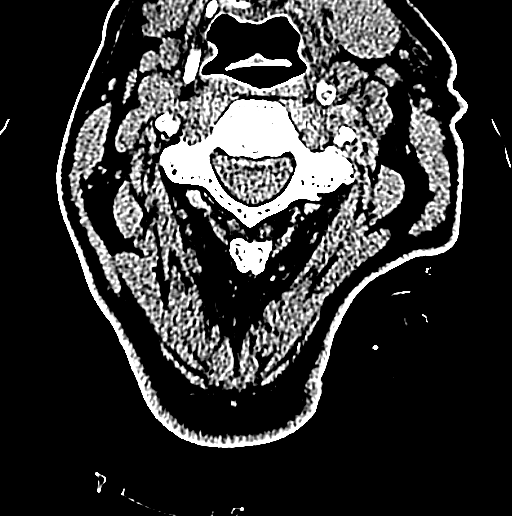
[im 59/82  brain]
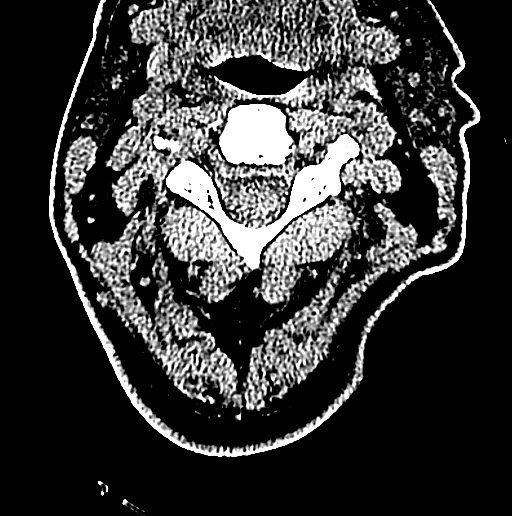
[im 67/82  brain]
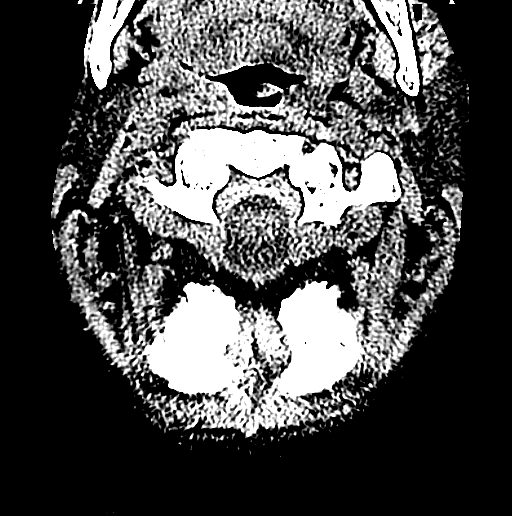
[im 74/82  brain]
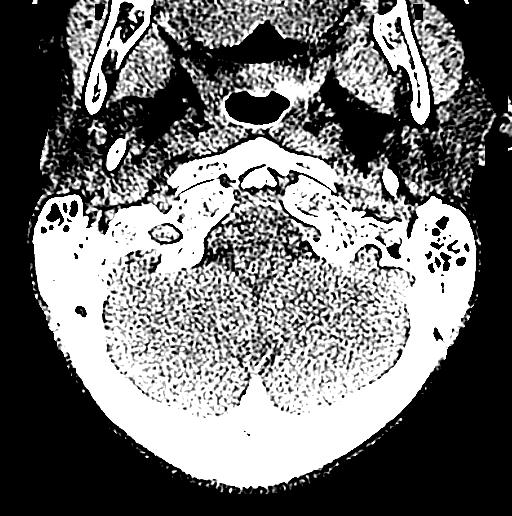
[im 74/82  bone]
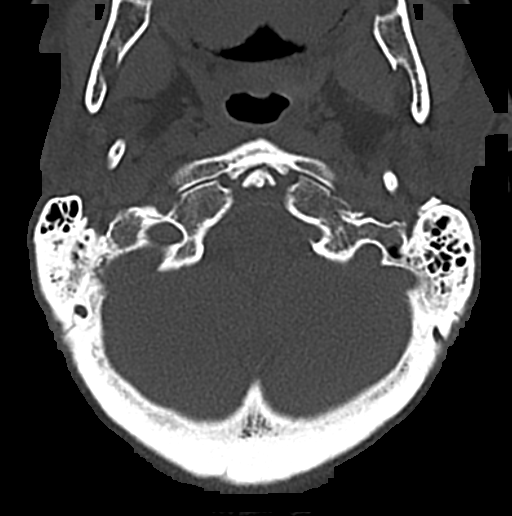

[15 of 47 positions shown; findings below may reference images not displayed]

FINDINGS: CT HEAD FINDINGS

The ventricles and sulci are appropriate in size for patient's age.
Mild periventricular and deep white matter hypodensities represent
chronic microvascular ischemic changes. There is no intracranial
hemorrhage. No mass effect or midline shift identified.

The visualized paranasal sinuses and mastoid air cells are well
aerated. The calvarium is intact.

CT CERVICAL SPINE FINDINGS

There is no acute fracture or subluxation of the cervical spine.Mild
multilevel degenerative changes.The odontoid and spinous processes
are intact.There is normal anatomic alignment of the C1-C2 lateral
masses. The visualized soft tissues appear unremarkable.
IMPRESSION: No acute intracranial hemorrhage.

Mild chronic microvascular ischemic changes.

No acute/traumatic cervical spine pathology.

## 2017-04-20 IMAGING — CR DG ABD PORTABLE 1V
1 series · 1 of 1 positions shown · non-contrast
Comparison: Abdominal radiograph 05/19/2015. Chest x-ray 05/27/2015

CLINICAL DATA: Abdominal and chest pain today.

EXAM:
PORTABLE ABDOMEN - 1 VIEW; PORTABLE CHEST - 1 VIEW

[AP]
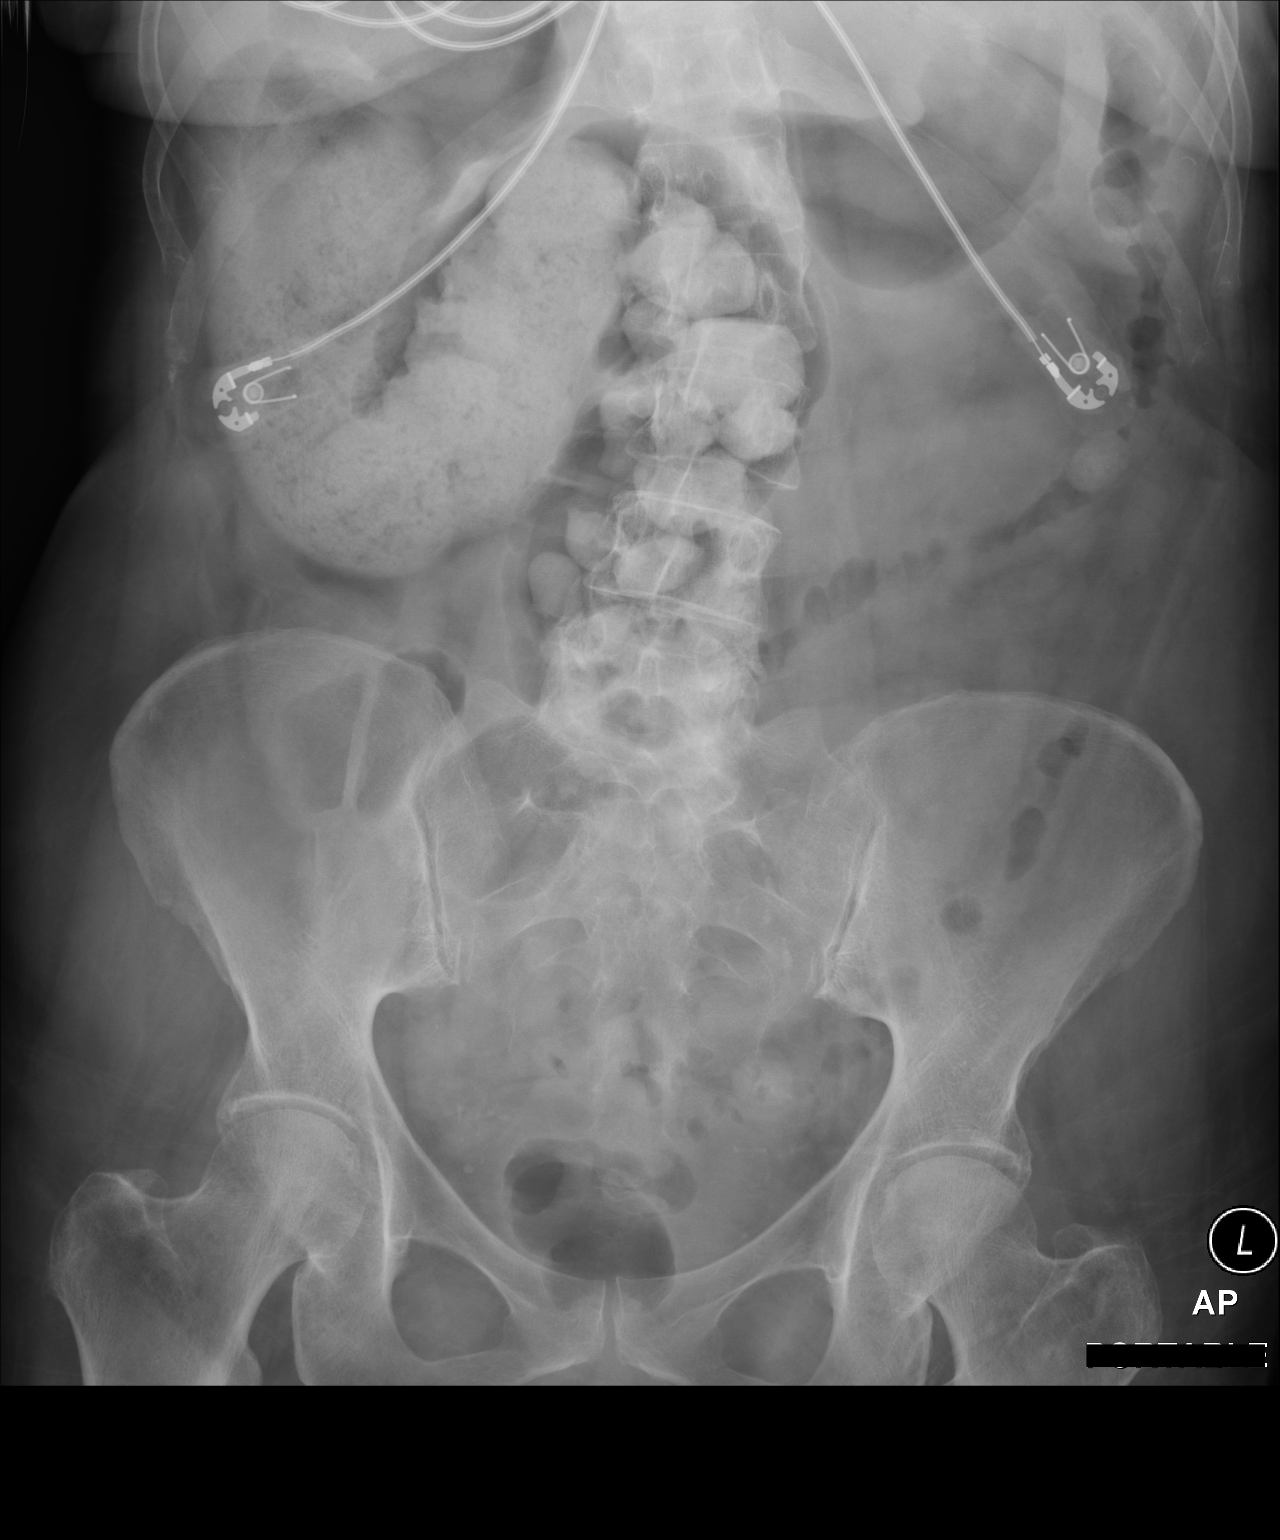

[1 of 1 positions shown; findings below may reference images not displayed]

FINDINGS: One view chest:

The heart is enlarged but stable. There is a left ventricular
configuration. Minimal streaky atelectasis but no infiltrates or
effusions. The bony thorax is intact.

One-view abdomen: Moderate persistent contrast in the right and
proximal transverse colon. The rest of the colon is decompressed.
Could not exclude a stricture. No obvious mass on the recent CT
scan.
IMPRESSION: 1. Stable cardiac enlargement.  No acute pulmonary findings.
2. Persistent stool and contrast distended right and proximal
transverse colon with otherwise decompressed distal colon. Could not
exclude a stricture. No obvious mass on the recent CT scan.

## 2017-04-25 IMAGING — CR DG CHEST 2V
1 series · 2 of 2 positions shown · non-contrast
Comparison: 05/28/2015

CLINICAL DATA: Heart racing for 3 hours, LEFT side chest pain,
shortness of breath, recently discharged for a syncopal episode 2
days ago, history atrial fibrillation, type I diabetes mellitus,
GERD, hypertension, pancreatitis, atrial fibrillation

EXAM:
CHEST  2 VIEW

[Series 1: dg chest 2 view · 0.14mm/px · 2 of 2 slices shown]
[im 1/2]
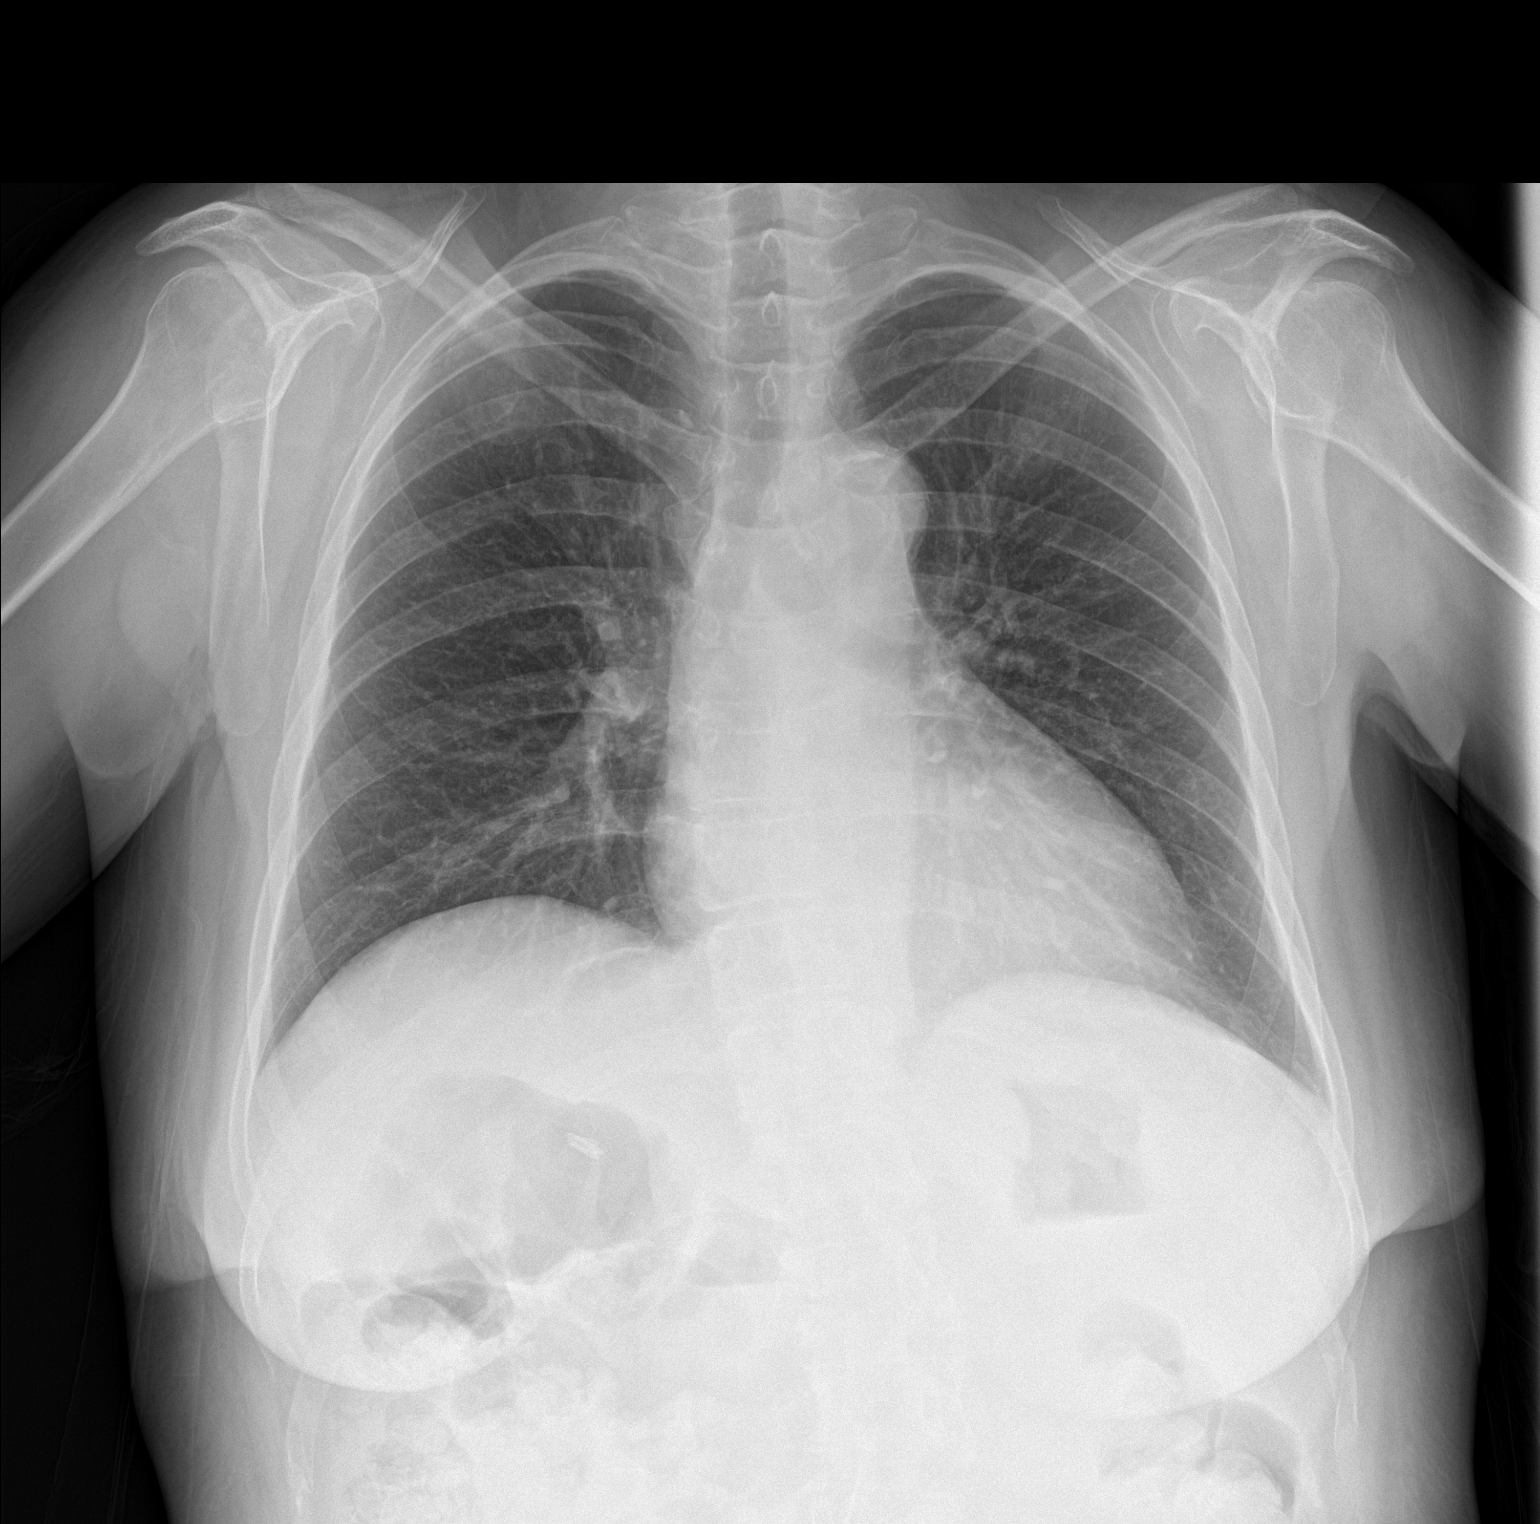
[im 2/2]
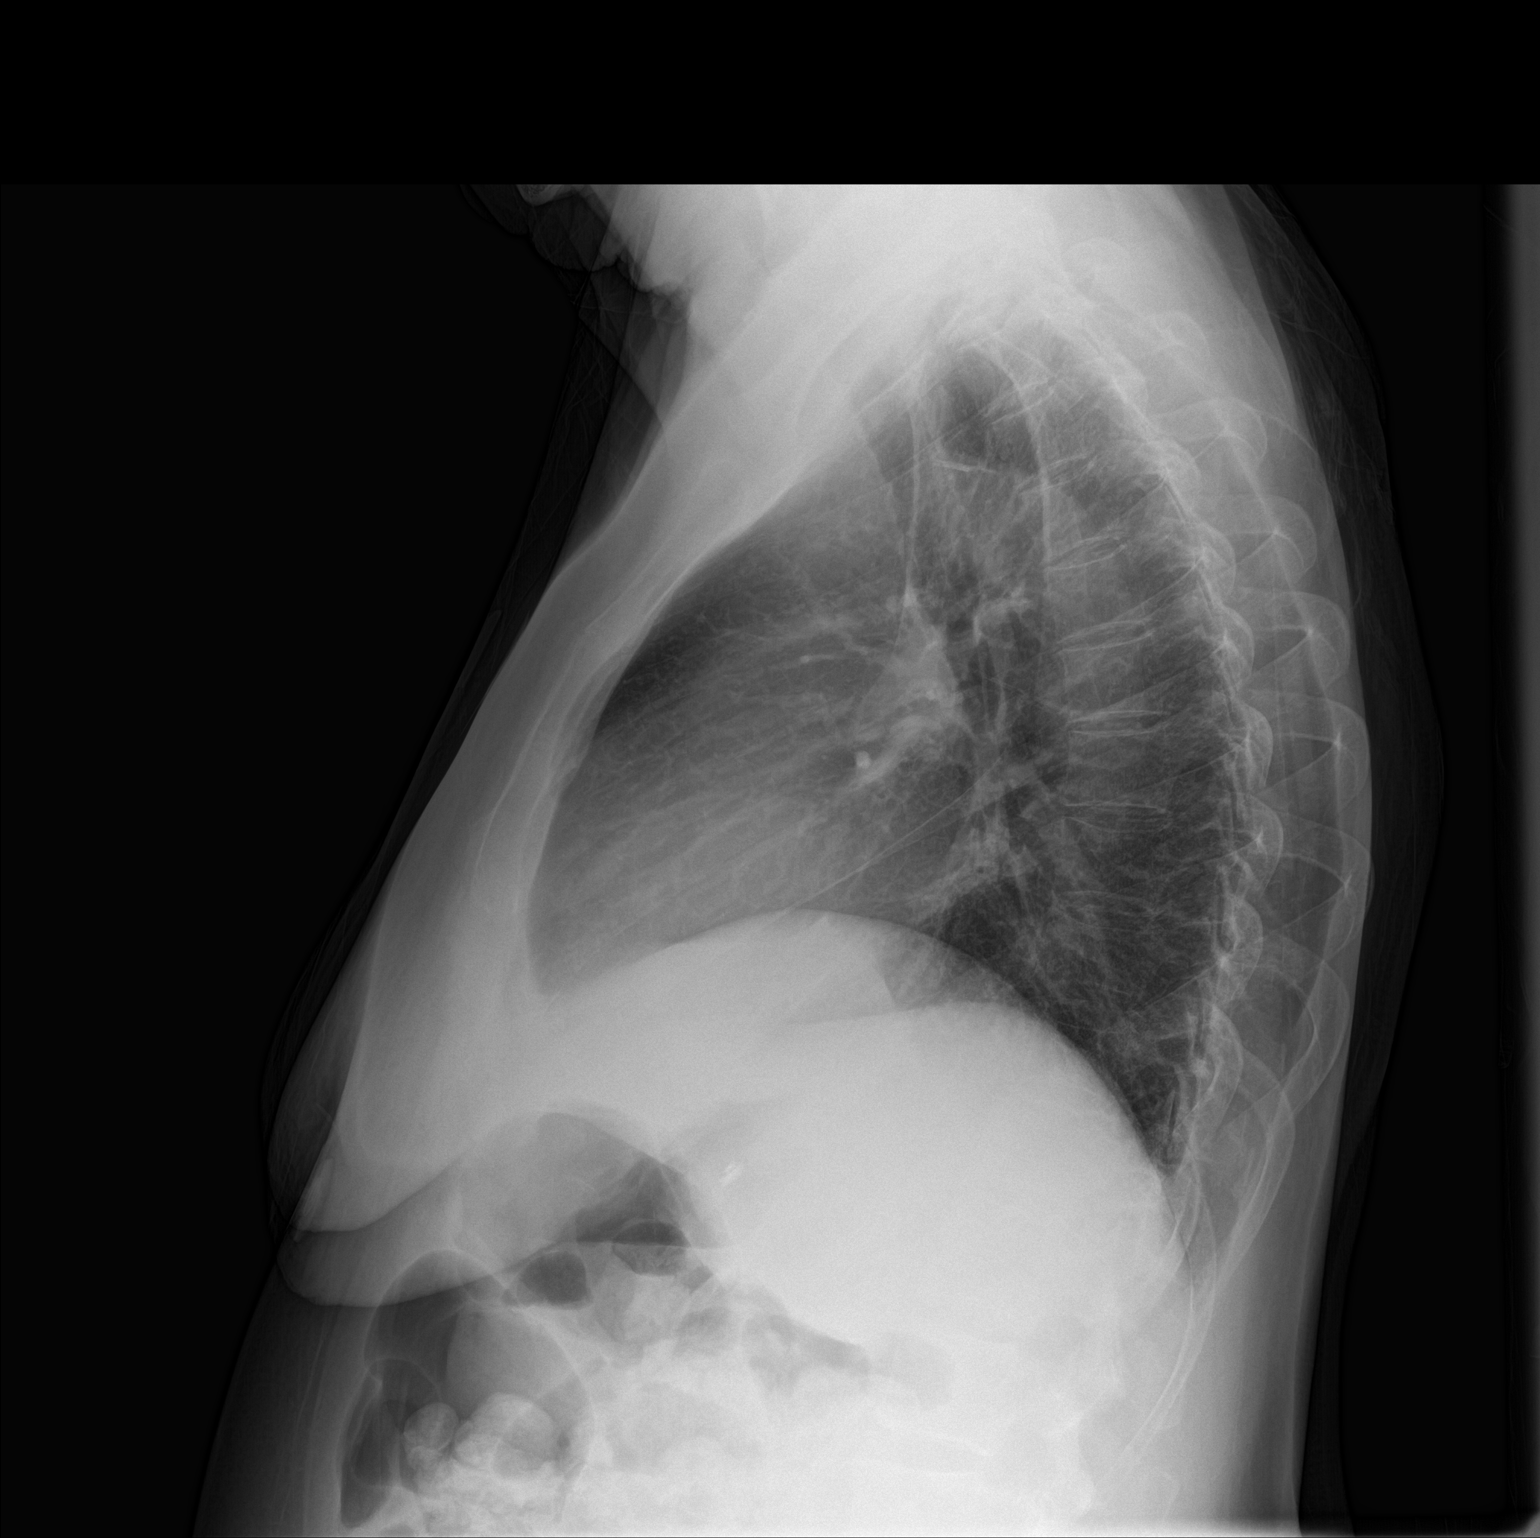

[2 of 2 positions shown; findings below may reference images not displayed]

FINDINGS: Upper normal heart size.

Normal mediastinal contours and pulmonary vascularity.

Atherosclerotic calcification aorta.

Lungs clear.

No pleural effusion or pneumothorax.

Bones demineralized.
IMPRESSION: No acute abnormalities.

## 2017-04-29 DIAGNOSIS — G894 Chronic pain syndrome: Secondary | ICD-10-CM | POA: Diagnosis not present

## 2017-04-29 DIAGNOSIS — M5412 Radiculopathy, cervical region: Secondary | ICD-10-CM | POA: Diagnosis not present

## 2017-04-29 DIAGNOSIS — M5416 Radiculopathy, lumbar region: Secondary | ICD-10-CM | POA: Diagnosis not present

## 2017-04-29 DIAGNOSIS — F1120 Opioid dependence, uncomplicated: Secondary | ICD-10-CM | POA: Diagnosis not present

## 2017-04-29 DIAGNOSIS — Z79899 Other long term (current) drug therapy: Secondary | ICD-10-CM | POA: Diagnosis not present

## 2017-04-29 DIAGNOSIS — M542 Cervicalgia: Secondary | ICD-10-CM | POA: Diagnosis not present

## 2017-04-29 DIAGNOSIS — M545 Low back pain: Secondary | ICD-10-CM | POA: Diagnosis not present

## 2017-04-29 DIAGNOSIS — G8929 Other chronic pain: Secondary | ICD-10-CM | POA: Diagnosis not present

## 2017-04-29 DIAGNOSIS — I10 Essential (primary) hypertension: Secondary | ICD-10-CM | POA: Diagnosis not present

## 2017-05-19 IMAGING — US US CAROTID DUPLEX BILAT
1 series · 13 of 24 positions shown · non-contrast
Comparison: 07/27/2009

CLINICAL DATA: Stroke, visual disturbance.  Diabetes.

EXAM:
BILATERAL CAROTID DUPLEX ULTRASOUND
TECHNIQUE: Gray scale imaging, color Doppler and duplex ultrasound was
performed of bilateral carotid and vertebral arteries in the neck.

[Series 1: us carotid duplex bilat · 0.06mm/px · 13 of 57 slices shown]
[im 1/57]
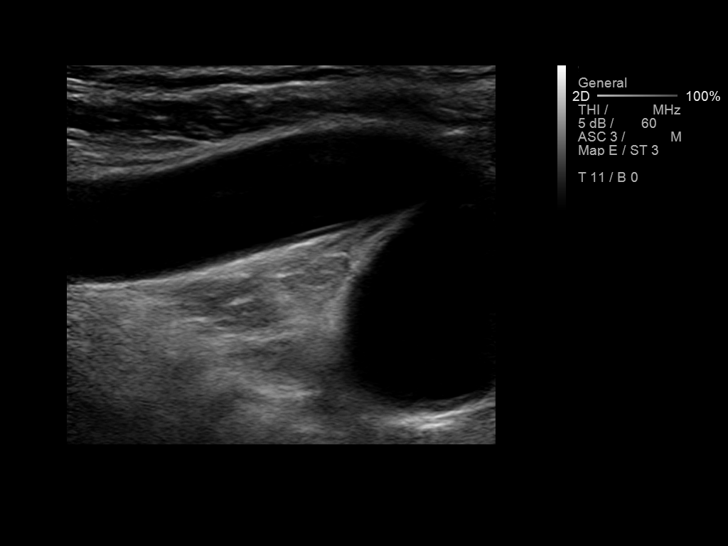
[im 5/57]
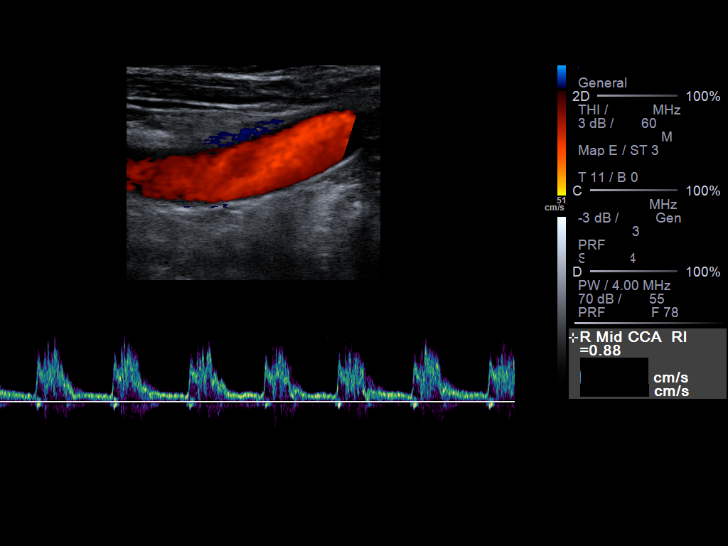
[im 10/57]
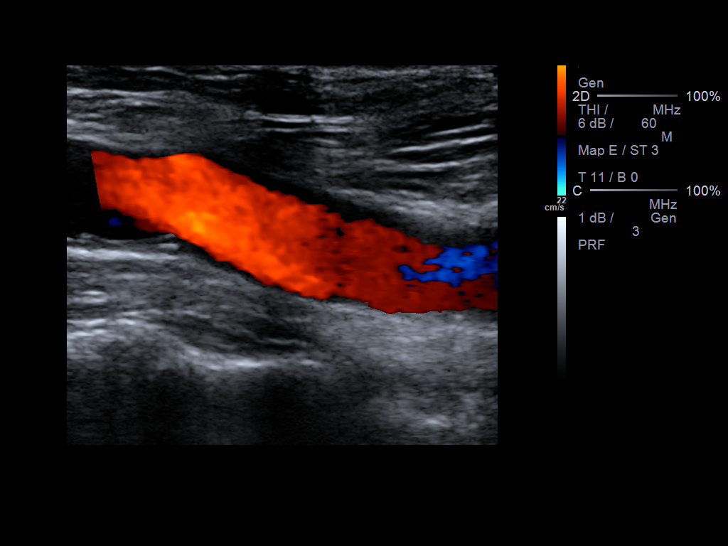
[im 15/57]
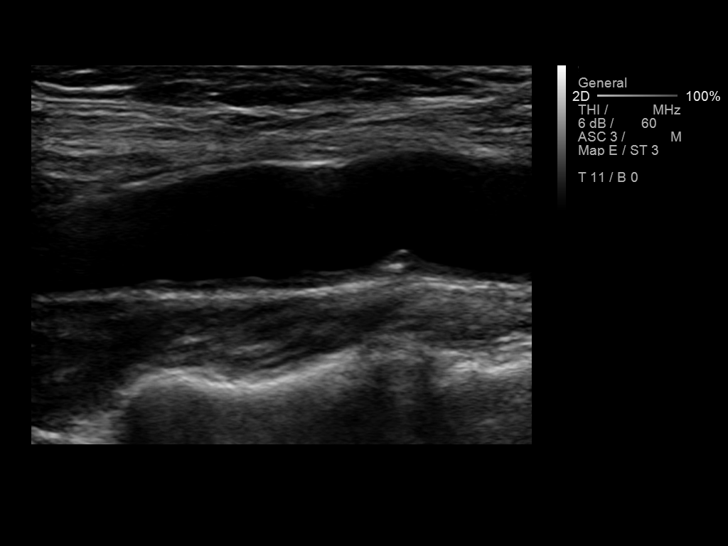
[im 20/57]
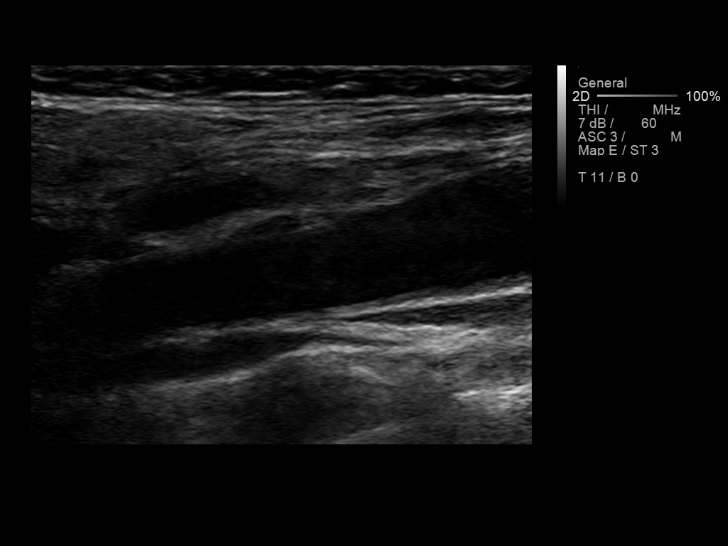
[im 25/57]
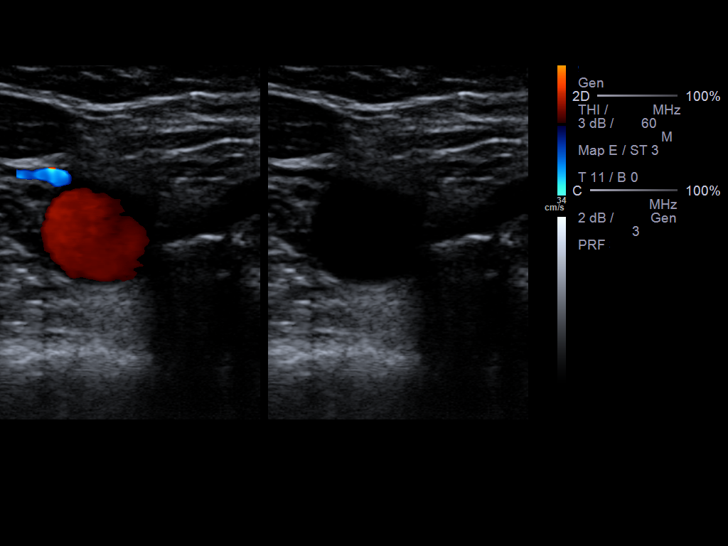
[im 30/57]
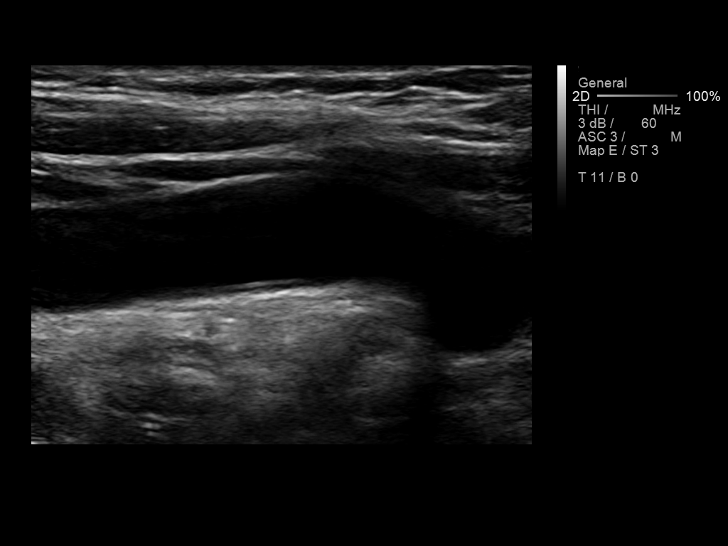
[im 32/57]
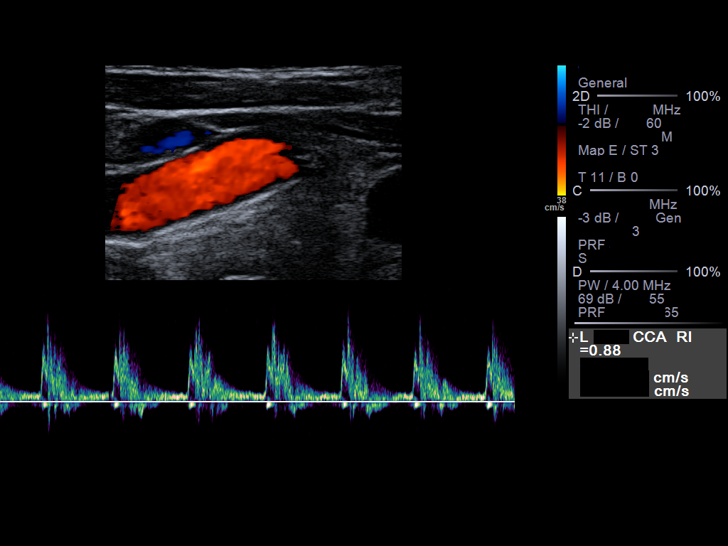
[im 37/57]
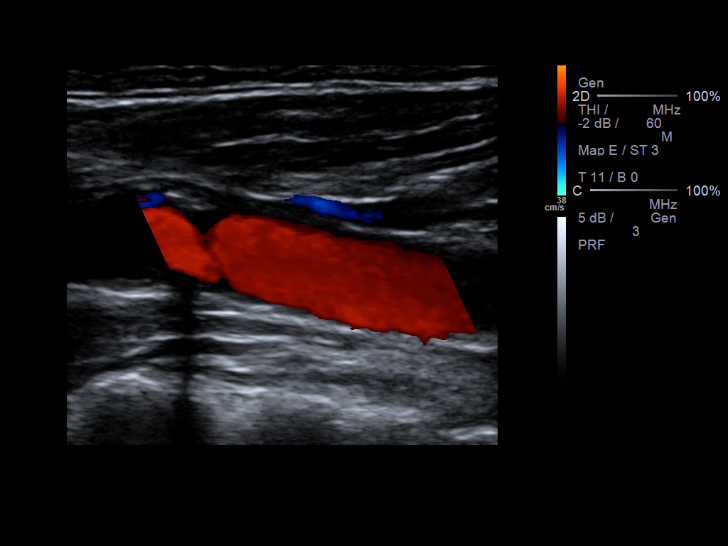
[im 42/57]
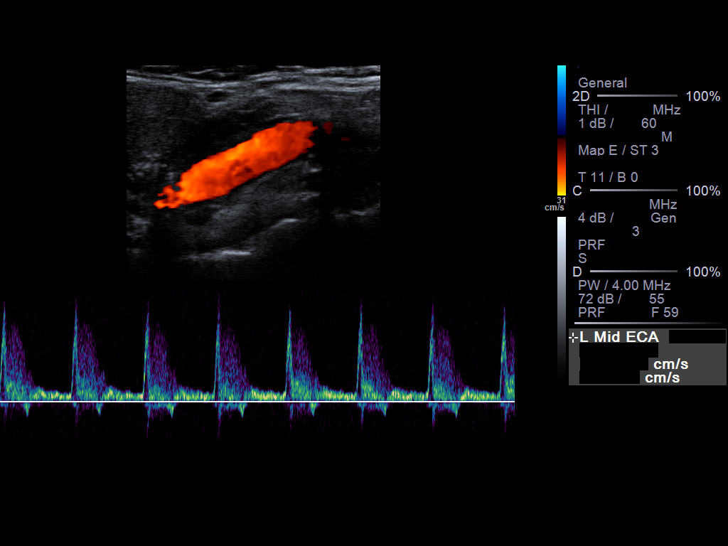
[im 47/57]
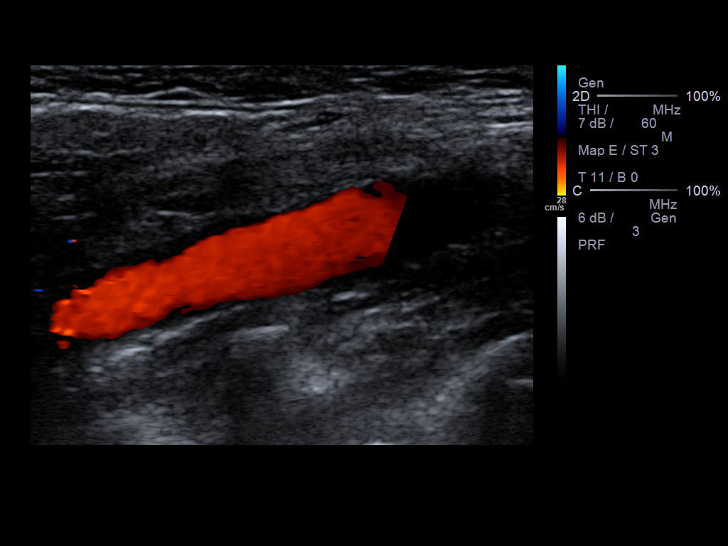
[im 52/57]
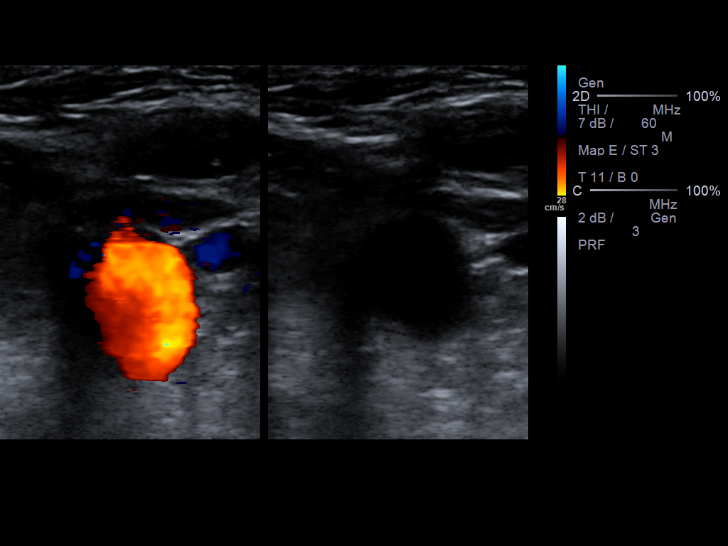
[im 57/57]
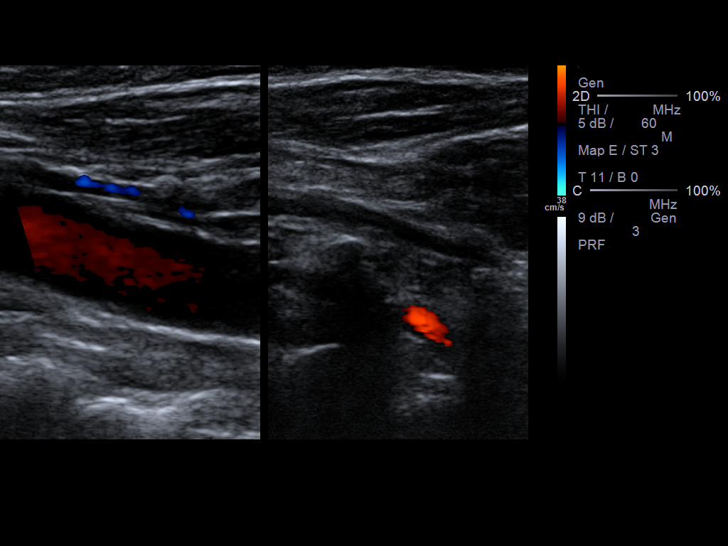

[13 of 24 positions shown; findings below may reference images not displayed]

REVIEW OF SYSTEMS:
Quantification of carotid stenosis is based on velocity parameters
that correlate the residual internal carotid diameter with
NASCET-based stenosis levels, using the diameter of the distal
internal carotid lumen as the denominator for stenosis measurement.

The following velocity measurements were obtained:

PEAK SYSTOLIC/END DIASTOLIC

RIGHT

ICA:                     58/14cm/sec

CCA:                     47/11cm/sec

SYSTOLIC ICA/CCA RATIO:

DIASTOLIC ICA/CCA RATIO:

ECA:                     65cm/sec

LEFT

ICA:                     56/13cm/sec

CCA:                     55/12cm/sec

SYSTOLIC ICA/CCA RATIO:

DIASTOLIC ICA/CCA RATIO:

ECA:                     100cm/sec
FINDINGS: RIGHT CAROTID ARTERY: Mild noncalcified plaque in the proximal ICA.
No significant stenosis. Normal waveforms and color Doppler signal.

RIGHT VERTEBRAL ARTERY:  Normal flow direction and waveform.

LEFT CAROTID ARTERY: Eccentric partially calcified plaque in the
carotid bulb and proximal ICA resulting in mild stenosis. Normal
waveforms and color Doppler signal. No focal aliasing.

LEFT VERTEBRAL ARTERY: Normal flow direction and waveform.
IMPRESSION: 1. Mild bilateral proximal ICA plaque resulting in less than 50%
diameter stenosis. The exam does not exclude plaque ulceration or
embolization. Continued surveillance recommended.

## 2017-05-26 IMAGING — CT CT ABD-PELV W/ CM
1 of 3 series · 13 of 32 positions shown, 17 images · IV contrast (omnipaque)
Comparison: CT the abdomen and pelvis 05/17/2015.

ADDENDUM:
The original report was by Dr. Es Tk [place]. The following addendum
is by Dr. Bl[place]a [place]:

I received a call requesting comparison to the 04/03/2015 exam with
regard to the masslike thickening in the gastroesophageal junction
and stomach cardia.
The appearance in this area on 07/03/2015 is very similar to the
04/03/2015 exam.
By report, the patient had an endoscopy on 04/04/2015 with careful
assessment of the stomach cardia and gastroesophageal junction. At
the endoscopy, fundoplication was revealed along with gastritis but
there was no mass.
Given the similarity of the appearance on 07/03/2015 and 04/03/2015,
this region has thus already been assessed by endoscopy.
Accordingly, further endoscopy is not felt to be mandatory and in
fact may be duplicative.
CLINICAL DATA: 59-year-old female with epigastric pain for the past
4 days. Nausea and vomiting. History of uterine cancer.
EXAM:
CT ABDOMEN AND PELVIS WITH CONTRAST
TECHNIQUE: Multidetector CT imaging of the abdomen and pelvis was performed
using the standard protocol following bolus administration of
intravenous contrast.
CONTRAST:  100mL OMNIPAQUE IOHEXOL 300 MG/ML  SOLN

[Series 2: routine abd pel with · axial · 0.68mm/px · z∈[-476,-51]mm · 13 of 96 slices shown, 17 images]
[im 6/96  soft-tissue]
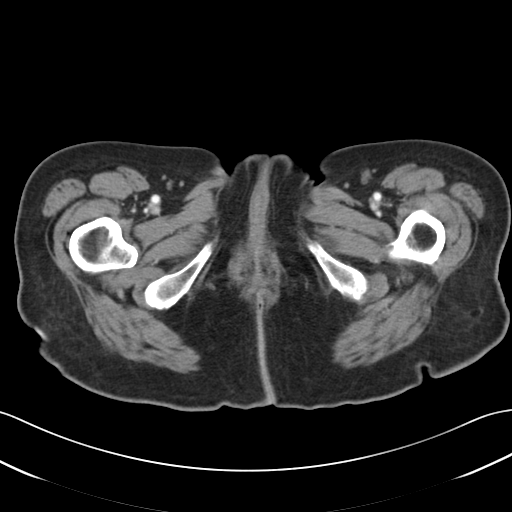
[im 6/96  bone]
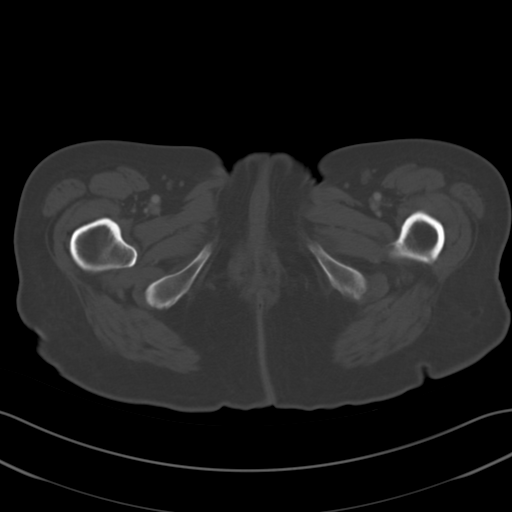
[im 16/96  soft-tissue]
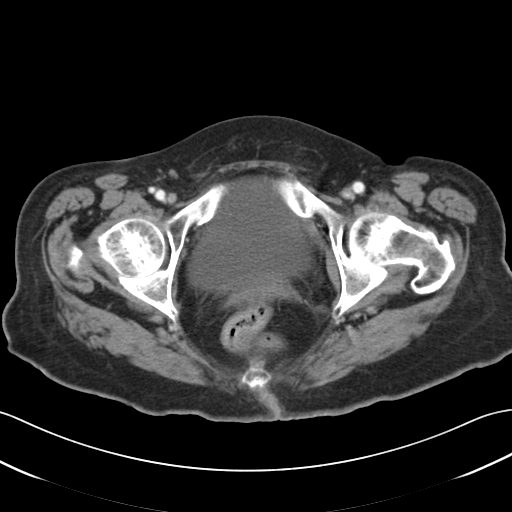
[im 26/96  soft-tissue]
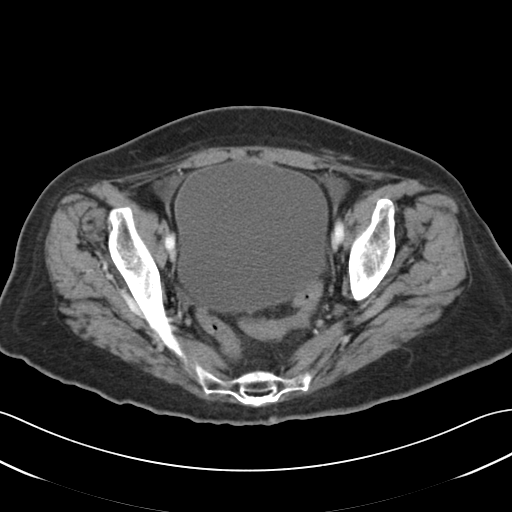
[im 31/96  soft-tissue]
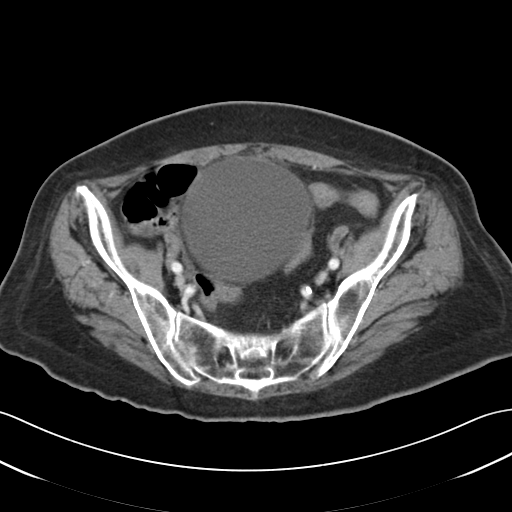
[im 41/96  soft-tissue]
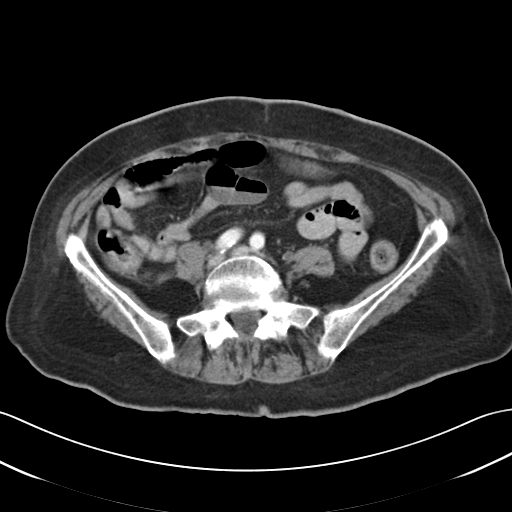
[im 51/96  soft-tissue]
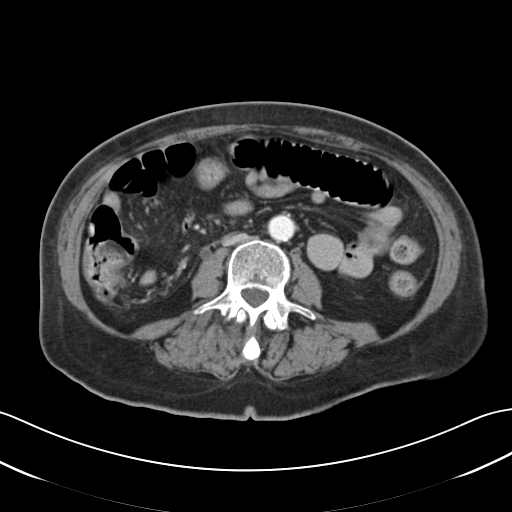
[im 56/96  soft-tissue]
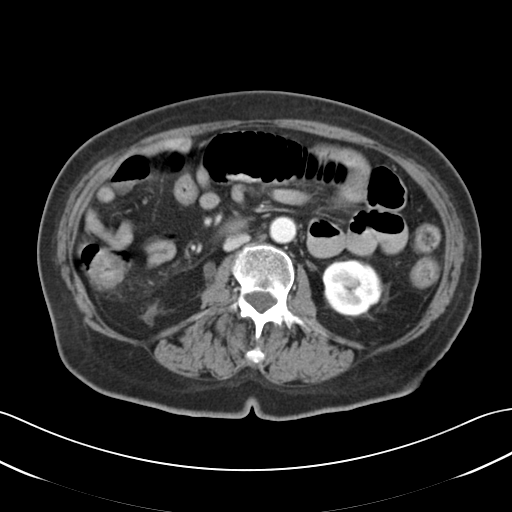
[im 66/96  soft-tissue]
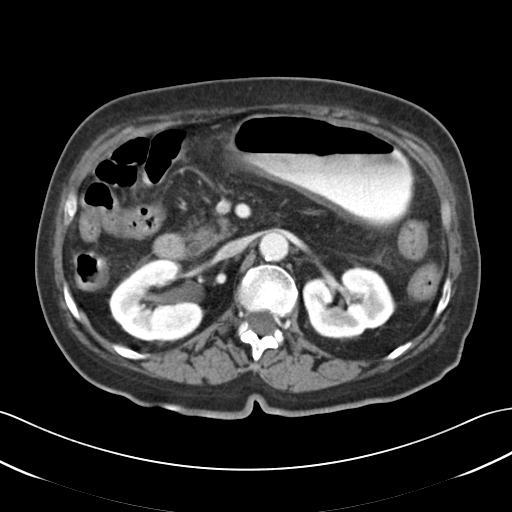
[im 71/96  soft-tissue]
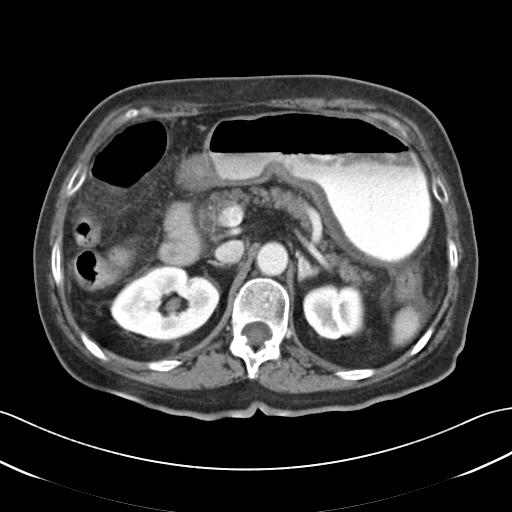
[im 71/96  bone]
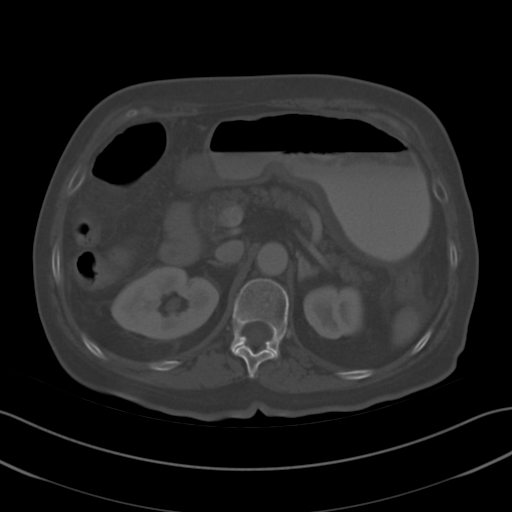
[im 76/96  lung]
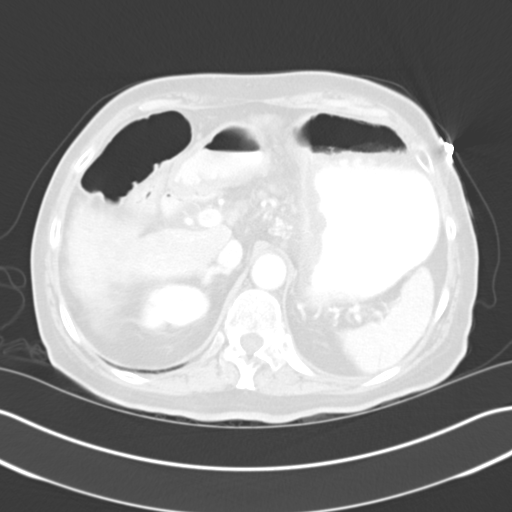
[im 81/96  soft-tissue]
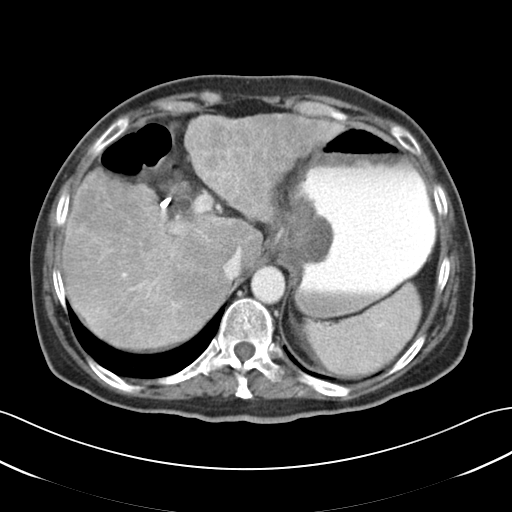
[im 81/96  lung]
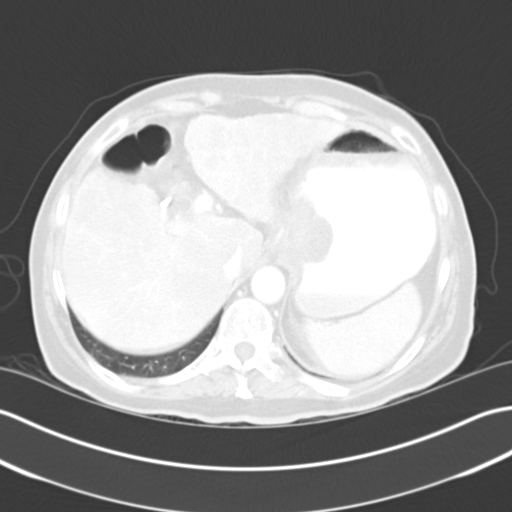
[im 86/96  lung]
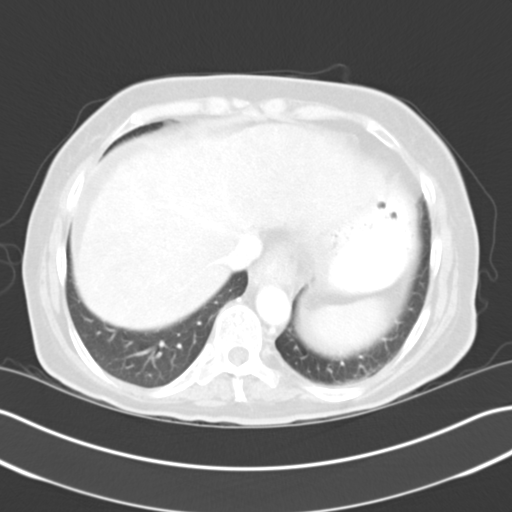
[im 91/96  soft-tissue]
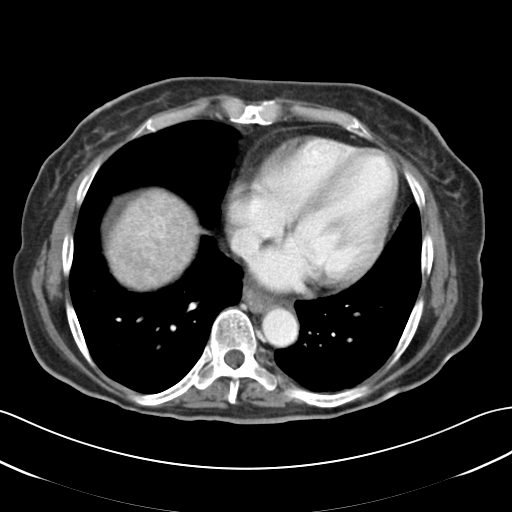
[im 91/96  lung]
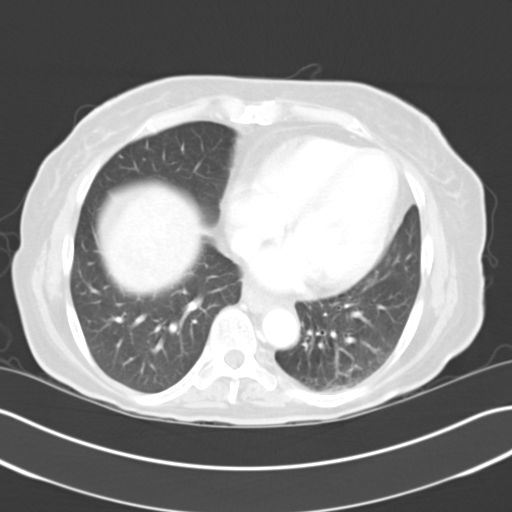

[13 of 32 positions shown; findings below may reference images not displayed]

FINDINGS: Lower chest:  Unremarkable.

Hepatobiliary: The liver has a shrunken appearance and nodular
contour, compatible with underlying cirrhosis. Multiple nodular
appearing hypovascular areas on the initial postcontrast images
which were obtained slightly early, favored to represent a
background of widespread regenerative nodules. No discrete
hypervascular hepatic lesion is confidently identified. This unusual
pattern in the liver seems to normalize on the more delayed images
in the visualized portions of the liver. No intra or extrahepatic
biliary ductal dilatation. Status post cholecystectomy.

Pancreas: No pancreatic mass. No pancreatic ductal dilatation. No
pancreatic or peripancreatic fluid or inflammatory changes.

Spleen: Unremarkable.

Adrenals/Urinary Tract: Bilateral adrenal glands the left kidney are
normal in appearance. Sub cm low-attenuation lesions in the right
kidney are too small to definitively characterize, but are
statistically likely to represent tiny cysts. 9 mm nonobstructive
calculus in the lower pole collecting system of the right kidney.
Mild right hydroureter. No calculi are identified along the course
of either ureter or within the lumen of the urinary bladder. Urinary
bladder is moderately distended. Bilateral adrenal glands are normal
in appearance.

Stomach/Bowel: Profound thickening of the gastric wall immediately
distal to the gastroesophageal junction, somewhat mass-like in
appearance, best appreciated on image 17 of series 2 where this
measures up to 7.1 x 3.6 cm. No pathologic dilatation of small bowel
or colon.

Vascular/Lymphatic: Atherosclerotic calcifications throughout the
abdominal and pelvic vasculature, without evidence of aneurysm or
dissection. No lymphadenopathy noted in the abdomen or pelvis.

Reproductive: Status post hysterectomy. Ovaries are not confidently
identified may be surgically absent or atrophic.

Other: Small volume of ascites.  No pneumoperitoneum.

Musculoskeletal: There are no aggressive appearing lytic or blastic
lesions noted in the visualized portions of the skeleton.
IMPRESSION: 1. Mass-like thickening of the proximal stomach highly concerning
for neoplasm. Correlation with nonemergent endoscopy is recommended
in the near future to exclude neoplasm.
2. Morphologic changes in the liver most compatible with underlying
cirrhosis, as discussed above. The possibility of metastatic disease
to the liver is not excluded, but is not strongly favored on the
basis of today's examination. This could be more definitively
evaluated with followup nonemergent MRI of the abdomen with and
without IV gadolinium if clinically appropriate.
3. 9 mm nonobstructive calculus in the lower pole collecting system
of the right kidney.
4. Mild right hydroureter, without frank hydronephrosis. This is of
uncertain etiology and significance, but could indicate mild distal
ureteral stricture related to remote passage of a stone.
5. Atherosclerosis.

## 2017-05-27 DIAGNOSIS — G894 Chronic pain syndrome: Secondary | ICD-10-CM | POA: Diagnosis not present

## 2017-05-27 DIAGNOSIS — I10 Essential (primary) hypertension: Secondary | ICD-10-CM | POA: Diagnosis not present

## 2017-05-27 DIAGNOSIS — G8929 Other chronic pain: Secondary | ICD-10-CM | POA: Diagnosis not present

## 2017-05-27 DIAGNOSIS — F1120 Opioid dependence, uncomplicated: Secondary | ICD-10-CM | POA: Diagnosis not present

## 2017-05-27 DIAGNOSIS — M545 Low back pain: Secondary | ICD-10-CM | POA: Diagnosis not present

## 2017-05-27 DIAGNOSIS — M5412 Radiculopathy, cervical region: Secondary | ICD-10-CM | POA: Diagnosis not present

## 2017-05-27 DIAGNOSIS — M542 Cervicalgia: Secondary | ICD-10-CM | POA: Diagnosis not present

## 2017-05-27 DIAGNOSIS — M5416 Radiculopathy, lumbar region: Secondary | ICD-10-CM | POA: Diagnosis not present

## 2017-05-27 DIAGNOSIS — Z79899 Other long term (current) drug therapy: Secondary | ICD-10-CM | POA: Diagnosis not present

## 2017-05-27 IMAGING — CR DG ELBOW 2V*R*
1 series · 2 of 2 positions shown · non-contrast
Comparison: None.

CLINICAL DATA: Elbow pain, no known injury

EXAM:
RIGHT ELBOW - 2 VIEW

[Series 1: ap · 0.17mm/px · 2 of 2 slices shown]
[im 1/2]
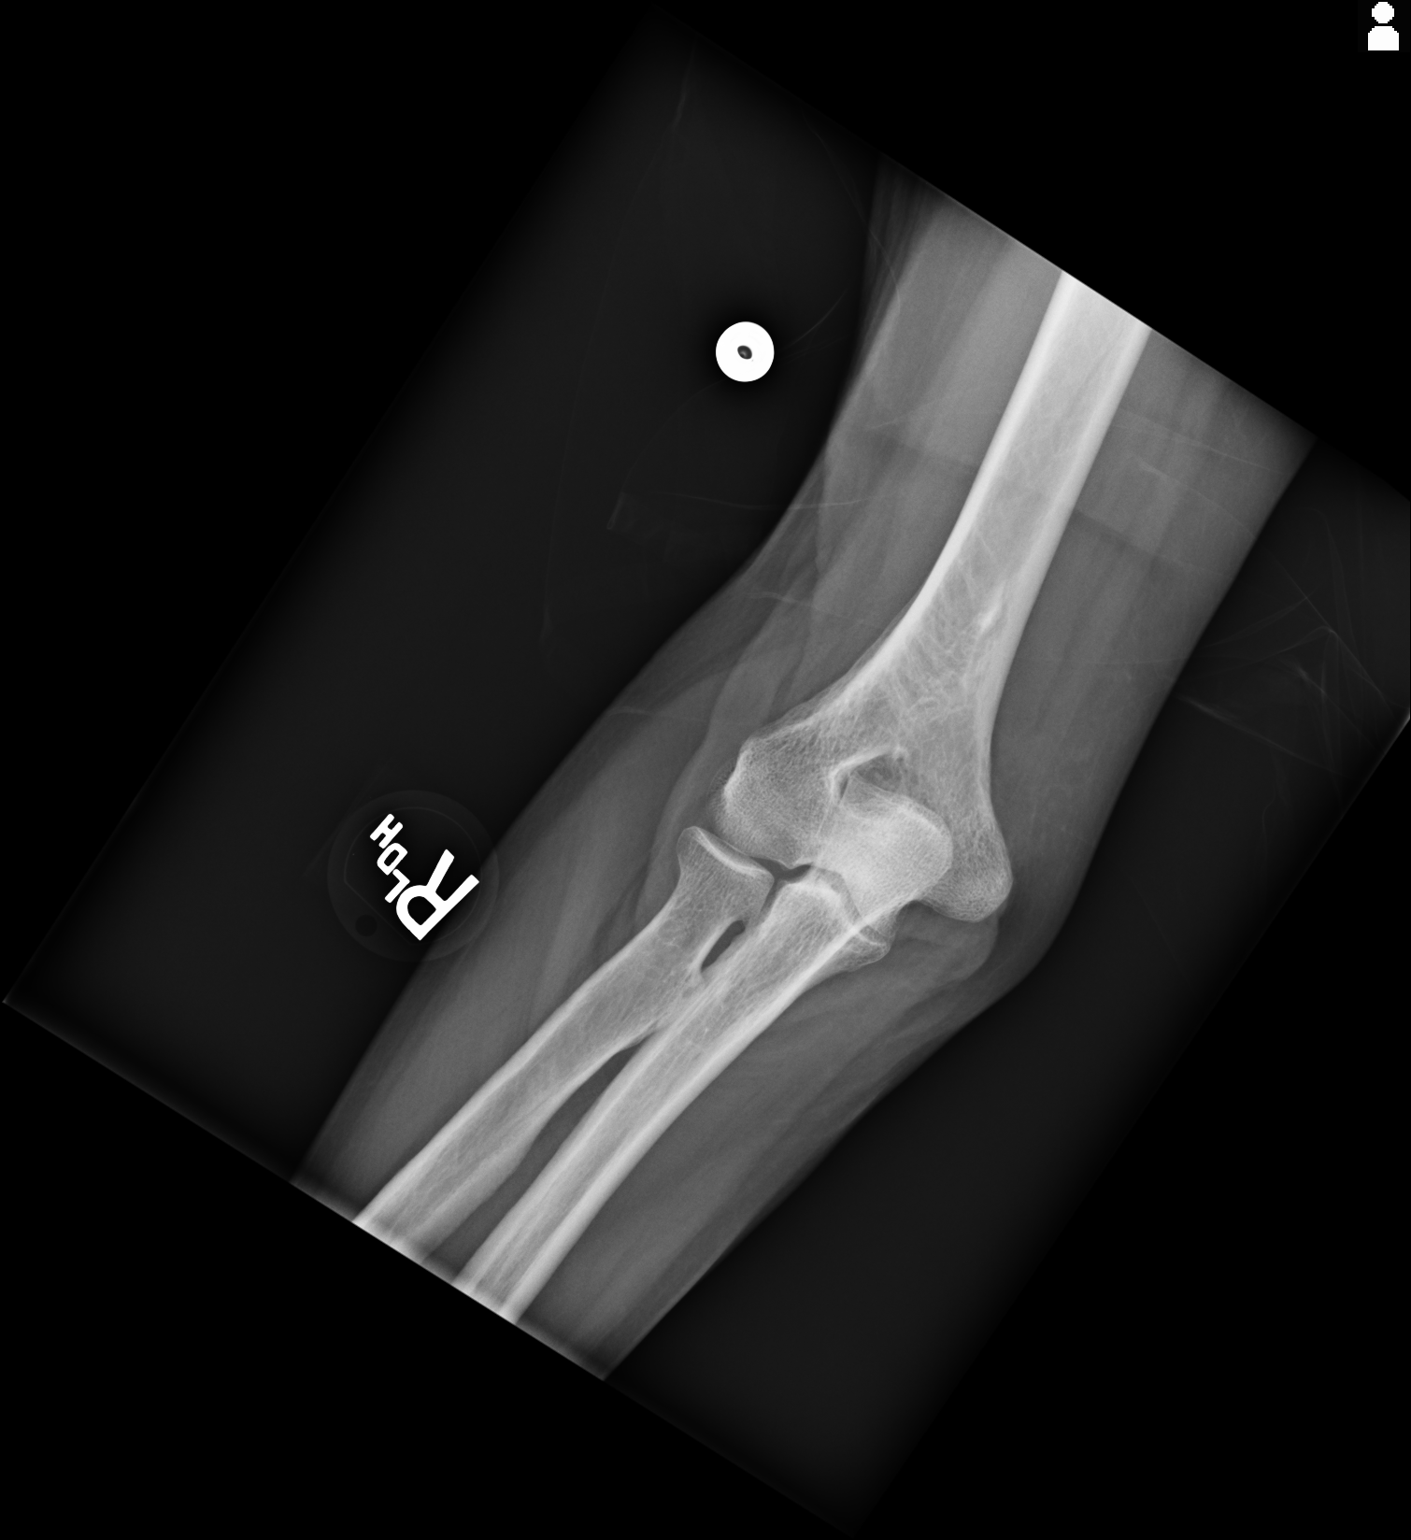
[im 2/2]
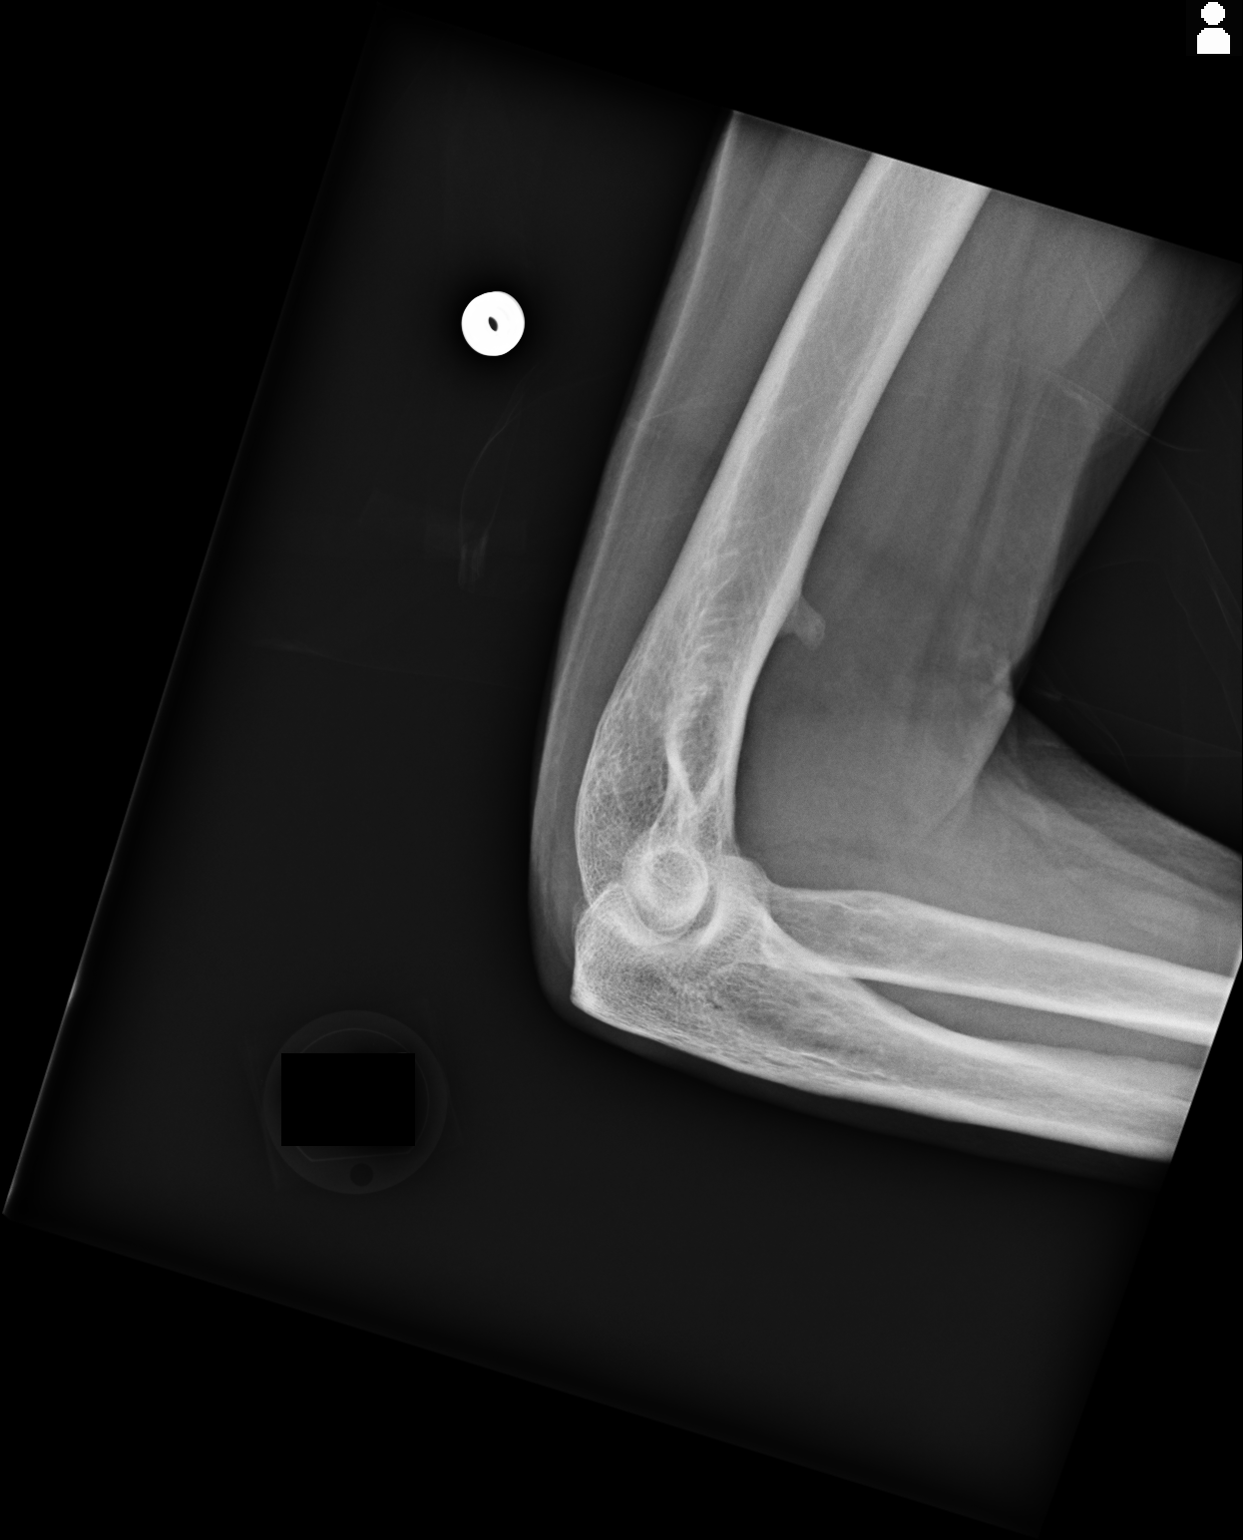

[2 of 2 positions shown; findings below may reference images not displayed]

FINDINGS: Two views of the right elbow submitted. No acute fracture or
subluxation. No posterior fat pad sign. Exostosis or osteophyte is
noted in distal right humerus.
IMPRESSION: Negative.

## 2017-05-29 IMAGING — CT CT HEAD W/O CM
1 of 2 series · 14 of 30 positions shown, 18 images · non-contrast
Comparison: CT scan of May 27, 2015.

CLINICAL DATA: Altered mental status.

EXAM:
CT HEAD WITHOUT CONTRAST
TECHNIQUE: Contiguous axial images were obtained from the base of the skull
through the vertex without intravenous contrast.

[Series 5: soft tissue · axial · 0.38mm/px · z∈[-148,-20]mm · 14 of 30 slices shown, 18 images]
[im 2/30  brain]
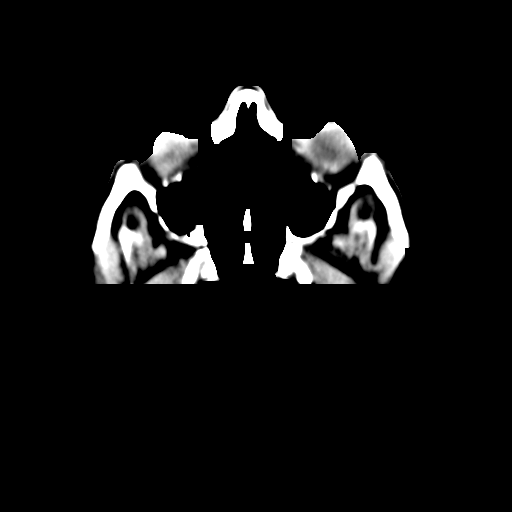
[im 2/30  bone]
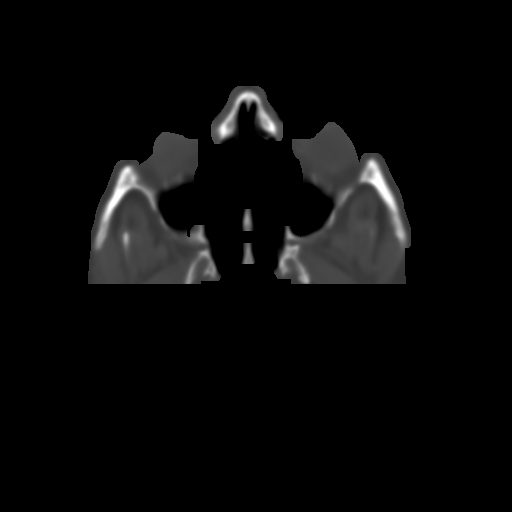
[im 4/30  brain]
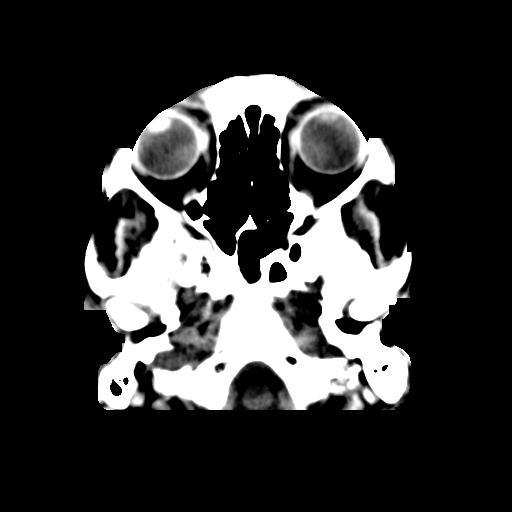
[im 6/30  brain]
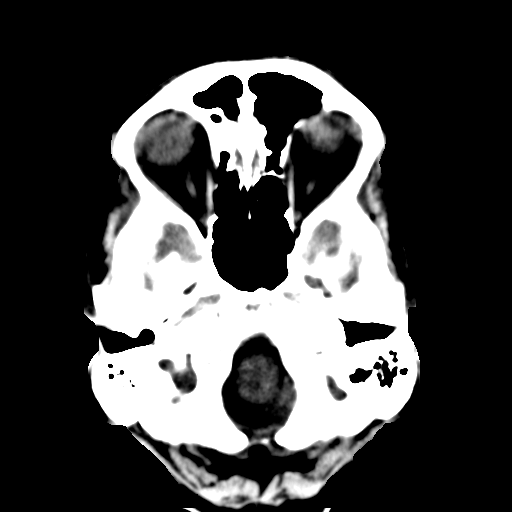
[im 8/30  brain]
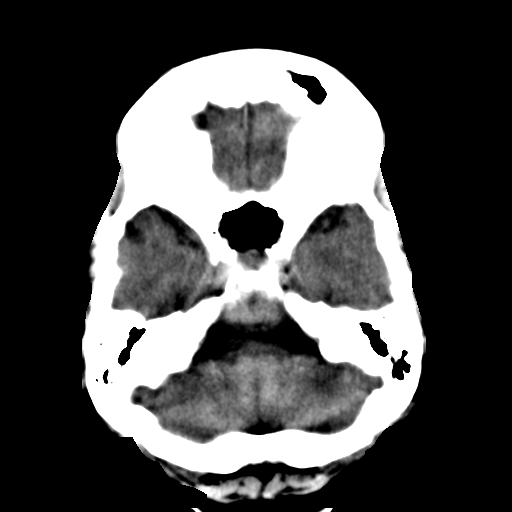
[im 10/30  brain]
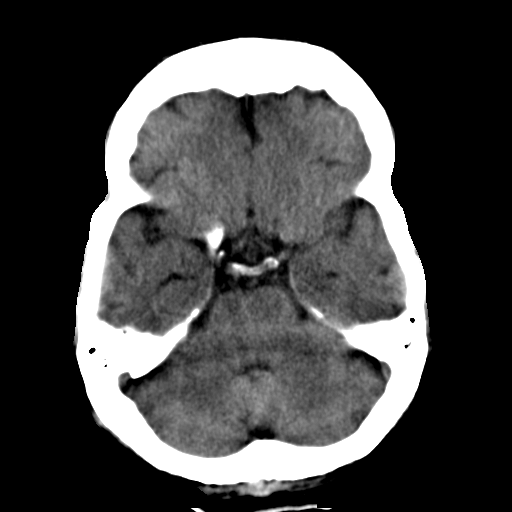
[im 10/30  bone]
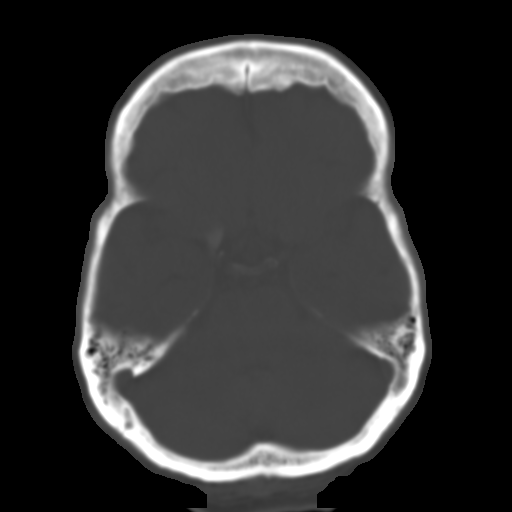
[im 12/30  brain]
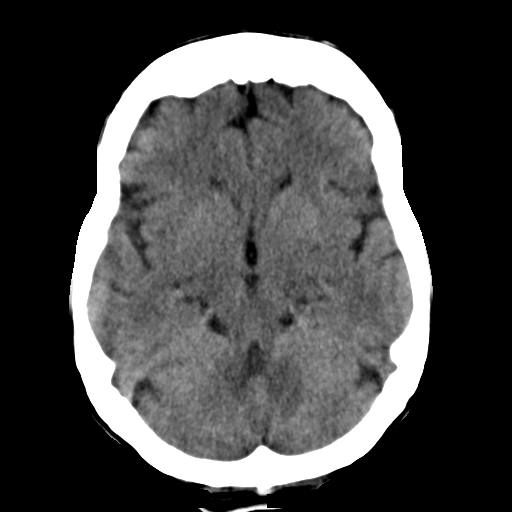
[im 14/30  brain]
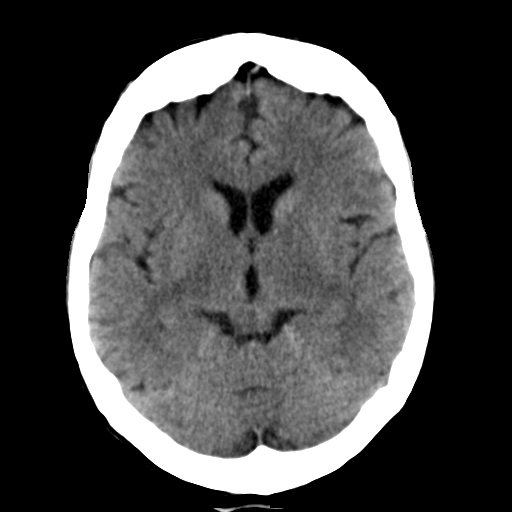
[im 16/30  brain]
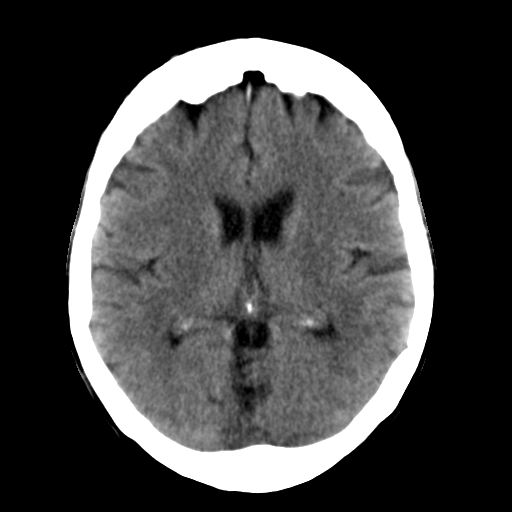
[im 18/30  brain]
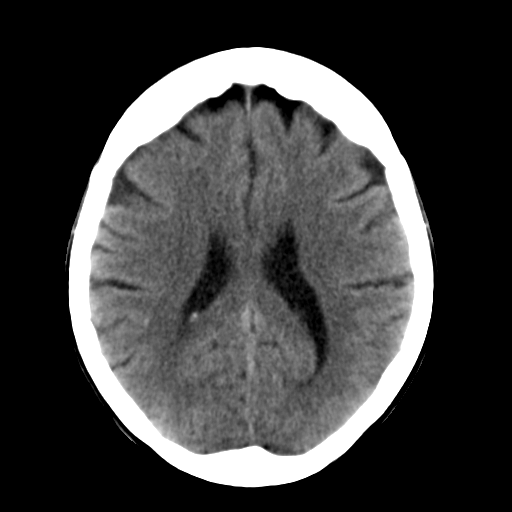
[im 18/30  bone]
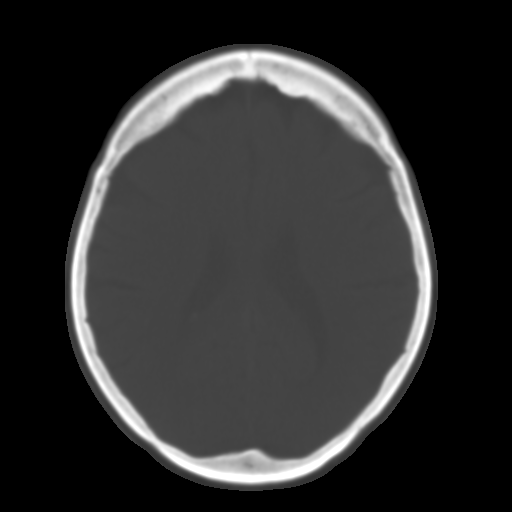
[im 20/30  brain]
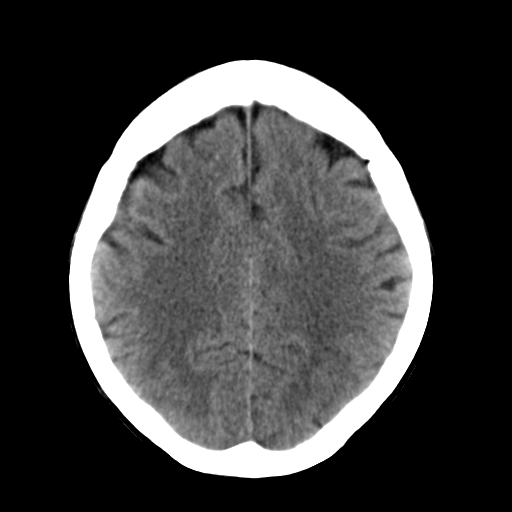
[im 22/30  brain]
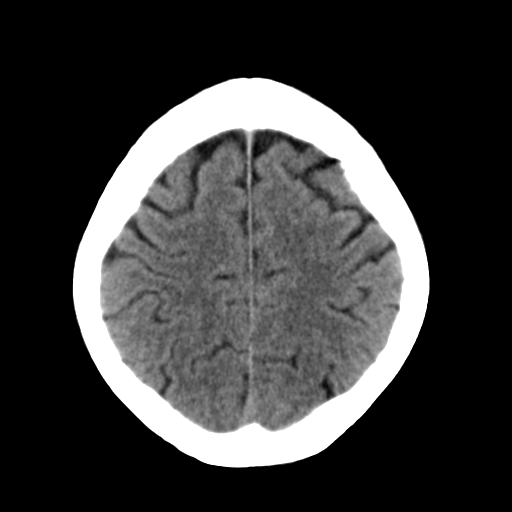
[im 24/30  brain]
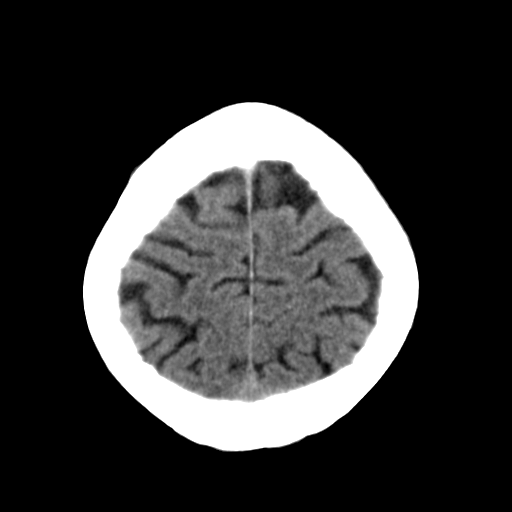
[im 26/30  brain]
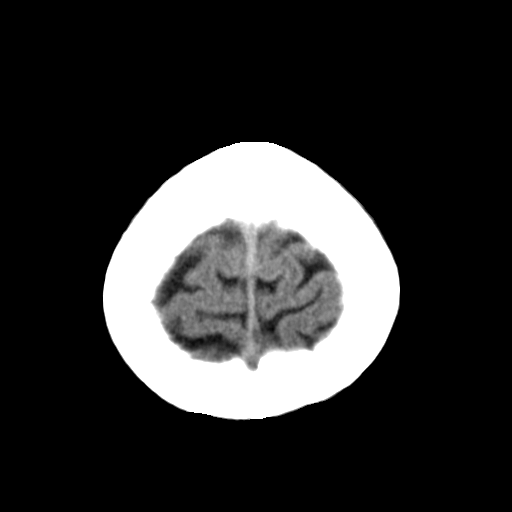
[im 26/30  bone]
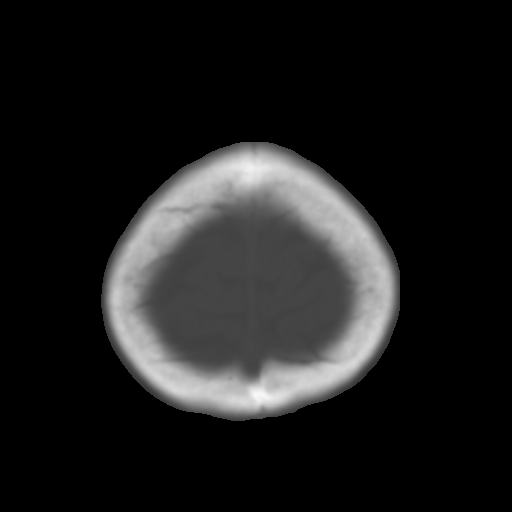
[im 28/30  brain]
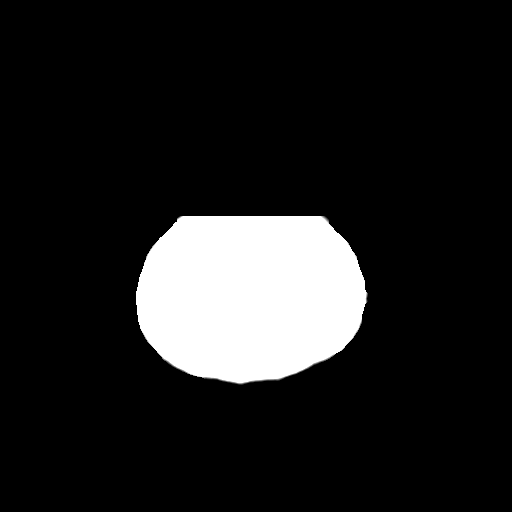

[14 of 30 positions shown; findings below may reference images not displayed]

FINDINGS: Bony calvarium appears intact. Mild chronic ischemic white matter
disease is noted. No mass effect or midline shift is noted.
Ventricular size is within normal limits. There is no evidence of
mass lesion, hemorrhage or acute infarction.
IMPRESSION: Mild chronic ischemic white matter disease. No acute intracranial
abnormality seen.

## 2017-05-29 IMAGING — CR DG ABDOMEN 1V
1 series · 2 of 2 positions shown · non-contrast
Comparison: CT abdomen and pelvis July 03, 2015

CLINICAL DATA: Abdominal pain with blood in stool

EXAM:
ABDOMEN - 1 VIEW

[Series 1: supine ap · 0.17mm/px · 2 of 2 slices shown]
[im 1/2]
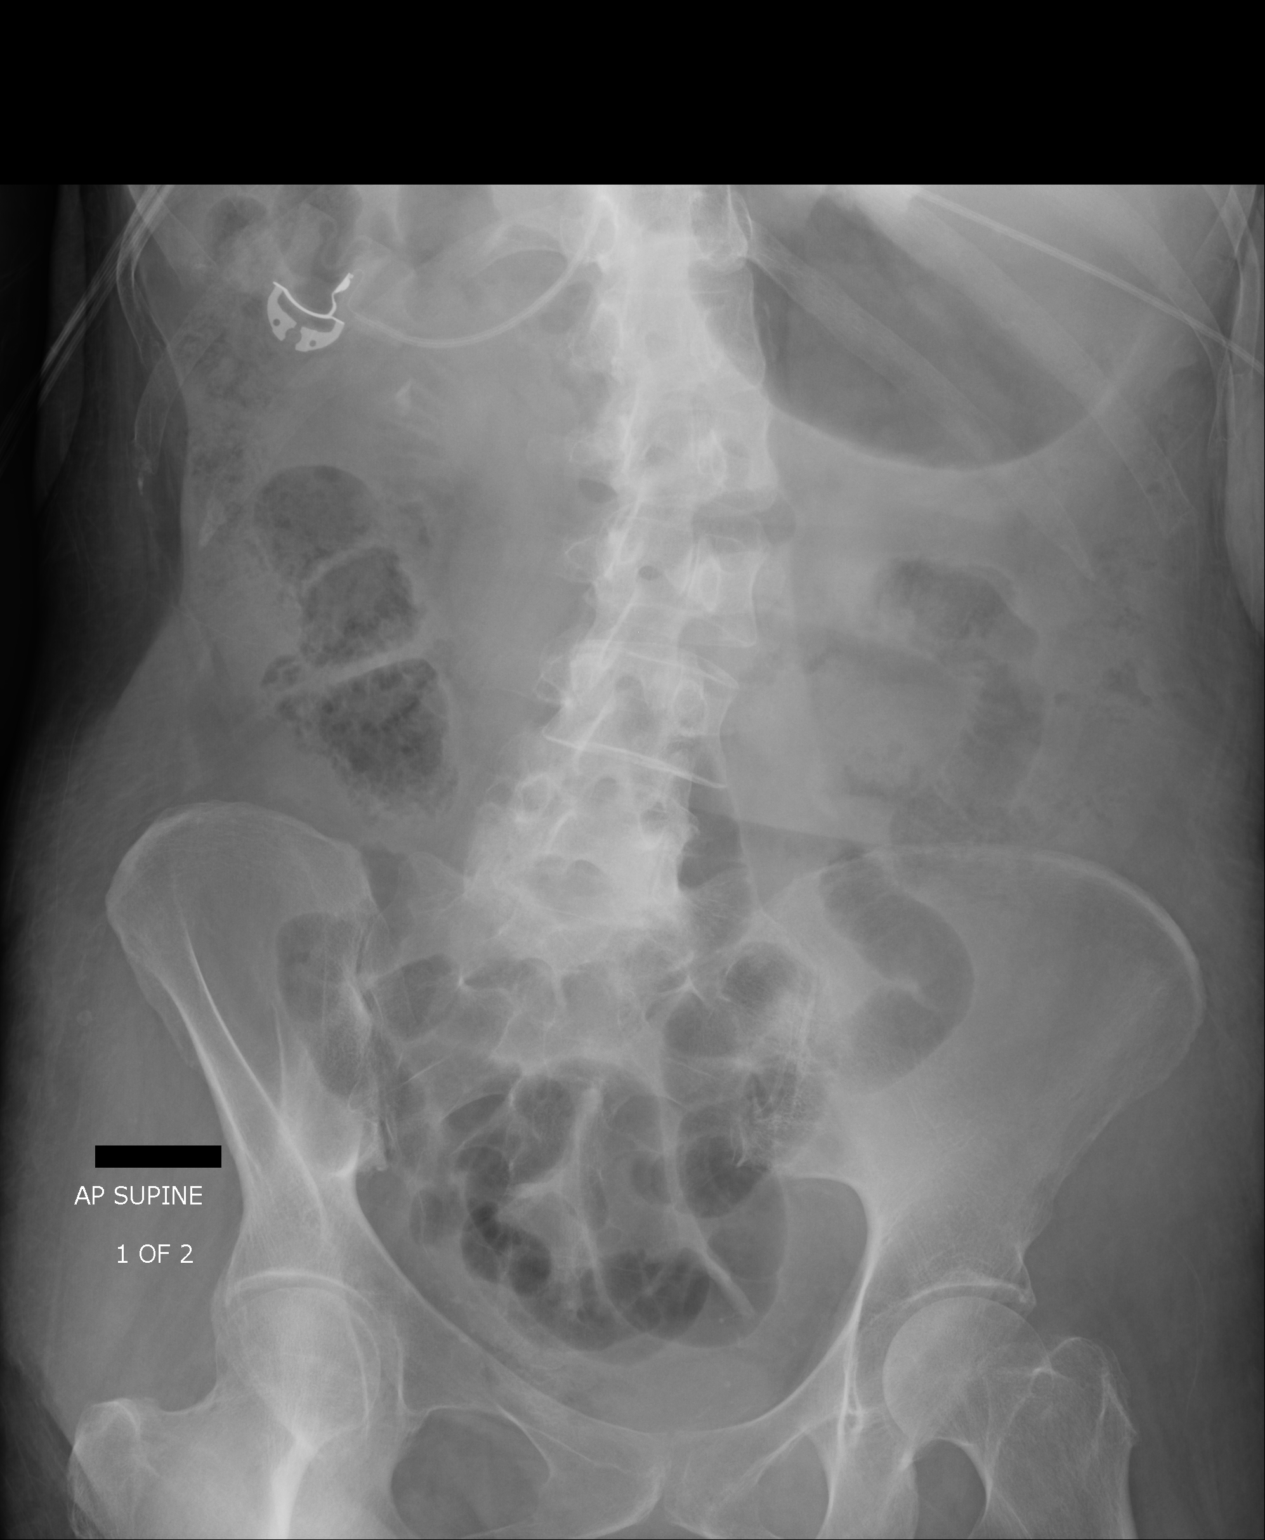
[im 2/2]
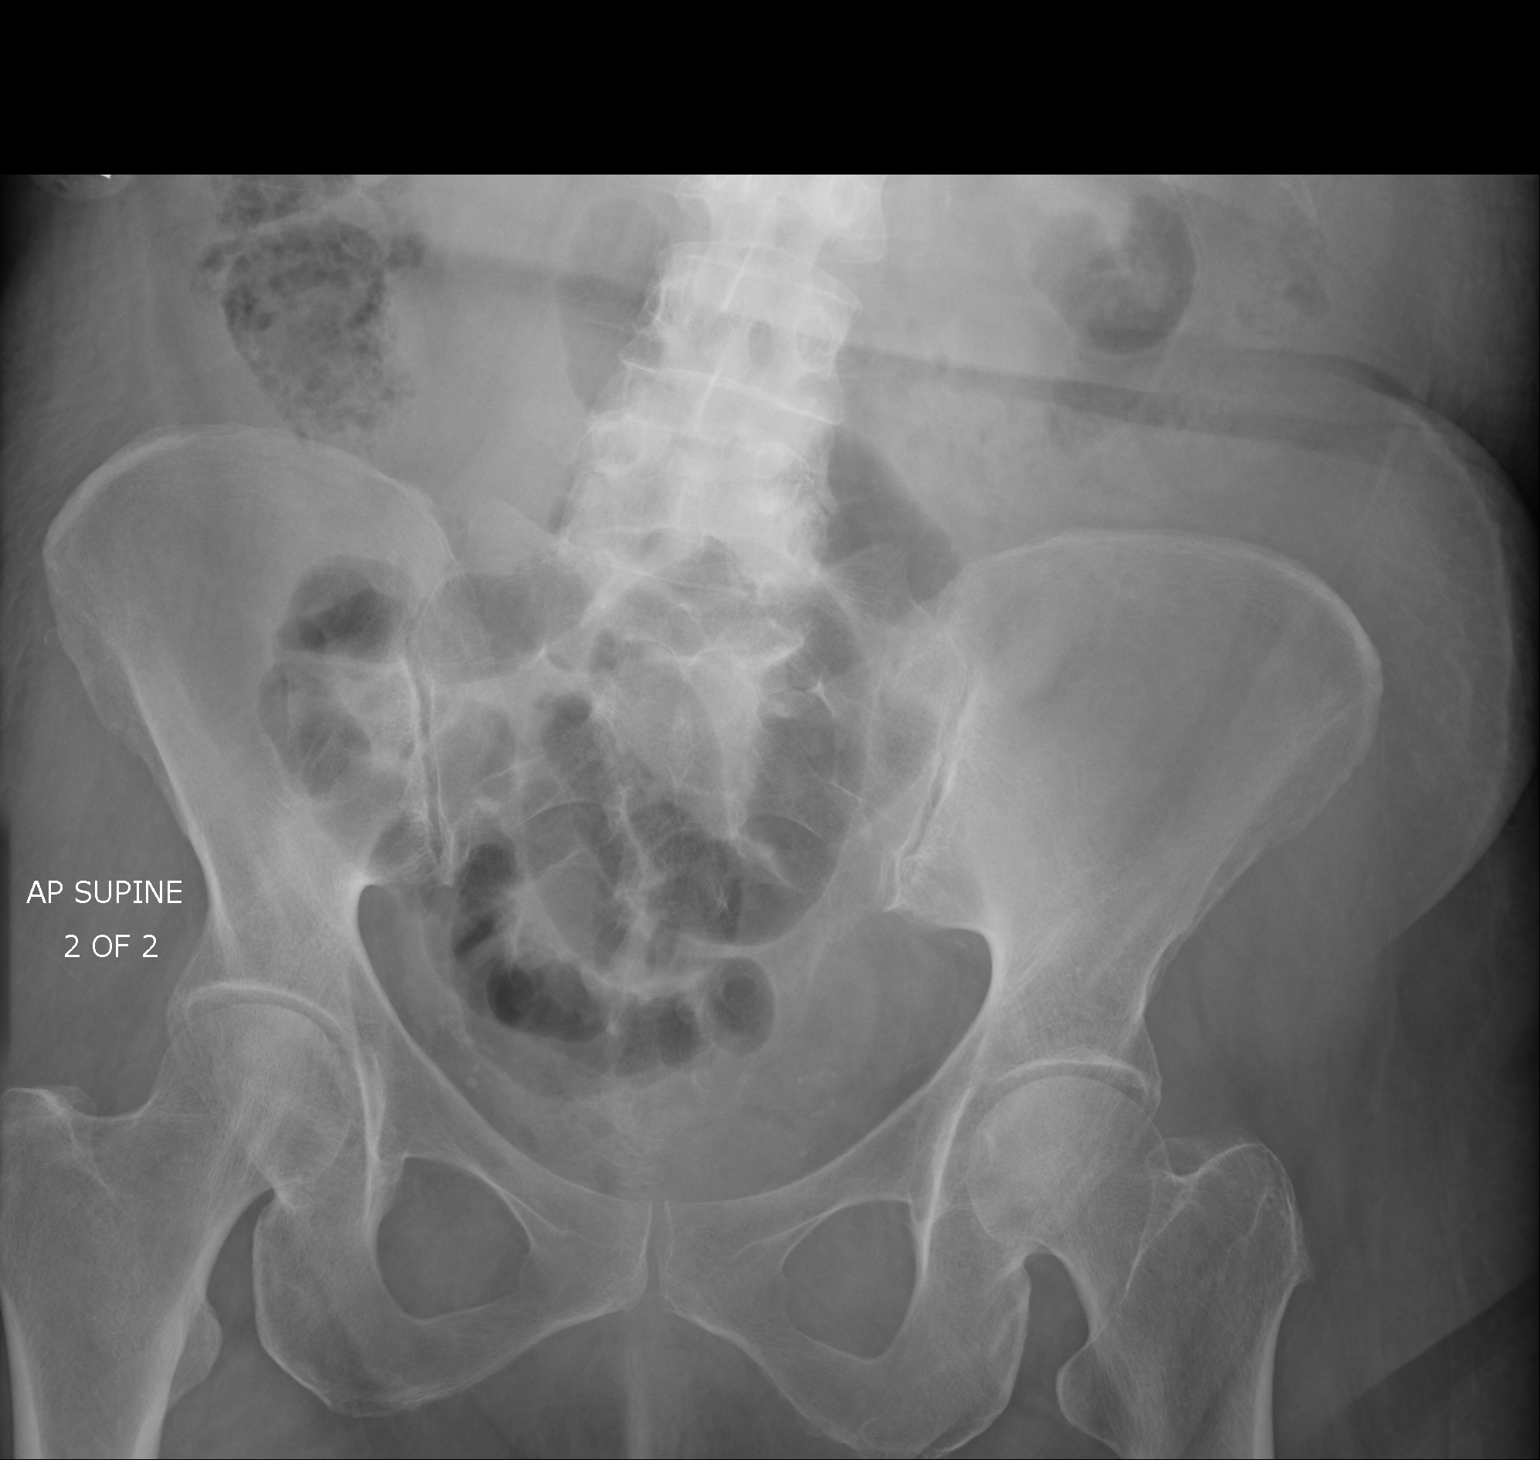

[2 of 2 positions shown; findings below may reference images not displayed]

FINDINGS: There is moderate stool in the colon. There is no bowel dilatation
or air-fluid level suggesting obstruction. No free air is seen on
this supine examination. There are phleboliths in the pelvis. There
is lumbar levoscoliosis.
IMPRESSION: Bowel gas pattern unremarkable.  Moderate stool in colon.

## 2017-06-23 IMAGING — CT CT ABD-PELV W/ CM
1 of 3 series · 14 of 32 positions shown, 19 images · IV contrast (omnipaque)
Comparison: 07/03/2015

CLINICAL DATA: Abdominal pain, nausea, vomiting, and diarrhea for 4
days. Patient finished treatment 2 days ago for C difficile.

EXAM:
CT ABDOMEN AND PELVIS WITH CONTRAST
TECHNIQUE: Multidetector CT imaging of the abdomen and pelvis was performed
using the standard protocol following bolus administration of
intravenous contrast.
CONTRAST:  100mL OMNIPAQUE IOHEXOL 300 MG/ML  SOLN

[Series 2: routine abd pel with · axial · 0.74mm/px · z∈[-428,+32]mm · 14 of 104 slices shown, 19 images]
[im 6/104  soft-tissue]
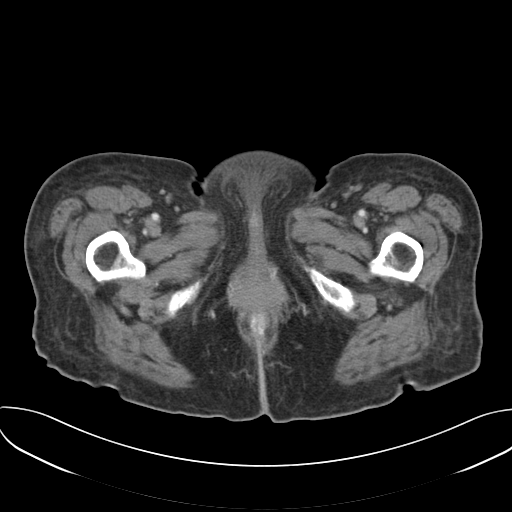
[im 6/104  bone]
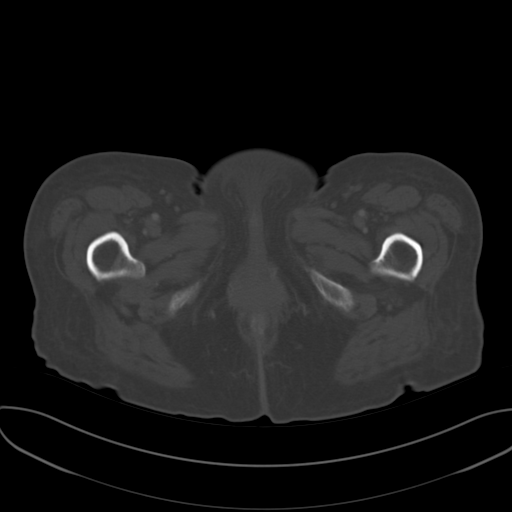
[im 16/104  soft-tissue]
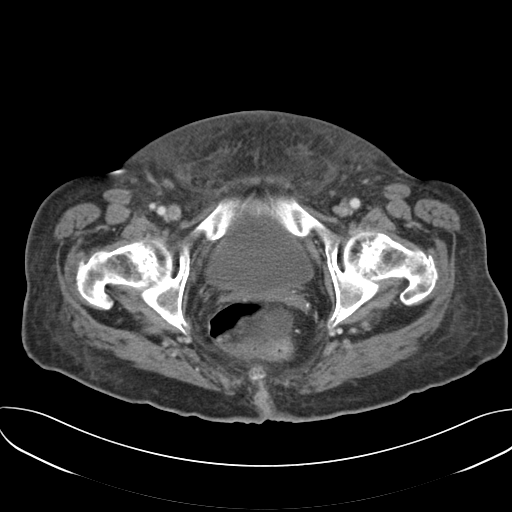
[im 21/104  soft-tissue]
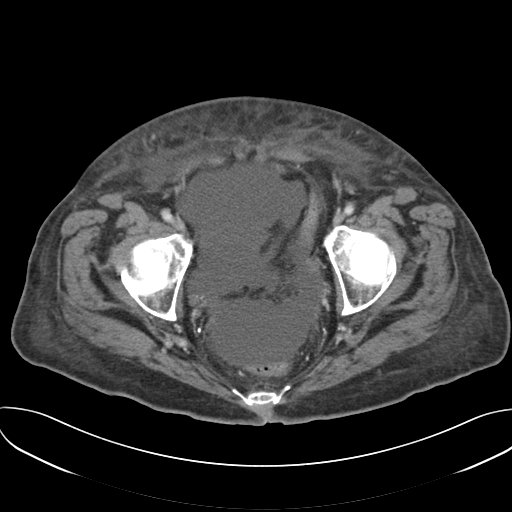
[im 31/104  soft-tissue]
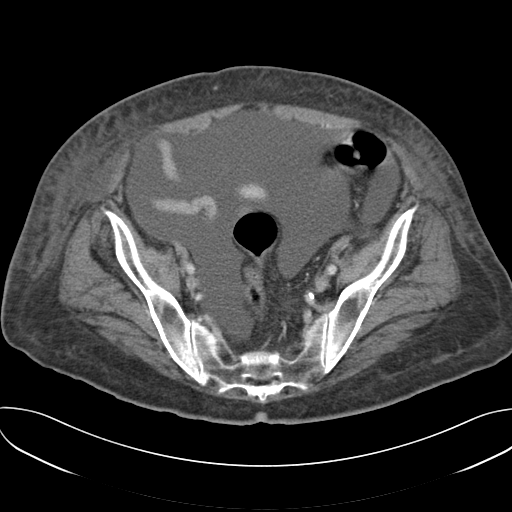
[im 37/104  soft-tissue]
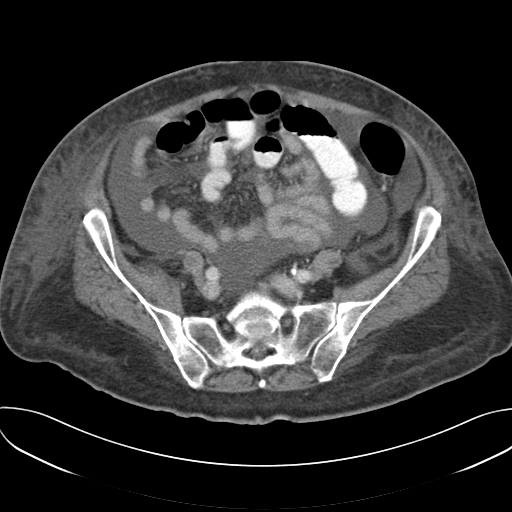
[im 47/104  soft-tissue]
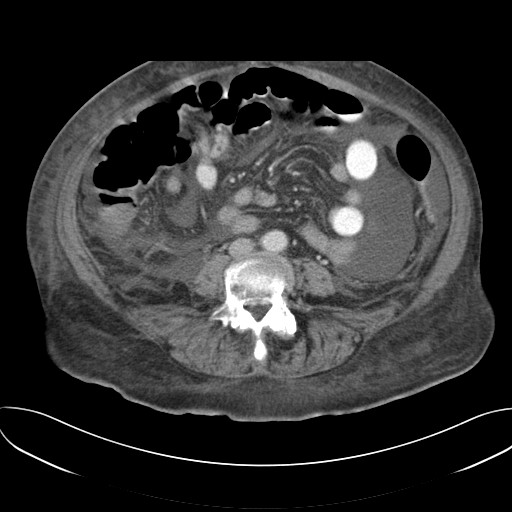
[im 52/104  soft-tissue]
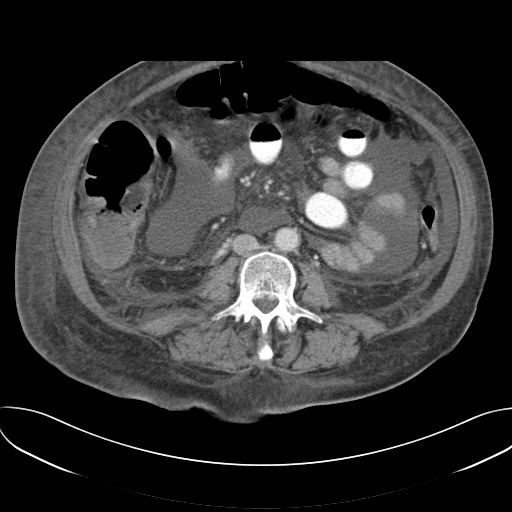
[im 57/104  soft-tissue]
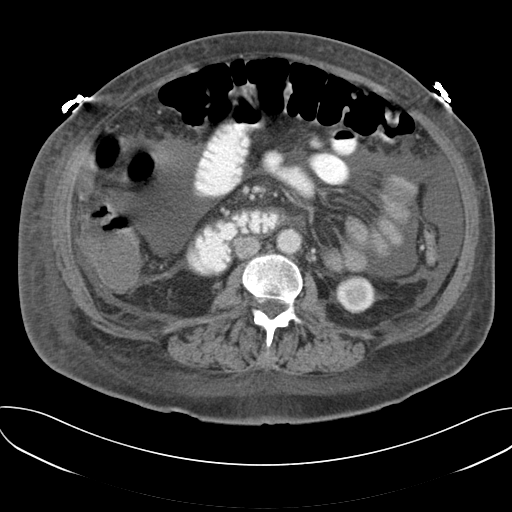
[im 67/104  soft-tissue]
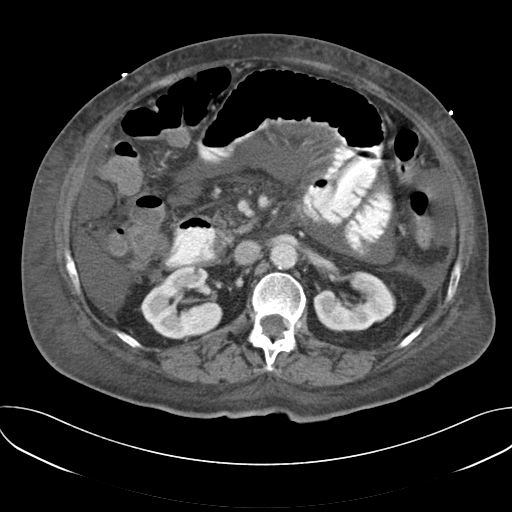
[im 67/104  bone]
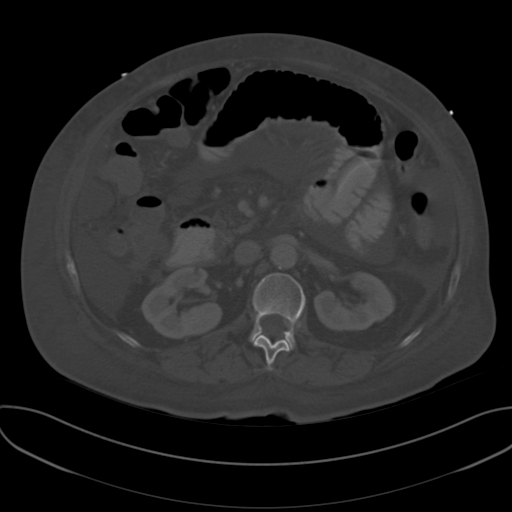
[im 73/104  soft-tissue]
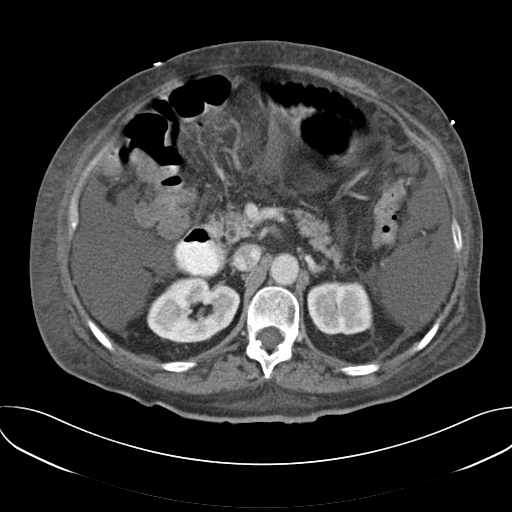
[im 83/104  soft-tissue]
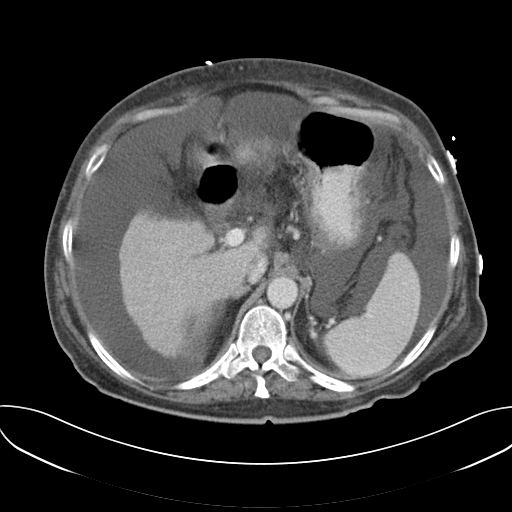
[im 83/104  lung]
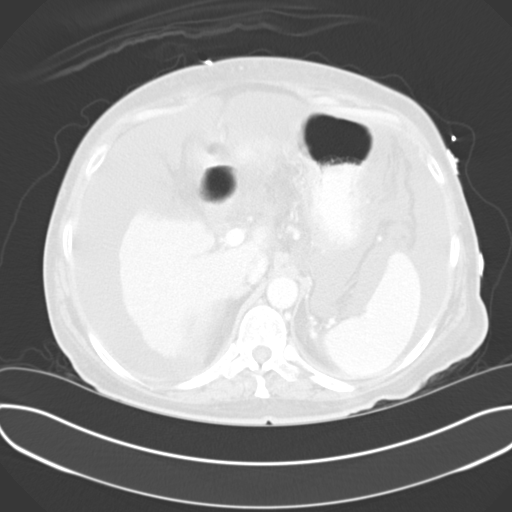
[im 88/104  soft-tissue]
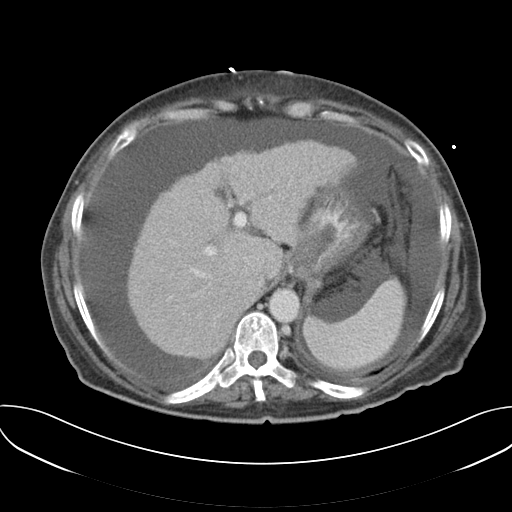
[im 88/104  lung]
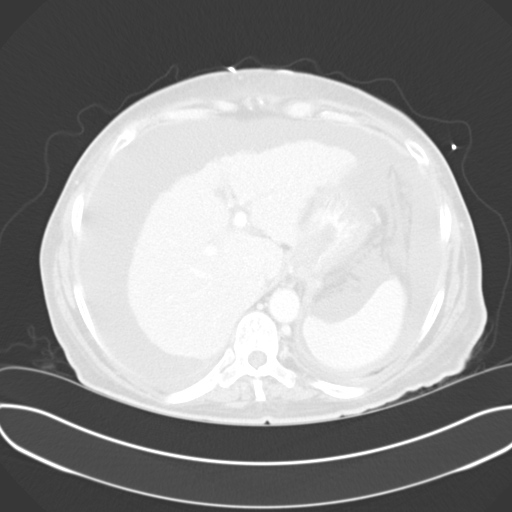
[im 93/104  lung]
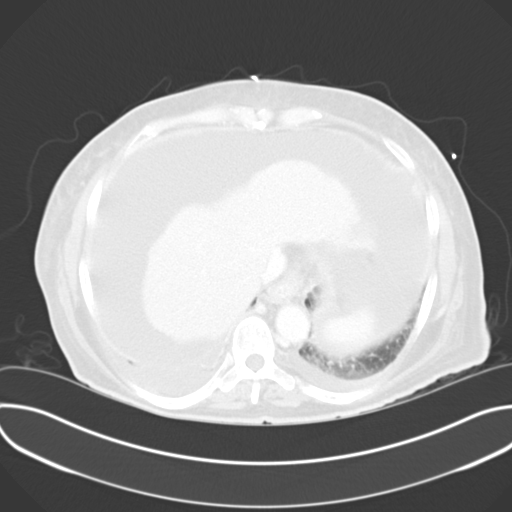
[im 98/104  soft-tissue]
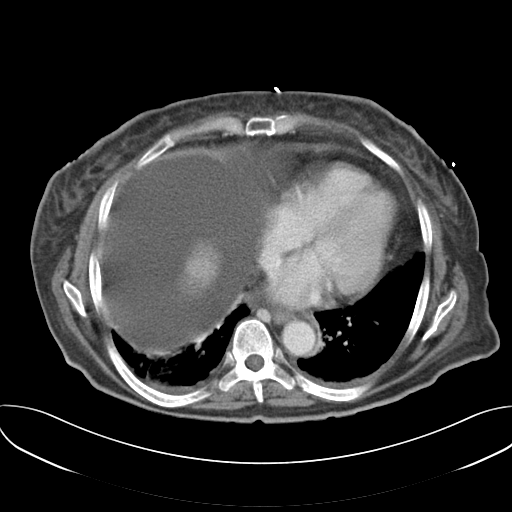
[im 98/104  lung]
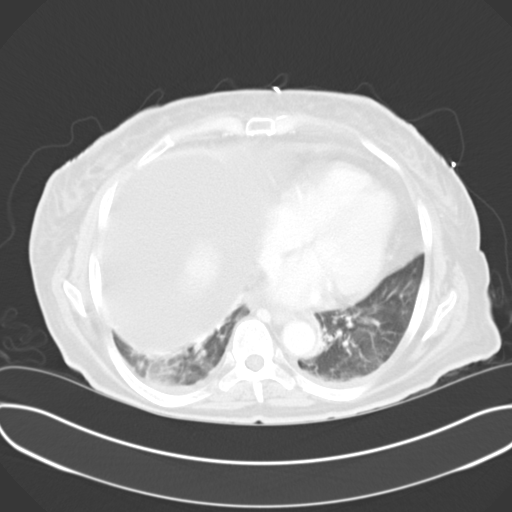

[14 of 32 positions shown; findings below may reference images not displayed]

FINDINGS: Small bilateral pleural effusions with infiltration or atelectasis
in the lung bases, greater on the right.

Prominent diffuse abdominal ascites, increasing since prior study.
No free air. Nodular contour of the liver consistent with hepatic
cirrhosis. No focal liver lesions. Surgical absence of the
gallbladder. No bile duct dilatation. Spleen size is normal. The
pancreas, adrenal glands, kidneys, abdominal aorta, inferior vena
cava, and retroperitoneal lymph nodes are unremarkable. Filling
defect again demonstrated in the cardiac region and along the lesser
curvature of the stomach. Stomach is not distended. Mild distention
of proximal small bowel with decompressed ileum. Contrast material
is shown in the distal small bowel. Consistent with partial
obstruction. Transition zone appears to be in the left mid abdomen.
No cause for obstruction is demonstrated. No appreciable wall
thickening. Colon is mostly decompressed.

Pelvis: Appendix is not identified. Diffuse pelvic ascites. No
pelvic mass or lymphadenopathy. Bladder is decompressed. No
destructive bone lesions.
IMPRESSION: Small bilateral pleural effusions with infiltration or atelectasis
in the lung bases. Changes of hepatic cirrhosis with prominent
diffuse abdominal ascites, increasing since previous study. Proximal
small bowel distention with transition zone to decompressed bowel in
the left mid abdomen. Changes are consistent with partial
obstruction. Filling defect in the cardia/lesser curvature of the
stomach again demonstrated.

## 2017-06-23 IMAGING — CR DG ABDOMEN ACUTE W/ 1V CHEST
1 series · 4 of 4 positions shown · non-contrast
Comparison: Abdomen 07/06/2015.  Chest 07/06/2015.

CLINICAL DATA: Entire abdominal pain for 5 days. Diarrhea. No
vomiting. Abdominal pain and distention.

EXAM:
DG ABDOMEN ACUTE W/ 1V CHEST

[Series 1: dg abd acute w/chest · 0.14mm/px · 4 of 4 slices shown]
[im 1/4]
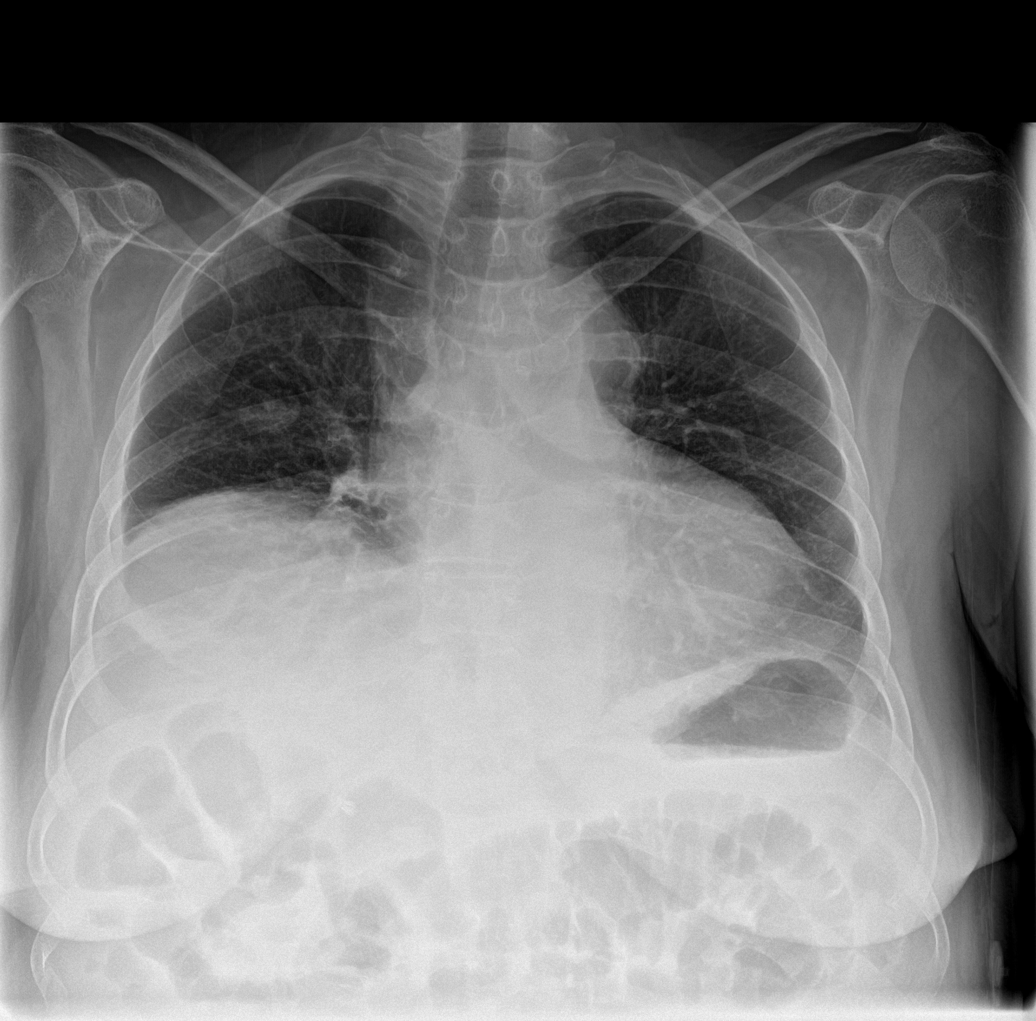
[im 2/4]
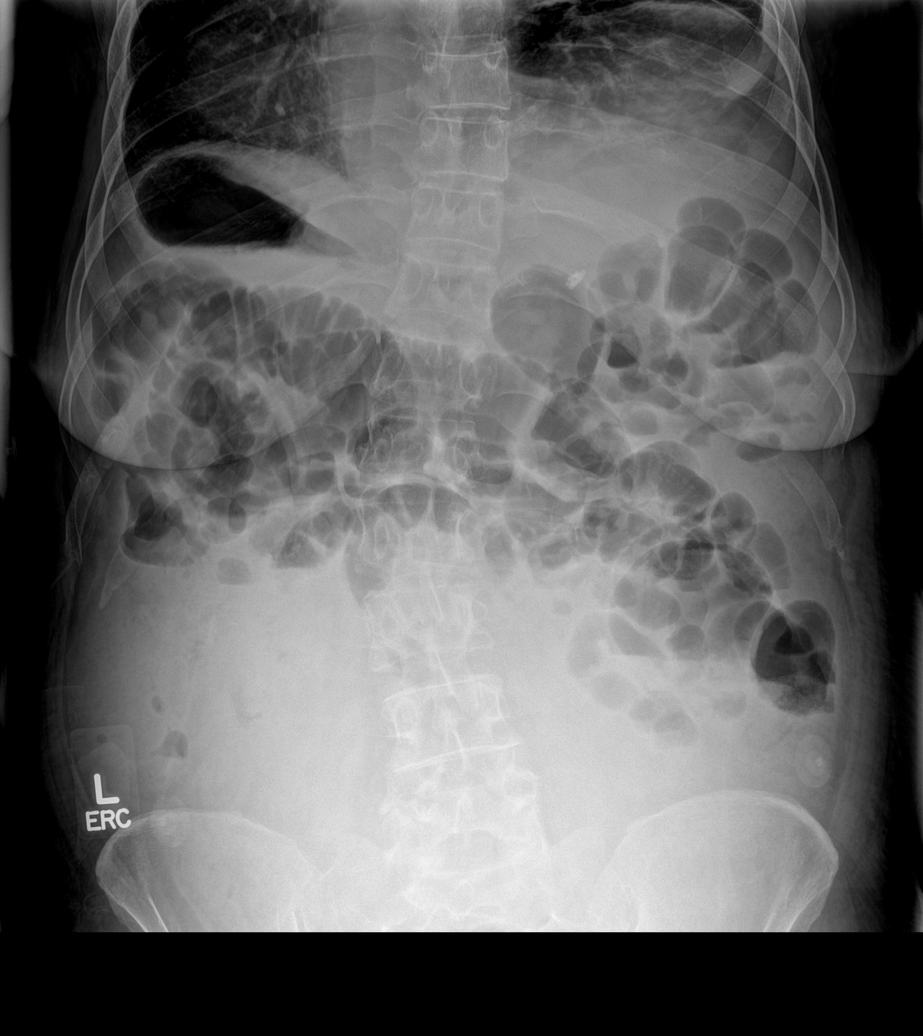
[im 3/4]
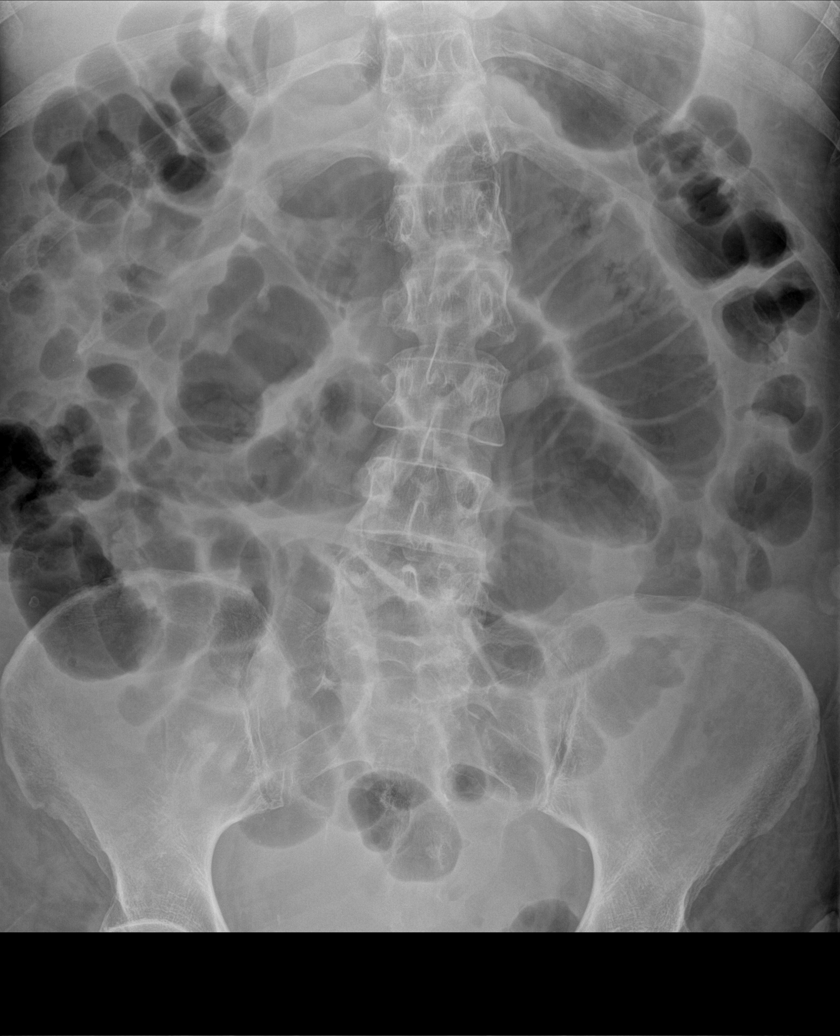
[im 4/4]
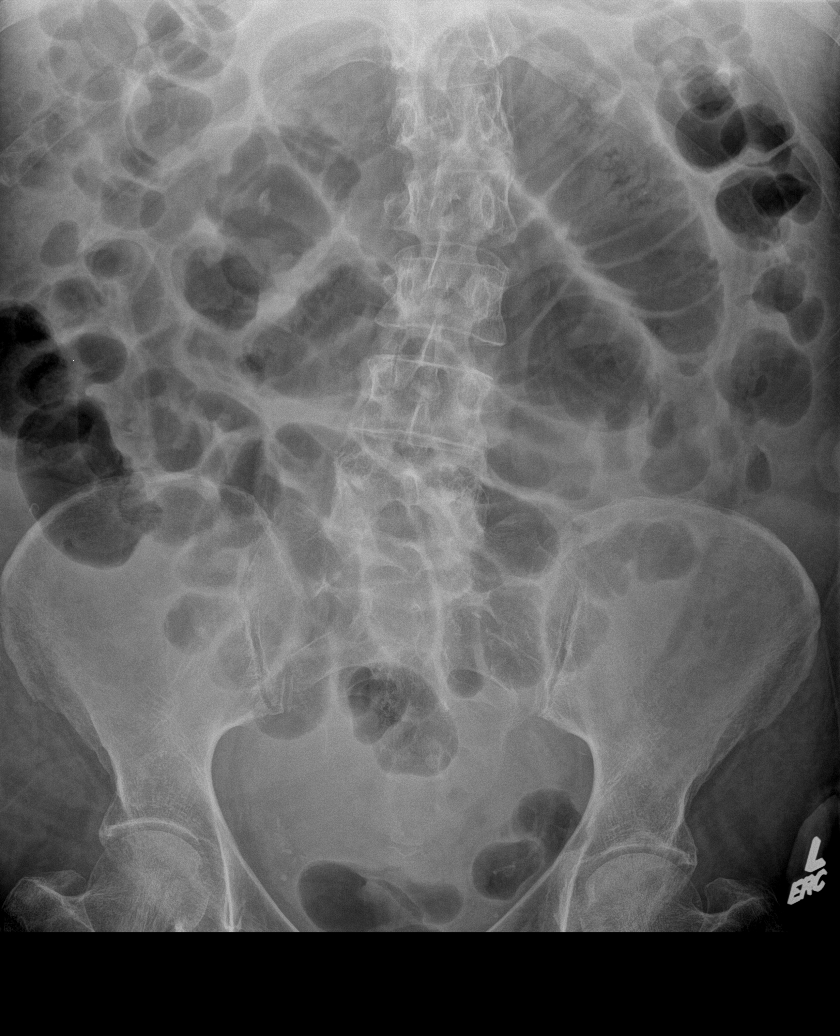

[4 of 4 positions shown; findings below may reference images not displayed]

FINDINGS: Vague nodular opacity in the right mid lung measuring 2 cm diameter.
This was not identified previously. I believe this represents a
patch for an EKG lead. Suggest visual inspection of the patient and
repeat imaging with removal of the knee EKG stay occurs. Mild
cardiac enlargement with normal pulmonary vascularity. Mediastinal
contours appear intact. Shallow inspiration with infiltration or
atelectasis in the right lung base and blunting of the right
costophrenic angle suggesting a small effusion. Changes may indicate
pneumonia.

Gas-filled and distended mid abdominal small bowel with air-fluid
levels on the upright view suggesting partial obstruction. Gas is
demonstrated in the colon and rectum. Colon is not dilated. No free
intra-abdominal air. Lumbar scoliosis convex towards the left.
Surgical clips in the right upper quadrant.
IMPRESSION: Volume loss, atelectasis, and small effusion in the right lung base
likely representing pneumonia. Indeterminate 2 cm right mid lung
nodular opacity. This probably represents an EKG lead.

Distended gas-filled mid abdominal small bowel with air-fluid levels
suggesting partial obstruction.

## 2017-06-24 DIAGNOSIS — M5412 Radiculopathy, cervical region: Secondary | ICD-10-CM | POA: Diagnosis not present

## 2017-06-24 DIAGNOSIS — M5416 Radiculopathy, lumbar region: Secondary | ICD-10-CM | POA: Diagnosis not present

## 2017-06-24 DIAGNOSIS — M545 Low back pain: Secondary | ICD-10-CM | POA: Diagnosis not present

## 2017-06-24 DIAGNOSIS — G894 Chronic pain syndrome: Secondary | ICD-10-CM | POA: Diagnosis not present

## 2017-06-24 DIAGNOSIS — I10 Essential (primary) hypertension: Secondary | ICD-10-CM | POA: Diagnosis not present

## 2017-06-24 DIAGNOSIS — F1120 Opioid dependence, uncomplicated: Secondary | ICD-10-CM | POA: Diagnosis not present

## 2017-06-24 DIAGNOSIS — G8929 Other chronic pain: Secondary | ICD-10-CM | POA: Diagnosis not present

## 2017-06-24 DIAGNOSIS — M542 Cervicalgia: Secondary | ICD-10-CM | POA: Diagnosis not present

## 2017-06-24 IMAGING — US US PARACENTESIS
1 series · 9 of 9 positions shown · non-contrast
Comparison: CT scan of July 31, 2015.

CLINICAL DATA: Ascites.

EXAM:
ULTRASOUND GUIDED PARACENTESIS

[Series 1: us paracentesis · 0.26mm/px · 9 of 9 slices shown]
[im 1/9]
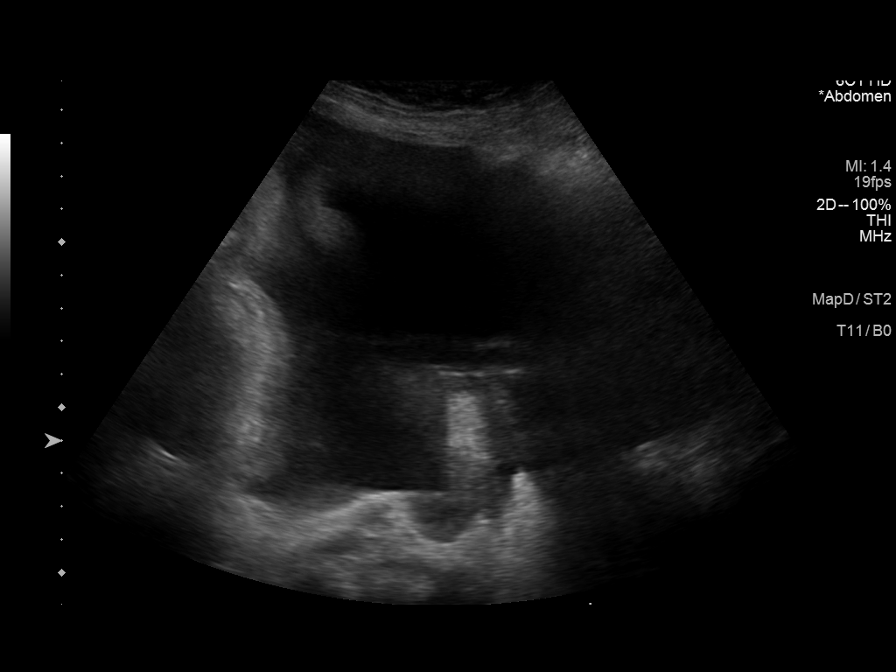
[im 2/9]
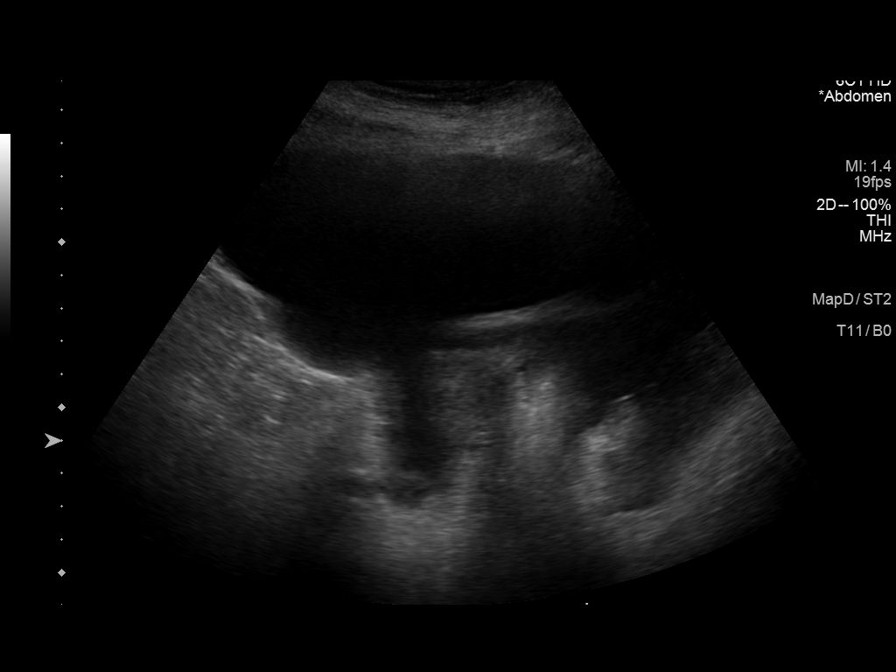
[im 3/9]
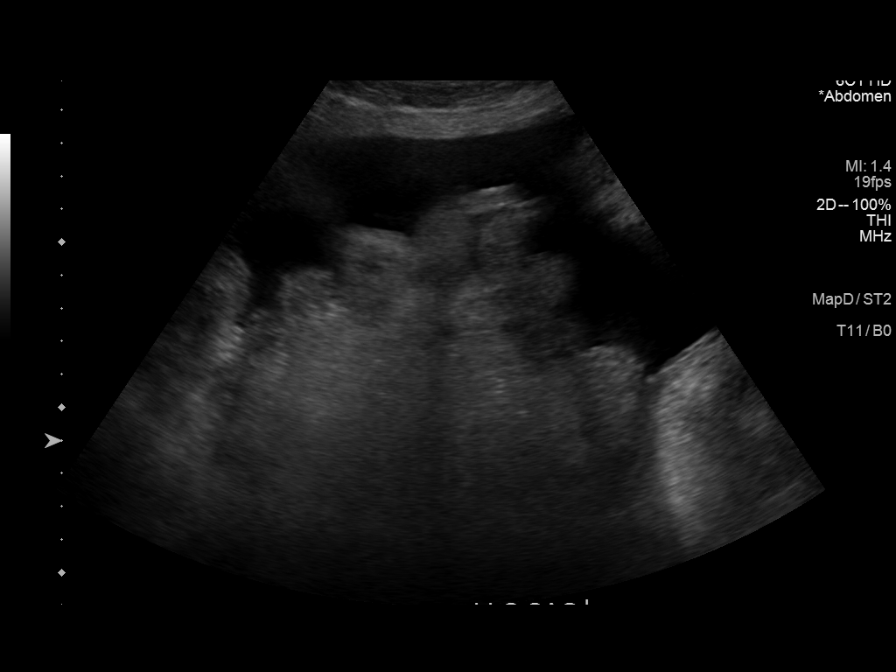
[im 4/9]
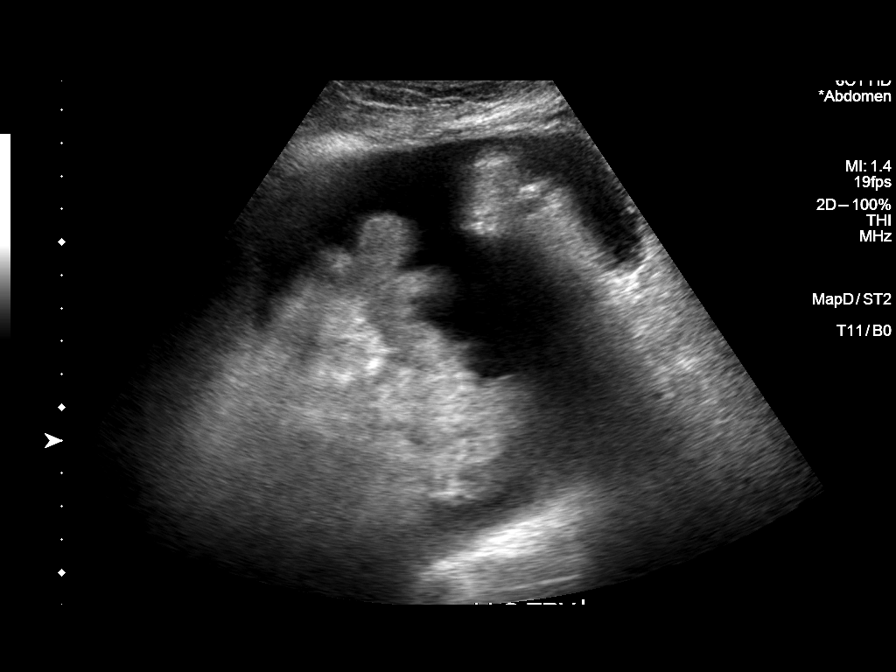
[im 5/9]
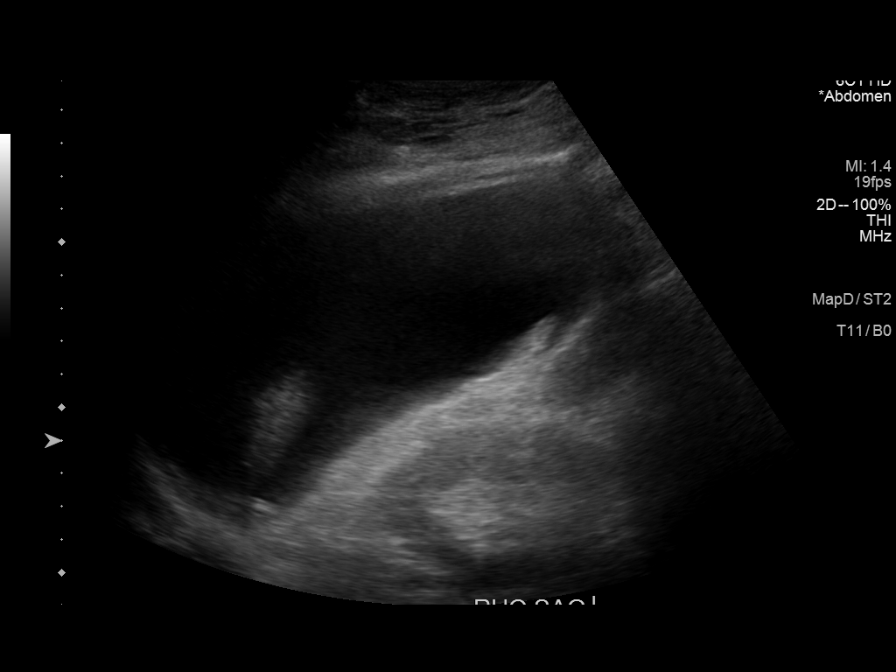
[im 6/9]
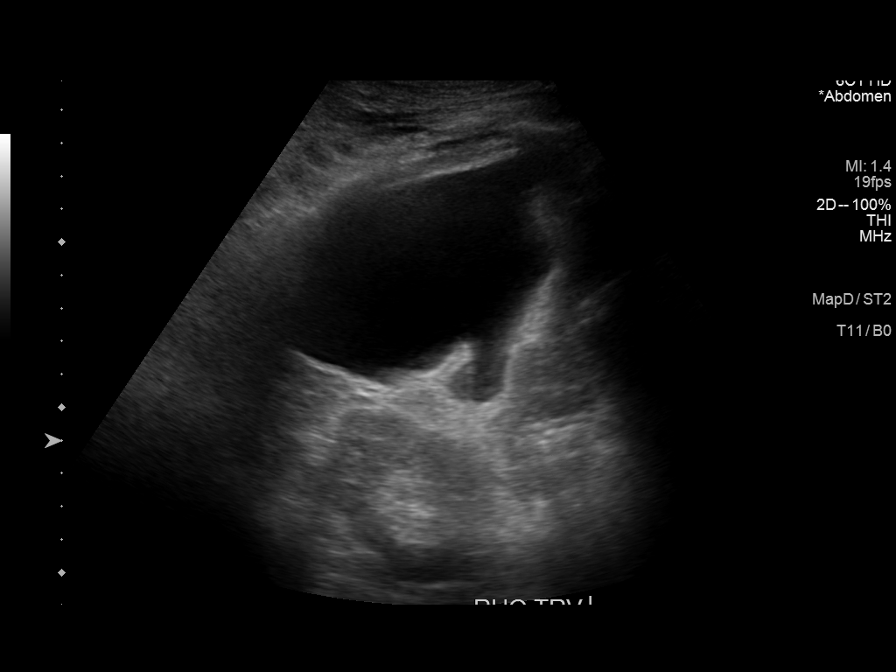
[im 7/9]
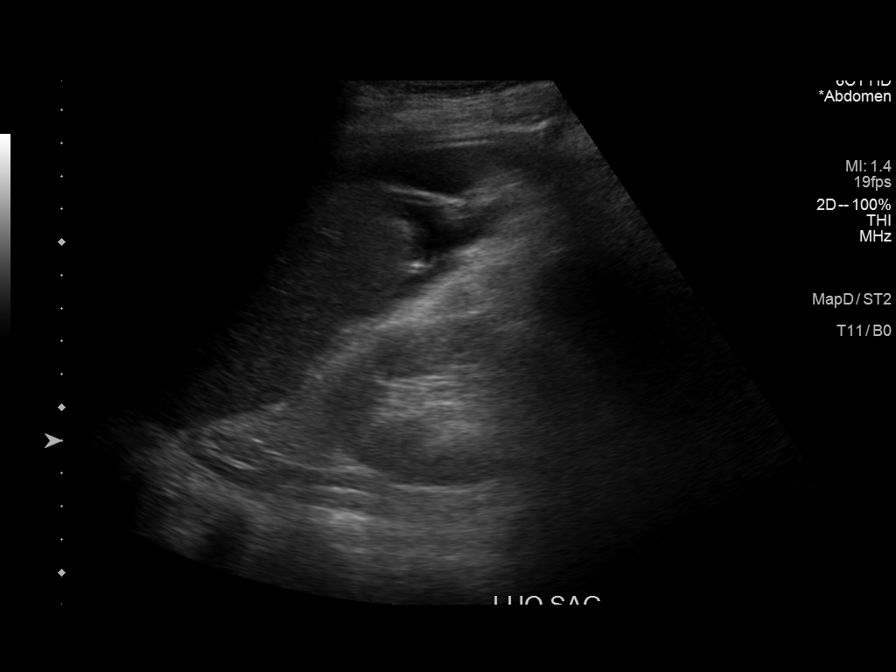
[im 8/9]
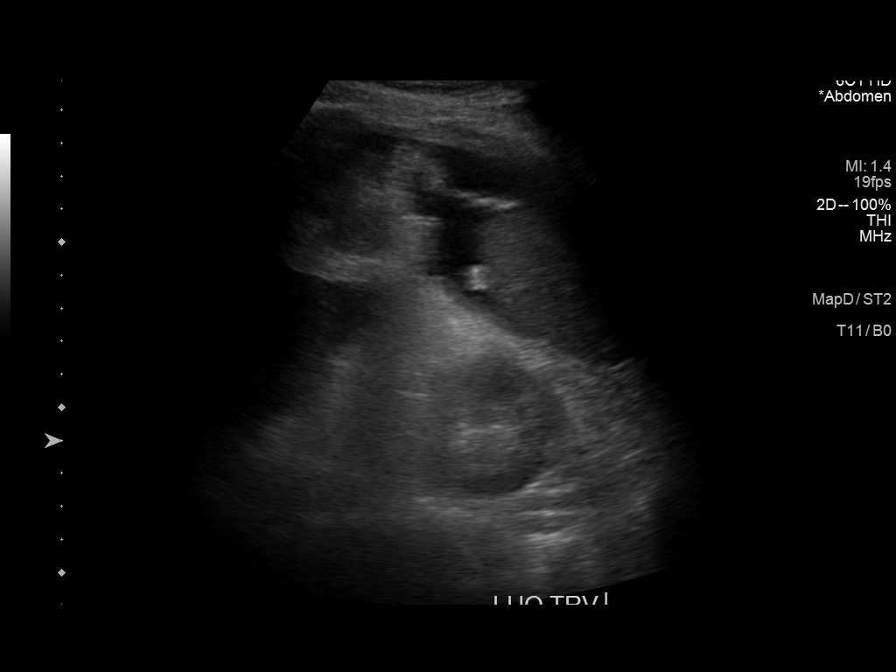
[im 9/9]
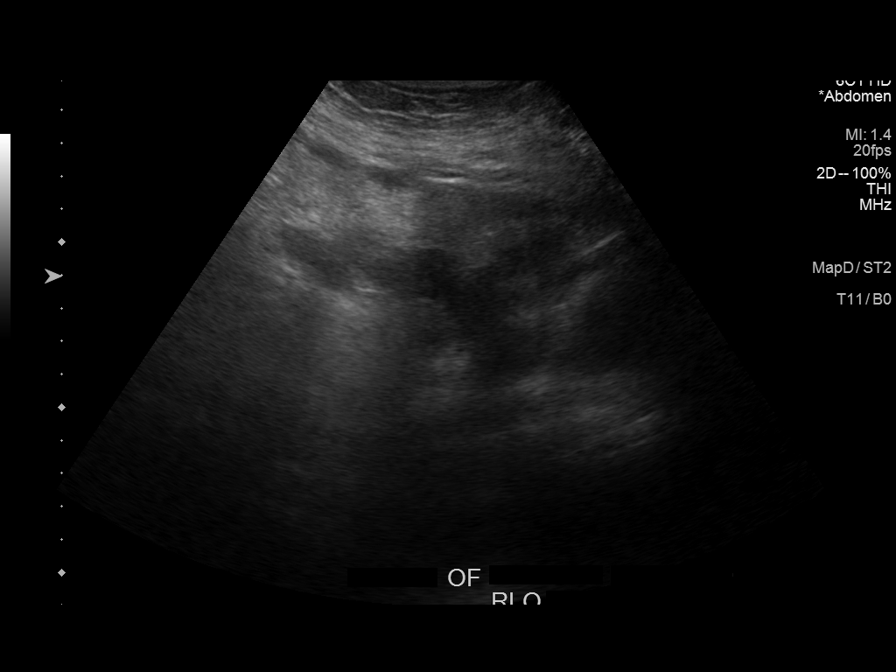

[9 of 9 positions shown; findings below may reference images not displayed]

PROCEDURE:
An ultrasound guided paracentesis was thoroughly discussed with the
patient and questions answered. The benefits, risks, alternatives
and complications were also discussed. The patient understands and
wishes to proceed with the procedure. Written consent was obtained.

Ultrasound was performed to localize and mark an adequate pocket of
fluid in the right lower quadrant of the abdomen. The area was then
prepped and draped in the normal sterile fashion. 1% Lidocaine was
used for local anesthesia. Under ultrasound guidance a
Safe-T-Centesis catheter was introduced. Paracentesis was performed.
The catheter was removed and a dressing applied.

COMPLICATIONS:
None immediate.
FINDINGS: A total of approximately 3.25 L of serous fluid was removed. A fluid
sample was sent for laboratory analysis.
IMPRESSION: Successful ultrasound guided paracentesis yielding 3.25 L of
ascites.

## 2017-06-25 IMAGING — CR DG ABDOMEN 1V
1 series · 2 of 2 positions shown · non-contrast
Comparison: 07/31/2015

CLINICAL DATA: Evaluation of abdominal discomfort.  Distention.

EXAM:
ABDOMEN - 1 VIEW

[Series 1: supine ap · 0.17mm/px · 2 of 2 slices shown]
[im 1/2]
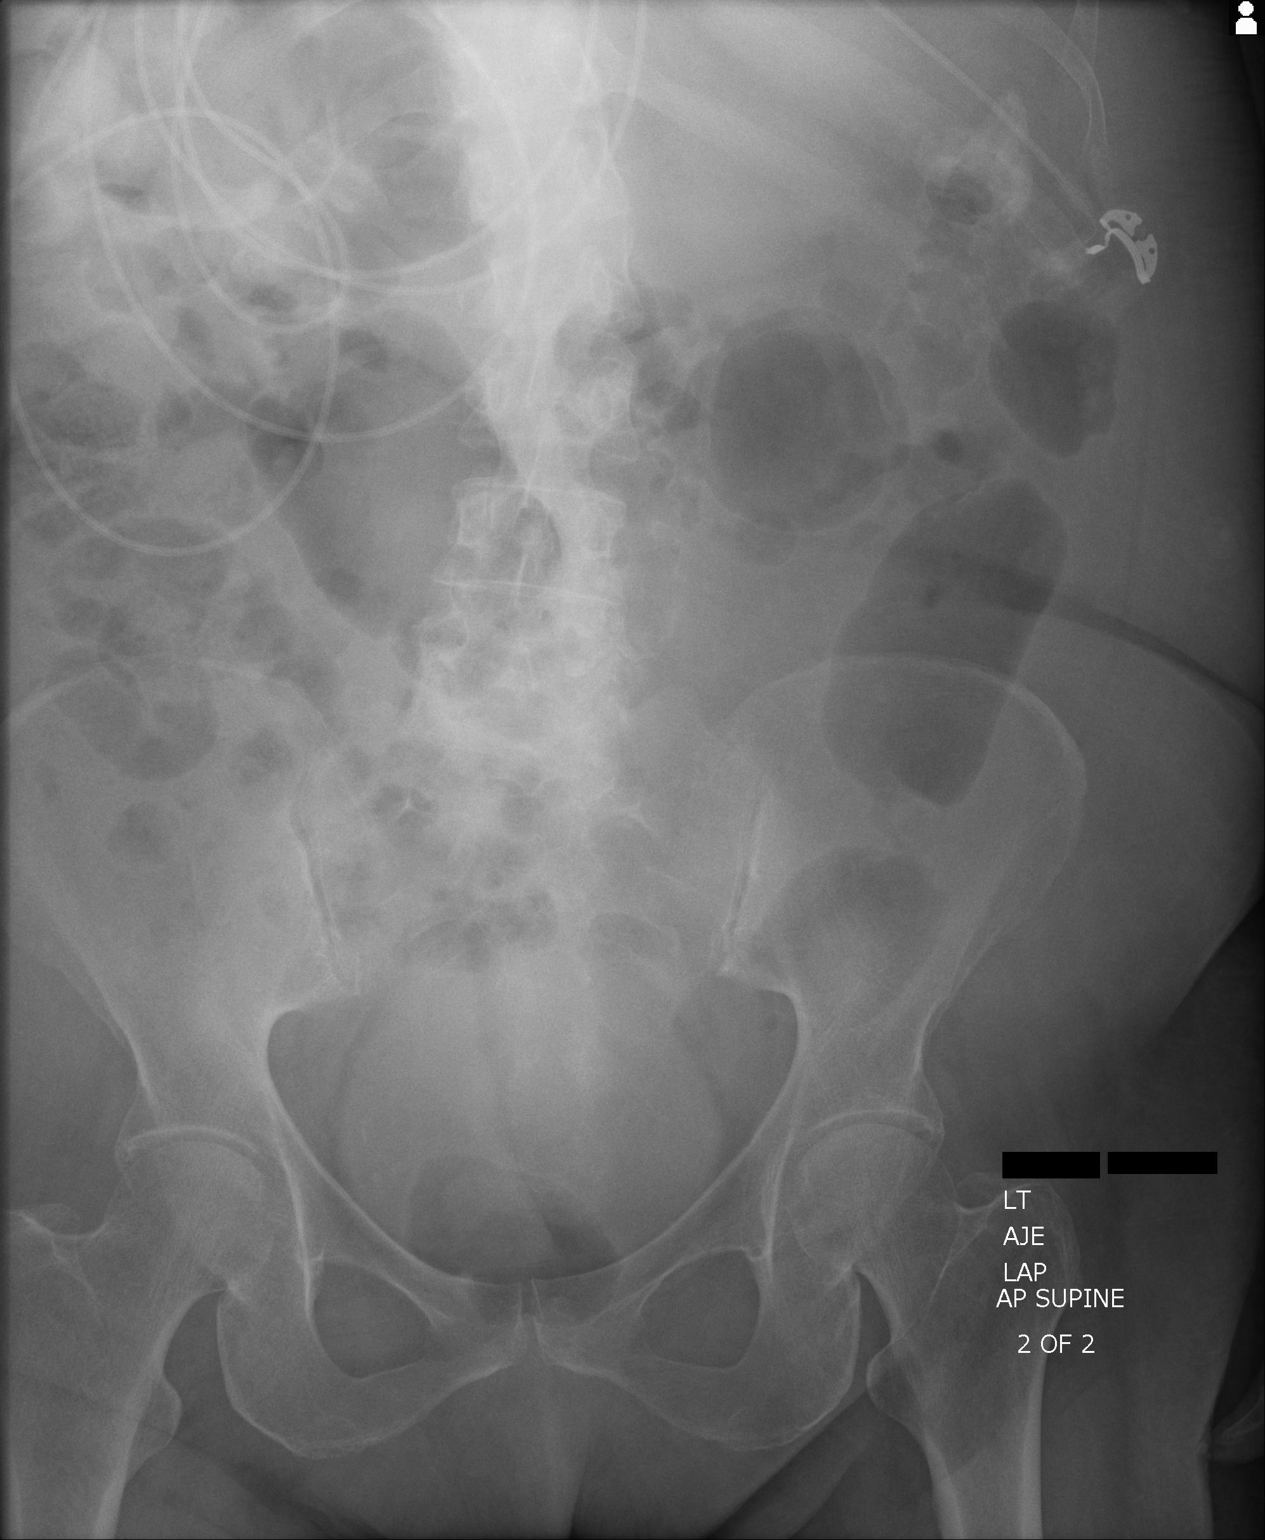
[im 2/2]
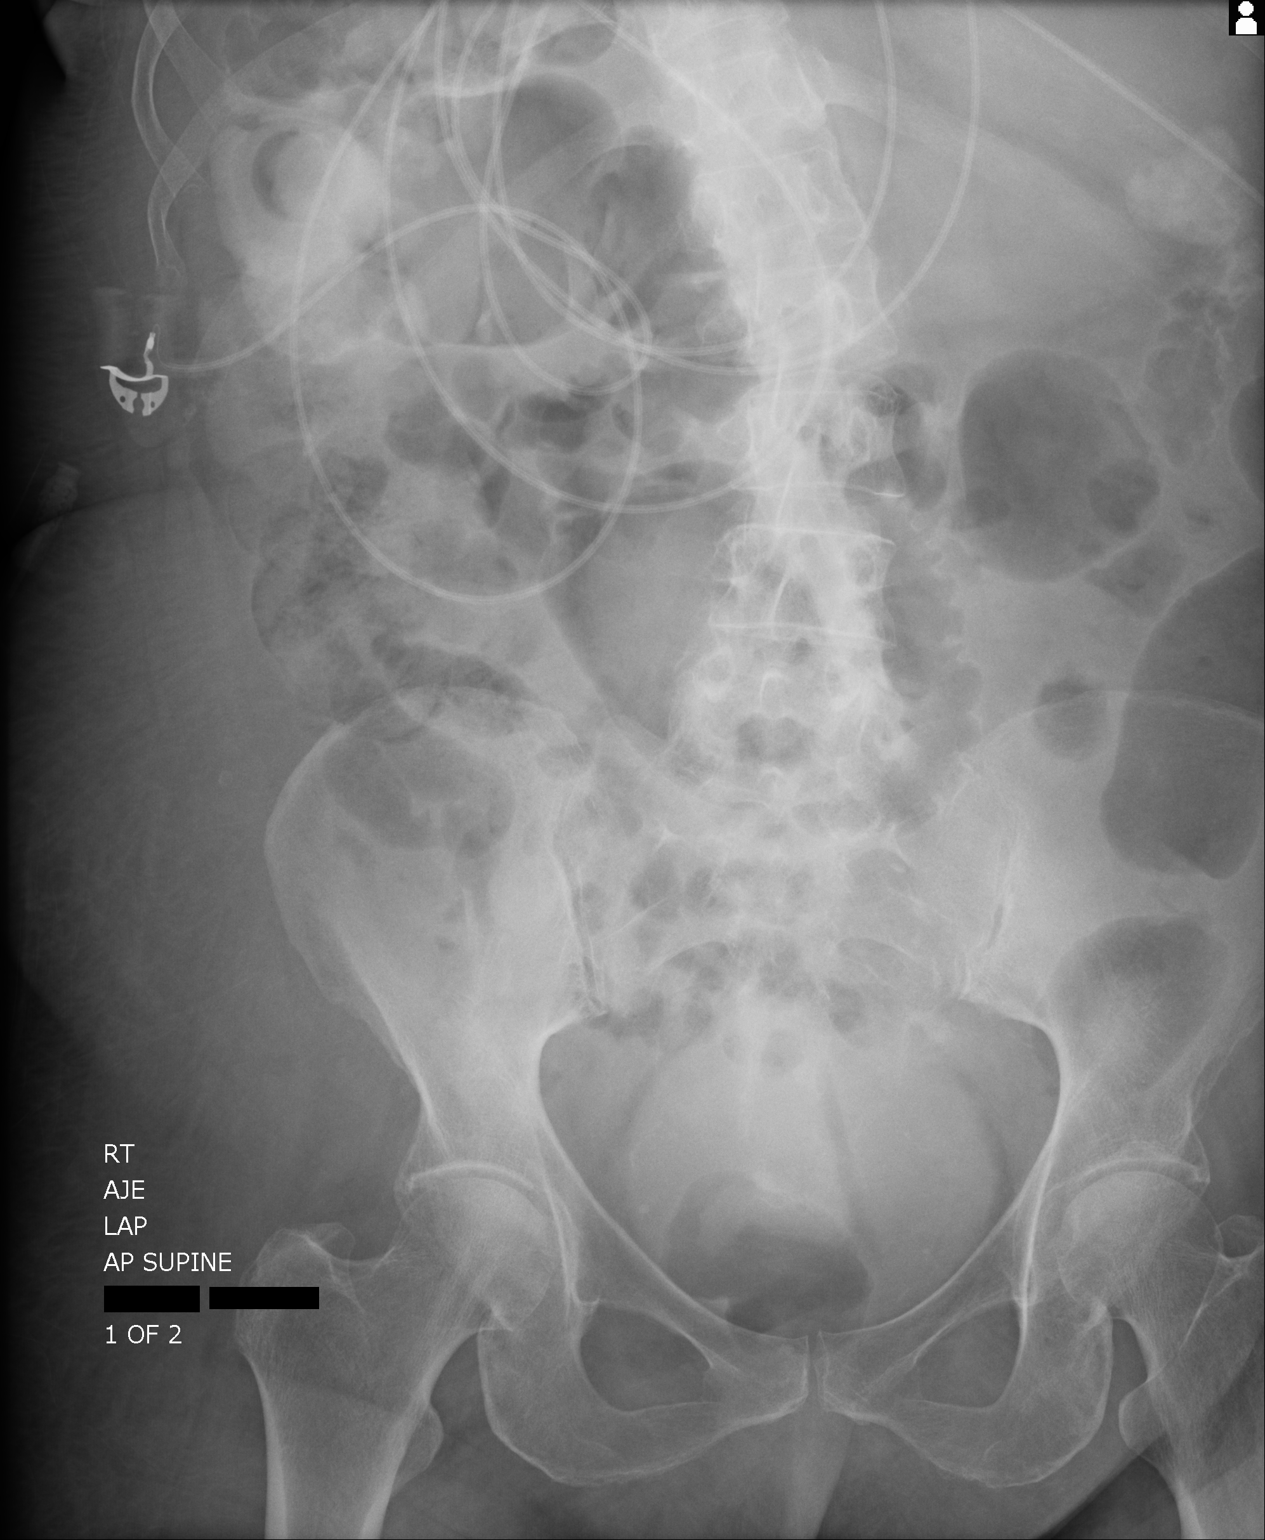

[2 of 2 positions shown; findings below may reference images not displayed]

FINDINGS: Gaseous distention of the small or large bowel loops appear improved
from previous exam. Enteric contrast material is identified within
the colon.
IMPRESSION: 1. Findings compatible with improving bowel obstruction.

## 2017-06-26 DIAGNOSIS — F1120 Opioid dependence, uncomplicated: Secondary | ICD-10-CM | POA: Diagnosis not present

## 2017-06-26 DIAGNOSIS — Z79899 Other long term (current) drug therapy: Secondary | ICD-10-CM | POA: Diagnosis not present

## 2017-06-27 IMAGING — CR DG ABDOMEN 1V
2 series · 2 of 2 positions shown · non-contrast
Comparison: Abdominal plain films dated 08/02/2015. Comparison also
to CT abdomen and pelvis dated 07/31/2015.

CLINICAL DATA: Small bowel obstruction. Generalized abdominal pain.

EXAM:
ABDOMEN - 1 VIEW

[abdomen kub (1 of 2)]
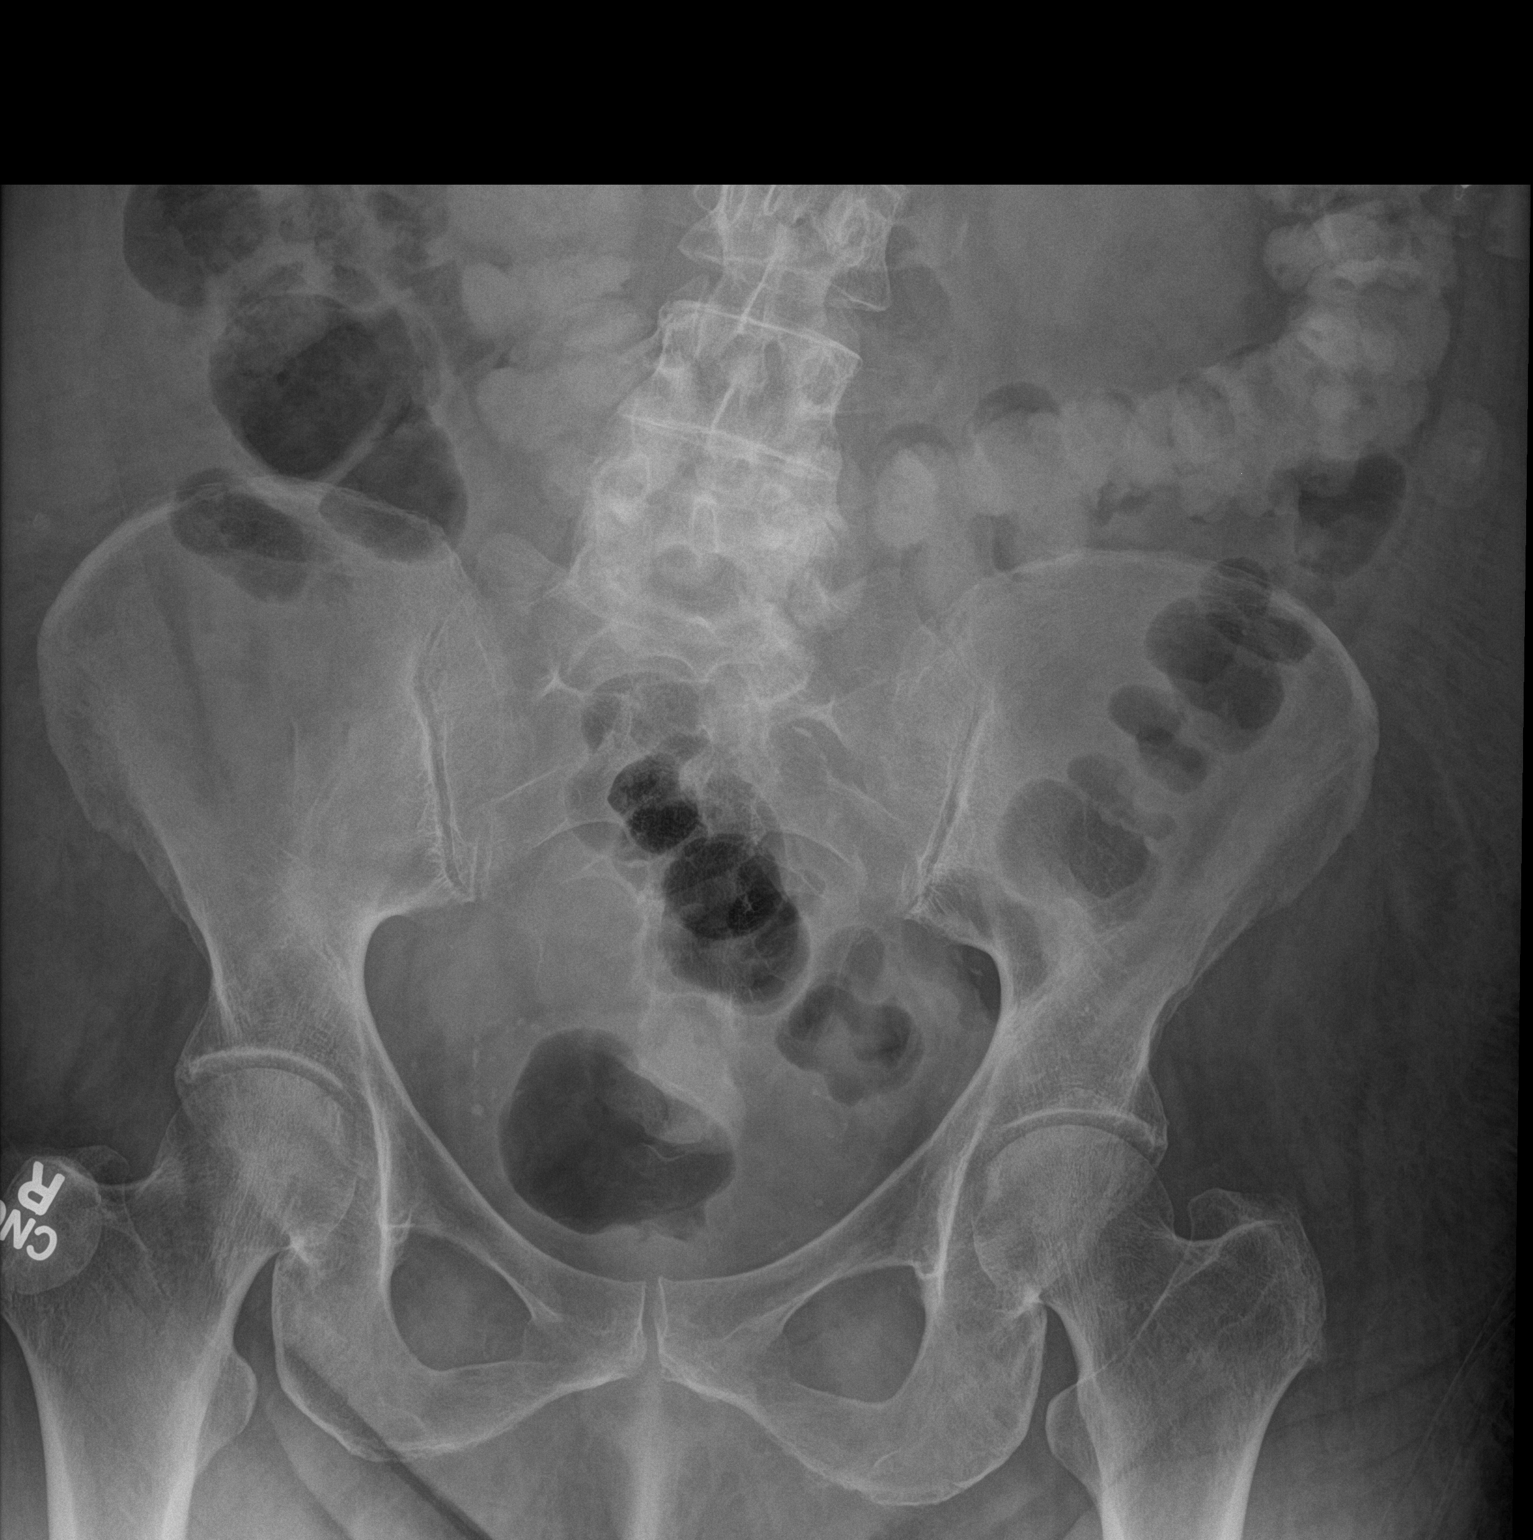

[abdomen kub (2 of 2)]
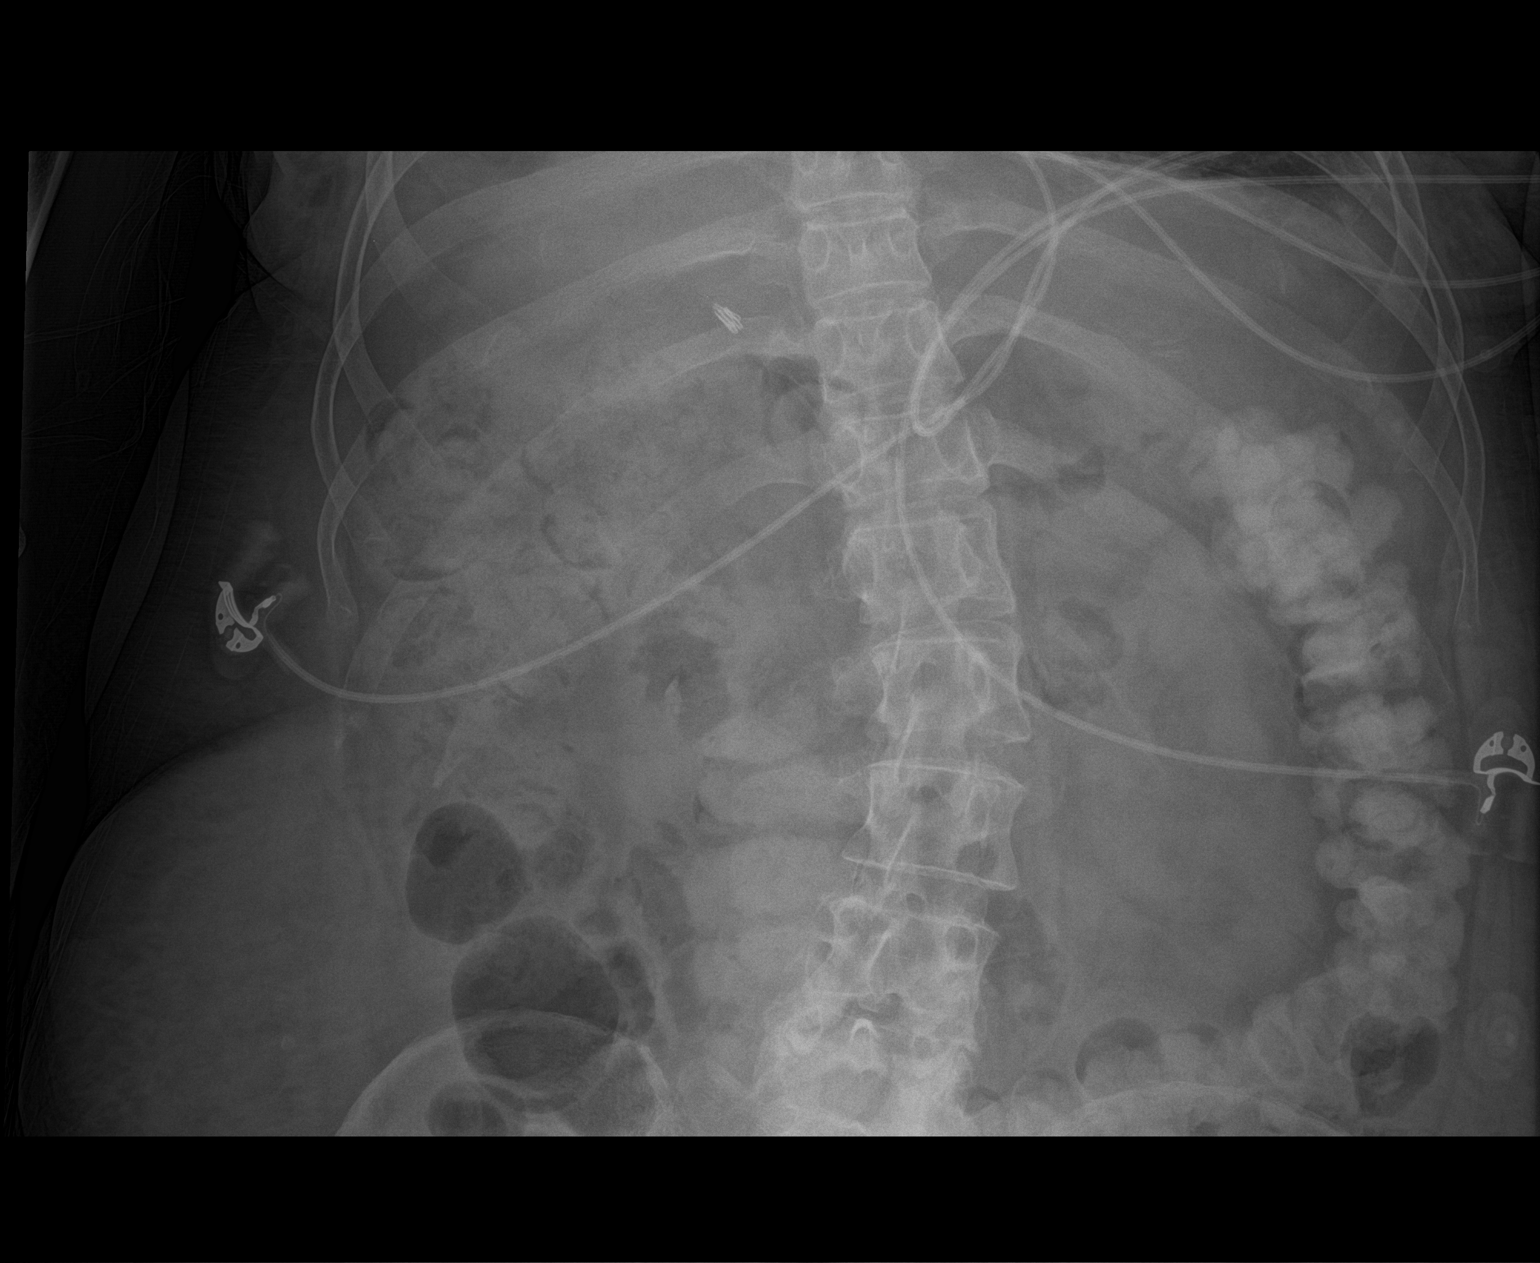

[2 of 2 positions shown; findings below may reference images not displayed]

FINDINGS: Overall bowel gas pattern is nonobstructive. Oral contrast
administered for the earlier CT has progressed to the distal colon.
Fairly large amount of stool is seen within the right colon. No
dilated bowel loops.

No evidence of soft tissue mass or localized fluid collection. No
evidence of free intraperitoneal air. Cholecystectomy clips noted
within the right upper quadrant.

Patchy opacities at the right lung base compatible with the
atelectasis seen on earlier CT. Degenerative changes again noted
within the scoliotic thoracolumbar spine. No acute osseous
abnormality seen.
IMPRESSION: Nonobstructive bowel gas pattern and no evidence of acute
intra-abdominal abnormality.

## 2017-07-07 IMAGING — CT CT ABD-PELV W/ CM
1 of 3 series · 13 of 32 positions shown, 18 images · IV contrast (omnipaque)
Comparison: CT of the abdomen and pelvis from 07/31/2015

CLINICAL DATA: Acute onset of nausea and anorexia. Generalized
abdominal pain. Initial encounter.

EXAM:
CT ABDOMEN AND PELVIS WITH CONTRAST
TECHNIQUE: Multidetector CT imaging of the abdomen and pelvis was performed
using the standard protocol following bolus administration of
intravenous contrast.
CONTRAST:  100mL OMNIPAQUE IOHEXOL 300 MG/ML  SOLN

[Series 2: routine abd pel with · axial · 0.68mm/px · z∈[-480,-20]mm · 13 of 104 slices shown, 18 images]
[im 6/104  soft-tissue]
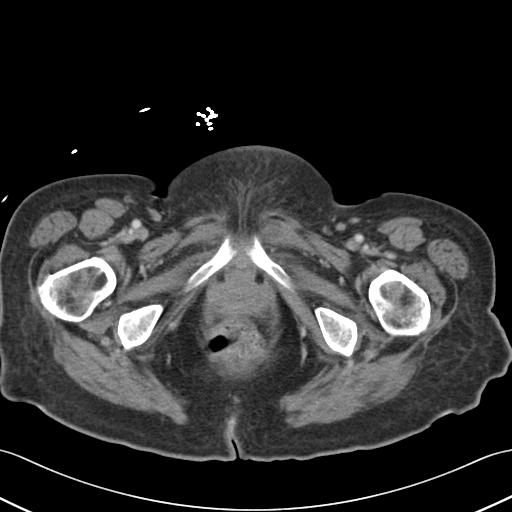
[im 6/104  bone]
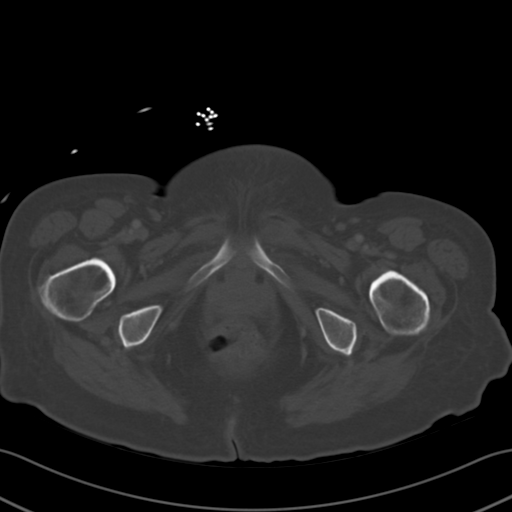
[im 17/104  soft-tissue]
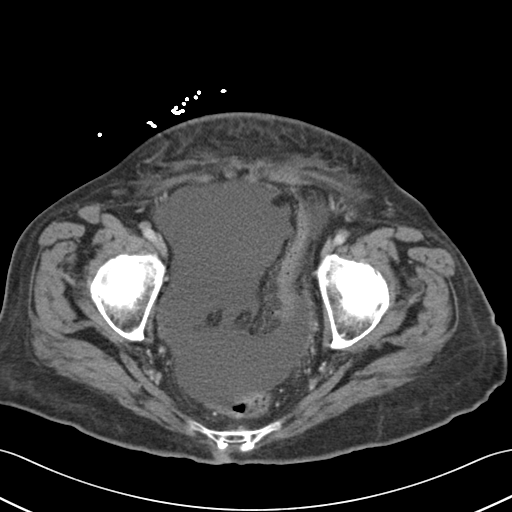
[im 22/104  soft-tissue]
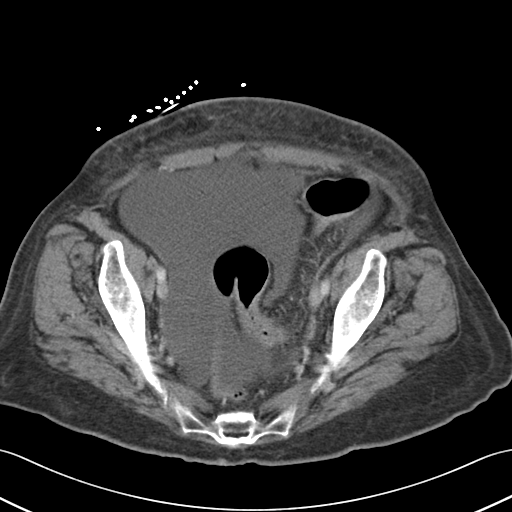
[im 33/104  soft-tissue]
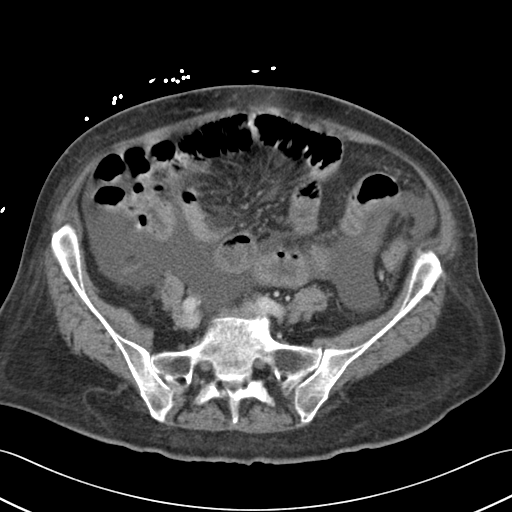
[im 38/104  soft-tissue]
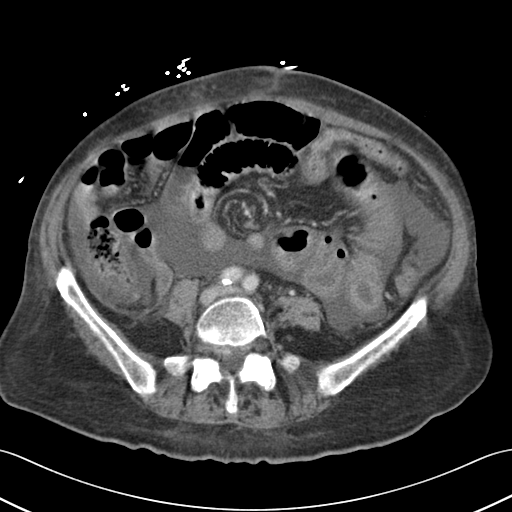
[im 49/104  soft-tissue]
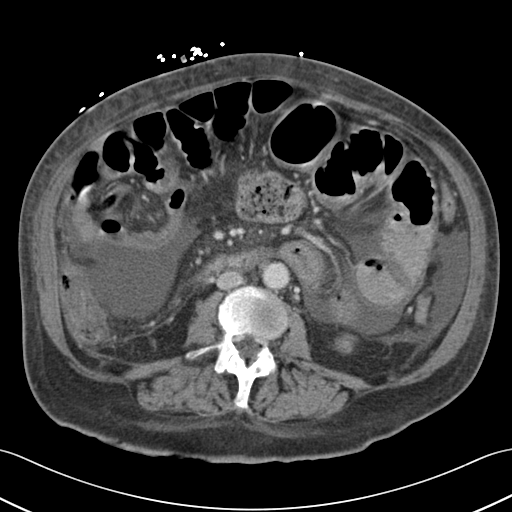
[im 55/104  soft-tissue]
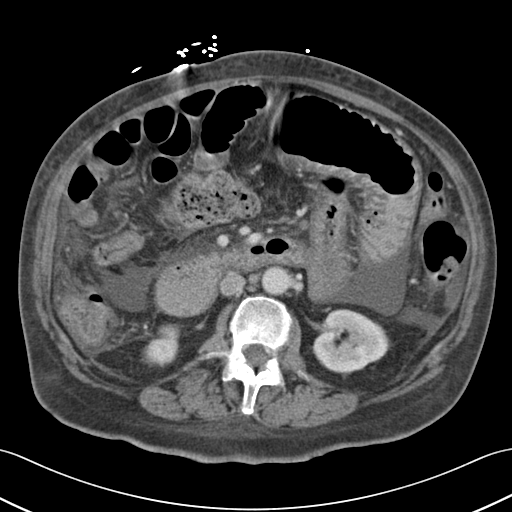
[im 66/104  soft-tissue]
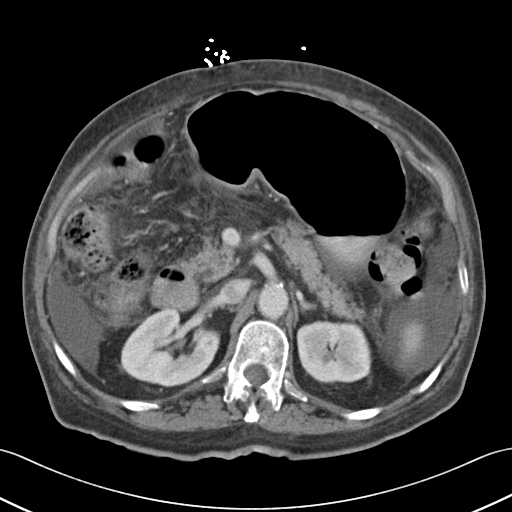
[im 71/104  soft-tissue]
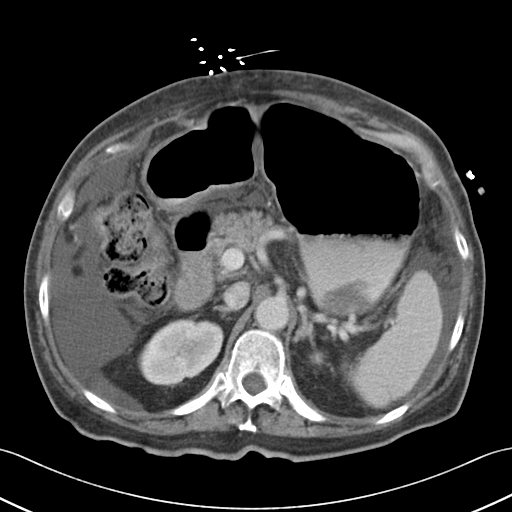
[im 71/104  bone]
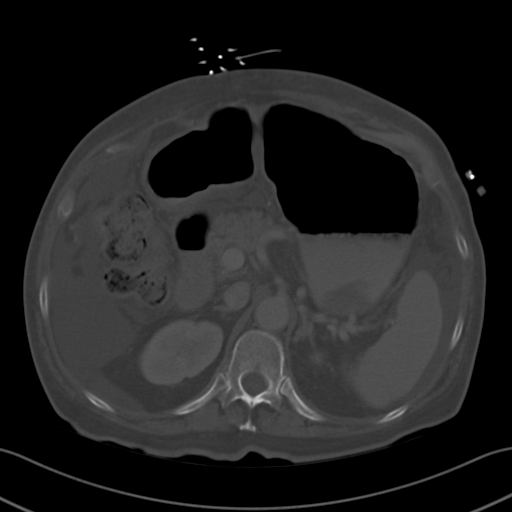
[im 82/104  soft-tissue]
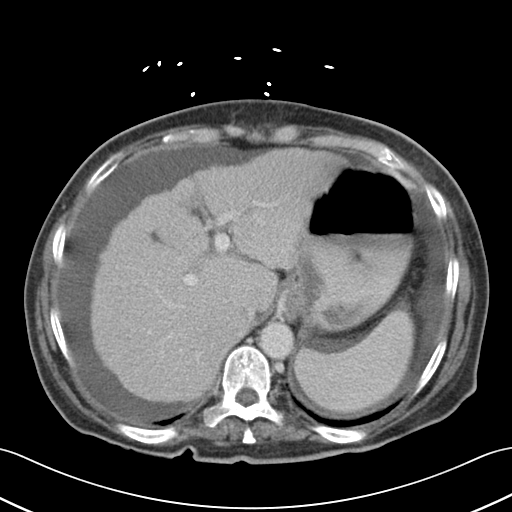
[im 82/104  lung]
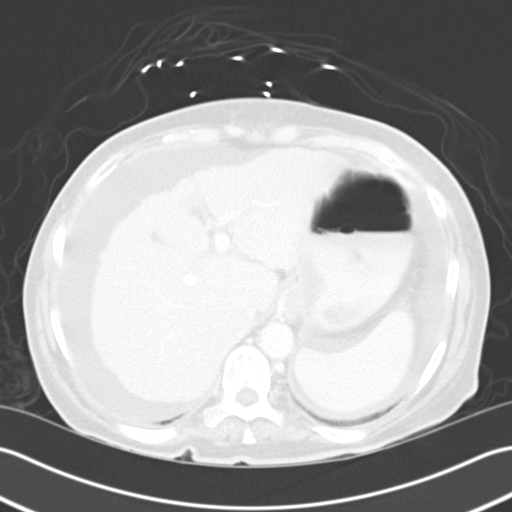
[im 87/104  soft-tissue]
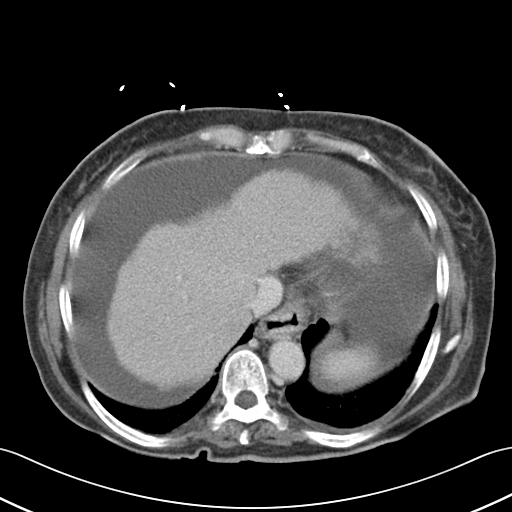
[im 87/104  lung]
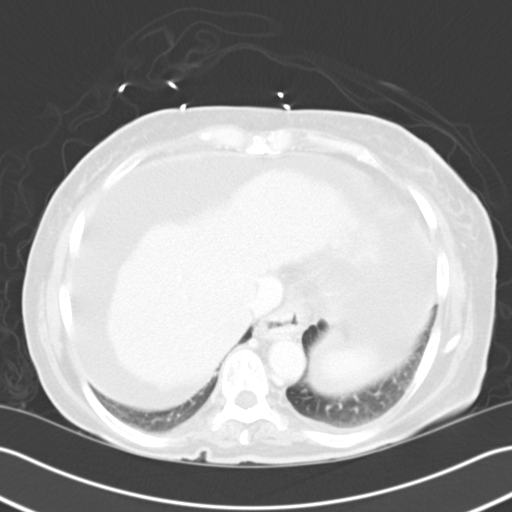
[im 93/104  lung]
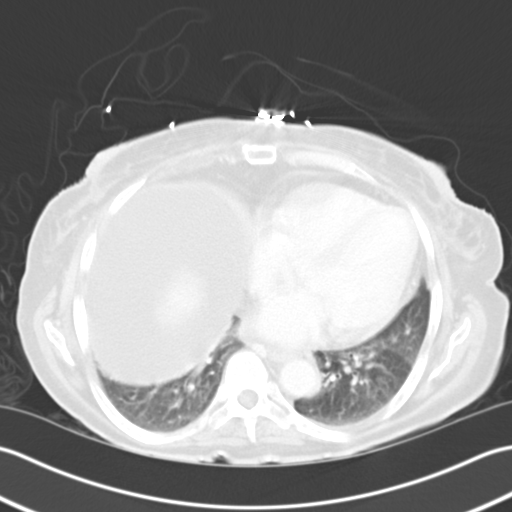
[im 98/104  soft-tissue]
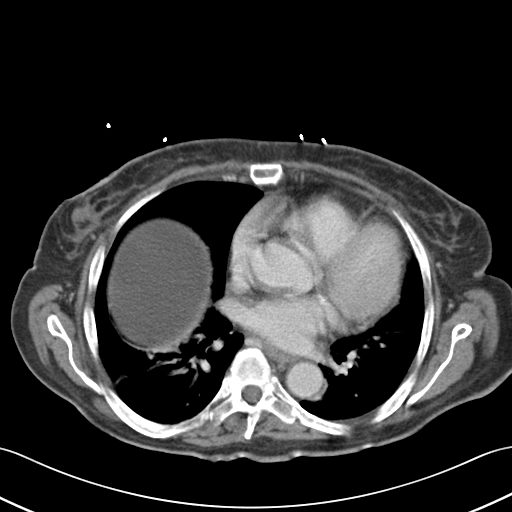
[im 98/104  lung]
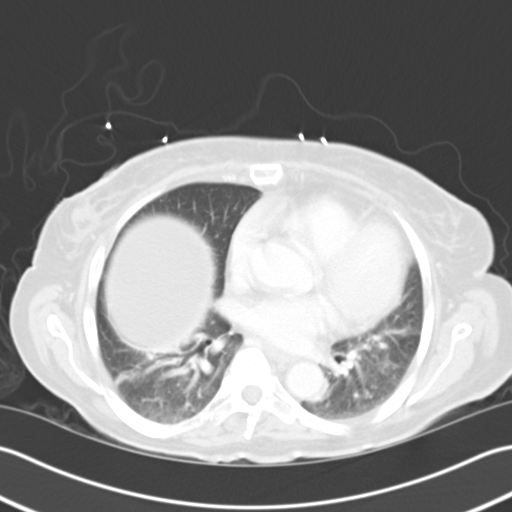

[13 of 32 positions shown; findings below may reference images not displayed]

FINDINGS: Mild bibasilar atelectasis is noted.

Moderate ascites is noted within the abdomen and pelvis.

The diffusely nodular contour of the liver is compatible with
hepatic cirrhosis. Scattered splenic, gastric and esophageal varices
are noted. The spleen is unremarkable in appearance. The patient is
status post cholecystectomy, with clips noted at the gallbladder
fossa. The pancreas and adrenal glands are unremarkable.

A 1.1 cm stone is noted at the lower pole of the right kidney. The
kidneys are otherwise unremarkable. There is no evidence of
hydronephrosis. No obstructing ureteral stones are seen. Mild
nonspecific perinephric stranding is noted.

There is distention of the jejunum and proximal ileum, with
suggestion of mild wall thickening. This measures up to 5.1 cm in
diameter. No transition point is seen. This is concerning for
dysmotility due to an infectious or inflammatory process. The
stomach is within normal limits. No acute vascular abnormalities are
seen. Mild calcification is noted along the abdominal aorta and its
branches.

The appendix is normal in caliber, without evidence of appendicitis.
The colon is unremarkable in appearance.

The bladder is mildly distended and grossly unremarkable. The
patient is status post hysterectomy. No suspicious adnexal masses
are seen. No inguinal lymphadenopathy is seen.

No acute osseous abnormalities are identified. Mild facet disease is
noted at the lower lumbar spine.
IMPRESSION: 1. Distention of the jejunum and proximal ileum, with suggestion of
mild wall thickening. This measures up to 5.1 cm in diameter,
without evidence of a transition point. This is concerning for
dysmotility due to an infectious or inflammatory process.
2. Moderate ascites noted within the abdomen and pelvis.
3. Findings of hepatic cirrhosis, with scattered splenic, gastric
and esophageal varices.
4. Large right renal stone noted.  Kidneys otherwise unremarkable.
5. Mild bibasilar atelectasis noted.
6. Mild calcification along the abdominal aorta and its branches.

## 2017-08-27 IMAGING — MR MR HEAD W/O CM
9 of 10 series · 35 of 48 positions shown · non-contrast
Comparison: Head CT without contrast 0668 hours today and earlier.
Brain MRI 05/20/2015 and earlier.

CLINICAL DATA: 59-year-old female with dizziness for 4 days, fall 2
days ago with head injury. Worsening symptoms. Initial encounter.

EXAM:
MRI HEAD WITHOUT CONTRAST
TECHNIQUE: Multiplanar, multiecho pulse sequences of the brain and surrounding
structures were obtained without intravenous contrast.

[Series 3: DWI · axial · 3.0mm · 1.09mm/px · z∈[-58,+79]mm · 8 of 94 slices shown (1 of 4)]
[im 1/94]
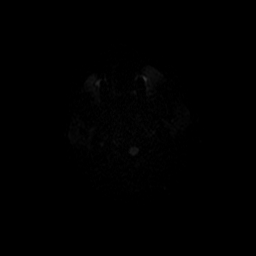
[im 11/94]
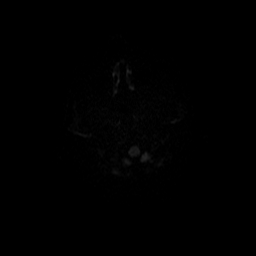
[im 32/94]
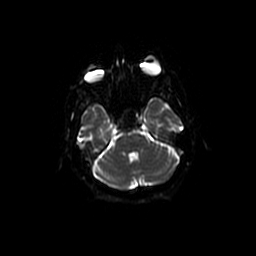
[im 42/94]
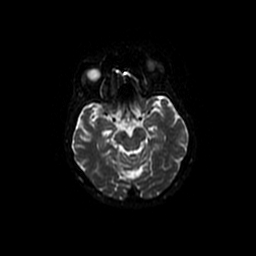
[im 52/94]
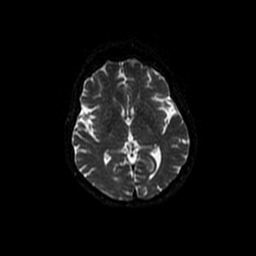
[im 63/94]
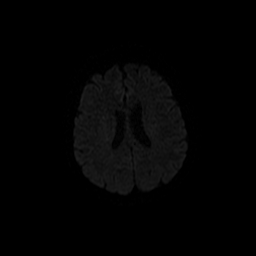
[im 83/94]
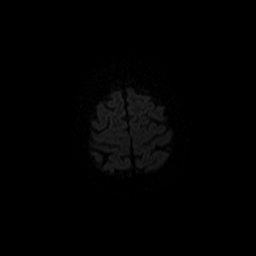
[im 94/94]
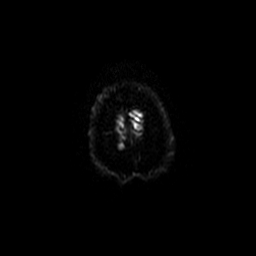

[Series 4: DWI · coronal · 5.0mm · 1.09mm/px · 7 of 66 slices shown (2 of 4)]
[im 1/66]
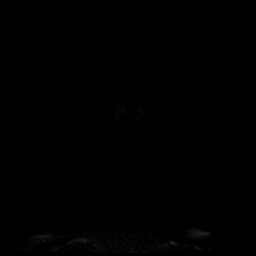
[im 11/66]
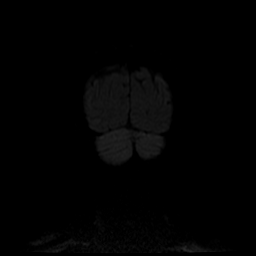
[im 22/66]
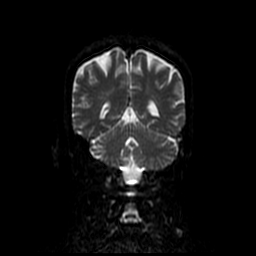
[im 33/66]
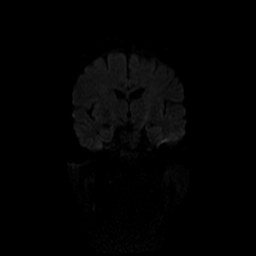
[im 44/66]
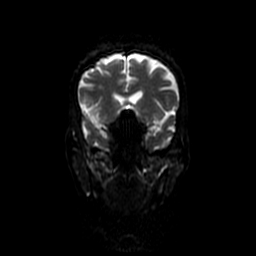
[im 55/66]
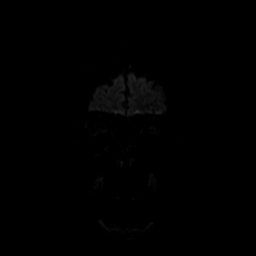
[im 66/66]
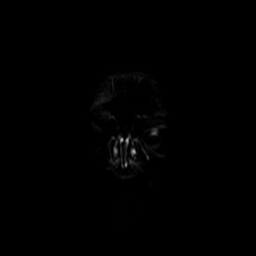

[Series 5: FLAIR · axial · 5.0mm · 0.43mm/px · z∈[-62,+74]mm · 3 of 24 slices shown]
[im 1/24]
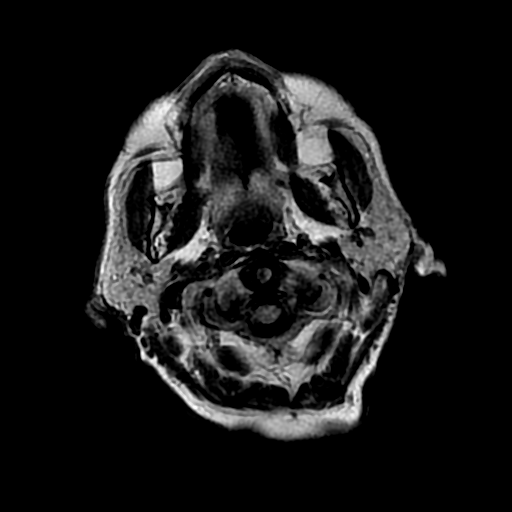
[im 12/24]
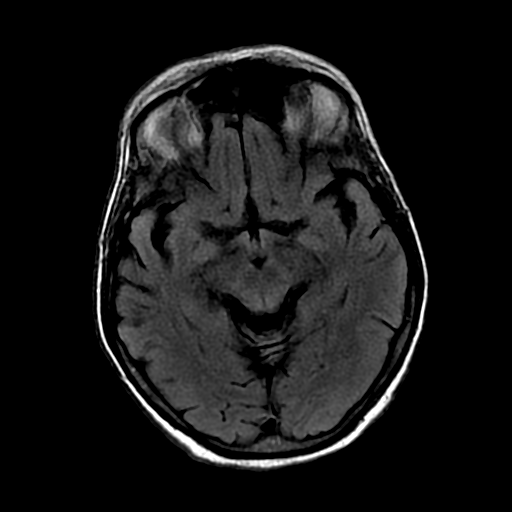
[im 24/24]
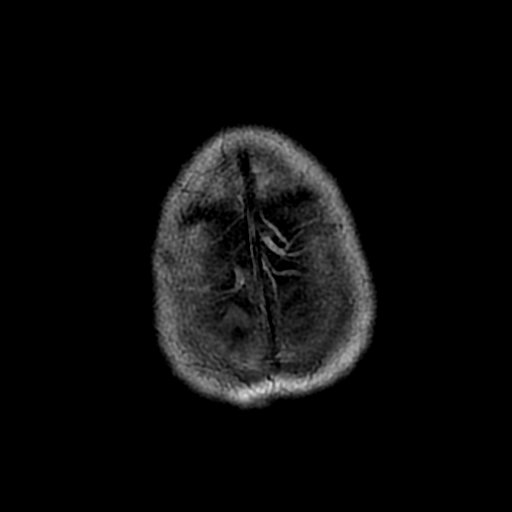

[Series 6: T1 · sagittal · 5.0mm · 0.47mm/px · 2 of 23 slices shown]
[im 1/23]
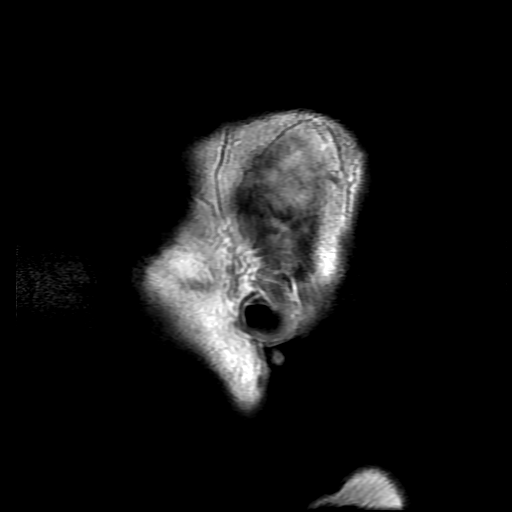
[im 23/23]
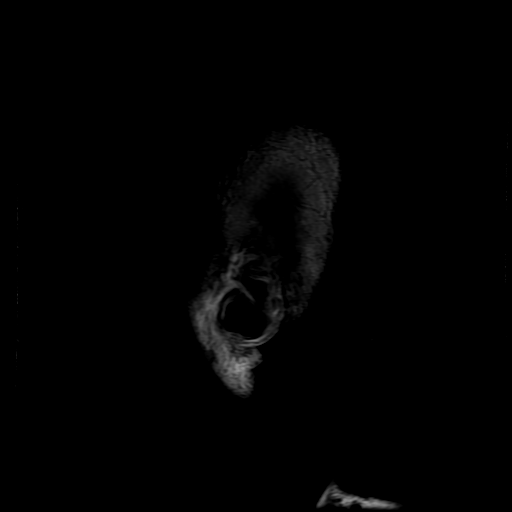

[Series 7: ax mpgr · axial · 5.0mm · 0.43mm/px · z∈[-62,+3]mm · 2 of 24 slices shown]
[im 1/24]
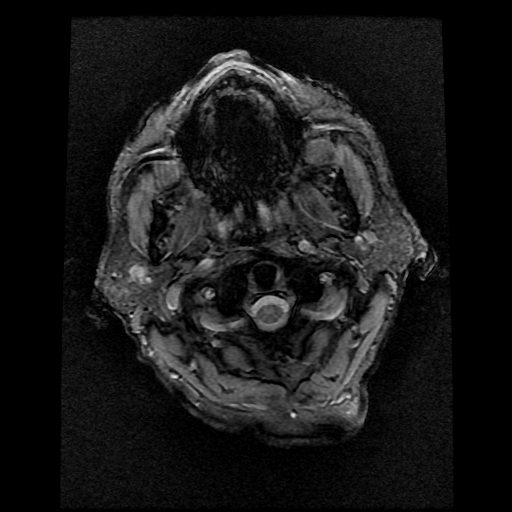
[im 12/24]
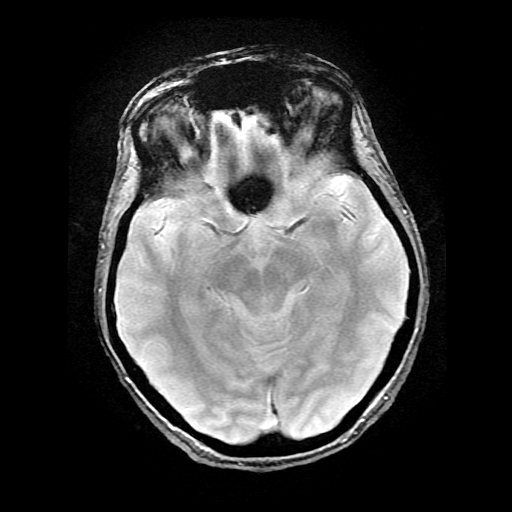

[Series 8: T2 · axial · 5.0mm · 0.43mm/px · z∈[-62,+69]mm · 2 of 21 slices shown (1 of 2)]
[im 1/21]
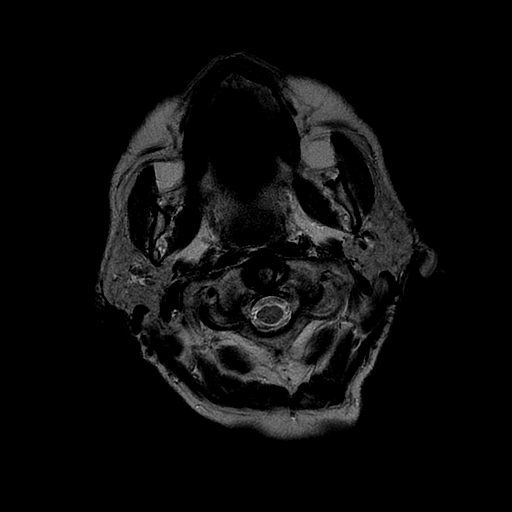
[im 21/21]
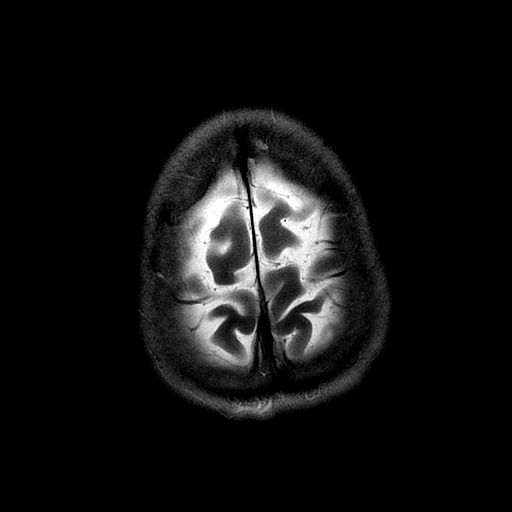

[Series 10: T2 · coronal · 5.0mm · 0.43mm/px · 3 of 28 slices shown (2 of 2)]
[im 1/28]
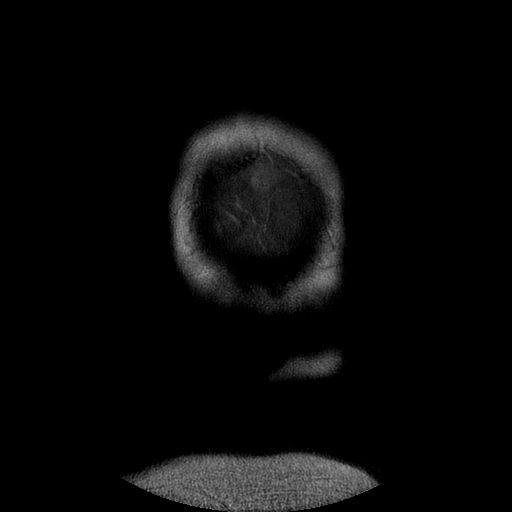
[im 14/28]
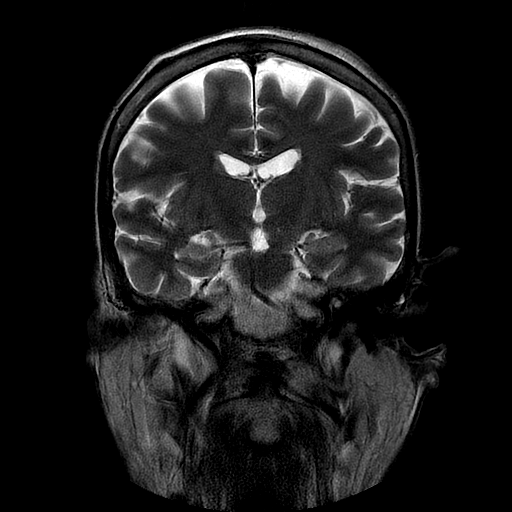
[im 28/28]
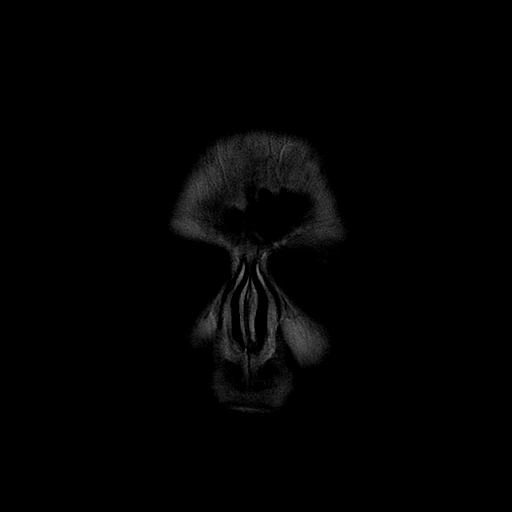

[Series 300: DWI · axial · 3.0mm · 1.09mm/px · z∈[-58,+79]mm · 5 of 47 slices shown (3 of 4)]
[im 1/47]
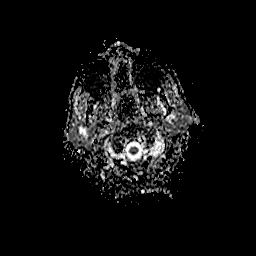
[im 12/47]
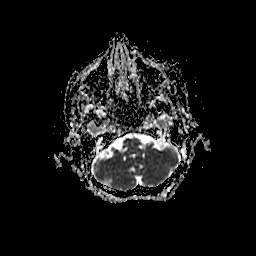
[im 24/47]
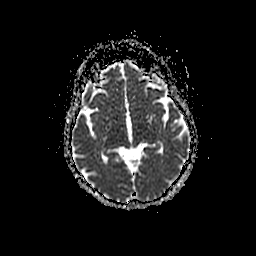
[im 35/47]
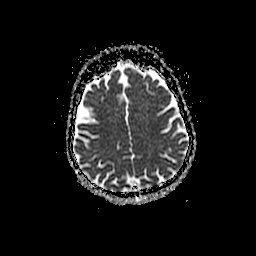
[im 47/47]
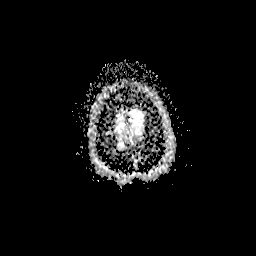

[Series 400: DWI · coronal · 5.0mm · 1.09mm/px · 3 of 33 slices shown (4 of 4)]
[im 1/33]
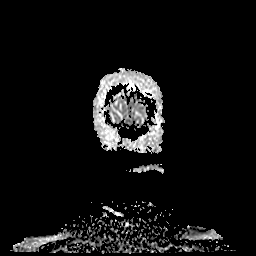
[im 17/33]
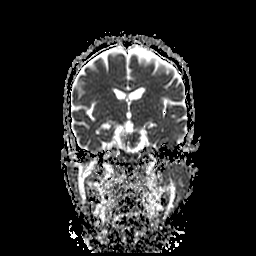
[im 33/33]
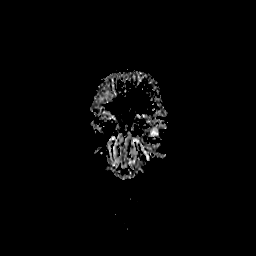

[35 of 48 positions shown; findings below may reference images not displayed]

FINDINGS: Major intracranial vascular flow voids are stable and within normal
limits. No restricted diffusion to suggest acute infarction. No
midline shift, mass effect, evidence of mass lesion,
ventriculomegaly, extra-axial collection or acute intracranial
hemorrhage. Cervicomedullary junction and pituitary are within
normal limits. Negative visualized cervical spine.

Stable gray and white matter signal throughout the brain. Scattered
mild for age nonspecific mostly subcortical white matter T2 and
FLAIR hyperintensity. No cortical encephalomalacia or chronic
cerebral blood products.

Visible internal auditory structures appear normal. Trace mastoid
fluid is unchanged. Negative nasopharynx. Trace ethmoid sinus
mucosal thickening is unchanged. Orbit and scalp soft tissues appear
stable and within normal limits. Normal bone marrow signal.
IMPRESSION: 1.  No acute intracranial abnormality.
2. Stable noncontrast MRI appearance of the brain since [REDACTED].
Mild for age nonspecific cerebral white matter signal changes most
commonly due to chronic small vessel disease.
3. Stable mild mastoid effusions, most often postinflammatory.

## 2017-08-27 IMAGING — CT CT HEAD W/O CM
2 series · 16 of 30 positions shown, 20 images · non-contrast
Comparison: 07/06/2015

CLINICAL DATA: Dizziness for 4 days, fell 2 days ago, RIGHT
parietal headache which worsened in route to hospital, slurred
speech, limb ataxia, LEFT facial droop, stroke symptoms, history
diabetes mellitus, TIA, hypertension, paroxysmal atrial
fibrillation, uterine cancer, stroke

EXAM:
CT HEAD WITHOUT CONTRAST
TECHNIQUE: Contiguous axial images were obtained from the base of the skull
through the vertex without intravenous contrast.

[Series 201: head w/o, idose (1) · axial · non-contrast · 0.49mm/px · z∈[+1262,+1392]mm · 13 of 32 slices shown, 17 images]
[im 3/32  brain]
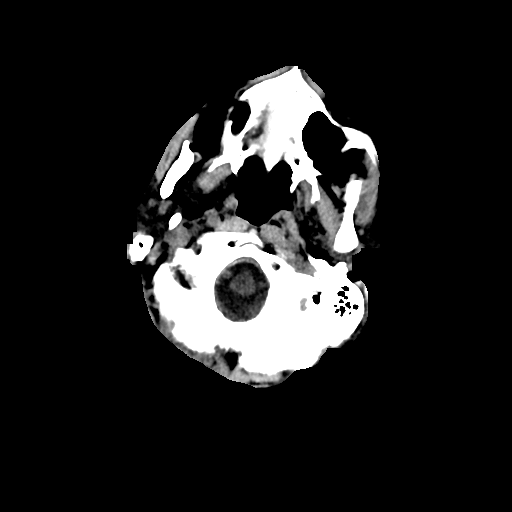
[im 3/32  bone]
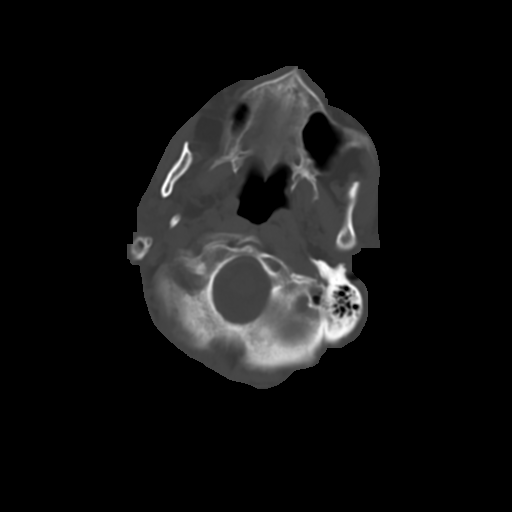
[im 5/32  brain]
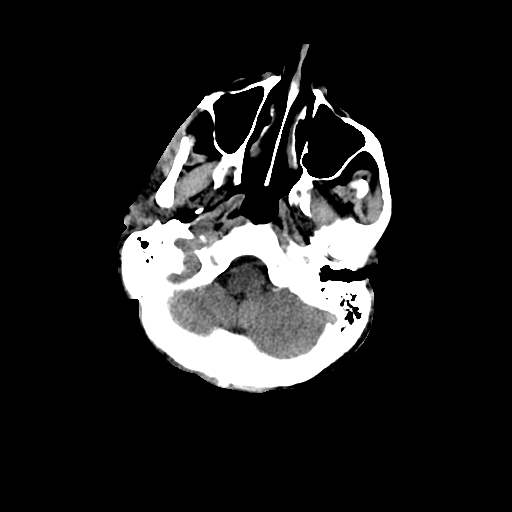
[im 7/32  brain]
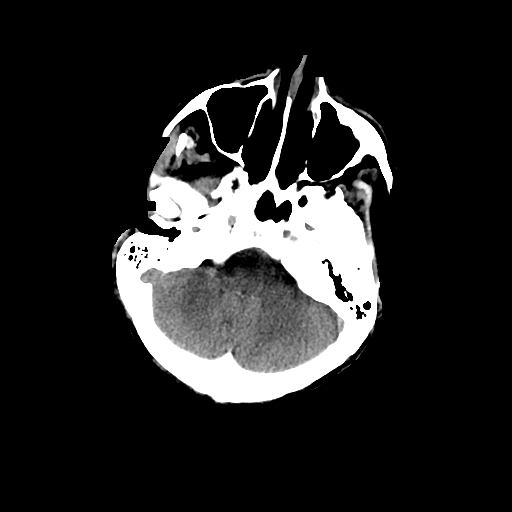
[im 9/32  brain]
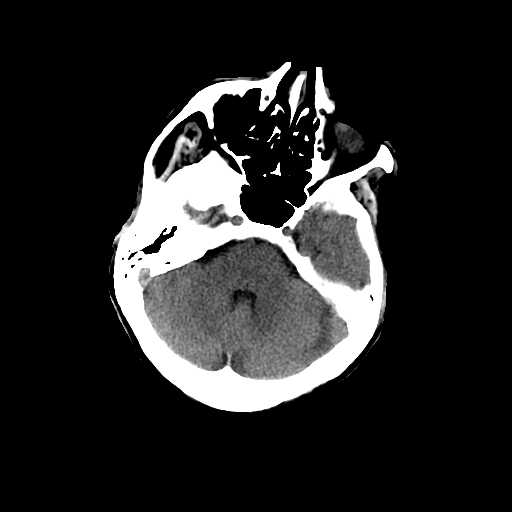
[im 12/32  brain]
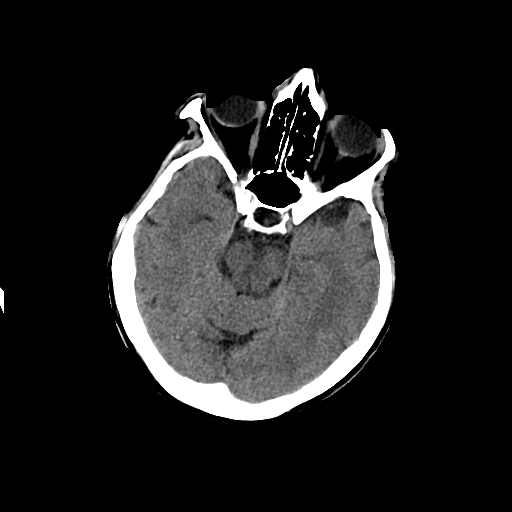
[im 12/32  bone]
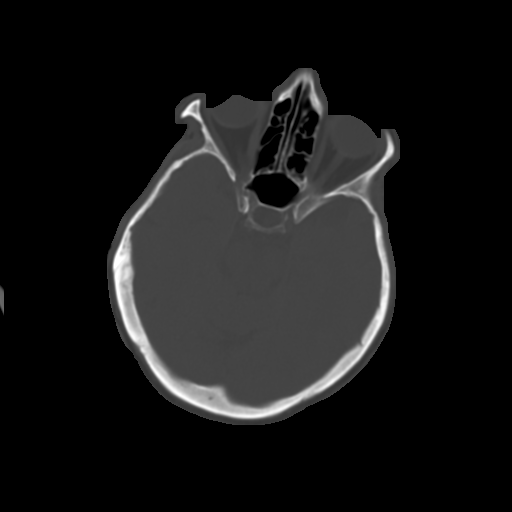
[im 14/32  brain]
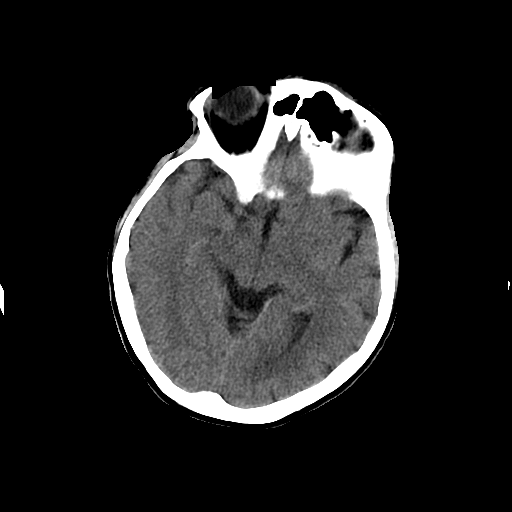
[im 16/32  brain]
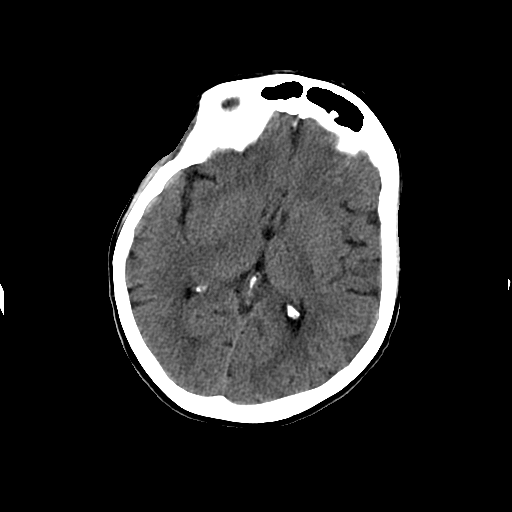
[im 18/32  brain]
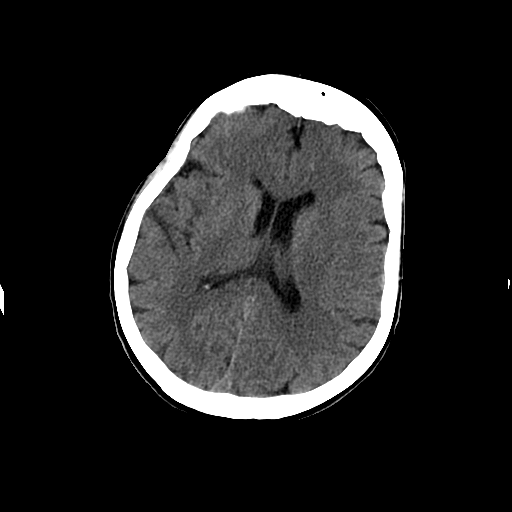
[im 20/32  brain]
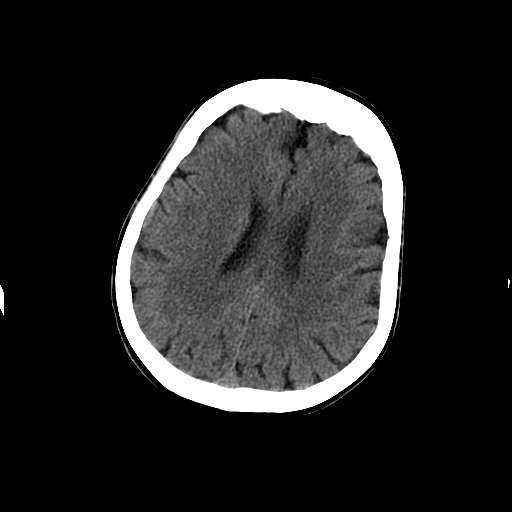
[im 20/32  bone]
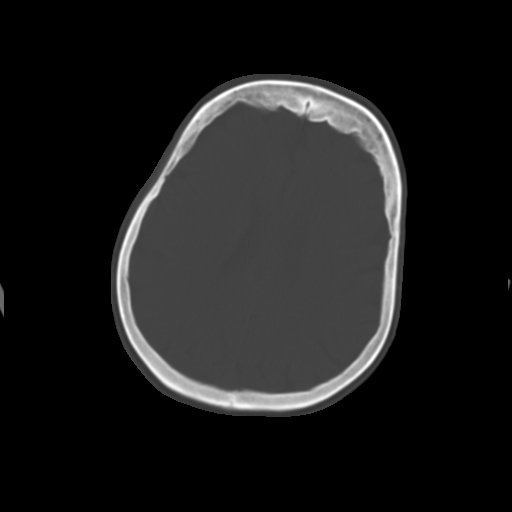
[im 23/32  brain]
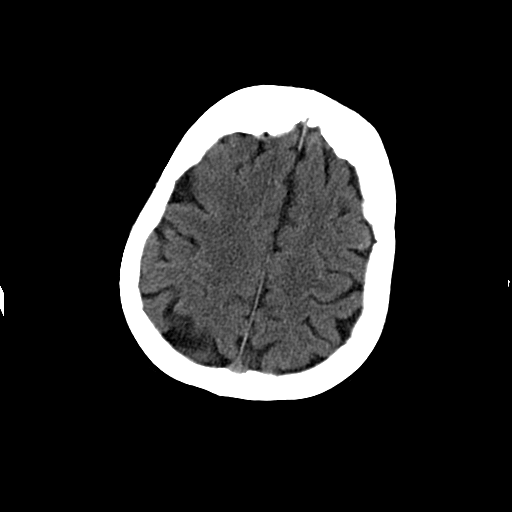
[im 25/32  brain]
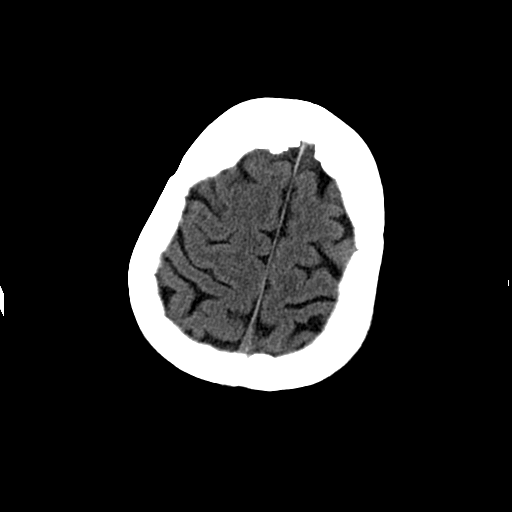
[im 27/32  brain]
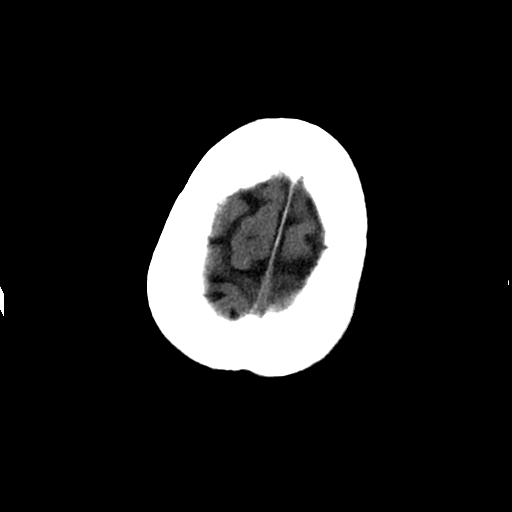
[im 29/32  brain]
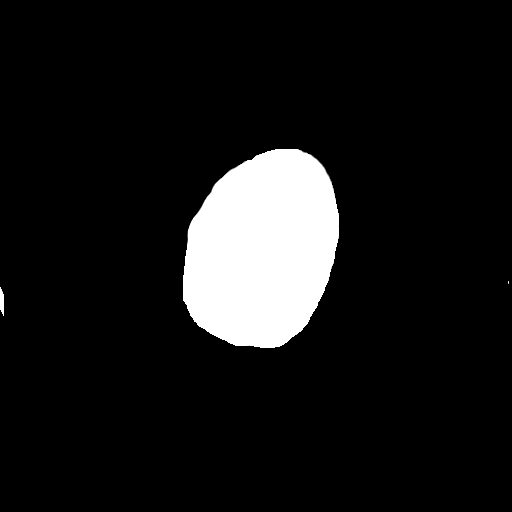
[im 29/32  bone]
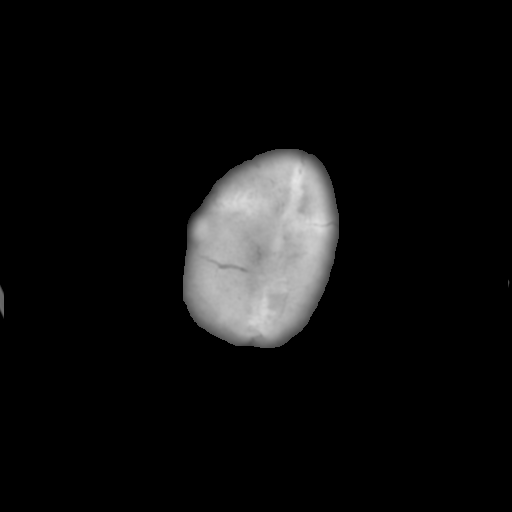

[Series 202: head w/o bone, idose (1) · axial · non-contrast · 0.49mm/px · z∈[+1262,+1307]mm · 3 of 32 slices shown]
[im 3/32  bone]
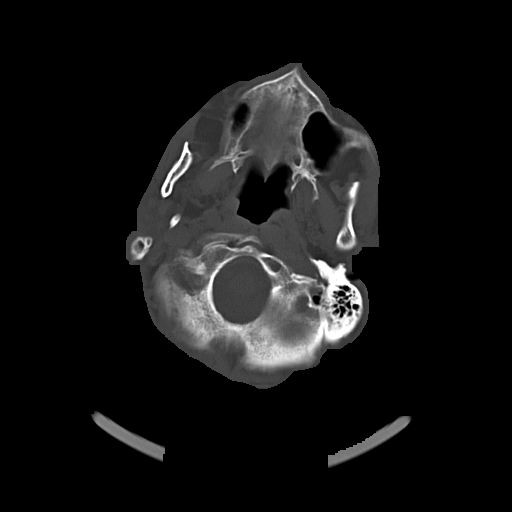
[im 7/32  bone]
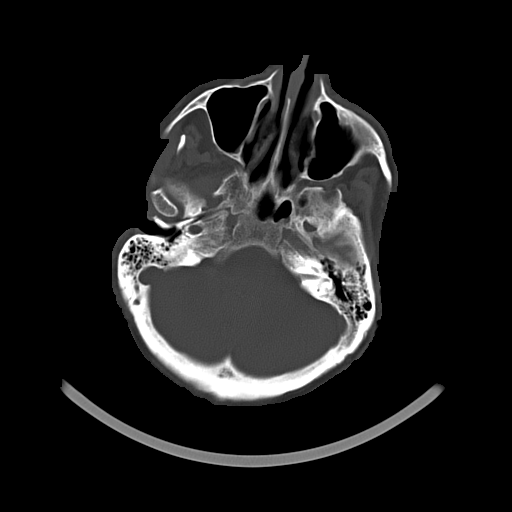
[im 12/32  bone]
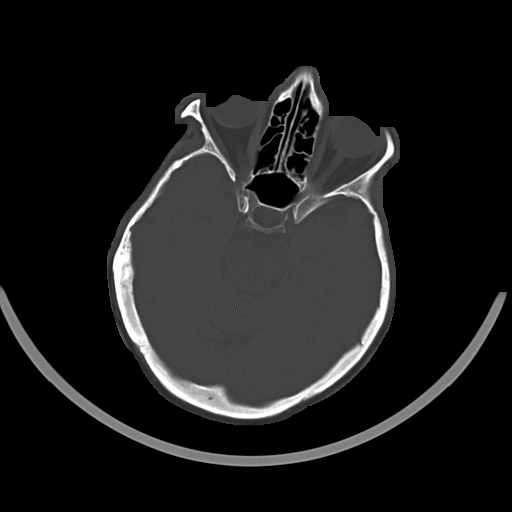

[16 of 30 positions shown; findings below may reference images not displayed]

FINDINGS: Minimal age-related atrophy.

Normal ventricular morphology.

No midline shift or mass effect.

Otherwise normal appearance of brain parenchyma.

No intracranial hemorrhage, mass lesion or evidence acute
infarction.

No extra-axial fluid collections.

Sinuses clear and bones unremarkable.
IMPRESSION: No acute intracranial abnormalities.

## 2017-08-27 IMAGING — MR MR CERVICAL SPINE W/O CM
4 of 5 series · 19 of 48 positions shown · non-contrast
Comparison: 10/04/2015 brain MR. 05/27/2015 cervical spine CT.

CLINICAL DATA: 59-year-old female with onset of dizziness 4 days
ago and fall 2 days ago hitting head. Altered speech. Left-sided
weakness. Subsequent encounter.

EXAM:
MRI CERVICAL SPINE WITHOUT CONTRAST
TECHNIQUE: Multiplanar, multisequence MR imaging of the cervical spine was
performed. No intravenous contrast was administered.

[Series 2: T2 · sagittal · 3.0mm · 0.43mm/px · 5 of 13 slices shown (1 of 2)]
[im 1/13]
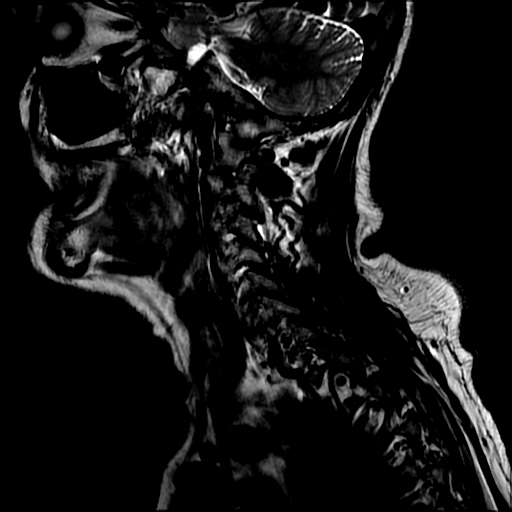
[im 4/13]
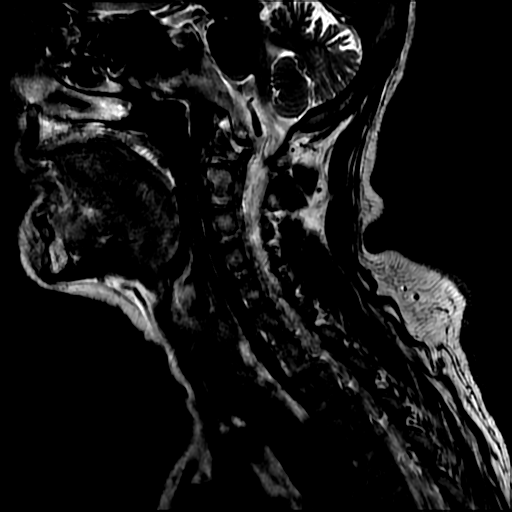
[im 7/13]
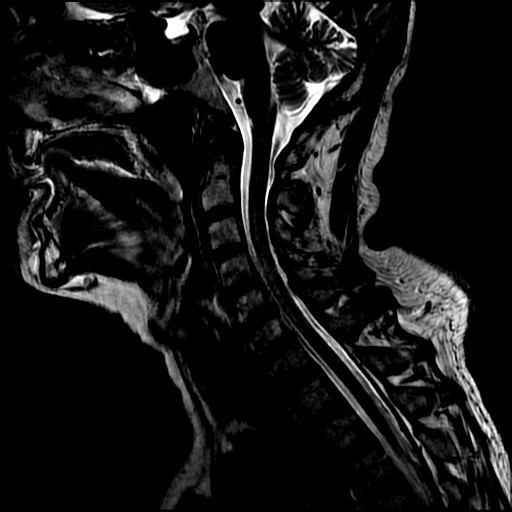
[im 10/13]
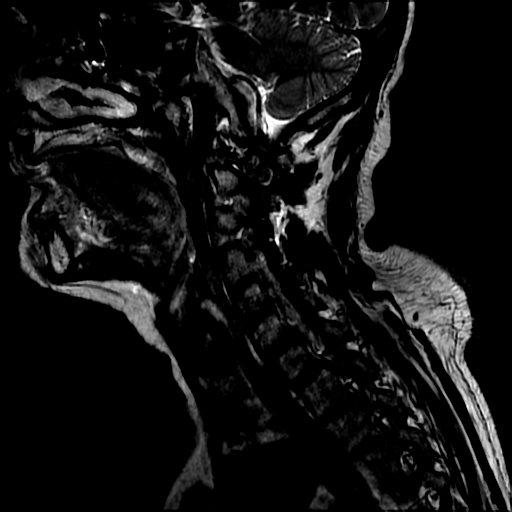
[im 13/13]
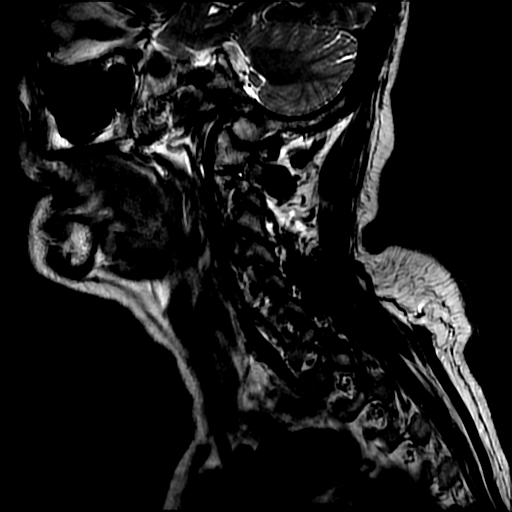

[Series 3: FLAIR · sagittal · 3.0mm · 0.43mm/px · 3 of 13 slices shown]
[im 3/13]
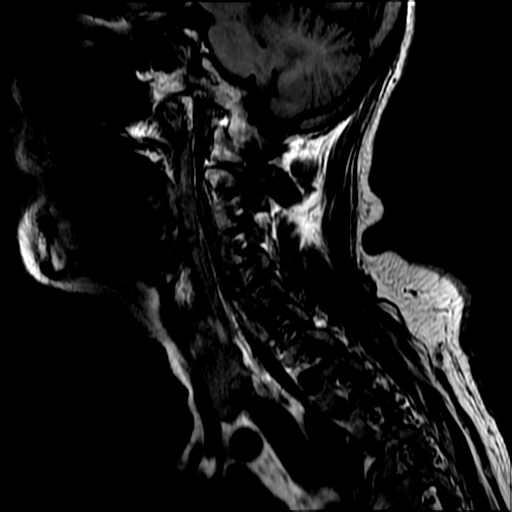
[im 8/13]
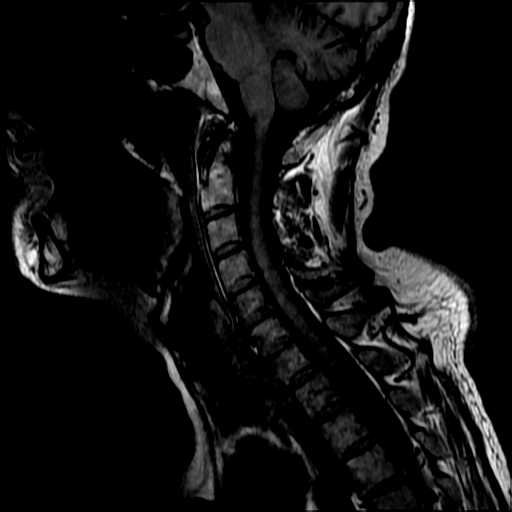
[im 13/13]
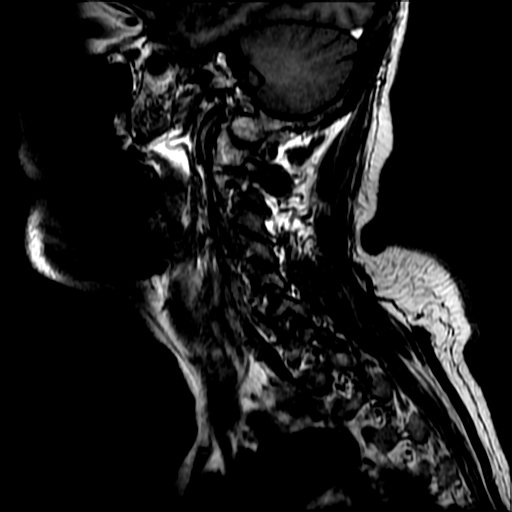

[Series 4: STIR · sagittal · 3.0mm · 0.43mm/px · 3 of 13 slices shown]
[im 3/13]
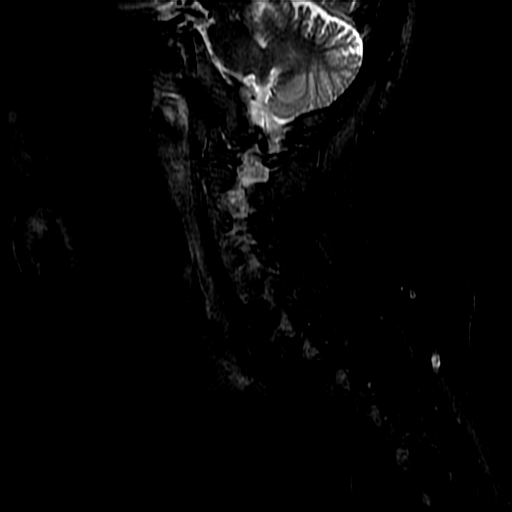
[im 8/13]
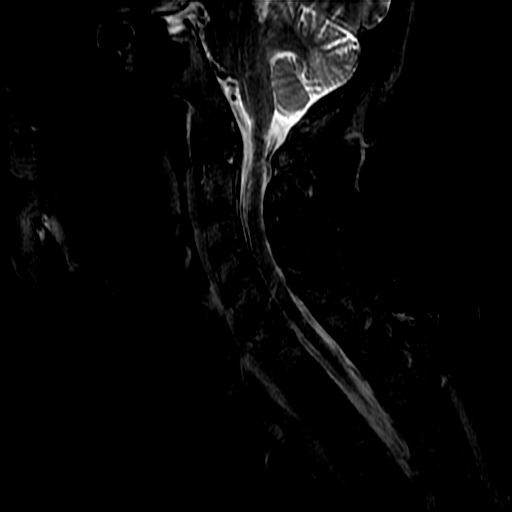
[im 13/13]
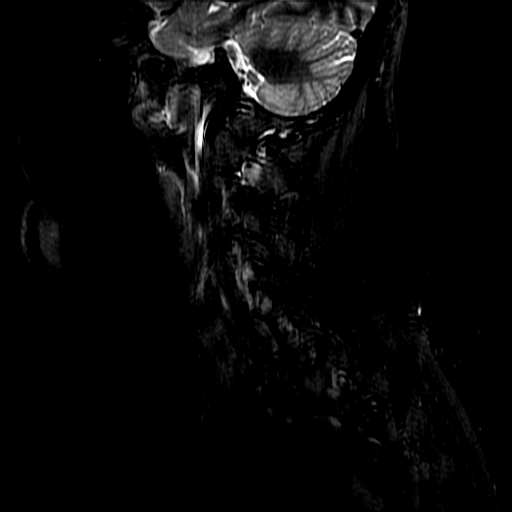

[Series 6: T2 · axial · 3.0mm · 0.35mm/px · z∈[-157,-61]mm · 8 of 31 slices shown (2 of 2)]
[im 1/31]
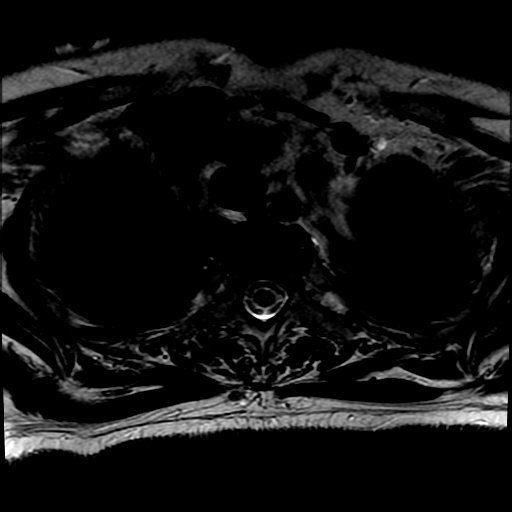
[im 5/31]
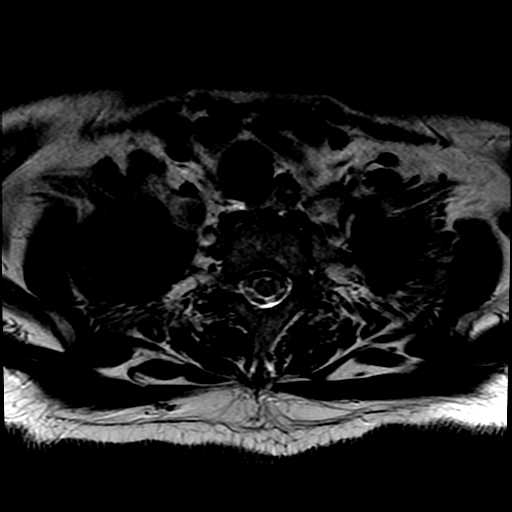
[im 10/31]
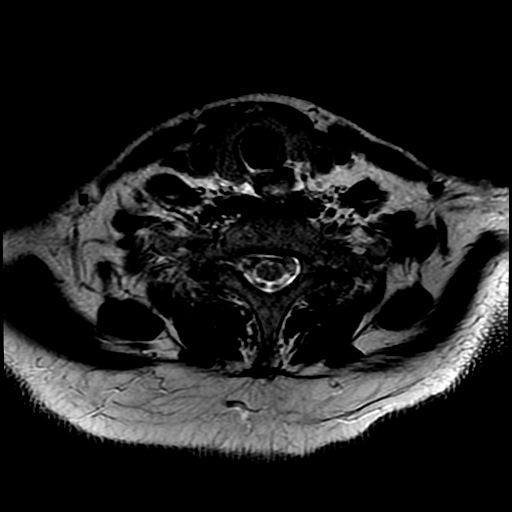
[im 14/31]
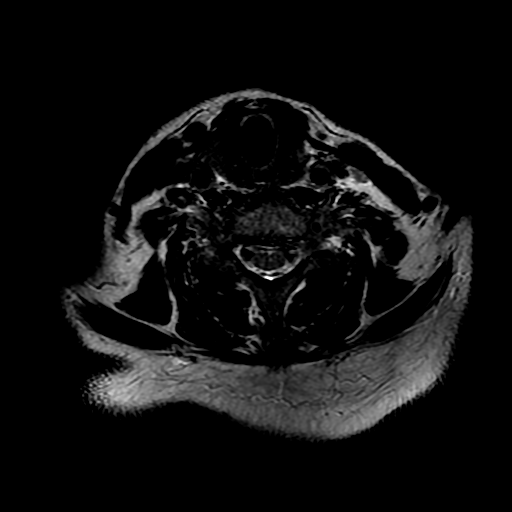
[im 17/31]
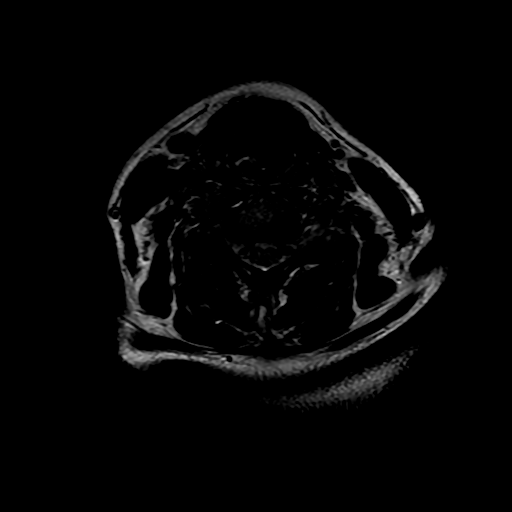
[im 21/31]
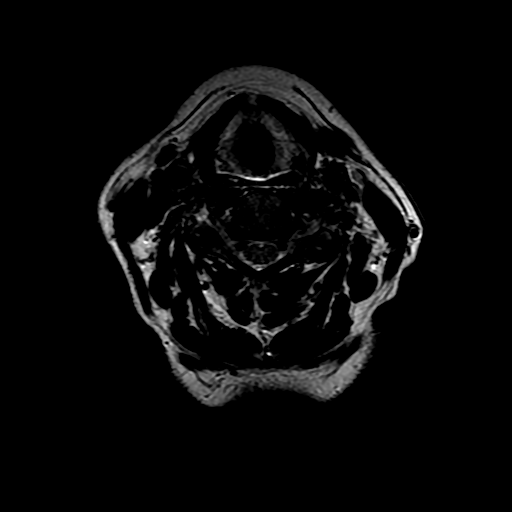
[im 26/31]
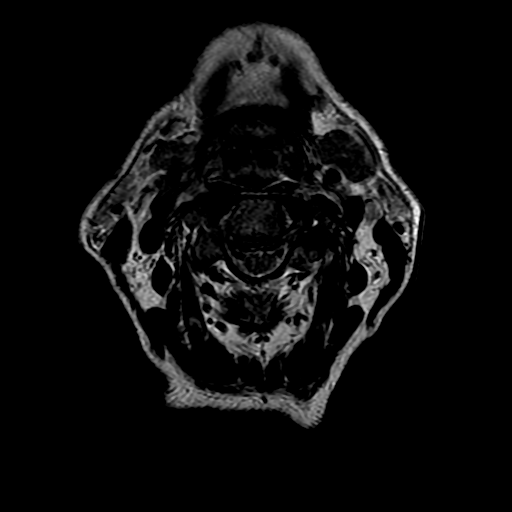
[im 31/31]
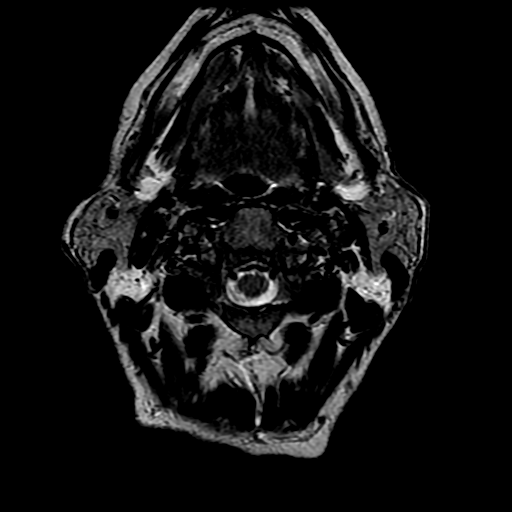

[19 of 48 positions shown; findings below may reference images not displayed]

FINDINGS: Exam is motion degraded.

Cervical medullary junction and visualized intracranial structures
unremarkable. No obvious focal cervical cord signal abnormality.

Mild edema within the right C3 facet (series 4, images 1 and 2)
raises possibility of bone bruise or subtle fracture versus
degenerative changes. No other findings of osseous or soft tissue
edema to suggest acute injury.

C2-3:  Negative.

C3-4: Negative.

C4-5:  Minimal bulge.

C5-6: Minimal to mild bulge. Slight narrowing ventral thecal sac
greater on the right. Mild right foraminal narrowing.

C6-7:  Minimal bulge.

C7-T1:  Negative.
IMPRESSION: Exam is motion degraded.

Mild edema within the right C3 facet raises possibility of bone
bruise or subtle fracture (given history) versus degenerative
changes. No other findings of osseous or soft tissue edema to
suggest acute injury.

Minimal to mild degenerative changes most notable C5-6 level as
detailed above.

## 2017-08-28 IMAGING — CR DG CHEST 2V
2 series · 2 of 2 positions shown · non-contrast
Comparison: 10/04/2015

CLINICAL DATA: Chest pain.

EXAM:
CHEST  2 VIEW

[chest lat]
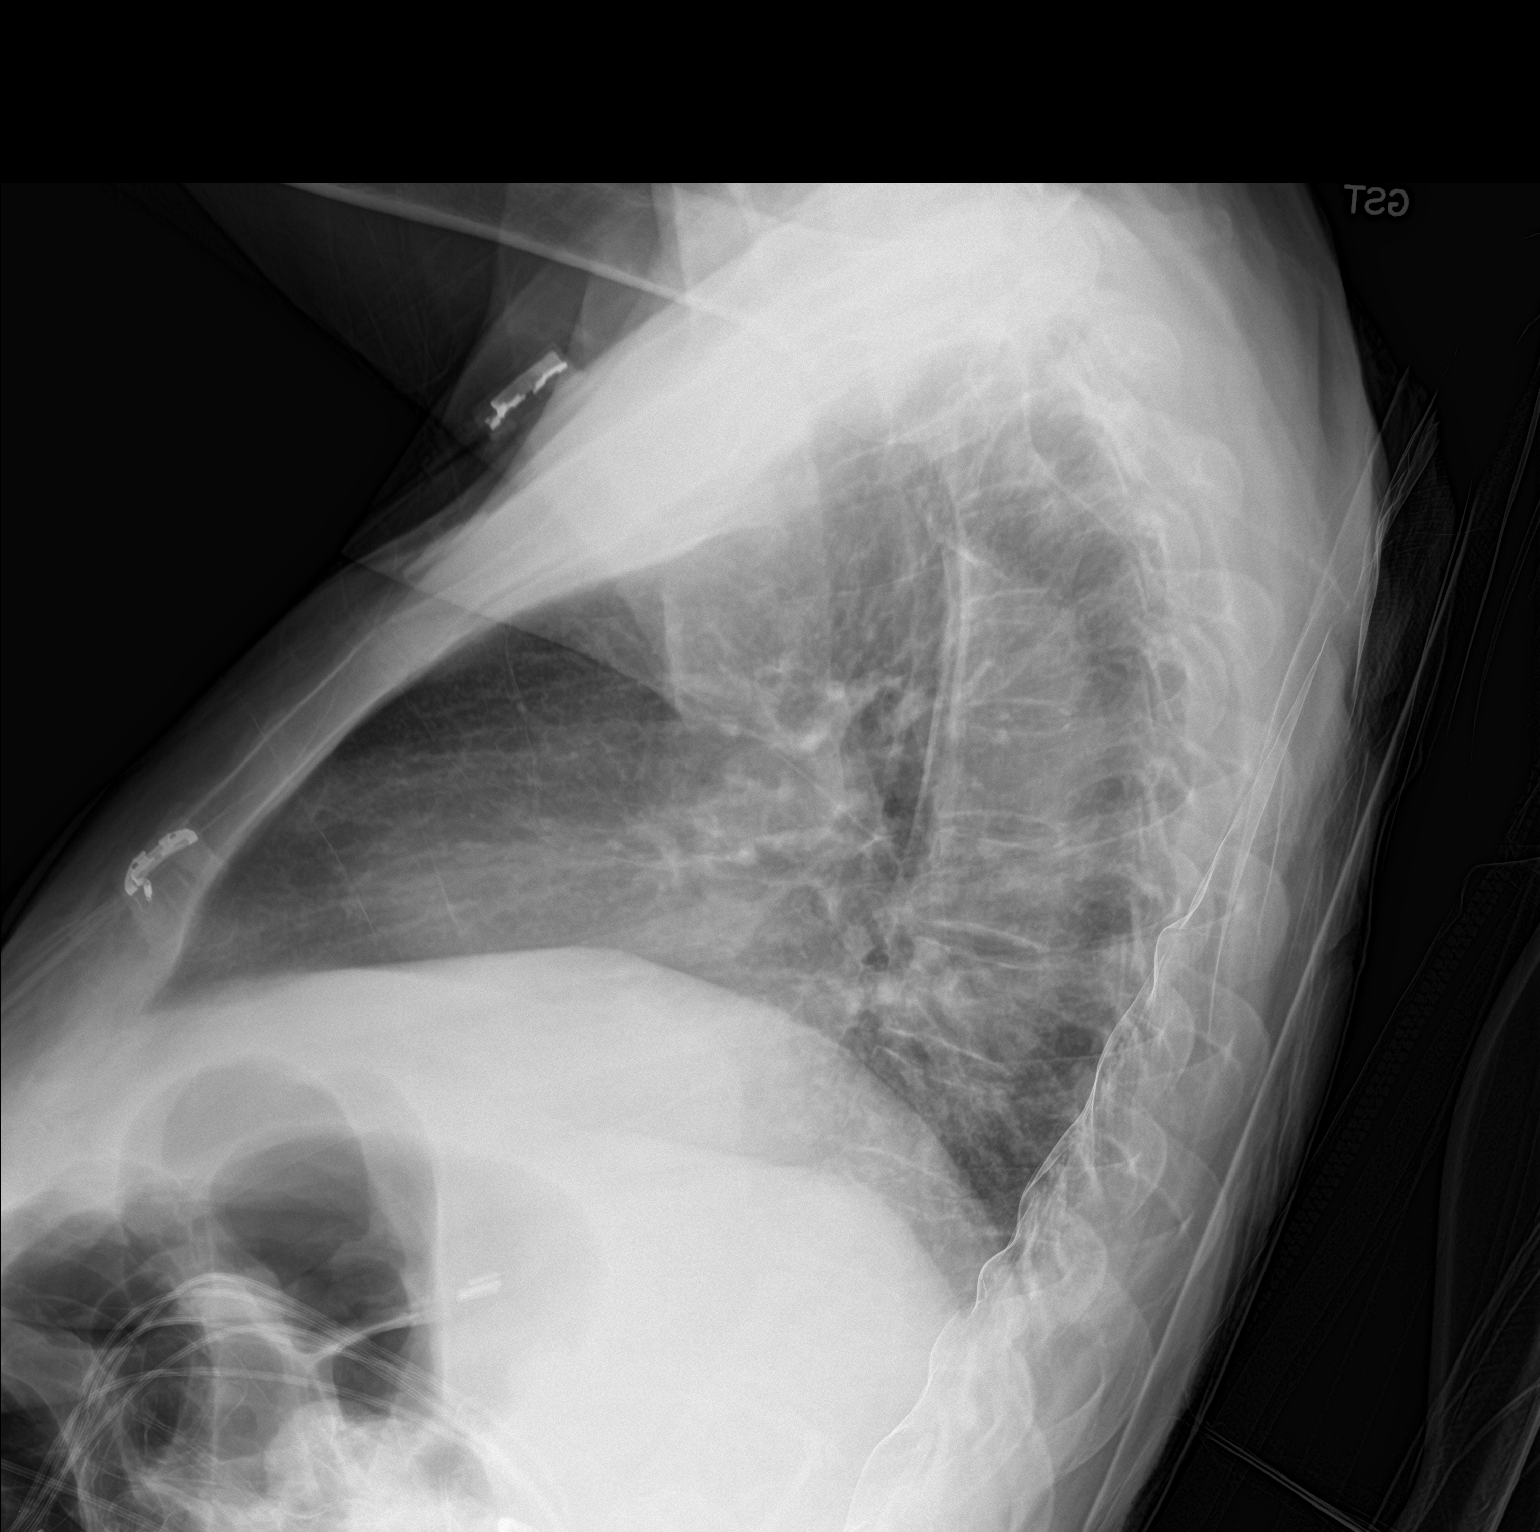

[chest ap]
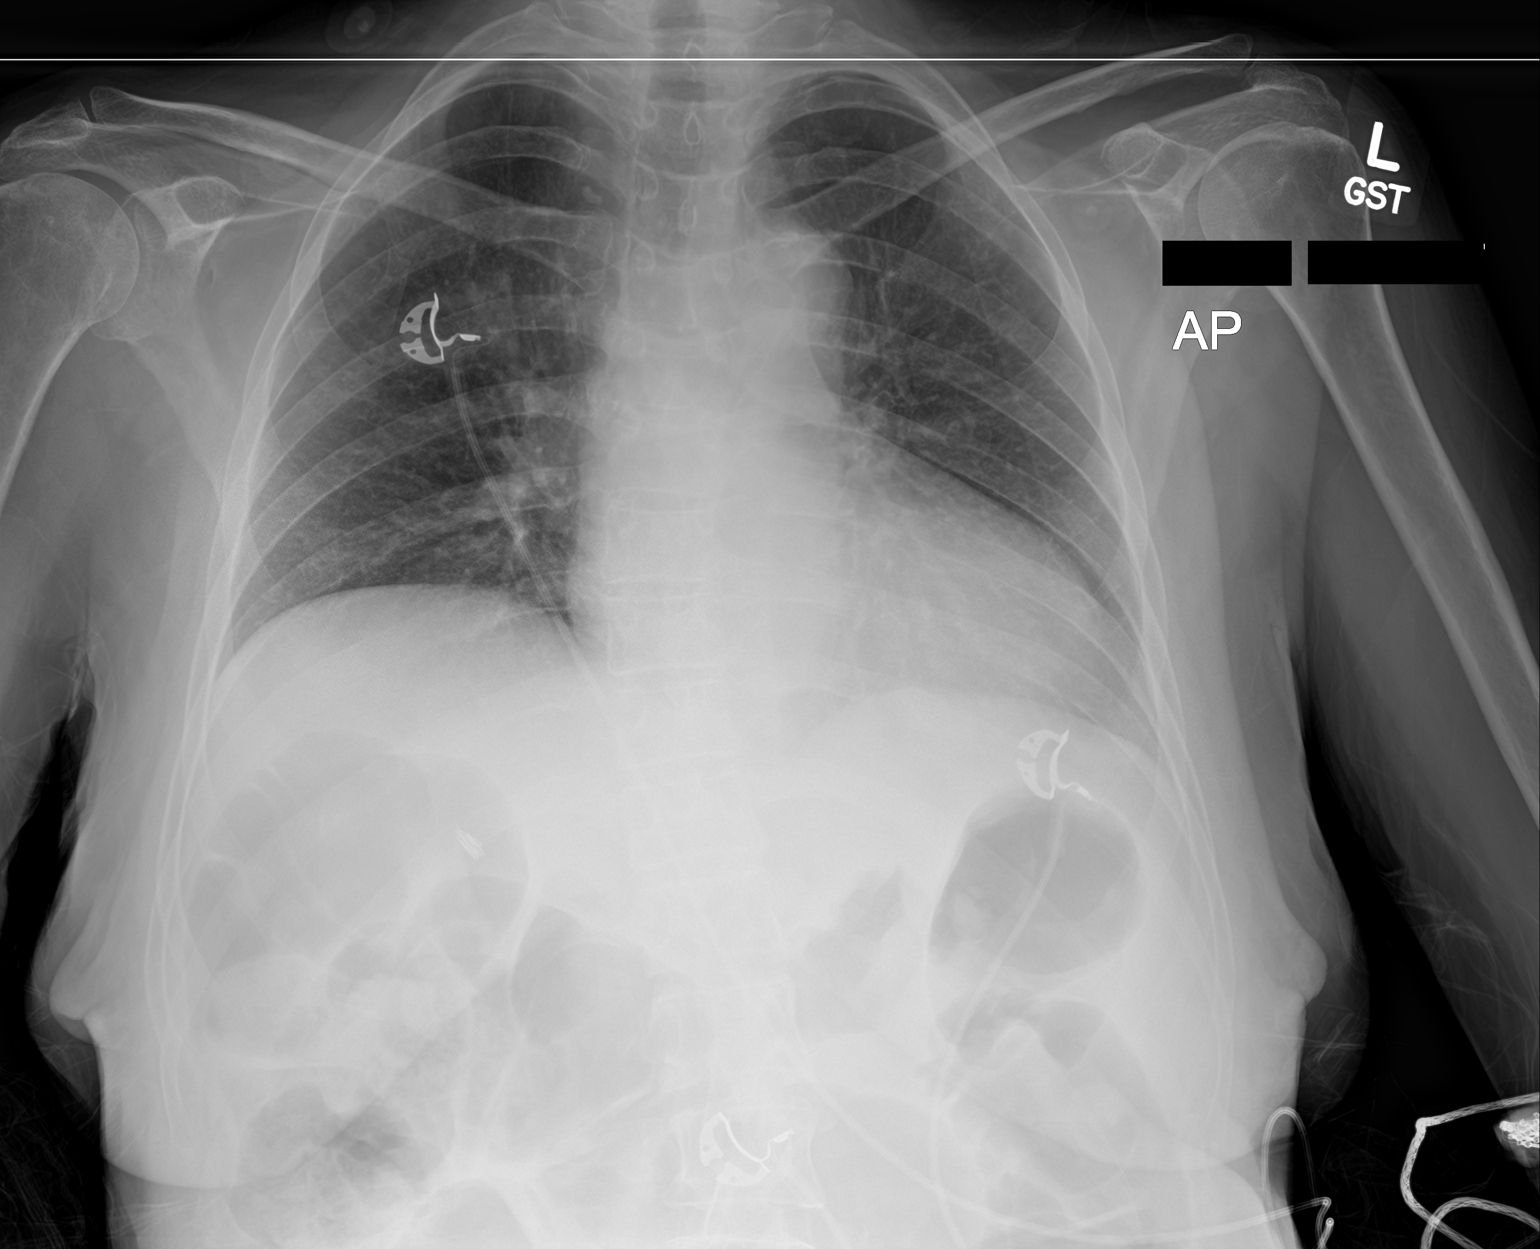

[2 of 2 positions shown; findings below may reference images not displayed]

FINDINGS: Shallow inspiration. The heart size and mediastinal contours are
within normal limits. Both lungs are clear. The visualized skeletal
structures are unremarkable. Surgical clips in the right upper
quadrant.
IMPRESSION: No active cardiopulmonary disease.

## 2017-09-06 IMAGING — CT CT ABD-PELV W/ CM
1 of 3 series · 13 of 32 positions shown, 18 images · IV contrast (omnipaque)
Comparison: CT abdomen pelvis 08/14/2015

CLINICAL DATA: Lumbar spine degenerative changes.

EXAM:
CT ABDOMEN AND PELVIS WITH CONTRAST
TECHNIQUE: Multidetector CT imaging of the abdomen and pelvis was performed
using the standard protocol following bolus administration of
intravenous contrast.
CONTRAST:  100mL OMNIPAQUE IOHEXOL 300 MG/ML  SOLN

[Series 2: routine abd pel with · axial · 0.74mm/px · z∈[-956,-506]mm · 13 of 102 slices shown, 18 images]
[im 6/102  soft-tissue]
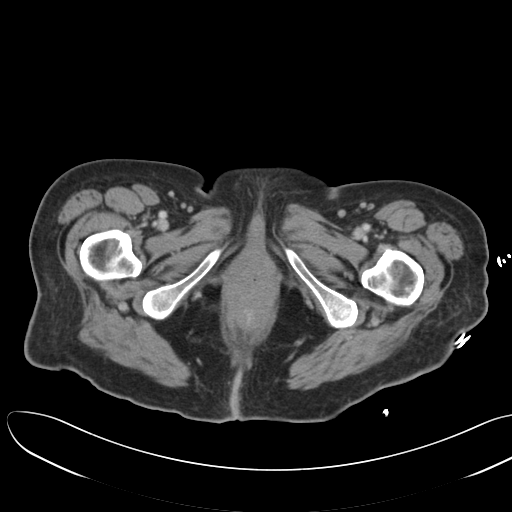
[im 6/102  bone]
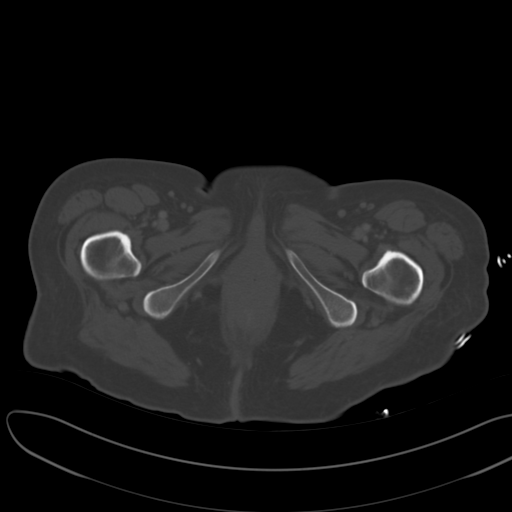
[im 16/102  soft-tissue]
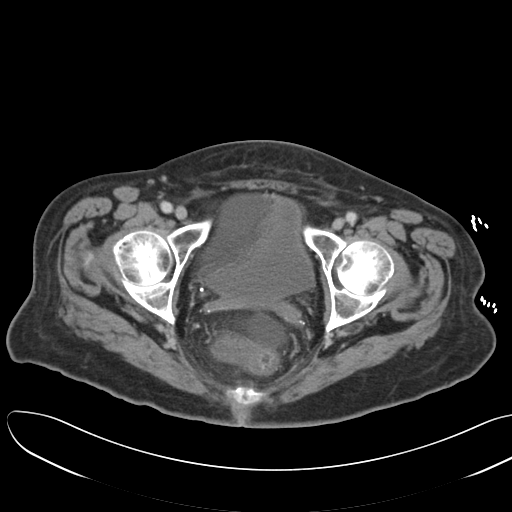
[im 22/102  soft-tissue]
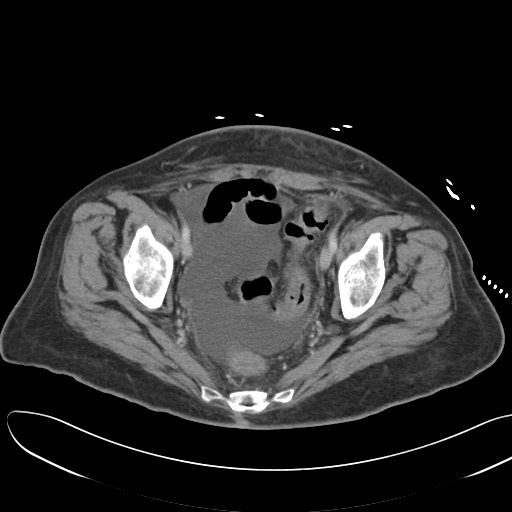
[im 32/102  soft-tissue]
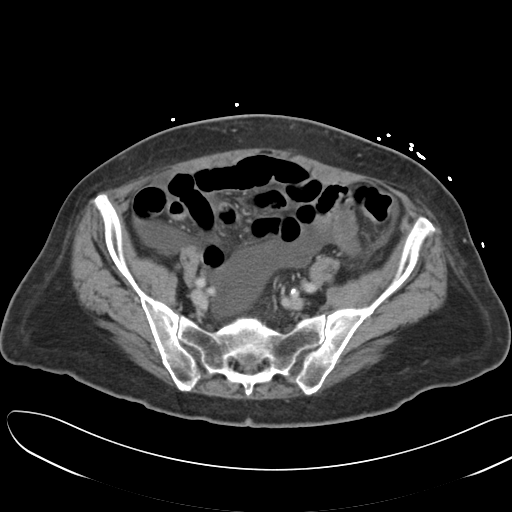
[im 38/102  soft-tissue]
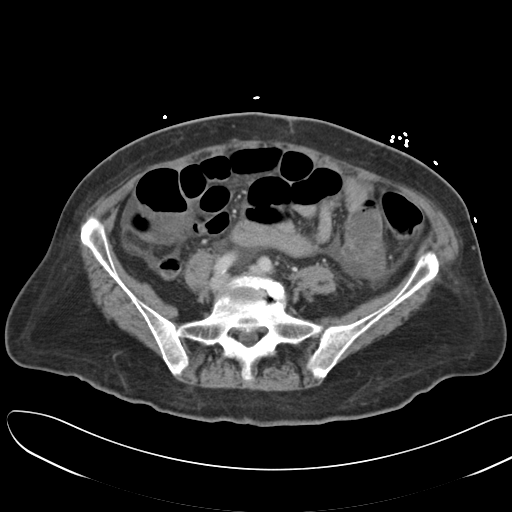
[im 48/102  soft-tissue]
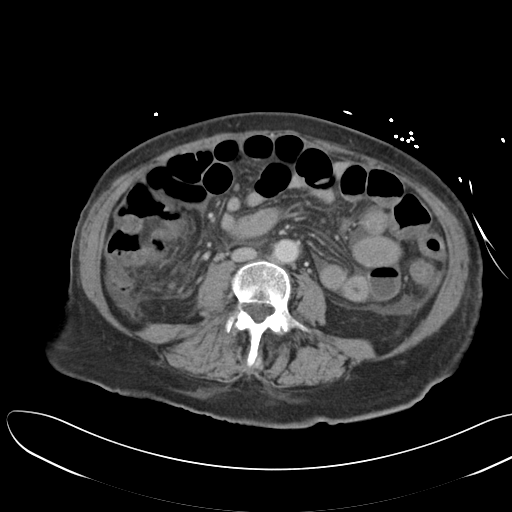
[im 54/102  soft-tissue]
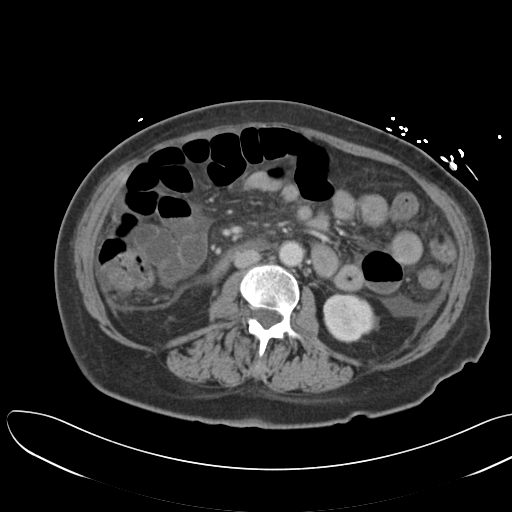
[im 64/102  soft-tissue]
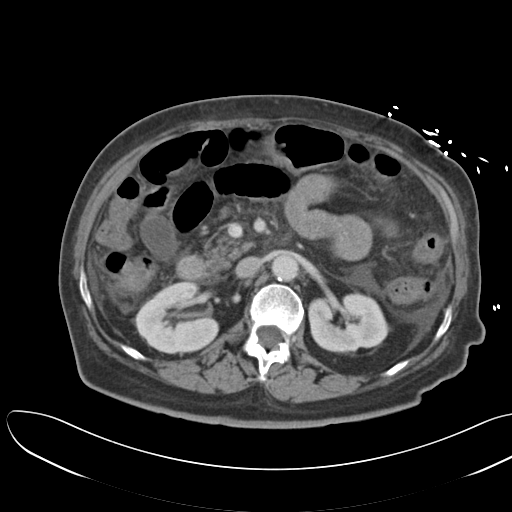
[im 70/102  soft-tissue]
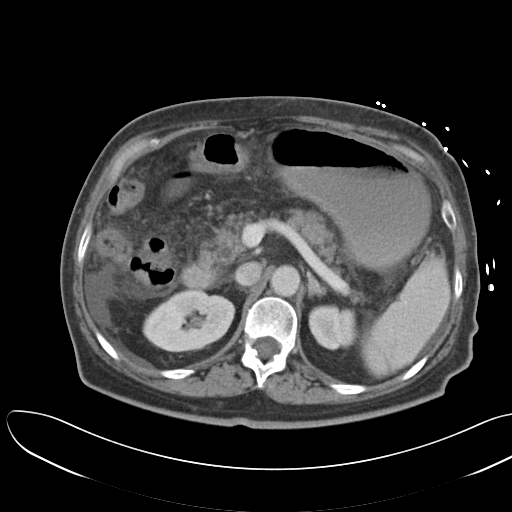
[im 70/102  bone]
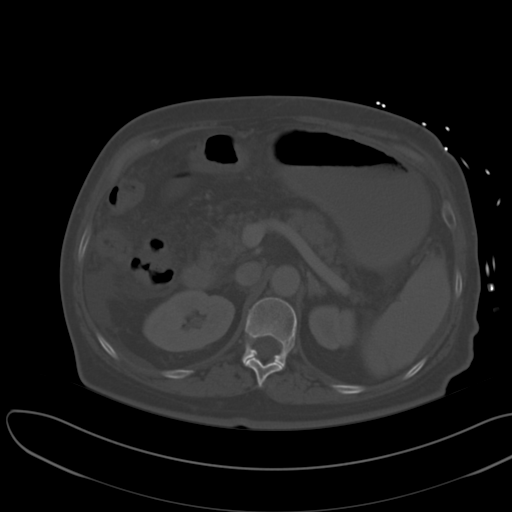
[im 80/102  soft-tissue]
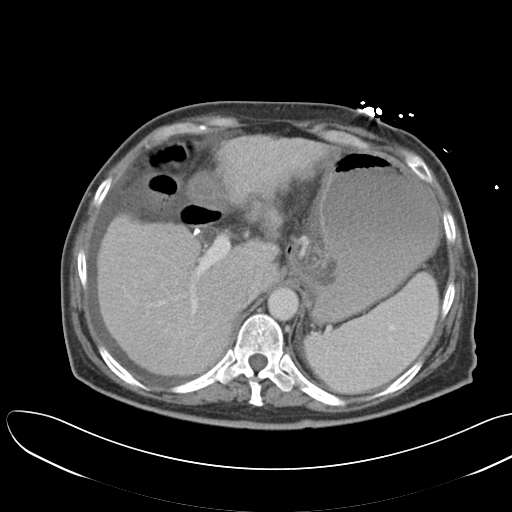
[im 80/102  lung]
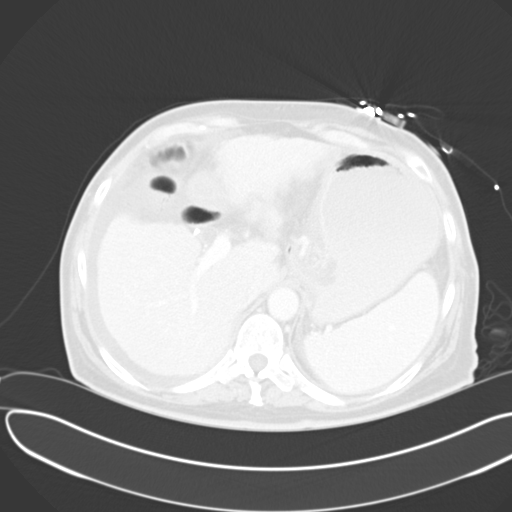
[im 86/102  soft-tissue]
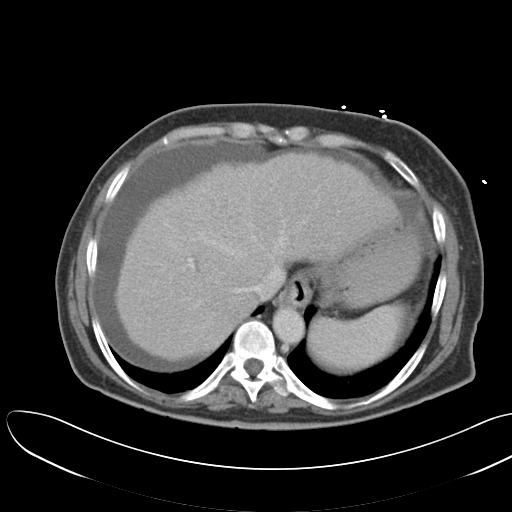
[im 86/102  lung]
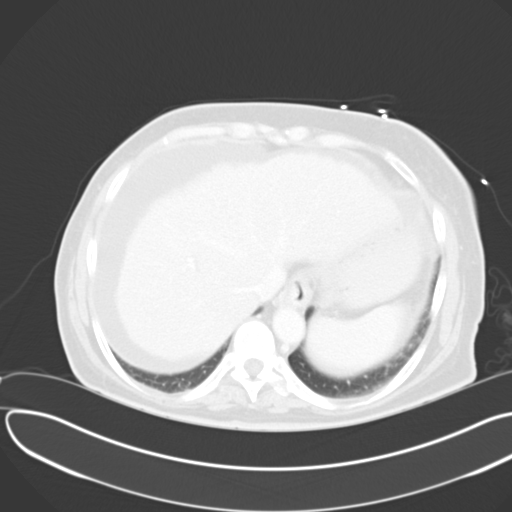
[im 91/102  lung]
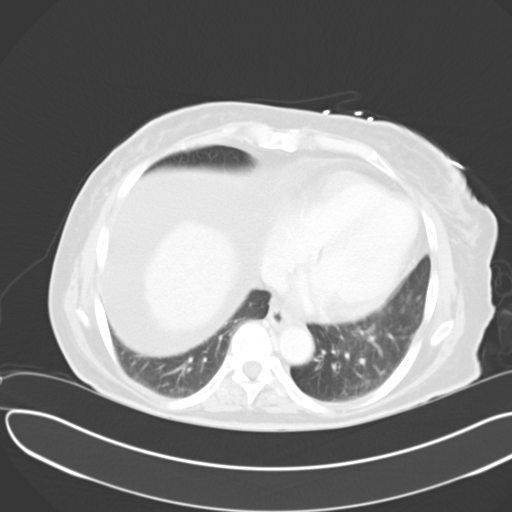
[im 96/102  soft-tissue]
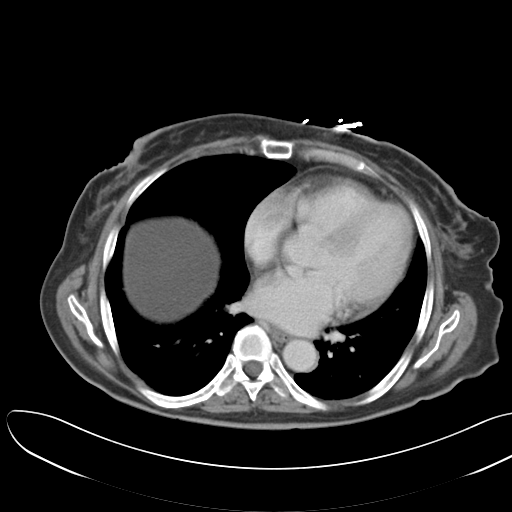
[im 96/102  lung]
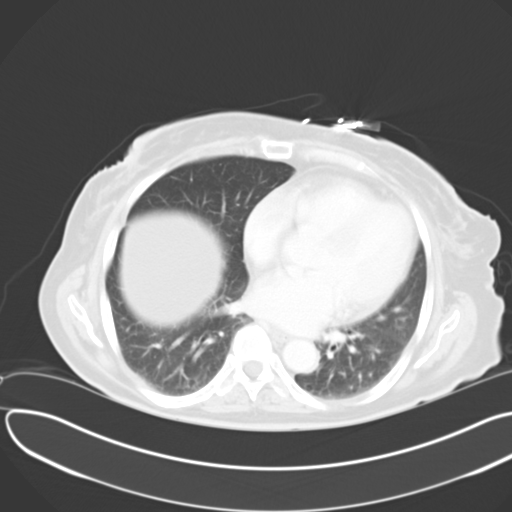

[13 of 32 positions shown; findings below may reference images not displayed]

FINDINGS: Lower chest: Normal heart size. Dependent atelectasis within the
bilateral lower lobes. No pleural effusion.

Hepatobiliary: Liver is small and nodular in contour compatible with
cirrhosis. Patient status post cholecystectomy. No intrahepatic or
extrahepatic biliary ductal dilatation.

Pancreas: Unremarkable

Spleen: Mild splenomegaly measuring 13 cm.

Adrenals/Urinary Tract: The adrenal glands are normal. Kidneys
enhance symmetrically with contrast. There is a 9 mm nonobstructing
stone within the inferior pole of the right kidney. 2 mm
nonobstructing stone interpolar region left kidney (image 114;
series 6). Bilateral too small to characterize low-attenuation renal
lesions. No hydronephrosis. The urinary bladder is unremarkable.

Stomach/Bowel: Descending colon is decompressed, limiting evaluation
however there is suggestion of wall thickening of the descending and
sigmoid colon. No abnormal bowel wall thickening or evidence for
bowel obstruction. No free intraperitoneal air. Small hiatal hernia.
Normal morphology to the stomach.

Vascular/Lymphatic: Normal caliber abdominal aorta. No
retroperitoneal lymphadenopathy.

Other: Patient status post hysterectomy. There is a moderate amount
of ascites throughout the abdomen. Cannot exclude a small amount of
peritoneal enhancement, particularly within the pelvis (image 82;
series 2).

Musculoskeletal: No aggressive or acute appearing osseous lesions.
IMPRESSION: Suggestion of wall thickening of the descending and sigmoid colon
which is nonspecific and may partially be secondary to decompressed
state. Colitis is not excluded. Recommend clinical evaluation.

Morphologic changes to the liver compatible with cirrhosis. Moderate
volume ascites.

Possible mild peritoneal enhancement, particularly within the lower
abdomen and pelvis. Peritonitis is not excluded.

Mild splenomegaly.

Bilateral nonobstructing nephrolithiasis.

## 2017-09-16 DIAGNOSIS — M5412 Radiculopathy, cervical region: Secondary | ICD-10-CM | POA: Diagnosis not present

## 2017-09-16 DIAGNOSIS — M542 Cervicalgia: Secondary | ICD-10-CM | POA: Diagnosis not present

## 2017-09-16 DIAGNOSIS — M5416 Radiculopathy, lumbar region: Secondary | ICD-10-CM | POA: Diagnosis not present

## 2017-09-16 DIAGNOSIS — F1120 Opioid dependence, uncomplicated: Secondary | ICD-10-CM | POA: Diagnosis not present

## 2017-09-16 DIAGNOSIS — M545 Low back pain: Secondary | ICD-10-CM | POA: Diagnosis not present

## 2017-09-16 DIAGNOSIS — I10 Essential (primary) hypertension: Secondary | ICD-10-CM | POA: Diagnosis not present

## 2017-09-16 DIAGNOSIS — G894 Chronic pain syndrome: Secondary | ICD-10-CM | POA: Diagnosis not present

## 2017-09-16 DIAGNOSIS — Z79899 Other long term (current) drug therapy: Secondary | ICD-10-CM | POA: Diagnosis not present

## 2017-10-04 IMAGING — CR DG CHEST 2V
2 series · 2 of 2 positions shown · non-contrast
Comparison: Chest radiograph performed 10/05/2015

CLINICAL DATA: Acute onset of syncope. Generalized weakness and
frequent falls. Initial encounter.

EXAM:
CHEST  2 VIEW

[chest lat]
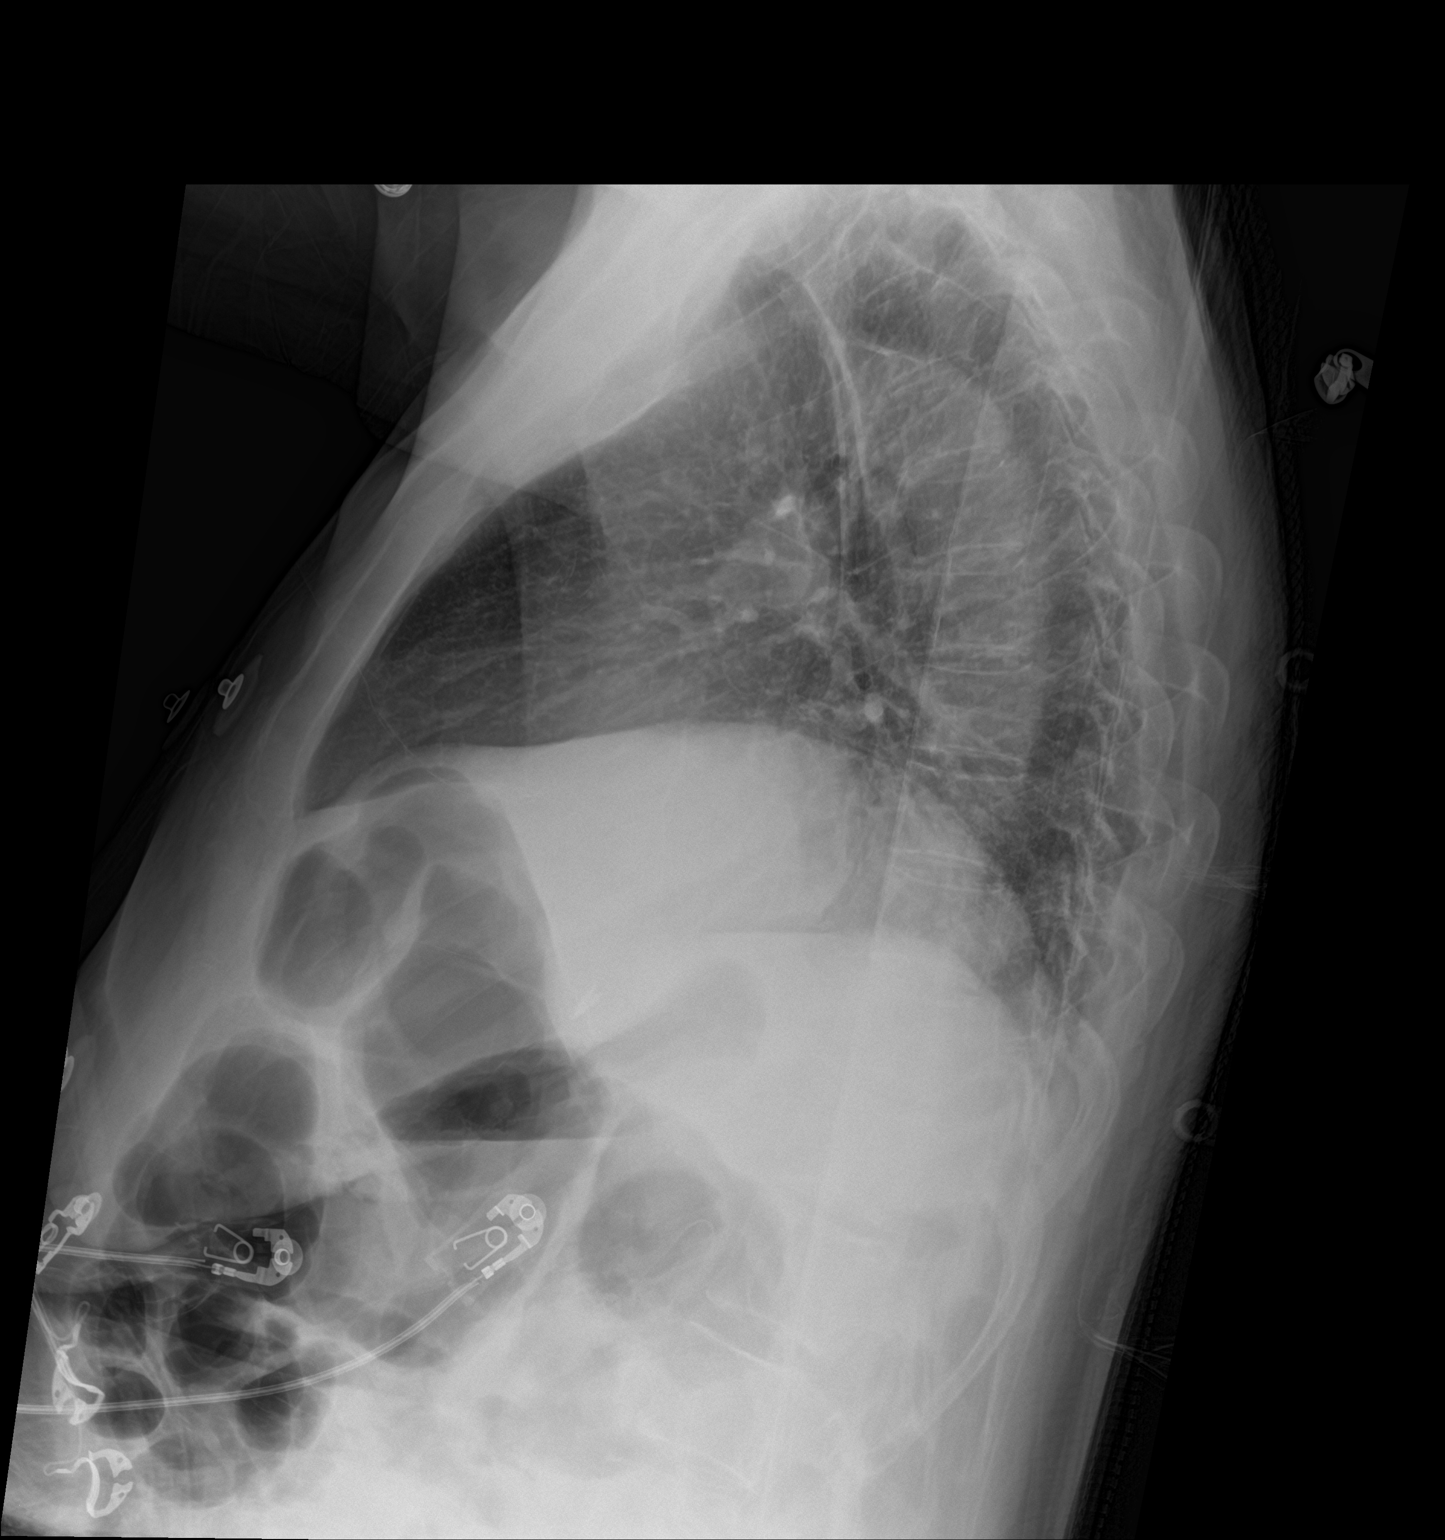

[chest ap]
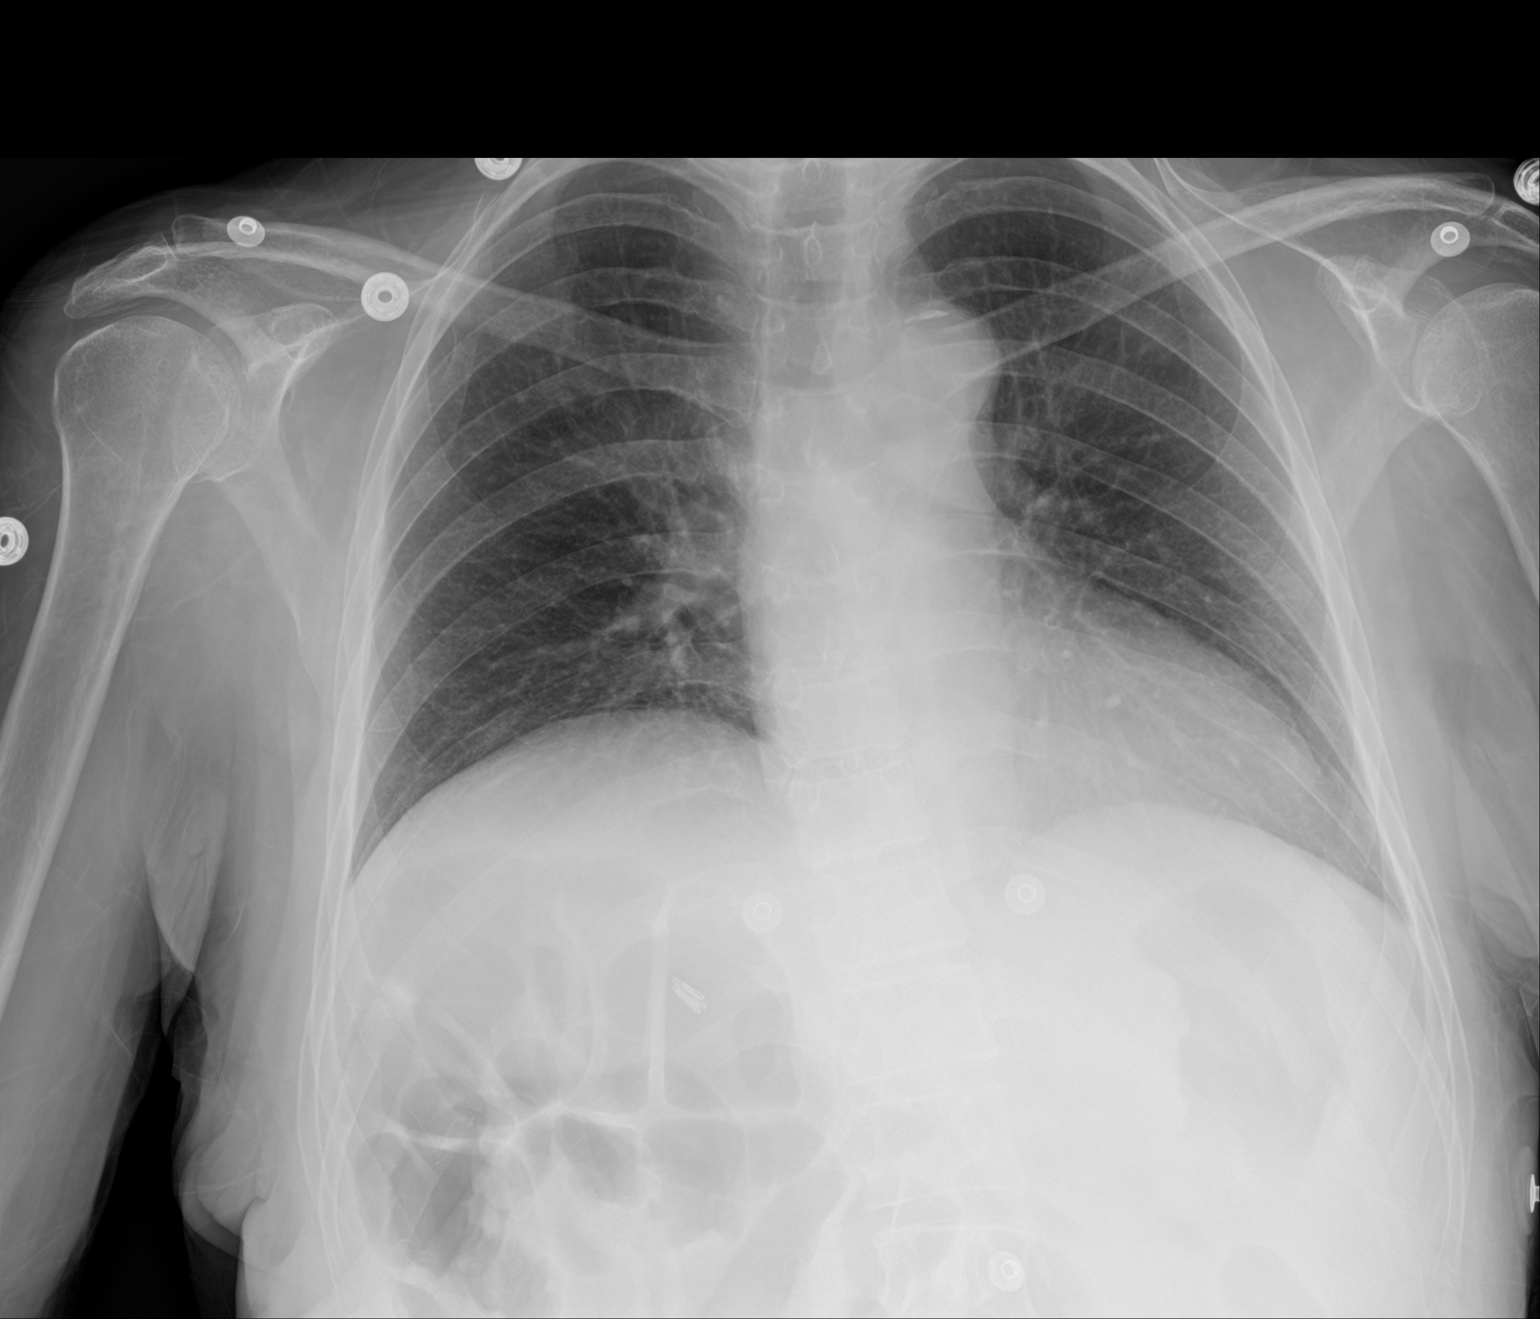

[2 of 2 positions shown; findings below may reference images not displayed]

FINDINGS: The lungs are mildly hypoexpanded but appear grossly clear. There is
no evidence of focal opacification, pleural effusion or
pneumothorax.

The heart is normal in size; the mediastinal contour is within
normal limits. No acute osseous abnormalities are seen. Clips are
noted within the right upper quadrant, reflecting prior
cholecystectomy.
IMPRESSION: Lungs mildly hypoexpanded but grossly clear. No displaced rib
fracture seen.

## 2017-10-04 IMAGING — CR DG ABD PORTABLE 1V
1 series · 1 of 1 positions shown · non-contrast
Comparison: Abdominal radiographs August 04, 2015; CT abdomen and
pelvis October 14, 2015

CLINICAL DATA: Four day history of abdominal pain and emesis

EXAM:
PORTABLE ABDOMEN - 1 VIEW

[AP]
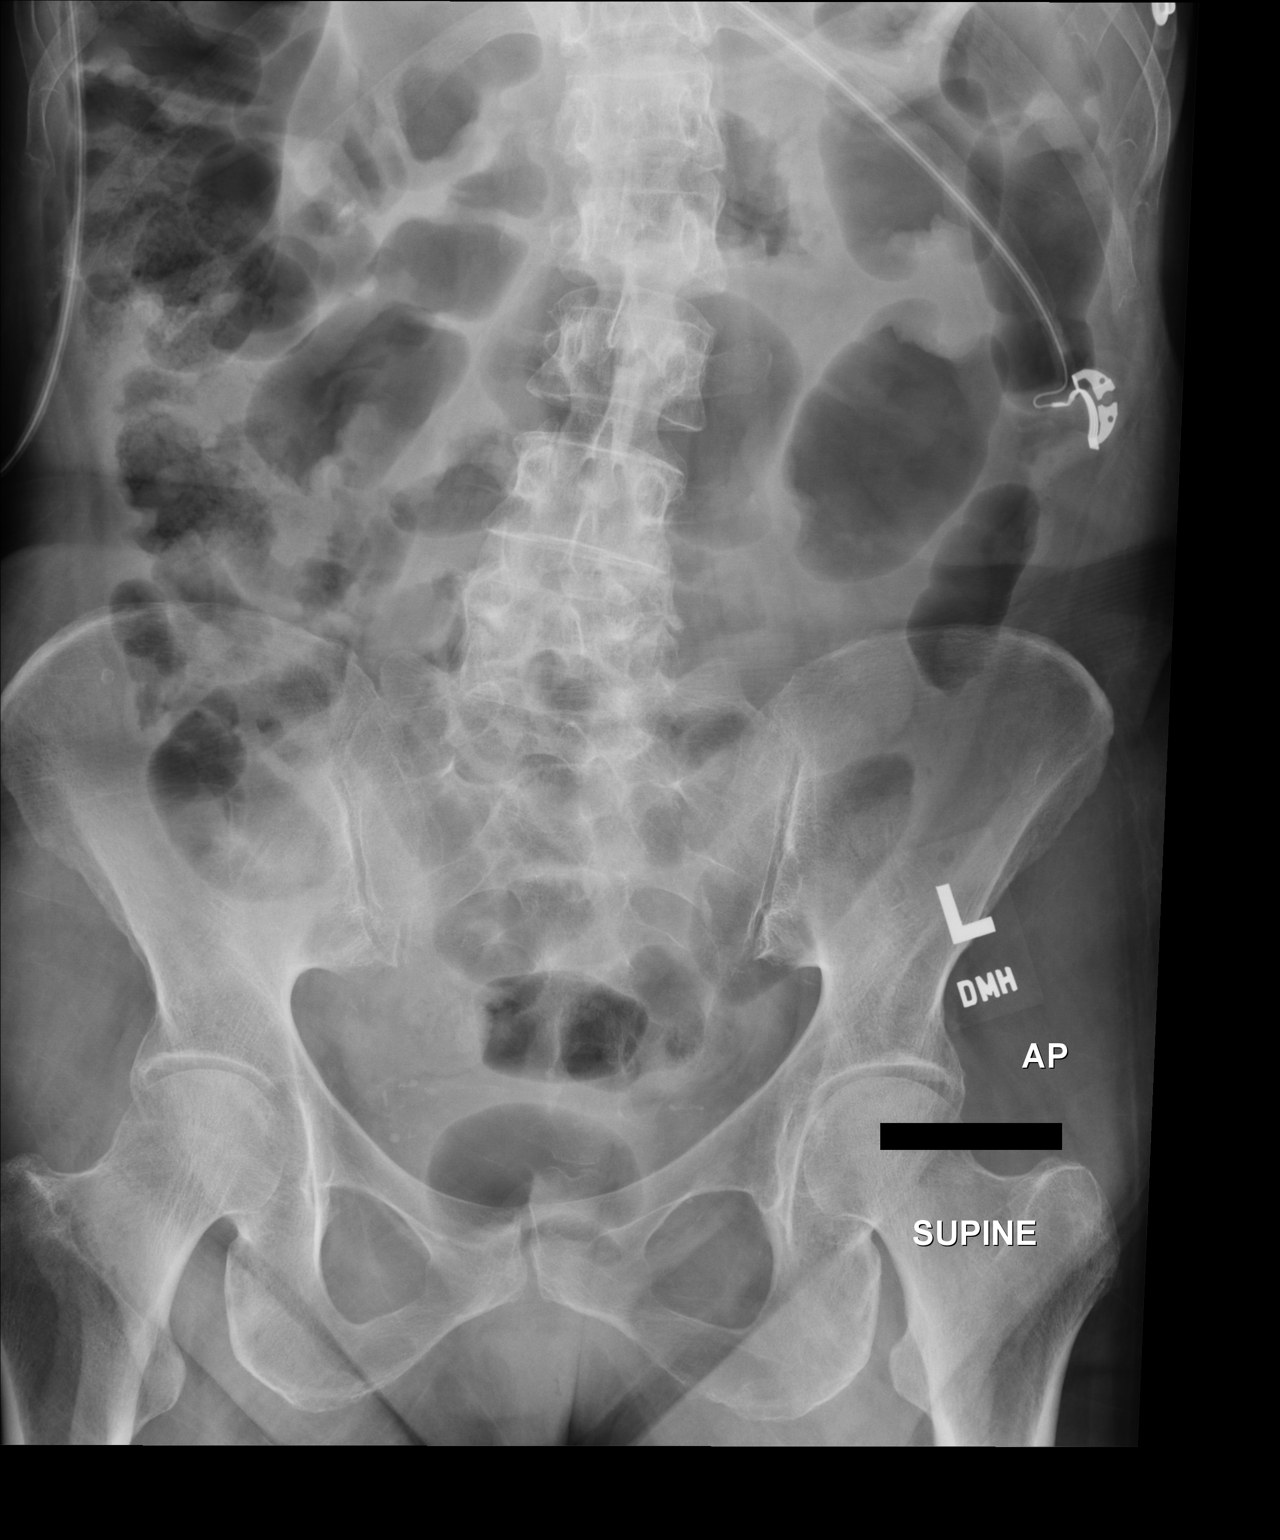

[1 of 1 positions shown; findings below may reference images not displayed]

FINDINGS: There is no appreciable bowel dilatation or air-fluid level
suggesting obstruction. No free air is seen on this supine
examination. There is moderate stool in the colon. There are
phleboliths in the pelvis. There is lumbar levoscoliosis.
IMPRESSION: No bowel obstruction or free air evident.  Moderate stool in colon.

## 2017-10-06 IMAGING — DX DG RIBS 2V*R*
3 series · 3 of 3 positions shown · non-contrast
Comparison: Chest radiographs 11/11/2015

CLINICAL DATA: Syncopal episode, fall 2 days ago, anterior RIGHT
lower rib pain since, dyspnea

EXAM:
RIGHT RIBS - 2 VIEW

[t ribs ap upper right]
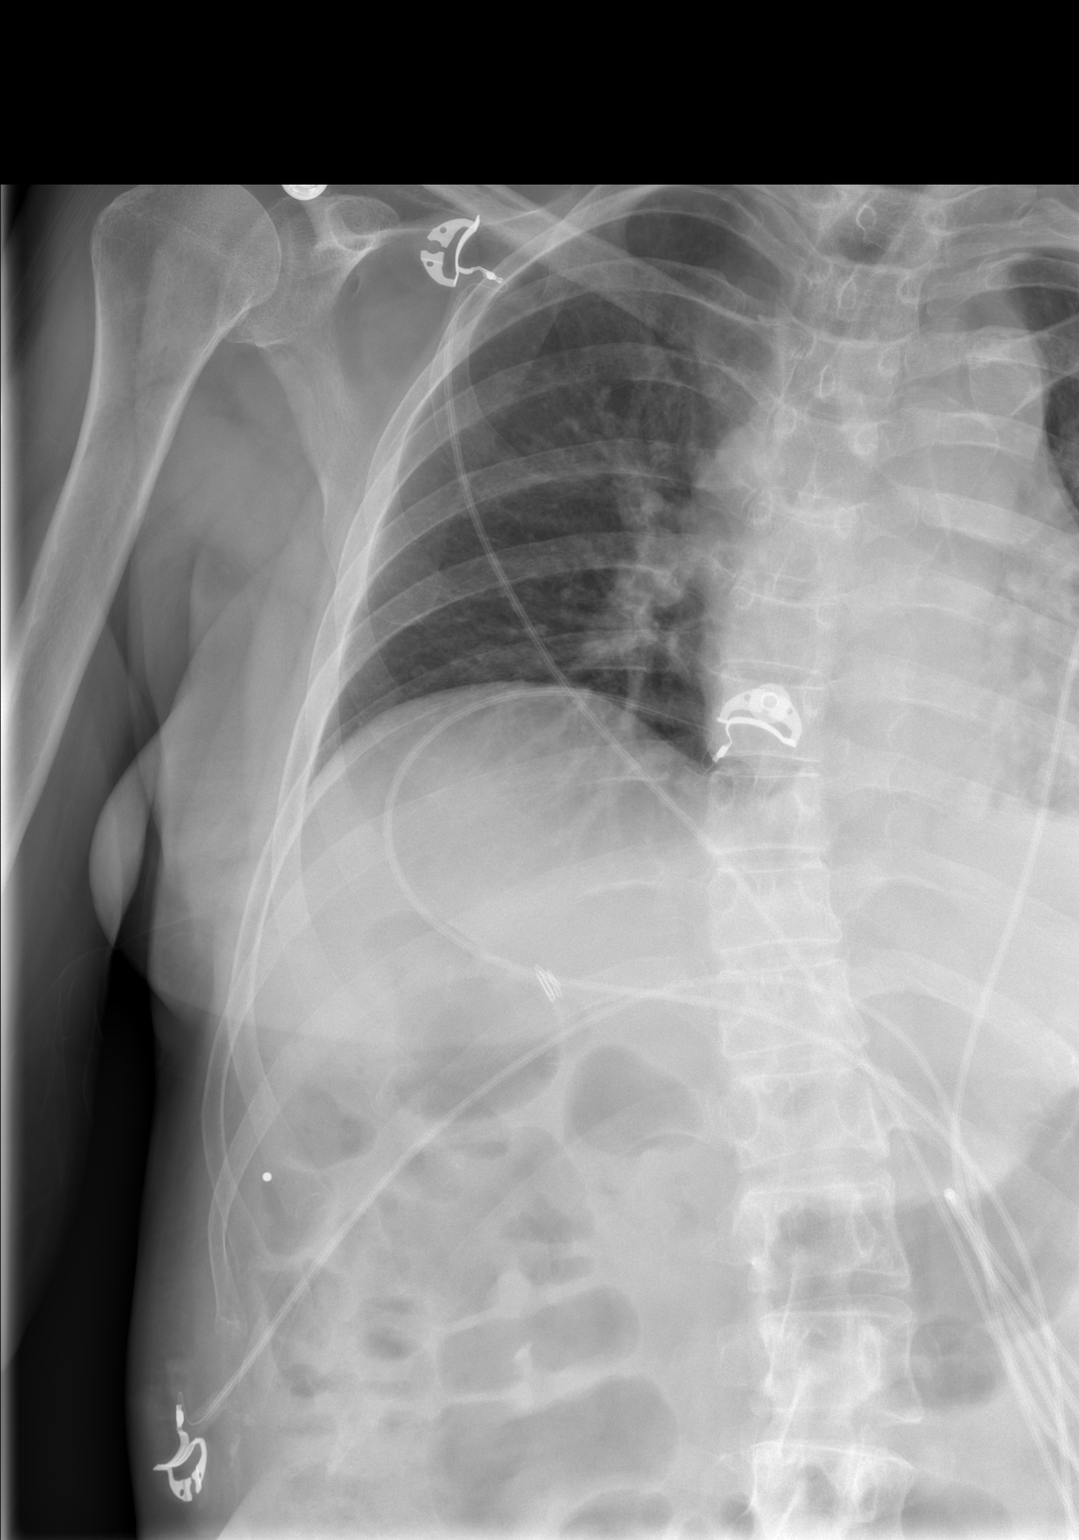

[t ribs rpo right (1 of 2)]
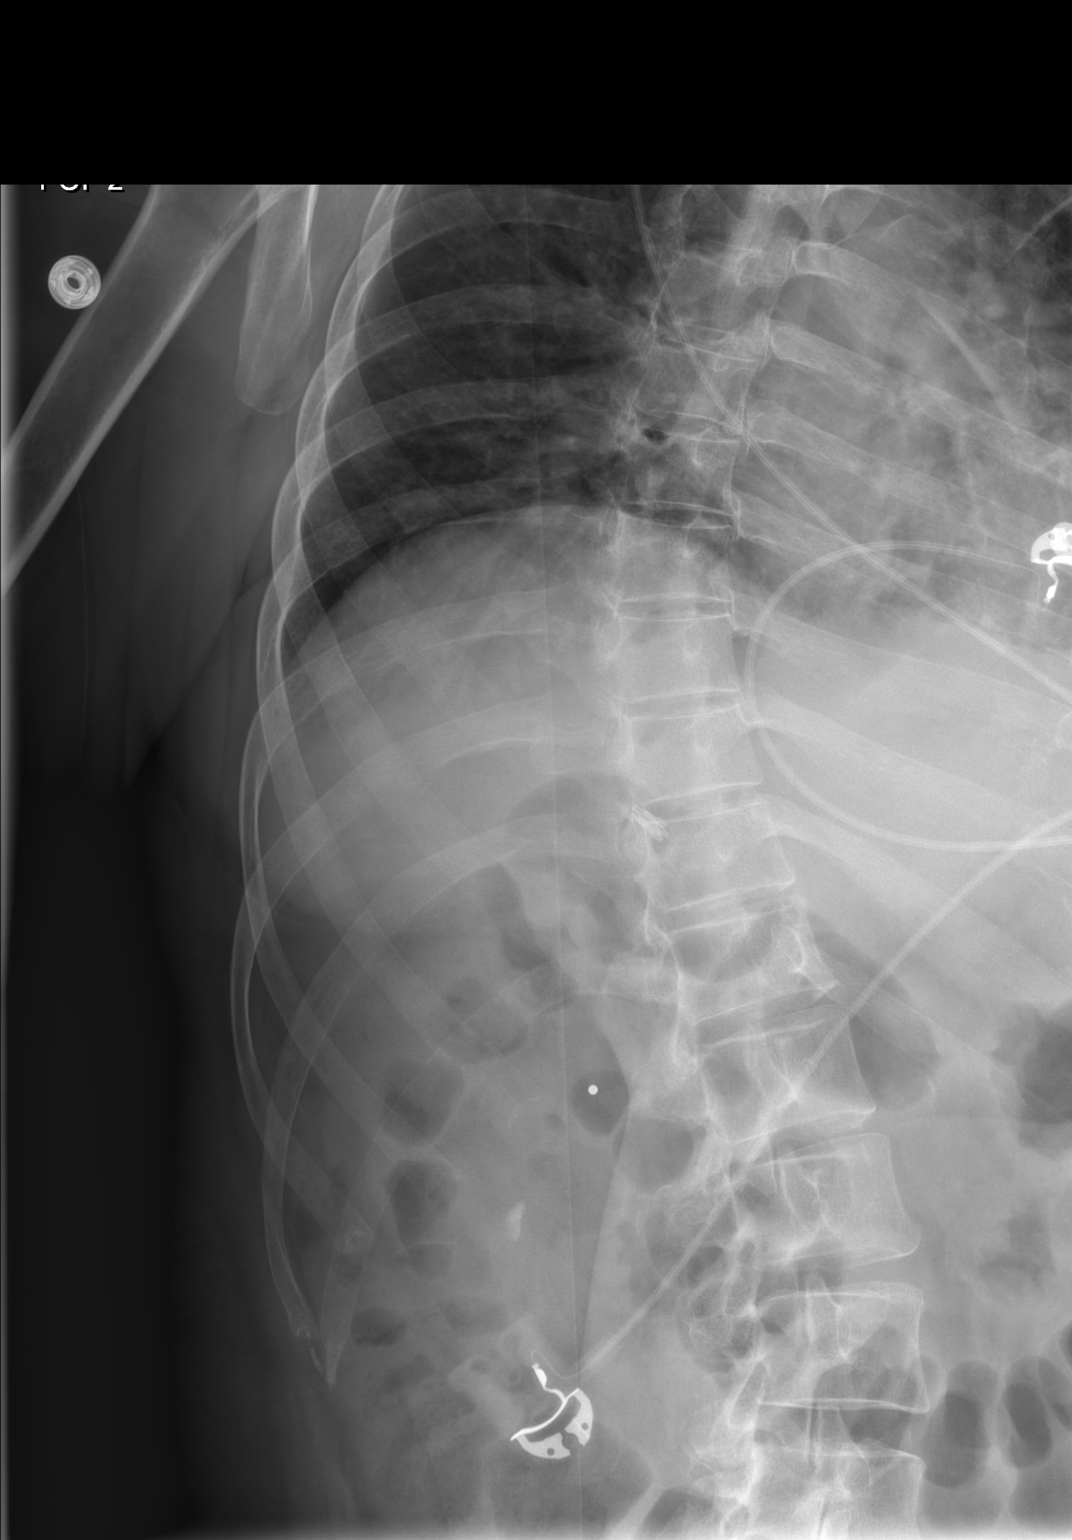

[t ribs rpo right (2 of 2)]
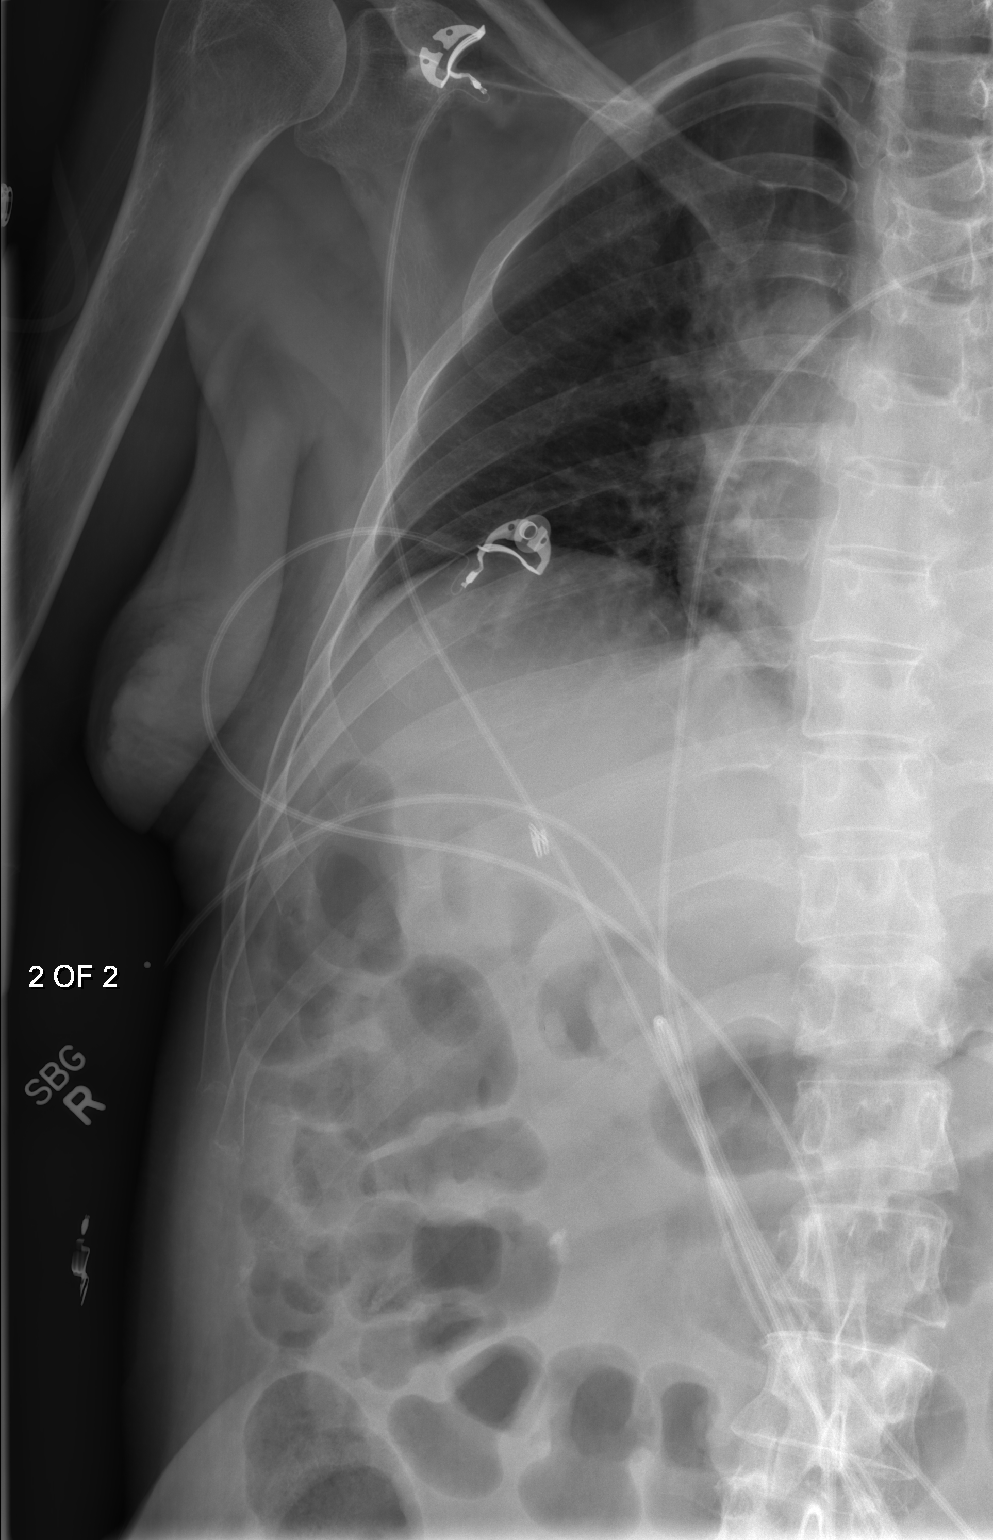

[3 of 3 positions shown; findings below may reference images not displayed]

FINDINGS: BB placed at site of symptoms lower anterior RIGHT chest.

Bones appear mildly demineralized.

No fracture or bone destruction identified.

Surgical clips RIGHT upper quadrant likely cholecystectomy.

11 mm RIGHT renal calculus.
IMPRESSION: No acute RIGHT rib abnormalities.

11 mm RIGHT renal calculus.

## 2017-10-07 IMAGING — DX DG CHEST 2V
2 series · 2 of 2 positions shown · non-contrast
Comparison: PA and lateral chest x-ray dated November 11, 2015

CLINICAL DATA: Several days of cough, onset of fever today,
diabetes, cryptogenic cirrhosis, previous CVA.

EXAM:
CHEST  2 VIEW

[w chest pa]
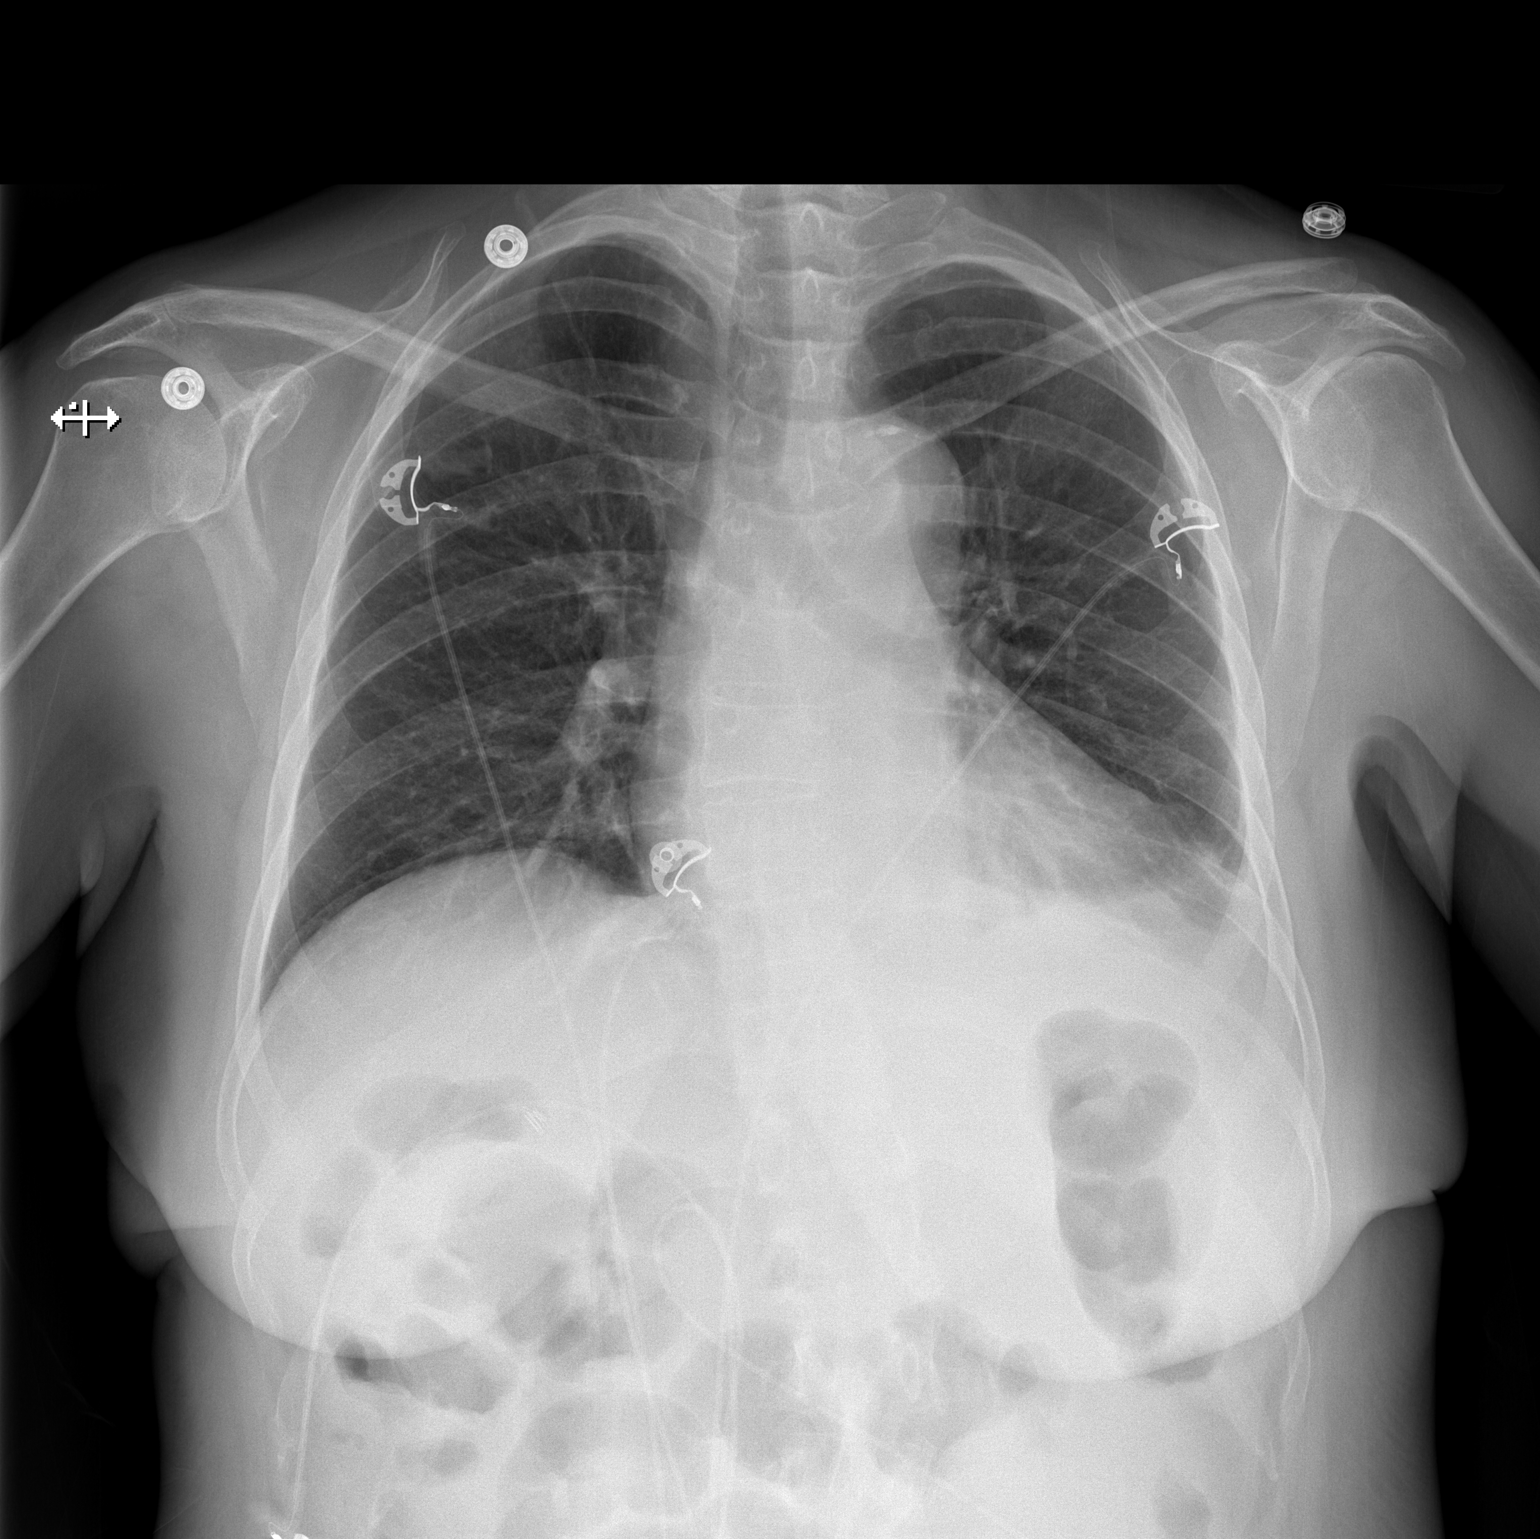

[w chest lat]
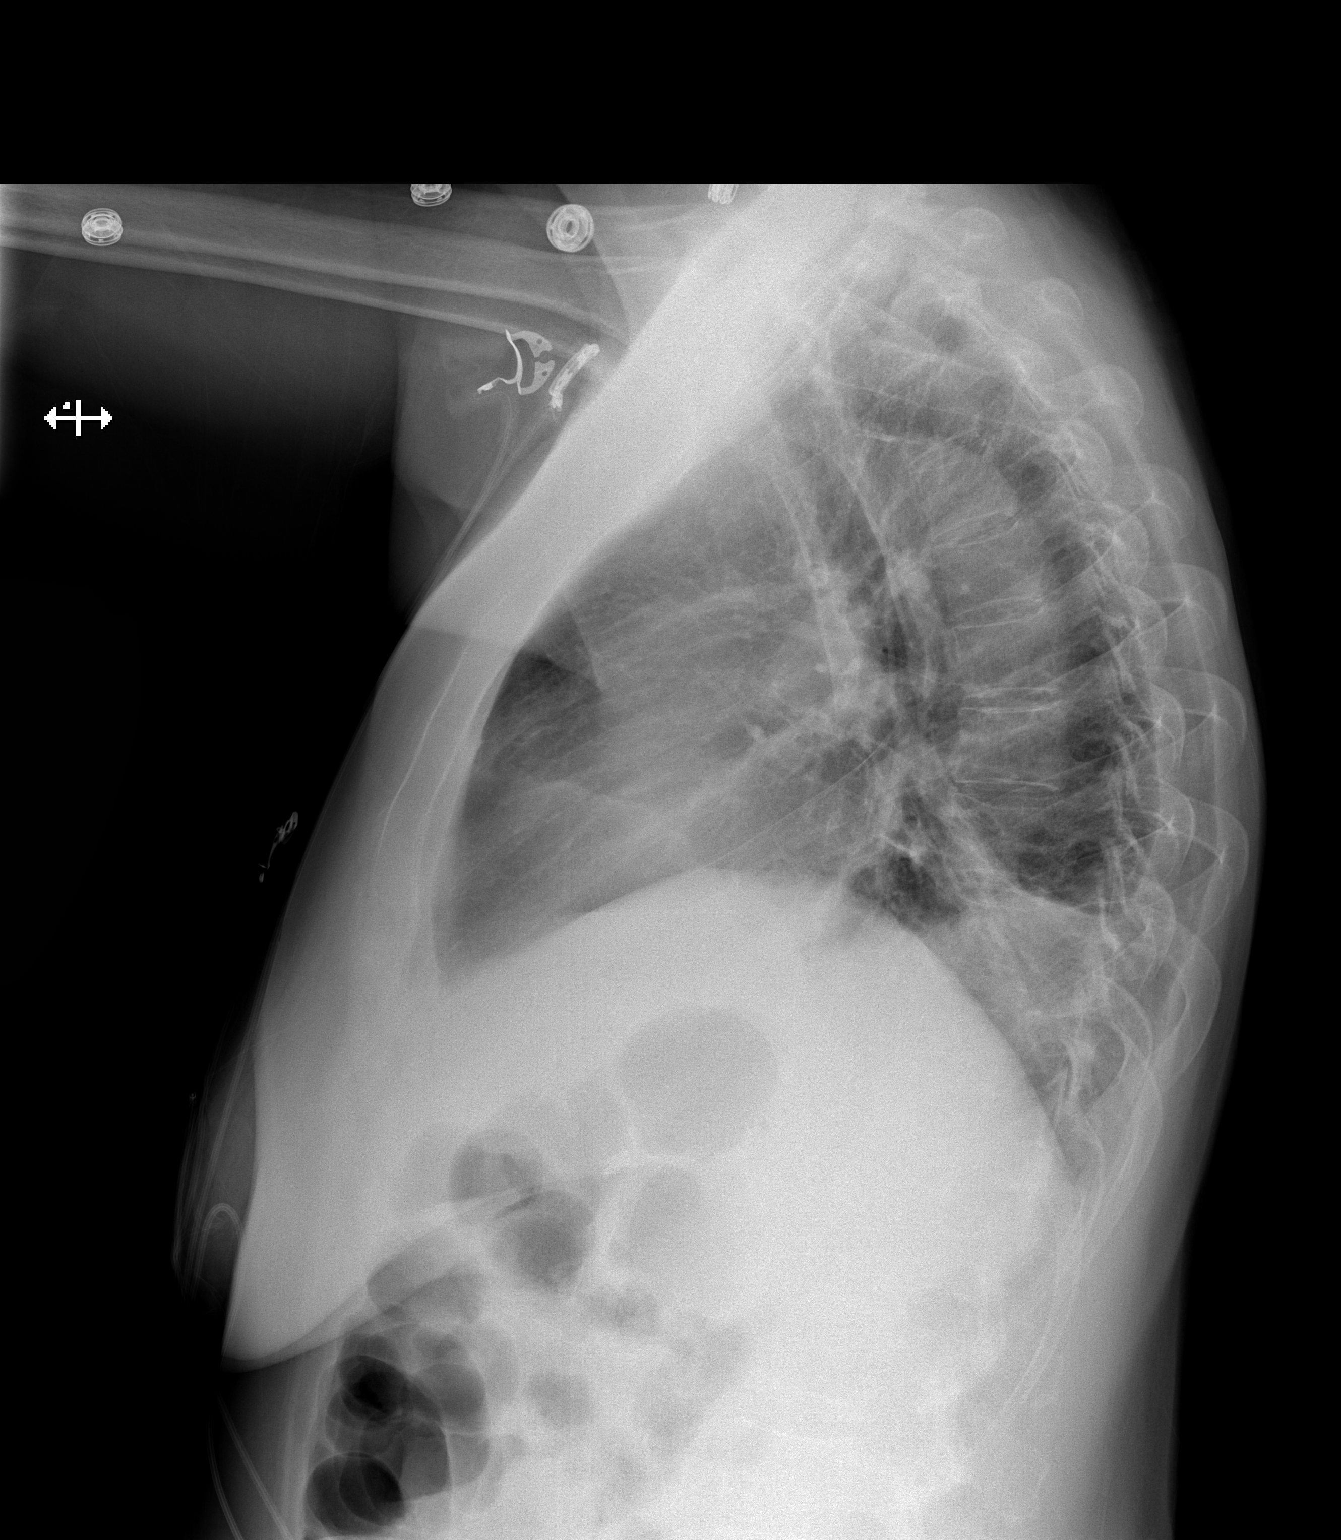

[2 of 2 positions shown; findings below may reference images not displayed]

FINDINGS: There is new infiltrate in the left lower lobe posteriorly. There is
small left pleural effusion. The right lung is clear. The heart is
top-normal in size. The mediastinum is normal in width. The bony
thorax exhibits no acute abnormality.
IMPRESSION: Left lower lobe pneumonia with small left pleural effusion. Followup
PA and lateral chest X-ray is recommended in 3-4 weeks following
trial of antibiotic therapy to ensure resolution and exclude
underlying malignancy.

## 2017-10-07 IMAGING — CT CT ABD-PELV W/ CM
2 of 5 series · 16 of 46 positions shown, 18 images · IV contrast (APPLIED)
Comparison: CT scan of October 14, 2015.

CLINICAL DATA: Upper abdominal pain for 1 week.

EXAM:
CT ABDOMEN AND PELVIS WITH CONTRAST
TECHNIQUE: Multidetector CT imaging of the abdomen and pelvis was performed
using the standard protocol following bolus administration of
intravenous contrast.
CONTRAST:  100mL OMNIPAQUE IOHEXOL 300 MG/ML  SOLN

[Series 2: abd/ pelvis 5.0 i30f 1 · axial · 0.83mm/px · z∈[-887,-467]mm · 13 of 96 slices shown, 15 images]
[im 6/96  soft-tissue]
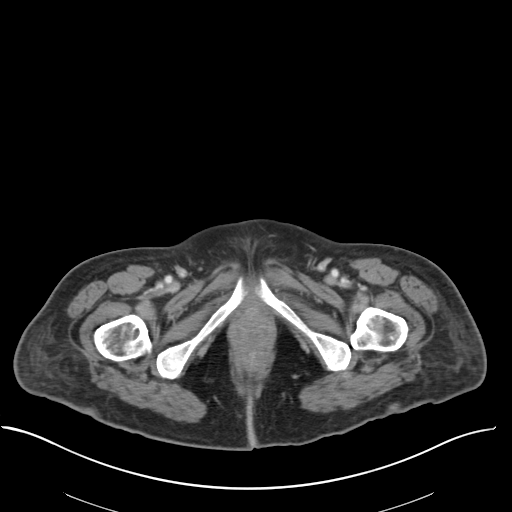
[im 6/96  bone]
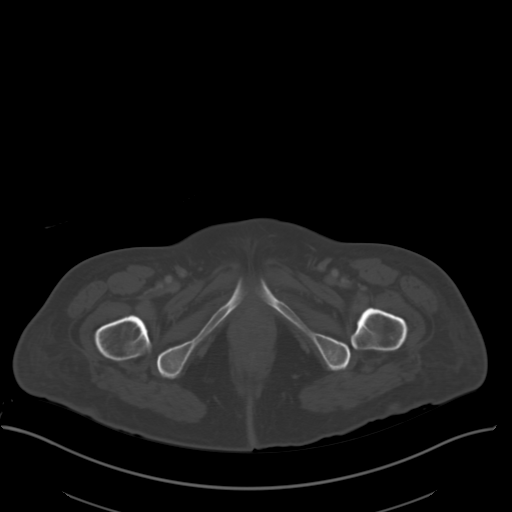
[im 11/96  soft-tissue]
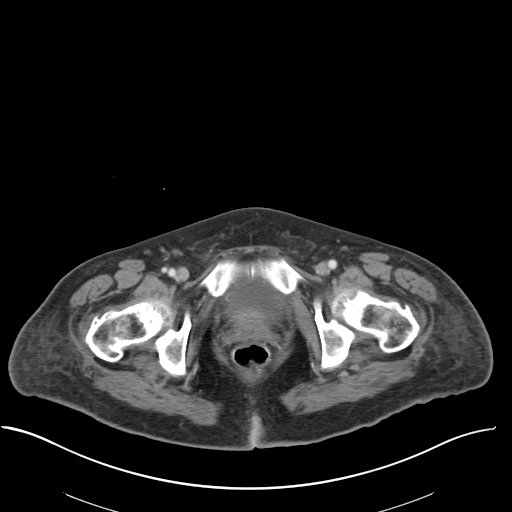
[im 22/96  soft-tissue]
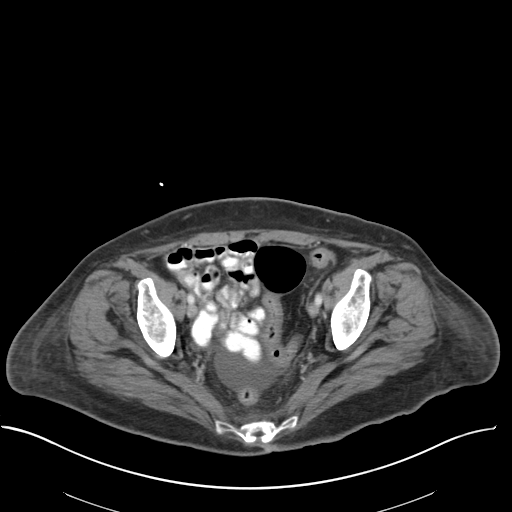
[im 27/96  soft-tissue]
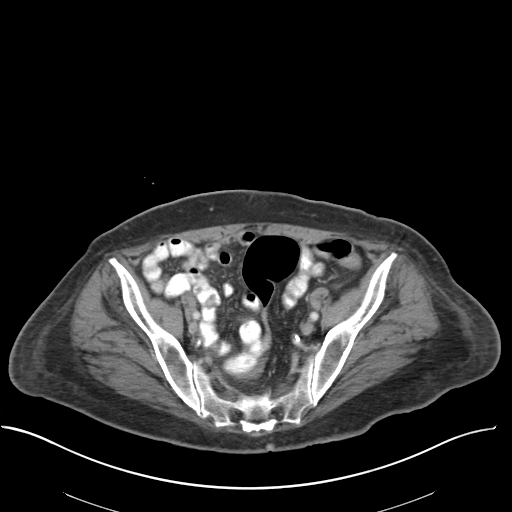
[im 32/96  soft-tissue]
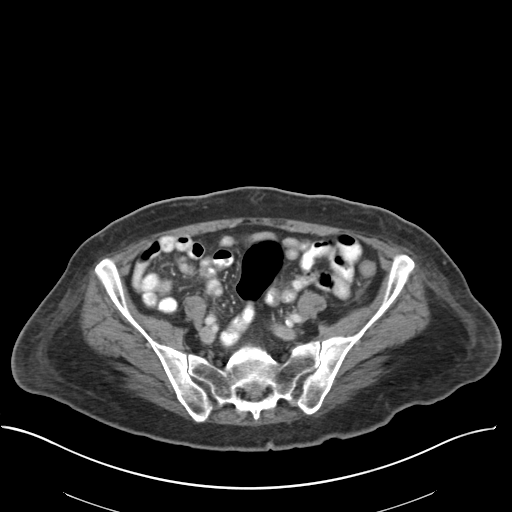
[im 43/96  soft-tissue]
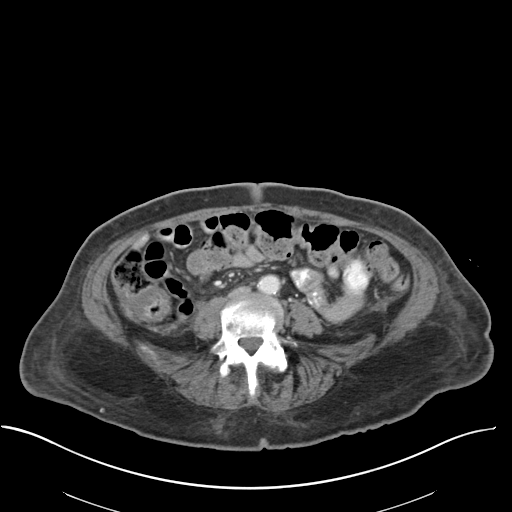
[im 48/96  soft-tissue]
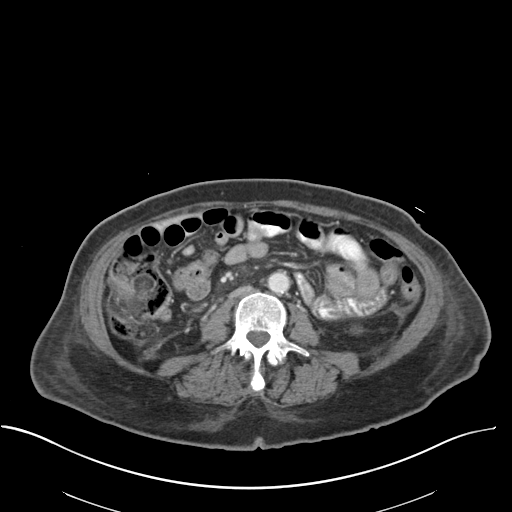
[im 53/96  soft-tissue]
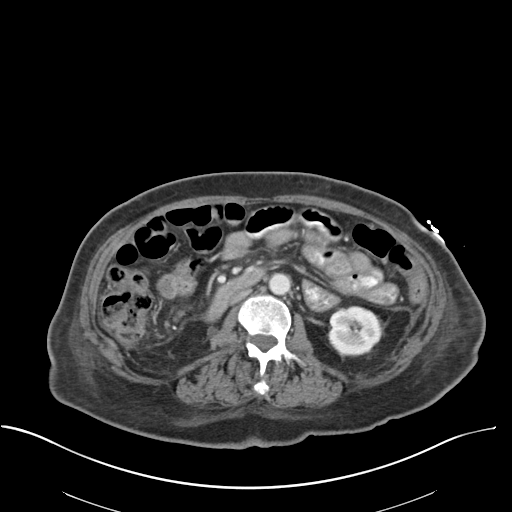
[im 64/96  soft-tissue]
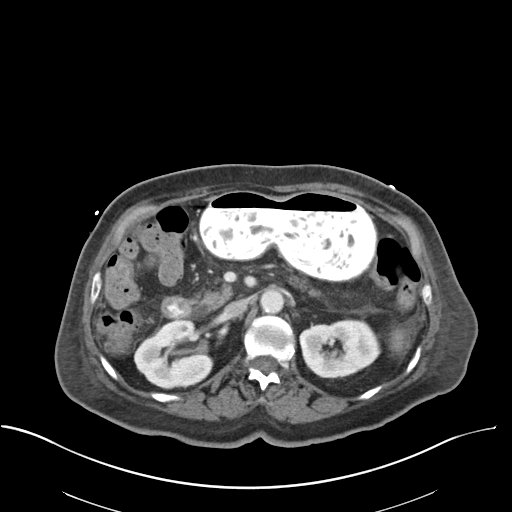
[im 64/96  bone]
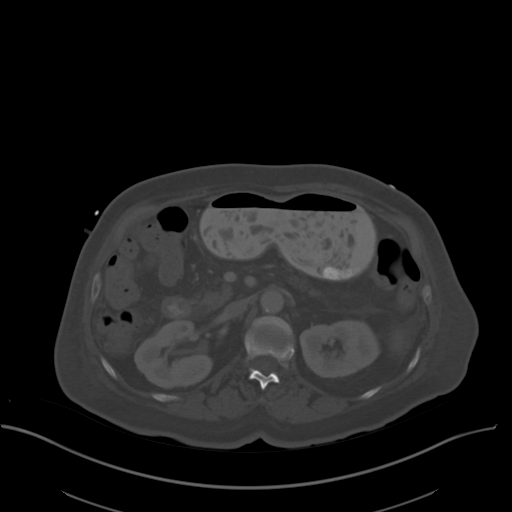
[im 69/96  soft-tissue]
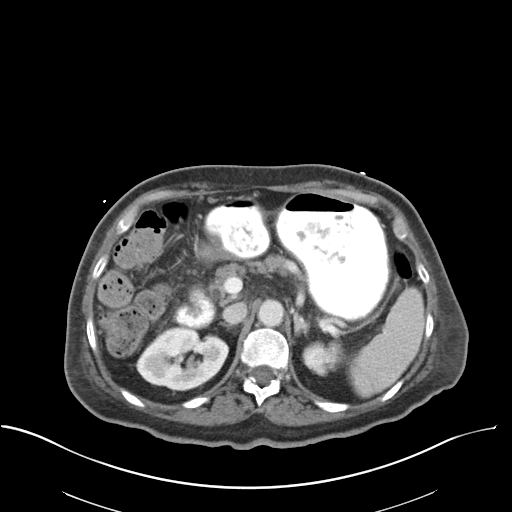
[im 74/96  soft-tissue]
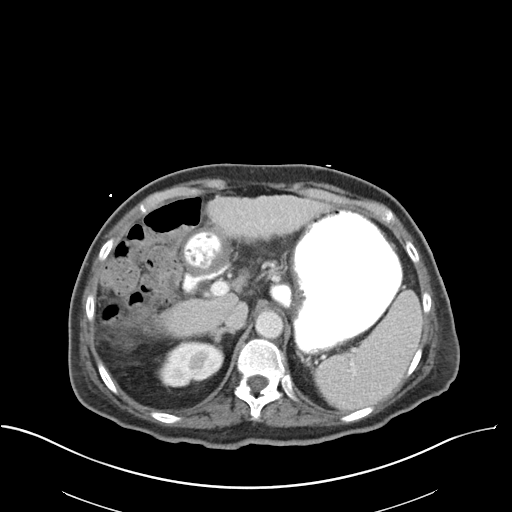
[im 85/96  soft-tissue]
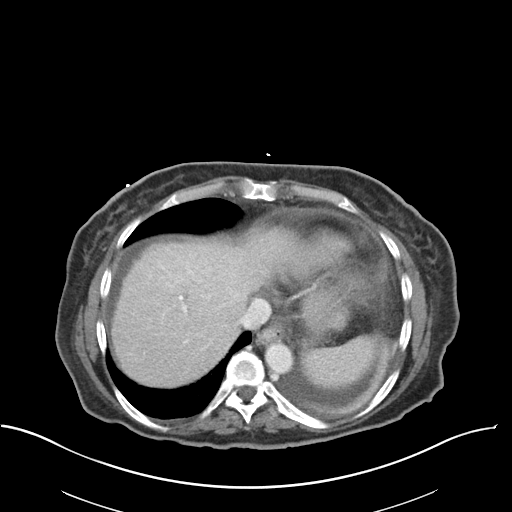
[im 90/96  soft-tissue]
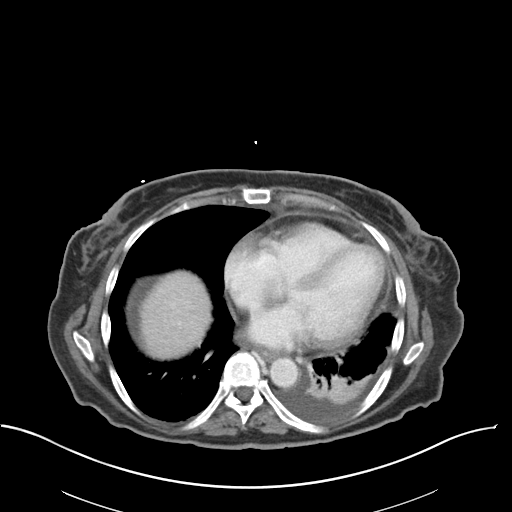

[Series 6: coronal · coronal · 0.74mm/px · 3 of 111 slices shown]
[im 37/111  soft-tissue]
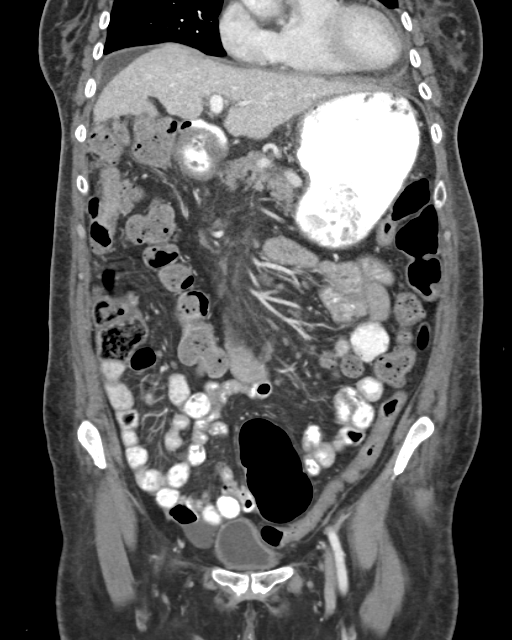
[im 49/111  soft-tissue]
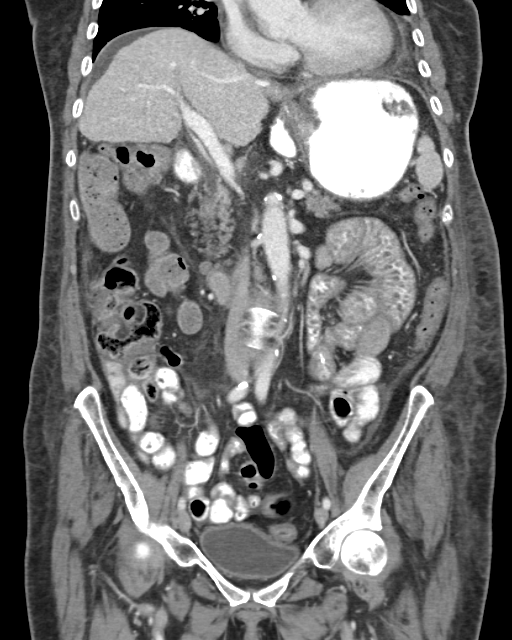
[im 62/111  soft-tissue]
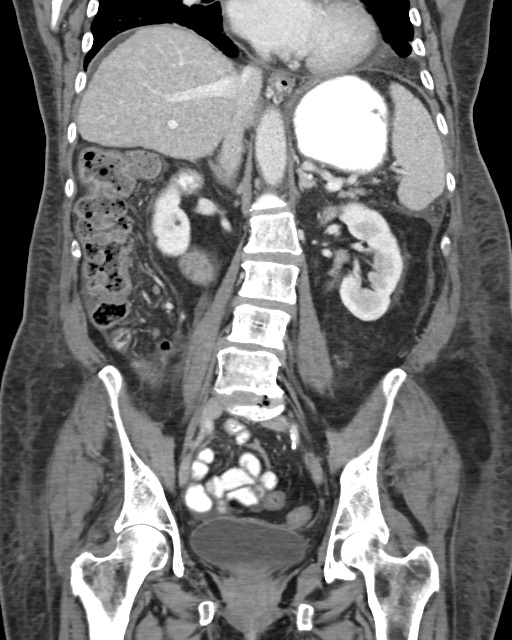

[16 of 46 positions shown; findings below may reference images not displayed]

FINDINGS: Mild left pleural effusion is noted with adjacent subsegmental
atelectasis. No significant osseous abnormality is noted.

Status post cholecystectomy. Nodular hepatic margins are noted
consistent with hepatic cirrhosis. The spleen and pancreas appear
normal. Adrenal glands are unremarkable. Nonobstructive calculus is
noted in lower pole collecting system of right kidney. Kidneys are
otherwise normal. No hydronephrosis or renal obstruction is noted.
No ureteral calculi are noted. Atherosclerosis of abdominal aorta is
noted without aneurysm formation. Urinary bladder appears normal.
Mild amount of free fluid is noted posteriorly in the pelvis. No
significant adenopathy is noted.
IMPRESSION: Atherosclerosis of abdominal aorta without aneurysm formation.

Findings consistent with hepatic cirrhosis.

Nonobstructive right renal calculus is noted. No hydronephrosis or
renal obstruction is noted.

## 2017-10-21 DIAGNOSIS — M5416 Radiculopathy, lumbar region: Secondary | ICD-10-CM | POA: Diagnosis not present

## 2017-10-21 DIAGNOSIS — I10 Essential (primary) hypertension: Secondary | ICD-10-CM | POA: Diagnosis not present

## 2017-10-21 DIAGNOSIS — G894 Chronic pain syndrome: Secondary | ICD-10-CM | POA: Diagnosis not present

## 2017-10-21 DIAGNOSIS — M545 Low back pain: Secondary | ICD-10-CM | POA: Diagnosis not present

## 2017-10-21 DIAGNOSIS — M5412 Radiculopathy, cervical region: Secondary | ICD-10-CM | POA: Diagnosis not present

## 2017-10-21 DIAGNOSIS — M542 Cervicalgia: Secondary | ICD-10-CM | POA: Diagnosis not present

## 2017-10-21 DIAGNOSIS — F1120 Opioid dependence, uncomplicated: Secondary | ICD-10-CM | POA: Diagnosis not present

## 2017-10-21 DIAGNOSIS — Z79899 Other long term (current) drug therapy: Secondary | ICD-10-CM | POA: Diagnosis not present

## 2017-10-21 DIAGNOSIS — G8929 Other chronic pain: Secondary | ICD-10-CM | POA: Diagnosis not present

## 2017-11-08 IMAGING — DX DG ABDOMEN ACUTE W/ 1V CHEST
4 series · 4 of 4 positions shown · non-contrast
Comparison: CT Abdomen and Pelvis 11/14/2015 and earlier.

CLINICAL DATA: 59-year-old female with abdominal pain nausea
vomiting and diarrhea for 4 days. Initial encounter.

EXAM:
DG ABDOMEN ACUTE W/ 1V CHEST

[chest pa]
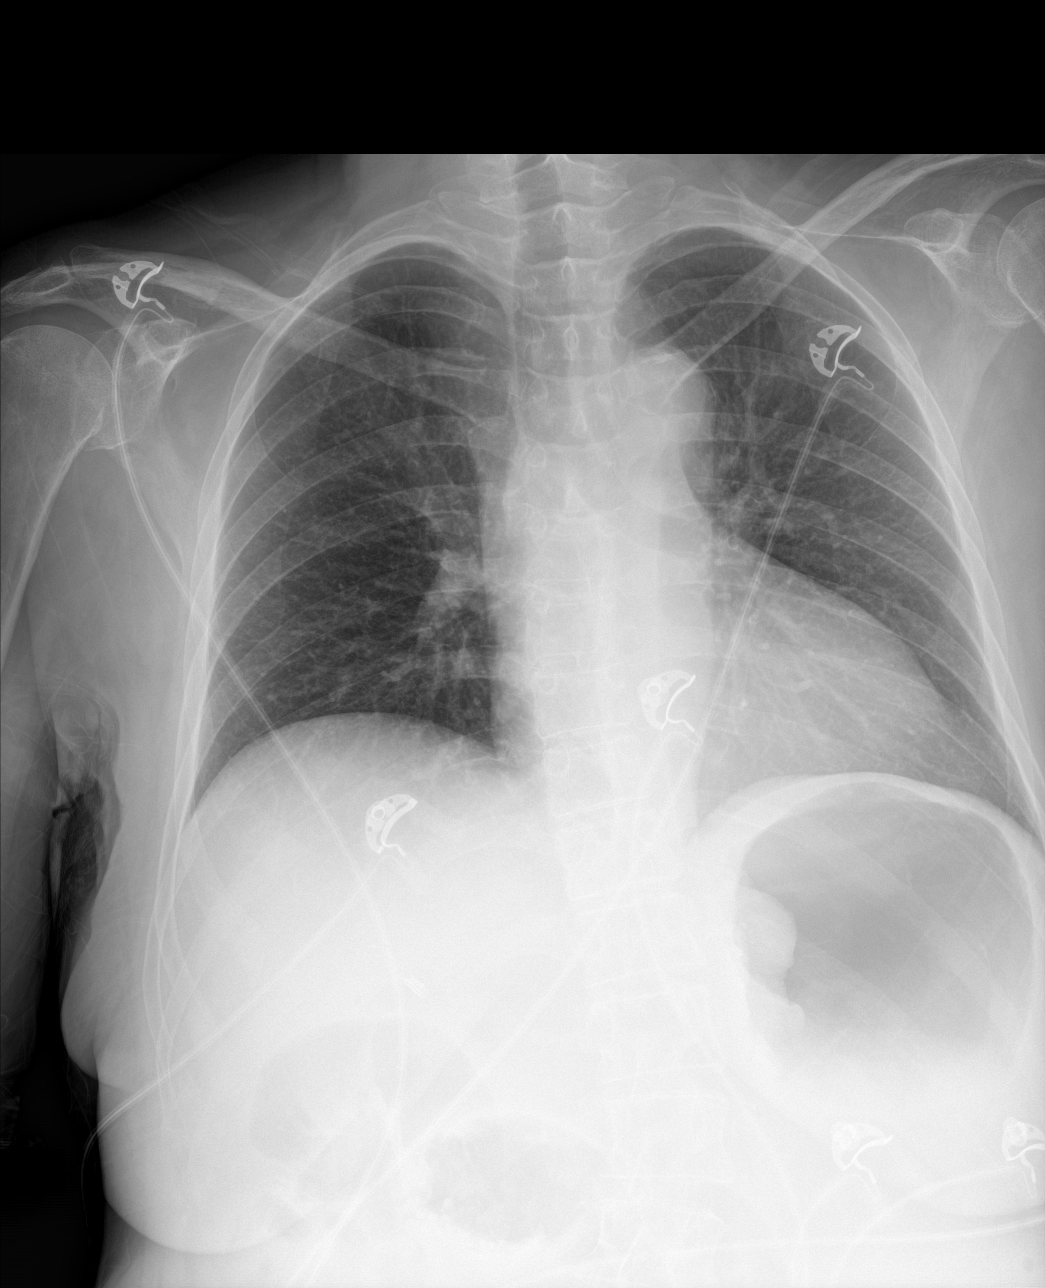

[abdomen erect]
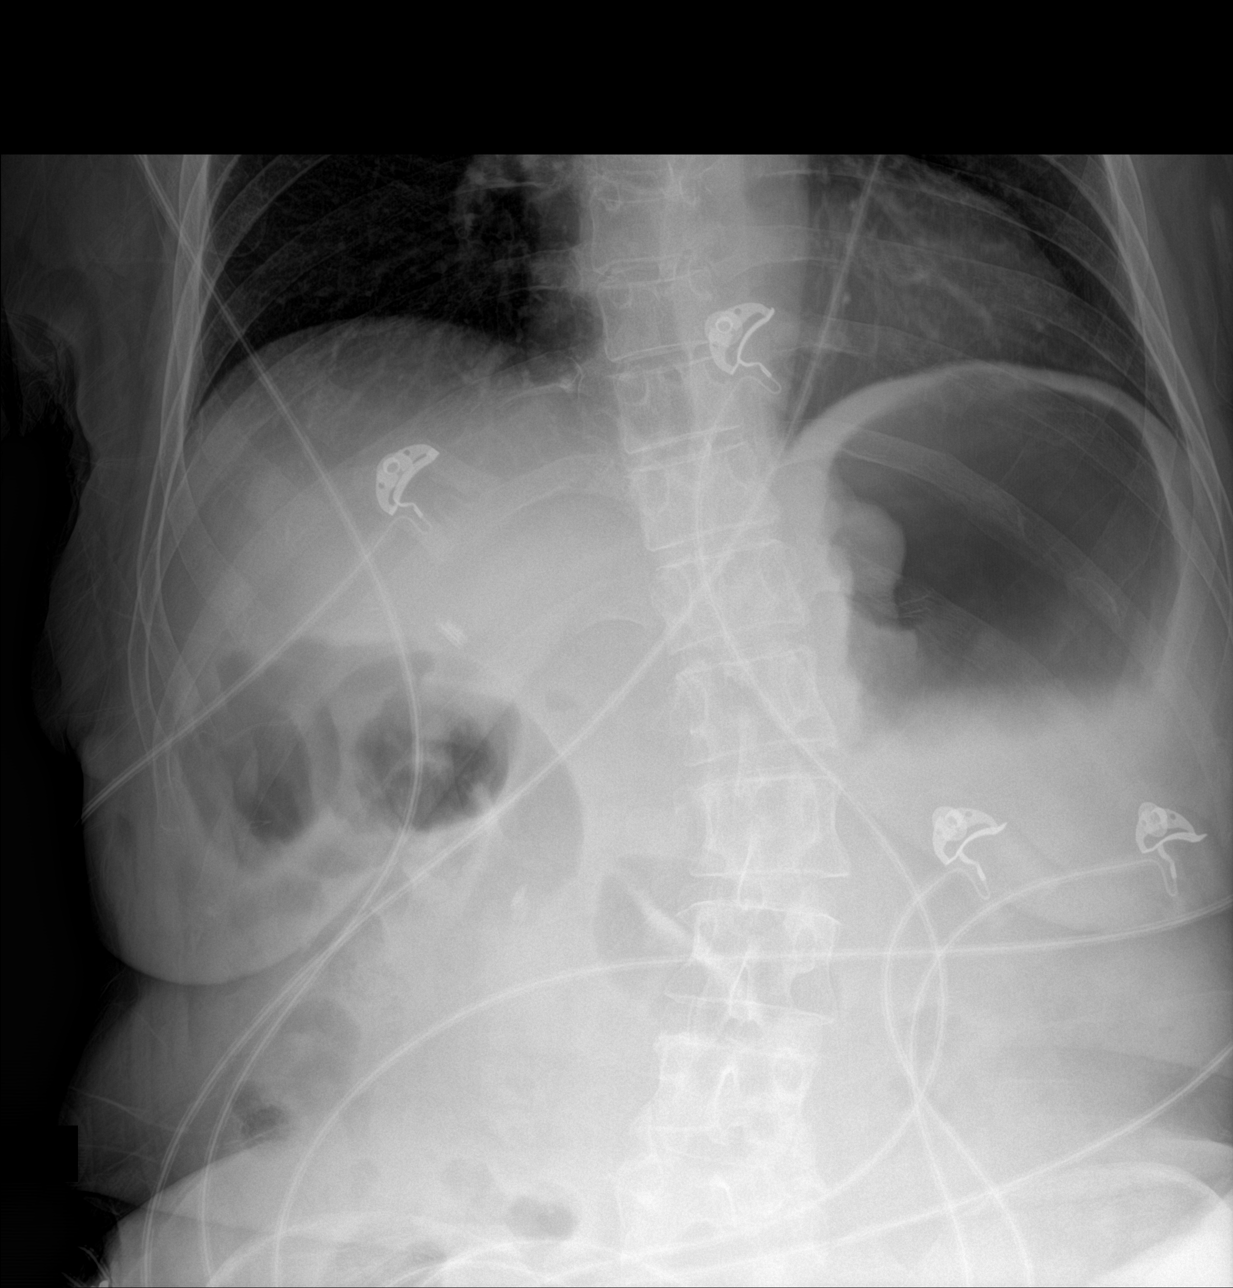

[abdomen supine (1 of 2)]
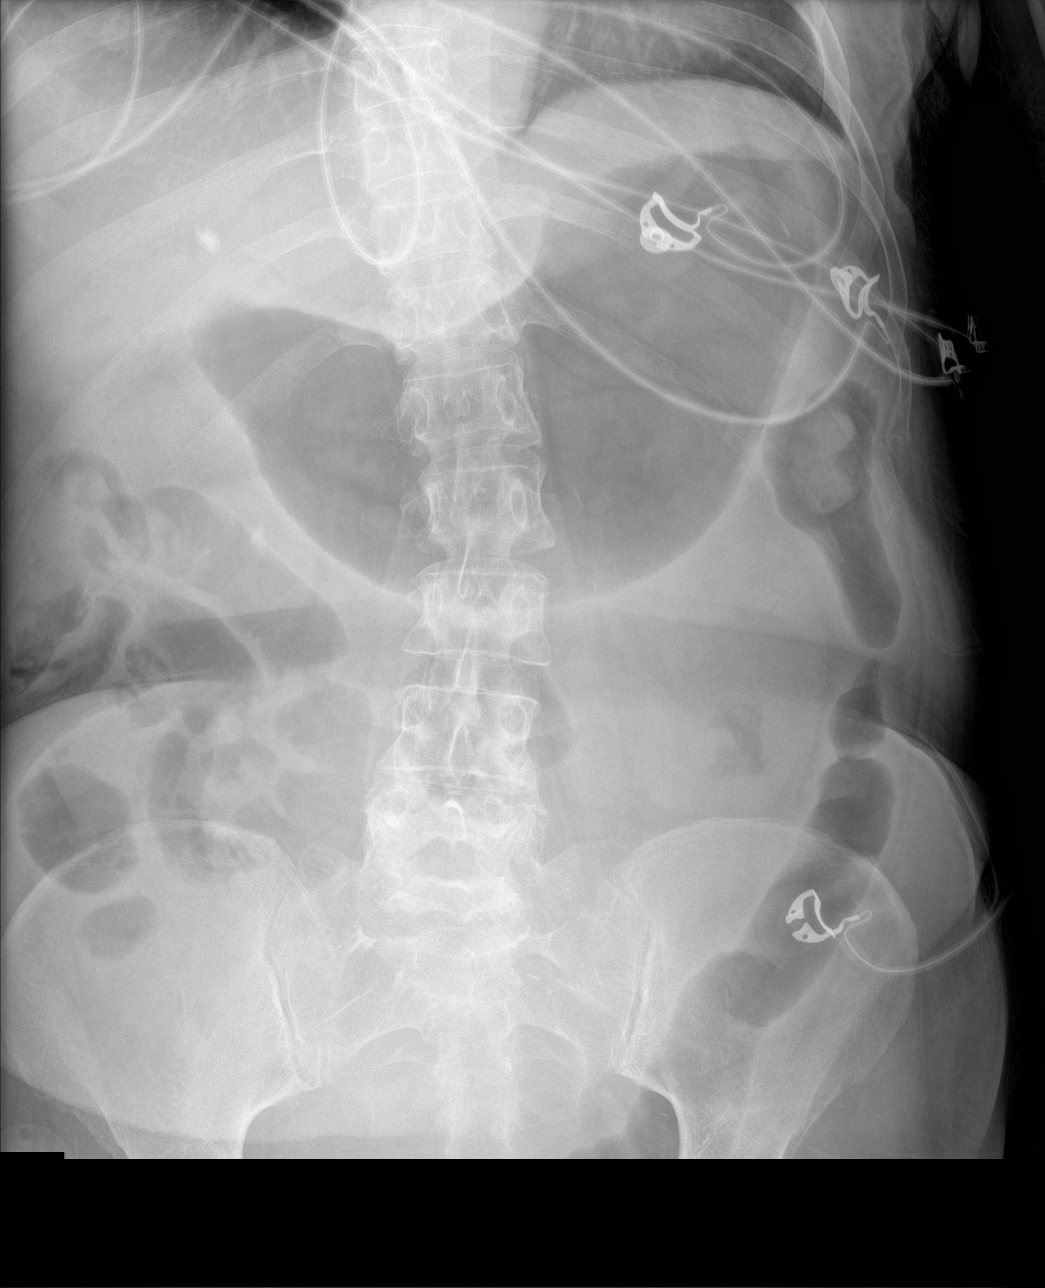

[abdomen supine (2 of 2)]
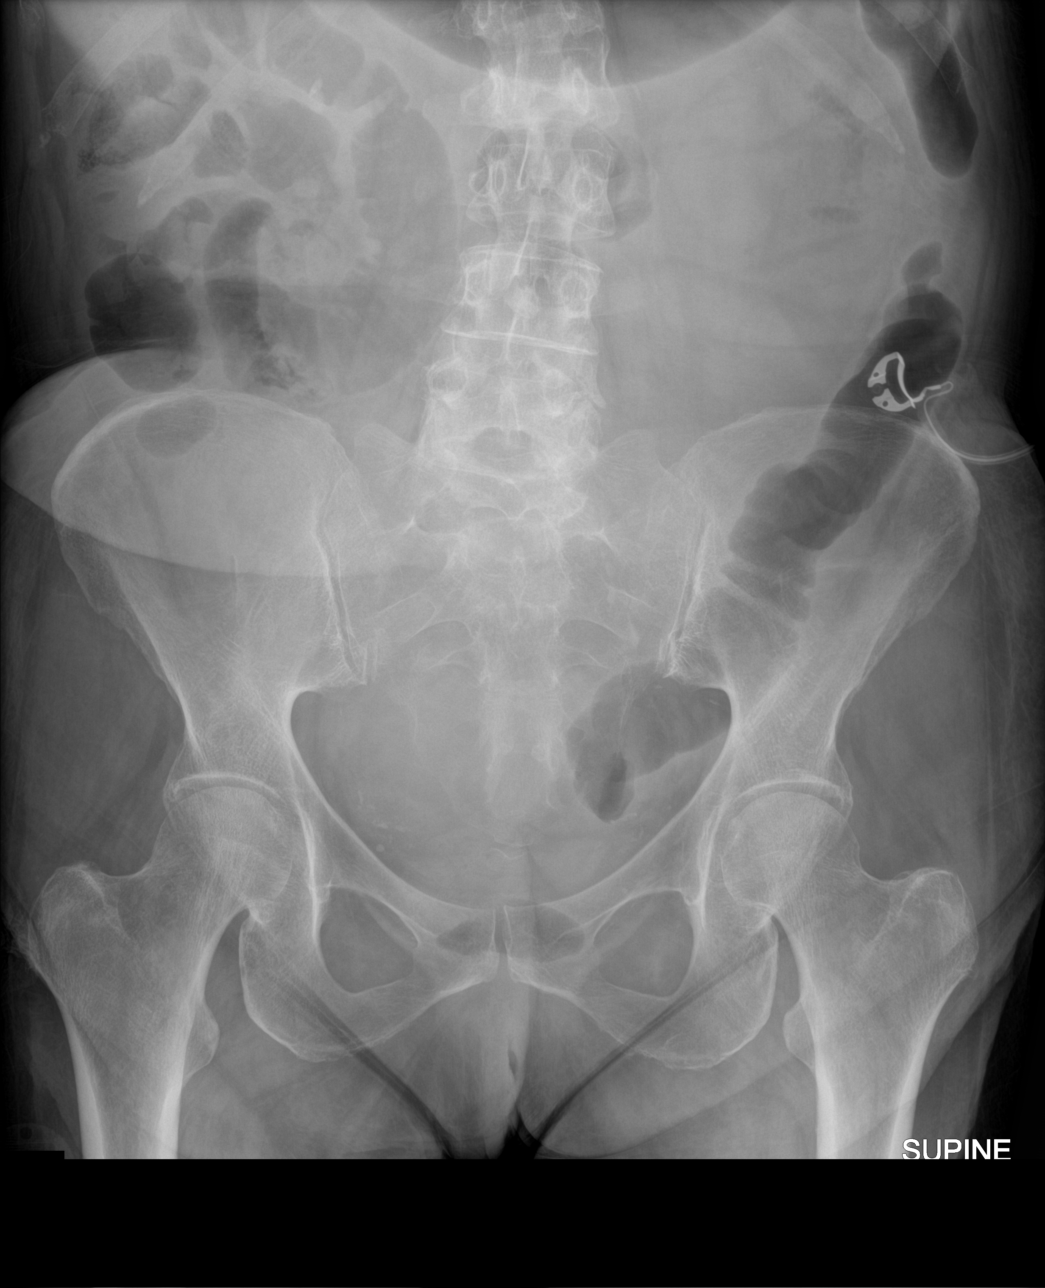

[4 of 4 positions shown; findings below may reference images not displayed]

FINDINGS: Upright AP view of the chest. Improved lung volumes and resolved
left lung base effusion and opacity. No pneumothorax or
pneumoperitoneum. Normal cardiac size and mediastinal contours. No
new pulmonary opacity.

Moderate gaseous distension of the stomach. Mild paucity of bowel
gas elsewhere, and somewhat featureless appearance of gas-filled
loops. There is gas throughout nondilated descending and proximal
sigmoid colon. S-shaped scoliosis. No acute osseous abnormality
identified. Stable cholecystectomy clips.
IMPRESSION: 1. No abdominal free air. Moderately distended stomach with
nonobstructed bowel-gas pattern otherwise. The somewhat featureless
appearance of bowel loops however might reflect ileus.
2. Resolved left pleural effusion and airspace disease since [REDACTED].
No acute cardiopulmonary abnormality.

## 2017-11-08 IMAGING — CT CT ABD-PELV W/ CM
1 of 3 series · 13 of 32 positions shown, 18 images · IV contrast (iopamidol)
Comparison: Abdominal series today as well as prior CT of the
abdomen and pelvis on 11/14/2015.

CLINICAL DATA: Abdominal pain, nausea, vomiting and diarrhea.

EXAM:
CT ABDOMEN AND PELVIS WITH CONTRAST
TECHNIQUE: Multidetector CT imaging of the abdomen and pelvis was performed
using the standard protocol following bolus administration of
intravenous contrast.
CONTRAST:  100mL [id number] IOPAMIDOL ([id number]) INJECTION 61%

[Series 2: routine abd pel with · axial · 0.73mm/px · z∈[-361,+94]mm · 13 of 105 slices shown, 18 images]
[im 7/105  soft-tissue]
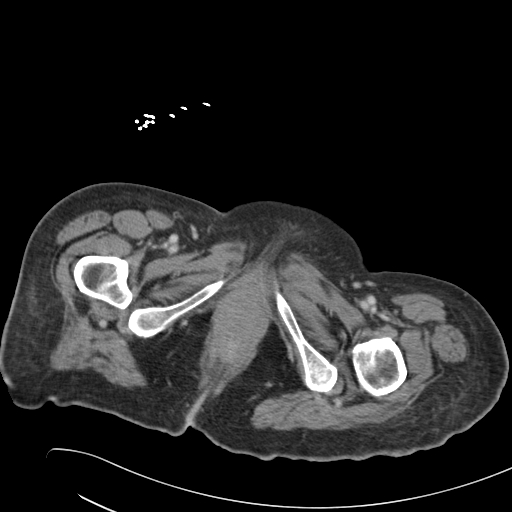
[im 7/105  bone]
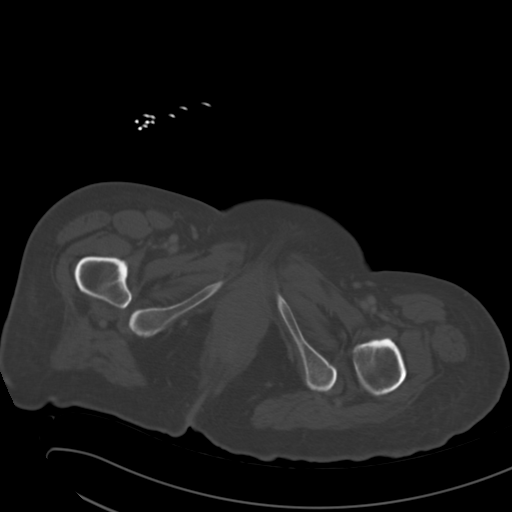
[im 14/105  soft-tissue]
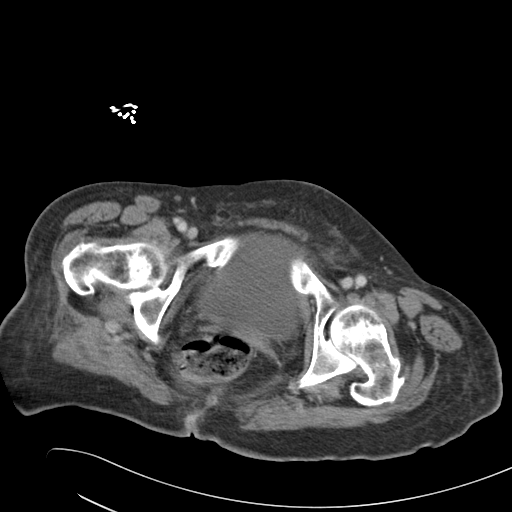
[im 27/105  soft-tissue]
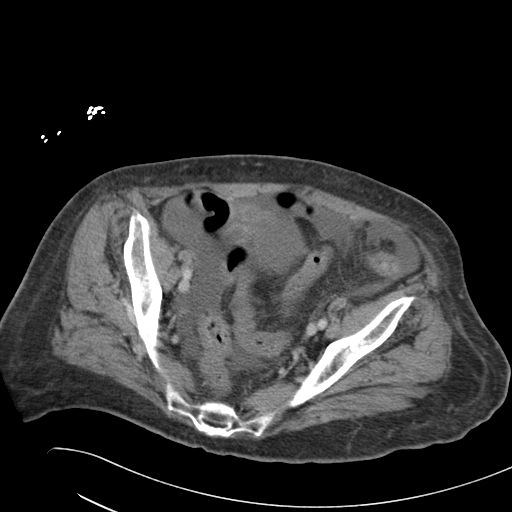
[im 33/105  soft-tissue]
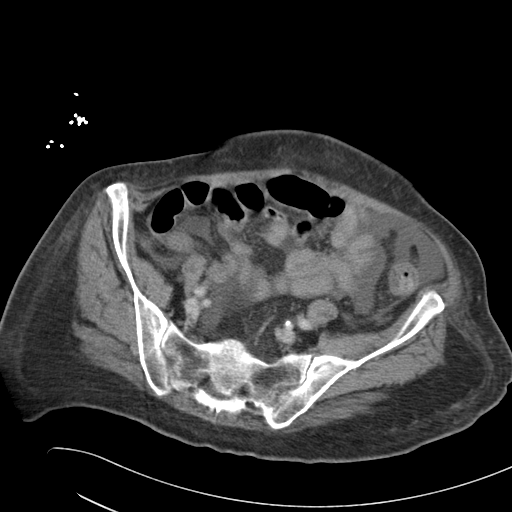
[im 40/105  soft-tissue]
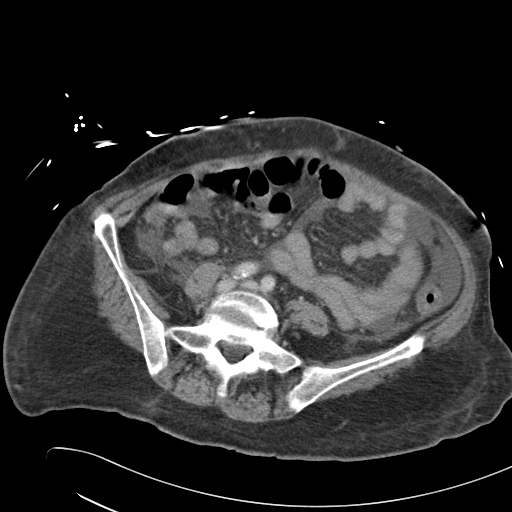
[im 46/105  soft-tissue]
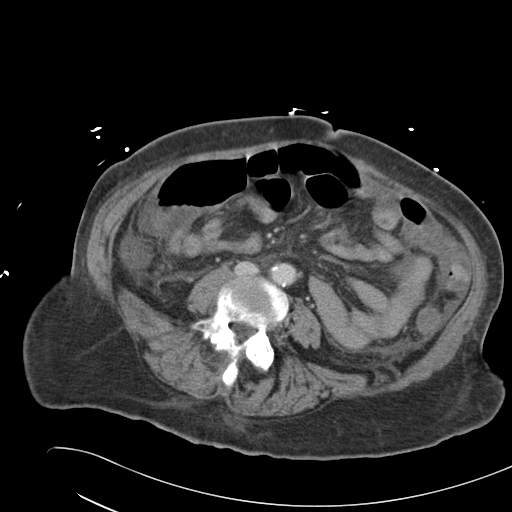
[im 59/105  soft-tissue]
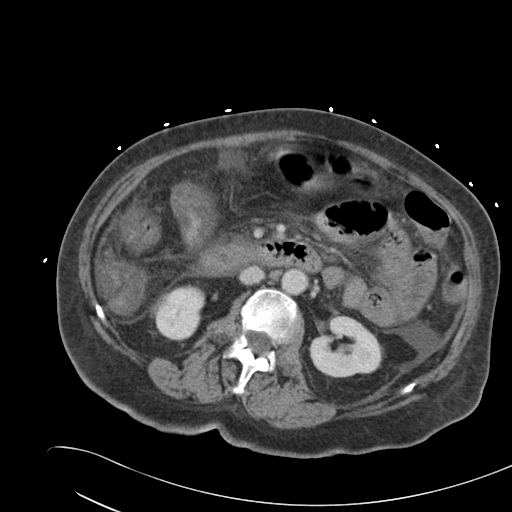
[im 66/105  soft-tissue]
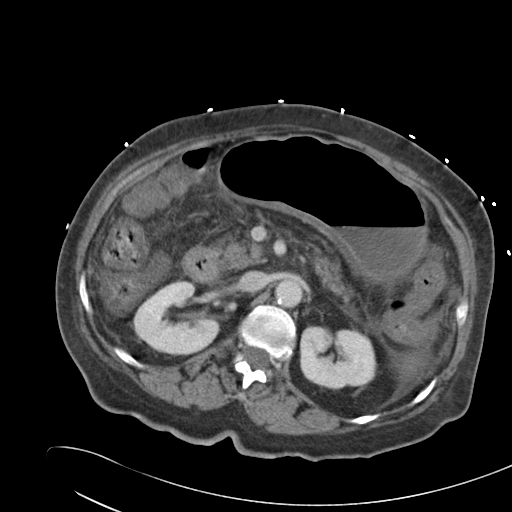
[im 72/105  soft-tissue]
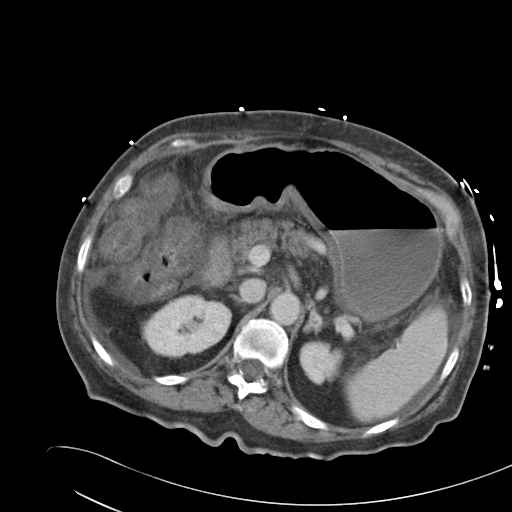
[im 72/105  bone]
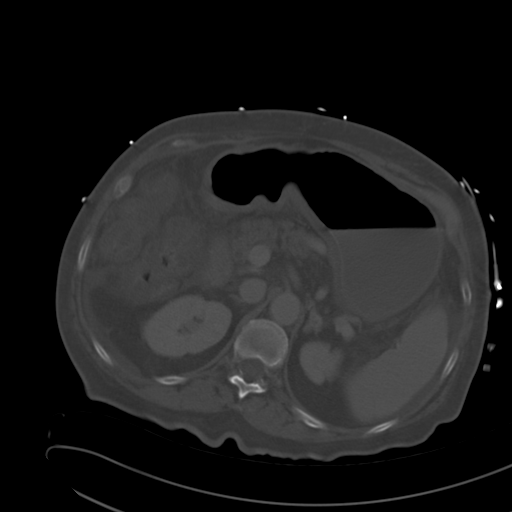
[im 79/105  soft-tissue]
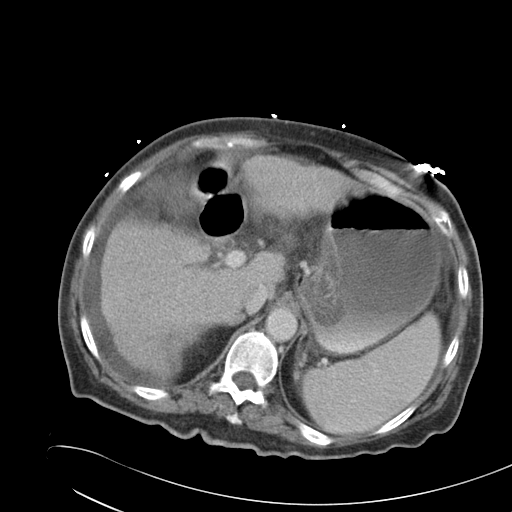
[im 79/105  lung]
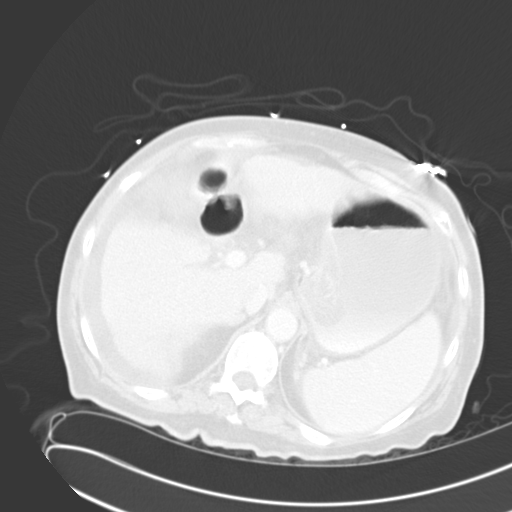
[im 85/105  lung]
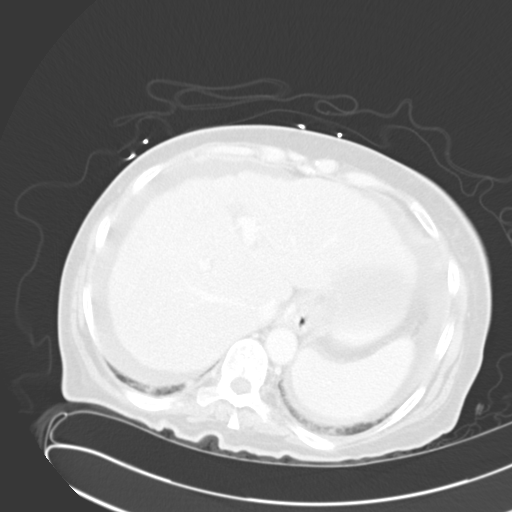
[im 92/105  soft-tissue]
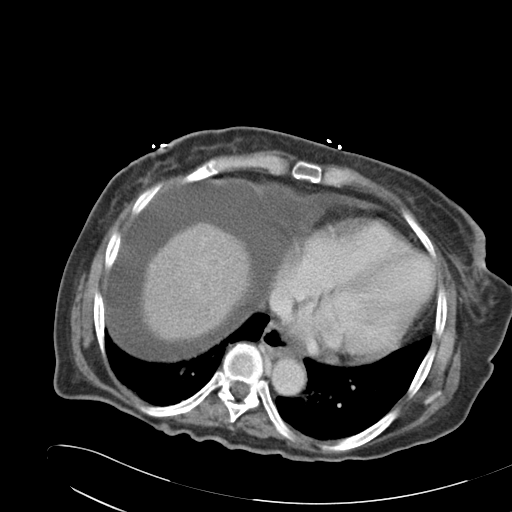
[im 92/105  lung]
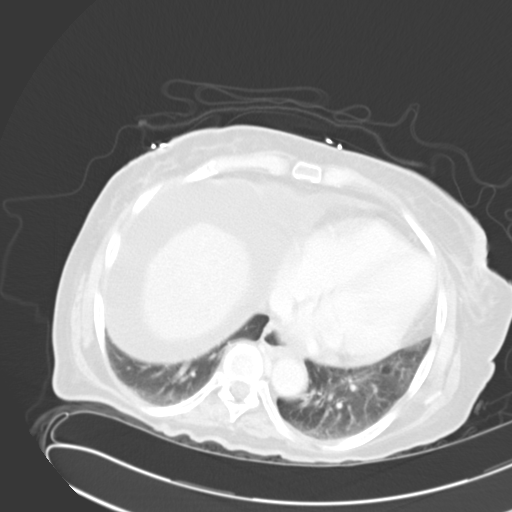
[im 98/105  soft-tissue]
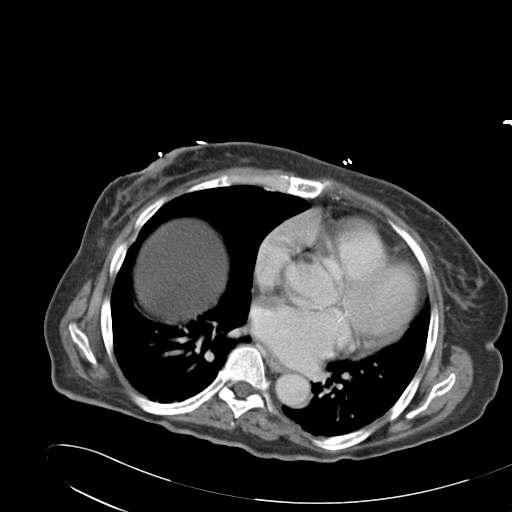
[im 98/105  lung]
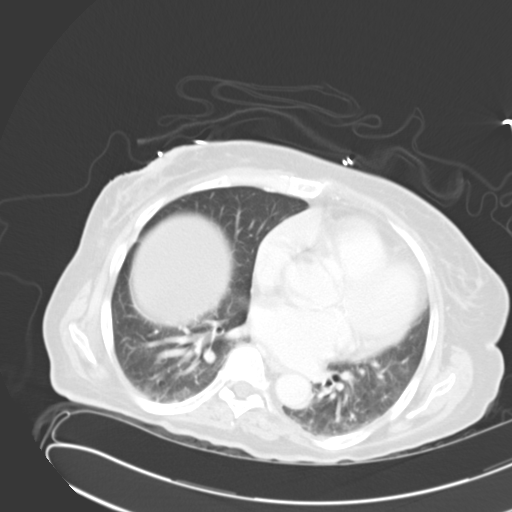

[13 of 32 positions shown; findings below may reference images not displayed]

FINDINGS: Lower chest: The visualized lung bases show bibasilar atelectasis.
No pleural fluid identified.

Hepatobiliary: There again is evidence of cirrhosis with grossly
nodular appearance of the liver. There is associated ascites
surrounding the liver and also elsewhere in the peritoneal cavity.
Overall volume of ascites is small to moderate. There is evidence of
portal hypertension with distal esophageal and gastroesophageal
varices identified supplied by the left gastric vein as well as
probable supply from short gastric veins and a gastro-renal shunt
emanating from the left renal vein.

Pancreas: No mass, inflammatory changes, or other significant
abnormality.

Spleen: Within normal limits in size and appearance.

Adrenals/Urinary Tract: No evidence of hydronephrosis.
Nonobstructing calculus in the lower pole of the right kidney
present measuring approximately 10 mm in greatest diameter.

Stomach/Bowel: There is evidence of new colitis with significant
circumferential thickening involving the ascending and transverse
colon. Colonic thickening appears extends just into the proximal
descending colon. No evidence of bowel perforation or focal abscess.
No small bowel dilatation or obstruction. The stomach continues to
show evidence of prominent folds at the level of the gastric cardia
and GE junction extending into the body of the stomach. Prominence
at the level of the gastric cardia is more pronounced compared to
prior studies and it may help to correlate this finding with repeat
EGD.

Vascular/Lymphatic: No pathologically enlarged lymph nodes. No
evidence of abdominal aortic aneurysm.

Reproductive: The uterus has been removed.

Other: No hernias identified.

Musculoskeletal: Bony structures show stable spondylosis at L5-S1 a
mild leftward convex scoliosis of the lumbar spine.
IMPRESSION: 1. New colitis involving the ascending and transverse colon. No
evidence of bowel perforation, focal abscess or small bowel
obstruction.
2. Cirrhosis and portal hypertension with distal esophageal and
gastroesophageal varices identified supplied by probable left
gastric and gastro-renal shunts.
3. Some increased prominence of folds at the level of the gastric
cardia extending into the body of the stomach. Some of these changes
were apparently secondary to prior fundoplication and gastric
inflammation by prior EGD. Correlation with repeat EGD may be
helpful, especially given the CT evidence of definite distal
esophageal varices.

## 2017-11-09 IMAGING — CT CT CHEST W/O CM
1 series · 15 of 34 positions shown, 19 images · non-contrast
Comparison: Chest x-rays dated 12/17/2015 and 11/14/2015.

CLINICAL DATA: Diagnosed with pneumonia 1 month ago. Coughing and
wheezing has increased over the last couple days. Shortness of
breath today.

EXAM:
CT CHEST WITHOUT CONTRAST
TECHNIQUE: Multidetector CT imaging of the chest was performed following the
standard protocol without IV contrast.

[Series 2: routine chest wo · axial · 0.68mm/px · z∈[+230,+478]mm · 15 of 146 slices shown, 19 images]
[im 11/146  mediastinal]
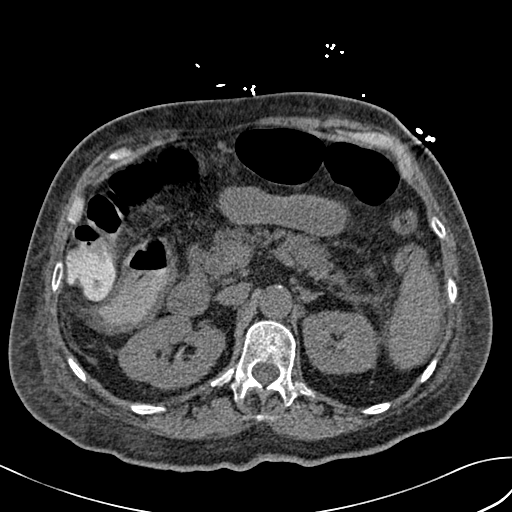
[im 11/146  lung]
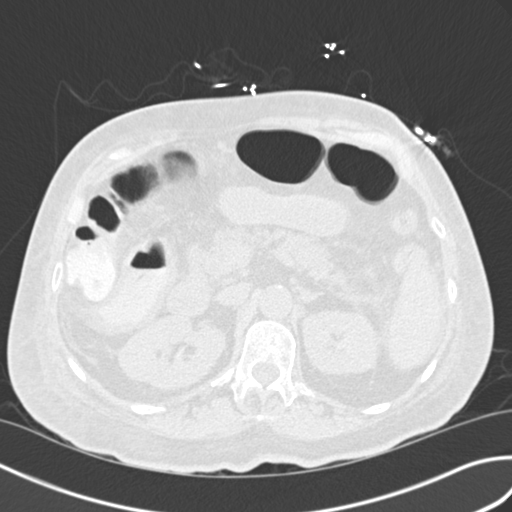
[im 22/146  lung]
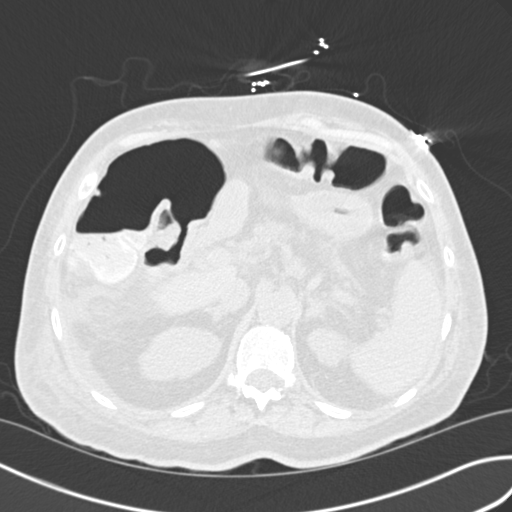
[im 30/146  lung]
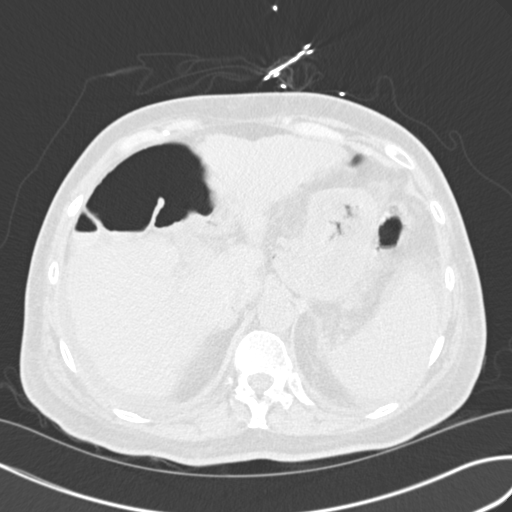
[im 38/146  lung]
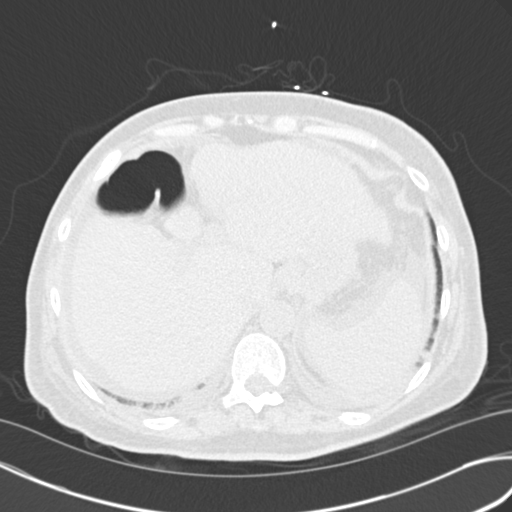
[im 49/146  mediastinal]
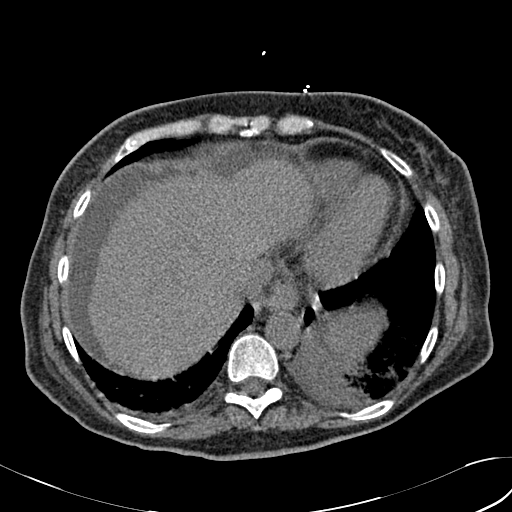
[im 49/146  lung]
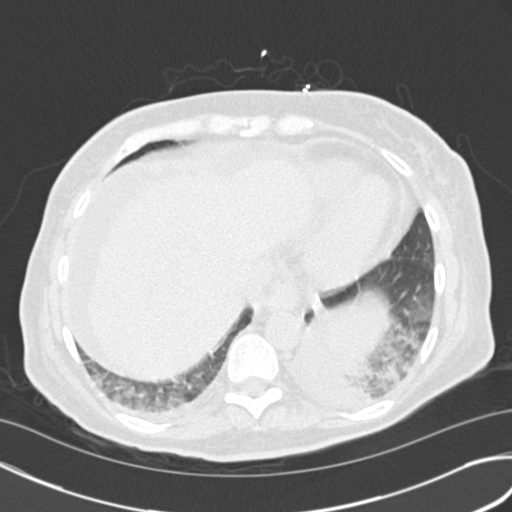
[im 59/146  lung]
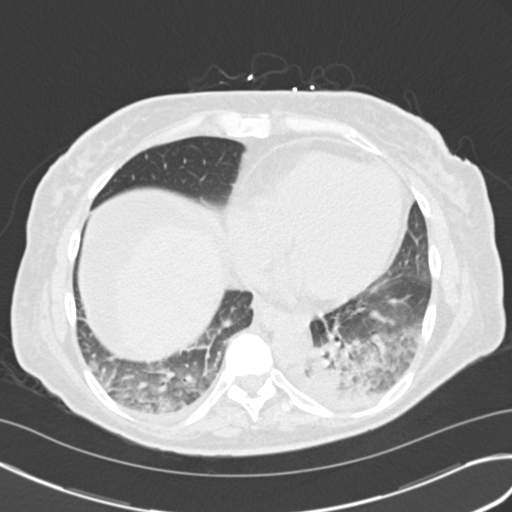
[im 65/146  lung]
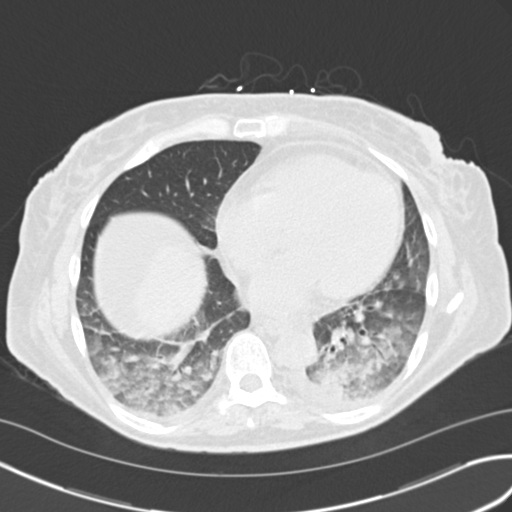
[im 76/146  lung]
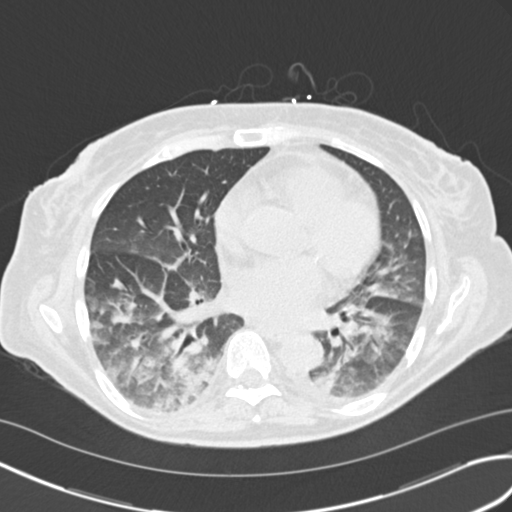
[im 81/146  mediastinal]
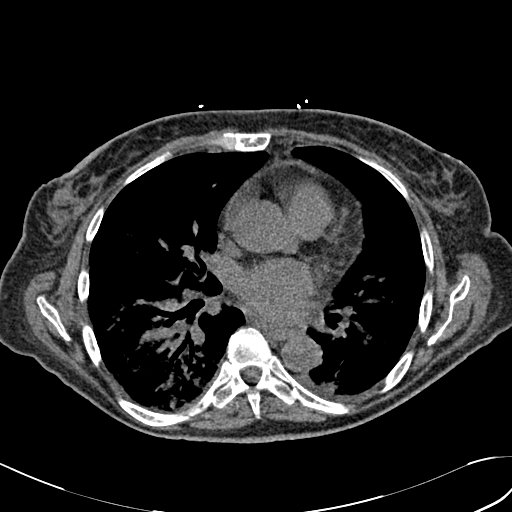
[im 81/146  lung]
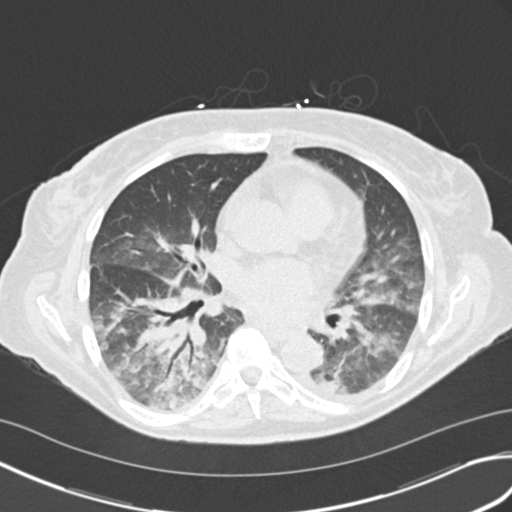
[im 88/146  lung]
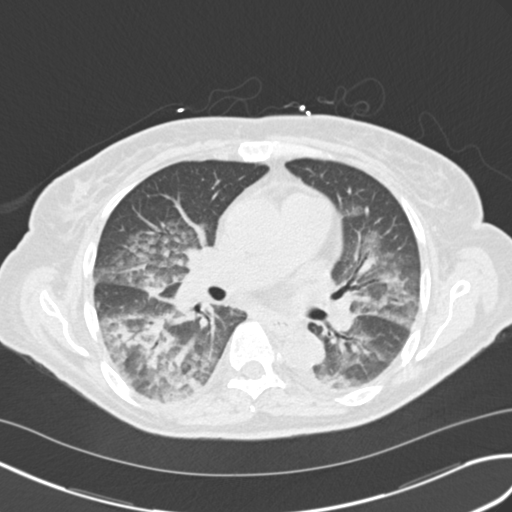
[im 97/146  lung]
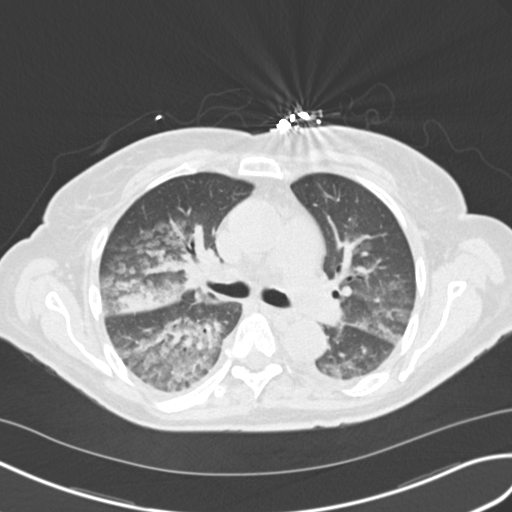
[im 108/146  lung]
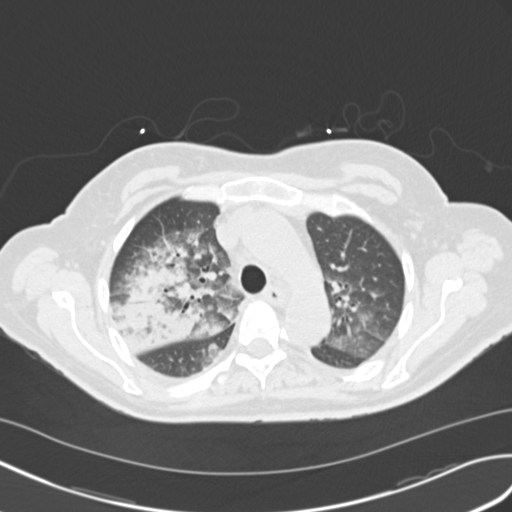
[im 117/146  mediastinal]
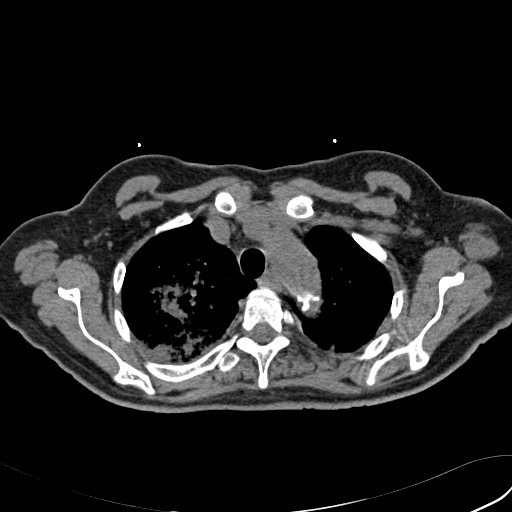
[im 117/146  lung]
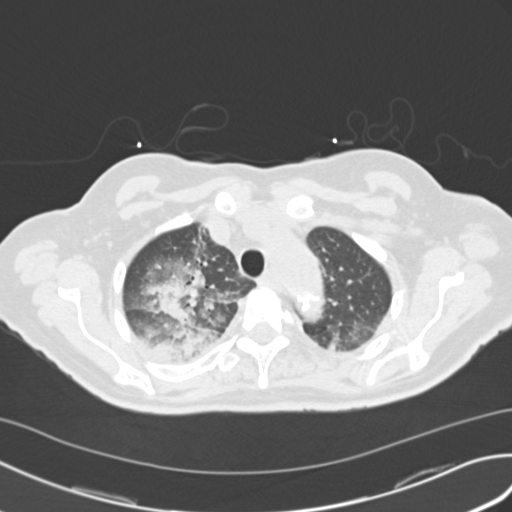
[im 124/146  lung]
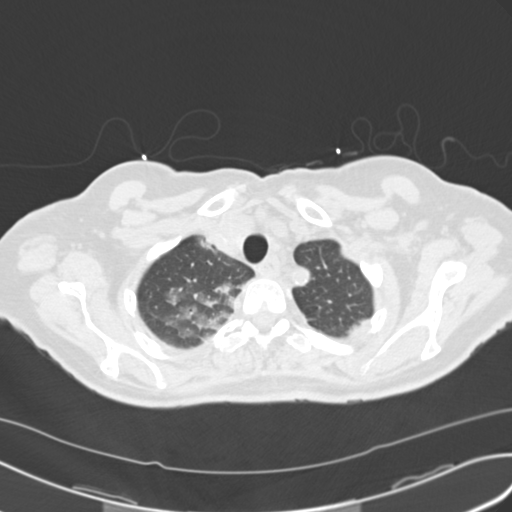
[im 135/146  lung]
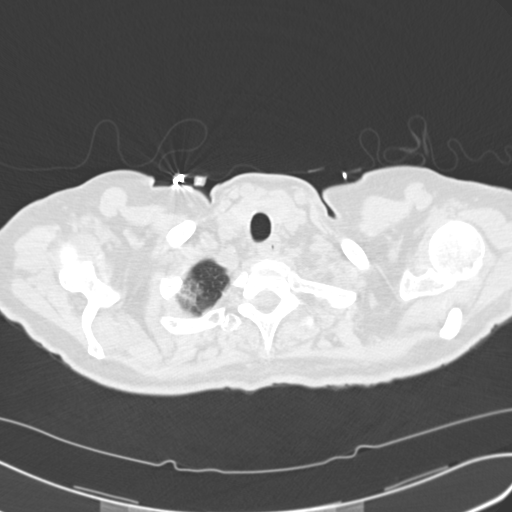

[15 of 34 positions shown; findings below may reference images not displayed]

FINDINGS: Mediastinum/Lymph Nodes: Heart size is upper normal. No pericardial
effusion. Scattered coronary artery calcifications noted. No masses
or enlarged lymph nodes seen within the mediastinum or perihilar
regions.

Lungs/Pleura: Patchy consolidations throughout both lungs, involving
all lobes, most dense within the right upper lobe and at the left
lung base posteriorly. Majority of the consolidations are associated
with ground-glass opacities suggesting atypical pneumonia or
associated edema.

Trachea and central bronchi are unremarkable.

Upper abdomen: Free ascites is seen within the upper abdomen. Liver
appears cirrhotic. Patient is status post cholecystectomy.
Thickening of the walls of the upper right colon appear similar to
the appearance on CT abdomen of 12/16/2015 compatible with the
description of colitis on the associated report.

Musculoskeletal: Mild scoliosis of the thoracic spine. No evidence
of acute osseous abnormality. Superficial soft tissues are
unremarkable.
IMPRESSION: 1. Multi lobar consolidations, most dense within the right upper
lobe and at the left lung base posteriorly, with areas of associated
ground-glass opacity suggesting atypical pneumonia and/or edema.
Differential includes atypical pneumonias such as viral or fungal,
interstitial pneumonias, edema related to volume overload/CHF,
chronic interstitial diseases, hypersensitivity pneumonitis, and
respiratory bronchiolitis.
2. Upper abdominal ascites, presumably associated with liver
cirrhosis. Thickening of the walls of the upper ascending colon
similar to its appearance on CT abdomen of 12/16/2015, compatible
with the previous description of colitis on the earlier CT report.

## 2017-11-09 IMAGING — CR DG CHEST 2V
2 series · 2 of 2 positions shown · non-contrast
Comparison: 12/16/2015

CLINICAL DATA: Recent onset of cough; admitted yesterday; recently
hospitalized for pneumonia; hx/o cirrhosis, htn, paf, and cancer of
uterus

EXAM:
CHEST  2 VIEW

[chest pa]
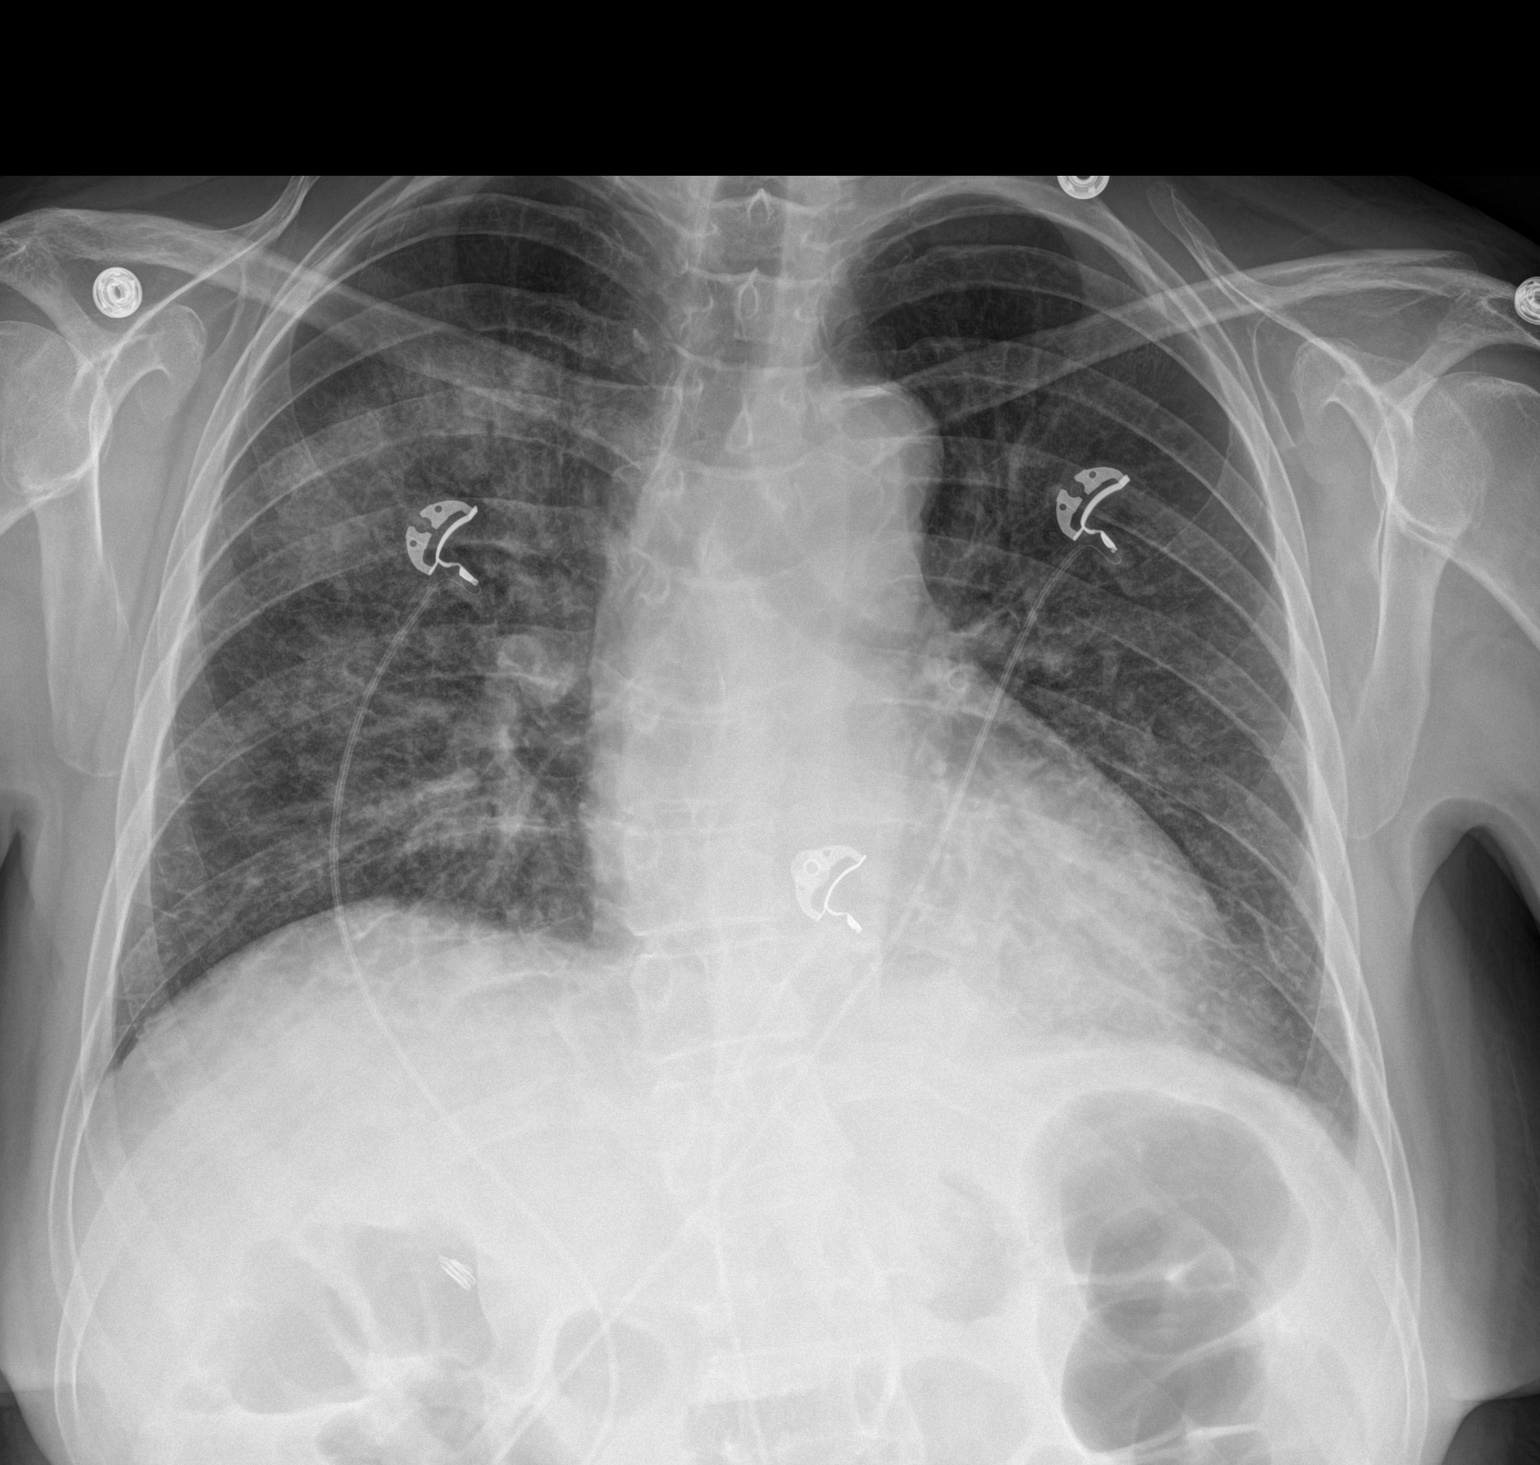

[chest lat]
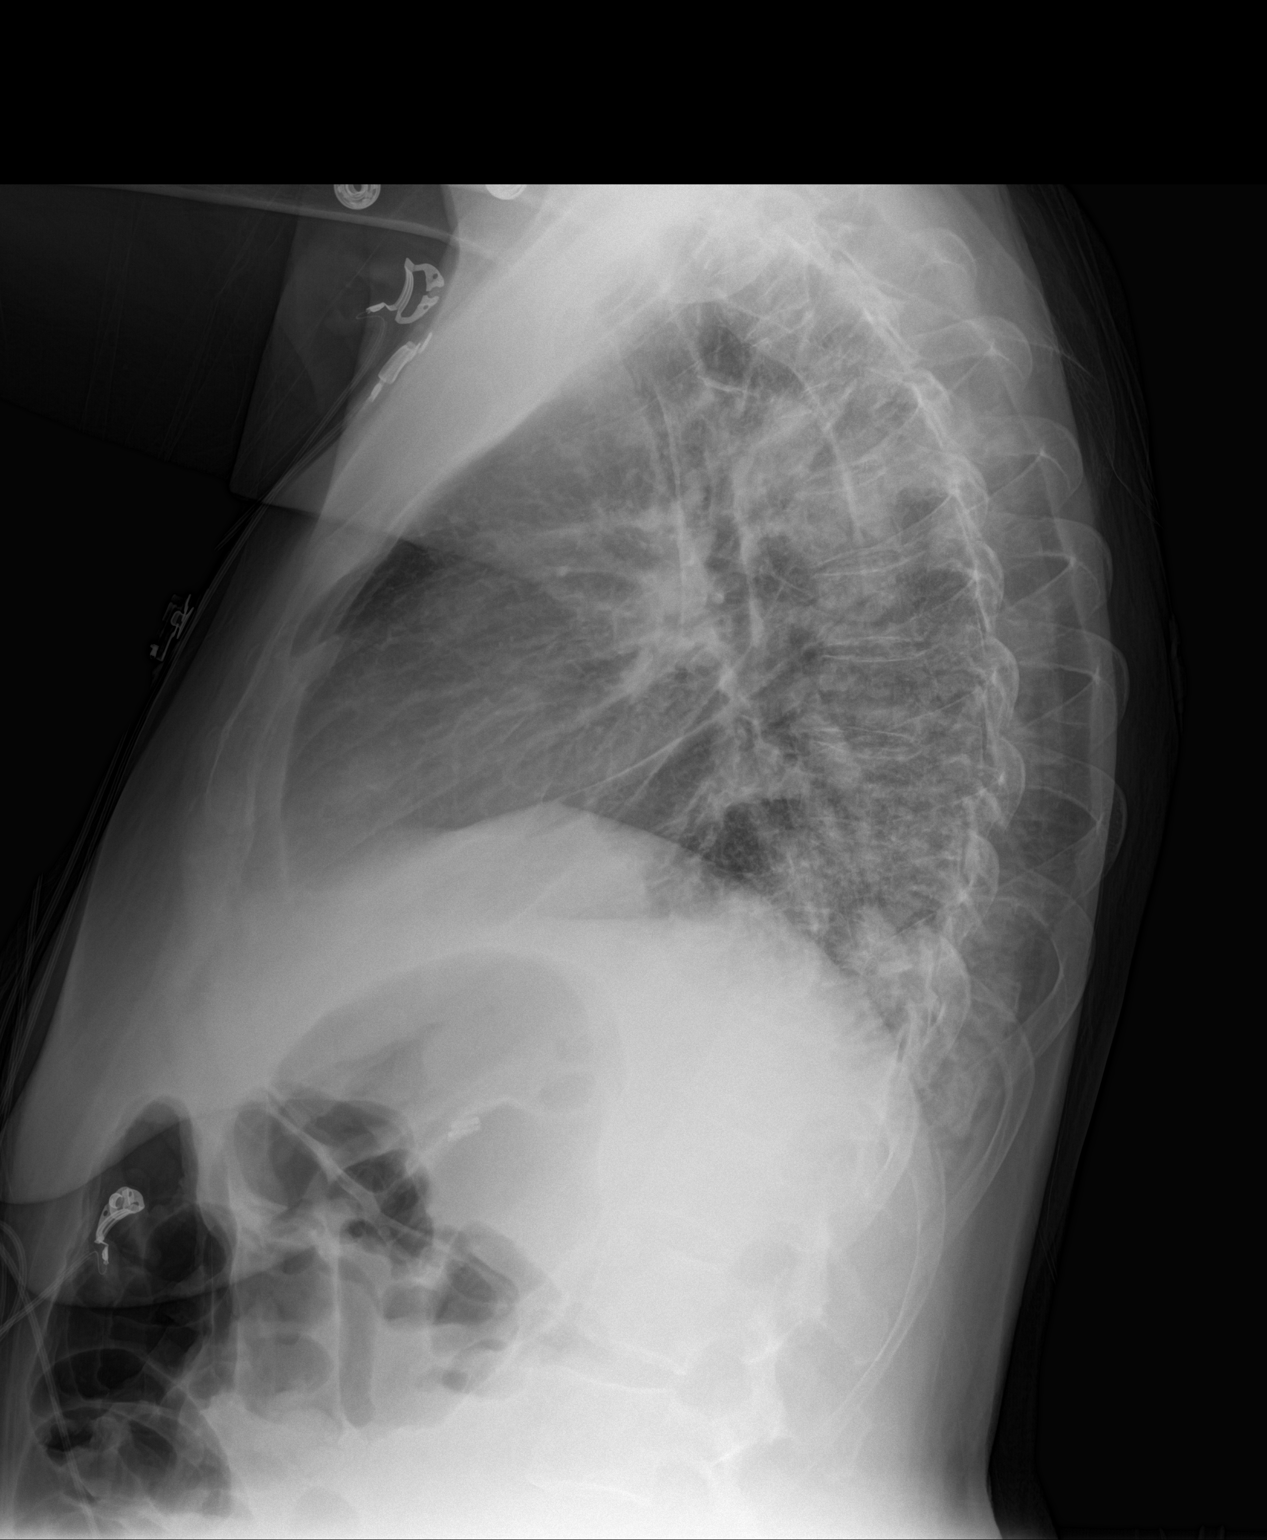

[2 of 2 positions shown; findings below may reference images not displayed]

FINDINGS: Since the previous day's study, areas of airspace opacity have
developed, most evident in the right upper lobe, but also noted in
the right lower lobe and left lower lobe.

No pleural effusion or pneumothorax.

Cardiac silhouette is normal in size and configuration. No
mediastinal or hilar masses or convincing adenopathy.

Bony thorax is intact.
IMPRESSION: 1. Multifocal pneumonia has become apparent since the previous day's
study.

## 2017-11-18 DIAGNOSIS — G894 Chronic pain syndrome: Secondary | ICD-10-CM | POA: Diagnosis not present

## 2017-11-18 DIAGNOSIS — M545 Low back pain: Secondary | ICD-10-CM | POA: Diagnosis not present

## 2017-11-18 DIAGNOSIS — Z79899 Other long term (current) drug therapy: Secondary | ICD-10-CM | POA: Diagnosis not present

## 2017-11-18 DIAGNOSIS — I10 Essential (primary) hypertension: Secondary | ICD-10-CM | POA: Diagnosis not present

## 2017-11-18 DIAGNOSIS — G8929 Other chronic pain: Secondary | ICD-10-CM | POA: Diagnosis not present

## 2017-11-18 DIAGNOSIS — M5416 Radiculopathy, lumbar region: Secondary | ICD-10-CM | POA: Diagnosis not present

## 2017-11-18 DIAGNOSIS — M5412 Radiculopathy, cervical region: Secondary | ICD-10-CM | POA: Diagnosis not present

## 2017-11-18 DIAGNOSIS — M542 Cervicalgia: Secondary | ICD-10-CM | POA: Diagnosis not present

## 2017-11-18 DIAGNOSIS — F1120 Opioid dependence, uncomplicated: Secondary | ICD-10-CM | POA: Diagnosis not present

## 2017-11-19 IMAGING — CR DG CHEST 2V
1 series · 3 of 3 positions shown · non-contrast
Comparison: Chest from abdominal radiographs earlier this day at
7233 hour

CLINICAL DATA: Weakness.  Cough.

EXAM:
CHEST  2 VIEW

[Series 1: x chest ap · 0.14mm/px · 3 of 3 slices shown]
[im 1/3]
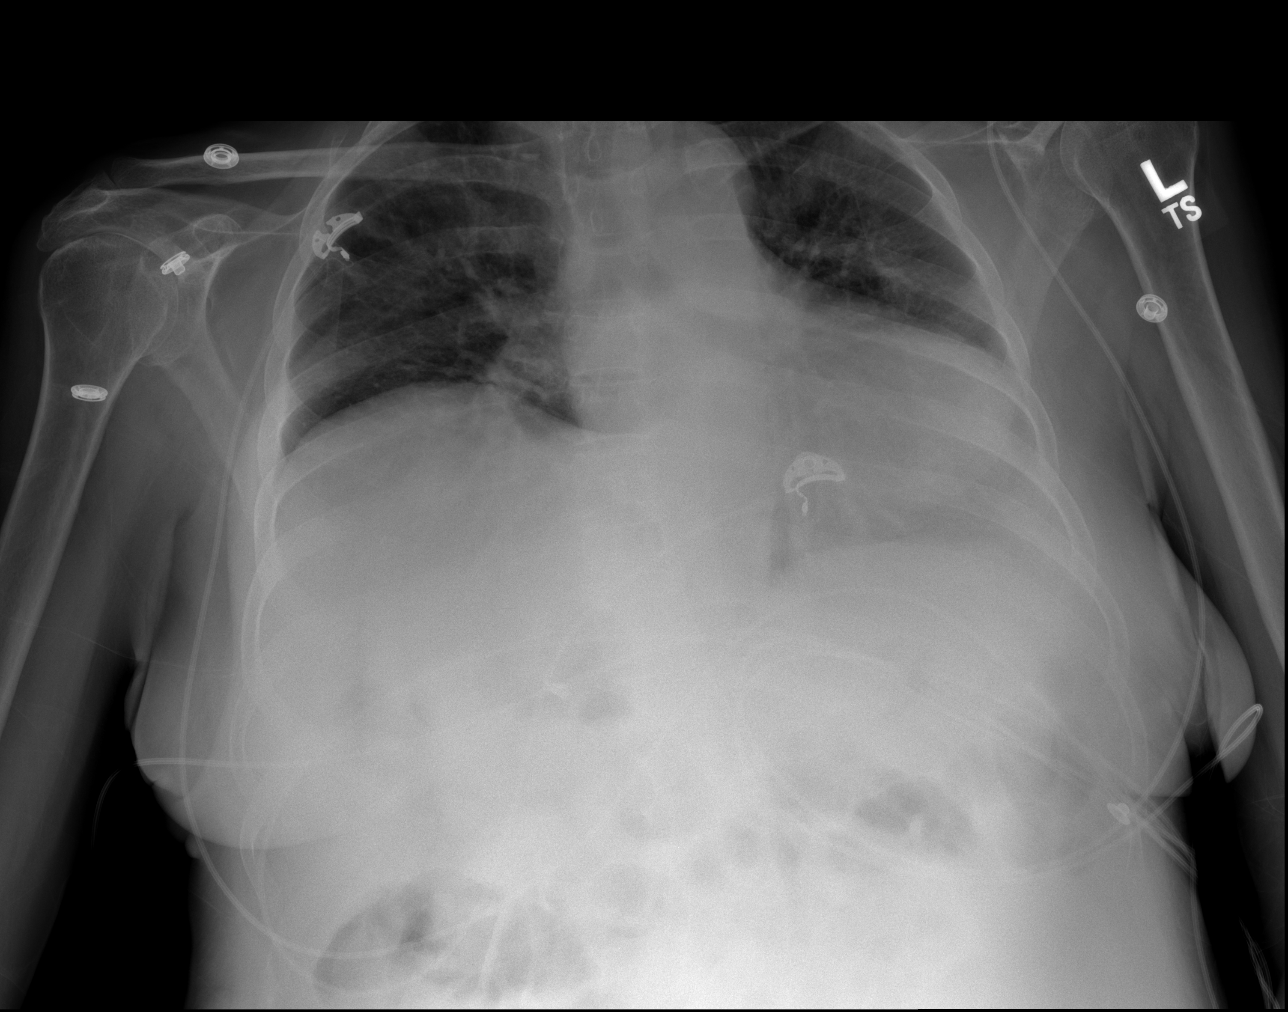
[im 2/3]
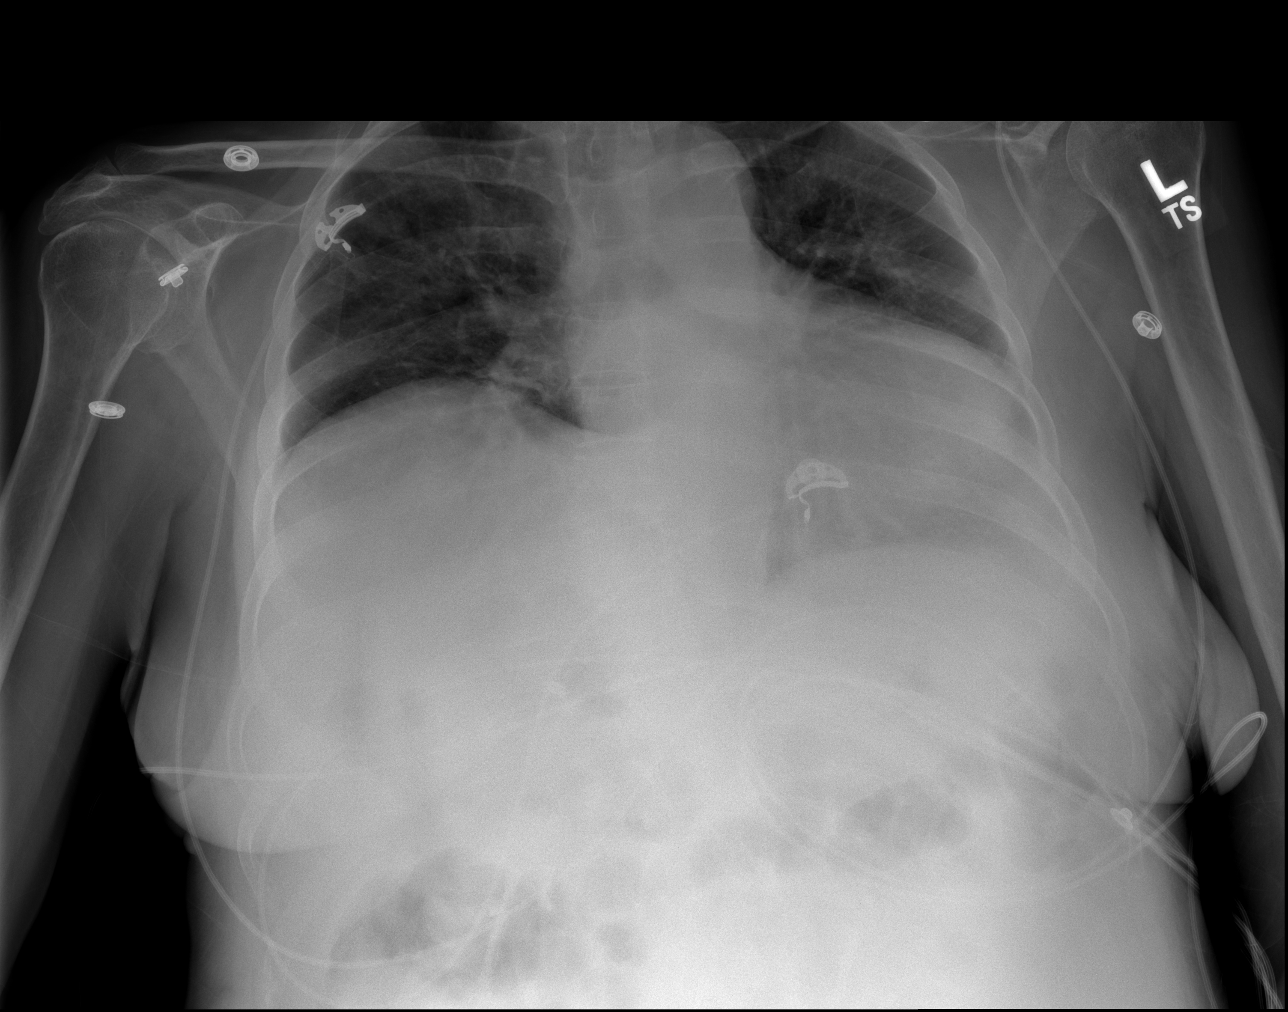
[im 3/3]
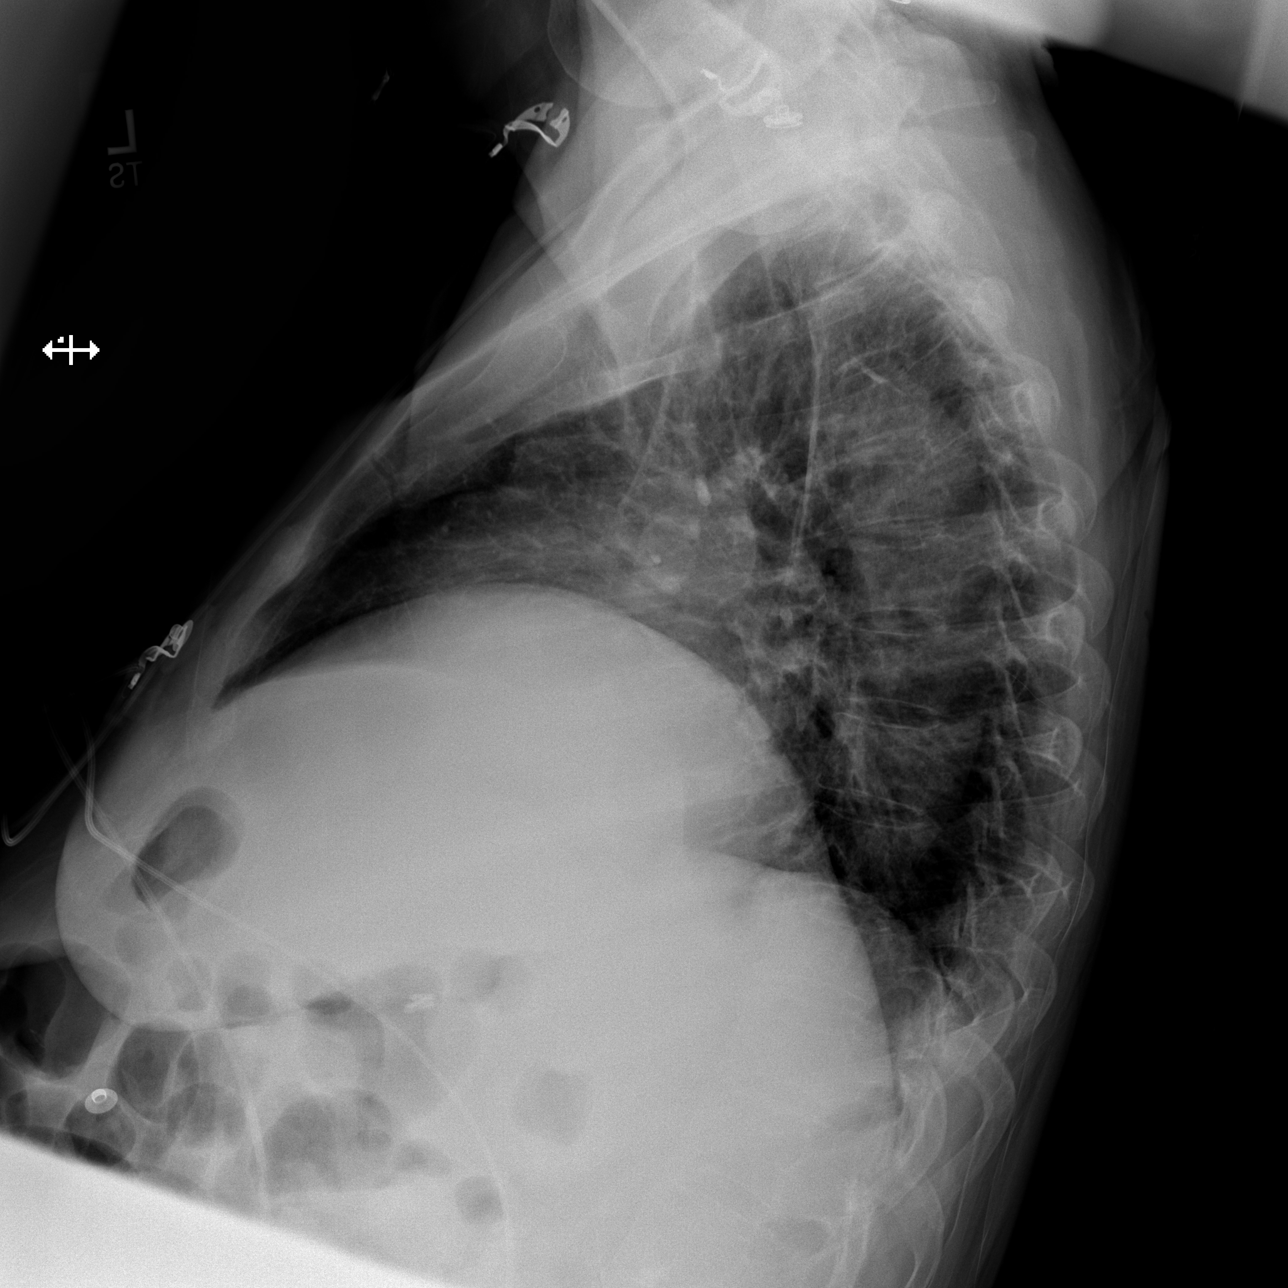

[3 of 3 positions shown; findings below may reference images not displayed]

FINDINGS: Unchanged elevation of right hemidiaphragm. There is adjacent
atelectasis or scarring at the lung bases. Cardiomediastinal
contours are unchanged with stable cardiomegaly. Probable vascular
congestion. No evidence pulmonary edema. Trace left pleural effusion
IMPRESSION: Unchanged elevation of right hemidiaphragm. Unchanged cardiomegaly.
No new abnormality is seen.

## 2017-11-19 IMAGING — CT CT HEAD W/O CM
1 series · 16 of 30 positions shown, 20 images · non-contrast
Comparison: 11/11/2015

CLINICAL DATA: Abdominal pain for 4 days, nausea, vomiting,
diarrhea, onset of LEFT-sided weakness at 8207 hours, history TIA,
atrial fibrillation, diabetes mellitus, paroxysmal atrial
fibrillation, essential hypertension, uterine cancer

EXAM:
CT HEAD WITHOUT CONTRAST
TECHNIQUE: Contiguous axial images were obtained from the base of the skull
through the vertex without intravenous contrast.

[Series 2: head wo · axial · 0.39mm/px · z∈[+428,+563]mm · 16 of 31 slices shown, 20 images]
[im 2/31  brain]
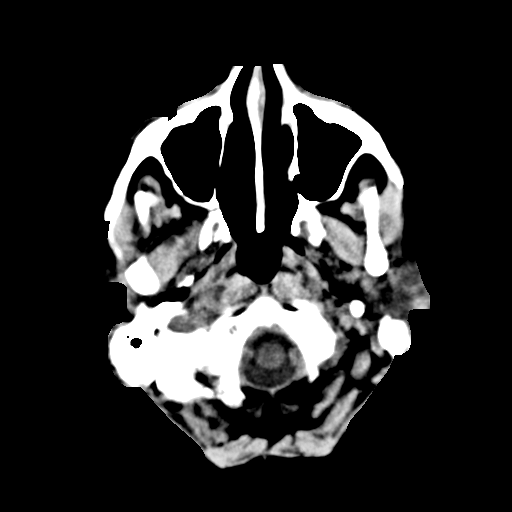
[im 2/31  bone]
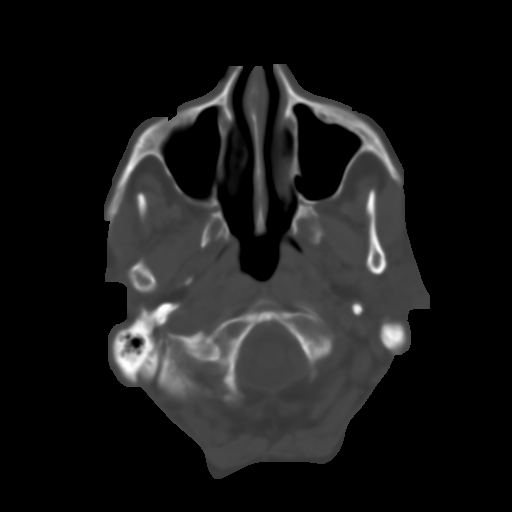
[im 4/31  brain]
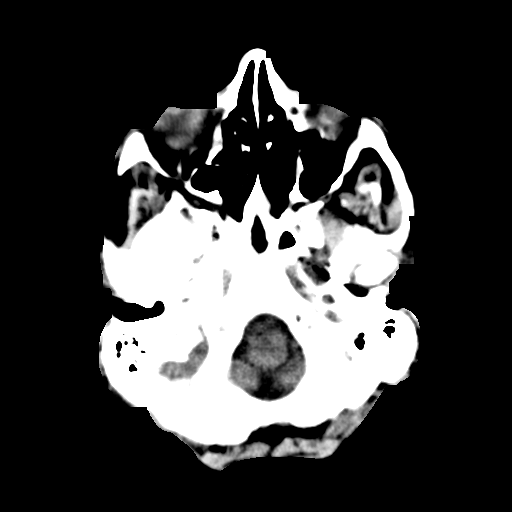
[im 6/31  brain]
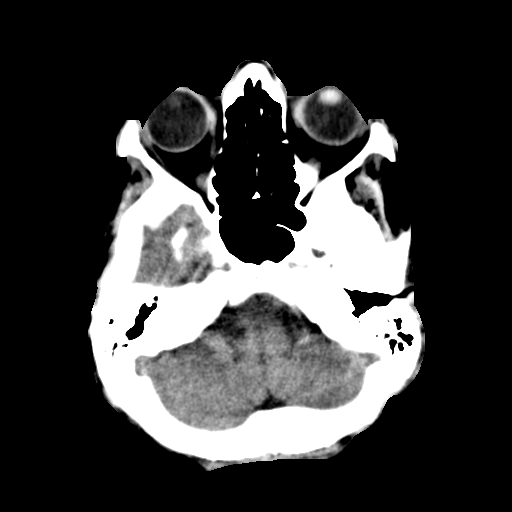
[im 8/31  brain]
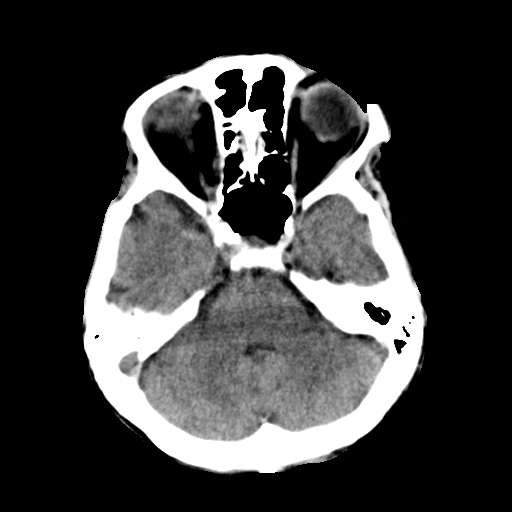
[im 9/31  brain]
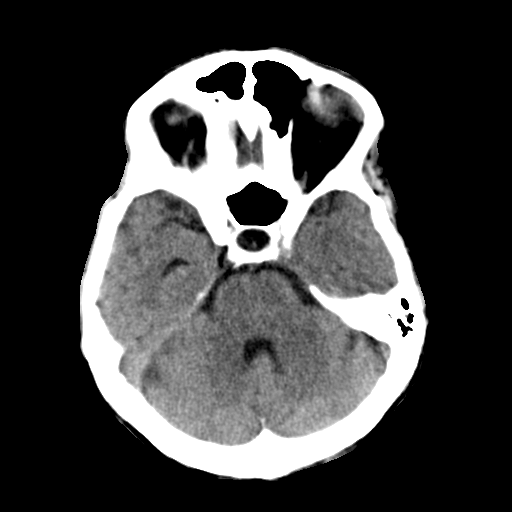
[im 9/31  bone]
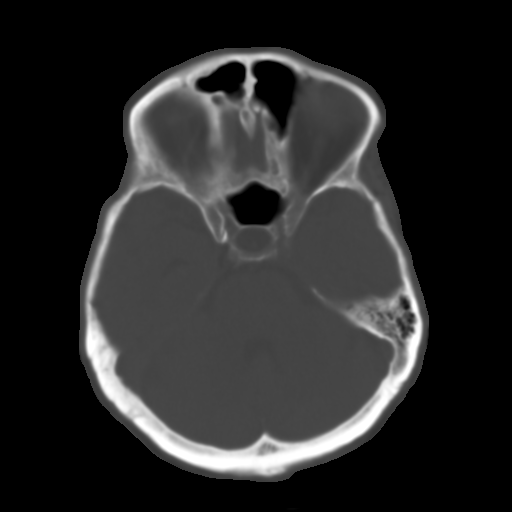
[im 11/31  brain]
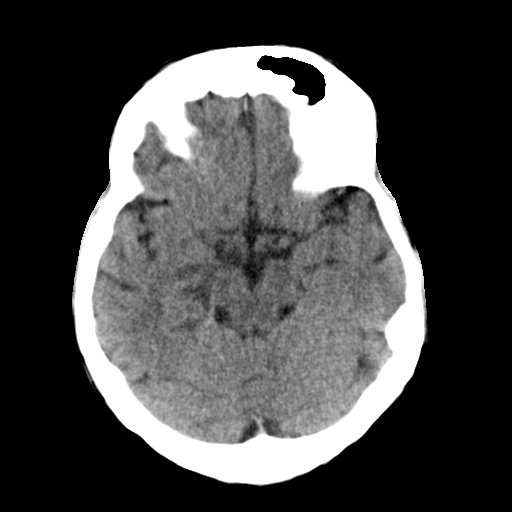
[im 13/31  brain]
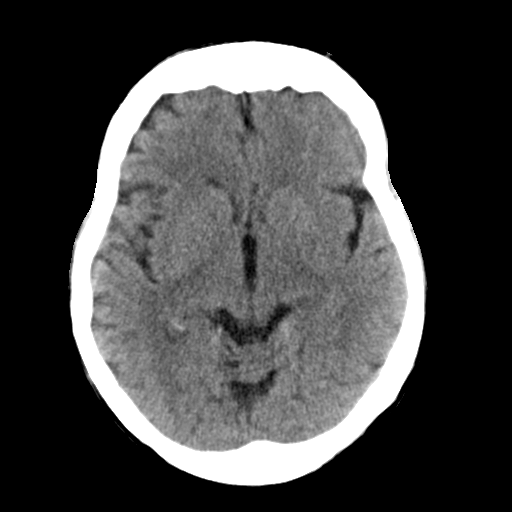
[im 15/31  brain]
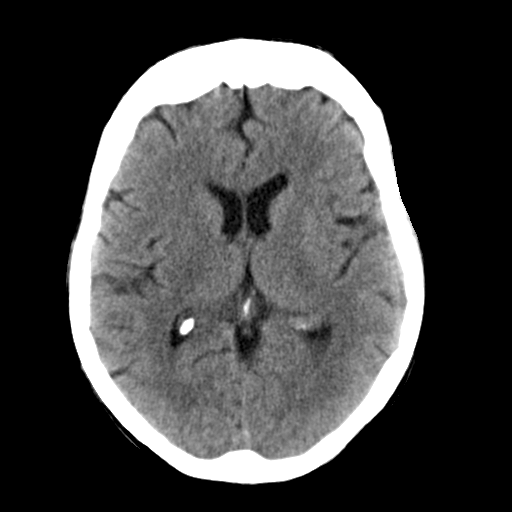
[im 16/31  brain]
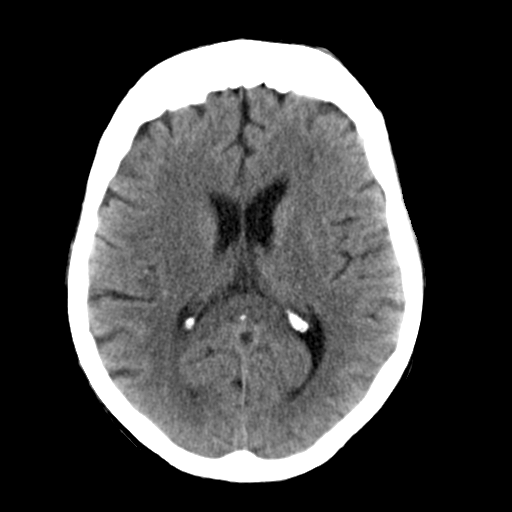
[im 16/31  bone]
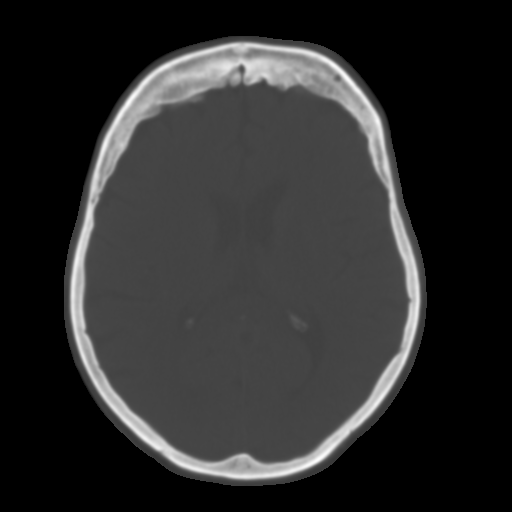
[im 18/31  brain]
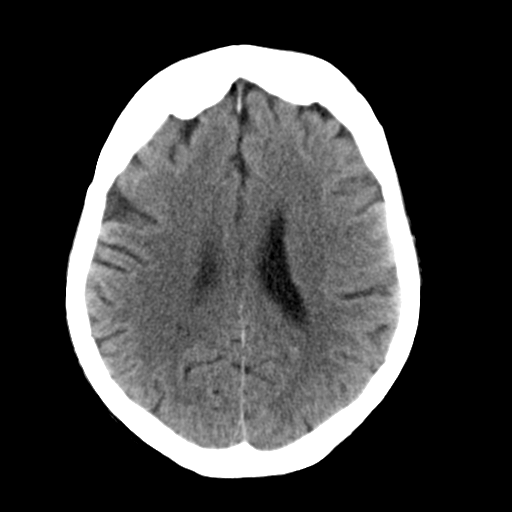
[im 20/31  brain]
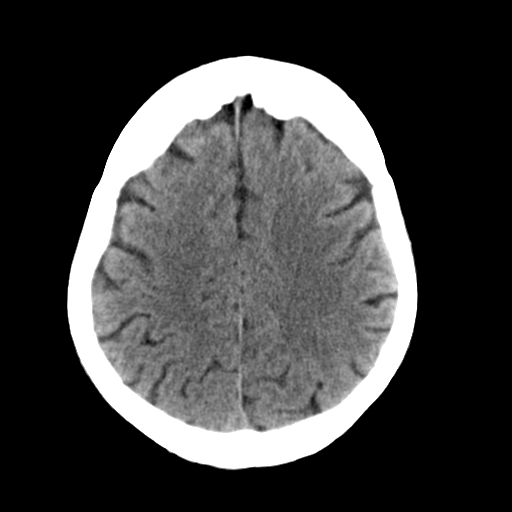
[im 22/31  brain]
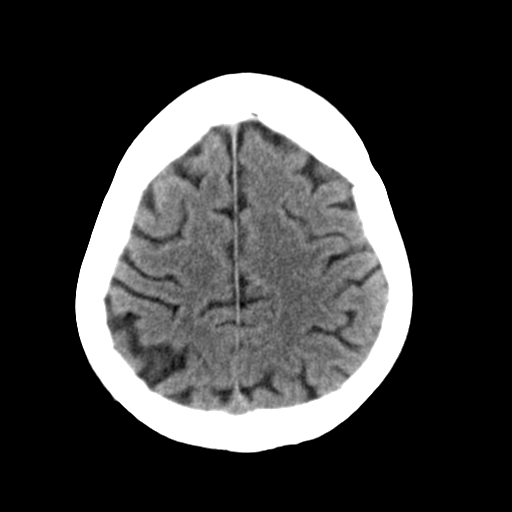
[im 23/31  brain]
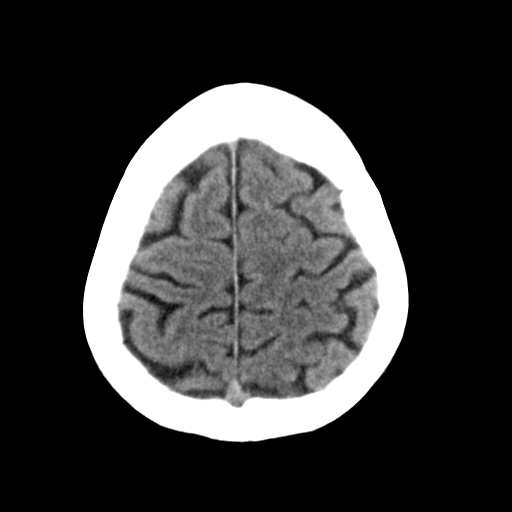
[im 23/31  bone]
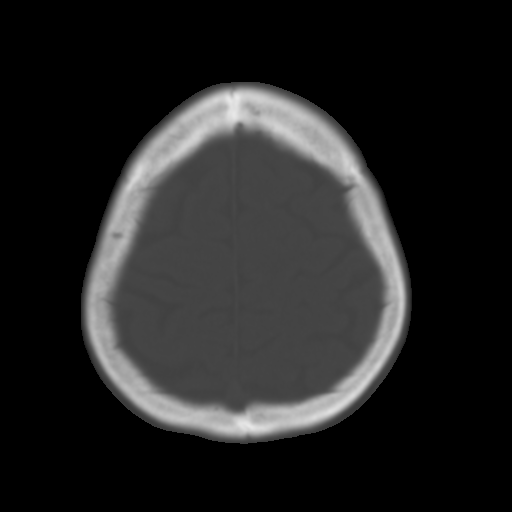
[im 25/31  brain]
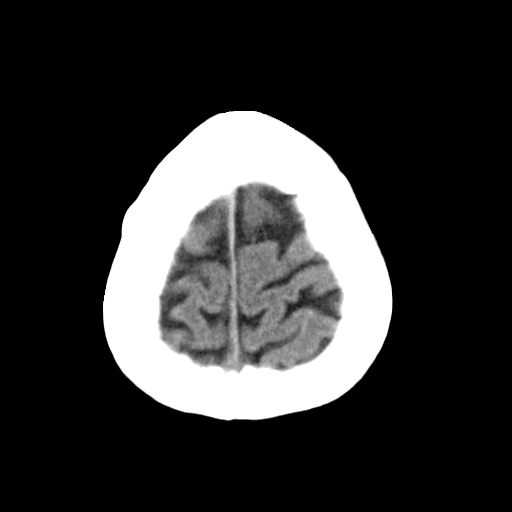
[im 27/31  brain]
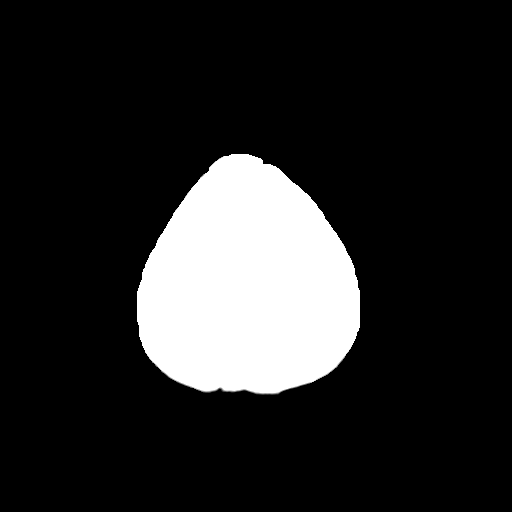
[im 29/31  brain]
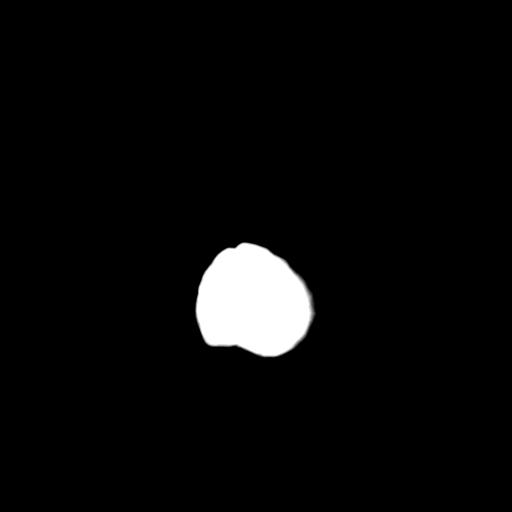

[16 of 30 positions shown; findings below may reference images not displayed]

FINDINGS: Mild atrophy.

Normal ventricular morphology.

No midline shift or mass effect.

Otherwise normal appearance of brain parenchyma.

No intracranial hemorrhage, mass lesion, or evidence acute
infarction.

No extra-axial fluid collections.

Visualized paranasal sinuses and mastoid air cells clear.

Mild hyperostosis frontalis interna.

No acute osseous findings.
IMPRESSION: No acute intracranial abnormalities.

Findings called to Dr. Bureititi on 12/27/2015 at 9812 hours.

## 2017-11-19 IMAGING — CT CT ABD-PELV W/ CM
1 of 3 series · 13 of 32 positions shown, 18 images · IV contrast (iopamidol)
Comparison: Multiple exams, including 12/16/2015 CT scan

CLINICAL DATA: Vomiting and dizziness. Abdominal pain. Weakness.
Lightheadedness. Cirrhosis.

EXAM:
CT ABDOMEN AND PELVIS WITH CONTRAST
TECHNIQUE: Multidetector CT imaging of the abdomen and pelvis was performed
using the standard protocol following bolus administration of
intravenous contrast.
CONTRAST:  100mL [id number] IOPAMIDOL ([id number]-JAA) INJECTION 61%

[Series 2: routine abd pel with · axial · 0.71mm/px · z∈[-460,+10]mm · 13 of 106 slices shown, 18 images]
[im 6/106  soft-tissue]
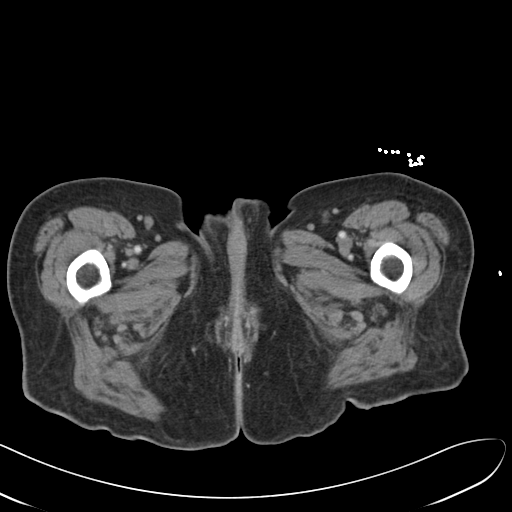
[im 6/106  bone]
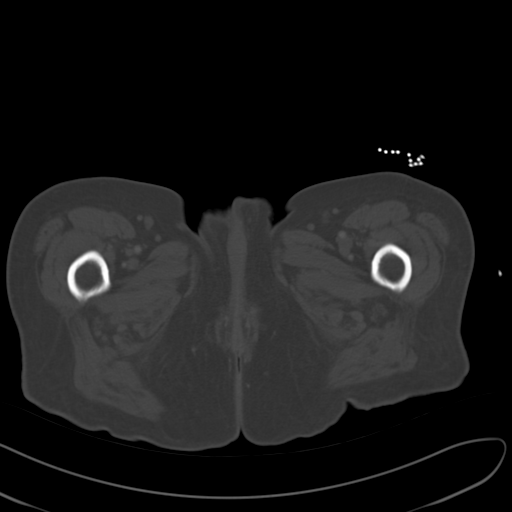
[im 17/106  soft-tissue]
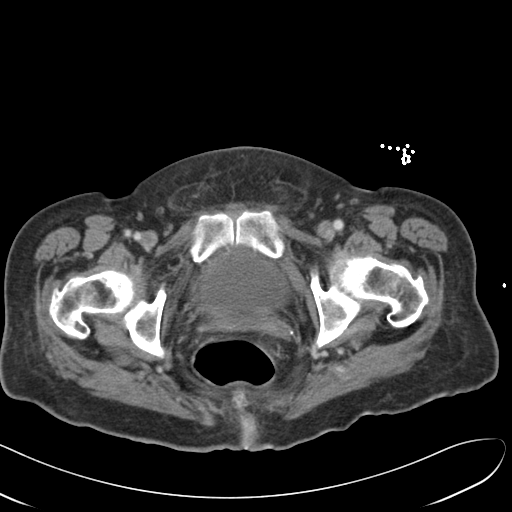
[im 23/106  soft-tissue]
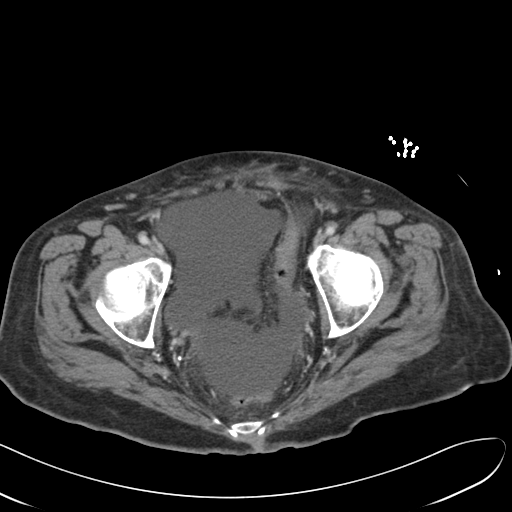
[im 34/106  soft-tissue]
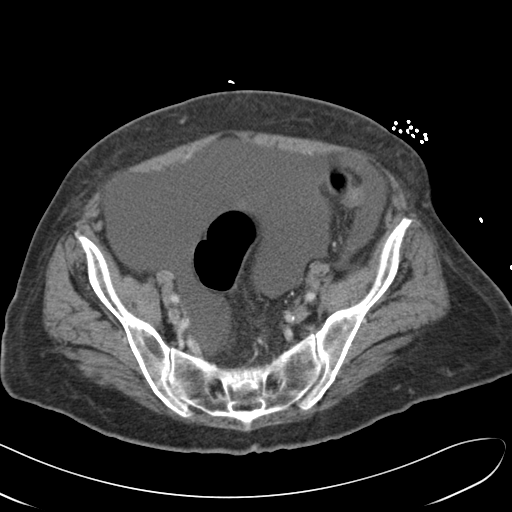
[im 39/106  soft-tissue]
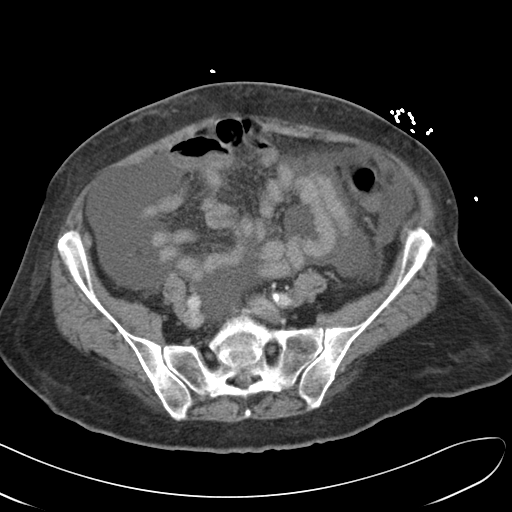
[im 50/106  soft-tissue]
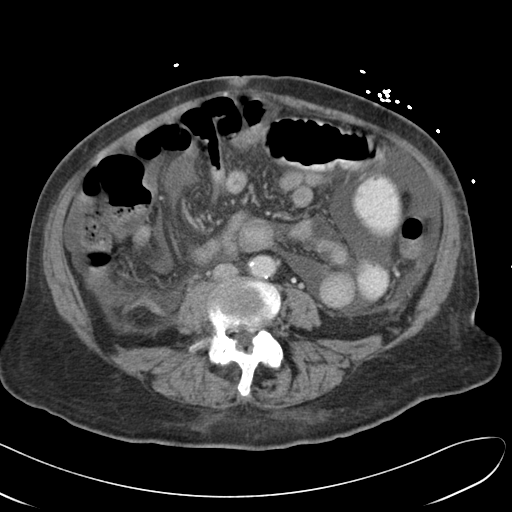
[im 56/106  soft-tissue]
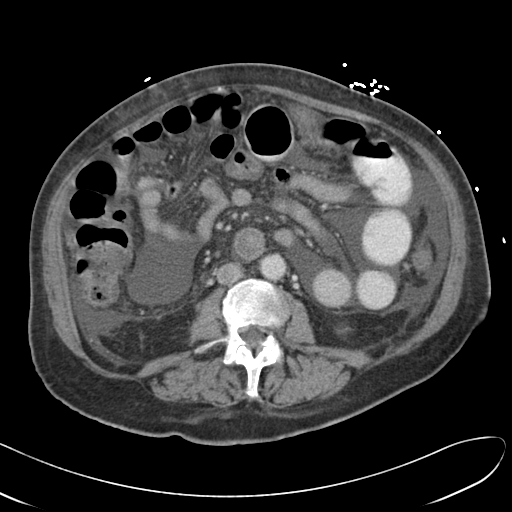
[im 67/106  soft-tissue]
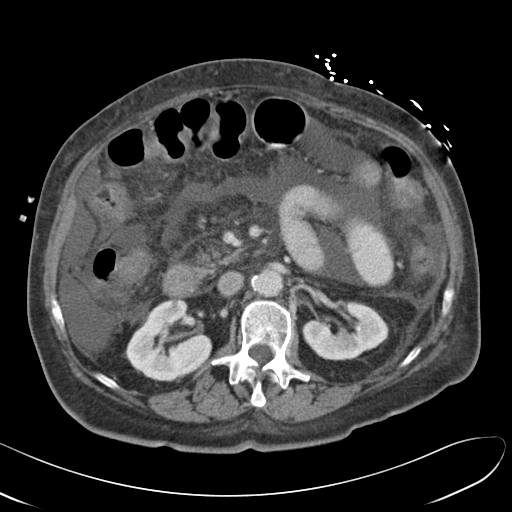
[im 72/106  soft-tissue]
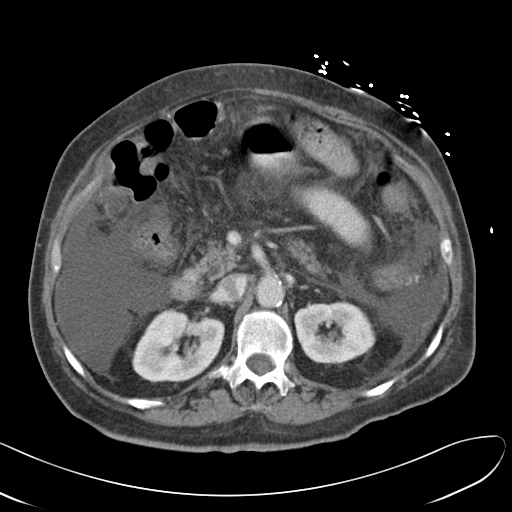
[im 72/106  bone]
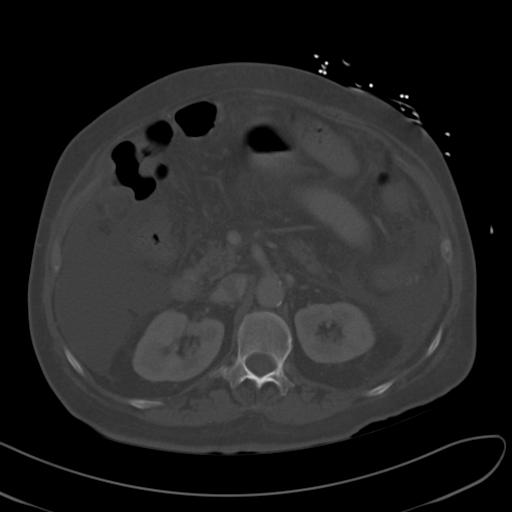
[im 83/106  soft-tissue]
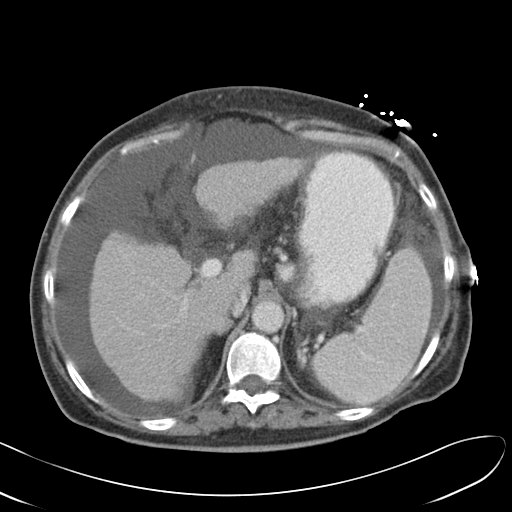
[im 83/106  lung]
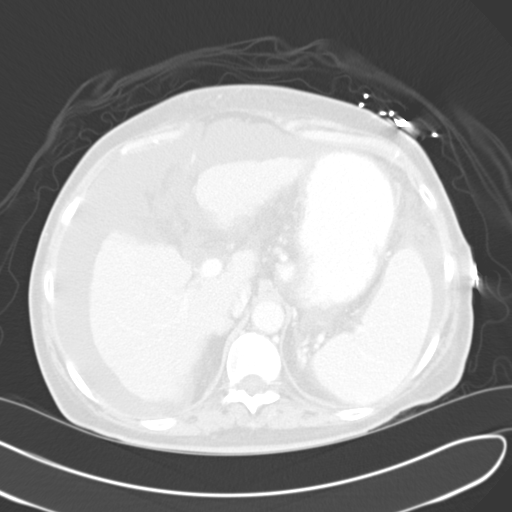
[im 89/106  soft-tissue]
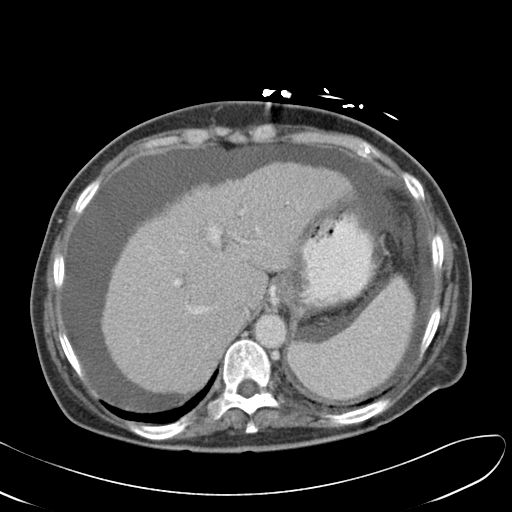
[im 89/106  lung]
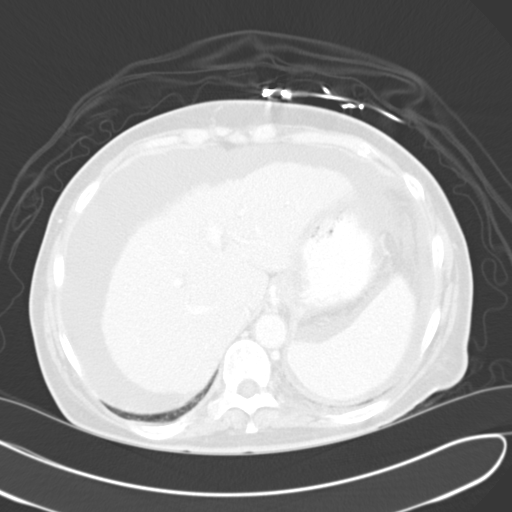
[im 94/106  lung]
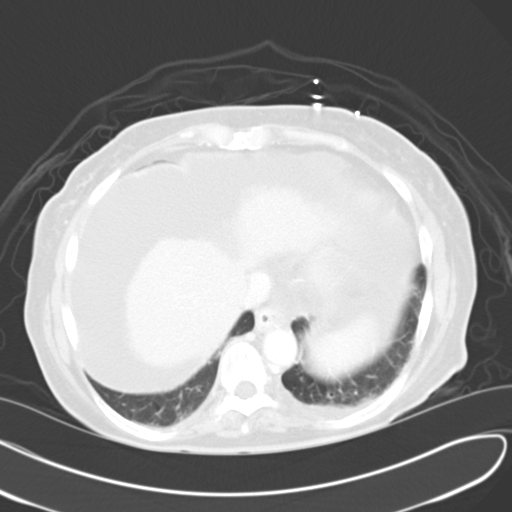
[im 100/106  soft-tissue]
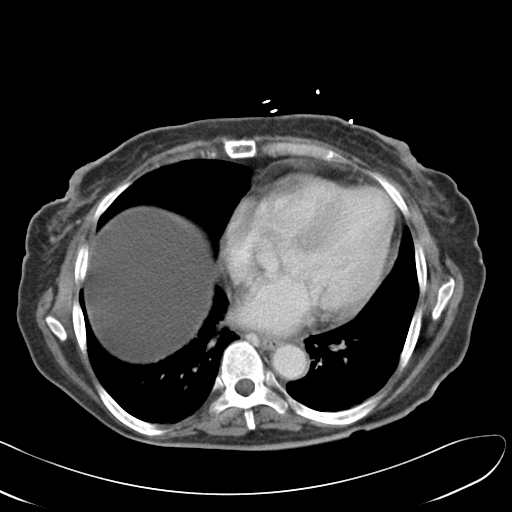
[im 100/106  lung]
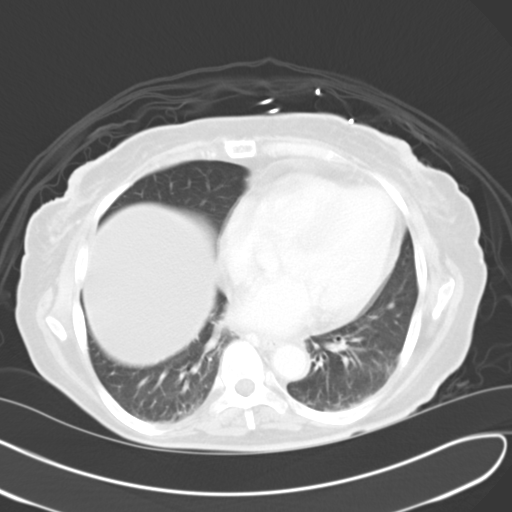

[13 of 32 positions shown; findings below may reference images not displayed]

FINDINGS: Lower chest: Mild interstitial accentuation in both lower lobes,
similar to 12/16/2015. Cardiomegaly with left ventricular
hypertrophy. Trace left pleural effusion. Trace pericardial
effusion. Small uphill varices adjacent to the distal esophagus.

Hepatobiliary: Advanced cirrhosis. No focal liver mass seen.
Cholecystectomy.

Pancreas: Unremarkable

Spleen: Unremarkable

Adrenals/Urinary Tract: Adrenal glands normal. Right kidney lower
pole 9 mm calculus, not appreciably changed. Small hypodense lesions
of the right kidney are statistically likely to be cysts but
technically too small to characterize. No hydronephrosis, ureteral
calculus, or bladder calculus seen.

Stomach/Bowel: Wall thickening of the stomach along the gastric
cardia, somewhat resembling a fundoplication, correlate with
operative history. Several mildly dilated loops of jejunum in the
left abdomen, without a transition point to suggest obstruction.
Equivocal wall thickening in the ascending colon.

Vascular/Lymphatic: Aortoiliac atherosclerotic vascular disease.
Gastric varices. Splenorenal shunting. Portal vein and splenic vein
patent. No pathologic adenopathy identified.

Reproductive: Uterus absent.  Ovaries not well seen.

Other: Considerable ascites, increased from the exam 11 days ago.
Increased mesenteric and subcutaneous edema.

Musculoskeletal: Healing fractures the right anterior fourth, fifth,
sixth, seventh, and eighth ribs with associated rib sclerosis, not
appreciably changed from 12/16/2015.
IMPRESSION: 1. Compared to the exam from 11 days ago, there is worsening third
spacing of fluid with diffuse mesenteric and subcutaneous edema
along with increase in ascites.
2. Cirrhosis.
3. Trace left pleural effusion and trace pericardial effusion.
4. Healing anterior fourth through eighth rib fractures on the
right.
5. Cardiomegaly with left ventricular hypertrophy.
6. The uphill esophageal varices and gastric varices. Splenorenal
shunting.
7. Nonobstructive right kidney lower pole 9 mm calculus.
8. Wall thickening along the gastric cardia, query prior
fundoplication.
9. Equivocal wall thickening in the ascending colon, possibly from
third spacing of fluid or mild proximal colitis.
10.  Aortoiliac atherosclerotic vascular disease.

## 2017-11-20 IMAGING — DX DG ABD PORTABLE 1V
1 series · 1 of 1 positions shown · non-contrast
Comparison: CT 12/27/2015

CLINICAL DATA: Distended abdomen

EXAM:
PORTABLE ABDOMEN - 1 VIEW

[abdomen kub]
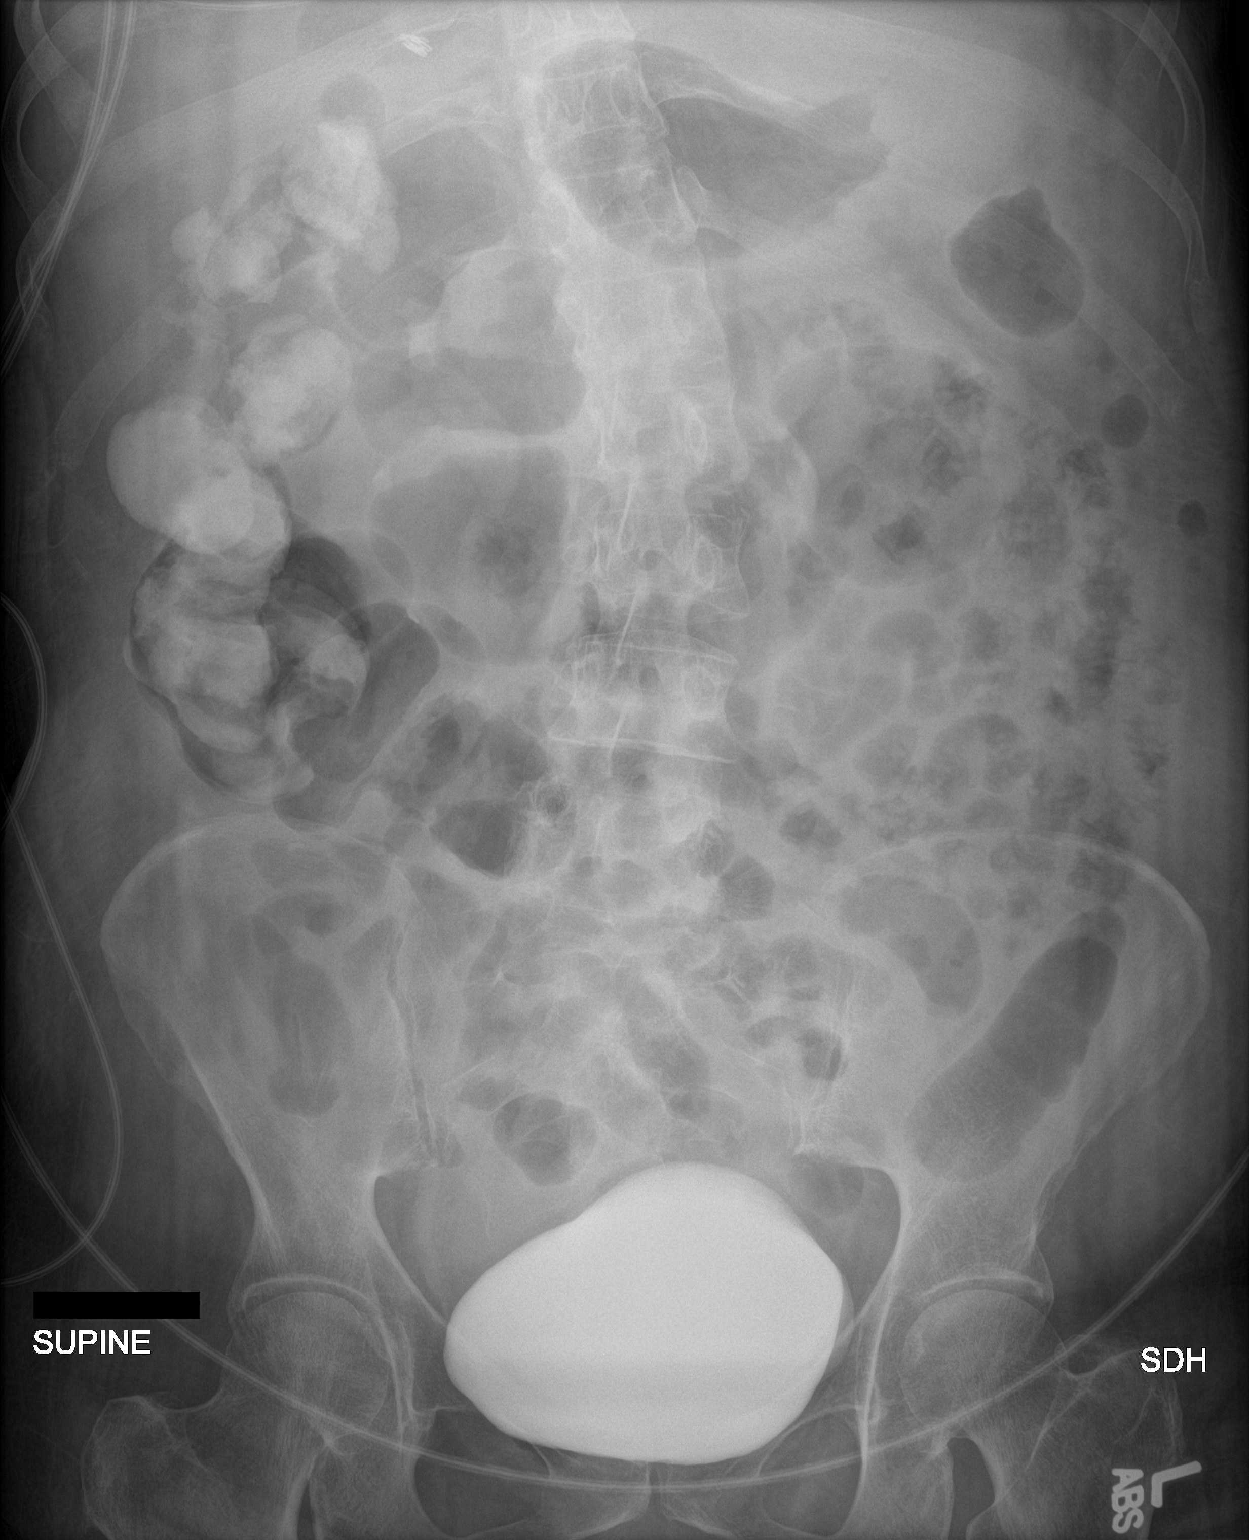

[1 of 1 positions shown; findings below may reference images not displayed]

FINDINGS: Contrast in the right colon from prior CT. Gas in large and small
bowel compatible with ileus. Negative for bowel obstruction

Contrast in urinary bladder from recent CT scan. Lumbar
levoscoliosis.
IMPRESSION: Mild ileus. Nonobstructive bowel gas pattern. Contrast in the right
colon from CT yesterday.

## 2017-11-21 IMAGING — DX DG ABDOMEN 1V
2 series · 2 of 2 positions shown · non-contrast
Comparison: None.

CLINICAL DATA: Ascending colitis with septic shock.  Ileus.

EXAM:
ABDOMEN - 1 VIEW

[abdomen kub (1 of 2)]
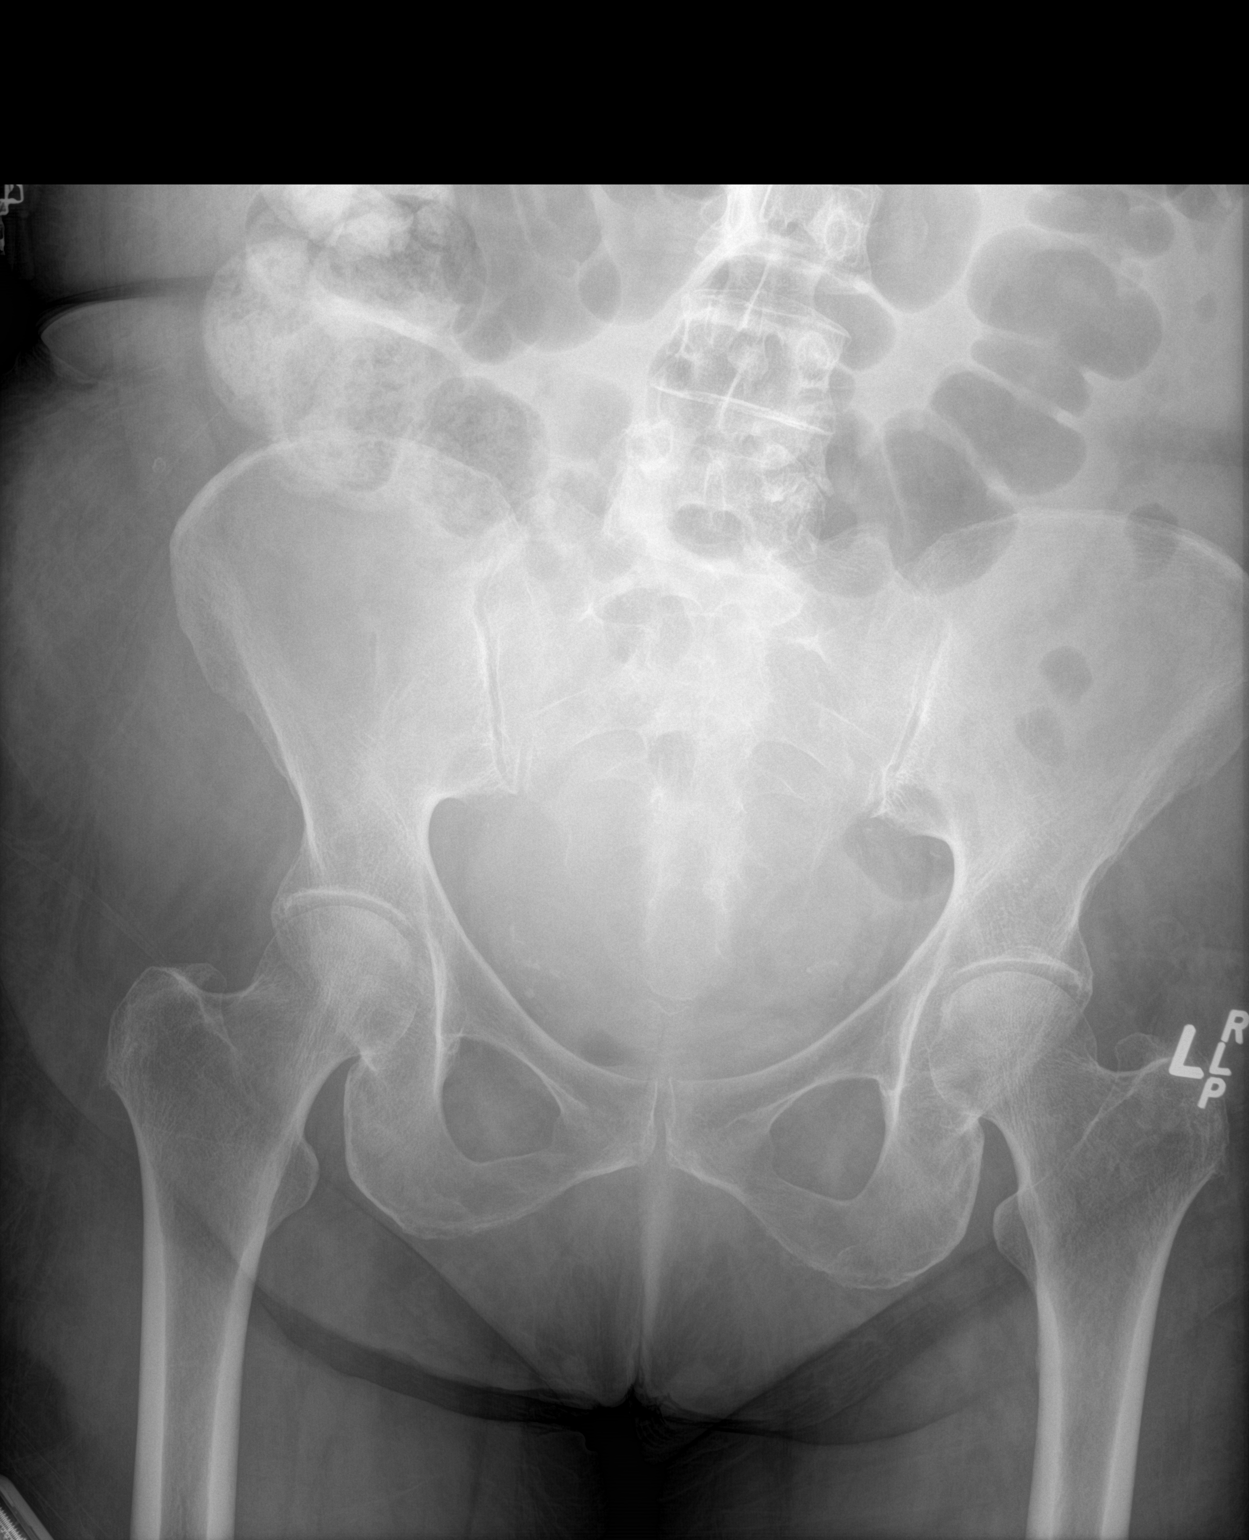

[abdomen kub (2 of 2)]
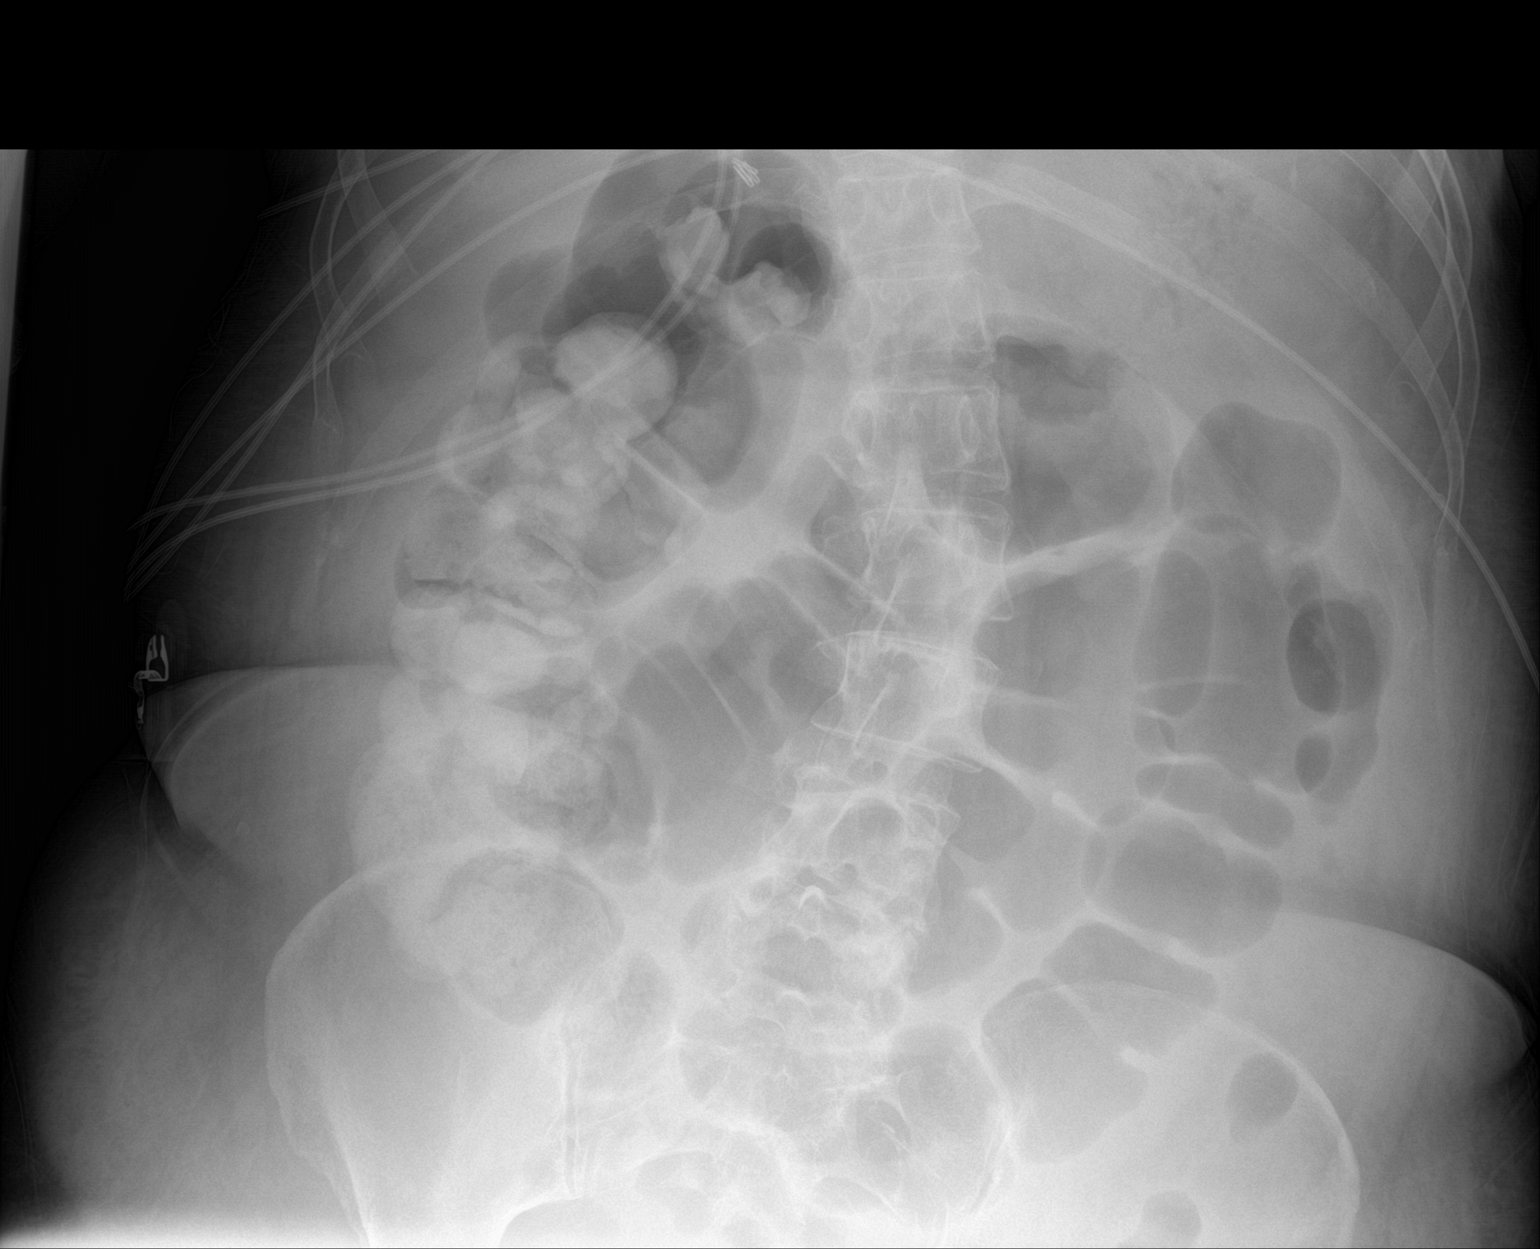

[2 of 2 positions shown; findings below may reference images not displayed]

FINDINGS: Gaseous distension of the colon identified. There is been interval
increase in caliber of the large bowel loops with decrease and small
bowel air. Enteric contrast material remains within the right colon.
IMPRESSION: 1. Imaging findings compatible with colonic ileus.

## 2017-11-21 IMAGING — CR DG CHEST 2V
2 series · 2 of 2 positions shown · non-contrast
Comparison: 12/27/2015 chest radiograph.

CLINICAL DATA: Shortness of breath.  Uterine cancer.

EXAM:
CHEST  2 VIEW

[chest lat]
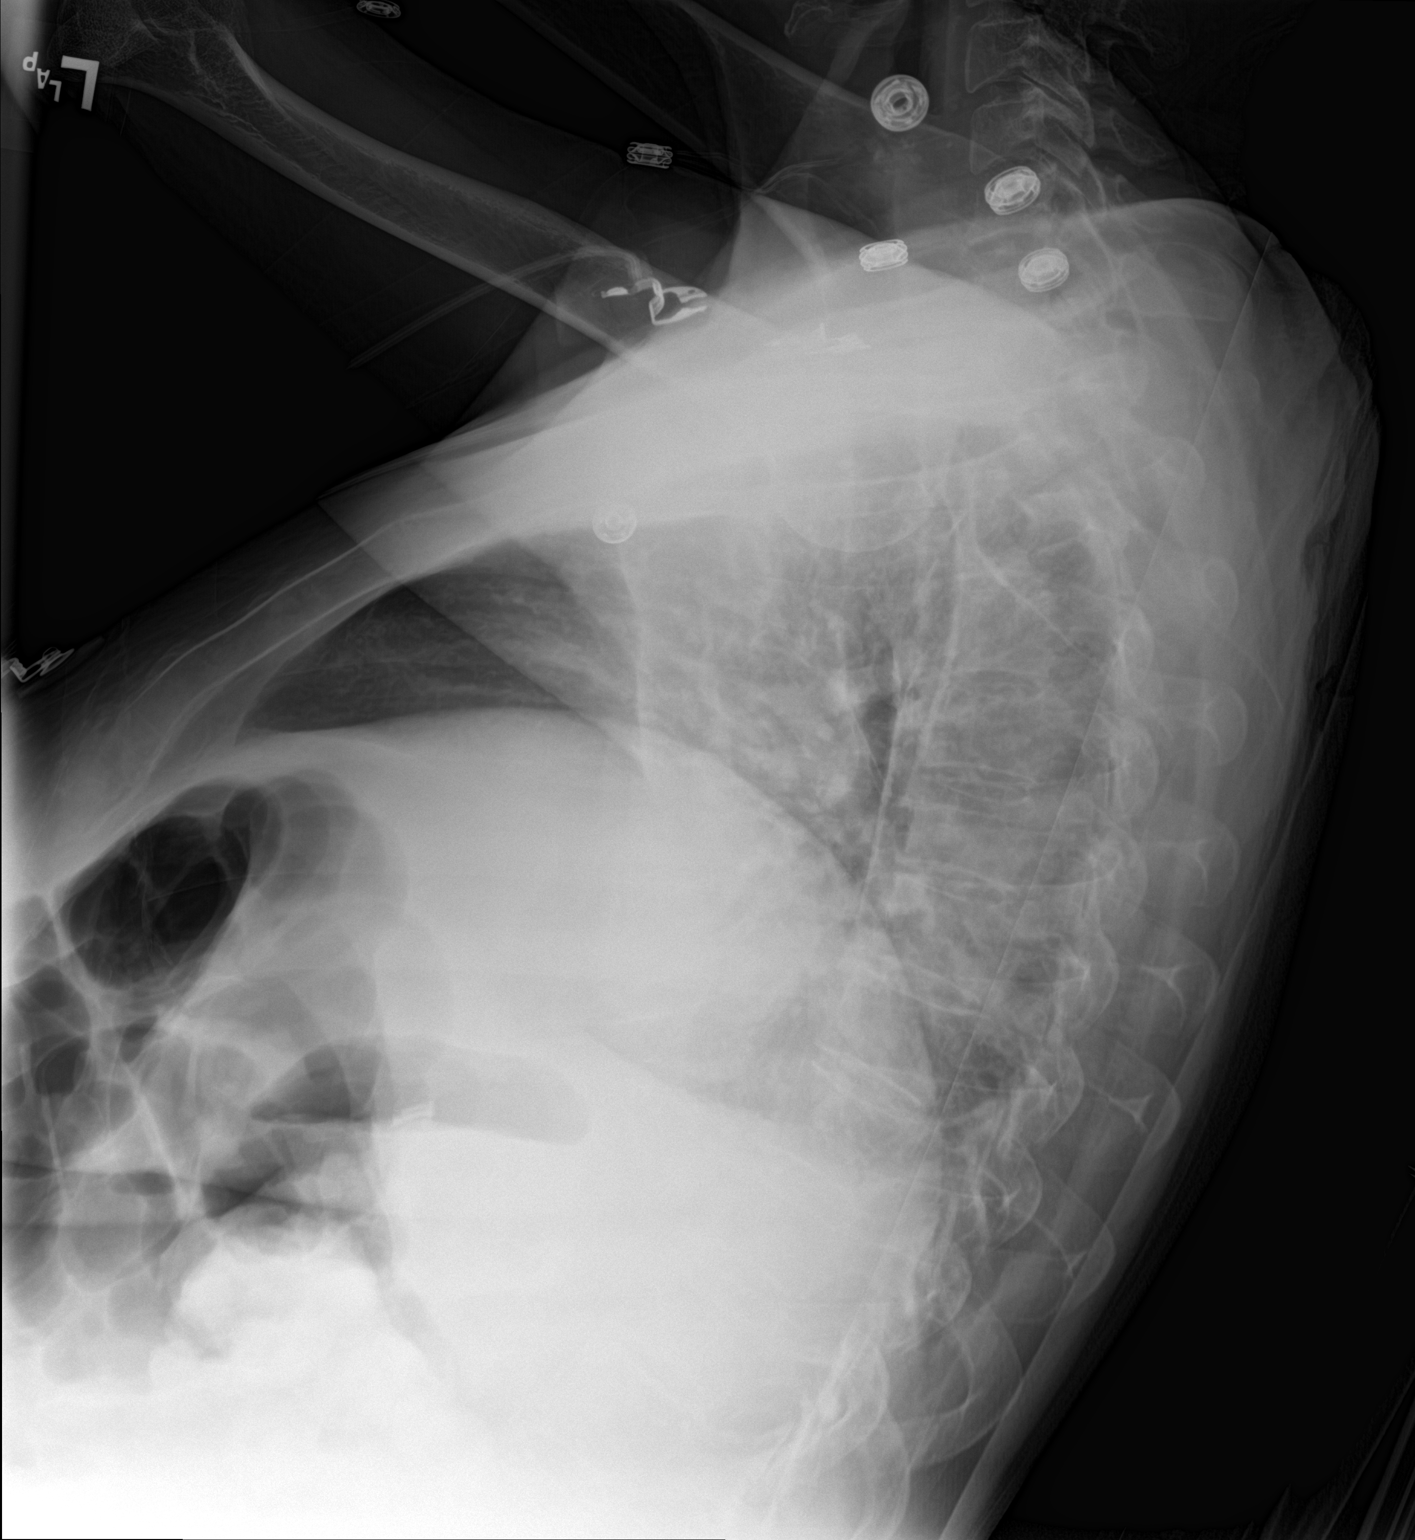

[chest ap]
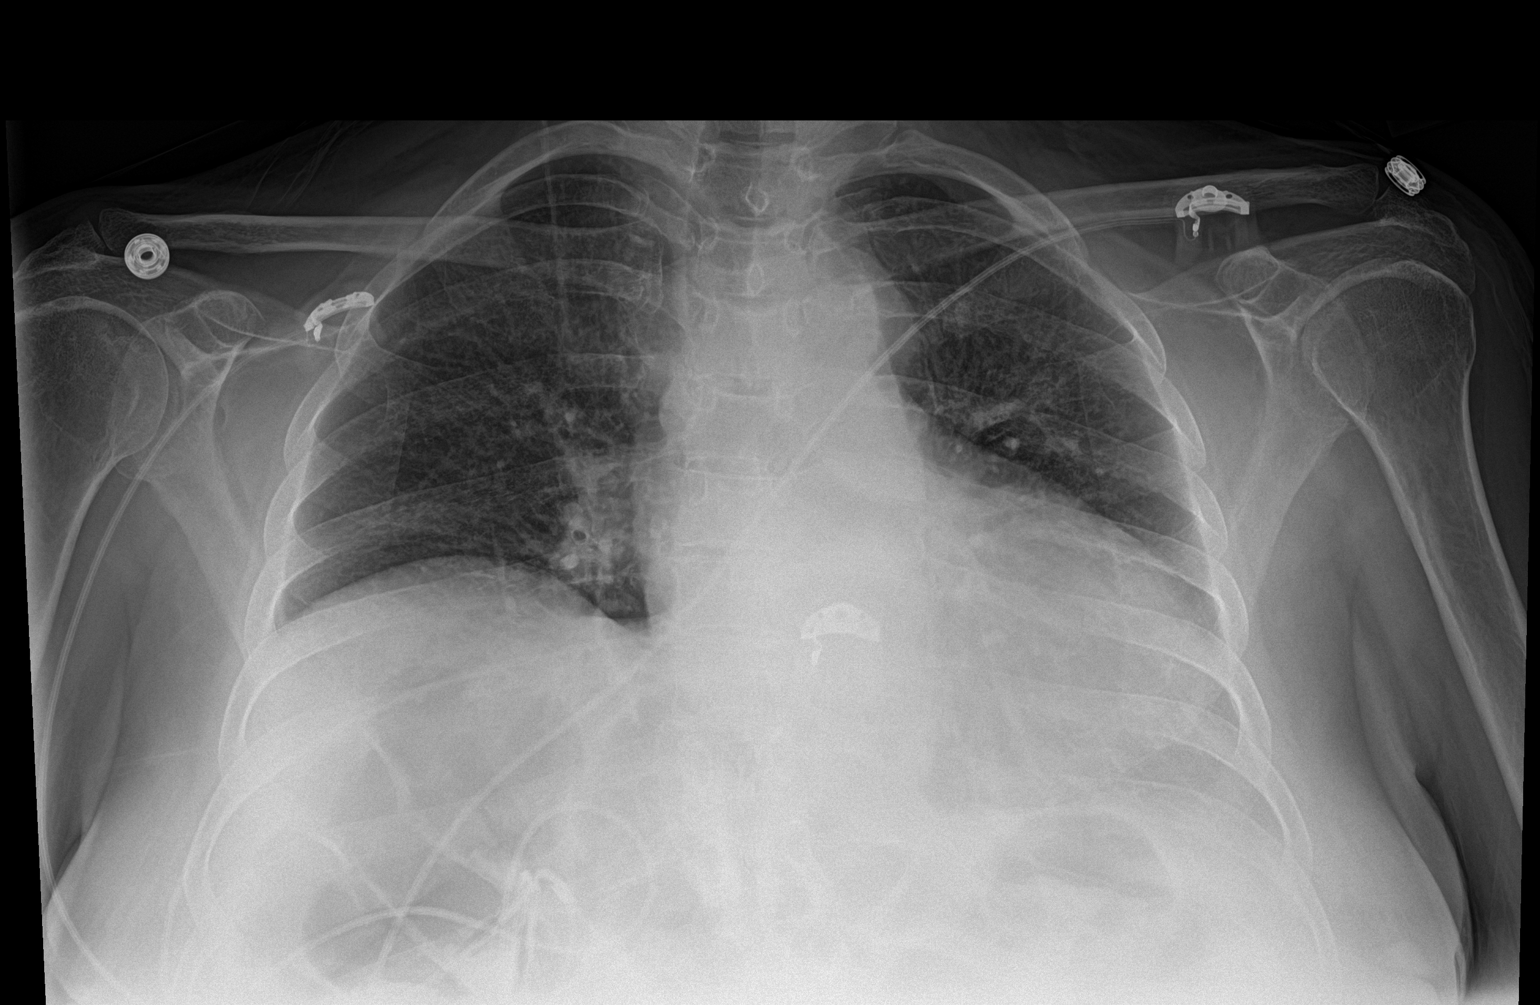

[2 of 2 positions shown; findings below may reference images not displayed]

FINDINGS: Low lung volumes. Stable cardiomediastinal silhouette with
top-normal heart size. No pneumothorax. No pleural effusion. Stable
mild-to-moderate elevation of the right hemidiaphragm. No pulmonary
edema. Mild bibasilar atelectasis. No acute consolidative airspace
disease.
IMPRESSION: Low lung volumes. Stable elevation of the right hemidiaphragm with
mild bibasilar atelectasis.

## 2017-11-21 IMAGING — MR MR MRA NECK WO/W CM
2 series · 30 of 48 positions shown · IV contrast (13 ML MULTIHANCE)
Comparison: Brain MRI and intracranial MRA from today reported
separately.

CLINICAL DATA: 59-year-old female with vomiting and dizziness for
several days. Initial encounter.

EXAM:
MRA NECK WITHOUT AND WITH CONTRAST
TECHNIQUE: Multiplanar and multiecho pulse sequences of the neck were obtained
without and with intravenous contrast. Angiographic images of the
neck were obtained using MRA technique without and with intravenous
contrast.
CONTRAST:  13mL MULTIHANCE GADOBENATE DIMEGLUMINE 529 MG/ML IV SOLN

[Series 3: [id number] (res 256) · axial · 1.0mm · 0.45mm/px · z∈[-29,+101]mm · 21 of 137 slices shown]
[im 1/137]
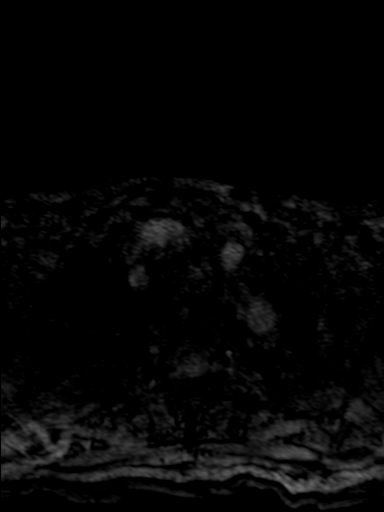
[im 6/137]
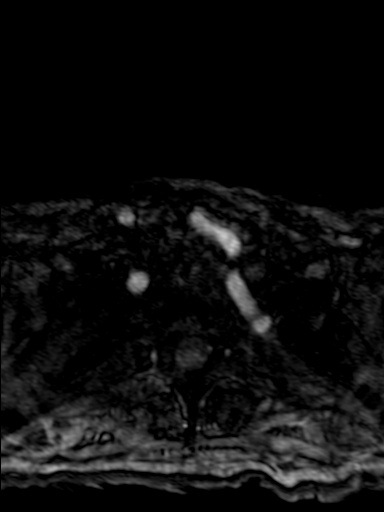
[im 11/137]
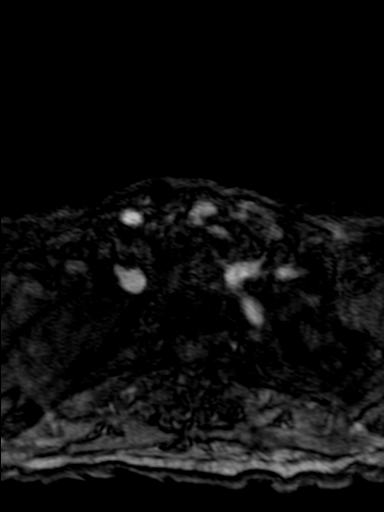
[im 16/137]
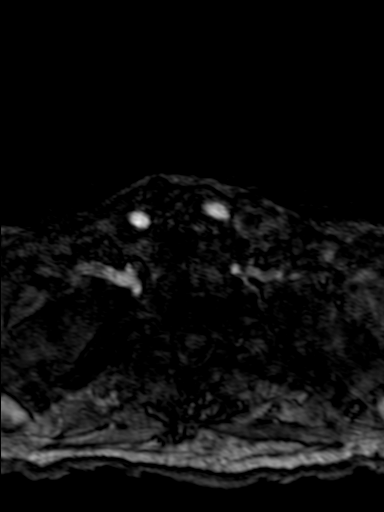
[im 21/137]
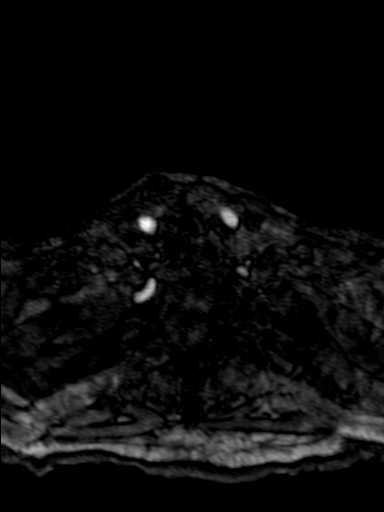
[im 27/137]
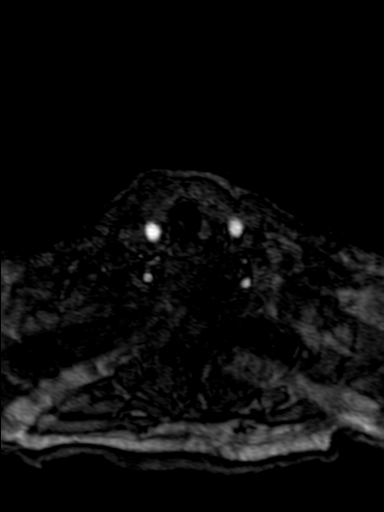
[im 32/137]
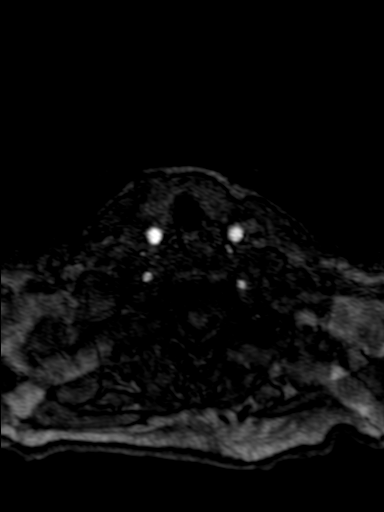
[im 37/137]
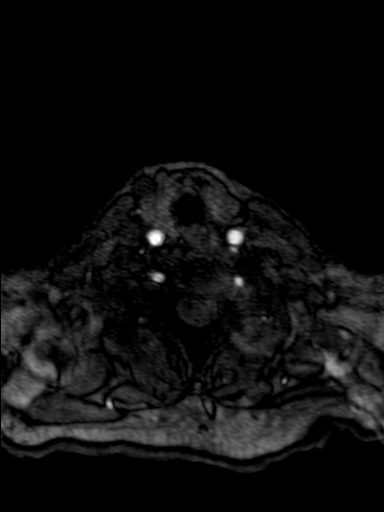
[im 42/137]
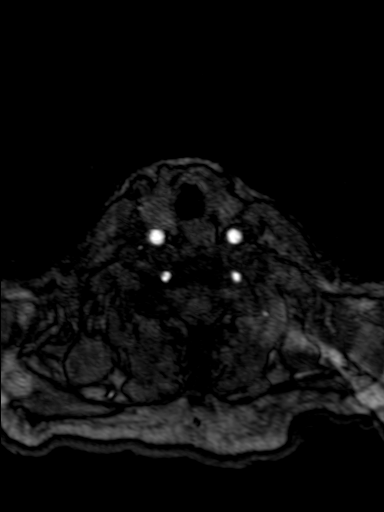
[im 48/137]
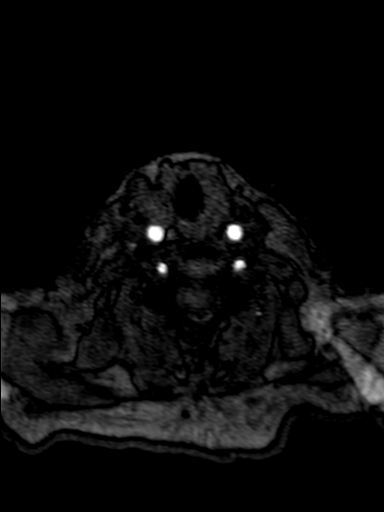
[im 53/137]
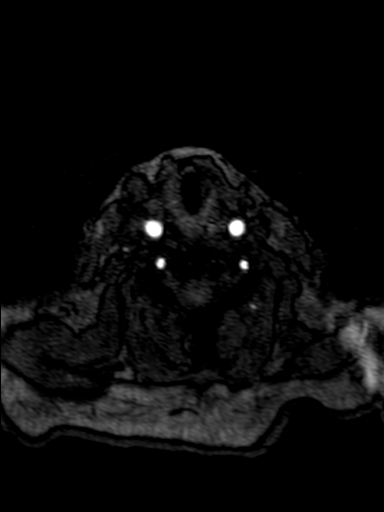
[im 58/137]
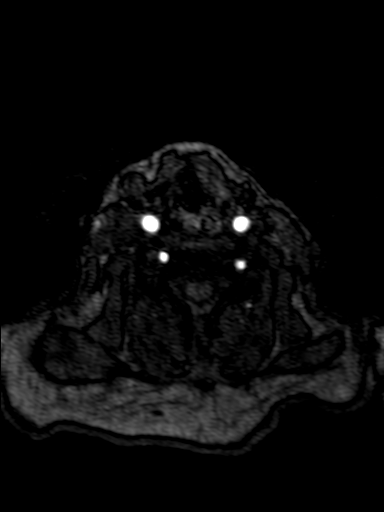
[im 63/137]
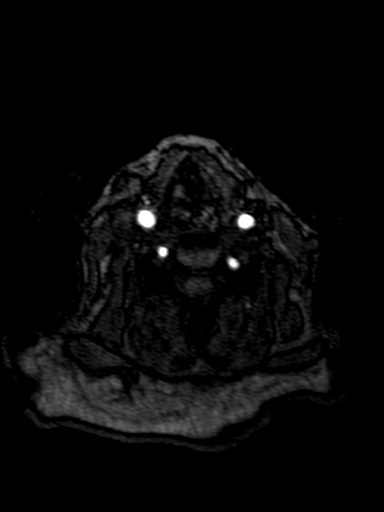
[im 69/137]
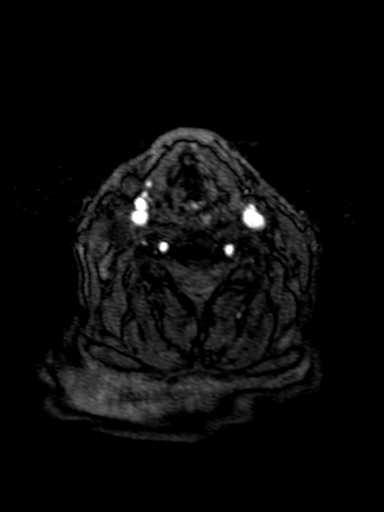
[im 74/137]
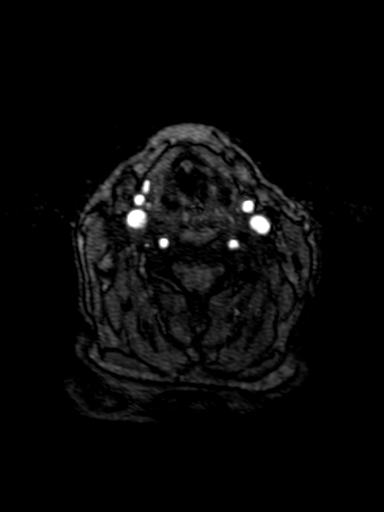
[im 79/137]
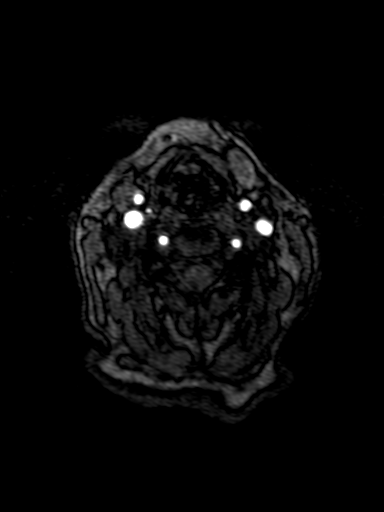
[im 84/137]
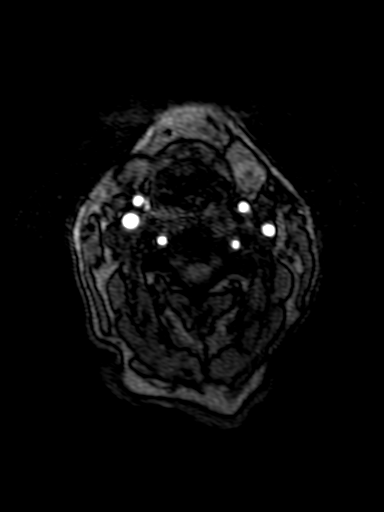
[im 95/137]
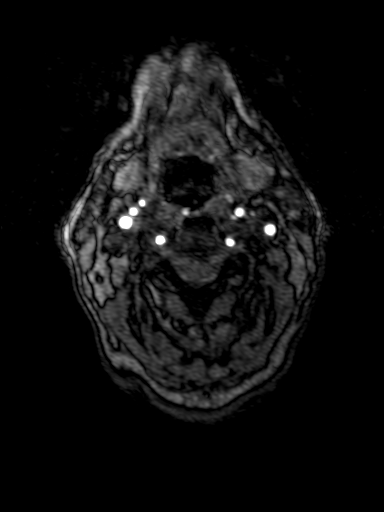
[im 110/137]
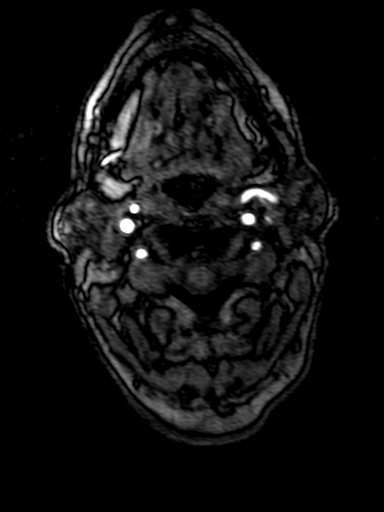
[im 116/137]
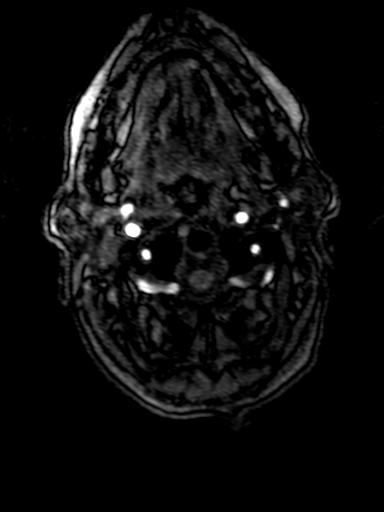
[im 131/137]
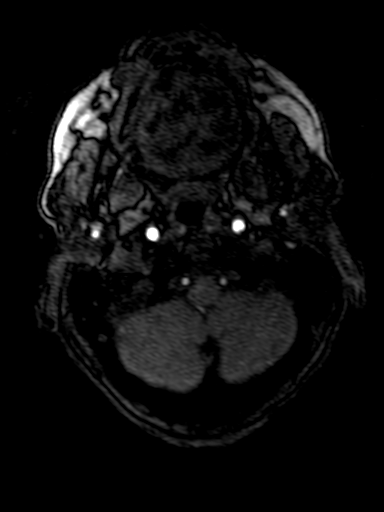

[Series 6: [id number] · coronal · 0.7mm · 0.78mm/px · 9 of 104 slices shown]
[im 6/104]
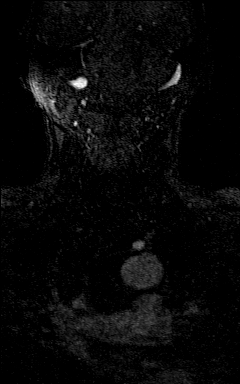
[im 16/104]
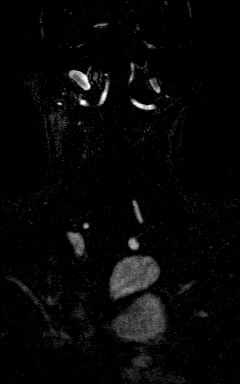
[im 31/104]
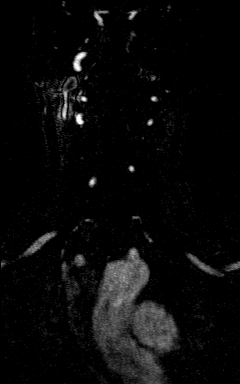
[im 47/104]
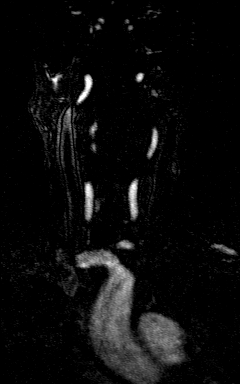
[im 52/104]
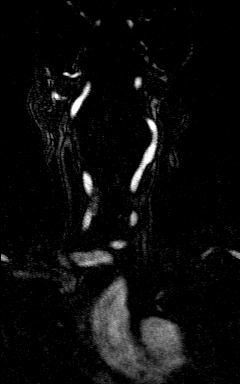
[im 57/104]
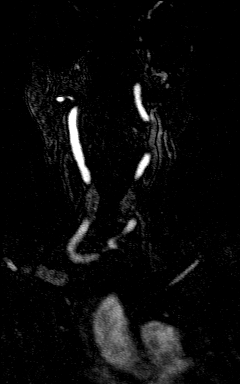
[im 73/104]
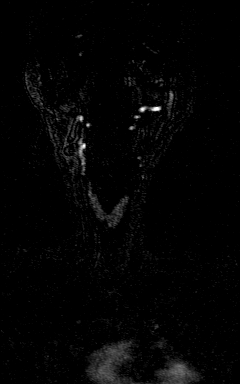
[im 88/104]
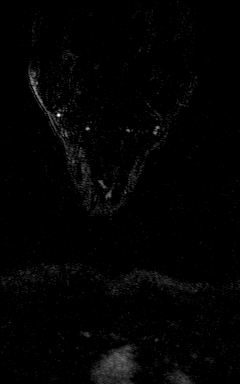
[im 98/104]
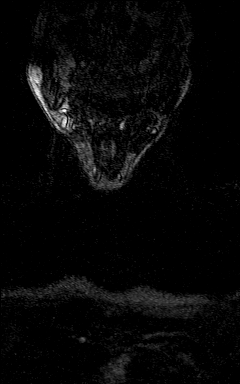

[30 of 48 positions shown; findings below may reference images not displayed]

FINDINGS: Precontrast time-of-flight images reveal antegrade flow in both
carotid and vertebral arteries throughout the neck. Carotid
bifurcations appear normal. Vertebral arteries are codominant.

Post-contrast neck MRA images reveal a 3 vessel arch configuration.
Tortuous proximal great vessels but no great vessel origin stenosis.

Tortuous proximal right CCA. Widely patent right carotid
bifurcation. Negative cervical right ICA.

Tortuous proximal left CCA. Mild irregularity at the left carotid
bifurcation primarily affecting the ECA origin. Widely patent left
ICA origin and bulb. Negative cervical left ICA.

Normal vertebral artery origins. Mildly dominant right vertebral
artery throughout the neck. No vertebral artery stenosis to the
vertebrobasilar junction.
IMPRESSION: Negative neck MRA aside from tortuosity of the great vessels.

## 2017-11-22 IMAGING — DX DG ABDOMEN 1V
2 series · 2 of 2 positions shown · non-contrast
Comparison: 12/29/2015.

CLINICAL DATA: Ileus.

EXAM:
ABDOMEN - 1 VIEW

[abdomen kub (1 of 2)]
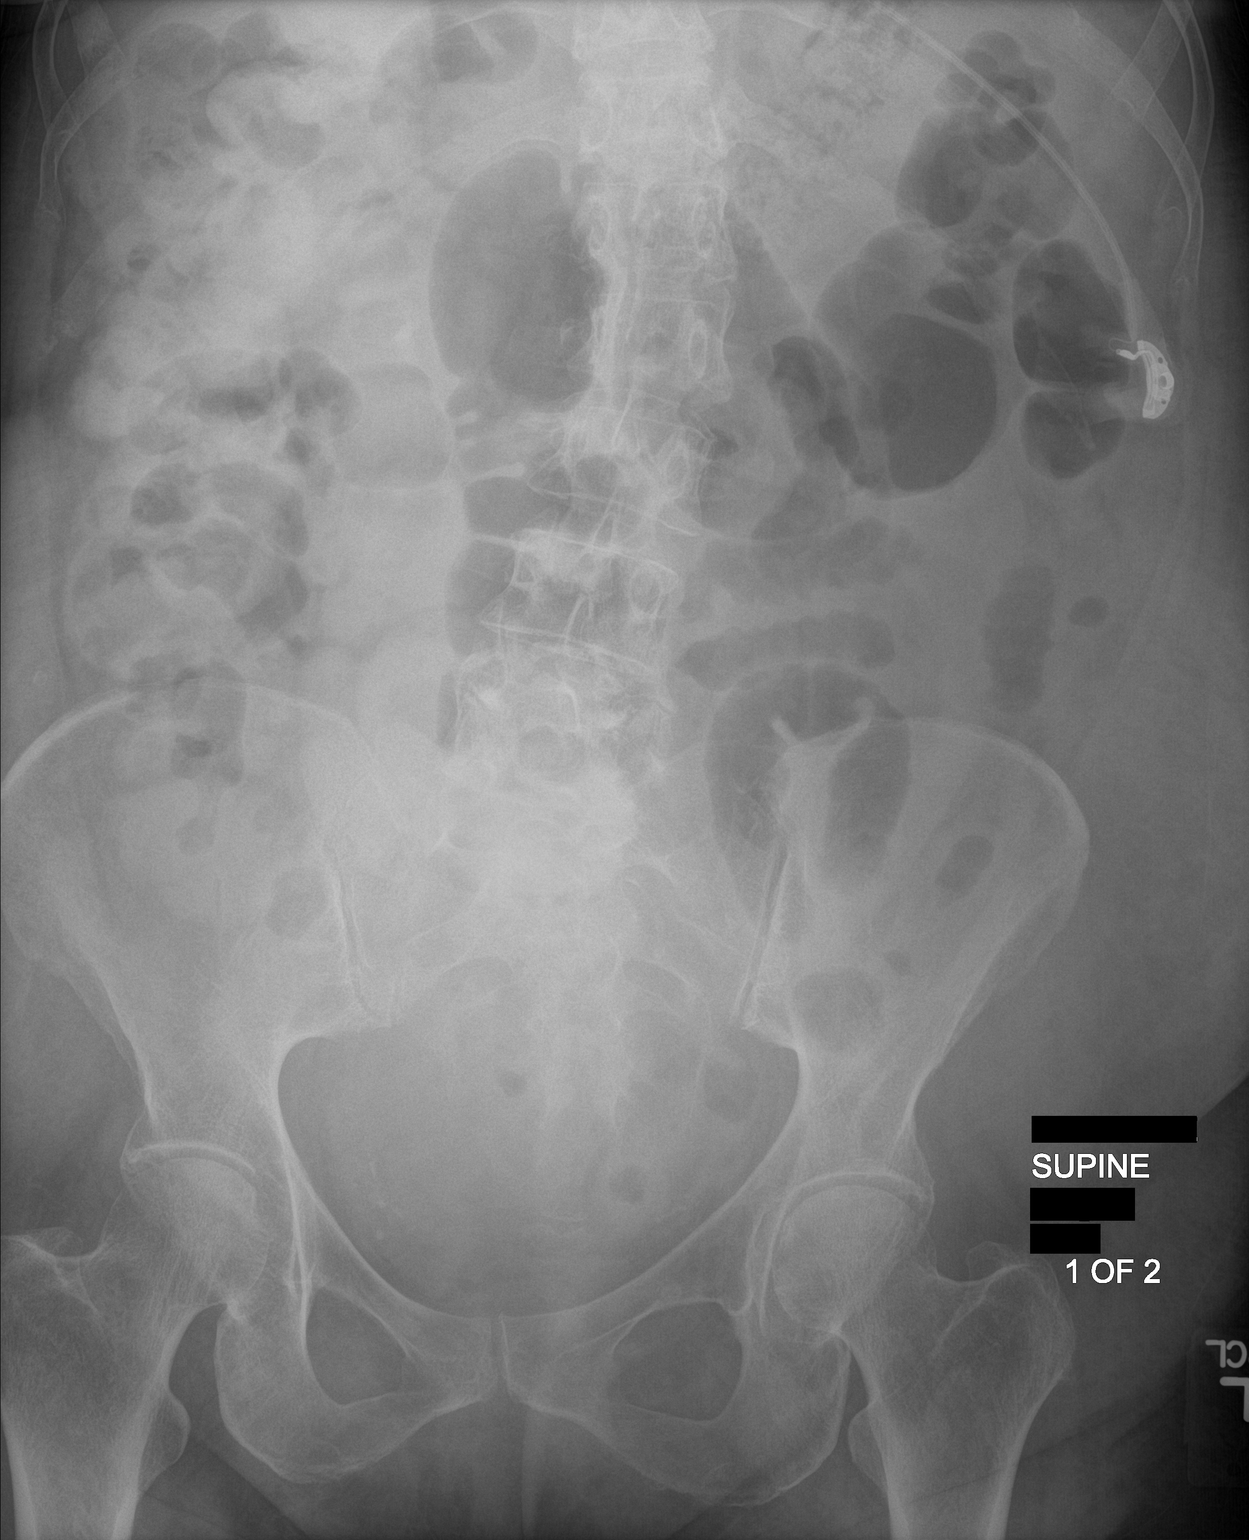

[abdomen kub (2 of 2)]
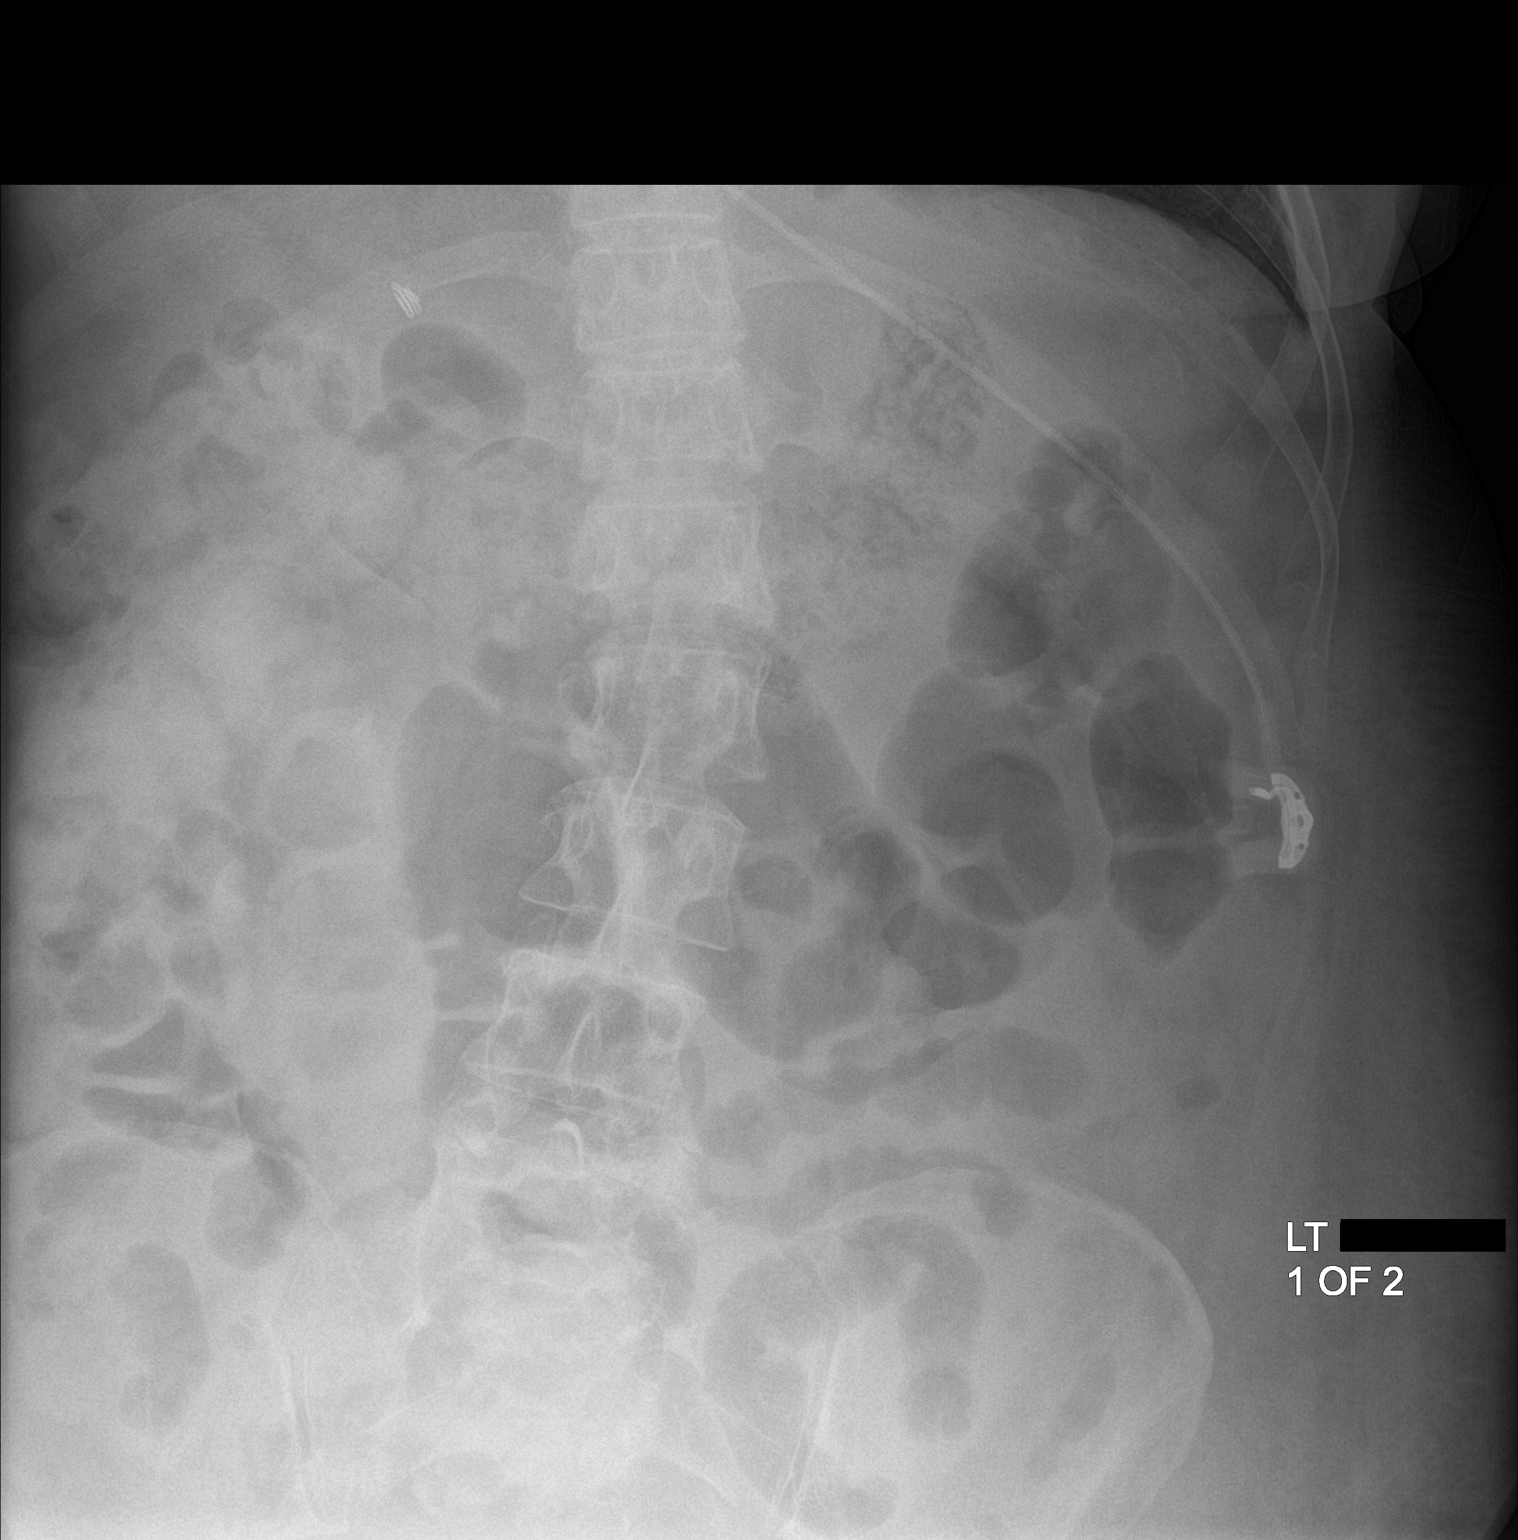

[2 of 2 positions shown; findings below may reference images not displayed]

FINDINGS: Residual oral contrast and stool are seen in the ascending colon.
Mild gaseous distention of remaining colon, minimally improved. Gas
is seen in nondistended small bowel.
IMPRESSION: Minimal improvement in colonic ileus.

## 2017-12-03 IMAGING — CT CT ABD-PELV W/ CM
2 of 5 series · 15 of 30 positions shown, 18 images · IV contrast (APPLIED)
Comparison: 12/17/2015 12/27/2015

CLINICAL DATA: Tachycardia and chest pain, abdominal pain

EXAM:
CT ANGIOGRAPHY CHEST
CT ABDOMEN AND PELVIS WITH CONTRAST
TECHNIQUE: Multidetector CT imaging of the chest was performed using the
standard protocol during bolus administration of intravenous
contrast. Multiplanar CT image reconstructions and MIPs were
obtained to evaluate the vascular anatomy. Multidetector CT imaging
of the abdomen and pelvis was performed using the standard protocol
during bolus administration of intravenous contrast.
CONTRAST:  100 mL Isovue 370.

[Series 5: routine abd pel with · axial · 0.78mm/px · z∈[-546,-276]mm · 4 of 108 slices shown]
[im 27/108  lung]
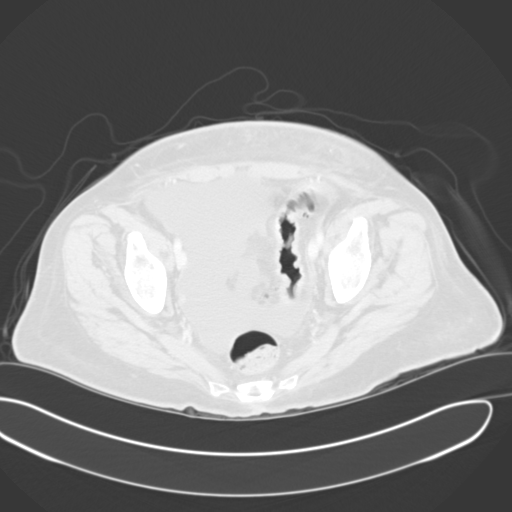
[im 54/108  lung]
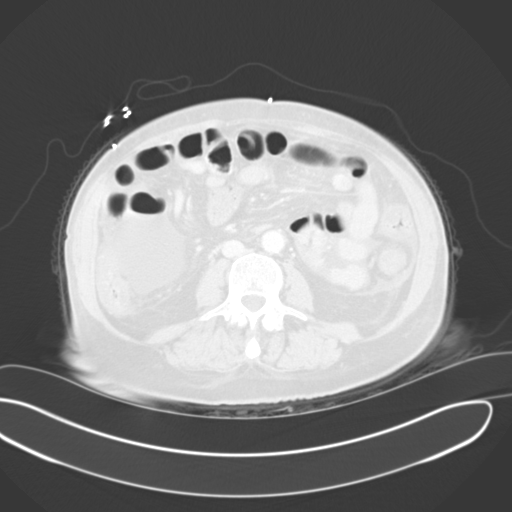
[im 64/108  lung]
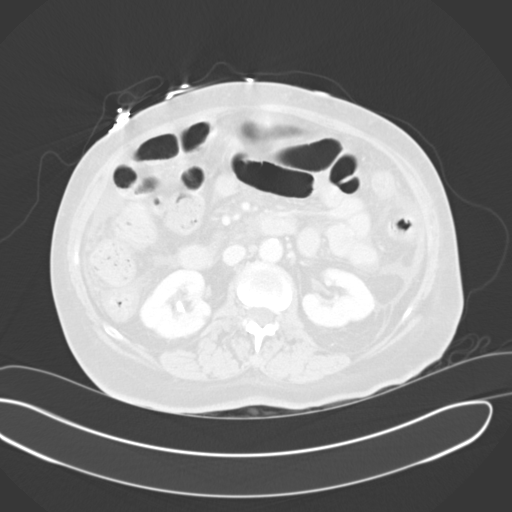
[im 81/108  lung]
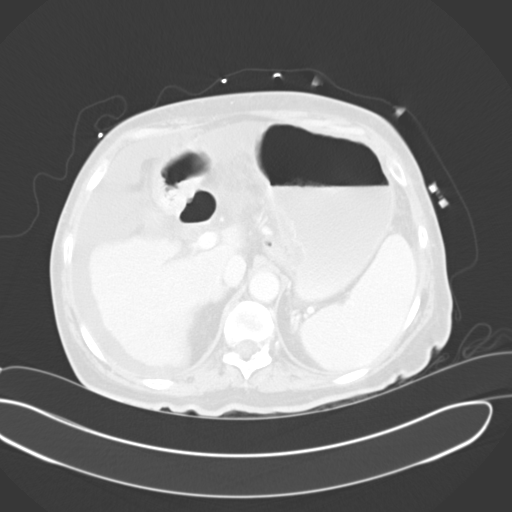

[Series 8: pe 1.0 thins · axial · 0.69mm/px · z∈[-274,-56]mm · 11 of 261 slices shown, 14 images]
[im 22/261  mediastinal]
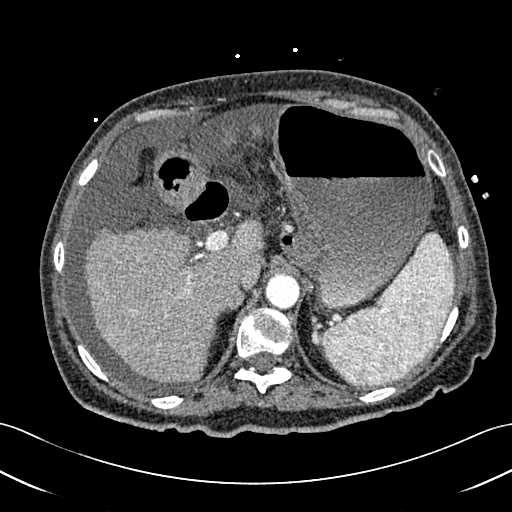
[im 22/261  lung]
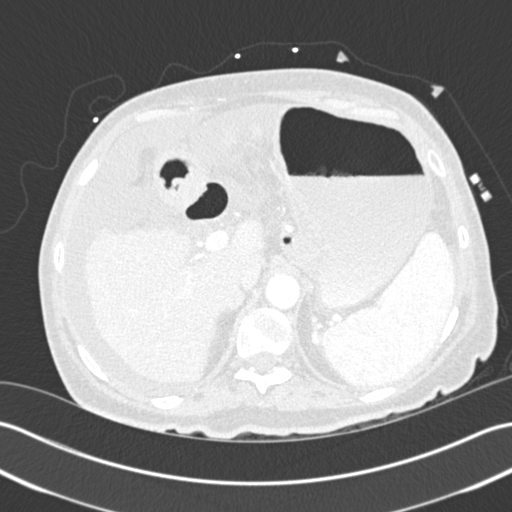
[im 44/261  lung]
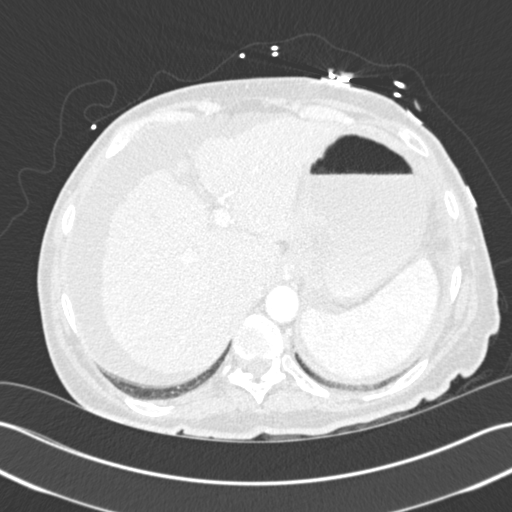
[im 66/261  lung]
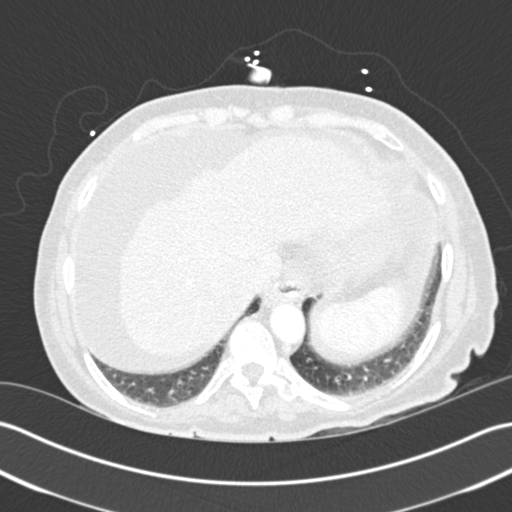
[im 87/261  lung]
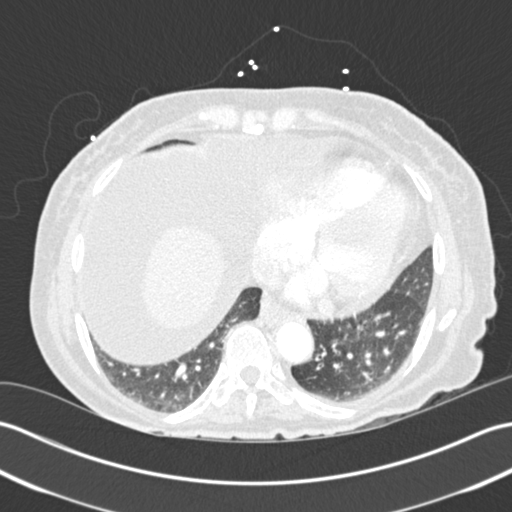
[im 109/261  mediastinal]
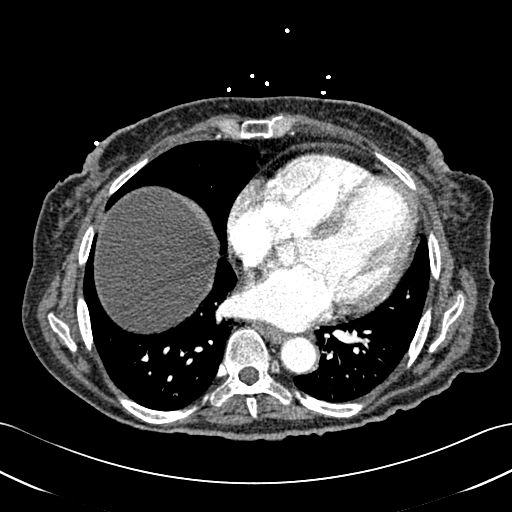
[im 109/261  lung]
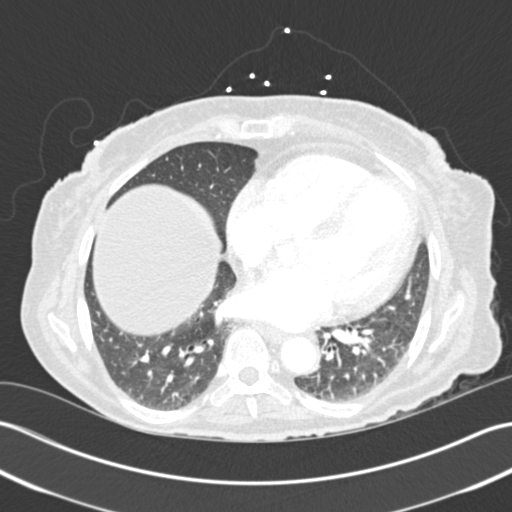
[im 131/261  lung]
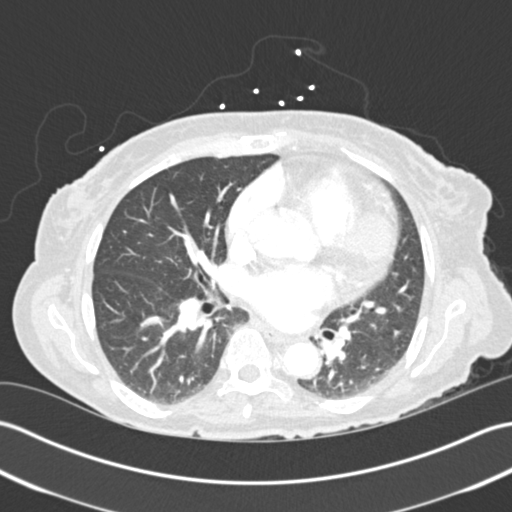
[im 152/261  lung]
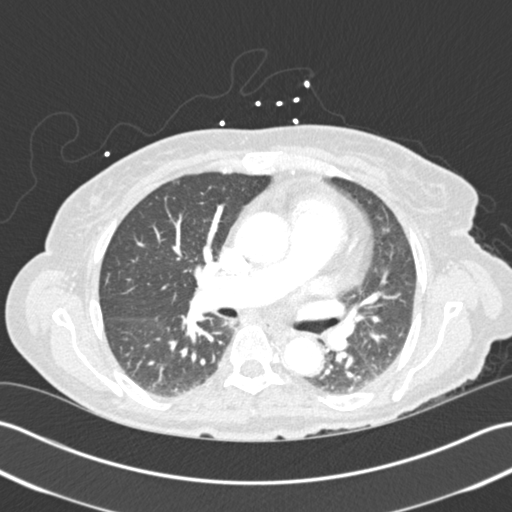
[im 174/261  lung]
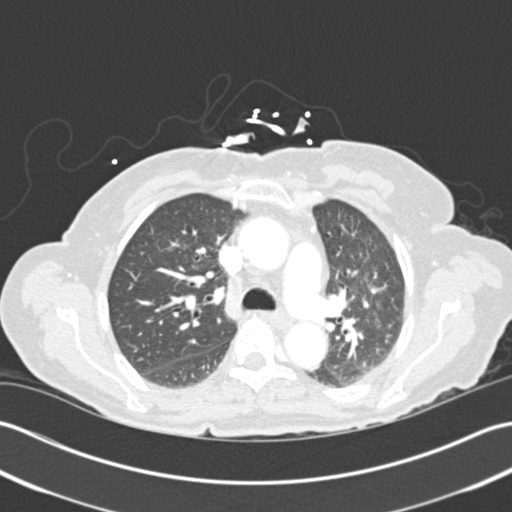
[im 196/261  mediastinal]
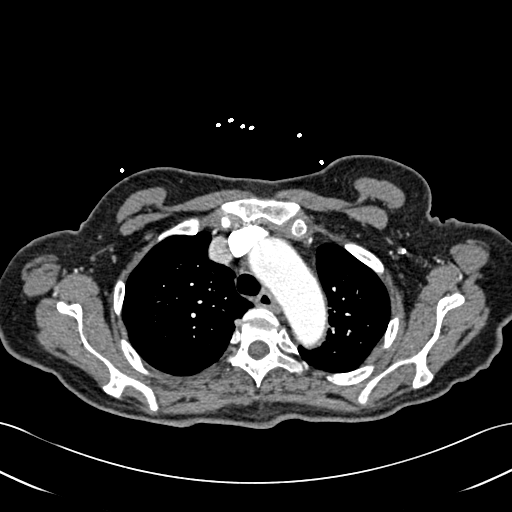
[im 196/261  lung]
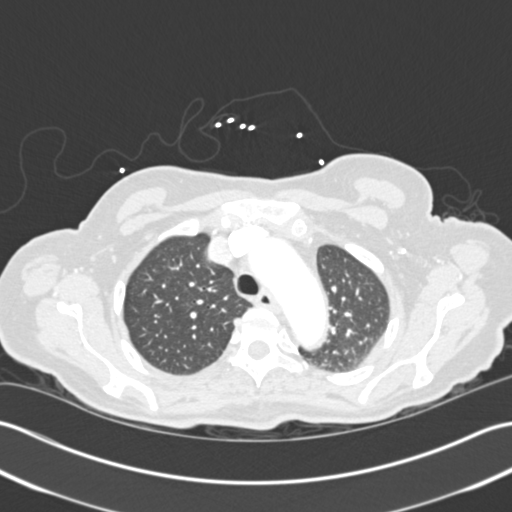
[im 217/261  lung]
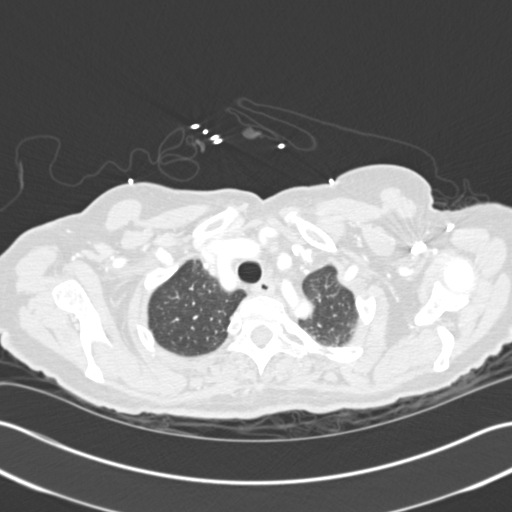
[im 239/261  lung]
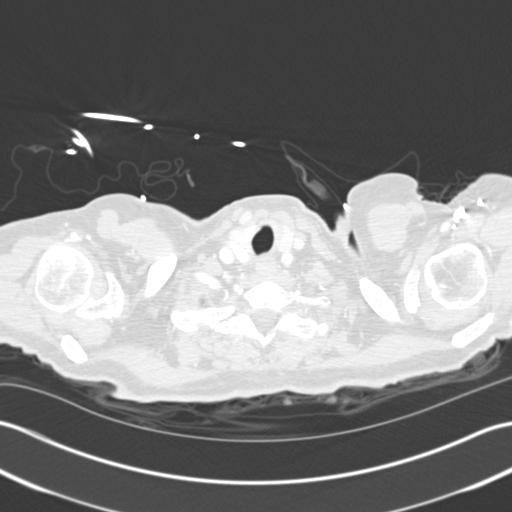

[15 of 30 positions shown; findings below may reference images not displayed]

FINDINGS: CTA CHEST FINDINGS

The lungs are well aerated bilaterally. No focal infiltrate or
sizable effusion is seen. The previously noted infiltrates have
resolved in the interval.

The thoracic inlet is within normal limits. The thoracic aorta and
its branches are unremarkable. No dissection or aneurysm is
identified.

The pulmonary artery is well visualized and demonstrates a normal
branching pattern. No filling defects to suggest pulmonary emboli
are identified. No significant hilar or mediastinal adenopathy is
seen.

The osseous structures show no acute abnormality.

CT ABDOMEN and PELVIS FINDINGS

The liver is shrunken with significant nodularity consistent with
underlying cirrhosis. The gallbladder has been surgically removed.
Some very mild perigastric varices are noted extending into the
distal esophagus. No significant perisplenic varices are seen.
Changes suggestive of prior fundoplication are seen in the proximal
stomach.

Mild ascites is noted predominately in the right lower quadrant as
well as surrounding the liver superiorly. The spleen is mildly
enlarged.

The adrenal glands and pancreas are within normal limits. The
kidneys are well visualized bilaterally with a small staghorn type
calculus in the lower pole of the right kidney. No obstructive
changes are seen.

Aortoiliac calcifications are noted without aneurysmal dilatation.
The bladder is well distended. The appendix is within normal limits.
No diverticulitis is seen. The osseous structures show no acute
abnormality.

Review of the MIP images confirms the above findings.
IMPRESSION: No evidence of pulmonary emboli.

Significant clearing of previously seen bilateral infiltrates.

Changes of cirrhosis with portal hypertension, ascites and varices.

Stable right renal calculus.

## 2017-12-03 IMAGING — DX DG CHEST 1V PORT
1 series · 1 of 1 positions shown · non-contrast
Comparison: 12/29/2015 and earlier.

CLINICAL DATA: 60-year-old female with tachycardia and chest pain
for 3 days with shortness of breath and weakness. Initial encounter.

EXAM:
PORTABLE CHEST 1 VIEW

[chest ap]
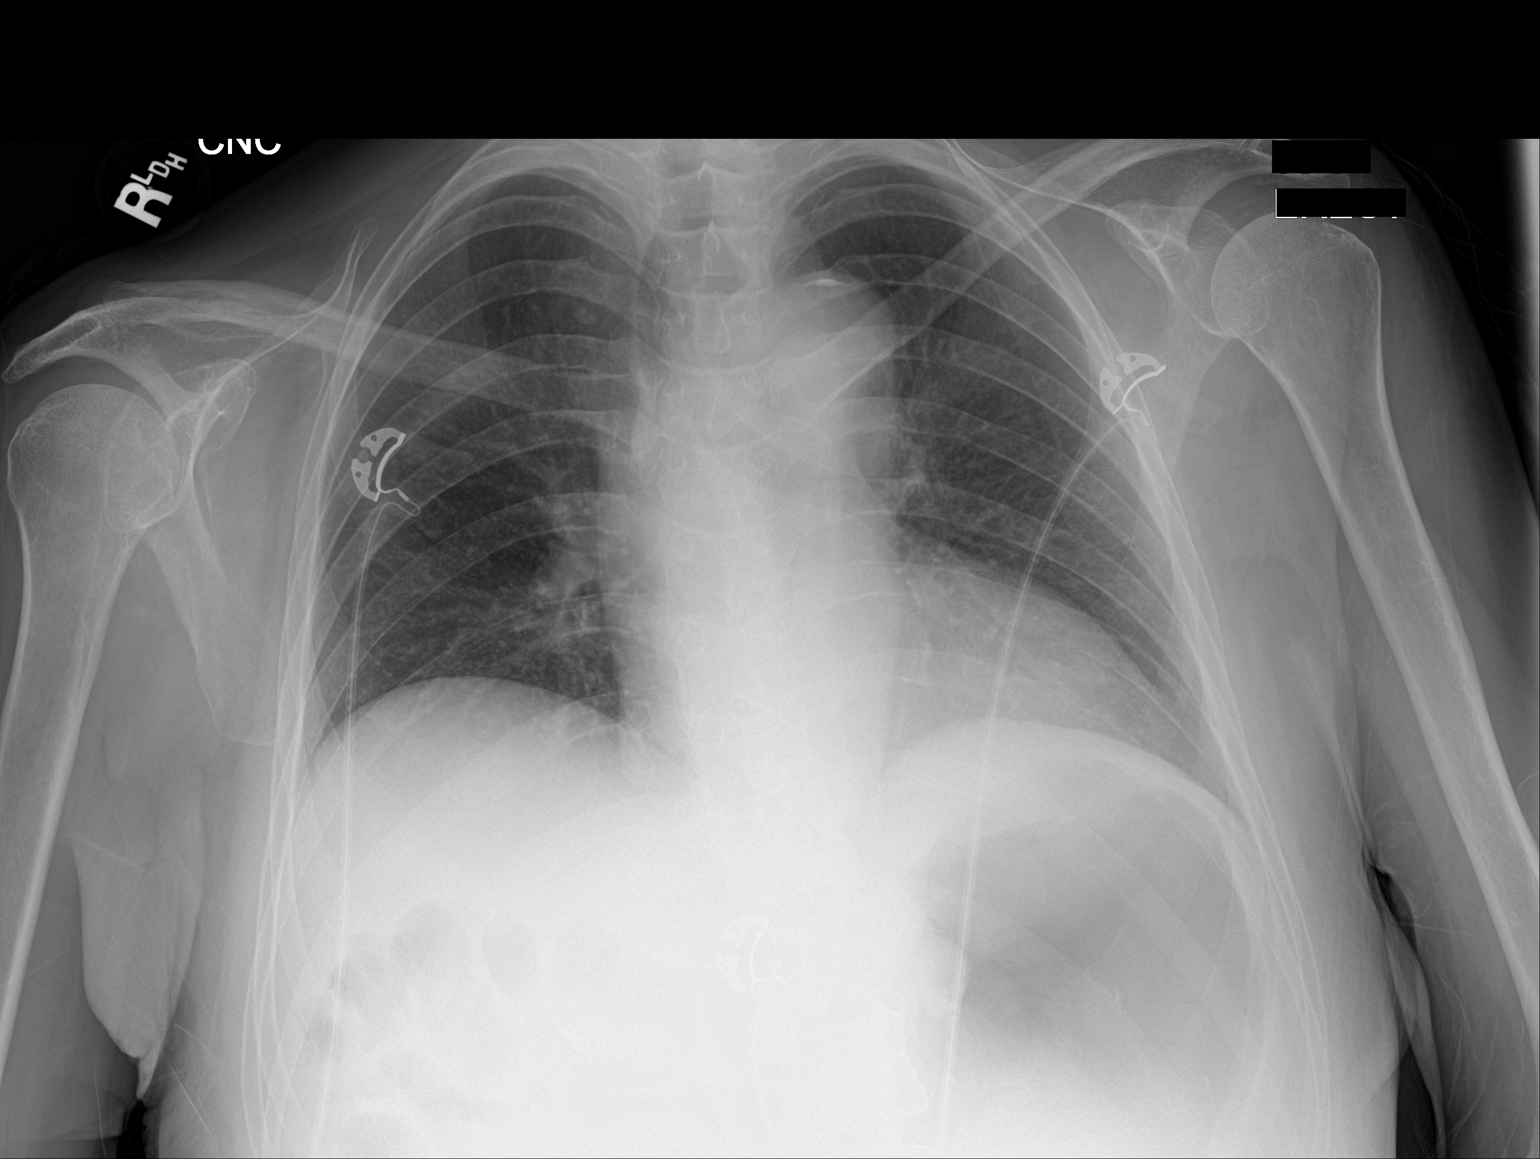

[1 of 1 positions shown; findings below may reference images not displayed]

FINDINGS: Portable AP upright view at 4478 hours. Low but improved lung
volumes. Stable cardiac size and mediastinal contours. Allowing for
portable technique, the lungs are clear. No pneumothorax or pleural
effusion.

Calcified aortic atherosclerosis.
IMPRESSION: No acute cardiopulmonary abnormality.

## 2017-12-05 IMAGING — CT CT HEAD W/O CM
1 series · 16 of 30 positions shown, 20 images · non-contrast
Comparison: December 27, 2015

CLINICAL DATA: History of stroke. Code blue after cardiac cath
today.

EXAM:
CT HEAD WITHOUT CONTRAST
TECHNIQUE: Contiguous axial images were obtained from the base of the skull
through the vertex without intravenous contrast.

[Series 2: head wo · axial · 0.38mm/px · z∈[-176,-36]mm · 16 of 32 slices shown, 20 images]
[im 2/32  brain]
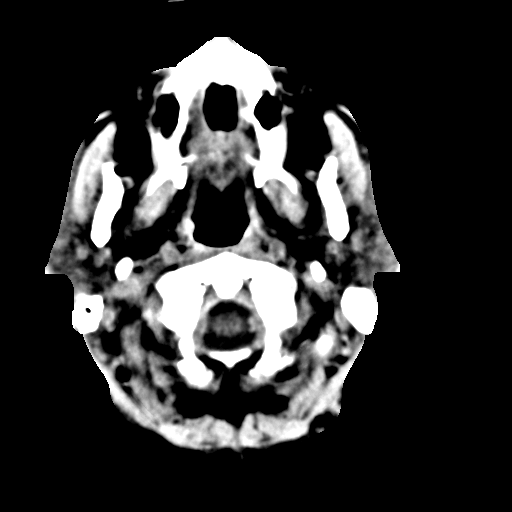
[im 2/32  bone]
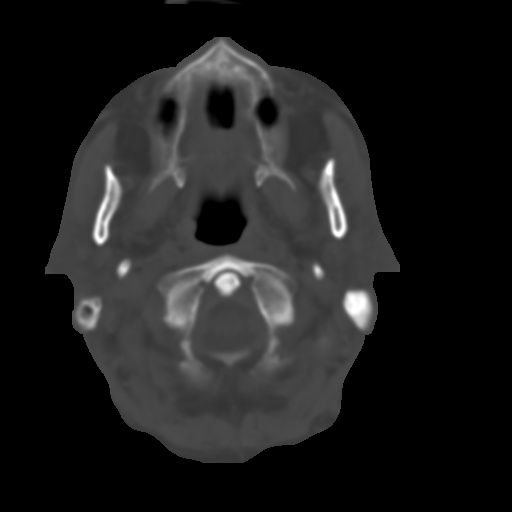
[im 4/32  brain]
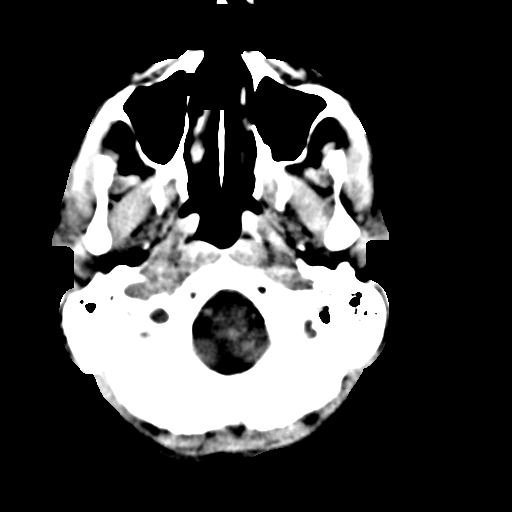
[im 6/32  brain]
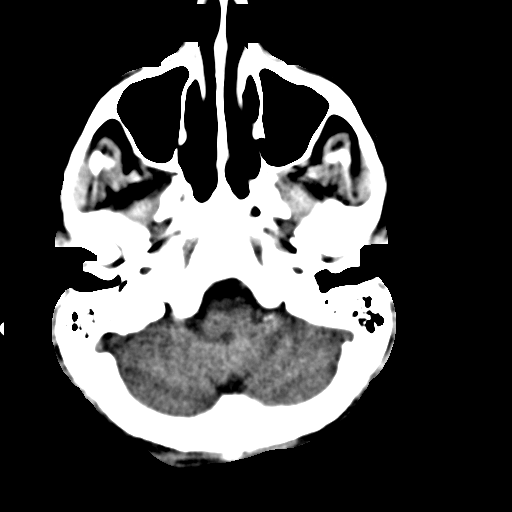
[im 8/32  brain]
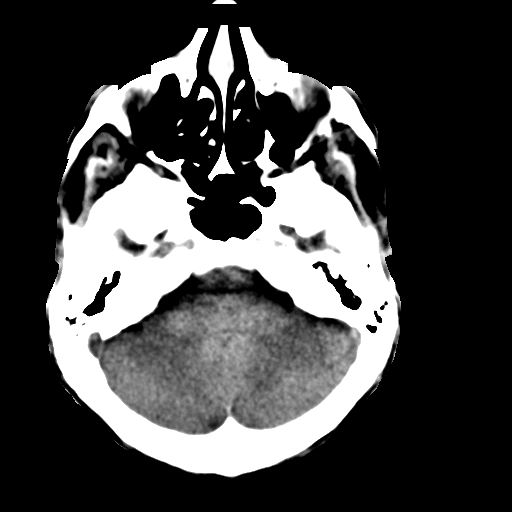
[im 9/32  brain]
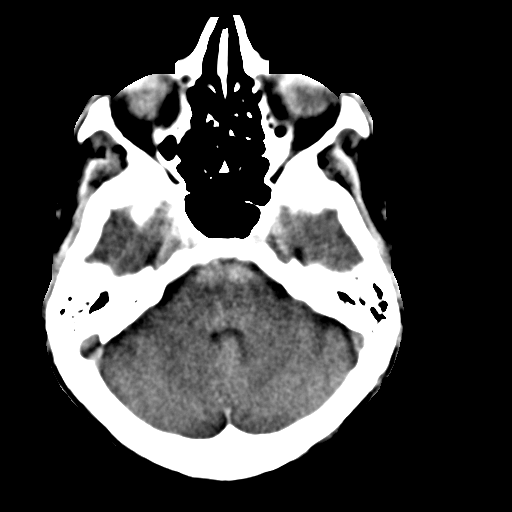
[im 9/32  bone]
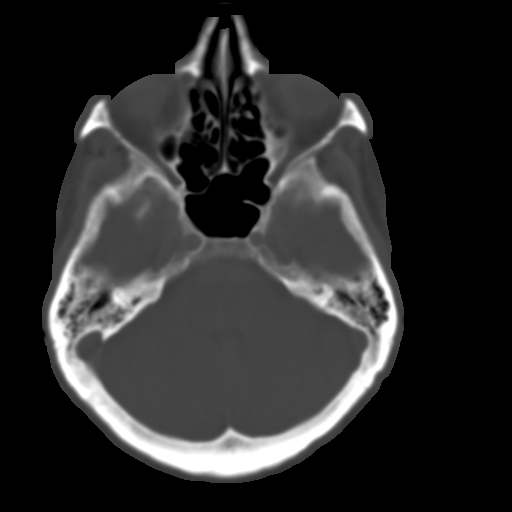
[im 11/32  brain]
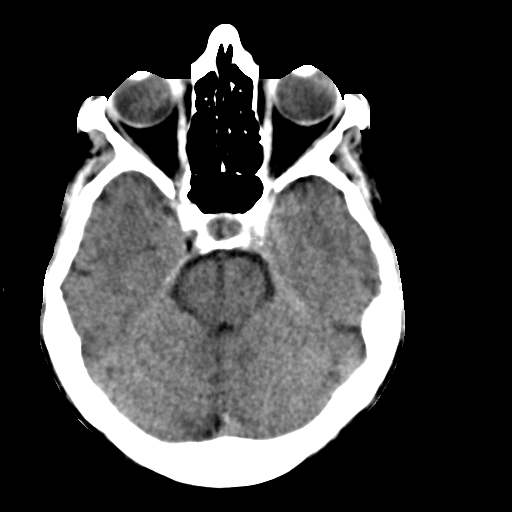
[im 13/32  brain]
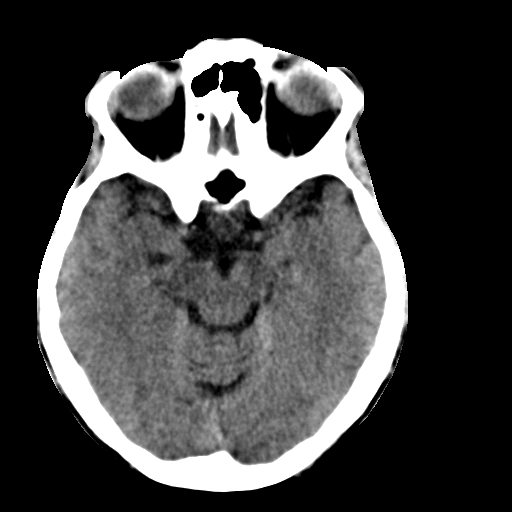
[im 15/32  brain]
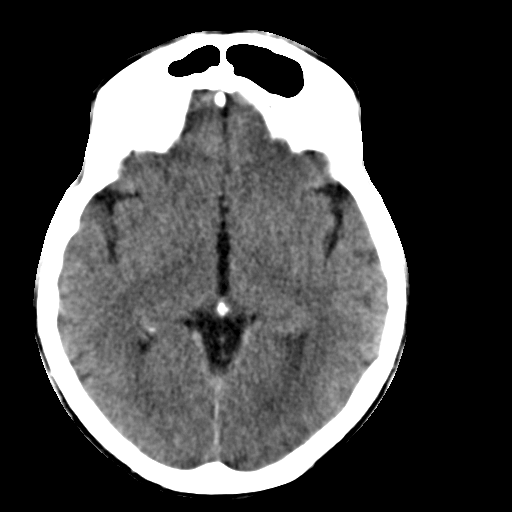
[im 17/32  brain]
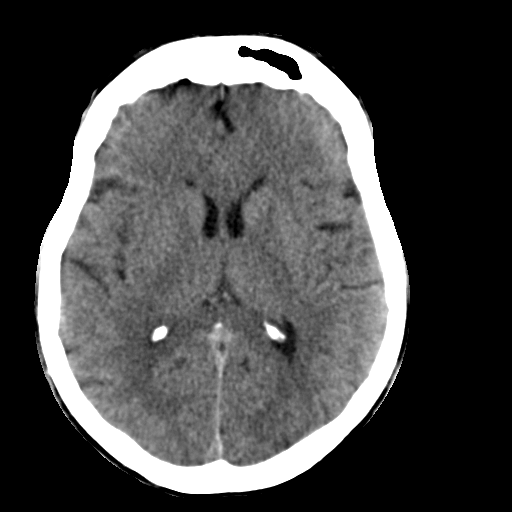
[im 17/32  bone]
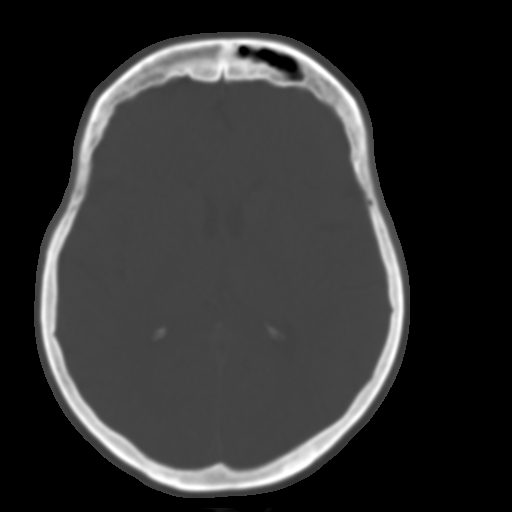
[im 19/32  brain]
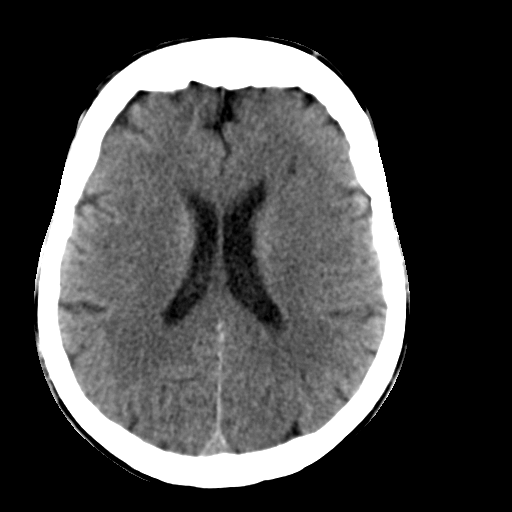
[im 21/32  brain]
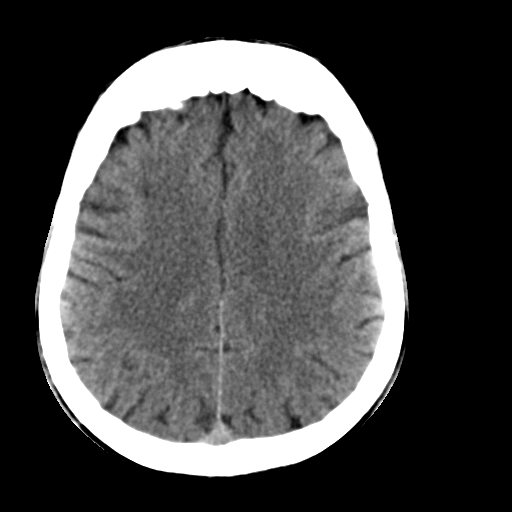
[im 23/32  brain]
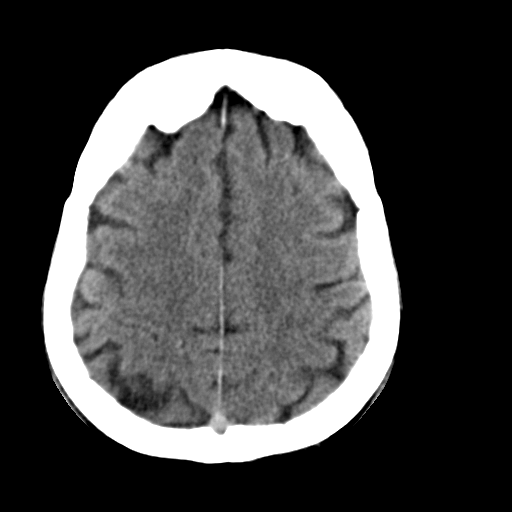
[im 24/32  brain]
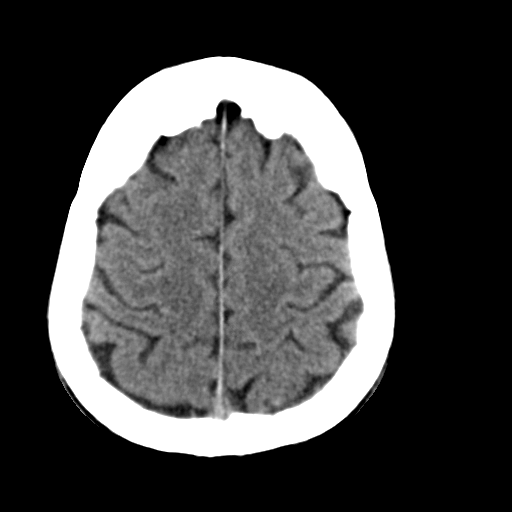
[im 24/32  bone]
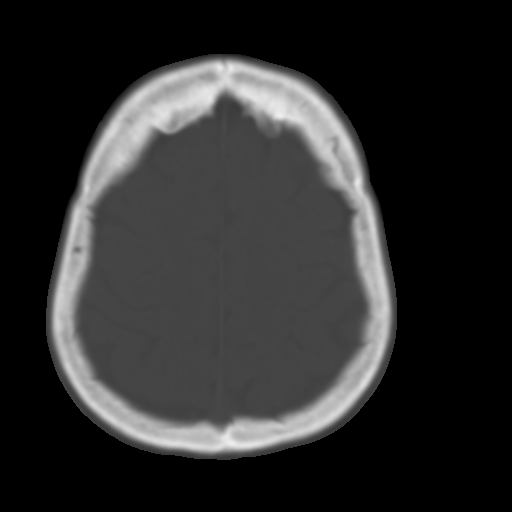
[im 26/32  brain]
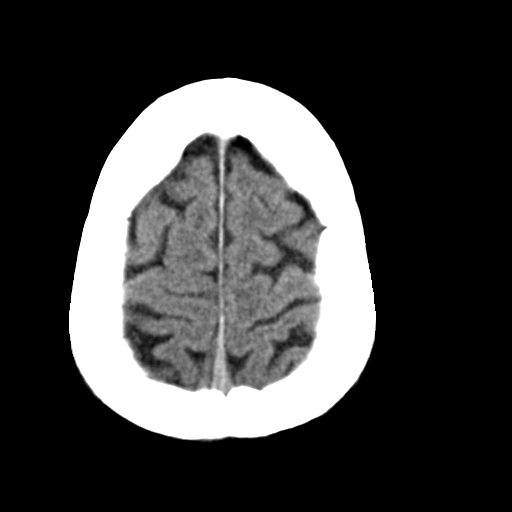
[im 28/32  brain]
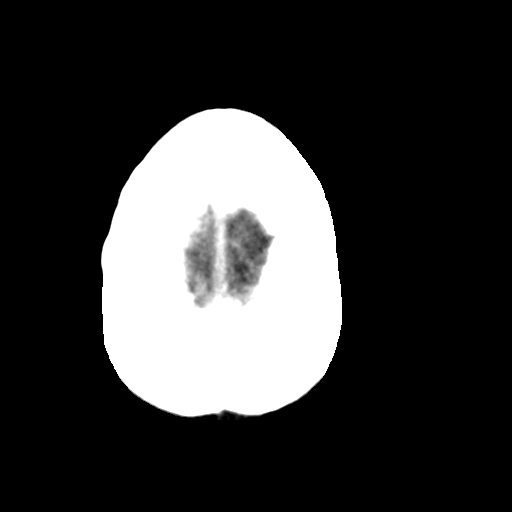
[im 30/32  brain]
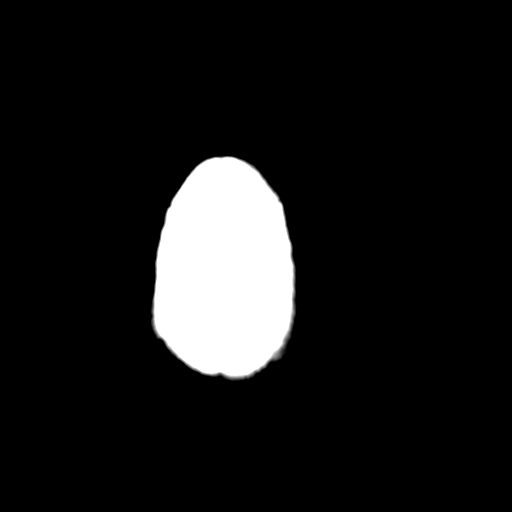

[16 of 30 positions shown; findings below may reference images not displayed]

FINDINGS: Paranasal sinuses, mastoid air cells, and bones are within normal
limits. Extracranial soft tissues are normal. No subdural, epidural,
or subarachnoid hemorrhage. No mass, mass effect or midline shift.
Ventricles and sulci are normal. No acute cortical ischemia or
infarct. The cerebellum, brainstem, and basal cisterns are normal.
No other acute abnormalities. There is an oval region of decreased
attenuation in the white matter of the left frontal lobe on series
2, image 19. This was present previously but is more conspicuous
today, likely due to interval resolution of a nonacute lacunar
infarct.
IMPRESSION: No acute intracranial process.

## 2017-12-08 IMAGING — CT CT ABD-PELV W/ CM
2 of 5 series · 15 of 46 positions shown, 17 images · IV contrast (iopamidol)
Comparison: 01/10/2016

CLINICAL DATA: Nausea, vomiting, generalized abdominal pain, seen
on 01/10/2016 for similar indications, history type II diabetes
mellitus, chronic superficial gastritis, hypertension, chronic
anemia, uterine cancer, cirrhosis, prior pancreatitis

EXAM:
CT ABDOMEN AND PELVIS WITH CONTRAST
TECHNIQUE: Multidetector CT imaging of the abdomen and pelvis was performed
using the standard protocol following bolus administration of
intravenous contrast. Sagittal and coronal MPR images reconstructed
from axial data set.
CONTRAST:  100mL 3JRCAI-2BB IOPAMIDOL (3JRCAI-2BB) INJECTION 61% IV.
Dilute oral contrast.

[Series 2: routine abd pel with · axial · 0.69mm/px · z∈[-990,-526]mm · 12 of 105 slices shown, 14 images]
[im 6/105  soft-tissue]
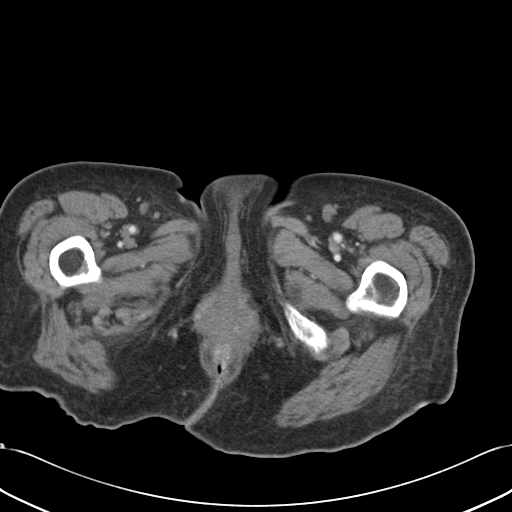
[im 6/105  bone]
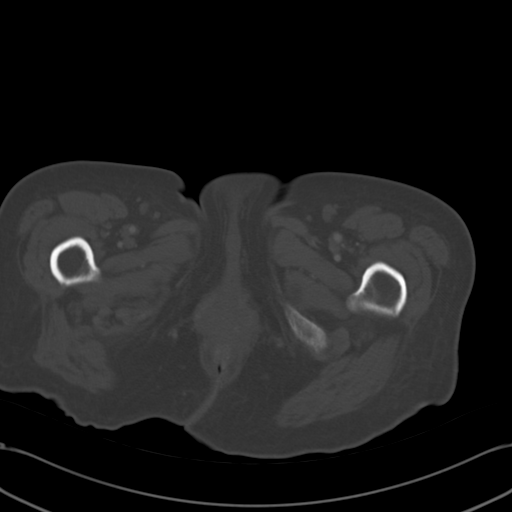
[im 17/105  soft-tissue]
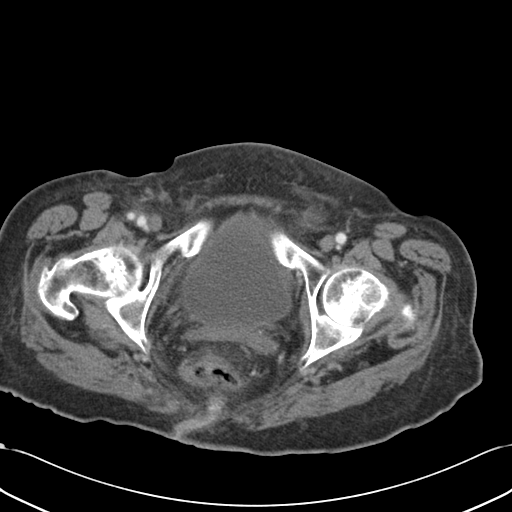
[im 22/105  soft-tissue]
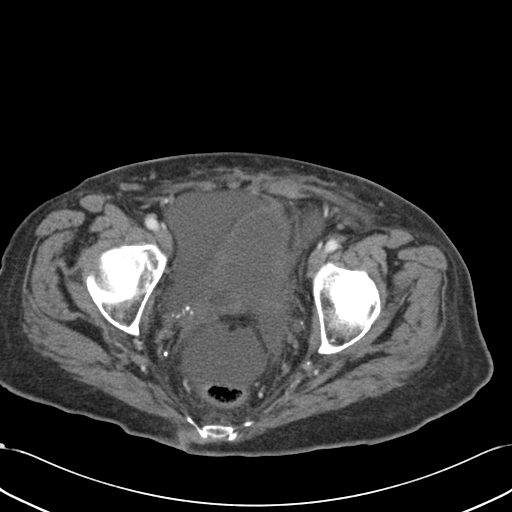
[im 33/105  soft-tissue]
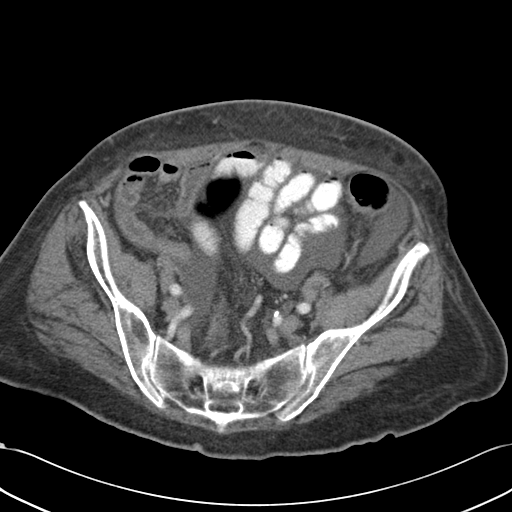
[im 39/105  soft-tissue]
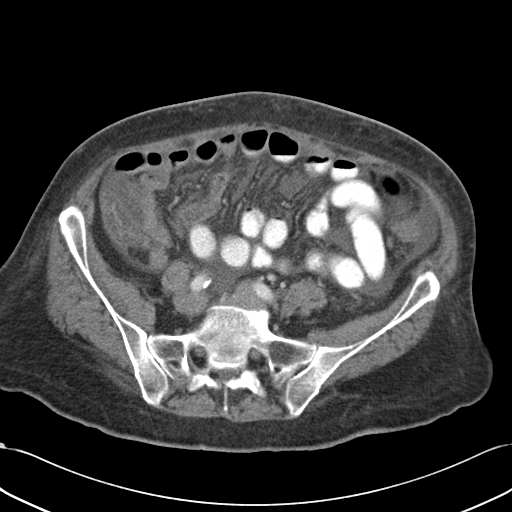
[im 50/105  soft-tissue]
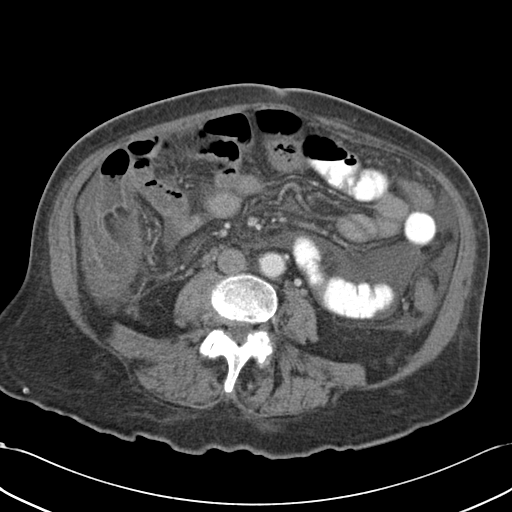
[im 55/105  soft-tissue]
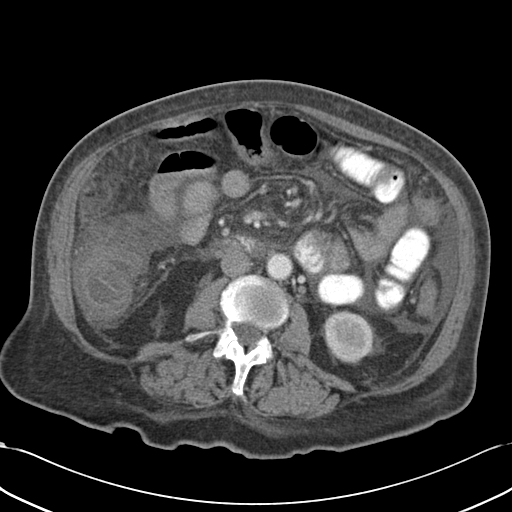
[im 66/105  soft-tissue]
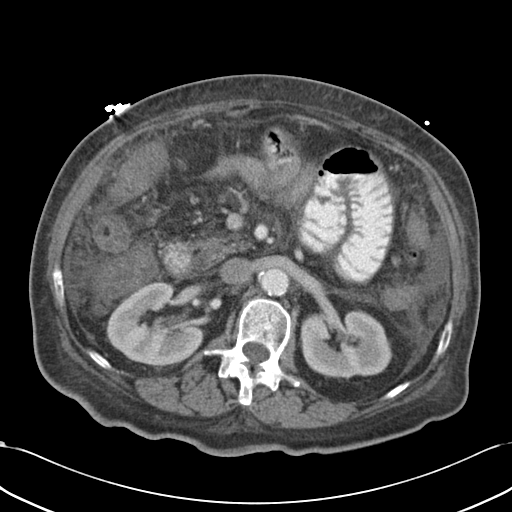
[im 72/105  soft-tissue]
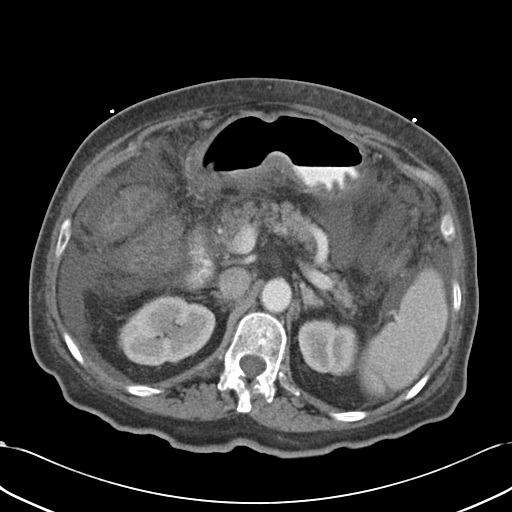
[im 72/105  bone]
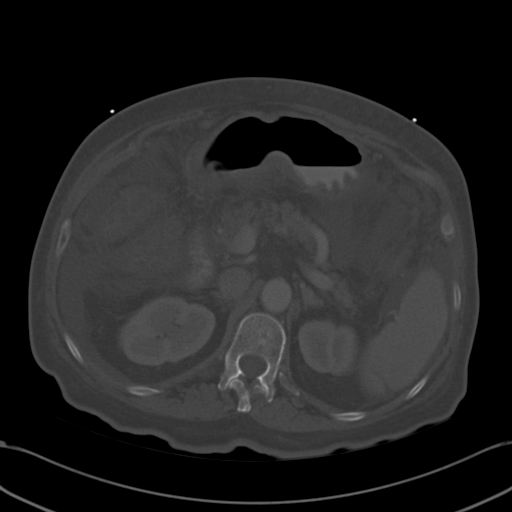
[im 83/105  soft-tissue]
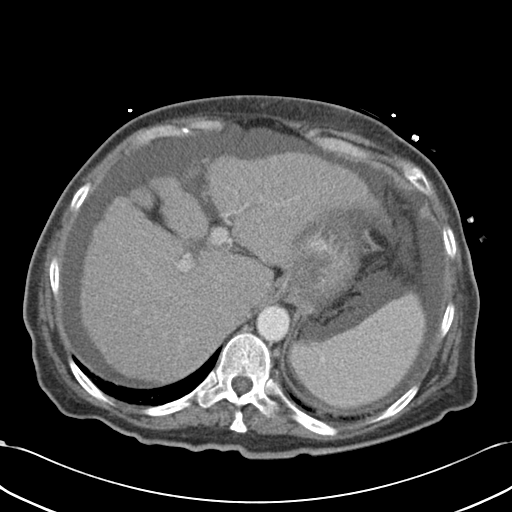
[im 88/105  soft-tissue]
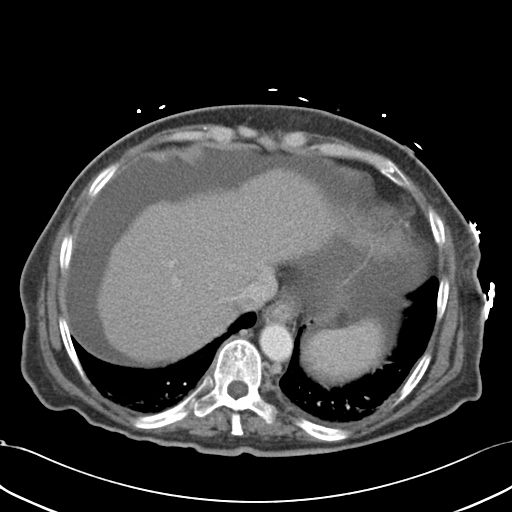
[im 99/105  soft-tissue]
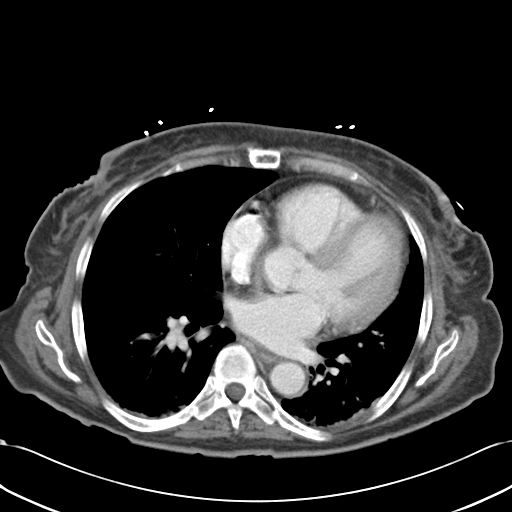

[Series 5: cor routine abd pel with · coronal · 0.65mm/px · 3 of 126 slices shown]
[im 42/126  soft-tissue]
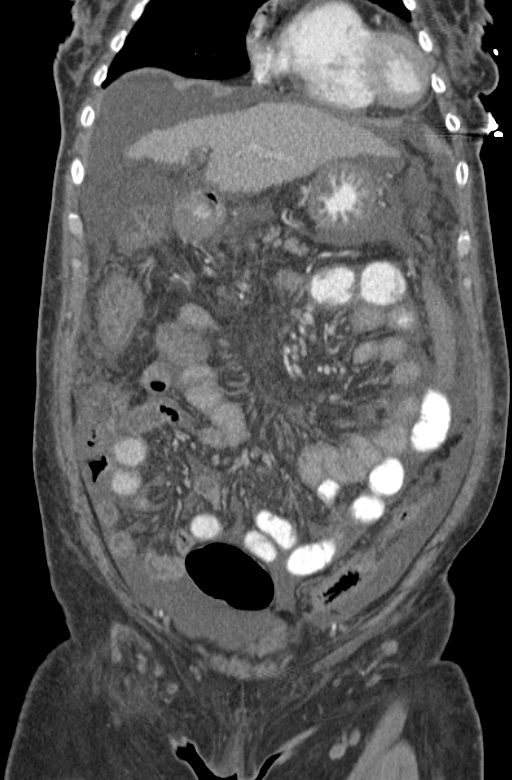
[im 56/126  soft-tissue]
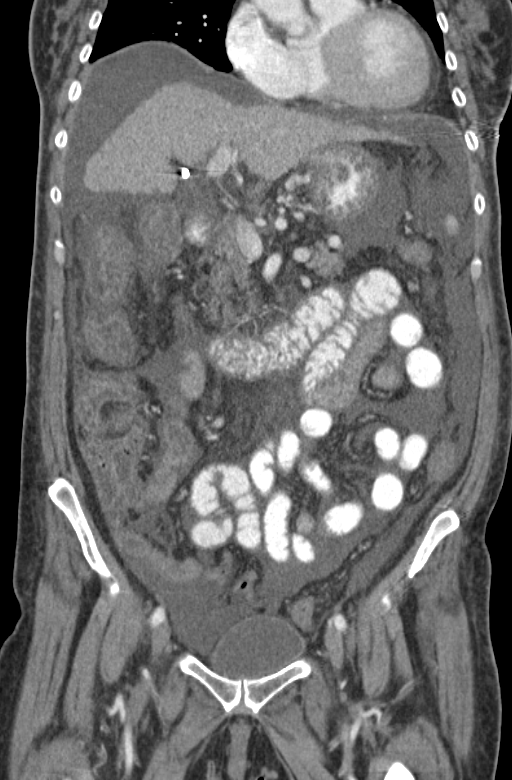
[im 70/126  soft-tissue]
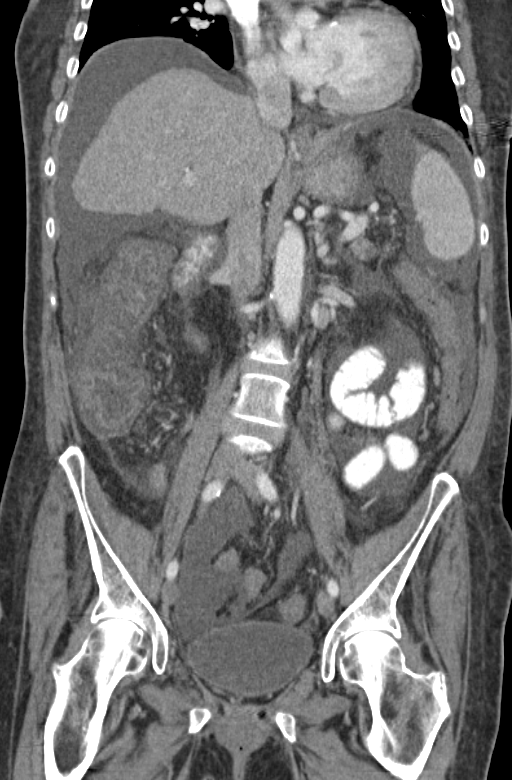

[15 of 46 positions shown; findings below may reference images not displayed]

FINDINGS: Bibasilar atelectasis dependently.

Nodular cirrhotic liver without focal mass.

Post cholecystectomy.

Nonobstructing 9 mm calculus inferior pole RIGHT kidney.

Tiny nonobstructing mid LEFT renal calculus.

Tiny RIGHT renal cyst.

Remainder of liver, spleen, pancreas, kidneys, and adrenal glands
normal.

Colonic wall thickening extending from cecum through descending
colon compatible with colitis.

Appendix not visualized.

Distal small bowel on opacified and incompletely distended with
suboptimal assessment of wall thickness.

Focal wall thickening of stomach at gastroesophageal junction
question prior fundoplication.

Remainder of stomach and small bowel loops unremarkable.

Normal appearing bladder and ureters.

Uterus surgically absent with nonvisualization of ovaries.

Significant ascites.

Recanalization of the umbilical vein and few perigastric varices
noted.

Scattered atherosclerotic calcifications with major vessels patent.

No mass, adenopathy, free air, hernia, or acute bone lesion.
IMPRESSION: Cirrhotic liver with ascites.

Nonobstructing renal calculi.

Diffuse colonic wall thickening from cecum through descending colon
compatible with colitis ; differential diagnosis includes infection,
inflammatory bowel disease, ischemia much less likely.

## 2017-12-09 IMAGING — NM NM GASTRIC EMPTYING
6 series · 20 of 20 positions shown · non-contrast
Comparison: None.

CLINICAL DATA: Nausea and vomiting for 4 days.

EXAM:
NUCLEAR MEDICINE GASTRIC EMPTYING SCAN
TECHNIQUE: After oral ingestion of radiolabeled meal, sequential abdominal
images were obtained for 4 hours. Percentage of activity emptying
the stomach was calculated at 1 hour, 2 hour, 3 hour, and 4 hours.
RADIOPHARMACEUTICALS:  2.02 mCi Tc-IIm MDP labeled sulfur colloid
orally

[Series 1000: gatric statics · 3.90mm/px · 2 of 2 frames shown (1 of 2)]
[frame 1/2]
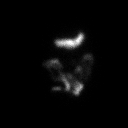
[frame 2/2]
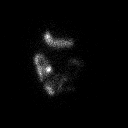

[Series 1000: gatric statics · 3.90mm/px · 2 of 2 frames shown (2 of 2)]
[frame 1/2]
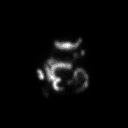
[frame 2/2]
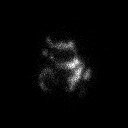

[Series 1000: gatric statics (results) · 3.90mm/px · 5 acquisitions, 10 frames shown]
[im 1/5]
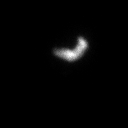
[im 1/5]
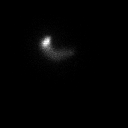
[im 2/5]
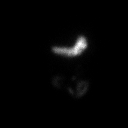
[im 2/5]
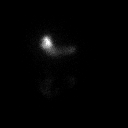
[im 3/5]
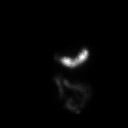
[im 3/5]
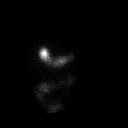
[im 4/5]
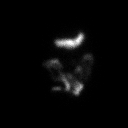
[im 4/5]
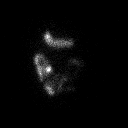
[im 5/5]
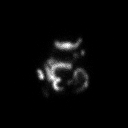
[im 5/5]
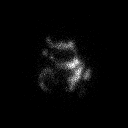

[Series 1000: gastric statics · 3.90mm/px · 2 of 2 frames shown (1 of 3)]
[frame 1/2]
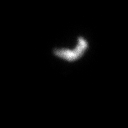
[frame 2/2]
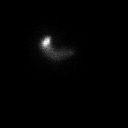

[Series 1000: gastric statics · 3.90mm/px · 2 of 2 frames shown (2 of 3)]
[frame 1/2]
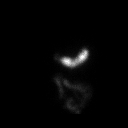
[frame 2/2]
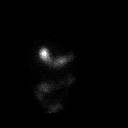

[Series 1000: gastric statics · 3.90mm/px · 2 of 2 frames shown (3 of 3)]
[frame 1/2]
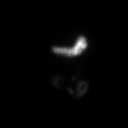
[frame 2/2]
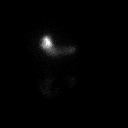

[20 of 20 positions shown; findings below may reference images not displayed]

FINDINGS: Expected location of the stomach in the left upper quadrant.
Ingested meal empties the stomach gradually over the course of the
study.

44% emptied at 1 hr ( normal >= 10%)

51% emptied at 2 hr ( normal >= 40%)

70% emptied at 3 hr ( normal >= 70%)

78% emptied at 4 hr ( normal >= 90%)
IMPRESSION: Delayed gastric emptying study.

## 2017-12-14 ENCOUNTER — Emergency Department: Payer: Medicare HMO

## 2017-12-14 ENCOUNTER — Inpatient Hospital Stay
Admission: EM | Admit: 2017-12-14 | Discharge: 2017-12-17 | DRG: 871 | Disposition: A | Discharge status: Home / Self Care | Payer: Medicare HMO | Attending: Specialist | Admitting: Specialist

## 2017-12-14 ENCOUNTER — Other Ambulatory Visit: Payer: Self-pay

## 2017-12-14 DIAGNOSIS — K3184 Gastroparesis: Secondary | ICD-10-CM | POA: Diagnosis present

## 2017-12-14 DIAGNOSIS — E785 Hyperlipidemia, unspecified: Secondary | ICD-10-CM

## 2017-12-14 DIAGNOSIS — E119 Type 2 diabetes mellitus without complications: Secondary | ICD-10-CM | POA: Diagnosis not present

## 2017-12-14 DIAGNOSIS — N390 Urinary tract infection, site not specified: Secondary | ICD-10-CM

## 2017-12-14 DIAGNOSIS — R4182 Altered mental status, unspecified: Secondary | ICD-10-CM | POA: Diagnosis not present

## 2017-12-14 DIAGNOSIS — K219 Gastro-esophageal reflux disease without esophagitis: Secondary | ICD-10-CM

## 2017-12-14 DIAGNOSIS — Z794 Long term (current) use of insulin: Secondary | ICD-10-CM

## 2017-12-14 DIAGNOSIS — Z8542 Personal history of malignant neoplasm of other parts of uterus: Secondary | ICD-10-CM

## 2017-12-14 DIAGNOSIS — Z8673 Personal history of transient ischemic attack (TIA), and cerebral infarction without residual deficits: Secondary | ICD-10-CM | POA: Diagnosis not present

## 2017-12-14 DIAGNOSIS — E876 Hypokalemia: Secondary | ICD-10-CM

## 2017-12-14 DIAGNOSIS — E039 Hypothyroidism, unspecified: Secondary | ICD-10-CM | POA: Diagnosis present

## 2017-12-14 DIAGNOSIS — E871 Hypo-osmolality and hyponatremia: Secondary | ICD-10-CM

## 2017-12-14 DIAGNOSIS — Z79891 Long term (current) use of opiate analgesic: Secondary | ICD-10-CM

## 2017-12-14 DIAGNOSIS — Z79899 Other long term (current) drug therapy: Secondary | ICD-10-CM | POA: Diagnosis not present

## 2017-12-14 DIAGNOSIS — Z7901 Long term (current) use of anticoagulants: Secondary | ICD-10-CM

## 2017-12-14 DIAGNOSIS — E860 Dehydration: Secondary | ICD-10-CM | POA: Diagnosis not present

## 2017-12-14 DIAGNOSIS — G9341 Metabolic encephalopathy: Secondary | ICD-10-CM | POA: Diagnosis not present

## 2017-12-14 DIAGNOSIS — A419 Sepsis, unspecified organism: Principal | ICD-10-CM

## 2017-12-14 DIAGNOSIS — Z9114 Patient's other noncompliance with medication regimen: Secondary | ICD-10-CM

## 2017-12-14 DIAGNOSIS — E440 Moderate protein-calorie malnutrition: Secondary | ICD-10-CM | POA: Diagnosis present

## 2017-12-14 DIAGNOSIS — K7460 Unspecified cirrhosis of liver: Secondary | ICD-10-CM | POA: Diagnosis present

## 2017-12-14 DIAGNOSIS — G894 Chronic pain syndrome: Secondary | ICD-10-CM | POA: Diagnosis present

## 2017-12-14 DIAGNOSIS — E1065 Type 1 diabetes mellitus with hyperglycemia: Secondary | ICD-10-CM | POA: Diagnosis not present

## 2017-12-14 DIAGNOSIS — E1129 Type 2 diabetes mellitus with other diabetic kidney complication: Secondary | ICD-10-CM

## 2017-12-14 DIAGNOSIS — N179 Acute kidney failure, unspecified: Secondary | ICD-10-CM | POA: Diagnosis not present

## 2017-12-14 DIAGNOSIS — M549 Dorsalgia, unspecified: Secondary | ICD-10-CM

## 2017-12-14 DIAGNOSIS — R4020 Unspecified coma: Secondary | ICD-10-CM | POA: Diagnosis not present

## 2017-12-14 DIAGNOSIS — R739 Hyperglycemia, unspecified: Secondary | ICD-10-CM

## 2017-12-14 DIAGNOSIS — R809 Proteinuria, unspecified: Secondary | ICD-10-CM

## 2017-12-14 DIAGNOSIS — R296 Repeated falls: Secondary | ICD-10-CM | POA: Diagnosis not present

## 2017-12-14 DIAGNOSIS — R402441 Other coma, without documented Glasgow coma scale score, or with partial score reported, in the field [EMT or ambulance]: Secondary | ICD-10-CM | POA: Diagnosis not present

## 2017-12-14 DIAGNOSIS — I480 Paroxysmal atrial fibrillation: Secondary | ICD-10-CM | POA: Diagnosis not present

## 2017-12-14 DIAGNOSIS — E1043 Type 1 diabetes mellitus with diabetic autonomic (poly)neuropathy: Secondary | ICD-10-CM | POA: Diagnosis present

## 2017-12-14 DIAGNOSIS — I10 Essential (primary) hypertension: Secondary | ICD-10-CM | POA: Diagnosis present

## 2017-12-14 DIAGNOSIS — Z6825 Body mass index (BMI) 25.0-25.9, adult: Secondary | ICD-10-CM

## 2017-12-14 DIAGNOSIS — Z87891 Personal history of nicotine dependence: Secondary | ICD-10-CM | POA: Diagnosis not present

## 2017-12-14 LAB — GLUCOSE, CAPILLARY: Glucose-Capillary: >600 mg/dL (ref 65–99)

## 2017-12-14 IMAGING — CR DG ABDOMEN ACUTE W/ 1V CHEST
4 series · 4 of 4 positions shown · non-contrast
Comparison: CT scan 01/15/2016

CLINICAL DATA: Epigastric pains starting 01/19/2016

EXAM:
DG ABDOMEN ACUTE W/ 1V CHEST

[abdomen erect]
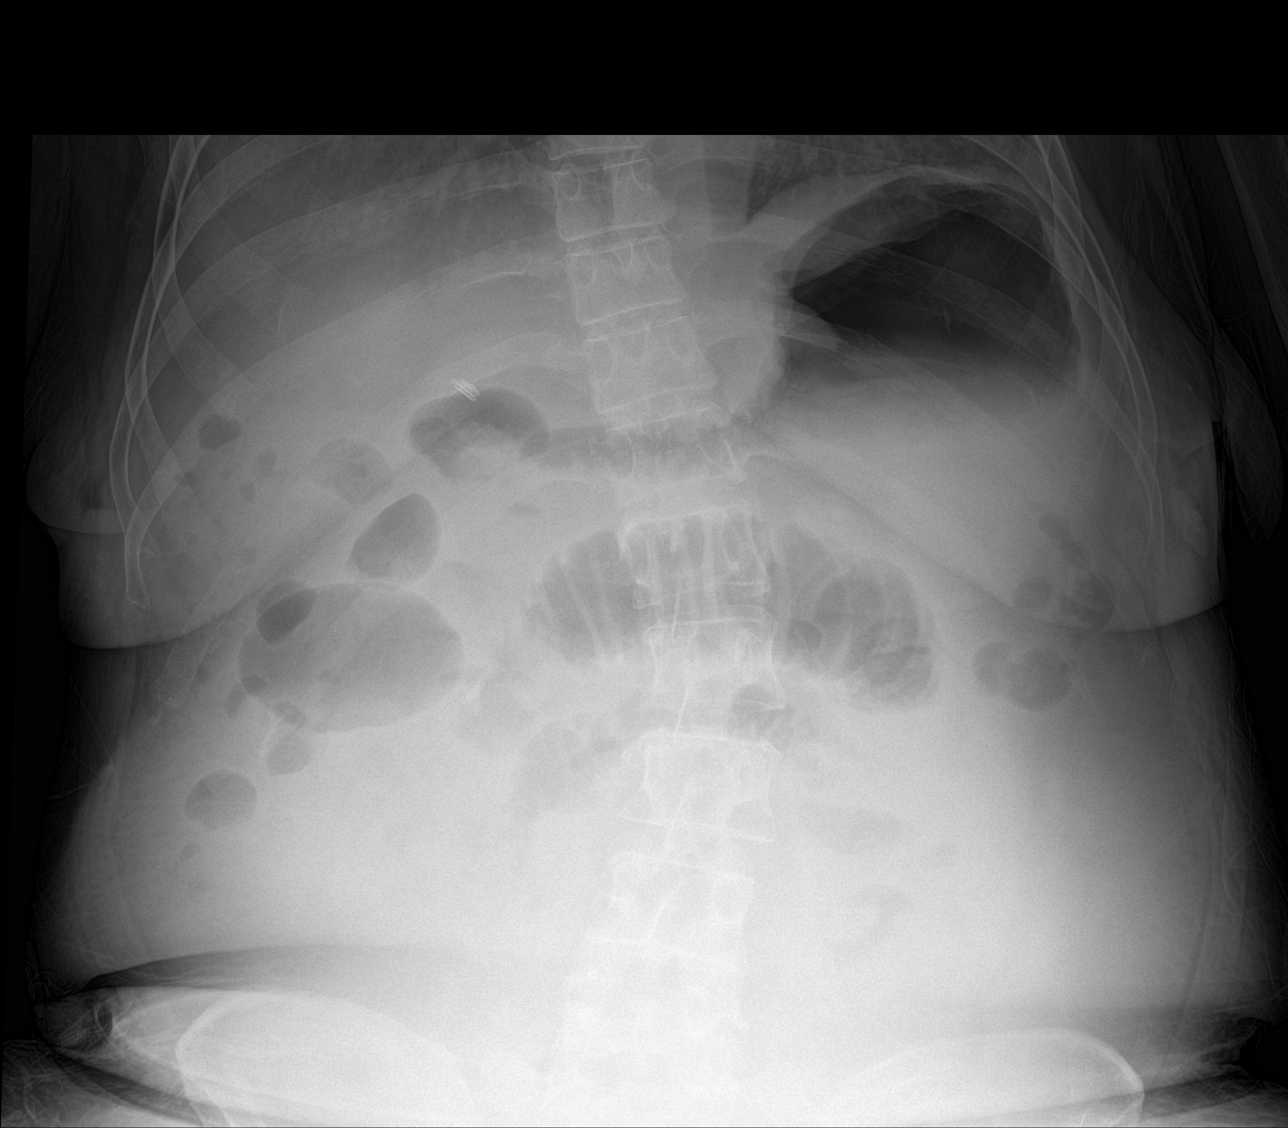

[abdomen supine (1 of 2)]
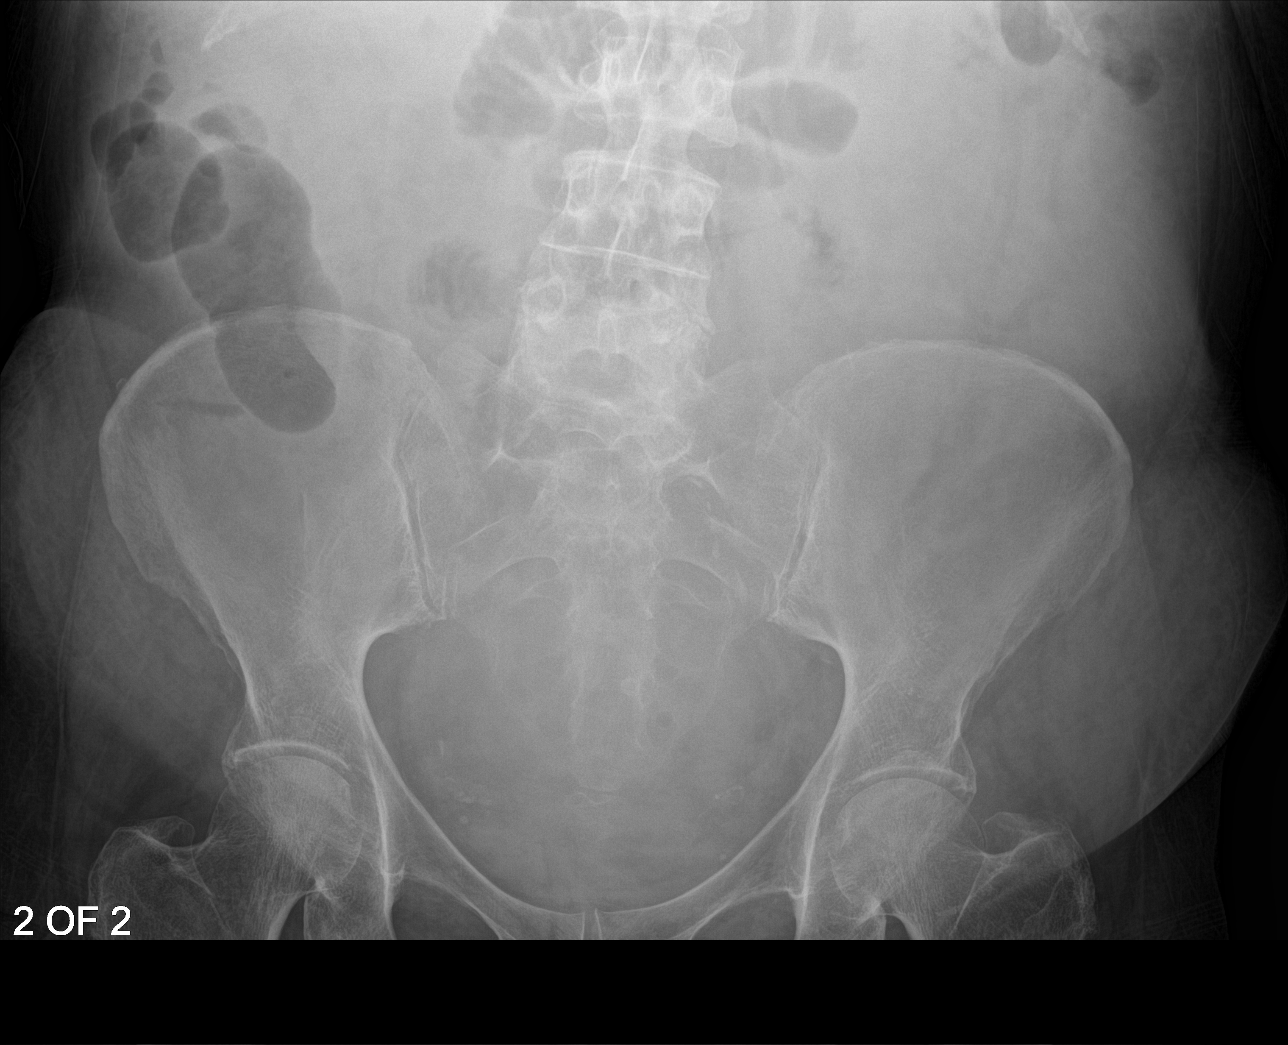

[abdomen supine (2 of 2)]
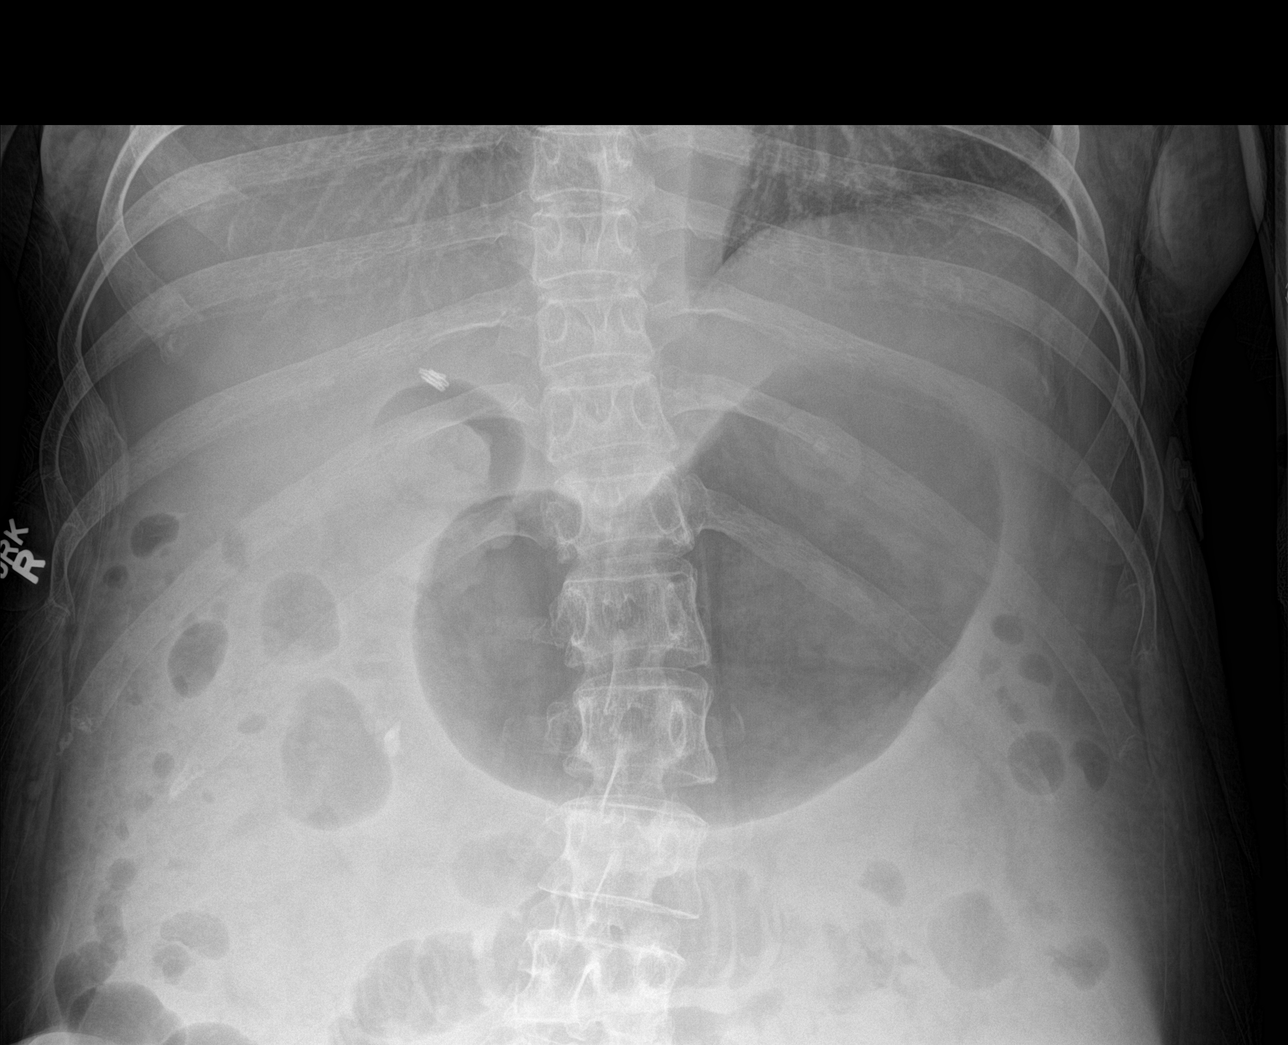

[chest ap]
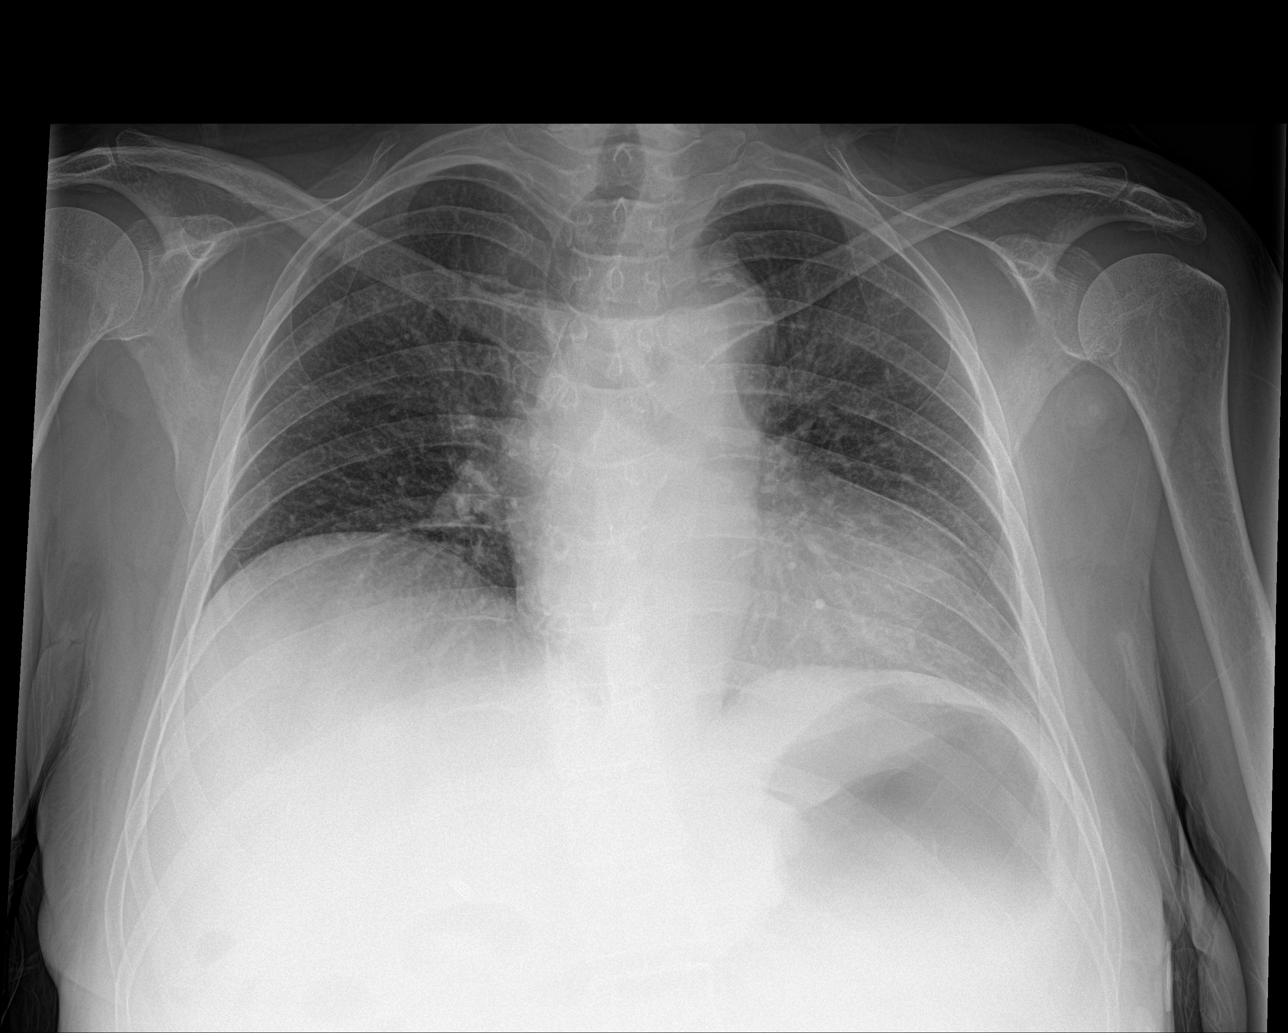

[4 of 4 positions shown; findings below may reference images not displayed]

FINDINGS: There is moderate gaseous distension of the stomach suspicious for
gastric ileus or paresis. Mild gaseous distended small bowel loops
in mid abdomen. Ileus or early small bowel obstruction cannot be
excluded. No evidence of free abdominal air. Cardiomediastinal
silhouette is unremarkable. There is chronic elevation of the right
hemidiaphragm. No acute infiltrate or pulmonary edema.
IMPRESSION: There is moderate gaseous distension of the stomach suspicious for
gastric ileus or paresis. Mild gaseous distended small bowel loops
in mid abdomen. Ileus or early small bowel obstruction cannot be
excluded. No evidence of free abdominal air. No acute disease within
chest.

## 2017-12-14 MED ORDER — SODIUM CHLORIDE 0.9 % IV BOLUS
1000.0000 mL | Freq: Once | INTRAVENOUS | Status: AC
  Administered 2017-12-15: 1000 mL via INTRAVENOUS

## 2017-12-14 NOTE — ED Provider Notes (Signed)
[facility] Emergency Department Provider Note   ____________________________________________   First MD Initiated Contact with Patient 12/14/17 2347     (approximate)  I have reviewed the triage vital signs and the nursing notes.   HISTORY  Chief Complaint Altered Mental Status and Weakness  5 caveat: History limited secondary to altered mentation  HPI [name] is a 62 y.o. female brought to the ED from home via EMS with a chief complaint of altered mentation.  Patient has a history of chronic pain syndrome, paroxysmal atrial fibrillation, hypertension, cirrhosis secondary to alcohol abuse, uncontrolled type 2 diabetes whose husband called EMS because patient was weak and had "collapsed" to the floor.  Patient tells me she has not eaten any food for 7-8 days because she does not feel hungry.  Rest of history is limited secondary to altered mentation.   Past Medical History:  Diagnosis Date  . Allergy   . Anxiety   . Cancer (HCC)    HX OF CANCER OF UTERUS   . Cirrhosis of liver not due to alcohol ([place]) 2016  . Degenerative disk disease   . Diverticulitis   . Gastroparesis   . GERD (gastroesophageal reflux disease)   . History of hiatal hernia   . Hypertension   . Hypothyroid   . Intussusception intestine ([place]) 05/2015  . PAF (paroxysmal atrial fibrillation) ([place]) 03/2015   a. new onset 03/2015 in setting of intractable N/V; b. on Eliquis 5 mg bid; c. CHADSVASc 4 (DM, TIA x 2, female)  . Pancreatitis   . Sick sinus syndrome ([place])   . Stomach ulcer   . Stroke [facility])    with minimal left sided weakness  . Syncope 01/2015  . TIA (transient ischemic attack) 02/2015  . Type 1 diabetes ([place])    on levemir    Patient Active Problem List   Diagnosis Date Noted  . Sepsis ([place]) 12/15/2017  . Right ureteral stone 07/14/2016  . Personal history of surgery to heart and great vessels, presenting hazards to health   . Gastritis   . Foreign body in  stomach   . Abnormal findings-gastrointestinal tract   . Congestive dilated cardiomyopathy ([place])   . Generalized abdominal pain   . Nausea & vomiting   . NSTEMI (non-ST elevated myocardial infarction) ([place]) 01/11/2016  . Tachyarrhythmia 01/10/2016  . Tachy-brady syndrome ([place]) 12/28/2015  . Colitis 12/16/2015  . Pneumonia 11/14/2015  . Narcotic withdrawal ([place]) 11/11/2015  . Orthostatic hypotension   . H/O TIA (transient ischemic attack) and stroke   . Left-sided weakness 10/04/2015  . Ileus ([place]) 08/01/2015  . Ascites   . Cryptogenic cirrhosis ([place]) 07/10/2015  . Paroxysmal atrial fibrillation ([place]) 07/10/2015  . C. difficile colitis 07/10/2015  . GI (gastrointestinal bleed) 07/06/2015  . Malnutrition of moderate degree 07/04/2015  . Symptomatic bradycardia 05/27/2015  . Intussusception intestine ([place])   . Hypothyroidism due to amiodarone   . Abdominal pain, chronic, epigastric   . Chronic anemia 05/19/2015  . Thrombocytopenia ([place]) 05/19/2015  . Prolonged QT interval   . Hypomagnesemia   . Narcotic abuse ([place])   . Uncontrolled type 2 diabetes mellitus ([place])   . Syncope due to orthostatic hypotension 05/18/2015  . Hypokalemia 04/06/2015  . Hyperlipidemia with target LDL less than 100 04/06/2015  . CVA (cerebral infarction) 02/15/2015  . Essential hypertension 01/12/2015  . Chronically on opiate therapy 01/12/2015  . Gastroparesis 01/12/2015  . DEPRESSION/ANXIETY 06/27/2007  . MYOFASCIAL PAIN SYNDROME 06/27/2007  .  Chronic pain syndrome 03/28/2007  . GERD 03/27/2007  . DIVERTICULOSIS, COLON 03/27/2007  . LUMBAR DISC DISPLACEMENT 03/27/2007  . PROTEINURIA 03/27/2007  . UTERINE CANCER, HX OF 03/27/2007    Past Surgical History:  Procedure Laterality Date  . ABDOMINAL HYSTERECTOMY    . CARDIAC CATHETERIZATION N/A 01/12/2016   Procedure: Left Heart Cath and Coronary Angiography;  Surgeon: Wellington Hampshire, MD;  Location: [facility];  Service: Cardiovascular;   Laterality: N/A;  . CHOLECYSTECTOMY    . CYSTOSCOPY/URETEROSCOPY/HOLMIUM LASER Right 07/14/2016   Procedure: CYSTOSCOPY/URETEROSCOPY/HOLMIUM LASER;  Surgeon: Alexis Frock, MD;  Location: [facility] ORS;  Service: Urology;  Laterality: Right;  . ESOPHAGOGASTRODUODENOSCOPY N/A 04/04/2015   Procedure: ESOPHAGOGASTRODUODENOSCOPY (EGD);  Surgeon: Hulen Luster, MD;  Location: [facility] ENDOSCOPY;  Service: Endoscopy;  Laterality: N/A;  . ESOPHAGOGASTRODUODENOSCOPY (EGD) WITH PROPOFOL N/A 01/18/2016   Procedure: ESOPHAGOGASTRODUODENOSCOPY (EGD) WITH PROPOFOL;  Surgeon: Lucilla Lame, MD;  Location: [facility] ENDOSCOPY;  Service: Endoscopy;  Laterality: N/A;  . FLEXIBLE SIGMOIDOSCOPY N/A 01/18/2016   Procedure: FLEXIBLE SIGMOIDOSCOPY;  Surgeon: Lucilla Lame, MD;  Location: [facility] ENDOSCOPY;  Service: Endoscopy;  Laterality: N/A;  . HERNIA REPAIR      Prior to Admission medications   Medication Sig Start Date End Date Taking? Authorizing Provider  cyclobenzaprine (FLEXERIL) 10 MG tablet Take 10 mg by mouth every 12 (twelve) hours as needed for muscle spasms.  11/18/17  Yes [provider]  XTAMPZA ER 9 MG C12A Take 9 mg by mouth every 8 (eight) hours. 11/18/17  Yes [provider]  acidophilus (RISAQUAD) CAPS capsule Take 2 capsules by mouth daily. 01/25/16   Bettey Costa, MD  albuterol (PROVENTIL HFA;VENTOLIN HFA) 108 (90 Base) MCG/ACT inhaler Inhale 2 puffs into the lungs every 6 (six) hours as needed for wheezing or shortness of breath. 05/15/16   Epifanio Lesches, MD  apixaban (ELIQUIS) 5 MG TABS tablet Take 1 tablet (5 mg total) by mouth 2 (two) times daily. 09/17/16   [name], NP  atorvastatin (LIPITOR) 40 MG tablet Take 1 tablet (40 mg total) by mouth at bedtime. 09/17/16   [name], NP  budesonide-formoterol [facility]) 160-4.5 MCG/ACT inhaler Inhale 2 puffs into the lungs 2 (two) times daily. 05/15/16   Epifanio Lesches, MD  insulin aspart (NOVOLOG FLEXPEN) 100 UNIT/ML FlexPen Inject 5  units into the skin with meals for blood sugars above 130. 09/17/16   [name], NP  Insulin Detemir (LEVEMIR FLEXPEN) 100 UNIT/ML Pen Inject 12 Units into the skin every evening. 09/17/16   [name], NP  levothyroxine (SYNTHROID, LEVOTHROID) 75 MCG tablet Take 1 tablet (75 mcg total) by mouth daily before breakfast. 09/17/16   [name], NP  lisinopril (PRINIVIL,ZESTRIL) 5 MG tablet Take 1 tablet by mouth every morning for kidney protection against diabetes. 09/17/16   [name], NP  metoCLOPramide (REGLAN) 10 MG tablet Take 1 tablet (10 mg total) by mouth every 8 (eight) hours as needed for nausea. 09/17/16   [name], NP  metoprolol tartrate (LOPRESSOR) 25 MG tablet Take 1 tablet (25 mg total) by mouth 2 (two) times daily. 09/17/16   [name], NP  ondansetron (ZOFRAN ODT) 4 MG disintegrating tablet Take 1 tablet (4 mg total) by mouth every 8 (eight) hours as needed for nausea or vomiting. 05/15/16   Epifanio Lesches, MD  pantoprazole (PROTONIX) 40 MG tablet Take 1 tablet (40 mg total) by mouth daily. 09/17/16   [name], NP  tiZANidine (  ZANAFLEX) 4 MG tablet Take 1 tablet (4 mg total) by mouth 3 (three) times daily as needed for muscle spasms. 09/17/16   [name], NP    Allergies Hydrocodone; Aspirin; Erythromycin; Prednisone; Rosiglitazone maleate; Codeine sulfate; and Tetanus-diphtheria toxoids td  Family History  Problem Relation Age of Onset  . Hypertension Mother   . CAD Sister   . Heart attack Sister        Deceased 2014-11-16  . CAD Brother     Social History Social History   Tobacco Use  . Smoking status: Never Smoker  . Smokeless tobacco: Never Used  Substance Use Topics  . Alcohol use: No  . Drug use: No    Review of Systems  Constitutional: Positive for generalized weakness.  No fever/chills. Eyes: No visual changes. ENT: No sore throat. Cardiovascular: Denies chest pain. Respiratory: Denies shortness of  breath. Gastrointestinal: No abdominal pain.  No nausea, no vomiting.  No diarrhea.  No constipation. Genitourinary: Negative for dysuria. Musculoskeletal: Negative for back pain. Skin: Negative for rash. Neurological: Negative for headaches, focal weakness or numbness.  10 point review of systems limited by altered mentation ____________________________________________   PHYSICAL EXAM:  VITAL SIGNS: ED Triage Vitals  Enc Vitals Group     BP 12/14/17 2333 125/72     Pulse Rate 12/14/17 2333 (!) 117     Resp 12/14/17 2333 14     Temp 12/14/17 2333 97.9 F (36.6 C)     Temp Source 12/14/17 2333 Oral     SpO2 12/14/17 2333 96 %     Weight 12/14/17 2334 135 lb (61.2 kg)     Height --      Head Circumference --      Peak Flow --      Pain Score 12/14/17 2334 0     Pain Loc --      Pain Edu? --      Excl. in [place]? --     Constitutional: Alert and oriented. Well appearing and in no acute distress. Eyes: Conjunctivae are normal. PERRL. EOMI. Head: Atraumatic. Nose: No congestion/rhinnorhea. Mouth/Throat: Mucous membranes are moist.  Oropharynx non-erythematous. Neck: No stridor.  Supple neck without meningismus. Cardiovascular: Normal rate, regular rhythm. Grossly normal heart sounds.  Good peripheral circulation. Respiratory: Normal respiratory effort.  No retractions. Lungs CTAB. Gastrointestinal: Soft and nontender. No distention. No abdominal bruits. No CVA tenderness. Musculoskeletal: No lower extremity tenderness nor edema.  No joint effusions. Neurologic:  Normal speech and language. No gross focal neurologic deficits are appreciated. MAEx4. Skin:  Skin is warm, dry and intact. No rash noted.  No petechiae. Psychiatric: Mood and affect are normal. Speech and behavior are normal.  ____________________________________________   LABS (all labs ordered are listed, but only abnormal results are displayed)  Labs Reviewed  GLUCOSE, CAPILLARY - Abnormal; Notable for the  following components:      Result Value   Glucose-Capillary >600 (*)    All other components within normal limits  CBC WITH DIFFERENTIAL/PLATELET - Abnormal; Notable for the following components:   WBC 13.7 (*)    Hemoglobin 11.3 (*)    MCH 23.7 (*)    MCHC 29.0 (*)    RDW 18.4 (*)    Neutro Abs 12.3 (*)    Lymphs Abs 0.6 (*)    All other components within normal limits  COMPREHENSIVE METABOLIC PANEL - Abnormal; Notable for the following components:   Sodium 120 (*)    Potassium 2.4 (*)  Chloride 86 (*)    CO2 21 (*)    Glucose, Bld 984 (*)    Creatinine, Ser 1.86 (*)    Calcium 7.9 (*)    Albumin 2.6 (*)    Alkaline Phosphatase 176 (*)    GFR calc non Af Amer 28 (*)    GFR calc Af Amer 32 (*)    All other components within normal limits  TROPONIN I - Abnormal; Notable for the following components:   Troponin I 0.07 (*)    All other components within normal limits  LACTIC ACID, PLASMA - Abnormal; Notable for the following components:   Lactic Acid, Venous 2.5 (*)    All other components within normal limits  URINALYSIS, COMPLETE (UACMP) WITH MICROSCOPIC - Abnormal; Notable for the following components:   Color, Urine YELLOW (*)    APPearance CLOUDY (*)    Glucose, UA >=500 (*)    Hgb urine dipstick LARGE (*)    Protein, ur 30 (*)    Leukocytes, UA LARGE (*)    Bacteria, UA RARE (*)    All other components within normal limits  AMMONIA - Abnormal; Notable for the following components:   Ammonia 41 (*)    All other components within normal limits  BETA-HYDROXYBUTYRIC ACID - Abnormal; Notable for the following components:   Beta-Hydroxybutyric Acid 0.89 (*)    All other components within normal limits  LACTIC ACID, PLASMA - Abnormal; Notable for the following components:   Lactic Acid, Venous 3.2 (*)    All other components within normal limits  GLUCOSE, CAPILLARY - Abnormal; Notable for the following components:   Glucose-Capillary >600 (*)    All other components  within normal limits  GLUCOSE, CAPILLARY - Abnormal; Notable for the following components:   Glucose-Capillary >600 (*)    All other components within normal limits  GLUCOSE, CAPILLARY - Abnormal; Notable for the following components:   Glucose-Capillary 532 (*)    All other components within normal limits  GLUCOSE, CAPILLARY - Abnormal; Notable for the following components:   Glucose-Capillary 427 (*)    All other components within normal limits  URINE CULTURE  CULTURE, BLOOD (ROUTINE X 2)  CULTURE, BLOOD (ROUTINE X 2)  TSH  HEMOGLOBIN A1C   ____________________________________________  EKG  ED ECG REPORT I, Massimiliano Rohleder J, the attending physician, personally viewed and interpreted this ECG.   Date: 12/15/2017  EKG Time: 2330  Rate: 116  Rhythm: normal EKG, normal sinus rhythm, unchanged from previous tracings, sinus tachycardia  Axis: Within normal limits  Intervals:none  ST&T Change: Nonspecific  ____________________________________________  RADIOLOGY  ED MD interpretation: No acute cardiopulmonary process  Official radiology report(s): Ct Head Wo Contrast  Result Date: 12/15/2017 CLINICAL DATA:  Altered level of consciousness.  Hyperglycemia. EXAM: CT HEAD WITHOUT CONTRAST TECHNIQUE: Contiguous axial images were obtained from the base of the skull through the vertex without intravenous contrast. COMPARISON:  01/12/2016 FINDINGS: Brain: Mild diffuse cerebral atrophy. Low-attenuation changes in the deep white matter consistent small vessel ischemia. No evidence of acute infarction, hemorrhage, hydrocephalus, extra-axial collection or mass lesion/mass effect. Vascular: Intracranial arterial vascular calcifications are present. Skull: Calvarium appears intact. Sinuses/Orbits: Paranasal sinuses and mastoid air cells are clear. Other: None. IMPRESSION: No acute intracranial abnormalities. Mild chronic atrophy and small vessel ischemic changes. Electronically Signed   By: Lucienne Capers M.D.   On: 12/15/2017 00:56   Dg Chest Port 1 View  Result Date: 12/15/2017 CLINICAL DATA:  Altered mental status today. EXAM: PORTABLE CHEST  1 VIEW COMPARISON:  08/06/2016 FINDINGS: Shallow inspiration. The heart size and mediastinal contours are within normal limits. Both lungs are clear. The visualized skeletal structures are unremarkable. IMPRESSION: No active disease. Electronically Signed   By: Lucienne Capers M.D.   On: 12/15/2017 00:20    ____________________________________________   PROCEDURES  Procedure(s) performed: None  Procedures  Critical Care performed: Yes, see critical care note(s)  CRITICAL CARE Performed by: Paulette Blanch   Total critical care time: 30 minutes  Critical care time was exclusive of separately billable procedures and treating other patients.  Critical care was necessary to treat or prevent imminent or life-threatening deterioration.  Critical care was time spent personally by me on the following activities: development of treatment plan with patient and/or surrogate as well as nursing, discussions with consultants, evaluation of patient's response to treatment, examination of patient, obtaining history from patient or surrogate, ordering and performing treatments and interventions, ordering and review of laboratory studies, ordering and review of radiographic studies, pulse oximetry and re-evaluation of patient's condition. ____________________________________________   INITIAL IMPRESSION / ASSESSMENT AND PLAN / ED COURSE  As part of my medical decision making, I reviewed the following data within the [facility] notes reviewed and incorporated, Labs reviewed, EKG interpreted, Old chart reviewed, Radiograph reviewed , Discussed with admitting physician and Notes from prior ED visits   62 year old female who presents with altered mentation. Differential diagnosis includes, but is not limited to, alcohol, illicit or  prescription medications, or other toxic ingestion; intracranial pathology such as stroke or intracerebral hemorrhage; fever or infectious causes including sepsis; hypoxemia and/or hypercarbia; uremia; trauma; endocrine related disorders such as diabetes, hypoglycemia, and thyroid-related diseases; hypertensive encephalopathy; etc.  Blood sugar noted to be over 600.  Given patient's history of alcohol abuse, will check ammonia in addition to blood cultures and lactate.  Anticipate hospitalization.   Clinical Course as of Dec 15 505  Nancy Fetter Dec 15, 2017  0026 Noted urinalysis.  Will initiate IV antibiotics.  Urinalysis, Complete w Microscopic(!) [JS]  0029 Ammonia(!): 41 [JS]  0100 Noted hyperglycemia, hypokalemia, pseudohyponatremia.  Discussed with hospitalist who recommends initiating glucose stabilizer.  Noted lactic acid as well as beta hydroxybutyrate.  Runs of potassium ordered for hypokalemia.   [JS]    Clinical Course User Index [JS] Paulette Blanch, MD     ____________________________________________   FINAL CLINICAL IMPRESSION(S) / ED DIAGNOSES  Final diagnoses:  Lower urinary tract infectious disease  Altered mental status, unspecified altered mental status type  Hyperglycemia  Dehydration  Sepsis, due to unspecified organism [facility])  Hypokalemia  Hyponatremia     ED Discharge Orders    None       Note:  This document was prepared using Dragon voice recognition software and may include unintentional dictation errors.    Paulette Blanch, MD 12/15/17 [phone]

## 2017-12-14 NOTE — ED Triage Notes (Signed)
Arrives to ER via EMS from home. Husband called, stating patient was weak and had "collapse" to floor. Reports patient alert and oriented X4 at baseline, does all of ADL's independently. CBG "HIGH" with EMS. ST on monitor with EMS. Attempt for IV unsuccessful. Disoriented X 4 at this time of arrival, does follow commands. Pt appears confused.

## 2017-12-15 ENCOUNTER — Emergency Department: Payer: Medicare HMO

## 2017-12-15 ENCOUNTER — Other Ambulatory Visit: Payer: Self-pay

## 2017-12-15 ENCOUNTER — Inpatient Hospital Stay: Payer: Medicare HMO

## 2017-12-15 DIAGNOSIS — E876 Hypokalemia: Secondary | ICD-10-CM | POA: Diagnosis present

## 2017-12-15 DIAGNOSIS — I10 Essential (primary) hypertension: Secondary | ICD-10-CM | POA: Diagnosis present

## 2017-12-15 DIAGNOSIS — K3184 Gastroparesis: Secondary | ICD-10-CM | POA: Diagnosis present

## 2017-12-15 DIAGNOSIS — E039 Hypothyroidism, unspecified: Secondary | ICD-10-CM | POA: Diagnosis present

## 2017-12-15 DIAGNOSIS — I480 Paroxysmal atrial fibrillation: Secondary | ICD-10-CM | POA: Diagnosis present

## 2017-12-15 DIAGNOSIS — K219 Gastro-esophageal reflux disease without esophagitis: Secondary | ICD-10-CM | POA: Diagnosis present

## 2017-12-15 DIAGNOSIS — Z9114 Patient's other noncompliance with medication regimen: Secondary | ICD-10-CM | POA: Diagnosis not present

## 2017-12-15 DIAGNOSIS — A419 Sepsis, unspecified organism: Secondary | ICD-10-CM | POA: Diagnosis present

## 2017-12-15 DIAGNOSIS — Z87891 Personal history of nicotine dependence: Secondary | ICD-10-CM | POA: Diagnosis not present

## 2017-12-15 DIAGNOSIS — G894 Chronic pain syndrome: Secondary | ICD-10-CM | POA: Diagnosis present

## 2017-12-15 DIAGNOSIS — E1043 Type 1 diabetes mellitus with diabetic autonomic (poly)neuropathy: Secondary | ICD-10-CM | POA: Diagnosis present

## 2017-12-15 DIAGNOSIS — E1065 Type 1 diabetes mellitus with hyperglycemia: Secondary | ICD-10-CM | POA: Diagnosis present

## 2017-12-15 DIAGNOSIS — N179 Acute kidney failure, unspecified: Secondary | ICD-10-CM | POA: Diagnosis present

## 2017-12-15 DIAGNOSIS — N390 Urinary tract infection, site not specified: Secondary | ICD-10-CM | POA: Diagnosis present

## 2017-12-15 DIAGNOSIS — Z8542 Personal history of malignant neoplasm of other parts of uterus: Secondary | ICD-10-CM | POA: Diagnosis not present

## 2017-12-15 DIAGNOSIS — E785 Hyperlipidemia, unspecified: Secondary | ICD-10-CM | POA: Diagnosis present

## 2017-12-15 DIAGNOSIS — E440 Moderate protein-calorie malnutrition: Secondary | ICD-10-CM | POA: Diagnosis present

## 2017-12-15 DIAGNOSIS — E860 Dehydration: Secondary | ICD-10-CM | POA: Diagnosis present

## 2017-12-15 DIAGNOSIS — G9341 Metabolic encephalopathy: Secondary | ICD-10-CM | POA: Diagnosis present

## 2017-12-15 DIAGNOSIS — Z79899 Other long term (current) drug therapy: Secondary | ICD-10-CM | POA: Diagnosis not present

## 2017-12-15 DIAGNOSIS — Z7901 Long term (current) use of anticoagulants: Secondary | ICD-10-CM | POA: Diagnosis not present

## 2017-12-15 DIAGNOSIS — Z8673 Personal history of transient ischemic attack (TIA), and cerebral infarction without residual deficits: Secondary | ICD-10-CM | POA: Diagnosis not present

## 2017-12-15 DIAGNOSIS — K7460 Unspecified cirrhosis of liver: Secondary | ICD-10-CM | POA: Diagnosis present

## 2017-12-15 DIAGNOSIS — E871 Hypo-osmolality and hyponatremia: Secondary | ICD-10-CM | POA: Diagnosis present

## 2017-12-15 HISTORY — DX: Sepsis, unspecified organism: A41.9

## 2017-12-15 LAB — CBC WITH DIFFERENTIAL/PLATELET
BASOS PCT: 0 %
Basophils Absolute: 0 10*3/uL (ref 0–0.1)
Eosinophils Absolute: 0 10*3/uL (ref 0–0.7)
Eosinophils Relative: 0 %
HCT: 39 % (ref 35.0–47.0)
HEMOGLOBIN: 11.3 g/dL — AB (ref 12.0–16.0)
Lymphocytes Relative: 4 %
Lymphs Abs: 0.6 10*3/uL — ABNORMAL LOW (ref 1.0–3.6)
MCH: 23.7 pg — ABNORMAL LOW (ref 26.0–34.0)
MCHC: 29 g/dL — AB (ref 32.0–36.0)
MCV: 82 fL (ref 80.0–100.0)
Monocytes Absolute: 0.8 10*3/uL (ref 0.2–0.9)
Monocytes Relative: 6 %
NEUTROS PCT: 90 %
Neutro Abs: 12.3 10*3/uL — ABNORMAL HIGH (ref 1.4–6.5)
Platelets: 256 10*3/uL (ref 150–440)
RBC: 4.76 MIL/uL (ref 3.80–5.20)
RDW: 18.4 % — ABNORMAL HIGH (ref 11.5–14.5)
WBC: 13.7 10*3/uL — AB (ref 3.6–11.0)

## 2017-12-15 LAB — URINALYSIS, COMPLETE (UACMP) WITH MICROSCOPIC
Bilirubin Urine: NEGATIVE
Ketones, ur: NEGATIVE mg/dL
Nitrite: NEGATIVE
PH: 6 (ref 5.0–8.0)
Protein, ur: 30 mg/dL — AB
SPECIFIC GRAVITY, URINE: 1.023 (ref 1.005–1.030)
Squamous Epithelial / LPF: NONE SEEN

## 2017-12-15 LAB — BLOOD CULTURE ID PANEL (REFLEXED)
ACINETOBACTER BAUMANNII: NOT DETECTED
CANDIDA ALBICANS: NOT DETECTED
Candida glabrata: NOT DETECTED
Candida krusei: NOT DETECTED
Candida parapsilosis: NOT DETECTED
Candida tropicalis: NOT DETECTED
ENTEROBACTERIACEAE SPECIES: NOT DETECTED
ENTEROCOCCUS SPECIES: NOT DETECTED
ESCHERICHIA COLI: NOT DETECTED
Enterobacter cloacae complex: NOT DETECTED
HAEMOPHILUS INFLUENZAE: NOT DETECTED
Klebsiella oxytoca: NOT DETECTED
Klebsiella pneumoniae: NOT DETECTED
LISTERIA MONOCYTOGENES: NOT DETECTED
METHICILLIN RESISTANCE: NOT DETECTED
Neisseria meningitidis: NOT DETECTED
PSEUDOMONAS AERUGINOSA: NOT DETECTED
Proteus species: NOT DETECTED
SERRATIA MARCESCENS: NOT DETECTED
STAPHYLOCOCCUS AUREUS BCID: NOT DETECTED
STREPTOCOCCUS AGALACTIAE: NOT DETECTED
STREPTOCOCCUS PNEUMONIAE: NOT DETECTED
STREPTOCOCCUS PYOGENES: NOT DETECTED
Staphylococcus species: DETECTED — AB
Streptococcus species: NOT DETECTED

## 2017-12-15 LAB — COMPREHENSIVE METABOLIC PANEL
ALBUMIN: 2.6 g/dL — AB (ref 3.5–5.0)
ALK PHOS: 176 U/L — AB (ref 38–126)
ALT: 26 U/L (ref 14–54)
ANION GAP: 13 (ref 5–15)
AST: 30 U/L (ref 15–41)
BUN: 14 mg/dL (ref 6–20)
CALCIUM: 7.9 mg/dL — AB (ref 8.9–10.3)
CHLORIDE: 86 mmol/L — AB (ref 101–111)
CO2: 21 mmol/L — AB (ref 22–32)
Creatinine, Ser: 1.86 mg/dL — ABNORMAL HIGH (ref 0.44–1.00)
GFR calc Af Amer: 32 mL/min — ABNORMAL LOW (ref >60)
GFR calc non Af Amer: 28 mL/min — ABNORMAL LOW (ref >60)
GLUCOSE: 984 mg/dL — AB (ref 65–99)
Potassium: 2.4 mmol/L — CL (ref 3.5–5.1)
Sodium: 120 mmol/L — ABNORMAL LOW (ref 135–145)
Total Bilirubin: 1 mg/dL (ref 0.3–1.2)
Total Protein: 7.1 g/dL (ref 6.5–8.1)

## 2017-12-15 LAB — MAGNESIUM: MAGNESIUM: 1.7 mg/dL (ref 1.7–2.4)

## 2017-12-15 LAB — GLUCOSE, CAPILLARY
Glucose-Capillary: >600 mg/dL (ref 65–99)
Glucose-Capillary: >600 mg/dL (ref 65–99)
Glucose-Capillary: 532 mg/dL (ref 65–99)
Glucose-Capillary: 427 mg/dL — ABNORMAL HIGH (ref 65–99)
Glucose-Capillary: 354 mg/dL — ABNORMAL HIGH (ref 65–99)
Glucose-Capillary: 356 mg/dL — ABNORMAL HIGH (ref 65–99)
Glucose-Capillary: 264 mg/dL — ABNORMAL HIGH (ref 65–99)
Glucose-Capillary: 117 mg/dL — ABNORMAL HIGH (ref 65–99)
Glucose-Capillary: 250 mg/dL — ABNORMAL HIGH (ref 65–99)

## 2017-12-15 LAB — TROPONIN I: Troponin I: 0.07 ng/mL (ref <0.03)

## 2017-12-15 LAB — LACTIC ACID, PLASMA
LACTIC ACID, VENOUS: 3.2 mmol/L — AB (ref 0.5–1.9)
Lactic Acid, Venous: 2.5 mmol/L (ref 0.5–1.9)

## 2017-12-15 LAB — BASIC METABOLIC PANEL
Anion gap: 9 (ref 5–15)
BUN: 12 mg/dL (ref 6–20)
CHLORIDE: 101 mmol/L (ref 101–111)
CO2: 21 mmol/L — AB (ref 22–32)
CREATININE: 1.29 mg/dL — AB (ref 0.44–1.00)
Calcium: 7.3 mg/dL — ABNORMAL LOW (ref 8.9–10.3)
GFR calc Af Amer: 50 mL/min — ABNORMAL LOW (ref >60)
GFR calc non Af Amer: 43 mL/min — ABNORMAL LOW (ref >60)
GLUCOSE: 395 mg/dL — AB (ref 65–99)
Potassium: 2.8 mmol/L — ABNORMAL LOW (ref 3.5–5.1)
Sodium: 131 mmol/L — ABNORMAL LOW (ref 135–145)

## 2017-12-15 LAB — AMMONIA: Ammonia: 41 umol/L — ABNORMAL HIGH (ref 9–35)

## 2017-12-15 LAB — TSH: TSH: 1.332 u[IU]/mL (ref 0.350–4.500)

## 2017-12-15 LAB — BETA-HYDROXYBUTYRIC ACID: BETA-HYDROXYBUTYRIC ACID: 0.89 mmol/L — AB (ref 0.05–0.27)

## 2017-12-15 IMAGING — DX DG ABDOMEN 1V
1 series · 1 of 1 positions shown · non-contrast
Comparison: Prior film same day

CLINICAL DATA: NG readjustment

EXAM:
ABDOMEN - 1 VIEW

[abdomen kub]
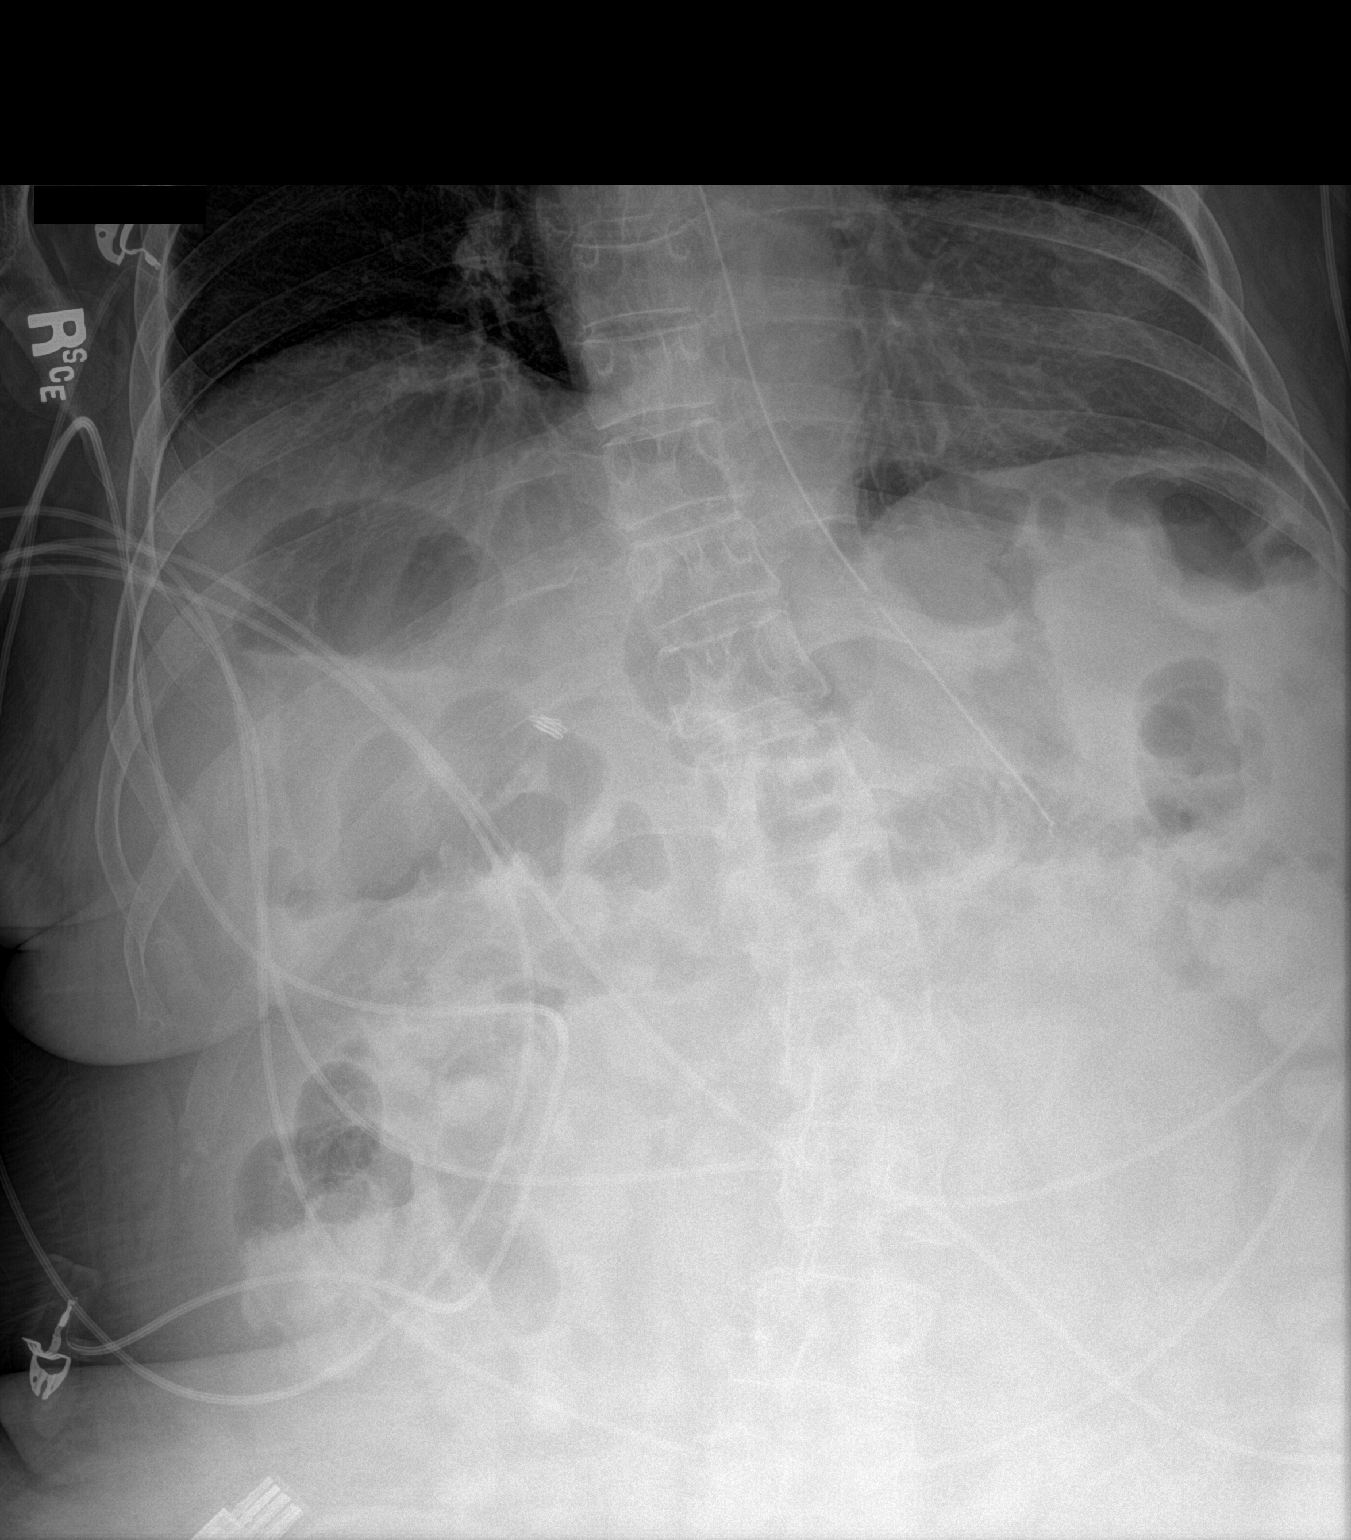

[1 of 1 positions shown; findings below may reference images not displayed]

FINDINGS: There is normal small bowel gas pattern. Postcholecystectomy
surgical clips are noted. NG tube with tip in mid stomach.
IMPRESSION: NG tube in place.  Normal small bowel gas pattern.

## 2017-12-15 IMAGING — DX DG ABDOMEN 1V
1 series · 1 of 1 positions shown · non-contrast
Comparison: 01/20/2013

CLINICAL DATA: NG tube placement

EXAM:
ABDOMEN - 1 VIEW

[abdomen kub]
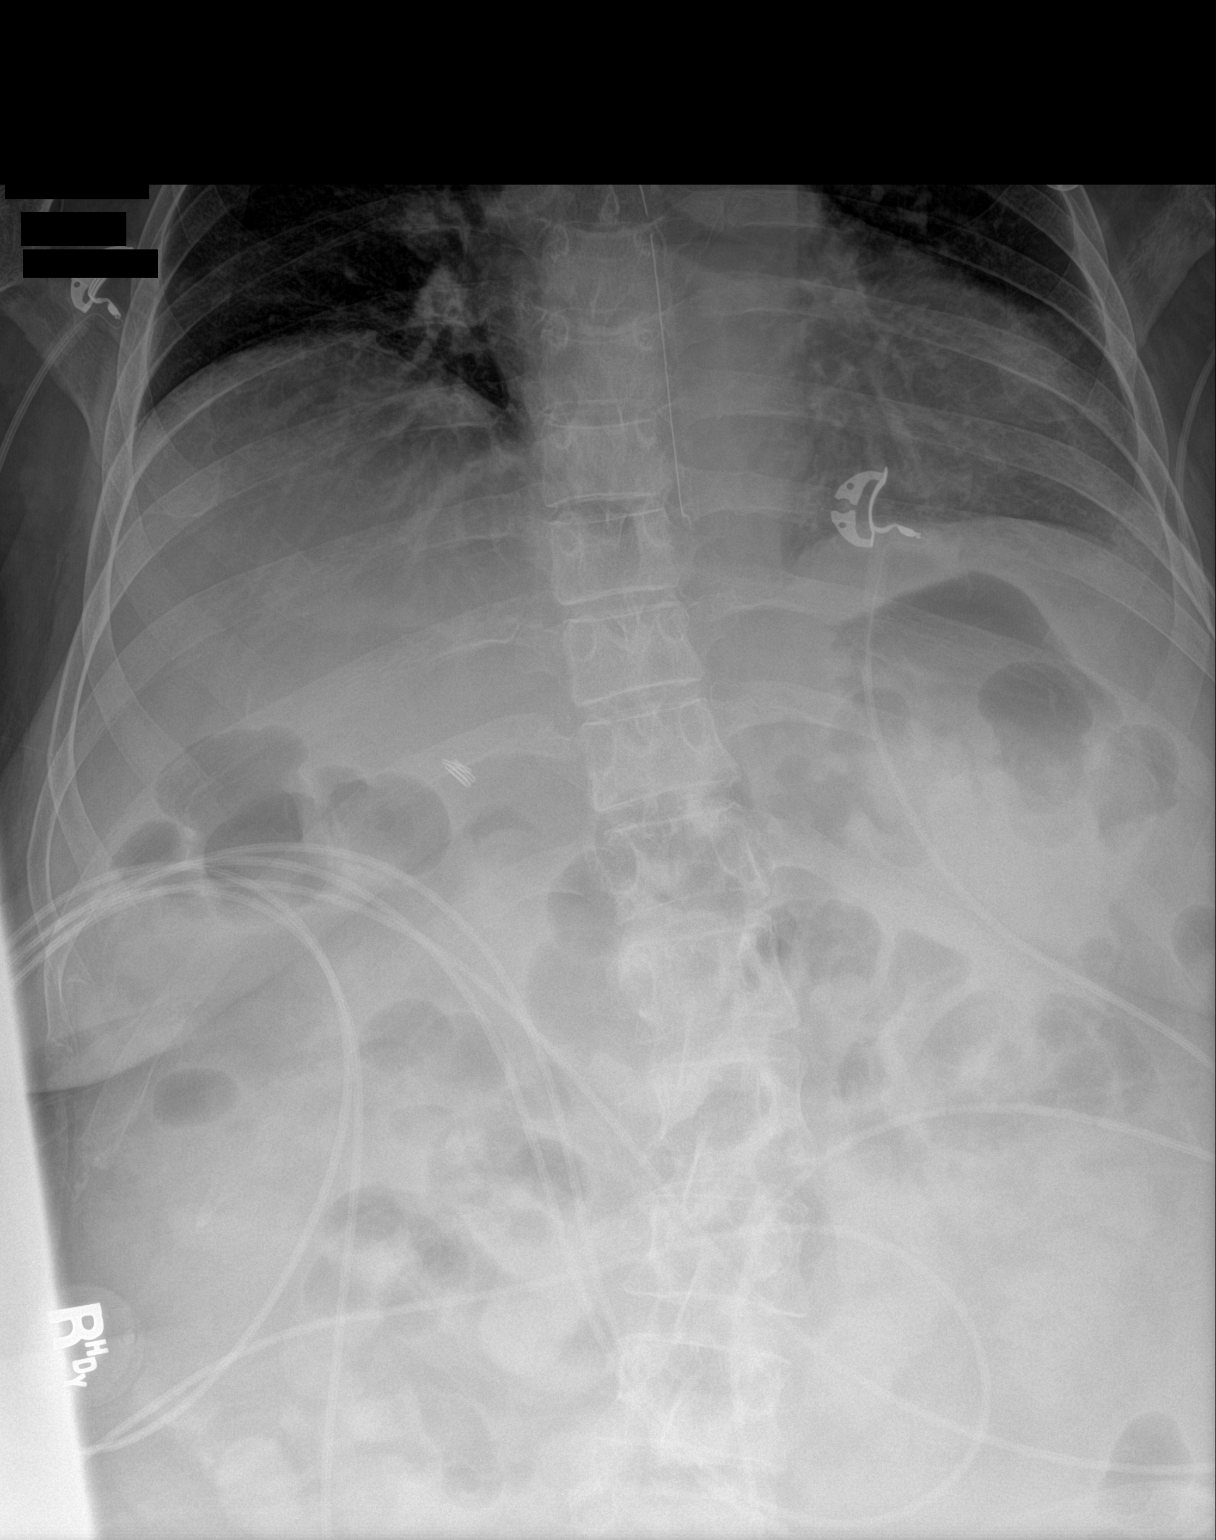

[1 of 1 positions shown; findings below may reference images not displayed]

FINDINGS: There is NG tube with tip in distal esophagus. Advancement of the NG
tube into the stomach is recommended. Mild gaseous distended small
bowel loops mid abdomen suspicious for mild ileus with slight
improvement from prior exam. Some colonic gas and stool noted in
transverse colon.
IMPRESSION: NG tube with tip in distal esophagus. Advancement of the NG tube
into the stomach is recommended. Mild gaseous distended small bowel
loops in mid abdomen probable mild ileus with improvement from prior
exam.

## 2017-12-15 MED ORDER — ALBUTEROL SULFATE (2.5 MG/3ML) 0.083% IN NEBU: 2.5000 mg | INHALATION_SOLUTION | RESPIRATORY_TRACT | Status: DC | PRN

## 2017-12-15 MED ORDER — INSULIN ASPART 100 UNIT/ML ~~LOC~~ SOLN
0.0000 [IU] | Freq: Every day | SUBCUTANEOUS | Status: DC
  Administered 2017-12-15: 2 [IU] via SUBCUTANEOUS
  Filled 2017-12-15: qty 1

## 2017-12-15 MED ORDER — POTASSIUM CHLORIDE 20 MEQ PO PACK
20.0000 meq | PACK | Freq: Two times a day (BID) | ORAL | Status: DC
  Administered 2017-12-15 – 2017-12-17 (×5): 20 meq via ORAL
  Filled 2017-12-15 (×5): qty 1

## 2017-12-15 MED ORDER — ATORVASTATIN CALCIUM 20 MG PO TABS
40.0000 mg | ORAL_TABLET | Freq: Every day | ORAL | Status: DC
  Administered 2017-12-15 – 2017-12-16 (×2): 40 mg via ORAL
  Filled 2017-12-15 (×2): qty 2

## 2017-12-15 MED ORDER — SODIUM CHLORIDE 0.9 % IV BOLUS (SEPSIS)
1000.0000 mL | Freq: Once | INTRAVENOUS | Status: AC
  Administered 2017-12-15: 1000 mL via INTRAVENOUS

## 2017-12-15 MED ORDER — ONDANSETRON HCL 4 MG/2ML IJ SOLN: 4.0000 mg | Freq: Four times a day (QID) | INTRAMUSCULAR | Status: DC | PRN

## 2017-12-15 MED ORDER — OXYCODONE HCL ER 10 MG PO T12A
10.0000 mg | EXTENDED_RELEASE_TABLET | Freq: Three times a day (TID) | ORAL | Status: DC
  Administered 2017-12-15 – 2017-12-17 (×7): 10 mg via ORAL
  Filled 2017-12-15 (×7): qty 1

## 2017-12-15 MED ORDER — POTASSIUM CHLORIDE 10 MEQ/100ML IV SOLN
10.0000 meq | Freq: Once | INTRAVENOUS | Status: DC
  Filled 2017-12-15: qty 100

## 2017-12-15 MED ORDER — POTASSIUM CHLORIDE 10 MEQ/100ML IV SOLN
10.0000 meq | Freq: Once | INTRAVENOUS | Status: AC
  Administered 2017-12-15: 10 meq via INTRAVENOUS

## 2017-12-15 MED ORDER — RISAQUAD PO CAPS
2.0000 | ORAL_CAPSULE | Freq: Every day | ORAL | Status: DC
  Administered 2017-12-15 – 2017-12-17 (×3): 2 via ORAL
  Filled 2017-12-15 (×3): qty 2

## 2017-12-15 MED ORDER — METOPROLOL TARTRATE 25 MG PO TABS
25.0000 mg | ORAL_TABLET | Freq: Two times a day (BID) | ORAL | Status: DC
  Administered 2017-12-15 – 2017-12-17 (×4): 25 mg via ORAL
  Filled 2017-12-15 (×4): qty 1

## 2017-12-15 MED ORDER — LEVOTHYROXINE SODIUM 50 MCG PO TABS
75.0000 ug | ORAL_TABLET | Freq: Every day | ORAL | Status: DC
  Administered 2017-12-15 – 2017-12-17 (×3): 75 ug via ORAL
  Filled 2017-12-15 (×3): qty 1

## 2017-12-15 MED ORDER — ONDANSETRON HCL 4 MG PO TABS: 4.0000 mg | ORAL_TABLET | Freq: Four times a day (QID) | ORAL | Status: DC | PRN

## 2017-12-15 MED ORDER — INSULIN ASPART 100 UNIT/ML ~~LOC~~ SOLN
0.0000 [IU] | Freq: Three times a day (TID) | SUBCUTANEOUS | Status: DC
  Administered 2017-12-15 – 2017-12-16 (×2): 11 [IU] via SUBCUTANEOUS
  Administered 2017-12-16: 4 [IU] via SUBCUTANEOUS
  Administered 2017-12-16: 11 [IU] via SUBCUTANEOUS
  Administered 2017-12-17: 4 [IU] via SUBCUTANEOUS
  Administered 2017-12-17: 7 [IU] via SUBCUTANEOUS
  Filled 2017-12-15 (×6): qty 1

## 2017-12-15 MED ORDER — INSULIN GLARGINE 100 UNIT/ML ~~LOC~~ SOLN: 18.0000 [IU] | Freq: Every day | SUBCUTANEOUS | Status: DC

## 2017-12-15 MED ORDER — POTASSIUM CHLORIDE IN NACL 20-0.9 MEQ/L-% IV SOLN
INTRAVENOUS | Status: DC
  Administered 2017-12-15 – 2017-12-17 (×4): via INTRAVENOUS
  Filled 2017-12-15 (×6): qty 1000

## 2017-12-15 MED ORDER — INSULIN GLARGINE 100 UNIT/ML ~~LOC~~ SOLN
18.0000 [IU] | Freq: Every day | SUBCUTANEOUS | Status: DC
  Administered 2017-12-15: 18 [IU] via SUBCUTANEOUS
  Filled 2017-12-15 (×2): qty 0.18

## 2017-12-15 MED ORDER — INSULIN ASPART 100 UNIT/ML ~~LOC~~ SOLN
12.0000 [IU] | Freq: Once | SUBCUTANEOUS | Status: AC
  Administered 2017-12-15: 12 [IU] via SUBCUTANEOUS
  Filled 2017-12-15: qty 1

## 2017-12-15 MED ORDER — SODIUM CHLORIDE 0.9 % IV SOLN
INTRAVENOUS | Status: DC
  Administered 2017-12-15: 5.4 [IU]/h via INTRAVENOUS
  Filled 2017-12-15: qty 1

## 2017-12-15 MED ORDER — PANTOPRAZOLE SODIUM 40 MG PO TBEC
40.0000 mg | DELAYED_RELEASE_TABLET | Freq: Every day | ORAL | Status: DC
  Administered 2017-12-15 – 2017-12-17 (×3): 40 mg via ORAL
  Filled 2017-12-15 (×3): qty 1

## 2017-12-15 MED ORDER — APIXABAN 5 MG PO TABS
5.0000 mg | ORAL_TABLET | Freq: Two times a day (BID) | ORAL | Status: DC
  Administered 2017-12-15 – 2017-12-17 (×5): 5 mg via ORAL
  Filled 2017-12-15 (×5): qty 1

## 2017-12-15 MED ORDER — OXYCODONE ER 9 MG PO C12A: 9.0000 mg | EXTENDED_RELEASE_CAPSULE | Freq: Three times a day (TID) | ORAL | Status: DC

## 2017-12-15 MED ORDER — DOCUSATE SODIUM 100 MG PO CAPS
100.0000 mg | ORAL_CAPSULE | Freq: Two times a day (BID) | ORAL | Status: DC
  Administered 2017-12-15 – 2017-12-17 (×5): 100 mg via ORAL
  Filled 2017-12-15 (×5): qty 1

## 2017-12-15 MED ORDER — ACETAMINOPHEN 650 MG RE SUPP: 650.0000 mg | Freq: Four times a day (QID) | RECTAL | Status: DC | PRN

## 2017-12-15 MED ORDER — POTASSIUM CHLORIDE 10 MEQ/100ML IV SOLN
10.0000 meq | Freq: Once | INTRAVENOUS | Status: AC
  Administered 2017-12-15: 10 meq via INTRAVENOUS
  Filled 2017-12-15: qty 100

## 2017-12-15 MED ORDER — SODIUM CHLORIDE 0.9 % IV SOLN
2.0000 g | Freq: Once | INTRAVENOUS | Status: AC
  Administered 2017-12-15: 2 g via INTRAVENOUS
  Filled 2017-12-15: qty 20

## 2017-12-15 MED ORDER — MAGNESIUM SULFATE 2 GM/50ML IV SOLN
2.0000 g | Freq: Once | INTRAVENOUS | Status: AC
  Administered 2017-12-15: 2 g via INTRAVENOUS
  Filled 2017-12-15: qty 50

## 2017-12-15 MED ORDER — SODIUM CHLORIDE 0.9 % IV SOLN
2.0000 g | INTRAVENOUS | Status: DC
  Administered 2017-12-16 – 2017-12-17 (×2): 2 g via INTRAVENOUS
  Filled 2017-12-15 (×3): qty 20

## 2017-12-15 MED ORDER — METOCLOPRAMIDE HCL 10 MG PO TABS: 10.0000 mg | ORAL_TABLET | Freq: Three times a day (TID) | ORAL | Status: DC | PRN

## 2017-12-15 MED ORDER — ACETAMINOPHEN 325 MG PO TABS
650.0000 mg | ORAL_TABLET | Freq: Four times a day (QID) | ORAL | Status: DC | PRN
  Administered 2017-12-15 – 2017-12-16 (×3): 650 mg via ORAL
  Filled 2017-12-15 (×3): qty 2

## 2017-12-15 MED ORDER — SODIUM CHLORIDE 0.9 % IV SOLN
INTRAVENOUS | Status: DC
  Administered 2017-12-15 (×2): via INTRAVENOUS

## 2017-12-15 NOTE — ED Notes (Signed)
Fall risk band, sign and bed alarm on patient.

## 2017-12-15 NOTE — ED Notes (Signed)
Pt now oriented to name and DOB. Continues to be disoriented to time, place and situation.

## 2017-12-15 NOTE — ED Notes (Signed)
CBG 532

## 2017-12-15 NOTE — Progress Notes (Signed)
PHARMACY - PHYSICIAN COMMUNICATION CRITICAL VALUE ALERT - BLOOD CULTURE IDENTIFICATION (BCID)  Results for orders placed or performed during the hospital encounter of 12/14/17  Blood Culture ID Panel (Reflexed) (Collected: 12/15/2017 12:25 AM)  Result Value Ref Range   Enterococcus species NOT DETECTED NOT DETECTED   Listeria monocytogenes NOT DETECTED NOT DETECTED   Staphylococcus species DETECTED (A) NOT DETECTED   Staphylococcus aureus NOT DETECTED NOT DETECTED   Methicillin resistance NOT DETECTED NOT DETECTED   Streptococcus species NOT DETECTED NOT DETECTED   Streptococcus agalactiae NOT DETECTED NOT DETECTED   Streptococcus pneumoniae NOT DETECTED NOT DETECTED   Streptococcus pyogenes NOT DETECTED NOT DETECTED   Acinetobacter baumannii NOT DETECTED NOT DETECTED   Enterobacteriaceae species NOT DETECTED NOT DETECTED   Enterobacter cloacae complex NOT DETECTED NOT DETECTED   Escherichia coli NOT DETECTED NOT DETECTED   Klebsiella oxytoca NOT DETECTED NOT DETECTED   Klebsiella pneumoniae NOT DETECTED NOT DETECTED   Proteus species NOT DETECTED NOT DETECTED   Serratia marcescens NOT DETECTED NOT DETECTED   Haemophilus influenzae NOT DETECTED NOT DETECTED   Neisseria meningitidis NOT DETECTED NOT DETECTED   Pseudomonas aeruginosa NOT DETECTED NOT DETECTED   Candida albicans NOT DETECTED NOT DETECTED   Candida glabrata NOT DETECTED NOT DETECTED   Candida krusei NOT DETECTED NOT DETECTED   Candida parapsilosis NOT DETECTED NOT DETECTED   Candida tropicalis NOT DETECTED NOT DETECTED    Name of physician (or Provider) Contacted: Gouru  Changes to prescribed antibiotics required: none  Napoleon Form 12/15/2017  9:57 PM

## 2017-12-15 NOTE — ED Notes (Signed)
Called pharmacy for more potassium.

## 2017-12-15 NOTE — ED Notes (Signed)
Report to [place], [place]

## 2017-12-15 NOTE — Progress Notes (Signed)
[place] [facility] [place] NAME: [name]    MR#:  [id number]  DATE OF BIRTH:  1956-04-27  SUBJECTIVE:   Patient here due to altered mental status/weakness and noted to be severely hyperglycemic with acute kidney injury.  Patient also noted to have a urinary tract infection.  Presently complaining of back pain due to her recent history of recurrent falls.  REVIEW OF SYSTEMS:    Review of Systems  Constitutional: Negative for chills and fever.  HENT: Negative for congestion and tinnitus.   Eyes: Negative for blurred vision and double vision.  Respiratory: Negative for cough, shortness of breath and wheezing.   Cardiovascular: Negative for chest pain, orthopnea and PND.  Gastrointestinal: Negative for abdominal pain, diarrhea, nausea and vomiting.  Genitourinary: Negative for dysuria and hematuria.  Musculoskeletal: Positive for back pain.  Neurological: Negative for dizziness, sensory change and focal weakness.  All other systems reviewed and are negative.   Nutrition: heart Healthy/Carb control Tolerating Diet: Yes Tolerating PT: Await Eval.   DRUG ALLERGIES:   Allergies  Allergen Reactions  . Hydrocodone Other (See Comments)    Pt states that this medication caused cirrhosis of the liver.    . Aspirin   . Erythromycin Other (See Comments)    Reaction:  Fever   . Prednisone Other (See Comments)    Reaction:  Unknown   . Rosiglitazone Maleate Swelling  . Codeine Sulfate Rash  . Tetanus-Diphtheria Toxoids Td Rash and Other (See Comments)    Reaction:  Fever     VITALS:  Blood pressure 126/62, pulse (!) 101, temperature 97.8 F (36.6 C), temperature source Oral, resp. rate 18, height 5' 3"  (1.6 m), weight 61.2 kg (135 lb), SpO2 100 %.  PHYSICAL EXAMINATION:   Physical Exam  GENERAL:  62 y.o.-year-old patient lying in bed Lethargic complaining of back pain but in NAD.  EYES: Pupils equal, round, reactive to light and  accommodation. No scleral icterus. Extraocular muscles intact.  HEENT: Head atraumatic, normocephalic. Oropharynx and nasopharynx clear.  NECK:  Supple, no jugular venous distention. No thyroid enlargement, no tenderness.  LUNGS: Normal breath sounds bilaterally, no wheezing, rales, rhonchi. No use of accessory muscles of respiration.  CARDIOVASCULAR: S1, S2 normal. No murmurs, rubs, or gallops.  ABDOMEN: Soft, nontender, nondistended. Bowel sounds present. No organomegaly or mass.  EXTREMITIES: No cyanosis, clubbing or edema b/l.    NEUROLOGIC: Cranial nerves II through XII are intact. No focal Motor or sensory deficits b/l. Globally weak.  PSYCHIATRIC: The patient is alert and oriented x 3.  SKIN: No obvious rash, lesion, or ulcer.    LABORATORY PANEL:   CBC Recent Labs  Lab 12/14/17 2343  WBC 13.7*  HGB 11.3*  HCT 39.0  PLT 256   ------------------------------------------------------------------------------------------------------------------  Chemistries  Recent Labs  Lab 12/14/17 2343 12/15/17 0952  NA 120* 131*  K 2.4* 2.8*  CL 86* 101  CO2 21* 21*  GLUCOSE 984* 395*  BUN 14 12  CREATININE 1.86* 1.29*  CALCIUM 7.9* 7.3*  MG  --  1.7  AST 30  --   ALT 26  --   ALKPHOS 176*  --   BILITOT 1.0  --    ------------------------------------------------------------------------------------------------------------------  Cardiac Enzymes Recent Labs  Lab 12/14/17 2343  TROPONINI 0.07*   ------------------------------------------------------------------------------------------------------------------  RADIOLOGY:  Dg Thoracic Spine 2 View  Result Date: 12/15/2017 CLINICAL DATA:  Multiple recent falls.  Pain. EXAM: THORACIC SPINE 2 VIEWS COMPARISON:  Chest radiograph, 07/09/2016  FINDINGS: No fracture.  No spondylolisthesis. Mild dextroscoliosis, apex at T8. No bone lesion.  No degenerative changes. Soft tissues are unremarkable. IMPRESSION: 1. No fracture or acute  finding. 2. Mild dextroscoliosis.  No other abnormality. Electronically Signed   By: Lajean Manes M.D.   On: 12/15/2017 13:57   Dg Lumbar Spine 2-3 Views  Result Date: 12/15/2017 CLINICAL DATA:  Multiple recent falls.  Pain. EXAM: LUMBAR SPINE - 2-3 VIEW COMPARISON:  CT, 08/06/2016. FINDINGS: Mild levoscoliosis, apex at L2. No fracture. No spondylolisthesis. Mild loss of disc height at L5-S1. No other degenerative change. Scattered aortic vascular calcifications. Right ureteral stent appears stable from the prior CT. IMPRESSION: 1. No fracture or acute finding. 2. Levoscoliosis and mild L5-S1 disc degenerative changes. Electronically Signed   By: Lajean Manes M.D.   On: 12/15/2017 13:56   Dg Pelvis 1-2 Views  Result Date: 12/15/2017 CLINICAL DATA:  Multiple recent falls.  Pain. EXAM: PELVIS - 1-2 VIEW COMPARISON:  CT, 08/06/2016 FINDINGS: No fracture.  No bone lesion. The hip joints, SI joints and symphysis pubis are normally spaced and aligned. Right mid to distal ureteral stent. Soft tissues otherwise unremarkable. IMPRESSION: 1. No fracture, bone lesion or joint abnormality. Electronically Signed   By: Lajean Manes M.D.   On: 12/15/2017 13:54   Ct Head Wo Contrast  Result Date: 12/15/2017 CLINICAL DATA:  Altered level of consciousness.  Hyperglycemia. EXAM: CT HEAD WITHOUT CONTRAST TECHNIQUE: Contiguous axial images were obtained from the base of the skull through the vertex without intravenous contrast. COMPARISON:  01/12/2016 FINDINGS: Brain: Mild diffuse cerebral atrophy. Low-attenuation changes in the deep white matter consistent small vessel ischemia. No evidence of acute infarction, hemorrhage, hydrocephalus, extra-axial collection or mass lesion/mass effect. Vascular: Intracranial arterial vascular calcifications are present. Skull: Calvarium appears intact. Sinuses/Orbits: Paranasal sinuses and mastoid air cells are clear. Other: None. IMPRESSION: No acute intracranial abnormalities. Mild  chronic atrophy and small vessel ischemic changes. Electronically Signed   By: Lucienne Capers M.D.   On: 12/15/2017 00:56   Dg Chest Port 1 View  Result Date: 12/15/2017 CLINICAL DATA:  Altered mental status today. EXAM: PORTABLE CHEST 1 VIEW COMPARISON:  08/06/2016 FINDINGS: Shallow inspiration. The heart size and mediastinal contours are within normal limits. Both lungs are clear. The visualized skeletal structures are unremarkable. IMPRESSION: No active disease. Electronically Signed   By: Lucienne Capers M.D.   On: 12/15/2017 00:20     ASSESSMENT AND PLAN:   62 year old female with past medical history of hypertension, hypothyroidism, GERD, history of gastroparesis, alcoholic liver cirrhosis, anxiety, chronic pain, history of pancreatitis, previous history of CVA who presents to the hospital due to weakness, altered mental status and noted to have acute kidney injury with severe hyperglycemia noted to have a urinary tract infection.  1.  Altered mental status-metabolic encephalopathy secondary to acute kidney injury/UTI and dehydration. -Continue IV ceftriaxone for the UTI, continue IV fluids, follow mental status.  2.  Urinary tract infection- continue IV ceftriaxone, follow urine cultures.  3.  Diabetes type 2 uncontrolled with hyperglycemia-patient presented to the hospital blood sugars greater than 900.  No evidence of DKA.  Continue IV fluids, continue Lantus, NovoLog sliding scale and blood sugars have improved.  4.  Hypokalemia-continue replace orally and intravenously.  Mag level was low and will give some IV magnesium today. -Repeat level in the morning.  5.  Back pain-patient could not complaining of lower back pain.  X-ray of her thoracic and lumbar spine are negative  for acute pathology.  Continue supportive care for now.  Patient is on chronic OxyContin will continue.  6. Hypothyroidism - cont. Synthroid.   7. Hyperlipidemia - cont. Atorvastatin.    All the records are  reviewed and case discussed with Care Management/Social Worker. Management plans discussed with the patient, family and they are in agreement.  CODE STATUS: Full code  DVT Prophylaxis: Eliquis  TOTAL TIME TAKING CARE OF THIS PATIENT: 30 minutes.   POSSIBLE D/C IN 2-3 DAYS, DEPENDING ON CLINICAL CONDITION.   Henreitta Leber M.D on 12/15/2017 at 2:22 PM  Between 7am to 6pm - Pager - [phone]  After 6pm go to [email or web address] - Proofreader  [facility]  Office  [phone]  CC: Primary care physician; [name], NP

## 2017-12-15 NOTE — ED Notes (Addendum)
CBG >600 HIGH

## 2017-12-15 NOTE — H&P (Signed)
[name] is an 62 y.o. female.   Chief Complaint: Fall HPI: The patient with past medical history of uterine cancer, alcoholic cirrhosis of the liver, hypothyroidism, atrial fibrillation and diabetes presents to the emergency department after a fall.  The patient's husband called EMS due to his inability to stand and her sugar was found to be too high to read.  Blood glucose was found to be greater than 900 in the emergency department.  The patient did not have an anion gap.  She was placed on insulin drip which gradually improved her blood sugars but the patient remained weak and confused which prompted the emergency department staff to call the hospitalist service for admission.  Past Medical History:  Diagnosis Date  . Allergy   . Anxiety   . Cancer (HCC)    HX OF CANCER OF UTERUS   . Cirrhosis of liver not due to alcohol ([place]) Nov 09, 2014  . Degenerative disk disease   . Diverticulitis   . Gastroparesis   . GERD (gastroesophageal reflux disease)   . History of hiatal hernia   . Hypertension   . Hypothyroid   . Intussusception intestine ([place]) 05/2015  . PAF (paroxysmal atrial fibrillation) ([place]) 03/2015   a. new onset 03/2015 in setting of intractable N/V; b. on Eliquis 5 mg bid; c. CHADSVASc 4 (DM, TIA x 2, female)  . Pancreatitis   . Sick sinus syndrome ([place])   . Stomach ulcer   . Stroke [facility])    with minimal left sided weakness  . Syncope 01/2015  . TIA (transient ischemic attack) 02/2015  . Type 1 diabetes ([place])    on levemir    Past Surgical History:  Procedure Laterality Date  . ABDOMINAL HYSTERECTOMY    . CARDIAC CATHETERIZATION N/A 01/12/2016   Procedure: Left Heart Cath and Coronary Angiography;  Surgeon: Wellington Hampshire, MD;  Location: [facility] LAB;  Service: Cardiovascular;  Laterality: N/A;  . CHOLECYSTECTOMY    . CYSTOSCOPY/URETEROSCOPY/HOLMIUM LASER Right 07/14/2016   Procedure: CYSTOSCOPY/URETEROSCOPY/HOLMIUM LASER;  Surgeon: Alexis Frock, MD;  Location:  [facility] ORS;  Service: Urology;  Laterality: Right;  . ESOPHAGOGASTRODUODENOSCOPY N/A 04/04/2015   Procedure: ESOPHAGOGASTRODUODENOSCOPY (EGD);  Surgeon: Hulen Luster, MD;  Location: [facility] ENDOSCOPY;  Service: Endoscopy;  Laterality: N/A;  . ESOPHAGOGASTRODUODENOSCOPY (EGD) WITH PROPOFOL N/A 01/18/2016   Procedure: ESOPHAGOGASTRODUODENOSCOPY (EGD) WITH PROPOFOL;  Surgeon: Lucilla Lame, MD;  Location: [facility] ENDOSCOPY;  Service: Endoscopy;  Laterality: N/A;  . FLEXIBLE SIGMOIDOSCOPY N/A 01/18/2016   Procedure: FLEXIBLE SIGMOIDOSCOPY;  Surgeon: Lucilla Lame, MD;  Location: [facility] ENDOSCOPY;  Service: Endoscopy;  Laterality: N/A;  . HERNIA REPAIR      Family History  Problem Relation Age of Onset  . Hypertension Mother   . CAD Sister   . Heart attack Sister        Deceased 11-09-2014  . CAD Brother    Social History:  reports that she has quit smoking. Her smoking use included cigarettes. She has never used smokeless tobacco. She reports that she does not drink alcohol or use drugs.  Allergies:  Allergies  Allergen Reactions  . Hydrocodone Other (See Comments)    Pt states that this medication caused cirrhosis of the liver.    . Aspirin   . Erythromycin Other (See Comments)    Reaction:  Fever   . Prednisone Other (See Comments)    Reaction:  Unknown   . Rosiglitazone Maleate Swelling  . Codeine Sulfate Rash  . Tetanus-Diphtheria Toxoids Td Rash  and Other (See Comments)    Reaction:  Fever     Medications Prior to Admission  Medication Sig Dispense Refill  . cyclobenzaprine (FLEXERIL) 10 MG tablet Take 10 mg by mouth every 12 (twelve) hours as needed for muscle spasms.     Marland Kitchen XTAMPZA ER 9 MG C12A Take 9 mg by mouth every 8 (eight) hours.    Marland Kitchen acidophilus (RISAQUAD) CAPS capsule Take 2 capsules by mouth daily. 60 capsule 0  . albuterol (PROVENTIL HFA;VENTOLIN HFA) 108 (90 Base) MCG/ACT inhaler Inhale 2 puffs into the lungs every 6 (six) hours as needed for wheezing or shortness of breath. 1 Inhaler 2   . apixaban (ELIQUIS) 5 MG TABS tablet Take 1 tablet (5 mg total) by mouth 2 (two) times daily. 180 tablet 2  . atorvastatin (LIPITOR) 40 MG tablet Take 1 tablet (40 mg total) by mouth at bedtime. 90 tablet 3  . budesonide-formoterol (SYMBICORT) 160-4.5 MCG/ACT inhaler Inhale 2 puffs into the lungs 2 (two) times daily. 1 Inhaler 1  . insulin aspart (NOVOLOG FLEXPEN) 100 UNIT/ML FlexPen Inject 5 units into the skin with meals for blood sugars above 130. 15 mL 11  . Insulin Detemir (LEVEMIR FLEXPEN) 100 UNIT/ML Pen Inject 12 Units into the skin every evening. 15 mL 11  . levothyroxine (SYNTHROID, LEVOTHROID) 75 MCG tablet Take 1 tablet (75 mcg total) by mouth daily before breakfast. 90 tablet 2  . lisinopril (PRINIVIL,ZESTRIL) 5 MG tablet Take 1 tablet by mouth every morning for kidney protection against diabetes. 90 tablet 1  . metoCLOPramide (REGLAN) 10 MG tablet Take 1 tablet (10 mg total) by mouth every 8 (eight) hours as needed for nausea. 90 tablet 0  . metoprolol tartrate (LOPRESSOR) 25 MG tablet Take 1 tablet (25 mg total) by mouth 2 (two) times daily. 180 tablet 3  . ondansetron (ZOFRAN ODT) 4 MG disintegrating tablet Take 1 tablet (4 mg total) by mouth every 8 (eight) hours as needed for nausea or vomiting. 20 tablet 0  . pantoprazole (PROTONIX) 40 MG tablet Take 1 tablet (40 mg total) by mouth daily. 90 tablet 2  . tiZANidine (ZANAFLEX) 4 MG tablet Take 1 tablet (4 mg total) by mouth 3 (three) times daily as needed for muscle spasms. 90 tablet 0    Results for orders placed or performed during the hospital encounter of 12/14/17 (from the past 48 hour(s))  Glucose, capillary     Status: Abnormal   Collection Time: 12/14/17 11:39 PM  Result Value Ref Range   Glucose-Capillary >600 (HH) 65 - 99 mg/dL  CBC with Differential     Status: Abnormal   Collection Time: 12/14/17 11:43 PM  Result Value Ref Range   WBC 13.7 (H) 3.6 - 11.0 K/uL   RBC 4.76 3.80 - 5.20 MIL/uL   Hemoglobin 11.3 (L)  12.0 - 16.0 g/dL    Comment: RESULT REPEATED AND VERIFIED   HCT 39.0 35.0 - 47.0 %   MCV 82.0 80.0 - 100.0 fL   MCH 23.7 (L) 26.0 - 34.0 pg   MCHC 29.0 (L) 32.0 - 36.0 g/dL   RDW 18.4 (H) 11.5 - 14.5 %   Platelets 256 150 - 440 K/uL   Neutrophils Relative % 90 %   Neutro Abs 12.3 (H) 1.4 - 6.5 K/uL   Lymphocytes Relative 4 %   Lymphs Abs 0.6 (L) 1.0 - 3.6 K/uL   Monocytes Relative 6 %   Monocytes Absolute 0.8 0.2 - 0.9 K/uL   Eosinophils Relative  0 %   Eosinophils Absolute 0.0 0 - 0.7 K/uL   Basophils Relative 0 %   Basophils Absolute 0.0 0 - 0.1 K/uL    Comment: Performed at [facility], [address] [place], [place] [address]  Comprehensive metabolic panel     Status: Abnormal   Collection Time: 12/14/17 11:43 PM  Result Value Ref Range   Sodium 120 (L) 135 - 145 mmol/L   Potassium 2.4 (LL) 3.5 - 5.1 mmol/L    Comment: CRITICAL RESULT CALLED TO, READ BACK BY AND VERIFIED WITH ALLY RILEY AT 0028 12/15/17.PMH   Chloride 86 (L) 101 - 111 mmol/L   CO2 21 (L) 22 - 32 mmol/L   Glucose, Bld 984 (HH) 65 - 99 mg/dL    Comment: CRITICAL RESULT CALLED TO, READ BACK BY AND VERIFIED WITH ALLY RILEY AT 0028 12/15/17.PMH   BUN 14 6 - 20 mg/dL   Creatinine, Ser 1.86 (H) 0.44 - 1.00 mg/dL   Calcium 7.9 (L) 8.9 - 10.3 mg/dL   Total Protein 7.1 6.5 - 8.1 g/dL   Albumin 2.6 (L) 3.5 - 5.0 g/dL   AST 30 15 - 41 U/L   ALT 26 14 - 54 U/L   Alkaline Phosphatase 176 (H) 38 - 126 U/L   Total Bilirubin 1.0 0.3 - 1.2 mg/dL   GFR calc non Af Amer 28 (L) >60 mL/min   GFR calc Af Amer 32 (L) >60 mL/min    Comment: (NOTE) The eGFR has been calculated using the CKD EPI equation. This calculation has not been validated in all clinical situations. eGFR's persistently <60 mL/min signify possible Chronic Kidney Disease.    Anion gap 13 5 - 15    Comment: Performed at [facility], [address] [place], [place] [address]  Troponin I     Status: Abnormal   Collection Time: 12/14/17  11:43 PM  Result Value Ref Range   Troponin I 0.07 (HH) <0.03 ng/mL    Comment: CRITICAL RESULT CALLED TO, READ BACK BY AND VERIFIED WITH ALLY RILEY AT 0028 12/15/17.PMH Performed at [facility] [address] [place], [place] [address]   Urinalysis, Complete w Microscopic     Status: Abnormal   Collection Time: 12/14/17 11:43 PM  Result Value Ref Range   Color, Urine YELLOW (A) YELLOW   APPearance CLOUDY (A) CLEAR   Specific Gravity, Urine 1.023 1.005 - 1.030   pH 6.0 5.0 - 8.0   Glucose, UA >=500 (A) NEGATIVE mg/dL   Hgb urine dipstick LARGE (A) NEGATIVE   Bilirubin Urine NEGATIVE NEGATIVE   Ketones, ur NEGATIVE NEGATIVE mg/dL   Protein, ur 30 (A) NEGATIVE mg/dL   Nitrite NEGATIVE NEGATIVE   Leukocytes, UA LARGE (A) NEGATIVE   RBC / HPF TOO NUMEROUS TO COUNT 0 - 5 RBC/hpf   WBC, UA TOO NUMEROUS TO COUNT 0 - 5 WBC/hpf   Bacteria, UA RARE (A) NONE SEEN   Squamous Epithelial / LPF NONE SEEN NONE SEEN   WBC Clumps PRESENT    Budding Yeast PRESENT    Hyphae Yeast PRESENT     Comment: Performed at [facility], [address] [place], [place] [address]  Ammonia     Status: Abnormal   Collection Time: 12/14/17 11:43 PM  Result Value Ref Range   Ammonia 41 (H) 9 - 35 umol/L    Comment: Performed at [facility], [place], [place] [address]  Beta-hydroxybutyric acid  Status: Abnormal   Collection Time: 12/14/17 11:43 PM  Result Value Ref Range   Beta-Hydroxybutyric Acid 0.89 (H) 0.05 - 0.27 mmol/L    Comment: Performed at [facility], [address] [place], [place] [address]  TSH     Status: None   Collection Time: 12/14/17 11:43 PM  Result Value Ref Range   TSH 1.332 0.350 - 4.500 uIU/mL    Comment: Performed by a 3rd Generation assay with a functional sensitivity of <=0.01 uIU/mL. Performed at [facility], [address] [place], [place] [address]   Lactic acid, plasma     Status: Abnormal   Collection  Time: 12/14/17 11:57 PM  Result Value Ref Range   Lactic Acid, Venous 2.5 (HH) 0.5 - 1.9 mmol/L    Comment: CRITICAL RESULT CALLED TO, READ BACK BY AND VERIFIED WITH ALLY RILEY AT 0028 12/15/17.PMH Performed at [facility], [address] [place], [place] [address]   Culture, blood (routine x 2)     Status: None (Preliminary result)   Collection Time: 12/14/17 11:57 PM  Result Value Ref Range   Specimen Description BLOOD BLOOD RIGHT ARM    Special Requests      BOTTLES DRAWN AEROBIC AND ANAEROBIC Blood Culture adequate volume   Culture      NO GROWTH < 12 HOURS Performed at [facility], [address], [place], [place] [address]    Report Status PENDING   Culture, blood (routine x 2)     Status: None (Preliminary result)   Collection Time: 12/15/17 12:25 AM  Result Value Ref Range   Specimen Description BLOOD RIGHT HAND    Special Requests      BOTTLES DRAWN AEROBIC AND ANAEROBIC Blood Culture adequate volume   Culture      NO GROWTH < 12 HOURS Performed at [facility], [address], [place], [place] [address]    Report Status PENDING   Glucose, capillary     Status: Abnormal   Collection Time: 12/15/17  1:12 AM  Result Value Ref Range   Glucose-Capillary >600 (HH) 65 - 99 mg/dL  Glucose, capillary     Status: Abnormal   Collection Time: 12/15/17  2:11 AM  Result Value Ref Range   Glucose-Capillary >600 (HH) 65 - 99 mg/dL  Glucose, capillary     Status: Abnormal   Collection Time: 12/15/17  3:01 AM  Result Value Ref Range   Glucose-Capillary 532 (HH) 65 - 99 mg/dL  Lactic acid, plasma     Status: Abnormal   Collection Time: 12/15/17  3:21 AM  Result Value Ref Range   Lactic Acid, Venous 3.2 (HH) 0.5 - 1.9 mmol/L    Comment: CRITICAL RESULT CALLED TO, READ BACK BY AND VERIFIED WITH JENNA ELLINGTON AT 9179 12/15/17.PMH Performed at [facility], [address] [place], [place] [address]   Glucose, capillary     Status: Abnormal    Collection Time: 12/15/17  4:28 AM  Result Value Ref Range   Glucose-Capillary 427 (H) 65 - 99 mg/dL   Ct Head Wo Contrast  Result Date: 12/15/2017 CLINICAL DATA:  Altered level of consciousness.  Hyperglycemia. EXAM: CT HEAD WITHOUT CONTRAST TECHNIQUE: Contiguous axial images were obtained from the base of the skull through the vertex without intravenous contrast. COMPARISON:  01/12/2016 FINDINGS: Brain: Mild diffuse cerebral atrophy. Low-attenuation changes in the deep white matter consistent small vessel ischemia. No evidence of acute infarction, hemorrhage, hydrocephalus, extra-axial collection or mass lesion/mass effect. Vascular:  Intracranial arterial vascular calcifications are present. Skull: Calvarium appears intact. Sinuses/Orbits: Paranasal sinuses and mastoid air cells are clear. Other: None. IMPRESSION: No acute intracranial abnormalities. Mild chronic atrophy and small vessel ischemic changes. Electronically Signed   By: Lucienne Capers M.D.   On: 12/15/2017 00:56   Dg Chest Port 1 View  Result Date: 12/15/2017 CLINICAL DATA:  Altered mental status today. EXAM: PORTABLE CHEST 1 VIEW COMPARISON:  08/06/2016 FINDINGS: Shallow inspiration. The heart size and mediastinal contours are within normal limits. Both lungs are clear. The visualized skeletal structures are unremarkable. IMPRESSION: No active disease. Electronically Signed   By: Lucienne Capers M.D.   On: 12/15/2017 00:20    Review of Systems  Unable to perform ROS: Mental status change  The patient is confused and unable to contribute to her history.  Blood pressure 123/64, pulse (!) 104, temperature 98 F (36.7 C), temperature source Oral, resp. rate 17, height _0  (1.6 m), weight 61.2 kg (135 lb), SpO2 100 %. Physical Exam  Vitals reviewed. Constitutional: She appears well-developed and well-nourished. No distress.  HENT:  Head: Normocephalic and atraumatic.  Mouth/Throat: Oropharynx is clear and moist.  Eyes:  Pupils are equal, round, and reactive to light. Conjunctivae and EOM are normal. No scleral icterus.  Neck: Normal range of motion. Neck supple. No JVD present. No tracheal deviation present. No thyromegaly present.  Cardiovascular: Normal rate, regular rhythm and normal heart sounds. Exam reveals no gallop and no friction rub.  No murmur heard. Respiratory: Effort normal and breath sounds normal.  GI: Soft. Bowel sounds are normal. She exhibits no distension. There is no tenderness.  Genitourinary:  Genitourinary Comments: Deferred  Musculoskeletal: Normal range of motion. She exhibits no edema.  Lymphadenopathy:    She has no cervical adenopathy.  Neurological: She is alert. No cranial nerve deficit. She exhibits normal muscle tone.  Skin: Skin is warm and dry. No rash noted. No erythema.  Psychiatric: She has a normal mood and affect. Her behavior is normal.  Difficult to assess thought judgment as the patient is confused and delirious.     Assessment/Plan This is a 62 year old female admitted for hyperglycemia. 1.  Diabetes mellitus type 2: with hyperglycemia.  (The patient's history reports diabetes type 1 although this is likely an error as the patient does not currently have DKA with her very high blood sugar).  Resume basal insulin therapy now.  Continue with sliding scale insulin. 2.  Sepsis: The patient meets criteria via tachycardia, tachypnea and leukocytosis.  Source is urine.  Continue ceftriaxone.  Follow blood and urine cultures for growth and sensitivities.  She is hemodynamically stable. 3.  UTI: Present on admission.  Antibiotics as above. 4.  Atrial fibrillation: Paroxysmal; continue Eliquis 5.  Hypothyroidism: Check TSH; continue Synthroid 6.  DVT prophylaxis: Full dose anticoagulation 7.  GI prophylaxis: Pantoprazole per home regimen The patient is a full code.  Time spent on admission orders and patient care approximately 45 minutes  Harrie Foreman,  MD 12/15/2017, 7:27 AM

## 2017-12-15 NOTE — ED Notes (Signed)
Critical results to Dr. Lurline Hare in person  Lactic acid 2.5 Glucose 984 Potassium 2.8 Troponin 0.07

## 2017-12-15 NOTE — ED Notes (Signed)
Insulin infusing right hand, potassium right forearm, NS right forearm

## 2017-12-15 NOTE — Progress Notes (Signed)
Pharmacy Antibiotic Note  [name] is a 62 y.o. female admitted on 12/14/2017 with UTI.  Pharmacy has been consulted for ceftriaxone  dosing.  Plan: Ceftriaxone 2 grams q 24 hours ordered  Height: 5' 3"  (160 cm) Weight: 135 lb (61.2 kg) IBW/kg (Calculated) : 52.4  Temp (24hrs), Avg:97.9 F (36.6 C), Min:97.9 F (36.6 C), Max:97.9 F (36.6 C)  Recent Labs  Lab 12/14/17 2343 12/14/17 2357  WBC 13.7*  --   CREATININE 1.86*  --   LATICACIDVEN  --  2.5*    Estimated Creatinine Clearance: 25.6 mL/min (A) (by C-G formula based on SCr of 1.86 mg/dL (H)).    Allergies  Allergen Reactions  . Hydrocodone Other (See Comments)    Pt states that this medication caused cirrhosis of the liver.    . Aspirin   . Erythromycin Other (See Comments)    Reaction:  Fever   . Prednisone Other (See Comments)    Reaction:  Unknown   . Rosiglitazone Maleate Swelling  . Codeine Sulfate Rash  . Tetanus-Diphtheria Toxoids Td Rash and Other (See Comments)    Reaction:  Fever     Antimicrobials this admission: Ceftriaxone 4/7  >>    >>   Dose adjustments this admission:   Microbiology results: 4/6 BCx: pending 4/6 UCx: pending       4/6 UA: LE(+) NO2(-)  WBC TNTC  Thank you for allowing pharmacy to be a part of this patient's care.  Ozell Ferrera S 12/15/2017 12:56 AM

## 2017-12-15 NOTE — ED Notes (Signed)
Patient transported to CT 

## 2017-12-15 NOTE — ED Notes (Signed)
Attempted to call report to floor. Primary RN unavailable at this time. Primary RN will call back as soon as able

## 2017-12-15 NOTE — ED Notes (Signed)
Patient's POCT CBG [id number]

## 2017-12-15 NOTE — ED Notes (Signed)
Insulin drip stopped per MD Marcille Blanco. Diamond ordered this RN to discontinue insulin drip

## 2017-12-15 NOTE — Progress Notes (Signed)
CODE SEPSIS - PHARMACY COMMUNICATION  **Broad Spectrum Antibiotics should be administered within 1 hour of Sepsis diagnosis**  Time Code Sepsis Called/Page Received: [id number]  Antibiotics Ordered: 0407 0027 ceftriaxone  Time of 1st antibiotic administration: [id number]  Additional action taken by pharmacy:   If necessary, Name of Provider/Nurse Contacted:     Eloise Harman ,PharmD Clinical Pharmacist  12/15/2017  1:13 AM

## 2017-12-15 NOTE — ED Notes (Signed)
CBG >600 HIGH

## 2017-12-16 LAB — GLUCOSE, CAPILLARY
Glucose-Capillary: 348 mg/dL — ABNORMAL HIGH (ref 65–99)
Glucose-Capillary: 270 mg/dL — ABNORMAL HIGH (ref 65–99)
Glucose-Capillary: 279 mg/dL — ABNORMAL HIGH (ref 65–99)
Glucose-Capillary: 176 mg/dL — ABNORMAL HIGH (ref 65–99)
Glucose-Capillary: 140 mg/dL — ABNORMAL HIGH (ref 65–99)

## 2017-12-16 LAB — CBC
HEMATOCRIT: 33.9 % — AB (ref 35.0–47.0)
HEMOGLOBIN: 10.6 g/dL — AB (ref 12.0–16.0)
MCH: 24.5 pg — ABNORMAL LOW (ref 26.0–34.0)
MCHC: 31.3 g/dL — ABNORMAL LOW (ref 32.0–36.0)
MCV: 78.4 fL — AB (ref 80.0–100.0)
Platelets: 208 10*3/uL (ref 150–440)
RBC: 4.33 MIL/uL (ref 3.80–5.20)
RDW: 18.5 % — AB (ref 11.5–14.5)
WBC: 9.1 10*3/uL (ref 3.6–11.0)

## 2017-12-16 LAB — URINE CULTURE: Culture: 60000 — AB

## 2017-12-16 LAB — BASIC METABOLIC PANEL
ANION GAP: 7 (ref 5–15)
BUN: 14 mg/dL (ref 6–20)
CHLORIDE: 108 mmol/L (ref 101–111)
CO2: 18 mmol/L — ABNORMAL LOW (ref 22–32)
Calcium: 7.3 mg/dL — ABNORMAL LOW (ref 8.9–10.3)
Creatinine, Ser: 1.41 mg/dL — ABNORMAL HIGH (ref 0.44–1.00)
GFR calc Af Amer: 45 mL/min — ABNORMAL LOW (ref >60)
GFR calc non Af Amer: 39 mL/min — ABNORMAL LOW (ref >60)
GLUCOSE: 273 mg/dL — AB (ref 65–99)
POTASSIUM: 3.5 mmol/L (ref 3.5–5.1)
Sodium: 133 mmol/L — ABNORMAL LOW (ref 135–145)

## 2017-12-16 LAB — HEMOGLOBIN A1C
Hgb A1c MFr Bld: 15.5 % — ABNORMAL HIGH (ref 4.8–5.6)
Mean Plasma Glucose: 398 mg/dL

## 2017-12-16 IMAGING — US US PARACENTESIS
1 series · 6 of 6 positions shown · non-contrast
Comparison: none

CLINICAL DATA: Ascites

EXAM:
ULTRASOUND GUIDED PARACENTESIS
TECHNIQUE: Survey ultrasound of the abdomen was performed and an appropriate
skin entry site in the left lower abdomen was selected. Skin site
was marked, prepped with Betadine, and draped in usual sterile
fashion, and infiltrated locally with 1% lidocaine. A
Safe-T-Centesis needle was advanced into the peritoneal space until
fluid could be aspirated. The sheath was advanced and the needle
removed. 3.75 L of clear yellowascites were aspirated. Samples were
sent for the requested laboratory studies
COMPLICATIONS:
COMPLICATIONS
none

[Series 1: us paracentesis · 0.30mm/px · 6 of 6 slices shown]
[im 1/6]
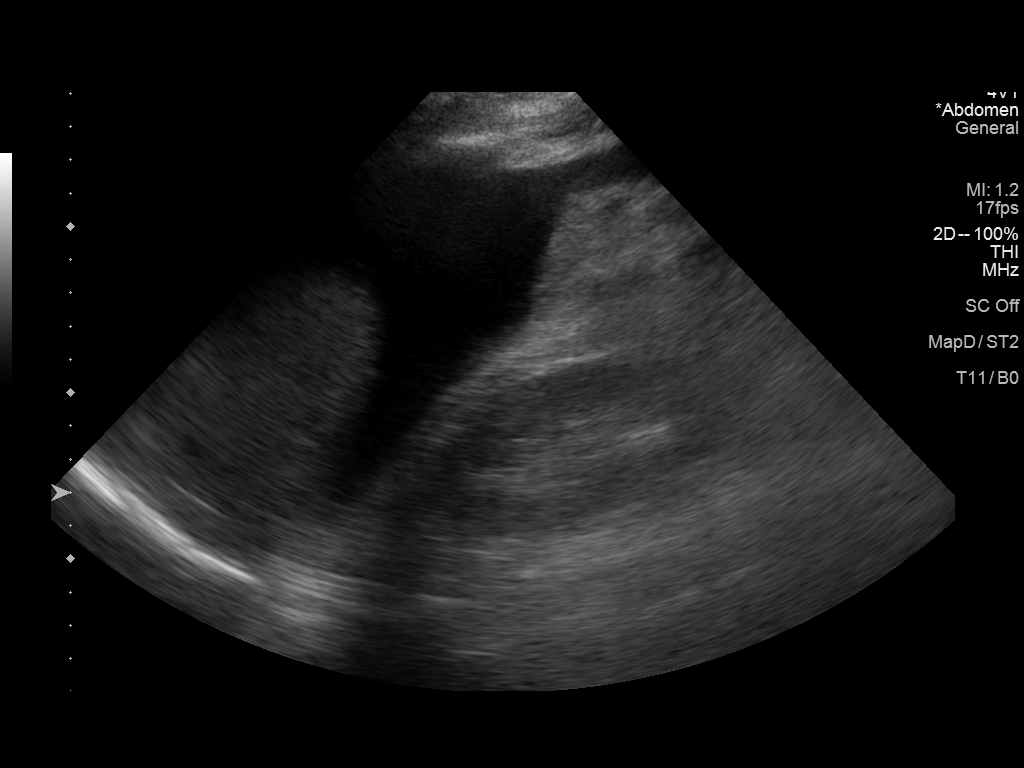
[im 2/6]
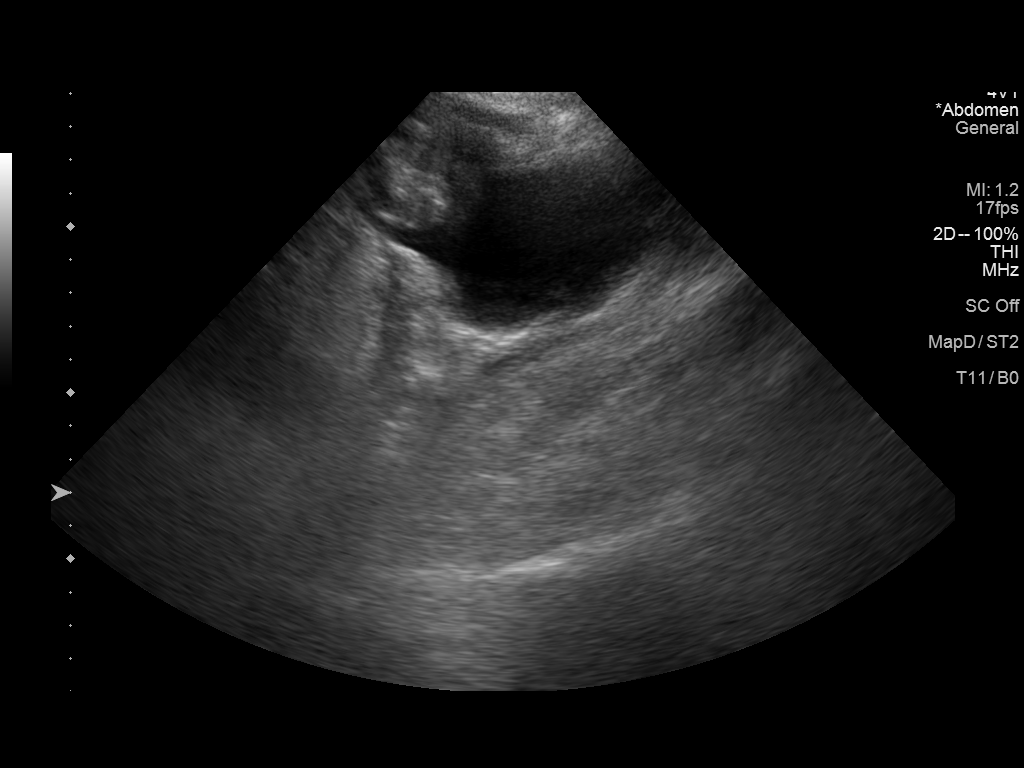
[im 3/6]
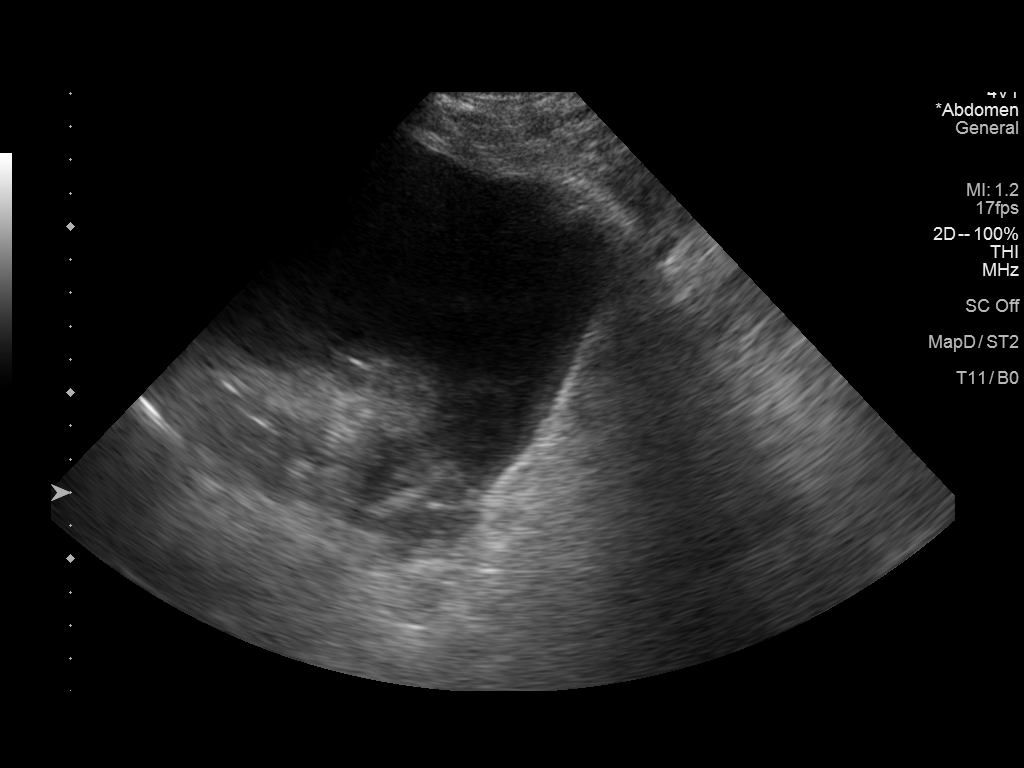
[im 4/6]
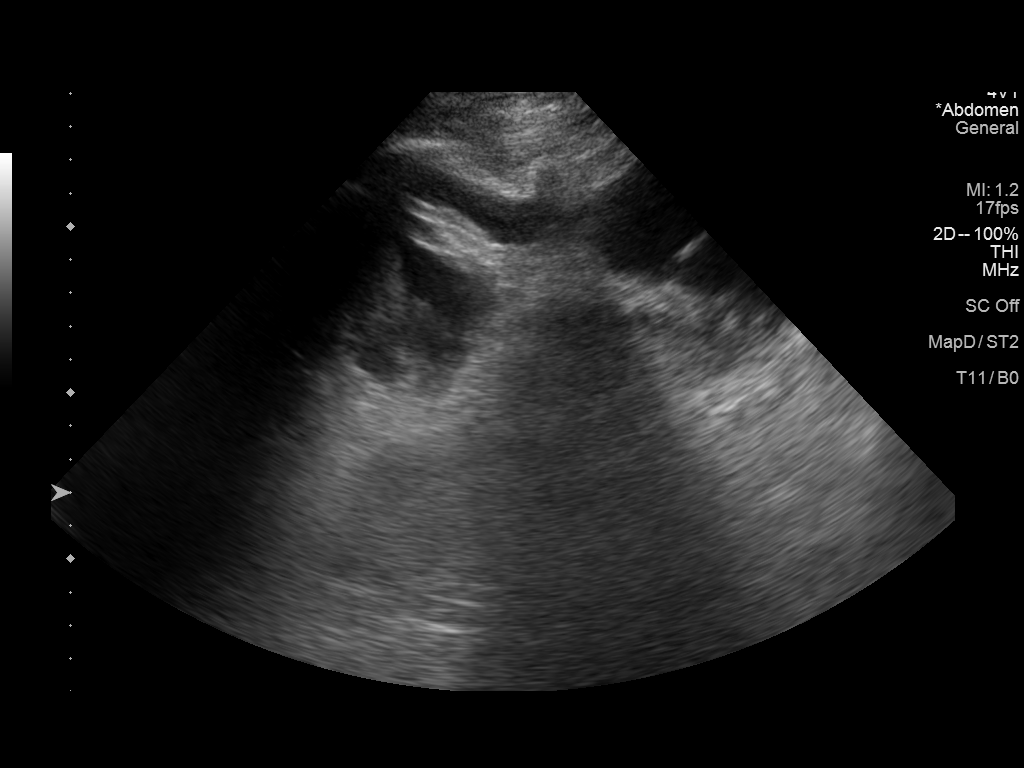
[im 5/6]
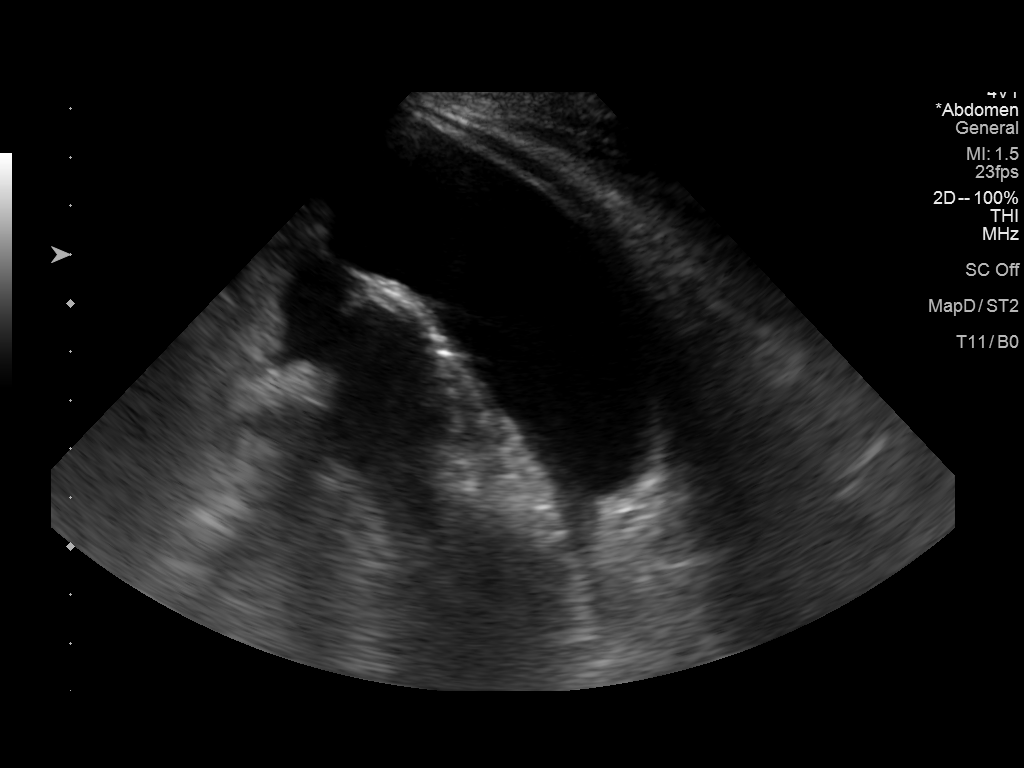
[im 6/6]
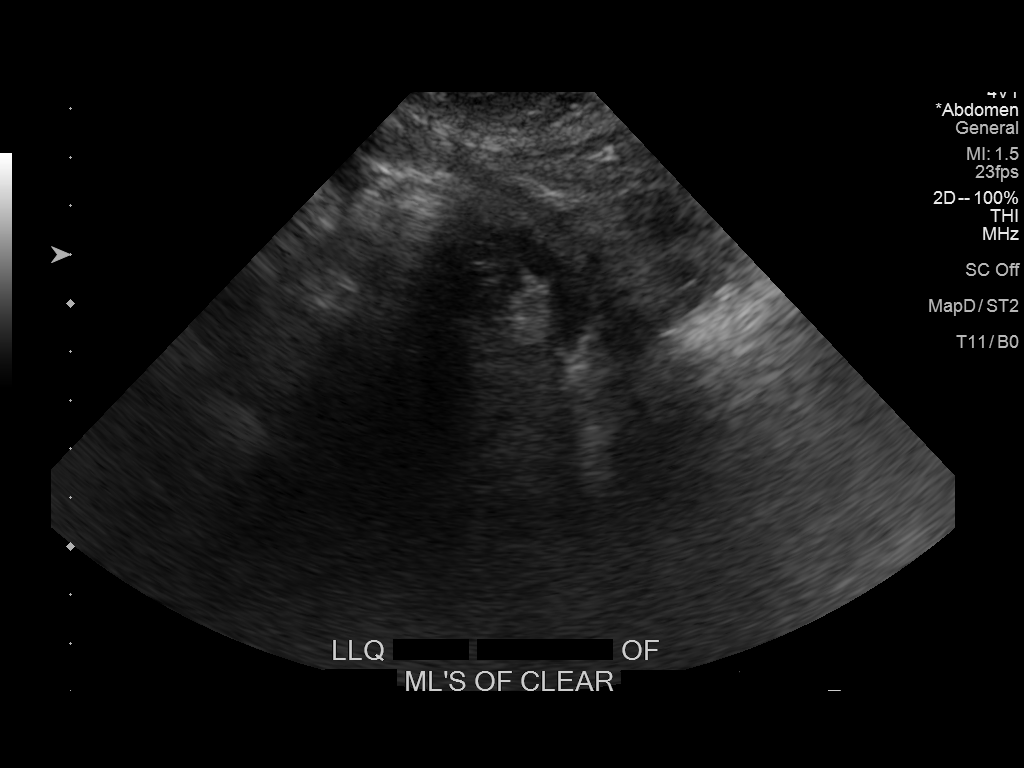

[6 of 6 positions shown; findings below may reference images not displayed]

IMPRESSION: Technically successful ultrasound guided paracentesis, removing
L of ascites.

## 2017-12-16 MED ORDER — PREMIER PROTEIN SHAKE
11.0000 [oz_av] | Freq: Two times a day (BID) | ORAL | Status: DC
  Administered 2017-12-17: 11 [oz_av] via ORAL

## 2017-12-16 MED ORDER — INSULIN ASPART 100 UNIT/ML ~~LOC~~ SOLN
5.0000 [IU] | Freq: Three times a day (TID) | SUBCUTANEOUS | Status: DC
  Administered 2017-12-17: 5 [IU] via SUBCUTANEOUS
  Filled 2017-12-16: qty 1

## 2017-12-16 MED ORDER — ADULT MULTIVITAMIN W/MINERALS CH
1.0000 | ORAL_TABLET | Freq: Every day | ORAL | Status: DC
  Administered 2017-12-17: 1 via ORAL
  Filled 2017-12-16: qty 1

## 2017-12-16 MED ORDER — HALOPERIDOL LACTATE 5 MG/ML IJ SOLN
2.0000 mg | Freq: Once | INTRAMUSCULAR | Status: AC
  Administered 2017-12-16: 2 mg via INTRAVENOUS
  Filled 2017-12-16: qty 1

## 2017-12-16 MED ORDER — INSULIN GLARGINE 100 UNIT/ML ~~LOC~~ SOLN
23.0000 [IU] | Freq: Every day | SUBCUTANEOUS | Status: DC
  Administered 2017-12-17: 23 [IU] via SUBCUTANEOUS
  Filled 2017-12-16 (×2): qty 0.23

## 2017-12-16 NOTE — Plan of Care (Signed)
Patient continues to be confused and slightly agitated. Patient is only oriented to self and is difficult to re-direct.  Currently insisting to go home and climb out of the bed.  One Time 52m haldol IV pushed.

## 2017-12-16 NOTE — Progress Notes (Signed)
[place] [facility] [place] NAME: [name]    MR#:  [id number]  DATE OF BIRTH:  August 01, 1956  SUBJECTIVE:   His blood sugar significantly improved today.  Patient is more confused today and keeps saying " I have no Money" potassium level has also improved.  REVIEW OF SYSTEMS:    Review of Systems  Unable to perform ROS: Mental acuity    Nutrition: heart Healthy/Carb control Tolerating Diet: Yes Tolerating PT: Await Eval.   DRUG ALLERGIES:   Allergies  Allergen Reactions  . Hydrocodone Other (See Comments)    Pt states that this medication caused cirrhosis of the liver.    . Aspirin   . Erythromycin Other (See Comments)    Reaction:  Fever   . Prednisone Other (See Comments)    Reaction:  Unknown   . Rosiglitazone Maleate Swelling  . Codeine Sulfate Rash  . Tetanus-Diphtheria Toxoids Td Rash and Other (See Comments)    Reaction:  Fever     VITALS:  Blood pressure (!) 142/82, pulse 85, temperature 98.2 F (36.8 C), temperature source Oral, resp. rate 18, height 5' 3"  (1.6 m), weight 64.4 kg (142 lb), SpO2 100 %.  PHYSICAL EXAMINATION:   Physical Exam  GENERAL:  62 y.o.-year-old patient lying in bed confused and keeps saying " I have no Money" EYES: Pupils equal, round, reactive to light and accommodation. No scleral icterus. Extraocular muscles intact.  HEENT: Head atraumatic, normocephalic. Oropharynx and nasopharynx clear.  NECK:  Supple, no jugular venous distention. No thyroid enlargement, no tenderness.  LUNGS: Normal breath sounds bilaterally, no wheezing, rales, rhonchi. No use of accessory muscles of respiration.  CARDIOVASCULAR: S1, S2 normal. No murmurs, rubs, or gallops.  ABDOMEN: Soft, nontender, nondistended. Bowel sounds present. No organomegaly or mass.  EXTREMITIES: No cyanosis, clubbing or edema b/l.    NEUROLOGIC: Cranial nerves II through XII are intact. No focal Motor or sensory deficits b/l. Globally weak.   PSYCHIATRIC: The patient is alert and oriented x 1.  SKIN: No obvious rash, lesion, or ulcer.    LABORATORY PANEL:   CBC Recent Labs  Lab 12/16/17 0500  WBC 9.1  HGB 10.6*  HCT 33.9*  PLT 208   ------------------------------------------------------------------------------------------------------------------  Chemistries  Recent Labs  Lab 12/14/17 2343 12/15/17 0952 12/16/17 0500  NA 120* 131* 133*  K 2.4* 2.8* 3.5  CL 86* 101 108  CO2 21* 21* 18*  GLUCOSE 984* 395* 273*  BUN 14 12 14   CREATININE 1.86* 1.29* 1.41*  CALCIUM 7.9* 7.3* 7.3*  MG  --  1.7  --   AST 30  --   --   ALT 26  --   --   ALKPHOS 176*  --   --   BILITOT 1.0  --   --    ------------------------------------------------------------------------------------------------------------------  Cardiac Enzymes Recent Labs  Lab 12/14/17 2343  TROPONINI 0.07*   ------------------------------------------------------------------------------------------------------------------  RADIOLOGY:  Dg Thoracic Spine 2 View  Result Date: 12/15/2017 CLINICAL DATA:  Multiple recent falls.  Pain. EXAM: THORACIC SPINE 2 VIEWS COMPARISON:  Chest radiograph, 07/09/2016 FINDINGS: No fracture.  No spondylolisthesis. Mild dextroscoliosis, apex at T8. No bone lesion.  No degenerative changes. Soft tissues are unremarkable. IMPRESSION: 1. No fracture or acute finding. 2. Mild dextroscoliosis.  No other abnormality. Electronically Signed   By: Lajean Manes M.D.   On: 12/15/2017 13:57   Dg Lumbar Spine 2-3 Views  Result Date: 12/15/2017 CLINICAL DATA:  Multiple recent falls.  Pain. EXAM: LUMBAR SPINE - 2-3 VIEW COMPARISON:  CT, 08/06/2016. FINDINGS: Mild levoscoliosis, apex at L2. No fracture. No spondylolisthesis. Mild loss of disc height at L5-S1. No other degenerative change. Scattered aortic vascular calcifications. Right ureteral stent appears stable from the prior CT. IMPRESSION: 1. No fracture or acute finding. 2.  Levoscoliosis and mild L5-S1 disc degenerative changes. Electronically Signed   By: Lajean Manes M.D.   On: 12/15/2017 13:56   Dg Pelvis 1-2 Views  Result Date: 12/15/2017 CLINICAL DATA:  Multiple recent falls.  Pain. EXAM: PELVIS - 1-2 VIEW COMPARISON:  CT, 08/06/2016 FINDINGS: No fracture.  No bone lesion. The hip joints, SI joints and symphysis pubis are normally spaced and aligned. Right mid to distal ureteral stent. Soft tissues otherwise unremarkable. IMPRESSION: 1. No fracture, bone lesion or joint abnormality. Electronically Signed   By: Lajean Manes M.D.   On: 12/15/2017 13:54   Ct Head Wo Contrast  Result Date: 12/15/2017 CLINICAL DATA:  Altered level of consciousness.  Hyperglycemia. EXAM: CT HEAD WITHOUT CONTRAST TECHNIQUE: Contiguous axial images were obtained from the base of the skull through the vertex without intravenous contrast. COMPARISON:  01/12/2016 FINDINGS: Brain: Mild diffuse cerebral atrophy. Low-attenuation changes in the deep white matter consistent small vessel ischemia. No evidence of acute infarction, hemorrhage, hydrocephalus, extra-axial collection or mass lesion/mass effect. Vascular: Intracranial arterial vascular calcifications are present. Skull: Calvarium appears intact. Sinuses/Orbits: Paranasal sinuses and mastoid air cells are clear. Other: None. IMPRESSION: No acute intracranial abnormalities. Mild chronic atrophy and small vessel ischemic changes. Electronically Signed   By: Lucienne Capers M.D.   On: 12/15/2017 00:56   Dg Chest Port 1 View  Result Date: 12/15/2017 CLINICAL DATA:  Altered mental status today. EXAM: PORTABLE CHEST 1 VIEW COMPARISON:  08/06/2016 FINDINGS: Shallow inspiration. The heart size and mediastinal contours are within normal limits. Both lungs are clear. The visualized skeletal structures are unremarkable. IMPRESSION: No active disease. Electronically Signed   By: Lucienne Capers M.D.   On: 12/15/2017 00:20     ASSESSMENT AND PLAN:    62 year old female with past medical history of hypertension, hypothyroidism, GERD, history of gastroparesis, alcoholic liver cirrhosis, anxiety, chronic pain, history of pancreatitis, previous history of CVA who presents to the hospital due to weakness, altered mental status and noted to have acute kidney injury with severe hyperglycemia noted to have a urinary tract infection.  1.  Altered mental status-metabolic encephalopathy secondary to acute kidney injury/UTI and dehydration. -Continue IV ceftriaxone for the UTI, continue IV fluids -Mental status not back to baseline yet.  Patient having episodes of delirium.  2.  Urinary tract infection- continue IV ceftriaxone, follow urine cultures negative so far.  3.  Diabetes type 2 uncontrolled with hyperglycemia-patient presented to the hospital blood sugars greater than 900.  No evidence of DKA.   - Continue IV fluids, Appreciate diabetes coordinator input.  We will advance Lantus dose, add NovoLog with meals and continue sliding scale insulin.  Blood sugars have improved since admission.    4.  Hypokalemia- improved w/ supplementation and will cont. To monitor.   5.  Back pain- x-rays of her lumbar and thoracic spine and also her pelvis were negative yesterday.  Patient has chronic pain issues.  Continue OxyContin for now.  6. Hypothyroidism - cont. Synthroid.   7. Hyperlipidemia - cont. Atorvastatin.   Await physical therapy evaluation.  All the records are reviewed and case discussed with Care Management/Social Worker. Management plans discussed with the patient, family  and they are in agreement.  CODE STATUS: Full code  DVT Prophylaxis: Eliquis  TOTAL TIME TAKING CARE OF THIS PATIENT: 30 minutes.   POSSIBLE D/C IN 2-3 DAYS, DEPENDING ON CLINICAL CONDITION.   Henreitta Leber M.D on 12/16/2017 at 2:14 PM  Between 7am to 6pm - Pager - [phone]  After 6pm go to [email or web address] - Proofreader  [facility]  Office  [phone]  CC: Primary care physician; [name], NP

## 2017-12-16 NOTE — Progress Notes (Signed)
Initial Nutrition Assessment  DOCUMENTATION CODES:   Non-severe (moderate) malnutrition in context of chronic illness  INTERVENTION:  Will downgrade diet to dysphagia 3 with thin liquids.  Provide Premier Protein po BID, each supplement provides 160 kcal and 30 grams of protein.  Provide daily MVI.  NUTRITION DIAGNOSIS:   Moderate Malnutrition related to chronic illness(gastroparesis) as evidenced by mild fat depletion, mild muscle depletion, moderate muscle depletion.  GOAL:   Patient will meet greater than or equal to 90% of their needs  MONITOR:   PO intake, Supplement acceptance, Labs, Weight trends, I & O's, Skin  REASON FOR ASSESSMENT:   Malnutrition Screening Tool, Consult Assessment of nutrition requirement/status, Poor PO  ASSESSMENT:   62 year old female with PMHx of DM type 1, hypothyroidism, anxiety, GERD, hx hiatal hernia s/p repair, TIA 2016, pancreatitis, paroxysmal A-fib, hx CVA, gastroparesis who is admitted with AMS and UTI.   Met with patient at bedside. She is very confused and unable to provide any history. She kept repeating that RD was moving around the room too quickly. Per chart patient had 50% of breakfast this morning and 100% of lunch today. During NFPE patient would not cooperate with opening her mouth for RD to review. She is certainly missing most if not all of her teeth but difficult to tell.  Per chart patient's weight has been increasing the past few years.  Medications reviewed and include: acidophilus 2 capsules daily, Colace, Novolog 0-20 units TID, Novolog 0-5 units QHS, Novolog 5 units TID, Lantus 23 units QHS, levothyroxine, pantoprazole, potassium chloride 20 mEq BID, NS with KCl 20 mEq/L at 75 mL/hr, ceftriaxone.  Labs reviewed: CBG 117-279 past 24 hrs, Sodium 133, CO2 18, Creatinine 1.41.  Discussed with RN. Patient able to eat well with help.  NUTRITION - FOCUSED PHYSICAL EXAM:    Most Recent Value  Orbital Region  Moderate  depletion  Upper Arm Region  Mild depletion  Thoracic and Lumbar Region  Mild depletion  Buccal Region  Moderate depletion  Temple Region  Moderate depletion  Clavicle Bone Region  Mild depletion  Clavicle and Acromion Bone Region  Mild depletion  Scapular Bone Region  Mild depletion  Dorsal Hand  Moderate depletion  Patellar Region  Moderate depletion  Anterior Thigh Region  Moderate depletion  Posterior Calf Region  Moderate depletion  Edema (RD Assessment)  None  Hair  Reviewed  Eyes  Reviewed  Mouth  Unable to assess  Skin  Reviewed  Nails  Reviewed     Diet Order:  Diet heart healthy/carb modified Room service appropriate? Yes; Fluid consistency: Thin  EDUCATION NEEDS:   Not appropriate for education at this time  Skin:  Skin Assessment: Skin Integrity Issues:(MSAD to groin)  Last BM:  12/15/2017  Height:   Ht Readings from Last 1 Encounters:  12/15/17 _0  (1.6 m)    Weight:   Wt Readings from Last 1 Encounters:  12/16/17 142 lb (64.4 kg)    Ideal Body Weight:  52.3 kg  BMI:  Body mass index is 25.15 kg/m.  Estimated Nutritional Needs:   Kcal:  1410-1640 (MSJ x 1.2-1.4)  Protein:  75-90 grams (1.2-1.4 grams/kg)  Fluid:  1.6-1.9 L/day (25-30 mL/kg)  Willey Blade, MS, RD, LDN Office: [phone] Pager: [phone] After Hours/Weekend Pager: [phone]

## 2017-12-16 NOTE — Progress Notes (Addendum)
Inpatient Diabetes Program Recommendations  AACE/ADA: New Consensus Statement on Inpatient Glycemic Control (2015)  Target Ranges:  Prepandial:   less than 140 mg/dL      Peak postprandial:   less than 180 mg/dL (1-2 hours)      Critically ill patients:  140 - 180 mg/dL  Results for [name], [name] (MRN [id number]) as of 12/16/2017 09:00  Ref. Range 12/15/2017 04:28 12/15/2017 04:59 12/15/2017 07:55 12/15/2017 09:11 12/15/2017 11:48 12/15/2017 15:57 12/15/2017 21:09 12/16/2017 07:50  Glucose-Capillary Latest Ref Range: 65 - 99 mg/dL 427 (H) 348 (H) 354 (H) 356 (H) 264 (H) 117 (H) 250 (H) 270 (H)   Results for [name], [name] (MRN [id number]) as of 12/16/2017 09:00  Ref. Range 09/17/2016 12:59 12/14/2017 23:43  Hemoglobin A1C Latest Ref Range: 4.8 - 5.6 % 8.3 (H) 15.5 (H)   Review of Glycemic Control  Diabetes history: DM2 Outpatient Diabetes medications: Lantus 12 units QPM, Novolog 5 TID with meals if CBG > 130 mg/dl Current orders for Inpatient glycemic control: Lantus 18 units QHS, Novolog 0-20 units TID with meals, Novolog 0-5 units QHS  Inpatient Diabetes Program Recommendations:  Insulin - Basal: Please consider increasing Lantus to 23 units QHS. Insulin - Meal Coverage: Please consider ordering Novolog 5 units TID with meals for meal coverage if patient eats at least 50% of meals. HgbA1C: A1C 15.5% n 12/14/17 indicating an average glucose of 398 mg/dl over the past 2-3 months.  Addendum 12/16/17@12 :00-Went by to talk with patient regarding DM control and outpatient regimen. Patient appears to be confused and keeps saying "I have no money."  RN and MD at bedside and aware of altered mental status. Will plan to follow up with patient tomorrow to see if she is appropriate to discuss DM control.  Thanks, Barnie Alderman, RN, MSN, CDE Diabetes Coordinator Inpatient Diabetes Program [phone] (Team Pager from 8am to 5pm)

## 2017-12-16 NOTE — NC FL2 (Signed)
[place] LEVEL OF CARE SCREENING TOOL     IDENTIFICATION  Patient Name: [name] Birthdate: [birth year] Sex: female Admission Date (Current Location): 12/14/2017  [place] and [place] Number:  Engineering geologist and Address:  [facility], [address] [place], [place] [address]      Provider Number: [id number]  Attending Physician Name and Address:  Henreitta Leber, MD  Relative Name and Phone Number:       Current Level of Care: Hospital Recommended Level of Care: [place] Prior Approval Number:    Date Approved/Denied:   PASRR Number: [id number] )  Discharge Plan: SNF    Current Diagnoses: Patient Active Problem List   Diagnosis Date Noted  . Sepsis ([place]) 12/15/2017  . Right ureteral stone 07/14/2016  . Personal history of surgery to heart and great vessels, presenting hazards to health   . Gastritis   . Foreign body in stomach   . Abnormal findings-gastrointestinal tract   . Congestive dilated cardiomyopathy ([place])   . Generalized abdominal pain   . Nausea & vomiting   . NSTEMI (non-ST elevated myocardial infarction) ([place]) 01/11/2016  . Tachyarrhythmia 01/10/2016  . Tachy-brady syndrome ([place] [facility]) 12/28/2015  . Colitis 12/16/2015  . Pneumonia 11/14/2015  . Narcotic withdrawal ([place]) 11/11/2015  . Orthostatic hypotension   . H/O TIA (transient ischemic attack) and stroke   . Left-sided weakness 10/04/2015  . Ileus ([place]) 08/01/2015  . Ascites   . Cryptogenic cirrhosis ([place]) 07/10/2015  . Paroxysmal atrial fibrillation ([place]) 07/10/2015  . C. difficile colitis 07/10/2015  . GI (gastrointestinal bleed) 07/06/2015  . Malnutrition of moderate degree 07/04/2015  . Symptomatic bradycardia 05/27/2015  . Intussusception intestine ([place])   . Hypothyroidism due to amiodarone   . Abdominal pain, chronic, epigastric   . Chronic anemia 05/19/2015  . Thrombocytopenia ([place]) 05/19/2015  . Prolonged QT interval    . Hypomagnesemia   . Narcotic abuse ([place])   . Uncontrolled type 2 diabetes mellitus ([place])   . Syncope due to orthostatic hypotension 05/18/2015  . Hypokalemia 04/06/2015  . Hyperlipidemia with target LDL less than 100 04/06/2015  . CVA (cerebral infarction) 02/15/2015  . Essential hypertension 01/12/2015  . Chronically on opiate therapy 01/12/2015  . Gastroparesis 01/12/2015  . DEPRESSION/ANXIETY 06/27/2007  . MYOFASCIAL PAIN SYNDROME 06/27/2007  . Chronic pain syndrome 03/28/2007  . GERD 03/27/2007  . DIVERTICULOSIS, COLON 03/27/2007  . LUMBAR DISC DISPLACEMENT 03/27/2007  . PROTEINURIA 03/27/2007  . UTERINE CANCER, HX OF 03/27/2007    Orientation RESPIRATION BLADDER Height & Weight     Self  Normal Incontinent Weight: 142 lb (64.4 kg) Height:  5' 3"  (160 cm)  BEHAVIORAL SYMPTOMS/MOOD NEUROLOGICAL BOWEL NUTRITION STATUS      Incontinent Diet(Diet: Heart Healthy/ Carb Modified. )  AMBULATORY STATUS COMMUNICATION OF NEEDS Skin   Extensive Assist Verbally Normal                       Personal Care Assistance Level of Assistance  Bathing, Feeding, Dressing Bathing Assistance: Limited assistance Feeding assistance: Independent Dressing Assistance: Limited assistance     Functional Limitations Info  Sight, Hearing, Speech Sight Info: Adequate Hearing Info: Adequate Speech Info: Adequate    SPECIAL CARE FACTORS FREQUENCY  PT (By licensed PT), OT (By licensed OT)     PT Frequency: (5) OT Frequency: (5)            Contractures      Additional Factors  Info  Code Status, Allergies Code Status Info: (Full Code. ) Allergies Info: (Hydrocodone, Aspirin, Erythromycin, Prednisone, Rosiglitazone Maleate, Codeine Sulfate, Tetanus-diphtheria Toxoids Td)           Current Medications (12/16/2017):  This is the current hospital active medication list Current Facility-Administered Medications  Medication Dose Route Frequency Provider Last Rate Last Dose  . 0.9 %  NaCl with KCl 20 mEq/ L  infusion   Intravenous Continuous Henreitta Leber, MD 75 mL/hr at 12/16/17 0338    . acetaminophen (TYLENOL) tablet 650 mg  650 mg Oral Q6H PRN Harrie Foreman, MD   650 mg at 12/16/17 0941   Or  . acetaminophen (TYLENOL) suppository 650 mg  650 mg Rectal Q6H PRN Harrie Foreman, MD      . acidophilus (RISAQUAD) capsule 2 capsule  2 capsule Oral Daily Harrie Foreman, MD   2 capsule at 12/16/17 0847  . albuterol (PROVENTIL) (2.5 MG/3ML) 0.083% nebulizer solution 2.5 mg  2.5 mg Nebulization Q4H PRN Harrie Foreman, MD      . apixaban [facility] [place]) tablet 5 mg  5 mg Oral BID Harrie Foreman, MD   5 mg at 12/16/17 0848  . atorvastatin (LIPITOR) tablet 40 mg  40 mg Oral QHS Harrie Foreman, MD   40 mg at 12/15/17 2055  . cefTRIAXone (ROCEPHIN) 2 g in sodium chloride 0.9 % 100 mL IVPB  2 g Intravenous Q24H Harrie Foreman, MD   Stopped at 12/16/17 [phone]  . docusate sodium (COLACE) capsule 100 mg  100 mg Oral BID Harrie Foreman, MD   100 mg at 12/16/17 0848  . haloperidol lactate (HALDOL) injection 2 mg  2 mg Intravenous Once Henreitta Leber, MD      . insulin aspart (novoLOG) injection 0-20 Units  0-20 Units Subcutaneous TID WC Harrie Foreman, MD   11 Units at 12/16/17 1358  . insulin aspart (novoLOG) injection 0-5 Units  0-5 Units Subcutaneous QHS Harrie Foreman, MD   2 Units at 12/15/17 2114  . insulin aspart (novoLOG) injection 5 Units  5 Units Subcutaneous TID WC Sainani, Vivek J, MD      . insulin glargine (LANTUS) injection 23 Units  23 Units Subcutaneous QHS Sainani, Belia Heman, MD      . levothyroxine (SYNTHROID, LEVOTHROID) tablet 75 mcg  75 mcg Oral QAC breakfast Harrie Foreman, MD   75 mcg at 12/16/17 0847  . metoCLOPramide (REGLAN) tablet 10 mg  10 mg Oral Q8H PRN Harrie Foreman, MD      . metoprolol tartrate (LOPRESSOR) tablet 25 mg  25 mg Oral BID Harrie Foreman, MD   25 mg at 12/16/17 0847  . ondansetron (ZOFRAN) tablet 4 mg   4 mg Oral Q6H PRN Harrie Foreman, MD       Or  . ondansetron [facility]) injection 4 mg  4 mg Intravenous Q6H PRN Harrie Foreman, MD      . oxyCODONE (OXYCONTIN) 12 hr tablet 10 mg  10 mg Oral Q8H Harrie Foreman, MD   10 mg at 12/16/17 1359  . pantoprazole (PROTONIX) EC tablet 40 mg  40 mg Oral Daily Harrie Foreman, MD   40 mg at 12/16/17 0848  . potassium chloride (KLOR-CON) packet 20 mEq  20 mEq Oral BID Henreitta Leber, MD   20 mEq at 12/16/17 0847  . potassium chloride 10 mEq in 100 mL IVPB  10 mEq Intravenous Once Marcille Blanco,  Norva Riffle, MD         Discharge Medications: Please see discharge summary for a list of discharge medications.  Relevant Imaging Results:  Relevant Lab Results:   Additional Information (SSN: [id number])  Gaetano Romberger, Veronia Beets, LCSW

## 2017-12-16 NOTE — Consult Note (Signed)
   [facility] CM Inpatient Consult   12/16/2017  [name] June 05, 1956 [id number]     [facility] Care Management consult received for DM management.   Chart reviewed. Noted patient has some confusion. Will not attempt to contact patient at this time via phone to discuss [place] Management. Also noted PT eval is pending.   Will defer to [facility] Liaison for follow up tomorrow. It does not appear patient is for dc today.  Spoke with inpatient RNCM Lattie Haw) to discuss all of above notes.   Marthenia Rolling, MSN-Ed, RN,BSN [facility] Liaison [phone]

## 2017-12-17 LAB — BASIC METABOLIC PANEL
Anion gap: 6 (ref 5–15)
BUN: 12 mg/dL (ref 6–20)
CHLORIDE: 108 mmol/L (ref 101–111)
CO2: 19 mmol/L — AB (ref 22–32)
Calcium: 7.5 mg/dL — ABNORMAL LOW (ref 8.9–10.3)
Creatinine, Ser: 1.23 mg/dL — ABNORMAL HIGH (ref 0.44–1.00)
GFR calc non Af Amer: 46 mL/min — ABNORMAL LOW (ref >60)
GFR, EST AFRICAN AMERICAN: 53 mL/min — AB (ref >60)
Glucose, Bld: 195 mg/dL — ABNORMAL HIGH (ref 65–99)
POTASSIUM: 3.8 mmol/L (ref 3.5–5.1)
SODIUM: 133 mmol/L — AB (ref 135–145)

## 2017-12-17 LAB — GLUCOSE, CAPILLARY
Glucose-Capillary: 164 mg/dL — ABNORMAL HIGH (ref 65–99)
Glucose-Capillary: 168 mg/dL — ABNORMAL HIGH (ref 65–99)
Glucose-Capillary: 221 mg/dL — ABNORMAL HIGH (ref 65–99)

## 2017-12-17 MED ORDER — INSULIN DETEMIR 100 UNIT/ML FLEXPEN: 12.0000 [IU] | PEN_INJECTOR | Freq: Every evening | SUBCUTANEOUS | 1 refills | Status: DC

## 2017-12-17 MED ORDER — ATORVASTATIN CALCIUM 40 MG PO TABS: 40.0000 mg | ORAL_TABLET | Freq: Every day | ORAL | 0 refills | Status: DC

## 2017-12-17 MED ORDER — LISINOPRIL 5 MG PO TABS: ORAL_TABLET | ORAL | 0 refills | Status: DC

## 2017-12-17 MED ORDER — METOPROLOL TARTRATE 25 MG PO TABS: 25.0000 mg | ORAL_TABLET | Freq: Two times a day (BID) | ORAL | 0 refills | Status: DC

## 2017-12-17 MED ORDER — ALBUTEROL SULFATE HFA 108 (90 BASE) MCG/ACT IN AERS: 2.0000 | INHALATION_SPRAY | Freq: Four times a day (QID) | RESPIRATORY_TRACT | 0 refills | Status: DC | PRN

## 2017-12-17 MED ORDER — BUDESONIDE-FORMOTEROL FUMARATE 160-4.5 MCG/ACT IN AERO: 2.0000 | INHALATION_SPRAY | Freq: Two times a day (BID) | RESPIRATORY_TRACT | 0 refills | Status: DC

## 2017-12-17 MED ORDER — INSULIN ASPART 100 UNIT/ML FLEXPEN: PEN_INJECTOR | SUBCUTANEOUS | 1 refills | Status: DC

## 2017-12-17 MED ORDER — LEVOTHYROXINE SODIUM 75 MCG PO TABS: 75.0000 ug | ORAL_TABLET | Freq: Every day | ORAL | 0 refills | Status: DC

## 2017-12-17 MED ORDER — APIXABAN 5 MG PO TABS: 5.0000 mg | ORAL_TABLET | Freq: Two times a day (BID) | ORAL | 0 refills | Status: DC

## 2017-12-17 NOTE — Care Management (Signed)
Spoke with spouse, Cesily Cuoco. Discussed home health services s/p discharge. He states patient will say yes in the hospital then when some one comes out she will not let them come in the home. He state patient does not like anyone in her home and doesn't want home health. He declined services at this time. RNCM provide her contact number in the event they change their mind.

## 2017-12-17 NOTE — Discharge Summary (Signed)
[facility] NAME: [name]    MR#:  [id number]  DATE OF BIRTH:  1956/01/30  DATE OF ADMISSION:  12/14/2017 ADMITTING PHYSICIAN: Harrie Foreman, MD  DATE OF DISCHARGE: 12/17/2017  PRIMARY CARE PHYSICIAN: Patient, No Pcp Per    ADMISSION DIAGNOSIS:  Dehydration [E86.0] Hypokalemia [E87.6] Lower urinary tract infectious disease [N39.0] Hyponatremia [E87.1] Hyperglycemia [R73.9] Sepsis, due to unspecified organism ([place]) [A41.9] Altered mental status, unspecified altered mental status type [R41.82]  DISCHARGE DIAGNOSIS:  Active Problems:   Sepsis ([place])   SECONDARY DIAGNOSIS:   Past Medical History:  Diagnosis Date  . Allergy   . Anxiety   . Cancer (HCC)    HX OF CANCER OF UTERUS   . Cirrhosis of liver not due to alcohol ([place]) 2016  . Degenerative disk disease   . Diverticulitis   . Gastroparesis   . GERD (gastroesophageal reflux disease)   . History of hiatal hernia   . Hypertension   . Hypothyroid   . Intussusception intestine ([place]) 05/2015  . PAF (paroxysmal atrial fibrillation) ([place]) 03/2015   a. new onset 03/2015 in setting of intractable N/V; b. on Eliquis 5 mg bid; c. CHADSVASc 4 (DM, TIA x 2, female)  . Pancreatitis   . Sick sinus syndrome ([place])   . Stomach ulcer   . Stroke [facility])    with minimal left sided weakness  . Syncope 01/2015  . TIA (transient ischemic attack) 02/2015  . Type 1 diabetes ([place])    on levemir    HOSPITAL COURSE:   62 year old female with past medical history of hypertension, hypothyroidism, GERD, history of gastroparesis, alcoholic liver cirrhosis, anxiety, chronic pain, history of pancreatitis, previous history of CVA who presents to the hospital due to weakness, altered mental status and noted to have acute kidney injury with severe hyperglycemia noted to have a urinary tract infection.  1.  Altered mental status- metabolic encephalopathy secondary to acute kidney injury/  suspected UTI and dehydration. -Patient was treated with IV fluids, given antibiotics for the UTI but the urine cultures grew out yeast and therefore UTI was ruled out.  Patient's renal function improved with IV fluids her blood sugars have corrected.  Her mental status is close to baseline now.  She does have episodes of delirium which is sort of her baseline.  2.  Urinary tract infection-  on the hospital patient was treated with IV ceftriaxone, her urine cultures grew out yeast.  She has been afebrile and stable.  She is presently not being discharged on antibiotics.  3.  Diabetes type 2 uncontrolled with hyperglycemia-patient presented to the hospital blood sugars greater than 900.  No evidence of DKA.  -Patient is noncompliant with her medications.  She was given IV fluids, diabetes coordinator consult was obtained.  Her blood sugars improved after she got some Lantus insulin along with NovoLog with meals.  Patient is presently being discharged back on her Levemir and NovoLog with meals.  Further changes to her diabetic regimen can be done as an outpatient.   4.  Hypokalemia-  secondary to dehydration, and has improved and resolved with supplementation.  5.  Back pain- x-rays of her lumbar and thoracic spine and also her pelvis were negative.  Patient has chronic pain issues.   She will cont. Her XTampza ER.   6. Hypothyroidism - she will cont. Synthroid.   7. Hyperlipidemia - she will cont. Atorvastatin.  Patient apparently has not filled her prescriptions  in a long time.  She was given a 30-day supply of most of her medications.  She is in the process of getting herself a primary care physician.  Physical therapy recommended home health services but patient refused.  Patient is therefore being discharged home today.   DISCHARGE CONDITIONS:   Stable  CONSULTS OBTAINED:    DRUG ALLERGIES:   Allergies  Allergen Reactions  . Hydrocodone Other (See Comments)    Pt states that  this medication caused cirrhosis of the liver.    . Aspirin   . Erythromycin Other (See Comments)    Reaction:  Fever   . Prednisone Other (See Comments)    Reaction:  Unknown   . Rosiglitazone Maleate Swelling  . Codeine Sulfate Rash  . Tetanus-Diphtheria Toxoids Td Rash and Other (See Comments)    Reaction:  Fever     DISCHARGE MEDICATIONS:   Allergies as of 12/17/2017      Reactions   Hydrocodone Other (See Comments)   Pt states that this medication caused cirrhosis of the liver.     Aspirin    Erythromycin Other (See Comments)   Reaction:  Fever    Prednisone Other (See Comments)   Reaction:  Unknown    Rosiglitazone Maleate Swelling   Codeine Sulfate Rash   Tetanus-diphtheria Toxoids Td Rash, Other (See Comments)   Reaction:  Fever       Medication List    STOP taking these medications   acidophilus Caps capsule   metoCLOPramide 10 MG tablet Commonly known as:  REGLAN   ondansetron 4 MG disintegrating tablet Commonly known as:  ZOFRAN ODT   pantoprazole 40 MG tablet Commonly known as:  PROTONIX     TAKE these medications   albuterol 108 (90 Base) MCG/ACT inhaler Commonly known as:  PROVENTIL HFA;VENTOLIN HFA Inhale 2 puffs into the lungs every 6 (six) hours as needed for wheezing or shortness of breath.   apixaban 5 MG Tabs tablet Commonly known as:  ELIQUIS Take 1 tablet (5 mg total) by mouth 2 (two) times daily.   atorvastatin 40 MG tablet Commonly known as:  LIPITOR Take 1 tablet (40 mg total) by mouth at bedtime.   budesonide-formoterol 160-4.5 MCG/ACT inhaler Commonly known as:  SYMBICORT Inhale 2 puffs into the lungs 2 (two) times daily.   cyclobenzaprine 10 MG tablet Commonly known as:  FLEXERIL Take 10 mg by mouth 3 (three) times daily as needed for muscle spasms.   insulin aspart 100 UNIT/ML FlexPen Commonly known as:  NOVOLOG FLEXPEN Inject 5 units into the skin with meals for blood sugars above 130.   Insulin Detemir 100 UNIT/ML  Pen Commonly known as:  LEVEMIR FLEXPEN Inject 12 Units into the skin every evening.   levothyroxine 75 MCG tablet Commonly known as:  SYNTHROID, LEVOTHROID Take 1 tablet (75 mcg total) by mouth daily before breakfast.   lisinopril 5 MG tablet Commonly known as:  PRINIVIL,ZESTRIL Take 1 tablet by mouth every morning for kidney protection against diabetes.   metoprolol tartrate 25 MG tablet Commonly known as:  LOPRESSOR Take 1 tablet (25 mg total) by mouth 2 (two) times daily.   tiZANidine 4 MG tablet Commonly known as:  ZANAFLEX Take 1 tablet (4 mg total) by mouth 3 (three) times daily as needed for muscle spasms.   XTAMPZA ER 9 MG C12a Generic drug:  oxyCODONE ER Take 9 mg by mouth every 8 (eight) hours.         DISCHARGE INSTRUCTIONS:  DIET:  Cardiac diet and Diabetic diet  DISCHARGE CONDITION:  Stable  ACTIVITY:  Activity as tolerated  OXYGEN:  Home Oxygen: No.   Oxygen Delivery: room air  DISCHARGE LOCATION:  home   If you experience worsening of your admission symptoms, develop shortness of breath, life threatening emergency, suicidal or homicidal thoughts you must seek medical attention immediately by calling 911 or calling your MD immediately  if symptoms less severe.  You Must read complete instructions/literature along with all the possible adverse reactions/side effects for all the Medicines you take and that have been prescribed to you. Take any new Medicines after you have completely understood and accpet all the possible adverse reactions/side effects.   Please note  You were cared for by a hospitalist during your hospital stay. If you have any questions about your discharge medications or the care you received while you were in the hospital after you are discharged, you can call the unit and asked to speak with the hospitalist on call if the hospitalist that took care of you is not available. Once you are discharged, your primary care physician  will handle any further medical issues. Please note that NO REFILLS for any discharge medications will be authorized once you are discharged, as it is imperative that you return to your primary care physician (or establish a relationship with a primary care physician if you do not have one) for your aftercare needs so that they can reassess your need for medications and monitor your lab values.     Today   Mental status improved since admission.  Patient was somewhat delirious yesterday but more awake and alert today.  Still needs to complain of chronic pain issues.  Potassium level normal, sodium has normalized.  Blood sugar stable.  Patient will be discharged home today.  VITAL SIGNS:  Blood pressure (!) 159/68, pulse (!) 107, temperature 99.4 F (37.4 C), temperature source Oral, resp. rate 18, height 5' 3"  (1.6 m), weight 64.9 kg (143 lb), SpO2 100 %.  I/O:    Intake/Output Summary (Last 24 hours) at 12/17/2017 1405 Last data filed at 12/17/2017 1300 Gross per 24 hour  Intake 360 ml  Output -  Net 360 ml    PHYSICAL EXAMINATION:   GENERAL:  62 y.o.-year-old patient lying in bed more awake and alert but in NAD.  EYES: Pupils equal, round, reactive to light and accommodation. No scleral icterus. Extraocular muscles intact.  HEENT: Head atraumatic, normocephalic. Oropharynx and nasopharynx clear.  NECK:  Supple, no jugular venous distention. No thyroid enlargement, no tenderness.  LUNGS: Normal breath sounds bilaterally, no wheezing, rales, rhonchi. No use of accessory muscles of respiration.  CARDIOVASCULAR: S1, S2 normal. No murmurs, rubs, or gallops.  ABDOMEN: Soft, nontender, nondistended. Bowel sounds present. No organomegaly or mass.  EXTREMITIES: No cyanosis, clubbing or edema b/l.    NEUROLOGIC: Cranial nerves II through XII are intact. No focal Motor or sensory deficits b/l. Globally weak.  PSYCHIATRIC: The patient is alert and oriented x 2.  SKIN: No obvious rash, lesion,  or ulcer.    DATA REVIEW:   CBC Recent Labs  Lab 12/16/17 0500  WBC 9.1  HGB 10.6*  HCT 33.9*  PLT 208    Chemistries  Recent Labs  Lab 12/14/17 2343 12/15/17 0952  12/17/17 0937  NA 120* 131*   < > 133*  K 2.4* 2.8*   < > 3.8  CL 86* 101   < > 108  CO2 21* 21*   < >  19*  GLUCOSE 984* 395*   < > 195*  BUN 14 12   < > 12  CREATININE 1.86* 1.29*   < > 1.23*  CALCIUM 7.9* 7.3*   < > 7.5*  MG  --  1.7  --   --   AST 30  --   --   --   ALT 26  --   --   --   ALKPHOS 176*  --   --   --   BILITOT 1.0  --   --   --    < > = values in this interval not displayed.    Cardiac Enzymes Recent Labs  Lab 12/14/17 2343  TROPONINI 0.07*    Microbiology Results  Results for orders placed or performed during the hospital encounter of 12/14/17  Urine culture     Status: Abnormal   Collection Time: 12/14/17 11:43 PM  Result Value Ref Range Status   Specimen Description   Final    URINE, CATHETERIZED Performed at [facility], [address] [place], [place] [address]    Special Requests   Final    NONE Performed at [facility], [address] [place], [place] [address]    Culture 60,000 COLONIES/mL YEAST (A)  Final   Report Status 12/16/2017 FINAL  Final  Culture, blood (routine x 2)     Status: None (Preliminary result)   Collection Time: 12/14/17 11:57 PM  Result Value Ref Range Status   Specimen Description BLOOD BLOOD RIGHT ARM  Final   Special Requests   Final    BOTTLES DRAWN AEROBIC AND ANAEROBIC Blood Culture adequate volume   Culture   Final    NO GROWTH 2 DAYS Performed at [facility], [address] [place], [place] [address]    Report Status PENDING  Incomplete  Culture, blood (routine x 2)     Status: Abnormal (Preliminary result)   Collection Time: 12/15/17 12:25 AM  Result Value Ref Range Status   Specimen Description   Final    BLOOD RIGHT HAND Performed at [facility], [address],  [place], [place] [address]    Special Requests   Final    BOTTLES DRAWN AEROBIC AND ANAEROBIC Blood Culture adequate volume Performed at [facility], [address] [place], [place] [address]    Culture  Setup Time   Final    Organism ID to follow [place] TO, READ BACK BY AND VERIFIED WITH: SCOTT CHRISTY AT 2046 AT 12/15/2017 JJB GRAM STAIN REVIEWED-AGREE WITH RESULT    Culture (A)  Final    STAPHYLOCOCCUS SPECIES (COAGULASE NEGATIVE) THE SIGNIFICANCE OF ISOLATING THIS ORGANISM FROM A SINGLE SET OF BLOOD CULTURES WHEN MULTIPLE SETS ARE DRAWN IS UNCERTAIN. PLEASE NOTIFY THE MICROBIOLOGY DEPARTMENT WITHIN ONE WEEK IF SPECIATION AND SENSITIVITIES ARE REQUIRED. Performed at [facility], [place] [address] [place],  [address]    Report Status PENDING  Incomplete  Blood Culture ID Panel (Reflexed)     Status: Abnormal   Collection Time: 12/15/17 12:25 AM  Result Value Ref Range Status   Enterococcus species NOT DETECTED NOT DETECTED Final   Listeria monocytogenes NOT DETECTED NOT DETECTED Final   Staphylococcus species DETECTED (A) NOT DETECTED Final    Comment: Methicillin (oxacillin) susceptible coagulase negative staphylococcus. Possible blood culture contaminant (unless isolated from more than one blood culture draw or clinical case suggests pathogenicity). No antibiotic treatment is  indicated for blood  culture contaminants. CRITICAL RESULT CALLED TO, READ BACK BY AND VERIFIED WITH: SCOTT CHRISTY AT 2046 ON 12/15/2017 JJB    Staphylococcus aureus NOT DETECTED NOT DETECTED Final   Methicillin resistance NOT DETECTED NOT DETECTED Final   Streptococcus species NOT DETECTED NOT DETECTED Final   Streptococcus agalactiae NOT DETECTED NOT DETECTED Final   Streptococcus pneumoniae NOT DETECTED NOT DETECTED Final   Streptococcus pyogenes NOT DETECTED NOT DETECTED Final   Acinetobacter baumannii NOT DETECTED NOT DETECTED  Final   Enterobacteriaceae species NOT DETECTED NOT DETECTED Final   Enterobacter cloacae complex NOT DETECTED NOT DETECTED Final   Escherichia coli NOT DETECTED NOT DETECTED Final   Klebsiella oxytoca NOT DETECTED NOT DETECTED Final   Klebsiella pneumoniae NOT DETECTED NOT DETECTED Final   Proteus species NOT DETECTED NOT DETECTED Final   Serratia marcescens NOT DETECTED NOT DETECTED Final   Haemophilus influenzae NOT DETECTED NOT DETECTED Final   Neisseria meningitidis NOT DETECTED NOT DETECTED Final   Pseudomonas aeruginosa NOT DETECTED NOT DETECTED Final   Candida albicans NOT DETECTED NOT DETECTED Final   Candida glabrata NOT DETECTED NOT DETECTED Final   Candida krusei NOT DETECTED NOT DETECTED Final   Candida parapsilosis NOT DETECTED NOT DETECTED Final   Candida tropicalis NOT DETECTED NOT DETECTED Final    Comment: Performed at [facility], [address] [place], [place] [address]    RADIOLOGY:  No results found.    Management plans discussed with the patient, family and they are in agreement.  CODE STATUS:     Code Status Orders  (From admission, onward)        Start     Ordered   12/15/17 0502  Full code  Continuous     12/15/17 0501        TOTAL TIME TAKING CARE OF THIS PATIENT: 40 minutes.    Henreitta Leber M.D on 12/17/2017 at 2:05 PM  Between 7am to 6pm - Pager - [phone]  After 6pm go to [email or web address] - Proofreader  [facility] [place] [facility]  Office  [phone]  CC: Primary care physician; Patient, No Pcp Per

## 2017-12-17 NOTE — Progress Notes (Signed)
Pt remains confused, but, is more alert than previous shift.  No acute distress noted this shift 7p-111a. Will continue to monitor pt

## 2017-12-17 NOTE — Evaluation (Signed)
Physical Therapy Evaluation Patient Details Name: [name] MRN: [id number] DOB: 1956-04-08 Today's Date: 12/17/2017   History of Present Illness  62 y/o female here with UTI/sepsis.  Arrived at hospital with severely elevated blood sugars, hypocalcemia.    Clinical Impression  Pt did relatively well with PT exam and was able to tolerate ~10 minutes of limited gait training with slow but ultimately safe ambulation.  She had some limited confusion t/o the exam, but was able to participate appropriately.  Overall did well and though she showed some limitations she should be able to return home with HHPT.    Follow Up Recommendations Home health PT    Equipment Recommendations  None recommended by PT    Recommendations for Other Services       Precautions / Restrictions Precautions Precautions: Fall Restrictions Weight Bearing Restrictions: No      Mobility  Bed Mobility Overal bed mobility: Modified Independent             General bed mobility comments: Pt able to get to EOB w/o direct assist  Transfers Overall transfer level: Modified independent Equipment used: Rolling walker (2 wheeled)             General transfer comment: cuing for set up, etc able to rise w/o assist  Ambulation/Gait Ambulation/Gait assistance: Modified independent (Device/Increase time) Ambulation Distance (Feet): 45 Feet Assistive device: Rolling walker (2 wheeled)       General Gait Details: Pt with slow, but safe ambulation. Fatigued with this minimal effort, but appropriate for in home distance  Stairs            Wheelchair Mobility    Modified Rankin (Stroke Patients Only)       Balance Overall balance assessment: Modified Independent                                           Pertinent Vitals/Pain Pain Assessment: (chronic back pain that is worse than baseline)    Home Living Family/patient expects to be discharged to:: Private  residence Living Arrangements: Spouse/significant other;Children Available Help at Discharge: Family Type of Home: Mobile home Home Access: Ramped entrance       Home Equipment: Environmental consultant - 2 wheels      Prior Function Level of Independence: Independent with assistive device(s)         Comments: Pt reports that she does not get out for much more than MD appointments, minimally active     Hand Dominance        Extremity/Trunk Assessment   Upper Extremity Assessment Upper Extremity Assessment: Generalized weakness    Lower Extremity Assessment Lower Extremity Assessment: Generalized weakness       Communication   Communication: No difficulties  Cognition Arousal/Alertness: Awake/alert Behavior During Therapy: WFL for tasks assessed/performed Overall Cognitive Status: Impaired/Different from baseline                                 General Comments: unsure of baseline, but pt pleasantly confsed, doesn't know date, location      General Comments      Exercises     Assessment/Plan    PT Assessment Patient needs continued PT services  PT Problem List Decreased strength;Decreased range of motion;Decreased activity tolerance;Decreased balance;Decreased mobility;Decreased cognition;Decreased knowledge of use of DME;Decreased safety awareness  PT Treatment Interventions DME instruction;Gait training;Functional mobility training;Therapeutic exercise;Therapeutic activities;Balance training;Neuromuscular re-education;Cognitive remediation;Patient/family education    PT Goals (Current goals can be found in the Care Plan section)  Acute Rehab PT Goals Patient Stated Goal: go home PT Goal Formulation: With patient Time For Goal Achievement: 12/31/17 Potential to Achieve Goals: Good    Frequency Min 2X/week   Barriers to discharge        Co-evaluation               AM-PAC PT "6 Clicks" Daily Activity  Outcome Measure Difficulty turning  over in bed (including adjusting bedclothes, sheets and blankets)?: A Little Difficulty moving from lying on back to sitting on the side of the bed? : A Little Difficulty sitting down on and standing up from a chair with arms (e.g., wheelchair, bedside commode, etc,.)?: A Little Help needed moving to and from a bed to chair (including a wheelchair)?: A Little Help needed walking in hospital room?: A Little Help needed climbing 3-5 steps with a railing? : A Lot 6 Click Score: 17    End of Session Equipment Utilized During Treatment: Gait belt Activity Tolerance: Patient tolerated treatment well;Patient limited by fatigue Patient left: with call bell/phone within reach;with chair alarm set Nurse Communication: Mobility status PT Visit Diagnosis: Muscle weakness (generalized) (M62.81);Difficulty in walking, not elsewhere classified (R26.2)    Time: 8333-8329 PT Time Calculation (min) (ACUTE ONLY): 29 min   Charges:   PT Evaluation $PT Eval Low Complexity: 1 Low PT Treatments $Gait Training: 8-22 mins   PT G Codes:        Kreg Shropshire, DPT 12/17/2017, 1:09 PM

## 2017-12-17 NOTE — Progress Notes (Signed)
Discharge instructions reviewed with patient, patient verbalized understanding, Rx's given to patient. IV times two dc'ed and mild pressure dressing applied as patient is currently on blood thinner. Patient educated to inspect bandage and remove at home. Patient verbalized understanding. Patient's neighbor arrived to transport patient home. Escorted to vehicle via wheelchair

## 2017-12-18 ENCOUNTER — Telehealth: Payer: Self-pay

## 2017-12-18 LAB — CULTURE, BLOOD (ROUTINE X 2): SPECIAL REQUESTS: ADEQUATE

## 2017-12-18 IMAGING — DX DG ABDOMEN 1V
1 series · 1 of 1 positions shown · non-contrast
Comparison: 01/22/2016

CLINICAL DATA: Ileus

EXAM:
ABDOMEN - 1 VIEW

[abdomen kub]
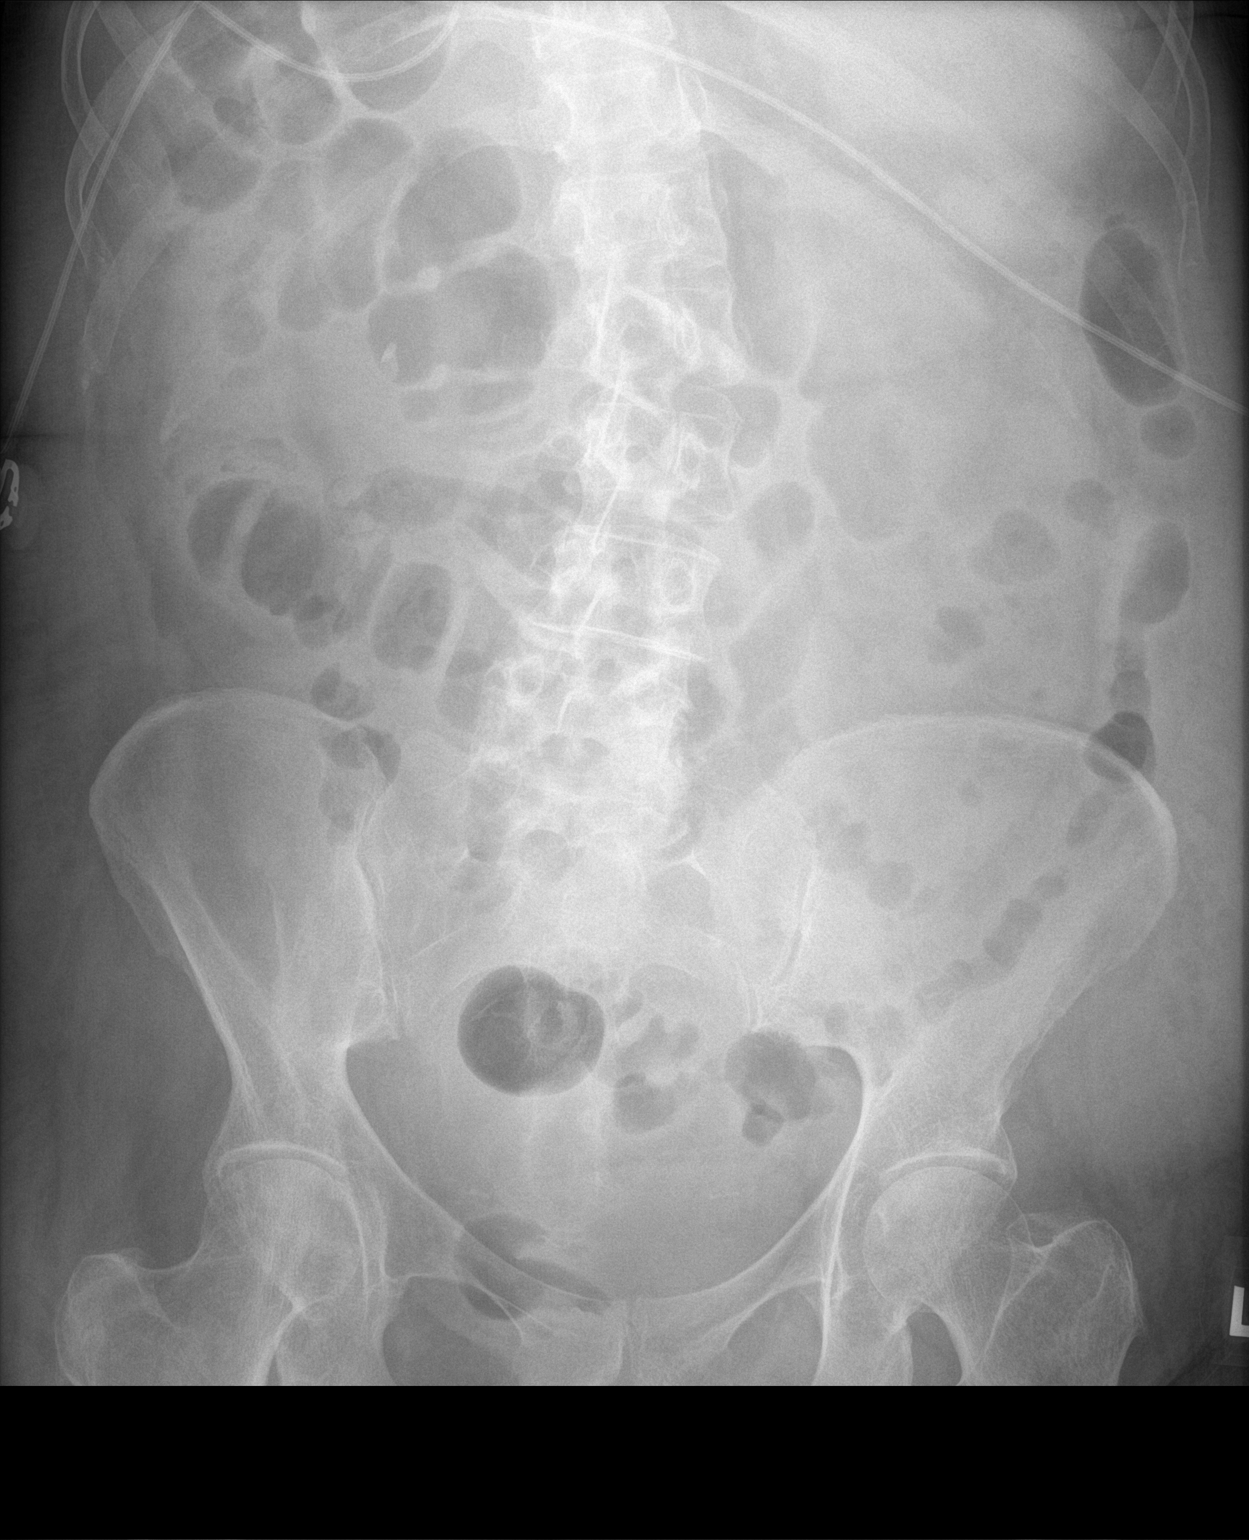

[1 of 1 positions shown; findings below may reference images not displayed]

FINDINGS: There is normal small bowel gas pattern. Mild colonic gas in right
colon and proximal transverse colon. Some colonic gas noted in
descending colon and proximal left colon.
IMPRESSION: Normal small bowel gas pattern.  Mild colonic gas as described above

## 2017-12-18 NOTE — Telephone Encounter (Signed)
Attempted to reach patient for TCM. Attempt unsuccessful. Per individual who answered call, patient was not available.

## 2017-12-18 NOTE — Consult Note (Signed)
   [facility] CM Inpatient Consult   12/18/2017  BEA DUREN [birth year] [phone]   Patient was evaluated for eligibility of [facility] care management services after referral recieved by inpatient diabetes coordinator.  Patient confirmed she does not currently have a [facility] Primary care provider. Chart review and ACO registry reveal patient is ineligible for [facility] Care Management services at this time. For questions please contact:  Harlei Lehrmann RN, [facility] Liaison  [phone]) Business Mobile [phone]) Toll free office

## 2017-12-19 NOTE — Telephone Encounter (Signed)
Lm requesting return call to complete TCM and confirm hosp f/u appt

## 2017-12-20 LAB — CULTURE, BLOOD (ROUTINE X 2)
Culture: NO GROWTH
Special Requests: ADEQUATE

## 2017-12-21 IMAGING — CR DG CHEST 2V
1 series · 2 of 2 positions shown · non-contrast
Comparison: Chest radiograph performed 01/21/2016

CLINICAL DATA: Acute onset of generalized chest pain, shortness of
breath and cough. Initial encounter.

EXAM:
CHEST  2 VIEW

[Series 1: dg chest 2 view · 0.14mm/px · 2 of 2 slices shown]
[im 1/2]
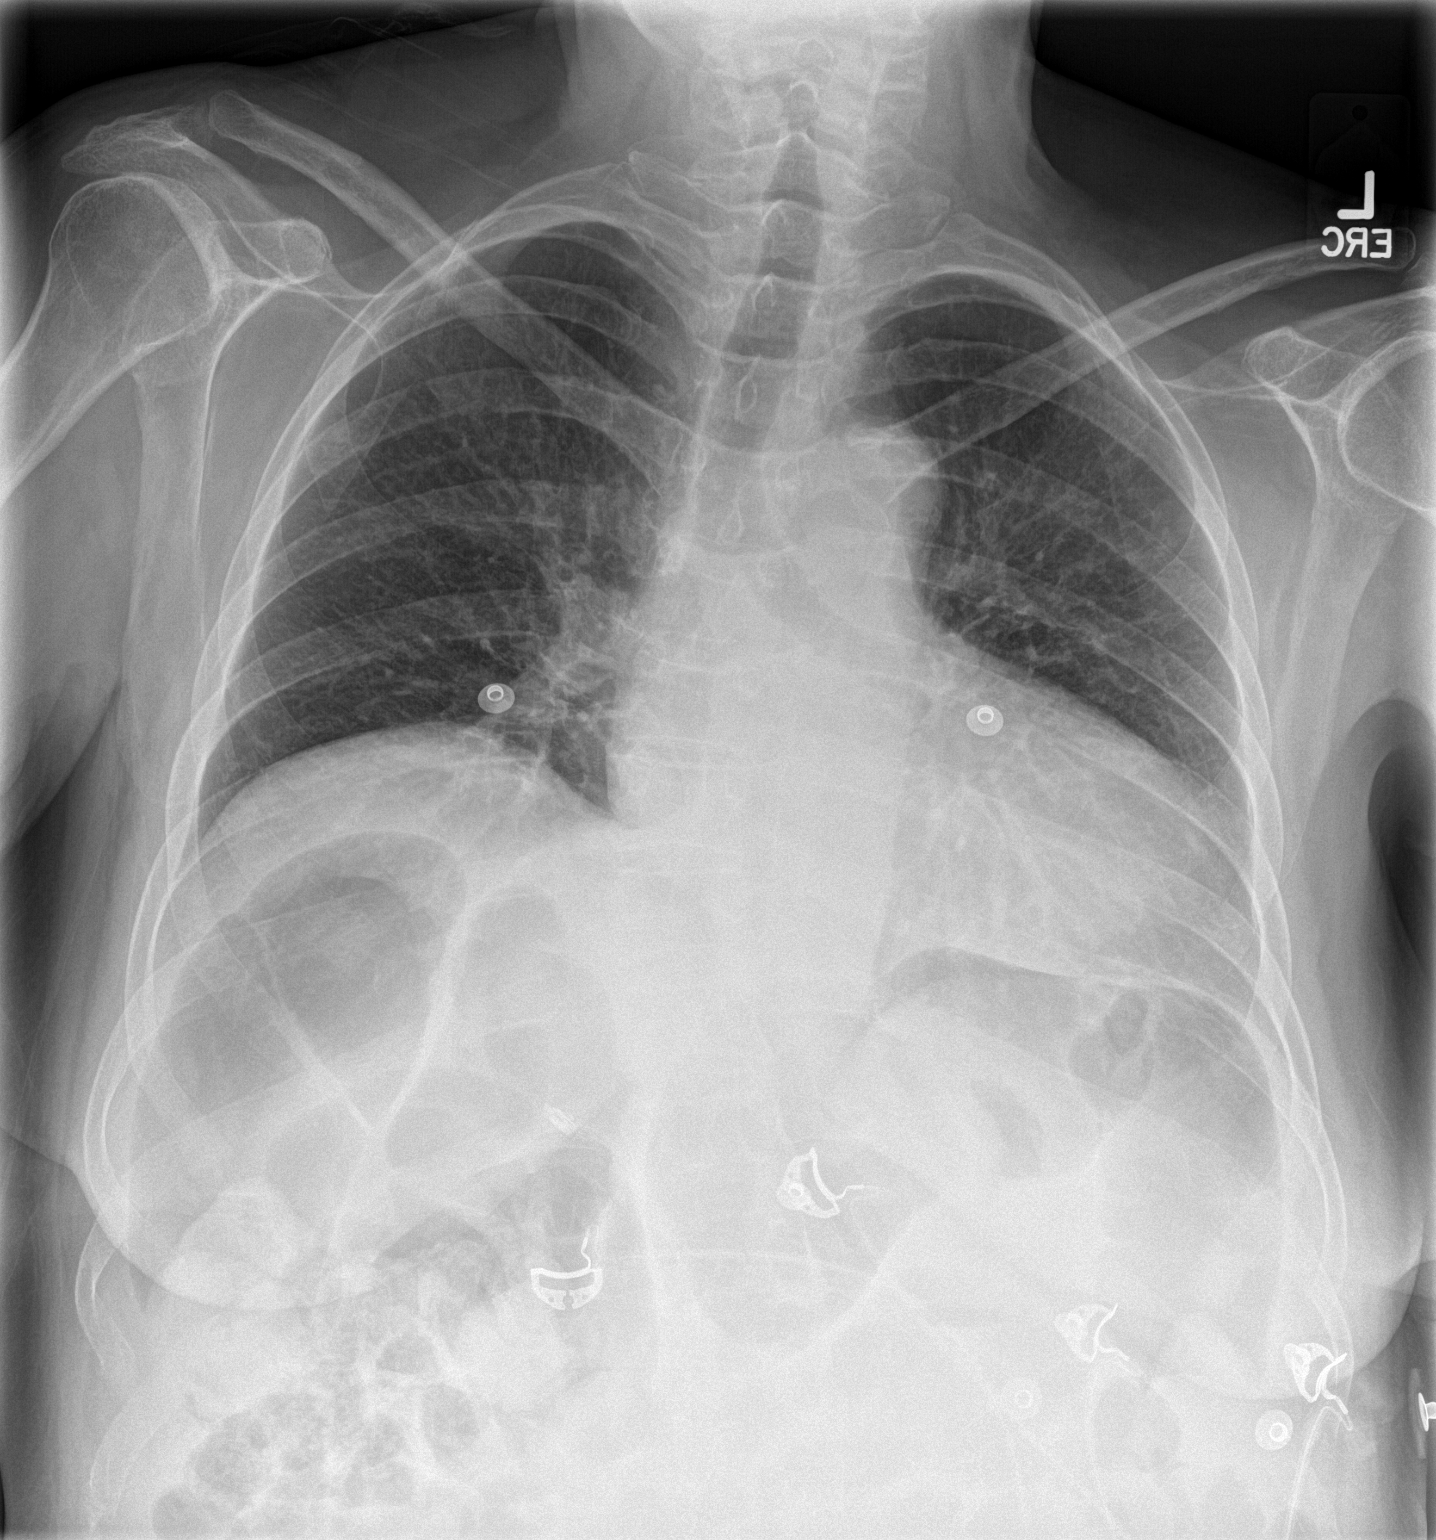
[im 2/2]
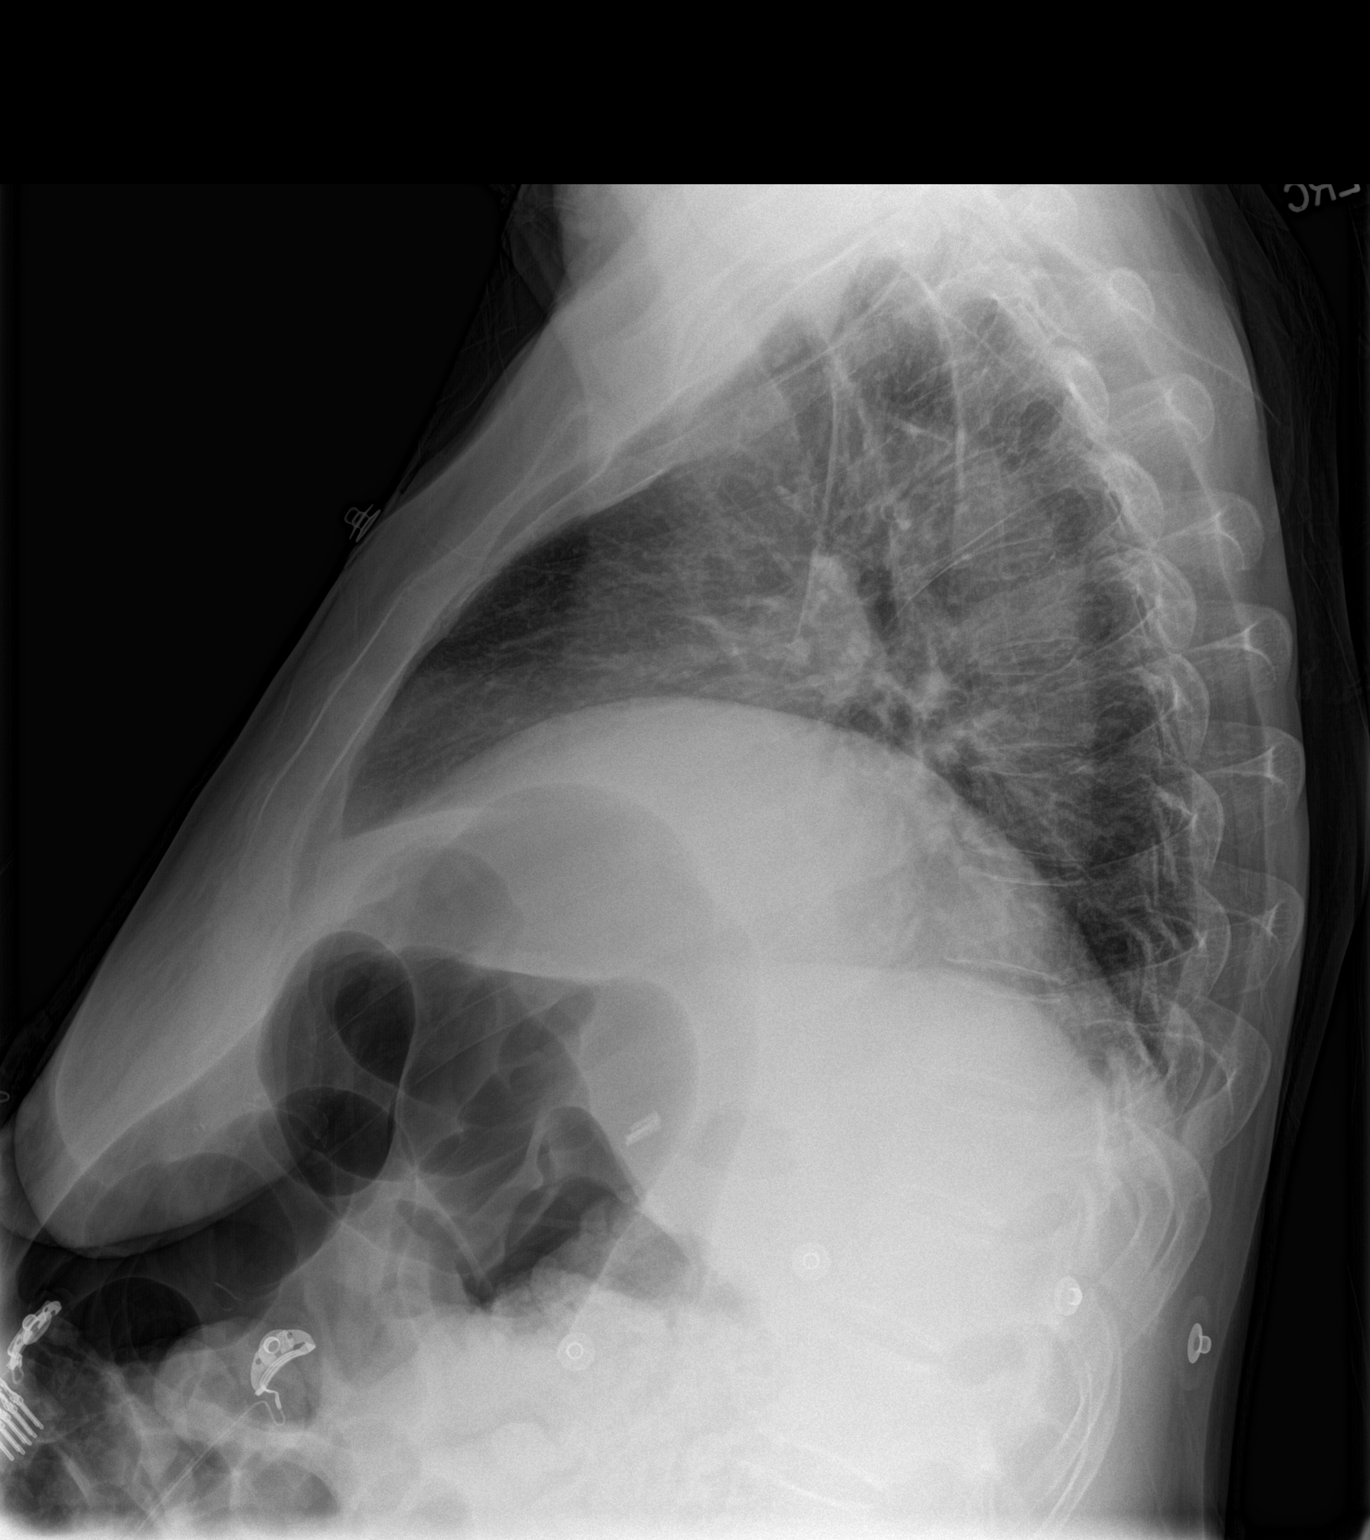

[2 of 2 positions shown; findings below may reference images not displayed]

FINDINGS: The lungs are mildly hypoexpanded. There is mild elevation of the
right hemidiaphragm. There is no evidence of focal opacification,
pleural effusion or pneumothorax.

The heart is borderline enlarged. No acute osseous abnormalities are
seen. Clips are noted within the right upper quadrant, reflecting
prior cholecystectomy.
IMPRESSION: Lungs mildly hypoexpanded, with mild elevation of the right
hemidiaphragm. Borderline cardiomegaly.

## 2017-12-23 DIAGNOSIS — M5412 Radiculopathy, cervical region: Secondary | ICD-10-CM | POA: Diagnosis not present

## 2017-12-23 DIAGNOSIS — M545 Low back pain: Secondary | ICD-10-CM | POA: Diagnosis not present

## 2017-12-23 DIAGNOSIS — G894 Chronic pain syndrome: Secondary | ICD-10-CM | POA: Diagnosis not present

## 2017-12-23 DIAGNOSIS — I10 Essential (primary) hypertension: Secondary | ICD-10-CM | POA: Diagnosis not present

## 2017-12-23 DIAGNOSIS — Z79899 Other long term (current) drug therapy: Secondary | ICD-10-CM | POA: Diagnosis not present

## 2017-12-23 DIAGNOSIS — F1120 Opioid dependence, uncomplicated: Secondary | ICD-10-CM | POA: Diagnosis not present

## 2017-12-23 DIAGNOSIS — M5416 Radiculopathy, lumbar region: Secondary | ICD-10-CM | POA: Diagnosis not present

## 2017-12-23 DIAGNOSIS — M542 Cervicalgia: Secondary | ICD-10-CM | POA: Diagnosis not present

## 2017-12-24 NOTE — Telephone Encounter (Signed)
Spoke with patient.  She is happy to set up appointment, states she is having some ongoing confusion and doesn't feel that she has fully recovered since hospital follow up. No current questions, will discuss concerns with [name] next week at appt.    Patient has 3mn hospital follow up scheduled with KAllie Bossier NP for 12/31/17 at 9:15am.

## 2017-12-24 NOTE — Telephone Encounter (Signed)
Noted  

## 2017-12-27 ENCOUNTER — Other Ambulatory Visit: Payer: Self-pay

## 2017-12-27 ENCOUNTER — Encounter: Payer: Self-pay | Admitting: *Deleted

## 2017-12-27 ENCOUNTER — Inpatient Hospital Stay
Admission: EM | Admit: 2017-12-27 | Discharge: 2017-12-30 | DRG: 378 | Disposition: A | Discharge status: Home Health | Payer: Medicare HMO | Attending: Internal Medicine | Admitting: Internal Medicine

## 2017-12-27 DIAGNOSIS — N183 Chronic kidney disease, stage 3 (moderate): Secondary | ICD-10-CM | POA: Diagnosis present

## 2017-12-27 DIAGNOSIS — N179 Acute kidney failure, unspecified: Secondary | ICD-10-CM | POA: Diagnosis present

## 2017-12-27 DIAGNOSIS — K7290 Hepatic failure, unspecified without coma: Secondary | ICD-10-CM | POA: Diagnosis not present

## 2017-12-27 DIAGNOSIS — K209 Esophagitis, unspecified: Secondary | ICD-10-CM | POA: Diagnosis not present

## 2017-12-27 DIAGNOSIS — R05 Cough: Secondary | ICD-10-CM

## 2017-12-27 DIAGNOSIS — K254 Chronic or unspecified gastric ulcer with hemorrhage: Principal | ICD-10-CM | POA: Diagnosis present

## 2017-12-27 DIAGNOSIS — D696 Thrombocytopenia, unspecified: Secondary | ICD-10-CM | POA: Diagnosis present

## 2017-12-27 DIAGNOSIS — K219 Gastro-esophageal reflux disease without esophagitis: Secondary | ICD-10-CM | POA: Diagnosis present

## 2017-12-27 DIAGNOSIS — K3189 Other diseases of stomach and duodenum: Secondary | ICD-10-CM | POA: Diagnosis not present

## 2017-12-27 DIAGNOSIS — Z79899 Other long term (current) drug therapy: Secondary | ICD-10-CM

## 2017-12-27 DIAGNOSIS — I129 Hypertensive chronic kidney disease with stage 1 through stage 4 chronic kidney disease, or unspecified chronic kidney disease: Secondary | ICD-10-CM | POA: Diagnosis present

## 2017-12-27 DIAGNOSIS — K2210 Ulcer of esophagus without bleeding: Secondary | ICD-10-CM | POA: Diagnosis not present

## 2017-12-27 DIAGNOSIS — K259 Gastric ulcer, unspecified as acute or chronic, without hemorrhage or perforation: Secondary | ICD-10-CM | POA: Diagnosis not present

## 2017-12-27 DIAGNOSIS — Z8542 Personal history of malignant neoplasm of other parts of uterus: Secondary | ICD-10-CM | POA: Diagnosis not present

## 2017-12-27 DIAGNOSIS — I69354 Hemiplegia and hemiparesis following cerebral infarction affecting left non-dominant side: Secondary | ICD-10-CM

## 2017-12-27 DIAGNOSIS — Z9114 Patient's other noncompliance with medication regimen: Secondary | ICD-10-CM

## 2017-12-27 DIAGNOSIS — R2681 Unsteadiness on feet: Secondary | ICD-10-CM | POA: Diagnosis not present

## 2017-12-27 DIAGNOSIS — E039 Hypothyroidism, unspecified: Secondary | ICD-10-CM | POA: Diagnosis present

## 2017-12-27 DIAGNOSIS — I10 Essential (primary) hypertension: Secondary | ICD-10-CM | POA: Diagnosis not present

## 2017-12-27 DIAGNOSIS — K922 Gastrointestinal hemorrhage, unspecified: Secondary | ICD-10-CM | POA: Diagnosis present

## 2017-12-27 DIAGNOSIS — J9811 Atelectasis: Secondary | ICD-10-CM | POA: Diagnosis not present

## 2017-12-27 DIAGNOSIS — K7581 Nonalcoholic steatohepatitis (NASH): Secondary | ICD-10-CM

## 2017-12-27 DIAGNOSIS — K766 Portal hypertension: Secondary | ICD-10-CM | POA: Diagnosis present

## 2017-12-27 DIAGNOSIS — E1022 Type 1 diabetes mellitus with diabetic chronic kidney disease: Secondary | ICD-10-CM | POA: Diagnosis present

## 2017-12-27 DIAGNOSIS — E8770 Fluid overload, unspecified: Secondary | ICD-10-CM | POA: Diagnosis present

## 2017-12-27 DIAGNOSIS — I480 Paroxysmal atrial fibrillation: Secondary | ICD-10-CM | POA: Diagnosis present

## 2017-12-27 DIAGNOSIS — G459 Transient cerebral ischemic attack, unspecified: Secondary | ICD-10-CM | POA: Diagnosis not present

## 2017-12-27 DIAGNOSIS — G8194 Hemiplegia, unspecified affecting left nondominant side: Secondary | ICD-10-CM | POA: Diagnosis not present

## 2017-12-27 DIAGNOSIS — I252 Old myocardial infarction: Secondary | ICD-10-CM

## 2017-12-27 DIAGNOSIS — D689 Coagulation defect, unspecified: Secondary | ICD-10-CM | POA: Diagnosis present

## 2017-12-27 DIAGNOSIS — E119 Type 2 diabetes mellitus without complications: Secondary | ICD-10-CM | POA: Diagnosis not present

## 2017-12-27 DIAGNOSIS — Z87891 Personal history of nicotine dependence: Secondary | ICD-10-CM | POA: Diagnosis not present

## 2017-12-27 DIAGNOSIS — K7460 Unspecified cirrhosis of liver: Secondary | ICD-10-CM | POA: Diagnosis present

## 2017-12-27 DIAGNOSIS — B3781 Candidal esophagitis: Secondary | ICD-10-CM | POA: Diagnosis present

## 2017-12-27 DIAGNOSIS — E1065 Type 1 diabetes mellitus with hyperglycemia: Secondary | ICD-10-CM | POA: Diagnosis present

## 2017-12-27 DIAGNOSIS — R079 Chest pain, unspecified: Secondary | ICD-10-CM | POA: Diagnosis not present

## 2017-12-27 DIAGNOSIS — D649 Anemia, unspecified: Secondary | ICD-10-CM | POA: Diagnosis not present

## 2017-12-27 DIAGNOSIS — I639 Cerebral infarction, unspecified: Secondary | ICD-10-CM

## 2017-12-27 DIAGNOSIS — R188 Other ascites: Secondary | ICD-10-CM | POA: Diagnosis present

## 2017-12-27 DIAGNOSIS — E860 Dehydration: Secondary | ICD-10-CM | POA: Diagnosis present

## 2017-12-27 DIAGNOSIS — Z7901 Long term (current) use of anticoagulants: Secondary | ICD-10-CM

## 2017-12-27 DIAGNOSIS — K2960 Other gastritis without bleeding: Secondary | ICD-10-CM | POA: Diagnosis present

## 2017-12-27 DIAGNOSIS — R404 Transient alteration of awareness: Secondary | ICD-10-CM | POA: Diagnosis not present

## 2017-12-27 DIAGNOSIS — K2970 Gastritis, unspecified, without bleeding: Secondary | ICD-10-CM | POA: Diagnosis not present

## 2017-12-27 DIAGNOSIS — R000 Tachycardia, unspecified: Secondary | ICD-10-CM | POA: Diagnosis not present

## 2017-12-27 DIAGNOSIS — I6789 Other cerebrovascular disease: Secondary | ICD-10-CM | POA: Diagnosis not present

## 2017-12-27 DIAGNOSIS — R809 Proteinuria, unspecified: Secondary | ICD-10-CM

## 2017-12-27 DIAGNOSIS — K921 Melena: Secondary | ICD-10-CM | POA: Diagnosis not present

## 2017-12-27 DIAGNOSIS — Y658 Other specified misadventures during surgical and medical care: Secondary | ICD-10-CM | POA: Diagnosis not present

## 2017-12-27 DIAGNOSIS — R739 Hyperglycemia, unspecified: Secondary | ICD-10-CM | POA: Diagnosis not present

## 2017-12-27 DIAGNOSIS — Z794 Long term (current) use of insulin: Secondary | ICD-10-CM | POA: Diagnosis not present

## 2017-12-27 DIAGNOSIS — K9171 Accidental puncture and laceration of a digestive system organ or structure during a digestive system procedure: Secondary | ICD-10-CM | POA: Diagnosis not present

## 2017-12-27 DIAGNOSIS — S1121XA Laceration without foreign body of pharynx and cervical esophagus, initial encounter: Secondary | ICD-10-CM

## 2017-12-27 DIAGNOSIS — R059 Cough, unspecified: Secondary | ICD-10-CM

## 2017-12-27 DIAGNOSIS — E1129 Type 2 diabetes mellitus with other diabetic kidney complication: Secondary | ICD-10-CM

## 2017-12-27 DIAGNOSIS — K279 Peptic ulcer, site unspecified, unspecified as acute or chronic, without hemorrhage or perforation: Secondary | ICD-10-CM | POA: Diagnosis not present

## 2017-12-27 DIAGNOSIS — I6529 Occlusion and stenosis of unspecified carotid artery: Secondary | ICD-10-CM | POA: Diagnosis not present

## 2017-12-27 DIAGNOSIS — R1084 Generalized abdominal pain: Secondary | ICD-10-CM | POA: Diagnosis not present

## 2017-12-27 DIAGNOSIS — S1121XD Laceration without foreign body of pharynx and cervical esophagus, subsequent encounter: Secondary | ICD-10-CM | POA: Diagnosis not present

## 2017-12-27 DIAGNOSIS — R531 Weakness: Secondary | ICD-10-CM | POA: Diagnosis not present

## 2017-12-27 DIAGNOSIS — D638 Anemia in other chronic diseases classified elsewhere: Secondary | ICD-10-CM | POA: Diagnosis present

## 2017-12-27 LAB — BASIC METABOLIC PANEL
ANION GAP: 8 (ref 5–15)
BUN: 14 mg/dL (ref 6–20)
CALCIUM: 7.9 mg/dL — AB (ref 8.9–10.3)
CO2: 22 mmol/L (ref 22–32)
CREATININE: 1.37 mg/dL — AB (ref 0.44–1.00)
Chloride: 101 mmol/L (ref 101–111)
GFR calc Af Amer: 46 mL/min — ABNORMAL LOW (ref >60)
GFR calc non Af Amer: 40 mL/min — ABNORMAL LOW (ref >60)
GLUCOSE: 456 mg/dL — AB (ref 65–99)
Potassium: 3.8 mmol/L (ref 3.5–5.1)
Sodium: 131 mmol/L — ABNORMAL LOW (ref 135–145)

## 2017-12-27 LAB — CBC
HCT: 27.4 % — ABNORMAL LOW (ref 35.0–47.0)
Hemoglobin: 8.7 g/dL — ABNORMAL LOW (ref 12.0–16.0)
MCH: 25.6 pg — AB (ref 26.0–34.0)
MCHC: 31.8 g/dL — AB (ref 32.0–36.0)
MCV: 80.4 fL (ref 80.0–100.0)
Platelets: 131 10*3/uL — ABNORMAL LOW (ref 150–440)
RBC: 3.41 MIL/uL — ABNORMAL LOW (ref 3.80–5.20)
RDW: 20.1 % — AB (ref 11.5–14.5)
WBC: 5.8 10*3/uL (ref 3.6–11.0)

## 2017-12-27 LAB — HEPATIC FUNCTION PANEL
ALBUMIN: 2 g/dL — AB (ref 3.5–5.0)
ALK PHOS: 128 U/L — AB (ref 38–126)
ALT: 18 U/L (ref 14–54)
AST: 21 U/L (ref 15–41)
Bilirubin, Direct: 0.2 mg/dL (ref 0.1–0.5)
Indirect Bilirubin: 0.3 mg/dL (ref 0.3–0.9)
TOTAL PROTEIN: 5.7 g/dL — AB (ref 6.5–8.1)
Total Bilirubin: 0.5 mg/dL (ref 0.3–1.2)

## 2017-12-27 LAB — URINALYSIS, COMPLETE (UACMP) WITH MICROSCOPIC
Bilirubin Urine: NEGATIVE
Glucose, UA: >=500 mg/dL — AB
KETONES UR: 5 mg/dL — AB
Nitrite: NEGATIVE
PH: 5 (ref 5.0–8.0)
PROTEIN: 30 mg/dL — AB
Specific Gravity, Urine: 1.024 (ref 1.005–1.030)

## 2017-12-27 LAB — IRON AND TIBC
IRON: 10 ug/dL — AB (ref 28–170)
Saturation Ratios: 5 % — ABNORMAL LOW (ref 10.4–31.8)
TIBC: 185 ug/dL — AB (ref 250–450)
UIBC: 175 ug/dL

## 2017-12-27 LAB — GLUCOSE, CAPILLARY
GLUCOSE-CAPILLARY: 339 mg/dL — AB (ref 65–99)
GLUCOSE-CAPILLARY: 256 mg/dL — AB (ref 65–99)
Glucose-Capillary: 421 mg/dL — ABNORMAL HIGH (ref 65–99)
Glucose-Capillary: 149 mg/dL — ABNORMAL HIGH (ref 65–99)

## 2017-12-27 LAB — PROTIME-INR
INR: 1.37
Prothrombin Time: 16.8 seconds — ABNORMAL HIGH (ref 11.4–15.2)

## 2017-12-27 LAB — FERRITIN: FERRITIN: 96 ng/mL (ref 11–307)

## 2017-12-27 MED ORDER — INSULIN DETEMIR 100 UNIT/ML ~~LOC~~ SOLN
12.0000 [IU] | Freq: Every day | SUBCUTANEOUS | Status: DC
  Administered 2017-12-27 – 2017-12-29 (×3): 12 [IU] via SUBCUTANEOUS
  Filled 2017-12-27 (×4): qty 0.12

## 2017-12-27 MED ORDER — RIFAXIMIN 550 MG PO TABS
550.0000 mg | ORAL_TABLET | Freq: Two times a day (BID) | ORAL | Status: DC
  Administered 2017-12-27 – 2017-12-30 (×4): 550 mg via ORAL
  Filled 2017-12-27 (×5): qty 1

## 2017-12-27 MED ORDER — OCTREOTIDE LOAD VIA INFUSION
50.0000 ug | Freq: Once | INTRAVENOUS | Status: AC
  Administered 2017-12-27: 50 ug via INTRAVENOUS
  Filled 2017-12-27: qty 25

## 2017-12-27 MED ORDER — INSULIN ASPART 100 UNIT/ML ~~LOC~~ SOLN
10.0000 [IU] | Freq: Once | SUBCUTANEOUS | Status: AC
  Administered 2017-12-27: 10 [IU] via INTRAVENOUS
  Filled 2017-12-27: qty 1

## 2017-12-27 MED ORDER — INSULIN ASPART 100 UNIT/ML ~~LOC~~ SOLN
0.0000 [IU] | Freq: Three times a day (TID) | SUBCUTANEOUS | Status: DC
  Administered 2017-12-27: 7 [IU] via SUBCUTANEOUS
  Administered 2017-12-30: 3 [IU] via SUBCUTANEOUS
  Filled 2017-12-27 (×2): qty 1

## 2017-12-27 MED ORDER — BISACODYL 5 MG PO TBEC: 5.0000 mg | DELAYED_RELEASE_TABLET | Freq: Every day | ORAL | Status: DC | PRN

## 2017-12-27 MED ORDER — FAMOTIDINE IN NACL 20-0.9 MG/50ML-% IV SOLN
20.0000 mg | Freq: Two times a day (BID) | INTRAVENOUS | Status: DC
  Administered 2017-12-27 – 2017-12-28 (×3): 20 mg via INTRAVENOUS
  Filled 2017-12-27 (×3): qty 50

## 2017-12-27 MED ORDER — PANTOPRAZOLE SODIUM 40 MG PO TBEC
40.0000 mg | DELAYED_RELEASE_TABLET | Freq: Two times a day (BID) | ORAL | Status: DC
  Administered 2017-12-27: 40 mg via ORAL
  Filled 2017-12-27 (×3): qty 1

## 2017-12-27 MED ORDER — INSULIN ASPART 100 UNIT/ML ~~LOC~~ SOLN
0.0000 [IU] | Freq: Every day | SUBCUTANEOUS | Status: DC
  Administered 2017-12-29: 22:00:00 3 [IU] via SUBCUTANEOUS
  Filled 2017-12-27: qty 1

## 2017-12-27 MED ORDER — LEVOTHYROXINE SODIUM 75 MCG PO TABS
75.0000 ug | ORAL_TABLET | Freq: Every day | ORAL | Status: DC
  Administered 2017-12-28 – 2017-12-30 (×3): 75 ug via ORAL
  Filled 2017-12-27: qty 3
  Filled 2017-12-27 (×2): qty 1
  Filled 2017-12-27 (×2): qty 3
  Filled 2017-12-27: qty 1

## 2017-12-27 MED ORDER — INSULIN ASPART 100 UNIT/ML ~~LOC~~ SOLN
3.0000 [IU] | Freq: Three times a day (TID) | SUBCUTANEOUS | Status: DC
  Administered 2017-12-27 – 2017-12-30 (×2): 3 [IU] via SUBCUTANEOUS
  Filled 2017-12-27 (×2): qty 1

## 2017-12-27 MED ORDER — ATORVASTATIN CALCIUM 20 MG PO TABS
40.0000 mg | ORAL_TABLET | Freq: Every day | ORAL | Status: DC
  Administered 2017-12-27 – 2017-12-29 (×3): 40 mg via ORAL
  Filled 2017-12-27 (×3): qty 2

## 2017-12-27 MED ORDER — SENNOSIDES-DOCUSATE SODIUM 8.6-50 MG PO TABS: 1.0000 | ORAL_TABLET | Freq: Every evening | ORAL | Status: DC | PRN

## 2017-12-27 MED ORDER — SODIUM CHLORIDE 0.9 % IV SOLN
INTRAVENOUS | Status: DC
  Administered 2017-12-27 – 2017-12-29 (×4): via INTRAVENOUS

## 2017-12-27 MED ORDER — SODIUM CHLORIDE 0.9 % IV SOLN
50.0000 ug/h | INTRAVENOUS | Status: DC
  Administered 2017-12-27 – 2017-12-28 (×2): 50 ug/h via INTRAVENOUS
  Filled 2017-12-27 (×6): qty 1

## 2017-12-27 MED ORDER — ACETAMINOPHEN 650 MG RE SUPP: 650.0000 mg | Freq: Four times a day (QID) | RECTAL | Status: DC | PRN

## 2017-12-27 MED ORDER — SODIUM CHLORIDE 0.9 % IV BOLUS
2000.0000 mL | Freq: Once | INTRAVENOUS | Status: AC
  Administered 2017-12-27: 2000 mL via INTRAVENOUS

## 2017-12-27 MED ORDER — ACETAMINOPHEN 325 MG PO TABS
650.0000 mg | ORAL_TABLET | Freq: Four times a day (QID) | ORAL | Status: DC | PRN
  Administered 2017-12-28 – 2017-12-29 (×2): 650 mg via ORAL
  Filled 2017-12-27 (×3): qty 2

## 2017-12-27 MED ORDER — OXYCODONE-ACETAMINOPHEN 5-325 MG PO TABS
1.0000 | ORAL_TABLET | Freq: Four times a day (QID) | ORAL | Status: DC | PRN
  Administered 2017-12-27 – 2017-12-30 (×4): 1 via ORAL
  Filled 2017-12-27 (×4): qty 1

## 2017-12-27 MED ORDER — CYCLOBENZAPRINE HCL 10 MG PO TABS: 10.0000 mg | ORAL_TABLET | Freq: Three times a day (TID) | ORAL | Status: DC | PRN

## 2017-12-27 NOTE — Progress Notes (Signed)
Spoke with pt's husband, he states he doesn't have a Management consultant or transportation.  I explained she may have a endoscopy tomorrow and that the doctor would call to explain the procedure as the patient may be too lethargic or confused to understand.  Dorna Bloom RN

## 2017-12-27 NOTE — ED Provider Notes (Signed)
[facility] Emergency Department Provider Note  ____________________________________________  Time seen: Approximately 6:46 AM  I have reviewed the triage vital signs and the nursing notes.   HISTORY  Chief Complaint Hyperglycemia    HPI [name] is a 62 y.o. female with a history of diverticulitis, diabetes, hypertension, appendectomy, cholecystectomy who complains of feeling weak due to not having her diabetes medicines. She ran out and has not had money to buy refills. Complains of generalized abdominal pain. No vomiting or diarrhea. No fevers chills chest pain shortness of breath or cough. Symptoms are gradual onset, worsening over the past week, no aggravating or alleviating factors. Severe.      Past Medical History:  Diagnosis Date  . Allergy   . Anxiety   . Cancer (HCC)    HX OF CANCER OF UTERUS   . Cirrhosis of liver not due to alcohol ([place]) 2016  . Degenerative disk disease   . Diverticulitis   . Gastroparesis   . GERD (gastroesophageal reflux disease)   . History of hiatal hernia   . Hypertension   . Hypothyroid   . Intussusception intestine ([place]) 05/2015  . PAF (paroxysmal atrial fibrillation) ([place]) 03/2015   a. new onset 03/2015 in setting of intractable N/V; b. on Eliquis 5 mg bid; c. CHADSVASc 4 (DM, TIA x 2, female)  . Pancreatitis   . Sick sinus syndrome ([place])   . Stomach ulcer   . Stroke [facility])    with minimal left sided weakness  . Syncope 01/2015  . TIA (transient ischemic attack) 02/2015  . Type 1 diabetes ([place])    on levemir     Patient Active Problem List   Diagnosis Date Noted  . Sepsis ([place]) 12/15/2017  . Right ureteral stone 07/14/2016  . Personal history of surgery to heart and great vessels, presenting hazards to health   . Gastritis   . Foreign body in stomach   . Abnormal findings-gastrointestinal tract   . Congestive dilated cardiomyopathy ([place])   . Generalized abdominal pain   . Nausea & vomiting    . NSTEMI (non-ST elevated myocardial infarction) ([place]) 01/11/2016  . Tachyarrhythmia 01/10/2016  . Tachy-brady syndrome ([place]) 12/28/2015  . Colitis 12/16/2015  . Pneumonia 11/14/2015  . Narcotic withdrawal ([place]) 11/11/2015  . Orthostatic hypotension   . H/O TIA (transient ischemic attack) and stroke   . Left-sided weakness 10/04/2015  . Ileus ([place]) 08/01/2015  . Ascites   . Cryptogenic cirrhosis ([place]) 07/10/2015  . Paroxysmal atrial fibrillation ([place]) 07/10/2015  . C. difficile colitis 07/10/2015  . GI (gastrointestinal bleed) 07/06/2015  . Malnutrition of moderate degree 07/04/2015  . Symptomatic bradycardia 05/27/2015  . Intussusception intestine ([place])   . Hypothyroidism due to amiodarone   . Abdominal pain, chronic, epigastric   . Chronic anemia 05/19/2015  . Thrombocytopenia ([place]) 05/19/2015  . Prolonged QT interval   . Hypomagnesemia   . Narcotic abuse ([place])   . Uncontrolled type 2 diabetes mellitus ([place])   . Syncope due to orthostatic hypotension 05/18/2015  . Hypokalemia 04/06/2015  . Hyperlipidemia with target LDL less than 100 04/06/2015  . CVA (cerebral infarction) 02/15/2015  . Essential hypertension 01/12/2015  . Chronically on opiate therapy 01/12/2015  . Gastroparesis 01/12/2015  . DEPRESSION/ANXIETY 06/27/2007  . MYOFASCIAL PAIN SYNDROME 06/27/2007  . Chronic pain syndrome 03/28/2007  . GERD 03/27/2007  . DIVERTICULOSIS, COLON 03/27/2007  . LUMBAR DISC DISPLACEMENT 03/27/2007  . PROTEINURIA 03/27/2007  . UTERINE CANCER, HX OF 03/27/2007  Past Surgical History:  Procedure Laterality Date  . ABDOMINAL HYSTERECTOMY    . CARDIAC CATHETERIZATION N/A 01/12/2016   Procedure: Left Heart Cath and Coronary Angiography;  Surgeon: Wellington Hampshire, MD;  Location: [place] [facility] LAB;  Service: Cardiovascular;  Laterality: N/A;  . CHOLECYSTECTOMY    . CYSTOSCOPY/URETEROSCOPY/HOLMIUM LASER Right 07/14/2016   Procedure: CYSTOSCOPY/URETEROSCOPY/HOLMIUM LASER;   Surgeon: Alexis Frock, MD;  Location: [facility] ORS;  Service: Urology;  Laterality: Right;  . ESOPHAGOGASTRODUODENOSCOPY N/A 04/04/2015   Procedure: ESOPHAGOGASTRODUODENOSCOPY (EGD);  Surgeon: Hulen Luster, MD;  Location: [facility] ENDOSCOPY;  Service: Endoscopy;  Laterality: N/A;  . ESOPHAGOGASTRODUODENOSCOPY (EGD) WITH PROPOFOL N/A 01/18/2016   Procedure: ESOPHAGOGASTRODUODENOSCOPY (EGD) WITH PROPOFOL;  Surgeon: Lucilla Lame, MD;  Location: [facility] ENDOSCOPY;  Service: Endoscopy;  Laterality: N/A;  . FLEXIBLE SIGMOIDOSCOPY N/A 01/18/2016   Procedure: FLEXIBLE SIGMOIDOSCOPY;  Surgeon: Lucilla Lame, MD;  Location: [facility] ENDOSCOPY;  Service: Endoscopy;  Laterality: N/A;  . HERNIA REPAIR       Prior to Admission medications   Medication Sig Start Date End Date Taking? Authorizing Provider  albuterol (PROVENTIL HFA;VENTOLIN HFA) 108 (90 Base) MCG/ACT inhaler Inhale 2 puffs into the lungs every 6 (six) hours as needed for wheezing or shortness of breath. 12/17/17   Henreitta Leber, MD  apixaban (ELIQUIS) 5 MG TABS tablet Take 1 tablet (5 mg total) by mouth 2 (two) times daily. 12/17/17 01/16/18  Henreitta Leber, MD  atorvastatin (LIPITOR) 40 MG tablet Take 1 tablet (40 mg total) by mouth at bedtime. 12/17/17 01/16/18  Henreitta Leber, MD  budesonide-formoterol (SYMBICORT) 160-4.5 MCG/ACT inhaler Inhale 2 puffs into the lungs 2 (two) times daily. 12/17/17   Henreitta Leber, MD  cyclobenzaprine (FLEXERIL) 10 MG tablet Take 10 mg by mouth 3 (three) times daily as needed for muscle spasms.  11/18/17   [provider]  insulin aspart (NOVOLOG FLEXPEN) 100 UNIT/ML FlexPen Inject 5 units into the skin with meals for blood sugars above 130. 12/17/17   Sainani, Belia Heman, MD  Insulin Detemir (LEVEMIR FLEXPEN) 100 UNIT/ML Pen Inject 12 Units into the skin every evening. 12/17/17   Henreitta Leber, MD  levothyroxine (SYNTHROID, LEVOTHROID) 75 MCG tablet Take 1 tablet (75 mcg total) by mouth daily before breakfast. 12/17/17 01/16/18   Henreitta Leber, MD  lisinopril (PRINIVIL,ZESTRIL) 5 MG tablet Take 1 tablet by mouth every morning for kidney protection against diabetes. 12/17/17   Henreitta Leber, MD  metoprolol tartrate (LOPRESSOR) 25 MG tablet Take 1 tablet (25 mg total) by mouth 2 (two) times daily. 12/17/17 01/16/18  Henreitta Leber, MD  tiZANidine (ZANAFLEX) 4 MG tablet Take 1 tablet (4 mg total) by mouth 3 (three) times daily as needed for muscle spasms. Patient not taking: Reported on 12/17/2017 09/17/16   [name], NP  XTAMPZA ER 9 MG C12A Take 9 mg by mouth every 8 (eight) hours. 11/18/17   [provider]     Allergies Hydrocodone; Aspirin; Erythromycin; Prednisone; Rosiglitazone maleate; Codeine sulfate; and Tetanus-diphtheria toxoids td   Family History  Problem Relation Age of Onset  . Hypertension Mother   . CAD Sister   . Heart attack Sister        Deceased 11/08/2014  . CAD Brother     Social History Social History   Tobacco Use  . Smoking status: Former Smoker    Types: Cigarettes  . Smokeless tobacco: Never Used  . Tobacco comment: 25 years ago and only smoked occasionally  Substance Use Topics  . Alcohol use: No  . Drug use: No    Review of Systems  Constitutional:   No fever or chills.  ENT:   No sore throat. No rhinorrhea. Cardiovascular:   No chest pain or syncope. Respiratory:   No dyspnea or cough. Gastrointestinal:   positive as above for abdominal pain without vomiting and diarrhea.  Musculoskeletal:   Negative for focal pain or swelling All other systems reviewed and are negative except as documented above in ROS and HPI.  ____________________________________________   PHYSICAL EXAM:  VITAL SIGNS: ED Triage Vitals  Enc Vitals Group     BP --      Pulse Rate 12/27/17 0610 (!) 119     Resp 12/27/17 0610 18     Temp 12/27/17 0610 99 F (37.2 C)     Temp Source 12/27/17 0610 Oral     SpO2 12/27/17 0610 95 %     Weight 12/27/17 0611 143 lb (64.9 kg)      Height 12/27/17 0611 5' 3"  (1.6 m)     Head Circumference --      Peak Flow --      Pain Score 12/27/17 0611 3     Pain Loc --      Pain Edu? --      Excl. in [place]? --     Vital signs reviewed, nursing assessments reviewed.   Constitutional:   Alert and oriented. not in distress Eyes:   Conjunctivae are normal. EOMI. PERRL. ENT      Head:   Normocephalic and atraumatic.      Nose:   No congestion/rhinnorhea.       Mouth/Throat:   dry mucous membranes, no pharyngeal erythema. No peritonsillar mass.       Neck:   No meningismus. Full ROM. Hematological/Lymphatic/Immunilogical:   No cervical lymphadenopathy. Cardiovascular:   tachycardia heart rate 110. Symmetric bilateral radial and DP pulses.  No murmurs.  Respiratory:   Normal respiratory effort without tachypnea/retractions. Breath sounds are clear and equal bilaterally. No wheezes/rales/rhonchi. Gastrointestinal:   Soft with generalized tenderness. Exam is nonfocal. Non distended. There is no CVA tenderness.  No rebound, rigidity, or guarding. Genitourinary:   deferred Musculoskeletal:   Normal range of motion in all extremities. No joint effusions.  No lower extremity tenderness.  No edema. Neurologic:   Normal speech and language.  Motor grossly intact. No acute focal neurologic deficits are appreciated.  Skin:    Skin is warm, dry and intact. No rash noted.  No petechiae, purpura, or bullae.  ____________________________________________    LABS (pertinent positives/negatives) (all labs ordered are listed, but only abnormal results are displayed) Labs Reviewed  BASIC METABOLIC PANEL - Abnormal; Notable for the following components:      Result Value   Sodium 131 (*)    Glucose, Bld 456 (*)    Creatinine, Ser 1.37 (*)    Calcium 7.9 (*)    GFR calc non Af Amer 40 (*)    GFR calc Af Amer 46 (*)    All other components within normal limits  CBC - Abnormal; Notable for the following components:   RBC 3.41 (*)     Hemoglobin 8.7 (*)    HCT 27.4 (*)    MCH 25.6 (*)    MCHC 31.8 (*)    RDW 20.1 (*)    Platelets 131 (*)    All other components within normal limits  GLUCOSE, CAPILLARY - Abnormal; Notable for the following  components:   Glucose-Capillary 421 (*)    All other components within normal limits  URINALYSIS, COMPLETE (UACMP) WITH MICROSCOPIC  CBG MONITORING, ED   ____________________________________________   EKG  interpreted by me Sinus tachycardia rate 119, normal axis and intervals. Normal QRS and ST-T segments. T wave inversions in lead 3 V5 and V6.unchanged from previous EKG 12/18/2017.  ____________________________________________    RADIOLOGY  No results found.  ____________________________________________   PROCEDURES Procedures  ____________________________________________  DIFFERENTIAL DIAGNOSIS   dehydration, DKA, urinary tract infection, intra-abdominal infection such as abscess diverticulitis.  CLINICAL IMPRESSION / ASSESSMENT AND PLAN / ED COURSE  Pertinent labs & imaging results that were available during my care of the patient were reviewed by me and considered in my medical decision making (see chart for details).    patient presents with hyperglycemia due to medication noncompliance. Clinically appears dehydrated. I gave IV fluids will checking labs to evaluate for DKA. His symptoms are not improving with hydration and IV insulin bolus for glucose control, patient may need CT scan of the brain without contrast as well as CT scan of the abdomen to evaluate for acute cause of the symptoms.  Clinical Course as of Dec 27 740  Fri Dec 27, 2017  0646 Labs unremarkable. Will give IVF hydration, IV insulin .    [PS]    Clinical Course User Index [PS] Carrie Mew, MD     ----------------------------------------- 7:40 AM on 12/27/2017 -----------------------------------------  Labs so far unremarkable. CBC appears overall diluted with hemoglobin  within range of her historical baseline, no symptoms to suggest acute blood loss. Care signed out to Dr. Owens Shark pending hydration and reassessment for improvement.  ____________________________________________   FINAL CLINICAL IMPRESSION(S) / ED DIAGNOSES    Final diagnoses:  Hyperglycemia  Dehydration     ED Discharge Orders    None      Portions of this note were generated with dragon dictation software. Dictation errors may occur despite best attempts at proofreading.    Carrie Mew, MD 12/27/17 [phone]

## 2017-12-27 NOTE — ED Triage Notes (Signed)
Arrived via EMS husband states she is confused. CBG 500 by EMS. Pt ran out of her DM medication about 4 months ago and has not had the money to refill. Pt is A&O NAD

## 2017-12-27 NOTE — Care Management (Addendum)
RNCM spoke to patient's husband [name] [phone] as per note patient is confused. I explained MOON letter to husband. He would like for her to have Home health but states that she refuses to let visit visit after she agrees each time.  He has not preference of agencies. [place] independent at base per husband but has a walker if needed.  She has not PCP and typically followed by GI per husband.  Patient came in through ED but has since gone to unit.  RNCM on unit is speaking with patient now. Patient told RNCM that she has an appointment with Quinn Axe on Tuesday with [place]. I cannot find a C/Kate Southern listed as a provider. This is probably the problem with obtaining medications -no Probation officer. She has Medicaid and therefore her medications should be covered.  [facility] declined patient on last presentation as she did not qualify. I have sent message to [facility] RN to see if there is anything [facility] can do to assist with care.

## 2017-12-27 NOTE — H&P (Signed)
[place] [facility] [place] NAME: [name]    MR#:  [id number]  DATE OF BIRTH:  [birth year]  DATE OF ADMISSION:  12/27/2017  PRIMARY CARE PHYSICIAN: Patient, No Pcp Per   REQUESTING/REFERRING PHYSICIAN: Dr. Owens Shark.  CHIEF COMPLAINT:   Chief Complaint  Patient presents with  . Hyperglycemia   High blood sugar today. HISTORY OF PRESENT ILLNESS:  [name]  is a 62 y.o. female with a known history of multiple medical problems as below.  The patient was just recently admitted for hyperglycemia and UTI.  She got 30-day supply and she said she is compliance to the medication.  But she comes to ED due to hyperglycemia.  She complains of generalized weakness and melena for 1 week.  She has history of A. fib and taking Eliquis.  Hemoglobin decreased from 11.3, to 10.6 and to 8.7 today.  Stool occult is positive in the ED.  Dr. Owens Shark requested admission.  PAST MEDICAL HISTORY:   Past Medical History:  Diagnosis Date  . Allergy   . Anxiety   . Cancer (HCC)    HX OF CANCER OF UTERUS   . Cirrhosis of liver not due to alcohol ([place]) 2016  . Degenerative disk disease   . Diverticulitis   . Gastroparesis   . GERD (gastroesophageal reflux disease)   . History of hiatal hernia   . Hypertension   . Hypothyroid   . Intussusception intestine ([place]) 05/2015  . PAF (paroxysmal atrial fibrillation) ([place]) 03/2015   a. new onset 03/2015 in setting of intractable N/V; b. on Eliquis 5 mg bid; c. CHADSVASc 4 (DM, TIA x 2, female)  . Pancreatitis   . Sick sinus syndrome ([place])   . Stomach ulcer   . Stroke [facility])    with minimal left sided weakness  . Syncope 01/2015  . TIA (transient ischemic attack) 02/2015  . Type 1 diabetes ([place])    on levemir    PAST SURGICAL HISTORY:   Past Surgical History:  Procedure Laterality Date  . ABDOMINAL HYSTERECTOMY    . CARDIAC CATHETERIZATION N/A 01/12/2016   Procedure: Left Heart Cath and Coronary Angiography;   Surgeon: Wellington Hampshire, MD;  Location: [place] CV LAB;  Service: Cardiovascular;  Laterality: N/A;  . CHOLECYSTECTOMY    . CYSTOSCOPY/URETEROSCOPY/HOLMIUM LASER Right 07/14/2016   Procedure: CYSTOSCOPY/URETEROSCOPY/HOLMIUM LASER;  Surgeon: Alexis Frock, MD;  Location: [facility] ORS;  Service: Urology;  Laterality: Right;  . ESOPHAGOGASTRODUODENOSCOPY N/A 04/04/2015   Procedure: ESOPHAGOGASTRODUODENOSCOPY (EGD);  Surgeon: Hulen Luster, MD;  Location: [facility] ENDOSCOPY;  Service: Endoscopy;  Laterality: N/A;  . ESOPHAGOGASTRODUODENOSCOPY (EGD) WITH PROPOFOL N/A 01/18/2016   Procedure: ESOPHAGOGASTRODUODENOSCOPY (EGD) WITH PROPOFOL;  Surgeon: Lucilla Lame, MD;  Location: [facility] ENDOSCOPY;  Service: Endoscopy;  Laterality: N/A;  . FLEXIBLE SIGMOIDOSCOPY N/A 01/18/2016   Procedure: FLEXIBLE SIGMOIDOSCOPY;  Surgeon: Lucilla Lame, MD;  Location: [facility] ENDOSCOPY;  Service: Endoscopy;  Laterality: N/A;  . HERNIA REPAIR      SOCIAL HISTORY:   Social History   Tobacco Use  . Smoking status: Former Smoker    Types: Cigarettes  . Smokeless tobacco: Never Used  . Tobacco comment: 25 years ago and only smoked occasionally  Substance Use Topics  . Alcohol use: No    FAMILY HISTORY:   Family History  Problem Relation Age of Onset  . Hypertension Mother   . CAD Sister   . Heart attack Sister        Deceased  10/2014  . CAD Brother     DRUG ALLERGIES:   Allergies  Allergen Reactions  . Hydrocodone Other (See Comments)    Pt states that this medication caused cirrhosis of the liver.    . Aspirin   . Erythromycin Other (See Comments)    Reaction:  Fever   . Prednisone Other (See Comments)    Reaction:  Unknown   . Rosiglitazone Maleate Swelling  . Codeine Sulfate Rash  . Tetanus-Diphtheria Toxoids Td Rash and Other (See Comments)    Reaction:  Fever     REVIEW OF SYSTEMS:   Review of Systems  Constitutional: Positive for malaise/fatigue. Negative for chills and fever.  HENT: Negative for  sore throat.   Eyes: Negative for blurred vision and double vision.  Respiratory: Negative for cough, hemoptysis, shortness of breath, wheezing and stridor.   Cardiovascular: Negative for chest pain, palpitations, orthopnea and leg swelling.  Gastrointestinal: Positive for abdominal pain and melena. Negative for blood in stool, diarrhea, nausea and vomiting.  Genitourinary: Negative for dysuria, flank pain and hematuria.  Musculoskeletal: Negative for back pain and joint pain.  Skin: Negative for rash.  Neurological: Negative for dizziness, sensory change, focal weakness, seizures, loss of consciousness, weakness and headaches.  Endo/Heme/Allergies: Negative for polydipsia.  Psychiatric/Behavioral: Negative for depression. The patient is not nervous/anxious.     MEDICATIONS AT HOME:   Prior to Admission medications   Medication Sig Start Date End Date Taking? Authorizing Provider  albuterol (PROVENTIL HFA;VENTOLIN HFA) 108 (90 Base) MCG/ACT inhaler Inhale 2 puffs into the lungs every 6 (six) hours as needed for wheezing or shortness of breath. 12/17/17  Yes Sainani, Belia Heman, MD  apixaban (ELIQUIS) 5 MG TABS tablet Take 1 tablet (5 mg total) by mouth 2 (two) times daily. 12/17/17 01/16/18 Yes Sainani, Belia Heman, MD  atorvastatin (LIPITOR) 40 MG tablet Take 1 tablet (40 mg total) by mouth at bedtime. 12/17/17 01/16/18 Yes Sainani, Belia Heman, MD  budesonide-formoterol (SYMBICORT) 160-4.5 MCG/ACT inhaler Inhale 2 puffs into the lungs 2 (two) times daily. 12/17/17  Yes Henreitta Leber, MD  cyclobenzaprine (FLEXERIL) 10 MG tablet Take 10 mg by mouth 3 (three) times daily as needed for muscle spasms.  11/18/17  Yes [provider]  insulin aspart (NOVOLOG FLEXPEN) 100 UNIT/ML FlexPen Inject 5 units into the skin with meals for blood sugars above 130. Patient taking differently: Inject 5 Units into the skin 3 (three) times daily with meals as needed for high blood sugar (for blood sugar >130).  12/17/17  Yes  Sainani, Belia Heman, MD  Insulin Detemir (LEVEMIR FLEXPEN) 100 UNIT/ML Pen Inject 12 Units into the skin every evening. 12/17/17  Yes Henreitta Leber, MD  levothyroxine (SYNTHROID, LEVOTHROID) 75 MCG tablet Take 1 tablet (75 mcg total) by mouth daily before breakfast. 12/17/17 01/16/18 Yes Sainani, Belia Heman, MD  lisinopril (PRINIVIL,ZESTRIL) 5 MG tablet Take 1 tablet by mouth every morning for kidney protection against diabetes. 12/17/17  Yes Henreitta Leber, MD  metoprolol tartrate (LOPRESSOR) 25 MG tablet Take 1 tablet (25 mg total) by mouth 2 (two) times daily. 12/17/17 01/16/18 Yes Sainani, Belia Heman, MD  oxyCODONE ER [facility] ER) 9 MG C12A Take 9 mg by mouth every 8 (eight) hours.   Yes [provider]      VITAL SIGNS:  Blood pressure 120/83, pulse (!) 105, temperature 99 F (37.2 C), temperature source Oral, resp. rate 17, height 5' 3"  (1.6 m), weight 143 lb (64.9 kg), SpO2 98 %.  PHYSICAL EXAMINATION:  Physical Exam  GENERAL:  62 y.o.-year-old patient lying in the bed with no acute distress.  EYES: Pupils equal, round, reactive to light and accommodation. No scleral icterus. Extraocular muscles intact.  HEENT: Head atraumatic, normocephalic. Oropharynx and nasopharynx clear.  NECK:  Supple, no jugular venous distention. No thyroid enlargement, no tenderness.  LUNGS: Normal breath sounds bilaterally, no wheezing, rales,rhonchi or crepitation. No use of accessory muscles of respiration.  CARDIOVASCULAR: S1, S2 normal. No murmurs, rubs, or gallops.  ABDOMEN: Soft, nontender, nondistended. Bowel sounds present. No organomegaly or mass.  EXTREMITIES: No pedal edema, cyanosis, or clubbing.  NEUROLOGIC: Cranial nerves II through XII are intact. Muscle strength 5/5 in all extremities. Sensation intact. Gait not checked.  PSYCHIATRIC: The patient is alert and oriented x 3.  SKIN: No obvious rash, lesion, or ulcer.   LABORATORY PANEL:   CBC Recent Labs  Lab 12/27/17 0616  WBC 5.8  HGB 8.7*    HCT 27.4*  PLT 131*   ------------------------------------------------------------------------------------------------------------------  Chemistries  Recent Labs  Lab 12/27/17 0616  NA 131*  K 3.8  CL 101  CO2 22  GLUCOSE 456*  BUN 14  CREATININE 1.37*  CALCIUM 7.9*   ------------------------------------------------------------------------------------------------------------------  Cardiac Enzymes No results for input(s): TROPONINI in the last 168 hours. ------------------------------------------------------------------------------------------------------------------  RADIOLOGY:  No results found.    IMPRESSION AND PLAN:   Melena, possible upper GI bleeding. IV Protonix.  Clear liquid diet and n.p.o. after midnight for possible endoscopy. Hold Eliquis, follow hemoglobin and GI consult.  Anemia of chronic disease and the possible due to acute blood loss secondary to GI bleeding as above.  Follow-up hemoglobin and GI consult.  Hold Eliquis.  Hyperglycemia due to diabetes 1.  Possible due to noncompliance. Start sliding scale, NovoLog before meals and Levemir trioleate at bedtime.  ARF on CKD stage III.  Hold lisinopril and start normal saline IV, follow-up BMP. Thrombocytopenia.  Follow-up CBC.  All the records are reviewed and case discussed with ED provider. Management plans discussed with the patient, family and they are in agreement.  CODE STATUS: Full code  TOTAL TIME TAKING CARE OF THIS PATIENT: 55 minutes.    Demetrios Loll M.D on 12/27/2017 at 11:47 AM  Between 7am to 6pm - Pager - [phone]  After 6pm go to [email or web address] - Proofreader  [facility]  Office  [phone]  CC: Primary care physician; Patient, No Pcp Per   Note: This dictation was prepared with Dragon dictation along with smaller phrase technology. Any transcriptional errors that result from this process are unin

## 2017-12-27 NOTE — Care Management Obs Status (Signed)
[place] [facility] NOTIFICATION   Patient Details  Name: [name] MRN: [id number] Date of Birth: [birth year]   Medicare Observation Status Notification Given:  Yes. Permission to x given by Ms. Elroy Channel, RN 12/27/2017, 2:32 PM

## 2017-12-27 NOTE — Consult Note (Signed)
Cephas Darby, MD [address]  [place], [place] [address]  Main: [phone]  Fax: [phone] Pager: [phone]   Consultation  Referring Provider:     No ref. provider found Primary Care Physician:  Patient, No Pcp Per Primary Gastroenterologist: None     Reason for Consultation:     melena  Date of Admission:  12/27/2017 Date of Consultation:  12/27/2017         HPI:   [name] is a 62 y.o. Caucasian female history of decompensated cirrhosis with ascites, most likely secondary to NASH, history of diabetes, who is admitted with altered mental status and hyperglycemia secondary to noncompliance to diabetic medications. She also reports generalized weakness and melena for the last 1 week. She is found to have drop in hemoglobin from 10.6 on 12/16/2017 to 8.7 on admission. She also has mild thrombocytopenia. She is found to have elevated blood sugars in 400s.Her BUN and creatinine are at baseline. She has history of paroxysmal A. Fib, on Eliquis. It is unclear if patient is actively taking Eliquis are not. Patient denies abdominal pain, nausea, vomiting.  NSAIDs: none  Antiplts/Anticoagulants/Anti thrombotics: Eliquis for paroxysmal unclear when the last dose was  GI Procedures: EGD in 2017, no evidence of varices Flex sig in 2017 for evaluation of colitis which revealed sigmoid diverticulosis DIAGNOSIS:  A. STOMACH; COLD BIOPSY:  - ANTRAL MUCOSA WITH SUPERFICIAL VASCULAR CONGESTION AND MINIMAL CHRONIC  GASTRITIS.  - NEGATIVE FOR H. PYLORI, DYSPLASIA AND MALIGNANCY.   B. RANDOM COLON; COLD BIOPSY:  - COLONIC MUCOSA NEGATIVE FOR MICROSCOPIC COLITIS, DYSPLASIA AND  MALIGNANCY.   Past Medical History:  Diagnosis Date  . Allergy   . Anxiety   . Cancer (HCC)    HX OF CANCER OF UTERUS   . Cirrhosis of liver not due to alcohol ([place]) 2016  . Degenerative disk disease   . Diverticulitis   . Gastroparesis   . GERD (gastroesophageal reflux disease)    . History of hiatal hernia   . Hypertension   . Hypothyroid   . Intussusception intestine ([place]) 05/2015  . PAF (paroxysmal atrial fibrillation) ([place]) 03/2015   a. new onset 03/2015 in setting of intractable N/V; b. on Eliquis 5 mg bid; c. CHADSVASc 4 (DM, TIA x 2, female)  . Pancreatitis   . Sick sinus syndrome ([place])   . Stomach ulcer   . Stroke [facility])    with minimal left sided weakness  . Syncope 01/2015  . TIA (transient ischemic attack) 02/2015  . Type 1 diabetes ([place])    on levemir    Past Surgical History:  Procedure Laterality Date  . ABDOMINAL HYSTERECTOMY    . CARDIAC CATHETERIZATION N/A 01/12/2016   Procedure: Left Heart Cath and Coronary Angiography;  Surgeon: Wellington Hampshire, MD;  Location: [place] CV LAB;  Service: Cardiovascular;  Laterality: N/A;  . CHOLECYSTECTOMY    . CYSTOSCOPY/URETEROSCOPY/HOLMIUM LASER Right 07/14/2016   Procedure: CYSTOSCOPY/URETEROSCOPY/HOLMIUM LASER;  Surgeon: Alexis Frock, MD;  Location: [facility] ORS;  Service: Urology;  Laterality: Right;  . ESOPHAGOGASTRODUODENOSCOPY N/A 04/04/2015   Procedure: ESOPHAGOGASTRODUODENOSCOPY (EGD);  Surgeon: Hulen Luster, MD;  Location: [facility] ENDOSCOPY;  Service: Endoscopy;  Laterality: N/A;  . ESOPHAGOGASTRODUODENOSCOPY (EGD) WITH PROPOFOL N/A 01/18/2016   Procedure: ESOPHAGOGASTRODUODENOSCOPY (EGD) WITH PROPOFOL;  Surgeon: Lucilla Lame, MD;  Location: [facility] ENDOSCOPY;  Service: Endoscopy;  Laterality: N/A;  . FLEXIBLE SIGMOIDOSCOPY N/A 01/18/2016   Procedure: FLEXIBLE SIGMOIDOSCOPY;  Surgeon: Lucilla Lame, MD;  Location: [facility] ENDOSCOPY;  Service: Endoscopy;  Laterality: N/A;  . HERNIA REPAIR      Prior to Admission medications   Medication Sig Start Date End Date Taking? Authorizing Provider  albuterol (PROVENTIL HFA;VENTOLIN HFA) 108 (90 Base) MCG/ACT inhaler Inhale 2 puffs into the lungs every 6 (six) hours as needed for wheezing or shortness of breath. 12/17/17  Yes Sainani, Belia Heman, MD  apixaban (ELIQUIS) 5 MG  TABS tablet Take 1 tablet (5 mg total) by mouth 2 (two) times daily. 12/17/17 01/16/18 Yes Sainani, Belia Heman, MD  atorvastatin (LIPITOR) 40 MG tablet Take 1 tablet (40 mg total) by mouth at bedtime. 12/17/17 01/16/18 Yes Sainani, Belia Heman, MD  budesonide-formoterol (SYMBICORT) 160-4.5 MCG/ACT inhaler Inhale 2 puffs into the lungs 2 (two) times daily. 12/17/17  Yes Henreitta Leber, MD  cyclobenzaprine (FLEXERIL) 10 MG tablet Take 10 mg by mouth 3 (three) times daily as needed for muscle spasms.  11/18/17  Yes [provider]  insulin aspart (NOVOLOG FLEXPEN) 100 UNIT/ML FlexPen Inject 5 units into the skin with meals for blood sugars above 130. Patient taking differently: Inject 5 Units into the skin 3 (three) times daily with meals as needed for high blood sugar (for blood sugar >130).  12/17/17  Yes Sainani, Belia Heman, MD  Insulin Detemir (LEVEMIR FLEXPEN) 100 UNIT/ML Pen Inject 12 Units into the skin every evening. 12/17/17  Yes Henreitta Leber, MD  levothyroxine (SYNTHROID, LEVOTHROID) 75 MCG tablet Take 1 tablet (75 mcg total) by mouth daily before breakfast. 12/17/17 01/16/18 Yes Sainani, Belia Heman, MD  lisinopril (PRINIVIL,ZESTRIL) 5 MG tablet Take 1 tablet by mouth every morning for kidney protection against diabetes. 12/17/17  Yes Henreitta Leber, MD  metoprolol tartrate (LOPRESSOR) 25 MG tablet Take 1 tablet (25 mg total) by mouth 2 (two) times daily. 12/17/17 01/16/18 Yes Sainani, Belia Heman, MD  oxyCODONE ER [facility]) 9 MG C12A Take 9 mg by mouth every 8 (eight) hours.   Yes [provider]    Family History  Problem Relation Age of Onset  . Hypertension Mother   . CAD Sister   . Heart attack Sister        Deceased 11-16-14  . CAD Brother      Social History   Tobacco Use  . Smoking status: Former Smoker    Types: Cigarettes  . Smokeless tobacco: Never Used  . Tobacco comment: 25 years ago and only smoked occasionally  Substance Use Topics  . Alcohol use: No  . Drug use: No     Allergies as of 12/27/2017 - Review Complete 12/27/2017  Allergen Reaction Noted  . Hydrocodone Other (See Comments) 10/21/2015  . Aspirin  01/28/2016  . Erythromycin Other (See Comments) 12/16/2015  . Prednisone Other (See Comments) 10/04/2015  . Rosiglitazone maleate Swelling   . Codeine sulfate Rash   . Tetanus-diphtheria toxoids td Rash and Other (See Comments)     Review of Systems:    All systems reviewed and negative except where noted in HPI.   Physical Exam:  Vital signs in last 24 hours: Temp:  [97.8 F (36.6 C)-99 F (37.2 C)] 97.8 F (36.6 C) (04/19 1948) Pulse Rate:  [99-120] 103 (04/19 1948) Resp:  [13-21] 16 (04/19 1948) BP: (104-137)/(63-83) 115/77 (04/19 1948) SpO2:  [93 %-100 %] 98 % (04/19 1948) Weight:  [143 lb (64.9 kg)-154 lb 1.6 oz (69.9 kg)] 154 lb 1.6 oz (69.9 kg) (04/19 1248) Last BM Date: 12/26/17 General:   Pleasant,  cooperative in NAD Head:  Normocephalic and atraumatic. Eyes:   No icterus.   Conjunctiva pink. PERRLA. Ears:  Normal auditory acuity. Neck:  Supple; no masses or thyroidomegaly Lungs: Respirations even and unlabored. Lungs clear to auscultation bilaterally.   No wheezes, crackles, or rhonchi.  Heart:  Regular rate and rhythm;  Without murmur, clicks, rubs or gallops Abdomen:  Soft, mildly distended, nontender. Normal bowel sounds. No appreciable masses or hepatomegaly.  No rebound or guarding.  Rectal:  Not performed. Msk:  Symmetrical without gross deformities.  Muscle wasting Extremities:  Without edema, cyanosis or clubbing. Neurologic:  Alert and oriented x3;  grossly normal neurologically. Skin:  Intact without significant lesions or rashes. Psych:  Alert and cooperative. appears confused  LAB RESULTS: CBC Latest Ref Rng & Units 12/27/2017 12/16/2017 12/14/2017  WBC 3.6 - 11.0 K/uL 5.8 9.1 13.7(H)  Hemoglobin 12.0 - 16.0 g/dL 8.7(L) 10.6(L) 11.3(L)  Hematocrit 35.0 - 47.0 % 27.4(L) 33.9(L) 39.0  Platelets 150 - 440 K/uL  131(L) 208 256    BMET BMP Latest Ref Rng & Units 12/27/2017 12/17/2017 12/16/2017  Glucose 65 - 99 mg/dL 456(H) 195(H) 273(H)  BUN 6 - 20 mg/dL _0 Creatinine 0.44 - 1.00 mg/dL 1.37(H) 1.23(H) 1.41(H)  Sodium 135 - 145 mmol/L 131(L) 133(L) 133(L)  Potassium 3.5 - 5.1 mmol/L 3.8 3.8 3.5  Chloride 101 - 111 mmol/L 101 108 108  CO2 22 - 32 mmol/L 22 19(L) 18(L)  Calcium 8.9 - 10.3 mg/dL 7.9(L) 7.5(L) 7.3(L)    LFT Hepatic Function Latest Ref Rng & Units 12/14/2017 08/06/2016 08/03/2016  Total Protein 6.5 - 8.1 g/dL 7.1 6.1(L) 7.1  Albumin 3.5 - 5.0 g/dL 2.6(L) 3.4(L) 3.9  AST 15 - 41 U/L _1 ALT 14 - 54 U/L _2 Alk Phosphatase 38 - 126 U/L 176(H) 93 100  Total Bilirubin 0.3 - 1.2 mg/dL 1.0 1.1 0.9  Bilirubin, Direct 0.1 - 0.5 mg/dL - - -     STUDIES: No results found.    Impression / Plan:   [name] is a 62 y.o. Caucasian female with metabolic syndrome, poorly controlled diabetes, decompensated cirrhosis admitted with hyperglycemia, 1 week history of melena resulting in symptomatic anemia  Melena: Erosive esophagitis or peptic ulcer disease or portal hypertensive gastropathy or variceal bleed Recommend octreotide drip Protonix 40 mg twice a day Monitor CBC closely Check PT/INR Okay with clear liquid diet Nothing by mouth past midnight EGD tomorrow  Decompensated cirrhosis: most likely secondary to NASH Hepatitis C antibody negative Portal hypertension: with ascites, thrombocytopenia 2 g sodium diet Varices: unknown, EGD as above. Last EGD in 2017 did not reveal evidence of portal hypertensive gastropathy are varices HCC screening: Ultrasound Doppler, check AFP levels Hepatic encephalopathy: start rifaximin 550 mg twice daily Establish care with GI as outpatient  Thank you for involving me in the care of this patient.  Will follow along with you    LOS: 0 days   Sherri Sear, MD  12/27/2017, 8:44 PM   Note: This dictation was prepared with  Dragon dictation along with smaller phrase technology. Any transcriptional errors that result from this process are unintentional.

## 2017-12-27 NOTE — Progress Notes (Signed)
Inpatient Diabetes Program Recommendations  AACE/ADA: New Consensus Statement on Inpatient Glycemic Control (2015)  Target Ranges:  Prepandial:   less than 140 mg/dL      Peak postprandial:   less than 180 mg/dL (1-2 hours)      Critically ill patients:  140 - 180 mg/dL   Lab Results  Component Value Date   GLUCAP 339 (H) 12/27/2017   HGBA1C 15.5 (H) 12/14/2017    Review of Glycemic Control  Diabetes history: DM2 Outpatient Diabetes medications: Levemir 12 units q hs + Novolog 5 units tid  Inpatient Diabetes Program Recommendations:   Recent admission and DM Coordinator reviewed on 12/16/17. Case management consult placed regarding difficulty affording medications. Spoke with RN Everlean Alstrom and was able to speak to patient by phone (DM Coordinator calling from [facility]). Patient is groggy talking on the phone but states she does not know how much she is currently paying for insulin. Patient states she will ask her husband if he can find out how much the insulin is costing. Reviewed with patient to discuss with her MD option of Novolin 70/30 insulin mix from [facility] for $25 per vial. Novolin 70/30 15 units bid would =21 units basal +9 meal coverage  Thank you, Bethena Roys E. Marqueze Ramcharan, RN, MSN, CDE  Diabetes Coordinator Inpatient Glycemic Control Team Team Pager [phone] (8am-5pm) 12/27/2017 10:34 AM

## 2017-12-27 NOTE — ED Notes (Signed)
Blood glucose 421.

## 2017-12-28 ENCOUNTER — Encounter: Payer: Self-pay | Admitting: Anesthesiology

## 2017-12-28 ENCOUNTER — Observation Stay: Payer: Medicare HMO | Admitting: Anesthesiology

## 2017-12-28 ENCOUNTER — Observation Stay: Payer: Medicare HMO

## 2017-12-28 ENCOUNTER — Encounter
Admission: EM | Disposition: A | Discharge status: Home Health | Payer: Self-pay | Source: Home / Self Care | Attending: Internal Medicine

## 2017-12-28 ENCOUNTER — Inpatient Hospital Stay: Payer: Medicare HMO

## 2017-12-28 DIAGNOSIS — I129 Hypertensive chronic kidney disease with stage 1 through stage 4 chronic kidney disease, or unspecified chronic kidney disease: Secondary | ICD-10-CM | POA: Diagnosis present

## 2017-12-28 DIAGNOSIS — I480 Paroxysmal atrial fibrillation: Secondary | ICD-10-CM | POA: Diagnosis present

## 2017-12-28 DIAGNOSIS — K2960 Other gastritis without bleeding: Secondary | ICD-10-CM | POA: Diagnosis present

## 2017-12-28 DIAGNOSIS — B3781 Candidal esophagitis: Secondary | ICD-10-CM | POA: Diagnosis present

## 2017-12-28 DIAGNOSIS — K766 Portal hypertension: Secondary | ICD-10-CM | POA: Diagnosis present

## 2017-12-28 DIAGNOSIS — K7581 Nonalcoholic steatohepatitis (NASH): Secondary | ICD-10-CM | POA: Diagnosis not present

## 2017-12-28 DIAGNOSIS — E1022 Type 1 diabetes mellitus with diabetic chronic kidney disease: Secondary | ICD-10-CM | POA: Diagnosis present

## 2017-12-28 DIAGNOSIS — Z8542 Personal history of malignant neoplasm of other parts of uterus: Secondary | ICD-10-CM | POA: Diagnosis not present

## 2017-12-28 DIAGNOSIS — K2210 Ulcer of esophagus without bleeding: Secondary | ICD-10-CM

## 2017-12-28 DIAGNOSIS — K9171 Accidental puncture and laceration of a digestive system organ or structure during a digestive system procedure: Secondary | ICD-10-CM | POA: Diagnosis not present

## 2017-12-28 DIAGNOSIS — N179 Acute kidney failure, unspecified: Secondary | ICD-10-CM | POA: Diagnosis present

## 2017-12-28 DIAGNOSIS — G459 Transient cerebral ischemic attack, unspecified: Secondary | ICD-10-CM | POA: Diagnosis not present

## 2017-12-28 DIAGNOSIS — K254 Chronic or unspecified gastric ulcer with hemorrhage: Secondary | ICD-10-CM | POA: Diagnosis present

## 2017-12-28 DIAGNOSIS — K7460 Unspecified cirrhosis of liver: Secondary | ICD-10-CM | POA: Diagnosis present

## 2017-12-28 DIAGNOSIS — K279 Peptic ulcer, site unspecified, unspecified as acute or chronic, without hemorrhage or perforation: Secondary | ICD-10-CM | POA: Diagnosis not present

## 2017-12-28 DIAGNOSIS — E039 Hypothyroidism, unspecified: Secondary | ICD-10-CM | POA: Diagnosis present

## 2017-12-28 DIAGNOSIS — Z79899 Other long term (current) drug therapy: Secondary | ICD-10-CM | POA: Diagnosis not present

## 2017-12-28 DIAGNOSIS — E860 Dehydration: Secondary | ICD-10-CM | POA: Diagnosis present

## 2017-12-28 DIAGNOSIS — Z794 Long term (current) use of insulin: Secondary | ICD-10-CM | POA: Diagnosis not present

## 2017-12-28 DIAGNOSIS — I69354 Hemiplegia and hemiparesis following cerebral infarction affecting left non-dominant side: Secondary | ICD-10-CM | POA: Diagnosis not present

## 2017-12-28 DIAGNOSIS — Z7901 Long term (current) use of anticoagulants: Secondary | ICD-10-CM | POA: Diagnosis not present

## 2017-12-28 DIAGNOSIS — R188 Other ascites: Secondary | ICD-10-CM | POA: Diagnosis present

## 2017-12-28 DIAGNOSIS — N183 Chronic kidney disease, stage 3 (moderate): Secondary | ICD-10-CM | POA: Diagnosis present

## 2017-12-28 DIAGNOSIS — Y658 Other specified misadventures during surgical and medical care: Secondary | ICD-10-CM | POA: Diagnosis not present

## 2017-12-28 DIAGNOSIS — S1121XD Laceration without foreign body of pharynx and cervical esophagus, subsequent encounter: Secondary | ICD-10-CM | POA: Diagnosis not present

## 2017-12-28 DIAGNOSIS — K922 Gastrointestinal hemorrhage, unspecified: Secondary | ICD-10-CM | POA: Diagnosis present

## 2017-12-28 DIAGNOSIS — D638 Anemia in other chronic diseases classified elsewhere: Secondary | ICD-10-CM | POA: Diagnosis present

## 2017-12-28 DIAGNOSIS — J9811 Atelectasis: Secondary | ICD-10-CM | POA: Diagnosis not present

## 2017-12-28 DIAGNOSIS — I6789 Other cerebrovascular disease: Secondary | ICD-10-CM | POA: Diagnosis not present

## 2017-12-28 DIAGNOSIS — D696 Thrombocytopenia, unspecified: Secondary | ICD-10-CM | POA: Diagnosis present

## 2017-12-28 DIAGNOSIS — Z87891 Personal history of nicotine dependence: Secondary | ICD-10-CM | POA: Diagnosis not present

## 2017-12-28 DIAGNOSIS — D689 Coagulation defect, unspecified: Secondary | ICD-10-CM | POA: Diagnosis present

## 2017-12-28 DIAGNOSIS — K219 Gastro-esophageal reflux disease without esophagitis: Secondary | ICD-10-CM | POA: Diagnosis present

## 2017-12-28 HISTORY — PX: ESOPHAGOGASTRODUODENOSCOPY: SHX5428

## 2017-12-28 LAB — CBC
HCT: 31.9 % — ABNORMAL LOW (ref 35.0–47.0)
Hemoglobin: 10.4 g/dL — ABNORMAL LOW (ref 12.0–16.0)
MCH: 26.4 pg (ref 26.0–34.0)
MCHC: 32.7 g/dL (ref 32.0–36.0)
MCV: 80.7 fL (ref 80.0–100.0)
PLATELETS: 131 10*3/uL — AB (ref 150–440)
RBC: 3.95 MIL/uL (ref 3.80–5.20)
RDW: 20.2 % — ABNORMAL HIGH (ref 11.5–14.5)
WBC: 6.8 10*3/uL (ref 3.6–11.0)

## 2017-12-28 LAB — GLUCOSE, CAPILLARY
GLUCOSE-CAPILLARY: 79 mg/dL (ref 65–99)
Glucose-Capillary: 121 mg/dL — ABNORMAL HIGH (ref 65–99)
Glucose-Capillary: 131 mg/dL — ABNORMAL HIGH (ref 65–99)

## 2017-12-28 LAB — BASIC METABOLIC PANEL
ANION GAP: 4 — AB (ref 5–15)
BUN: 12 mg/dL (ref 6–20)
CO2: 24 mmol/L (ref 22–32)
Calcium: 7.6 mg/dL — ABNORMAL LOW (ref 8.9–10.3)
Chloride: 108 mmol/L (ref 101–111)
Creatinine, Ser: 1.02 mg/dL — ABNORMAL HIGH (ref 0.44–1.00)
GFR calc Af Amer: >60 mL/min (ref >60)
GFR, EST NON AFRICAN AMERICAN: 57 mL/min — AB (ref >60)
GLUCOSE: 94 mg/dL (ref 65–99)
Potassium: 3.8 mmol/L (ref 3.5–5.1)
SODIUM: 136 mmol/L (ref 135–145)

## 2017-12-28 LAB — KOH PREP

## 2017-12-28 IMAGING — CR DG ABDOMEN 2V
3 series · 3 of 3 positions shown · non-contrast
Comparison: Radiograph 01/25/2016, CT 01/22/2016

CLINICAL DATA: Vomiting and diarrhea for 2 days. Upper abdominal
pain.

EXAM:
ABDOMEN - 2 VIEW

[abdomen erect (1 of 2)]
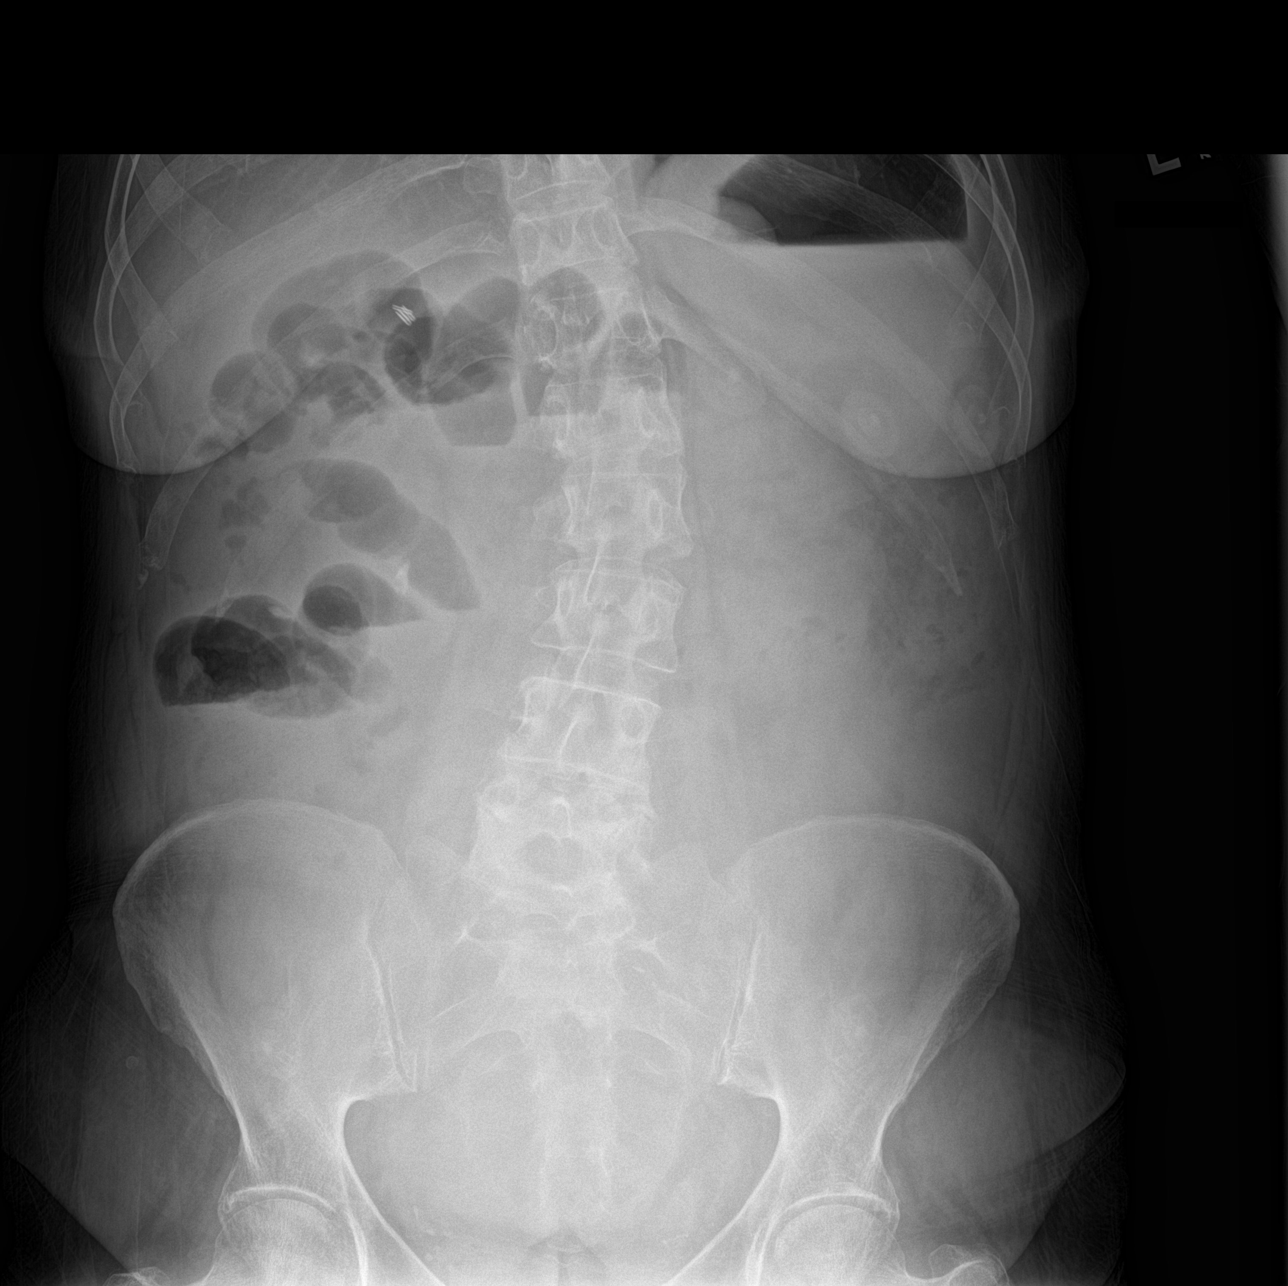

[abdomen erect (2 of 2)]
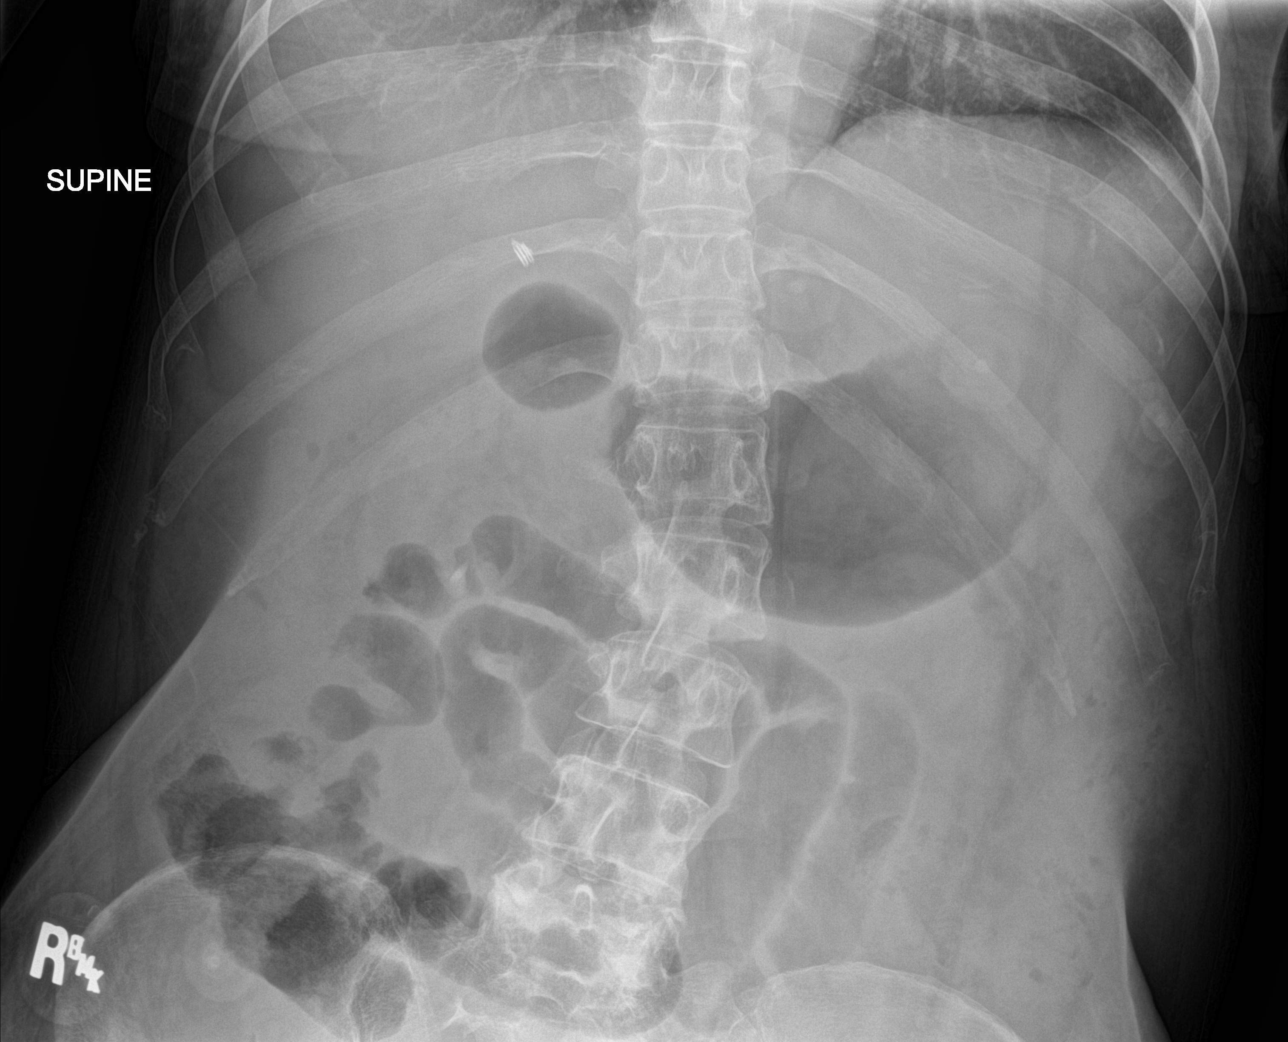

[abdomen supine]
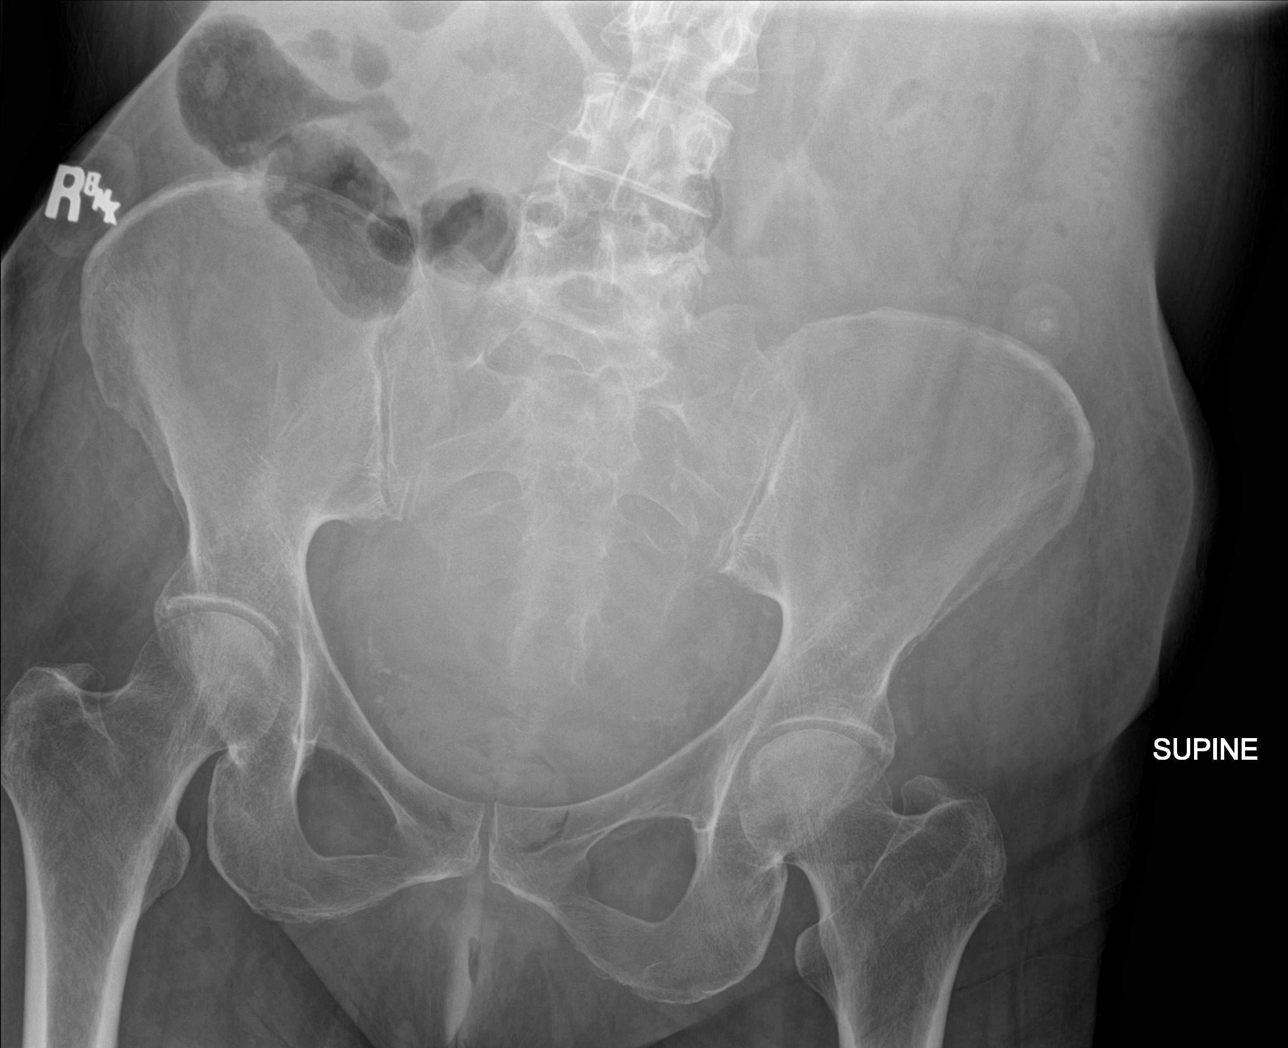

[3 of 3 positions shown; findings below may reference images not displayed]

FINDINGS: Air-fluid level in the stomach. Air within normal caliber small
bowel loops with scattered air-fluid levels. Small volume of stool
in the colon. No free air. Right nephrolithiasis again seen.
IMPRESSION: Air-fluid levels within the stomach and small bowel suggesting
enteritis, no bowel dilatation to suggest obstruction. No free air.

## 2017-12-28 SURGERY — EGD (ESOPHAGOGASTRODUODENOSCOPY)
Anesthesia: General

## 2017-12-28 MED ORDER — CIPROFLOXACIN IN D5W 400 MG/200ML IV SOLN
400.0000 mg | Freq: Two times a day (BID) | INTRAVENOUS | Status: DC
  Administered 2017-12-28 – 2017-12-30 (×5): 400 mg via INTRAVENOUS
  Filled 2017-12-28 (×6): qty 200

## 2017-12-28 MED ORDER — LIDOCAINE HCL (CARDIAC) PF 100 MG/5ML IV SOSY
PREFILLED_SYRINGE | INTRAVENOUS | Status: DC | PRN
  Administered 2017-12-28: 50 mg via INTRAVENOUS

## 2017-12-28 MED ORDER — PROPOFOL 500 MG/50ML IV EMUL
INTRAVENOUS | Status: AC
  Filled 2017-12-28: qty 50

## 2017-12-28 MED ORDER — MIDAZOLAM HCL 2 MG/2ML IJ SOLN
INTRAMUSCULAR | Status: AC
  Filled 2017-12-28: qty 2

## 2017-12-28 MED ORDER — SODIUM CHLORIDE 0.9 % IV SOLN
INTRAVENOUS | Status: DC
  Administered 2017-12-28: 15:00:00 via INTRAVENOUS

## 2017-12-28 MED ORDER — STROKE: EARLY STAGES OF RECOVERY BOOK
Freq: Once | Status: AC
  Administered 2017-12-29

## 2017-12-28 MED ORDER — VANCOMYCIN HCL IN DEXTROSE 1-5 GM/200ML-% IV SOLN
1000.0000 mg | INTRAVENOUS | Status: DC
  Administered 2017-12-28 – 2017-12-29 (×2): 1000 mg via INTRAVENOUS
  Filled 2017-12-28 (×5): qty 200

## 2017-12-28 MED ORDER — PROPOFOL 500 MG/50ML IV EMUL
INTRAVENOUS | Status: DC | PRN
  Administered 2017-12-28: 120 ug/kg/min via INTRAVENOUS

## 2017-12-28 MED ORDER — PROPOFOL 10 MG/ML IV BOLUS
INTRAVENOUS | Status: DC | PRN
  Administered 2017-12-28: 60 mg via INTRAVENOUS

## 2017-12-28 MED ORDER — SODIUM CHLORIDE 0.9 % IV SOLN
8.0000 mg/h | INTRAVENOUS | Status: DC
  Administered 2017-12-28 – 2017-12-29 (×2): 8 mg/h via INTRAVENOUS
  Filled 2017-12-28 (×2): qty 80

## 2017-12-28 MED ORDER — LACTULOSE 10 GM/15ML PO SOLN
20.0000 g | Freq: Two times a day (BID) | ORAL | Status: DC
  Administered 2017-12-28 – 2017-12-30 (×4): 20 g via ORAL
  Filled 2017-12-28 (×5): qty 30

## 2017-12-28 MED ORDER — LIDOCAINE HCL (PF) 2 % IJ SOLN
INTRAMUSCULAR | Status: AC
  Filled 2017-12-28: qty 10

## 2017-12-28 NOTE — Progress Notes (Signed)
[place] [facility] [place] NAME: [name]    MR#:  [id number]  DATE OF BIRTH:  28-Dec-1955  SUBJECTIVE:  Patient seen and evaluated today this morning No new episodes of vomiting of blood or rectal bleed No abdominal pain Had endoscopy today by gastroenterology patient was found to have severe erosive gastritis with ulcers in the antrum and base  patient had a tear in the esophagus during the procedure which was clipped by a gastroenterologist Patient started on IV vancomycin and IV ciprofloxacin antibiotics prophylactically  REVIEW OF SYSTEMS:    ROS  CONSTITUTIONAL: No documented fever. No fatigue, weakness. No weight gain, no weight loss.  EYES: No blurry or double vision.  ENT: No tinnitus. No postnasal drip. No redness of the oropharynx.  RESPIRATORY: No cough, no wheeze, no hemoptysis. No dyspnea.  CARDIOVASCULAR: No chest pain. No orthopnea. No palpitations. No syncope.  GASTROINTESTINAL: No nausea, no vomiting or diarrhea. No abdominal pain. No melena or hematochezia.  GENITOURINARY: No dysuria or hematuria.  ENDOCRINE: No polyuria or nocturia. No heat or cold intolerance.  HEMATOLOGY: No anemia. No bruising. No bleeding.  INTEGUMENTARY: No rashes. No lesions.  MUSCULOSKELETAL: No arthritis. No swelling. No gout.  NEUROLOGIC: No numbness, tingling, or ataxia. No seizure-type activity.  PSYCHIATRIC: No anxiety. No insomnia. No ADD.   DRUG ALLERGIES:   Allergies  Allergen Reactions  . Hydrocodone Other (See Comments)    Pt states that this medication caused cirrhosis of the liver.    . Aspirin   . Erythromycin Other (See Comments)    Reaction:  Fever   . Prednisone Other (See Comments)    Reaction:  Unknown   . Rosiglitazone Maleate Swelling  . Codeine Sulfate Rash  . Tetanus-Diphtheria Toxoids Td Rash and Other (See Comments)    Reaction:  Fever     VITALS:  Blood pressure 119/66, pulse 100, temperature 98.3 F (36.8  C), resp. rate (!) 21, height 5' 3"  (1.6 m), weight 69.9 kg (154 lb 1.6 oz), SpO2 95 %.  PHYSICAL EXAMINATION:   Physical Exam  GENERAL:  62 y.o.-year-old patient lying in the bed with no acute distress.  EYES: Pupils equal, round, reactive to light and accommodation. No scleral icterus. Extraocular muscles intact.  HEENT: Head atraumatic, normocephalic. Oropharynx and nasopharynx clear.  NECK:  Supple, no jugular venous distention. No thyroid enlargement, no tenderness.  LUNGS: Normal breath sounds bilaterally, no wheezing, rales, rhonchi. No use of accessory muscles of respiration.  CARDIOVASCULAR: S1, S2 normal. No murmurs, rubs, or gallops.  ABDOMEN: Soft, nontender, nondistended. Bowel sounds present. No organomegaly or mass.  EXTREMITIES: No cyanosis, clubbing or edema b/l.    NEUROLOGIC: Cranial nerves II through XII are intact. No focal Motor or sensory deficits b/l.   PSYCHIATRIC: The patient is alert and oriented x 3.  SKIN: No obvious rash, lesion, or ulcer.   LABORATORY PANEL:   CBC Recent Labs  Lab 12/28/17 0531  WBC 6.8  HGB 10.4*  HCT 31.9*  PLT 131*   ------------------------------------------------------------------------------------------------------------------ Chemistries  Recent Labs  Lab 12/27/17 1836 12/28/17 0427  NA  --  136  K  --  3.8  CL  --  108  CO2  --  24  GLUCOSE  --  94  BUN  --  12  CREATININE  --  1.02*  CALCIUM  --  7.6*  AST 21  --   ALT 18  --   ALKPHOS 128*  --  BILITOT 0.5  --    ------------------------------------------------------------------------------------------------------------------  Cardiac Enzymes No results for input(s): TROPONINI in the last 168 hours. ------------------------------------------------------------------------------------------------------------------  RADIOLOGY:  Dg Chest Port 1 View  Result Date: 12/28/2017 CLINICAL DATA:  Encounter for esophogeal tear. S/P endoscopy. Patient had  difficulty following instructions. Patient would not take deep inspiration for imaging study. Best images obtainable. EXAM: PORTABLE CHEST - 1 VIEW COMPARISON:  12/14/2017 FINDINGS: Relatively low lung volumes. Some increase in patchy subsegmental atelectasis or consolidation in both lung bases. Heart size upper limits normal. Aortic Atherosclerosis (ICD10-170.0) No definite effusion.  No pneumothorax or pneumomediastinum evident. Visualized bones unremarkable. Surgical clip projects over the region of the distal esophagus. IMPRESSION: 1. Low lung volumes with some increase in bibasilar atelectasis/consolidation. Electronically Signed   By: Lucrezia Europe M.D.   On: 12/28/2017 13:00   US Liver Doppler  Result Date: 12/28/2017 CLINICAL DATA:  Cirrhosis EXAM: DUPLEX ULTRASOUND OF LIVER TECHNIQUE: Color and duplex Doppler ultrasound was performed to evaluate the hepatic in-flow and out-flow vessels. COMPARISON:  CT 08/06/2016 and previous FINDINGS: Portal Vein 0.8 cm diameter. No evidence of occlusion or thrombus. Velocities (all hepatopetal): Main:  21-30 cm/sec Right:  15 cm/sec Left:  19 cm/sec Hepatic Vein Velocities (all hepatofugal): Right:  28 cm/sec Middle:  15 cm/sec Left:  40 cm/sec Intrahepatic IVC patent, 62 cm/second Hepatic Artery Velocity:  83 cm/sec Spleen 10.8 x 5.1 x 11.2 cm. (Volume = 320 cm^3). Splenic Vein Velocity: 27 cm/sec. No evidence of occlusion or thrombus. Varices: None identified Ascites: None IMPRESSION: 1. Normal hepatic vascular Doppler evaluation. Electronically Signed   By: Lucrezia Europe M.D.   On: 12/28/2017 09:33     ASSESSMENT AND PLAN:   62 year old female patient with history of A. fib on Eliquis, cirrhosis of liver, GERD, hiatal hernia, hypertension, hypothyroidism, CVA, cancer of uterus, peptic ulcer was admitted to hospitalist service for melena.  1.  Severe erosive gastritis with ulcers in the antrum and base IV Protonix drip Strict n.p.o. Gastroenterology  follow-up  2.  Tear in the esophagus status post endoscopy has been clipped during the procedure Prophylactically patient started on IV vancomycin and ciprofloxacin antibiotics If patient develops any chest pain , will get Gastrografin swallow study  3.  Paroxysmal atrial fibrillation Eliquis on hold  4.  Cirrhosis of liver Follow-up liver function tests  5.  Anemia Monitor hemoglobin hematocrit    All the records are reviewed and case discussed with Care Management/Social Worker. Management plans discussed with the patient, family and they are in agreement.  CODE STATUS: Full code  DVT Prophylaxis: SCDs  TOTAL TIME TAKING CARE OF THIS PATIENT: 35 minutes.   POSSIBLE D/C IN 1 to 2 DAYS, DEPENDING ON CLINICAL CONDITION.  Saundra Shelling M.D on 12/28/2017 at 2:13 PM  Between 7am to 6pm - Pager - [phone]  After 6pm go to [email or web address] - password [email or web address] [facility]  Office  [phone]  CC: Primary care physician; Patient, No Pcp Per  Note: This dictation was prepared with Dragon dictation along with smaller phrase technology. Any transcriptional errors that result from this process are unintentional.

## 2017-12-28 NOTE — Consult Note (Signed)
Patient ordered vancomycin 1g q 24 hr as empiric abx for esophageal tear. Also ordered cipro 400 IV q 12hr Vancomycin dose is appropriate. Will order trough prior to the 5th dose to assure pt is not supra therapeutic. Goal 15-20 Pt has hx of prolonged Qtc-ordered cipro. Qtc yesterday was slightly prolonged at 489. Pt on no other prolonging agents. K this AM 3.8. Spoke w/ Dr Jasmine Pang and requested K and Mg be checked tomorrow AM. Goal >4, Mg >2. Also recommended checking an EKG tomorrow.  Ramond Dial, Pharm.D, BCPS Clinical Pharmacist

## 2017-12-28 NOTE — Anesthesia Procedure Notes (Signed)
Performed by: Lance Muss, CRNA Pre-anesthesia Checklist: Patient identified, Emergency Drugs available, Suction available, Patient being monitored and Timeout performed Patient Re-evaluated:Patient Re-evaluated prior to induction Oxygen Delivery Method: Nasal cannula Induction Type: IV induction

## 2017-12-28 NOTE — Progress Notes (Addendum)
Called by nursing that patient developed left sided weakness while ambulating to bathroom.  On exam patient has left hemiparesis.  3+ to 4-/5 strength left upper extremity, and left lower extremity paralysis with sensory deficit to light touch.  Stat CT head ordered, along with other appropriate imaging, including MRI, and consults per stroke order set.  Patient is here for GI bleed and is therefore not a tPA candidate.   Update - CT head normal for age, alerted by nursing that patient had fever earlier in shift.  Started on empiric antibiotics earlier today after esophageal tear during EGD.  Will send UA and Blood cultures and get AM labs now to screen for other possible infectious source.  CXR tonight did not show PNA.  Per nursing NIH score improving, though still strong clinical suspicion for stroke.  Jacqulyn Bath [facility] 12/28/2017, 11:30 PM

## 2017-12-28 NOTE — Transfer of Care (Signed)
Immediate Anesthesia Transfer of Care Note  Patient: [name]  Procedure(s) Performed: ESOPHAGOGASTRODUODENOSCOPY (EGD) (N/A )  Patient Location: PACU  Anesthesia Type:General  Level of Consciousness: sedated and responds to stimulation  Airway & Oxygen Therapy: Patient Spontanous Breathing and Patient connected to nasal cannula oxygen  Post-op Assessment: Report given to RN and Post -op Vital signs reviewed and stable  Post vital signs: Reviewed and stable  Last Vitals:  Vitals Value Taken Time  BP 113/67 12/28/2017 12:19 PM  Temp    Pulse 107 12/28/2017 12:19 PM  Resp 24 12/28/2017 12:19 PM  SpO2 98 % 12/28/2017 12:19 PM    Last Pain:  Vitals:   12/28/17 1141  TempSrc: Tympanic  PainSc:          Complications: No apparent anesthesia complications

## 2017-12-28 NOTE — Anesthesia Postprocedure Evaluation (Signed)
Anesthesia Post Note  Patient: [name]  Procedure(s) Performed: ESOPHAGOGASTRODUODENOSCOPY (EGD) (N/A )  Patient location during evaluation: Endoscopy Anesthesia Type: General Level of consciousness: awake and alert Pain management: pain level controlled Vital Signs Assessment: post-procedure vital signs reviewed and stable Respiratory status: spontaneous breathing, nonlabored ventilation, respiratory function stable and patient connected to nasal cannula oxygen Cardiovascular status: blood pressure returned to baseline and stable Postop Assessment: no apparent nausea or vomiting Anesthetic complications: no     Last Vitals:  Vitals:   12/28/17 1258 12/28/17 1310  BP: 117/65 119/66  Pulse: (!) 101 100  Resp: 20 (!) 21  Temp:    SpO2: 95% 95%    Last Pain:  Vitals:   12/28/17 1141  TempSrc: Tympanic  PainSc:                  Sai Moura S

## 2017-12-28 NOTE — Anesthesia Preprocedure Evaluation (Addendum)
Anesthesia Evaluation  Patient identified by MRN, date of birth, ID band Patient awake    Reviewed: Allergy & Precautions, NPO status , Patient's Chart, lab work & pertinent test results, reviewed documented beta blocker date and time   Airway Mallampati: II  TM Distance: >3 FB     Dental  (+) Chipped   Pulmonary pneumonia, resolved, former smoker,           Cardiovascular hypertension, Pt. on medications and Pt. on home beta blockers + Past MI and +CHF  + dysrhythmias Atrial Fibrillation      Neuro/Psych PSYCHIATRIC DISORDERS Anxiety Depression TIA Neuromuscular disease CVA    GI/Hepatic hiatal hernia, PUD, GERD  ,  Endo/Other  diabetes, Type 1Hypothyroidism   Renal/GU      Musculoskeletal  (+) Arthritis ,   Abdominal   Peds  Hematology  (+) anemia ,   Anesthesia Other Findings Ascites ok. Pt denies stroke or weakness, although her L side seems weaker. On eliquis. Will proceed. Echo ok 63.  Reproductive/Obstetrics                            Anesthesia Physical Anesthesia Plan  ASA: II  Anesthesia Plan: General   Post-op Pain Management:    Induction: Intravenous  PONV Risk Score and Plan:   Airway Management Planned:   Additional Equipment:   Intra-op Plan:   Post-operative Plan:   Informed Consent: I have reviewed the patients History and Physical, chart, labs and discussed the procedure including the risks, benefits and alternatives for the proposed anesthesia with the patient or authorized representative who has indicated his/her understanding and acceptance.     Plan Discussed with: CRNA  Anesthesia Plan Comments:         Anesthesia Quick Evaluation

## 2017-12-28 NOTE — Op Note (Signed)
[facility] Gastroenterology Patient Name: [name] Procedure Date: 12/28/2017 11:38 AM MRN: [id number] Account #: [id number] Date of Birth: 1955/10/10 Admit Type: Inpatient Age: 62 Room: [facility] ENDO ROOM 4 Gender: Female Note Status: Finalized Procedure:            Upper GI endoscopy Indications:          Periumbilical abdominal pain, Melena Providers:            Lin Landsman MD, MD Medicines:            Monitored Anesthesia Care Complications:        Tear, treated with placement of hemostatic clip Procedure:            Pre-Anesthesia Assessment:                       - Prior to the procedure, a History and Physical was                        performed, and patient medications and allergies were                        reviewed. The patient is unable to give consent                        secondary to the patient's altered mental status. The                        risks and benefits of the procedure and the sedation                        options and risks were discussed with the patient's                        spouse. All questions were answered and informed                        consent was obtained. Patient identification and                        proposed procedure were verified by the physician, the                        nurse, the anesthesiologist, the anesthetist and the                        technician in the pre-procedure area in the procedure                        room in the endoscopy suite. Mental Status Examination:                        alert and oriented. Airway Examination: normal                        oropharyngeal airway and neck mobility. Respiratory                        Examination: clear to auscultation. CV Examination:  normal. Prophylactic Antibiotics: The patient does not                        require prophylactic antibiotics. Prior Anticoagulants:                        The patient has taken no  previous anticoagulant or                        antiplatelet agents. ASA Grade Assessment: III - A                        patient with severe systemic disease. After reviewing                        the risks and benefits, the patient was deemed in                        satisfactory condition to undergo the procedure. The                        anesthesia plan was to use monitored anesthesia care                        (MAC). Immediately prior to administration of                        medications, the patient was re-assessed for adequacy                        to receive sedatives. The heart rate, respiratory rate,                        oxygen saturations, blood pressure, adequacy of                        pulmonary ventilation, and response to care were                        monitored throughout the procedure. The physical status                        of the patient was re-assessed after the procedure.                       After obtaining informed consent, the endoscope was                        passed under direct vision. Throughout the procedure,                        the patient's blood pressure, pulse, and oxygen                        saturations were monitored continuously. The Endoscope                        was introduced through the mouth, and advanced to the  second part of duodenum. The upper GI endoscopy was                        accomplished without difficulty. The patient tolerated                        the procedure fairly well. Findings:      The duodenal bulb and second portion of the duodenum were normal.      Many non-bleeding cratered gastric ulcers with a clean ulcer base       (Forrest Class III) were found in the gastric antrum. The largest lesion       was 5 mm in largest dimension.      Diffuse severe inflammation characterized by adherent blood, congestion       (edema) and erythema was found in the gastric body. Biopsies were  taken       with a cold forceps for Helicobacter pylori testing.      LA Grade D (one or more mucosal breaks involving at least 75% of       esophageal circumference) esophagitis with no bleeding was found in the       distal esophagus. While performing the brushing, it resulted in       superficial tear at 30cm from incisors, there was some bleeding which       spontaneously stopped. To repair the defect, the tissue edges were       approximated and one hemostatic clip was successfully placed. Closure of       the defect was successful. There was no bleeding at the end of the       procedure. No crepitus felt in neck or upper thorax. Pt was oxygenating       well during and by end of procedure. Impression:           - Normal duodenal bulb and second portion of the                        duodenum.                       - Non-bleeding gastric ulcers with a clean ulcer base                        (Forrest Class III).                       - Acute gastritis. Biopsied.                       - LA Grade D erosive esophagitis. Brushings obtained.                        Clip was placed to close the tear. Recommendation:       - Return patient to hospital ward for ongoing care.                       - NPO today.                       - Use Protonix (pantoprazole) 40 mg IV BID.                       -  Empiric antibiotics                       - CXR stat Procedure Code(s):    --- Professional ---                       [phone], Esophagogastroduodenoscopy, flexible, transoral;                        with biopsy, single or multiple Diagnosis Code(s):    --- Professional ---                       K25.9, Gastric ulcer, unspecified as acute or chronic,                        without hemorrhage or perforation                       K29.00, Acute gastritis without bleeding                       K20.8, Other esophagitis                       S14.23, Periumbilical pain                       K92.1, Melena  (includes Hematochezia) CPT copyright 2017 [employer]. All rights reserved. The codes documented in this report are preliminary and upon coder review may  be revised to meet current compliance requirements. Dr. Ulyess Mort Lin Landsman MD, MD 12/28/2017 12:26:50 PM This report has been signed electronically. Number of Addenda: 0 Note Initiated On: 12/28/2017 11:38 AM      [facility]

## 2017-12-28 NOTE — Progress Notes (Signed)
Chaplain responded to a rapid response. No family. Cplain prayed for care team and Pt silently.    12/28/17 2300  Clinical Encounter Type  Visited With Patient  Visit Type Initial;Spiritual support;Code  Referral From Nurse  Spiritual Encounters  Spiritual Needs Prayer

## 2017-12-28 NOTE — Anesthesia Post-op Follow-up Note (Signed)
Anesthesia QCDR form completed.        

## 2017-12-28 NOTE — Plan of Care (Signed)
  Problem: Education: Goal: Knowledge of General Education information will improve Outcome: Progressing   Problem: Metabolic: Goal: Ability to maintain appropriate glucose levels will improve Outcome: Progressing

## 2017-12-29 ENCOUNTER — Inpatient Hospital Stay: Payer: Medicare HMO

## 2017-12-29 ENCOUNTER — Inpatient Hospital Stay (HOSPITAL_COMMUNITY)
Admit: 2017-12-29 | Discharge: 2017-12-29 | Disposition: A | Discharge status: Home / Self Care | Payer: Medicare HMO | Attending: Internal Medicine | Admitting: Internal Medicine

## 2017-12-29 DIAGNOSIS — S1121XD Laceration without foreign body of pharynx and cervical esophagus, subsequent encounter: Secondary | ICD-10-CM

## 2017-12-29 DIAGNOSIS — K766 Portal hypertension: Secondary | ICD-10-CM

## 2017-12-29 DIAGNOSIS — K3189 Other diseases of stomach and duodenum: Secondary | ICD-10-CM

## 2017-12-29 DIAGNOSIS — K7290 Hepatic failure, unspecified without coma: Secondary | ICD-10-CM

## 2017-12-29 DIAGNOSIS — I6789 Other cerebrovascular disease: Secondary | ICD-10-CM

## 2017-12-29 DIAGNOSIS — G459 Transient cerebral ischemic attack, unspecified: Secondary | ICD-10-CM

## 2017-12-29 DIAGNOSIS — K279 Peptic ulcer, site unspecified, unspecified as acute or chronic, without hemorrhage or perforation: Secondary | ICD-10-CM

## 2017-12-29 LAB — URINALYSIS, COMPLETE (UACMP) WITH MICROSCOPIC
BILIRUBIN URINE: NEGATIVE
Glucose, UA: 150 mg/dL — AB
KETONES UR: 5 mg/dL — AB
NITRITE: NEGATIVE
PROTEIN: 30 mg/dL — AB
Specific Gravity, Urine: 1.015 (ref 1.005–1.030)
pH: 5 (ref 5.0–8.0)

## 2017-12-29 LAB — CBC
HEMATOCRIT: 26.5 % — AB (ref 35.0–47.0)
Hemoglobin: 8.5 g/dL — ABNORMAL LOW (ref 12.0–16.0)
MCH: 25.1 pg — AB (ref 26.0–34.0)
MCHC: 32.1 g/dL (ref 32.0–36.0)
MCV: 78.1 fL — ABNORMAL LOW (ref 80.0–100.0)
PLATELETS: 138 10*3/uL — AB (ref 150–440)
RBC: 3.39 MIL/uL — ABNORMAL LOW (ref 3.80–5.20)
RDW: 20.1 % — AB (ref 11.5–14.5)
WBC: 7.8 10*3/uL (ref 3.6–11.0)

## 2017-12-29 LAB — AFP TUMOR MARKER: AFP, Serum, Tumor Marker: 1.7 ng/mL (ref 0.0–8.3)

## 2017-12-29 LAB — LIPID PANEL
Cholesterol: 66 mg/dL (ref 0–200)
HDL: 13 mg/dL — AB (ref >40)
LDL CALC: 32 mg/dL (ref 0–99)
TRIGLYCERIDES: 107 mg/dL (ref <150)
Total CHOL/HDL Ratio: 5.1 RATIO
VLDL: 21 mg/dL (ref 0–40)

## 2017-12-29 LAB — HEPATITIS PANEL, ACUTE
HCV AB: 0.9 {s_co_ratio} (ref 0.0–0.9)
HEP A IGM: NEGATIVE
HEP B C IGM: NEGATIVE
HEP B S AG: NEGATIVE

## 2017-12-29 LAB — BASIC METABOLIC PANEL
ANION GAP: 4 — AB (ref 5–15)
BUN: 12 mg/dL (ref 6–20)
CALCIUM: 6.9 mg/dL — AB (ref 8.9–10.3)
CO2: 20 mmol/L — AB (ref 22–32)
Chloride: 110 mmol/L (ref 101–111)
Creatinine, Ser: 1.03 mg/dL — ABNORMAL HIGH (ref 0.44–1.00)
GFR calc Af Amer: >60 mL/min (ref >60)
GFR, EST NON AFRICAN AMERICAN: 56 mL/min — AB (ref >60)
GLUCOSE: 200 mg/dL — AB (ref 65–99)
POTASSIUM: 3.8 mmol/L (ref 3.5–5.1)
Sodium: 134 mmol/L — ABNORMAL LOW (ref 135–145)

## 2017-12-29 LAB — GLUCOSE, CAPILLARY
GLUCOSE-CAPILLARY: 142 mg/dL — AB (ref 65–99)
GLUCOSE-CAPILLARY: 103 mg/dL — AB (ref 65–99)
GLUCOSE-CAPILLARY: 254 mg/dL — AB (ref 65–99)

## 2017-12-29 LAB — ECHOCARDIOGRAM COMPLETE
Height: 63 in
Weight: 2465.6 oz

## 2017-12-29 LAB — HEMOGLOBIN A1C
HEMOGLOBIN A1C: 14.1 % — AB (ref 4.8–5.6)
Mean Plasma Glucose: 357.97 mg/dL

## 2017-12-29 LAB — MAGNESIUM: Magnesium: 1.3 mg/dL — ABNORMAL LOW (ref 1.7–2.4)

## 2017-12-29 MED ORDER — POLYETHYLENE GLYCOL 3350 17 G PO PACK
17.0000 g | PACK | Freq: Two times a day (BID) | ORAL | Status: DC
  Administered 2017-12-30: 17 g via ORAL
  Filled 2017-12-29 (×2): qty 1

## 2017-12-29 MED ORDER — GADOBENATE DIMEGLUMINE 529 MG/ML IV SOLN
14.0000 mL | Freq: Once | INTRAVENOUS | Status: AC | PRN
  Administered 2017-12-29: 13:00:00 14 mL via INTRAVENOUS

## 2017-12-29 MED ORDER — APIXABAN 5 MG PO TABS
5.0000 mg | ORAL_TABLET | Freq: Two times a day (BID) | ORAL | Status: DC
  Administered 2017-12-29 – 2017-12-30 (×3): 5 mg via ORAL
  Filled 2017-12-29 (×3): qty 1

## 2017-12-29 MED ORDER — POTASSIUM CHLORIDE 20 MEQ/15ML (10%) PO SOLN
20.0000 meq | Freq: Once | ORAL | Status: AC
  Administered 2017-12-29: 20 meq via ORAL
  Filled 2017-12-29: qty 15

## 2017-12-29 MED ORDER — MAGNESIUM SULFATE 4 GM/100ML IV SOLN
4.0000 g | Freq: Once | INTRAVENOUS | Status: AC
  Administered 2017-12-29: 4 g via INTRAVENOUS
  Filled 2017-12-29 (×2): qty 100

## 2017-12-29 MED ORDER — PANTOPRAZOLE SODIUM 40 MG IV SOLR
40.0000 mg | Freq: Two times a day (BID) | INTRAVENOUS | Status: DC
  Administered 2017-12-29 – 2017-12-30 (×2): 40 mg via INTRAVENOUS
  Filled 2017-12-29 (×2): qty 40

## 2017-12-29 MED ORDER — SODIUM CHLORIDE 0.9 % IV SOLN
50.0000 ug/h | INTRAVENOUS | Status: DC
  Administered 2017-12-29 – 2017-12-30 (×2): 50 ug/h via INTRAVENOUS
  Filled 2017-12-29 (×6): qty 1

## 2017-12-29 MED ORDER — FUROSEMIDE 40 MG PO TABS
40.0000 mg | ORAL_TABLET | Freq: Every day | ORAL | Status: DC
  Administered 2017-12-29 – 2017-12-30 (×2): 40 mg via ORAL
  Filled 2017-12-29 (×3): qty 1

## 2017-12-29 MED ORDER — GLUCERNA SHAKE PO LIQD: 237.0000 mL | Freq: Three times a day (TID) | ORAL | Status: DC

## 2017-12-29 MED ORDER — FLUCONAZOLE 200 MG PO TABS
200.0000 mg | ORAL_TABLET | Freq: Every day | ORAL | Status: DC
  Administered 2017-12-30: 200 mg via ORAL
  Filled 2017-12-29: qty 1

## 2017-12-29 MED ORDER — SPIRONOLACTONE 25 MG PO TABS
100.0000 mg | ORAL_TABLET | Freq: Every day | ORAL | Status: DC
  Administered 2017-12-29 – 2017-12-30 (×2): 100 mg via ORAL
  Filled 2017-12-29 (×3): qty 4

## 2017-12-29 MED ORDER — FLUCONAZOLE 100 MG PO TABS
400.0000 mg | ORAL_TABLET | Freq: Once | ORAL | Status: AC
  Administered 2017-12-29: 400 mg via ORAL
  Filled 2017-12-29: qty 2

## 2017-12-29 NOTE — Progress Notes (Addendum)
Rita Darby, MD [address]  [place]  [place], [place] [address]  Main: [phone]  Fax: [phone] Pager: [phone]   Subjective: Patient developed worsening of left-sided weakness and underwent INH stroke protocol. CT head and MRI head negative for CVA. She reports that her weakness is improving. She reports a burning pain in her chest when she tries to swallow. She denies shortness of breath, chest pain. She is empirically started on broad spectrum antibiotics for esophageal tear during upper endoscopy yesterday. She is tolerating full liquid diet. On Protonix and octreotide drips and started on Eliquis   Objective: Vital signs in last 24 hours: Vitals:   12/29/17 0314 12/29/17 1104 12/29/17 1354 12/29/17 1907  BP: 118/73 (!) 128/92 129/73 102/75  Pulse: 91 99 96   Resp: _0 Temp: 98.1 F (36.7 C) 98.5 F (36.9 C) 98.3 F (36.8 C)   TempSrc: Oral Oral Oral   SpO2: 96% 98% 100%   Weight:      Height:       Weight change:   Intake/Output Summary (Last 24 hours) at 12/29/2017 1918 Last data filed at 12/29/2017 0531 Gross per 24 hour  Intake 1710.24 ml  Output -  Net 1710.24 ml     Exam: Heart:: Regular rate and rhythm or S1S2 present Lungs: clear to auscultation Abdomen: soft, nontender, mildly distended, normal bowel sounds 2+ pitting edema in bilateral extremities   Lab Results: CBC Latest Ref Rng & Units 12/29/2017 12/28/2017 12/27/2017  WBC 3.6 - 11.0 K/uL 7.8 6.8 5.8  Hemoglobin 12.0 - 16.0 g/dL 8.5(L) 10.4(L) 8.7(L)  Hematocrit 35.0 - 47.0 % 26.5(L) 31.9(L) 27.4(L)  Platelets 150 - 440 K/uL 138(L) 131(L) 131(L)   BMP Latest Ref Rng & Units 12/29/2017 12/28/2017 12/27/2017  Glucose 65 - 99 mg/dL 200(H) 94 456(H)  BUN 6 - 20 mg/dL _1 Creatinine 0.44 - 1.00 mg/dL 1.03(H) 1.02(H) 1.37(H)  Sodium 135 - 145 mmol/L 134(L) 136 131(L)  Potassium 3.5 - 5.1 mmol/L 3.8 3.8 3.8  Chloride 101 - 111 mmol/L 110 108 101  CO2 22 - 32  mmol/L 20(L) 24 22  Calcium 8.9 - 10.3 mg/dL 6.9(L) 7.6(L) 7.9(L)   Hepatic Function Latest Ref Rng & Units 12/27/2017 12/14/2017 08/06/2016  Total Protein 6.5 - 8.1 g/dL 5.7(L) 7.1 6.1(L)  Albumin 3.5 - 5.0 g/dL 2.0(L) 2.6(L) 3.4(L)  AST 15 - 41 U/L _2 ALT 14 - 54 U/L _3 Alk Phosphatase 38 - 126 U/L 128(H) 176(H) 93  Total Bilirubin 0.3 - 1.2 mg/dL 0.5 1.0 1.1  Bilirubin, Direct 0.1 - 0.5 mg/dL 0.2 - -   Micro Results: Recent Results (from the past 240 hour(s))  KOH prep     Status: None   Collection Time: 12/28/17 12:30 PM  Result Value Ref Range Status   Specimen Description ESOPHAGUS  Final   Special Requests NONE  Final   KOH Prep   Final    YEAST WITH PSEUDOHYPHAE Performed at [facility], [address] [place],  [address]    Report Status 12/28/2017 FINAL  Final  Culture, blood (Routine X 2) w Reflex to ID Panel     Status: None (Preliminary result)   Collection Time: 12/29/17  2:12 AM  Result Value Ref Range Status   Specimen Description BLOOD BLOOD RIGHT HAND  Final   Special Requests   Final    BOTTLES DRAWN AEROBIC AND ANAEROBIC Blood  Culture adequate volume   Culture   Final    NO GROWTH < 12 HOURS Performed at [facility], [address] [place], [place] [address]    Report Status PENDING  Incomplete  Culture, blood (Routine X 2) w Reflex to ID Panel     Status: None (Preliminary result)   Collection Time: 12/29/17  2:23 AM  Result Value Ref Range Status   Specimen Description BLOOD BLOOD LEFT HAND  Final   Special Requests   Final    BOTTLES DRAWN AEROBIC AND ANAEROBIC Blood Culture results may not be optimal due to an inadequate volume of blood received in culture bottles   Culture   Final    NO GROWTH < 12 HOURS Performed at [facility], [address], [place], [place] [address]    Report Status PENDING  Incomplete   Studies/Results: Ct Head Wo Contrast  Result Date: 12/29/2017 CLINICAL DATA:   Sudden onset left-sided weakness. EXAM: CT HEAD WITHOUT CONTRAST TECHNIQUE: Contiguous axial images were obtained from the base of the skull through the vertex without intravenous contrast. COMPARISON:  12/15/2017 head CT FINDINGS: Brain: No mass lesion, intraparenchymal hemorrhage or extra-axial collection. No evidence of acute cortical infarct. Normal appearance of the brain parenchyma and extra axial spaces for age. Vascular: No hyperdense vessel or unexpected vascular calcification. Skull: Normal visualized skull base, calvarium and extracranial soft tissues. Sinuses/Orbits: No sinus fluid levels or advanced mucosal thickening. No mastoid effusion. Normal orbits. IMPRESSION: Normal aging brain. Electronically Signed   By: Ulyses Jarred M.D.   On: 12/29/2017 00:31   Mr Jodene Nam Neck W Contrast  Result Date: 12/29/2017 CLINICAL DATA:  Left hemiparesis. EXAM: MR HEAD WITHOUT CONTRAST MR CIRCLE OF WILLIS WITHOUT CONTRAST MRA OF THE NECK WITHOUT AND WITH CONTRAST TECHNIQUE: Multiplanar, multiecho pulse sequences of the brain, circle of willis and surrounding structures were obtained without intravenous contrast. Angiographic images of the neck were obtained using MRA technique without and with intravenous contrast. CONTRAST:  55m MULTIHANCE GADOBENATE DIMEGLUMINE 529 MG/ML IV SOLN COMPARISON:  Head CT 12/29/2017. Brain MRI 12/29/2015. Carotid Doppler ultrasound 02/16/2015. FINDINGS: MR HEAD FINDINGS Brain: There is no evidence of acute infarct, intracranial hemorrhage, mass, midline shift, or extra-axial fluid collection. Mild cerebral atrophy is not greater than expected for age. Scattered small foci of cerebral white matter T2 hyperintensity have slightly progressed from the prior MRI and are nonspecific but compatible with mild chronic small vessel ischemic disease. A chronic right periatrial white matter lacunar infarct is new from the prior MRI. There are also a few new subcentimeter chronic right cerebellar  infarcts. Vascular: Major intracranial vascular flow voids are preserved. Skull and upper cervical spine: Unremarkable bone marrow signal. Sinuses/Orbits: Unremarkable orbits. Trace bilateral mastoid effusions. Clear paranasal sinuses. Other: None. MR CIRCLE OF WILLIS FINDINGS The visualized distal vertebral arteries are widely patent to the basilar. Patent right PICA, left AICA, and bilateral SCA origins are identified. The basilar artery is widely patent. There is a large right posterior communicating artery with diminutive right P1 segment. Both PCAs are patent without evidence of significant proximal stenosis. There is some asymmetric attenuation of distal right PCA branch vessels. The internal carotid arteries are patent from skull base to carotid termini without evidence of stenosis. ACAs and MCAs are patent without evidence of proximal branch occlusion or significant proximal stenosis. No aneurysm is identified. MRA NECK FINDINGS There is a standard 3 vessel aortic arch. The brachiocephalic and subclavian arteries are widely patent. The carotid  arteries are patent without evidence of dissection or stenosis. The vertebral arteries are patent and codominant with antegrade flow bilaterally and no evidence of dissection or significant stenosis. IMPRESSION: 1. No acute intracranial abnormality. 2. Mild chronic small vessel ischemic disease. 3. Small chronic cerebellar infarcts, new from 2017. 4. Patent circle of Willis without significant proximal stenosis. 5. Negative neck MRA. Electronically Signed   By: Logan Bores M.D.   On: 12/29/2017 13:10   Mr Brain Wo Contrast  Result Date: 12/29/2017 CLINICAL DATA:  Left hemiparesis. EXAM: MR HEAD WITHOUT CONTRAST MR CIRCLE OF WILLIS WITHOUT CONTRAST MRA OF THE NECK WITHOUT AND WITH CONTRAST TECHNIQUE: Multiplanar, multiecho pulse sequences of the brain, circle of willis and surrounding structures were obtained without intravenous contrast. Angiographic images of the  neck were obtained using MRA technique without and with intravenous contrast. CONTRAST:  27m MULTIHANCE GADOBENATE DIMEGLUMINE 529 MG/ML IV SOLN COMPARISON:  Head CT 12/29/2017. Brain MRI 12/29/2015. Carotid Doppler ultrasound 02/16/2015. FINDINGS: MR HEAD FINDINGS Brain: There is no evidence of acute infarct, intracranial hemorrhage, mass, midline shift, or extra-axial fluid collection. Mild cerebral atrophy is not greater than expected for age. Scattered small foci of cerebral white matter T2 hyperintensity have slightly progressed from the prior MRI and are nonspecific but compatible with mild chronic small vessel ischemic disease. A chronic right periatrial white matter lacunar infarct is new from the prior MRI. There are also a few new subcentimeter chronic right cerebellar infarcts. Vascular: Major intracranial vascular flow voids are preserved. Skull and upper cervical spine: Unremarkable bone marrow signal. Sinuses/Orbits: Unremarkable orbits. Trace bilateral mastoid effusions. Clear paranasal sinuses. Other: None. MR CIRCLE OF WILLIS FINDINGS The visualized distal vertebral arteries are widely patent to the basilar. Patent right PICA, left AICA, and bilateral SCA origins are identified. The basilar artery is widely patent. There is a large right posterior communicating artery with diminutive right P1 segment. Both PCAs are patent without evidence of significant proximal stenosis. There is some asymmetric attenuation of distal right PCA branch vessels. The internal carotid arteries are patent from skull base to carotid termini without evidence of stenosis. ACAs and MCAs are patent without evidence of proximal branch occlusion or significant proximal stenosis. No aneurysm is identified. MRA NECK FINDINGS There is a standard 3 vessel aortic arch. The brachiocephalic and subclavian arteries are widely patent. The carotid arteries are patent without evidence of dissection or stenosis. The vertebral arteries are  patent and codominant with antegrade flow bilaterally and no evidence of dissection or significant stenosis. IMPRESSION: 1. No acute intracranial abnormality. 2. Mild chronic small vessel ischemic disease. 3. Small chronic cerebellar infarcts, new from 2017. 4. Patent circle of Willis without significant proximal stenosis. 5. Negative neck MRA. Electronically Signed   By: ALogan BoresM.D.   On: 12/29/2017 13:10   [place]otid Bilateral  Result Date: 12/29/2017 CLINICAL DATA:  62year old female with stroke-like symptoms EXAM: BILATERAL CAROTID DUPLEX ULTRASOUND TECHNIQUE: GPearline Cablesscale imaging, color Doppler and duplex ultrasound were performed of bilateral carotid and vertebral arteries in the neck. COMPARISON:  Brain MRI 12/30/2007 FINDINGS: Criteria: Quantification of carotid stenosis is based on velocity parameters that correlate the residual internal carotid diameter with NASCET-based stenosis levels, using the diameter of the distal internal carotid lumen as the denominator for stenosis measurement. The following velocity measurements were obtained: RIGHT ICA: 49/21 cm/sec CCA: 455/37cm/sec SYSTOLIC ICA/CCA RATIO:  1.2 ECA:  47 cm/sec LEFT ICA: 87/31 cm/sec CCA: 348/27cm/sec SYSTOLIC ICA/CCA RATIO:  2.5 ECA:  54 cm/sec RIGHT CAROTID ARTERY: Trace heterogeneous atherosclerotic plaque in the distal common carotid artery. No significant plaque or evidence of stenosis in the internal carotid artery. RIGHT VERTEBRAL ARTERY:  Patent with normal antegrade flow. LEFT CAROTID ARTERY: Mild heterogeneous atherosclerotic plaque in the distal common carotid artery. No evidence of significant plaque or stenosis in the internal carotid artery. LEFT VERTEBRAL ARTERY:  Patent with normal antegrade flow. IMPRESSION: 1. No evidence of significant atherosclerotic plaque or stenosis in either internal carotid artery. 2. Mild heterogeneous atherosclerotic plaque in the distal bilateral common carotid arteries. 3. Vertebral arteries  are patent with normal antegrade flow. Signed, Criselda Peaches, MD [facility] Electronically Signed   By: Jacqulynn Cadet M.D.   On: 12/29/2017 14:15   Dg Chest Port 1 View  Result Date: 12/28/2017 CLINICAL DATA:  Central chest pain EXAM: PORTABLE CHEST 1 VIEW COMPARISON:  12/28/2017 FINDINGS: Low lung volumes. Left basilar atelectasis or infiltrate slightly improved. Improving airspace opacity in the right lung base. No visible effusions. Heart is normal size. IMPRESSION: Improving bibasilar opacities. Residual left basilar atelectasis or infiltrate. Electronically Signed   By: Rolm Baptise M.D.   On: 12/28/2017 23:44   Dg Chest Port 1 View  Result Date: 12/28/2017 CLINICAL DATA:  Encounter for esophogeal tear. S/P endoscopy. Patient had difficulty following instructions. Patient would not take deep inspiration for imaging study. Best images obtainable. EXAM: PORTABLE CHEST - 1 VIEW COMPARISON:  12/14/2017 FINDINGS: Relatively low lung volumes. Some increase in patchy subsegmental atelectasis or consolidation in both lung bases. Heart size upper limits normal. Aortic Atherosclerosis (ICD10-170.0) No definite effusion.  No pneumothorax or pneumomediastinum evident. Visualized bones unremarkable. Surgical clip projects over the region of the distal esophagus. IMPRESSION: 1. Low lung volumes with some increase in bibasilar atelectasis/consolidation. Electronically Signed   By: Lucrezia Europe M.D.   On: 12/28/2017 13:00   Mr Jodene Nam Head Wo Contrast  Result Date: 12/29/2017 CLINICAL DATA:  Left hemiparesis. EXAM: MR HEAD WITHOUT CONTRAST MR CIRCLE OF WILLIS WITHOUT CONTRAST MRA OF THE NECK WITHOUT AND WITH CONTRAST TECHNIQUE: Multiplanar, multiecho pulse sequences of the brain, circle of willis and surrounding structures were obtained without intravenous contrast. Angiographic images of the neck were obtained using MRA technique without and with  intravenous contrast. CONTRAST:  14m MULTIHANCE GADOBENATE DIMEGLUMINE 529 MG/ML IV SOLN COMPARISON:  Head CT 12/29/2017. Brain MRI 12/29/2015. Carotid Doppler ultrasound 02/16/2015. FINDINGS: MR HEAD FINDINGS Brain: There is no evidence of acute infarct, intracranial hemorrhage, mass, midline shift, or extra-axial fluid collection. Mild cerebral atrophy is not greater than expected for age. Scattered small foci of cerebral white matter T2 hyperintensity have slightly progressed from the prior MRI and are nonspecific but compatible with mild chronic small vessel ischemic disease. A chronic right periatrial white matter lacunar infarct is new from the prior MRI. There are also a few new subcentimeter chronic right cerebellar infarcts. Vascular: Major intracranial vascular flow voids are preserved. Skull and upper cervical spine: Unremarkable bone marrow signal. Sinuses/Orbits: Unremarkable orbits. Trace bilateral mastoid effusions. Clear paranasal sinuses. Other: None. MR CIRCLE OF WILLIS FINDINGS The visualized distal vertebral arteries are widely patent to the basilar. Patent right PICA, left AICA, and bilateral SCA origins are identified. The basilar artery is widely patent. There is a large right posterior communicating artery with diminutive right P1 segment. Both PCAs are patent without evidence of significant proximal stenosis. There is some asymmetric attenuation of distal right PCA branch vessels. The internal  carotid arteries are patent from skull base to carotid termini without evidence of stenosis. ACAs and MCAs are patent without evidence of proximal branch occlusion or significant proximal stenosis. No aneurysm is identified. MRA NECK FINDINGS There is a standard 3 vessel aortic arch. The brachiocephalic and subclavian arteries are widely patent. The carotid arteries are patent without evidence of dissection or stenosis. The vertebral arteries are patent and codominant with antegrade flow bilaterally  and no evidence of dissection or significant stenosis. IMPRESSION: 1. No acute intracranial abnormality. 2. Mild chronic small vessel ischemic disease. 3. Small chronic cerebellar infarcts, new from 2017. 4. Patent circle of Willis without significant proximal stenosis. 5. Negative neck MRA. Electronically Signed   By: Logan Bores M.D.   On: 12/29/2017 13:10   US Liver Doppler  Result Date: 12/28/2017 CLINICAL DATA:  Cirrhosis EXAM: DUPLEX ULTRASOUND OF LIVER TECHNIQUE: Color and duplex Doppler ultrasound was performed to evaluate the hepatic in-flow and out-flow vessels. COMPARISON:  CT 08/06/2016 and previous FINDINGS: Portal Vein 0.8 cm diameter. No evidence of occlusion or thrombus. Velocities (all hepatopetal): Main:  21-30 cm/sec Right:  15 cm/sec Left:  19 cm/sec Hepatic Vein Velocities (all hepatofugal): Right:  28 cm/sec Middle:  15 cm/sec Left:  40 cm/sec Intrahepatic IVC patent, 62 cm/second Hepatic Artery Velocity:  83 cm/sec Spleen 10.8 x 5.1 x 11.2 cm. (Volume = 320 cm^3). Splenic Vein Velocity: 27 cm/sec. No evidence of occlusion or thrombus. Varices: None identified Ascites: None IMPRESSION: 1. Normal hepatic vascular Doppler evaluation. Electronically Signed   By: Lucrezia Europe M.D.   On: 12/28/2017 09:33   Medications: I have reviewed the patient's current medications. Scheduled Meds: . apixaban  5 mg Oral BID  . atorvastatin  40 mg Oral QHS  . feeding supplement (GLUCERNA SHAKE)  237 mL Oral TID BM  . [START ON 12/30/2017] fluconazole  200 mg Oral Daily  . fluconazole  400 mg Oral Once  . furosemide  40 mg Oral Daily  . insulin aspart  0-5 Units Subcutaneous QHS  . insulin aspart  0-9 Units Subcutaneous TID WC  . insulin aspart  3 Units Subcutaneous TID WC  . insulin detemir  12 Units Subcutaneous QHS  . lactulose  20 g Oral BID  . levothyroxine  75 mcg Oral QAC breakfast  . pantoprazole (PROTONIX) IV  40 mg Intravenous Q12H  . polyethylene glycol  17 g Oral BID  . rifaximin   550 mg Oral BID  . spironolactone  100 mg Oral Daily   Continuous Infusions: . ciprofloxacin Stopped (12/29/17 1100)  . octreotide  (SANDOSTATIN)    IV infusion 50 mcg/hr (12/29/17 1553)  . vancomycin 1,000 mg (12/29/17 1707)   PRN Meds:.acetaminophen **OR** acetaminophen, bisacodyl, cyclobenzaprine, oxyCODONE-acetaminophen, senna-docusate   Assessment: Active Problems:   Melena   GI bleed uncontrolled diabetes on insulin, hemoglobin A1c 15 Decompensated cirrhosis of liver, hepatic encephalopathy admitted with melena, generalized weakness and encephalopathy. EGD on 12/28/2017 revealed peptic ulcer disease, esophageal candidiasis and severe erosive esophagitis, iatrogenic esophageal tear while doing brushing, status post clip with no evidence of mediastinitis or pneumomediastinum  Plan: Decompensated liver disease: secondary to NASH, child class C Continue octreotide and Protonix drips for next 48 hours as patient is started on anticoagulation with history of stroke and worsening left-sided weakness She has peptic ulcer disease and severe portal hypertensive gastropathy Switch to Protonix 40 mg twice a day long-term Volume overload: Start Lasix 40 mg daily and spironolactone 100 mg daily, 2 g sodium  diet Monitor electrolytes, urine output and serum creatinine daily Anemia/thrombocytopenia/and coagulopathy: mild and stable Hepatic encephalopathy: continue lactulose, rifaximin long-term HCC screening: AFP normal, ordered ultrasound liver. Ultrasound liver Dopplers are unremarkable  Esophageal candidiasis: Fluconazole 400 mg today, 200 mg daily from tomorrow for 2 weeks total  Esophageal tear: s/p clip Continue empiric antibiotics, vancomycin and Cipro for another 24 hours then discontinue Advance diet tomorrow  Dr. Vicente Males to cover from tomorrow   LOS: 1 day   Holleigh Crihfield 12/29/2017, 7:18 PM

## 2017-12-29 NOTE — Progress Notes (Signed)
Aide called this nurse to room as pt had gotten weak when getting up to bathroom.  Pt was lowered to floor without incident and then lifted to be with help of several nurses. Pt was talking the entire time and alert/oriented. At start of shift pt was more alert than last night when I assessed her.  She was able to use both arms, talk on the phone, eat ice from a cup with a spoon.  She was able to describe to family on the phone what the doctor had told her was her diagnosis today (thrush in the esophagus). After getting back into bed, pt was leaning to the right, unable move her left arm or leg, her left pupil was sluggish, she reported the wrong last name Lenny Pastel) which is her maiden name. NIH of 16. Pt was alert and talking when taken to CT and upon returning to room, her NIH came down to 9.  She had more movement of her left arm.  Swallow eval was not completed because pt is npo with sips of water following EGD today. Pt is currently relaxed and comfortable. Telemetry monitor is tachycardic at HR 97 but regular. Dorna Bloom RN

## 2017-12-29 NOTE — Consult Note (Signed)
Electrolytes  K 3.8, mag 1.3, SCr 1.03, CrCl 51.7 ml/min  Goal >4, Mg >2.  Will give KCl 20 mEq PO x1 Mag sulfate 4 g IV x1 Recheck in AM  Also recommended checking an EKG tomorrow.  Rayna Sexton, PharmD, BCPS Clinical Pharmacist 12/29/2017 3:19 PM

## 2017-12-29 NOTE — Progress Notes (Signed)
Patient off unit for testing. Will resume care upon return. [name] [facility]

## 2017-12-29 NOTE — Consult Note (Signed)
Referring Physician: Pyreddy    Chief Complaint: Left sided weakness  HPI: [name] is an 62 y.o. female with a history of stroke and afib off anticoagulation due to GIB who on last evening while going to the bathroom had acute onset of left sided weakness.  Initial NIHSS of 16.  Symptoms improved today. Patient also reports decreased vision in both eyes that occurred 4-5 days ago.    Date last known well: Date: 12/28/2017 Time last known well: Time: 22:50 tPA Given: No: GIB  Past Medical History:  Diagnosis Date  . Allergy   . Anxiety   . Cancer (HCC)    HX OF CANCER OF UTERUS   . Cirrhosis of liver not due to alcohol ([place]) 11-16-14  . Degenerative disk disease   . Diverticulitis   . Gastroparesis   . GERD (gastroesophageal reflux disease)   . History of hiatal hernia   . Hypertension   . Hypothyroid   . Intussusception intestine ([place]) 05/2015  . PAF (paroxysmal atrial fibrillation) ([place]) 03/2015   a. new onset 03/2015 in setting of intractable N/V; b. on Eliquis 5 mg bid; c. CHADSVASc 4 (DM, TIA x 2, female)  . Pancreatitis   . Sick sinus syndrome ([place])   . Stomach ulcer   . Stroke [facility])    with minimal left sided weakness  . Syncope 01/2015  . TIA (transient ischemic attack) 02/2015  . Type 1 diabetes ([place])    on levemir    Past Surgical History:  Procedure Laterality Date  . ABDOMINAL HYSTERECTOMY    . CARDIAC CATHETERIZATION N/A 01/12/2016   Procedure: Left Heart Cath and Coronary Angiography;  Surgeon: Wellington Hampshire, MD;  Location: [place];  Service: Cardiovascular;  Laterality: N/A;  . CHOLECYSTECTOMY    . CYSTOSCOPY/URETEROSCOPY/HOLMIUM LASER Right 07/14/2016   Procedure: CYSTOSCOPY/URETEROSCOPY/HOLMIUM LASER;  Surgeon: Alexis Frock, MD;  Location: [facility] ORS;  Service: Urology;  Laterality: Right;  . ESOPHAGOGASTRODUODENOSCOPY N/A 04/04/2015   Procedure: ESOPHAGOGASTRODUODENOSCOPY (EGD);  Surgeon: Hulen Luster, MD;  [facility]: [facility] ENDOSCOPY;  Service:  Endoscopy;  Laterality: N/A;  . ESOPHAGOGASTRODUODENOSCOPY (EGD) WITH PROPOFOL N/A 01/18/2016   Procedure: ESOPHAGOGASTRODUODENOSCOPY (EGD) WITH PROPOFOL;  Surgeon: Lucilla Lame, MD;  Location: [facility] ENDOSCOPY;  Service: Endoscopy;  Laterality: N/A;  . FLEXIBLE SIGMOIDOSCOPY N/A 01/18/2016   Procedure: FLEXIBLE SIGMOIDOSCOPY;  Surgeon: Lucilla Lame, MD;  Location: [facility] ENDOSCOPY;  Service: Endoscopy;  Laterality: N/A;  . HERNIA REPAIR      Family History  Problem Relation Age of Onset  . Hypertension Mother   . CAD Sister   . Heart attack Sister        Deceased November 16, 2014  . CAD Brother    Social History:  reports that she has quit smoking. Her smoking use included cigarettes. She has never used smokeless tobacco. She reports that she does not drink alcohol or use drugs.  Allergies:  Allergies  Allergen Reactions  . Hydrocodone Other (See Comments)    Pt states that this medication caused cirrhosis of the liver.    . Aspirin   . Erythromycin Other (See Comments)    Reaction:  Fever   . Prednisone Other (See Comments)    Reaction:  Unknown   . Rosiglitazone Maleate Swelling  . Codeine Sulfate Rash  . Tetanus-Diphtheria Toxoids Td Rash and Other (See Comments)    Reaction:  Fever     Medications:  I have reviewed the patient's current medications. Prior to Admission:  Medications Prior to  Admission  Medication Sig Dispense Refill Last Dose  . albuterol (PROVENTIL HFA;VENTOLIN HFA) 108 (90 Base) MCG/ACT inhaler Inhale 2 puffs into the lungs every 6 (six) hours as needed for wheezing or shortness of breath. 1 Inhaler 0 PRN at PRN  . apixaban (ELIQUIS) 5 MG TABS tablet Take 1 tablet (5 mg total) by mouth 2 (two) times daily. 60 tablet 0 12/26/2017 at am  . atorvastatin (LIPITOR) 40 MG tablet Take 1 tablet (40 mg total) by mouth at bedtime. 30 tablet 0 12/25/2017 at pm  . budesonide-formoterol (SYMBICORT) 160-4.5 MCG/ACT inhaler Inhale 2 puffs into the lungs 2 (two) times daily. 1 Inhaler 0  12/26/2017 at am  . cyclobenzaprine (FLEXERIL) 10 MG tablet Take 10 mg by mouth 3 (three) times daily as needed for muscle spasms.    PRN at PRN  . insulin aspart (NOVOLOG FLEXPEN) 100 UNIT/ML FlexPen Inject 5 units into the skin with meals for blood sugars above 130. (Patient taking differently: Inject 5 Units into the skin 3 (three) times daily with meals as needed for high blood sugar (for blood sugar >130). ) 3 mL 1 PRN at PRN  . Insulin Detemir (LEVEMIR FLEXPEN) 100 UNIT/ML Pen Inject 12 Units into the skin every evening. 3 mL 1 12/25/2017 at pm  . levothyroxine (SYNTHROID, LEVOTHROID) 75 MCG tablet Take 1 tablet (75 mcg total) by mouth daily before breakfast. 30 tablet 0 Past Week at am  . lisinopril (PRINIVIL,ZESTRIL) 5 MG tablet Take 1 tablet by mouth every morning for kidney protection against diabetes. 30 tablet 0 12/26/2017 at am  . metoprolol tartrate (LOPRESSOR) 25 MG tablet Take 1 tablet (25 mg total) by mouth 2 (two) times daily. 60 tablet 0 12/26/2017 at am  . oxyCODONE ER (XTAMPZA ER) 9 MG C12A Take 9 mg by mouth every 8 (eight) hours.   Past Month at Unknown time   Scheduled: . atorvastatin  40 mg Oral QHS  . insulin aspart  0-5 Units Subcutaneous QHS  . insulin aspart  0-9 Units Subcutaneous TID WC  . insulin aspart  3 Units Subcutaneous TID WC  . insulin detemir  12 Units Subcutaneous QHS  . lactulose  20 g Oral BID  . levothyroxine  75 mcg Oral QAC breakfast  . rifaximin  550 mg Oral BID    ROS: History obtained from the patient  General ROS: negative for - chills, fatigue, fever, night sweats, weight gain or weight loss Psychological ROS: negative for - behavioral disorder, hallucinations, memory difficulties, mood swings or suicidal ideation Ophthalmic ROS: decreased vision ENT ROS: negative for - epistaxis, nasal discharge, oral lesions, sore throat, tinnitus or vertigo Allergy and Immunology ROS: negative for - hives or itchy/watery eyes Hematological and Lymphatic  ROS: negative for - bleeding problems, bruising or swollen lymph nodes Endocrine ROS: negative for - galactorrhea, hair pattern changes, polydipsia/polyuria or temperature intolerance Respiratory ROS: negative for - cough, hemoptysis, shortness of breath or wheezing Cardiovascular ROS: negative for - chest pain, dyspnea on exertion, edema or irregular heartbeat Gastrointestinal ROS: melena Genito-Urinary ROS: negative for - dysuria, hematuria, incontinence or urinary frequency/urgency Musculoskeletal ROS: neck and back pain Neurological ROS: as noted in HPI Dermatological ROS: negative for rash and skin lesion changes  Physical Examination: Blood pressure (!) 128/92, pulse 99, temperature 98.5 F (36.9 C), temperature source Oral, resp. rate 14, height 5' 3"  (1.6 m), weight 69.9 kg (154 lb 1.6 oz), SpO2 98 %.  HEENT-  Normocephalic, no lesions, without obvious abnormality.  Normal  external eye and conjunctiva.  Normal TM's bilaterally.  Normal auditory canals and external ears. Normal external nose, mucus membranes and septum.  Normal pharynx. Cardiovascular- S1, S2 normal, pulses palpable throughout   Lungs- chest clear, no wheezing, rales, normal symmetric air entry Abdomen- soft, non-tender; bowel sounds normal; no masses,  no organomegaly Extremities- no edema Lymph-no adenopathy palpable Musculoskeletal-no joint tenderness, deformity or swelling Skin-warm and dry, no hyperpigmentation, vitiligo, or suspicious lesions  Neurological Examination   Mental Status: Alert, oriented, thought content appropriate.  Speech fluent without evidence of aphasia.  Able to follow 3 step commands without difficulty. Cranial Nerves: II: Discs flat bilaterally; Decreased peripheral vision bilaterally, pupils equal, round, reactive to light and accommodation III,IV, VI: ptosis not present, extra-ocular motions intact bilaterally V,VII: smile symmetric, facial light touch sensation normal  bilaterally VIII: hearing normal bilaterally IX,X: gag reflex present XI: bilateral shoulder shrug XII: midline tongue extension Motor: Right : Upper extremity   5/5    Left:     Upper extremity   5/5  Lower extremity   5/5     Lower extremity   5/5 There are some limitations due to pain Sensory: Pinprick and light touch intact throughout, bilaterally Deep Tendon Reflexes: 2+ in the upper extremities and absent in the lower extremities Plantars: Right: upgoing   Left: upgoing Cerebellar: Questionable finger-to-nose dysmetria using the LUE.  Unable to perform heel-to-shin testing due to pain Gait: not tested due to safety concerns    Laboratory Studies:  Basic Metabolic Panel: Recent Labs  Lab 12/27/17 0616 12/28/17 0427 12/29/17 0212  NA 131* 136 134*  K 3.8 3.8 3.8  CL 101 108 110  CO2 22 24 20*  GLUCOSE 456* 94 200*  BUN 14 12 12   CREATININE 1.37* 1.02* 1.03*  CALCIUM 7.9* 7.6* 6.9*  MG  --   --  1.3*    Liver Function Tests: Recent Labs  Lab 12/27/17 1836  AST 21  ALT 18  ALKPHOS 128*  BILITOT 0.5  PROT 5.7*  ALBUMIN 2.0*   No results for input(s): LIPASE, AMYLASE in the last 168 hours. No results for input(s): AMMONIA in the last 168 hours.  CBC: Recent Labs  Lab 12/27/17 0616 12/28/17 0531 12/29/17 0212  WBC 5.8 6.8 7.8  HGB 8.7* 10.4* 8.5*  HCT 27.4* 31.9* 26.5*  MCV 80.4 80.7 78.1*  PLT 131* 131* 138*    Cardiac Enzymes: No results for input(s): CKTOTAL, CKMB, CKMBINDEX, TROPONINI in the last 168 hours.  BNP: Invalid input(s): POCBNP  CBG: Recent Labs  Lab 12/27/17 2102 12/28/17 0814 12/28/17 1733 12/28/17 2041 12/29/17 0736  GLUCAP 149* 79 121* 131* 142*    Microbiology: Results for orders placed or performed during the hospital encounter of 12/27/17  KOH prep     Status: None   Collection Time: 12/28/17 12:30 PM  Result Value Ref Range Status   Specimen Description ESOPHAGUS  Final   Special Requests NONE  Final   KOH  Prep   Final    YEAST WITH PSEUDOHYPHAE Performed at [facility], [address] [place], [place] [address]    Report Status 12/28/2017 FINAL  Final  Culture, blood (Routine X 2) w Reflex to ID Panel     Status: None (Preliminary result)   Collection Time: 12/29/17  2:12 AM  Result Value Ref Range Status   Specimen Description BLOOD BLOOD RIGHT HAND  Final   Special Requests   Final    BOTTLES DRAWN AEROBIC AND  ANAEROBIC Blood Culture adequate volume   Culture   Final    NO GROWTH < 12 HOURS Performed at [facility], [address] [place], [place] [address]    Report Status PENDING  Incomplete  Culture, blood (Routine X 2) w Reflex to ID Panel     Status: None (Preliminary result)   Collection Time: 12/29/17  2:23 AM  Result Value Ref Range Status   Specimen Description BLOOD BLOOD LEFT HAND  Final   Special Requests   Final    BOTTLES DRAWN AEROBIC AND ANAEROBIC Blood Culture results may not be optimal due to an inadequate volume of blood received in culture bottles   Culture   Final    NO GROWTH < 12 HOURS Performed at [facility], [address] [place], [place] [address]    Report Status PENDING  Incomplete    Coagulation Studies: Recent Labs    12/27/17 2118/11/10  LABPROT 16.8*  INR 1.37    Urinalysis:  Recent Labs  Lab 12/27/17 1556 12/29/17 0330  COLORURINE YELLOW* AMBER*  LABSPEC 1.024 1.015  PHURINE 5.0 5.0  GLUCOSEU >=500* 150*  HGBUR LARGE* LARGE*  BILIRUBINUR NEGATIVE NEGATIVE  KETONESUR 5* 5*  PROTEINUR 30* 30*  NITRITE NEGATIVE NEGATIVE  LEUKOCYTESUR LARGE* LARGE*    Lipid Panel:    Component Value Date/Time   CHOL 66 12/29/2017 0212   CHOL 182 11/12/2011 0325   TRIG 107 12/29/2017 0212   TRIG 149 11/12/2011 0325   HDL 13 (L) 12/29/2017 0212   HDL 23 (L) 11/12/2011 0325   CHOLHDL 5.1 12/29/2017 0212   VLDL 21 12/29/2017 0212   VLDL 30 11/12/2011 0325   LDLCALC 32 12/29/2017 0212   LDLCALC 129 (H)  11/12/2011 0325    HgbA1C:  Lab Results  Component Value Date   HGBA1C 14.1 (H) 12/29/2017    Urine Drug Screen:      Component Value Date/Time   LABOPIA POSITIVE (A) 01/10/2016 1958   LABOPIA POSITIVE (A) 11/02/2007 0055   COCAINSCRNUR NONE DETECTED 01/10/2016 1958   LABBENZ NONE DETECTED 01/10/2016 1958   LABBENZ NONE DETECTED 11/02/2007 0055   AMPHETMU NONE DETECTED 01/10/2016 1958   AMPHETMU NONE DETECTED 11/02/2007 0055   THCU NONE DETECTED 01/10/2016 1958   THCU NONE DETECTED 11/02/2007 0055   LABBARB NONE DETECTED 01/10/2016 1958   LABBARB  11/02/2007 0055    NONE DETECTED        DRUG SCREEN FOR MEDICAL PURPOSES ONLY.  IF CONFIRMATION IS NEEDED FOR ANY PURPOSE, NOTIFY LAB WITHIN 5 DAYS.    Alcohol Level: No results for input(s): ETH in the last 168 hours.  Other results: EKG: sinus tachycardia at 119 bpm.  Imaging: Ct Head Wo Contrast  Result Date: 12/29/2017 CLINICAL DATA:  Sudden onset left-sided weakness. EXAM: CT HEAD WITHOUT CONTRAST TECHNIQUE: Contiguous axial images were obtained from the base of the skull through the vertex without intravenous contrast. COMPARISON:  12/15/2017 head CT FINDINGS: Brain: No mass lesion, intraparenchymal hemorrhage or extra-axial collection. No evidence of acute cortical infarct. Normal appearance of the brain parenchyma and extra axial spaces for age. Vascular: No hyperdense vessel or unexpected vascular calcification. Skull: Normal visualized skull base, calvarium and extracranial soft tissues. Sinuses/Orbits: No sinus fluid levels or advanced mucosal thickening. No mastoid effusion. Normal orbits. IMPRESSION: Normal aging brain. Electronically Signed   By: Ulyses Jarred M.D.   On: 12/29/2017 00:31   Dg Chest Port 1 View  Result Date: 12/28/2017 CLINICAL DATA:  Central chest pain EXAM: PORTABLE CHEST 1 VIEW COMPARISON:  12/28/2017 FINDINGS: Low lung volumes. Left basilar atelectasis or infiltrate slightly improved. Improving  airspace opacity in the right lung base. No visible effusions. Heart is normal size. IMPRESSION: Improving bibasilar opacities. Residual left basilar atelectasis or infiltrate. Electronically Signed   By: Rolm Baptise M.D.   On: 12/28/2017 23:44   Dg Chest Port 1 View  Result Date: 12/28/2017 CLINICAL DATA:  Encounter for esophogeal tear. S/P endoscopy. Patient had difficulty following instructions. Patient would not take deep inspiration for imaging study. Best images obtainable. EXAM: PORTABLE CHEST - 1 VIEW COMPARISON:  12/14/2017 FINDINGS: Relatively low lung volumes. Some increase in patchy subsegmental atelectasis or consolidation in both lung bases. Heart size upper limits normal. Aortic Atherosclerosis (ICD10-170.0) No definite effusion.  No pneumothorax or pneumomediastinum evident. Visualized bones unremarkable. Surgical clip projects over the region of the distal esophagus. IMPRESSION: 1. Low lung volumes with some increase in bibasilar atelectasis/consolidation. Electronically Signed   By: Lucrezia Europe M.D.   On: 12/28/2017 13:00   US Liver Doppler  Result Date: 12/28/2017 CLINICAL DATA:  Cirrhosis EXAM: DUPLEX ULTRASOUND OF LIVER TECHNIQUE: Color and duplex Doppler ultrasound was performed to evaluate the hepatic in-flow and out-flow vessels. COMPARISON:  CT 08/06/2016 and previous FINDINGS: Portal Vein 0.8 cm diameter. No evidence of occlusion or thrombus. Velocities (all hepatopetal): Main:  21-30 cm/sec Right:  15 cm/sec Left:  19 cm/sec Hepatic Vein Velocities (all hepatofugal): Right:  28 cm/sec Middle:  15 cm/sec Left:  40 cm/sec Intrahepatic IVC patent, 62 cm/second Hepatic Artery Velocity:  83 cm/sec Spleen 10.8 x 5.1 x 11.2 cm. (Volume = 320 cm^3). Splenic Vein Velocity: 27 cm/sec. No evidence of occlusion or thrombus. Varices: None identified Ascites: None IMPRESSION: 1. Normal hepatic vascular Doppler evaluation. Electronically Signed   By: Lucrezia Europe M.D.   On: 12/28/2017 09:33     Assessment: 62 y.o. female admitted with GIB, history of Afib on no anticoagulation secondary to bleed who on last evening had acute onset of left sided weakness.  Patient with history of stroke in the past that affected the left side.  Patient improved today.  MRI of the brain reviewed and shows no acute changes.  No evidence of LVO.  Patient continues to not be an anticoagulation candidate.  Carotid dopplers show no evidence of hemodynamically significant stenosis.  Echocardiogram shows no cardiac source of emboli with an EF of 55-60%.  A1c 14.1, LDL 32. TIA on the differential as well as hypoperfusion (BP has been low at times).  Stroke Risk Factors - atrial fibrillation, diabetes mellitus and hypertension  Plan: 1. Orthostatic vitals 2. Continued telemetry 3. Blood sugar management with target A1c<7.0 4. Liberalized BP 5. Restart anticoagulation once medically appropriate.   6. Patient with new visual complaints.  No explanation on imaging. Ophthalmology to evaluate.      Alexis Goodell, MD Neurology [phone] 12/29/2017, 11:21 AM

## 2017-12-29 NOTE — Progress Notes (Signed)
Discussed with GI . Advised to start full liquid diet Will resume eliquis for anticoagulation for afib MRI brain no new cva

## 2017-12-29 NOTE — Progress Notes (Addendum)
[facility] NAME: [name]    MR#:  [id number]  DATE OF BIRTH:  12-06-1955  SUBJECTIVE:  Patient seen and evaluated today this morning Last night patient developed new weakness in the left lower extremity and left upper extremity Also had a fall last night CT head was done which showed no acute abnormality Patient has previous history of stroke with left-sided weakness No new episodes of vomiting of blood or rectal bleed  REVIEW OF SYSTEMS:    ROS  CONSTITUTIONAL: No documented fever. No fatigue, weakness. No weight gain, no weight loss.  EYES: No blurry or double vision.  ENT: No tinnitus. No postnasal drip. No redness of the oropharynx.  RESPIRATORY: No cough, no wheeze, no hemoptysis. No dyspnea.  CARDIOVASCULAR: No chest pain. No orthopnea. No palpitations. No syncope.  GASTROINTESTINAL: No nausea, no vomiting or diarrhea. No abdominal pain. No melena or hematochezia.  GENITOURINARY: No dysuria or hematuria.  ENDOCRINE: No polyuria or nocturia. No heat or cold intolerance.  HEMATOLOGY: No anemia. No bruising. No bleeding.  INTEGUMENTARY: No rashes. No lesions.  MUSCULOSKELETAL: No arthritis. No swelling. No gout.  NEUROLOGIC: No numbness, tingling, or ataxia. No seizure-type activity.  Has a left lower extremity and upper extremity weakness PSYCHIATRIC: No anxiety. No insomnia. No ADD.   DRUG ALLERGIES:   Allergies  Allergen Reactions  . Hydrocodone Other (See Comments)    Pt states that this medication caused cirrhosis of the liver.    . Aspirin   . Erythromycin Other (See Comments)    Reaction:  Fever   . Prednisone Other (See Comments)    Reaction:  Unknown   . Rosiglitazone Maleate Swelling  . Codeine Sulfate Rash  . Tetanus-Diphtheria Toxoids Td Rash and Other (See Comments)    Reaction:  Fever     VITALS:  Blood pressure 118/73, pulse 91, temperature 98.1 F (36.7 C), temperature source Oral, resp.  rate 18, height 5' 3"  (1.6 m), weight 69.9 kg (154 lb 1.6 oz), SpO2 96 %.  PHYSICAL EXAMINATION:   Physical Exam  GENERAL:  62 y.o.-year-old patient lying in the bed with no acute distress.  EYES: Pupils equal, round, reactive to light and accommodation. No scleral icterus. Extraocular muscles intact.  HEENT: Head atraumatic, normocephalic. Oropharynx and nasopharynx clear.  NECK:  Supple, no jugular venous distention. No thyroid enlargement, no tenderness.  LUNGS: Normal breath sounds bilaterally, no wheezing, rales, rhonchi. No use of accessory muscles of respiration.  CARDIOVASCULAR: S1, S2 normal. No murmurs, rubs, or gallops.  ABDOMEN: Soft, nontender, nondistended. Bowel sounds present. No organomegaly or mass.  EXTREMITIES: No cyanosis, clubbing or edema b/l.    NEUROLOGIC: Cranial nerves II through XII are intact.  Muscle strength 3 / 5 left upper extremity Muscle strength 2 / 5 left lower extremity Muscle strength 5/5 right upper and lower extremity No sensory deficits PSYCHIATRIC: The patient is alert and oriented x 3.  SKIN: No obvious rash, lesion, or ulcer.   LABORATORY PANEL:   CBC Recent Labs  Lab 12/29/17 0212  WBC 7.8  HGB 8.5*  HCT 26.5*  PLT 138*   ------------------------------------------------------------------------------------------------------------------ Chemistries  Recent Labs  Lab 12/27/17 1836  12/29/17 0212  NA  --    < > 134*  K  --    < > 3.8  CL  --    < > 110  CO2  --    < > 20*  GLUCOSE  --    < >  200*  BUN  --    < > 12  CREATININE  --    < > 1.03*  CALCIUM  --    < > 6.9*  MG  --   --  1.3*  AST 21  --   --   ALT 18  --   --   ALKPHOS 128*  --   --   BILITOT 0.5  --   --    < > = values in this interval not displayed.   ------------------------------------------------------------------------------------------------------------------  Cardiac Enzymes No results for input(s): TROPONINI in the last 168  hours. ------------------------------------------------------------------------------------------------------------------  RADIOLOGY:  Ct Head Wo Contrast  Result Date: 12/29/2017 CLINICAL DATA:  Sudden onset left-sided weakness. EXAM: CT HEAD WITHOUT CONTRAST TECHNIQUE: Contiguous axial images were obtained from the base of the skull through the vertex without intravenous contrast. COMPARISON:  12/15/2017 head CT FINDINGS: Brain: No mass lesion, intraparenchymal hemorrhage or extra-axial collection. No evidence of acute cortical infarct. Normal appearance of the brain parenchyma and extra axial spaces for age. Vascular: No hyperdense vessel or unexpected vascular calcification. Skull: Normal visualized skull base, calvarium and extracranial soft tissues. Sinuses/Orbits: No sinus fluid levels or advanced mucosal thickening. No mastoid effusion. Normal orbits. IMPRESSION: Normal aging brain. Electronically Signed   By: Ulyses Jarred M.D.   On: 12/29/2017 00:31   Dg Chest Port 1 View  Result Date: 12/28/2017 CLINICAL DATA:  Central chest pain EXAM: PORTABLE CHEST 1 VIEW COMPARISON:  12/28/2017 FINDINGS: Low lung volumes. Left basilar atelectasis or infiltrate slightly improved. Improving airspace opacity in the right lung base. No visible effusions. Heart is normal size. IMPRESSION: Improving bibasilar opacities. Residual left basilar atelectasis or infiltrate. Electronically Signed   By: Rolm Baptise M.D.   On: 12/28/2017 23:44   Dg Chest Port 1 View  Result Date: 12/28/2017 CLINICAL DATA:  Encounter for esophogeal tear. S/P endoscopy. Patient had difficulty following instructions. Patient would not take deep inspiration for imaging study. Best images obtainable. EXAM: PORTABLE CHEST - 1 VIEW COMPARISON:  12/14/2017 FINDINGS: Relatively low lung volumes. Some increase in patchy subsegmental atelectasis or consolidation in both lung bases. Heart size upper limits normal. Aortic Atherosclerosis  (ICD10-170.0) No definite effusion.  No pneumothorax or pneumomediastinum evident. Visualized bones unremarkable. Surgical clip projects over the region of the distal esophagus. IMPRESSION: 1. Low lung volumes with some increase in bibasilar atelectasis/consolidation. Electronically Signed   By: Lucrezia Europe M.D.   On: 12/28/2017 13:00   US Liver Doppler  Result Date: 12/28/2017 CLINICAL DATA:  Cirrhosis EXAM: DUPLEX ULTRASOUND OF LIVER TECHNIQUE: Color and duplex Doppler ultrasound was performed to evaluate the hepatic in-flow and out-flow vessels. COMPARISON:  CT 08/06/2016 and previous FINDINGS: Portal Vein 0.8 cm diameter. No evidence of occlusion or thrombus. Velocities (all hepatopetal): Main:  21-30 cm/sec Right:  15 cm/sec Left:  19 cm/sec Hepatic Vein Velocities (all hepatofugal): Right:  28 cm/sec Middle:  15 cm/sec Left:  40 cm/sec Intrahepatic IVC patent, 62 cm/second Hepatic Artery Velocity:  83 cm/sec Spleen 10.8 x 5.1 x 11.2 cm. (Volume = 320 cm^3). Splenic Vein Velocity: 27 cm/sec. No evidence of occlusion or thrombus. Varices: None identified Ascites: None IMPRESSION: 1. Normal hepatic vascular Doppler evaluation. Electronically Signed   By: Lucrezia Europe M.D.   On: 12/28/2017 09:33   Echocardiogram  Left ventricle: The cavity size was normal. Systolic function was   normal. The estimated ejection fraction was in the range of 55%   to 60%. Regional wall motion  abnormalities cannot be excluded.   The study is not technically sufficient to allow evaluation of LV   diastolic function. - Mitral valve: There was mild to moderate regurgitation. - Left atrium: The atrium was mildly dilated. - Right ventricle: Systolic function was normal. - Pulmonary arteries: Systolic pressure was within the normal   range. ASSESSMENT AND PLAN:   62 year old female patient with history of A. fib on Eliquis, cirrhosis of liver, GERD, hiatal hernia, hypertension, hypothyroidism, CVA, cancer of uterus, peptic  ulcer was admitted to hospitalist service for melena.  1.  Severe erosive gastritis with ulcers in the antrum and base On IV Protonix drip Strict n.p.o. as per gastroenterology Diet will be started once GI clears the patient Gastroenterology follow-up  2.  Tear in the esophagus status post endoscopy has been clipped during the procedure Prophylactically patient started on IV vancomycin and ciprofloxacin antibiotics If patient develops any chest pain , will get Gastrografin swallow study  3.  Paroxysmal atrial fibrillation Eliquis on hold secondary to GI bleed  4.  Cirrhosis of liver Follow-up liver function tests  5.  Anemia Monitor hemoglobin hematocrit Drop in hemoglobin could be from hemodilution  Monitor closely  6.  New Left hemiparesis Neurology consultation MRI brain Check carotid ultrasound     All the records are reviewed and case discussed with Care Management/Social Worker. Management plans discussed with the patient, family and they are in agreement.  CODE STATUS: Full code  DVT Prophylaxis: SCDs  TOTAL TIME TAKING CARE OF THIS PATIENT: 35 minutes.   POSSIBLE D/C IN 2 to 3 DAYS, DEPENDING ON CLINICAL CONDITION.  Saundra Shelling M.D on 12/29/2017 at 10:51 AM  Between 7am to 6pm - Pager - [phone]  After 6pm go to [email or web address] - password [email or web address] [facility]  Office  [phone]  CC: Primary care physician; Patient, No Pcp Per  Note: This dictation was prepared with Dragon dictation along with smaller phrase technology. Any transcriptional errors that result from this process are unintentional.

## 2017-12-29 NOTE — Evaluation (Signed)
Physical Therapy Evaluation Patient Details Name: [name] MRN: [id number] DOB: 09-25-1955 Today's Date: 12/29/2017   History of Present Illness  62 y/o female , noted was admittedwith L side weakness and atelectasis and developed an esophageal tear.  PMHx:  UTI, sepsis, hyperglycemia, DM, NASH, cirrhosis, hepatic encephalopathy, GI bleed, PUD,   Clinical Impression  Pt is walking only short trips due to her limited ability to control LLE with weakness and tremors of movement.  Her instability is not something she is aware of so will need to go to rehab to increase safety and strength as well as reduce fall risk.    Follow Up Recommendations SNF    Equipment Recommendations  None recommended by PT    Recommendations for Other Services       Precautions / Restrictions Precautions Precautions: Fall Restrictions Weight Bearing Restrictions: No      Mobility  Bed Mobility Overal bed mobility: Needs Assistance Bed Mobility: Supine to Sit     Supine to sit: Min assist     General bed mobility comments: supported trunk and then assisted hips to scoot out  Transfers Overall transfer level: Needs assistance Equipment used: Rolling walker (2 wheeled);1 person hand held assist Transfers: Sit to/from Stand           General transfer comment: prompted sequence and hand placm  Ambulation/Gait Ambulation/Gait assistance: Min assist Ambulation Distance (Feet): 5 Feet Assistive device: Rolling walker (2 wheeled);1 person hand held assist Gait Pattern/deviations: Step-through pattern;Decreased stride length;Wide base of support;Decreased weight shift to left Gait velocity: reduced Gait velocity interpretation: <1.8 ft/sec, indicate of risk for recurrent falls General Gait Details: backward unsafe shuffling  Stairs            Wheelchair Mobility    Modified Rankin (Stroke Patients Only)       Balance Overall balance assessment: Needs  assistance Sitting-balance support: Feet supported;Bilateral upper extremity supported Sitting balance-Leahy Scale: Fair     Standing balance support: Bilateral upper extremity supported Standing balance-Leahy Scale: Poor                               Pertinent Vitals/Pain Pain Assessment: 0-10 Pain Score: 0-No pain    Home Living Family/patient expects to be discharged to:: Private residence Living Arrangements: Spouse/significant other Available Help at Discharge: Family Type of Home: Mobile home Home Access: Ramped entrance     Home Layout: One level Home Equipment: Environmental consultant - 2 wheels      Prior Function Level of Independence: Independent with assistive device(s)         Comments: mainly at home, not in community     Hand Dominance   Dominant Hand: Right    Extremity/Trunk Assessment   Upper Extremity Assessment Upper Extremity Assessment: Generalized weakness    Lower Extremity Assessment Lower Extremity Assessment: Generalized weakness;LLE deficits/detail LLE Deficits / Details: L hip knee and ankle 3- to 3+ LLE Coordination: decreased gross motor;decreased fine motor    Cervical / Trunk Assessment Cervical / Trunk Assessment: Normal  Communication   Communication: No difficulties  Cognition Arousal/Alertness: Awake/alert Behavior During Therapy: WFL for tasks assessed/performed Overall Cognitive Status: Within Functional Limits for tasks assessed                                 General Comments: pt is more aware of details around her  General Comments      Exercises General Exercises - Lower Extremity Ankle Circles/Pumps: AROM;AAROM;10 reps Quad Sets: AROM;10 reps Long Arc Quad: AROM;Both;10 reps Heel Slides: AROM;Both;10 reps Hip ABduction/ADduction: AAROM;AROM;10 reps   Assessment/Plan    PT Assessment Patient needs continued PT services  PT Problem List Decreased strength;Decreased range of  motion;Decreased activity tolerance;Decreased balance;Decreased mobility;Decreased cognition;Decreased knowledge of use of DME;Decreased safety awareness       PT Treatment Interventions DME instruction;Gait training;Functional mobility training;Therapeutic exercise;Therapeutic activities;Balance training;Neuromuscular re-education;Cognitive remediation;Patient/family education    PT Goals (Current goals can be found in the Care Plan section)  Acute Rehab PT Goals Patient Stated Goal: go home PT Goal Formulation: With patient Time For Goal Achievement: 01/12/18 Potential to Achieve Goals: Good    Frequency Min 2X/week   Barriers to discharge Decreased caregiver support home alone most of the time    Co-evaluation               AM-PAC PT "6 Clicks" Daily Activity  Outcome Measure Difficulty turning over in bed (including adjusting bedclothes, sheets and blankets)?: Unable Difficulty moving from lying on back to sitting on the side of the bed? : Unable Difficulty sitting down on and standing up from a chair with arms (e.g., wheelchair, bedside commode, etc,.)?: Unable Help needed moving to and from a bed to chair (including a wheelchair)?: A Little Help needed walking in hospital room?: A Little Help needed climbing 3-5 steps with a railing? : A Lot 6 Click Score: 11    End of Session Equipment Utilized During Treatment: Gait belt Activity Tolerance: Patient tolerated treatment well;Patient limited by fatigue Patient left: with call bell/phone within reach;in chair;with chair alarm set Nurse Communication: Mobility status PT Visit Diagnosis: Unsteadiness on feet (R26.81);Muscle weakness (generalized) (M62.81);History of falling (Z91.81);Difficulty in walking, not elsewhere classified (R26.2);Hemiplegia and hemiparesis Hemiplegia - Right/Left: Left Hemiplegia - dominant/non-dominant: Non-dominant Hemiplegia - caused by: Unspecified    Time: 2010-0712 PT Time Calculation  (min) (ACUTE ONLY): 27 min   Charges:   PT Evaluation $PT Eval Moderate Complexity: 1 Mod PT Treatments $Gait Training: 8-22 mins   PT G Codes:   PT G-Codes **NOT FOR INPATIENT CLASS** Functional Assessment Tool Used: AM-PAC 6 Clicks Basic Mobility    Ramond Dial 12/29/2017, 9:40 PM   Mee Hives, PT MS Acute Rehab Dept. Number: [place] [facility] [place]

## 2017-12-30 ENCOUNTER — Inpatient Hospital Stay: Payer: Medicare HMO

## 2017-12-30 ENCOUNTER — Encounter: Payer: Self-pay | Admitting: Gastroenterology

## 2017-12-30 LAB — BASIC METABOLIC PANEL
Anion gap: 6 (ref 5–15)
BUN: 10 mg/dL (ref 6–20)
CALCIUM: 7.5 mg/dL — AB (ref 8.9–10.3)
CO2: 22 mmol/L (ref 22–32)
CREATININE: 1.2 mg/dL — AB (ref 0.44–1.00)
Chloride: 108 mmol/L (ref 101–111)
GFR, EST AFRICAN AMERICAN: 54 mL/min — AB (ref >60)
GFR, EST NON AFRICAN AMERICAN: 47 mL/min — AB (ref >60)
Glucose, Bld: 180 mg/dL — ABNORMAL HIGH (ref 65–99)
Potassium: 3.5 mmol/L (ref 3.5–5.1)
Sodium: 136 mmol/L (ref 135–145)

## 2017-12-30 LAB — HIV ANTIBODY (ROUTINE TESTING W REFLEX): HIV Screen 4th Generation wRfx: NONREACTIVE

## 2017-12-30 LAB — GLUCOSE, CAPILLARY
GLUCOSE-CAPILLARY: 211 mg/dL — AB (ref 65–99)
GLUCOSE-CAPILLARY: 58 mg/dL — AB (ref 65–99)
Glucose-Capillary: 302 mg/dL — ABNORMAL HIGH (ref 65–99)
Glucose-Capillary: 102 mg/dL — ABNORMAL HIGH (ref 65–99)
Glucose-Capillary: 58 mg/dL — ABNORMAL LOW (ref 65–99)
Glucose-Capillary: 61 mg/dL — ABNORMAL LOW (ref 65–99)
Glucose-Capillary: 78 mg/dL (ref 65–99)

## 2017-12-30 LAB — MAGNESIUM: Magnesium: 2.1 mg/dL (ref 1.7–2.4)

## 2017-12-30 IMAGING — CT CT ABD-PELV W/ CM
2 of 5 series · 16 of 46 positions shown, 18 images · IV contrast (iopamidol)
Comparison: CT scan January 22, 2016.

CLINICAL DATA: Generalized abdominal pain.

EXAM:
CT ABDOMEN AND PELVIS WITH CONTRAST
TECHNIQUE: Multidetector CT imaging of the abdomen and pelvis was performed
using the standard protocol following bolus administration of
intravenous contrast.
CONTRAST:  85mL [id number] IOPAMIDOL ([id number]) INJECTION 61%

[Series 2: routine abd pel with · axial · 0.75mm/px · z∈[-438,+8]mm · 13 of 99 slices shown, 15 images]
[im 5/99  soft-tissue]
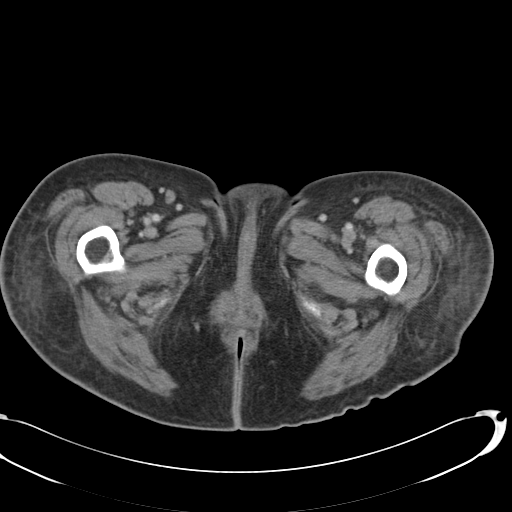
[im 5/99  bone]
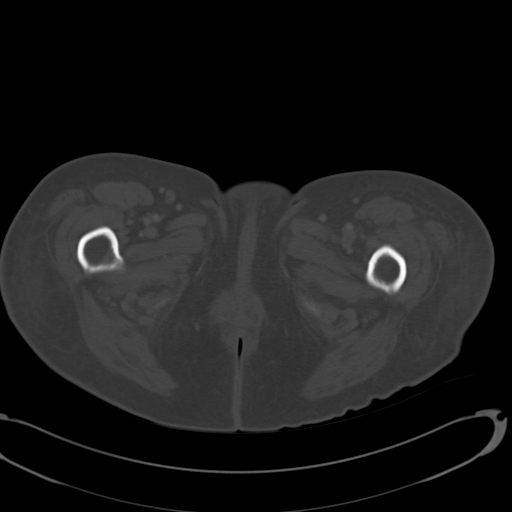
[im 15/99  soft-tissue]
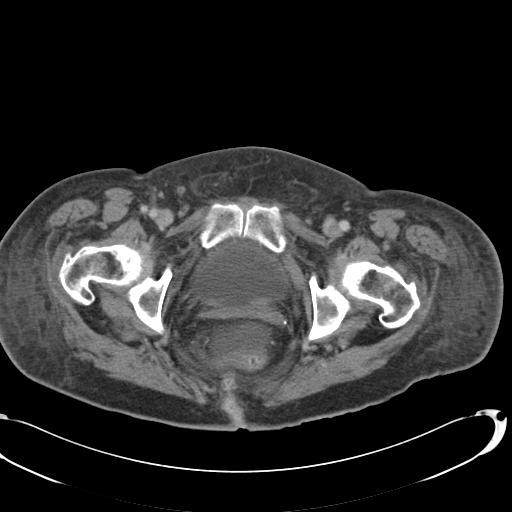
[im 20/99  soft-tissue]
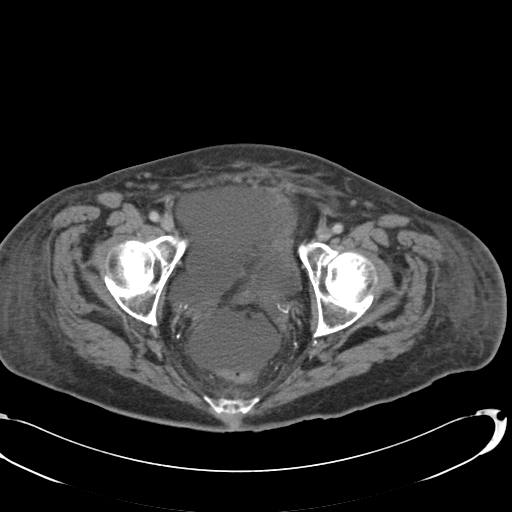
[im 30/99  soft-tissue]
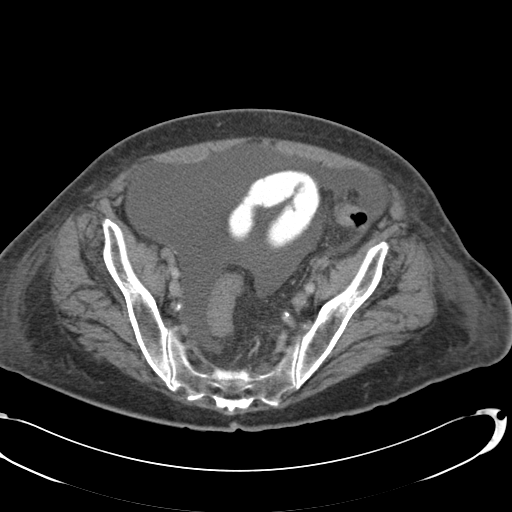
[im 35/99  soft-tissue]
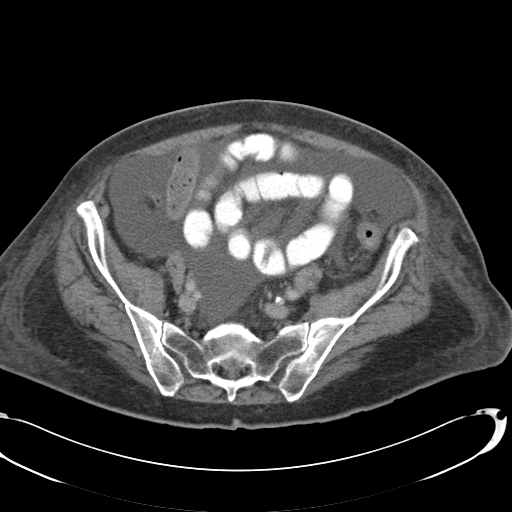
[im 45/99  soft-tissue]
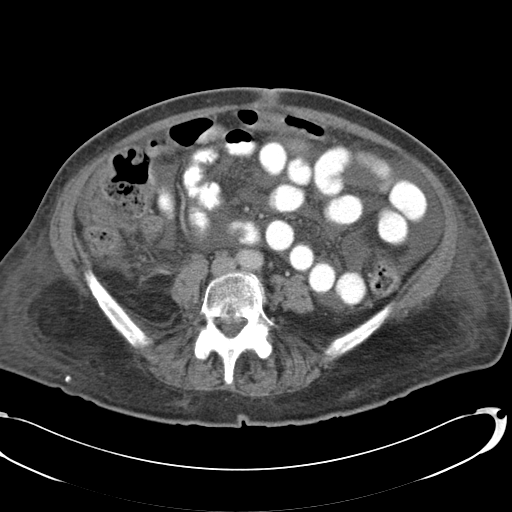
[im 50/99  soft-tissue]
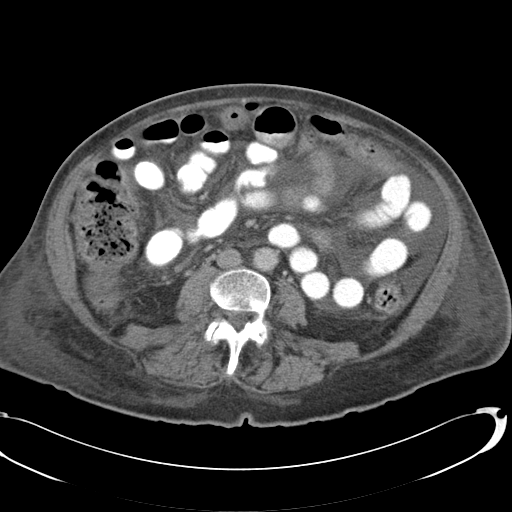
[im 54/99  soft-tissue]
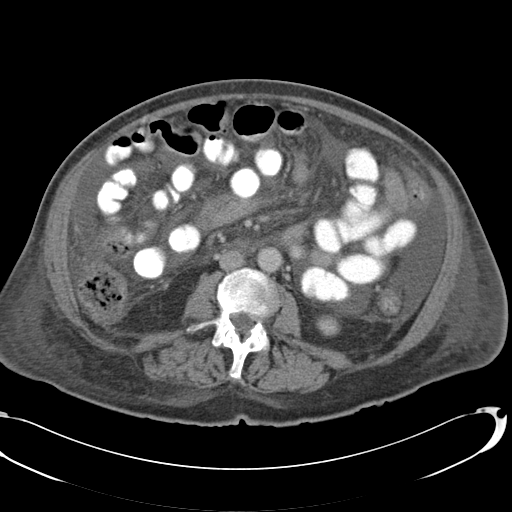
[im 64/99  soft-tissue]
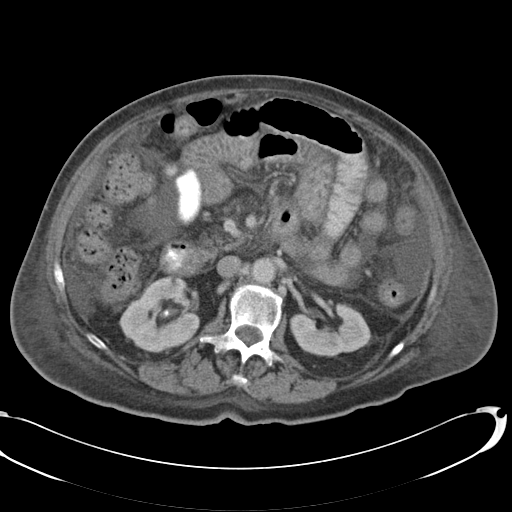
[im 64/99  bone]
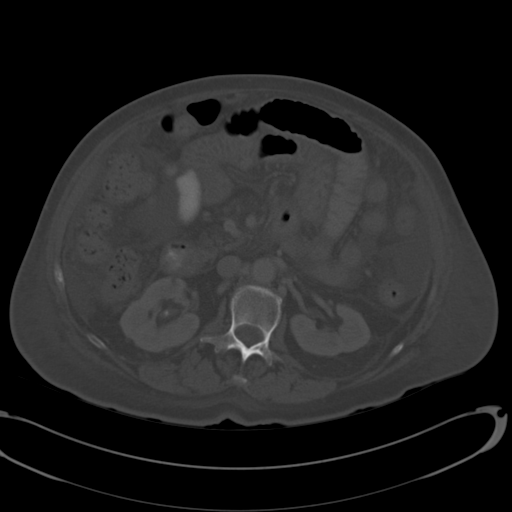
[im 69/99  soft-tissue]
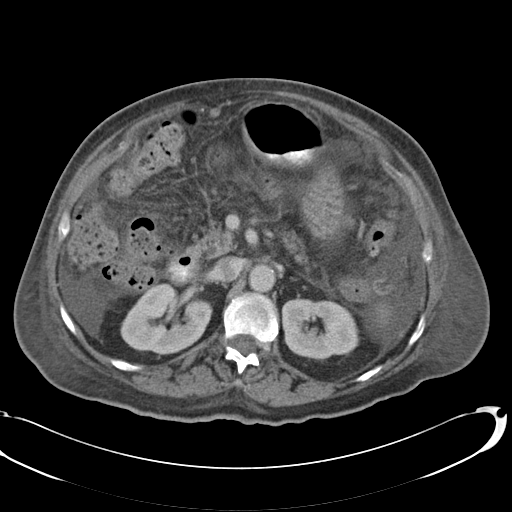
[im 79/99  soft-tissue]
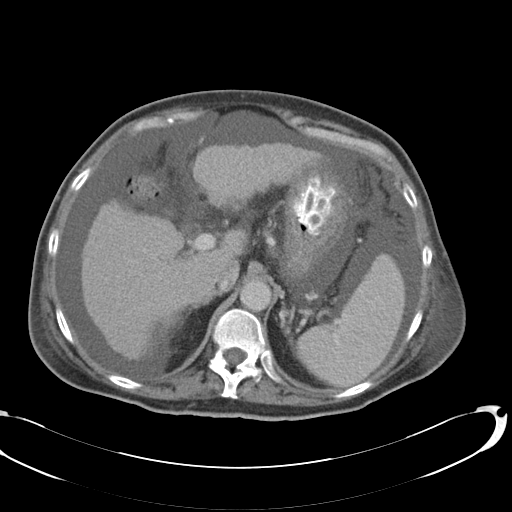
[im 84/99  soft-tissue]
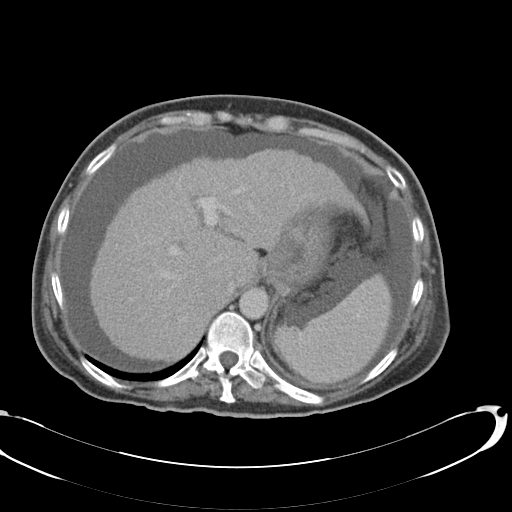
[im 94/99  soft-tissue]
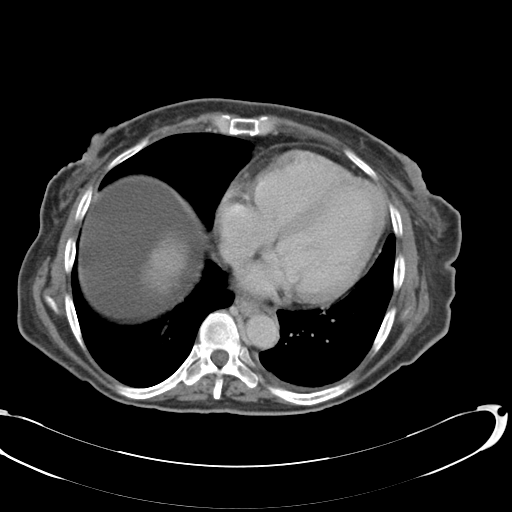

[Series 5: cor routine abd pel with · coronal · 0.71mm/px · 3 of 138 slices shown]
[im 46/138  soft-tissue]
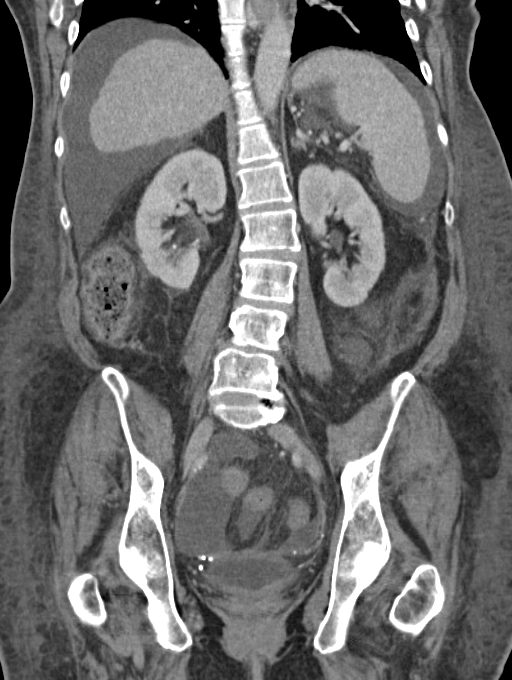
[im 61/138  soft-tissue]
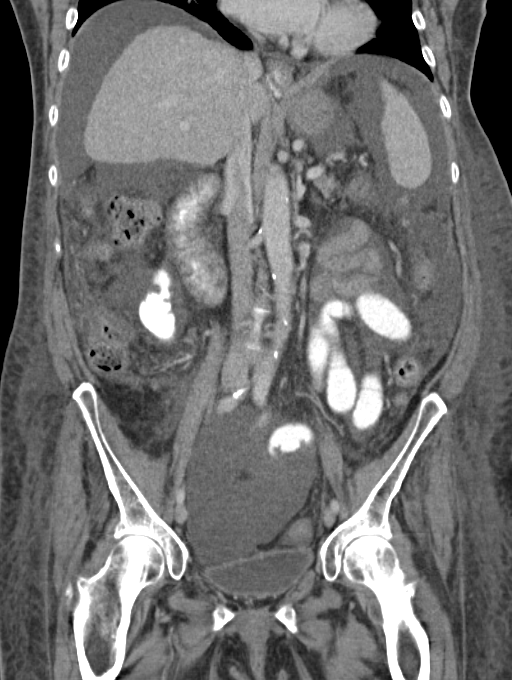
[im 77/138  soft-tissue]
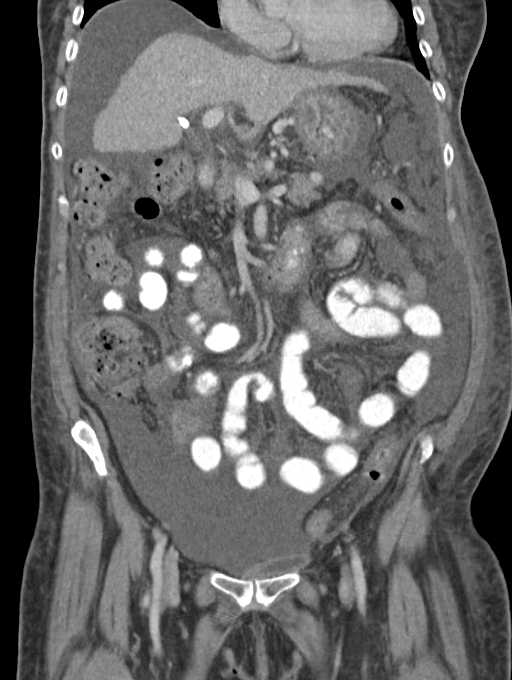

[16 of 46 positions shown; findings below may reference images not displayed]

FINDINGS: Visualized lung bases are unremarkable. No significant osseous
abnormality is noted.

Status post cholecystectomy. Hepatic cirrhosis is again noted
without focal abnormality. The spleen and pancreas are unremarkable.
Adrenal glands appear normal. Bilateral nonobstructive
nephrolithiasis is noted. No hydronephrosis or renal obstruction is
noted. The appendix appears normal. Mild ascites is noted. Mild
anasarca is noted. Mild dilatation of proximal small bowel is noted
with fold thickening suggesting enteritis. No definite evidence of
bowel obstruction is noted. Urinary bladder appears normal. Status
post hysterectomy. No significant adenopathy is noted.
IMPRESSION: Hepatic cirrhosis is noted with mild ascites.

Bilateral nonobstructive nephrolithiasis is noted.

Mild anasarca is noted.

Mild dilatation of proximal small bowel is noted with fold
thickening most consistent with enteritis.

## 2017-12-30 MED ORDER — POTASSIUM CHLORIDE 20 MEQ PO PACK
40.0000 meq | PACK | Freq: Once | ORAL | Status: AC
  Administered 2017-12-30: 10:00:00 40 meq via ORAL
  Filled 2017-12-30: qty 2

## 2017-12-30 MED ORDER — INSULIN DETEMIR 100 UNIT/ML FLEXPEN: 12.0000 [IU] | PEN_INJECTOR | Freq: Every evening | SUBCUTANEOUS | 1 refills | Status: DC

## 2017-12-30 MED ORDER — RIFAXIMIN 550 MG PO TABS: 550.0000 mg | ORAL_TABLET | Freq: Two times a day (BID) | ORAL | 1 refills | Status: DC

## 2017-12-30 MED ORDER — POTASSIUM CHLORIDE ER 20 MEQ PO TBCR: 20.0000 meq | EXTENDED_RELEASE_TABLET | Freq: Every day | ORAL | 1 refills | Status: DC

## 2017-12-30 MED ORDER — FLUCONAZOLE 200 MG PO TABS: 200.0000 mg | ORAL_TABLET | Freq: Every day | ORAL | 0 refills | Status: DC

## 2017-12-30 MED ORDER — PANTOPRAZOLE SODIUM 40 MG PO TBEC: 40.0000 mg | DELAYED_RELEASE_TABLET | Freq: Two times a day (BID) | ORAL | Status: DC

## 2017-12-30 MED ORDER — INSULIN ASPART 100 UNIT/ML FLEXPEN: PEN_INJECTOR | SUBCUTANEOUS | 1 refills | Status: DC

## 2017-12-30 MED ORDER — POTASSIUM CHLORIDE 20 MEQ PO PACK
20.0000 meq | PACK | Freq: Once | ORAL | Status: AC
  Administered 2017-12-30: 16:00:00 20 meq via ORAL
  Filled 2017-12-30: qty 1

## 2017-12-30 MED ORDER — FUROSEMIDE 40 MG PO TABS: 40.0000 mg | ORAL_TABLET | Freq: Every day | ORAL | 1 refills | Status: DC

## 2017-12-30 MED ORDER — SPIRONOLACTONE 100 MG PO TABS: 100.0000 mg | ORAL_TABLET | Freq: Every day | ORAL | 1 refills | Status: DC

## 2017-12-30 NOTE — Progress Notes (Addendum)
SLP Cancellation Note  Patient Details Name: [name] MRN: [id number] DOB: 1956/04/24   Cancelled treatment:       Reason Eval/Treat Not Completed: SLP screened, no needs identified, will sign off(chart reviewed; consulted NSG then met w/ pt). Pt admitted w/ GI bleed and concern for stroke initially. Per Neurology note, "Patient improved today.  MRI of the brain reviewed and shows no acute changes". She has mentioned visual issues(?). Met w/ pt who immediately asked if she would be able to go home today. She lives at home w/ her Husband. Pt denied any difficulty swallowing and is currently on a regular diet; tolerates swallowing pills w/ water per NSG. Pt conversed at conversational level w/out gross deficits noted; pt denied any new speech-language deficits. Recommend f/u w/ Neurology at PCP visit post discharge if any new concerns re: Cognitive-linguistic skills(from her baseline). No further skilled ST services indicated as pt appears at her baseline. Of note, pt reported s/s of Esophageal dysmotility and REFLUX complaints. General handouts given on Esophageal dysmotility w/ recommendation to f/u w/ PCP re: this. Pt agreed. NSG to reconsult if any change in status.     Orinda Kenner, MS, CCC-SLP Mehlani Blankenburg 12/30/2017, 11:20 AM

## 2017-12-30 NOTE — Progress Notes (Signed)
Clinical Social Worker (CSW) met with patient to discuss D/C plan. CSW refused SNF and stated that she is going home. Per patient she lives with her husband [name] and step son Remo Lipps in [place]. RN case manager aware of above. Please reconsult if future social work needs arise. CSW signing off.   [employer], LCSW [phone]

## 2017-12-30 NOTE — Care Management (Addendum)
Discharge to home today per Dr. Leroy Libman. Declines skilled nursing at this time.  Discussed home health services. [facility]. Floydene Flock, Advanced representative updated Shelbie Ammons RN MSN CCM Care Management [phone]

## 2017-12-30 NOTE — Discharge Summary (Addendum)
[facility] [place] NAME: [name]    MR#:  [id number]  DATE OF BIRTH:  [birth year]  DATE OF ADMISSION:  12/27/2017 ADMITTING PHYSICIAN: Demetrios Loll, MD  DATE OF DISCHARGE: 12/30/2017  PRIMARY CARE PHYSICIAN: Patient, No Pcp Per   ADMISSION DIAGNOSIS:  Melena and gastrointestinal bleeding Anemia of chronic disease Hyperglycemia secondary to poorly controlled diabetes mellitus Acute on chronic kidney disease stage III Cirrhosis of liver decompensated Paroxysmal atrial fibrillation  DISCHARGE DIAGNOSIS:  Gastrointestinal bleeding resolved Anemia of chronic disease Type 2 diabetes mellitus Chronic kidney disease stage III Cirrhosis of liver Esophageal candidiasis Esophageal tear status post clip placement Paroxysmal Atrial fibrillation  SECONDARY DIAGNOSIS:   Past Medical History:  Diagnosis Date  . Allergy   . Anxiety   . Cancer (HCC)    HX OF CANCER OF UTERUS   . Cirrhosis of liver not due to alcohol ([place]) 2016  . Degenerative disk disease   . Diverticulitis   . Gastroparesis   . GERD (gastroesophageal reflux disease)   . History of hiatal hernia   . Hypertension   . Hypothyroid   . Intussusception intestine ([place]) 05/2015  . PAF (paroxysmal atrial fibrillation) ([place]) 03/2015   a. new onset 03/2015 in setting of intractable N/V; b. on Eliquis 5 mg bid; c. CHADSVASc 4 (DM, TIA x 2, female)  . Pancreatitis   . Sick sinus syndrome ([place])   . Stomach ulcer   . Stroke [facility])    with minimal left sided weakness  . Syncope 01/2015  . TIA (transient ischemic attack) 02/2015  . Type 1 diabetes ([place])    on levemir     ADMITTING HISTORY [name]  is a 62 y.o. female with a known history of multiple medical problems as below.  The patient was just recently admitted for hyperglycemia and UTI.  She got 30-day supply and she said she is compliance to the medication.  But she comes to ED due to hyperglycemia.  She complains of  generalized weakness and melena for 1 week.  She has history of A. fib and taking Eliquis.  Hemoglobin decreased from 11.3, to 10.6 and to 8.7 today.  Stool occult is positive in the ED.  Dr. Owens Shark requested admission.  HOSPITAL COURSE:  Patient was admitted to the medical floor.  She was started on IV Protonix drip.  IV fluid hydration was given for acute on chronic kidney disease.  Patient was evaluated by gastroenterology and she had endoscopy procedure.  Patient had esophageal tear on endoscopy which was clipped successfully.  Patient was empirically put on IV vancomycin and ciprofloxacin antibiotics after the tear in esophagus.  Her hemoglobin was closely monitored she continued on IV Protonix drip for 24 hours.. During the hospitalization patient complained of weakness in the left upper and lower extremity and she was worked up for new stroke patient has history of CVA with left-sided weakness.. Patient was worked up with CT head, MRI brain which showed no evidence of new stroke except for old cerebellar strokes she was started back on diet and.  Started on anticoagulation with oral Eliquis which was resumed at her home dose for atrial fibrillation.  He esophageal candidiasis has been detected on the endoscopy and patient was started on oral fluconazole and will continue for 2 weeks patient will be on long-term proton pump inhibitor for gastrointestinal bleeding.  Patient was also seen by neurology attending Dr. and also during her stay in the hospital.  She was worked up with ultrasound abdomen in the hospital.  Ultrasound does not show any obstruction.   CONSULTS OBTAINED:  Treatment Team:  Lin Landsman, MD Catarina Hartshorn, MD  DRUG ALLERGIES:   Allergies  Allergen Reactions  . Hydrocodone Other (See Comments)    Pt states that this medication caused cirrhosis of the liver.    . Aspirin   . Erythromycin Other (See Comments)    Reaction:  Fever   . Prednisone Other (See Comments)     Reaction:  Unknown   . Rosiglitazone Maleate Swelling  . Codeine Sulfate Rash  . Tetanus-Diphtheria Toxoids Td Rash and Other (See Comments)    Reaction:  Fever     DISCHARGE MEDICATIONS:   Allergies as of 12/30/2017      Reactions   Hydrocodone Other (See Comments)   Pt states that this medication caused cirrhosis of the liver.     Aspirin    Erythromycin Other (See Comments)   Reaction:  Fever    Prednisone Other (See Comments)   Reaction:  Unknown    Rosiglitazone Maleate Swelling   Codeine Sulfate Rash   Tetanus-diphtheria Toxoids Td Rash, Other (See Comments)   Reaction:  Fever       Medication List    TAKE these medications   albuterol 108 (90 Base) MCG/ACT inhaler Commonly known as:  PROVENTIL HFA;VENTOLIN HFA Inhale 2 puffs into the lungs every 6 (six) hours as needed for wheezing or shortness of breath.   apixaban 5 MG Tabs tablet Commonly known as:  ELIQUIS Take 1 tablet (5 mg total) by mouth 2 (two) times daily.   atorvastatin 40 MG tablet Commonly known as:  LIPITOR Take 1 tablet (40 mg total) by mouth at bedtime.   budesonide-formoterol 160-4.5 MCG/ACT inhaler Commonly known as:  SYMBICORT Inhale 2 puffs into the lungs 2 (two) times daily.   cyclobenzaprine 10 MG tablet Commonly known as:  FLEXERIL Take 10 mg by mouth 3 (three) times daily as needed for muscle spasms.   fluconazole 200 MG tablet Commonly known as:  DIFLUCAN Take 1 tablet (200 mg total) by mouth daily. Start taking on:  12/31/2017   furosemide 40 MG tablet Commonly known as:  LASIX Take 1 tablet (40 mg total) by mouth daily. Start taking on:  12/31/2017   insulin aspart 100 UNIT/ML FlexPen Commonly known as:  NOVOLOG FLEXPEN Inject 5 units into the skin with meals for blood sugars above 130. What changed:    how much to take  how to take this  when to take this  reasons to take this  additional instructions   Insulin Detemir 100 UNIT/ML Pen Commonly known as:   LEVEMIR FLEXPEN Inject 12 Units into the skin every evening.   levothyroxine 75 MCG tablet Commonly known as:  SYNTHROID, LEVOTHROID Take 1 tablet (75 mcg total) by mouth daily before breakfast.   lisinopril 5 MG tablet Commonly known as:  PRINIVIL,ZESTRIL Take 1 tablet by mouth every morning for kidney protection against diabetes.   metoprolol tartrate 25 MG tablet Commonly known as:  LOPRESSOR Take 1 tablet (25 mg total) by mouth 2 (two) times daily.   Potassium Chloride ER 20 MEQ Tbcr Take 20 mEq by mouth daily.   rifaximin 550 MG Tabs tablet Commonly known as:  XIFAXAN Take 1 tablet (550 mg total) by mouth 2 (two) times daily.   spironolactone 100 MG tablet Commonly known as:  ALDACTONE Take 1 tablet (100 mg total) by mouth daily.  Start taking on:  12/31/2017   XTAMPZA ER 9 MG C12a Generic drug:  oxyCODONE ER Take 9 mg by mouth every 8 (eight) hours.       Today  Patient seen and evaluated today No chest pain no shortness of breath No new episodes of any GI bleed Tolerating diet well Improved strength in both the left upper and lower extremities Able to ambulate with the help of walker  VITAL SIGNS:  Blood pressure 112/70, pulse 97, temperature 97.8 F (36.6 C), temperature source Oral, resp. rate 18, height 5' 3"  (1.6 m), weight 69.9 kg (154 lb 1.6 oz), SpO2 100 %.  I/O:    Intake/Output Summary (Last 24 hours) at 12/30/2017 1501 Last data filed at 12/30/2017 0805 Gross per 24 hour  Intake 910.41 ml  Output 450 ml  Net 460.41 ml    PHYSICAL EXAMINATION:  Physical Exam  GENERAL:  62 y.o.-year-old patient lying in the bed with no acute distress.  LUNGS: Normal breath sounds bilaterally, no wheezing, rales,rhonchi or crepitation. No use of accessory muscles of respiration.  CARDIOVASCULAR: S1, S2 normal. No murmurs, rubs, or gallops.  ABDOMEN: Soft, non-tender, non-distended. Bowel sounds present. No organomegaly or mass.  NEUROLOGIC: Moves all 4  extremities. PSYCHIATRIC: The patient is alert and oriented x 3.  SKIN: No obvious rash, lesion, or ulcer.   DATA REVIEW:   CBC Recent Labs  Lab 12/29/17 0212  WBC 7.8  HGB 8.5*  HCT 26.5*  PLT 138*    Chemistries  Recent Labs  Lab 12/27/17 1836  12/30/17 0418  NA  --    < > 136  K  --    < > 3.5  CL  --    < > 108  CO2  --    < > 22  GLUCOSE  --    < > 180*  BUN  --    < > 10  CREATININE  --    < > 1.20*  CALCIUM  --    < > 7.5*  MG  --    < > 2.1  AST 21  --   --   ALT 18  --   --   ALKPHOS 128*  --   --   BILITOT 0.5  --   --    < > = values in this interval not displayed.    Cardiac Enzymes No results for input(s): TROPONINI in the last 168 hours.  Microbiology Results  Results for orders placed or performed during the hospital encounter of 12/27/17  KOH prep     Status: None   Collection Time: 12/28/17 12:30 PM  Result Value Ref Range Status   Specimen Description ESOPHAGUS  Final   Special Requests NONE  Final   KOH Prep   Final    YEAST WITH PSEUDOHYPHAE Performed at [facility], [address] [place], [place] [address]    Report Status 12/28/2017 FINAL  Final  Culture, blood (Routine X 2) w Reflex to ID Panel     Status: None (Preliminary result)   Collection Time: 12/29/17  2:12 AM  Result Value Ref Range Status   Specimen Description BLOOD BLOOD RIGHT HAND  Final   Special Requests   Final    BOTTLES DRAWN AEROBIC AND ANAEROBIC Blood Culture adequate volume   Culture   Final    NO GROWTH 1 DAY Performed at [facility], [address], [place], [place] [address]    Report Status PENDING  Incomplete  Culture, blood (Routine X 2) w Reflex to ID Panel     Status: None (Preliminary result)   Collection Time: 12/29/17  2:23 AM  Result Value Ref Range Status   Specimen Description BLOOD BLOOD LEFT HAND  Final   Special Requests   Final    BOTTLES DRAWN AEROBIC AND ANAEROBIC Blood Culture results may not be optimal due to  an inadequate volume of blood received in culture bottles   Culture   Final    NO GROWTH 1 DAY Performed at [facility], [address], [place], [place] [address]    Report Status PENDING  Incomplete    RADIOLOGY:  Ct Head Wo Contrast  Result Date: 12/29/2017 CLINICAL DATA:  Sudden onset left-sided weakness. EXAM: CT HEAD WITHOUT CONTRAST TECHNIQUE: Contiguous axial images were obtained from the base of the skull through the vertex without intravenous contrast. COMPARISON:  12/15/2017 head CT FINDINGS: Brain: No mass lesion, intraparenchymal hemorrhage or extra-axial collection. No evidence of acute cortical infarct. Normal appearance of the brain parenchyma and extra axial spaces for age. Vascular: No hyperdense vessel or unexpected vascular calcification. Skull: Normal visualized skull base, calvarium and extracranial soft tissues. Sinuses/Orbits: No sinus fluid levels or advanced mucosal thickening. No mastoid effusion. Normal orbits. IMPRESSION: Normal aging brain. Electronically Signed   By: Ulyses Jarred M.D.   On: 12/29/2017 00:31   Mr Jodene Nam Neck W Contrast  Result Date: 12/29/2017 CLINICAL DATA:  Left hemiparesis. EXAM: MR HEAD WITHOUT CONTRAST MR CIRCLE OF WILLIS WITHOUT CONTRAST MRA OF THE NECK WITHOUT AND WITH CONTRAST TECHNIQUE: Multiplanar, multiecho pulse sequences of the brain, circle of willis and surrounding structures were obtained without intravenous contrast. Angiographic images of the neck were obtained using MRA technique without and with intravenous contrast. CONTRAST:  7m MULTIHANCE GADOBENATE DIMEGLUMINE 529 MG/ML IV SOLN COMPARISON:  Head CT 12/29/2017. Brain MRI 12/29/2015. Carotid Doppler ultrasound 02/16/2015. FINDINGS: MR HEAD FINDINGS Brain: There is no evidence of acute infarct, intracranial hemorrhage, mass, midline shift, or extra-axial fluid collection. Mild cerebral atrophy is not greater than expected for age. Scattered small foci of cerebral white  matter T2 hyperintensity have slightly progressed from the prior MRI and are nonspecific but compatible with mild chronic small vessel ischemic disease. A chronic right periatrial white matter lacunar infarct is new from the prior MRI. There are also a few new subcentimeter chronic right cerebellar infarcts. Vascular: Major intracranial vascular flow voids are preserved. Skull and upper cervical spine: Unremarkable bone marrow signal. Sinuses/Orbits: Unremarkable orbits. Trace bilateral mastoid effusions. Clear paranasal sinuses. Other: None. MR CIRCLE OF WILLIS FINDINGS The visualized distal vertebral arteries are widely patent to the basilar. Patent right PICA, left AICA, and bilateral SCA origins are identified. The basilar artery is widely patent. There is a large right posterior communicating artery with diminutive right P1 segment. Both PCAs are patent without evidence of significant proximal stenosis. There is some asymmetric attenuation of distal right PCA branch vessels. The internal carotid arteries are patent from skull base to carotid termini without evidence of stenosis. ACAs and MCAs are patent without evidence of proximal branch occlusion or significant proximal stenosis. No aneurysm is identified. MRA NECK FINDINGS There is a standard 3 vessel aortic arch. The brachiocephalic and subclavian arteries are widely patent. The carotid arteries are patent without evidence of dissection or stenosis. The vertebral arteries are patent and codominant with antegrade flow bilaterally and no evidence of dissection or significant stenosis. IMPRESSION: 1. No acute intracranial abnormality. 2.  Mild chronic small vessel ischemic disease. 3. Small chronic cerebellar infarcts, new from 2017. 4. Patent circle of Willis without significant proximal stenosis. 5. Negative neck MRA. Electronically Signed   By: Logan Bores M.D.   On: 12/29/2017 13:10   Mr Brain Wo Contrast  Result Date: 12/29/2017 CLINICAL DATA:  Left  hemiparesis. EXAM: MR HEAD WITHOUT CONTRAST MR CIRCLE OF WILLIS WITHOUT CONTRAST MRA OF THE NECK WITHOUT AND WITH CONTRAST TECHNIQUE: Multiplanar, multiecho pulse sequences of the brain, circle of willis and surrounding structures were obtained without intravenous contrast. Angiographic images of the neck were obtained using MRA technique without and with intravenous contrast. CONTRAST:  36m MULTIHANCE GADOBENATE DIMEGLUMINE 529 MG/ML IV SOLN COMPARISON:  Head CT 12/29/2017. Brain MRI 12/29/2015. Carotid Doppler ultrasound 02/16/2015. FINDINGS: MR HEAD FINDINGS Brain: There is no evidence of acute infarct, intracranial hemorrhage, mass, midline shift, or extra-axial fluid collection. Mild cerebral atrophy is not greater than expected for age. Scattered small foci of cerebral white matter T2 hyperintensity have slightly progressed from the prior MRI and are nonspecific but compatible with mild chronic small vessel ischemic disease. A chronic right periatrial white matter lacunar infarct is new from the prior MRI. There are also a few new subcentimeter chronic right cerebellar infarcts. Vascular: Major intracranial vascular flow voids are preserved. Skull and upper cervical spine: Unremarkable bone marrow signal. Sinuses/Orbits: Unremarkable orbits. Trace bilateral mastoid effusions. Clear paranasal sinuses. Other: None. MR CIRCLE OF WILLIS FINDINGS The visualized distal vertebral arteries are widely patent to the basilar. Patent right PICA, left AICA, and bilateral SCA origins are identified. The basilar artery is widely patent. There is a large right posterior communicating artery with diminutive right P1 segment. Both PCAs are patent without evidence of significant proximal stenosis. There is some asymmetric attenuation of distal right PCA branch vessels. The internal carotid arteries are patent from skull base to carotid termini without evidence of stenosis. ACAs and MCAs are patent without evidence of proximal  branch occlusion or significant proximal stenosis. No aneurysm is identified. MRA NECK FINDINGS There is a standard 3 vessel aortic arch. The brachiocephalic and subclavian arteries are widely patent. The carotid arteries are patent without evidence of dissection or stenosis. The vertebral arteries are patent and codominant with antegrade flow bilaterally and no evidence of dissection or significant stenosis. IMPRESSION: 1. No acute intracranial abnormality. 2. Mild chronic small vessel ischemic disease. 3. Small chronic cerebellar infarcts, new from 2017. 4. Patent circle of Willis without significant proximal stenosis. 5. Negative neck MRA. Electronically Signed   By: ALogan BoresM.D.   On: 12/29/2017 13:10   [place]Carotid Bilateral  Result Date: 12/29/2017 CLINICAL DATA:  62year old female with stroke-like symptoms EXAM: BILATERAL CAROTID DUPLEX ULTRASOUND TECHNIQUE: GPearline Cablesscale imaging, color Doppler and duplex ultrasound were performed of bilateral carotid and vertebral arteries in the neck. COMPARISON:  Brain MRI 12/30/2007 FINDINGS: Criteria: Quantification of carotid stenosis is based on velocity parameters that correlate the residual internal carotid diameter with NASCET-based stenosis levels, using the diameter of the distal internal carotid lumen as the denominator for stenosis measurement. The following velocity measurements were obtained: RIGHT ICA: 49/21 cm/sec CCA: 434/19cm/sec SYSTOLIC ICA/CCA RATIO:  1.2 ECA:  47 cm/sec LEFT ICA: 87/31 cm/sec CCA: 362/22cm/sec SYSTOLIC ICA/CCA RATIO:  2.5 ECA:  54 cm/sec RIGHT CAROTID ARTERY: Trace heterogeneous atherosclerotic plaque in the distal common carotid artery. No significant plaque or evidence of stenosis in the internal carotid artery. RIGHT VERTEBRAL ARTERY:  Patent with normal antegrade  flow. LEFT CAROTID ARTERY: Mild heterogeneous atherosclerotic plaque in the distal common carotid artery. No evidence of significant plaque or stenosis in the  internal carotid artery. LEFT VERTEBRAL ARTERY:  Patent with normal antegrade flow. IMPRESSION: 1. No evidence of significant atherosclerotic plaque or stenosis in either internal carotid artery. 2. Mild heterogeneous atherosclerotic plaque in the distal bilateral common carotid arteries. 3. Vertebral arteries are patent with normal antegrade flow. Signed, Criselda Peaches, MD [facility] Electronically Signed   By: Jacqulynn Cadet M.D.   On: 12/29/2017 14:15   Dg Chest Port 1 View  Result Date: 12/28/2017 CLINICAL DATA:  Central chest pain EXAM: PORTABLE CHEST 1 VIEW COMPARISON:  12/28/2017 FINDINGS: Low lung volumes. Left basilar atelectasis or infiltrate slightly improved. Improving airspace opacity in the right lung base. No visible effusions. Heart is normal size. IMPRESSION: Improving bibasilar opacities. Residual left basilar atelectasis or infiltrate. Electronically Signed   By: Rolm Baptise M.D.   On: 12/28/2017 23:44   Mr Jodene Nam Head Wo Contrast  Result Date: 12/29/2017 CLINICAL DATA:  Left hemiparesis. EXAM: MR HEAD WITHOUT CONTRAST MR CIRCLE OF WILLIS WITHOUT CONTRAST MRA OF THE NECK WITHOUT AND WITH CONTRAST TECHNIQUE: Multiplanar, multiecho pulse sequences of the brain, circle of willis and surrounding structures were obtained without intravenous contrast. Angiographic images of the neck were obtained using MRA technique without and with intravenous contrast. CONTRAST:  26m MULTIHANCE GADOBENATE DIMEGLUMINE 529 MG/ML IV SOLN COMPARISON:  Head CT 12/29/2017. Brain MRI 12/29/2015. Carotid Doppler ultrasound 02/16/2015. FINDINGS: MR HEAD FINDINGS Brain: There is no evidence of acute infarct, intracranial hemorrhage, mass, midline shift, or extra-axial fluid collection. Mild cerebral atrophy is not greater than expected for age. Scattered small foci of cerebral white matter T2 hyperintensity have slightly progressed from the prior MRI  and are nonspecific but compatible with mild chronic small vessel ischemic disease. A chronic right periatrial white matter lacunar infarct is new from the prior MRI. There are also a few new subcentimeter chronic right cerebellar infarcts. Vascular: Major intracranial vascular flow voids are preserved. Skull and upper cervical spine: Unremarkable bone marrow signal. Sinuses/Orbits: Unremarkable orbits. Trace bilateral mastoid effusions. Clear paranasal sinuses. Other: None. MR CIRCLE OF WILLIS FINDINGS The visualized distal vertebral arteries are widely patent to the basilar. Patent right PICA, left AICA, and bilateral SCA origins are identified. The basilar artery is widely patent. There is a large right posterior communicating artery with diminutive right P1 segment. Both PCAs are patent without evidence of significant proximal stenosis. There is some asymmetric attenuation of distal right PCA branch vessels. The internal carotid arteries are patent from skull base to carotid termini without evidence of stenosis. ACAs and MCAs are patent without evidence of proximal branch occlusion or significant proximal stenosis. No aneurysm is identified. MRA NECK FINDINGS There is a standard 3 vessel aortic arch. The brachiocephalic and subclavian arteries are widely patent. The carotid arteries are patent without evidence of dissection or stenosis. The vertebral arteries are patent and codominant with antegrade flow bilaterally and no evidence of dissection or significant stenosis. IMPRESSION: 1. No acute intracranial abnormality. 2. Mild chronic small vessel ischemic disease. 3. Small chronic cerebellar infarcts, new from 2017. 4. Patent circle of Willis without significant proximal stenosis. 5. Negative neck MRA. Electronically Signed   By: ALogan BoresM.D.   On: 12/29/2017 13:10   [place]domen Limited Ruq  Result Date: 12/30/2017 CLINICAL DATA:  Cirrhosis.  Previous cholecystectomy. EXAM: ULTRASOUND ABDOMEN LIMITED  RIGHT  UPPER QUADRANT COMPARISON:  Normal liver Doppler study 12/28/2017. FINDINGS: Gallbladder: Surgically absent Common bile duct: Diameter: 3 mm, unremarkable Liver: Coarse echotexture, difficult to penetrate. No focal lesion identified. Antegrade flow in the portal vein is again documented. IMPRESSION: 1. Coarse hepatic echotexture without focal lesion or biliary ductal dilatation. Electronically Signed   By: Lucrezia Europe M.D.   On: 12/30/2017 09:38    Follow up with PCP in 1 week.  Management plans discussed with the patient, family and they are in agreement.  CODE STATUS:     Code Status Orders  (From admission, onward)        Start     Ordered   12/27/17 1245  Full code  Continuous     12/27/17 1244    Code Status History    Date Active Date Inactive Code Status Order ID Comments User Context   12/15/2017 0501 12/17/2017 1854 Full Code [id number]  Harrie Foreman, MD Inpatient   08/06/2016 2036 08/09/2016 1945 Full Code [id number]  Vaughan Basta, MD ED   07/14/2016 0426 07/17/2016 1934 Full Code [id number]  Harrie Foreman, MD Inpatient   07/09/2016 0453 07/10/2016 1708 Full Code [id number]  Lance Coon, MD Inpatient   06/17/2016 2245 06/20/2016 1509 Full Code [id number]  Lance Coon, MD Inpatient   06/02/2016 0546 06/06/2016 1821 DNR [id number]  Harrie Foreman, MD Inpatient   05/12/2016 1143 05/16/2016 1638 DNR [id number]  Dustin Flock, MD Inpatient   05/12/2016 0113 05/12/2016 1143 Full Code [id number]  Sham[place], Mamou, DO Inpatient   04/07/2016 0618 04/09/2016 1419 DNR [id number]  Harrie Foreman, MD Inpatient   03/13/2016 2125 03/16/2016 1832 DNR [id number]  Lance Coon, MD Inpatient   02/04/2016 0939 02/07/2016 1459 DNR [id number]  Saundra Shelling, MD Inpatient   01/21/2016 2224 01/25/2016 1702 DNR [id number]  Lance Coon, MD Inpatient   01/15/2016 1339 01/19/2016 1529 DNR [id number]  Gladstone Lighter, MD ED   01/10/2016 2200 01/13/2016 1724 DNR [id number]  Harrie Foreman, MD  Inpatient   12/27/2015 2302 01/01/2016 1817 DNR [id number]  Max Sane, MD Inpatient   12/16/2015 1654 12/19/2015 1652 DNR [id number]  Loletha Grayer, MD ED   11/11/2015 1136 11/16/2015 1405 Full Code [id number]  Samella Parr, NP Inpatient   10/15/2015 0123 10/16/2015 1817 Full Code [id number]  Lance Coon, MD Inpatient   10/04/2015 2019 10/08/2015 1834 Full Code [id number]  Bethena Roys, MD Inpatient   08/15/2015 0105 08/16/2015 2004 Full Code [id number]  Harrie Foreman, MD Inpatient   08/01/2015 0125 08/06/2015 1741 Full Code [id number]  Harrie Foreman, MD Inpatient   07/04/2015 0253 07/11/2015 1833 Full Code [id number]  Lytle Butte, MD ED   05/27/2015 2248 05/31/2015 1437 Full Code [id number]  Rise Patience, MD Inpatient   05/19/2015 0114 05/22/2015 1628 Full Code [id number]  Rise Patience, MD Inpatient   04/05/2015 0916 04/07/2015 1723 Full Code [id number]  Bettey Costa, MD Inpatient   04/03/2015 1231 04/05/2015 0916 Full Code [id number]  Bettey Costa, MD Inpatient   03/11/2015 1649 03/14/2015 1550 Full Code [id number]  Dustin Flock, MD Inpatient   02/15/2015 1651 02/17/2015 1433 DNR [id number]  Dustin Flock, MD Inpatient   02/15/2015 1635 02/15/2015 1651 Full Code [id number]  Dustin Flock, MD Inpatient   01/11/2015 0240 01/12/2015 1811 Full Code [id number]  Etta Quill, DO ED      TOTAL TIME TAKING CARE OF THIS PATIENT ON DAY OF DISCHARGE: more than 35 minutes.   Saundra Shelling M.D on 12/30/2017  at 3:01 PM  Between 7am to 6pm - Pager - [phone]  After 6pm go to [email or web address] - password [email or web address]  Office  [phone]  CC: Primary care physician; Patient, No Pcp Per  Note: This dictation was prepared with Dragon dictation along with smaller phrase technology. Any transcriptional errors that result from this process are unintentional.

## 2017-12-30 NOTE — Consult Note (Signed)
PHARMACY CONSULT   Electrolyte Monitoring and Replacement   Assessment: K 3.5, Mag 2.1- Patient is also receiving Furosemide 23m QD.   Goal K>4, Mg >2.  Will give KCl 40 mEq PO x1 this morning, and KCL 244m x 1 this evening.  Recheck in AM  Recommended checking an EKG.  ShPernell DuprePharmD, BCPS Clinical Pharmacist 12/30/2017 7:10 AM

## 2017-12-30 NOTE — Evaluation (Addendum)
Occupational Therapy Evaluation Patient Details Name: [name] MRN: [id number] DOB: [birth year] Today's Date: 12/30/2017    History of Present Illness 62 y/o female, noted was admitted with L side weakness and atelectasis and developed an esophageal tear.  PMHx:  UTI, sepsis, hyperglycemia, DM, NASH, cirrhosis, hepatic encephalopathy, GI bleed, PUD.   Clinical Impression   Pt seen for OT evaluation this date. Prior to hospital admission, pt was independent living with spouse (both on disability) and 64yo step son (works during the day) in a mobile home with ramped entrance and narrow hallways. Pt reports taking tub baths as her preference but does have a shower chair she could use in the tub/shower. Pt endorses >20 falls in past 6 months as a result of a loss of balance. Pt reportedly wears non skid socks t/o the day. She denies using her SPC or RW all the time, stating she uses a SPC/RW "not often" either in the home or out in the community. Pt reports she is unable to get up after falling and requires assist from her step son. Currently pt demonstrates impairments in strength grossly BUE/BLE, balance (sitting and standing), activity tolerance, awareness (of safety and insight of deficits), and problem solving (particularly with multi tasking). (Addendum: pt HR noted as high as 124 during functional mobility, back down to 116 supine at rest). Educated pt on OT recommendation and pt states, "I just want to go home.  I just want to try go home first, without any care in the home, to see how my husband and I can do." Pt educated in falls risk factors and strategies to minimize falls risk. Pt verbalizes understanding but will require additional reinforcement. Pt would benefit from skilled OT to address noted impairments and functional limitations (see below for any additional details).  Upon hospital discharge, recommend pt discharge to [place].    Follow Up Recommendations  SNF    Equipment  Recommendations  None recommended by OT    Recommendations for Other Services       Precautions / Restrictions Precautions Precautions: Fall Restrictions Weight Bearing Restrictions: No      Mobility Bed Mobility Overal bed mobility: Needs Assistance Bed Mobility: Supine to Sit;Sit to Supine     Supine to sit: Min guard Sit to supine: Min guard      Transfers Overall transfer level: Needs assistance Equipment used: 1 person hand held assist;Rolling walker (2 wheeled) Transfers: Sit to/from Stand Sit to Stand: Min guard         General transfer comment: verbal cues for safety and sequencing    Balance Overall balance assessment: Needs assistance Sitting-balance support: Feet supported;Bilateral upper extremity supported Sitting balance-Leahy Scale: Fair Sitting balance - Comments: poor+ balance without BUE support   Standing balance support: Bilateral upper extremity supported Standing balance-Leahy Scale: Poor                             ADL either performed or assessed with clinical judgement   ADL Overall ADL's : Needs assistance/impaired Eating/Feeding: Bed level;Independent   Grooming: Sitting;Set up;Supervision/safety   Upper Body Bathing: Sitting;Set up;Supervision/ safety   Lower Body Bathing: Sit to/from stand;Minimal assistance Lower Body Bathing Details (indicate cue type and reason): pt encouraged to take a seated shower instead of a tub bath to improve independence and safety, as pt has extensive falls history and has historically been unable to get up after a fall. Upper Body Dressing :  Sitting;Set up;Supervision/safety   Lower Body Dressing: Sit to/from stand;Minimal assistance   Toilet Transfer: BSC;Ambulation;Cueing for safety;Min guard Toilet Transfer Details (indicate cue type and reason): CGA and +1 HHA to perform with impaired problem solving, initially attempting to approach BSC from front and towards the L which required  additional mobility than simply turning to her right  Toileting- Clothing Manipulation and Hygiene: Sit to/from stand;Min guard Toileting - Clothing Manipulation Details (indicate cue type and reason): impaired problem solving when attempting to dispose of toilet paper after hygiene, initially stating "I'll just toss it in the trash" and with verbal cue to drop in toilet, pt became distracted by IV on R hand and dropped the toilet paper.             Vision Baseline Vision/History: Wears glasses Wears Glasses: At all times(not on at time of assessment) Patient Visual Report: No change from baseline Vision Assessment?: No apparent visual deficits     Perception     Praxis      Pertinent Vitals/Pain Pain Assessment: No/denies pain     Hand Dominance Right   Extremity/Trunk Assessment Upper Extremity Assessment Upper Extremity Assessment: Generalized weakness(grossly weak, 4/5 bilaterally, sensation intact)   Lower Extremity Assessment Lower Extremity Assessment: Generalized weakness(grossly weak at least 4-/5 bilaterally)   Cervical / Trunk Assessment Cervical / Trunk Assessment: Normal   Communication Communication Communication: No difficulties   Cognition Arousal/Alertness: Awake/alert Behavior During Therapy: WFL for tasks assessed/performed Overall Cognitive Status: No family/caregiver present to determine baseline cognitive functioning                                 General Comments: Pt A&Ox4, follows all commands (increased processing time when attention is divided), and mildly impaired safety awareness/awareness of deficits/problem solving noted t/o session.   General Comments       Exercises Other Exercises Other Exercises: Pt educated in falls prevention strategies and problem solving to minimize her falls risk. Educated in risk factors. Pt verbalizes understanding but does not demonstrate carryover of learned strategies during mobility and ADL  tasks during session.   Shoulder Instructions      Home Living Family/patient expects to be discharged to:: Private residence Living Arrangements: Spouse/significant other Available Help at Discharge: Family Type of Home: Mobile home Home Access: [place]: One level     Bathroom Shower/Tub: Teacher, early years/pre: [place]: Environmental consultant - 2 wheels;Shower seat;Cane - single point   Additional Comments: per previous chart review, RW cannot fit down hallway of mobile home      Prior Functioning/Environment Level of Independence: Independent with assistive device(s)  Gait / Transfers Assistance Needed: pt ambulates using RW/SPC "not often" in the home and not at all in the community ADL's / Homemaking Assistance Needed: Pt reports indep with basic ADL, med mgt, and meals   Comments: Pt endorses >20 falls in past 6 months due to LOB        OT Problem List: Decreased strength;Decreased activity tolerance;Decreased cognition;Impaired balance (sitting and/or standing);Decreased safety awareness;Decreased knowledge of use of DME or AE      OT Treatment/Interventions: Self-care/ADL training;Balance training;Therapeutic exercise;Therapeutic activities;Cognitive remediation/compensation;DME and/or AE instruction;Patient/family education    OT Goals(Current goals can be found in the care plan section) Acute Rehab OT Goals Patient Stated Goal: to go home OT Goal Formulation: With patient Time For  Goal Achievement: 01/13/18 Potential to Achieve Goals: Good ADL Goals Pt Will Perform Lower Body Dressing: with supervision;sit to/from stand(implementing learned falls prevention strategies) Pt Will Transfer to Toilet: with supervision;ambulating(LRAD for amb, implement learned falls prevention strategies) Additional ADL Goal #1: Pt will verbalize plan to implement at least 2 strategies she can incoporate into her daily routine to minimize her  falls risk.  OT Frequency: Min 1X/week   Barriers to D/C:            Co-evaluation              AM-PAC PT "6 Clicks" Daily Activity     Outcome Measure Help from another person eating meals?: None Help from another person taking care of personal grooming?: A Little Help from another person toileting, which includes using toliet, bedpan, or urinal?: A Little Help from another person bathing (including washing, rinsing, drying)?: A Little Help from another person to put on and taking off regular upper body clothing?: A Little Help from another person to put on and taking off regular lower body clothing?: A Little 6 Click Score: 19   End of Session    Activity Tolerance: Patient tolerated treatment well Patient left: in bed;with call bell/phone within reach;with bed alarm set  OT Visit Diagnosis: Unsteadiness on feet (R26.81);Repeated falls (R29.6);Muscle weakness (generalized) (M62.81)                Time: 4098-1191 OT Time Calculation (min): 39 min Charges:  OT General Charges $OT Visit: 1 Visit OT Evaluation $OT Eval Moderate Complexity: 1 Mod OT Treatments $Self Care/Home Management : 23-37 mins  Jeni Salles, MPH, MS, OTR/L ascom [phone] 12/30/17, 11:58 AM

## 2017-12-30 NOTE — Progress Notes (Signed)
OT Cancellation Note  Patient Details Name: MAKEYLA GOVAN MRN: [id number] DOB: 09-15-1955   Cancelled Treatment:    Reason Eval/Treat Not Completed: Other (comment). Order received, chart reviewed. Pt unavailable, SLP performing assessment. Will re-attempt OT evaluation at later date/time as pt is available and medically appropriate.   Jeni Salles, MPH, MS, OTR/L ascom [phone] 12/30/17, 10:42 AM

## 2017-12-30 NOTE — Discharge Planning (Signed)
Patient IV and tele removed.  RN assessment and VS revealed stability for DC to home with HH (RN, PT, OT).  Discharge papers given,, explained and educated.  Informed of suggested FU appts but only appt with PCP able to be set up.  Others are new patient appts and PCP will need to arrange at hospital FU appt (set for 4/23). - Patient aware.  Scripts printed and given.  Once ready and ride arrives, will be wheeled to front and family transporting home via car. Waiting on ride to arrive.

## 2017-12-30 NOTE — Progress Notes (Signed)
Inpatient Diabetes Program Recommendations  AACE/ADA: New Consensus Statement on Inpatient Glycemic Control (2015)  Target Ranges:  Prepandial:   less than 140 mg/dL      Peak postprandial:   less than 180 mg/dL (1-2 hours)      Critically ill patients:  140 - 180 mg/dL    Review of Glycemic Control  Diabetes history: DM2 Outpatient Diabetes medications: Levemir 11 units QHS, Novolog 5 units TID with meals Current orders for Inpatient glycemic control: Levemir 12 units QHs, Novolog 3 units TID with meals, Novolog 0-9 units TID with meals, Novolog 0-5 units QHS  Inpatient Diabetes Program Recommendations: HgbA1C: A1C 14.1% on 12/29/17 indicating an average glucose of 358 mg/dl over the past 2-3 months. Patient states that she has been out of Levemir and Novolog for awhile. Patient states that her copays for insulin are affordable but she needs a new prescription for Levemir and Novolog insulin pens.  NOTE: Spoke with patient about diabetes and home regimen for diabetes control. Patient reports that she is followed by PCP for diabetes management and she had an appointment scheduled with PCP today which she could not make due to being in the hospital. Patient states that she was taking Levemir and Novolog (insulin pens) but she has been out of both insulins for awhile now. Inquired about why she was out and she states that she has to see her doctor before they can be refilled. Patient stated that she was not sure why she was on 2 insulin anyway. Discussed Levemir and Novolog and explained how they work differently to help keep DM controlled. Inquired about ability to afford medications and patient states that her copays for her insulin are affordable ($3-$10.50) and she would be able to afford to get them filled if she had a prescription. Patient reports that she has testing supplies at home already. Discussed A1C results (14.1% on 12/29/17) and explained that her current A1C indicates an average  glucose of 358 mg/dl over the past 2-3 months. Discussed glucose and A1C goals. Discussed importance of checking CBGs and maintaining good CBG control to prevent long-term and short-term complications.  Stressed to the patient the importance of improving glycemic control to prevent further complications from uncontrolled diabetes. Patient states that she knows she has to do a better job with getting DM controlled and following up with PCP.  Encouraged patient to check her glucose 4 times per day (before meals and at bedtime) and to keep a log book of glucose readings and insulin taken which she will need to take to doctor appointments. Explained how the doctor she follows up with can use the log book to continue to make insulin adjustments if needed. Patient verbalized understanding of information discussed and she states that she has no further questions at this time related to diabetes. At time of discharge, please provide patient with Rx for Levemir and Novolog insulin pens and insulin pen needles. Sent page to MD and discussed with Rodman Key, RN.  Thanks, Barnie Alderman, RN, MSN, CDE Diabetes Coordinator Inpatient Diabetes Program [phone] (Team Pager) '

## 2017-12-30 NOTE — Progress Notes (Signed)
Advanced care plan.  Purpose of the Encounter: CODE STATUS  Parties in Attendance:Patient  Patient's Decision Capacity:Good  Subjective/Patient's story: Presented with GI bleed anemia Objective/Medical story Admitted for endoscopy and management of cirrhosis of liver Goals of care determination:  Advance directives were discussed during her stay Wants full resuscitation, intubation, ventilator for now CODE STATUS: Full code Time spent discussing advanced care planning: 16 minutes

## 2017-12-31 ENCOUNTER — Inpatient Hospital Stay: Payer: Medicare HMO | Admitting: Primary Care

## 2017-12-31 DIAGNOSIS — Z2089 Contact with and (suspected) exposure to other communicable diseases: Secondary | ICD-10-CM

## 2017-12-31 LAB — SURGICAL PATHOLOGY

## 2018-01-01 ENCOUNTER — Telehealth: Payer: Self-pay | Admitting: Primary Care

## 2018-01-01 ENCOUNTER — Telehealth: Payer: Self-pay

## 2018-01-01 DIAGNOSIS — R531 Weakness: Secondary | ICD-10-CM | POA: Diagnosis not present

## 2018-01-01 DIAGNOSIS — I6789 Other cerebrovascular disease: Secondary | ICD-10-CM | POA: Diagnosis not present

## 2018-01-01 NOTE — Telephone Encounter (Signed)
Contacted by agent that home care is at home with patient multiple problems( elevated BP and P-146...) and they want to make patient an appointment- when I got on the line- it is actually EMS. They state patient is refusing to go with them and she is having trouble getting her medication. They want to know if the patient is taking Lasix because they are concerned about some swelling she is having. Put them on hold to call office because I can't answer all of their questions and certainly can't make that appointment.  Call to [facility]- she wanted  information and when I went to conference EMS- they were not on the line. Called patient number to speak to patient or EMS- female answered- I told him I was calling from her PCP to get information from them and was told to call back this afternoon.I could here commotion in the background and the EMS are still at the home. They are probably trying to convince patient to go with them.

## 2018-01-01 NOTE — Telephone Encounter (Signed)
Noted. Agree that she go to hospital if paramedics are recommending. It appears that Lasix was initiated during her most recent hospital stay for "volume overload", also with spironolactone.  I have not seen patient in over one year as she's failed to follow up per my recommendations, she no showed her hospital follow up appointment yesterday. Not sure if she's seeing cardiology, doesn't appear so from her chart. Echocardiogram with LVEF of 55-60%, did not assess for diastolic function, no pulmonary hypertension.

## 2018-01-01 NOTE — Telephone Encounter (Signed)
Jane RN at [facility] said rescue is at pts home; heart rate 146. Elevated BP; pt refusing to go to ED. Opal Sidles went back on line to ask if any CP etc and rescue had hung up. Opal Sidles skyped me that she called pts home to do triage and a female answered and Opal Sidles heard commotion in background and the man asked that Opal Sidles cb later; thinks rescue still there. Opal Sidles will do triage note and send over. FYI to [name] NP.

## 2018-01-01 NOTE — Telephone Encounter (Signed)
Noted. Sounds like she is in need of immediate medical attention, glad paramedics are on location. She no showed her hospital follow up with me yesterday, has a history of cancellations and no shows.

## 2018-01-02 IMAGING — CT CT RENAL STONE PROTOCOL
3 of 4 series · 9 of 46 positions shown, 14 images · non-contrast
Comparison: 02/06/2016

CLINICAL DATA: Acute mid abdominal pain radiating to the back with
nausea, vomiting and diarrhea. History of cirrhosis and ascites.

EXAM:
CT ABDOMEN AND PELVIS WITHOUT CONTRAST
TECHNIQUE: Multidetector CT imaging of the abdomen and pelvis was performed
following the standard protocol without IV contrast.

[Series 4: lung · axial · 0.70mm/px · z∈[-617,-522]mm · 5 of 29 slices shown, 10 images]
[im 5/29  soft-tissue]
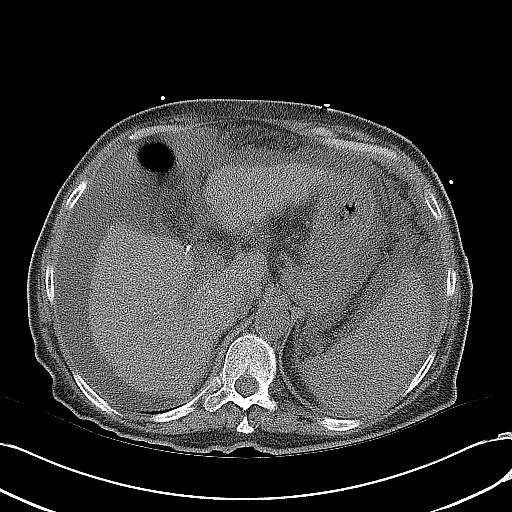
[im 5/29  bone]
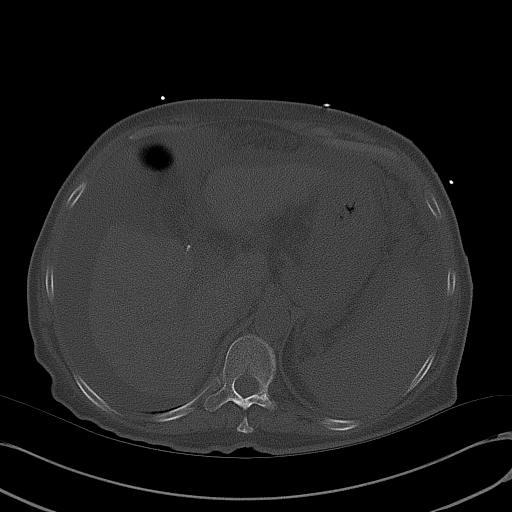
[im 10/29  soft-tissue]
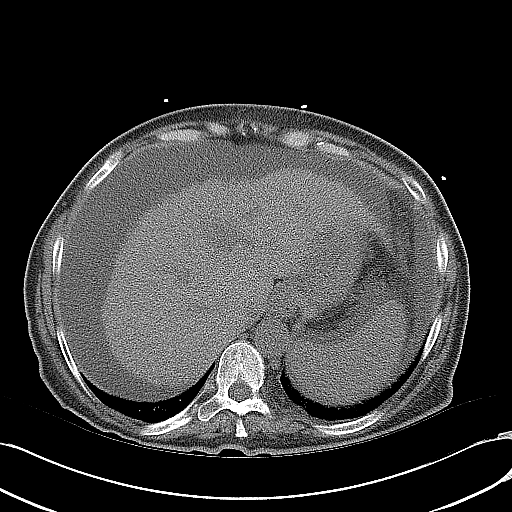
[im 10/29  lung]
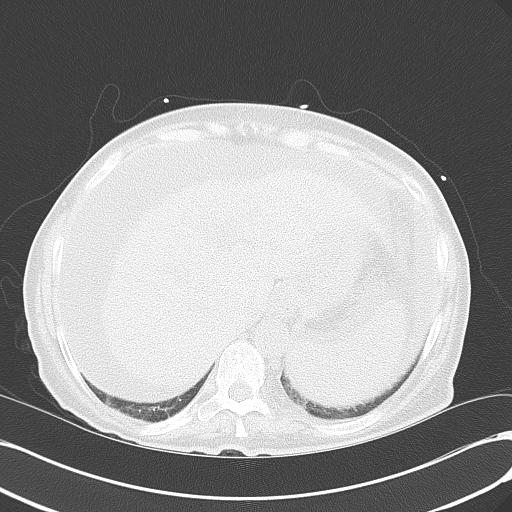
[im 15/29  soft-tissue]
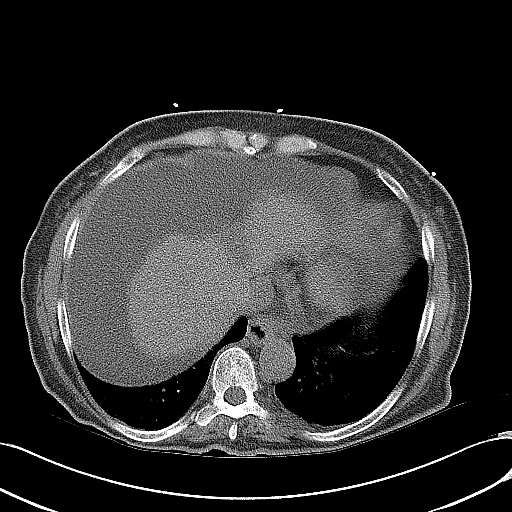
[im 15/29  lung]
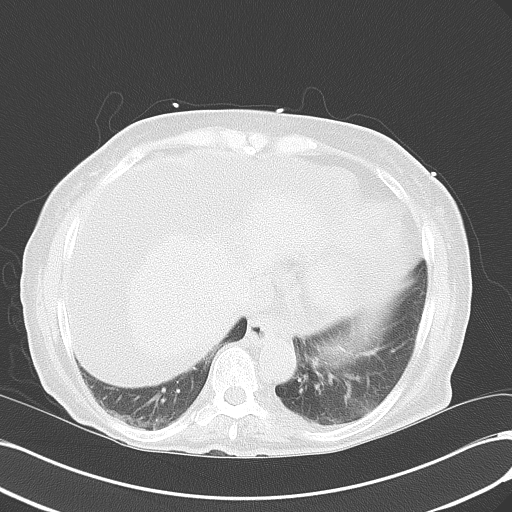
[im 19/29  soft-tissue]
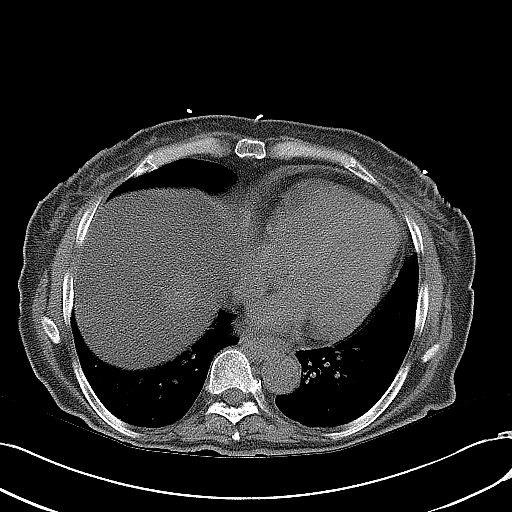
[im 19/29  lung]
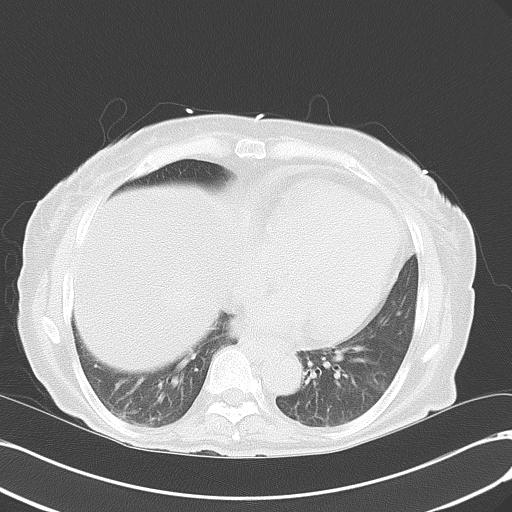
[im 24/29  soft-tissue]
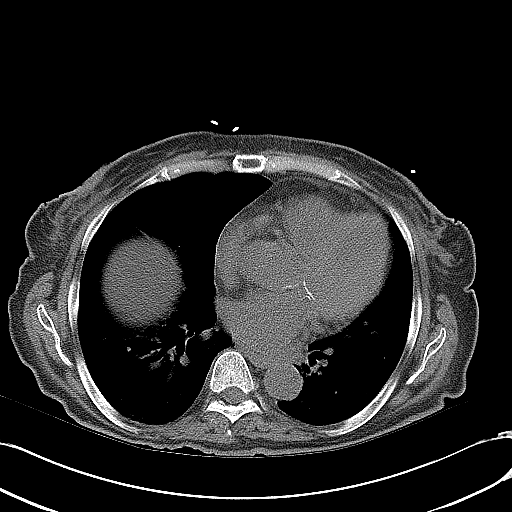
[im 24/29  lung]
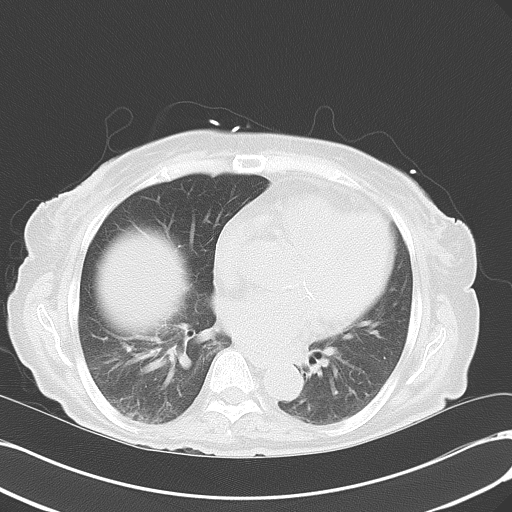

[Series 5: coronal · coronal · 0.75mm/px · 3 of 122 slices shown]
[im 41/122  soft-tissue]
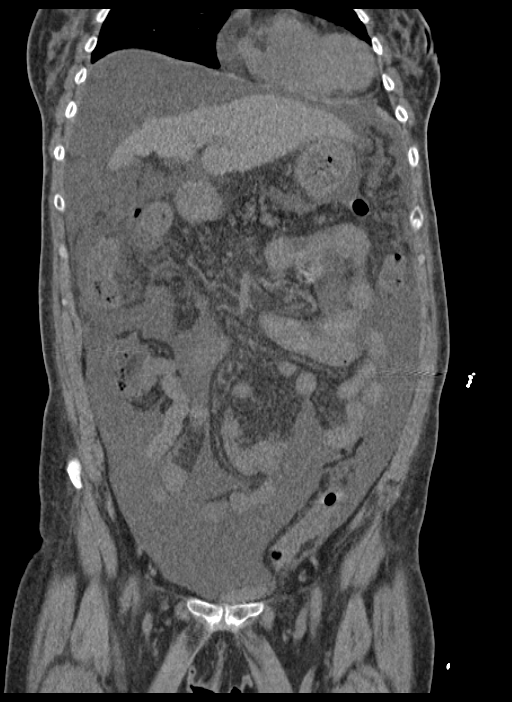
[im 54/122  soft-tissue]
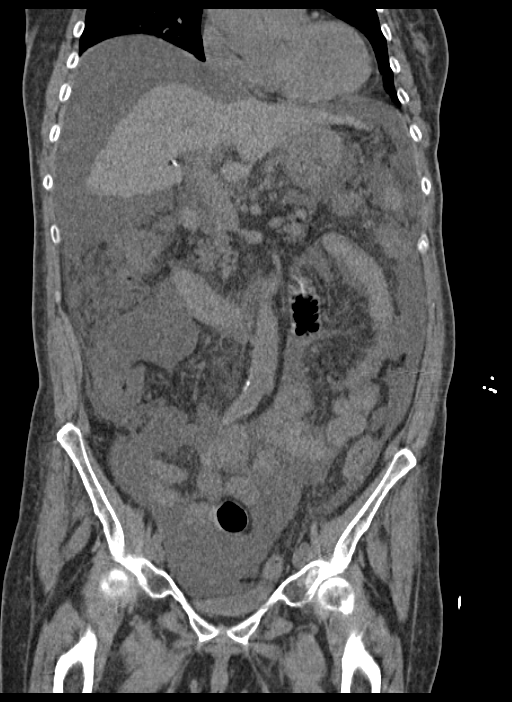
[im 68/122  soft-tissue]
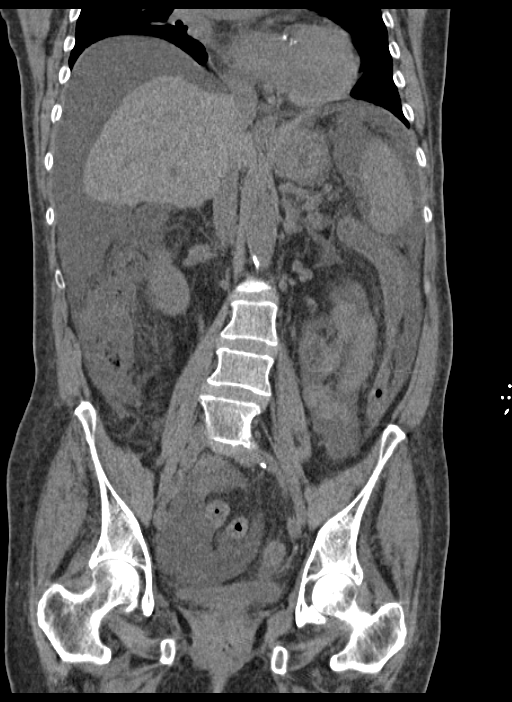

[Series 6: sagittal · sagittal · 0.54mm/px · 1 of 159 slices shown]
[im 53/159  soft-tissue]
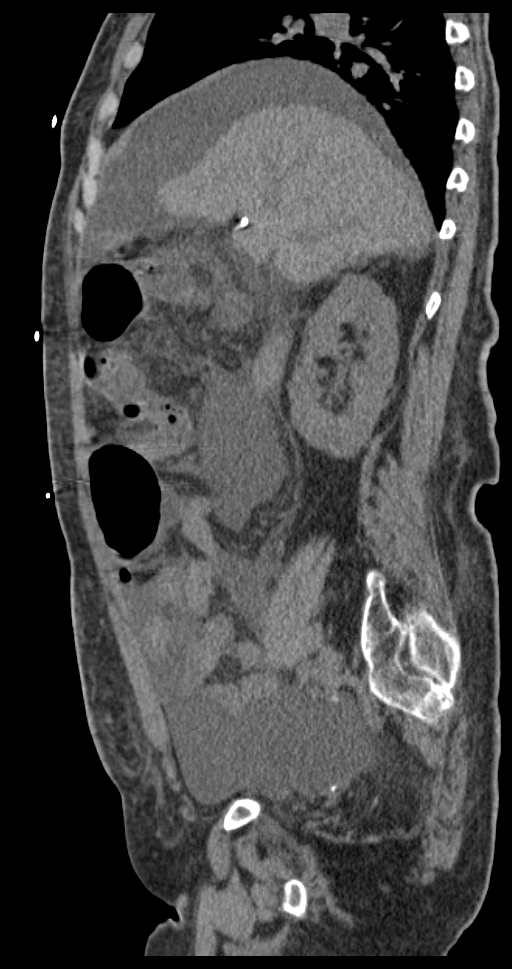

[9 of 46 positions shown; findings below may reference images not displayed]

FINDINGS: Lower chest: No acute findings. Minor basilar atelectasis. Mild
cardiac enlargement. No pericardial or pleural effusion.

Hepatobiliary: Hepatic cirrhosis again evident. Moderate diffuse
abdominal ascites and mesenteric congestion. Degree of ascites is
similar to the prior study. Prior cholecystectomy noted. No biliary
dilatation or obstruction appreciated.

Pancreas: No mass or inflammatory process identified on this
un-enhanced exam.

Spleen: Within normal limits in size.

Adrenals/Urinary Tract: Normal adrenal glands for noncontrast
imaging. Right kidney demonstrates a punctate nonobstructing lower
pole 9 mm calculus, image 43. Tiny midpole left intrarenal calculus
also evident. No renal obstruction or hydronephrosis. No ureteral
dilatation or obstructing ureteral calculus. Urinary bladder is
collapsed.

Stomach/Bowel: Negative for bowel obstruction, significant
dilatation, ileus, or free air. Exam of the bowel is limited without
oral contrast.

Vascular/Lymphatic: Negative for adenopathy. Abdominal
atherosclerosis noted. No significant aneurysm.

Reproductive: Previous hysterectomy.

Other: No inguinal abnormality or hernia. Improvement in the diffuse
body wall anasarca compared to 3 days ago.

Musculoskeletal: Bones are osteopenic. Degenerative changes of the
spine noted with a mild levoscoliosis. No acute osseous finding.
IMPRESSION: Hepatic cirrhosis and moderate diffuse abdominal and pelvic ascites.
No significant interval change.

Nonobstructing nephrolithiasis.

Improvement in body anasarca.

## 2018-01-03 LAB — CULTURE, BLOOD (ROUTINE X 2)
CULTURE: NO GROWTH
Culture: NO GROWTH
Special Requests: ADEQUATE

## 2018-01-04 IMAGING — US US ABDOMEN COMPLETE
1 series · 14 of 25 positions shown · non-contrast
Comparison: CT abdomen and pelvis 08/14/2015

CLINICAL DATA: Abdominal pain.  Nausea.  Symptoms for 3 days.

EXAM:
ABDOMEN ULTRASOUND COMPLETE

[Series 1: us abdomen complete · 0.23mm/px · 14 of 59 slices shown]
[im 1/59]
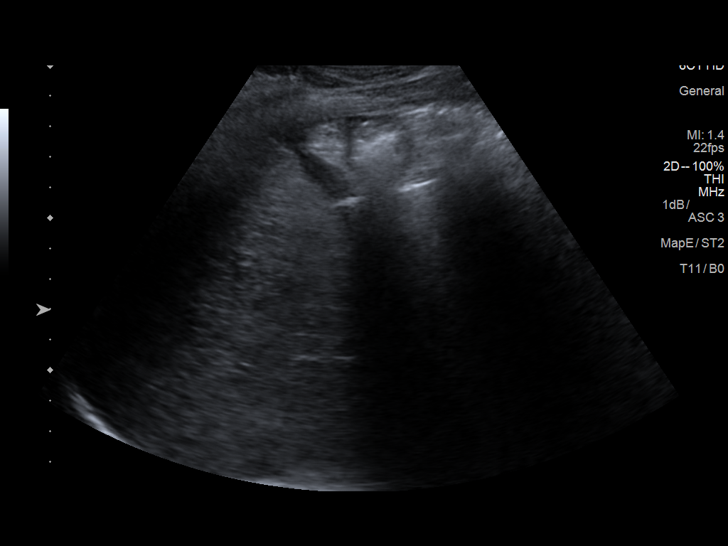
[im 5/59]
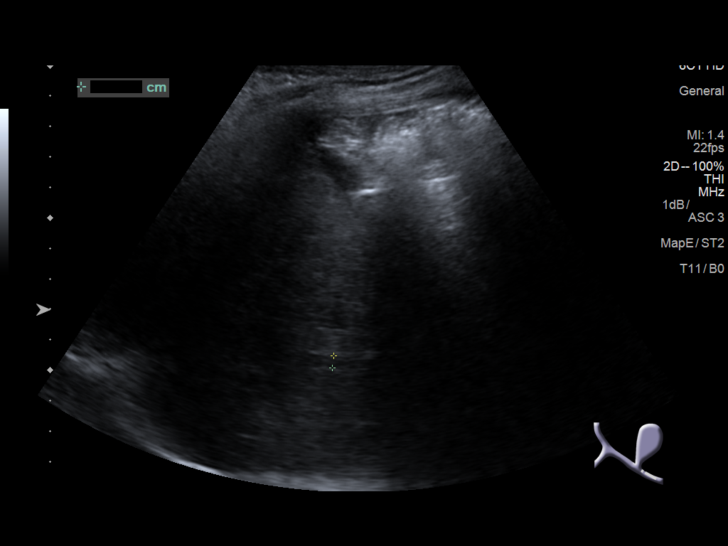
[im 10/59]
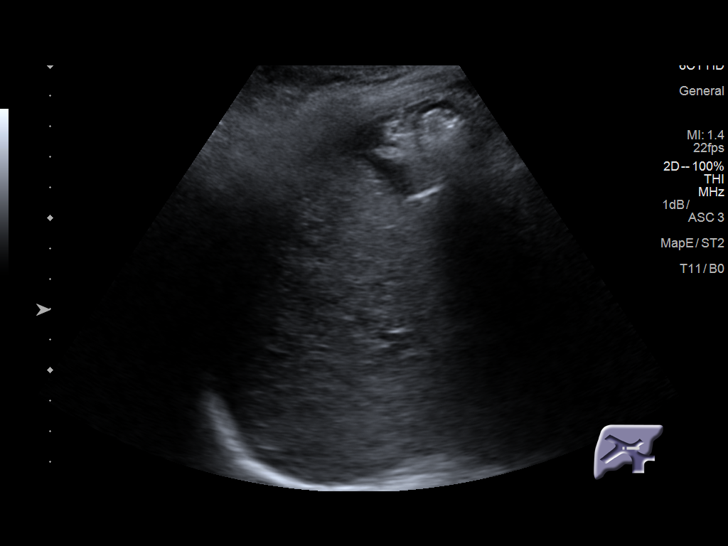
[im 15/59]
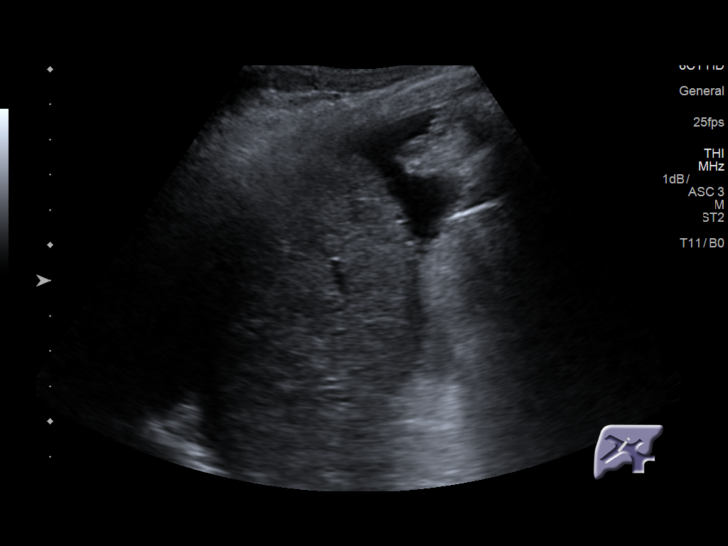
[im 20/59]
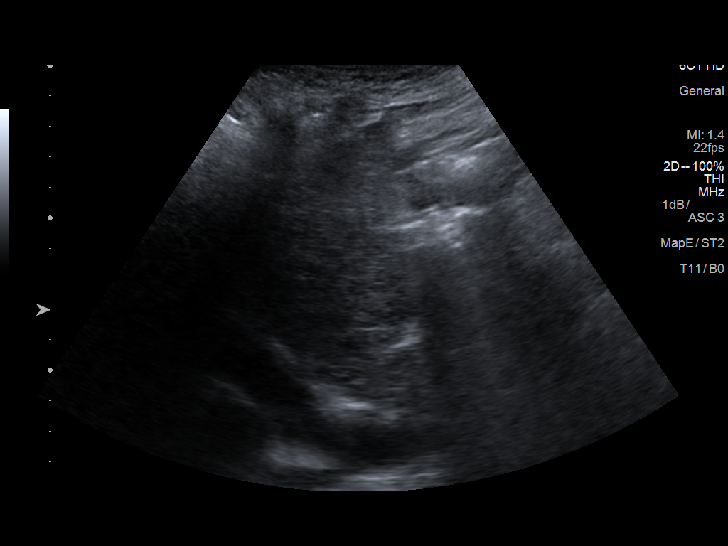
[im 22/59]
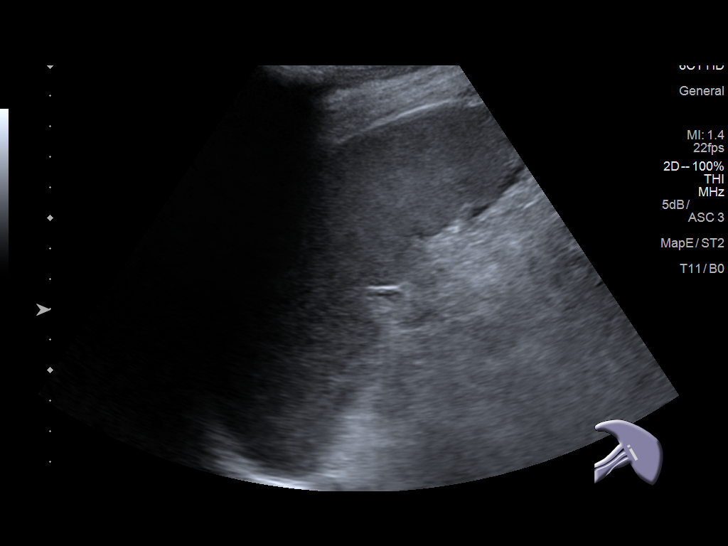
[im 27/59]
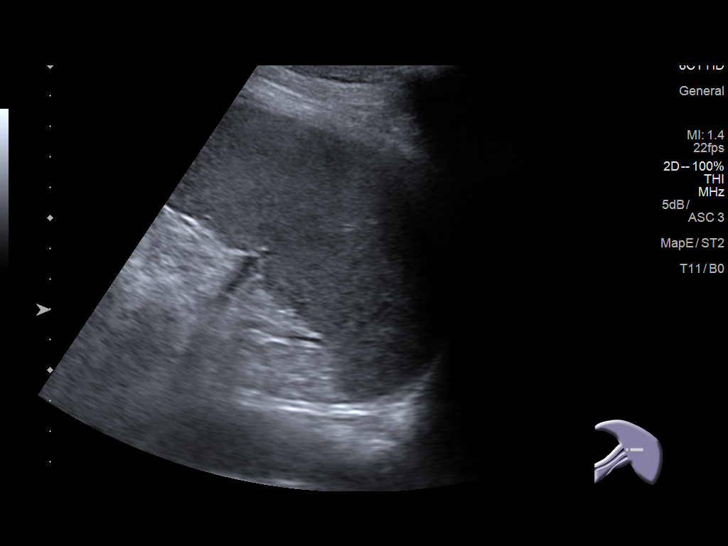
[im 32/59]
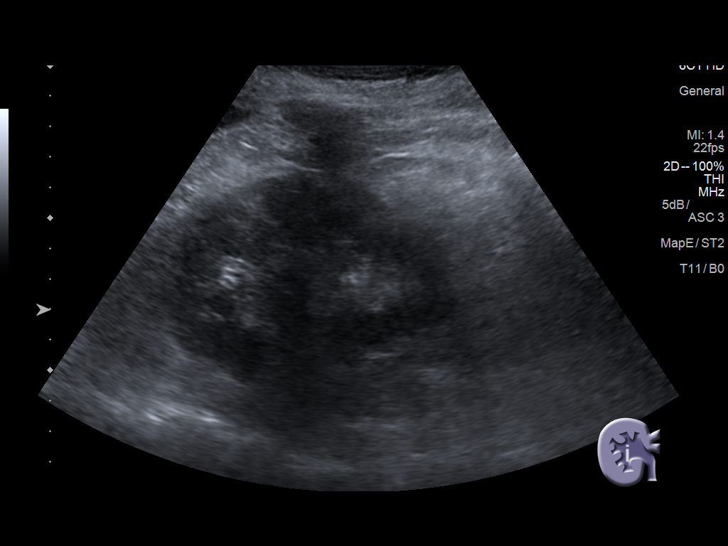
[im 37/59]
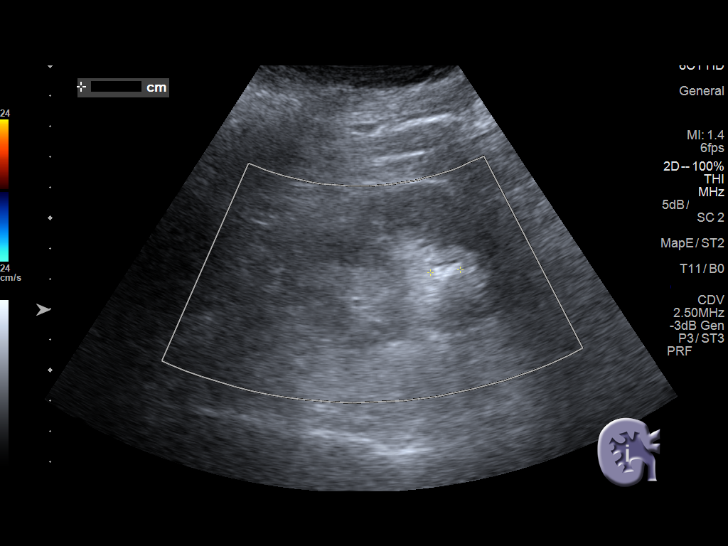
[im 39/59]
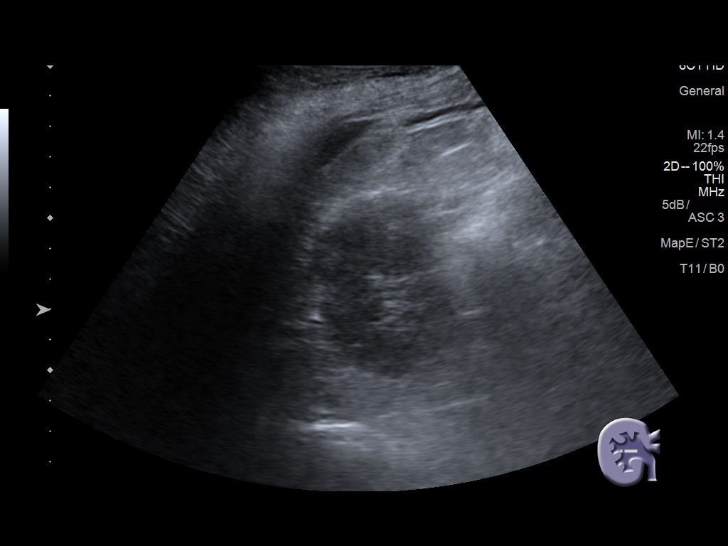
[im 44/59]
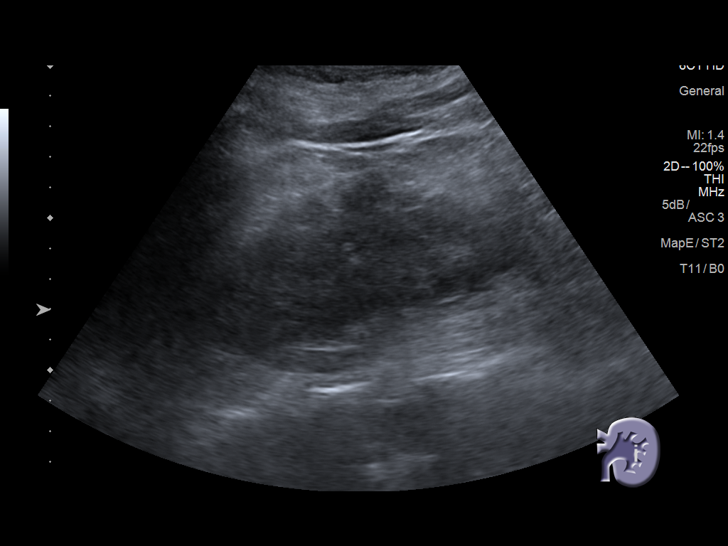
[im 49/59]
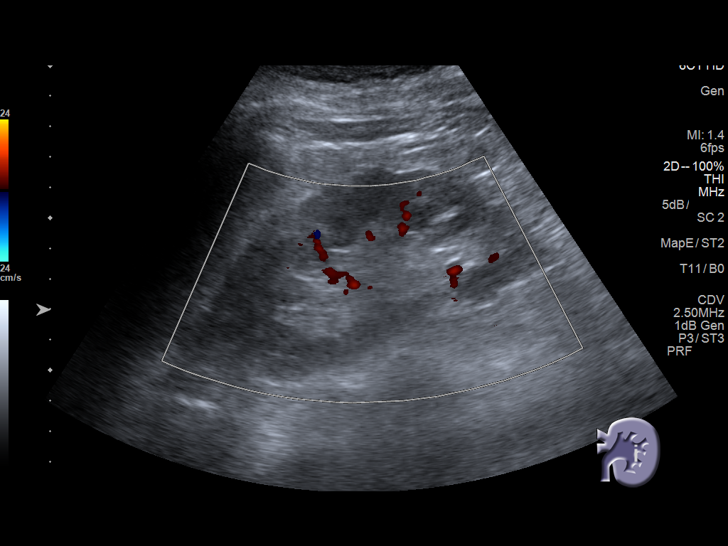
[im 54/59]
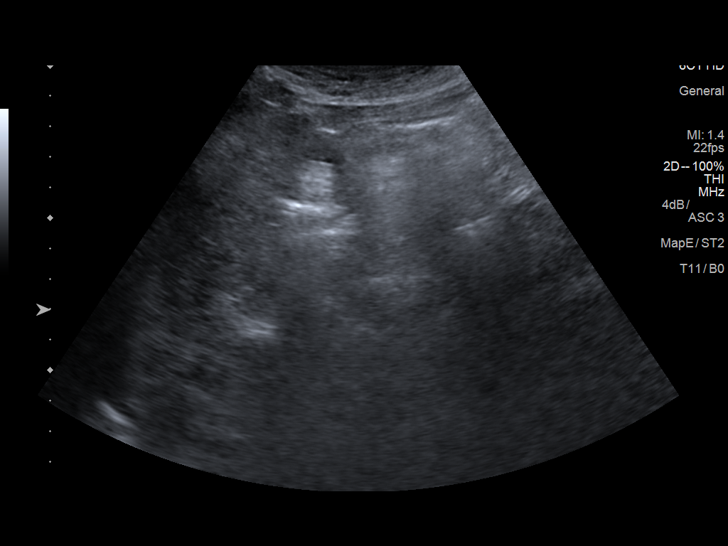
[im 59/59]
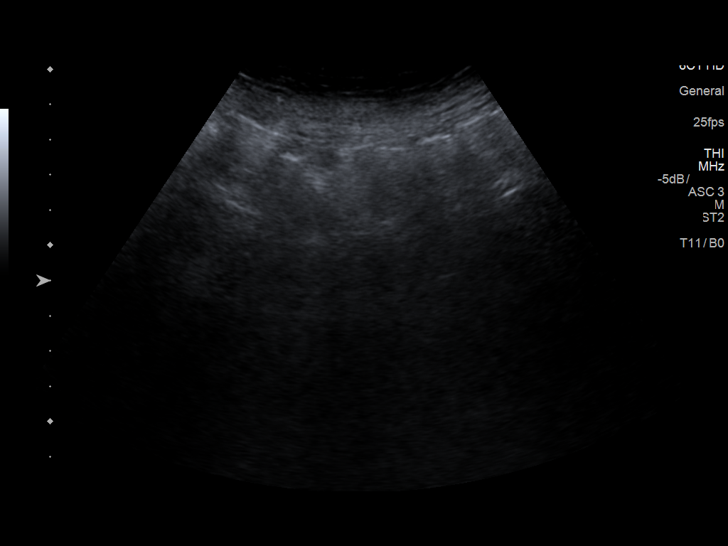

[14 of 25 positions shown; findings below may reference images not displayed]

FINDINGS: Gallbladder: Gallbladder is surgically absent.

Common bile duct: Diameter: 4 mm, normal

Liver: Diffuse coarsening of hepatic parenchymal echotexture with
nodular contour consistent with cirrhosis. No focal lesions
identified. Small amount of free fluid around the liver is likely
ascites. No Doppler images were obtained of the portal veins.

IVC: No abnormality visualized.

Pancreas: Not visualized due to overlying bowel gas.

Spleen: Size and appearance within normal limits.

Right Kidney: Length: 10 cm. Stone in the lower pole measuring 1 cm
diameter. No hydronephrosis. Normal parenchymal echotexture.

Left Kidney: Length: 11.2 cm. Echogenicity within normal limits. No
mass or hydronephrosis visualized.

Abdominal aorta: No aneurysm visualized.

Other findings: None.
IMPRESSION: Surgical absence of the gallbladder. No bile duct dilatation.
Changes of hepatic cirrhosis with free fluid consistent with
ascites. Nonobstructing right renal stone.

## 2018-01-08 ENCOUNTER — Ambulatory Visit: Payer: Medicare HMO | Admitting: Gastroenterology

## 2018-01-08 ENCOUNTER — Encounter: Payer: Self-pay | Admitting: Gastroenterology

## 2018-01-20 DIAGNOSIS — I10 Essential (primary) hypertension: Secondary | ICD-10-CM | POA: Diagnosis not present

## 2018-01-20 DIAGNOSIS — M5412 Radiculopathy, cervical region: Secondary | ICD-10-CM | POA: Diagnosis not present

## 2018-01-20 DIAGNOSIS — M5416 Radiculopathy, lumbar region: Secondary | ICD-10-CM | POA: Diagnosis not present

## 2018-01-20 DIAGNOSIS — Z79899 Other long term (current) drug therapy: Secondary | ICD-10-CM | POA: Diagnosis not present

## 2018-01-20 DIAGNOSIS — G894 Chronic pain syndrome: Secondary | ICD-10-CM | POA: Diagnosis not present

## 2018-01-20 DIAGNOSIS — M542 Cervicalgia: Secondary | ICD-10-CM | POA: Diagnosis not present

## 2018-01-20 DIAGNOSIS — M545 Low back pain: Secondary | ICD-10-CM | POA: Diagnosis not present

## 2018-01-20 DIAGNOSIS — F1120 Opioid dependence, uncomplicated: Secondary | ICD-10-CM | POA: Diagnosis not present

## 2018-02-10 DIAGNOSIS — G894 Chronic pain syndrome: Secondary | ICD-10-CM | POA: Diagnosis not present

## 2018-02-10 DIAGNOSIS — G8929 Other chronic pain: Secondary | ICD-10-CM | POA: Diagnosis not present

## 2018-02-10 DIAGNOSIS — M542 Cervicalgia: Secondary | ICD-10-CM | POA: Diagnosis not present

## 2018-02-10 DIAGNOSIS — F1120 Opioid dependence, uncomplicated: Secondary | ICD-10-CM | POA: Diagnosis not present

## 2018-02-10 DIAGNOSIS — M545 Low back pain: Secondary | ICD-10-CM | POA: Diagnosis not present

## 2018-02-10 DIAGNOSIS — I10 Essential (primary) hypertension: Secondary | ICD-10-CM | POA: Diagnosis not present

## 2018-02-10 DIAGNOSIS — M5412 Radiculopathy, cervical region: Secondary | ICD-10-CM | POA: Diagnosis not present

## 2018-02-10 DIAGNOSIS — M5416 Radiculopathy, lumbar region: Secondary | ICD-10-CM | POA: Diagnosis not present

## 2018-02-10 DIAGNOSIS — Z79899 Other long term (current) drug therapy: Secondary | ICD-10-CM | POA: Diagnosis not present

## 2018-03-17 DIAGNOSIS — Z79899 Other long term (current) drug therapy: Secondary | ICD-10-CM | POA: Diagnosis not present

## 2018-03-17 DIAGNOSIS — M545 Low back pain: Secondary | ICD-10-CM | POA: Diagnosis not present

## 2018-03-17 DIAGNOSIS — F1120 Opioid dependence, uncomplicated: Secondary | ICD-10-CM | POA: Diagnosis not present

## 2018-03-17 DIAGNOSIS — G894 Chronic pain syndrome: Secondary | ICD-10-CM | POA: Diagnosis not present

## 2018-03-17 DIAGNOSIS — I10 Essential (primary) hypertension: Secondary | ICD-10-CM | POA: Diagnosis not present

## 2018-03-17 DIAGNOSIS — M5412 Radiculopathy, cervical region: Secondary | ICD-10-CM | POA: Diagnosis not present

## 2018-03-17 DIAGNOSIS — M542 Cervicalgia: Secondary | ICD-10-CM | POA: Diagnosis not present

## 2018-03-17 DIAGNOSIS — M5416 Radiculopathy, lumbar region: Secondary | ICD-10-CM | POA: Diagnosis not present

## 2018-04-04 IMAGING — CR DG ABDOMEN 2V
3 series · 3 of 3 positions shown · non-contrast
Comparison: CT of the abdomen and pelvis from 03/13/2016

CLINICAL DATA: Acute onset of nausea and vomiting. Upper abdominal
pain. Initial encounter.

EXAM:
ABDOMEN - 2 VIEW

[abdomen erect]
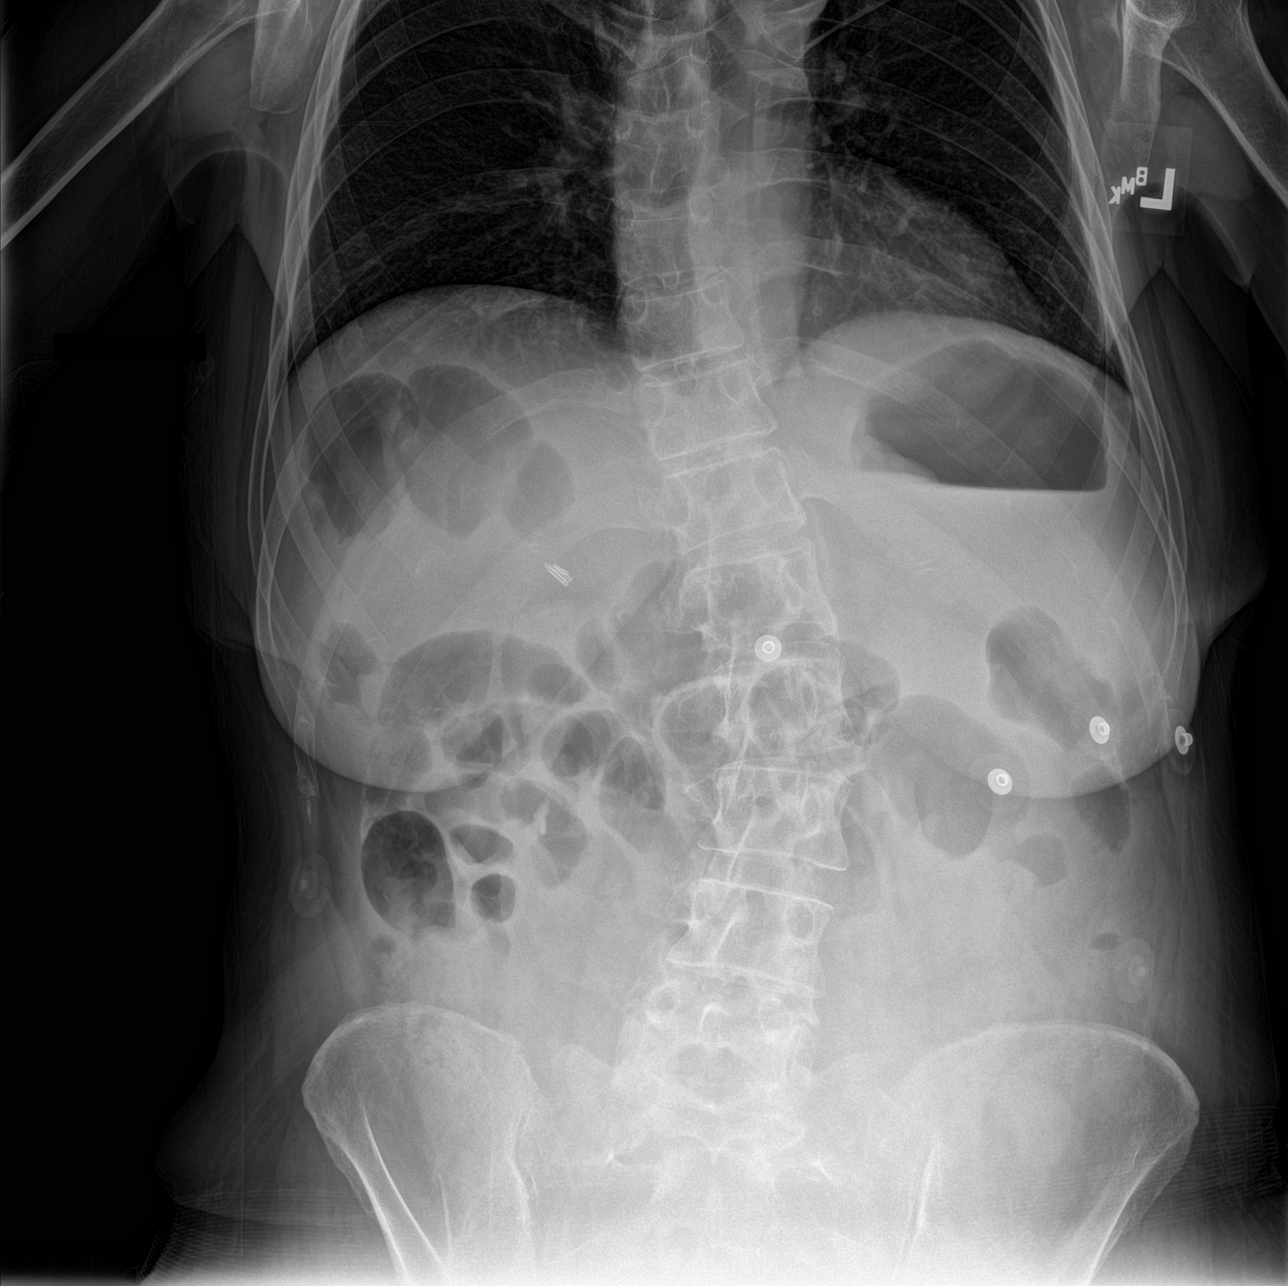

[abdomen supine (1 of 2)]
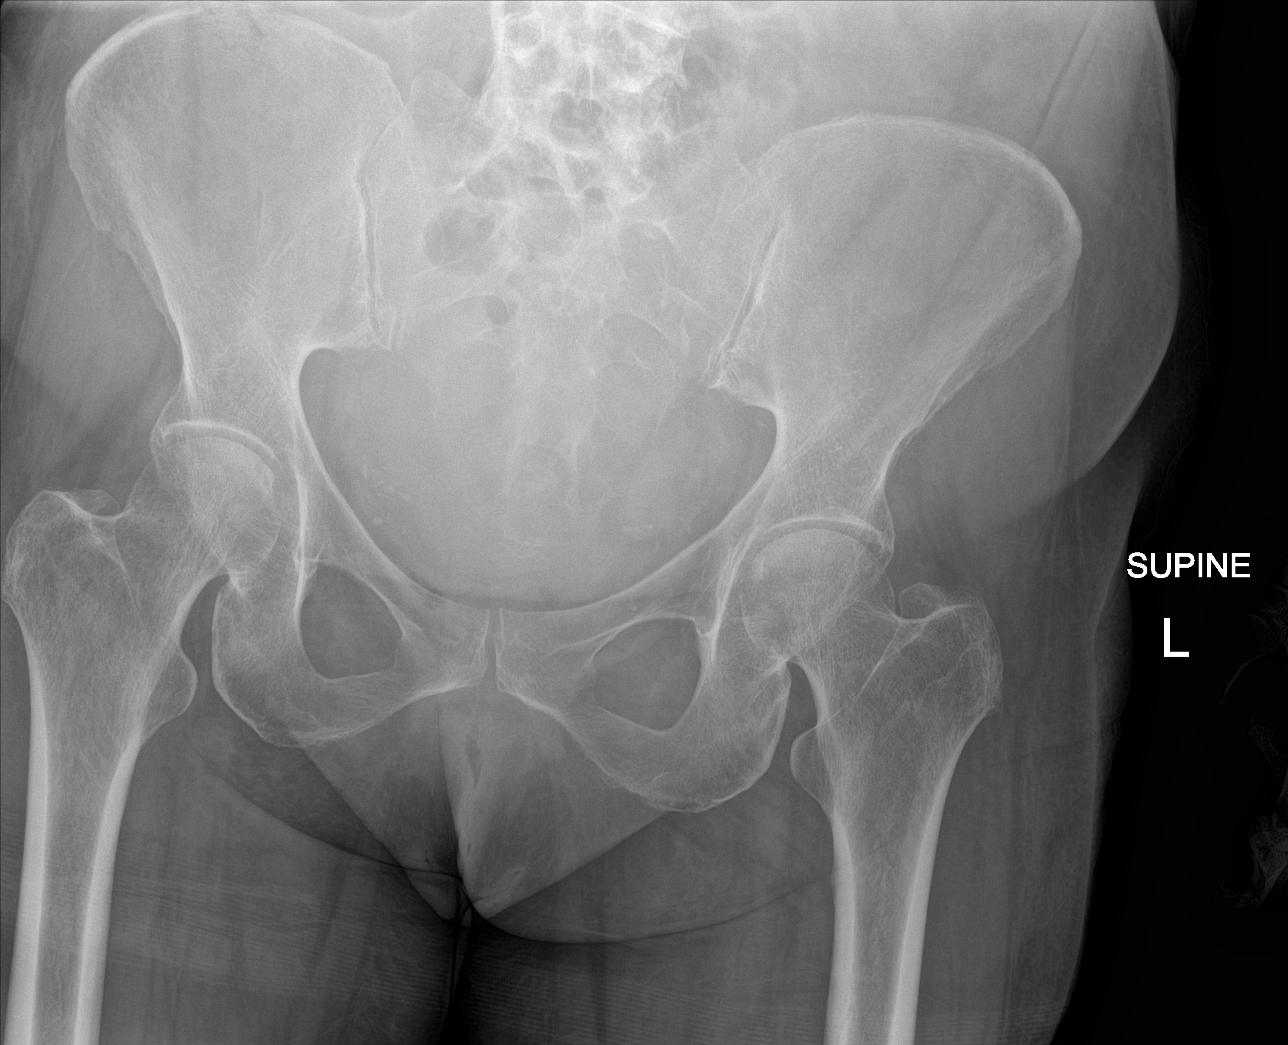

[abdomen supine (2 of 2)]
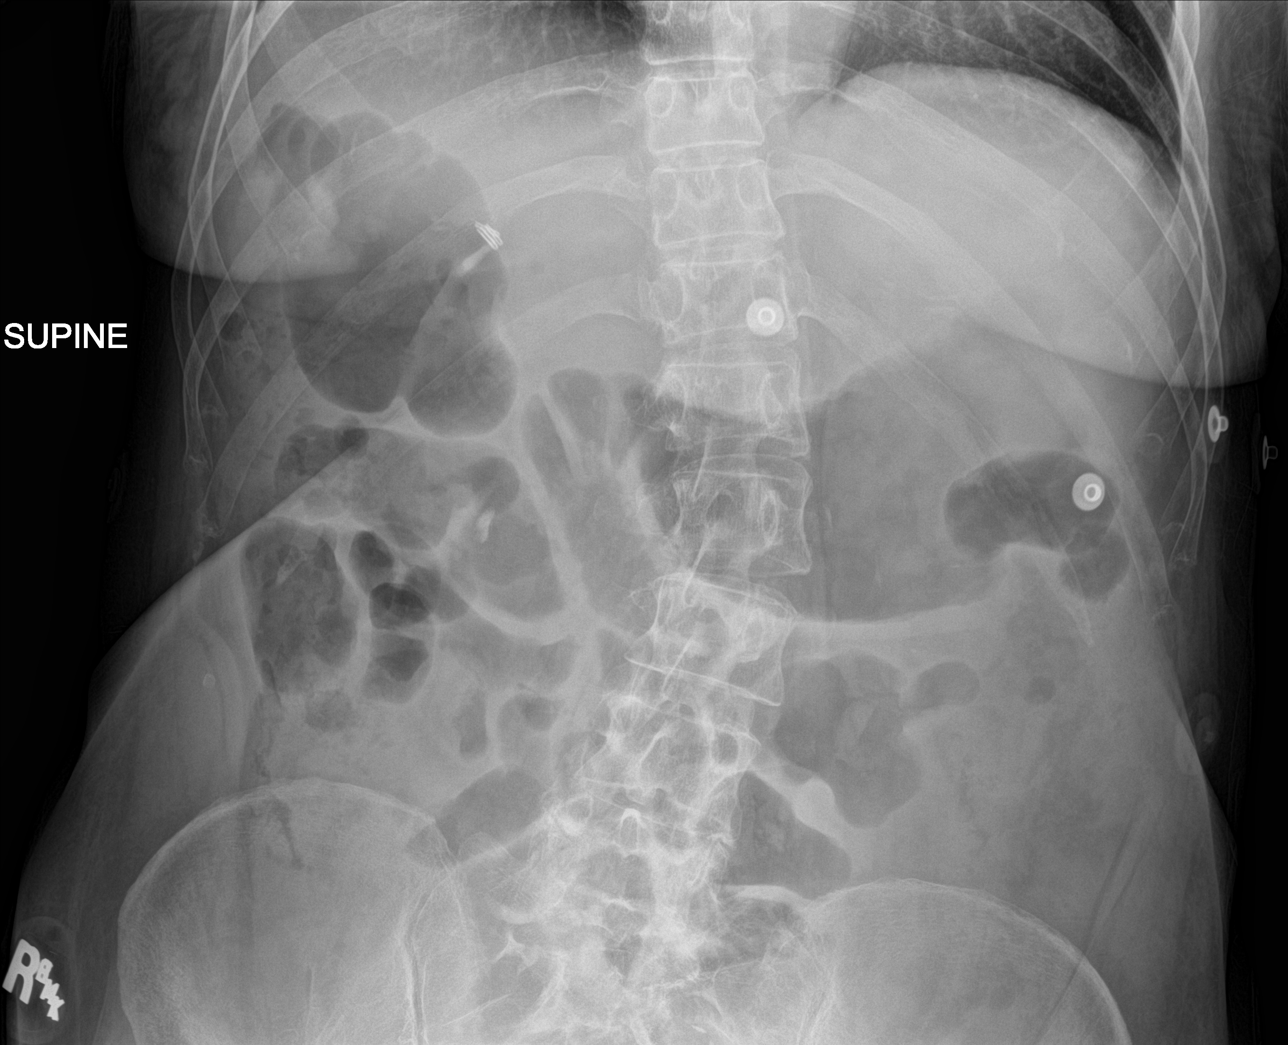

[3 of 3 positions shown; findings below may reference images not displayed]

FINDINGS: The visualized bowel gas pattern is unremarkable. Scattered air and
stool filled loops of colon are seen; no abnormal dilatation of
small bowel loops is seen to suggest small bowel obstruction. No
free intra-abdominal air is identified on the provided upright view.
Clips are noted within the right upper quadrant, reflecting prior
cholecystectomy.

The visualized osseous structures are within normal limits; the
sacroiliac joints are unremarkable in appearance. The visualized
lung bases are essentially clear.
IMPRESSION: Unremarkable bowel gas pattern; no free intra-abdominal air seen.

## 2018-04-14 DIAGNOSIS — M545 Low back pain: Secondary | ICD-10-CM | POA: Diagnosis not present

## 2018-04-14 DIAGNOSIS — M5412 Radiculopathy, cervical region: Secondary | ICD-10-CM | POA: Diagnosis not present

## 2018-04-14 DIAGNOSIS — G894 Chronic pain syndrome: Secondary | ICD-10-CM | POA: Diagnosis not present

## 2018-04-14 DIAGNOSIS — Z79899 Other long term (current) drug therapy: Secondary | ICD-10-CM | POA: Diagnosis not present

## 2018-04-14 DIAGNOSIS — M542 Cervicalgia: Secondary | ICD-10-CM | POA: Diagnosis not present

## 2018-04-14 DIAGNOSIS — I10 Essential (primary) hypertension: Secondary | ICD-10-CM | POA: Diagnosis not present

## 2018-04-14 DIAGNOSIS — F1120 Opioid dependence, uncomplicated: Secondary | ICD-10-CM | POA: Diagnosis not present

## 2018-04-14 DIAGNOSIS — M5416 Radiculopathy, lumbar region: Secondary | ICD-10-CM | POA: Diagnosis not present

## 2018-05-19 DIAGNOSIS — F1120 Opioid dependence, uncomplicated: Secondary | ICD-10-CM | POA: Diagnosis not present

## 2018-05-19 DIAGNOSIS — Z79899 Other long term (current) drug therapy: Secondary | ICD-10-CM | POA: Diagnosis not present

## 2018-05-19 DIAGNOSIS — M542 Cervicalgia: Secondary | ICD-10-CM | POA: Diagnosis not present

## 2018-05-19 DIAGNOSIS — M5416 Radiculopathy, lumbar region: Secondary | ICD-10-CM | POA: Diagnosis not present

## 2018-05-19 DIAGNOSIS — I10 Essential (primary) hypertension: Secondary | ICD-10-CM | POA: Diagnosis not present

## 2018-05-19 DIAGNOSIS — M545 Low back pain: Secondary | ICD-10-CM | POA: Diagnosis not present

## 2018-05-19 DIAGNOSIS — G894 Chronic pain syndrome: Secondary | ICD-10-CM | POA: Diagnosis not present

## 2018-05-19 DIAGNOSIS — M5412 Radiculopathy, cervical region: Secondary | ICD-10-CM | POA: Diagnosis not present

## 2018-05-22 ENCOUNTER — Emergency Department (HOSPITAL_COMMUNITY): Payer: Medicare HMO

## 2018-05-22 ENCOUNTER — Other Ambulatory Visit: Payer: Self-pay | Admitting: Emergency Medicine

## 2018-05-22 ENCOUNTER — Inpatient Hospital Stay (HOSPITAL_COMMUNITY)
Admission: EM | Admit: 2018-05-22 | Discharge: 2018-05-24 | DRG: 637 | Disposition: A | Discharge status: Home Health | Payer: Medicare HMO | Attending: Internal Medicine | Admitting: Internal Medicine

## 2018-05-22 DIAGNOSIS — Z79891 Long term (current) use of opiate analgesic: Secondary | ICD-10-CM

## 2018-05-22 DIAGNOSIS — R1013 Epigastric pain: Secondary | ICD-10-CM | POA: Diagnosis not present

## 2018-05-22 DIAGNOSIS — Z7901 Long term (current) use of anticoagulants: Secondary | ICD-10-CM

## 2018-05-22 DIAGNOSIS — Z79899 Other long term (current) drug therapy: Secondary | ICD-10-CM | POA: Diagnosis not present

## 2018-05-22 DIAGNOSIS — Z7951 Long term (current) use of inhaled steroids: Secondary | ICD-10-CM

## 2018-05-22 DIAGNOSIS — N189 Chronic kidney disease, unspecified: Secondary | ICD-10-CM | POA: Diagnosis not present

## 2018-05-22 DIAGNOSIS — Z887 Allergy status to serum and vaccine status: Secondary | ICD-10-CM

## 2018-05-22 DIAGNOSIS — G9340 Encephalopathy, unspecified: Secondary | ICD-10-CM

## 2018-05-22 DIAGNOSIS — Z888 Allergy status to other drugs, medicaments and biological substances status: Secondary | ICD-10-CM

## 2018-05-22 DIAGNOSIS — R531 Weakness: Secondary | ICD-10-CM | POA: Diagnosis not present

## 2018-05-22 DIAGNOSIS — J449 Chronic obstructive pulmonary disease, unspecified: Secondary | ICD-10-CM | POA: Diagnosis present

## 2018-05-22 DIAGNOSIS — E1110 Type 2 diabetes mellitus with ketoacidosis without coma: Principal | ICD-10-CM

## 2018-05-22 DIAGNOSIS — K7210 Chronic hepatic failure without coma: Secondary | ICD-10-CM | POA: Diagnosis present

## 2018-05-22 DIAGNOSIS — I482 Chronic atrial fibrillation: Secondary | ICD-10-CM | POA: Diagnosis present

## 2018-05-22 DIAGNOSIS — E1122 Type 2 diabetes mellitus with diabetic chronic kidney disease: Secondary | ICD-10-CM | POA: Diagnosis present

## 2018-05-22 DIAGNOSIS — E860 Dehydration: Secondary | ICD-10-CM | POA: Diagnosis present

## 2018-05-22 DIAGNOSIS — Z881 Allergy status to other antibiotic agents status: Secondary | ICD-10-CM

## 2018-05-22 DIAGNOSIS — G9341 Metabolic encephalopathy: Secondary | ICD-10-CM | POA: Diagnosis not present

## 2018-05-22 DIAGNOSIS — R3129 Other microscopic hematuria: Secondary | ICD-10-CM | POA: Diagnosis present

## 2018-05-22 DIAGNOSIS — Z8711 Personal history of peptic ulcer disease: Secondary | ICD-10-CM

## 2018-05-22 DIAGNOSIS — Z8719 Personal history of other diseases of the digestive system: Secondary | ICD-10-CM

## 2018-05-22 DIAGNOSIS — R4182 Altered mental status, unspecified: Secondary | ICD-10-CM | POA: Diagnosis not present

## 2018-05-22 DIAGNOSIS — R410 Disorientation, unspecified: Secondary | ICD-10-CM | POA: Diagnosis not present

## 2018-05-22 DIAGNOSIS — R402363 Coma scale, best motor response, obeys commands, at hospital admission: Secondary | ICD-10-CM | POA: Diagnosis present

## 2018-05-22 DIAGNOSIS — E1165 Type 2 diabetes mellitus with hyperglycemia: Secondary | ICD-10-CM | POA: Diagnosis not present

## 2018-05-22 DIAGNOSIS — I428 Other cardiomyopathies: Secondary | ICD-10-CM | POA: Diagnosis not present

## 2018-05-22 DIAGNOSIS — Z794 Long term (current) use of insulin: Secondary | ICD-10-CM

## 2018-05-22 DIAGNOSIS — E032 Hypothyroidism due to medicaments and other exogenous substances: Secondary | ICD-10-CM | POA: Diagnosis present

## 2018-05-22 DIAGNOSIS — I69392 Facial weakness following cerebral infarction: Secondary | ICD-10-CM

## 2018-05-22 DIAGNOSIS — I129 Hypertensive chronic kidney disease with stage 1 through stage 4 chronic kidney disease, or unspecified chronic kidney disease: Secondary | ICD-10-CM | POA: Diagnosis present

## 2018-05-22 DIAGNOSIS — B3749 Other urogenital candidiasis: Secondary | ICD-10-CM | POA: Diagnosis not present

## 2018-05-22 DIAGNOSIS — R402143 Coma scale, eyes open, spontaneous, at hospital admission: Secondary | ICD-10-CM | POA: Diagnosis present

## 2018-05-22 DIAGNOSIS — G894 Chronic pain syndrome: Secondary | ICD-10-CM | POA: Diagnosis present

## 2018-05-22 DIAGNOSIS — R4020 Unspecified coma: Secondary | ICD-10-CM | POA: Diagnosis not present

## 2018-05-22 DIAGNOSIS — K7469 Other cirrhosis of liver: Secondary | ICD-10-CM | POA: Diagnosis present

## 2018-05-22 DIAGNOSIS — Z8679 Personal history of other diseases of the circulatory system: Secondary | ICD-10-CM

## 2018-05-22 DIAGNOSIS — I4891 Unspecified atrial fibrillation: Secondary | ICD-10-CM | POA: Diagnosis not present

## 2018-05-22 DIAGNOSIS — Z87891 Personal history of nicotine dependence: Secondary | ICD-10-CM | POA: Diagnosis not present

## 2018-05-22 DIAGNOSIS — R404 Transient alteration of awareness: Secondary | ICD-10-CM | POA: Diagnosis not present

## 2018-05-22 DIAGNOSIS — Z886 Allergy status to analgesic agent status: Secondary | ICD-10-CM

## 2018-05-22 DIAGNOSIS — R000 Tachycardia, unspecified: Secondary | ICD-10-CM | POA: Diagnosis not present

## 2018-05-22 DIAGNOSIS — N390 Urinary tract infection, site not specified: Secondary | ICD-10-CM

## 2018-05-22 DIAGNOSIS — N179 Acute kidney failure, unspecified: Secondary | ICD-10-CM | POA: Diagnosis present

## 2018-05-22 DIAGNOSIS — E875 Hyperkalemia: Secondary | ICD-10-CM | POA: Diagnosis not present

## 2018-05-22 DIAGNOSIS — R402243 Coma scale, best verbal response, confused conversation, at hospital admission: Secondary | ICD-10-CM | POA: Diagnosis present

## 2018-05-22 DIAGNOSIS — Z885 Allergy status to narcotic agent status: Secondary | ICD-10-CM

## 2018-05-22 DIAGNOSIS — I447 Left bundle-branch block, unspecified: Secondary | ICD-10-CM | POA: Diagnosis not present

## 2018-05-22 DIAGNOSIS — I4892 Unspecified atrial flutter: Secondary | ICD-10-CM | POA: Diagnosis not present

## 2018-05-22 HISTORY — DX: Encephalopathy, unspecified: G93.40

## 2018-05-22 HISTORY — DX: Urinary tract infection, site not specified: N39.0

## 2018-05-22 LAB — CBG MONITORING, ED
Glucose-Capillary: 275 mg/dL — ABNORMAL HIGH (ref 70–99)
Glucose-Capillary: 271 mg/dL — ABNORMAL HIGH (ref 70–99)
Glucose-Capillary: 226 mg/dL — ABNORMAL HIGH (ref 70–99)
Glucose-Capillary: 238 mg/dL — ABNORMAL HIGH (ref 70–99)

## 2018-05-22 LAB — BASIC METABOLIC PANEL
Anion gap: 11 (ref 5–15)
Anion gap: 7 (ref 5–15)
Anion gap: 7 (ref 5–15)
BUN: 20 mg/dL (ref 8–23)
BUN: 15 mg/dL (ref 8–23)
BUN: 14 mg/dL (ref 8–23)
CALCIUM: 6.7 mg/dL — AB (ref 8.9–10.3)
CHLORIDE: 112 mmol/L — AB (ref 98–111)
CO2: 17 mmol/L — ABNORMAL LOW (ref 22–32)
CO2: 15 mmol/L — ABNORMAL LOW (ref 22–32)
CO2: 18 mmol/L — AB (ref 22–32)
CREATININE: 1.63 mg/dL — AB (ref 0.44–1.00)
CREATININE: 1.19 mg/dL — AB (ref 0.44–1.00)
CREATININE: 1.13 mg/dL — AB (ref 0.44–1.00)
Calcium: 7.6 mg/dL — ABNORMAL LOW (ref 8.9–10.3)
Calcium: 7 mg/dL — ABNORMAL LOW (ref 8.9–10.3)
Chloride: 109 mmol/L (ref 98–111)
Chloride: 98 mmol/L (ref 98–111)
GFR calc Af Amer: 37 mL/min — ABNORMAL LOW (ref >60)
GFR calc Af Amer: 58 mL/min — ABNORMAL LOW (ref >60)
GFR calc non Af Amer: 47 mL/min — ABNORMAL LOW (ref >60)
GFR calc non Af Amer: 50 mL/min — ABNORMAL LOW (ref >60)
GFR, EST AFRICAN AMERICAN: 55 mL/min — AB (ref >60)
GFR, EST NON AFRICAN AMERICAN: 32 mL/min — AB (ref >60)
GLUCOSE: 243 mg/dL — AB (ref 70–99)
Glucose, Bld: 154 mg/dL — ABNORMAL HIGH (ref 70–99)
Glucose, Bld: 124 mg/dL — ABNORMAL HIGH (ref 70–99)
POTASSIUM: 2.8 mmol/L — AB (ref 3.5–5.1)
Potassium: 3.6 mmol/L (ref 3.5–5.1)
Potassium: 6.7 mmol/L (ref 3.5–5.1)
SODIUM: 137 mmol/L (ref 135–145)
SODIUM: 120 mmol/L — AB (ref 135–145)
Sodium: 137 mmol/L (ref 135–145)

## 2018-05-22 LAB — CBC WITH DIFFERENTIAL/PLATELET
Abs Immature Granulocytes: 0 10*3/uL (ref 0.0–0.1)
Basophils Absolute: 0 10*3/uL (ref 0.0–0.1)
Basophils Relative: 0 %
EOS ABS: 0.1 10*3/uL (ref 0.0–0.7)
EOS PCT: 2 %
HEMATOCRIT: 31.1 % — AB (ref 36.0–46.0)
Hemoglobin: 9.5 g/dL — ABNORMAL LOW (ref 12.0–15.0)
Immature Granulocytes: 0 %
LYMPHS ABS: 1 10*3/uL (ref 0.7–4.0)
Lymphocytes Relative: 14 %
MCH: 27.9 pg (ref 26.0–34.0)
MCHC: 30.5 g/dL (ref 30.0–36.0)
MCV: 91.5 fL (ref 78.0–100.0)
MONO ABS: 0.4 10*3/uL (ref 0.1–1.0)
MONOS PCT: 6 %
Neutro Abs: 5.7 10*3/uL (ref 1.7–7.7)
Neutrophils Relative %: 78 %
Platelets: 168 10*3/uL (ref 150–400)
RBC: 3.4 MIL/uL — ABNORMAL LOW (ref 3.87–5.11)
RDW: 15.2 % (ref 11.5–15.5)
WBC: 7.3 10*3/uL (ref 4.0–10.5)

## 2018-05-22 LAB — I-STAT VENOUS BLOOD GAS, ED
Acid-base deficit: 5 mmol/L — ABNORMAL HIGH (ref 0.0–2.0)
Bicarbonate: 22.7 mmol/L (ref 20.0–28.0)
O2 Saturation: 51 %
PCO2 VEN: 49.6 mmHg (ref 44.0–60.0)
PH VEN: 7.269 (ref 7.250–7.430)
PO2 VEN: 31 mmHg — AB (ref 32.0–45.0)
TCO2: 24 mmol/L (ref 22–32)

## 2018-05-22 LAB — COMPREHENSIVE METABOLIC PANEL
ALT: 28 U/L (ref 0–44)
ALT: 29 U/L (ref 0–44)
ANION GAP: 14 (ref 5–15)
AST: 64 U/L — AB (ref 15–41)
AST: 55 U/L — ABNORMAL HIGH (ref 15–41)
Albumin: 2.7 g/dL — ABNORMAL LOW (ref 3.5–5.0)
Albumin: 2.8 g/dL — ABNORMAL LOW (ref 3.5–5.0)
Alkaline Phosphatase: 107 U/L (ref 38–126)
Alkaline Phosphatase: 109 U/L (ref 38–126)
Anion gap: 12 (ref 5–15)
BILIRUBIN TOTAL: 1.3 mg/dL — AB (ref 0.3–1.2)
BUN: 21 mg/dL (ref 8–23)
BUN: 21 mg/dL (ref 8–23)
CHLORIDE: 102 mmol/L (ref 98–111)
CO2: 18 mmol/L — ABNORMAL LOW (ref 22–32)
CO2: 20 mmol/L — AB (ref 22–32)
CREATININE: 1.9 mg/dL — AB (ref 0.44–1.00)
Calcium: 8.5 mg/dL — ABNORMAL LOW (ref 8.9–10.3)
Calcium: 8.7 mg/dL — ABNORMAL LOW (ref 8.9–10.3)
Chloride: 102 mmol/L (ref 98–111)
Creatinine, Ser: 2.18 mg/dL — ABNORMAL HIGH (ref 0.44–1.00)
GFR calc Af Amer: 26 mL/min — ABNORMAL LOW (ref >60)
GFR calc non Af Amer: 23 mL/min — ABNORMAL LOW (ref >60)
GFR calc non Af Amer: 27 mL/min — ABNORMAL LOW (ref >60)
GFR, EST AFRICAN AMERICAN: 31 mL/min — AB (ref >60)
Glucose, Bld: 349 mg/dL — ABNORMAL HIGH (ref 70–99)
Glucose, Bld: 309 mg/dL — ABNORMAL HIGH (ref 70–99)
POTASSIUM: 3.2 mmol/L — AB (ref 3.5–5.1)
POTASSIUM: 3.3 mmol/L — AB (ref 3.5–5.1)
Sodium: 134 mmol/L — ABNORMAL LOW (ref 135–145)
Sodium: 134 mmol/L — ABNORMAL LOW (ref 135–145)
Total Bilirubin: 0.9 mg/dL (ref 0.3–1.2)
Total Protein: 6.4 g/dL — ABNORMAL LOW (ref 6.5–8.1)
Total Protein: 7 g/dL (ref 6.5–8.1)

## 2018-05-22 LAB — I-STAT CHEM 8, ED
BUN: 22 mg/dL (ref 8–23)
CALCIUM ION: 1.17 mmol/L (ref 1.15–1.40)
CHLORIDE: 100 mmol/L (ref 98–111)
Creatinine, Ser: 2.1 mg/dL — ABNORMAL HIGH (ref 0.44–1.00)
Glucose, Bld: 348 mg/dL — ABNORMAL HIGH (ref 70–99)
HCT: 30 % — ABNORMAL LOW (ref 36.0–46.0)
HEMOGLOBIN: 10.2 g/dL — AB (ref 12.0–15.0)
Potassium: 3.2 mmol/L — ABNORMAL LOW (ref 3.5–5.1)
SODIUM: 135 mmol/L (ref 135–145)
TCO2: 19 mmol/L — ABNORMAL LOW (ref 22–32)

## 2018-05-22 LAB — URINALYSIS, ROUTINE W REFLEX MICROSCOPIC
BILIRUBIN URINE: NEGATIVE
Glucose, UA: >=500 mg/dL — AB
KETONES UR: 5 mg/dL — AB
NITRITE: NEGATIVE
PH: 5 (ref 5.0–8.0)
Protein, ur: 100 mg/dL — AB
Specific Gravity, Urine: 1.017 (ref 1.005–1.030)
WBC, UA: >50 WBC/hpf — ABNORMAL HIGH (ref 0–5)

## 2018-05-22 LAB — GLUCOSE, CAPILLARY
GLUCOSE-CAPILLARY: 153 mg/dL — AB (ref 70–99)
Glucose-Capillary: 315 mg/dL — ABNORMAL HIGH (ref 70–99)
Glucose-Capillary: 206 mg/dL — ABNORMAL HIGH (ref 70–99)
Glucose-Capillary: 160 mg/dL — ABNORMAL HIGH (ref 70–99)
Glucose-Capillary: 126 mg/dL — ABNORMAL HIGH (ref 70–99)

## 2018-05-22 LAB — I-STAT CG4 LACTIC ACID, ED: LACTIC ACID, VENOUS: 0.94 mmol/L (ref 0.5–1.9)

## 2018-05-22 LAB — PROTIME-INR
INR: 1.31
Prothrombin Time: 16.1 seconds — ABNORMAL HIGH (ref 11.4–15.2)

## 2018-05-22 LAB — RAPID URINE DRUG SCREEN, HOSP PERFORMED
Amphetamines: NOT DETECTED
BARBITURATES: NOT DETECTED
Benzodiazepines: NOT DETECTED
COCAINE: NOT DETECTED
Opiates: POSITIVE — AB
Tetrahydrocannabinol: NOT DETECTED

## 2018-05-22 LAB — TSH: TSH: 3.389 u[IU]/mL (ref 0.350–4.500)

## 2018-05-22 LAB — BETA-HYDROXYBUTYRIC ACID: BETA-HYDROXYBUTYRIC ACID: 2.59 mmol/L — AB (ref 0.05–0.27)

## 2018-05-22 LAB — MAGNESIUM: Magnesium: 1.8 mg/dL (ref 1.7–2.4)

## 2018-05-22 LAB — MRSA PCR SCREENING: MRSA BY PCR: NEGATIVE

## 2018-05-22 LAB — APTT: aPTT: 34 seconds (ref 24–36)

## 2018-05-22 LAB — HEMOGLOBIN A1C
HEMOGLOBIN A1C: 12.9 % — AB (ref 4.8–5.6)
MEAN PLASMA GLUCOSE: 323.53 mg/dL

## 2018-05-22 LAB — ETHANOL

## 2018-05-22 LAB — AMMONIA: Ammonia: 48 umol/L — ABNORMAL HIGH (ref 9–35)

## 2018-05-22 MED ORDER — INSULIN DETEMIR 100 UNIT/ML ~~LOC~~ SOLN
12.0000 [IU] | Freq: Every day | SUBCUTANEOUS | Status: DC
  Administered 2018-05-22 – 2018-05-23 (×2): 12 [IU] via SUBCUTANEOUS
  Filled 2018-05-22 (×4): qty 0.12

## 2018-05-22 MED ORDER — ACETAMINOPHEN 650 MG RE SUPP: 650.0000 mg | Freq: Four times a day (QID) | RECTAL | Status: DC | PRN

## 2018-05-22 MED ORDER — INSULIN ASPART 100 UNIT/ML ~~LOC~~ SOLN: 0.0000 [IU] | Freq: Every day | SUBCUTANEOUS | Status: DC

## 2018-05-22 MED ORDER — SODIUM CHLORIDE 0.9 % IV SOLN
INTRAVENOUS | Status: DC
  Administered 2018-05-22: 1000 mL via INTRAVENOUS

## 2018-05-22 MED ORDER — DEXTROSE-NACL 5-0.45 % IV SOLN: INTRAVENOUS | Status: DC

## 2018-05-22 MED ORDER — LEVOTHYROXINE SODIUM 75 MCG PO TABS
75.0000 ug | ORAL_TABLET | Freq: Every day | ORAL | Status: DC
  Administered 2018-05-23 – 2018-05-24 (×2): 75 ug via ORAL
  Filled 2018-05-22 (×2): qty 1

## 2018-05-22 MED ORDER — SODIUM CHLORIDE 0.9 % IV SOLN
1.0000 g | Freq: Once | INTRAVENOUS | Status: AC
  Administered 2018-05-22: 1 g via INTRAVENOUS
  Filled 2018-05-22 (×2): qty 10

## 2018-05-22 MED ORDER — SODIUM CHLORIDE 0.9 % IV BOLUS
1000.0000 mL | Freq: Once | INTRAVENOUS | Status: AC
  Administered 2018-05-22: 1000 mL via INTRAVENOUS

## 2018-05-22 MED ORDER — INSULIN ASPART 100 UNIT/ML ~~LOC~~ SOLN
0.0000 [IU] | Freq: Three times a day (TID) | SUBCUTANEOUS | Status: DC
  Administered 2018-05-24: 2 [IU] via SUBCUTANEOUS
  Administered 2018-05-24: 1 [IU] via SUBCUTANEOUS

## 2018-05-22 MED ORDER — SODIUM CHLORIDE 0.9 % IV SOLN
INTRAVENOUS | Status: DC
  Filled 2018-05-22: qty 1

## 2018-05-22 MED ORDER — APIXABAN 5 MG PO TABS
5.0000 mg | ORAL_TABLET | Freq: Two times a day (BID) | ORAL | Status: DC
  Administered 2018-05-22 – 2018-05-24 (×4): 5 mg via ORAL
  Filled 2018-05-22 (×4): qty 1

## 2018-05-22 MED ORDER — MAGNESIUM SULFATE 2 GM/50ML IV SOLN
2.0000 g | Freq: Once | INTRAVENOUS | Status: AC
  Administered 2018-05-22: 2 g via INTRAVENOUS
  Filled 2018-05-22: qty 50

## 2018-05-22 MED ORDER — SODIUM CHLORIDE 0.9 % IV SOLN
INTRAVENOUS | Status: DC
  Administered 2018-05-22: 2.2 [IU]/h via INTRAVENOUS
  Filled 2018-05-22: qty 1

## 2018-05-22 MED ORDER — SODIUM CHLORIDE 0.9 % IV SOLN
1.0000 g | Freq: Once | INTRAVENOUS | Status: AC
  Administered 2018-05-22: 1 g via INTRAVENOUS
  Filled 2018-05-22: qty 10

## 2018-05-22 MED ORDER — SODIUM CHLORIDE 0.9 % IV SOLN
1.0000 g | INTRAVENOUS | Status: DC
  Administered 2018-05-23 – 2018-05-24 (×2): 1 g via INTRAVENOUS
  Filled 2018-05-22 (×2): qty 10

## 2018-05-22 MED ORDER — INSULIN ASPART 100 UNIT/ML ~~LOC~~ SOLN: 5.0000 [IU] | Freq: Three times a day (TID) | SUBCUTANEOUS | Status: DC

## 2018-05-22 MED ORDER — POTASSIUM CHLORIDE 10 MEQ/100ML IV SOLN
10.0000 meq | INTRAVENOUS | Status: AC
  Filled 2018-05-22 (×5): qty 100

## 2018-05-22 MED ORDER — METOPROLOL TARTRATE 25 MG PO TABS
25.0000 mg | ORAL_TABLET | Freq: Two times a day (BID) | ORAL | Status: DC
  Administered 2018-05-22 – 2018-05-24 (×4): 25 mg via ORAL
  Filled 2018-05-22 (×4): qty 1

## 2018-05-22 MED ORDER — POLYETHYLENE GLYCOL 3350 17 G PO PACK: 17.0000 g | PACK | Freq: Every day | ORAL | Status: DC | PRN

## 2018-05-22 MED ORDER — FLUTICASONE FUROATE-VILANTEROL 200-25 MCG/INH IN AEPB
1.0000 | INHALATION_SPRAY | Freq: Every day | RESPIRATORY_TRACT | Status: DC
  Administered 2018-05-24: 1 via RESPIRATORY_TRACT
  Filled 2018-05-22: qty 28

## 2018-05-22 MED ORDER — DEXTROSE-NACL 5-0.45 % IV SOLN
INTRAVENOUS | Status: DC
  Administered 2018-05-22: 14:00:00 via INTRAVENOUS

## 2018-05-22 MED ORDER — OXYCODONE HCL 5 MG PO TABS
5.0000 mg | ORAL_TABLET | Freq: Three times a day (TID) | ORAL | Status: DC | PRN
  Administered 2018-05-23: 5 mg via ORAL
  Filled 2018-05-22: qty 1

## 2018-05-22 MED ORDER — ACETAMINOPHEN 325 MG PO TABS
650.0000 mg | ORAL_TABLET | Freq: Four times a day (QID) | ORAL | Status: DC | PRN
  Administered 2018-05-22: 650 mg via ORAL
  Filled 2018-05-22: qty 2

## 2018-05-22 MED ORDER — POTASSIUM CHLORIDE 10 MEQ/100ML IV SOLN
10.0000 meq | INTRAVENOUS | Status: AC
  Administered 2018-05-22 (×4): 10 meq via INTRAVENOUS
  Filled 2018-05-22 (×3): qty 100

## 2018-05-22 MED ORDER — LACTULOSE 10 GM/15ML PO SOLN
20.0000 g | Freq: Once | ORAL | Status: AC
  Administered 2018-05-22: 20 g via ORAL
  Filled 2018-05-22: qty 30

## 2018-05-22 MED ORDER — RIFAXIMIN 550 MG PO TABS
550.0000 mg | ORAL_TABLET | Freq: Two times a day (BID) | ORAL | Status: DC
  Administered 2018-05-22 – 2018-05-24 (×4): 550 mg via ORAL
  Filled 2018-05-22 (×4): qty 1

## 2018-05-22 NOTE — Progress Notes (Addendum)
[name] [id number] Admission Data: 05/22/2018 4:21 PM Attending Provider: Sid Falcon, MD  VVK:[id number], No Pcp Per Consults/ Treatment Team:   [name] is a 62 y.o. female patient admitted from ED awake, alert  & orientated  X 3,  Full Code, VSS - Blood pressure (!) 147/92, pulse 87, temperature 97.6 F (36.4 C), temperature source Oral, resp. rate 15, SpO2 100 %., , no c/o shortness of breath, no c/o chest pain, no distress noted. Tele # [id number] placed and pt is currently running:normal sinus rhythm.   IV site WDL:  wrist right, condition patent and no redness and left, condition patent and no redness and forearm right, condition patent and no redness with a transparent dsg that's clean dry and intact.   MRSA swab done and CHG complete.  Allergies:   Allergies  Allergen Reactions  . Hydrocodone Other (See Comments)    Pt states that this medication caused cirrhosis of the liver.    . Aspirin   . Erythromycin Other (See Comments)    Reaction:  Fever   . Prednisone Other (See Comments)    Reaction:  Unknown   . Rosiglitazone Maleate Swelling  . Codeine Sulfate Rash  . Tetanus-Diphtheria Toxoids Td Rash and Other (See Comments)    Reaction:  Fever      Past Medical History:  Diagnosis Date  . Allergy   . Anxiety   . Cancer (HCC)    HX OF CANCER OF UTERUS   . Cirrhosis of liver not due to alcohol ([place]) 2016  . Degenerative disk disease   . Diverticulitis   . Gastroparesis   . GERD (gastroesophageal reflux disease)   . History of hiatal hernia   . Hypertension   . Hypothyroid   . Intussusception intestine ([place]) 05/2015  . PAF (paroxysmal atrial fibrillation) ([place]) 03/2015   a. new onset 03/2015 in setting of intractable N/V; b. on Eliquis 5 mg bid; c. CHADSVASc 4 (DM, TIA x 2, female)  . Pancreatitis   . Sick sinus syndrome ([place])   . Stomach ulcer   . Stroke [facility])    with minimal left sided weakness  . Syncope 01/2015  . TIA (transient ischemic attack)  02/2015  . Type 1 diabetes ([place])    on levemir    History:  obtained from the patient. Tobacco/alcohol: denied none  Pt orientation to unit, room and routine. Information packet given to patient/family.  Admission INP armband ID verified with patient/family, and in place. SR up x 2, fall risk assessment complete with Patient and family verbalizing understanding of risks associated with falls. Pt verbalizes an understanding of how to use the call bell and to call for help before getting out of bed.  Skin, clean-dry- intact without evidence of bruising, or skin tears.   No evidence of skin break down noted on exam. no rashes, no ecchymoses, no petechiae    Will cont to monitor and assist as needed.  Rita Henner Margaretha Sheffield, RN 05/22/2018 4:21 PM

## 2018-05-22 NOTE — H&P (Addendum)
Date: 05/22/2018               Patient Name:  [name] MRN: [id number]  DOB: 03-22-1956 Age / Sex: 62 y.o., female   PCP: Patient, No Pcp Per         Medical Service: Internal Medicine Teaching Service         Attending Physician: Dr. Sid Falcon, MD    First Contact: Dr. Koleen Distance Pager: [phone]  Second Contact: Dr. Berline Lopes Pager: [phone]       After Hours (After 5p/  First Contact Pager: [phone]  weekends / holidays): Second Contact Pager: [phone]   Chief Complaint: AMS  History of Present Illness:  This is a 62 year-old female with extensive medical history including uncontrolled type 2 diabetes, atrial fibrillation, cryptogenic cirrhosis, history of CVA with residual L-facial droop, history of NICM, chronic opioid use and drug-induced hypothyroidism who presents to ED by EMS at request of her husband for AMS. Husband not in ED and not available by phone at time of this H&P or evaluation by ED provider. ED reports she was disoriented and unable to provide significant history. This remains true although patient able to tell me she doesn't feel well but unable to clarify. Localizes majority of pain to suprapubic region and admits to burning with urination. She denied any chest pain, cough, shortness of breath, nausea or vomiting.    EMS report shows initial BP 94/66, pulse 120 bpm, RR 16 and saturating 97% on RA. CBG 423. EMS documents husband calling 911 after his wife was "not acting right," was urinating more frequently and was behaving like she does when her blood sugar is too high. They note she was staring off blankly but was responsive to verbal stimuli, and was alert to person and place. She was able to follow basic commands and was able to stand and ambulate to ambulance without abnormality. She was given 400cc ns bolus and transported to [facility] ED.   In the ED, BP 118/76, pulse 100, respirations 11 and she was saturating 97% on RA. No temp recorded. CMET with  sodium 134, potassium 3.2, CO2 18, Cr 2.18 (BL ~1.2), albumin 2.7, AST 64 and T-bili of 1.3. Anion gap corrected for albumin 17. VBG with pH 7.27. B-hydroxybutyric acid elevated at 2.59. UA with >500 glucose, Lg Hgb, 5 ketones, large leukocytes, >50 WBCs and 11-20 RBCs. UDS positive for opiates. CT head w/o contrast without acute abnormality. She was given IV CTX for presumed UTI and admitted to IMTS for work-up of AMS.   Meds: Current outpatient medications, please note these need to be reconciled by pharmacy and husband unavailable to confirm dosing.  -Albuterol prn -Apixaban 52m BID -Atorvastatin 488m-Symbicort 2 puffs BID -Flexeril 1066mID prn -Fluconazole 200m34mily -Furosemide 40mg42mly -Novolog 5 untis TID WC if CBG >130 -Levemir 12 units QHS -Synthroid 75 mcg daily -Lisinopril 5mg d42my -Metoprolol 25mg B31mOxycodone ER 9mg Q8H89m-dur 20mEq da36m-Rifaximin 550mg BID 32mronolactone 100mg daily60mlergies: Allergies as of 05/22/2018 - Review Complete 05/22/2018  Allergen Reaction Noted  . Hydrocodone Other (See Comments) 10/21/2015  . Aspirin  01/28/2016  . Erythromycin Other (See Comments) 12/16/2015  . Prednisone Other (See Comments) 10/04/2015  . Rosiglitazone maleate Swelling   . Codeine sulfate Rash   . Tetanus-diphtheria toxoids td Rash and Other (See Comments)    Past Medical History:  Diagnosis Date  . Allergy   . Anxiety   . Cancer (  [place])    HX OF CANCER OF UTERUS   . Cirrhosis of liver not due to alcohol ([place]) 2016  . Degenerative disk disease   . Diverticulitis   . Gastroparesis   . GERD (gastroesophageal reflux disease)   . History of hiatal hernia   . Hypertension   . Hypothyroid   . Intussusception intestine ([place]) 05/2015  . PAF (paroxysmal atrial fibrillation) ([place]) 03/2015   a. new onset 03/2015 in setting of intractable N/V; b. on Eliquis 5 mg bid; c. CHADSVASc 4 (DM, TIA x 2, female)  . Pancreatitis   . Sick sinus syndrome ([place])   .  Stomach ulcer   . Stroke [facility])    with minimal left sided weakness  . Syncope 01/2015  . TIA (transient ischemic attack) 02/2015  . Type 1 diabetes ([place])    on levemir   Family History: Obtained from chart review due to AMS. Mother alive with HTN. Father deceased. Sister deceased from MI. Brother with CAD.   Social History: Obtained from chart review. Former smoker. No alcohol or substance use recorded.   Review of Systems: A complete ROS was negative except as per HPI.   Physical Exam: Blood pressure 128/72, pulse 97, resp. rate 10, SpO2 98 %. General: Chronically-ill appearing caucasian female sleeping upright in bed. Somnolent but arousable to loud voice. Appears comfortable. HENT: Pupils not pinpoint and were equal, round and reactive BL. No conjunctival injection, icterus or ptosis. Oropharynx clear, mucous membranes dry.  Cardiovascular: Tachycardic and irregular. No murmur appreciated. Pulmonary: Breathing unlabored and saturating well on RA. Did not cooperate fully with exam but CTA BL, no wheezing, crackles or rhonchi appreciated. Abdomen: Soft, not distended. Mild TTP suprapubic region but otherwise non-tender. Normoactive bowel sounds.  Extremities: Warm and perfused. Trace peripheral edema.  Neuro: Did not cooperate fully with neurologic exam however moves extremities spontaneously. Tongue does not deviate on protrusion. Subtle left facial droop. Psych: Somnolent. Not oriented to person ("I feel like I should know this"), place or time (1990). Mood normal and affect was mood congruent.  CXR: personally reviewed my interpretation is no acute cardiopulmonary process.  CT head without contrast: No acute intracranial abnormality  CMP Latest Ref Rng & Units 05/22/2018 05/22/2018 12/30/2017  Glucose 70 - 99 mg/dL 348(H) 349(H) 180(H)  BUN 8 - 23 mg/dL 22 21 10   Creatinine 0.44 - 1.00 mg/dL 2.10(H) 2.18(H) 1.20(H)  Sodium 135 - 145 mmol/L 135 134(L) 136  Potassium 3.5 - 5.1 mmol/L  3.2(L) 3.2(L) 3.5  Chloride 98 - 111 mmol/L 100 102 108  CO2 22 - 32 mmol/L - 18(L) 22  Calcium 8.9 - 10.3 mg/dL - 8.5(L) 7.5(L)  Total Protein 6.5 - 8.1 g/dL - 6.4(L) -  Total Bilirubin 0.3 - 1.2 mg/dL - 1.3(H) -  Alkaline Phos 38 - 126 U/L - 107 -  AST 15 - 41 U/L - 64(H) -  ALT 0 - 44 U/L - 28 -   CBC Latest Ref Rng & Units 05/22/2018 05/22/2018 12/29/2017  WBC 4.0 - 10.5 K/uL - 7.3 7.8  Hemoglobin 12.0 - 15.0 g/dL 10.2(L) 9.5(L) 8.5(L)  Hematocrit 36.0 - 46.0 % 30.0(L) 31.1(L) 26.5(L)  Platelets 150 - 400 K/uL - 168 138(L)   Urinalysis    Component Value Date/Time   COLORURINE AMBER (A) 05/22/2018 0640   APPEARANCEUR CLOUDY (A) 05/22/2018 0640   APPEARANCEUR Clear 10/08/2014 0144   LABSPEC 1.017 05/22/2018 0640   LABSPEC 1.033 10/08/2014 0144   PHURINE 5.0 05/22/2018 0640  GLUCOSEU >=500 (A) 05/22/2018 0640   GLUCOSEU >=500 10/08/2014 0144   HGBUR LARGE (A) 05/22/2018 0640   BILIRUBINUR NEGATIVE 05/22/2018 0640   BILIRUBINUR Negative 10/08/2014 0144   KETONESUR 5 (A) 05/22/2018 0640   PROTEINUR 100 (A) 05/22/2018 0640   UROBILINOGEN 0.2 05/27/2015 1936   NITRITE NEGATIVE 05/22/2018 0640   LEUKOCYTESUR LARGE (A) 05/22/2018 0640   LEUKOCYTESUR Negative 10/08/2014 0144   Assessment & Plan by Problem: Principal Problem:   Acute encephalopathy Active Problems:   Uncontrolled type 2 diabetes mellitus (HCC)   Cryptogenic cirrhosis (HCC)   Diabetic ketoacidosis (HCC)   UTI (urinary tract infection)  Diabetic Ketoacidosis Uncontrolled Type 2 Diabetes (A1c 12.9%)  Patient with hyperglycemia, high anion-gap metabolic acidosis (gap 14, 17 with correction for albumin) and elevated Beta-hydroxybutyric acid at 2.6. UA also with glucosuria and ketones. She thinks she's been taking her insulin recently and husband reported her presentation is consistent with prior episodes of hyperglycemia per EMS report. She does admit to dysuria and suprapubic pain and UA does suggest infection.  Will treat with IVF, insulin, antibiotics and electrolyte replacements but do need to exercise caution as patient has history of CHF. Home regimen includes Levemir 12 units QHS and Novolog 5 units TID WC (CBG >130).  -Admit to SDU for insulin ggt -DKA protocol: CBG Q2H, adjust insulin ggt via glucomander  -2L ns over 4 hrs, then IVF @ 125cc/hr -K initially < 3.5, IV potassium ordered and will check repeat BMET @ 2pm -Consider Lantus 5 units + SSI WC once DKA resolved and tolerating PO  -PT/OT evaluation tomorrow morning when mental status improves  Urinary Tract Infection UA with rare bacteria, large leukocytes, >50 WBCs and RBCs. She admits to dysuria and suprapubic pain. UTI could be possible trigger for DKA and I think it's reasonable to treat for UTI given her symptoms and urinalysis findings. Hx VRE 2017 -CTX given in ED, continued -Urine culture and Blood cultures pending  Acute on Chronic Renal Failure Chronic Microscopic Hematuria Cr 2.18 on admission, baseline ~ 1-1.2. Suspect pre-renal due to hypovolemia and expect improvement with IVF and holding nephrotoxic medications (lisinopril, Spironolactone). UA with microscopic hematuria and I see this is an ongoing issue. Unclear if this has been evaluated outpatient. -Follow renal function  Cryptogenic Cirrhosis On Rifaximin 550 mg BID at home for chronic hepatic encephalopathy. She is disoriented on examination and there could be a component of hepatic encephalopathy. Will administer PO Lactulose x1 and continue Rifaximin. Abdomen is not distended and has only suprapubic TTP. Doubt bacterial peritonitis. She is on Spironolactone 174m and Lasix 458mdaily which are being held on admission due to AKI and no evidence for significant ascites or volume overload.  -PO Lactulose x1 -Continue Rifaximin -Holding Spironolactone, Lasix  Chronic Atrial Fibrillation Patient on Eliquis 34m64mID and Metoprolol 234m17mD at home. Her BP and HR have  improved significantly since receiving IVF and suspect she would tolerate resuming her BB this evening if her HR and BP remain stable.  -Metoprolol 234mg59m -Eliquis 34mg B72m Chronic Opioid Use Chronic Pain Syndrome Patient on Oxycodone ER 9mg Q852mt home and confirmed this is a chronic prescription by reviewing the [place] Controlled Substance Database. Certainly would like to avoid withdrawal given her long-term use however recommend judicious use while patient with AMS.  -Oxycodone IR 34mg Q8H42mn severe pain, use judiciously  Hypothyroidism Chart cites amiodarone-induced hypothyroidism and is on levothyroxine 75 mcg daily. Checking TSH and continuing home regimen.  NICM History of Stress-Induced Cardiomyopathy LHC 01/2016 without obstructive disease but did suggest stress-induced cardiomyopathy. ECHO 12/2017 without reduced EF but unable to evaluate diastolic dysfunction. No significant fluid on examination. Continue medications as above. She has an unspecified allergy to aspirin. -Caution with IVF  COPD Lungs clear and she is saturating well on RA. On Symbicort, prn albuterol at home.  -Continuing Breo here.   *Of note Diflucan is listed as a daily medication however unsure of indication. This is being held on admission. Please evaluate necessity when patients mental status improves  Diet: NPO while on insulin ggt, HH/carb mod once able to tolerate PO IVF: 2L ns, 125cc/hr Electrolytes: Potassium 3.2, replaced IV. Follow closely during treatment of DKA. Mag pending DVT Ppx: Eliquis Code Status: FULL - patient unable to discuss code status on admission and husband not reachable. FULL code during recent admissions.  Dispo: Admit patient to Inpatient with expected length of stay greater than 2 midnights.  SignedEinar Gip, DO 05/22/2018, 10:21 AM  Pager: [phone]

## 2018-05-22 NOTE — ED Provider Notes (Signed)
[facility] EMERGENCY DEPARTMENT Provider Note   CSN: [id number] Arrival date & time: 05/22/18  0320     History   Chief Complaint No chief complaint on file.   HPI [name] is a 62 y.o. female.  Patient brought to the emergency department from home by ambulance for confusion.  Patient was noted by her husband to be confused from her baseline.  Husband not present here in the ER at this time.  Patient cannot answer simple questions such as date, time, where she is. Level V Caveat due to confusion.     Past Medical History:  Diagnosis Date  . Allergy   . Anxiety   . Cancer (HCC)    HX OF CANCER OF UTERUS   . Cirrhosis of liver not due to alcohol ([place]) 2016  . Degenerative disk disease   . Diverticulitis   . Gastroparesis   . GERD (gastroesophageal reflux disease)   . History of hiatal hernia   . Hypertension   . Hypothyroid   . Intussusception intestine ([place]) 05/2015  . PAF (paroxysmal atrial fibrillation) ([place]) 03/2015   a. new onset 03/2015 in setting of intractable N/V; b. on Eliquis 5 mg bid; c. CHADSVASc 4 (DM, TIA x 2, female)  . Pancreatitis   . Sick sinus syndrome ([place])   . Stomach ulcer   . Stroke [facility])    with minimal left sided weakness  . Syncope 01/2015  . TIA (transient ischemic attack) 02/2015  . Type 1 diabetes ([place])    on levemir    Patient Active Problem List   Diagnosis Date Noted  . GI bleed 12/28/2017  . Melena 12/27/2017  . Sepsis ([place]) 12/15/2017  . Right ureteral stone 07/14/2016  . Personal history of surgery to heart and great vessels, presenting hazards to health   . Gastritis   . Foreign body in stomach   . Abnormal findings-gastrointestinal tract   . Congestive dilated cardiomyopathy ([place])   . Generalized abdominal pain   . Nausea & vomiting   . NSTEMI (non-ST elevated myocardial infarction) ([place]) 01/11/2016  . Tachyarrhythmia 01/10/2016  . Tachy-brady syndrome ([place]) 12/28/2015  . Colitis 12/16/2015    . Pneumonia 11/14/2015  . Narcotic withdrawal ([place]) 11/11/2015  . Orthostatic hypotension   . H/O TIA (transient ischemic attack) and stroke   . Left-sided weakness 10/04/2015  . Ileus ([place]) 08/01/2015  . Ascites   . Cryptogenic cirrhosis ([place]) 07/10/2015  . Paroxysmal atrial fibrillation ([place]) 07/10/2015  . C. difficile colitis 07/10/2015  . GI (gastrointestinal bleed) 07/06/2015  . Malnutrition of moderate degree 07/04/2015  . Symptomatic bradycardia 05/27/2015  . Intussusception intestine ([place])   . Hypothyroidism due to amiodarone   . Abdominal pain, chronic, epigastric   . Chronic anemia 05/19/2015  . Thrombocytopenia ([place]) 05/19/2015  . Prolonged QT interval   . Hypomagnesemia   . Narcotic abuse ([place])   . Uncontrolled type 2 diabetes mellitus ([place])   . Syncope due to orthostatic hypotension 05/18/2015  . Hypokalemia 04/06/2015  . Hyperlipidemia with target LDL less than 100 04/06/2015  . CVA (cerebral infarction) 02/15/2015  . Essential hypertension 01/12/2015  . Chronically on opiate therapy 01/12/2015  . Gastroparesis 01/12/2015  . DEPRESSION/ANXIETY 06/27/2007  . MYOFASCIAL PAIN SYNDROME 06/27/2007  . Chronic pain syndrome 03/28/2007  . GERD 03/27/2007  . DIVERTICULOSIS, COLON 03/27/2007  . LUMBAR DISC DISPLACEMENT 03/27/2007  . PROTEINURIA 03/27/2007  . UTERINE CANCER, HX OF 03/27/2007    Past Surgical History:  Procedure Laterality Date  . ABDOMINAL HYSTERECTOMY    . CARDIAC CATHETERIZATION N/A 01/12/2016   Procedure: Left Heart Cath and Coronary Angiography;  Surgeon: Wellington Hampshire, MD;  Location: [facility] CV LAB;  Service: Cardiovascular;  Laterality: N/A;  . CHOLECYSTECTOMY    . CYSTOSCOPY/URETEROSCOPY/HOLMIUM LASER Right 07/14/2016   Procedure: CYSTOSCOPY/URETEROSCOPY/HOLMIUM LASER;  Surgeon: Alexis Frock, MD;  Location: [facility] ORS;  Service: Urology;  Laterality: Right;  . ESOPHAGOGASTRODUODENOSCOPY N/A 04/04/2015   Procedure:  ESOPHAGOGASTRODUODENOSCOPY (EGD);  Surgeon: Hulen Luster, MD;  Location[facility] ENDOSCOPY;  Service: Endoscopy;  Laterality: N/A;  . ESOPHAGOGASTRODUODENOSCOPY N/A 12/28/2017   Procedure: ESOPHAGOGASTRODUODENOSCOPY (EGD);  Surgeon: Lin Landsman, MD;  Location: [facility] ENDOSCOPY;  Service: Gastroenterology;  Laterality: N/A;  . ESOPHAGOGASTRODUODENOSCOPY (EGD) WITH PROPOFOL N/A 01/18/2016   Procedure: ESOPHAGOGASTRODUODENOSCOPY (EGD) WITH PROPOFOL;  Surgeon: Lucilla Lame, MD;  Location: [facility] ENDOSCOPY;  Service: Endoscopy;  Laterality: N/A;  . FLEXIBLE SIGMOIDOSCOPY N/A 01/18/2016   Procedure: FLEXIBLE SIGMOIDOSCOPY;  Surgeon: Lucilla Lame, MD;  Location: [facility] ENDOSCOPY;  Service: Endoscopy;  Laterality: N/A;  . HERNIA REPAIR       OB History    Gravida  1   Para  1   Term  1   Preterm      AB      Living        SAB      TAB      Ectopic      Multiple      Live Births               Home Medications    Prior to Admission medications   Medication Sig Start Date End Date Taking? Authorizing Provider  albuterol (PROVENTIL HFA;VENTOLIN HFA) 108 (90 Base) MCG/ACT inhaler Inhale 2 puffs into the lungs every 6 (six) hours as needed for wheezing or shortness of breath. 12/17/17   Henreitta Leber, MD  apixaban (ELIQUIS) 5 MG TABS tablet Take 1 tablet (5 mg total) by mouth 2 (two) times daily. 12/17/17 01/16/18  Henreitta Leber, MD  atorvastatin (LIPITOR) 40 MG tablet Take 1 tablet (40 mg total) by mouth at bedtime. 12/17/17 01/16/18  Henreitta Leber, MD  budesonide-formoterol (SYMBICORT) 160-4.5 MCG/ACT inhaler Inhale 2 puffs into the lungs 2 (two) times daily. 12/17/17   Henreitta Leber, MD  cyclobenzaprine (FLEXERIL) 10 MG tablet Take 10 mg by mouth 3 (three) times daily as needed for muscle spasms.  11/18/17   [provider]  fluconazole (DIFLUCAN) 200 MG tablet Take 1 tablet (200 mg total) by mouth daily. 12/31/17   Saundra Shelling, MD  furosemide (LASIX) 40 MG tablet Take 1  tablet (40 mg total) by mouth daily. 12/31/17   Saundra Shelling, MD  insulin aspart (NOVOLOG FLEXPEN) 100 UNIT/ML FlexPen Inject 5 units into the skin with meals for blood sugars above 130. 12/30/17   Pyreddy, Reatha Harps, MD  Insulin Detemir (LEVEMIR FLEXPEN) 100 UNIT/ML Pen Inject 12 Units into the skin every evening. 12/30/17   Saundra Shelling, MD  levothyroxine (SYNTHROID, LEVOTHROID) 75 MCG tablet Take 1 tablet (75 mcg total) by mouth daily before breakfast. 12/17/17 01/16/18  Henreitta Leber, MD  lisinopril (PRINIVIL,ZESTRIL) 5 MG tablet Take 1 tablet by mouth every morning for kidney protection against diabetes. 12/17/17   Henreitta Leber, MD  metoprolol tartrate (LOPRESSOR) 25 MG tablet Take 1 tablet (25 mg total) by mouth 2 (two) times daily. 12/17/17 01/16/18  Henreitta Leber, MD  oxyCODONE ER [facility] ER) 9  MG C12A Take 9 mg by mouth every 8 (eight) hours.    [provider]  potassium chloride 20 MEQ TBCR Take 20 mEq by mouth daily. 12/30/17   Saundra Shelling, MD  rifaximin (XIFAXAN) 550 MG TABS tablet Take 1 tablet (550 mg total) by mouth 2 (two) times daily. 12/30/17   Saundra Shelling, MD  spironolactone (ALDACTONE) 100 MG tablet Take 1 tablet (100 mg total) by mouth daily. 12/31/17   Saundra Shelling, MD    Family History Family History  Problem Relation Age of Onset  . Hypertension Mother   . CAD Sister   . Heart attack Sister        Deceased 2014/11/30  . CAD Brother     Social History Social History   Tobacco Use  . Smoking status: Former Smoker    Types: Cigarettes  . Smokeless tobacco: Never Used  . Tobacco comment: 25 years ago and only smoked occasionally  Substance Use Topics  . Alcohol use: No  . Drug use: No     Allergies   Hydrocodone; Aspirin; Erythromycin; Prednisone; Rosiglitazone maleate; Codeine sulfate; and Tetanus-diphtheria toxoids td   Review of Systems Review of Systems  Unable to perform ROS: Mental status change     Physical Exam Updated Vital  Signs BP 118/76   Pulse 100   Resp 13   SpO2 97%   Physical Exam  Constitutional: She appears well-developed and well-nourished.  HENT:  Head: Atraumatic.  Eyes: Pupils are equal, round, and reactive to light.  Neck: Normal range of motion. Neck supple.  Cardiovascular: Regular rhythm and normal heart sounds. Tachycardia present.  Pulmonary/Chest: Effort normal and breath sounds normal.  Abdominal: Soft.  Musculoskeletal: Normal range of motion. She exhibits no deformity.  Neurological: She is disoriented. She displays no tremor. No cranial nerve deficit or sensory deficit. She displays no seizure activity. GCS eye subscore is 4. GCS verbal subscore is 4. GCS motor subscore is 6.     ED Treatments / Results  Labs (all labs ordered are listed, but only abnormal results are displayed) Labs Reviewed  URINALYSIS, ROUTINE W REFLEX MICROSCOPIC - Abnormal; Notable for the following components:      Result Value   Color, Urine AMBER (*)    APPearance CLOUDY (*)    Glucose, UA >=500 (*)    Hgb urine dipstick LARGE (*)    Ketones, ur 5 (*)    Protein, ur 100 (*)    Leukocytes, UA LARGE (*)    WBC, UA >50 (*)    Bacteria, UA RARE (*)    All other components within normal limits  RAPID URINE DRUG SCREEN, HOSP PERFORMED - Abnormal; Notable for the following components:   Opiates POSITIVE (*)    All other components within normal limits  AMMONIA  ETHANOL  BETA-HYDROXYBUTYRIC ACID    EKG None  Radiology No results found.  Procedures Procedures (including critical care time)  Medications Ordered in ED Medications - No data to display   Initial Impression / Assessment and Plan / ED Course  I have reviewed the triage vital signs and the nursing notes.  Pertinent labs & imaging results that were available during my care of the patient were reviewed by me and considered in my medical decision making (see chart for details).     Patient presents with altered mental status.   Patient cannot provide much information at this time.  She has a history of cirrhosis, hepatic encephalopathy is considered.  Infection is  also considered, urinalysis does look infected, however, previous urinalysis's looked similar and cultures did not show pathogens.  Will perform CT head, chest x-ray in addition to the basic labs.  Will sign out to oncoming ER physician to follow-up on imaging and lab studies, patient will likely require hospitalization.  Final Clinical Impressions(s) / ED Diagnoses   Final diagnoses:  Acute encephalopathy    ED Discharge Orders    None       Joycelynn Fritsche, Gwenyth Allegra, MD 05/22/18 [phone]

## 2018-05-22 NOTE — Progress Notes (Signed)
Inpatient Diabetes Program Recommendations  AACE/ADA: New Consensus Statement on Inpatient Glycemic Control (2015)  Target Ranges:  Prepandial:   less than 140 mg/dL      Peak postprandial:   less than 180 mg/dL (1-2 hours)      Critically ill patients:  140 - 180 mg/dL   Lab Results  Component Value Date   GLUCAP 275 (H) 05/22/2018   HGBA1C 14.1 (H) 12/29/2017    Review of Glycemic Control  Diabetes history: DM 2 Outpatient Diabetes medications: Levemir 12 units qhs, Novolog 5 units for glucose over 130 mg/dl  Inpatient Diabetes Program Recommendations:    Glucose 275, down from 300's on arrival. Renal function elevated. Consider Levemir 8 units, Novolog 0-9 units tid, Novolog 0-5 units qhs while here.  A1c ordered to determine glucose control over the past 2-3 months.  Thanks,  Tama Headings RN, MSN, BC-ADM Inpatient Diabetes Coordinator Team Pager [phone] (8a-5p)

## 2018-05-22 NOTE — ED Notes (Signed)
See paper charting for downtime.

## 2018-05-23 DIAGNOSIS — I4891 Unspecified atrial fibrillation: Secondary | ICD-10-CM

## 2018-05-23 DIAGNOSIS — B3749 Other urogenital candidiasis: Secondary | ICD-10-CM

## 2018-05-23 DIAGNOSIS — Z7901 Long term (current) use of anticoagulants: Secondary | ICD-10-CM

## 2018-05-23 DIAGNOSIS — E1110 Type 2 diabetes mellitus with ketoacidosis without coma: Principal | ICD-10-CM

## 2018-05-23 DIAGNOSIS — E039 Hypothyroidism, unspecified: Secondary | ICD-10-CM

## 2018-05-23 DIAGNOSIS — E875 Hyperkalemia: Secondary | ICD-10-CM

## 2018-05-23 DIAGNOSIS — N179 Acute kidney failure, unspecified: Secondary | ICD-10-CM

## 2018-05-23 DIAGNOSIS — K7469 Other cirrhosis of liver: Secondary | ICD-10-CM

## 2018-05-23 DIAGNOSIS — Z794 Long term (current) use of insulin: Secondary | ICD-10-CM

## 2018-05-23 DIAGNOSIS — R1013 Epigastric pain: Secondary | ICD-10-CM

## 2018-05-23 DIAGNOSIS — Z79899 Other long term (current) drug therapy: Secondary | ICD-10-CM

## 2018-05-23 DIAGNOSIS — I69392 Facial weakness following cerebral infarction: Secondary | ICD-10-CM

## 2018-05-23 DIAGNOSIS — R410 Disorientation, unspecified: Secondary | ICD-10-CM

## 2018-05-23 DIAGNOSIS — Z79891 Long term (current) use of opiate analgesic: Secondary | ICD-10-CM

## 2018-05-23 DIAGNOSIS — N189 Chronic kidney disease, unspecified: Secondary | ICD-10-CM

## 2018-05-23 DIAGNOSIS — E1122 Type 2 diabetes mellitus with diabetic chronic kidney disease: Secondary | ICD-10-CM

## 2018-05-23 DIAGNOSIS — I428 Other cardiomyopathies: Secondary | ICD-10-CM

## 2018-05-23 LAB — BASIC METABOLIC PANEL
ANION GAP: 10 (ref 5–15)
BUN: 14 mg/dL (ref 8–23)
CALCIUM: 8.5 mg/dL — AB (ref 8.9–10.3)
CO2: 19 mmol/L — ABNORMAL LOW (ref 22–32)
Chloride: 108 mmol/L (ref 98–111)
Creatinine, Ser: 1.12 mg/dL — ABNORMAL HIGH (ref 0.44–1.00)
GFR calc non Af Amer: 51 mL/min — ABNORMAL LOW (ref >60)
GFR, EST AFRICAN AMERICAN: 59 mL/min — AB (ref >60)
Glucose, Bld: 123 mg/dL — ABNORMAL HIGH (ref 70–99)
POTASSIUM: 5.3 mmol/L — AB (ref 3.5–5.1)
Sodium: 137 mmol/L (ref 135–145)

## 2018-05-23 LAB — MAGNESIUM: MAGNESIUM: 2 mg/dL (ref 1.7–2.4)

## 2018-05-23 LAB — URINE CULTURE

## 2018-05-23 LAB — CBC
HCT: 34.1 % — ABNORMAL LOW (ref 36.0–46.0)
HEMOGLOBIN: 10.4 g/dL — AB (ref 12.0–15.0)
MCH: 28.3 pg (ref 26.0–34.0)
MCHC: 30.5 g/dL (ref 30.0–36.0)
MCV: 92.7 fL (ref 78.0–100.0)
PLATELETS: 172 10*3/uL (ref 150–400)
RBC: 3.68 MIL/uL — AB (ref 3.87–5.11)
RDW: 15.6 % — ABNORMAL HIGH (ref 11.5–15.5)
WBC: 8.4 10*3/uL (ref 4.0–10.5)

## 2018-05-23 LAB — RENAL FUNCTION PANEL
ANION GAP: 8 (ref 5–15)
Albumin: 2.5 g/dL — ABNORMAL LOW (ref 3.5–5.0)
BUN: 12 mg/dL (ref 8–23)
CO2: 18 mmol/L — ABNORMAL LOW (ref 22–32)
Calcium: 8.4 mg/dL — ABNORMAL LOW (ref 8.9–10.3)
Chloride: 110 mmol/L (ref 98–111)
Creatinine, Ser: 1.09 mg/dL — ABNORMAL HIGH (ref 0.44–1.00)
GFR, EST NON AFRICAN AMERICAN: 52 mL/min — AB (ref >60)
Glucose, Bld: 106 mg/dL — ABNORMAL HIGH (ref 70–99)
PHOSPHORUS: 2.2 mg/dL — AB (ref 2.5–4.6)
POTASSIUM: 4.5 mmol/L (ref 3.5–5.1)
Sodium: 136 mmol/L (ref 135–145)

## 2018-05-23 LAB — GLUCOSE, CAPILLARY
GLUCOSE-CAPILLARY: 92 mg/dL (ref 70–99)
GLUCOSE-CAPILLARY: 80 mg/dL (ref 70–99)
GLUCOSE-CAPILLARY: 184 mg/dL — AB (ref 70–99)
Glucose-Capillary: 109 mg/dL — ABNORMAL HIGH (ref 70–99)

## 2018-05-23 MED ORDER — FLUCONAZOLE 100 MG PO TABS: 200.0000 mg | ORAL_TABLET | Freq: Every day | ORAL | Status: DC

## 2018-05-23 MED ORDER — POTASSIUM CHLORIDE 10 MEQ/100ML IV SOLN
10.0000 meq | INTRAVENOUS | Status: AC
  Administered 2018-05-23 (×4): 10 meq via INTRAVENOUS
  Filled 2018-05-23 (×4): qty 100

## 2018-05-23 MED ORDER — K PHOS MONO-SOD PHOS DI & MONO 155-852-130 MG PO TABS
500.0000 mg | ORAL_TABLET | Freq: Once | ORAL | Status: AC
  Administered 2018-05-23: 500 mg via ORAL
  Filled 2018-05-23: qty 2

## 2018-05-23 MED ORDER — POTASSIUM CHLORIDE CRYS ER 20 MEQ PO TBCR
40.0000 meq | EXTENDED_RELEASE_TABLET | Freq: Once | ORAL | Status: AC
  Administered 2018-05-23: 40 meq via ORAL
  Filled 2018-05-23: qty 2

## 2018-05-23 NOTE — Care Management Note (Addendum)
Case Management Note  Patient Details  Name: [name] MRN: [id number] Date of Birth: 10-19-1955  Subjective/Objective:     Admitted 05/22/18 for AMS; DKA and UTI. From home with husband, [name]. Husband states PTA pt independent  with ADL's , no DME usage.   ODENA MCQUAID (Spouse) [place] Prejean [place])      [phone]     PCP: [facility] ,[place], [place]  Action/Plan: Transition to home with home health services to follow when medically stable. Pt refused SNF recommendations.Referral made with [facility] for home health services.  Expected Discharge Date:                  Expected Discharge Plan:  [place]  In-House Referral:  Clinical Social Work  Discharge planning Services  CM Consult  Post Acute Care Choice:    Choice offered to:  Spouse  DME Arranged:    DME Agency:     HH Arranged:  PT, RN, NA [place] Agency:  [place], orders placed, MD to cosign, face to face needed prior to d/c.  Status of Service:  In process, will continue to follow  If discussed at Long Length of Stay Meetings, dates discussed:    Additional Comments:  Sharin Mons, RN 05/23/2018, 5:01 PM

## 2018-05-23 NOTE — Progress Notes (Addendum)
   Subjective:  No acute events overnight. Patient reports she slept well. Reports some epigastric pain. Oriented and able to easily participate in light conversation, becomes confused when asked directed questions.  Objective:  Vital signs in last 24 hours: Vitals:   05/23/18 0300 05/23/18 0605 05/23/18 0807 05/23/18 1219  BP: 132/65 122/72 121/76 (!) 110/57  Pulse: 76 84 86 72  Resp: (!) 24 (!) 21 20 (!) 22  Temp: 98.7 F (37.1 C) 98.1 F (36.7 C) 98.3 F (36.8 C) 98.4 F (36.9 C)  TempSrc: Oral Oral Oral Oral  SpO2: 98% 98% 100% 100%  Height:       Weight change:   Intake/Output Summary (Last 24 hours) at 05/23/2018 1347 Last data filed at 05/23/2018 1021 Gross per 24 hour  Intake 937.29 ml  Output -  Net 937.29 ml   General: Lying comfortably in bed. No acute distress. Cardio: RRR, no m/r/g. Pulm: Clear breath sounds in bilateral frontal lung fields. Abdominal: Normal bowel sounds. Tenderness to deep palpation in all four quadrants. No suprapubic pain. Neuro: Oriented to person, not to place or time.   Assessment/Plan:  Principal Problem:   Acute encephalopathy Active Problems:   Uncontrolled type 2 diabetes mellitus (HCC)   Cryptogenic cirrhosis (HCC)   Diabetic ketoacidosis (HCC)   UTI (urinary tract infection)  This is a 62 year-old female with extensive medical history including uncontrolled type 2 diabetes, atrial fibrillation, cryptogenic cirrhosis, history of CVA with residual L-facial droop, history of NICM, chronic opioid use and drug-induced hypothyroidism who presented to ED by EMS at request of her husband for AMS.  Diabetic Ketoacidosis Uncontrolled Type 2 Diabetes Transitioned off insulin infusion overnight and started on home Levemir 12 units QHS with sliding scale insulin. CBG this morning 109 and seems to be tolerating PO intake.   -CBG q4 hours -continue home insulin regimen  Hyperkalemia, Hypomagnesemia  K 5.3 this morning following  aggressive repletion during DKA. Repeat BMET and Mag ordered this afternoon.   UTI UA with >100,000 yeast. -restart home fluconazole 200 mg daily -d/c ceftriaxone  Acute on Chronic Kidney Disease Cr 2.18 on admission, 1.12 this morning after IVF and holding Lisinopril, Spiro and Lasix.   Cirrhosis Received Lactulose x1 yesterday and continued Rifaximin. Held spironolactone and lasix due to AKI and dehydration.  Atrial Fibrillation/Flutter Rate controlled on home Metoprolol 40m BID. Continuing Eliquis 551mBID.  Dispo: Anticipated discharge in approximately 1-2 day(s). PT recommends 24-hr supervision/assitance and pt declines SNF placement. No equipment recommended for discharge.    LOS: 1 day   WiAleen CampiMedical Student 05/23/2018, 1:47 PM  Attestation for Student Documentation:  I re-performed the history, physical exam and medical decision-making activities of this service and have verified that the service and findings are accurately documented in the student's note.  Caidan Hubbert, DO 05/23/2018, 2:59 PM

## 2018-05-23 NOTE — Progress Notes (Signed)
Pharmacy made aware that Diflucan is not available.

## 2018-05-23 NOTE — Evaluation (Signed)
Physical Therapy Evaluation Patient Details Name: [name] MRN: [id number] DOB: 07-05-1956 Today's Date: 05/23/2018   History of Present Illness  Pt is a 62 y.o. female admitted 05/22/18 for AMS; worked up for DKA and UTI. CT head without acute abnormality. PMH includes uncontrolled DM, a-fib, CVA, DDD, HTN, anxiety, chronic opioid use.    Clinical Impression  Pt presents with an overall decrease in functional mobility secondary to above. PTA, pt reports mod indep with RW; lives with husband available for 24/7 support. Today, pt required close min guard ambulating with RW; pt with unsteady gait, generalized weakness, very poor awareness and decreased attention, putting her at significant risk for falls. Multiple attempts made to reorient pt to today's date, but still unable to recall when asked. Seems to be answering questions regarding home setup and PLOF appropriately. Pt declining recommendation for SNF-level thearpies. Will follow acutely to address established goals.     Follow Up Recommendations Home health PT;Supervision/Assistance - 24 hour(declining SNF)    Equipment Recommendations  None recommended by PT    Recommendations for Other Services       Precautions / Restrictions Precautions Precautions: Fall Restrictions Weight Bearing Restrictions: No      Mobility  Bed Mobility Overal bed mobility: Modified Independent             General bed mobility comments: HOB elevated  Transfers Overall transfer level: Needs assistance Equipment used: Rolling walker (2 wheeled) Transfers: Sit to/from Stand Sit to Stand: Supervision         General transfer comment: Stood from bed and lower toilet with RW and supervision; increased time and effort, heavy reliance on BUE support  Ambulation/Gait Ambulation/Gait assistance: Min guard Gait Distance (Feet): 20 Feet Assistive device: Rolling walker (2 wheeled) Gait Pattern/deviations: Step-to pattern;Decreased stride  length;Staggering right;Staggering left Gait velocity: Decreased Gait velocity interpretation: 1.31 - 2.62 ft/sec, indicative of limited community ambulator General Gait Details: Slow, unsteady amb to/from bathroom with RW and close min guard for balance; cues to maintain proximity to RW as pt stepping outside of it or turning it sideways  Stairs            Wheelchair Mobility    Modified Rankin (Stroke Patients Only)       Balance Overall balance assessment: Needs assistance   Sitting balance-Leahy Scale: Fair       Standing balance-Leahy Scale: Fair Standing balance comment: Stood at sink to perform ADL tasks with and without UE support, close min guard                             Pertinent Vitals/Pain Pain Assessment: Faces Faces Pain Scale: Hurts a little bit Pain Location: Back Pain Descriptors / Indicators: Sore Pain Intervention(s): Monitored during session    Home Living Family/patient expects to be discharged to:: Private residence Living Arrangements: Spouse/significant other Available Help at Discharge: Family;Available 24 hours/day Type of Home: Mobile home Home Access: Level entry     Home Layout: One level Home Equipment: Walker - 2 wheels;Shower seat;Cane - single point Additional Comments: Pt reports husband available for 24/7 support. Son can also help    Prior Function Level of Independence: Independent with assistive device(s)         Comments: Pt reports mod indep with RW; indep with ADLs     Hand Dominance        Extremity/Trunk Assessment   Upper Extremity Assessment Upper Extremity Assessment: Generalized  weakness    Lower Extremity Assessment Lower Extremity Assessment: Generalized weakness    Cervical / Trunk Assessment Cervical / Trunk Assessment: Other exceptions Cervical / Trunk Exceptions: L lateral flexion (pt reports baseline)  Communication   Communication: No difficulties  Cognition  Arousal/Alertness: Awake/alert Behavior During Therapy: WFL for tasks assessed/performed Overall Cognitive Status: No family/caregiver present to determine baseline cognitive functioning Area of Impairment: Orientation;Attention;Memory;Following commands;Safety/judgement;Awareness;Problem solving                 Orientation Level: Disoriented to;Place;Time;Situation Current Attention Level: Sustained Memory: Decreased short-term memory Following Commands: Follows one step commands inconsistently Safety/Judgement: Decreased awareness of deficits Awareness: Intellectual Problem Solving: Slow processing;Decreased initiation;Difficulty sequencing;Requires verbal cues General Comments: Multiple attempts to reorient to current date, but pt unable to recall. [place] to answer majority of questions appropriately. Poor attention and awareness, forgetting what she was saying mid-sentence, require repeated redirection to task      General Comments General comments (skin integrity, edema, etc.): VSS on RA    Exercises     Assessment/Plan    PT Assessment Patient needs continued PT services  PT Problem List Decreased strength;Decreased activity tolerance;Decreased balance;Decreased mobility;Decreased cognition;Decreased knowledge of use of DME;Decreased safety awareness       PT Treatment Interventions DME instruction;Gait training;Functional mobility training;Therapeutic activities;Therapeutic exercise;Balance training;Patient/family education;Cognitive remediation    PT Goals (Current goals can be found in the Care Plan section)  Acute Rehab PT Goals Patient Stated Goal: Return home with help from husband PT Goal Formulation: With patient Time For Goal Achievement: 05/30/18 Potential to Achieve Goals: Good    Frequency Min 3X/week   Barriers to discharge        Co-evaluation               AM-PAC PT "6 Clicks" Daily Activity  Outcome Measure  Difficulty turning over in bed (including adjusting bedclothes, sheets and blankets)?: None Difficulty moving from lying on back to sitting on the side of the bed? : None Difficulty sitting down on and standing up from a chair with arms (e.g., wheelchair, bedside commode, etc,.)?: A Little Help needed moving to and from a bed to chair (including a wheelchair)?: A Little Help needed walking in hospital room?: A Little Help needed climbing 3-5 steps with a railing? : A Lot 6 Click Score: 19    End of Session Equipment Utilized During Treatment: Gait belt Activity Tolerance: Patient tolerated treatment well Patient left: in chair;with call bell/phone within reach;with nursing/sitter in room;with chair alarm set Nurse Communication: Mobility status PT Visit Diagnosis: Other abnormalities of gait and mobility (R26.89);Unsteadiness on feet (R26.81)    Time: 3244-0102 PT Time Calculation (min) (ACUTE ONLY): 27 min   Charges:   PT Evaluation $PT Eval Moderate Complexity: 1 Mod PT Treatments $Therapeutic Activity: 8-22 mins       Mabeline Caras, PT, DPT Acute Rehabilitation Services  Pager [phone] Office [phone]  Derry Lory 05/23/2018, 10:38 AM

## 2018-05-23 NOTE — Evaluation (Signed)
Occupational Therapy Evaluation Patient Details Name: [name] MRN: [id number] DOB: 1956-02-18 Today's Date: 05/23/2018    History of Present Illness Pt is a 62 y.o. female admitted 05/22/18 for AMS; worked up for DKA and UTI. CT head without acute abnormality. PMH includes uncontrolled DM, a-fib, CVA, DDD, HTN, anxiety, chronic opioid use.   Clinical Impression   Pt admitted with the above diagnoses and presents with below problem list. Pt will benefit from continued acute OT to address the below listed deficits and maximize independence with basic ADLs prior to d/c home. PTA pt was mod I with ADLs, sponge bathes per pt report and chart review. Pt currently min to mod A with ADLs and toilet transfers, mostly close min guard with mobility in the room but 2x needing mod A due to LOB. Left lateral lean noted in standing. Pt with decreased awareness. No family present on OT eval. Impaired cognition and flat, pleasant affect observed.      Follow Up Recommendations  SNF;Supervision/Assistance - 24 hour    Equipment Recommendations  Other (comment)(defer to next venue)    Recommendations for Other Services       Precautions / Restrictions Precautions Precautions: Fall Restrictions Weight Bearing Restrictions: No      Mobility Bed Mobility Overal bed mobility: Needs Assistance Bed Mobility: Rolling;Sidelying to Sit;Sit to Supine Rolling: Min assist Sidelying to sit: Min assist   Sit to supine: Min guard   General bed mobility comments: assist 2/2 back pain  Transfers Overall transfer level: Needs assistance Equipment used: Rolling walker (2 wheeled) Transfers: Sit to/from Stand Sit to Stand: Min assist         General transfer comment: min A to steady. Left lateral lean noted especially once ambulating    Balance Overall balance assessment: Needs assistance   Sitting balance-Leahy Scale: Fair       Standing balance-Leahy Scale: Poor Standing balance comment:  External support needed. Pt endorsed dizziness. Pt with left lateral lean. When asked if she felt like she was leaning to the side she replied, "no"                           ADL either performed or assessed with clinical judgement   ADL Overall ADL's : Needs assistance/impaired Eating/Feeding: Set up;Sitting;Minimal assistance   Grooming: Sitting;Moderate assistance   Upper Body Bathing: Sitting;Moderate assistance   Lower Body Bathing: Moderate assistance;Sit to/from stand   Upper Body Dressing : Moderate assistance;Sitting   Lower Body Dressing: Moderate assistance;Sit to/from stand   Toilet Transfer: Moderate assistance;Ambulation;RW   Toileting- Clothing Manipulation and Hygiene: Moderate assistance   Tub/ Shower Transfer: Gaffer;Moderate assistance   Functional mobility during ADLs: Moderate assistance;Rolling walker General ADL Comments: Pt completed bed mobility and in-room functional mobility before requesting to get back in bed, + dizziness.      Vision         Perception     Praxis      Pertinent Vitals/Pain Pain Assessment: Faces Faces Pain Scale: Hurts little more Pain Location: Back Pain Descriptors / Indicators: Sore Pain Intervention(s): Monitored during session;Heat applied     Hand Dominance Right   Extremity/Trunk Assessment Upper Extremity Assessment Upper Extremity Assessment: Generalized weakness   Lower Extremity Assessment Lower Extremity Assessment: Defer to PT evaluation   Cervical / Trunk Assessment Cervical / Trunk Assessment: Other exceptions Cervical / Trunk Exceptions: L lateral flexion (pt reports baseline)   Communication Communication Communication: No  difficulties   Cognition Arousal/Alertness: Awake/alert Behavior During Therapy: Flat affect;WFL for tasks assessed/performed Overall Cognitive Status: No family/caregiver present to determine baseline cognitive functioning Area of Impairment:  Orientation;Attention;Memory;Following commands;Safety/judgement;Awareness;Problem solving                 Orientation Level: Place;Time;Situation Current Attention Level: Sustained Memory: Decreased short-term memory Following Commands: Follows one step commands inconsistently Safety/Judgement: Decreased awareness of deficits Awareness: Intellectual Problem Solving: Slow processing;Decreased initiation;Difficulty sequencing;Requires verbal cues;Requires tactile cues General Comments: Delayed responses noted. Stated year as 89. Flat, pleasant affect. Tactile directional cueing when ambulating in the room. Decreased one-step command following. Short verbal responses.    General Comments  No family present on OT eval    Exercises     Shoulder Instructions      Home Living Family/patient expects to be discharged to:: Private residence Living Arrangements: Spouse/significant other Available Help at Discharge: Family;Available 24 hours/day Type of Home: Mobile home Home Access: Level entry     Home Layout: One level     Bathroom Shower/Tub: Teacher, early years/pre: Standard     Home Equipment: Environmental consultant - 2 wheels;Shower seat;Cane - single point   Additional Comments: Pt reports husband available for 24/7 support. Son can also help      Prior Functioning/Environment Level of Independence: Independent with assistive device(s)        Comments: Pt reports mod indep with RW; indep with ADLs. Pt reports she bathes at the sink.        OT Problem List: Decreased activity tolerance;Impaired balance (sitting and/or standing);Decreased cognition;Decreased safety awareness;Decreased knowledge of use of DME or AE;Decreased knowledge of precautions;Cardiopulmonary status limiting activity;Pain      OT Treatment/Interventions: Self-care/ADL training;DME and/or AE instruction;Manual therapy;Therapeutic activities;Cognitive remediation/compensation;Patient/family  education;Balance training    OT Goals(Current goals can be found in the care plan section) Acute Rehab OT Goals Patient Stated Goal: Return home with help from husband OT Goal Formulation: With patient Time For Goal Achievement: 06/06/18 Potential to Achieve Goals: Good ADL Goals Pt Will Perform Grooming: with modified independence;sitting;standing Pt Will Perform Upper Body Bathing: sitting;with supervision Pt Will Perform Lower Body Bathing: with supervision;sit to/from stand Pt Will Perform Upper Body Dressing: with modified independence;sitting Pt Will Perform Lower Body Dressing: with supervision;sit to/from stand Pt Will Transfer to Toilet: ambulating;with supervision Pt Will Perform Toileting - Clothing Manipulation and hygiene: with supervision;sit to/from stand  OT Frequency: Min 2X/week   Barriers to D/C:            Co-evaluation              AM-PAC PT "6 Clicks" Daily Activity     Outcome Measure Help from another person eating meals?: A Little Help from another person taking care of personal grooming?: A Little Help from another person toileting, which includes using toliet, bedpan, or urinal?: A Lot Help from another person bathing (including washing, rinsing, drying)?: A Lot Help from another person to put on and taking off regular upper body clothing?: A Little Help from another person to put on and taking off regular lower body clothing?: A Little 6 Click Score: 16   End of Session Equipment Utilized During Treatment: Gait belt;Rolling walker  Activity Tolerance: Patient limited by fatigue;Other (comment)(endorsed dizziness) Patient left: in bed;with call bell/phone within reach;with bed alarm set  OT Visit Diagnosis: Other abnormalities of gait and mobility (R26.89);Other symptoms and signs involving cognitive function;Muscle weakness (generalized) (M62.81)  Time: 1245-1300 OT Time Calculation (min): 15 min Charges:  OT General  Charges $OT Visit: 1 Visit OT Evaluation $OT Eval Low Complexity: [place], OT Acute Rehabilitation Services Pager: [phone] Office: [phone]   Hortencia Pilar 05/23/2018, 1:23 PM

## 2018-05-23 NOTE — Progress Notes (Addendum)
  Date: 05/23/2018  Patient name: [name]  Medical record number: [id number]  Date of birth: 31-Jan-1956   I have seen and evaluated [name] and discussed their care with the Residency Team. Briefly, [name] is a pleasant 62yo woman with h/o DM2, Afib, cryptogenic cirrhosis, h/o CVA with L facial droop, NICM, hypothyroidism who presented with DKA due to a UTI.  She was treated for her DKA with an insulin drip and potassium supplementation and her sugars have improved. Sugar this morning was 103 and AG had improved. She was noted to have lower abdominal pain and a UA with 5 Ketones, large leukocytes, rare bacteria, WBC clumps and she was initiated on treatment for a UTI.  This morning, she is more awake and talkative, but still confused about where she is an her recent illness.  She has a pure wick in and reports no further abdominal pain.     Vitals:   05/23/18 0807 05/23/18 1219  BP: 121/76 (!) 110/57  Pulse: 86 72  Resp: 20 (!) 22  Temp: 98.3 F (36.8 C) 98.4 F (36.9 C)  SpO2: 100% 100%   General: Chronically ill appearing, awake, alert but not oriented Eyes: Anicteric sclerae HENT: Neck is supple, MMM CV: RR, irreg, no murmur Pulm: CTAB, no wheezing Abd: Soft, NT, +BS Ext: Trace peripheral edema Neuro: Moving extremities now, reports being up.  She is confused, only answered her name correctly on orientation questions.  Mood is normal.   Assessment and Plan: I have seen and evaluated the patient as outlined above. I agree with the formulated Assessment and Plan as detailed in the residents' note, with the following changes:   1. DKA - Improved, she is off of insulin drip and is on SQ insulin - Confusion improving, will need family to tell us a baseline - Trial of a diet today - Monitor CBG and dose insulin based on this  2. Candida UTI - Treat with fluconazole  3. AKI on CKD  - Improving with treatment of concomitant issues, monitor.   4. Aflutter, fib -  Home eliquis started - Add back rate control when patient recovers  5. Cryptogenic cirrhosis - Given persistent confusion, would add lactulose daily to titrate to 2-3 BM per day and see if this improves.  Other concomitant factors include infection and recent DKA.  Continue rifaximin.   Other issues per resident note today.   Rita Falcon, MD 9/13/201912:30 PM

## 2018-05-24 LAB — BASIC METABOLIC PANEL
Anion gap: 10 (ref 5–15)
BUN: 9 mg/dL (ref 8–23)
CHLORIDE: 104 mmol/L (ref 98–111)
CO2: 22 mmol/L (ref 22–32)
CREATININE: 0.91 mg/dL (ref 0.44–1.00)
Calcium: 8.6 mg/dL — ABNORMAL LOW (ref 8.9–10.3)
GFR calc Af Amer: >60 mL/min (ref >60)
GFR calc non Af Amer: >60 mL/min (ref >60)
GLUCOSE: 176 mg/dL — AB (ref 70–99)
Potassium: 4.5 mmol/L (ref 3.5–5.1)
SODIUM: 136 mmol/L (ref 135–145)

## 2018-05-24 LAB — GLUCOSE, CAPILLARY
GLUCOSE-CAPILLARY: 145 mg/dL — AB (ref 70–99)
Glucose-Capillary: 188 mg/dL — ABNORMAL HIGH (ref 70–99)

## 2018-05-24 NOTE — Progress Notes (Addendum)
   Subjective:  Lying in bed comfortably. No acute events overnight. Reports no pain. Participates appropriately and lucidly in conversation. Her husband plans to see her today to compare her mental status to her baseline.  Objective:  Vital signs in last 24 hours: Vitals:   05/24/18 0401 05/24/18 0419 05/24/18 0425 05/24/18 0806  BP:  (!) 155/84    Pulse:  78    Resp:  (!) 21    Temp: 98 F (36.7 C)     TempSrc: Oral     SpO2:  99%  100%  Weight:   63.8 kg   Height:       Weight change:   Intake/Output Summary (Last 24 hours) at 05/24/2018 0816 Last data filed at 05/24/2018 0401 Gross per 24 hour  Intake 340 ml  Output 750 ml  Net -410 ml   General: Resting in bed. No acute distress. Cardio: RRR, no m/r/g. 2+ bilateral radial pulses. Pulmonary: Clear breath sounds in all lung fields.  Extremities: Warm and well perfused. No erythema or edema. Neuro: Alert and oriented to person and place. Knows she is at [facility] in [place] an is aware of current events. Says the year is 54 but understands it is September and is able to state that it is 2019 once reminded.   Assessment/Plan:  Principal Problem:   Acute encephalopathy Active Problems:   Uncontrolled type 2 diabetes mellitus (HCC)   Cryptogenic cirrhosis (HCC)   Diabetic ketoacidosis (HCC)   UTI (urinary tract infection)  This is a 62 year-old female with extensive medical history including uncontrolled type 2 diabetes, atrial fibrillation, cryptogenic cirrhosis, history of CVA with residual L-facial droop, history of NICM, chronic pain w/ secondary long term prescription opioid use and drug-induced hypothyroidismwho presented to the ED by EMS at request of her husband for AMS and was found to be in moderate DKA. Her DKA has resolved and her mental status continues to improve daily. She appears medically stable for discharge today pending final evaluation and assessment of her baseline mental status.   Diabetic  Ketoacidosis Uncontrolled Type 2 Diabetes: CBG this morning 145. Tolerating PO intake. -CBG q4 hours -continue home insulin regimen  Hyperkalemia, Hypomagnesemia: K wnl this morning at 4.5, and Mg wnl yesterday at 2.0.   UTI: Unable to restart diflucan yesterday, not available in pharmacy. No continued urinary symptoms, pt will resume home diflucan on d/c.   Acute on Chronic Kidney Disease: Cr 0.91 this morning down from 1.12 yesterday.  -Continue holding Lisinopril, Spiro and Lasix.   Cirrhosis: No acute concern for hepatic encephalopathy in context of currently improving mental status.  Dispo: Anticipated discharge in approximately 0-1 day(s). PT recommends 24-hr supervision/assistance and pt declines SNF placement. Referral made for home health services.    LOS: 2 days   Aleen Campi, Medical Student 05/24/2018, 8:16 AM   Attestation for Student Documentation:  I personally was present and performed or re-performed the history, physical exam and medical decision-making activities of this service and have verified that the service and findings are accurately documented in the student's note. It appears at this time that her the acute encephalopathy was most likely 2/2 mild/moderate DKA based on the resolution of her symptoms concurrent with the resolution of her DKA. She appears to be stable for discharge home today.   Kathi Ludwig, MD 05/24/2018, 9:53 AM

## 2018-05-24 NOTE — Plan of Care (Signed)
  Problem: Education: Goal: Knowledge of General Education information will improve Description Including pain rating scale, medication(s)/side effects and non-pharmacologic comfort measures Outcome: Completed/Met   Problem: Health Behavior/Discharge Planning: Goal: Ability to manage health-related needs will improve Outcome: Completed/Met   Problem: Elimination: Goal: Will not experience complications related to bowel motility Outcome: Completed/Met   Problem: Skin Integrity: Goal: Risk for impaired skin integrity will decrease Outcome: Completed/Met

## 2018-05-24 NOTE — Discharge Summary (Addendum)
Name: [name] MRN: [id number] DOB: September 04, 1956 62 y.o. PCP: Patient, No Pcp Per  Date of Admission: 05/22/2018  6:30 AM Date of Discharge: 05/24/2018 Attending Physician: Rita Falcon, MD  Discharge Diagnosis: 1. Acute metabolic encephalopathy secondary to uncontrolled type II diabetes mellitus and now resolved diabetic ketoacidosis.  2. Chronic candiduria  Discharge Medications: No current facility-administered medications on file prior to encounter.    Current Outpatient Medications on File Prior to Encounter  Medication Sig Dispense Refill  . cyclobenzaprine (FLEXERIL) 10 MG tablet Take 10 mg by mouth 3 (three) times daily as needed for muscle spasms.     Marland Kitchen oxyCODONE ER (XTAMPZA ER) 9 MG C12A Take 9 mg by mouth every 8 (eight) hours.    Marland Kitchen albuterol (PROVENTIL HFA;VENTOLIN HFA) 108 (90 Base) MCG/ACT inhaler Inhale 2 puffs into the lungs every 6 (six) hours as needed for wheezing or shortness of breath. 1 Inhaler 0  . apixaban (ELIQUIS) 5 MG TABS tablet Take 1 tablet (5 mg total) by mouth 2 (two) times daily. 60 tablet 0  . atorvastatin (LIPITOR) 40 MG tablet Take 1 tablet (40 mg total) by mouth at bedtime. 30 tablet 0  . budesonide-formoterol (SYMBICORT) 160-4.5 MCG/ACT inhaler Inhale 2 puffs into the lungs 2 (two) times daily. 1 Inhaler 0  . fluconazole (DIFLUCAN) 200 MG tablet Take 1 tablet (200 mg total) by mouth daily. 14 tablet 0  . furosemide (LASIX) 40 MG tablet Take 1 tablet (40 mg total) by mouth daily. 30 tablet 1  . insulin aspart (NOVOLOG FLEXPEN) 100 UNIT/ML FlexPen Inject 5 units into the skin with meals for blood sugars above 130. 3 mL 1  . Insulin Detemir (LEVEMIR FLEXPEN) 100 UNIT/ML Pen Inject 12 Units into the skin every evening. 3 mL 1  . levothyroxine (SYNTHROID, LEVOTHROID) 75 MCG tablet Take 1 tablet (75 mcg total) by mouth daily before breakfast. 30 tablet 0  . lisinopril (PRINIVIL,ZESTRIL) 5 MG tablet Take 1 tablet by mouth every morning for kidney  protection against diabetes. 30 tablet 0  . metoprolol tartrate (LOPRESSOR) 25 MG tablet Take 1 tablet (25 mg total) by mouth 2 (two) times daily. 60 tablet 0  . potassium chloride 20 MEQ TBCR Take 20 mEq by mouth daily. 30 tablet 1  . rifaximin (XIFAXAN) 550 MG TABS tablet Take 1 tablet (550 mg total) by mouth 2 (two) times daily. 60 tablet 1  . spironolactone (ALDACTONE) 100 MG tablet Take 1 tablet (100 mg total) by mouth daily. 30 tablet 1    Disposition and follow-up:   Ms.[name] was discharged from [facility] in Stable condition.  At the hospital follow up visit please address:  1. Home insulin regimen, consistency of dosing. She may be a candidate for continuous glucose monitoring trial to better adjust her insulin and discuss dietary modifications.   Follow-up Appointments: Follow-up Information    [facility] Follow [facility].   Specialty:  Home Health Services Why:  home health services arranged Contact information: [address] [place] [address] [phone]           Hospital Course by problem list: 62 year-old female with extensive medical history including uncontrolled type 2 diabetes, atrial fibrillation, cryptogenic cirrhosis, history of CVA with residual L-facial droop, history of NICM, chronic prescription opioid use due to chronic pain and drug-induced hypothyroidism who presented to ED by EMS at request of her husband for acute encephalopathy found to be in mild/moderate symptomatic  diabetic ketoacidosis (DKA).  1. Diabetic Ketoacidosis-Uncontrolled T2DM: Evidence of mild/moderate DKA on admission with elevated betahydroxybutyric acid, serum glucose, urinary ketones, an anion gap metabolic acidosis, dehydration, mildly increased creatinine, with alteration of her baseline mental status. This resolved with treatment by day with the anion gap of 10, blood glucose of 123. Serial CBG checks were within normal limits  following the discontinuation of the insulin drip. The acute encephalopathy was considered most likely secondary to DKA and as per husband, was fully resolved by discharge. Day 1 of admission not oriented to person, place or time, day 2 oriented to person but not to place or time, and day 3 oriented to person, place and time.   2. Urinary Tract Infection: Day 1 urinary analysis demonstrated equivocal evidence of a urinary tract infection. Empiric ceftriaxone was given until day 2 when UCx demonstrated yeast. Patient denied urinary symptoms during her stay and was discharged with instructions to resume her prophylactic home fluconazole.   3. Cryptogenic Cirrhosis:  Ammonia 48 on admission, possible partial contributor to AMS but no direct correlation established. 1 dose of PO lactulose given in ED, managed with home Rifamixin for the remainder of admission.  4. Chronic Atrial Fibrillation: Managed with home Metoprolol and Eliquis. HR and BP stable throughout admission.   5. Chronic Pain Syndrome: Resumed Oxycodone 5 mg q8 hours PRN cautiously on day 1 of admission to avoid withdrawal in the context of AMS.  6. Hypothyroidism TSH wnl on admission, continued home regimen  7. COPD Managed with Breo-Ellipta throughout admission. Satted well on room air throughout admission.  8. Acute on Chronic Kidney Disease Considered likely acute renal injury secondary to dehydration in the context of DKA. Cr 2.18 and GFR 23 on admission, resolved with Cr at 0.91, GFR at >60 by day of discharge. Home spironolactone and lasix were held throughout her stay due to concern for AKI caused by dehydration.  Discharge Vitals:   BP (!) 155/84   Pulse 78   Temp 98.3 F (36.8 C) (Oral)   Resp (!) 21   Ht 5' 3"  (1.6 m)   Wt 63.8 kg   SpO2 100%   BMI 24.92 kg/m   CBC Latest Ref Rng & Units 05/23/2018 05/22/2018 05/22/2018  WBC 4.0 - 10.5 K/uL 8.4 - 7.3  Hemoglobin 12.0 - 15.0 g/dL 10.4(L) 10.2(L) 9.5(L)    Hematocrit 36.0 - 46.0 % 34.1(L) 30.0(L) 31.1(L)  Platelets 150 - 400 K/uL 172 - 168   CMP Latest Ref Rng & Units 05/24/2018 05/23/2018 05/23/2018  Glucose 70 - 99 mg/dL 176(H) 106(H) 123(H)  BUN 8 - 23 mg/dL 9 12 14   Creatinine 0.44 - 1.00 mg/dL 0.91 1.09(H) 1.12(H)  Sodium 135 - 145 mmol/L 136 136 137  Potassium 3.5 - 5.1 mmol/L 4.5 4.5 5.3(H)  Chloride 98 - 111 mmol/L 104 110 108  CO2 22 - 32 mmol/L 22 18(L) 19(L)  Calcium 8.9 - 10.3 mg/dL 8.6(L) 8.4(L) 8.5(L)  Total Protein 6.5 - 8.1 g/dL - - -  Total Bilirubin 0.3 - 1.2 mg/dL - - -  Alkaline Phos 38 - 126 U/L - - -  AST 15 - 41 U/L - - -  ALT 0 - 44 U/L - - -   Pertinent Labs, Studies, and Procedures:  On admission: hyperglycemia (349 on BMP), high anion-gap metabolic acidosis (gap 14, 17 with correction for albumin) and elevated Beta-hydroxybutyric acid at 2.6.  K of 3.2, Creatinine 2.18, GFR 23, albumin 2.7.  UA with glucosuria and  ketones, large leukocytes, 11-20 rbc/hpf, and rare bacteria.  Urine culture showed >100k colonies of yeast  PT-INR 16.1 Ammonia 48  Discharge Instructions: Discharge Instructions    Call MD for:  persistant dizziness or light-headedness   Complete by:  As directed    Call MD for:  persistant nausea and vomiting   Complete by:  As directed    Call MD for:  redness, tenderness, or signs of infection (pain, swelling, redness, odor or green/yellow discharge around incision site)   Complete by:  As directed    Diet - low sodium heart healthy   Complete by:  As directed    Discharge instructions   Complete by:  As directed    Please resume your home medications. Please call your primary care doctor and discuss your symptoms that lead to this admission.  If you begin to develop confusion or other concerning symptoms again please notify your doctor or visit the local emergency department.  Thank you for your visit to the [facility].   Increase activity slowly   Complete by:  As  directed       Signed: Kathi Ludwig, MD 05/24/2018, 2:51 PM   Pager: @[phone]

## 2018-05-27 LAB — CULTURE, BLOOD (ROUTINE X 2)
CULTURE: NO GROWTH
Culture: NO GROWTH

## 2018-05-29 ENCOUNTER — Telehealth: Payer: Self-pay

## 2018-05-29 NOTE — Telephone Encounter (Signed)
Copied from [place] [phone]. Topic: Inquiry >> May 29, 2018  3:54 PM [name] wrote: Reason for CRM: [name] from [facility] called to see if [name] would sign orders for the pt for home health aid; contact [phone]

## 2018-05-29 NOTE — Telephone Encounter (Signed)
Pt saw pt for HF 09/17/16. No future appts scheduled. ? Who is PCP.Please advise.

## 2018-05-30 NOTE — Telephone Encounter (Signed)
Gave the approval of the verbal ordesr to [place] and he will inform patient of [name] comments.

## 2018-05-30 NOTE — Telephone Encounter (Signed)
I am technically her PCP but she is non compliant and has no showed and cancelled appointments in the past. I will agree to approve her home health aid but she'll need to be scheduled for a 40 minute office visit for hospital follow up. I've not seen her in over one year.

## 2018-06-02 IMAGING — CR DG CHEST 2V
2 series · 2 of 2 positions shown · non-contrast
Comparison: 01/28/2016

CLINICAL DATA: Upper abdominal pain for 3 or 4 days. Mid left chest
pain since yesterday. Dyspnea onset today.

EXAM:
CHEST  2 VIEW

[chest pa]
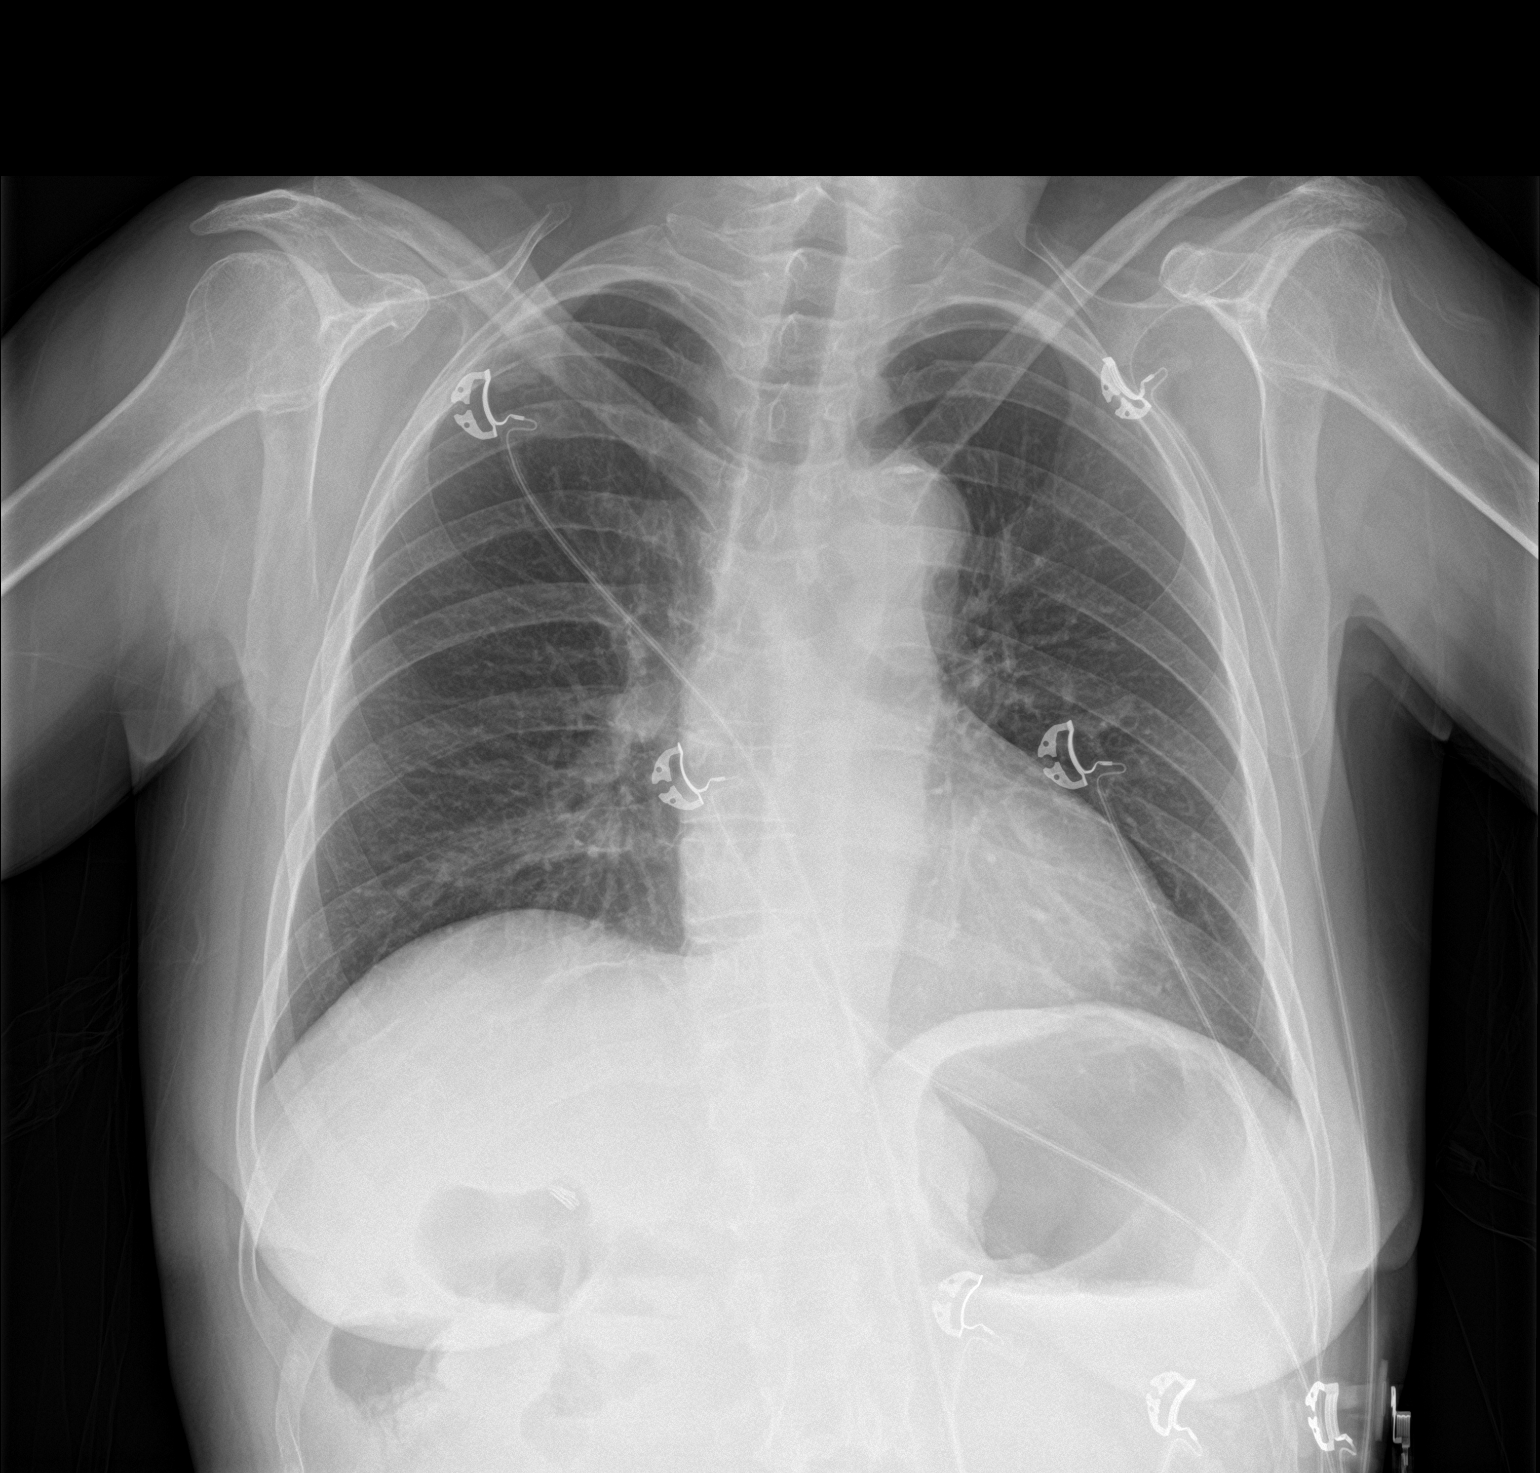

[chest lat]
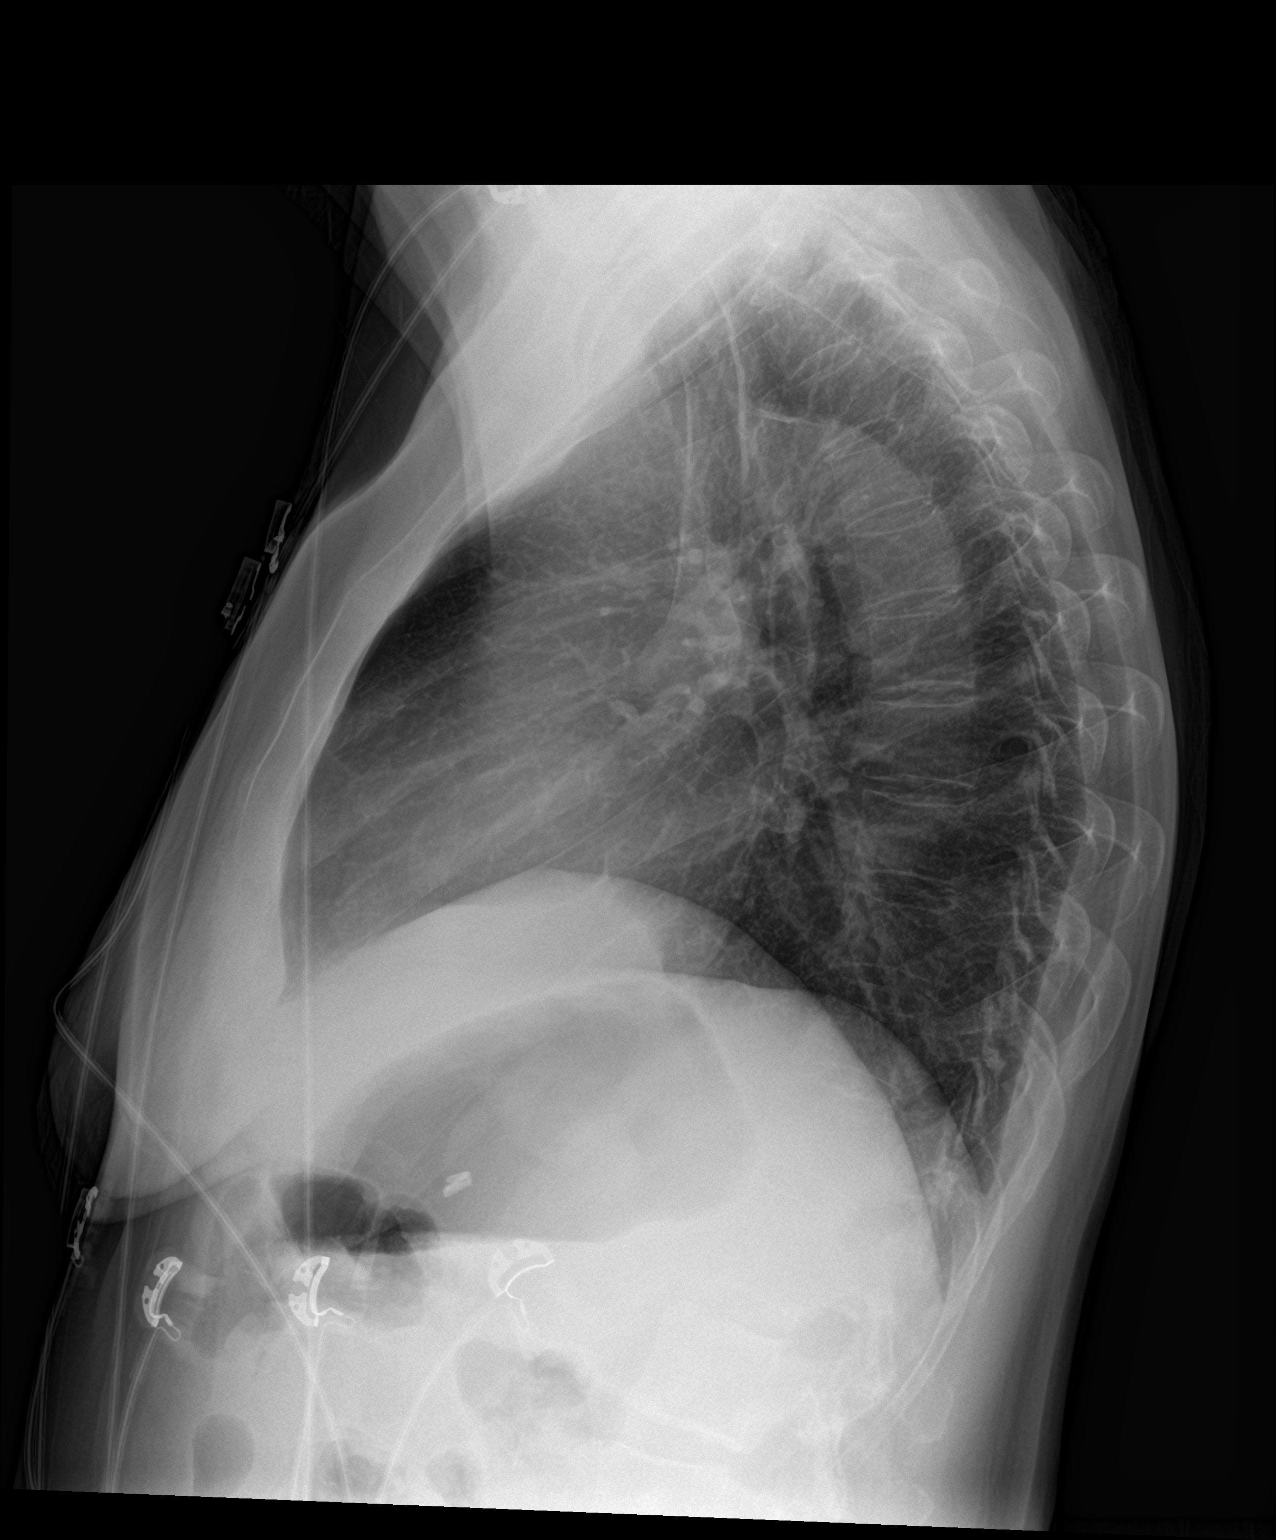

[2 of 2 positions shown; findings below may reference images not displayed]

FINDINGS: The lungs are clear. The pulmonary vasculature is normal. Heart size
is normal. Hilar and mediastinal contours are unremarkable. There is
no pleural effusion.
IMPRESSION: No active cardiopulmonary disease.

## 2018-06-06 IMAGING — CT CT ABD-PELV W/ CM
2 of 5 series · 15 of 46 positions shown, 17 images · IV contrast (APPLIED)
Comparison: 03/13/2016

CLINICAL DATA: Upper abdominal pain, nausea and vomiting.
Hematuria. Four days duration. No improvement with antibiotic
started 2 days ago.

EXAM:
CT ABDOMEN AND PELVIS WITH CONTRAST
TECHNIQUE: Multidetector CT imaging of the abdomen and pelvis was performed
using the standard protocol following bolus administration of
intravenous contrast.
CONTRAST:  100mL 4SZ24O-NLL IOPAMIDOL (4SZ24O-NLL) INJECTION 61%

[Series 2: axial st · axial · 0.70mm/px · z∈[-954,-519]mm · 12 of 99 slices shown, 14 images]
[im 6/99  soft-tissue]
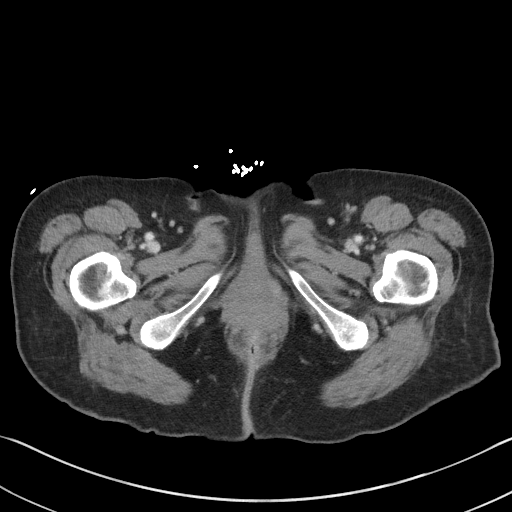
[im 6/99  bone]
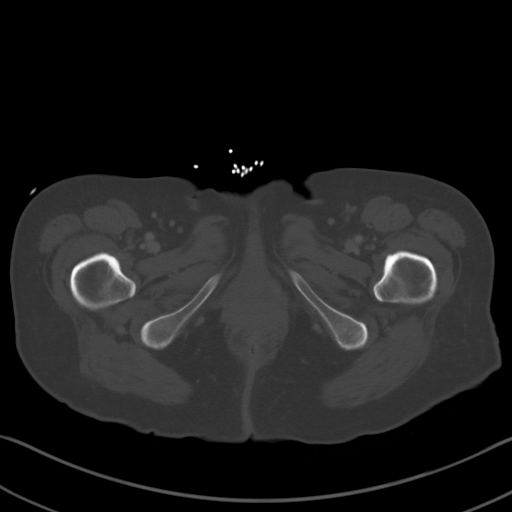
[im 16/99  soft-tissue]
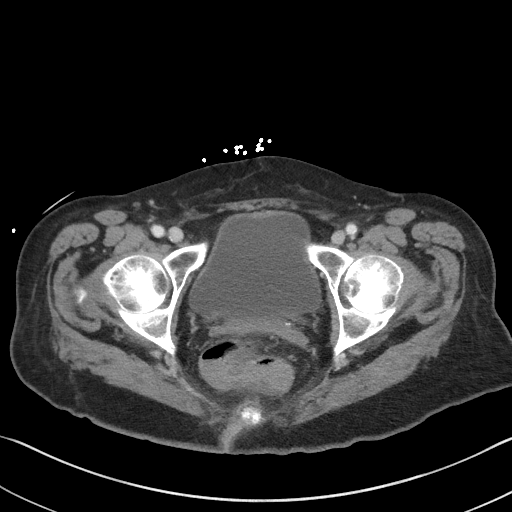
[im 21/99  soft-tissue]
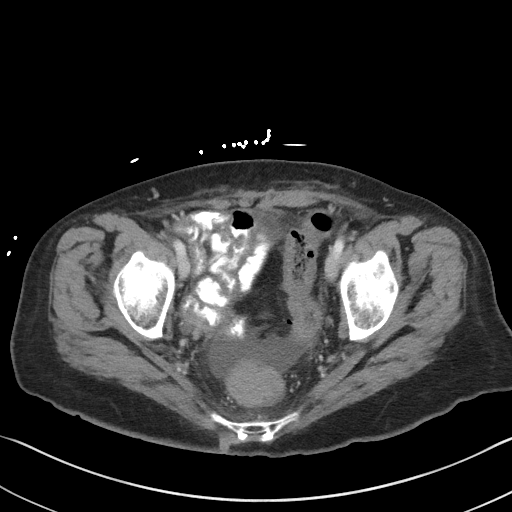
[im 31/99  soft-tissue]
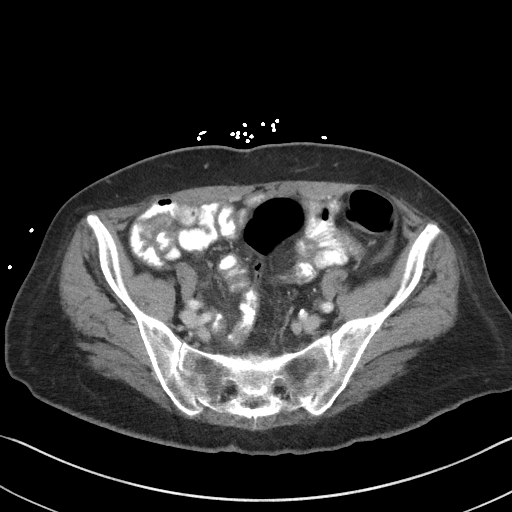
[im 37/99  soft-tissue]
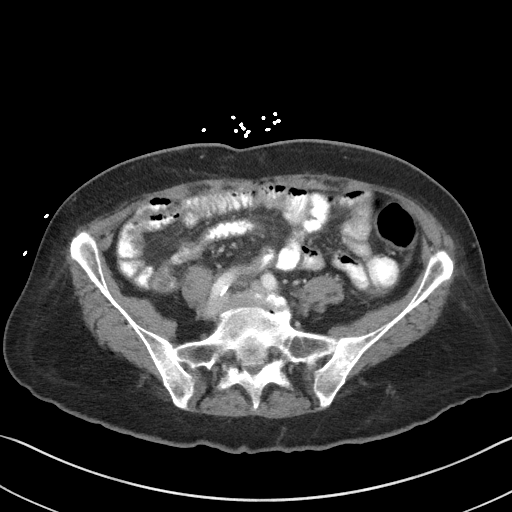
[im 47/99  soft-tissue]
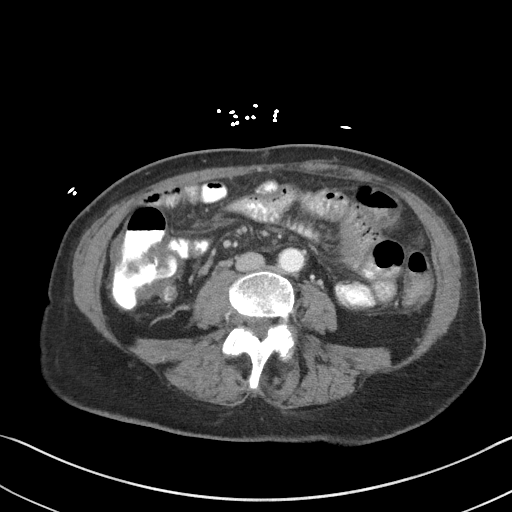
[im 52/99  soft-tissue]
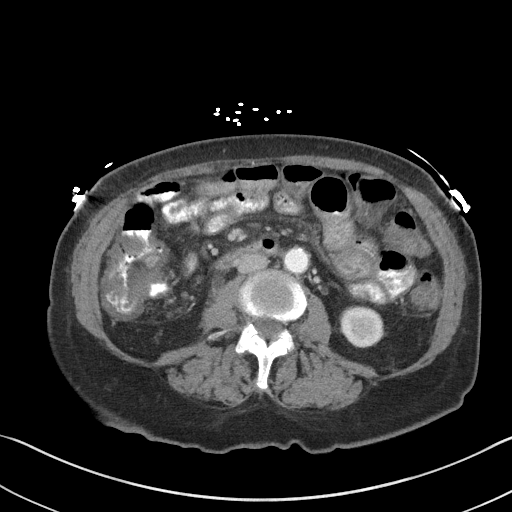
[im 62/99  soft-tissue]
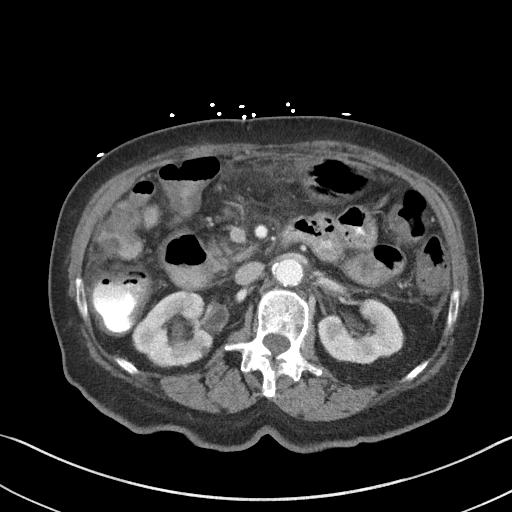
[im 68/99  soft-tissue]
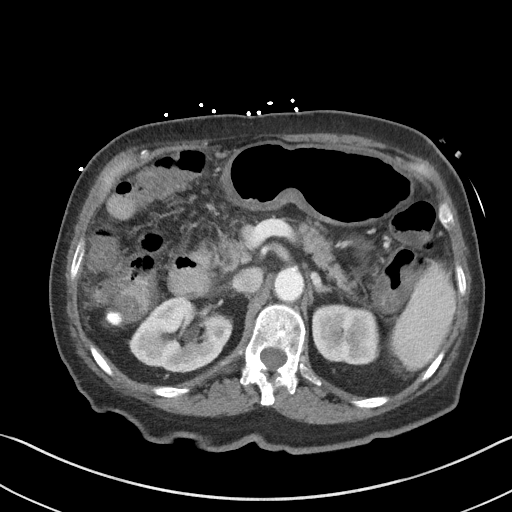
[im 68/99  bone]
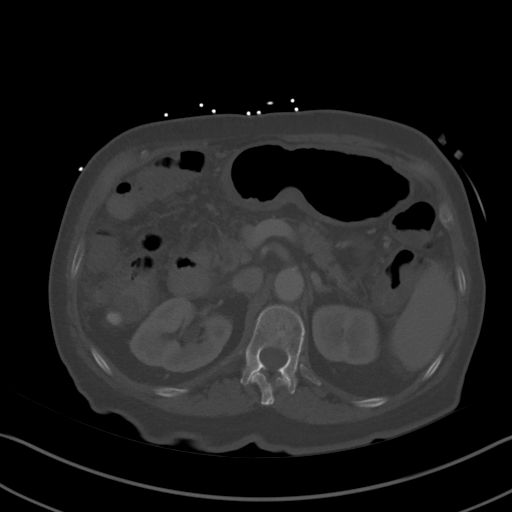
[im 78/99  soft-tissue]
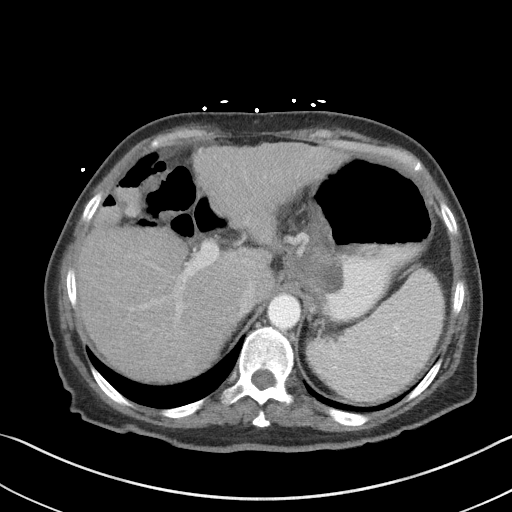
[im 83/99  soft-tissue]
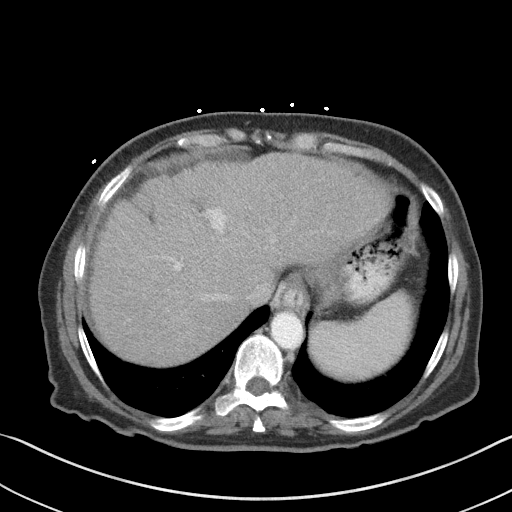
[im 93/99  soft-tissue]
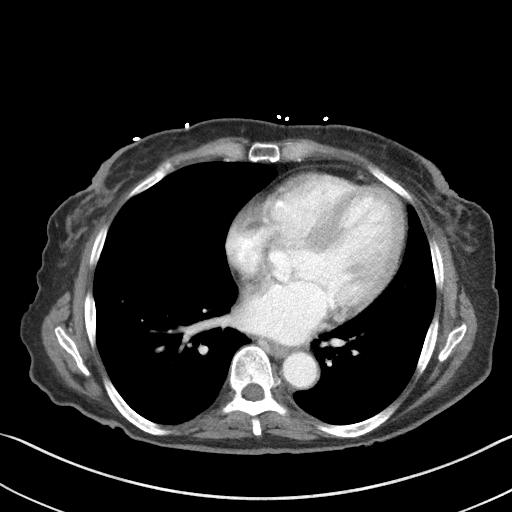

[Series 5: coronal st · coronal · 0.68mm/px · 3 of 79 slices shown]
[im 27/79  soft-tissue]
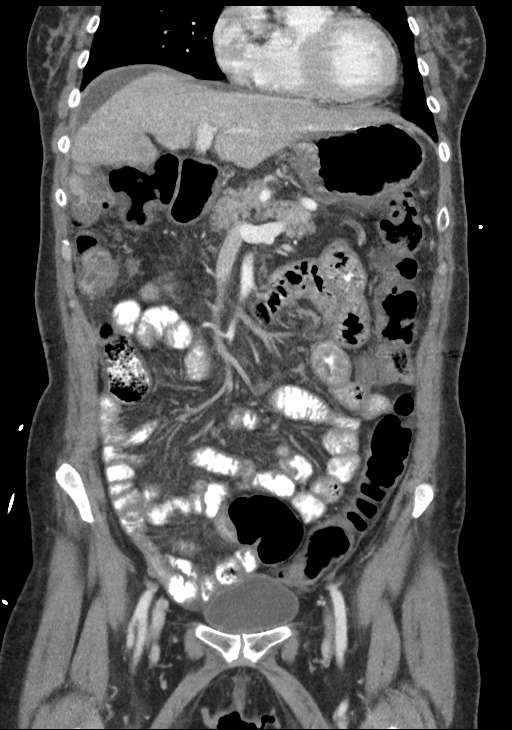
[im 35/79  soft-tissue]
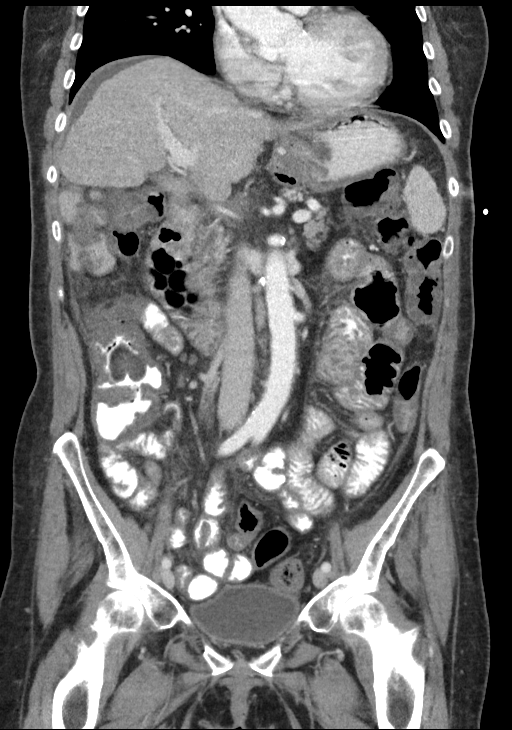
[im 44/79  soft-tissue]
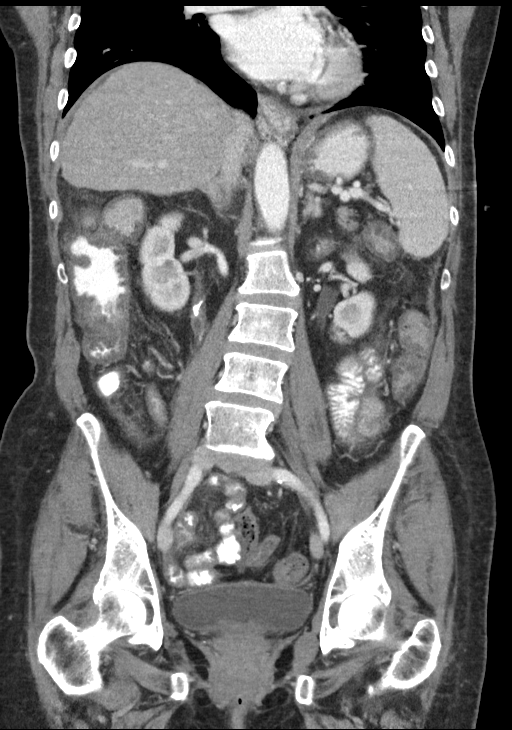

[15 of 46 positions shown; findings below may reference images not displayed]

FINDINGS: Lower chest: No acute abnormality.

Hepatobiliary: Cirrhotic liver without focal lesion. Portal vein is
patent. Evidence of portal hypertension, with prominent varices of
the distal esophagus and upper abdomen. Prior cholecystectomy.
Normal bile ducts.

Pancreas: Unremarkable. No pancreatic ductal dilatation or
surrounding inflammatory changes.

Spleen: Normal in size without focal abnormality.

Adrenals/Urinary Tract: Obstructing right ureteropelvic junction
calculus measuring 5 x 8 x 10 mm, previously located in the lower
pole collecting system on 03/13/16. Mural enhancement of the right
ureter and right collecting system, nonspecific but worrisome for
upper tract infection.

At least 2 collecting system calculi of the left midpole on left
lower pole, 2-3 mm each.

No significant renal parenchymal lesions. Urinary bladder is
unremarkable.

Stomach/Bowel: Distal esophageal varices. Stomach, small bowel and
colon are unremarkable.

Vascular/Lymphatic: The abdominal aorta is normal in caliber with
mild atherosclerotic calcification. No adenopathy is evident in the
abdomen or pelvis.

Reproductive: Hysterectomy.  No adnexal abnormalities.

Other: Small volume ascites, decreased from 03/13/2016

Musculoskeletal: No acute or significant osseous findings.
IMPRESSION: 1. Obstructing right UPJ calculus measuring 5 x 8 x 10 mm.
Nonspecific mural enhancement of the right ureter right collecting
system, sometimes seen with upper tract infection.
2. Left nephrolithiasis.
3. Cirrhosis with small volume ascites. Upper abdominal and distal
esophageal varices.

## 2018-06-16 IMAGING — US US PARACENTESIS
1 series · 10 of 10 positions shown · non-contrast
Comparison: none

INDICATION: 60-year-old female with a history of recurrent ascites

[Series 1: us paracentesis · 0.23mm/px · 10 of 10 slices shown]
[im 1/10]
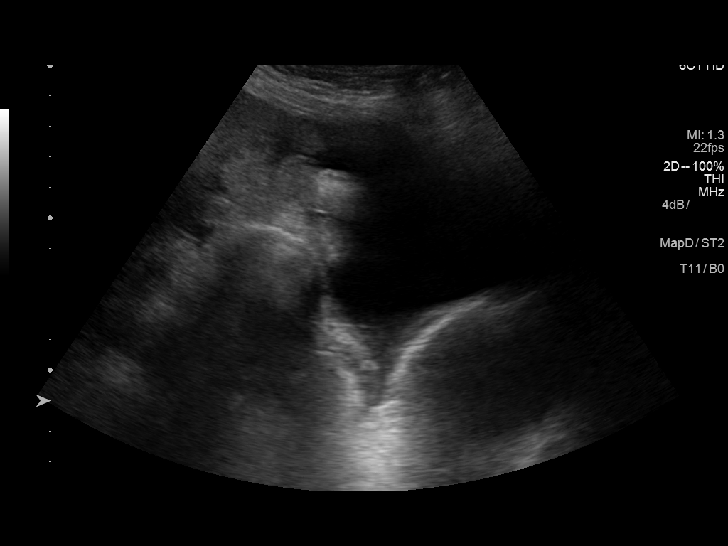
[im 2/10]
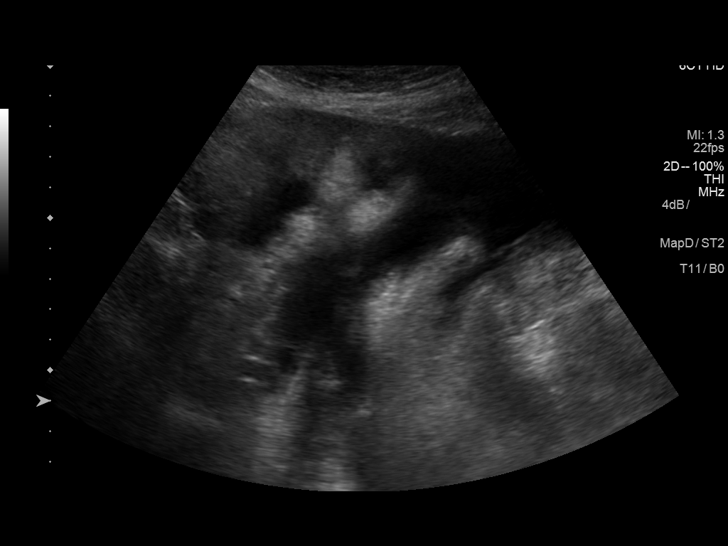
[im 3/10]
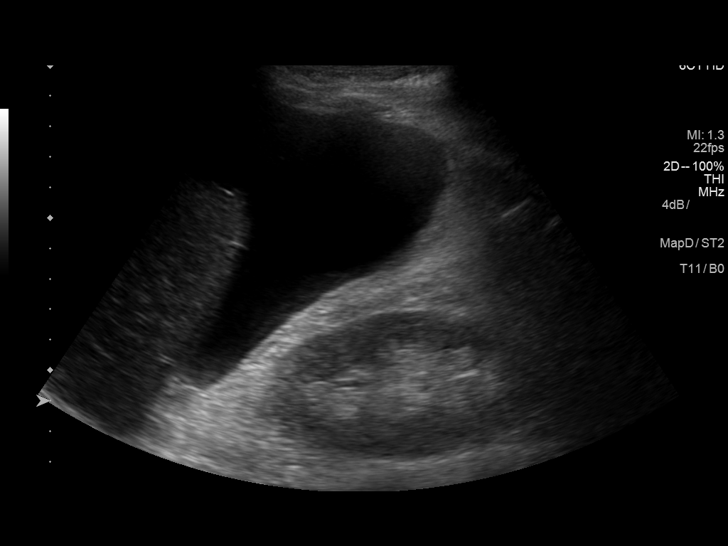
[im 4/10]
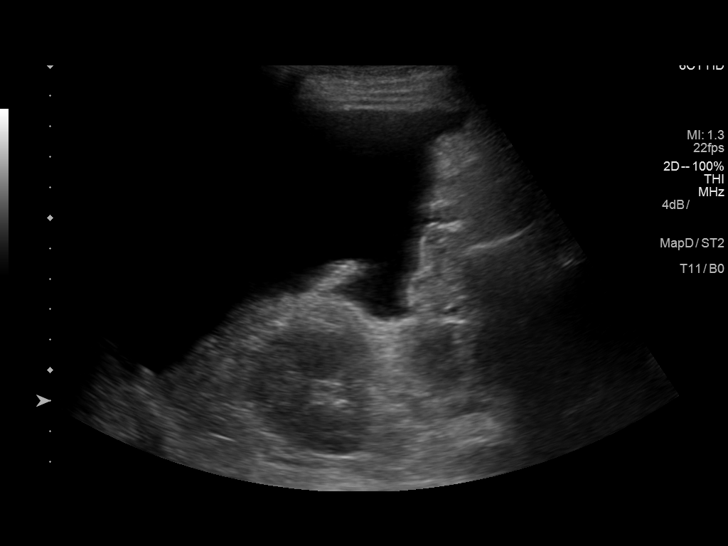
[im 5/10]
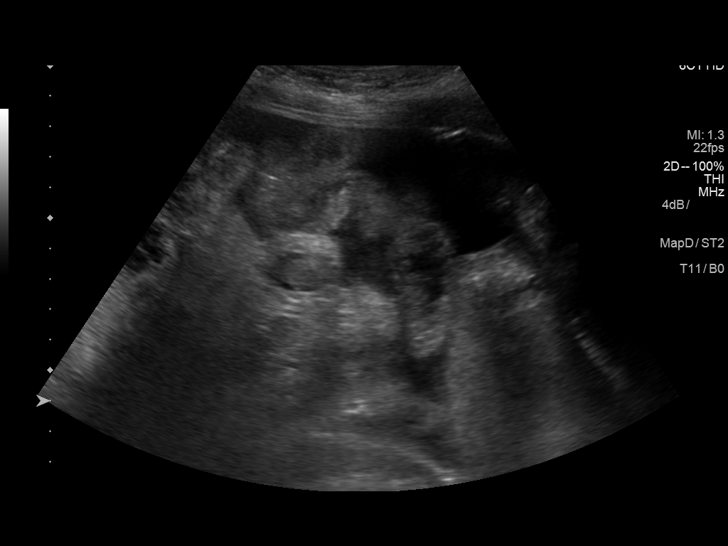
[im 6/10]
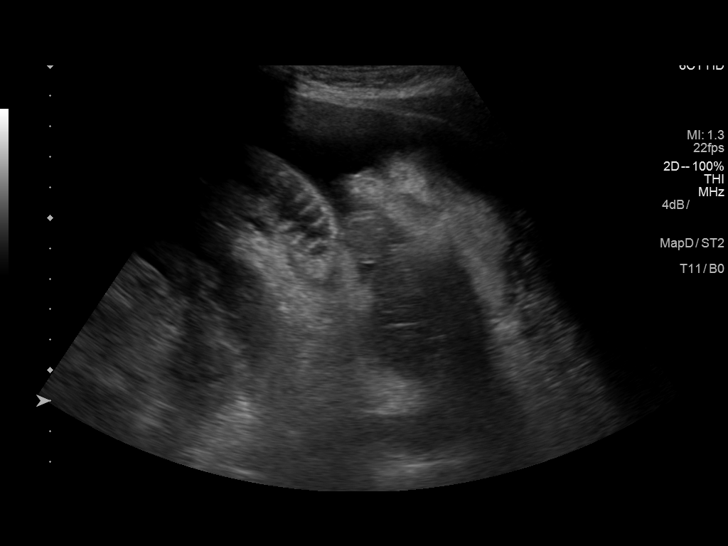
[im 7/10]
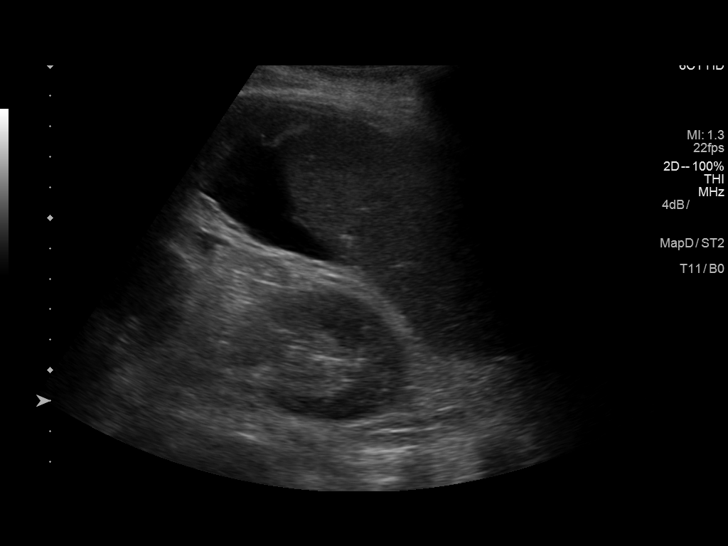
[im 8/10]
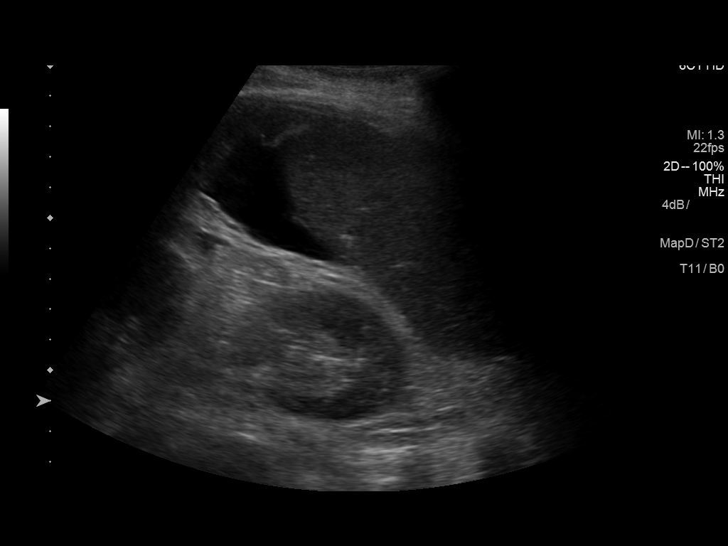
[im 9/10]
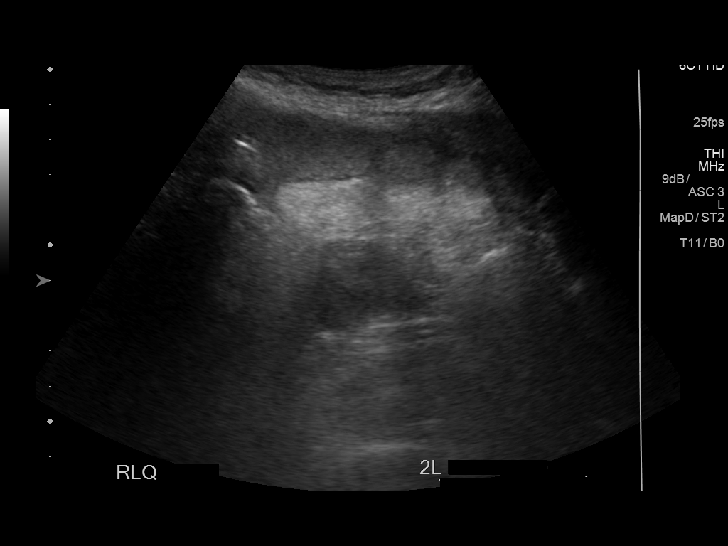
[im 10/10]
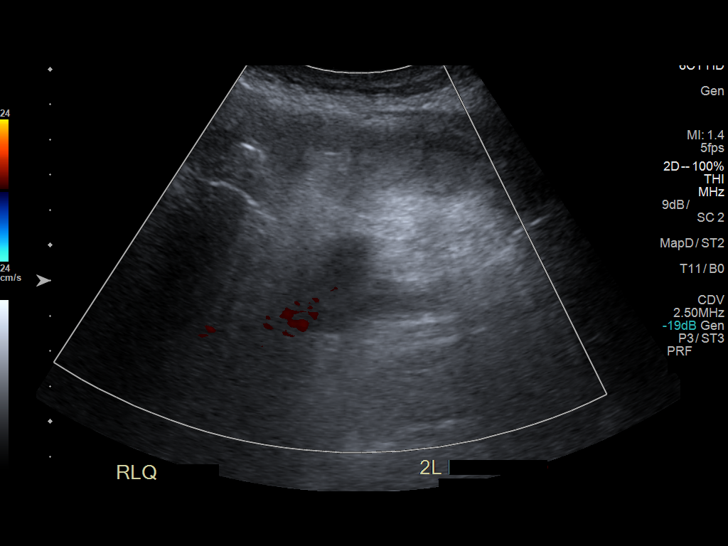

[10 of 10 positions shown; findings below may reference images not displayed]

EXAM:
ULTRASOUND GUIDED  PARACENTESIS

MEDICATIONS:
None.

COMPLICATIONS:
None

PROCEDURE:
Informed written consent was obtained from the patient after a
discussion of the risks, benefits and alternatives to treatment. A
timeout was performed prior to the initiation of the procedure.

Initial ultrasound scanning demonstrates a moderate amount of
ascites within the right lower abdominal quadrant. The right lower
abdomen was prepped and draped in the usual sterile fashion. 1%
lidocaine with epinephrine was used for local anesthesia.

Following this, a Safe-T-Centesis catheter was introduced. An
ultrasound image was saved for documentation purposes. The
paracentesis was performed. The catheter was removed and a dressing
was applied. The patient tolerated the procedure well without
immediate post procedural complication.
FINDINGS: A total of approximately 2.0 L of thin yellow fluid was removed.
IMPRESSION: Status post ultrasound-guided paracentesis with 2.0 L of thin yellow
fluid removed.

## 2018-06-30 IMAGING — DX DG CHEST 1V
1 series · 1 of 1 positions shown · non-contrast
Comparison: 07/09/2016.

CLINICAL DATA: Abdominal pain.

EXAM:
CHEST 1 VIEW

[chest ap]
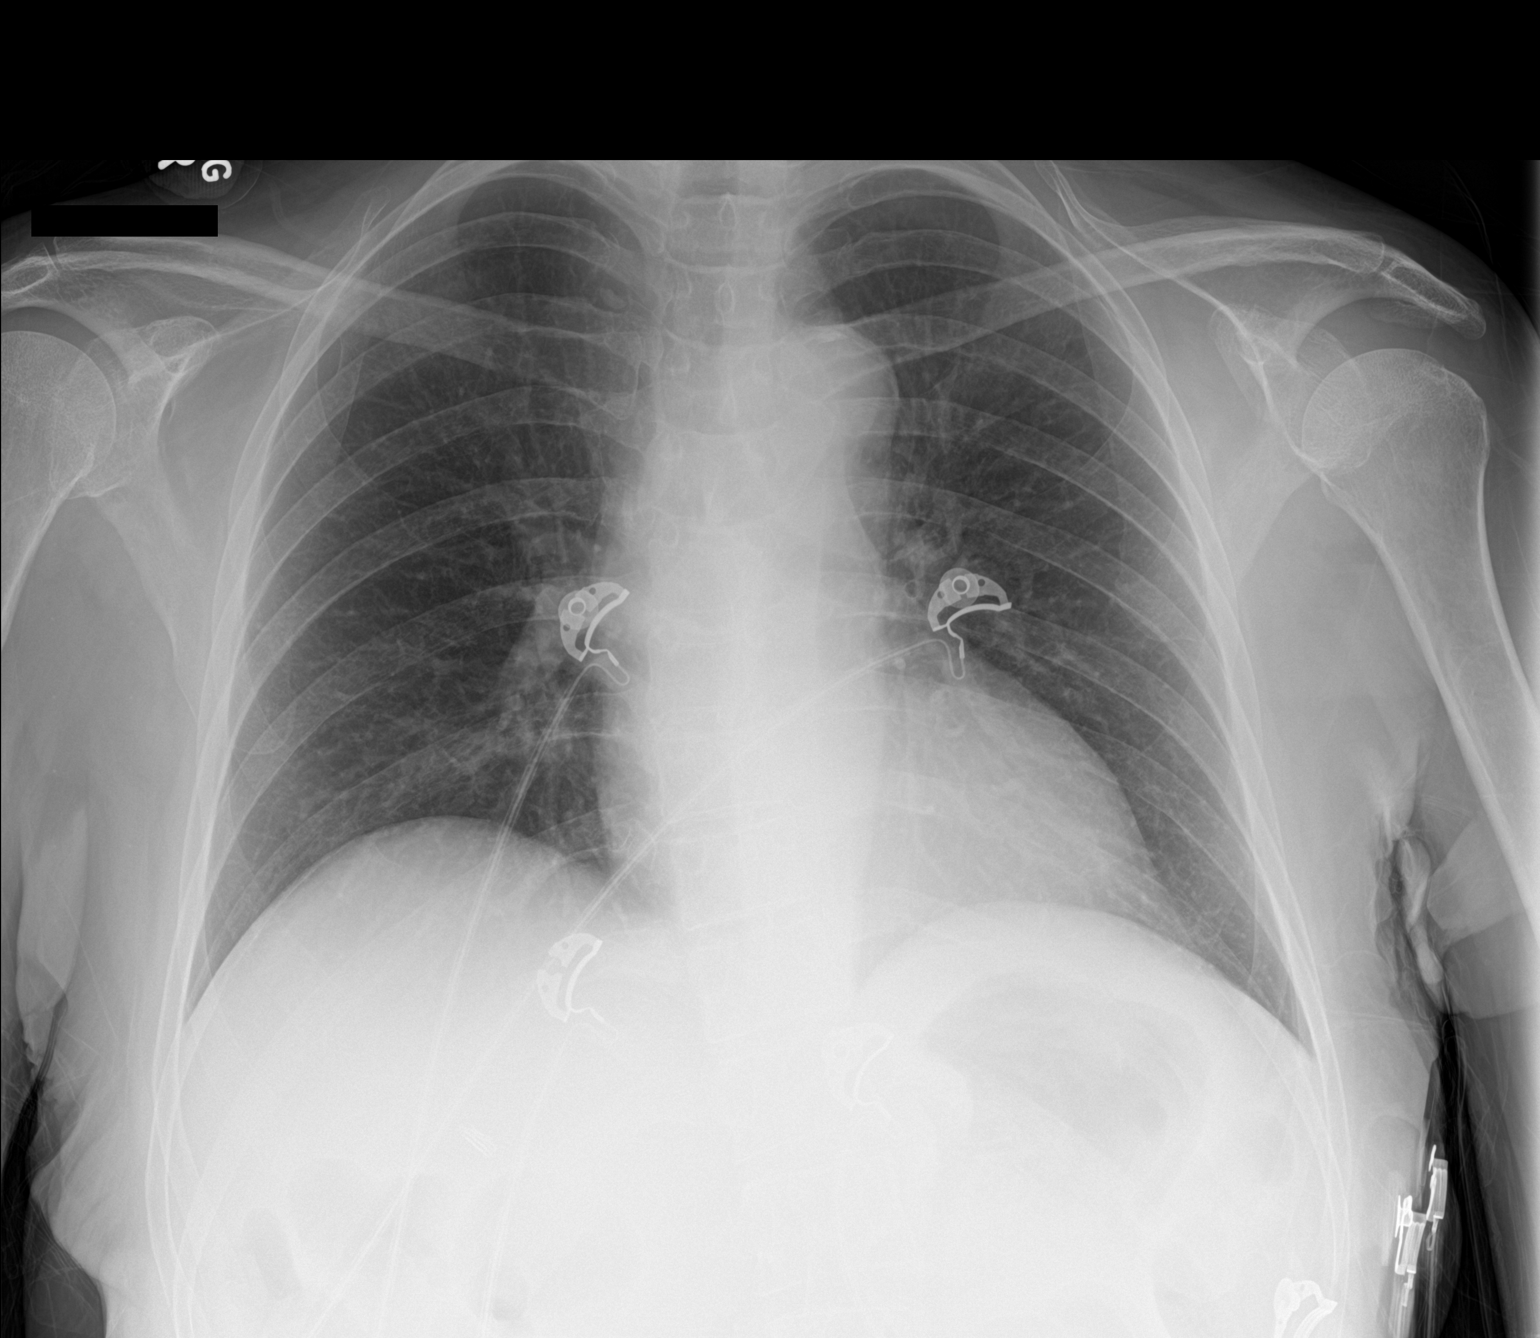

[1 of 1 positions shown; findings below may reference images not displayed]

FINDINGS: Mediastinum hilar structures normal. Lungs are clear. Cardiomegaly
with normal pulmonary vascularity.
IMPRESSION: 1. Cardiomegaly.  No evidence of pulmonary venous congestion.

2. No acute pulmonary disease.

## 2018-06-30 IMAGING — CT CT RENAL STONE PROTOCOL
2 of 4 series · 16 of 46 positions shown, 18 images · non-contrast
Comparison: 07/13/2016

CLINICAL DATA: C/o abd pain with n/v x 5 days, hx of
cholecystectomy and hysterectomy.

EXAM:
CT ABDOMEN AND PELVIS WITHOUT CONTRAST
TECHNIQUE: Multidetector CT imaging of the abdomen and pelvis was performed
following the standard protocol without IV contrast.

[Series 2: axial st · axial · 0.69mm/px · z∈[-441,-1]mm · 13 of 96 slices shown, 15 images]
[im 4/96  soft-tissue]
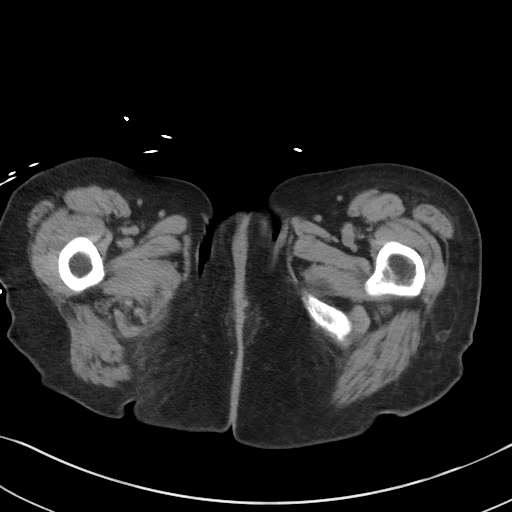
[im 4/96  bone]
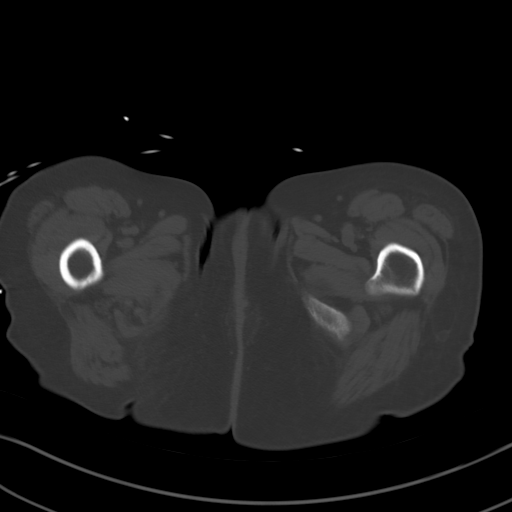
[im 12/96  soft-tissue]
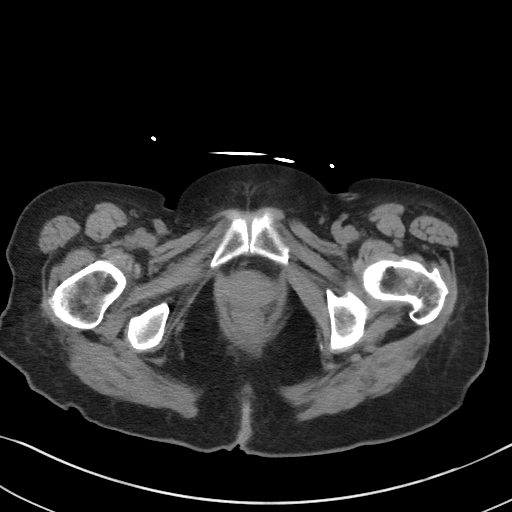
[im 20/96  soft-tissue]
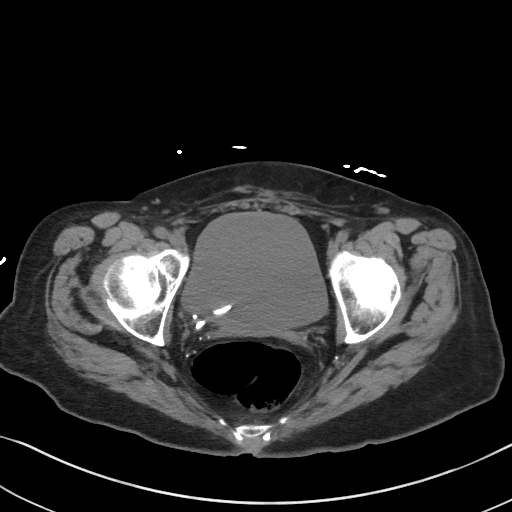
[im 28/96  soft-tissue]
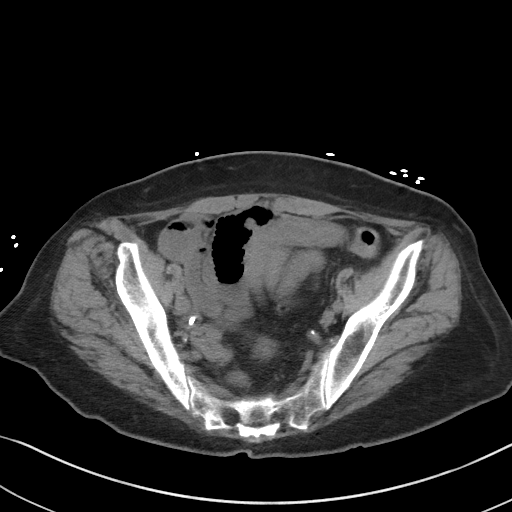
[im 32/96  soft-tissue]
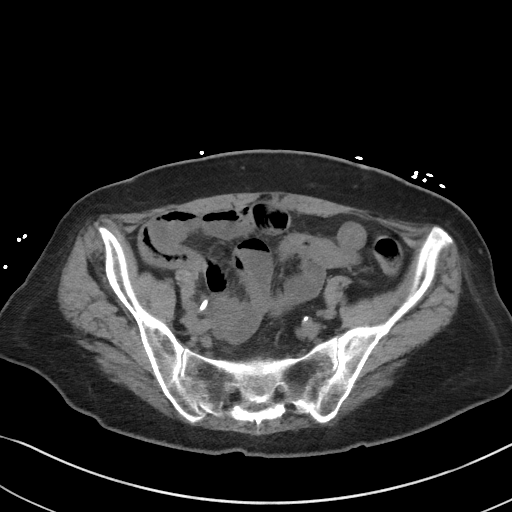
[im 40/96  soft-tissue]
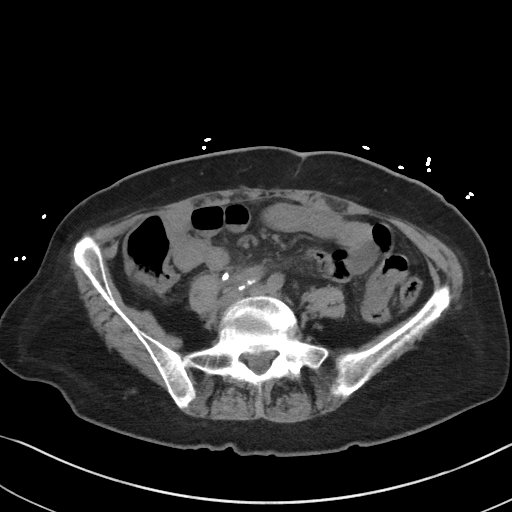
[im 48/96  soft-tissue]
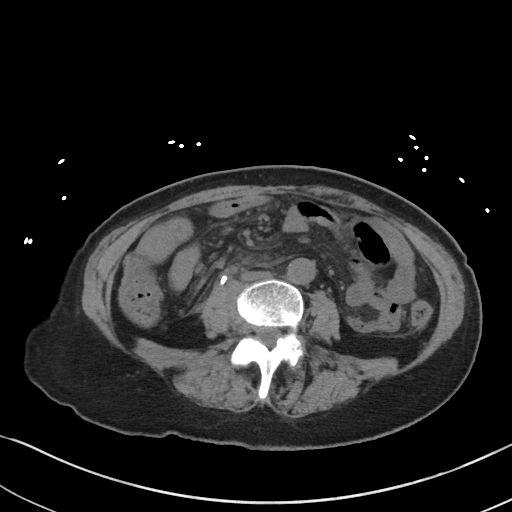
[im 56/96  soft-tissue]
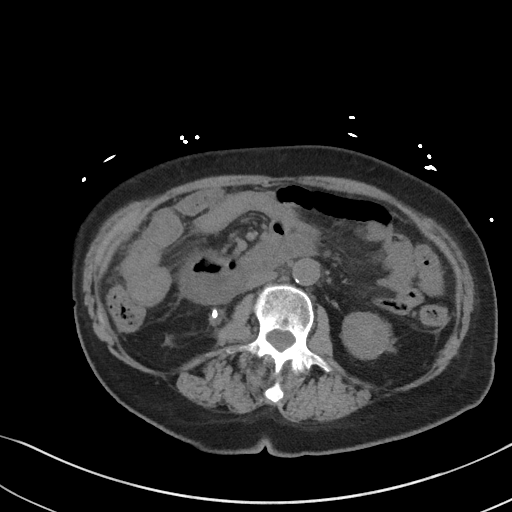
[im 64/96  soft-tissue]
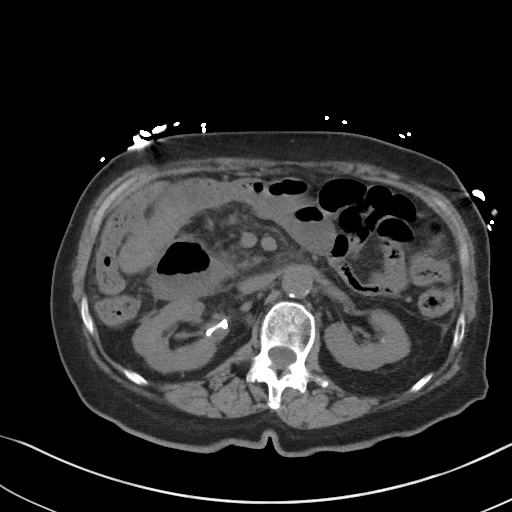
[im 64/96  bone]
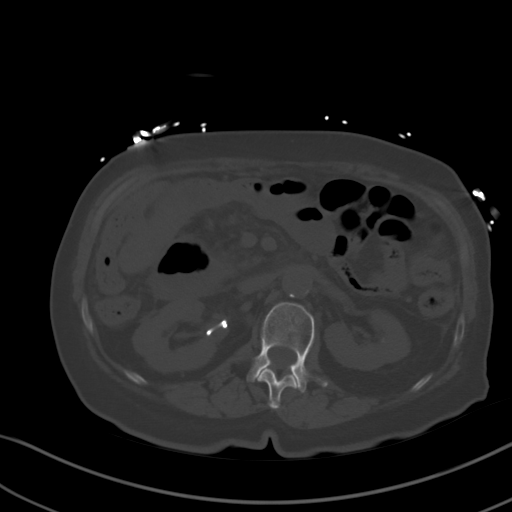
[im 68/96  soft-tissue]
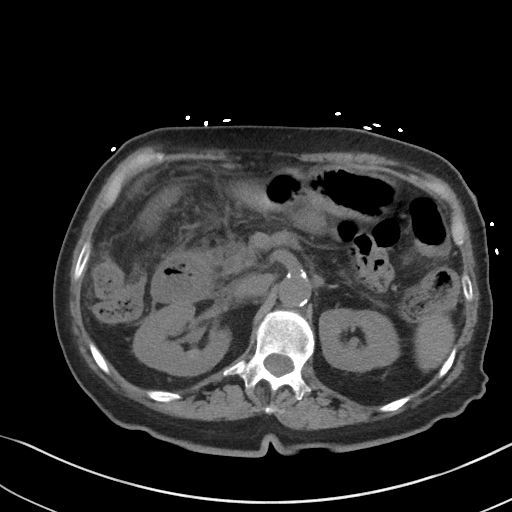
[im 76/96  soft-tissue]
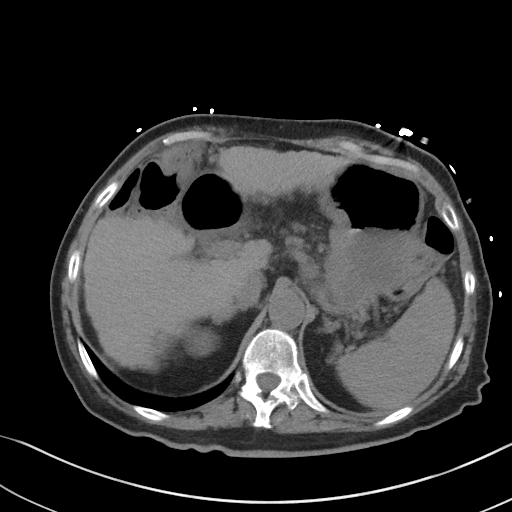
[im 84/96  soft-tissue]
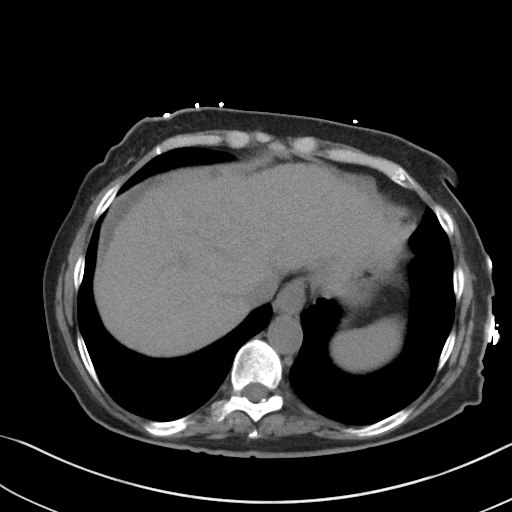
[im 92/96  soft-tissue]
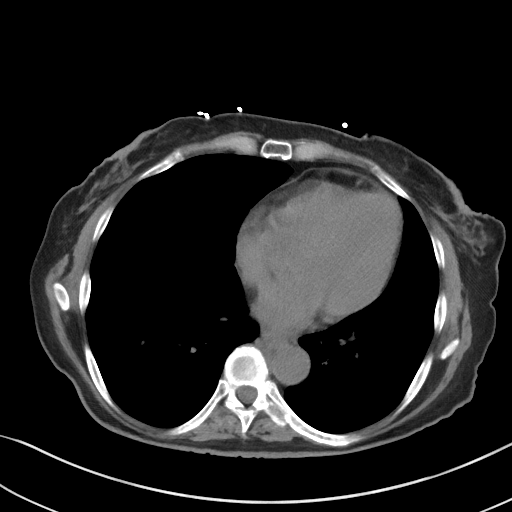

[Series 5: coronal · coronal · 0.80mm/px · 3 of 119 slices shown]
[im 40/119  soft-tissue]
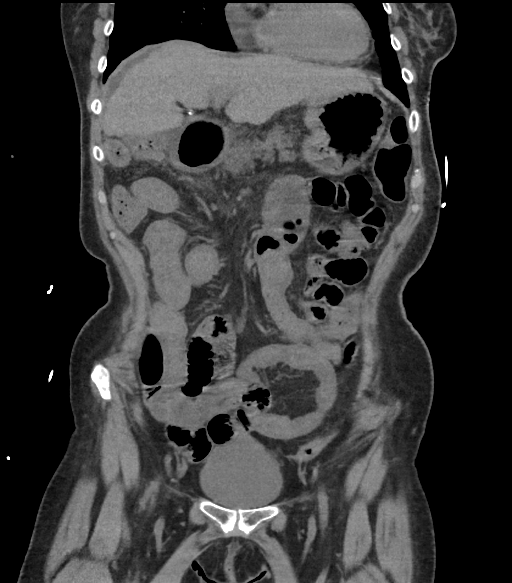
[im 53/119  soft-tissue]
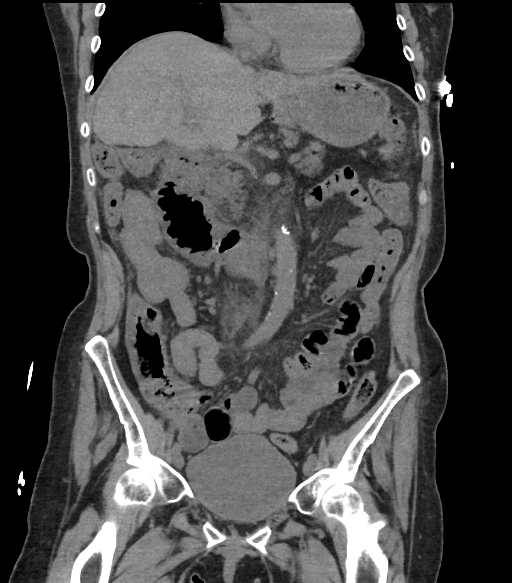
[im 66/119  soft-tissue]
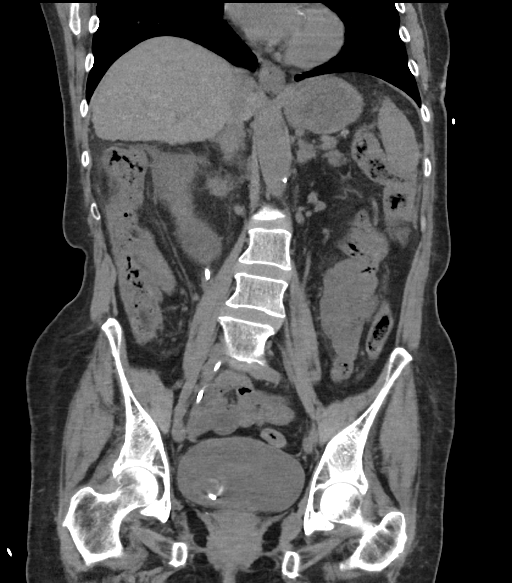

[16 of 46 positions shown; findings below may reference images not displayed]

FINDINGS: Lower chest: No pulmonary nodules, pleural effusions, or
infiltrates. Heart size is normal. No imaged pericardial effusion or
significant coronary artery calcifications.

Hepatobiliary: Nodular contour of the liver. No focal liver lesions
identified on this noncontrast exam. Status post cholecystectomy.
Recanalized umbilical vein.

Pancreas: No focal pancreatic lesion identified.

Spleen: Normal in size without focal abnormality.

Adrenals/Urinary Tract: Mildly nodular adrenal glands bilaterally.
The right ureter contains a double-J stent, upper portion within the
region of the renal pelvis in the lower portion within the urinary
bladder. No definite ureteral stones are identified. Small
intrarenal calculi are identified in the lower pole of the kidneys
bilaterally. Small punctate calcifications are identified in the
midpole left kidney. No hydronephrosis.

Stomach/Bowel: There is dilatation and wall thickening of the
duodenal bulb, descending, and horizontal portion of the duodenum,
most consistent with duodenitis. This is a change since prior study.
There is surrounding stranding. No evidence for perforation. Small
mesenteric lymph nodes are also present [REDACTED] be reactive.
Remainder of the small bowel including the terminal ileal loops are
normal in appearance. There scattered colonic diverticula but no
acute diverticulitis. The appendix is not well seen.

Vascular/Lymphatic: There is atherosclerotic calcification of the
abdominal aorta. No aneurysm. No significant retroperitoneal
adenopathy.

Reproductive: Status post hysterectomy.  No adnexal mass.

Other: None

Musculoskeletal: Scoliosis and associated degenerative changes.
IMPRESSION: 1. Interval development of dilatation and wall thickening of the
duodenum most consistent with duodenitis. Peptic ulcer disease and
lymphoma can have a similar appearance but are felt to be less
likely.
2. Cirrhosis and portal venous hypertension.
3. Right ureteral stent.
4. Nonobstructing bilateral intrarenal calculi.
5. Colonic diverticulosis.
6. Status post hysterectomy.
7. Scoliosis and degenerative changes in the spine.

## 2018-07-07 DIAGNOSIS — F1120 Opioid dependence, uncomplicated: Secondary | ICD-10-CM | POA: Diagnosis not present

## 2018-07-07 DIAGNOSIS — G894 Chronic pain syndrome: Secondary | ICD-10-CM | POA: Diagnosis not present

## 2018-07-07 DIAGNOSIS — I10 Essential (primary) hypertension: Secondary | ICD-10-CM | POA: Diagnosis not present

## 2018-07-07 DIAGNOSIS — M5412 Radiculopathy, cervical region: Secondary | ICD-10-CM | POA: Diagnosis not present

## 2018-07-07 DIAGNOSIS — M545 Low back pain: Secondary | ICD-10-CM | POA: Diagnosis not present

## 2018-07-07 DIAGNOSIS — Z79899 Other long term (current) drug therapy: Secondary | ICD-10-CM | POA: Diagnosis not present

## 2018-07-07 DIAGNOSIS — M542 Cervicalgia: Secondary | ICD-10-CM | POA: Diagnosis not present

## 2018-07-07 DIAGNOSIS — M5416 Radiculopathy, lumbar region: Secondary | ICD-10-CM | POA: Diagnosis not present

## 2018-08-14 ENCOUNTER — Encounter: Payer: Self-pay | Admitting: Primary Care

## 2018-08-14 ENCOUNTER — Telehealth: Payer: Self-pay | Admitting: Primary Care

## 2018-08-14 ENCOUNTER — Ambulatory Visit (INDEPENDENT_AMBULATORY_CARE_PROVIDER_SITE_OTHER): Payer: Medicare HMO | Admitting: Primary Care

## 2018-08-14 VITALS — BP 148/72 | HR 122 | Temp 98.2°F | Ht 63.0 in | Wt 114.8 lb

## 2018-08-14 DIAGNOSIS — Z9119 Patient's noncompliance with other medical treatment and regimen: Secondary | ICD-10-CM | POA: Insufficient documentation

## 2018-08-14 DIAGNOSIS — G894 Chronic pain syndrome: Secondary | ICD-10-CM | POA: Diagnosis not present

## 2018-08-14 DIAGNOSIS — E1165 Type 2 diabetes mellitus with hyperglycemia: Secondary | ICD-10-CM

## 2018-08-14 DIAGNOSIS — Z8639 Personal history of other endocrine, nutritional and metabolic disease: Secondary | ICD-10-CM | POA: Insufficient documentation

## 2018-08-14 DIAGNOSIS — K7469 Other cirrhosis of liver: Secondary | ICD-10-CM

## 2018-08-14 DIAGNOSIS — Z91199 Patient's noncompliance with other medical treatment and regimen due to unspecified reason: Secondary | ICD-10-CM | POA: Insufficient documentation

## 2018-08-14 DIAGNOSIS — F1110 Opioid abuse, uncomplicated: Secondary | ICD-10-CM

## 2018-08-14 DIAGNOSIS — I10 Essential (primary) hypertension: Secondary | ICD-10-CM

## 2018-08-14 DIAGNOSIS — E1129 Type 2 diabetes mellitus with other diabetic kidney complication: Secondary | ICD-10-CM

## 2018-08-14 DIAGNOSIS — I480 Paroxysmal atrial fibrillation: Secondary | ICD-10-CM

## 2018-08-14 DIAGNOSIS — G8929 Other chronic pain: Secondary | ICD-10-CM

## 2018-08-14 DIAGNOSIS — M549 Dorsalgia, unspecified: Secondary | ICD-10-CM

## 2018-08-14 DIAGNOSIS — E1110 Type 2 diabetes mellitus with ketoacidosis without coma: Secondary | ICD-10-CM

## 2018-08-14 DIAGNOSIS — M542 Cervicalgia: Secondary | ICD-10-CM | POA: Diagnosis not present

## 2018-08-14 DIAGNOSIS — D649 Anemia, unspecified: Secondary | ICD-10-CM

## 2018-08-14 DIAGNOSIS — E440 Moderate protein-calorie malnutrition: Secondary | ICD-10-CM

## 2018-08-14 DIAGNOSIS — I639 Cerebral infarction, unspecified: Secondary | ICD-10-CM

## 2018-08-14 DIAGNOSIS — R809 Proteinuria, unspecified: Secondary | ICD-10-CM

## 2018-08-14 DIAGNOSIS — E785 Hyperlipidemia, unspecified: Secondary | ICD-10-CM | POA: Diagnosis not present

## 2018-08-14 DIAGNOSIS — Z794 Long term (current) use of insulin: Secondary | ICD-10-CM

## 2018-08-14 LAB — COMPREHENSIVE METABOLIC PANEL
ALBUMIN: 3.2 g/dL — AB (ref 3.5–5.2)
ALK PHOS: 129 U/L — AB (ref 39–117)
ALT: 14 U/L (ref 0–35)
AST: 19 U/L (ref 0–37)
BILIRUBIN TOTAL: 1 mg/dL (ref 0.2–1.2)
BUN: 17 mg/dL (ref 6–23)
CO2: 22 mEq/L (ref 19–32)
Calcium: 8.5 mg/dL (ref 8.4–10.5)
Chloride: 92 mEq/L — ABNORMAL LOW (ref 96–112)
Creatinine, Ser: 1.31 mg/dL — ABNORMAL HIGH (ref 0.40–1.20)
GFR: 43.36 mL/min — ABNORMAL LOW (ref >60.00)
Glucose, Bld: 459 mg/dL — ABNORMAL HIGH (ref 70–99)
POTASSIUM: 3 meq/L — AB (ref 3.5–5.1)
Sodium: 127 mEq/L — ABNORMAL LOW (ref 135–145)
TOTAL PROTEIN: 7.7 g/dL (ref 6.0–8.3)

## 2018-08-14 LAB — CBC
HCT: 38.6 % (ref 36.0–46.0)
HEMOGLOBIN: 12.5 g/dL (ref 12.0–15.0)
MCHC: 32.3 g/dL (ref 30.0–36.0)
MCV: 86.1 fl (ref 78.0–100.0)
PLATELETS: 236 10*3/uL (ref 150.0–400.0)
RBC: 4.48 Mil/uL (ref 3.87–5.11)
RDW: 16 % — ABNORMAL HIGH (ref 11.5–15.5)
WBC: 10.5 10*3/uL (ref 4.0–10.5)

## 2018-08-14 LAB — POCT GLYCOSYLATED HEMOGLOBIN (HGB A1C): Hemoglobin A1C: 14.6 % — AB (ref 4.0–5.6)

## 2018-08-14 LAB — LIPID PANEL
CHOLESTEROL: 106 mg/dL (ref 0–200)
HDL: 23.5 mg/dL — ABNORMAL LOW (ref >39.00)
LDL Cholesterol: 51 mg/dL (ref 0–99)
NONHDL: 82.03
Total CHOL/HDL Ratio: 4
Triglycerides: 156 mg/dL — ABNORMAL HIGH (ref 0.0–149.0)
VLDL: 31.2 mg/dL (ref 0.0–40.0)

## 2018-08-14 LAB — TSH: TSH: 1.61 u[IU]/mL (ref 0.35–4.50)

## 2018-08-14 LAB — GLUCOSE, POCT (MANUAL RESULT ENTRY): POC GLUCOSE: 463 mg/dL — AB (ref 70–99)

## 2018-08-14 MED ORDER — METOPROLOL TARTRATE 25 MG PO TABS: 25.0000 mg | ORAL_TABLET | Freq: Two times a day (BID) | ORAL | 0 refills | Status: DC

## 2018-08-14 MED ORDER — BLOOD GLUCOSE MONITOR KIT: PACK | 0 refills | Status: DC

## 2018-08-14 MED ORDER — ATORVASTATIN CALCIUM 40 MG PO TABS: 40.0000 mg | ORAL_TABLET | Freq: Every day | ORAL | 0 refills | Status: DC

## 2018-08-14 MED ORDER — INSULIN DETEMIR 100 UNIT/ML FLEXPEN: 15.0000 [IU] | PEN_INJECTOR | Freq: Two times a day (BID) | SUBCUTANEOUS | 2 refills | Status: DC

## 2018-08-14 MED ORDER — APIXABAN 5 MG PO TABS: 5.0000 mg | ORAL_TABLET | Freq: Two times a day (BID) | ORAL | 0 refills | Status: DC

## 2018-08-14 NOTE — Assessment & Plan Note (Signed)
We had a very frank discussion today regarding the importance of adheranse to her prescribed regimen and recommendations for follow up.  We will not continue to serve as her PCP if she cannot follow through with instructions for her healthcare, including follow up. She has a history of no shows and cancellations.   I personally walked the patient to the scheduling desk and watched her make an appointment for 2 week follow up. I also discussed the same with her caregiver who was there today.

## 2018-08-14 NOTE — Assessment & Plan Note (Signed)
She appears cachetic today during visit. A1C of 14 and has a long history of chronic hyperglycemia.  Continue to monitor.

## 2018-08-14 NOTE — Telephone Encounter (Signed)
Edwin Dada with [facility] called to verify records received at [facility] fax # [phone]. [place] received records and will get to [name] NP today. FYI to [name] NP.

## 2018-08-14 NOTE — Assessment & Plan Note (Signed)
Patient is at very high risk for recurrent stroke. Discussed the importance of mediation and dietary compliance to prevent this from occurring again.   Resumed statin. Will monitor lipids.

## 2018-08-14 NOTE — Assessment & Plan Note (Signed)
Repeat TSH unremarkable.

## 2018-08-14 NOTE — Telephone Encounter (Signed)
error 

## 2018-08-14 NOTE — Assessment & Plan Note (Signed)
Rhythm regular today with sinus tachycardia. Has been off of apixaban and metoprolol for two weeks, both medications resumed today. Follow up in 2 weeks.

## 2018-08-14 NOTE — Assessment & Plan Note (Signed)
CBC today overall unremarkable. Continue to monitor.

## 2018-08-14 NOTE — Assessment & Plan Note (Signed)
LFT's pending.

## 2018-08-14 NOTE — Assessment & Plan Note (Signed)
Most recent admission. Hyperglycemia today with non fasting glucose in the high 400's, A1C of 14. Out of insulin for the last 2 weeks, refills sent to pharmacy. Increase Levemir to 15 units BID, stop Novolog for now. Rx for new glucometer kit sent to pharmacy. Provided detailed instructions on her AVS for insulin and glucose checks discussed this several times before checkout.   Hospital notes reviewed.  

## 2018-08-14 NOTE — Assessment & Plan Note (Signed)
Non compliant for years, also with numerous hospital visits. Hyperglycemia today with non fasting glucose in the high 400's, A1C of 14. Out of insulin for the last 2 weeks, refills sent to pharmacy. Increase Levemir to 15 units BID, stop Novolog for now. Rx for new glucometer kit sent to pharmacy. Provided detailed instructions on her AVS for insulin and glucose checks discussed this several times before checkout.   Follow up in 2 weeks with glucose logs. I personally walked her to the front desk to help her schedule a follow up visit for that time. I also discussed the importance of insulin compliance with both she and her caregiver.

## 2018-08-14 NOTE — Telephone Encounter (Signed)
[place]/ [facility] called to report that the pt was discharged from their office today because the medication she was given by you guys wasn't taking it or possibly selling it because it was not in her system when they tested her. Best cb # is [phone]

## 2018-08-14 NOTE — Assessment & Plan Note (Signed)
Previously following with Dr. Humphrey Rolls and his team, patient wishes to transfer elsewhere due to difficulties with scheduling. Referral placed to pain management.  After her visit we received a call from [facility] stating the there was no evididence of her oxycodone in her most recent drug screen. There is suspicion from their office that she may be selling her medication. Based off of her history with [name] this is likely correct.  We will not be providing her with any pain medication, she is aware.

## 2018-08-14 NOTE — Assessment & Plan Note (Signed)
Previously following with Dr. Humphrey Rolls and his team, patient wishes to transfer elsewhere due to difficulties with scheduling. Referral placed to pain management.  After her visit we received a call from [facility] stating the there was no evididence of her oxycodone in her most recent drug screen. There is suspicion from their office that she may be selling her medication. Based off of her history with [name] this is likely correct.  We will not be providing her with any pain medication, she is aware.

## 2018-08-14 NOTE — Telephone Encounter (Signed)
Noted. Did not prescribe narcotics during this visit. Referral for new pain management clinic placed.

## 2018-08-14 NOTE — Assessment & Plan Note (Signed)
Resume statin today.

## 2018-08-14 NOTE — Patient Instructions (Addendum)
Stop by the front desk and speak with either Rosaria Ferries or Anastasiya regarding your referral to pain management.  Resume your medications: apixaban 5 mg tablets. This is for your irregular heart rate. Take this twice daily.  Resume your atorvastatin 40 mg. This is for your cholesterol. Take this once daily.  Resume your metoprolol tartrate 25 mg. This is for blood pressure and heart rate. Take this twice daily.  Resume your insulin detemir (Levemir). Inject 15 units in the morning and 15 units in the evening.  Start checking your blood sugars three times daily: Before breakfast Before lunch Before dinner  Pick up your glucometer, test strips, lancets from the pharmacy today.  Schedule a follow up visit in 2 weeks for glucose and blood pressure check.  It was a pleasure to see you today!

## 2018-08-14 NOTE — Telephone Encounter (Signed)
Correction: The medication was prescribed by them ([facility]).

## 2018-08-14 NOTE — Assessment & Plan Note (Signed)
BP above goal in the office today, has been off both metoprolol and lisinopril for 2 weeks. We resumed metoprolol given her tachycardia which is also likely secondary to hyperglycemia and dehydration. We will resume her lisinopril at next visit in 2 weeks.

## 2018-08-14 NOTE — Progress Notes (Signed)
Subjective:    Patient ID: [name], female    DOB: [birth year], 62 y.o.   MRN: [id number]  HPI  [name] is a 62 year old female with a history of medical non compliance, uncontrolled diabetes, hypertension, atrial fibrillation, NSTEMI, cardiomyopathy, encephalopathy, chronic back pain who presents today requesting referral to pain management.  I have not seen this patient since January 2018, she has a history of no showing appointments, medication non compliance, and canceling appointments.   1) Type 2 Diabetes: Currently prescribed Levemir 12 units HS, Novolog 5 units TID with meals. Her last A1C was 12.9 in September 2019. She was admitted to [facility] in September for acute encephalopathy due to diabetic ketoacidosis from uncontrolled diabetes.  She has not had her insulin in 2 weeks as it was thrown out with her broken refrigerator. Prior to her insulin being thrown away she was doing Levemir 15 units and is uncertain of how much Novolog but was injecting once daily.  She has not checked her glucose in over one month.  Needs a new glucometer with test trips.  She admits today that she does not feel well and has not felt well in several weeks, denies chest pain, shortness of breath.  2) Essential Hypertension/Atrial Fibrillation/CAD: Currently prescribed apixipan 5 mg BID, lisinopril 5 mg, metoprolol tartrate 25 mg BID, atorvastatin 40 mg.   She is unsure of what medications she's actually been taking, has not taken any medication in 2 weeks as she kept her pill and insulin medications in the refrigerator that was thrown out 2 weeks ago.  BP Readings from Last 3 Encounters:  08/14/18 (!) 148/72  05/24/18 (!) 155/84  12/30/17 112/70     3) Chronic Neck and Back Pain: Several visits for this in the past. Currently managed on oxycodone ER 9 mg every 8 hours and is getting refills monthly from a Bernadene Person and Dr. Humphrey Rolls. Her last fill date was on 06/19/18 for 90 tablets.     She last visited Dr. Humphrey Rolls (pain management) in November 2019, wants to switch locations due to Dr. Laurelyn Sickle limited schedule.    Review of Systems  Constitutional: Positive for fatigue.  Respiratory: Negative for shortness of breath.   Cardiovascular: Negative for chest pain.  Gastrointestinal: Negative for blood in stool, nausea and vomiting.  Genitourinary: Negative for hematuria.  Musculoskeletal: Positive for arthralgias.  Neurological: Positive for dizziness. Negative for numbness.       Past Medical History:  Diagnosis Date  . Allergy   . Anxiety   . Cancer (HCC)    HX OF CANCER OF UTERUS   . Cirrhosis of liver not due to alcohol ([place]) 2016  . Degenerative disk disease   . Diverticulitis   . Gastroparesis   . GERD (gastroesophageal reflux disease)   . History of hiatal hernia   . Hypertension   . Hypothyroid   . Intussusception intestine ([place]) 05/2015  . PAF (paroxysmal atrial fibrillation) ([place]) 03/2015   a. new onset 03/2015 in setting of intractable N/V; b. on Eliquis 5 mg bid; c. CHADSVASc 4 (DM, TIA x 2, female)  . Pancreatitis   . Sick sinus syndrome ([place])   . Stomach ulcer   . Stroke [facility])    with minimal left sided weakness  . Syncope 01/2015  . TIA (transient ischemic attack) 02/2015  . Type 1 diabetes ([place])    on levemir     Social History   Socioeconomic History  .  Marital status: Married    Spouse name: Not on file  . Number of children: Not on file  . Years of education: Not on file  . Highest education level: Not on file  Occupational History  . Occupation: Disabled 2nd back problems  Social Needs  . Financial resource strain: Not on file  . Food insecurity:    Worry: Not on file    Inability: Not on file  . Transportation needs:    Medical: Not on file    Non-medical: Not on file  Tobacco Use  . Smoking status: Former Smoker    Types: Cigarettes  . Smokeless tobacco: Never Used  . Tobacco comment: 25 years ago and only smoked  occasionally  Substance and Sexual Activity  . Alcohol use: No  . Drug use: No  . Sexual activity: Not Currently  Lifestyle  . Physical activity:    Days per week: Not on file    Minutes per session: Not on file  . Stress: Not on file  Relationships  . Social connections:    Talks on phone: Not on file    Gets together: Not on file    Attends religious service: Not on file    Active member of club or organization: Not on file    Attends meetings of clubs or organizations: Not on file    Relationship status: Not on file  . Intimate partner violence:    Fear of current or ex partner: Not on file    Emotionally abused: Not on file    Physically abused: Not on file    Forced sexual activity: Not on file  Other Topics Concern  . Not on file  Social History Narrative   Lives in [place], [place] with her husband and 2 sons.    Past Surgical History:  Procedure Laterality Date  . ABDOMINAL HYSTERECTOMY    . CARDIAC CATHETERIZATION N/A 01/12/2016   Procedure: Left Heart Cath and Coronary Angiography;  Surgeon: Wellington Hampshire, MD;  Location: [facility];  Service: Cardiovascular;  Laterality: N/A;  . CHOLECYSTECTOMY    . CYSTOSCOPY/URETEROSCOPY/HOLMIUM LASER Right 07/14/2016   Procedure: CYSTOSCOPY/URETEROSCOPY/HOLMIUM LASER;  Surgeon: Alexis Frock, MD;  Location: [facility];  Service: Urology;  Laterality: Right;  . ESOPHAGOGASTRODUODENOSCOPY N/A 04/04/2015   Procedure: ESOPHAGOGASTRODUODENOSCOPY (EGD);  Surgeon: Hulen Luster, MD;  Location: [facility];  Service: Endoscopy;  Laterality: N/A;  . ESOPHAGOGASTRODUODENOSCOPY N/A 12/28/2017   Procedure: ESOPHAGOGASTRODUODENOSCOPY (EGD);  Surgeon: Lin Landsman, MD;  Location: [facility]COPY;  Service: Gastroenterology;  Laterality: N/A;  . ESOPHAGOGASTRODUODENOSCOPY (EGD) WITH PROPOFOL N/A 01/18/2016   Procedure: ESOPHAGOGASTRODUODENOSCOPY (EGD) WITH PROPOFOL;  Surgeon: Lucilla Lame, MD;  Location: [facility] ENDOSCOPY;  Service:  Endoscopy;  Laterality: N/A;  . FLEXIBLE SIGMOIDOSCOPY N/A 01/18/2016   Procedure: FLEXIBLE SIGMOIDOSCOPY;  Surgeon: Lucilla Lame, MD;  Location: [facility] ENDOSCOPY;  Service: Endoscopy;  Laterality: N/A;  . HERNIA REPAIR      Family History  Problem Relation Age of Onset  . Hypertension Mother   . CAD Sister   . Heart attack Sister        Deceased November 20, 2014  . CAD Brother     Allergies  Allergen Reactions  . Hydrocodone Other (See Comments)    Pt states that this medication caused cirrhosis of the liver.    . Aspirin   . Erythromycin Other (See Comments)    Reaction:  Fever   . Prednisone Other (See Comments)    Reaction:  Unknown   .  Rosiglitazone Maleate Swelling  . Codeine Sulfate Rash  . Tetanus-Diphtheria Toxoids Td Rash and Other (See Comments)    Reaction:  Fever     Current Outpatient Medications on File Prior to Visit  Medication Sig Dispense Refill  . albuterol (PROVENTIL HFA;VENTOLIN HFA) 108 (90 Base) MCG/ACT inhaler Inhale 2 puffs into the lungs every 6 (six) hours as needed for wheezing or shortness of breath. 1 Inhaler 0  . oxyCODONE ER (XTAMPZA ER) 9 MG C12A Take 9 mg by mouth every 8 (eight) hours.    . budesonide-formoterol (SYMBICORT) 160-4.5 MCG/ACT inhaler Inhale 2 puffs into the lungs 2 (two) times daily. (Patient not taking: Reported on 08/14/2018) 1 Inhaler 0  . lisinopril (PRINIVIL,ZESTRIL) 5 MG tablet Take 1 tablet by mouth every morning for kidney protection against diabetes. (Patient not taking: Reported on 08/14/2018) 30 tablet 0  . potassium chloride 20 MEQ TBCR Take 20 mEq by mouth daily. (Patient not taking: Reported on 08/14/2018) 30 tablet 1   No current facility-administered medications on file prior to visit.     BP (!) 148/72   Pulse (!) 122   Temp 98.2 F (36.8 C) (Oral)   Ht 5' 3"  (1.6 m)   Wt 114 lb 12 oz (52.1 kg)   SpO2 98%   BMI 20.33 kg/m    Objective:   Physical Exam  Constitutional: She is oriented to person, place, and time.  She appears cachectic.  Neck: Neck supple.  Cardiovascular: Regular rhythm.  Sinus tachycardia  Respiratory: Effort normal and breath sounds normal. She has no wheezes. She has no rales.  GI: Soft. Bowel sounds are normal.  Musculoskeletal:  Decrease in range of motion to lower back.  Ambulatory with cane in clinic.  Did have some difficulty getting up onto the exam table.  Neurological: She is alert and oriented to person, place, and time.  Skin: Skin is warm and dry.  Psychiatric: She has a normal mood and affect.           Assessment & Plan:

## 2018-08-14 NOTE — Assessment & Plan Note (Signed)
Recent UDS negative for her oxycodone per pain management. Will not be giving patient narcotics.

## 2018-08-14 NOTE — Telephone Encounter (Signed)
[place]/[facility] called requesting a Rx to be sent for the glucose monitor and test strips.

## 2018-08-15 NOTE — Telephone Encounter (Signed)
This was faxed to [facility] yesterday 08/14/2018 and confirmation  that they received it. Will re-fax the Rx.

## 2018-08-18 ENCOUNTER — Observation Stay (HOSPITAL_COMMUNITY)
Admission: EM | Admit: 2018-08-18 | Discharge: 2018-08-20 | Disposition: A | Discharge status: Home / Self Care | Payer: Medicare HMO | Attending: Internal Medicine | Admitting: Internal Medicine

## 2018-08-18 ENCOUNTER — Emergency Department (HOSPITAL_COMMUNITY): Payer: Medicare HMO

## 2018-08-18 ENCOUNTER — Telehealth: Payer: Self-pay

## 2018-08-18 ENCOUNTER — Observation Stay (HOSPITAL_COMMUNITY): Payer: Medicare HMO

## 2018-08-18 ENCOUNTER — Ambulatory Visit: Payer: Medicare HMO | Admitting: Family Medicine

## 2018-08-18 ENCOUNTER — Encounter (HOSPITAL_COMMUNITY): Payer: Self-pay

## 2018-08-18 DIAGNOSIS — W19XXXA Unspecified fall, initial encounter: Secondary | ICD-10-CM | POA: Insufficient documentation

## 2018-08-18 DIAGNOSIS — I4891 Unspecified atrial fibrillation: Secondary | ICD-10-CM | POA: Diagnosis present

## 2018-08-18 DIAGNOSIS — I480 Paroxysmal atrial fibrillation: Secondary | ICD-10-CM | POA: Insufficient documentation

## 2018-08-18 DIAGNOSIS — N179 Acute kidney failure, unspecified: Secondary | ICD-10-CM | POA: Diagnosis not present

## 2018-08-18 DIAGNOSIS — R55 Syncope and collapse: Secondary | ICD-10-CM | POA: Diagnosis not present

## 2018-08-18 DIAGNOSIS — S199XXA Unspecified injury of neck, initial encounter: Secondary | ICD-10-CM | POA: Diagnosis not present

## 2018-08-18 DIAGNOSIS — I951 Orthostatic hypotension: Principal | ICD-10-CM

## 2018-08-18 DIAGNOSIS — N132 Hydronephrosis with renal and ureteral calculous obstruction: Secondary | ICD-10-CM | POA: Diagnosis not present

## 2018-08-18 DIAGNOSIS — I495 Sick sinus syndrome: Secondary | ICD-10-CM | POA: Diagnosis present

## 2018-08-18 DIAGNOSIS — S0990XA Unspecified injury of head, initial encounter: Secondary | ICD-10-CM | POA: Diagnosis not present

## 2018-08-18 DIAGNOSIS — E1165 Type 2 diabetes mellitus with hyperglycemia: Secondary | ICD-10-CM | POA: Diagnosis present

## 2018-08-18 DIAGNOSIS — R9431 Abnormal electrocardiogram [ECG] [EKG]: Secondary | ICD-10-CM | POA: Diagnosis present

## 2018-08-18 DIAGNOSIS — D649 Anemia, unspecified: Secondary | ICD-10-CM

## 2018-08-18 DIAGNOSIS — I10 Essential (primary) hypertension: Secondary | ICD-10-CM | POA: Diagnosis present

## 2018-08-18 DIAGNOSIS — R110 Nausea: Secondary | ICD-10-CM | POA: Diagnosis not present

## 2018-08-18 DIAGNOSIS — S79912A Unspecified injury of left hip, initial encounter: Secondary | ICD-10-CM | POA: Diagnosis not present

## 2018-08-18 DIAGNOSIS — I959 Hypotension, unspecified: Secondary | ICD-10-CM | POA: Diagnosis not present

## 2018-08-18 DIAGNOSIS — Z87891 Personal history of nicotine dependence: Secondary | ICD-10-CM | POA: Insufficient documentation

## 2018-08-18 DIAGNOSIS — S299XXA Unspecified injury of thorax, initial encounter: Secondary | ICD-10-CM | POA: Diagnosis not present

## 2018-08-18 DIAGNOSIS — N183 Chronic kidney disease, stage 3 (moderate): Secondary | ICD-10-CM

## 2018-08-18 DIAGNOSIS — N1831 Chronic kidney disease, stage 3a: Secondary | ICD-10-CM | POA: Diagnosis present

## 2018-08-18 DIAGNOSIS — R42 Dizziness and giddiness: Secondary | ICD-10-CM | POA: Diagnosis not present

## 2018-08-18 DIAGNOSIS — M25552 Pain in left hip: Secondary | ICD-10-CM | POA: Diagnosis not present

## 2018-08-18 DIAGNOSIS — K7469 Other cirrhosis of liver: Secondary | ICD-10-CM | POA: Diagnosis present

## 2018-08-18 DIAGNOSIS — Z79899 Other long term (current) drug therapy: Secondary | ICD-10-CM | POA: Insufficient documentation

## 2018-08-18 DIAGNOSIS — M546 Pain in thoracic spine: Secondary | ICD-10-CM | POA: Diagnosis not present

## 2018-08-18 DIAGNOSIS — G894 Chronic pain syndrome: Secondary | ICD-10-CM | POA: Diagnosis not present

## 2018-08-18 DIAGNOSIS — R000 Tachycardia, unspecified: Secondary | ICD-10-CM | POA: Diagnosis not present

## 2018-08-18 LAB — CBC WITH DIFFERENTIAL/PLATELET
Abs Immature Granulocytes: 0.05 10*3/uL (ref 0.00–0.07)
Basophils Absolute: 0 10*3/uL (ref 0.0–0.1)
Basophils Relative: 0 %
Eosinophils Absolute: 0 10*3/uL (ref 0.0–0.5)
Eosinophils Relative: 0 %
HCT: 40.6 % (ref 36.0–46.0)
Hemoglobin: 12.7 g/dL (ref 12.0–15.0)
Immature Granulocytes: 1 %
LYMPHS ABS: 0.7 10*3/uL (ref 0.7–4.0)
Lymphocytes Relative: 7 %
MCH: 26.9 pg (ref 26.0–34.0)
MCHC: 31.3 g/dL (ref 30.0–36.0)
MCV: 86 fL (ref 80.0–100.0)
Monocytes Absolute: 0.5 10*3/uL (ref 0.1–1.0)
Monocytes Relative: 5 %
Neutro Abs: 9.7 10*3/uL — ABNORMAL HIGH (ref 1.7–7.7)
Neutrophils Relative %: 87 %
Platelets: 202 10*3/uL (ref 150–400)
RBC: 4.72 MIL/uL (ref 3.87–5.11)
RDW: 14.8 % (ref 11.5–15.5)
WBC: 11 10*3/uL — ABNORMAL HIGH (ref 4.0–10.5)
nRBC: 0 % (ref 0.0–0.2)

## 2018-08-18 LAB — I-STAT CHEM 8, ED
BUN: 25 mg/dL — ABNORMAL HIGH (ref 8–23)
CREATININE: 1.2 mg/dL — AB (ref 0.44–1.00)
Calcium, Ion: 1.02 mmol/L — ABNORMAL LOW (ref 1.15–1.40)
Chloride: 97 mmol/L — ABNORMAL LOW (ref 98–111)
GLUCOSE: 225 mg/dL — AB (ref 70–99)
HCT: 43 % (ref 36.0–46.0)
Hemoglobin: 14.6 g/dL (ref 12.0–15.0)
Potassium: 3.4 mmol/L — ABNORMAL LOW (ref 3.5–5.1)
Sodium: 132 mmol/L — ABNORMAL LOW (ref 135–145)
TCO2: 29 mmol/L (ref 22–32)

## 2018-08-18 LAB — COMPREHENSIVE METABOLIC PANEL
ALBUMIN: 2.6 g/dL — AB (ref 3.5–5.0)
ALT: 34 U/L (ref 0–44)
AST: 67 U/L — ABNORMAL HIGH (ref 15–41)
Alkaline Phosphatase: 138 U/L — ABNORMAL HIGH (ref 38–126)
Anion gap: 12 (ref 5–15)
BUN: 18 mg/dL (ref 8–23)
CO2: 24 mmol/L (ref 22–32)
Calcium: 8.6 mg/dL — ABNORMAL LOW (ref 8.9–10.3)
Chloride: 95 mmol/L — ABNORMAL LOW (ref 98–111)
Creatinine, Ser: 1.37 mg/dL — ABNORMAL HIGH (ref 0.44–1.00)
GFR calc Af Amer: 47 mL/min — ABNORMAL LOW (ref >60)
GFR calc non Af Amer: 41 mL/min — ABNORMAL LOW (ref >60)
Glucose, Bld: 226 mg/dL — ABNORMAL HIGH (ref 70–99)
Potassium: 2.8 mmol/L — ABNORMAL LOW (ref 3.5–5.1)
Sodium: 131 mmol/L — ABNORMAL LOW (ref 135–145)
Total Bilirubin: 1.7 mg/dL — ABNORMAL HIGH (ref 0.3–1.2)
Total Protein: 7.8 g/dL (ref 6.5–8.1)

## 2018-08-18 LAB — I-STAT TROPONIN, ED: Troponin i, poc: 0.01 ng/mL (ref 0.00–0.08)

## 2018-08-18 MED ORDER — INSULIN DETEMIR 100 UNIT/ML FLEXPEN: 15.0000 [IU] | PEN_INJECTOR | Freq: Two times a day (BID) | SUBCUTANEOUS | Status: DC

## 2018-08-18 MED ORDER — ACETAMINOPHEN 325 MG PO TABS
650.0000 mg | ORAL_TABLET | Freq: Four times a day (QID) | ORAL | Status: DC | PRN
  Administered 2018-08-20: 650 mg via ORAL
  Filled 2018-08-18: qty 2

## 2018-08-18 MED ORDER — INSULIN ASPART 100 UNIT/ML ~~LOC~~ SOLN: 0.0000 [IU] | Freq: Three times a day (TID) | SUBCUTANEOUS | Status: DC

## 2018-08-18 MED ORDER — APIXABAN 5 MG PO TABS
5.0000 mg | ORAL_TABLET | Freq: Two times a day (BID) | ORAL | Status: DC
  Administered 2018-08-19: 5 mg via ORAL
  Filled 2018-08-18 (×2): qty 1

## 2018-08-18 MED ORDER — SODIUM CHLORIDE 0.9 % IV BOLUS
1000.0000 mL | Freq: Once | INTRAVENOUS | Status: AC
  Administered 2018-08-18: 1000 mL via INTRAVENOUS

## 2018-08-18 MED ORDER — ACETAMINOPHEN 650 MG RE SUPP: 650.0000 mg | Freq: Four times a day (QID) | RECTAL | Status: DC | PRN

## 2018-08-18 MED ORDER — CYCLOBENZAPRINE HCL 10 MG PO TABS: 10.0000 mg | ORAL_TABLET | Freq: Three times a day (TID) | ORAL | Status: DC | PRN

## 2018-08-18 MED ORDER — OXYCODONE ER 9 MG PO C12A: 9.0000 mg | EXTENDED_RELEASE_CAPSULE | Freq: Three times a day (TID) | ORAL | Status: DC

## 2018-08-18 MED ORDER — INSULIN DETEMIR 100 UNIT/ML ~~LOC~~ SOLN
15.0000 [IU] | Freq: Two times a day (BID) | SUBCUTANEOUS | Status: DC
  Administered 2018-08-19 – 2018-08-20 (×4): 15 [IU] via SUBCUTANEOUS
  Filled 2018-08-18 (×5): qty 0.15

## 2018-08-18 MED ORDER — ATORVASTATIN CALCIUM 40 MG PO TABS
40.0000 mg | ORAL_TABLET | Freq: Every day | ORAL | Status: DC
  Administered 2018-08-19 – 2018-08-20 (×2): 40 mg via ORAL
  Filled 2018-08-18 (×2): qty 1

## 2018-08-18 MED ORDER — SODIUM CHLORIDE 0.9 % IV SOLN
INTRAVENOUS | Status: DC
  Administered 2018-08-19 (×2): via INTRAVENOUS

## 2018-08-18 MED ORDER — ALBUTEROL SULFATE (2.5 MG/3ML) 0.083% IN NEBU: 3.0000 mL | INHALATION_SOLUTION | Freq: Four times a day (QID) | RESPIRATORY_TRACT | Status: DC | PRN

## 2018-08-18 NOTE — Progress Notes (Deleted)
There were no vitals taken for this visit.   CC: *** Subjective:    Patient ID: [name], female    DOB: 1955/09/21, 62 y.o.   MRN: [id number]  HPI: [name] is a 62 y.o. female presenting on 08/18/2018 for No chief complaint on file.   ***  Relevant past medical, surgical, family and social history reviewed and updated as indicated. Interim medical history since our last visit reviewed. Allergies and medications reviewed and updated. Outpatient Medications Prior to Visit  Medication Sig Dispense Refill  . albuterol (PROVENTIL HFA;VENTOLIN HFA) 108 (90 Base) MCG/ACT inhaler Inhale 2 puffs into the lungs every 6 (six) hours as needed for wheezing or shortness of breath. 1 Inhaler 0  . apixaban (ELIQUIS) 5 MG TABS tablet Take 1 tablet (5 mg total) by mouth 2 (two) times daily. 180 tablet 0  . atorvastatin (LIPITOR) 40 MG tablet Take 1 tablet (40 mg total) by mouth daily. For cholesterol. 90 tablet 0  . blood glucose meter kit and supplies KIT Dispense based on patient and insurance preference. Use up to four times daily as directed. (FOR ICD-9 250.00, 250.01). 1 each 0  . budesonide-formoterol (SYMBICORT) 160-4.5 MCG/ACT inhaler Inhale 2 puffs into the lungs 2 (two) times daily. (Patient not taking: Reported on 08/14/2018) 1 Inhaler 0  . Insulin Detemir (LEVEMIR FLEXPEN) 100 UNIT/ML Pen Inject 15 Units into the skin 2 (two) times daily. 15 mL 2  . lisinopril (PRINIVIL,ZESTRIL) 5 MG tablet Take 1 tablet by mouth every morning for kidney protection against diabetes. (Patient not taking: Reported on 08/14/2018) 30 tablet 0  . metoprolol tartrate (LOPRESSOR) 25 MG tablet Take 1 tablet (25 mg total) by mouth 2 (two) times daily. For blood pressure. 180 tablet 0  . oxyCODONE ER (XTAMPZA ER) 9 MG C12A Take 9 mg by mouth every 8 (eight) hours.    . potassium chloride 20 MEQ TBCR Take 20 mEq by mouth daily. (Patient not taking: Reported on 08/14/2018) 30 tablet 1   No  facility-administered medications prior to visit.      Per HPI unless specifically indicated in ROS section below Review of Systems     Objective:    There were no vitals taken for this visit.  Wt Readings from Last 3 Encounters:  08/14/18 114 lb 12 oz (52.1 kg)  05/24/18 140 lb 10.5 oz (63.8 kg)  12/27/17 154 lb 1.6 oz (69.9 kg)    Physical Exam Results for orders placed or performed in visit on 08/14/18  Comprehensive metabolic panel  Result Value Ref Range   Sodium 127 (L) 135 - 145 mEq/L   Potassium 3.0 (L) 3.5 - 5.1 mEq/L   Chloride 92 (L) 96 - 112 mEq/L   CO2 22 19 - 32 mEq/L   Glucose, Bld 459 (H) 70 - 99 mg/dL   BUN 17 6 - 23 mg/dL   Creatinine, Ser 1.31 (H) 0.40 - 1.20 mg/dL   Total Bilirubin 1.0 0.2 - 1.2 mg/dL   Alkaline Phosphatase 129 (H) 39 - 117 U/L   AST 19 0 - 37 U/L   ALT 14 0 - 35 U/L   Total Protein 7.7 6.0 - 8.3 g/dL   Albumin 3.2 (L) 3.5 - 5.2 g/dL   Calcium 8.5 8.4 - 10.5 mg/dL   GFR 43.36 (L) >60.00 mL/min  Lipid panel  Result Value Ref Range   Cholesterol 106 0 - 200 mg/dL   Triglycerides 156.0 (H) 0.0 - 149.0 mg/dL  HDL 23.50 (L) >39.00 mg/dL   VLDL 31.2 0.0 - 40.0 mg/dL   LDL Cholesterol 51 0 - 99 mg/dL   Total CHOL/HDL Ratio 4    NonHDL 82.03   TSH  Result Value Ref Range   TSH 1.61 0.35 - 4.50 uIU/mL  CBC  Result Value Ref Range   WBC 10.5 4.0 - 10.5 K/uL   RBC 4.48 3.87 - 5.11 Mil/uL   Platelets 236.0 150.0 - 400.0 K/uL   Hemoglobin 12.5 12.0 - 15.0 g/dL   HCT 38.6 36.0 - 46.0 %   MCV 86.1 78.0 - 100.0 fl   MCHC 32.3 30.0 - 36.0 g/dL   RDW 16.0 (H) 11.5 - 15.5 %  POCT glycosylated hemoglobin (Hb A1C)  Result Value Ref Range   Hemoglobin A1C 14.6 (A) 4.0 - 5.6 %   HbA1c POC (<> result, manual entry)     HbA1c, POC (prediabetic range)     HbA1c, POC (controlled diabetic range)    POCT glucose (manual entry)  Result Value Ref Range   POC Glucose 463 (A) 70 - 99 mg/dl      Assessment & Plan:   Problem List Items Addressed  This Visit    None       No orders of the defined types were placed in this encounter.  No orders of the defined types were placed in this encounter.   Follow up plan: No follow-ups on file.  Ria Bush, MD

## 2018-08-18 NOTE — ED Notes (Signed)
Patient transported to CT 

## 2018-08-18 NOTE — Telephone Encounter (Signed)
Noted.  Patient currently in the emergency department and is undergoing treatment.

## 2018-08-18 NOTE — ED Notes (Signed)
Patient transported to X-ray 

## 2018-08-18 NOTE — Telephone Encounter (Signed)
Pt walked in with walker accompanied by her husband. Since 08/16/18 pt has falled 5 times; the last time at 3AM this morning. Pt having lt ear pain and low back pain. pts BP sitting 74/52 with electronic cuff lt arm, reg cuff P 94 and pulse ox 100% room air.. Could not hear with manual cuff. BS 234; pt said has not eaten today. For few days pt not able to eat more than couple of bites and sometimes pt has N&V. Pt took levemir 15 untis at 11 AM this morning. After 15' rechecked BP sitting lt arm electronic cuff 64/49. Ambulance here and transported pt to [facility] ED. pts husband left with people that brought them to [facility] and Mr. Janus has pts walker. FYI to [name] NP.

## 2018-08-18 NOTE — ED Triage Notes (Signed)
Pt brought in by [facility] from home for multiple falls this morning. Pt hit her head on the corner of her coffee table, denies LOC. States she is on eliquis. Pt A+Ox4. Pt hypotensive on arrival.

## 2018-08-18 NOTE — ED Provider Notes (Signed)
[facility] EMERGENCY DEPARTMENT Provider Note   CSN: [id number] Arrival date & time: 08/18/18  1530     History   Chief Complaint Chief Complaint  Patient presents with  . Hypotension  . Fall    HPI [name] is a 62 y.o. female.  Patient is a 62 year old female with past medical history significant for diabetes, A. fib, hypertension presenting from primary care doctors for recurrent falls and hypotension.  Patient states that she has had difficulty ambulating for the last few days, states that every time she tries to get up she passes out.  Patient notes that she has been more compliant with her home medications, just recently has restarted her Eliquis and antihypertensives as she was not taking them for multiple months.  Patient also notes that she has restarted her insulin regiment which she was not taking as well.  Patient states that she has poor p.o. intake not interested in eating and drinking.  Fell 3 times yesterday, hit her head and left hip.     Past Medical History:  Diagnosis Date  . Acute encephalopathy 05/22/2018  . Allergy   . Anxiety   . Ascites   . C. difficile colitis 07/10/2015  . Cancer (HCC)    HX OF CANCER OF UTERUS   . Cirrhosis of liver not due to alcohol ([place]) 2016  . Degenerative disk disease   . Diverticulitis   . Gastroparesis   . GERD (gastroesophageal reflux disease)   . History of hiatal hernia   . Hypertension   . Hypothyroid   . Hypothyroidism due to amiodarone   . Ileus ([place]) 08/01/2015  . Intussusception intestine ([place]) 05/2015  . Orthostatic hypotension   . PAF (paroxysmal atrial fibrillation) ([place]) 03/2015   a. new onset 03/2015 in setting of intractable N/V; b. on Eliquis 5 mg bid; c. CHADSVASc 4 (DM, TIA x 2, female)  . Pancreatitis   . Pneumonia 11/14/2015  . Right ureteral stone 07/14/2016  . Sick sinus syndrome ([place])   . Stomach ulcer   . Stroke [facility])    with minimal left sided weakness  . Syncope  01/2015  . Syncope due to orthostatic hypotension 05/18/2015  . Tachyarrhythmia 01/10/2016  . TIA (transient ischemic attack) 02/2015  . Type 1 diabetes ([place])    on levemir  . UTERINE CANCER, HX OF 03/27/2007   Qualifier: Diagnosis of  By: Maxie Better FNP, Rosalita Levan   . UTI (urinary tract infection) 05/22/2018    Patient Active Problem List   Diagnosis Date Noted  . Orthostatic hypotension 08/18/2018  . Normochromic normocytic anemia 08/18/2018  . ARF (acute renal failure) ([place]) 08/18/2018  . Chronic neck and back pain 08/14/2018  . History of hypothyroidism 08/14/2018  . Medical non-compliance 08/14/2018  . Diabetic ketoacidosis ([place]) 05/22/2018  . Sepsis ([place]) 12/15/2017  . Personal history of surgery to heart and great vessels, presenting hazards to health   . Congestive dilated cardiomyopathy ([place])   . NSTEMI (non-ST elevated myocardial infarction) ([place]) 01/11/2016  . Tachy-brady syndrome ([place]) 12/28/2015  . Narcotic withdrawal ([place]) 11/11/2015  . H/O TIA (transient ischemic attack) and stroke   . Cryptogenic cirrhosis ([place]) 07/10/2015  . Paroxysmal atrial fibrillation ([place]) 07/10/2015  . Malnutrition of moderate degree 07/04/2015  . Symptomatic bradycardia 05/27/2015  . Chronic anemia 05/19/2015  . Thrombocytopenia ([place]) 05/19/2015  . Prolonged QT interval   . Hypomagnesemia   . Narcotic abuse ([place])   . Uncontrolled type 2 diabetes mellitus ([place])   .  Hypokalemia 04/06/2015  . Hyperlipidemia with target LDL less than 100 04/06/2015  . CVA (cerebral vascular accident) ([place]) 02/15/2015  . Essential hypertension 01/12/2015  . Chronically on opiate therapy 01/12/2015  . DEPRESSION/ANXIETY 06/27/2007  . MYOFASCIAL PAIN SYNDROME 06/27/2007  . Chronic pain syndrome 03/28/2007  . GERD 03/27/2007  . DIVERTICULOSIS, COLON 03/27/2007  . LUMBAR DISC DISPLACEMENT 03/27/2007  . PROTEINURIA 03/27/2007    Past Surgical History:  Procedure Laterality Date  . ABDOMINAL HYSTERECTOMY     . CARDIAC CATHETERIZATION N/A 01/12/2016   Procedure: Left Heart Cath and Coronary Angiography;  Surgeon: Wellington Hampshire, MD;  Location: [facility] CV LAB;  Service: Cardiovascular;  Laterality: N/A;  . CHOLECYSTECTOMY    . CYSTOSCOPY/URETEROSCOPY/HOLMIUM LASER Right 07/14/2016   Procedure: CYSTOSCOPY/URETEROSCOPY/HOLMIUM LASER;  Surgeon: Alexis Frock, MD;  Location: [facility] ORS;  Service: Urology;  Laterality: Right;  . ESOPHAGOGASTRODUODENOSCOPY N/A 04/04/2015   Procedure: ESOPHAGOGASTRODUODENOSCOPY (EGD);  Surgeon: Hulen Luster, MD;  [facility];  Service: Endoscopy;  Laterality: N/A;  . ESOPHAGOGASTRODUODENOSCOPY N/A 12/28/2017   Procedure: ESOPHAGOGASTRODUODENOSCOPY (EGD);  Surgeon: Lin Landsman, MD;  Location: [facility] ENDOSCOPY;  Service: Gastroenterology;  Laterality: N/A;  . ESOPHAGOGASTRODUODENOSCOPY (EGD) WITH PROPOFOL N/A 01/18/2016   Procedure: ESOPHAGOGASTRODUODENOSCOPY (EGD) WITH PROPOFOL;  Surgeon: Lucilla Lame, MD;  Location: [facility] ENDOSCOPY;  Service: Endoscopy;  Laterality: N/A;  . FLEXIBLE SIGMOIDOSCOPY N/A 01/18/2016   Procedure: FLEXIBLE SIGMOIDOSCOPY;  Surgeon: Lucilla Lame, MD;  Location: [facility] ENDOSCOPY;  Service: Endoscopy;  Laterality: N/A;  . HERNIA REPAIR       OB History    Gravida  1   Para  1   Term  1   Preterm      AB      Living        SAB      TAB      Ectopic      Multiple      Live Births               Home Medications    Prior to Admission medications   Medication Sig Start Date End Date Taking? Authorizing Provider  albuterol (PROVENTIL HFA;VENTOLIN HFA) 108 (90 Base) MCG/ACT inhaler Inhale 2 puffs into the lungs every 6 (six) hours as needed for wheezing or shortness of breath. 12/17/17  Yes Sainani, Belia Heman, MD  apixaban (ELIQUIS) 5 MG TABS tablet Take 1 tablet (5 mg total) by mouth 2 (two) times daily. 08/14/18 09/13/18 Yes [name], NP  atorvastatin (LIPITOR) 40 MG tablet Take 1 tablet (40 mg total) by mouth  daily. For cholesterol. 08/14/18 09/13/18 Yes [name], NP  cyclobenzaprine (FLEXERIL) 10 MG tablet Take 10 mg by mouth 3 (three) times daily as needed for muscle spasms. 07/26/18  Yes [provider]  Insulin Detemir (LEVEMIR FLEXPEN) 100 UNIT/ML Pen Inject 15 Units into the skin 2 (two) times daily. 08/14/18  Yes [name], NP  metoprolol tartrate (LOPRESSOR) 25 MG tablet Take 1 tablet (25 mg total) by mouth 2 (two) times daily. For blood pressure. 08/14/18 09/13/18 Yes [name], NP  blood glucose meter kit and supplies KIT Dispense based on patient and insurance preference. Use up to four times daily as directed. (FOR ICD-9 250.00, 250.01). 08/14/18   [name], NP  budesonide-formoterol [facility]) 160-4.5 MCG/ACT inhaler Inhale 2 puffs into the lungs 2 (two) times daily. Patient not taking: Reported on 08/14/2018 12/17/17   Henreitta Leber, MD  lisinopril (PRINIVIL,ZESTRIL) 5 MG  tablet Take 1 tablet by mouth every morning for kidney protection against diabetes. Patient not taking: Reported on 08/14/2018 12/17/17   Henreitta Leber, MD  oxyCODONE ER [facility]) 9 MG C12A Take 9 mg by mouth every 8 (eight) hours.    [provider]  potassium chloride 20 MEQ TBCR Take 20 mEq by mouth daily. Patient not taking: Reported on 08/14/2018 12/30/17   Saundra Shelling, MD    Family History Family History  Problem Relation Age of Onset  . Hypertension Mother   . CAD Sister   . Heart attack Sister        Deceased November 26, 2014  . CAD Brother     Social History Social History   Tobacco Use  . Smoking status: Former Smoker    Types: Cigarettes  . Smokeless tobacco: Never Used  . Tobacco comment: 25 years ago and only smoked occasionally  Substance Use Topics  . Alcohol use: No  . Drug use: No     Allergies   Hydrocodone; Aspirin; Erythromycin; Prednisone; Rosiglitazone maleate; Codeine sulfate; and Tetanus-diphtheria toxoids td   Review of  Systems Review of Systems  Constitutional: Positive for activity change, appetite change and fatigue. Negative for chills and fever.  HENT: Negative for ear pain and sore throat.   Eyes: Negative for pain and visual disturbance.  Respiratory: Negative for cough and shortness of breath.   Cardiovascular: Negative for chest pain and palpitations.  Gastrointestinal: Negative for abdominal pain and vomiting.  Genitourinary: Negative for dysuria and hematuria.  Musculoskeletal: Positive for back pain and gait problem. Negative for arthralgias, joint swelling and myalgias.  Skin: Negative for color change and rash.  Neurological: Positive for syncope. Negative for seizures.  All other systems reviewed and are negative.    Physical Exam Updated Vital Signs BP 104/69   Pulse 95   Temp 99.1 F (37.3 C) (Oral)   Resp 18   SpO2 97%   Physical Exam  Constitutional: She appears well-developed and well-nourished. No distress.  HENT:  Head: Normocephalic and atraumatic.  Eyes: Conjunctivae are normal.  Neck: Neck supple.  Cardiovascular: Normal rate and regular rhythm.  No murmur heard. Pulmonary/Chest: Effort normal and breath sounds normal. No respiratory distress.  Abdominal: Soft. There is no tenderness.  Musculoskeletal: She exhibits no edema.       Left hip: She exhibits tenderness.  Neurological: She is alert.  Skin: Skin is warm and dry.  Psychiatric: She has a normal mood and affect.  Nursing note and vitals reviewed.  ED Treatments / Results  Labs (all labs ordered are listed, but only abnormal results are displayed) Labs Reviewed  CBC WITH DIFFERENTIAL/PLATELET - Abnormal; Notable for the following components:      Result Value   WBC 11.0 (*)    Neutro Abs 9.7 (*)    All other components within normal limits  COMPREHENSIVE METABOLIC PANEL - Abnormal; Notable for the following components:   Sodium 131 (*)    Potassium 2.8 (*)    Chloride 95 (*)    Glucose, Bld 226  (*)    Creatinine, Ser 1.37 (*)    Calcium 8.6 (*)    Albumin 2.6 (*)    AST 67 (*)    Alkaline Phosphatase 138 (*)    Total Bilirubin 1.7 (*)    GFR calc non Af Amer 41 (*)    GFR calc Af Amer 47 (*)    All other components within normal limits  I-STAT CHEM 8, ED -  Abnormal; Notable for the following components:   Sodium 132 (*)    Potassium 3.4 (*)    Chloride 97 (*)    BUN 25 (*)    Creatinine, Ser 1.20 (*)    Glucose, Bld 225 (*)    Calcium, Ion 1.02 (*)    All other components within normal limits  URINALYSIS, ROUTINE W REFLEX MICROSCOPIC  BASIC METABOLIC PANEL  CBC  I-STAT TROPONIN, ED    EKG EKG Interpretation  Date/Time:  Monday August 18 2018 15:42:52 EST Ventricular Rate:  102 PR Interval:    QRS Duration: 110 QT Interval:  444 QTC Calculation: 579 R Axis:   56 Text Interpretation:  Sinus tachycardia Low voltage, precordial leads Borderline repolarization abnormality Prolonged QT interval improved from Aflutter to sinus rhythm Confirmed by Merrily Pew [phone]) on 08/18/2018 4:37:15 PM   Radiology Dg Chest 2 View  Result Date: 08/18/2018 CLINICAL DATA:  Syncope EXAM: CHEST - 2 VIEW COMPARISON:  05/22/2018, 12/28/2017 FINDINGS: No acute opacity or pleural effusion. Cardiomediastinal silhouette within normal limits. Aortic atherosclerosis. No pneumothorax. IMPRESSION: No active cardiopulmonary disease. Electronically Signed   By: Donavan Foil M.D.   On: 08/18/2018 17:34   Dg Thoracic Spine 2 View  Result Date: 08/18/2018 CLINICAL DATA:  Midthoracic spine pain. Multiple falls in the last 3 days. EXAM: THORACIC SPINE 2 VIEWS COMPARISON:  Chest x-ray dated 05/22/2018 FINDINGS: There is no evidence of thoracic spine fracture. Slight thoracolumbar scoliosis. No other significant bone abnormalities are identified. IMPRESSION: No significant abnormality of the thoracic spine. Electronically Signed   By: Lorriane Shire M.D.   On: 08/18/2018 17:37   Ct Head Wo  Contrast  Result Date: 08/18/2018 CLINICAL DATA:  62 year old female status post fall backwards this morning striking head on floor. EXAM: CT HEAD WITHOUT CONTRAST CT CERVICAL SPINE WITHOUT CONTRAST TECHNIQUE: Multidetector CT imaging of the head and cervical spine was performed following the standard protocol without intravenous contrast. Multiplanar CT image reconstructions of the cervical spine were also generated. COMPARISON:  Head CT 05/22/2018. Brain MRI, head and neck MRA 12/29/2017. FINDINGS: CT HEAD FINDINGS Brain: No midline shift, ventriculomegaly, mass effect, evidence of mass lesion, intracranial hemorrhage or evidence of cortically based acute infarction. Gray-white matter differentiation is within normal limits throughout the brain. Vascular: Calcified atherosclerosis at the skull base. Skull: Stable and intact. Sinuses/Orbits: Visualized paranasal sinuses and mastoids are stable and well pneumatized. Other: No scalp soft tissue injury identified. Negative orbits. CT CERVICAL SPINE FINDINGS Alignment: Relatively preserved cervical lordosis. Cervicothoracic junction alignment is within normal limits. Bilateral posterior element alignment is within normal limits. Skull base and vertebrae: Visualized skull base is intact. No atlanto-occipital dissociation. No cervical spine fracture identified. Small C7 cervical ribs (normal variant). Soft tissues and spinal canal: No prevertebral fluid or swelling. No visible canal hematoma. Negative noncontrast neck soft tissues aside from calcified carotid atherosclerosis. Disc levels: Mild to moderate upper cervical facet hypertrophy primarily on the left. Lower cervical disc and endplate degeneration, mild if any lower cervical spinal stenosis suspected. Upper chest: Visible upper thoracic levels appear intact. Negative lung apices. IMPRESSION: 1. No acute traumatic injury identified in the head or cervical spine. 2. Stable and normal for age non contrast CT  appearance of the brain. Electronically Signed   By: Genevie Ann M.D.   On: 08/18/2018 18:34   Ct Cervical Spine Wo Contrast  Result Date: 08/18/2018 CLINICAL DATA:  62 year old female status post fall backwards this morning striking head on floor. EXAM: CT  HEAD WITHOUT CONTRAST CT CERVICAL SPINE WITHOUT CONTRAST TECHNIQUE: Multidetector CT imaging of the head and cervical spine was performed following the standard protocol without intravenous contrast. Multiplanar CT image reconstructions of the cervical spine were also generated. COMPARISON:  Head CT 05/22/2018. Brain MRI, head and neck MRA 12/29/2017. FINDINGS: CT HEAD FINDINGS Brain: No midline shift, ventriculomegaly, mass effect, evidence of mass lesion, intracranial hemorrhage or evidence of cortically based acute infarction. Gray-white matter differentiation is within normal limits throughout the brain. Vascular: Calcified atherosclerosis at the skull base. Skull: Stable and intact. Sinuses/Orbits: Visualized paranasal sinuses and mastoids are stable and well pneumatized. Other: No scalp soft tissue injury identified. Negative orbits. CT CERVICAL SPINE FINDINGS Alignment: Relatively preserved cervical lordosis. Cervicothoracic junction alignment is within normal limits. Bilateral posterior element alignment is within normal limits. Skull base and vertebrae: Visualized skull base is intact. No atlanto-occipital dissociation. No cervical spine fracture identified. Small C7 cervical ribs (normal variant). Soft tissues and spinal canal: No prevertebral fluid or swelling. No visible canal hematoma. Negative noncontrast neck soft tissues aside from calcified carotid atherosclerosis. Disc levels: Mild to moderate upper cervical facet hypertrophy primarily on the left. Lower cervical disc and endplate degeneration, mild if any lower cervical spinal stenosis suspected. Upper chest: Visible upper thoracic levels appear intact. Negative lung apices. IMPRESSION: 1. No  acute traumatic injury identified in the head or cervical spine. 2. Stable and normal for age non contrast CT appearance of the brain. Electronically Signed   By: Genevie Ann M.D.   On: 08/18/2018 18:34   Dg Hip Unilat W Or Wo Pelvis 2-3 Views Left  Result Date: 08/18/2018 CLINICAL DATA:  LEFT hip pain after fall. EXAM: DG HIP (WITH OR WITHOUT PELVIS) 2-3V LEFT COMPARISON:  None. FINDINGS: There is no evidence of hip fracture or dislocation. There is no evidence of arthropathy or other focal bone abnormality. Osteopenia. RIGHT nephroureteral stent with proximal retaining loop projecting central in pelvis. IMPRESSION: Negative. If there is high clinical suspicion for occult hip fracture or the patient refuses to bear weight, consider further evaluation with MRI. Although CT is expeditious, evidence is lacking regarding accuracy of CT over plain film radiography. Electronically Signed   By: Elon Alas M.D.   On: 08/18/2018 19:56    Procedures Procedures (including critical care time)  Medications Ordered in ED Medications  oxyCODONE ER C12A 9 mg (has no administration in time range)  atorvastatin (LIPITOR) tablet 40 mg (has no administration in time range)  Insulin Detemir (LEVEMIR) FlexPen 15 Units (has no administration in time range)  apixaban (ELIQUIS) tablet 5 mg (has no administration in time range)  cyclobenzaprine (FLEXERIL) tablet 10 mg (has no administration in time range)  albuterol (PROVENTIL HFA;VENTOLIN HFA) 108 (90 Base) MCG/ACT inhaler 2 puff (has no administration in time range)  acetaminophen (TYLENOL) tablet 650 mg (has no administration in time range)    Or  acetaminophen (TYLENOL) suppository 650 mg (has no administration in time range)  insulin aspart (novoLOG) injection 0-9 Units (has no administration in time range)  0.9 %  sodium chloride infusion (has no administration in time range)  sodium chloride 0.9 % bolus 1,000 mL (0 mLs Intravenous Stopped 08/18/18 1748)   sodium chloride 0.9 % bolus 1,000 mL (1,000 mLs Intravenous New Bag/Given 08/18/18 1851)     Initial Impression / Assessment and Plan / ED Course  I have reviewed the triage vital signs and the nursing notes.  Pertinent labs & imaging results that were available during  my care of the patient were reviewed by me and considered in my medical decision making (see chart for details).     Patient is a 62 year old female with past medical history significant for hypertension, A. fib, insulin-dependent diabetes presenting for recurrent falls and hypotension.  Upon arrival, patient is no acute distress resting comfortably in bed.  Physical exam shows mild ecchymoses over left hip but no difficulty with palpation of other musculoskeletal areas.  CT scan of head and neck were unremarkable.  X-ray of back, hip, chest were unremarkable.  Labs show patient is dehydrated.  2 L 1 NS was given, patient was fluid responsive.  However when she was checked for orthostatics she became acutely dizzy when asked to stand up and was unable to tolerate.  Upon reassessment, patient was given an additional liter of normal saline and orthostatics were then again positive.  Patient will be admitted for acute dehydration and further evaluation and management.  Final Clinical Impressions(s) / ED Diagnoses   Final diagnoses:  Fall, initial encounter  Vasovagal syncope  Orthostatic hypotension    ED Discharge Orders    None       Erskine Squibb, MD 08/18/18 2136    Merrily Pew, MD 08/18/18 [id number]

## 2018-08-18 NOTE — ED Provider Notes (Addendum)
I saw and evaluated the patient, reviewed the resident's note and I agree with the findings and plan with the following exceptions.   Patient here with multiple falls and weakness. Had been noncompliant with meds until last week. Went to PCP today. Found to have low BP, sent here.  heee appears well. Initially slightly tachycardic. Has neck, ear and back pain.  No cervical spine tenderness, DOES have upper thoracic spine tenderness but no Lumbar spine tenderness.  No tenderness or pain with palpation and full ROM of all joints in upper and lower extremities.  No ecchymosis or other signs of trauma on back or extremities.  No Pain with AP or lateral compression of ribs.  Mild right sided Paracervical ttp but no lower paraspinal ttp.  Bruise to left ear. Mild heart murmur.  Tachypnea on VS but appears improved.   CRITICAL CARE Performed by: Merrily Pew Total critical care time: 35 minutes Critical care time was exclusive of separately billable procedures and treating other patients. Critical care was necessary to treat or prevent imminent or life-threatening deterioration. Critical care was time spent personally by me on the following activities: development of treatment plan with patient and/or surrogate as well as nursing, discussions with consultants, evaluation of patient's response to treatment, examination of patient, obtaining history from patient or surrogate, ordering and performing treatments and interventions, ordering and review of laboratory studies, ordering and review of radiographic studies, pulse oximetry and re-evaluation of patient's condition.    EKG Interpretation  Date/Time:  Monday August 18 2018 15:42:52 EST Ventricular Rate:  102 PR Interval:    QRS Duration: 110 QT Interval:  444 QTC Calculation: 579 R Axis:   56 Text Interpretation:  Sinus tachycardia Low voltage, precordial leads Borderline repolarization abnormality Prolonged QT interval improved from  Aflutter to sinus rhythm Confirmed by Merrily Pew [phone]) on 08/18/2018 4:37:15 PM         [name], Corene Cornea, MD 08/19/18 0008    Merrily Pew, MD 08/19/18 0009    Merrily Pew, MD 09/15/18 [id number]

## 2018-08-18 NOTE — ED Notes (Signed)
To MRI

## 2018-08-18 NOTE — ED Notes (Signed)
RN assisted pt in calling her husband, no answer.  Will try again.

## 2018-08-18 NOTE — ED Notes (Signed)
Pt states she does not have pacemaker

## 2018-08-18 NOTE — H&P (Signed)
History and Physical    [name] VWP:[id number] DOB: June 28, 1956 DOA: 08/18/2018  PCP: Rita Koch, NP  Patient coming from: Home.  Chief Complaint: Falls  HPI: [name] is a 62 y.o. female with history of diabetes mellitus type 2, paroxysmal atrial fibrillation, COPD, chronic pain, hypothyroidism, hyperlipidemia who has not been compliant with her medication and was recently admitted in September 3 months ago for DKA had followed up with her primary care physician recently last week 4 days ago and was restarted on her medications.  Patient states over the last 3 days patient had at least 6 falls in the last 1 was this morning around 3 AM when patient states she was walking in her house when she suddenly lost consciousness and hit her head.  Due to frequent falls patient was brought to the ER.  ED Course: In the ER patient was found to have orthostatic hypotension.  CT head and C-spine did not show anything acute.  EKG shows sinus tachycardia.  Patient was given 2 L normal saline bolus for the orthostasis.  On my exam patient complains of abdominal discomfort.  Also complains of left hip pain.  X-rays do not show any definite fracture.  Review of Systems: As per HPI, rest all negative.   Past Medical History:  Diagnosis Date  . Acute encephalopathy 05/22/2018  . Allergy   . Anxiety   . Ascites   . C. difficile colitis 07/10/2015  . Cancer (HCC)    HX OF CANCER OF UTERUS   . Cirrhosis of liver not due to alcohol ([place]) 2016  . Degenerative disk disease   . Diverticulitis   . Gastroparesis   . GERD (gastroesophageal reflux disease)   . History of hiatal hernia   . Hypertension   . Hypothyroid   . Hypothyroidism due to amiodarone   . Ileus ([place]) 08/01/2015  . Intussusception intestine ([place]) 05/2015  . Orthostatic hypotension   . PAF (paroxysmal atrial fibrillation) ([place]) 03/2015   a. new onset 03/2015 in setting of intractable N/V; b. on Eliquis 5 mg bid; c.  CHADSVASc 4 (DM, TIA x 2, female)  . Pancreatitis   . Pneumonia 11/14/2015  . Right ureteral stone 07/14/2016  . Sick sinus syndrome ([place])   . Stomach ulcer   . Stroke [facility])    with minimal left sided weakness  . Syncope 01/2015  . Syncope due to orthostatic hypotension 05/18/2015  . Tachyarrhythmia 01/10/2016  . TIA (transient ischemic attack) 02/2015  . Type 1 diabetes ([place])    on levemir  . UTERINE CANCER, HX OF 03/27/2007   Qualifier: Diagnosis of  By: Maxie Better FNP, Rosalita Levan   . UTI (urinary tract infection) 05/22/2018    Past Surgical History:  Procedure Laterality Date  . ABDOMINAL HYSTERECTOMY    . CARDIAC CATHETERIZATION N/A 01/12/2016   Procedure: Left Heart Cath and Coronary Angiography;  Surgeon: Wellington Hampshire, MD;  Location: [place] [facility];  Service: Cardiovascular;  Laterality: N/A;  . CHOLECYSTECTOMY    . CYSTOSCOPY/URETEROSCOPY/HOLMIUM LASER Right 07/14/2016   Procedure: CYSTOSCOPY/URETEROSCOPY/HOLMIUM LASER;  Surgeon: Alexis Frock, MD;  Location: [facility] ORS;  Service: Urology;  Laterality: Right;  . ESOPHAGOGASTRODUODENOSCOPY N/A 04/04/2015   Procedure: ESOPHAGOGASTRODUODENOSCOPY (EGD);  Surgeon: Hulen Luster, MD;  Location[facility] END[facility]COPY;  Service: Endoscopy;  Laterality: N/A;  . ESOPHAGOGASTRODUODENOSCOPY N/A 12/28/2017   Procedure: ESOPHAGOGASTRODUODENOSCOPY (EGD);  Surgeon: Lin Landsman, MD;  Location: [facility] ENDOSCOPY;  Service: Gastroenterology;  Laterality: N/A;  .  ESOPHAGOGASTRODUODENOSCOPY (EGD) WITH PROPOFOL N/A 01/18/2016   Procedure: ESOPHAGOGASTRODUODENOSCOPY (EGD) WITH PROPOFOL;  Surgeon: Lucilla Lame, MD;  Location: [facility] ENDOSCOPY;  Service: Endoscopy;  Laterality: N/A;  . FLEXIBLE SIGMOIDOSCOPY N/A 01/18/2016   Procedure: FLEXIBLE SIGMOIDOSCOPY;  Surgeon: Lucilla Lame, MD;  Location: [facility] ENDOSCOPY;  Service: Endoscopy;  Laterality: N/A;  . HERNIA REPAIR       reports that she has quit smoking. Her smoking use included cigarettes. She has never  used smokeless tobacco. She reports that she does not drink alcohol or use drugs.  Allergies  Allergen Reactions  . Hydrocodone Other (See Comments)    Pt states that this medication caused cirrhosis of the liver.    . Aspirin   . Erythromycin Other (See Comments)    Reaction:  Fever   . Prednisone Other (See Comments)    Reaction:  Unknown   . Rosiglitazone Maleate Swelling  . Codeine Sulfate Rash  . Tetanus-Diphtheria Toxoids Td Rash and Other (See Comments)    Reaction:  Fever     Family History  Problem Relation Age of Onset  . Hypertension Mother   . CAD Sister   . Heart attack Sister        Deceased 09-Nov-2014  . CAD Brother     Prior to Admission medications   Medication Sig Start Date End Date Taking? Authorizing Provider  albuterol (PROVENTIL HFA;VENTOLIN HFA) 108 (90 Base) MCG/ACT inhaler Inhale 2 puffs into the lungs every 6 (six) hours as needed for wheezing or shortness of breath. 12/17/17  Yes Sainani, Belia Heman, MD  apixaban (ELIQUIS) 5 MG TABS tablet Take 1 tablet (5 mg total) by mouth 2 (two) times daily. 08/14/18 09/13/18 Yes Rita Koch, NP  atorvastatin (LIPITOR) 40 MG tablet Take 1 tablet (40 mg total) by mouth daily. For cholesterol. 08/14/18 09/13/18 Yes Rita Koch, NP  cyclobenzaprine (FLEXERIL) 10 MG tablet Take 10 mg by mouth 3 (three) times daily as needed for muscle spasms. 07/26/18  Yes [provider]  Insulin Detemir (LEVEMIR FLEXPEN) 100 UNIT/ML Pen Inject 15 Units into the skin 2 (two) times daily. 08/14/18  Yes Rita Koch, NP  metoprolol tartrate (LOPRESSOR) 25 MG tablet Take 1 tablet (25 mg total) by mouth 2 (two) times daily. For blood pressure. 08/14/18 09/13/18 Yes Rita Koch, NP  blood glucose meter kit and supplies KIT Dispense based on patient and insurance preference. Use up to four times daily as directed. (FOR ICD-9 250.00, 250.01). 08/14/18   Rita Koch, NP  budesonide-formoterol [facility]) 160-4.5 MCG/ACT  inhaler Inhale 2 puffs into the lungs 2 (two) times daily. Patient not taking: Reported on 08/14/2018 12/17/17   Henreitta Leber, MD  lisinopril (PRINIVIL,ZESTRIL) 5 MG tablet Take 1 tablet by mouth every morning for kidney protection against diabetes. Patient not taking: Reported on 08/14/2018 12/17/17   Henreitta Leber, MD  oxyCODONE ER [facility] ER) 9 MG C12A Take 9 mg by mouth every 8 (eight) hours.    [provider]  potassium chloride 20 MEQ TBCR Take 20 mEq by mouth daily. Patient not taking: Reported on 08/14/2018 12/30/17   Saundra Shelling, MD    Physical Exam: Vitals:   08/18/18 1730 08/18/18 1745 08/18/18 1849 08/18/18 11/09/33  BP: 119/73 100/65 100/68 104/69  Pulse: 91 91 95   Resp:  18 18   Temp:      TempSrc:      SpO2: 100% 100% 97%       Constitutional: Moderately  built and nourished. Vitals:   08/18/18 1730 08/18/18 1745 08/18/18 1849 08/18/18 2035  BP: 119/73 100/65 100/68 104/69  Pulse: 91 91 95   Resp:  18 18   Temp:      TempSrc:      SpO2: 100% 100% 97%    Eyes: Anicteric no pallor. ENMT: No discharge from the ears eyes nose or mouth. Neck: No mass felt.  No neck rigidity. Respiratory: No rhonchi or crepitations. Cardiovascular: S1-S2 heard no murmurs appreciated. Abdomen: Soft nontender bowel sounds present. Musculoskeletal: No edema.  No joint effusion. Skin: No rash. Neurologic: Alert awake oriented to time place and person.  Moves all extremities. Psychiatric: Appears normal.  Normal affect.   Labs on Admission: I have personally reviewed following labs and imaging studies  CBC: Recent Labs  Lab 08/14/18 1036 08/18/18 1546 08/18/18 1605  WBC 10.5 11.0*  --   NEUTROABS  --  9.7*  --   HGB 12.5 12.7 14.6  HCT 38.6 40.6 43.0  MCV 86.1 86.0  --   PLT 236.0 202  --    Basic Metabolic Panel: Recent Labs  Lab 08/14/18 1036 08/18/18 1546 08/18/18 1605  NA 127* 131* 132*  K 3.0* 2.8* 3.4*  CL 92* 95* 97*  CO2 22 24  --   GLUCOSE  459* 226* 225*  BUN 17 18 25*  CREATININE 1.31* 1.37* 1.20*  CALCIUM 8.5 8.6*  --    GFR: Estimated Creatinine Clearance: 39 mL/min (A) (by C-G formula based on SCr of 1.2 mg/dL (H)). Liver Function Tests: Recent Labs  Lab 08/14/18 1036 08/18/18 1546  AST 19 67*  ALT 14 34  ALKPHOS 129* 138*  BILITOT 1.0 1.7*  PROT 7.7 7.8  ALBUMIN 3.2* 2.6*   No results for input(s): LIPASE, AMYLASE in the last 168 hours. No results for input(s): AMMONIA in the last 168 hours. Coagulation Profile: No results for input(s): INR, PROTIME in the last 168 hours. Cardiac Enzymes: No results for input(s): CKTOTAL, CKMB, CKMBINDEX, TROPONINI in the last 168 hours. BNP (last 3 results) No results for input(s): PROBNP in the last 8760 hours. HbA1C: No results for input(s): HGBA1C in the last 72 hours. CBG: No results for input(s): GLUCAP in the last 168 hours. Lipid Profile: No results for input(s): CHOL, HDL, LDLCALC, TRIG, CHOLHDL, LDLDIRECT in the last 72 hours. Thyroid Function Tests: No results for input(s): TSH, T4TOTAL, FREET4, T3FREE, THYROIDAB in the last 72 hours. Anemia Panel: No results for input(s): VITAMINB12, FOLATE, FERRITIN, TIBC, IRON, RETICCTPCT in the last 72 hours. Urine analysis:    Component Value Date/Time   COLORURINE AMBER (A) 05/22/2018 0640   APPEARANCEUR CLOUDY (A) 05/22/2018 0640   APPEARANCEUR Clear 10/08/2014 0144   LABSPEC 1.017 05/22/2018 0640   LABSPEC 1.033 10/08/2014 0144   PHURINE 5.0 05/22/2018 0640   GLUCOSEU >=500 (A) 05/22/2018 0640   GLUCOSEU >=500 10/08/2014 0144   HGBUR LARGE (A) 05/22/2018 0640   BILIRUBINUR NEGATIVE 05/22/2018 0640   BILIRUBINUR Negative 10/08/2014 0144   KETONESUR 5 (A) 05/22/2018 0640   PROTEINUR 100 (A) 05/22/2018 0640   UROBILINOGEN 0.2 05/27/2015 1936   NITRITE NEGATIVE 05/22/2018 0640   LEUKOCYTESUR LARGE (A) 05/22/2018 0640   LEUKOCYTESUR Negative 10/08/2014 0144   Sepsis  Labs: @LABRCNTIP (procalcitonin:4,lacticidven:4) )No results found for this or any previous visit (from the past 240 hour(s)).   Radiological Exams on Admission: Dg Chest 2 View  Result Date: 08/18/2018 CLINICAL DATA:  Syncope EXAM: CHEST - 2 VIEW COMPARISON:  05/22/2018, 12/28/2017 FINDINGS: No acute opacity or pleural effusion. Cardiomediastinal silhouette within normal limits. Aortic atherosclerosis. No pneumothorax. IMPRESSION: No active cardiopulmonary disease. Electronically Signed   By: Donavan Foil M.D.   On: 08/18/2018 17:34   Dg Thoracic Spine 2 View  Result Date: 08/18/2018 CLINICAL DATA:  Midthoracic spine pain. Multiple falls in the last 3 days. EXAM: THORACIC SPINE 2 VIEWS COMPARISON:  Chest x-ray dated 05/22/2018 FINDINGS: There is no evidence of thoracic spine fracture. Slight thoracolumbar scoliosis. No other significant bone abnormalities are identified. IMPRESSION: No significant abnormality of the thoracic spine. Electronically Signed   By: Lorriane Shire M.D.   On: 08/18/2018 17:37   Ct Head Wo Contrast  Result Date: 08/18/2018 CLINICAL DATA:  62 year old female status post fall backwards this morning striking head on floor. EXAM: CT HEAD WITHOUT CONTRAST CT CERVICAL SPINE WITHOUT CONTRAST TECHNIQUE: Multidetector CT imaging of the head and cervical spine was performed following the standard protocol without intravenous contrast. Multiplanar CT image reconstructions of the cervical spine were also generated. COMPARISON:  Head CT 05/22/2018. Brain MRI, head and neck MRA 12/29/2017. FINDINGS: CT HEAD FINDINGS Brain: No midline shift, ventriculomegaly, mass effect, evidence of mass lesion, intracranial hemorrhage or evidence of cortically based acute infarction. Gray-white matter differentiation is within normal limits throughout the brain. Vascular: Calcified atherosclerosis at the skull base. Skull: Stable and intact. Sinuses/Orbits: Visualized paranasal sinuses and mastoids are  stable and well pneumatized. Other: No scalp soft tissue injury identified. Negative orbits. CT CERVICAL SPINE FINDINGS Alignment: Relatively preserved cervical lordosis. Cervicothoracic junction alignment is within normal limits. Bilateral posterior element alignment is within normal limits. Skull base and vertebrae: Visualized skull base is intact. No atlanto-occipital dissociation. No cervical spine fracture identified. Small C7 cervical ribs (normal variant). Soft tissues and spinal canal: No prevertebral fluid or swelling. No visible canal hematoma. Negative noncontrast neck soft tissues aside from calcified carotid atherosclerosis. Disc levels: Mild to moderate upper cervical facet hypertrophy primarily on the left. Lower cervical disc and endplate degeneration, mild if any lower cervical spinal stenosis suspected. Upper chest: Visible upper thoracic levels appear intact. Negative lung apices. IMPRESSION: 1. No acute traumatic injury identified in the head or cervical spine. 2. Stable and normal for age non contrast CT appearance of the brain. Electronically Signed   By: Genevie Ann M.D.   On: 08/18/2018 18:34   Ct Cervical Spine Wo Contrast  Result Date: 08/18/2018 CLINICAL DATA:  62 year old female status post fall backwards this morning striking head on floor. EXAM: CT HEAD WITHOUT CONTRAST CT CERVICAL SPINE WITHOUT CONTRAST TECHNIQUE: Multidetector CT imaging of the head and cervical spine was performed following the standard protocol without intravenous contrast. Multiplanar CT image reconstructions of the cervical spine were also generated. COMPARISON:  Head CT 05/22/2018. Brain MRI, head and neck MRA 12/29/2017. FINDINGS: CT HEAD FINDINGS Brain: No midline shift, ventriculomegaly, mass effect, evidence of mass lesion, intracranial hemorrhage or evidence of cortically based acute infarction. Gray-white matter differentiation is within normal limits throughout the brain. Vascular: Calcified  atherosclerosis at the skull base. Skull: Stable and intact. Sinuses/Orbits: Visualized paranasal sinuses and mastoids are stable and well pneumatized. Other: No scalp soft tissue injury identified. Negative orbits. CT CERVICAL SPINE FINDINGS Alignment: Relatively preserved cervical lordosis. Cervicothoracic junction alignment is within normal limits. Bilateral posterior element alignment is within normal limits. Skull base and vertebrae: Visualized skull base is intact. No atlanto-occipital dissociation. No cervical spine fracture identified. Small C7 cervical ribs (normal variant). Soft tissues and spinal  canal: No prevertebral fluid or swelling. No visible canal hematoma. Negative noncontrast neck soft tissues aside from calcified carotid atherosclerosis. Disc levels: Mild to moderate upper cervical facet hypertrophy primarily on the left. Lower cervical disc and endplate degeneration, mild if any lower cervical spinal stenosis suspected. Upper chest: Visible upper thoracic levels appear intact. Negative lung apices. IMPRESSION: 1. No acute traumatic injury identified in the head or cervical spine. 2. Stable and normal for age non contrast CT appearance of the brain. Electronically Signed   By: Genevie Ann M.D.   On: 08/18/2018 18:34   Dg Hip Unilat W Or Wo Pelvis 2-3 Views Left  Result Date: 08/18/2018 CLINICAL DATA:  LEFT hip pain after fall. EXAM: DG HIP (WITH OR WITHOUT PELVIS) 2-3V LEFT COMPARISON:  None. FINDINGS: There is no evidence of hip fracture or dislocation. There is no evidence of arthropathy or other focal bone abnormality. Osteopenia. RIGHT nephroureteral stent with proximal retaining loop projecting central in pelvis. IMPRESSION: Negative. If there is high clinical suspicion for occult hip fracture or the patient refuses to bear weight, consider further evaluation with MRI. Although CT is expeditious, evidence is lacking regarding accuracy of CT over plain film radiography. Electronically Signed    By: Elon Alas M.D.   On: 08/18/2018 19:56    EKG: Independently reviewed.  Sinus tachycardia.  Assessment/Plan Principal Problem:   Orthostatic hypotension Active Problems:   Chronic pain syndrome   Essential hypertension   Prolonged QT interval   Uncontrolled type 2 diabetes mellitus (HCC)   Cryptogenic cirrhosis (HCC)   Paroxysmal atrial fibrillation (HCC)   Tachy-brady syndrome (HCC)   Normochromic normocytic anemia   ARF (acute renal failure) ([place])    1. Orthostatic hypotension -hold antihypertensives for now and gently hydrate.  Check cortisol levels. 2. Abdominal pain -will check CT abdomen pelvis since patient has had recurrent falls.  Further management based on the CAT scan findings. 3. Left hip pain will check MRI. 4. History of paroxysmal atrial fibrillation on apixaban.  Metoprolol is presently on hold due to orthostatic changes but will restart if blood pressure improves at a lower dose. 5. Hypertension holding antihypertensives due to orthostatic changes. 6. Diabetes mellitus type 2 on Levemir which will be continued. 7. Hypokalemia replace recheck check magnesium. 8. Chronic kidney disease stage III follow metabolic panel. 9. Cryptogenic cirrhosis appears compensated.   DVT prophylaxis: Was on apixaban holding apixaban due to CAT scan finding shows possible hematoma. Code Status: Full code. Family Communication: Discussed with patient. Disposition Plan: To be determined. Consults called: None. Admission status: Observation.   Rise Patience MD [facility] Pager [phone].  If 7PM-7AM, please contact night-coverage [email or web address] Password [facility]  08/18/2018, 9:37 PM

## 2018-08-18 NOTE — ED Notes (Signed)
Pt returns to CT scan.

## 2018-08-19 ENCOUNTER — Other Ambulatory Visit: Payer: Self-pay | Admitting: Internal Medicine

## 2018-08-19 ENCOUNTER — Other Ambulatory Visit: Payer: Self-pay

## 2018-08-19 DIAGNOSIS — Z9119 Patient's noncompliance with other medical treatment and regimen: Secondary | ICD-10-CM | POA: Diagnosis not present

## 2018-08-19 DIAGNOSIS — I480 Paroxysmal atrial fibrillation: Secondary | ICD-10-CM | POA: Diagnosis not present

## 2018-08-19 DIAGNOSIS — E1165 Type 2 diabetes mellitus with hyperglycemia: Secondary | ICD-10-CM | POA: Diagnosis not present

## 2018-08-19 DIAGNOSIS — I951 Orthostatic hypotension: Secondary | ICD-10-CM | POA: Diagnosis not present

## 2018-08-19 DIAGNOSIS — N1330 Unspecified hydronephrosis: Secondary | ICD-10-CM | POA: Diagnosis not present

## 2018-08-19 DIAGNOSIS — I10 Essential (primary) hypertension: Secondary | ICD-10-CM

## 2018-08-19 DIAGNOSIS — T83192A Other mechanical complication of urinary stent, initial encounter: Secondary | ICD-10-CM | POA: Diagnosis not present

## 2018-08-19 LAB — BASIC METABOLIC PANEL
Anion gap: 12 (ref 5–15)
BUN: 18 mg/dL (ref 8–23)
CO2: 22 mmol/L (ref 22–32)
Calcium: 7.8 mg/dL — ABNORMAL LOW (ref 8.9–10.3)
Chloride: 102 mmol/L (ref 98–111)
Creatinine, Ser: 1.13 mg/dL — ABNORMAL HIGH (ref 0.44–1.00)
GFR calc non Af Amer: 51 mL/min — ABNORMAL LOW (ref >60)
GFR, EST AFRICAN AMERICAN: 59 mL/min — AB (ref >60)
Glucose, Bld: 89 mg/dL (ref 70–99)
Potassium: 2.6 mmol/L — CL (ref 3.5–5.1)
Sodium: 136 mmol/L (ref 135–145)

## 2018-08-19 LAB — URINALYSIS, ROUTINE W REFLEX MICROSCOPIC
Glucose, UA: NEGATIVE mg/dL
Ketones, ur: 15 mg/dL — AB
Nitrite: NEGATIVE
Protein, ur: 100 mg/dL — AB
pH: 5 (ref 5.0–8.0)

## 2018-08-19 LAB — URINALYSIS, MICROSCOPIC (REFLEX)
RBC / HPF: >50 RBC/hpf (ref 0–5)
WBC, UA: >50 WBC/hpf (ref 0–5)

## 2018-08-19 LAB — CBC
HCT: 35.4 % — ABNORMAL LOW (ref 36.0–46.0)
Hemoglobin: 11 g/dL — ABNORMAL LOW (ref 12.0–15.0)
MCH: 26.6 pg (ref 26.0–34.0)
MCHC: 31.1 g/dL (ref 30.0–36.0)
MCV: 85.7 fL (ref 80.0–100.0)
NRBC: 0 % (ref 0.0–0.2)
Platelets: 158 10*3/uL (ref 150–400)
RBC: 4.13 MIL/uL (ref 3.87–5.11)
RDW: 15.1 % (ref 11.5–15.5)
WBC: 9.1 10*3/uL (ref 4.0–10.5)

## 2018-08-19 LAB — MAGNESIUM: Magnesium: 1.5 mg/dL — ABNORMAL LOW (ref 1.7–2.4)

## 2018-08-19 LAB — CBG MONITORING, ED
Glucose-Capillary: 102 mg/dL — ABNORMAL HIGH (ref 70–99)
Glucose-Capillary: 108 mg/dL — ABNORMAL HIGH (ref 70–99)
Glucose-Capillary: 77 mg/dL (ref 70–99)

## 2018-08-19 LAB — GLUCOSE, CAPILLARY
Glucose-Capillary: 91 mg/dL (ref 70–99)
Glucose-Capillary: 83 mg/dL (ref 70–99)
Glucose-Capillary: 205 mg/dL — ABNORMAL HIGH (ref 70–99)

## 2018-08-19 LAB — CORTISOL: Cortisol, Plasma: 16.7 ug/dL

## 2018-08-19 MED ORDER — POTASSIUM CHLORIDE IN NACL 20-0.9 MEQ/L-% IV SOLN
INTRAVENOUS | Status: DC
  Administered 2018-08-19 – 2018-08-20 (×3): via INTRAVENOUS
  Filled 2018-08-19 (×3): qty 1000

## 2018-08-19 MED ORDER — MAGNESIUM SULFATE 2 GM/50ML IV SOLN
2.0000 g | Freq: Once | INTRAVENOUS | Status: AC
  Administered 2018-08-19: 2 g via INTRAVENOUS
  Filled 2018-08-19: qty 50

## 2018-08-19 MED ORDER — METOCLOPRAMIDE HCL 5 MG/ML IJ SOLN
5.0000 mg | Freq: Four times a day (QID) | INTRAMUSCULAR | Status: DC
  Administered 2018-08-19 – 2018-08-20 (×5): 5 mg via INTRAVENOUS
  Filled 2018-08-19 (×5): qty 2

## 2018-08-19 MED ORDER — METOPROLOL TARTRATE 12.5 MG HALF TABLET
12.5000 mg | ORAL_TABLET | Freq: Two times a day (BID) | ORAL | Status: DC
  Administered 2018-08-19 – 2018-08-20 (×2): 12.5 mg via ORAL
  Filled 2018-08-19 (×2): qty 1

## 2018-08-19 MED ORDER — NYSTATIN 100000 UNIT/ML MT SUSP
5.0000 mL | Freq: Four times a day (QID) | OROMUCOSAL | Status: DC
  Administered 2018-08-19 – 2018-08-20 (×6): 500000 [IU] via ORAL
  Filled 2018-08-19 (×6): qty 5

## 2018-08-19 NOTE — ED Notes (Signed)
bfast tray ordered 

## 2018-08-19 NOTE — Progress Notes (Signed)
Inpatient Diabetes Program Recommendations  AACE/ADA: New Consensus Statement on Inpatient Glycemic Control (2019)  Target Ranges:  Prepandial:   less than 140 mg/dL      Peak postprandial:   less than 180 mg/dL (1-2 hours)      Critically ill patients:  140 - 180 mg/dL   Results for [name], [name] (MRN [id number]) as of 08/19/2018 12:39  Ref. Range 08/19/2018 00:38 08/19/2018 05:14 08/19/2018 08:07 08/19/2018 12:07  Glucose-Capillary Latest Ref Range: 70 - 99 mg/dL 102 (H) 108 (H) 77 91  Results for [name], [name] (MRN [id number]) as of 08/19/2018 12:39  Ref. Range 12/14/2017 23:43 12/29/2017 02:12 05/22/2018 11:02 08/14/2018 10:20 08/14/2018 10:20  Hemoglobin A1C Latest Ref Range: 4.0 - 5.6 % 15.5 (H) 14.1 (H) 12.9 (H) Pend 14.6 (A)   Review of Glycemic Control  Outpatient Diabetes medications: Levemir 15 units BID Current orders for Inpatient glycemic control:  Levemir 15 units BID, Novolog 0-9 units TID with meals   Inpatient Diabetes Program Recommendations:  HgbA1C: A1C 14.6% on 08/14/18 at last office visit with [name], NP with [facility]. Patient had reported being out of insulin for 2 weeks prior to visit. Patient was told to take Levemir 15 units BID and stop Novolog. Patient has long history of poor DM control.   Thanks, Barnie Alderman, RN, MSN, CDE Diabetes Coordinator Inpatient Diabetes Program [phone] (Team Pager from 8am to 5pm)

## 2018-08-19 NOTE — Progress Notes (Signed)
CRITICAL VALUE ALERT  Critical Value:  Potassium= 2.6  Date & Time Notied:  08/19/18 1000  Provider Notified: yes  Orders Received/Actions taken: no new orders   Nira Conn, RN made aware

## 2018-08-19 NOTE — ED Notes (Signed)
Pt transferred to hospital bed

## 2018-08-19 NOTE — Progress Notes (Signed)
Progress Note    [name]  QVZ:[id number] DOB: 13-Nov-1955  DOA: 08/18/2018 PCP: [name], NP    Brief Narrative:     Medical records reviewed and are as summarized below:  [name] is an 62 y.o. female with history of diabetes mellitus type 2, paroxysmal atrial fibrillation, COPD, chronic pain, hypothyroidism, hyperlipidemia who has not been compliant with her medication and was recently admitted in September 3 months ago for DKA had followed up with her primary care physician recently last week 4 days ago and was restarted on her medications.  Patient states over the last 3 days patient had at least 6 falls in the last 1 was this morning around 3 AM when patient states she was walking in her house when she suddenly lost consciousness and hit her head.   Assessment/Plan:   Principal Problem:   Orthostatic hypotension Active Problems:   Chronic pain syndrome   Essential hypertension   Prolonged QT interval   Uncontrolled type 2 diabetes mellitus (HCC)   Cryptogenic cirrhosis (HCC)   Paroxysmal atrial fibrillation (HCC)   Tachy-brady syndrome (HCC)   Normochromic normocytic anemia   ARF (acute renal failure) (HCC)   Orthostatic hypotension -IVF -?related to rapid weight loss -check pre albumin nutrition consult -PT consult   2017 stent placement -on imaging: A right ureteral stent is in place. There is moderate right hydronephrosis and hydroureter, raising possibility of stent malfunction. Moderate to marked right perinephric edema and slightly hyperdense thickening or fluid -paged urology to discuss- Dr. Gloriann Loan to review-- was lost to outpatient follow up and never had it removed -continue to await urinalysis (in 05/2018 had yeast on culture)  Weight loss -from 140 in September to 115 now -? Gastroparesis vs re-occurrence of her esophageal candida -trial of nystatin swish and swallow before calling GI  Abdominal pain  -suspect gastroparesis vs  above -HgbA1c is > 14 -poor compliance to medical regimen -in 2017 has gastric emptying study that showed delayed gastric emptying-- has never been on reglan per patient but chart review shows she was prescribed in 2017 after d/c from hospital -trial of IV reglan  Left hip pain -x ray and MRI normal  History of paroxysmal atrial fibrillation  -holding apixaban in case of procedure -  Metoprolol with holding parameters   Hypertension  -holding parameters for metoprolol  Poorly controled Diabetes mellitus type 2 -levemir -SSI -HgbA1c: 14  Hypokalemia/hypomagnesemia -replete along with low magnesium  Chronic kidney disease stage III  -trend  Cryptogenic cirrhosis appears compensated.  Outpatient follow up on abnormal imaging: Previously noted retroperitoneal mass-like densities inferior to the right kidney and adjacent to the right psoas muscle. These have MR signal characteristics similar to fluid in adjacent fluid-filled small bowel loops, suggesting that these may represent seromas. Soft tissue masses cannot be excluded. As previously discussed, these can be re-evaluated with a follow-up CT with contrast.   Family Communication/Anticipated D/C date and plan/Code Status   DVT prophylaxis: hold apixiban for now incase of procedure-- SCDs Code Status: dnr Family Communication: none at bedside Disposition Plan: this patient needs to remain in the hospital for continued medical management and optimization.  She unfortunately has been lost to follow up in the past and does not seem to have the ability to reliably take her medications   Medical Consultants:     Subjective:   Several complaints: 1. Weight loss 2. N/V 3. Pain with swallowing  Objective:    Vitals:  08/19/18 0227 08/19/18 0512 08/19/18 0902 08/19/18 1222  BP: 118/71 108/61  118/73  Pulse: (!) 104 (!) 101  (!) 111  Resp: 16 16  20   Temp:    97.9 F (36.6 C)  TempSrc:    Oral  SpO2: 100% 100%   99%  Weight:   55.4 kg   Height:   5' 3"  (1.6 m)     Intake/Output Summary (Last 24 hours) at 08/19/2018 1309 Last data filed at 08/19/2018 0849 Gross per 24 hour  Intake 558.4 ml  Output -  Net 558.4 ml   Filed Weights   08/19/18 0902  Weight: 55.4 kg    Exam: Older than stated age Frail Pleasant Sinus tachy Poor insight +BS, soft , NT Diminished breath sounds  Data Reviewed:   I have personally reviewed following labs and imaging studies:  Labs: Labs show the following:   Basic Metabolic Panel: Recent Labs  Lab 08/14/18 1036 08/18/18 1546 08/18/18 1605 08/19/18 0854  NA 127* 131* 132* 136  K 3.0* 2.8* 3.4* 2.6*  CL 92* 95* 97* 102  CO2 22 24  --  22  GLUCOSE 459* 226* 225* 89  BUN 17 18 25* 18  CREATININE 1.31* 1.37* 1.20* 1.13*  CALCIUM 8.5 8.6*  --  7.8*  MG  --   --   --  1.5*   GFR Estimated Creatinine Clearance: 41.6 mL/min (A) (by C-G formula based on SCr of 1.13 mg/dL (H)). Liver Function Tests: Recent Labs  Lab 08/14/18 1036 08/18/18 1546  AST 19 67*  ALT 14 34  ALKPHOS 129* 138*  BILITOT 1.0 1.7*  PROT 7.7 7.8  ALBUMIN 3.2* 2.6*   No results for input(s): LIPASE, AMYLASE in the last 168 hours. No results for input(s): AMMONIA in the last 168 hours. Coagulation profile No results for input(s): INR, PROTIME in the last 168 hours.  CBC: Recent Labs  Lab 08/14/18 1036 08/18/18 1546 08/18/18 1605 08/19/18 0854  WBC 10.5 11.0*  --  9.1  NEUTROABS  --  9.7*  --   --   HGB 12.5 12.7 14.6 11.0*  HCT 38.6 40.6 43.0 35.4*  MCV 86.1 86.0  --  85.7  PLT 236.0 202  --  158   Cardiac Enzymes: No results for input(s): CKTOTAL, CKMB, CKMBINDEX, TROPONINI in the last 168 hours. BNP (last 3 results) No results for input(s): PROBNP in the last 8760 hours. CBG: Recent Labs  Lab 08/19/18 0038 08/19/18 0514 08/19/18 0807 08/19/18 1207  GLUCAP 102* 108* 77 91   D-Dimer: No results for input(s): DDIMER in the last 72 hours. Hgb  A1c: No results for input(s): HGBA1C in the last 72 hours. Lipid Profile: No results for input(s): CHOL, HDL, LDLCALC, TRIG, CHOLHDL, LDLDIRECT in the last 72 hours. Thyroid function studies: No results for input(s): TSH, T4TOTAL, T3FREE, THYROIDAB in the last 72 hours.  Invalid input(s): FREET3 Anemia work up: No results for input(s): VITAMINB12, FOLATE, FERRITIN, TIBC, IRON, RETICCTPCT in the last 72 hours. Sepsis Labs: Recent Labs  Lab 08/14/18 1036 08/18/18 1546 08/19/18 0854  WBC 10.5 11.0* 9.1    Microbiology No results found for this or any previous visit (from the past 240 hour(s)).  Procedures and diagnostic studies:  Dg Chest 2 View  Result Date: 08/18/2018 CLINICAL DATA:  Syncope EXAM: CHEST - 2 VIEW COMPARISON:  05/22/2018, 12/28/2017 FINDINGS: No acute opacity or pleural effusion. Cardiomediastinal silhouette within normal limits. Aortic atherosclerosis. No pneumothorax. IMPRESSION: No active cardiopulmonary disease. Electronically  Signed   By: Donavan Foil M.D.   On: 08/18/2018 17:34   Dg Thoracic Spine 2 View  Result Date: 08/18/2018 CLINICAL DATA:  Midthoracic spine pain. Multiple falls in the last 3 days. EXAM: THORACIC SPINE 2 VIEWS COMPARISON:  Chest x-ray dated 05/22/2018 FINDINGS: There is no evidence of thoracic spine fracture. Slight thoracolumbar scoliosis. No other significant bone abnormalities are identified. IMPRESSION: No significant abnormality of the thoracic spine. Electronically Signed   By: Lorriane Shire M.D.   On: 08/18/2018 17:37   Ct Head Wo Contrast  Result Date: 08/18/2018 CLINICAL DATA:  62 year old female status post fall backwards this morning striking head on floor. EXAM: CT HEAD WITHOUT CONTRAST CT CERVICAL SPINE WITHOUT CONTRAST TECHNIQUE: Multidetector CT imaging of the head and cervical spine was performed following the standard protocol without intravenous contrast. Multiplanar CT image reconstructions of the cervical spine were also  generated. COMPARISON:  Head CT 05/22/2018. Brain MRI, head and neck MRA 12/29/2017. FINDINGS: CT HEAD FINDINGS Brain: No midline shift, ventriculomegaly, mass effect, evidence of mass lesion, intracranial hemorrhage or evidence of cortically based acute infarction. Gray-white matter differentiation is within normal limits throughout the brain. Vascular: Calcified atherosclerosis at the skull base. Skull: Stable and intact. Sinuses/Orbits: Visualized paranasal sinuses and mastoids are stable and well pneumatized. Other: No scalp soft tissue injury identified. Negative orbits. CT CERVICAL SPINE FINDINGS Alignment: Relatively preserved cervical lordosis. Cervicothoracic junction alignment is within normal limits. Bilateral posterior element alignment is within normal limits. Skull base and vertebrae: Visualized skull base is intact. No atlanto-occipital dissociation. No cervical spine fracture identified. Small C7 cervical ribs (normal variant). Soft tissues and spinal canal: No prevertebral fluid or swelling. No visible canal hematoma. Negative noncontrast neck soft tissues aside from calcified carotid atherosclerosis. Disc levels: Mild to moderate upper cervical facet hypertrophy primarily on the left. Lower cervical disc and endplate degeneration, mild if any lower cervical spinal stenosis suspected. Upper chest: Visible upper thoracic levels appear intact. Negative lung apices. IMPRESSION: 1. No acute traumatic injury identified in the head or cervical spine. 2. Stable and normal for age non contrast CT appearance of the brain. Electronically Signed   By: Genevie Ann M.D.   On: 08/18/2018 18:34   Ct Cervical Spine Wo Contrast  Result Date: 08/18/2018 CLINICAL DATA:  62 year old female status post fall backwards this morning striking head on floor. EXAM: CT HEAD WITHOUT CONTRAST CT CERVICAL SPINE WITHOUT CONTRAST TECHNIQUE: Multidetector CT imaging of the head and cervical spine was performed following the standard  protocol without intravenous contrast. Multiplanar CT image reconstructions of the cervical spine were also generated. COMPARISON:  Head CT 05/22/2018. Brain MRI, head and neck MRA 12/29/2017. FINDINGS: CT HEAD FINDINGS Brain: No midline shift, ventriculomegaly, mass effect, evidence of mass lesion, intracranial hemorrhage or evidence of cortically based acute infarction. Gray-white matter differentiation is within normal limits throughout the brain. Vascular: Calcified atherosclerosis at the skull base. Skull: Stable and intact. Sinuses/Orbits: Visualized paranasal sinuses and mastoids are stable and well pneumatized. Other: No scalp soft tissue injury identified. Negative orbits. CT CERVICAL SPINE FINDINGS Alignment: Relatively preserved cervical lordosis. Cervicothoracic junction alignment is within normal limits. Bilateral posterior element alignment is within normal limits. Skull base and vertebrae: Visualized skull base is intact. No atlanto-occipital dissociation. No cervical spine fracture identified. Small C7 cervical ribs (normal variant). Soft tissues and spinal canal: No prevertebral fluid or swelling. No visible canal hematoma. Negative noncontrast neck soft tissues aside from calcified carotid atherosclerosis. Disc levels: Mild to  moderate upper cervical facet hypertrophy primarily on the left. Lower cervical disc and endplate degeneration, mild if any lower cervical spinal stenosis suspected. Upper chest: Visible upper thoracic levels appear intact. Negative lung apices. IMPRESSION: 1. No acute traumatic injury identified in the head or cervical spine. 2. Stable and normal for age non contrast CT appearance of the brain. Electronically Signed   By: Genevie Ann M.D.   On: 08/18/2018 18:34   Mr Hip Left Wo Contrast  Result Date: 08/19/2018 CLINICAL DATA:  Left hip pain following 3 recent falls. No hip fracture visible on plain radiographs or CT earlier today. EXAM: MR OF THE LEFT HIP WITHOUT CONTRAST  TECHNIQUE: Multiplanar, multisequence MR imaging was performed. No intravenous contrast was administered. COMPARISON:  Pelvis and left hip radiographs obtained earlier today. Abdomen and pelvis CT obtained earlier today. FINDINGS: Normal appearing left hip with no fracture, dislocation or effusion. No surrounding or adjacent soft tissue edema. The right hip also has a normal appearance as do the pelvic bones. Mild lower lumbar spine dextroconvex scoliosis. Distal portion of a right ureteral stent in the urinary bladder. Unremarkable bowel loops. Previously noted mass-like densities in the retroperitoneal space on the right, inferior to the right kidney and adjacent to the right psoas muscle. IMPRESSION: 1. Normal appearing left hip without fracture or dislocation. 2. Previously noted retroperitoneal mass-like densities inferior to the right kidney and adjacent to the right psoas muscle. These have MR signal characteristics similar to fluid in adjacent fluid-filled small bowel loops, suggesting that these may represent seromas. Soft tissue masses cannot be excluded. As previously discussed, these can be re-evaluated with a follow-up CT with contrast. Electronically Signed   By: Claudie Revering M.D.   On: 08/19/2018 00:22   Ct Renal Stone Study  Result Date: 08/18/2018 CLINICAL DATA:  Left upper quadrant pain EXAM: CT ABDOMEN AND PELVIS WITHOUT CONTRAST TECHNIQUE: Multidetector CT imaging of the abdomen and pelvis was performed following the standard protocol without IV contrast. COMPARISON:  CT 08/06/2016 FINDINGS: Lower chest: Lung bases demonstrate no acute consolidation or pleural effusion. Heart size within normal limits. Trace pericardial effusion. Hepatobiliary: No focal liver abnormality is seen. Status post cholecystectomy. No biliary dilatation. Nodular liver contour consistent with cirrhosis. Pancreas: Unremarkable. No pancreatic ductal dilatation or surrounding inflammatory changes. Spleen: Borderline to  slightly enlarged. Adrenals/Urinary Tract: Adrenal glands are within normal limits. Right ureteral stent with proximal aspect in the right renal pelvis. Distal portion of the stent within the left aspect of the bladder. No stones identified along the course of the ureter. Moderate hydronephrosis is present. Slightly dense material is present within the right renal collecting system with possible fluid level. The right ureter is also dilated with urothelial thickening. Moderate perinephric edema and fat stranding on the right. Irregular hyperdensities within the inferior right perinephric space, some of which are somewhat masslike in appearance, measuring up to 3 cm in size. Punctate stones in the mid and lower pole of the left kidney. Stomach/Bowel: The stomach is nonenlarged. No dilated small bowel. There may be mild right colon wall thickening. The appendix is negative. Vascular/Lymphatic: Mild to moderate aortic atherosclerosis. No aneurysmal dilatation. Retroperitoneal lymph nodes measuring up to 11 mm in size. Reproductive: Status post hysterectomy. No adnexal masses. Other: No free air.  No significant free fluid. Musculoskeletal: Asymmetric enlargement of the right psoas muscle which appears slightly dense. This is contiguous with irregular density inferior to the right kidney. Mild scoliosis of the spine. Degenerative changes at L5-S1.  IMPRESSION: 1. A right ureteral stent is in place. There is moderate right hydronephrosis and hydroureter, raising possibility of stent malfunction. Moderate to marked right perinephric edema and slightly hyperdense thickening or fluid. Irregular densities are present within the right perinephric space, inferior to the right kidney, these appears somewhat masslike. Density is contiguous with the right psoas muscle which appears asymmetrically enlarged compared to the left. Findings could be secondary to organizing retroperitoneal hematoma, infection, or potentially soft tissue  mass given the configuration. Follow-up contrast enhanced study when clinically feasible might be helpful to clarify the findings. 2. Mild wall thickening of the ascending colon is questionable for a mild colitis. 3. Cirrhotic morphology of the liver. Spleen borderline to slightly enlarged. 4. Mild retroperitoneal adenopathy 5. Punctate nonobstructing stones in the left kidney. Electronically Signed   By: Donavan Foil M.D.   On: 08/18/2018 23:25   Dg Hip Unilat W Or Wo Pelvis 2-3 Views Left  Result Date: 08/18/2018 CLINICAL DATA:  LEFT hip pain after fall. EXAM: DG HIP (WITH OR WITHOUT PELVIS) 2-3V LEFT COMPARISON:  None. FINDINGS: There is no evidence of hip fracture or dislocation. There is no evidence of arthropathy or other focal bone abnormality. Osteopenia. RIGHT nephroureteral stent with proximal retaining loop projecting central in pelvis. IMPRESSION: Negative. If there is high clinical suspicion for occult hip fracture or the patient refuses to bear weight, consider further evaluation with MRI. Although CT is expeditious, evidence is lacking regarding accuracy of CT over plain film radiography. Electronically Signed   By: Elon Alas M.D.   On: 08/18/2018 19:56    Medications:   . atorvastatin  40 mg Oral Daily  . insulin aspart  0-9 Units Subcutaneous TID WC  . insulin detemir  15 Units Subcutaneous BID  . metoCLOPramide (REGLAN) injection  5 mg Intravenous Q6H  . metoprolol tartrate  12.5 mg Oral BID  . nystatin  5 mL Oral QID   Continuous Infusions: . 0.9 % NaCl with KCl 20 mEq / L 75 mL/hr at 08/19/18 1115     LOS: 0 days   Geradine Girt  [facility]   *Please refer to [email or web address], password [email or web address] to get updated schedule on who will round on this patient, as hospitalists switch teams weekly. If 7PM-7AM, please contact night-coverage at [email or web address], password [email or web address] for any overnight needs.  08/19/2018, 1:09 PM

## 2018-08-19 NOTE — Consult Note (Signed)
H&P Physician requesting consult: Eulogio Bear, DO  Chief Complaint: Right hydronephrosis, retained right ureteral stent  History of Present Illness: 62 year old female status post right ureteroscopy with laser lithotripsy with ureteral stent placement by Dr. Alexis Frock on 07/14/2016.  The patient states she is unaware that she had a follow-up.  She was lost to follow-up with the stent in place.  She was admitted today primarily for medication noncompliance.  She has a history of diabetes, atrial fibrillation, COPD, lung others.  Over the past several days, she has had several falls and syncopal episodes.  Due to a history of ureteral stent, she had a CT scan performed of the abdomen and pelvis.  Is revealed right ureteral stent in a normal position.  There were no stones identified along the course of the ureter however there was moderate hydronephrosis present there was some dense material within the right renal collecting system.  She also had some mild hydroureter.  There was some perinephric edema and fat stranding on the right.  There is mention of a density contiguous with the right psoas that may be secondary to organizing retroperitoneal hematoma, infection, or soft tissue mass.  The patient herself denies significant right flank pain.  Urinalysis was positive for moderate leukocyte negative nitrite, many bacteria, greater than 50 RBCs, greater than 50 WBCs.  She does not have significant voiding symptoms.  Past Medical History:  Diagnosis Date  . Acute encephalopathy 05/22/2018  . Allergy   . Anxiety   . Ascites   . C. difficile colitis 07/10/2015  . Cancer (HCC)    HX OF CANCER OF UTERUS   . Cirrhosis of liver not due to alcohol ([place]) 2016  . Degenerative disk disease   . Diverticulitis   . Gastroparesis   . GERD (gastroesophageal reflux disease)   . History of hiatal hernia   . Hypertension   . Hypothyroid   . Hypothyroidism due to amiodarone   . Ileus ([place]) 08/01/2015  .  Intussusception intestine ([place]) 05/2015  . Orthostatic hypotension   . PAF (paroxysmal atrial fibrillation) ([place]) 03/2015   a. new onset 03/2015 in setting of intractable N/V; b. on Eliquis 5 mg bid; c. CHADSVASc 4 (DM, TIA x 2, female)  . Pancreatitis   . Pneumonia 11/14/2015  . Right ureteral stone 07/14/2016  . Sick sinus syndrome ([place])   . Stomach ulcer   . Stroke [facility])    with minimal left sided weakness  . Syncope 01/2015  . Syncope due to orthostatic hypotension 05/18/2015  . Tachyarrhythmia 01/10/2016  . TIA (transient ischemic attack) 02/2015  . Type 1 diabetes ([place])    on levemir  . UTERINE CANCER, HX OF 03/27/2007   Qualifier: Diagnosis of  By: Maxie Better FNP, Rosalita Levan   . UTI (urinary tract infection) 05/22/2018   Past Surgical History:  Procedure Laterality Date  . ABDOMINAL HYSTERECTOMY    . CARDIAC CATHETERIZATION N/A 01/12/2016   Procedure: Left Heart Cath and Coronary Angiography;  Surgeon: Wellington Hampshire, MD;  Location: [facility];  Service: Cardiovascular;  Laterality: N/A;  . CHOLECYSTECTOMY    . CYSTOSCOPY/URETEROSCOPY/HOLMIUM LASER Right 07/14/2016   Procedure: CYSTOSCOPY/URETEROSCOPY/HOLMIUM LASER;  Surgeon: Alexis Frock, MD;  Location: [facility] ORS;  Service: Urology;  Laterality: Right;  . ESOPHAGOGASTRODUODENOSCOPY N/A 04/04/2015   Procedure: ESOPHAGOGASTRODUODENOSCOPY (EGD);  Surgeon: Hulen Luster, MD;  Location: [facility] ENDOSCOPY;  Service: Endoscopy;  Laterality: N/A;  . ESOPHAGOGASTRODUODENOSCOPY N/A 12/28/2017   Procedure: ESOPHAGOGASTRODUODENOSCOPY (EGD);  Surgeon: Sherri Sear  Reece Levy, MD;  Location: [place];  Service: Gastroenterology;  Laterality: N/A;  . ESOPHAGOGASTRODUODENOSCOPY (EGD) WITH PROPOFOL N/A 01/18/2016   Procedure: ESOPHAGOGASTRODUODENOSCOPY (EGD) WITH PROPOFOL;  Surgeon: Lucilla Lame, MD;  Location: [facility] ENDOSCOPY;  Service: Endoscopy;  Laterality: N/A;  . FLEXIBLE SIGMOIDOSCOPY N/A 01/18/2016   Procedure: FLEXIBLE SIGMOIDOSCOPY;   Surgeon: Lucilla Lame, MD;  Location: [facility] ENDOSCOPY;  Service: Endoscopy;  Laterality: N/A;  . HERNIA REPAIR      Home Medications:  Medications Prior to Admission  Medication Sig Dispense Refill Last Dose  . albuterol (PROVENTIL HFA;VENTOLIN HFA) 108 (90 Base) MCG/ACT inhaler Inhale 2 puffs into the lungs every 6 (six) hours as needed for wheezing or shortness of breath. 1 Inhaler 0 unk at prn  . apixaban (ELIQUIS) 5 MG TABS tablet Take 1 tablet (5 mg total) by mouth 2 (two) times daily. 180 tablet 0 08/18/2018 at 01100  . atorvastatin (LIPITOR) 40 MG tablet Take 1 tablet (40 mg total) by mouth daily. For cholesterol. 90 tablet 0 08/18/2018 at Unknown time  . cyclobenzaprine (FLEXERIL) 10 MG tablet Take 10 mg by mouth 3 (three) times daily as needed for muscle spasms.   08/17/2018 at Unknown time  . Insulin Detemir (LEVEMIR FLEXPEN) 100 UNIT/ML Pen Inject 15 Units into the skin 2 (two) times daily. 15 mL 2 08/18/2018 at Unknown time  . metoprolol tartrate (LOPRESSOR) 25 MG tablet Take 1 tablet (25 mg total) by mouth 2 (two) times daily. For blood pressure. 180 tablet 0 08/18/2018 at 01100  . blood glucose meter kit and supplies KIT Dispense based on patient and insurance preference. Use up to four times daily as directed. (FOR ICD-9 250.00, 250.01). 1 each 0   . budesonide-formoterol (SYMBICORT) 160-4.5 MCG/ACT inhaler Inhale 2 puffs into the lungs 2 (two) times daily. (Patient not taking: Reported on 08/14/2018) 1 Inhaler 0 Not Taking at Unknown time  . lisinopril (PRINIVIL,ZESTRIL) 5 MG tablet Take 1 tablet by mouth every morning for kidney protection against diabetes. (Patient not taking: Reported on 08/14/2018) 30 tablet 0 Not Taking at Unknown time  . oxyCODONE ER (XTAMPZA ER) 9 MG C12A Take 9 mg by mouth every 8 (eight) hours.   60 days ago  . potassium chloride 20 MEQ TBCR Take 20 mEq by mouth daily. (Patient not taking: Reported on 08/14/2018) 30 tablet 1 Not Taking at Unknown time   Allergies:   Allergies  Allergen Reactions  . Hydrocodone Other (See Comments)    Pt states that this medication caused cirrhosis of the liver.    . Aspirin   . Erythromycin Other (See Comments)    Reaction:  Fever   . Prednisone Other (See Comments)    Reaction:  Unknown   . Rosiglitazone Maleate Swelling  . Codeine Sulfate Rash  . Tetanus-Diphtheria Toxoids Td Rash and Other (See Comments)    Reaction:  Fever     Family History  Problem Relation Age of Onset  . Hypertension Mother   . CAD Sister   . Heart attack Sister        Deceased 11-19-2014  . CAD Brother    Social History:  reports that she has quit smoking. Her smoking use included cigarettes. She has never used smokeless tobacco. She reports that she does not drink alcohol or use drugs.  ROS: A complete review of systems was performed.  All systems are negative except for pertinent findings as noted. ROS   Physical Exam:  Vital signs in last 24 hours: Temp:  [  97.9 F (36.6 C)-98.2 F (36.8 C)] 97.9 F (36.6 C) (12/10 1222) Pulse Rate:  [92-111] 111 (12/10 1222) Resp:  [16-20] 20 (12/10 1222) BP: (104-129)/(61-73) 118/73 (12/10 1222) SpO2:  [99 %-100 %] 99 % (12/10 1222) Weight:  [55.4 kg] 55.4 kg (12/10 0902) General:  Alert and oriented, No acute distress HEENT: Normocephalic, atraumatic Neck: No JVD or lymphadenopathy Cardiovascular: Regular rate and rhythm Lungs: Regular rate and effort Abdomen: Soft, nontender, nondistended, no abdominal masses Back: No CVA tenderness Extremities: No edema Neurologic: Grossly intact  Laboratory Data:  Results for orders placed or performed during the hospital encounter of 08/18/18 (from the past 24 hour(s))  CBG monitoring, ED     Status: Abnormal   Collection Time: 08/19/18 12:38 AM  Result Value Ref Range   Glucose-Capillary 102 (H) 70 - 99 mg/dL  POC CBG, ED     Status: Abnormal   Collection Time: 08/19/18  5:14 AM  Result Value Ref Range   Glucose-Capillary 108 (H) 70 -  99 mg/dL  CBG monitoring, ED     Status: None   Collection Time: 08/19/18  8:07 AM  Result Value Ref Range   Glucose-Capillary 77 70 - 99 mg/dL  Basic metabolic panel     Status: Abnormal   Collection Time: 08/19/18  8:54 AM  Result Value Ref Range   Sodium 136 135 - 145 mmol/L   Potassium 2.6 (LL) 3.5 - 5.1 mmol/L   Chloride 102 98 - 111 mmol/L   CO2 22 22 - 32 mmol/L   Glucose, Bld 89 70 - 99 mg/dL   BUN 18 8 - 23 mg/dL   Creatinine, Ser 1.13 (H) 0.44 - 1.00 mg/dL   Calcium 7.8 (L) 8.9 - 10.3 mg/dL   GFR calc non Af Amer 51 (L) >60 mL/min   GFR calc Af Amer 59 (L) >60 mL/min   Anion gap 12 5 - 15  CBC     Status: Abnormal   Collection Time: 08/19/18  8:54 AM  Result Value Ref Range   WBC 9.1 4.0 - 10.5 K/uL   RBC 4.13 3.87 - 5.11 MIL/uL   Hemoglobin 11.0 (L) 12.0 - 15.0 g/dL   HCT 35.4 (L) 36.0 - 46.0 %   MCV 85.7 80.0 - 100.0 fL   MCH 26.6 26.0 - 34.0 pg   MCHC 31.1 30.0 - 36.0 g/dL   RDW 15.1 11.5 - 15.5 %   Platelets 158 150 - 400 K/uL   nRBC 0.0 0.0 - 0.2 %  Cortisol     Status: None   Collection Time: 08/19/18  8:54 AM  Result Value Ref Range   Cortisol, Plasma 16.7 ug/dL  Magnesium     Status: Abnormal   Collection Time: 08/19/18  8:54 AM  Result Value Ref Range   Magnesium 1.5 (L) 1.7 - 2.4 mg/dL  Glucose, capillary     Status: None   Collection Time: 08/19/18 12:07 PM  Result Value Ref Range   Glucose-Capillary 91 70 - 99 mg/dL  Urinalysis, Routine w reflex microscopic     Status: Abnormal   Collection Time: 08/19/18  1:21 PM  Result Value Ref Range   Color, Urine ORANGE (A) YELLOW   APPearance TURBID (A) CLEAR   Specific Gravity, Urine >1.030 (H) 1.005 - 1.030   pH 5.0 5.0 - 8.0   Glucose, UA NEGATIVE NEGATIVE mg/dL   Hgb urine dipstick LARGE (A) NEGATIVE   Bilirubin Urine MODERATE (A) NEGATIVE   Ketones, ur 15 (A)  NEGATIVE mg/dL   Protein, ur 100 (A) NEGATIVE mg/dL   Nitrite NEGATIVE NEGATIVE   Leukocytes, UA MODERATE (A) NEGATIVE  Urinalysis,  Microscopic (reflex)     Status: Abnormal   Collection Time: 08/19/18  1:21 PM  Result Value Ref Range   RBC / HPF >50 0 - 5 RBC/hpf   WBC, UA >50 0 - 5 WBC/hpf   Bacteria, UA MANY (A) NONE SEEN   Squamous Epithelial / LPF 21-50 0 - 5   Mucus PRESENT    WBC Casts, UA PRESENT   Glucose, capillary     Status: None   Collection Time: 08/19/18  4:21 PM  Result Value Ref Range   Glucose-Capillary 83 70 - 99 mg/dL   No results found for this or any previous visit (from the past 240 hour(s)). Creatinine: Recent Labs    08/14/18 1036 08/18/18 1546 08/18/18 1605 08/19/18 0854  CREATININE 1.31* 1.37* 1.20* 1.13*    Impression/Assessment:  Right retained ureteral stent Mild right hydronephrosis possibly secondary to the above Potential enhancing soft tissue mass in the right kidney  Plan:  Recommend treatment for acute issues.  Urine culture has been ordered.  Would not start antibiotics or antifungals unless culture is positive.  Would recommend having a urine culture back prior to removing the stent.  She can follow-up outpatient for ureteral stent removal.  Given the amount of time that it has been in, it is possible that the stent will unable to be removed in clinic and require intervention in the operating room.  Marton Redwood, III 08/19/2018, 8:27 PM

## 2018-08-20 DIAGNOSIS — I951 Orthostatic hypotension: Secondary | ICD-10-CM | POA: Diagnosis not present

## 2018-08-20 DIAGNOSIS — I495 Sick sinus syndrome: Secondary | ICD-10-CM

## 2018-08-20 DIAGNOSIS — N179 Acute kidney failure, unspecified: Secondary | ICD-10-CM | POA: Diagnosis not present

## 2018-08-20 DIAGNOSIS — G894 Chronic pain syndrome: Secondary | ICD-10-CM | POA: Diagnosis not present

## 2018-08-20 DIAGNOSIS — K7469 Other cirrhosis of liver: Secondary | ICD-10-CM

## 2018-08-20 DIAGNOSIS — I480 Paroxysmal atrial fibrillation: Secondary | ICD-10-CM | POA: Diagnosis not present

## 2018-08-20 DIAGNOSIS — D649 Anemia, unspecified: Secondary | ICD-10-CM | POA: Diagnosis not present

## 2018-08-20 DIAGNOSIS — E1165 Type 2 diabetes mellitus with hyperglycemia: Secondary | ICD-10-CM | POA: Diagnosis not present

## 2018-08-20 DIAGNOSIS — I10 Essential (primary) hypertension: Secondary | ICD-10-CM | POA: Diagnosis not present

## 2018-08-20 LAB — COMPREHENSIVE METABOLIC PANEL
ALT: 31 U/L (ref 0–44)
AST: 56 U/L — ABNORMAL HIGH (ref 15–41)
Albumin: 1.9 g/dL — ABNORMAL LOW (ref 3.5–5.0)
Alkaline Phosphatase: 113 U/L (ref 38–126)
Anion gap: 6 (ref 5–15)
BUN: 14 mg/dL (ref 8–23)
CO2: 24 mmol/L (ref 22–32)
Calcium: 7.6 mg/dL — ABNORMAL LOW (ref 8.9–10.3)
Chloride: 106 mmol/L (ref 98–111)
Creatinine, Ser: 1.05 mg/dL — ABNORMAL HIGH (ref 0.44–1.00)
GFR calc Af Amer: >60 mL/min (ref >60)
GFR calc non Af Amer: 56 mL/min — ABNORMAL LOW (ref >60)
Glucose, Bld: 81 mg/dL (ref 70–99)
POTASSIUM: 2.9 mmol/L — AB (ref 3.5–5.1)
Sodium: 136 mmol/L (ref 135–145)
Total Bilirubin: 0.8 mg/dL (ref 0.3–1.2)
Total Protein: 6 g/dL — ABNORMAL LOW (ref 6.5–8.1)

## 2018-08-20 LAB — GLUCOSE, CAPILLARY
Glucose-Capillary: 76 mg/dL (ref 70–99)
Glucose-Capillary: 115 mg/dL — ABNORMAL HIGH (ref 70–99)

## 2018-08-20 LAB — CBC
HCT: 33.1 % — ABNORMAL LOW (ref 36.0–46.0)
Hemoglobin: 10.2 g/dL — ABNORMAL LOW (ref 12.0–15.0)
MCH: 26.6 pg (ref 26.0–34.0)
MCHC: 30.8 g/dL (ref 30.0–36.0)
MCV: 86.2 fL (ref 80.0–100.0)
Platelets: 159 10*3/uL (ref 150–400)
RBC: 3.84 MIL/uL — ABNORMAL LOW (ref 3.87–5.11)
RDW: 15.4 % (ref 11.5–15.5)
WBC: 8.5 10*3/uL (ref 4.0–10.5)
nRBC: 0 % (ref 0.0–0.2)

## 2018-08-20 LAB — MAGNESIUM: Magnesium: 2 mg/dL (ref 1.7–2.4)

## 2018-08-20 MED ORDER — POTASSIUM CHLORIDE CRYS ER 20 MEQ PO TBCR
40.0000 meq | EXTENDED_RELEASE_TABLET | Freq: Once | ORAL | Status: AC
  Administered 2018-08-20: 40 meq via ORAL
  Filled 2018-08-20: qty 2

## 2018-08-20 NOTE — Care Management Note (Signed)
Case Management Note  Patient Details  Name: [name] MRN: [id number] Date of Birth: [birth year]  Subjective/Objective:  62 yo female presented after losing consciousness, hitting her head during a fall at home.               Action/Plan: CM met with patient to discuss transitional needs. Patient lived at home with spouse, independent with ADLs, uses a cane to assist with ambulation. PCP verified as: Dr. Alma Friendly; pharmacy: [facility]. PT recommended HHPT, with patient agreeable. CMS HH compare list provided with [facility] selected, stating having used in the past. [place] referral given to [facility] liaison; AVS updated. Patient indicated her friend would provide transportation home. No further needs from CM.   Expected Discharge Date:  08/20/18               Expected Discharge Plan:  [place]  In-House Referral:  NA  Discharge planning Services  CM Consult  Post Acute Care Choice:  Home Health Choice offered to:  Patient  DME Arranged:  N/A DME Agency:  NA  HH Arranged:  PT [place] Agency:  [place]  Status of Service:  Completed, signed off  If discussed at [place] of Stay Meetings, dates discussed:    Additional Comments:  Midge Minium RN, BSN, NCM-BC, ACM-RN [phone] 08/20/2018, 3:17 PM

## 2018-08-20 NOTE — Evaluation (Addendum)
Physical Therapy Evaluation Patient Details Name: [name] MRN: [id number] DOB: [birth year] Today's Date: 08/20/2018   History of Present Illness  [name] is a 62 y.o. female with history of diabetes mellitus type 2, paroxysmal atrial fibrillation, COPD, chronic pain, hypothyroidism, hyperlipidemia who has not been compliant with her medication and was recently admitted in September 3 months ago for DKA had followed up with her primary care physician recently last week 4 days ago and was restarted on her medications.  Patient states over the last 3 days leading to this episode of care, she had at least 6 falls in the last 1 was the morning of 12/9 around 3 AM when patient states she was walking in her house when she suddenly lost consciousness and hit her head.  Due to frequent falls patient was brought to the ER.  Clinical Impression   Pt admitted with above diagnosis. Pt currently with functional limitations due to the deficits listed below (see PT Problem List). Managing with RW or cane prn prior to admission; Was typically walking in her neighborhood to the mailboxes; Presents with decr activity tolerance, higher fall risk; Orthostatics taken -- noted BP drop from lying>sitting>standing, but regroups at standing 3 minutes; Educated [name] in pausing for a few minutes when she sits or stands, and to self-monitor for activity tolerance when walking, and to sit whenever she feels nauseted or lightheaded;  Consider TED hose; Pt will benefit from skilled PT to increase their independence and safety with mobility to allow discharge to the venue listed below.      08/20/18 1054  Orthostatic Lying   BP- Lying 94/84  Pulse- Lying 88  Orthostatic Sitting  BP- Sitting (!) 89/70  Pulse- Sitting 92  Orthostatic Standing at 0 minutes  BP- Standing at 0 minutes (!) 72/58  Pulse- Standing at 0 minutes 61  Orthostatic Standing at 3 minutes  BP- Standing at 3 minutes 100/86  Pulse-  Standing at 3 minutes 153      08/20/18 1104 08/20/18 1111 08/20/18 1118  Vital Signs  Pulse Rate (!) 129 (!) 101 77  BP (!) 108/94 103/75 90/75  Patient Position (if appropriate) Standing Sitting (symptomatic for feeling "sick" post walking 68f) Standing (after walking approx 538f no feeling 'sick')     Follow Up [facility]ations [facility]     Equipment Recommendations  None recommended by PT    Recommendations for Other Services       Precautions / Restrictions Precautions Precautions: Fall Precaution Comments: Reports fall is not preceded by symptoms of lightheadedness      Mobility  Bed Mobility Overal bed mobility: Independent                Transfers Overall transfer level: Needs assistance Equipment used: None Transfers: Sit to/from Stand Sit to Stand: Supervision         General transfer comment: Supervision and cues to self-monitor for activity tolerance  Ambulation/Gait Ambulation/Gait assistance: Min guard;+2 safety/equipment Gait Distance (Feet): 50 Feet(x2; one seated rest break due to pt feeling "sick") Assistive device: None;Rolling walker (2 wheeled) Gait Pattern/deviations: Step-through pattern Gait velocity: slowed   General Gait Details: First bout of walking without RW, and noted [name] to reach out for UE support; stopped walking at pt's report, "I have to sit down" due to feeling "sick", later she told me she was nauseated; Walked back with RW with smoother steps, better activity tolerance, and we were able  to stand at EOB for one last BP  Stairs            Wheelchair Mobility    Modified Rankin (Stroke Patients Only)       Balance Overall balance assessment: Mild deficits observed, not formally tested                                           Pertinent Vitals/Pain Pain Assessment: No/denies pain    Home Living Family/patient expects to be discharged to:: Private  residence Living Arrangements: Spouse/significant other Available Help at Discharge: Family;Available 24 hours/day Type of Home: Mobile home Home Access: Level entry     Home Layout: One level Home Equipment: Walker - 2 wheels;Shower seat;Cane - single point Additional Comments: Pt reports husband available for 24/7 support. Son can also help    Prior Function Level of Independence: Independent with assistive device(s)         Comments: Pt reports mod indep with RW; indep with ADLs. Pt reports she bathes at the sink. Still driving, grocery shopping about 2x/month     Hand Dominance   Dominant Hand: Right    Extremity/Trunk Assessment   Upper Extremity Assessment Upper Extremity Assessment: Overall WFL for tasks assessed    Lower Extremity Assessment Lower Extremity Assessment: Overall WFL for tasks assessed       Communication   Communication: No difficulties  Cognition Arousal/Alertness: Awake/alert Behavior During Therapy: WFL for tasks assessed/performed Overall Cognitive Status: Within Functional Limits for tasks assessed                                        General Comments General comments (skin integrity, edema, etc.): See also doc flowsheets    Exercises     Assessment/Plan    PT Assessment Patient needs continued PT services  PT Problem List Decreased activity tolerance;Decreased mobility;Decreased knowledge of use of DME;Decreased knowledge of precautions;Cardiopulmonary status limiting activity       PT Treatment Interventions DME instruction;Gait training;Stair training;Functional mobility training;Therapeutic activities;Therapeutic exercise;Balance training;Patient/family education    PT Goals (Current goals can be found in the Care Plan section)  Acute Rehab PT Goals Patient Stated Goal: Hopes to go home soon PT Goal Formulation: With patient Time For Goal Achievement: 08/27/18 Potential to Achieve Goals: Good     Frequency Min 3X/week   Barriers to discharge        Co-evaluation               AM-PAC PT "6 Clicks" Mobility  Outcome Measure Help needed turning from your back to your side while in a flat bed without using bedrails?: None Help needed moving from lying on your back to sitting on the side of a flat bed without using bedrails?: None Help needed moving to and from a bed to a chair (including a wheelchair)?: None Help needed standing up from a chair using your arms (e.g., wheelchair or bedside chair)?: None Help needed to walk in hospital room?: None Help needed climbing 3-5 steps with a railing? : A Little 6 Click Score: 23    End of Session Equipment Utilized During Treatment: Gait belt(vitals machine) Activity Tolerance: Other (comment)(Limited by feeling nausea with amb/upright activity; soft BP) Patient left: in bed;with call bell/phone within reach Nurse Communication: Mobility  status;Other (comment)(serial BP values) PT Visit Diagnosis: Unsteadiness on feet (R26.81);Other abnormalities of gait and mobility (R26.89);Repeated falls (R29.6)    Time: 6815-9470 PT Time Calculation (min) (ACUTE ONLY): 29 min   Charges:   PT Evaluation $PT Eval Moderate Complexity: 1 Mod PT Treatments $Gait Training: 8-22 mins        Roney Marion, PT  Acute Rehabilitation Services Pager [phone] Office [phone]   [place] 08/20/2018, 11:59 AM

## 2018-08-20 NOTE — Progress Notes (Signed)
Pt endorses severe right flank pain that radiates around to her suprapubic area.  This pain wasn't there yestereday.  Today 8/10.

## 2018-08-20 NOTE — Discharge Summary (Addendum)
Physician Discharge Summary  [name]:[id number] DOB: May 10, 1956 DOA: 08/18/2018  PCP: [name], NP  Admit date: 08/18/2018  Discharge date: 08/20/2018  Admitted From: Home  Disposition:  Home with home health services  Discharge Condition: Stable  CODE STATUS:  Full  Diet recommendation: Heart Healthy  Brief/Interim Summary:  Patient is a 62 year old female with past medical history of diabetes mellitus type 2, paroxysmal atrial fibrillation, COPD, chronic pain, hypothyroidism, hyperlipidemia and noncompliance with medication presented to the hospital with loss of consciousness and recent falls.  Patient was noted to be orthostatic and received IV fluids with improvement in her orthostatic symptoms.  She also complained of weight loss and prerenal pain.  Patient does have history of gastroparesis.  She had a CT scan of the abdomen which showed right hydronephrosis and hydroureter raising possibility of stent malfunction function.  MRI of the left hip without contrast was also performed due to hip pain which showed normal appearing left hip.  There was previously noted retroperitoneal masslike density inferior to the right kidney and adjacent to the right psoas muscle.  Urology was consulted and patient was advised urine culture and no antibiotics until the cultures were done.  The plan is to remove ureteric stent after the urine cultures were discerned.  She did not did have significant hypokalemia which was replenished IV and orally.  She does continue to take potassium supplement at home which she was encouraged to.    Patient insisted on getting discharged home because she stated that she ambulated well and felt much better today.  She insisted to leave no matter what I explained the need for continuing potassium supplements and need for follow-up on urine culture.  Patient stated that she had to go home no matter what.  She does have a home health set up.  Patient was  advised to follow-up with her primary care physician after discharge and to follow-up with her urologist as outpatient   Discharge Diagnoses:  Principal Problem:   Orthostatic hypotension Active Problems:   Chronic pain syndrome   Essential hypertension   Prolonged QT interval   Uncontrolled type 2 diabetes mellitus (HCC)   Cryptogenic cirrhosis (HCC)   Paroxysmal atrial fibrillation (HCC)   Tachy-brady syndrome (HCC)   Normochromic normocytic anemia   ARF (acute renal failure) ([place])   Discharge Instructions  Discharge Instructions    Diet - low sodium heart healthy   Complete by:  As directed    Discharge instructions   Complete by:  As directed    Follow up with your primary care physician in one week. Continue to take your potassium as usual.   Increase activity slowly   Complete by:  As directed      Allergies as of 08/20/2018      Reactions   Hydrocodone Other (See Comments)   Pt states that this medication caused cirrhosis of the liver.     Aspirin    Erythromycin Other (See Comments)   Reaction:  Fever    Prednisone Other (See Comments)   Reaction:  Unknown    Rosiglitazone Maleate Swelling   Codeine Sulfate Rash   Tetanus-diphtheria Toxoids Td Rash, Other (See Comments)   Reaction:  Fever       Medication List    TAKE these medications   albuterol 108 (90 Base) MCG/ACT inhaler Commonly known as:  PROVENTIL HFA;VENTOLIN HFA Inhale 2 puffs into the lungs every 6 (six) hours as needed for wheezing or shortness of  breath.   apixaban 5 MG Tabs tablet Commonly known as:  ELIQUIS Take 1 tablet (5 mg total) by mouth 2 (two) times daily.   atorvastatin 40 MG tablet Commonly known as:  LIPITOR Take 1 tablet (40 mg total) by mouth daily. For cholesterol.   blood glucose meter kit and supplies Kit Dispense based on patient and insurance preference. Use up to four times daily as directed. (FOR ICD-9 250.00, 250.01).   budesonide-formoterol 160-4.5 MCG/ACT  inhaler Commonly known as:  SYMBICORT Inhale 2 puffs into the lungs 2 (two) times daily.   cyclobenzaprine 10 MG tablet Commonly known as:  FLEXERIL Take 10 mg by mouth 3 (three) times daily as needed for muscle spasms.   Insulin Detemir 100 UNIT/ML Pen Commonly known as:  LEVEMIR Inject 15 Units into the skin 2 (two) times daily.   lisinopril 5 MG tablet Commonly known as:  PRINIVIL,ZESTRIL Take 1 tablet by mouth every morning for kidney protection against diabetes.   metoprolol tartrate 25 MG tablet Commonly known as:  LOPRESSOR Take 1 tablet (25 mg total) by mouth 2 (two) times daily. For blood pressure.   Potassium Chloride ER 20 MEQ Tbcr Take 20 mEq by mouth daily.   XTAMPZA ER 9 MG C12a Generic drug:  oxyCODONE ER Take 9 mg by mouth every 8 (eight) hours.      Follow-up Information    [name], NP. Schedule an appointment as soon as possible for a visit in 1 week(s).   Specialty:  Internal Medicine Contact information: [place] [address] [place], [facility].   Specialty:  Home Health Services Why:  Home Health Physical Therapy Contact information: [place] [address] [phone]          Allergies  Allergen Reactions  . Hydrocodone Other (See Comments)    Pt states that this medication caused cirrhosis of the liver.    . Aspirin   . Erythromycin Other (See Comments)    Reaction:  Fever   . Prednisone Other (See Comments)    Reaction:  Unknown   . Rosiglitazone Maleate Swelling  . Codeine Sulfate Rash  . Tetanus-Diphtheria Toxoids Td Rash and Other (See Comments)    Reaction:  Fever     Consultations: Urology  Procedures/Studies: Dg Chest 2 View  Result Date: 08/18/2018 CLINICAL DATA:  Syncope EXAM: CHEST - 2 VIEW COMPARISON:  05/22/2018, 12/28/2017 FINDINGS: No acute opacity or pleural effusion. Cardiomediastinal silhouette within normal limits.  Aortic atherosclerosis. No pneumothorax. IMPRESSION: No active cardiopulmonary disease. Electronically Signed   By: Donavan Foil M.D.   On: 08/18/2018 17:34   Dg Thoracic Spine 2 View  Result Date: 08/18/2018 CLINICAL DATA:  Midthoracic spine pain. Multiple falls in the last 3 days. EXAM: THORACIC SPINE 2 VIEWS COMPARISON:  Chest x-ray dated 05/22/2018 FINDINGS: There is no evidence of thoracic spine fracture. Slight thoracolumbar scoliosis. No other significant bone abnormalities are identified. IMPRESSION: No significant abnormality of the thoracic spine. Electronically Signed   By: Lorriane Shire M.D.   On: 08/18/2018 17:37   Ct Head Wo Contrast  Result Date: 08/18/2018 CLINICAL DATA:  62 year old female status post fall backwards this morning striking head on floor. EXAM: CT HEAD WITHOUT CONTRAST CT CERVICAL SPINE WITHOUT CONTRAST TECHNIQUE: Multidetector CT imaging of the head and cervical spine was performed following the standard protocol without intravenous contrast. Multiplanar CT image reconstructions of the  cervical spine were also generated. COMPARISON:  Head CT 05/22/2018. Brain MRI, head and neck MRA 12/29/2017. FINDINGS: CT HEAD FINDINGS Brain: No midline shift, ventriculomegaly, mass effect, evidence of mass lesion, intracranial hemorrhage or evidence of cortically based acute infarction. Gray-white matter differentiation is within normal limits throughout the brain. Vascular: Calcified atherosclerosis at the skull base. Skull: Stable and intact. Sinuses/Orbits: Visualized paranasal sinuses and mastoids are stable and well pneumatized. Other: No scalp soft tissue injury identified. Negative orbits. CT CERVICAL SPINE FINDINGS Alignment: Relatively preserved cervical lordosis. Cervicothoracic junction alignment is within normal limits. Bilateral posterior element alignment is within normal limits. Skull base and vertebrae: Visualized skull base is intact. No atlanto-occipital dissociation. No  cervical spine fracture identified. Small C7 cervical ribs (normal variant). Soft tissues and spinal canal: No prevertebral fluid or swelling. No visible canal hematoma. Negative noncontrast neck soft tissues aside from calcified carotid atherosclerosis. Disc levels: Mild to moderate upper cervical facet hypertrophy primarily on the left. Lower cervical disc and endplate degeneration, mild if any lower cervical spinal stenosis suspected. Upper chest: Visible upper thoracic levels appear intact. Negative lung apices. IMPRESSION: 1. No acute traumatic injury identified in the head or cervical spine. 2. Stable and normal for age non contrast CT appearance of the brain. Electronically Signed   By: Genevie Ann M.D.   On: 08/18/2018 18:34   Ct Cervical Spine Wo Contrast  Result Date: 08/18/2018 CLINICAL DATA:  62 year old female status post fall backwards this morning striking head on floor. EXAM: CT HEAD WITHOUT CONTRAST CT CERVICAL SPINE WITHOUT CONTRAST TECHNIQUE: Multidetector CT imaging of the head and cervical spine was performed following the standard protocol without intravenous contrast. Multiplanar CT image reconstructions of the cervical spine were also generated. COMPARISON:  Head CT 05/22/2018. Brain MRI, head and neck MRA 12/29/2017. FINDINGS: CT HEAD FINDINGS Brain: No midline shift, ventriculomegaly, mass effect, evidence of mass lesion, intracranial hemorrhage or evidence of cortically based acute infarction. Gray-white matter differentiation is within normal limits throughout the brain. Vascular: Calcified atherosclerosis at the skull base. Skull: Stable and intact. Sinuses/Orbits: Visualized paranasal sinuses and mastoids are stable and well pneumatized. Other: No scalp soft tissue injury identified. Negative orbits. CT CERVICAL SPINE FINDINGS Alignment: Relatively preserved cervical lordosis. Cervicothoracic junction alignment is within normal limits. Bilateral posterior element alignment is within  normal limits. Skull base and vertebrae: Visualized skull base is intact. No atlanto-occipital dissociation. No cervical spine fracture identified. Small C7 cervical ribs (normal variant). Soft tissues and spinal canal: No prevertebral fluid or swelling. No visible canal hematoma. Negative noncontrast neck soft tissues aside from calcified carotid atherosclerosis. Disc levels: Mild to moderate upper cervical facet hypertrophy primarily on the left. Lower cervical disc and endplate degeneration, mild if any lower cervical spinal stenosis suspected. Upper chest: Visible upper thoracic levels appear intact. Negative lung apices. IMPRESSION: 1. No acute traumatic injury identified in the head or cervical spine. 2. Stable and normal for age non contrast CT appearance of the brain. Electronically Signed   By: Genevie Ann M.D.   On: 08/18/2018 18:34   Mr Hip Left Wo Contrast  Result Date: 08/19/2018 CLINICAL DATA:  Left hip pain following 3 recent falls. No hip fracture visible on plain radiographs or CT earlier today. EXAM: MR OF THE LEFT HIP WITHOUT CONTRAST TECHNIQUE: Multiplanar, multisequence MR imaging was performed. No intravenous contrast was administered. COMPARISON:  Pelvis and left hip radiographs obtained earlier today. Abdomen and pelvis CT obtained earlier today. FINDINGS: Normal appearing left hip with  no fracture, dislocation or effusion. No surrounding or adjacent soft tissue edema. The right hip also has a normal appearance as do the pelvic bones. Mild lower lumbar spine dextroconvex scoliosis. Distal portion of a right ureteral stent in the urinary bladder. Unremarkable bowel loops. Previously noted mass-like densities in the retroperitoneal space on the right, inferior to the right kidney and adjacent to the right psoas muscle. IMPRESSION: 1. Normal appearing left hip without fracture or dislocation. 2. Previously noted retroperitoneal mass-like densities inferior to the right kidney and adjacent to  the right psoas muscle. These have MR signal characteristics similar to fluid in adjacent fluid-filled small bowel loops, suggesting that these may represent seromas. Soft tissue masses cannot be excluded. As previously discussed, these can be re-evaluated with a follow-up CT with contrast. Electronically Signed   By: Claudie Revering M.D.   On: 08/19/2018 00:22   Ct Renal Stone Study  Result Date: 08/18/2018 CLINICAL DATA:  Left upper quadrant pain EXAM: CT ABDOMEN AND PELVIS WITHOUT CONTRAST TECHNIQUE: Multidetector CT imaging of the abdomen and pelvis was performed following the standard protocol without IV contrast. COMPARISON:  CT 08/06/2016 FINDINGS: Lower chest: Lung bases demonstrate no acute consolidation or pleural effusion. Heart size within normal limits. Trace pericardial effusion. Hepatobiliary: No focal liver abnormality is seen. Status post cholecystectomy. No biliary dilatation. Nodular liver contour consistent with cirrhosis. Pancreas: Unremarkable. No pancreatic ductal dilatation or surrounding inflammatory changes. Spleen: Borderline to slightly enlarged. Adrenals/Urinary Tract: Adrenal glands are within normal limits. Right ureteral stent with proximal aspect in the right renal pelvis. Distal portion of the stent within the left aspect of the bladder. No stones identified along the course of the ureter. Moderate hydronephrosis is present. Slightly dense material is present within the right renal collecting system with possible fluid level. The right ureter is also dilated with urothelial thickening. Moderate perinephric edema and fat stranding on the right. Irregular hyperdensities within the inferior right perinephric space, some of which are somewhat masslike in appearance, measuring up to 3 cm in size. Punctate stones in the mid and lower pole of the left kidney. Stomach/Bowel: The stomach is nonenlarged. No dilated small bowel. There may be mild right colon wall thickening. The appendix is  negative. Vascular/Lymphatic: Mild to moderate aortic atherosclerosis. No aneurysmal dilatation. Retroperitoneal lymph nodes measuring up to 11 mm in size. Reproductive: Status post hysterectomy. No adnexal masses. Other: No free air.  No significant free fluid. Musculoskeletal: Asymmetric enlargement of the right psoas muscle which appears slightly dense. This is contiguous with irregular density inferior to the right kidney. Mild scoliosis of the spine. Degenerative changes at L5-S1. IMPRESSION: 1. A right ureteral stent is in place. There is moderate right hydronephrosis and hydroureter, raising possibility of stent malfunction. Moderate to marked right perinephric edema and slightly hyperdense thickening or fluid. Irregular densities are present within the right perinephric space, inferior to the right kidney, these appears somewhat masslike. Density is contiguous with the right psoas muscle which appears asymmetrically enlarged compared to the left. Findings could be secondary to organizing retroperitoneal hematoma, infection, or potentially soft tissue mass given the configuration. Follow-up contrast enhanced study when clinically feasible might be helpful to clarify the findings. 2. Mild wall thickening of the ascending colon is questionable for a mild colitis. 3. Cirrhotic morphology of the liver. Spleen borderline to slightly enlarged. 4. Mild retroperitoneal adenopathy 5. Punctate nonobstructing stones in the left kidney. Electronically Signed   By: Donavan Foil M.D.   On: 08/18/2018 23:25  Dg Hip Unilat W Or Wo Pelvis 2-3 Views Left  Result Date: 08/18/2018 CLINICAL DATA:  LEFT hip pain after fall. EXAM: DG HIP (WITH OR WITHOUT PELVIS) 2-3V LEFT COMPARISON:  None. FINDINGS: There is no evidence of hip fracture or dislocation. There is no evidence of arthropathy or other focal bone abnormality. Osteopenia. RIGHT nephroureteral stent with proximal retaining loop projecting central in pelvis.  IMPRESSION: Negative. If there is high clinical suspicion for occult hip fracture or the patient refuses to bear weight, consider further evaluation with MRI. Although CT is expeditious, evidence is lacking regarding accuracy of CT over plain film radiography. Electronically Signed   By: Elon Alas M.D.   On: 08/18/2018 19:56     Subjective: Patient states that that she feels much better today no dizziness lightheadedness shortness of breath she stated that she ambulated well and needs to go home no matter what  Discharge Exam: Vitals:   08/20/18 1118 08/20/18 1206  BP: 90/75 93/63  Pulse: 77 85  Resp:  16  Temp:  98.7 F (37.1 C)  SpO2:  97%   Vitals:   08/20/18 1104 08/20/18 1111 08/20/18 1118 08/20/18 1206  BP: (!) 108/94 103/75 90/75 93/63   Pulse: (!) 129 (!) 101 77 85  Resp:    16  Temp:    98.7 F (37.1 C)  TempSrc:    Oral  SpO2:    97%  Weight:      Height:        General: Pt is alert, awake, not in acute distress Cardiovascular: RRR, S1/S2 +, no rubs, no gallops Respiratory: CTA bilaterally, no wheezing, no rhonchi Abdominal: Soft, NT, ND, bowel sounds + CNS: non focal. Extremities: no edema, no cyanosis  The results of significant diagnostics from this hospitalization (including imaging, microbiology, ancillary and laboratory) are listed below for reference.    Microbiology: No results found for this or any previous visit (from the past 240 hour(s)).   Labs: BNP (last 3 results) No results for input(s): BNP in the last 8760 hours. Basic Metabolic Panel: Recent Labs  Lab 08/14/18 1036 08/18/18 1546 08/18/18 1605 08/19/18 0854 08/20/18 0444  NA 127* 131* 132* 136 136  K 3.0* 2.8* 3.4* 2.6* 2.9*  CL 92* 95* 97* 102 106  CO2 22 24  --  22 24  GLUCOSE 459* 226* 225* 89 81  BUN 17 18 25* 18 14  CREATININE 1.31* 1.37* 1.20* 1.13* 1.05*  CALCIUM 8.5 8.6*  --  7.8* 7.6*  MG  --   --   --  1.5* 2.0   Liver Function Tests: Recent Labs  Lab  08/14/18 1036 08/18/18 1546 08/20/18 0444  AST 19 67* 56*  ALT 14 34 31  ALKPHOS 129* 138* 113  BILITOT 1.0 1.7* 0.8  PROT 7.7 7.8 6.0*  ALBUMIN 3.2* 2.6* 1.9*   No results for input(s): LIPASE, AMYLASE in the last 168 hours. No results for input(s): AMMONIA in the last 168 hours. CBC: Recent Labs  Lab 08/14/18 1036 08/18/18 1546 08/18/18 1605 08/19/18 0854 08/20/18 0444  WBC 10.5 11.0*  --  9.1 8.5  NEUTROABS  --  9.7*  --   --   --   HGB 12.5 12.7 14.6 11.0* 10.2*  HCT 38.6 40.6 43.0 35.4* 33.1*  MCV 86.1 86.0  --  85.7 86.2  PLT 236.0 202  --  158 159   Cardiac Enzymes: No results for input(s): CKTOTAL, CKMB, CKMBINDEX, TROPONINI in the last 168 hours. BNP:  Invalid input(s): POCBNP CBG: Recent Labs  Lab 08/19/18 1207 08/19/18 1621 08/19/18 2200 08/20/18 0650 08/20/18 1204  GLUCAP 91 83 205* 76 115*   D-Dimer No results for input(s): DDIMER in the last 72 hours. Hgb A1c No results for input(s): HGBA1C in the last 72 hours. Lipid Profile No results for input(s): CHOL, HDL, LDLCALC, TRIG, CHOLHDL, LDLDIRECT in the last 72 hours. Thyroid function studies No results for input(s): TSH, T4TOTAL, T3FREE, THYROIDAB in the last 72 hours.  Invalid input(s): FREET3 Anemia work up No results for input(s): VITAMINB12, FOLATE, FERRITIN, TIBC, IRON, RETICCTPCT in the last 72 hours. Urinalysis    Component Value Date/Time   COLORURINE ORANGE (A) 08/19/2018 1321   APPEARANCEUR TURBID (A) 08/19/2018 1321   APPEARANCEUR Clear 10/08/2014 0144   LABSPEC >1.030 (H) 08/19/2018 1321   LABSPEC 1.033 10/08/2014 0144   PHURINE 5.0 08/19/2018 1321   GLUCOSEU NEGATIVE 08/19/2018 1321   GLUCOSEU >=500 10/08/2014 0144   HGBUR LARGE (A) 08/19/2018 1321   BILIRUBINUR MODERATE (A) 08/19/2018 1321   BILIRUBINUR Negative 10/08/2014 0144   KETONESUR 15 (A) 08/19/2018 1321   PROTEINUR 100 (A) 08/19/2018 1321   UROBILINOGEN 0.2 05/27/2015 1936   NITRITE NEGATIVE 08/19/2018 1321    LEUKOCYTESUR MODERATE (A) 08/19/2018 1321   LEUKOCYTESUR Negative 10/08/2014 0144   Sepsis Labs Invalid input(s): PROCALCITONIN,  WBC,  LACTICIDVEN Microbiology No results found for this or any previous visit (from the past 240 hour(s)).  Please note: You were cared for by a hospitalist during your hospital stay. Once you are discharged, your primary care physician will handle any further medical issues.   Time coordinating discharge: 40 minutes  SIGNED:  Flora Lipps, MD  [facility] 08/20/2018, 3:54 PM

## 2018-08-21 ENCOUNTER — Telehealth: Payer: Self-pay

## 2018-08-21 ENCOUNTER — Telehealth: Payer: Self-pay | Admitting: *Deleted

## 2018-08-21 NOTE — Telephone Encounter (Signed)
Transition Care Management Follow-up Telephone Call   Date discharged?08/20/18    How have you been since you were released from the hospital? I have been hurting from my head to my toes after my falls.  I am taking flexeril.    Do you understand why you were in the hospital? Yes   Do you understand the discharge instructions? Yes   Where were you discharged to?Home with  [facility] to call and set up services.     Items Reviewed:  Medications reviewed: Yes  Allergies reviewed: Yes  Dietary changes reviewed: No changes in diet  Referrals reviewed: [facility] to set up   Functional Questionnaire:   Activities of Daily Living (ADLs):   She states they are independent in the following: dressing, bathing, grooming, toileting, feeding self and ambulation with a cane.  States they require assistance with the following: transportation and meal prep. Husband there and helps.   Any transportation issues/concerns?: Transportation is challenging but has support systems.    Any patient concerns?She has had multiple falls due to orthostatic hypotension.  She understands that she is to stay on metoprolol only per [name] phone note from today, and that she is to stop the Lisinopril.  Hospital discharge had her taking the lisinopril and metoprolol which was confusing her because [name], NP had not had her taking the Lisinopril prior to hospital visit and she was still hypotensive with syncopal event.  I tried to move her appointment up to Monday (12/16) with [name] but patient could not come before next Thursday (12/19) due to transportation issues.  She also has no way of checking blood pressures at home and cannot afford a meter.  Home health nursing to get involved and I have asked her to let me know if she does not get a call from them by 1pm tomorrow as she needs monitoring and in home nursing support as soon as possible.   Patient also instructed if  symptoms worsen or another fall occurs that she should go back to the ER in the meantime.  Patient verbalizes understanding.    Confirmed importance and date/time of follow-up visits scheduled Yes  Provider Appointment booked with [name], NP for 08/28/18 at 1140.   Confirmed with patient if condition begins to worsen call PCP or go to the ER.  Patient was given the office number and encouraged to call back with question or concerns.  Yes,

## 2018-08-21 NOTE — Telephone Encounter (Signed)
Pt called back. °

## 2018-08-21 NOTE — Telephone Encounter (Signed)
As discussed during her office visit last week, we only reinitiated her metoprolol tartrate, not her lisinopril.  We will plan to see her Monday as scheduled at 1140.

## 2018-08-21 NOTE — Telephone Encounter (Signed)
Spoken and notified patient of [name]'s comments. Patient verbalized understanding.  

## 2018-08-21 NOTE — Telephone Encounter (Signed)
Spoke to pt who states she was recently d/c from hospital but is unclear which medications she is to continue. She indicates that she was originally taken to the ED due to hypotension, which she contributes to the start of metoprolol on 12/5. Metoprolol is still on her medication list and pt is inquiring if she should be taking it in conjunction with Lisinopril. She is afraid if the dose is not reduced, her BP will drop and she will "fall out again."  pls advise

## 2018-08-21 NOTE — Telephone Encounter (Signed)
[name], NP,  Please review patient concerns below.  I am concerned for patient and have asked her to call me by midday tomorrow if home health has still not contacted her to establish care.  She desperately needs in home monitoring and support for blood pressure and blood sugar management.  Patient is not checking sugars or blood pressures at home and needs help with meters.    Please let me know if you need anything further for patient.  I will get involved with home health, if they do not call her and initiate care in a timely manner.   Thanks.

## 2018-08-21 NOTE — Telephone Encounter (Signed)
Thank you, much appreciated. She is very non compliant and has cancelled and no showed several appointments in the past.

## 2018-08-26 ENCOUNTER — Telehealth: Payer: Self-pay | Admitting: Primary Care

## 2018-08-26 DIAGNOSIS — E1122 Type 2 diabetes mellitus with diabetic chronic kidney disease: Secondary | ICD-10-CM | POA: Diagnosis not present

## 2018-08-26 DIAGNOSIS — J449 Chronic obstructive pulmonary disease, unspecified: Secondary | ICD-10-CM | POA: Diagnosis not present

## 2018-08-26 DIAGNOSIS — I480 Paroxysmal atrial fibrillation: Secondary | ICD-10-CM | POA: Diagnosis not present

## 2018-08-26 DIAGNOSIS — N183 Chronic kidney disease, stage 3 (moderate): Secondary | ICD-10-CM | POA: Diagnosis not present

## 2018-08-26 DIAGNOSIS — M519 Unspecified thoracic, thoracolumbar and lumbosacral intervertebral disc disorder: Secondary | ICD-10-CM | POA: Diagnosis not present

## 2018-08-26 DIAGNOSIS — I951 Orthostatic hypotension: Secondary | ICD-10-CM | POA: Diagnosis not present

## 2018-08-26 DIAGNOSIS — G894 Chronic pain syndrome: Secondary | ICD-10-CM | POA: Diagnosis not present

## 2018-08-26 DIAGNOSIS — I69354 Hemiplegia and hemiparesis following cerebral infarction affecting left non-dominant side: Secondary | ICD-10-CM | POA: Diagnosis not present

## 2018-08-26 DIAGNOSIS — I129 Hypertensive chronic kidney disease with stage 1 through stage 4 chronic kidney disease, or unspecified chronic kidney disease: Secondary | ICD-10-CM | POA: Diagnosis not present

## 2018-08-26 NOTE — Telephone Encounter (Signed)
Gave the approval for the verbal order. Will notified patient regarding metroprolol

## 2018-08-26 NOTE — Telephone Encounter (Signed)
Noted.  Approved PT and social work. Have the nurse stop her metoprolol tartrate if her blood pressure runs below 100/60. We will see patient as scheduled on 08/28/2018.

## 2018-08-26 NOTE — Telephone Encounter (Signed)
Need approval for PT in the home    Need to get Social Worker in home because the place is crawling with roaches.    Pt's BP was low and pt is currently taking  bp medication and pt don't drink water and may be dehydrated.

## 2018-08-28 ENCOUNTER — Ambulatory Visit (INDEPENDENT_AMBULATORY_CARE_PROVIDER_SITE_OTHER): Payer: Medicare HMO | Admitting: Primary Care

## 2018-08-28 ENCOUNTER — Encounter: Payer: Self-pay | Admitting: Primary Care

## 2018-08-28 VITALS — BP 140/64 | HR 127 | Temp 98.2°F | Ht 63.0 in | Wt 124.2 lb

## 2018-08-28 DIAGNOSIS — E876 Hypokalemia: Secondary | ICD-10-CM

## 2018-08-28 DIAGNOSIS — M542 Cervicalgia: Secondary | ICD-10-CM

## 2018-08-28 DIAGNOSIS — I480 Paroxysmal atrial fibrillation: Secondary | ICD-10-CM

## 2018-08-28 DIAGNOSIS — I10 Essential (primary) hypertension: Secondary | ICD-10-CM | POA: Diagnosis not present

## 2018-08-28 DIAGNOSIS — I951 Orthostatic hypotension: Secondary | ICD-10-CM

## 2018-08-28 DIAGNOSIS — G8929 Other chronic pain: Secondary | ICD-10-CM

## 2018-08-28 DIAGNOSIS — M549 Dorsalgia, unspecified: Secondary | ICD-10-CM

## 2018-08-28 DIAGNOSIS — E785 Hyperlipidemia, unspecified: Secondary | ICD-10-CM

## 2018-08-28 DIAGNOSIS — E1165 Type 2 diabetes mellitus with hyperglycemia: Secondary | ICD-10-CM | POA: Diagnosis not present

## 2018-08-28 DIAGNOSIS — I495 Sick sinus syndrome: Secondary | ICD-10-CM

## 2018-08-28 LAB — BASIC METABOLIC PANEL
BUN: 11 mg/dL (ref 6–23)
CHLORIDE: 101 meq/L (ref 96–112)
CO2: 23 mEq/L (ref 19–32)
CREATININE: 0.92 mg/dL (ref 0.40–1.20)
Calcium: 8.4 mg/dL (ref 8.4–10.5)
GFR: 65.18 mL/min (ref >60.00)
Glucose, Bld: 300 mg/dL — ABNORMAL HIGH (ref 70–99)
Potassium: 3.6 mEq/L (ref 3.5–5.1)
Sodium: 133 mEq/L — ABNORMAL LOW (ref 135–145)

## 2018-08-28 MED ORDER — GLUCOSE BLOOD VI STRP: ORAL_STRIP | 2 refills | Status: DC

## 2018-08-28 MED ORDER — CONTOUR BLOOD GLUCOSE SYSTEM W/DEVICE KIT: PACK | 0 refills | Status: DC

## 2018-08-28 MED ORDER — CYCLOBENZAPRINE HCL 10 MG PO TABS: 10.0000 mg | ORAL_TABLET | Freq: Three times a day (TID) | ORAL | 0 refills | Status: DC | PRN

## 2018-08-28 MED ORDER — METOPROLOL TARTRATE 25 MG PO TABS: 12.5000 mg | ORAL_TABLET | Freq: Two times a day (BID) | ORAL | 0 refills | Status: DC

## 2018-08-28 MED ORDER — LANCET DEVICE MISC: 2 refills | Status: DC

## 2018-08-28 NOTE — Assessment & Plan Note (Signed)
Repeat potassium level pending.  Consider initiating potassium 20 mEq tablets daily.

## 2018-08-28 NOTE — Assessment & Plan Note (Signed)
A1c of 14.6 during her last visit several weeks ago.  Levemir 15 units twice daily was initiated.  Glucometer prescription sent to pharmacy, however she cannot figure out how to use this and did not bring it to the clinic today.  Given such high A1c during last visit we will continue Levemir 15 units twice daily.  Strongly advised she go to the pharmacy today for a new glucometer so that she can start checking her glucose at least twice daily.  We sent a new prescription to her pharmacy for a new glucometer, we discussed to have the pharmacist show her how to use the glucometer.  Follow-up in 3 weeks with glucose logs.

## 2018-08-28 NOTE — Assessment & Plan Note (Signed)
Sinus tachycardia with heart rate of 127 today. We will reinitiate metoprolol tartrate at 12.5 mg twice daily. Will have home health monitor blood pressure and heart rate.

## 2018-08-28 NOTE — Progress Notes (Signed)
Subjective:    Patient ID: [name], female    DOB: 06-04-1956, 62 y.o.   MRN: [id number]  HPI  Ms. Runions is a 62 year old female with a history of hypertension, orthostatic hypotension, uncontrolled diabetes, CKD, medical noncompliance, chronic pain syndrome who presents today for hospital follow up and general follow up.  She presented to [facility] on [facility]18/18 with a chief complaint of hypotension, difficulty ambulating, syncope, 6 falls. Prior admission on 05/13/2018 for DKA.   She had recently been in our office on 08/14/18 after months of non compliance to her medications and her medications were slowly re-introduced including Levemir, Eliquis, metoprolol tartrate, atorvastatin.  During that visit she was noted to have sinus tachycardia and an A1c of 14.  During her ED stay she was noted to have orthostatic hypotension. CT head and C-Spine was negative. Xray of the left hip was negative for fracture. ECG with sinus bradycardia. She was hydrated with IV fluids and admitted for further evaluation.   During her hospital stay her anti-hypertensive medications were held. She underwent MRI of the left hip which was negative. She underwent nutrition and PT consultation.   A right ureteral stent was noted on imaging with moderate right hydronephrosis and hydroureter suggesting stent malfunction. UA was positive for leukocytes, negative nitrites and blood. She never followed up with Urology in the outpatient setting for stent removal. Urology was consulted who recommended outpatient evaluation for stent removal. They did not recommend antibiotics for urinary leukocytes unless urine culture suggested otherwise.  Urine culture not in EMR system.  She was noted to have a weight loss of 25 pounds, gastroparesis vs esophageal candida was questioned so oral nystatin and Reglan was initiated. It was recommended she resume potassium chloride 20 mEq daily for hypokalemia with potassium at 2.6 then 2.9.    She was discharged home earlier than preferred given profound hypokalemia , but patient demanded to be released.  She was treated for hypokalemia during her stay.  She was discharged home on 08/20/18 with home health nursing and physical therapy services.   BP Readings from Last 3 Encounters:  08/28/18 140/64  08/20/18 106/60  08/14/18 (!) 148/72   Since her last visit she's compliant to her Levemir insulin 15 units twice daily. She is not checking her blood sugars as she doesn't know how to work her new glucometer. The pharmacist didn't know how to work the glucometer.  She did not bring her machine in today. She wants to be prescribed her prior meter which she believes to be contour.  She is not taking her metoprolol tartrate. Her home health nurse is checking her blood pressure which was "a little low". She has her medications with her today and is taking Eliquis and atorvastatin.  She is also being visited by the home health physical therapist. She denies falls since she's been home.   She is requesting a refill of her Flexeril for chronic back pain. She was once following with pain management and managed on oxycodone ER 9 mg.  She was recently released from her pain management clinic due to negative urine drug screen with prescribed medication.  She was referred to another pain management clinic and was declined.  Review of Systems  Constitutional: Negative for fever.  Eyes: Negative for visual disturbance.  Respiratory: Negative for cough and shortness of breath.   Cardiovascular: Negative for chest pain.  Genitourinary: Negative for frequency.  Musculoskeletal: Positive for arthralgias and back pain.  Neurological:  Intermittent lightheadedness, denies falls       Past Medical History:  Diagnosis Date  . Acute encephalopathy 05/22/2018  . Allergy   . Anxiety   . Ascites   . C. difficile colitis 07/10/2015  . Cancer (HCC)    HX OF CANCER OF UTERUS   . Cirrhosis of  liver not due to alcohol ([place]) 2016  . Degenerative disk disease   . Diverticulitis   . Gastroparesis   . GERD (gastroesophageal reflux disease)   . History of hiatal hernia   . Hypertension   . Hypothyroid   . Hypothyroidism due to amiodarone   . Ileus ([place]) 08/01/2015  . Intussusception intestine ([place]) 05/2015  . Orthostatic hypotension   . PAF (paroxysmal atrial fibrillation) ([place]) 03/2015   a. new onset 03/2015 in setting of intractable N/V; b. on Eliquis 5 mg bid; c. CHADSVASc 4 (DM, TIA x 2, female)  . Pancreatitis   . Pneumonia 11/14/2015  . Right ureteral stone 07/14/2016  . Sick sinus syndrome ([place])   . Stomach ulcer   . Stroke [facility])    with minimal left sided weakness  . Syncope 01/2015  . Syncope due to orthostatic hypotension 05/18/2015  . Tachyarrhythmia 01/10/2016  . TIA (transient ischemic attack) 02/2015  . Type 1 diabetes ([place])    on levemir  . UTERINE CANCER, HX OF 03/27/2007   Qualifier: Diagnosis of  By: Maxie Better FNP, Rosalita Levan   . UTI (urinary tract infection) 05/22/2018     Social History   Socioeconomic History  . Marital status: Married    Spouse name: Not on file  . Number of children: Not on file  . Years of education: Not on file  . Highest education level: Not on file  Occupational History  . Occupation: Disabled 2nd back problems  Social Needs  . Financial resource strain: Not on file  . Food insecurity:    Worry: Not on file    Inability: Not on file  . Transportation needs:    Medical: Not on file    Non-medical: Not on file  Tobacco Use  . Smoking status: Former Smoker    Types: Cigarettes  . Smokeless tobacco: Never Used  . Tobacco comment: 25 years ago and only smoked occasionally  Substance and Sexual Activity  . Alcohol use: No  . Drug use: No  . Sexual activity: Not Currently  Lifestyle  . Physical activity:    Days per week: Not on file    Minutes per session: Not on file  . Stress: Not on file  Relationships  . Social  connections:    Talks on phone: Not on file    Gets together: Not on file    Attends religious service: Not on file    Active member of club or organization: Not on file    Attends meetings of clubs or organizations: Not on file    Relationship status: Not on file  . Intimate partner violence:    Fear of current or ex partner: Not on file    Emotionally abused: Not on file    Physically abused: Not on file    Forced sexual activity: Not on file  Other Topics Concern  . Not on file  Social History Narrative   Lives in [place], [place] with her husband and 2 sons.    Past Surgical History:  Procedure Laterality Date  . ABDOMINAL HYSTERECTOMY    . CARDIAC CATHETERIZATION N/A 01/12/2016   Procedure: Left Heart Cath  and Coronary Angiography;  Surgeon: Wellington Hampshire, MD;  Location: [place] [facility];  Service: Cardiovascular;  Laterality: N/A;  . CHOLECYSTECTOMY    . CYSTOSCOPY/URETEROSCOPY/HOLMIUM LASER Right 07/14/2016   Procedure: CYSTOSCOPY/URETEROSCOPY/HOLMIUM LASER;  Surgeon: Alexis Frock, MD;  Location: [facility] ORS;  Service: Urology;  Laterality: Right;  . ESOPHAGOGASTRODUODENOSCOPY N/A 04/04/2015   Procedure: ESOPHAGOGASTRODUODENOSCOPY (EGD);  Surgeon: Hulen Luster, MD;  Location: [facility] ENDOSCOPY;  Service: Endoscopy;  Laterality: N/A;  . ESOPHAGOGASTRODUODENOSCOPY N/A 12/28/2017   Procedure: ESOPHAGOGASTRODUODENOSCOPY (EGD);  Surgeon: Lin Landsman, MD;  Location: [facility] ENDOSCOPY;  Service: Gastroenterology;  Laterality: N/A;  . ESOPHAGOGASTRODUODENOSCOPY (EGD) WITH PROPOFOL N/A 01/18/2016   Procedure: ESOPHAGOGASTRODUODENOSCOPY (EGD) WITH PROPOFOL;  Surgeon: Lucilla Lame, MD;  Location: [facility] ENDOSCOPY;  Service: Endoscopy;  Laterality: N/A;  . FLEXIBLE SIGMOIDOSCOPY N/A 01/18/2016   Procedure: FLEXIBLE SIGMOIDOSCOPY;  Surgeon: Lucilla Lame, MD;  Location: [facility] ENDOSCOPY;  Service: Endoscopy;  Laterality: N/A;  . HERNIA REPAIR      Family History  Problem Relation Age of Onset  .  Hypertension Mother   . CAD Sister   . Heart attack Sister        Deceased 12-Nov-2014  . CAD Brother     Allergies  Allergen Reactions  . Hydrocodone Other (See Comments)    Pt states that this medication caused cirrhosis of the liver.    . Aspirin   . Erythromycin Other (See Comments)    Reaction:  Fever   . Prednisone Other (See Comments)    Reaction:  Unknown   . Rosiglitazone Maleate Swelling  . Codeine Sulfate Rash  . Tetanus-Diphtheria Toxoids Td Rash and Other (See Comments)    Reaction:  Fever     Current Outpatient Medications on File Prior to Visit  Medication Sig Dispense Refill  . apixaban (ELIQUIS) 5 MG TABS tablet Take 1 tablet (5 mg total) by mouth 2 (two) times daily. 180 tablet 0  . atorvastatin (LIPITOR) 40 MG tablet Take 1 tablet (40 mg total) by mouth daily. For cholesterol. 90 tablet 0  . Insulin Detemir (LEVEMIR FLEXPEN) 100 UNIT/ML Pen Inject 15 Units into the skin 2 (two) times daily. 15 mL 2  . albuterol (PROVENTIL HFA;VENTOLIN HFA) 108 (90 Base) MCG/ACT inhaler Inhale 2 puffs into the lungs every 6 (six) hours as needed for wheezing or shortness of breath. (Patient not taking: Reported on 08/28/2018) 1 Inhaler 0   No current facility-administered medications on file prior to visit.     BP 140/64   Pulse (!) 127   Temp 98.2 F (36.8 C) (Oral)   Ht 5' 3"  (1.6 m)   Wt 124 lb 4 oz (56.4 kg)   SpO2 98%   BMI 22.01 kg/m    Objective:   Physical Exam  Constitutional: She appears well-nourished.  Neck: Neck supple.  Cardiovascular: Normal rate and regular rhythm.  Respiratory: Effort normal and breath sounds normal.  Skin: Skin is warm and dry.  Psychiatric: She has a normal mood and affect.           Assessment & Plan:

## 2018-08-28 NOTE — Assessment & Plan Note (Signed)
Recent hospital admission for same.  Could be secondary to dehydration with initiation of metoprolol tartrate 25 mg twice daily. Blood pressure is 140/64 in the office today off of medications, however heart rate of 127. Will reinitiate metoprolol tartrate at 12.5 mg twice daily, discussed for her to have home health nursing and PT monitor blood pressure and heart rate and send me readings.  All hospital notes, labs, imaging reviewed.  Follow-up in 3 weeks.

## 2018-08-28 NOTE — Assessment & Plan Note (Signed)
Borderline today, however with sinus tachycardia.  Will reinitiate metoprolol at 12.5 mg twice daily, she verbalized understanding.  I also requested to have her physical therapist/home health nurse call me with blood pressure readings.

## 2018-08-28 NOTE — Patient Instructions (Addendum)
Resume your metoprolol tartrate to 12.5 mg twice daily. This is 1/2 tablet twice daily.  Please have the physical therapist and home health nurse monitor your blood pressure and heart rate. Please have them call me with your readings.  Continue taking your apixaban (Eliquis) medication twice daily.  Continue taking your atorvastatin medication daily.  Continue your Levemir 15 units twice daily for now. You MUST start checking your blood sugars twice daily: Before any meal 2 hours after any meal Bedtime   Stop by the lab prior to leaving today. I will notify you of your results once received.   Stop by the front desk and speak with either Rosaria Ferries or Anastasiya regarding your referral to Urology.  Use the cyclobenzaprine (Flexeril) as needed for muscle spasms.  Schedule a follow up visit with me in 3 weeks for diabetes and blood pressure check.  It was a pleasure to see you today!

## 2018-08-28 NOTE — Assessment & Plan Note (Signed)
Compliant to apixaban 5 mg twice daily.  Continue same.

## 2018-08-28 NOTE — Assessment & Plan Note (Signed)
Atorvastatin was reinitiated during her last visit.  Lipid panel next visit.

## 2018-08-28 NOTE — Assessment & Plan Note (Signed)
Refill provided for Flexeril.  Discussed that I will not be prescribing narcotics.

## 2018-08-29 DIAGNOSIS — E1122 Type 2 diabetes mellitus with diabetic chronic kidney disease: Secondary | ICD-10-CM | POA: Diagnosis not present

## 2018-08-29 DIAGNOSIS — N183 Chronic kidney disease, stage 3 (moderate): Secondary | ICD-10-CM | POA: Diagnosis not present

## 2018-08-29 DIAGNOSIS — I69354 Hemiplegia and hemiparesis following cerebral infarction affecting left non-dominant side: Secondary | ICD-10-CM | POA: Diagnosis not present

## 2018-08-29 DIAGNOSIS — J449 Chronic obstructive pulmonary disease, unspecified: Secondary | ICD-10-CM | POA: Diagnosis not present

## 2018-08-29 DIAGNOSIS — M519 Unspecified thoracic, thoracolumbar and lumbosacral intervertebral disc disorder: Secondary | ICD-10-CM | POA: Diagnosis not present

## 2018-08-29 DIAGNOSIS — I480 Paroxysmal atrial fibrillation: Secondary | ICD-10-CM | POA: Diagnosis not present

## 2018-08-29 DIAGNOSIS — G894 Chronic pain syndrome: Secondary | ICD-10-CM | POA: Diagnosis not present

## 2018-08-29 DIAGNOSIS — I129 Hypertensive chronic kidney disease with stage 1 through stage 4 chronic kidney disease, or unspecified chronic kidney disease: Secondary | ICD-10-CM | POA: Diagnosis not present

## 2018-08-29 DIAGNOSIS — I951 Orthostatic hypotension: Secondary | ICD-10-CM | POA: Diagnosis not present

## 2018-09-01 DIAGNOSIS — G894 Chronic pain syndrome: Secondary | ICD-10-CM | POA: Diagnosis not present

## 2018-09-01 DIAGNOSIS — I951 Orthostatic hypotension: Secondary | ICD-10-CM | POA: Diagnosis not present

## 2018-09-01 DIAGNOSIS — I480 Paroxysmal atrial fibrillation: Secondary | ICD-10-CM | POA: Diagnosis not present

## 2018-09-01 DIAGNOSIS — M519 Unspecified thoracic, thoracolumbar and lumbosacral intervertebral disc disorder: Secondary | ICD-10-CM | POA: Diagnosis not present

## 2018-09-01 DIAGNOSIS — E1122 Type 2 diabetes mellitus with diabetic chronic kidney disease: Secondary | ICD-10-CM | POA: Diagnosis not present

## 2018-09-01 DIAGNOSIS — N183 Chronic kidney disease, stage 3 (moderate): Secondary | ICD-10-CM | POA: Diagnosis not present

## 2018-09-01 DIAGNOSIS — I69354 Hemiplegia and hemiparesis following cerebral infarction affecting left non-dominant side: Secondary | ICD-10-CM | POA: Diagnosis not present

## 2018-09-01 DIAGNOSIS — I129 Hypertensive chronic kidney disease with stage 1 through stage 4 chronic kidney disease, or unspecified chronic kidney disease: Secondary | ICD-10-CM | POA: Diagnosis not present

## 2018-09-01 DIAGNOSIS — J449 Chronic obstructive pulmonary disease, unspecified: Secondary | ICD-10-CM | POA: Diagnosis not present

## 2018-09-08 ENCOUNTER — Telehealth: Payer: Self-pay

## 2018-09-08 DIAGNOSIS — E1122 Type 2 diabetes mellitus with diabetic chronic kidney disease: Secondary | ICD-10-CM | POA: Diagnosis not present

## 2018-09-08 DIAGNOSIS — I480 Paroxysmal atrial fibrillation: Secondary | ICD-10-CM | POA: Diagnosis not present

## 2018-09-08 DIAGNOSIS — N183 Chronic kidney disease, stage 3 (moderate): Secondary | ICD-10-CM | POA: Diagnosis not present

## 2018-09-08 DIAGNOSIS — M519 Unspecified thoracic, thoracolumbar and lumbosacral intervertebral disc disorder: Secondary | ICD-10-CM | POA: Diagnosis not present

## 2018-09-08 DIAGNOSIS — I951 Orthostatic hypotension: Secondary | ICD-10-CM | POA: Diagnosis not present

## 2018-09-08 DIAGNOSIS — I69354 Hemiplegia and hemiparesis following cerebral infarction affecting left non-dominant side: Secondary | ICD-10-CM | POA: Diagnosis not present

## 2018-09-08 DIAGNOSIS — G894 Chronic pain syndrome: Secondary | ICD-10-CM | POA: Diagnosis not present

## 2018-09-08 DIAGNOSIS — I129 Hypertensive chronic kidney disease with stage 1 through stage 4 chronic kidney disease, or unspecified chronic kidney disease: Secondary | ICD-10-CM | POA: Diagnosis not present

## 2018-09-08 DIAGNOSIS — J449 Chronic obstructive pulmonary disease, unspecified: Secondary | ICD-10-CM | POA: Diagnosis not present

## 2018-09-08 NOTE — Telephone Encounter (Signed)
That is worrisome - I have not seen her before but her hx is complex- agree with advisement to go to ED or UC  Will cc PCP

## 2018-09-08 NOTE — Telephone Encounter (Signed)
Agree to ED evaluation. At this point I don't see where she's sought care, did she go anywhere? She needs evaluation.

## 2018-09-08 NOTE — Telephone Encounter (Signed)
[name] PT with [facility] said that pt fell on 09/05/18 and pt was not sure if she fell and was knocked out or whether she fainted after a dizzy spell; pt not using cane and there was no physical bruising or injury noted by [place]. Pt also has pitting edema 3+ pitting edema in lt foot and 2+ pitting edema in rt foot. Vitals signs are good and lungs sound good. No redness noted. No difficulty breathing.  I spoke with pt and on 09/05/18 pt was walking from bedroom to kitchen and when she woke up she was starring at the ceiling.  3 days ago  New symptom of both feet swelling. When pt sitting she has her feet elevated in recliner and when sleeps at night the swelling does not go down. There are no available appts at [facility] and pt will have her husband take her to [facility] for evaluation. FYI to [name] NP who is out of office and Dr Glori Bickers who is in the office.

## 2018-09-09 NOTE — Telephone Encounter (Signed)
Tried to call patient 3 times this morning. The phone kept ringing with no response and no voicemail.

## 2018-09-11 ENCOUNTER — Telehealth: Payer: Self-pay | Admitting: *Deleted

## 2018-09-11 NOTE — Telephone Encounter (Signed)
Patient has appt with [name] on 09/12/2018

## 2018-09-11 NOTE — Telephone Encounter (Signed)
Noted  

## 2018-09-11 NOTE — Telephone Encounter (Signed)
Spoke to pt who states she "blacked out" on yesterday 09/10/2017. She states she arose from her chair and took appx 3 steps and "fell flat on (her) face." she states she is feeling ok today, and has "remained seated" to prevent any additional falls. She reports that her fainting is not a new occurrence, and [name] is aware of her past Hx. Pt also reports that she is still having "a lot of swelling" in her legs and feet, and hasnt had much change since 12/30. At [facility] request she was scheduled for 1/3. Pt also has an appt 1/9 that she will keep in the event [name] is wanting her to f/u, if not she will cancel before leaving the office.

## 2018-09-12 ENCOUNTER — Ambulatory Visit
Admission: RE | Admit: 2018-09-12 | Discharge: 2018-09-12 | Disposition: A | Discharge status: Home / Self Care | Payer: Medicare Other | Attending: Primary Care | Admitting: Primary Care

## 2018-09-12 ENCOUNTER — Ambulatory Visit (INDEPENDENT_AMBULATORY_CARE_PROVIDER_SITE_OTHER): Payer: Medicare Other | Admitting: Primary Care

## 2018-09-12 ENCOUNTER — Encounter: Payer: Self-pay | Admitting: Primary Care

## 2018-09-12 ENCOUNTER — Ambulatory Visit
Admission: RE | Admit: 2018-09-12 | Discharge: 2018-09-12 | Disposition: A | Discharge status: Home / Self Care | Payer: Medicare Other | Source: Ambulatory Visit | Attending: Primary Care | Admitting: Primary Care

## 2018-09-12 ENCOUNTER — Telehealth: Payer: Self-pay | Admitting: Primary Care

## 2018-09-12 VITALS — BP 146/92 | HR 131 | Temp 98.2°F | Ht 63.0 in | Wt 134.5 lb

## 2018-09-12 DIAGNOSIS — I69354 Hemiplegia and hemiparesis following cerebral infarction affecting left non-dominant side: Secondary | ICD-10-CM | POA: Diagnosis not present

## 2018-09-12 DIAGNOSIS — R000 Tachycardia, unspecified: Secondary | ICD-10-CM | POA: Diagnosis not present

## 2018-09-12 DIAGNOSIS — R296 Repeated falls: Secondary | ICD-10-CM

## 2018-09-12 DIAGNOSIS — R51 Headache: Principal | ICD-10-CM

## 2018-09-12 DIAGNOSIS — R220 Localized swelling, mass and lump, head: Secondary | ICD-10-CM

## 2018-09-12 DIAGNOSIS — I495 Sick sinus syndrome: Secondary | ICD-10-CM

## 2018-09-12 DIAGNOSIS — I480 Paroxysmal atrial fibrillation: Secondary | ICD-10-CM | POA: Diagnosis not present

## 2018-09-12 DIAGNOSIS — I214 Non-ST elevation (NSTEMI) myocardial infarction: Secondary | ICD-10-CM | POA: Diagnosis not present

## 2018-09-12 DIAGNOSIS — E1143 Type 2 diabetes mellitus with diabetic autonomic (poly)neuropathy: Secondary | ICD-10-CM | POA: Diagnosis not present

## 2018-09-12 DIAGNOSIS — J449 Chronic obstructive pulmonary disease, unspecified: Secondary | ICD-10-CM | POA: Diagnosis not present

## 2018-09-12 DIAGNOSIS — R519 Headache, unspecified: Secondary | ICD-10-CM

## 2018-09-12 DIAGNOSIS — S022XXA Fracture of nasal bones, initial encounter for closed fracture: Secondary | ICD-10-CM | POA: Diagnosis not present

## 2018-09-12 DIAGNOSIS — E785 Hyperlipidemia, unspecified: Secondary | ICD-10-CM | POA: Diagnosis not present

## 2018-09-12 DIAGNOSIS — Z794 Long term (current) use of insulin: Secondary | ICD-10-CM | POA: Diagnosis not present

## 2018-09-12 DIAGNOSIS — J3489 Other specified disorders of nose and nasal sinuses: Secondary | ICD-10-CM | POA: Insufficient documentation

## 2018-09-12 DIAGNOSIS — I129 Hypertensive chronic kidney disease with stage 1 through stage 4 chronic kidney disease, or unspecified chronic kidney disease: Secondary | ICD-10-CM | POA: Diagnosis not present

## 2018-09-12 DIAGNOSIS — I951 Orthostatic hypotension: Secondary | ICD-10-CM

## 2018-09-12 DIAGNOSIS — G894 Chronic pain syndrome: Secondary | ICD-10-CM | POA: Diagnosis not present

## 2018-09-12 DIAGNOSIS — Z9181 History of falling: Secondary | ICD-10-CM | POA: Diagnosis not present

## 2018-09-12 DIAGNOSIS — E1165 Type 2 diabetes mellitus with hyperglycemia: Secondary | ICD-10-CM

## 2018-09-12 DIAGNOSIS — N183 Chronic kidney disease, stage 3 (moderate): Secondary | ICD-10-CM | POA: Diagnosis not present

## 2018-09-12 DIAGNOSIS — Z7901 Long term (current) use of anticoagulants: Secondary | ICD-10-CM | POA: Diagnosis not present

## 2018-09-12 DIAGNOSIS — E1122 Type 2 diabetes mellitus with diabetic chronic kidney disease: Secondary | ICD-10-CM | POA: Diagnosis not present

## 2018-09-12 DIAGNOSIS — S0993XA Unspecified injury of face, initial encounter: Secondary | ICD-10-CM | POA: Diagnosis not present

## 2018-09-12 DIAGNOSIS — Z79899 Other long term (current) drug therapy: Secondary | ICD-10-CM | POA: Diagnosis not present

## 2018-09-12 LAB — LIPID PANEL
Cholesterol: 104 mg/dL (ref 0–200)
HDL: 43.4 mg/dL (ref >39.00)
LDL CALC: 42 mg/dL (ref 0–99)
NonHDL: 60.16
Total CHOL/HDL Ratio: 2
Triglycerides: 90 mg/dL (ref 0.0–149.0)
VLDL: 18 mg/dL (ref 0.0–40.0)

## 2018-09-12 NOTE — Assessment & Plan Note (Signed)
Frail with complex medical history. Already with home health PT visiting twice weekly. Check facial bones today given recent fall with swelling and tenderness on exam. She is alert and oriented and asymptomatic, doesn't show signs cranial bleeding. Consider CT head if she develops any dizziness, headaches, confusion.

## 2018-09-12 NOTE — Telephone Encounter (Signed)
Stacey/PT with [facility] called office requesting verbal order for PT twice a week for 3 weeks. Also if nursing can be added for diabetes management. Best cb # [phone], can lvm.

## 2018-09-12 NOTE — Assessment & Plan Note (Signed)
Still not checking glucose levels. She will be picking up a new meter today. Advised to check 2-3 times daily, rotating checks.

## 2018-09-12 NOTE — Assessment & Plan Note (Signed)
Tachycardia on the last several visits, she is compliant to metoprolol. Also with recent fall likely due to orthostatic hypotension.  Referral placed to cardiology for evaluation.

## 2018-09-12 NOTE — Assessment & Plan Note (Signed)
Likely the cause for her recent fall. Referral placed to cardiology.

## 2018-09-12 NOTE — Telephone Encounter (Signed)
Most certainly approved.

## 2018-09-12 NOTE — Patient Instructions (Signed)
Stop by the lab prior to leaving today. I will notify you of your results once received.   Stop by the front desk and speak with either [name] or [name] regarding your referral to Cardiology.  Go to [place].  Cancel the appointment for January 9th, reschedule for 3 weeks. Bring your diabetes logs.  It was a pleasure to see you today!

## 2018-09-12 NOTE — Progress Notes (Signed)
Subjective:    Patient ID: [name], female    DOB: 1956/07/03, 63 y.o.   MRN: [id number]  HPI  [name] is a 62 year old female with a complex medical history which includes (not limited to) uncontrolled diabetes, NSTEMI, hypertension, cirrhosis, narcotic abuse, orthostatic hypotension, recurrent falls who presents today with a chief complaint of fall.  She feel on 09/10/2018. She was rising from the couch, took 2-3 steps, then fell forward onto her face. She was "out" for 2-3 minutes. Her son was nearby who picked her up and sat her on the couch. She did not trip. She is compliant to her metoprolol.   She also endorses bilateral lower extremity swelling from her feet up to her lower calf for the last 5 days.   BP Readings from Last 3 Encounters:  09/12/18 (!) 146/92  08/28/18 140/64  08/20/18 106/60   She's not checking her glucose levels as she's continuing to have difficulty working her meter. She is in process of getting a new meter and should be getting this today.  She denies dizziness, headaches, visual changes. Her family member is here today who denies confusion/altered mental status. She has noticed some nose bleeds and anterior swelling to her nose since the fall. She is visited by home health physical therapy twice weekly, they have not been out since the fall.  Review of Systems  Constitutional: Negative for fever.  HENT:       Nasal pain and swelling. Some bleeding since fall  Respiratory: Negative for shortness of breath.   Cardiovascular: Positive for leg swelling. Negative for chest pain.  Neurological: Negative for dizziness and headaches.       Past Medical History:  Diagnosis Date  . Acute encephalopathy 05/22/2018  . Allergy   . Anxiety   . Ascites   . C. difficile colitis 07/10/2015  . Cancer (HCC)    HX OF CANCER OF UTERUS   . Cirrhosis of liver not due to alcohol ([place]) 2016  . Degenerative disk disease   . Diverticulitis   . Gastroparesis     . GERD (gastroesophageal reflux disease)   . History of hiatal hernia   . Hypertension   . Hypothyroid   . Hypothyroidism due to amiodarone   . Ileus ([place]) 08/01/2015  . Intussusception intestine ([place]) 05/2015  . Orthostatic hypotension   . PAF (paroxysmal atrial fibrillation) ([place]) 03/2015   a. new onset 03/2015 in setting of intractable N/V; b. on Eliquis 5 mg bid; c. CHADSVASc 4 (DM, TIA x 2, female)  . Pancreatitis   . Pneumonia 11/14/2015  . Right ureteral stone 07/14/2016  . Sick sinus syndrome ([place])   . Stomach ulcer   . Stroke [facility])    with minimal left sided weakness  . Syncope 01/2015  . Syncope due to orthostatic hypotension 05/18/2015  . Tachyarrhythmia 01/10/2016  . TIA (transient ischemic attack) 02/2015  . Type 1 diabetes ([place])    on levemir  . UTERINE CANCER, HX OF 03/27/2007   Qualifier: Diagnosis of  By: Maxie Better FNP, Rosalita Levan   . UTI (urinary tract infection) 05/22/2018     Social History   Socioeconomic History  . Marital status: Married    Spouse name: Not on file  . Number of children: Not on file  . Years of education: Not on file  . Highest education level: Not on file  Occupational History  . Occupation: Disabled 2nd back problems  Social Needs  . Financial  resource strain: Not on file  . Food insecurity:    Worry: Not on file    Inability: Not on file  . Transportation needs:    Medical: Not on file    Non-medical: Not on file  Tobacco Use  . Smoking status: Former Smoker    Types: Cigarettes  . Smokeless tobacco: Never Used  . Tobacco comment: 25 years ago and only smoked occasionally  Substance and Sexual Activity  . Alcohol use: No  . Drug use: No  . Sexual activity: Not Currently  Lifestyle  . Physical activity:    Days per week: Not on file    Minutes per session: Not on file  . Stress: Not on file  Relationships  . Social connections:    Talks on phone: Not on file    Gets together: Not on file    Attends religious service:  Not on file    Active member of club or organization: Not on file    Attends meetings of clubs or organizations: Not on file    Relationship status: Not on file  . Intimate partner violence:    Fear of current or ex partner: Not on file    Emotionally abused: Not on file    Physically abused: Not on file    Forced sexual activity: Not on file  Other Topics Concern  . Not on file  Social History Narrative   Lives in [place], [place] with her husband and 2 sons.    Past Surgical History:  Procedure Laterality Date  . ABDOMINAL HYSTERECTOMY    . CARDIAC CATHETERIZATION N/A 01/12/2016   Procedure: Left Heart Cath and Coronary Angiography;  Surgeon: Wellington Hampshire, MD;  Location: [place] CV LAB;  Service: Cardiovascular;  Laterality: N/A;  . CHOLECYSTECTOMY    . CYSTOSCOPY/URETEROSCOPY/HOLMIUM LASER Right 07/14/2016   Procedure: CYSTOSCOPY/URETEROSCOPY/HOLMIUM LASER;  Surgeon: Alexis Frock, MD;  Location: [facility] ORS;  Service: Urology;  Laterality: Right;  . ESOPHAGOGASTRODUODENOSCOPY N/A 04/04/2015   Procedure: ESOPHAGOGASTRODUODENOSCOPY (EGD);  Surgeon: Hulen Luster, MD;  Location: [facility] ENDOSCOPY;  Service: Endoscopy;  Laterality: N/A;  . ESOPHAGOGASTRODUODENOSCOPY N/A 12/28/2017   Procedure: ESOPHAGOGASTRODUODENOSCOPY (EGD);  Surgeon: Lin Landsman, MD;  [facility] ENDOSCOPY;  Service: Gastroenterology;  Laterality: N/A;  . ESOPHAGOGASTRODUODENOSCOPY (EGD) WITH PROPOFOL N/A 01/18/2016   Procedure: ESOPHAGOGASTRODUODENOSCOPY (EGD) WITH PROPOFOL;  Surgeon: Lucilla Lame, MD;  Location: [facility] ENDOSCOPY;  Service: Endoscopy;  Laterality: N/A;  . FLEXIBLE SIGMOIDOSCOPY N/A 01/18/2016   Procedure: FLEXIBLE SIGMOIDOSCOPY;  Surgeon: Lucilla Lame, MD;  Location: [facility] ENDOSCOPY;  Service: Endoscopy;  Laterality: N/A;  . HERNIA REPAIR      Family History  Problem Relation Age of Onset  . Hypertension Mother   . CAD Sister   . Heart attack Sister        Deceased 11/18/14  . CAD Brother      Allergies  Allergen Reactions  . Hydrocodone Other (See Comments)    Pt states that this medication caused cirrhosis of the liver.    . Aspirin   . Erythromycin Other (See Comments)    Reaction:  Fever   . Prednisone Other (See Comments)    Reaction:  Unknown   . Rosiglitazone Maleate Swelling  . Codeine Sulfate Rash  . Tetanus-Diphtheria Toxoids Td Rash and Other (See Comments)    Reaction:  Fever     Current Outpatient Medications on File Prior to Visit  Medication Sig Dispense Refill  . apixaban (ELIQUIS) 5 MG TABS tablet Take  1 tablet (5 mg total) by mouth 2 (two) times daily. 180 tablet 0  . atorvastatin (LIPITOR) 40 MG tablet Take 1 tablet (40 mg total) by mouth daily. For cholesterol. 90 tablet 0  . Blood Glucose Monitoring Suppl (CONTOUR BLOOD GLUCOSE SYSTEM) w/Device KIT Use to test blood sugar 3 times daily. Dx is E11.65 1 each 0  . cyclobenzaprine (FLEXERIL) 10 MG tablet Take 1 tablet (10 mg total) by mouth 3 (three) times daily as needed for muscle spasms. 90 tablet 0  . glucose blood test strip Contour Test Strips. Use as instructed to test blood sugar 3 times daily. 100 each 2  . Insulin Detemir (LEVEMIR FLEXPEN) 100 UNIT/ML Pen Inject 15 Units into the skin 2 (two) times daily. 15 mL 2  . Lancet Device MISC Use as instructed to test blood sugar 3 times daily. Dx is E11.65 100 each 2  . metoprolol tartrate (LOPRESSOR) 25 MG tablet Take 0.5 tablets (12.5 mg total) by mouth 2 (two) times daily. For blood pressure. 90 tablet 0  . albuterol (PROVENTIL HFA;VENTOLIN HFA) 108 (90 Base) MCG/ACT inhaler Inhale 2 puffs into the lungs every 6 (six) hours as needed for wheezing or shortness of breath. (Patient not taking: Reported on 08/28/2018) 1 Inhaler 0   No current facility-administered medications on file prior to visit.     BP (!) 146/92   Pulse (!) 131   Temp 98.2 F (36.8 C) (Oral)   Ht 5' 3"  (1.6 m)   Wt 134 lb 8 oz (61 kg)   SpO2 97%   BMI 23.83 kg/m     Objective:   Physical Exam  Constitutional: She is oriented to person, place, and time. She appears well-nourished.  HENT:  Nose:    Moderate swelling to anterior nose, some abrasions. Tender upon palpation, no bleeding or evidence of bleeding to nasal cavity.  Eyes: Pupils are equal, round, and reactive to light.  Neck: Neck supple.  Cardiovascular: Regular rhythm.  Sinus tachycardia  Respiratory: Effort normal and breath sounds normal.  Neurological: She is alert and oriented to person, place, and time. No cranial nerve deficit.  Skin: Skin is warm and dry.           Assessment & Plan:

## 2018-09-15 NOTE — Telephone Encounter (Signed)
Gave the approval for the verbal orders 

## 2018-09-18 ENCOUNTER — Telehealth: Payer: Self-pay | Admitting: *Deleted

## 2018-09-18 ENCOUNTER — Ambulatory Visit: Payer: Medicare HMO | Admitting: Primary Care

## 2018-09-18 NOTE — Telephone Encounter (Signed)
Approved.  

## 2018-09-18 NOTE — Telephone Encounter (Signed)
Spoke to [place] with [facility] who is requesting verbal orders to continue seeing pt 1x/wk for 1wk, 2x/wk for 2wks, then 1x/wk for 3wks with 2prn visits. pls advise

## 2018-09-18 NOTE — Telephone Encounter (Signed)
Verbal orders left on VM 

## 2018-09-24 ENCOUNTER — Encounter: Payer: Self-pay | Admitting: Primary Care

## 2018-09-24 ENCOUNTER — Ambulatory Visit (INDEPENDENT_AMBULATORY_CARE_PROVIDER_SITE_OTHER): Payer: Medicare Other | Admitting: Primary Care

## 2018-09-24 ENCOUNTER — Telehealth: Payer: Self-pay

## 2018-09-24 ENCOUNTER — Emergency Department
Admission: EM | Admit: 2018-09-24 | Discharge: 2018-09-24 | Discharge status: Against Medical Advice | Payer: Medicare Other

## 2018-09-24 ENCOUNTER — Telehealth: Payer: Self-pay | Admitting: Primary Care

## 2018-09-24 VITALS — BP 142/70 | HR 123 | Temp 98.4°F | Wt 125.0 lb

## 2018-09-24 DIAGNOSIS — R140 Abdominal distension (gaseous): Secondary | ICD-10-CM

## 2018-09-24 DIAGNOSIS — R1084 Generalized abdominal pain: Secondary | ICD-10-CM | POA: Diagnosis not present

## 2018-09-24 DIAGNOSIS — R197 Diarrhea, unspecified: Secondary | ICD-10-CM

## 2018-09-24 NOTE — ED Triage Notes (Signed)
FIRST NURSE NOTE-c/o abdominal pain and not being able to eat r/t pain. Alert and oriented. NAD.

## 2018-09-24 NOTE — Telephone Encounter (Signed)
I spoke with pt and she will have neighbor bring pt to office by 2 pm today. FYI to [name] NP

## 2018-09-24 NOTE — Patient Instructions (Signed)
Go to the hospital. Your abdominal distension and pain are very concerning.  It was a pleasure to see you today!

## 2018-09-24 NOTE — Telephone Encounter (Signed)
I spoke with pt and her BS was 98 on 09/23/18 at 12:13 PM; pt has not been eating and having watery diarrhea for 5 days.pt has lost 22 lbs in last 6 days. Pt said she cannot eat solid food but pt is able to drink and eat broth but pt said usually goes straight thru her with diarrhea. Pt had been taking levemir 15 U bid even though pt is not eating a lot. [name] nurse with [facility] came out today and advised pt to ck BS and if below 150 not to take levemir. Pt said she usually takes BS once daily. Pt just took BS and BS is 126 now; pt is going to eat broth now.pt having upper stomach pain (pain level now is 8) and upper stomach appears distended. Pt also having rt side pain across from [employer]. (pt does not have appendix). Pt said now when has watery diarrhea there is blood on tissue after wipes but no blood in water in commode.pt said has rectal burning when has diarrhea. Pt said mouth is not dry and she does not think she is dehydrated. No CP, H/A,dizziness or SOB.pt also has swelling in legs, feet and ankles for 7-8 days. When pt sleeps at night swelling does not go down.pt said it hurts when she walks in legs, no redness but swelling (top of socks cause indention)pt also has small spots of bruising on arms with no known injury; pt wants to know if could be related to Eliquis. Pt thinks [name] with [facility] is going to call [facility] but have not gotten call yet. Pt does not want to go to ED for eval at this time; pt said if her condition worsens or her pain gets worse she will go to ED.

## 2018-09-24 NOTE — Telephone Encounter (Signed)
Noted  

## 2018-09-24 NOTE — Telephone Encounter (Signed)
Please call patient and notify her that I am very disappointed that she left the hospital before evaluated by a doctor. Based off of her exam today she could have an abdominal infection, bowel obstruction, infectious diarrhea, kidney damage. Any of these things if not treated could cause severe problems and maybe death.   This all needs quick treatment and evaluation that we cannot provide in our office. Please tell her that in strongly advise she go back to the hospital for evaluation.

## 2018-09-24 NOTE — Progress Notes (Signed)
Subjective:    Patient ID: [name], female    DOB: Jun 09, 1956, 63 y.o.   MRN: [id number]  HPI  [name] is a 63 year old female with a complex medical history including uncontrolled diabetes, paroxysmal atrial fibrillation, recurrent falls, CAD, CVA, diverticulosis, cirrhosis, chronic pain, treatment non compliance who presents today with a chief complaint of diarrhea.  She phoned into our office earlier today with reports of a five day history of diarrhea, decrease in appetite, losing "22 pounds in 6 days", upper abdominal pain with distention. She was being evaluated by her home health at the time. She was brought in today for further evaluation.  Wt Readings from Last 3 Encounters:  09/24/18 125 lb (56.7 kg)  09/12/18 134 lb 8 oz (61 kg)  08/28/18 124 lb 4 oz (56.4 kg)   She is here today she endorses liquid diarrhea for the last 5 days. She will have diarrhea only with eating and drinking. She endorses extreme mid upper abdominal pain and some right lower abdominal pain with abdominal distension for the last five days. She's had little PO intake with solids and liquids. She's noticed one episode of bright red blood on the toilet paper, also dysuria.   She is checking her glucose levels without meals which is running 98, 126, 124, low 200's. She's been injecting 15 units of Levemir twice daily with her last injection being this morning at 11 am.    She denies headaches, sore throat, fevers, blood in the toilet bowel, nausea, vomiting.   Review of Systems  Constitutional: Positive for fatigue. Negative for fever.  Respiratory: Negative for cough and shortness of breath.   Cardiovascular: Negative for chest pain.  Gastrointestinal: Positive for abdominal distention, abdominal pain and diarrhea. Negative for nausea and vomiting.       Past Medical History:  Diagnosis Date  . Acute encephalopathy 05/22/2018  . Allergy   . Anxiety   . Ascites   . C. difficile colitis  07/10/2015  . Cancer (HCC)    HX OF CANCER OF UTERUS   . Cirrhosis of liver not due to alcohol ([place]) 2016  . Degenerative disk disease   . Diverticulitis   . Gastroparesis   . GERD (gastroesophageal reflux disease)   . History of hiatal hernia   . Hypertension   . Hypothyroid   . Hypothyroidism due to amiodarone   . Ileus ([place]) 08/01/2015  . Intussusception intestine ([place]) 05/2015  . Orthostatic hypotension   . PAF (paroxysmal atrial fibrillation) ([place]) 03/2015   a. new onset 03/2015 in setting of intractable N/V; b. on Eliquis 5 mg bid; c. CHADSVASc 4 (DM, TIA x 2, female)  . Pancreatitis   . Pneumonia 11/14/2015  . Right ureteral stone 07/14/2016  . Sick sinus syndrome ([place])   . Stomach ulcer   . Stroke [facility])    with minimal left sided weakness  . Syncope 01/2015  . Syncope due to orthostatic hypotension 05/18/2015  . Tachyarrhythmia 01/10/2016  . TIA (transient ischemic attack) 02/2015  . Type 1 diabetes ([place])    on levemir  . UTERINE CANCER, HX OF 03/27/2007   Qualifier: Diagnosis of  By: Maxie Better FNP, Rosalita Levan   . UTI (urinary tract infection) 05/22/2018     Social History   Socioeconomic History  . Marital status: Married    Spouse name: Not on file  . Number of children: Not on file  . Years of education: Not on file  . Highest  education level: Not on file  Occupational History  . Occupation: Disabled 2nd back problems  Social Needs  . Financial resource strain: Not on file  . Food insecurity:    Worry: Not on file    Inability: Not on file  . Transportation needs:    Medical: Not on file    Non-medical: Not on file  Tobacco Use  . Smoking status: Former Smoker    Types: Cigarettes  . Smokeless tobacco: Never Used  . Tobacco comment: 25 years ago and only smoked occasionally  Substance and Sexual Activity  . Alcohol use: No  . Drug use: No  . Sexual activity: Not Currently  Lifestyle  . Physical activity:    Days per week: Not on file    Minutes per  session: Not on file  . Stress: Not on file  Relationships  . Social connections:    Talks on phone: Not on file    Gets together: Not on file    Attends religious service: Not on file    Active member of club or organization: Not on file    Attends meetings of clubs or organizations: Not on file    Relationship status: Not on file  . Intimate partner violence:    Fear of current or ex partner: Not on file    Emotionally abused: Not on file    Physically abused: Not on file    Forced sexual activity: Not on file  Other Topics Concern  . Not on file  Social History Narrative   Lives in [place], [place] with her husband and 2 sons.    Past Surgical History:  Procedure Laterality Date  . ABDOMINAL HYSTERECTOMY    . CARDIAC CATHETERIZATION N/A 01/12/2016   Procedure: Left Heart Cath and Coronary Angiography;  Surgeon: Wellington Hampshire, MD;  Location: [place] CV LAB;  Service: Cardiovascular;  Laterality: N/A;  . CHOLECYSTECTOMY    . CYSTOSCOPY/URETEROSCOPY/HOLMIUM LASER Right 07/14/2016   Procedure: CYSTOSCOPY/URETEROSCOPY/HOLMIUM LASER;  Surgeon: Alexis Frock, MD;  Location: [facility] ORS;  Service: Urology;  Laterality: Right;  . ESOPHAGOGASTRODUODENOSCOPY N/A 04/04/2015   Procedure: ESOPHAGOGASTRODUODENOSCOPY (EGD);  Surgeon: Hulen Luster, MD;  [facility] ENDOSCOPY;  Service: Endoscopy;  Laterality: N/A;  . ESOPHAGOGASTRODUODENOSCOPY N/A 12/28/2017   Procedure: ESOPHAGOGASTRODUODENOSCOPY (EGD);  Surgeon: Lin Landsman, MD;  Location: [facility] ENDOSCOPY;  Service: Gastroenterology;  Laterality: N/A;  . ESOPHAGOGASTRODUODENOSCOPY (EGD) WITH PROPOFOL N/A 01/18/2016   Procedure: ESOPHAGOGASTRODUODENOSCOPY (EGD) WITH PROPOFOL;  Surgeon: Lucilla Lame, MD;  Location: [facility] ENDOSCOPY;  Service: Endoscopy;  Laterality: N/A;  . FLEXIBLE SIGMOIDOSCOPY N/A 01/18/2016   Procedure: FLEXIBLE SIGMOIDOSCOPY;  Surgeon: Lucilla Lame, MD;  Location: [facility] ENDOSCOPY;  Service: Endoscopy;  Laterality: N/A;  .  HERNIA REPAIR      Family History  Problem Relation Age of Onset  . Hypertension Mother   . CAD Sister   . Heart attack Sister        Deceased 2014/10/28  . CAD Brother     Allergies  Allergen Reactions  . Hydrocodone Other (See Comments)    Pt states that this medication caused cirrhosis of the liver.    . Aspirin   . Erythromycin Other (See Comments)    Reaction:  Fever   . Prednisone Other (See Comments)    Reaction:  Unknown   . Rosiglitazone Maleate Swelling  . Codeine Sulfate Rash  . Tetanus-Diphtheria Toxoids Td Rash and Other (See Comments)    Reaction:  Fever     Current  Outpatient Medications on File Prior to Visit  Medication Sig Dispense Refill  . albuterol (PROVENTIL HFA;VENTOLIN HFA) 108 (90 Base) MCG/ACT inhaler Inhale 2 puffs into the lungs every 6 (six) hours as needed for wheezing or shortness of breath. 1 Inhaler 0  . Blood Glucose Monitoring Suppl (CONTOUR BLOOD GLUCOSE SYSTEM) w/Device KIT Use to test blood sugar 3 times daily. Dx is E11.65 1 each 0  . cyclobenzaprine (FLEXERIL) 10 MG tablet Take 1 tablet (10 mg total) by mouth 3 (three) times daily as needed for muscle spasms. 90 tablet 0  . glucose blood test strip Contour Test Strips. Use as instructed to test blood sugar 3 times daily. 100 each 2  . Insulin Detemir (LEVEMIR FLEXPEN) 100 UNIT/ML Pen Inject 15 Units into the skin 2 (two) times daily. 15 mL 2  . Lancet Device MISC Use as instructed to test blood sugar 3 times daily. Dx is E11.65 100 each 2  . metoprolol tartrate (LOPRESSOR) 25 MG tablet Take 0.5 tablets (12.5 mg total) by mouth 2 (two) times daily. For blood pressure. 90 tablet 0  . apixaban (ELIQUIS) 5 MG TABS tablet Take 1 tablet (5 mg total) by mouth 2 (two) times daily. 180 tablet 0  . atorvastatin (LIPITOR) 40 MG tablet Take 1 tablet (40 mg total) by mouth daily. For cholesterol. 90 tablet 0   No current facility-administered medications on file prior to visit.     BP (!) 142/70 (BP  Location: Left Arm, Patient Position: Sitting, Cuff Size: Normal)   Pulse (!) 123   Temp 98.4 F (36.9 C) (Oral)   Wt 125 lb (56.7 kg)   SpO2 99%   BMI 22.14 kg/m    Objective:   Physical Exam  Constitutional: She is oriented to person, place, and time. She appears ill.  Neck: Neck supple.  Cardiovascular: Regular rhythm.  Respiratory: Effort normal and breath sounds normal.  GI: Bowel sounds are increased. There is abdominal tenderness in the right upper quadrant, right lower quadrant, left upper quadrant and left lower quadrant.  Moderate abdominal distension, firm abdomen. Patient appears very uncomfortable and is stopping in mid conversation due to pain.  Neurological: She is alert and oriented to person, place, and time.  Skin: Skin is warm and dry.           Assessment & Plan:  Diarrhea:  Also with generalized abdominal pain and distention. Exam today with moderate distension, patient appears uncomfortable/moderate distress.  Given persistent diarrhea with little PO intake, her abdominal distention, her abdominal pain, her complex medical history, will send her to [facility] ED. She has a driver and will go now.  [name], NP

## 2018-09-24 NOTE — ED Triage Notes (Signed)
Husband informed RN that pt does not want to wait. Strongly encouraged to stay. Pt was lead to believe that would go straight back since her provider sent her and was told she would call ahead.  Explained process and that labs and testing would start in triage and that based on symptoms pt should stay and be seen. Pt wishes to leave.  Encouraged to return if symptoms worsen.  Verbalized understanding.

## 2018-09-24 NOTE — Telephone Encounter (Signed)
Please get patient scheduled with me today around 2pm if possible.

## 2018-09-25 ENCOUNTER — Emergency Department: Payer: Medicare Other

## 2018-09-25 ENCOUNTER — Inpatient Hospital Stay: Payer: Medicare Other

## 2018-09-25 ENCOUNTER — Inpatient Hospital Stay
Admission: EM | Admit: 2018-09-25 | Discharge: 2018-09-26 | DRG: 433 | Disposition: A | Discharge status: Home Health | Payer: Medicare Other | Attending: Internal Medicine | Admitting: Internal Medicine

## 2018-09-25 ENCOUNTER — Other Ambulatory Visit: Payer: Self-pay

## 2018-09-25 DIAGNOSIS — R109 Unspecified abdominal pain: Secondary | ICD-10-CM | POA: Diagnosis not present

## 2018-09-25 DIAGNOSIS — R1084 Generalized abdominal pain: Secondary | ICD-10-CM | POA: Diagnosis not present

## 2018-09-25 DIAGNOSIS — R188 Other ascites: Secondary | ICD-10-CM

## 2018-09-25 DIAGNOSIS — Z886 Allergy status to analgesic agent status: Secondary | ICD-10-CM

## 2018-09-25 DIAGNOSIS — I480 Paroxysmal atrial fibrillation: Secondary | ICD-10-CM | POA: Diagnosis present

## 2018-09-25 DIAGNOSIS — K219 Gastro-esophageal reflux disease without esophagitis: Secondary | ICD-10-CM | POA: Diagnosis present

## 2018-09-25 DIAGNOSIS — Z885 Allergy status to narcotic agent status: Secondary | ICD-10-CM

## 2018-09-25 DIAGNOSIS — F419 Anxiety disorder, unspecified: Secondary | ICD-10-CM | POA: Diagnosis present

## 2018-09-25 DIAGNOSIS — E872 Acidosis, unspecified: Secondary | ICD-10-CM

## 2018-09-25 DIAGNOSIS — I420 Dilated cardiomyopathy: Secondary | ICD-10-CM | POA: Diagnosis present

## 2018-09-25 DIAGNOSIS — Z8542 Personal history of malignant neoplasm of other parts of uterus: Secondary | ICD-10-CM | POA: Diagnosis not present

## 2018-09-25 DIAGNOSIS — Z8711 Personal history of peptic ulcer disease: Secondary | ICD-10-CM

## 2018-09-25 DIAGNOSIS — T462X1A Poisoning by other antidysrhythmic drugs, accidental (unintentional), initial encounter: Secondary | ICD-10-CM | POA: Diagnosis present

## 2018-09-25 DIAGNOSIS — Z87891 Personal history of nicotine dependence: Secondary | ICD-10-CM

## 2018-09-25 DIAGNOSIS — Z888 Allergy status to other drugs, medicaments and biological substances status: Secondary | ICD-10-CM

## 2018-09-25 DIAGNOSIS — I252 Old myocardial infarction: Secondary | ICD-10-CM

## 2018-09-25 DIAGNOSIS — K529 Noninfective gastroenteritis and colitis, unspecified: Secondary | ICD-10-CM | POA: Diagnosis not present

## 2018-09-25 DIAGNOSIS — J302 Other seasonal allergic rhinitis: Secondary | ICD-10-CM | POA: Diagnosis present

## 2018-09-25 DIAGNOSIS — E876 Hypokalemia: Secondary | ICD-10-CM | POA: Diagnosis present

## 2018-09-25 DIAGNOSIS — E1065 Type 1 diabetes mellitus with hyperglycemia: Secondary | ICD-10-CM | POA: Diagnosis present

## 2018-09-25 DIAGNOSIS — I10 Essential (primary) hypertension: Secondary | ICD-10-CM | POA: Diagnosis present

## 2018-09-25 DIAGNOSIS — E032 Hypothyroidism due to medicaments and other exogenous substances: Secondary | ICD-10-CM | POA: Diagnosis present

## 2018-09-25 DIAGNOSIS — K7460 Unspecified cirrhosis of liver: Secondary | ICD-10-CM | POA: Diagnosis not present

## 2018-09-25 DIAGNOSIS — K7031 Alcoholic cirrhosis of liver with ascites: Principal | ICD-10-CM | POA: Diagnosis present

## 2018-09-25 DIAGNOSIS — Z9181 History of falling: Secondary | ICD-10-CM

## 2018-09-25 DIAGNOSIS — Z90710 Acquired absence of both cervix and uterus: Secondary | ICD-10-CM

## 2018-09-25 DIAGNOSIS — R1010 Upper abdominal pain, unspecified: Secondary | ICD-10-CM | POA: Diagnosis not present

## 2018-09-25 DIAGNOSIS — E860 Dehydration: Secondary | ICD-10-CM | POA: Diagnosis present

## 2018-09-25 DIAGNOSIS — Z7901 Long term (current) use of anticoagulants: Secondary | ICD-10-CM

## 2018-09-25 DIAGNOSIS — K279 Peptic ulcer, site unspecified, unspecified as acute or chronic, without hemorrhage or perforation: Secondary | ICD-10-CM | POA: Diagnosis not present

## 2018-09-25 DIAGNOSIS — Z9049 Acquired absence of other specified parts of digestive tract: Secondary | ICD-10-CM | POA: Diagnosis not present

## 2018-09-25 DIAGNOSIS — I495 Sick sinus syndrome: Secondary | ICD-10-CM | POA: Diagnosis present

## 2018-09-25 DIAGNOSIS — Z79899 Other long term (current) drug therapy: Secondary | ICD-10-CM | POA: Diagnosis not present

## 2018-09-25 DIAGNOSIS — Z8249 Family history of ischemic heart disease and other diseases of the circulatory system: Secondary | ICD-10-CM

## 2018-09-25 DIAGNOSIS — N2889 Other specified disorders of kidney and ureter: Secondary | ICD-10-CM | POA: Diagnosis present

## 2018-09-25 DIAGNOSIS — Z881 Allergy status to other antibiotic agents status: Secondary | ICD-10-CM

## 2018-09-25 DIAGNOSIS — Z794 Long term (current) use of insulin: Secondary | ICD-10-CM

## 2018-09-25 DIAGNOSIS — G8929 Other chronic pain: Secondary | ICD-10-CM | POA: Diagnosis present

## 2018-09-25 DIAGNOSIS — R52 Pain, unspecified: Secondary | ICD-10-CM

## 2018-09-25 DIAGNOSIS — E119 Type 2 diabetes mellitus without complications: Secondary | ICD-10-CM | POA: Diagnosis not present

## 2018-09-25 DIAGNOSIS — Z960 Presence of urogenital implants: Secondary | ICD-10-CM | POA: Diagnosis present

## 2018-09-25 DIAGNOSIS — R0902 Hypoxemia: Secondary | ICD-10-CM | POA: Diagnosis not present

## 2018-09-25 LAB — COMPREHENSIVE METABOLIC PANEL
ALT: 18 U/L (ref 0–44)
AST: 34 U/L (ref 15–41)
Albumin: 2.9 g/dL — ABNORMAL LOW (ref 3.5–5.0)
Alkaline Phosphatase: 115 U/L (ref 38–126)
Anion gap: 9 (ref 5–15)
BUN: 16 mg/dL (ref 8–23)
CHLORIDE: 107 mmol/L (ref 98–111)
CO2: 21 mmol/L — ABNORMAL LOW (ref 22–32)
Calcium: 8.4 mg/dL — ABNORMAL LOW (ref 8.9–10.3)
Creatinine, Ser: 0.79 mg/dL (ref 0.44–1.00)
GFR calc Af Amer: >60 mL/min (ref >60)
GFR calc non Af Amer: >60 mL/min (ref >60)
Glucose, Bld: 95 mg/dL (ref 70–99)
POTASSIUM: 2.8 mmol/L — AB (ref 3.5–5.1)
Sodium: 137 mmol/L (ref 135–145)
Total Bilirubin: 1 mg/dL (ref 0.3–1.2)
Total Protein: 7.2 g/dL (ref 6.5–8.1)

## 2018-09-25 LAB — URINALYSIS, COMPLETE (UACMP) WITH MICROSCOPIC
Bacteria, UA: NONE SEEN
Bilirubin Urine: NEGATIVE
Glucose, UA: NEGATIVE mg/dL
Ketones, ur: NEGATIVE mg/dL
Nitrite: NEGATIVE
PH: 6 (ref 5.0–8.0)
Protein, ur: NEGATIVE mg/dL
Specific Gravity, Urine: 1.027 (ref 1.005–1.030)
WBC, UA: >50 WBC/hpf — ABNORMAL HIGH (ref 0–5)

## 2018-09-25 LAB — PROCALCITONIN: Procalcitonin: <0.1 ng/mL

## 2018-09-25 LAB — GLUCOSE, CAPILLARY
Glucose-Capillary: 32 mg/dL — CL (ref 70–99)
Glucose-Capillary: 43 mg/dL — CL (ref 70–99)
Glucose-Capillary: 52 mg/dL — ABNORMAL LOW (ref 70–99)
Glucose-Capillary: 91 mg/dL (ref 70–99)
Glucose-Capillary: 156 mg/dL — ABNORMAL HIGH (ref 70–99)
Glucose-Capillary: 55 mg/dL — ABNORMAL LOW (ref 70–99)
Glucose-Capillary: 87 mg/dL (ref 70–99)

## 2018-09-25 LAB — POCT I-STAT, CHEM 8
BUN: 16 mg/dL (ref 8–23)
Calcium, Ion: 1.18 mmol/L (ref 1.15–1.40)
Chloride: 106 mmol/L (ref 98–111)
Creatinine, Ser: 0.7 mg/dL (ref 0.44–1.00)
Glucose, Bld: 91 mg/dL (ref 70–99)
HCT: 34 % — ABNORMAL LOW (ref 36.0–46.0)
Hemoglobin: 11.6 g/dL — ABNORMAL LOW (ref 12.0–15.0)
POTASSIUM: 3 mmol/L — AB (ref 3.5–5.1)
Sodium: 139 mmol/L (ref 135–145)
TCO2: 23 mmol/L (ref 22–32)

## 2018-09-25 LAB — CBC WITH DIFFERENTIAL/PLATELET
Abs Immature Granulocytes: 0.03 10*3/uL (ref 0.00–0.07)
Basophils Absolute: 0 10*3/uL (ref 0.0–0.1)
Basophils Relative: 0 %
Eosinophils Absolute: 0.1 10*3/uL (ref 0.0–0.5)
Eosinophils Relative: 1 %
HCT: 34.1 % — ABNORMAL LOW (ref 36.0–46.0)
Hemoglobin: 10.5 g/dL — ABNORMAL LOW (ref 12.0–15.0)
Immature Granulocytes: 0 %
Lymphocytes Relative: 11 %
Lymphs Abs: 0.8 10*3/uL (ref 0.7–4.0)
MCH: 27.8 pg (ref 26.0–34.0)
MCHC: 30.8 g/dL (ref 30.0–36.0)
MCV: 90.2 fL (ref 80.0–100.0)
Monocytes Absolute: 0.4 10*3/uL (ref 0.1–1.0)
Monocytes Relative: 6 %
Neutro Abs: 5.7 10*3/uL (ref 1.7–7.7)
Neutrophils Relative %: 82 %
Platelets: 189 10*3/uL (ref 150–400)
RBC: 3.78 MIL/uL — ABNORMAL LOW (ref 3.87–5.11)
RDW: 18.3 % — AB (ref 11.5–15.5)
WBC: 7 10*3/uL (ref 4.0–10.5)
nRBC: 0 % (ref 0.0–0.2)

## 2018-09-25 LAB — BODY FLUID CELL COUNT WITH DIFFERENTIAL
Eos, Fluid: 0 %
LYMPHS FL: 20 %
Monocyte-Macrophage-Serous Fluid: 62 %
Neutrophil Count, Fluid: 18 %
Total Nucleated Cell Count, Fluid: 139 cu mm

## 2018-09-25 LAB — TSH: TSH: 5.301 u[IU]/mL — ABNORMAL HIGH (ref 0.350–4.500)

## 2018-09-25 LAB — LACTIC ACID, PLASMA
LACTIC ACID, VENOUS: 2 mmol/L — AB (ref 0.5–1.9)
Lactic Acid, Venous: 1 mmol/L (ref 0.5–1.9)

## 2018-09-25 LAB — MAGNESIUM: Magnesium: 1.9 mg/dL (ref 1.7–2.4)

## 2018-09-25 LAB — PROTIME-INR
INR: 1.15
Prothrombin Time: 14.6 seconds (ref 11.4–15.2)

## 2018-09-25 LAB — LIPASE, BLOOD: Lipase: 15 U/L (ref 11–51)

## 2018-09-25 LAB — T4, FREE: Free T4: 0.98 ng/dL (ref 0.82–1.77)

## 2018-09-25 LAB — AMMONIA: Ammonia: 28 umol/L (ref 9–35)

## 2018-09-25 LAB — BETA-HYDROXYBUTYRIC ACID: Beta-Hydroxybutyric Acid: 0.08 mmol/L (ref 0.05–0.27)

## 2018-09-25 MED ORDER — FENTANYL CITRATE (PF) 100 MCG/2ML IJ SOLN
50.0000 ug | Freq: Once | INTRAMUSCULAR | Status: AC
  Administered 2018-09-25: 50 ug via INTRAVENOUS
  Filled 2018-09-25: qty 2

## 2018-09-25 MED ORDER — ENOXAPARIN SODIUM 40 MG/0.4ML ~~LOC~~ SOLN
40.0000 mg | SUBCUTANEOUS | Status: DC
  Administered 2018-09-25: 40 mg via SUBCUTANEOUS
  Filled 2018-09-25: qty 0.4

## 2018-09-25 MED ORDER — ATORVASTATIN CALCIUM 20 MG PO TABS
40.0000 mg | ORAL_TABLET | Freq: Every day | ORAL | Status: DC
  Administered 2018-09-25 – 2018-09-26 (×2): 40 mg via ORAL
  Filled 2018-09-25 (×2): qty 2

## 2018-09-25 MED ORDER — LIDOCAINE HCL (PF) 1 % IJ SOLN
INTRAMUSCULAR | Status: AC
  Filled 2018-09-25: qty 5

## 2018-09-25 MED ORDER — PANTOPRAZOLE SODIUM 40 MG IV SOLR
40.0000 mg | INTRAVENOUS | Status: DC
  Administered 2018-09-25 – 2018-09-26 (×2): 40 mg via INTRAVENOUS
  Filled 2018-09-25 (×2): qty 40

## 2018-09-25 MED ORDER — SODIUM CHLORIDE 0.9 % IV SOLN
INTRAVENOUS | Status: DC | PRN
  Administered 2018-09-25 – 2018-09-26 (×3): 250 mL via INTRAVENOUS

## 2018-09-25 MED ORDER — POTASSIUM CHLORIDE 10 MEQ/100ML IV SOLN: 10.0000 meq | Freq: Once | INTRAVENOUS | Status: DC

## 2018-09-25 MED ORDER — INSULIN ASPART 100 UNIT/ML ~~LOC~~ SOLN
0.0000 [IU] | Freq: Three times a day (TID) | SUBCUTANEOUS | Status: DC
  Administered 2018-09-25 – 2018-09-26 (×2): 3 [IU] via SUBCUTANEOUS
  Filled 2018-09-25 (×2): qty 1

## 2018-09-25 MED ORDER — HALOPERIDOL LACTATE 5 MG/ML IJ SOLN
2.5000 mg | Freq: Once | INTRAMUSCULAR | Status: AC
  Administered 2018-09-25: 2.5 mg via INTRAVENOUS
  Filled 2018-09-25: qty 1

## 2018-09-25 MED ORDER — ALBUTEROL SULFATE HFA 108 (90 BASE) MCG/ACT IN AERS: 2.0000 | INHALATION_SPRAY | Freq: Four times a day (QID) | RESPIRATORY_TRACT | Status: DC | PRN

## 2018-09-25 MED ORDER — FLUCONAZOLE 100 MG PO TABS
100.0000 mg | ORAL_TABLET | Freq: Every day | ORAL | Status: DC
  Administered 2018-09-25 – 2018-09-26 (×2): 100 mg via ORAL
  Filled 2018-09-25 (×2): qty 1

## 2018-09-25 MED ORDER — LIDOCAINE HCL (PF) 1 % IJ SOLN
5.0000 mL | Freq: Once | INTRAMUSCULAR | Status: AC
  Administered 2018-09-25: 5 mL via INTRADERMAL

## 2018-09-25 MED ORDER — METOPROLOL TARTRATE 25 MG PO TABS
12.5000 mg | ORAL_TABLET | Freq: Two times a day (BID) | ORAL | Status: DC
  Administered 2018-09-25 – 2018-09-26 (×3): 12.5 mg via ORAL
  Filled 2018-09-25 (×3): qty 1

## 2018-09-25 MED ORDER — IOHEXOL 300 MG/ML  SOLN
100.0000 mL | Freq: Once | INTRAMUSCULAR | Status: AC | PRN
  Administered 2018-09-25: 100 mL via INTRAVENOUS

## 2018-09-25 MED ORDER — DICYCLOMINE HCL 10 MG/5ML PO SOLN
10.0000 mg | Freq: Once | ORAL | Status: AC
  Administered 2018-09-25: 11:00:00 10 mg via ORAL
  Filled 2018-09-25: qty 5

## 2018-09-25 MED ORDER — POTASSIUM CHLORIDE 10 MEQ/100ML IV SOLN
10.0000 meq | INTRAVENOUS | Status: AC
  Administered 2018-09-25 (×4): 10 meq via INTRAVENOUS
  Filled 2018-09-25 (×4): qty 100

## 2018-09-25 MED ORDER — CYCLOBENZAPRINE HCL 10 MG PO TABS: 5.0000 mg | ORAL_TABLET | Freq: Three times a day (TID) | ORAL | Status: DC | PRN

## 2018-09-25 MED ORDER — MAGNESIUM SULFATE 2 GM/50ML IV SOLN
2.0000 g | Freq: Once | INTRAVENOUS | Status: AC
  Administered 2018-09-25: 2 g via INTRAVENOUS
  Filled 2018-09-25: qty 50

## 2018-09-25 MED ORDER — METRONIDAZOLE IN NACL 5-0.79 MG/ML-% IV SOLN
500.0000 mg | Freq: Once | INTRAVENOUS | Status: AC
  Administered 2018-09-25: 500 mg via INTRAVENOUS
  Filled 2018-09-25: qty 100

## 2018-09-25 MED ORDER — ONDANSETRON HCL 4 MG PO TABS: 4.0000 mg | ORAL_TABLET | Freq: Four times a day (QID) | ORAL | Status: DC | PRN

## 2018-09-25 MED ORDER — SODIUM CHLORIDE 0.9 % IV SOLN
1.0000 g | INTRAVENOUS | Status: DC
  Administered 2018-09-26: 1 g via INTRAVENOUS
  Filled 2018-09-25: qty 10
  Filled 2018-09-25: qty 1

## 2018-09-25 MED ORDER — INSULIN ASPART 100 UNIT/ML ~~LOC~~ SOLN: 0.0000 [IU] | Freq: Every day | SUBCUTANEOUS | Status: DC

## 2018-09-25 MED ORDER — ONDANSETRON HCL 4 MG/2ML IJ SOLN
4.0000 mg | Freq: Four times a day (QID) | INTRAMUSCULAR | Status: DC | PRN
  Filled 2018-09-25: qty 2

## 2018-09-25 MED ORDER — HYOSCYAMINE SULFATE 0.125 MG SL SUBL
0.2500 mg | SUBLINGUAL_TABLET | Freq: Once | SUBLINGUAL | Status: AC
  Administered 2018-09-25: 0.25 mg via SUBLINGUAL
  Filled 2018-09-25: qty 2

## 2018-09-25 MED ORDER — OXYCODONE HCL 5 MG PO TABS
5.0000 mg | ORAL_TABLET | Freq: Three times a day (TID) | ORAL | Status: DC | PRN
  Administered 2018-09-25 – 2018-09-26 (×3): 5 mg via ORAL
  Filled 2018-09-25 (×3): qty 1

## 2018-09-25 MED ORDER — SODIUM CHLORIDE 0.9 % IV BOLUS
1000.0000 mL | Freq: Once | INTRAVENOUS | Status: AC
  Administered 2018-09-25: 1000 mL via INTRAVENOUS

## 2018-09-25 MED ORDER — ALUM & MAG HYDROXIDE-SIMETH 200-200-20 MG/5ML PO SUSP
30.0000 mL | Freq: Once | ORAL | Status: AC
  Administered 2018-09-25: 10:00:00 30 mL via ORAL
  Filled 2018-09-25: qty 30

## 2018-09-25 MED ORDER — POTASSIUM CHLORIDE 20 MEQ PO PACK
40.0000 meq | PACK | Freq: Once | ORAL | Status: AC
  Administered 2018-09-25: 10:00:00 40 meq via ORAL
  Filled 2018-09-25: qty 2

## 2018-09-25 MED ORDER — SODIUM CHLORIDE 0.9 % IV SOLN
1.0000 g | Freq: Once | INTRAVENOUS | Status: AC
  Administered 2018-09-25: 1 g via INTRAVENOUS
  Filled 2018-09-25: qty 10

## 2018-09-25 MED ORDER — INSULIN DETEMIR 100 UNIT/ML ~~LOC~~ SOLN
8.0000 [IU] | Freq: Two times a day (BID) | SUBCUTANEOUS | Status: DC
  Administered 2018-09-25: 8 [IU] via SUBCUTANEOUS
  Filled 2018-09-25 (×2): qty 0.08

## 2018-09-25 MED ORDER — METRONIDAZOLE IN NACL 5-0.79 MG/ML-% IV SOLN
500.0000 mg | Freq: Three times a day (TID) | INTRAVENOUS | Status: DC
  Administered 2018-09-25 – 2018-09-26 (×3): 500 mg via INTRAVENOUS
  Filled 2018-09-25 (×7): qty 100

## 2018-09-25 MED ORDER — ALBUTEROL SULFATE (2.5 MG/3ML) 0.083% IN NEBU: 2.5000 mg | INHALATION_SOLUTION | Freq: Four times a day (QID) | RESPIRATORY_TRACT | Status: DC | PRN

## 2018-09-25 NOTE — ED Notes (Signed)
ED Provider at bedside. 

## 2018-09-25 NOTE — ED Provider Notes (Signed)
[facility] Emergency Department Provider Note  ____________________________________________   First MD Initiated Contact with Patient 09/25/18 0400     (approximate)  I have reviewed the triage vital signs and the nursing notes.   HISTORY  Chief Complaint Abdominal Pain   HPI [name] is a 63 y.o. female who comes to the emergency department via EMS with roughly 1 week of abdominal pain nausea and diarrhea.  The patient is a challenging historian but does seem to report that her symptoms have been ongoing for about 1 week.  They were gradual onset slowly progressive and are now severe.  They are diffuse throughout her abdomen described as cramping.  Do not seem to radiate.  She has a loose stools whenever she eats any food so today she did not eat anything.  No recent antibiotics.  No fevers or chills.  She has had multiple abdominal surgeries including an abdominal hysterectomy and a cholecystectomy.  She saw her primary care nurse practitioner yesterday who advised her to come to the emergency department however when the patient arrived  in our emergency department she did not want to wait so she went home.  This morning her pain became severe so she called 911.  She has taken no medications.   Past Medical History:  Diagnosis Date  . Acute encephalopathy 05/22/2018  . Allergy   . Anxiety   . Ascites   . C. difficile colitis 07/10/2015  . Cancer (HCC)    HX OF CANCER OF UTERUS   . Cirrhosis of liver not due to alcohol ([place]) 2016  . Degenerative disk disease   . Diverticulitis   . Gastroparesis   . GERD (gastroesophageal reflux disease)   . History of hiatal hernia   . Hypertension   . Hypothyroid   . Hypothyroidism due to amiodarone   . Ileus ([place]) 08/01/2015  . Intussusception intestine ([place]) 05/2015  . Orthostatic hypotension   . PAF (paroxysmal atrial fibrillation) ([place]) 03/2015   a. new onset 03/2015 in setting of intractable N/V; b. on  Eliquis 5 mg bid; c. CHADSVASc 4 (DM, TIA x 2, female)  . Pancreatitis   . Pneumonia 11/14/2015  . Right ureteral stone 07/14/2016  . Sick sinus syndrome ([place])   . Stomach ulcer   . Stroke  [facility])    with minimal left sided weakness  . Syncope 01/2015  . Syncope due to orthostatic hypotension 05/18/2015  . Tachyarrhythmia 01/10/2016  . TIA (transient ischemic attack) 02/2015  . Type 1 diabetes ([place])    on levemir  . UTERINE CANCER, HX OF 03/27/2007   Qualifier: Diagnosis of  By: Maxie Better FNP, Rosalita Levan   . UTI (urinary tract infection) 05/22/2018    Patient Active Problem List   Diagnosis Date Noted  . Recurrent falls 09/12/2018  . Orthostatic hypotension 08/18/2018  . Normochromic normocytic anemia 08/18/2018  . ARF (acute renal failure) ([place]) 08/18/2018  . Chronic neck and back pain 08/14/2018  . History of hypothyroidism 08/14/2018  . Medical non-compliance 08/14/2018  . Diabetic ketoacidosis ([place]) 05/22/2018  . Sepsis ([place]) 12/15/2017  . Personal history of surgery to heart and great vessels, presenting hazards to health   . Congestive dilated cardiomyopathy ([place])   . NSTEMI (non-ST elevated myocardial infarction) ([place]) 01/11/2016  . Tachy-brady syndrome ([place]) 12/28/2015  . Narcotic withdrawal ([place]) 11/11/2015  . H/O TIA (transient ischemic attack) and stroke   . Cryptogenic cirrhosis ([place]) 07/10/2015  . Paroxysmal atrial fibrillation ([place]) 07/10/2015  .  Malnutrition of moderate degree 07/04/2015  . Symptomatic bradycardia 05/27/2015  . Chronic anemia 05/19/2015  . Thrombocytopenia ([place]) 05/19/2015  . Prolonged QT interval   . Hypomagnesemia   . Narcotic abuse ([place])   . Uncontrolled type 2 diabetes mellitus ([place])   . Hypokalemia 04/06/2015  . Hyperlipidemia with target LDL less than 100 04/06/2015  . CVA (cerebral vascular accident) ([place]) 02/15/2015  . Essential hypertension 01/12/2015  . Chronically on opiate therapy 01/12/2015  . DEPRESSION/ANXIETY 06/27/2007  .  MYOFASCIAL PAIN SYNDROME 06/27/2007  . Chronic pain syndrome 03/28/2007  . GERD 03/27/2007  . DIVERTICULOSIS, COLON 03/27/2007  . LUMBAR DISC DISPLACEMENT 03/27/2007  . PROTEINURIA 03/27/2007    Past Surgical History:  Procedure Laterality Date  . ABDOMINAL HYSTERECTOMY    . CARDIAC CATHETERIZATION N/A 01/12/2016   Procedure: Left Heart Cath and Coronary Angiography;  Surgeon: Wellington Hampshire, MD;  Location: [facility];  Service: Cardiovascular;  Laterality: N/A;  . CHOLECYSTECTOMY    . CYSTOSCOPY/URETEROSCOPY/HOLMIUM LASER Right 07/14/2016   Procedure: CYSTOSCOPY/URETEROSCOPY/HOLMIUM LASER;  Surgeon: Alexis Frock, MD;  Location: [facility] ORS;  Service: Urology;  Laterality: Right;  . ESOPHAGOGASTRODUODENOSCOPY N/A 04/04/2015   Procedure: ESOPHAGOGASTRODUODENOSCOPY (EGD);  Surgeon: Hulen Luster, MD;  Location[facility] END[facility]COPY;  Service: Endoscopy;  Laterality: N/A;  . ESOPHAGOGASTRODUODENOSCOPY N/A 12/28/2017   Procedure: ESOPHAGOGASTRODUODENOSCOPY (EGD);  Surgeon: Lin Landsman, MD;  Location: [facility] ENDOSCOPY;  Service: Gastroenterology;  Laterality: N/A;  . ESOPHAGOGASTRODUODENOSCOPY (EGD) WITH PROPOFOL N/A 01/18/2016   Procedure: ESOPHAGOGASTRODUODENOSCOPY (EGD) WITH PROPOFOL;  Surgeon: Lucilla Lame, MD;  Location: [facility] ENDOSCOPY;  Service: Endoscopy;  Laterality: N/A;  . FLEXIBLE SIGMOIDOSCOPY N/A 01/18/2016   Procedure: FLEXIBLE SIGMOIDOSCOPY;  Surgeon: Lucilla Lame, MD;  Location: [facility] ENDOSCOPY;  Service: Endoscopy;  Laterality: N/A;  . HERNIA REPAIR      Prior to Admission medications   Medication Sig Start Date End Date Taking? Authorizing Provider  albuterol (PROVENTIL HFA;VENTOLIN HFA) 108 (90 Base) MCG/ACT inhaler Inhale 2 puffs into the lungs every 6 (six) hours as needed for wheezing or shortness of breath. 12/17/17   Henreitta Leber, MD  apixaban (ELIQUIS) 5 MG TABS tablet Take 1 tablet (5 mg total) by mouth 2 (two) times daily. 08/14/18 09/13/18  [name], NP    apixaban (ELIQUIS) 5 MG TABS tablet Take 5 mg by mouth 2 (two) times daily.    [provider]  atorvastatin (LIPITOR) 40 MG tablet Take 1 tablet (40 mg total) by mouth daily. For cholesterol. 08/14/18 09/13/18  [name], NP  atorvastatin (LIPITOR) 40 MG tablet Take 40 mg by mouth daily.    [provider]  Blood Glucose Monitoring Suppl (CONTOUR BLOOD GLUCOSE SYSTEM) w/Device KIT Use to test blood sugar 3 times daily. Dx is E11.65 08/28/18   [name], NP  cyclobenzaprine (FLEXERIL) 10 MG tablet Take 1 tablet (10 mg total) by mouth 3 (three) times daily as needed for muscle spasms. 08/28/18   [name], NP  glucose blood test strip Contour Test Strips. Use as instructed to test blood sugar 3 times daily. 08/28/18   [name], NP  Insulin Detemir (LEVEMIR FLEXPEN) 100 UNIT/ML Pen Inject 15 Units into the skin 2 (two) times daily. 08/14/18   [name], NP  Lancet Device MISC Use as instructed to test blood sugar 3 times daily. Dx is E11.65 08/28/18   [name], NP  metoprolol tartrate (LOPRESSOR) 25 MG tablet Take 0.5 tablets (12.5 mg total)  by mouth 2 (two) times daily. For blood pressure. 08/28/18 09/27/18  [name], NP    Allergies Hydrocodone; Aspirin; Erythromycin; Prednisone; Rosiglitazone maleate; Codeine sulfate; and Tetanus-diphtheria toxoids td  Family History  Problem Relation Age of Onset  . Hypertension Mother   . CAD Sister   . Heart attack Sister        Deceased 11-23-2014  . CAD Brother     Social History Social History   Tobacco Use  . Smoking status: Former Smoker    Types: Cigarettes  . Smokeless tobacco: Never Used  . Tobacco comment: 25 years ago and only smoked occasionally  Substance Use Topics  . Alcohol use: No  . Drug use: No    Review of Systems Constitutional: No fever/chills Eyes: No visual changes. ENT: No sore throat. Cardiovascular: Denies chest pain. Respiratory: Denies  shortness of breath. Gastrointestinal: Positive for abdominal pain.  Positive for nausea, positive for vomiting.  Positive for diarrhea.  No constipation. Genitourinary: Negative for dysuria. Musculoskeletal: Negative for back pain. Skin: Negative for rash. Neurological: Negative for headaches, focal weakness or numbness.   ____________________________________________   PHYSICAL EXAM:  VITAL SIGNS: ED Triage Vitals  Enc Vitals Group     BP      Pulse      Resp      Temp      Temp src      SpO2      Weight      Height      Head Circumference      Peak Flow      Pain Score      Pain Loc      Pain Edu?      Excl. in [place]?     Constitutional: Alert and oriented x4 appears obviously uncomfortable grimacing and holding her abdomen and tearful Eyes: PERRL EOMI. Head: Atraumatic. Nose: No congestion/rhinnorhea. Mouth/Throat: No trismus Neck: No stridor.   Cardiovascular: Irregularly irregular and tachycardic.  Normal heart sounds Respiratory: Normal respiratory effort.  No retractions. Lungs CTAB and moving good air Gastrointestinal: Distended and tympanitic abdomen.  Diffusely quite tender with no frank peritonitis Musculoskeletal: No lower extremity edema   Neurologic:  Normal speech and language. No gross focal neurologic deficits are appreciated. Skin:  Skin is warm, dry and intact. No rash noted. Psychiatric: Mood and affect are normal. Speech and behavior are normal.    ____________________________________________   DIFFERENTIAL includes but not limited to  Volvulus, small bowel obstruction, large bowel obstruction, appendicitis, diverticulitis, spontaneous bacterial peritonitis ____________________________________________   LABS (all labs ordered are listed, but only abnormal results are displayed)  Labs Reviewed  COMPREHENSIVE METABOLIC PANEL - Abnormal; Notable for the following components:      Result Value   Potassium 2.8 (*)    CO2 21 (*)    Calcium  8.4 (*)    Albumin 2.9 (*)    All other components within normal limits  LACTIC ACID, PLASMA - Abnormal; Notable for the following components:   Lactic Acid, Venous 2.0 (*)    All other components within normal limits  CBC WITH DIFFERENTIAL/PLATELET - Abnormal; Notable for the following components:   RBC 3.78 (*)    Hemoglobin 10.5 (*)    HCT 34.1 (*)    RDW 18.3 (*)    All other components within normal limits  TSH - Abnormal; Notable for the following components:   TSH 5.301 (*)    All other components within normal limits  POCT I-STAT,  CHEM 8 - Abnormal; Notable for the following components:   Potassium 3.0 (*)    Hemoglobin 11.6 (*)    HCT 34.0 (*)    All other components within normal limits  GASTROINTESTINAL PANEL BY PCR, STOOL (REPLACES STOOL CULTURE)  C DIFFICILE QUICK SCREEN W PCR REFLEX  CULTURE, BLOOD (ROUTINE X 2)  CULTURE, BLOOD (ROUTINE X 2)  BODY FLUID CULTURE  LIPASE, BLOOD  AMMONIA  PROTIME-INR  T4, FREE  BETA-HYDROXYBUTYRIC ACID  PROCALCITONIN  LACTIC ACID, PLASMA  URINALYSIS, COMPLETE (UACMP) WITH MICROSCOPIC  BODY FLUID CELL COUNT WITH DIFFERENTIAL  I-STAT CHEM 8, ED    Lab work reviewed by me shows hypokalemia and lactic acidosis.  Hemoglobin is roughly at her baseline __________________________________________  EKG  ED ECG REPORT I, Darel Hong, the attending physician, personally viewed and interpreted this ECG.  Date: 09/25/2018 EKG Time:  Rate: 96 Rhythm: Atrial fibrillation rate controlled QRS Axis: normal Intervals: normal ST/T Wave abnormalities: normal Narrative Interpretation: no evidence of acute ischemia.  Poor R wave progression.  Nonspecific intraventricular conduction delay  ____________________________________________  RADIOLOGY  KUB reviewed by me with nonspecific bowel pattern and no acute disease noted CT abdomen pelvis reviewed by me shows a necrotic appearing mass in the right peri-colic gutter extending into the  psoas muscle concerning for abscess versus malignancy ____________________________________________   PROCEDURES  Procedure(s) performed: yes  Angiocath insertion Performed by: Darel Hong  Consent: Verbal consent obtained. Risks and benefits: risks, benefits and alternatives were discussed Time out: Immediately prior to procedure a "time out" was called to verify the correct patient, procedure, equipment, support staff and site/side marked as required.  Preparation: Patient was prepped and draped in the usual sterile fashion.  Vein Location: left upper extremity  Ultrasound Guided  Gauge: 18  Normal blood return and flush without difficulty Patient tolerance: Patient tolerated the procedure well with no immediate complications.     .Critical Care Performed by: Darel Hong, MD Authorized by: Darel Hong, MD   Critical care provider statement:    Critical care time (minutes):  30   Critical care time was exclusive of:  Separately billable procedures and treating other patients   Critical care was necessary to treat or prevent imminent or life-threatening deterioration of the following conditions:  Dehydration   Critical care was time spent personally by me on the following activities:  Development of treatment plan with patient or surrogate, discussions with consultants, evaluation of patient's response to treatment, examination of patient, obtaining history from patient or surrogate, ordering and performing treatments and interventions, ordering and review of laboratory studies, ordering and review of radiographic studies, pulse oximetry, re-evaluation of patient's condition and review of old charts  .Paracentesis Date/Time: 09/25/2018 5:43 AM Performed by: Darel Hong, MD Authorized by: Darel Hong, MD   Consent:    Consent obtained:  Verbal   Consent given by:  Patient   Risks discussed:  Bleeding, bowel perforation, infection and pain   Alternatives  discussed:  Alternative treatment Pre-procedure details:    Procedure purpose:  Diagnostic Anesthesia (see MAR for exact dosages):    Anesthesia method:  Local infiltration   Local anesthetic:  Lidocaine 1% w/o epi Procedure details:    Needle gauge: 21.   Ultrasound guidance: yes     Puncture site:  L lower quadrant   Fluid removed amount:  20cc   Fluid appearance:  Serous   Dressing:  Adhesive bandage Post-procedure details:    Patient tolerance of procedure:  Tolerated well,  no immediate complications    Critical Care performed: Yes  ____________________________________________   INITIAL IMPRESSION / ASSESSMENT AND PLAN / ED COURSE  Pertinent labs & imaging results that were available during my care of the patient were reviewed by me and considered in my medical decision making (see chart for details).   As part of my medical decision making, I reviewed the following data within the [place] History obtained from family if available, nursing notes, old chart and ekg, as well as notes from prior ED visits.  The patient comes to the emergency department extremely uncomfortable appearing with 1 week of progressive abdominal pain nausea intermittent vomiting and loose stools.  She has a somewhat distended and tympanitic abdomen that is diffusely tender on arrival and given her multiple abdominal surgeries I have a high clinical suspicion for bowel obstruction.  Given her 50 mcg of IV fentanyl and 2.5 mg of IV Haldol for pain and nausea with improvement in her symptoms and an i-STAT chemistry showed normal renal function so she will be taken emergently to CT scan.  The patient CT scan shows thickened bowel although no obvious transition point.  More notably she has a necrotic appearing mass in her right peri-nephrotic region extending into her psoas muscle that could be an irregular abscess versus malignancy etc.  I performed a bedside paracentesis taking out 20 cc of  serous fluid that does not appear infected and will check cultures and cover her with ceftriaxone and Flagyl in case this represents an infection.  At this point as the patient is dehydrated, has a lactic acidosis, and has persistent abdominal pain with a new mass that requires further investigation she requires inpatient admission.  She verbalizes understanding and agreement with the plan.  And discussed with the hospitalist Dr. salary who has graciously agreed to admit the patient to his service.      ____________________________________________   FINAL CLINICAL IMPRESSION(S) / ED DIAGNOSES  Final diagnoses:  Hypokalemia  Lactic acidosis  Enteritis  Dehydration  Generalized abdominal pain      NEW MEDICATIONS STARTED DURING THIS VISIT:  New Prescriptions   No medications on file     Note:  This document was prepared using Dragon voice recognition software and may include unintentional dictation errors.    Darel Hong, MD 09/25/18 [phone]

## 2018-09-25 NOTE — Evaluation (Signed)
Physical Therapy Evaluation Patient Details Name: [name] MRN: [id number] DOB: August 21, 1956 Today's Date: 09/25/2018   History of Present Illness  63 y.o. female with a known history per below presenting via EMS with mid nonradiating sharp persistent abdominal pain for 1 week, diarrhea during this period of time-resolved.  She reports she has had multiple recent falls, at times passing out w/o warning.  Clinical Impression  Pt eager to work with PT and overall did well.  She was able to get to sitting at EOB and then standing w/o direct physical assist and though she was not as confident with ambulation and all tasks in general at her baseline she was pleasantly surprised at how well she was able to do with walker and light assist.  Pt with some fatigue on prolonged walk, but had no overt safety issues or S/S of passing out, etc.  Pt currently having HHPT and will benefit from continued care, safe to return home with family support from a PT stand-point.   Follow Up Recommendations Home health PT(pt currently having HHPT for general strength, ambulation PT)    Equipment Recommendations  None recommended by PT    Recommendations for Other Services       Precautions / Restrictions Precautions Precautions: None Restrictions Weight Bearing Restrictions: No      Mobility  Bed Mobility Overal bed mobility: Modified Independent                Transfers Overall transfer level: Modified independent Equipment used: Rolling walker (2 wheeled)             General transfer comment: Pt generally did well with mobility, awareness with sitting/standing.  No assist to get to fully upright with walker  Ambulation/Gait Ambulation/Gait assistance: Supervision Gait Distance (Feet): 200 Feet Assistive device: Rolling walker (2 wheeled)       General Gait Details: Pt able to ambulate with smooth, safe, consistent cadence w/o excessive UE use.  Pt not at her baseline, speed,  confidence, tolerance.  Stairs            Wheelchair Mobility    Modified Rankin (Stroke Patients Only)       Balance Overall balance assessment: Modified Independent                                           Pertinent Vitals/Pain Pain Assessment: 0-10 Pain Score: 5  Pain Location: abdominal pain that is better now than arrival    [place] expects to be discharged to:: Private residence Living Arrangements: Spouse/significant other Available Help at Discharge: Family;Available 24 hours/day Type of Home: Mobile home Home Access: Level entry     Home Layout: One level Home Equipment: Walker - 2 wheels;Shower seat;Cane - single point Additional Comments: Pt reports husband available for 24/7 support. Son can also help    Prior Function Level of Independence: Independent with assistive device(s)         Comments: Pt reports mod indep with RW; indep with ADLs. Pt reports she bathes at the sink. Still driving, grocery shopping about 2x/month     Hand Dominance        Extremity/Trunk Assessment   Upper Extremity Assessment Upper Extremity Assessment: Overall WFL for tasks assessed;Generalized weakness    Lower Extremity Assessment Lower Extremity Assessment: Overall WFL for tasks assessed;Generalized weakness       Communication  Communication: No difficulties  Cognition Arousal/Alertness: Awake/alert Behavior During Therapy: WFL for tasks assessed/performed Overall Cognitive Status: Within Functional Limits for tasks assessed                                        General Comments      Exercises     Assessment/Plan    PT Assessment Patient needs continued PT services  PT Problem List Decreased strength;Decreased range of motion;Decreased activity tolerance;Decreased balance;Decreased mobility;Decreased safety awareness;Decreased knowledge of use of DME;Pain;Cardiopulmonary status limiting  activity       PT Treatment Interventions DME instruction;Gait training;Functional mobility training;Therapeutic activities;Therapeutic exercise;Balance training;Neuromuscular re-education;Patient/family education    PT Goals (Current goals can be found in the Care Plan section)  Acute Rehab PT Goals Patient Stated Goal: go home with HHPT to get stronger PT Goal Formulation: With patient Time For Goal Achievement: 10/09/18 Potential to Achieve Goals: Good    Frequency Min 2X/week   Barriers to discharge        Co-evaluation               AM-PAC PT "6 Clicks" Mobility  Outcome Measure Help needed turning from your back to your side while in a flat bed without using bedrails?: None Help needed moving from lying on your back to sitting on the side of a flat bed without using bedrails?: None Help needed moving to and from a bed to a chair (including a wheelchair)?: None Help needed standing up from a chair using your arms (e.g., wheelchair or bedside chair)?: A Little Help needed to walk in hospital room?: A Little Help needed climbing 3-5 steps with a railing? : A Little 6 Click Score: 21    End of Session Equipment Utilized During Treatment: Gait belt Activity Tolerance: Patient limited by fatigue;Patient tolerated treatment well Patient left: with call bell/phone within reach;with bed alarm set   PT Visit Diagnosis: Muscle weakness (generalized) (M62.81);Difficulty in walking, not elsewhere classified (R26.2)    Time: 1429-1450 PT Time Calculation (min) (ACUTE ONLY): 21 min   Charges:   PT Evaluation $PT Eval Low Complexity: 1 Low          Kreg Shropshire, DPT 09/25/2018, 4:23 PM

## 2018-09-25 NOTE — ED Notes (Signed)
Patient transported to CT 

## 2018-09-25 NOTE — ED Notes (Signed)
Patient currently resting in bed peacefully.

## 2018-09-25 NOTE — Progress Notes (Signed)
[facility] NAME: [name]    MR#:  [id number]  DATE OF BIRTH:  12-18-1955  SUBJECTIVE:  CHIEF COMPLAINT:   Chief Complaint  Patient presents with  . Abdominal Pain   -Has distended abdomen, still complains of diffuse pain. -Denies any right flank pain.  Neck no fevers  REVIEW OF SYSTEMS:  Review of Systems  Constitutional: Positive for malaise/fatigue. Negative for chills and fever.  HENT: Negative for congestion, ear discharge, hearing loss and nosebleeds.   Eyes: Negative for blurred vision and double vision.  Respiratory: Negative for cough, shortness of breath and wheezing.   Cardiovascular: Negative for chest pain and palpitations.  Gastrointestinal: Positive for abdominal pain. Negative for constipation, diarrhea, nausea and vomiting.  Genitourinary: Negative for dysuria.  Musculoskeletal: Negative for myalgias.  Neurological: Negative for dizziness, focal weakness, seizures, weakness and headaches.  Psychiatric/Behavioral: Negative for depression.    DRUG ALLERGIES:   Allergies  Allergen Reactions  . Hydrocodone Other (See Comments)    Pt states that this medication caused cirrhosis of the liver.    . Aspirin   . Erythromycin Other (See Comments)    Reaction:  Fever   . Prednisone Other (See Comments)    Reaction:  Unknown   . Rosiglitazone Maleate Swelling  . Codeine Sulfate Rash  . Tetanus-Diphtheria Toxoids Td Rash and Other (See Comments)    Reaction:  Fever     VITALS:  Blood pressure 138/77, pulse 75, temperature 97.7 F (36.5 C), temperature source Oral, resp. rate 16, height 5' 3"  (1.6 m), weight 56.7 kg, SpO2 100 %.  PHYSICAL EXAMINATION:  Physical Exam   GENERAL:  63 y.o.-year-old patient lying in the bed with no acute distress.  Appears chronically ill EYES: Pupils equal, round, reactive to light and accommodation. No scleral icterus. Extraocular muscles intact.  HEENT: Head  atraumatic, normocephalic. Oropharynx and nasopharynx clear.  NECK:  Supple, no jugular venous distention. No thyroid enlargement, no tenderness.  LUNGS: Normal breath sounds bilaterally, no wheezing, rales,rhonchi or crepitation. No use of accessory muscles of respiration.  Decreased bibasilar breath sounds  CARDIOVASCULAR: S1, S2 normal. No  rubs, or gallops.  2/6 systolic murmur is present ABDOMEN: Soft, distended, generalized tenderness with no guarding or rigidity noted.  Hypoactive Bowel sounds present. No organomegaly or mass.  EXTREMITIES: No pedal edema, cyanosis, or clubbing.  NEUROLOGIC: Cranial nerves II through XII are intact. Muscle strength 5/5 in all extremities. Sensation intact. Gait not checked.  PSYCHIATRIC: The patient is alert and oriented x 3.  SKIN: No obvious rash, lesion, or ulcer.    LABORATORY PANEL:   CBC Recent Labs  Lab 09/25/18 0410 09/25/18 0419  WBC 7.0  --   HGB 10.5* 11.6*  HCT 34.1* 34.0*  PLT 189  --    ------------------------------------------------------------------------------------------------------------------  Chemistries  Recent Labs  Lab 09/25/18 0410 09/25/18 0419  NA 137 139  K 2.8* 3.0*  CL 107 106  CO2 21*  --   GLUCOSE 95 91  BUN 16 16  CREATININE 0.79 0.70  CALCIUM 8.4*  --   MG 1.9  --   AST 34  --   ALT 18  --   ALKPHOS 115  --   BILITOT 1.0  --    ------------------------------------------------------------------------------------------------------------------  Cardiac Enzymes No results for input(s): TROPONINI in the last 168 hours. ------------------------------------------------------------------------------------------------------------------  RADIOLOGY:  Dg Abdomen 1 View  Result Date: 09/25/2018 CLINICAL DATA:  Abdominal pain and distention  EXAM: ABDOMEN - 1 VIEW COMPARISON:  08/18/2018 FINDINGS: There are a few borderline dilated small bowel loops, but gas is also seen throughout the colon. A similar  appearance was present on comparison CT scanogram. Right-sided internal ureteral stent in good position. Cholecystectomy clips. IMPRESSION: 1. There are a few mildly dilated small bowel loops, but overall nonobstructive pattern similar to prior study. 2. Ureteral stent on the right that is in good position. Electronically Signed   By: Monte Fantasia M.D.   On: 09/25/2018 04:31   Ct Abdomen Pelvis W Contrast  Result Date: 09/25/2018 CLINICAL DATA:  Abdominal pain EXAM: CT ABDOMEN AND PELVIS WITH CONTRAST TECHNIQUE: Multidetector CT imaging of the abdomen and pelvis was performed using the standard protocol following bolus administration of intravenous contrast. CONTRAST:  150m OMNIPAQUE IOHEXOL 300 MG/ML  SOLN COMPARISON:  08/18/2018 FINDINGS: Lower chest:  Borderline heart size.  No acute finding Hepatobiliary: Cirrhotic liver morphology with lower periesophageal varices and moderate free flowing ascites.Cholecystectomy with normal common bile duct diameter Pancreas: Unremarkable. Spleen: Enlarged in the setting of portal hypertension Adrenals/Urinary Tract: Negative adrenals. Right ureteral stent with mild right hydronephrosis and mild upper right urothelial thickening. There is enhancing centrally low-density masslike appearance in the right inferior perinephric space measuring up to 3.6 cm and continuing into the right psoas where low-density collection measures 15 mm. This mass is in close proximity to the mid duodenum and the ascending colon, without definite fistula. Stomach/Bowel: Esophageal varices as noted above. There is mild thickening of the ascending colon which may be from portal hypertension. Diffuse fluid filled small-bowel loops without noted obstruction point. No appendicitis. Vascular/Lymphatic: No acute vascular abnormality. Retroperitoneal findings described above. No worrisome nodal enlargement Reproductive:Hysterectomy Other: Moderate ascites.  No pneumoperitoneum Musculoskeletal: No  acute finding.  Thoracolumbar levoscoliosis. IMPRESSION: 1. Irregular abscess or necrotic mass in the right perinephric space extending into the right psoas-extent similar to 08/18/2018 CT. There is chronic right renal stenting and this is likely chronic abscess in this diabetic. There is also distortion and continuity of the descending duodenum (which was markedly inflamed on a 2017 CT), but no clear fistula. Tumor is not favored but sampling should be considered. 2. Cirrhosis with moderate ascites and esophageal varices. 3. Fluid-filled bowel without transition point. 4. Right ureteral stent is in good position. There is mild right hydronephrosis that is improved from 08/18/2018. Electronically Signed   By: JMonte FantasiaM.D.   On: 09/25/2018 05:03    EKG:   Orders placed or performed during the hospital encounter of 09/25/18  . EKG 12-Lead  . EKG 12-Lead    ASSESSMENT AND PLAN:   63year old female with past medical history significant for alcohol use, liver cirrhosis, history of uterine cancer, paroxysmal A. fib on Eliquis, hypertension, hypothyroidism, type 1 diabetes mellitus presents to hospital secondary to diffuse abdominal pain  1.  Abdominal pain-could be from ascites versus CT of the abdomen showing a right renal mass -Agree with ultrasound-guided paracentesis.  Empirically started on Rocephin and Flagyl -Appreciate urology consult.  Patient has a retained right ureteral stent and possible perinephric abscess versus mass.  Urology recommended 2 weeks of antibiotics and outpatient follow-up for procedure. -Urine analysis sent and urine culture added. -Outpatient stent removal and follow-up CT as outpatient for resolution of the abscess versus mass. -Diflucan for yeast in urine  2.  Alcoholic liver cirrhosis-with ascites.  Abdomen is distended. -Empiric antibiotics for now. -Ultrasound-guided paracentesis requested for  3.  Diabetes mellitus with hyperglycemia-since patient  was  n.p.o. this morning.  Hold her long-acting insulin.  Sliding scale insulin can be continued. -Started on a clear liquid diet now  4.  Hypokalemia-being replaced  5.  DVT prophylaxis-Eliquis on hold for possible procedures. -Started Lovenox for DVT prophylaxis  Encourage ambulation as tolerated   All the records are reviewed and case discussed with Care Management/Social Workerr. Management plans discussed with the patient, family and they are in agreement.  CODE STATUS: Full code  TOTAL TIME TAKING CARE OF THIS PATIENT: 37 minutes.   POSSIBLE D/C IN 2-3 DAYS, DEPENDING ON CLINICAL CONDITION.   Gladstone Lighter M.D on 09/25/2018 at 2:46 PM  Between 7am to 6pm - Pager - [phone]  After 6pm go to [email or web address] - Proofreader  [facility]  Office  [phone]  CC: Primary care physician; [name], NP

## 2018-09-25 NOTE — Telephone Encounter (Signed)
Called to speak to pt. Her husband, [name], answered and said she woke him up at 330 am with terrible pain and he took her back to [facility]. She is still there.

## 2018-09-25 NOTE — ED Triage Notes (Signed)
Patient arrived by [facility] from home. Patient has complaints of abdominal pain 10/10 that has been going on for over a week with diarrhea. Patient states has not had episode of diarrhea in over 24 hours because she has not ate or drank anything in over 24 hours. Patient states when she eats or drinks she has diarrhea. Patient came to ED yesterday but left because she states wait time was too long.

## 2018-09-25 NOTE — Telephone Encounter (Signed)
Noted. Patient currently admitted.

## 2018-09-25 NOTE — H&P (Signed)
[name] at [facility] [place] NAME: [name]    MR#:  [id number]  DATE OF BIRTH:  07/14/1956  DATE OF ADMISSION:  09/25/2018  PRIMARY CARE PHYSICIAN: [name], NP   REQUESTING/REFERRING PHYSICIAN:   CHIEF COMPLAINT:   Chief Complaint  Patient presents with  . Abdominal Pain    HISTORY OF PRESENT ILLNESS: [name]  is a 63 y.o. female with a known history per below presenting via EMS with mid nonradiating sharp persistent abdominal pain for 1 week, diarrhea during this period of time-resolved over the last 48 hours, no p.o. intake in 2 days per patient, worsening abdominal distention over the last 1 week, patient seen by her primary care provider on yesterday-sent to the ER for work-up, left due to long wait time, in the emergency room patient was found to have potassium 2.8, bicarb 21, hemoglobin 10.5, procalcitonin level was normal, lactic acid 2, KUB with few dilated small bowel loops, CT abdomen with numerous abnormalities-see report, patient evaluated in the emergency room, no apparent distress, resting comfortably in bed, abdomen is nontender/nontympanitic/no rebound/no guarding, ED attending did draw some fluid off for analysis-results pending/clear per ED attending, patient is now being admitted for acute abdominal pain secondary to unknown etiology.  PAST MEDICAL HISTORY:   Past Medical History:  Diagnosis Date  . Acute encephalopathy 05/22/2018  . Allergy   . Anxiety   . Ascites   . C. difficile colitis 07/10/2015  . Cancer (HCC)    HX OF CANCER OF UTERUS   . Cirrhosis of liver not due to alcohol ([place]) 2016  . Degenerative disk disease   . Diverticulitis   . Gastroparesis   . GERD (gastroesophageal reflux disease)   . History of hiatal hernia   . Hypertension   . Hypothyroid   . Hypothyroidism due to amiodarone   . Ileus ([place]) 08/01/2015  . Intussusception intestine ([place]) 05/2015  . Orthostatic hypotension   . PAF  (paroxysmal atrial fibrillation) ([place]) 03/2015   a. new onset 03/2015 in setting of intractable N/V; b. on Eliquis 5 mg bid; c. CHADSVASc 4 (DM, TIA x 2, female)  . Pancreatitis   . Pneumonia 11/14/2015  . Right ureteral stone 07/14/2016  . Sick sinus syndrome ([place])   . Stomach ulcer   . Stroke [facility])    with minimal left sided weakness  . Syncope 01/2015  . Syncope due to orthostatic hypotension 05/18/2015  . Tachyarrhythmia 01/10/2016  . TIA (transient ischemic attack) 02/2015  . Type 1 diabetes ([place])    on levemir  . UTERINE CANCER, HX OF 03/27/2007   Qualifier: Diagnosis of  By: Maxie Better FNP, Rosalita Levan   . UTI (urinary tract infection) 05/22/2018    PAST SURGICAL HISTORY:  Past Surgical History:  Procedure Laterality Date  . ABDOMINAL HYSTERECTOMY    . CARDIAC CATHETERIZATION N/A 01/12/2016   Procedure: Left Heart Cath and Coronary Angiography;  Surgeon: Wellington Hampshire, MD;  Location: [place] [facility] LAB;  Service: Cardiovascular;  Laterality: N/A;  . CHOLECYSTECTOMY    . CYSTOSCOPY/URETEROSCOPY/HOLMIUM LASER Right 07/14/2016   Procedure: CYSTOSCOPY/URETEROSCOPY/HOLMIUM LASER;  Surgeon: Alexis Frock, MD;  Location: [facility] ORS;  Service: Urology;  Laterality: Right;  . ESOPHAGOGASTRODUODENOSCOPY N/A 04/04/2015   Procedure: ESOPHAGOGASTRODUODENOSCOPY (EGD);  Surgeon: Hulen Luster, MD;  Location: [facility] ENDOSCOPY;  Service: Endoscopy;  Laterality: N/A;  . ESOPHAGOGASTRODUODENOSCOPY N/A 12/28/2017   Procedure: ESOPHAGOGASTRODUODENOSCOPY (EGD);  Surgeon: Lin Landsman, MD;  Location[facility] ENDOSCOPY;  Service: Gastroenterology;  Laterality: N/A;  . ESOPHAGOGASTRODUODENOSCOPY (EGD) WITH PROPOFOL N/A 01/18/2016   Procedure: ESOPHAGOGASTRODUODENOSCOPY (EGD) WITH PROPOFOL;  Surgeon: Lucilla Lame, MD;  Location: [facility] ENDOSCOPY;  Service: Endoscopy;  Laterality: N/A;  . FLEXIBLE SIGMOIDOSCOPY N/A 01/18/2016   Procedure: FLEXIBLE SIGMOIDOSCOPY;  Surgeon: Lucilla Lame, MD;  Location: [facility] ENDOSCOPY;   Service: Endoscopy;  Laterality: N/A;  . HERNIA REPAIR      SOCIAL HISTORY:  Social History   Tobacco Use  . Smoking status: Former Smoker    Types: Cigarettes  . Smokeless tobacco: Never Used  . Tobacco comment: 25 years ago and only smoked occasionally  Substance Use Topics  . Alcohol use: No    FAMILY HISTORY:  Family History  Problem Relation Age of Onset  . Hypertension Mother   . CAD Sister   . Heart attack Sister        Deceased 11-16-14  . CAD Brother     DRUG ALLERGIES:  Allergies  Allergen Reactions  . Hydrocodone Other (See Comments)    Pt states that this medication caused cirrhosis of the liver.    . Aspirin   . Erythromycin Other (See Comments)    Reaction:  Fever   . Prednisone Other (See Comments)    Reaction:  Unknown   . Rosiglitazone Maleate Swelling  . Codeine Sulfate Rash  . Tetanus-Diphtheria Toxoids Td Rash and Other (See Comments)    Reaction:  Fever     REVIEW OF SYSTEMS:   CONSTITUTIONAL: No fever, fatigue or weakness.  EYES: No blurred or double vision.  EARS, NOSE, AND THROAT: No tinnitus or ear pain.  RESPIRATORY: No cough, shortness of breath, wheezing or hemoptysis.  CARDIOVASCULAR: No chest pain, orthopnea, edema.  GASTROINTESTINAL: No nausea, vomiting,+ diarrhea, abdominal pain.  GENITOURINARY: No dysuria, hematuria.  ENDOCRINE: No polyuria, nocturia,  HEMATOLOGY: No anemia, easy bruising or bleeding SKIN: No rash or lesion. MUSCULOSKELETAL: No joint pain or arthritis.   NEUROLOGIC: No tingling, numbness, weakness.  PSYCHIATRY: No anxiety or depression.   MEDICATIONS AT HOME:  Prior to Admission medications   Medication Sig Start Date End Date Taking? Authorizing Provider  albuterol (PROVENTIL HFA;VENTOLIN HFA) 108 (90 Base) MCG/ACT inhaler Inhale 2 puffs into the lungs every 6 (six) hours as needed for wheezing or shortness of breath. 12/17/17   Henreitta Leber, MD  apixaban (ELIQUIS) 5 MG TABS tablet Take 1 tablet (5 mg  total) by mouth 2 (two) times daily. 08/14/18 09/13/18  [name], NP  apixaban (ELIQUIS) 5 MG TABS tablet Take 5 mg by mouth 2 (two) times daily.    [provider]  atorvastatin (LIPITOR) 40 MG tablet Take 1 tablet (40 mg total) by mouth daily. For cholesterol. 08/14/18 09/13/18  [name], NP  atorvastatin (LIPITOR) 40 MG tablet Take 40 mg by mouth daily.    [provider]  Blood Glucose Monitoring Suppl (CONTOUR BLOOD GLUCOSE SYSTEM) w/Device KIT Use to test blood sugar 3 times daily. Dx is E11.65 08/28/18   [name], NP  cyclobenzaprine (FLEXERIL) 10 MG tablet Take 1 tablet (10 mg total) by mouth 3 (three) times daily as needed for muscle spasms. 08/28/18   [name], NP  glucose blood test strip Contour Test Strips. Use as instructed to test blood sugar 3 times daily. 08/28/18   [name], NP  Insulin Detemir (LEVEMIR FLEXPEN) 100 UNIT/ML Pen Inject 15 Units into the skin 2 (two) times daily. 08/14/18   Alma Friendly  K, NP  Lancet Device MISC Use as instructed to test blood sugar 3 times daily. Dx is E11.65 08/28/18   [name], NP  metoprolol tartrate (LOPRESSOR) 25 MG tablet Take 0.5 tablets (12.5 mg total) by mouth 2 (two) times daily. For blood pressure. 08/28/18 09/27/18  [name], NP      PHYSICAL EXAMINATION:   VITAL SIGNS: Blood pressure (!) 155/81, pulse 98, temperature 98 F (36.7 C), temperature source Oral, resp. rate 15, height _0  (1.6 m), weight 56.7 kg, SpO2 99 %.  GENERAL:  63 y.o.-year-old patient lying in the bed with no acute distress.  Frail-appearing EYES: Pupils equal, round, reactive to light and accommodation. No scleral icterus. Extraocular muscles intact.  HEENT: Head atraumatic, normocephalic. Oropharynx and nasopharynx clear.  NECK:  Supple, no jugular venous distention. No thyroid enlargement, no tenderness.  LUNGS: Normal breath sounds bilaterally, no wheezing, rales,rhonchi or  crepitation. No use of accessory muscles of respiration.  CARDIOVASCULAR: S1, S2 normal. No murmurs, rubs, or gallops.  ABDOMEN: Distended abdomen, nontympanitic, no rebound/guarding. Bowel sounds present. No organomegaly or mass.  EXTREMITIES: No pedal edema, cyanosis, or clubbing.  Diffuse muscular atrophy NEUROLOGIC: Cranial nerves II through XII are intact. MAES. Gait not checked.  PSYCHIATRIC: The patient is alert and oriented x 3.  SKIN: No obvious rash, lesion, or ulcer.   LABORATORY PANEL:   CBC Recent Labs  Lab 09/25/18 0410 09/25/18 0419  WBC 7.0  --   HGB 10.5* 11.6*  HCT 34.1* 34.0*  PLT 189  --   MCV 90.2  --   MCH 27.8  --   MCHC 30.8  --   RDW 18.3*  --   LYMPHSABS 0.8  --   MONOABS 0.4  --   EOSABS 0.1  --   BASOSABS 0.0  --    ------------------------------------------------------------------------------------------------------------------  Chemistries  Recent Labs  Lab 09/25/18 0410 09/25/18 0419  NA 137 139  K 2.8* 3.0*  CL 107 106  CO2 21*  --   GLUCOSE 95 91  BUN 16 16  CREATININE 0.79 0.70  CALCIUM 8.4*  --   AST 34  --   ALT 18  --   ALKPHOS 115  --   BILITOT 1.0  --    ------------------------------------------------------------------------------------------------------------------ estimated creatinine clearance is 58.8 mL/min (by C-G formula based on SCr of 0.7 mg/dL). ------------------------------------------------------------------------------------------------------------------ Recent Labs    09/25/18 0410  TSH 5.301*     Coagulation profile Recent Labs  Lab 09/25/18 0410  INR 1.15   ------------------------------------------------------------------------------------------------------------------- No results for input(s): DDIMER in the last 72 hours. -------------------------------------------------------------------------------------------------------------------  Cardiac Enzymes No results for input(s): CKMB,  TROPONINI, MYOGLOBIN in the last 168 hours.  Invalid input(s): CK ------------------------------------------------------------------------------------------------------------------ Invalid input(s): POCBNP  ---------------------------------------------------------------------------------------------------------------  Urinalysis    Component Value Date/Time   COLORURINE ORANGE (A) 08/19/2018 1321   APPEARANCEUR TURBID (A) 08/19/2018 1321   APPEARANCEUR Clear 10/08/2014 0144   LABSPEC >1.030 (H) 08/19/2018 1321   LABSPEC 1.033 10/08/2014 0144   PHURINE 5.0 08/19/2018 1321   GLUCOSEU NEGATIVE 08/19/2018 1321   GLUCOSEU >=500 10/08/2014 0144   HGBUR LARGE (A) 08/19/2018 1321   BILIRUBINUR MODERATE (A) 08/19/2018 1321   BILIRUBINUR Negative 10/08/2014 0144   KETONESUR 15 (A) 08/19/2018 1321   PROTEINUR 100 (A) 08/19/2018 1321   UROBILINOGEN 0.2 05/27/2015 1936   NITRITE NEGATIVE 08/19/2018 1321   LEUKOCYTESUR MODERATE (A) 08/19/2018 1321   LEUKOCYTESUR Negative 10/08/2014 0144     RADIOLOGY: Dg Abdomen 1 View  Result Date: 09/25/2018 CLINICAL DATA:  Abdominal pain and distention EXAM: ABDOMEN - 1 VIEW COMPARISON:  08/18/2018 FINDINGS: There are a few borderline dilated small bowel loops, but gas is also seen throughout the colon. A similar appearance was present on comparison CT scanogram. Right-sided internal ureteral stent in good position. Cholecystectomy clips. IMPRESSION: 1. There are a few mildly dilated small bowel loops, but overall nonobstructive pattern similar to prior study. 2. Ureteral stent on the right that is in good position. Electronically Signed   By: Monte Fantasia M.D.   On: 09/25/2018 04:31   Ct Abdomen Pelvis W Contrast  Result Date: 09/25/2018 CLINICAL DATA:  Abdominal pain EXAM: CT ABDOMEN AND PELVIS WITH CONTRAST TECHNIQUE: Multidetector CT imaging of the abdomen and pelvis was performed using the standard protocol following bolus administration of  intravenous contrast. CONTRAST:  13m OMNIPAQUE IOHEXOL 300 MG/ML  SOLN COMPARISON:  08/18/2018 FINDINGS: Lower chest:  Borderline heart size.  No acute finding Hepatobiliary: Cirrhotic liver morphology with lower periesophageal varices and moderate free flowing ascites.Cholecystectomy with normal common bile duct diameter Pancreas: Unremarkable. Spleen: Enlarged in the setting of portal hypertension Adrenals/Urinary Tract: Negative adrenals. Right ureteral stent with mild right hydronephrosis and mild upper right urothelial thickening. There is enhancing centrally low-density masslike appearance in the right inferior perinephric space measuring up to 3.6 cm and continuing into the right psoas where low-density collection measures 15 mm. This mass is in close proximity to the mid duodenum and the ascending colon, without definite fistula. Stomach/Bowel: Esophageal varices as noted above. There is mild thickening of the ascending colon which may be from portal hypertension. Diffuse fluid filled small-bowel loops without noted obstruction point. No appendicitis. Vascular/Lymphatic: No acute vascular abnormality. Retroperitoneal findings described above. No worrisome nodal enlargement Reproductive:Hysterectomy Other: Moderate ascites.  No pneumoperitoneum Musculoskeletal: No acute finding.  Thoracolumbar levoscoliosis. IMPRESSION: 1. Irregular abscess or necrotic mass in the right perinephric space extending into the right psoas-extent similar to 08/18/2018 CT. There is chronic right renal stenting and this is likely chronic abscess in this diabetic. There is also distortion and continuity of the descending duodenum (which was markedly inflamed on a 2017 CT), but no clear fistula. Tumor is not favored but sampling should be considered. 2. Cirrhosis with moderate ascites and esophageal varices. 3. Fluid-filled bowel without transition point. 4. Right ureteral stent is in good position. There is mild right  hydronephrosis that is improved from 08/18/2018. Electronically Signed   By: JMonte FantasiaM.D.   On: 09/25/2018 05:03    EKG: Orders placed or performed during the hospital encounter of 09/25/18  . EKG 12-Lead  . EKG 12-Lead    IMPRESSION AND PLAN: *Acute mid persistent abdominal pain Exact etiology is unknown Suspect secondary to cirrhosis with ascites -patient denies this diagnosis despite it being documented in her records at multiple hospitals Admit to regular nursing for bed, n.p.o. except for meds, adult pain protocol, consult gastroenterology for expert opinion, check hepatitis panel, consult IR for paracentesis, hold Eliquis for now, GI cocktail x1, PPI daily, serial abdominal examinations, check ammonia level, and continue close medical monitoring  *Acute hypokalemia Replete with IV/p.o. potassium, check magnesium level, BMP in the morning  *Acute diarrhea Resolved 2 days ago Strict I&O monitoring, gentle IV fluids for rehydration  *Abnormal CT of the abdomen Noted irregular abscess or necrotic mass of the right perinephric space extending into the right psoas muscle-unchanged from August 18, 2018 Chronic right ureteral stent-urology did see patient last month was instructed  to follow-up as an outpatient for possible stent removal-has appointment for February 2019 Noted cirrhosis with moderate ascites/varices  *Chronic nonalcoholic liver cirrhosis with moderate ascites and varices Plan of care as stated above  *Chronic insulin-dependent diabetes mellitus Reduce twice daily Lantus dosing,/scale insulin with Accu-Cheks per routine  *History of paroxysmal A. Fib Stable Continue Lopressor, Eliquis on hold for paracentesis  *History of peptic ulcer disease/GERD Status post Nissen fundoplication PPI daily    All the records are reviewed and case discussed with ED provider. Management plans discussed with the patient, family and they are in agreement.  CODE  STATUS:full Code Status History    Date Active Date Inactive Code Status Order ID Comments User Context   08/18/2018 2234 08/20/2018 1923 DNR [id number]  Rise Patience, MD ED   08/18/2018 2136 08/18/2018 2234 Full Code [id number]  Rise Patience, MD ED   05/22/2018 1101 05/24/2018 1850 Full Code [id number]  Molt, [place], DO ED   12/27/2017 1245 12/30/2017 2040 Full Code [id number]  Demetrios Loll, MD Inpatient   12/15/2017 0501 12/17/2017 1854 Full Code [id number]  Harrie Foreman, MD Inpatient   08/06/2016 2036 08/09/2016 1945 Full Code [id number]  Vaughan Basta, MD ED   07/14/2016 0426 07/17/2016 1934 Full Code [id number]  Harrie Foreman, MD Inpatient   07/09/2016 0453 07/10/2016 1708 Full Code [id number]  Lance Coon, MD Inpatient   06/17/2016 2245 06/20/2016 1509 Full Code [id number]  Lance Coon, MD Inpatient   06/02/2016 0546 06/06/2016 1821 DNR [id number]  Harrie Foreman, MD Inpatient   05/12/2016 1143 05/16/2016 1638 DNR [id number]  Dustin Flock, MD Inpatient   05/12/2016 0113 05/12/2016 1143 Full Code [id number]  [place], [place], DO Inpatient   04/07/2016 0618 04/09/2016 1419 DNR [id number]  Harrie Foreman, MD Inpatient   03/13/2016 2125 03/16/2016 1832 DNR [id number]  Lance Coon, MD Inpatient   02/04/2016 0939 02/07/2016 1459 DNR [id number]  Saundra Shelling, MD Inpatient   01/21/2016 2224 01/25/2016 1702 DNR [id number]  Lance Coon, MD Inpatient   01/15/2016 1339 01/19/2016 1529 DNR [id number]  Gladstone Lighter, MD ED   01/10/2016 2200 01/13/2016 1724 DNR [id number]  Harrie Foreman, MD Inpatient   12/27/2015 2302 01/01/2016 1817 DNR [id number]  Max Sane, MD Inpatient   12/16/2015 1654 12/19/2015 1652 DNR [id number]  Loletha Grayer, MD ED   11/11/2015 1136 11/16/2015 1405 Full Code [id number]  Samella Parr, NP Inpatient   10/15/2015 0123 10/16/2015 1817 Full Code [id number]  Lance Coon, MD Inpatient   10/04/2015 2019 10/08/2015 1834 Full Code [id number]  Bethena Roys, MD Inpatient    08/15/2015 0105 08/16/2015 2004 Full Code [id number]  Harrie Foreman, MD Inpatient   08/01/2015 0125 08/06/2015 1741 Full Code [id number]  Harrie Foreman, MD Inpatient   07/04/2015 0253 07/11/2015 1833 Full Code [id number]  Lytle Butte, MD ED   05/27/2015 2248 05/31/2015 1437 Full Code [id number]  Rise Patience, MD Inpatient   05/19/2015 0114 05/22/2015 1628 Full Code [id number]  Rise Patience, MD Inpatient   04/05/2015 0916 04/07/2015 1723 Full Code [id number]  Bettey Costa, MD Inpatient   04/03/2015 1231 04/05/2015 0916 Full Code [id number]  Bettey Costa, MD Inpatient   03/11/2015 1649 03/14/2015 1550 Full Code [id number]  Dustin Flock, MD Inpatient   02/15/2015 1651 02/17/2015 1433 DNR [id number]  Dustin Flock, MD Inpatient   02/15/2015 1635 02/15/2015 1651 Full Code [id number]  Dustin Flock, MD Inpatient   01/11/2015 0240 01/12/2015 1811 Full Code [id number]  Etta Quill,  DO ED    Questions for Most Recent Historical Code Status (Order [id number])    Question Answer Comment   In the event of cardiac or respiratory ARREST Do not call a "code blue"    In the event of cardiac or respiratory ARREST Do not perform Intubation, CPR, defibrillation or ACLS    In the event of cardiac or respiratory ARREST Use medication by any route, position, wound care, and other measures to relive pain and suffering. May use oxygen, suction and manual treatment of airway obstruction as needed for comfort.        TOTAL TIME TAKING CARE OF THIS PATIENT: 45 minutes.    Avel Peace Zymiere Trostle M.D on 09/25/2018   Between 7am to 6pm - Pager - [phone]  After 6pm go to [email or web address] - password [email or web address] [facility]  Office  [phone]  CC: Primary care physician; [name], NP   Note: This dictation was prepared with Dragon dictation along with smaller phrase technology. Any transcriptional errors that result from this process are unintentional.

## 2018-09-25 NOTE — Progress Notes (Signed)
Hypoglycemic Event  CBG: 32 - 43 - 52  Treatment: orange juice   Symptoms: dizziness  Follow-up CBG Result: 91  Comments/MD notified: Dr. Tressia Miners notified on unit   Montgomery S

## 2018-09-25 NOTE — Consult Note (Signed)
Discussed with Dr Gweneth Dimitri, No GI consult for now will call if needed  Dr Jonathon Bellows MD,MRCP [facility]) Gastroenterology/Hepatology Pager: [phone]

## 2018-09-25 NOTE — Consult Note (Addendum)
1:06 PM   [name] 08/24/1956 [id number]  CC: Retained right ureteral stent, abnormal CT findings  HPI: I saw [name] in consultation for right retained ureteral stent and abnormal CT findings with possible perinephric mass from Dr. Tressia Miners.  She is a very co-morbid 63 year old female with atrial fibrillation on Eliquis and cirrhosis currently admitted to the hospitalist with epigastric abdominal pain and diarrhea.  She had a right ureteral stone treated by Dr. Tresa Moore in November 2017 with stent placement, and unfortunately was lost to follow-up and her stent remains in place.  She was also seen by Dr. Gloriann Loan in December 2019 who recommended follow-up for stent removal, but she did not follow-up as scheduled.  She is afebrile and denies any flank pain.  She has no difficulty voiding, and no gross hematuria.  She reports 20 pounds of weight loss over the last 4 days with her severe diarrhea.  Her CT scan also showed a possible right perinephric mass versus abscess, which appears relatively stable from her last CT scan on December 19th, 2019.    PMH: Past Medical History:  Diagnosis Date  . Acute encephalopathy 05/22/2018  . Allergy   . Anxiety   . Ascites   . C. difficile colitis 07/10/2015  . Cancer (HCC)    HX OF CANCER OF UTERUS   . Cirrhosis of liver not due to alcohol ([place]) 2016  . Degenerative disk disease   . Diverticulitis   . Gastroparesis   . GERD (gastroesophageal reflux disease)   . History of hiatal hernia   . Hypertension   . Hypothyroid   . Hypothyroidism due to amiodarone   . Ileus ([place]) 08/01/2015  . Intussusception intestine ([place]) 05/2015  . Orthostatic hypotension   . PAF (paroxysmal atrial fibrillation) ([place]) 03/2015   a. new onset 03/2015 in setting of intractable N/V; b. on Eliquis 5 mg bid; c. CHADSVASc 4 (DM, TIA x 2, female)  . Pancreatitis   . Pneumonia 11/14/2015  . Right ureteral stone 07/14/2016  . Sick sinus syndrome ([place])   . Stomach ulcer     . Stroke [facility])    with minimal left sided weakness  . Syncope 01/2015  . Syncope due to orthostatic hypotension 05/18/2015  . Tachyarrhythmia 01/10/2016  . TIA (transient ischemic attack) 02/2015  . Type 1 diabetes ([place])    on levemir  . UTERINE CANCER, HX OF 03/27/2007   Qualifier: Diagnosis of  By: Maxie Better FNP, Rosalita Levan   . UTI (urinary tract infection) 05/22/2018    Surgical History: Past Surgical History:  Procedure Laterality Date  . ABDOMINAL HYSTERECTOMY    . CARDIAC CATHETERIZATION N/A 01/12/2016   Procedure: Left Heart Cath and Coronary Angiography;  Surgeon: Wellington Hampshire, MD;  Location: [place] CV LAB;  Service: Cardiovascular;  Laterality: N/A;  . CHOLECYSTECTOMY    . CYSTOSCOPY/URETEROSCOPY/HOLMIUM LASER Right 07/14/2016   Procedure: CYSTOSCOPY/URETEROSCOPY/HOLMIUM LASER;  Surgeon: Alexis Frock, MD;  Location: [facility] ORS;  Service: Urology;  Laterality: Right;  . ESOPHAGOGASTRODUODENOSCOPY N/A 04/04/2015   Procedure: ESOPHAGOGASTRODUODENOSCOPY (EGD);  Surgeon: Hulen Luster, MD;  Location: [facility] ENDOSCOPY;  Service: Endoscopy;  Laterality: N/A;  . ESOPHAGOGASTRODUODENOSCOPY N/A 12/28/2017   Procedure: ESOPHAGOGASTRODUODENOSCOPY (EGD);  Surgeon: Lin Landsman, MD;  Location[facility] ENDOSCOPY;  Service: Gastroenterology;  Laterality: N/A;  . ESOPHAGOGASTRODUODENOSCOPY (EGD) WITH PROPOFOL N/A 01/18/2016   Procedure: ESOPHAGOGASTRODUODENOSCOPY (EGD) WITH PROPOFOL;  Surgeon: Lucilla Lame, MD;  Location: [facility] ENDOSCOPY;  Service: Endoscopy;  Laterality: N/A;  . FLEXIBLE  SIGMOIDOSCOPY N/A 01/18/2016   Procedure: FLEXIBLE SIGMOIDOSCOPY;  Surgeon: Lucilla Lame, MD;  Location: [facility] ENDOSCOPY;  Service: Endoscopy;  Laterality: N/A;  . HERNIA REPAIR     Allergies:  Allergies  Allergen Reactions  . Hydrocodone Other (See Comments)    Pt states that this medication caused cirrhosis of the liver.    . Aspirin   . Erythromycin Other (See Comments)    Reaction:  Fever   .  Prednisone Other (See Comments)    Reaction:  Unknown   . Rosiglitazone Maleate Swelling  . Codeine Sulfate Rash  . Tetanus-Diphtheria Toxoids Td Rash and Other (See Comments)    Reaction:  Fever     Family History: Family History  Problem Relation Age of Onset  . Hypertension Mother   . CAD Sister   . Heart attack Sister        Deceased 11-10-2014  . CAD Brother     Social History:  reports that she has quit smoking. Her smoking use included cigarettes. She has never used smokeless tobacco. She reports that she does not drink alcohol or use drugs.  ROS: Please see flowsheet from today's date for complete review of systems.  Physical Exam: BP 138/77 (BP Location: Right Arm)   Pulse 75   Temp 97.7 F (36.5 C) (Oral)   Resp 16   Ht _0  (1.6 m)   Wt 56.7 kg   SpO2 100%   BMI 22.14 kg/m    Constitutional:  Alert and oriented, No acute distress. Cardiovascular: No clubbing, cyanosis, or edema. Respiratory: Normal respiratory effort, no increased work of breathing. GI: Abdomen is significantly distended GU: No CVA tenderness Lymph: No cervical or inguinal lymphadenopathy. Skin: No rashes, bruises or suspicious lesions. Neurologic: Grossly intact, no focal deficits, moving all 4 extremities. Psychiatric: Normal mood and affect.  Laboratory Data: Reviewed sCr 0.7, eGFR>60 WBC 7.0  Urinalysis greater than 50 WBCs, 11-20 RBCs, no bacteria, yeast present  Pertinent Imaging: I have personally reviewed the CT abdomen pelvis with contrast dated 09/25/2018.  There is a indwelling right ureteral stent, does not appear to be any encrustation or stone alongside the stent.  There is a irregular abscess versus necrotic mass in the right perinephric space extending into the right psoas similar to previous scan 08/18/2018.  Appears to be rim-enhancing which would favor abscess over malignancy.  Cirrhosis with moderate ascites and esophageal varices.  Assessment & Plan:   In summary, the  patient is a co-morbid 63 year old female anticoagulated with Eliquis for atrial fibrillation, with cirrhosis and ascites currently admitted for abdominal pain and diarrhea with incidental finding on CT scan of retained right ureteral stent since November 2017, with possible perinephric abscess versus mass.  On my review of the CT would favor abscess as opposed to mass with a rim enhancement, however cannot rule out malignancy.  I discussed at length these findings with the patient, and the importance for close follow-up in 2 to 3 weeks for attempted stent removal in clinic.  -Recommend 2-week course of treatment dose Bactrim or Cipro for maximal renal parenchymal penetration in the setting of possible abscess.  Send urine for culture. -Recommend 5-day course of fluconazole for yeast in urine -Will need follow-up with CT abdomen/pelvis with contrast 4 to 6 weeks after stent removal to confirm resolution of perinephric abscess versus mass.  If persistent at that time would recommend biopsy with interventional radiology. -Follow-up in clinic in 2 to 3 weeks for stent removal after completion of  antibiotic course (ordered)  [place], [place] [address], [place], [place] [address] [phone]

## 2018-09-26 LAB — HEPATITIS PANEL, ACUTE
HCV Ab: 0.2 s/co ratio (ref 0.0–0.9)
HEP A IGM: NEGATIVE
Hep B C IgM: NEGATIVE
Hepatitis B Surface Ag: NEGATIVE

## 2018-09-26 LAB — COMPREHENSIVE METABOLIC PANEL
ALT: 15 U/L (ref 0–44)
AST: 22 U/L (ref 15–41)
Albumin: 2.7 g/dL — ABNORMAL LOW (ref 3.5–5.0)
Alkaline Phosphatase: 111 U/L (ref 38–126)
Anion gap: 4 — ABNORMAL LOW (ref 5–15)
BUN: 8 mg/dL (ref 8–23)
CO2: 24 mmol/L (ref 22–32)
Calcium: 8.2 mg/dL — ABNORMAL LOW (ref 8.9–10.3)
Chloride: 108 mmol/L (ref 98–111)
Creatinine, Ser: 0.68 mg/dL (ref 0.44–1.00)
GFR calc Af Amer: >60 mL/min (ref >60)
GFR calc non Af Amer: >60 mL/min (ref >60)
Glucose, Bld: 82 mg/dL (ref 70–99)
Potassium: 4.7 mmol/L (ref 3.5–5.1)
Sodium: 136 mmol/L (ref 135–145)
Total Bilirubin: 1 mg/dL (ref 0.3–1.2)
Total Protein: 6.5 g/dL (ref 6.5–8.1)

## 2018-09-26 LAB — BLOOD CULTURE ID PANEL (REFLEXED)
Acinetobacter baumannii: NOT DETECTED
CANDIDA TROPICALIS: NOT DETECTED
Candida albicans: NOT DETECTED
Candida glabrata: NOT DETECTED
Candida krusei: NOT DETECTED
Candida parapsilosis: NOT DETECTED
Enterobacter cloacae complex: NOT DETECTED
Enterobacteriaceae species: NOT DETECTED
Enterococcus species: NOT DETECTED
Escherichia coli: NOT DETECTED
Haemophilus influenzae: NOT DETECTED
KLEBSIELLA PNEUMONIAE: NOT DETECTED
Klebsiella oxytoca: NOT DETECTED
Listeria monocytogenes: NOT DETECTED
Methicillin resistance: NOT DETECTED
Neisseria meningitidis: NOT DETECTED
Proteus species: NOT DETECTED
Pseudomonas aeruginosa: NOT DETECTED
Serratia marcescens: NOT DETECTED
Staphylococcus aureus (BCID): NOT DETECTED
Staphylococcus species: DETECTED — AB
Streptococcus agalactiae: NOT DETECTED
Streptococcus pneumoniae: NOT DETECTED
Streptococcus pyogenes: NOT DETECTED
Streptococcus species: NOT DETECTED

## 2018-09-26 LAB — GLUCOSE, CAPILLARY
Glucose-Capillary: 122 mg/dL — ABNORMAL HIGH (ref 70–99)
Glucose-Capillary: 61 mg/dL — ABNORMAL LOW (ref 70–99)
Glucose-Capillary: 87 mg/dL (ref 70–99)
Glucose-Capillary: 151 mg/dL — ABNORMAL HIGH (ref 70–99)

## 2018-09-26 LAB — URINE CULTURE

## 2018-09-26 MED ORDER — CIPROFLOXACIN HCL 500 MG PO TABS: 500.0000 mg | ORAL_TABLET | Freq: Two times a day (BID) | ORAL | 0 refills | Status: DC

## 2018-09-26 MED ORDER — FLUCONAZOLE 100 MG PO TABS: 100.0000 mg | ORAL_TABLET | Freq: Every day | ORAL | 0 refills | Status: AC

## 2018-09-26 MED ORDER — ENSURE ENLIVE PO LIQD: 237.0000 mL | Freq: Two times a day (BID) | ORAL | Status: DC

## 2018-09-26 NOTE — Progress Notes (Signed)
Initial Nutrition Assessment  DOCUMENTATION CODES:   Non-severe (moderate) malnutrition in context of chronic illness  INTERVENTION:   - Ensure Enlive po BID, each supplement provides 350 kcal and 20 grams of protein  - Encourage adequate PO intake  - Advance diet as medically appropriate  NUTRITION DIAGNOSIS:   Moderate Malnutrition related to chronic illness (chronic liver cirrhosis) as evidenced by moderate fat depletion, moderate muscle depletion, severe muscle depletion, percent weight loss (18.9% weight loss in 9 months).  GOAL:   Patient will meet greater than or equal to 90% of their needs  MONITOR:   PO intake, Supplement acceptance, Diet advancement, Labs, Weight trends  REASON FOR ASSESSMENT:   Malnutrition Screening Tool    ASSESSMENT:   63 year old female who presented to the ED on 1/16 with abdominal pain, N/V/D. CT scan showing necrotic appearing mass in right peri-nephrotic region extending into psoas muscle. Bedside paracentesis performed in ED with 20 ml of serous fluid removed. PMH significant for uterine cancer, chronic liver cirrhosis, GERD, HTN, T1DM.  Spoke with pt at bedside who reports having a good appetite today. Noted ~50% completed full liquid breakfast tray at bedside.  Pt shares that at home, her appetite has been less than usual due to persistent diarrhea. Pt shares that she can't stand up long due to back pain and therefore makes food at the beginning of the day to eat throughout the day rather than having real meals. Pt may eat macaroni and cheese, salad, chicken sandwich, tuna sandwich, etc.  Pt is amenable to trying Ensure Enlive during admission.  Pt states that she has lost 22 lbs in 5-6 days PTA. Pt reports her UBW as 220-225 lbs and that she lost down to 113 lbs and now weighs "in the 130's." Pt reports that this has happened in the last several months. Pt reports that she weighs herself daily. Pt believes that her weight loss is  related to her cirrhosis that she reports she has had for 2 years but that no one has told her about.  Per weight history in chart, pt with 13.2 kg weight loss since 12/27/17. This is an 18.9% weight loss in 9 months which is significant for timeframe.  Medications reviewed and include: SSI, Protonix, IV Rocephin, IV Flagyl  Labs reviewed. CBG's: 87, 91, 122, 87, 55, 156, 91 x 24 hours  UOP: 1750 ml x 24 hours  NUTRITION - FOCUSED PHYSICAL EXAM:    Most Recent Value  Orbital Region  Moderate depletion  Upper Arm Region  Moderate depletion  Thoracic and Lumbar Region  Moderate depletion  Buccal Region  Moderate depletion  Temple Region  Moderate depletion  Clavicle Bone Region  Moderate depletion  Clavicle and Acromion Bone Region  Severe depletion  Scapular Bone Region  Moderate depletion  Dorsal Hand  Moderate depletion  Patellar Region  Moderate depletion  Anterior Thigh Region  Moderate depletion  Posterior Calf Region  Moderate depletion  Edema (RD Assessment)  None  Hair  Reviewed  Eyes  Reviewed  Mouth  Reviewed  Skin  Reviewed  Nails  Reviewed       Diet Order:   Diet Order            Diet Heart Room service appropriate? Yes; Fluid consistency: Thin  Diet effective now        Diet - low sodium heart healthy              EDUCATION NEEDS:   Education needs have  been addressed  Skin:  Skin Assessment: Reviewed RN Assessment  Last BM:  1/16 (large type 5)  Height:   Ht Readings from Last 1 Encounters:  09/25/18 5' 3"  (1.6 m)    Weight:   Wt Readings from Last 1 Encounters:  09/25/18 56.7 kg    Ideal Body Weight:  52.3 kg  BMI:  Body mass index is 22.14 kg/m.  Estimated Nutritional Needs:   Kcal:  1700-1900  Protein:  80-95 grams  Fluid:  1.7-1.9 L    Gaynell Face, MS, RD, LDN Inpatient Clinical Dietitian Pager: [phone] Weekend/After Hours: [phone]

## 2018-09-26 NOTE — Progress Notes (Signed)
PHARMACY - PHYSICIAN COMMUNICATION CRITICAL VALUE ALERT - BLOOD CULTURE IDENTIFICATION (BCID)  [name] is an 63 y.o. female who presented to [facility] on 09/25/2018 with a chief complaint of abdominal pain  Assessment:  Vitals WNL, PCT -, CT abdomen shows cirrhotic liver w/ perinephric abscess and ureteral stent, 1/4 aerobic GPC BCID staph species Mec A -  Name of physician (or Provider) Contacted: Darel Hong  Current antibiotics: Patient was on 1c w/ rocephin for SBP prophylaxis but has since been discharged  Changes to prescribed antibiotics recommended:  Spoke w/ provider and both came to a consensus that this is likely a contaminant given the clinical scenario; patient also has f/u for her ureteral stent. No call will be given.  Results for orders placed or performed during the hospital encounter of 09/25/18  Blood Culture ID Panel (Reflexed) (Collected: 09/25/2018  6:09 AM)  Result Value Ref Range   Enterococcus species NOT DETECTED NOT DETECTED   Listeria monocytogenes NOT DETECTED NOT DETECTED   Staphylococcus species DETECTED (A) NOT DETECTED   Staphylococcus aureus (BCID) NOT DETECTED NOT DETECTED   Methicillin resistance NOT DETECTED NOT DETECTED   Streptococcus species NOT DETECTED NOT DETECTED   Streptococcus agalactiae NOT DETECTED NOT DETECTED   Streptococcus pneumoniae NOT DETECTED NOT DETECTED   Streptococcus pyogenes NOT DETECTED NOT DETECTED   Acinetobacter baumannii NOT DETECTED NOT DETECTED   Enterobacteriaceae species NOT DETECTED NOT DETECTED   Enterobacter cloacae complex NOT DETECTED NOT DETECTED   Escherichia coli NOT DETECTED NOT DETECTED   Klebsiella oxytoca NOT DETECTED NOT DETECTED   Klebsiella pneumoniae NOT DETECTED NOT DETECTED   Proteus species NOT DETECTED NOT DETECTED   Serratia marcescens NOT DETECTED NOT DETECTED   Haemophilus influenzae NOT DETECTED NOT DETECTED   Neisseria meningitidis NOT DETECTED NOT DETECTED   Pseudomonas  aeruginosa NOT DETECTED NOT DETECTED   Candida albicans NOT DETECTED NOT DETECTED   Candida glabrata NOT DETECTED NOT DETECTED   Candida krusei NOT DETECTED NOT DETECTED   Candida parapsilosis NOT DETECTED NOT DETECTED   Candida tropicalis NOT DETECTED NOT DETECTED    Tobie Lords 09/26/2018  11:58 PM

## 2018-09-26 NOTE — Progress Notes (Signed)
Received MD order to discharge patient to home, reviewed home meds discharge instructions, prescriptions and follow up appointments with patient and patient verbalized understanding

## 2018-09-26 NOTE — Care Management (Signed)
Patient for discharge home today and home health services to be resumed.  Advanced aware and orders for RN and PT home health present.

## 2018-09-28 LAB — BODY FLUID CULTURE
Culture: NO GROWTH
Special Requests: NORMAL

## 2018-09-29 ENCOUNTER — Other Ambulatory Visit: Payer: Self-pay | Admitting: *Deleted

## 2018-09-29 ENCOUNTER — Telehealth: Payer: Self-pay | Admitting: *Deleted

## 2018-09-29 ENCOUNTER — Telehealth: Payer: Self-pay

## 2018-09-29 ENCOUNTER — Ambulatory Visit: Payer: Medicare Other | Admitting: Cardiology

## 2018-09-29 ENCOUNTER — Other Ambulatory Visit: Payer: Self-pay | Admitting: Primary Care

## 2018-09-29 DIAGNOSIS — M542 Cervicalgia: Secondary | ICD-10-CM

## 2018-09-29 DIAGNOSIS — M549 Dorsalgia, unspecified: Principal | ICD-10-CM

## 2018-09-29 DIAGNOSIS — G8929 Other chronic pain: Principal | ICD-10-CM

## 2018-09-29 LAB — CULTURE, BLOOD (ROUTINE X 2)

## 2018-09-29 NOTE — Telephone Encounter (Signed)
Message sent to alternate provider for review, in the absence of PCP

## 2018-09-29 NOTE — Patient Outreach (Signed)
[facility]) Care Management  09/29/2018  [name] 12-Sep-1955 [id number]   EMMI-general discharge RED ON EMMI ALERT Day # 1 Date: Sunday 09/28/2018 [place] Reason: Unfilled prescriptions? Yes Able to fill today/tomorrow? No Insurance: [employer] medicare   [facility] admissions x 3 ED visits x 3 in the last 6 months  Last admission was  09/25/2018 to 09/26/2018 from [facility] attempt # 1 No answer. [facility] RN CM unable to leave a voicemail message along with CM's contact info at [phone] Outreach attempt#1[name] Keachi on 2019 DPR as friend caregiver No answer. [facility] RN CM left HIPAA compliant voicemail message along with CM's contact info.    Transition of care services noted to be completed by primary care MD office staff [name] at [place] NP  Transition of Care will be completed by primary care provider office who will refer to [facility] care management if needed.   Conditions: uncontrolled type 2 diabetes, atrial fibrillation, cryptogenic cirrhosis, history of CVA with residual L-facial droop, history of NICM, chronic opioid use, COPD, HDL,  drug-induced hypothyroidism   Appointments: 10/06/2018 Alma Friendly NP  [place]  2/102020 cysto with brian sninsky, [facility] 10/27/2018 new pt with Dr Jenne Campus [facility] [place]   Plan: [facility] RN CM sent an unsuccessful outreach letter and scheduled this patient for another call attempt within 4 business days  [place] L. Lavina Hamman, RN, BSN, [place] Coordinator Office number [phone] Mobile number [phone]  Main [facility] number [phone] Fax number [phone]

## 2018-09-29 NOTE — Telephone Encounter (Signed)
Spoke to [name] with [facility] who states pt was recently d/c and they are requesting orders to continue nursing and PT. She is also requesting to add social work to her current orders and reports pts BP was 154/100 sitting and 154/94 standing. pls advise

## 2018-09-29 NOTE — Telephone Encounter (Signed)
Agree, please give the order.  I also routed this to PCP for input and FYI tomorrow. Thanks.

## 2018-09-29 NOTE — Telephone Encounter (Signed)
Noted  

## 2018-09-29 NOTE — Telephone Encounter (Signed)
Noted and agree. Has she seen her cardiologist? Does she have one? If not then I want to get her connected. Let me know.

## 2018-09-29 NOTE — Telephone Encounter (Signed)
Transitional Care Management Follow-up Telephone Call    Date discharged? 09/26/2018  How have you been since you were released from the hospital? [place]. Recurring pain in mid upper part of abdomen. Diet concerns. Not consuming solid foods at this time. Drinking coffee. Poor sleep pattern. Sleeps 1-1.5 hours per night. Constipation. No bowel movement in previous 7 days.   Items Reviewed:  Medications reviewed: Yes  Allergies reviewed: Yes  Dietary changes reviewed: Yes  Referrals reviewed: Yes   Functional Questionnaire:  Independent - I Dependent - D    Activities of Daily Living (ADLs):    Personal hygiene - I Dressing - I Eating - I Maintaining continence - I Transferring - I   Independent Activities of Daily Living (iADLs): Basic communication skills - I Transportation - I Meal preparation  - I Shopping - I Housework - I Managing medications - I  Managing personal finances - I   Confirmed importance and date/time of follow-up visits scheduled YES  Provider Appointment booked with PCP 10/06/18 @ 1400  Confirmed with patient if condition begins to worsen call PCP or go to the ER.  Patient was given the office number and encouraged to call back with question or concerns: YES

## 2018-09-30 ENCOUNTER — Telehealth: Payer: Self-pay | Admitting: *Deleted

## 2018-09-30 ENCOUNTER — Ambulatory Visit: Payer: Self-pay | Admitting: *Deleted

## 2018-09-30 DIAGNOSIS — N151 Renal and perinephric abscess: Secondary | ICD-10-CM

## 2018-09-30 LAB — CULTURE, BLOOD (ROUTINE X 2): Culture: NO GROWTH

## 2018-09-30 MED ORDER — SULFAMETHOXAZOLE-TRIMETHOPRIM 800-160 MG PO TABS: 1.0000 | ORAL_TABLET | Freq: Two times a day (BID) | ORAL | 0 refills | Status: DC

## 2018-09-30 NOTE — Telephone Encounter (Signed)
Sharee Pimple with [facility] left vm at triage indicating pt was recently given a Cipro abx. Pt has been Dx with cirrhosis and the pharmacy indicates that pt should not be on cipro. Sharee Pimple is requesting a call back to confirm if ok for pt to take or does she need an alt Rx.pls advise.

## 2018-09-30 NOTE — Telephone Encounter (Signed)
Patient was discharged home with ciprofloxacin antibiotic from her recent hospital stay.  Reviewed urology consult note who is recommending either oral Bactrim or Cipro for potential ureteral stent abscess.    Will change prescription from Cipro to Bactrim.  Will you ask Sharee Pimple if Bactrim poses the same problem in the setting of cirrhosis?  It is a better alternative?  If this is a better alternative than I will send a new prescription to her pharmacy.  Patient will need to be notified of this change, will pharmacy notify?

## 2018-09-30 NOTE — Telephone Encounter (Signed)
We will send refill until we can discuss further at her upcoming visit.  Given her history of recurrent falls this is not an ideal medication.

## 2018-09-30 NOTE — Telephone Encounter (Signed)
Spoken and notified Sharee Pimple of [name] Clark's comments. She stated that she does not know if Bactrim poses the same problem. She was told by the patient regarding the Rx from the pharmacy and Sharee Pimple is a physical therapist. . Please send Rx to [facility] pharmacy.  Also wanted [name] know that patient sugar was in the 300s earlier today but patient didn't have her insulin until mid-afternoon.

## 2018-09-30 NOTE — Telephone Encounter (Signed)
Last prescribed on 08/28/2018. Per pharmacy this request for next refill. Last office visit on  09/24/2018. Next future appointment on 10/06/2018

## 2018-09-30 NOTE — Telephone Encounter (Signed)
Gave the approval for verbal orders. Also patient has appointment with Dr Agustin Cree (cardioloist) on 10/27/2018

## 2018-09-30 NOTE — Discharge Summary (Signed)
[place] [facility] [place] NAME: [name]    MR#:  [id number]  DATE OF BIRTH:  06/02/1956  DATE OF ADMISSION:  09/25/2018   ADMITTING PHYSICIAN: Gorden Harms, MD  DATE OF DISCHARGE: 09/26/2018  5:23 PM  PRIMARY CARE PHYSICIAN: [name], NP   ADMISSION DIAGNOSIS:   Dehydration [E86.0] Hypokalemia [E87.6] Lactic acidosis [E87.2] Enteritis [K52.9] Generalized abdominal pain [R10.84]  DISCHARGE DIAGNOSIS:   Active Problems:   Abdominal pain   SECONDARY DIAGNOSIS:   Past Medical History:  Diagnosis Date  . Acute encephalopathy 05/22/2018  . Allergy   . Anxiety   . Ascites   . C. difficile colitis 07/10/2015  . Cancer (HCC)    HX OF CANCER OF UTERUS   . Cirrhosis of liver not due to alcohol ([place]) 2016  . Degenerative disk disease   . Diverticulitis   . Gastroparesis   . GERD (gastroesophageal reflux disease)   . History of hiatal hernia   . Hypertension   . Hypothyroid   . Hypothyroidism due to amiodarone   . Ileus ([place]) 08/01/2015  . Intussusception intestine ([place]) 05/2015  . Orthostatic hypotension   . PAF (paroxysmal atrial fibrillation) ([place]) 03/2015   a. new onset 03/2015 in setting of intractable N/V; b. on Eliquis 5 mg bid; c. CHADSVASc 4 (DM, TIA x 2, female)  . Pancreatitis   . Pneumonia 11/14/2015  . Right ureteral stone 07/14/2016  . Sick sinus syndrome ([place])   . Stomach ulcer   . Stroke [facility])    with minimal left sided weakness  . Syncope 01/2015  . Syncope due to orthostatic hypotension 05/18/2015  . Tachyarrhythmia 01/10/2016  . TIA (transient ischemic attack) 02/2015  . Type 1 diabetes ([place])    on levemir  . UTERINE CANCER, HX OF 03/27/2007   Qualifier: Diagnosis of  By: Maxie Better FNP, Rosalita Levan   . UTI (urinary tract infection) 05/22/2018    HOSPITAL COURSE:   63 year old female with past medical history significant for alcohol use, liver cirrhosis, history of uterine cancer, paroxysmal  A. fib on Eliquis, hypertension, hypothyroidism, type 1 diabetes mellitus presents to hospital secondary to diffuse abdominal pain  1.  Abdominal pain- likely from ascites as diffuse and generalized. -That is post ultrasound-guided paracentesis and almost 2 L fluid taken out.  No infection identified. -Patient received IV Rocephin in the hospital and is being discharged on ciprofloxacin. -Also CT of the abdomen showing a right renal mass versus abscess.  Seen by urologist.  2.  Right renal mass/abscess-appreciate urology consult. Patient has a retained right ureteral stent and possible perinephric abscess versus mass.  - Urology recommended 2 weeks of antibiotics and outpatient follow-up for procedure-including stent removal.. -Urine analysis showing few yeast and culture were nonsignificant. -Diflucan for yeast and oral antibiotics at discharge. -Outpatient stent removal and follow-up CT as outpatient for resolution of the abscess versus mass.  3.  Alcoholic liver cirrhosis-with ascites.   -Counseled against alcohol use at discharge.  4. Type 1 Diabetes mellitus with hyperglycemia-patient on Levemir at home.  We will continue that  5.  Paroxysmal A. fib-continue metoprolol for rate control.  Also on Eliquis for anticoagulation  Patient has been up and ambulatory.  Will be discharged home today    DISCHARGE CONDITIONS:   Guarded  CONSULTS OBTAINED:   Treatment Team:  Billey Co, MD  DRUG ALLERGIES:   Allergies  Allergen Reactions  . Hydrocodone Other (See Comments)  Pt states that this medication caused cirrhosis of the liver.    . Aspirin   . Erythromycin Other (See Comments)    Reaction:  Fever   . Prednisone Other (See Comments)    Reaction:  Unknown   . Rosiglitazone Maleate Swelling  . Codeine Sulfate Rash  . Tetanus-Diphtheria Toxoids Td Rash and Other (See Comments)    Reaction:  Fever    DISCHARGE MEDICATIONS:   Allergies as of 09/26/2018       Reactions   Hydrocodone Other (See Comments)   Pt states that this medication caused cirrhosis of the liver.     Aspirin    Erythromycin Other (See Comments)   Reaction:  Fever    Prednisone Other (See Comments)   Reaction:  Unknown    Rosiglitazone Maleate Swelling   Codeine Sulfate Rash   Tetanus-diphtheria Toxoids Td Rash, Other (See Comments)   Reaction:  Fever       Medication List    TAKE these medications   albuterol 108 (90 Base) MCG/ACT inhaler Commonly known as:  PROVENTIL HFA;VENTOLIN HFA Inhale 2 puffs into the lungs every 6 (six) hours as needed for wheezing or shortness of breath.   apixaban 5 MG Tabs tablet Commonly known as:  ELIQUIS Take 5 mg by mouth 2 (two) times daily. What changed:  Another medication with the same name was removed. Continue taking this medication, and follow the directions you see here.   atorvastatin 40 MG tablet Commonly known as:  LIPITOR Take 40 mg by mouth daily. What changed:  Another medication with the same name was removed. Continue taking this medication, and follow the directions you see here.   ciprofloxacin 500 MG tablet Commonly known as:  CIPRO Take 1 tablet (500 mg total) by mouth 2 (two) times daily for 12 days.   CONTOUR BLOOD GLUCOSE SYSTEM w/Device Kit Use to test blood sugar 3 times daily. Dx is E11.65   cyclobenzaprine 10 MG tablet Commonly known as:  FLEXERIL Take 1 tablet (10 mg total) by mouth 3 (three) times daily as needed for muscle spasms.   fluconazole 100 MG tablet Commonly known as:  DIFLUCAN Take 1 tablet (100 mg total) by mouth daily for 7 days.   glucose blood test strip Contour Test Strips. Use as instructed to test blood sugar 3 times daily.   Insulin Detemir 100 UNIT/ML Pen Commonly known as:  LEVEMIR FLEXPEN Inject 15 Units into the skin 2 (two) times daily.   Lancet Device Misc Use as instructed to test blood sugar 3 times daily. Dx is E11.65   metoprolol tartrate 25 MG  tablet Commonly known as:  LOPRESSOR Take 0.5 tablets (12.5 mg total) by mouth 2 (two) times daily. For blood pressure. What changed:    how much to take  additional instructions        DISCHARGE INSTRUCTIONS:   1.  PCP follow-up in 1 to 2 weeks  DIET:   Cardiac diet  ACTIVITY:   Activity as tolerated  OXYGEN:   Home Oxygen: No.  Oxygen Delivery: room air  DISCHARGE LOCATION:   home   If you experience worsening of your admission symptoms, develop shortness of breath, life threatening emergency, suicidal or homicidal thoughts you must seek medical attention immediately by calling 911 or calling your MD immediately  if symptoms less severe.  You Must read complete instructions/literature along with all the possible adverse reactions/side effects for all the Medicines you take and that have been prescribed to  you. Take any new Medicines after you have completely understood and accpet all the possible adverse reactions/side effects.   Please note  You were cared for by a hospitalist during your hospital stay. If you have any questions about your discharge medications or the care you received while you were in the hospital after you are discharged, you can call the unit and asked to speak with the hospitalist on call if the hospitalist that took care of you is not available. Once you are discharged, your primary care physician will handle any further medical issues. Please note that NO REFILLS for any discharge medications will be authorized once you are discharged, as it is imperative that you return to your primary care physician (or establish a relationship with a primary care physician if you do not have one) for your aftercare needs so that they can reassess your need for medications and monitor your lab values.    On the day of Discharge:  VITAL SIGNS:   Blood pressure 137/70, pulse (!) 110, temperature (!) 97.3 F (36.3 C), temperature source Oral, resp. rate 16,  height _0  (1.6 m), weight 56.7 kg, SpO2 99 %.  PHYSICAL EXAMINATION:   GENERAL:  63 y.o.-year-old patient lying in the bed with no acute distress.  Appears chronically ill EYES: Pupils equal, round, reactive to light and accommodation. No scleral icterus. Extraocular muscles intact.  HEENT: Head atraumatic, normocephalic. Oropharynx and nasopharynx clear.  NECK:  Supple, no jugular venous distention. No thyroid enlargement, no tenderness.  LUNGS: Normal breath sounds bilaterally, no wheezing, rales,rhonchi or crepitation. No use of accessory muscles of respiration.  Decreased bibasilar breath sounds  CARDIOVASCULAR: S1, S2 normal. No  rubs, or gallops.  2/6 systolic murmur is present ABDOMEN: Soft, distended, non tender, no guarding or rigidity noted.  normal Bowel sounds present. No organomegaly or mass.  EXTREMITIES: No pedal edema, cyanosis, or clubbing.  NEUROLOGIC: Cranial nerves II through XII are intact. Muscle strength 5/5 in all extremities. Sensation intact. Gait not checked.  PSYCHIATRIC: The patient is alert and oriented x 3.  SKIN: No obvious rash, lesion, or ulcer.   DATA REVIEW:   CBC Recent Labs  Lab 09/25/18 0410 09/25/18 0419  WBC 7.0  --   HGB 10.5* 11.6*  HCT 34.1* 34.0*  PLT 189  --     Chemistries  Recent Labs  Lab 09/25/18 0410  09/26/18 0539  NA 137   < > 136  K 2.8*   < > 4.7  CL 107   < > 108  CO2 21*  --  24  GLUCOSE 95   < > 82  BUN 16   < > 8  CREATININE 0.79   < > 0.68  CALCIUM 8.4*  --  8.2*  MG 1.9  --   --   AST 34  --  22  ALT 18  --  15  ALKPHOS 115  --  111  BILITOT 1.0  --  1.0   < > = values in this interval not displayed.     Microbiology Results  Results for orders placed or performed during the hospital encounter of 09/25/18  Blood Culture (routine x 2)     Status: None   Collection Time: 09/25/18  5:22 AM  Result Value Ref Range Status   Specimen Description BLOOD RIGHT WRIST  Final   Special Requests   Final     BOTTLES DRAWN AEROBIC AND ANAEROBIC Blood Culture results may not be optimal  due to an inadequate volume of blood received in culture bottles   Culture   Final    NO GROWTH 5 DAYS Performed at [facility], [address] [place], [place] [address]    Report Status 09/30/2018 FINAL  Final  Blood Culture (routine x 2)     Status: Abnormal   Collection Time: 09/25/18  6:09 AM  Result Value Ref Range Status   Specimen Description   Final    BLOOD LEFT ANTECUBITAL Performed at [facility], [place] [place], [place] [address]    Special Requests   Final    BOTTLES DRAWN AEROBIC AND ANAEROBIC Blood Culture results may not be optimal due to an excessive volume of blood received in culture bottles Performed at [facility], [address] [place], [place] [address]    Culture  Setup Time   Final    GRAM POSITIVE COCCI AEROBIC BOTTLE ONLY CRITICAL RESULT CALLED TO, READ BACK BY AND VERIFIED WITH: DAVID BESANTI ON 09/26/2018 AT 2244 QSD    Culture (A)  Final    STAPHYLOCOCCUS SPECIES (COAGULASE NEGATIVE) THE SIGNIFICANCE OF ISOLATING THIS ORGANISM FROM A SINGLE SET OF BLOOD CULTURES WHEN MULTIPLE SETS ARE DRAWN IS UNCERTAIN. PLEASE NOTIFY THE MICROBIOLOGY DEPARTMENT WITHIN ONE WEEK IF SPECIATION AND SENSITIVITIES ARE REQUIRED. Performed at [facility], [place] [address] [place], [place] [address]    Report Status 09/29/2018 FINAL  Final  Body fluid culture     Status: None   Collection Time: 09/25/18  6:09 AM  Result Value Ref Range Status   Specimen Description   Final    PERITONEAL Performed at [facility], [address], [place], [place] [address]    Special Requests   Final    Normal Performed at [facility], [address] [place], [place] [address]    Gram Stain   Final    WBC PRESENT,BOTH PMN AND MONONUCLEAR NO ORGANISMS SEEN CYTOSPIN SMEAR    Culture   Final    NO GROWTH 3 DAYS Performed at [facility], [place] [address] [place],  [address]    Report Status 09/28/2018 FINAL  Final  Blood Culture ID Panel (Reflexed)     Status: Abnormal   Collection Time: 09/25/18  6:09 AM  Result Value Ref Range Status   Enterococcus species NOT DETECTED NOT DETECTED Final   Listeria monocytogenes NOT DETECTED NOT DETECTED Final   Staphylococcus species DETECTED (A) NOT DETECTED Final    Comment: Methicillin (oxacillin) susceptible coagulase negative staphylococcus. Possible blood culture contaminant (unless isolated from more than one blood culture draw or clinical case suggests pathogenicity). No antibiotic treatment is indicated for blood  culture contaminants. CRITICAL RESULT CALLED TO, READ BACK BY AND VERIFIED WITH: DAVID BESANTI ON 09/26/2018 AT 2244 QSD    Staphylococcus aureus (BCID) NOT DETECTED NOT DETECTED Final   Methicillin resistance NOT DETECTED NOT DETECTED Final   Streptococcus species NOT DETECTED NOT DETECTED Final   Streptococcus agalactiae NOT DETECTED NOT DETECTED Final   Streptococcus pneumoniae NOT DETECTED NOT DETECTED Final   Streptococcus pyogenes NOT DETECTED NOT DETECTED Final   Acinetobacter baumannii NOT DETECTED NOT DETECTED Final   Enterobacteriaceae species NOT DETECTED NOT DETECTED Final   Enterobacter cloacae complex NOT DETECTED NOT DETECTED Final   Escherichia coli NOT DETECTED NOT DETECTED Final   Klebsiella oxytoca NOT DETECTED NOT DETECTED Final   Klebsiella pneumoniae NOT DETECTED NOT DETECTED Final   Proteus species  NOT DETECTED NOT DETECTED Final   Serratia marcescens NOT DETECTED NOT DETECTED Final   Haemophilus influenzae NOT DETECTED NOT DETECTED Final   Neisseria meningitidis NOT DETECTED NOT DETECTED Final   Pseudomonas aeruginosa NOT DETECTED NOT DETECTED Final   Candida albicans NOT DETECTED NOT DETECTED Final   Candida glabrata NOT DETECTED NOT DETECTED Final   Candida krusei NOT DETECTED NOT DETECTED Final   Candida parapsilosis NOT  DETECTED NOT DETECTED Final   Candida tropicalis NOT DETECTED NOT DETECTED Final    Comment: Performed at [facility], [address] [place], [place] [address]  Urine Culture     Status: Abnormal   Collection Time: 09/25/18  3:30 PM  Result Value Ref Range Status   Specimen Description   Final    URINE, RANDOM Performed at [facility], [address] [place], [place] [address]    Special Requests   Final    NONE Performed at [facility], [address] [place], [place] [address]    Culture (A)  Final    <10,000 COLONIES/mL INSIGNIFICANT GROWTH Performed at [facility] [address], [place], [place] [address]    Report Status 09/26/2018 FINAL  Final    RADIOLOGY:  No results found.   Management plans discussed with the patient, family and they are in agreement.  CODE STATUS:  Code Status History    Date Active Date Inactive Code Status Order ID Comments User Context   09/25/2018 0922 09/26/2018 2028 Full Code [id number]  Gorden Harms, MD Inpatient   08/18/2018 2234 08/20/2018 1923 DNR [id number]  Rise Patience, MD ED   08/18/2018 2136 08/18/2018 2234 Full Code [id number]  Rise Patience, MD ED   05/22/2018 1101 05/24/2018 1850 Full Code [id number]  Molt, [place], DO ED   12/27/2017 1245 12/30/2017 2040 Full Code [id number]  Demetrios Loll, MD Inpatient   12/15/2017 0501 12/17/2017 1854 Full Code [id number]  Harrie Foreman, MD Inpatient   08/06/2016 2036 08/09/2016 1945 Full Code [id number]  Vaughan Basta, MD ED   07/14/2016 0426 07/17/2016 1934 Full Code [id number]  Harrie Foreman, MD Inpatient   07/09/2016 0453 07/10/2016 1708 Full Code [id number]  Lance Coon, MD Inpatient   06/17/2016 2245 06/20/2016 1509 Full Code [id number]  Lance Coon, MD Inpatient   06/02/2016 0546 06/06/2016 1821 DNR [id number]  Harrie Foreman, MD Inpatient   05/12/2016 1143 05/16/2016 1638 DNR [id number]  Dustin Flock, MD Inpatient   05/12/2016  0113 05/12/2016 1143 Full Code [id number]  [place], Ubaldo Glassing, DO Inpatient   04/07/2016 0618 04/09/2016 1419 DNR [id number]  Harrie Foreman, MD Inpatient   03/13/2016 2125 03/16/2016 1832 DNR [id number]  Lance Coon, MD Inpatient   02/04/2016 0939 02/07/2016 1459 DNR [id number]  Saundra Shelling, MD Inpatient   01/21/2016 2224 01/25/2016 1702 DNR [id number]  Lance Coon, MD Inpatient   01/15/2016 1339 01/19/2016 1529 DNR [id number]  Gladstone Lighter, MD ED   01/10/2016 2200 01/13/2016 1724 DNR [id number]  Harrie Foreman, MD Inpatient   12/27/2015 2302 01/01/2016 1817 DNR [id number]  Max Sane, MD Inpatient   12/16/2015 1654 12/19/2015 1652 DNR [id number]  Loletha Grayer, MD ED   11/11/2015 1136 11/16/2015 1405 Full Code [id number]  Samella Parr, NP Inpatient   10/15/2015 0123 10/16/2015 1817 Full Code [id number]  Lance Coon, MD Inpatient   10/04/2015 2019 10/08/2015 1834 Full Code [id number]  Bethena Roys, MD Inpatient   08/15/2015 0105 08/16/2015 2004 Full Code [id number]  Marcille Blanco,  Norva Riffle, MD Inpatient   08/01/2015 0125 08/06/2015 1741 Full Code [id number]  Harrie Foreman, MD Inpatient   07/04/2015 0253 07/11/2015 1833 Full Code [id number]  Lavetta Nielsen Aaron Mose, MD ED   05/27/2015 2248 05/31/2015 1437 Full Code [id number]  Rise Patience, MD Inpatient   05/19/2015 0114 05/22/2015 1628 Full Code [id number]  Rise Patience, MD Inpatient   04/05/2015 0916 04/07/2015 1723 Full Code [id number]  Bettey Costa, MD Inpatient   04/03/2015 1231 04/05/2015 0916 Full Code [id number]  Bettey Costa, MD Inpatient   03/11/2015 1649 03/14/2015 [place] Full Code [id number]  Dustin Flock, MD Inpatient   02/15/2015 1651 02/17/2015 1433 DNR [id number]  Dustin Flock, MD Inpatient   02/15/2015 1635 02/15/2015 1651 Full Code [id number]  Dustin Flock, MD Inpatient   01/11/2015 0240 01/12/2015 1811 Full Code [id number]  Etta Quill, DO ED      TOTAL TIME TAKING CARE OF THIS PATIENT: 38 minutes.    Gladstone Lighter M.D on 09/30/2018 at 9:04  AM  Between 7am to 6pm - Pager - [phone]  After 6pm go to [email or web address] - Proofreader  [facility]  Office  [phone]  CC: Primary care physician; [name], NP   Note: This dictation was prepared with Dragon dictation along with smaller phrase technology. Any transcriptional errors that result from this process are unintentional.

## 2018-09-30 NOTE — Telephone Encounter (Signed)
Noted, new Rx sent to pharmacy. I'm assuming that the patient hasn't picked up the Cipro prescription, correct? We need to make sure that she knows not to take Cipro and to take sulfamethazole-trimethoprim (Bactrim) instead.

## 2018-09-30 NOTE — Telephone Encounter (Signed)
Noted  

## 2018-10-01 ENCOUNTER — Ambulatory Visit: Payer: Self-pay | Admitting: *Deleted

## 2018-10-01 NOTE — Telephone Encounter (Signed)
Called the pharmacy and patient already pick up the Cipro. I have called patient and inform not to take the Cipro and to go to the pharmacy and pick up Bactrim. Patient is aware.

## 2018-10-02 ENCOUNTER — Other Ambulatory Visit: Payer: Self-pay | Admitting: *Deleted

## 2018-10-02 NOTE — Patient Outreach (Signed)
[place] [facility]) Care Management  10/02/2018  [name] 1955/09/23 [id number]   EMMI-general discharge RED ON EMMI ALERT Day # 1 Date: Sunday 09/28/2018 [place] Reason: Unfilled prescriptions? Yes Able to fill today/tomorrow? No Insurance: [employer] medicare   [facility] admissions x 3 ED visits x 3 in the last 6 months  Last admission was  09/25/2018 to 09/26/2018 from [facility] attempt # 2 successful to her home number [phone]  EMMI - [name] reports the answers to the EMMI question was incorrect She report she has filled all her prescriptions She reports [facility] has visited today She reports having her primary MD visit upcoming on Monday 10/06/2018 She reports she is to go to the [place] office on 10/03/2018  She states she is doing well and denies any medical needs [facility] RN CM could assist with at this time  Transition of care services noted to be completed by primary care MD office staff [place] [facility] [place] NP  Transition of Care will be completed by primary care provider office who will refer to [facility] care management if needed.  Social [name] lives with her husband and has support of there children and family She is independent to assist with care needs and transportation from family members   Conditions: uncontrolled type 2 diabetes, atrial fibrillation, cryptogenic cirrhosis, history of CVA with residual L-facial droop, history of NICM, chronic opioid use, COPD, HDL,  drug-induced hypothyroidism   Appointments: 10/06/2018 Alma Friendly NP  [place]  2/102020 cysto with brian sninsky, [facility] 10/27/2018 new pt with Dr Jenne Campus [facility] [place]   Plan: [facility] RN CM will close case at this time as patient has been assessed and no needs identified/needs resolved.   Pt encouraged to return a call to [facility] RN CM prn   Disha Cottam L. Lavina Hamman, RN, BSN, [place] Coordinator Office number [phone] Mobile number [phone]  Main [facility] number [phone] Fax number [phone]

## 2018-10-03 ENCOUNTER — Ambulatory Visit: Payer: Medicare Other | Admitting: Primary Care

## 2018-10-06 ENCOUNTER — Ambulatory Visit (INDEPENDENT_AMBULATORY_CARE_PROVIDER_SITE_OTHER): Payer: Medicare Other | Admitting: Primary Care

## 2018-10-06 ENCOUNTER — Encounter: Payer: Self-pay | Admitting: Primary Care

## 2018-10-06 DIAGNOSIS — K7469 Other cirrhosis of liver: Secondary | ICD-10-CM

## 2018-10-06 DIAGNOSIS — I214 Non-ST elevation (NSTEMI) myocardial infarction: Secondary | ICD-10-CM

## 2018-10-06 DIAGNOSIS — N2889 Other specified disorders of kidney and ureter: Secondary | ICD-10-CM | POA: Insufficient documentation

## 2018-10-06 DIAGNOSIS — E1165 Type 2 diabetes mellitus with hyperglycemia: Secondary | ICD-10-CM

## 2018-10-06 DIAGNOSIS — I10 Essential (primary) hypertension: Secondary | ICD-10-CM | POA: Diagnosis not present

## 2018-10-06 LAB — MICROALBUMIN / CREATININE URINE RATIO
Creatinine,U: 58.6 mg/dL
MICROALB/CREAT RATIO: 27.7 mg/g (ref 0.0–30.0)
Microalb, Ur: 16.2 mg/dL — ABNORMAL HIGH (ref 0.0–1.9)

## 2018-10-06 NOTE — Assessment & Plan Note (Signed)
To right peri-nephrotic space, unchanged from December 2019. Will be following up with Urology in mid February for ureteral stent removal and repeat CT abdomen/pelvis

## 2018-10-06 NOTE — Patient Instructions (Signed)
Stop by the lab prior to leaving today. I will notify you of your results once received.   Continue the antibiotics as prescribed.   Monitor your blood sugars and record them on the sheet as discussed. You must wait 2 hours after eating in order to get more accurate blood sugar readings.  You will be contacted regarding your referral to the eye doctor.  Please let us know if you have not been contacted within one week.   Follow up with Urology and Cardiology as scheduled.  Please schedule a follow up appointment in 1 month for diabetes check. Bring your sugar logs please.  It was a pleasure to see you today!

## 2018-10-06 NOTE — Assessment & Plan Note (Addendum)
Checking glucose right after meals which is not helpful.  Given that we don't have any good glucose readings we will have to continue Levemir at 15 units BID. Foot exam today. Pneumonia vaccination UTD. Urine microalbumin due and pending. Managed on statin. Referral for optometry placed for eye exam.  Follow up in 1 month with glucose logs.

## 2018-10-06 NOTE — Assessment & Plan Note (Signed)
Has an appointment with cardiology in late February.

## 2018-10-06 NOTE — Assessment & Plan Note (Signed)
Admitted for abdominal distention with paracentesis x 2. She does not drink alcohol, is compliant to antibiotics.  Improved overall.  Consider GI evaluation if symptoms reoccur.  Follow up with Urology as scheduled.  All hospital notes, labs, imaging reviewed.

## 2018-10-06 NOTE — Assessment & Plan Note (Signed)
Stable in the office today, continue to monitor.

## 2018-10-06 NOTE — Progress Notes (Signed)
Subjective:    Patient ID: [name], female    DOB: [birth year], 63 y.o.   MRN: [id number]  HPI  [name] is a 63 year old female who presents today for [facility] Follow Up and Diabetes follow up.  1) TCM Hospital Follow Up:  She presented to our office on 09/24/18 with complaints of diarrhea, decrease in appetite, weight loss, abdominal pain and distension. Her symptoms seemed severe and her examination was suspicious for acute abdominal cause so she was sent to the emergency department for further evaluation. She did go to the ED that day but left before being seen due to wait times.  She returned again to the ED on 09/25/18 via EMS with continued abdominal symptoms. During her stay in the ED her CT abdomen/pelvis showed "necrotic appearing mass" to right peri-nephrotic region that was concerning for abscess vs malignancy. She underwent paracentesis with removal of 20 cc of serous fluid. She was treated with IV antibiotics and fluids. She was admitted for further treatment given new mass, elevated lactic acidosis, and persistent abdominal pain.  During her hospital stay she was consulted by Urology for uretal stent and possible right peri nephrotic mass vs abscess. It was recommended she follow up in the outpatient setting for removal of stent and follow up CT abdomen/pelvis for mass/abscess evaluation. She underwent another ultrasound guided paracentesis with nearly 2 liters of fluid extracted. She was discharged home on 09/26/18 with oral ciprofloxacin and outpatient follow up recommendations.   Since her hospital stay we received a call from pharmacy reporting that she should not be taking ciprofloxacin due to chronic liver disease. Her prescription was changed by myself to Bactrim DS.  She continues to have some abdominal discomfort but is overall better. She has an appointment with Urology for ureteral stent removal. She is compliant to her Bactrim DS antibiotics.   She is  visited by the home health nurse twice weekly, also home PT twice weekly.  2) Type 2 Diabetes:  Current medications include: Levemir 15 units BID  She is checking her blood glucose  times daily and is getting readings of:  After breakfast (30 minutes) low 300's After lunch (30 minutes) 200-300's After dinner (30 minutes) 200-300's  Last A1C: 14.6 in December 2019 Last Eye Exam: No recent exam Last Foot Exam: Due today Pneumonia Vaccination: Completed in 2016 ACE/ARB: None, urine microalbumin due Statin: atorvastatin   Diet currently consists of:  Breakfast: Oatmeal, egg Lunch: Burritos Dinner: Pizza, sandwich, bacon Snacks: Muffin, chips, crackers, cheese  Desserts: None Beverages: Coffee, water, soda    Review of Systems  Constitutional: Negative for fever.  Respiratory: Negative for shortness of breath.   Cardiovascular: Negative for chest pain.  Gastrointestinal: Positive for abdominal pain. Negative for constipation and diarrhea.  Skin: Negative for color change.       Past Medical History:  Diagnosis Date  . Acute encephalopathy 05/22/2018  . Allergy   . Anxiety   . Ascites   . C. difficile colitis 07/10/2015  . Cancer (HCC)    HX OF CANCER OF UTERUS   . Cirrhosis of liver not due to alcohol ([place]) 2016  . Degenerative disk disease   . Diverticulitis   . Gastroparesis   . GERD (gastroesophageal reflux disease)   . History of hiatal hernia   . Hypertension   . Hypothyroid   . Hypothyroidism due to amiodarone   . Ileus ([place]) 08/01/2015  . Intussusception intestine ([place]) 05/2015  . Orthostatic  hypotension   . PAF (paroxysmal atrial fibrillation) ([place]) 03/2015   a. new onset 03/2015 in setting of intractable N/V; b. on Eliquis 5 mg bid; c. CHADSVASc 4 (DM, TIA x 2, female)  . Pancreatitis   . Pneumonia 11/14/2015  . Right ureteral stone 07/14/2016  . Sick sinus syndrome ([place])   . Stomach ulcer   . Stroke [facility])    with minimal left sided weakness  .  Syncope 01/2015  . Syncope due to orthostatic hypotension 05/18/2015  . Tachyarrhythmia 01/10/2016  . TIA (transient ischemic attack) 02/2015  . Type 1 diabetes ([place])    on levemir  . UTERINE CANCER, HX OF 03/27/2007   Qualifier: Diagnosis of  By: Maxie Better FNP, Rosalita Levan   . UTI (urinary tract infection) 05/22/2018     Social History   Socioeconomic History  . Marital status: Married    Spouse name: Not on file  . Number of children: Not on file  . Years of education: Not on file  . Highest education level: Not on file  Occupational History  . Occupation: Disabled 2nd back problems  Social Needs  . Financial resource strain: Not on file  . Food insecurity:    Worry: Not on file    Inability: Not on file  . Transportation needs:    Medical: Not on file    Non-medical: Not on file  Tobacco Use  . Smoking status: Former Smoker    Types: Cigarettes  . Smokeless tobacco: Never Used  . Tobacco comment: 25 years ago and only smoked occasionally  Substance and Sexual Activity  . Alcohol use: No  . Drug use: No  . Sexual activity: Not Currently  Lifestyle  . Physical activity:    Days per week: Not on file    Minutes per session: Not on file  . Stress: Not on file  Relationships  . Social connections:    Talks on phone: Not on file    Gets together: Not on file    Attends religious service: Not on file    Active member of club or organization: Not on file    Attends meetings of clubs or organizations: Not on file    Relationship status: Not on file  . Intimate partner violence:    Fear of current or ex partner: Not on file    Emotionally abused: Not on file    Physically abused: Not on file    Forced sexual activity: Not on file  Other Topics Concern  . Not on file  Social History Narrative   Lives in [place], [place] with her husband and 2 sons.    Past Surgical History:  Procedure Laterality Date  . ABDOMINAL HYSTERECTOMY    . CARDIAC CATHETERIZATION N/A 01/12/2016     Procedure: Left Heart Cath and Coronary Angiography;  Surgeon: Wellington Hampshire, MD;  Location: [facility];  Service: Cardiovascular;  Laterality: N/A;  . CHOLECYSTECTOMY    . CYSTOSCOPY/URETEROSCOPY/HOLMIUM LASER Right 07/14/2016   Procedure: CYSTOSCOPY/URETEROSCOPY/HOLMIUM LASER;  Surgeon: Alexis Frock, MD;  Location: [facility] ORS;  Service: Urology;  Laterality: Right;  . ESOPHAGOGASTRODUODENOSCOPY N/A 04/04/2015   Procedure: ESOPHAGOGASTRODUODENOSCOPY (EGD);  Surgeon: Hulen Luster, MD;  Location[facility] ENDOSCOPY;  Service: Endoscopy;  Laterality: N/A;  . ESOPHAGOGASTRODUODENOSCOPY N/A 12/28/2017   Procedure: ESOPHAGOGASTRODUODENOSCOPY (EGD);  Surgeon: Lin Landsman, MD;  Location: [facility] ENDOSCOPY;  Service: Gastroenterology;  Laterality: N/A;  . ESOPHAGOGASTRODUODENOSCOPY (EGD) WITH PROPOFOL N/A 01/18/2016   Procedure: ESOPHAGOGASTRODUODENOSCOPY (EGD) WITH PROPOFOL;  Surgeon: Lucilla Lame, MD;  Location: [facility] ENDOSCOPY;  Service: Endoscopy;  Laterality: N/A;  . FLEXIBLE SIGMOIDOSCOPY N/A 01/18/2016   Procedure: FLEXIBLE SIGMOIDOSCOPY;  Surgeon: Lucilla Lame, MD;  Location: [facility] ENDOSCOPY;  Service: Endoscopy;  Laterality: N/A;  . HERNIA REPAIR      Family History  Problem Relation Age of Onset  . Hypertension Mother   . CAD Sister   . Heart attack Sister        Deceased 2014-11-09  . CAD Brother     Allergies  Allergen Reactions  . Hydrocodone Other (See Comments)    Pt states that this medication caused cirrhosis of the liver.    . Aspirin   . Erythromycin Other (See Comments)    Reaction:  Fever   . Prednisone Other (See Comments)    Reaction:  Unknown   . Rosiglitazone Maleate Swelling  . Codeine Sulfate Rash  . Tetanus-Diphtheria Toxoids Td Rash and Other (See Comments)    Reaction:  Fever     Current Outpatient Medications on File Prior to Visit  Medication Sig Dispense Refill  . apixaban (ELIQUIS) 5 MG TABS tablet Take 5 mg by mouth 2 (two) times daily.    Marland Kitchen  atorvastatin (LIPITOR) 40 MG tablet Take 40 mg by mouth daily.    . Blood Glucose Monitoring Suppl (CONTOUR BLOOD GLUCOSE SYSTEM) w/Device KIT Use to test blood sugar 3 times daily. Dx is E11.65 1 each 0  . cyclobenzaprine (FLEXERIL) 10 MG tablet TAKE ONE TABLET 3 TIMES DAILY AS NEEDED FOR MUSCLE SPASM 30 tablet 0  . glucose blood test strip Contour Test Strips. Use as instructed to test blood sugar 3 times daily. 100 each 2  . Insulin Detemir (LEVEMIR FLEXPEN) 100 UNIT/ML Pen Inject 15 Units into the skin 2 (two) times daily. 15 mL 2  . Lancet Device MISC Use as instructed to test blood sugar 3 times daily. Dx is E11.65 100 each 2  . sulfamethoxazole-trimethoprim (BACTRIM DS,SEPTRA DS) 800-160 MG tablet Take 1 tablet by mouth 2 (two) times daily. 28 tablet 0  . metoprolol tartrate (LOPRESSOR) 25 MG tablet Take 0.5 tablets (12.5 mg total) by mouth 2 (two) times daily. For blood pressure. (Patient taking differently: Take 25 mg by mouth 2 (two) times daily. ) 90 tablet 0   No current facility-administered medications on file prior to visit.     BP 124/80   Pulse (!) 108   Temp 98 F (36.7 C) (Oral)   Ht 5' 3" (1.6 m)   Wt 133 lb (60.3 kg)   SpO2 95%   BMI 23.56 kg/m    Objective:   Physical Exam  Constitutional: She appears well-nourished.  Cardiovascular: Normal rate and regular rhythm.  Respiratory: Effort normal and breath sounds normal.  GI: Soft. Bowel sounds are normal.  Mild distention to abdomen without pain.   Skin: Skin is warm and dry.           Assessment & Plan:

## 2018-10-07 ENCOUNTER — Telehealth: Payer: Self-pay

## 2018-10-07 NOTE — Telephone Encounter (Signed)
[name] nurse with [facility] left v/m wanting to know if the first BS in AM should be fasting or do you want that BS also 2 hr after eating. [name] request cb with clarification.

## 2018-10-07 NOTE — Telephone Encounter (Signed)
Please have [name] reference the blood glucose log sheet that I sent home with patient yesterday. She should be checking 2-3 times daily rotating checks. The patient should be filling out the glucose log sheet that I sent home. Does she have the sheet?  If not then can check fasting before any meal, 2 hours after any meal, bedtime. Needs to record blood glucose readings.

## 2018-10-07 NOTE — Telephone Encounter (Signed)
Spoken and notified [name] with [facility] of [name] comments. Patient verbalized understanding.

## 2018-10-14 ENCOUNTER — Telehealth: Payer: Self-pay | Admitting: *Deleted

## 2018-10-14 DIAGNOSIS — E1122 Type 2 diabetes mellitus with diabetic chronic kidney disease: Secondary | ICD-10-CM | POA: Diagnosis not present

## 2018-10-14 DIAGNOSIS — Z79899 Other long term (current) drug therapy: Secondary | ICD-10-CM | POA: Diagnosis not present

## 2018-10-14 DIAGNOSIS — Z7901 Long term (current) use of anticoagulants: Secondary | ICD-10-CM | POA: Diagnosis not present

## 2018-10-14 DIAGNOSIS — I129 Hypertensive chronic kidney disease with stage 1 through stage 4 chronic kidney disease, or unspecified chronic kidney disease: Secondary | ICD-10-CM | POA: Diagnosis not present

## 2018-10-14 DIAGNOSIS — I480 Paroxysmal atrial fibrillation: Secondary | ICD-10-CM | POA: Diagnosis not present

## 2018-10-14 DIAGNOSIS — N183 Chronic kidney disease, stage 3 (moderate): Secondary | ICD-10-CM | POA: Diagnosis not present

## 2018-10-14 DIAGNOSIS — J449 Chronic obstructive pulmonary disease, unspecified: Secondary | ICD-10-CM | POA: Diagnosis not present

## 2018-10-14 DIAGNOSIS — E1143 Type 2 diabetes mellitus with diabetic autonomic (poly)neuropathy: Secondary | ICD-10-CM | POA: Diagnosis not present

## 2018-10-14 DIAGNOSIS — Z9181 History of falling: Secondary | ICD-10-CM | POA: Diagnosis not present

## 2018-10-14 DIAGNOSIS — I69354 Hemiplegia and hemiparesis following cerebral infarction affecting left non-dominant side: Secondary | ICD-10-CM | POA: Diagnosis not present

## 2018-10-14 DIAGNOSIS — G894 Chronic pain syndrome: Secondary | ICD-10-CM | POA: Diagnosis not present

## 2018-10-14 DIAGNOSIS — I951 Orthostatic hypotension: Secondary | ICD-10-CM | POA: Diagnosis not present

## 2018-10-14 DIAGNOSIS — Z794 Long term (current) use of insulin: Secondary | ICD-10-CM | POA: Diagnosis not present

## 2018-10-14 NOTE — Telephone Encounter (Signed)
Spoke to [place], [facility] who states she saw pt and spent 1.5hrs for DM teaching. She reports that her BS 438 fasting this am and states she last ate yesterday appx 4-5pm. She is not currently on any oral DM medications but takes 15units of levemir BID. She wanted to send to PCP as Juluis Rainier and is requesting a call back with any additional orders/ request. pls advise

## 2018-10-14 NOTE — Telephone Encounter (Signed)
Would recommend she provide me with additional readings at different times. She needs to continue to record her readings at least twice daily, rotating times of checks. What other readings do we have? I gave her a sheet to record readings on during her last visit.

## 2018-10-15 NOTE — Telephone Encounter (Signed)
Message left for [name] to return my call.

## 2018-10-16 NOTE — Telephone Encounter (Signed)
Spoken to [name] earlier today of Kate's comments.

## 2018-10-20 ENCOUNTER — Encounter: Payer: Self-pay | Admitting: Urology

## 2018-10-20 ENCOUNTER — Other Ambulatory Visit: Payer: Commercial Managed Care - HMO | Admitting: Urology

## 2018-10-20 ENCOUNTER — Telehealth: Payer: Self-pay | Admitting: Urology

## 2018-10-20 NOTE — Telephone Encounter (Signed)
Please attempt to re-schedule stent removal.  Nickolas Madrid, MD 10/20/2018

## 2018-10-20 NOTE — Telephone Encounter (Signed)
Just F.Y.I. your cysto/stent removal was a no show today

## 2018-10-21 ENCOUNTER — Telehealth: Payer: Self-pay | Admitting: Primary Care

## 2018-10-21 DIAGNOSIS — G894 Chronic pain syndrome: Secondary | ICD-10-CM

## 2018-10-21 NOTE — Telephone Encounter (Signed)
Noted.  Referral placed.

## 2018-10-21 NOTE — Telephone Encounter (Signed)
Referral was placed for the patient to see [facility] but she declined the appt when they called her. She wants to go now and the Pain [facility] needs a New Referral placed. Please put New Pain Referral in for the patient.

## 2018-10-22 NOTE — Telephone Encounter (Signed)
Called patient to resched She was unavailable, asked if they could have her call back. They said they did not have transportation.   Sharyn Lull

## 2018-10-24 ENCOUNTER — Ambulatory Visit: Payer: Medicare Other | Admitting: Primary Care

## 2018-10-27 ENCOUNTER — Ambulatory Visit: Payer: Medicare Other | Admitting: Cardiology

## 2018-10-31 ENCOUNTER — Other Ambulatory Visit: Payer: Self-pay | Admitting: Primary Care

## 2018-10-31 DIAGNOSIS — M542 Cervicalgia: Secondary | ICD-10-CM

## 2018-10-31 DIAGNOSIS — M549 Dorsalgia, unspecified: Principal | ICD-10-CM

## 2018-10-31 DIAGNOSIS — G8929 Other chronic pain: Principal | ICD-10-CM

## 2018-11-04 ENCOUNTER — Telehealth: Payer: Self-pay | Admitting: Primary Care

## 2018-11-04 DIAGNOSIS — E1165 Type 2 diabetes mellitus with hyperglycemia: Secondary | ICD-10-CM

## 2018-11-04 MED ORDER — INSULIN GLARGINE 100 UNIT/ML SOLOSTAR PEN: 15.0000 [IU] | PEN_INJECTOR | Freq: Every day | SUBCUTANEOUS | 5 refills | Status: DC

## 2018-11-04 NOTE — Telephone Encounter (Signed)
Looked over my faxes and did not see anything regarding this patient. I have started a prior auth for patient and send it to the insurance today.  Patient was notified of this. Will have to wait for a response from insurance if they approve or not.

## 2018-11-04 NOTE — Telephone Encounter (Signed)
Pt has no more Levemir and request to speak with [facility] CMA. Vallarie Mare is not at her desk (no answer on phone) and will send note for Vallarie Mare to call pt. Pt last received levemir on 11/03/18 in AM.

## 2018-11-04 NOTE — Telephone Encounter (Signed)
Lady from [employer] [place] left a voicemail stating that they have denied the PA for Levemir flex touch. Message was that patient has not tried and failed two other medications on her drug list which are Lantus and Toujeo.

## 2018-11-04 NOTE — Telephone Encounter (Signed)
Please call for additional information to process medication request for Levemir FlexTouch 100 unit solution   [employer] stated they fax over request 2.21.20 but no response from our office. Please call to advise.

## 2018-11-04 NOTE — Telephone Encounter (Signed)
Please notify patient that we can switch from Levemir to Lantus.  Inject 15 units of Lantus once nightly, I will send this to [facility].  I will need to see her a scheduled next week. Make sure she picks up the Lantus and starts 15 units HS. Remind her to bring her glucose logs to her visit on March 2nd.

## 2018-11-04 NOTE — Telephone Encounter (Signed)
Spoken and notified patient of [name]'s comments. Patient verbalized understanding.  

## 2018-11-05 ENCOUNTER — Telehealth: Payer: Self-pay | Admitting: *Deleted

## 2018-11-05 NOTE — Telephone Encounter (Signed)
Noted. We will need an update tomorrow regarding her insulin status. If she is not going to get insulin within 24 hours then we will need to proceed with OTC insulin from [facility]. Please update me on 11/06/18. Have patient continue to monitor glucose as discussed, using the glucose logs I provided. Make sure to drink plenty of water.

## 2018-11-05 NOTE — Telephone Encounter (Addendum)
Jerrye Beavers with [facility] called stating that the patient has been out of her insulin for several days and her blood sugar has been running over 400. Jerrye Beavers stated that she has been working on trying to figure out why patient was not able to get the new script for lantus that you sent in for her.Marland KitchenMarland KitchenJerrye Beavers stated that she has found out that something was coded wrong in their system and they are having to complete some paperwork in order for patient to be able to get her medication for $3  Instead of $300. Jerrye Beavers stated that the person that she talked to at their company stated that it can take 24-48 hours to get this cleared up. Jerrye Beavers was calling wanting to know if our office has any samples that patient can be given. Susann Givens that our office does not have samples. Jerrye Beavers stated that she wanted to let [name] NP know what is going on and she is staying on top of the matter to try and get this cleared up quickly.

## 2018-11-06 NOTE — Telephone Encounter (Addendum)
Spoken to [facility] and they are still processing the correction for patient. Should be complete soon.  I have called patient to check up on her. Patient stated that she just received a delivery of Lantus this morning.

## 2018-11-06 NOTE — Telephone Encounter (Signed)
Message left for [name] to return my call.

## 2018-11-06 NOTE — Telephone Encounter (Signed)
Noted, we will see her as scheduled next week.

## 2018-11-07 ENCOUNTER — Telehealth: Payer: Self-pay | Admitting: Primary Care

## 2018-11-07 NOTE — Telephone Encounter (Signed)
Pt's husband is supposed to call office back to let us know if they can change their appointments to Tuesday at 11:20 and 11:40.

## 2018-11-10 ENCOUNTER — Ambulatory Visit: Payer: Medicare Other | Admitting: Primary Care

## 2018-11-10 ENCOUNTER — Ambulatory Visit: Payer: Self-pay | Admitting: Primary Care

## 2018-11-10 NOTE — Telephone Encounter (Signed)
She called in c/o her blood sugars running high.  Yesterday at breakfast it was 400, at lunch it was 489 and at bedtime last night it was 376.   This morning it's 389. Denies any of symptoms except frequent urination.  She had an appt today with [name] however due to transportation issues she can not make it in.  I will send a high priority note to [name],  NP and someone from the office will be calling you back.   You are agreeable to this plan.  I sent the triage notes to the office.  Reason for Disposition . [1] Blood glucose > 300 mg/dL (16.7 mmol/L) AND [2] two or more times in a row  Answer Assessment - Initial Assessment Questions 1. BLOOD GLUCOSE: "What is your blood glucose level?"      389 2. ONSET: "When did you check the blood glucose?"     6:30 am this morning. 3. USUAL RANGE: "What is your glucose level usually?" (e.g., usual fasting morning value, usual evening value)     It's been up and down a lot lately.   I've not had a regular blood sugar for a while.   They changed my medication to Lantus 15 units at bedtime. 4. KETONES: "Do you check for ketones (urine or blood test strips)?" If yes, ask: "What does the test show now?"      No 5. TYPE 1 or 2:  "Do you know what type of diabetes you have?"  (e.g., Type 1, Type 2, Gestational; doesn't know)      Type I.    6. INSULIN: "Do you take insulin?" "What type of insulin(s) do you use? What is the mode of delivery? (syringe, pen (e.g., injection or  pump)?"      Yes   Lantus 15 units at bedtime. 7. DIABETES PILLS: "Do you take any pills for your diabetes?" If yes, ask: "Have you missed taking any pills recently?"     No 8. OTHER SYMPTOMS: "Do you have any symptoms?" (e.g., fever, frequent urination, difficulty breathing, dizziness, weakness, vomiting)     Urinating a lot.   9. PREGNANCY: "Is there any chance you are pregnant?" "When was your last menstrual period?"     Not asked due to age.  Protocols used: DIABETES  - HIGH BLOOD SUGAR-A-AH

## 2018-11-10 NOTE — Telephone Encounter (Signed)
Spoken and notified patient of [name] comments. Yes, patient did start the Lantus on Friday and will increase to 18 units as instructed. Schedule follow up on 11/17/2018.

## 2018-11-10 NOTE — Telephone Encounter (Signed)
Noted  

## 2018-11-10 NOTE — Telephone Encounter (Signed)
Did she start her Lantus insulin? When did she start? How much is she injecting? If she started her insulin Friday last week then have her increase to 18 units nightly. We will need to see her in the office with her glucose logs when she's able to return. I'd like to see her in one week with glucose logs. Let me know.

## 2018-11-17 ENCOUNTER — Ambulatory Visit (INDEPENDENT_AMBULATORY_CARE_PROVIDER_SITE_OTHER): Payer: Medicare Other | Admitting: Primary Care

## 2018-11-17 ENCOUNTER — Encounter: Payer: Self-pay | Admitting: Primary Care

## 2018-11-17 VITALS — BP 124/84 | HR 113 | Temp 98.0°F | Ht 63.0 in | Wt 130.2 lb

## 2018-11-17 DIAGNOSIS — E1165 Type 2 diabetes mellitus with hyperglycemia: Secondary | ICD-10-CM

## 2018-11-17 DIAGNOSIS — I10 Essential (primary) hypertension: Secondary | ICD-10-CM

## 2018-11-17 DIAGNOSIS — I480 Paroxysmal atrial fibrillation: Secondary | ICD-10-CM

## 2018-11-17 DIAGNOSIS — I495 Sick sinus syndrome: Secondary | ICD-10-CM

## 2018-11-17 LAB — POCT GLYCOSYLATED HEMOGLOBIN (HGB A1C): Hemoglobin A1C: 9.4 % — AB (ref 4.0–5.6)

## 2018-11-17 MED ORDER — INSULIN GLARGINE 100 UNIT/ML SOLOSTAR PEN: 20.0000 [IU] | PEN_INJECTOR | Freq: Every day | SUBCUTANEOUS | 5 refills | Status: DC

## 2018-11-17 MED ORDER — METOPROLOL TARTRATE 25 MG PO TABS: 12.5000 mg | ORAL_TABLET | Freq: Two times a day (BID) | ORAL | 3 refills | Status: DC

## 2018-11-17 NOTE — Assessment & Plan Note (Signed)
Out of metoprolol tartrate for one week, refills sent to pharmacy.

## 2018-11-17 NOTE — Assessment & Plan Note (Signed)
Repeat A1C pending. Glucose readings uncontrolled. Increase Lantus to 20 units to start. We will plan to see her back in 3 weeks with glucose logs and titrate insulin as needed. She does have a history of hypoglycemia and skipping meals, discussed this with patient. This is why we will slowly titrate insulin.

## 2018-11-17 NOTE — Assessment & Plan Note (Signed)
Tachycardic in the office today, refilled metoprolol tartrate.

## 2018-11-17 NOTE — Progress Notes (Signed)
Subjective:    Patient ID: [name], female    DOB: Jul 28, 1956, 63 y.o.   MRN: [id number]  HPI  [name] is a 63 year old female who presents today for follow up. She is also needing refills of her metoprolol tartrate, ran out one week ago.   BP Readings from Last 3 Encounters:  11/17/18 124/84  10/06/18 124/80  09/26/18 137/70    Current medications include: Lantus 15 units HS. She resumed her insulin 2 weeks ago as she had difficulty with insurance coverage. She was out for 10 days.  She is checking her blood glucose 3 times daily and is getting readings of: AM fasting: 350-399 Before lunch: 300 Bedtime: mid 400's  This morning 429, fasting.   Last A1C: 14.6 in December 2019 Last Eye Exam: Last Foot Exam: Completed in January 2020 Pneumonia Vaccination: Completed in 2016 ACE/ARB: Urine microalbumin negative in 2020 Statin: atorvastatin   Diet currently consists of:  Breakfast: Egg, cereal  Lunch: TV dinner, [place] sandwich, soup Dinner: TV dinner, [place] sandwich, soup Snacks: Cheese crackers, cereal  Desserts: 3 days weekly  Beverages: Flavored water, regular soda, occasional sweet tea, little alcohol  Exercise: She is not exercising   Review of Systems  Eyes: Negative for visual disturbance.  Respiratory: Negative for shortness of breath.   Cardiovascular: Negative for chest pain.  Neurological: Negative for dizziness and headaches.       Past Medical History:  Diagnosis Date  . Acute encephalopathy 05/22/2018  . Allergy   . Anxiety   . Ascites   . C. difficile colitis 07/10/2015  . Cancer (HCC)    HX OF CANCER OF UTERUS   . Cirrhosis of liver not due to alcohol ([place]) 2016  . Degenerative disk disease   . Diverticulitis   . Gastroparesis   . GERD (gastroesophageal reflux disease)   . History of hiatal hernia   . Hypertension   . Hypothyroid   . Hypothyroidism due to amiodarone   . Ileus ([place]) 08/01/2015  . Intussusception intestine  ([place]) 05/2015  . Orthostatic hypotension   . PAF (paroxysmal atrial fibrillation) ([place]) 03/2015   a. new onset 03/2015 in setting of intractable N/V; b. on Eliquis 5 mg bid; c. CHADSVASc 4 (DM, TIA x 2, female)  . Pancreatitis   . Pneumonia 11/14/2015  . Right ureteral stone 07/14/2016  . Sick sinus syndrome ([place])   . Stomach ulcer   . Stroke [facility])    with minimal left sided weakness  . Syncope 01/2015  . Syncope due to orthostatic hypotension 05/18/2015  . Tachyarrhythmia 01/10/2016  . TIA (transient ischemic attack) 02/2015  . Type 1 diabetes ([place])    on levemir  . UTERINE CANCER, HX OF 03/27/2007   Qualifier: Diagnosis of  By: Maxie Better FNP, Rosalita Levan   . UTI (urinary tract infection) 05/22/2018     Social History   Socioeconomic History  . Marital status: Married    Spouse name: Not on file  . Number of children: Not on file  . Years of education: Not on file  . Highest education level: Not on file  Occupational History  . Occupation: Disabled 2nd back problems  Social Needs  . Financial resource strain: Not on file  . Food insecurity:    Worry: Not on file    Inability: Not on file  . Transportation needs:    Medical: Not on file    Non-medical: Not on file  Tobacco Use  .  Smoking status: Former Smoker    Types: Cigarettes  . Smokeless tobacco: Never Used  . Tobacco comment: 25 years ago and only smoked occasionally  Substance and Sexual Activity  . Alcohol use: No  . Drug use: No  . Sexual activity: Not Currently  Lifestyle  . Physical activity:    Days per week: Not on file    Minutes per session: Not on file  . Stress: Not on file  Relationships  . Social connections:    Talks on phone: Not on file    Gets together: Not on file    Attends religious service: Not on file    Active member of club or organization: Not on file    Attends meetings of clubs or organizations: Not on file    Relationship status: Not on file  . Intimate partner violence:    Fear  of current or ex partner: Not on file    Emotionally abused: Not on file    Physically abused: Not on file    Forced sexual activity: Not on file  Other Topics Concern  . Not on file  Social History Narrative   Lives in [place], [place] with her husband and 2 sons.    Past Surgical History:  Procedure Laterality Date  . ABDOMINAL HYSTERECTOMY    . CARDIAC CATHETERIZATION N/A 01/12/2016   Procedure: Left Heart Cath and Coronary Angiography;  Surgeon: Wellington Hampshire, MD;  Location: [facility];  Service: Cardiovascular;  Laterality: N/A;  . CHOLECYSTECTOMY    . CYSTOSCOPY/URETEROSCOPY/HOLMIUM LASER Right 07/14/2016   Procedure: CYSTOSCOPY/URETEROSCOPY/HOLMIUM LASER;  Surgeon: Alexis Frock, MD;  Location: [facility] ORS;  Service: Urology;  Laterality: Right;  . ESOPHAGOGASTRODUODENOSCOPY N/A 04/04/2015   Procedure: ESOPHAGOGASTRODUODENOSCOPY (EGD);  Surgeon: Hulen Luster, MD;  Location: [facility] ENDOSCOPY;  Service: Endoscopy;  Laterality: N/A;  . ESOPHAGOGASTRODUODENOSCOPY N/A 12/28/2017   Procedure: ESOPHAGOGASTRODUODENOSCOPY (EGD);  Surgeon: Lin Landsman, MD;  Location: [facility] ENDOSCOPY;  Service: Gastroenterology;  Laterality: N/A;  . ESOPHAGOGASTRODUODENOSCOPY (EGD) WITH PROPOFOL N/A 01/18/2016   Procedure: ESOPHAGOGASTRODUODENOSCOPY (EGD) WITH PROPOFOL;  Surgeon: Lucilla Lame, MD;  Location: [facility] ENDOSCOPY;  Service: Endoscopy;  Laterality: N/A;  . FLEXIBLE SIGMOIDOSCOPY N/A 01/18/2016   Procedure: FLEXIBLE SIGMOIDOSCOPY;  Surgeon: Lucilla Lame, MD;  Location: [facility] ENDOSCOPY;  Service: Endoscopy;  Laterality: N/A;  . HERNIA REPAIR      Family History  Problem Relation Age of Onset  . Hypertension Mother   . CAD Sister   . Heart attack Sister        Deceased 11/13/2014  . CAD Brother     Allergies  Allergen Reactions  . Hydrocodone Other (See Comments)    Pt states that this medication caused cirrhosis of the liver.    . Aspirin   . Erythromycin Other (See Comments)    Reaction:   Fever   . Prednisone Other (See Comments)    Reaction:  Unknown   . Rosiglitazone Maleate Swelling  . Codeine Sulfate Rash  . Tetanus-Diphtheria Toxoids Td Rash and Other (See Comments)    Reaction:  Fever     Current Outpatient Medications on File Prior to Visit  Medication Sig Dispense Refill  . apixaban (ELIQUIS) 5 MG TABS tablet Take 5 mg by mouth 2 (two) times daily.    Marland Kitchen atorvastatin (LIPITOR) 40 MG tablet Take 40 mg by mouth daily.    . Blood Glucose Monitoring Suppl (CONTOUR BLOOD GLUCOSE SYSTEM) w/Device KIT Use to test blood sugar 3 times daily. Dx  is E11.65 1 each 0  . cyclobenzaprine (FLEXERIL) 10 MG tablet TAKE 1 TABLET BY MOUTH THREE TIMES DAILYAS NEEDED FOR MUSCLE SPASM 90 tablet 0  . glucose blood test strip Contour Test Strips. Use as instructed to test blood sugar 3 times daily. 100 each 2  . Lancet Device MISC Use as instructed to test blood sugar 3 times daily. Dx is E11.65 100 each 2   No current facility-administered medications on file prior to visit.     BP 124/84   Pulse (!) 113   Temp 98 F (36.7 C) (Oral)   Ht _0  (1.6 m)   Wt 130 lb 4 oz (59.1 kg)   SpO2 99%   BMI 23.07 kg/m    Objective:   Physical Exam  Constitutional: She is oriented to person, place, and time. She appears well-nourished.  Neck: Neck supple.  Cardiovascular: Normal rate and regular rhythm.  Respiratory: Effort normal and breath sounds normal.  Neurological: She is alert and oriented to person, place, and time.  Skin: Skin is warm and dry.  Psychiatric: She has a normal mood and affect.           Assessment & Plan:

## 2018-11-17 NOTE — Patient Instructions (Addendum)
Stop by the lab prior to leaving today. I will notify you of your results once received.   Increase your Lantus to 20 units at bedtime. Continue to monitor your blood sugar readings as discussed.   Resume metoprolol tartrate. Take 1/2 tablet twice daily for heart rate control.  Schedule a follow up visit for 3 weeks for diabetes check, bring your glucose logs.  It was a pleasure to see you today!   Diabetes Mellitus and Nutrition, Adult When you have diabetes (diabetes mellitus), it is very important to have healthy eating habits because your blood sugar (glucose) levels are greatly affected by what you eat and drink. Eating healthy foods in the appropriate amounts, at about the same times every day, can help you:  Control your blood glucose.  Lower your risk of heart disease.  Improve your blood pressure.  Reach or maintain a healthy weight. Every person with diabetes is different, and each person has different needs for a meal plan. Your health care provider may recommend that you work with a diet and nutrition specialist (dietitian) to make a meal plan that is best for you. Your meal plan may vary depending on factors such as:  The calories you need.  The medicines you take.  Your weight.  Your blood glucose, blood pressure, and cholesterol levels.  Your activity level.  Other health conditions you have, such as heart or kidney disease. How do carbohydrates affect me? Carbohydrates, also called carbs, affect your blood glucose level more than any other type of food. Eating carbs naturally raises the amount of glucose in your blood. Carb counting is a method for keeping track of how many carbs you eat. Counting carbs is important to keep your blood glucose at a healthy level, especially if you use insulin or take certain oral diabetes medicines. It is important to know how many carbs you can safely have in each meal. This is different for every person. Your dietitian can help  you calculate how many carbs you should have at each meal and for each snack. Foods that contain carbs include:  Bread, cereal, rice, pasta, and crackers.  Potatoes and corn.  Peas, beans, and lentils.  Milk and yogurt.  Fruit and juice.  Desserts, such as cakes, cookies, ice cream, and candy. How does alcohol affect me? Alcohol can cause a sudden decrease in blood glucose (hypoglycemia), especially if you use insulin or take certain oral diabetes medicines. Hypoglycemia can be a life-threatening condition. Symptoms of hypoglycemia (sleepiness, dizziness, and confusion) are similar to symptoms of having too much alcohol. If your health care provider says that alcohol is safe for you, follow these guidelines:  Limit alcohol intake to no more than 1 drink per day for nonpregnant women and 2 drinks per day for men. One drink equals 12 oz of beer, 5 oz of wine, or 1 oz of hard liquor.  Do not drink on an empty stomach.  Keep yourself hydrated with water, diet soda, or unsweetened iced tea.  Keep in mind that regular soda, juice, and other mixers may contain a lot of sugar and must be counted as carbs. What are tips for following this plan?  Reading food labels  Start by checking the serving size on the "Nutrition Facts" label of packaged foods and drinks. The amount of calories, carbs, fats, and other nutrients listed on the label is based on one serving of the item. Many items contain more than one serving per package.  Check the total  grams (g) of carbs in one serving. You can calculate the number of servings of carbs in one serving by dividing the total carbs by 15. For example, if a food has 30 g of total carbs, it would be equal to 2 servings of carbs.  Check the number of grams (g) of saturated and trans fats in one serving. Choose foods that have low or no amount of these fats.  Check the number of milligrams (mg) of salt (sodium) in one serving. Most people should limit total  sodium intake to less than 2,300 mg per day.  Always check the nutrition information of foods labeled as "low-fat" or "nonfat". These foods may be higher in added sugar or refined carbs and should be avoided.  Talk to your dietitian to identify your daily goals for nutrients listed on the label. Shopping  Avoid buying canned, premade, or processed foods. These foods tend to be high in fat, sodium, and added sugar.  Shop around the outside edge of the grocery store. This includes fresh fruits and vegetables, bulk grains, fresh meats, and fresh dairy. Cooking  Use low-heat cooking methods, such as baking, instead of high-heat cooking methods like deep frying.  Cook using healthy oils, such as olive, canola, or sunflower oil.  Avoid cooking with butter, cream, or high-fat meats. Meal planning  Eat meals and snacks regularly, preferably at the same times every day. Avoid going long periods of time without eating.  Eat foods high in fiber, such as fresh fruits, vegetables, beans, and whole grains. Talk to your dietitian about how many servings of carbs you can eat at each meal.  Eat 4-6 ounces (oz) of lean protein each day, such as lean meat, chicken, fish, eggs, or tofu. One oz of lean protein is equal to: ? 1 oz of meat, chicken, or fish. ? 1 egg. ?  cup of tofu.  Eat some foods each day that contain healthy fats, such as avocado, nuts, seeds, and fish. Lifestyle  Check your blood glucose regularly.  Exercise regularly as told by your health care provider. This may include: ? 150 minutes of moderate-intensity or vigorous-intensity exercise each week. This could be brisk walking, biking, or water aerobics. ? Stretching and doing strength exercises, such as yoga or weightlifting, at least 2 times a week.  Take medicines as told by your health care provider.  Do not use any products that contain nicotine or tobacco, such as cigarettes and e-cigarettes. If you need help quitting, ask  your health care provider.  Work with a Social worker or diabetes educator to identify strategies to manage stress and any emotional and social challenges. Questions to ask a health care provider  Do I need to meet with a diabetes educator?  Do I need to meet with a dietitian?  What number can I call if I have questions?  When are the best times to check my blood glucose? Where to find more information:  American Diabetes Association: diabetes.org  Academy of Nutrition and Dietetics: www.eatright.[employer] of Diabetes and Digestive and Kidney Diseases (NIH): [employer] Summary  A healthy meal plan will help you control your blood glucose and maintain a healthy lifestyle.  Working with a diet and nutrition specialist (dietitian) can help you make a meal plan that is best for you.  Keep in mind that carbohydrates (carbs) and alcohol have immediate effects on your blood glucose levels. It is important to count carbs and to use alcohol carefully. This information is not  intended to replace advice given to you by your health care provider. Make sure you discuss any questions you have with your health care provider. Document Released: 05/24/2005 Document Revised: 03/27/2017 Document Reviewed: 10/01/2016 Elsevier Interactive Patient Education  2019 [employer].

## 2018-11-25 ENCOUNTER — Other Ambulatory Visit: Payer: Self-pay

## 2018-11-25 ENCOUNTER — Telehealth: Payer: Self-pay

## 2018-11-25 ENCOUNTER — Ambulatory Visit (HOSPITAL_BASED_OUTPATIENT_CLINIC_OR_DEPARTMENT_OTHER): Payer: Medicare Other | Admitting: Nurse Practitioner

## 2018-11-25 ENCOUNTER — Other Ambulatory Visit
Admission: RE | Admit: 2018-11-25 | Discharge: 2018-11-25 | Disposition: A | Discharge status: Home / Self Care | Payer: Medicare Other | Source: Ambulatory Visit | Attending: Nurse Practitioner | Admitting: Nurse Practitioner

## 2018-11-25 ENCOUNTER — Encounter: Payer: Self-pay | Admitting: Nurse Practitioner

## 2018-11-25 VITALS — BP 109/73 | HR 124 | Temp 98.3°F | Resp 16 | Ht 63.0 in | Wt 130.0 lb

## 2018-11-25 DIAGNOSIS — M5441 Lumbago with sciatica, right side: Secondary | ICD-10-CM | POA: Insufficient documentation

## 2018-11-25 DIAGNOSIS — Z79899 Other long term (current) drug therapy: Secondary | ICD-10-CM | POA: Insufficient documentation

## 2018-11-25 DIAGNOSIS — M5442 Lumbago with sciatica, left side: Secondary | ICD-10-CM

## 2018-11-25 DIAGNOSIS — G894 Chronic pain syndrome: Secondary | ICD-10-CM | POA: Insufficient documentation

## 2018-11-25 DIAGNOSIS — Z8673 Personal history of transient ischemic attack (TIA), and cerebral infarction without residual deficits: Secondary | ICD-10-CM | POA: Diagnosis not present

## 2018-11-25 DIAGNOSIS — E118 Type 2 diabetes mellitus with unspecified complications: Secondary | ICD-10-CM | POA: Insufficient documentation

## 2018-11-25 DIAGNOSIS — M542 Cervicalgia: Secondary | ICD-10-CM | POA: Diagnosis not present

## 2018-11-25 DIAGNOSIS — I420 Dilated cardiomyopathy: Secondary | ICD-10-CM | POA: Diagnosis not present

## 2018-11-25 DIAGNOSIS — G8929 Other chronic pain: Secondary | ICD-10-CM | POA: Insufficient documentation

## 2018-11-25 DIAGNOSIS — M549 Dorsalgia, unspecified: Secondary | ICD-10-CM | POA: Diagnosis not present

## 2018-11-25 DIAGNOSIS — M79604 Pain in right leg: Secondary | ICD-10-CM

## 2018-11-25 DIAGNOSIS — E1110 Type 2 diabetes mellitus with ketoacidosis without coma: Secondary | ICD-10-CM | POA: Diagnosis not present

## 2018-11-25 DIAGNOSIS — M7918 Myalgia, other site: Secondary | ICD-10-CM | POA: Insufficient documentation

## 2018-11-25 DIAGNOSIS — M79605 Pain in left leg: Secondary | ICD-10-CM | POA: Insufficient documentation

## 2018-11-25 DIAGNOSIS — M899 Disorder of bone, unspecified: Secondary | ICD-10-CM | POA: Insufficient documentation

## 2018-11-25 DIAGNOSIS — I10 Essential (primary) hypertension: Secondary | ICD-10-CM | POA: Diagnosis not present

## 2018-11-25 DIAGNOSIS — M5126 Other intervertebral disc displacement, lumbar region: Secondary | ICD-10-CM | POA: Diagnosis not present

## 2018-11-25 DIAGNOSIS — I252 Old myocardial infarction: Secondary | ICD-10-CM | POA: Insufficient documentation

## 2018-11-25 DIAGNOSIS — I495 Sick sinus syndrome: Secondary | ICD-10-CM | POA: Diagnosis not present

## 2018-11-25 DIAGNOSIS — Z794 Long term (current) use of insulin: Secondary | ICD-10-CM | POA: Diagnosis not present

## 2018-11-25 DIAGNOSIS — Z789 Other specified health status: Secondary | ICD-10-CM

## 2018-11-25 DIAGNOSIS — K7469 Other cirrhosis of liver: Secondary | ICD-10-CM | POA: Diagnosis not present

## 2018-11-25 DIAGNOSIS — I480 Paroxysmal atrial fibrillation: Secondary | ICD-10-CM | POA: Diagnosis not present

## 2018-11-25 DIAGNOSIS — Z7901 Long term (current) use of anticoagulants: Secondary | ICD-10-CM | POA: Insufficient documentation

## 2018-11-25 LAB — C-REACTIVE PROTEIN: CRP: 10.8 mg/dL — AB (ref <1.0)

## 2018-11-25 LAB — COMPREHENSIVE METABOLIC PANEL
ALT: 21 U/L (ref 0–44)
AST: 26 U/L (ref 15–41)
Albumin: 3.1 g/dL — ABNORMAL LOW (ref 3.5–5.0)
Alkaline Phosphatase: 110 U/L (ref 38–126)
Anion gap: 12 (ref 5–15)
BUN: 16 mg/dL (ref 8–23)
CO2: 19 mmol/L — ABNORMAL LOW (ref 22–32)
Calcium: 8.1 mg/dL — ABNORMAL LOW (ref 8.9–10.3)
Chloride: 98 mmol/L (ref 98–111)
Creatinine, Ser: 1.21 mg/dL — ABNORMAL HIGH (ref 0.44–1.00)
GFR calc Af Amer: 55 mL/min — ABNORMAL LOW (ref >60)
GFR calc non Af Amer: 47 mL/min — ABNORMAL LOW (ref >60)
Glucose, Bld: 411 mg/dL — ABNORMAL HIGH (ref 70–99)
Potassium: 3.3 mmol/L — ABNORMAL LOW (ref 3.5–5.1)
Sodium: 129 mmol/L — ABNORMAL LOW (ref 135–145)
Total Bilirubin: 0.7 mg/dL (ref 0.3–1.2)
Total Protein: 8.3 g/dL — ABNORMAL HIGH (ref 6.5–8.1)

## 2018-11-25 LAB — SEDIMENTATION RATE: Sed Rate: 79 mm/hr — ABNORMAL HIGH (ref 0–30)

## 2018-11-25 LAB — MAGNESIUM: Magnesium: 1.8 mg/dL (ref 1.7–2.4)

## 2018-11-25 LAB — VITAMIN B12: Vitamin B-12: 546 pg/mL (ref 180–914)

## 2018-11-25 NOTE — Progress Notes (Signed)
Patient's Name: [name]  MRN: [id number]  Referring Provider: [name], NP  DOB: 29-Aug-1956  PCP: [name], NP  DOS: 11/25/2018  Note by: Dionisio David NP  Service setting: Ambulatory outpatient  Specialty: Interventional Pain Management  Location: [facility] (AMB) Pain Management Facility    Patient type: New Patient    Primary Reason(s) for Visit: Initial Patient Evaluation CC: Back Pain (lower) and Neck Pain (radiates to shoulders bilaterally)  HPI  Ms. Mamula is a 63 y.o. year old, female patient, who comes today for an initial evaluation. She has DEPRESSION/ANXIETY; Chronic pain syndrome; GERD; DIVERTICULOSIS, COLON; LUMBAR DISC DISPLACEMENT; MYOFASCIAL PAIN SYNDROME; PROTEINURIA; Essential hypertension; Chronically on opiate therapy; CVA (cerebral vascular accident) ([place]); Hypokalemia; Hyperlipidemia with target LDL less than 100; Chronic anemia; Thrombocytopenia ([place]); Prolonged QT interval; Hypomagnesemia; Narcotic abuse ([place]); Uncontrolled type 2 diabetes mellitus ([place]); Symptomatic bradycardia; Malnutrition of moderate degree; Cryptogenic cirrhosis ([place]); Paroxysmal atrial fibrillation ([place]); H/O TIA (transient ischemic attack) and stroke; Narcotic withdrawal ([place]); Tachy-brady syndrome ([place]); NSTEMI (non-ST elevated myocardial infarction) ([place]); Congestive dilated cardiomyopathy ([place]); Personal history of surgery to heart and great vessels, presenting hazards to health; Sepsis ([place]); Diabetic ketoacidosis ([place]); Chronic neck and back pain; History of hypothyroidism; Medical non-compliance; Orthostatic hypotension; Normochromic normocytic anemia; ARF (acute renal failure) ([place]); Recurrent falls; Abdominal pain; Renal mass; Chronic superficial gastritis without bleeding; Dysphagia; Encounter to establish care; History of Clostridium difficile; PAF (paroxysmal atrial fibrillation) ([place]); Atrial fibrillation ([place]); Insulin dependent type 2 diabetes mellitus ([place]); Type 2 diabetes  mellitus with neurologic complication ([place]); Gastro-esophageal reflux disease without esophagitis; History of TIA (transient ischemic attack); Hyperlipidemia; Mixed hyperlipidemia; Diverticulosis; History of uterine cancer; S/P cholecystectomy; Hypothyroidism, unspecified; S/P Nissen fundoplication (without gastrostomy tube) procedure; Chronic bilateral low back pain with bilateral sciatica (Primary Area of Pain) (L>R); Chronic pain of both lower extremities (Secondary Area of Pain) (L>R); Chronic neck pain; Intractable cyclical vomiting with nausea; Pharmacologic therapy; Disorder of skeletal system; and Problems influencing health status on their problem list.. Her primarily concern today is the Back Pain (lower) and Neck Pain (radiates to shoulders bilaterally)  Pain Assessment: Location: Lower Back Radiating: hips, buttocks down back of legs to behind knees bilaterally Onset: More than a month ago Duration: Chronic pain Quality: Aching, Constant, Stabbing, Shooting, Discomfort, Sharp Severity: 10-Worst pain ever/10 (subjective, self-reported pain score)  Note: Reported level is compatible with observation.                         When using our objective Pain Scale, levels between 6 and 10/10 are said to belong in an emergency room, as it progressively worsens from a 6/10, described as severely limiting, requiring emergency care not usually available at an outpatient pain management facility. At a 6/10 level, communication becomes difficult and requires great effort. Assistance to reach the emergency department may be required. Facial flushing and profuse sweating along with potentially dangerous increases in heart rate and blood pressure will be evident. Effect on ADL: "I cant do anything: adls Timing: Constant Modifying factors: rest, medication BP: 109/73  HR: (!) 124  Onset and Duration: Gradual Cause of pain: Unknown Severity: Getting worse, NAS-11 at its worse: 10/10, NAS-11 at its best:  8/10, NAS-11 now: 10/10 and NAS-11 on the average: 9/10 Timing: Not influenced by the time of the day Aggravating Factors: Bending, Bowel movements, Climbing, Kneeling, Lifiting, Motion, Prolonged sitting, Prolonged standing, Squatting, Stooping , Twisting, Walking, Walking uphill and Walking downhill Alleviating  Factors: Lying down, Medications and Resting Associated Problems: Constipation, Night-time cramps, Fatigue, Inability to concentrate, Spasms, Tingling, Pain that wakes patient up and Pain that does not allow patient to sleep Quality of Pain: Burning, Intermittent, Cramping, Disabling, Distressing, Pressure-like, Sharp, Shooting, Stabbing and Uncomfortable Previous Examinations or Tests: CT scan and MRI scan Previous Treatments: Narcotic medications and TENS  The patient comes into the clinics today for the first time for a chronic pain management evaluation.  According to the patient her primary area of pain is in her lower back.  She denies any precipitating factors.  She feels like the back pain is midline.  She denies any previous surgery or interventional therapy.  She is currently doing home physical therapy.  She has had recent images.  Her second area of pain is in her legs.  He admits that the back pain goes down both legs and stops at the knee.  The pain goes down the back of both legs.  She denies any numbness or tingling.  She does have weakness.  Her third area of pain is in her neck.  She describes it as midline pain.  She admits that it goes out to both shoulders.  She denies any arm pain.  She denies any previous surgery or interventional therapy.  Again she is currently in home physical therapy.  She has had recent images.  Today I took the time to provide the patient with information regarding this pain practice. The patient was informed that the practice is divided into two sections: an interventional pain management section, as well as a completely separate and distinct  medication management section. I explained that there are procedure days for interventional therapies, and evaluation days for follow-ups and medication management. Because of the amount of documentation required during both, they are kept separated. This means that there is the possibility that she may be scheduled for a procedure on one day, and medication management the next. I have also informed her that because of staffing and facility limitations, this practice will no longer take patients for medication management only. To illustrate the reasons for this, I gave the patient the example of surgeons, and how inappropriate it would be to refer a patient to his/her care, just to write for the post-surgical antibiotics on a surgery done by a different surgeon.   Because interventional pain management is part of the board-certified specialty for the doctors, the patient was informed that joining this practice means that they are open to any and all interventional therapies. I made it clear that this does not mean that they will be forced to have any procedures done. What this means is that I believe interventional therapies to be essential part of the diagnosis and proper management of chronic pain conditions. Therefore, patients not interested in these interventional alternatives will be better served under the care of a different practitioner.  The patient was also made aware of my Comprehensive Pain Management Safety Guidelines where by joining this practice, they limit all of their nerve blocks and joint injections to those done by our practice, for as long as we are retained to manage their care. Historic Controlled Substance Pharmacotherapy Review  PMP and historical list of controlled substances: Xtampza ER 9 mg, hydrocodone/acetaminophen 5/325 mg, MS Contin 30 mg, hydrocodone/acetaminophen 5/500 mg, oxycodone/acetaminophen 5/325 mg, oxycodone 5 mg, Highest opioid analgesic regimen found: MS Contin 30  mg times daily (fill date 06/06/2016) morphine sulfate 90 mg/day most recent opioid analgesic: Xtampza ER 9  mg 3 times daily (fill date 06/19/2018) oxycodone 27 mg/day  Current opioid analgesics: None Highest recorded MME/day: 90 mg/day MME/day: 0 mg/day Medications: The patient did not bring the medication(s) to the appointment, as requested in our "New Patient Package" Pharmacodynamics: Desired effects: Analgesia: The patient reports >50% benefit. Reported improvement in function: The patient reports medication allows her to accomplish basic ADLs. Clinically meaningful improvement in function (CMIF): Sustained CMIF goals met Perceived effectiveness: Described as relatively effective, allowing for increase in activities of daily living (ADL) Undesirable effects: Side-effects or Adverse reactions: None reported Historical Monitoring: The patient  reports no history of drug use. List of all UDS Test(s): Lab Results  Component Value Date   MDMA NONE DETECTED 01/10/2016   MDMA NONE DETECTED 12/27/2015   COCAINSCRNUR NONE DETECTED 05/22/2018   COCAINSCRNUR NONE DETECTED 01/10/2016   COCAINSCRNUR NONE DETECTED 12/27/2015   COCAINSCRNUR NONE DETECTED 11/02/2007   PCPSCRNUR NONE DETECTED 01/10/2016   PCPSCRNUR NONE DETECTED 12/27/2015   THCU NONE DETECTED 05/22/2018   THCU NONE DETECTED 01/10/2016   THCU NONE DETECTED 12/27/2015   THCU NONE DETECTED 11/02/2007   ETH <10 05/22/2018   ETH  11/02/2007    6        LOWEST DETECTABLE LIMIT FOR SERUM ALCOHOL IS 11 mg/dL FOR MEDICAL PURPOSES ONLY   List of all Serum Drug Screening Test(s):  No results found for: AMPHSCRSER, BARBSCRSER, BENZOSCRSER, COCAINSCRSER, PCPSCRSER, PCPQUANT, THCSCRSER, CANNABQUANT, OPIATESCRSER, OXYSCRSER, PROPOXSCRSER Historical Background Evaluation: [place] PDMP: Six (6) year initial data search conducted.             [place] Department of public safety, offender search: Editor, commissioning Information) Non-contributory Risk  Assessment Profile: Aberrant behavior: None observed or detected today Risk factors for fatal opioid overdose: None identified today Fatal overdose hazard ratio (HR): Calculation deferred Non-fatal overdose hazard ratio (HR): Calculation deferred Risk of opioid abuse or dependence: 0.7-3.0% with doses ? 36 MME/day and 6.1-26% with doses ? 120 MME/day. Substance use disorder (SUD) risk level: Pending results of Medical Psychology Evaluation for SUD Opioid risk tool (ORT) (Total Score): 1  ORT Scoring interpretation table:  Score <3 = Low Risk for SUD  Score between 4-7 = Moderate Risk for SUD  Score >8 = High Risk for Opioid Abuse   PHQ-2 Depression Scale:  Total score: 0  PHQ-2 Scoring interpretation table: (Score and probability of major depressive disorder)  Score 0 = No depression  Score 1 = 15.4% Probability  Score 2 = 21.1% Probability  Score 3 = 38.4% Probability  Score 4 = 45.5% Probability  Score 5 = 56.4% Probability  Score 6 = 78.6% Probability   PHQ-9 Depression Scale:  Total score: 7  PHQ-9 Scoring interpretation table:  Score 0-4 = No depression  Score 5-9 = Mild depression  Score 10-14 = Moderate depression  Score 15-19 = Moderately severe depression  Score 20-27 = Severe depression (2.4 times higher risk of SUD and 2.89 times higher risk of overuse)   Pharmacologic Plan: Pending ordered tests and/or consults  Meds  The patient has a current medication list which includes the following prescription(s): acetaminophen, apixaban, atorvastatin, contour blood glucose system, glucose blood, ibuprofen, insulin glargine, lancet device, and metoprolol tartrate.  Current Outpatient Medications on File Prior to Visit  Medication Sig  . acetaminophen (TYLENOL) 325 MG tablet Take 650 mg by mouth every 6 (six) hours as needed.  Marland Kitchen apixaban (ELIQUIS) 5 MG TABS tablet Take 5 mg by mouth 2 (two) times daily.  Marland Kitchen  atorvastatin (LIPITOR) 40 MG tablet Take 40 mg by mouth daily.  .  Blood Glucose Monitoring Suppl (CONTOUR BLOOD GLUCOSE SYSTEM) w/Device KIT Use to test blood sugar 3 times daily. Dx is E11.65  . glucose blood test strip Contour Test Strips. Use as instructed to test blood sugar 3 times daily.  Marland Kitchen ibuprofen (ADVIL,MOTRIN) 200 MG tablet Take 200 mg by mouth every 6 (six) hours as needed.  . Insulin Glargine (LANTUS SOLOSTAR) 100 UNIT/ML Solostar Pen Inject 20 Units into the skin at bedtime.  Elmore Guise Device MISC Use as instructed to test blood sugar 3 times daily. Dx is E11.65  . metoprolol tartrate (LOPRESSOR) 25 MG tablet Take 0.5 tablets (12.5 mg total) by mouth 2 (two) times daily. For heart rate.   No current facility-administered medications on file prior to visit.    Imaging Review  Cervical Imaging: Cervical MR wo contrast:  Results for orders placed during the hospital encounter of 10/04/15  MR Cervical Spine Wo Contrast   Narrative CLINICAL DATA:  63 year old female with onset of dizziness 4 days ago and fall 2 days ago hitting head. Altered speech. Left-sided weakness. Subsequent encounter.  EXAM: MRI CERVICAL SPINE WITHOUT CONTRAST  TECHNIQUE: Multiplanar, multisequence MR imaging of the cervical spine was performed. No intravenous contrast was administered.  COMPARISON:  10/04/2015 brain MR. 05/27/2015 cervical spine CT.  FINDINGS: Exam is motion degraded.  Cervical medullary junction and visualized intracranial structures unremarkable. No obvious focal cervical cord signal abnormality.  Mild edema within the right C3 facet (series 4, images 1 and 2) raises possibility of bone bruise or subtle fracture versus degenerative changes. No other findings of osseous or soft tissue edema to suggest acute injury.  C2-3:  Negative.  C3-4: Negative.  C4-5:  Minimal bulge.  C5-6: Minimal to mild bulge. Slight narrowing ventral thecal sac greater on the right. Mild right foraminal narrowing.  C6-7:  Minimal bulge.  C7-T1:   Negative.  IMPRESSION: Exam is motion degraded.  Mild edema within the right C3 facet raises possibility of bone bruise or subtle fracture (given history) versus degenerative changes. No other findings of osseous or soft tissue edema to suggest acute injury.  Minimal to mild degenerative changes most notable C5-6 level as detailed above.   Electronically Signed   By: Genia Del M.D.   On: 10/04/2015 19:18    Cervical CT wo contrast:  Results for orders placed during the hospital encounter of 08/18/18  CT Cervical Spine Wo Contrast   Narrative CLINICAL DATA:  63 year old female status post fall backwards this morning striking head on floor.  EXAM: CT HEAD WITHOUT CONTRAST  CT CERVICAL SPINE WITHOUT CONTRAST  TECHNIQUE: Multidetector CT imaging of the head and cervical spine was performed following the standard protocol without intravenous contrast. Multiplanar CT image reconstructions of the cervical spine were also generated.  COMPARISON:  Head CT 05/22/2018. Brain MRI, head and neck MRA 12/29/2017.  FINDINGS: CT HEAD FINDINGS  Brain: No midline shift, ventriculomegaly, mass effect, evidence of mass lesion, intracranial hemorrhage or evidence of cortically based acute infarction. Gray-white matter differentiation is within normal limits throughout the brain.  Vascular: Calcified atherosclerosis at the skull base.  Skull: Stable and intact.  Sinuses/Orbits: Visualized paranasal sinuses and mastoids are stable and well pneumatized.  Other: No scalp soft tissue injury identified. Negative orbits.  CT CERVICAL SPINE FINDINGS  Alignment: Relatively preserved cervical lordosis. Cervicothoracic junction alignment is within normal limits. Bilateral posterior element alignment is within normal limits.  Skull  base and vertebrae: Visualized skull base is intact. No atlanto-occipital dissociation. No cervical spine fracture identified. Small C7 cervical ribs  (normal variant).  Soft tissues and spinal canal: No prevertebral fluid or swelling. No visible canal hematoma. Negative noncontrast neck soft tissues aside from calcified carotid atherosclerosis.  Disc levels: Mild to moderate upper cervical facet hypertrophy primarily on the left. Lower cervical disc and endplate degeneration, mild if any lower cervical spinal stenosis suspected.  Upper chest: Visible upper thoracic levels appear intact. Negative lung apices.  IMPRESSION: 1. No acute traumatic injury identified in the head or cervical spine. 2. Stable and normal for age non contrast CT appearance of the brain.   Electronically Signed   By: Genevie Ann M.D.   On: 08/18/2018 18:34   Thoracic Imaging:  Thoracic DG 2-3 views:  Results for orders placed during the hospital encounter of 08/18/18  DG Thoracic Spine 2 View   Narrative CLINICAL DATA:  Midthoracic spine pain. Multiple falls in the last 3 days.  EXAM: THORACIC SPINE 2 VIEWS  COMPARISON:  Chest x-ray dated 05/22/2018  FINDINGS: There is no evidence of thoracic spine fracture. Slight thoracolumbar scoliosis. No other significant bone abnormalities are identified.  IMPRESSION: No significant abnormality of the thoracic spine.   Electronically Signed   By: Lorriane Shire M.D.   On: 08/18/2018 17:37    Lumbosacral Imaging: Lumbar MR wo contrast:  Results for orders placed during the hospital encounter of 02/28/06  MR Lumbar Spine Wo Contrast   Narrative Clinical Data: Low back pain.  MRI LUMBAR SPINE WITHOUT CONTRAST:  Technique: Multiplanar and multiecho pulse sequences of the lumbar spine, to include the lower thoracic and upper sacral regions, were obtained according to standard protocol without IV contrast.  This study is interpreted accounting for five non-rib-bearing lumbar vertebrae with the last major disk level denoting L5-S1.  Conus terminates at L1, which is unremarkable. The paravertebral  structures are normal.   Alignment of the spine is anatomic. Diffuse lumbar disk desiccation. No appreciable central nor foraminal stenosis. Facet degenerative changes in lower lumbar spine.   Axial imaging:  There is no disk herniation, central nor foraminal stenosis. Facet degenerative changes are appreciated at the L4-5 and L5-S1 levels. There is no central nor foraminal stenosis.  IMPRESSION:  Facet degenerative changes, lower lumbar spine.  Provider: Weyman Rodney  Lumbar DG 2-3 views:  Results for orders placed during the hospital encounter of 12/14/17  DG Lumbar Spine 2-3 Views   Narrative CLINICAL DATA:  Multiple recent falls.  Pain.  EXAM: LUMBAR SPINE - 2-3 VIEW  COMPARISON:  CT, 08/06/2016.  FINDINGS: Mild levoscoliosis, apex at L2. No fracture. No spondylolisthesis. Mild loss of disc height at L5-S1. No other degenerative change.  Scattered aortic vascular calcifications. Right ureteral stent appears stable from the prior CT.  IMPRESSION: 1. No fracture or acute finding. 2. Levoscoliosis and mild L5-S1 disc degenerative changes.   Electronically Signed   By: Lajean Manes M.D.   On: 12/15/2017 13:56    Lumbar DG (Complete) 4+V:  Results for orders placed during the hospital encounter of 10/30/07  DG Lumbar Spine Complete   Narrative Clinical Data: 63 year-old fell, back pain.  LUMBAR SPINE - 5 VIEW:  Comparison: None.  Findings: There is a left convex scoliotic curvature of the lumbar spine with a slight rotatory component. There is also mild osteoporosis. Lateral films demonstrate normal alignment of the lumbar vertebral bodies and the disk spaces and vertebral bodies are maintained.  No fractures are seen. There are facet degenerative changes at L4-5 and L5-S1 but no pars defects are seen. Visualized bony sacrum appears normal.  IMPRESSION:  1. Mild rotatory scoliotic curvature.  2. No acute bony findings.  3. Mild osteoporosis.  Provider: Hampton Abbot   Hip Imaging:  Hip-L MR wo contrast:  Results for orders placed during the hospital encounter of 08/18/18  MR HIP LEFT WO CONTRAST   Narrative CLINICAL DATA:  Left hip pain following 3 recent falls. No hip fracture visible on plain radiographs or CT earlier today.  EXAM: MR OF THE LEFT HIP WITHOUT CONTRAST  TECHNIQUE: Multiplanar, multisequence MR imaging was performed. No intravenous contrast was administered.  COMPARISON:  Pelvis and left hip radiographs obtained earlier today. Abdomen and pelvis CT obtained earlier today.  FINDINGS: Normal appearing left hip with no fracture, dislocation or effusion. No surrounding or adjacent soft tissue edema. The right hip also has a normal appearance as do the pelvic bones. Mild lower lumbar spine dextroconvex scoliosis. Distal portion of a right ureteral stent in the urinary bladder. Unremarkable bowel loops. Previously noted mass-like densities in the retroperitoneal space on the right, inferior to the right kidney and adjacent to the right psoas muscle.  IMPRESSION: 1. Normal appearing left hip without fracture or dislocation. 2. Previously noted retroperitoneal mass-like densities inferior to the right kidney and adjacent to the right psoas muscle. These have MR signal characteristics similar to fluid in adjacent fluid-filled small bowel loops, suggesting that these may represent seromas. Soft tissue masses cannot be excluded. As previously discussed, these can be re-evaluated with a follow-up CT with contrast.   Electronically Signed   By: Claudie Revering M.D.   On: 08/19/2018 00:22    Results for orders placed during the hospital encounter of 08/18/18  DG Hip Unilat W or Wo Pelvis 2-3 Views Left   Narrative CLINICAL DATA:  LEFT hip pain after fall.  EXAM: DG HIP (WITH OR WITHOUT PELVIS) 2-3V LEFT  COMPARISON:  None.  FINDINGS: There is no evidence of hip fracture or dislocation. There is no evidence of arthropathy or  other focal bone abnormality. Osteopenia. RIGHT nephroureteral stent with proximal retaining loop projecting central in pelvis.  IMPRESSION: Negative.  If there is high clinical suspicion for occult hip fracture or the patient refuses to bear weight, consider further evaluation with MRI. Although CT is expeditious, evidence is lacking regarding accuracy of CT over plain film radiography.   Electronically Signed   By: Elon Alas M.D.   On: 08/18/2018 19:56   Note: Available results from prior imaging studies were reviewed.        ROS  Cardiovascular History: Abnormal heart rhythm and High blood pressure Pulmonary or Respiratory History: No reported pulmonary signs or symptoms such as wheezing and difficulty taking a deep full breath (Asthma), difficulty blowing air out (Emphysema), coughing up mucus (Bronchitis), persistent dry cough, or temporary stoppage of breathing during sleep Neurological History: No reported neurological signs or symptoms such as seizures, abnormal skin sensations, urinary and/or fecal incontinence, being born with an abnormal open spine and/or a tethered spinal cord Review of Past Neurological Studies:  Results for orders placed or performed during the hospital encounter of 08/18/18  CT Head Wo Contrast   Narrative   CLINICAL DATA:  63 year old female status post fall backwards this morning striking head on floor.  EXAM: CT HEAD WITHOUT CONTRAST  CT CERVICAL SPINE WITHOUT CONTRAST  TECHNIQUE: Multidetector CT imaging of the head and cervical  spine was performed following the standard protocol without intravenous contrast. Multiplanar CT image reconstructions of the cervical spine were also generated.  COMPARISON:  Head CT 05/22/2018. Brain MRI, head and neck MRA 12/29/2017.  FINDINGS: CT HEAD FINDINGS  Brain: No midline shift, ventriculomegaly, mass effect, evidence of mass lesion, intracranial hemorrhage or evidence of cortically  based acute infarction. Gray-white matter differentiation is within normal limits throughout the brain.  Vascular: Calcified atherosclerosis at the skull base.  Skull: Stable and intact.  Sinuses/Orbits: Visualized paranasal sinuses and mastoids are stable and well pneumatized.  Other: No scalp soft tissue injury identified. Negative orbits.  CT CERVICAL SPINE FINDINGS  Alignment: Relatively preserved cervical lordosis. Cervicothoracic junction alignment is within normal limits. Bilateral posterior element alignment is within normal limits.  Skull base and vertebrae: Visualized skull base is intact. No atlanto-occipital dissociation. No cervical spine fracture identified. Small C7 cervical ribs (normal variant).  Soft tissues and spinal canal: No prevertebral fluid or swelling. No visible canal hematoma. Negative noncontrast neck soft tissues aside from calcified carotid atherosclerosis.  Disc levels: Mild to moderate upper cervical facet hypertrophy primarily on the left. Lower cervical disc and endplate degeneration, mild if any lower cervical spinal stenosis suspected.  Upper chest: Visible upper thoracic levels appear intact. Negative lung apices.  IMPRESSION: 1. No acute traumatic injury identified in the head or cervical spine. 2. Stable and normal for age non contrast CT appearance of the brain.   Electronically Signed   By: Genevie Ann M.D.   On: 08/18/2018 18:34   Results for orders placed or performed during the hospital encounter of 12/27/17  MR BRAIN WO CONTRAST   Narrative   CLINICAL DATA:  Left hemiparesis.  EXAM: MR HEAD WITHOUT CONTRAST  MR CIRCLE OF WILLIS WITHOUT CONTRAST  MRA OF THE NECK WITHOUT AND WITH CONTRAST  TECHNIQUE: Multiplanar, multiecho pulse sequences of the brain, circle of willis and surrounding structures were obtained without intravenous contrast. Angiographic images of the neck were obtained using MRA technique without and  with intravenous contrast.  CONTRAST:  10m MULTIHANCE GADOBENATE DIMEGLUMINE 529 MG/ML IV SOLN  COMPARISON:  Head CT 12/29/2017. Brain MRI 12/29/2015. Carotid Doppler ultrasound 02/16/2015.  FINDINGS: MR HEAD FINDINGS  Brain: There is no evidence of acute infarct, intracranial hemorrhage, mass, midline shift, or extra-axial fluid collection. Mild cerebral atrophy is not greater than expected for age. Scattered small foci of cerebral white matter T2 hyperintensity have slightly progressed from the prior MRI and are nonspecific but compatible with mild chronic small vessel ischemic disease. A chronic right periatrial white matter lacunar infarct is new from the prior MRI. There are also a few new subcentimeter chronic right cerebellar infarcts.  Vascular: Major intracranial vascular flow voids are preserved.  Skull and upper cervical spine: Unremarkable bone marrow signal.  Sinuses/Orbits: Unremarkable orbits. Trace bilateral mastoid effusions. Clear paranasal sinuses.  Other: None.  MR CIRCLE OF WILLIS FINDINGS  The visualized distal vertebral arteries are widely patent to the basilar. Patent right PICA, left AICA, and bilateral SCA origins are identified. The basilar artery is widely patent. There is a large right posterior communicating artery with diminutive right P1 segment. Both PCAs are patent without evidence of significant proximal stenosis. There is some asymmetric attenuation of distal right PCA branch vessels.  The internal carotid arteries are patent from skull base to carotid termini without evidence of stenosis. ACAs and MCAs are patent without evidence of proximal branch occlusion or significant proximal stenosis. No aneurysm is identified.  MRA NECK FINDINGS  There is a standard 3 vessel aortic arch. The brachiocephalic and subclavian arteries are widely patent. The carotid arteries are patent without evidence of dissection or stenosis. The  vertebral arteries are patent and codominant with antegrade flow bilaterally and no evidence of dissection or significant stenosis.  IMPRESSION: 1. No acute intracranial abnormality. 2. Mild chronic small vessel ischemic disease. 3. Small chronic cerebellar infarcts, new from 2017. 4. Patent circle of Willis without significant proximal stenosis. 5. Negative neck MRA.   Electronically Signed   By: Logan Bores M.D.   On: 12/29/2017 13:10   Results for orders placed or performed during the hospital encounter of 12/27/15  MR MRA HEAD WO CONTRAST   Narrative   CLINICAL DATA:  63 year old female with vomiting and dizziness for several days. Initial encounter.  EXAM: MRI HEAD WITHOUT CONTRAST  MRA HEAD WITHOUT CONTRAST  TECHNIQUE: Multiplanar, multiecho pulse sequences of the brain and surrounding structures were obtained without intravenous contrast. Angiographic images of the head were obtained using MRA technique without contrast.  COMPARISON:  Head CT without contrast 12/27/2015. Brain MRI 10/04/2015.  FINDINGS: MRI HEAD FINDINGS  Major intracranial vascular flow voids are stable. No restricted diffusion to suggest acute infarction. No midline shift, mass effect, evidence of mass lesion, ventriculomegaly, extra-axial collection or acute intracranial hemorrhage. Cervicomedullary junction and pituitary are within normal limits. Negative visualized cervical spine. Stable gray and white matter signal throughout the brain; scattered nonspecific cerebral white matter T2 and FLAIR hyperintensity.  Visible internal auditory structures appear normal. Mild bilateral mastoid fluid is stable. Negative nasopharynx. Trace paranasal sinus mucosal thickening has not significantly changed. Orbit and scalp soft tissues are within normal limits. Normal bone marrow signal.  MRA HEAD FINDINGS  Antegrade flow in the posterior circulation with codominant distal vertebral arteries.  Normal PICA origins and vertebrobasilar junction. Normal basilar artery, AICA origins, SCA origins, and left PCA origin. Fetal type right PCA origin. Left posterior communicating artery is diminutive or absent. Bilateral PCA branches are within normal limits.  Antegrade flow in both ICA siphons. No siphon stenosis. Normal ophthalmic and right posterior communicating artery origins. Normal carotid termini, MCA and ACA origins. Anterior communicating artery is diminutive. Visualized bilateral ACA and MCA branches are within normal limits.  IMPRESSION: 1. No acute intracranial abnormality. Stable noncontrast MRI appearance of the brain since January. 2.  Negative intracranial MRA.   Electronically Signed   By: Genevie Ann M.D.   On: 12/29/2015 13:46   Results for orders placed or performed during the hospital encounter of 05/18/15  EEG   Narrative   Greta Doom, MD     05/19/2015  1:28 PM History: 63 yo F with LOC  Sedation: none  Technique: This is a 19 channel routine scalp EEG performed at  the bedside with bipolar and monopolar montages arranged in  accordance to the international 10/20 system of electrode  placement. One channel was dedicated to EKG recording.    Background: The background consists of intermixed alpha and beta  activities. There is a well defined posterior dominant rhythm of  10 Hz that attenuates with eye opening. Sleep is recorded with  normal appearing structures.   Photic stimulation: Physiologic driving is present  EEG Abnormalities: None  Clinical Interpretation: This normal EEG is recorded in the  waking and sleep state. There was no seizure or seizure  predisposition recorded on this study.   Roland Rack, MD [facility] [phone]  If 7pm- 7am, please page  neurology on call as listed in [place].    Psychological-Psychiatric History: No reported psychological or psychiatric signs or symptoms such as difficulty  sleeping, anxiety, depression, delusions or hallucinations (schizophrenial), mood swings (bipolar disorders) or suicidal ideations or attempts Gastrointestinal History: Inflamed pancreas (Pancreatitis) and Irregular, infrequent bowel movements (Constipation) Genitourinary History: No reported renal or genitourinary signs or symptoms such as difficulty voiding or producing urine, peeing blood, non-functioning kidney, kidney stones, difficulty emptying the bladder, difficulty controlling the flow of urine, or chronic kidney disease Hematological History: No reported hematological signs or symptoms such as prolonged bleeding, low or poor functioning platelets, bruising or bleeding easily, hereditary bleeding problems, low energy levels due to low hemoglobin or being anemic Endocrine History: High blood sugar requiring insulin (IDDM) Rheumatologic History: No reported rheumatological signs and symptoms such as fatigue, joint pain, tenderness, swelling, redness, heat, stiffness, decreased range of motion, with or without associated rash Musculoskeletal History: Negative for myasthenia gravis, muscular dystrophy, multiple sclerosis or malignant hyperthermia Work History: Disabled  Allergies  Ms. Schweers is allergic to hydrocodone; aspirin; erythromycin; prednisone; rosiglitazone maleate; codeine sulfate; and tetanus-diphtheria toxoids td.  Laboratory Chemistry  Inflammation Markers No results found for: CRP, ESRSEDRATE (CRP: Acute Phase) (ESR: Chronic Phase) Renal Function Markers Lab Results  Component Value Date   BUN 8 09/26/2018   CREATININE 0.68 09/26/2018   GFRAA >60 09/26/2018   GFRNONAA >60 09/26/2018   Hepatic Function Markers Lab Results  Component Value Date   AST 22 09/26/2018   ALT 15 09/26/2018   ALBUMIN 2.7 (L) 09/26/2018   ALKPHOS 111 09/26/2018   HCVAB 0.2 09/25/2018   Electrolytes Lab Results  Component Value Date   NA 136 09/26/2018   K 4.7 09/26/2018   CL 108  09/26/2018   CALCIUM 8.2 (L) 09/26/2018   MG 1.9 09/25/2018   Neuropathy Markers Lab Results  Component Value Date   VITAMINB12 848 11/11/2015   Bone Pathology Markers Lab Results  Component Value Date   ALKPHOS 111 09/26/2018   CALCIUM 8.2 (L) 09/26/2018   Coagulation Parameters Lab Results  Component Value Date   INR 1.15 09/25/2018   LABPROT 14.6 09/25/2018   APTT 34 05/22/2018   PLT 189 09/25/2018   Cardiovascular Markers Lab Results  Component Value Date   BNP 247.0 (H) 01/10/2016   HGB 11.6 (L) 09/25/2018   HCT 34.0 (L) 09/25/2018   Note: Lab results reviewed.  [place]  Drug: Ms. Lazcano  reports no history of drug use. Alcohol:  reports no history of alcohol use. Tobacco:  reports that she has quit smoking. Her smoking use included cigarettes. She has never used smokeless tobacco. Medical:  has a past medical history of Acute encephalopathy (05/22/2018), Allergy, Anxiety, Ascites, C. difficile colitis (07/10/2015), Cancer ([place]), Cirrhosis of liver not due to alcohol ([place]) (2016), Degenerative disk disease, Diverticulitis, Gastroparesis, GERD (gastroesophageal reflux disease), History of hiatal hernia, Hypertension, Hypothyroid, Hypothyroidism due to amiodarone, Ileus ([place]) (08/01/2015), Intussusception intestine ([place]) (05/2015), Orthostatic hypotension, PAF (paroxysmal atrial fibrillation) ([place]) (03/2015), Pancreatitis, Pneumonia (11/14/2015), Right ureteral stone (07/14/2016), Sick sinus syndrome ([place]), Stomach ulcer, Stroke ([place]), Syncope (01/2015), Syncope due to orthostatic hypotension (05/18/2015), Tachyarrhythmia (01/10/2016), TIA (transient ischemic attack) (02/2015), Type 1 diabetes ([place]), UTERINE CANCER, HX OF (03/27/2007), and UTI (urinary tract infection) (05/22/2018). Family: family history includes CAD in her brother and sister; Heart attack in her sister; Hypertension in her mother.  Past Surgical History:  Procedure Laterality Date  . ABDOMINAL HYSTERECTOMY    . CARDIAC  CATHETERIZATION  N/A 01/12/2016   Procedure: Left Heart Cath and Coronary Angiography;  Surgeon: Wellington Hampshire, MD;  Location: [facility];  Service: Cardiovascular;  Laterality: N/A;  . CHOLECYSTECTOMY    . CYSTOSCOPY/URETEROSCOPY/HOLMIUM LASER Right 07/14/2016   Procedure: CYSTOSCOPY/URETEROSCOPY/HOLMIUM LASER;  Surgeon: Alexis Frock, MD;  Location: [facility] OR[facility];  Service: Urology;  Laterality: Right;  . ESOPHAGOGASTRODUODENOSCOPY N/A 04/04/2015   Procedure: ESOPHAGOGASTRODUODENOSCOPY (EGD);  Surgeon: Hulen Luster, MD;  Location: [facility] ENDOSCOPY;  Service: Endoscopy;  Laterality: N/A;  . ESOPHAGOGASTRODUODENOSCOPY N/A 12/28/2017   Procedure: ESOPHAGOGASTRODUODENOSCOPY (EGD);  Surgeon: Lin Landsman, MD;  Location: [facility] ENDOSCOPY;  Service: Gastroenterology;  Laterality: N/A;  . ESOPHAGOGASTRODUODENOSCOPY (EGD) WITH PROPOFOL N/A 01/18/2016   Procedure: ESOPHAGOGASTRODUODENOSCOPY (EGD) WITH PROPOFOL;  Surgeon: Lucilla Lame, MD;  Location: [facility] ENDOSCOPY;  Service: Endoscopy;  Laterality: N/A;  . FLEXIBLE SIGMOIDOSCOPY N/A 01/18/2016   Procedure: FLEXIBLE SIGMOIDOSCOPY;  Surgeon: Lucilla Lame, MD;  Location: [facility] ENDOSCOPY;  Service: Endoscopy;  Laterality: N/A;  . HERNIA REPAIR     Active Ambulatory Problems    Diagnosis Date Noted  . DEPRESSION/ANXIETY 06/27/2007  . Chronic pain syndrome 03/28/2007  . GERD 03/27/2007  . DIVERTICULOSIS, COLON 03/27/2007  . LUMBAR DISC DISPLACEMENT 03/27/2007  . MYOFASCIAL PAIN SYNDROME 06/27/2007  . PROTEINURIA 03/27/2007  . Essential hypertension 01/12/2015  . Chronically on opiate therapy 01/12/2015  . CVA (cerebral vascular accident) ([place]) 02/15/2015  . Hypokalemia 04/06/2015  . Hyperlipidemia with target LDL less than 100 04/06/2015  . Chronic anemia 05/19/2015  . Thrombocytopenia ([place]) 05/19/2015  . Prolonged QT interval   . Hypomagnesemia   . Narcotic abuse ([place]) 11/22/2015  . Uncontrolled type 2 diabetes mellitus ([place])   . Symptomatic  bradycardia 05/27/2015  . Malnutrition of moderate degree 07/04/2015  . Cryptogenic cirrhosis ([place]) 07/10/2015  . Paroxysmal atrial fibrillation ([place]) 07/10/2015  . H/O TIA (transient ischemic attack) and stroke   . Narcotic withdrawal ([place]) 11/11/2015  . Tachy-brady syndrome ([place]) 12/28/2015  . NSTEMI (non-ST elevated myocardial infarction) ([place]) 01/11/2016  . Congestive dilated cardiomyopathy ([place])   . Personal history of surgery to heart and great vessels, presenting hazards to health   . Sepsis ([place]) 12/15/2017  . Diabetic ketoacidosis ([place]) 05/22/2018  . Chronic neck and back pain 08/14/2018  . History of hypothyroidism 08/14/2018  . Medical non-compliance 08/14/2018  . Orthostatic hypotension 08/18/2018  . Normochromic normocytic anemia 08/18/2018  . ARF (acute renal failure) ([place]) 08/18/2018  . Recurrent falls 09/12/2018  . Abdominal pain 09/25/2018  . Renal mass 10/06/2018  . Chronic superficial gastritis without bleeding 11/22/2015  . Dysphagia 04/19/2014  . Encounter to establish care 08/31/2015  . History of Clostridium difficile 11/22/2015  . PAF (paroxysmal atrial fibrillation) ([place]) 11/24/2015  . Atrial fibrillation ([place]) 08/31/2015  . Insulin dependent type 2 diabetes mellitus ([place]) 08/31/2015  . Type 2 diabetes mellitus with neurologic complication ([place]) 22/29/7989  . Gastro-esophageal reflux disease without esophagitis 04/19/2014  . History of TIA (transient ischemic attack) 11/22/2015  . Hyperlipidemia 08/31/2015  . Mixed hyperlipidemia 11/22/2015  . Diverticulosis 11/22/2015  . History of uterine cancer 11/22/2015  . S/P cholecystectomy 11/23/2015  . Hypothyroidism, unspecified 11/22/2015  . S/P Nissen fundoplication (without gastrostomy tube) procedure 05/11/2014  . Chronic bilateral low back pain with bilateral sciatica (Primary Area of Pain) (L>R) 11/25/2018  . Chronic pain of both lower extremities (Secondary Area of Pain) (L>R) 11/25/2018  . Chronic  neck pain 11/25/2018  . Intractable cyclical vomiting with nausea 11/22/2015  . Pharmacologic therapy  11/25/2018  . Disorder of skeletal system 11/25/2018  . Problems influencing health status 11/25/2018   Resolved Ambulatory Problems    Diagnosis Date Noted  . DM2 (diabetes mellitus, type 2) ([place]) 03/27/2007  . UTERINE CANCER, HX OF 03/27/2007  . Orthostatic syncope 01/11/2015  . Nausea with vomiting 01/12/2015  . Dehydration 01/12/2015  . Orthostatic hypotension 01/12/2015  . Gastroparesis 01/12/2015  . Syncope and collapse 03/11/2015  . Abdominal pain 04/03/2015  . Atrial fibrillation, new onset ([place]) 04/05/2015  . Protein-calorie malnutrition, severe ([place]) 04/05/2015  . Constipation   . Epigastric pain   . Diabetes type 1, uncontrolled ([place])   . Demand ischemia ([place]) 04/06/2015  . Diabetes mellitus type I ([place]) 04/06/2015  . Nausea and vomiting 04/07/2015  . Syncope due to orthostatic hypotension 05/18/2015  . Chronic atrial fibrillation ([place]) 05/19/2015  . Diabetes mellitus type 2, controlled ([place]) 05/19/2015  . Diarrhea 05/19/2015  . Bradycardia 05/19/2015  . Paroxysmal atrial fibrillation ([facility])   . Other specified hypotension   . Colitis   . Acute renal failure syndrome ([place])   . Hypothyroidism due to amiodarone   . Abdominal pain, chronic, epigastric   . Intussusception ([place])   . Diabetes type 2, uncontrolled ([place])   . Intussusception intestine ([place])   . Acute encephalopathy 05/27/2015  . Elevated troponin 05/27/2015  . Chest pain   . Intractable nausea and vomiting 07/04/2015  . GI (gastrointestinal bleed) 07/06/2015  . Encephalopathy, metabolic 09/38/1829  . C. difficile colitis 07/10/2015  . Generalized weakness 07/10/2015  . Ileus ([place]) 08/01/2015  . Ascites   . Hypotension 08/06/2015  . Left-sided weakness 10/04/2015  . Expressive aphasia 10/04/2015  . Speech abnormality   . Bradycardia with 41 - 50 beats per minute   . Thyroid activity decreased    . Diabetes mellitus type 2, insulin dependent ([facility]) 11/11/2015  . Orthostatic hypotension   . Pneumonia 11/14/2015  . Colitis 12/16/2015  . Abdominal pain 12/27/2015  . Tachyarrhythmia 01/10/2016  . Intractable nausea and vomiting 01/10/2016  . Generalized abdominal pain   . Nausea & vomiting   . Abdominal pain, epigastric   . Gastritis   . Foreign body in stomach   . Abnormal findings-gastrointestinal tract   . Diarrhea   . Epigastric pain   . Right ureteral stone 07/14/2016  . Melena 12/27/2017  . GI bleed 12/28/2017  . Acute encephalopathy 05/22/2018  . UTI (urinary tract infection) 05/22/2018   Past Medical History:  Diagnosis Date  . Allergy   . Anxiety   . Cancer ([place])   . Cirrhosis of liver not due to alcohol ([name]) 2016  . Degenerative disk disease   . Diverticulitis   . GERD (gastroesophageal reflux disease)   . History of hiatal hernia   . Hypertension   . Hypothyroid   . Pancreatitis   . Sick sinus syndrome ([place])   . Stomach ulcer   . Stroke ([place])   . Syncope 01/2015  . TIA (transient ischemic attack) 02/2015  . Type 1 diabetes ([place])    Constitutional Exam  General appearance: Well nourished, well developed, and well hydrated. In no apparent acute distress Vitals:   11/25/18 1242  BP: 109/73  Pulse: (!) 124  Resp: 16  Temp: 98.3 F (36.8 C)  SpO2: 100%  Weight: 130 lb (59 kg)  Height: 5' 3"  (1.6 m)   BMI Assessment: Estimated body mass index is 23.03 kg/m as calculated from the following:   Height as of this encounter:  5' 3"  (1.6 m).   Weight as of this encounter: 130 lb (59 kg).  BMI interpretation table: BMI level Category Range association with higher incidence of chronic pain  <18 kg/m2 Underweight   18.5-24.9 kg/m2 Ideal body weight   25-29.9 kg/m2 Overweight Increased incidence by 20%  30-34.9 kg/m2 Obese (Class I) Increased incidence by 68%  35-39.9 kg/m2 Severe obesity (Class II) Increased incidence by 136%  >40 kg/m2 Extreme obesity  (Class III) Increased incidence by 254%   BMI Readings from Last 4 Encounters:  11/25/18 23.03 kg/m  11/17/18 23.07 kg/m  10/06/18 23.56 kg/m  09/25/18 22.14 kg/m   Wt Readings from Last 4 Encounters:  11/25/18 130 lb (59 kg)  11/17/18 130 lb 4 oz (59.1 kg)  10/06/18 133 lb (60.3 kg)  09/25/18 125 lb (56.7 kg)  Psych/Mental status: Alert, oriented x 3 (person, place, & time)       Eyes: PERLA Respiratory: No evidence of acute respiratory distress  Cervical Spine Exam  Inspection: No masses, redness, or swelling Alignment: Symmetrical Functional ROM: Decreased ROM      Stability: No instability detected Muscle strength & Tone: Functionally intact Sensory: Unimpaired Palpation: Complains of area being tender to palpation              Upper Extremity (UE) Exam    Side: Right upper extremity  Side: Left upper extremity  Inspection: No masses, redness, swelling, or asymmetry. No contractures  Inspection: No masses, redness, swelling, or asymmetry. No contractures  Functional ROM: Decreased ROM for shoulder  Functional ROM: Decreased ROM for shoulder  Muscle strength & Tone: Functionally intact  Muscle strength & Tone: Functionally intact  Sensory: Unimpaired  Sensory: Unimpaired  Palpation: No palpable anomalies              Palpation: No palpable anomalies              Specialized Test(s): Deferred         Specialized Test(s): Deferred          Thoracic Spine Exam  Inspection: No masses, redness, or swelling Alignment: Symmetrical Functional ROM: Unrestricted ROM Stability: No instability detected Sensory: Unimpaired Muscle strength & Tone: No palpable anomalies  Lumbar Spine Exam  Inspection: No masses, redness, or swelling Alignment: Symmetrical Functional ROM: Diminished ROM      Stability: No instability detected Muscle strength & Tone: Functionally intact Sensory: Unimpaired Palpation: No palpable anomalies       Provocative Tests: Lumbar Hyperextension and  rotation test: evaluation deferred today       Patrick's Maneuver: evaluation deferred today                    Gait & Posture Assessment  Ambulation: Unassisted Gait: Relatively normal for age and body habitus Posture: WNL   Lower Extremity Exam    Side: Right lower extremity  Side: Left lower extremity  Inspection: No masses, redness, swelling, or asymmetry. No contractures  Inspection: No masses, redness, swelling, or asymmetry. No contractures  Functional ROM: Unrestricted ROM          Functional ROM: Unrestricted ROM          Muscle strength & Tone: Functionally intact  Muscle strength & Tone: Functionally intact  Sensory: Unimpaired  Sensory: Unimpaired  Palpation: No palpable anomalies  Palpation: No palpable anomalies   Assessment  Primary Diagnosis & Pertinent Problem List: The primary encounter diagnosis was Chronic bilateral low back pain with bilateral sciatica (Primary Area of  Pain) (L>R). Diagnoses of Chronic pain of both lower extremities (Secondary Area of Pain) (L>R), Chronic neck pain, Chronic pain syndrome, Pharmacologic therapy, Disorder of skeletal system, and Problems influencing health status were also pertinent to this visit.  Visit Diagnosis: 1. Chronic bilateral low back pain with bilateral sciatica (Primary Area of Pain) (L>R)   2. Chronic pain of both lower extremities (Secondary Area of Pain) (L>R)   3. Chronic neck pain   4. Chronic pain syndrome   5. Pharmacologic therapy   6. Disorder of skeletal system   7. Problems influencing health status    Plan of Care  Initial treatment plan:  Please be advised that as per protocol, today's visit has been an evaluation only. We have not taken over the patient's controlled substance management.  Problem-specific plan: No problem-specific Assessment & Plan notes found for this encounter.  Ordered Lab-work, Procedure(s), Referral(s), & Consult(s): Orders Placed This Encounter  Procedures  . Compliance Drug  Analysis, Ur  . Comp. Metabolic Panel (12)  . Magnesium  . Vitamin B12  . Sedimentation rate  . 25-Hydroxyvitamin D Lcms D2+D3  . C-reactive protein   Pharmacotherapy: Medications ordered:  No orders of the defined types were placed in this encounter.  Medications administered during this visit: [name] A. Rabago had no medications administered during this visit.   Pharmacotherapy under consideration:  Opioid Analgesics: The patient was informed that there is no guarantee that she would be a candidate for opioid analgesics. The decision will be made following CDC guidelines. This decision will be based on the results of diagnostic studies, as well as Ms. Antonini's risk profile.  Membrane stabilizer: To be determined at a later time Muscle relaxant: To be determined at a later time NSAID: To be determined at a later time Other analgesic(s): To be determined at a later time   Interventional therapies under consideration: Ms. Mangrum was informed that there is no guarantee that she would be a candidate for interventional therapies. The decision will be based on the results of diagnostic studies, as well as Ms. Antonson's risk profile.  Possible procedure(s): Diagnostic bilateral lumbar facet nerve block Possible bilateral lumbar facet radiofrequency ablation Diagnostic bilateral cervical facet nerve block Possible bilateral cervical facet radiofrequency ablation   Provider-requested follow-up: Return for 2nd Visit, w/ Dr. Dossie Arbour.  Future Appointments  Date Time Provider [place]  12/08/2018  9:20 AM [name], NP [facility]  12/17/2018  1:30 PM Milinda Pointer, MD [facility] None    Primary Care Physician: [name], NP Location: [facility] [facility] Outpatient Pain Management Facility Note by:  Date: 11/25/2018; Time: 3:03 PM  Pain Score Disclaimer: We use the NRS-11 scale. This is a self-reported, subjective measurement of pain severity with only modest accuracy. It is  used primarily to identify changes within a particular patient. It must be understood that outpatient pain scales are significantly less accurate that those used for research, where they can be applied under ideal controlled circumstances with minimal exposure to variables. In reality, the score is likely to be a combination of pain intensity and pain affect, where pain affect describes the degree of emotional arousal or changes in action readiness caused by the sensory experience of pain. Factors such as social and work situation, setting, emotional state, anxiety levels, expectation, and prior pain experience may influence pain perception and show large inter-individual differences that may also be affected by time variables.  Patient instructions provided during this appointment: Patient Instructions   ____________________________________________________________________________________________  Appointment  Policy Summary  It is our goal and responsibility to provide the medical community with assistance in the evaluation and management of patients with chronic pain. Unfortunately our resources are limited. Because we do not have an unlimited amount of time, or available appointments, we are required to closely monitor and manage their use. The following rules exist to maximize their use:  Patient's responsibilities: 1. Punctuality:  At what time should I arrive? You should be physically present in our office 30 minutes before your scheduled appointment. Your scheduled appointment is with your assigned healthcare provider. However, it takes 5-10 minutes to be "checked-in", and another 15 minutes for the nurses to do the admission. If you arrive to our office at the time you were given for your appointment, you will end up being at least 20-25 minutes late to your appointment with the provider. 2. Tardiness:  What happens if I arrive only a few minutes after my scheduled appointment time? You will  need to reschedule your appointment. The cutoff is your appointment time. This is why it is so important that you arrive at least 30 minutes before that appointment. If you have an appointment scheduled for 10:00 AM and you arrive at 10:01, you will be required to reschedule your appointment.  3. Plan ahead:  Always assume that you will encounter traffic on your way in. Plan for it. If you are dependent on a driver, make sure they understand these rules and the need to arrive early. 4. Other appointments and responsibilities:  Avoid scheduling any other appointments before or after your pain clinic appointments.  5. Be prepared:  Write down everything that you need to discuss with your healthcare provider and give this information to the admitting nurse. Write down the medications that you will need refilled. Bring your pills and bottles (even the empty ones), to all of your appointments, except for those where a procedure is scheduled. 6. No children or pets:  Find someone to take care of them. It is not appropriate to bring them in. 7. Scheduling changes:  We request "advanced notification" of any changes or cancellations. 8. Advanced notification:  Defined as a time period of more than 24 hours prior to the originally scheduled appointment. This allows for the appointment to be offered to other patients. 9. Rescheduling:  When a visit is rescheduled, it will require the cancellation of the original appointment. For this reason they both fall within the category of "Cancellations".  10. Cancellations:  They require advanced notification. Any cancellation less than 24 hours before the  appointment will be recorded as a "No Show". 11. No Show:  Defined as an unkept appointment where the patient failed to notify or declare to the practice their intention or inability to keep the appointment.  Corrective process for repeat offenders:  1. Tardiness: Three (3) episodes of rescheduling due to late  arrivals will be recorded as one (1) "No Show". 2. Cancellation or reschedule: Three (3) cancellations or rescheduling will be recorded as one (1) "No Show". 3. "No Shows": Three (3) "No Shows" within a 12 month period will result in discharge from the practice. ____________________________________________________________________________________________   ______________________________________________________________________________________________  Specialty Pain Scale  Introduction:  There are significant differences in how pain is reported. The word pain usually refers to physical pain, but it is also a common synonym of suffering. The medical community uses a scale from 0 (zero) to 10 (ten) to report pain level. Zero (0) is described as "no pain", while ten (  10) is described as "the worse pain you can imagine". The problem with this scale is that physical pain is reported along with suffering. Suffering refers to mental pain, or more often yet it refers to any unpleasant feeling, emotion or aversion associated with the perception of harm or threat of harm. It is the psychological component of pain.  Pain Specialists prefer to separate the two components. The pain scale used by this practice is the Verbal Numerical Rating Scale (VNRS-11). This scale is for the physical pain only. DO NOT INCLUDE how your pain psychologically affects you. This scale is for adults 81 years of age and older. It has 11 (eleven) levels. The 1st level is 0/10. This means: "right now, I have no pain". In the context of pain management, it also means: "right now, my physical pain is under control with the current therapy".  General Information:  The scale should reflect your current level of pain. Unless you are specifically asked for the level of your worst pain, or your average pain. If you are asked for one of these two, then it should be understood that it is over the past 24 hours.  Levels 1 (one) through 5 (five) are  described below, and can be treated as an outpatient. Ambulatory pain management facilities such as ours are more than adequate to treat these levels. Levels 6 (six) through 10 (ten) are also described below, however, these must be treated as a hospitalized patient. While levels 6 (six) and 7 (seven) may be evaluated at an urgent care facility, levels 8 (eight) through 10 (ten) constitute medical emergencies and as such, they belong in a hospital's emergency department. When having these levels (as described below), do not come to our office. Our facility is not equipped to manage these levels. Go directly to an urgent care facility or an emergency department to be evaluated.  Definitions:  Activities of Daily Living (ADL): Activities of daily living (ADL or ADLs) is a term used in healthcare to refer to people's daily self-care activities. Health professionals often use a person's ability or inability to perform ADLs as a measurement of their functional status, particularly in regard to people post injury, with disabilities and the elderly. There are two ADL levels: Basic and Instrumental. Basic Activities of Daily Living (BADL  or BADLs) consist of self-care tasks that include: Bathing and showering; personal hygiene and grooming (including brushing/combing/styling hair); dressing; Toilet hygiene (getting to the toilet, cleaning oneself, and getting back up); eating and self-feeding (not including cooking or chewing and swallowing); functional mobility, often referred to as "transferring", as measured by the ability to walk, get in and out of bed, and get into and out of a chair; the broader definition (moving from one place to another while performing activities) is useful for people with different physical abilities who are still able to get around independently. Basic ADLs include the things many people do when they get up in the morning and get ready to go out of the house: get out of bed, go to the  toilet, bathe, dress, groom, and eat. On the average, loss of function typically follows a particular order. Hygiene is the first to go, followed by loss of toilet use and locomotion. The last to go is the ability to eat. When there is only one remaining area in which the person is independent, there is a 62.9% chance that it is eating and only a 3.5% chance that it is hygiene. Instrumental Activities of Daily  Living (IADL or IADLs) are not necessary for fundamental functioning, but they let an individual live independently in a community. IADL consist of tasks that include: cleaning and maintaining the house; home establishment and maintenance; care of others (including selecting and supervising caregivers); care of pets; child rearing; managing money; managing financials (investments, etc.); meal preparation and cleanup; shopping for groceries and necessities; moving within the community; safety procedures and emergency responses; health management and maintenance (taking prescribed medications); and using the telephone or other form of communication.  Instructions:  Most patients tend to report their pain as a combination of two factors, their physical pain and their psychosocial pain. This last one is also known as "suffering" and it is reflection of how physical pain affects you socially and psychologically. From now on, report them separately.  From this point on, when asked to report your pain level, report only your physical pain. Use the following table for reference.  Pain Clinic Pain Levels (0-5/10)  Pain Level Score  Description  No Pain 0   Mild pain 1 Nagging, annoying, but does not interfere with basic activities of daily living (ADL). Patients are able to eat, bathe, get dressed, toileting (being able to get on and off the toilet and perform personal hygiene functions), transfer (move in and out of bed or a chair without assistance), and maintain continence (able to control bladder and  bowel functions). Blood pressure and heart rate are unaffected. A normal heart rate for a healthy adult ranges from 60 to 100 bpm (beats per minute).   Mild to moderate pain 2 Noticeable and distracting. Impossible to hide from other people. More frequent flare-ups. Still possible to adapt and function close to normal. It can be very annoying and may have occasional stronger flare-ups. With discipline, patients may get used to it and adapt.   Moderate pain 3 Interferes significantly with activities of daily living (ADL). It becomes difficult to feed, bathe, get dressed, get on and off the toilet or to perform personal hygiene functions. Difficult to get in and out of bed or a chair without assistance. Very distracting. With effort, it can be ignored when deeply involved in activities.   Moderately severe pain 4 Impossible to ignore for more than a few minutes. With effort, patients may still be able to manage work or participate in some social activities. Very difficult to concentrate. Signs of autonomic nervous system discharge are evident: dilated pupils (mydriasis); mild sweating (diaphoresis); sleep interference. Heart rate becomes elevated (>115 bpm). Diastolic blood pressure (lower number) rises above 100 mmHg. Patients find relief in laying down and not moving.   Severe pain 5 Intense and extremely unpleasant. Associated with frowning face and frequent crying. Pain overwhelms the senses.  Ability to do any activity or maintain social relationships becomes significantly limited. Conversation becomes difficult. Pacing back and forth is common, as getting into a comfortable position is nearly impossible. Pain wakes you up from deep sleep. Physical signs will be obvious: pupillary dilation; increased sweating; goosebumps; brisk reflexes; cold, clammy hands and feet; nausea, vomiting or dry heaves; loss of appetite; significant sleep disturbance with inability to fall asleep or to remain asleep. When  persistent, significant weight loss is observed due to the complete loss of appetite and sleep deprivation.  Blood pressure and heart rate becomes significantly elevated. Caution: If elevated blood pressure triggers a pounding headache associated with blurred vision, then the patient should immediately seek attention at an urgent or emergency care unit, as these may  be signs of an impending stroke.    Emergency Department Pain Levels (6-10/10)  Emergency Room Pain 6 Severely limiting. Requires emergency care and should not be seen or managed at an outpatient pain management facility. Communication becomes difficult and requires great effort. Assistance to reach the emergency department may be required. Facial flushing and profuse sweating along with potentially dangerous increases in heart rate and blood pressure will be evident.   Distressing pain 7 Self-care is very difficult. Assistance is required to transport, or use restroom. Assistance to reach the emergency department will be required. Tasks requiring coordination, such as bathing and getting dressed become very difficult.   Disabling pain 8 Self-care is no longer possible. At this level, pain is disabling. The individual is unable to do even the most "basic" activities such as walking, eating, bathing, dressing, transferring to a bed, or toileting. Fine motor skills are lost. It is difficult to think clearly.   Incapacitating pain 9 Pain becomes incapacitating. Thought processing is no longer possible. Difficult to remember your own name. Control of movement and coordination are lost.   The worst pain imaginable 10 At this level, most patients pass out from pain. When this level is reached, collapse of the autonomic nervous system occurs, leading to a sudden drop in blood pressure and heart rate. This in turn results in a temporary and dramatic drop in blood flow to the brain, leading to a loss of consciousness. Fainting is one of the body's  self defense mechanisms. Passing out puts the brain in a calmed state and causes it to shut down for a while, in order to begin the healing process.    Summary: 1.   Refer to this scale when providing [name] with your pain level. 2.   Be accurate and careful when reporting your pain level. This will help with your care. 3.   Over-reporting your pain level will lead to loss of credibility. 4.   Even a level of 1/10 means that there is pain and will be treated at our facility. 5.   High, inaccurate reporting will be documented as "Symptom Exaggeration", leading to loss of credibility and suspicions of possible secondary gains such as obtaining more narcotics, or wanting to appear disabled, for fraudulent reasons. 6.   Only pain levels of 5 or below will be seen at our facility. 7.   Pain levels of 6 and above will be sent to the Emergency Department and the appointment cancelled.  ______________________________________________________________________________________________

## 2018-11-25 NOTE — Progress Notes (Signed)
Safety precautions to be maintained throughout the outpatient stay will include: orient to surroundings, keep bed in low position, maintain call bell within reach at all times, provide assistance with transfer out of bed and ambulation.  

## 2018-11-25 NOTE — Patient Instructions (Signed)
____________________________________________________________________________________________  Appointment Policy Summary  It is our goal and responsibility to provide the medical community with assistance in the evaluation and management of patients with chronic pain. Unfortunately our resources are limited. Because we do not have an unlimited amount of time, or available appointments, we are required to closely monitor and manage their use. The following rules exist to maximize their use:  Patient's responsibilities: 1. Punctuality:  At what time should I arrive? You should be physically present in our office 30 minutes before your scheduled appointment. Your scheduled appointment is with your assigned healthcare provider. However, it takes 5-10 minutes to be "checked-in", and another 15 minutes for the nurses to do the admission. If you arrive to our office at the time you were given for your appointment, you will end up being at least 20-25 minutes late to your appointment with the provider. 2. Tardiness:  What happens if I arrive only a few minutes after my scheduled appointment time? You will need to reschedule your appointment. The cutoff is your appointment time. This is why it is so important that you arrive at least 30 minutes before that appointment. If you have an appointment scheduled for 10:00 AM and you arrive at 10:01, you will be required to reschedule your appointment.  3. Plan ahead:  Always assume that you will encounter traffic on your way in. Plan for it. If you are dependent on a driver, make sure they understand these rules and the need to arrive early. 4. Other appointments and responsibilities:  Avoid scheduling any other appointments before or after your pain clinic appointments.  5. Be prepared:  Write down everything that you need to discuss with your healthcare provider and give this information to the admitting nurse. Write down the medications that you will need  refilled. Bring your pills and bottles (even the empty ones), to all of your appointments, except for those where a procedure is scheduled. 6. No children or pets:  Find someone to take care of them. It is not appropriate to bring them in. 7. Scheduling changes:  We request "advanced notification" of any changes or cancellations. 8. Advanced notification:  Defined as a time period of more than 24 hours prior to the originally scheduled appointment. This allows for the appointment to be offered to other patients. 9. Rescheduling:  When a visit is rescheduled, it will require the cancellation of the original appointment. For this reason they both fall within the category of "Cancellations".  10. Cancellations:  They require advanced notification. Any cancellation less than 24 hours before the  appointment will be recorded as a "No Show". 11. No Show:  Defined as an unkept appointment where the patient failed to notify or declare to the practice their intention or inability to keep the appointment.  Corrective process for repeat offenders:  1. Tardiness: Three (3) episodes of rescheduling due to late arrivals will be recorded as one (1) "No Show". 2. Cancellation or reschedule: Three (3) cancellations or rescheduling will be recorded as one (1) "No Show". 3. "No Shows": Three (3) "No Shows" within a 12 month period will result in discharge from the practice. ____________________________________________________________________________________________   ______________________________________________________________________________________________  Specialty Pain Scale  Introduction:  There are significant differences in how pain is reported. The word pain usually refers to physical pain, but it is also a common synonym of suffering. The medical community uses a scale from 0 (zero) to 10 (ten) to report pain level. Zero (0) is described as "no pain",  while ten (10) is described as "the worse pain  you can imagine". The problem with this scale is that physical pain is reported along with suffering. Suffering refers to mental pain, or more often yet it refers to any unpleasant feeling, emotion or aversion associated with the perception of harm or threat of harm. It is the psychological component of pain.  Pain Specialists prefer to separate the two components. The pain scale used by this practice is the Verbal Numerical Rating Scale (VNRS-11). This scale is for the physical pain only. DO NOT INCLUDE how your pain psychologically affects you. This scale is for adults 59 years of age and older. It has 11 (eleven) levels. The 1st level is 0/10. This means: "right now, I have no pain". In the context of pain management, it also means: "right now, my physical pain is under control with the current therapy".  General Information:  The scale should reflect your current level of pain. Unless you are specifically asked for the level of your worst pain, or your average pain. If you are asked for one of these two, then it should be understood that it is over the past 24 hours.  Levels 1 (one) through 5 (five) are described below, and can be treated as an outpatient. Ambulatory pain management facilities such as ours are more than adequate to treat these levels. Levels 6 (six) through 10 (ten) are also described below, however, these must be treated as a hospitalized patient. While levels 6 (six) and 7 (seven) may be evaluated at an urgent care facility, levels 8 (eight) through 10 (ten) constitute medical emergencies and as such, they belong in a hospital's emergency department. When having these levels (as described below), do not come to our office. Our facility is not equipped to manage these levels. Go directly to an urgent care facility or an emergency department to be evaluated.  Definitions:  Activities of Daily Living (ADL): Activities of daily living (ADL or ADLs) is a term used in healthcare to refer to  people's daily self-care activities. Health professionals often use a person's ability or inability to perform ADLs as a measurement of their functional status, particularly in regard to people post injury, with disabilities and the elderly. There are two ADL levels: Basic and Instrumental. Basic Activities of Daily Living (BADL  or BADLs) consist of self-care tasks that include: Bathing and showering; personal hygiene and grooming (including brushing/combing/styling hair); dressing; Toilet hygiene (getting to the toilet, cleaning oneself, and getting back up); eating and self-feeding (not including cooking or chewing and swallowing); functional mobility, often referred to as "transferring", as measured by the ability to walk, get in and out of bed, and get into and out of a chair; the broader definition (moving from one place to another while performing activities) is useful for people with different physical abilities who are still able to get around independently. Basic ADLs include the things many people do when they get up in the morning and get ready to go out of the house: get out of bed, go to the toilet, bathe, dress, groom, and eat. On the average, loss of function typically follows a particular order. Hygiene is the first to go, followed by loss of toilet use and locomotion. The last to go is the ability to eat. When there is only one remaining area in which the person is independent, there is a 62.9% chance that it is eating and only a 3.5% chance that it is hygiene. Instrumental Activities  of Daily Living (IADL or IADLs) are not necessary for fundamental functioning, but they let an individual live independently in a community. IADL consist of tasks that include: cleaning and maintaining the house; home establishment and maintenance; care of others (including selecting and supervising caregivers); care of pets; child rearing; managing money; managing financials (investments, etc.); meal preparation  and cleanup; shopping for groceries and necessities; moving within the community; safety procedures and emergency responses; health management and maintenance (taking prescribed medications); and using the telephone or other form of communication.  Instructions:  Most patients tend to report their pain as a combination of two factors, their physical pain and their psychosocial pain. This last one is also known as "suffering" and it is reflection of how physical pain affects you socially and psychologically. From now on, report them separately.  From this point on, when asked to report your pain level, report only your physical pain. Use the following table for reference.  Pain Clinic Pain Levels (0-5/10)  Pain Level Score  Description  No Pain 0   Mild pain 1 Nagging, annoying, but does not interfere with basic activities of daily living (ADL). Patients are able to eat, bathe, get dressed, toileting (being able to get on and off the toilet and perform personal hygiene functions), transfer (move in and out of bed or a chair without assistance), and maintain continence (able to control bladder and bowel functions). Blood pressure and heart rate are unaffected. A normal heart rate for a healthy adult ranges from 60 to 100 bpm (beats per minute).   Mild to moderate pain 2 Noticeable and distracting. Impossible to hide from other people. More frequent flare-ups. Still possible to adapt and function close to normal. It can be very annoying and may have occasional stronger flare-ups. With discipline, patients may get used to it and adapt.   Moderate pain 3 Interferes significantly with activities of daily living (ADL). It becomes difficult to feed, bathe, get dressed, get on and off the toilet or to perform personal hygiene functions. Difficult to get in and out of bed or a chair without assistance. Very distracting. With effort, it can be ignored when deeply involved in activities.   Moderately severe pain  4 Impossible to ignore for more than a few minutes. With effort, patients may still be able to manage work or participate in some social activities. Very difficult to concentrate. Signs of autonomic nervous system discharge are evident: dilated pupils (mydriasis); mild sweating (diaphoresis); sleep interference. Heart rate becomes elevated (>115 bpm). Diastolic blood pressure (lower number) rises above 100 mmHg. Patients find relief in laying down and not moving.   Severe pain 5 Intense and extremely unpleasant. Associated with frowning face and frequent crying. Pain overwhelms the senses.  Ability to do any activity or maintain social relationships becomes significantly limited. Conversation becomes difficult. Pacing back and forth is common, as getting into a comfortable position is nearly impossible. Pain wakes you up from deep sleep. Physical signs will be obvious: pupillary dilation; increased sweating; goosebumps; brisk reflexes; cold, clammy hands and feet; nausea, vomiting or dry heaves; loss of appetite; significant sleep disturbance with inability to fall asleep or to remain asleep. When persistent, significant weight loss is observed due to the complete loss of appetite and sleep deprivation.  Blood pressure and heart rate becomes significantly elevated. Caution: If elevated blood pressure triggers a pounding headache associated with blurred vision, then the patient should immediately seek attention at an urgent or emergency care unit, as  these may be signs of an impending stroke.    Emergency Department Pain Levels (6-10/10)  Emergency Room Pain 6 Severely limiting. Requires emergency care and should not be seen or managed at an outpatient pain management facility. Communication becomes difficult and requires great effort. Assistance to reach the emergency department may be required. Facial flushing and profuse sweating along with potentially dangerous increases in heart rate and blood pressure  will be evident.   Distressing pain 7 Self-care is very difficult. Assistance is required to transport, or use restroom. Assistance to reach the emergency department will be required. Tasks requiring coordination, such as bathing and getting dressed become very difficult.   Disabling pain 8 Self-care is no longer possible. At this level, pain is disabling. The individual is unable to do even the most "basic" activities such as walking, eating, bathing, dressing, transferring to a bed, or toileting. Fine motor skills are lost. It is difficult to think clearly.   Incapacitating pain 9 Pain becomes incapacitating. Thought processing is no longer possible. Difficult to remember your own name. Control of movement and coordination are lost.   The worst pain imaginable 10 At this level, most patients pass out from pain. When this level is reached, collapse of the autonomic nervous system occurs, leading to a sudden drop in blood pressure and heart rate. This in turn results in a temporary and dramatic drop in blood flow to the brain, leading to a loss of consciousness. Fainting is one of the body's self defense mechanisms. Passing out puts the brain in a calmed state and causes it to shut down for a while, in order to begin the healing process.    Summary: 1.   Refer to this scale when providing [name] with your pain level. 2.   Be accurate and careful when reporting your pain level. This will help with your care. 3.   Over-reporting your pain level will lead to loss of credibility. 4.   Even a level of 1/10 means that there is pain and will be treated at our facility. 5.   High, inaccurate reporting will be documented as "Symptom Exaggeration", leading to loss of credibility and suspicions of possible secondary gains such as obtaining more narcotics, or wanting to appear disabled, for fraudulent reasons. 6.   Only pain levels of 5 or below will be seen at our facility. 7.   Pain levels of 6 and above will be  sent to the Emergency Department and the appointment cancelled.  ______________________________________________________________________________________________

## 2018-11-25 NOTE — Telephone Encounter (Signed)
Pt walked in; for 2 wks pt has had frequency of urine with burning upon urination. Pt has white vaginal discharge with itching. Perineal area is itching and swollen.hurts for pt to sit on perineal area. No fever.offered pt an appt this afternoon while here at office. Pt said her transportation could not wait and pt scheduled appt with [name] NP 11/26/18 at 2:20. FYI to [place].

## 2018-11-25 NOTE — Telephone Encounter (Signed)
Noted.  Will evaluate as scheduled.

## 2018-11-26 ENCOUNTER — Encounter: Payer: Self-pay | Admitting: Primary Care

## 2018-11-26 ENCOUNTER — Emergency Department: Payer: Medicare Other

## 2018-11-26 ENCOUNTER — Other Ambulatory Visit: Payer: Self-pay

## 2018-11-26 ENCOUNTER — Inpatient Hospital Stay
Admission: EM | Admit: 2018-11-26 | Discharge: 2018-11-27 | DRG: 872 | Disposition: A | Discharge status: Home / Self Care | Payer: Medicare Other | Source: Ambulatory Visit | Attending: Internal Medicine | Admitting: Internal Medicine

## 2018-11-26 ENCOUNTER — Encounter: Payer: Self-pay | Admitting: Emergency Medicine

## 2018-11-26 ENCOUNTER — Ambulatory Visit: Admission: RE | Admit: 2018-11-26 | Payer: Medicare Other | Source: Ambulatory Visit

## 2018-11-26 ENCOUNTER — Encounter: Payer: Self-pay | Admitting: Nurse Practitioner

## 2018-11-26 ENCOUNTER — Other Ambulatory Visit: Payer: Self-pay | Admitting: Nurse Practitioner

## 2018-11-26 ENCOUNTER — Ambulatory Visit (INDEPENDENT_AMBULATORY_CARE_PROVIDER_SITE_OTHER): Payer: Medicare Other | Admitting: Primary Care

## 2018-11-26 VITALS — BP 130/82 | HR 124 | Temp 98.1°F | Ht 63.0 in | Wt 119.5 lb

## 2018-11-26 DIAGNOSIS — E1122 Type 2 diabetes mellitus with diabetic chronic kidney disease: Secondary | ICD-10-CM | POA: Diagnosis present

## 2018-11-26 DIAGNOSIS — R700 Elevated erythrocyte sedimentation rate: Secondary | ICD-10-CM

## 2018-11-26 DIAGNOSIS — Z794 Long term (current) use of insulin: Secondary | ICD-10-CM | POA: Diagnosis not present

## 2018-11-26 DIAGNOSIS — I480 Paroxysmal atrial fibrillation: Secondary | ICD-10-CM | POA: Diagnosis present

## 2018-11-26 DIAGNOSIS — I10 Essential (primary) hypertension: Secondary | ICD-10-CM | POA: Diagnosis not present

## 2018-11-26 DIAGNOSIS — J209 Acute bronchitis, unspecified: Secondary | ICD-10-CM | POA: Diagnosis present

## 2018-11-26 DIAGNOSIS — F419 Anxiety disorder, unspecified: Secondary | ICD-10-CM | POA: Diagnosis not present

## 2018-11-26 DIAGNOSIS — N390 Urinary tract infection, site not specified: Secondary | ICD-10-CM | POA: Diagnosis present

## 2018-11-26 DIAGNOSIS — R059 Cough, unspecified: Secondary | ICD-10-CM

## 2018-11-26 DIAGNOSIS — K7460 Unspecified cirrhosis of liver: Secondary | ICD-10-CM | POA: Diagnosis present

## 2018-11-26 DIAGNOSIS — G894 Chronic pain syndrome: Secondary | ICD-10-CM | POA: Diagnosis present

## 2018-11-26 DIAGNOSIS — E1165 Type 2 diabetes mellitus with hyperglycemia: Secondary | ICD-10-CM | POA: Diagnosis not present

## 2018-11-26 DIAGNOSIS — K219 Gastro-esophageal reflux disease without esophagitis: Secondary | ICD-10-CM | POA: Diagnosis present

## 2018-11-26 DIAGNOSIS — F329 Major depressive disorder, single episode, unspecified: Secondary | ICD-10-CM | POA: Diagnosis not present

## 2018-11-26 DIAGNOSIS — Z90710 Acquired absence of both cervix and uterus: Secondary | ICD-10-CM | POA: Diagnosis not present

## 2018-11-26 DIAGNOSIS — E1143 Type 2 diabetes mellitus with diabetic autonomic (poly)neuropathy: Secondary | ICD-10-CM | POA: Diagnosis present

## 2018-11-26 DIAGNOSIS — N183 Chronic kidney disease, stage 3 (moderate): Secondary | ICD-10-CM | POA: Diagnosis present

## 2018-11-26 DIAGNOSIS — R350 Frequency of micturition: Secondary | ICD-10-CM | POA: Diagnosis not present

## 2018-11-26 DIAGNOSIS — F341 Dysthymic disorder: Secondary | ICD-10-CM | POA: Diagnosis present

## 2018-11-26 DIAGNOSIS — Z8249 Family history of ischemic heart disease and other diseases of the circulatory system: Secondary | ICD-10-CM | POA: Diagnosis not present

## 2018-11-26 DIAGNOSIS — K3184 Gastroparesis: Secondary | ICD-10-CM | POA: Diagnosis present

## 2018-11-26 DIAGNOSIS — Z8542 Personal history of malignant neoplasm of other parts of uterus: Secondary | ICD-10-CM

## 2018-11-26 DIAGNOSIS — Z87891 Personal history of nicotine dependence: Secondary | ICD-10-CM | POA: Diagnosis not present

## 2018-11-26 DIAGNOSIS — R05 Cough: Secondary | ICD-10-CM | POA: Diagnosis not present

## 2018-11-26 DIAGNOSIS — Z7901 Long term (current) use of anticoagulants: Secondary | ICD-10-CM | POA: Diagnosis not present

## 2018-11-26 DIAGNOSIS — R7982 Elevated C-reactive protein (CRP): Secondary | ICD-10-CM

## 2018-11-26 DIAGNOSIS — Z79899 Other long term (current) drug therapy: Secondary | ICD-10-CM

## 2018-11-26 DIAGNOSIS — N179 Acute kidney failure, unspecified: Secondary | ICD-10-CM | POA: Diagnosis present

## 2018-11-26 DIAGNOSIS — N1831 Chronic kidney disease, stage 3a: Secondary | ICD-10-CM | POA: Diagnosis present

## 2018-11-26 DIAGNOSIS — R000 Tachycardia, unspecified: Secondary | ICD-10-CM | POA: Diagnosis not present

## 2018-11-26 DIAGNOSIS — I129 Hypertensive chronic kidney disease with stage 1 through stage 4 chronic kidney disease, or unspecified chronic kidney disease: Secondary | ICD-10-CM | POA: Diagnosis not present

## 2018-11-26 DIAGNOSIS — E559 Vitamin D deficiency, unspecified: Secondary | ICD-10-CM | POA: Insufficient documentation

## 2018-11-26 DIAGNOSIS — A419 Sepsis, unspecified organism: Secondary | ICD-10-CM | POA: Diagnosis not present

## 2018-11-26 DIAGNOSIS — E785 Hyperlipidemia, unspecified: Secondary | ICD-10-CM | POA: Diagnosis not present

## 2018-11-26 DIAGNOSIS — E1149 Type 2 diabetes mellitus with other diabetic neurological complication: Secondary | ICD-10-CM | POA: Diagnosis present

## 2018-11-26 LAB — POC URINALSYSI DIPSTICK (AUTOMATED)
Bilirubin, UA: NEGATIVE
Glucose, UA: POSITIVE — AB
Nitrite, UA: NEGATIVE
PROTEIN UA: POSITIVE — AB
Spec Grav, UA: >=1.03 — AB (ref 1.010–1.025)
UROBILINOGEN UA: 0.2 U/dL
pH, UA: 5.5 (ref 5.0–8.0)

## 2018-11-26 LAB — TROPONIN I: Troponin I: <0.03 ng/mL (ref <0.03)

## 2018-11-26 LAB — URINALYSIS, COMPLETE (UACMP) WITH MICROSCOPIC
Bacteria, UA: NONE SEEN
Bilirubin Urine: NEGATIVE
Glucose, UA: >=500 mg/dL — AB
Ketones, ur: 5 mg/dL — AB
Nitrite: NEGATIVE
PROTEIN: 100 mg/dL — AB
RBC / HPF: >50 RBC/hpf — ABNORMAL HIGH (ref 0–5)
Specific Gravity, Urine: 1.021 (ref 1.005–1.030)
WBC, UA: >50 WBC/hpf — ABNORMAL HIGH (ref 0–5)
pH: 5 (ref 5.0–8.0)

## 2018-11-26 LAB — CBC
HCT: 33.4 % — ABNORMAL LOW (ref 36.0–46.0)
Hemoglobin: 10.4 g/dL — ABNORMAL LOW (ref 12.0–15.0)
MCH: 26 pg (ref 26.0–34.0)
MCHC: 31.1 g/dL (ref 30.0–36.0)
MCV: 83.5 fL (ref 80.0–100.0)
Platelets: 245 10*3/uL (ref 150–400)
RBC: 4 MIL/uL (ref 3.87–5.11)
RDW: 14 % (ref 11.5–15.5)
WBC: 8.5 10*3/uL (ref 4.0–10.5)
nRBC: 0 % (ref 0.0–0.2)

## 2018-11-26 LAB — BASIC METABOLIC PANEL
Anion gap: 12 (ref 5–15)
BUN: 18 mg/dL (ref 8–23)
CHLORIDE: 98 mmol/L (ref 98–111)
CO2: 20 mmol/L — AB (ref 22–32)
Calcium: 8.4 mg/dL — ABNORMAL LOW (ref 8.9–10.3)
Creatinine, Ser: 1.14 mg/dL — ABNORMAL HIGH (ref 0.44–1.00)
GFR calc Af Amer: 59 mL/min — ABNORMAL LOW (ref >60)
GFR calc non Af Amer: 51 mL/min — ABNORMAL LOW (ref >60)
Glucose, Bld: 317 mg/dL — ABNORMAL HIGH (ref 70–99)
Potassium: 3.1 mmol/L — ABNORMAL LOW (ref 3.5–5.1)
Sodium: 130 mmol/L — ABNORMAL LOW (ref 135–145)

## 2018-11-26 LAB — LACTIC ACID, PLASMA
LACTIC ACID, VENOUS: 2 mmol/L — AB (ref 0.5–1.9)
Lactic Acid, Venous: 1.6 mmol/L (ref 0.5–1.9)

## 2018-11-26 LAB — PROTIME-INR
INR: 1.3 — ABNORMAL HIGH (ref 0.8–1.2)
Prothrombin Time: 16.2 seconds — ABNORMAL HIGH (ref 11.4–15.2)

## 2018-11-26 LAB — VITAMIN D 25 HYDROXY (VIT D DEFICIENCY, FRACTURES): Vit D, 25-Hydroxy: 4.7 ng/mL — ABNORMAL LOW (ref 30.0–100.0)

## 2018-11-26 LAB — GLUCOSE, CAPILLARY: Glucose-Capillary: 199 mg/dL — ABNORMAL HIGH (ref 70–99)

## 2018-11-26 MED ORDER — INSULIN GLARGINE 100 UNIT/ML SOLOSTAR PEN: 25.0000 [IU] | PEN_INJECTOR | Freq: Every day | SUBCUTANEOUS | 5 refills | Status: DC

## 2018-11-26 MED ORDER — SODIUM CHLORIDE 0.9 % IV BOLUS: 1000.0000 mL | Freq: Once | INTRAVENOUS | Status: DC

## 2018-11-26 MED ORDER — MORPHINE SULFATE (PF) 2 MG/ML IV SOLN
2.0000 mg | INTRAVENOUS | Status: DC | PRN
  Administered 2018-11-27: 2 mg via INTRAVENOUS
  Filled 2018-11-26: qty 1

## 2018-11-26 MED ORDER — SODIUM CHLORIDE 0.9 % IV SOLN
1.0000 g | Freq: Once | INTRAVENOUS | Status: AC
  Administered 2018-11-26: 1 g via INTRAVENOUS
  Filled 2018-11-26: qty 10

## 2018-11-26 MED ORDER — APIXABAN 5 MG PO TABS
5.0000 mg | ORAL_TABLET | Freq: Two times a day (BID) | ORAL | Status: DC
  Administered 2018-11-26 – 2018-11-27 (×2): 5 mg via ORAL
  Filled 2018-11-26 (×2): qty 1

## 2018-11-26 MED ORDER — INSULIN ASPART 100 UNIT/ML ~~LOC~~ SOLN
0.0000 [IU] | Freq: Three times a day (TID) | SUBCUTANEOUS | Status: DC
  Administered 2018-11-27 (×2): 2 [IU] via SUBCUTANEOUS
  Filled 2018-11-26 (×2): qty 1

## 2018-11-26 MED ORDER — SODIUM CHLORIDE 0.9% FLUSH: 3.0000 mL | Freq: Once | INTRAVENOUS | Status: DC

## 2018-11-26 MED ORDER — ATORVASTATIN CALCIUM 20 MG PO TABS
40.0000 mg | ORAL_TABLET | Freq: Every day | ORAL | Status: DC
  Administered 2018-11-27: 40 mg via ORAL
  Filled 2018-11-26: qty 2

## 2018-11-26 MED ORDER — FENTANYL CITRATE (PF) 100 MCG/2ML IJ SOLN
50.0000 ug | Freq: Once | INTRAMUSCULAR | Status: AC
  Administered 2018-11-26: 50 ug via INTRAVENOUS
  Filled 2018-11-26: qty 2

## 2018-11-26 MED ORDER — SODIUM CHLORIDE 0.9 % IV SOLN
1.0000 g | INTRAVENOUS | Status: DC
  Filled 2018-11-26: qty 10

## 2018-11-26 MED ORDER — METOPROLOL TARTRATE 25 MG PO TABS
12.5000 mg | ORAL_TABLET | Freq: Two times a day (BID) | ORAL | Status: DC
  Administered 2018-11-26 – 2018-11-27 (×2): 12.5 mg via ORAL
  Filled 2018-11-26 (×2): qty 1

## 2018-11-26 MED ORDER — ACETAMINOPHEN 325 MG PO TABS: 650.0000 mg | ORAL_TABLET | Freq: Four times a day (QID) | ORAL | Status: DC | PRN

## 2018-11-26 MED ORDER — OXYCODONE HCL 5 MG PO TABS
5.0000 mg | ORAL_TABLET | Freq: Four times a day (QID) | ORAL | Status: DC | PRN
  Administered 2018-11-26 – 2018-11-27 (×2): 5 mg via ORAL
  Filled 2018-11-26 (×2): qty 1

## 2018-11-26 MED ORDER — INSULIN ASPART 100 UNIT/ML ~~LOC~~ SOLN: 0.0000 [IU] | Freq: Every day | SUBCUTANEOUS | Status: DC

## 2018-11-26 MED ORDER — SULFAMETHOXAZOLE-TRIMETHOPRIM 800-160 MG PO TABS: 1.0000 | ORAL_TABLET | Freq: Two times a day (BID) | ORAL | 0 refills | Status: DC

## 2018-11-26 MED ORDER — PROCHLORPERAZINE EDISYLATE 10 MG/2ML IJ SOLN
5.0000 mg | INTRAMUSCULAR | Status: DC | PRN
  Filled 2018-11-26: qty 1

## 2018-11-26 MED ORDER — ACETAMINOPHEN 650 MG RE SUPP: 650.0000 mg | Freq: Four times a day (QID) | RECTAL | Status: DC | PRN

## 2018-11-26 MED ORDER — INSULIN GLARGINE 100 UNIT/ML ~~LOC~~ SOLN
15.0000 [IU] | Freq: Every day | SUBCUTANEOUS | Status: DC
  Administered 2018-11-26: 15 [IU] via SUBCUTANEOUS
  Filled 2018-11-26 (×2): qty 0.15

## 2018-11-26 NOTE — ED Triage Notes (Signed)
First RN: Pt presents to ED from [facility], per [place] care pt has tan and cloudy urine and cough, RN states concerns for sepsis at this time. Pt presents to ED wearing mask at this time.

## 2018-11-26 NOTE — Assessment & Plan Note (Signed)
Home glucose readings of 250-300's. Discussed that she MUST eat when taking insulin, she verbalized understanding. Increase Lantus to 25 units, continue glucose readings. Follow up as scheduled with glucose logs.

## 2018-11-26 NOTE — Progress Notes (Signed)
Subjective:    Patient ID: [name], female    DOB: 07-Dec-1955, 63 y.o.   MRN: [id number]  HPI  [name] is a 63 year old female with numerous medical problems including uncontrolled diabetes, CKD, CAD, atrial fibrillation who presents today with multiple complaints.  1) Urinary Frequency: She's urinating every hour to hour and a half. Also with whitish vaginal discharge, vaginal swelling, changes in color to her urine to a dark tan color, weakness. She denies fevers, hematuria. This began about one week ago. She's been drinking flavored water without vomiting.   BP Readings from Last 3 Encounters:  11/26/18 130/82  11/25/18 109/73  11/17/18 124/84     2) Cough: Present for the last one week. Deep cough, no appetite and hasn't eaten anything in 9 days. Denies fevers, productive cough, recent travel. She's not taking anything for her symptoms.   3) Type 2 Diabetes: She is injecting 20 units of Lantus nightly. She is checking her glucose three times daily:  AM Fasting: 305 today, 250-300 over the last several days Before Dinner: low 200's Bedtime: low 300's  She is not eating much due to decrease in appetite.   Review of Systems  Constitutional: Positive for fatigue. Negative for fever.  Respiratory: Positive for cough.   Genitourinary: Positive for dysuria, frequency and vaginal discharge. Negative for flank pain and hematuria.       Past Medical History:  Diagnosis Date  . Acute encephalopathy 05/22/2018  . Allergy   . Anxiety   . Ascites   . C. difficile colitis 07/10/2015  . Cancer (HCC)    HX OF CANCER OF UTERUS   . Cirrhosis of liver not due to alcohol ([place]) 2016  . Degenerative disk disease   . Diverticulitis   . Gastroparesis   . GERD (gastroesophageal reflux disease)   . History of hiatal hernia   . Hypertension   . Hypothyroid   . Hypothyroidism due to amiodarone   . Ileus ([place]) 08/01/2015  . Intussusception intestine ([place]) 05/2015  . Orthostatic  hypotension   . PAF (paroxysmal atrial fibrillation) ([place]) 03/2015   a. new onset 03/2015 in setting of intractable N/V; b. on Eliquis 5 mg bid; c. CHADSVASc 4 (DM, TIA x 2, female)  . Pancreatitis   . Pneumonia 11/14/2015  . Right ureteral stone 07/14/2016  . Sick sinus syndrome ([place])   . Stomach ulcer   . Stroke [facility])    with minimal left sided weakness  . Syncope 01/2015  . Syncope due to orthostatic hypotension 05/18/2015  . Tachyarrhythmia 01/10/2016  . TIA (transient ischemic attack) 02/2015  . Type 1 diabetes ([place])    on levemir  . UTERINE CANCER, HX OF 03/27/2007   Qualifier: Diagnosis of  By: Maxie Better FNP, Rosalita Levan   . UTI (urinary tract infection) 05/22/2018     Social History   Socioeconomic History  . Marital status: Married    Spouse name: Not on file  . Number of children: Not on file  . Years of education: Not on file  . Highest education level: Not on file  Occupational History  . Occupation: Disabled 2nd back problems  Social Needs  . Financial resource strain: Not on file  . Food insecurity:    Worry: Not on file    Inability: Not on file  . Transportation needs:    Medical: Not on file    Non-medical: Not on file  Tobacco Use  . Smoking status: Former Smoker  Types: Cigarettes  . Smokeless tobacco: Never Used  . Tobacco comment: 25 years ago and only smoked occasionally  Substance and Sexual Activity  . Alcohol use: No  . Drug use: No  . Sexual activity: Not Currently  Lifestyle  . Physical activity:    Days per week: Not on file    Minutes per session: Not on file  . Stress: Not on file  Relationships  . Social connections:    Talks on phone: Not on file    Gets together: Not on file    Attends religious service: Not on file    Active member of club or organization: Not on file    Attends meetings of clubs or organizations: Not on file    Relationship status: Not on file  . Intimate partner violence:    Fear of current or ex partner: Not  on file    Emotionally abused: Not on file    Physically abused: Not on file    Forced sexual activity: Not on file  Other Topics Concern  . Not on file  Social History Narrative   Lives in [place], [place] with her husband and 2 sons.    Past Surgical History:  Procedure Laterality Date  . ABDOMINAL HYSTERECTOMY    . CARDIAC CATHETERIZATION N/A 01/12/2016   Procedure: Left Heart Cath and Coronary Angiography;  Surgeon: Wellington Hampshire, MD;  Location: [facility];  Service: Cardiovascular;  Laterality: N/A;  . CHOLECYSTECTOMY    . CYSTOSCOPY/URETEROSCOPY/HOLMIUM LASER Right 07/14/2016   Procedure: CYSTOSCOPY/URETEROSCOPY/HOLMIUM LASER;  Surgeon: Alexis Frock, MD;  Location: [facility] ORS;  Service: Urology;  Laterality: Right;  . ESOPHAGOGASTRODUODENOSCOPY N/A 04/04/2015   Procedure: ESOPHAGOGASTRODUODENOSCOPY (EGD);  Surgeon: Hulen Luster, MD;  Location[facility] END[facility]COPY;  Service: Endoscopy;  Laterality: N/A;  . ESOPHAGOGASTRODUODENOSCOPY N/A 12/28/2017   Procedure: ESOPHAGOGASTRODUODENOSCOPY (EGD);  Surgeon: Lin Landsman, MD;  Location: [facility] ENDOSCOPY;  Service: Gastroenterology;  Laterality: N/A;  . ESOPHAGOGASTRODUODENOSCOPY (EGD) WITH PROPOFOL N/A 01/18/2016   Procedure: ESOPHAGOGASTRODUODENOSCOPY (EGD) WITH PROPOFOL;  Surgeon: Lucilla Lame, MD;  Location: [facility] ENDOSCOPY;  Service: Endoscopy;  Laterality: N/A;  . FLEXIBLE SIGMOIDOSCOPY N/A 01/18/2016   Procedure: FLEXIBLE SIGMOIDOSCOPY;  Surgeon: Lucilla Lame, MD;  Location: [facility] ENDOSCOPY;  Service: Endoscopy;  Laterality: N/A;  . HERNIA REPAIR      Family History  Problem Relation Age of Onset  . Hypertension Mother   . CAD Sister   . Heart attack Sister        Deceased Oct 28, 2014  . CAD Brother     Allergies  Allergen Reactions  . Hydrocodone Other (See Comments)    Pt states that this medication caused cirrhosis of the liver.    . Aspirin   . Erythromycin Other (See Comments)    Reaction:  Fever   . Prednisone Other  (See Comments)    Reaction:  Unknown   . Rosiglitazone Maleate Swelling  . Codeine Sulfate Rash  . Tetanus-Diphtheria Toxoids Td Rash and Other (See Comments)    Reaction:  Fever     Current Outpatient Medications on File Prior to Visit  Medication Sig Dispense Refill  . acetaminophen (TYLENOL) 325 MG tablet Take 650 mg by mouth every 6 (six) hours as needed.    Marland Kitchen apixaban (ELIQUIS) 5 MG TABS tablet Take 5 mg by mouth 2 (two) times daily.    Marland Kitchen atorvastatin (LIPITOR) 40 MG tablet Take 40 mg by mouth daily.    . Blood Glucose Monitoring Suppl (CONTOUR BLOOD  GLUCOSE SYSTEM) w/Device KIT Use to test blood sugar 3 times daily. Dx is E11.65 1 each 0  . glucose blood test strip Contour Test Strips. Use as instructed to test blood sugar 3 times daily. 100 each 2  . ibuprofen (ADVIL,MOTRIN) 200 MG tablet Take 200 mg by mouth every 6 (six) hours as needed.    . Insulin Glargine (LANTUS SOLOSTAR) 100 UNIT/ML Solostar Pen Inject 20 Units into the skin at bedtime. 5 pen 5  . Lancet Device MISC Use as instructed to test blood sugar 3 times daily. Dx is E11.65 100 each 2  . metoprolol tartrate (LOPRESSOR) 25 MG tablet Take 0.5 tablets (12.5 mg total) by mouth 2 (two) times daily. For heart rate. 90 tablet 3   No current facility-administered medications on file prior to visit.     BP 130/82   Pulse (!) 124   Temp 98.1 F (36.7 C) (Oral)   Ht _0  (1.6 m)   Wt 119 lb 8 oz (54.2 kg)   SpO2 98%   BMI 21.17 kg/m    Objective:   Physical Exam  Constitutional: She has a sickly appearance. She appears ill.  Appears weak  HENT:  Right Ear: Tympanic membrane and ear canal normal.  Left Ear: Tympanic membrane and ear canal normal.  Mouth/Throat: Oropharynx is clear and moist.  Respiratory: No respiratory distress. She has no decreased breath sounds. She has no wheezes. She has rhonchi in the right lower field.           Assessment & Plan:  UTI vs Urosepsis: Cough:  Urinary symptoms x one  week, also with changes in urine color. History of urosepsis. Appears weak. Baseline tachycardia.  Cough with rhonchi to right lower field. Really needs IV fluids, labs, and likely IV antibiotics. Also chest xray. Given this I recommended she go to the nearest emergency department. She initially refused but then agreed to go. She has a driver with her today who will take her to [facility]. We will call the triage nurse with report.  Labs and xray today cancelled. [name], NP

## 2018-11-26 NOTE — ED Notes (Signed)
Date and time results received: 11/26/18 1640 (use smartphrase ".now" to insert current time)  Test: lactic acid Critical Value: 2.0  Name of Provider Notified: Archie Balboa  Orders Received? Or Actions Taken?: repeat lactic

## 2018-11-26 NOTE — ED Triage Notes (Signed)
Pt to ED from home sent over by PCP concerns of UTI, cough and possible pneumonia.  Pt states cough x1 week, non productive, SOB.  Burning with urination, white discharge x1 week.  States has not had an appetite and unable to eat last 10 days with 10lb weight loss.  Pt with cough but lungs sound clear, skin WNL.

## 2018-11-26 NOTE — H&P (Signed)
[facility] [place] NAME: [name]    MR#:  [id number]  DATE OF BIRTH:  04-25-1956  DATE OF ADMISSION:  11/26/2018  PRIMARY CARE PHYSICIAN: [name], NP   REQUESTING/REFERRING PHYSICIAN: Archie Balboa, MD  CHIEF COMPLAINT:   Chief Complaint  Patient presents with  . Recurrent UTI  . Cough    HISTORY OF PRESENT ILLNESS:  [name]  is a 63 y.o. female who presents with chief complaint as above.  Patient presents to the ED with a complaint of 10 days of worsening dysuria.  She then developed nausea, and has been unable to eat or drink anything significantly for the past 5 or 6 days.  On presentation to the ED she was tachycardic and has a leukocytosis, meeting sepsis criteria.  UA is consistent with UTI.  Hospitalist were called for admission  PAST MEDICAL HISTORY:   Past Medical History:  Diagnosis Date  . Acute encephalopathy 05/22/2018  . Allergy   . Anxiety   . Ascites   . C. difficile colitis 07/10/2015  . Cancer (HCC)    HX OF CANCER OF UTERUS   . Cirrhosis of liver not due to alcohol ([place]) 2016  . Degenerative disk disease   . Diverticulitis   . Gastroparesis   . GERD (gastroesophageal reflux disease)   . History of hiatal hernia   . Hypertension   . Hypothyroid   . Hypothyroidism due to amiodarone   . Ileus ([place]) 08/01/2015  . Intussusception intestine ([place]) 05/2015  . Orthostatic hypotension   . PAF (paroxysmal atrial fibrillation) ([place]) 03/2015   a. new onset 03/2015 in setting of intractable N/V; b. on Eliquis 5 mg bid; c. CHADSVASc 4 (DM, TIA x 2, female)  . Pancreatitis   . Pneumonia 11/14/2015  . Right ureteral stone 07/14/2016  . Sick sinus syndrome ([place])   . Stomach ulcer   . Stroke [facility])    with minimal left sided weakness  . Syncope 01/2015  . Syncope due to orthostatic hypotension 05/18/2015  . Tachyarrhythmia 01/10/2016  . TIA (transient ischemic attack) 02/2015  . Type 1 diabetes ([place])     on levemir  . UTERINE CANCER, HX OF 03/27/2007   Qualifier: Diagnosis of  By: Maxie Better FNP, Rosalita Levan   . UTI (urinary tract infection) 05/22/2018     PAST SURGICAL HISTORY:   Past Surgical History:  Procedure Laterality Date  . ABDOMINAL HYSTERECTOMY    . CARDIAC CATHETERIZATION N/A 01/12/2016   Procedure: Left Heart Cath and Coronary Angiography;  Surgeon: Wellington Hampshire, MD;  Location: [place] [facility];  Service: Cardiovascular;  Laterality: N/A;  . CHOLECYSTECTOMY    . CYSTOSCOPY/URETEROSCOPY/HOLMIUM LASER Right 07/14/2016   Procedure: CYSTOSCOPY/URETEROSCOPY/HOLMIUM LASER;  Surgeon: Alexis Frock, MD;  Location: [facility] ORS;  Service: Urology;  Laterality: Right;  . ESOPHAGOGASTRODUODENOSCOPY N/A 04/04/2015   Procedure: ESOPHAGOGASTRODUODENOSCOPY (EGD);  Surgeon: Hulen Luster, MD;  Location: [facility] ENDOSCOPY;  Service: Endoscopy;  Laterality: N/A;  . ESOPHAGOGASTRODUODENOSCOPY N/A 12/28/2017   Procedure: ESOPHAGOGASTRODUODENOSCOPY (EGD);  Surgeon: Lin Landsman, MD;  Location[facility] END[facility]COPY;  Service: Gastroenterology;  Laterality: N/A;  . ESOPHAGOGASTRODUODENOSCOPY (EGD) WITH PROPOFOL N/A 01/18/2016   Procedure: ESOPHAGOGASTRODUODENOSCOPY (EGD) WITH PROPOFOL;  Surgeon: Lucilla Lame, MD;  Location: [facility] ENDOSCOPY;  Service: Endoscopy;  Laterality: N/A;  . FLEXIBLE SIGMOIDOSCOPY N/A 01/18/2016   Procedure: FLEXIBLE SIGMOIDOSCOPY;  Surgeon: Lucilla Lame, MD;  Location: [facility] ENDOSCOPY;  Service: Endoscopy;  Laterality: N/A;  . HERNIA  REPAIR       SOCIAL HISTORY:   Social History   Tobacco Use  . Smoking status: Former Smoker    Types: Cigarettes  . Smokeless tobacco: Never Used  . Tobacco comment: 25 years ago and only smoked occasionally  Substance Use Topics  . Alcohol use: No     FAMILY HISTORY:   Family History  Problem Relation Age of Onset  . Hypertension Mother   . CAD Sister   . Heart attack Sister        Deceased 11/21/2014  . CAD Brother      DRUG  ALLERGIES:   Allergies  Allergen Reactions  . Hydrocodone Other (See Comments)    Pt states that this medication caused cirrhosis of the liver.    . Aspirin   . Erythromycin Other (See Comments)    Reaction:  Fever   . Prednisone Other (See Comments)    Reaction:  Unknown   . Rosiglitazone Maleate Swelling  . Codeine Sulfate Rash  . Tetanus-Diphtheria Toxoids Td Rash and Other (See Comments)    Reaction:  Fever     MEDICATIONS AT HOME:   Prior to Admission medications   Medication Sig Start Date End Date Taking? Authorizing Provider  acetaminophen (TYLENOL) 325 MG tablet Take 650 mg by mouth every 6 (six) hours as needed.   Yes [provider]  apixaban (ELIQUIS) 5 MG TABS tablet Take 5 mg by mouth 2 (two) times daily.   Yes [provider]  atorvastatin (LIPITOR) 40 MG tablet Take 40 mg by mouth daily.   Yes [provider]  Insulin Glargine (LANTUS SOLOSTAR) 100 UNIT/ML Solostar Pen Inject 25 Units into the skin at bedtime. 11/26/18  Yes [name], NP  metoprolol tartrate (LOPRESSOR) 25 MG tablet Take 0.5 tablets (12.5 mg total) by mouth 2 (two) times daily. For heart rate. 11/17/18 02/15/19 Yes [name], NP  Blood Glucose Monitoring Suppl (CONTOUR BLOOD GLUCOSE SYSTEM) w/Device KIT Use to test blood sugar 3 times daily. Dx is E11.65 08/28/18   [name], NP  glucose blood test strip Contour Test Strips. Use as instructed to test blood sugar 3 times daily. 08/28/18   [name], NP  ibuprofen (ADVIL,MOTRIN) 200 MG tablet Take 200 mg by mouth every 6 (six) hours as needed.    [provider]  Lancet Device MISC Use as instructed to test blood sugar 3 times daily. Dx is E11.65 08/28/18   [name], NP    REVIEW OF SYSTEMS:  Review of Systems  Constitutional: Positive for malaise/fatigue. Negative for chills, fever and weight loss.  HENT: Negative for ear pain, hearing loss and tinnitus.   Eyes: Negative  for blurred vision, double vision, pain and redness.  Respiratory: Negative for cough, hemoptysis and shortness of breath.   Cardiovascular: Negative for chest pain, palpitations, orthopnea and leg swelling.  Gastrointestinal: Positive for nausea. Negative for abdominal pain, constipation, diarrhea and vomiting.  Genitourinary: Positive for dysuria. Negative for frequency and hematuria.  Musculoskeletal: Negative for back pain, joint pain and neck pain.  Skin:       No acne, rash, or lesions  Neurological: Negative for dizziness, tremors, focal weakness and weakness.  Endo/Heme/Allergies: Negative for polydipsia. Does not bruise/bleed easily.  Psychiatric/Behavioral: Negative for depression. The patient is not nervous/anxious and does not have insomnia.      VITAL SIGNS:   Vitals:   11/26/18 1527 11/26/18 1528 11/26/18 1604  BP: 90/62  98/84  Pulse: (!) 125  (!) 118  Resp: 18  (!) 23  Temp: 98.4 F (36.9 C)    TempSrc: Oral    SpO2: 99%  98%  Weight:  54 kg   Height:  5' 3"  (1.6 m)    Wt Readings from Last 3 Encounters:  11/26/18 54 kg  11/26/18 54.2 kg  11/25/18 59 kg    PHYSICAL EXAMINATION:  Physical Exam  Vitals reviewed. Constitutional: She is oriented to person, place, and time. She appears well-developed and well-nourished. No distress.  HENT:  Head: Normocephalic and atraumatic.  Dry mucous membranes  Eyes: Pupils are equal, round, and reactive to light. Conjunctivae and EOM are normal. No scleral icterus.  Neck: Normal range of motion. Neck supple. No JVD present. No thyromegaly present.  Cardiovascular: Regular rhythm and intact distal pulses. Exam reveals no gallop and no friction rub.  No murmur heard. Tachycardic  Respiratory: Effort normal and breath sounds normal. No respiratory distress. She has no wheezes. She has no rales.  GI: Soft. Bowel sounds are normal. She exhibits no distension. There is abdominal tenderness (Suprapubic).  Musculoskeletal:  Normal range of motion.        General: No edema.     Comments: No arthritis, no gout  Lymphadenopathy:    She has no cervical adenopathy.  Neurological: She is alert and oriented to person, place, and time. No cranial nerve deficit.  No dysarthria, no aphasia  Skin: Skin is warm and dry. No rash noted. No erythema.  Psychiatric: She has a normal mood and affect. Her behavior is normal. Judgment and thought content normal.    LABORATORY PANEL:   CBC Recent Labs  Lab 11/26/18 1555  WBC 8.5  HGB 10.4*  HCT 33.4*  PLT 245   ------------------------------------------------------------------------------------------------------------------  Chemistries  Recent Labs  Lab 11/25/18 1504 11/26/18 1555  NA 129* 130*  K 3.3* 3.1*  CL 98 98  CO2 19* 20*  GLUCOSE 411* 317*  BUN 16 18  CREATININE 1.21* 1.14*  CALCIUM 8.1* 8.4*  MG 1.8  --   AST 26  --   ALT 21  --   ALKPHOS 110  --   BILITOT 0.7  --    ------------------------------------------------------------------------------------------------------------------  Cardiac Enzymes Recent Labs  Lab 11/26/18 1555  TROPONINI <0.03   ------------------------------------------------------------------------------------------------------------------  RADIOLOGY:  Dg Chest 2 View  Result Date: 11/26/2018 CLINICAL DATA:  Cough. EXAM: CHEST - 2 VIEW COMPARISON:  Chest x-ray dated August 18, 2018. FINDINGS: The heart size and mediastinal contours are within normal limits. Normal pulmonary vascularity. Atherosclerotic calcification of the aortic arch. Streaky linear opacities in both lower lobes. No focal consolidation, pleural effusion, or pneumothorax. No acute osseous abnormality. IMPRESSION: 1. Bilateral lower lobe streaky linear opacities, favoring atelectasis. Electronically Signed   By: Titus Dubin M.D.   On: 11/26/2018 16:53    EKG:   Orders placed or performed during the hospital encounter of 11/26/18  . ED EKG  . ED  EKG    IMPRESSION AND PLAN:  Principal Problem:   Sepsis ([place]) -lactic acid was initially elevated, but after fluid administration came to within normal limits.  Source of sepsis is urinary tract infection.  Blood pressure stable, IV antibiotics initiated, cultures sent Active Problems:   UTI (urinary tract infection) -IV antibiotics, urine culture sent   Essential hypertension -continue home meds   PAF (paroxysmal atrial fibrillation) ([place]) -continue home medications including anticoagulation   Type 2 diabetes mellitus with neurologic complication ([place]) -  sliding scale insulin   GERD -home dose PPI   CKD (chronic kidney disease), stage III (HCC) -at baseline, avoid nephrotoxins and monitor   Hyperlipidemia -home dose antilipid  Chart review performed and case discussed with ED provider. Labs, imaging and/or ECG reviewed by provider and discussed with patient/family. Management plans discussed with the patient and/or family.  DVT PROPHYLAXIS: Systemic anticoagulation  GI PROPHYLAXIS:  PPI   ADMISSION STATUS: Inpatient     CODE STATUS: Full Code Status History    Date Active Date Inactive Code Status Order ID Comments User Context   09/25/2018 0922 09/26/2018 2028 Full Code [id number]  Gorden Harms, MD Inpatient   08/18/2018 2234 08/20/2018 1923 DNR [id number]  Rise Patience, MD ED   08/18/2018 2136 08/18/2018 2234 Full Code [id number]  Rise Patience, MD ED   05/22/2018 1101 05/24/2018 1850 Full Code [id number]  Molt, [place], DO ED   12/27/2017 1245 12/30/2017 2040 Full Code [id number]  Demetrios Loll, MD Inpatient   12/15/2017 0501 12/17/2017 1854 Full Code [id number]  Harrie Foreman, MD Inpatient   08/06/2016 2036 08/09/2016 1945 Full Code [id number]  Vaughan Basta, MD ED   07/14/2016 0426 07/17/2016 1934 Full Code [id number]  Harrie Foreman, MD Inpatient   07/09/2016 0453 07/10/2016 1708 Full Code [id number]  Lance Coon, MD Inpatient   06/17/2016 2245 06/20/2016 1509  Full Code [id number]  Lance Coon, MD Inpatient   06/02/2016 0546 06/06/2016 1821 DNR [id number]  Harrie Foreman, MD Inpatient   05/12/2016 1143 05/16/2016 1638 DNR [id number]  Dustin Flock, MD Inpatient   05/12/2016 0113 05/12/2016 1143 Full Code [id number]  Robbins, [place], DO Inpatient   04/07/2016 0618 04/09/2016 1419 DNR [id number]  Harrie Foreman, MD Inpatient   03/13/2016 2125 03/16/2016 1832 DNR [id number]  Lance Coon, MD Inpatient   02/04/2016 0939 02/07/2016 1459 DNR [id number]  Saundra Shelling, MD Inpatient   01/21/2016 2224 01/25/2016 1702 DNR [id number]  Lance Coon, MD Inpatient   01/15/2016 1339 01/19/2016 1529 DNR [id number]  Gladstone Lighter, MD ED   01/10/2016 2200 01/13/2016 1724 DNR [id number]  Harrie Foreman, MD Inpatient   12/27/2015 2302 01/01/2016 1817 DNR [id number]  Max Sane, MD Inpatient   12/16/2015 1654 12/19/2015 1652 DNR [id number]  Loletha Grayer, MD ED   11/11/2015 1136 11/16/2015 1405 Full Code [id number]  Samella Parr, NP Inpatient   10/15/2015 0123 10/16/2015 1817 Full Code [id number]  Lance Coon, MD Inpatient   10/04/2015 2019 10/08/2015 1834 Full Code [id number]  Bethena Roys, MD Inpatient   08/15/2015 0105 08/16/2015 2004 Full Code [id number]  Harrie Foreman, MD Inpatient   08/01/2015 0125 08/06/2015 1741 Full Code [id number]  Harrie Foreman, MD Inpatient   07/04/2015 0253 07/11/2015 1833 Full Code [id number]  Lytle Butte, MD ED   05/27/2015 2248 05/31/2015 1437 Full Code [id number]  Rise Patience, MD Inpatient   05/19/2015 0114 05/22/2015 1628 Full Code [id number]  Rise Patience, MD Inpatient   04/05/2015 0916 04/07/2015 1723 Full Code [id number]  Bettey Costa, MD Inpatient   04/03/2015 1231 04/05/2015 0916 Full Code [id number]  Bettey Costa, MD Inpatient   03/11/2015 1649 03/14/2015 [place] Full Code [id number]  Dustin Flock, MD Inpatient   02/15/2015 1651 02/17/2015 1433 DNR [id number]  Dustin Flock, MD Inpatient   02/15/2015 1635 02/15/2015 1651 Full Code [id number]   Dustin Flock, MD Inpatient   01/11/2015 0240 01/12/2015 1811 Full Code [id number]  Etta Quill, DO ED    Advance Directive Documentation  Most Recent Value  Type of Advance Directive  Healthcare Power of Attorney, Living will  Pre-existing out of facility DNR order (yellow form or pink MOST form)  -  "MOST" Form in Place?  -      TOTAL TIME TAKING CARE OF THIS PATIENT: 45 minutes.   Ethlyn Daniels 11/26/2018, 8:57 PM  [facility]  Office  [phone]  CC: Primary care physician; [name], NP  Note:  This document was prepared using Dragon voice recognition software and may include unintentional dictation errors.

## 2018-11-26 NOTE — Patient Instructions (Signed)
Stop by the lab and xray prior to leaving today. I will notify you of your results once received.   I recommend you go to the hospital due to the change in your urine color and your current symptoms. You could have a massive kidney/bladder infection.  Start Bactrim DS (sulfamethoxazole/trimethoprim) tablets for urinary tract infection. Take 1 tablet by mouth twice daily for 5 days.  Ensure you are consuming 64 ounces of water daily.  Increase your Lantus to 25 units. You MUST eat during the day if you are taking insulin. Continue to check your blood sugars.   PLEASE go to the hospital if your symptoms continue and/or become worse.  Call Dr. Diamantina Providence the Urologist to have your kidney stent removed.  It was a pleasure to see you today!

## 2018-11-26 NOTE — ED Notes (Signed)
Gracie RN, aware of bed assigned

## 2018-11-26 NOTE — ED Provider Notes (Signed)
[facility] Emergency Department Provider Note   ____________________________________________   I have reviewed the triage vital signs and the nursing notes.   HISTORY  Chief Complaint Recurrent UTI and Cough   History limited by: Not Limited   HPI [name] is a 63 y.o. female who presents to the emergency department today from doctors office because of providers concern for urinary tract infection and possible sepsis per patient. The patient states that for the past 10 days she has had issues with burning with urination and change of appearance of urine. She states that this has been accompanied by nausea and decreased oral intake. The patient states that she has also had some cough recently. She has a history of back pain and is followed by pain medication.  Records reviewed. Per medical record review patient has a history of chronic tachycardia, having had tachycardia on recent office visits.   Past Medical History:  Diagnosis Date  . Acute encephalopathy 05/22/2018  . Allergy   . Anxiety   . Ascites   . C. difficile colitis 07/10/2015  . Cancer (HCC)    HX OF CANCER OF UTERUS   . Cirrhosis of liver not due to alcohol ([place]) 2016  . Degenerative disk disease   . Diverticulitis   . Gastroparesis   . GERD (gastroesophageal reflux disease)   . History of hiatal hernia   . Hypertension   . Hypothyroid   . Hypothyroidism due to amiodarone   . Ileus ([place]) 08/01/2015  . Intussusception intestine ([place]) 05/2015  . Orthostatic hypotension   . PAF (paroxysmal atrial fibrillation) ([place]) 03/2015   a. new onset 03/2015 in setting of intractable N/V; b. on Eliquis 5 mg bid; c. CHADSVASc 4 (DM, TIA x 2, female)  . Pancreatitis   . Pneumonia 11/14/2015  . Right ureteral stone 07/14/2016  . Sick sinus syndrome ([place])   . Stomach ulcer   . Stroke [facility])    with minimal left sided weakness  . Syncope 01/2015  . Syncope due to orthostatic hypotension 05/18/2015   . Tachyarrhythmia 01/10/2016  . TIA (transient ischemic attack) 02/2015  . Type 1 diabetes ([place])    on levemir  . UTERINE CANCER, HX OF 03/27/2007   Qualifier: Diagnosis of  By: Maxie Better FNP, Rosalita Levan   . UTI (urinary tract infection) 05/22/2018    Patient Active Problem List   Diagnosis Date Noted  . Vitamin D deficiency 11/26/2018  . Elevated C-reactive protein (CRP) 11/26/2018  . Elevated sed rate 11/26/2018  . Chronic bilateral low back pain with bilateral sciatica (Primary Area of Pain) (L>R) 11/25/2018  . Chronic pain of both lower extremities (Secondary Area of Pain) (L>R) 11/25/2018  . Chronic neck pain 11/25/2018  . Pharmacologic therapy 11/25/2018  . Disorder of skeletal system 11/25/2018  . Problems influencing health status 11/25/2018  . Renal mass 10/06/2018  . Abdominal pain 09/25/2018  . Recurrent falls 09/12/2018  . Orthostatic hypotension 08/18/2018  . Normochromic normocytic anemia 08/18/2018  . ARF (acute renal failure) ([place]) 08/18/2018  . Chronic neck and back pain 08/14/2018  . History of hypothyroidism 08/14/2018  . Medical non-compliance 08/14/2018  . Diabetic ketoacidosis ([place]) 05/22/2018  . Sepsis ([place]) 12/15/2017  . Personal history of surgery to heart and great vessels, presenting hazards to health   . Congestive dilated cardiomyopathy ([place])   . NSTEMI (non-ST elevated myocardial infarction) ([place]) 01/11/2016  . Tachy-brady syndrome ([place]) 12/28/2015  . PAF (paroxysmal atrial fibrillation) ([place]) 11/24/2015  .  Type 2 diabetes mellitus with neurologic complication ([place]) 52/84/1324  . S/P cholecystectomy 11/23/2015  . Narcotic abuse ([place]) 11/22/2015  . Chronic superficial gastritis without bleeding 11/22/2015  . History of Clostridium difficile 11/22/2015  . History of TIA (transient ischemic attack) 11/22/2015  . Mixed hyperlipidemia 11/22/2015  . Diverticulosis 11/22/2015  . History of uterine cancer 11/22/2015  . Hypothyroidism, unspecified  11/22/2015  . Intractable cyclical vomiting with nausea 11/22/2015  . Narcotic withdrawal ([place]) 11/11/2015  . H/O TIA (transient ischemic attack) and stroke   . Encounter to establish care 08/31/2015  . Atrial fibrillation ([place]) 08/31/2015  . Insulin dependent type 2 diabetes mellitus ([place]) 08/31/2015  . Hyperlipidemia 08/31/2015  . Cryptogenic cirrhosis ([place]) 07/10/2015  . Paroxysmal atrial fibrillation ([place]) 07/10/2015  . Malnutrition of moderate degree 07/04/2015  . Symptomatic bradycardia 05/27/2015  . Chronic anemia 05/19/2015  . Thrombocytopenia ([place]) 05/19/2015  . Prolonged QT interval   . Hypomagnesemia   . Uncontrolled type 2 diabetes mellitus ([place])   . Hypokalemia 04/06/2015  . Hyperlipidemia with target LDL less than 100 04/06/2015  . CVA (cerebral vascular accident) ([place]) 02/15/2015  . Essential hypertension 01/12/2015  . Chronically on opiate therapy 01/12/2015  . S/P Nissen fundoplication (without gastrostomy tube) procedure 05/11/2014  . Dysphagia 04/19/2014  . Gastro-esophageal reflux disease without esophagitis 04/19/2014  . DEPRESSION/ANXIETY 06/27/2007  . MYOFASCIAL PAIN SYNDROME 06/27/2007  . Chronic pain syndrome 03/28/2007  . GERD 03/27/2007  . DIVERTICULOSIS, COLON 03/27/2007  . LUMBAR DISC DISPLACEMENT 03/27/2007  . PROTEINURIA 03/27/2007    Past Surgical History:  Procedure Laterality Date  . ABDOMINAL HYSTERECTOMY    . CARDIAC CATHETERIZATION N/A 01/12/2016   Procedure: Left Heart Cath and Coronary Angiography;  Surgeon: Wellington Hampshire, MD;  Location: [place] [facility];  Service: Cardiovascular;  Laterality: N/A;  . CHOLECYSTECTOMY    . CYSTOSCOPY/URETEROSCOPY/HOLMIUM LASER Right 07/14/2016   Procedure: CYSTOSCOPY/URETEROSCOPY/HOLMIUM LASER;  Surgeon: Alexis Frock, MD;  Location: [facility] ORS;  Service: Urology;  Laterality: Right;  . ESOPHAGOGASTRODUODENOSCOPY N/A 04/04/2015   Procedure: ESOPHAGOGASTRODUODENOSCOPY (EGD);  Surgeon: Hulen Luster, MD;   Location[facility] ENDOSCOPY;  Service: Endoscopy;  Laterality: N/A;  . ESOPHAGOGASTRODUODENOSCOPY N/A 12/28/2017   Procedure: ESOPHAGOGASTRODUODENOSCOPY (EGD);  Surgeon: Lin Landsman, MD;  Location: [facility] ENDOSCOPY;  Service: Gastroenterology;  Laterality: N/A;  . ESOPHAGOGASTRODUODENOSCOPY (EGD) WITH PROPOFOL N/A 01/18/2016   Procedure: ESOPHAGOGASTRODUODENOSCOPY (EGD) WITH PROPOFOL;  Surgeon: Lucilla Lame, MD;  Location: [facility] ENDOSCOPY;  Service: Endoscopy;  Laterality: N/A;  . FLEXIBLE SIGMOIDOSCOPY N/A 01/18/2016   Procedure: FLEXIBLE SIGMOIDOSCOPY;  Surgeon: Lucilla Lame, MD;  Location: [facility] ENDOSCOPY;  Service: Endoscopy;  Laterality: N/A;  . HERNIA REPAIR      Prior to Admission medications   Medication Sig Start Date End Date Taking? Authorizing Provider  acetaminophen (TYLENOL) 325 MG tablet Take 650 mg by mouth every 6 (six) hours as needed.    [provider]  apixaban (ELIQUIS) 5 MG TABS tablet Take 5 mg by mouth 2 (two) times daily.    [provider]  atorvastatin (LIPITOR) 40 MG tablet Take 40 mg by mouth daily.    [provider]  Blood Glucose Monitoring Suppl (CONTOUR BLOOD GLUCOSE SYSTEM) w/Device KIT Use to test blood sugar 3 times daily. Dx is E11.65 08/28/18   [name], NP  glucose blood test strip Contour Test Strips. Use as instructed to test blood sugar 3 times daily. 08/28/18   [name], NP  ibuprofen (ADVIL,MOTRIN) 200 MG tablet Take 200  mg by mouth every 6 (six) hours as needed.    [provider]  Insulin Glargine (LANTUS SOLOSTAR) 100 UNIT/ML Solostar Pen Inject 25 Units into the skin at bedtime. 11/26/18   [name], NP  Lancet Device MISC Use as instructed to test blood sugar 3 times daily. Dx is E11.65 08/28/18   [name], NP  metoprolol tartrate (LOPRESSOR) 25 MG tablet Take 0.5 tablets (12.5 mg total) by mouth 2 (two) times daily. For heart rate. 11/17/18 02/15/19  [name], NP     Allergies Hydrocodone; Aspirin; Erythromycin; Prednisone; Rosiglitazone maleate; Codeine sulfate; and Tetanus-diphtheria toxoids td  Family History  Problem Relation Age of Onset  . Hypertension Mother   . CAD Sister   . Heart attack Sister        Deceased 24-Nov-2014  . CAD Brother     Social History Social History   Tobacco Use  . Smoking status: Former Smoker    Types: Cigarettes  . Smokeless tobacco: Never Used  . Tobacco comment: 25 years ago and only smoked occasionally  Substance Use Topics  . Alcohol use: No  . Drug use: No    Review of Systems Constitutional: No fever/chills Eyes: No visual changes. ENT: No sore throat. Cardiovascular: Denies chest pain. Respiratory: Positive for cough. Gastrointestinal: No abdominal pain.  No nausea, no vomiting.  No diarrhea.   Genitourinary: Positive for dysuria.  Musculoskeletal: Chronic back pain. Skin: Negative for rash. Neurological: Negative for headaches, focal weakness or numbness.  ____________________________________________   PHYSICAL EXAM:  VITAL SIGNS: ED Triage Vitals  Enc Vitals Group     BP 11/26/18 1527 90/62     Pulse Rate 11/26/18 1527 (!) 125     Resp 11/26/18 1527 18     Temp 11/26/18 1527 98.4 F (36.9 C)     Temp Source 11/26/18 1527 Oral     SpO2 11/26/18 1527 99 %     Weight 11/26/18 1528 119 lb (54 kg)     Height 11/26/18 1528 5' 3"  (1.6 m)     Head Circumference --      Peak Flow --      Pain Score 11/26/18 1528 9   Constitutional: Alert and oriented.  Eyes: Conjunctivae are normal.  ENT      Head: Normocephalic and atraumatic.      Nose: No congestion/rhinnorhea.      Mouth/Throat: Mucous membranes are moist.      Neck: No stridor. Hematological/Lymphatic/Immunilogical: No cervical lymphadenopathy. Cardiovascular: Normal rate, regular rhythm.  No murmurs, rubs, or gallops.  Respiratory: Normal respiratory effort without tachypnea nor retractions. Breath sounds are clear and  equal bilaterally. No wheezes/rales/rhonchi. Gastrointestinal: Soft and non tender. No rebound. No guarding.  Genitourinary: Deferred Musculoskeletal: Normal range of motion in all extremities. No lower extremity edema. Neurologic:  Normal speech and language. No gross focal neurologic deficits are appreciated.  Skin:  Skin is warm, dry and intact. No rash noted. Psychiatric: Mood and affect are normal. Speech and behavior are normal. Patient exhibits appropriate insight and judgment.  ____________________________________________    LABS (pertinent positives/negatives)  UA turbid, large hgb dipstick, moderate leukocytes, > 50 wbc and rbc Lactic 2.0->1.6 Trop <0.03 BMP na 130, k 3.1, glu 317, cr 1.14 CBC wbc 8.5, hgb 10.4, plt 245 ____________________________________________   EKG  I, Nance Pear, attending physician, personally viewed and interpreted this EKG  EKG Time: 1547 Rate: 122 Rhythm: sinus tachycardia Axis: normal Intervals: qtc 473 QRS: narrow, q  waves v1, III ST changes: no st elevation Impression: abnormal ekg   ____________________________________________    RADIOLOGY  CXR Bilateral linear opacities favoring atelectasis  ____________________________________________   PROCEDURES  Procedures  ____________________________________________   INITIAL IMPRESSION / ASSESSMENT AND PLAN / ED COURSE  Pertinent labs & imaging results that were available during my care of the patient were reviewed by me and considered in my medical decision making (see chart for details).   Patient presented to the emergency department today from [place] office because of concerns for urinary tract infection.  Blood work does show an elevated lactic and urine is consistent with a urinary tract infection.  At this point I do have some concerns for more significant infection.  Do think patient would benefit from IV antibiotics.  Discussed this with patient.  She feels  comfortable be admitted to the hospital.   ____________________________________________   FINAL CLINICAL IMPRESSION(S) / ED DIAGNOSES  Final diagnoses:  Lower urinary tract infection     Note: This dictation was prepared with Dragon dictation. Any transcriptional errors that result from this process are unintentional     Nance Pear, MD 11/26/18 2157

## 2018-11-26 NOTE — Progress Notes (Signed)
Good morning  [name] was seen on yesterday. She was dehydrated and here are her labs. I see that she has a history of hypokalemia etc. We normally refer the patient to the PCP.

## 2018-11-26 NOTE — ED Notes (Signed)
ED TO INPATIENT HANDOFF REPORT  ED Nurse Name and Phone #: Terri Piedra [phone]  S Name/Age/Gender [name] 63 y.o. female Room/Bed: ED10A/ED10A  Code Status   Code Status: Prior  Home/SNF/Other Home Patient oriented to: self, place, time and situation Is this baseline? Yes   Triage Complete: Triage complete  Chief Complaint Poss uti/cough  Triage Note First RN: Pt presents to ED from [facility], per [place] care pt has tan and cloudy urine and cough, RN states concerns for sepsis at this time. Pt presents to ED wearing mask at this time.   Pt to ED from home sent over by PCP concerns of UTI, cough and possible pneumonia.  Pt states cough x1 week, non productive, SOB.  Burning with urination, white discharge x1 week.  States has not had an appetite and unable to eat last 10 days with 10lb weight loss.  Pt with cough but lungs sound clear, skin WNL.   Allergies Allergies  Allergen Reactions  . Hydrocodone Other (See Comments)    Pt states that this medication caused cirrhosis of the liver.    . Aspirin   . Erythromycin Other (See Comments)    Reaction:  Fever   . Prednisone Other (See Comments)    Reaction:  Unknown   . Rosiglitazone Maleate Swelling  . Codeine Sulfate Rash  . Tetanus-Diphtheria Toxoids Td Rash and Other (See Comments)    Reaction:  Fever     Level of Care/Admitting Diagnosis ED Disposition    ED Disposition Condition [facility] Area: [place] [[id number]]  Level of Care: Med-Surg [16]  Diagnosis: Sepsis [facility]) [[id number]]  Admitting Physician: Lance Coon [[id number]]  Attending Physician: Lance Coon [phone]  Estimated length of stay: past midnight tomorrow  Certification:: I certify this patient will need inpatient services for at least 2 midnights  PT Class (Do Not Modify): Inpatient [101]  PT Acc Code (Do Not Modify): Private [1]       B Medical/Surgery History Past Medical History:   Diagnosis Date  . Acute encephalopathy 05/22/2018  . Allergy   . Anxiety   . Ascites   . C. difficile colitis 07/10/2015  . Cancer (HCC)    HX OF CANCER OF UTERUS   . Cirrhosis of liver not due to alcohol ([employer]) 2016  . Degenerative disk disease   . Diverticulitis   . Gastroparesis   . GERD (gastroesophageal reflux disease)   . History of hiatal hernia   . Hypertension   . Hypothyroid   . Hypothyroidism due to amiodarone   . Ileus ([place]) 08/01/2015  . Intussusception intestine ([place]) 05/2015  . Orthostatic hypotension   . PAF (paroxysmal atrial fibrillation) ([place]) 03/2015   a. new onset 03/2015 in setting of intractable N/V; b. on Eliquis 5 mg bid; c. CHADSVASc 4 (DM, TIA x 2, female)  . Pancreatitis   . Pneumonia 11/14/2015  . Right ureteral stone 07/14/2016  . Sick sinus syndrome ([place])   . Stomach ulcer   . Stroke [facility])    with minimal left sided weakness  . Syncope 01/2015  . Syncope due to orthostatic hypotension 05/18/2015  . Tachyarrhythmia 01/10/2016  . TIA (transient ischemic attack) 02/2015  . Type 1 diabetes ([place])    on levemir  . UTERINE CANCER, HX OF 03/27/2007   Qualifier: Diagnosis of  By: Maxie Better FNP, Rosalita Levan   . UTI (urinary tract infection) 05/22/2018   Past Surgical History:  Procedure Laterality  Date  . ABDOMINAL HYSTERECTOMY    . CARDIAC CATHETERIZATION N/A 01/12/2016   Procedure: Left Heart Cath and Coronary Angiography;  Surgeon: Wellington Hampshire, MD;  Location: [place] CV LAB;  Service: Cardiovascular;  Laterality: N/A;  . CHOLECYSTECTOMY    . CYSTOSCOPY/URETEROSCOPY/HOLMIUM LASER Right 07/14/2016   Procedure: CYSTOSCOPY/URETEROSCOPY/HOLMIUM LASER;  Surgeon: Alexis Frock, MD;  Location: [facility] ORS;  Service: Urology;  Laterality: Right;  . ESOPHAGOGASTRODUODENOSCOPY N/A 04/04/2015   Procedure: ESOPHAGOGASTRODUODENOSCOPY (EGD);  Surgeon: Hulen Luster, MD;  Location: [facility] ENDOSCOPY;  Service: Endoscopy;  Laterality: N/A;  . ESOPHAGOGASTRODUODENOSCOPY  N/A 12/28/2017   Procedure: ESOPHAGOGASTRODUODENOSCOPY (EGD);  Surgeon: Lin Landsman, MD;  Location: [facility] ENDOSCOPY;  Service: Gastroenterology;  Laterality: N/A;  . ESOPHAGOGASTRODUODENOSCOPY (EGD) WITH PROPOFOL N/A 01/18/2016   Procedure: ESOPHAGOGASTRODUODENOSCOPY (EGD) WITH PROPOFOL;  Surgeon: Lucilla Lame, MD;  Location: [facility] ENDOSCOPY;  Service: Endoscopy;  Laterality: N/A;  . FLEXIBLE SIGMOIDOSCOPY N/A 01/18/2016   Procedure: FLEXIBLE SIGMOIDOSCOPY;  Surgeon: Lucilla Lame, MD;  Location: [facility] ENDOSCOPY;  Service: Endoscopy;  Laterality: N/A;  . HERNIA REPAIR       A IV Location/Drains/Wounds Patient Lines/Drains/Airways Status   Active Line/Drains/Airways    Name:   Placement date:   Placement time:   Site:   Days:   Peripheral IV 11/26/18 Right Hand   11/26/18    1556    Hand   less than 1          Intake/Output Last 24 hours  Intake/Output Summary (Last 24 hours) at 11/26/2018 2122 Last data filed at 11/26/2018 2105 Gross per 24 hour  Intake 100 ml  Output -  Net 100 ml    Labs/Imaging Results for orders placed or performed during the hospital encounter of 11/26/18 (from the past 48 hour(s))  Lactic acid, plasma     Status: Abnormal   Collection Time: 11/26/18  3:52 PM  Result Value Ref Range   Lactic Acid, Venous 2.0 (HH) 0.5 - 1.9 mmol/L    Comment: CRITICAL RESULT CALLED TO, READ BACK BY AND VERIFIED WITH GRACIE LINEMAN 11/26/18 1638 KLW Performed at [facility], [address] [place], [place] [address]   Basic metabolic panel     Status: Abnormal   Collection Time: 11/26/18  3:55 PM  Result Value Ref Range   Sodium 130 (L) 135 - 145 mmol/L   Potassium 3.1 (L) 3.5 - 5.1 mmol/L   Chloride 98 98 - 111 mmol/L   CO2 20 (L) 22 - 32 mmol/L   Glucose, Bld 317 (H) 70 - 99 mg/dL   BUN 18 8 - 23 mg/dL   Creatinine, Ser 1.14 (H) 0.44 - 1.00 mg/dL   Calcium 8.4 (L) 8.9 - 10.3 mg/dL   GFR calc non Af Amer 51 (L) >60 mL/min   GFR calc Af Amer 59 (L) >60  mL/min   Anion gap 12 5 - 15    Comment: Performed at [facility], [address] [place], [place] [address]  CBC     Status: Abnormal   Collection Time: 11/26/18  3:55 PM  Result Value Ref Range   WBC 8.5 4.0 - 10.5 K/uL   RBC 4.00 3.87 - 5.11 MIL/uL   Hemoglobin 10.4 (L) 12.0 - 15.0 g/dL   HCT 33.4 (L) 36.0 - 46.0 %   MCV 83.5 80.0 - 100.0 fL   MCH 26.0 26.0 - 34.0 pg   MCHC 31.1 30.0 - 36.0 g/dL   RDW 14.0 11.5 - 15.5 %  Platelets 245 150 - 400 K/uL   nRBC 0.0 0.0 - 0.2 %    Comment: Performed at [facility], [address] [place], [place] [address]  Troponin I - ONCE - STAT     Status: None   Collection Time: 11/26/18  3:55 PM  Result Value Ref Range   Troponin I <0.03 <0.03 ng/mL    Comment: Performed at [facility], [address] [place], [place] [address]  Protime-INR (order if Patient is taking Coumadin / Warfarin)     Status: Abnormal   Collection Time: 11/26/18  3:55 PM  Result Value Ref Range   Prothrombin Time 16.2 (H) 11.4 - 15.2 seconds   INR 1.3 (H) 0.8 - 1.2    Comment: (NOTE) INR goal varies based on device and disease states. Performed at [facility], [address] [place], [place] [address]   Urinalysis, Complete w Microscopic     Status: Abnormal   Collection Time: 11/26/18  3:55 PM  Result Value Ref Range   Color, Urine YELLOW (A) YELLOW   APPearance TURBID (A) CLEAR   Specific Gravity, Urine 1.021 1.005 - 1.030   pH 5.0 5.0 - 8.0   Glucose, UA >=500 (A) NEGATIVE mg/dL   Hgb urine dipstick LARGE (A) NEGATIVE   Bilirubin Urine NEGATIVE NEGATIVE   Ketones, ur 5 (A) NEGATIVE mg/dL   Protein, ur 100 (A) NEGATIVE mg/dL   Nitrite NEGATIVE NEGATIVE   Leukocytes,Ua MODERATE (A) NEGATIVE   RBC / HPF >50 (H) 0 - 5 RBC/hpf   WBC, UA >50 (H) 0 - 5 WBC/hpf   Bacteria, UA NONE SEEN NONE SEEN   Squamous Epithelial / LPF 11-20 0 - 5   WBC Clumps PRESENT    Budding Yeast PRESENT     Comment: Performed at  [facility], [address] [place], [place] [address]  Lactic acid, plasma     Status: None   Collection Time: 11/26/18  6:03 PM  Result Value Ref Range   Lactic Acid, Venous 1.6 0.5 - 1.9 mmol/L    Comment: Performed at [facility], [address] [place],  [address]   Dg Chest 2 View  Result Date: 11/26/2018 CLINICAL DATA:  Cough. EXAM: CHEST - 2 VIEW COMPARISON:  Chest x-ray dated August 18, 2018. FINDINGS: The heart size and mediastinal contours are within normal limits. Normal pulmonary vascularity. Atherosclerotic calcification of the aortic arch. Streaky linear opacities in both lower lobes. No focal consolidation, pleural effusion, or pneumothorax. No acute osseous abnormality. IMPRESSION: 1. Bilateral lower lobe streaky linear opacities, favoring atelectasis. Electronically Signed   By: Titus Dubin M.D.   On: 11/26/2018 16:53    Pending Labs Unresulted Labs (From admission, onward)    Start     Ordered   11/26/18 2009  Urine Culture  Add-on,   AD     11/26/18 2008   Signed and Held  Basic metabolic panel  Tomorrow morning,   R     Signed and Held   Signed and Held  CBC  Tomorrow morning,   R     Signed and Held          Vitals/Pain Today's Vitals   11/26/18 1528 11/26/18 1603 11/26/18 1604 11/26/18 2033  BP:   98/84   Pulse:   (!) 118   Resp:   (!) 23   Temp:      TempSrc:      SpO2:   98%  Weight: 54 kg     Height: 5' 3"  (1.6 m)     PainSc: 9  10-Worst pain ever  8     Isolation Precautions No active isolations  Medications Medications  sodium chloride flush (NS) 0.9 % injection 3 mL (3 mLs Intravenous Not Given 11/26/18 1614)  sodium chloride 0.9 % bolus 1,000 mL (0 mLs Intravenous Stopped 11/26/18 1605)  fentaNYL (SUBLIMAZE) injection 50 mcg (50 mcg Intravenous Given 11/26/18 1953)  cefTRIAXone (ROCEPHIN) 1 g in sodium chloride 0.9 % 100 mL IVPB (0 g Intravenous Stopped 11/26/18 2105)    Mobility walks Moderate fall  risk   Focused Assessments GU   R Recommendations: See Admitting Provider Note  Report given to:   Additional Notes:

## 2018-11-26 NOTE — Progress Notes (Signed)
The Patient is admitted to 1 A 37 with the diagnosis of sepsis. A & O x 4. No acute respiratory distress noted. Patient oriented to her room, call bell/ascom. Full assessment to epic completed. Will continue to monitor.

## 2018-11-26 NOTE — ED Notes (Signed)
Attempted to call report at this time

## 2018-11-27 LAB — URINE CULTURE: Culture: 50000 — AB

## 2018-11-27 LAB — BASIC METABOLIC PANEL
Anion gap: 8 (ref 5–15)
BUN: 18 mg/dL (ref 8–23)
CO2: 20 mmol/L — AB (ref 22–32)
Calcium: 7.6 mg/dL — ABNORMAL LOW (ref 8.9–10.3)
Chloride: 104 mmol/L (ref 98–111)
Creatinine, Ser: 0.99 mg/dL (ref 0.44–1.00)
GFR calc Af Amer: >60 mL/min (ref >60)
GFR calc non Af Amer: >60 mL/min (ref >60)
Glucose, Bld: 210 mg/dL — ABNORMAL HIGH (ref 70–99)
Potassium: 3.2 mmol/L — ABNORMAL LOW (ref 3.5–5.1)
Sodium: 132 mmol/L — ABNORMAL LOW (ref 135–145)

## 2018-11-27 LAB — CBC
HCT: 30.8 % — ABNORMAL LOW (ref 36.0–46.0)
Hemoglobin: 9.6 g/dL — ABNORMAL LOW (ref 12.0–15.0)
MCH: 26.4 pg (ref 26.0–34.0)
MCHC: 31.2 g/dL (ref 30.0–36.0)
MCV: 84.8 fL (ref 80.0–100.0)
NRBC: 0 % (ref 0.0–0.2)
Platelets: 211 10*3/uL (ref 150–400)
RBC: 3.63 MIL/uL — ABNORMAL LOW (ref 3.87–5.11)
RDW: 14 % (ref 11.5–15.5)
WBC: 7.9 10*3/uL (ref 4.0–10.5)

## 2018-11-27 LAB — GLUCOSE, CAPILLARY
Glucose-Capillary: 173 mg/dL — ABNORMAL HIGH (ref 70–99)
Glucose-Capillary: 187 mg/dL — ABNORMAL HIGH (ref 70–99)

## 2018-11-27 MED ORDER — ALBUTEROL SULFATE HFA 108 (90 BASE) MCG/ACT IN AERS: 2.0000 | INHALATION_SPRAY | Freq: Four times a day (QID) | RESPIRATORY_TRACT | 2 refills | Status: DC | PRN

## 2018-11-27 MED ORDER — AMOXICILLIN-POT CLAVULANATE 875-125 MG PO TABS: 1.0000 | ORAL_TABLET | Freq: Two times a day (BID) | ORAL | 0 refills | Status: AC

## 2018-11-27 NOTE — TOC Initial Note (Signed)
Transition of Care (TOC) - Initial/Assessment Note  Met with patient to assess for needs, Patient states that she has RW and several canes at home and is independent, she lives with her husband and has a case worker for [employer] that comes and arranges assistance when needed.  The case worker takes her to her appointments as needed and to the grocery store, She stated that she needed food bank information, gave her the name and address of 3 food banks that are showing to be open on google and let her know her case worker may also have some additional information.  She states that she has no other needs at this time  Patient Details  Name: [name] MRN: [id number] Date of Birth: 24-Jan-1956  Transition of Care [facility]) CM/SW Contact:    Su Hilt, RN Phone Number: 11/27/2018, 12:08 PM  Clinical Narrative:                   Expected Discharge Plan: Home/Self Care Barriers to Discharge: Barriers Resolved   Patient Goals and CMS Choice Patient states their goals for this hospitalization and ongoing recovery are:: go home      Expected Discharge Plan and Services Expected Discharge Plan: Home/Self Care In-house Referral: NA Discharge Planning Services: CM Consult   Living arrangements for the past 2 months: Single Family Home Expected Discharge Date: 11/27/18                        Prior Living Arrangements/Services Living arrangements for the past 2 months: Single Family Home Lives with:: Spouse Patient language and need for interpreter reviewed:: Yes Do you feel safe going back to the place where you live?: Yes      Need for Family Participation in Patient Care: No (Comment) Care giver support system in place?: Yes (comment) Current home services: DME(has canes and RW at home) Criminal Activity/Legal Involvement Pertinent to Current Situation/Hospitalization: No - Comment as needed  Activities of Daily Living Home Assistive Devices/Equipment: Cane (specify quad  or straight), CBG Meter, Eyeglasses, Walker (specify type) ADL Screening (condition at time of admission) Patient's cognitive ability adequate to safely complete daily activities?: Yes Is the patient deaf or have difficulty hearing?: No Does the patient have difficulty seeing, even when wearing glasses/contacts?: No Does the patient have difficulty concentrating, remembering, or making decisions?: No Patient able to express need for assistance with ADLs?: Yes Does the patient have difficulty dressing or bathing?: No Independently performs ADLs?: Yes (appropriate for developmental age) Does the patient have difficulty walking or climbing stairs?: No Weakness of Legs: None Weakness of Arms/Hands: None  Permission Sought/Granted                  Emotional Assessment Appearance:: Appears stated age Attitude/Demeanor/Rapport: Engaged Affect (typically observed): Accepting Orientation: : Oriented to Self, Oriented to Place, Oriented to  Time, Oriented to Situation Alcohol / Substance Use: Never Used Psych Involvement: No (comment)  Admission diagnosis:  Lower urinary tract infection [N39.0] Patient Active Problem List   Diagnosis Date Noted  . Vitamin D deficiency 11/26/2018  . Elevated C-reactive protein (CRP) 11/26/2018  . Elevated sed rate 11/26/2018  . Chronic bilateral low back pain with bilateral sciatica (Primary Area of Pain) (L>R) 11/25/2018  . Chronic pain of both lower extremities (Secondary Area of Pain) (L>R) 11/25/2018  . Chronic neck pain 11/25/2018  . Pharmacologic therapy 11/25/2018  . Disorder of skeletal system 11/25/2018  . Problems influencing  health status 11/25/2018  . Renal mass 10/06/2018  . Abdominal pain 09/25/2018  . Recurrent falls 09/12/2018  . Orthostatic hypotension 08/18/2018  . Normochromic normocytic anemia 08/18/2018  . CKD (chronic kidney disease), stage III ([place]) 08/18/2018  . Chronic neck and back pain 08/14/2018  . History of  hypothyroidism 08/14/2018  . Medical non-compliance 08/14/2018  . Diabetic ketoacidosis ([place]) 05/22/2018  . UTI (urinary tract infection) 05/22/2018  . Sepsis ([place]) 12/15/2017  . Personal history of surgery to heart and great vessels, presenting hazards to health   . Congestive dilated cardiomyopathy ([place])   . NSTEMI (non-ST elevated myocardial infarction) ([place]) 01/11/2016  . Tachy-brady syndrome ([place]) 12/28/2015  . PAF (paroxysmal atrial fibrillation) ([place]) 11/24/2015  . Type 2 diabetes mellitus with neurologic complication ([place]) 52/77/8242  . S/P cholecystectomy 11/23/2015  . Narcotic abuse ([place]) 11/22/2015  . Chronic superficial gastritis without bleeding 11/22/2015  . History of Clostridium difficile 11/22/2015  . History of TIA (transient ischemic attack) 11/22/2015  . Mixed hyperlipidemia 11/22/2015  . Diverticulosis 11/22/2015  . History of uterine cancer 11/22/2015  . Hypothyroidism, unspecified 11/22/2015  . Intractable cyclical vomiting with nausea 11/22/2015  . Narcotic withdrawal ([place]) 11/11/2015  . H/O TIA (transient ischemic attack) and stroke   . Encounter to establish care 08/31/2015  . Insulin dependent type 2 diabetes mellitus ([place]) 08/31/2015  . Hyperlipidemia 08/31/2015  . Cryptogenic cirrhosis ([place]) 07/10/2015  . Paroxysmal atrial fibrillation ([place]) 07/10/2015  . Malnutrition of moderate degree 07/04/2015  . Symptomatic bradycardia 05/27/2015  . Chronic anemia 05/19/2015  . Thrombocytopenia ([place]) 05/19/2015  . Prolonged QT interval   . Hypomagnesemia   . Uncontrolled type 2 diabetes mellitus ([place])   . Hypokalemia 04/06/2015  . Hyperlipidemia with target LDL less than 100 04/06/2015  . CVA (cerebral vascular accident) ([place]) 02/15/2015  . Essential hypertension 01/12/2015  . Chronically on opiate therapy 01/12/2015  . S/P Nissen fundoplication (without gastrostomy tube) procedure 05/11/2014  . Dysphagia 04/19/2014  . DEPRESSION/ANXIETY 06/27/2007  .  MYOFASCIAL PAIN SYNDROME 06/27/2007  . Chronic pain syndrome 03/28/2007  . GERD 03/27/2007  . DIVERTICULOSIS, COLON 03/27/2007  . LUMBAR DISC DISPLACEMENT 03/27/2007  . PROTEINURIA 03/27/2007   PCP:  [name], NP Pharmacy:   [place], [address] [place] [address] Phone: [phone] Fax: [phone]  [facility] [place], [place] [address] [place] [address] Phone: [phone] Fax: [phone]     Social Determinants of Health (SDOH) Interventions    Readmission Risk Interventions No flowsheet data found.

## 2018-11-27 NOTE — Progress Notes (Signed)
Inpatient Diabetes Program Recommendations  AACE/ADA: New Consensus Statement on Inpatient Glycemic Control (2015)  Target Ranges:  Prepandial:   less than 140 mg/dL      Peak postprandial:   less than 180 mg/dL (1-2 hours)      Critically ill patients:  140 - 180 mg/dL   Lab Results  Component Value Date   GLUCAP 173 (H) 11/27/2018   HGBA1C 9.4 (A) 11/17/2018    Review of Glycemic Control Results for [name], [name] (MRN [id number]) as of 11/27/2018 09:24  Ref. Range 11/26/2018 22:27 11/27/2018 08:00  Glucose-Capillary Latest Ref Range: 70 - 99 mg/dL 199 (H) 173 (H)  Results for [name], [name] (MRN [id number]) as of 11/27/2018 09:24  Ref. Range 08/14/2018 10:20 11/17/2018 10:14 11/17/2018 10:14  Hemoglobin A1C Latest Ref Range: 4.0 - 5.6 % 14.6 (A) Pend 9.4 (A)   Diabetes history: DM 2 Outpatient Diabetes medications:  Lantus 25 units q HS Current orders for Inpatient glycemic control: Novolog sensitive tid with meals and HS Lantus 15 units q HS Inpatient Diabetes Program Recommendations:    Note A1C has improved from 14.6% to 9.4%. Blood sugars currently controlled.   Thanks  Adah Perl, RN, BC-ADM Inpatient Diabetes Coordinator Pager [phone] (8a-5p)

## 2018-11-27 NOTE — TOC Transition Note (Signed)
Transition of Care [facility]) - CM/SW Discharge Note   Patient Details  Name: PHARRAH ROTTMAN MRN: [id number] Date of Birth: Jan 27, 1956  Transition of Care [facility]) CM/SW Contact:  Su Hilt, RN Phone Number: 11/27/2018, 12:11 PM   Clinical Narrative:    Discharge home with husband Reviewed meds for high risk for readmit Has transportation with husband and case worker   Final next level of care: Home/Self Care Barriers to Discharge: Barriers Resolved   Patient Goals and CMS Choice Patient states their goals for this hospitalization and ongoing recovery are:: go home      Discharge Placement                       Discharge Plan and Services In-house Referral: NA Discharge Planning Services: CM Consult                      Social Determinants of Health (SDOH) Interventions     Readmission Risk Interventions No flowsheet data found.

## 2018-11-27 NOTE — Discharge Summary (Signed)
[name], is a 63 y.o. female  DOB 1956/08/30  MRN [id number].  Admission date:  11/26/2018  Admitting Physician  Lance Coon, MD  Discharge Date:  11/27/2018   Primary MD  [name], NP  Recommendations for primary care physician for things to follow:   Follow-up with PCP in 1 week   Admission Diagnosis  Lower urinary tract infection [N39.0]   Discharge Diagnosis  Lower urinary tract infection [N39.0]    Principal Problem:   Sepsis ([place]) Active Problems:   DEPRESSION/ANXIETY   Chronic pain syndrome   GERD   Essential hypertension   UTI (urinary tract infection)   CKD (chronic kidney disease), stage III (HCC)   PAF (paroxysmal atrial fibrillation) (HCC)   Type 2 diabetes mellitus with neurologic complication ([place])   Hyperlipidemia      Past Medical History:  Diagnosis Date  . Acute encephalopathy 05/22/2018  . Allergy   . Anxiety   . Ascites   . C. difficile colitis 07/10/2015  . Cancer (HCC)    HX OF CANCER OF UTERUS   . Cirrhosis of liver not due to alcohol ([place]) 2016  . Degenerative disk disease   . Diverticulitis   . Gastroparesis   . GERD (gastroesophageal reflux disease)   . History of hiatal hernia   . Hypertension   . Hypothyroid   . Hypothyroidism due to amiodarone   . Ileus ([place]) 08/01/2015  . Intussusception intestine ([place]) 05/2015  . Orthostatic hypotension   . PAF (paroxysmal atrial fibrillation) ([place]) 03/2015   a. new onset 03/2015 in setting of intractable N/V; b. on Eliquis 5 mg bid; c. CHADSVASc 4 (DM, TIA x 2, female)  . Pancreatitis   . Pneumonia 11/14/2015  . Right ureteral stone 07/14/2016  . Sick sinus syndrome ([place])   . Stomach ulcer   . Stroke [facility])    with minimal left sided weakness  . Syncope 01/2015  . Syncope due to orthostatic hypotension 05/18/2015  .  Tachyarrhythmia 01/10/2016  . TIA (transient ischemic attack) 02/2015  . Type 1 diabetes ([place])    on levemir  . UTERINE CANCER, HX OF 03/27/2007   Qualifier: Diagnosis of  By: Maxie Better FNP, Rosalita Levan   . UTI (urinary tract infection) 05/22/2018    Past Surgical History:  Procedure Laterality Date  . ABDOMINAL HYSTERECTOMY    . CARDIAC CATHETERIZATION N/A 01/12/2016   Procedure: Left Heart Cath and Coronary Angiography;  Surgeon: Wellington Hampshire, MD;  Location: [facility] CV LAB;  Service: Cardiovascular;  Laterality: N/A;  . CHOLECYSTECTOMY    . CYSTOSCOPY/URETEROSCOPY/HOLMIUM LASER Right 07/14/2016   Procedure: CYSTOSCOPY/URETEROSCOPY/HOLMIUM LASER;  Surgeon: Alexis Frock, MD;  Location: [facility] ORS;  Service: Urology;  Laterality: Right;  . ESOPHAGOGASTRODUODENOSCOPY N/A 04/04/2015   Procedure: ESOPHAGOGASTRODUODENOSCOPY (EGD);  Surgeon: Hulen Luster, MD;  Location: [facility] ENDOSCOPY;  Service: Endoscopy;  Laterality: N/A;  . ESOPHAGOGASTRODUODENOSCOPY N/A 12/28/2017   Procedure: ESOPHAGOGASTRODUODENOSCOPY (EGD);  Surgeon: Lin Landsman, MD;  Location: [facility] ENDOSCOPY;  Service: Gastroenterology;  Laterality: N/A;  . ESOPHAGOGASTRODUODENOSCOPY (EGD) WITH PROPOFOL N/A 01/18/2016   Procedure: ESOPHAGOGASTRODUODENOSCOPY (EGD) WITH PROPOFOL;  Surgeon: Lucilla Lame, MD;  Location: [facility] ENDOSCOPY;  Service: Endoscopy;  Laterality: N/A;  . FLEXIBLE SIGMOIDOSCOPY N/A 01/18/2016   Procedure: FLEXIBLE SIGMOIDOSCOPY;  Surgeon: Lucilla Lame, MD;  Location: [facility] ENDOSCOPY;  Service: Endoscopy;  Laterality: N/A;  . HERNIA REPAIR         History of present illness and  Hospital Course:  Kindly see H&P for history of present illness and admission details, please review complete Labs, Consult reports and Test reports for all details in brief  HPI  from the history and physical done on the day of admission 63 year old female with history of C. difficile colitis, gastroparesis,  anxiety,  hypertension, diabetes mellitus type 2, admitted for sepsis secondary to UTI.   Hospital Course  #1 .sepsis present on admission secondary to UTI, resolving, patient felt to be cleared nausea, unable to eat or drink for 5 to 6 days, patient UA consistent with UTI, started on Rocephin, IV hydration, urine cultures are pending , told the patient about the same, she says she is willing to take the risk and wants to go home, discharge instructions are in the computer.  Discharge home with Augmentin for 7 days.  Unable to discharge home with Cipro for possible QT prolongation due t interaction. 2.  Diabetes mellitus type 2: Controlled, continue Levemir 15 units twice daily Paroxysmal atrial fibrillation, continue Eliquis, Toprol 12.5 mg p.o. twice daily. Again patient wants to go home and willing to take the risk, told the patient with microbiology will call with urine culture data if patient has any persistent urine infection. Acute bronchitis with cough, continue Augmentin, albuterol inhaler is added to her cough. Discharge Condition: Stable   Follow UP  Follow-up Information    [name], NP. Schedule an appointment as soon as possible for a visit in 1 week(s).   Specialty:  Internal Medicine Contact information: [address] [address] [phone]             Discharge Instructions  and  Discharge Medications      Allergies as of 11/27/2018      Reactions   Hydrocodone Other (See Comments)   Pt states that this medication caused cirrhosis of the liver.     Aspirin    Erythromycin Other (See Comments)   Reaction:  Fever    Prednisone Other (See Comments)   Reaction:  Unknown    Rosiglitazone Maleate Swelling   Codeine Sulfate Rash   Tetanus-diphtheria Toxoids Td Rash, Other (See Comments)   Reaction:  Fever       Medication List    STOP taking these medications   ibuprofen 200 MG tablet Commonly known as:  ADVIL,MOTRIN     TAKE these  medications   acetaminophen 325 MG tablet Commonly known as:  TYLENOL Take 650 mg by mouth every 6 (six) hours as needed.   albuterol 108 (90 Base) MCG/ACT inhaler Commonly known as:  PROVENTIL HFA;VENTOLIN HFA Inhale 2 puffs into the lungs every 6 (six) hours as needed for wheezing or shortness of breath.   amoxicillin-clavulanate 875-125 MG tablet Commonly known as:  Augmentin Take 1 tablet by mouth 2 (two) times daily for 7 days.   apixaban 5 MG Tabs tablet Commonly known as:  ELIQUIS Take 5 mg by mouth 2 (two) times daily.   atorvastatin 40 MG tablet Commonly known as:  LIPITOR Take 40 mg by mouth daily.   Contour Blood Glucose System w/Device Kit Use to test blood sugar 3 times daily. Dx is E11.65   glucose blood test strip Contour Test Strips. Use as instructed to test blood sugar 3 times daily.   Insulin Glargine 100 UNIT/ML Solostar Pen Commonly known as:  Lantus SoloStar Inject 25 Units into the skin at bedtime.   Lancet Device Misc Use as instructed to test blood sugar 3 times daily.  Dx is E11.65   metoprolol tartrate 25 MG tablet Commonly known as:  LOPRESSOR Take 0.5 tablets (12.5 mg total) by mouth 2 (two) times daily. For heart rate.         Diet and Activity recommendation: See Discharge Instructions above   Consults obtained -none   Major procedures and Radiology Reports - PLEASE review detailed and final reports for all details, in brief -     Dg Chest 2 View  Result Date: 11/26/2018 CLINICAL DATA:  Cough. EXAM: CHEST - 2 VIEW COMPARISON:  Chest x-ray dated August 18, 2018. FINDINGS: The heart size and mediastinal contours are within normal limits. Normal pulmonary vascularity. Atherosclerotic calcification of the aortic arch. Streaky linear opacities in both lower lobes. No focal consolidation, pleural effusion, or pneumothorax. No acute osseous abnormality. IMPRESSION: 1. Bilateral lower lobe streaky linear opacities, favoring atelectasis.  Electronically Signed   By: Titus Dubin M.D.   On: 11/26/2018 16:53    Micro Results      No results found for this or any previous visit (from the past 240 hour(s)).     Today   Subjective:   [name] today has no headache,no chest abdominal pain,no new weakness tingling or numbness, feels much better wants to go home today.   Objective:   Blood pressure 116/70, pulse 98, temperature 98 F (36.7 C), temperature source Oral, resp. rate 18, height 5' 3"  (1.6 m), weight 54 kg, SpO2 97 %.   Intake/Output Summary (Last 24 hours) at 11/27/2018 1103 Last data filed at 11/27/2018 0900 Gross per 24 hour  Intake 340 ml  Output -  Net 340 ml    Exam Awake Alert, Oriented x 3, No new F.N deficits, Normal affect [place].AT,PERRAL Supple Neck,No JVD, No cervical lymphadenopathy appriciated.  Symmetrical Chest wall movement, Good air movement bilaterally, CTAB RRR,No Gallops,Rubs or new Murmurs, No Parasternal Heave +ve B.Sounds, Abd Soft, Non tender, No organomegaly appriciated, No rebound -guarding or rigidity. No Cyanosis, Clubbing or edema, No new Rash or bruise  Data Review   CBC w Diff:  Lab Results  Component Value Date   WBC 7.9 11/27/2018   HGB 9.6 (L) 11/27/2018   HGB 12.9 10/13/2014   HCT 30.8 (L) 11/27/2018   HCT 38.4 10/13/2014   PLT 211 11/27/2018   PLT 113 (L) 10/13/2014   LYMPHOPCT 11 09/25/2018   LYMPHOPCT 24.8 10/13/2014   BANDSPCT 0 10/26/2007   MONOPCT 6 09/25/2018   MONOPCT 9.6 10/13/2014   EOSPCT 1 09/25/2018   EOSPCT 2.0 10/13/2014   BASOPCT 0 09/25/2018   BASOPCT 0.4 10/13/2014    CMP:  Lab Results  Component Value Date   NA 132 (L) 11/27/2018   NA 138 10/14/2014   K 3.2 (L) 11/27/2018   K 4.1 10/14/2014   CL 104 11/27/2018   CL 107 10/14/2014   CO2 20 (L) 11/27/2018   CO2 26 10/14/2014   BUN 18 11/27/2018   BUN 3 (L) 10/14/2014   CREATININE 0.99 11/27/2018   CREATININE 0.68 10/14/2014   PROT 8.3 (H) 11/25/2018   PROT 5.7 (L)  10/12/2014   ALBUMIN 3.1 (L) 11/25/2018   ALBUMIN 2.7 (L) 10/12/2014   BILITOT 0.7 11/25/2018   BILITOT 0.8 10/12/2014   ALKPHOS 110 11/25/2018   ALKPHOS 78 10/12/2014   AST 26 11/25/2018   AST 42 (H) 10/12/2014   ALT 21 11/25/2018   ALT 95 (H) 10/12/2014  .   Total Time in preparing paper work, data evaluation and  todays exam - 35 minutes  Epifanio Lesches M.D on 11/27/2018 at 11:03 AM    Note: This dictation was prepared with Dragon dictation along with smaller phrase technology. Any transcriptional errors that result from this process are unintentional.

## 2018-11-27 NOTE — Progress Notes (Signed)
DISCHARGE NOTE:  Pt given discharge instructions, pt verbalized understanding. Pt safely ambulated to bathroom. Pt wheeled to car by staff.

## 2018-11-27 NOTE — Progress Notes (Signed)
Pt. C/o severe pain on her back. Dr. Jannifer Franklin notified and received PRN pain medicine for her. Order already implemented. Will continue to monitor.

## 2018-11-28 ENCOUNTER — Telehealth: Payer: Self-pay

## 2018-11-28 LAB — URINE CULTURE
MICRO NUMBER:: 332842
SPECIMEN QUALITY:: ADEQUATE

## 2018-11-28 NOTE — Telephone Encounter (Signed)
Transition Care Management Follow-up Telephone Call  Admission date:  11/26/2018   Discharge Date:  11/27/2018  Principal Problem: Sepsis   How have you been since you were released from the hospital? "I'm much better" Pt reports asymptomatic.    Do you understand why you were in the hospital? yes, "UTI"   Do you understand the discharge instructions? yes   Where were you discharged to? Home. Resides with husband.    Items Reviewed:  Medications reviewed: yes  Allergies reviewed: yes  Dietary changes reviewed: yes  Referrals reviewed: yes   Functional Questionnaire:   Activities of Daily Living (ADLs):   She states they are independent in the following: ambulation, bathing and hygiene, feeding, continence, grooming, toileting and dressing States they require assistance with the following: None.    Any transportation issues/concerns?: no   Any patient concerns? no   Confirmed importance and date/time of follow-up visits scheduled yes  Provider Appointment booked with PCP 12/08/2018 at 0920 (originally scheduled as 3 week f/u).   Confirmed with patient if condition begins to worsen call PCP or go to the ER.  Patient was given the office number and encouraged to call back with question or concerns.  : yes

## 2018-11-30 LAB — COMPLIANCE DRUG ANALYSIS, UR

## 2018-11-30 NOTE — Telephone Encounter (Signed)
Noted and reviewed.

## 2018-12-01 ENCOUNTER — Other Ambulatory Visit: Payer: Self-pay | Admitting: Nurse Practitioner

## 2018-12-08 ENCOUNTER — Ambulatory Visit (INDEPENDENT_AMBULATORY_CARE_PROVIDER_SITE_OTHER): Payer: Medicare Other | Admitting: Primary Care

## 2018-12-08 ENCOUNTER — Encounter: Payer: Self-pay | Admitting: Primary Care

## 2018-12-08 ENCOUNTER — Other Ambulatory Visit: Payer: Self-pay

## 2018-12-08 VITALS — BP 126/78 | HR 128 | Temp 98.1°F | Ht 63.0 in | Wt 128.0 lb

## 2018-12-08 DIAGNOSIS — E1165 Type 2 diabetes mellitus with hyperglycemia: Secondary | ICD-10-CM | POA: Diagnosis not present

## 2018-12-08 DIAGNOSIS — I495 Sick sinus syndrome: Secondary | ICD-10-CM

## 2018-12-08 DIAGNOSIS — I480 Paroxysmal atrial fibrillation: Secondary | ICD-10-CM

## 2018-12-08 DIAGNOSIS — M542 Cervicalgia: Secondary | ICD-10-CM | POA: Diagnosis not present

## 2018-12-08 DIAGNOSIS — M5441 Lumbago with sciatica, right side: Secondary | ICD-10-CM

## 2018-12-08 DIAGNOSIS — M5442 Lumbago with sciatica, left side: Secondary | ICD-10-CM | POA: Diagnosis not present

## 2018-12-08 DIAGNOSIS — G8929 Other chronic pain: Secondary | ICD-10-CM

## 2018-12-08 MED ORDER — METOPROLOL TARTRATE 25 MG PO TABS: 25.0000 mg | ORAL_TABLET | Freq: Two times a day (BID) | ORAL | 3 refills | Status: DC

## 2018-12-08 MED ORDER — INSULIN GLARGINE 100 UNIT/ML SOLOSTAR PEN: 30.0000 [IU] | PEN_INJECTOR | Freq: Every day | SUBCUTANEOUS | 5 refills | Status: DC

## 2018-12-08 MED ORDER — CYCLOBENZAPRINE HCL 10 MG PO TABS: 10.0000 mg | ORAL_TABLET | Freq: Three times a day (TID) | ORAL | 0 refills | Status: DC | PRN

## 2018-12-08 NOTE — Progress Notes (Signed)
Subjective:    Patient ID: [name], female    DOB: April 29, 1956, 63 y.o.   MRN: [id number]  HPI  [name] is a 63 year old female who presents today for [facility] Follow up and diabetes check.  1) Hospital Follow Up:   Originally evaluated in our office on 11/26/18 with reports of fatigue, vaginal swelling, changes to urine color, urinary frequency. Given her urinalysis coupled with presentation she was sent to the ED for treatment.  She presented to [facility] ED on 11/26/18 and met criteria for sepsis that was suspected to be secondary to UTI. She was initiated on treatment and admitted for further treatment. During her hospital stay she received IV antibiotics and fluids. Home medications were continued. She was discharged home on 11/27/18, per patient request, on a 7 day course of Augmentin. She was also diagnosed with acute bronchitis and sent home with an albuterol inhaler.   Since her hospital stay she's doing better, denies dysuria/frequency. She completed the Augmentin antibiotics. Urine culture returned with group strep B with predictability of PCN susceptibility. Her main concern today is her chronic neck and back pain. She was taking cyclobenzaprine 10 mg TID and did much better, would like a refill.  She has an appointment with pain management for next week but this is pending for reschedule due to Covid-19.  BP Readings from Last 3 Encounters:  12/08/18 126/78  11/27/18 104/67  11/26/18 130/82     2) Type 2 Diabetes:  Current medications include: Lantus 25 units nightly.   She is checking her blood glucose 3 times daily and is getting readings of: AM fasting: 349 today, 300-400's Before Lunch: 300's Before Bedtime: 300-400's  Last A1C: 9.4 in March 2020 Last Eye Exam: Last Foot Exam: Due in January 2021 Pneumonia Vaccination: Completed in 2016 ACE/ARB: None Statin: atorvastatin    Review of Systems  Constitutional: Negative for fever.  Respiratory:  Negative for shortness of breath.   Cardiovascular: Negative for chest pain.  Genitourinary: Negative for dysuria and frequency.  Neurological: Negative for dizziness and weakness.       Past Medical History:  Diagnosis Date  . Acute encephalopathy 05/22/2018  . Allergy   . Anxiety   . Ascites   . C. difficile colitis 07/10/2015  . Cancer (HCC)    HX OF CANCER OF UTERUS   . Cirrhosis of liver not due to alcohol ([place]) 2016  . Degenerative disk disease   . Diverticulitis   . Gastroparesis   . GERD (gastroesophageal reflux disease)   . History of hiatal hernia   . Hypertension   . Hypothyroid   . Hypothyroidism due to amiodarone   . Ileus ([place]) 08/01/2015  . Intussusception intestine ([place]) 05/2015  . Orthostatic hypotension   . PAF (paroxysmal atrial fibrillation) ([place]) 03/2015   a. new onset 03/2015 in setting of intractable N/V; b. on Eliquis 5 mg bid; c. CHADSVASc 4 (DM, TIA x 2, female)  . Pancreatitis   . Pneumonia 11/14/2015  . Right ureteral stone 07/14/2016  . Sick sinus syndrome ([place])   . Stomach ulcer   . Stroke [facility])    with minimal left sided weakness  . Syncope 01/2015  . Syncope due to orthostatic hypotension 05/18/2015  . Tachyarrhythmia 01/10/2016  . TIA (transient ischemic attack) 02/2015  . Type 1 diabetes ([place])    on levemir  . UTERINE CANCER, HX OF 03/27/2007   Qualifier: Diagnosis of  By: Maxie Better FNP, Rosalita Levan   .  UTI (urinary tract infection) 05/22/2018     Social History   Socioeconomic History  . Marital status: Married    Spouse name: Not on file  . Number of children: Not on file  . Years of education: Not on file  . Highest education level: Not on file  Occupational History  . Occupation: Disabled 2nd back problems  Social Needs  . Financial resource strain: Not on file  . Food insecurity:    Worry: Not on file    Inability: Not on file  . Transportation needs:    Medical: Not on file    Non-medical: Not on file  Tobacco Use  .  Smoking status: Former Smoker    Types: Cigarettes  . Smokeless tobacco: Never Used  . Tobacco comment: 25 years ago and only smoked occasionally  Substance and Sexual Activity  . Alcohol use: No  . Drug use: No  . Sexual activity: Not Currently  Lifestyle  . Physical activity:    Days per week: Not on file    Minutes per session: Not on file  . Stress: Not on file  Relationships  . Social connections:    Talks on phone: Not on file    Gets together: Not on file    Attends religious service: Not on file    Active member of club or organization: Not on file    Attends meetings of clubs or organizations: Not on file    Relationship status: Not on file  . Intimate partner violence:    Fear of current or ex partner: Not on file    Emotionally abused: Not on file    Physically abused: Not on file    Forced sexual activity: Not on file  Other Topics Concern  . Not on file  Social History Narrative   Lives in [place], [place] with her husband and 2 sons.    Past Surgical History:  Procedure Laterality Date  . ABDOMINAL HYSTERECTOMY    . CARDIAC CATHETERIZATION N/A 01/12/2016   Procedure: Left Heart Cath and Coronary Angiography;  Surgeon: Wellington Hampshire, MD;  Location: [place] CV LAB;  Service: Cardiovascular;  Laterality: N/A;  . CHOLECYSTECTOMY    . CYSTOSCOPY/URETEROSCOPY/HOLMIUM LASER Right 07/14/2016   Procedure: CYSTOSCOPY/URETEROSCOPY/HOLMIUM LASER;  Surgeon: Alexis Frock, MD;  Location: [facility] ORS;  Service: Urology;  Laterality: Right;  . ESOPHAGOGASTRODUODENOSCOPY N/A 04/04/2015   Procedure: ESOPHAGOGASTRODUODENOSCOPY (EGD);  Surgeon: Hulen Luster, MD;  Location: [facility] ENDOSCOPY;  Service: Endoscopy;  Laterality: N/A;  . ESOPHAGOGASTRODUODENOSCOPY N/A 12/28/2017   Procedure: ESOPHAGOGASTRODUODENOSCOPY (EGD);  Surgeon: Lin Landsman, MD;  [facility] ENDOSCOPY;  Service: Gastroenterology;  Laterality: N/A;  . ESOPHAGOGASTRODUODENOSCOPY (EGD) WITH PROPOFOL N/A  01/18/2016   Procedure: ESOPHAGOGASTRODUODENOSCOPY (EGD) WITH PROPOFOL;  Surgeon: Lucilla Lame, MD;  Location: [facility] ENDOSCOPY;  Service: Endoscopy;  Laterality: N/A;  . FLEXIBLE SIGMOIDOSCOPY N/A 01/18/2016   Procedure: FLEXIBLE SIGMOIDOSCOPY;  Surgeon: Lucilla Lame, MD;  Location: [facility] ENDOSCOPY;  Service: Endoscopy;  Laterality: N/A;  . HERNIA REPAIR      Family History  Problem Relation Age of Onset  . Hypertension Mother   . CAD Sister   . Heart attack Sister        Deceased 2014/11/08  . CAD Brother     Allergies  Allergen Reactions  . Hydrocodone Other (See Comments)    Pt states that this medication caused cirrhosis of the liver.    . Aspirin   . Erythromycin Other (See Comments)    Reaction:  Fever   . Prednisone Other (See Comments)    Reaction:  Unknown   . Rosiglitazone Maleate Swelling  . Codeine Sulfate Rash  . Tetanus-Diphtheria Toxoids Td Rash and Other (See Comments)    Reaction:  Fever     Current Outpatient Medications on File Prior to Visit  Medication Sig Dispense Refill  . acetaminophen (TYLENOL) 325 MG tablet Take 650 mg by mouth every 6 (six) hours as needed.    Marland Kitchen albuterol (PROVENTIL HFA;VENTOLIN HFA) 108 (90 Base) MCG/ACT inhaler Inhale 2 puffs into the lungs every 6 (six) hours as needed for wheezing or shortness of breath. 1 Inhaler 2  . apixaban (ELIQUIS) 5 MG TABS tablet Take 5 mg by mouth 2 (two) times daily.    Marland Kitchen atorvastatin (LIPITOR) 40 MG tablet Take 40 mg by mouth daily.    . Blood Glucose Monitoring Suppl (CONTOUR BLOOD GLUCOSE SYSTEM) w/Device KIT Use to test blood sugar 3 times daily. Dx is E11.65 1 each 0  . glucose blood test strip Contour Test Strips. Use as instructed to test blood sugar 3 times daily. 100 each 2  . Lancet Device MISC Use as instructed to test blood sugar 3 times daily. Dx is E11.65 100 each 2   No current facility-administered medications on file prior to visit.     BP 126/78   Pulse (!) 128   Temp 98.1 F (36.7 C)  (Oral)   Ht 5' 3"  (1.6 m)   Wt 128 lb (58.1 kg)   SpO2 99%   BMI 22.67 kg/m    Objective:   Physical Exam  Constitutional: She appears well-nourished.  Neck: Neck supple.  Cardiovascular: Normal rate and regular rhythm.  Respiratory: Effort normal and breath sounds normal.  Skin: Skin is warm and dry.  Psychiatric: She has a normal mood and affect.           Assessment & Plan:

## 2018-12-08 NOTE — Assessment & Plan Note (Signed)
Continues with tachycardia in the 120's. Increase metoprolol to 25 mg BID.  Will have her monitor BP and HR at home. Follow up in 1 month.

## 2018-12-08 NOTE — Assessment & Plan Note (Signed)
Will be following with pain management, she may have to push back her appointment due to Covid-19. Refill provided for cyclobenzaprine until she can be seen.

## 2018-12-08 NOTE — Patient Instructions (Addendum)
We've increased the dose of your Lantus insulin to 30 units at bedtime.  Continue to check your blood sugars 2-3 times daily as discussed. Please call me in 2 weeks if your blood sugars stay above 250 consistently.  We've increased your metoprolol to 25 mg (1 tablet) twice daily for heart rate.  You may take the cyclobenzaprine three times daily as needed for muscle spasms.   Schedule a visit with pain management as discussed.  Please schedule a follow up appointment in 1 month.  It was a pleasure to see you today!

## 2018-12-08 NOTE — Assessment & Plan Note (Signed)
Refill provided for cyclobenzaprine until she can be seen by pain management.

## 2018-12-08 NOTE — Assessment & Plan Note (Signed)
Readings still in the 300-400 range mostly, she did bring in her log sheets today. A1C from last visit did show improvement. Will increase Lantus to 30 units HS. Discussed to continue to monitor glucose readings, extra log sheets provided. We will see her back in 1 month, she will call if readings remain at or above 250.

## 2018-12-11 ENCOUNTER — Telehealth: Payer: Self-pay

## 2018-12-11 NOTE — Telephone Encounter (Signed)
I spoke with Dr Einar Pheasant and Dr Silvio Pate in [name] NP absence. Was advised for pt to drink 2L of water this afternoon and drink another 2 L of water this evening if possible. Pt is to take lantus 10 units now and also take regular dose of Lantus 30 units at hs tonight. If pt feels faint, extremely lightheaded,CP pt will go to ED via ambulance. Otherwise pt scheduled appt with [name] NP 12/12/18 at 8:20; pt will be fasting. Pt voiced understanding and will cb if needed. Pt does not have fever, cough or SOB; has not traveled or no known exposer to positive covid or flu in 14 days. Pt has been staying at home. FYI to Dr Einar Pheasant, Dr Silvio Pate and [name] NP.

## 2018-12-11 NOTE — Telephone Encounter (Addendum)
Pt was seen 12/08/18 and increased Lantus to 30 units at hs. On 12/09/18 FBS 397          Lunch 329                                HS 405  On 12/10/18 FBS 461                      Lunch 548                      HS 530  On 12/11/18 FBS was 447                      Lunch will not register reading except High.  Pt has been eating cheerios plain with milk for breakfast. For lunch and dinner pt has been eating peanut butter, ham and cheese,bologna and cheese sandwiches. Pt has been drinking lots of water. Only symptom pt has is increased thirst. Pt request cb with instructions. [name] NP and Vallarie Mare CMA are out of office.will sent to provider in office.pt has no transportation today.

## 2018-12-11 NOTE — Telephone Encounter (Signed)
Noted  

## 2018-12-12 ENCOUNTER — Other Ambulatory Visit: Payer: Self-pay

## 2018-12-12 ENCOUNTER — Ambulatory Visit (INDEPENDENT_AMBULATORY_CARE_PROVIDER_SITE_OTHER): Payer: Medicare Other | Admitting: Primary Care

## 2018-12-12 ENCOUNTER — Encounter: Payer: Self-pay | Admitting: Primary Care

## 2018-12-12 VITALS — BP 124/78 | HR 123 | Temp 98.0°F | Ht 63.0 in | Wt 128.5 lb

## 2018-12-12 DIAGNOSIS — I495 Sick sinus syndrome: Secondary | ICD-10-CM

## 2018-12-12 DIAGNOSIS — E1165 Type 2 diabetes mellitus with hyperglycemia: Secondary | ICD-10-CM

## 2018-12-12 DIAGNOSIS — I480 Paroxysmal atrial fibrillation: Secondary | ICD-10-CM

## 2018-12-12 DIAGNOSIS — I420 Dilated cardiomyopathy: Secondary | ICD-10-CM | POA: Diagnosis not present

## 2018-12-12 MED ORDER — INSULIN DETEMIR 100 UNIT/ML FLEXPEN: 20.0000 [IU] | PEN_INJECTOR | Freq: Two times a day (BID) | SUBCUTANEOUS | 5 refills | Status: DC

## 2018-12-12 NOTE — Assessment & Plan Note (Signed)
HR continuously above goal, she is compliant to the increased dose of metoprolol at 25 mg twice daily. Given cardiac history will send to cardiology for evaluation. Referral placed.

## 2018-12-12 NOTE — Progress Notes (Signed)
Subjective:    Patient ID: [name], female    DOB: [birth year], 63 y.o.   MRN: [id number]  HPI  Ms. Stout is a 63 year old female with a history of uncontrolled diabetes, paroxysmal atrial fibrillation, hypertension, tachycardia who presents today with reports of hyperglycemia.  She phoned into the office yesterday with reports of glucose readings ranging mid 400's-low 500's. She was just in for a follow up visit for diabetes four days prior with hyperglycemia and her Lantus was increased to 30 units. I was out of the office at the time of her phone call yesterday so another provider recommended she inject 10 units of Lantus immediately, drink 2 liters of water, and continue with her 30 units as scheduled for bedtime.   Today she's with her glucose logs, glucose reading of 525 this morning before breakfast. She did inject the 10 units of Lantus as recommended at the time, and also her 30 units at bedtime. She was once on Levemir which worked better but her insurance would not cover. She thinks she can use her Medicaid to get the Levemir now. She does have a history of hypoglycemia and will often skip meals. She endorses eating two small meals most of the time daily.   Review of Systems  Constitutional: Positive for fatigue.  Eyes: Negative for visual disturbance.  Respiratory: Negative for shortness of breath.   Cardiovascular: Negative for chest pain.  Neurological: Negative for dizziness and headaches.       Past Medical History:  Diagnosis Date  . Acute encephalopathy 05/22/2018  . Allergy   . Anxiety   . Ascites   . C. difficile colitis 07/10/2015  . Cancer (HCC)    HX OF CANCER OF UTERUS   . Cirrhosis of liver not due to alcohol ([place]) 2016  . Degenerative disk disease   . Diverticulitis   . Gastroparesis   . GERD (gastroesophageal reflux disease)   . History of hiatal hernia   . Hypertension   . Hypothyroid   . Hypothyroidism due to amiodarone   . Ileus ([place])  08/01/2015  . Intussusception intestine ([place]) 05/2015  . Orthostatic hypotension   . PAF (paroxysmal atrial fibrillation) ([place]) 03/2015   a. new onset 03/2015 in setting of intractable N/V; b. on Eliquis 5 mg bid; c. CHADSVASc 4 (DM, TIA x 2, female)  . Pancreatitis   . Pneumonia 11/14/2015  . Right ureteral stone 07/14/2016  . Sick sinus syndrome ([place])   . Stomach ulcer   . Stroke [facility])    with minimal left sided weakness  . Syncope 01/2015  . Syncope due to orthostatic hypotension 05/18/2015  . Tachyarrhythmia 01/10/2016  . TIA (transient ischemic attack) 02/2015  . Type 1 diabetes ([place])    on levemir  . UTERINE CANCER, HX OF 03/27/2007   Qualifier: Diagnosis of  By: Maxie Better FNP, Rosalita Levan   . UTI (urinary tract infection) 05/22/2018     Social History   Socioeconomic History  . Marital status: Married    Spouse name: Not on file  . Number of children: Not on file  . Years of education: Not on file  . Highest education level: Not on file  Occupational History  . Occupation: Disabled 2nd back problems  Social Needs  . Financial resource strain: Not on file  . Food insecurity:    Worry: Not on file    Inability: Not on file  . Transportation needs:    Medical: Not on file  Non-medical: Not on file  Tobacco Use  . Smoking status: Former Smoker    Types: Cigarettes  . Smokeless tobacco: Never Used  . Tobacco comment: 25 years ago and only smoked occasionally  Substance and Sexual Activity  . Alcohol use: No  . Drug use: No  . Sexual activity: Not Currently  Lifestyle  . Physical activity:    Days per week: Not on file    Minutes per session: Not on file  . Stress: Not on file  Relationships  . Social connections:    Talks on phone: Not on file    Gets together: Not on file    Attends religious service: Not on file    Active member of club or organization: Not on file    Attends meetings of clubs or organizations: Not on file    Relationship status: Not on file   . Intimate partner violence:    Fear of current or ex partner: Not on file    Emotionally abused: Not on file    Physically abused: Not on file    Forced sexual activity: Not on file  Other Topics Concern  . Not on file  Social History Narrative   Lives in [place], [place] with her husband and 2 sons.    Past Surgical History:  Procedure Laterality Date  . ABDOMINAL HYSTERECTOMY    . CARDIAC CATHETERIZATION N/A 01/12/2016   Procedure: Left Heart Cath and Coronary Angiography;  Surgeon: Wellington Hampshire, MD;  Location: [place] CV LAB;  Service: Cardiovascular;  Laterality: N/A;  . CHOLECYSTECTOMY    . CYSTOSCOPY/URETEROSCOPY/HOLMIUM LASER Right 07/14/2016   Procedure: CYSTOSCOPY/URETEROSCOPY/HOLMIUM LASER;  Surgeon: Alexis Frock, MD;  Location: [facility] ORS;  Service: Urology;  Laterality: Right;  . ESOPHAGOGASTRODUODENOSCOPY N/A 04/04/2015   Procedure: ESOPHAGOGASTRODUODENOSCOPY (EGD);  Surgeon: Hulen Luster, MD;  Location: [facility] ENDOSCOPY;  Service: Endoscopy;  Laterality: N/A;  . ESOPHAGOGASTRODUODENOSCOPY N/A 12/28/2017   Procedure: ESOPHAGOGASTRODUODENOSCOPY (EGD);  Surgeon: Lin Landsman, MD;  Location: [facility] ENDOSCOPY;  Service: Gastroenterology;  Laterality: N/A;  . ESOPHAGOGASTRODUODENOSCOPY (EGD) WITH PROPOFOL N/A 01/18/2016   Procedure: ESOPHAGOGASTRODUODENOSCOPY (EGD) WITH PROPOFOL;  Surgeon: Lucilla Lame, MD;  Location: [facility] ENDOSCOPY;  Service: Endoscopy;  Laterality: N/A;  . FLEXIBLE SIGMOIDOSCOPY N/A 01/18/2016   Procedure: FLEXIBLE SIGMOIDOSCOPY;  Surgeon: Lucilla Lame, MD;  Location: [facility] ENDOSCOPY;  Service: Endoscopy;  Laterality: N/A;  . HERNIA REPAIR      Family History  Problem Relation Age of Onset  . Hypertension Mother   . CAD Sister   . Heart attack Sister        Deceased 2014/11/25  . CAD Brother     Allergies  Allergen Reactions  . Hydrocodone Other (See Comments)    Pt states that this medication caused cirrhosis of the liver.    . Aspirin   .  Erythromycin Other (See Comments)    Reaction:  Fever   . Prednisone Other (See Comments)    Reaction:  Unknown   . Rosiglitazone Maleate Swelling  . Codeine Sulfate Rash  . Tetanus-Diphtheria Toxoids Td Rash and Other (See Comments)    Reaction:  Fever     Current Outpatient Medications on File Prior to Visit  Medication Sig Dispense Refill  . acetaminophen (TYLENOL) 325 MG tablet Take 650 mg by mouth every 6 (six) hours as needed.    Marland Kitchen albuterol (PROVENTIL HFA;VENTOLIN HFA) 108 (90 Base) MCG/ACT inhaler Inhale 2 puffs into the lungs every 6 (six) hours as needed for wheezing or  shortness of breath. 1 Inhaler 2  . apixaban (ELIQUIS) 5 MG TABS tablet Take 5 mg by mouth 2 (two) times daily.    Marland Kitchen atorvastatin (LIPITOR) 40 MG tablet Take 40 mg by mouth daily.    . Blood Glucose Monitoring Suppl (CONTOUR BLOOD GLUCOSE SYSTEM) w/Device KIT Use to test blood sugar 3 times daily. Dx is E11.65 1 each 0  . cyclobenzaprine (FLEXERIL) 10 MG tablet Take 1 tablet (10 mg total) by mouth 3 (three) times daily as needed for muscle spasms. 90 tablet 0  . glucose blood test strip Contour Test Strips. Use as instructed to test blood sugar 3 times daily. 100 each 2  . Lancet Device MISC Use as instructed to test blood sugar 3 times daily. Dx is E11.65 100 each 2  . metoprolol tartrate (LOPRESSOR) 25 MG tablet Take 1 tablet (25 mg total) by mouth 2 (two) times daily. For heart rate. 180 tablet 3   No current facility-administered medications on file prior to visit.     BP 124/78   Pulse (!) 123   Temp 98 F (36.7 C) (Oral)   Ht _0  (1.6 m)   Wt 128 lb 8 oz (58.3 kg)   SpO2 98%   BMI 22.76 kg/m    Objective:   Physical Exam  Constitutional: She is oriented to person, place, and time. She appears well-nourished.  Cardiovascular: Regular rhythm.  Sinus tachycardia   Respiratory: Effort normal and breath sounds normal.  Neurological: She is alert and oriented to person, place, and time.  Skin:  Skin is warm and dry.  Psychiatric: She has a normal mood and affect.           Assessment & Plan:

## 2018-12-12 NOTE — Assessment & Plan Note (Signed)
Hyperglycemia despite increases of Lantus. Discussed that it may take a few more days for her body to respond to the dose increase. She did do better with Levemir and thinks she can now afford.  Will switch to Levemir 20 units BID. If she cannot afford then we will increase Lantus to 40 units HS. She will notify us either way.  Continue glucose checks. We will follow up with her via phone in 2 weeks to check on readings. Warnings provided regarding hypoglycemia.

## 2018-12-12 NOTE — Patient Instructions (Signed)
Stop Lantus if you can get Levemir. If you can't get Levemir then increase your Lantus to 40 units at bedtime.  Start Levemir and inject 20 units into the skin twice daily.  Continue to monitor your blood sugars as discussed.   Schedule a phone visit up front for 2 weeks for blood sugar check.  You will be contacted regarding your referral to Cardiology.  Please let us know if you have not been contacted within one week.   It was a pleasure to see you today!

## 2018-12-17 ENCOUNTER — Other Ambulatory Visit: Payer: Self-pay

## 2018-12-17 ENCOUNTER — Ambulatory Visit: Payer: Medicare Other | Attending: Pain Medicine | Admitting: Pain Medicine

## 2018-12-17 DIAGNOSIS — R109 Unspecified abdominal pain: Secondary | ICD-10-CM

## 2018-12-17 DIAGNOSIS — M5126 Other intervertebral disc displacement, lumbar region: Secondary | ICD-10-CM | POA: Insufficient documentation

## 2018-12-17 DIAGNOSIS — M5442 Lumbago with sciatica, left side: Secondary | ICD-10-CM

## 2018-12-17 DIAGNOSIS — M797 Fibromyalgia: Secondary | ICD-10-CM

## 2018-12-17 DIAGNOSIS — M542 Cervicalgia: Secondary | ICD-10-CM

## 2018-12-17 DIAGNOSIS — Z8673 Personal history of transient ischemic attack (TIA), and cerebral infarction without residual deficits: Secondary | ICD-10-CM | POA: Insufficient documentation

## 2018-12-17 DIAGNOSIS — M5441 Lumbago with sciatica, right side: Secondary | ICD-10-CM

## 2018-12-17 DIAGNOSIS — G8929 Other chronic pain: Secondary | ICD-10-CM | POA: Insufficient documentation

## 2018-12-17 DIAGNOSIS — M5136 Other intervertebral disc degeneration, lumbar region: Secondary | ICD-10-CM | POA: Diagnosis not present

## 2018-12-17 DIAGNOSIS — E1165 Type 2 diabetes mellitus with hyperglycemia: Secondary | ICD-10-CM | POA: Diagnosis present

## 2018-12-17 DIAGNOSIS — G894 Chronic pain syndrome: Secondary | ICD-10-CM

## 2018-12-17 DIAGNOSIS — E876 Hypokalemia: Secondary | ICD-10-CM

## 2018-12-17 DIAGNOSIS — M79605 Pain in left leg: Secondary | ICD-10-CM

## 2018-12-17 DIAGNOSIS — E559 Vitamin D deficiency, unspecified: Secondary | ICD-10-CM

## 2018-12-17 DIAGNOSIS — M79604 Pain in right leg: Secondary | ICD-10-CM | POA: Diagnosis not present

## 2018-12-17 DIAGNOSIS — Z9889 Other specified postprocedural states: Secondary | ICD-10-CM | POA: Insufficient documentation

## 2018-12-17 DIAGNOSIS — R9431 Abnormal electrocardiogram [ECG] [EKG]: Secondary | ICD-10-CM | POA: Diagnosis present

## 2018-12-17 DIAGNOSIS — M7918 Myalgia, other site: Secondary | ICD-10-CM

## 2018-12-17 DIAGNOSIS — R52 Pain, unspecified: Secondary | ICD-10-CM

## 2018-12-17 DIAGNOSIS — E871 Hypo-osmolality and hyponatremia: Secondary | ICD-10-CM

## 2018-12-17 MED ORDER — VITAMIN B-12 5000 MCG SL SUBL: 5000.0000 ug | SUBLINGUAL_TABLET | Freq: Every day | SUBLINGUAL | 5 refills | Status: DC

## 2018-12-17 MED ORDER — ERGOCALCIFEROL 1.25 MG (50000 UT) PO CAPS: 50000.0000 [IU] | ORAL_CAPSULE | ORAL | 1 refills | Status: DC

## 2018-12-17 MED ORDER — VITAMIN D3 125 MCG (5000 UT) PO CAPS: 1.0000 | ORAL_CAPSULE | Freq: Every day | ORAL | 0 refills | Status: DC

## 2018-12-17 MED ORDER — MAGNESIUM 500 MG PO CAPS: 500.0000 mg | ORAL_CAPSULE | Freq: Two times a day (BID) | ORAL | 5 refills | Status: DC

## 2018-12-17 MED ORDER — CYCLOBENZAPRINE HCL 10 MG PO TABS: 10.0000 mg | ORAL_TABLET | Freq: Three times a day (TID) | ORAL | 0 refills | Status: DC | PRN

## 2018-12-17 MED ORDER — CALCIUM CARBONATE 600 MG PO TABS: 600.0000 mg | ORAL_TABLET | Freq: Two times a day (BID) | ORAL | 5 refills | Status: DC

## 2018-12-17 NOTE — Progress Notes (Signed)
Patient's Name: [name]  MRN: [id number]  Referring Provider: [name], NP  DOB: Dec 12, 1955  PCP: [name], NP  DOS: 12/17/2018  Note by: Gaspar Cola, MD  Service setting: Virtual Visit (Telephone)  Attending: Gaspar Cola, MD  Location: Telephone Encounter  Specialty: Interventional Pain Management  Patient type: Established   Pain Management Encounter Note - Virtual Visit via Telephone Telehealth (real-time audio visits between healthcare provider and patient).  Patient's Phone No.:  [phone] (home); There is no such number on file (mobile).; (Preferred) [phone]  Pre-screening note:  Our staff contacted [name] and offered her an "in person", "face-to-face" appointment versus a telephone encounter. She indicated preferring the telephone encounter, at this time.   Primary Reason(s) for Virtual Visit: Encounter for evaluation before starting new chronic pain management plan of care (Level of risk: moderate) COVID-19*  Social distancing based on CDC ans AMA recommendations.   I contacted [name] on 12/17/2018 at 3:25 PM by telephone and clearly identified myself as Gaspar Cola, MD. I verified that I was speaking with the correct person using two identifiers (Name and date of birth: Apr 25, 1956).  Advanced Informed Consent I sought verbal advanced consent from [name] for telemedicine interactions and virtual visit. I informed [name] of the security and privacy concerns, risks, and limitations associated with performing an evaluation and management service by telephone. I also informed [name] of the availability of "in person" appointments and I informed her of the possibility of a patient responsible charge related to this service. [name] expressed understanding and agreed to proceed.   Historic Elements   [name] is a 63 y.o. year old, female patient evaluated today after her last encounter by our  practice on 11/25/2018. [name]  has a past medical history of Acute encephalopathy (05/22/2018), Allergy, Anxiety, Ascites, C. difficile colitis (07/10/2015), Cancer ([place]), Cirrhosis of liver not due to alcohol ([place]) (2016), Degenerative disk disease, Diverticulitis, Gastroparesis, GERD (gastroesophageal reflux disease), History of hiatal hernia, Hypertension, Hypothyroid, Hypothyroidism due to amiodarone, Ileus ([place]) (08/01/2015), Intussusception intestine ([place]) (05/2015), Orthostatic hypotension, PAF (paroxysmal atrial fibrillation) ([place]) (03/2015), Pancreatitis, Pneumonia (11/14/2015), Right ureteral stone (07/14/2016), Sick sinus syndrome ([place]), Stomach ulcer, Stroke ([place]), Syncope (01/2015), Syncope due to orthostatic hypotension (05/18/2015), Tachyarrhythmia (01/10/2016), TIA (transient ischemic attack) (02/2015), Type 1 diabetes ([place]), UTERINE CANCER, HX OF (03/27/2007), and UTI (urinary tract infection) (05/22/2018). She also  has a past surgical history that includes Hernia repair; Abdominal hysterectomy; Cholecystectomy; Esophagogastroduodenoscopy (N/A, 04/04/2015); Cardiac catheterization (N/A, 01/12/2016); Esophagogastroduodenoscopy (egd) with propofol (N/A, 01/18/2016); Flexible sigmoidoscopy (N/A, 01/18/2016); Cystoscopy/ureteroscopy/holmium laser (Right, 07/14/2016); and Esophagogastroduodenoscopy (N/A, 12/28/2017). [name] has a current medication list which includes the following prescription(s): acetaminophen, albuterol, apixaban, atorvastatin, contour blood glucose system, calcium carbonate, vitamin d3, vitamin b-12, cyclobenzaprine, ergocalciferol, glucose blood, insulin detemir, lancet device, magnesium, and metoprolol tartrate. She  reports that she has quit smoking. Her smoking use included cigarettes. She has never used smokeless tobacco. She reports that she does not drink alcohol or use drugs. [name] is allergic to hydrocodone; aspirin; erythromycin; prednisone; rosiglitazone maleate; codeine sulfate;  and tetanus-diphtheria toxoids td.   HPI  She is being evaluated for review of studies ordered on initial visit and to consider treatment plan options. Today I went over the results of her tests. These were explained in "Layman's terms". During today's appointment I went over my diagnostic impression, as well as the proposed treatment plan.  According to  the patient her primary area of pain is in her lower back.  She denies any precipitating factors.  She feels like the back pain is midline.  She denies any previous surgery or interventional therapy.  She is currently doing home physical therapy.  She has had recent images.  Her second area of pain is in her legs.  He admits that the back pain goes down both legs and stops at the knee.  The pain goes down the back of both legs.  She denies any numbness or tingling.  She does have weakness.  Her third area of pain is in her neck.  She describes it as midline pain.  She admits that it goes out to both shoulders.  She denies any arm pain.  She denies any previous surgery or interventional therapy.  Again she is currently in home physical therapy.  She has had recent images.  In considering the treatment plan options, [name] was reminded that I no longer take patients for medication management only. I asked her to let me know if she had no intention of taking advantage of the interventional therapies, so that we could make arrangements to provide this space to someone interested. I also made it clear that undergoing interventional therapies for the purpose of getting pain medications is very inappropriate on the part of a patient, and it will not be tolerated in this practice. This type of behavior would suggest true addiction and therefore it requires referral to an addiction specialist.   I discussed the assessment and treatment plan with the patient. The patient was provided an opportunity to ask questions and all were answered. The patient agreed  with the plan and demonstrated an understanding of the instructions.  Patient advised to call back or seek an in-person evaluation if the symptoms or condition worsens.  Controlled Substance Pharmacotherapy Assessment REMS (Risk Evaluation and Mitigation Strategy)  Analgesic: None Highest recorded MME/day: 90 mg/day MME/day: 0 mg/day   Monitoring: [place] PMP: PDMP not reviewed this encounter.       Not applicable at this point since we have not taken over the patient's medication management yet. List of other Serum/Urine Drug Screening Test(s):  Lab Results  Component Value Date   COCAINSCRNUR NONE DETECTED 05/22/2018   COCAINSCRNUR NONE DETECTED 01/10/2016   COCAINSCRNUR NONE DETECTED 12/27/2015   COCAINSCRNUR NONE DETECTED 11/02/2007   THCU NONE DETECTED 05/22/2018   THCU NONE DETECTED 01/10/2016   THCU NONE DETECTED 12/27/2015   THCU NONE DETECTED 11/02/2007   ETH <10 05/22/2018   ETH  11/02/2007    6        LOWEST DETECTABLE LIMIT FOR SERUM ALCOHOL IS 11 mg/dL FOR MEDICAL PURPOSES ONLY   List of all UDS test(s) done:  Lab Results  Component Value Date   TOXASSSELUR FINAL 10/21/2015   SUMMARY FINAL 11/25/2018   Last UDS on record: ToxAssure Select 13  Date Value Ref Range Status  10/21/2015 FINAL  Final    Comment:    ==================================================================== TOXASSURE SELECT 13 (MW) ==================================================================== Test                             Result       Flag       Units Drug Present and Declared for Prescription Verification   Morphine                       [phone]  EXPECTED   ng/mg creat    Potential sources of large amounts of morphine in the absence of    codeine include administration of morphine or use of heroin.   Hydromorphone                  622          EXPECTED   ng/mg creat    Hydromorphone may be present as a metabolite of morphine;    concentrations of hydromorphone rarely exceed  5% of the morphine    concentration when this is the source of hydromorphone. Drug Present not Declared for Prescription Verification   Alcohol, Ethyl                 0.094        UNEXPECTED g/dL    Sources of ethyl alcohol include alcoholic beverages or as a    fermentation product of glucose; glucose is present in this    specimen.  Interpret result with caution, as the presence of    ethyl alcohol is likely due, at least in part, to fermentation of    glucose. Drug Absent but Declared for Prescription Verification   Oxycodone                      Not Detected UNEXPECTED ng/mg creat ==================================================================== Test                      Result    Flag   Units      Ref Range   Creatinine              67               mg/dL      >=20 ==================================================================== Declared Medications:  The flagging and interpretation on this report are based on the  following declared medications.  Unexpected results may arise from  inaccuracies in the declared medications.  **Note: The testing scope of this panel includes these medications:  Morphine  Oxycodone  **Note: The testing scope of this panel does not include following  reported medications:  Zolpidem ==================================================================== For clinical consultation, please call [phone]. ====================================================================    Summary  Date Value Ref Range Status  11/25/2018 FINAL  Final    Comment:    ==================================================================== TOXASSURE COMP DRUG ANALYSIS,UR ==================================================================== Test                             Result       Flag       Units Drug Present and Declared for Prescription Verification   Acetaminophen                  PRESENT      EXPECTED Drug Present not Declared for Prescription  Verification   Alcohol, Ethyl                 0.297        UNEXPECTED g/dL    Sources of ethyl alcohol include alcoholic beverages or as a    fermentation product of glucose; glucose is present in this    specimen.  Interpret result with caution, as the presence of    ethyl alcohol is likely due, at least in part, to fermentation of    glucose.   Cyclobenzaprine                PRESENT  UNEXPECTED   Desmethylcyclobenzaprine       PRESENT      UNEXPECTED    Desmethylcyclobenzaprine is an expected metabolite of    cyclobenzaprine.   Salicylate                     PRESENT      UNEXPECTED   Diphenhydramine                PRESENT      UNEXPECTED Drug Absent but Declared for Prescription Verification   Ibuprofen                      Not Detected UNEXPECTED    Ibuprofen, as indicated in the declared medication list, is not    always detected even when used as directed.   Metoprolol                     Not Detected UNEXPECTED ==================================================================== Test                      Result    Flag   Units      Ref Range   Creatinine              114              mg/dL      >=20 ==================================================================== Declared Medications:  The flagging and interpretation on this report are based on the  following declared medications.  Unexpected results may arise from  inaccuracies in the declared medications.  **Note: The testing scope of this panel includes these medications:  Metoprolol  **Note: The testing scope of this panel does not include small to  moderate amounts of these reported medications:  Acetaminophen  Ibuprofen  **Note: The testing scope of this panel does not include following  reported medications:  Apixaban  Atorvastatin  Insulin (Lantus) ==================================================================== For clinical consultation, please call [phone]. ====================================================================    UDS interpretation: No unexpected findings.          Medication Assessment Form: Patient introduced to form today Treatment compliance: Treatment may start today if patient agrees with proposed plan. Evaluation of compliance is not applicable at this point Risk Assessment Profile: Aberrant behavior: See initial evaluations. None observed or detected today Comorbid factors increasing risk of overdose: See initial evaluation. No additional risks detected today Opioid risk tool (ORT):  Opioid Risk  11/25/2018  Alcohol 1  Illegal Drugs 0  Rx Drugs 0  Alcohol 0  Illegal Drugs 0  Rx Drugs 0  Age between 16-45 years  0  History of Preadolescent Sexual Abuse 0  Psychological Disease 0  Depression 0  Opioid Risk Tool Scoring 1  Opioid Risk Interpretation Low Risk    ORT Scoring interpretation table:  Score <3 = Low Risk for SUD  Score between 4-7 = Moderate Risk for SUD  Score >8 = High Risk for Opioid Abuse   Risk of substance use disorder (SUD): Low  Risk Mitigation Strategies:  Patient opioid safety counseling: Completed today. Counseling provided to patient as per "Patient Counseling Document". Document signed by patient, attesting to counseling and understanding Patient-Prescriber Agreement (PPA): Obtained today.  Controlled substance notification to other providers: Written and sent today.  Pharmacologic Plan: Today we may be taking over the patient's pharmacological regimen. See below.             Meds   Current Outpatient Medications:  .  acetaminophen (TYLENOL) 325 MG tablet, Take 650 mg by mouth every 6 (six) hours as needed., Disp: , Rfl:  .  albuterol (PROVENTIL HFA;VENTOLIN HFA) 108 (90 Base) MCG/ACT inhaler, Inhale 2 puffs into the lungs every 6 (six) hours as needed for wheezing or shortness of breath., Disp: 1 Inhaler, Rfl: 2 .  apixaban (ELIQUIS) 5 MG TABS tablet, Take 5 mg by mouth 2  (two) times daily., Disp: , Rfl:  .  atorvastatin (LIPITOR) 40 MG tablet, Take 40 mg by mouth daily., Disp: , Rfl:  .  Blood Glucose Monitoring Suppl (CONTOUR BLOOD GLUCOSE SYSTEM) w/Device KIT, Use to test blood sugar 3 times daily. Dx is E11.65, Disp: 1 each, Rfl: 0 .  calcium carbonate (CALCIUM 600) 600 MG TABS tablet, Take 1 tablet (600 mg total) by mouth 2 (two) times daily with a meal., Disp: 60 tablet, Rfl: 5 .  Cholecalciferol (VITAMIN D3) 125 MCG (5000 UT) CAPS, Take 1 capsule (5,000 Units total) by mouth daily with breakfast. Take along with calcium and magnesium., Disp: 180 capsule, Rfl: 0 .  Cyanocobalamin (VITAMIN B-12) 5000 MCG SUBL, Place 1 tablet (5,000 mcg total) under the tongue daily., Disp: 30 tablet, Rfl: 5 .  [START ON 01/01/2019] cyclobenzaprine (FLEXERIL) 10 MG tablet, Take 1 tablet (10 mg total) by mouth 3 (three) times daily as needed for up to 30 days for muscle spasms. Must last 30 days., Disp: 90 tablet, Rfl: 0 .  [START ON 12/18/2018] ergocalciferol (VITAMIN D2) 1.25 MG (50000 UT) capsule, Take 1 capsule (50,000 Units total) by mouth 2 (two) times a week. X 6 weeks., Disp: 12 capsule, Rfl: 1 .  glucose blood test strip, Contour Test Strips. Use as instructed to test blood sugar 3 times daily., Disp: 100 each, Rfl: 2 .  Insulin Detemir (LEVEMIR) 100 UNIT/ML Pen, Inject 20 Units into the skin 2 (two) times daily., Disp: 15 mL, Rfl: 5 .  Lancet Device MISC, Use as instructed to test blood sugar 3 times daily. Dx is E11.65, Disp: 100 each, Rfl: 2 .  Magnesium 500 MG CAPS, Take 1 capsule (500 mg total) by mouth 2 (two) times daily at 8 am and 10 pm., Disp: 60 capsule, Rfl: 5 .  metoprolol tartrate (LOPRESSOR) 25 MG tablet, Take 1 tablet (25 mg total) by mouth 2 (two) times daily. For heart rate., Disp: 180 tablet, Rfl: 3  Laboratory Chemistry  Inflammation Markers (CRP: Acute Phase) (ESR: Chronic Phase) Lab Results  Component Value Date   CRP 10.8 (H) 11/25/2018   ESRSEDRATE  79 (H) 11/25/2018   LATICACIDVEN 1.6 11/26/2018                         Rheumatology Markers Lab Results  Component Value Date   ANA Negative 07/07/2015                        Renal Function Markers Lab Results  Component Value Date   BUN 18 11/27/2018   CREATININE 0.99 11/27/2018   GFRAA >60 11/27/2018   GFRNONAA >60 11/27/2018                             Hepatic Function Markers Lab Results  Component Value Date   AST 26 11/25/2018   ALT 21 11/25/2018   ALBUMIN 3.1 (L) 11/25/2018   ALKPHOS 110 11/25/2018   HCVAB 0.2 09/25/2018   AMYLASE 28  08/01/2015   LIPASE 15 09/25/2018   AMMONIA 28 09/25/2018                        Electrolytes Lab Results  Component Value Date   NA 132 (L) 11/27/2018   K 3.2 (L) 11/27/2018   CL 104 11/27/2018   CALCIUM 7.6 (L) 11/27/2018   MG 1.8 11/25/2018   PHOS 2.2 (L) 05/23/2018                        Neuropathy Markers Lab Results  Component Value Date   VITAMINB12 546 11/25/2018   FOLATE 16.2 11/11/2015   HGBA1C 9.4 (A) 11/17/2018   HIV Non Reactive 12/28/2017                        CNS Tests No results found.  Bone Pathology Markers Lab Results  Component Value Date   VD25OH 4.7 (L) 11/25/2018                         Coagulation Parameters Lab Results  Component Value Date   INR 1.3 (H) 11/26/2018   LABPROT 16.2 (H) 11/26/2018   APTT 34 05/22/2018   PLT 211 11/27/2018   DDIMER  10/26/2007    0.24        AT THE INHOUSE ESTABLISHED CUTOFF VALUE OF 0.48 ug/mL FEU, THIS ASSAY HAS BEEN DOCUMENTED IN THE LITERATURE TO HAVE                        Cardiovascular Markers Lab Results  Component Value Date   BNP 247.0 (H) 01/10/2016   CKTOTAL 46 04/05/2015   CKMB 1.4 10/13/2014   TROPONINI <0.03 11/26/2018   HGB 9.6 (L) 11/27/2018   HCT 30.8 (L) 11/27/2018                         ID Markers Lab Results  Component Value Date   HIV Non Reactive 12/28/2017                        CA Markers Lab Results   Component Value Date   CEA 4.4 01/22/2016                        Endocrine Markers Lab Results  Component Value Date   TSH 5.301 (H) 09/25/2018   FREET4 0.98 09/25/2018                        Note: Lab results reviewed.  Recent Diagnostic Imaging Review  Cervical Imaging: Cervical MR wo contrast:  Results for orders placed during the hospital encounter of 10/04/15  MR Cervical Spine Wo Contrast   Narrative CLINICAL DATA:  63 year old female with onset of dizziness 4 days ago and fall 2 days ago hitting head. Altered speech. Left-sided weakness. Subsequent encounter.  EXAM: MRI CERVICAL SPINE WITHOUT CONTRAST  TECHNIQUE: Multiplanar, multisequence MR imaging of the cervical spine was performed. No intravenous contrast was administered.  COMPARISON:  10/04/2015 brain MR. 05/27/2015 cervical spine CT.  FINDINGS: Exam is motion degraded.  Cervical medullary junction and visualized intracranial structures unremarkable. No obvious focal cervical cord signal abnormality.  Mild edema within the right C3 facet (series 4, images 1 and 2) raises possibility of  bone bruise or subtle fracture versus degenerative changes. No other findings of osseous or soft tissue edema to suggest acute injury.  C2-3:  Negative.  C3-4: Negative.  C4-5:  Minimal bulge.  C5-6: Minimal to mild bulge. Slight narrowing ventral thecal sac greater on the right. Mild right foraminal narrowing.  C6-7:  Minimal bulge.  C7-T1:  Negative.  IMPRESSION: Exam is motion degraded.  Mild edema within the right C3 facet raises possibility of bone bruise or subtle fracture (given history) versus degenerative changes. No other findings of osseous or soft tissue edema to suggest acute injury.  Minimal to mild degenerative changes most notable C5-6 level as detailed above.   Electronically Signed   By: Genia Del M.D.   On: 10/04/2015 19:18    Cervical CT wo contrast:  Results for orders  placed during the hospital encounter of 08/18/18  CT Cervical Spine Wo Contrast   Narrative CLINICAL DATA:  63 year old female status post fall backwards this morning striking head on floor.  EXAM: CT HEAD WITHOUT CONTRAST  CT CERVICAL SPINE WITHOUT CONTRAST  TECHNIQUE: Multidetector CT imaging of the head and cervical spine was performed following the standard protocol without intravenous contrast. Multiplanar CT image reconstructions of the cervical spine were also generated.  COMPARISON:  Head CT 05/22/2018. Brain MRI, head and neck MRA 12/29/2017.  FINDINGS: CT HEAD FINDINGS  Brain: No midline shift, ventriculomegaly, mass effect, evidence of mass lesion, intracranial hemorrhage or evidence of cortically based acute infarction. Gray-white matter differentiation is within normal limits throughout the brain.  Vascular: Calcified atherosclerosis at the skull base.  Skull: Stable and intact.  Sinuses/Orbits: Visualized paranasal sinuses and mastoids are stable and well pneumatized.  Other: No scalp soft tissue injury identified. Negative orbits.  CT CERVICAL SPINE FINDINGS  Alignment: Relatively preserved cervical lordosis. Cervicothoracic junction alignment is within normal limits. Bilateral posterior element alignment is within normal limits.  Skull base and vertebrae: Visualized skull base is intact. No atlanto-occipital dissociation. No cervical spine fracture identified. Small C7 cervical ribs (normal variant).  Soft tissues and spinal canal: No prevertebral fluid or swelling. No visible canal hematoma. Negative noncontrast neck soft tissues aside from calcified carotid atherosclerosis.  Disc levels: Mild to moderate upper cervical facet hypertrophy primarily on the left. Lower cervical disc and endplate degeneration, mild if any lower cervical spinal stenosis suspected.  Upper chest: Visible upper thoracic levels appear intact. Negative lung  apices.  IMPRESSION: 1. No acute traumatic injury identified in the head or cervical spine. 2. Stable and normal for age non contrast CT appearance of the brain.   Electronically Signed   By: Genevie Ann M.D.   On: 08/18/2018 18:34    Thoracic Imaging: Thoracic DG 2-3 views:  Results for orders placed during the hospital encounter of 08/18/18  DG Thoracic Spine 2 View   Narrative CLINICAL DATA:  Midthoracic spine pain. Multiple falls in the last 3 days.  EXAM: THORACIC SPINE 2 VIEWS  COMPARISON:  Chest x-ray dated 05/22/2018  FINDINGS: There is no evidence of thoracic spine fracture. Slight thoracolumbar scoliosis. No other significant bone abnormalities are identified.  IMPRESSION: No significant abnormality of the thoracic spine.   Electronically Signed   By: Lorriane Shire M.D.   On: 08/18/2018 17:37    Lumbosacral Imaging: Lumbar MR wo contrast:  Results for orders placed during the hospital encounter of 02/28/06  MR Lumbar Spine Wo Contrast   Narrative Clinical Data: Low back pain.  MRI LUMBAR SPINE WITHOUT CONTRAST:  Technique: Multiplanar and multiecho pulse sequences of the lumbar spine, to include the lower thoracic and upper sacral regions, were obtained according to standard protocol without IV contrast.  This study is interpreted accounting for five non-rib-bearing lumbar vertebrae with the last major disk level denoting L5-S1.  Conus terminates at L1, which is unremarkable. The paravertebral structures are normal.   Alignment of the spine is anatomic. Diffuse lumbar disk desiccation. No appreciable central nor foraminal stenosis. Facet degenerative changes in lower lumbar spine.   Axial imaging:  There is no disk herniation, central nor foraminal stenosis. Facet degenerative changes are appreciated at the L4-5 and L5-S1 levels. There is no central nor foraminal stenosis.  IMPRESSION:  Facet degenerative changes, lower lumbar spine.  Provider: Weyman Rodney   Lumbar DG 2-3 views:  Results for orders placed during the hospital encounter of 12/14/17  DG Lumbar Spine 2-3 Views   Narrative CLINICAL DATA:  Multiple recent falls.  Pain.  EXAM: LUMBAR SPINE - 2-3 VIEW  COMPARISON:  CT, 08/06/2016.  FINDINGS: Mild levoscoliosis, apex at L2. No fracture. No spondylolisthesis. Mild loss of disc height at L5-S1. No other degenerative change.  Scattered aortic vascular calcifications. Right ureteral stent appears stable from the prior CT.  IMPRESSION: 1. No fracture or acute finding. 2. Levoscoliosis and mild L5-S1 disc degenerative changes.   Electronically Signed   By: Lajean Manes M.D.   On: 12/15/2017 13:56          Lumbar DG (Complete) 4+V:  Results for orders placed during the hospital encounter of 10/30/07  DG Lumbar Spine Complete   Narrative Clinical Data: 63 year-old fell, back pain.  LUMBAR SPINE - 5 VIEW:  Comparison: None.  Findings: There is a left convex scoliotic curvature of the lumbar spine with a slight rotatory component. There is also mild osteoporosis. Lateral films demonstrate normal alignment of the lumbar vertebral bodies and the disk spaces and vertebral bodies are maintained. No fractures are seen. There are facet degenerative changes at L4-5 and L5-S1 but no pars defects are seen. Visualized bony sacrum appears normal.  IMPRESSION:  1. Mild rotatory scoliotic curvature.  2. No acute bony findings.  3. Mild osteoporosis.  Provider: Hampton Abbot         Hip Imaging: Hip-L MR wo contrast:  Results for orders placed during the hospital encounter of 08/18/18  MR HIP LEFT WO CONTRAST   Narrative CLINICAL DATA:  Left hip pain following 3 recent falls. No hip fracture visible on plain radiographs or CT earlier today.  EXAM: MR OF THE LEFT HIP WITHOUT CONTRAST  TECHNIQUE: Multiplanar, multisequence MR imaging was performed. No intravenous contrast was administered.  COMPARISON:  Pelvis and left  hip radiographs obtained earlier today. Abdomen and pelvis CT obtained earlier today.  FINDINGS: Normal appearing left hip with no fracture, dislocation or effusion. No surrounding or adjacent soft tissue edema. The right hip also has a normal appearance as do the pelvic bones. Mild lower lumbar spine dextroconvex scoliosis. Distal portion of a right ureteral stent in the urinary bladder. Unremarkable bowel loops. Previously noted mass-like densities in the retroperitoneal space on the right, inferior to the right kidney and adjacent to the right psoas muscle.  IMPRESSION: 1. Normal appearing left hip without fracture or dislocation. 2. Previously noted retroperitoneal mass-like densities inferior to the right kidney and adjacent to the right psoas muscle. These have MR signal characteristics similar to fluid in adjacent fluid-filled small bowel loops, suggesting that these may represent  seromas. Soft tissue masses cannot be excluded. As previously discussed, these can be re-evaluated with a follow-up CT with contrast.   Electronically Signed   By: Claudie Revering M.D.   On: 08/19/2018 00:22    Hip-L DG 2-3 views:  Results for orders placed during the hospital encounter of 08/18/18  DG Hip Unilat W or Wo Pelvis 2-3 Views Left   Narrative CLINICAL DATA:  LEFT hip pain after fall.  EXAM: DG HIP (WITH OR WITHOUT PELVIS) 2-3V LEFT  COMPARISON:  None.  FINDINGS: There is no evidence of hip fracture or dislocation. There is no evidence of arthropathy or other focal bone abnormality. Osteopenia. RIGHT nephroureteral stent with proximal retaining loop projecting central in pelvis.  IMPRESSION: Negative.  If there is high clinical suspicion for occult hip fracture or the patient refuses to bear weight, consider further evaluation with MRI. Although CT is expeditious, evidence is lacking regarding accuracy of CT over plain film radiography.   Electronically Signed   By:  Elon Alas M.D.   On: 08/18/2018 19:56    Knee Imaging: Knee-R DG 1-2 views:  Results for orders placed during the hospital encounter of 01/08/07  DG Knee 2 Views Right   Narrative Clinical Data: Right knee pain and swelling.  RIGHT KNEE - 2 VIEW:  Findings: No definite joint effusion or fracture.  IMPRESSION:  No acute findings.  Provider: Margarita Mail   Elbow Imaging: Elbow-L DG Complete:  Results for orders placed during the hospital encounter of 07/03/15  DG Elbow Complete Left   Narrative CLINICAL DATA:  Elbow pain, no known injury, diabetes, history of uterine cancer  EXAM: LEFT ELBOW - COMPLETE 3+ VIEW  COMPARISON:  None.  FINDINGS: Four views of left elbow submitted. No acute fracture or subluxation. No posterior fat pad sign. No pathologic calcifications are noted.  IMPRESSION: Negative.   Electronically Signed   By: Lahoma Crocker M.D.   On: 07/04/2015 14:59    Complexity Note: Imaging results reviewed. Results shared with [name], using [employer].                        Assessment  The primary encounter diagnosis was Chronic pain syndrome. Diagnoses of Chronic low back pain (Primary area of Pain) (Bilateral) (L>R) w/ sciatica (Bilateral), Chronic lower extremity pain (Secondary Area of Pain) (Bilateral) (L>R), DDD (degenerative disc disease), lumbar, Lumbar intervertebral disc displacement, Chronic musculoskeletal pain, Myofascial pain syndrome, Fibromyalgia syndrome, Chronic intermittent abdominal pain, Chronic neck pain, Chronic generalized pain, Vitamin D deficiency, Hypokalemia, Hypomagnesemia, and Hyponatremia were also pertinent to this visit.  Plan of Care  I have changed [name]'s cyclobenzaprine. I am also having her start on ergocalciferol, Vitamin D3, Magnesium, calcium carbonate, and Vitamin B-12. Additionally, I am having her maintain her Contour Blood Glucose System, Lancet Device, glucose blood, apixaban, atorvastatin,  acetaminophen, albuterol, metoprolol tartrate, and Insulin Detemir. Pharmacotherapy (Medications Ordered): Meds ordered this encounter  Medications  . cyclobenzaprine (FLEXERIL) 10 MG tablet    Sig: Take 1 tablet (10 mg total) by mouth 3 (three) times daily as needed for up to 30 days for muscle spasms. Must last 30 days.    Dispense:  90 tablet    Refill:  0    Do not place medication on "Automatic Refill". Fill one day early if pharmacy is closed on scheduled refill date.  . ergocalciferol (VITAMIN D2) 1.25 MG (50000 UT) capsule    Sig: Take 1 capsule (  50,000 Units total) by mouth 2 (two) times a week. X 6 weeks.    Dispense:  12 capsule    Refill:  1    Do not add this medication to the electronic "Automatic Refill" notification system. Patient may have prescription filled one day early if pharmacy is closed on scheduled refill date.  . Cholecalciferol (VITAMIN D3) 125 MCG (5000 UT) CAPS    Sig: Take 1 capsule (5,000 Units total) by mouth daily with breakfast. Take along with calcium and magnesium.    Dispense:  180 capsule    Refill:  0    May substitute with similar over-the-counter product.  . Magnesium 500 MG CAPS    Sig: Take 1 capsule (500 mg total) by mouth 2 (two) times daily at 8 am and 10 pm.    Dispense:  60 capsule    Refill:  5    May substitute with similar over-the-counter product.  . calcium carbonate (CALCIUM 600) 600 MG TABS tablet    Sig: Take 1 tablet (600 mg total) by mouth 2 (two) times daily with a meal.    Dispense:  60 tablet    Refill:  5    May substitute with similar over-the-counter product.  . Cyanocobalamin (VITAMIN B-12) 5000 MCG SUBL    Sig: Place 1 tablet (5,000 mcg total) under the tongue daily.    Dispense:  30 tablet    Refill:  5    Do not place medication on "Automatic Refill". Fill one day early if pharmacy is closed on scheduled refill date.   Procedure Orders    No procedure(s) ordered today   Lab Orders  No laboratory test(s)  ordered today   Imaging Orders  No imaging studies ordered today   Referral Orders  No referral(s) requested today    Orders:  No orders of the defined types were placed in this encounter.  Pharmacological management options:  Opioid Analgesics: We'll take over management today. See above orders Membrane stabilizer: Options discussed, including a trial. Muscle relaxant: We have discussed the possibility of a trial NSAID: Trial discussed. Other analgesic(s): To be determined at a later time   Interventional management options: Planned, scheduled, and/or pending:    Currently the patient has hyponatremia, hypokalemia, hypocalcemia, low phosphate levels, anemia, and a vitamin D deficiency.  The patient was instructed to start taking sugar-free Gatorade (G0) 8 ounces 3 times a day with each meal.  In addition, I have sent to her pharmacy replacement therapy for her vitamin D deficiency, calcium supplement, and magnesium.  The idea is to correct this imbalance first and then we will look into what else we can do for her.  However, her pain is generalized and that means that a lot of this comes from systemic problems as opposed to more localized ones.  Unfortunately at this time we cannot fully examine her because of the COVID-19 restrictions.  Once these restrictions are lifted, I will do a full physical exam the patient so asked to have a better idea what is going on.  At that time will also need to see if we have a been able to correct the other metabolic problems.   Considering:   Diagnostic bilateral lumbar facet nerve block  Possible bilateral lumbar facet RFA  Diagnostic bilateral cervical facet nerve block  Possible bilateral cervical facet RFA    PRN Procedures:   None at this time   Total duration of non-face-to-face encounter: 20 minutes.  Follow-up plan:  Return in about 1 month (around 01/16/2019) for Med-Mgmt, w/ Dr. Dossie Arbour, (Virtual).    Future Appointments  Date Time  Provider [place]  12/26/2018  2:00 PM [name], NP [facility]  01/12/2019  9:20 AM [name], NP [facility]    Primary Care Physician: [name], NP Location: Telephone Virtual Visit Note by: Gaspar Cola, MD Date: 12/17/2018; Time: 3:56 PM  Disclaimer:  * Given the special circumstances of the COVID-19 pandemic, the federal government has announced that the Office for Civil Rights (OCR) will exercise its enforcement discretion and will not impose penalties on physicians using telehealth in the event of noncompliance with regulatory requirements under the [place] and Accountability Act (HIPAA) in connection with the good faith provision of telehealth during the OIBBC-48 national public health emergency. ([place])

## 2018-12-26 ENCOUNTER — Telehealth: Payer: Self-pay | Admitting: *Deleted

## 2018-12-26 ENCOUNTER — Ambulatory Visit (INDEPENDENT_AMBULATORY_CARE_PROVIDER_SITE_OTHER): Payer: Medicare Other | Admitting: Primary Care

## 2018-12-26 DIAGNOSIS — E1165 Type 2 diabetes mellitus with hyperglycemia: Secondary | ICD-10-CM | POA: Diagnosis not present

## 2018-12-26 MED ORDER — INSULIN DETEMIR 100 UNIT/ML FLEXPEN: PEN_INJECTOR | SUBCUTANEOUS | 5 refills | Status: DC

## 2018-12-26 NOTE — Progress Notes (Signed)
Subjective:    Patient ID: [name], female    DOB: 07-Oct-1955, 63 y.o.   MRN: [id number]  HPI     [name] - 63 y.o. female  MRN [id number]  Date of Birth: 05/23/1956  PCP: [name], NP  This service was provided via telemedicine. Phone Visit performed on 12/26/2018    Rationale for phone visit along with limitations reviewed. Patient consented to telephone encounter.    Location of patient: Home Location of provider: Office at [employer] @ [facility] Name of referring provider: N/A   Names of persons and role in encounter: Provider: [name], NP  Patient: [name]  Other: N/A   Time on call: 7 min 20 seconds   Subjective: CC: Follow up for diabetes HPI:  [name] is a 63 year old female with a complex medical history who presents today for follow up of diabetes.  She was last evaluated on 12/12/18 with hyperglycemia despite switching to Lantus. She did well on Levemir historically but her insurance stopped covering. During her last visit she endorsed that her insurance company would now pay for Levemir so she was switched back to Levemir 20 units BID. We discussed that if she could not afford Levemir then to increase Lantus to 40 untis HS.   Since her last visit she got the Levemir and is injecting 30 units at bedtime.  She is checking her glucose four times daily:  AM fasting: 562, 510, 472, 421, 410, 374 Before lunch: 400-480's Before dinner: mid 300's, 493, 442 Before bedtime: 415, 490, 399, 412, 361  Diet currently consists of:  Breakfast: Egg, oatmeal Lunch: TV dinner, sandwich Dinner: TV dinner Snacks: Cheese crackers Desserts: None Beverages: Flavored water, Ensure, Gatorade Zero   Objective/Observations:   No physical exam or vital signs collected unless specifically identified below.   There were no vitals taken for this visit.   Respiratory status: speaks in complete sentences without evident shortness of  breath.   Assessment/Plan:  No problem-specific Assessment & Plan notes found for this encounter.   I discussed the assessment and treatment plan with the patient. The patient was provided an opportunity to ask questions and all were answered. The patient agreed with the plan and demonstrated an understanding of the instructions.  Lab Orders  No laboratory test(s) ordered today    No orders of the defined types were placed in this encounter.   The patient was advised to call back or seek an in-person evaluation if the symptoms worsen or if the condition fails to improve as anticipated.  [name], NP    Review of Systems  Eyes: Negative for visual disturbance.  Respiratory: Negative for shortness of breath.   Cardiovascular: Negative for chest pain.  Neurological: Negative for dizziness and headaches.        Past Medical History:  Diagnosis Date  . Acute encephalopathy 05/22/2018  . Allergy   . Anxiety   . Ascites   . C. difficile colitis 07/10/2015  . Cancer (HCC)    HX OF CANCER OF UTERUS   . Cirrhosis of liver not due to alcohol ([place]) 2016  . Degenerative disk disease   . Diverticulitis   . Gastroparesis   . GERD (gastroesophageal reflux disease)   . History of hiatal hernia   . Hypertension   . Hypothyroid   . Hypothyroidism due to amiodarone   . Ileus ([place]) 08/01/2015  . Intussusception intestine ([place]) 05/2015  .  Orthostatic hypotension   . PAF (paroxysmal atrial fibrillation) ([place]) 03/2015   a. new onset 03/2015 in setting of intractable N/V; b. on Eliquis 5 mg bid; c. CHADSVASc 4 (DM, TIA x 2, female)  . Pancreatitis   . Pneumonia 11/14/2015  . Right ureteral stone 07/14/2016  . Sick sinus syndrome ([place])   . Stomach ulcer   . Stroke [facility])    with minimal left sided weakness  . Syncope 01/2015  . Syncope due to orthostatic hypotension 05/18/2015  . Tachyarrhythmia 01/10/2016  . TIA (transient ischemic attack) 02/2015  . Type 1 diabetes ([place])    on  levemir  . UTERINE CANCER, HX OF 03/27/2007   Qualifier: Diagnosis of  By: Maxie Better FNP, Rosalita Levan   . UTI (urinary tract infection) 05/22/2018     Social History   Socioeconomic History  . Marital status: Married    Spouse name: Not on file  . Number of children: Not on file  . Years of education: Not on file  . Highest education level: Not on file  Occupational History  . Occupation: Disabled 2nd back problems  Social Needs  . Financial resource strain: Not on file  . Food insecurity:    Worry: Not on file    Inability: Not on file  . Transportation needs:    Medical: Not on file    Non-medical: Not on file  Tobacco Use  . Smoking status: Former Smoker    Types: Cigarettes  . Smokeless tobacco: Never Used  . Tobacco comment: 25 years ago and only smoked occasionally  Substance and Sexual Activity  . Alcohol use: No  . Drug use: No  . Sexual activity: Not Currently  Lifestyle  . Physical activity:    Days per week: Not on file    Minutes per session: Not on file  . Stress: Not on file  Relationships  . Social connections:    Talks on phone: Not on file    Gets together: Not on file    Attends religious service: Not on file    Active member of club or organization: Not on file    Attends meetings of clubs or organizations: Not on file    Relationship status: Not on file  . Intimate partner violence:    Fear of current or ex partner: Not on file    Emotionally abused: Not on file    Physically abused: Not on file    Forced sexual activity: Not on file  Other Topics Concern  . Not on file  Social History Narrative   Lives in [place], [place] with her husband and 2 sons.    Past Surgical History:  Procedure Laterality Date  . ABDOMINAL HYSTERECTOMY    . CARDIAC CATHETERIZATION N/A 01/12/2016   Procedure: Left Heart Cath and Coronary Angiography;  Surgeon: Wellington Hampshire, MD;  Location: [facility];  Service: Cardiovascular;  Laterality: N/A;  .  CHOLECYSTECTOMY    . CYSTOSCOPY/URETEROSCOPY/HOLMIUM LASER Right 07/14/2016   Procedure: CYSTOSCOPY/URETEROSCOPY/HOLMIUM LASER;  Surgeon: Alexis Frock, MD;  Location: [facility];  Service: Urology;  Laterality: Right;  . ESOPHAGOGASTRODUODENOSCOPY N/A 04/04/2015   Procedure: ESOPHAGOGASTRODUODENOSCOPY (EGD);  Surgeon: Hulen Luster, MD;  Location: [facility] ENDOSCOPY;  Service: Endoscopy;  Laterality: N/A;  . ESOPHAGOGASTRODUODENOSCOPY N/A 12/28/2017   Procedure: ESOPHAGOGASTRODUODENOSCOPY (EGD);  Surgeon: Lin Landsman, MD;  Location: [facility] ENDOSCOPY;  Service: Gastroenterology;  Laterality: N/A;  . ESOPHAGOGASTRODUODENOSCOPY (EGD) WITH PROPOFOL N/A 01/18/2016   Procedure: ESOPHAGOGASTRODUODENOSCOPY (EGD) WITH PROPOFOL;  Surgeon: Lucilla Lame, MD;  Location: [facility] ENDOSCOPY;  Service: Endoscopy;  Laterality: N/A;  . FLEXIBLE SIGMOIDOSCOPY N/A 01/18/2016   Procedure: FLEXIBLE SIGMOIDOSCOPY;  Surgeon: Lucilla Lame, MD;  Location: [facility] ENDOSCOPY;  Service: Endoscopy;  Laterality: N/A;  . HERNIA REPAIR      Family History  Problem Relation Age of Onset  . Hypertension Mother   . CAD Sister   . Heart attack Sister        Deceased 11-07-14  . CAD Brother     Allergies  Allergen Reactions  . Hydrocodone Other (See Comments)    Pt states that this medication caused cirrhosis of the liver.    . Aspirin   . Erythromycin Other (See Comments)    Reaction:  Fever   . Prednisone Other (See Comments)    Reaction:  Unknown   . Rosiglitazone Maleate Swelling  . Codeine Sulfate Rash  . Tetanus-Diphtheria Toxoids Td Rash and Other (See Comments)    Reaction:  Fever     Current Outpatient Medications on File Prior to Visit  Medication Sig Dispense Refill  . acetaminophen (TYLENOL) 325 MG tablet Take 650 mg by mouth every 6 (six) hours as needed.    Marland Kitchen albuterol (PROVENTIL HFA;VENTOLIN HFA) 108 (90 Base) MCG/ACT inhaler Inhale 2 puffs into the lungs every 6 (six) hours as needed for wheezing or shortness of  breath. 1 Inhaler 2  . apixaban (ELIQUIS) 5 MG TABS tablet Take 5 mg by mouth 2 (two) times daily.    Marland Kitchen atorvastatin (LIPITOR) 40 MG tablet Take 40 mg by mouth daily.    . Blood Glucose Monitoring Suppl (CONTOUR BLOOD GLUCOSE SYSTEM) w/Device KIT Use to test blood sugar 3 times daily. Dx is E11.65 1 each 0  . calcium carbonate (CALCIUM 600) 600 MG TABS tablet Take 1 tablet (600 mg total) by mouth 2 (two) times daily with a meal. 60 tablet 5  . Cholecalciferol (VITAMIN D3) 125 MCG (5000 UT) CAPS Take 1 capsule (5,000 Units total) by mouth daily with breakfast. Take along with calcium and magnesium. 180 capsule 0  . Cyanocobalamin (VITAMIN B-12) 5000 MCG SUBL Place 1 tablet (5,000 mcg total) under the tongue daily. 30 tablet 5  . [START ON 01/01/2019] cyclobenzaprine (FLEXERIL) 10 MG tablet Take 1 tablet (10 mg total) by mouth 3 (three) times daily as needed for up to 30 days for muscle spasms. Must last 30 days. 90 tablet 0  . ergocalciferol (VITAMIN D2) 1.25 MG (50000 UT) capsule Take 1 capsule (50,000 Units total) by mouth 2 (two) times a week. X 6 weeks. 12 capsule 1  . glucose blood test strip Contour Test Strips. Use as instructed to test blood sugar 3 times daily. 100 each 2  . Insulin Detemir (LEVEMIR) 100 UNIT/ML Pen Inject 20 Units into the skin 2 (two) times daily. 15 mL 5  . Lancet Device MISC Use as instructed to test blood sugar 3 times daily. Dx is E11.65 100 each 2  . Magnesium 500 MG CAPS Take 1 capsule (500 mg total) by mouth 2 (two) times daily at 8 am and 10 pm. 60 capsule 5  . metoprolol tartrate (LOPRESSOR) 25 MG tablet Take 1 tablet (25 mg total) by mouth 2 (two) times daily. For heart rate. 180 tablet 3   No current facility-administered medications on file prior to visit.     There were no vitals taken for this visit.   Objective:   Physical Exam  Constitutional: She is oriented to  person, place, and time. She appears well-nourished.  Respiratory: Effort normal.   Neurological: She is alert and oriented to person, place, and time.  Psychiatric: She has a normal mood and affect.           Assessment & Plan:

## 2018-12-26 NOTE — Patient Instructions (Signed)
Inject 20 units of Levemir in the morning and 30 units of Levemir in the evening.  Continue to monitor your blood sugars and we'll call you back in 2 weeks.  It was a pleasure to see you today!

## 2018-12-26 NOTE — Assessment & Plan Note (Signed)
Glucose readings still uncontrolled, but she was able to switch back to Levemir. She was not doing the prescribed regimen of 20 units BID. Given such high readings we will have her start with 20 units in the morning and continue with 30 units in the evening. We will call her back in 2 weeks for follow up.   Commended her on regularly checking her glucose, encouraged to work on a healthy diet.

## 2018-12-26 NOTE — Telephone Encounter (Signed)
Rx completed and placed in Chan's inbox.

## 2018-12-26 NOTE — Telephone Encounter (Signed)
Patient called stating that she talked with a lady with medicaid and was advised that she needs a written RX for a blood pressure monitoring kit from her provider. Patient requested a call back when the script is ready. 

## 2018-12-26 NOTE — Telephone Encounter (Signed)
Spoken and notified patient's husband of [name] comments. Patient's husband verbalized understanding and will let patient know.

## 2018-12-31 NOTE — Telephone Encounter (Signed)
Inform the patient that Rx for BP cuff is ready for pick. Patient stated due to transportation, she will get the Rx on her appointment on 01/12/2019

## 2019-01-12 ENCOUNTER — Ambulatory Visit: Payer: Medicare Other | Admitting: Primary Care

## 2019-01-12 ENCOUNTER — Telehealth: Payer: Self-pay | Admitting: *Deleted

## 2019-01-12 NOTE — Telephone Encounter (Signed)
Noted. Please remind patient that she needs to be eating at least two full meals daily with small snacks in between when injecting insulin. She also MUST check her blood sugars prior to injecting insulin and not inject for readings at or below 110.   She's due for repeat A1C, let's have her scheduled for a virtual/phone visit with a lab appointment prior. Will you please schedule?

## 2019-01-12 NOTE — Telephone Encounter (Signed)
Noted. Try to call patient this morning but patient did not feel well to answer the phone. Patient does have an appointment on 01/13/2019.

## 2019-01-12 NOTE — Telephone Encounter (Signed)
Patent called back stating that her blood sugar is 115 and she is feeling some better. Advised patient not to take her insulin until she hears back from [name] NP or her assistant and she verbalized understanding.

## 2019-01-12 NOTE — Telephone Encounter (Addendum)
Patient called stating that she wants to cancel her appointment for tomorrow with [name] NP and reschedule for tomorrow. Patient stated that she was sweating all night and this morning her blood sugar is 58. Patient stated that she has not taken her insulin this morning and she was advised not to take it. Patient stated that her blood sugar last night was 270 around 10:30 pm and she took her nightly insulin.  Patient stated that she is feeling dizzy and feels like she is going pass out and someone is having to help her when she gets up and walks. Patient stated that she has a coke and will drink it now. Advised patient if she gets any worse she may need to go to the ER and she verbalized understanding. Advised patient to check her blood sugar about 30 minutes after drinking the coke and call back and let us know what her sugar is at that time. After talking with [name] appointment was rescheduled for tomorrow Tuesday at 11:00. Patient to call back with updated blood sugar reading.

## 2019-01-12 NOTE — Progress Notes (Signed)
Pain Management Virtual Encounter Note - Virtual Visit via Telephone Telehealth (real-time audio visits between healthcare provider and patient).  Patient's Phone No. & Preferred Pharmacy:  [phone] (home); There is no such number on file (mobile).; (Preferred) [phone] No e-mail address on record  [place], [place] [address] Phone: [phone] Fax: [phone]   Pre-screening note:  Our staff contacted Ms. Rosner and offered her an "in person", "face-to-face" appointment versus a telephone encounter. She indicated preferring the telephone encounter, at this time.  Reason for Virtual Visit: COVID-19*  Social distancing based on CDC and AMA recommendations.   I contacted [name] on 01/14/2019 at 11:43 AM via telephone and clearly identified myself as Gaspar Cola, MD. I verified that I was speaking with the correct person using two identifiers (Name and date of birth: 1955-11-23).  Advanced Informed Consent I sought verbal advanced consent from [name] for virtual visit interactions. I informed Ms. Goren of possible security and privacy concerns, risks, and limitations associated with providing "not-in-person" medical evaluation and management services. I also informed Ms. Cimmino of the availability of "in-person" appointments. Finally, I informed her that there would be a charge for the virtual visit and that she could be  personally, fully or partially, financially responsible for it. Ms. Muldrow expressed understanding and agreed to proceed.   Historic Elements   Ms. AMALI UHLS is a 63 y.o. year old, female patient evaluated today after her last encounter by our practice on 12/17/2018. Ms. Keeny  has a past medical history of Acute encephalopathy (05/22/2018), Allergy, Anxiety, Ascites, C. difficile colitis (07/10/2015), Cancer ([place]), Cirrhosis of liver not due to alcohol ([place]) (2016), Degenerative  disk disease, Diverticulitis, Gastroparesis, GERD (gastroesophageal reflux disease), History of hiatal hernia, Hypertension, Hypothyroid, Hypothyroidism due to amiodarone, Ileus ([place]) (08/01/2015), Intussusception intestine ([place]) (05/2015), Orthostatic hypotension, PAF (paroxysmal atrial fibrillation) ([place]) (03/2015), Pancreatitis, Pneumonia (11/14/2015), Right ureteral stone (07/14/2016), Sick sinus syndrome ([place]), Stomach ulcer, Stroke ([place]), Syncope (01/2015), Syncope due to orthostatic hypotension (05/18/2015), Tachyarrhythmia (01/10/2016), TIA (transient ischemic attack) (02/2015), Type 1 diabetes ([place]), UTERINE CANCER, HX OF (03/27/2007), and UTI (urinary tract infection) (05/22/2018). She also  has a past surgical history that includes Hernia repair; Abdominal hysterectomy; Cholecystectomy; Esophagogastroduodenoscopy (N/A, 04/04/2015); Cardiac catheterization (N/A, 01/12/2016); Esophagogastroduodenoscopy (egd) with propofol (N/A, 01/18/2016); Flexible sigmoidoscopy (N/A, 01/18/2016); Cystoscopy/ureteroscopy/holmium laser (Right, 07/14/2016); and Esophagogastroduodenoscopy (N/A, 12/28/2017). Ms. Govea has a current medication list which includes the following prescription(s): albuterol, apixaban, atorvastatin, contour blood glucose system, calcium carbonate, vitamin d3, vitamin b-12, cyclobenzaprine, ergocalciferol, glucose blood, insulin detemir, lancet device, magnesium, metoprolol tartrate, naproxen sodium, and tramadol. She  reports that she has quit smoking. Her smoking use included cigarettes. She has never used smokeless tobacco. She reports that she does not drink alcohol or use drugs. Ms. Landgren is allergic to aspirin; codeine sulfate; erythromycin; prednisone; rosiglitazone maleate; and tetanus-diphtheria toxoids td.   HPI  I last communicated with her on 12/17/2018. Today, she is being contacted for medication management.  According to the patient, she continues to have significant amount of pain.  She is not a good  candidate for oral steroids secondary to her diabetes.  Despite the fact that I recommend that for her to see her PCP as soon as possible she actually saw her just yesterday.  The patient has not had a chance to fully recovered from her electrolyte imbalances.  Therefore, she still feels weak.  I  have recommended for her to continue the medications that I have prescribed and today I will be adding some tramadol 50 mg 1 tablet p.o. every 6 hours as needed for pain.  I have prescribed some Flexeril for her but she did not pick it up until yesterday.  Pharmacotherapy Assessment  Analgesic: Tramadol 50 mg 1 tablet p.o. 4 times daily (200 mg/day of tramadol) but (started on 01/14/2019) MME/day: 20 mg/day.   Monitoring: Pharmacotherapy: No side-effects or adverse reactions reported. [place] PMP: PDMP reviewed during this encounter.          Compliance: No problems identified or detected.  Plan: Refer to "POC".  Review of recent tests  DG Chest 2 View CLINICAL DATA:  Cough.  EXAM: CHEST - 2 VIEW  COMPARISON:  Chest x-ray dated August 18, 2018.  FINDINGS: The heart size and mediastinal contours are within normal limits. Normal pulmonary vascularity. Atherosclerotic calcification of the aortic arch. Streaky linear opacities in both lower lobes. No focal consolidation, pleural effusion, or pneumothorax. No acute osseous abnormality.  IMPRESSION: 1. Bilateral lower lobe streaky linear opacities, favoring atelectasis.  Electronically Signed   By: Titus Dubin M.D.   On: 11/26/2018 16:53   Admission on 11/26/2018, Discharged on 11/27/2018  Component Date Value Ref Range Status  . Sodium 11/26/2018 130* 135 - 145 mmol/L Final  . Potassium 11/26/2018 3.1* 3.5 - 5.1 mmol/L Final  . Chloride 11/26/2018 98  98 - 111 mmol/L Final  . CO2 11/26/2018 20* 22 - 32 mmol/L Final  . Glucose, Bld 11/26/2018 317* 70 - 99 mg/dL Final  . BUN 11/26/2018 18  8 - 23 mg/dL Final  . Creatinine, Ser 11/26/2018  1.14* 0.44 - 1.00 mg/dL Final  . Calcium 11/26/2018 8.4* 8.9 - 10.3 mg/dL Final  . GFR calc non Af Amer 11/26/2018 51* >60 mL/min Final  . GFR calc Af Amer 11/26/2018 59* >60 mL/min Final  . Anion gap 11/26/2018 12  5 - 15 Final   Performed at [facility], [address] [place], [place] [address]  . WBC 11/26/2018 8.5  4.0 - 10.5 K/uL Final  . RBC 11/26/2018 4.00  3.87 - 5.11 MIL/uL Final  . Hemoglobin 11/26/2018 10.4* 12.0 - 15.0 g/dL Final  . HCT 11/26/2018 33.4* 36.0 - 46.0 % Final  . MCV 11/26/2018 83.5  80.0 - 100.0 fL Final  . MCH 11/26/2018 26.0  26.0 - 34.0 pg Final  . MCHC 11/26/2018 31.1  30.0 - 36.0 g/dL Final  . RDW 11/26/2018 14.0  11.5 - 15.5 % Final  . Platelets 11/26/2018 245  150 - 400 K/uL Final  . nRBC 11/26/2018 0.0  0.0 - 0.2 % Final   Performed at [facility], [address] [place], [place] [address]  . Troponin I 11/26/2018 <0.03  <0.03 ng/mL Final   Performed at [facility], [address] [place], [place] [address]  . Prothrombin Time 11/26/2018 16.2* 11.4 - 15.2 seconds Final  . INR 11/26/2018 1.3* 0.8 - 1.2 Final   Comment: (NOTE) INR goal varies based on device and disease states. Performed at [facility], [address] [place], [place] [address]   . Lactic Acid, Venous 11/26/2018 2.0* 0.5 - 1.9 mmol/L Final   Comment: CRITICAL RESULT CALLED TO, READ BACK BY AND VERIFIED WITH GRACIE LINEMAN 11/26/18 1638 KLW Performed at [facility], [address], [place], [place] [address]   . Lactic Acid, Venous 11/26/2018 1.6  0.5 - 1.9 mmol/L  Final   Performed at [facility], [address] [place], [place] [address]  . Color, Urine 11/26/2018 YELLOW* YELLOW Final  . APPearance 11/26/2018 TURBID* CLEAR Final  . Specific Gravity, Urine 11/26/2018 1.021  1.005 - 1.030 Final  . pH 11/26/2018 5.0  5.0 - 8.0 Final  . Glucose, UA 11/26/2018 >=500* NEGATIVE mg/dL Final  . Hgb urine dipstick  11/26/2018 LARGE* NEGATIVE Final  . Bilirubin Urine 11/26/2018 NEGATIVE  NEGATIVE Final  . Ketones, ur 11/26/2018 5* NEGATIVE mg/dL Final  . Protein, ur 11/26/2018 100* NEGATIVE mg/dL Final  . Nitrite 11/26/2018 NEGATIVE  NEGATIVE Final  . Leukocytes,Ua 11/26/2018 MODERATE* NEGATIVE Final  . RBC / HPF 11/26/2018 >50* 0 - 5 RBC/hpf Final  . WBC, UA 11/26/2018 >50* 0 - 5 WBC/hpf Final  . Bacteria, UA 11/26/2018 NONE SEEN  NONE SEEN Final  . Squamous Epithelial / LPF 11/26/2018 11-20  0 - 5 Final  . WBC Clumps 11/26/2018 PRESENT   Final  . Budding Yeast 11/26/2018 PRESENT   Final   Performed at [facility], [address], [place], [place] [address]  . Specimen Description 11/26/2018    Final                   Value:URINE, RANDOM Performed at [facility], [address] [place], [place] [address]   . Special Requests 11/26/2018    Final                   Value:NONE Performed at [facility], [address] [place], [place] [address]   . Culture 11/26/2018 *  Final                   Value:50,000 COLONIES/mL GROUP B STREP(S.AGALACTIAE)ISOLATED TESTING AGAINST S. AGALACTIAE NOT ROUTINELY PERFORMED DUE TO PREDICTABILITY OF AMP/PEN/VAN SUSCEPTIBILITY. Performed at [facility], [place] [address], [place], [place] [address]   . Report Status 11/26/2018 11/27/2018 FINAL   Final  . Sodium 11/27/2018 132* 135 - 145 mmol/L Final  . Potassium 11/27/2018 3.2* 3.5 - 5.1 mmol/L Final  . Chloride 11/27/2018 104  98 - 111 mmol/L Final  . CO2 11/27/2018 20* 22 - 32 mmol/L Final  . Glucose, Bld 11/27/2018 210* 70 - 99 mg/dL Final  . BUN 11/27/2018 18  8 - 23 mg/dL Final  . Creatinine, Ser 11/27/2018 0.99  0.44 - 1.00 mg/dL Final  . Calcium 11/27/2018 7.6* 8.9 - 10.3 mg/dL Final  . GFR calc non Af Amer 11/27/2018 >60  >60 mL/min Final  . GFR calc Af Amer 11/27/2018 >60  >60 mL/min Final  . Anion gap 11/27/2018 8  5 - 15 Final   Performed at [facility],  [address], [place], [place] [address]  . WBC 11/27/2018 7.9  4.0 - 10.5 K/uL Final  . RBC 11/27/2018 3.63* 3.87 - 5.11 MIL/uL Final  . Hemoglobin 11/27/2018 9.6* 12.0 - 15.0 g/dL Final  . HCT 11/27/2018 30.8* 36.0 - 46.0 % Final  . MCV 11/27/2018 84.8  80.0 - 100.0 fL Final  . MCH 11/27/2018 26.4  26.0 - 34.0 pg Final  . MCHC 11/27/2018 31.2  30.0 - 36.0 g/dL Final  . RDW 11/27/2018 14.0  11.5 - 15.5 % Final  . Platelets 11/27/2018 211  150 - 400 K/uL Final  . nRBC 11/27/2018 0.0  0.0 - 0.2 % Final   Performed at [facility], [address] [place], [place] [address]  .  Glucose-Capillary 11/26/2018 199* 70 - 99 mg/dL Final  . Glucose-Capillary 11/27/2018 173* 70 - 99 mg/dL Final  . Comment 1 11/27/2018 Notify RN   Final  . Glucose-Capillary 11/27/2018 187* 70 - 99 mg/dL Final   Assessment  The primary encounter diagnosis was Chronic low back pain (Primary area of Pain) (Bilateral) (L>R) w/ sciatica (Bilateral). Diagnoses of Chronic lower extremity pain (Secondary Area of Pain) (Bilateral) (L>R), DDD (degenerative disc disease), lumbar, Chronic generalized pain, and Chronic pain syndrome were also pertinent to this visit.  Plan of Care  I have discontinued [name] A. Stuhr's acetaminophen. I am also having her start on traMADol. Additionally, I am having her maintain her Contour Blood Glucose System, Lancet Device, glucose blood, apixaban, atorvastatin, albuterol, metoprolol tartrate, cyclobenzaprine, ergocalciferol, Vitamin D3, Magnesium, calcium carbonate, Vitamin B-12, Insulin Detemir, and naproxen sodium.  Pharmacotherapy (Medications Ordered): Meds ordered this encounter  Medications  . traMADol (ULTRAM) 50 MG tablet    Sig: Take 1 tablet (50 mg total) by mouth every 6 (six) hours as needed for up to 30 days for severe pain. Must last 30 days.    Dispense:  120 tablet    Refill:  0    [place] STOP ACT - Not applicable to Chronic Pain Syndrome (G89.4) diagnosis. Fill one  day early if pharmacy is closed on scheduled refill date. Do not fill until: 01/14/19. To last until: 02/13/19.   Orders:  No orders of the defined types were placed in this encounter.  Follow-up plan:   Return in about 1 month (around 02/14/2019) for Med-Mgmt, (Virtual Visit).  Until the COVID-19 restrictions are lifted, we have been concentrating on optimizing her pharmacological therapy.  I have been hesitant to get the patient started on any narcotics even though she has taken them in the past with no apparent problems.   I discussed the assessment and treatment plan with the patient. The patient was provided an opportunity to ask questions and all were answered. The patient agreed with the plan and demonstrated an understanding of the instructions.  Patient advised to call back or seek an in-person evaluation if the symptoms or condition worsens.  Total duration of non-face-to-face encounter: 20 minutes.  Note by: Gaspar Cola, MD Date: 01/14/2019; Time: 12:17 PM  Disclaimer:  * Given the special circumstances of the COVID-19 pandemic, the federal government has announced that the Office for Civil Rights (OCR) will exercise its enforcement discretion and will not impose penalties on physicians using telehealth in the event of noncompliance with regulatory requirements under the [place] and Accountability Act (HIPAA) in connection with the good faith provision of telehealth during the GXQJJ-94 national public health emergency. ([place])

## 2019-01-13 ENCOUNTER — Other Ambulatory Visit: Payer: Self-pay

## 2019-01-13 ENCOUNTER — Ambulatory Visit (INDEPENDENT_AMBULATORY_CARE_PROVIDER_SITE_OTHER): Payer: Medicare Other | Admitting: Primary Care

## 2019-01-13 ENCOUNTER — Encounter: Payer: Self-pay | Admitting: Primary Care

## 2019-01-13 ENCOUNTER — Other Ambulatory Visit: Payer: Self-pay | Admitting: Pain Medicine

## 2019-01-13 VITALS — BP 130/84 | HR 65 | Temp 98.0°F | Ht 63.0 in | Wt 122.2 lb

## 2019-01-13 DIAGNOSIS — E1165 Type 2 diabetes mellitus with hyperglycemia: Secondary | ICD-10-CM | POA: Diagnosis not present

## 2019-01-13 DIAGNOSIS — M7918 Myalgia, other site: Secondary | ICD-10-CM

## 2019-01-13 DIAGNOSIS — M797 Fibromyalgia: Secondary | ICD-10-CM

## 2019-01-13 DIAGNOSIS — G8929 Other chronic pain: Secondary | ICD-10-CM

## 2019-01-13 MED ORDER — INSULIN DETEMIR 100 UNIT/ML FLEXPEN: PEN_INJECTOR | SUBCUTANEOUS | 5 refills | Status: DC

## 2019-01-13 NOTE — Progress Notes (Signed)
Subjective:    Patient ID: [name], female    DOB: 1955-12-26, 63 y.o.   MRN: [id number]  HPI  [name] is a 63 year old female who presents today for follow up of type 2 diabetes.  Current medications include: Levemir 20 units in the AM and 30 units HS.  She is checking her blood glucose 3 times daily and is getting readings of:  AM fasting: 200-400 Before Lunch: 300-400's Before Dinner: 300-400's Bedtime: 300-500's   Last A1C: 9.4 in early March 2020 Last Eye Exam: Last Foot Exam: Due in January 2021 Pneumonia Vaccination: Completed in 2016 ACE/ARB: Urine micro negative in 2020 Statin: Lipitor  She called into our office yesterday with reports of hypoglycemia when waking, reading of 58. She had sweats and felt jittery and fatigued. She ate an egg and drank Coke then checked again 30 minutes later with a reading of 115. This morning her fasting morning glucose was 104, last night her glucose reading was 313. She endorsed eating dinner the night prior to her morning prior to her reading of 58.  She's never had a reading that low before.   Diet currently consists of:  Breakfast: Cereal, egg Lunch: Frozen dinner Dinner: Frozen dinner Snacks: Chips Desserts: None Beverages: Ensure, Zero Gatorade, Flavored water     Review of Systems  Constitutional: Negative for fatigue.  Eyes: Negative for visual disturbance.  Respiratory: Negative for shortness of breath.   Cardiovascular: Negative for chest pain.  Neurological: Negative for dizziness.       Past Medical History:  Diagnosis Date  . Acute encephalopathy 05/22/2018  . Allergy   . Anxiety   . Ascites   . C. difficile colitis 07/10/2015  . Cancer (H[facility])    HX OF CANCER OF UTERUS   . Cirrhosis of liver not due to alcohol ([place]) 2016  . Degenerative disk disease   . Diverticulitis   . Gastroparesis   . GERD (gastroesophageal reflux disease)   . History of hiatal hernia   . Hypertension   .  Hypothyroid   . Hypothyroidism due to amiodarone   . Ileus ([place]) 08/01/2015  . Intussusception intestine ([place]) 05/2015  . Orthostatic hypotension   . PAF (paroxysmal atrial fibrillation) ([place]) 03/2015   a. new onset 03/2015 in setting of intractable N/V; b. on Eliquis 5 mg bid; c. CHADSVASc 4 (DM, TIA x 2, female)  . Pancreatitis   . Pneumonia 11/14/2015  . Right ureteral stone 07/14/2016  . Sick sinus syndrome ([place])   . Stomach ulcer   . Stroke [facility])    with minimal left sided weakness  . Syncope 01/2015  . Syncope due to orthostatic hypotension 05/18/2015  . Tachyarrhythmia 01/10/2016  . TIA (transient ischemic attack) 02/2015  . Type 1 diabetes ([place])    on levemir  . UTERINE CANCER, HX OF 03/27/2007   Qualifier: Diagnosis of  By: Maxie Better FNP, Rosalita Levan   . UTI (urinary tract infection) 05/22/2018     Social History   Socioeconomic History  . Marital status: Married    Spouse name: Not on file  . Number of children: Not on file  . Years of education: Not on file  . Highest education level: Not on file  Occupational History  . Occupation: Disabled 2nd back problems  Social Needs  . Financial resource strain: Not on file  . Food insecurity:    Worry: Not on file    Inability: Not on file  . Transportation  needs:    Medical: Not on file    Non-medical: Not on file  Tobacco Use  . Smoking status: Former Smoker    Types: Cigarettes  . Smokeless tobacco: Never Used  . Tobacco comment: 25 years ago and only smoked occasionally  Substance and Sexual Activity  . Alcohol use: No  . Drug use: No  . Sexual activity: Not Currently  Lifestyle  . Physical activity:    Days per week: Not on file    Minutes per session: Not on file  . Stress: Not on file  Relationships  . Social connections:    Talks on phone: Not on file    Gets together: Not on file    Attends religious service: Not on file    Active member of club or organization: Not on file    Attends meetings of clubs  or organizations: Not on file    Relationship status: Not on file  . Intimate partner violence:    Fear of current or ex partner: Not on file    Emotionally abused: Not on file    Physically abused: Not on file    Forced sexual activity: Not on file  Other Topics Concern  . Not on file  Social History Narrative   Lives in [place], [place] with her husband and 2 sons.    Past Surgical History:  Procedure Laterality Date  . ABDOMINAL HYSTERECTOMY    . CARDIAC CATHETERIZATION N/A 01/12/2016   Procedure: Left Heart Cath and Coronary Angiography;  Surgeon: Wellington Hampshire, MD;  Location: [facility];  Service: Cardiovascular;  Laterality: N/A;  . CHOLECYSTECTOMY    . CYSTOSCOPY/URETEROSCOPY/HOLMIUM LASER Right 07/14/2016   Procedure: CYSTOSCOPY/URETEROSCOPY/HOLMIUM LASER;  Surgeon: Alexis Frock, MD;  Location: [facility] ORS;  Service: Urology;  Laterality: Right;  . ESOPHAGOGASTRODUODENOSCOPY N/A 04/04/2015   Procedure: ESOPHAGOGASTRODUODENOSCOPY (EGD);  Surgeon: Hulen Luster, MD;  Location: [facility];  Service: Endoscopy;  Laterality: N/A;  . ESOPHAGOGASTRODUODENOSCOPY N/A 12/28/2017   Procedure: ESOPHAGOGASTRODUODENOSCOPY (EGD);  Surgeon: Lin Landsman, MD;  Location: [facility] ENDOSCOPY;  Service: Gastroenterology;  Laterality: N/A;  . ESOPHAGOGASTRODUODENOSCOPY (EGD) WITH PROPOFOL N/A 01/18/2016   Procedure: ESOPHAGOGASTRODUODENOSCOPY (EGD) WITH PROPOFOL;  Surgeon: Lucilla Lame, MD;  Location: [facility] ENDOSCOPY;  Service: Endoscopy;  Laterality: N/A;  . FLEXIBLE SIGMOIDOSCOPY N/A 01/18/2016   Procedure: FLEXIBLE SIGMOIDOSCOPY;  Surgeon: Lucilla Lame, MD;  Location: [facility] ENDOSCOPY;  Service: Endoscopy;  Laterality: N/A;  . HERNIA REPAIR      Family History  Problem Relation Age of Onset  . Hypertension Mother   . CAD Sister   . Heart attack Sister        Deceased 20-Nov-2014  . CAD Brother     Allergies  Allergen Reactions  . Hydrocodone Other (See Comments)    Pt states that this  medication caused cirrhosis of the liver.    . Aspirin   . Erythromycin Other (See Comments)    Reaction:  Fever   . Prednisone Other (See Comments)    Reaction:  Unknown   . Rosiglitazone Maleate Swelling  . Codeine Sulfate Rash  . Tetanus-Diphtheria Toxoids Td Rash and Other (See Comments)    Reaction:  Fever     Current Outpatient Medications on File Prior to Visit  Medication Sig Dispense Refill  . acetaminophen (TYLENOL) 325 MG tablet Take 650 mg by mouth every 6 (six) hours as needed.    Marland Kitchen albuterol (PROVENTIL HFA;VENTOLIN HFA) 108 (90 Base) MCG/ACT inhaler Inhale 2 puffs into  the lungs every 6 (six) hours as needed for wheezing or shortness of breath. 1 Inhaler 2  . apixaban (ELIQUIS) 5 MG TABS tablet Take 5 mg by mouth 2 (two) times daily.    Marland Kitchen atorvastatin (LIPITOR) 40 MG tablet Take 40 mg by mouth daily.    . Blood Glucose Monitoring Suppl (CONTOUR BLOOD GLUCOSE SYSTEM) w/Device KIT Use to test blood sugar 3 times daily. Dx is E11.65 1 each 0  . calcium carbonate (CALCIUM 600) 600 MG TABS tablet Take 1 tablet (600 mg total) by mouth 2 (two) times daily with a meal. 60 tablet 5  . Cholecalciferol (VITAMIN D3) 125 MCG (5000 UT) CAPS Take 1 capsule (5,000 Units total) by mouth daily with breakfast. Take along with calcium and magnesium. 180 capsule 0  . Cyanocobalamin (VITAMIN B-12) 5000 MCG SUBL Place 1 tablet (5,000 mcg total) under the tongue daily. 30 tablet 5  . cyclobenzaprine (FLEXERIL) 10 MG tablet Take 1 tablet (10 mg total) by mouth 3 (three) times daily as needed for up to 30 days for muscle spasms. Must last 30 days. 90 tablet 0  . ergocalciferol (VITAMIN D2) 1.25 MG (50000 UT) capsule Take 1 capsule (50,000 Units total) by mouth 2 (two) times a week. X 6 weeks. 12 capsule 1  . glucose blood test strip Contour Test Strips. Use as instructed to test blood sugar 3 times daily. 100 each 2  . Lancet Device MISC Use as instructed to test blood sugar 3 times daily. Dx is E11.65  100 each 2  . Magnesium 500 MG CAPS Take 1 capsule (500 mg total) by mouth 2 (two) times daily at 8 am and 10 pm. 60 capsule 5  . metoprolol tartrate (LOPRESSOR) 25 MG tablet Take 1 tablet (25 mg total) by mouth 2 (two) times daily. For heart rate. 180 tablet 3   No current facility-administered medications on file prior to visit.     BP 130/84   Pulse 65   Temp 98 F (36.7 C) (Oral)   Ht 5' 3" (1.6 m)   Wt 122 lb 4 oz (55.5 kg)   SpO2 98%   BMI 21.66 kg/m    Objective:   Physical Exam  Constitutional: She appears well-nourished.  Neck: Neck supple.  Cardiovascular: Normal rate and regular rhythm.  Respiratory: Effort normal and breath sounds normal.  Skin: Skin is warm and dry.  Psychiatric: She has a normal mood and affect.           Assessment & Plan:

## 2019-01-13 NOTE — Assessment & Plan Note (Signed)
Uncontrolled overall with one episode of morning hypoglycemia recently.  Glucose readings throughout the entire day and evening are ranging 300-500. We discussed the importance of eating and checking glucose prior to injecting insulin.  Given elevated readings we will increase morning Levemir to 25 units and continue with 30 units HS.   Given continued uncontrolled readings we will send her to endocrinology for further management. We will plan to see her back in one month if she cannot be seen by then. Repeat A1C due at that time.

## 2019-01-13 NOTE — Patient Instructions (Signed)
We've increased the dose of your Levmir to 25 units in the morning and we've continued your 30 units at bedtime.  Continue to monitor your blood sugars and notify me if you consistently see readings at or below 80.   You will be contacted regarding your referral to the diabetes doctor.  Please let us know if you have not been contacted within one week.   Please schedule a follow up appointment in 1 month for diabetes check.  It was a pleasure to see you today!

## 2019-01-14 ENCOUNTER — Encounter: Payer: Self-pay | Admitting: Pain Medicine

## 2019-01-14 ENCOUNTER — Ambulatory Visit: Payer: Medicare Other | Attending: Pain Medicine | Admitting: Pain Medicine

## 2019-01-14 VITALS — Ht 63.0 in | Wt 122.0 lb

## 2019-01-14 DIAGNOSIS — M5442 Lumbago with sciatica, left side: Secondary | ICD-10-CM

## 2019-01-14 DIAGNOSIS — M79605 Pain in left leg: Secondary | ICD-10-CM

## 2019-01-14 DIAGNOSIS — R52 Pain, unspecified: Secondary | ICD-10-CM

## 2019-01-14 DIAGNOSIS — M79604 Pain in right leg: Secondary | ICD-10-CM

## 2019-01-14 DIAGNOSIS — M51369 Other intervertebral disc degeneration, lumbar region without mention of lumbar back pain or lower extremity pain: Secondary | ICD-10-CM

## 2019-01-14 DIAGNOSIS — G8929 Other chronic pain: Secondary | ICD-10-CM

## 2019-01-14 DIAGNOSIS — G894 Chronic pain syndrome: Secondary | ICD-10-CM

## 2019-01-14 DIAGNOSIS — M5441 Lumbago with sciatica, right side: Secondary | ICD-10-CM

## 2019-01-14 DIAGNOSIS — M5136 Other intervertebral disc degeneration, lumbar region: Secondary | ICD-10-CM | POA: Diagnosis not present

## 2019-01-14 MED ORDER — TRAMADOL HCL 50 MG PO TABS: 50.0000 mg | ORAL_TABLET | Freq: Four times a day (QID) | ORAL | 0 refills | Status: DC | PRN

## 2019-01-26 ENCOUNTER — Ambulatory Visit (INDEPENDENT_AMBULATORY_CARE_PROVIDER_SITE_OTHER): Payer: Medicare Other | Admitting: Primary Care

## 2019-01-26 DIAGNOSIS — R600 Localized edema: Secondary | ICD-10-CM | POA: Diagnosis not present

## 2019-01-26 NOTE — Assessment & Plan Note (Signed)
Suspect edema secondary to frequent Tramadol use given that her swelling began around the same time of initiation, she's had no relief with compression socks and elevation,  and that peripheral edema is a known side effect of Tramadol.  Will have her back down on Tramadol, discussed to use only if needed for pain. She verbalized understanding and will update [name] later this week.

## 2019-01-26 NOTE — Patient Instructions (Signed)
Reduce the frequency of your Tramadol. Try to take the Tramadol once or twice daily if needed.  Continue to elevate your legs and wear compression socks.  Please call us later this week with an update.  It was a pleasure to see you today!

## 2019-01-26 NOTE — Progress Notes (Signed)
Subjective:    Patient ID: [name], female    DOB: Feb 04, 1956, 63 y.o.   MRN: [id number]  HPI  Virtual Visit via Video Note  I connected with [name] on 01/26/19 at  2:00 PM EDT by a video enabled telemedicine application and verified that I am speaking with the correct person using two identifiers.  Location: Patient: Home Provider: Office   I discussed the limitations of evaluation and management by telemedicine and the availability of in person appointments. The patient expressed understanding and agreed to proceed.  Patient does not have video capability so our visit was conducted via phone. Phone call lasted 6 minutes and 45 seconds.  History of Present Illness:  [name] is a 63 year old female with a history of paroxysmal atrial fibrillation, type 2 diabetes, hypertension, tachy-brady syndrome, CVA, CAD who presents today via phone with a chief complaint of lower extremity edema.  The edema is located to the bilateral feet and lower extremities up to her knees. Her swelling has been present for the last 7-10 days. She's been wearing compression socks over the last four days during the day and she is also elevating her legs without improvement. She does not check her blood pressure. Her glucose readings are running in the high 100's-low 300's.   She denies erythema, open wounds, calf pain, shortness of breath, cough. She was put on Tramadol 50 mg every 6 hours per pain management on 01/14/19, her swelling started about the same time. She is taking her Tramadol every 6 hours.   Her echocardiogram from April 2019 with LVEF of 55-60%, grossly normal with the exception of mild to moderate mitral valve regurgitation.    Observations/Objective:  Alert and oriented. No distress. Speaking in complete sentences.  Unable to visualize lower extremities due to limitations of patient's visit. She has no video capability.  Assessment and Plan:  Suspect edema  secondary to frequent Tramadol use given that her swelling began around the same time of initiation, she's had no relief with compression socks and elevation,  and that peripheral edema is a known side effect of Tramadol.  Will have her back down on Tramadol, discussed to use only if needed for pain. She verbalized understanding and will update [name] later this week.   Follow Up Instructions:  Reduce the frequency of your Tramadol. Try to take the Tramadol once or twice daily if needed.  Continue to elevate your legs and wear compression socks.  Please call us later this week with an update.  It was a pleasure to see you today!    I discussed the assessment and treatment plan with the patient. The patient was provided an opportunity to ask questions and all were answered. The patient agreed with the plan and demonstrated an understanding of the instructions.   The patient was advised to call back or seek an in-person evaluation if the symptoms worsen or if the condition fails to improve as anticipated.     [name], NP    Review of Systems  Constitutional: Negative for fever.  Respiratory: Negative for shortness of breath.   Cardiovascular: Positive for leg swelling. Negative for chest pain.  Skin: Negative for color change, rash and wound.  Neurological: Negative for dizziness.       Past Medical History:  Diagnosis Date  . Acute encephalopathy 05/22/2018  . Allergy   . Anxiety   . Ascites   . C. difficile colitis 07/10/2015  . Cancer (  [place])    HX OF CANCER OF UTERUS   . Cirrhosis of liver not due to alcohol ([place]) 2016  . Degenerative disk disease   . Diverticulitis   . Gastroparesis   . GERD (gastroesophageal reflux disease)   . History of hiatal hernia   . Hypertension   . Hypothyroid   . Hypothyroidism due to amiodarone   . Ileus ([place]) 08/01/2015  . Intussusception intestine ([place]) 05/2015  . Orthostatic hypotension   . PAF (paroxysmal atrial  fibrillation) ([place]) 03/2015   a. new onset 03/2015 in setting of intractable N/V; b. on Eliquis 5 mg bid; c. CHADSVASc 4 (DM, TIA x 2, female)  . Pancreatitis   . Pneumonia 11/14/2015  . Right ureteral stone 07/14/2016  . Sick sinus syndrome ([place])   . Stomach ulcer   . Stroke [facility])    with minimal left sided weakness  . Syncope 01/2015  . Syncope due to orthostatic hypotension 05/18/2015  . Tachyarrhythmia 01/10/2016  . TIA (transient ischemic attack) 02/2015  . Type 1 diabetes ([place])    on levemir  . UTERINE CANCER, HX OF 03/27/2007   Qualifier: Diagnosis of  By: Maxie Better FNP, Rosalita Levan   . UTI (urinary tract infection) 05/22/2018     Social History   Socioeconomic History  . Marital status: Married    Spouse name: Not on file  . Number of children: Not on file  . Years of education: Not on file  . Highest education level: Not on file  Occupational History  . Occupation: Disabled 2nd back problems  Social Needs  . Financial resource strain: Not on file  . Food insecurity:    Worry: Not on file    Inability: Not on file  . Transportation needs:    Medical: Not on file    Non-medical: Not on file  Tobacco Use  . Smoking status: Former Smoker    Types: Cigarettes  . Smokeless tobacco: Never Used  . Tobacco comment: 25 years ago and only smoked occasionally  Substance and Sexual Activity  . Alcohol use: No  . Drug use: No  . Sexual activity: Not Currently  Lifestyle  . Physical activity:    Days per week: Not on file    Minutes per session: Not on file  . Stress: Not on file  Relationships  . Social connections:    Talks on phone: Not on file    Gets together: Not on file    Attends religious service: Not on file    Active member of club or organization: Not on file    Attends meetings of clubs or organizations: Not on file    Relationship status: Not on file  . Intimate partner violence:    Fear of current or ex partner: Not on file    Emotionally abused: Not on  file    Physically abused: Not on file    Forced sexual activity: Not on file  Other Topics Concern  . Not on file  Social History Narrative   Lives in [place], [place] with her husband and 2 sons.    Past Surgical History:  Procedure Laterality Date  . ABDOMINAL HYSTERECTOMY    . CARDIAC CATHETERIZATION N/A 01/12/2016   Procedure: Left Heart Cath and Coronary Angiography;  Surgeon: Wellington Hampshire, MD;  Location: [facility];  Service: Cardiovascular;  Laterality: N/A;  . CHOLECYSTECTOMY    . CYSTOSCOPY/URETEROSCOPY/HOLMIUM LASER Right 07/14/2016   Procedure: CYSTOSCOPY/URETEROSCOPY/HOLMIUM LASER;  Surgeon: Alexis Frock, MD;  Location: [facility] ORS;  Service: Urology;  Laterality: Right;  . ESOPHAGOGASTRODUODENOSCOPY N/A 04/04/2015   Procedure: ESOPHAGOGASTRODUODENOSCOPY (EGD);  Surgeon: Hulen Luster, MD;  Location: [facility] ENDOSCOPY;  Service: Endoscopy;  Laterality: N/A;  . ESOPHAGOGASTRODUODENOSCOPY N/A 12/28/2017   Procedure: ESOPHAGOGASTRODUODENOSCOPY (EGD);  Surgeon: Lin Landsman, MD;  Location: [facility] ENDOSCOPY;  Service: Gastroenterology;  Laterality: N/A;  . ESOPHAGOGASTRODUODENOSCOPY (EGD) WITH PROPOFOL N/A 01/18/2016   Procedure: ESOPHAGOGASTRODUODENOSCOPY (EGD) WITH PROPOFOL;  Surgeon: Lucilla Lame, MD;  Location: [facility] ENDOSCOPY;  Service: Endoscopy;  Laterality: N/A;  . FLEXIBLE SIGMOIDOSCOPY N/A 01/18/2016   Procedure: FLEXIBLE SIGMOIDOSCOPY;  Surgeon: Lucilla Lame, MD;  Location: [facility] ENDOSCOPY;  Service: Endoscopy;  Laterality: N/A;  . HERNIA REPAIR      Family History  Problem Relation Age of Onset  . Hypertension Mother   . CAD Sister   . Heart attack Sister        Deceased Nov 27, 2014  . CAD Brother     Allergies  Allergen Reactions  . Aspirin Rash  . Codeine Sulfate Rash  . Erythromycin Rash    Reaction:  Fever   . Prednisone Swelling  . Rosiglitazone Maleate Swelling  . Tetanus-Diphtheria Toxoids Td Rash and Other (See Comments)    Reaction:  Fever      Current Outpatient Medications on File Prior to Visit  Medication Sig Dispense Refill  . albuterol (PROVENTIL HFA;VENTOLIN HFA) 108 (90 Base) MCG/ACT inhaler Inhale 2 puffs into the lungs every 6 (six) hours as needed for wheezing or shortness of breath. 1 Inhaler 2  . apixaban (ELIQUIS) 5 MG TABS tablet Take 5 mg by mouth 2 (two) times daily.    Marland Kitchen atorvastatin (LIPITOR) 40 MG tablet Take 40 mg by mouth daily.    . Blood Glucose Monitoring Suppl (CONTOUR BLOOD GLUCOSE SYSTEM) w/Device KIT Use to test blood sugar 3 times daily. Dx is E11.65 1 each 0  . calcium carbonate (CALCIUM 600) 600 MG TABS tablet Take 1 tablet (600 mg total) by mouth 2 (two) times daily with a meal. 60 tablet 5  . Cholecalciferol (VITAMIN D3) 125 MCG (5000 UT) CAPS Take 1 capsule (5,000 Units total) by mouth daily with breakfast. Take along with calcium and magnesium. 180 capsule 0  . Cyanocobalamin (VITAMIN B-12) 5000 MCG SUBL Place 1 tablet (5,000 mcg total) under the tongue daily. 30 tablet 5  . cyclobenzaprine (FLEXERIL) 10 MG tablet Take 1 tablet (10 mg total) by mouth 3 (three) times daily as needed for up to 30 days for muscle spasms. Must last 30 days. 90 tablet 0  . ergocalciferol (VITAMIN D2) 1.25 MG (50000 UT) capsule Take 1 capsule (50,000 Units total) by mouth 2 (two) times a week. X 6 weeks. 12 capsule 1  . glucose blood test strip Contour Test Strips. Use as instructed to test blood sugar 3 times daily. 100 each 2  . Insulin Detemir (LEVEMIR) 100 UNIT/ML Pen Inject 25 units in the morning and 30 units at bedtime. 15 mL 5  . Lancet Device MISC Use as instructed to test blood sugar 3 times daily. Dx is E11.65 100 each 2  . Magnesium 500 MG CAPS Take 1 capsule (500 mg total) by mouth 2 (two) times daily at 8 am and 10 pm. 60 capsule 5  . metoprolol tartrate (LOPRESSOR) 25 MG tablet Take 1 tablet (25 mg total) by mouth 2 (two) times daily. For heart rate. 180 tablet 3  . naproxen sodium (ALEVE) 220 MG tablet Take 220  mg by  mouth.    . traMADol (ULTRAM) 50 MG tablet Take 1 tablet (50 mg total) by mouth every 6 (six) hours as needed for up to 30 days for severe pain. Must last 30 days. 120 tablet 0   No current facility-administered medications on file prior to visit.     There were no vitals taken for this visit.   Objective:   Physical Exam  Constitutional: She is oriented to person, place, and time. She appears well-nourished.  Respiratory: Effort normal.  Neurological: She is alert and oriented to person, place, and time.  Psychiatric: She has a normal mood and affect.           Assessment & Plan:

## 2019-01-30 ENCOUNTER — Telehealth: Payer: Self-pay | Admitting: *Deleted

## 2019-01-30 DIAGNOSIS — R600 Localized edema: Secondary | ICD-10-CM

## 2019-01-30 MED ORDER — FUROSEMIDE 20 MG PO TABS: 20.0000 mg | ORAL_TABLET | Freq: Every day | ORAL | 0 refills | Status: DC

## 2019-01-30 NOTE — Telephone Encounter (Signed)
Patient left a voicemail stating that she was suppose to report back to [name] NP on how she is doing. Patient stated that the swelling in her feet and legs are no better and may even be a little worse.

## 2019-01-30 NOTE — Telephone Encounter (Signed)
Message left for patient to return my call.  

## 2019-01-30 NOTE — Telephone Encounter (Signed)
Please notify patient that I sent in a prescription for furosemide to her pharmacy. Take 1 tablet by mouth once daily for 4 days. Have her schedule an appointment with me if no improvement after this. Also have her monitor her BP as this may lower.

## 2019-02-03 ENCOUNTER — Telehealth: Payer: Self-pay | Admitting: *Deleted

## 2019-02-03 NOTE — Telephone Encounter (Signed)
Please notify patient:  Start monitoring your blood pressure daily, around the same time of day. Ensure that you have rested for 30 minutes prior to checking your blood pressure. Record your readings and notify me if you see readings at or above 135/90 consistently. Have her also monitor heart rate and notify me if she sees readings at or above 100 consistently.

## 2019-02-03 NOTE — Telephone Encounter (Signed)
Patient left a voicemail stating that she just received her blood pressure monitoring kit. Patient wants to know how often she should check her blood pressure?

## 2019-02-03 NOTE — Telephone Encounter (Signed)
Spoken and notified patient of [name] comments. Patient verbalized understanding.  Patient requested refill of Levemir. However, there are already refills at the pharmacy. Patient is aware.

## 2019-02-03 NOTE — Telephone Encounter (Signed)
Spoken and notified patient of [name]'s comments. Patient verbalized understanding.  

## 2019-02-03 NOTE — Telephone Encounter (Signed)
Copied from [place] [phone]. Topic: General - Other >> Jan 30, 2019  4:10 PM Pauline Good wrote: Reason for CRM: pt returning call to [facility]

## 2019-02-04 NOTE — Telephone Encounter (Signed)
Spoken and notified patient of [name]'s comments. Patient verbalized understanding.  

## 2019-02-05 ENCOUNTER — Telehealth: Payer: Self-pay

## 2019-02-05 MED ORDER — GLUCOSE BLOOD VI STRP: ORAL_STRIP | 5 refills | Status: DC

## 2019-02-05 NOTE — Telephone Encounter (Signed)
Pt left v/m requesting refill test strips.Please advise.

## 2019-02-05 NOTE — Telephone Encounter (Signed)
Pt returned your call she is aware refill as requested comment

## 2019-02-05 NOTE — Telephone Encounter (Signed)
Message left for patient to return my call.  Refill as requested.

## 2019-02-06 ENCOUNTER — Telehealth: Payer: Self-pay | Admitting: Primary Care

## 2019-02-06 NOTE — Telephone Encounter (Signed)
Pt called wanting to talk to [facility]. States she is still having swelling in ankles and feet. It's difficult for her to put shoes on. She moved appointment from 6/5 to 6/1.

## 2019-02-06 NOTE — Telephone Encounter (Signed)
Noted and will evaluate on 02/09/19.

## 2019-02-09 ENCOUNTER — Ambulatory Visit (INDEPENDENT_AMBULATORY_CARE_PROVIDER_SITE_OTHER): Payer: Medicare Other | Admitting: Primary Care

## 2019-02-09 ENCOUNTER — Encounter: Payer: Self-pay | Admitting: Primary Care

## 2019-02-09 ENCOUNTER — Telehealth: Payer: Self-pay | Admitting: *Deleted

## 2019-02-09 ENCOUNTER — Other Ambulatory Visit: Payer: Self-pay

## 2019-02-09 VITALS — BP 110/72 | HR 58 | Temp 98.2°F | Ht 63.0 in | Wt 130.8 lb

## 2019-02-09 DIAGNOSIS — Z20828 Contact with and (suspected) exposure to other viral communicable diseases: Secondary | ICD-10-CM

## 2019-02-09 DIAGNOSIS — R05 Cough: Secondary | ICD-10-CM | POA: Diagnosis not present

## 2019-02-09 DIAGNOSIS — R600 Localized edema: Secondary | ICD-10-CM

## 2019-02-09 DIAGNOSIS — Z20822 Contact with and (suspected) exposure to covid-19: Secondary | ICD-10-CM

## 2019-02-09 DIAGNOSIS — E1165 Type 2 diabetes mellitus with hyperglycemia: Secondary | ICD-10-CM | POA: Diagnosis not present

## 2019-02-09 DIAGNOSIS — R438 Other disturbances of smell and taste: Secondary | ICD-10-CM

## 2019-02-09 LAB — POCT GLYCOSYLATED HEMOGLOBIN (HGB A1C): Hemoglobin A1C: 8.3 % — AB (ref 4.0–5.6)

## 2019-02-09 MED ORDER — INSULIN DETEMIR 100 UNIT/ML FLEXPEN: PEN_INJECTOR | SUBCUTANEOUS | 5 refills | Status: DC

## 2019-02-09 NOTE — Telephone Encounter (Signed)
Pt returned call and schedule for testing on 02/10/19 at the [facility] location.

## 2019-02-09 NOTE — Telephone Encounter (Signed)
Patient called and offered scheduling for COVID-19 Pt states that she will return call once finds out what time she will have transportation

## 2019-02-09 NOTE — Assessment & Plan Note (Signed)
Should be overall low risk but has been to the grocery store twice within the last 2 weeks. She does wear a mask.  Symptoms do sound suspicious especially given loss of taste with cough and shortness of breath. Will refer her for Covid testing.

## 2019-02-09 NOTE — Progress Notes (Signed)
 Subjective:    Patient ID: [name], female    DOB: 02/02/1956, 63 y.o.   MRN: [id number]  HPI  [name] is a 63 year old female who presents today for follow up of diabetes and a chief complaint of extremity edema.   1) Type 2 Diabetes: Current medications include: Levemir 25 units AM and 30 units PM.  She is checking her blood glucose 3 times daily and is getting readings of:  AM Fasting: mid 100's-mid 200's Before Lunch: mid 100's-high 200's Bedtime: high 200's, low 300's   Last A1C: 9.4 in early March 2020 Last Eye Exam: Due Last Foot Exam: Due in January 2021 Pneumonia Vaccination: ACE/ARB: Urine microalbumin negative 09/2018 Statin: atorvastatin    2) Lower Extremity Edema: Bilateral. Present since initiation of Tramadol about one month ago. She had no extremity edema prior to Tramadol. She called into our office last week with reports of even more lower extremity edema so we discussed to reduce the frequency of Tramadol. She hasn't noticed a decrease in edema since. She has an appointment with her pain management doctor soon and plans on discuss.   3) Cough: Present for the last three days, also with shortness of breath, loss of taste, rhinorrhea. She denies fevers. She's only been out to the grocery store, to our office, and to the pharmacy since Covid-19. She's been to the grocery store on May 22nd and 28th. Her spouse doesn't have any symptoms.    Review of Systems  Constitutional: Negative for fever.  HENT:       Loss of sense of taste  Respiratory: Positive for cough and shortness of breath.   Cardiovascular: Positive for leg swelling. Negative for chest pain.  Neurological: Negative for dizziness and headaches.       Past Medical History:  Diagnosis Date  . Acute encephalopathy 05/22/2018  . Allergy   . Anxiety   . Ascites   . C. difficile colitis 07/10/2015  . Cancer ([facility])    HX OF CANCER OF UTERUS   . Cirrhosis of liver not due to alcohol ([facility])  2016  . Degenerative disk disease   . Diverticulitis   . Gastroparesis   . GERD (gastroesophageal reflux disease)   . History of hiatal hernia   . Hypertension   . Hypothyroid   . Hypothyroidism due to amiodarone   . Ileus ([facility]) 08/01/2015  . Intussusception intestine ([facility]) 05/2015  . Orthostatic hypotension   . PAF (paroxysmal atrial fibrillation) ([facility]) 03/2015   a. new onset 03/2015 in setting of intractable N/V; b. on Eliquis 5 mg bid; c. CHADSVASc 4 (DM, TIA x 2, female)  . Pancreatitis   . Pneumonia 11/14/2015  . Right ureteral stone 07/14/2016  . Sick sinus syndrome ([facility])   . Stomach ulcer   . Stroke ([facility])    with minimal left sided weakness  . Syncope 01/2015  . Syncope due to orthostatic hypotension 05/18/2015  . Tachyarrhythmia 01/10/2016  . TIA (transient ischemic attack) 02/2015  . Type 1 diabetes ([facility])    on levemir  . UTERINE CANCER, HX OF 03/27/2007   Qualifier: Diagnosis of  By: Bean FNP, Billie-Lynn Daniels   . UTI (urinary tract infection) 05/22/2018     Social History   Socioeconomic History  . Marital status: Married    Spouse name: Not on file  . Number of children: Not on file  . Years of education: Not on file  . Highest education level: Not on file    Occupational History  . Occupation: Disabled 2nd back problems  Social Needs  . Financial resource strain: Not on file  . Food insecurity:    Worry: Not on file    Inability: Not on file  . Transportation needs:    Medical: Not on file    Non-medical: Not on file  Tobacco Use  . Smoking status: Former Smoker    Types: Cigarettes  . Smokeless tobacco: Never Used  . Tobacco comment: 25 years ago and only smoked occasionally  Substance and Sexual Activity  . Alcohol use: No  . Drug use: No  . Sexual activity: Not Currently  Lifestyle  . Physical activity:    Days per week: Not on file    Minutes per session: Not on file  . Stress: Not on file  Relationships  . Social connections:    Talks on phone:  Not on file    Gets together: Not on file    Attends religious service: Not on file    Active member of club or organization: Not on file    Attends meetings of clubs or organizations: Not on file    Relationship status: Not on file  . Intimate partner violence:    Fear of current or ex partner: Not on file    Emotionally abused: Not on file    Physically abused: Not on file    Forced sexual activity: Not on file  Other Topics Concern  . Not on file  Social History Narrative   Lives in [place], [place] with her husband and 2 sons.    Past Surgical History:  Procedure Laterality Date  . ABDOMINAL HYSTERECTOMY    . CARDIAC CATHETERIZATION N/A 01/12/2016   Procedure: Left Heart Cath and Coronary Angiography;  Surgeon: Wellington Hampshire, MD;  Location: [facility];  Service: Cardiovascular;  Laterality: N/A;  . CHOLECYSTECTOMY    . CYSTOSCOPY/URETEROSCOPY/HOLMIUM LASER Right 07/14/2016   Procedure: CYSTOSCOPY/URETEROSCOPY/HOLMIUM LASER;  Surgeon: Alexis Frock, MD;  Location: [facility] ORS;  Service: Urology;  Laterality: Right;  . ESOPHAGOGASTRODUODENOSCOPY N/A 04/04/2015   Procedure: ESOPHAGOGASTRODUODENOSCOPY (EGD);  Surgeon: Hulen Luster, MD;  Location: [facility] ENDOSCOPY;  Service: Endoscopy;  Laterality: N/A;  . ESOPHAGOGASTRODUODENOSCOPY N/A 12/28/2017   Procedure: ESOPHAGOGASTRODUODENOSCOPY (EGD);  Surgeon: Lin Landsman, MD;  Location: [facility] ENDOSCOPY;  Service: Gastroenterology;  Laterality: N/A;  . ESOPHAGOGASTRODUODENOSCOPY (EGD) WITH PROPOFOL N/A 01/18/2016   Procedure: ESOPHAGOGASTRODUODENOSCOPY (EGD) WITH PROPOFOL;  Surgeon: Lucilla Lame, MD;  Location: [facility] ENDOSCOPY;  Service: Endoscopy;  Laterality: N/A;  . FLEXIBLE SIGMOIDOSCOPY N/A 01/18/2016   Procedure: FLEXIBLE SIGMOIDOSCOPY;  Surgeon: Lucilla Lame, MD;  Location: [facility] ENDOSCOPY;  Service: Endoscopy;  Laterality: N/A;  . HERNIA REPAIR      Family History  Problem Relation Age of Onset  . Hypertension Mother   . CAD  Sister   . Heart attack Sister        Deceased 11-26-2014  . CAD Brother     Allergies  Allergen Reactions  . Aspirin Rash  . Codeine Sulfate Rash  . Erythromycin Rash    Reaction:  Fever   . Prednisone Swelling  . Rosiglitazone Maleate Swelling  . Tetanus-Diphtheria Toxoids Td Rash and Other (See Comments)    Reaction:  Fever     Current Outpatient Medications on File Prior to Visit  Medication Sig Dispense Refill  . albuterol (PROVENTIL HFA;VENTOLIN HFA) 108 (90 Base) MCG/ACT inhaler Inhale 2 puffs into the lungs every 6 (six) hours as needed for wheezing or  shortness of breath. 1 Inhaler 2  . apixaban (ELIQUIS) 5 MG TABS tablet Take 5 mg by mouth 2 (two) times daily.    Marland Kitchen atorvastatin (LIPITOR) 40 MG tablet Take 40 mg by mouth daily.    . Blood Glucose Monitoring Suppl (CONTOUR BLOOD GLUCOSE SYSTEM) w/Device KIT Use to test blood sugar 3 times daily. Dx is E11.65 1 each 0  . calcium carbonate (CALCIUM 600) 600 MG TABS tablet Take 1 tablet (600 mg total) by mouth 2 (two) times daily with a meal. 60 tablet 5  . Cholecalciferol (VITAMIN D3) 125 MCG (5000 UT) CAPS Take 1 capsule (5,000 Units total) by mouth daily with breakfast. Take along with calcium and magnesium. 180 capsule 0  . Cyanocobalamin (VITAMIN B-12) 5000 MCG SUBL Place 1 tablet (5,000 mcg total) under the tongue daily. 30 tablet 5  . ergocalciferol (VITAMIN D2) 1.25 MG (50000 UT) capsule Take 1 capsule (50,000 Units total) by mouth 2 (two) times a week. X 6 weeks. 12 capsule 1  . glucose blood (ACCU-CHEK AVIVA PLUS) test strip Use as instructed to test blood sugar 3 times daily 100 each 5  . Lancet Device MISC Use as instructed to test blood sugar 3 times daily. Dx is E11.65 100 each 2  . Magnesium 500 MG CAPS Take 1 capsule (500 mg total) by mouth 2 (two) times daily at 8 am and 10 pm. 60 capsule 5  . metoprolol tartrate (LOPRESSOR) 25 MG tablet Take 1 tablet (25 mg total) by mouth 2 (two) times daily. For heart rate. 180  tablet 3  . naproxen sodium (ALEVE) 220 MG tablet Take 220 mg by mouth.    . traMADol (ULTRAM) 50 MG tablet Take 1 tablet (50 mg total) by mouth every 6 (six) hours as needed for up to 30 days for severe pain. Must last 30 days. 120 tablet 0  . cyclobenzaprine (FLEXERIL) 10 MG tablet Take 1 tablet (10 mg total) by mouth 3 (three) times daily as needed for up to 30 days for muscle spasms. Must last 30 days. 90 tablet 0   No current facility-administered medications on file prior to visit.     BP 110/72   Pulse (!) 58   Temp 98.2 F (36.8 C) (Oral)   Ht 5' 3" (1.6 m)   Wt 130 lb 12.8 oz (59.3 kg)   SpO2 98%   BMI 23.17 kg/m    Objective:   Physical Exam  Constitutional: She appears well-nourished. She does not have a sickly appearance. She does not appear ill.  Neck: Neck supple.  Cardiovascular: Normal rate. An irregularly irregular rhythm present.  Pulses:      Dorsalis pedis pulses are 2+ on the right side and 2+ on the left side.       Posterior tibial pulses are 2+ on the right side and 2+ on the left side.  Moderate bilateral lower extremity edema from chin to feet.  Trace pitting to left.   Respiratory: Effort normal and breath sounds normal. She has no wheezes.  Skin: Skin is warm and dry.           Assessment & Plan:

## 2019-02-09 NOTE — Assessment & Plan Note (Signed)
No significant improvement, continues to take Tramadol for chronic pain. Do still suspect this to be contributing. Will have her discuss with pain management soon. Continue to elevate legs.   Echo from 2019 with LVEF of 55-60%. She appears euvolemic otherwise. Much better control of glucose.

## 2019-02-09 NOTE — Assessment & Plan Note (Signed)
Improving with a further reduction in A1C to 8.3! Commended her on frequent glucose checks and compliance to her regimen.  Given higher evening readings we will increase her Levemir to 30 units in the AM and continue her 30 units in the PM.   We will plan to see her back in 3 months or sooner if she starts to experience consistent hyperglycemia above 300.

## 2019-02-09 NOTE — Patient Instructions (Addendum)
We've changed your Levemir to 30 units in the morning and 30 units at bedtime.  Make sure to eat three times daily.  Continue to check your blood sugars as discussed.  Please schedule a follow up appointment in 3 months.  It was a pleasure to see you today!

## 2019-02-10 ENCOUNTER — Other Ambulatory Visit: Payer: Medicare Other

## 2019-02-10 DIAGNOSIS — Z20822 Contact with and (suspected) exposure to covid-19: Secondary | ICD-10-CM

## 2019-02-11 ENCOUNTER — Other Ambulatory Visit: Payer: Self-pay

## 2019-02-11 ENCOUNTER — Ambulatory Visit: Payer: Medicare Other | Attending: Pain Medicine | Admitting: Pain Medicine

## 2019-02-11 DIAGNOSIS — M79605 Pain in left leg: Secondary | ICD-10-CM

## 2019-02-11 DIAGNOSIS — M5442 Lumbago with sciatica, left side: Secondary | ICD-10-CM | POA: Diagnosis not present

## 2019-02-11 DIAGNOSIS — R609 Edema, unspecified: Secondary | ICD-10-CM

## 2019-02-11 DIAGNOSIS — M797 Fibromyalgia: Secondary | ICD-10-CM | POA: Diagnosis not present

## 2019-02-11 DIAGNOSIS — M7918 Myalgia, other site: Secondary | ICD-10-CM

## 2019-02-11 DIAGNOSIS — G894 Chronic pain syndrome: Secondary | ICD-10-CM | POA: Diagnosis not present

## 2019-02-11 DIAGNOSIS — M5441 Lumbago with sciatica, right side: Secondary | ICD-10-CM

## 2019-02-11 DIAGNOSIS — G8929 Other chronic pain: Secondary | ICD-10-CM

## 2019-02-11 DIAGNOSIS — E8809 Other disorders of plasma-protein metabolism, not elsewhere classified: Secondary | ICD-10-CM

## 2019-02-11 DIAGNOSIS — M79604 Pain in right leg: Secondary | ICD-10-CM

## 2019-02-11 DIAGNOSIS — K7460 Unspecified cirrhosis of liver: Secondary | ICD-10-CM | POA: Insufficient documentation

## 2019-02-11 LAB — NOVEL CORONAVIRUS, NAA: SARS-CoV-2, NAA: NOT DETECTED

## 2019-02-11 MED ORDER — OXYCODONE HCL 5 MG PO TABS: 5.0000 mg | ORAL_TABLET | Freq: Four times a day (QID) | ORAL | 0 refills | Status: DC | PRN

## 2019-02-11 MED ORDER — CYCLOBENZAPRINE HCL 10 MG PO TABS: 10.0000 mg | ORAL_TABLET | Freq: Three times a day (TID) | ORAL | 2 refills | Status: DC | PRN

## 2019-02-11 NOTE — Progress Notes (Signed)
Pain Management Virtual Encounter Note - Virtual Visit via Telephone Telehealth (real-time audio visits between healthcare provider and patient).  Patient's Phone No. & Preferred Pharmacy:  [phone] (home); There is no such number on file (mobile).; (Preferred) [phone] No e-mail address on record  [place], [place], [address], [place] [address] Phone: [phone] Fax: [phone]  [place], [place] - [address] [place] [address] Phone: [phone] Fax: [phone]   Pre-screening note:  Our staff contacted [name] and offered her an "in person", "face-to-face" appointment versus a telephone encounter. She indicated preferring the telephone encounter, at this time.  Reason for Virtual Visit: COVID-19*  Social distancing based on CDC and AMA recommendations.   I contacted [name] on 02/11/2019 at 11:40 AM via telephone. I initially attempted to contact the patient at 11:40 AM, but she did not answer the phone and I left a message.  Since the patient did not call me back I attempted again to call her and this time I did contact her at approximately 12:30 PM. I clearly identified myself as Gaspar Cola, MD. I verified that I was speaking with the correct person using two identifiers (Name and date of birth: 08-26-1956).  Advanced Informed Consent I sought verbal advanced consent from [name] for virtual visit interactions. I informed [name] of possible security and privacy concerns, risks, and limitations associated with providing "not-in-person" medical evaluation and management services. I also informed [name] of the availability of "in-person" appointments. Finally, I informed her that there would be a charge for the virtual visit and that she could be  personally, fully or partially, financially responsible for it. [name] expressed  understanding and agreed to proceed.   Historic Elements   [name] is a 63 y.o. year old, female patient evaluated today after her last encounter by our practice on 01/14/2019. [name]  has a past medical history of Acute encephalopathy (05/22/2018), Allergy, Anxiety, Ascites, C. difficile colitis (07/10/2015), Cancer ([place]), Cirrhosis of liver not due to alcohol ([place]) (2016), Degenerative disk disease, Diverticulitis, Gastroparesis, GERD (gastroesophageal reflux disease), History of hiatal hernia, Hypertension, Hypothyroid, Hypothyroidism due to amiodarone, Ileus ([place]) (08/01/2015), Intussusception intestine ([place]) (05/2015), Orthostatic hypotension, PAF (paroxysmal atrial fibrillation) ([place]) (03/2015), Pancreatitis, Pneumonia (11/14/2015), Right ureteral stone (07/14/2016), Sick sinus syndrome ([place]), Stomach ulcer, Stroke ([place]), Syncope (01/2015), Syncope due to orthostatic hypotension (05/18/2015), Tachyarrhythmia (01/10/2016), TIA (transient ischemic attack) (02/2015), Type 1 diabetes ([place]), UTERINE CANCER, HX OF (03/27/2007), and UTI (urinary tract infection) (05/22/2018). She also  has a past surgical history that includes Hernia repair; Abdominal hysterectomy; Cholecystectomy; Esophagogastroduodenoscopy (N/A, 04/04/2015); Cardiac catheterization (N/A, 01/12/2016); Esophagogastroduodenoscopy (egd) with propofol (N/A, 01/18/2016); Flexible sigmoidoscopy (N/A, 01/18/2016); Cystoscopy/ureteroscopy/holmium laser (Right, 07/14/2016); and Esophagogastroduodenoscopy (N/A, 12/28/2017). [name] has a current medication list which includes the following prescription(s): cyclobenzaprine, oxycodone, albuterol, apixaban, atorvastatin, contour blood glucose system, calcium carbonate, vitamin d3, vitamin b-12, ergocalciferol, glucose blood, insulin detemir, lancet device, magnesium, metoprolol tartrate, and naproxen sodium. She  reports that she has quit smoking. Her smoking use included cigarettes. She has never used smokeless  tobacco. She reports that she does not drink alcohol or use drugs. [name] is allergic to aspirin; codeine sulfate; erythromycin; prednisone; rosiglitazone maleate; and tetanus-diphtheria toxoids td.   HPI  Today, she is being contacted for medication management.  The patient was contacted today to  evaluate the tramadol 50 mg every 6 hour trial.  She was previously on Xtampza Er 9 MG.  The patient indicates today that she has noticed more swelling of the lower extremities which she attributed to the tramadol.  In reviewing her medical record, her kidney function seems to be adequate, but she does have a history of hepatic cirrhosis with hypoalbuminemia that it is very likely responsible for the swelling that she is experiencing in her lower extremities.  Edema of the lower extremities is a possible side effect of some types of medications, but in my experience it tends to be rare with tramadol.  However, she is under the impression that this is the case and she wants to try a different type of medication.  She consulted her primary care and she indicated that she was told that I was going to get a communication regarding her side effects.  She was under the wrong impression that this was an allergic reaction to tramadol and so today I took a long time to explain to her the difference between side effects and allergic reactions.  I used the example of her allergy to codeine, which she indicated was nausea, vomiting, and a rash.  However, at the time that this occurred she was also taking what types of medicines.  I explained to her that the nausea and vomiting are side effects and although a rash could be an allergic reaction, it could have also come from all other medications.  I went further and I explained to her that codeine is a pro drug and when it gets taken by mouth, it is absorbed and in the liver it is transformed to morphine.  At this point she pointed out that she has absolutely no problems with  taking morphine.  She denies any side effects or any type of allergic reactions to the use of morphine and this is why I really doubt that she also has an allergy to the codeine.  However, because she has hepatic cirrhosis, it would not be a good idea for her to use any type of medication that requires appropriate liver function in order to transform into the active form of the drug.  Because of a similar reason I will not be giving this patient any medication combined with acetaminophen so asked to avoid stressing her already compromised liver.  Today I will be starting a trial of oxycodone IR 5 mg p.o. every 6 hours.  The patient was instructed never to take more than 1 tablet at a time and never to take the medicine any sooner than every 6 hours.  The patient was also instructed not to take the medicine if she was not having any pain and never to take more than 4 tablets/day.  I will contact the patient in 30 days to see how she is doing but she was instructed to give me a call if she experiences any type of abnormal reactions or side effects.  She was also instructed to immediately stop taking the medicine if she experiences any type of allergic reactions and to immediately seek medical attention if such a reaction is of concern.  Although I could be wrong, it is my impression that the patient's lower extremity edema is unlikely to be secondary to the tramadol and much more likely to be secondary to the patient's known hepatic cirrhosis and hypoalbuminemia.  Pharmacotherapy Assessment  Analgesic: Tramadol 50 mg 1 tablet p.o. every 6 hours (200 mg/day of tramadol).  Today  this will be discontinued and we will be starting the patient on oxycodone IR 5 mg 1 tablet p.o. every 6 hours (20 mg/day of oxycodone) MME/day: 20 mg/day.  This will be increasing to 30 MME's with the use of the oxycodone.  Monitoring: Pharmacotherapy: The patient indicates having experienced some swelling of the lower extremities  after she started taking the tramadol.  However, she does have a history of hepatic cirrhosis with hypoalbuminemia which is known to cause lower extremity swelling.  Furthermore, her PCP instructed her to go down on the tramadol, which she did, but it did not improve the lower extremity swelling.  This would suggest that it is not related to the medication. [place] PMP: PDMP reviewed during this encounter.       Compliance: No problems identified. Effectiveness: The patient indicated that the medication was not covering her pain and was given her some adverse reactions.  Today we will discontinue this medication. Plan: Today we will discontinue the tramadol and we will start a trial of oxycodone IR 5 mg.  Pertinent Labs  Renal Function Lab Results  Component Value Date   BUN 18 11/27/2018   CREATININE 0.99 11/27/2018   GFRAA >60 11/27/2018   GFRNONAA >60 11/27/2018   Hepatic Function Lab Results  Component Value Date   AST 26 11/25/2018   ALT 21 11/25/2018   ALBUMIN 3.1 (L) 11/25/2018   UDS Summary  Date Value Ref Range Status  11/25/2018 FINAL  Final    Comment:    ==================================================================== TOXAS[facility]RE COMP DRUG ANALYSIS,UR ==================================================================== Test                             Result       Flag       Units Drug Present and Declared for Prescription Verification   Acetaminophen                  PRESENT      EXPECTED Drug Present not Declared for Prescription Verification   Alcohol, Ethyl                 0.297        UNEXPECTED g/dL    Sources of ethyl alcohol include alcoholic beverages or as a    fermentation product of glucose; glucose is present in this    specimen.  Interpret result with caution, as the presence of    ethyl alcohol is likely due, at least in part, to fermentation of    glucose.   Cyclobenzaprine                PRESENT      UNEXPECTED   Desmethylcyclobenzaprine        PRESENT      UNEXPECTED    Desmethylcyclobenzaprine is an expected metabolite of    cyclobenzaprine.   Salicylate                     PRESENT      UNEXPECTED   Diphenhydramine                PRESENT      UNEXPECTED Drug Absent but Declared for Prescription Verification   Ibuprofen                      Not Detected UNEXPECTED    Ibuprofen, as indicated in the declared medication list, is not    always  detected even when used as directed.   Metoprolol                     Not Detected UNEXPECTED ==================================================================== Test                      Result    Flag   Units      Ref Range   Creatinine              114              mg/dL      >=20 ==================================================================== Declared Medications:  The flagging and interpretation on this report are based on the  following declared medications.  Unexpected results may arise from  inaccuracies in the declared medications.  **Note: The testing scope of this panel includes these medications:  Metoprolol  **Note: The testing scope of this panel does not include small to  moderate amounts of these reported medications:  Acetaminophen  Ibuprofen  **Note: The testing scope of this panel does not include following  reported medications:  Apixaban  Atorvastatin  Insulin ([place]) ==================================================================== For clinical consultation, please call [phone]. ====================================================================    Note: Above Lab results reviewed.  Recent imaging  DG Chest 2 View CLINICAL DATA:  Cough.  EXAM: CHEST - 2 VIEW  COMPARISON:  Chest x-ray dated August 18, 2018.  FINDINGS: The heart size and mediastinal contours are within normal limits. Normal pulmonary vascularity. Atherosclerotic calcification of the aortic arch. Streaky linear opacities in both lower lobes. No focal consolidation,  pleural effusion, or pneumothorax. No acute osseous abnormality.  IMPRESSION: 1. Bilateral lower lobe streaky linear opacities, favoring atelectasis.  Electronically Signed   By: Titus Dubin M.D.   On: 11/26/2018 16:53  Assessment  The primary encounter diagnosis was Chronic pain syndrome. Diagnoses of Chronic low back pain (Primary area of Pain) (Bilateral) (L>R) w/ sciatica (Bilateral), Chronic lower extremity pain (Secondary Area of Pain) (Bilateral) (L>R), Fibromyalgia syndrome, Chronic musculoskeletal pain, Myofascial pain syndrome, Hepatic cirrhosis, unspecified type ([place]), Hypoalbuminemia, and Edema due to hypoalbuminemia were also pertinent to this visit.  Plan of Care  I have discontinued [name]'s traMADol. I have also changed her cyclobenzaprine. Additionally, I am having her start on oxyCODONE. Lastly, I am having her maintain her Contour Blood Glucose System, Lancet Device, apixaban, atorvastatin, albuterol, metoprolol tartrate, ergocalciferol, Vitamin D3, Magnesium, calcium carbonate, Vitamin B-12, naproxen sodium, glucose blood, and Insulin Detemir.  Pharmacotherapy (Medications Ordered): Meds ordered this encounter  Medications  . cyclobenzaprine (FLEXERIL) 10 MG tablet    Sig: Take 1 tablet (10 mg total) by mouth 3 (three) times daily as needed for muscle spasms. Must last 30 days.    Dispense:  90 tablet    Refill:  2    Fill one day early if pharmacy is closed on scheduled refill date. May substitute for generic if available.  Mar[facility] Kitchen oxyCODONE (OXY IR/ROXICODONE) 5 MG immediate release tablet    Sig: Take 1 tablet (5 mg total) by mouth every 6 (six) hours as needed for up to 30 days for severe pain. Must last 30 days. Max: 4/day    Dispense:  120 tablet    Refill:  0    Chronic Pain: STOP Act - Not applicable. Fill 1 day early if closed on scheduled refill date. Do not fill until: 02/11/2019. To last until: 03/13/2019. Instruct to avoid benzodiazepines within 8  hours of opioid.   Orders:  No orders of the defined types were placed in this encounter.  Follow-up plan:   No follow-ups on file.    I discussed the assessment and treatment plan with the patient. The patient was provided an opportunity to ask questions and all were answered. The patient agreed with the plan and demonstrated an understanding of the instructions.  Patient advised to call back or seek an in-person evaluation if the symptoms or condition worsens.  Total duration of non-face-to-face encounter: 25 minutes.  Note by: Gaspar Cola, MD Date: 02/11/2019; Time: 12:51 PM  Note: This dictation was prepared with Dragon dictation. Any transcriptional errors that may result from this process are unintentional.  Disclaimer:  * Given the special circumstances of the COVID-19 pandemic, the federal government has announced that the Office for Civil Rights (OCR) will exercise its enforcement discretion and will not impose penalties on physicians using telehealth in the event of noncompliance with regulatory requirements under the [place] and [place] (HIPAA) in connection with the good faith provision of telehealth during the JIZXY-81 national public health emergency. ([place])

## 2019-02-12 ENCOUNTER — Encounter: Payer: Self-pay | Admitting: Primary Care

## 2019-02-13 ENCOUNTER — Ambulatory Visit: Payer: Medicare Other | Admitting: Primary Care

## 2019-02-17 ENCOUNTER — Other Ambulatory Visit: Payer: Self-pay | Admitting: Primary Care

## 2019-03-05 ENCOUNTER — Encounter: Payer: Self-pay | Admitting: Pain Medicine

## 2019-03-09 ENCOUNTER — Ambulatory Visit: Payer: Medicare Other | Attending: Pain Medicine | Admitting: Pain Medicine

## 2019-03-09 ENCOUNTER — Telehealth: Payer: Self-pay

## 2019-03-09 ENCOUNTER — Other Ambulatory Visit: Payer: Self-pay

## 2019-03-09 DIAGNOSIS — G894 Chronic pain syndrome: Secondary | ICD-10-CM | POA: Diagnosis not present

## 2019-03-09 DIAGNOSIS — M47816 Spondylosis without myelopathy or radiculopathy, lumbar region: Secondary | ICD-10-CM | POA: Diagnosis not present

## 2019-03-09 DIAGNOSIS — G8929 Other chronic pain: Secondary | ICD-10-CM

## 2019-03-09 DIAGNOSIS — M5441 Lumbago with sciatica, right side: Secondary | ICD-10-CM

## 2019-03-09 DIAGNOSIS — Z7901 Long term (current) use of anticoagulants: Secondary | ICD-10-CM | POA: Insufficient documentation

## 2019-03-09 DIAGNOSIS — M79605 Pain in left leg: Secondary | ICD-10-CM

## 2019-03-09 DIAGNOSIS — M5442 Lumbago with sciatica, left side: Secondary | ICD-10-CM | POA: Diagnosis not present

## 2019-03-09 DIAGNOSIS — M5136 Other intervertebral disc degeneration, lumbar region: Secondary | ICD-10-CM

## 2019-03-09 DIAGNOSIS — M79604 Pain in right leg: Secondary | ICD-10-CM

## 2019-03-09 MED ORDER — OXYCODONE HCL 5 MG PO TABS: 5.0000 mg | ORAL_TABLET | ORAL | 0 refills | Status: DC | PRN

## 2019-03-09 NOTE — Telephone Encounter (Signed)
Dr. Dossie Arbour told her to start taking 6 a day instead of 4 of her oxycodone. The pharmacy has a note stating Dr. Dossie Arbour said do not fill till 7/4. If she starts taking 6 a day like he said she will run out before she is supposed to get her medicine filled. Can someone look into this?

## 2019-03-09 NOTE — Telephone Encounter (Signed)
Patient advised to begin taking 6 pills per day when she fills the next script on 03-13-19.

## 2019-03-09 NOTE — Patient Instructions (Signed)
____________________________________________________________________________________________  Preparing for Procedure with Sedation  Procedure appointments are limited to planned procedures: . No Prescription Refills. . No disability issues will be discussed. . No medication changes will be discussed.  Instructions: . Oral Intake: Do not eat or drink anything for at least 8 hours prior to your procedure. . Transportation: Public transportation is not allowed. Bring an adult driver. The driver must be physically present in our waiting room before any procedure can be started. . Physical Assistance: Bring an adult physically capable of assisting you, in the event you need help. This adult should keep you company at home for at least 6 hours after the procedure. . Blood Pressure Medicine: Take your blood pressure medicine with a sip of water the morning of the procedure. . Blood thinners: Notify our staff if you are taking any blood thinners. Depending on which one you take, there will be specific instructions on how and when to stop it. . Diabetics on insulin: Notify the staff so that you can be scheduled 1st case in the morning. If your diabetes requires high dose insulin, take only  of your normal insulin dose the morning of the procedure and notify the staff that you have done so. . Preventing infections: Shower with an antibacterial soap the morning of your procedure. . Build-up your immune system: Take 1000 mg of Vitamin C with every meal (3 times a day) the day prior to your procedure. . Antibiotics: Inform the staff if you have a condition or reason that requires you to take antibiotics before dental procedures. . Pregnancy: If you are pregnant, call and cancel the procedure. . Sickness: If you have a cold, fever, or any active infections, call and cancel the procedure. . Arrival: You must be in the facility at least 30 minutes prior to your scheduled procedure. . Children: Do not bring  children with you. . Dress appropriately: Bring dark clothing that you would not mind if they get stained. . Valuables: Do not bring any jewelry or valuables.  Reasons to call and reschedule or cancel your procedure: (Following these recommendations will minimize the risk of a serious complication.) . Surgeries: Avoid having procedures within 2 weeks of any surgery. (Avoid for 2 weeks before or after any surgery). . Flu Shots: Avoid having procedures within 2 weeks of a flu shots or . (Avoid for 2 weeks before or after immunizations). . Barium: Avoid having a procedure within 7-10 days after having had a radiological study involving the use of radiological contrast. (Myelograms, Barium swallow or enema study). . Heart attacks: Avoid any elective procedures or surgeries for the initial 6 months after a "Myocardial Infarction" (Heart Attack). . Blood thinners: It is imperative that you stop these medications before procedures. Let us know if you if you take any blood thinner.  . Infection: Avoid procedures during or within two weeks of an infection (including chest colds or gastrointestinal problems). Symptoms associated with infections include: Localized redness, fever, chills, night sweats or profuse sweating, burning sensation when voiding, cough, congestion, stuffiness, runny nose, sore throat, diarrhea, nausea, vomiting, cold or Flu symptoms, recent or current infections. It is specially important if the infection is over the area that we intend to treat. . Heart and lung problems: Symptoms that may suggest an active cardiopulmonary problem include: cough, chest pain, breathing difficulties or shortness of breath, dizziness, ankle swelling, uncontrolled high or unusually low blood pressure, and/or palpitations. If you are experiencing any of these symptoms, cancel your procedure and contact   your primary care physician for an evaluation.  Remember:  Regular Business hours are:  Monday to Thursday  8:00 AM to 4:00 PM  Provider's Schedule: Barrett Holthaus, MD:  Procedure days: Tuesday and Thursday 7:30 AM to 4:00 PM  Bilal Lateef, MD:  Procedure days: Monday and Wednesday 7:30 AM to 4:00 PM ____________________________________________________________________________________________    

## 2019-03-09 NOTE — Progress Notes (Signed)
Pain Management Virtual Encounter Note - Virtual Visit via Telephone Telehealth (real-time audio visits between healthcare provider and patient).   Patient's Phone No. & Preferred Pharmacy:  [phone] (home); There is no such number on file (mobile).; (Preferred) [phone] No e-mail address on record  [place], [place] - [address] [place] [address] Phone: [phone] Fax: [phone]    Pre-screening note:  Our staff contacted [name] and offered her an "in person", "face-to-face" appointment versus a telephone encounter. She indicated preferring the telephone encounter, at this time.   Reason for Virtual Visit: COVID-19*  Social distancing based on CDC and AMA recommendations.   I contacted [name] on 03/09/2019 via telephone.      I clearly identified myself as Gaspar Cola, MD. I verified that I was speaking with the correct person using two identifiers (Name: LAURELLE SKIVER, and date of birth: 10-22-1955).  Advanced Informed Consent I sought verbal advanced consent from [name] for virtual visit interactions. I informed [name] of possible security and privacy concerns, risks, and limitations associated with providing "not-in-person" medical evaluation and management services. I also informed [name] of the availability of "in-person" appointments. Finally, I informed her that there would be a charge for the virtual visit and that she could be  personally, fully or partially, financially responsible for it. Ms. Rosene expressed understanding and agreed to proceed.   Historic Elements   [name] is a 63 y.o. year old, female patient evaluated today after her last encounter by our practice on 02/11/2019. Ms. Trager  has a past medical history of Acute encephalopathy (05/22/2018), Allergy, Anxiety, Ascites, C. difficile colitis (07/10/2015), Cancer ([place]), Cirrhosis of liver not due to alcohol ([place])  (2016), Degenerative disk disease, Diverticulitis, Gastroparesis, GERD (gastroesophageal reflux disease), History of hiatal hernia, Hypertension, Hypothyroid, Hypothyroidism due to amiodarone, Ileus ([place]) (08/01/2015), Intussusception intestine ([place]) (05/2015), Orthostatic hypotension, PAF (paroxysmal atrial fibrillation) ([place]) (03/2015), Pancreatitis, Pneumonia (11/14/2015), Right ureteral stone (07/14/2016), Sick sinus syndrome ([place]), Stomach ulcer, Stroke ([place]), Syncope (01/2015), Syncope due to orthostatic hypotension (05/18/2015), Tachyarrhythmia (01/10/2016), TIA (transient ischemic attack) (02/2015), Type 1 diabetes ([place]), UTERINE CANCER, HX OF (03/27/2007), and UTI (urinary tract infection) (05/22/2018). She also  has a past surgical history that includes Hernia repair; Abdominal hysterectomy; Cholecystectomy; Esophagogastroduodenoscopy (N/A, 04/04/2015); Cardiac catheterization (N/A, 01/12/2016); Esophagogastroduodenoscopy (egd) with propofol (N/A, 01/18/2016); Flexible sigmoidoscopy (N/A, 01/18/2016); Cystoscopy/ureteroscopy/holmium laser (Right, 07/14/2016); and Esophagogastroduodenoscopy (N/A, 12/28/2017). Ms. Starry has a current medication list which includes the following prescription(s): albuterol, apixaban, atorvastatin, contour blood glucose system, calcium carbonate, vitamin d3, vitamin b-12, cyclobenzaprine, ergocalciferol, glucose blood, insulin detemir, lancet device, magnesium, metoprolol tartrate, and oxycodone. She  reports that she has quit smoking. Her smoking use included cigarettes. She has never used smokeless tobacco. She reports that she does not drink alcohol or use drugs. Ms. Eagles is allergic to aspirin; codeine sulfate; erythromycin; prednisone; rosiglitazone maleate; and tetanus-diphtheria toxoids td.   HPI  Today, she is being contacted for medication management.  The patient indicates that she did not have any problems with the use of the oxycodone except that 5 mg only will last for about 2  hours and then her pain goes back to how it was.  In talking to the patient it was clear that she has been on opioids for a long time and it is very likely that were dealing with an issue of tolerance.  For the time being,  we will increase her oxycodone to every 4 hours but with the goal of eventually doing a "drug holiday" and completely stopping the medicine for at least 14 consecutive days in order to wipe out the patient has tolerance to it.  In order to be able to do this without causing any suffering, we will go ahead and schedule the patient to come in for a diagnostic bilateral lumbar facet block under fluoroscopic guidance and IV sedation.  The patient describes her pain as being primarily in the area of the lower back, bilaterally, with the right side being worse than the left.  The pain also goes down both legs to the level of the knee through the posterior aspect of the leg, which is the usual referred pain pattern of the facet joint.  Today I asked the patient to hyperextend and rotate which aggravated the patient's pain.  The patient does have a history of degenerative disc disease and levoscoliosis, both of which are associated with bilateral facet arthropathy.  The plan is to do a diagnostic lumbar facet block and if this proves to be effective, then we will consider radiofrequency ablation as a long-term management treatment for her low back pain.  Risks that are unique to this patient include the fact that she is on anticoagulation using Eliquis and she also has type 1 diabetes.  She denies ever having been hospitalized due to diabetic ketoacidosis or diabetic coma.  Today the patient was instructed to stop the Eliquis for 3 days prior to the procedure.  The patient was also informed that we will be testing her for COVID before coming in.  She was already tested recently and found to be negative.  Due to the fact that the patient is on Eliquis anticoagulation I have instructed her to stop  taking the naproxen since she had complained of epigastric pain when taking this medicine.  Being on a blood thinner she is at risk of a serious GI bleed.  Patient today was also given information as to what to look for in the event that she was to have some bleeding.  She was also instructed to go immediately to the emergency room should she find herself having black stools or melena.  Pharmacotherapy Assessment  Analgesic: Oxycodone IR 5 mg 1 tablet p.o. every 6 hours.  Today we will decrease the time.  Between doses to 4 hours. MME/day: 30 mg/day.  Previously the patient was using an average of 40 mg/day and with today's increase we will go up to an MME of 45.  Monitoring: Pharmacotherapy: No side-effects or adverse reactions reported. [place] PMP: PDMP reviewed during this encounter.       Compliance: No problems identified. Effectiveness: Clinically acceptable. Plan: Refer to "POC".  Pertinent Labs   SAFETY SCREENING Profile Lab Results  Component Value Date   SARSCOV2NAA Not Detected 02/10/2019   MRSAPCR NEGATIVE 05/22/2018   HCVAB 0.2 09/25/2018   HIV Non Reactive 12/28/2017   Renal Function Lab Results  Component Value Date   BUN 18 11/27/2018   CREATININE 0.99 11/27/2018   GFRAA >60 11/27/2018   GFRNONAA >60 11/27/2018   Hepatic Function Lab Results  Component Value Date   AST 26 11/25/2018   ALT 21 11/25/2018   ALBUMIN 3.1 (L) 11/25/2018   UDS Summary  Date Value Ref Range Status  11/25/2018 FINAL  Final    Comment:    ==================================================================== TOXASSURE COMP DRUG ANALYSIS,UR ==================================================================== Test  Result       Flag       Units Drug Present and Declared for Prescription Verification   Acetaminophen                  PRESENT      EXPECTED Drug Present not Declared for Prescription Verification   Alcohol, Ethyl                 0.297         UNEXPECTED g/dL    Sources of ethyl alcohol include alcoholic beverages or as a    fermentation product of glucose; glucose is present in this    specimen.  Interpret result with caution, as the presence of    ethyl alcohol is likely due, at least in part, to fermentation of    glucose.   Cyclobenzaprine                PRESENT      UNEXPECTED   Desmethylcyclobenzaprine       PRESENT      UNEXPECTED    Desmethylcyclobenzaprine is an expected metabolite of    cyclobenzaprine.   Salicylate                     PRESENT      UNEXPECTED   Diphenhydramine                PRESENT      UNEXPECTED Drug Absent but Declared for Prescription Verification   Ibuprofen                      Not Detected UNEXPECTED    Ibuprofen, as indicated in the declared medication list, is not    always detected even when used as directed.   Metoprolol                     Not Detected UNEXPECTED ==================================================================== Test                      Result    Flag   Units      Ref Range   Creatinine              114              mg/dL      >=20 ==================================================================== Declared Medications:  The flagging and interpretation on this report are based on the  following declared medications.  Unexpected results may arise from  inaccuracies in the declared medications.  **Note: The testing scope of this panel includes these medications:  Metoprolol  **Note: The testing scope of this panel does not include small to  moderate amounts of these reported medications:  Acetaminophen  Ibuprofen  **Note: The testing scope of this panel does not include following  reported medications:  Apixaban  Atorvastatin  Insulin (Lantus) ==================================================================== For clinical consultation, please call [phone]. ====================================================================    Note: Above Lab  results reviewed.  Recent imaging  DG Chest 2 View CLINICAL DATA:  Cough.  EXAM: CHEST - 2 VIEW  COMPARISON:  Chest x-ray dated August 18, 2018.  FINDINGS: The heart size and mediastinal contours are within normal limits. Normal pulmonary vascularity. Atherosclerotic calcification of the aortic arch. Streaky linear opacities in both lower lobes. No focal consolidation, pleural effusion, or pneumothorax. No acute osseous abnormality.  IMPRESSION: 1. Bilateral lower lobe streaky linear opacities, favoring atelectasis.  Electronically Signed  By: Titus Dubin M.D.   On: 11/26/2018 16:53  Assessment  The primary encounter diagnosis was Chronic pain syndrome. Diagnoses of Chronic low back pain (Primary area of Pain) (Bilateral) (L>R) w/ sciatica (Bilateral), Chronic lower extremity pain (Secondary Area of Pain) (Bilateral) (L>R), Lumbar facet syndrome (Bilateral) (R>L), DDD (degenerative disc disease), lumbar, and Long term current use of anticoagulant therapy (Eliquis) were also pertinent to this visit.  Plan of Care  I have discontinued [name] A. Walle's naproxen sodium. I have also changed her oxyCODONE. Additionally, I am having her maintain her Contour Blood Glucose System, Lancet Device, apixaban, albuterol, metoprolol tartrate, ergocalciferol, Vitamin D3, Magnesium, calcium carbonate, Vitamin B-12, glucose blood, Insulin Detemir, cyclobenzaprine, and atorvastatin.  Pharmacotherapy (Medications Ordered): Meds ordered this encounter  Medications  . oxyCODONE (OXY IR/ROXICODONE) 5 MG immediate release tablet    Sig: Take 1 tablet (5 mg total) by mouth every 4 (four) hours as needed for up to 30 days for severe pain. Must last 30 days. Max: 6/day    Dispense:  180 tablet    Refill:  0    Chronic Pain: STOP Act (Not applicable) Fill 1 day early if closed on refill date. Do not fill until: 03/13/2019. To last until: 04/12/2019. Avoid benzodiazepines within 8 hours of opioids    Orders:  Orders Placed This Encounter  Procedures  . LUMBAR FACET(MEDIAL BRANCH NERVE BLOCK) MBNB    Standing Status:   Future    Standing Expiration Date:   04/08/2019    Scheduling Instructions:     Side: Bilateral     Level: L3-4, L4-5, & L5-S1 Facets (L2, L3, L4, L5, & S1 Medial Branch Nerves)     Sedation: Patient's choice.     Timeframe: ASAA    Order Specific Question:   Where will this procedure be performed?    Answer:   [facility]  . Blood Thinner Instructions to Nursing    If the patient requires a Lovenox-bridge therapy, make sure arrangements are made to institute it with the assistance of the PCP.    Standing Status:   Standing    Number of Occurrences:   36    Standing Expiration Date:   09/07/2020    Scheduling Instructions:     Always stop the Eliquis (Apixaban) x 3 days prior to procedure or surgery.   Follow-up plan:   Return for Procedure (w/ sedation): (B) L-FCT Blk #1, (Blood-thinner Protocol).  In addition to this, today I have increased her oxycodone IR 5 mg from every 6 hours to every 4 hours.  The long-term plan he is to do a "Drug Holidays", as soon as I can get her low back pain under control.  The patient does have enough Flexeril so that she will not need a refill today.   Recent Visits Date Type Provider Dept  02/11/19 Office Visit Milinda Pointer, MD [facility]  01/14/19 Office Visit Milinda Pointer, MD [facility]  12/17/18 Office Visit Milinda Pointer, MD [facility]  Showing recent visits within past 90 days and meeting all other requirements   Today's Visits Date Type Provider Dept  03/09/19 Office Visit Milinda Pointer, MD [facility]  Showing today's visits and meeting all other requirements   Future Appointments No visits were found meeting these conditions.  Showing future appointments within next 90 days and meeting all other requirements   I discussed the assessment and  treatment plan with the patient. The patient was provided an  opportunity to ask questions and all were answered. The patient agreed with the plan and demonstrated an understanding of the instructions.  Patient advised to call back or seek an in-person evaluation if the symptoms or condition worsens.  Total duration of non-face-to-face encounter: 22 minutes.  Note by: Gaspar Cola, MD Date: 03/09/2019; Time: 12:40 PM  Note: This dictation was prepared with Dragon dictation. Any transcriptional errors that may result from this process are unintentional.  Disclaimer:  * Given the special circumstances of the COVID-19 pandemic, the federal government has announced that the Office for Civil Rights (OCR) will exercise its enforcement discretion and will not impose penalties on physicians using telehealth in the event of noncompliance with regulatory requirements under the [place] (HIPAA) in connection with the good faith provision of telehealth during the UVJDY-51 national public health emergency. (Welcome)

## 2019-03-16 ENCOUNTER — Other Ambulatory Visit: Admission: RE | Admit: 2019-03-16 | Payer: Medicare Other | Source: Ambulatory Visit

## 2019-03-16 ENCOUNTER — Telehealth: Payer: Self-pay | Admitting: Pain Medicine

## 2019-03-16 NOTE — Telephone Encounter (Signed)
Pt called stating she has no transportation to be able to get her COVID test done or her procedure that was scheduled for 7/9 and wanted me to let Dr. Delane Ginger know that the pain medication is working and wants to put the procedure off for a month or 2.

## 2019-03-19 ENCOUNTER — Ambulatory Visit: Payer: Medicare Other | Admitting: Pain Medicine

## 2019-03-25 ENCOUNTER — Telehealth: Payer: Self-pay | Admitting: Primary Care

## 2019-03-25 NOTE — Telephone Encounter (Signed)
Noted. We will continue to manage her for now.

## 2019-03-25 NOTE — Telephone Encounter (Signed)
Received message that patient canceled the Endocrinology appointment with Dr Gabriel Carina and told them her labs were better and she didn't need to see Endocrinology. They cancelled the Referral and she will need a New Referral if we send her back to [facility].

## 2019-04-06 ENCOUNTER — Telehealth: Payer: Self-pay

## 2019-04-06 NOTE — Telephone Encounter (Signed)
Spoken and notified patient of [name]'s comments. Patient verbalized understanding.  

## 2019-04-06 NOTE — Telephone Encounter (Signed)
Pt said that she spoke with Dr Milinda Pointer office today and Dr Lowella Dandy is out of office until 04/13/19. Pt cannot get virtual appt with Dr Lowella Dandy until 04/15/19 in afternoon. Pt asked if another provider at Dr Dossie Arbour office could give pt at least 3 days of oxycodone 5 mg until can have virtual appt on 04/15/19 and pt will be out of med on 04/13/19. Pt was advised no. Pt wants to know if [name] NP can give pt 3 days of oxycodone 5 mg until has virtual visit with Dr Dossie Arbour because pt said she cannot do without pain med for 3 days. I advised since controlled substance [name] may not be able to fill but pt request note to [name] NP and request cb after she has reviewed this note. Pt last seen [facility]/09/19 [facility].

## 2019-04-06 NOTE — Telephone Encounter (Signed)
Please notify patient that I cannot fill her oxycodone, she will need to wait for her visit with pain management.  I recommend she use her remaining medication sparingly until evaluated.

## 2019-04-10 ENCOUNTER — Other Ambulatory Visit: Payer: Self-pay

## 2019-04-10 ENCOUNTER — Encounter: Payer: Self-pay | Admitting: Family Medicine

## 2019-04-10 ENCOUNTER — Ambulatory Visit (INDEPENDENT_AMBULATORY_CARE_PROVIDER_SITE_OTHER): Payer: Medicare Other | Admitting: Family Medicine

## 2019-04-10 ENCOUNTER — Ambulatory Visit (INDEPENDENT_AMBULATORY_CARE_PROVIDER_SITE_OTHER)
Admission: RE | Admit: 2019-04-10 | Discharge: 2019-04-10 | Disposition: A | Discharge status: Home / Self Care | Payer: Medicare Other | Source: Ambulatory Visit | Attending: Family Medicine | Admitting: Family Medicine

## 2019-04-10 VITALS — BP 140/80 | HR 122 | Temp 98.2°F | Ht 63.0 in | Wt 155.8 lb

## 2019-04-10 DIAGNOSIS — S8265XA Nondisplaced fracture of lateral malleolus of left fibula, initial encounter for closed fracture: Secondary | ICD-10-CM | POA: Diagnosis not present

## 2019-04-10 DIAGNOSIS — I2510 Atherosclerotic heart disease of native coronary artery without angina pectoris: Secondary | ICD-10-CM | POA: Insufficient documentation

## 2019-04-10 DIAGNOSIS — W19XXXA Unspecified fall, initial encounter: Secondary | ICD-10-CM

## 2019-04-10 DIAGNOSIS — I480 Paroxysmal atrial fibrillation: Secondary | ICD-10-CM

## 2019-04-10 DIAGNOSIS — M25572 Pain in left ankle and joints of left foot: Secondary | ICD-10-CM

## 2019-04-10 DIAGNOSIS — S8292XA Unspecified fracture of left lower leg, initial encounter for closed fracture: Secondary | ICD-10-CM | POA: Diagnosis not present

## 2019-04-10 DIAGNOSIS — S8263XA Displaced fracture of lateral malleolus of unspecified fibula, initial encounter for closed fracture: Secondary | ICD-10-CM | POA: Insufficient documentation

## 2019-04-10 DIAGNOSIS — S82832A Other fracture of upper and lower end of left fibula, initial encounter for closed fracture: Secondary | ICD-10-CM

## 2019-04-10 DIAGNOSIS — I420 Dilated cardiomyopathy: Secondary | ICD-10-CM

## 2019-04-10 DIAGNOSIS — I495 Sick sinus syndrome: Secondary | ICD-10-CM

## 2019-04-10 DIAGNOSIS — R55 Syncope and collapse: Secondary | ICD-10-CM | POA: Diagnosis not present

## 2019-04-10 LAB — CBC WITH DIFFERENTIAL/PLATELET
Basophils Absolute: 0 10*3/uL (ref 0.0–0.1)
Basophils Relative: 0.6 % (ref 0.0–3.0)
Eosinophils Absolute: 0.1 10*3/uL (ref 0.0–0.7)
Eosinophils Relative: 2.8 % (ref 0.0–5.0)
HCT: 32.7 % — ABNORMAL LOW (ref 36.0–46.0)
Hemoglobin: 10.2 g/dL — ABNORMAL LOW (ref 12.0–15.0)
Lymphocytes Relative: 21.5 % (ref 12.0–46.0)
Lymphs Abs: 1.1 10*3/uL (ref 0.7–4.0)
MCHC: 31.1 g/dL (ref 30.0–36.0)
MCV: 80.3 fl (ref 78.0–100.0)
Monocytes Absolute: 0.3 10*3/uL (ref 0.1–1.0)
Monocytes Relative: 6.9 % (ref 3.0–12.0)
Neutro Abs: 3.4 10*3/uL (ref 1.4–7.7)
Neutrophils Relative %: 68.2 % (ref 43.0–77.0)
Platelets: 188 10*3/uL (ref 150.0–400.0)
RBC: 4.08 Mil/uL (ref 3.87–5.11)
RDW: 17.8 % — ABNORMAL HIGH (ref 11.5–15.5)
WBC: 5 10*3/uL (ref 4.0–10.5)

## 2019-04-10 LAB — COMPREHENSIVE METABOLIC PANEL
ALT: 27 U/L (ref 0–35)
AST: 25 U/L (ref 0–37)
Albumin: 3.9 g/dL (ref 3.5–5.2)
Alkaline Phosphatase: 180 U/L — ABNORMAL HIGH (ref 39–117)
BUN: 17 mg/dL (ref 6–23)
CO2: 19 mEq/L (ref 19–32)
Calcium: 9.6 mg/dL (ref 8.4–10.5)
Chloride: 100 mEq/L (ref 96–112)
Creatinine, Ser: 1.74 mg/dL — ABNORMAL HIGH (ref 0.40–1.20)
GFR: 29.34 mL/min — ABNORMAL LOW (ref >60.00)
Glucose, Bld: 422 mg/dL — ABNORMAL HIGH (ref 70–99)
Potassium: 4 mEq/L (ref 3.5–5.1)
Sodium: 128 mEq/L — ABNORMAL LOW (ref 135–145)
Total Bilirubin: 0.5 mg/dL (ref 0.2–1.2)
Total Protein: 8.3 g/dL (ref 6.0–8.3)

## 2019-04-10 NOTE — Patient Instructions (Addendum)
We will get you set up with Orthopedic MD today.  Elevate, ice ankle, no weight bearing until seen.  We will call with lab results. We also need to get you set up with cardiology to follow up on passing out and other heart issues.

## 2019-04-10 NOTE — Progress Notes (Signed)
Chief Complaint  Patient presents with  . Ankle Injury    Left    History of Present Illness: HPI   63 year old female pt of [name]'s with complicated medical history  ( including bradycardia, paroxsysmal afib, tachybrady syndrome,, prolongued QT anemia ( on Eliquis, metoprolol), CAD and DM)presents with new onset left ankle injury.   She reports 2-3 weeks ago she  had a syncopal spell... LOC.Marland Kitchen did not go to ER or MD. Occurred 2:30 in AM No symptoms prior. No CP, no palpations, no dizziness.She has had similar episodes in the past. Sudden onset .Marland Kitchen out of the blue.  BS was 220 that night,  Did not check BP or HR.  Husband came when she called.. she was mildly confused, no neuro changes. No seizure activity.   Has noted in last 1-2 months.. fast heartbeat at times, pounding.  No CP, no SOB.  Not eating well, but drinking liquids. Fatigued   She thinks she fell and may have sprained her ankle. Since then, increasing pain in anterior lateral ankle as well as front of calf up to knee. No other bruises or injuries. May have hit head.  ( no HA pr sore area though)  Swelling in lateral ankle.  Able to put only some weight on it.  Using cane to get around.  Not sure how she injured it/twisted it.   She has chronic pain on oxycodone followed by Dr. Lowella Dandy. She hd fall in 08/2018.Marland Kitchen went to ER: dx with orthostaitc hypotension Appears she was referred to cardiology in 12/2018.Marland Kitchen but  [facility] cardiology office was unable to reach to set it up.   COVID 19 screen No recent travel or known exposure to COVID19 The patient denies respiratory symptoms of COVID 19 at this time.  The importance of social distancing was discussed today.   Patient Care Team: [name], NP as PCP - General (Internal Medicine) Baxter Hire, MD as Attending Physician (Internal Medicine)   Review of Systems  Constitutional: Negative for chills and fever.  HENT: Negative for congestion and ear pain.    Eyes: Negative for pain and redness.  Respiratory: Negative for cough and shortness of breath.   Cardiovascular: Positive for palpitations. Negative for chest pain and leg swelling.  Gastrointestinal: Negative for abdominal pain, blood in stool, constipation, diarrhea, nausea and vomiting.  Genitourinary: Negative for dysuria.  Musculoskeletal: Negative for falls and myalgias.  Skin: Negative for rash.  Neurological: Negative for dizziness.  Psychiatric/Behavioral: Negative for depression. The patient is not nervous/anxious.       Past Medical History:  Diagnosis Date  . Acute encephalopathy 05/22/2018  . Allergy   . Anxiety   . Ascites   . C. difficile colitis 07/10/2015  . Cancer (HCC)    HX OF CANCER OF UTERUS   . Cirrhosis of liver not due to alcohol ([place]) 2016  . Degenerative disk disease   . Diverticulitis   . Gastroparesis   . GERD (gastroesophageal reflux disease)   . History of hiatal hernia   . Hypertension   . Hypothyroid   . Hypothyroidism due to amiodarone   . Ileus ([place]) 08/01/2015  . Intussusception intestine ([place]) 05/2015  . Orthostatic hypotension   . PAF (paroxysmal atrial fibrillation) ([place]) 03/2015   a. new onset 03/2015 in setting of intractable N/V; b. on Eliquis 5 mg bid; c. CHADSVASc 4 (DM, TIA x 2, female)  . Pancreatitis   . Pneumonia 11/14/2015  . Right ureteral stone  07/14/2016  . Sick sinus syndrome ([place])   . Stomach ulcer   . Stroke [facility])    with minimal left sided weakness  . Syncope 01/2015  . Syncope due to orthostatic hypotension 05/18/2015  . Tachyarrhythmia 01/10/2016  . TIA (transient ischemic attack) 02/2015  . Type 1 diabetes ([place])    on levemir  . UTERINE CANCER, HX OF 03/27/2007   Qualifier: Diagnosis of  By: Maxie Better FNP, Rosalita Levan   . UTI (urinary tract infection) 05/22/2018    reports that she has quit smoking. Her smoking use included cigarettes. She has never used smokeless tobacco. She reports that she does not drink  alcohol or use drugs.   Current Outpatient Medications:  .  albuterol (PROVENTIL HFA;VENTOLIN HFA) 108 (90 Base) MCG/ACT inhaler, Inhale 2 puffs into the lungs every 6 (six) hours as needed for wheezing or shortness of breath., Disp: 1 Inhaler, Rfl: 2 .  apixaban (ELIQUIS) 5 MG TABS tablet, Take 5 mg by mouth 2 (two) times daily., Disp: , Rfl:  .  atorvastatin (LIPITOR) 40 MG tablet, TAKE ONE TABLET BY MOUTH EVERY DAY FOR CHOLESTEROL, Disp: 90 tablet, Rfl: 1 .  Blood Glucose Monitoring Suppl (CONTOUR BLOOD GLUCOSE SYSTEM) w/Device KIT, Use to test blood sugar 3 times daily. Dx is E11.65, Disp: 1 each, Rfl: 0 .  calcium carbonate (CALCIUM 600) 600 MG TABS tablet, Take 1 tablet (600 mg total) by mouth 2 (two) times daily with a meal., Disp: 60 tablet, Rfl: 5 .  Cholecalciferol (VITAMIN D3) 125 MCG (5000 UT) CAPS, Take 1 capsule (5,000 Units total) by mouth daily with breakfast. Take along with calcium and magnesium., Disp: 180 capsule, Rfl: 0 .  Cyanocobalamin (VITAMIN B-12) 5000 MCG SUBL, Place 1 tablet (5,000 mcg total) under the tongue daily., Disp: 30 tablet, Rfl: 5 .  cyclobenzaprine (FLEXERIL) 10 MG tablet, Take 1 tablet (10 mg total) by mouth 3 (three) times daily as needed for muscle spasms. Must last 30 days., Disp: 90 tablet, Rfl: 2 .  glucose blood (ACCU-CHEK AVIVA PLUS) test strip, Use as instructed to test blood sugar 3 times daily, Disp: 100 each, Rfl: 5 .  Insulin Detemir (LEVEMIR) 100 UNIT/ML Pen, Inject 30 units in the morning and 30 units at bedtime., Disp: 15 mL, Rfl: 5 .  Insulin Pen Needle (PEN NEEDLES) 31G X 6 MM MISC, , Disp: , Rfl:  .  Lancet Device MISC, Use as instructed to test blood sugar 3 times daily. Dx is E11.65, Disp: 100 each, Rfl: 2 .  Magnesium 500 MG CAPS, Take 1 capsule (500 mg total) by mouth 2 (two) times daily at 8 am and 10 pm., Disp: 60 capsule, Rfl: 5 .  oxyCODONE (OXY IR/ROXICODONE) 5 MG immediate release tablet, Take 1 tablet (5 mg total) by mouth every 4  (four) hours as needed for up to 30 days for severe pain. Must last 30 days. Max: 6/day, Disp: 180 tablet, Rfl: 0 .  ergocalciferol (VITAMIN D2) 1.25 MG (50000 UT) capsule, Take 1 capsule (50,000 Units total) by mouth 2 (two) times a week. X 6 weeks., Disp: 12 capsule, Rfl: 1 .  metoprolol tartrate (LOPRESSOR) 25 MG tablet, Take 1 tablet (25 mg total) by mouth 2 (two) times daily. For heart rate., Disp: 180 tablet, Rfl: 3   Observations/Objective: Blood pressure 140/80, pulse (!) 122, temperature 98.2 F (36.8 C), temperature source Temporal, height 5' 3"  (1.6 m), weight 155 lb 12 oz (70.6 kg), SpO2 100 %.  Physical Exam  Constitutional:      General: She is not in acute distress.    Appearance: Normal appearance. She is well-developed. She is not ill-appearing or toxic-appearing.  HENT:     Head: Normocephalic.     Right Ear: Hearing, tympanic membrane, ear canal and external ear normal. Tympanic membrane is not erythematous, retracted or bulging.     Left Ear: Hearing, tympanic membrane, ear canal and external ear normal. Tympanic membrane is not erythematous, retracted or bulging.     Nose: No mucosal edema or rhinorrhea.     Right Sinus: No maxillary sinus tenderness or frontal sinus tenderness.     Left Sinus: No maxillary sinus tenderness or frontal sinus tenderness.     Mouth/Throat:     Pharynx: Uvula midline.  Eyes:     General: Lids are normal. Lids are everted, no foreign bodies appreciated.     Conjunctiva/sclera: Conjunctivae normal.     Pupils: Pupils are equal, round, and reactive to light.  Neck:     Musculoskeletal: Normal range of motion and neck supple.     Thyroid: No thyroid mass or thyromegaly.     Vascular: No carotid bruit.     Trachea: Trachea normal.  Cardiovascular:     Rate and Rhythm: Normal rate and regular rhythm.     Pulses: Normal pulses.     Heart sounds: Normal heart sounds, S1 normal and S2 normal. No murmur. No friction rub. No gallop.    Pulmonary:     Effort: Pulmonary effort is normal. No tachypnea or respiratory distress.     Breath sounds: Normal breath sounds. No decreased breath sounds, wheezing, rhonchi or rales.  Abdominal:     General: Bowel sounds are normal.     Palpations: Abdomen is soft.     Tenderness: There is no abdominal tenderness.  Musculoskeletal:     Left ankle: She exhibits decreased range of motion, swelling and ecchymosis. Tenderness. Lateral malleolus tenderness found. No medial malleolus, no AITFL, no CF ligament, no posterior TFL and no head of 5th metatarsal tenderness found.  Skin:    General: Skin is warm and dry.     Findings: No rash.  Neurological:     Mental Status: She is alert and oriented to person, place, and time.     GCS: GCS eye subscore is 4. GCS verbal subscore is 5. GCS motor subscore is 6.     Cranial Nerves: No cranial nerve deficit.     Sensory: No sensory deficit.     Motor: No abnormal muscle tone.     Coordination: Coordination normal.     Gait: Gait normal.     Deep Tendon Reflexes: Reflexes are normal and symmetric.     Comments: Nml cerebellar exam   No papilledema  Psychiatric:        Mood and Affect: Mood is not anxious or depressed.        Speech: Speech normal.        Behavior: Behavior normal. Behavior is cooperative.        Thought Content: Thought content normal.        Cognition and Memory: Memory is not impaired. She does not exhibit impaired recent memory or impaired remote memory.        Judgment: Judgment normal.      Assessment and Plan   Closed fracture of left distal fibula Urgent referral Orthopedic MD today.  Elevate, ice ankle, no weight bearing until seen.  Syncope Likely multifactorial cause. Pt  with very complicated PMH.  eval with labs and follow up with cardiology closely.  Congestive dilated cardiomyopathy (HCC) Euvolemic     Eliezer Lofts, MD

## 2019-04-14 ENCOUNTER — Encounter: Payer: Self-pay | Admitting: Pain Medicine

## 2019-04-14 DIAGNOSIS — I420 Dilated cardiomyopathy: Secondary | ICD-10-CM | POA: Insufficient documentation

## 2019-04-14 NOTE — Progress Notes (Signed)
Pain Management Virtual Encounter Note - Virtual Visit via Telephone Telehealth (real-time audio visits between healthcare provider and patient).   Patient's Phone No. & Preferred Pharmacy:  [phone] (home); There is no such number on file (mobile).; (Preferred) [phone] No e-mail address on record  [place], [place] [address] [place] [address] Phone: [phone] Fax: [phone]    Pre-screening note:  Our staff contacted Ms. Oliger and offered her an "in person", "face-to-face" appointment versus a telephone encounter. She indicated preferring the telephone encounter, at this time.   Reason for Virtual Visit: COVID-19*  Social distancing based on CDC and AMA recommendations.   I contacted [name] on 04/15/2019 via telephone.      I clearly identified myself as Gaspar Cola, MD. I verified that I was speaking with the correct person using two identifiers (Name: [name], and date of birth: 08-19-1956).  Advanced Informed Consent I sought verbal advanced consent from [name] for virtual visit interactions. I informed [name] of possible security and privacy concerns, risks, and limitations associated with providing "not-in-person" medical evaluation and management services. I also informed [name] of the availability of "in-person" appointments. Finally, I informed her that there would be a charge for the virtual visit and that she could be  personally, fully or partially, financially responsible for it. [name] expressed understanding and agreed to proceed.   Historic Elements   [name] is a 63 y.o. year old, female patient evaluated today after her last encounter by our practice on 03/16/2019. [name]  has a past medical history of Acute encephalopathy (05/22/2018), Allergy, Anxiety, Ascites, C. difficile colitis (07/10/2015), Cancer ([place]), Cirrhosis of liver not due to alcohol ([place])  (2016), Degenerative disk disease, Diverticulitis, Gastroparesis, GERD (gastroesophageal reflux disease), History of hiatal hernia, Hypertension, Hypothyroid, Hypothyroidism due to amiodarone, Ileus ([place]) (08/01/2015), Intussusception intestine ([place]) (05/2015), Orthostatic hypotension, PAF (paroxysmal atrial fibrillation) ([place]) (03/2015), Pancreatitis, Pneumonia (11/14/2015), Right ureteral stone (07/14/2016), Sick sinus syndrome ([place]), Stomach ulcer, Stroke ([place]), Syncope (01/2015), Syncope due to orthostatic hypotension (05/18/2015), Tachyarrhythmia (01/10/2016), TIA (transient ischemic attack) (02/2015), Type 1 diabetes ([place]), UTERINE CANCER, HX OF (03/27/2007), and UTI (urinary tract infection) (05/22/2018). She also  has a past surgical history that includes Hernia repair; Abdominal hysterectomy; Cholecystectomy; Esophagogastroduodenoscopy (N/A, 04/04/2015); Cardiac catheterization (N/A, 01/12/2016); Esophagogastroduodenoscopy (egd) with propofol (N/A, 01/18/2016); Flexible sigmoidoscopy (N/A, 01/18/2016); Cystoscopy/ureteroscopy/holmium laser (Right, 07/14/2016); and Esophagogastroduodenoscopy (N/A, 12/28/2017). [name] has a current medication list which includes the following prescription(s): albuterol, apixaban, atorvastatin, contour blood glucose system, calcium carbonate, vitamin d3, vitamin b-12, cyclobenzaprine, glucose blood, insulin detemir, pen needles, lancet device, magnesium, metoprolol tartrate, oxycodone, oxycodone, oxycodone, and ergocalciferol. She  reports that she has quit smoking. Her smoking use included cigarettes. She has never used smokeless tobacco. She reports that she does not drink alcohol or use drugs. Ms. Klapper is allergic to aspirin; codeine sulfate; erythromycin; prednisone; rosiglitazone maleate; and tetanus-diphtheria toxoids td.   HPI  Today, she is being contacted for medication management.  Pharmacotherapy Assessment  Analgesic: Oxycodone IR 5 mg, 1 tab PO q 4 hrs (30 mg/day of  oxycodone) MME/day: 45 mg/day.   Monitoring: Pharmacotherapy: No side-effects or adverse reactions reported. [place] PMP: PDMP reviewed during this encounter.       Compliance: No problems identified. Effectiveness: Clinically acceptable. Plan: Refer to "POC".  UDS:  Summary  Date Value Ref Range Status  11/25/2018 FINAL  Final  Comment:    ==================================================================== TOXASSURE COMP DRUG ANALYSIS,UR ==================================================================== Test                             Result       Flag       Units Drug Present and Declared for Prescription Verification   Acetaminophen                  PRESENT      EXPECTED Drug Present not Declared for Prescription Verification   Alcohol, Ethyl                 0.297        UNEXPECTED g/dL    Sources of ethyl alcohol include alcoholic beverages or as a    fermentation product of glucose; glucose is present in this    specimen.  Interpret result with caution, as the presence of    ethyl alcohol is likely due, at least in part, to fermentation of    glucose.   Cyclobenzaprine                PRESENT      UNEXPECTED   Desmethylcyclobenzaprine       PRESENT      UNEXPECTED    Desmethylcyclobenzaprine is an expected metabolite of    cyclobenzaprine.   Salicylate                     PRESENT      UNEXPECTED   Diphenhydramine                PRESENT      UNEXPECTED Drug Absent but Declared for Prescription Verification   Ibuprofen                      Not Detected UNEXPECTED    Ibuprofen, as indicated in the declared medication list, is not    always detected even when used as directed.   Metoprolol                     Not Detected UNEXPECTED ==================================================================== Test                      Result    Flag   Units      Ref Range   Creatinine              114              mg/dL       >=20 ==================================================================== Declared Medications:  The flagging and interpretation on this report are based on the  following declared medications.  Unexpected results may arise from  inaccuracies in the declared medications.  **Note: The testing scope of this panel includes these medications:  Metoprolol  **Note: The testing scope of this panel does not include small to  moderate amounts of these reported medications:  Acetaminophen  Ibuprofen  **Note: The testing scope of this panel does not include following  reported medications:  Apixaban  Atorvastatin  Insulin (Lantus) ==================================================================== For clinical consultation, please call [phone]. ====================================================================    Laboratory Chemistry Profile (12 mo)  Renal: 11/27/2018: GFR calc Af Amer >60; GFR calc non Af Amer >60 04/10/2019: BUN 17; Creatinine, Ser 1.74  Hepatic: 04/10/2019: Albumin 3.9; ALT 27; AST 25 Other: 11/25/2018: CRP 10.8; Sed Rate 79; Vit D, 25-Hydroxy 4.7; Vitamin B-12 546  Note: Above Lab results reviewed.  Imaging  Last 90 days:  Dg Tibia/fibula Left  Result Date: 04/10/2019 CLINICAL DATA:  Fall several weeks ago with persistent left leg pain, initial encounter EXAM: LEFT TIBIA AND FIBULA - 2 VIEW COMPARISON:  None. FINDINGS: Undisplaced distal fibular fracture is noted with associated soft tissue swelling. This is better visualized on the dedicated ankle films. IMPRESSION: Distal fibular fracture without significant displacement. Soft tissue swelling is noted. Electronically Signed   By: Inez Catalina M.D.   On: 04/10/2019 11:45   Dg Ankle Complete Left  Result Date: 04/10/2019 CLINICAL DATA:  Fall several weeks ago with persistent ankle pain, initial encounter EXAM: LEFT ANKLE COMPLETE - 3+ VIEW COMPARISON:  None. FINDINGS: Minimally displaced distal fibular fracture is  noted with associated soft tissue swelling. No other fracture is seen. IMPRESSION: Minimally displaced distal fibular fracture Electronically Signed   By: Inez Catalina M.D.   On: 04/10/2019 11:45   Assessment  The primary encounter diagnosis was Chronic pain syndrome. Diagnoses of Chronic low back pain (Primary area of Pain) (Bilateral) (L>R) w/ sciatica (Bilateral), Chronic lower extremity pain (Secondary Area of Pain) (Bilateral) (L>R), Chronic generalized pain, Chronic musculoskeletal pain, Myofascial pain syndrome, and Fibromyalgia syndrome were also pertinent to this visit.  Plan of Care  I have changed [name]'s oxyCODONE. I am also having her start on oxyCODONE and oxyCODONE. Additionally, I am having her maintain her Contour Blood Glucose System, Lancet Device, apixaban, albuterol, metoprolol tartrate, ergocalciferol, Vitamin D3, Magnesium, calcium carbonate, Vitamin B-12, glucose blood, Insulin Detemir, atorvastatin, Pen Needles, and cyclobenzaprine.  Pharmacotherapy (Medications Ordered): Meds ordered this encounter  Medications  . oxyCODONE (OXY IR/ROXICODONE) 5 MG immediate release tablet    Sig: Take 1 tablet (5 mg total) by mouth every 4 (four) hours as needed for severe pain. Must last 30 days. Max: 6/day    Dispense:  180 tablet    Refill:  0    Chronic Pain: STOP Act (Not applicable) Fill 1 day early if closed on refill date. Do not fill until: 04/15/2019. To last until: 05/15/2019. Avoid benzodiazepines within 8 hours of opioids  . cyclobenzaprine (FLEXERIL) 10 MG tablet    Sig: Take 1 tablet (10 mg total) by mouth 3 (three) times daily as needed for muscle spasms. Must last 30 days.    Dispense:  90 tablet    Refill:  2    Fill one day early if pharmacy is closed on scheduled refill date. May substitute for generic if available.  Marland Kitchen oxyCODONE (OXY IR/ROXICODONE) 5 MG immediate release tablet    Sig: Take 1 tablet (5 mg total) by mouth every 4 (four) hours as needed for  severe pain. Must last 30 days. Max: 6/day    Dispense:  180 tablet    Refill:  0    Chronic Pain: STOP Act (Not applicable) Fill 1 day early if closed on refill date. Do not fill until: 05/15/2019. To last until: 06/14/2019. Avoid benzodiazepines within 8 hours of opioids  . oxyCODONE (OXY IR/ROXICODONE) 5 MG immediate release tablet    Sig: Take 1 tablet (5 mg total) by mouth every 4 (four) hours as needed for severe pain. Must last 30 days. Max: 6/day    Dispense:  180 tablet    Refill:  0    Chronic Pain: STOP Act (Not applicable) Fill 1 day early if closed on refill date. Do not fill until: 06/14/2019. To last until: 07/14/2019. Avoid benzodiazepines within 8  hours of opioids   Orders:  No orders of the defined types were placed in this encounter.  Follow-up plan:   Return in about 3 months (around 07/13/2019) for (VV), E/M (MM).     Interventional management options:  Considering:   Diagnostic bilateral lumbar facet block  Possible bilateral lumbar facet RFA  Diagnostic bilateral cervical facet block  Possible bilateral cervical facet RFA    PRN Procedures:   None at this time    Recent Visits Date Type Provider Dept  03/09/19 Office Visit Milinda Pointer, MD [facility]  02/11/19 Office Visit Milinda Pointer, MD [facility]  Showing recent visits within past 90 days and meeting all other requirements   Today's Visits Date Type Provider Dept  04/15/19 Office Visit Milinda Pointer, MD [facility]  Showing today's visits and meeting all other requirements   Future Appointments Date Type Provider Dept  06/03/19 Appointment Milinda Pointer, MD [facility]  Showing future appointments within next 90 days and meeting all other requirements   I discussed the assessment and treatment plan with the patient. The patient was provided an opportunity to ask questions and all were answered. The patient agreed with the plan and  demonstrated an understanding of the instructions.  Patient advised to call back or seek an in-person evaluation if the symptoms or condition worsens.  Total duration of non-face-to-face encounter: 15 minutes.  Note by: Gaspar Cola, MD Date: 04/15/2019; Time: 4:59 PM  Note: This dictation was prepared with Dragon dictation. Any transcriptional errors that may result from this process are unintentional.  Disclaimer:  * Given the special circumstances of the COVID-19 pandemic, the federal government has announced that the Office for Civil Rights (OCR) will exercise its enforcement discretion and will not impose penalties on physicians using telehealth in the event of noncompliance with regulatory requirements under the [place] (HIPAA) in connection with the good faith provision of telehealth during the IPJAS-50 national public health emergency. ([place])

## 2019-04-15 ENCOUNTER — Ambulatory Visit: Payer: Medicare Other | Attending: Pain Medicine | Admitting: Pain Medicine

## 2019-04-15 ENCOUNTER — Other Ambulatory Visit: Payer: Self-pay

## 2019-04-15 DIAGNOSIS — M7918 Myalgia, other site: Secondary | ICD-10-CM

## 2019-04-15 DIAGNOSIS — M5442 Lumbago with sciatica, left side: Secondary | ICD-10-CM | POA: Diagnosis not present

## 2019-04-15 DIAGNOSIS — M79605 Pain in left leg: Secondary | ICD-10-CM

## 2019-04-15 DIAGNOSIS — G8929 Other chronic pain: Secondary | ICD-10-CM

## 2019-04-15 DIAGNOSIS — M797 Fibromyalgia: Secondary | ICD-10-CM

## 2019-04-15 DIAGNOSIS — R52 Pain, unspecified: Secondary | ICD-10-CM

## 2019-04-15 DIAGNOSIS — G894 Chronic pain syndrome: Secondary | ICD-10-CM

## 2019-04-15 DIAGNOSIS — M79604 Pain in right leg: Secondary | ICD-10-CM | POA: Diagnosis not present

## 2019-04-15 DIAGNOSIS — M5441 Lumbago with sciatica, right side: Secondary | ICD-10-CM

## 2019-04-15 MED ORDER — OXYCODONE HCL 5 MG PO TABS: 5.0000 mg | ORAL_TABLET | ORAL | 0 refills | Status: DC | PRN

## 2019-04-15 MED ORDER — CYCLOBENZAPRINE HCL 10 MG PO TABS: 10.0000 mg | ORAL_TABLET | Freq: Three times a day (TID) | ORAL | 2 refills | Status: DC | PRN

## 2019-04-29 ENCOUNTER — Ambulatory Visit: Payer: Medicare Other | Admitting: Internal Medicine

## 2019-04-29 NOTE — Progress Notes (Deleted)
New Outpatient Visit Date: 04/29/2019  Referring Provider: [name], NP [place],  [place] [address]  Chief Complaint: ***  HPI:  Ms. Danziger is a 63 y.o. female who is being seen today for the evaluation of syncope at the request of Ms. Clark. She has a history of nonobstructive coronary artery disease, stress-induced cardiomyopathy, paroxysmal atrial fibrillation complicated by tachy-brady syndrome, paroxysmal supraventricular tachycardia, type 1 diabetes mellitus, hypothyroidism, stroke, hypertension, cirrhosis, and GERD.  She has been seen multiple times by our group in the hospital, most recently 01/2016.  It was.  They felt that her bradycardia was due to medications, including amiodarone.  She was not felt to be a candidate for permanent pacemaker.  --------------------------------------------------------------------------------------------------  Cardiovascular History & Procedures: Cardiovascular Problems:  Paroxysmal atrial fibrillation  Paroxysmal supraventricular tachycardia  Nonobstructive coronary artery disease  Stress-induced cardiomyopathy.  Risk Factors:  Hypertension, diabetes mellitus, and age greater than 48  Cath/PCI:  LHC (01/12/2016): Minimal luminal irregularities without evidence of obstructive coronary artery disease.  Mildly to moderately reduced left ventricular systolic function (EF 34%) with wall motion abnormality suggestive of stress-induced cardiomyopathy.  Mildly elevated LVEDP.  CV Surgery:  None  EP Procedures and Devices:  None  Non-Invasive Evaluation(s):  TTE (12/29/2017): Normal LV size.  LVEF 55-60%.  Mild to moderate mitral regurgitation.  Mild left atrial enlargement.  Normal RV size and function.  Normal RVSP.  Recent CV Pertinent Labs: Lab Results  Component Value Date   CHOL 104 09/12/2018   CHOL 182 11/12/2011   HDL 43.40 09/12/2018   HDL 23 (L) 11/12/2011   LDLCALC 42 09/12/2018   LDLCALC 129 (H)  11/12/2011   TRIG 90.0 09/12/2018   TRIG 149 11/12/2011   CHOLHDL 2 09/12/2018   INR 1.3 (H) 11/26/2018   INR 1.2 10/08/2014   BNP 247.0 (H) 01/10/2016   K 4.0 04/10/2019   K 4.1 10/14/2014   MG 1.8 11/25/2018   MG 1.6 (L) 10/13/2014   BUN 17 04/10/2019   BUN 3 (L) 10/14/2014   CREATININE 1.74 (H) 04/10/2019   CREATININE 0.68 10/14/2014    --------------------------------------------------------------------------------------------------  Past Medical History:  Diagnosis Date  . Acute encephalopathy 05/22/2018  . Allergy   . Anxiety   . Ascites   . C. difficile colitis 07/10/2015  . Cancer (HCC)    HX OF CANCER OF UTERUS   . Cirrhosis of liver not due to alcohol ([place]) 2016  . Degenerative disk disease   . Diverticulitis   . Gastroparesis   . GERD (gastroesophageal reflux disease)   . History of hiatal hernia   . Hypertension   . Hypothyroid   . Hypothyroidism due to amiodarone   . Ileus ([place]) 08/01/2015  . Intussusception intestine ([place]) 05/2015  . Orthostatic hypotension   . PAF (paroxysmal atrial fibrillation) ([place]) 03/2015   a. new onset 03/2015 in setting of intractable N/V; b. on Eliquis 5 mg bid; c. CHADSVASc 4 (DM, TIA x 2, female)  . Pancreatitis   . Pneumonia 11/14/2015  . Right ureteral stone 07/14/2016  . Sick sinus syndrome ([place])   . Stomach ulcer   . Stroke [facility])    with minimal left sided weakness  . Syncope 01/2015  . Syncope due to orthostatic hypotension 05/18/2015  . Tachyarrhythmia 01/10/2016  . TIA (transient ischemic attack) 02/2015  . Type 1 diabetes ([place])    on levemir  . UTERINE CANCER, HX OF 03/27/2007   Qualifier: Diagnosis of  By:  Bean FNP, Rosalita Levan   . UTI (urinary tract infection) 05/22/2018    Past Surgical History:  Procedure Laterality Date  . ABDOMINAL HYSTERECTOMY    . CARDIAC CATHETERIZATION N/A 01/12/2016   Procedure: Left Heart Cath and Coronary Angiography;  Surgeon: Wellington Hampshire, MD;  Location: [place] CV LAB;   Service: Cardiovascular;  Laterality: N/A;  . CHOLECYSTECTOMY    . CYSTOSCOPY/URETEROSCOPY/HOLMIUM LASER Right 07/14/2016   Procedure: CYSTOSCOPY/URETEROSCOPY/HOLMIUM LASER;  Surgeon: Alexis Frock, MD;  Location: [facility] ORS;  Service: Urology;  Laterality: Right;  . ESOPHAGOGASTRODUODENOSCOPY N/A 04/04/2015   Procedure: ESOPHAGOGASTRODUODENOSCOPY (EGD);  Surgeon: Hulen Luster, MD;  Location: [facility] ENDOSCOPY;  Service: Endoscopy;  Laterality: N/A;  . ESOPHAGOGASTRODUODENOSCOPY N/A 12/28/2017   Procedure: ESOPHAGOGASTRODUODENOSCOPY (EGD);  Surgeon: Lin Landsman, MD;  [facility] END[facility]COPY;  Service: Gastroenterology;  Laterality: N/A;  . ESOPHAGOGASTRODUODENOSCOPY (EGD) WITH PROPOFOL N/A 01/18/2016   Procedure: ESOPHAGOGASTRODUODENOSCOPY (EGD) WITH PROPOFOL;  Surgeon: Lucilla Lame, MD;  Location: [facility] ENDOSCOPY;  Service: Endoscopy;  Laterality: N/A;  . FLEXIBLE SIGMOIDOSCOPY N/A 01/18/2016   Procedure: FLEXIBLE SIGMOIDOSCOPY;  Surgeon: Lucilla Lame, MD;  Location: [facility] ENDOSCOPY;  Service: Endoscopy;  Laterality: N/A;  . HERNIA REPAIR      No outpatient medications have been marked as taking for the 04/29/19 encounter (Appointment) with Idelia Caudell, Harrell Gave, MD.    Allergies: Aspirin, Codeine sulfate, Erythromycin, Prednisone, Rosiglitazone maleate, and Tetanus-diphtheria toxoids td  Social History   Tobacco Use  . Smoking status: Former Smoker    Types: Cigarettes  . Smokeless tobacco: Never Used  . Tobacco comment: 25 years ago and only smoked occasionally  Substance Use Topics  . Alcohol use: No  . Drug use: No    Family History  Problem Relation Age of Onset  . Hypertension Mother   . CAD Sister   . Heart attack Sister        Deceased 25-Nov-2014  . CAD Brother     Review of Systems: A 12-system review of systems was performed and was negative except as noted in the HPI.  --------------------------------------------------------------------------------------------------   Physical Exam: There were no vitals taken for this visit.  General:  *** HEENT: No conjunctival pallor or scleral icterus. Moist mucous membranes. OP clear. Neck: Supple without lymphadenopathy, thyromegaly, JVD, or HJR. No carotid bruit. Lungs: Normal work of breathing. Clear to auscultation bilaterally without wheezes or crackles. Heart: Regular rate and rhythm without murmurs, rubs, or gallops. Non-displaced PMI. Abd: Bowel sounds present. Soft, NT/ND without hepatosplenomegaly Ext: No lower extremity edema. Radial, PT, and DP pulses are 2+ bilaterally Skin: Warm and dry without rash. Neuro: CNIII-XII intact. Strength and fine-touch sensation intact in upper and lower extremities bilaterally. Psych: Normal mood and affect.  EKG:  ***  Lab Results  Component Value Date   WBC 5.0 04/10/2019   HGB 10.2 (L) 04/10/2019   HCT 32.7 (L) 04/10/2019   MCV 80.3 04/10/2019   PLT 188.0 04/10/2019    Lab Results  Component Value Date   NA 128 (L) 04/10/2019   K 4.0 04/10/2019   CL 100 04/10/2019   CO2 19 04/10/2019   BUN 17 04/10/2019   CREATININE 1.74 (H) 04/10/2019   GLUCOSE 422 (H) 04/10/2019   ALT 27 04/10/2019    Lab Results  Component Value Date   CHOL 104 09/12/2018   HDL 43.40 09/12/2018   LDLCALC 42 09/12/2018   TRIG 90.0 09/12/2018   CHOLHDL 2 09/12/2018     --------------------------------------------------------------------------------------------------  ASSESSMENT AND PLAN: ***  Nelva Bush, MD 04/29/2019 1:19 PM

## 2019-05-04 ENCOUNTER — Telehealth: Payer: Self-pay | Admitting: Pain Medicine

## 2019-05-04 ENCOUNTER — Other Ambulatory Visit: Payer: Self-pay | Admitting: Pain Medicine

## 2019-05-04 DIAGNOSIS — G894 Chronic pain syndrome: Secondary | ICD-10-CM

## 2019-05-04 NOTE — Progress Notes (Signed)
Cardiology Office Note  Date:  05/05/2019   ID:  [name] Apr 01, 1956, MRN [id number]  PCP:  Rita Koch, NP   Chief Complaint  Patient presents with  . New Patient (Initial Visit)    referred by PCP for syncope. Patient c.o active chest pain. Patient states in the last 3 weeks she has passed out about 16 times. meds reviewed verbally with patient.     HPI:   [name] is a 63 year old woman with past medical history of Diabetes type 2 poorly controlled Alcohol use, liver cirrhosis Uterine cancer Chronic pain gastroparesis Paroxysmal atrial fibrillation on anticoagulation Type 1 diabetes Presenting by referral from [facility] for syncope, sinus tachycardia tachycardia, "atrial fibrillation"  Previous records reviewed "03/2015 in setting of intractable N/V; b. on Eliquis 5 mg bid; c. CHADSVASc 4 (DM, TIA x 2, female)"  She reports having long history of chronic nausea, anorexia Stomach upset now, nausea 5 days Has not eated in 3-4 days ago  "tries to drink water" "dry heaves" frequently  Weight up and down, was 155 recently, now 134? Was in 4s in April 2020 Weight 119 March 2020  Syncope "15 times"  Past 3 to 4 weeks No warning, With getting up from a standing position, always happens when standing Lives in trailer, only able to walk short distance before she will have a passout spell No episodes overnight,   Prior syncope Feb to march 2019,  Syncope preceded by ringing in ears  12/2017: tach 116 07/2016: tachy, rate 145 bpm 06/2016: rate 109 bpm 04/2016: rate 120s sinus tach 02/2016: rate 115 bpm 01/2016: rate 79 bpm  Paracentesis January 2020 Had 2 times  Echocardiogram April 2019, report reviewed personally by myself Normal ejection fraction Mild to moderate MR  EKG personally reviewed by myself on todays visit  Shows sinus tachycardia rate 122 bpm unable to exclude old inferior MI, consider LVH by voltage criteria   PMH:   has a  past medical history of Acute encephalopathy (05/22/2018), Allergy, Anxiety, Ascites, C. difficile colitis (07/10/2015), Cancer ([place]), Cirrhosis of liver not due to alcohol ([place]) (2016), Degenerative disk disease, Diverticulitis, Gastroparesis, GERD (gastroesophageal reflux disease), History of hiatal hernia, Hypertension, Hypothyroid, Hypothyroidism due to amiodarone, Ileus ([place]) (08/01/2015), Intussusception intestine ([place]) (05/2015), Orthostatic hypotension, PAF (paroxysmal atrial fibrillation) ([place]) (03/2015), Pancreatitis, Pneumonia (11/14/2015), Right ureteral stone (07/14/2016), Sick sinus syndrome ([place]), Stomach ulcer, Stroke ([place]), Syncope (01/2015), Syncope due to orthostatic hypotension (05/18/2015), Tachyarrhythmia (01/10/2016), TIA (transient ischemic attack) (02/2015), Type 1 diabetes ([place]), UTERINE CANCER, HX OF (03/27/2007), and UTI (urinary tract infection) (05/22/2018).  PSH:    Past Surgical History:  Procedure Laterality Date  . ABDOMINAL HYSTERECTOMY    . CARDIAC CATHETERIZATION N/A 01/12/2016   Procedure: Left Heart Cath and Coronary Angiography;  Surgeon: Rita Hampshire, MD;  Location: [place] CV LAB;  Service: Cardiovascular;  Laterality: N/A;  . CHOLECYSTECTOMY    . CYSTOSCOPY/URETEROSCOPY/HOLMIUM LASER Right 07/14/2016   Procedure: CYSTOSCOPY/URETEROSCOPY/HOLMIUM LASER;  Surgeon: Rita Frock, MD;  Location: [facility] ORS;  Service: Urology;  Laterality: Right;  . ESOPHAGOGASTRODUODENOSCOPY N/A 04/04/2015   Procedure: ESOPHAGOGASTRODUODENOSCOPY (EGD);  Surgeon: Rita Luster, MD;  Location: [facility] ENDOSCOPY;  Service: Endoscopy;  Laterality: N/A;  . ESOPHAGOGASTRODUODENOSCOPY N/A 12/28/2017   Procedure: ESOPHAGOGASTRODUODENOSCOPY (EGD);  Surgeon: Rita Landsman, MD;  Location: [facility] ENDOSCOPY;  Service: Gastroenterology;  Laterality: N/A;  . ESOPHAGOGASTRODUODENOSCOPY (EGD) WITH PROPOFOL N/A 01/18/2016   Procedure: ESOPHAGOGASTRODUODENOSCOPY (EGD) WITH PROPOFOL;  Surgeon: Rita Lame, MD;  Location: [facility] ENDOSCOPY;  Service: Endoscopy;  Laterality: N/A;  . FLEXIBLE SIGMOIDOSCOPY N/A 01/18/2016   Procedure: FLEXIBLE SIGMOIDOSCOPY;  Surgeon: Rita Lame, MD;  Location: [facility] ENDOSCOPY;  Service: Endoscopy;  Laterality: N/A;  . HERNIA REPAIR      Current Outpatient Medications  Medication Sig Dispense Refill  . albuterol (PROVENTIL HFA;VENTOLIN HFA) 108 (90 Base) MCG/ACT inhaler Inhale 2 puffs into the lungs every 6 (six) hours as needed for wheezing or shortness of breath. 1 Inhaler 2  . apixaban (ELIQUIS) 5 MG TABS tablet Take 5 mg by mouth 2 (two) times daily.    Marland Kitchen atorvastatin (LIPITOR) 40 MG tablet TAKE ONE TABLET BY MOUTH EVERY DAY FOR CHOLESTEROL 90 tablet 1  . Blood Glucose Monitoring Suppl (CONTOUR BLOOD GLUCOSE SYSTEM) w/Device KIT Use to test blood sugar 3 times daily. Dx is E11.65 1 each 0  . calcium carbonate (CALCIUM 600) 600 MG TABS tablet Take 1 tablet (600 mg total) by mouth 2 (two) times daily with a meal. 60 tablet 5  . Cholecalciferol (VITAMIN D3) 125 MCG (5000 UT) CAPS Take 1 capsule (5,000 Units total) by mouth daily with breakfast. Take along with calcium and magnesium. 180 capsule 0  . Cyanocobalamin (VITAMIN B-12) 5000 MCG SUBL Place 1 tablet (5,000 mcg total) under the tongue daily. 30 tablet 5  . [START ON 05/12/2019] cyclobenzaprine (FLEXERIL) 10 MG tablet Take 1 tablet (10 mg total) by mouth 3 (three) times daily as needed for muscle spasms. Must last 30 days. 90 tablet 2  . ergocalciferol (VITAMIN D2) 1.25 MG (50000 UT) capsule Take 1 capsule (50,000 Units total) by mouth 2 (two) times a week. X 6 weeks. 12 capsule 1  . glucose blood (ACCU-CHEK AVIVA PLUS) test strip Use as instructed to test blood sugar 3 times daily 100 each 5  . Insulin Detemir (LEVEMIR) 100 UNIT/ML Pen Inject 30 units in the morning and 30 units at bedtime. 15 mL 5  . Insulin Pen Needle (PEN NEEDLES) 31G X 6 MM MISC     . Lancet Device MISC Use as instructed to test blood sugar 3 times  daily. Dx is E11.65 100 each 2  . Magnesium 500 MG CAPS Take 1 capsule (500 mg total) by mouth 2 (two) times daily at 8 am and 10 pm. 60 capsule 5  . metoprolol tartrate (LOPRESSOR) 25 MG tablet Take 1 tablet (25 mg total) by mouth 2 (two) times daily. For heart rate. 180 tablet 3  . oxyCODONE (OXY IR/ROXICODONE) 5 MG immediate release tablet Take 1 tablet (5 mg total) by mouth every 4 (four) hours as needed for severe pain. Must last 30 days. Max: 6/day 180 tablet 0  . [START ON 05/15/2019] oxyCODONE (OXY IR/ROXICODONE) 5 MG immediate release tablet Take 1 tablet (5 mg total) by mouth every 4 (four) hours as needed for severe pain. Must last 30 days. Max: 6/day 180 tablet 0  . [START ON 06/14/2019] oxyCODONE (OXY IR/ROXICODONE) 5 MG immediate release tablet Take 1 tablet (5 mg total) by mouth every 4 (four) hours as needed for severe pain. Must last 30 days. Max: 6/day 180 tablet 0   No current facility-administered medications for this visit.      Allergies:   Aspirin, Codeine sulfate, Erythromycin, Prednisone, Rosiglitazone maleate, and Tetanus-diphtheria toxoids td   Social History:  The patient  reports that she has quit smoking. Her smoking use included cigarettes. She has never used smokeless tobacco. She reports that she does not drink alcohol or  use drugs.   Family History:   family history includes CAD in her brother and sister; Heart attack in her sister; Hypertension in her mother.    Review of Systems: Review of Systems  Constitutional: Negative.   HENT: Negative.   Respiratory: Negative.   Cardiovascular: Negative.        Palpitations  Gastrointestinal: Negative.   Musculoskeletal: Negative.   Neurological: Positive for loss of consciousness.  Psychiatric/Behavioral: Negative.   All other systems reviewed and are negative.    PHYSICAL EXAM: VS:  BP 100/70 (BP Location: Right Arm, Patient Position: Sitting, Cuff Size: Normal)   Pulse (!) 122   Ht 5' 3"  (1.6 m)   Wt 134  lb 8 oz (61 kg)   BMI 23.83 kg/m  , BMI Body mass index is 23.83 kg/m. GEN: Well nourished, well developed, in no acute distress HEENT: normal Neck: no JVD, carotid bruits, or masses Cardiac: RRR; no murmurs, rubs, or gallops,no edema  Respiratory:  clear to auscultation bilaterally, normal work of breathing GI: soft, nontender, nondistended, + BS MS: no deformity or atrophy Skin: warm and dry, no rash Neuro:  Strength and sensation are intact Psych: euthymic mood, full affect    Recent Labs: 09/25/2018: TSH 5.301 11/25/2018: Magnesium 1.8 04/10/2019: ALT 27; BUN 17; Creatinine, Ser 1.74; Hemoglobin 10.2; Platelets 188.0; Potassium 4.0; Sodium 128    Lipid Panel Lab Results  Component Value Date   CHOL 104 09/12/2018   HDL 43.40 09/12/2018   LDLCALC 42 09/12/2018   TRIG 90.0 09/12/2018      Wt Readings from Last 3 Encounters:  05/05/19 134 lb 8 oz (61 kg)  04/10/19 155 lb 12 oz (70.6 kg)  02/09/19 130 lb 12.8 oz (59.3 kg)       ASSESSMENT AND PLAN:  Problem List Items Addressed This Visit      Cardiology Problems   Hyperlipidemia with target LDL less than 100 (Chronic)   Paroxysmal atrial fibrillation (HCC) (Chronic)   Relevant Orders   EKG 12-Lead   Orthostatic hypotension     Other   Sinus tachycardia - Primary     Syncope Likely consistent with orthostasis Systolic pressure 818 on today's visit in the sitting position Very poor intake per the patient, secondary to chronic nausea and dry heaving Notes in the computer indicating history of gastroparesis -Poor intake likely exacerbating her symptoms Recommend she try Zofran as needed to increase her calorie intake, fluid intake -For low pressure will try midodrine 10 mg 3 times daily given the frequency of her syncope  Sinus tachycardia Heart rate reviewed going back several years, typically always elevated more than 100 up to 120 bpm Metoprolol succinate 25 mg added  Atrial fibrillation,  paroxysmal Seen on EKG April 05, 2015.  EKG reviewed Numerous EKGs reviewed since that time, no further documentation of atrial fibrillation We will need to address with her whether she needs to stay on Eliquis given frequent syncope and concern she will injure herself Medication changes as above in effort to curb her syncope  Gastroparesis Given chronicity of symptoms, may need referral to GI Prescription for Zofran called in, may need Reglan  Disposition:   F/U  12 months   Total encounter time more than 60 minutes  Greater than 50% was spent in counseling and coordination of care with the patient    Signed, Esmond Plants, M.D., Ph.D. [place], [place]

## 2019-05-04 NOTE — Telephone Encounter (Signed)
Attempted to call patient. No answer and no answering machine to leave a message.

## 2019-05-04 NOTE — Telephone Encounter (Signed)
Patient lvmail 05-04-19 stating she needs to speak with someone about her meds.

## 2019-05-05 ENCOUNTER — Other Ambulatory Visit: Payer: Self-pay

## 2019-05-05 ENCOUNTER — Ambulatory Visit (INDEPENDENT_AMBULATORY_CARE_PROVIDER_SITE_OTHER): Payer: Medicare Other | Admitting: Cardiovascular Disease

## 2019-05-05 VITALS — BP 100/70 | HR 122 | Ht 63.0 in | Wt 134.5 lb

## 2019-05-05 DIAGNOSIS — I951 Orthostatic hypotension: Secondary | ICD-10-CM

## 2019-05-05 DIAGNOSIS — R000 Tachycardia, unspecified: Secondary | ICD-10-CM

## 2019-05-05 DIAGNOSIS — I480 Paroxysmal atrial fibrillation: Secondary | ICD-10-CM | POA: Diagnosis not present

## 2019-05-05 DIAGNOSIS — E785 Hyperlipidemia, unspecified: Secondary | ICD-10-CM | POA: Diagnosis not present

## 2019-05-05 MED ORDER — ONDANSETRON HCL 4 MG PO TABS: 4.0000 mg | ORAL_TABLET | Freq: Three times a day (TID) | ORAL | 3 refills | Status: DC | PRN

## 2019-05-05 MED ORDER — MIDODRINE HCL 10 MG PO TABS: 10.0000 mg | ORAL_TABLET | Freq: Three times a day (TID) | ORAL | 6 refills | Status: DC

## 2019-05-05 MED ORDER — METOPROLOL SUCCINATE ER 25 MG PO TB24: 25.0000 mg | ORAL_TABLET | Freq: Every day | ORAL | 3 refills | Status: DC

## 2019-05-05 NOTE — Patient Instructions (Addendum)
Medication Instructions:  Your physician has recommended you make the following change in your medication:  1. START Midodrine 10 mg three times a day (9 am, 2 pm, 8 pm) 2. START Metoprolol succinate 25 mg once a day 3. AS NEEDED Zofran 4 mg Every 8 hours as needed for nausea   If you need a refill on your cardiac medications before your next appointment, please call your pharmacy.    Lab work: No new labs needed   If you have labs (blood work) drawn today and your tests are completely normal, you will receive your results only by: Marland Kitchen MyChart Message (if you have MyChart) OR . A paper copy in the mail If you have any lab test that is abnormal or we need to change your treatment, we will call you to review the results.   Testing/Procedures: No new testing needed   Follow-Up: At [facility], you and your health needs are our priority.  As part of our continuing mission to provide you with exceptional heart care, we have created designated Provider Care Teams.  These Care Teams include your primary Cardiologist (physician) and Advanced Practice Providers (APPs -  Physician Assistants and Nurse Practitioners) who all work together to provide you with the care you need, when you need it.  . You will need a follow up appointment in 1 month  . Providers on your designated Care Team:   . Murray Hodgkins, NP . Christell Faith, PA-C . Marrianne Mood, PA-C  Any Other Special Instructions Will Be Listed Below (If Applicable).  For educational health videos Log in to : [email or web address] Or : [email or web address], password : [email or web address]

## 2019-05-11 ENCOUNTER — Telehealth: Payer: Medicare Other | Admitting: Cardiovascular Disease

## 2019-05-12 ENCOUNTER — Ambulatory Visit: Payer: Medicare Other | Admitting: Primary Care

## 2019-05-16 ENCOUNTER — Encounter: Payer: Self-pay | Admitting: Family Medicine

## 2019-05-16 ENCOUNTER — Inpatient Hospital Stay: Payer: Medicare Other

## 2019-05-16 ENCOUNTER — Other Ambulatory Visit: Payer: Self-pay

## 2019-05-16 ENCOUNTER — Observation Stay
Admission: EM | Admit: 2019-05-16 | Discharge: 2019-05-17 | Disposition: A | Discharge status: Home / Self Care | Payer: Medicare Other | Attending: Internal Medicine | Admitting: Internal Medicine

## 2019-05-16 ENCOUNTER — Emergency Department: Payer: Medicare Other

## 2019-05-16 DIAGNOSIS — Z8542 Personal history of malignant neoplasm of other parts of uterus: Secondary | ICD-10-CM | POA: Insufficient documentation

## 2019-05-16 DIAGNOSIS — E039 Hypothyroidism, unspecified: Secondary | ICD-10-CM | POA: Diagnosis not present

## 2019-05-16 DIAGNOSIS — I480 Paroxysmal atrial fibrillation: Secondary | ICD-10-CM | POA: Diagnosis not present

## 2019-05-16 DIAGNOSIS — M549 Dorsalgia, unspecified: Secondary | ICD-10-CM | POA: Diagnosis not present

## 2019-05-16 DIAGNOSIS — R112 Nausea with vomiting, unspecified: Secondary | ICD-10-CM

## 2019-05-16 DIAGNOSIS — E1022 Type 1 diabetes mellitus with diabetic chronic kidney disease: Secondary | ICD-10-CM | POA: Diagnosis not present

## 2019-05-16 DIAGNOSIS — R52 Pain, unspecified: Secondary | ICD-10-CM

## 2019-05-16 DIAGNOSIS — N183 Chronic kidney disease, stage 3 (moderate): Secondary | ICD-10-CM | POA: Diagnosis not present

## 2019-05-16 DIAGNOSIS — K3184 Gastroparesis: Secondary | ICD-10-CM | POA: Diagnosis not present

## 2019-05-16 DIAGNOSIS — R7982 Elevated C-reactive protein (CRP): Secondary | ICD-10-CM | POA: Insufficient documentation

## 2019-05-16 DIAGNOSIS — N179 Acute kidney failure, unspecified: Secondary | ICD-10-CM | POA: Diagnosis not present

## 2019-05-16 DIAGNOSIS — R1084 Generalized abdominal pain: Secondary | ICD-10-CM

## 2019-05-16 DIAGNOSIS — Z8673 Personal history of transient ischemic attack (TIA), and cerebral infarction without residual deficits: Secondary | ICD-10-CM | POA: Insufficient documentation

## 2019-05-16 DIAGNOSIS — I495 Sick sinus syndrome: Secondary | ICD-10-CM | POA: Insufficient documentation

## 2019-05-16 DIAGNOSIS — G8929 Other chronic pain: Secondary | ICD-10-CM | POA: Insufficient documentation

## 2019-05-16 DIAGNOSIS — F419 Anxiety disorder, unspecified: Secondary | ICD-10-CM | POA: Insufficient documentation

## 2019-05-16 DIAGNOSIS — R404 Transient alteration of awareness: Secondary | ICD-10-CM | POA: Diagnosis not present

## 2019-05-16 DIAGNOSIS — S83242A Other tear of medial meniscus, current injury, left knee, initial encounter: Secondary | ICD-10-CM | POA: Insufficient documentation

## 2019-05-16 DIAGNOSIS — I2510 Atherosclerotic heart disease of native coronary artery without angina pectoris: Secondary | ICD-10-CM | POA: Diagnosis not present

## 2019-05-16 DIAGNOSIS — M5489 Other dorsalgia: Secondary | ICD-10-CM | POA: Diagnosis not present

## 2019-05-16 DIAGNOSIS — Z90710 Acquired absence of both cervix and uterus: Secondary | ICD-10-CM | POA: Diagnosis not present

## 2019-05-16 DIAGNOSIS — X58XXXA Exposure to other specified factors, initial encounter: Secondary | ICD-10-CM | POA: Diagnosis not present

## 2019-05-16 DIAGNOSIS — M25462 Effusion, left knee: Secondary | ICD-10-CM | POA: Insufficient documentation

## 2019-05-16 DIAGNOSIS — M797 Fibromyalgia: Secondary | ICD-10-CM | POA: Insufficient documentation

## 2019-05-16 DIAGNOSIS — Z87891 Personal history of nicotine dependence: Secondary | ICD-10-CM | POA: Insufficient documentation

## 2019-05-16 DIAGNOSIS — N390 Urinary tract infection, site not specified: Secondary | ICD-10-CM | POA: Diagnosis present

## 2019-05-16 DIAGNOSIS — K7460 Unspecified cirrhosis of liver: Secondary | ICD-10-CM | POA: Diagnosis not present

## 2019-05-16 DIAGNOSIS — K7030 Alcoholic cirrhosis of liver without ascites: Secondary | ICD-10-CM | POA: Diagnosis not present

## 2019-05-16 DIAGNOSIS — N1330 Unspecified hydronephrosis: Secondary | ICD-10-CM | POA: Diagnosis not present

## 2019-05-16 DIAGNOSIS — I129 Hypertensive chronic kidney disease with stage 1 through stage 4 chronic kidney disease, or unspecified chronic kidney disease: Secondary | ICD-10-CM | POA: Insufficient documentation

## 2019-05-16 DIAGNOSIS — K219 Gastro-esophageal reflux disease without esophagitis: Secondary | ICD-10-CM | POA: Insufficient documentation

## 2019-05-16 DIAGNOSIS — Z79899 Other long term (current) drug therapy: Secondary | ICD-10-CM | POA: Insufficient documentation

## 2019-05-16 DIAGNOSIS — Z20828 Contact with and (suspected) exposure to other viral communicable diseases: Secondary | ICD-10-CM | POA: Diagnosis not present

## 2019-05-16 DIAGNOSIS — Z7901 Long term (current) use of anticoagulants: Secondary | ICD-10-CM | POA: Diagnosis not present

## 2019-05-16 DIAGNOSIS — Z794 Long term (current) use of insulin: Secondary | ICD-10-CM | POA: Insufficient documentation

## 2019-05-16 DIAGNOSIS — E1043 Type 1 diabetes mellitus with diabetic autonomic (poly)neuropathy: Secondary | ICD-10-CM | POA: Diagnosis not present

## 2019-05-16 DIAGNOSIS — K5730 Diverticulosis of large intestine without perforation or abscess without bleeding: Secondary | ICD-10-CM | POA: Insufficient documentation

## 2019-05-16 DIAGNOSIS — I700 Atherosclerosis of aorta: Secondary | ICD-10-CM | POA: Insufficient documentation

## 2019-05-16 DIAGNOSIS — E860 Dehydration: Secondary | ICD-10-CM | POA: Insufficient documentation

## 2019-05-16 DIAGNOSIS — N136 Pyonephrosis: Secondary | ICD-10-CM | POA: Insufficient documentation

## 2019-05-16 DIAGNOSIS — S82832A Other fracture of upper and lower end of left fibula, initial encounter for closed fracture: Secondary | ICD-10-CM | POA: Insufficient documentation

## 2019-05-16 DIAGNOSIS — E785 Hyperlipidemia, unspecified: Secondary | ICD-10-CM | POA: Insufficient documentation

## 2019-05-16 DIAGNOSIS — E1165 Type 2 diabetes mellitus with hyperglycemia: Secondary | ICD-10-CM | POA: Diagnosis not present

## 2019-05-16 DIAGNOSIS — Z960 Presence of urogenital implants: Secondary | ICD-10-CM | POA: Insufficient documentation

## 2019-05-16 LAB — BASIC METABOLIC PANEL
Anion gap: 14 (ref 5–15)
BUN: 25 mg/dL — ABNORMAL HIGH (ref 8–23)
CO2: 15 mmol/L — ABNORMAL LOW (ref 22–32)
Calcium: 8.8 mg/dL — ABNORMAL LOW (ref 8.9–10.3)
Chloride: 107 mmol/L (ref 98–111)
Creatinine, Ser: 2.28 mg/dL — ABNORMAL HIGH (ref 0.44–1.00)
GFR calc Af Amer: 25 mL/min — ABNORMAL LOW (ref >60)
GFR calc non Af Amer: 22 mL/min — ABNORMAL LOW (ref >60)
Glucose, Bld: 255 mg/dL — ABNORMAL HIGH (ref 70–99)
Potassium: 4.6 mmol/L (ref 3.5–5.1)
Sodium: 136 mmol/L (ref 135–145)

## 2019-05-16 LAB — CBC WITH DIFFERENTIAL/PLATELET
Abs Immature Granulocytes: 0.04 10*3/uL (ref 0.00–0.07)
Basophils Absolute: 0 10*3/uL (ref 0.0–0.1)
Basophils Relative: 0 %
Eosinophils Absolute: 0 10*3/uL (ref 0.0–0.5)
Eosinophils Relative: 0 %
HCT: 32.1 % — ABNORMAL LOW (ref 36.0–46.0)
Hemoglobin: 9.9 g/dL — ABNORMAL LOW (ref 12.0–15.0)
Immature Granulocytes: 0 %
Lymphocytes Relative: 8 %
Lymphs Abs: 0.7 10*3/uL (ref 0.7–4.0)
MCH: 25.7 pg — ABNORMAL LOW (ref 26.0–34.0)
MCHC: 30.8 g/dL (ref 30.0–36.0)
MCV: 83.4 fL (ref 80.0–100.0)
Monocytes Absolute: 0.4 10*3/uL (ref 0.1–1.0)
Monocytes Relative: 5 %
Neutro Abs: 8.2 10*3/uL — ABNORMAL HIGH (ref 1.7–7.7)
Neutrophils Relative %: 87 %
Platelets: 162 10*3/uL (ref 150–400)
RBC: 3.85 MIL/uL — ABNORMAL LOW (ref 3.87–5.11)
RDW: 17.7 % — ABNORMAL HIGH (ref 11.5–15.5)
WBC: 9.4 10*3/uL (ref 4.0–10.5)
nRBC: 0 % (ref 0.0–0.2)

## 2019-05-16 LAB — URINALYSIS, ROUTINE W REFLEX MICROSCOPIC
Bilirubin Urine: NEGATIVE
Glucose, UA: 50 mg/dL — AB
Ketones, ur: NEGATIVE mg/dL
Nitrite: NEGATIVE
Protein, ur: 100 mg/dL — AB
RBC / HPF: >50 RBC/hpf — ABNORMAL HIGH (ref 0–5)
Specific Gravity, Urine: 1.016 (ref 1.005–1.030)
WBC, UA: >50 WBC/hpf — ABNORMAL HIGH (ref 0–5)
pH: 5 (ref 5.0–8.0)

## 2019-05-16 LAB — SARS CORONAVIRUS 2 (TAT 6-24 HRS): SARS Coronavirus 2: NEGATIVE

## 2019-05-16 LAB — HEPATIC FUNCTION PANEL
ALT: 23 U/L (ref 0–44)
AST: 39 U/L (ref 15–41)
Albumin: 3.3 g/dL — ABNORMAL LOW (ref 3.5–5.0)
Alkaline Phosphatase: 145 U/L — ABNORMAL HIGH (ref 38–126)
Bilirubin, Direct: 0.3 mg/dL — ABNORMAL HIGH (ref 0.0–0.2)
Indirect Bilirubin: 0.3 mg/dL (ref 0.3–0.9)
Total Bilirubin: 0.6 mg/dL (ref 0.3–1.2)
Total Protein: 7 g/dL (ref 6.5–8.1)

## 2019-05-16 LAB — GLUCOSE, CAPILLARY
Glucose-Capillary: 101 mg/dL — ABNORMAL HIGH (ref 70–99)
Glucose-Capillary: 46 mg/dL — ABNORMAL LOW (ref 70–99)
Glucose-Capillary: 54 mg/dL — ABNORMAL LOW (ref 70–99)
Glucose-Capillary: 72 mg/dL (ref 70–99)
Glucose-Capillary: 82 mg/dL (ref 70–99)
Glucose-Capillary: 94 mg/dL (ref 70–99)

## 2019-05-16 LAB — TROPONIN I (HIGH SENSITIVITY): Troponin I (High Sensitivity): 12 ng/L (ref <18)

## 2019-05-16 LAB — LIPASE, BLOOD: Lipase: 38 U/L (ref 11–51)

## 2019-05-16 LAB — TSH: TSH: 3.399 u[IU]/mL (ref 0.350–4.500)

## 2019-05-16 MED ORDER — DOCUSATE SODIUM 100 MG PO CAPS
100.0000 mg | ORAL_CAPSULE | Freq: Two times a day (BID) | ORAL | Status: DC
  Administered 2019-05-16 (×2): 100 mg via ORAL
  Filled 2019-05-16 (×3): qty 1

## 2019-05-16 MED ORDER — INSULIN DETEMIR 100 UNIT/ML ~~LOC~~ SOLN
24.0000 [IU] | Freq: Every day | SUBCUTANEOUS | Status: DC
  Filled 2019-05-16: qty 0.24

## 2019-05-16 MED ORDER — SODIUM CHLORIDE 0.9 % IV SOLN
INTRAVENOUS | Status: DC
  Administered 2019-05-16 – 2019-05-17 (×3): via INTRAVENOUS

## 2019-05-16 MED ORDER — ACETAMINOPHEN 650 MG RE SUPP: 650.0000 mg | Freq: Four times a day (QID) | RECTAL | Status: DC | PRN

## 2019-05-16 MED ORDER — CYCLOBENZAPRINE HCL 10 MG PO TABS
10.0000 mg | ORAL_TABLET | Freq: Three times a day (TID) | ORAL | Status: DC | PRN
  Administered 2019-05-16 – 2019-05-17 (×3): 10 mg via ORAL
  Filled 2019-05-16 (×3): qty 1

## 2019-05-16 MED ORDER — SODIUM CHLORIDE 0.9 % IV SOLN
1.0000 g | INTRAVENOUS | Status: DC
  Filled 2019-05-16 (×2): qty 10

## 2019-05-16 MED ORDER — INSULIN ASPART 100 UNIT/ML ~~LOC~~ SOLN
0.0000 [IU] | Freq: Three times a day (TID) | SUBCUTANEOUS | Status: DC
  Administered 2019-05-17: 5 [IU] via SUBCUTANEOUS
  Filled 2019-05-16 (×2): qty 1

## 2019-05-16 MED ORDER — SODIUM CHLORIDE 0.9 % IV SOLN
1.0000 g | Freq: Once | INTRAVENOUS | Status: AC
  Administered 2019-05-16: 1 g via INTRAVENOUS
  Filled 2019-05-16: qty 10

## 2019-05-16 MED ORDER — SODIUM CHLORIDE 0.9 % IV SOLN
1.0000 g | INTRAVENOUS | Status: DC
  Administered 2019-05-17: 1 g via INTRAVENOUS
  Filled 2019-05-16: qty 10
  Filled 2019-05-16: qty 1

## 2019-05-16 MED ORDER — OXYCODONE HCL 5 MG PO TABS
5.0000 mg | ORAL_TABLET | ORAL | Status: DC | PRN
  Administered 2019-05-16 – 2019-05-17 (×6): 5 mg via ORAL
  Filled 2019-05-16 (×6): qty 1

## 2019-05-16 MED ORDER — ONDANSETRON HCL 4 MG/2ML IJ SOLN
4.0000 mg | Freq: Once | INTRAMUSCULAR | Status: AC
  Administered 2019-05-16: 4 mg via INTRAVENOUS
  Filled 2019-05-16: qty 2

## 2019-05-16 MED ORDER — ONDANSETRON HCL 4 MG/2ML IJ SOLN
4.0000 mg | Freq: Four times a day (QID) | INTRAMUSCULAR | Status: DC | PRN
  Administered 2019-05-16: 4 mg via INTRAVENOUS
  Filled 2019-05-16: qty 2

## 2019-05-16 MED ORDER — FENTANYL CITRATE (PF) 100 MCG/2ML IJ SOLN
50.0000 ug | Freq: Once | INTRAMUSCULAR | Status: AC
  Administered 2019-05-16: 50 ug via INTRAVENOUS
  Filled 2019-05-16: qty 2

## 2019-05-16 MED ORDER — ACETAMINOPHEN 325 MG PO TABS: 650.0000 mg | ORAL_TABLET | Freq: Four times a day (QID) | ORAL | Status: DC | PRN

## 2019-05-16 MED ORDER — APIXABAN 5 MG PO TABS
5.0000 mg | ORAL_TABLET | Freq: Two times a day (BID) | ORAL | Status: DC
  Administered 2019-05-16 – 2019-05-17 (×3): 5 mg via ORAL
  Filled 2019-05-16 (×3): qty 1

## 2019-05-16 MED ORDER — ALBUTEROL SULFATE (2.5 MG/3ML) 0.083% IN NEBU: 2.5000 mg | INHALATION_SOLUTION | RESPIRATORY_TRACT | Status: DC | PRN

## 2019-05-16 MED ORDER — SODIUM CHLORIDE 0.9 % IV SOLN: 1.0000 g | INTRAVENOUS | Status: DC

## 2019-05-16 MED ORDER — METOPROLOL SUCCINATE ER 25 MG PO TB24
25.0000 mg | ORAL_TABLET | Freq: Every day | ORAL | Status: DC
  Administered 2019-05-16 – 2019-05-17 (×2): 25 mg via ORAL
  Filled 2019-05-16 (×2): qty 1

## 2019-05-16 MED ORDER — ONDANSETRON HCL 4 MG PO TABS: 4.0000 mg | ORAL_TABLET | Freq: Four times a day (QID) | ORAL | Status: DC | PRN

## 2019-05-16 MED ORDER — SODIUM CHLORIDE 0.9 % IV BOLUS
1000.0000 mL | Freq: Once | INTRAVENOUS | Status: AC
  Administered 2019-05-16: 03:00:00 1000 mL via INTRAVENOUS

## 2019-05-16 NOTE — Assessment & Plan Note (Addendum)
Likely multifactorial cause. Pt with very complicated PMH.  eval with labs and follow up with cardiology closely.

## 2019-05-16 NOTE — ED Notes (Signed)
Transport to floor room 236.AS 

## 2019-05-16 NOTE — Plan of Care (Signed)
  Problem: Education: Goal: Knowledge of General Education information will improve Description: Including pain rating scale, medication(s)/side effects and non-pharmacologic comfort measures Outcome: Progressing   Problem: Activity: Goal: Risk for activity intolerance will decrease Outcome: Progressing Note: Patient up to the [facility] with one assist.

## 2019-05-16 NOTE — Progress Notes (Addendum)
Pt blood sugar at 94 tonight and have a schedule 24 units of levemir daily at bedtime. Notify prime. Will continue continue to monitor.  Update 2156: Dr. Jannifer Franklin ordered to hold the levemir tonight. Will notify incoming shift.Will continue to monitor.  Update 0337: Pt still tea colored urine with a pink tint to it. Will notify incoming shift. Will continue to monitor.

## 2019-05-16 NOTE — Progress Notes (Signed)
Hypoglycemic Event  CBG: 54  Treatment: 4 oz orange juice   Symptoms: patient states she is a little light headed.    Follow-up CBG: Time:1230 CBG Result:72  Possible Reasons for Event: did not eat a good breafast, just had coffee.   Comments/MD notified: Original sugar was 54, patient was eating lunch but did not drink the 4oz of orange juice so recheck sugar was 46. Told patient to drink the juice and after drinking the juice patient's sugar was 72.      Feliberto Gottron

## 2019-05-16 NOTE — ED Triage Notes (Signed)
Pt to ED via EMS from home. Pt arrives c/o chronic back pain. Per ems pt has had poo rpo intake xfew days, pt has been vomiting, unable to keep food down. Per ems pt is AMS, pt able to answer orientation questions, x3. Pt lethargic, arousal to voice. Per ems pt initially hypotensive, 70/39, pt now 101/57 550cc given.

## 2019-05-16 NOTE — ED Notes (Signed)
Patient given a phone to call her husband.

## 2019-05-16 NOTE — ED Provider Notes (Signed)
[facility] Emergency Department Provider Note  ____________________________________________  Time seen: Approximately 2:25 AM  I have reviewed the triage vital signs and the nursing notes.   HISTORY  Chief Complaint Back Pain and Altered Mental Status   HPI [name] is a 63 y.o. female history of alcoholic cirrhosis, diabetes, chronic abdominal and back pain on chronic narcotics, diverticulitis, hypertension, hypothyroidism, A. fib, uterine cancer, fibromyalgia, cholecystectomy, hysterectomy, appendectomy, gastroparesis who presents for evaluation of N/V and abdominal pain.  Patient reports that her symptoms have been ongoing for 2 to 3 weeks.  She is complaining of diffuse sharp abdominal pain, severe constant nausea, several daily episodes of dry heaving or vomiting, has been unable to keep her pills down.  Constipation x 1 week, passing flatus. Has been unable to sleep.  She is also complaining of dysuria for a week.  No CP or SOB. Unknown if she has had fever. Patient comes in today because her husband called 40.   Past Medical History:  Diagnosis Date   Acute encephalopathy 05/22/2018   Allergy    Anxiety    Ascites    C. difficile colitis 07/10/2015   Cancer ([place])    HX OF CANCER OF UTERUS    Cirrhosis of liver not due to alcohol ([place]) 2016   Degenerative disk disease    Diverticulitis    Gastroparesis    GERD (gastroesophageal reflux disease)    History of hiatal hernia    Hypertension    Hypothyroid    Hypothyroidism due to amiodarone    Ileus ([place]) 08/01/2015   Intussusception intestine ([place]) 05/2015   Orthostatic hypotension    PAF (paroxysmal atrial fibrillation) ([place]) 03/2015   a. new onset 03/2015 in setting of intractable N/V; b. on Eliquis 5 mg bid; c. CHADSVASc 4 (DM, TIA x 2, female)   Pancreatitis    Pneumonia 11/14/2015   Right ureteral stone 07/14/2016   Sick sinus syndrome ([place])    Stomach ulcer      Stroke ([place])    with minimal left sided weakness   Syncope 01/2015   Syncope due to orthostatic hypotension 05/18/2015   Tachyarrhythmia 01/10/2016   TIA (transient ischemic attack) 02/2015   Type 1 diabetes ([place])    on levemir   UTERINE CANCER, HX OF 03/27/2007   Qualifier: Diagnosis of  By: Maxie Better FNP, Rosalita Levan    UTI (urinary tract infection) 05/22/2018    Patient Active Problem List   Diagnosis Date Noted   Sinus tachycardia 05/05/2019   CAD (coronary artery disease) 04/10/2019   Lumbar facet syndrome (Bilateral) (R>L) 03/09/2019   Long term current use of anticoagulant therapy (Eliquis) 03/09/2019   Hepatic cirrhosis, unspecified type ([place]) 02/11/2019   Hypoalbuminemia 02/11/2019   Edema due to hypoalbuminemia 02/11/2019   Suspected Covid-19 Virus Infection 02/09/2019   Lower extremity edema 01/26/2019   DDD (degenerative disc disease), lumbar 12/17/2018   Lumbar intervertebral disc displacement 12/17/2018   Chronic musculoskeletal pain 12/17/2018   Chronic generalized pain 12/17/2018   Hyponatremia 12/17/2018   Fibromyalgia syndrome 12/17/2018   Vitamin D deficiency 11/26/2018   Elevated C-reactive protein (CRP) 11/26/2018   Elevated sed rate 11/26/2018   Chronic low back pain (Primary area of Pain) (Bilateral) (L>R) w/ sciatica (Bilateral) 11/25/2018   Chronic lower extremity pain (Secondary Area of Pain) (Bilateral) (L>R) 11/25/2018   Chronic neck pain 11/25/2018   Pharmacologic therapy 11/25/2018   Disorder of skeletal system 11/25/2018   Problems influencing health status  11/25/2018   Renal mass 10/06/2018   Chronic intermittent abdominal pain 09/25/2018   Recurrent falls 09/12/2018   Orthostatic hypotension 08/18/2018   Normochromic normocytic anemia 08/18/2018   CKD (chronic kidney disease), stage III ([place]) 08/18/2018   History of hypothyroidism 08/14/2018   Medical non-compliance 08/14/2018   Diabetic  ketoacidosis ([place]) 05/22/2018   UTI (urinary tract infection) 05/22/2018   Sepsis ([place]) 12/15/2017   Personal history of surgery to heart and great vessels, presenting hazards to health    Congestive dilated cardiomyopathy ([place])    NSTEMI (non-ST elevated myocardial infarction) ([place]) 01/11/2016   Tachy-brady syndrome ([place]) 12/28/2015   PAF (paroxysmal atrial fibrillation) ([place]) 11/24/2015   Type 2 diabetes mellitus with neurologic complication ([place]) 50/38/8828   S/P cholecystectomy 11/23/2015   Narcotic abuse ([place]) 11/22/2015   Chronic superficial gastritis without bleeding 11/22/2015   History of Clostridium difficile 11/22/2015   History of TIA (transient ischemic attack) 11/22/2015   Mixed hyperlipidemia 11/22/2015   Diverticulosis 11/22/2015   History of uterine cancer 11/22/2015   Hypothyroidism, unspecified 11/22/2015   Intractable cyclical vomiting with nausea 11/22/2015   Narcotic withdrawal ([place]) 11/11/2015   H/O TIA (transient ischemic attack) and stroke    Encounter to establish care 08/31/2015   Insulin dependent type 2 diabetes mellitus ([place]) 08/31/2015   Hyperlipidemia 08/31/2015   Cryptogenic cirrhosis () 07/10/2015   Paroxysmal atrial fibrillation ([place]) 07/10/2015   Malnutrition of moderate degree 07/04/2015   Symptomatic bradycardia 05/27/2015   Chronic anemia 05/19/2015   Thrombocytopenia ([place]) 05/19/2015   Prolonged QT interval    Hypomagnesemia    Uncontrolled type 2 diabetes mellitus ([place])    Hypokalemia 04/06/2015   Hyperlipidemia with target LDL less than 100 04/06/2015   CVA (cerebral vascular accident) ([place]) 02/15/2015   Essential hypertension 01/12/2015   Chronically on opiate therapy 01/12/2015   S/P Nissen fundoplication (without gastrostomy tube) procedure 05/11/2014   Dysphagia 04/19/2014   DEPRESSION/ANXIETY 06/27/2007   Myofascial pain syndrome 06/27/2007   Chronic pain syndrome 03/28/2007   GERD  03/27/2007   DIVERTICULOSIS, COLON 03/27/2007   Proteinuria 03/27/2007    Past Surgical History:  Procedure Laterality Date   ABDOMINAL HYSTERECTOMY     CARDIAC CATHETERIZATION N/A 01/12/2016   Procedure: Left Heart Cath and Coronary Angiography;  Surgeon: Wellington Hampshire, MD;  Location: [facility];  Service: Cardiovascular;  Laterality: N/A;   CHOLECYSTECTOMY     CYSTOSCOPY/URETEROSCOPY/HOLMIUM LASER Right 07/14/2016   Procedure: CYSTOSCOPY/URETEROSCOPY/HOLMIUM LASER;  Surgeon: Alexis Frock, MD;  Location: [facility] ORS;  Service: Urology;  Laterality: Right;   ESOPHAGOGASTRODUODENOSCOPY N/A 04/04/2015   Procedure: ESOPHAGOGASTRODUODENOSCOPY (EGD);  Surgeon: Hulen Luster, MD;  [facility] END[facility];  Service: Endoscopy;  Laterality: N/A;   ESOPHAGOGASTRODUODENOSCOPY N/A 12/28/2017   Procedure: ESOPHAGOGASTRODUODENOSCOPY (EGD);  Surgeon: Lin Landsman, MD;  Location: [facility] ENDOSCOPY;  Service: Gastroenterology;  Laterality: N/A;   ESOPHAGOGASTRODUODENOSCOPY (EGD) WITH PROPOFOL N/A 01/18/2016   Procedure: ESOPHAGOGASTRODUODENOSCOPY (EGD) WITH PROPOFOL;  Surgeon: Lucilla Lame, MD;  Location: [facility] ENDOSCOPY;  Service: Endoscopy;  Laterality: N/A;   FLEXIBLE SIGMOIDOSCOPY N/A 01/18/2016   Procedure: FLEXIBLE SIGMOIDOSCOPY;  Surgeon: Lucilla Lame, MD;  Location: [facility] ENDOSCOPY;  Service: Endoscopy;  Laterality: N/A;   HERNIA REPAIR      Prior to Admission medications   Medication Sig Start Date End Date Taking? Authorizing Provider  albuterol (PROVENTIL HFA;VENTOLIN HFA) 108 (90 Base) MCG/ACT inhaler Inhale 2 puffs into the lungs every 6 (six) hours as needed for wheezing or shortness of breath. 11/27/18  Epifanio Lesches, MD  apixaban (ELIQUIS) 5 MG TABS tablet Take 5 mg by mouth 2 (two) times daily.    [provider]  atorvastatin (LIPITOR) 40 MG tablet TAKE ONE TABLET BY MOUTH EVERY DAY FOR CHOLESTEROL 02/17/19   [name], NP  Blood Glucose Monitoring  Suppl (CONTOUR BLOOD GLUCOSE SYSTEM) w/Device KIT Use to test blood sugar 3 times daily. Dx is E11.65 08/28/18   [name], NP  calcium carbonate (CALCIUM 600) 600 MG TABS tablet Take 1 tablet (600 mg total) by mouth 2 (two) times daily with a meal. 12/17/18 06/15/19  Milinda Pointer, MD  Cholecalciferol (VITAMIN D3) 125 MCG (5000 UT) CAPS Take 1 capsule (5,000 Units total) by mouth daily with breakfast. Take along with calcium and magnesium. 12/17/18 06/15/19  Milinda Pointer, MD  Cyanocobalamin (VITAMIN B-12) 5000 MCG SUBL Place 1 tablet (5,000 mcg total) under the tongue daily. 12/17/18 06/15/19  Milinda Pointer, MD  cyclobenzaprine (FLEXERIL) 10 MG tablet Take 1 tablet (10 mg total) by mouth 3 (three) times daily as needed for muscle spasms. Must last 30 days. 05/12/19 08/10/19  Milinda Pointer, MD  glucose blood (ACCU-CHEK AVIVA PLUS) test strip Use as instructed to test blood sugar 3 times daily 02/05/19   [name], NP  Insulin Detemir (LEVEMIR) 100 UNIT/ML Pen Inject 30 units in the morning and 30 units at bedtime. 02/09/19   [name], NP  Insulin Pen Needle (PEN NEEDLES) 31G X 6 MM MISC  02/17/19   [provider]  Lancet Device MISC Use as instructed to test blood sugar 3 times daily. Dx is E11.65 08/28/18   [name], NP  Magnesium 500 MG CAPS Take 1 capsule (500 mg total) by mouth 2 (two) times daily at 8 am and 10 pm. 12/17/18 06/15/19  Milinda Pointer, MD  metoprolol succinate (TOPROL-XL) 25 MG 24 hr tablet Take 1 tablet (25 mg total) by mouth daily. 05/05/19   Minna Merritts, MD  midodrine (PROAMATINE) 10 MG tablet Take 1 tablet (10 mg total) by mouth 3 (three) times daily. 05/05/19   Minna Merritts, MD  ondansetron (ZOFRAN) 4 MG tablet Take 1 tablet (4 mg total) by mouth every 8 (eight) hours as needed for nausea or vomiting. 05/05/19   Minna Merritts, MD  oxyCODONE (OXY IR/ROXICODONE) 5 MG immediate release tablet Take 1 tablet (5 mg  total) by mouth every 4 (four) hours as needed for severe pain. Must last 30 days. Max: 6/day 05/15/19 06/14/19  Milinda Pointer, MD  oxyCODONE (OXY IR/ROXICODONE) 5 MG immediate release tablet Take 1 tablet (5 mg total) by mouth every 4 (four) hours as needed for severe pain. Must last 30 days. Max: 6/day 06/14/19 07/14/19  Milinda Pointer, MD    Allergies Aspirin, Codeine sulfate, Erythromycin, Prednisone, Rosiglitazone maleate, and Tetanus-diphtheria toxoids td  Family History  Problem Relation Age of Onset   Hypertension Mother    CAD Sister    Heart attack Sister        Deceased 2014-11-19   CAD Brother     Social History Social History   Tobacco Use   Smoking status: Former Smoker    Types: Cigarettes   Smokeless tobacco: Never Used   Tobacco comment: 25 years ago and only smoked occasionally  Substance Use Topics   Alcohol use: No   Drug use: No    Review of Systems  Constitutional: Negative for fever. Eyes: Negative for visual changes. ENT: Negative for sore  throat. Neck: No neck pain  Cardiovascular: Negative for chest pain. Respiratory: Negative for shortness of breath. Gastrointestinal: + abdominal pain, nausea, constipation, vomiting Genitourinary: + dysuria. Musculoskeletal: Negative for back pain. Skin: Negative for rash. Neurological: Negative for headaches, weakness or numbness. Psych: No SI or HI. + insomnia  ____________________________________________   PHYSICAL EXAM:  VITAL SIGNS: ED Triage Vitals  Enc Vitals Group     BP 05/16/19 0201 (!) 101/57     Pulse Rate 05/16/19 0201 (!) 116     Resp 05/16/19 0201 20     Temp 05/16/19 0201 98.5 F (36.9 C)     Temp Source 05/16/19 0201 Oral     SpO2 05/16/19 0201 97 %     Weight 05/16/19 0159 134 lb 7.7 oz (61 kg)     Height 05/16/19 0159 5' 3"  (1.6 m)     Head Circumference --      Peak Flow --      Pain Score 05/16/19 0159 7     Pain Loc --      Pain Edu? --      Excl. in [place]? --      Constitutional: Alert and oriented, chronically ill appearing, no distress.  HEENT:      Head: Normocephalic and atraumatic.         Eyes: Conjunctivae are normal. Sclera is non-icteric.       Mouth/Throat: Mucous membranes are dry.       Neck: Supple with no signs of meningismus. Cardiovascular: Tachycardic with regular rate and rhythm. No murmurs, gallops, or rubs. 2+ symmetrical distal pulses are present in all extremities. No JVD. Respiratory: Normal respiratory effort. Lungs are clear to auscultation bilaterally. No wheezes, crackles, or rhonchi.  Gastrointestinal: Soft, diffuse tenderness to palpation, and non distended with positive bowel sounds. No rebound or guarding. Musculoskeletal: Nontender with normal range of motion in all extremities. No edema, cyanosis, or erythema of extremities. Neurologic: Normal speech and language. Face is symmetric. Moving all extremities. No gross focal neurologic deficits are appreciated. Skin: Skin is warm, dry and intact. No rash noted. Psychiatric: Mood and affect are normal. Speech and behavior are normal.  ____________________________________________   LABS (all labs ordered are listed, but only abnormal results are displayed)  Labs Reviewed  CBC WITH DIFFERENTIAL/PLATELET - Abnormal; Notable for the following components:      Result Value   RBC 3.85 (*)    Hemoglobin 9.9 (*)    HCT 32.1 (*)    MCH 25.7 (*)    RDW 17.7 (*)    Neutro Abs 8.2 (*)    All other components within normal limits  BASIC METABOLIC PANEL - Abnormal; Notable for the following components:   CO2 15 (*)    Glucose, Bld 255 (*)    BUN 25 (*)    Creatinine, Ser 2.28 (*)    Calcium 8.8 (*)    GFR calc non Af Amer 22 (*)    GFR calc Af Amer 25 (*)    All other components within normal limits  URINALYSIS, ROUTINE W REFLEX MICROSCOPIC - Abnormal; Notable for the following components:   Color, Urine YELLOW (*)    APPearance TURBID (*)    Glucose, UA 50 (*)     Hgb urine dipstick LARGE (*)    Protein, ur 100 (*)    Leukocytes,Ua LARGE (*)    RBC / HPF >50 (*)    WBC, UA >50 (*)    Bacteria, UA RARE (*)  All other components within normal limits  HEPATIC FUNCTION PANEL - Abnormal; Notable for the following components:   Albumin 3.3 (*)    Alkaline Phosphatase 145 (*)    Bilirubin, Direct 0.3 (*)    All other components within normal limits  SARS CORONAVIRUS 2 (TAT 6-24 HRS)  URINE CULTURE  LIPASE, BLOOD  TROPONIN I (HIGH SENSITIVITY)   ____________________________________________  EKG  ED ECG REPORT I, Rudene Re, the attending physician, personally viewed and interpreted this ECG.  Sinus tachycardia, rate of 115, normal intervals, normal axis, no ST elevations or depressions, T wave inversions in inferior lateral leads which are unchanged from prior ____________________________________________  RADIOLOGY  I have personally reviewed the images performed during this visit and I agree with the Radiologist's read.   Interpretation by Radiologist:  Ct Abdomen Pelvis Wo Contrast  Result Date: 05/16/2019 CLINICAL DATA:  Abdominal pain EXAM: CT ABDOMEN AND PELVIS WITHOUT CONTRAST TECHNIQUE: Multidetector CT imaging of the abdomen and pelvis was performed following the standard protocol without IV contrast. COMPARISON:  09/25/2018 FINDINGS: Lower chest: No acute abnormality. Hepatobiliary: Nodular liver compatible with cirrhosis. Prior cholecystectomy. Pancreas: No focal abnormality or ductal dilatation. Spleen: Mildly enlarged, stable since prior study. Adrenals/Urinary Tract: Chronic mild right hydronephrosis with right ureteral stent in place, stable. No hydronephrosis on the left. Adrenal glands and urinary bladder unremarkable. Stomach/Bowel: Moderate stool throughout the colon, particularly right colon and transverse colon. Normal appendix. No evidence of bowel obstruction. Scattered sigmoid diverticula. Stomach is decompressed,  grossly unremarkable. Vascular/Lymphatic: Aortic atherosclerosis. No enlarged abdominal or pelvic lymph nodes. Reproductive: Prior hysterectomy.  No adnexal masses. Other: Insert others Musculoskeletal: No acute bony abnormality. IMPRESSION: Changes of cirrhosis.  Associated splenomegaly. No ascites currently. Chronic mild right hydronephrosis with right ureteral stent in place. Findings are unchanged. Aortic atherosclerosis. Electronically Signed   By: Rolm Baptise M.D.   On: 05/16/2019 03:24      ____________________________________________   PROCEDURES  Procedure(s) performed: None Procedures Critical Care performed:  None ____________________________________________   INITIAL IMPRESSION / ASSESSMENT AND PLAN / ED COURSE  63 y.o. female history of alcoholic cirrhosis, diabetes, chronic abdominal and back pain on chronic narcotics, diverticulitis, hypertension, hypothyroidism, A. fib, uterine cancer, fibromyalgia, cholecystectomy, hysterectomy, appendectomy, gastroparesis who presents for evaluation of N/V, abdominal pain, constipation, dysuria for several weeks.  Patient is chronically ill-appearing but in no obvious distress, she looks dry on exam, abdomen is nondistended with diffuse tenderness throughout, positive bowel sounds.  Differential diagnosis is broad and includes infection versus obstruction versus electrolyte abnormalities versus dehydration versus acute kidney injury versus DKA.  Plan for CBC, CMP, lipase, urinalysis, CT abdomen pelvis.  Will give IV fluids, Zofran and fentanyl for symptom relief.  EKG unchanged from baseline.    _________________________ 4:47 AM on 05/16/2019 -----------------------------------------  Labs showing acute kidney injury and urinary tract infection.  No signs of sepsis or DKA.  Patient was given Rocephin, IV fluids, will admit to the hospitalist service.    As part of my medical decision making, I reviewed the following data within the  [place] notes reviewed and incorporated, Labs reviewed , EKG interpreted , Old EKG reviewed, Old chart reviewed, Radiograph reviewed , Discussed with admitting physician , Notes from prior ED visits and [place] Controlled Substance Database   Patient was evaluated in Emergency Department today for the symptoms described in the history of present illness. Patient was evaluated in the context of the global COVID-19 pandemic, which necessitated consideration that the  patient might be at risk for infection with the SARS-CoV-2 virus that causes COVID-19. Institutional protocols and algorithms that pertain to the evaluation of patients at risk for COVID-19 are in a state of rapid change based on information released by regulatory bodies including the CDC and federal and state organizations. These policies and algorithms were followed during the patient's care in the ED.   ____________________________________________   FINAL CLINICAL IMPRESSION(S) / ED DIAGNOSES   Final diagnoses:  AKI (acute kidney injury) ([place])  Generalized abdominal pain  Non-intractable vomiting with nausea, unspecified vomiting type      NEW MEDICATIONS STARTED DURING THIS VISIT:  ED Discharge Orders    None       Note:  This document was prepared using Dragon voice recognition software and may include unintentional dictation errors.    Alfred Levins, [place], MD 05/16/19 [phone]

## 2019-05-16 NOTE — Progress Notes (Addendum)
Patient requesting her home dose of flexeril, Dr. Benjie Karvonen text paged for that order. Verbal order to put in her home dose cyclobenzaprine (FLEXERIL) 10 mg oral 3 times daily PRN for muscle spasms.   1759:Patient's urine dark tea colored with some pink color to it. Dr. Benjie Karvonen made aware, verbal orders with read back to increase running fluids 0.9% sodium chloride infusion to 125 ml/hr and continue to monitor urine.

## 2019-05-16 NOTE — H&P (Addendum)
[name] is an 63 y.o. female.   Chief Complaint: Back pain HPI: The patient with extensive past medical history most significant for cirrhosis of the liver, atrial fibrillation, hypertension and type 1 diabetes presents to the emergency department complaining of back pain.  The patient's mental status was reportedly altered as well.  She has had nausea and vomiting x1 week.  Emesis is been nonbloody nonbilious.  Laboratory evaluation revealed acute kidney injury and urinalysis was consistent with infection.  The patient was given ceftriaxone prior to the emergency department staff called the hospitalist service for admission.  Past Medical History:  Diagnosis Date  . Acute encephalopathy 05/22/2018  . Allergy   . Anxiety   . Ascites   . C. difficile colitis 07/10/2015  . Cancer (HCC)    HX OF CANCER OF UTERUS   . Cirrhosis of liver not due to alcohol ([place]) 11-06-14  . Degenerative disk disease   . Diverticulitis   . Gastroparesis   . GERD (gastroesophageal reflux disease)   . History of hiatal hernia   . Hypertension   . Hypothyroid   . Hypothyroidism due to amiodarone   . Ileus ([place]) 08/01/2015  . Intussusception intestine ([place]) 05/2015  . Orthostatic hypotension   . PAF (paroxysmal atrial fibrillation) ([place]) 03/2015   a. new onset 03/2015 in setting of intractable N/V; b. on Eliquis 5 mg bid; c. CHADSVASc 4 (DM, TIA x 2, female)  . Pancreatitis   . Pneumonia 11/14/2015  . Right ureteral stone 07/14/2016  . Sick sinus syndrome ([place])   . Stomach ulcer   . Stroke [facility])    with minimal left sided weakness  . Syncope 01/2015  . Syncope due to orthostatic hypotension 05/18/2015  . Tachyarrhythmia 01/10/2016  . TIA (transient ischemic attack) 02/2015  . Type 1 diabetes ([place])    on levemir  . UTERINE CANCER, HX OF 03/27/2007   Qualifier: Diagnosis of  By: Maxie Better FNP, Rosalita Levan   . UTI (urinary tract infection) 05/22/2018    Past Surgical History:  Procedure Laterality Date  .  ABDOMINAL HYSTERECTOMY    . CARDIAC CATHETERIZATION N/A 01/12/2016   Procedure: Left Heart Cath and Coronary Angiography;  Surgeon: Wellington Hampshire, MD;  Location: [place] CV LAB;  Service: Cardiovascular;  Laterality: N/A;  . CHOLECYSTECTOMY    . CYSTOSCOPY/URETEROSCOPY/HOLMIUM LASER Right 07/14/2016   Procedure: CYSTOSCOPY/URETEROSCOPY/HOLMIUM LASER;  Surgeon: Alexis Frock, MD;  Location: [facility] ORS;  Service: Urology;  Laterality: Right;  . ESOPHAGOGASTRODUODENOSCOPY N/A 04/04/2015   Procedure: ESOPHAGOGASTRODUODENOSCOPY (EGD);  Surgeon: Hulen Luster, MD;  Location: [facility] ENDOSCOPY;  Service: Endoscopy;  Laterality: N/A;  . ESOPHAGOGASTRODUODENOSCOPY N/A 12/28/2017   Procedure: ESOPHAGOGASTRODUODENOSCOPY (EGD);  Surgeon: Lin Landsman, MD;  Location[facility] ENDOSCOPY;  Service: Gastroenterology;  Laterality: N/A;  . ESOPHAGOGASTRODUODENOSCOPY (EGD) WITH PROPOFOL N/A 01/18/2016   Procedure: ESOPHAGOGASTRODUODENOSCOPY (EGD) WITH PROPOFOL;  Surgeon: Lucilla Lame, MD;  Location: [facility] ENDOSCOPY;  Service: Endoscopy;  Laterality: N/A;  . FLEXIBLE SIGMOIDOSCOPY N/A 01/18/2016   Procedure: FLEXIBLE SIGMOIDOSCOPY;  Surgeon: Lucilla Lame, MD;  Location: [facility] ENDOSCOPY;  Service: Endoscopy;  Laterality: N/A;  . HERNIA REPAIR      Family History  Problem Relation Age of Onset  . Hypertension Mother   . CAD Sister   . Heart attack Sister        Deceased 11/06/14  . CAD Brother    Social History:  reports that she has quit smoking. Her smoking use included cigarettes. She has never used  smokeless tobacco. She reports that she does not drink alcohol or use drugs.  Allergies:  Allergies  Allergen Reactions  . Aspirin Rash  . Codeine Sulfate Rash  . Erythromycin Rash    Reaction:  Fever   . Prednisone Swelling  . Rosiglitazone Maleate Swelling  . Tetanus-Diphtheria Toxoids Td Rash and Other (See Comments)    Reaction:  Fever     Prior to Admission medications   Medication Sig Start Date End  Date Taking? Authorizing Provider  albuterol (PROVENTIL HFA;VENTOLIN HFA) 108 (90 Base) MCG/ACT inhaler Inhale 2 puffs into the lungs every 6 (six) hours as needed for wheezing or shortness of breath. 11/27/18   Epifanio Lesches, MD  apixaban (ELIQUIS) 5 MG TABS tablet Take 5 mg by mouth 2 (two) times daily.    [provider]  atorvastatin (LIPITOR) 40 MG tablet TAKE ONE TABLET BY MOUTH EVERY DAY FOR CHOLESTEROL 02/17/19   [name], NP  Blood Glucose Monitoring Suppl (CONTOUR BLOOD GLUCOSE SYSTEM) w/Device KIT Use to test blood sugar 3 times daily. Dx is E11.65 08/28/18   [name], NP  calcium carbonate (CALCIUM 600) 600 MG TABS tablet Take 1 tablet (600 mg total) by mouth 2 (two) times daily with a meal. 12/17/18 06/15/19  Milinda Pointer, MD  Cholecalciferol (VITAMIN D3) 125 MCG (5000 UT) CAPS Take 1 capsule (5,000 Units total) by mouth daily with breakfast. Take along with calcium and magnesium. 12/17/18 06/15/19  Milinda Pointer, MD  Cyanocobalamin (VITAMIN B-12) 5000 MCG SUBL Place 1 tablet (5,000 mcg total) under the tongue daily. 12/17/18 06/15/19  Milinda Pointer, MD  cyclobenzaprine (FLEXERIL) 10 MG tablet Take 1 tablet (10 mg total) by mouth 3 (three) times daily as needed for muscle spasms. Must last 30 days. 05/12/19 08/10/19  Milinda Pointer, MD  glucose blood (ACCU-CHEK AVIVA PLUS) test strip Use as instructed to test blood sugar 3 times daily 02/05/19   [name], NP  Insulin Detemir (LEVEMIR) 100 UNIT/ML Pen Inject 30 units in the morning and 30 units at bedtime. 02/09/19   [name], NP  Insulin Pen Needle (PEN NEEDLES) 31G X 6 MM MISC  02/17/19   [provider]  Lancet Device MISC Use as instructed to test blood sugar 3 times daily. Dx is E11.65 08/28/18   [name], NP  Magnesium 500 MG CAPS Take 1 capsule (500 mg total) by mouth 2 (two) times daily at 8 am and 10 pm. 12/17/18 06/15/19  Milinda Pointer, MD  metoprolol  succinate (TOPROL-XL) 25 MG 24 hr tablet Take 1 tablet (25 mg total) by mouth daily. 05/05/19   Minna Merritts, MD  midodrine (PROAMATINE) 10 MG tablet Take 1 tablet (10 mg total) by mouth 3 (three) times daily. 05/05/19   Minna Merritts, MD  ondansetron (ZOFRAN) 4 MG tablet Take 1 tablet (4 mg total) by mouth every 8 (eight) hours as needed for nausea or vomiting. 05/05/19   Minna Merritts, MD  oxyCODONE (OXY IR/ROXICODONE) 5 MG immediate release tablet Take 1 tablet (5 mg total) by mouth every 4 (four) hours as needed for severe pain. Must last 30 days. Max: 6/day 05/15/19 06/14/19  Milinda Pointer, MD  oxyCODONE (OXY IR/ROXICODONE) 5 MG immediate release tablet Take 1 tablet (5 mg total) by mouth every 4 (four) hours as needed for severe pain. Must last 30 days. Max: 6/day 06/14/19 07/14/19  Milinda Pointer, MD     Results for orders placed or performed during  the hospital encounter of 05/16/19 (from the past 48 hour(s))  CBC with Differential     Status: Abnormal   Collection Time: 05/16/19  2:06 AM  Result Value Ref Range   WBC 9.4 4.0 - 10.5 K/uL   RBC 3.85 (L) 3.87 - 5.11 MIL/uL   Hemoglobin 9.9 (L) 12.0 - 15.0 g/dL   HCT 32.1 (L) 36.0 - 46.0 %   MCV 83.4 80.0 - 100.0 fL   MCH 25.7 (L) 26.0 - 34.0 pg   MCHC 30.8 30.0 - 36.0 g/dL   RDW 17.7 (H) 11.5 - 15.5 %   Platelets 162 150 - 400 K/uL   nRBC 0.0 0.0 - 0.2 %   Neutrophils Relative % 87 %   Neutro Abs 8.2 (H) 1.7 - 7.7 K/uL   Lymphocytes Relative 8 %   Lymphs Abs 0.7 0.7 - 4.0 K/uL   Monocytes Relative 5 %   Monocytes Absolute 0.4 0.1 - 1.0 K/uL   Eosinophils Relative 0 %   Eosinophils Absolute 0.0 0.0 - 0.5 K/uL   Basophils Relative 0 %   Basophils Absolute 0.0 0.0 - 0.1 K/uL   Immature Granulocytes 0 %   Abs Immature Granulocytes 0.04 0.00 - 0.07 K/uL    Comment: Performed at [facility], [address] [place], [place] [address]  Basic metabolic panel     Status: Abnormal   Collection Time: 05/16/19   2:06 AM  Result Value Ref Range   Sodium 136 135 - 145 mmol/L   Potassium 4.6 3.5 - 5.1 mmol/L    Comment: HEMOLYSIS AT THIS LEVEL MAY AFFECT RESULT   Chloride 107 98 - 111 mmol/L   CO2 15 (L) 22 - 32 mmol/L   Glucose, Bld 255 (H) 70 - 99 mg/dL   BUN 25 (H) 8 - 23 mg/dL   Creatinine, Ser 2.28 (H) 0.44 - 1.00 mg/dL   Calcium 8.8 (L) 8.9 - 10.3 mg/dL   GFR calc non Af Amer 22 (L) >60 mL/min   GFR calc Af Amer 25 (L) >60 mL/min   Anion gap 14 5 - 15    Comment: Performed at [facility], [address] [place], [place] [address]  Hepatic function panel     Status: Abnormal   Collection Time: 05/16/19  2:06 AM  Result Value Ref Range   Total Protein 7.0 6.5 - 8.1 g/dL   Albumin 3.3 (L) 3.5 - 5.0 g/dL   AST 39 15 - 41 U/L   ALT 23 0 - 44 U/L   Alkaline Phosphatase 145 (H) 38 - 126 U/L   Total Bilirubin 0.6 0.3 - 1.2 mg/dL   Bilirubin, Direct 0.3 (H) 0.0 - 0.2 mg/dL   Indirect Bilirubin 0.3 0.3 - 0.9 mg/dL    Comment: Performed at [facility], [address] [place], [place] [address]  Lipase, blood     Status: None   Collection Time: 05/16/19  2:06 AM  Result Value Ref Range   Lipase 38 11 - 51 U/L    Comment: Performed at [facility], [place], [place] [address]  Troponin I (High Sensitivity)     Status: None   Collection Time: 05/16/19  2:06 AM  Result Value Ref Range   Troponin I (High Sensitivity) 12 <18 ng/L    Comment: (NOTE) Elevated high sensitivity troponin I (hsTnI) values and significant  changes across serial measurements may suggest ACS but many other  chronic and acute conditions are known  to elevate hsTnI results.  Refer to the "Links" section for chest pain algorithms and additional  guidance. Performed at [facility], [address] [place], [place] [address]   Urinalysis, Routine w reflex microscopic     Status: Abnormal   Collection Time: 05/16/19  3:59 AM  Result Value Ref Range   Color, Urine  YELLOW (A) YELLOW   APPearance TURBID (A) CLEAR   Specific Gravity, Urine 1.016 1.005 - 1.030   pH 5.0 5.0 - 8.0   Glucose, UA 50 (A) NEGATIVE mg/dL   Hgb urine dipstick LARGE (A) NEGATIVE   Bilirubin Urine NEGATIVE NEGATIVE   Ketones, ur NEGATIVE NEGATIVE mg/dL   Protein, ur 100 (A) NEGATIVE mg/dL   Nitrite NEGATIVE NEGATIVE   Leukocytes,Ua LARGE (A) NEGATIVE   RBC / HPF >50 (H) 0 - 5 RBC/hpf   WBC, UA >50 (H) 0 - 5 WBC/hpf   Bacteria, UA RARE (A) NONE SEEN   Squamous Epithelial / LPF 6-10 0 - 5    Comment: Performed at [facility], [address] [place], [place] [address]   Ct Abdomen Pelvis Wo Contrast  Result Date: 05/16/2019 CLINICAL DATA:  Abdominal pain EXAM: CT ABDOMEN AND PELVIS WITHOUT CONTRAST TECHNIQUE: Multidetector CT imaging of the abdomen and pelvis was performed following the standard protocol without IV contrast. COMPARISON:  09/25/2018 FINDINGS: Lower chest: No acute abnormality. Hepatobiliary: Nodular liver compatible with cirrhosis. Prior cholecystectomy. Pancreas: No focal abnormality or ductal dilatation. Spleen: Mildly enlarged, stable since prior study. Adrenals/Urinary Tract: Chronic mild right hydronephrosis with right ureteral stent in place, stable. No hydronephrosis on the left. Adrenal glands and urinary bladder unremarkable. Stomach/Bowel: Moderate stool throughout the colon, particularly right colon and transverse colon. Normal appendix. No evidence of bowel obstruction. Scattered sigmoid diverticula. Stomach is decompressed, grossly unremarkable. Vascular/Lymphatic: Aortic atherosclerosis. No enlarged abdominal or pelvic lymph nodes. Reproductive: Prior hysterectomy.  No adnexal masses. Other: Insert others Musculoskeletal: No acute bony abnormality. IMPRESSION: Changes of cirrhosis.  Associated splenomegaly. No ascites currently. Chronic mild right hydronephrosis with right ureteral stent in place. Findings are unchanged. Aortic atherosclerosis.  Electronically Signed   By: Rolm Baptise M.D.   On: 05/16/2019 03:24    Review of Systems  Constitutional: Negative for chills and fever.  HENT: Negative for sore throat and tinnitus.   Eyes: Negative for blurred vision and redness.  Respiratory: Negative for cough and shortness of breath.   Cardiovascular: Negative for chest pain, palpitations, orthopnea and PND.  Gastrointestinal: Positive for nausea and vomiting. Negative for abdominal pain and diarrhea.  Genitourinary: Positive for frequency. Negative for dysuria and urgency.  Musculoskeletal: Negative for joint pain and myalgias.  Skin: Negative for rash.       No lesions  Neurological: Negative for speech change, focal weakness and weakness.  Endo/Heme/Allergies: Does not bruise/bleed easily.       No temperature intolerance  Psychiatric/Behavioral: Negative for depression and suicidal ideas.    Blood pressure 135/66, pulse (!) 110, temperature 98.5 F (36.9 C), temperature source Oral, resp. rate 18, height 5' 3"  (1.6 m), weight 61 kg, SpO2 95 %. Physical Exam  Vitals reviewed. Constitutional: She is oriented to person, place, and time. She appears well-developed and well-nourished. No distress.  HENT:  Head: Normocephalic and atraumatic.  Mouth/Throat: Oropharynx is clear and moist.  Eyes: Pupils are equal, round, and reactive to light. Conjunctivae and EOM are normal. No scleral icterus.  Neck: Normal range of motion. Neck supple. No JVD present. No  tracheal deviation present. No thyromegaly present.  Cardiovascular: Normal rate, regular rhythm and normal heart sounds. Exam reveals no gallop and no friction rub.  No murmur heard. Respiratory: Effort normal and breath sounds normal.  GI: Soft. Bowel sounds are normal. She exhibits no distension. There is no abdominal tenderness.  Genitourinary:    Genitourinary Comments: Deferred   Musculoskeletal: Normal range of motion.        General: No edema.  Lymphadenopathy:     She has no cervical adenopathy.  Neurological: She is alert and oriented to person, place, and time. No cranial nerve deficit. She exhibits normal muscle tone.  Skin: Skin is warm and dry. No rash noted. No erythema.  Psychiatric: She has a normal mood and affect. Her behavior is normal. Judgment and thought content normal.     Assessment/Plan This is a 63 year old female admitted for acute kidney injury. 1.  Acute kidney injury: Secondary to dehydration.  Hydrate with intravenous fluid.  Avoid nephrotoxic agents.  Symptomatic care for nausea. 2.  UTI: Present on admission; because of nausea and vomiting.  Continue ceftriaxone.  Encourage p.o. intake and continue IV hydration. 3.  Hypertension: Controlled; continue metoprolol 4.  Diabetes mellitus type 1: Continue basal insulin.  Sliding scale insulin with meals. 5.  Hyperlipidemia: Continue statin therapy 6.  Hypothyroidism: Previous TSH elevated.  Recheck; resume Synthroid if needed. 7.  Atrial fibrillation: Rate controlled 8.  DVT prophylaxis: Eliquis 9.  GI prophylaxis: Is a full code.  Time spent on admission orders and patient care approximately 45 minutes   Harrie Foreman, MD 05/16/2019, 5:19 AM

## 2019-05-16 NOTE — ED Notes (Signed)
Pt st last "oxyconde" taken around "5pm.

## 2019-05-16 NOTE — Progress Notes (Signed)
Patient admitted this morning by Dr. Marcille Blanco.  Agree with current management for UTI.  I ordered renal ultrasound given history of ureteral stent placement and acute kidney injury   Results as stated: Mild right renal pelvicaliectasis, with right ureteral stent in place.  Diffuse right renal parenchymal atrophy and increased echogenicity.  Normal appearance of left kidney  Continue current treatment and IV fluids.  Repeat labs in a.m.  Follow-up on urine culture.  Discussed with patient briefly.

## 2019-05-16 NOTE — Assessment & Plan Note (Signed)
Urgent referral Orthopedic MD today.  Elevate, ice ankle, no weight bearing until seen.

## 2019-05-16 NOTE — Assessment & Plan Note (Signed)
Euvolemic. 

## 2019-05-16 NOTE — ED Notes (Signed)
Patient assisted with the bedpan

## 2019-05-17 ENCOUNTER — Inpatient Hospital Stay: Payer: Medicare Other

## 2019-05-17 DIAGNOSIS — M25562 Pain in left knee: Secondary | ICD-10-CM | POA: Diagnosis not present

## 2019-05-17 DIAGNOSIS — N179 Acute kidney failure, unspecified: Secondary | ICD-10-CM | POA: Diagnosis not present

## 2019-05-17 LAB — BASIC METABOLIC PANEL
Anion gap: 7 (ref 5–15)
BUN: 23 mg/dL (ref 8–23)
CO2: 19 mmol/L — ABNORMAL LOW (ref 22–32)
Calcium: 8.3 mg/dL — ABNORMAL LOW (ref 8.9–10.3)
Chloride: 109 mmol/L (ref 98–111)
Creatinine, Ser: 1.66 mg/dL — ABNORMAL HIGH (ref 0.44–1.00)
GFR calc Af Amer: 37 mL/min — ABNORMAL LOW (ref >60)
GFR calc non Af Amer: 32 mL/min — ABNORMAL LOW (ref >60)
Glucose, Bld: 299 mg/dL — ABNORMAL HIGH (ref 70–99)
Potassium: 4.4 mmol/L (ref 3.5–5.1)
Sodium: 135 mmol/L (ref 135–145)

## 2019-05-17 LAB — GLUCOSE, CAPILLARY
Glucose-Capillary: 204 mg/dL — ABNORMAL HIGH (ref 70–99)
Glucose-Capillary: 248 mg/dL — ABNORMAL HIGH (ref 70–99)
Glucose-Capillary: 286 mg/dL — ABNORMAL HIGH (ref 70–99)

## 2019-05-17 LAB — URINE CULTURE: Culture: 60000 — AB

## 2019-05-17 MED ORDER — CEPHALEXIN 250 MG PO CAPS: 250.0000 mg | ORAL_CAPSULE | Freq: Two times a day (BID) | ORAL | 0 refills | Status: AC

## 2019-05-17 MED ORDER — BACLOFEN 10 MG PO TABS
5.0000 mg | ORAL_TABLET | Freq: Three times a day (TID) | ORAL | Status: DC | PRN
  Filled 2019-05-17: qty 0.5

## 2019-05-17 NOTE — Progress Notes (Signed)
Physical Therapy Evaluation Patient Details Name: [name] MRN: [id number] DOB: May 19, 1956 Today's Date: 05/17/2019   History of Present Illness  The patient with extensive past medical history most significant for cirrhosis of the liver, atrial fibrillation, hypertension and type 1 diabetes presents to the emergency department complaining of back pain.  The patient's mental status was reportedly altered as well.  She has had nausea and vomiting x1 week.  Emesis is been nonbloody nonbilious.  Laboratory evaluation revealed acute kidney injury and urinalysis was consistent with infection  Clinical Impression  Patient is MI with all mobility including bed mobility, transfers with RW, and gait with RW 60 feet and MI. She reports moderate pain that does not get worse with mobility. She has a ramp to get into her home and does not have any skilled PT needs at this time. She will DC from PT today.    Follow Up Recommendations No PT follow up    Equipment Recommendations       Recommendations for Other Services       Precautions / Restrictions Restrictions Weight Bearing Restrictions: No      Mobility  Bed Mobility Overal bed mobility: Modified Independent                Transfers Overall transfer level: Modified independent Equipment used: Rolling walker (2 wheeled)             General transfer comment: cues for saftey  Ambulation/Gait Ambulation/Gait assistance: Supervision Gait Distance (Feet): 40 Feet Assistive device: Rolling walker (2 wheeled) Gait Pattern/deviations: Step-through pattern        Stairs            Wheelchair Mobility    Modified Rankin (Stroke Patients Only)       Balance                                             Pertinent Vitals/Pain Pain Assessment: 0-10 Pain Score: 5  Pain Location: (back) Pain Descriptors / Indicators: Aching Pain Intervention(s): Limited activity within patient's tolerance     Home Living Family/patient expects to be discharged to:: Private residence Living Arrangements: Spouse/significant other Available Help at Discharge: Family;Available 24 hours/day Type of Home: Mobile home Home Access: Level entry     Home Layout: One level        Prior Function Level of Independence: Independent with assistive device(s)               Hand Dominance        Extremity/Trunk Assessment   Upper Extremity Assessment Upper Extremity Assessment: Overall WFL for tasks assessed    Lower Extremity Assessment Lower Extremity Assessment: Overall WFL for tasks assessed       Communication   Communication: No difficulties  Cognition Arousal/Alertness: Awake/alert Behavior During Therapy: WFL for tasks assessed/performed Overall Cognitive Status: Within Functional Limits for tasks assessed                                        General Comments      Exercises     Assessment/Plan    PT Assessment Patent does not need any further PT services  PT Problem List         PT Treatment Interventions  PT Goals (Current goals can be found in the Care Plan section)  Acute Rehab PT Goals Patient Stated Goal: to go home PT Goal Formulation: All assessment and education complete, DC therapy    Frequency     Barriers to discharge        Co-evaluation               AM-PAC PT "6 Clicks" Mobility  Outcome Measure Help needed turning from your back to your side while in a flat bed without using bedrails?: None Help needed moving from lying on your back to sitting on the side of a flat bed without using bedrails?: None Help needed moving to and from a bed to a chair (including a wheelchair)?: None Help needed standing up from a chair using your arms (e.g., wheelchair or bedside chair)?: None Help needed to walk in hospital room?: None Help needed climbing 3-5 steps with a railing? : None 6 Click Score: 24    End of Session  Equipment Utilized During Treatment: Gait belt Activity Tolerance: Patient tolerated treatment well Patient left: in bed;with bed alarm set   PT Visit Diagnosis: Difficulty in walking, not elsewhere classified (R26.2)    Time: 1335-1350 PT Time Calculation (min) (ACUTE ONLY): 15 min   Charges:   PT Evaluation $PT Eval Low Complexity: 1 Low            [place], Sherryl Barters, PT DPT 05/17/2019, 2:04 PM

## 2019-05-17 NOTE — Discharge Summary (Signed)
[facility] NAME: [name]    MR#:  [id number]  DATE OF BIRTH:  09/16/1955  DATE OF ADMISSION:  05/16/2019 ADMITTING PHYSICIAN: Harrie Foreman, MD  DATE OF DISCHARGE: 05/17/2019   PRIMARY CARE PHYSICIAN: [name], NP    ADMISSION DIAGNOSIS:  Generalized abdominal pain [R10.84] AKI (acute kidney injury) ([place]) [N17.9] Non-intractable vomiting with nausea, unspecified vomiting type [R11.2]  DISCHARGE DIAGNOSIS:  Active Problems:   AKI (acute kidney injury) ([place])   SECONDARY DIAGNOSIS:   Past Medical History:  Diagnosis Date  . Acute encephalopathy 05/22/2018  . Allergy   . Anxiety   . Ascites   . C. difficile colitis 07/10/2015  . Cancer (HCC)    HX OF CANCER OF UTERUS   . Cirrhosis of liver not due to alcohol ([place]) 2016  . Degenerative disk disease   . Diverticulitis   . Gastroparesis   . GERD (gastroesophageal reflux disease)   . History of hiatal hernia   . Hypertension   . Hypothyroid   . Hypothyroidism due to amiodarone   . Ileus ([place]) 08/01/2015  . Intussusception intestine ([place]) 05/2015  . Orthostatic hypotension   . PAF (paroxysmal atrial fibrillation) ([place]) 03/2015   a. new onset 03/2015 in setting of intractable N/V; b. on Eliquis 5 mg bid; c. CHADSVASc 4 (DM, TIA x 2, female)  . Pancreatitis   . Pneumonia 11/14/2015  . Right ureteral stone 07/14/2016  . Sick sinus syndrome ([place])   . Stomach ulcer   . Stroke [facility])    with minimal left sided weakness  . Syncope 01/2015  . Syncope due to orthostatic hypotension 05/18/2015  . Tachyarrhythmia 01/10/2016  . TIA (transient ischemic attack) 02/2015  . Type 1 diabetes ([place])    on levemir  . UTERINE CANCER, HX OF 03/27/2007   Qualifier: Diagnosis of  By: Maxie Better FNP, Rosalita Levan   . UTI (urinary tract infection) 05/22/2018    HOSPITAL COURSE:   63 year old female with diabetes, PAF and liver cirrhosis who presented to emergency room due to back  pain. 1.  Acute kidney injury due to dehydration: Patient's creatinine is improved with IV fluids.  2.  UTI: Patient has responded well to cephalosporins.  She will be discharged on Keflex.  We asked that PCP follow-up on final urine culture.  3.  Knee pain: X-ray did not show evidence of fracture.  MRI was performed. IMPRESSION: 1. Complex medial meniscal body tear. 2. Small to moderate knee joint effusion. 3. Proximal tibial metaphyseal bone infarct, which may be Acute/subacute.  She will see Dr Roland Rack.  4.  Diabetes: Patient will continue outpatient regimen with a diet  5.  PAF: Patient's heart rate is controlled She will continue Eliquis for CVA prevention and metoprolol for heart rate control.  6.  Chronic pain: Patient sees a pain specialist  DISCHARGE CONDITIONS AND DIET:   Stable for discharge diabetic diet  CONSULTS OBTAINED:    DRUG ALLERGIES:   Allergies  Allergen Reactions  . Aspirin Rash  . Codeine Sulfate Rash  . Erythromycin Rash    Reaction:  Fever   . Prednisone Swelling  . Rosiglitazone Maleate Swelling  . Tetanus-Diphtheria Toxoids Td Rash and Other (See Comments)    Reaction:  Fever     DISCHARGE MEDICATIONS:   Allergies as of 05/17/2019      Reactions   Aspirin Rash   Codeine Sulfate Rash   Erythromycin Rash   Reaction:  Fever    Prednisone Swelling   Rosiglitazone Maleate Swelling   Tetanus-diphtheria Toxoids Td Rash, Other (See Comments)   Reaction:  Fever       Medication List    TAKE these medications   albuterol 108 (90 Base) MCG/ACT inhaler Commonly known as: VENTOLIN HFA Inhale 2 puffs into the lungs every 6 (six) hours as needed for wheezing or shortness of breath.   apixaban 5 MG Tabs tablet Commonly known as: ELIQUIS Take 5 mg by mouth 2 (two) times daily.   atorvastatin 40 MG tablet Commonly known as: LIPITOR TAKE ONE TABLET BY MOUTH EVERY DAY FOR CHOLESTEROL   cephALEXin 250 MG capsule Commonly known as: KEFLEX Take  1 capsule (250 mg total) by mouth 2 (two) times daily for 5 days.   cyclobenzaprine 10 MG tablet Commonly known as: FLEXERIL Take 1 tablet (10 mg total) by mouth 3 (three) times daily as needed for muscle spasms. Must last 30 days.   Insulin Detemir 100 UNIT/ML Pen Commonly known as: LEVEMIR Inject 30 units in the morning and 30 units at bedtime.   metoprolol succinate 25 MG 24 hr tablet Commonly known as: TOPROL-XL Take 1 tablet (25 mg total) by mouth daily.   midodrine 10 MG tablet Commonly known as: PROAMATINE Take 1 tablet (10 mg total) by mouth 3 (three) times daily.   ondansetron 4 MG tablet Commonly known as: ZOFRAN Take 1 tablet (4 mg total) by mouth every 8 (eight) hours as needed for nausea or vomiting.   oxyCODONE 5 MG immediate release tablet Commonly known as: Oxy IR/ROXICODONE Take 1 tablet (5 mg total) by mouth every 4 (four) hours as needed for severe pain. Must last 30 days. Max: 6/day What changed: Another medication with the same name was removed. Continue taking this medication, and follow the directions you see here.         Today   CHIEF COMPLAINT:   Reports that she fell and hurt her knee is painful   VITAL SIGNS:  Blood pressure 132/68, pulse 90, temperature 98.4 F (36.9 C), temperature source Oral, resp. rate 20, height 5' 3"  (1.6 m), weight 69.9 kg, SpO2 100 %.   REVIEW OF SYSTEMS:  Review of Systems  Constitutional: Negative.  Negative for chills, fever and malaise/fatigue.  HENT: Negative.  Negative for ear discharge, ear pain, hearing loss, nosebleeds and sore throat.   Eyes: Negative.  Negative for blurred vision and pain.  Respiratory: Negative.  Negative for cough, hemoptysis, shortness of breath and wheezing.   Cardiovascular: Negative.  Negative for chest pain, palpitations and leg swelling.  Gastrointestinal: Negative.  Negative for abdominal pain, blood in stool, diarrhea, nausea and vomiting.  Genitourinary: Negative.   Negative for dysuria.  Musculoskeletal: Positive for back pain, falls and joint pain.  Skin: Negative.   Neurological: Negative for dizziness, tremors, speech change, focal weakness, seizures and headaches.  Endo/Heme/Allergies: Negative.  Does not bruise/bleed easily.  Psychiatric/Behavioral: Negative.  Negative for depression, hallucinations and suicidal ideas.     PHYSICAL EXAMINATION:  GENERAL:  63 y.o.-year-old patient lying in the bed with no acute distress.  NECK:  Supple, no jugular venous distention. No thyroid enlargement, no tenderness.  LUNGS: Normal breath sounds bilaterally, no wheezing, rales,rhonchi  No use of accessory muscles of respiration.  CARDIOVASCULAR: S1, S2 normal. No murmurs, rubs, or gallops.  ABDOMEN: Soft, non-tender, non-distended. Bowel sounds present. No organomegaly or mass.  EXTREMITIES: No pedal edema, cyanosis, or clubbing.  Knee with some swelling  no tenderness joint line able to flex and extend although with some pain PSYCHIATRIC: The patient is alert and oriented x 3.  SKIN: No obvious rash, lesion, or ulcer.   DATA REVIEW:   CBC Recent Labs  Lab 05/16/19 0206  WBC 9.4  HGB 9.9*  HCT 32.1*  PLT 162    Chemistries  Recent Labs  Lab 05/16/19 0206 05/17/19 0555  NA 136 135  K 4.6 4.4  CL 107 109  CO2 15* 19*  GLUCOSE 255* 299*  BUN 25* 23  CREATININE 2.28* 1.66*  CALCIUM 8.8* 8.3*  AST 39  --   ALT 23  --   ALKPHOS 145*  --   BILITOT 0.6  --     Cardiac Enzymes No results for input(s): TROPONINI in the last 168 hours.  Microbiology Results  @MICRORSLT48 @  RADIOLOGY:  Ct Abdomen Pelvis Wo Contrast  Result Date: 05/16/2019 CLINICAL DATA:  Abdominal pain EXAM: CT ABDOMEN AND PELVIS WITHOUT CONTRAST TECHNIQUE: Multidetector CT imaging of the abdomen and pelvis was performed following the standard protocol without IV contrast. COMPARISON:  09/25/2018 FINDINGS: Lower chest: No acute abnormality. Hepatobiliary: Nodular liver  compatible with cirrhosis. Prior cholecystectomy. Pancreas: No focal abnormality or ductal dilatation. Spleen: Mildly enlarged, stable since prior study. Adrenals/Urinary Tract: Chronic mild right hydronephrosis with right ureteral stent in place, stable. No hydronephrosis on the left. Adrenal glands and urinary bladder unremarkable. Stomach/Bowel: Moderate stool throughout the colon, particularly right colon and transverse colon. Normal appendix. No evidence of bowel obstruction. Scattered sigmoid diverticula. Stomach is decompressed, grossly unremarkable. Vascular/Lymphatic: Aortic atherosclerosis. No enlarged abdominal or pelvic lymph nodes. Reproductive: Prior hysterectomy.  No adnexal masses. Other: Insert others Musculoskeletal: No acute bony abnormality. IMPRESSION: Changes of cirrhosis.  Associated splenomegaly. No ascites currently. Chronic mild right hydronephrosis with right ureteral stent in place. Findings are unchanged. Aortic atherosclerosis. Electronically Signed   By: Rolm Baptise M.D.   On: 05/16/2019 03:24   US Renal  Result Date: 05/16/2019 CLINICAL DATA:  Hydronephrosis.  Chronic urinary tract infection. EXAM: RENAL / URINARY TRACT ULTRASOUND COMPLETE COMPARISON:  Noncontrast CT on 05/16/2019 FINDINGS: Right Kidney: Renal measurements: 9.5 x 5.1 x 4.7 cm = volume: 120 mL. Increased renal parenchymal echogenicity. No mass identified. Mild pelvicaliectasis and right ureteral stent are better demonstrated on recent CT. Left Kidney: Renal measurements: 11.1 x 6.5 x 5.3 cm = volume: 199 mL. Echogenicity within normal limits. No mass or hydronephrosis visualized. Bladder: Appears normal for degree of bladder distention. Distal portion of ureteral stent is seen within the bladder. IMPRESSION: Mild right renal pelvicaliectasis, with right ureteral stent in place. Diffuse right renal parenchymal atrophy and increased echogenicity. Normal appearance of left kidney. Electronically Signed   By: Marlaine Hind M.D.   On: 05/16/2019 08:54   Dg Knee Complete 4 Views Left  Result Date: 05/17/2019 CLINICAL DATA:  Pt reports left knee pain x 3 wks. NKI. Hx of arthritis. EXAM: LEFT KNEE - COMPLETE 4+ VIEW COMPARISON:  04/10/2019 FINDINGS: There is no acute fracture or subluxation. Trace joint effusion noted. Note is made of infarct in the proximal tibia, a benign finding. IMPRESSION: 1. Trace joint effusion. 2. No evidence for acute abnormality. Electronically Signed   By: Nolon Nations M.D.   On: 05/17/2019 09:37      Allergies as of 05/17/2019      Reactions   Aspirin Rash   Codeine Sulfate Rash   Erythromycin Rash   Reaction:  Fever    Prednisone  Swelling   Rosiglitazone Maleate Swelling   Tetanus-diphtheria Toxoids Td Rash, Other (See Comments)   Reaction:  Fever       Medication List    TAKE these medications   albuterol 108 (90 Base) MCG/ACT inhaler Commonly known as: VENTOLIN HFA Inhale 2 puffs into the lungs every 6 (six) hours as needed for wheezing or shortness of breath.   apixaban 5 MG Tabs tablet Commonly known as: ELIQUIS Take 5 mg by mouth 2 (two) times daily.   atorvastatin 40 MG tablet Commonly known as: LIPITOR TAKE ONE TABLET BY MOUTH EVERY DAY FOR CHOLESTEROL   cephALEXin 250 MG capsule Commonly known as: KEFLEX Take 1 capsule (250 mg total) by mouth 2 (two) times daily for 5 days.   cyclobenzaprine 10 MG tablet Commonly known as: FLEXERIL Take 1 tablet (10 mg total) by mouth 3 (three) times daily as needed for muscle spasms. Must last 30 days.   Insulin Detemir 100 UNIT/ML Pen Commonly known as: LEVEMIR Inject 30 units in the morning and 30 units at bedtime.   metoprolol succinate 25 MG 24 hr tablet Commonly known as: TOPROL-XL Take 1 tablet (25 mg total) by mouth daily.   midodrine 10 MG tablet Commonly known as: PROAMATINE Take 1 tablet (10 mg total) by mouth 3 (three) times daily.   ondansetron 4 MG tablet Commonly known as: ZOFRAN Take 1  tablet (4 mg total) by mouth every 8 (eight) hours as needed for nausea or vomiting.   oxyCODONE 5 MG immediate release tablet Commonly known as: Oxy IR/ROXICODONE Take 1 tablet (5 mg total) by mouth every 4 (four) hours as needed for severe pain. Must last 30 days. Max: 6/day What changed: Another medication with the same name was removed. Continue taking this medication, and follow the directions you see here.         Management plans discussed with the patient and she is in agreement. Stable for discharge   Patient should follow up with pcp  CODE STATUS:     Code Status Orders  (From admission, onward)         Start     Ordered   05/16/19 0633  Full code  Continuous     05/16/19 0632        Code Status History    Date Active Date Inactive Code Status Order ID Comments User Context   11/26/2018 2145 11/27/2018 1816 Full Code [id number]  Lance Coon, MD ED   09/25/2018 0922 09/26/2018 2028 Full Code [id number]  Salary, Avel Peace, MD Inpatient   08/18/2018 2234 08/20/2018 1923 DNR [id number]  Rise Patience, MD ED   08/18/2018 2136 08/18/2018 2234 Full Code [id number]  Rise Patience, MD ED   05/22/2018 1101 05/24/2018 1850 Full Code [id number]  Molt, [place], DO ED   12/27/2017 1245 12/30/2017 2040 Full Code [id number]  Demetrios Loll, MD Inpatient   12/15/2017 0501 12/17/2017 1854 Full Code [id number]  Harrie Foreman, MD Inpatient   08/06/2016 2036 08/09/2016 1945 Full Code [id number]  Vaughan Basta, MD ED   07/14/2016 0426 07/17/2016 1934 Full Code [id number]  Harrie Foreman, MD Inpatient   07/09/2016 0453 07/10/2016 1708 Full Code [id number]  Lance Coon, MD Inpatient   06/17/2016 2245 06/20/2016 1509 Full Code [id number]  Lance Coon, MD Inpatient   06/02/2016 0546 06/06/2016 1821 DNR [id number]  Harrie Foreman, MD Inpatient   05/12/2016 1143 05/16/2016 1638 DNR [id number]  Dustin Flock, MD Inpatient   05/12/2016 0113 05/12/2016 1143  Full Code [id number]  Harvie Bridge, DO Inpatient   04/07/2016 0618 04/09/2016 1419 DNR [id number]  Harrie Foreman, MD Inpatient   03/13/2016 2125 03/16/2016 1832 DNR [id number]  Lance Coon, MD Inpatient   02/04/2016 0939 02/07/2016 1459 DNR [id number]  Saundra Shelling, MD Inpatient   01/21/2016 2224 01/25/2016 1702 DNR [id number]  Lance Coon, MD Inpatient   01/15/2016 1339 01/19/2016 1529 DNR [id number]  Gladstone Lighter, MD ED   01/10/2016 2200 01/13/2016 1724 DNR [id number]  Harrie Foreman, MD Inpatient   12/27/2015 2302 01/01/2016 1817 DNR [id number]  Max Sane, MD Inpatient   12/16/2015 1654 12/19/2015 1652 DNR [id number]  Loletha Grayer, MD ED   11/11/2015 1136 11/16/2015 1405 Full Code [id number]  Samella Parr, NP Inpatient   10/15/2015 0123 10/16/2015 1817 Full Code [id number]  Lance Coon, MD Inpatient   10/04/2015 2019 10/08/2015 1834 Full Code [id number]  Bethena Roys, MD Inpatient   08/15/2015 0105 08/16/2015 2004 Full Code [id number]  Harrie Foreman, MD Inpatient   08/01/2015 0125 08/06/2015 1741 Full Code [id number]  Harrie Foreman, MD Inpatient   07/04/2015 0253 07/11/2015 1833 Full Code [id number]  Lytle Butte, MD ED   05/27/2015 2248 05/31/2015 1437 Full Code [id number]  Rise Patience, MD Inpatient   05/19/2015 0114 05/22/2015 1628 Full Code [id number]  Rise Patience, MD Inpatient   04/05/2015 0916 04/07/2015 1723 Full Code [id number]  Bettey Costa, MD Inpatient   04/03/2015 1231 04/05/2015 0916 Full Code [id number]  Bettey Costa, MD Inpatient   03/11/2015 1649 03/14/2015 1550 Full Code [id number]  Dustin Flock, MD Inpatient   02/15/2015 1651 02/17/2015 1433 DNR [id number]  Dustin Flock, MD Inpatient   02/15/2015 1635 02/15/2015 1651 Full Code [id number]  Dustin Flock, MD Inpatient   01/11/2015 0240 01/12/2015 1811 Full Code [id number]  Etta Quill, DO ED   Advance Care Planning Activity      TOTAL TIME TAKING CARE OF THIS PATIENT: 38 minutes.    Note: This dictation was prepared with Dragon dictation along  with smaller phrase technology. Any transcriptional errors that result from this process are unintentional.  Bettey Costa M.D on 05/17/2019 at 11:24 AM  Between 7am to 6pm - Pager - [phone] After 6pm go to [email or web address] - Proofreader  [facility]  Office  [phone]  CC: Primary care physician; [name], NP

## 2019-05-17 NOTE — Plan of Care (Signed)

## 2019-05-17 NOTE — Care Management CC44 (Signed)
Condition Code 44 Documentation Completed  Patient Details  Name: [name] MRN: [id number] Date of Birth: [birth year]   Condition Code 44 given:  Yes Patient signature on Condition Code 44 notice:  Yes Documentation of 2 MD's agreement:  Yes Code 44 added to claim:  Yes    Latanya Maudlin, RN 05/17/2019, 1:21 PM

## 2019-05-17 NOTE — Care Management Obs Status (Signed)
[place] NOTIFICATION   Patient Details  Name: [name] MRN: [id number] Date of Birth: 1956-06-12   Medicare Observation Status Notification Given:  Yes    Aurelia Gras A Cerissa Zeiger, RN 05/17/2019, 1:21 PM

## 2019-05-17 NOTE — TOC Transition Note (Signed)
Transition of Care [facility]) - CM/SW Discharge Note   Patient Details  Name: [name] MRN: [id number] Date of Birth: 1955/10/08  [facility] CM/SW Contact:  Latanya Maudlin, RN Phone Number: 05/17/2019, 11:46 AM   Clinical Narrative:  Patient to be discharged per MD order. Orders in place for home health services. Patient tells me she thinks she already has home health but when she describes the services she gets it sounds more like [facility] which the patient is with. Patient agreeable to home health if she does not already have it and would like someone who is in network. Referral placed with Corene Cornea at [facility] care. No DME needs. Spouse to transport.      Final next level of care: Home w Home Health Services Barriers to Discharge: No Barriers Identified   Patient Goals and CMS Choice   CMS Medicare.gov Compare Post Acute Care list provided to:: Patient Choice offered to / list presented to : Patient  Discharge Placement                       Discharge Plan and Services                          HH Arranged: RN, PT, Nurse's Aide [facility] Agency: [place] ([facility]) Date [facility] Agency Contacted: 05/17/19 Time [place]: [id number] Representative spoke with at [place]: [place] ([place]) Interventions     Readmission Risk Interventions Readmission Risk Prevention Plan 05/17/2019 05/16/2019 11/27/2018  Transportation Screening Complete Complete Complete  Medication Review Press photographer) Complete Complete Complete  PCP or Specialist appointment within 3-5 days of discharge Complete - Complete  HRI or Home Care Consult Complete Complete -  SW Recovery Care/Counseling Consult Patient refused - -  Palliative Care Screening Not Applicable Not Applicable -  [place] Not Applicable - -  Some recent data might be hidden

## 2019-05-17 NOTE — Progress Notes (Signed)
[name] to be D/C'd Home with home health. per MD order.  Discussed prescriptions and follow up appointments with the patient. Prescriptions given to patient, medication list explained in detail. Pt verbalized understanding.  Allergies as of 05/17/2019      Reactions   Aspirin Rash   Codeine Sulfate Rash   Erythromycin Rash   Reaction:  Fever    Prednisone Swelling   Rosiglitazone Maleate Swelling   Tetanus-diphtheria Toxoids Td Rash, Other (See Comments)   Reaction:  Fever       Medication List    TAKE these medications   albuterol 108 (90 Base) MCG/ACT inhaler Commonly known as: VENTOLIN HFA Inhale 2 puffs into the lungs every 6 (six) hours as needed for wheezing or shortness of breath.   apixaban 5 MG Tabs tablet Commonly known as: ELIQUIS Take 5 mg by mouth 2 (two) times daily.   atorvastatin 40 MG tablet Commonly known as: LIPITOR TAKE ONE TABLET BY MOUTH EVERY DAY FOR CHOLESTEROL   cephALEXin 250 MG capsule Commonly known as: KEFLEX Take 1 capsule (250 mg total) by mouth 2 (two) times daily for 5 days.   cyclobenzaprine 10 MG tablet Commonly known as: FLEXERIL Take 1 tablet (10 mg total) by mouth 3 (three) times daily as needed for muscle spasms. Must last 30 days.   Insulin Detemir 100 UNIT/ML Pen Commonly known as: LEVEMIR Inject 30 units in the morning and 30 units at bedtime.   metoprolol succinate 25 MG 24 hr tablet Commonly known as: TOPROL-XL Take 1 tablet (25 mg total) by mouth daily.   midodrine 10 MG tablet Commonly known as: PROAMATINE Take 1 tablet (10 mg total) by mouth 3 (three) times daily.   ondansetron 4 MG tablet Commonly known as: ZOFRAN Take 1 tablet (4 mg total) by mouth every 8 (eight) hours as needed for nausea or vomiting.   oxyCODONE 5 MG immediate release tablet Commonly known as: Oxy IR/ROXICODONE Take 1 tablet (5 mg total) by mouth every 4 (four) hours as needed for severe pain. Must last 30 days. Max: 6/day What changed:  Another medication with the same name was removed. Continue taking this medication, and follow the directions you see here.       Vitals:   05/17/19 0728 05/17/19 1516  BP: 132/68 140/70  Pulse: 90 92  Resp: 20 20  Temp: 98.4 F (36.9 C) 99.1 F (37.3 C)  SpO2: 100% 98%    Tele box removed and returned. Skin clean, dry and intact without evidence of skin break down, no evidence of skin tears noted. IV catheter discontinued intact. Site without signs and symptoms of complications. Dressing and pressure applied. Pt denies pain at this time. No complaints noted. Husband will transport pt home.  An After Visit Summary was printed and given to the patient. Patient escorted via [place], and D/C home via private auto.  Rolley Sims

## 2019-05-20 ENCOUNTER — Telehealth: Payer: Self-pay

## 2019-05-20 NOTE — Telephone Encounter (Signed)
Transition Care Management Follow-up Telephone Call   Date discharged?05/17/19   How have you been since you were released from the hospital? Not doing well at all, memory is not very well at all, I hit my head and my vision has been a little blurry.  Also, I am having a lot of pain to my knee and my nausea is not any better with the zofran.  Not drinking or eating very much.     Do you understand why you were in the hospital? Yes   Do you understand the discharge instructions? Yes   Where were you discharged to? Home   Items Reviewed:  Medications reviewed: yes  Allergies reviewed: Yes  Dietary changes reviewed: No changes  Referrals reviewed: patient was not aware that she had been referred to Dr. Roland Rack to follow up on her knee.     Functional Questionnaire:   Activities of Daily Living (ADLs):   She states they are independent in the following: States she is independent with all ADLS but her husband is helping her right now with meal prep.  States they require assistance with the following: No assistance at this time   Any transportation issues/concerns?: None   Any patient concerns? (please see above and separate note in this encounter).    Confirmed importance and date/time of follow-up visits scheduled Yes  Provider Appointment booked with [name], NP for 05/25/19 at 1020am.   Confirmed with patient if condition begins to worsen call PCP or go to the ER.  Patient was given the office number and encouraged to call back with question or concerns. Yes

## 2019-05-20 NOTE — Telephone Encounter (Signed)
1.  Patient complains that her head pain, memory loss and visual changes seem to be mildly progressing since her fall when she hit her head.  I informed her that if symptoms are progressing in this area she could have a concussion and may need a CT scan to determine if there has been any brain injury with her fall. I do not see that they checked her for this in the hospital.    I did give her warning signs for concerning head injury and instructed to go back to the ER right away if developing any these changes (altered mental status, severe pain, loss of vision or motor function, numbness/tingling etc...).    2.  Patient states that she has ongoing nausea with inability to eat very much. The pharmacist requested she try something Otc for motion sickness which hasn't helped.  I cautioned her on the use of promethazine or high doses of zofran given sedating effects in light of her head injury.  She does not want anything different for nausea at this time.   I did encourage her to increase fluid intake, monitor her sugars regularly and try either glucerna or ensure if she is unable to eat to keep her hydrated and regulate BS levels.  She is on insulin and is aware to hold her insulin if BS are dropping at or below 80 right now.    3.  Her knee pain issues - patient sees pain management and is on a prescribed pain regimen with oxycodone and has flexeril; however, I am concerned about her taking these if she has a head injury.   I offered to move patient's appointment up to see a provider on Friday.  She refuses, and wants to see her PCP Monday when she returns.   I will forward this to PCP and also to another covering provider in case any further recommendations should be made at this point.    Patient knows to go to the ER if any symptoms progress or if she develops signs for complications for head injury, hypoglycemia and/or dehydration.

## 2019-05-20 NOTE — Telephone Encounter (Signed)
Agree with advice given

## 2019-05-25 ENCOUNTER — Ambulatory Visit (INDEPENDENT_AMBULATORY_CARE_PROVIDER_SITE_OTHER): Payer: Medicare Other | Admitting: Primary Care

## 2019-05-25 ENCOUNTER — Encounter: Payer: Self-pay | Admitting: Primary Care

## 2019-05-25 ENCOUNTER — Other Ambulatory Visit: Payer: Self-pay

## 2019-05-25 VITALS — BP 126/82 | HR 125 | Temp 98.6°F | Ht 63.0 in | Wt 155.2 lb

## 2019-05-25 DIAGNOSIS — S83242A Other tear of medial meniscus, current injury, left knee, initial encounter: Secondary | ICD-10-CM

## 2019-05-25 DIAGNOSIS — D649 Anemia, unspecified: Secondary | ICD-10-CM | POA: Diagnosis not present

## 2019-05-25 DIAGNOSIS — N179 Acute kidney failure, unspecified: Secondary | ICD-10-CM | POA: Diagnosis not present

## 2019-05-25 DIAGNOSIS — K5900 Constipation, unspecified: Secondary | ICD-10-CM

## 2019-05-25 DIAGNOSIS — R296 Repeated falls: Secondary | ICD-10-CM

## 2019-05-25 DIAGNOSIS — K5909 Other constipation: Secondary | ICD-10-CM

## 2019-05-25 DIAGNOSIS — Z09 Encounter for follow-up examination after completed treatment for conditions other than malignant neoplasm: Secondary | ICD-10-CM | POA: Diagnosis not present

## 2019-05-25 DIAGNOSIS — N3000 Acute cystitis without hematuria: Secondary | ICD-10-CM

## 2019-05-25 DIAGNOSIS — E1165 Type 2 diabetes mellitus with hyperglycemia: Secondary | ICD-10-CM

## 2019-05-25 LAB — CBC
HCT: 29.7 % — ABNORMAL LOW (ref 36.0–46.0)
Hemoglobin: 9.5 g/dL — ABNORMAL LOW (ref 12.0–15.0)
MCHC: 31.9 g/dL (ref 30.0–36.0)
MCV: 83.7 fl (ref 78.0–100.0)
Platelets: 151 10*3/uL (ref 150.0–400.0)
RBC: 3.55 Mil/uL — ABNORMAL LOW (ref 3.87–5.11)
RDW: 20.1 % — ABNORMAL HIGH (ref 11.5–15.5)
WBC: 4.6 10*3/uL (ref 4.0–10.5)

## 2019-05-25 LAB — BASIC METABOLIC PANEL
BUN: 12 mg/dL (ref 6–23)
CO2: 25 mEq/L (ref 19–32)
Calcium: 8.4 mg/dL (ref 8.4–10.5)
Chloride: 103 mEq/L (ref 96–112)
Creatinine, Ser: 1.26 mg/dL — ABNORMAL HIGH (ref 0.40–1.20)
GFR: 42.56 mL/min — ABNORMAL LOW (ref >60.00)
Glucose, Bld: 233 mg/dL — ABNORMAL HIGH (ref 70–99)
Potassium: 4.8 mEq/L (ref 3.5–5.1)
Sodium: 136 mEq/L (ref 135–145)

## 2019-05-25 LAB — HEMOGLOBIN A1C: Hgb A1c MFr Bld: 9.7 % — ABNORMAL HIGH (ref 4.6–6.5)

## 2019-05-25 NOTE — Progress Notes (Signed)
Subjective:    Patient ID: [name], female    DOB: 1955/12/26, 63 y.o.   MRN: [id number]  HPI  [name] is a 63 year old female with a history of uncontrolled type 2 diabetes, hypertension, CAD with NSTEMI, paroxysmal atrial fibrillation, cirrhosis, medication non compliance, chronic back pain who presents today for [facility] Follow up.  She presented to [facility] ED on 05/16/19 with chief complaint of nausea, vomiting, abdominal pain, constipation for the last 2-3 weeks with a one week history of dysuria. She underwent CT abdomen/pelvis which showed changes of cirrhosis, chronic hydronephrosis with ureteral stent in place, and without acute findings. Labs showed evidence of acute cystitis and AKI so she was treated with IV antibiotics and admitted for further treatment.  During her hospital stay she underwent renal ultrasound which showed diffuse right renal parenchymal atrophy and increased echogenicity, otherwise unremarkable. She continued with IV fluid treatment which showed improvement in renal function. She underwent xray and MRI of the left knee due to fall which showed medial meniscal body tear, effusion, and proximal tibial metaphyseal bone infarct.   She was discharged home on 05/17/19 with a prescription for Keflex and recommendations for orthopedic outpatient follow up.   Since her discharge home she's doing better. Today she endorses that she's fallen 12-13 times in the last one month. She has been contacted by home health but ignored the call as she thought it was a "prank call". She will call them back today. She has no recollection of needing to meet with orthopedics for her left knee.   She denies nausea, vomiting, chest pain, dysuria. She completed her course of Keflex. She is checking her glucose several days weekly which is running 130-200 on average. She continues to report constipation, endorses that she hasn't had a bowel movement in 3-4 weeks.   Review of Systems   Constitutional: Negative for fever.  Respiratory: Negative for shortness of breath.   Cardiovascular: Negative for chest pain.  Gastrointestinal: Positive for constipation. Negative for blood in stool, nausea and vomiting.  Neurological: Negative for dizziness.       Past Medical History:  Diagnosis Date  . Acute encephalopathy 05/22/2018  . Allergy   . Anxiety   . Ascites   . C. difficile colitis 07/10/2015  . Cancer (HCC)    HX OF CANCER OF UTERUS   . Cirrhosis of liver not due to alcohol ([place]) 2016  . Degenerative disk disease   . Diverticulitis   . Gastroparesis   . GERD (gastroesophageal reflux disease)   . History of hiatal hernia   . Hypertension   . Hypothyroid   . Hypothyroidism due to amiodarone   . Ileus ([place]) 08/01/2015  . Intussusception intestine ([place]) 05/2015  . Orthostatic hypotension   . PAF (paroxysmal atrial fibrillation) ([place]) 03/2015   a. new onset 03/2015 in setting of intractable N/V; b. on Eliquis 5 mg bid; c. CHADSVASc 4 (DM, TIA x 2, female)  . Pancreatitis   . Pneumonia 11/14/2015  . Right ureteral stone 07/14/2016  . Sick sinus syndrome ([place])   . Stomach ulcer   . Stroke [facility])    with minimal left sided weakness  . Syncope 01/2015  . Syncope due to orthostatic hypotension 05/18/2015  . Tachyarrhythmia 01/10/2016  . TIA (transient ischemic attack) 02/2015  . Type 1 diabetes ([place])    on levemir  . UTERINE CANCER, HX OF 03/27/2007   Qualifier: Diagnosis of  By: Maxie Better FNP, Rosalita Levan   .  UTI (urinary tract infection) 05/22/2018     Social History   Socioeconomic History  . Marital status: Married    Spouse name: Tiwatope Emmitt   . Number of children: Not on file  . Years of education: Not on file  . Highest education level: Not on file  Occupational History  . Occupation: Disabled 2nd back problems  Social Needs  . Financial resource strain: Not hard at all  . Food insecurity    Worry: Never true    Inability: Never true  .  Transportation needs    Medical: No    Non-medical: No  Tobacco Use  . Smoking status: Former Smoker    Types: Cigarettes  . Smokeless tobacco: Never Used  . Tobacco comment: 25 years ago and only smoked occasionally  Substance and Sexual Activity  . Alcohol use: No  . Drug use: No  . Sexual activity: Not Currently  Lifestyle  . Physical activity    Days per week: 0 days    Minutes per session: 0 min  . Stress: Not at all  Relationships  . Social Herbalist on phone: Patient refused    Gets together: Patient refused    Attends religious service: Patient refused    Active member of club or organization: Patient refused    Attends meetings of clubs or organizations: Patient refused    Relationship status: Patient refused  . Intimate partner violence    Fear of current or ex partner: Patient refused    Emotionally abused: Patient refused    Physically abused: Patient refused    Forced sexual activity: Patient refused  Other Topics Concern  . Not on file  Social History Narrative   Lives in [place], [place] with her husband and 2 sons.    Past Surgical History:  Procedure Laterality Date  . ABDOMINAL HYSTERECTOMY    . CARDIAC CATHETERIZATION N/A 01/12/2016   Procedure: Left Heart Cath and Coronary Angiography;  Surgeon: Wellington Hampshire, MD;  Location: [place] [facility];  Service: Cardiovascular;  Laterality: N/A;  . CHOLECYSTECTOMY    . CYSTOSCOPY/URETEROSCOPY/HOLMIUM LASER Right 07/14/2016   Procedure: CYSTOSCOPY/URETEROSCOPY/HOLMIUM LASER;  Surgeon: Alexis Frock, MD;  Location: [facility] ORS;  Service: Urology;  Laterality: Right;  . ESOPHAGOGASTRODUODENOSCOPY N/A 04/04/2015   Procedure: ESOPHAGOGASTRODUODENOSCOPY (EGD);  Surgeon: Hulen Luster, MD;  Location: [facility] ENDOSCOPY;  Service: Endoscopy;  Laterality: N/A;  . ESOPHAGOGASTRODUODENOSCOPY N/A 12/28/2017   Procedure: ESOPHAGOGASTRODUODENOSCOPY (EGD);  Surgeon: Lin Landsman, MD;  Location: [facility] ENDOSCOPY;   Service: Gastroenterology;  Laterality: N/A;  . ESOPHAGOGASTRODUODENOSCOPY (EGD) WITH PROPOFOL N/A 01/18/2016   Procedure: ESOPHAGOGASTRODUODENOSCOPY (EGD) WITH PROPOFOL;  Surgeon: Lucilla Lame, MD;  Location: [facility] ENDOSCOPY;  Service: Endoscopy;  Laterality: N/A;  . FLEXIBLE SIGMOIDOSCOPY N/A 01/18/2016   Procedure: FLEXIBLE SIGMOIDOSCOPY;  Surgeon: Lucilla Lame, MD;  Location: [facility] ENDOSCOPY;  Service: Endoscopy;  Laterality: N/A;  . HERNIA REPAIR      Family History  Problem Relation Age of Onset  . Hypertension Mother   . CAD Sister   . Heart attack Sister        Deceased 11-20-14  . CAD Brother     Allergies  Allergen Reactions  . Aspirin Rash  . Codeine Sulfate Rash  . Erythromycin Rash    Reaction:  Fever   . Prednisone Swelling  . Rosiglitazone Maleate Swelling  . Tetanus-Diphtheria Toxoids Td Rash and Other (See Comments)    Reaction:  Fever     Current Outpatient Medications  on File Prior to Visit  Medication Sig Dispense Refill  . albuterol (PROVENTIL HFA;VENTOLIN HFA) 108 (90 Base) MCG/ACT inhaler Inhale 2 puffs into the lungs every 6 (six) hours as needed for wheezing or shortness of breath. 1 Inhaler 2  . apixaban (ELIQUIS) 5 MG TABS tablet Take 5 mg by mouth 2 (two) times daily.    Marland Kitchen atorvastatin (LIPITOR) 40 MG tablet TAKE ONE TABLET BY MOUTH EVERY DAY FOR CHOLESTEROL 90 tablet 1  . cyclobenzaprine (FLEXERIL) 10 MG tablet Take 1 tablet (10 mg total) by mouth 3 (three) times daily as needed for muscle spasms. Must last 30 days. 90 tablet 2  . Insulin Detemir (LEVEMIR) 100 UNIT/ML Pen Inject 30 units in the morning and 30 units at bedtime. 15 mL 5  . metoprolol succinate (TOPROL-XL) 25 MG 24 hr tablet Take 1 tablet (25 mg total) by mouth daily. 90 tablet 3  . midodrine (PROAMATINE) 10 MG tablet Take 1 tablet (10 mg total) by mouth 3 (three) times daily. 90 tablet 6  . ondansetron (ZOFRAN) 4 MG tablet Take 1 tablet (4 mg total) by mouth every 8 (eight) hours as needed for  nausea or vomiting. 30 tablet 3  . oxyCODONE (OXY IR/ROXICODONE) 5 MG immediate release tablet Take 1 tablet (5 mg total) by mouth every 4 (four) hours as needed for severe pain. Must last 30 days. Max: 6/day 180 tablet 0   No current facility-administered medications on file prior to visit.     BP 126/82   Pulse (!) 125   Temp 98.6 F (37 C) (Temporal)   Ht 5' 3"  (1.6 m)   Wt 155 lb 4 oz (70.4 kg)   SpO2 97%   BMI 27.50 kg/m    Objective:   Physical Exam  Constitutional: She is oriented to person, place, and time. She appears well-nourished.  Neck: Neck supple.  Cardiovascular: Normal rate and regular rhythm.  Respiratory: Effort normal and breath sounds normal.  GI: Soft. Bowel sounds are normal. There is no abdominal tenderness.  Musculoskeletal:     Comments: Ambulatory with cane  Neurological: She is alert and oriented to person, place, and time.  Skin: Skin is warm and dry.  Psychiatric: She has a normal mood and affect.           Assessment & Plan:

## 2019-05-25 NOTE — Assessment & Plan Note (Signed)
Recent hospital admission for acute cystitis and AKI. AKI suspected to be secondary to nausea and vomiting.   Encouraged good intake of liquids.  Repeat BMP pending.

## 2019-05-25 NOTE — Assessment & Plan Note (Signed)
Chronic and intermittent. Discussed to start with daily Colace, can increase to BID if needed. She will call in one week if no bowel movement.

## 2019-05-25 NOTE — Assessment & Plan Note (Signed)
Continued with 12-13 falls in one month. She will call back home health and will notify me if she has difficulty getting through.

## 2019-05-25 NOTE — Assessment & Plan Note (Signed)
Recent admission for UTI, treated with IV Rocephin and then oral Keflex in the outpatient setting.  She has completed the antibiotics and is doing better.  Strongly advised she work on water intake.  Urine culture reviewed and Keflex was appropriate treatment.  Hospital labs, imaging, notes reviewed.

## 2019-05-25 NOTE — Assessment & Plan Note (Signed)
Repeat CBC pending. 

## 2019-05-25 NOTE — Assessment & Plan Note (Signed)
Home glucose readings seem improved, repeat A1C pending. Continue current regimen for now.

## 2019-05-25 NOTE — Assessment & Plan Note (Signed)
Noted on MRI of the left knee from recent hospital stay. Referral placed to orthopedics.

## 2019-05-25 NOTE — Patient Instructions (Addendum)
Please call back the home health nurse and physical therapist. You need help given your recurrent falls.  You will be contacted regarding your referral to orthopedics for your knee.  Please let us know if you have not been contacted within one week.   Stop by the lab prior to leaving today. I will notify you of your results once received.   Try taking docusate sodium (Colace) 100 mg once to twice daily for constipation. Please call me in one week if you haven't had a bowel movement.  Please schedule a follow up appointment in 3 months.  It was a pleasure to see you today!

## 2019-05-27 ENCOUNTER — Other Ambulatory Visit: Payer: Self-pay | Admitting: Primary Care

## 2019-05-27 DIAGNOSIS — S83512A Sprain of anterior cruciate ligament of left knee, initial encounter: Secondary | ICD-10-CM | POA: Diagnosis not present

## 2019-05-27 DIAGNOSIS — D649 Anemia, unspecified: Secondary | ICD-10-CM

## 2019-05-28 ENCOUNTER — Telehealth: Payer: Self-pay

## 2019-05-28 NOTE — Telephone Encounter (Signed)
Approved. On average her glucose readings should be below 150 but she tends to run higher. Have her contact me if she sees readings consistently at or above 200. Also have her double check compliance and correct dosages of prescribed insulin.

## 2019-05-28 NOTE — Telephone Encounter (Signed)
[name] with [facility] left v/m requesting verbal orders for [facility] nursing plan of care 1 x a wk for 3 wks.  1 x a wk every other wk for 5 wks  And 1 PRN visit for falls or elevated BS. Pt refused a HH aide to assist with bathing. Davy Pique wants to know what the capillary blood glucose should be, so if elevated pt will contact [place] for instructions.Please advise.

## 2019-05-29 NOTE — Telephone Encounter (Signed)
Spoken and notified [name]  of [name] comments. Sonya verbalized understanding.

## 2019-06-02 ENCOUNTER — Ambulatory Visit: Payer: Medicare Other | Admitting: Nurse Practitioner

## 2019-06-03 ENCOUNTER — Ambulatory Visit: Payer: Medicare Other | Admitting: Pain Medicine

## 2019-06-04 ENCOUNTER — Telehealth: Payer: Self-pay | Admitting: Primary Care

## 2019-06-04 ENCOUNTER — Ambulatory Visit: Payer: Medicare Other | Admitting: Nurse Practitioner

## 2019-06-04 NOTE — Telephone Encounter (Signed)
Best number [phone] [name] PT  [email or web address]  Verbal approval PT 1week 1 2 week  3 1 week 4

## 2019-06-05 NOTE — Telephone Encounter (Signed)
Approved.  

## 2019-06-05 NOTE — Telephone Encounter (Signed)
Message left for Stacy to return my call.

## 2019-06-08 ENCOUNTER — Telehealth: Payer: Self-pay | Admitting: *Deleted

## 2019-06-08 NOTE — Telephone Encounter (Signed)
Patient called stating that she checked her FBS this morning and it was 110. Patient stated that she has concerns about her insulin. Patient stated the last 4-5 days that she has given herself her insulin it burns. Patient stated a knot comes up and it last about 3-4 hours. Patient stated that this has not happened previously. Patient wants to know what [name] NP thinks about this?

## 2019-06-08 NOTE — Telephone Encounter (Signed)
Pt left v/m requesting cb about pts earlier call today and pt wants to know if should make appt to see [name] NP on 06/09/19.

## 2019-06-08 NOTE — Telephone Encounter (Signed)
She rotating sites for her insulin injections? This is important.  I would most certainly avoid injecting into the area that is causing burning. If there is any signs of infection such as redness, swelling to the site and have her come in for evaluation.

## 2019-06-09 ENCOUNTER — Other Ambulatory Visit (INDEPENDENT_AMBULATORY_CARE_PROVIDER_SITE_OTHER): Payer: Medicare Other

## 2019-06-09 ENCOUNTER — Other Ambulatory Visit: Payer: Medicare Other

## 2019-06-09 DIAGNOSIS — D649 Anemia, unspecified: Secondary | ICD-10-CM | POA: Diagnosis not present

## 2019-06-09 LAB — CBC
HCT: 31.4 % — ABNORMAL LOW (ref 36.0–46.0)
Hemoglobin: 9.8 g/dL — ABNORMAL LOW (ref 12.0–15.0)
MCHC: 31.2 g/dL (ref 30.0–36.0)
MCV: 85.6 fl (ref 78.0–100.0)
Platelets: 147 10*3/uL — ABNORMAL LOW (ref 150.0–400.0)
RBC: 3.66 Mil/uL — ABNORMAL LOW (ref 3.87–5.11)
RDW: 18.8 % — ABNORMAL HIGH (ref 11.5–15.5)
WBC: 5.9 10*3/uL (ref 4.0–10.5)

## 2019-06-09 NOTE — Telephone Encounter (Signed)
Noted  

## 2019-06-09 NOTE — Progress Notes (Signed)
Virtual Visit via Video Note   This visit type was conducted due to national recommendations for restrictions regarding the COVID-19 Pandemic (e.g. social distancing) in an effort to limit this patient's exposure and mitigate transmission in our community.  Due to her co-morbid illnesses, this patient is at least at moderate risk for complications without adequate follow up.  This format is felt to be most appropriate for this patient at this time.  All issues noted in this document were discussed and addressed.  A limited physical exam was performed with this format.  Please refer to the patient's chart for her consent to telehealth for [facility].   I connected with  [name] on 06/10/19 by a video enabled telemedicine application and verified that I am speaking with the correct person using two identifiers. I discussed the limitations of evaluation and management by telemedicine. The patient expressed understanding and agreed to proceed.   Evaluation Performed:  Follow-up visit  Date:  06/10/2019   ID:  [name], [name] 1956-01-10, MRN [id number]  Patient Location:  [place] [address]   Provider location:   [facility], [place] office  PCP:  [name], NP  Cardiologist:  Patsy Baltimore   Chief Complaint:      History of Present Illness:    [name] is a 63 y.o. female who presents via audio/video conferencing for a telehealth visit today.   The patient does not symptoms concerning for COVID-19 infection (fever, chills, cough, or new SHORTNESS OF BREATH).   Patient has a past medical history of Diabetes type 2 poorly controlled Alcohol use, liver cirrhosis Uterine cancer Chronic pain gastroparesis Paroxysmal atrial fibrillation on anticoagulation Type 1 diabetes Presenting  for syncope, sinus tachycardia tachycardia, "atrial fibrillation"  When asked how she is doing today" "not very good", losing  memory Has a caretaker, advanced home , physical therapy also coming in the house to work with her  Takes midodrine TID Not sure if it is helping No BP numbers We did have her check her blood pressures on the phone  149/78, pulse 123 sitting position Standing 155/ 66, 125 She is tolerating both midodrine and the metoprolol 25 daily  Seen by PT samantha Not eating well, 1 meal a day Nausea better Legs weak Lots of falls which she attributes to weak legs Possibly less dizziness, no syncope on the midodrine   Previous records reviewed "03/2015 in setting of intractable N/V; b. on Eliquis 5 mg bid; c. CHADSVASc 4 (DM, TIA x 2, female)"  chronic nausea, anorexia Stomach upset now, nausea 5 days Has not eated in 3-4 days ago  "tries to drink water" "dry heaves" frequently  Weight up and down, was 155 recently, now 134? Was in 51s in April 2020 Weight 119 March 2020  Syncope "15 times"  Past 3 to 4 weeks No warning, With getting up from a standing position, always happens when standing Lives in trailer, only able to walk short distance before she will have a passout spell No episodes overnight,   Prior syncope Feb to march 2019,  Syncope preceded by ringing in ears  12/2017: tach 116 07/2016: tachy, rate 145 bpm 06/2016: rate 109 bpm 04/2016: rate 120s sinus tach 02/2016: rate 115 bpm 01/2016: rate 79 bpm  Paracentesis January 2020 Had 2 times  Echocardiogram April 2019, report reviewed personally by myself Normal ejection fraction Mild to moderate MR  Prior CV studies:   The following studies were reviewed today:    Past Medical History:  Diagnosis Date  . Acute encephalopathy 05/22/2018  . Allergy   . Anxiety   . Ascites   . C. difficile colitis 07/10/2015  . Cancer (HCC)    HX OF CANCER OF UTERUS   . Cirrhosis of liver not due to alcohol ([place]) 2016  . Degenerative disk disease   . Diverticulitis   . Gastroparesis   . GERD (gastroesophageal  reflux disease)   . History of hiatal hernia   . Hypertension   . Hypothyroid   . Hypothyroidism due to amiodarone   . Ileus ([place]) 08/01/2015  . Intussusception intestine ([place]) 05/2015  . Orthostatic hypotension   . PAF (paroxysmal atrial fibrillation) ([place]) 03/2015   a. new onset 03/2015 in setting of intractable N/V; b. on Eliquis 5 mg bid; c. CHADSVASc 4 (DM, TIA x 2, female)  . Pancreatitis   . Pneumonia 11/14/2015  . Right ureteral stone 07/14/2016  . Sepsis ([place]) 12/15/2017  . Sick sinus syndrome ([place])   . Stomach ulcer   . Stroke [facility])    with minimal left sided weakness  . Syncope 01/2015  . Syncope due to orthostatic hypotension 05/18/2015  . Tachyarrhythmia 01/10/2016  . TIA (transient ischemic attack) 02/2015  . Type 1 diabetes ([place])    on levemir  . UTERINE CANCER, HX OF 03/27/2007   Qualifier: Diagnosis of  By: Maxie Better FNP, Rosalita Levan   . UTI (urinary tract infection) 05/22/2018   Past Surgical History:  Procedure Laterality Date  . ABDOMINAL HYSTERECTOMY    . CARDIAC CATHETERIZATION N/A 01/12/2016   Procedure: Left Heart Cath and Coronary Angiography;  Surgeon: Wellington Hampshire, MD;  Location: [place] [facility];  Service: Cardiovascular;  Laterality: N/A;  . CHOLECYSTECTOMY    . CYSTOSCOPY/URETEROSCOPY/HOLMIUM LASER Right 07/14/2016   Procedure: CYSTOSCOPY/URETEROSCOPY/HOLMIUM LASER;  Surgeon: Alexis Frock, MD;  Location: [facility] ORS;  Service: Urology;  Laterality: Right;  . ESOPHAGOGASTRODUODENOSCOPY N/A 04/04/2015   Procedure: ESOPHAGOGASTRODUODENOSCOPY (EGD);  Surgeon: Hulen Luster, MD;  Location: [facility] ENDOSCOPY;  Service: Endoscopy;  Laterality: N/A;  . ESOPHAGOGASTRODUODENOSCOPY N/A 12/28/2017   Procedure: ESOPHAGOGASTRODUODENOSCOPY (EGD);  Surgeon: Lin Landsman, MD;  Location: [facility] ENDOSCOPY;  Service: Gastroenterology;  Laterality: N/A;  . ESOPHAGOGASTRODUODENOSCOPY (EGD) WITH PROPOFOL N/A 01/18/2016   Procedure: ESOPHAGOGASTRODUODENOSCOPY (EGD) WITH PROPOFOL;   Surgeon: Lucilla Lame, MD;  Location: [facility] ENDOSCOPY;  Service: Endoscopy;  Laterality: N/A;  . FLEXIBLE SIGMOIDOSCOPY N/A 01/18/2016   Procedure: FLEXIBLE SIGMOIDOSCOPY;  Surgeon: Lucilla Lame, MD;  Location: [facility] ENDOSCOPY;  Service: Endoscopy;  Laterality: N/A;  . HERNIA REPAIR        Allergies:   Aspirin, Codeine sulfate, Erythromycin, Prednisone, Rosiglitazone maleate, and Tetanus-diphtheria toxoids td   Social History   Tobacco Use  . Smoking status: Former Smoker    Types: Cigarettes  . Smokeless tobacco: Never Used  . Tobacco comment: 25 years ago and only smoked occasionally  Substance Use Topics  . Alcohol use: No  . Drug use: No     Current Outpatient Medications on File Prior to Visit  Medication Sig Dispense Refill  . albuterol (PROVENTIL HFA;VENTOLIN HFA) 108 (90 Base) MCG/ACT inhaler Inhale 2 puffs into the lungs every 6 (six) hours as needed for wheezing or shortness of breath. 1 Inhaler 2  . apixaban (ELIQUIS) 5 MG TABS tablet Take 5 mg by mouth 2 (two) times daily.    Marland Kitchen atorvastatin (LIPITOR) 40 MG  tablet TAKE ONE TABLET BY MOUTH EVERY DAY FOR CHOLESTEROL 90 tablet 1  . cyclobenzaprine (FLEXERIL) 10 MG tablet Take 1 tablet (10 mg total) by mouth 3 (three) times daily as needed for muscle spasms. Must last 30 days. 90 tablet 2  . Insulin Detemir (LEVEMIR) 100 UNIT/ML Pen Inject 30 units in the morning and 30 units at bedtime. 15 mL 5  . midodrine (PROAMATINE) 10 MG tablet Take 1 tablet (10 mg total) by mouth 3 (three) times daily. 90 tablet 6  . ondansetron (ZOFRAN) 4 MG tablet Take 1 tablet (4 mg total) by mouth every 8 (eight) hours as needed for nausea or vomiting. 30 tablet 3  . oxyCODONE (OXY IR/ROXICODONE) 5 MG immediate release tablet Take 1 tablet (5 mg total) by mouth every 4 (four) hours as needed for severe pain. Must last 30 days. Max: 6/day 180 tablet 0   No current facility-administered medications on file prior to visit.      Family Hx: The patient's  family history includes CAD in her brother and sister; Heart attack in her sister; Hypertension in her mother.  ROS:   Please see the history of present illness.    Review of Systems  Constitutional: Positive for malaise/fatigue.  HENT: Negative.   Respiratory: Negative.   Cardiovascular: Negative.   Gastrointestinal: Negative.   Musculoskeletal: Negative.   Neurological: Positive for dizziness.  Psychiatric/Behavioral: Negative.   All other systems reviewed and are negative.    Labs/Other Tests and Data Reviewed:    Recent Labs: 11/25/2018: Magnesium 1.8 05/16/2019: ALT 23; TSH 3.399 05/25/2019: BUN 12; Creatinine, Ser 1.26; Potassium 4.8; Sodium 136 06/09/2019: Hemoglobin 9.8; Platelets 147.0   Recent Lipid Panel Lab Results  Component Value Date/Time   CHOL 104 09/12/2018 10:05 AM   CHOL 182 11/12/2011 03:25 AM   TRIG 90.0 09/12/2018 10:05 AM   TRIG 149 11/12/2011 03:25 AM   HDL 43.40 09/12/2018 10:05 AM   HDL 23 (L) 11/12/2011 03:25 AM   CHOLHDL 2 09/12/2018 10:05 AM   LDLCALC 42 09/12/2018 10:05 AM   LDLCALC 129 (H) 11/12/2011 03:25 AM    Wt Readings from Last 3 Encounters:  06/10/19 154 lb (69.9 kg)  05/25/19 155 lb 4 oz (70.4 kg)  05/17/19 154 lb 3.2 oz (69.9 kg)     Exam:    Vital Signs: Vital signs may also be detailed in the HPI Ht 5' 3"  (1.6 m)   Wt 154 lb (69.9 kg)   BMI 27.28 kg/m   Wt Readings from Last 3 Encounters:  06/10/19 154 lb (69.9 kg)  05/25/19 155 lb 4 oz (70.4 kg)  05/17/19 154 lb 3.2 oz (69.9 kg)   Temp Readings from Last 3 Encounters:  05/25/19 98.6 F (37 C) (Temporal)  05/17/19 99.1 F (37.3 C) (Oral)  04/10/19 98.2 F (36.8 C) (Temporal)   BP Readings from Last 3 Encounters:  05/25/19 126/82  05/17/19 140/70  05/05/19 100/70   Pulse Readings from Last 3 Encounters:  05/25/19 (!) 125  05/17/19 92  05/05/19 (!) 66     Well nourished, well developed female in no acute distress. Constitutional:  oriented to person,  place, and time. No distress.    ASSESSMENT & PLAN:    Problem List Items Addressed This Visit      Cardiology Problems   Syncope   Relevant Medications   metoprolol succinate (TOPROL-XL) 25 MG 24 hr tablet   Hyperlipidemia with target LDL less than 100 (Chronic)   Relevant  Medications   metoprolol succinate (TOPROL-XL) 25 MG 24 hr tablet   Orthostatic hypotension   Relevant Medications   metoprolol succinate (TOPROL-XL) 25 MG 24 hr tablet     Other   Sinus tachycardia - Primary     Syncope/dizziness Recommend she continue the midodrine 10 mg 3 times daily Push fluids, need to work on her weight, appetite is poor Difficult to differentiate falls from leg weakness and syncope  Sinus tachycardia Chronically elevated heart rate, asymptomatic Typically rates in the 120s as demonstrated today even on metoprolol succinate 25 daily Recommend she increase metoprolol succinate up to 25 twice daily Given blood pressure support with midodrine we may be able to achieve metoprolol succinate 50 twice daily or 100 daily Will trend upward slowly as tolerated  Gastroparesis Reports that her nausea is improved Unclear if she is using Zofran Unclear if GI symptoms improved with better blood pressure on midodrine    COVID-19 Education: The signs and symptoms of COVID-19 were discussed with the patient and how to seek care for testing (follow up with PCP or arrange E-visit).  The importance of social distancing was discussed today.  Patient Risk:   After full review of this patients clinical status, I feel that they are at least moderate risk at this time.  Time:   Today, I have spent 25 minutes with the patient with telehealth technology discussing the cardiac and medical problems/diagnoses detailed above   Additional 10 min spent reviewing the chart prior to patient visit today   Medication Adjustments/Labs and Tests Ordered: Current medicines are reviewed at length with the  patient today.  Concerns regarding medicines are outlined above.   Tests Ordered: No tests ordered   Medication Changes: No changes made   Disposition: Follow-up in 3 months   Signed, [name], MD  [place] [facility] [address] [place] [address], [place], [place] [address]

## 2019-06-09 NOTE — Telephone Encounter (Signed)
Message left for [name] to return my call.

## 2019-06-09 NOTE — Telephone Encounter (Signed)
Gave the approval to verbal orders.

## 2019-06-09 NOTE — Telephone Encounter (Signed)
Spoken and notified patient of [name] comments. Patient does rotating sites. Patient is coming on 06/11/2019 regarding memory and wants to explain more in detail when she comes in

## 2019-06-10 ENCOUNTER — Telehealth (INDEPENDENT_AMBULATORY_CARE_PROVIDER_SITE_OTHER): Payer: Medicare Other | Admitting: Cardiovascular Disease

## 2019-06-10 ENCOUNTER — Telehealth: Payer: Self-pay | Admitting: Cardiovascular Disease

## 2019-06-10 VITALS — Ht 63.0 in | Wt 154.0 lb

## 2019-06-10 DIAGNOSIS — E785 Hyperlipidemia, unspecified: Secondary | ICD-10-CM | POA: Diagnosis not present

## 2019-06-10 DIAGNOSIS — I951 Orthostatic hypotension: Secondary | ICD-10-CM

## 2019-06-10 DIAGNOSIS — R000 Tachycardia, unspecified: Secondary | ICD-10-CM | POA: Diagnosis not present

## 2019-06-10 DIAGNOSIS — R55 Syncope and collapse: Secondary | ICD-10-CM | POA: Diagnosis not present

## 2019-06-10 MED ORDER — METOPROLOL SUCCINATE ER 25 MG PO TB24: ORAL_TABLET | ORAL | 3 refills | Status: DC

## 2019-06-10 NOTE — Telephone Encounter (Signed)
I called and spoke with the patient. I advised her Dr. Rockey Situ has recommended, per her visit today, that she take metoprolol succinate 25 mg BID. I have advised her not to take the metoprolol tartrate. She voices understanding of the above and is agreeable.

## 2019-06-10 NOTE — Telephone Encounter (Signed)
Please call to discuss Metorpolol 25 mg. States she has another Metoprolol that her PCP gave her and she is not sure which one she should take.

## 2019-06-10 NOTE — Patient Instructions (Addendum)
Medication Instructions:  - Your physician has recommended you make the following change in your medication:   1) Increase toprol (metoprolol succinate) 25 mg- take 1 tablet by mouth twice a day  If you need a refill on your cardiac medications before your next appointment, please call your pharmacy.    Lab work: No new labs needed   If you have labs (blood work) drawn today and your tests are completely normal, you will receive your results only by: Marland Kitchen MyChart Message (if you have MyChart) OR . A paper copy in the mail If you have any lab test that is abnormal or we need to change your treatment, we will call you to review the results.   Testing/Procedures: No new testing needed   Follow-Up: At [facility], you and your health needs are our priority.  As part of our continuing mission to provide you with exceptional heart care, we have created designated Provider Care Teams.  These Care Teams include your primary Cardiologist (physician) and Advanced Practice Providers (APPs -  Physician Assistants and Nurse Practitioners) who all work together to provide you with the care you need, when you need it.  . You will need a follow up appointment in 3 months .   Please call our office 2 months in advance to schedule this appointment.    . Providers on your designated Care Team:   . Murray Hodgkins, NP . Christell Faith, PA-C . Marrianne Mood, PA-C  Any Other Special Instructions Will Be Listed Below (If Applicable).  For educational health videos Log in to : [email or web address] Or : [email or web address], password : [email or web address]

## 2019-06-11 ENCOUNTER — Ambulatory Visit (INDEPENDENT_AMBULATORY_CARE_PROVIDER_SITE_OTHER): Payer: Medicare Other | Admitting: Primary Care

## 2019-06-11 ENCOUNTER — Other Ambulatory Visit: Payer: Self-pay

## 2019-06-11 ENCOUNTER — Encounter: Payer: Self-pay | Admitting: Primary Care

## 2019-06-11 VITALS — BP 126/80 | HR 125 | Temp 97.7°F | Ht 63.0 in | Wt 153.8 lb

## 2019-06-11 DIAGNOSIS — R413 Other amnesia: Secondary | ICD-10-CM | POA: Insufficient documentation

## 2019-06-11 DIAGNOSIS — F411 Generalized anxiety disorder: Secondary | ICD-10-CM

## 2019-06-11 DIAGNOSIS — R000 Tachycardia, unspecified: Secondary | ICD-10-CM | POA: Diagnosis not present

## 2019-06-11 DIAGNOSIS — I639 Cerebral infarction, unspecified: Secondary | ICD-10-CM

## 2019-06-11 MED ORDER — SERTRALINE HCL 25 MG PO TABS: 25.0000 mg | ORAL_TABLET | Freq: Every day | ORAL | 1 refills | Status: DC

## 2019-06-11 NOTE — Assessment & Plan Note (Addendum)
Tachycardia noted today.  Evaluated by cardiology yesterday, metoprolol succinate increased to 25 mg twice daily.  Continue same.

## 2019-06-11 NOTE — Assessment & Plan Note (Signed)
This has never been noted on any prior visits. Caregiver noted this 1 week ago.  Her exam does not show evidence of acute stroke, however, given her complex history it is difficult to discern.  She currently declines neurology evaluation.  I do suspect that part of her memory problems are secondary to anxiety/stress in the home.  She is also not sleeping well.  Strict emergency department precautions provided to both caregiver and patient.  Caregiver does not get the sense that she has had any other neurological changes to suggest stroke.  We will start with treatment for anxiety, closely monitor with follow-up in 6 weeks.  Caregiver will call for any acute changes.

## 2019-06-11 NOTE — Assessment & Plan Note (Signed)
It seems as though she is struggled with anxiety for years, never treated.  Based off of her caregiver's report her husband seems to be the source of most of her anxiety.  Discussed options for treatment, she currently declines therapy and would like to try medication.  Prescription for Zoloft 25 mg sent to pharmacy.  We discussed possible side effects of headache, GI upset, drowsiness, and SI/HI. If thoughts of SI/HI develop, we discussed to present to the emergency immediately. Patient verbalized understanding.   Follow up in 6 weeks for re-evaluation.

## 2019-06-11 NOTE — Patient Instructions (Signed)
Start sertraline (Zoloft) 25 mg tablets for anxiety. Take one tablet by mouth every night at bedtime.  Check your blood sugars at least twice daily as discussed. Fill out the sheets that I provided.  Please go to the hospital if you notice one sided weakness, increased dizziness, severe headache, changes in speech.  Schedule a follow up visit with me for 6 weeks for anxiety and diabetes.  It was a pleasure to see you today!

## 2019-06-11 NOTE — Assessment & Plan Note (Signed)
Does not exhibit new symptoms of stroke, however, given the complexity of her medical history and ongoing dizziness it is hard to discern.  Her caregiver is with her often and has not noticed any new symptoms of stroke.  Based off of exam today she does appear stable. It seems like some of her symptoms could be secondary to anxiety.  Recent labs reviewed.  She is compliant to statin therapy. Strict emergency department precautions provided to both patient and her caregiver.

## 2019-06-11 NOTE — Progress Notes (Signed)
Subjective:    Patient ID: [name], female    DOB: 1955/11/12, 63 y.o.   MRN: [id number]  HPI  [name] is a 63 year old female with a history of uncontrolled diabetes, paroxysmal atrial fibrillation, CVA, NSTEMI, Syncope, CAD, hepatic cirrhosis, chronic back pain, chronic tachycardia, uterine cancer who presents today with concerns of memory.  She has a caregiver with her today who is providing information for HPI along with patient.  She was evaluated by her cardiologist yesterday via phone and endorsed not feeling well, concerns for memory loss, also recurrent syncope/dizziness. She is managed on midodrine TID for treatment of syncope and dizziness, this was continued. It was also recommended she increase fluid intake/appetite. Her metoprolol succinate was increased to 25 mg twice daily with a potential goal of increase to 50-100 mg depending on symptoms.   Today she endorses compliance to her metoprolol twice daily. She does feel lightheaded today. She took her second dose of metoprolol yesterday at 4 pm.   Symptoms with decrease in memory began "a while ago" but her caregiver was notified about one week ago. She has a tough time remembering people that she's met recently. For example the home health nurse and physical therapist that come by often. She has a caregiver with her today for who she's known for 15 years, but at times has a hard time remembering her caregiver. She has to write things down so she doesn't forget.   Her caregiver mentions that the patient is under a tremendous amount of stress with her husband, thinks this is contributing to most of her symptoms. The caregiver has noticed that he doesn't seem to care for her anymore, won't help her with walking, financially strains her and will spend money on alcohol/cigarettes. He smokes constantly around her. The patient feels nervous/anxious on a daily basis and has for years. GAD 7 score of 17 today.  She does not ever  remember undergoing treatment for anxiety.  She thinks she's checking her blood sugars, doesn't remember the readings.  She does not sleep well.  She denies new onset of weakness, slurred speech, facial drooping. She has chronic dizziness.   Review of Systems  Constitutional: Negative for fever.  Respiratory: Negative for shortness of breath.   Cardiovascular: Negative for chest pain.  Musculoskeletal: Positive for arthralgias and back pain.  Neurological:       Intermittent dizziness. Denies facial drooping, unilateral weakness, headache, changes in speech.  Psychiatric/Behavioral: Positive for sleep disturbance. The patient is nervous/anxious.        See HPI       Past Medical History:  Diagnosis Date   Acute encephalopathy 05/22/2018   Allergy    Anxiety    Ascites    C. difficile colitis 07/10/2015   Cancer ([place])    HX OF CANCER OF UTERUS    Cirrhosis of liver not due to alcohol ([place]) 2016   Degenerative disk disease    Diverticulitis    Gastroparesis    GERD (gastroesophageal reflux disease)    History of hiatal hernia    Hypertension    Hypothyroid    Hypothyroidism due to amiodarone    Ileus ([place]) 08/01/2015   Intussusception intestine ([place]) 05/2015   Orthostatic hypotension    PAF (paroxysmal atrial fibrillation) ([place]) 03/2015   a. new onset 03/2015 in setting of intractable N/V; b. on Eliquis 5 mg bid; c. CHADSVASc 4 (DM, TIA x 2, female)   Pancreatitis    Pneumonia  11/14/2015   Right ureteral stone 07/14/2016   Sepsis ([place]) 12/15/2017   Sick sinus syndrome ([facility])    Stomach ulcer    Stroke ([place])    with minimal left sided weakness   Syncope 01/2015   Syncope due to orthostatic hypotension 05/18/2015   Tachyarrhythmia 01/10/2016   TIA (transient ischemic attack) 02/2015   Type 1 diabetes ([place])    on levemir   UTERINE CANCER, HX OF 03/27/2007   Qualifier: Diagnosis of  By: Maxie Better FNP, Rosalita Levan    UTI (urinary tract  infection) 05/22/2018     Social History   Socioeconomic History   Marital status: Married    Spouse name: [name]    Number of children: Not on file   Years of education: Not on file   Highest education level: Not on file  Occupational History   Occupation: Disabled 2nd back problems  Social Needs   Financial resource strain: Not hard at all   Food insecurity    Worry: Never true    Inability: Never true   Transportation needs    Medical: No    Non-medical: No  Tobacco Use   Smoking status: Former Smoker    Types: Cigarettes   Smokeless tobacco: Never Used   Tobacco comment: 25 years ago and only smoked occasionally  Substance and Sexual Activity   Alcohol use: No   Drug use: No   Sexual activity: Not Currently  Lifestyle   Physical activity    Days per week: 0 days    Minutes per session: 0 min   Stress: Not at all  Relationships   Social connections    Talks on phone: Patient refused    Gets together: Patient refused    Attends religious service: Patient refused    Active member of club or organization: Patient refused    Attends meetings of clubs or organizations: Patient refused    Relationship status: Patient refused   Intimate partner violence    Fear of current or ex partner: Patient refused    Emotionally abused: Patient refused    Physically abused: Patient refused    Forced sexual activity: Patient refused  Other Topics Concern   Not on file  Social History Narrative   Lives in [place], [place] with her husband and 2 sons.    Past Surgical History:  Procedure Laterality Date   ABDOMINAL HYSTERECTOMY     CARDIAC CATHETERIZATION N/A 01/12/2016   Procedure: Left Heart Cath and Coronary Angiography;  Surgeon: Wellington Hampshire, MD;  Location: [facility];  Service: Cardiovascular;  Laterality: N/A;   CHOLECYSTECTOMY     CYSTOSCOPY/URETEROSCOPY/HOLMIUM LASER Right 07/14/2016   Procedure: CYSTOSCOPY/URETEROSCOPY/HOLMIUM  LASER;  Surgeon: Alexis Frock, MD;  Location: [facility];  Service: Urology;  Laterality: Right;   ESOPHAGOGASTRODUODENOSCOPY N/A 04/04/2015   Procedure: ESOPHAGOGASTRODUODENOSCOPY (EGD);  Surgeon: Hulen Luster, MD;  Location: [facility] ENDOSCOPY;  Service: Endoscopy;  Laterality: N/A;   ESOPHAGOGASTRODUODENOSCOPY N/A 12/28/2017   Procedure: ESOPHAGOGASTRODUODENOSCOPY (EGD);  Surgeon: Lin Landsman, MD;  Location: [facility] ENDOSCOPY;  Service: Gastroenterology;  Laterality: N/A;   ESOPHAGOGASTRODUODENOSCOPY (EGD) WITH PROPOFOL N/A 01/18/2016   Procedure: ESOPHAGOGASTRODUODENOSCOPY (EGD) WITH PROPOFOL;  Surgeon: Lucilla Lame, MD;  Location: [facility] ENDOSCOPY;  Service: Endoscopy;  Laterality: N/A;   FLEXIBLE SIGMOIDOSCOPY N/A 01/18/2016   Procedure: FLEXIBLE SIGMOIDOSCOPY;  Surgeon: Lucilla Lame, MD;  Location: [facility] ENDOSCOPY;  Service: Endoscopy;  Laterality: N/A;   HERNIA REPAIR      Family History  Problem Relation  Age of Onset   Hypertension Mother    CAD Sister    Heart attack Sister        Deceased Nov 19, 2014   CAD Brother     Allergies  Allergen Reactions   Aspirin Rash   Codeine Sulfate Rash   Erythromycin Rash    Reaction:  Fever    Prednisone Swelling   Rosiglitazone Maleate Swelling   Tetanus-Diphtheria Toxoids Td Rash and Other (See Comments)    Reaction:  Fever     Current Outpatient Medications on File Prior to Visit  Medication Sig Dispense Refill   albuterol (PROVENTIL HFA;VENTOLIN HFA) 108 (90 Base) MCG/ACT inhaler Inhale 2 puffs into the lungs every 6 (six) hours as needed for wheezing or shortness of breath. 1 Inhaler 2   apixaban (ELIQUIS) 5 MG TABS tablet Take 5 mg by mouth 2 (two) times daily.     atorvastatin (LIPITOR) 40 MG tablet TAKE ONE TABLET BY MOUTH EVERY DAY FOR CHOLESTEROL 90 tablet 1   cyclobenzaprine (FLEXERIL) 10 MG tablet Take 1 tablet (10 mg total) by mouth 3 (three) times daily as needed for muscle spasms. Must last 30 days. 90 tablet 2    Insulin Detemir (LEVEMIR) 100 UNIT/ML Pen Inject 30 units in the morning and 30 units at bedtime. 15 mL 5   metoprolol succinate (TOPROL-XL) 25 MG 24 hr tablet Take 1 tablet (25 mg) by mouth twice daily 180 tablet 3   midodrine (PROAMATINE) 10 MG tablet Take 1 tablet (10 mg total) by mouth 3 (three) times daily. 90 tablet 6   ondansetron (ZOFRAN) 4 MG tablet Take 1 tablet (4 mg total) by mouth every 8 (eight) hours as needed for nausea or vomiting. 30 tablet 3   oxyCODONE (OXY IR/ROXICODONE) 5 MG immediate release tablet Take 1 tablet (5 mg total) by mouth every 4 (four) hours as needed for severe pain. Must last 30 days. Max: 6/day 180 tablet 0   No current facility-administered medications on file prior to visit.     BP 126/80    Pulse (!) 125    Temp 97.7 F (36.5 C) (Temporal)    Ht 5' 3"  (1.6 m)    Wt 153 lb 12 oz (69.7 kg)    SpO2 98%    BMI 27.24 kg/m    Objective:   Physical Exam  Constitutional: She appears well-nourished.  Cardiovascular: Regular rhythm.  Sinus tachycardia  Respiratory: Effort normal and breath sounds normal. She has no wheezes.  Neurological: She is alert.  Could not recite her last name, date of birth, month, date, year.   No facial drooping, changes in speech.  Weakness to bilateral upper and lower extremities to left side.  Chronic.  Skin: Skin is warm and dry.  Psychiatric: She has a normal mood and affect.           Assessment & Plan:

## 2019-07-07 ENCOUNTER — Encounter: Payer: Self-pay | Admitting: Pain Medicine

## 2019-07-08 ENCOUNTER — Ambulatory Visit: Payer: Medicare Other | Attending: Pain Medicine | Admitting: Pain Medicine

## 2019-07-08 ENCOUNTER — Other Ambulatory Visit: Payer: Self-pay | Admitting: Primary Care

## 2019-07-08 ENCOUNTER — Other Ambulatory Visit: Payer: Self-pay

## 2019-07-08 DIAGNOSIS — M7918 Myalgia, other site: Secondary | ICD-10-CM | POA: Diagnosis not present

## 2019-07-08 DIAGNOSIS — M79604 Pain in right leg: Secondary | ICD-10-CM | POA: Diagnosis not present

## 2019-07-08 DIAGNOSIS — M5442 Lumbago with sciatica, left side: Secondary | ICD-10-CM

## 2019-07-08 DIAGNOSIS — F411 Generalized anxiety disorder: Secondary | ICD-10-CM

## 2019-07-08 DIAGNOSIS — M79605 Pain in left leg: Secondary | ICD-10-CM

## 2019-07-08 DIAGNOSIS — G894 Chronic pain syndrome: Secondary | ICD-10-CM | POA: Diagnosis not present

## 2019-07-08 DIAGNOSIS — M5441 Lumbago with sciatica, right side: Secondary | ICD-10-CM

## 2019-07-08 DIAGNOSIS — M797 Fibromyalgia: Secondary | ICD-10-CM

## 2019-07-08 DIAGNOSIS — G8929 Other chronic pain: Secondary | ICD-10-CM

## 2019-07-08 MED ORDER — OXYCODONE HCL 5 MG PO TABS: 5.0000 mg | ORAL_TABLET | ORAL | 0 refills | Status: DC | PRN

## 2019-07-08 MED ORDER — CYCLOBENZAPRINE HCL 10 MG PO TABS: 10.0000 mg | ORAL_TABLET | Freq: Three times a day (TID) | ORAL | 1 refills | Status: DC | PRN

## 2019-07-08 NOTE — Progress Notes (Signed)
Pain Management Virtual Encounter Note - Virtual Visit via Telephone Telehealth (real-time audio visits between healthcare provider and patient).   Patient's Phone No. & Preferred Pharmacy:  [phone] (home); There is no such number on file (mobile).; (Preferred) [phone] No e-mail address on record  [place], [place] - [address] [place] [address] Phone: [phone] Fax: [phone]    Pre-screening note:  Our staff contacted [name] and offered her an "in person", "face-to-face" appointment versus a telephone encounter. She indicated preferring the telephone encounter, at this time.   Reason for Virtual Visit: COVID-19*  Social distancing based on CDC and AMA recommendations.   I contacted [name] on 07/08/2019 via telephone.      I clearly identified myself as Gaspar Cola, MD. I verified that I was speaking with the correct person using two identifiers (Name: [name], and date of birth: Jan 19, 1956).  Advanced Informed Consent I sought verbal advanced consent from [name] for virtual visit interactions. I informed [name] of possible security and privacy concerns, risks, and limitations associated with providing "not-in-person" medical evaluation and management services. I also informed [name] of the availability of "in-person" appointments. Finally, I informed her that there would be a charge for the virtual visit and that she could be  personally, fully or partially, financially responsible for it. [name] expressed understanding and agreed to proceed.   Historic Elements   [name] is a 63 y.o. year old, female patient evaluated today after her last encounter by our practice on 05/04/2019. [name]  has a past medical history of Acute encephalopathy (05/22/2018), Allergy, Anxiety, Ascites, C. difficile colitis (07/10/2015), Cancer ([place]), Cirrhosis of liver not due to alcohol ([place])  (2016), Degenerative disk disease, Diverticulitis, Gastroparesis, GERD (gastroesophageal reflux disease), History of hiatal hernia, Hypertension, Hypothyroid, Hypothyroidism due to amiodarone, Ileus ([place]) (08/01/2015), Intussusception intestine ([place]) (05/2015), Orthostatic hypotension, PAF (paroxysmal atrial fibrillation) ([place]) (03/2015), Pancreatitis, Pneumonia (11/14/2015), Right ureteral stone (07/14/2016), Sepsis ([place]) (12/15/2017), Sick sinus syndrome ([place]), Stomach ulcer, Stroke ([place]), Syncope (01/2015), Syncope due to orthostatic hypotension (05/18/2015), Tachyarrhythmia (01/10/2016), TIA (transient ischemic attack) (02/2015), Type 1 diabetes ([place]), UTERINE CANCER, HX OF (03/27/2007), and UTI (urinary tract infection) (05/22/2018). She also  has a past surgical history that includes Hernia repair; Abdominal hysterectomy; Cholecystectomy; Esophagogastroduodenoscopy (N/A, 04/04/2015); Cardiac catheterization (N/A, 01/12/2016); Esophagogastroduodenoscopy (egd) with propofol (N/A, 01/18/2016); Flexible sigmoidoscopy (N/A, 01/18/2016); Cystoscopy/ureteroscopy/holmium laser (Right, 07/14/2016); and Esophagogastroduodenoscopy (N/A, 12/28/2017). [name] has a current medication list which includes the following prescription(s): albuterol, apixaban, atorvastatin, cyclobenzaprine, insulin detemir, metoprolol succinate, midodrine, ondansetron, sertraline, oxycodone, oxycodone, and oxycodone. She  reports that she has quit smoking. Her smoking use included cigarettes. She has never used smokeless tobacco. She reports that she does not drink alcohol or use drugs. [name] is allergic to aspirin; codeine sulfate; erythromycin; prednisone; rosiglitazone maleate; and tetanus-diphtheria toxoids td.   HPI  Today, she is being contacted for medication management.  The patient indicates doing well with the current medication regimen. No adverse reactions or side effects reported to the medications.   Pharmacotherapy Assessment  Analgesic:  Oxycodone IR 5 mg, 1 tab PO q 4 hrs (30 mg/day of oxycodone) MME/day: 45 mg/day.   Monitoring: Pharmacotherapy: No side-effects or adverse reactions reported. [place] PMP: PDMP reviewed during this encounter.       Compliance: No problems identified. Effectiveness: Clinically acceptable. Plan: Refer to "POC".  UDS:  Summary  Date Value Ref Range Status  11/25/2018 FINAL  Final    Comment:    ==================================================================== TOX[facility]RE COMP DRUG ANALYSIS,UR ==================================================================== Test                             Result       Flag       Units Drug Present and Declared for Prescription Verification   Acetaminophen                  PRESENT      EXPECTED Drug Present not Declared for Prescription Verification   Alcohol, Ethyl                 0.297        UNEXPECTED g/dL    Sources of ethyl alcohol include alcoholic beverages or as a    fermentation product of glucose; glucose is present in this    specimen.  Interpret result with caution, as the presence of    ethyl alcohol is likely due, at least in part, to fermentation of    glucose.   Cyclobenzaprine                PRESENT      UNEXPECTED   Desmethylcyclobenzaprine       PRESENT      UNEXPECTED    Desmethylcyclobenzaprine is an expected metabolite of    cyclobenzaprine.   Salicylate                     PRESENT      UNEXPECTED   Diphenhydramine                PRESENT      UNEXPECTED Drug Absent but Declared for Prescription Verification   Ibuprofen                      Not Detected UNEXPECTED    Ibuprofen, as indicated in the declared medication list, is not    always detected even when used as directed.   Metoprolol                     Not Detected UNEXPECTED ==================================================================== Test                      Result    Flag   Units      Ref Range   Creatinine              114              mg/dL       >=20 ==================================================================== Declared Medications:  The flagging and interpretation on this report are based on the  following declared medications.  Unexpected results may arise from  inaccuracies in the declared medications.  **Note: The testing scope of this panel includes these medications:  Metoprolol  **Note: The testing scope of this panel does not include small to  moderate amounts of these reported medications:  Acetaminophen  Ibuprofen  **Note: The testing scope of this panel does not include following  reported medications:  Apixaban  Atorvastatin  Insulin (Lantus) ==================================================================== For clinical consultation, please call [phone]. ====================================================================    Laboratory Chemistry Profile (12 mo)  Renal: 05/25/2019: BUN 12; Creatinine, Ser 1.26  Lab Results  Component Value Date   GFR 42.56 (L) 05/25/2019   GFRAA 37 (L) 05/17/2019   GFRNONAA  32 (L) 05/17/2019   Hepatic: 05/16/2019: Albumin 3.3 Lab Results  Component Value Date   AST 39 05/16/2019   ALT 23 05/16/2019   Other: 11/25/2018: CRP 10.8; Sed Rate 79; Vit D, 25-Hydroxy 4.7; Vitamin B-12 546 Note: Above Lab results reviewed.  Imaging  Last 90 days:  Ct Abdomen Pelvis Wo Contrast  Result Date: 05/16/2019 CLINICAL DATA:  Abdominal pain EXAM: CT ABDOMEN AND PELVIS WITHOUT CONTRAST TECHNIQUE: Multidetector CT imaging of the abdomen and pelvis was performed following the standard protocol without IV contrast. COMPARISON:  09/25/2018 FINDINGS: Lower chest: No acute abnormality. Hepatobiliary: Nodular liver compatible with cirrhosis. Prior cholecystectomy. Pancreas: No focal abnormality or ductal dilatation. Spleen: Mildly enlarged, stable since prior study. Adrenals/Urinary Tract: Chronic mild right hydronephrosis with right ureteral stent in place, stable. No hydronephrosis  on the left. Adrenal glands and urinary bladder unremarkable. Stomach/Bowel: Moderate stool throughout the colon, particularly right colon and transverse colon. Normal appendix. No evidence of bowel obstruction. Scattered sigmoid diverticula. Stomach is decompressed, grossly unremarkable. Vascular/Lymphatic: Aortic atherosclerosis. No enlarged abdominal or pelvic lymph nodes. Reproductive: Prior hysterectomy.  No adnexal masses. Other: Insert others Musculoskeletal: No acute bony abnormality. IMPRESSION: Changes of cirrhosis.  Associated splenomegaly. No ascites currently. Chronic mild right hydronephrosis with right ureteral stent in place. Findings are unchanged. Aortic atherosclerosis. Electronically Signed   By: Rolm Baptise M.D.   On: 05/16/2019 03:24   Dg Tibia/fibula Left  Result Date: 04/10/2019 CLINICAL DATA:  Fall several weeks ago with persistent left leg pain, initial encounter EXAM: LEFT TIBIA AND FIBULA - 2 VIEW COMPARISON:  None. FINDINGS: Undisplaced distal fibular fracture is noted with associated soft tissue swelling. This is better visualized on the dedicated ankle films. IMPRESSION: Distal fibular fracture without significant displacement. Soft tissue swelling is noted. Electronically Signed   By: Inez Catalina M.D.   On: 04/10/2019 11:45   Dg Ankle Complete Left  Result Date: 04/10/2019 CLINICAL DATA:  Fall several weeks ago with persistent ankle pain, initial encounter EXAM: LEFT ANKLE COMPLETE - 3+ VIEW COMPARISON:  None. FINDINGS: Minimally displaced distal fibular fracture is noted with associated soft tissue swelling. No other fracture is seen. IMPRESSION: Minimally displaced distal fibular fracture Electronically Signed   By: Inez Catalina M.D.   On: 04/10/2019 11:45   US Renal  Result Date: 05/16/2019 CLINICAL DATA:  Hydronephrosis.  Chronic urinary tract infection. EXAM: RENAL / URINARY TRACT ULTRASOUND COMPLETE COMPARISON:  Noncontrast CT on 05/16/2019 FINDINGS: Right Kidney:  Renal measurements: 9.5 x 5.1 x 4.7 cm = volume: 120 mL. Increased renal parenchymal echogenicity. No mass identified. Mild pelvicaliectasis and right ureteral stent are better demonstrated on recent CT. Left Kidney: Renal measurements: 11.1 x 6.5 x 5.3 cm = volume: 199 mL. Echogenicity within normal limits. No mass or hydronephrosis visualized. Bladder: Appears normal for degree of bladder distention. Distal portion of ureteral stent is seen within the bladder. IMPRESSION: Mild right renal pelvicaliectasis, with right ureteral stent in place. Diffuse right renal parenchymal atrophy and increased echogenicity. Normal appearance of left kidney. Electronically Signed   By: Marlaine Hind M.D.   On: 05/16/2019 08:54   Mr Knee Left Wo Contrast  Result Date: 05/17/2019 CLINICAL DATA:  Left knee pain for 3 weeks EXAM: MRI OF THE LEFT KNEE WITHOUT CONTRAST TECHNIQUE: Multiplanar, multisequence MR imaging of the knee was performed. No intravenous contrast was administered. COMPARISON:  X-ray 05/17/2019 FINDINGS: MENISCI Medial meniscus: Complex tear of the body segment of the medial meniscus (series 11, image 16)  with radial and oblique components. Lateral meniscus:  Intact. LIGAMENTS Cruciates:  Intact ACL and PCL. Collaterals: Medial collateral ligament is intact. Lateral collateral ligament complex is intact. CARTILAGE Patellofemoral:  No chondral defect. Medial:  No chondral defect. Lateral:  No chondral defect. Joint: Small to moderate joint effusion. Minimal edema within Hoffa's fat. Popliteal Fossa:  No Baker cyst. Intact popliteus tendon. Extensor Mechanism:  Intact quadriceps tendon and patellar tendon. Bones: Proximal tibial metaphyseal bone infarct with mild surrounding marrow edema. No fracture. Other: Mild soft tissue edema about the knee. IMPRESSION: 1. Complex medial meniscal body tear. 2. Small to moderate knee joint effusion. 3. Proximal tibial metaphyseal bone infarct, which may be acute/subacute.  Electronically Signed   By: Davina Poke M.D.   On: 05/17/2019 13:01   Dg Knee Complete 4 Views Left  Result Date: 05/17/2019 CLINICAL DATA:  Pt reports left knee pain x 3 wks. NKI. Hx of arthritis. EXAM: LEFT KNEE - COMPLETE 4+ VIEW COMPARISON:  04/10/2019 FINDINGS: There is no acute fracture or subluxation. Trace joint effusion noted. Note is made of infarct in the proximal tibia, a benign finding. IMPRESSION: 1. Trace joint effusion. 2. No evidence for acute abnormality. Electronically Signed   By: Nolon Nations M.D.   On: 05/17/2019 09:37    Assessment  The primary encounter diagnosis was Chronic pain syndrome. Diagnoses of Chronic low back pain (Primary area of Pain) (Bilateral) (L>R) w/ sciatica (Bilateral), Chronic lower extremity pain (Secondary Area of Pain) (Bilateral) (L>R), Chronic musculoskeletal pain, Myofascial pain syndrome, and Fibromyalgia syndrome were also pertinent to this visit.  Plan of Care  I am having [name] start on oxyCODONE and oxyCODONE. I am also having her maintain her apixaban, albuterol, Insulin Detemir, atorvastatin, midodrine, ondansetron, metoprolol succinate, sertraline, cyclobenzaprine, and oxyCODONE.  Pharmacotherapy (Medications Ordered): Meds ordered this encounter  Medications  . cyclobenzaprine (FLEXERIL) 10 MG tablet    Sig: Take 1 tablet (10 mg total) by mouth 3 (three) times daily as needed for muscle spasms. Must last 30 days.    Dispense:  270 tablet    Refill:  1    Fill one day early if pharmacy is closed on scheduled refill date. May substitute for generic if available.  Marland Kitchen oxyCODONE (OXY IR/ROXICODONE) 5 MG immediate release tablet    Sig: Take 1 tablet (5 mg total) by mouth every 4 (four) hours as needed for severe pain. Must last 30 days. Max: 6/day    Dispense:  180 tablet    Refill:  0    Chronic Pain: STOP Act (Not applicable) Fill 1 day early if closed on refill date. Do not fill until: 07/13/2019. To last until:  08/12/2019. Avoid benzodiazepines within 8 hours of opioids  . oxyCODONE (OXY IR/ROXICODONE) 5 MG immediate release tablet    Sig: Take 1 tablet (5 mg total) by mouth every 4 (four) hours as needed for severe pain. Must last 30 days. Max: 6/day    Dispense:  180 tablet    Refill:  0    Chronic Pain: STOP Act (Not applicable) Fill 1 day early if closed on refill date. Do not fill until: 08/12/2019. To last until: 09/11/2019. Avoid benzodiazepines within 8 hours of opioids  . oxyCODONE (OXY IR/ROXICODONE) 5 MG immediate release tablet    Sig: Take 1 tablet (5 mg total) by mouth every 4 (four) hours as needed for severe pain. Must last 30 days. Max: 6/day    Dispense:  180 tablet  Refill:  0    Chronic Pain: STOP Act (Not applicable) Fill 1 day early if closed on refill date. Do not fill until: 09/11/2019. To last until: 10/11/2019. Avoid benzodiazepines within 8 hours of opioids   Orders:  No orders of the defined types were placed in this encounter.  Follow-up plan:   Return in about 13 weeks (around 10/07/2019) for (VV), (MM).      Interventional management options:  Considering:   Diagnostic bilateral lumbar facet block  Possible bilateral lumbar facet RFA  Diagnostic bilateral cervical facet block  Possible bilateral cervical facet RFA    PRN Procedures:   None at this time     Recent Visits Date Type Provider Dept  04/15/19 Office Visit Milinda Pointer, MD [facility]  Showing recent visits within past 90 days and meeting all other requirements   Today's Visits Date Type Provider Dept  07/08/19 Office Visit Milinda Pointer, MD [facility]  Showing today's visits and meeting all other requirements   Future Appointments No visits were found meeting these conditions.  Showing future appointments within next 90 days and meeting all other requirements   I discussed the assessment and treatment plan with the patient. The patient was provided an opportunity  to ask questions and all were answered. The patient agreed with the plan and demonstrated an understanding of the instructions.  Patient advised to call back or seek an in-person evaluation if the symptoms or condition worsens.  Total duration of non-face-to-face encounter: 15 minutes.  Note by: Gaspar Cola, MD Date: 07/08/2019; Time: 2:26 PM  Note: This dictation was prepared with Dragon dictation. Any transcriptional errors that may result from this process are unintentional.  Disclaimer:  * Given the special circumstances of the COVID-19 pandemic, the federal government has announced that the Office for Civil Rights (OCR) will exercise its enforcement discretion and will not impose penalties on physicians using telehealth in the event of noncompliance with regulatory requirements under the [place] and [place] (HIPAA) in connection with the good faith provision of telehealth during the CZYSA-63 national public health emergency. ([place])

## 2019-07-10 ENCOUNTER — Telehealth: Payer: Self-pay

## 2019-07-10 NOTE — Telephone Encounter (Signed)
Please find out what patient was asking. Yes, she does have chronic kidney disease but during her last kidney check in September 2020 she was improved. We will see her on November 12th as scheduled.

## 2019-07-10 NOTE — Telephone Encounter (Signed)
Patient states she spoke with her pain management doctor on 07/08/2019 and she thinks he said something about her kidneys possibly that was an issue but she could not remember, states she has memory issues. She called their office back to ask about this again but has not heard back and wonders if [name] could look into this. Patient was advised that [name] will not be back until Monday and can review this message then. Patient said that was fine.

## 2019-07-14 ENCOUNTER — Emergency Department: Payer: Medicare Other

## 2019-07-14 ENCOUNTER — Inpatient Hospital Stay
Admission: EM | Admit: 2019-07-14 | Discharge: 2019-07-18 | DRG: 092 | Disposition: A | Discharge status: Home Health | Payer: Medicare Other | Attending: Internal Medicine | Admitting: Internal Medicine

## 2019-07-14 ENCOUNTER — Other Ambulatory Visit: Payer: Self-pay

## 2019-07-14 ENCOUNTER — Encounter: Payer: Self-pay | Admitting: Emergency Medicine

## 2019-07-14 DIAGNOSIS — I129 Hypertensive chronic kidney disease with stage 1 through stage 4 chronic kidney disease, or unspecified chronic kidney disease: Secondary | ICD-10-CM | POA: Diagnosis present

## 2019-07-14 DIAGNOSIS — M6282 Rhabdomyolysis: Secondary | ICD-10-CM | POA: Diagnosis present

## 2019-07-14 DIAGNOSIS — Z888 Allergy status to other drugs, medicaments and biological substances status: Secondary | ICD-10-CM

## 2019-07-14 DIAGNOSIS — R4182 Altered mental status, unspecified: Secondary | ICD-10-CM | POA: Diagnosis present

## 2019-07-14 DIAGNOSIS — I700 Atherosclerosis of aorta: Secondary | ICD-10-CM | POA: Diagnosis not present

## 2019-07-14 DIAGNOSIS — I480 Paroxysmal atrial fibrillation: Secondary | ICD-10-CM | POA: Diagnosis not present

## 2019-07-14 DIAGNOSIS — G92 Toxic encephalopathy: Secondary | ICD-10-CM | POA: Diagnosis not present

## 2019-07-14 DIAGNOSIS — R238 Other skin changes: Secondary | ICD-10-CM | POA: Diagnosis not present

## 2019-07-14 DIAGNOSIS — K219 Gastro-esophageal reflux disease without esophagitis: Secondary | ICD-10-CM | POA: Diagnosis present

## 2019-07-14 DIAGNOSIS — Z90710 Acquired absence of both cervix and uterus: Secondary | ICD-10-CM | POA: Diagnosis not present

## 2019-07-14 DIAGNOSIS — N136 Pyonephrosis: Secondary | ICD-10-CM | POA: Diagnosis present

## 2019-07-14 DIAGNOSIS — Z960 Presence of urogenital implants: Secondary | ICD-10-CM | POA: Diagnosis present

## 2019-07-14 DIAGNOSIS — Z8711 Personal history of peptic ulcer disease: Secondary | ICD-10-CM | POA: Diagnosis not present

## 2019-07-14 DIAGNOSIS — E1100 Type 2 diabetes mellitus with hyperosmolarity without nonketotic hyperglycemic-hyperosmolar coma (NKHHC): Secondary | ICD-10-CM | POA: Diagnosis not present

## 2019-07-14 DIAGNOSIS — Z20828 Contact with and (suspected) exposure to other viral communicable diseases: Secondary | ICD-10-CM | POA: Diagnosis present

## 2019-07-14 DIAGNOSIS — R531 Weakness: Secondary | ICD-10-CM | POA: Diagnosis not present

## 2019-07-14 DIAGNOSIS — E876 Hypokalemia: Secondary | ICD-10-CM | POA: Diagnosis not present

## 2019-07-14 DIAGNOSIS — E118 Type 2 diabetes mellitus with unspecified complications: Secondary | ICD-10-CM | POA: Diagnosis not present

## 2019-07-14 DIAGNOSIS — M25561 Pain in right knee: Secondary | ICD-10-CM | POA: Diagnosis not present

## 2019-07-14 DIAGNOSIS — E1165 Type 2 diabetes mellitus with hyperglycemia: Secondary | ICD-10-CM

## 2019-07-14 DIAGNOSIS — M25462 Effusion, left knee: Secondary | ICD-10-CM | POA: Diagnosis not present

## 2019-07-14 DIAGNOSIS — Z79891 Long term (current) use of opiate analgesic: Secondary | ICD-10-CM

## 2019-07-14 DIAGNOSIS — N179 Acute kidney failure, unspecified: Secondary | ICD-10-CM | POA: Diagnosis not present

## 2019-07-14 DIAGNOSIS — Z9049 Acquired absence of other specified parts of digestive tract: Secondary | ICD-10-CM | POA: Diagnosis not present

## 2019-07-14 DIAGNOSIS — Z8249 Family history of ischemic heart disease and other diseases of the circulatory system: Secondary | ICD-10-CM

## 2019-07-14 DIAGNOSIS — N289 Disorder of kidney and ureter, unspecified: Secondary | ICD-10-CM

## 2019-07-14 DIAGNOSIS — I10 Essential (primary) hypertension: Secondary | ICD-10-CM | POA: Diagnosis not present

## 2019-07-14 DIAGNOSIS — Z87891 Personal history of nicotine dependence: Secondary | ICD-10-CM

## 2019-07-14 DIAGNOSIS — N390 Urinary tract infection, site not specified: Secondary | ICD-10-CM | POA: Diagnosis not present

## 2019-07-14 DIAGNOSIS — Z8542 Personal history of malignant neoplasm of other parts of uterus: Secondary | ICD-10-CM | POA: Diagnosis not present

## 2019-07-14 DIAGNOSIS — K7460 Unspecified cirrhosis of liver: Secondary | ICD-10-CM | POA: Diagnosis not present

## 2019-07-14 DIAGNOSIS — E1122 Type 2 diabetes mellitus with diabetic chronic kidney disease: Secondary | ICD-10-CM | POA: Diagnosis present

## 2019-07-14 DIAGNOSIS — R778 Other specified abnormalities of plasma proteins: Secondary | ICD-10-CM | POA: Diagnosis not present

## 2019-07-14 DIAGNOSIS — I69354 Hemiplegia and hemiparesis following cerebral infarction affecting left non-dominant side: Secondary | ICD-10-CM | POA: Diagnosis not present

## 2019-07-14 DIAGNOSIS — N139 Obstructive and reflux uropathy, unspecified: Secondary | ICD-10-CM | POA: Diagnosis not present

## 2019-07-14 DIAGNOSIS — R05 Cough: Secondary | ICD-10-CM | POA: Diagnosis not present

## 2019-07-14 DIAGNOSIS — R627 Adult failure to thrive: Secondary | ICD-10-CM | POA: Diagnosis present

## 2019-07-14 DIAGNOSIS — Z885 Allergy status to narcotic agent status: Secondary | ICD-10-CM

## 2019-07-14 DIAGNOSIS — Z886 Allergy status to analgesic agent status: Secondary | ICD-10-CM

## 2019-07-14 DIAGNOSIS — S8991XA Unspecified injury of right lower leg, initial encounter: Secondary | ICD-10-CM | POA: Diagnosis not present

## 2019-07-14 DIAGNOSIS — R102 Pelvic and perineal pain: Secondary | ICD-10-CM | POA: Diagnosis not present

## 2019-07-14 DIAGNOSIS — E11649 Type 2 diabetes mellitus with hypoglycemia without coma: Secondary | ICD-10-CM | POA: Diagnosis not present

## 2019-07-14 DIAGNOSIS — Z87442 Personal history of urinary calculi: Secondary | ICD-10-CM | POA: Diagnosis not present

## 2019-07-14 DIAGNOSIS — N1330 Unspecified hydronephrosis: Secondary | ICD-10-CM | POA: Diagnosis not present

## 2019-07-14 DIAGNOSIS — Z79899 Other long term (current) drug therapy: Secondary | ICD-10-CM

## 2019-07-14 DIAGNOSIS — R230 Cyanosis: Secondary | ICD-10-CM

## 2019-07-14 DIAGNOSIS — N182 Chronic kidney disease, stage 2 (mild): Secondary | ICD-10-CM | POA: Diagnosis not present

## 2019-07-14 DIAGNOSIS — Z7901 Long term (current) use of anticoagulants: Secondary | ICD-10-CM

## 2019-07-14 DIAGNOSIS — J9811 Atelectasis: Secondary | ICD-10-CM | POA: Diagnosis not present

## 2019-07-14 DIAGNOSIS — Z794 Long term (current) use of insulin: Secondary | ICD-10-CM

## 2019-07-14 DIAGNOSIS — R404 Transient alteration of awareness: Secondary | ICD-10-CM | POA: Diagnosis not present

## 2019-07-14 DIAGNOSIS — Z9181 History of falling: Secondary | ICD-10-CM

## 2019-07-14 DIAGNOSIS — R296 Repeated falls: Secondary | ICD-10-CM | POA: Diagnosis not present

## 2019-07-14 DIAGNOSIS — R059 Cough, unspecified: Secondary | ICD-10-CM

## 2019-07-14 DIAGNOSIS — R4020 Unspecified coma: Secondary | ICD-10-CM | POA: Diagnosis not present

## 2019-07-14 DIAGNOSIS — R0902 Hypoxemia: Secondary | ICD-10-CM | POA: Diagnosis not present

## 2019-07-14 DIAGNOSIS — R000 Tachycardia, unspecified: Secondary | ICD-10-CM | POA: Diagnosis not present

## 2019-07-14 LAB — CBC
HCT: 34.3 % — ABNORMAL LOW (ref 36.0–46.0)
Hemoglobin: 10.6 g/dL — ABNORMAL LOW (ref 12.0–15.0)
MCH: 26.4 pg (ref 26.0–34.0)
MCHC: 30.9 g/dL (ref 30.0–36.0)
MCV: 85.3 fL (ref 80.0–100.0)
Platelets: 194 10*3/uL (ref 150–400)
RBC: 4.02 MIL/uL (ref 3.87–5.11)
RDW: 14.7 % (ref 11.5–15.5)
WBC: 9.4 10*3/uL (ref 4.0–10.5)
nRBC: 0 % (ref 0.0–0.2)

## 2019-07-14 LAB — COMPREHENSIVE METABOLIC PANEL
ALT: 26 U/L (ref 0–44)
AST: 28 U/L (ref 15–41)
Albumin: 3.9 g/dL (ref 3.5–5.0)
Alkaline Phosphatase: 132 U/L — ABNORMAL HIGH (ref 38–126)
Anion gap: 12 (ref 5–15)
BUN: 25 mg/dL — ABNORMAL HIGH (ref 8–23)
CO2: 22 mmol/L (ref 22–32)
Calcium: 9.4 mg/dL (ref 8.9–10.3)
Chloride: 102 mmol/L (ref 98–111)
Creatinine, Ser: 2.5 mg/dL — ABNORMAL HIGH (ref 0.44–1.00)
GFR calc Af Amer: 23 mL/min — ABNORMAL LOW (ref >60)
GFR calc non Af Amer: 20 mL/min — ABNORMAL LOW (ref >60)
Glucose, Bld: 170 mg/dL — ABNORMAL HIGH (ref 70–99)
Potassium: 3.3 mmol/L — ABNORMAL LOW (ref 3.5–5.1)
Sodium: 136 mmol/L (ref 135–145)
Total Bilirubin: 1 mg/dL (ref 0.3–1.2)
Total Protein: 8 g/dL (ref 6.5–8.1)

## 2019-07-14 LAB — GLUCOSE, CAPILLARY: Glucose-Capillary: 148 mg/dL — ABNORMAL HIGH (ref 70–99)

## 2019-07-14 LAB — ACETAMINOPHEN LEVEL: Acetaminophen (Tylenol), Serum: <10 ug/mL — ABNORMAL LOW (ref 10–30)

## 2019-07-14 LAB — TROPONIN I (HIGH SENSITIVITY)
Troponin I (High Sensitivity): 21 ng/L — ABNORMAL HIGH (ref <18)
Troponin I (High Sensitivity): 20 ng/L — ABNORMAL HIGH (ref <18)

## 2019-07-14 LAB — SALICYLATE LEVEL: Salicylate Lvl: <7 mg/dL (ref 2.8–30.0)

## 2019-07-14 LAB — MAGNESIUM: Magnesium: 2.1 mg/dL (ref 1.7–2.4)

## 2019-07-14 LAB — PHOSPHORUS: Phosphorus: 4.6 mg/dL (ref 2.5–4.6)

## 2019-07-14 LAB — PROTIME-INR
INR: 1.2 (ref 0.8–1.2)
Prothrombin Time: 14.7 seconds (ref 11.4–15.2)

## 2019-07-14 LAB — LACTIC ACID, PLASMA
Lactic Acid, Venous: 1.1 mmol/L (ref 0.5–1.9)
Lactic Acid, Venous: 1.2 mmol/L (ref 0.5–1.9)

## 2019-07-14 LAB — CK: Total CK: 249 U/L — ABNORMAL HIGH (ref 38–234)

## 2019-07-14 MED ORDER — SODIUM CHLORIDE 0.9% FLUSH: 3.0000 mL | Freq: Once | INTRAVENOUS | Status: DC

## 2019-07-14 MED ORDER — BISACODYL 5 MG PO TBEC: 5.0000 mg | DELAYED_RELEASE_TABLET | Freq: Every day | ORAL | Status: DC | PRN

## 2019-07-14 MED ORDER — ACETAMINOPHEN 325 MG PO TABS
650.0000 mg | ORAL_TABLET | Freq: Four times a day (QID) | ORAL | Status: DC | PRN
  Administered 2019-07-14 – 2019-07-15 (×2): 650 mg via ORAL
  Filled 2019-07-14 (×2): qty 2

## 2019-07-14 MED ORDER — SODIUM CHLORIDE 0.9 % IV BOLUS: 1000.0000 mL | Freq: Once | INTRAVENOUS | Status: DC

## 2019-07-14 MED ORDER — MAGNESIUM CITRATE PO SOLN
1.0000 | Freq: Once | ORAL | Status: DC | PRN
  Filled 2019-07-14: qty 296

## 2019-07-14 MED ORDER — SODIUM CHLORIDE 0.9 % IV SOLN
INTRAVENOUS | Status: DC
  Administered 2019-07-14 – 2019-07-17 (×6): via INTRAVENOUS

## 2019-07-14 MED ORDER — ENOXAPARIN SODIUM 30 MG/0.3ML ~~LOC~~ SOLN
30.0000 mg | SUBCUTANEOUS | Status: DC
  Administered 2019-07-14 – 2019-07-17 (×3): 30 mg via SUBCUTANEOUS
  Filled 2019-07-14 (×3): qty 0.3

## 2019-07-14 MED ORDER — ACETAMINOPHEN 650 MG RE SUPP: 650.0000 mg | Freq: Four times a day (QID) | RECTAL | Status: DC | PRN

## 2019-07-14 MED ORDER — SENNOSIDES-DOCUSATE SODIUM 8.6-50 MG PO TABS: 1.0000 | ORAL_TABLET | Freq: Every evening | ORAL | Status: DC | PRN

## 2019-07-14 NOTE — H&P (Signed)
History and Physical   [facility] [place] @ [facility] Admission History and Physical [employer], D.O.    Patient Name: [name] MR#: [id number] Date of Birth: 02-20-1956 Date of Admission: 07/14/2019  Referring MD/NP/PA: Dr. Cherylann Banas Primary Care Physician: [name], NP  Chief Complaint:  Chief Complaint  Patient presents with  . Weakness  Please note the entire history is obtained from the patient's emergency department chart, emergency department provider. Patient's personal history is limited by altered mental status.   HPI: [name] is a 63 y.o. female with a known history of HTN, GERD, DM, PAF on Eliquis, presents to the emergency department for evaluation of AMS.  Patient was in a usual state of health until the past few weeks she has become increasingly weak. With decreased appetite, multiple falls and confusion. Patient responds "I don't know" to all questions. .  EMS/ED Course: Patient received NS. Medical admission has been requested for further management of AMS, acute encephalopathy.  Review of Systems:  Unable to obtain 2/2 confusion.    Past Medical History:  Diagnosis Date  . Acute encephalopathy 05/22/2018  . Allergy   . Anxiety   . Ascites   . C. difficile colitis 07/10/2015  . Cancer (HCC)    HX OF CANCER OF UTERUS   . Cirrhosis of liver not due to alcohol ([place]) 2016  . Degenerative disk disease   . Diverticulitis   . Gastroparesis   . GERD (gastroesophageal reflux disease)   . History of hiatal hernia   . Hypertension   . Hypothyroid   . Hypothyroidism due to amiodarone   . Ileus ([place]) 08/01/2015  . Intussusception intestine ([place]) 05/2015  . Orthostatic hypotension   . PAF (paroxysmal atrial fibrillation) ([place]) 03/2015   a. new onset 03/2015 in setting of intractable N/V; b. on Eliquis 5 mg bid; c. CHADSVASc 4 (DM, TIA x 2, female)  . Pancreatitis   . Pneumonia 11/14/2015  . Right ureteral stone 07/14/2016   . Sepsis ([place]) 12/15/2017  . Sick sinus syndrome ([place])   . Stomach ulcer   . Stroke [facility])    with minimal left sided weakness  . Syncope 01/2015  . Syncope due to orthostatic hypotension 05/18/2015  . Tachyarrhythmia 01/10/2016  . TIA (transient ischemic attack) 02/2015  . Type 1 diabetes ([place])    on levemir  . UTERINE CANCER, HX OF 03/27/2007   Qualifier: Diagnosis of  By: Maxie Better FNP, Rosalita Levan   . UTI (urinary tract infection) 05/22/2018    Past Surgical History:  Procedure Laterality Date  . ABDOMINAL HYSTERECTOMY    . CARDIAC CATHETERIZATION N/A 01/12/2016   Procedure: Left Heart Cath and Coronary Angiography;  Surgeon: Wellington Hampshire, MD;  Location: [place] [facility];  Service: Cardiovascular;  Laterality: N/A;  . CHOLECYSTECTOMY    . CYSTOSCOPY/URETEROSCOPY/HOLMIUM LASER Right 07/14/2016   Procedure: CYSTOSCOPY/URETEROSCOPY/HOLMIUM LASER;  Surgeon: Gavrielle Streck Frock, MD;  Location: [facility] ORS;  Service: Urology;  Laterality: Right;  . ESOPHAGOGASTRODUODENOSCOPY N/A 04/04/2015   Procedure: ESOPHAGOGASTRODUODENOSCOPY (EGD);  Surgeon: Hulen Luster, MD;  Location: [facility] ENDOSCOPY;  Service: Endoscopy;  Laterality: N/A;  . ESOPHAGOGASTRODUODENOSCOPY N/A 12/28/2017   Procedure: ESOPHAGOGASTRODUODENOSCOPY (EGD);  Surgeon: Lin Landsman, MD;  Location: [facility] ENDOSCOPY;  Service: Gastroenterology;  Laterality: N/A;  . ESOPHAGOGASTRODUODENOSCOPY (EGD) WITH PROPOFOL N/A 01/18/2016   Procedure: ESOPHAGOGASTRODUODENOSCOPY (EGD) WITH PROPOFOL;  Surgeon: Lucilla Lame, MD;  Location: [facility] ENDOSCOPY;  Service: Endoscopy;  Laterality: N/A;  . FLEXIBLE SIGMOIDOSCOPY N/A  01/18/2016   Procedure: FLEXIBLE SIGMOIDOSCOPY;  Surgeon: Lucilla Lame, MD;  Location: [facility] ENDOSCOPY;  Service: Endoscopy;  Laterality: N/A;  . HERNIA REPAIR       reports that she has quit smoking. Her smoking use included cigarettes. She has never used smokeless tobacco. She reports that she does not drink alcohol or use  drugs.  Allergies  Allergen Reactions  . Aspirin Rash  . Codeine Sulfate Rash  . Erythromycin Rash    Reaction:  Fever   . Prednisone Swelling  . Rosiglitazone Maleate Swelling  . Tetanus-Diphtheria Toxoids Td Rash and Other (See Comments)    Reaction:  Fever     Family History  Problem Relation Age of Onset  . Hypertension Mother   . CAD Sister   . Heart attack Sister        Deceased 11-16-2014  . CAD Brother     Prior to Admission medications   Medication Sig Start Date End Date Taking? Authorizing Provider  albuterol (PROVENTIL HFA;VENTOLIN HFA) 108 (90 Base) MCG/ACT inhaler Inhale 2 puffs into the lungs every 6 (six) hours as needed for wheezing or shortness of breath. 11/27/18  Yes Epifanio Lesches, MD  apixaban (ELIQUIS) 5 MG TABS tablet Take 5 mg by mouth 2 (two) times daily.   Yes [provider]  cyclobenzaprine (FLEXERIL) 10 MG tablet Take 1 tablet (10 mg total) by mouth 3 (three) times daily as needed for muscle spasms. Must last 30 days. 08/10/19 02/06/20 Yes Milinda Pointer, MD  Insulin Detemir (LEVEMIR) 100 UNIT/ML Pen Inject 30 units in the morning and 30 units at bedtime. Patient taking differently: Inject 25 units in the morning and 30 units at bedtime. 02/09/19  Yes [name], NP  metoprolol succinate (TOPROL-XL) 25 MG 24 hr tablet Take 1 tablet (25 mg) by mouth twice daily 06/10/19  Yes Gollan, Kathlene November, MD  oxyCODONE (OXY IR/ROXICODONE) 5 MG immediate release tablet Take 1 tablet (5 mg total) by mouth every 4 (four) hours as needed for severe pain. Must last 30 days. Max: 6/day 07/13/19 08/12/19 Yes Milinda Pointer, MD  oxyCODONE (OXY IR/ROXICODONE) 5 MG immediate release tablet Take 1 tablet (5 mg total) by mouth every 4 (four) hours as needed for severe pain. Must last 30 days. Max: 6/day 08/12/19 09/11/19 Yes Milinda Pointer, MD  oxyCODONE (OXY IR/ROXICODONE) 5 MG immediate release tablet Take 1 tablet (5 mg total) by mouth every 4 (four)  hours as needed for severe pain. Must last 30 days. Max: 6/day 09/11/19 10/11/19 Yes Milinda Pointer, MD  sertraline (ZOLOFT) 25 MG tablet TAKE 1 TABLET AT BEDTIME FOR ANXIETY 07/09/19  Yes [name], NP    Physical Exam: Vitals:   07/14/19 1615 07/14/19 1630 07/14/19 1645 07/14/19 1700  BP: 119/80 109/66 119/82 140/79  Pulse: (!) 112 (!) 111 (!) 112   Resp: 18 13 15 17   Temp:      TempSrc:      SpO2: 96% 94% 95%   Weight:      Height:        GENERAL: 63 y.o.-year-old female patient, well-developed, well-nourished lying in the bed in no acute distress.  Pleasantly confused.  HEENT: Head atraumatic, normocephalic. Pupils equal. Mucus membranes moist. NECK: Supple. No JVD. CHEST: Normal breath sounds bilaterally. No wheezing, rales, rhonchi or crackles. No use of accessory muscles of respiration.  No reproducible chest wall tenderness.  CARDIOVASCULAR: S1, S2 normal. No murmurs, rubs, or gallops. Cap refill <2 seconds. Pulses intact distally.  ABDOMEN: Soft, nondistended, nontender. No rebound, guarding, rigidity. Normoactive bowel sounds present in all four quadrants.  EXTREMITIES: No pedal edema, cyanosis, or clubbing. No calf tenderness or Homan's sign.  NEUROLOGIC: The patient is alert and oriented x 0. Follows commands, Grossly intact sensorimotor.  PSYCHIATRIC:  Normal affect, mood, thought content. SKIN: Warm, dry, and intact without obvious rash, lesion, or ulcer.    Labs on Admission:  CBC: Recent Labs  Lab 07/14/19 1509  WBC 9.4  HGB 10.6*  HCT 34.3*  MCV 85.3  PLT 518   Basic Metabolic Panel: Recent Labs  Lab 07/14/19 1509  NA 136  K 3.3*  CL 102  CO2 22  GLUCOSE 170*  BUN 25*  CREATININE 2.50*  CALCIUM 9.4   GFR: Estimated Creatinine Clearance: 20.9 mL/min (A) (by C-G formula based on SCr of 2.5 mg/dL (H)). Liver Function Tests: Recent Labs  Lab 07/14/19 1509  AST 28  ALT 26  ALKPHOS 132*  BILITOT 1.0  PROT 8.0  ALBUMIN 3.9   No  results for input(s): LIPASE, AMYLASE in the last 168 hours. No results for input(s): AMMONIA in the last 168 hours. Coagulation Profile: Recent Labs  Lab 07/14/19 1659  INR 1.2   Cardiac Enzymes: Recent Labs  Lab 07/14/19 1659  CKTOTAL 249*   BNP (last 3 results) No results for input(s): PROBNP in the last 8760 hours. HbA1C: No results for input(s): HGBA1C in the last 72 hours. CBG: Recent Labs  Lab 07/14/19 1512  GLUCAP 148*   Lipid Profile: No results for input(s): CHOL, HDL, LDLCALC, TRIG, CHOLHDL, LDLDIRECT in the last 72 hours. Thyroid Function Tests: No results for input(s): TSH, T4TOTAL, FREET4, T3FREE, THYROIDAB in the last 72 hours. Anemia Panel: No results for input(s): VITAMINB12, FOLATE, FERRITIN, TIBC, IRON, RETICCTPCT in the last 72 hours. Urine analysis:    Component Value Date/Time   COLORURINE YELLOW (A) 05/16/2019 0359   APPEARANCEUR TURBID (A) 05/16/2019 0359   APPEARANCEUR Clear 10/08/2014 0144   LABSPEC 1.016 05/16/2019 0359   LABSPEC 1.033 10/08/2014 0144   PHURINE 5.0 05/16/2019 0359   GLUCOSEU 50 (A) 05/16/2019 0359   GLUCOSEU >=500 10/08/2014 0144   HGBUR LARGE (A) 05/16/2019 0359   BILIRUBINUR NEGATIVE 05/16/2019 0359   BILIRUBINUR Negative 11/26/2018 1431   BILIRUBINUR Negative 10/08/2014 0144   KETONESUR NEGATIVE 05/16/2019 0359   PROTEINUR 100 (A) 05/16/2019 0359   UROBILINOGEN 0.2 11/26/2018 1431   UROBILINOGEN 0.2 05/27/2015 1936   NITRITE NEGATIVE 05/16/2019 0359   LEUKOCYTESUR LARGE (A) 05/16/2019 0359   LEUKOCYTESUR Negative 10/08/2014 0144   Sepsis Labs: @LABRCNTIP (procalcitonin:4,lacticidven:4) )No results found for this or any previous visit (from the past 240 hour(s)).   Radiological Exams on Admission: Dg Pelvis 1-2 Views  Result Date: 07/14/2019 CLINICAL DATA:  Bilateral pelvic and knee pain after multiple recent falls. EXAM: PELVIS - 1-2 VIEW COMPARISON:  12/15/2017 FINDINGS: A right ureteral stent is again  partially visualized terminating in the midline of the lower pelvis in the expected region of the bladder, unchanged. No acute fracture or pelvic diastasis is identified. Lumbar levoscoliosis is partially visualized. IMPRESSION: No acute osseous abnormality identified. Electronically Signed   By: Logan Bores M.D.   On: 07/14/2019 17:48   Dg Knee 2 Views Left  Result Date: 07/14/2019 CLINICAL DATA:  Knee pain after fall EXAM: LEFT KNEE - 1-2 VIEW COMPARISON:  05/17/2019 FINDINGS: No fracture or malalignment. Mild patellofemoral and medial joint space degenerative change. Trace knee effusion. Mild sclerosis in the proximal  metaphysis of the tibia, likely corresponding to MRI demonstrated bone infarct. IMPRESSION: 1. No acute osseous abnormality 2. Mild arthritis of the knee with small knee effusion Electronically Signed   By: Donavan Foil M.D.   On: 07/14/2019 17:46   Dg Knee 2 Views Right  Result Date: 07/14/2019 CLINICAL DATA:  Bilateral knee pain after fall EXAM: RIGHT KNEE - 1-2 VIEW COMPARISON:  01/08/2007 FINDINGS: No acute displaced fracture or malalignment. No significant knee effusion. Mild medial joint space degenerative change. IMPRESSION: No acute osseous abnormality Electronically Signed   By: Donavan Foil M.D.   On: 07/14/2019 17:45   Ct Head Wo Contrast  Result Date: 07/14/2019 CLINICAL DATA:  Altered level of consciousness. History of multiple falls. EXAM: CT HEAD WITHOUT CONTRAST TECHNIQUE: Contiguous axial images were obtained from the base of the skull through the vertex without intravenous contrast. COMPARISON:  Head CT 05/22/2018 FINDINGS: Brain: The ventricles are normal in size and configuration. No extra-axial fluid collections are identified. The gray-white differentiation is maintained. No CT findings for acute hemispheric infarction or intracranial hemorrhage. No mass lesions. The brainstem and cerebellum are normal. Vascular: No hyperdense vessels or obvious aneurysm. Skull:  No acute skull fracture. No bone lesion. Hyperostosis frontalis interna again noted. Sinuses/Orbits: The paranasal sinuses and mastoid air cells are clear. The globes are intact. Other: No scalp lesions, laceration or hematoma. IMPRESSION: No acute intracranial findings or mass lesion. Electronically Signed   By: Marijo Sanes M.D.   On: 07/14/2019 17:29   Dg Chest Portable 1 View  Result Date: 07/14/2019 CLINICAL DATA:  Worsening weakness. EXAM: PORTABLE CHEST 1 VIEW COMPARISON:  11/26/2018 FINDINGS: The cardiac silhouette, mediastinal and hilar contours are within normal limits given the AP projection, portable technique and low lung volumes. Mild tortuosity and calcification of the thoracic aorta. Low lung volumes with vascular crowding and streaky basilar atelectasis. No infiltrates or effusions. No pulmonary lesions. The bony thorax is intact. IMPRESSION: Low lung volumes with vascular crowding and atelectasis but no definite infiltrates or effusions. Electronically Signed   By: Marijo Sanes M.D.   On: 07/14/2019 19:19    EKG: Sinus tach @ 121bpm with normal axis and nonspecific ST-T wave changes.   Assessment/Plan  This is a 63 y.o. female with a history of  HTN, GERD, DM, PAF on Eliqui now being admitted with:  #. AMS, ?toxic/metabolic vs. Uremia? - admit inpatient - Neurochecks - Check Utox  #. Acute kidney injury  - IV fluids and repeat BMP in AM.  - Avoid nephrotoxic medications - Bladder scan and place foley catheter if evidence of urinary retention   #. Mild hypokalemia Replace orally and trend BMP   # Elevated troponin, no chest pain and nonischemic EKG. - May be 2/2 AKI - Trend trops - Aspirin  #. History of HTN, PAF - Continue metoprolol, Eliquis  #. H/o Diabetes - Accuchecks achs with RISS coverage - Heart healthy, carb controlled diet  Admission status: Inpatient, tele IV Fluids: NS Diet/Nutrition: Heart healthy carb controlled Consults called: None  DVT  Px: Eliquis and early ambulation. Code Status: Full Code  Disposition Plan: To home in 1-2 days  All the records are reviewed and case discussed with ED provider. Management plans discussed with the patient and/or family who express understanding and agree with plan of care.  Ari Engelbrecht D.O. on 07/14/2019 at 7:51 PM CC: Primary care physician; [name], NP   07/14/2019, 7:51 PM

## 2019-07-14 NOTE — ED Notes (Signed)
Per husband pt took zoloft, cyclobenzaprine, and hydrocodone this morning and maybe more but unsure.

## 2019-07-14 NOTE — ED Provider Notes (Signed)
[facility] Emergency Department Provider Note ____________________________________________   First MD Initiated Contact with Patient 07/14/19 1601     (approximate)  I have reviewed the triage vital signs and the nursing notes.   HISTORY  Chief Complaint Weakness  Level 5 caveat: History of present illness limited due to altered mental status  HPI JOURNII [name] is a 63 y.o. female with PMH as noted below who presents with increased weakness over the last few months and decreased appetite in the last few weeks, with multiple falls over the last few days.  Per the husband, the patient takes Zoloft, Flexeril, and hydrocodone and took all of them this morning.  The patient is unable to give any significant history.  Past Medical History:  Diagnosis Date  . Acute encephalopathy 05/22/2018  . Allergy   . Anxiety   . Ascites   . C. difficile colitis 07/10/2015  . Cancer (HCC)    HX OF CANCER OF UTERUS   . Cirrhosis of liver not due to alcohol ([place]) 2016  . Degenerative disk disease   . Diverticulitis   . Gastroparesis   . GERD (gastroesophageal reflux disease)   . History of hiatal hernia   . Hypertension   . Hypothyroid   . Hypothyroidism due to amiodarone   . Ileus ([place]) 08/01/2015  . Intussusception intestine ([place]) 05/2015  . Orthostatic hypotension   . PAF (paroxysmal atrial fibrillation) ([place]) 03/2015   a. new onset 03/2015 in setting of intractable N/V; b. on Eliquis 5 mg bid; c. CHADSVASc 4 (DM, TIA x 2, female)  . Pancreatitis   . Pneumonia 11/14/2015  . Right ureteral stone 07/14/2016  . Sepsis ([place]) 12/15/2017  . Sick sinus syndrome ([place])   . Stomach ulcer   . Stroke [facility])    with minimal left sided weakness  . Syncope 01/2015  . Syncope due to orthostatic hypotension 05/18/2015  . Tachyarrhythmia 01/10/2016  . TIA (transient ischemic attack) 02/2015  . Type 1 diabetes ([place])    on levemir  . UTERINE CANCER, HX OF 03/27/2007   Qualifier:  Diagnosis of  By: Maxie Better FNP, Rosalita Levan   . UTI (urinary tract infection) 05/22/2018    Patient Active Problem List   Diagnosis Date Noted  . GAD (generalized anxiety disorder) 06/11/2019  . Poor memory 06/11/2019  . Constipation 05/25/2019  . Acute medial meniscus tear of left knee 05/25/2019  . AKI (acute kidney injury) ([place]) 05/16/2019  . Closed fracture of left distal fibula 05/16/2019  . Sinus tachycardia 05/05/2019  . Congestive dilated cardiomyopathy ([place]) 04/14/2019  . CAD (coronary artery disease) 04/10/2019  . Closed fracture of lateral malleolus 04/10/2019  . Lumbar facet syndrome (Bilateral) (R>L) 03/09/2019  . Long term (current) use of anticoagulants 03/09/2019  . Unspecified cirrhosis of liver ([place]) 02/11/2019  . Hypoalbuminemia 02/11/2019  . Edema due to hypoalbuminemia 02/11/2019  . Lower extremity edema 01/26/2019  . Prolonged QT interval 12/17/2018  . Hypomagnesemia 12/17/2018  . Uncontrolled type 2 diabetes mellitus ([place]) 12/17/2018  . H/O TIA (transient ischemic attack) and stroke 12/17/2018  . Personal history of surgery to heart and great vessels, presenting hazards to health 12/17/2018  . DDD (degenerative disc disease), lumbar 12/17/2018  . Lumbar intervertebral disc displacement 12/17/2018  . Chronic musculoskeletal pain 12/17/2018  . Chronic generalized pain 12/17/2018  . Hyponatremia 12/17/2018  . Fibromyalgia syndrome 12/17/2018  . Other intervertebral disc displacement, lumbar region 12/17/2018  . Vitamin D deficiency 11/26/2018  . Elevated  C-reactive protein (CRP) 11/26/2018  . Elevated sed rate 11/26/2018  . Chronic low back pain (Primary area of Pain) (Bilateral) (L>R) w/ sciatica (Bilateral) 11/25/2018  . Chronic lower extremity pain (Secondary Area of Pain) (Bilateral) (L>R) 11/25/2018  . Chronic neck pain 11/25/2018  . Pharmacologic therapy 11/25/2018  . Disorder of skeletal system 11/25/2018  . Problems influencing health status  11/25/2018  . Renal mass 10/06/2018  . Chronic intermittent abdominal pain 09/25/2018  . Unspecified abdominal pain 09/25/2018  . Recurrent falls 09/12/2018  . Orthostatic hypotension 08/18/2018  . Normochromic normocytic anemia 08/18/2018  . CKD (chronic kidney disease), stage III 08/18/2018  . History of hypothyroidism 08/14/2018  . Medical non-compliance 08/14/2018  . Diabetic ketoacidosis ([place]) 05/22/2018  . UTI (urinary tract infection) 05/22/2018  . NSTEMI (non-ST elevated myocardial infarction) ([place]) 01/11/2016  . Tachy-brady syndrome ([place]) 12/28/2015  . PAF (paroxysmal atrial fibrillation) ([place]) 11/24/2015  . Type 2 diabetes mellitus with neurologic complication ([place]) 48/18/5631  . S/P cholecystectomy 11/23/2015  . Narcotic abuse ([place]) 11/22/2015  . Chronic superficial gastritis without bleeding 11/22/2015  . History of Clostridium difficile 11/22/2015  . History of TIA (transient ischemic attack) 11/22/2015  . Mixed hyperlipidemia 11/22/2015  . Diverticulosis 11/22/2015  . History of uterine cancer 11/22/2015  . Hypothyroidism, unspecified 11/22/2015  . Intractable cyclical vomiting with nausea 11/22/2015  . Narcotic withdrawal ([place]) 11/11/2015  . Insulin dependent type 2 diabetes mellitus ([place]) 08/31/2015  . Hyperlipidemia 08/31/2015  . Cryptogenic cirrhosis ([place]) 07/10/2015  . Atrial fibrillation ([place]) 07/10/2015  . Malnutrition of moderate degree 07/04/2015  . Symptomatic bradycardia 05/27/2015  . Chronic anemia 05/19/2015  . Thrombocytopenia ([place]) 05/19/2015  . Hypokalemia 04/06/2015  . Hyperlipidemia with target LDL less than 100 04/06/2015  . Syncope 03/11/2015  . CVA (cerebral vascular accident) ([place]) 02/15/2015  . Essential hypertension 01/12/2015  . Chronically on opiate therapy 01/12/2015  . S/P Nissen fundoplication (without gastrostomy tube) procedure 05/11/2014  . Dysphagia 04/19/2014  . DEPRESSION/ANXIETY 06/27/2007  . Myofascial pain syndrome  06/27/2007  . Chronic pain syndrome 03/28/2007  . GERD 03/27/2007  . Diverticulosis of large intestine without perforation or abscess without bleeding 03/27/2007  . Proteinuria 03/27/2007    Past Surgical History:  Procedure Laterality Date  . ABDOMINAL HYSTERECTOMY    . CARDIAC CATHETERIZATION N/A 01/12/2016   Procedure: Left Heart Cath and Coronary Angiography;  Surgeon: Wellington Hampshire, MD;  Location: [place] CV LAB;  Service: Cardiovascular;  Laterality: N/A;  . CHOLECYSTECTOMY    . CYSTOSCOPY/URETEROSCOPY/HOLMIUM LASER Right 07/14/2016   Procedure: CYSTOSCOPY/URETEROSCOPY/HOLMIUM LASER;  Surgeon: Alexis Frock, MD;  Location: [facility] ORS;  Service: Urology;  Laterality: Right;  . ESOPHAGOGASTRODUODENOSCOPY N/A 04/04/2015   Procedure: ESOPHAGOGASTRODUODENOSCOPY (EGD);  Surgeon: Hulen Luster, MD;  Location: [facility] ENDOSCOPY;  Service: Endoscopy;  Laterality: N/A;  . ESOPHAGOGASTRODUODENOSCOPY N/A 12/28/2017   Procedure: ESOPHAGOGASTRODUODENOSCOPY (EGD);  Surgeon: Lin Landsman, MD;  Location: [facility] ENDOSCOPY;  Service: Gastroenterology;  Laterality: N/A;  . ESOPHAGOGASTRODUODENOSCOPY (EGD) WITH PROPOFOL N/A 01/18/2016   Procedure: ESOPHAGOGASTRODUODENOSCOPY (EGD) WITH PROPOFOL;  Surgeon: Lucilla Lame, MD;  Location: [facility] ENDOSCOPY;  Service: Endoscopy;  Laterality: N/A;  . FLEXIBLE SIGMOIDOSCOPY N/A 01/18/2016   Procedure: FLEXIBLE SIGMOIDOSCOPY;  Surgeon: Lucilla Lame, MD;  Location: [facility] ENDOSCOPY;  Service: Endoscopy;  Laterality: N/A;  . HERNIA REPAIR      Prior to Admission medications   Medication Sig Start Date End Date Taking? Authorizing Provider  albuterol (PROVENTIL HFA;VENTOLIN HFA) 108 (90 Base) MCG/ACT inhaler Inhale 2 puffs into  the lungs every 6 (six) hours as needed for wheezing or shortness of breath. 11/27/18   Epifanio Lesches, MD  apixaban (ELIQUIS) 5 MG TABS tablet Take 5 mg by mouth 2 (two) times daily.    [provider]  atorvastatin (LIPITOR) 40 MG  tablet TAKE ONE TABLET BY MOUTH EVERY DAY FOR CHOLESTEROL 02/17/19   [name], NP  cyclobenzaprine (FLEXERIL) 10 MG tablet Take 1 tablet (10 mg total) by mouth 3 (three) times daily as needed for muscle spasms. Must last 30 days. 08/10/19 02/06/20  Milinda Pointer, MD  Insulin Detemir (LEVEMIR) 100 UNIT/ML Pen Inject 30 units in the morning and 30 units at bedtime. 02/09/19   [name], NP  metoprolol succinate (TOPROL-XL) 25 MG 24 hr tablet Take 1 tablet (25 mg) by mouth twice daily 06/10/19   Minna Merritts, MD  midodrine (PROAMATINE) 10 MG tablet Take 1 tablet (10 mg total) by mouth 3 (three) times daily. 05/05/19   Minna Merritts, MD  ondansetron (ZOFRAN) 4 MG tablet Take 1 tablet (4 mg total) by mouth every 8 (eight) hours as needed for nausea or vomiting. 05/05/19   Minna Merritts, MD  oxyCODONE (OXY IR/ROXICODONE) 5 MG immediate release tablet Take 1 tablet (5 mg total) by mouth every 4 (four) hours as needed for severe pain. Must last 30 days. Max: 6/day 07/13/19 08/12/19  Milinda Pointer, MD  oxyCODONE (OXY IR/ROXICODONE) 5 MG immediate release tablet Take 1 tablet (5 mg total) by mouth every 4 (four) hours as needed for severe pain. Must last 30 days. Max: 6/day 08/12/19 09/11/19  Milinda Pointer, MD  oxyCODONE (OXY IR/ROXICODONE) 5 MG immediate release tablet Take 1 tablet (5 mg total) by mouth every 4 (four) hours as needed for severe pain. Must last 30 days. Max: 6/day 09/11/19 10/11/19  Milinda Pointer, MD  sertraline (ZOLOFT) 25 MG tablet TAKE 1 TABLET AT BEDTIME FOR ANXIETY 07/09/19   [name], NP    Allergies Aspirin, Codeine sulfate, Erythromycin, Prednisone, Rosiglitazone maleate, and Tetanus-diphtheria toxoids td  Family History  Problem Relation Age of Onset  . Hypertension Mother   . CAD Sister   . Heart attack Sister        Deceased 2014/11/12  . CAD Brother     Social History Social History   Tobacco Use  . Smoking status: Former  Smoker    Types: Cigarettes  . Smokeless tobacco: Never Used  . Tobacco comment: 25 years ago and only smoked occasionally  Substance Use Topics  . Alcohol use: No  . Drug use: No    Review of Systems Level 5 caveat: Unable to obtain review of systems due to altered mental status   ____________________________________________   PHYSICAL EXAM:  VITAL SIGNS: ED Triage Vitals  Enc Vitals Group     BP 07/14/19 1452 (!) 105/57     Pulse Rate 07/14/19 1452 (!) 124     Resp 07/14/19 1452 16     Temp 07/14/19 1452 99.1 F (37.3 C)     Temp Source 07/14/19 1452 Oral     SpO2 07/14/19 1452 96 %     Weight 07/14/19 1458 152 lb (68.9 kg)     Height 07/14/19 1458 5' 3"  (1.6 m)     Head Circumference --      Peak Flow --      Pain Score 07/14/19 1452 7     Pain Loc --      Pain Edu? --  Excl. in [place]? --     Constitutional: Alert but somewhat sleepy appearing.  Confused.  Able to follow commands.  Answering most questions with "I do not know." Eyes: Conjunctivae are normal.  EOMI.  PERRLA. Head: Atraumatic. Nose: No congestion/rhinnorhea. Mouth/Throat: Mucous membranes are dry.   Neck: Normal range of motion.  Cardiovascular: Tachycardic, regular rhythm.  Good peripheral circulation. Respiratory: Normal respiratory effort.  No retractions.  Gastrointestinal: Soft and nontender. No distention.  Genitourinary: No flank tenderness. Musculoskeletal: No lower extremity edema.  Extremities warm and well perfused.  Pain on range of motion of bilateral hips and knees with no deformity.  Intact distal pulses in all extremities. Neurologic: Motor intact in all extremities.  Following commands. Skin:  Skin is warm and dry. No rash noted. Psychiatric: Unable to assess due to mental status.  ____________________________________________   LABS (all labs ordered are listed, but only abnormal results are displayed)  Labs Reviewed  COMPREHENSIVE METABOLIC PANEL - Abnormal; Notable for  the following components:      Result Value   Potassium 3.3 (*)    Glucose, Bld 170 (*)    BUN 25 (*)    Creatinine, Ser 2.50 (*)    Alkaline Phosphatase 132 (*)    GFR calc non Af Amer 20 (*)    GFR calc Af Amer 23 (*)    All other components within normal limits  CBC - Abnormal; Notable for the following components:   Hemoglobin 10.6 (*)    HCT 34.3 (*)    All other components within normal limits  GLUCOSE, CAPILLARY - Abnormal; Notable for the following components:   Glucose-Capillary 148 (*)    All other components within normal limits  CK - Abnormal; Notable for the following components:   Total CK 249 (*)    All other components within normal limits  ACETAMINOPHEN LEVEL - Abnormal; Notable for the following components:   Acetaminophen (Tylenol), Serum <10 (*)    All other components within normal limits  TROPONIN I (HIGH SENSITIVITY) - Abnormal; Notable for the following components:   Troponin I (High Sensitivity) 21 (*)    All other components within normal limits  TROPONIN I (HIGH SENSITIVITY) - Abnormal; Notable for the following components:   Troponin I (High Sensitivity) 20 (*)    All other components within normal limits  SARS CORONAVIRUS 2 (TAT 6-24 HRS)  PROTIME-INR  SALICYLATE LEVEL  LACTIC ACID, PLASMA  URINALYSIS, COMPLETE (UACMP) WITH MICROSCOPIC  LACTIC ACID, PLASMA  CBG MONITORING, ED   ____________________________________________  EKG  ED ECG REPORT I, Arta Silence, the attending physician, personally viewed and interpreted this ECG.  Date: 07/14/2019 EKG Time: 1454 Rate: 121 Rhythm: Sinus tachycardia QRS Axis: normal Intervals: normal ST/T Wave abnormalities: Nonspecific chronic appearing ST abnormalities Narrative Interpretation: Nonspecific abnormalities with no evidence of acute ischemia  ____________________________________________  RADIOLOGY  CXR: Pending XR pelvis: No acute fracture XR R knee: No acute fracture XR L knee:  No acute fracture CT head: No acute abnormalities  ____________________________________________   PROCEDURES  Procedure(s) performed: No  Procedures  Critical Care performed: No ____________________________________________   INITIAL IMPRESSION / ASSESSMENT AND PLAN / ED COURSE  Pertinent labs & imaging results that were available during my care of the patient were reviewed by me and considered in my medical decision making (see chart for details).  63 year old female with PMH as noted above presents with increased generalized weakness of gradual onset over at least several weeks, along with decreased appetite, and  more acutely altered mental status.  The patient is on multiple medications which could cause drowsiness.  I reviewed the past medical records in [place].  The patient was most recent admitted in September with AKI.  On exam today, the patient appears quite confused.  Apparently when she arrived the patient was able to give some history although she appeared quite sleepy.  During my exam, the patient answers "I do not know" to most questions although is following most of my commands appropriately.  She is tachycardic with a borderline temperature and otherwise normal vital signs.  Neurologic exam is nonfocal.  There is no evidence of trauma.  Overall I suspect most likely delirium related to her medications, although differential includes infection/sepsis, dehydration, other metabolic etiology, or less likely CNS cause.  Given the pain on range of motion of the lower extremities and history of multiple falls I will obtain x-rays of the pelvis and both knees.  We will obtain a CT head, lab work-up, and reassess.  ----------------------------------------- 6:22 PM on 07/14/2019 -----------------------------------------  CT head and x-rays show no acute findings.  Lab work-up is significant for increased creatinine when compared to last month, elevated CK, and minimally elevated  troponin.  The patient's tachycardia is improving with fluids.  At this time, I do not suspect infection/sepsis, and the lactic acid is normal.  Given the AKI, dehydration, and continued altered mental status we will admit.  ----------------------------------------- 6:38 PM on 07/14/2019 -----------------------------------------  Signed the patient out to the hospitalist Dr. Ara Kussmaul for admission.  _______________________  [name] was evaluated in Emergency Department on 07/14/2019 for the symptoms described in the history of present illness. She was evaluated in the context of the global COVID-19 pandemic, which necessitated consideration that the patient might be at risk for infection with the SARS-CoV-2 virus that causes COVID-19. Institutional protocols and algorithms that pertain to the evaluation of patients at risk for COVID-19 are in a state of rapid change based on information released by regulatory bodies including the CDC and federal and state organizations. These policies and algorithms were followed during the patient's care in the ED.  ____________________________________________   FINAL CLINICAL IMPRESSION(S) / ED DIAGNOSES  Final diagnoses:  Acute renal insufficiency  Non-traumatic rhabdomyolysis  Altered mental status, unspecified altered mental status type      NEW MEDICATIONS STARTED DURING THIS VISIT:  New Prescriptions   No medications on file     Note:  This document was prepared using Dragon voice recognition software and may include unintentional dictation errors.    Arta Silence, MD 07/14/19 [facility]ie Helper

## 2019-07-14 NOTE — Telephone Encounter (Signed)
Spoken to patient on 07/13/2019. She stated that her pain management doctor wanted patient to ask pcp regarding kidney and need monitor. I have inform patient of Kate's comments and patient is aware and wrote it down as well to let pain management know.

## 2019-07-14 NOTE — ED Notes (Signed)
EDP at bedside  

## 2019-07-14 NOTE — Care Management (Signed)
11/3: Patient is currently active with [facility] for RN and PT. SH RN CM.

## 2019-07-14 NOTE — ED Notes (Signed)
IV team at bedside 

## 2019-07-14 NOTE — Progress Notes (Signed)
VAST consulted to place IV access. Previous IV in Left arm infiltrated (short catheter placed with USG). 20g, 1.88 inch angiocath placed in left forearm in appropriate vessel with over half of catheter occupying the vein.

## 2019-07-14 NOTE — ED Triage Notes (Signed)
Pt to ED via [facility] from home.  EMS reports family concerned for patient d/t increased weakness for several months, multiple falls in the last 3 days, decreased appetite for several weeks.  Patient is alert and oriented to self, disoriented to place, time, and situation.  States chronic pain to neck and back, denies new pain, states SOB but not worse than normal.  States takes oxycodone for pain and took it today but can't remember when or how much.  Patient falling asleep in triage.

## 2019-07-15 ENCOUNTER — Inpatient Hospital Stay: Payer: Medicare Other

## 2019-07-15 DIAGNOSIS — R404 Transient alteration of awareness: Secondary | ICD-10-CM

## 2019-07-15 DIAGNOSIS — N179 Acute kidney failure, unspecified: Secondary | ICD-10-CM

## 2019-07-15 DIAGNOSIS — I10 Essential (primary) hypertension: Secondary | ICD-10-CM

## 2019-07-15 DIAGNOSIS — E1100 Type 2 diabetes mellitus with hyperosmolarity without nonketotic hyperglycemic-hyperosmolar coma (NKHHC): Secondary | ICD-10-CM

## 2019-07-15 DIAGNOSIS — N390 Urinary tract infection, site not specified: Secondary | ICD-10-CM

## 2019-07-15 DIAGNOSIS — E1165 Type 2 diabetes mellitus with hyperglycemia: Secondary | ICD-10-CM

## 2019-07-15 DIAGNOSIS — I480 Paroxysmal atrial fibrillation: Secondary | ICD-10-CM

## 2019-07-15 LAB — URINALYSIS, COMPLETE (UACMP) WITH MICROSCOPIC
Bilirubin Urine: NEGATIVE
Glucose, UA: 50 mg/dL — AB
Ketones, ur: NEGATIVE mg/dL
Nitrite: NEGATIVE
Protein, ur: 100 mg/dL — AB
RBC / HPF: >50 RBC/hpf — ABNORMAL HIGH (ref 0–5)
Specific Gravity, Urine: 1.018 (ref 1.005–1.030)
Squamous Epithelial / HPF: NONE SEEN (ref 0–5)
WBC, UA: >50 WBC/hpf — ABNORMAL HIGH (ref 0–5)
pH: 5 (ref 5.0–8.0)

## 2019-07-15 LAB — BASIC METABOLIC PANEL
Anion gap: 9 (ref 5–15)
BUN: 31 mg/dL — ABNORMAL HIGH (ref 8–23)
CO2: 22 mmol/L (ref 22–32)
Calcium: 8.4 mg/dL — ABNORMAL LOW (ref 8.9–10.3)
Chloride: 108 mmol/L (ref 98–111)
Creatinine, Ser: 2.19 mg/dL — ABNORMAL HIGH (ref 0.44–1.00)
GFR calc Af Amer: 27 mL/min — ABNORMAL LOW (ref >60)
GFR calc non Af Amer: 23 mL/min — ABNORMAL LOW (ref >60)
Glucose, Bld: 95 mg/dL (ref 70–99)
Potassium: 3.9 mmol/L (ref 3.5–5.1)
Sodium: 139 mmol/L (ref 135–145)

## 2019-07-15 LAB — GLUCOSE, CAPILLARY
Glucose-Capillary: 163 mg/dL — ABNORMAL HIGH (ref 70–99)
Glucose-Capillary: 181 mg/dL — ABNORMAL HIGH (ref 70–99)
Glucose-Capillary: 276 mg/dL — ABNORMAL HIGH (ref 70–99)
Glucose-Capillary: 281 mg/dL — ABNORMAL HIGH (ref 70–99)
Glucose-Capillary: 84 mg/dL (ref 70–99)
Glucose-Capillary: 84 mg/dL (ref 70–99)

## 2019-07-15 LAB — HIV ANTIBODY (ROUTINE TESTING W REFLEX): HIV Screen 4th Generation wRfx: NONREACTIVE

## 2019-07-15 LAB — CBC
HCT: 30.9 % — ABNORMAL LOW (ref 36.0–46.0)
Hemoglobin: 9.5 g/dL — ABNORMAL LOW (ref 12.0–15.0)
MCH: 26 pg (ref 26.0–34.0)
MCHC: 30.7 g/dL (ref 30.0–36.0)
MCV: 84.4 fL (ref 80.0–100.0)
Platelets: 158 10*3/uL (ref 150–400)
RBC: 3.66 MIL/uL — ABNORMAL LOW (ref 3.87–5.11)
RDW: 14.6 % (ref 11.5–15.5)
WBC: 6.1 10*3/uL (ref 4.0–10.5)
nRBC: 0 % (ref 0.0–0.2)

## 2019-07-15 LAB — SARS CORONAVIRUS 2 (TAT 6-24 HRS): SARS Coronavirus 2: NEGATIVE

## 2019-07-15 MED ORDER — METOPROLOL TARTRATE 25 MG PO TABS
12.5000 mg | ORAL_TABLET | Freq: Two times a day (BID) | ORAL | Status: DC
  Administered 2019-07-15 – 2019-07-18 (×7): 12.5 mg via ORAL
  Filled 2019-07-15 (×7): qty 1

## 2019-07-15 MED ORDER — INSULIN DETEMIR 100 UNIT/ML FLEXPEN: 30.0000 [IU] | PEN_INJECTOR | Freq: Every morning | SUBCUTANEOUS | Status: DC

## 2019-07-15 MED ORDER — POTASSIUM CHLORIDE CRYS ER 20 MEQ PO TBCR
40.0000 meq | EXTENDED_RELEASE_TABLET | Freq: Once | ORAL | Status: AC
  Administered 2019-07-15: 05:00:00 40 meq via ORAL
  Filled 2019-07-15: qty 2

## 2019-07-15 MED ORDER — INSULIN DETEMIR 100 UNIT/ML ~~LOC~~ SOLN
25.0000 [IU] | Freq: Every day | SUBCUTANEOUS | Status: DC
  Administered 2019-07-16 – 2019-07-17 (×2): 25 [IU] via SUBCUTANEOUS
  Filled 2019-07-15 (×3): qty 0.25

## 2019-07-15 MED ORDER — SODIUM CHLORIDE 0.9 % IV SOLN
1.0000 g | INTRAVENOUS | Status: DC
  Administered 2019-07-15 – 2019-07-17 (×3): 1 g via INTRAVENOUS
  Filled 2019-07-15 (×2): qty 1
  Filled 2019-07-15: qty 10
  Filled 2019-07-15: qty 1

## 2019-07-15 MED ORDER — INSULIN ASPART 100 UNIT/ML ~~LOC~~ SOLN
0.0000 [IU] | SUBCUTANEOUS | Status: DC
  Administered 2019-07-15 (×3): 3 [IU] via SUBCUTANEOUS
  Administered 2019-07-16: 08:00:00 2 [IU] via SUBCUTANEOUS
  Administered 2019-07-16: 16:00:00 5 [IU] via SUBCUTANEOUS
  Administered 2019-07-16: 15 [IU] via SUBCUTANEOUS
  Administered 2019-07-16: 8 [IU] via SUBCUTANEOUS
  Administered 2019-07-16 – 2019-07-17 (×2): 5 [IU] via SUBCUTANEOUS
  Administered 2019-07-17: 8 [IU] via SUBCUTANEOUS
  Administered 2019-07-17: 12:00:00 5 [IU] via SUBCUTANEOUS
  Administered 2019-07-17: 17:00:00 8 [IU] via SUBCUTANEOUS
  Administered 2019-07-17: 3 [IU] via SUBCUTANEOUS
  Administered 2019-07-17 – 2019-07-18 (×2): 2 [IU] via SUBCUTANEOUS
  Administered 2019-07-18: 01:00:00 3 [IU] via SUBCUTANEOUS
  Filled 2019-07-15 (×16): qty 1

## 2019-07-15 MED ORDER — SERTRALINE HCL 50 MG PO TABS
25.0000 mg | ORAL_TABLET | Freq: Every day | ORAL | Status: DC
  Administered 2019-07-15 – 2019-07-18 (×4): 25 mg via ORAL
  Filled 2019-07-15 (×4): qty 1

## 2019-07-15 NOTE — Evaluation (Signed)
Physical Therapy Evaluation Patient Details Name: [name] MRN: [id number] DOB: [birth year] Today's Date: 07/15/2019   History of Present Illness  Pt admitted for AMS with complaints of weakness and multiple falls, most recent fall occured yesterday and pt was unable get up off the floor. Other PMH includes HTN, GERD, and DM.   Clinical Impression  Pt is a pleasant 63 year old female who was admitted for AMS. Pt performs bed mobility, transfers, and ambulation with min assist and RW. Pt is  Pt demonstrates deficits with strength/cognition/mobility/pain. Pt reports pain in B LEs from multiple falls, most recent yesterday. Reports she is receiving HHPT, however is unable to ambulate long distances. Doesn't appear to be at baseline level. Would benefit from skilled PT to address above deficits and promote optimal return to PLOF; recommend transition to STR upon discharge from acute hospitalization.     Follow Up Recommendations SNF(however pt prefers to go home)    Equipment Recommendations  None recommended by PT    Recommendations for Other Services       Precautions / Restrictions Precautions Precautions: Fall Restrictions Weight Bearing Restrictions: No      Mobility  Bed Mobility Overal bed mobility: Needs Assistance Bed Mobility: Supine to Sit     Supine to sit: Min assist     General bed mobility comments: needs assist and cues for sequencing. Once seated at EOB, able to sit with upright posture  Transfers Overall transfer level: Needs assistance Equipment used: Rolling walker (2 wheeled) Transfers: Sit to/from Stand Sit to Stand: Min assist         General transfer comment: Unsteady with post lean upon initial transfer, able to self correct with verbal cues.  Ambulation/Gait Ambulation/Gait assistance: Min assist Gait Distance (Feet): 10 Feet Assistive device: Rolling walker (2 wheeled) Gait Pattern/deviations: Step-to pattern     General Gait  Details: ambulated from bed->BSC->recliner. Slow step to gait pattern with unsteadiness noted. Needs hands on assist for balance. Fatigues quickly and reports she is unable to further ambulate in room despite encouragement  Stairs            Wheelchair Mobility    Modified Rankin (Stroke Patients Only)       Balance Overall balance assessment: Needs assistance;History of Falls Sitting-balance support: Feet supported;No upper extremity supported Sitting balance-Leahy Scale: Good     Standing balance support: Bilateral upper extremity supported Standing balance-Leahy Scale: Fair                               Pertinent Vitals/Pain Pain Assessment: Faces Faces Pain Scale: Hurts a little bit Pain Location: B knees Pain Descriptors / Indicators: Aching Pain Intervention(s): Limited activity within patient's tolerance    Home Living Family/patient expects to be discharged to:: Private residence Living Arrangements: Spouse/significant other Available Help at Discharge: Family;Available 24 hours/day Type of Home: Mobile home Home Access: Ramped entrance     Home Layout: One level Home Equipment: [place] - 2 wheels;Cane - quad Additional Comments: Pt reports husband available for 24/7 support. Son can also help    Prior Function Level of Independence: Independent with assistive device(s)         Comments: reports she was able to ambulate using RW at all time, however has noticed she has been getting weaker and noticable decline. Multiple falls. She reports she walks limited household distances, unable to ambulate to her mailbox     Hand  Dominance        Extremity/Trunk Assessment   Upper Extremity Assessment Upper Extremity Assessment: Generalized weakness(B UE grossly 3/5)    Lower Extremity Assessment Lower Extremity Assessment: (B LE grossly 3+/5)       Communication   Communication: No difficulties  Cognition Arousal/Alertness:  Awake/alert Behavior During Therapy: WFL for tasks assessed/performed Overall Cognitive Status: Impaired/Different from baseline                                 General Comments: didn't know her name or birthday. Able to recall home environment which matches from previous notes. Appeared confused to situation      General Comments      Exercises Other Exercises Other Exercises: supine ther-ex with limited ROM noted. B LE including AP, SLRs, heel slides, and hip abd/add. All ther-ex performed x 10 reps. Other Exercises: ambulated to [facility]. Needs cga for set up and cues for hygiene.   Assessment/Plan    PT Assessment Patient needs continued PT services  PT Problem List Decreased strength;Decreased activity tolerance;Decreased balance;Decreased mobility       PT Treatment Interventions Gait training;DME instruction;Therapeutic exercise;Balance training    PT Goals (Current goals can be found in the Care Plan section)  Acute Rehab PT Goals Patient Stated Goal: to go home today PT Goal Formulation: With patient Time For Goal Achievement: 07/29/19 Potential to Achieve Goals: Good    Frequency Min 2X/week   Barriers to discharge        Co-evaluation               AM-PAC PT "6 Clicks" Mobility  Outcome Measure Help needed turning from your back to your side while in a flat bed without using bedrails?: A Little Help needed moving from lying on your back to sitting on the side of a flat bed without using bedrails?: A Little Help needed moving to and from a bed to a chair (including a wheelchair)?: A Little Help needed standing up from a chair using your arms (e.g., wheelchair or bedside chair)?: A Little Help needed to walk in hospital room?: A Lot Help needed climbing 3-5 steps with a railing? : A Lot 6 Click Score: 16    End of Session Equipment Utilized During Treatment: Gait belt Activity Tolerance: Patient tolerated treatment well Patient left: in  chair;with chair alarm set Nurse Communication: Mobility status PT Visit Diagnosis: Unsteadiness on feet (R26.81);Repeated falls (R29.6);Muscle weakness (generalized) (M62.81);History of falling (Z91.81);Difficulty in walking, not elsewhere classified (R26.2)    Time: 5830-9407 PT Time Calculation (min) (ACUTE ONLY): 25 min   Charges:   PT Evaluation $PT Eval Low Complexity: 1 Low PT Treatments $Therapeutic Exercise: 8-22 mins        Greggory Stallion, PT, DPT [phone]   Nadra Hritz 07/15/2019, 11:49 AM

## 2019-07-15 NOTE — Evaluation (Signed)
Occupational Therapy Evaluation Patient Details Name: [name] MRN: [id number] DOB: February 15, 1956 Today's Date: 07/15/2019    History of Present Illness Pt admitted for AMS with complaints of weakness and multiple falls, most recent fall occured yesterday and pt was unable get up off the floor. Other PMH includes HTN, GERD, and DM.    Clinical Impression   [name] was seen for OT evaluation this date. Prior to hospital admission, pt was modified independent for functional mobility using a RW, generally independent with bathing and dressing, and getting assistance from a family friend for IADL mgt including driving, shopping, and cooking. Pt endorses a significant falls history in the past six months reporting at least 4 in the past 3 days. Pt states she is unsure what is going on as it seems like she is just "up one minute and on the ground the next". Pt reports multiple falls in her bathroom and states she has had a very difficult time standing up from the commode recently.  Pt lives with her husband in a 1 level mobile home with a ramped entrance.  Currently pt demonstrates impairments in cognition, safety awareness, strength, endurance and activity tolerance requiring minimal assistance assist for functional transfers and mobility and moderate assistance for heavy ADL tasks such as bathing and dressing.  Pt would benefit from skilled OT to address noted impairments and functional limitations (see below for any additional details) in order to maximize safety and independence while minimizing falls risk and caregiver burden.  Upon hospital discharge, recommend STR to maximize pt safety and return to PLOF.     Follow Up Recommendations  SNF    Equipment Recommendations  3 in 1 bedside commode    Recommendations for Other Services       Precautions / Restrictions Precautions Precautions: Fall Restrictions Weight Bearing Restrictions: No      Mobility Bed Mobility Overal bed  mobility: Needs Assistance Bed Mobility: Supine to Sit     Supine to sit: Supervision;Min guard     General bed mobility comments: needs assist and cues for sequencing.  Transfers Overall transfer level: Needs assistance Equipment used: Rolling walker (2 wheeled) Transfers: Sit to/from Stand Sit to Stand: Min assist              Balance Overall balance assessment: Needs assistance;History of Falls Sitting-balance support: Feet supported;No upper extremity supported Sitting balance-Leahy Scale: Good Sitting balance - Comments: Steady static sitting, reaching within BOS   Standing balance support: Bilateral upper extremity supported Standing balance-Leahy Scale: Fair Standing balance comment: Heavy UE use on RW. Mild LOB noted upon initial attempt to sit onto [facility]. Heavy VCs for hand placement and using when going from STS                           ADL either performed or assessed with clinical judgement   ADL Overall ADL's : Needs assistance/impaired                                       General ADL Comments: Pt currently min assist for functional transfers with improvement upon successive trials noted this date. She requires initial assistance for lift off from low bed/BSC during OT session. Min assist for LB ADL Mgt 2/2 increased pain and fatigue with attempts to reach feet.     Vision Baseline Vision/History: Wears glasses  Wears Glasses: At all times Patient Visual Report: No change from baseline       Perception     Praxis      Pertinent Vitals/Pain Faces Pain Scale: Hurts whole lot Pain Location: Low back and B Knees. Pain Descriptors / Indicators: Sore;Aching;Discomfort;Grimacing Pain Intervention(s): Limited activity within patient's tolerance;Monitored during session;Repositioned;Utilized relaxation techniques     Hand Dominance Right   Extremity/Trunk Assessment Upper Extremity Assessment Upper Extremity Assessment:  Generalized weakness(BUE grossly 3/5 with decreased [place]. ROM WFL.)   Lower Extremity Assessment Lower Extremity Assessment: Generalized weakness;Defer to PT evaluation       Communication Communication Communication: No difficulties   Cognition Arousal/Alertness: Awake/alert Behavior During Therapy: WFL for tasks assessed/performed Overall Cognitive Status: Impaired/Different from baseline Area of Impairment: Safety/judgement                         Safety/Judgement: Decreased awareness of safety;Decreased awareness of deficits     General Comments: Pt alert and oriented, but continues to appear mildly confused. Impulsive with movements and decreased safety awareness noted with functional mobility/transfers.   General Comments       Exercises Other Exercises Other Exercises: Pt educated in falls prevention strategies, safe use of AE/DME for ADL and functional mobility, safe transfer techniques, and routines modifications to support sfety and functional independence upon hospital DC.   Shoulder Instructions      Home Living Family/patient expects to be discharged to:: Private residence Living Arrangements: Spouse/significant other Available Help at Discharge: Family;Available 24 hours/day Type of Home: Mobile home Home Access: Ramped entrance     Home Layout: One level     Bathroom Shower/Tub: Tub/shower unit;Curtain   Biochemist, clinical: Standard     Home Equipment: Environmental consultant - 2 wheels;Cane - quad   Additional Comments: Pt reports husband available for 24/7 support. Son can also help      Prior Functioning/Environment Level of Independence: Independent with assistive device(s)        Comments: reports she was able to ambulate using RW at all time, however has noticed she has been getting weaker and noticable decline. Multiple falls. She reports she walks limited household distances, unable to ambulate to her mailbox        OT Problem List: Decreased  strength;Decreased coordination;Pain;Decreased range of motion;Decreased activity tolerance;Decreased safety awareness;Decreased cognition;Impaired balance (sitting and/or standing);Decreased knowledge of use of DME or AE      OT Treatment/Interventions: Self-care/ADL training;Balance training;Therapeutic exercise;Therapeutic activities;DME and/or AE instruction;Patient/family education;Cognitive remediation/compensation    OT Goals(Current goals can be found in the care plan section) Acute Rehab OT Goals Patient Stated Goal: to go home OT Goal Formulation: With patient Time For Goal Achievement: 07/29/19 Potential to Achieve Goals: Good ADL Goals Pt Will Perform Grooming: sitting;with modified independence Pt Will Transfer to Toilet: ambulating;bedside commode;with set-up;with supervision(With LRAD PRN for improved safety and functional independence.) Pt Will Perform Toileting - Clothing Manipulation and hygiene: sit to/from stand;with set-up;with supervision(With LRAD PRN for improved safety and functional independence.)  OT Frequency: Min 1X/week   Barriers to D/C: Inaccessible home environment          Co-evaluation              AM-PAC OT "6 Clicks" Daily Activity     Outcome Measure Help from another person eating meals?: A Little Help from another person taking care of personal grooming?: A Little Help from another person toileting, which includes using toliet, bedpan, or urinal?: A  Little Help from another person bathing (including washing, rinsing, drying)?: A Lot Help from another person to put on and taking off regular upper body clothing?: A Little Help from another person to put on and taking off regular lower body clothing?: A Little 6 Click Score: 17   End of Session Equipment Utilized During Treatment: Gait belt;Rolling walker  Activity Tolerance: Patient tolerated treatment well Patient left: in bed;with call bell/phone within reach;with bed alarm set  OT  Visit Diagnosis: Other abnormalities of gait and mobility (R26.89);Repeated falls (R29.6);History of falling (Z91.81);Pain Pain - Right/Left: (Both) Pain - part of body: Hip;Knee;Leg                Time: 1411-1430 OT Time Calculation (min): 19 min Charges:  OT General Charges $OT Visit: 1 Visit OT Evaluation $OT Eval Moderate Complexity: 1 Mod OT Treatments $Self Care/Home Management : 8-22 mins  Shara Blazing, M.S., OTR/L Ascom: [phone] 07/15/19, 4:32 PM

## 2019-07-15 NOTE — Progress Notes (Addendum)
PROGRESS NOTE  MOYA DUAN KZL:[id number] DOB: 1956/03/17 DOA: 07/14/2019 PCP: [name], NP  Brief History   Melisia Leming is a 63 y.o. female with a known history of HTN, GERD, DM, PAF on Eliquis, presents to the emergency department for evaluation of AMS.  Patient was in a usual state of health until the past few weeks she has become increasingly weak. With decreased appetite, multiple falls and confusion. Patient responds "I don't know" to all questions. .   EMS/ED Course: Patient received NS. Medical admission has been requested for further management of AMS, acute encephalopathy.  The patient was admitted to a telemetry bed. Her potassium was supplemented and made replete. She has been ruled out for MI by EKG and enzyme criteria. She received a 1 liter bolus of IV NS resulting in a small decline in creatinine. UA and urine culture have been ordered .  Consultants  None  Procedures  None  Antibiotics   Anti-infectives (From admission, onward)    None      Subjective  The patient is resting comfortably. She states that she has not been eating well at home, because it is too hard for her to stand to make herself a meal. She states that her husband feeds himself. He asks her if he can make her something, but she states that she isn't very interested, especially in the last week. She states she has been very weak for the last couple of weeks.  Objective   Vitals:  Vitals:   07/15/19 1102 07/15/19 1120  BP: 104/65   Pulse: (!) 115 (!) 110  Resp:  16  Temp: 98.5 F (36.9 C)   SpO2:  97%   Exam:  Constitutional:  The patient is awake, and somewhat confused. No acute distress. Respiratory:  No increased work of breathing. No wheezes, rales, or rhonchi No tactile fremitus Cardiovascular:  Regular rate and rhythm No murmurs, ectopy, or gallups. No lateral PMI. No thrills. Abdomen:  Abdomen is soft, non-tender, non-distended No hernias, masses, or  organomegaly Normoactive bowel sounds.  Musculoskeletal:  No cyanosis, clubbing, or edema Skin:  No rashes, lesions, ulcers palpation of skin: no induration or nodules Neurologic:  CN 2-12 intact Sensation all 4 extremities intact Psychiatric:  Mental status Mood, affect appropriate Orientation to person, place, time  judgment and insight appear intact  I have personally reviewed the following:   Today's Data  Vitals, BMP, Troponins, CK, Lactic Acid, CBC  Lab Data  UA positive for large LES, no nitrites, but bacteria  Micro Data  COVID-19 - negative, HIV - NR Blood culture x 2  - no growth  Urine culture is pending.  Scheduled Meds:  enoxaparin (LOVENOX) injection  30 mg Subcutaneous Q24H   insulin aspart  0-15 Units Subcutaneous Q4H   Continuous Infusions:  sodium chloride 75 mL/hr at 07/15/19 1149   sodium chloride Stopped (07/14/19 1827)    Active Problems:   Altered mental status   LOS: 1 day   A & P   AMS: DDx - Toxic/metabolic vs. Uremia. The patient was on oxycodone at home. This has been held here. She has no complaints of pain. UA appears possibly positive, urine culture is pending. Patient admits to frequent urges to urinate. Blood cultures x 2 have had no growth. Exact cause of encephalopathy is clinically undetermined.   Acute kidney injury: Continue Cautious IV fluids. (EF on echocardiogram from 12/2017 demonstrated an EF  Of 55 - 60% with no evidence of  diastolic dysfunction). Monitor creatinine, electrolytes, and volume status. Avoid nephrotoxic medications and hypotension. Check renal ultrasound. Bladder scan and place foley catheter if evidence of urinary retention.   Mild hypokalemia: Resolved with supplementation, but likely to recur with resolution of AKI. Monitor.   Elevated troponin: Likely secondary to AKI. Troponins have remained flat. The patient does not complain of chest pain. EKG with without ischemic changes.     Essential HTN: Blood  pressures are low - normotensive without antihypertensives. Monitor.   Paroxysmal Atrial Fibrillation: Rate is a little high in low 100's, but patient is not on metoprolol. Will restart at a lower dose for rate control. Continue eliquis.   DM II: Hemeglobin A1c 9.7 in 05/2019. The patient's glucoses will be managed by continuing the patient's lantus as at home with FSBS and SSI. She will be on a heart healthy, carb controlled diet and a hypoglycemic protocol.  I have seen and examined this patient myself. I have spent 35 minutes in her evaluation and care.   Admission status: Inpatient, tele IV Fluids: NS Diet/Nutrition: Heart healthy carb controlled Consults called: None  Emmanuela Ghazi, DO [facility] Direct contact: see [email or web address]  7PM-7AM contact night coverage as above 07/15/2019, 12:46 PM  LOS: 1 day

## 2019-07-16 ENCOUNTER — Inpatient Hospital Stay: Payer: Medicare Other

## 2019-07-16 DIAGNOSIS — R627 Adult failure to thrive: Secondary | ICD-10-CM

## 2019-07-16 DIAGNOSIS — G9341 Metabolic encephalopathy: Secondary | ICD-10-CM

## 2019-07-16 DIAGNOSIS — Z794 Long term (current) use of insulin: Secondary | ICD-10-CM

## 2019-07-16 DIAGNOSIS — E118 Type 2 diabetes mellitus with unspecified complications: Secondary | ICD-10-CM

## 2019-07-16 DIAGNOSIS — N134 Hydroureter: Secondary | ICD-10-CM

## 2019-07-16 DIAGNOSIS — E876 Hypokalemia: Secondary | ICD-10-CM

## 2019-07-16 LAB — BASIC METABOLIC PANEL
Anion gap: 8 (ref 5–15)
BUN: 26 mg/dL — ABNORMAL HIGH (ref 8–23)
CO2: 20 mmol/L — ABNORMAL LOW (ref 22–32)
Calcium: 8.4 mg/dL — ABNORMAL LOW (ref 8.9–10.3)
Chloride: 108 mmol/L (ref 98–111)
Creatinine, Ser: 1.62 mg/dL — ABNORMAL HIGH (ref 0.44–1.00)
GFR calc Af Amer: 38 mL/min — ABNORMAL LOW (ref >60)
GFR calc non Af Amer: 33 mL/min — ABNORMAL LOW (ref >60)
Glucose, Bld: 123 mg/dL — ABNORMAL HIGH (ref 70–99)
Potassium: 3.3 mmol/L — ABNORMAL LOW (ref 3.5–5.1)
Sodium: 136 mmol/L (ref 135–145)

## 2019-07-16 LAB — URINE CULTURE

## 2019-07-16 LAB — GLUCOSE, CAPILLARY
Glucose-Capillary: 62 mg/dL — ABNORMAL LOW (ref 70–99)
Glucose-Capillary: 89 mg/dL (ref 70–99)
Glucose-Capillary: 140 mg/dL — ABNORMAL HIGH (ref 70–99)
Glucose-Capillary: 351 mg/dL — ABNORMAL HIGH (ref 70–99)
Glucose-Capillary: 222 mg/dL — ABNORMAL HIGH (ref 70–99)
Glucose-Capillary: 213 mg/dL — ABNORMAL HIGH (ref 70–99)

## 2019-07-16 MED ORDER — INSULIN NPH (HUMAN) (ISOPHANE) 100 UNIT/ML ~~LOC~~ SUSP: 8.0000 [IU] | Freq: Once | SUBCUTANEOUS | Status: DC

## 2019-07-16 MED ORDER — FLUCONAZOLE 100MG IVPB
100.0000 mg | INTRAVENOUS | Status: DC
  Administered 2019-07-16 – 2019-07-18 (×3): 100 mg via INTRAVENOUS
  Filled 2019-07-16 (×4): qty 50

## 2019-07-16 MED ORDER — POTASSIUM CHLORIDE CRYS ER 20 MEQ PO TBCR
40.0000 meq | EXTENDED_RELEASE_TABLET | Freq: Once | ORAL | Status: AC
  Administered 2019-07-16: 16:00:00 40 meq via ORAL
  Filled 2019-07-16: qty 2

## 2019-07-16 MED ORDER — INSULIN DETEMIR 100 UNIT/ML ~~LOC~~ SOLN
8.0000 [IU] | Freq: Once | SUBCUTANEOUS | Status: AC
  Administered 2019-07-16: 8 [IU] via SUBCUTANEOUS
  Filled 2019-07-16: qty 0.08

## 2019-07-16 NOTE — Progress Notes (Signed)
PROGRESS NOTE  [name] EZM:[id number] DOB: 09-Sep-1956 DOA: 07/14/2019 PCP: [name], NP  Brief History   [name] is a 63 y.o. female with a known history of HTN, GERD, DM, PAF on Eliquis, presents to the emergency department for evaluation of AMS.  Patient was in a usual state of health until the past few weeks she has become increasingly weak. With decreased appetite, multiple falls and confusion. Patient responds "I don't know" to all questions. .   Patient received NS in the ED. Medical admission was requested for further management of AMS, acute encephalopathy.  The patient was admitted to a telemetry bed. Her potassium was supplemented and made replete. She has been ruled out for MI by EKG and enzyme criteria. She received a 1 liter bolus of IV NS resulting in a small decline in creatinine. UA and urine culture have been ordered . She is being treated with IV Rocephin. Urine culture is pending. Blood cultures have been obtained and have had no growth.  Renal ultrasound was obtained to evaluate the patient for possible obstructive uropathy. It demonstrated right sided hydroureter and stent that was placed at the beginning of 2020 that was to have been removed in February. However, the patient cancelled this appointment, and did not rescheduled. I have contacted the patient's urologist, Dr. Diamantina Providence. He recommends that the patient remain inpatient to receive her IV antibiotics and antifungal. He will see the patient as outpatient for removal of stend and to address hydroureter.  Consultants  . Urology  Procedures  . None  Antibiotics   Anti-infectives (From admission, onward)   Start     Dose/Rate Route Frequency Ordered Stop   07/16/19 1100  fluconazole (DIFLUCAN) IVPB 100 mg     100 mg 50 mL/hr over 60 Minutes Intravenous Every 24 hours 07/16/19 1000     07/15/19 1800  cefTRIAXone (ROCEPHIN) 1 g in sodium chloride 0.9 % 100 mL IVPB     1 g 200 mL/hr over 30  Minutes Intravenous Every 24 hours 07/15/19 1508       Subjective  The patient is resting comfortably. She states that she has not been eating well at home, because it is too hard for her to stand to make herself a meal. She states that her husband feeds himself. He asks her if he can make her something, but she states that she isn't very interested, especially in the last week. She states she has been very weak for the last couple of weeks.  Objective   Vitals:  Vitals:   07/15/19 2000 07/16/19 0413  BP: (!) 116/58 (!) 111/57  Pulse: 81 71  Resp: 16 18  Temp: 98.1 F (36.7 C) 97.7 F (36.5 C)  SpO2: 100% 99%   Exam:  Constitutional:  . The patient is awake, and somewhat confused. No acute distress. Respiratory:  . No increased work of breathing. . No wheezes, rales, or rhonchi . No tactile fremitus Cardiovascular:  . Regular rate and rhythm . No murmurs, ectopy, or gallups. . No lateral PMI. No thrills. Abdomen:  . Abdomen is soft, non-tender, non-distended . No hernias, masses, or organomegaly . Normoactive bowel sounds.  Musculoskeletal:  . No cyanosis, clubbing, or edema Skin:  . No rashes, lesions, ulcers . palpation of skin: no induration or nodules Neurologic:  . CN 2-12 intact . Sensation all 4 extremities intact Psychiatric:  . Mental status  Mood, affect appropriate  Orientation to person, place, time  . judgment and  insight appear intact  I have personally reviewed the following:   Today's Data  . Vitals, BMP, Glucose  Lab Data  . UA positive for large LES, no nitrites, but positive for bacteria  Renal Ultrasound: Right sided hydroureter, stent in right ureter.  Micro Data  . COVID-19 - negative, HIV - NR . Blood culture x 2  - no growth  . Urine culture is pending.  Scheduled Meds: . enoxaparin (LOVENOX) injection  30 mg Subcutaneous Q24H  . insulin aspart  0-15 Units Subcutaneous Q4H  . insulin detemir  25 Units Subcutaneous QHS  .  metoprolol tartrate  12.5 mg Oral BID  . sertraline  25 mg Oral Daily   Continuous Infusions: . sodium chloride 75 mL/hr at 07/16/19 0822  . cefTRIAXone (ROCEPHIN)  IV Stopped (07/15/19 1819)  . fluconazole (DIFLUCAN) IV 100 mg (07/16/19 1148)  . sodium chloride Stopped (07/14/19 1827)    Active Problems:   Altered mental status   LOS: 2 days   A & P   AMS: DDx - Toxic/metabolic vs. Uremia. The patient was on oxycodone at home. This has been held here. She has no complaints of pain. UA appears possibly positive, urine culture is pending. Patient admits to frequent urges to urinate. Blood cultures x 2 have had no growth. Exact cause of encephalopathy is clinically undetermined.   Acute kidney injury: Continue Cautious IV fluids. (EF on echocardiogram from 12/2017 demonstrated an EF  Of 55 - 60% with no evidence of diastolic dysfunction). Monitor creatinine, electrolytes, and volume status. Avoid nephrotoxic medications and hypotension. Renal ultrasound performed. It demonstrates righthydroureter and stent in right ureter from the beginning of 2020. I have contacted Dr. Diamantina Providence and consulted him. He stated that he would send his PA, but that pt should complete IV antibiotics and antifungals. They she should follow up with him as outpatient. Bladder scan and place foley catheter if evidence of urinary retention.   Mild hypokalemia: Supplement and moniotr. Likely to recur with resolution of AKI. Monitor.   Elevated troponin: Likely secondary to AKI. Troponins have remained flat. The patient does not complain of chest pain. EKG with without ischemic changes.     Essential HTN: Blood pressures are low - normotensive without antihypertensives. Monitor.   Paroxysmal Atrial Fibrillation: Tachycardia is resolved with low dose metoprolol. Continue eliquis.   DM II: Hemeglobin A1c 9.7 in 05/2019. The patient's glucoses will be managed by continuing the patient's lantus as at home with FSBS and SSI.  She will be on a heart healthy, carb controlled diet and a hypoglycemic protocol. FSBS has run 62 - 351 today. The patient was hypoglycemic this morning at 62 with 351 early this afternoon. Will supplement with 8 units of NPH pending initiation of lantus this evening.  I have seen and examined this patient myself. I have spent 38 minutes in her evaluation and care.   Admission status: Inpatient, tele IV Fluids: NS Diet/Nutrition: Heart healthy carb controlled Consults called: None  Mariaclara Spear, DO [facility] Direct contact: see [email or web address]  7PM-7AM contact night coverage as above 07/16/2019, 1:50 PM  LOS: 1 day

## 2019-07-16 NOTE — Progress Notes (Signed)
Inpatient Diabetes Program Recommendations  AACE/ADA: New Consensus Statement on Inpatient Glycemic Control   Target Ranges:  Prepandial:   less than 140 mg/dL      Peak postprandial:   less than 180 mg/dL (1-2 hours)      Critically ill patients:  140 - 180 mg/dL   Results for ARNETIA, BRONK (MRN [id number]) as of 07/16/2019 09:09  Ref. Range 07/15/2019 07:39 07/15/2019 11:25 07/15/2019 16:32 07/15/2019 20:05 07/15/2019 23:53 07/16/2019 04:08 07/16/2019 04:34 07/16/2019 07:56  Glucose-Capillary Latest Ref Range: 70 - 99 mg/dL 84 181 (H)  Novolog 3 units 163 (H)  Novolog 3 units 281 (H)  Novolog 3 units  276 (H)  Novolog 8 units  62 (L) 89 140 (H)  Novolog 2 units   Review of Glycemic Control  Diabetes history: DM2 Outpatient Diabetes medications: Levemir 25 units QAM, Levemir 30 units QHS Current orders for Inpatient glycemic control: Levemir 25 units QHS, Novolog 0-15 units Q4H  Inpatient Diabetes Program Recommendations:   Insulin-Basal: No Levemir given since admitted. Please consider decreasing Levemir to 7 units Q24H (based on 69.4 kg x 0.1 units).  Insulin-Correction: Please consider decreasing Novolog to sensitive scale (0-9 units) Q4H.  Thanks, Barnie Alderman, RN, MSN, CDE Diabetes Coordinator Inpatient Diabetes Program [phone] (Team Pager from 8am to 5pm)

## 2019-07-16 NOTE — Consult Note (Signed)
07/16/19 10:08 AM   [name] 1956/02/01 [id number]  CC: Retained right ureteral stent, UTI  HPI: I was asked to see [name] in consultation for UTI and retained right ureteral stent from Dr. Benny Lennert.   She is a very co-morbid 63 year old female with atrial fibrillation and cirrhosis currently admitted to the hospitalist with altered mental status and possible UTI.  She had a right ureteral stone treated by Dr. Tresa Moore in November 2017 with stent placement, and unfortunately was lost to follow-up and her stent remains in place.  She was also seen by Dr. Gloriann Loan in December 2019 who recommended follow-up for stent removal, but she did not follow-up as scheduled, in addition to myself in January 2020. She has no showed multiple urology clinic visits for stent removal.  In January she had a likely peri-renal abscess treated with antibiotics that appears to have resolved on latest CT 05/16/2019.  She is afebrile and denies any flank pain.  She has no difficulty voiding, and no gross hematuria.    She feels much improved since admission.     PMH: Past Medical History:  Diagnosis Date  . Acute encephalopathy 05/22/2018  . Allergy   . Anxiety   . Ascites   . C. difficile colitis 07/10/2015  . Cancer (HCC)    HX OF CANCER OF UTERUS   . Cirrhosis of liver not due to alcohol ([place]) 2016  . Degenerative disk disease   . Diverticulitis   . Gastroparesis   . GERD (gastroesophageal reflux disease)   . History of hiatal hernia   . Hypertension   . Hypothyroid   . Hypothyroidism due to amiodarone   . Ileus ([place]) 08/01/2015  . Intussusception intestine ([place]) 05/2015  . Orthostatic hypotension   . PAF (paroxysmal atrial fibrillation) ([place]) 03/2015   a. new onset 03/2015 in setting of intractable N/V; b. on Eliquis 5 mg bid; c. CHADSVASc 4 (DM, TIA x 2, female)  . Pancreatitis   . Pneumonia 11/14/2015  . Right ureteral stone 07/14/2016  . Sepsis ([place]) 12/15/2017  . Sick sinus syndrome ([place])    . Stomach ulcer   . Stroke [facility])    with minimal left sided weakness  . Syncope 01/2015  . Syncope due to orthostatic hypotension 05/18/2015  . Tachyarrhythmia 01/10/2016  . TIA (transient ischemic attack) 02/2015  . Type 1 diabetes ([place])    on levemir  . UTERINE CANCER, HX OF 03/27/2007   Qualifier: Diagnosis of  By: Maxie Better FNP, Rosalita Levan   . UTI (urinary tract infection) 05/22/2018    Surgical History: Past Surgical History:  Procedure Laterality Date  . ABDOMINAL HYSTERECTOMY    . CARDIAC CATHETERIZATION N/A 01/12/2016   Procedure: Left Heart Cath and Coronary Angiography;  Surgeon: Wellington Hampshire, MD;  Location: [place] [facility];  Service: Cardiovascular;  Laterality: N/A;  . CHOLECYSTECTOMY    . CYSTOSCOPY/URETEROSCOPY/HOLMIUM LASER Right 07/14/2016   Procedure: CYSTOSCOPY/URETEROSCOPY/HOLMIUM LASER;  Surgeon: Alexis Frock, MD;  Location: [facility] ORS;  Service: Urology;  Laterality: Right;  . ESOPHAGOGASTRODUODENOSCOPY N/A 04/04/2015   Procedure: ESOPHAGOGASTRODUODENOSCOPY (EGD);  Surgeon: Hulen Luster, MD;  Location: [facility] ENDOSCOPY;  Service: Endoscopy;  Laterality: N/A;  . ESOPHAGOGASTRODUODENOSCOPY N/A 12/28/2017   Procedure: ESOPHAGOGASTRODUODENOSCOPY (EGD);  Surgeon: Lin Landsman, MD;  Location[facility] ENDOSCOPY;  Service: Gastroenterology;  Laterality: N/A;  . ESOPHAGOGASTRODUODENOSCOPY (EGD) WITH PROPOFOL N/A 01/18/2016   Procedure: ESOPHAGOGASTRODUODENOSCOPY (EGD) WITH PROPOFOL;  Surgeon: Lucilla Lame, MD;  Location: [facility] ENDOSCOPY;  Service: Endoscopy;  Laterality: N/A;  . FLEXIBLE SIGMOIDOSCOPY N/A 01/18/2016   Procedure: FLEXIBLE SIGMOIDOSCOPY;  Surgeon: Lucilla Lame, MD;  Location: [facility] ENDOSCOPY;  Service: Endoscopy;  Laterality: N/A;  . HERNIA REPAIR     Allergies:  Allergies  Allergen Reactions  . Aspirin Rash  . Codeine Sulfate Rash  . Erythromycin Rash    Reaction:  Fever   . Prednisone Swelling  . Rosiglitazone Maleate Swelling  . Tetanus-Diphtheria  Toxoids Td Rash and Other (See Comments)    Reaction:  Fever     Family History: Family History  Problem Relation Age of Onset  . Hypertension Mother   . CAD Sister   . Heart attack Sister        Deceased 2014-11-29  . CAD Brother     Social History:  reports that she has quit smoking. Her smoking use included cigarettes. She has never used smokeless tobacco. She reports that she does not drink alcohol or use drugs.  ROS: Please see flowsheet from today's date for complete review of systems.  Physical Exam: BP (!) 111/57 (BP Location: Right Arm)   Pulse 71   Temp 97.7 F (36.5 C) (Oral)   Resp 18   Ht 5' 3"  (1.6 m)   Wt 69.4 kg   SpO2 99%   BMI 27.10 kg/m    Constitutional:  Alert and oriented, No acute distress. Cardiovascular: No clubbing, cyanosis, or edema. Respiratory: Normal respiratory effort, no increased work of breathing. GI: Abdomen is soft, nontender, nondistended, no abdominal masses GU: No CVA tenderness Lymph: No cervical or inguinal lymphadenopathy. Skin: No rashes, bruises or suspicious lesions. Neurologic: Grossly intact, no focal deficits, moving all 4 extremities. Psychiatric: Normal mood and affect.  Laboratory Data: Reviewed  Pertinent Imaging: I have personally reviewed the CT from 05/16/2019 and the renal ultrasound from yesterday.  Right ureteral stent with mild hydronephrosis, no obvious ureteral or renal stones  Assessment & Plan:   In summary, the patient is a comorbid 63 year old female with a history of a right ureteral stone treated with ureteroscopy by Dr. Tresa Moore in 11-30-2015, and a retained right ureteral stent since that time.  She has been lost to follow-up numerous times.  She also had a renal abscess that resolved with antibiotics in January 2020.  There is no evidence of abscess or residual stones on her CT from September 2020.  She is currently admitted with UTI and confusion, that has improved on antibiotics.  I had a very long  conversation with the patient and stressed at length the importance of having the stent removed.  We cannot remove the stent currently as she has an active UTI.  We also discussed the possibility that the stent cannot be removed in clinic secondary to calcifications, which would then require follow-up in the operating room for complex stent removal and possible ureteroscopy.  Recommendations: -2 weeks culture appropriate antibiotics and fluconazole -I have arranged urology follow-up in clinic in 2 to 3 weeks for stent removal, and stressed the importance of this to the patient -Okay for discharge from urology perspective when clinically back to [place], [place] [address] [place], [place] [address] [phone]

## 2019-07-16 NOTE — NC FL2 (Signed)
[place] LEVEL OF CARE SCREENING TOOL     IDENTIFICATION  Patient Name: [name] Birthdate: 1956-01-19 Sex: female Admission Date (Current Location): 07/14/2019  [place] and [place] Number:  Engineering geologist and Address:  [facility], [address] [place], [place] [address]      Provider Number: [id number]  Attending Physician Name and Address:  Karie Kirks, DO  Relative Name and Phone Number:       Current Level of Care: Hospital Recommended Level of Care: [place] Prior Approval Number:    Date Approved/Denied:   PASRR Number: [id number]  Discharge Plan: SNF    Current Diagnoses: Patient Active Problem List   Diagnosis Date Noted  . Altered mental status 07/14/2019  . GAD (generalized anxiety disorder) 06/11/2019  . Poor memory 06/11/2019  . Constipation 05/25/2019  . Acute medial meniscus tear of left knee 05/25/2019  . AKI (acute kidney injury) ([place]) 05/16/2019  . Closed fracture of left distal fibula 05/16/2019  . Sinus tachycardia 05/05/2019  . Congestive dilated cardiomyopathy ([place]) 04/14/2019  . CAD (coronary artery disease) 04/10/2019  . Closed fracture of lateral malleolus 04/10/2019  . Lumbar facet syndrome (Bilateral) (R>L) 03/09/2019  . Long term (current) use of anticoagulants 03/09/2019  . Unspecified cirrhosis of liver ([place]) 02/11/2019  . Hypoalbuminemia 02/11/2019  . Edema due to hypoalbuminemia 02/11/2019  . Lower extremity edema 01/26/2019  . Prolonged QT interval 12/17/2018  . Hypomagnesemia 12/17/2018  . Uncontrolled type 2 diabetes mellitus ([place]) 12/17/2018  . H/O TIA (transient ischemic attack) and stroke 12/17/2018  . Personal history of surgery to heart and great vessels, presenting hazards to health 12/17/2018  . DDD (degenerative disc disease), lumbar 12/17/2018  . Lumbar intervertebral disc displacement 12/17/2018  . Chronic musculoskeletal pain 12/17/2018  .  Chronic generalized pain 12/17/2018  . Hyponatremia 12/17/2018  . Fibromyalgia syndrome 12/17/2018  . Other intervertebral disc displacement, lumbar region 12/17/2018  . Vitamin D deficiency 11/26/2018  . Elevated C-reactive protein (CRP) 11/26/2018  . Elevated sed rate 11/26/2018  . Chronic low back pain (Primary area of Pain) (Bilateral) (L>R) w/ sciatica (Bilateral) 11/25/2018  . Chronic lower extremity pain (Secondary Area of Pain) (Bilateral) (L>R) 11/25/2018  . Chronic neck pain 11/25/2018  . Pharmacologic therapy 11/25/2018  . Disorder of skeletal system 11/25/2018  . Problems influencing health status 11/25/2018  . Renal mass 10/06/2018  . Chronic intermittent abdominal pain 09/25/2018  . Unspecified abdominal pain 09/25/2018  . Recurrent falls 09/12/2018  . Orthostatic hypotension 08/18/2018  . Normochromic normocytic anemia 08/18/2018  . CKD (chronic kidney disease), stage III 08/18/2018  . History of hypothyroidism 08/14/2018  . Medical non-compliance 08/14/2018  . Diabetic ketoacidosis ([place]) 05/22/2018  . UTI (urinary tract infection) 05/22/2018  . NSTEMI (non-ST elevated myocardial infarction) ([place]) 01/11/2016  . Tachy-brady syndrome ([place]) 12/28/2015  . PAF (paroxysmal atrial fibrillation) ([place]) 11/24/2015  . Type 2 diabetes mellitus with neurologic complication ([place]) 51/70/0174  . S/P cholecystectomy 11/23/2015  . Narcotic abuse ([place]) 11/22/2015  . Chronic superficial gastritis without bleeding 11/22/2015  . History of Clostridium difficile 11/22/2015  . History of TIA (transient ischemic attack) 11/22/2015  . Mixed hyperlipidemia 11/22/2015  . Diverticulosis 11/22/2015  . History of uterine cancer 11/22/2015  . Hypothyroidism, unspecified 11/22/2015  . Intractable cyclical vomiting with nausea 11/22/2015  . Narcotic withdrawal ([place]) 11/11/2015  . Insulin dependent type 2 diabetes mellitus ([place]) 08/31/2015  . Hyperlipidemia 08/31/2015  . Cryptogenic cirrhosis  ([place]) 07/10/2015  .  Atrial fibrillation ([place]) 07/10/2015  . Malnutrition of moderate degree 07/04/2015  . Symptomatic bradycardia 05/27/2015  . Chronic anemia 05/19/2015  . Thrombocytopenia ([place]) 05/19/2015  . Hypokalemia 04/06/2015  . Hyperlipidemia with target LDL less than 100 04/06/2015  . Syncope 03/11/2015  . CVA (cerebral vascular accident) ([place]) 02/15/2015  . Essential hypertension 01/12/2015  . Chronically on opiate therapy 01/12/2015  . S/P Nissen fundoplication (without gastrostomy tube) procedure 05/11/2014  . Dysphagia 04/19/2014  . DEPRESSION/ANXIETY 06/27/2007  . Myofascial pain syndrome 06/27/2007  . Chronic pain syndrome 03/28/2007  . GERD 03/27/2007  . Diverticulosis of large intestine without perforation or abscess without bleeding 03/27/2007  . Proteinuria 03/27/2007    Orientation RESPIRATION BLADDER Height & Weight     Self  Normal Incontinent Weight: 153 lb (69.4 kg) Height:  5' 3"  (160 cm)  BEHAVIORAL SYMPTOMS/MOOD NEUROLOGICAL BOWEL NUTRITION STATUS  (none) (none) Continent Diet(Heart Healthy)  AMBULATORY STATUS COMMUNICATION OF NEEDS Skin   Extensive Assist Verbally Normal                       Personal Care Assistance Level of Assistance  Bathing, Feeding, Dressing Bathing Assistance: Limited assistance Feeding assistance: Independent Dressing Assistance: Limited assistance     Functional Limitations Info  Sight, Hearing, Speech Sight Info: Adequate Hearing Info: Adequate Speech Info: Adequate    SPECIAL CARE FACTORS FREQUENCY  PT (By licensed PT), OT (By licensed OT)     PT Frequency: 5 OT Frequency: 5            Contractures Contractures Info: Not present    Additional Factors Info  Code Status, Allergies Code Status Info: Full Code Allergies Info: Aspirin, Codeine Sulfate, Erythromycin, Prednisone, Rosiglitazone Maleate, Tetanus-diphtheria Toxoids Td           Current Medications (07/16/2019):  This is the current  hospital active medication list Current Facility-Administered Medications  Medication Dose Route Frequency Provider Last Rate Last Dose  . 0.9 %  sodium chloride infusion   Intravenous Continuous Hugelmeyer, Alexis, DO 75 mL/hr at 07/16/19 2671    . acetaminophen (TYLENOL) tablet 650 mg  650 mg Oral Q6H PRN Hugelmeyer, Alexis, DO   650 mg at 07/15/19 2151   Or  . acetaminophen (TYLENOL) suppository 650 mg  650 mg Rectal Q6H PRN Hugelmeyer, Alexis, DO      . bisacodyl (DULCOLAX) EC tablet 5 mg  5 mg Oral Daily PRN Hugelmeyer, Alexis, DO      . cefTRIAXone (ROCEPHIN) 1 g in sodium chloride 0.9 % 100 mL IVPB  1 g Intravenous Q24H Swayze, Ava, DO   Stopped at 07/15/19 1819  . enoxaparin (LOVENOX) injection 30 mg  30 mg Subcutaneous Q24H Hugelmeyer, Alexis, DO   30 mg at 07/15/19 2151  . fluconazole (DIFLUCAN) IVPB 100 mg  100 mg Intravenous Q24H Swayze, Ava, DO 50 mL/hr at 07/16/19 1148 100 mg at 07/16/19 1148  . insulin aspart (novoLOG) injection 0-15 Units  0-15 Units Subcutaneous Q4H Hugelmeyer, Alexis, DO   15 Units at 07/16/19 1140  . insulin detemir (LEVEMIR) injection 25 Units  25 Units Subcutaneous QHS Swayze, Ava, DO      . insulin detemir (LEVEMIR) injection 8 Units  8 Units Subcutaneous Once Swayze, Ava, DO      . magnesium citrate solution 1 Bottle  1 Bottle Oral Once PRN Hugelmeyer, Alexis, DO      . metoprolol tartrate (LOPRESSOR) tablet 12.5 mg  12.5 mg Oral BID Swayze, Ava, DO  12.5 mg at 07/16/19 1057  . senna-docusate (Senokot-S) tablet 1 tablet  1 tablet Oral QHS PRN Hugelmeyer, Alexis, DO      . sertraline (ZOLOFT) tablet 25 mg  25 mg Oral Daily Swayze, Ava, DO   25 mg at 07/16/19 1058  . sodium chloride 0.9 % bolus 1,000 mL  1,000 mL Intravenous Once Arta Silence, MD   Stopped at 07/14/19 1827     Discharge Medications: Please see discharge summary for a list of discharge medications.  Relevant Imaging Results:  Relevant Lab Results:   Additional  Information SSN: [id number]  Annamaria Boots, [place]

## 2019-07-16 NOTE — TOC Initial Note (Signed)
Transition of Care [facility]) - Initial/Assessment Note    Patient Details  Name: [name] MRN: [id number] Date of Birth: [birth year]  Transition of Care [facility]) CM/SW Contact:    Beverly Sessions, RN Phone Number: 07/16/2019, 6:26 PM  Clinical Narrative:                 Patient admitted from home with AMS.  Patient A&O x4 for assessment  Patient states that she lives at home with husband.  No local family support Relies on a friend named Tammy for transportation  Patient states she see a NP [facility] - denies issues obtaining medications  Patient states that she has a RW, 2 canes, and a quad cane in the home Requesting [facility] for discharge.  Brad with [facility] notified  Patient currently open with home health services through [facility].  Corene Cornea with [facility] aware of admission  PT has assessed patient and recommends SNF.  Patient adamantly declines, and is nit agreeable for bed search to be initiated.  Patient will return home with resumption of home health services   Expected Discharge Plan: [place]     Patient Goals and CMS Choice        Expected Discharge Plan and Services Expected Discharge Plan: [place]   Discharge Planning Services: CM Consult   Living arrangements for the past 2 months: Apartment                                      Prior Living Arrangements/Services Living arrangements for the past 2 months: Apartment Lives with:: Spouse Patient language and need for interpreter reviewed:: Yes        Need for Family Participation in Patient Care: Yes (Comment) Care giver support system in place?: Yes (comment) Current home services: Home RN, Home PT Criminal Activity/Legal Involvement Pertinent to Current Situation/Hospitalization: No - Comment as needed  Activities of Daily Living      Permission Sought/Granted                  Emotional  Assessment   Attitude/Demeanor/Rapport: Gracious Affect (typically observed): Accepting Orientation: : Oriented to Self, Oriented to Place, Oriented to  Time, Oriented to Situation      Admission diagnosis:  Acute renal insufficiency [N28.9] Non-traumatic rhabdomyolysis [M62.82] Altered mental status, unspecified altered mental status type [R41.82] Altered mental status [R41.82] Patient Active Problem List   Diagnosis Date Noted  . Altered mental status 07/14/2019  . GAD (generalized anxiety disorder) 06/11/2019  . Poor memory 06/11/2019  . Constipation 05/25/2019  . Acute medial meniscus tear of left knee 05/25/2019  . AKI (acute kidney injury) ([place]) 05/16/2019  . Closed fracture of left distal fibula 05/16/2019  . Sinus tachycardia 05/05/2019  . Congestive dilated cardiomyopathy ([place]) 04/14/2019  . CAD (coronary artery disease) 04/10/2019  . Closed fracture of lateral malleolus 04/10/2019  . Lumbar facet syndrome (Bilateral) (R>L) 03/09/2019  . Long term (current) use of anticoagulants 03/09/2019  . Unspecified cirrhosis of liver ([place]) 02/11/2019  . Hypoalbuminemia 02/11/2019  . Edema due to hypoalbuminemia 02/11/2019  . Lower extremity edema 01/26/2019  . Prolonged QT interval 12/17/2018  . Hypomagnesemia 12/17/2018  . Uncontrolled type 2 diabetes mellitus ([place]) 12/17/2018  . H/O TIA (transient ischemic attack) and stroke 12/17/2018  . Personal history of surgery to heart and  great vessels, presenting hazards to health 12/17/2018  . DDD (degenerative disc disease), lumbar 12/17/2018  . Lumbar intervertebral disc displacement 12/17/2018  . Chronic musculoskeletal pain 12/17/2018  . Chronic generalized pain 12/17/2018  . Hyponatremia 12/17/2018  . Fibromyalgia syndrome 12/17/2018  . Other intervertebral disc displacement, lumbar region 12/17/2018  . Vitamin D deficiency 11/26/2018  . Elevated C-reactive protein (CRP) 11/26/2018  . Elevated sed rate 11/26/2018  . Chronic  low back pain (Primary area of Pain) (Bilateral) (L>R) w/ sciatica (Bilateral) 11/25/2018  . Chronic lower extremity pain (Secondary Area of Pain) (Bilateral) (L>R) 11/25/2018  . Chronic neck pain 11/25/2018  . Pharmacologic therapy 11/25/2018  . Disorder of skeletal system 11/25/2018  . Problems influencing health status 11/25/2018  . Renal mass 10/06/2018  . Chronic intermittent abdominal pain 09/25/2018  . Unspecified abdominal pain 09/25/2018  . Recurrent falls 09/12/2018  . Orthostatic hypotension 08/18/2018  . Normochromic normocytic anemia 08/18/2018  . CKD (chronic kidney disease), stage III 08/18/2018  . History of hypothyroidism 08/14/2018  . Medical non-compliance 08/14/2018  . Diabetic ketoacidosis ([place]) 05/22/2018  . UTI (urinary tract infection) 05/22/2018  . NSTEMI (non-ST elevated myocardial infarction) ([place]) 01/11/2016  . Tachy-brady syndrome ([place]) 12/28/2015  . PAF (paroxysmal atrial fibrillation) ([place]) 11/24/2015  . Type 2 diabetes mellitus with neurologic complication ([place]) 54/36/0677  . S/P cholecystectomy 11/23/2015  . Narcotic abuse ([place]) 11/22/2015  . Chronic superficial gastritis without bleeding 11/22/2015  . History of Clostridium difficile 11/22/2015  . History of TIA (transient ischemic attack) 11/22/2015  . Mixed hyperlipidemia 11/22/2015  . Diverticulosis 11/22/2015  . History of uterine cancer 11/22/2015  . Hypothyroidism, unspecified 11/22/2015  . Intractable cyclical vomiting with nausea 11/22/2015  . Narcotic withdrawal ([place]) 11/11/2015  . Insulin dependent type 2 diabetes mellitus ([place]) 08/31/2015  . Hyperlipidemia 08/31/2015  . Cryptogenic cirrhosis ([place]) 07/10/2015  . Atrial fibrillation ([place]) 07/10/2015  . Malnutrition of moderate degree 07/04/2015  . Symptomatic bradycardia 05/27/2015  . Chronic anemia 05/19/2015  . Thrombocytopenia ([place]) 05/19/2015  . Hypokalemia 04/06/2015  . Hyperlipidemia with target LDL less than 100 04/06/2015  .  Syncope 03/11/2015  . CVA (cerebral vascular accident) ([place]) 02/15/2015  . Essential hypertension 01/12/2015  . Chronically on opiate therapy 01/12/2015  . S/P Nissen fundoplication (without gastrostomy tube) procedure 05/11/2014  . Dysphagia 04/19/2014  . DEPRESSION/ANXIETY 06/27/2007  . Myofascial pain syndrome 06/27/2007  . Chronic pain syndrome 03/28/2007  . GERD 03/27/2007  . Diverticulosis of large intestine without perforation or abscess without bleeding 03/27/2007  . Proteinuria 03/27/2007   PCP:  [name], NP Pharmacy:   [place], [place] [address] [place] [address] Phone: [phone] Fax: [phone]     Social Determinants of Health (SDOH) Interventions    Readmission Risk Interventions Readmission Risk Prevention Plan 07/16/2019 05/17/2019 05/16/2019  Transportation Screening Complete Complete Complete  Medication Review ([place]) Complete Complete Complete  PCP or Specialist appointment within 3-5 days of discharge - Complete -  [place] or [facility] Complete Complete Complete  SW Recovery Care/Counseling Consult - Patient refused -  Palliative Care Screening Not Applicable Not Applicable Not [place] Patient Refused Not Applicable -  Some recent data might be hidden

## 2019-07-16 NOTE — Progress Notes (Signed)
Patient CBG 62, gave OJ, will recheck in 20 minutes.

## 2019-07-17 ENCOUNTER — Inpatient Hospital Stay: Payer: Medicare Other

## 2019-07-17 LAB — CBC WITH DIFFERENTIAL/PLATELET
Abs Immature Granulocytes: 0.02 10*3/uL (ref 0.00–0.07)
Basophils Absolute: 0 10*3/uL (ref 0.0–0.1)
Basophils Relative: 0 %
Eosinophils Absolute: 0.1 10*3/uL (ref 0.0–0.5)
Eosinophils Relative: 2 %
HCT: 30.3 % — ABNORMAL LOW (ref 36.0–46.0)
Hemoglobin: 9.3 g/dL — ABNORMAL LOW (ref 12.0–15.0)
Immature Granulocytes: 0 %
Lymphocytes Relative: 11 %
Lymphs Abs: 0.6 10*3/uL — ABNORMAL LOW (ref 0.7–4.0)
MCH: 25.8 pg — ABNORMAL LOW (ref 26.0–34.0)
MCHC: 30.7 g/dL (ref 30.0–36.0)
MCV: 84.2 fL (ref 80.0–100.0)
Monocytes Absolute: 0.3 10*3/uL (ref 0.1–1.0)
Monocytes Relative: 5 %
Neutro Abs: 4.6 10*3/uL (ref 1.7–7.7)
Neutrophils Relative %: 82 %
Platelets: 151 10*3/uL (ref 150–400)
RBC: 3.6 MIL/uL — ABNORMAL LOW (ref 3.87–5.11)
RDW: 14.6 % (ref 11.5–15.5)
WBC: 5.6 10*3/uL (ref 4.0–10.5)
nRBC: 0 % (ref 0.0–0.2)

## 2019-07-17 LAB — GLUCOSE, CAPILLARY
Glucose-Capillary: 133 mg/dL — ABNORMAL HIGH (ref 70–99)
Glucose-Capillary: 184 mg/dL — ABNORMAL HIGH (ref 70–99)
Glucose-Capillary: 201 mg/dL — ABNORMAL HIGH (ref 70–99)
Glucose-Capillary: 205 mg/dL — ABNORMAL HIGH (ref 70–99)
Glucose-Capillary: 209 mg/dL — ABNORMAL HIGH (ref 70–99)
Glucose-Capillary: 261 mg/dL — ABNORMAL HIGH (ref 70–99)
Glucose-Capillary: 272 mg/dL — ABNORMAL HIGH (ref 70–99)

## 2019-07-17 LAB — BASIC METABOLIC PANEL
Anion gap: 5 (ref 5–15)
BUN: 20 mg/dL (ref 8–23)
CO2: 19 mmol/L — ABNORMAL LOW (ref 22–32)
Calcium: 8 mg/dL — ABNORMAL LOW (ref 8.9–10.3)
Chloride: 112 mmol/L — ABNORMAL HIGH (ref 98–111)
Creatinine, Ser: 1.16 mg/dL — ABNORMAL HIGH (ref 0.44–1.00)
GFR calc Af Amer: 57 mL/min — ABNORMAL LOW (ref >60)
GFR calc non Af Amer: 49 mL/min — ABNORMAL LOW (ref >60)
Glucose, Bld: 192 mg/dL — ABNORMAL HIGH (ref 70–99)
Potassium: 4.1 mmol/L (ref 3.5–5.1)
Sodium: 136 mmol/L (ref 135–145)

## 2019-07-17 MED ORDER — OXYCODONE HCL 5 MG PO TABS
5.0000 mg | ORAL_TABLET | ORAL | Status: DC | PRN
  Administered 2019-07-17 – 2019-07-18 (×4): 5 mg via ORAL
  Filled 2019-07-17 (×4): qty 1

## 2019-07-17 MED ORDER — ENOXAPARIN SODIUM 40 MG/0.4ML ~~LOC~~ SOLN
40.0000 mg | SUBCUTANEOUS | Status: DC
  Administered 2019-07-17: 22:00:00 40 mg via SUBCUTANEOUS
  Filled 2019-07-17: qty 0.4

## 2019-07-17 MED ORDER — SUCRALFATE 1 G PO TABS
1.0000 g | ORAL_TABLET | Freq: Three times a day (TID) | ORAL | Status: DC
  Administered 2019-07-17 – 2019-07-18 (×3): 1 g via ORAL
  Filled 2019-07-17 (×3): qty 1

## 2019-07-17 NOTE — Progress Notes (Addendum)
PROGRESS NOTE  [name] NOM:[id number] DOB: 30-Dec-1955 DOA: 07/14/2019 PCP: [name], NP  Brief History   [name] is a 63 y.o. female with a known history of HTN, GERD, DM, PAF on Eliquis, presents to the emergency department for evaluation of AMS.  Patient was in a usual state of health until the past few weeks she has become increasingly weak. With decreased appetite, multiple falls and confusion. Patient responds "I don't know" to all questions. .   Patient received NS in the ED. Medical admission was requested for further management of AMS, acute encephalopathy.  The patient was admitted to a telemetry bed. Her potassium was supplemented and made replete. She has been ruled out for MI by EKG and enzyme criteria. She received a 1 liter bolus of IV NS resulting in a small decline in creatinine. UA and urine culture have been ordered . She is being treated with IV Rocephin. Urine culture is pending. Blood cultures have been obtained and have had no growth.  Renal ultrasound was obtained to evaluate the patient for possible obstructive uropathy. It demonstrated right sided hydroureter and stent that was placed at the beginning of 2020 that was to have been removed in February. However, the patient cancelled this appointment, and did not rescheduled. I have contacted the patient's urologist, Dr. Diamantina Providence. He recommends that the patient remain inpatient to receive her IV antibiotics and antifungal. He will see the patient as outpatient for removal of stent and to address hydroureter. He has arranged follow up in 2-3 weeks.  Consultants  Urology  Procedures  None  Antibiotics   Anti-infectives (From admission, onward)    Start     Dose/Rate Route Frequency Ordered Stop   07/16/19 1100  fluconazole (DIFLUCAN) IVPB 100 mg     100 mg 50 mL/hr over 60 Minutes Intravenous Every 24 hours 07/16/19 1000     07/15/19 1800  cefTRIAXone (ROCEPHIN) 1 g in sodium chloride 0.9 % 100  mL IVPB     1 g 200 mL/hr over 30 Minutes Intravenous Every 24 hours 07/15/19 1508        Subjective  The patient is resting comfortably upon my visit. However, not long ago I received a message from nursing that the patient has a cough productive of sputum and is complaining of epigastric pain.  Objective   Vitals:  Vitals:   07/17/19 0412 07/17/19 0812  BP: (!) 166/77 137/77  Pulse: 96 94  Resp: 16   Temp: 98.5 F (36.9 C) 98.5 F (36.9 C)  SpO2: 98% 98%   Exam:  Constitutional:  The patient is awake, and somewhat confused. No acute distress. Respiratory:  No increased work of breathing. No wheezes, rales, or rhonchi No tactile fremitus Cardiovascular:  Regular rate and rhythm No murmurs, ectopy, or gallups. No lateral PMI. No thrills. Abdomen:  Abdomen is soft, non-tender, non-distended No hernias, masses, or organomegaly Normoactive bowel sounds.  Musculoskeletal:  No cyanosis, clubbing, or edema Skin:  No rashes, lesions, ulcers palpation of skin: no induration or nodules Neurologic:  CN 2-12 intact Sensation all 4 extremities intact Psychiatric:  Mental status Mood, affect appropriate Orientation to person, place, time  judgment and insight appear intact  I have personally reviewed the following:   Today's Data  Vitals, BMP, CBC, Glucoses  Lab Data  UA positive for large LES, no nitrites, but positive for bacteria  Renal Ultrasound: Right sided hydroureter, stent in right ureter.  CXR: pending.  Micro Data  COVID-19 - negative, HIV - NR Blood culture x 2  - no growth  Urine culture is pending.  Scheduled Meds:  enoxaparin (LOVENOX) injection  30 mg Subcutaneous Q24H   insulin aspart  0-15 Units Subcutaneous Q4H   insulin detemir  25 Units Subcutaneous QHS   metoprolol tartrate  12.5 mg Oral BID   sertraline  25 mg Oral Daily   sucralfate  1 g Oral TID WC & HS   Continuous Infusions:  sodium chloride 75 mL/hr at 07/17/19 0644    cefTRIAXone (ROCEPHIN)  IV 1 g (07/16/19 1830)   fluconazole (DIFLUCAN) IV 100 mg (07/17/19 1031)   sodium chloride Stopped (07/14/19 1827)    Active Problems:   Altered mental status   LOS: 3 days   A & P   AMS: DDx - Toxic/metabolic vs. Uremia. The patient was on oxycodone at home. This has been held here. She has no complaints of pain. UA appears possibly positive, urine culture is pending. Patient admits to frequent urges to urinate. Blood cultures x 2 have had no growth. Exact cause of encephalopathy is clinically undetermined.   Acute kidney injury on CKD 2: Continue Cautious IV fluids. (EF on echocardiogram from 12/2017 demonstrated an EF  Of 55 - 60% with no evidence of diastolic dysfunction). Monitor creatinine, electrolytes, and volume status. Avoid nephrotoxic medications and hypotension. Renal ultrasound performed. It demonstrates right hydroureter and stent in right ureter from the beginning of 2020. I have contacted Dr. Diamantina Providence and consulted him. He stated that he would send his PA, but that pt should complete IV antibiotics and antifungals. They she should follow up with him as outpatient. Bladder scan and place foley catheter if evidence of urinary retention.  Epigastric pain: The patient has a history of GERD and hiatal hernia as well as gastric ulcer. She has been on Carafate in the past. I will restart it.  Cough: Lung sounds are clear. I suspect that this is due to post nasal drip and upper airway secretions as her lung exam was benign. Await results of CXR. If negative will start flonase.  Mild hypokalemia: Supplement and moniotr. Likely to recur with resolution of AKI. Monitor.   Elevated troponin: Likely secondary to AKI. Troponins have remained flat. The patient does not complain of chest pain. EKG with without ischemic changes.     Essential HTN: Blood pressures are low - normotensive without antihypertensives. Monitor.   Paroxysmal Atrial Fibrillation: Tachycardia  is resolved with low dose metoprolol. Continue eliquis.   DM II: Hemeglobin A1c 9.7 in 05/2019. The patient's glucoses will be managed by continuing the patient's lantus as at home with FSBS and SSI. She will be on a heart healthy, carb controlled diet and a hypoglycemic protocol. FSBS has run 62 - 351 today. The patient was hypoglycemic this morning at 62 with 351 early this afternoon. Will supplement with 8 units of NPH pending initiation of lantus this evening.  I have seen and examined this patient myself. I have spent 38 minutes in her evaluation and care.   Admission status: Inpatient, tele IV Fluids: NS Diet/Nutrition: Heart healthy carb controlled Consults called: None  Carrera Kiesel, DO [facility] Direct contact: see [email or web address]  7PM-7AM contact night coverage as above 07/17/2019, 12:42 PM  LOS: 1 day

## 2019-07-17 NOTE — TOC Progression Note (Signed)
Transition of Care [facility]) - Progression Note    Patient Details  Name: [name] MRN: [id number] Date of Birth: Sep 02, 1956  Transition of Care [facility]) CM/SW Contact  Beverly Sessions, RN Phone Number: 07/17/2019, 4:51 PM  Clinical Narrative:    BSC delivered to room by [facility] with [facility]   Expected Discharge Plan: [place]    Expected Discharge Plan and Services Expected Discharge Plan: [place]   Discharge Planning Services: CM Consult   Living arrangements for the past 2 months: Apartment                                       Social Determinants of Health (SDOH) Interventions    Readmission Risk Interventions Readmission Risk Prevention Plan 07/16/2019 05/17/2019 05/16/2019  Transportation Screening Complete Complete Complete  Medication Review Press photographer) Complete Complete Complete  PCP or Specialist appointment within 3-5 days of discharge - Complete -  [place] or [place] Complete Complete Complete  SW Recovery Care/Counseling Consult - Patient refused -  Palliative Care Screening Not Applicable Not Applicable Not [place] Patient Refused Not Applicable -  Some recent data might be hidden

## 2019-07-17 NOTE — Progress Notes (Signed)
OT Cancellation Note  Patient Details Name: [name] MRN: [id number] DOB: 1955/10/21   Cancelled Treatment:    Reason Eval/Treat Not Completed: Patient at procedure or test/ unavailable. OT attempted to see pt for tx this date. Upon arrival to pt room, pt with hospital staff in room for procedure. Will re-attempt at a later time/date as available and pt medially appropriate for OT tx.   Shara Blazing, M.S., OTR/L Ascom: [phone] 07/17/19, 2:10 PM

## 2019-07-17 NOTE — Progress Notes (Signed)
Physical Therapy Treatment Patient Details Name: [name] MRN: [id number] DOB: [birth year] Today's Date: 07/17/2019    History of Present Illness Pt admitted for AMS with complaints of weakness and multiple falls, most recent fall occured yesterday and pt was unable get up off the floor. Other PMH includes HTN, GERD, and DM.     PT Comments    Pt able to progress walking distance with RW today as well as performance of seated there ex with little difficulty.  She was able to perform bed mobility mod I and stand from bedside without difficulty.  Pt increased ambulatory distance to 120 ft without report of fatigue.  She presented with a gait pattern indicating minimal fall risk.  Pt able to sit at EOB and follow directions for there ex without difficulty, completing all exercises without physical assistance.  Pt will continue to benefit from skilled PT with focus on strength, tolerance to activity and safe functional mobility.   Follow Up Recommendations  Home health PT;Supervision - Intermittent(however pt prefers to go home)     Equipment Recommendations  None recommended by PT    Recommendations for Other Services       Precautions / Restrictions Precautions Precautions: Fall Restrictions Weight Bearing Restrictions: No    Mobility  Bed Mobility Overal bed mobility: Modified Independent             General bed mobility comments: Increased time.  Transfers Overall transfer level: Needs assistance Equipment used: Rolling walker (2 wheeled) Transfers: Sit to/from Stand Sit to Stand: Supervision         General transfer comment: Slow to stand but able to do so without physical assistance.  Ambulation/Gait Ambulation/Gait assistance: Min guard Gait Distance (Feet): 120 Feet Assistive device: Rolling walker (2 wheeled)     Gait velocity interpretation: 1.31 - 2.62 ft/sec, indicative of limited community ambulator General Gait Details: Fair foot clearance and  step length.  Good use of RW and no report of fatigue.   Stairs             Wheelchair Mobility    Modified Rankin (Stroke Patients Only)       Balance Overall balance assessment: Modified Independent                                          Cognition Arousal/Alertness: Awake/alert Behavior During Therapy: WFL for tasks assessed/performed Overall Cognitive Status: Within Functional Limits for tasks assessed                                        Exercises Other Exercises Other Exercises: Seated ther ex: toe taps x20, LAQ's, marching, pillow squeezes x10.    General Comments        Pertinent Vitals/Pain Pain Assessment: No/denies pain    Home Living                      Prior Function            PT Goals (current goals can now be found in the care plan section) Acute Rehab PT Goals Patient Stated Goal: to go home today PT Goal Formulation: With patient Time For Goal Achievement: 07/29/19 Potential to Achieve Goals: Good Progress towards PT goals: Progressing toward goals    Frequency  Min 2X/week      PT Plan Discharge plan needs to be updated    Co-evaluation              AM-PAC PT "6 Clicks" Mobility   Outcome Measure  Help needed turning from your back to your side while in a flat bed without using bedrails?: A Little Help needed moving from lying on your back to sitting on the side of a flat bed without using bedrails?: A Little Help needed moving to and from a bed to a chair (including a wheelchair)?: A Little Help needed standing up from a chair using your arms (e.g., wheelchair or bedside chair)?: A Little Help needed to walk in hospital room?: A Lot Help needed climbing 3-5 steps with a railing? : A Lot 6 Click Score: 16    End of Session Equipment Utilized During Treatment: Gait belt Activity Tolerance: Patient tolerated treatment well Patient left: in bed;with call bell/phone  within reach;with bed alarm set Nurse Communication: Mobility status PT Visit Diagnosis: Unsteadiness on feet (R26.81);Repeated falls (R29.6);Muscle weakness (generalized) (M62.81);History of falling (Z91.81);Difficulty in walking, not elsewhere classified (R26.2)     Time: 6073-7106 PT Time Calculation (min) (ACUTE ONLY): 16 min  Charges:  $Therapeutic Exercise: 8-22 mins                     Roxanne Gates, PT, DPT   Roxanne Gates 07/17/2019, 5:04 PM

## 2019-07-17 NOTE — Progress Notes (Signed)
Pt c/o of epigastric pain aggravated with food and drink. New order received.

## 2019-07-17 NOTE — Care Management Important Message (Signed)
Important Message  Patient Details  Name: [name] MRN: [id number] Date of Birth: 1956-08-10   Medicare Important Message Given:  Yes     [name] 07/17/2019, 12:36 PM

## 2019-07-18 DIAGNOSIS — R778 Other specified abnormalities of plasma proteins: Secondary | ICD-10-CM

## 2019-07-18 LAB — GLUCOSE, CAPILLARY
Glucose-Capillary: 122 mg/dL — ABNORMAL HIGH (ref 70–99)
Glucose-Capillary: 145 mg/dL — ABNORMAL HIGH (ref 70–99)
Glucose-Capillary: 151 mg/dL — ABNORMAL HIGH (ref 70–99)

## 2019-07-18 MED ORDER — CEPHALEXIN 500 MG PO CAPS: 500.0000 mg | ORAL_CAPSULE | Freq: Four times a day (QID) | ORAL | 0 refills | Status: AC

## 2019-07-18 MED ORDER — SUCRALFATE 1 G PO TABS: 1.0000 g | ORAL_TABLET | Freq: Three times a day (TID) | ORAL | 0 refills | Status: DC

## 2019-07-18 MED ORDER — FLUCONAZOLE 100 MG PO TABS: 100.0000 mg | ORAL_TABLET | Freq: Every day | ORAL | 0 refills | Status: AC

## 2019-07-18 MED ORDER — METOPROLOL TARTRATE 25 MG PO TABS: 12.5000 mg | ORAL_TABLET | Freq: Two times a day (BID) | ORAL | 0 refills | Status: DC

## 2019-07-18 NOTE — TOC Transition Note (Signed)
Transition of Care [facility]) - CM/SW Discharge Note   Patient Details  Name: [name] MRN: [id number] Date of Birth: [birth year]  [facility]) CM/SW Contact:  Latanya Maudlin, RN Phone Number: 07/18/2019, 10:32 AM   Clinical Narrative:   Patient to be discharged per MD order. Orders in place for home health services. Patient was previously [facility] with [facility].Notified Corene Cornea of discharge. DME rolling walker and bedside commode was delivered via [facility]. Family to transport.      Final next level of care: Home w Home Health Services Barriers to Discharge: No Barriers Identified   Patient Goals and CMS Choice   CMS Medicare.gov Compare Post Acute Care list provided to:: Patient Choice offered to / list presented to : Patient  Discharge Placement                       Discharge Plan and Services   Discharge Planning Services: CM Consult            DME Arranged: Bedside commode, Walker rolling DME Agency: [facility]       HH Arranged: RN, PT, Nurse's Aide [place] Agency: [place] ([facility]) Date [place]: 07/18/19 Time [place]: 1031 Representative spoke with at [place]: [place] ([place]) Interventions     Readmission Risk Interventions Readmission Risk Prevention Plan 07/18/2019 07/16/2019 05/17/2019  Transportation Screening Complete Complete Complete  Medication Review Press photographer) Complete Complete Complete  PCP or Specialist appointment within 3-5 days of discharge Complete - Complete  HRI or Home Care Consult Complete Complete Complete  SW Recovery Care/Counseling Consult Complete - Patient refused  Palliative Care Screening Not Applicable Not Applicable Not [place] Not Applicable Patient Refused Not Applicable  Some recent data might be hidden

## 2019-07-18 NOTE — Discharge Summary (Addendum)
Physician Discharge Summary  [name] YQM:[id number] DOB: 03-10-1956 DOA: 07/14/2019  PCP: [name], NP  Admit date: 07/14/2019 Discharge date: 07/18/2019  Recommendations for Outpatient Follow-up:  Follow up with PCP in 7-10 days. Follow up with Urology as directed CMP to be drawn on 06/21/2019 and reported to PCP.  Follow-up Information     [name], NP Follow up in 1 week(s).   Specialty: Internal Medicine Contact information: [place] [address] [phone]         Billey Co, MD Follow up.   Specialty: Urology Why: As directed. Contact information: [place] [address] [phone]           Discharge Diagnoses: Principal diagnosis is #1 Altered mental status Failure to thrive AKI Hypokalemia Paroxysmal Atrial Fibrillation DM II UTI Hydronephrosis Ureteral stent Elevated troponin  Discharge Condition: Fair  Disposition: Home with home health  Diet recommendation: Heart healthy with controlled carbohydrate  Filed Weights   07/14/19 1458 07/14/19 2227  Weight: 68.9 kg 69.4 kg    History of present illness: [name] is a 63 y.o. female with a known history of HTN, GERD, DM, PAF on Eliquis, presents to the emergency department for evaluation of AMS.  Patient was in a usual state of health until the past few weeks she has become increasingly weak. With decreased appetite, multiple falls and confusion. Patient responds "I don't know" to all questions. .   Patient received NS in the ED. Medical admission was requested for further management of AMS, acute encephalopathy.   Hospital Course:  The patient was admitted to a telemetry bed. Her potassium was supplemented and made replete. She has been ruled out for MI by EKG and enzyme criteria. She received a 1 liter bolus of IV NS resulting in a small decline in creatinine. UA and urine culture have been ordered . She is being  treated with IV Rocephin. Urine culture is pending. Blood cultures have been obtained and have had no growth.   Renal ultrasound was obtained to evaluate the patient for possible obstructive uropathy. It demonstrated right sided hydroureter and stent that was placed at the beginning of 2020 that was to have been removed in February. However, the patient cancelled this appointment, and did not rescheduled. I have contacted the patient's urologist, Dr. Diamantina Providence. He recommends that the patient remain inpatient to receive her IV antibiotics and antifungal. He will see the patient as outpatient for removal of stent and to address hydroureter. He has arranged follow up in 2-3 weeks.  Today's assessment: S: The patient is resting comfortably. No new complaints. O: Vitals:  Vitals:   07/17/19 2119 07/18/19 0442  BP: (!) 155/79 (!) 170/87  Pulse: (!) 103 100  Resp: 18 19  Temp: 98.4 F (36.9 C) 98.5 F (36.9 C)  SpO2: 100% 95%   Exam:  Constitutional:  The patient is awake, alert, and oriented x 3. No acute distress. Respiratory:  No increased work of breathing. No wheezes, rales, or rhonchi No tactile fremitus Cardiovascular:  Regular rate and rhythm No murmurs, ectopy, or gallups. No lateral PMI. No thrills. Abdomen:  Abdomen is soft, non-tender, non-distended No hernias, masses, or organomegaly Normoactive bowel sounds.  Musculoskeletal:  No cyanosis, clubbing, or edema Skin:  No rashes, lesions, ulcers palpation of skin: no induration or nodules Neurologic:  CN 2-12 intact Sensation all 4 extremities intact Psychiatric:  Mental status Mood, affect appropriate Orientation to person, place,  time  judgment and insight appear intact  Discharge Instructions  Discharge Instructions     Activity as tolerated - No restrictions   Complete by: As directed    Call MD for:  difficulty breathing, headache or visual disturbances   Complete by: As directed    Call MD for:  extreme  fatigue   Complete by: As directed    Call MD for:  persistant dizziness or light-headedness   Complete by: As directed    Diet - low sodium heart healthy   Complete by: As directed    Diet Carb Modified   Complete by: As directed    Discharge instructions   Complete by: As directed    Follow up with PCP in 7-10 days. Home health PT/OT. Follow up with urology as directed. BMP to be drawn on 07/22/2019 and reported to PCP.   Increase activity slowly   Complete by: As directed       Allergies as of 07/18/2019       Reactions   Aspirin Rash   Codeine Sulfate Rash   Erythromycin Rash   Reaction:  Fever    Prednisone Swelling   Rosiglitazone Maleate Swelling   Tetanus-diphtheria Toxoids Td Rash, Other (See Comments)   Reaction:  Fever         Medication List     STOP taking these medications    metoprolol succinate 25 MG 24 hr tablet Commonly known as: TOPROL-XL       TAKE these medications    albuterol 108 (90 Base) MCG/ACT inhaler Commonly known as: VENTOLIN HFA Inhale 2 puffs into the lungs every 6 (six) hours as needed for wheezing or shortness of breath. Notes to patient: As needed   apixaban 5 MG Tabs tablet Commonly known as: ELIQUIS Take 5 mg by mouth 2 (two) times daily. Notes to patient: Before bed 07/18/19   cephALEXin 500 MG capsule Commonly known as: KEFLEX Take 1 capsule (500 mg total) by mouth 4 (four) times daily for 2 days. Notes to patient: Afternoon 07/18/19   cyclobenzaprine 10 MG tablet Commonly known as: FLEXERIL Take 1 tablet (10 mg total) by mouth 3 (three) times daily as needed for muscle spasms. Must last 30 days. Start taking on: August 10, 2019 Notes to patient: As needed   fluconazole 100 MG tablet Commonly known as: Diflucan Take 1 tablet (100 mg total) by mouth daily for 4 days. Notes to patient: Morning 07/19/19   Insulin Detemir 100 UNIT/ML Pen Commonly known as: LEVEMIR Inject 30 units in the morning and 30 units at  bedtime. What changed: additional instructions Notes to patient: Before bed 07/18/19   metoprolol tartrate 25 MG tablet Commonly known as: LOPRESSOR Take 0.5 tablets (12.5 mg total) by mouth 2 (two) times daily. Notes to patient: Before bed 07/18/19   oxyCODONE 5 MG immediate release tablet Commonly known as: Oxy IR/ROXICODONE Take 1 tablet (5 mg total) by mouth every 4 (four) hours as needed for severe pain. Must last 30 days. Max: 6/day Notes to patient: As needed   oxyCODONE 5 MG immediate release tablet Commonly known as: Oxy IR/ROXICODONE Take 1 tablet (5 mg total) by mouth every 4 (four) hours as needed for severe pain. Must last 30 days. Max: 6/day Start taking on: August 12, 2019 Notes to patient: As needed   oxyCODONE 5 MG immediate release tablet Commonly known as: Oxy IR/ROXICODONE Take 1 tablet (5 mg total) by mouth every 4 (four) hours as needed for severe  pain. Must last 30 days. Max: 6/day Start taking on: September 11, 2019 Notes to patient: As needed   sertraline 25 MG tablet Commonly known as: ZOLOFT TAKE 1 TABLET AT BEDTIME FOR ANXIETY   sucralfate 1 g tablet Commonly known as: CARAFATE Take 1 tablet (1 g total) by mouth 4 (four) times daily -  with meals and at bedtime. Notes to patient: Before bed 07/18/19               Durable Medical Equipment  (From admission, onward)           Start     Ordered   07/17/19 1156  For home use only DME Bedside commode  Once    Question:  Patient needs a bedside commode to treat with the following condition  Answer:  Weakness generalized   07/17/19 1156           Allergies  Allergen Reactions   Aspirin Rash   Codeine Sulfate Rash   Erythromycin Rash    Reaction:  Fever    Prednisone Swelling   Rosiglitazone Maleate Swelling   Tetanus-Diphtheria Toxoids Td Rash and Other (See Comments)    Reaction:  Fever     The results of significant diagnostics from this hospitalization (including imaging,  microbiology, ancillary and laboratory) are listed below for reference.    Significant Diagnostic Studies: Dg Pelvis 1-2 Views  Result Date: 07/14/2019 CLINICAL DATA:  Bilateral pelvic and knee pain after multiple recent falls. EXAM: PELVIS - 1-2 VIEW COMPARISON:  12/15/2017 FINDINGS: A right ureteral stent is again partially visualized terminating in the midline of the lower pelvis in the expected region of the bladder, unchanged. No acute fracture or pelvic diastasis is identified. Lumbar levoscoliosis is partially visualized. IMPRESSION: No acute osseous abnormality identified. Electronically Signed   By: Logan Bores M.D.   On: 07/14/2019 17:48   Dg Knee 2 Views Left  Result Date: 07/14/2019 CLINICAL DATA:  Knee pain after fall EXAM: LEFT KNEE - 1-2 VIEW COMPARISON:  05/17/2019 FINDINGS: No fracture or malalignment. Mild patellofemoral and medial joint space degenerative change. Trace knee effusion. Mild sclerosis in the proximal metaphysis of the tibia, likely corresponding to MRI demonstrated bone infarct. IMPRESSION: 1. No acute osseous abnormality 2. Mild arthritis of the knee with small knee effusion Electronically Signed   By: Donavan Foil M.D.   On: 07/14/2019 17:46   Dg Knee 2 Views Right  Result Date: 07/14/2019 CLINICAL DATA:  Bilateral knee pain after fall EXAM: RIGHT KNEE - 1-2 VIEW COMPARISON:  01/08/2007 FINDINGS: No acute displaced fracture or malalignment. No significant knee effusion. Mild medial joint space degenerative change. IMPRESSION: No acute osseous abnormality Electronically Signed   By: Donavan Foil M.D.   On: 07/14/2019 17:45   Ct Head Wo Contrast  Result Date: 07/14/2019 CLINICAL DATA:  Altered level of consciousness. History of multiple falls. EXAM: CT HEAD WITHOUT CONTRAST TECHNIQUE: Contiguous axial images were obtained from the base of the skull through the vertex without intravenous contrast. COMPARISON:  Head CT 05/22/2018 FINDINGS: Brain: The ventricles are  normal in size and configuration. No extra-axial fluid collections are identified. The gray-white differentiation is maintained. No CT findings for acute hemispheric infarction or intracranial hemorrhage. No mass lesions. The brainstem and cerebellum are normal. Vascular: No hyperdense vessels or obvious aneurysm. Skull: No acute skull fracture. No bone lesion. Hyperostosis frontalis interna again noted. Sinuses/Orbits: The paranasal sinuses and mastoid air cells are clear. The globes are intact. Other: No  scalp lesions, laceration or hematoma. IMPRESSION: No acute intracranial findings or mass lesion. Electronically Signed   By: Marijo Sanes M.D.   On: 07/14/2019 17:29   US Renal  Result Date: 07/15/2019 CLINICAL DATA:  Acute renal insufficiency. Ureteral stent 07/14/2016 EXAM: RENAL / URINARY TRACT ULTRASOUND COMPLETE COMPARISON:  05/16/2019 FINDINGS: Right Kidney: Renal measurements: 8.8 x 4.7 x 4.8 cm = volume: 104 mL . Echogenicity within normal limits. No mass visualized. Mild right-sided hydronephrosis slightly worse. Known right ureteral stent difficult to visualize. Left Kidney: Renal measurements: 12.2 x 5.0 x 5.0 cm = volume: 162 mL. Echogenicity within normal limits. No mass or hydronephrosis visualized. Bladder: Bladder mildly full with presence of ureteral stent visualized. Mild dependent mobile debris unchanged. Other: None. IMPRESSION: Right kidney slightly smaller than left. Mild right-sided hydronephrosis slightly worse. Mild dependent mobile debris over the bladder unchanged. Visualization of distal portion of patient's ureteral stent over the bladder. Electronically Signed   By: Marin Olp M.D.   On: 07/15/2019 16:07   US Arterial Abi (screening Lower Extremity)  Result Date: 07/16/2019 CLINICAL DATA:  63 year old female with left foot discoloration EXAM: NONINVASIVE PHYSIOLOGIC VASCULAR STUDY OF BILATERAL LOWER EXTREMITIES TECHNIQUE: Evaluation of both lower extremities were  performed at rest, including calculation of ankle-brachial indices with single level Doppler, pressure and pulse volume recording. COMPARISON:  None. FINDINGS: Right ABI:  1.1 Left ABI:  1.1 Right Lower Extremity:  Normal arterial waveforms at the ankle. Left Lower Extremity:  Normal arterial waveforms at the ankle. 1.0-1.4 Normal IMPRESSION: Normal exam. Signed, Criselda Peaches, MD, RPVI [facility] Electronically Signed   By: Jacqulynn Cadet M.D.   On: 07/16/2019 16:45   Dg Chest Port 1 View  Result Date: 07/17/2019 CLINICAL DATA:  Cough 1 day. EXAM: PORTABLE CHEST 1 VIEW COMPARISON:  07/14/2019 FINDINGS: Lungs are hypoinflated without focal lobar consolidation or effusion. Subtle prominence of the perihilar markings likely due to the degree of hypoinflation and unchanged. Cardiomediastinal silhouette and remainder of the exam is unchanged. IMPRESSION: Hypoinflation without acute cardiopulmonary disease. Electronically Signed   By: Marin Olp M.D.   On: 07/17/2019 15:25   Dg Chest Portable 1 View  Result Date: 07/14/2019 CLINICAL DATA:  Worsening weakness. EXAM: PORTABLE CHEST 1 VIEW COMPARISON:  11/26/2018 FINDINGS: The cardiac silhouette, mediastinal and hilar contours are within normal limits given the AP projection, portable technique and low lung volumes. Mild tortuosity and calcification of the thoracic aorta. Low lung volumes with vascular crowding and streaky basilar atelectasis. No infiltrates or effusions. No pulmonary lesions. The bony thorax is intact. IMPRESSION: Low lung volumes with vascular crowding and atelectasis but no definite infiltrates or effusions. Electronically Signed   By: Marijo Sanes M.D.   On: 07/14/2019 19:19    Microbiology: Recent Results (from the past 240 hour(s))  SARS CORONAVIRUS 2 (TAT 6-24 HRS) Nasopharyngeal Nasopharyngeal Swab     Status: None   Collection Time: 07/14/19  4:59 PM   Specimen:  Nasopharyngeal Swab  Result Value Ref Range Status   SARS Coronavirus 2 NEGATIVE NEGATIVE Final    Comment: (NOTE) SARS-CoV-2 target nucleic acids are NOT DETECTED. The SARS-CoV-2 RNA is generally detectable in upper and lower respiratory specimens during the acute phase of infection. Negative results do not preclude SARS-CoV-2 infection, do not rule out co-infections with other pathogens, and should not be used as the sole basis for treatment or other patient management decisions. Negative results must be combined with clinical  observations, patient history, and epidemiological information. The expected result is Negative. Fact Sheet for Patients: SugarRoll.be Fact Sheet for Healthcare Providers: https://www.woods-mathews.com/ This test is not yet approved or cleared by the [place] FDA and  has been authorized for detection and/or diagnosis of SARS-CoV-2 by FDA under an Emergency Use Authorization (EUA). This EUA will remain  in effect (meaning this test can be used) for the duration of the COVID-19 declaration under Section 56 4(b)(1) of the Act, 21 U.S.C. section 360bbb-3(b)(1), unless the authorization is terminated or revoked sooner. Performed at [facility], [place] [address] [place], [place] [address]   CULTURE, BLOOD (ROUTINE X 2) w Reflex to ID Panel     Status: None (Preliminary result)   Collection Time: 07/15/19  8:16 AM   Specimen: BLOOD  Result Value Ref Range Status   Specimen Description BLOOD LEFT HAND  Final   Special Requests   Final    BOTTLES DRAWN AEROBIC AND ANAEROBIC Blood Culture adequate volume   Culture   Final    NO GROWTH < 24 HOURS Performed at [facility], [address] [place], [place] [address]    Report Status PENDING  Incomplete  CULTURE, BLOOD (ROUTINE X 2) w Reflex to ID Panel     Status: None (Preliminary result)   Collection Time: 07/15/19  9:55 AM   Specimen: BLOOD   Result Value Ref Range Status   Specimen Description BLOOD BLOOD LEFT HAND  Final   Special Requests   Final    BOTTLES DRAWN AEROBIC AND ANAEROBIC Blood Culture adequate volume   Culture   Final    NO GROWTH < 24 HOURS Performed at [facility], [address] [place], [place] [address]    Report Status PENDING  Incomplete  Urine Culture     Status: Abnormal   Collection Time: 07/15/19  2:02 PM   Specimen: Urine, Random  Result Value Ref Range Status   Specimen Description   Final    URINE, RANDOM Performed at [facility], [address] [place], [place] [address]    Special Requests   Final    NONE Performed at [facility], [address] [place], [place] [address]    Culture MULTIPLE SPECIES PRESENT, SUGGEST RECOLLECTION (A)  Final   Report Status 07/16/2019 FINAL  Final     Labs: Basic Metabolic Panel: Recent Labs  Lab 07/14/19 1509 07/14/19 1642 07/15/19 0433 07/16/19 0449 07/17/19 0506  NA 136  --  139 136 136  K 3.3*  --  3.9 3.3* 4.1  CL 102  --  108 108 112*  CO2 22  --  22 20* 19*  GLUCOSE 170*  --  95 123* 192*  BUN 25*  --  31* 26* 20  CREATININE 2.50*  --  2.19* 1.62* 1.16*  CALCIUM 9.4  --  8.4* 8.4* 8.0*  MG  --  2.1  --   --   --   PHOS  --  4.6  --   --   --    Liver Function Tests: Recent Labs  Lab 07/14/19 1509  AST 28  ALT 26  ALKPHOS 132*  BILITOT 1.0  PROT 8.0  ALBUMIN 3.9   No results for input(s): LIPASE, AMYLASE in the last 168 hours. No results for input(s): AMMONIA in the last 168 hours. CBC: Recent Labs  Lab 07/14/19 1509 07/15/19 0433 07/17/19 0506  WBC 9.4 6.1 5.6  NEUTROABS  --   --  4.6  HGB 10.6* 9.5* 9.3*  HCT 34.3* 30.9* 30.3*  MCV 85.3 84.4 84.2  PLT 194 158 151   Cardiac Enzymes: Recent Labs  Lab 07/14/19 1659  CKTOTAL 249*   BNP: BNP (last 3 results) No results for input(s): BNP in the last 8760 hours.  ProBNP (last 3 results) No results for input(s): PROBNP in  the last 8760 hours.  CBG: Recent Labs  Lab 07/17/19 2004 07/17/19 2214 07/18/19 0016 07/18/19 0438 07/18/19 0742  GLUCAP 261* 209* 151* 122* 145*    Active Problems:   Altered mental status   Time coordinating discharge: 38 minutes.  Signed:        Shavy Beachem, DO [facility]  07/18/2019, 12:56 PM

## 2019-07-18 NOTE — Progress Notes (Signed)
[name]  A and O x 4 VSS. Pt tolerating diet well. No complaints of pain or nausea. IV removed intact, prescriptions given. Pt voices understanding of discharge instructions with no further questions. Pt discharged via wheelchair with axillary.   Allergies as of 07/18/2019      Reactions   Aspirin Rash   Codeine Sulfate Rash   Erythromycin Rash   Reaction:  Fever    Prednisone Swelling   Rosiglitazone Maleate Swelling   Tetanus-diphtheria Toxoids Td Rash, Other (See Comments)   Reaction:  Fever       Medication List    STOP taking these medications   metoprolol succinate 25 MG 24 hr tablet Commonly known as: TOPROL-XL     TAKE these medications   albuterol 108 (90 Base) MCG/ACT inhaler Commonly known as: VENTOLIN HFA Inhale 2 puffs into the lungs every 6 (six) hours as needed for wheezing or shortness of breath. Notes to patient: As needed   apixaban 5 MG Tabs tablet Commonly known as: ELIQUIS Take 5 mg by mouth 2 (two) times daily. Notes to patient: Before bed 07/18/19   cephALEXin 500 MG capsule Commonly known as: KEFLEX Take 1 capsule (500 mg total) by mouth 4 (four) times daily for 2 days. Notes to patient: Afternoon 07/18/19   cyclobenzaprine 10 MG tablet Commonly known as: FLEXERIL Take 1 tablet (10 mg total) by mouth 3 (three) times daily as needed for muscle spasms. Must last 30 days. Start taking on: August 10, 2019 Notes to patient: As needed   fluconazole 100 MG tablet Commonly known as: Diflucan Take 1 tablet (100 mg total) by mouth daily for 4 days. Notes to patient: Morning 07/19/19   Insulin Detemir 100 UNIT/ML Pen Commonly known as: LEVEMIR Inject 30 units in the morning and 30 units at bedtime. What changed: additional instructions Notes to patient: Before bed 07/18/19   metoprolol tartrate 25 MG tablet Commonly known as: LOPRESSOR Take 0.5 tablets (12.5 mg total) by mouth 2 (two) times daily. Notes to patient: Before bed 07/18/19    oxyCODONE 5 MG immediate release tablet Commonly known as: Oxy IR/ROXICODONE Take 1 tablet (5 mg total) by mouth every 4 (four) hours as needed for severe pain. Must last 30 days. Max: 6/day Notes to patient: As needed   oxyCODONE 5 MG immediate release tablet Commonly known as: Oxy IR/ROXICODONE Take 1 tablet (5 mg total) by mouth every 4 (four) hours as needed for severe pain. Must last 30 days. Max: 6/day Start taking on: August 12, 2019 Notes to patient: As needed   oxyCODONE 5 MG immediate release tablet Commonly known as: Oxy IR/ROXICODONE Take 1 tablet (5 mg total) by mouth every 4 (four) hours as needed for severe pain. Must last 30 days. Max: 6/day Start taking on: September 11, 2019 Notes to patient: As needed   sertraline 25 MG tablet Commonly known as: ZOLOFT TAKE 1 TABLET AT BEDTIME FOR ANXIETY   sucralfate 1 g tablet Commonly known as: CARAFATE Take 1 tablet (1 g total) by mouth 4 (four) times daily -  with meals and at bedtime. Notes to patient: Before bed 07/18/19            Durable Medical Equipment  (From admission, onward)         Start     Ordered   07/17/19 1156  For home use only DME Bedside commode  Once    Question:  Patient needs a bedside commode to treat with the  following condition  Answer:  Weakness generalized   07/17/19 1156          Vitals:   07/17/19 2119 07/18/19 0442  BP: (!) 155/79 (!) 170/87  Pulse: (!) 103 100  Resp: 18 19  Temp: 98.4 F (36.9 C) 98.5 F (36.9 C)  SpO2: 100% 95%    [name] [name]

## 2019-07-20 ENCOUNTER — Telehealth: Payer: Self-pay

## 2019-07-20 LAB — CULTURE, BLOOD (ROUTINE X 2)
Culture: NO GROWTH
Culture: NO GROWTH
Special Requests: ADEQUATE
Special Requests: ADEQUATE

## 2019-07-20 NOTE — Telephone Encounter (Signed)
Noted, will evaluate. 

## 2019-07-20 NOTE — Telephone Encounter (Signed)
Transition Care Management Follow-up Telephone Call   Date discharged? 07/18/2019   How have you been since you were released from the hospital? Patient states she is in a lot of pain in her neck and back area-has been present for a while, about the same, this is not a new issues and has a pain management provider. Patient states she has been started on antibiotics. Patient has some memory issues but it is not new.   Do you understand why you were in the hospital? No but her husband is aware.   Do you understand the discharge instructions? No but her husband is aware.   Where were you discharged to? Home with her husband.   Items Reviewed:  Medications reviewed: yes  Allergies reviewed: yes  Dietary changes reviewed: yes  Referrals reviewed: Appointment with urology on 07/22/2019. [facility] states [facility] come out and evaluate patient on 07/19/2019.   Functional Questionnaire:   Activities of Daily Living (ADLs):   She states they are independent in the following: none at this time. States they require assistance with the following:  Ambulation-walker, dressing, hygiene, bathing, toileting-was given portal potty, fixing food and fixing medication.   Any transportation issues/concerns?: No   Any patient concerns? Not at this time.   Confirmed importance and date/time of follow-up visits scheduled no  Provider Appointment booked with Alma Friendly, NP on 07/23/2019  Confirmed with patient if condition begins to worsen call PCP or go to the ER.  Patient was given the office number and encouraged to call back with question or concerns.  : yes

## 2019-07-21 ENCOUNTER — Telehealth: Payer: Self-pay

## 2019-07-21 NOTE — Telephone Encounter (Signed)
Approved.  

## 2019-07-21 NOTE — Telephone Encounter (Signed)
Left detail message of the approval for the verbal orders as instructed below.

## 2019-07-21 NOTE — Telephone Encounter (Signed)
Mendel Ryder nurse with [facility] left v/m that pt has been in hospital and now discharged and Mendel Ryder needs verbal orders to resume [facility] nursing care.

## 2019-07-22 ENCOUNTER — Other Ambulatory Visit: Payer: Medicare Other | Admitting: Urology

## 2019-07-22 ENCOUNTER — Telehealth: Payer: Self-pay | Admitting: Urology

## 2019-07-22 NOTE — Telephone Encounter (Signed)
Pt called to reschedule her Stent removal today, pt thought her appt was tomorrow but still wanted to reschedule appt until after December 3rd. Voiced importance of stent removal, offered her [place] locations and other options/appts (overbooking per [place]) in [place]. Pt adament she can't make an appt before Dec 3rd. Offered [place] appt the Tues after the 3rd, pt adament not going to [place] location, pt was scheduled for Dec.14th in [place].   FYI

## 2019-07-23 ENCOUNTER — Ambulatory Visit: Payer: Medicare Other | Admitting: Primary Care

## 2019-07-24 ENCOUNTER — Emergency Department
Admission: EM | Admit: 2019-07-24 | Discharge: 2019-07-24 | Disposition: A | Discharge status: Home / Self Care | Payer: Medicare Other | Attending: Emergency Medicine | Admitting: Emergency Medicine

## 2019-07-24 ENCOUNTER — Other Ambulatory Visit: Payer: Self-pay

## 2019-07-24 ENCOUNTER — Emergency Department: Payer: Medicare Other

## 2019-07-24 DIAGNOSIS — N183 Chronic kidney disease, stage 3 unspecified: Secondary | ICD-10-CM | POA: Diagnosis not present

## 2019-07-24 DIAGNOSIS — E039 Hypothyroidism, unspecified: Secondary | ICD-10-CM | POA: Diagnosis not present

## 2019-07-24 DIAGNOSIS — R071 Chest pain on breathing: Secondary | ICD-10-CM | POA: Insufficient documentation

## 2019-07-24 DIAGNOSIS — Z87891 Personal history of nicotine dependence: Secondary | ICD-10-CM | POA: Diagnosis not present

## 2019-07-24 DIAGNOSIS — E1122 Type 2 diabetes mellitus with diabetic chronic kidney disease: Secondary | ICD-10-CM | POA: Insufficient documentation

## 2019-07-24 DIAGNOSIS — I129 Hypertensive chronic kidney disease with stage 1 through stage 4 chronic kidney disease, or unspecified chronic kidney disease: Secondary | ICD-10-CM | POA: Insufficient documentation

## 2019-07-24 DIAGNOSIS — Z7901 Long term (current) use of anticoagulants: Secondary | ICD-10-CM | POA: Insufficient documentation

## 2019-07-24 DIAGNOSIS — R0602 Shortness of breath: Secondary | ICD-10-CM | POA: Diagnosis not present

## 2019-07-24 DIAGNOSIS — R0789 Other chest pain: Secondary | ICD-10-CM | POA: Diagnosis not present

## 2019-07-24 DIAGNOSIS — Z209 Contact with and (suspected) exposure to unspecified communicable disease: Secondary | ICD-10-CM | POA: Diagnosis not present

## 2019-07-24 DIAGNOSIS — R079 Chest pain, unspecified: Secondary | ICD-10-CM | POA: Diagnosis not present

## 2019-07-24 DIAGNOSIS — Z794 Long term (current) use of insulin: Secondary | ICD-10-CM | POA: Diagnosis not present

## 2019-07-24 DIAGNOSIS — E1165 Type 2 diabetes mellitus with hyperglycemia: Secondary | ICD-10-CM | POA: Diagnosis not present

## 2019-07-24 LAB — CBC WITH DIFFERENTIAL/PLATELET
Abs Immature Granulocytes: 0.02 10*3/uL (ref 0.00–0.07)
Basophils Absolute: 0 10*3/uL (ref 0.0–0.1)
Basophils Relative: 0 %
Eosinophils Absolute: 0.1 10*3/uL (ref 0.0–0.5)
Eosinophils Relative: 1 %
HCT: 31.3 % — ABNORMAL LOW (ref 36.0–46.0)
Hemoglobin: 10 g/dL — ABNORMAL LOW (ref 12.0–15.0)
Immature Granulocytes: 0 %
Lymphocytes Relative: 15 %
Lymphs Abs: 0.8 10*3/uL (ref 0.7–4.0)
MCH: 26.1 pg (ref 26.0–34.0)
MCHC: 31.9 g/dL (ref 30.0–36.0)
MCV: 81.7 fL (ref 80.0–100.0)
Monocytes Absolute: 0.3 10*3/uL (ref 0.1–1.0)
Monocytes Relative: 5 %
Neutro Abs: 4.3 10*3/uL (ref 1.7–7.7)
Neutrophils Relative %: 79 %
Platelets: 170 10*3/uL (ref 150–400)
RBC: 3.83 MIL/uL — ABNORMAL LOW (ref 3.87–5.11)
RDW: 15 % (ref 11.5–15.5)
WBC: 5.5 10*3/uL (ref 4.0–10.5)
nRBC: 0 % (ref 0.0–0.2)

## 2019-07-24 LAB — COMPREHENSIVE METABOLIC PANEL
ALT: 17 U/L (ref 0–44)
AST: 20 U/L (ref 15–41)
Albumin: 3.1 g/dL — ABNORMAL LOW (ref 3.5–5.0)
Alkaline Phosphatase: 114 U/L (ref 38–126)
Anion gap: 14 (ref 5–15)
BUN: 12 mg/dL (ref 8–23)
CO2: 21 mmol/L — ABNORMAL LOW (ref 22–32)
Calcium: 8.7 mg/dL — ABNORMAL LOW (ref 8.9–10.3)
Chloride: 101 mmol/L (ref 98–111)
Creatinine, Ser: 1.34 mg/dL — ABNORMAL HIGH (ref 0.44–1.00)
GFR calc Af Amer: 48 mL/min — ABNORMAL LOW (ref >60)
GFR calc non Af Amer: 41 mL/min — ABNORMAL LOW (ref >60)
Glucose, Bld: 336 mg/dL — ABNORMAL HIGH (ref 70–99)
Potassium: 3.7 mmol/L (ref 3.5–5.1)
Sodium: 136 mmol/L (ref 135–145)
Total Bilirubin: 0.9 mg/dL (ref 0.3–1.2)
Total Protein: 6.9 g/dL (ref 6.5–8.1)

## 2019-07-24 LAB — TROPONIN I (HIGH SENSITIVITY)
Troponin I (High Sensitivity): 7 ng/L (ref <18)
Troponin I (High Sensitivity): 7 ng/L (ref <18)

## 2019-07-24 MED ORDER — MORPHINE SULFATE (PF) 4 MG/ML IV SOLN
4.0000 mg | Freq: Once | INTRAVENOUS | Status: AC
  Administered 2019-07-24: 17:00:00 4 mg via INTRAVENOUS
  Filled 2019-07-24: qty 1

## 2019-07-24 MED ORDER — NITROGLYCERIN 0.3 MG SL SUBL: 0.3000 mg | SUBLINGUAL_TABLET | SUBLINGUAL | 0 refills | Status: DC | PRN

## 2019-07-24 MED ORDER — NITROGLYCERIN 0.4 MG SL SUBL: 0.4000 mg | SUBLINGUAL_TABLET | SUBLINGUAL | Status: DC | PRN

## 2019-07-24 MED ORDER — IOHEXOL 350 MG/ML SOLN
60.0000 mL | Freq: Once | INTRAVENOUS | Status: AC | PRN
  Administered 2019-07-24: 60 mL via INTRAVENOUS

## 2019-07-24 MED ORDER — ONDANSETRON HCL 4 MG/2ML IJ SOLN
4.0000 mg | Freq: Once | INTRAMUSCULAR | Status: AC
  Administered 2019-07-24: 4 mg via INTRAVENOUS
  Filled 2019-07-24: qty 2

## 2019-07-24 MED ORDER — SODIUM CHLORIDE 0.9 % IV BOLUS
1000.0000 mL | Freq: Once | INTRAVENOUS | Status: AC
  Administered 2019-07-24: 1000 mL via INTRAVENOUS

## 2019-07-24 MED ORDER — HYDROCODONE-ACETAMINOPHEN 5-325 MG PO TABS: 1.0000 | ORAL_TABLET | Freq: Four times a day (QID) | ORAL | 0 refills | Status: DC | PRN

## 2019-07-24 NOTE — ED Notes (Signed)
Light green tube sent to lab

## 2019-07-24 NOTE — ED Notes (Signed)
Pt watching tv in NAD, pt expresses no needs at this time

## 2019-07-24 NOTE — ED Notes (Signed)
X-ray at bedside

## 2019-07-24 NOTE — ED Notes (Signed)
Blue, light green and lavender tubes sent to lab

## 2019-07-24 NOTE — Discharge Instructions (Addendum)
Please follow-up with your doctor in the next day or 2.  Please also follow-up with cardiology.  Dr. Percival Spanish is on call.  Their group also has an office here in [place] and that address is with Dr. Donivan Scull contact information.  Please call them Monday morning let them know that she been having chest pain he got better with nitro they should be out of work you in quickly.  Please return for increasing pain fever or shortness of breath.  Use the Vicodin 1 pill 4 times a day as needed for pain.  Please also return for any other problems.  You can use the nitroglycerin 1 under the tongue as needed for pain.  Do not use more than 1 every 5 minutes and I would sit down when you start taking them.  He will get a headache.  After you have taken 3 call EMS.

## 2019-07-24 NOTE — ED Notes (Signed)
Pt ambulated to toilet with assitance, gait slow but steady

## 2019-07-24 NOTE — ED Provider Notes (Signed)
[facility] Emergency Department Provider Note   ____________________________________________   First MD Initiated Contact with Patient 07/24/19 1615     (approximate)  I have reviewed the triage vital signs and the nursing notes.   HISTORY  Chief Complaint Chest Pain   HPI [name] is a 63 y.o. female patient with for 5 days of chest pain got worse last night.  The pain is radiating from the middle of the chest out to the left.  Is worse with deep breathing and movement.  She feels short of breath and her heart is pounding.  She is not having a fever.  He is not really coughing.  Pain is sharp and stabbing when it is bad.       Past Medical History:  Diagnosis Date  . Acute encephalopathy 05/22/2018  . Allergy   . Anxiety   . Ascites   . C. difficile colitis 07/10/2015  . Cancer (HCC)    HX OF CANCER OF UTERUS   . Cirrhosis of liver not due to alcohol ([place]) 2016  . Degenerative disk disease   . Diverticulitis   . Gastroparesis   . GERD (gastroesophageal reflux disease)   . History of hiatal hernia   . Hypertension   . Hypothyroid   . Hypothyroidism due to amiodarone   . Ileus ([place]) 08/01/2015  . Intussusception intestine ([place]) 05/2015  . Orthostatic hypotension   . PAF (paroxysmal atrial fibrillation) ([place]) 03/2015   a. new onset 03/2015 in setting of intractable N/V; b. on Eliquis 5 mg bid; c. CHADSVASc 4 (DM, TIA x 2, female)  . Pancreatitis   . Pneumonia 11/14/2015  . Right ureteral stone 07/14/2016  . Sepsis ([place]) 12/15/2017  . Sick sinus syndrome ([place])   . Stomach ulcer   . Stroke [facility])    with minimal left sided weakness  . Syncope 01/2015  . Syncope due to orthostatic hypotension 05/18/2015  . Tachyarrhythmia 01/10/2016  . TIA (transient ischemic attack) 02/2015  . Type 1 diabetes ([place])    on levemir  . UTERINE CANCER, HX OF 03/27/2007   Qualifier: Diagnosis of  By: Maxie Better FNP, Rosalita Levan   . UTI (urinary tract infection)  05/22/2018    Patient Active Problem List   Diagnosis Date Noted  . Altered mental status 07/14/2019  . GAD (generalized anxiety disorder) 06/11/2019  . Poor memory 06/11/2019  . Constipation 05/25/2019  . Acute medial meniscus tear of left knee 05/25/2019  . AKI (acute kidney injury) ([place]) 05/16/2019  . Closed fracture of left distal fibula 05/16/2019  . Sinus tachycardia 05/05/2019  . Congestive dilated cardiomyopathy ([place]) 04/14/2019  . CAD (coronary artery disease) 04/10/2019  . Closed fracture of lateral malleolus 04/10/2019  . Lumbar facet syndrome (Bilateral) (R>L) 03/09/2019  . Long term (current) use of anticoagulants 03/09/2019  . Unspecified cirrhosis of liver ([place]) 02/11/2019  . Hypoalbuminemia 02/11/2019  . Edema due to hypoalbuminemia 02/11/2019  . Lower extremity edema 01/26/2019  . Prolonged QT interval 12/17/2018  . Hypomagnesemia 12/17/2018  . Uncontrolled type 2 diabetes mellitus ([place]) 12/17/2018  . H/O TIA (transient ischemic attack) and stroke 12/17/2018  . Personal history of surgery to heart and great vessels, presenting hazards to health 12/17/2018  . DDD (degenerative disc disease), lumbar 12/17/2018  . Lumbar intervertebral disc displacement 12/17/2018  . Chronic musculoskeletal pain 12/17/2018  . Chronic generalized pain 12/17/2018  . Hyponatremia 12/17/2018  . Fibromyalgia syndrome 12/17/2018  . Other intervertebral disc displacement, lumbar  region 12/17/2018  . Vitamin D deficiency 11/26/2018  . Elevated C-reactive protein (CRP) 11/26/2018  . Elevated sed rate 11/26/2018  . Chronic low back pain (Primary area of Pain) (Bilateral) (L>R) w/ sciatica (Bilateral) 11/25/2018  . Chronic lower extremity pain (Secondary Area of Pain) (Bilateral) (L>R) 11/25/2018  . Chronic neck pain 11/25/2018  . Pharmacologic therapy 11/25/2018  . Disorder of skeletal system 11/25/2018  . Problems influencing health status 11/25/2018  . Renal mass 10/06/2018  . Chronic  intermittent abdominal pain 09/25/2018  . Unspecified abdominal pain 09/25/2018  . Recurrent falls 09/12/2018  . Orthostatic hypotension 08/18/2018  . Normochromic normocytic anemia 08/18/2018  . CKD (chronic kidney disease), stage III 08/18/2018  . History of hypothyroidism 08/14/2018  . Medical non-compliance 08/14/2018  . Diabetic ketoacidosis ([place]) 05/22/2018  . UTI (urinary tract infection) 05/22/2018  . NSTEMI (non-ST elevated myocardial infarction) ([place]) 01/11/2016  . Tachy-brady syndrome ([place]) 12/28/2015  . PAF (paroxysmal atrial fibrillation) ([place]) 11/24/2015  . Type 2 diabetes mellitus with neurologic complication ([place]) 38/18/2993  . S/P cholecystectomy 11/23/2015  . Narcotic abuse ([place]) 11/22/2015  . Chronic superficial gastritis without bleeding 11/22/2015  . History of Clostridium difficile 11/22/2015  . History of TIA (transient ischemic attack) 11/22/2015  . Mixed hyperlipidemia 11/22/2015  . Diverticulosis 11/22/2015  . History of uterine cancer 11/22/2015  . Hypothyroidism, unspecified 11/22/2015  . Intractable cyclical vomiting with nausea 11/22/2015  . Narcotic withdrawal ([place]) 11/11/2015  . Insulin dependent type 2 diabetes mellitus ([place]) 08/31/2015  . Hyperlipidemia 08/31/2015  . Cryptogenic cirrhosis ([place]) 07/10/2015  . Atrial fibrillation ([place]) 07/10/2015  . Malnutrition of moderate degree 07/04/2015  . Symptomatic bradycardia 05/27/2015  . Chronic anemia 05/19/2015  . Thrombocytopenia ([place]) 05/19/2015  . Hypokalemia 04/06/2015  . Hyperlipidemia with target LDL less than 100 04/06/2015  . Syncope 03/11/2015  . CVA (cerebral vascular accident) ([place]) 02/15/2015  . Essential hypertension 01/12/2015  . Chronically on opiate therapy 01/12/2015  . S/P Nissen fundoplication (without gastrostomy tube) procedure 05/11/2014  . Dysphagia 04/19/2014  . DEPRESSION/ANXIETY 06/27/2007  . Myofascial pain syndrome 06/27/2007  . Chronic pain syndrome 03/28/2007  .  GERD 03/27/2007  . Diverticulosis of large intestine without perforation or abscess without bleeding 03/27/2007  . Proteinuria 03/27/2007    Past Surgical History:  Procedure Laterality Date  . ABDOMINAL HYSTERECTOMY    . CARDIAC CATHETERIZATION N/A 01/12/2016   Procedure: Left Heart Cath and Coronary Angiography;  Surgeon: Wellington Hampshire, MD;  Location: [facility];  Service: Cardiovascular;  Laterality: N/A;  . CHOLECYSTECTOMY    . CYSTOSCOPY/URETEROSCOPY/HOLMIUM LASER Right 07/14/2016   Procedure: CYSTOSCOPY/URETEROSCOPY/HOLMIUM LASER;  Surgeon: Alexis Frock, MD;  Location: [facility] ORS;  Service: Urology;  Laterality: Right;  . ESOPHAGOGASTRODUODENOSCOPY N/A 04/04/2015   Procedure: ESOPHAGOGASTRODUODENOSCOPY (EGD);  Surgeon: Hulen Luster, MD;  Location: [facility] ENDOSCOPY;  Service: Endoscopy;  Laterality: N/A;  . ESOPHAGOGASTRODUODENOSCOPY N/A 12/28/2017   Procedure: ESOPHAGOGASTRODUODENOSCOPY (EGD);  Surgeon: Lin Landsman, MD;  Location[facility] ENDOSCOPY;  Service: Gastroenterology;  Laterality: N/A;  . ESOPHAGOGASTRODUODENOSCOPY (EGD) WITH PROPOFOL N/A 01/18/2016   Procedure: ESOPHAGOGASTRODUODENOSCOPY (EGD) WITH PROPOFOL;  Surgeon: Lucilla Lame, MD;  Location: [facility] ENDOSCOPY;  Service: Endoscopy;  Laterality: N/A;  . FLEXIBLE SIGMOIDOSCOPY N/A 01/18/2016   Procedure: FLEXIBLE SIGMOIDOSCOPY;  Surgeon: Lucilla Lame, MD;  Location: [facility] ENDOSCOPY;  Service: Endoscopy;  Laterality: N/A;  . HERNIA REPAIR      Prior to Admission medications   Medication Sig Start Date End Date Taking? Authorizing Provider  albuterol (PROVENTIL  HFA;VENTOLIN HFA) 108 (90 Base) MCG/ACT inhaler Inhale 2 puffs into the lungs every 6 (six) hours as needed for wheezing or shortness of breath. 11/27/18   Epifanio Lesches, MD  apixaban (ELIQUIS) 5 MG TABS tablet Take 5 mg by mouth 2 (two) times daily.    [provider]  cyclobenzaprine (FLEXERIL) 10 MG tablet Take 1 tablet (10 mg total) by mouth 3  (three) times daily as needed for muscle spasms. Must last 30 days. 08/10/19 02/06/20  Milinda Pointer, MD  HYDROcodone-acetaminophen (NORCO/VICODIN) 5-325 MG tablet Take 1 tablet by mouth every 6 (six) hours as needed for moderate pain. 07/24/19   Nena Polio, MD  Insulin Detemir (LEVEMIR) 100 UNIT/ML Pen Inject 30 units in the morning and 30 units at bedtime. Patient taking differently: Inject 25 units in the morning and 30 units at bedtime. 02/09/19   [name], NP  metoprolol tartrate (LOPRESSOR) 25 MG tablet Take 0.5 tablets (12.5 mg total) by mouth 2 (two) times daily. 07/18/19   Swayze, Ava, DO  oxyCODONE (OXY IR/ROXICODONE) 5 MG immediate release tablet Take 1 tablet (5 mg total) by mouth every 4 (four) hours as needed for severe pain. Must last 30 days. Max: 6/day 07/13/19 08/12/19  Milinda Pointer, MD  oxyCODONE (OXY IR/ROXICODONE) 5 MG immediate release tablet Take 1 tablet (5 mg total) by mouth every 4 (four) hours as needed for severe pain. Must last 30 days. Max: 6/day 08/12/19 09/11/19  Milinda Pointer, MD  oxyCODONE (OXY IR/ROXICODONE) 5 MG immediate release tablet Take 1 tablet (5 mg total) by mouth every 4 (four) hours as needed for severe pain. Must last 30 days. Max: 6/day 09/11/19 10/11/19  Milinda Pointer, MD  sertraline (ZOLOFT) 25 MG tablet TAKE 1 TABLET AT BEDTIME FOR ANXIETY 07/09/19   [name], NP  sucralfate (CARAFATE) 1 g tablet Take 1 tablet (1 g total) by mouth 4 (four) times daily -  with meals and at bedtime. 07/18/19   Swayze, Ava, DO    Allergies Aspirin, Codeine sulfate, Erythromycin, Prednisone, Rosiglitazone maleate, and Tetanus-diphtheria toxoids td  Family History  Problem Relation Age of Onset  . Hypertension Mother   . CAD Sister   . Heart attack Sister        Deceased 11/05/14  . CAD Brother     Social History Social History   Tobacco Use  . Smoking status: Former Smoker    Types: Cigarettes  . Smokeless tobacco: Never Used   . Tobacco comment: 25 years ago and only smoked occasionally  Substance Use Topics  . Alcohol use: No  . Drug use: No    Review of Systems  Constitutional: No fever/chills Eyes: No visual changes. ENT: No sore throat. Cardiovascular:  chest pain. Respiratory: shortness of breath. Gastrointestinal: No abdominal pain.  No nausea, no vomiting.  No diarrhea.  No constipation. Genitourinary: Negative for dysuria. Musculoskeletal: Negative for back pain. Skin: Negative for rash. Neurological: Negative for headaches, focal weakness  ____________________________________________   PHYSICAL EXAM:  VITAL SIGNS: ED Triage Vitals  Enc Vitals Group     BP 07/24/19 1623 93/68     Pulse Rate 07/24/19 1623 (!) 122     Resp 07/24/19 1623 14     Temp 07/24/19 1623 98.5 F (36.9 C)     Temp Source 07/24/19 1623 Oral     SpO2 07/24/19 1623 97 %     Weight 07/24/19 1617 135 lb (61.2 kg)     Height 07/24/19 1617 5'  3" (1.6 m)     Head Circumference --      Peak Flow --      Pain Score 07/24/19 1617 10     Pain Loc --      Pain Edu? --      Excl. in [place]? --     Constitutional: Alert and oriented. Ill appearing and in acute distress. Eyes: Conjunctivae are normal. PERRL. EOMI. Head: Atraumatic. Nose: No congestion/rhinnorhea. Mouth/Throat: Mucous membranes are moist.  Oropharynx non-erythematous. Neck: No stridor.  Cardiovascular: Rapid rate, regular rhythm. Grossly normal heart sounds.  Good peripheral circulation. Respiratory: Increased respiratory effort.  No retractions. Lungs CTAB. Gastrointestinal: Soft and nontender. No distention. No abdominal bruits. No CVA tenderness. Musculoskeletal: No pain in the legs no edema Neurologic:  Normal speech and language. No gross focal neurologic deficits are appreciated.  Skin:  Skin is warm, dry and intact. No rash noted.   ____________________________________________   LABS (all labs ordered are listed, but only abnormal results are  displayed)  Labs Reviewed  COMPREHENSIVE METABOLIC PANEL - Abnormal; Notable for the following components:      Result Value   CO2 21 (*)    Glucose, Bld 336 (*)    Creatinine, Ser 1.34 (*)    Calcium 8.7 (*)    Albumin 3.1 (*)    GFR calc non Af Amer 41 (*)    GFR calc Af Amer 48 (*)    All other components within normal limits  CBC WITH DIFFERENTIAL/PLATELET - Abnormal; Notable for the following components:   RBC 3.83 (*)    Hemoglobin 10.0 (*)    HCT 31.3 (*)    All other components within normal limits  TROPONIN I (HIGH SENSITIVITY)  TROPONIN I (HIGH SENSITIVITY)   ____________________________________________  EKG  EKG read interpreted by me shows sinus tachycardia rate of 124 normal axis no acute ST-T wave changes except for small amount of ST segment depression in lead I.  Some decreased R wave progression.  There is a small less than 1 and a Q-wave and inverted T in lead III.  ___________________________________  RADIOLOGY  ED MD interpretation: Chest x-ray reviewed by me I do not see anything going on particularly. Angiogram does not show any acute findings either. Official radiology report(s): Ct Angio Chest Pe W And/or Wo Contrast  Result Date: 07/24/2019 CLINICAL DATA:  Shortness of breath with pleuritic chest pain and tachycardia. EXAM: CT ANGIOGRAPHY CHEST WITH CONTRAST TECHNIQUE: Multidetector CT imaging of the chest was performed using the standard protocol during bolus administration of intravenous contrast. Multiplanar CT image reconstructions and MIPs were obtained to evaluate the vascular anatomy. CONTRAST:  44m OMNIPAQUE IOHEXOL 350 MG/ML SOLN COMPARISON:  12/17/2015 FINDINGS: Cardiovascular: Contrast injection is sufficient to demonstrate satisfactory opacification of the pulmonary arteries to the segmental level. There is no pulmonary embolus. The main pulmonary artery is within normal limits for size. There is no CT evidence of acute right heart strain. The  visualized aorta is normal. Heart size is mildly enlarged. There is no significant pericardial effusion. Mediastinum/Nodes: --No mediastinal or hilar lymphadenopathy. --No axillary lymphadenopathy. --No supraclavicular lymphadenopathy. --Normal thyroid gland. --The esophagus is unremarkable Lungs/Pleura: No pulmonary nodules or masses. No pleural effusion or pneumothorax. No focal airspace consolidation. No focal pleural abnormality. There is a small amount of debris in the upper trachea. Upper Abdomen: The liver is cirrhotic. The spleen is enlarged. Musculoskeletal: No chest wall abnormality. No acute or significant osseous findings. Review of the MIP images confirms  the above findings. IMPRESSION: 1. No evidence of pulmonary embolus. 2. Cirrhosis and splenomegaly. Aortic Atherosclerosis (ICD10-I70.0). Electronically Signed   By: Constance Holster M.D.   On: 07/24/2019 17:53   Dg Chest Portable 1 View  Result Date: 07/24/2019 CLINICAL DATA:  Chest pain, hypertension EXAM: PORTABLE CHEST 1 VIEW COMPARISON:  07/17/2019 FINDINGS: The heart size and mediastinal contours are stable. Calcific aortic knob. Low lung volumes. No focal airspace consolidation, pleural effusion, or pneumothorax. IMPRESSION: Low lung volumes.  No acute cardiopulmonary findings. Electronically Signed   By: Davina Poke M.D.   On: 07/24/2019 16:45    ____________________________________________   PROCEDURES  Procedure(s) performed (including Critical Care):  Procedures   ____________________________________________   INITIAL IMPRESSION / ASSESSMENT AND PLAN / ED COURSE  Computer does not want me to give Zofran because it says patient has QT prolongation.  The current EKG does not show any QT prolongation.  I will give her Zofran.       Patient is troponins negative chest x-ray is negative EKG actually looks similar to prior EKGs CT is negative it is possible she is having some kind of mononeuritis due to her  diabetes or alternatively pleurisy. I am not sure that there is anything else we can do besides some pain medicine we will give her that and let her go home and follow-up with her doctor.       ____________________________________________   FINAL CLINICAL IMPRESSION(S) / ED DIAGNOSES  Final diagnoses:  Chest pain on breathing     ED Discharge Orders         Ordered    HYDROcodone-acetaminophen (NORCO/VICODIN) 5-325 MG tablet  Every 6 hours PRN     07/24/19 2002           Note:  This document was prepared using Dragon voice recognition software and may include unintentional dictation errors.    Nena Polio, MD 07/24/19 2013

## 2019-07-24 NOTE — ED Notes (Signed)
Pt otf for imaging 

## 2019-07-24 NOTE — ED Triage Notes (Addendum)
Pt arrives via [facility] from home for CP x 4-5 days with worsening symptoms today. Reports pain worse when laying down, but reports pain constant over the last 24 hours. Pt A&Ox4,  Reports pain 10/10 in central chest radiating to left side. Pt has a hx of A-fiib. Pt received 1 nitro in route and EMS reports this brought her BP down to 80/40.

## 2019-07-28 ENCOUNTER — Telehealth: Payer: Self-pay | Admitting: Primary Care

## 2019-07-28 ENCOUNTER — Telehealth: Payer: Self-pay | Admitting: Cardiovascular Disease

## 2019-07-28 NOTE — Telephone Encounter (Signed)
Received a call from Reche Dixon from [facility]. Patient was receiving [facility] RN and PT from a hospital visit back in Sept and it has run out. The patient is calling them wanting them to come back out but they will need a New HH Referral for these services Patient is coming in this Friday 07/31/19 for a hospital FU with you. RN was sent to monitor patients heart and her medications and PT was for therapy. Please advise if you want to wait till Friday when patient has a face to face or call them and order now.

## 2019-07-28 NOTE — Telephone Encounter (Signed)
Called patient. She did report the elevated BP reading and HR to nurse (175/110, HR 120) earlier. Since then she has taken 1 Nitroglycerin. Denies chest pain, shortness of breath, dizziness or swelling. Her complaint is of when she lays down it feels like her heart is beating out of her chest. Was in the ED on 07/24/19 (last Friday) for elevated HR and chest pain.  Patient took BP/HR while on the phone with me - 161/82, 114. No appointments today but able to schedule patient to come in tomorrow morning. Patient aware of how to enter [facility] at the [place]. Pt verbalized understanding to call 911 or go to the emergency room, if he develops any new or worsening symptoms. Routing to [place] to make them aware and any further advice at this time.

## 2019-07-28 NOTE — Progress Notes (Deleted)
Cardiology Office Note    Date:  07/28/2019   ID:  Rita BEHAN, DOB [birth year], MRN [id number]  PCP:  [name], NP  Cardiologist:  [name], MD  Electrophysiologist:  None   Chief Complaint: ***  History of Present Illness:   [name] is a 63 y.o. female with history of nonobstructive CAD by [name] in 01/2016, stress-induced cardiomyopathy, PAF on Eliquis, tachybradycardia syndrome, poorly controlled diabetes with gastroparesis and prior DKA, syncope, alcohol use, alcoholic cirrhosis with ascites status post paracentesis in 09/2018, hypothyroidism, TIA/CVA with residual left-sided weakness, SVT, GI bleed in 12/2017, HTN, COPD, perinephric mass status post retained right ureteral stent with chronic/frequent UTIs, C. difficile colitis, anemia, noncompliance, chronic pain syndrome, uterine cancer, anxiety, and GERD who presents for ***  Patient was admitted to [facility] in 09/2015 with dizziness, presyncope, and bradycardia.  At that time, he was taken metoprolol 25 mg twice daily and amiodarone 20 mg daily.  Echo showed an EF of 60 to 65%, normal wall motion, grade 1 diastolic dysfunction.  Carotid Dopplers showed bilateral 1 to 39% stenoses.  She was seen by EP given her tachybradycardia syndrome and felt to not be a candidate for pacemaker implantation.  She was readmitted to the hospital in 12/2015 with nausea, vomiting, and diarrhea with associated syncope.  Cardiology was consulted for reported bradycardia though there were no bradycardic episodes for review on telemetry.  Echo was repeated and showed an EF of 60 to 65%.  She was admitted again in 12/2015 with chest pain and troponin peaking at 0.49.  Diagnostic cath showed nonobstructive CAD with LV gram showing an EF of 40% with wall motion abnormalities suggestive of stress-induced cardiomyopathy.  She was admitted in 12/2017 for hyperglycemia and generalized weakness and incidentally noted to be hemoccult positive with a GI  bleed with endoscopy showing an esophageal tear status post clipping.  She continues to note generalized weakness and underwent CT head and MRI brain which were unrevealing for new acute intracranial abnormalities.  Echo showed an EF of 55 to 60%, unable to exclude regional wall motion abnormalities, mild to moderate mitral regurgitation, mildly dilated left atrium, normal RV systolic function and PASP.  Carotid artery ultrasound showed no evidence of significant atherosclerotic plaque or stenosis in the bilateral internal carotid arteries with antegrade flow of the bilateral vertebral arteries.  She was most recently seen virtually by Dr. Rockey Situ on 06/10/2019 noting to be "losing memory."  She was taking midodrine 3 times daily for orthostasis that was unsure if this was helping.  She continued to note tachycardic heart rates which were a longstanding issue.  It was recommended she continue midodrine 10 mg 3 times daily and push fluids as well as to work on her weight as her appetite was poor.  It was noted it was difficult to differentiate falls from leg weakness versus syncope.  Given her persistent tachycardic rates it was recommended she increase Toprol XL to 25 mg twice daily.  She was admitted to the hospital in early 07/2019 with altered mental status, failure to thrive, AKI status post electrolyte repletion and IV fluids.  Blood cultures negative.  Her ureteral stent was again noted on imaging with recommended outpatient follow-up with urology.  High-sensitivity troponin of 21 with a delta of 20. EKG again showed sinus tachycardia with ventricular rates in the 120s.  She was seen in the ED on 07/24/2019 with 5-day history of chest pain that was worse with deep inspiration  and movement.  There were associated palpitations.  High-sensitivity troponin negative x2.  Hemoglobin low though stable at 10.  Glucose poorly controlled at 336, potassium 3.7, BUN 12, serum creatinine 1.34, albumin 3.1, AST/ALT  normal, chest x-ray with low lung volumes with no acute cardiopulmonary findings.  CTA chest showed no evidence of PE with cirrhosis and splenomegaly as well as aortic atherosclerosis.  EKG again showed sinus tachycardia with rates in the 120s bpm.  Etiology of symptoms was unclear.  Patient was given pain medicine and discharged home.  We received phone call from patient on 07/28/2019 with BP 175/110 with heart rate 120 bpm.  Patient took 1 nitroglycerin.  She denied any chest pain, dizziness, or swelling.  She noted palpitations when she was lying down.  In the setting, appointment was made for today.  ***  Past Medical History:  Diagnosis Date   Acute encephalopathy 05/22/2018   Allergy    Anxiety    Ascites    C. difficile colitis 07/10/2015   Cancer ([place])    HX OF CANCER OF UTERUS    Cirrhosis of liver not due to alcohol ([place]) 2016   Degenerative disk disease    Diverticulitis    Gastroparesis    GERD (gastroesophageal reflux disease)    History of hiatal hernia    Hypertension    Hypothyroid    Hypothyroidism due to amiodarone    Ileus ([name]) 08/01/2015   Intussusception intestine ([name]) 05/2015   Orthostatic hypotension    PAF (paroxysmal atrial fibrillation) ([place]) 03/2015   a. new onset 03/2015 in setting of intractable N/V; b. on Eliquis 5 mg bid; c. CHADSVASc 4 (DM, TIA x 2, female)   Pancreatitis    Pneumonia 11/14/2015   Right ureteral stone 07/14/2016   Sepsis ([name]) 12/15/2017   Sick sinus syndrome ([name])    Stomach ulcer    Stroke ([name])    with minimal left sided weakness   Syncope 01/2015   Syncope due to orthostatic hypotension 05/18/2015   Tachyarrhythmia 01/10/2016   TIA (transient ischemic attack) 02/2015   Type 1 diabetes ([name])    on levemir   UTERINE CANCER, HX OF 03/27/2007   Qualifier: Diagnosis of  By: Maxie Better FNP, Rosalita Levan    UTI (urinary tract infection) 05/22/2018    Past Surgical History:  Procedure Laterality Date     ABDOMINAL HYSTERECTOMY     CARDIAC CATHETERIZATION N/A 01/12/2016   Procedure: Left Heart Cath and Coronary Angiography;  Surgeon: Wellington Hampshire, MD;  Location: [facility];  Service: Cardiovascular;  Laterality: N/A;   CHOLECYSTECTOMY     CYSTOSCOPY/URETEROSCOPY/HOLMIUM LASER Right 07/14/2016   Procedure: CYSTOSCOPY/URETEROSCOPY/HOLMIUM LASER;  Surgeon: Alexis Frock, MD;  Location: [facility];  Service: Urology;  Laterality: Right;   ESOPHAGOGASTRODUODENOSCOPY N/A 04/04/2015   Procedure: ESOPHAGOGASTRODUODENOSCOPY (EGD);  Surgeon: Hulen Luster, MD;  Location: [facility] ENDOSCOPY;  Service: Endoscopy;  Laterality: N/A;   ESOPHAGOGASTRODUODENOSCOPY N/A 12/28/2017   Procedure: ESOPHAGOGASTRODUODENOSCOPY (EGD);  Surgeon: Lin Landsman, MD;  Location: [facility] ENDOSCOPY;  Service: Gastroenterology;  Laterality: N/A;   ESOPHAGOGASTRODUODENOSCOPY (EGD) WITH PROPOFOL N/A 01/18/2016   Procedure: ESOPHAGOGASTRODUODENOSCOPY (EGD) WITH PROPOFOL;  Surgeon: Lucilla Lame, MD;  Location: [facility] ENDOSCOPY;  Service: Endoscopy;  Laterality: N/A;   FLEXIBLE SIGMOIDOSCOPY N/A 01/18/2016   Procedure: FLEXIBLE SIGMOIDOSCOPY;  Surgeon: Lucilla Lame, MD;  Location: [facility] ENDOSCOPY;  Service: Endoscopy;  Laterality: N/A;   HERNIA REPAIR      Current Medications: No outpatient medications have been marked as  taking for the 07/29/19 encounter (Appointment) with Rise Mu, PA-C.    Allergies:   Aspirin, Codeine sulfate, Erythromycin, Prednisone, Rosiglitazone maleate, and Tetanus-diphtheria toxoids td   Social History   Socioeconomic History   Marital status: Married    Spouse name: Deshunda Thackston    Number of children: Not on file   Years of education: Not on file   Highest education level: Not on file  Occupational History   Occupation: Disabled 2nd back problems  Social Needs   Financial resource strain: Not hard at all   Food insecurity    Worry: Never true    Inability: Never true    Transportation needs    Medical: No    Non-medical: No  Tobacco Use   Smoking status: Former Smoker    Types: Cigarettes   Smokeless tobacco: Never Used   Tobacco comment: 25 years ago and only smoked occasionally  Substance and Sexual Activity   Alcohol use: No   Drug use: No   Sexual activity: Not Currently  Lifestyle   Physical activity    Days per week: 0 days    Minutes per session: 0 min   Stress: Not at all  Relationships   Social connections    Talks on phone: Patient refused    Gets together: Patient refused    Attends religious service: Patient refused    Active member of club or organization: Patient refused    Attends meetings of clubs or organizations: Patient refused    Relationship status: Patient refused  Other Topics Concern   Not on file  Social History Narrative   Lives in [place], [place] with her husband and 2 sons.     Family History:  The patient's family history includes CAD in her brother and sister; Heart attack in her sister; Hypertension in her mother.  ROS:   ROS   EKGs/Labs/Other Studies Reviewed:    Studies reviewed were summarized above. The additional studies were reviewed today: As above.   EKG:  EKG is ordered today.  The EKG ordered today demonstrates ***  Recent Labs: 05/16/2019: TSH 3.399 07/14/2019: Magnesium 2.1 07/24/2019: ALT 17; BUN 12; Creatinine, Ser 1.34; Hemoglobin 10.0; Platelets 170; Potassium 3.7; Sodium 136  Recent Lipid Panel    Component Value Date/Time   CHOL 104 09/12/2018 1005   CHOL 182 11/12/2011 0325   TRIG 90.0 09/12/2018 1005   TRIG 149 11/12/2011 0325   HDL 43.40 09/12/2018 1005   HDL 23 (L) 11/12/2011 0325   CHOLHDL 2 09/12/2018 1005   VLDL 18.0 09/12/2018 1005   VLDL 30 11/12/2011 0325   LDLCALC 42 09/12/2018 1005   LDLCALC 129 (H) 11/12/2011 0325    PHYSICAL EXAM:    VS:  There were no vitals taken for this visit.  BMI: There is no height or weight on file to calculate  BMI.  Physical Exam  Wt Readings from Last 3 Encounters:  07/24/19 135 lb (61.2 kg)  07/14/19 153 lb (69.4 kg)  06/11/19 153 lb 12 oz (69.7 kg)     ASSESSMENT & PLAN:   1. ***  Disposition: F/u with Dr. Rockey Situ or an APP in ***.   Medication Adjustments/Labs and Tests Ordered: Current medicines are reviewed at length with the patient today.  Concerns regarding medicines are outlined above. Medication changes, Labs and Tests ordered today are summarized above and listed in the Patient Instructions accessible in Encounters.   SignedChristell Faith, PA-C 07/28/2019 12:39 PM  [address] [place], [place] [address] [phone]

## 2019-07-28 NOTE — Telephone Encounter (Signed)
Will discuss with patient during upcoming office visit. She likely needs to resume all [facility] services.

## 2019-07-28 NOTE — Telephone Encounter (Signed)
This may have been sent to me in error. I have not seen her since 2017. Would be nice to know what her rhythm is, though she was sinus tachy in the ED on 11/13 with negative CTA chest. Patient to be assessed 11/18 per RN.

## 2019-07-28 NOTE — Telephone Encounter (Signed)
Levada Dy a nurse with [facility] with patient today Patient went to the ED Friday with HR in 130s - was given nitroglycerin and sent home Today patient's BP is 175/110 - HR 120 and patient is tired Would like to know what Dr Rockey Situ would suggest - should patient go back to ED? Please call patient to disucss

## 2019-07-29 ENCOUNTER — Ambulatory Visit (INDEPENDENT_AMBULATORY_CARE_PROVIDER_SITE_OTHER): Payer: Medicare Other | Admitting: Cardiovascular Disease

## 2019-07-29 ENCOUNTER — Ambulatory Visit: Payer: Medicare Other | Admitting: Physician Assistant

## 2019-07-29 ENCOUNTER — Other Ambulatory Visit: Payer: Self-pay

## 2019-07-29 ENCOUNTER — Encounter: Payer: Self-pay | Admitting: Cardiovascular Disease

## 2019-07-29 VITALS — BP 120/80 | HR 118 | Ht 63.0 in | Wt 150.8 lb

## 2019-07-29 DIAGNOSIS — I951 Orthostatic hypotension: Secondary | ICD-10-CM | POA: Diagnosis not present

## 2019-07-29 DIAGNOSIS — E785 Hyperlipidemia, unspecified: Secondary | ICD-10-CM | POA: Diagnosis not present

## 2019-07-29 DIAGNOSIS — R000 Tachycardia, unspecified: Secondary | ICD-10-CM

## 2019-07-29 DIAGNOSIS — I480 Paroxysmal atrial fibrillation: Secondary | ICD-10-CM | POA: Diagnosis not present

## 2019-07-29 MED ORDER — MIDODRINE HCL 5 MG PO TABS: 5.0000 mg | ORAL_TABLET | Freq: Three times a day (TID) | ORAL | 3 refills | Status: DC

## 2019-07-29 MED ORDER — METOPROLOL TARTRATE 25 MG PO TABS: 25.0000 mg | ORAL_TABLET | Freq: Two times a day (BID) | ORAL | 3 refills | Status: DC

## 2019-07-29 NOTE — Telephone Encounter (Signed)
Scheduled to be seen in clinic today Seen in emergency room felt to be musculoskeletal symptoms She has chronic tachycardia We have been increasing her metoprolol

## 2019-07-29 NOTE — Progress Notes (Addendum)
Evaluation Performed:  Follow-up visit  Date:  07/29/2019   ID:  [name], DOB 02-Jul-1956, MRN [id number]  Patient Location:  [place] [address]   Provider location:    [facility], [place] office  PCP:  [name], NP  Cardiologist:  [facility]  Chief Complaint  Patient presents with  . other    Follow up from [facility] tachycardia. Meds reviewed by the pt. verbally. Pt. c/o chest pain, fatigue, rapid heart beats, shortness of breath and bilateral leg pain when walking. Pt. has fallen 6 times in the past week due to leg pain.     History of Present Illness:    [name] is a 63 y.o. female past medical history of Diabetes type 2 poorly controlled Alcohol use, liver cirrhosis Uterine cancer Chronic pain Gastroparesis, chronic nausea, anorexia Paroxysmal atrial fibrillation on anticoagulation Type 1 diabetes Prior cardiac catheterization May 2017 no significant coronary disease Ejection fraction 55 to 60% April 2019 Paracentesis starting January 2020 Episodes of syncope from orthostatic hypotension, better on midodrine Presenting  for history of syncope, sinus tachycardia,  paroxysmal atrial fibrillation  Seen in the emergency room July 24, 2019 Records reviewed Reported having chest pain for 5 days radiating to the left Worse with breathing and movement Felt short of breath, tachycardic Described the pain as sharp, stabbing Heart rate 122, hematocrit 31 CT scan chest no PE Cardiac enzymes negative Felt to be musculoskeletal pain, was given pain medication Hurt to push on it, followed by the pain clinic  In the hospital July 14, 2019, mental status changes, became weak, multiple falls, confusion, decreased appetite Possible urinary infection Midodrine was stopped, metoprolol dose changed from metoprolol succinate 25 twice daily down to metoprolol tartrate 12.5 twice daily  History of  chronic leg weakness , working with PT Prior history of syncope, resolved with midodrine in the past Has a caretaker,   Atrial fibrillation Seen on EKG April 05, 2015. Numerous EKGs reviewed since that time, no further documentation of atrial fibrillation  Poor diet, sometimes eating 1 meal a day  Today, static pressures 142/85, rate 118 sitting Standing: 99/68, rate 126, very weak standing 108/67, rate 131  Bothered by tachycardia, heart pounding (only taking low dose metoprolol after discharge from the hospital)  EKG personally reviewed by myself on todays visit Shows sinus tachycardia rate 118 bpm consider old inferior MI  Previous records reviewed "03/2015 in setting of intractable N/V; b. on Eliquis 5 mg bid; c. CHADSVASc 4 (DM, TIA x 2, female)"  Prior syncope Feb to march 2019, secondary to hypotension Syncope preceded by ringing in ears  Paracentesis January 2020 Had 2 times  Echocardiogram April 2019, report reviewed personally by myself Normal ejection fraction Mild to moderate MR   Prior CV studies:   The following studies were reviewed today:    Past Medical History:  Diagnosis Date  . Acute encephalopathy 05/22/2018  . Allergy   . Anxiety   . Ascites   . C. difficile colitis 07/10/2015  . Cancer (HCC)    HX OF CANCER OF UTERUS   . Cirrhosis of liver not due to alcohol ([place]) 2016  . Degenerative disk disease   . Diverticulitis   . Gastroparesis   . GERD (gastroesophageal reflux disease)   . History of hiatal hernia   . Hypertension   . Hypothyroid   . Hypothyroidism due to amiodarone   . Ileus (  [place]) 08/01/2015  . Intussusception intestine ([place]) 05/2015  . Orthostatic hypotension   . PAF (paroxysmal atrial fibrillation) ([place]) 03/2015   a. new onset 03/2015 in setting of intractable N/V; b. on Eliquis 5 mg bid; c. CHADSVASc 4 (DM, TIA x 2, female)  . Pancreatitis   . Pneumonia 11/14/2015  . Right ureteral stone 07/14/2016  . Sepsis ([place]) 12/15/2017   . Sick sinus syndrome ([place])   . Stomach ulcer   . Stroke [facility])    with minimal left sided weakness  . Syncope 01/2015  . Syncope due to orthostatic hypotension 05/18/2015  . Tachyarrhythmia 01/10/2016  . TIA (transient ischemic attack) 02/2015  . Type 1 diabetes ([place])    on levemir  . UTERINE CANCER, HX OF 03/27/2007   Qualifier: Diagnosis of  By: Maxie Better FNP, Rosalita Levan   . UTI (urinary tract infection) 05/22/2018   Past Surgical History:  Procedure Laterality Date  . ABDOMINAL HYSTERECTOMY    . CARDIAC CATHETERIZATION N/A 01/12/2016   Procedure: Left Heart Cath and Coronary Angiography;  Surgeon: Wellington Hampshire, MD;  Location: [facility];  Service: Cardiovascular;  Laterality: N/A;  . CHOLECYSTECTOMY    . CYSTOSCOPY/URETEROSCOPY/HOLMIUM LASER Right 07/14/2016   Procedure: CYSTOSCOPY/URETEROSCOPY/HOLMIUM LASER;  Surgeon: Alexis Frock, MD;  Location: [facility] ORS;  Service: Urology;  Laterality: Right;  . ESOPHAGOGASTRODUODENOSCOPY N/A 04/04/2015   Procedure: ESOPHAGOGASTRODUODENOSCOPY (EGD);  Surgeon: Hulen Luster, MD;  Location: [facility] ENDOSCOPY;  Service: Endoscopy;  Laterality: N/A;  . ESOPHAGOGASTRODUODENOSCOPY N/A 12/28/2017   Procedure: ESOPHAGOGASTRODUODENOSCOPY (EGD);  Surgeon: Lin Landsman, MD;  Location[facility] ENDOSCOPY;  Service: Gastroenterology;  Laterality: N/A;  . ESOPHAGOGASTRODUODENOSCOPY (EGD) WITH PROPOFOL N/A 01/18/2016   Procedure: ESOPHAGOGASTRODUODENOSCOPY (EGD) WITH PROPOFOL;  Surgeon: Lucilla Lame, MD;  Location: [facility] ENDOSCOPY;  Service: Endoscopy;  Laterality: N/A;  . FLEXIBLE SIGMOIDOSCOPY N/A 01/18/2016   Procedure: FLEXIBLE SIGMOIDOSCOPY;  Surgeon: Lucilla Lame, MD;  Location: [facility] ENDOSCOPY;  Service: Endoscopy;  Laterality: N/A;  . HERNIA REPAIR        Allergies:   Aspirin, Codeine sulfate, Erythromycin, Prednisone, Rosiglitazone maleate, and Tetanus-diphtheria toxoids td   Social History   Tobacco Use  . Smoking status: Former Smoker     Types: Cigarettes  . Smokeless tobacco: Never Used  . Tobacco comment: 25 years ago and only smoked occasionally  Substance Use Topics  . Alcohol use: No  . Drug use: No     Current Outpatient Medications on File Prior to Visit  Medication Sig Dispense Refill  . albuterol (PROVENTIL HFA;VENTOLIN HFA) 108 (90 Base) MCG/ACT inhaler Inhale 2 puffs into the lungs every 6 (six) hours as needed for wheezing or shortness of breath. 1 Inhaler 2  . apixaban (ELIQUIS) 5 MG TABS tablet Take 5 mg by mouth 2 (two) times daily.    Derrill Memo ON 08/10/2019] cyclobenzaprine (FLEXERIL) 10 MG tablet Take 1 tablet (10 mg total) by mouth 3 (three) times daily as needed for muscle spasms. Must last 30 days. 270 tablet 1  . HYDROcodone-acetaminophen (NORCO/VICODIN) 5-325 MG tablet Take 1 tablet by mouth every 6 (six) hours as needed for moderate pain. 30 tablet 0  . Insulin Detemir (LEVEMIR) 100 UNIT/ML Pen Inject 30 units in the morning and 30 units at bedtime. (Patient taking differently: Inject 25 units in the morning and 30 units at bedtime.) 15 mL 5  . nitroGLYCERIN (NITROSTAT) 0.3 MG SL tablet Place 1 tablet (0.3 mg total) under the tongue every 5 (five) minutes as needed for chest  pain. 100 tablet 0  . [START ON 09/11/2019] oxyCODONE (OXY IR/ROXICODONE) 5 MG immediate release tablet Take 1 tablet (5 mg total) by mouth every 4 (four) hours as needed for severe pain. Must last 30 days. Max: 6/day 180 tablet 0  . sertraline (ZOLOFT) 25 MG tablet TAKE 1 TABLET AT BEDTIME FOR ANXIETY 30 tablet 0  . sucralfate (CARAFATE) 1 g tablet Take 1 tablet (1 g total) by mouth 4 (four) times daily -  with meals and at bedtime. 90 tablet 0  . oxyCODONE (OXY IR/ROXICODONE) 5 MG immediate release tablet Take 1 tablet (5 mg total) by mouth every 4 (four) hours as needed for severe pain. Must last 30 days. Max: 6/day (Patient not taking: Reported on 07/29/2019) 180 tablet 0  . [START ON 08/12/2019] oxyCODONE (OXY IR/ROXICODONE) 5 MG  immediate release tablet Take 1 tablet (5 mg total) by mouth every 4 (four) hours as needed for severe pain. Must last 30 days. Max: 6/day (Patient not taking: Reported on 07/29/2019) 180 tablet 0   No current facility-administered medications on file prior to visit.      Family Hx: The patient's family history includes CAD in her brother and sister; Heart attack in her sister; Hypertension in her mother.  ROS:   Please see the history of present illness.    Review of Systems  Constitutional: Positive for malaise/fatigue.  HENT: Negative.   Respiratory: Negative.   Cardiovascular: Negative.   Gastrointestinal: Negative.   Musculoskeletal: Negative.   Neurological: Positive for dizziness.  Psychiatric/Behavioral: Negative.   All other systems reviewed and are negative.    Labs/Other Tests and Data Reviewed:    Recent Labs: 05/16/2019: TSH 3.399 07/14/2019: Magnesium 2.1 07/24/2019: ALT 17; BUN 12; Creatinine, Ser 1.34; Hemoglobin 10.0; Platelets 170; Potassium 3.7; Sodium 136   Recent Lipid Panel Lab Results  Component Value Date/Time   CHOL 104 09/12/2018 10:05 AM   CHOL 182 11/12/2011 03:25 AM   TRIG 90.0 09/12/2018 10:05 AM   TRIG 149 11/12/2011 03:25 AM   HDL 43.40 09/12/2018 10:05 AM   HDL 23 (L) 11/12/2011 03:25 AM   CHOLHDL 2 09/12/2018 10:05 AM   LDLCALC 42 09/12/2018 10:05 AM   LDLCALC 129 (H) 11/12/2011 03:25 AM    Wt Readings from Last 3 Encounters:  07/29/19 150 lb 12 oz (68.4 kg)  07/24/19 135 lb (61.2 kg)  07/14/19 153 lb (69.4 kg)     Exam:    Vital Signs: Vital signs may also be detailed in the HPI BP 120/80 (BP Location: Left Arm, Patient Position: Sitting, Cuff Size: Normal)   Pulse (!) 118   Ht 5' 3"  (1.6 m)   Wt 150 lb 12 oz (68.4 kg)   SpO2 98%   BMI 26.70 kg/m   Constitutional:  oriented to person, place, and time. No distress.  Presenting in a wheelchair, very weak standing up HENT:  Head: Grossly normal Eyes:  no discharge. No scleral  icterus.  Neck: No JVD, no carotid bruits  Cardiovascular: Regular tachycardia, no murmurs appreciated Pulmonary/Chest: Clear to auscultation bilaterally, no wheezes or rails Abdominal: Soft.  no distension.  no tenderness.  Musculoskeletal: Normal range of motion Neurological:  normal muscle tone. Coordination normal. No atrophy Skin: Skin warm and dry Psychiatric: normal affect, pleasant   ASSESSMENT & PLAN:    Problem List Items Addressed This Visit      Cardiology Problems   Atrial fibrillation ([place]) - Primary   Relevant Medications   metoprolol tartrate (  LOPRESSOR) 25 MG tablet   midodrine (PROAMATINE) 5 MG tablet   Other Relevant Orders   EKG 12-Lead   Hyperlipidemia with target LDL less than 100 (Chronic)   Relevant Medications   metoprolol tartrate (LOPRESSOR) 25 MG tablet   midodrine (PROAMATINE) 5 MG tablet   Orthostatic hypotension   Relevant Medications   metoprolol tartrate (LOPRESSOR) 25 MG tablet   midodrine (PROAMATINE) 5 MG tablet     Other   Sinus tachycardia     Chest pain Atypical in nature, musculoskeletal, hurts to move or push on the left chest, will take a deep breath Recommend she ibuprofen with her pain medication sparingly PACs -CT scan chest reviewed from the hospital with no significant coronary calcification, there is mild calcification in the aortic arch  Orthostatic hypotension With a history of syncope Markedly orthostatic on today's visit, midodrine held on prior hospital visit 2 weeks ago -Over 40 point drop in systolic pressure standing up with weakness Monitor climbing heart rate 118 up to 130s standing Recommend she restart midodrine 5 mg 3 times daily Likely need midodrine 10 mg 3 times daily and follow-up for continued orthostasis  Paroxysmal atrial fibrillation In the setting of colitis with septic shock History of stroke Previously on amiodarone, discontinued in April 2017 Not a good candidate for Eliquis at this time  given high fall risk, cirrhosis We will continue to monitor Increase beta-blocker as below  Syncope/dizziness Syncope episodes in the past orthostatic hypotension  resolved on midodrine 10 mg 3 times daily midodrine held on recent hospital visit, We will restart midodrine as above 5 mg 3 times daily but likely may need 10 mg 3 times daily Recommend she not lay supine on midodrine  Sinus tachycardia Chronically elevated heart rate, asymptomatic On prior office visit was changed to metoprolol succinate 25 twice daily 1 hospital visit this was decreased down to metoprolol titrate 12.5 twice daily Is having symptomatic tachycardia day and night Arrived to 130 standing in the setting of orthostasis We will increase metoprolol tartrate up to 25 twice daily She would likely benefit from 50 twice daily but may need to increase midodrine up to 10 in follow-up  Gastroparesis History of chronic nausea Recommend she maintain her fluids given hypotension and tachycardia  Disposition: Follow-up in 2 weeks   Total encounter time more than 45 minutes  Greater than 50% was spent in counseling and coordination of care with the patient    Signed, [name], [address] [place] [id number], [place], [place] [address]

## 2019-07-29 NOTE — Telephone Encounter (Signed)
Pt seen by Dr. Rockey Situ this morning in clinic at 9:20AM.

## 2019-07-29 NOTE — Patient Instructions (Addendum)
Medication Instructions:  Your physician has recommended you make the following change in your medication:  1. INCREASE Metoprolol tartrate to 25 mg twice a day 2. RESTART Midodrine 5 mg three times a day  If you need a refill on your cardiac medications before your next appointment, please call your pharmacy.    Lab work: No new labs needed   If you have labs (blood work) drawn today and your tests are completely normal, you will receive your results only by: [name] MyChart Message (if you have MyChart) OR . A paper copy in the mail If you have any lab test that is abnormal or we need to change your treatment, we will call you to review the results.   Testing/Procedures: No new testing needed   Follow-Up: At [facility], you and your health needs are our priority.  As part of our continuing mission to provide you with exceptional heart care, we have created designated Provider Care Teams.  These Care Teams include your primary Cardiologist (physician) and Advanced Practice Providers (APPs -  Physician Assistants and Nurse Practitioners) who all work together to provide you with the care you need, when you need it.  . You will need a follow up appointment in 2 weeks   . Providers on your designated Care Team:   . Murray Hodgkins, NP . Christell Faith, PA-C . Marrianne Mood, PA-C  Any Other Special Instructions Will Be Listed Below (If Applicable).  For educational health videos Log in to : www.myemmi.com Or : [email or web address], password : [email or web address]  How to Take Your Blood Pressure You can take your blood pressure at home with a machine. You may need to check your blood pressure at home:  To check if you have high blood pressure (hypertension).  To check your blood pressure over time.  To make sure your blood pressure medicine is working. Supplies needed: You will need a blood pressure machine, or monitor. You can buy one at a drugstore or online. When choosing one:  Choose one  with an arm cuff.  Choose one that wraps around your upper arm. Only one finger should fit between your arm and the cuff.  Do not choose one that measures your blood pressure from your wrist or finger. Your doctor can suggest a monitor. How to prepare Avoid these things for 30 minutes before checking your blood pressure:  Drinking caffeine.  Drinking alcohol.  Eating.  Smoking.  Exercising. Five minutes before checking your blood pressure:  Pee.  Sit in a dining chair. Avoid sitting in a soft couch or armchair.  Be quiet. Do not talk. How to take your blood pressure Follow the instructions that came with your machine. If you have a digital blood pressure monitor, these may be the instructions: 1. Sit up straight. 2. Place your feet on the floor. Do not cross your ankles or legs. 3. Rest your left arm at the level of your heart. You may rest it on a table, desk, or chair. 4. Pull up your shirt sleeve. 5. Wrap the blood pressure cuff around the upper part of your left arm. The cuff should be 1 inch (2.5 cm) above your elbow. It is best to wrap the cuff around bare skin. 6. Fit the cuff snugly around your arm. You should be able to place only one finger between the cuff and your arm. 7. Put the cord inside the groove of your elbow. 8. Press the power button. 9. Sit quietly while the cuff fills  with air and loses air. 10. Write down the numbers on the screen. 11. Wait 2-3 minutes and then repeat steps 1-10. What do the numbers mean? Two numbers make up your blood pressure. The first number is called systolic pressure. The second is called diastolic pressure. An example of a blood pressure reading is "120 over 80" (or 120/80). If you are an adult and do not have a medical condition, use this guide to find out if your blood pressure is normal: Normal  First number: below 120.  Second number: below 80. Elevated  First number: 120-129.  Second number: below 80. Hypertension  stage 1  First number: 130-139.  Second number: 80-89. Hypertension stage 2  First number: 140 or above.  Second number: 49 or above. Your blood pressure is above normal even if only the top or bottom number is above normal. Follow these instructions at home:  Check your blood pressure as often as your doctor tells you to.  Take your monitor to your next doctor's appointment. Your doctor will: ? Make sure you are using it correctly. ? Make sure it is working right.  Make sure you understand what your blood pressure numbers should be.  Tell your doctor if your medicines are causing side effects. Contact a doctor if:  Your blood pressure keeps being high. Get help right away if:  Your first blood pressure number is higher than 180.  Your second blood pressure number is higher than 120. This information is not intended to replace advice given to you by your health care provider. Make sure you discuss any questions you have with your health care provider. Document Released: 08/09/2008 Document Revised: 08/09/2017 Document Reviewed: 02/03/2016 Elsevier Patient Education  2020 [employer].

## 2019-07-30 ENCOUNTER — Telehealth: Payer: Self-pay | Admitting: Cardiovascular Disease

## 2019-07-30 DIAGNOSIS — K7460 Unspecified cirrhosis of liver: Secondary | ICD-10-CM

## 2019-07-30 DIAGNOSIS — Z79899 Other long term (current) drug therapy: Secondary | ICD-10-CM

## 2019-07-30 DIAGNOSIS — I69354 Hemiplegia and hemiparesis following cerebral infarction affecting left non-dominant side: Secondary | ICD-10-CM

## 2019-07-30 DIAGNOSIS — N179 Acute kidney failure, unspecified: Secondary | ICD-10-CM | POA: Diagnosis not present

## 2019-07-30 DIAGNOSIS — I10 Essential (primary) hypertension: Secondary | ICD-10-CM | POA: Diagnosis not present

## 2019-07-30 DIAGNOSIS — I480 Paroxysmal atrial fibrillation: Secondary | ICD-10-CM | POA: Diagnosis not present

## 2019-07-30 DIAGNOSIS — Z8701 Personal history of pneumonia (recurrent): Secondary | ICD-10-CM

## 2019-07-30 DIAGNOSIS — E860 Dehydration: Secondary | ICD-10-CM

## 2019-07-30 DIAGNOSIS — E1043 Type 1 diabetes mellitus with diabetic autonomic (poly)neuropathy: Secondary | ICD-10-CM

## 2019-07-30 DIAGNOSIS — E032 Hypothyroidism due to medicaments and other exogenous substances: Secondary | ICD-10-CM

## 2019-07-30 DIAGNOSIS — Z8541 Personal history of malignant neoplasm of cervix uteri: Secondary | ICD-10-CM

## 2019-07-30 DIAGNOSIS — I495 Sick sinus syndrome: Secondary | ICD-10-CM

## 2019-07-30 DIAGNOSIS — T462X5S Adverse effect of other antidysrhythmic drugs, sequela: Secondary | ICD-10-CM

## 2019-07-30 DIAGNOSIS — Z87891 Personal history of nicotine dependence: Secondary | ICD-10-CM

## 2019-07-30 DIAGNOSIS — K219 Gastro-esophageal reflux disease without esophagitis: Secondary | ICD-10-CM

## 2019-07-30 DIAGNOSIS — F419 Anxiety disorder, unspecified: Secondary | ICD-10-CM

## 2019-07-30 DIAGNOSIS — Z9181 History of falling: Secondary | ICD-10-CM

## 2019-07-30 DIAGNOSIS — Z87442 Personal history of urinary calculi: Secondary | ICD-10-CM

## 2019-07-30 DIAGNOSIS — I959 Hypotension, unspecified: Secondary | ICD-10-CM

## 2019-07-30 DIAGNOSIS — Z8719 Personal history of other diseases of the digestive system: Secondary | ICD-10-CM

## 2019-07-30 DIAGNOSIS — Z87440 Personal history of urinary (tract) infections: Secondary | ICD-10-CM

## 2019-07-30 DIAGNOSIS — N136 Pyonephrosis: Secondary | ICD-10-CM | POA: Diagnosis not present

## 2019-07-30 DIAGNOSIS — Z7901 Long term (current) use of anticoagulants: Secondary | ICD-10-CM

## 2019-07-30 DIAGNOSIS — K3184 Gastroparesis: Secondary | ICD-10-CM

## 2019-07-30 DIAGNOSIS — Z79891 Long term (current) use of opiate analgesic: Secondary | ICD-10-CM

## 2019-07-30 NOTE — Telephone Encounter (Signed)
Spoke with patient and reviewed medications and clarified that she is on Midodrine 5 mg three times daily. She got it in from mail order and was just reviewing because she had been on higher dose previously. Confirmed her dosage and she verbalized understanding. She then ask about sucarlfate and asked what that was for. Reviewed what the common uses were for and that this is usually managed by PCP or GI physicians. She reports she has appointment tomorrow with PCP and will check with her on refills. She was very appreciative for the call with no further questions at this time.

## 2019-07-30 NOTE — Telephone Encounter (Signed)
Patient saw Dr Rockey Situ 11/18 Would like a medication clarification for midodrine and sucralfate  Please call to discuss

## 2019-07-31 ENCOUNTER — Other Ambulatory Visit: Payer: Self-pay

## 2019-07-31 ENCOUNTER — Ambulatory Visit (INDEPENDENT_AMBULATORY_CARE_PROVIDER_SITE_OTHER): Payer: Medicare Other | Admitting: Primary Care

## 2019-07-31 ENCOUNTER — Encounter: Payer: Self-pay | Admitting: Primary Care

## 2019-07-31 VITALS — BP 128/80 | HR 120 | Temp 97.4°F | Ht 63.0 in | Wt 157.2 lb

## 2019-07-31 DIAGNOSIS — Z23 Encounter for immunization: Secondary | ICD-10-CM | POA: Diagnosis not present

## 2019-07-31 DIAGNOSIS — R296 Repeated falls: Secondary | ICD-10-CM | POA: Diagnosis not present

## 2019-07-31 DIAGNOSIS — R29898 Other symptoms and signs involving the musculoskeletal system: Secondary | ICD-10-CM | POA: Diagnosis not present

## 2019-07-31 DIAGNOSIS — I495 Sick sinus syndrome: Secondary | ICD-10-CM

## 2019-07-31 DIAGNOSIS — F411 Generalized anxiety disorder: Secondary | ICD-10-CM

## 2019-07-31 DIAGNOSIS — I10 Essential (primary) hypertension: Secondary | ICD-10-CM

## 2019-07-31 DIAGNOSIS — N179 Acute kidney failure, unspecified: Secondary | ICD-10-CM | POA: Diagnosis not present

## 2019-07-31 LAB — BASIC METABOLIC PANEL
BUN: 18 mg/dL (ref 6–23)
CO2: 25 mEq/L (ref 19–32)
Calcium: 8.8 mg/dL (ref 8.4–10.5)
Chloride: 101 mEq/L (ref 96–112)
Creatinine, Ser: 1.31 mg/dL — ABNORMAL HIGH (ref 0.40–1.20)
GFR: 40.67 mL/min — ABNORMAL LOW (ref >60.00)
Glucose, Bld: 436 mg/dL — ABNORMAL HIGH (ref 70–99)
Potassium: 4 mEq/L (ref 3.5–5.1)
Sodium: 134 mEq/L — ABNORMAL LOW (ref 135–145)

## 2019-07-31 MED ORDER — ZOSTER VAC RECOMB ADJUVANTED 50 MCG/0.5ML IM SUSR: 0.5000 mL | Freq: Once | INTRAMUSCULAR | 1 refills | Status: AC

## 2019-07-31 MED ORDER — SERTRALINE HCL 25 MG PO TABS: ORAL_TABLET | ORAL | 3 refills | Status: DC

## 2019-07-31 NOTE — Assessment & Plan Note (Signed)
Improved today but continues with tachycardia.  Continue current regimen.

## 2019-07-31 NOTE — Assessment & Plan Note (Signed)
Ongoing, chronic, seems to be about the same despite home health physical therapy.   Given progressive weakness we will send her to neurology for further evaluation and to rule out any neuromuscular disorders.   Her falls could be secondary to her medications, inactivity, complex medical history but will rule out other causes. Continue home PT.

## 2019-07-31 NOTE — Assessment & Plan Note (Signed)
Chronic with little improvement despite months of home health PT.   Weakness could be secondary to complex medical history, sedating medications, inactivity but given continued weakness then we will send her to neurology to rule out neuromuscular disorder.

## 2019-07-31 NOTE — Progress Notes (Signed)
Subjective:    Patient ID: [name], female    DOB: 1955-10-30, 63 y.o.   MRN: [id number]  HPI  [name] is a 63 year old female with a history of hypertension, CVA, uncontrolled diabetes, paroxysmal atrial fibrillation, cirrhosis, CAD, hypothyroidism, chronic back pain, CKD, GAD who presents today for [facility] Follow up.  She presented to [facility] ED via EMS on 07/14/19 with a chief complaint of two month history of weakness with decreased appetite, recurrent falls. She underwent CT and xrays which didn't show acute abnormality, labs did show increased creatinine, elevated CK, and elevated troponin. Also with chronic tachycardia that improved with fluids. She was admitted for AKI and acute dehydration.   During her hospital stay her ECG didn't suggest MI. She was treated with IV fluids with improvement in creatinine and tachycardia. UA consistent for UTI so she was treated with antibiotics. Urology consulted who strongly advised she follow up in the outpatient setting for ureteral stent removal. Renal ultrasound showed right sided hydroureter and stent that was placed in early 2020. She was discharged home on 07/18/19 with recommendations for PCP and Urology follow up.  Since her discharge home she returned to [facility] ED on 07/24/19 with a chief complaint of chest pain. Pain located to the middle of the chest towards the left with increased pain with deep breathing and movement. ECG, Troponin weren't suggestive of MI. CT angio chest suggestive of "mononeuritis due to diabetes or alternatively pleurisy". She was discharged home later that evening.  A1C of 9.7 in September 2020. She is checking her blood sugars twice daily and is getting readings of low 200's in the morning fasting and high 100's around bedtime. She is compliant to her Levemir 30 units BID.   She has an appointment with Urology in December 2020 for stent removal. She recently visited her cardiologist who initiated her on  midodrine, sucralfate, and nitroglycerin.   She continues to struggle with balance to her lower extremities and has fallen "11-12 times" since her hospital discharge several weeks ago. She continues to work with physical therapy at home which has helped some but she continues to fall. She has never seen neurology for her lower extremity weakness. She denies numbness/tingling. She does follow with pain management and is managed on Oxycodone and Flexeril.  Review of Systems  Respiratory: Negative for shortness of breath.   Cardiovascular: Negative for chest pain.  Musculoskeletal: Positive for arthralgias and back pain.  Neurological: Positive for weakness. Negative for numbness.       Past Medical History:  Diagnosis Date  . Acute encephalopathy 05/22/2018  . Allergy   . Anxiety   . Ascites   . C. difficile colitis 07/10/2015  . Cancer (HCC)    HX OF CANCER OF UTERUS   . Cirrhosis of liver not due to alcohol ([place]) 2016  . Degenerative disk disease   . Diverticulitis   . Gastroparesis   . GERD (gastroesophageal reflux disease)   . History of hiatal hernia   . Hypertension   . Hypothyroid   . Hypothyroidism due to amiodarone   . Ileus ([place]) 08/01/2015  . Intussusception intestine ([place]) 05/2015  . Orthostatic hypotension   . PAF (paroxysmal atrial fibrillation) ([place]) 03/2015   a. new onset 03/2015 in setting of intractable N/V; b. on Eliquis 5 mg bid; c. CHADSVASc 4 (DM, TIA x 2, female)  . Pancreatitis   . Pneumonia 11/14/2015  . Right ureteral stone 07/14/2016  . Sepsis ([place])  12/15/2017  . Sick sinus syndrome ([place])   . Stomach ulcer   . Stroke [facility])    with minimal left sided weakness  . Syncope 01/2015  . Syncope due to orthostatic hypotension 05/18/2015  . Tachyarrhythmia 01/10/2016  . TIA (transient ischemic attack) 02/2015  . Type 1 diabetes ([place])    on levemir  . UTERINE CANCER, HX OF 03/27/2007   Qualifier: Diagnosis of  By: Maxie Better FNP, Rosalita Levan   . UTI (urinary  tract infection) 05/22/2018     Social History   Socioeconomic History  . Marital status: Married    Spouse name: [name]   . Number of children: Not on file  . Years of education: Not on file  . Highest education level: Not on file  Occupational History  . Occupation: Disabled 2nd back problems  Social Needs  . Financial resource strain: Not hard at all  . Food insecurity    Worry: Never true    Inability: Never true  . Transportation needs    Medical: No    Non-medical: No  Tobacco Use  . Smoking status: Former Smoker    Types: Cigarettes  . Smokeless tobacco: Never Used  . Tobacco comment: 25 years ago and only smoked occasionally  Substance and Sexual Activity  . Alcohol use: No  . Drug use: No  . Sexual activity: Not Currently  Lifestyle  . Physical activity    Days per week: 0 days    Minutes per session: 0 min  . Stress: Not at all  Relationships  . Social Herbalist on phone: Patient refused    Gets together: Patient refused    Attends religious service: Patient refused    Active member of club or organization: Patient refused    Attends meetings of clubs or organizations: Patient refused    Relationship status: Patient refused  . Intimate partner violence    Fear of current or ex partner: Patient refused    Emotionally abused: Patient refused    Physically abused: Patient refused    Forced sexual activity: Patient refused  Other Topics Concern  . Not on file  Social History Narrative   Lives in [place], [place] with her husband and 2 sons.    Past Surgical History:  Procedure Laterality Date  . ABDOMINAL HYSTERECTOMY    . CARDIAC CATHETERIZATION N/A 01/12/2016   Procedure: Left Heart Cath and Coronary Angiography;  Surgeon: Wellington Hampshire, MD;  Location: [place] CV LAB;  Service: Cardiovascular;  Laterality: N/A;  . CHOLECYSTECTOMY    . CYSTOSCOPY/URETEROSCOPY/HOLMIUM LASER Right 07/14/2016   Procedure:  CYSTOSCOPY/URETEROSCOPY/HOLMIUM LASER;  Surgeon: Alexis Frock, MD;  Location: [facility] ORS;  Service: Urology;  Laterality: Right;  . ESOPHAGOGASTRODUODENOSCOPY N/A 04/04/2015   Procedure: ESOPHAGOGASTRODUODENOSCOPY (EGD);  Surgeon: Hulen Luster, MD;  Location: [facility] ENDOSCOPY;  Service: Endoscopy;  Laterality: N/A;  . ESOPHAGOGASTRODUODENOSCOPY N/A 12/28/2017   Procedure: ESOPHAGOGASTRODUODENOSCOPY (EGD);  Surgeon: Lin Landsman, MD;  Location: [facility] ENDOSCOPY;  Service: Gastroenterology;  Laterality: N/A;  . ESOPHAGOGASTRODUODENOSCOPY (EGD) WITH PROPOFOL N/A 01/18/2016   Procedure: ESOPHAGOGASTRODUODENOSCOPY (EGD) WITH PROPOFOL;  Surgeon: Lucilla Lame, MD;  Location: [facility] ENDOSCOPY;  Service: Endoscopy;  Laterality: N/A;  . FLEXIBLE SIGMOIDOSCOPY N/A 01/18/2016   Procedure: FLEXIBLE SIGMOIDOSCOPY;  Surgeon: Lucilla Lame, MD;  Location: [facility] ENDOSCOPY;  Service: Endoscopy;  Laterality: N/A;  . HERNIA REPAIR      Family History  Problem Relation Age of Onset  . Hypertension Mother   . CAD  Sister   . Heart attack Sister        Deceased 2014-11-19  . CAD Brother     Allergies  Allergen Reactions  . Aspirin Rash  . Codeine Sulfate Rash  . Erythromycin Rash    Reaction:  Fever   . Prednisone Swelling  . Rosiglitazone Maleate Swelling  . Tetanus-Diphtheria Toxoids Td Rash and Other (See Comments)    Reaction:  Fever     Current Outpatient Medications on File Prior to Visit  Medication Sig Dispense Refill  . albuterol (PROVENTIL HFA;VENTOLIN HFA) 108 (90 Base) MCG/ACT inhaler Inhale 2 puffs into the lungs every 6 (six) hours as needed for wheezing or shortness of breath. 1 Inhaler 2  . apixaban (ELIQUIS) 5 MG TABS tablet Take 5 mg by mouth 2 (two) times daily.    Derrill Memo ON 08/10/2019] cyclobenzaprine (FLEXERIL) 10 MG tablet Take 1 tablet (10 mg total) by mouth 3 (three) times daily as needed for muscle spasms. Must last 30 days. 270 tablet 1  . HYDROcodone-acetaminophen (NORCO/VICODIN) 5-325  MG tablet Take 1 tablet by mouth every 6 (six) hours as needed for moderate pain. 30 tablet 0  . Insulin Detemir (LEVEMIR) 100 UNIT/ML Pen Inject 30 units in the morning and 30 units at bedtime. (Patient taking differently: Inject 25 units in the morning and 30 units at bedtime.) 15 mL 5  . metoprolol tartrate (LOPRESSOR) 25 MG tablet Take 1 tablet (25 mg total) by mouth 2 (two) times daily. 180 tablet 3  . midodrine (PROAMATINE) 5 MG tablet Take 1 tablet (5 mg total) by mouth 3 (three) times daily with meals. 270 tablet 3  . nitroGLYCERIN (NITROSTAT) 0.3 MG SL tablet Place 1 tablet (0.3 mg total) under the tongue every 5 (five) minutes as needed for chest pain. 100 tablet 0  . oxyCODONE (OXY IR/ROXICODONE) 5 MG immediate release tablet Take 1 tablet (5 mg total) by mouth every 4 (four) hours as needed for severe pain. Must last 30 days. Max: 6/day 180 tablet 0  . [START ON 08/12/2019] oxyCODONE (OXY IR/ROXICODONE) 5 MG immediate release tablet Take 1 tablet (5 mg total) by mouth every 4 (four) hours as needed for severe pain. Must last 30 days. Max: 6/day 180 tablet 0  . [START ON 09/11/2019] oxyCODONE (OXY IR/ROXICODONE) 5 MG immediate release tablet Take 1 tablet (5 mg total) by mouth every 4 (four) hours as needed for severe pain. Must last 30 days. Max: 6/day 180 tablet 0  . sucralfate (CARAFATE) 1 g tablet Take 1 tablet (1 g total) by mouth 4 (four) times daily -  with meals and at bedtime. 90 tablet 0   No current facility-administered medications on file prior to visit.     BP 128/80   Pulse (!) 120   Temp (!) 97.4 F (36.3 C) (Temporal)   Ht 5' 3"  (1.6 m)   Wt 157 lb 4 oz (71.3 kg)   SpO2 98%   BMI 27.86 kg/m    Objective:   Physical Exam  Constitutional: She appears well-nourished.  Neck: Neck supple.  Cardiovascular: Normal rate and regular rhythm.  Respiratory: Effort normal and breath sounds normal.  Musculoskeletal:     Comments: Appears very weak and unstable with standing  and ambulation. Using cane and her husband for balance.   Skin: Skin is warm and dry.  Psychiatric: She has a normal mood and affect.           Assessment & Plan:

## 2019-07-31 NOTE — Assessment & Plan Note (Signed)
Recent hospital admission for AKI and dehydration. Repeat BMP pending.  Stressed the importance for proper hydration. She is also to follow up with Urology for ureteral stent removal.   Hospital notes, labs, imaging reviewed.

## 2019-07-31 NOTE — Patient Instructions (Signed)
You will be contacted regarding your referral to neurology.  Please let us know if you have not been contacted within two weeks.   Stop by the lab prior to leaving today. I will notify you of your results once received.   We will see you in December 2020 as scheduled.  Follow up with the Urologist as scheduled.  It was a pleasure to see you today!

## 2019-07-31 NOTE — Assessment & Plan Note (Signed)
Working with cardiology to reduce tachycardia.  Now on midodrine.  Continue metoprolol.

## 2019-07-31 NOTE — Addendum Note (Signed)
Addended by: Jacqualin Combes on: 07/31/2019 10:41 AM   Modules accepted: Orders

## 2019-07-31 NOTE — Assessment & Plan Note (Signed)
Following with cardiology.

## 2019-08-05 ENCOUNTER — Telehealth: Payer: Self-pay

## 2019-08-05 NOTE — Telephone Encounter (Signed)
Erline Levine from [facility] contacted our office and states she needs a verbal order for PT twice weekly for 4 weeks, and then Once a week for 3 weeks. You can return her phone call at [phone].   Erline Levine also states that while she was with the patient today, patient states that during her hospital stay on 11/3 - she was told that she had 2 additional strokes. Erline Levine states that she does not see anything documented in the patient's chart about this - and I myself did not see anything either. But I wanted to reach out to [name] to see if there is anything you know of about this?   Thanks!

## 2019-08-05 NOTE — Telephone Encounter (Signed)
Approved.  I don't know anything about additional strokes. Her CT scan of the head did not mention this.

## 2019-08-10 NOTE — Telephone Encounter (Signed)
Spoken and notified [name] of [name] comments. Stacy verbalized understanding.

## 2019-08-11 ENCOUNTER — Telehealth: Payer: Self-pay | Admitting: Primary Care

## 2019-08-11 NOTE — Telephone Encounter (Signed)
Patient called in regards to her neurology referral She stated she would like the [place] location   Tuesday are not good for her but can do any other day some time around 11-12

## 2019-08-13 ENCOUNTER — Emergency Department: Payer: Medicare Other

## 2019-08-13 ENCOUNTER — Encounter: Payer: Self-pay | Admitting: Emergency Medicine

## 2019-08-13 ENCOUNTER — Inpatient Hospital Stay
Admission: EM | Admit: 2019-08-13 | Discharge: 2019-08-15 | DRG: 641 | Disposition: A | Discharge status: Home Health | Payer: Medicare Other | Attending: Internal Medicine | Admitting: Internal Medicine

## 2019-08-13 ENCOUNTER — Other Ambulatory Visit: Payer: Self-pay

## 2019-08-13 DIAGNOSIS — G894 Chronic pain syndrome: Secondary | ICD-10-CM | POA: Diagnosis present

## 2019-08-13 DIAGNOSIS — R0789 Other chest pain: Secondary | ICD-10-CM | POA: Diagnosis not present

## 2019-08-13 DIAGNOSIS — K219 Gastro-esophageal reflux disease without esophagitis: Secondary | ICD-10-CM | POA: Diagnosis present

## 2019-08-13 DIAGNOSIS — Z87891 Personal history of nicotine dependence: Secondary | ICD-10-CM

## 2019-08-13 DIAGNOSIS — E860 Dehydration: Secondary | ICD-10-CM | POA: Diagnosis not present

## 2019-08-13 DIAGNOSIS — Z90710 Acquired absence of both cervix and uterus: Secondary | ICD-10-CM | POA: Diagnosis not present

## 2019-08-13 DIAGNOSIS — I480 Paroxysmal atrial fibrillation: Secondary | ICD-10-CM | POA: Diagnosis present

## 2019-08-13 DIAGNOSIS — E1129 Type 2 diabetes mellitus with other diabetic kidney complication: Secondary | ICD-10-CM | POA: Diagnosis present

## 2019-08-13 DIAGNOSIS — Z8711 Personal history of peptic ulcer disease: Secondary | ICD-10-CM | POA: Diagnosis not present

## 2019-08-13 DIAGNOSIS — I959 Hypotension, unspecified: Secondary | ICD-10-CM | POA: Diagnosis not present

## 2019-08-13 DIAGNOSIS — F341 Dysthymic disorder: Secondary | ICD-10-CM | POA: Diagnosis present

## 2019-08-13 DIAGNOSIS — S3993XA Unspecified injury of pelvis, initial encounter: Secondary | ICD-10-CM | POA: Diagnosis not present

## 2019-08-13 DIAGNOSIS — F411 Generalized anxiety disorder: Secondary | ICD-10-CM | POA: Diagnosis present

## 2019-08-13 DIAGNOSIS — M25551 Pain in right hip: Secondary | ICD-10-CM | POA: Diagnosis not present

## 2019-08-13 DIAGNOSIS — M545 Low back pain: Secondary | ICD-10-CM | POA: Diagnosis not present

## 2019-08-13 DIAGNOSIS — E039 Hypothyroidism, unspecified: Secondary | ICD-10-CM | POA: Diagnosis not present

## 2019-08-13 DIAGNOSIS — W19XXXA Unspecified fall, initial encounter: Secondary | ICD-10-CM | POA: Diagnosis present

## 2019-08-13 DIAGNOSIS — Z20828 Contact with and (suspected) exposure to other viral communicable diseases: Secondary | ICD-10-CM | POA: Diagnosis not present

## 2019-08-13 DIAGNOSIS — K7460 Unspecified cirrhosis of liver: Secondary | ICD-10-CM | POA: Diagnosis not present

## 2019-08-13 DIAGNOSIS — R000 Tachycardia, unspecified: Secondary | ICD-10-CM | POA: Diagnosis present

## 2019-08-13 DIAGNOSIS — S99922A Unspecified injury of left foot, initial encounter: Secondary | ICD-10-CM | POA: Diagnosis not present

## 2019-08-13 DIAGNOSIS — N179 Acute kidney failure, unspecified: Secondary | ICD-10-CM | POA: Diagnosis present

## 2019-08-13 DIAGNOSIS — Z79891 Long term (current) use of opiate analgesic: Secondary | ICD-10-CM

## 2019-08-13 DIAGNOSIS — R21 Rash and other nonspecific skin eruption: Secondary | ICD-10-CM | POA: Diagnosis present

## 2019-08-13 DIAGNOSIS — E782 Mixed hyperlipidemia: Secondary | ICD-10-CM | POA: Diagnosis present

## 2019-08-13 DIAGNOSIS — R296 Repeated falls: Secondary | ICD-10-CM | POA: Diagnosis present

## 2019-08-13 DIAGNOSIS — F419 Anxiety disorder, unspecified: Secondary | ICD-10-CM | POA: Diagnosis present

## 2019-08-13 DIAGNOSIS — Z8542 Personal history of malignant neoplasm of other parts of uterus: Secondary | ICD-10-CM | POA: Diagnosis not present

## 2019-08-13 DIAGNOSIS — M25552 Pain in left hip: Secondary | ICD-10-CM | POA: Diagnosis not present

## 2019-08-13 DIAGNOSIS — Z7901 Long term (current) use of anticoagulants: Secondary | ICD-10-CM

## 2019-08-13 DIAGNOSIS — I951 Orthostatic hypotension: Secondary | ICD-10-CM | POA: Diagnosis present

## 2019-08-13 DIAGNOSIS — K5900 Constipation, unspecified: Secondary | ICD-10-CM | POA: Diagnosis present

## 2019-08-13 DIAGNOSIS — Z8673 Personal history of transient ischemic attack (TIA), and cerebral infarction without residual deficits: Secondary | ICD-10-CM

## 2019-08-13 DIAGNOSIS — F329 Major depressive disorder, single episode, unspecified: Secondary | ICD-10-CM | POA: Diagnosis not present

## 2019-08-13 DIAGNOSIS — N1831 Chronic kidney disease, stage 3a: Secondary | ICD-10-CM | POA: Diagnosis present

## 2019-08-13 DIAGNOSIS — Z79899 Other long term (current) drug therapy: Secondary | ICD-10-CM

## 2019-08-13 DIAGNOSIS — I639 Cerebral infarction, unspecified: Secondary | ICD-10-CM

## 2019-08-13 DIAGNOSIS — E1122 Type 2 diabetes mellitus with diabetic chronic kidney disease: Secondary | ICD-10-CM | POA: Diagnosis present

## 2019-08-13 DIAGNOSIS — I129 Hypertensive chronic kidney disease with stage 1 through stage 4 chronic kidney disease, or unspecified chronic kidney disease: Secondary | ICD-10-CM | POA: Diagnosis not present

## 2019-08-13 DIAGNOSIS — I950 Idiopathic hypotension: Secondary | ICD-10-CM | POA: Diagnosis not present

## 2019-08-13 DIAGNOSIS — Z8249 Family history of ischemic heart disease and other diseases of the circulatory system: Secondary | ICD-10-CM

## 2019-08-13 DIAGNOSIS — I4891 Unspecified atrial fibrillation: Secondary | ICD-10-CM | POA: Diagnosis present

## 2019-08-13 DIAGNOSIS — Z794 Long term (current) use of insulin: Secondary | ICD-10-CM

## 2019-08-13 DIAGNOSIS — Z9049 Acquired absence of other specified parts of digestive tract: Secondary | ICD-10-CM

## 2019-08-13 DIAGNOSIS — G4489 Other headache syndrome: Secondary | ICD-10-CM | POA: Diagnosis not present

## 2019-08-13 DIAGNOSIS — R079 Chest pain, unspecified: Secondary | ICD-10-CM | POA: Diagnosis not present

## 2019-08-13 LAB — CBC
HCT: 35.8 % — ABNORMAL LOW (ref 36.0–46.0)
Hemoglobin: 11.2 g/dL — ABNORMAL LOW (ref 12.0–15.0)
MCH: 26.8 pg (ref 26.0–34.0)
MCHC: 31.3 g/dL (ref 30.0–36.0)
MCV: 85.6 fL (ref 80.0–100.0)
Platelets: 174 10*3/uL (ref 150–400)
RBC: 4.18 MIL/uL (ref 3.87–5.11)
RDW: 15.9 % — ABNORMAL HIGH (ref 11.5–15.5)
WBC: 8.2 10*3/uL (ref 4.0–10.5)
nRBC: 0 % (ref 0.0–0.2)

## 2019-08-13 LAB — GLUCOSE, CAPILLARY
Glucose-Capillary: 385 mg/dL — ABNORMAL HIGH (ref 70–99)
Glucose-Capillary: 166 mg/dL — ABNORMAL HIGH (ref 70–99)

## 2019-08-13 LAB — BASIC METABOLIC PANEL
Anion gap: 9 (ref 5–15)
BUN: 20 mg/dL (ref 8–23)
CO2: 19 mmol/L — ABNORMAL LOW (ref 22–32)
Calcium: 8.1 mg/dL — ABNORMAL LOW (ref 8.9–10.3)
Chloride: 106 mmol/L (ref 98–111)
Creatinine, Ser: 2.56 mg/dL — ABNORMAL HIGH (ref 0.44–1.00)
GFR calc Af Amer: 22 mL/min — ABNORMAL LOW (ref >60)
GFR calc non Af Amer: 19 mL/min — ABNORMAL LOW (ref >60)
Glucose, Bld: 273 mg/dL — ABNORMAL HIGH (ref 70–99)
Potassium: 3.2 mmol/L — ABNORMAL LOW (ref 3.5–5.1)
Sodium: 134 mmol/L — ABNORMAL LOW (ref 135–145)

## 2019-08-13 LAB — AMMONIA: Ammonia: 46 umol/L — ABNORMAL HIGH (ref 9–35)

## 2019-08-13 LAB — TROPONIN I (HIGH SENSITIVITY): Troponin I (High Sensitivity): 12 ng/L (ref <18)

## 2019-08-13 MED ORDER — SODIUM CHLORIDE 0.9 % IV SOLN
Freq: Once | INTRAVENOUS | Status: AC
  Administered 2019-08-13: 14:00:00 via INTRAVENOUS

## 2019-08-13 MED ORDER — POTASSIUM CHLORIDE CRYS ER 20 MEQ PO TBCR
40.0000 meq | EXTENDED_RELEASE_TABLET | Freq: Once | ORAL | Status: AC
  Administered 2019-08-13: 40 meq via ORAL
  Filled 2019-08-13: qty 2

## 2019-08-13 MED ORDER — OXYCODONE HCL 5 MG PO TABS
5.0000 mg | ORAL_TABLET | ORAL | Status: DC | PRN
  Administered 2019-08-14 – 2019-08-15 (×3): 5 mg via ORAL
  Filled 2019-08-13 (×3): qty 1

## 2019-08-13 MED ORDER — CYCLOBENZAPRINE HCL 10 MG PO TABS: 10.0000 mg | ORAL_TABLET | Freq: Three times a day (TID) | ORAL | Status: DC | PRN

## 2019-08-13 MED ORDER — ACETAMINOPHEN 325 MG PO TABS: 650.0000 mg | ORAL_TABLET | Freq: Four times a day (QID) | ORAL | Status: DC | PRN

## 2019-08-13 MED ORDER — ACETAMINOPHEN 650 MG RE SUPP: 650.0000 mg | Freq: Four times a day (QID) | RECTAL | Status: DC | PRN

## 2019-08-13 MED ORDER — OXYCODONE-ACETAMINOPHEN 5-325 MG PO TABS
2.0000 | ORAL_TABLET | Freq: Once | ORAL | Status: AC
  Administered 2019-08-13: 2 via ORAL
  Filled 2019-08-13: qty 2

## 2019-08-13 MED ORDER — ALBUTEROL SULFATE (2.5 MG/3ML) 0.083% IN NEBU: 3.0000 mL | INHALATION_SOLUTION | Freq: Four times a day (QID) | RESPIRATORY_TRACT | Status: DC | PRN

## 2019-08-13 MED ORDER — MIDODRINE HCL 5 MG PO TABS
5.0000 mg | ORAL_TABLET | Freq: Three times a day (TID) | ORAL | Status: DC
  Administered 2019-08-14 – 2019-08-15 (×3): 5 mg via ORAL
  Filled 2019-08-13 (×5): qty 1

## 2019-08-13 MED ORDER — SODIUM CHLORIDE 0.9 % IV SOLN
INTRAVENOUS | Status: DC
  Administered 2019-08-13 – 2019-08-15 (×4): via INTRAVENOUS

## 2019-08-13 MED ORDER — SODIUM CHLORIDE 0.9% FLUSH: 3.0000 mL | Freq: Once | INTRAVENOUS | Status: DC

## 2019-08-13 MED ORDER — SODIUM CHLORIDE 0.9 % IV SOLN
Freq: Once | INTRAVENOUS | Status: AC
  Administered 2019-08-13: 11:00:00 via INTRAVENOUS

## 2019-08-13 MED ORDER — NITROGLYCERIN 0.4 MG SL SUBL: 0.4000 mg | SUBLINGUAL_TABLET | SUBLINGUAL | Status: DC | PRN

## 2019-08-13 MED ORDER — INSULIN ASPART 100 UNIT/ML ~~LOC~~ SOLN
0.0000 [IU] | SUBCUTANEOUS | Status: DC
  Administered 2019-08-13: 2 [IU] via SUBCUTANEOUS
  Administered 2019-08-13: 9 [IU] via SUBCUTANEOUS
  Administered 2019-08-14: 1 [IU] via SUBCUTANEOUS
  Administered 2019-08-14: 3 [IU] via SUBCUTANEOUS
  Administered 2019-08-14 – 2019-08-15 (×3): 1 [IU] via SUBCUTANEOUS
  Filled 2019-08-13 (×6): qty 1

## 2019-08-13 MED ORDER — APIXABAN 5 MG PO TABS
5.0000 mg | ORAL_TABLET | Freq: Two times a day (BID) | ORAL | Status: DC
  Administered 2019-08-14 – 2019-08-15 (×4): 5 mg via ORAL
  Filled 2019-08-13 (×5): qty 1

## 2019-08-13 MED ORDER — SENNA 8.6 MG PO TABS
1.0000 | ORAL_TABLET | Freq: Every day | ORAL | Status: DC | PRN
  Administered 2019-08-14: 8.6 mg via ORAL
  Filled 2019-08-13 (×3): qty 1

## 2019-08-13 MED ORDER — INSULIN DETEMIR 100 UNIT/ML ~~LOC~~ SOLN
20.0000 [IU] | Freq: Two times a day (BID) | SUBCUTANEOUS | Status: DC
  Administered 2019-08-14 – 2019-08-15 (×4): 20 [IU] via SUBCUTANEOUS
  Filled 2019-08-13 (×6): qty 0.2

## 2019-08-13 NOTE — ED Notes (Signed)
Pt was unable to stand - unsteady on her feet and feeling like she was going to fall. Once sitting down - bp was 98/ 72 hr 86

## 2019-08-13 NOTE — ED Notes (Signed)
EDP to bedside.

## 2019-08-13 NOTE — ED Notes (Signed)
Patient transported to CT 

## 2019-08-13 NOTE — ED Provider Notes (Signed)
[facility] Emergency Department Provider Note       Time seen: ----------------------------------------- 9:58 AM on 08/13/2019 -----------------------------------------   I have reviewed the triage vital signs and the nursing notes.  HISTORY   Chief Complaint Loss of Consciousness    HPI [name] is a 63 y.o. female with a history of encephalopathy, colitis, degenerative disc disease, diverticulitis, hypertension, hypothyroidism, pancreatitis, atrial fibrillation, TIA, diabetes who presents to the ED for multiple falls.  Patient states she is fallen many times over the past 18 hours or so.  She is not sure why, she denies fevers, chills, chest pain, shortness of breath, vomiting or diarrhea.  Denies any recent change in her medicines.  She was found to be hypotensive on arrival.  She is complaining of pain to her head, left face, neck, low back, hips and both feet.  Patient states she has not been able to walk since she fell.  Past Medical History:  Diagnosis Date  . Acute encephalopathy 05/22/2018  . Allergy   . Anxiety   . Ascites   . C. difficile colitis 07/10/2015  . Cancer (HCC)    HX OF CANCER OF UTERUS   . Cirrhosis of liver not due to alcohol ([place]) 2016  . Degenerative disk disease   . Diverticulitis   . Gastroparesis   . GERD (gastroesophageal reflux disease)   . History of hiatal hernia   . Hypertension   . Hypothyroid   . Hypothyroidism due to amiodarone   . Ileus ([place]) 08/01/2015  . Intussusception intestine ([place]) 05/2015  . Orthostatic hypotension   . PAF (paroxysmal atrial fibrillation) ([place]) 03/2015   a. new onset 03/2015 in setting of intractable N/V; b. on Eliquis 5 mg bid; c. CHADSVASc 4 (DM, TIA x 2, female)  . Pancreatitis   . Pneumonia 11/14/2015  . Right ureteral stone 07/14/2016  . Sepsis ([place]) 12/15/2017  . Sick sinus syndrome ([place])   . Stomach ulcer   . Stroke [facility])    with minimal left sided weakness  . Syncope  01/2015  . Syncope due to orthostatic hypotension 05/18/2015  . Tachyarrhythmia 01/10/2016  . TIA (transient ischemic attack) 02/2015  . Type 1 diabetes ([place])    on levemir  . UTERINE CANCER, HX OF 03/27/2007   Qualifier: Diagnosis of  By: Maxie Better FNP, Rosalita Levan   . UTI (urinary tract infection) 05/22/2018    Patient Active Problem List   Diagnosis Date Noted  . Weakness of both lower extremities 07/31/2019  . Altered mental status 07/14/2019  . GAD (generalized anxiety disorder) 06/11/2019  . Poor memory 06/11/2019  . Constipation 05/25/2019  . Acute medial meniscus tear of left knee 05/25/2019  . AKI (acute kidney injury) ([place]) 05/16/2019  . Closed fracture of left distal fibula 05/16/2019  . Sinus tachycardia 05/05/2019  . Congestive dilated cardiomyopathy ([place]) 04/14/2019  . CAD (coronary artery disease) 04/10/2019  . Closed fracture of lateral malleolus 04/10/2019  . Lumbar facet syndrome (Bilateral) (R>L) 03/09/2019  . Long term (current) use of anticoagulants 03/09/2019  . Unspecified cirrhosis of liver ([place]) 02/11/2019  . Hypoalbuminemia 02/11/2019  . Edema due to hypoalbuminemia 02/11/2019  . Lower extremity edema 01/26/2019  . Prolonged QT interval 12/17/2018  . Hypomagnesemia 12/17/2018  . Uncontrolled type 2 diabetes mellitus ([place]) 12/17/2018  . H/O TIA (transient ischemic attack) and stroke 12/17/2018  . Personal history of surgery to heart and great vessels, presenting hazards to health 12/17/2018  . DDD (degenerative disc  disease), lumbar 12/17/2018  . Lumbar intervertebral disc displacement 12/17/2018  . Chronic musculoskeletal pain 12/17/2018  . Chronic generalized pain 12/17/2018  . Hyponatremia 12/17/2018  . Fibromyalgia syndrome 12/17/2018  . Other intervertebral disc displacement, lumbar region 12/17/2018  . Vitamin D deficiency 11/26/2018  . Elevated C-reactive protein (CRP) 11/26/2018  . Elevated sed rate 11/26/2018  . Chronic low back pain (Primary  area of Pain) (Bilateral) (L>R) w/ sciatica (Bilateral) 11/25/2018  . Chronic lower extremity pain (Secondary Area of Pain) (Bilateral) (L>R) 11/25/2018  . Chronic neck pain 11/25/2018  . Pharmacologic therapy 11/25/2018  . Disorder of skeletal system 11/25/2018  . Problems influencing health status 11/25/2018  . Renal mass 10/06/2018  . Chronic intermittent abdominal pain 09/25/2018  . Unspecified abdominal pain 09/25/2018  . Recurrent falls 09/12/2018  . Orthostatic hypotension 08/18/2018  . Normochromic normocytic anemia 08/18/2018  . CKD (chronic kidney disease), stage III 08/18/2018  . History of hypothyroidism 08/14/2018  . Medical non-compliance 08/14/2018  . Diabetic ketoacidosis ([place]) 05/22/2018  . UTI (urinary tract infection) 05/22/2018  . NSTEMI (non-ST elevated myocardial infarction) ([place]) 01/11/2016  . Tachy-brady syndrome ([place]) 12/28/2015  . PAF (paroxysmal atrial fibrillation) ([place]) 11/24/2015  . Type 2 diabetes mellitus with neurologic complication ([place]) 95/32/0233  . S/P cholecystectomy 11/23/2015  . Narcotic abuse ([place]) 11/22/2015  . Chronic superficial gastritis without bleeding 11/22/2015  . History of Clostridium difficile 11/22/2015  . History of TIA (transient ischemic attack) 11/22/2015  . Mixed hyperlipidemia 11/22/2015  . Diverticulosis 11/22/2015  . History of uterine cancer 11/22/2015  . Hypothyroidism, unspecified 11/22/2015  . Intractable cyclical vomiting with nausea 11/22/2015  . Narcotic withdrawal ([place]) 11/11/2015  . Insulin dependent type 2 diabetes mellitus ([place]) 08/31/2015  . Hyperlipidemia 08/31/2015  . Cryptogenic cirrhosis ([place]) 07/10/2015  . Atrial fibrillation ([place]) 07/10/2015  . Malnutrition of moderate degree 07/04/2015  . Symptomatic bradycardia 05/27/2015  . Chronic anemia 05/19/2015  . Thrombocytopenia ([place]) 05/19/2015  . Hypokalemia 04/06/2015  . Hyperlipidemia with target LDL less than 100 04/06/2015  . Syncope 03/11/2015  .  CVA (cerebral vascular accident) ([place]) 02/15/2015  . Essential hypertension 01/12/2015  . Chronically on opiate therapy 01/12/2015  . S/P Nissen fundoplication (without gastrostomy tube) procedure 05/11/2014  . Dysphagia 04/19/2014  . DEPRESSION/ANXIETY 06/27/2007  . Myofascial pain syndrome 06/27/2007  . Chronic pain syndrome 03/28/2007  . GERD 03/27/2007  . Diverticulosis of large intestine without perforation or abscess without bleeding 03/27/2007  . Proteinuria 03/27/2007    Past Surgical History:  Procedure Laterality Date  . ABDOMINAL HYSTERECTOMY    . CARDIAC CATHETERIZATION N/A 01/12/2016   Procedure: Left Heart Cath and Coronary Angiography;  Surgeon: Wellington Hampshire, MD;  Location: [place] [facility];  Service: Cardiovascular;  Laterality: N/A;  . CHOLECYSTECTOMY    . CYSTOSCOPY/URETEROSCOPY/HOLMIUM LASER Right 07/14/2016   Procedure: CYSTOSCOPY/URETEROSCOPY/HOLMIUM LASER;  Surgeon: Alexis Frock, MD;  Location: [facility] ORS;  Service: Urology;  Laterality: Right;  . ESOPHAGOGASTRODUODENOSCOPY N/A 04/04/2015   Procedure: ESOPHAGOGASTRODUODENOSCOPY (EGD);  Surgeon: Hulen Luster, MD;  Location: [facility] ENDOSCOPY;  Service: Endoscopy;  Laterality: N/A;  . ESOPHAGOGASTRODUODENOSCOPY N/A 12/28/2017   Procedure: ESOPHAGOGASTRODUODENOSCOPY (EGD);  Surgeon: Lin Landsman, MD;  Location[facility] ENDOSCOPY;  Service: Gastroenterology;  Laterality: N/A;  . ESOPHAGOGASTRODUODENOSCOPY (EGD) WITH PROPOFOL N/A 01/18/2016   Procedure: ESOPHAGOGASTRODUODENOSCOPY (EGD) WITH PROPOFOL;  Surgeon: Lucilla Lame, MD;  Location: [facility] ENDOSCOPY;  Service: Endoscopy;  Laterality: N/A;  . FLEXIBLE SIGMOIDOSCOPY N/A 01/18/2016   Procedure: FLEXIBLE SIGMOIDOSCOPY;  Surgeon: Lucilla Lame,  MD;  Location: [facility] ENDOSCOPY;  Service: Endoscopy;  Laterality: N/A;  . HERNIA REPAIR      Allergies Aspirin, Codeine sulfate, Erythromycin, Prednisone, Rosiglitazone maleate, and Tetanus-diphtheria toxoids td  Social  History Social History   Tobacco Use  . Smoking status: Former Smoker    Types: Cigarettes  . Smokeless tobacco: Never Used  . Tobacco comment: 25 years ago and only smoked occasionally  Substance Use Topics  . Alcohol use: No  . Drug use: No   Review of Systems Constitutional: Negative for fever. Cardiovascular: Negative for chest pain. Respiratory: Negative for shortness of breath. Gastrointestinal: Negative for abdominal pain, vomiting and diarrhea. Musculoskeletal: Positive for low back, bilateral foot pain, positive for neck and back pain Skin: Negative for rash. Neurological: Positive for headache  All systems negative/normal/unremarkable except as stated in the HPI  ____________________________________________   PHYSICAL EXAM:  VITAL SIGNS: ED Triage Vitals [08/13/19 0952]  Enc Vitals Group     BP (!) 78/41     Pulse Rate (!) 101     Resp 18     Temp 98.2 F (36.8 C)     Temp Source Oral     SpO2 100 %     Weight 157 lb (71.2 kg)     Height 5' 3"  (1.6 m)     Head Circumference      Peak Flow      Pain Score 9     Pain Loc      Pain Edu?      Excl. in [place]?    Constitutional: Alert and oriented.  No acute distress Eyes: Conjunctivae are normal. Normal extraocular movements. ENT      Head: Normocephalic and atraumatic.      Nose: No congestion/rhinnorhea.      Mouth/Throat: Mucous membranes are moist.      Neck: No stridor. Cardiovascular: Normal rate, regular rhythm. No murmurs, rubs, or gallops. Respiratory: Normal respiratory effort without tachypnea nor retractions. Breath sounds are clear and equal bilaterally. No wheezes/rales/rhonchi. Gastrointestinal: Soft and nontender. Normal bowel sounds Musculoskeletal: Tenderness across the lumbar spine, over the dorsum of both feet, left facial and cervical spine Neurologic:  Normal speech and language. No gross focal neurologic deficits are appreciated.  Skin:  Skin is warm, dry and intact. No rash  noted. Psychiatric: Mood and affect are normal. Speech and behavior are normal.  ____________________________________________  EKG: Interpreted by me.  Sinus rhythm with rate of 77 bpm, normal PR interval, long QT, normal axis  ____________________________________________  ED COURSE:  As part of my medical decision making, I reviewed the following data within the [place] History obtained from family if available, nursing notes, old chart and ekg, as well as notes from prior ED visits. Patient presented for frequent falls, we will assess with labs and imaging as indicated at this time.   Procedures  JARELYN BAMBACH was evaluated in Emergency Department on 08/13/2019 for the symptoms described in the history of present illness. She was evaluated in the context of the global COVID-19 pandemic, which necessitated consideration that the patient might be at risk for infection with the SARS-CoV-2 virus that causes COVID-19. Institutional protocols and algorithms that pertain to the evaluation of patients at risk for COVID-19 are in a state of rapid change based on information released by regulatory bodies including the CDC and federal and state organizations. These policies and algorithms were followed during the patient's care in the ED.  ____________________________________________   LABS (  pertinent positives/negatives)  Labs Reviewed  CBC - Abnormal; Notable for the following components:      Result Value   Hemoglobin 11.2 (*)    HCT 35.8 (*)    RDW 15.9 (*)    All other components within normal limits  BASIC METABOLIC PANEL - Abnormal; Notable for the following components:   Sodium 134 (*)    Potassium 3.2 (*)    CO2 19 (*)    Glucose, Bld 273 (*)    Creatinine, Ser 2.56 (*)    Calcium 8.1 (*)    GFR calc non Af Amer 19 (*)    GFR calc Af Amer 22 (*)    All other components within normal limits  URINALYSIS, COMPLETE (UACMP) WITH MICROSCOPIC  CBG MONITORING, ED   TROPONIN I (HIGH SENSITIVITY)  TROPONIN I (HIGH SENSITIVITY)    RADIOLOGY Images were viewed by me  CT head, maxillofacial, C-spine Did not reveal any acute process Lumbar spine x-ray, bilateral foot x-rays, pelvis x-ray Are unremarkable ____________________________________________   DIFFERENTIAL DIAGNOSIS   Dehydration, electrolyte abnormality, accidental overdose, anemia, MI, CVA, fracture, subdural  FINAL ASSESSMENT AND PLAN  Fall, minor head injury, hypotension, acute kidney injury   Plan: The patient had presented for multiple recent falls. Patient's labs revealed acute kidney injury with a creatinine of 2.56 which is about double her normal creatinine. Patient's imaging did not reveal any acute process.  We gave her a liter of fluids and she remained orthostatic.  This is likely multifactorial with dehydration and blood pressure medication related.  She will need to be observed for improved renal function on continued fluids, she will also need to have her metoprolol held.   Laurence Aly, MD    Note: This note was generated in part or whole with voice recognition software. Voice recognition is usually quite accurate but there are transcription errors that can and very often do occur. I apologize for any typographical errors that were not detected and corrected.     [name], MD 08/13/19 1331

## 2019-08-13 NOTE — ED Notes (Signed)
EMS report :  Sudden lost of balance , fell backwards , struck her head and lower back , reports she is on a blood thinner. c-collar inplace

## 2019-08-13 NOTE — ED Triage Notes (Signed)
EMS pt from home , loss of balance , more frequent x4-5 days , today loss her balance fell backward , struck her head/ lower back. On a blood thinner , c-collar intact . Alert and oriented

## 2019-08-13 NOTE — H&P (Signed)
History and Physical    [name] ZGY:[id number] DOB: 11-09-1955 DOA: 08/13/2019  Referring MD/NP/PA:   PCP: [name], NP   Patient coming from:  The patient is coming from home.  At baseline, pt is independent for most of ADL.        Chief Complaint: Multiple fall  HPI: [name] is a 63 y.o. female with medical history significant of hypertension, diabetes mellitus, stroke, GERD, hypothyroidism, depression, anxiety, uterine cancer, stomach ulcer, PAF on Eliquis, diverticulitis, C. difficile colitis, liver cirrhosis noted due to alcohol abuse, ascites, chronic pain syndrome, CKD stage III, orthostatic hypotension, who presents with multiple fall.   Patient states that she has had multiple fall due to weakness in both legs and poor balance in the past several days.  No loss of consciousness.  She states that she injured her head, lower back, neck, both feet, causing pain all over.  Patient has mild intermittent cough and mild shortness breath, denies chest pain.  No fever or chills.  Patient is constipated, but no nausea, vomiting, diarrhea or abdominal pain.  Patient states she has dysuria increased urinary frequency sometimes.  No facial droop or slurred speech.  No unilateral tingling to extremities. Her initial blood pressure 72/41, which improved to 138/70 after given 2 L normal saline bolus in ED.  ED Course: pt was found to have positive orthostatic vital signs in ED, WBC 8.2, troponin 12, pending COVID-19 test, potassium 3.2, worsening renal function. Pending UA. Temperature normal, heart rate 101 --> 71, oxygen saturation 97% on room air. Negative CT of head, CT of C-spine and CT of maxillofacial image.  X-ray of left foot, right foot, lumbar spine and pelvis are all negative for bony fracture.  Patient is placed on MedSurg [place] for observation.  Review of Systems:   General: no fevers, chills, no body weight gain, has fatigue HEENT: no blurry vision, hearing  changes or sore throat Respiratory: has mild dyspnea, coughing, no wheezing CV: no chest pain, no palpitations GI: no nausea, vomiting, abdominal pain, diarrhea, has constipation GU: no dysuria, burning on urination, increased urinary frequency, hematuria  Ext: no leg edema Neuro: no unilateral weakness, numbness, or tingling, no vision change or hearing loss. Has multiple fall. Skin: no rash, no skin tear. MSK: No muscle spasm, no deformity, no limitation of range of movement in spin Heme: No easy bruising.  Travel history: No recent long distant travel.  Allergy:  Allergies  Allergen Reactions   Aspirin Rash   Codeine Sulfate Rash   Erythromycin Rash    Reaction:  Fever    Prednisone Swelling   Rosiglitazone Maleate Swelling   Tetanus-Diphtheria Toxoids Td Rash and Other (See Comments)    Reaction:  Fever     Past Medical History:  Diagnosis Date   Acute encephalopathy 05/22/2018   Allergy    Anxiety    Ascites    C. difficile colitis 07/10/2015   Cancer ([place])    HX OF CANCER OF UTERUS    Cirrhosis of liver not due to alcohol ([place]) 2016   Degenerative disk disease    Diverticulitis    Gastroparesis    GERD (gastroesophageal reflux disease)    History of hiatal hernia    Hypertension    Hypothyroid    Hypothyroidism due to amiodarone    Ileus ([place]) 08/01/2015   Intussusception intestine ([place]) 05/2015   Orthostatic hypotension    PAF (paroxysmal atrial fibrillation) ([place]) 03/2015   a. new onset  03/2015 in setting of intractable N/V; b. on Eliquis 5 mg bid; c. CHADSVASc 4 (DM, TIA x 2, female)   Pancreatitis    Pneumonia 11/14/2015   Right ureteral stone 07/14/2016   Sepsis ([place]) 12/15/2017   Sick sinus syndrome ([place])    Stomach ulcer    Stroke ([place])    with minimal left sided weakness   Syncope 01/2015   Syncope due to orthostatic hypotension 05/18/2015   Tachyarrhythmia 01/10/2016   TIA (transient ischemic attack) 02/2015   Type 1  diabetes ([place])    on levemir   UTERINE CANCER, HX OF 03/27/2007   Qualifier: Diagnosis of  By: Maxie Better FNP, Rosalita Levan    UTI (urinary tract infection) 05/22/2018    Past Surgical History:  Procedure Laterality Date   ABDOMINAL HYSTERECTOMY     CARDIAC CATHETERIZATION N/A 01/12/2016   Procedure: Left Heart Cath and Coronary Angiography;  Surgeon: Wellington Hampshire, MD;  Location: [facility];  Service: Cardiovascular;  Laterality: N/A;   CHOLECYSTECTOMY     CYSTOSCOPY/URETEROSCOPY/HOLMIUM LASER Right 07/14/2016   Procedure: CYSTOSCOPY/URETEROSCOPY/HOLMIUM LASER;  Surgeon: Alexis Frock, MD;  Location: [facility];  Service: Urology;  Laterality: Right;   ESOPHAGOGASTRODUODENOSCOPY N/A 04/04/2015   Procedure: ESOPHAGOGASTRODUODENOSCOPY (EGD);  Surgeon: Hulen Luster, MD;  Location[facility] END[facility]COPY;  Service: Endoscopy;  Laterality: N/A;   ESOPHAGOGASTRODUODENOSCOPY N/A 12/28/2017   Procedure: ESOPHAGOGASTRODUODENOSCOPY (EGD);  Surgeon: Lin Landsman, MD;  Location: [facility] ENDOSCOPY;  Service: Gastroenterology;  Laterality: N/A;   ESOPHAGOGASTRODUODENOSCOPY (EGD) WITH PROPOFOL N/A 01/18/2016   Procedure: ESOPHAGOGASTRODUODENOSCOPY (EGD) WITH PROPOFOL;  Surgeon: Lucilla Lame, MD;  Location: [facility] ENDOSCOPY;  Service: Endoscopy;  Laterality: N/A;   FLEXIBLE SIGMOIDOSCOPY N/A 01/18/2016   Procedure: FLEXIBLE SIGMOIDOSCOPY;  Surgeon: Lucilla Lame, MD;  Location: [facility] ENDOSCOPY;  Service: Endoscopy;  Laterality: N/A;   HERNIA REPAIR      Social History:  reports that she has quit smoking. Her smoking use included cigarettes. She has never used smokeless tobacco. She reports that she does not drink alcohol or use drugs.  Family History:  Family History  Problem Relation Age of Onset   Hypertension Mother    CAD Sister    Heart attack Sister        Deceased 24-Nov-2014   CAD Brother      Prior to Admission medications   Medication Sig Start Date End Date Taking? Authorizing  Provider  albuterol (PROVENTIL HFA;VENTOLIN HFA) 108 (90 Base) MCG/ACT inhaler Inhale 2 puffs into the lungs every 6 (six) hours as needed for wheezing or shortness of breath. 11/27/18   Epifanio Lesches, MD  apixaban (ELIQUIS) 5 MG TABS tablet Take 5 mg by mouth 2 (two) times daily.    [provider]  cyclobenzaprine (FLEXERIL) 10 MG tablet Take 1 tablet (10 mg total) by mouth 3 (three) times daily as needed for muscle spasms. Must last 30 days. 08/10/19 02/06/20  Milinda Pointer, MD  HYDROcodone-acetaminophen (NORCO/VICODIN) 5-325 MG tablet Take 1 tablet by mouth every 6 (six) hours as needed for moderate pain. 07/24/19   Nena Polio, MD  Insulin Detemir (LEVEMIR) 100 UNIT/ML Pen Inject 30 units in the morning and 30 units at bedtime. Patient taking differently: Inject 25 units in the morning and 30 units at bedtime. 02/09/19   [name], NP  metoprolol tartrate (LOPRESSOR) 25 MG tablet Take 1 tablet (25 mg total) by mouth 2 (two) times daily. 07/29/19 10/27/19  Minna Merritts, MD  midodrine (PROAMATINE) 5 MG tablet  Take 1 tablet (5 mg total) by mouth 3 (three) times daily with meals. 07/29/19   Minna Merritts, MD  nitroGLYCERIN (NITROSTAT) 0.3 MG SL tablet Place 1 tablet (0.3 mg total) under the tongue every 5 (five) minutes as needed for chest pain. 07/24/19 07/23/20  Nena Polio, MD  oxyCODONE (OXY IR/ROXICODONE) 5 MG immediate release tablet Take 1 tablet (5 mg total) by mouth every 4 (four) hours as needed for severe pain. Must last 30 days. Max: 6/day 07/13/19 08/12/19  Milinda Pointer, MD  oxyCODONE (OXY IR/ROXICODONE) 5 MG immediate release tablet Take 1 tablet (5 mg total) by mouth every 4 (four) hours as needed for severe pain. Must last 30 days. Max: 6/day 08/12/19 09/11/19  Milinda Pointer, MD  oxyCODONE (OXY IR/ROXICODONE) 5 MG immediate release tablet Take 1 tablet (5 mg total) by mouth every 4 (four) hours as needed for severe pain. Must last 30 days.  Max: 6/day 09/11/19 10/11/19  Milinda Pointer, MD  sertraline (ZOLOFT) 25 MG tablet TAKE 1 TABLET AT BEDTIME FOR ANXIETY 07/31/19   [name], NP  sucralfate (CARAFATE) 1 g tablet Take 1 tablet (1 g total) by mouth 4 (four) times daily -  with meals and at bedtime. 07/18/19   Swayze, Ava, DO    Physical Exam: Vitals:   08/13/19 1017 08/13/19 1145 08/13/19 1428 08/13/19 1652  BP: (!) 89/51 138/70 (!) 87/49 (!) 98/51  Pulse:  77 87 85  Resp: 17 16 16 16   Temp:      TempSrc:      SpO2: 97% 97%  97%  Weight:      Height:       General: Not in acute distress.  Dry mucus membrane HEENT:       Eyes: PERRL, EOMI, no scleral icterus.       ENT: No discharge from the ears and nose, no pharynx injection, no tonsillar enlargement.        Neck: No JVD, no bruit, no mass felt. Heme: No neck lymph node enlargement. Cardiac: S1/S2, RRR, No murmurs, No gallops or rubs. Respiratory: No rales, wheezing, rhonchi or rubs. GI: Soft, nondistended, nontender, no rebound pain, no organomegaly, BS present. GU: No hematuria Ext: No pitting leg edema bilaterally. 2+DP/PT pulse bilaterally. Musculoskeletal: No joint deformities, No joint redness or warmth, no limitation of ROM in spin. Skin: No rashes.  Neuro: Alert, oriented X3, cranial nerves II-XII grossly intact, moves all extremities. Psych: Patient is not psychotic, no suicidal or hemocidal ideation.  Labs on Admission: I have personally reviewed following labs and imaging studies  CBC: Recent Labs  Lab 08/13/19 1059  WBC 8.2  HGB 11.2*  HCT 35.8*  MCV 85.6  PLT 638   Basic Metabolic Panel: Recent Labs  Lab 08/13/19 1211  NA 134*  K 3.2*  CL 106  CO2 19*  GLUCOSE 273*  BUN 20  CREATININE 2.56*  CALCIUM 8.1*   GFR: Estimated Creatinine Clearance: 20.7 mL/min (A) (by C-G formula based on SCr of 2.56 mg/dL (H)). Liver Function Tests: No results for input(s): AST, ALT, ALKPHOS, BILITOT, PROT, ALBUMIN in the last 168  hours. No results for input(s): LIPASE, AMYLASE in the last 168 hours. No results for input(s): AMMONIA in the last 168 hours. Coagulation Profile: No results for input(s): INR, PROTIME in the last 168 hours. Cardiac Enzymes: No results for input(s): CKTOTAL, CKMB, CKMBINDEX, TROPONINI in the last 168 hours. BNP (last 3 results) No results for input(s): PROBNP in the  last 8760 hours. HbA1C: No results for input(s): HGBA1C in the last 72 hours. CBG: No results for input(s): GLUCAP in the last 168 hours. Lipid Profile: No results for input(s): CHOL, HDL, LDLCALC, TRIG, CHOLHDL, LDLDIRECT in the last 72 hours. Thyroid Function Tests: No results for input(s): TSH, T4TOTAL, FREET4, T3FREE, THYROIDAB in the last 72 hours. Anemia Panel: No results for input(s): VITAMINB12, FOLATE, FERRITIN, TIBC, IRON, RETICCTPCT in the last 72 hours. Urine analysis:    Component Value Date/Time   COLORURINE YELLOW (A) 07/15/2019 1402   APPEARANCEUR TURBID (A) 07/15/2019 1402   APPEARANCEUR Clear 10/08/2014 0144   LABSPEC 1.018 07/15/2019 1402   LABSPEC 1.033 10/08/2014 0144   PHURINE 5.0 07/15/2019 1402   GLUCOSEU 50 (A) 07/15/2019 1402   GLUCOSEU >=500 10/08/2014 0144   HGBUR MODERATE (A) 07/15/2019 1402   BILIRUBINUR NEGATIVE 07/15/2019 1402   BILIRUBINUR Negative 11/26/2018 1431   BILIRUBINUR Negative 10/08/2014 0144   KETONESUR NEGATIVE 07/15/2019 1402   PROTEINUR 100 (A) 07/15/2019 1402   UROBILINOGEN 0.2 11/26/2018 1431   UROBILINOGEN 0.2 05/27/2015 1936   NITRITE NEGATIVE 07/15/2019 1402   LEUKOCYTESUR MODERATE (A) 07/15/2019 1402   LEUKOCYTESUR Negative 10/08/2014 0144   Sepsis Labs: @LABRCNTIP (procalcitonin:4,lacticidven:4) )No results found for this or any previous visit (from the past 240 hour(s)).   Radiological Exams on Admission: Dg Lumbar Spine 2-3 Views  Result Date: 08/13/2019 CLINICAL DATA:  Low back pain secondary to multiple recent falls including today. EXAM: LUMBAR  SPINE - 2-3 VIEW COMPARISON:  Radiographs dated 12/16/2014 FINDINGS: There is a chronic lumbar scoliosis with convexity to the left centered at L2, unchanged. Lateral alignment is normal. No disc space narrowing. No fractures. Facet arthritis at L5-S1 on the left, unchanged. Right ureteral stent in place, unchanged. Aortic atherosclerosis. IMPRESSION: 1. No acute abnormality. 2.  Aortic Atherosclerosis (ICD10-I70.0). Electronically Signed   By: Lorriane Shire M.D.   On: 08/13/2019 10:59   Dg Pelvis 1-2 Views  Result Date: 08/13/2019 CLINICAL DATA:  Lower back pain, hip pain and BILATERAL foot pain, fell at home EXAM: PELVIS - 1-2 VIEW COMPARISON:  07/14/2019 FINDINGS: Mid and distal aspects of a RIGHT ureteral stent appear unchanged. Diffuse osseous demineralization. Hip and SI joint spaces preserved. No fracture, dislocation, or bone destruction. IMPRESSION: No acute abnormalities. Electronically Signed   By: Lavonia Dana M.D.   On: 08/13/2019 10:59   Ct Head Wo Contrast  Result Date: 08/13/2019 CLINICAL DATA:  Fall, head strike EXAM: CT HEAD WITHOUT CONTRAST TECHNIQUE: Contiguous axial images were obtained from the base of the skull through the vertex without intravenous contrast. COMPARISON:  July 14, 2019 FINDINGS: Brain: There is no acute intracranial hemorrhage, mass-effect, or edema. Gray-white differentiation is preserved. There is no extra-axial fluid collection. Ventricles and sulci are stable in size and configuration. Vascular: There is atherosclerotic calcification at the skull base. Skull: Calvarium is unremarkable. Sinuses/Orbits: No acute finding. Other: None. IMPRESSION: No evidence of acute intracranial injury. Electronically Signed   By: Macy Mis M.D.   On: 08/13/2019 10:47   Ct Cervical Spine Wo Contrast  Result Date: 08/13/2019 CLINICAL DATA:  Fall EXAM: CT CERVICAL SPINE WITHOUT CONTRAST TECHNIQUE: Multidetector CT imaging of the cervical spine was performed without  intravenous contrast. Multiplanar CT image reconstructions were also generated. COMPARISON:  August 18, 2018 FINDINGS: Alignment: Anteroposterior alignment is maintained. Skull base and vertebrae: There is no acute fracture. Vertebral body heights are preserved. Soft tissues and spinal canal: No prevertebral fluid or swelling. No visible  canal hematoma. Disc levels: Mild degenerative changes are present. There is no significant stenosis. Upper chest: Negative. Other: None. IMPRESSION: No acute cervical spine fracture. Electronically Signed   By: Macy Mis M.D.   On: 08/13/2019 10:54   Dg Foot Complete Left  Result Date: 08/13/2019 CLINICAL DATA:  BILATERAL foot pain post fall EXAM: LEFT FOOT - COMPLETE 3+ VIEW COMPARISON:  None FINDINGS: Osseous demineralization. Degenerative changes at IP joint great toe. Remaining joint spaces preserved. No acute fracture, dislocation, or bone destruction. Tiny ossicle at the base of the middle phalanx LEFT fourth toe medially, appears corticated/old. Soft tissues unremarkable. IMPRESSION: Osseous demineralization with degenerative changes at IP joint great toe. No acute abnormalities identified. Electronically Signed   By: Lavonia Dana M.D.   On: 08/13/2019 11:05   Dg Foot Complete Right  Result Date: 08/13/2019 CLINICAL DATA:  BILATERAL foot pain post fall EXAM: RIGHT FOOT COMPLETE - 3+ VIEW COMPARISON:  None FINDINGS: Osseous demineralization. Degenerative changes at IP joint great toe. Remaining joint spaces preserved. Non fused accessory ossicle at medial margin of mid foot. No acute fracture, dislocation, or bone destruction. Small plantar calcaneal spur. IMPRESSION: No acute osseous abnormalities. Electronically Signed   By: Lavonia Dana M.D.   On: 08/13/2019 11:07   Ct Maxillofacial Wo Contrast  Result Date: 08/13/2019 CLINICAL DATA:  Fall EXAM: CT MAXILLOFACIAL WITHOUT CONTRAST TECHNIQUE: Multidetector CT imaging of the maxillofacial structures was  performed. Multiplanar CT image reconstructions were also generated. COMPARISON:  None. FINDINGS: Osseous: No acute fracture. Temporomandibular joints are unremarkable. Orbits: No intraorbital hematoma. Sinuses: Minor mucosal thickening. Soft tissues: Negative. Limited intracranial: Dictated separately. IMPRESSION: No acute facial fracture. Electronically Signed   By: Macy Mis M.D.   On: 08/13/2019 10:50     EKG: Independently reviewed.  Sinus rhythm, QTC 525, LAE, poor R wave progression, Q waves in lead III, nonspecific T wave change.  Assessment/Plan Principal Problem:   Fall Active Problems:   DEPRESSION/ANXIETY   Chronic pain syndrome   CVA (cerebral vascular accident) ([place])   Atrial fibrillation ([place])   Hypotension   Orthostatic hypotension   Acute renal failure superimposed on stage 3a chronic kidney disease ([place])   Frequent falls   Type II diabetes mellitus with renal manifestations ([place])   Cirrhosis of liver not due to alcohol [facility])   Fall: Patient has had multiple fall.  Possibly due to hypotension and orthostatic hypotension. Pt is taking midodrine at home.  Her hypotension is likely due to dehydration.  Blood pressure responded to IV fluid quickly. Pt had negative CT of head, CT of C-spine and CT of maxillofacial image.  X-ray of left foot, right foot, lumbar spine and pelvis are all negative for bony fracture.   -place on med-surg bed for obs -IVF: 2L NS and then 75 cc/h -PT/OT  Chronic pain syndrome: -continue home prn oxycodone  CVA (cerebral vascular accident) ([place]): -on Eliquis  Atrial fibrillation ([place]): -continue Eliquis -hold metoprolol due to hypotension.  Acute renal failure superimposed on stage 3a chronic kidney disease ([place]):  Baseline Cre is 1.3  On 07/31/19, pt's Cre is 2.56 and BUN 20 on admission. Likely due to dehydration. ATN is also possible due to hypotension. - IVF as above - Follow up renal function by BMP - Avoid using renal toxic  medications, hypotension and contrast dye (or carefully use)  Type II diabetes mellitus with renal manifestations ([place]): Last A1c 9.7 on 05/25/19, poorly controled. Patient is taking levemir at home -will decrease  levemir dose from 30 to 20 units bid   -SSI  Cirrhosis of liver not due to alcohol [facility]): No GI symptoms.  Mental status normal. - check ammonia level  Depression and anxiety: Stable, no suicidal or homicidal ideations. -hold zoloft due to QTc prolongation.     DVT ppx: on Eliquis Code Status: Full code Family Communication: None at bed side.  Disposition Plan:  Anticipate discharge back to previous home environment Consults called:  none Admission status: med-surg bed for obs   Date of Service 08/13/2019    Ivor Costa [facility]   If 7PM-7AM, please contact night-coverage [email or web address] Password [facility] 08/13/2019, 4:56 PM

## 2019-08-13 NOTE — ED Notes (Signed)
Pt urinated - but this nurse forgot to give her a sample cup. Noted to be cloudy in the toilet.

## 2019-08-14 ENCOUNTER — Other Ambulatory Visit: Payer: Self-pay

## 2019-08-14 DIAGNOSIS — F329 Major depressive disorder, single episode, unspecified: Secondary | ICD-10-CM | POA: Diagnosis present

## 2019-08-14 DIAGNOSIS — I4891 Unspecified atrial fibrillation: Secondary | ICD-10-CM

## 2019-08-14 DIAGNOSIS — I950 Idiopathic hypotension: Secondary | ICD-10-CM

## 2019-08-14 DIAGNOSIS — N1831 Chronic kidney disease, stage 3a: Secondary | ICD-10-CM

## 2019-08-14 DIAGNOSIS — E039 Hypothyroidism, unspecified: Secondary | ICD-10-CM | POA: Diagnosis present

## 2019-08-14 DIAGNOSIS — K7460 Unspecified cirrhosis of liver: Secondary | ICD-10-CM | POA: Diagnosis present

## 2019-08-14 DIAGNOSIS — Z20828 Contact with and (suspected) exposure to other viral communicable diseases: Secondary | ICD-10-CM | POA: Diagnosis present

## 2019-08-14 DIAGNOSIS — I951 Orthostatic hypotension: Secondary | ICD-10-CM | POA: Diagnosis present

## 2019-08-14 DIAGNOSIS — R21 Rash and other nonspecific skin eruption: Secondary | ICD-10-CM | POA: Diagnosis present

## 2019-08-14 DIAGNOSIS — Z7901 Long term (current) use of anticoagulants: Secondary | ICD-10-CM | POA: Diagnosis not present

## 2019-08-14 DIAGNOSIS — Z8542 Personal history of malignant neoplasm of other parts of uterus: Secondary | ICD-10-CM | POA: Diagnosis not present

## 2019-08-14 DIAGNOSIS — Z90710 Acquired absence of both cervix and uterus: Secondary | ICD-10-CM | POA: Diagnosis not present

## 2019-08-14 DIAGNOSIS — E1122 Type 2 diabetes mellitus with diabetic chronic kidney disease: Secondary | ICD-10-CM | POA: Diagnosis present

## 2019-08-14 DIAGNOSIS — Z8711 Personal history of peptic ulcer disease: Secondary | ICD-10-CM | POA: Diagnosis not present

## 2019-08-14 DIAGNOSIS — K5900 Constipation, unspecified: Secondary | ICD-10-CM | POA: Diagnosis present

## 2019-08-14 DIAGNOSIS — Z9049 Acquired absence of other specified parts of digestive tract: Secondary | ICD-10-CM | POA: Diagnosis not present

## 2019-08-14 DIAGNOSIS — W19XXXA Unspecified fall, initial encounter: Secondary | ICD-10-CM

## 2019-08-14 DIAGNOSIS — N179 Acute kidney failure, unspecified: Secondary | ICD-10-CM

## 2019-08-14 DIAGNOSIS — I480 Paroxysmal atrial fibrillation: Secondary | ICD-10-CM | POA: Diagnosis present

## 2019-08-14 DIAGNOSIS — E860 Dehydration: Secondary | ICD-10-CM | POA: Diagnosis present

## 2019-08-14 DIAGNOSIS — R296 Repeated falls: Secondary | ICD-10-CM | POA: Diagnosis present

## 2019-08-14 DIAGNOSIS — G894 Chronic pain syndrome: Secondary | ICD-10-CM | POA: Diagnosis present

## 2019-08-14 DIAGNOSIS — R000 Tachycardia, unspecified: Secondary | ICD-10-CM | POA: Diagnosis present

## 2019-08-14 DIAGNOSIS — Z8673 Personal history of transient ischemic attack (TIA), and cerebral infarction without residual deficits: Secondary | ICD-10-CM | POA: Diagnosis not present

## 2019-08-14 DIAGNOSIS — I129 Hypertensive chronic kidney disease with stage 1 through stage 4 chronic kidney disease, or unspecified chronic kidney disease: Secondary | ICD-10-CM | POA: Diagnosis present

## 2019-08-14 DIAGNOSIS — F419 Anxiety disorder, unspecified: Secondary | ICD-10-CM | POA: Diagnosis present

## 2019-08-14 DIAGNOSIS — K219 Gastro-esophageal reflux disease without esophagitis: Secondary | ICD-10-CM | POA: Diagnosis present

## 2019-08-14 LAB — GLUCOSE, CAPILLARY
Glucose-Capillary: 143 mg/dL — ABNORMAL HIGH (ref 70–99)
Glucose-Capillary: 137 mg/dL — ABNORMAL HIGH (ref 70–99)
Glucose-Capillary: 138 mg/dL — ABNORMAL HIGH (ref 70–99)
Glucose-Capillary: 232 mg/dL — ABNORMAL HIGH (ref 70–99)
Glucose-Capillary: 182 mg/dL — ABNORMAL HIGH (ref 70–99)

## 2019-08-14 LAB — URINALYSIS, COMPLETE (UACMP) WITH MICROSCOPIC
Bacteria, UA: NONE SEEN
Bilirubin Urine: NEGATIVE
Glucose, UA: 150 mg/dL — AB
Ketones, ur: NEGATIVE mg/dL
Nitrite: NEGATIVE
Protein, ur: 100 mg/dL — AB
Specific Gravity, Urine: 1.012 (ref 1.005–1.030)
WBC, UA: >50 WBC/hpf — ABNORMAL HIGH (ref 0–5)
pH: 6 (ref 5.0–8.0)

## 2019-08-14 LAB — BASIC METABOLIC PANEL
Anion gap: 9 (ref 5–15)
BUN: 23 mg/dL (ref 8–23)
CO2: 19 mmol/L — ABNORMAL LOW (ref 22–32)
Calcium: 8.1 mg/dL — ABNORMAL LOW (ref 8.9–10.3)
Chloride: 109 mmol/L (ref 98–111)
Creatinine, Ser: 2.02 mg/dL — ABNORMAL HIGH (ref 0.44–1.00)
GFR calc Af Amer: 29 mL/min — ABNORMAL LOW (ref >60)
GFR calc non Af Amer: 25 mL/min — ABNORMAL LOW (ref >60)
Glucose, Bld: 151 mg/dL — ABNORMAL HIGH (ref 70–99)
Potassium: 3.4 mmol/L — ABNORMAL LOW (ref 3.5–5.1)
Sodium: 137 mmol/L (ref 135–145)

## 2019-08-14 LAB — URINE DRUG SCREEN, QUALITATIVE (ARMC ONLY)
Amphetamines, Ur Screen: NOT DETECTED
Barbiturates, Ur Screen: NOT DETECTED
Benzodiazepine, Ur Scrn: NOT DETECTED
Cannabinoid 50 Ng, Ur ~~LOC~~: NOT DETECTED
Cocaine Metabolite,Ur ~~LOC~~: NOT DETECTED
MDMA (Ecstasy)Ur Screen: NOT DETECTED
Methadone Scn, Ur: NOT DETECTED
Opiate, Ur Screen: POSITIVE — AB
Phencyclidine (PCP) Ur S: NOT DETECTED
Tricyclic, Ur Screen: POSITIVE — AB

## 2019-08-14 LAB — MAGNESIUM: Magnesium: 2 mg/dL (ref 1.7–2.4)

## 2019-08-14 LAB — SARS CORONAVIRUS 2 (TAT 6-24 HRS): SARS Coronavirus 2: NEGATIVE

## 2019-08-14 MED ORDER — CYCLOBENZAPRINE HCL 10 MG PO TABS: 5.0000 mg | ORAL_TABLET | Freq: Three times a day (TID) | ORAL | Status: DC | PRN

## 2019-08-14 MED ORDER — SERTRALINE HCL 50 MG PO TABS
25.0000 mg | ORAL_TABLET | Freq: Every day | ORAL | Status: DC
  Administered 2019-08-14: 25 mg via ORAL
  Filled 2019-08-14 (×2): qty 1

## 2019-08-14 MED ORDER — CYCLOBENZAPRINE HCL 10 MG PO TABS: 5.0000 mg | ORAL_TABLET | Freq: Two times a day (BID) | ORAL | Status: DC | PRN

## 2019-08-14 NOTE — ED Notes (Signed)
Report called to Jennifer, RN.

## 2019-08-14 NOTE — Progress Notes (Addendum)
Inpatient Diabetes Program Recommendations  AACE/ADA: New Consensus Statement on Inpatient Glycemic Control   Target Ranges:  Prepandial:   less than 140 mg/dL      Peak postprandial:   less than 180 mg/dL (1-2 hours)      Critically ill patients:  140 - 180 mg/dL   Results for [name], [name] (MRN [id number]) as of 08/14/2019 10:19  Ref. Range 08/13/2019 17:28 08/13/2019 20:42 08/14/2019 01:16 08/14/2019 05:00 08/14/2019 08:04  Glucose-Capillary Latest Ref Range: 70 - 99 mg/dL 385 (H)  Novolog 9 units 166 (H)  Novolog 2 units 143 (H)  Novolog 1 unit  Levemir 20 units @ 1:43 137 (H) 138 (H)  Novolog 1 units  Levemir 20 units @ 10:43   Review of Glycemic Control  Diabetes history: DM2 Outpatient Diabetes medications: Levemir 30 units BID Current orders for Inpatient glycemic control: Levemir 20 units BID, Novolog 0-9 units Q4H  Inpatient Diabetes Program Recommendations:   Insulin - Basal: If patient experiences any issues with hypoglycemia, please consider decreasing Levemir to 20 units QHS.  Thanks, Barnie Alderman, RN, MSN, CDE Diabetes Coordinator Inpatient Diabetes Program [phone] (Team Pager from 8am to 5pm)

## 2019-08-14 NOTE — Progress Notes (Signed)
[facility] NAME: [name]    MR#:  [id number]  DATE OF BIRTH:  12/10/1955  SUBJECTIVE:   Patient came in with generalized body aches and had a fall yesterday. Denies any trauma. Found to have dehydration. Patient states she is trying to drink. Feels of better after IV fluids. Blood pressure much improved.  REVIEW OF SYSTEMS:   Review of Systems  Constitutional: Negative for chills, fever and weight loss.  HENT: Negative for ear discharge, ear pain and nosebleeds.   Eyes: Negative for blurred vision, pain and discharge.  Respiratory: Negative for sputum production, shortness of breath, wheezing and stridor.   Cardiovascular: Negative for chest pain, palpitations, orthopnea and PND.  Gastrointestinal: Negative for abdominal pain, diarrhea, nausea and vomiting.  Genitourinary: Negative for frequency and urgency.  Musculoskeletal: Positive for back pain, falls, myalgias and neck pain. Negative for joint pain.  Neurological: Positive for weakness. Negative for sensory change, speech change and focal weakness.  Psychiatric/Behavioral: Negative for depression and hallucinations. The patient is not nervous/anxious.    Tolerating Diet: yes Tolerating PT: pending  DRUG ALLERGIES:   Allergies  Allergen Reactions  . Aspirin Rash  . Codeine Sulfate Rash  . Erythromycin Rash    Reaction:  Fever   . Prednisone Swelling  . Rosiglitazone Maleate Swelling  . Tetanus-Diphtheria Toxoids Td Rash and Other (See Comments)    Reaction:  Fever     VITALS:  Blood pressure 128/84, pulse 89, temperature (!) 97.2 F (36.2 C), temperature source Oral, resp. rate 18, height 5' 3"  (1.6 m), weight 71.2 kg, SpO2 100 %.  PHYSICAL EXAMINATION:   Physical Exam  GENERAL:  63 y.o.-year-old patient lying in the bed with no acute distress.  EYES: Pupils equal, round, reactive to light and accommodation. No scleral icterus. Extraocular muscles intact.   HEENT: Head atraumatic, normocephalic. Oropharynx and nasopharynx clear. Oral mucosa dry NECK:  Supple, no jugular venous distention. No thyroid enlargement, no tenderness.  LUNGS: Normal breath sounds bilaterally, no wheezing, rales, rhonchi. No use of accessory muscles of respiration.  CARDIOVASCULAR: S1, S2 normal. No murmurs, rubs, or gallops.  ABDOMEN: Soft, nontender, nondistended. Bowel sounds present. No organomegaly or mass.  EXTREMITIES: No cyanosis, clubbing or edema b/l.    NEUROLOGIC: Cranial nerves II through XII are intact. No focal Motor or sensory deficits b/l.   PSYCHIATRIC:  patient is alert and oriented x 3.  SKIN: No obvious rash, lesion, or ulcer.   LABORATORY PANEL:  CBC Recent Labs  Lab 08/13/19 1059  WBC 8.2  HGB 11.2*  HCT 35.8*  PLT 174    Chemistries  Recent Labs  Lab 08/14/19 0549 08/14/19 0747  NA  --  137  K  --  3.4*  CL  --  109  CO2  --  19*  GLUCOSE  --  151*  BUN  --  23  CREATININE  --  2.02*  CALCIUM  --  8.1*  MG 2.0  --    Cardiac Enzymes No results for input(s): TROPONINI in the last 168 hours. RADIOLOGY:  Dg Lumbar Spine 2-3 Views  Result Date: 08/13/2019 CLINICAL DATA:  Low back pain secondary to multiple recent falls including today. EXAM: LUMBAR SPINE - 2-3 VIEW COMPARISON:  Radiographs dated 12/16/2014 FINDINGS: There is a chronic lumbar scoliosis with convexity to the left centered at L2, unchanged. Lateral alignment is normal. No disc space narrowing. No fractures. Facet arthritis at L5-S1 on  the left, unchanged. Right ureteral stent in place, unchanged. Aortic atherosclerosis. IMPRESSION: 1. No acute abnormality. 2.  Aortic Atherosclerosis (ICD10-I70.0). Electronically Signed   By: Lorriane Shire M.D.   On: 08/13/2019 10:59   Dg Pelvis 1-2 Views  Result Date: 08/13/2019 CLINICAL DATA:  Lower back pain, hip pain and BILATERAL foot pain, fell at home EXAM: PELVIS - 1-2 VIEW COMPARISON:  07/14/2019 FINDINGS: Mid and distal  aspects of a RIGHT ureteral stent appear unchanged. Diffuse osseous demineralization. Hip and SI joint spaces preserved. No fracture, dislocation, or bone destruction. IMPRESSION: No acute abnormalities. Electronically Signed   By: Lavonia Dana M.D.   On: 08/13/2019 10:59   Ct Head Wo Contrast  Result Date: 08/13/2019 CLINICAL DATA:  Fall, head strike EXAM: CT HEAD WITHOUT CONTRAST TECHNIQUE: Contiguous axial images were obtained from the base of the skull through the vertex without intravenous contrast. COMPARISON:  July 14, 2019 FINDINGS: Brain: There is no acute intracranial hemorrhage, mass-effect, or edema. Gray-white differentiation is preserved. There is no extra-axial fluid collection. Ventricles and sulci are stable in size and configuration. Vascular: There is atherosclerotic calcification at the skull base. Skull: Calvarium is unremarkable. Sinuses/Orbits: No acute finding. Other: None. IMPRESSION: No evidence of acute intracranial injury. Electronically Signed   By: Macy Mis M.D.   On: 08/13/2019 10:47   Ct Cervical Spine Wo Contrast  Result Date: 08/13/2019 CLINICAL DATA:  Fall EXAM: CT CERVICAL SPINE WITHOUT CONTRAST TECHNIQUE: Multidetector CT imaging of the cervical spine was performed without intravenous contrast. Multiplanar CT image reconstructions were also generated. COMPARISON:  August 18, 2018 FINDINGS: Alignment: Anteroposterior alignment is maintained. Skull base and vertebrae: There is no acute fracture. Vertebral body heights are preserved. Soft tissues and spinal canal: No prevertebral fluid or swelling. No visible canal hematoma. Disc levels: Mild degenerative changes are present. There is no significant stenosis. Upper chest: Negative. Other: None. IMPRESSION: No acute cervical spine fracture. Electronically Signed   By: Macy Mis M.D.   On: 08/13/2019 10:54   Dg Foot Complete Left  Result Date: 08/13/2019 CLINICAL DATA:  BILATERAL foot pain post fall EXAM:  LEFT FOOT - COMPLETE 3+ VIEW COMPARISON:  None FINDINGS: Osseous demineralization. Degenerative changes at IP joint great toe. Remaining joint spaces preserved. No acute fracture, dislocation, or bone destruction. Tiny ossicle at the base of the middle phalanx LEFT fourth toe medially, appears corticated/old. Soft tissues unremarkable. IMPRESSION: Osseous demineralization with degenerative changes at IP joint great toe. No acute abnormalities identified. Electronically Signed   By: Lavonia Dana M.D.   On: 08/13/2019 11:05   Dg Foot Complete Right  Result Date: 08/13/2019 CLINICAL DATA:  BILATERAL foot pain post fall EXAM: RIGHT FOOT COMPLETE - 3+ VIEW COMPARISON:  None FINDINGS: Osseous demineralization. Degenerative changes at IP joint great toe. Remaining joint spaces preserved. Non fused accessory ossicle at medial margin of mid foot. No acute fracture, dislocation, or bone destruction. Small plantar calcaneal spur. IMPRESSION: No acute osseous abnormalities. Electronically Signed   By: Lavonia Dana M.D.   On: 08/13/2019 11:07   Ct Maxillofacial Wo Contrast  Result Date: 08/13/2019 CLINICAL DATA:  Fall EXAM: CT MAXILLOFACIAL WITHOUT CONTRAST TECHNIQUE: Multidetector CT imaging of the maxillofacial structures was performed. Multiplanar CT image reconstructions were also generated. COMPARISON:  None. FINDINGS: Osseous: No acute fracture. Temporomandibular joints are unremarkable. Orbits: No intraorbital hematoma. Sinuses: Minor mucosal thickening. Soft tissues: Negative. Limited intracranial: Dictated separately. IMPRESSION: No acute facial fracture. Electronically Signed   By: Macy Mis  M.D.   On: 08/13/2019 10:50   ASSESSMENT AND PLAN:   TRUST CRAGO is a 63 y.o. female with medical history significant of hypertension, diabetes mellitus, stroke, GERD, hypothyroidism, depression, anxiety, uterine cancer, stomach ulcer, PAF on Eliquis, diverticulitis, C. difficile colitis, liver cirrhosis noted  due to alcohol abuse, ascites, chronic pain syndrome, CKD stage III, orthostatic hypotension, who presents with multiple fall.   Generalized weakness with fall at home without trauma  - Patient has had multiple fall.  Possibly due to hypotension /orthostatic hypotension with dehydration - Pt is taking midodrine at home.  Her hypotension is likely due to dehydration.   -Blood pressure responded to IV fluid quickly. - negative CT of head, CT of C-spine and CT of maxillofacial image.   -X-ray of left foot, right foot, lumbar spine and pelvis are all negative for bony fracture.  -PT/OT and  Chronic pain syndrome: -continue home prn oxycodone -small dose of Flexeril 5 mg TID PRN (was taking 10 mg TID at home)  CVA (cerebral vascular accident) [facility]): -on Eliquis  Atrial fibrillation ([place]): -continue Eliquis -hold metoprolol due to hypotension.  Acute renal failure superimposed on stage 3a chronic kidney disease ([place]):  Baseline Cre is 1.3  On 07/31/19, pt's Cre is 2.56 -- IV fluids-- 2.02. - Likely due to dehydration.  -ATN is also possible due to hypotension. - IVF as above - Avoid using renal toxic medications, hypotension  -patient feels better today  Type II diabetes mellitus with renal manifestations ([place]): , hyperglycemia uncontrolled -Last A1c 9.7 on 05/25/19, poorly controlied. - Patient is taking levemir at home -will decrease levemir dose from 30 to 20 units bid -- given renal failure and resume home dose at discharge once creatinine better   -SSI  Cirrhosis of liver not due to alcohol [facility]):  No GI symptoms.  Mental status normal.  Depression and anxiety: Stable, no suicidal or homicidal ideations. -hold zoloft due to QTc prolongation.    Family communication : with patient Consults : PT OT Discharge Disposition : home CODE STATUS: full  DVT Prophylaxis : eliquis  TOTAL TIME TAKING CARE OF THIS PATIENT: *30* minutes.  >50% time spent on counselling and  coordination of care  POSSIBLE D/C IN *1-2* DAYS, DEPENDING ON CLINICAL CONDITION.  Note: This dictation was prepared with Dragon dictation along with smaller phrase technology. Any transcriptional errors that result from this process are unintentional.  Fritzi Mandes M.D on 08/14/2019 at 2:11 PM  Between 7am to 6pm - Pager - [phone]  After 6pm go to [email or web address]  [facility]   CC: Primary care physician; [name], NPPatient ID: [name], female   DOB: 03-Aug-1956, 63 y.o.   MRN: [id number]

## 2019-08-14 NOTE — ED Notes (Signed)
Lights dimmed some for pt.

## 2019-08-14 NOTE — Progress Notes (Signed)
Assisted patient to [facility].  Patient had pink-tinged urine.  Patient had no other complaints at this time.  Notified Dr. Kennon Holter of color of patient urine.  Received no new orders at this time for patient, but instructed to call if color of urine worsens.

## 2019-08-14 NOTE — Evaluation (Addendum)
Occupational Therapy Evaluation Patient Details Name: [name] MRN: [id number] DOB: 1956/04/01 Today's Date: 08/14/2019    History of Present Illness Pt is 63 y/o F with PMH HTN, DM, stroke, GERD, depression, anxiety, uterine cancer, stomach ulcers, PAF on eliquis, CVA, and CKD III. Pt presented to ED with c/o body aches and several recent falls w/o trauma, but with pain. Pt was found to be dehydrated with BP improving after recieving fluids. Pt with positive orthostatics.   Clinical Impression   Pt seen for OT evaluation this date. Prior to hospital admission, pt was MOD I with fxl mobility for short household distances with intermittent assist from her husband.  Pt lives in mobile home with a ramped entrance with her spouse. She also has some assistance from PCA for transportation and getting groceries.  Currently pt demonstrates impairments in sitting and standing balance and tolerance requiring MIN A with ADL mobility and transfers and MOD A with LB ADLs.  Pt would benefit from skilled OT to address noted impairments and functional limitations (see below for any additional details) in order to maximize safety and independence while minimizing falls risk and caregiver burden. Upon hospital discharge, current recommendation is to SNF pending progress, will continue to assess for appropriateness.    Follow Up Recommendations  SNF    Equipment Recommendations  None recommended by OT(pt reports having all necessary equipment)    Recommendations for Other Services       Precautions / Restrictions Precautions Precautions: Fall Restrictions Weight Bearing Restrictions: No      Mobility Bed Mobility Overal bed mobility: Needs Assistance Bed Mobility: Supine to Sit;Sit to Supine     Supine to sit: Min assist Sit to supine: Min assist      Transfers Overall transfer level: Needs assistance   Transfers: Sit to/from [employer] Transfers Sit to Stand: Min assist    Squat pivot transfers: Min assist;Mod assist          Balance Overall balance assessment: Needs assistance Sitting-balance support: Feet supported;No upper extremity supported Sitting balance-Leahy Scale: Good     Standing balance support: Bilateral upper extremity supported Standing balance-Leahy Scale: Poor Standing balance comment: requires UE support and external support of 1p assist to maintain static standing balance.                           ADL either performed or assessed with clinical judgement   ADL Overall ADL's : Needs assistance/impaired Eating/Feeding: Set up;Sitting   Grooming: Wash/dry face;Set up;Sitting   Upper Body Bathing: Set up;Supervision/ safety;Sitting   Lower Body Bathing: Minimal assistance;Moderate assistance;Sit to/from stand   Upper Body Dressing : Set up;Supervision/safety;Sitting   Lower Body Dressing: Minimal assistance;Moderate assistance;Sit to/from stand   Toilet Transfer: Minimal assistance;Stand-pivot;BSC   Toileting- Clothing Manipulation and Hygiene: Minimal assistance;Moderate assistance;Sit to/from stand               Vision Baseline Vision/History: Wears glasses Wears Glasses: At all times Patient Visual Report: No change from baseline       Perception     Praxis      Pertinent Vitals/Pain Pain Assessment: Faces Faces Pain Scale: Hurts even more Pain Location: B LEs in standing Pain Descriptors / Indicators: Aching;Sharp Pain Intervention(s): Limited activity within patient's tolerance;Monitored during session     Hand Dominance Right   Extremity/Trunk Assessment Upper Extremity Assessment Upper Extremity Assessment: RUE deficits/detail;LUE deficits/detail RUE Deficits / Details: grossly >3/5 LUE Deficits /  Details: grossly >3/5   Lower Extremity Assessment Lower Extremity Assessment: Defer to PT evaluation;Generalized weakness       Communication Communication Communication: No  difficulties   Cognition Arousal/Alertness: Awake/alert Behavior During Therapy: WFL for tasks assessed/performed Overall Cognitive Status: Within Functional Limits for tasks assessed                                     General Comments       Exercises Other Exercises Other Exercises: OT facilitates education re: role of OT in acute setting. Pt verbalized understanding. Other Exercises: OT facilitates education re: safety and fall prevention considerations for both the acute setting and home environment. Other Exercises: OT facilitates education re: discharge recommendations for safety. Pt verbalized understanding, but is apprehensive about potentially needing rehab.   Shoulder Instructions      Home Living Family/patient expects to be discharged to:: Private residence Living Arrangements: Spouse/significant other Available Help at Discharge: Family;Available 24 hours/day Type of Home: Mobile home Home Access: Ramped entrance     Home Layout: One level     Bathroom Shower/Tub: Tub/shower unit;Curtain   Biochemist, clinical: Standard     Home Equipment: Environmental consultant - 2 wheels;Cane - quad;Cane - single point;Bedside commode   Additional Comments: Pt reports that her husband helps with household IADLs such as cooking, cleaning and laundry. They also recieve meals on wheels for food assitance. States she has PCA that helps get groceries and drives pt or husband when needed. States son can also help some. Was supposed to start HHPT yesterday or today, but post-poned as pt has returned to hospital d/t several falls.      Prior Functioning/Environment Level of Independence: Independent with assistive device(s)        Comments: Pt reports she used 2WW for short household distances, has BSC that she uses at night. States she has recliner she sits in most of day. She is usually able to make it to the restroom, sometimes she needs her husband's support to make it back to bedroom  with 2WW in the evening. States she sponge bathes in sitting.        OT Problem List: Decreased strength;Decreased range of motion;Decreased activity tolerance;Impaired balance (sitting and/or standing);Cardiopulmonary status limiting activity;Pain      OT Treatment/Interventions: Self-care/ADL training;Therapeutic exercise;Energy conservation;Therapeutic activities;Patient/family education;Balance training    OT Goals(Current goals can be found in the care plan section) Acute Rehab OT Goals Patient Stated Goal: for my balance to get better OT Goal Formulation: With patient Time For Goal Achievement: 08/28/19 Potential to Achieve Goals: Good  OT Frequency: Min 1X/week   Barriers to D/C:            Co-evaluation              AM-PAC OT "6 Clicks" Daily Activity     Outcome Measure Help from another person eating meals?: None Help from another person taking care of personal grooming?: A Little Help from another person toileting, which includes using toliet, bedpan, or urinal?: A Little Help from another person bathing (including washing, rinsing, drying)?: A Lot Help from another person to put on and taking off regular upper body clothing?: None Help from another person to put on and taking off regular lower body clothing?: A Lot 6 Click Score: 18   End of Session Equipment Utilized During Treatment: Gait belt  Activity Tolerance: Patient tolerated treatment well(some dizziness  limiting) Patient left: in bed;with call bell/phone within reach(with CNA presenting to transport pt to the floor)  OT Visit Diagnosis: Unsteadiness on feet (R26.81);Muscle weakness (generalized) (M62.81);History of falling (Z91.81)                Time: 3664-4034 OT Time Calculation (min): 23 min Charges:  OT General Charges $OT Visit: 1 Visit OT Evaluation $OT Eval Moderate Complexity: 1 Mod OT Treatments $Self Care/Home Management : 8-22 mins  Gerrianne Scale, MS, OTR/L ascom  [phone] 08/14/19, 3:37 PM

## 2019-08-15 DIAGNOSIS — R296 Repeated falls: Secondary | ICD-10-CM

## 2019-08-15 DIAGNOSIS — K7460 Unspecified cirrhosis of liver: Secondary | ICD-10-CM

## 2019-08-15 LAB — CBC
HCT: 30 % — ABNORMAL LOW (ref 36.0–46.0)
Hemoglobin: 9.2 g/dL — ABNORMAL LOW (ref 12.0–15.0)
MCH: 26.6 pg (ref 26.0–34.0)
MCHC: 30.7 g/dL (ref 30.0–36.0)
MCV: 86.7 fL (ref 80.0–100.0)
Platelets: 139 10*3/uL — ABNORMAL LOW (ref 150–400)
RBC: 3.46 MIL/uL — ABNORMAL LOW (ref 3.87–5.11)
RDW: 16.1 % — ABNORMAL HIGH (ref 11.5–15.5)
WBC: 5.5 10*3/uL (ref 4.0–10.5)
nRBC: 0 % (ref 0.0–0.2)

## 2019-08-15 LAB — GLUCOSE, CAPILLARY
Glucose-Capillary: 135 mg/dL — ABNORMAL HIGH (ref 70–99)
Glucose-Capillary: 143 mg/dL — ABNORMAL HIGH (ref 70–99)
Glucose-Capillary: 158 mg/dL — ABNORMAL HIGH (ref 70–99)

## 2019-08-15 LAB — CREATININE, SERUM
Creatinine, Ser: 1.45 mg/dL — ABNORMAL HIGH (ref 0.44–1.00)
GFR calc Af Amer: 44 mL/min — ABNORMAL LOW (ref >60)
GFR calc non Af Amer: 38 mL/min — ABNORMAL LOW (ref >60)

## 2019-08-15 MED ORDER — METOPROLOL TARTRATE 25 MG PO TABS: 25.0000 mg | ORAL_TABLET | Freq: Two times a day (BID) | ORAL | Status: DC

## 2019-08-15 MED ORDER — MIDODRINE HCL 5 MG PO TABS: 10.0000 mg | ORAL_TABLET | Freq: Three times a day (TID) | ORAL | Status: DC

## 2019-08-15 NOTE — Evaluation (Signed)
Physical Therapy Evaluation Patient Details Name: [name] MRN: [id number] DOB: [birth year] Today's Date: 08/15/2019   History of Present Illness  presented to ER secondary to multiple falls, progressive weakness; admitted for management of hypotension related to dehydration, orthostasis.  Clinical Impression  Upon evaluation, patient alert and oriented; follows commands and agreeable to session.  Rates pain in L LE 7/10, generalized soreness after fall.  Bilat UE/LE strength and ROM otherwise grossly symmetrical and WFL; L LE slightly guarded throughout.  Demonstrates ability to complete bed mobility with close sup; sit/stand, basic transfers and short-distance gait (5') with RW, cga/close sup.  3-point, step to gait pattern; decreased stance time/weight shift to L LE due to pain; heavy WBing bilat UEs. However, no overt buckling or LOB.  Additional l distance limited by pain.  Will continue to progress as appropriate in subsequent session. Of note, vitals stable and WFL throughout session; no orthostasis noted with transition to upright.  HR mildly elevated in 110-120s throughout session; RN informed/aware. Would benefit from skilled PT to address above deficits and promote optimal return to PLOF.; Recommend transition to HHPT upon discharge from acute hospitalization.     Follow Up Recommendations Home health PT    Equipment Recommendations  (has RW)    Recommendations for Other Services       Precautions / Restrictions Precautions Precautions: Fall Restrictions Weight Bearing Restrictions: No      Mobility  Bed Mobility Overal bed mobility: Needs Assistance Bed Mobility: Supine to Sit     Supine to sit: Supervision        Transfers Overall transfer level: Needs assistance Equipment used: Rolling walker (2 wheeled) Transfers: Sit to/from Stand Sit to Stand: Min guard;Supervision         General transfer comment: cuing for hand placement; heavy use of bilat  UEs for lift off, balance and unweighting of  LLE  Ambulation/Gait Ambulation/Gait assistance: Min guard Gait Distance (Feet): 5 Feet Assistive device: Rolling walker (2 wheeled)       General Gait Details: 3-point, step to gait pattern; decreased stance time/weight shift to L LE due to pain; heavy WBing bilat UEs. However, no overt buckling or LOB.  Additional distance limited by pain.  Stairs            Wheelchair Mobility    Modified Rankin (Stroke Patients Only)       Balance Overall balance assessment: Needs assistance Sitting-balance support: No upper extremity supported;Feet supported Sitting balance-Leahy Scale: Good     Standing balance support: Bilateral upper extremity supported Standing balance-Leahy Scale: Fair                               Pertinent Vitals/Pain Pain Assessment: 0-10 Pain Score: 7  Pain Location: L LE Pain Descriptors / Indicators: Aching;Sore Pain Intervention(s): Limited activity within patient's tolerance;Monitored during session;Repositioned;Patient requesting pain meds-RN notified;RN gave pain meds during session    [place] expects to be discharged to:: Private residence Living Arrangements: Spouse/significant other Available Help at Discharge: Family;Available 24 hours/day Type of Home: Mobile home Home Access: Ramped entrance     Home Layout: One level Home Equipment: [place] - 2 wheels;Cane - quad;Cane - single point;Bedside commode      Prior Function Level of Independence: Independent with assistive device(s)         Comments: Pt reports she used 2WW for short household distances, has BSC that she uses at night.  States she has recliner she sits in most of day. She is usually able to make it to the restroom, sometimes she needs her husband's support to make it back to bedroom with 2WW in the evening. States she sponge bathes in sitting.     Hand Dominance   Dominant Hand: Right     Extremity/Trunk Assessment   Upper Extremity Assessment Upper Extremity Assessment: Overall WFL for tasks assessed    Lower Extremity Assessment Lower Extremity Assessment: Overall WFL for tasks assessed(grossly at least 4/5, L LE guarded due to pain)       Communication   Communication: No difficulties  Cognition Arousal/Alertness: Awake/alert Behavior During Therapy: WFL for tasks assessed/performed Overall Cognitive Status: Within Functional Limits for tasks assessed                                        General Comments      Exercises Other Exercises Other Exercises: Sit/stand x2 with RW, cga/min asst; heavy use of bilat UEs for lift off, balance Other Exercises: Orthostatic assessment-see vitals flowsheet for results; stable and WFL.   Assessment/Plan    PT Assessment Patient needs continued PT services  PT Problem List Decreased activity tolerance;Decreased balance;Decreased mobility;Pain       PT Treatment Interventions DME instruction;Gait training;Functional mobility training;Therapeutic activities;Therapeutic exercise;Balance training;Patient/family education    PT Goals (Current goals can be found in the Care Plan section)  Acute Rehab PT Goals Patient Stated Goal: for my balance to get better PT Goal Formulation: With patient Time For Goal Achievement: 08/29/19 Potential to Achieve Goals: Good    Frequency Min 2X/week   Barriers to discharge        Co-evaluation               AM-PAC PT "6 Clicks" Mobility  Outcome Measure Help needed turning from your back to your side while in a flat bed without using bedrails?: None Help needed moving from lying on your back to sitting on the side of a flat bed without using bedrails?: None Help needed moving to and from a bed to a chair (including a wheelchair)?: A Little Help needed standing up from a chair using your arms (e.g., wheelchair or bedside chair)?: A Little Help needed to  walk in hospital room?: A Little Help needed climbing 3-5 steps with a railing? : A Little 6 Click Score: 20    End of Session Equipment Utilized During Treatment: Gait belt Activity Tolerance: Patient tolerated treatment well;Patient limited by pain Patient left: in chair;with call bell/phone within reach;with chair alarm set Nurse Communication: Mobility status PT Visit Diagnosis: Muscle weakness (generalized) (M62.81);Difficulty in walking, not elsewhere classified (R26.2);Pain Pain - Right/Left: Left Pain - part of body: Leg    Time: 9450-3888 PT Time Calculation (min) (ACUTE ONLY): 27 min   Charges:   PT Evaluation $PT Eval Moderate Complexity: 1 Mod PT Treatments $Therapeutic Activity: 8-22 mins       Ranita Stjulien H. Owens Shark, PT, DPT, NCS 08/15/19, 9:47 AM [phone]

## 2019-08-15 NOTE — Discharge Summary (Signed)
[facility] NAME: [name]    MR#:  [id number]  DATE OF BIRTH:  1955-09-16  DATE OF ADMISSION:  08/13/2019 ADMITTING PHYSICIAN: Fritzi Mandes, MD  DATE OF DISCHARGE: 08/15/2019  PRIMARY CARE PHYSICIAN: [name], NP    ADMISSION DIAGNOSIS:  AKI (acute kidney injury) ([place]) [N17.9] Frequent falls [R29.6] Hypotension, unspecified hypotension type [I95.9] Fall [W19.XXXA]  DISCHARGE DIAGNOSIS:  acute renal failure due to dehydration improved frequent falls hypotension due to dehydration resolved chronic tachycardia  SECONDARY DIAGNOSIS:   Past Medical History:  Diagnosis Date  . Acute encephalopathy 05/22/2018  . Allergy   . Anxiety   . Ascites   . C. difficile colitis 07/10/2015  . Cancer (HCC)    HX OF CANCER OF UTERUS   . Cirrhosis of liver not due to alcohol ([place]) 2016  . Degenerative disk disease   . Diverticulitis   . Gastroparesis   . GERD (gastroesophageal reflux disease)   . History of hiatal hernia   . Hypertension   . Hypothyroid   . Hypothyroidism due to amiodarone   . Ileus ([place]) 08/01/2015  . Intussusception intestine ([place]) 05/2015  . Orthostatic hypotension   . PAF (paroxysmal atrial fibrillation) ([place]) 03/2015   a. new onset 03/2015 in setting of intractable N/V; b. on Eliquis 5 mg bid; c. CHADSVASc 4 (DM, TIA x 2, female)  . Pancreatitis   . Pneumonia 11/14/2015  . Right ureteral stone 07/14/2016  . Sepsis ([place]) 12/15/2017  . Sick sinus syndrome ([place])   . Stomach ulcer   . Stroke [facility])    with minimal left sided weakness  . Syncope 01/2015  . Syncope due to orthostatic hypotension 05/18/2015  . Tachyarrhythmia 01/10/2016  . TIA (transient ischemic attack) 02/2015  . Type 1 diabetes ([place])    on levemir  . UTERINE CANCER, HX OF 03/27/2007   Qualifier: Diagnosis of  By: Maxie Better FNP, Rosalita Levan   . UTI (urinary tract infection) 05/22/2018    HOSPITAL COURSE:   [name] a 63  y.o.femalewith medical history significant ofhypertension, diabetes mellitus, stroke, GERD, hypothyroidism, depression, anxiety, uterine cancer, stomach ulcer, PAF on Eliquis, diverticulitis, C. difficile colitis, liver cirrhosis noted due to alcohol abuse, ascites, chronic pain syndrome, CKD stage III, orthostatic hypotension, who presents with multiplefalls.  Generalized weakness with fall at home without trauma  -Patient has had multiple falls. Possibly due to hypotension/orthostatic hypotension with dehydration - Pt is taking midodrine at home.Her hypotension is likely due to dehydration.  -Blood pressure responded to IV fluid quickly. - negative CT of head, CT of C-spine and CT of maxillofacialimage.  -X-ray of left foot, right foot, lumbar spine and pelvis are all negative for bony fracture.  -PT/OT  input appreciated.-- Will resume home health PT   chronic pain syndrome: -continue homeprn oxycodone -PRN Flexeril  H/o Atrial fibrillation (HCC) and chronic tachycardia followed by Dr. Rockey Situ -patient was recently seen by Dr. Rockey Situ on July 29, 2019. Given her falls and cirrhosis Dr. Rockey Situ recommends not a good candidate for eliquis. This was discussed with patient and I have stopped her eliquis - Per Dr Gwenyth Ober note on 11/18 "Paroxysmal atrial fibrillation In the setting of colitis with septic shock History of stroke Previously on amiodarone, discontinued in April 2017 Not a good candidate for Eliquis at this time given high fall risk, cirrhosis"  -patient has known history of chronic tachycardia. Her metoprolol is resume the 25 mg BID  Acute renal failure superimposed on stage 3a chronic kidney disease (HCC):Baseline Cre is1.3 On 07/31/19, pt's Cre is2.56 -- IV fluids-- 2.02--1.45. - Likely due todehydration.  -ATN is also possible due to hypotension. -Received IV fluids - feels a lot better - Avoid using renal toxic medications, hypotension   Type II  diabetes mellitus with renal manifestations ([place]):, hyperglycemia uncontrolled -Last A1c9.7 on 05/25/19, poorly controlied. - Patient is takinglevemirat home -resume home dose of Levemir now that creatinine is improved -continue SSI  Cirrhosis of liver not due to alcohol ([place]):  No GIsymptoms. Mental status normal.  Depression and anxiety:Stable, no suicidal or homicidal ideations. -cont home meds    Family communication : with patient Consults : PT OT Discharge Disposition : home today--pt agreeable CODE STATUS: full  DVT Prophylaxis : ambulation CONSULTS OBTAINED:    DRUG ALLERGIES:   Allergies  Allergen Reactions  . Aspirin Rash  . Codeine Sulfate Rash  . Erythromycin Rash    Reaction:  Fever   . Prednisone Swelling  . Rosiglitazone Maleate Swelling  . Tetanus-Diphtheria Toxoids Td Rash and Other (See Comments)    Reaction:  Fever     DISCHARGE MEDICATIONS:   Allergies as of 08/15/2019      Reactions   Aspirin Rash   Codeine Sulfate Rash   Erythromycin Rash   Reaction:  Fever    Prednisone Swelling   Rosiglitazone Maleate Swelling   Tetanus-diphtheria Toxoids Td Rash, Other (See Comments)   Reaction:  Fever       Medication List    STOP taking these medications   apixaban 5 MG Tabs tablet Commonly known as: ELIQUIS     TAKE these medications   albuterol 108 (90 Base) MCG/ACT inhaler Commonly known as: VENTOLIN HFA Inhale 2 puffs into the lungs every 6 (six) hours as needed for wheezing or shortness of breath.   cyclobenzaprine 10 MG tablet Commonly known as: FLEXERIL Take 1 tablet (10 mg total) by mouth 3 (three) times daily as needed for muscle spasms. Must last 30 days.   Insulin Detemir 100 UNIT/ML Pen Commonly known as: LEVEMIR Inject 30 units in the morning and 30 units at bedtime. What changed:   how much to take  how to take this  when to take this  additional instructions   metoprolol tartrate 25 MG tablet Commonly  known as: LOPRESSOR Take 1 tablet (25 mg total) by mouth 2 (two) times daily.   midodrine 5 MG tablet Commonly known as: PROAMATINE Take 1 tablet (5 mg total) by mouth 3 (three) times daily with meals.   nitroGLYCERIN 0.3 MG SL tablet Commonly known as: Nitrostat Place 1 tablet (0.3 mg total) under the tongue every 5 (five) minutes as needed for chest pain.   oxyCODONE 5 MG immediate release tablet Commonly known as: Oxy IR/ROXICODONE Take 1 tablet (5 mg total) by mouth every 4 (four) hours as needed for severe pain. Must last 30 days. Max: 6/day What changed: Another medication with the same name was removed. Continue taking this medication, and follow the directions you see here.   sertraline 25 MG tablet Commonly known as: ZOLOFT TAKE 1 TABLET AT BEDTIME FOR ANXIETY What changed:   how much to take  how to take this  when to take this  additional instructions       If you experience worsening of your admission symptoms, develop shortness of breath, life threatening emergency, suicidal or homicidal thoughts you must seek medical attention immediately by calling 911  or calling your MD immediately  if symptoms less severe.  You Must read complete instructions/literature along with all the possible adverse reactions/side effects for all the Medicines you take and that have been prescribed to you. Take any new Medicines after you have completely understood and accept all the possible adverse reactions/side effects.   Please note  You were cared for by a hospitalist during your hospital stay. If you have any questions about your discharge medications or the care you received while you were in the hospital after you are discharged, you can call the unit and asked to speak with the hospitalist on call if the hospitalist that took care of you is not available. Once you are discharged, your primary care physician will handle any further medical issues. Please note that NO REFILLS for  any discharge medications will be authorized once you are discharged, as it is imperative that you return to your primary care physician (or establish a relationship with a primary care physician if you do not have one) for your aftercare needs so that they can reassess your need for medications and monitor your lab values. Today   SUBJECTIVE   Patient feels a lot better. She is wanting to go home. Issues with chronic pain VITAL SIGNS:  Blood pressure (!) 150/78, pulse (!) 102, temperature 98.9 F (37.2 C), temperature source Oral, resp. rate 18, height 5' 3"  (1.6 m), weight 71.2 kg, SpO2 100 %.  I/O:    Intake/Output Summary (Last 24 hours) at 08/15/2019 1040 Last data filed at 08/15/2019 0900 Gross per 24 hour  Intake 2040.99 ml  Output -  Net 2040.99 ml    PHYSICAL EXAMINATION:  GENERAL:  63 y.o.-year-old patient lying in the bed with no acute distress.  EYES: Pupils equal, round, reactive to light and accommodation. No scleral icterus. Extraocular muscles intact.  HEENT: Head atraumatic, normocephalic. Oropharynx and nasopharynx clear.  NECK:  Supple, no jugular venous distention. No thyroid enlargement, no tenderness.  LUNGS: Normal breath sounds bilaterally, no wheezing, rales,rhonchi or crepitation. No use of accessory muscles of respiration.  CARDIOVASCULAR: S1, S2 normal. No murmurs, rubs, or gallops.  ABDOMEN: Soft, non-tender, non-distended. Bowel sounds present. No organomegaly or mass.  EXTREMITIES: No pedal edema, cyanosis, or clubbing.  NEUROLOGIC: Cranial nerves II through XII are intact. Muscle strength 5/5 in all extremities. Sensation intact. Gait not checked.  PSYCHIATRIC: The patient is alert and oriented x 3.  SKIN: No obvious rash, lesion, or ulcer.   DATA REVIEW:   CBC  Recent Labs  Lab 08/15/19 0429  WBC 5.5  HGB 9.2*  HCT 30.0*  PLT 139*    Chemistries  Recent Labs  Lab 08/14/19 0549 08/14/19 0747 08/15/19 0429  NA  --  137  --   K  --   3.4*  --   CL  --  109  --   CO2  --  19*  --   GLUCOSE  --  151*  --   BUN  --  23  --   CREATININE  --  2.02* 1.45*  CALCIUM  --  8.1*  --   MG 2.0  --   --     Microbiology Results   Recent Results (from the past 240 hour(s))  SARS CORONAVIRUS 2 (TAT 6-24 HRS) Nasopharyngeal Nasopharyngeal Swab     Status: None   Collection Time: 08/13/19  2:39 PM   Specimen: Nasopharyngeal Swab  Result Value Ref Range Status   SARS Coronavirus 2  NEGATIVE NEGATIVE Final    Comment: (NOTE) SARS-CoV-2 target nucleic acids are NOT DETECTED. The SARS-CoV-2 RNA is generally detectable in upper and lower respiratory specimens during the acute phase of infection. Negative results do not preclude SARS-CoV-2 infection, do not rule out co-infections with other pathogens, and should not be used as the sole basis for treatment or other patient management decisions. Negative results must be combined with clinical observations, patient history, and epidemiological information. The expected result is Negative. Fact Sheet for Patients: SugarRoll.be Fact Sheet for Healthcare Providers: https://www.woods-mathews.com/ This test is not yet approved or cleared by the [place] FDA and  has been authorized for detection and/or diagnosis of SARS-CoV-2 by FDA under an Emergency Use Authorization (EUA). This EUA will remain  in effect (meaning this test can be used) for the duration of the COVID-19 declaration under Section 56 4(b)(1) of the Act, 21 U.S.C. section 360bbb-3(b)(1), unless the authorization is terminated or revoked sooner. Performed at [facility], [place] [address] [place], [place] [address]     RADIOLOGY:  Dg Lumbar Spine 2-3 Views  Result Date: 08/13/2019 CLINICAL DATA:  Low back pain secondary to multiple recent falls including today. EXAM: LUMBAR SPINE - 2-3 VIEW COMPARISON:  Radiographs dated 12/16/2014 FINDINGS: There is a chronic lumbar  scoliosis with convexity to the left centered at L2, unchanged. Lateral alignment is normal. No disc space narrowing. No fractures. Facet arthritis at L5-S1 on the left, unchanged. Right ureteral stent in place, unchanged. Aortic atherosclerosis. IMPRESSION: 1. No acute abnormality. 2.  Aortic Atherosclerosis (ICD10-I70.0). Electronically Signed   By: Lorriane Shire M.D.   On: 08/13/2019 10:59   Dg Pelvis 1-2 Views  Result Date: 08/13/2019 CLINICAL DATA:  Lower back pain, hip pain and BILATERAL foot pain, fell at home EXAM: PELVIS - 1-2 VIEW COMPARISON:  07/14/2019 FINDINGS: Mid and distal aspects of a RIGHT ureteral stent appear unchanged. Diffuse osseous demineralization. Hip and SI joint spaces preserved. No fracture, dislocation, or bone destruction. IMPRESSION: No acute abnormalities. Electronically Signed   By: Lavonia Dana M.D.   On: 08/13/2019 10:59   Dg Foot Complete Left  Result Date: 08/13/2019 CLINICAL DATA:  BILATERAL foot pain post fall EXAM: LEFT FOOT - COMPLETE 3+ VIEW COMPARISON:  None FINDINGS: Osseous demineralization. Degenerative changes at IP joint great toe. Remaining joint spaces preserved. No acute fracture, dislocation, or bone destruction. Tiny ossicle at the base of the middle phalanx LEFT fourth toe medially, appears corticated/old. Soft tissues unremarkable. IMPRESSION: Osseous demineralization with degenerative changes at IP joint great toe. No acute abnormalities identified. Electronically Signed   By: Lavonia Dana M.D.   On: 08/13/2019 11:05   Dg Foot Complete Right  Result Date: 08/13/2019 CLINICAL DATA:  BILATERAL foot pain post fall EXAM: RIGHT FOOT COMPLETE - 3+ VIEW COMPARISON:  None FINDINGS: Osseous demineralization. Degenerative changes at IP joint great toe. Remaining joint spaces preserved. Non fused accessory ossicle at medial margin of mid foot. No acute fracture, dislocation, or bone destruction. Small plantar calcaneal spur. IMPRESSION: No acute osseous  abnormalities. Electronically Signed   By: Lavonia Dana M.D.   On: 08/13/2019 11:07     CODE STATUS:     Code Status Orders  (From admission, onward)         Start     Ordered   08/13/19 1653  Full code  Continuous     08/13/19 1652        Code Status History    Date  Active Date Inactive Code Status Order ID Comments User Context   07/14/2019 2233 07/18/2019 1614 Full Code [id number]  Harvie Bridge, DO Inpatient   05/16/2019 8891 05/17/2019 1848 Full Code [id number]  Harrie Foreman, MD Inpatient   11/26/2018 2145 11/27/2018 1816 Full Code [id number]  Lance Coon, MD ED   09/25/2018 0922 09/26/2018 2028 Full Code [id number]  Salary, Avel Peace, MD Inpatient   08/18/2018 2234 08/20/2018 1923 DNR [id number]  Rise Patience, MD ED   08/18/2018 2136 08/18/2018 2234 Full Code [id number]  Rise Patience, MD ED   05/22/2018 1101 05/24/2018 1850 Full Code [id number]  Molt, [place], DO ED   12/27/2017 1245 12/30/2017 2040 Full Code [id number]  Demetrios Loll, MD Inpatient   12/15/2017 0501 12/17/2017 1854 Full Code [id number]  Harrie Foreman, MD Inpatient   08/06/2016 2036 08/09/2016 1945 Full Code [id number]  Vaughan Basta, MD ED   07/14/2016 0426 07/17/2016 1934 Full Code [id number]  Harrie Foreman, MD Inpatient   07/09/2016 0453 07/10/2016 1708 Full Code [id number]  Lance Coon, MD Inpatient   06/17/2016 2245 06/20/2016 1509 Full Code [id number]  Lance Coon, MD Inpatient   06/02/2016 0546 06/06/2016 1821 DNR [id number]  Harrie Foreman, MD Inpatient   05/12/2016 1143 05/16/2016 1638 DNR [id number]  Dustin Flock, MD Inpatient   05/12/2016 0113 05/12/2016 1143 Full Code [id number]  Hugelmeyer, [place], DO Inpatient   04/07/2016 0618 04/09/2016 1419 DNR [id number]  Harrie Foreman, MD Inpatient   03/13/2016 2125 03/16/2016 1832 DNR [id number]  Lance Coon, MD Inpatient   02/04/2016 0939 02/07/2016 1459 DNR [id number]  Saundra Shelling, MD Inpatient   01/21/2016 2224 01/25/2016 1702 DNR [id number]   Lance Coon, MD Inpatient   01/15/2016 1339 01/19/2016 1529 DNR [id number]  Gladstone Lighter, MD ED   01/10/2016 2200 01/13/2016 1724 DNR [id number]  Harrie Foreman, MD Inpatient   12/27/2015 2302 01/01/2016 1817 DNR [id number]  Max Sane, MD Inpatient   12/16/2015 1654 12/19/2015 1652 DNR [id number]  Loletha Grayer, MD ED   11/11/2015 1136 11/16/2015 1405 Full Code [id number]  Samella Parr, NP Inpatient   10/15/2015 0123 10/16/2015 1817 Full Code [id number]  Lance Coon, MD Inpatient   10/04/2015 2019 10/08/2015 1834 Full Code [id number]  Bethena Roys, MD Inpatient   08/15/2015 0105 08/16/2015 2004 Full Code [id number]  Harrie Foreman, MD Inpatient   08/01/2015 0125 08/06/2015 1741 Full Code [id number]  Harrie Foreman, MD Inpatient   07/04/2015 0253 07/11/2015 1833 Full Code [id number]  Lytle Butte, MD ED   05/27/2015 2248 05/31/2015 1437 Full Code [id number]  Rise Patience, MD Inpatient   05/19/2015 0114 05/22/2015 1628 Full Code [id number]  Rise Patience, MD Inpatient   04/05/2015 0916 04/07/2015 1723 Full Code [id number]  Bettey Costa, MD Inpatient   04/03/2015 1231 04/05/2015 0916 Full Code [id number]  Bettey Costa, MD Inpatient   03/11/2015 1649 03/14/2015 1550 Full Code [id number]  Dustin Flock, MD Inpatient   02/15/2015 1651 02/17/2015 1433 DNR [id number]  Dustin Flock, MD Inpatient   02/15/2015 1635 02/15/2015 1651 Full Code [id number]  Dustin Flock, MD Inpatient   01/11/2015 0240 01/12/2015 1811 Full Code [id number]  Etta Quill, DO ED   Advance Care Planning Activity    Advance Directive Documentation     Most Recent Value  Type of Advance Directive  Living will, Out of facility DNR (pink MOST or yellow form)  Pre-existing out of facility DNR order (yellow form or pink MOST form)  -  "  MOST" Form in Place?  -     Family Discussion: patient updated Consults: physical therapy   TOTAL TIME TAKING CARE OF THIS PATIENT: **40* minutes.    Fritzi Mandes M.D on 08/15/2019 at 10:40  AM  Between 7am to 6pm - Pager - [phone] After 6pm go to [email or web address] - password [email or web address]  [facility]    CC: Primary care physician; [name], NP

## 2019-08-15 NOTE — Discharge Instructions (Signed)
Drink adequate fluids at home throughout the day

## 2019-08-17 ENCOUNTER — Ambulatory Visit: Payer: Medicare Other | Admitting: Cardiovascular Disease

## 2019-08-17 LAB — GLUCOSE, CAPILLARY: Glucose-Capillary: 226 mg/dL — ABNORMAL HIGH (ref 70–99)

## 2019-08-18 ENCOUNTER — Other Ambulatory Visit: Payer: Self-pay

## 2019-08-18 ENCOUNTER — Ambulatory Visit (INDEPENDENT_AMBULATORY_CARE_PROVIDER_SITE_OTHER): Payer: Medicare Other | Admitting: Primary Care

## 2019-08-18 ENCOUNTER — Encounter: Payer: Self-pay | Admitting: Primary Care

## 2019-08-18 ENCOUNTER — Ambulatory Visit (INDEPENDENT_AMBULATORY_CARE_PROVIDER_SITE_OTHER)
Admission: RE | Admit: 2019-08-18 | Discharge: 2019-08-18 | Disposition: A | Discharge status: Home / Self Care | Payer: Medicare Other | Source: Ambulatory Visit | Attending: Primary Care | Admitting: Primary Care

## 2019-08-18 ENCOUNTER — Other Ambulatory Visit: Payer: Self-pay | Admitting: Primary Care

## 2019-08-18 VITALS — BP 126/80 | HR 124 | Temp 97.5°F

## 2019-08-18 DIAGNOSIS — M79672 Pain in left foot: Secondary | ICD-10-CM

## 2019-08-18 DIAGNOSIS — R000 Tachycardia, unspecified: Secondary | ICD-10-CM

## 2019-08-18 DIAGNOSIS — N179 Acute kidney failure, unspecified: Secondary | ICD-10-CM

## 2019-08-18 DIAGNOSIS — E1165 Type 2 diabetes mellitus with hyperglycemia: Secondary | ICD-10-CM | POA: Diagnosis not present

## 2019-08-18 DIAGNOSIS — S92512A Displaced fracture of proximal phalanx of left lesser toe(s), initial encounter for closed fracture: Secondary | ICD-10-CM | POA: Diagnosis not present

## 2019-08-18 DIAGNOSIS — R296 Repeated falls: Secondary | ICD-10-CM

## 2019-08-18 DIAGNOSIS — E785 Hyperlipidemia, unspecified: Secondary | ICD-10-CM | POA: Diagnosis not present

## 2019-08-18 DIAGNOSIS — S92902A Unspecified fracture of left foot, initial encounter for closed fracture: Secondary | ICD-10-CM

## 2019-08-18 LAB — POCT GLYCOSYLATED HEMOGLOBIN (HGB A1C): Hemoglobin A1C: 11.6 % — AB (ref 4.0–5.6)

## 2019-08-18 LAB — LIPID PANEL
Cholesterol: 123 mg/dL (ref 0–200)
HDL: 35.4 mg/dL — ABNORMAL LOW (ref >39.00)
LDL Cholesterol: 58 mg/dL (ref 0–99)
NonHDL: 87.21
Total CHOL/HDL Ratio: 3
Triglycerides: 145 mg/dL (ref 0.0–149.0)
VLDL: 29 mg/dL (ref 0.0–40.0)

## 2019-08-18 LAB — BASIC METABOLIC PANEL
BUN: 14 mg/dL (ref 6–23)
CO2: 23 mEq/L (ref 19–32)
Calcium: 8.5 mg/dL (ref 8.4–10.5)
Chloride: 106 mEq/L (ref 96–112)
Creatinine, Ser: 1.26 mg/dL — ABNORMAL HIGH (ref 0.40–1.20)
GFR: 42.53 mL/min — ABNORMAL LOW (ref >60.00)
Glucose, Bld: 280 mg/dL — ABNORMAL HIGH (ref 70–99)
Potassium: 3.4 mEq/L — ABNORMAL LOW (ref 3.5–5.1)
Sodium: 137 mEq/L (ref 135–145)

## 2019-08-18 NOTE — Assessment & Plan Note (Signed)
A1C today of 11.6 which is a significant increase from last check and very much above her goal of <8.  Given her frail state with numerous co-morbidities I recommended endocrinology evaluation, she kindly declines.  Discussed that it is imperative that she check glucose levels at least 2-3 times daily if I'm going to continue to manage her levels.  We also discussed nutrition at home and the need to work on reducing processed carbohydrates, increase water.  Increase levemir to 32 units BID. She will call if glucose readings remain at or above 300 after two weeks.   Follow up in 1 month, she will bring glucose logs.

## 2019-08-18 NOTE — Progress Notes (Signed)
Subjective:    Patient ID: [name], female    DOB: 03-16-1956, 63 y.o.   MRN: [id number]  HPI  [name] is a 63 year old female with numerous medical conditions including type 2 diabetes, recurrent falls, tachycardia, hypertension, paroxysmal atrial fibrillation, cirrhosis, CAD, encephalopathy who presents today for hospital follow up.  She presented to [facility] ED on 08/13/19 with complaints of numerous falls over the last 18 hours without clear cause. Unable to ambulate since falls, also with pain from falls. Work up in the ED revealed AKI, hypotension. She was also orthostatic in regards to vital signs despite one liter of NS. All imaging including xrays and CT's were without acute process. She was admitted for further evaluation.  During her hospital stay she was treated with continuous IV fluids and PT/OT. Metoprolol was held due to hypotension, Levemir was decreased from 30 units BID to 20 units BID. Creatinine improved with proper hydration. She was discharged home on 08/15/19 with instructions to resume metoprolol and Levemir 30 units BID, and home health PT.  Since her discharge home she's fallen several times including a fall today. She's been contacted by PT who is planning to come out today for a session. She uses her cane and walker often when at home. She's drinking 4-5 bottles of water daily along with 2 Ensure drinks daily on average.   She's not checked her glucose levels in one week. She is compliant to her Levemir 30 units BID. She is complaint to her metoprolol twice daily and midodrine 5 mg TID She has not had her ureteral stent removed despite numerous recommendations. She plans on calling to have this rescheduled.   Today she denies dizziness and headaches. She has acute left knee and foot pain from her fall today. She's fallen twice onto the left foot since her hospital stay.   She's eating TV dinners for most of the time, also has Meals on Wheels which delivers 5  meals per week. She will skip meals two days weekly on average as she isn't hungry. She is sedentary during the day.  BP Readings from Last 3 Encounters:  08/18/19 126/80  08/15/19 (!) 150/78  07/31/19 128/80   This visit occurred during the SARS-CoV-2 public health emergency.  Safety protocols were in place, including screening questions prior to the visit, additional usage of staff PPE, and extensive cleaning of exam room while observing appropriate contact time as indicated for disinfecting solutions.     Review of Systems  Respiratory: Negative for shortness of breath.   Cardiovascular: Negative for chest pain.  Musculoskeletal: Positive for arthralgias.       Acute left food and knee pain  Skin: Positive for color change.  Neurological: Negative for headaches.       Past Medical History:  Diagnosis Date  . Acute encephalopathy 05/22/2018  . Allergy   . Anxiety   . Ascites   . C. difficile colitis 07/10/2015  . Cancer (HCC)    HX OF CANCER OF UTERUS   . Cirrhosis of liver not due to alcohol ([place]) 2016  . Degenerative disk disease   . Diverticulitis   . Gastroparesis   . GERD (gastroesophageal reflux disease)   . History of hiatal hernia   . Hypertension   . Hypothyroid   . Hypothyroidism due to amiodarone   . Ileus ([place]) 08/01/2015  . Intussusception intestine ([place]) 05/2015  . Orthostatic hypotension   . PAF (paroxysmal atrial fibrillation) ([place]) 03/2015  a. new onset 03/2015 in setting of intractable N/V; b. on Eliquis 5 mg bid; c. CHADSVASc 4 (DM, TIA x 2, female)  . Pancreatitis   . Pneumonia 11/14/2015  . Right ureteral stone 07/14/2016  . Sepsis ([place]) 12/15/2017  . Sick sinus syndrome ([place])   . Stomach ulcer   . Stroke [facility])    with minimal left sided weakness  . Syncope 01/2015  . Syncope due to orthostatic hypotension 05/18/2015  . Tachyarrhythmia 01/10/2016  . TIA (transient ischemic attack) 02/2015  . Type 1 diabetes ([place])    on levemir  . UTERINE CANCER, HX  OF 03/27/2007   Qualifier: Diagnosis of  By: Maxie Better FNP, Rosalita Levan   . UTI (urinary tract infection) 05/22/2018     Social History   Socioeconomic History  . Marital status: Married    Spouse name: Murielle Stang   . Number of children: Not on file  . Years of education: Not on file  . Highest education level: Not on file  Occupational History  . Occupation: Disabled 2nd back problems  Social Needs  . Financial resource strain: Not hard at all  . Food insecurity    Worry: Never true    Inability: Never true  . Transportation needs    Medical: No    Non-medical: No  Tobacco Use  . Smoking status: Former Smoker    Types: Cigarettes  . Smokeless tobacco: Never Used  . Tobacco comment: 25 years ago and only smoked occasionally  Substance and Sexual Activity  . Alcohol use: No  . Drug use: No  . Sexual activity: Not Currently  Lifestyle  . Physical activity    Days per week: 0 days    Minutes per session: 0 min  . Stress: Not at all  Relationships  . Social Herbalist on phone: Patient refused    Gets together: Patient refused    Attends religious service: Patient refused    Active member of club or organization: Patient refused    Attends meetings of clubs or organizations: Patient refused    Relationship status: Patient refused  . Intimate partner violence    Fear of current or ex partner: Patient refused    Emotionally abused: Patient refused    Physically abused: Patient refused    Forced sexual activity: Patient refused  Other Topics Concern  . Not on file  Social History Narrative   Lives in [place], [place] with her husband and 2 sons.    Past Surgical History:  Procedure Laterality Date  . ABDOMINAL HYSTERECTOMY    . CARDIAC CATHETERIZATION N/A 01/12/2016   Procedure: Left Heart Cath and Coronary Angiography;  Surgeon: Wellington Hampshire, MD;  Location: [facility];  Service: Cardiovascular;  Laterality: N/A;  . CHOLECYSTECTOMY    .  CYSTOSCOPY/URETEROSCOPY/HOLMIUM LASER Right 07/14/2016   Procedure: CYSTOSCOPY/URETEROSCOPY/HOLMIUM LASER;  Surgeon: Alexis Frock, MD;  Location: [facility] ORS;  Service: Urology;  Laterality: Right;  . ESOPHAGOGASTRODUODENOSCOPY N/A 04/04/2015   Procedure: ESOPHAGOGASTRODUODENOSCOPY (EGD);  Surgeon: Hulen Luster, MD;  Location: [facility] ENDOSCOPY;  Service: Endoscopy;  Laterality: N/A;  . ESOPHAGOGASTRODUODENOSCOPY N/A 12/28/2017   Procedure: ESOPHAGOGASTRODUODENOSCOPY (EGD);  Surgeon: Lin Landsman, MD;  Location[facility] ENDOSCOPY;  Service: Gastroenterology;  Laterality: N/A;  . ESOPHAGOGASTRODUODENOSCOPY (EGD) WITH PROPOFOL N/A 01/18/2016   Procedure: ESOPHAGOGASTRODUODENOSCOPY (EGD) WITH PROPOFOL;  Surgeon: Lucilla Lame, MD;  Location: [facility] ENDOSCOPY;  Service: Endoscopy;  Laterality: N/A;  . FLEXIBLE SIGMOIDOSCOPY N/A 01/18/2016   Procedure: FLEXIBLE  SIGMOIDOSCOPY;  Surgeon: Lucilla Lame, MD;  Location: [facility] ENDOSCOPY;  Service: Endoscopy;  Laterality: N/A;  . HERNIA REPAIR      Family History  Problem Relation Age of Onset  . Hypertension Mother   . CAD Sister   . Heart attack Sister        Deceased 11-05-2014  . CAD Brother     Allergies  Allergen Reactions  . Aspirin Rash  . Codeine Sulfate Rash  . Erythromycin Rash    Reaction:  Fever   . Prednisone Swelling  . Rosiglitazone Maleate Swelling  . Tetanus-Diphtheria Toxoids Td Rash and Other (See Comments)    Reaction:  Fever     Current Outpatient Medications on File Prior to Visit  Medication Sig Dispense Refill  . albuterol (PROVENTIL HFA;VENTOLIN HFA) 108 (90 Base) MCG/ACT inhaler Inhale 2 puffs into the lungs every 6 (six) hours as needed for wheezing or shortness of breath. 1 Inhaler 2  . cyclobenzaprine (FLEXERIL) 10 MG tablet Take 1 tablet (10 mg total) by mouth 3 (three) times daily as needed for muscle spasms. Must last 30 days. 270 tablet 1  . Insulin Detemir (LEVEMIR) 100 UNIT/ML Pen Inject 30 units in the morning and 30 units  at bedtime. (Patient taking differently: Inject 30 Units into the skin 2 (two) times daily. ) 15 mL 5  . metoprolol tartrate (LOPRESSOR) 25 MG tablet Take 1 tablet (25 mg total) by mouth 2 (two) times daily. 180 tablet 3  . midodrine (PROAMATINE) 5 MG tablet Take 1 tablet (5 mg total) by mouth 3 (three) times daily with meals. 270 tablet 3  . nitroGLYCERIN (NITROSTAT) 0.3 MG SL tablet Place 1 tablet (0.3 mg total) under the tongue every 5 (five) minutes as needed for chest pain. 100 tablet 0  . sertraline (ZOLOFT) 25 MG tablet TAKE 1 TABLET AT BEDTIME FOR ANXIETY (Patient taking differently: Take 25 mg by mouth at bedtime. ) 90 tablet 3  . oxyCODONE (OXY IR/ROXICODONE) 5 MG immediate release tablet Take 1 tablet (5 mg total) by mouth every 4 (four) hours as needed for severe pain. Must last 30 days. Max: 6/day 180 tablet 0   No current facility-administered medications on file prior to visit.     BP 126/80   Pulse (!) 124   Temp (!) 97.5 F (36.4 C) (Temporal)   SpO2 98%    Objective:   Physical Exam  Constitutional: She appears well-nourished.  Neck: Neck supple.  Cardiovascular: Normal rate.  Sinus tachycardia  Respiratory: Effort normal and breath sounds normal.  Musculoskeletal:     Left foot: Decreased range of motion. Bony tenderness and swelling present. No deformity.       Feet:  Skin: Skin is warm and dry.  Dark purple/blue coloring to dorsal foot and toes of left foot  Psychiatric: She has a normal mood and affect.           Assessment & Plan:

## 2019-08-18 NOTE — Patient Instructions (Signed)
Stop by the lab and xray prior to leaving today. I will notify you of your results once received.   Start checking your blood sugar levels.  Appropriate times to check your blood sugar levels are:  -Before any meal (breakfast, lunch, dinner) -Two hours after any meal (breakfast, lunch, dinner) -Bedtime  Record your readings and notify me if you continue to consistently run at or above 300   We've increased your Levemir to 32 units twice daily.  Please schedule a follow up appointment in 1 month, bring your sugar logs.  It was a pleasure to see you today!   Diabetes Mellitus and Nutrition, Adult When you have diabetes (diabetes mellitus), it is very important to have healthy eating habits because your blood sugar (glucose) levels are greatly affected by what you eat and drink. Eating healthy foods in the appropriate amounts, at about the same times every day, can help you:  Control your blood glucose.  Lower your risk of heart disease.  Improve your blood pressure.  Reach or maintain a healthy weight. Every person with diabetes is different, and each person has different needs for a meal plan. Your health care provider may recommend that you work with a diet and nutrition specialist (dietitian) to make a meal plan that is best for you. Your meal plan may vary depending on factors such as:  The calories you need.  The medicines you take.  Your weight.  Your blood glucose, blood pressure, and cholesterol levels.  Your activity level.  Other health conditions you have, such as heart or kidney disease. How do carbohydrates affect me? Carbohydrates, also called carbs, affect your blood glucose level more than any other type of food. Eating carbs naturally raises the amount of glucose in your blood. Carb counting is a method for keeping track of how many carbs you eat. Counting carbs is important to keep your blood glucose at a healthy level, especially if you use insulin or take  certain oral diabetes medicines. It is important to know how many carbs you can safely have in each meal. This is different for every person. Your dietitian can help you calculate how many carbs you should have at each meal and for each snack. Foods that contain carbs include:  Bread, cereal, rice, pasta, and crackers.  Potatoes and corn.  Peas, beans, and lentils.  Milk and yogurt.  Fruit and juice.  Desserts, such as cakes, cookies, ice cream, and candy. How does alcohol affect me? Alcohol can cause a sudden decrease in blood glucose (hypoglycemia), especially if you use insulin or take certain oral diabetes medicines. Hypoglycemia can be a life-threatening condition. Symptoms of hypoglycemia (sleepiness, dizziness, and confusion) are similar to symptoms of having too much alcohol. If your health care provider says that alcohol is safe for you, follow these guidelines:  Limit alcohol intake to no more than 1 drink per day for nonpregnant women and 2 drinks per day for men. One drink equals 12 oz of beer, 5 oz of wine, or 1 oz of hard liquor.  Do not drink on an empty stomach.  Keep yourself hydrated with water, diet soda, or unsweetened iced tea.  Keep in mind that regular soda, juice, and other mixers may contain a lot of sugar and must be counted as carbs. What are tips for following this plan?  Reading food labels  Start by checking the serving size on the "Nutrition Facts" label of packaged foods and drinks. The amount of calories, carbs,  fats, and other nutrients listed on the label is based on one serving of the item. Many items contain more than one serving per package.  Check the total grams (g) of carbs in one serving. You can calculate the number of servings of carbs in one serving by dividing the total carbs by 15. For example, if a food has 30 g of total carbs, it would be equal to 2 servings of carbs.  Check the number of grams (g) of saturated and trans fats in one  serving. Choose foods that have low or no amount of these fats.  Check the number of milligrams (mg) of salt (sodium) in one serving. Most people should limit total sodium intake to less than 2,300 mg per day.  Always check the nutrition information of foods labeled as "low-fat" or "nonfat". These foods may be higher in added sugar or refined carbs and should be avoided.  Talk to your dietitian to identify your daily goals for nutrients listed on the label. Shopping  Avoid buying canned, premade, or processed foods. These foods tend to be high in fat, sodium, and added sugar.  Shop around the outside edge of the grocery store. This includes fresh fruits and vegetables, bulk grains, fresh meats, and fresh dairy. Cooking  Use low-heat cooking methods, such as baking, instead of high-heat cooking methods like deep frying.  Cook using healthy oils, such as olive, canola, or sunflower oil.  Avoid cooking with butter, cream, or high-fat meats. Meal planning  Eat meals and snacks regularly, preferably at the same times every day. Avoid going long periods of time without eating.  Eat foods high in fiber, such as fresh fruits, vegetables, beans, and whole grains. Talk to your dietitian about how many servings of carbs you can eat at each meal.  Eat 4-6 ounces (oz) of lean protein each day, such as lean meat, chicken, fish, eggs, or tofu. One oz of lean protein is equal to: ? 1 oz of meat, chicken, or fish. ? 1 egg. ?  cup of tofu.  Eat some foods each day that contain healthy fats, such as avocado, nuts, seeds, and fish. Lifestyle  Check your blood glucose regularly.  Exercise regularly as told by your health care provider. This may include: ? 150 minutes of moderate-intensity or vigorous-intensity exercise each week. This could be brisk walking, biking, or water aerobics. ? Stretching and doing strength exercises, such as yoga or weightlifting, at least 2 times a week.  Take medicines  as told by your health care provider.  Do not use any products that contain nicotine or tobacco, such as cigarettes and e-cigarettes. If you need help quitting, ask your health care provider.  Work with a Social worker or diabetes educator to identify strategies to manage stress and any emotional and social challenges. Questions to ask a health care provider  Do I need to meet with a diabetes educator?  Do I need to meet with a dietitian?  What number can I call if I have questions?  When are the best times to check my blood glucose? Where to find more information:  American Diabetes Association: diabetes.org  Academy of Nutrition and Dietetics: www.eatright.[employer] of Diabetes and Digestive and Kidney Diseases (NIH): [employer] Summary  A healthy meal plan will help you control your blood glucose and maintain a healthy lifestyle.  Working with a diet and nutrition specialist (dietitian) can help you make a meal plan that is best for you.  Keep in  mind that carbohydrates (carbs) and alcohol have immediate effects on your blood glucose levels. It is important to count carbs and to use alcohol carefully. This information is not intended to replace advice given to you by your health care provider. Make sure you discuss any questions you have with your health care provider. Document Released: 05/24/2005 Document Revised: 08/09/2017 Document Reviewed: 10/01/2016 Elsevier Patient Education  2020 [employer].

## 2019-08-18 NOTE — Assessment & Plan Note (Signed)
Since acute dehydration prior to recent hospital stay. Creatinine nearly doubled her usual during initial checks during her stay.  Strongly advised she work on proper hydration daily. Repeat BMP pending.

## 2019-08-18 NOTE — Assessment & Plan Note (Signed)
Continued despite metoprolol tartrate BID. BP stable.  Given frail state we will defer changes to her cardiologist for whom she will be seeing next week.

## 2019-08-18 NOTE — Assessment & Plan Note (Signed)
Continued, even post hospital stay.  Cause seems to be multifactorial including dehydration, lack of daily physical activity, deconditioning, numerous co-morbidities.  Continue with home health PT. Discussed to work on daily activity to improve endurance and strength.   Hospital labs, imaging, and notes reviewed.

## 2019-08-18 NOTE — Assessment & Plan Note (Signed)
S/P falls prior to and after recent hospital stay. Exam today with older appearing bruising but due to two falls since hospital stay and xray, will repeat xray.

## 2019-08-19 ENCOUNTER — Ambulatory Visit: Payer: Medicare Other | Admitting: Primary Care

## 2019-08-24 ENCOUNTER — Ambulatory Visit (INDEPENDENT_AMBULATORY_CARE_PROVIDER_SITE_OTHER): Payer: Medicare Other | Admitting: Urology

## 2019-08-24 ENCOUNTER — Other Ambulatory Visit: Payer: Self-pay

## 2019-08-24 ENCOUNTER — Telehealth: Payer: Self-pay | Admitting: *Deleted

## 2019-08-24 ENCOUNTER — Encounter: Payer: Self-pay | Admitting: Urology

## 2019-08-24 VITALS — BP 146/94 | HR 125 | Ht 63.0 in | Wt 150.0 lb

## 2019-08-24 DIAGNOSIS — N200 Calculus of kidney: Secondary | ICD-10-CM

## 2019-08-24 DIAGNOSIS — Z8542 Personal history of malignant neoplasm of other parts of uterus: Secondary | ICD-10-CM

## 2019-08-24 MED ORDER — AMOXICILLIN-POT CLAVULANATE 875-125 MG PO TABS: 1.0000 | ORAL_TABLET | Freq: Two times a day (BID) | ORAL | 0 refills | Status: DC

## 2019-08-24 MED ORDER — CEFTRIAXONE SODIUM 500 MG IJ SOLR: 500.0000 mg | Freq: Once | INTRAMUSCULAR | Status: DC

## 2019-08-24 MED ORDER — CEFTRIAXONE SODIUM 500 MG IJ SOLR
1000.0000 mg | Freq: Once | INTRAMUSCULAR | Status: AC
  Administered 2019-08-24: 1000 mg via INTRAMUSCULAR

## 2019-08-24 NOTE — Telephone Encounter (Signed)
Patient left a voicemail stating that she is wondering if she has neuropathy?. Patient stated that she talked with her sister and she was diagnosed with diabetic neuropathy and that is what the patient feels that she may have. Patient wants to know if she can be checked for that?

## 2019-08-24 NOTE — Telephone Encounter (Signed)
Spoken to patient and does not want to wait in a few weeks. Upon patient's request, I have schedule patient's appointment on 08/26/2019

## 2019-08-24 NOTE — Progress Notes (Signed)
Cystoscopy Procedure Note:  Indication:  Complex patient well-known to me who has a long history of being lost to follow-up.  She is a comorbid 63 year old female with history of a right ureteral stone treated by Dr. Tresa Moore in November 2017 with stent placement, who was unfortunately lost to follow-up and her stent remains in place.  She is had multiple hospitalizations for UTI, including a peri-renal abscess treated with antibiotics in January 2020 that had resolved on repeat CT in September 2020.  Ceftriaxone was given for prophylaxis today.  After informed consent and discussion of the procedure and its risks, [name] was positioned and prepped in the standard fashion. Cystoscopy was performed with a flexible cystoscope. The stent was grasped with flexible graspers and removed in its entirety easily. The patient tolerated the procedure well.  There was moderate encrustation at the distal end of the stent, but the stent was otherwise clean.  Findings: Uncomplicated stent removal  Assessment and Plan: Augmentin twice daily x7 days for UTI prevention in the setting of removal of 39-year-old stent Follow up in 6 weeks with renal ultrasound to evaluate for silent hydronephrosis  Billey Co, MD 08/24/2019

## 2019-08-24 NOTE — Telephone Encounter (Signed)
Please notify patient that we can discuss during her upcoming visit in a few weeks.

## 2019-08-25 ENCOUNTER — Telehealth: Payer: Self-pay | Admitting: Primary Care

## 2019-08-25 ENCOUNTER — Telehealth: Payer: Self-pay | Admitting: *Deleted

## 2019-08-25 ENCOUNTER — Ambulatory Visit: Payer: Medicare Other | Admitting: Primary Care

## 2019-08-25 LAB — URINALYSIS, COMPLETE
Bilirubin, UA: NEGATIVE
Ketones, UA: NEGATIVE
Nitrite, UA: NEGATIVE
Specific Gravity, UA: 1.015 (ref 1.005–1.030)
Urobilinogen, Ur: 0.2 mg/dL (ref 0.2–1.0)
pH, UA: 6.5 (ref 5.0–7.5)

## 2019-08-25 LAB — MICROSCOPIC EXAMINATION
RBC, Urine: >30 /hpf — AB (ref 0–2)
WBC, UA: >30 /hpf — AB (ref 0–5)

## 2019-08-25 NOTE — Telephone Encounter (Signed)
Patient was scheduled at [facility] for fracture and No Showed for the appointment. Just an FYI.

## 2019-08-25 NOTE — Telephone Encounter (Signed)
Noted  

## 2019-08-25 NOTE — Telephone Encounter (Signed)
[name] PTA with [facility] called stating that she saw the patient today and was told that she has a virtual visit with Rita Bossier NP tomorrow. [name] stated that she wrote down the vital signs for the patient and just wanted to make sure that [name] knew what she got today. [name] stated that her blood pressure was 144/92, heart rate 124-fast, oxygen level 99%. [name] stated that patient had a temperature of 100.1, so she suggested tylenol. [name] stated that patient does not have a cough or headache, so she is not concerned about covid. [name] there is no record of patient having an appointment tomorrow. [name] stated that she is going to call the patient back and check on her temperature and will question her again about the virtual visit she thinks that she has tomorrow.

## 2019-08-25 NOTE — Telephone Encounter (Signed)
Noted.  She has a chronic history of tachycardia, is seeing cardiology for this. Blood pressure is labile so I do not recommend altering her medications at this time.  What happened to her appointment, Vallarie Mare?

## 2019-08-26 ENCOUNTER — Emergency Department: Payer: Medicare Other

## 2019-08-26 ENCOUNTER — Encounter: Payer: Self-pay | Admitting: Intensive Care

## 2019-08-26 ENCOUNTER — Ambulatory Visit: Payer: Medicare Other | Admitting: Primary Care

## 2019-08-26 ENCOUNTER — Other Ambulatory Visit: Payer: Self-pay

## 2019-08-26 ENCOUNTER — Emergency Department
Admission: EM | Admit: 2019-08-26 | Discharge: 2019-08-26 | Disposition: A | Discharge status: Home / Self Care | Payer: Medicare Other | Attending: Emergency Medicine | Admitting: Emergency Medicine

## 2019-08-26 DIAGNOSIS — R509 Fever, unspecified: Secondary | ICD-10-CM | POA: Diagnosis not present

## 2019-08-26 DIAGNOSIS — E039 Hypothyroidism, unspecified: Secondary | ICD-10-CM | POA: Diagnosis not present

## 2019-08-26 DIAGNOSIS — R262 Difficulty in walking, not elsewhere classified: Secondary | ICD-10-CM

## 2019-08-26 DIAGNOSIS — E1165 Type 2 diabetes mellitus with hyperglycemia: Secondary | ICD-10-CM | POA: Diagnosis not present

## 2019-08-26 DIAGNOSIS — Z79899 Other long term (current) drug therapy: Secondary | ICD-10-CM | POA: Diagnosis not present

## 2019-08-26 DIAGNOSIS — I129 Hypertensive chronic kidney disease with stage 1 through stage 4 chronic kidney disease, or unspecified chronic kidney disease: Secondary | ICD-10-CM | POA: Insufficient documentation

## 2019-08-26 DIAGNOSIS — I2510 Atherosclerotic heart disease of native coronary artery without angina pectoris: Secondary | ICD-10-CM | POA: Insufficient documentation

## 2019-08-26 DIAGNOSIS — E1122 Type 2 diabetes mellitus with diabetic chronic kidney disease: Secondary | ICD-10-CM | POA: Diagnosis not present

## 2019-08-26 DIAGNOSIS — R55 Syncope and collapse: Secondary | ICD-10-CM | POA: Diagnosis not present

## 2019-08-26 DIAGNOSIS — Z8673 Personal history of transient ischemic attack (TIA), and cerebral infarction without residual deficits: Secondary | ICD-10-CM | POA: Diagnosis not present

## 2019-08-26 DIAGNOSIS — N1831 Chronic kidney disease, stage 3a: Secondary | ICD-10-CM | POA: Diagnosis not present

## 2019-08-26 DIAGNOSIS — Z794 Long term (current) use of insulin: Secondary | ICD-10-CM

## 2019-08-26 DIAGNOSIS — I252 Old myocardial infarction: Secondary | ICD-10-CM | POA: Diagnosis not present

## 2019-08-26 DIAGNOSIS — Z20828 Contact with and (suspected) exposure to other viral communicable diseases: Secondary | ICD-10-CM | POA: Insufficient documentation

## 2019-08-26 DIAGNOSIS — S0990XA Unspecified injury of head, initial encounter: Secondary | ICD-10-CM | POA: Diagnosis not present

## 2019-08-26 DIAGNOSIS — N3090 Cystitis, unspecified without hematuria: Secondary | ICD-10-CM

## 2019-08-26 DIAGNOSIS — E0865 Diabetes mellitus due to underlying condition with hyperglycemia: Secondary | ICD-10-CM

## 2019-08-26 DIAGNOSIS — R296 Repeated falls: Secondary | ICD-10-CM | POA: Diagnosis not present

## 2019-08-26 DIAGNOSIS — Z8541 Personal history of malignant neoplasm of cervix uteri: Secondary | ICD-10-CM | POA: Insufficient documentation

## 2019-08-26 DIAGNOSIS — Z87891 Personal history of nicotine dependence: Secondary | ICD-10-CM | POA: Diagnosis not present

## 2019-08-26 DIAGNOSIS — R52 Pain, unspecified: Secondary | ICD-10-CM | POA: Diagnosis not present

## 2019-08-26 DIAGNOSIS — M5489 Other dorsalgia: Secondary | ICD-10-CM | POA: Diagnosis not present

## 2019-08-26 LAB — URINALYSIS, COMPLETE (UACMP) WITH MICROSCOPIC
Bacteria, UA: NONE SEEN
Bilirubin Urine: NEGATIVE
Glucose, UA: >=500 mg/dL — AB
Ketones, ur: NEGATIVE mg/dL
Nitrite: NEGATIVE
Protein, ur: 30 mg/dL — AB
RBC / HPF: >50 RBC/hpf — ABNORMAL HIGH (ref 0–5)
Specific Gravity, Urine: 1.023 (ref 1.005–1.030)
WBC, UA: >50 WBC/hpf — ABNORMAL HIGH (ref 0–5)
pH: 7 (ref 5.0–8.0)

## 2019-08-26 LAB — BASIC METABOLIC PANEL
Anion gap: 9 (ref 5–15)
BUN: 13 mg/dL (ref 8–23)
CO2: 21 mmol/L — ABNORMAL LOW (ref 22–32)
Calcium: 8.3 mg/dL — ABNORMAL LOW (ref 8.9–10.3)
Chloride: 98 mmol/L (ref 98–111)
Creatinine, Ser: 1.1 mg/dL — ABNORMAL HIGH (ref 0.44–1.00)
GFR calc Af Amer: >60 mL/min (ref >60)
GFR calc non Af Amer: 53 mL/min — ABNORMAL LOW (ref >60)
Glucose, Bld: 435 mg/dL — ABNORMAL HIGH (ref 70–99)
Potassium: 4.4 mmol/L (ref 3.5–5.1)
Sodium: 128 mmol/L — ABNORMAL LOW (ref 135–145)

## 2019-08-26 LAB — CBC
HCT: 33.1 % — ABNORMAL LOW (ref 36.0–46.0)
Hemoglobin: 10.3 g/dL — ABNORMAL LOW (ref 12.0–15.0)
MCH: 26.6 pg (ref 26.0–34.0)
MCHC: 31.1 g/dL (ref 30.0–36.0)
MCV: 85.5 fL (ref 80.0–100.0)
Platelets: 122 10*3/uL — ABNORMAL LOW (ref 150–400)
RBC: 3.87 MIL/uL (ref 3.87–5.11)
RDW: 16.1 % — ABNORMAL HIGH (ref 11.5–15.5)
WBC: 5.3 10*3/uL (ref 4.0–10.5)
nRBC: 0 % (ref 0.0–0.2)

## 2019-08-26 LAB — GLUCOSE, CAPILLARY
Glucose-Capillary: 422 mg/dL — ABNORMAL HIGH (ref 70–99)
Glucose-Capillary: 313 mg/dL — ABNORMAL HIGH (ref 70–99)

## 2019-08-26 LAB — POC SARS CORONAVIRUS 2 AG: SARS Coronavirus 2 Ag: NEGATIVE

## 2019-08-26 LAB — TROPONIN I (HIGH SENSITIVITY): Troponin I (High Sensitivity): 9 ng/L (ref <18)

## 2019-08-26 MED ORDER — CEPHALEXIN 500 MG PO CAPS: 500.0000 mg | ORAL_CAPSULE | Freq: Two times a day (BID) | ORAL | 0 refills | Status: DC

## 2019-08-26 MED ORDER — KETOROLAC TROMETHAMINE 30 MG/ML IJ SOLN
15.0000 mg | INTRAMUSCULAR | Status: AC
  Administered 2019-08-26: 15 mg via INTRAVENOUS
  Filled 2019-08-26: qty 1

## 2019-08-26 MED ORDER — ONDANSETRON 4 MG PO TBDP: 4.0000 mg | ORAL_TABLET | Freq: Three times a day (TID) | ORAL | 0 refills | Status: DC | PRN

## 2019-08-26 MED ORDER — SODIUM CHLORIDE 0.9 % IV SOLN
1.0000 g | Freq: Once | INTRAVENOUS | Status: AC
  Administered 2019-08-26: 1 g via INTRAVENOUS
  Filled 2019-08-26: qty 10

## 2019-08-26 MED ORDER — SODIUM CHLORIDE 0.9 % IV BOLUS
1000.0000 mL | Freq: Once | INTRAVENOUS | Status: AC
  Administered 2019-08-26: 1000 mL via INTRAVENOUS

## 2019-08-26 NOTE — Telephone Encounter (Signed)
Noted, that seems appropriate.

## 2019-08-26 NOTE — ED Notes (Signed)
Pt given phone to call husband

## 2019-08-26 NOTE — ED Notes (Signed)
Pt unable to get ride at this time. Agricultural consultant notified.

## 2019-08-26 NOTE — Telephone Encounter (Signed)
Spoken to [facility] this morning. Notified her of Kate's comments. She told me that patient cancel due to weather.

## 2019-08-26 NOTE — Discharge Instructions (Signed)
Be sure to take your medicines every day and check your blood sugar regularly as instructed by your doctor.  Follow your diet plan.  Your test today show a urinary tract infection along with high blood sugar that has caused some degree of dehydration.  We gave you IV fluids and a dose of IV antibiotics, and you should continue oral antibiotics as prescribed for the next week.

## 2019-08-26 NOTE — ED Provider Notes (Signed)
[facility] Emergency Department Provider Note  ____________________________________________  Time seen: Approximately 8:01 PM  I have reviewed the triage vital signs and the nursing notes.   HISTORY  Chief Complaint Fever and Cough    HPI [name] is a 63 y.o. female with a history of diverticulitis hypertension paroxysmal A. fib stroke chronic pain and recurrent UTIs who comes the ED complaining of fever body ache shortness of breath weakness and malaise for the past month, worse in the past 3 days.  Also reports some central chest pain that is worse with movement and a nonproductive cough.  Blood sugar elevated at home to 500.  She also endorses urinary frequency and dysuria.  Denies sick contacts.    Past Medical History:  Diagnosis Date  . Acute encephalopathy 05/22/2018  . Allergy   . Anxiety   . Ascites   . C. difficile colitis 07/10/2015  . Cancer (HCC)    HX OF CANCER OF UTERUS   . Cirrhosis of liver not due to alcohol ([place]) 2016  . Degenerative disk disease   . Diverticulitis   . Gastroparesis   . GERD (gastroesophageal reflux disease)   . History of hiatal hernia   . Hypertension   . Hypothyroid   . Hypothyroidism due to amiodarone   . Ileus ([place]) 08/01/2015  . Intussusception intestine ([place]) 05/2015  . Orthostatic hypotension   . PAF (paroxysmal atrial fibrillation) ([place]) 03/2015   a. new onset 03/2015 in setting of intractable N/V; b. on Eliquis 5 mg bid; c. CHADSVASc 4 (DM, TIA x 2, female)  . Pancreatitis   . Pneumonia 11/14/2015  . Right ureteral stone 07/14/2016  . Sepsis ([place]) 12/15/2017  . Sick sinus syndrome ([place])   . Stomach ulcer   . Stroke [facility])    with minimal left sided weakness  . Syncope 01/2015  . Syncope due to orthostatic hypotension 05/18/2015  . Tachyarrhythmia 01/10/2016  . TIA (transient ischemic attack) 02/2015  . Type 1 diabetes ([place])    on levemir  . UTERINE CANCER, HX OF 03/27/2007   Qualifier: Diagnosis  of  By: Maxie Better FNP, Rosalita Levan   . UTI (urinary tract infection) 05/22/2018     Patient Active Problem List   Diagnosis Date Noted  . Acute pain of left foot 08/18/2019  . Type II diabetes mellitus with renal manifestations ([place]) 08/13/2019  . Weakness of both lower extremities 07/31/2019  . Altered mental status 07/14/2019  . GAD (generalized anxiety disorder) 06/11/2019  . Poor memory 06/11/2019  . Constipation 05/25/2019  . Acute medial meniscus tear of left knee 05/25/2019  . AKI (acute kidney injury) ([place]) 05/16/2019  . Closed fracture of left distal fibula 05/16/2019  . Sinus tachycardia 05/05/2019  . Congestive dilated cardiomyopathy ([place]) 04/14/2019  . CAD (coronary artery disease) 04/10/2019  . Closed fracture of lateral malleolus 04/10/2019  . Lumbar facet syndrome (Bilateral) (R>L) 03/09/2019  . Long term (current) use of anticoagulants 03/09/2019  . Unspecified cirrhosis of liver ([place]) 02/11/2019  . Hypoalbuminemia 02/11/2019  . Edema due to hypoalbuminemia 02/11/2019  . Lower extremity edema 01/26/2019  . Prolonged QT interval 12/17/2018  . Hypomagnesemia 12/17/2018  . Uncontrolled type 2 diabetes mellitus ([place]) 12/17/2018  . H/O TIA (transient ischemic attack) and stroke 12/17/2018  . Personal history of surgery to heart and great vessels, presenting hazards to health 12/17/2018  . DDD (degenerative disc disease), lumbar 12/17/2018  . Lumbar intervertebral disc displacement 12/17/2018  . Chronic musculoskeletal pain  12/17/2018  . Chronic generalized pain 12/17/2018  . Hyponatremia 12/17/2018  . Fibromyalgia syndrome 12/17/2018  . Other intervertebral disc displacement, lumbar region 12/17/2018  . Vitamin D deficiency 11/26/2018  . Elevated C-reactive protein (CRP) 11/26/2018  . Elevated sed rate 11/26/2018  . Chronic low back pain (Primary area of Pain) (Bilateral) (L>R) w/ sciatica (Bilateral) 11/25/2018  . Chronic lower extremity pain (Secondary Area  of Pain) (Bilateral) (L>R) 11/25/2018  . Chronic neck pain 11/25/2018  . Pharmacologic therapy 11/25/2018  . Disorder of skeletal system 11/25/2018  . Problems influencing health status 11/25/2018  . Renal mass 10/06/2018  . Chronic intermittent abdominal pain 09/25/2018  . Unspecified abdominal pain 09/25/2018  . Frequent falls 09/12/2018  . Orthostatic hypotension 08/18/2018  . Normochromic normocytic anemia 08/18/2018  . Acute renal failure superimposed on stage 3a chronic kidney disease ([place]) 08/18/2018  . History of hypothyroidism 08/14/2018  . Medical non-compliance 08/14/2018  . Diabetic ketoacidosis ([place]) 05/22/2018  . UTI (urinary tract infection) 05/22/2018  . NSTEMI (non-ST elevated myocardial infarction) ([place]) 01/11/2016  . Tachy-brady syndrome ([place]) 12/28/2015  . PAF (paroxysmal atrial fibrillation) ([place]) 11/24/2015  . Type 2 diabetes mellitus with neurologic complication ([place]) 50/27/7412  . S/P cholecystectomy 11/23/2015  . Narcotic abuse ([place]) 11/22/2015  . Chronic superficial gastritis without bleeding 11/22/2015  . History of Clostridium difficile 11/22/2015  . History of TIA (transient ischemic attack) 11/22/2015  . Mixed hyperlipidemia 11/22/2015  . Diverticulosis 11/22/2015  . History of uterine cancer 11/22/2015  . Hypothyroidism, unspecified 11/22/2015  . Intractable cyclical vomiting with nausea 11/22/2015  . Narcotic withdrawal ([place]) 11/11/2015  . Hyperlipidemia 08/31/2015  . Hypotension 08/06/2015  . Cryptogenic cirrhosis ([place]) 07/10/2015  . Atrial fibrillation ([place]) 07/10/2015  . Malnutrition of moderate degree 07/04/2015  . Symptomatic bradycardia 05/27/2015  . Chronic anemia 05/19/2015  . Thrombocytopenia ([place]) 05/19/2015  . Hypokalemia 04/06/2015  . Hyperlipidemia with target LDL less than 100 04/06/2015  . Syncope 03/11/2015  . CVA (cerebral vascular accident) ([place]) 02/15/2015  . Essential hypertension 01/12/2015  . Chronically on opiate  therapy 01/12/2015  . Cirrhosis of liver not due to alcohol ([place]) 2016  . S/P Nissen fundoplication (without gastrostomy tube) procedure 05/11/2014  . Dysphagia 04/19/2014  . DEPRESSION/ANXIETY 06/27/2007  . Myofascial pain syndrome 06/27/2007  . Chronic pain syndrome 03/28/2007  . GERD 03/27/2007  . Diverticulosis of large intestine without perforation or abscess without bleeding 03/27/2007  . Proteinuria 03/27/2007     Past Surgical History:  Procedure Laterality Date  . ABDOMINAL HYSTERECTOMY    . CARDIAC CATHETERIZATION N/A 01/12/2016   Procedure: Left Heart Cath and Coronary Angiography;  Surgeon: Wellington Hampshire, MD;  Location: [facility];  Service: Cardiovascular;  Laterality: N/A;  . CHOLECYSTECTOMY    . CYSTOSCOPY/URETEROSCOPY/HOLMIUM LASER Right 07/14/2016   Procedure: CYSTOSCOPY/URETEROSCOPY/HOLMIUM LASER;  Surgeon: Alexis Frock, MD;  Location: [facility] ORS;  Service: Urology;  Laterality: Right;  . ESOPHAGOGASTRODUODENOSCOPY N/A 04/04/2015   Procedure: ESOPHAGOGASTRODUODENOSCOPY (EGD);  Surgeon: Hulen Luster, MD;  Location: [facility] ENDOSCOPY;  Service: Endoscopy;  Laterality: N/A;  . ESOPHAGOGASTRODUODENOSCOPY N/A 12/28/2017   Procedure: ESOPHAGOGASTRODUODENOSCOPY (EGD);  Surgeon: Lin Landsman, MD;  Location[facility]COPY;  Service: Gastroenterology;  Laterality: N/A;  . ESOPHAGOGASTRODUODENOSCOPY (EGD) WITH PROPOFOL N/A 01/18/2016   Procedure: ESOPHAGOGASTRODUODENOSCOPY (EGD) WITH PROPOFOL;  Surgeon: Lucilla Lame, MD;  Location: [facility] ENDOSCOPY;  Service: Endoscopy;  Laterality: N/A;  . FLEXIBLE SIGMOIDOSCOPY N/A 01/18/2016   Procedure: FLEXIBLE SIGMOIDOSCOPY;  Surgeon: Lucilla Lame, MD;  Location: [facility]  ENDOSCOPY;  Service: Endoscopy;  Laterality: N/A;  . HERNIA REPAIR       Prior to Admission medications   Medication Sig Start Date End Date Taking? Authorizing Provider  albuterol (PROVENTIL HFA;VENTOLIN HFA) 108 (90 Base) MCG/ACT inhaler Inhale 2 puffs into the  lungs every 6 (six) hours as needed for wheezing or shortness of breath. 11/27/18  Yes Epifanio Lesches, MD  amoxicillin-clavulanate (AUGMENTIN) 875-125 MG tablet Take 1 tablet by mouth every 12 (twelve) hours. 08/24/19  Yes Billey Co, MD  Insulin Detemir (LEVEMIR) 100 UNIT/ML Pen Inject 30 units in the morning and 30 units at bedtime. Patient taking differently: Inject 30 Units into the skin 2 (two) times daily.  02/09/19  Yes [name], NP  metoprolol tartrate (LOPRESSOR) 25 MG tablet Take 1 tablet (25 mg total) by mouth 2 (two) times daily. 07/29/19 10/27/19 Yes Gollan, Kathlene November, MD  midodrine (PROAMATINE) 5 MG tablet Take 1 tablet (5 mg total) by mouth 3 (three) times daily with meals. 07/29/19  Yes Gollan, Kathlene November, MD  nitroGLYCERIN (NITROSTAT) 0.3 MG SL tablet Place 1 tablet (0.3 mg total) under the tongue every 5 (five) minutes as needed for chest pain. 07/24/19 07/23/20 Yes Nena Polio, MD  sertraline (ZOLOFT) 25 MG tablet TAKE 1 TABLET AT BEDTIME FOR ANXIETY Patient taking differently: Take 25 mg by mouth at bedtime.  07/31/19  Yes [name], NP  cephALEXin (KEFLEX) 500 MG capsule Take 1 capsule (500 mg total) by mouth 2 (two) times daily. 08/26/19   Carrie Mew, MD  ondansetron (ZOFRAN ODT) 4 MG disintegrating tablet Take 1 tablet (4 mg total) by mouth every 8 (eight) hours as needed for nausea or vomiting. 08/26/19   Carrie Mew, MD     Allergies Aspirin, Codeine sulfate, Erythromycin, Prednisone, Rosiglitazone maleate, and Tetanus-diphtheria toxoids td   Family History  Problem Relation Age of Onset  . Hypertension Mother   . CAD Sister   . Heart attack Sister        Deceased 11-17-2014  . CAD Brother     Social History Social History   Tobacco Use  . Smoking status: Former Smoker    Types: Cigarettes  . Smokeless tobacco: Never Used  . Tobacco comment: 25 years ago and only smoked occasionally  Substance Use Topics  . Alcohol  use: No  . Drug use: No    Review of Systems  Constitutional:   Positive fever chills.  ENT:   No sore throat. No rhinorrhea. Cardiovascular:   Positive chest pain without syncope. Respiratory:   Positive shortness of breath and nonproductive cough. Gastrointestinal:   Negative for abdominal pain, vomiting and diarrhea.  Musculoskeletal: Positive diffuse body aches All other systems reviewed and are negative except as documented above in ROS and HPI.  ____________________________________________   PHYSICAL EXAM:  VITAL SIGNS: ED Triage Vitals  Enc Vitals Group     BP 08/26/19 1616 (!) 150/94     Pulse Rate 08/26/19 1616 (!) 127     Resp 08/26/19 1616 18     Temp 08/26/19 1616 99.3 F (37.4 C)     Temp Source 08/26/19 1616 Oral     SpO2 08/26/19 1616 99 %     Weight 08/26/19 1617 150 lb (68 kg)     Height 08/26/19 1617 5' 3"  (1.6 m)     Head Circumference --      Peak Flow --      Pain Score 08/26/19 1617 9  Pain Loc --      Pain Edu? --      Excl. in [place]? --     Vital signs reviewed, nursing assessments reviewed.   Constitutional:   Alert and oriented. Non-toxic appearance. Eyes:   Conjunctivae are normal. EOMI. PERRL. ENT      Head:   Normocephalic and atraumatic.      Nose:   Wearing a mask.      Mouth/Throat:   Wearing a mask.      Neck:   No meningismus. Full ROM. Hematological/Lymphatic/Immunilogical:   No cervical lymphadenopathy. Cardiovascular:   Tachycardia heart rate 115. Symmetric bilateral radial and DP pulses.  No murmurs. Cap refill less than 2 seconds. Respiratory:   Normal respiratory effort without tachypnea/retractions. Breath sounds are clear and equal bilaterally. No wheezes/rales/rhonchi. Gastrointestinal:   Soft with suprapubic tenderness. Non distended. There is no CVA tenderness.  No rebound, rigidity, or guarding.  Musculoskeletal:   Normal range of motion in all extremities. No joint effusions.  No lower extremity tenderness.  No  edema.  Chest wall tender over the sternum reproducing her pain. Neurologic:   Normal speech and language.  Motor grossly intact. No acute focal neurologic deficits are appreciated.  Skin:    Skin is warm, dry and intact. No rash noted.  No petechiae, purpura, or bullae.  ____________________________________________    LABS (pertinent positives/negatives) (all labs ordered are listed, but only abnormal results are displayed) Labs Reviewed  GLUCOSE, CAPILLARY - Abnormal; Notable for the following components:      Result Value   Glucose-Capillary 422 (*)    All other components within normal limits  BASIC METABOLIC PANEL - Abnormal; Notable for the following components:   Sodium 128 (*)    CO2 21 (*)    Glucose, Bld 435 (*)    Creatinine, Ser 1.10 (*)    Calcium 8.3 (*)    GFR calc non Af Amer 53 (*)    All other components within normal limits  CBC - Abnormal; Notable for the following components:   Hemoglobin 10.3 (*)    HCT 33.1 (*)    RDW 16.1 (*)    Platelets 122 (*)    All other components within normal limits  URINALYSIS, COMPLETE (UACMP) WITH MICROSCOPIC - Abnormal; Notable for the following components:   Color, Urine YELLOW (*)    APPearance HAZY (*)    Glucose, UA >=500 (*)    Hgb urine dipstick LARGE (*)    Protein, ur 30 (*)    Leukocytes,Ua LARGE (*)    RBC / HPF >50 (*)    WBC, UA >50 (*)    All other components within normal limits  URINE CULTURE  POC SARS CORONAVIRUS 2 AG -  ED  POC SARS CORONAVIRUS 2 AG  TROPONIN I (HIGH SENSITIVITY)   ____________________________________________   EKG  Interpreted by me Sinus tachycardia rate 124, normal axis intervals ST segments and T waves.  Poor R wave progression.  Voltage criteria for LVH in the high lateral leads.  No acute ischemic changes  ____________________________________________    RADIOLOGY  DG Chest 2 View  Result Date: 08/26/2019 CLINICAL DATA:  Tachycardia, fever EXAM: CHEST - 2 VIEW  COMPARISON:  07/24/2019 FINDINGS: The heart size and mediastinal contours are within normal limits. Calcific aortic knob. Both lungs are clear. The visualized skeletal structures are unremarkable. IMPRESSION: No active cardiopulmonary disease. Electronically Signed   By: Davina Poke M.D.   On: 08/26/2019 16:39  CT Head Wo Contrast  Result Date: 08/26/2019 CLINICAL DATA:  Multiple falls.  Head trauma. EXAM: CT HEAD WITHOUT CONTRAST TECHNIQUE: Contiguous axial images were obtained from the base of the skull through the vertex without intravenous contrast. COMPARISON:  08/13/2019 FINDINGS: Brain: Mild age related volume loss. No acute intracranial abnormality. Specifically, no hemorrhage, hydrocephalus, mass lesion, acute infarction, or significant intracranial injury. Vascular: No hyperdense vessel or unexpected calcification. Skull: No acute calvarial abnormality. Sinuses/Orbits: Visualized paranasal sinuses and mastoids clear. Orbital soft tissues unremarkable. Other: None IMPRESSION: No acute intracranial abnormality. Electronically Signed   By: Rolm Baptise M.D.   On: 08/26/2019 18:43    ____________________________________________   PROCEDURES Procedures  ____________________________________________  DIFFERENTIAL DIAGNOSIS   Intracranial hemorrhage/subdural hematoma, dehydration, electrolyte abnormality, COVID-19, influenza-like illness, medication noncompliance, UTI, pneumonia  CLINICAL IMPRESSION / ASSESSMENT AND PLAN / ED COURSE  Medications ordered in the ED: Medications  cefTRIAXone (ROCEPHIN) 1 g in sodium chloride 0.9 % 100 mL IVPB (1 g Intravenous New Bag/Given 08/26/19 1945)  sodium chloride 0.9 % bolus 1,000 mL (1,000 mLs Intravenous New Bag/Given 08/26/19 1922)  ketorolac (TORADOL) 30 MG/ML injection 15 mg (15 mg Intravenous Given 08/26/19 1942)    Pertinent labs & imaging results that were available during my care of the patient were reviewed by me and considered in  my medical decision making (see chart for details).  [name] was evaluated in Emergency Department on 08/26/2019 for the symptoms described in the history of present illness. She was evaluated in the context of the global COVID-19 pandemic, which necessitated consideration that the patient might be at risk for infection with the SARS-CoV-2 virus that causes COVID-19. Institutional protocols and algorithms that pertain to the evaluation of patients at risk for COVID-19 are in a state of rapid change based on information released by regulatory bodies including the CDC and federal and state organizations. These policies and algorithms were followed during the patient's care in the ED.   Patient presents with constellation of symptoms consistent with a influenza-like illness, possibly COVID-19.  Hyperglycemia and clinically some dehydration as a result which is likely exacerbating her symptoms.  I give her IV fluids, check urinalysis Covid test chest x-ray.  Doubt ACS PE dissection AAA pneumothorax pericarditis.  Abdomen is benign and nonsurgical.  Unlikely meningitis encephalitis or intracranial hypertension.  ----------------------------------------- 8:06 PM on 08/26/2019 -----------------------------------------  Labs reveal UTI.  Prior microbiology data reviewed which shows recurrent strep agalactiae infections.  This should be well treated by beta-lactam antibiotics.  Reviewed outpatient visit notes, noted last visit with her primary care doctor was August 18, 2019.  She is getting home health physical therapy.  She had tachycardia in clinic that day to so today's tachycardia is not new, and improved after giving IV fluids.  It seems that medication noncompliance, dehydration, and hyperglycemia are chronic issues for the patient.  Her pain complaints may also be chronic.  Her doctor is managing the ambulatory dysfunction.  Patient given a dose of IV ceftriaxone, IV fluids.  Vital signs are  stable, tachycardia improving.  Stable for discharge home to complete a course of Keflex and follow-up with her doctor.      ____________________________________________   FINAL CLINICAL IMPRESSION(S) / ED DIAGNOSES    Final diagnoses:  Cystitis  Diabetes mellitus due to underlying condition with hyperglycemia, with long-term current use of insulin [facility])  Ambulatory dysfunction     ED Discharge Orders         Ordered  cephALEXin (KEFLEX) 500 MG capsule  2 times daily     08/26/19 2001    ondansetron (ZOFRAN ODT) 4 MG disintegrating tablet  Every 8 hours PRN     08/26/19 2001          Portions of this note were generated with dragon dictation software. Dictation errors may occur despite best attempts at proofreading.   Carrie Mew, MD 08/26/19 2007

## 2019-08-26 NOTE — ED Triage Notes (Signed)
Patient arrived by [facility] EMS from home with c/o fever, body aches, sob, racing heart, and multiple falls for a month. HX A-fib,non alcoholic cirrhosis of liver. Urinary stent removed Monday and since has been c/o painful urination and light brown urine. Blood sugar 508, 169/97b/p, 99%RA with EMS.

## 2019-08-27 LAB — URINE CULTURE: Culture: NO GROWTH

## 2019-08-28 ENCOUNTER — Telehealth: Payer: Self-pay

## 2019-08-28 NOTE — Telephone Encounter (Signed)
Gave the approval for the verbal orders 

## 2019-08-28 NOTE — Telephone Encounter (Signed)
Samantha PTA with [facility] left v/m that pt has fallen x 3 since 08/25/19,on 08/26/19 pt was seen in ED (not admitted) due to elevated BS > 500,dehydration and UTI. Samantha PTA request verbal order for referral for [facility] nurse to help with pt's  medication and educate pt on how to use the glucose meter correctly.

## 2019-08-28 NOTE — Telephone Encounter (Signed)
Approved.  

## 2019-08-29 ENCOUNTER — Other Ambulatory Visit: Payer: Self-pay | Admitting: Family Medicine

## 2019-08-31 ENCOUNTER — Telehealth: Payer: Self-pay

## 2019-08-31 ENCOUNTER — Telehealth: Payer: Self-pay | Admitting: Family Medicine

## 2019-08-31 NOTE — Telephone Encounter (Signed)
[name] PTA with [facility] left v/m that pt fell x 2 over weekend which makes a total number of falls for the week at 5. All appeared to be minor falls. Today pts BP 152/100 which is fairly common for pt; [name] said on pts metoprolol bottle it says take 1/2 tab but [facility] records has 1 pill and pts med list has metoprolol 25 mg taking 1 tab po bid. [name] wants to confirm how pt is to take Metoprolol 25 mg.

## 2019-08-31 NOTE — Telephone Encounter (Signed)
Rec'd records from [facility] forwarded 4 pages to Dr. Diona Browner [name]

## 2019-08-31 NOTE — Telephone Encounter (Signed)
Please notify [name], the cardiologist has it written for twice daily. She is also on midodrine which is used to prevent low blood pressure and to prevent vasovagal passing out. This may be counteracting at times. This is also prescribed by cardiology.

## 2019-08-31 NOTE — Telephone Encounter (Signed)
Spoken and notified patient of Samantha's comments. She verbalized understanding.

## 2019-09-02 ENCOUNTER — Telehealth: Payer: Self-pay

## 2019-09-02 NOTE — Telephone Encounter (Signed)
Patient left message on triage line stating that she had a visit with her physical therapist and there were some questions about her medications that she had after this visit.   I called and left message for patient to call back to discuss more in detail.

## 2019-09-03 NOTE — Telephone Encounter (Signed)
I left v/m for pt to cb to [facility] prior to 12 noon today.FYI to [place] and myself.

## 2019-09-08 NOTE — Telephone Encounter (Signed)
See 08/31/19 phone note.

## 2019-09-10 ENCOUNTER — Other Ambulatory Visit: Payer: Self-pay | Admitting: Pain Medicine

## 2019-09-10 DIAGNOSIS — G894 Chronic pain syndrome: Secondary | ICD-10-CM

## 2019-09-17 ENCOUNTER — Telehealth: Payer: Self-pay

## 2019-09-17 NOTE — Telephone Encounter (Signed)
Mardene Celeste nurse with [facility] left v/m requesting verbal orders for [facility] nursing 1 x a wk for 3 wks for med mgt and diabetic education. [name] NP out of office this afternoon; FYI to Avie Echevaria NP.

## 2019-09-17 NOTE — Telephone Encounter (Signed)
Ok for [facility] orders as requested

## 2019-09-18 NOTE — Telephone Encounter (Signed)
Gave the approval for the verbal orders 

## 2019-09-21 ENCOUNTER — Telehealth: Payer: Self-pay | Admitting: Cardiovascular Disease

## 2019-09-21 ENCOUNTER — Ambulatory Visit: Payer: Medicare Other | Admitting: Primary Care

## 2019-09-21 NOTE — Telephone Encounter (Signed)
Pt c/o BP issue: STAT if pt c/o blurred vision, one-sided weakness or slurred speech  1. What are your last 5 BP readings? 176/98 taken this morning  2. Are you having any other symptoms (ex. Dizziness, headache, blurred vision, passed out)? asymptomatic  3. What is your BP issue? High after taking vasopressor (midodrine 5 mg)   Home health nurse calling in to report high BP for patient. If calling patient back ask to speak with husband

## 2019-09-22 NOTE — Telephone Encounter (Signed)
Left voicemail message for nurse to call back.

## 2019-09-23 NOTE — Telephone Encounter (Signed)
Spoke with patient and she reports that she is still swollen, chest pain, and coughing. She took 2 nitro last night and then took another this morning. She reports chest pounding with the pain and shortness of breath. Based on her taking multiple nitroglycerin advised she go to ED for further evaluation. She verbalized understanding and was agreeable to proceed there. She had no further questions at this time.

## 2019-09-24 NOTE — Telephone Encounter (Signed)
Multiple medical issues going on Anorexia prior alcohol, cirrhosis Does have atrial fibrillation but not seen since 2016 History of sinus tachycardia managed with metoprolol CT scan no significant coronary calcifications, low risk for ischemia Seen in the 2020 for similar symptoms, atypical in nature, musculoskeletal

## 2019-09-24 NOTE — Telephone Encounter (Signed)
Spoke with patient and she did not go to ED. Reviewed provider recommendations and she was reassured and states that she did not take any nitro today. She reports feeling much better with no further questions at this time.

## 2019-09-28 ENCOUNTER — Telehealth: Payer: Self-pay | Admitting: *Deleted

## 2019-09-28 NOTE — Telephone Encounter (Signed)
Patient called stating that this is the ninth day that she has had diarrhea. Patient stated that she started with a cough about two weeks ago. Patient stated that she has a headache, dizziness, and is real tired. Patient is not sure if she has a fever or not. Patient stated that her mouth is real dry and she feels that she may be dehydrated. Patient was advised that she needs to be seen, but unfortunately we can not bring her into the office with her symptoms. Patient was given information on the [facility]. Patient stated that she will get her caregiver to take her to the Urgent Care today for treatment.

## 2019-09-28 NOTE — Telephone Encounter (Signed)
Noted  

## 2019-09-28 NOTE — Telephone Encounter (Signed)
Noted and agree. 

## 2019-09-30 ENCOUNTER — Telehealth: Payer: Self-pay | Admitting: Primary Care

## 2019-09-30 ENCOUNTER — Inpatient Hospital Stay
Admission: EM | Admit: 2019-09-30 | Discharge: 2019-10-03 | DRG: 690 | Disposition: A | Discharge status: Home Health | Payer: Medicare Other | Attending: Hospitalist | Admitting: Hospitalist

## 2019-09-30 ENCOUNTER — Ambulatory Visit: Payer: Medicare Other

## 2019-09-30 ENCOUNTER — Emergency Department: Payer: Medicare Other

## 2019-09-30 ENCOUNTER — Other Ambulatory Visit: Payer: Self-pay

## 2019-09-30 ENCOUNTER — Encounter: Payer: Self-pay | Admitting: Emergency Medicine

## 2019-09-30 DIAGNOSIS — E1043 Type 1 diabetes mellitus with diabetic autonomic (poly)neuropathy: Secondary | ICD-10-CM

## 2019-09-30 DIAGNOSIS — Z8542 Personal history of malignant neoplasm of other parts of uterus: Secondary | ICD-10-CM

## 2019-09-30 DIAGNOSIS — Z881 Allergy status to other antibiotic agents status: Secondary | ICD-10-CM

## 2019-09-30 DIAGNOSIS — Z87442 Personal history of urinary calculi: Secondary | ICD-10-CM

## 2019-09-30 DIAGNOSIS — N12 Tubulo-interstitial nephritis, not specified as acute or chronic: Secondary | ICD-10-CM | POA: Diagnosis not present

## 2019-09-30 DIAGNOSIS — M4726 Other spondylosis with radiculopathy, lumbar region: Secondary | ICD-10-CM | POA: Diagnosis present

## 2019-09-30 DIAGNOSIS — T462X5S Adverse effect of other antidysrhythmic drugs, sequela: Secondary | ICD-10-CM

## 2019-09-30 DIAGNOSIS — F411 Generalized anxiety disorder: Secondary | ICD-10-CM

## 2019-09-30 DIAGNOSIS — G894 Chronic pain syndrome: Secondary | ICD-10-CM | POA: Diagnosis present

## 2019-09-30 DIAGNOSIS — M5116 Intervertebral disc disorders with radiculopathy, lumbar region: Secondary | ICD-10-CM | POA: Diagnosis present

## 2019-09-30 DIAGNOSIS — E1165 Type 2 diabetes mellitus with hyperglycemia: Secondary | ICD-10-CM | POA: Diagnosis not present

## 2019-09-30 DIAGNOSIS — E1122 Type 2 diabetes mellitus with diabetic chronic kidney disease: Secondary | ICD-10-CM | POA: Diagnosis present

## 2019-09-30 DIAGNOSIS — I482 Chronic atrial fibrillation, unspecified: Secondary | ICD-10-CM | POA: Diagnosis present

## 2019-09-30 DIAGNOSIS — N10 Acute tubulo-interstitial nephritis: Secondary | ICD-10-CM

## 2019-09-30 DIAGNOSIS — I252 Old myocardial infarction: Secondary | ICD-10-CM | POA: Diagnosis not present

## 2019-09-30 DIAGNOSIS — F329 Major depressive disorder, single episode, unspecified: Secondary | ICD-10-CM | POA: Diagnosis present

## 2019-09-30 DIAGNOSIS — M797 Fibromyalgia: Secondary | ICD-10-CM | POA: Diagnosis present

## 2019-09-30 DIAGNOSIS — Z66 Do not resuscitate: Secondary | ICD-10-CM | POA: Diagnosis present

## 2019-09-30 DIAGNOSIS — K3184 Gastroparesis: Secondary | ICD-10-CM

## 2019-09-30 DIAGNOSIS — I69354 Hemiplegia and hemiparesis following cerebral infarction affecting left non-dominant side: Secondary | ICD-10-CM

## 2019-09-30 DIAGNOSIS — Z8719 Personal history of other diseases of the digestive system: Secondary | ICD-10-CM

## 2019-09-30 DIAGNOSIS — I639 Cerebral infarction, unspecified: Secondary | ICD-10-CM | POA: Diagnosis present

## 2019-09-30 DIAGNOSIS — R296 Repeated falls: Secondary | ICD-10-CM | POA: Diagnosis present

## 2019-09-30 DIAGNOSIS — K7460 Unspecified cirrhosis of liver: Secondary | ICD-10-CM | POA: Diagnosis not present

## 2019-09-30 DIAGNOSIS — I10 Essential (primary) hypertension: Secondary | ICD-10-CM | POA: Diagnosis present

## 2019-09-30 DIAGNOSIS — E782 Mixed hyperlipidemia: Secondary | ICD-10-CM | POA: Diagnosis present

## 2019-09-30 DIAGNOSIS — K219 Gastro-esophageal reflux disease without esophagitis: Secondary | ICD-10-CM | POA: Diagnosis present

## 2019-09-30 DIAGNOSIS — E785 Hyperlipidemia, unspecified: Secondary | ICD-10-CM | POA: Diagnosis present

## 2019-09-30 DIAGNOSIS — M5489 Other dorsalgia: Secondary | ICD-10-CM | POA: Diagnosis not present

## 2019-09-30 DIAGNOSIS — R188 Other ascites: Secondary | ICD-10-CM

## 2019-09-30 DIAGNOSIS — I129 Hypertensive chronic kidney disease with stage 1 through stage 4 chronic kidney disease, or unspecified chronic kidney disease: Secondary | ICD-10-CM | POA: Diagnosis present

## 2019-09-30 DIAGNOSIS — M545 Low back pain: Secondary | ICD-10-CM | POA: Diagnosis not present

## 2019-09-30 DIAGNOSIS — Z886 Allergy status to analgesic agent status: Secondary | ICD-10-CM

## 2019-09-30 DIAGNOSIS — Z9049 Acquired absence of other specified parts of digestive tract: Secondary | ICD-10-CM | POA: Diagnosis not present

## 2019-09-30 DIAGNOSIS — K7469 Other cirrhosis of liver: Secondary | ICD-10-CM | POA: Diagnosis present

## 2019-09-30 DIAGNOSIS — E1149 Type 2 diabetes mellitus with other diabetic neurological complication: Secondary | ICD-10-CM | POA: Diagnosis present

## 2019-09-30 DIAGNOSIS — F341 Dysthymic disorder: Secondary | ICD-10-CM | POA: Diagnosis present

## 2019-09-30 DIAGNOSIS — Z8701 Personal history of pneumonia (recurrent): Secondary | ICD-10-CM

## 2019-09-30 DIAGNOSIS — Z887 Allergy status to serum and vaccine status: Secondary | ICD-10-CM

## 2019-09-30 DIAGNOSIS — Z8249 Family history of ischemic heart disease and other diseases of the circulatory system: Secondary | ICD-10-CM

## 2019-09-30 DIAGNOSIS — E1022 Type 1 diabetes mellitus with diabetic chronic kidney disease: Secondary | ICD-10-CM

## 2019-09-30 DIAGNOSIS — Z794 Long term (current) use of insulin: Secondary | ICD-10-CM

## 2019-09-30 DIAGNOSIS — Z20822 Contact with and (suspected) exposure to covid-19: Secondary | ICD-10-CM | POA: Diagnosis not present

## 2019-09-30 DIAGNOSIS — Z885 Allergy status to narcotic agent status: Secondary | ICD-10-CM

## 2019-09-30 DIAGNOSIS — I480 Paroxysmal atrial fibrillation: Secondary | ICD-10-CM

## 2019-09-30 DIAGNOSIS — R52 Pain, unspecified: Secondary | ICD-10-CM | POA: Diagnosis not present

## 2019-09-30 DIAGNOSIS — F1110 Opioid abuse, uncomplicated: Secondary | ICD-10-CM | POA: Diagnosis present

## 2019-09-30 DIAGNOSIS — G8220 Paraplegia, unspecified: Secondary | ICD-10-CM

## 2019-09-30 DIAGNOSIS — E876 Hypokalemia: Secondary | ICD-10-CM | POA: Diagnosis present

## 2019-09-30 DIAGNOSIS — M5136 Other intervertebral disc degeneration, lumbar region: Secondary | ICD-10-CM

## 2019-09-30 DIAGNOSIS — Z8619 Personal history of other infectious and parasitic diseases: Secondary | ICD-10-CM

## 2019-09-30 DIAGNOSIS — R531 Weakness: Secondary | ICD-10-CM

## 2019-09-30 DIAGNOSIS — I4891 Unspecified atrial fibrillation: Secondary | ICD-10-CM | POA: Diagnosis present

## 2019-09-30 DIAGNOSIS — F419 Anxiety disorder, unspecified: Secondary | ICD-10-CM | POA: Diagnosis present

## 2019-09-30 DIAGNOSIS — R000 Tachycardia, unspecified: Secondary | ICD-10-CM | POA: Diagnosis not present

## 2019-09-30 DIAGNOSIS — I495 Sick sinus syndrome: Secondary | ICD-10-CM

## 2019-09-30 DIAGNOSIS — Z8673 Personal history of transient ischemic attack (TIA), and cerebral infarction without residual deficits: Secondary | ICD-10-CM

## 2019-09-30 DIAGNOSIS — E039 Hypothyroidism, unspecified: Secondary | ICD-10-CM | POA: Diagnosis present

## 2019-09-30 DIAGNOSIS — Z90710 Acquired absence of both cervix and uterus: Secondary | ICD-10-CM

## 2019-09-30 DIAGNOSIS — Z87891 Personal history of nicotine dependence: Secondary | ICD-10-CM

## 2019-09-30 DIAGNOSIS — N1832 Chronic kidney disease, stage 3b: Secondary | ICD-10-CM | POA: Diagnosis present

## 2019-09-30 DIAGNOSIS — Z9181 History of falling: Secondary | ICD-10-CM

## 2019-09-30 DIAGNOSIS — E860 Dehydration: Secondary | ICD-10-CM

## 2019-09-30 DIAGNOSIS — E032 Hypothyroidism due to medicaments and other exogenous substances: Secondary | ICD-10-CM

## 2019-09-30 DIAGNOSIS — Z888 Allergy status to other drugs, medicaments and biological substances status: Secondary | ICD-10-CM

## 2019-09-30 DIAGNOSIS — Z79899 Other long term (current) drug therapy: Secondary | ICD-10-CM

## 2019-09-30 DIAGNOSIS — N136 Pyonephrosis: Principal | ICD-10-CM | POA: Diagnosis present

## 2019-09-30 DIAGNOSIS — Z87440 Personal history of urinary (tract) infections: Secondary | ICD-10-CM

## 2019-09-30 DIAGNOSIS — I2510 Atherosclerotic heart disease of native coronary artery without angina pectoris: Secondary | ICD-10-CM | POA: Diagnosis present

## 2019-09-30 DIAGNOSIS — Z8639 Personal history of other endocrine, nutritional and metabolic disease: Secondary | ICD-10-CM

## 2019-09-30 DIAGNOSIS — I951 Orthostatic hypotension: Secondary | ICD-10-CM

## 2019-09-30 DIAGNOSIS — N189 Chronic kidney disease, unspecified: Secondary | ICD-10-CM

## 2019-09-30 DIAGNOSIS — Z79891 Long term (current) use of opiate analgesic: Secondary | ICD-10-CM

## 2019-09-30 LAB — COMPREHENSIVE METABOLIC PANEL
ALT: 28 U/L (ref 0–44)
AST: 34 U/L (ref 15–41)
Albumin: 3.5 g/dL (ref 3.5–5.0)
Alkaline Phosphatase: 131 U/L — ABNORMAL HIGH (ref 38–126)
Anion gap: 10 (ref 5–15)
BUN: 26 mg/dL — ABNORMAL HIGH (ref 8–23)
CO2: 17 mmol/L — ABNORMAL LOW (ref 22–32)
Calcium: 8.8 mg/dL — ABNORMAL LOW (ref 8.9–10.3)
Chloride: 108 mmol/L (ref 98–111)
Creatinine, Ser: 1.45 mg/dL — ABNORMAL HIGH (ref 0.44–1.00)
GFR calc Af Amer: 44 mL/min — ABNORMAL LOW (ref >60)
GFR calc non Af Amer: 38 mL/min — ABNORMAL LOW (ref >60)
Glucose, Bld: 416 mg/dL — ABNORMAL HIGH (ref 70–99)
Potassium: 3.2 mmol/L — ABNORMAL LOW (ref 3.5–5.1)
Sodium: 135 mmol/L (ref 135–145)
Total Bilirubin: 1.1 mg/dL (ref 0.3–1.2)
Total Protein: 7.5 g/dL (ref 6.5–8.1)

## 2019-09-30 LAB — URINALYSIS, COMPLETE (UACMP) WITH MICROSCOPIC
Bacteria, UA: NONE SEEN
Bilirubin Urine: NEGATIVE
Glucose, UA: >=500 mg/dL — AB
Ketones, ur: NEGATIVE mg/dL
Nitrite: NEGATIVE
Protein, ur: 100 mg/dL — AB
RBC / HPF: >50 RBC/hpf — ABNORMAL HIGH (ref 0–5)
Specific Gravity, Urine: 1.018 (ref 1.005–1.030)
WBC, UA: >50 WBC/hpf — ABNORMAL HIGH (ref 0–5)
pH: 6 (ref 5.0–8.0)

## 2019-09-30 LAB — POC SARS CORONAVIRUS 2 AG: SARS Coronavirus 2 Ag: NEGATIVE

## 2019-09-30 LAB — CBC
HCT: 33.9 % — ABNORMAL LOW (ref 36.0–46.0)
Hemoglobin: 10.4 g/dL — ABNORMAL LOW (ref 12.0–15.0)
MCH: 26 pg (ref 26.0–34.0)
MCHC: 30.7 g/dL (ref 30.0–36.0)
MCV: 84.8 fL (ref 80.0–100.0)
Platelets: 183 10*3/uL (ref 150–400)
RBC: 4 MIL/uL (ref 3.87–5.11)
RDW: 15.8 % — ABNORMAL HIGH (ref 11.5–15.5)
WBC: 4.9 10*3/uL (ref 4.0–10.5)
nRBC: 0 % (ref 0.0–0.2)

## 2019-09-30 LAB — GLUCOSE, CAPILLARY: Glucose-Capillary: 363 mg/dL — ABNORMAL HIGH (ref 70–99)

## 2019-09-30 LAB — LIPASE, BLOOD: Lipase: 16 U/L (ref 11–51)

## 2019-09-30 MED ORDER — MORPHINE SULFATE (PF) 2 MG/ML IV SOLN
2.0000 mg | INTRAVENOUS | Status: DC | PRN
  Administered 2019-10-01 (×4): 2 mg via INTRAVENOUS
  Filled 2019-09-30 (×4): qty 1

## 2019-09-30 MED ORDER — SODIUM CHLORIDE 0.9 % IV SOLN: 250.0000 mL | INTRAVENOUS | Status: DC | PRN

## 2019-09-30 MED ORDER — DOCUSATE SODIUM 100 MG PO CAPS
100.0000 mg | ORAL_CAPSULE | Freq: Two times a day (BID) | ORAL | Status: DC
  Administered 2019-10-02: 100 mg via ORAL
  Filled 2019-09-30 (×4): qty 1

## 2019-09-30 MED ORDER — INSULIN ASPART 100 UNIT/ML ~~LOC~~ SOLN
0.0000 [IU] | Freq: Three times a day (TID) | SUBCUTANEOUS | Status: DC
  Administered 2019-10-01: 11 [IU] via SUBCUTANEOUS
  Administered 2019-10-01: 2 [IU] via SUBCUTANEOUS
  Administered 2019-10-02 (×2): 3 [IU] via SUBCUTANEOUS
  Administered 2019-10-02: 2 [IU] via SUBCUTANEOUS
  Administered 2019-10-03: 3 [IU] via SUBCUTANEOUS
  Filled 2019-09-30 (×6): qty 1

## 2019-09-30 MED ORDER — INSULIN DETEMIR 100 UNIT/ML ~~LOC~~ SOLN
30.0000 [IU] | Freq: Two times a day (BID) | SUBCUTANEOUS | Status: DC
  Administered 2019-10-01 – 2019-10-03 (×6): 30 [IU] via SUBCUTANEOUS
  Filled 2019-09-30 (×8): qty 0.3

## 2019-09-30 MED ORDER — SERTRALINE HCL 50 MG PO TABS
25.0000 mg | ORAL_TABLET | Freq: Every day | ORAL | Status: DC
  Administered 2019-10-01 – 2019-10-02 (×3): 25 mg via ORAL
  Filled 2019-09-30 (×3): qty 1

## 2019-09-30 MED ORDER — MORPHINE SULFATE (PF) 4 MG/ML IV SOLN
4.0000 mg | Freq: Once | INTRAVENOUS | Status: AC
  Administered 2019-09-30: 4 mg via INTRAVENOUS
  Filled 2019-09-30: qty 1

## 2019-09-30 MED ORDER — POTASSIUM CHLORIDE CRYS ER 20 MEQ PO TBCR
40.0000 meq | EXTENDED_RELEASE_TABLET | Freq: Once | ORAL | Status: AC
  Administered 2019-09-30: 40 meq via ORAL
  Filled 2019-09-30: qty 2

## 2019-09-30 MED ORDER — SODIUM CHLORIDE 0.9% FLUSH: 3.0000 mL | INTRAVENOUS | Status: DC | PRN

## 2019-09-30 MED ORDER — ALBUTEROL SULFATE HFA 108 (90 BASE) MCG/ACT IN AERS
2.0000 | INHALATION_SPRAY | Freq: Four times a day (QID) | RESPIRATORY_TRACT | Status: DC | PRN
  Filled 2019-09-30: qty 6.7

## 2019-09-30 MED ORDER — NITROGLYCERIN 0.3 MG SL SUBL
0.3000 mg | SUBLINGUAL_TABLET | SUBLINGUAL | Status: DC | PRN
  Filled 2019-09-30: qty 100

## 2019-09-30 MED ORDER — IOHEXOL 300 MG/ML  SOLN
75.0000 mL | Freq: Once | INTRAMUSCULAR | Status: AC | PRN
  Administered 2019-09-30: 75 mL via INTRAVENOUS

## 2019-09-30 MED ORDER — SODIUM CHLORIDE 0.9% FLUSH
3.0000 mL | Freq: Two times a day (BID) | INTRAVENOUS | Status: DC
  Administered 2019-10-01 – 2019-10-02 (×4): 3 mL via INTRAVENOUS

## 2019-09-30 MED ORDER — SODIUM CHLORIDE 0.9 % IV SOLN
1.0000 g | INTRAVENOUS | Status: DC
  Administered 2019-10-01 – 2019-10-03 (×3): 1 g via INTRAVENOUS
  Filled 2019-09-30 (×2): qty 1
  Filled 2019-09-30: qty 10
  Filled 2019-09-30: qty 1

## 2019-09-30 MED ORDER — HYDRALAZINE HCL 20 MG/ML IJ SOLN: 5.0000 mg | INTRAMUSCULAR | Status: DC | PRN

## 2019-09-30 MED ORDER — SODIUM CHLORIDE 0.9 % IV SOLN
1.0000 g | Freq: Once | INTRAVENOUS | Status: AC
  Administered 2019-09-30: 1 g via INTRAVENOUS
  Filled 2019-09-30: qty 10

## 2019-09-30 MED ORDER — MIDODRINE HCL 5 MG PO TABS
5.0000 mg | ORAL_TABLET | Freq: Three times a day (TID) | ORAL | Status: DC
  Administered 2019-10-01: 5 mg via ORAL
  Filled 2019-09-30 (×4): qty 1

## 2019-09-30 MED ORDER — METOPROLOL TARTRATE 25 MG PO TABS
25.0000 mg | ORAL_TABLET | Freq: Two times a day (BID) | ORAL | Status: DC
  Administered 2019-10-01 – 2019-10-03 (×6): 25 mg via ORAL
  Filled 2019-09-30 (×6): qty 1

## 2019-09-30 MED ORDER — ACETAMINOPHEN 325 MG PO TABS: 650.0000 mg | ORAL_TABLET | Freq: Four times a day (QID) | ORAL | Status: DC | PRN

## 2019-09-30 MED ORDER — ENOXAPARIN SODIUM 40 MG/0.4ML ~~LOC~~ SOLN
40.0000 mg | SUBCUTANEOUS | Status: DC
  Administered 2019-10-01 – 2019-10-03 (×3): 40 mg via SUBCUTANEOUS
  Filled 2019-09-30 (×3): qty 0.4

## 2019-09-30 MED ORDER — LACTATED RINGERS IV BOLUS
1000.0000 mL | Freq: Once | INTRAVENOUS | Status: AC
  Administered 2019-09-30: 1000 mL via INTRAVENOUS

## 2019-09-30 MED ORDER — ACETAMINOPHEN 650 MG RE SUPP: 650.0000 mg | Freq: Four times a day (QID) | RECTAL | Status: DC | PRN

## 2019-09-30 MED ORDER — ONDANSETRON 4 MG PO TBDP: 4.0000 mg | ORAL_TABLET | Freq: Three times a day (TID) | ORAL | Status: DC | PRN

## 2019-09-30 MED ORDER — SODIUM CHLORIDE 0.9 % IV SOLN: INTRAVENOUS | Status: DC

## 2019-09-30 NOTE — Progress Notes (Signed)
Attempting to get ahold of pts family due to inability to speak with pt at this time. Records pull from home health records recent as of 09/28/19.

## 2019-09-30 NOTE — Telephone Encounter (Signed)
Message left for [name] to return my call.

## 2019-09-30 NOTE — ED Triage Notes (Signed)
First Nurse Note: Patient from home via [facility] complaining of bilateral leg and lower back pain.

## 2019-09-30 NOTE — Telephone Encounter (Signed)
[name]   She would like to extend PT into a new certification.  Pt went to [place] today by EMS. Pt was in  intractable pain and could not stand. But  [name] was able to walk pt 35 feet to gurney Pt was Hurting really bad in back hip legs  Heart rate was elevated  bp was very high 180/100  blood sugar was very high 451

## 2019-09-30 NOTE — ED Provider Notes (Signed)
[facility] Emergency Department Provider Note   ____________________________________________   First MD Initiated Contact with Patient 09/30/19 1655     (approximate)  I have reviewed the triage vital signs and the nursing notes.   HISTORY  Chief Complaint Back Pain, Hip Pain, Leg Pain, and Diarrhea    HPI [name] is a 64 y.o. female with possible history of chronic pain, atrial fibrillation, stroke, diabetes, hypertension, and CKD who presents to the ED complaining of back pain and diarrhea.  Patient reports that she has been having watery bowel movements for about the past 10 days, describes 3-4 bowel movements per day with no blood.  This been associated with worsening pain in her low back and across into both of her flanks.  She has felt increasingly weak with malaise and a cough, but denies any fevers, chills, chest pain, or shortness of breath.  She denies any dysuria or hematuria, but does endorse some nausea with no vomiting.  She states she has felt so weak at times that she was unable to walk earlier today.  She denies any difficulty urinating or saddle anesthesia.        Past Medical History:  Diagnosis Date  . Acute encephalopathy 05/22/2018  . Allergy   . Anxiety   . Ascites   . C. difficile colitis 07/10/2015  . Cancer (HCC)    HX OF CANCER OF UTERUS   . Cirrhosis of liver not due to alcohol ([place]) 2016  . Degenerative disk disease   . Diverticulitis   . Gastroparesis   . GERD (gastroesophageal reflux disease)   . History of hiatal hernia   . Hypertension   . Hypothyroid   . Hypothyroidism due to amiodarone   . Ileus ([place]) 08/01/2015  . Intussusception intestine ([place]) 05/2015  . Orthostatic hypotension   . PAF (paroxysmal atrial fibrillation) ([place]) 03/2015   a. new onset 03/2015 in setting of intractable N/V; b. on Eliquis 5 mg bid; c. CHADSVASc 4 (DM, TIA x 2, female)  . Pancreatitis   . Pneumonia 11/14/2015  . Right  ureteral stone 07/14/2016  . Sepsis ([place]) 12/15/2017  . Sick sinus syndrome ([place])   . Stomach ulcer   . Stroke [facility])    with minimal left sided weakness  . Syncope 01/2015  . Syncope due to orthostatic hypotension 05/18/2015  . Tachyarrhythmia 01/10/2016  . TIA (transient ischemic attack) 02/2015  . Type 1 diabetes ([place])    on levemir  . UTERINE CANCER, HX OF 03/27/2007   Qualifier: Diagnosis of  By: Maxie Better FNP, Rosalita Levan   . UTI (urinary tract infection) 05/22/2018    Patient Active Problem List   Diagnosis Date Noted  . Acute pyelonephritis 09/30/2019  . Acute pain of left foot 08/18/2019  . Type II diabetes mellitus with renal manifestations ([place]) 08/13/2019  . Weakness of both lower extremities 07/31/2019  . Altered mental status 07/14/2019  . GAD (generalized anxiety disorder) 06/11/2019  . Poor memory 06/11/2019  . Constipation 05/25/2019  . Acute medial meniscus tear of left knee 05/25/2019  . AKI (acute kidney injury) ([place]) 05/16/2019  . Closed fracture of left distal fibula 05/16/2019  . Sinus tachycardia 05/05/2019  . Congestive dilated cardiomyopathy ([place]) 04/14/2019  . CAD (coronary artery disease) 04/10/2019  . Closed fracture of lateral malleolus 04/10/2019  . Lumbar facet syndrome (Bilateral) (R>L) 03/09/2019  . Long term (current) use of anticoagulants 03/09/2019  . Unspecified cirrhosis of liver ([place]) 02/11/2019  .  Hypoalbuminemia 02/11/2019  . Edema due to hypoalbuminemia 02/11/2019  . Lower extremity edema 01/26/2019  . Prolonged QT interval 12/17/2018  . Hypomagnesemia 12/17/2018  . Uncontrolled type 2 diabetes mellitus ([place]) 12/17/2018  . H/O TIA (transient ischemic attack) and stroke 12/17/2018  . Personal history of surgery to heart and great vessels, presenting hazards to health 12/17/2018  . DDD (degenerative disc disease), lumbar 12/17/2018  . Lumbar intervertebral disc displacement 12/17/2018  . Chronic musculoskeletal pain 12/17/2018  . Chronic  generalized pain 12/17/2018  . Hyponatremia 12/17/2018  . Fibromyalgia syndrome 12/17/2018  . Other intervertebral disc displacement, lumbar region 12/17/2018  . Vitamin D deficiency 11/26/2018  . Elevated C-reactive protein (CRP) 11/26/2018  . Elevated sed rate 11/26/2018  . Chronic low back pain (Primary area of Pain) (Bilateral) (L>R) w/ sciatica (Bilateral) 11/25/2018  . Chronic lower extremity pain (Secondary Area of Pain) (Bilateral) (L>R) 11/25/2018  . Chronic neck pain 11/25/2018  . Pharmacologic therapy 11/25/2018  . Disorder of skeletal system 11/25/2018  . Problems influencing health status 11/25/2018  . Renal mass 10/06/2018  . Chronic intermittent abdominal pain 09/25/2018  . Unspecified abdominal pain 09/25/2018  . Frequent falls 09/12/2018  . Orthostatic hypotension 08/18/2018  . Normochromic normocytic anemia 08/18/2018  . Acute renal failure superimposed on stage 3a chronic kidney disease ([place]) 08/18/2018  . History of hypothyroidism 08/14/2018  . Medical non-compliance 08/14/2018  . Diabetic ketoacidosis ([place]) 05/22/2018  . UTI (urinary tract infection) 05/22/2018  . NSTEMI (non-ST elevated myocardial infarction) ([place]) 01/11/2016  . Tachy-brady syndrome ([place]) 12/28/2015  . PAF (paroxysmal atrial fibrillation) ([place]) 11/24/2015  . Type 2 diabetes mellitus with neurologic complication ([place]) 76/22/6333  . S/P cholecystectomy 11/23/2015  . Narcotic abuse ([place]) 11/22/2015  . Chronic superficial gastritis without bleeding 11/22/2015  . History of Clostridium difficile 11/22/2015  . History of TIA (transient ischemic attack) 11/22/2015  . Mixed hyperlipidemia 11/22/2015  . Diverticulosis 11/22/2015  . History of uterine cancer 11/22/2015  . Hypothyroidism, unspecified 11/22/2015  . Intractable cyclical vomiting with nausea 11/22/2015  . Narcotic withdrawal ([place]) 11/11/2015  . Hyperlipidemia 08/31/2015  . Hypotension 08/06/2015  . Cryptogenic cirrhosis ([place])  07/10/2015  . Atrial fibrillation ([place]) 07/10/2015  . Malnutrition of moderate degree 07/04/2015  . Symptomatic bradycardia 05/27/2015  . Chronic anemia 05/19/2015  . Thrombocytopenia ([place]) 05/19/2015  . Hypokalemia 04/06/2015  . Hyperlipidemia with target LDL less than 100 04/06/2015  . Syncope 03/11/2015  . CVA (cerebral vascular accident) ([place]) 02/15/2015  . Essential hypertension 01/12/2015  . Chronically on opiate therapy 01/12/2015  . Cirrhosis of liver not due to alcohol ([place]) 2016  . S/P Nissen fundoplication (without gastrostomy tube) procedure 05/11/2014  . Dysphagia 04/19/2014  . DEPRESSION/ANXIETY 06/27/2007  . Myofascial pain syndrome 06/27/2007  . Chronic pain syndrome 03/28/2007  . GERD 03/27/2007  . Diverticulosis of large intestine without perforation or abscess without bleeding 03/27/2007  . Proteinuria 03/27/2007    Past Surgical History:  Procedure Laterality Date  . ABDOMINAL HYSTERECTOMY    . CARDIAC CATHETERIZATION N/A 01/12/2016   Procedure: Left Heart Cath and Coronary Angiography;  Surgeon: Wellington Hampshire, MD;  Location: [place] CV LAB;  Service: Cardiovascular;  Laterality: N/A;  . CHOLECYSTECTOMY    . CYSTOSCOPY/URETEROSCOPY/HOLMIUM LASER Right 07/14/2016   Procedure: CYSTOSCOPY/URETEROSCOPY/HOLMIUM LASER;  Surgeon: Alexis Frock, MD;  Location: [facility] ORS;  Service: Urology;  Laterality: Right;  . ESOPHAGOGASTRODUODENOSCOPY N/A 04/04/2015   Procedure: ESOPHAGOGASTRODUODENOSCOPY (EGD);  Surgeon: Hulen Luster, MD;  Location: [facility] ENDOSCOPY;  Service:  Endoscopy;  Laterality: N/A;  . ESOPHAGOGASTRODUODENOSCOPY N/A 12/28/2017   Procedure: ESOPHAGOGASTRODUODENOSCOPY (EGD);  Surgeon: Lin Landsman, MD;  Location: [facility] ENDOSCOPY;  Service: Gastroenterology;  Laterality: N/A;  . ESOPHAGOGASTRODUODENOSCOPY (EGD) WITH PROPOFOL N/A 01/18/2016   Procedure: ESOPHAGOGASTRODUODENOSCOPY (EGD) WITH PROPOFOL;  Surgeon: Lucilla Lame, MD;  Location: [facility] ENDOSCOPY;   Service: Endoscopy;  Laterality: N/A;  . FLEXIBLE SIGMOIDOSCOPY N/A 01/18/2016   Procedure: FLEXIBLE SIGMOIDOSCOPY;  Surgeon: Lucilla Lame, MD;  Location: [facility] ENDOSCOPY;  Service: Endoscopy;  Laterality: N/A;  . HERNIA REPAIR      Prior to Admission medications   Medication Sig Start Date End Date Taking? Authorizing Provider  albuterol (PROVENTIL HFA;VENTOLIN HFA) 108 (90 Base) MCG/ACT inhaler Inhale 2 puffs into the lungs every 6 (six) hours as needed for wheezing or shortness of breath. 11/27/18   Epifanio Lesches, MD  cyclobenzaprine (FLEXERIL) 10 MG tablet Take 10 mg by mouth 3 (three) times daily as needed. 09/10/19   [provider]  Insulin Detemir (LEVEMIR) 100 UNIT/ML Pen Inject 30 units in the morning and 30 units at bedtime. Patient taking differently: Inject 30 Units into the skin 2 (two) times daily.  02/09/19   [name], NP  metoprolol tartrate (LOPRESSOR) 25 MG tablet Take 1 tablet (25 mg total) by mouth 2 (two) times daily. 07/29/19 10/27/19  Minna Merritts, MD  midodrine (PROAMATINE) 5 MG tablet Take 1 tablet (5 mg total) by mouth 3 (three) times daily with meals. 07/29/19   Minna Merritts, MD  nitroGLYCERIN (NITROSTAT) 0.3 MG SL tablet Place 1 tablet (0.3 mg total) under the tongue every 5 (five) minutes as needed for chest pain. 07/24/19 07/23/20  Nena Polio, MD  ondansetron (ZOFRAN ODT) 4 MG disintegrating tablet Take 1 tablet (4 mg total) by mouth every 8 (eight) hours as needed for nausea or vomiting. 08/26/19   Carrie Mew, MD  oxyCODONE (OXY IR/ROXICODONE) 5 MG immediate release tablet Take 5 mg by mouth every 4 (four) hours as needed. 09/10/19   [provider]  sertraline (ZOLOFT) 25 MG tablet TAKE 1 TABLET AT BEDTIME FOR ANXIETY Patient taking differently: Take 25 mg by mouth at bedtime.  07/31/19   [name], NP  ULTICARE MINI PEN NEEDLES 31G X 6 MM MISC USE TWICE DAILY AS DIRECTED 08/29/19   [name], NP      Allergies Aspirin, Codeine sulfate, Erythromycin, Prednisone, Rosiglitazone maleate, and Tetanus-diphtheria toxoids td  Family History  Problem Relation Age of Onset  . Hypertension Mother   . CAD Sister   . Heart attack Sister        Deceased 27-Nov-2014  . CAD Brother     Social History Social History   Tobacco Use  . Smoking status: Former Smoker    Types: Cigarettes  . Smokeless tobacco: Never Used  . Tobacco comment: 25 years ago and only smoked occasionally  Substance Use Topics  . Alcohol use: No  . Drug use: No    Review of Systems  Constitutional: No fever/chills Eyes: No visual changes. ENT: No sore throat. Cardiovascular: Denies chest pain. Respiratory: Denies shortness of breath.  Positive for cough. Gastrointestinal: No abdominal pain.  Positive for nausea, no vomiting.  Positive for flank pain.  Positive for diarrhea.  No constipation. Genitourinary: Negative for dysuria. Musculoskeletal: Positive for for back pain. Skin: Negative for rash. Neurological: Negative for headaches, focal weakness or numbness.  ____________________________________________   PHYSICAL EXAM:  VITAL SIGNS: ED Triage Vitals  Enc  Vitals Group     BP 09/30/19 1208 119/65     Pulse Rate 09/30/19 1208 (!) 112     Resp 09/30/19 1208 20     Temp 09/30/19 1208 98.2 F (36.8 C)     Temp Source 09/30/19 1208 Oral     SpO2 09/30/19 1208 100 %     Weight 09/30/19 1206 150 lb (68 kg)     Height 09/30/19 1206 5' 3"  (1.6 m)     Head Circumference --      Peak Flow --      Pain Score 09/30/19 1205 10     Pain Loc --      Pain Edu? --      Excl. in [place]? --     Constitutional: Alert and oriented. Eyes: Conjunctivae are normal. Head: Atraumatic. Nose: No congestion/rhinnorhea. Mouth/Throat: Mucous membranes are moist. Neck: Normal ROM Cardiovascular: Normal rate, regular rhythm. Grossly normal heart sounds. Respiratory: Normal respiratory effort.  No retractions. Lungs  CTAB. Gastrointestinal: Soft and diffusely tender to palpation with no rebound or guarding. No distention.  CVA tenderness noted bilaterally. Genitourinary: deferred Musculoskeletal: No lower extremity tenderness nor edema. Neurologic:  Normal speech and language. No gross focal neurologic deficits are appreciated.  Strength exam in her lower extremities is limited secondary to pain.  Sensation intact throughout lower extremities. Skin:  Skin is warm, dry and intact. No rash noted. Psychiatric: Mood and affect are normal. Speech and behavior are normal.  ____________________________________________   LABS (all labs ordered are listed, but only abnormal results are displayed)  Labs Reviewed  COMPREHENSIVE METABOLIC PANEL - Abnormal; Notable for the following components:      Result Value   Potassium 3.2 (*)    CO2 17 (*)    Glucose, Bld 416 (*)    BUN 26 (*)    Creatinine, Ser 1.45 (*)    Calcium 8.8 (*)    Alkaline Phosphatase 131 (*)    GFR calc non Af Amer 38 (*)    GFR calc Af Amer 44 (*)    All other components within normal limits  CBC - Abnormal; Notable for the following components:   Hemoglobin 10.4 (*)    HCT 33.9 (*)    RDW 15.8 (*)    All other components within normal limits  URINALYSIS, COMPLETE (UACMP) WITH MICROSCOPIC - Abnormal; Notable for the following components:   Color, Urine YELLOW (*)    APPearance CLOUDY (*)    Glucose, UA >=500 (*)    Hgb urine dipstick LARGE (*)    Protein, ur 100 (*)    Leukocytes,Ua LARGE (*)    RBC / HPF >50 (*)    WBC, UA >50 (*)    All other components within normal limits  GLUCOSE, CAPILLARY - Abnormal; Notable for the following components:   Glucose-Capillary 363 (*)    All other components within normal limits  URINE CULTURE  SARS CORONAVIRUS 2 (TAT 6-24 HRS)  LIPASE, BLOOD  BASIC METABOLIC PANEL  CBC  CBC  CREATININE, SERUM  POC SARS CORONAVIRUS 2 AG -  ED  POC SARS CORONAVIRUS 2 AG     PROCEDURES  Procedure(s) performed (including Critical Care):  Procedures   ____________________________________________   INITIAL IMPRESSION / ASSESSMENT AND PLAN / ED COURSE       64 year old female with history of chronic pain, A. fib, diabetes, hypertension, and cirrhosis presents to the ED with increasing back and flank pain along with diarrhea over about the past  10 days.  She also endorses a cough as well as generalized malaise, will need to rule out COVID-19.  Her presentation does not appear consistent with cauda equina given lack of urinary retention, saddle anesthesia, or loss of sensation, however the strength exam in her lower extremities is limited secondary to pain.  She initially denied any abdominal pain, but seem tender on exam along with CVA tenderness, will further assess with CT.  Lab work thus far is reassuring but UA does appear consistent with UTI, will need to assess for infected stone versus pyelonephritis.  CT shows thickening of ureteral wall but no evidence of obstruction, consistent with pyelonephritis.  Reviewing her prior cultures, she would be appropriate for treatment with Rocephin.  She continues to have difficulty ambulating, will admit for further management.      ____________________________________________   FINAL CLINICAL IMPRESSION(S) / ED DIAGNOSES  Final diagnoses:  Pyelonephritis  Generalized weakness     ED Discharge Orders    None       Note:  This document was prepared using Dragon voice recognition software and may include unintentional dictation errors.   Blake Divine, MD 09/30/19 [phone]

## 2019-09-30 NOTE — ED Notes (Signed)
Pt pivoted to the wheelchair and able to use restroom without assistance.

## 2019-09-30 NOTE — Telephone Encounter (Signed)
Noted, agree to extending PT. Also mention to [facility] that home health nursing needs to ensure she's injecting prescribed insulin correctly.

## 2019-09-30 NOTE — ED Triage Notes (Signed)
Pt reports pain to her hips, legs and lower back and some diarrhea for the last 10 days. Pt states last BM was early this am.

## 2019-10-01 ENCOUNTER — Encounter: Payer: Self-pay | Admitting: Emergency Medicine

## 2019-10-01 DIAGNOSIS — N10 Acute tubulo-interstitial nephritis: Secondary | ICD-10-CM

## 2019-10-01 LAB — BASIC METABOLIC PANEL
Anion gap: 11 (ref 5–15)
BUN: 21 mg/dL (ref 8–23)
CO2: 16 mmol/L — ABNORMAL LOW (ref 22–32)
Calcium: 8.2 mg/dL — ABNORMAL LOW (ref 8.9–10.3)
Chloride: 107 mmol/L (ref 98–111)
Creatinine, Ser: 1.23 mg/dL — ABNORMAL HIGH (ref 0.44–1.00)
GFR calc Af Amer: 53 mL/min — ABNORMAL LOW (ref >60)
GFR calc non Af Amer: 46 mL/min — ABNORMAL LOW (ref >60)
Glucose, Bld: 345 mg/dL — ABNORMAL HIGH (ref 70–99)
Potassium: 3.6 mmol/L (ref 3.5–5.1)
Sodium: 134 mmol/L — ABNORMAL LOW (ref 135–145)

## 2019-10-01 LAB — GLUCOSE, CAPILLARY
Glucose-Capillary: 306 mg/dL — ABNORMAL HIGH (ref 70–99)
Glucose-Capillary: 97 mg/dL (ref 70–99)
Glucose-Capillary: 146 mg/dL — ABNORMAL HIGH (ref 70–99)
Glucose-Capillary: 210 mg/dL — ABNORMAL HIGH (ref 70–99)

## 2019-10-01 LAB — CBC
HCT: 32.3 % — ABNORMAL LOW (ref 36.0–46.0)
Hemoglobin: 9.2 g/dL — ABNORMAL LOW (ref 12.0–15.0)
MCH: 25.7 pg — ABNORMAL LOW (ref 26.0–34.0)
MCHC: 28.5 g/dL — ABNORMAL LOW (ref 30.0–36.0)
MCV: 90.2 fL (ref 80.0–100.0)
Platelets: 142 10*3/uL — ABNORMAL LOW (ref 150–400)
RBC: 3.58 MIL/uL — ABNORMAL LOW (ref 3.87–5.11)
RDW: 16 % — ABNORMAL HIGH (ref 11.5–15.5)
WBC: 3.7 10*3/uL — ABNORMAL LOW (ref 4.0–10.5)
nRBC: 0 % (ref 0.0–0.2)

## 2019-10-01 LAB — SARS CORONAVIRUS 2 (TAT 6-24 HRS): SARS Coronavirus 2: NEGATIVE

## 2019-10-01 MED ORDER — ATORVASTATIN CALCIUM 20 MG PO TABS
40.0000 mg | ORAL_TABLET | Freq: Every day | ORAL | Status: DC
  Administered 2019-10-02: 40 mg via ORAL
  Filled 2019-10-01: qty 2

## 2019-10-01 MED ORDER — OXYCODONE HCL 5 MG PO TABS
5.0000 mg | ORAL_TABLET | Freq: Four times a day (QID) | ORAL | Status: DC | PRN
  Administered 2019-10-02 – 2019-10-03 (×3): 5 mg via ORAL
  Filled 2019-10-01 (×3): qty 1

## 2019-10-01 MED ORDER — CYCLOBENZAPRINE HCL 10 MG PO TABS: 10.0000 mg | ORAL_TABLET | Freq: Three times a day (TID) | ORAL | Status: DC | PRN

## 2019-10-01 NOTE — TOC Initial Note (Signed)
Transition of Care [facility]) - Initial/Assessment Note    Patient Details  Name: [name] MRN: [id number] Date of Birth: 1956-08-19  Transition of Care [facility]) CM/SW Contact:    Elease Hashimoto, LCSW Phone Number: 10/01/2019, 2:41 PM  Clinical Narrative:   Met with pt to discuss re-admsisions and services at home in place. She lives with her husband who is retired also but has health issues of his own. She has [facility] seeing her for [place] along with a PCA_Tammy who assists with transportation and grocery shopping. Pt is independent with her ADL's but is falling at home. She attributes this to the CVA she had last year and the residual weakness she has on her left side. Discussed maybe staying in a wheelchair at home due to the falls she is having at home. She will think about this. She has a son who lives in [place] with her elderly mother and helps her. Her home has a ramp in place. Pt hopes to feel better soon but not sure when she will. CSW will follow for any discharge needs.           Expected Discharge Plan: [place] Barriers to Discharge: Continued Medical Work up   Patient Goals and CMS Choice Patient states their goals for this hospitalization and ongoing recovery are:: Pt feels bad legs hurting her and balance is bad. Wants to quit falling at home      Expected Discharge Plan and Services Expected Discharge Plan: [place] In-house Referral: Clinical Social Work     Living arrangements for the past 2 months: Mobile Home                                      Prior Living Arrangements/Services Living arrangements for the past 2 months: Mobile Home Lives with:: Spouse   Do you feel safe going back to the place where you live?: Yes          Current home services: DME, Homehealth aide, Home PT, Home RN(Has [facility] for PT & RN and PCA aide-Tammy and rolling walker, cane and toliet risor)    Activities of Daily Living Home  Assistive Devices/Equipment: Environmental consultant (specify type), Cane (specify quad or straight) ADL Screening (condition at time of admission) Patient's cognitive ability adequate to safely complete daily activities?: Yes Is the patient deaf or have difficulty hearing?: No Does the patient have difficulty seeing, even when wearing glasses/contacts?: No Does the patient have difficulty concentrating, remembering, or making decisions?: No Patient able to express need for assistance with ADLs?: Yes Does the patient have difficulty dressing or bathing?: No Independently performs ADLs?: Yes (appropriate for developmental age) Does the patient have difficulty walking or climbing stairs?: Yes Weakness of Legs: Both Weakness of Arms/Hands: Both  Permission Sought/Granted                  Emotional Assessment Appearance:: Appears stated age Attitude/Demeanor/Rapport: Engaged Affect (typically observed): Accepting Orientation: : Oriented to Self, Oriented to Place, Oriented to  Time, Oriented to Situation      Admission diagnosis:  Pyelonephritis [N12] Generalized weakness [R53.1] Acute pyelonephritis [N10] Patient Active Problem List   Diagnosis Date Noted  . Acute pyelonephritis 09/30/2019  . Acute pain of left foot 08/18/2019  . Type II diabetes mellitus with renal manifestations ([place]) 08/13/2019  . Weakness of both lower extremities  07/31/2019  . Altered mental status 07/14/2019  . GAD (generalized anxiety disorder) 06/11/2019  . Poor memory 06/11/2019  . Constipation 05/25/2019  . Acute medial meniscus tear of left knee 05/25/2019  . AKI (acute kidney injury) ([place]) 05/16/2019  . Closed fracture of left distal fibula 05/16/2019  . Sinus tachycardia 05/05/2019  . Congestive dilated cardiomyopathy ([place]) 04/14/2019  . CAD (coronary artery disease) 04/10/2019  . Closed fracture of lateral malleolus 04/10/2019  . Lumbar facet syndrome (Bilateral) (R>L) 03/09/2019  . Long term (current) use  of anticoagulants 03/09/2019  . Unspecified cirrhosis of liver ([place]) 02/11/2019  . Hypoalbuminemia 02/11/2019  . Edema due to hypoalbuminemia 02/11/2019  . Lower extremity edema 01/26/2019  . Prolonged QT interval 12/17/2018  . Hypomagnesemia 12/17/2018  . Uncontrolled type 2 diabetes mellitus ([place]) 12/17/2018  . H/O TIA (transient ischemic attack) and stroke 12/17/2018  . Personal history of surgery to heart and great vessels, presenting hazards to health 12/17/2018  . DDD (degenerative disc disease), lumbar 12/17/2018  . Lumbar intervertebral disc displacement 12/17/2018  . Chronic musculoskeletal pain 12/17/2018  . Chronic generalized pain 12/17/2018  . Hyponatremia 12/17/2018  . Fibromyalgia syndrome 12/17/2018  . Other intervertebral disc displacement, lumbar region 12/17/2018  . Vitamin D deficiency 11/26/2018  . Elevated C-reactive protein (CRP) 11/26/2018  . Elevated sed rate 11/26/2018  . Chronic low back pain (Primary area of Pain) (Bilateral) (L>R) w/ sciatica (Bilateral) 11/25/2018  . Chronic lower extremity pain (Secondary Area of Pain) (Bilateral) (L>R) 11/25/2018  . Chronic neck pain 11/25/2018  . Pharmacologic therapy 11/25/2018  . Disorder of skeletal system 11/25/2018  . Problems influencing health status 11/25/2018  . Renal mass 10/06/2018  . Chronic intermittent abdominal pain 09/25/2018  . Unspecified abdominal pain 09/25/2018  . Frequent falls 09/12/2018  . Orthostatic hypotension 08/18/2018  . Normochromic normocytic anemia 08/18/2018  . Acute renal failure superimposed on stage 3a chronic kidney disease ([place]) 08/18/2018  . History of hypothyroidism 08/14/2018  . Medical non-compliance 08/14/2018  . Diabetic ketoacidosis ([place]) 05/22/2018  . UTI (urinary tract infection) 05/22/2018  . NSTEMI (non-ST elevated myocardial infarction) ([place]) 01/11/2016  . Tachy-brady syndrome ([place]) 12/28/2015  . PAF (paroxysmal atrial fibrillation) ([place]) 11/24/2015  . Type 2  diabetes mellitus with neurologic complication ([place]) 67/34/1937  . S/P cholecystectomy 11/23/2015  . Narcotic abuse ([place]) 11/22/2015  . Chronic superficial gastritis without bleeding 11/22/2015  . History of Clostridium difficile 11/22/2015  . History of TIA (transient ischemic attack) 11/22/2015  . Mixed hyperlipidemia 11/22/2015  . Diverticulosis 11/22/2015  . History of uterine cancer 11/22/2015  . Hypothyroidism, unspecified 11/22/2015  . Intractable cyclical vomiting with nausea 11/22/2015  . Narcotic withdrawal ([place]) 11/11/2015  . Hyperlipidemia 08/31/2015  . Hypotension 08/06/2015  . Cryptogenic cirrhosis ([place]) 07/10/2015  . Atrial fibrillation ([place]) 07/10/2015  . Malnutrition of moderate degree 07/04/2015  . Symptomatic bradycardia 05/27/2015  . Chronic anemia 05/19/2015  . Thrombocytopenia ([place]) 05/19/2015  . Hypokalemia 04/06/2015  . Hyperlipidemia with target LDL less than 100 04/06/2015  . Syncope 03/11/2015  . CVA (cerebral vascular accident) ([place]) 02/15/2015  . Essential hypertension 01/12/2015  . Chronically on opiate therapy 01/12/2015  . Cirrhosis of liver not due to alcohol ([place]) 2016  . S/P Nissen fundoplication (without gastrostomy tube) procedure 05/11/2014  . Dysphagia 04/19/2014  . DEPRESSION/ANXIETY 06/27/2007  . Myofascial pain syndrome 06/27/2007  . Chronic pain syndrome 03/28/2007  . GERD 03/27/2007  . Diverticulosis of large intestine without perforation or abscess without bleeding 03/27/2007  .  Proteinuria 03/27/2007   PCP:  [name], NP Pharmacy:   [place], [place] - [address] [place] [address] Phone: [phone] Fax: [phone]     Social Determinants of Health (SDOH) Interventions    Readmission Risk Interventions Readmission Risk Prevention Plan 10/01/2019 07/18/2019 07/16/2019  Transportation Screening Complete Complete Complete  Medication Review Press photographer) Complete  Complete Complete  PCP or Specialist appointment within 3-5 days of discharge Complete Complete -  [place] or Home Care Consult Complete Complete Complete  SW Recovery Care/Counseling Consult Complete Complete -  Palliative Care Screening Not Applicable Not Applicable Not [place] - Not Applicable Patient Refused  Some recent data might be hidden

## 2019-10-01 NOTE — H&P (Signed)
History and Physical    [name] SWH:[id number] DOB: 03-01-1956 DOA: 09/30/2019  PCP: [name], NP    Patient coming from: Home    Chief Complaint: Abdominal pain, back pain, diarrhea, weakness fatigue  HPI: [name] is a 64 y.o. female with medical history significant of chronic atrial fibrillation, CVA, diabetes, hypertension, chronic kidney disease, chronic pain, cirrhosis, came with a chief complaint of back pain diarrhea, general weakness, .  Patient reports that she has been having watery bowel movements for about the past 10 days, describes 3-4 bowel movements per day with no blood.  This been associated with worsening pain in her low back and across into both of her flanks.  She has felt increasingly weak with malaise and a cough, but denies any fevers, chills, chest pain, or shortness of breath.  ED Course:  In the emergency room patient in mild distress complaining of lower back pain pain radiating to the left leg Sodium 135 potassium 3.2 CO2 17 blood sugar 416 anion gap 10 White count 4.9 hemoglobin 10.4 CT scan abdomen and pelvis diffuse for thickening of the right ureter with mild right-sided hydroureteronephrosis Patient started on Rocephin IV  Review of Systems: As per HPI otherwise 10 point review of systems negative.  Except abdominal pain, weakness, back pain, diarrhea, cough  Past Medical History:  Diagnosis Date  . Acute encephalopathy 05/22/2018  . Allergy   . Anxiety   . Ascites   . C. difficile colitis 07/10/2015  . Cancer (HCC)    HX OF CANCER OF UTERUS   . Cirrhosis of liver not due to alcohol ([place]) 2016  . Degenerative disk disease   . Diverticulitis   . Gastroparesis   . GERD (gastroesophageal reflux disease)   . History of hiatal hernia   . Hypertension   . Hypothyroid   . Hypothyroidism due to amiodarone   . Ileus ([place]) 08/01/2015  . Intussusception intestine ([place]) 05/2015  . Orthostatic hypotension   . PAF (paroxysmal  atrial fibrillation) ([place]) 03/2015   a. new onset 03/2015 in setting of intractable N/V; b. on Eliquis 5 mg bid; c. CHADSVASc 4 (DM, TIA x 2, female)  . Pancreatitis   . Pneumonia 11/14/2015  . Right ureteral stone 07/14/2016  . Sepsis ([place]) 12/15/2017  . Sick sinus syndrome ([place])   . Stomach ulcer   . Stroke [facility])    with minimal left sided weakness  . Syncope 01/2015  . Syncope due to orthostatic hypotension 05/18/2015  . Tachyarrhythmia 01/10/2016  . TIA (transient ischemic attack) 02/2015  . Type 1 diabetes ([place])    on levemir  . UTERINE CANCER, HX OF 03/27/2007   Qualifier: Diagnosis of  By: Maxie Better FNP, Rosalita Levan   . UTI (urinary tract infection) 05/22/2018    Past Surgical History:  Procedure Laterality Date  . ABDOMINAL HYSTERECTOMY    . CARDIAC CATHETERIZATION N/A 01/12/2016   Procedure: Left Heart Cath and Coronary Angiography;  Surgeon: Wellington Hampshire, MD;  Location: [facility];  Service: Cardiovascular;  Laterality: N/A;  . CHOLECYSTECTOMY    . CYSTOSCOPY/URETEROSCOPY/HOLMIUM LASER Right 07/14/2016   Procedure: CYSTOSCOPY/URETEROSCOPY/HOLMIUM LASER;  Surgeon: Alexis Frock, MD;  Location: [facility] ORS;  Service: Urology;  Laterality: Right;  . ESOPHAGOGASTRODUODENOSCOPY N/A 04/04/2015   Procedure: ESOPHAGOGASTRODUODENOSCOPY (EGD);  Surgeon: Hulen Luster, MD;  Location[facility] ENDOSCOPY;  Service: Endoscopy;  Laterality: N/A;  . ESOPHAGOGASTRODUODENOSCOPY N/A 12/28/2017   Procedure: ESOPHAGOGASTRODUODENOSCOPY (EGD);  Surgeon: Lin Landsman, MD;  Location: [facility] ENDOSCOPY;  Service: Gastroenterology;  Laterality: N/A;  . ESOPHAGOGASTRODUODENOSCOPY (EGD) WITH PROPOFOL N/A 01/18/2016   Procedure: ESOPHAGOGASTRODUODENOSCOPY (EGD) WITH PROPOFOL;  Surgeon: Lucilla Lame, MD;  Location: [facility] ENDOSCOPY;  Service: Endoscopy;  Laterality: N/A;  . FLEXIBLE SIGMOIDOSCOPY N/A 01/18/2016   Procedure: FLEXIBLE SIGMOIDOSCOPY;  Surgeon: Lucilla Lame, MD;  Location: [facility] ENDOSCOPY;  Service:  Endoscopy;  Laterality: N/A;  . HERNIA REPAIR       reports that she has quit smoking. Her smoking use included cigarettes. She has never used smokeless tobacco. She reports that she does not drink alcohol or use drugs.  Allergies  Allergen Reactions  . Aspirin Rash  . Codeine Sulfate Rash  . Erythromycin Rash    Reaction:  Fever   . Prednisone Swelling  . Rosiglitazone Maleate Swelling  . Tetanus-Diphtheria Toxoids Td Rash and Other (See Comments)    Reaction:  Fever     Family History  Problem Relation Age of Onset  . Hypertension Mother   . CAD Sister   . Heart attack Sister        Deceased November 06, 2014  . CAD Brother      Prior to Admission medications   Medication Sig Start Date End Date Taking? Authorizing Provider  albuterol (PROVENTIL HFA;VENTOLIN HFA) 108 (90 Base) MCG/ACT inhaler Inhale 2 puffs into the lungs every 6 (six) hours as needed for wheezing or shortness of breath. 11/27/18   Epifanio Lesches, MD  cyclobenzaprine (FLEXERIL) 10 MG tablet Take 10 mg by mouth 3 (three) times daily as needed. 09/10/19   [provider]  Insulin Detemir (LEVEMIR) 100 UNIT/ML Pen Inject 30 units in the morning and 30 units at bedtime. Patient taking differently: Inject 30 Units into the skin 2 (two) times daily.  02/09/19   [name], NP  metoprolol tartrate (LOPRESSOR) 25 MG tablet Take 1 tablet (25 mg total) by mouth 2 (two) times daily. 07/29/19 10/27/19  Minna Merritts, MD  midodrine (PROAMATINE) 5 MG tablet Take 1 tablet (5 mg total) by mouth 3 (three) times daily with meals. 07/29/19   Minna Merritts, MD  nitroGLYCERIN (NITROSTAT) 0.3 MG SL tablet Place 1 tablet (0.3 mg total) under the tongue every 5 (five) minutes as needed for chest pain. 07/24/19 07/23/20  Nena Polio, MD  ondansetron (ZOFRAN ODT) 4 MG disintegrating tablet Take 1 tablet (4 mg total) by mouth every 8 (eight) hours as needed for nausea or vomiting. 08/26/19   Carrie Mew, MD    oxyCODONE (OXY IR/ROXICODONE) 5 MG immediate release tablet Take 5 mg by mouth every 4 (four) hours as needed. 09/10/19   [provider]  sertraline (ZOLOFT) 25 MG tablet TAKE 1 TABLET AT BEDTIME FOR ANXIETY Patient taking differently: Take 25 mg by mouth at bedtime.  07/31/19   [name], NP  ULTICARE MINI PEN NEEDLES 31G X 6 MM MISC USE TWICE DAILY AS DIRECTED 08/29/19   [name], NP    Physical Exam: Vitals:   09/30/19 2230 09/30/19 2235 09/30/19 2246 09/30/19 2335  BP: (!) 160/76   (!) 171/86  Pulse: (!) 104 (!) 103 (!) 101 (!) 104  Resp: 16   20  Temp:    98 F (36.7 C)  TempSrc:    Oral  SpO2: 93% 99% 100% 100%  Weight:    70.8 kg  Height:    5' 1"  (1.549 m)    Constitutional: NAD, calm, comfortable Vitals:   09/30/19 2230 09/30/19 2235 09/30/19  2246 09/30/19 2335  BP: (!) 160/76   (!) 171/86  Pulse: (!) 104 (!) 103 (!) 101 (!) 104  Resp: 16   20  Temp:    98 F (36.7 C)  TempSrc:    Oral  SpO2: 93% 99% 100% 100%  Weight:    70.8 kg  Height:    5' 1"  (1.549 m)   Eyes: PERRL, lids and conjunctivae normal ENMT: Mucous membranes are moist. Posterior pharynx clear of any exudate or lesions.Normal dentition.  Neck: normal, supple, no masses, no thyromegaly Respiratory: clear to auscultation bilaterally, no wheezing, no crackles. Normal respiratory effort. No accessory muscle use.  Cardiovascular: Regular rate and rhythm, no murmurs / rubs / gallops. No extremity edema. 2+ pedal pulses. No carotid bruits.  Abdomen: no tenderness, no masses palpated. No hepatosplenomegaly. Bowel sounds positive.  Musculoskeletal: no clubbing / cyanosis. No joint deformity upper and lower extremities. Good ROM, no contractures. Normal muscle tone.  Skin: no rashes, lesions, ulcers. No induration Neurologic: CN 2-12 grossly intact. Sensation intact, DTR normal. Strength 5/5 in all 4.  Psychiatric: Normal judgment and insight. Alert and oriented x 3. Normal mood.     Labs on Admission: I have personally reviewed following labs and imaging studies  CBC: Recent Labs  Lab 09/30/19 1243  WBC 4.9  HGB 10.4*  HCT 33.9*  MCV 84.8  PLT 818   Basic Metabolic Panel: Recent Labs  Lab 09/30/19 1243  NA 135  K 3.2*  CL 108  CO2 17*  GLUCOSE 416*  BUN 26*  CREATININE 1.45*  CALCIUM 8.8*   GFR: Estimated Creatinine Clearance: 34.8 mL/min (A) (by C-G formula based on SCr of 1.45 mg/dL (H)). Liver Function Tests: Recent Labs  Lab 09/30/19 1243  AST 34  ALT 28  ALKPHOS 131*  BILITOT 1.1  PROT 7.5  ALBUMIN 3.5   Recent Labs  Lab 09/30/19 1243  LIPASE 16   No results for input(s): AMMONIA in the last 168 hours. Coagulation Profile: No results for input(s): INR, PROTIME in the last 168 hours. Cardiac Enzymes: No results for input(s): CKTOTAL, CKMB, CKMBINDEX, TROPONINI in the last 168 hours. BNP (last 3 results) No results for input(s): PROBNP in the last 8760 hours. HbA1C: No results for input(s): HGBA1C in the last 72 hours. CBG: Recent Labs  Lab 09/30/19 2336  GLUCAP 363*   Lipid Profile: No results for input(s): CHOL, HDL, LDLCALC, TRIG, CHOLHDL, LDLDIRECT in the last 72 hours. Thyroid Function Tests: No results for input(s): TSH, T4TOTAL, FREET4, T3FREE, THYROIDAB in the last 72 hours. Anemia Panel: No results for input(s): VITAMINB12, FOLATE, FERRITIN, TIBC, IRON, RETICCTPCT in the last 72 hours. Urine analysis:    Component Value Date/Time   COLORURINE YELLOW (A) 09/30/2019 1207   APPEARANCEUR CLOUDY (A) 09/30/2019 1207   APPEARANCEUR Cloudy (A) 08/24/2019 1104   LABSPEC 1.018 09/30/2019 1207   LABSPEC 1.033 10/08/2014 0144   PHURINE 6.0 09/30/2019 1207   GLUCOSEU >=500 (A) 09/30/2019 1207   GLUCOSEU >=500 10/08/2014 0144   HGBUR LARGE (A) 09/30/2019 1207   BILIRUBINUR NEGATIVE 09/30/2019 1207   BILIRUBINUR Negative 08/24/2019 1104   BILIRUBINUR Negative 10/08/2014 0144   KETONESUR NEGATIVE 09/30/2019 1207    PROTEINUR 100 (A) 09/30/2019 1207   UROBILINOGEN 0.2 11/26/2018 1431   UROBILINOGEN 0.2 05/27/2015 1936   NITRITE NEGATIVE 09/30/2019 1207   LEUKOCYTESUR LARGE (A) 09/30/2019 1207   LEUKOCYTESUR Negative 10/08/2014 0144    Radiological Exams on Admission: CT Abdomen Pelvis W Contrast  Result Date: 09/30/2019 CLINICAL DATA:  Diffuse abdominal pain. EXAM: CT ABDOMEN AND PELVIS WITH CONTRAST TECHNIQUE: Multidetector CT imaging of the abdomen and pelvis was performed using the standard protocol following bolus administration of intravenous contrast. CONTRAST:  66m OMNIPAQUE IOHEXOL 300 MG/ML  SOLN COMPARISON:  05/16/2019 FINDINGS: Lower chest: The lung bases are clear. The heart size is normal. Hepatobiliary: The liver is cirrhotic. Normal gallbladder.There is mild extrahepatic biliary ductal dilatation, likely representing a reservoir fact. This is stable from prior study. Pancreas: Normal contours without ductal dilatation. No peripancreatic fluid collection. Spleen: The spleen is enlarged. Adrenals/Urinary Tract: --Adrenal glands: No adrenal hemorrhage. --Right kidney/ureter: The right kidney is atrophic when compared to the right. There is diffuse wall thickening throughout the right ureter with mild right-sided hydroureteronephrosis. --Left kidney/ureter: There is a punctate nonobstructing stone in the interpolar region of the left kidney. --Urinary bladder: There is asymmetric bladder wall thickening, especially on the left. Stomach/Bowel: --Stomach/Duodenum: No hiatal hernia or other gastric abnormality. Normal duodenal course and caliber. --Small bowel: No dilatation or inflammation. --Colon: There is a moderate amount of stool in the colon. --Appendix: Normal. Vascular/Lymphatic: Atherosclerotic calcification is present within the non-aneurysmal abdominal aorta, without hemodynamically significant stenosis. Esophageal varices are noted. Portosystemic shunting is noted. --No retroperitoneal  lymphadenopathy. --No mesenteric lymphadenopathy. --No pelvic or inguinal lymphadenopathy. Reproductive: Status post hysterectomy. No adnexal mass. Other: No ascites or free air. There is a trace amount of free fluid in the patient's abdomen. Musculoskeletal. No acute displaced fractures. IMPRESSION: 1. Diffuse wall thickening of the right ureter with mild right-sided hydroureteronephrosis. Findings may represent an ascending urinary tract infection in the appropriate clinical setting. 2. Asymmetric bladder wall thickening. Correlation with urinalysis is recommended. 3. Cirrhosis with sequela of portal hypertension as detailed above. 4. Moderate stool burden. Aortic Atherosclerosis (ICD10-I70.0). Electronically Signed   By: CConstance HolsterM.D.   On: 09/30/2019 19:25   DG Chest Portable 1 View  Result Date: 09/30/2019 CLINICAL DATA:  Leg and low back pain EXAM: PORTABLE CHEST 1 VIEW COMPARISON:  08/26/2019 FINDINGS: The heart size and mediastinal contours are within normal limits. Aortic atherosclerosis. Both lungs are clear. The visualized skeletal structures are unremarkable. IMPRESSION: No active disease. Electronically Signed   By: KDonavan FoilM.D.   On: 09/30/2019 18:42    EKG: Independently reviewed.   Assessment/Plan Principal Problem:   Acute pyelonephritis Active Problems:   DEPRESSION/ANXIETY   Chronic pain syndrome   GERD   Essential hypertension   CVA (cerebral vascular accident) ([place]   Hypokalemia   Hyperlipidemia with target LDL less than 100   Narcotic abuse ([place]   Uncontrolled type 2 diabetes mellitus ([place]   Atrial fibrillation ([place]   H/O TIA (transient ischemic attack) and stroke   History of hypothyroidism   Type 2 diabetes mellitus with neurologic complication (HCC)    Assessment and plan  Pyelonephritis Right flank pain abdominal pain CT scan thickening of the ureteral wall but no evidence of obstruction consistent with pyelonephritis Plan Rocephin urine  cultures  Lower back pain With left leg  Radiculopathy Plan pain medication  Patient with history of chronic pain Using narcotics Plan resume home medication  Diarrhea, weakness, fatigue cough for the last 10 days,  To rule out Covid 19 Rapid test negative Will follow with PCR  History of atrial fibrillation Rate controlled Resume Lopressor No anticoagulant is listed I asked the patient was told me she takes Eliquis Will double check in the morning  Diabetes mellitus type  2 with hyperglycemia Resume Levemir Insulin sliding scale per sensitivity factor  Chronic kidney disease stage IIIb Avoid nephrotoxins follow BUN and creatinine  Cirrhosis Stable    DVT prophylaxis: Lovenox Code Status:  Family Communication: No family at bedside Disposition Plan: Admit regular floor Admission status: Full admission   Cherryl Babin G Cayli Escajeda MD [facility]  If 7PM-7AM, please contact night-coverage [email or web address]   10/01/2019, 2:13 AM

## 2019-10-01 NOTE — Progress Notes (Signed)
PROGRESS NOTE    [name]  PPJ:[id number] DOB: 01/25/1956 DOA: 09/30/2019 PCP: [name], NP    Assessment & Plan:   Principal Problem:   Acute pyelonephritis Active Problems:   DEPRESSION/ANXIETY   Chronic pain syndrome   GERD   Essential hypertension   CVA (cerebral vascular accident) ([place])   Hypokalemia   Hyperlipidemia with target LDL less than 100   Narcotic abuse ([place])   Uncontrolled type 2 diabetes mellitus ([place])   Atrial fibrillation ([place])   H/O TIA (transient ischemic attack) and stroke   History of hypothyroidism   Type 2 diabetes mellitus with neurologic complication ([place])    [name] is a 64 y.o. female with medical history significant of chronic atrial fibrillation, CVA, diabetes, hypertension, chronic kidney disease, chronic pain, cirrhosis, came with a chief complaint of back pain diarrhea, general weakness,   In the emergency room patient in mild distress complaining of lower back pain pain radiating to the left leg.  CT scan abdomen and pelvis diffuse for thickening of the right ureter with mild right-sided hydroureteronephrosis.  Patient started on Rocephin IV  Pyelonephritis Right flank pain abdominal pain CT scan thickening of the ureteral wall but no evidence of obstruction consistent with pyelonephritis --continue Rocephin  --f/u urine cultures  Lower back pain With left leg Radiculopathy history of chronic pain --d/c IV morphine --Order home oxy 5 q6h PRN  Diarrhea, weakness, fatigue cough for the last 10 days,  Rapid test negative --C diff --continue MIVF for now  History of atrial fibrillation Rate controlled Resume Lopressor No anticoagulant is listed   Diabetes mellitus type 2 with hyperglycemia Resume Levemir Insulin sliding scale per sensitivity factor  Chronic kidney disease stage IIIb Avoid nephrotoxins follow BUN and creatinine  Cirrhosis Stable  Frequent falls --PT  DVT prophylaxis:  Lovenox SQ Code Status: DNR  Family Communication: not today Disposition Plan: Home   Subjective and Interval History:  Pt reported not feeling well, still having pain in her left flank, around her waist, and down her left leg.  Also complained of left leg weakness and frequent falls.  Mild dysuria.  Watery diarrhea 5-6 episodes per day.  No fever, dyspnea, chest pain, abdominal pain, N/V, increased swelling.    Objective: Vitals:   10/01/19 0420 10/01/19 1255 10/01/19 1702 10/01/19 2100  BP: 127/80 (!) 143/69 (!) 149/66 (!) 145/82  Pulse: 73 77 84 85  Resp: 20 17  16   Temp: 97.8 F (36.6 C) 98.6 F (37 C)  98.4 F (36.9 C)  TempSrc: Oral Oral  Oral  SpO2: 100% 98% 96% 98%  Weight:      Height:        Intake/Output Summary (Last 24 hours) at 10/01/2019 2216 Last data filed at 10/01/2019 1751 Gross per 24 hour  Intake 1566.8 ml  Output 1000 ml  Net 566.8 ml   Filed Weights   09/30/19 1206 09/30/19 2335  Weight: 68 kg 70.8 kg    Examination:   Constitutional: NAD, AAOx3 HEENT: conjunctivae and lids normal, EOMI CV: RRR no M,R,G. Distal pulses +2.  No cyanosis.   RESP: CTA B/L, normal respiratory effort  GI: +BS, NTND Extremities: No effusions, edema, or tenderness in BLE SKIN: warm, dry and intact Neuro: II - XII grossly intact.  Sensation intact Psych: Normal mood and affect.  Appropriate judgement and reason   Data Reviewed: I have personally reviewed following labs and imaging studies  CBC: Recent Labs  Lab 09/30/19 1243  10/01/19 0531  WBC 4.9 3.7*  HGB 10.4* 9.2*  HCT 33.9* 32.3*  MCV 84.8 90.2  PLT 183 409*   Basic Metabolic Panel: Recent Labs  Lab 09/30/19 1243 10/01/19 0531  NA 135 134*  K 3.2* 3.6  CL 108 107  CO2 17* 16*  GLUCOSE 416* 345*  BUN 26* 21  CREATININE 1.45* 1.23*  CALCIUM 8.8* 8.2*   GFR: Estimated Creatinine Clearance: 41 mL/min (A) (by C-G formula based on SCr of 1.23 mg/dL (H)). Liver Function Tests: Recent Labs    Lab 09/30/19 1243  AST 34  ALT 28  ALKPHOS 131*  BILITOT 1.1  PROT 7.5  ALBUMIN 3.5   Recent Labs  Lab 09/30/19 1243  LIPASE 16   No results for input(s): AMMONIA in the last 168 hours. Coagulation Profile: No results for input(s): INR, PROTIME in the last 168 hours. Cardiac Enzymes: No results for input(s): CKTOTAL, CKMB, CKMBINDEX, TROPONINI in the last 168 hours. BNP (last 3 results) No results for input(s): PROBNP in the last 8760 hours. HbA1C: No results for input(s): HGBA1C in the last 72 hours. CBG: Recent Labs  Lab 09/30/19 2336 10/01/19 0749 10/01/19 1144 10/01/19 1636 10/01/19 2102  GLUCAP 363* 306* 97 146* 210*   Lipid Profile: No results for input(s): CHOL, HDL, LDLCALC, TRIG, CHOLHDL, LDLDIRECT in the last 72 hours. Thyroid Function Tests: No results for input(s): TSH, T4TOTAL, FREET4, T3FREE, THYROIDAB in the last 72 hours. Anemia Panel: No results for input(s): VITAMINB12, FOLATE, FERRITIN, TIBC, IRON, RETICCTPCT in the last 72 hours. Sepsis Labs: No results for input(s): PROCALCITON, LATICACIDVEN in the last 168 hours.  Recent Results (from the past 240 hour(s))  SARS CORONAVIRUS 2 (TAT 6-24 HRS) Nasopharyngeal Nasopharyngeal Swab     Status: None   Collection Time: 09/30/19  7:58 PM   Specimen: Nasopharyngeal Swab  Result Value Ref Range Status   SARS Coronavirus 2 NEGATIVE NEGATIVE Final    Comment: (NOTE) SARS-CoV-2 target nucleic acids are NOT DETECTED. The SARS-CoV-2 RNA is generally detectable in upper and lower respiratory specimens during the acute phase of infection. Negative results do not preclude SARS-CoV-2 infection, do not rule out co-infections with other pathogens, and should not be used as the sole basis for treatment or other patient management decisions. Negative results must be combined with clinical observations, patient history, and epidemiological information. The expected result is Negative. Fact Sheet for  Patients: SugarRoll.be Fact Sheet for Healthcare Providers: https://www.woods-mathews.com/ This test is not yet approved or cleared by the [place] FDA and  has been authorized for detection and/or diagnosis of SARS-CoV-2 by FDA under an Emergency Use Authorization (EUA). This EUA will remain  in effect (meaning this test can be used) for the duration of the COVID-19 declaration under Section 56 4(b)(1) of the Act, 21 U.S.C. section 360bbb-3(b)(1), unless the authorization is terminated or revoked sooner. Performed at [facility], [place] [address] [place], [place] [address]       Radiology Studies: CT Abdomen Pelvis W Contrast  Result Date: 09/30/2019 CLINICAL DATA:  Diffuse abdominal pain. EXAM: CT ABDOMEN AND PELVIS WITH CONTRAST TECHNIQUE: Multidetector CT imaging of the abdomen and pelvis was performed using the standard protocol following bolus administration of intravenous contrast. CONTRAST:  46m OMNIPAQUE IOHEXOL 300 MG/ML  SOLN COMPARISON:  05/16/2019 FINDINGS: Lower chest: The lung bases are clear. The heart size is normal. Hepatobiliary: The liver is cirrhotic. Normal gallbladder.There is mild extrahepatic biliary ductal dilatation, likely representing a reservoir fact. This  is stable from prior study. Pancreas: Normal contours without ductal dilatation. No peripancreatic fluid collection. Spleen: The spleen is enlarged. Adrenals/Urinary Tract: --Adrenal glands: No adrenal hemorrhage. --Right kidney/ureter: The right kidney is atrophic when compared to the right. There is diffuse wall thickening throughout the right ureter with mild right-sided hydroureteronephrosis. --Left kidney/ureter: There is a punctate nonobstructing stone in the interpolar region of the left kidney. --Urinary bladder: There is asymmetric bladder wall thickening, especially on the left. Stomach/Bowel: --Stomach/Duodenum: No hiatal hernia or other gastric  abnormality. Normal duodenal course and caliber. --Small bowel: No dilatation or inflammation. --Colon: There is a moderate amount of stool in the colon. --Appendix: Normal. Vascular/Lymphatic: Atherosclerotic calcification is present within the non-aneurysmal abdominal aorta, without hemodynamically significant stenosis. Esophageal varices are noted. Portosystemic shunting is noted. --No retroperitoneal lymphadenopathy. --No mesenteric lymphadenopathy. --No pelvic or inguinal lymphadenopathy. Reproductive: Status post hysterectomy. No adnexal mass. Other: No ascites or free air. There is a trace amount of free fluid in the patient's abdomen. Musculoskeletal. No acute displaced fractures. IMPRESSION: 1. Diffuse wall thickening of the right ureter with mild right-sided hydroureteronephrosis. Findings may represent an ascending urinary tract infection in the appropriate clinical setting. 2. Asymmetric bladder wall thickening. Correlation with urinalysis is recommended. 3. Cirrhosis with sequela of portal hypertension as detailed above. 4. Moderate stool burden. Aortic Atherosclerosis (ICD10-I70.0). Electronically Signed   By: Constance Holster M.D.   On: 09/30/2019 19:25   DG Chest Portable 1 View  Result Date: 09/30/2019 CLINICAL DATA:  Leg and low back pain EXAM: PORTABLE CHEST 1 VIEW COMPARISON:  08/26/2019 FINDINGS: The heart size and mediastinal contours are within normal limits. Aortic atherosclerosis. Both lungs are clear. The visualized skeletal structures are unremarkable. IMPRESSION: No active disease. Electronically Signed   By: Donavan Foil M.D.   On: 09/30/2019 18:42     Scheduled Meds: . docusate sodium  100 mg Oral BID  . enoxaparin (LOVENOX) injection  40 mg Subcutaneous Q24H  . insulin aspart  0-15 Units Subcutaneous TID WC  . insulin detemir  30 Units Subcutaneous BID  . metoprolol tartrate  25 mg Oral BID  . midodrine  5 mg Oral TID WC  . sertraline  25 mg Oral QHS  . sodium  chloride flush  3 mL Intravenous Q12H   Continuous Infusions: . sodium chloride    . sodium chloride 100 mL/hr at 10/01/19 2029  . cefTRIAXone (ROCEPHIN)  IV 1 g (10/01/19 0054)     LOS: 1 day     Enzo Bi, MD [facility] If 7PM-7AM, please contact night-coverage 10/01/2019, 10:16 PM

## 2019-10-01 NOTE — Telephone Encounter (Signed)
Notified [name] of [name] comments.

## 2019-10-02 LAB — GLUCOSE, CAPILLARY
Glucose-Capillary: 139 mg/dL — ABNORMAL HIGH (ref 70–99)
Glucose-Capillary: 183 mg/dL — ABNORMAL HIGH (ref 70–99)
Glucose-Capillary: 160 mg/dL — ABNORMAL HIGH (ref 70–99)
Glucose-Capillary: 170 mg/dL — ABNORMAL HIGH (ref 70–99)

## 2019-10-02 LAB — CBC
HCT: 31.2 % — ABNORMAL LOW (ref 36.0–46.0)
Hemoglobin: 9.6 g/dL — ABNORMAL LOW (ref 12.0–15.0)
MCH: 26.1 pg (ref 26.0–34.0)
MCHC: 30.8 g/dL (ref 30.0–36.0)
MCV: 84.8 fL (ref 80.0–100.0)
Platelets: 162 10*3/uL (ref 150–400)
RBC: 3.68 MIL/uL — ABNORMAL LOW (ref 3.87–5.11)
RDW: 15.9 % — ABNORMAL HIGH (ref 11.5–15.5)
WBC: 5.5 10*3/uL (ref 4.0–10.5)
nRBC: 0 % (ref 0.0–0.2)

## 2019-10-02 LAB — BASIC METABOLIC PANEL
Anion gap: 7 (ref 5–15)
BUN: 16 mg/dL (ref 8–23)
CO2: 19 mmol/L — ABNORMAL LOW (ref 22–32)
Calcium: 8.1 mg/dL — ABNORMAL LOW (ref 8.9–10.3)
Chloride: 110 mmol/L (ref 98–111)
Creatinine, Ser: 1.07 mg/dL — ABNORMAL HIGH (ref 0.44–1.00)
GFR calc Af Amer: >60 mL/min (ref >60)
GFR calc non Af Amer: 54 mL/min — ABNORMAL LOW (ref >60)
Glucose, Bld: 169 mg/dL — ABNORMAL HIGH (ref 70–99)
Potassium: 3.8 mmol/L (ref 3.5–5.1)
Sodium: 136 mmol/L (ref 135–145)

## 2019-10-02 LAB — URINE CULTURE: Culture: <10000 — AB

## 2019-10-02 LAB — MAGNESIUM: Magnesium: 1.8 mg/dL (ref 1.7–2.4)

## 2019-10-02 NOTE — Evaluation (Signed)
Physical Therapy Evaluation Patient Details Name: [name] MRN: [id number] DOB: 10-06-1955 Today's Date: 10/02/2019   History of Present Illness  [name] is a 27yoF who comes to [facility] on 1/[facility] c ABD pain, back pain, diarrhea, weakness, and fatigue. In ED complaining of lower back pain pain radiating to the left leg, CT scan abdomen and pelvis diffuse for thickening of the right ureter with mild right-sided hydroureteronephrosis. PMH: AF, CVA, diabetes, hypertension, chronic kidney disease, chronic pain, cirrhosis. Per RNCM note: Pt is independent with her ADL's but is falling at home. She attributes this to the CVA she had last year and the residual weakness she has on her left side.  Clinical Impression  Pt admitted with above diagnosis. Pt currently with functional limitations due to the deficits listed below (see "PT Problem List"). Upon entry, pt in bed, awake and agreeable to participate. The pt is alert and oriented x4, pleasant, conversational, and generally a good historian. All bsic mobility performed at supervision level, pt using a RW for AMB in room. Pt reports to feel 'great' and is near her PTA level of function, would like to resume HHPT at DC. Patient is at baseline, all education completed, and time is given to address all questions/concerns. No additional skilled PT services needed at this time, PT signing off. PT recommends daily ambulation ad lib or with nursing staff as needed to prevent deconditioning.      Follow Up Recommendations Home health PT    Equipment Recommendations  None recommended by PT    Recommendations for Other Services       Precautions / Restrictions Precautions Precautions: Fall Restrictions Weight Bearing Restrictions: No      Mobility  Bed Mobility Overal bed mobility: Modified Independent                Transfers Overall transfer level: Modified independent Equipment used: Rolling walker (2 wheeled)                 Ambulation/Gait Ambulation/Gait assistance: Supervision Gait Distance (Feet): 75 Feet Assistive device: Rolling walker (2 wheeled) Gait Pattern/deviations: WFL(Within Functional Limits)        Stairs            Wheelchair Mobility    Modified Rankin (Stroke Patients Only)       Balance Overall balance assessment: Modified Independent;Mild deficits observed, not formally tested;History of Falls                                           Pertinent Vitals/Pain Pain Assessment: No/denies pain    Home Living Family/patient expects to be discharged to:: Private residence Living Arrangements: Spouse/significant other;Children;Other relatives Available Help at Discharge: Family;Available 24 hours/day Type of Home: Mobile home Home Access: Ramped entrance     Home Layout: One level Home Equipment: [place] - 2 wheels;Cane - quad;Cane - single point;Bedside commode;Walker - 4 wheels Additional Comments: Pt reports that her husband helps with household IADLs such as cooking, cleaning and laundry. They also recieve meals on wheels for food assitance. States she has PCA that helps get groceries and drives pt or husband when needed. States son can also help some. Was supposed to start HHPT yesterday or today, but post-poned as pt has returned to hospital d/t several falls.    Prior Function Level of Independence: Independent with assistive device(s)  Comments: Pt reports she used 2WW for short household distances, has BSC that she uses at night. States she has recliner she sits in most of day. She is usually able to make it to the restroom, sometimes she needs her husband's support to make it back to bedroom with 2WW in the evening. States she sponge bathes in sitting.     Hand Dominance   Dominant Hand: Right    Extremity/Trunk Assessment                Communication   Communication: No difficulties  Cognition                                               General Comments      Exercises     Assessment/Plan    PT Assessment Patent does not need any further PT services;All further PT needs can be met in the next venue of care  PT Problem List Decreased strength;Decreased activity tolerance;Decreased mobility       PT Treatment Interventions      PT Goals (Current goals can be found in the Care Plan section)  Acute Rehab PT Goals PT Goal Formulation: All assessment and education complete, DC therapy    Frequency     Barriers to discharge        Co-evaluation               AM-PAC PT "6 Clicks" Mobility  Outcome Measure Help needed turning from your back to your side while in a flat bed without using bedrails?: None Help needed moving from lying on your back to sitting on the side of a flat bed without using bedrails?: None Help needed moving to and from a bed to a chair (including a wheelchair)?: None Help needed standing up from a chair using your arms (e.g., wheelchair or bedside chair)?: None Help needed to walk in hospital room?: A Little Help needed climbing 3-5 steps with a railing? : A Little 6 Click Score: 22    End of Session   Activity Tolerance: Patient tolerated treatment well;No increased pain Patient left: in bed;with call bell/phone within reach Nurse Communication: Mobility status PT Visit Diagnosis: Other abnormalities of gait and mobility (R26.89);Muscle weakness (generalized) (M62.81);Difficulty in walking, not elsewhere classified (R26.2)    Time: 0569-7948 PT Time Calculation (min) (ACUTE ONLY): 24 min   Charges:   PT Evaluation $PT Eval Low Complexity: 1 Low PT Treatments $Therapeutic Exercise: 8-22 mins        12:11 PM, 10/02/19 Etta Grandchild, PT, DPT Physical Therapist - [facility]  [phone] ([place])    [place] C 10/02/2019, 12:09 PM

## 2019-10-02 NOTE — Care Management Important Message (Signed)
Important Message  Patient Details  Name: [name] MRN: [id number] Date of Birth: 07-Jun-1956   Medicare Important Message Given:  N/A - LOS <3 / Initial given by admissions     Dannette Cristabel 10/02/2019, 1:11 PM

## 2019-10-02 NOTE — Progress Notes (Addendum)
PROGRESS NOTE    [name]  GYB:[id number] DOB: 25-Aug-1956 DOA: 09/30/2019 PCP: [name], NP    Assessment & Plan:   Principal Problem:   Acute pyelonephritis Active Problems:   DEPRESSION/ANXIETY   Chronic pain syndrome   GERD   Essential hypertension   CVA (cerebral vascular accident) ([place])   Hypokalemia   Hyperlipidemia with target LDL less than 100   Narcotic abuse ([place])   Uncontrolled type 2 diabetes mellitus ([place])   Atrial fibrillation ([place])   H/O TIA (transient ischemic attack) and stroke   History of hypothyroidism   Type 2 diabetes mellitus with neurologic complication ([place])    [name] is a 64 y.o. female with medical history significant of chronic atrial fibrillation, CVA, diabetes, hypertension, chronic kidney disease, chronic pain, cirrhosis, came with a chief complaint of back pain diarrhea, general weakness,   In the emergency room patient in mild distress complaining of lower back pain pain radiating to the left leg.  CT scan abdomen and pelvis diffuse for thickening of the right ureter with mild right-sided hydroureteronephrosis.  Patient started on Rocephin IV  Right flank pain abdominal pain 2/2 Pyelonephritis multiple hospitalizations for UTI CT scan thickening of the ureteral wall but no evidence of obstruction consistent with pyelonephritis --urine cx so far insignificant growth --continue Rocephin   history of a right ureteral stone treated by Dr. Tresa Moore in November 2017 with stent placement, who was unfortunately lost to follow-up and her stent remains in place. --ureteral stent finally removed on 08/24/2019 after 3 years  Lower back pain With left leg Radiculopathy history of chronic pain --d/c IV morphine --home oxy 5 q6h PRN  Diarrhea, weakness, fatigue cough  for the last 10 days,  Rapid test negative --C diff --d/c MIVF   History of atrial fibrillation Rate controlled anticoagulant discontinued by outpatient  physician due to falls --continue Lopressor  Diabetes mellitus type 2 with hyperglycemia Continue Levemir Insulin sliding scale per sensitivity factor  Chronic kidney disease stage IIIb Avoid nephrotoxins follow BUN and creatinine  Cirrhosis Stable  Frequent falls --PT   DVT prophylaxis: Lovenox SQ Code Status: DNR  Family Communication: not today Disposition Plan: Home tomorrow   Subjective and Interval History:  Pt reported feeling much better today, the pain in her left side and left leg much improved also, and pt said she even got out of bed and walked around without difficulty.  Pt asked to go home even.  No more dysuria.  No fever, dyspnea, chest pain, abdominal pain, N/V, increased swelling.   Objective: Vitals:   10/01/19 1702 10/01/19 2100 10/02/19 0417 10/02/19 1157  BP: (!) 149/66 (!) 145/82 (!) 158/93 (!) 151/70  Pulse: 84 85 87 87  Resp:  16 16 18   Temp:  98.4 F (36.9 C) 98.6 F (37 C) 98.6 F (37 C)  TempSrc:  Oral Oral Oral  SpO2: 96% 98% 100% 98%  Weight:      Height:        Intake/Output Summary (Last 24 hours) at 10/02/2019 1930 Last data filed at 10/02/2019 1810 Gross per 24 hour  Intake 1355 ml  Output 1250 ml  Net 105 ml   Filed Weights   09/30/19 1206 09/30/19 2335  Weight: 68 kg 70.8 kg    Examination:   Constitutional: NAD, AAOx3 HEENT: conjunctivae and lids normal, EOMI CV: RRR no M,R,G. Distal pulses +2.  No cyanosis.   RESP: CTA B/L, normal respiratory effort  GI: +BS, NTND  Extremities: No effusions, edema, or tenderness in BLE SKIN: warm, dry and intact Neuro: II - XII grossly intact.  Sensation intact Psych: Normal mood and affect.  Appropriate judgement and reason   Data Reviewed: I have personally reviewed following labs and imaging studies  CBC: Recent Labs  Lab 09/30/19 1243 10/01/19 0531 10/02/19 0716  WBC 4.9 3.7* 5.5  HGB 10.4* 9.2* 9.6*  HCT 33.9* 32.3* 31.2*  MCV 84.8 90.2 84.8  PLT 183 142* 782    Basic Metabolic Panel: Recent Labs  Lab 09/30/19 1243 10/01/19 0531 10/02/19 0716  NA 135 134* 136  K 3.2* 3.6 3.8  CL 108 107 110  CO2 17* 16* 19*  GLUCOSE 416* 345* 169*  BUN 26* 21 16  CREATININE 1.45* 1.23* 1.07*  CALCIUM 8.8* 8.2* 8.1*  MG  --   --  1.8   GFR: Estimated Creatinine Clearance: 47.2 mL/min (A) (by C-G formula based on SCr of 1.07 mg/dL (H)). Liver Function Tests: Recent Labs  Lab 09/30/19 1243  AST 34  ALT 28  ALKPHOS 131*  BILITOT 1.1  PROT 7.5  ALBUMIN 3.5   Recent Labs  Lab 09/30/19 1243  LIPASE 16   No results for input(s): AMMONIA in the last 168 hours. Coagulation Profile: No results for input(s): INR, PROTIME in the last 168 hours. Cardiac Enzymes: No results for input(s): CKTOTAL, CKMB, CKMBINDEX, TROPONINI in the last 168 hours. BNP (last 3 results) No results for input(s): PROBNP in the last 8760 hours. HbA1C: No results for input(s): HGBA1C in the last 72 hours. CBG: Recent Labs  Lab 10/01/19 1636 10/01/19 2102 10/02/19 0805 10/02/19 1155 10/02/19 1638  GLUCAP 146* 210* 139* 183* 160*   Lipid Profile: No results for input(s): CHOL, HDL, LDLCALC, TRIG, CHOLHDL, LDLDIRECT in the last 72 hours. Thyroid Function Tests: No results for input(s): TSH, T4TOTAL, FREET4, T3FREE, THYROIDAB in the last 72 hours. Anemia Panel: No results for input(s): VITAMINB12, FOLATE, FERRITIN, TIBC, IRON, RETICCTPCT in the last 72 hours. Sepsis Labs: No results for input(s): PROCALCITON, LATICACIDVEN in the last 168 hours.  Recent Results (from the past 240 hour(s))  Urine culture     Status: Abnormal   Collection Time: 09/30/19  3:03 PM   Specimen: Urine, Random  Result Value Ref Range Status   Specimen Description   Final    URINE, RANDOM Performed at [facility], [address], [place], [place] [address]    Special Requests   Final    NONE Performed at [facility], [address], [place], [place] [address]     Culture (A)  Final    <10,000 COLONIES/mL INSIGNIFICANT GROWTH Performed at [facility] [address] [place], [place] [address]    Report Status 10/02/2019 FINAL  Final  SARS CORONAVIRUS 2 (TAT 6-24 HRS) Nasopharyngeal Nasopharyngeal Swab     Status: None   Collection Time: 09/30/19  7:58 PM   Specimen: Nasopharyngeal Swab  Result Value Ref Range Status   SARS Coronavirus 2 NEGATIVE NEGATIVE Final    Comment: (NOTE) SARS-CoV-2 target nucleic acids are NOT DETECTED. The SARS-CoV-2 RNA is generally detectable in upper and lower respiratory specimens during the acute phase of infection. Negative results do not preclude SARS-CoV-2 infection, do not rule out co-infections with other pathogens, and should not be used as the sole basis for treatment or other patient management decisions. Negative results must be combined with clinical observations, patient history, and epidemiological information. The expected result is Negative.  Fact Sheet for Patients: SugarRoll.be Fact Sheet for Healthcare Providers: https://www.woods-mathews.com/ This test is not yet approved or cleared by the [place] FDA and  has been authorized for detection and/or diagnosis of SARS-CoV-2 by FDA under an Emergency Use Authorization (EUA). This EUA will remain  in effect (meaning this test can be used) for the duration of the COVID-19 declaration under Section 56 4(b)(1) of the Act, 21 U.S.C. section 360bbb-3(b)(1), unless the authorization is terminated or revoked sooner. Performed at [facility], [place] [address], [place], [place] [address]       Radiology Studies: No results found.   Scheduled Meds: . atorvastatin  40 mg Oral q1800  . docusate sodium  100 mg Oral BID  . enoxaparin (LOVENOX) injection  40 mg Subcutaneous Q24H  . insulin aspart  0-15 Units Subcutaneous TID WC  . insulin detemir  30 Units Subcutaneous BID  . metoprolol  tartrate  25 mg Oral BID  . sertraline  25 mg Oral QHS  . sodium chloride flush  3 mL Intravenous Q12H   Continuous Infusions: . sodium chloride    . sodium chloride 100 mL/hr at 10/02/19 0845  . cefTRIAXone (ROCEPHIN)  IV 1 g (10/01/19 2305)     LOS: 2 days     Enzo Bi, MD [facility] If 7PM-7AM, please contact night-coverage 10/02/2019, 7:30 PM

## 2019-10-03 LAB — BASIC METABOLIC PANEL
Anion gap: 7 (ref 5–15)
BUN: 13 mg/dL (ref 8–23)
CO2: 21 mmol/L — ABNORMAL LOW (ref 22–32)
Calcium: 8.1 mg/dL — ABNORMAL LOW (ref 8.9–10.3)
Chloride: 109 mmol/L (ref 98–111)
Creatinine, Ser: 1.01 mg/dL — ABNORMAL HIGH (ref 0.44–1.00)
GFR calc Af Amer: >60 mL/min (ref >60)
GFR calc non Af Amer: 58 mL/min — ABNORMAL LOW (ref >60)
Glucose, Bld: 118 mg/dL — ABNORMAL HIGH (ref 70–99)
Potassium: 3.4 mmol/L — ABNORMAL LOW (ref 3.5–5.1)
Sodium: 137 mmol/L (ref 135–145)

## 2019-10-03 LAB — CBC
HCT: 32.6 % — ABNORMAL LOW (ref 36.0–46.0)
Hemoglobin: 9.9 g/dL — ABNORMAL LOW (ref 12.0–15.0)
MCH: 25.8 pg — ABNORMAL LOW (ref 26.0–34.0)
MCHC: 30.4 g/dL (ref 30.0–36.0)
MCV: 84.9 fL (ref 80.0–100.0)
Platelets: 144 10*3/uL — ABNORMAL LOW (ref 150–400)
RBC: 3.84 MIL/uL — ABNORMAL LOW (ref 3.87–5.11)
RDW: 15.9 % — ABNORMAL HIGH (ref 11.5–15.5)
WBC: 4.6 10*3/uL (ref 4.0–10.5)
nRBC: 0 % (ref 0.0–0.2)

## 2019-10-03 LAB — MAGNESIUM: Magnesium: 1.7 mg/dL (ref 1.7–2.4)

## 2019-10-03 LAB — GLUCOSE, CAPILLARY: Glucose-Capillary: 200 mg/dL — ABNORMAL HIGH (ref 70–99)

## 2019-10-03 MED ORDER — CIPROFLOXACIN HCL 500 MG PO TABS
500.0000 mg | ORAL_TABLET | Freq: Two times a day (BID) | ORAL | Status: DC
  Administered 2019-10-03: 08:00:00 500 mg via ORAL
  Filled 2019-10-03: qty 1

## 2019-10-03 MED ORDER — MIDODRINE HCL 5 MG PO TABS: ORAL_TABLET | ORAL | 3 refills | Status: DC

## 2019-10-03 MED ORDER — CIPROFLOXACIN HCL 500 MG PO TABS: 500.0000 mg | ORAL_TABLET | Freq: Two times a day (BID) | ORAL | 0 refills | Status: AC

## 2019-10-03 MED ORDER — POTASSIUM CHLORIDE CRYS ER 20 MEQ PO TBCR
40.0000 meq | EXTENDED_RELEASE_TABLET | Freq: Once | ORAL | Status: AC
  Administered 2019-10-03: 40 meq via ORAL
  Filled 2019-10-03: qty 2

## 2019-10-03 MED ORDER — SERTRALINE HCL 25 MG PO TABS: 25.0000 mg | ORAL_TABLET | Freq: Every day | ORAL | Status: DC

## 2019-10-03 MED ORDER — INSULIN DETEMIR 100 UNIT/ML FLEXPEN: 30.0000 [IU] | PEN_INJECTOR | Freq: Two times a day (BID) | SUBCUTANEOUS | Status: DC

## 2019-10-03 NOTE — Progress Notes (Signed)
10/03/2019 11:32 AM  [name] to be D/C'd Home per MD order.  Discussed prescriptions and follow up appointments with the patient. Prescriptions given to patient, medication list explained in detail. Pt verbalized understanding.  Allergies as of 10/03/2019      Reactions   Aspirin Rash   Codeine Sulfate Rash   Erythromycin Rash   Reaction:  Fever    Prednisone Swelling   Rosiglitazone Maleate Swelling   Tetanus-diphtheria Toxoids Td Rash, Other (See Comments)   Reaction:  Fever       Medication List    TAKE these medications   albuterol 108 (90 Base) MCG/ACT inhaler Commonly known as: VENTOLIN HFA Inhale 2 puffs into the lungs every 6 (six) hours as needed for wheezing or shortness of breath. Notes to patient: As needed   atorvastatin 40 MG tablet Commonly known as: LIPITOR Take 40 mg by mouth daily. Notes to patient: Morning 10/04/19   cholecalciferol 25 MCG (1000 UNIT) tablet Commonly known as: VITAMIN D3 Take 1,000 Units by mouth daily. Notes to patient: Morning 10/04/19   ciprofloxacin 500 MG tablet Commonly known as: CIPRO Take 1 tablet (500 mg total) by mouth 2 (two) times daily for 6 days. Antibiotic for your kidney infection. Notes to patient: Morning 10/04/19   cyclobenzaprine 10 MG tablet Commonly known as: FLEXERIL Take 10 mg by mouth 3 (three) times daily as needed. Notes to patient: As needed   Insulin Detemir 100 UNIT/ML Pen Commonly known as: LEVEMIR Inject 30 Units into the skin 2 (two) times daily. Notes to patient: Before bed 10/03/19   magnesium gluconate 500 MG tablet Commonly known as: MAGONATE Take 500 mg by mouth 2 (two) times daily. Notes to patient: Before bed 10/03/19   metoprolol tartrate 25 MG tablet Commonly known as: LOPRESSOR Take 1 tablet (25 mg total) by mouth 2 (two) times daily. Notes to patient: Before bed 10/03/19   midodrine 5 MG tablet Commonly known as: PROAMATINE Hold this medication until outpatient PCP followup  because your blood pressure has been elevated during hospitalization. What changed:   how much to take  how to take this  when to take this  additional instructions Notes to patient: Hold until follow-up   nitroGLYCERIN 0.3 MG SL tablet Commonly known as: Nitrostat Place 1 tablet (0.3 mg total) under the tongue every 5 (five) minutes as needed for chest pain. Notes to patient: As needed   ondansetron 4 MG disintegrating tablet Commonly known as: Zofran ODT Take 1 tablet (4 mg total) by mouth every 8 (eight) hours as needed for nausea or vomiting. Notes to patient: As needed   oxyCODONE 5 MG immediate release tablet Commonly known as: Oxy IR/ROXICODONE Take 5 mg by mouth every 4 (four) hours as needed. Notes to patient: As needed after 12:30 this afternoon   sertraline 25 MG tablet Commonly known as: ZOLOFT Take 1 tablet (25 mg total) by mouth at bedtime. Notes to patient: Before bed 10/03/19   sucralfate 1 g tablet Commonly known as: CARAFATE Take 1 g by mouth 4 (four) times daily -  with meals and at bedtime. Notes to patient: With evening meal 10/03/19       Vitals:   10/03/19 0401 10/03/19 0607  BP: (!) 171/87 (!) 154/81  Pulse: 87 91  Resp: 16   Temp: 98.3 F (36.8 C)   SpO2: 98%     Skin clean, dry and intact without evidence of skin break down, no evidence of skin tears noted.  IV catheter discontinued intact. Site without signs and symptoms of complications. Dressing and pressure applied. Pt denies pain at this time. No complaints noted.  An After Visit Summary was printed and given to the patient. Patient escorted via [place], and D/C home via private auto.  [name]

## 2019-10-04 NOTE — Discharge Summary (Signed)
Physician Discharge Summary   [name]  female DOB: 08/24/1956  FFM:[id number]  PCP: [name], NP  Admit date: 09/30/2019 Discharge date: 10/03/2019  Admitted From: home Disposition:  home Home Health: Yes  Continue pre-existing HHPT orders CODE STATUS: Full code  Discharge Instructions    Diet - low sodium heart healthy   Complete by: As directed    Discharge instructions   Complete by: As directed    You have received 4 days of IV antibiotic Ceftriaxone for your kidney infection.  Please finish 6 more days of antibiotic with Cipro.  Please follow up with your urologist because you have some mild urine backup to the right kidney from your abnormal right ureter (where you previously had a stent in for 3 years).  Dr. Enzo Bi - -   Increase activity slowly   Complete by: As directed        Hospital Course:  For full details, please see H&P, progress notes, consult notes and ancillary notes.  Briefly,  Rita Mccomb Pooleis a 64 y.o.Caucasian femalewith medical history significant ofchronic atrial fibrillation, CVA, diabetes, hypertension, chronic kidney disease, chronic pain, cirrhosis,came with a chief complaint of back pain diarrhea, general weakness.   In the emergency room patient in mild distress complaining of lower back pain pain radiating to the left leg.  CT scan abdomen and pelvis diffuse for thickening of the right ureter with mild right-sided hydroureteronephrosis.  Patient started on Rocephin IV  Right flank pain abdominal pain 2/2 Pyelonephritis multiple hospitalizations for UTI CT scan thickening of the ureteral wall but no evidence of obstruction.  Finding consistent with pyelonephritis.  Urine cx so far insignificant growth.  Pt received 4 days of IV antibiotic Ceftriaxone and will finish 6 more days of antibiotic with Cipro at home.  Pt felt much better prior to discharge.  Pt was advised to follow up with urologist as  outpatient.  history of a right ureteral stone treated by Dr. Tresa Moore in November 2017 with stent placement Pt was unfortunately lost to follow-up and her stent remained in place for 3 years and  finally removed on 08/24/2019.  Lower back pain With left leg Radiculopathy history of chronic pain Home oxy 5 q6h PRN continued.  Pt had HHPT ordered by her PCP, which should be continued.  Diarrhea, weakness, fatigue, improved COVID-10 neg.  Diarrhea resolved after presentation.  Pt received MIVF and then was able to maintain hydration with via PO.  History of atrial fibrillation Rate controlled anticoagulant discontinued by outpatient physician due to falls.  Continued home Lopressor  Diabetes mellitus type 2 with hyperglycemia Continued home Levemir. Insulin sliding scale per sensitivity factor  Chronic kidney disease stage IIIb, stable  Hx of Cirrhosis, Stable  Frequent falls Pt had HHPT ordered by her PCP, which should be continued.    Discharge Diagnoses:  Principal Problem:   Acute pyelonephritis Active Problems:   DEPRESSION/ANXIETY   Chronic pain syndrome   GERD   Essential hypertension   CVA (cerebral vascular accident) ([place])   Hypokalemia   Hyperlipidemia with target LDL less than 100   Narcotic abuse ([place])   Uncontrolled type 2 diabetes mellitus ([place])   Atrial fibrillation ([place])   H/O TIA (transient ischemic attack) and stroke   History of hypothyroidism   Type 2 diabetes mellitus with neurologic complication [facility])    Discharge Instructions:  Allergies as of 10/03/2019      Reactions   Aspirin Rash   Codeine Sulfate  Rash   Erythromycin Rash   Reaction:  Fever    Prednisone Swelling   Rosiglitazone Maleate Swelling   Tetanus-diphtheria Toxoids Td Rash, Other (See Comments)   Reaction:  Fever       Medication List    TAKE these medications   albuterol 108 (90 Base) MCG/ACT inhaler Commonly known as: VENTOLIN HFA Inhale 2 puffs into the  lungs every 6 (six) hours as needed for wheezing or shortness of breath. Notes to patient: As needed   atorvastatin 40 MG tablet Commonly known as: LIPITOR Take 40 mg by mouth daily. Notes to patient: Morning 10/04/19   cholecalciferol 25 MCG (1000 UNIT) tablet Commonly known as: VITAMIN D3 Take 1,000 Units by mouth daily. Notes to patient: Morning 10/04/19   ciprofloxacin 500 MG tablet Commonly known as: CIPRO Take 1 tablet (500 mg total) by mouth 2 (two) times daily for 6 days. Antibiotic for your kidney infection. Notes to patient: Morning 10/04/19   cyclobenzaprine 10 MG tablet Commonly known as: FLEXERIL Take 10 mg by mouth 3 (three) times daily as needed. Notes to patient: As needed   Insulin Detemir 100 UNIT/ML Pen Commonly known as: LEVEMIR Inject 30 Units into the skin 2 (two) times daily. Notes to patient: Before bed 10/03/19   magnesium gluconate 500 MG tablet Commonly known as: MAGONATE Take 500 mg by mouth 2 (two) times daily. Notes to patient: Before bed 10/03/19   metoprolol tartrate 25 MG tablet Commonly known as: LOPRESSOR Take 1 tablet (25 mg total) by mouth 2 (two) times daily. Notes to patient: Before bed 10/03/19   midodrine 5 MG tablet Commonly known as: PROAMATINE Hold this medication until outpatient PCP followup because your blood pressure has been elevated during hospitalization. What changed:   how much to take  how to take this  when to take this  additional instructions Notes to patient: Hold until follow-up   nitroGLYCERIN 0.3 MG SL tablet Commonly known as: Nitrostat Place 1 tablet (0.3 mg total) under the tongue every 5 (five) minutes as needed for chest pain. Notes to patient: As needed   ondansetron 4 MG disintegrating tablet Commonly known as: Zofran ODT Take 1 tablet (4 mg total) by mouth every 8 (eight) hours as needed for nausea or vomiting. Notes to patient: As needed   oxyCODONE 5 MG immediate release tablet Commonly  known as: Oxy IR/ROXICODONE Take 5 mg by mouth every 4 (four) hours as needed. Notes to patient: As needed after 12:30 this afternoon   sertraline 25 MG tablet Commonly known as: ZOLOFT Take 1 tablet (25 mg total) by mouth at bedtime. Notes to patient: Before bed 10/03/19   sucralfate 1 g tablet Commonly known as: CARAFATE Take 1 g by mouth 4 (four) times daily -  with meals and at bedtime. Notes to patient: With evening meal 10/03/19       Follow-up Information    [name], NP. Schedule an appointment as soon as possible for a visit.   Specialty: Internal Medicine Why: 3-5 days from discharge Contact information: [place] [address] [phone]        Minna Merritts, MD .   Specialty: Cardiology Contact information: [address] [place] [address] [phone]           Allergies  Allergen Reactions  . Aspirin Rash  . Codeine Sulfate Rash  . Erythromycin Rash    Reaction:  Fever   . Prednisone Swelling  .  Rosiglitazone Maleate Swelling  . Tetanus-Diphtheria Toxoids Td Rash and Other (See Comments)    Reaction:  Fever      The results of significant diagnostics from this hospitalization (including imaging, microbiology, ancillary and laboratory) are listed below for reference.   Consultations:   Procedures/Studies: CT Abdomen Pelvis W Contrast  Result Date: 09/30/2019 CLINICAL DATA:  Diffuse abdominal pain. EXAM: CT ABDOMEN AND PELVIS WITH CONTRAST TECHNIQUE: Multidetector CT imaging of the abdomen and pelvis was performed using the standard protocol following bolus administration of intravenous contrast. CONTRAST:  23m OMNIPAQUE IOHEXOL 300 MG/ML  SOLN COMPARISON:  05/16/2019 FINDINGS: Lower chest: The lung bases are clear. The heart size is normal. Hepatobiliary: The liver is cirrhotic. Normal gallbladder.There is mild extrahepatic biliary ductal dilatation, likely representing a reservoir fact. This is  stable from prior study. Pancreas: Normal contours without ductal dilatation. No peripancreatic fluid collection. Spleen: The spleen is enlarged. Adrenals/Urinary Tract: --Adrenal glands: No adrenal hemorrhage. --Right kidney/ureter: The right kidney is atrophic when compared to the right. There is diffuse wall thickening throughout the right ureter with mild right-sided hydroureteronephrosis. --Left kidney/ureter: There is a punctate nonobstructing stone in the interpolar region of the left kidney. --Urinary bladder: There is asymmetric bladder wall thickening, especially on the left. Stomach/Bowel: --Stomach/Duodenum: No hiatal hernia or other gastric abnormality. Normal duodenal course and caliber. --Small bowel: No dilatation or inflammation. --Colon: There is a moderate amount of stool in the colon. --Appendix: Normal. Vascular/Lymphatic: Atherosclerotic calcification is present within the non-aneurysmal abdominal aorta, without hemodynamically significant stenosis. Esophageal varices are noted. Portosystemic shunting is noted. --No retroperitoneal lymphadenopathy. --No mesenteric lymphadenopathy. --No pelvic or inguinal lymphadenopathy. Reproductive: Status post hysterectomy. No adnexal mass. Other: No ascites or free air. There is a trace amount of free fluid in the patient's abdomen. Musculoskeletal. No acute displaced fractures. IMPRESSION: 1. Diffuse wall thickening of the right ureter with mild right-sided hydroureteronephrosis. Findings may represent an ascending urinary tract infection in the appropriate clinical setting. 2. Asymmetric bladder wall thickening. Correlation with urinalysis is recommended. 3. Cirrhosis with sequela of portal hypertension as detailed above. 4. Moderate stool burden. Aortic Atherosclerosis (ICD10-I70.0). Electronically Signed   By: CConstance HolsterM.D.   On: 09/30/2019 19:25   DG Chest Portable 1 View  Result Date: 09/30/2019 CLINICAL DATA:  Leg and low back pain  EXAM: PORTABLE CHEST 1 VIEW COMPARISON:  08/26/2019 FINDINGS: The heart size and mediastinal contours are within normal limits. Aortic atherosclerosis. Both lungs are clear. The visualized skeletal structures are unremarkable. IMPRESSION: No active disease. Electronically Signed   By: KDonavan FoilM.D.   On: 09/30/2019 18:42      Labs: BNP (last 3 results) No results for input(s): BNP in the last 8760 hours. Basic Metabolic Panel: Recent Labs  Lab 09/30/19 1243 10/01/19 0531 10/02/19 0716 10/03/19 0448  NA 135 134* 136 137  K 3.2* 3.6 3.8 3.4*  CL 108 107 110 109  CO2 17* 16* 19* 21*  GLUCOSE 416* 345* 169* 118*  BUN 26* 21 16 13   CREATININE 1.45* 1.23* 1.07* 1.01*  CALCIUM 8.8* 8.2* 8.1* 8.1*  MG  --   --  1.8 1.7   Liver Function Tests: Recent Labs  Lab 09/30/19 1243  AST 34  ALT 28  ALKPHOS 131*  BILITOT 1.1  PROT 7.5  ALBUMIN 3.5   Recent Labs  Lab 09/30/19 1243  LIPASE 16   No results for input(s): AMMONIA in the last 168 hours. CBC: Recent Labs  Lab 09/30/19  1243 10/01/19 0531 10/02/19 0716 10/03/19 0448  WBC 4.9 3.7* 5.5 4.6  HGB 10.4* 9.2* 9.6* 9.9*  HCT 33.9* 32.3* 31.2* 32.6*  MCV 84.8 90.2 84.8 84.9  PLT 183 142* 162 144*   Cardiac Enzymes: No results for input(s): CKTOTAL, CKMB, CKMBINDEX, TROPONINI in the last 168 hours. BNP: Invalid input(s): POCBNP CBG: Recent Labs  Lab 10/02/19 0805 10/02/19 1155 10/02/19 1638 10/02/19 2120 10/03/19 0811  GLUCAP 139* 183* 160* 170* 200*   D-Dimer No results for input(s): DDIMER in the last 72 hours. Hgb A1c No results for input(s): HGBA1C in the last 72 hours. Lipid Profile No results for input(s): CHOL, HDL, LDLCALC, TRIG, CHOLHDL, LDLDIRECT in the last 72 hours. Thyroid function studies No results for input(s): TSH, T4TOTAL, T3FREE, THYROIDAB in the last 72 hours.  Invalid input(s): FREET3 Anemia work up No results for input(s): VITAMINB12, FOLATE, FERRITIN, TIBC, IRON, RETICCTPCT in  the last 72 hours. Urinalysis    Component Value Date/Time   COLORURINE YELLOW (A) 09/30/2019 1207   APPEARANCEUR CLOUDY (A) 09/30/2019 1207   APPEARANCEUR Cloudy (A) 08/24/2019 1104   LABSPEC 1.018 09/30/2019 1207   LABSPEC 1.033 10/08/2014 0144   PHURINE 6.0 09/30/2019 1207   GLUCOSEU >=500 (A) 09/30/2019 1207   GLUCOSEU >=500 10/08/2014 0144   HGBUR LARGE (A) 09/30/2019 1207   BILIRUBINUR NEGATIVE 09/30/2019 1207   BILIRUBINUR Negative 08/24/2019 1104   BILIRUBINUR Negative 10/08/2014 0144   KETONESUR NEGATIVE 09/30/2019 1207   PROTEINUR 100 (A) 09/30/2019 1207   UROBILINOGEN 0.2 11/26/2018 1431   UROBILINOGEN 0.2 05/27/2015 1936   NITRITE NEGATIVE 09/30/2019 1207   LEUKOCYTESUR LARGE (A) 09/30/2019 1207   LEUKOCYTESUR Negative 10/08/2014 0144   Sepsis Labs Invalid input(s): PROCALCITONIN,  WBC,  LACTICIDVEN Microbiology Recent Results (from the past 240 hour(s))  Urine culture     Status: Abnormal   Collection Time: 09/30/19  3:03 PM   Specimen: Urine, Random  Result Value Ref Range Status   Specimen Description   Final    URINE, RANDOM Performed at [facility], [address], [place], [place] [address]    Special Requests   Final    NONE Performed at [facility], [address], [place], [place] [address]    Culture (A)  Final    <10,000 COLONIES/mL INSIGNIFICANT GROWTH Performed at [facility], [place] [address] [place], [place] [address]    Report Status 10/02/2019 FINAL  Final  SARS CORONAVIRUS 2 (TAT 6-24 HRS) Nasopharyngeal Nasopharyngeal Swab     Status: None   Collection Time: 09/30/19  7:58 PM   Specimen: Nasopharyngeal Swab  Result Value Ref Range Status   SARS Coronavirus 2 NEGATIVE NEGATIVE Final    Comment: (NOTE) SARS-CoV-2 target nucleic acids are NOT DETECTED. The SARS-CoV-2 RNA is generally detectable in upper and lower respiratory specimens during the acute phase of infection. Negative results do not preclude  SARS-CoV-2 infection, do not rule out co-infections with other pathogens, and should not be used as the sole basis for treatment or other patient management decisions. Negative results must be combined with clinical observations, patient history, and epidemiological information. The expected result is Negative. Fact Sheet for Patients: SugarRoll.be Fact Sheet for Healthcare Providers: https://www.woods-mathews.com/ This test is not yet approved or cleared by the [place] FDA and  has been authorized for detection and/or diagnosis of SARS-CoV-2 by FDA under an Emergency Use Authorization (EUA). This EUA will remain  in effect (meaning this test can be used) for  the duration of the COVID-19 declaration under Section 56 4(b)(1) of the Act, 21 U.S.C. section 360bbb-3(b)(1), unless the authorization is terminated or revoked sooner. Performed at [facility], [place] [address] [place], [place] [address]      Total time spend on discharging this patient, including the last patient exam, discussing the hospital stay, instructions for ongoing care as it relates to all pertinent caregivers, as well as preparing the medical discharge records, prescriptions, and/or referrals as applicable, is 30 minutes.    Enzo Bi, MD  [facility] 10/04/2019, 8:39 PM  If 7PM-7AM, please contact night-coverage

## 2019-10-06 ENCOUNTER — Encounter: Payer: Self-pay | Admitting: Urology

## 2019-10-06 ENCOUNTER — Telehealth: Payer: Self-pay

## 2019-10-06 ENCOUNTER — Ambulatory Visit: Payer: Medicare Other | Admitting: Urology

## 2019-10-06 NOTE — Progress Notes (Signed)
Patient: [name]  Service Category: E/M  Provider: Gaspar Cola, MD  DOB: Jan 09, 1956  DOS: 10/07/2019  Location: Office  MRN: [id number]  Setting: Ambulatory outpatient  Referring Provider: [name], NP  Type: Established Patient  Specialty: Interventional Pain Management  PCP: [name], NP  Location: Remote location  Delivery: TeleHealth     Virtual Encounter - Pain Management PROVIDER NOTE: Information contained herein reflects review and annotations entered in association with encounter. Interpretation of such information and data should be left to medically-trained personnel. Information provided to patient can be located elsewhere in the medical record under "Patient Instructions". Document created using STT-dictation technology, any transcriptional errors that may result from process are unintentional.    Contact & Pharmacy Preferred: [phone] Home: [phone] (home) Mobile: There is no such number on file (mobile). E-mail: No e-mail address on record  [place], [place] [address] Phone: [phone] Fax: [phone]   Pre-screening  [name] offered "in-person" vs "virtual" encounter. She indicated preferring virtual for this encounter.   Reason COVID-19*  Social distancing based on CDC and AMA recommendations.   I contacted [name] on 10/07/2019 via telephone.      I clearly identified myself as Gaspar Cola, MD. I verified that I was speaking with the correct person using two identifiers (Name: [name], and date of birth: 1956-01-28).  Consent I sought verbal advanced consent from [name] for virtual visit interactions. I informed [name] of possible security and privacy concerns, risks, and limitations associated with providing "not-in-person" medical evaluation and management services. I also informed [name] of the availability of  "in-person" appointments. Finally, I informed her that there would be a charge for the virtual visit and that she could be  personally, fully or partially, financially responsible for it. [name] expressed understanding and agreed to proceed.   Historic Elements   [name] is a 64 y.o. year old, female patient evaluated today after her last encounter by our practice on 10/06/2019. [name]  has a past medical history of Acute encephalopathy (05/22/2018), Allergy, Anxiety, Ascites, C. difficile colitis (07/10/2015), Cancer ([place]), Cirrhosis of liver not due to alcohol ([place]) (2016), Degenerative disk disease, Diverticulitis, Gastroparesis, GERD (gastroesophageal reflux disease), History of hiatal hernia, Hypertension, Hypothyroid, Hypothyroidism due to amiodarone, Ileus ([place]) (08/01/2015), Intussusception intestine ([place]) (05/2015), Orthostatic hypotension, PAF (paroxysmal atrial fibrillation) ([place]) (03/2015), Pancreatitis, Pneumonia (11/14/2015), Right ureteral stone (07/14/2016), Sepsis ([place]) (12/15/2017), Sick sinus syndrome ([place]), Stomach ulcer, Stroke ([place]), Syncope (01/2015), Syncope due to orthostatic hypotension (05/18/2015), Tachyarrhythmia (01/10/2016), TIA (transient ischemic attack) (02/2015), Type 1 diabetes ([place]), UTERINE CANCER, HX OF (03/27/2007), and UTI (urinary tract infection) (05/22/2018). She also  has a past surgical history that includes Hernia repair; Abdominal hysterectomy; Cholecystectomy; Esophagogastroduodenoscopy (N/A, 04/04/2015); Cardiac catheterization (N/A, 01/12/2016); Esophagogastroduodenoscopy (egd) with propofol (N/A, 01/18/2016); Flexible sigmoidoscopy (N/A, 01/18/2016); Cystoscopy/ureteroscopy/holmium laser (Right, 07/14/2016); and Esophagogastroduodenoscopy (N/A, 12/28/2017). [name] has a current medication list which includes the following prescription(s): albuterol, atorvastatin, cholecalciferol, ciprofloxacin, insulin detemir, magnesium gluconate, metoprolol tartrate, midodrine,  nitroglycerin, ondansetron, [START ON 10/11/2019] oxycodone, [START ON 11/10/2019] oxycodone, [START ON 12/10/2019] oxycodone, sertraline, sucralfate, and [START ON 10/11/2019] cyclobenzaprine. She  reports that she has quit smoking. Her smoking use included cigarettes. She has never used smokeless tobacco. She reports that she does not drink alcohol or use drugs. [name] is allergic to aspirin; codeine sulfate;  erythromycin; prednisone; rosiglitazone maleate; and tetanus-diphtheria toxoids td.   HPI  Today, she is being contacted for medication management.  The patient indicates doing well with the current medication regimen. No adverse reactions or side effects reported to the medications.  According to the patient the medication allows her to control her pain, doing more, and be able to be more productive.  Today I took the opportunity to talk to her about her recent hospitalization with the pyelonephritis and I pointed out that she has a decrease renal function.  I explained to her in detail how this would affect her pain medication and I have recommended that she go through a  "Drug Holiday". I took this opportunity to talk to the patient about the concept of tolerance and how "Drug Holidays" help control it.  I went over the slow taper to avoid withdrawals and how to stay off of the medication for 14 consecutive days to eliminate that tolerance.  Today I recommended that she start decreasing the amount of oxycodone that she takes by 1 daily pill every 7 days until she can accomplish a 14-day "Drug Holiday".  She understood and accepted and she indicated that she would go ahead with it.  On the patient's next appointment we will review if she has done this appropriately.  She was also asked to consider going back to only a 4 times daily regimen instead of a every 4 hour regimen when she again resumes the use of the oxycodone.  She understood and accepted.  Pharmacotherapy Assessment  Analgesic: Oxycodone IR  5 mg, 1 tab PO q 4 hrs (30 mg/day of oxycodone) MME/day: 45 mg/day.   Monitoring: Pharmacotherapy: No side-effects or adverse reactions reported. [place] PMP: PDMP reviewed during this encounter.       Compliance: No problems identified. Effectiveness: Clinically acceptable. Plan: Refer to "POC".  UDS:  Summary  Date Value Ref Range Status  11/25/2018 FINAL  Final    Comment:    ==================================================================== TOXASSURE COMP DRUG ANALYSIS,UR ==================================================================== Test                             Result       Flag       Units Drug Present and Declared for Prescription Verification   Acetaminophen                  PRESENT      EXPECTED Drug Present not Declared for Prescription Verification   Alcohol, Ethyl                 0.297        UNEXPECTED g/dL    Sources of ethyl alcohol include alcoholic beverages or as a    fermentation product of glucose; glucose is present in this    specimen.  Interpret result with caution, as the presence of    ethyl alcohol is likely due, at least in part, to fermentation of    glucose.   Cyclobenzaprine                PRESENT      UNEXPECTED   Desmethylcyclobenzaprine       PRESENT      UNEXPECTED    Desmethylcyclobenzaprine is an expected metabolite of    cyclobenzaprine.   Salicylate                     PRESENT  UNEXPECTED   Diphenhydramine                PRESENT      UNEXPECTED Drug Absent but Declared for Prescription Verification   Ibuprofen                      Not Detected UNEXPECTED    Ibuprofen, as indicated in the declared medication list, is not    always detected even when used as directed.   Metoprolol                     Not Detected UNEXPECTED ==================================================================== Test                      Result    Flag   Units      Ref Range   Creatinine              114              mg/dL       >=20 ==================================================================== Declared Medications:  The flagging and interpretation on this report are based on the  following declared medications.  Unexpected results may arise from  inaccuracies in the declared medications.  **Note: The testing scope of this panel includes these medications:  Metoprolol  **Note: The testing scope of this panel does not include small to  moderate amounts of these reported medications:  Acetaminophen  Ibuprofen  **Note: The testing scope of this panel does not include following  reported medications:  Apixaban  Atorvastatin  Insulin (Lantus) ==================================================================== For clinical consultation, please call [phone]. ====================================================================    Laboratory Chemistry Profile (12 mo)  Renal: 10/03/2019: BUN 13; Creatinine, Ser 1.01  Lab Results  Component Value Date   GFR 42.53 (L) 08/18/2019   GFRAA >60 10/03/2019   GFRNONAA 58 (L) 10/03/2019   Hepatic: 09/30/2019: Albumin 3.5 Lab Results  Component Value Date   AST 34 09/30/2019   ALT 28 09/30/2019   Other: 11/25/2018: CRP 10.8; Sed Rate 79; Vit D, 25-Hydroxy 4.7; Vitamin B-12 546  Note: Above Lab results reviewed.  Imaging  CT Abdomen Pelvis W Contrast CLINICAL DATA:  Diffuse abdominal pain.  EXAM: CT ABDOMEN AND PELVIS WITH CONTRAST  TECHNIQUE: Multidetector CT imaging of the abdomen and pelvis was performed using the standard protocol following bolus administration of intravenous contrast.  CONTRAST:  8m OMNIPAQUE IOHEXOL 300 MG/ML  SOLN  COMPARISON:  05/16/2019  FINDINGS: Lower chest: The lung bases are clear. The heart size is normal.  Hepatobiliary: The liver is cirrhotic. Normal gallbladder.There is mild extrahepatic biliary ductal dilatation, likely representing a reservoir fact. This is stable from prior study.  Pancreas: Normal  contours without ductal dilatation. No peripancreatic fluid collection.  Spleen: The spleen is enlarged.  Adrenals/Urinary Tract:  --Adrenal glands: No adrenal hemorrhage.  --Right kidney/ureter: The right kidney is atrophic when compared to the right. There is diffuse wall thickening throughout the right ureter with mild right-sided hydroureteronephrosis.  --Left kidney/ureter: There is a punctate nonobstructing stone in the interpolar region of the left kidney.  --Urinary bladder: There is asymmetric bladder wall thickening, especially on the left.  Stomach/Bowel:  --Stomach/Duodenum: No hiatal hernia or other gastric abnormality. Normal duodenal course and caliber.  --Small bowel: No dilatation or inflammation.  --Colon: There is a moderate amount of stool in the colon.  --Appendix: Normal.  Vascular/Lymphatic: Atherosclerotic calcification is present within the non-aneurysmal abdominal aorta, without hemodynamically significant  stenosis. Esophageal varices are noted. Portosystemic shunting is noted.  --No retroperitoneal lymphadenopathy.  --No mesenteric lymphadenopathy.  --No pelvic or inguinal lymphadenopathy.  Reproductive: Status post hysterectomy. No adnexal mass.  Other: No ascites or free air. There is a trace amount of free fluid in the patient's abdomen.  Musculoskeletal. No acute displaced fractures.  IMPRESSION: 1. Diffuse wall thickening of the right ureter with mild right-sided hydroureteronephrosis. Findings may represent an ascending urinary tract infection in the appropriate clinical setting. 2. Asymmetric bladder wall thickening. Correlation with urinalysis is recommended. 3. Cirrhosis with sequela of portal hypertension as detailed above. 4. Moderate stool burden.  Aortic Atherosclerosis (ICD10-I70.0).  Electronically Signed   By: Constance Holster M.D.   On: 09/30/2019 19:25 DG Chest Portable 1 View CLINICAL DATA:  Leg and low  back pain  EXAM: PORTABLE CHEST 1 VIEW  COMPARISON:  08/26/2019  FINDINGS: The heart size and mediastinal contours are within normal limits. Aortic atherosclerosis. Both lungs are clear. The visualized skeletal structures are unremarkable.  IMPRESSION: No active disease.  Electronically Signed   By: Donavan Foil M.D.   On: 09/30/2019 18:42   Assessment  The primary encounter diagnosis was Chronic pain syndrome. Diagnoses of Chronic low back pain (Primary area of Pain) (Bilateral) (L>R) w/ sciatica (Bilateral), Chronic lower extremity pain (Secondary Area of Pain) (Bilateral) (L>R), Chronic musculoskeletal pain, Myofascial pain syndrome, and Fibromyalgia syndrome were also pertinent to this visit.  Plan of Care  Problem-specific:  No problem-specific Assessment & Plan notes found for this encounter.  I am having [name] start on oxyCODONE and oxyCODONE. I am also having her maintain her albuterol, nitroGLYCERIN, metoprolol tartrate, ondansetron, atorvastatin, sucralfate, cholecalciferol, magnesium gluconate, ciprofloxacin, midodrine, sertraline, Insulin Detemir, cyclobenzaprine, and oxyCODONE.  Pharmacotherapy (Medications Ordered): Meds ordered this encounter  Medications  . cyclobenzaprine (FLEXERIL) 10 MG tablet    Sig: Take 1 tablet (10 mg total) by mouth 3 (three) times daily as needed for muscle spasms. Must last 30 days.    Dispense:  90 tablet    Refill:  5    Fill one day early if pharmacy is closed on scheduled refill date. May substitute for generic if available.  Marland Kitchen oxyCODONE (OXY IR/ROXICODONE) 5 MG immediate release tablet    Sig: Take 1 tablet (5 mg total) by mouth every 4 (four) hours as needed for severe pain. Must last 30 days. Max: 6/day    Dispense:  180 tablet    Refill:  0    Chronic Pain: STOP Act (Not applicable) Fill 1 day early if closed on refill date. Do not fill until: 10/11/2019. To last until: 11/10/2019. Avoid benzodiazepines within 8  hours of opioids  . oxyCODONE (OXY IR/ROXICODONE) 5 MG immediate release tablet    Sig: Take 1 tablet (5 mg total) by mouth every 4 (four) hours as needed for severe pain. Must last 30 days. Max: 6/day    Dispense:  180 tablet    Refill:  0    Chronic Pain: STOP Act (Not applicable) Fill 1 day early if closed on refill date. Do not fill until: 11/10/2019. To last until: 12/10/2019. Avoid benzodiazepines within 8 hours of opioids  . oxyCODONE (OXY IR/ROXICODONE) 5 MG immediate release tablet    Sig: Take 1 tablet (5 mg total) by mouth every 4 (four) hours as needed for severe pain. Must last 30 days. Max: 6/day    Dispense:  180 tablet    Refill:  0    Chronic Pain:  STOP Act (Not applicable) Fill 1 day early if closed on refill date. Do not fill until: 12/10/2019. To last until: 01/09/2020. Avoid benzodiazepines within 8 hours of opioids   Orders:  No orders of the defined types were placed in this encounter.  Follow-up plan:   Return in about 13 weeks (around 01/06/2020) for (VV), (MM).      Interventional management options:  Considering:   Diagnostic bilateral lumbar facet block  Possible bilateral lumbar facet RFA  Diagnostic bilateral cervical facet block  Possible bilateral cervical facet RFA    PRN Procedures:   None at this time    Recent Visits No visits were found meeting these conditions.  Showing recent visits within past 90 days and meeting all other requirements   Today's Visits Date Type Provider Dept  10/07/19 Telemedicine Milinda Pointer, MD [facility]  Showing today's visits and meeting all other requirements   Future Appointments No visits were found meeting these conditions.  Showing future appointments within next 90 days and meeting all other requirements   I discussed the assessment and treatment plan with the patient. The patient was provided an opportunity to ask questions and all were answered. The patient agreed with the plan and demonstrated an  understanding of the instructions.  Patient advised to call back or seek an in-person evaluation if the symptoms or condition worsens.  Duration of encounter: 18 minutes.  Note by: Gaspar Cola, MD Date: 10/07/2019; Time: 12:12 PM

## 2019-10-06 NOTE — Telephone Encounter (Signed)
Left message for patient to call us back to go over pre virtual appointment questions.

## 2019-10-07 ENCOUNTER — Ambulatory Visit: Payer: Medicare Other | Attending: Pain Medicine | Admitting: Pain Medicine

## 2019-10-07 ENCOUNTER — Encounter: Payer: Self-pay | Admitting: Pain Medicine

## 2019-10-07 ENCOUNTER — Telehealth: Payer: Self-pay | Admitting: Primary Care

## 2019-10-07 ENCOUNTER — Other Ambulatory Visit: Payer: Self-pay

## 2019-10-07 DIAGNOSIS — M5441 Lumbago with sciatica, right side: Secondary | ICD-10-CM | POA: Diagnosis not present

## 2019-10-07 DIAGNOSIS — M7918 Myalgia, other site: Secondary | ICD-10-CM

## 2019-10-07 DIAGNOSIS — G8929 Other chronic pain: Secondary | ICD-10-CM

## 2019-10-07 DIAGNOSIS — M797 Fibromyalgia: Secondary | ICD-10-CM

## 2019-10-07 DIAGNOSIS — M79604 Pain in right leg: Secondary | ICD-10-CM

## 2019-10-07 DIAGNOSIS — M5442 Lumbago with sciatica, left side: Secondary | ICD-10-CM

## 2019-10-07 DIAGNOSIS — G894 Chronic pain syndrome: Secondary | ICD-10-CM

## 2019-10-07 DIAGNOSIS — M79605 Pain in left leg: Secondary | ICD-10-CM | POA: Diagnosis not present

## 2019-10-07 MED ORDER — OXYCODONE HCL 5 MG PO TABS: 5.0000 mg | ORAL_TABLET | ORAL | 0 refills | Status: DC | PRN

## 2019-10-07 MED ORDER — CYCLOBENZAPRINE HCL 10 MG PO TABS: 10.0000 mg | ORAL_TABLET | Freq: Three times a day (TID) | ORAL | 5 refills | Status: DC | PRN

## 2019-10-07 NOTE — Telephone Encounter (Signed)
Stacey from [facility] called to request verbal orders. Requesting PT 2w4 and then 1w4. She is also requesting to add nursing to plan of care. She can be reached at [phone]

## 2019-10-07 NOTE — Telephone Encounter (Signed)
Approved.  

## 2019-10-07 NOTE — Patient Instructions (Signed)
____________________________________________________________________________________________  Drug Holidays (Slow)  What is a "Drug Holiday"? Drug Holiday: is the name given to the period of time during which a patient stops taking a medication(s) for the purpose of eliminating tolerance to the drug.  Benefits . Improved effectiveness of opioids. . Decreased opioid dose needed to achieve benefits. . Improved pain with lesser dose.  What is tolerance? Tolerance: is the progressive decreased in effectiveness of a drug due to its repetitive use. With repetitive use, the body gets use to the medication and as a consequence, it loses its effectiveness. This is a common problem seen with opioid pain medications. As a result, a larger dose of the drug is needed to achieve the same effect that used to be obtained with a smaller dose.  How long should a "Drug Holiday" last? You should stay off of the pain medicine for at least 14 consecutive days. (2 weeks)  Should I stop the medicine "cold [place]"? No. You should always coordinate with your Pain Specialist so that he/she can provide you with the correct medication dose to make the transition as smoothly as possible.  How do I stop the medicine? Slowly. You will be instructed to decrease the daily amount of pills that you take by one (1) pill every seven (7) days. This is called a "slow downward taper" of your dose. For example: if you normally take four (4) pills per day, you will be asked to drop this dose to three (3) pills per day for seven (7) days, then to two (2) pills per day for seven (7) days, then to one (1) per day for seven (7) days, and at the end of those last seven (7) days, this is when the "Drug Holiday" would start.   Will I have withdrawals? By doing a "slow downward taper" like this one, it is unlikely that you will experience any significant withdrawal symptoms. Typically, what triggers withdrawals is the sudden stop of a high  dose opioid therapy. Withdrawals can usually be avoided by slowly decreasing the dose over a prolonged period of time.  What are withdrawals? Withdrawals: refers to the wide range of symptoms that occur after stopping or dramatically reducing opiate drugs after heavy and prolonged use. Withdrawal symptoms do not occur to patients that use low dose opioids, or those who take the medication sporadically. Contrary to benzodiazepine (example: Valium, Xanax, etc.) or alcohol withdrawals ("Delirium Tremens"), opioid withdrawals are not lethal. Withdrawals are the physical manifestation of the body getting rid of the excess receptors.  Expected Symptoms Early symptoms of withdrawal may include: . Agitation . Anxiety . Muscle aches . Increased tearing . Insomnia . Runny nose . Sweating . Yawning  Late symptoms of withdrawal may include: . Abdominal cramping . Diarrhea . Dilated pupils . Goose bumps . Nausea . Vomiting  Will I experience withdrawals? Due to the slow nature of the taper, it is very unlikely that you will experience any.  What is a slow taper? Taper: refers to the gradual decrease in dose.  ___________________________________________________________________________________________

## 2019-10-08 NOTE — Telephone Encounter (Signed)
Gave the approval to [facility].

## 2019-10-09 ENCOUNTER — Other Ambulatory Visit: Payer: Self-pay | Admitting: Primary Care

## 2019-10-09 MED ORDER — ACCU-CHEK AVIVA PLUS VI STRP: ORAL_STRIP | 2 refills | Status: DC

## 2019-10-16 ENCOUNTER — Telehealth: Payer: Self-pay

## 2019-10-16 NOTE — Telephone Encounter (Signed)
Samantha PT with [facility], l/m stating patient told her today that she fell 2 days ago in her home. Patient does not know what happened and how she fell. She states she passed out and then woke up and crawled to the couch. No further detail was given on voicemail.  Also [name] states patient needs a hospital follow up with [name] and that patient is aware but has not called to schedule. Sending to [name] and [place] if needed is [phone]

## 2019-10-16 NOTE — Telephone Encounter (Signed)
Unfortunately she's had recurrent falls despite home PT and treatment. I'm happy to see her for hospital follow up and labs when she's ready to schedule.

## 2019-10-19 ENCOUNTER — Telehealth: Payer: Self-pay | Admitting: *Deleted

## 2019-10-19 NOTE — Telephone Encounter (Signed)
Patient made an appointment on 10/20/2019

## 2019-10-19 NOTE — Telephone Encounter (Signed)
Noted, approved. Will evaluate patient tomorrow.

## 2019-10-19 NOTE — Telephone Encounter (Signed)
Ria Comment nurse with [facility] left a voicemail stating that she is needing verbal orders for skilled nursing for twice a week for two weeks and once a week for 3 weeks. Mendel Ryder stated that patient is coming in to the office tomorrow morning at 10:00 am for an appointment. Ria Comment stated that patient's blood pressure today was 174/98 and heart rate of 110. Ria Comment stated that the patient is still feeling dizzy with movement and when standing up.

## 2019-10-20 ENCOUNTER — Other Ambulatory Visit: Payer: Self-pay

## 2019-10-20 ENCOUNTER — Encounter: Payer: Self-pay | Admitting: Primary Care

## 2019-10-20 ENCOUNTER — Ambulatory Visit (INDEPENDENT_AMBULATORY_CARE_PROVIDER_SITE_OTHER): Payer: Medicare Other | Admitting: Primary Care

## 2019-10-20 VITALS — BP 148/92 | HR 105 | Temp 96.9°F | Ht 63.0 in | Wt 161.0 lb

## 2019-10-20 DIAGNOSIS — I10 Essential (primary) hypertension: Secondary | ICD-10-CM

## 2019-10-20 DIAGNOSIS — R296 Repeated falls: Secondary | ICD-10-CM

## 2019-10-20 DIAGNOSIS — R29898 Other symptoms and signs involving the musculoskeletal system: Secondary | ICD-10-CM

## 2019-10-20 DIAGNOSIS — N10 Acute tubulo-interstitial nephritis: Secondary | ICD-10-CM

## 2019-10-20 DIAGNOSIS — E1165 Type 2 diabetes mellitus with hyperglycemia: Secondary | ICD-10-CM

## 2019-10-20 DIAGNOSIS — F411 Generalized anxiety disorder: Secondary | ICD-10-CM

## 2019-10-20 MED ORDER — SERTRALINE HCL 50 MG PO TABS: 50.0000 mg | ORAL_TABLET | Freq: Every day | ORAL | 1 refills | Status: DC

## 2019-10-20 NOTE — Assessment & Plan Note (Signed)
Continued. Working with [facility] PT. Walking with cane today.

## 2019-10-20 NOTE — Assessment & Plan Note (Signed)
History of labile readings, compliant to current regimen. BP above goal today but I am reticent to change her therapy given labile readings.  I advised she follow back up with her cardiologist which was recommended in November 2020.

## 2019-10-20 NOTE — Patient Instructions (Signed)
Schedule a follow up visit with your heart doctor, Dr. Rockey Situ.  Schedule a follow up visit with your Urologist.  We've increased the dose of your sertraline (Zoloft) to 50 mg. I sent a new prescription to your pharmacy.  We've increased the dose of your Levemir to 34 units in the morning. Continue 30 units in the evening.   Continue checking your blood sugar levels.  Appropriate times to check your blood sugar levels are:  -Before any meal (breakfast, lunch, dinner) -Two hours after any meal (breakfast, lunch, dinner) -Bedtime  Record your readings and notify me if you continue to consistently run at or above 200.  Please schedule a follow up appointment in 6 weeks for diabetes check.  It was a pleasure to see you today!

## 2019-10-20 NOTE — Progress Notes (Signed)
Subjective:    Patient ID: [name], female    DOB: 1956/05/27, 64 y.o.   MRN: [id number]  HPI  This visit occurred during the SARS-CoV-2 public health emergency.  Safety protocols were in place, including screening questions prior to the visit, additional usage of staff PPE, and extensive cleaning of exam room while observing appropriate contact time as indicated for disinfecting solutions.   [name] is a 64 year old female with a significant medical history including atrial fibrillation, hypertension, CVA, uncontrolled diabetes, CAD, cirrhosis, GI bleed, chronic low back pain, chronic pain syndrome who presents today for hospital follow up.  She presented to [facility] ED on 09/30/19 with complaints of back pain and diarrhea. Symptoms of watery bowel movements for 10 days, increased back and flank pain, weakness.   During her stay in the ED she underwent CT which showed thickening of ureteral wall without obstruction, consistent for pyelonephritis. She did have her ureteral stent removed in late December 2020 after 3 years. She was admitted for further treatment.  During her hospital stay she was treated with IV fluids and antibiotics for four days. She was discharged home on 10/03/19 with prescriptions for Cipro. It was recommended she follow up with her Urologist and PCP.    Since her discharge home she has not yet followed up with her Urologist, she does plan on calling for an appointment. She is visited by home health nursing once weekly. She is also active in home health PT and has been visited once, due for a visit tomorrow.   She continues to fall several times weekly, loses her balance. She is checking her blood sugars twice daily:  AM fasting: high 100's Afternoon: low 200's.  Lowest reading: 47  She is injecting Levemir 30 units twice daily. She doesn't eat much during the day. She does have meals on wheels who deliver five meals weekly. She eats one decent meal daily,  sometimes snacks during the day.  She is not checking her BP at home.   She is also feeling "stressed to the max" and "can't handle it". Symptoms include shortness of breath, feeling anxious/nervous. Her son-in-law, his girlfriend, and three year old grandson are living with them in her trailer who have outstayed their welcome. This has caused a significant amount of stress. She is currently managed on Zoloft 25 mg, overall did well in the past but doesn't feel effective now.   BP Readings from Last 3 Encounters:  10/20/19 (!) 148/92  10/03/19 (!) 154/81  08/26/19 (!) 178/85     Review of Systems  Respiratory: Negative for shortness of breath.   Cardiovascular: Negative for chest pain.  Musculoskeletal: Positive for back pain.  Psychiatric/Behavioral: The patient is nervous/anxious.        See HPI       Past Medical History:  Diagnosis Date  . Acute encephalopathy 05/22/2018  . Allergy   . Anxiety   . Ascites   . C. difficile colitis 07/10/2015  . Cancer (HCC)    HX OF CANCER OF UTERUS   . Cirrhosis of liver not due to alcohol ([place]) 2016  . Degenerative disk disease   . Diverticulitis   . Gastroparesis   . GERD (gastroesophageal reflux disease)   . History of hiatal hernia   . Hypertension   . Hypothyroid   . Hypothyroidism due to amiodarone   . Ileus ([place]) 08/01/2015  . Intussusception intestine ([place]) 05/2015  . Orthostatic hypotension   . PAF (paroxysmal  atrial fibrillation) ([place]) 03/2015   a. new onset 03/2015 in setting of intractable N/V; b. on Eliquis 5 mg bid; c. CHADSVASc 4 (DM, TIA x 2, female)  . Pancreatitis   . Pneumonia 11/14/2015  . Right ureteral stone 07/14/2016  . Sepsis ([place]) 12/15/2017  . Sick sinus syndrome ([place])   . Stomach ulcer   . Stroke [facility])    with minimal left sided weakness  . Syncope 01/2015  . Syncope due to orthostatic hypotension 05/18/2015  . Tachyarrhythmia 01/10/2016  . TIA (transient ischemic attack) 02/2015  . Type 1 diabetes ([place])     on levemir  . UTERINE CANCER, HX OF 03/27/2007   Qualifier: Diagnosis of  By: Maxie Better FNP, Rosalita Levan   . UTI (urinary tract infection) 05/22/2018     Social History   Socioeconomic History  . Marital status: Married    Spouse name: [name]   . Number of children: Not on file  . Years of education: Not on file  . Highest education level: Not on file  Occupational History  . Occupation: Disabled 2nd back problems  Tobacco Use  . Smoking status: Former Smoker    Types: Cigarettes  . Smokeless tobacco: Never Used  . Tobacco comment: 25 years ago and only smoked occasionally  Substance and Sexual Activity  . Alcohol use: No  . Drug use: No  . Sexual activity: Not Currently  Other Topics Concern  . Not on file  Social History Narrative   Lives in [place], [place] with her husband and 2 sons.   Social Determinants of Health   Financial Resource Strain: Low Risk   . Difficulty of Paying Living Expenses: Not hard at all  Food Insecurity: No Food Insecurity  . Worried About Charity fundraiser in the Last Year: Never true  . Ran Out of Food in the Last Year: Never true  Transportation Needs: No Transportation Needs  . Lack of Transportation (Medical): No  . Lack of Transportation (Non-Medical): No  Physical Activity: Inactive  . Days of Exercise per Week: 0 days  . Minutes of Exercise per Session: 0 min  Stress: No Stress Concern Present  . Feeling of Stress : Not at all  Social Connections: Unknown  . Frequency of Communication with Friends and Family: Patient refused  . Frequency of Social Gatherings with Friends and Family: Patient refused  . Attends Religious Services: Patient refused  . Active Member of Clubs or Organizations: Patient refused  . Attends Archivist Meetings: Patient refused  . Marital Status: Patient refused  Intimate Partner Violence: Unknown  . Fear of Current or Ex-Partner: Patient refused  . Emotionally Abused: Patient refused  .  Physically Abused: Patient refused  . Sexually Abused: Patient refused    Past Surgical History:  Procedure Laterality Date  . ABDOMINAL HYSTERECTOMY    . CARDIAC CATHETERIZATION N/A 01/12/2016   Procedure: Left Heart Cath and Coronary Angiography;  Surgeon: Wellington Hampshire, MD;  Location: [place] CV LAB;  Service: Cardiovascular;  Laterality: N/A;  . CHOLECYSTECTOMY    . CYSTOSCOPY/URETEROSCOPY/HOLMIUM LASER Right 07/14/2016   Procedure: CYSTOSCOPY/URETEROSCOPY/HOLMIUM LASER;  Surgeon: Alexis Frock, MD;  Location: [facility] ORS;  Service: Urology;  Laterality: Right;  . ESOPHAGOGASTRODUODENOSCOPY N/A 04/04/2015   Procedure: ESOPHAGOGASTRODUODENOSCOPY (EGD);  Surgeon: Hulen Luster, MD;  Location: [facility] ENDOSCOPY;  Service: Endoscopy;  Laterality: N/A;  . ESOPHAGOGASTRODUODENOSCOPY N/A 12/28/2017   Procedure: ESOPHAGOGASTRODUODENOSCOPY (EGD);  Surgeon: Lin Landsman, MD;  Location: [facility] ENDOSCOPY;  Service: Gastroenterology;  Laterality: N/A;  . ESOPHAGOGASTRODUODENOSCOPY (EGD) WITH PROPOFOL N/A 01/18/2016   Procedure: ESOPHAGOGASTRODUODENOSCOPY (EGD) WITH PROPOFOL;  Surgeon: Lucilla Lame, MD;  Location: [facility] ENDOSCOPY;  Service: Endoscopy;  Laterality: N/A;  . FLEXIBLE SIGMOIDOSCOPY N/A 01/18/2016   Procedure: FLEXIBLE SIGMOIDOSCOPY;  Surgeon: Lucilla Lame, MD;  Location: [facility] ENDOSCOPY;  Service: Endoscopy;  Laterality: N/A;  . HERNIA REPAIR      Family History  Problem Relation Age of Onset  . Hypertension Mother   . CAD Sister   . Heart attack Sister        Deceased 2014/11/26  . CAD Brother     Allergies  Allergen Reactions  . Aspirin Rash  . Codeine Sulfate Rash  . Erythromycin Rash    Reaction:  Fever   . Prednisone Swelling  . Rosiglitazone Maleate Swelling  . Tetanus-Diphtheria Toxoids Td Rash and Other (See Comments)    Reaction:  Fever     Current Outpatient Medications on File Prior to Visit  Medication Sig Dispense Refill  . albuterol (PROVENTIL HFA;VENTOLIN HFA)  108 (90 Base) MCG/ACT inhaler Inhale 2 puffs into the lungs every 6 (six) hours as needed for wheezing or shortness of breath. 1 Inhaler 2  . atorvastatin (LIPITOR) 40 MG tablet Take 40 mg by mouth daily.    . cholecalciferol (VITAMIN D3) 25 MCG (1000 UNIT) tablet Take 1,000 Units by mouth daily.    . cyclobenzaprine (FLEXERIL) 10 MG tablet Take 1 tablet (10 mg total) by mouth 3 (three) times daily as needed for muscle spasms. Must last 30 days. 90 tablet 5  . glucose blood (ACCU-CHEK AVIVA PLUS) test strip Use as instructed to test blood sugar up to 4 times daily 300 each 2  . Insulin Detemir (LEVEMIR) 100 UNIT/ML Pen Inject 30 Units into the skin 2 (two) times daily.    . magnesium gluconate (MAGONATE) 500 MG tablet Take 500 mg by mouth 2 (two) times daily.    . metoprolol tartrate (LOPRESSOR) 25 MG tablet Take 1 tablet (25 mg total) by mouth 2 (two) times daily. 180 tablet 3  . nitroGLYCERIN (NITROSTAT) 0.3 MG SL tablet Place 1 tablet (0.3 mg total) under the tongue every 5 (five) minutes as needed for chest pain. 100 tablet 0  . ondansetron (ZOFRAN ODT) 4 MG disintegrating tablet Take 1 tablet (4 mg total) by mouth every 8 (eight) hours as needed for nausea or vomiting. 20 tablet 0  . oxyCODONE (OXY IR/ROXICODONE) 5 MG immediate release tablet Take 1 tablet (5 mg total) by mouth every 4 (four) hours as needed for severe pain. Must last 30 days. Max: 6/day 180 tablet 0  . [START ON 11/10/2019] oxyCODONE (OXY IR/ROXICODONE) 5 MG immediate release tablet Take 1 tablet (5 mg total) by mouth every 4 (four) hours as needed for severe pain. Must last 30 days. Max: 6/day 180 tablet 0  . [START ON 12/10/2019] oxyCODONE (OXY IR/ROXICODONE) 5 MG immediate release tablet Take 1 tablet (5 mg total) by mouth every 4 (four) hours as needed for severe pain. Must last 30 days. Max: 6/day 180 tablet 0  . sucralfate (CARAFATE) 1 g tablet Take 1 g by mouth 4 (four) times daily -  with meals and at bedtime.    .  [DISCONTINUED] sertraline (ZOLOFT) 25 MG tablet Take 1 tablet (25 mg total) by mouth at bedtime.    . midodrine (PROAMATINE) 5 MG tablet Hold this medication until outpatient PCP followup because your blood pressure has been elevated during  hospitalization. (Patient not taking: Reported on 10/20/2019) 270 tablet 3   No current facility-administered medications on file prior to visit.    BP (!) 148/92   Pulse (!) 105   Temp (!) 96.9 F (36.1 C) (Temporal)   Ht 5' 3"  (1.6 m)   Wt 161 lb (73 kg)   SpO2 98%   BMI 28.52 kg/m    Objective:   Physical Exam  Constitutional: She is oriented to person, place, and time. She appears well-nourished. She does not appear ill.  Cardiovascular: Regular rhythm.  Sinus tachycardia  Respiratory: Effort normal and breath sounds normal.  Musculoskeletal:     Cervical back: Neck supple.  Neurological: She is alert and oriented to person, place, and time.  Skin: Skin is warm and dry.  Psychiatric: She has a normal mood and affect.           Assessment & Plan:

## 2019-10-20 NOTE — Assessment & Plan Note (Signed)
Hospital admission, treated with IV antibiotics and hydration. Completed outpatient antibiotics.  Recommended she re-connect with her Urologist as recommended.  Hospital notes, labs, imaging reviewed. She appears well today.

## 2019-10-20 NOTE — Assessment & Plan Note (Signed)
Increased which is likely secondary to her current home situation. Increase Zoloft to 50 mg. Follow up in 6 weeks.

## 2019-10-20 NOTE — Assessment & Plan Note (Signed)
Continued, chronic. Working with [facility] PT.

## 2019-10-20 NOTE — Assessment & Plan Note (Signed)
Glucose readings above goal, more so in the afternoon.  Increase Levemir to 34 units in the AM, continue 30 units in the PM.  She will continue to monitor readings. Follow up in 6 weeks.

## 2019-10-22 ENCOUNTER — Telehealth: Payer: Self-pay | Admitting: Primary Care

## 2019-10-22 NOTE — Telephone Encounter (Signed)
Mendel Ryder nurse with [facility] and left v/m requesting cb about call 2 days ago.

## 2019-10-22 NOTE — Telephone Encounter (Signed)
Patient is requesting a call back. She stated that recently she has been falling a lot. She wanted to give more information to you that could be passed along to [place]. Patient is not sure if this is something [name] could help her with or does she need to see a specialist like a neurologist . Patient stated she is taking several medications and wanted to know if this could play a role in her falling

## 2019-10-23 ENCOUNTER — Telehealth: Payer: Self-pay

## 2019-10-23 NOTE — Telephone Encounter (Signed)
Pt said that [name] NP is aware of pts back pain and pts wants to get a back brace to see if that would help her back pain. Pt said she does not talk to pain mgt until end of month and wanted to see if [name] could order a back brace for pt; pt does not have a durable medical equipment store in mind to get the brace. Pt request cb after [name] reviews note.

## 2019-10-23 NOTE — Telephone Encounter (Signed)
Medications that could cause falls include her Oxycodone, cyclobenzaprine. She is supposed to follow back up with her cardiologist regarding blood pressure. This could be contributing to falls. I told her to make an appointment when I saw her earlier this week, she was due for follow up back in November/December 2020.

## 2019-10-23 NOTE — Telephone Encounter (Signed)
Please notify patient that I think that's a great idea but I wouldn't know which one to order. I recommend she call her pain management doctor.

## 2019-10-23 NOTE — Telephone Encounter (Signed)
Gave the approval for the verbal orders 

## 2019-10-23 NOTE — Telephone Encounter (Signed)
Spoken and notified patient of [name]'s comments. Patient verbalized understanding.  

## 2019-10-26 ENCOUNTER — Ambulatory Visit: Payer: Medicaid Other | Admitting: Family

## 2019-10-26 NOTE — Telephone Encounter (Signed)
Spoken and notified patient of [name]'s comments. Patient verbalized understanding.  

## 2019-10-27 ENCOUNTER — Telehealth: Payer: Self-pay

## 2019-10-27 ENCOUNTER — Ambulatory Visit: Payer: Medicare Other | Admitting: Urology

## 2019-10-27 NOTE — Telephone Encounter (Signed)
Samantha PT with [facility] left v/m that pt had fallen 4 times last week with no injuries. Samantha request verbal orders to make up one missed PT appt last wk. Pt kept cancelling [facility] PT appts last wk. Please advise.

## 2019-10-27 NOTE — Telephone Encounter (Signed)
Noted, approved. Has she been evaluated by social work? If not can we get someone in her home to assess the state of her living conditions? She's been working with PT for quite some time and still falling.

## 2019-10-28 NOTE — Telephone Encounter (Signed)
Spoken to [facility] gave the approval for the verbal orders. Also she is going to see patient today so will check with patient regarding this and check with home office.

## 2019-10-28 NOTE — Telephone Encounter (Signed)
Noted  

## 2019-11-02 ENCOUNTER — Ambulatory Visit: Payer: Medicaid Other | Admitting: Family

## 2019-11-02 NOTE — Progress Notes (Deleted)
Office Visit    Patient Name: GOLDA ZAVALZA Date of Encounter: 11/02/2019  Primary Care Provider:  [name], NP Primary Cardiologist:  [name], MD Electrophysiologist:  None   Chief Complaint    NEVE BRANSCOMB is a 64 y.o. female with a hx of diabetes, cirrhosis secondary to etoh, PAF on anticoagulation, orthostatic hypotension on midodrine, syncope presents today for ***   Past Medical History    Past Medical History:  Diagnosis Date  . Acute encephalopathy 05/22/2018  . Allergy   . Anxiety   . Ascites   . C. difficile colitis 07/10/2015  . Cancer (HCC)    HX OF CANCER OF UTERUS   . Cirrhosis of liver not due to alcohol ([place]) 2016  . Degenerative disk disease   . Diverticulitis   . Gastroparesis   . GERD (gastroesophageal reflux disease)   . History of hiatal hernia   . Hypertension   . Hypothyroid   . Hypothyroidism due to amiodarone   . Ileus ([place]) 08/01/2015  . Intussusception intestine ([place]) 05/2015  . Orthostatic hypotension   . PAF (paroxysmal atrial fibrillation) ([place]) 03/2015   a. new onset 03/2015 in setting of intractable N/V; b. on Eliquis 5 mg bid; c. CHADSVASc 4 (DM, TIA x 2, female)  . Pancreatitis   . Pneumonia 11/14/2015  . Right ureteral stone 07/14/2016  . Sepsis ([place]) 12/15/2017  . Sick sinus syndrome ([place])   . Stomach ulcer   . Stroke [facility])    with minimal left sided weakness  . Syncope 01/2015  . Syncope due to orthostatic hypotension 05/18/2015  . Tachyarrhythmia 01/10/2016  . TIA (transient ischemic attack) 02/2015  . Type 1 diabetes ([place])    on levemir  . UTERINE CANCER, HX OF 03/27/2007   Qualifier: Diagnosis of  By: Maxie Better FNP, Rosalita Levan   . UTI (urinary tract infection) 05/22/2018   Past Surgical History:  Procedure Laterality Date  . ABDOMINAL HYSTERECTOMY    . CARDIAC CATHETERIZATION N/A 01/12/2016   Procedure: Left Heart Cath and Coronary Angiography;  Surgeon: Wellington Hampshire, MD;  Location: [place] CV  LAB;  Service: Cardiovascular;  Laterality: N/A;  . CHOLECYSTECTOMY    . CYSTOSCOPY/URETEROSCOPY/HOLMIUM LASER Right 07/14/2016   Procedure: CYSTOSCOPY/URETEROSCOPY/HOLMIUM LASER;  Surgeon: Alexis Frock, MD;  Location: [facility] ORS;  Service: Urology;  Laterality: Right;  . ESOPHAGOGASTRODUODENOSCOPY N/A 04/04/2015   Procedure: ESOPHAGOGASTRODUODENOSCOPY (EGD);  Surgeon: Hulen Luster, MD;  Location: [facility] ENDOSCOPY;  Service: Endoscopy;  Laterality: N/A;  . ESOPHAGOGASTRODUODENOSCOPY N/A 12/28/2017   Procedure: ESOPHAGOGASTRODUODENOSCOPY (EGD);  Surgeon: Lin Landsman, MD;  Location: [facility] ENDOSCOPY;  Service: Gastroenterology;  Laterality: N/A;  . ESOPHAGOGASTRODUODENOSCOPY (EGD) WITH PROPOFOL N/A 01/18/2016   Procedure: ESOPHAGOGASTRODUODENOSCOPY (EGD) WITH PROPOFOL;  Surgeon: Lucilla Lame, MD;  Location: [facility] ENDOSCOPY;  Service: Endoscopy;  Laterality: N/A;  . FLEXIBLE SIGMOIDOSCOPY N/A 01/18/2016   Procedure: FLEXIBLE SIGMOIDOSCOPY;  Surgeon: Lucilla Lame, MD;  Location: [facility] ENDOSCOPY;  Service: Endoscopy;  Laterality: N/A;  . HERNIA REPAIR      Allergies  Allergies  Allergen Reactions  . Aspirin Rash  . Codeine Sulfate Rash  . Erythromycin Rash    Reaction:  Fever   . Prednisone Swelling  . Rosiglitazone Maleate Swelling  . Tetanus-Diphtheria Toxoids Td Rash and Other (See Comments)    Reaction:  Fever     History of Present Illness    DENEAN PAVON is a 64 y.o. female with a hx of DM2, cirrhosis secondary  to etoh, PAF on anticoagulation, orthostatic hypotension on midodrine, syncope, CVA, GI blee, frequent falls, anxiety. She was last seen by Dr. Rockey Situ 07/29/19.  Atrial fibrillation noted on EKG 04/05/15. Prior cardiac cath 01/2016 with no significant coronary disease. April 2019 EF 55-60% and mild-moderate MR. She has had paracentesis in the past.  Seen 08/2019 for chest pain which was atypical and determined to be musculoskeletal. Per Dr. Donivan Scull review her CT scan shows no  significant coronary calcifications.   [facility] ED 09/30/19 with back pain and diarrhea. ET showed thickening of uretal wall consistent with pyelonephritis. Treated with IVF and abx. Hx of uretral stent which was removed 08/2019 after 3 years.   ***  EKGs/Labs/Other Studies Reviewed:   The following studies were reviewed today: ***  EKG:  EKG is ordered today.  The ekg ordered today demonstrates ***  Recent Labs: 05/16/2019: TSH 3.399 09/30/2019: ALT 28 10/03/2019: BUN 13; Creatinine, Ser 1.01; Hemoglobin 9.9; Magnesium 1.7; Platelets 144; Potassium 3.4; Sodium 137  Recent Lipid Panel    Component Value Date/Time   CHOL 123 08/18/2019 1017   CHOL 182 11/12/2011 0325   TRIG 145.0 08/18/2019 1017   TRIG 149 11/12/2011 0325   HDL 35.40 (L) 08/18/2019 1017   HDL 23 (L) 11/12/2011 0325   CHOLHDL 3 08/18/2019 1017   VLDL 29.0 08/18/2019 1017   VLDL 30 11/12/2011 0325   LDLCALC 58 08/18/2019 1017   LDLCALC 129 (H) 11/12/2011 0325    Home Medications   No outpatient medications have been marked as taking for the 11/02/19 encounter (Appointment) with Loel Dubonnet, NP.      Review of Systems    ***   ROS All other systems reviewed and are otherwise negative except as noted above.  Physical Exam    VS:  There were no vitals taken for this visit. , BMI There is no height or weight on file to calculate BMI. GEN: Well nourished, well developed, in no acute distress. HEENT: normal. Neck: Supple, no JVD, carotid bruits, or masses. Cardiac: ***RRR, no murmurs, rubs, or gallops. No clubbing, cyanosis, edema.  ***Radials/DP/PT 2+ and equal bilaterally.  Respiratory:  ***Respirations regular and unlabored, clear to auscultation bilaterally. GI: Soft, nontender, nondistended, BS + x 4. MS: No deformity or atrophy. Skin: Warm and dry, no rash. Neuro:  Strength and sensation are intact. Psych: Normal affect.  Accessory Clinical Findings    ECG personally reviewed by me today - *** -  no acute changes.  Assessment & Plan    1. PAF - Noted in setting of colitis with septic shock.  2. Orthostatic hypotension/syncope -  3. Sinus tachycardia -   Disposition: Follow up {follow up:15908} with ***   Loel Dubonnet, NP 11/02/2019, 10:28 AM

## 2019-11-03 ENCOUNTER — Ambulatory Visit: Payer: Medicaid Other | Admitting: Urology

## 2019-11-04 ENCOUNTER — Other Ambulatory Visit (INDEPENDENT_AMBULATORY_CARE_PROVIDER_SITE_OTHER): Payer: Medicare Other

## 2019-11-04 ENCOUNTER — Encounter: Payer: Self-pay | Admitting: Family

## 2019-11-04 ENCOUNTER — Other Ambulatory Visit: Payer: Self-pay

## 2019-11-04 DIAGNOSIS — R319 Hematuria, unspecified: Secondary | ICD-10-CM

## 2019-11-04 LAB — POC URINALSYSI DIPSTICK (AUTOMATED)
Bilirubin, UA: NEGATIVE
Glucose, UA: NEGATIVE
Ketones, UA: NEGATIVE
Nitrite, UA: NEGATIVE
Protein, UA: POSITIVE — AB
Spec Grav, UA: 1.02 (ref 1.010–1.025)
Urobilinogen, UA: 0.2 E.U./dL
pH, UA: 6 (ref 5.0–8.0)

## 2019-11-05 LAB — URINE CULTURE
MICRO NUMBER:: 10183712
SPECIMEN QUALITY:: ADEQUATE

## 2019-11-08 IMAGING — CR DG THORACIC SPINE 2V
2 series · 2 of 2 positions shown · non-contrast
Comparison: Chest radiograph, 07/09/2016

CLINICAL DATA: Multiple recent falls.  Pain.

EXAM:
THORACIC SPINE 2 VIEWS

[t-spine ap]
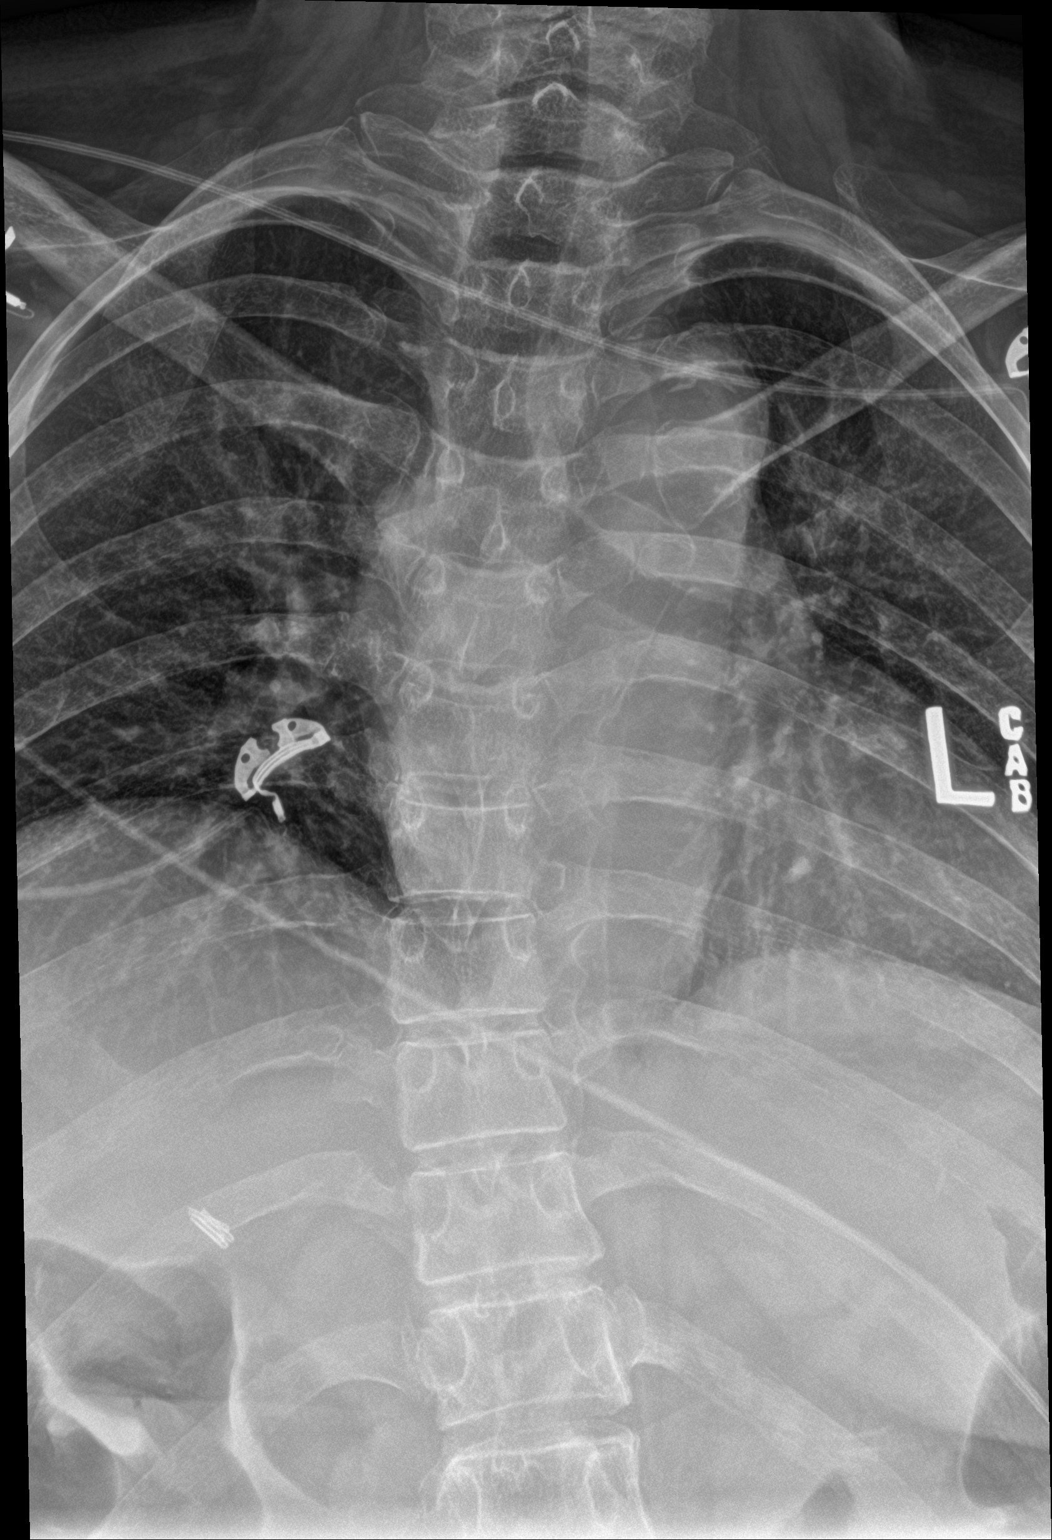

[t-spine lat]
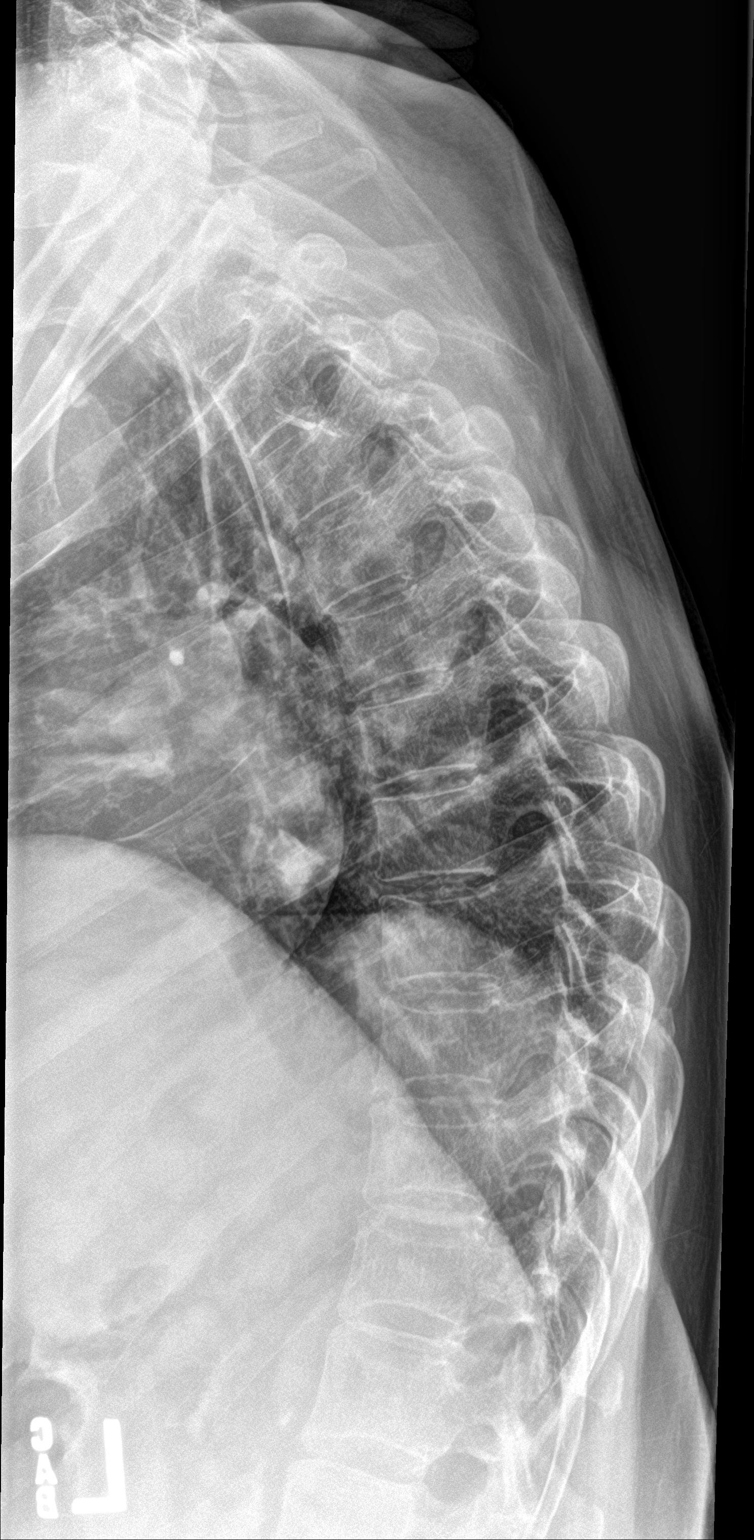

[2 of 2 positions shown; findings below may reference images not displayed]

FINDINGS: No fracture.  No spondylolisthesis.

Mild dextroscoliosis, apex at T8.

No bone lesion.  No degenerative changes.

Soft tissues are unremarkable.
IMPRESSION: 1. No fracture or acute finding.
2. Mild dextroscoliosis.  No other abnormality.

## 2019-11-08 IMAGING — CR DG LUMBAR SPINE 2-3V
2 series · 2 of 2 positions shown · non-contrast
Comparison: CT, 08/06/2016.

CLINICAL DATA: Multiple recent falls.  Pain.

EXAM:
LUMBAR SPINE - 2-3 VIEW

[l-spine ap]
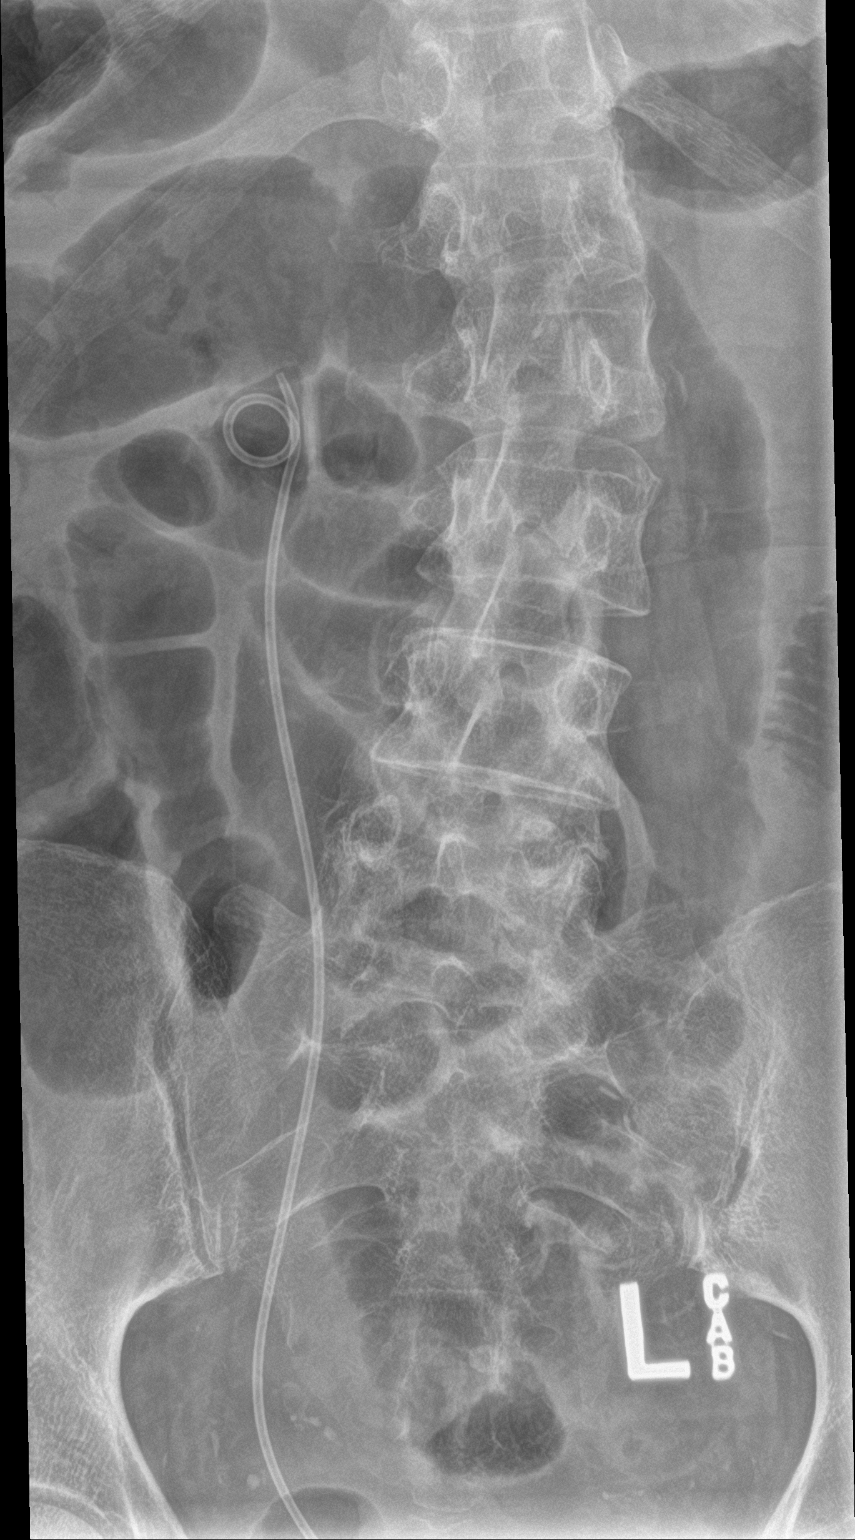

[l-spine lat]
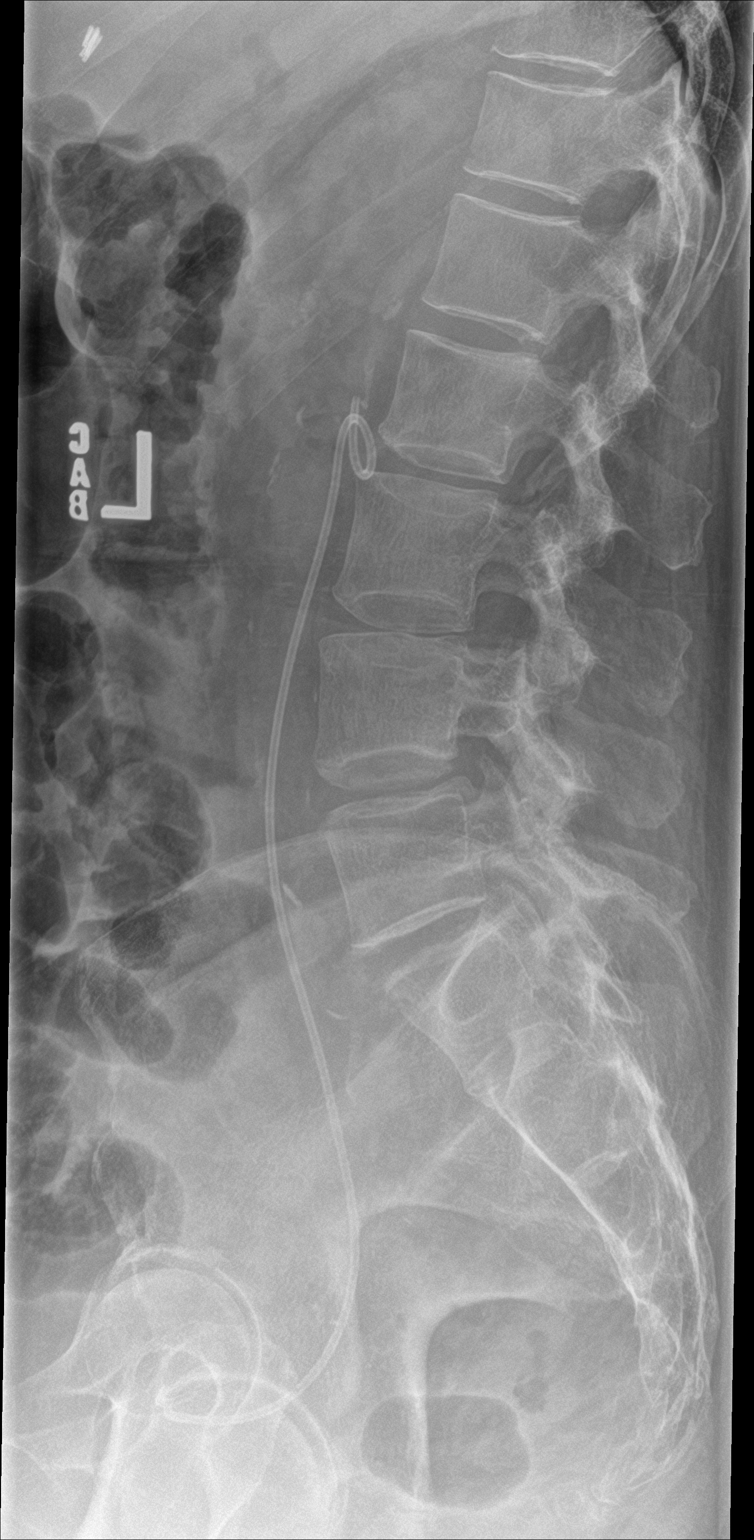

[2 of 2 positions shown; findings below may reference images not displayed]

FINDINGS: Mild levoscoliosis, apex at L2. No fracture. No spondylolisthesis.
Mild loss of disc height at L5-S1. No other degenerative change.

Scattered aortic vascular calcifications. Right ureteral stent
appears stable from the prior CT.
IMPRESSION: 1. No fracture or acute finding.
2. Levoscoliosis and mild L5-S1 disc degenerative changes.

## 2019-11-14 DIAGNOSIS — I480 Paroxysmal atrial fibrillation: Secondary | ICD-10-CM

## 2019-11-14 DIAGNOSIS — K219 Gastro-esophageal reflux disease without esophagitis: Secondary | ICD-10-CM

## 2019-11-14 DIAGNOSIS — I129 Hypertensive chronic kidney disease with stage 1 through stage 4 chronic kidney disease, or unspecified chronic kidney disease: Secondary | ICD-10-CM

## 2019-11-14 DIAGNOSIS — E1065 Type 1 diabetes mellitus with hyperglycemia: Secondary | ICD-10-CM

## 2019-11-14 DIAGNOSIS — S92902D Unspecified fracture of left foot, subsequent encounter for fracture with routine healing: Secondary | ICD-10-CM

## 2019-11-14 DIAGNOSIS — E1043 Type 1 diabetes mellitus with diabetic autonomic (poly)neuropathy: Secondary | ICD-10-CM

## 2019-11-14 DIAGNOSIS — N10 Acute tubulo-interstitial nephritis: Secondary | ICD-10-CM

## 2019-11-14 DIAGNOSIS — G8222 Paraplegia, incomplete: Secondary | ICD-10-CM

## 2019-11-14 DIAGNOSIS — N1832 Chronic kidney disease, stage 3b: Secondary | ICD-10-CM

## 2019-11-14 DIAGNOSIS — S92502D Displaced unspecified fracture of left lesser toe(s), subsequent encounter for fracture with routine healing: Secondary | ICD-10-CM

## 2019-11-14 DIAGNOSIS — K7460 Unspecified cirrhosis of liver: Secondary | ICD-10-CM

## 2019-11-14 DIAGNOSIS — I69354 Hemiplegia and hemiparesis following cerebral infarction affecting left non-dominant side: Secondary | ICD-10-CM

## 2019-11-14 DIAGNOSIS — I951 Orthostatic hypotension: Secondary | ICD-10-CM

## 2019-11-14 DIAGNOSIS — K5792 Diverticulitis of intestine, part unspecified, without perforation or abscess without bleeding: Secondary | ICD-10-CM

## 2019-11-14 DIAGNOSIS — E1022 Type 1 diabetes mellitus with diabetic chronic kidney disease: Secondary | ICD-10-CM

## 2019-11-14 DIAGNOSIS — E860 Dehydration: Secondary | ICD-10-CM

## 2019-11-14 DIAGNOSIS — G894 Chronic pain syndrome: Secondary | ICD-10-CM

## 2019-11-14 DIAGNOSIS — F329 Major depressive disorder, single episode, unspecified: Secondary | ICD-10-CM

## 2019-11-14 DIAGNOSIS — R188 Other ascites: Secondary | ICD-10-CM

## 2019-11-14 DIAGNOSIS — K5900 Constipation, unspecified: Secondary | ICD-10-CM

## 2019-11-14 DIAGNOSIS — I495 Sick sinus syndrome: Secondary | ICD-10-CM

## 2019-11-14 DIAGNOSIS — I2510 Atherosclerotic heart disease of native coronary artery without angina pectoris: Secondary | ICD-10-CM

## 2019-11-14 DIAGNOSIS — N179 Acute kidney failure, unspecified: Secondary | ICD-10-CM

## 2019-11-14 DIAGNOSIS — K3184 Gastroparesis: Secondary | ICD-10-CM

## 2019-11-14 DIAGNOSIS — F411 Generalized anxiety disorder: Secondary | ICD-10-CM

## 2019-11-18 ENCOUNTER — Telehealth: Payer: Self-pay

## 2019-11-18 NOTE — Telephone Encounter (Signed)
Approved.  

## 2019-11-18 NOTE — Telephone Encounter (Signed)
[name] PTA with [facility] left v/m requesting verbal orders for [facility] social worker. Pt told [name] today that pts husband and stepson has been taking pts Hydrocodone (per med list it could be oxycodone). Pt has been hiding the pain med to keep pts husband and stepson from getting it.

## 2019-11-19 NOTE — Telephone Encounter (Signed)
Gave the approval for the verbal orders 

## 2019-11-19 NOTE — Telephone Encounter (Signed)
Message left for [name] to return my call this morning

## 2019-11-21 IMAGING — DX DG CHEST 1V PORT
1 series · 1 of 1 positions shown · non-contrast
Comparison: 12/28/2017

CLINICAL DATA: Central chest pain

EXAM:
PORTABLE CHEST 1 VIEW

[chest ap]
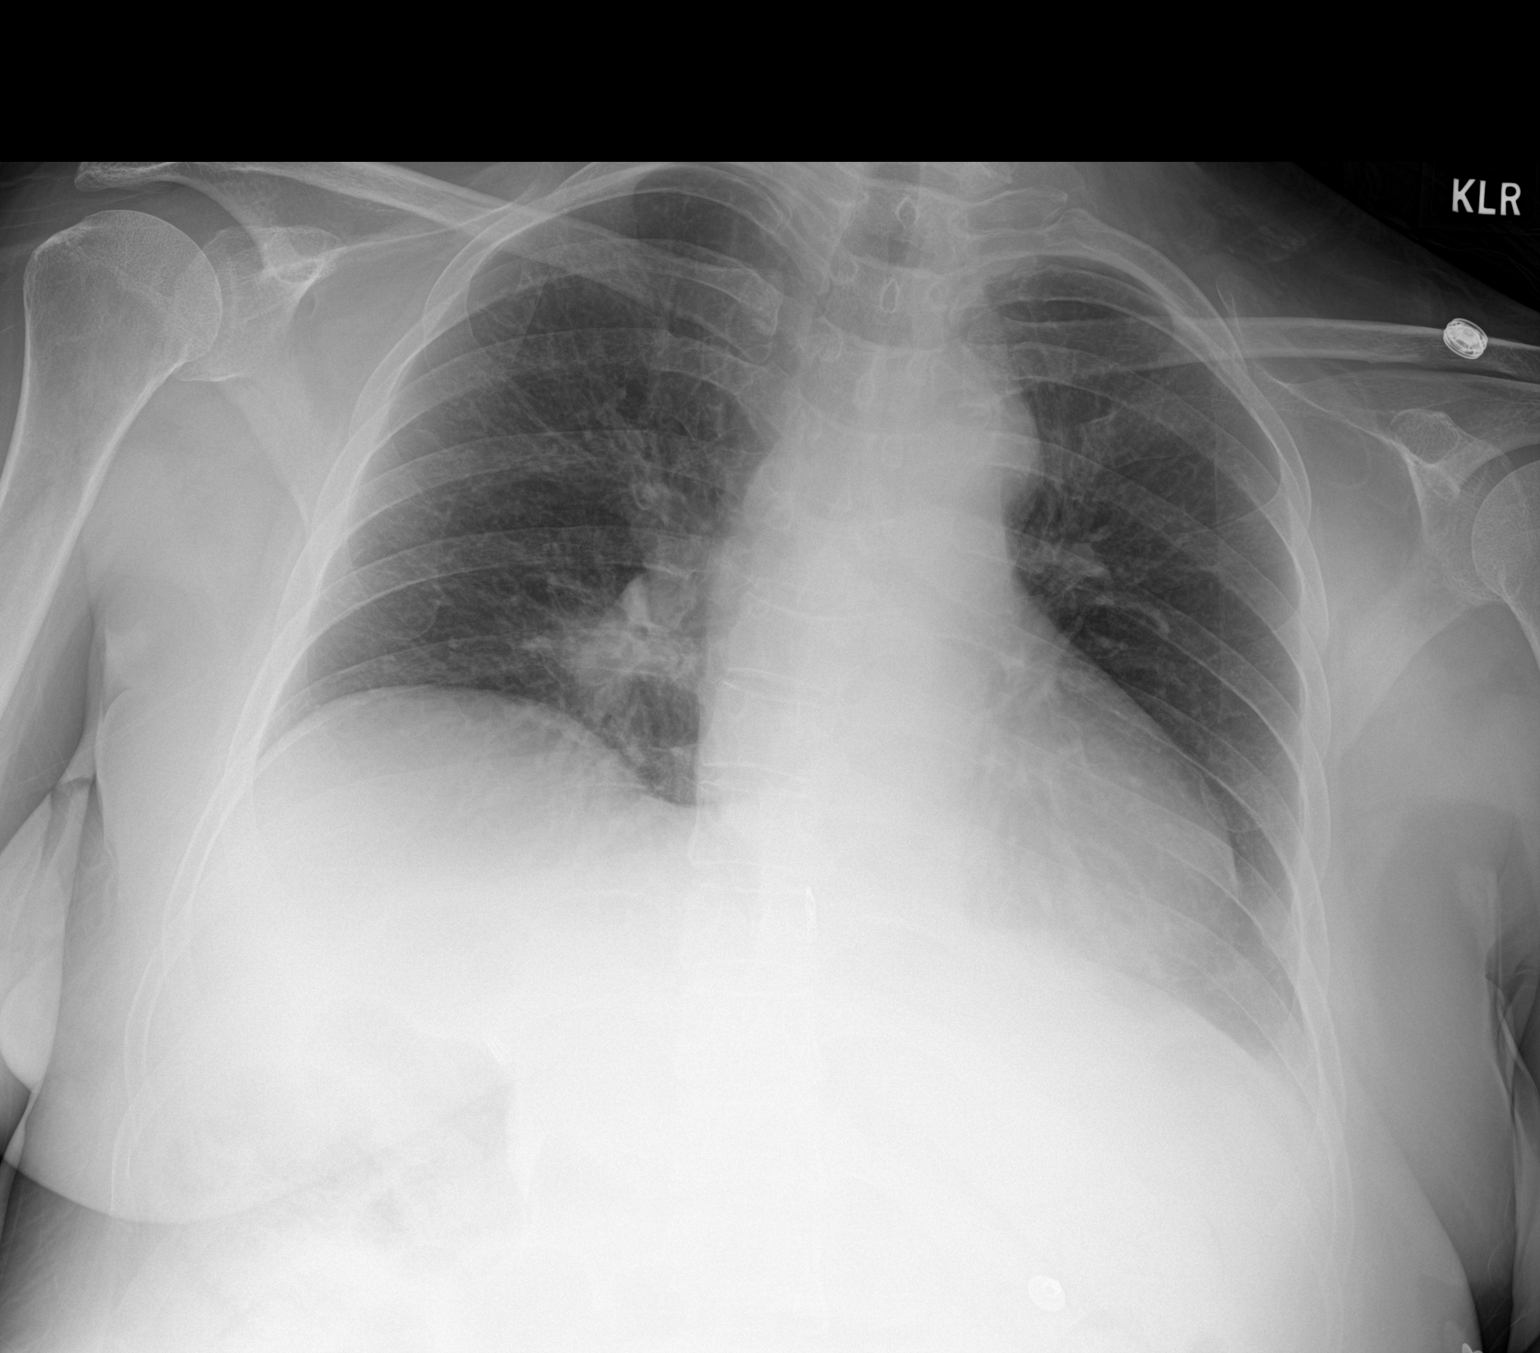

[1 of 1 positions shown; findings below may reference images not displayed]

FINDINGS: Low lung volumes. Left basilar atelectasis or infiltrate slightly
improved. Improving airspace opacity in the right lung base. No
visible effusions. Heart is normal size.
IMPRESSION: Improving bibasilar opacities. Residual left basilar atelectasis or
infiltrate.

## 2019-11-21 IMAGING — DX DG CHEST 1V PORT
1 series · 2 of 2 positions shown · non-contrast
Comparison: 12/14/2017

CLINICAL DATA: Encounter for esophogeal tear. S/P endoscopy.
take deep inspiration for imaging study. Best images obtainable.

EXAM:
PORTABLE CHEST - 1 VIEW

[Series 1: chest ap · 0.14mm/px · 2 of 2 slices shown]
[im 1/2]
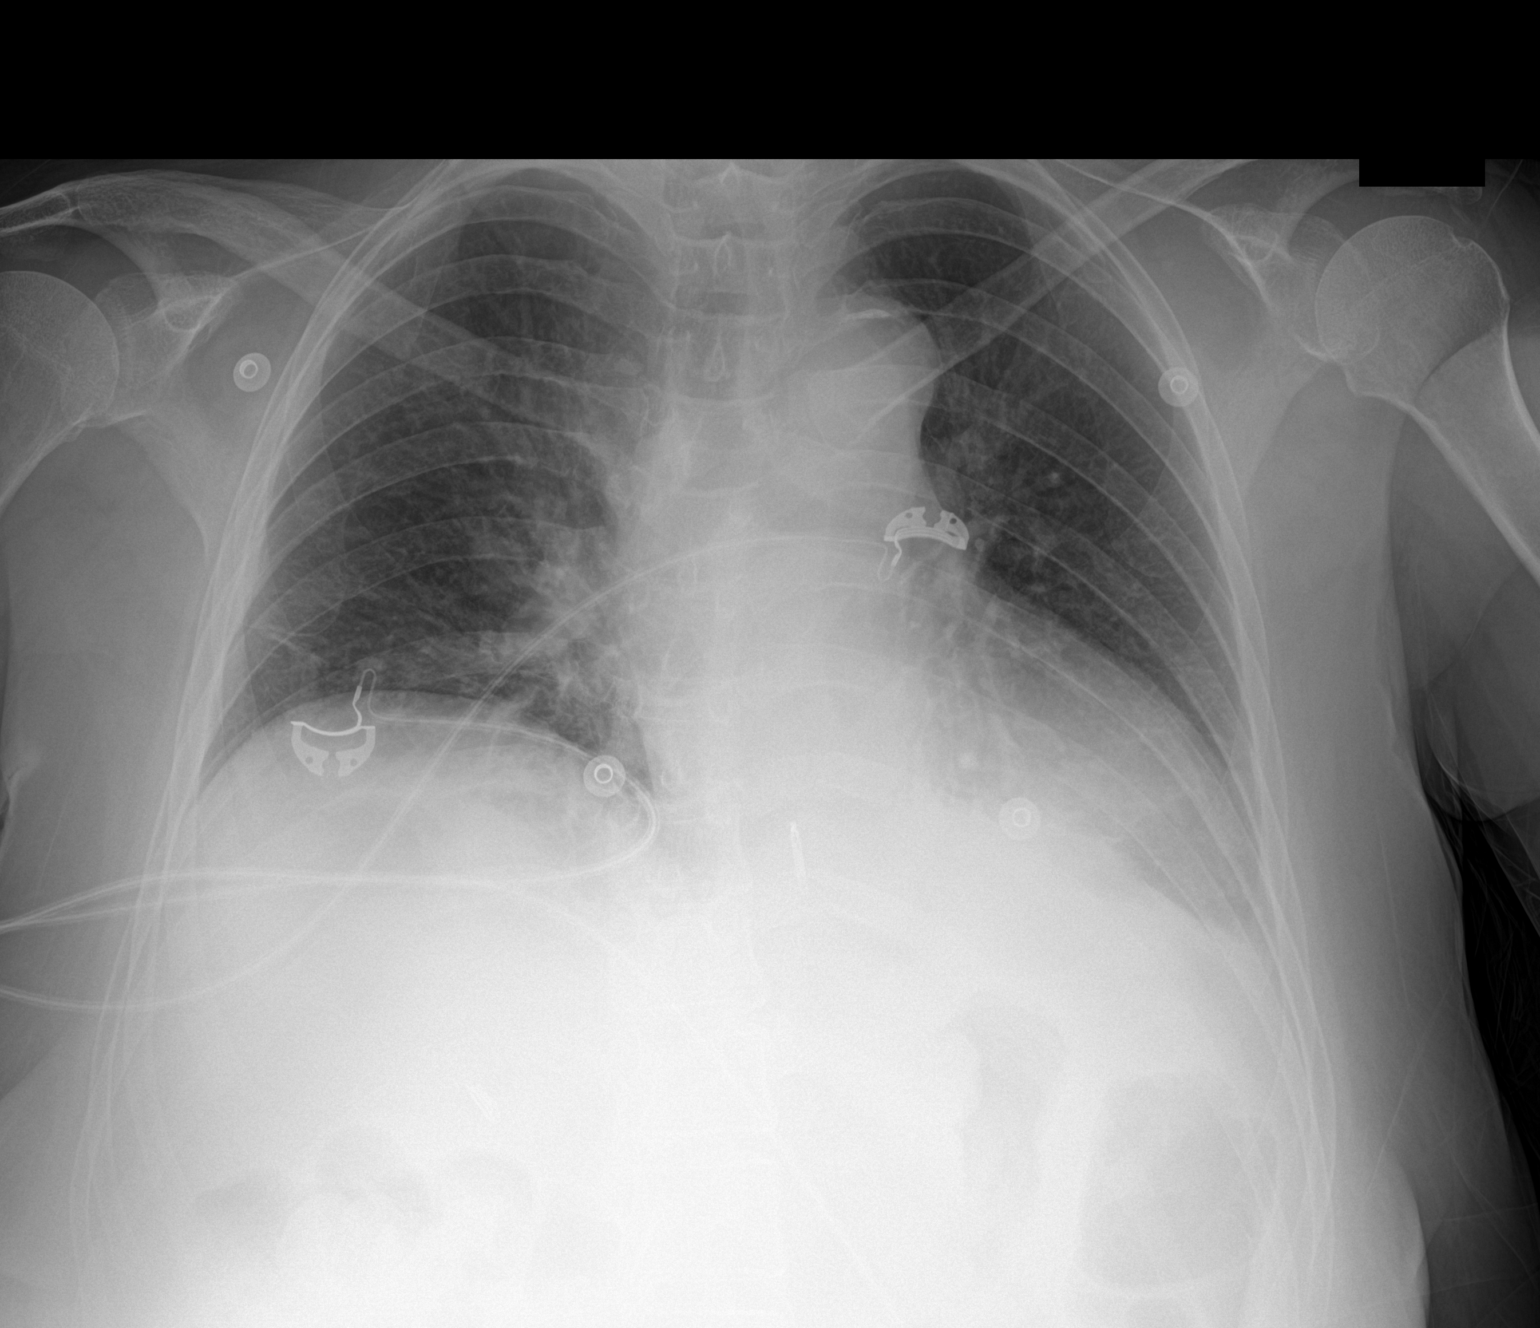
[im 2/2]
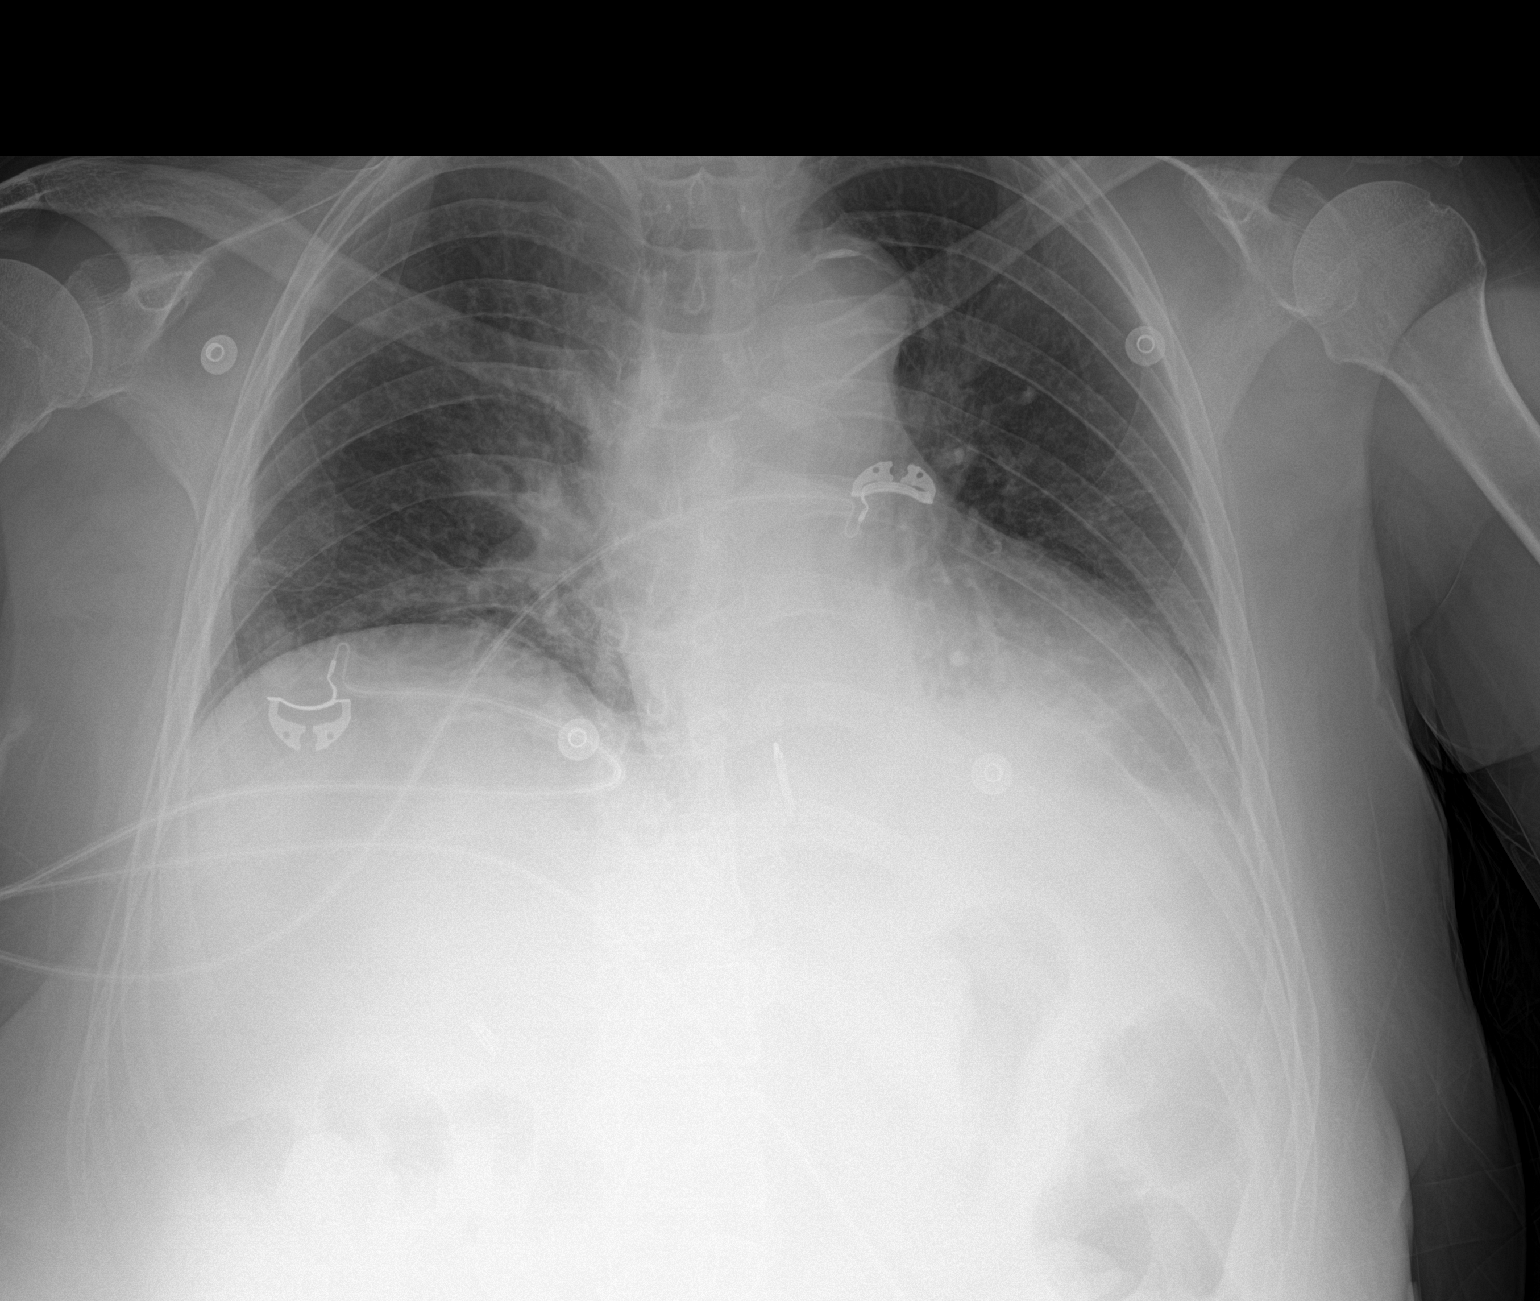

[2 of 2 positions shown; findings below may reference images not displayed]

FINDINGS: Relatively low lung volumes. Some increase in patchy subsegmental
atelectasis or consolidation in both lung bases.

Heart size upper limits normal. Aortic Atherosclerosis ([id number]-170.0)

No definite effusion.  No pneumothorax or pneumomediastinum evident.

Visualized bones unremarkable. Surgical clip projects over the
region of the distal esophagus.
IMPRESSION: 1. Low lung volumes with some increase in bibasilar
atelectasis/consolidation.

## 2019-11-22 IMAGING — MR MR MRA NECK W/ CM
11 of 14 series · 34 of 48 positions shown · IV contrast (multihance)
Comparison: Head CT 12/29/2017. Brain MRI 12/29/2015. Carotid
Doppler ultrasound 02/16/2015.

CLINICAL DATA: Left hemiparesis.

EXAM:
MR HEAD WITHOUT CONTRAST
MR CIRCLE OF WILLIS WITHOUT CONTRAST
MRA OF THE NECK WITHOUT AND WITH CONTRAST
TECHNIQUE: Multiplanar, multiecho pulse sequences of the brain, circle of
willis and surrounding structures were obtained without intravenous
contrast. Angiographic images of the neck were obtained using MRA
technique without and with intravenous contrast.
CONTRAST:  14mL MULTIHANCE GADOBENATE DIMEGLUMINE 529 MG/ML IV SOLN

[Series 2: T1 · sagittal · 5.0mm · 0.45mm/px · 2 of 23 slices shown (1 of 2)]
[im 1/23]
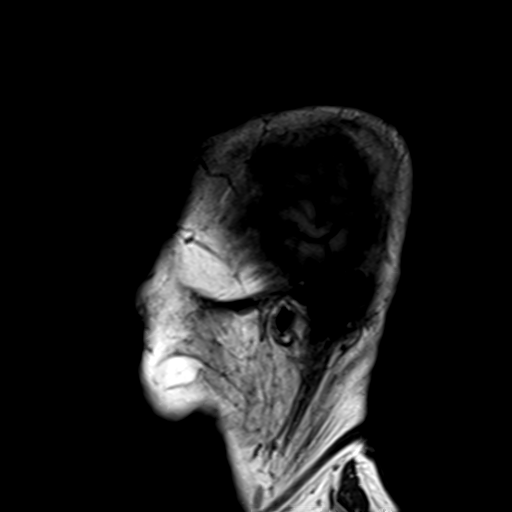
[im 23/23]
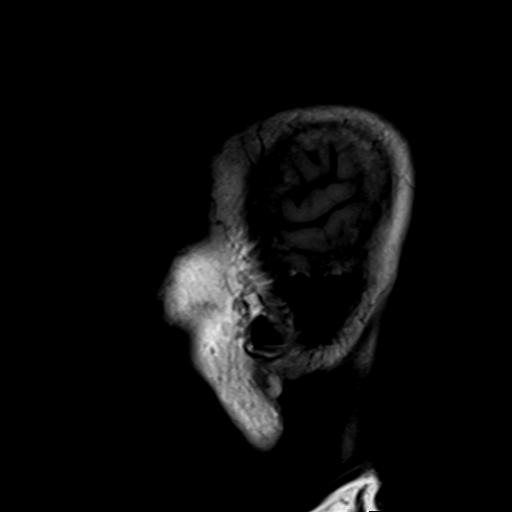

[Series 4: DWI · axial · 3.0mm · 1.80mm/px · z∈[-118,+43]mm · 4 of 51 slices shown (1 of 2)]
[im 1/51]
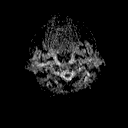
[im 17/51]
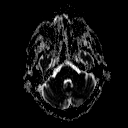
[im 34/51]
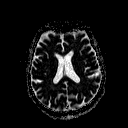
[im 51/51]
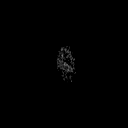

[Series 6: DWI · coronal · 3.0mm · 1.80mm/px · 2 of 45 slices shown (2 of 2)]
[im 1/45]
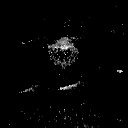
[im 45/45]
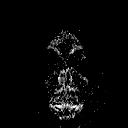

[Series 7: TOF · axial · non-contrast · 0.7mm · 0.35mm/px · z∈[-92,+6]mm · 8 of 143 slices shown]
[im 1/143]
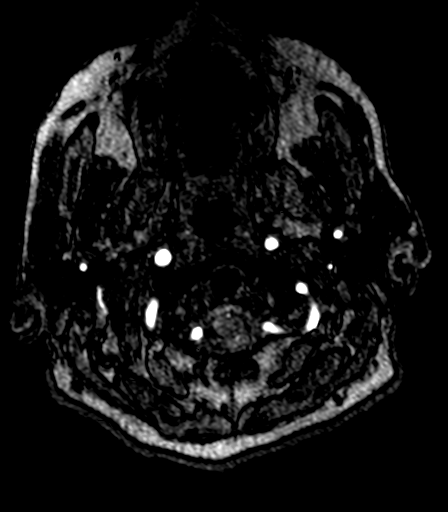
[im 21/143]
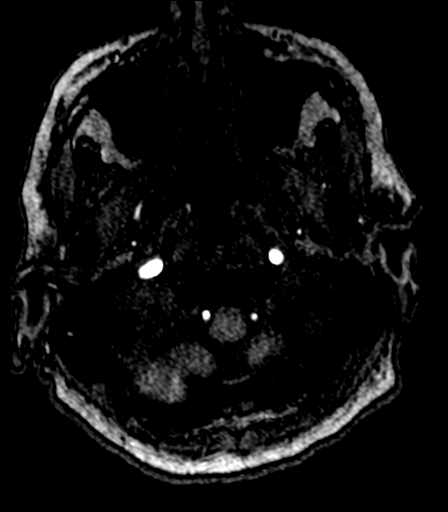
[im 41/143]
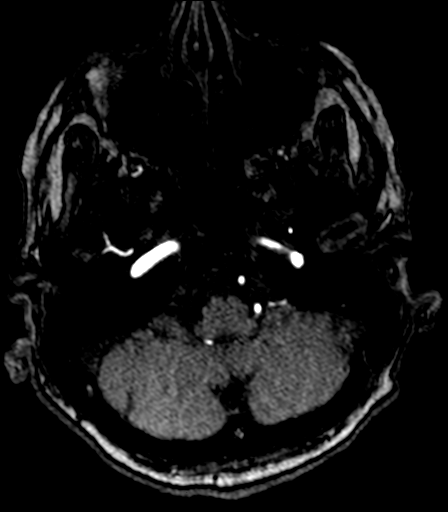
[im 61/143]
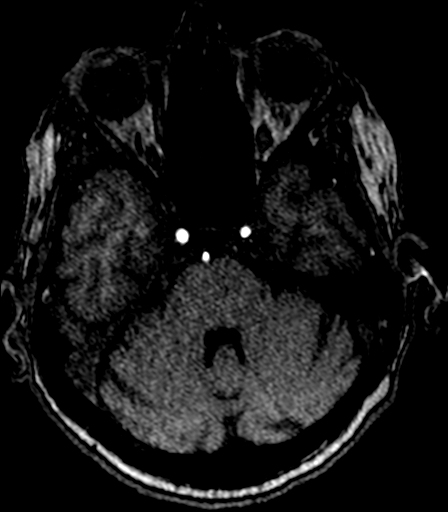
[im 82/143]
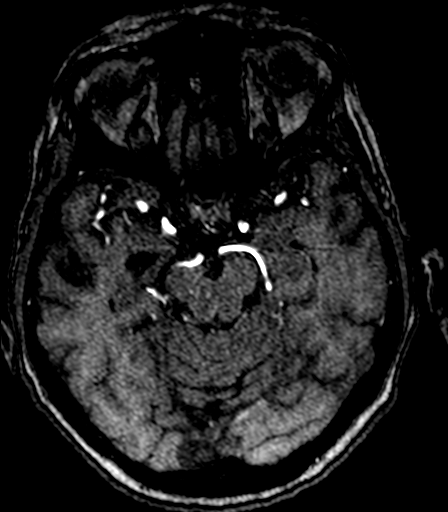
[im 102/143]
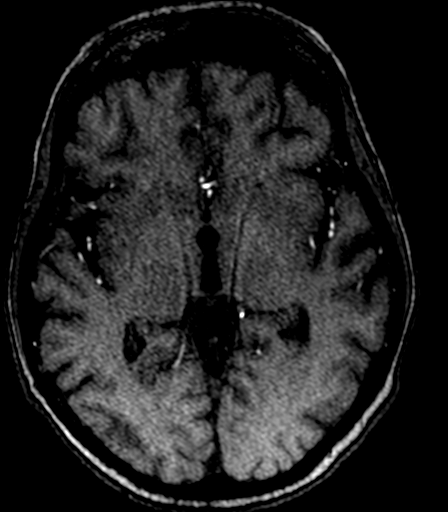
[im 122/143]
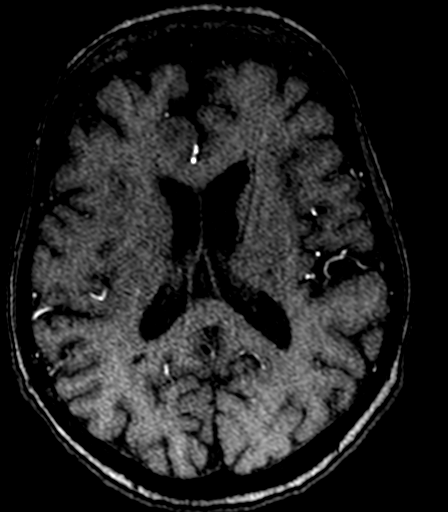
[im 143/143]
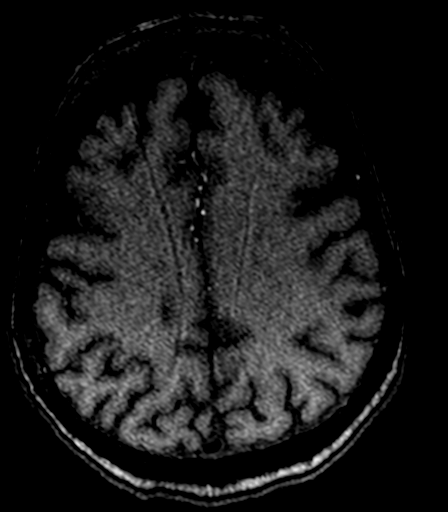

[Series 11: GRE · sagittal · 5.0mm · 0.45mm/px · 1 of 27 slices shown]
[im 1/27]
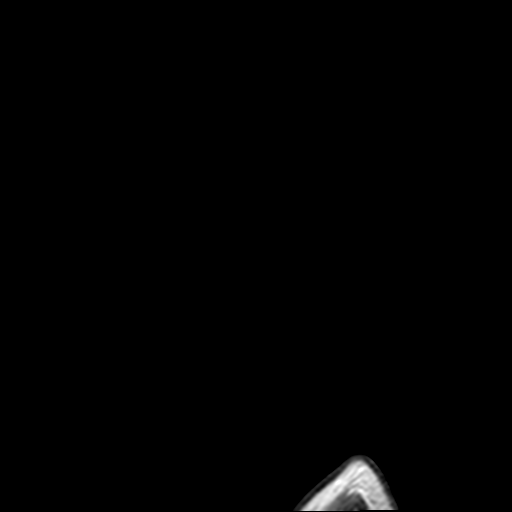

[Series 13: T2 · axial · 5.0mm · 0.60mm/px · 1 of 26 slices shown (1 of 3)]
[im 1/26]
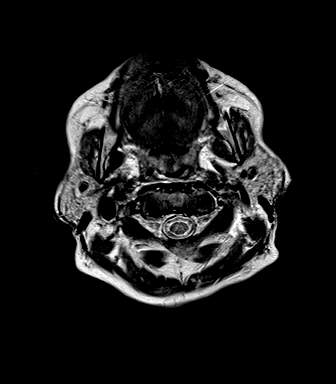

[Series 15: T2 · axial · 5.0mm · 0.45mm/px · 1 of 26 slices shown (2 of 3)]
[im 1/26]
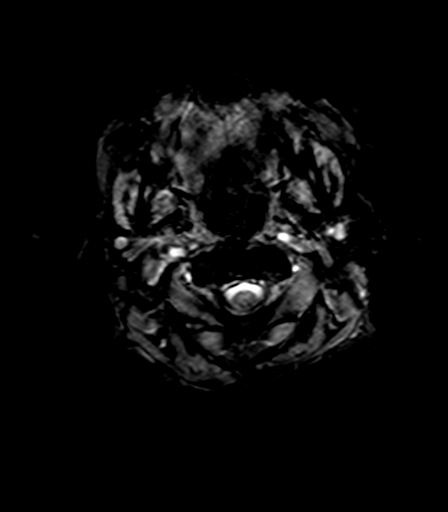

[Series 16: FLAIR · axial · 3.0mm · 0.45mm/px · z∈[-112,+44]mm · 3 of 53 slices shown]
[im 1/53]
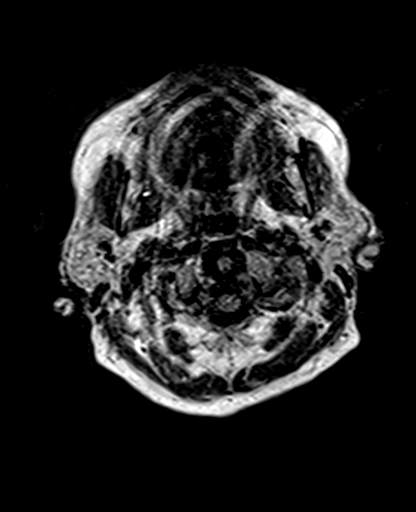
[im 27/53]
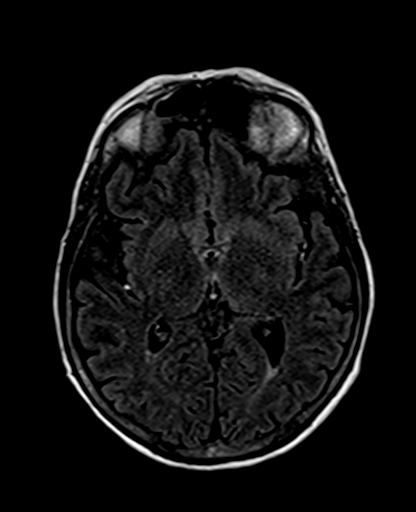
[im 53/53]
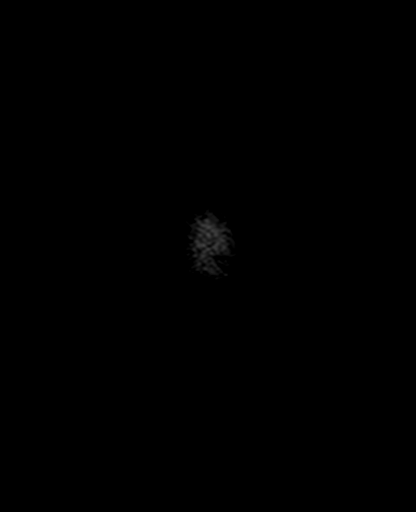

[Series 17: T1 · axial · 1.0mm · 1.00mm/px · z∈[-123,+51]mm · 8 of 176 slices shown (2 of 2)]
[im 1/176]
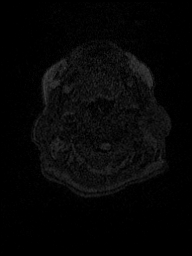
[im 20/176]
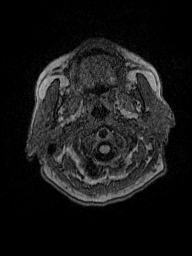
[im 59/176]
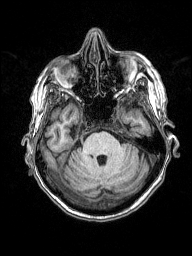
[im 78/176]
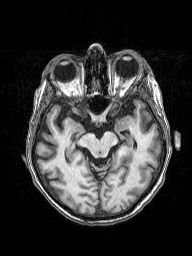
[im 98/176]
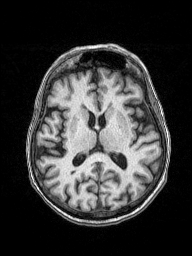
[im 117/176]
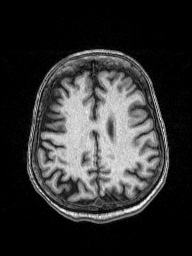
[im 156/176]
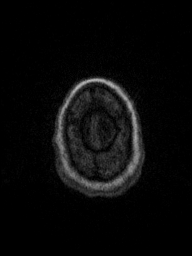
[im 176/176]
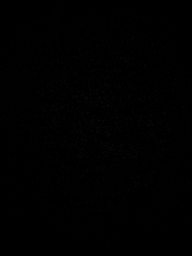

[Series 18: T2 · coronal · 5.0mm · 0.49mm/px · 1 of 27 slices shown (3 of 3)]
[im 1/27]
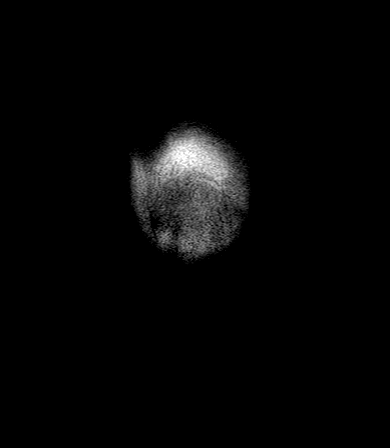

[Series 21: (id) · axial · 1.0mm · 0.52mm/px · z∈[-19,+28]mm · 3 of 96 slices shown]
[im 1/96]
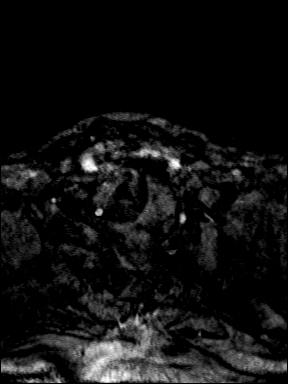
[im 24/96]
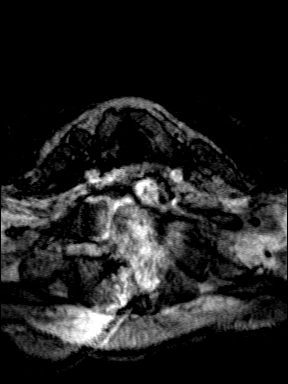
[im 48/96]
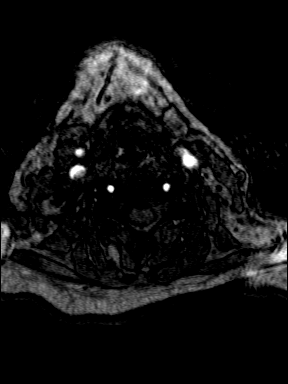

[34 of 48 positions shown; findings below may reference images not displayed]

FINDINGS: MR HEAD FINDINGS

Brain: There is no evidence of acute infarct, intracranial
hemorrhage, mass, midline shift, or extra-axial fluid collection.
Mild cerebral atrophy is not greater than expected for age.
Scattered small foci of cerebral white matter T2 hyperintensity have
slightly progressed from the prior MRI and are nonspecific but
compatible with mild chronic small vessel ischemic disease. A
chronic right periatrial white matter lacunar infarct is new from
the prior MRI. There are also a few new subcentimeter chronic right
cerebellar infarcts.

Vascular: Major intracranial vascular flow voids are preserved.

Skull and upper cervical spine: Unremarkable bone marrow signal.

Sinuses/Orbits: Unremarkable orbits. Trace bilateral mastoid
effusions. Clear paranasal sinuses.

Other: None.

MR CIRCLE OF WILLIS FINDINGS

The visualized distal vertebral arteries are widely patent to the
basilar. Patent right PICA, left AICA, and bilateral SCA origins are
identified. The basilar artery is widely patent. There is a large
right posterior communicating artery with diminutive right P1
segment. Both PCAs are patent without evidence of significant
proximal stenosis. There is some asymmetric attenuation of distal
right PCA branch vessels.

The internal carotid arteries are patent from skull base to carotid
termini without evidence of stenosis. ACAs and MCAs are patent
without evidence of proximal branch occlusion or significant
proximal stenosis. No aneurysm is identified.

MRA NECK FINDINGS

There is a standard 3 vessel aortic arch. The brachiocephalic and
subclavian arteries are widely patent. The carotid arteries are
patent without evidence of dissection or stenosis. The vertebral
arteries are patent and codominant with antegrade flow bilaterally
and no evidence of dissection or significant stenosis.
IMPRESSION: 1. No acute intracranial abnormality.
2. Mild chronic small vessel ischemic disease.
3. Small chronic cerebellar infarcts, new from 4139.
4. Patent circle of Willis without significant proximal stenosis.
5. Negative neck MRA.

## 2019-11-22 IMAGING — CT CT HEAD W/O CM
3 series · 16 of 45 positions shown, 19 images · non-contrast
Comparison: 12/15/2017 head CT

CLINICAL DATA: Sudden onset left-sided weakness.

EXAM:
CT HEAD WITHOUT CONTRAST
TECHNIQUE: Contiguous axial images were obtained from the base of the skull
through the vertex without intravenous contrast.

[Series 3: head wo · axial · 0.39mm/px · z∈[-162,-47]mm · 10 of 28 slices shown, 13 images]
[im 3/28  brain]
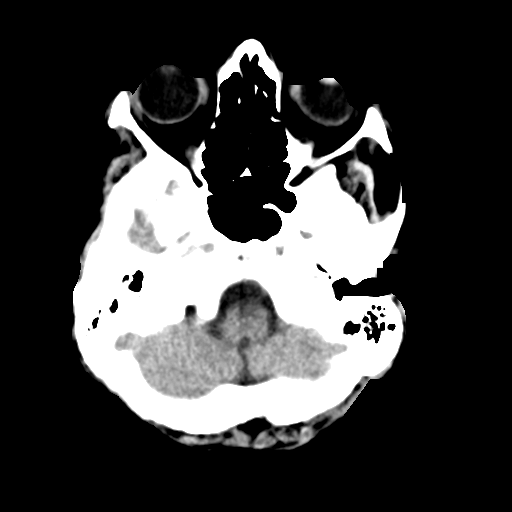
[im 3/28  bone]
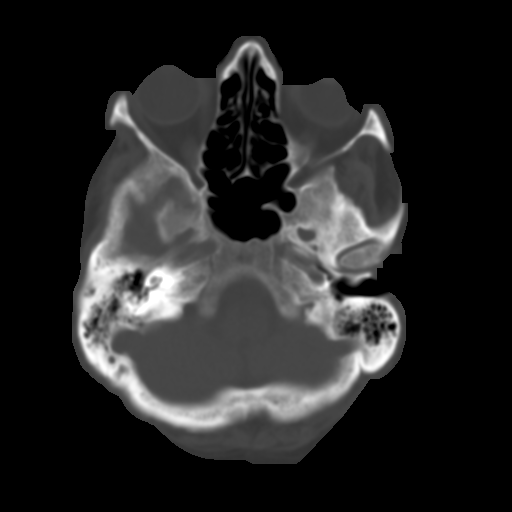
[im 5/28  brain]
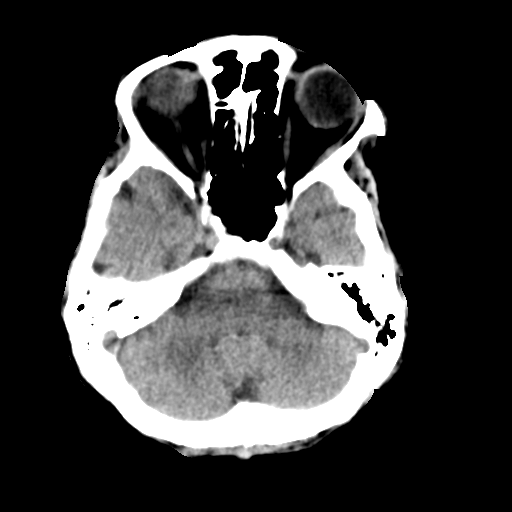
[im 8/28  brain]
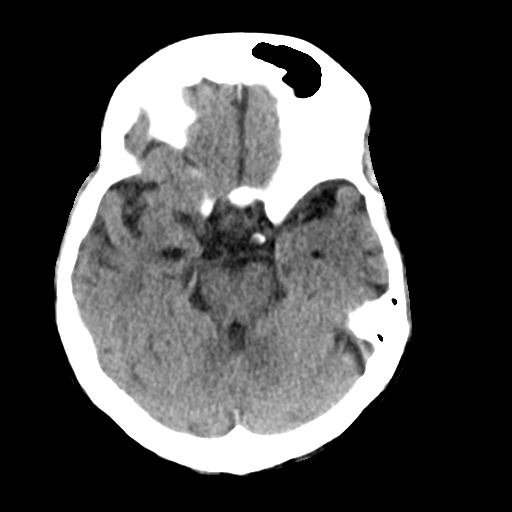
[im 11/28  brain]
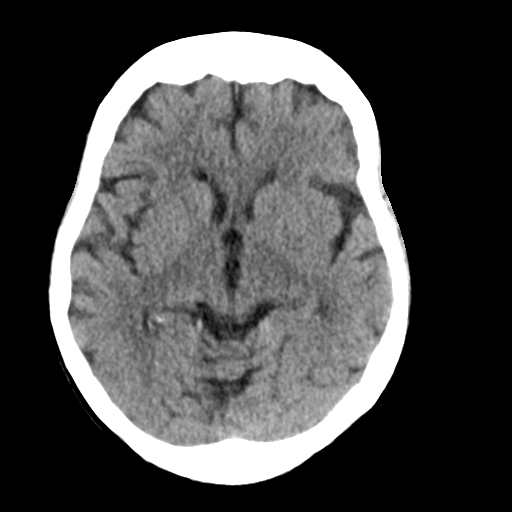
[im 13/28  brain]
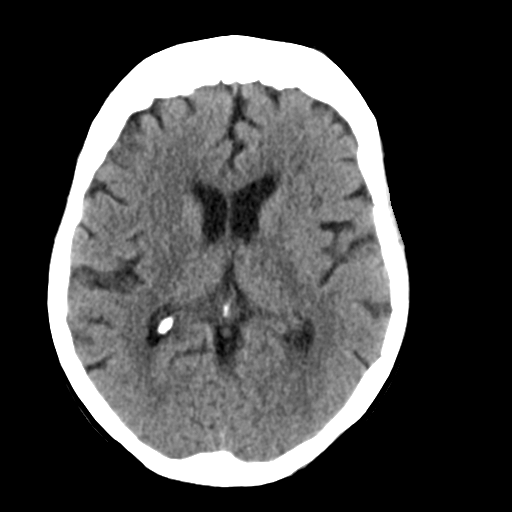
[im 13/28  bone]
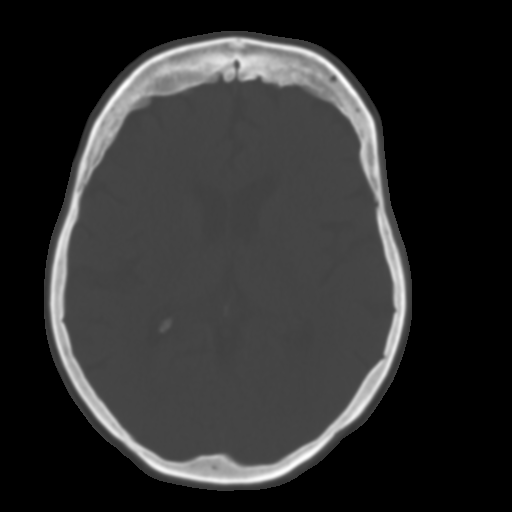
[im 16/28  brain]
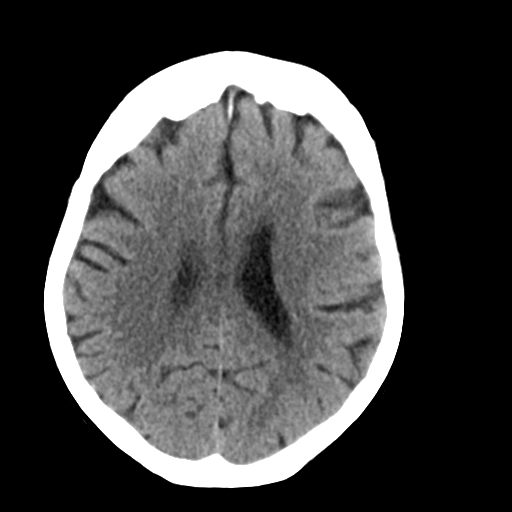
[im 18/28  brain]
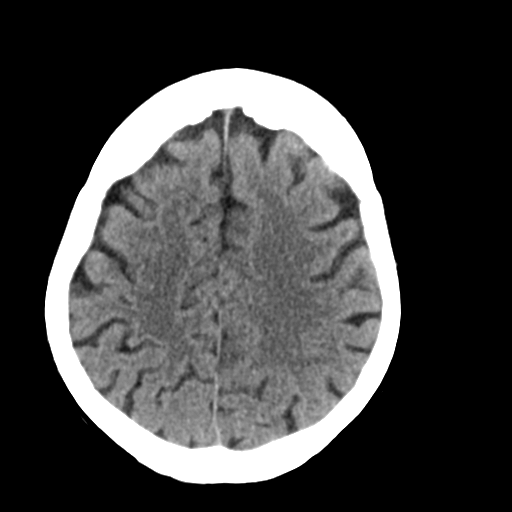
[im 21/28  brain]
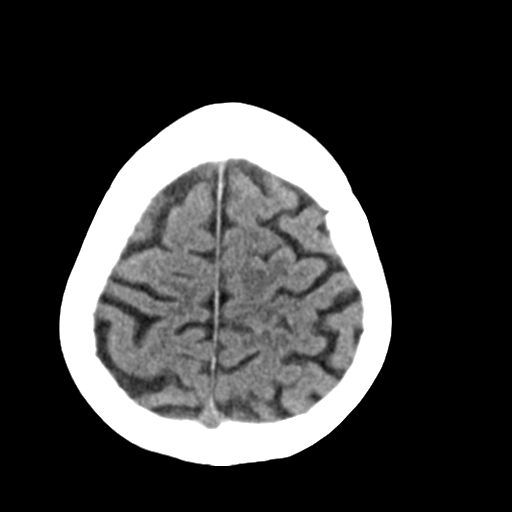
[im 24/28  brain]
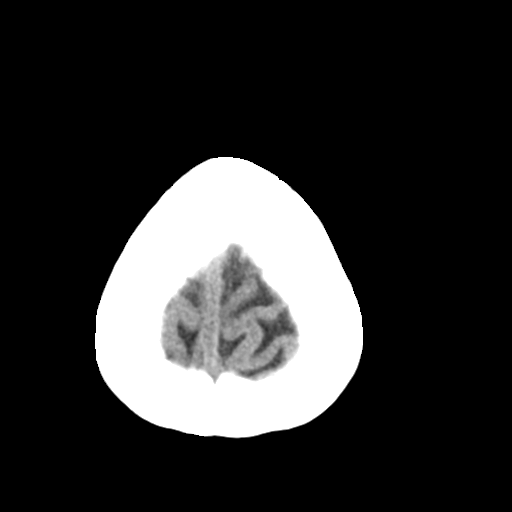
[im 24/28  bone]
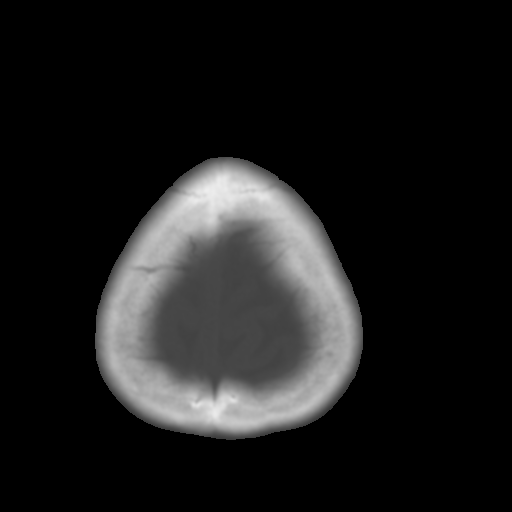
[im 26/28  brain]
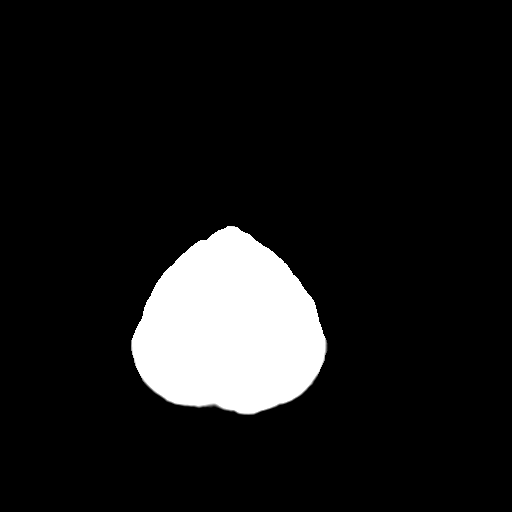

[Series 4: coronal soft tissue · coronal · 0.27mm/px · 3 of 59 slices shown]
[im 20/59  brain]
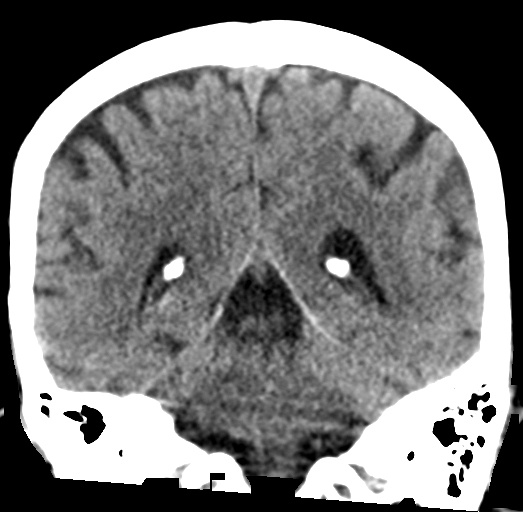
[im 26/59  brain]
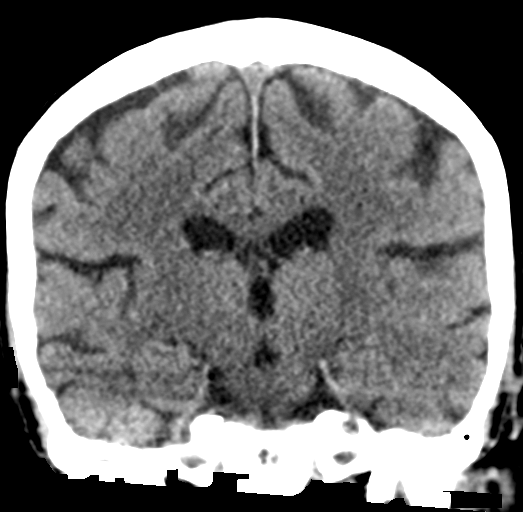
[im 33/59  brain]
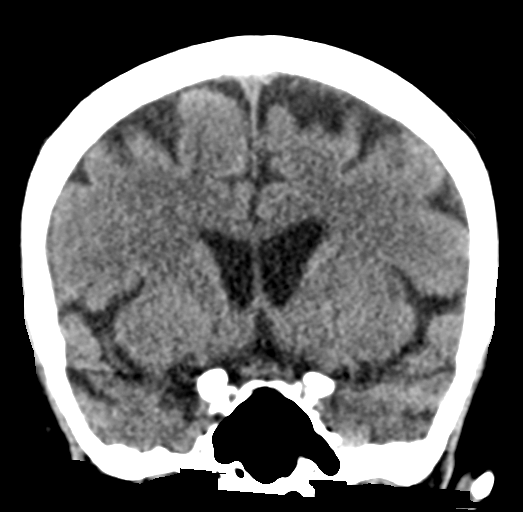

[Series 5: sagittal soft tissue · sagittal · 0.27mm/px · 3 of 48 slices shown]
[im 16/48  brain]
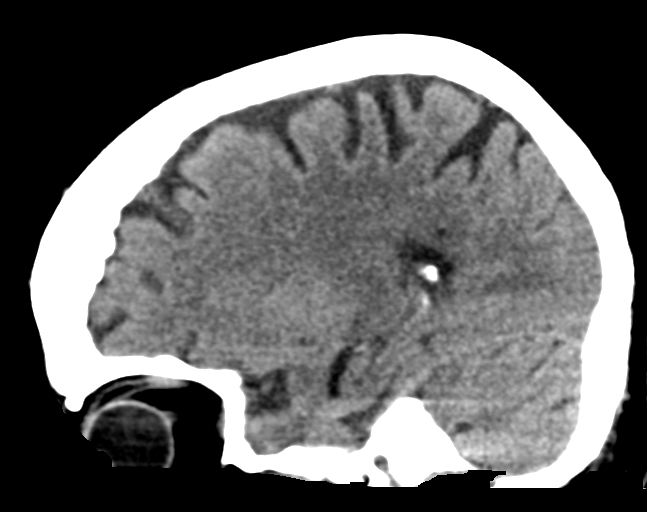
[im 24/48  brain]
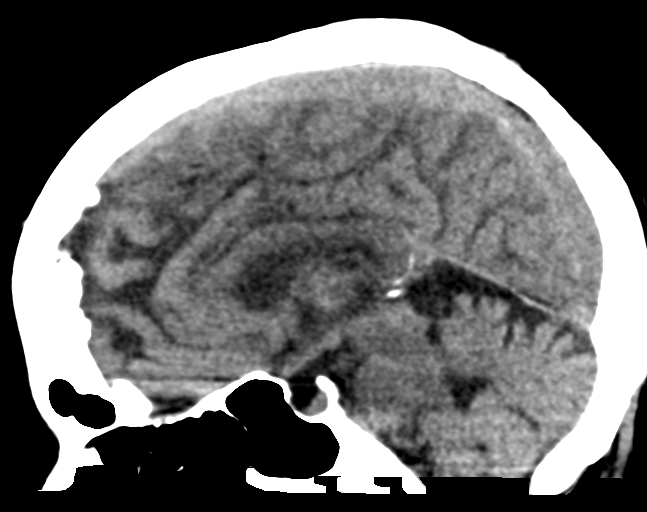
[im 32/48  brain]
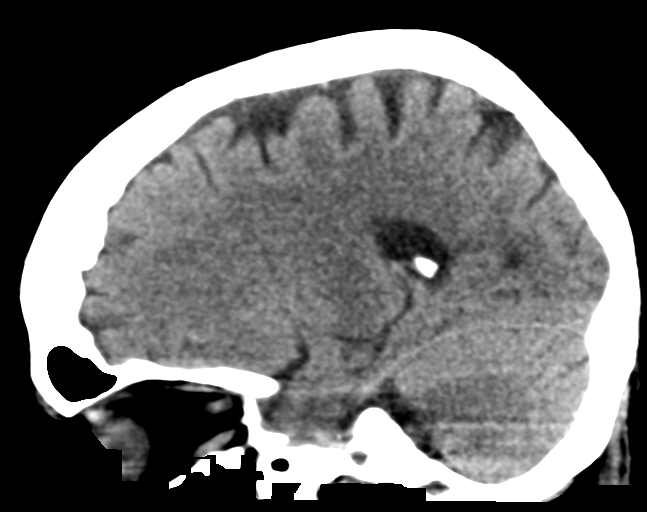

[16 of 45 positions shown; findings below may reference images not displayed]

FINDINGS: Brain: No mass lesion, intraparenchymal hemorrhage or extra-axial
collection. No evidence of acute cortical infarct. Normal appearance
of the brain parenchyma and extra axial spaces for age.

Vascular: No hyperdense vessel or unexpected vascular calcification.

Skull: Normal visualized skull base, calvarium and extracranial soft
tissues.

Sinuses/Orbits: No sinus fluid levels or advanced mucosal
thickening. No mastoid effusion. Normal orbits.
IMPRESSION: Normal aging brain.

## 2019-11-23 ENCOUNTER — Telehealth: Payer: Self-pay | Admitting: *Deleted

## 2019-11-23 ENCOUNTER — Emergency Department: Payer: Medicare Other

## 2019-11-23 ENCOUNTER — Other Ambulatory Visit: Payer: Self-pay

## 2019-11-23 ENCOUNTER — Inpatient Hospital Stay
Admission: EM | Admit: 2019-11-23 | Discharge: 2019-11-27 | DRG: 690 | Disposition: A | Discharge status: Home / Self Care | Payer: Medicare Other | Attending: Hospitalist | Admitting: Hospitalist

## 2019-11-23 DIAGNOSIS — Z20822 Contact with and (suspected) exposure to covid-19: Secondary | ICD-10-CM | POA: Diagnosis present

## 2019-11-23 DIAGNOSIS — I10 Essential (primary) hypertension: Secondary | ICD-10-CM | POA: Diagnosis present

## 2019-11-23 DIAGNOSIS — E785 Hyperlipidemia, unspecified: Secondary | ICD-10-CM | POA: Diagnosis present

## 2019-11-23 DIAGNOSIS — F329 Major depressive disorder, single episode, unspecified: Secondary | ICD-10-CM | POA: Diagnosis present

## 2019-11-23 DIAGNOSIS — Z885 Allergy status to narcotic agent status: Secondary | ICD-10-CM | POA: Diagnosis not present

## 2019-11-23 DIAGNOSIS — D649 Anemia, unspecified: Secondary | ICD-10-CM | POA: Diagnosis present

## 2019-11-23 DIAGNOSIS — Z87442 Personal history of urinary calculi: Secondary | ICD-10-CM | POA: Diagnosis not present

## 2019-11-23 DIAGNOSIS — R9341 Abnormal radiologic findings on diagnostic imaging of renal pelvis, ureter, or bladder: Secondary | ICD-10-CM | POA: Diagnosis present

## 2019-11-23 DIAGNOSIS — Z794 Long term (current) use of insulin: Secondary | ICD-10-CM

## 2019-11-23 DIAGNOSIS — Z87891 Personal history of nicotine dependence: Secondary | ICD-10-CM

## 2019-11-23 DIAGNOSIS — Z8619 Personal history of other infectious and parasitic diseases: Secondary | ICD-10-CM | POA: Diagnosis not present

## 2019-11-23 DIAGNOSIS — R0602 Shortness of breath: Secondary | ICD-10-CM | POA: Diagnosis not present

## 2019-11-23 DIAGNOSIS — Z888 Allergy status to other drugs, medicaments and biological substances status: Secondary | ICD-10-CM

## 2019-11-23 DIAGNOSIS — E1165 Type 2 diabetes mellitus with hyperglycemia: Secondary | ICD-10-CM | POA: Diagnosis present

## 2019-11-23 DIAGNOSIS — Z886 Allergy status to analgesic agent status: Secondary | ICD-10-CM | POA: Diagnosis not present

## 2019-11-23 DIAGNOSIS — A419 Sepsis, unspecified organism: Secondary | ICD-10-CM | POA: Diagnosis present

## 2019-11-23 DIAGNOSIS — Z8719 Personal history of other diseases of the digestive system: Secondary | ICD-10-CM | POA: Diagnosis not present

## 2019-11-23 DIAGNOSIS — N179 Acute kidney failure, unspecified: Secondary | ICD-10-CM

## 2019-11-23 DIAGNOSIS — I480 Paroxysmal atrial fibrillation: Secondary | ICD-10-CM | POA: Diagnosis present

## 2019-11-23 DIAGNOSIS — Z8249 Family history of ischemic heart disease and other diseases of the circulatory system: Secondary | ICD-10-CM

## 2019-11-23 DIAGNOSIS — Z8673 Personal history of transient ischemic attack (TIA), and cerebral infarction without residual deficits: Secondary | ICD-10-CM | POA: Diagnosis not present

## 2019-11-23 DIAGNOSIS — Z79899 Other long term (current) drug therapy: Secondary | ICD-10-CM

## 2019-11-23 DIAGNOSIS — Z9119 Patient's noncompliance with other medical treatment and regimen: Secondary | ICD-10-CM | POA: Diagnosis not present

## 2019-11-23 DIAGNOSIS — Z8542 Personal history of malignant neoplasm of other parts of uterus: Secondary | ICD-10-CM | POA: Diagnosis not present

## 2019-11-23 DIAGNOSIS — M5136 Other intervertebral disc degeneration, lumbar region: Secondary | ICD-10-CM | POA: Diagnosis present

## 2019-11-23 DIAGNOSIS — R7989 Other specified abnormal findings of blood chemistry: Secondary | ICD-10-CM | POA: Diagnosis present

## 2019-11-23 DIAGNOSIS — N136 Pyonephrosis: Secondary | ICD-10-CM | POA: Diagnosis present

## 2019-11-23 DIAGNOSIS — Z881 Allergy status to other antibiotic agents status: Secondary | ICD-10-CM

## 2019-11-23 DIAGNOSIS — E860 Dehydration: Secondary | ICD-10-CM | POA: Diagnosis present

## 2019-11-23 DIAGNOSIS — G8929 Other chronic pain: Secondary | ICD-10-CM | POA: Diagnosis present

## 2019-11-23 DIAGNOSIS — Z66 Do not resuscitate: Secondary | ICD-10-CM | POA: Diagnosis present

## 2019-11-23 DIAGNOSIS — M7918 Myalgia, other site: Secondary | ICD-10-CM | POA: Diagnosis present

## 2019-11-23 DIAGNOSIS — Z87440 Personal history of urinary (tract) infections: Secondary | ICD-10-CM

## 2019-11-23 DIAGNOSIS — Z90710 Acquired absence of both cervix and uterus: Secondary | ICD-10-CM

## 2019-11-23 DIAGNOSIS — R300 Dysuria: Secondary | ICD-10-CM | POA: Diagnosis not present

## 2019-11-23 DIAGNOSIS — Z887 Allergy status to serum and vaccine status: Secondary | ICD-10-CM

## 2019-11-23 DIAGNOSIS — R109 Unspecified abdominal pain: Secondary | ICD-10-CM | POA: Diagnosis not present

## 2019-11-23 LAB — COMPREHENSIVE METABOLIC PANEL
ALT: 22 U/L (ref 0–44)
AST: 21 U/L (ref 15–41)
Albumin: 3.1 g/dL — ABNORMAL LOW (ref 3.5–5.0)
Alkaline Phosphatase: 117 U/L (ref 38–126)
Anion gap: 9 (ref 5–15)
BUN: 17 mg/dL (ref 8–23)
CO2: 20 mmol/L — ABNORMAL LOW (ref 22–32)
Calcium: 8 mg/dL — ABNORMAL LOW (ref 8.9–10.3)
Chloride: 106 mmol/L (ref 98–111)
Creatinine, Ser: 1.32 mg/dL — ABNORMAL HIGH (ref 0.44–1.00)
GFR calc Af Amer: 49 mL/min — ABNORMAL LOW (ref >60)
GFR calc non Af Amer: 42 mL/min — ABNORMAL LOW (ref >60)
Glucose, Bld: 404 mg/dL — ABNORMAL HIGH (ref 70–99)
Potassium: 3.8 mmol/L (ref 3.5–5.1)
Sodium: 135 mmol/L (ref 135–145)
Total Bilirubin: 0.5 mg/dL (ref 0.3–1.2)
Total Protein: 6.3 g/dL — ABNORMAL LOW (ref 6.5–8.1)

## 2019-11-23 LAB — URINALYSIS, COMPLETE (UACMP) WITH MICROSCOPIC
Bilirubin Urine: NEGATIVE
Glucose, UA: >=500 mg/dL — AB
Ketones, ur: NEGATIVE mg/dL
Nitrite: NEGATIVE
Protein, ur: 30 mg/dL — AB
RBC / HPF: >50 RBC/hpf — ABNORMAL HIGH (ref 0–5)
Specific Gravity, Urine: 1.015 (ref 1.005–1.030)
pH: 6 (ref 5.0–8.0)

## 2019-11-23 LAB — CBC WITH DIFFERENTIAL/PLATELET
Abs Immature Granulocytes: 0.01 10*3/uL (ref 0.00–0.07)
Basophils Absolute: 0 10*3/uL (ref 0.0–0.1)
Basophils Relative: 0 %
Eosinophils Absolute: 0.1 10*3/uL (ref 0.0–0.5)
Eosinophils Relative: 1 %
HCT: 30.2 % — ABNORMAL LOW (ref 36.0–46.0)
Hemoglobin: 9.4 g/dL — ABNORMAL LOW (ref 12.0–15.0)
Immature Granulocytes: 0 %
Lymphocytes Relative: 18 %
Lymphs Abs: 0.8 10*3/uL (ref 0.7–4.0)
MCH: 25.6 pg — ABNORMAL LOW (ref 26.0–34.0)
MCHC: 31.1 g/dL (ref 30.0–36.0)
MCV: 82.3 fL (ref 80.0–100.0)
Monocytes Absolute: 0.3 10*3/uL (ref 0.1–1.0)
Monocytes Relative: 7 %
Neutro Abs: 3.1 10*3/uL (ref 1.7–7.7)
Neutrophils Relative %: 74 %
Platelets: 145 10*3/uL — ABNORMAL LOW (ref 150–400)
RBC: 3.67 MIL/uL — ABNORMAL LOW (ref 3.87–5.11)
RDW: 15.9 % — ABNORMAL HIGH (ref 11.5–15.5)
WBC: 4.2 10*3/uL (ref 4.0–10.5)
nRBC: 0 % (ref 0.0–0.2)

## 2019-11-23 LAB — LACTIC ACID, PLASMA
Lactic Acid, Venous: 2.3 mmol/L (ref 0.5–1.9)
Lactic Acid, Venous: 2 mmol/L (ref 0.5–1.9)

## 2019-11-23 MED ORDER — NITROGLYCERIN 0.3 MG SL SUBL
0.3000 mg | SUBLINGUAL_TABLET | SUBLINGUAL | Status: DC | PRN
  Filled 2019-11-23: qty 100

## 2019-11-23 MED ORDER — ALBUTEROL SULFATE HFA 108 (90 BASE) MCG/ACT IN AERS
2.0000 | INHALATION_SPRAY | Freq: Four times a day (QID) | RESPIRATORY_TRACT | Status: DC | PRN
  Filled 2019-11-23: qty 6.7

## 2019-11-23 MED ORDER — MAGNESIUM OXIDE 400 (241.3 MG) MG PO TABS
400.0000 mg | ORAL_TABLET | Freq: Two times a day (BID) | ORAL | Status: DC
  Administered 2019-11-24 – 2019-11-27 (×7): 400 mg via ORAL
  Filled 2019-11-23 (×7): qty 1

## 2019-11-23 MED ORDER — LACTATED RINGERS IV BOLUS
500.0000 mL | Freq: Once | INTRAVENOUS | Status: AC
  Administered 2019-11-23: 500 mL via INTRAVENOUS

## 2019-11-23 MED ORDER — MAGNESIUM HYDROXIDE 400 MG/5ML PO SUSP: 30.0000 mL | Freq: Every day | ORAL | Status: DC | PRN

## 2019-11-23 MED ORDER — SERTRALINE HCL 50 MG PO TABS
50.0000 mg | ORAL_TABLET | Freq: Every day | ORAL | Status: DC
  Administered 2019-11-24 – 2019-11-27 (×4): 50 mg via ORAL
  Filled 2019-11-23 (×4): qty 1

## 2019-11-23 MED ORDER — CYCLOBENZAPRINE HCL 10 MG PO TABS
10.0000 mg | ORAL_TABLET | Freq: Three times a day (TID) | ORAL | Status: DC | PRN
  Administered 2019-11-24 – 2019-11-27 (×7): 10 mg via ORAL
  Filled 2019-11-23 (×7): qty 1

## 2019-11-23 MED ORDER — IOHEXOL 300 MG/ML  SOLN
75.0000 mL | Freq: Once | INTRAMUSCULAR | Status: AC | PRN
  Administered 2019-11-23: 75 mL via INTRAVENOUS

## 2019-11-23 MED ORDER — SODIUM CHLORIDE 0.9 % IV SOLN: INTRAVENOUS | Status: DC

## 2019-11-23 MED ORDER — ACETAMINOPHEN 650 MG RE SUPP: 650.0000 mg | Freq: Four times a day (QID) | RECTAL | Status: DC | PRN

## 2019-11-23 MED ORDER — INSULIN ASPART 100 UNIT/ML ~~LOC~~ SOLN: 0.0000 [IU] | Freq: Three times a day (TID) | SUBCUTANEOUS | Status: DC

## 2019-11-23 MED ORDER — SODIUM CHLORIDE 0.9 % IV SOLN
2.0000 g | Freq: Once | INTRAVENOUS | Status: AC
  Administered 2019-11-23: 2 g via INTRAVENOUS
  Filled 2019-11-23: qty 20

## 2019-11-23 MED ORDER — SODIUM CHLORIDE 0.9 % IV BOLUS
1000.0000 mL | Freq: Once | INTRAVENOUS | Status: AC
  Administered 2019-11-23: 1000 mL via INTRAVENOUS

## 2019-11-23 MED ORDER — ENOXAPARIN SODIUM 40 MG/0.4ML ~~LOC~~ SOLN
40.0000 mg | SUBCUTANEOUS | Status: DC
  Administered 2019-11-24 – 2019-11-27 (×4): 40 mg via SUBCUTANEOUS
  Filled 2019-11-23 (×4): qty 0.4

## 2019-11-23 MED ORDER — OXYCODONE HCL 5 MG PO TABS
5.0000 mg | ORAL_TABLET | ORAL | Status: DC | PRN
  Administered 2019-11-23 – 2019-11-27 (×14): 5 mg via ORAL
  Filled 2019-11-23 (×15): qty 1

## 2019-11-23 MED ORDER — ACETAMINOPHEN 325 MG PO TABS: 650.0000 mg | ORAL_TABLET | Freq: Four times a day (QID) | ORAL | Status: DC | PRN

## 2019-11-23 MED ORDER — ZOLPIDEM TARTRATE 5 MG PO TABS: 5.0000 mg | ORAL_TABLET | Freq: Every evening | ORAL | Status: DC | PRN

## 2019-11-23 MED ORDER — SODIUM CHLORIDE 0.9 % IV SOLN
1.0000 g | INTRAVENOUS | Status: DC
  Filled 2019-11-23: qty 10

## 2019-11-23 MED ORDER — ATORVASTATIN CALCIUM 20 MG PO TABS
40.0000 mg | ORAL_TABLET | Freq: Every day | ORAL | Status: DC
  Administered 2019-11-24 – 2019-11-27 (×4): 40 mg via ORAL
  Filled 2019-11-23 (×4): qty 2

## 2019-11-23 MED ORDER — INSULIN DETEMIR 100 UNIT/ML FLEXPEN: 30.0000 [IU] | PEN_INJECTOR | SUBCUTANEOUS | Status: DC

## 2019-11-23 MED ORDER — VITAMIN D 25 MCG (1000 UNIT) PO TABS
1000.0000 [IU] | ORAL_TABLET | Freq: Every day | ORAL | Status: DC
  Administered 2019-11-24 – 2019-11-27 (×4): 1000 [IU] via ORAL
  Filled 2019-11-23 (×4): qty 1

## 2019-11-23 NOTE — Telephone Encounter (Signed)
Patient's husband called stating that he has never seen his wife like this before. Mr. Tagliaferri stated that his wife can not breathe and he thinks that she may be having an anxiety attack. Mrs. Loughney got on the phone and stated that she started with symptoms early this morning. Patient stated that she has been having some chest pain, SOB and her blood sugar has been 500.  Patient stated that she is having difficulty breathing. Advised Mrs. Ow that they need to call 911 to come out to evaluate her with her symptoms. Patient's husband got back on the phone and stated that she had not told him all of this earlier and he will call 911 as soon as he hangs up.

## 2019-11-23 NOTE — ED Notes (Signed)
Transported to CT 

## 2019-11-23 NOTE — Progress Notes (Signed)
Contacted Rebekah, bedside RN about collecting 2nd LA, RN immediately had lab drawn

## 2019-11-23 NOTE — ED Provider Notes (Signed)
[facility] Emergency Department Provider Note  ____________________________________________   First MD Initiated Contact with Patient 11/23/19 1745     (approximate)  I have reviewed the triage vital signs and the nursing notes.   HISTORY  Chief Complaint Dizziness    HPI [name] is a 64 y.o. female with extensive past medical history as below including HTN, pAFib, sick sinus syndrome, DM, recurrent UTIs, h/o diverticulitis and C. Diff colitis, h/o intussusception, here with dizziness, nausea, vomiting, and diarrhea. Pt reports that for the past 3 days, she has had worsening weakness, nausea, and abdominal pain. The pain began as a mild, suprapubic pain and has now spread throughout her abdomen. She's had associated nausea, vomiting, and one loose stool though she denies overt diarrhea. She has had associated fever, chills, and poor appetite. Has not been able to eat/drink much for 2 days. She reports that she now feels increasingly weak and was essentially unable to walk unsupported, so she presents to the ED. She does note some frequency of urine as well as change in odor. No dysuria. No flank pain. No blood in emesis or stool. No known sick contacts.       Past Medical History:  Diagnosis Date  . Acute encephalopathy 05/22/2018  . Allergy   . Anxiety   . Ascites   . C. difficile colitis 07/10/2015  . Cancer (HCC)    HX OF CANCER OF UTERUS   . Cirrhosis of liver not due to alcohol ([place]) 2016  . Degenerative disk disease   . Diverticulitis   . Gastroparesis   . GERD (gastroesophageal reflux disease)   . History of hiatal hernia   . Hypertension   . Hypothyroid   . Hypothyroidism due to amiodarone   . Ileus ([place]) 08/01/2015  . Intussusception intestine ([place]) 05/2015  . Orthostatic hypotension   . PAF (paroxysmal atrial fibrillation) ([place]) 03/2015   a. new onset 03/2015 in setting of intractable N/V; b. on Eliquis 5 mg bid; c. CHADSVASc 4  (DM, TIA x 2, female)  . Pancreatitis   . Pneumonia 11/14/2015  . Right ureteral stone 07/14/2016  . Sepsis ([place]) 12/15/2017  . Sick sinus syndrome ([place])   . Stomach ulcer   . Stroke [facility])    with minimal left sided weakness  . Syncope 01/2015  . Syncope due to orthostatic hypotension 05/18/2015  . Tachyarrhythmia 01/10/2016  . TIA (transient ischemic attack) 02/2015  . Type 1 diabetes ([place])    on levemir  . UTERINE CANCER, HX OF 03/27/2007   Qualifier: Diagnosis of  By: Maxie Better FNP, Rosalita Levan   . UTI (urinary tract infection) 05/22/2018    Patient Active Problem List   Diagnosis Date Noted  . Sepsis ([place]) 11/23/2019  . Acute pyelonephritis 09/30/2019  . Acute pain of left foot 08/18/2019  . Type II diabetes mellitus with renal manifestations ([place]) 08/13/2019  . Weakness of both lower extremities 07/31/2019  . Altered mental status 07/14/2019  . GAD (generalized anxiety disorder) 06/11/2019  . Poor memory 06/11/2019  . Constipation 05/25/2019  . Acute medial meniscus tear of left knee 05/25/2019  . AKI (acute kidney injury) ([place]) 05/16/2019  . Closed fracture of left distal fibula 05/16/2019  . Sinus tachycardia 05/05/2019  . Congestive dilated cardiomyopathy ([place]) 04/14/2019  . CAD (coronary artery disease) 04/10/2019  . Closed fracture of lateral malleolus 04/10/2019  . Lumbar facet syndrome (Bilateral) (R>L) 03/09/2019  . Long term (current) use of anticoagulants 03/09/2019  .  Unspecified cirrhosis of liver ([place]) 02/11/2019  . Hypoalbuminemia 02/11/2019  . Edema due to hypoalbuminemia 02/11/2019  . Lower extremity edema 01/26/2019  . Prolonged QT interval 12/17/2018  . Hypomagnesemia 12/17/2018  . Uncontrolled type 2 diabetes mellitus ([place]) 12/17/2018  . H/O TIA (transient ischemic attack) and stroke 12/17/2018  . Personal history of surgery to heart and great vessels, presenting hazards to health 12/17/2018  . DDD (degenerative disc disease), lumbar 12/17/2018  .  Lumbar intervertebral disc displacement 12/17/2018  . Chronic musculoskeletal pain 12/17/2018  . Chronic generalized pain 12/17/2018  . Hyponatremia 12/17/2018  . Fibromyalgia syndrome 12/17/2018  . Other intervertebral disc displacement, lumbar region 12/17/2018  . Vitamin D deficiency 11/26/2018  . Elevated C-reactive protein (CRP) 11/26/2018  . Elevated sed rate 11/26/2018  . Chronic low back pain (Primary area of Pain) (Bilateral) (L>R) w/ sciatica (Bilateral) 11/25/2018  . Chronic lower extremity pain (Secondary Area of Pain) (Bilateral) (L>R) 11/25/2018  . Chronic neck pain 11/25/2018  . Pharmacologic therapy 11/25/2018  . Disorder of skeletal system 11/25/2018  . Problems influencing health status 11/25/2018  . Renal mass 10/06/2018  . Chronic intermittent abdominal pain 09/25/2018  . Unspecified abdominal pain 09/25/2018  . Frequent falls 09/12/2018  . Orthostatic hypotension 08/18/2018  . Normochromic normocytic anemia 08/18/2018  . Acute renal failure superimposed on stage 3a chronic kidney disease ([place]) 08/18/2018  . History of hypothyroidism 08/14/2018  . Medical non-compliance 08/14/2018  . Diabetic ketoacidosis ([place]) 05/22/2018  . UTI (urinary tract infection) 05/22/2018  . NSTEMI (non-ST elevated myocardial infarction) ([place]) 01/11/2016  . Tachy-brady syndrome ([place]) 12/28/2015  . PAF (paroxysmal atrial fibrillation) ([place]) 11/24/2015  . Type 2 diabetes mellitus with neurologic complication ([place]) 78/93/8101  . S/P cholecystectomy 11/23/2015  . Narcotic abuse ([place]) 11/22/2015  . Chronic superficial gastritis without bleeding 11/22/2015  . History of Clostridium difficile 11/22/2015  . History of TIA (transient ischemic attack) 11/22/2015  . Mixed hyperlipidemia 11/22/2015  . Diverticulosis 11/22/2015  . History of uterine cancer 11/22/2015  . Hypothyroidism, unspecified 11/22/2015  . Intractable cyclical vomiting with nausea 11/22/2015  . Narcotic withdrawal ([place])  11/11/2015  . Hyperlipidemia 08/31/2015  . Hypotension 08/06/2015  . Cryptogenic cirrhosis ([place]) 07/10/2015  . Atrial fibrillation ([place]) 07/10/2015  . Malnutrition of moderate degree 07/04/2015  . Symptomatic bradycardia 05/27/2015  . Chronic anemia 05/19/2015  . Thrombocytopenia ([place]) 05/19/2015  . Hypokalemia 04/06/2015  . Hyperlipidemia with target LDL less than 100 04/06/2015  . Syncope 03/11/2015  . CVA (cerebral vascular accident) ([place]) 02/15/2015  . Essential hypertension 01/12/2015  . Chronically on opiate therapy 01/12/2015  . Cirrhosis of liver not due to alcohol ([place]) 2016  . S/P Nissen fundoplication (without gastrostomy tube) procedure 05/11/2014  . Dysphagia 04/19/2014  . DEPRESSION/ANXIETY 06/27/2007  . Myofascial pain syndrome 06/27/2007  . Chronic pain syndrome 03/28/2007  . GERD 03/27/2007  . Diverticulosis of large intestine without perforation or abscess without bleeding 03/27/2007  . Proteinuria 03/27/2007    Past Surgical History:  Procedure Laterality Date  . ABDOMINAL HYSTERECTOMY    . CARDIAC CATHETERIZATION N/A 01/12/2016   Procedure: Left Heart Cath and Coronary Angiography;  Surgeon: Wellington Hampshire, MD;  Location: [facility];  Service: Cardiovascular;  Laterality: N/A;  . CHOLECYSTECTOMY    . CYSTOSCOPY/URETEROSCOPY/HOLMIUM LASER Right 07/14/2016   Procedure: CYSTOSCOPY/URETEROSCOPY/HOLMIUM LASER;  Surgeon: Alexis Frock, MD;  Location: [facility] ORS;  Service: Urology;  Laterality: Right;  . ESOPHAGOGASTRODUODENOSCOPY N/A 04/04/2015   Procedure: ESOPHAGOGASTRODUODENOSCOPY (EGD);  Surgeon: Lupita Dawn  Candace Cruise, MD;  Location: [facility] ENDOSCOPY;  Service: Endoscopy;  Laterality: N/A;  . ESOPHAGOGASTRODUODENOSCOPY N/A 12/28/2017   Procedure: ESOPHAGOGASTRODUODENOSCOPY (EGD);  Surgeon: Lin Landsman, MD;  Location: [facility] ENDOSCOPY;  Service: Gastroenterology;  Laterality: N/A;  . ESOPHAGOGASTRODUODENOSCOPY (EGD) WITH PROPOFOL N/A 01/18/2016   Procedure:  ESOPHAGOGASTRODUODENOSCOPY (EGD) WITH PROPOFOL;  Surgeon: Lucilla Lame, MD;  Location: [facility] ENDOSCOPY;  Service: Endoscopy;  Laterality: N/A;  . FLEXIBLE SIGMOIDOSCOPY N/A 01/18/2016   Procedure: FLEXIBLE SIGMOIDOSCOPY;  Surgeon: Lucilla Lame, MD;  Location: [facility] ENDOSCOPY;  Service: Endoscopy;  Laterality: N/A;  . HERNIA REPAIR      Prior to Admission medications   Medication Sig Start Date End Date Taking? Authorizing Provider  albuterol (PROVENTIL HFA;VENTOLIN HFA) 108 (90 Base) MCG/ACT inhaler Inhale 2 puffs into the lungs every 6 (six) hours as needed for wheezing or shortness of breath. 11/27/18  Yes Epifanio Lesches, MD  atorvastatin (LIPITOR) 40 MG tablet Take 40 mg by mouth daily.   Yes [provider]  cholecalciferol (VITAMIN D3) 25 MCG (1000 UNIT) tablet Take 1,000 Units by mouth daily.   Yes [provider]  cyclobenzaprine (FLEXERIL) 10 MG tablet Take 1 tablet (10 mg total) by mouth 3 (three) times daily as needed for muscle spasms. Must last 30 days. 10/11/19 04/08/20 Yes Milinda Pointer, MD  glucose blood (ACCU-CHEK AVIVA PLUS) test strip Use as instructed to test blood sugar up to 4 times daily 10/09/19  Yes [name], NP  Insulin Detemir (LEVEMIR) 100 UNIT/ML Pen Inject 30 Units into the skin 2 (two) times daily. Patient taking differently: Inject 30-34 Units into the skin See admin instructions. Inject 34u under the skin every morning and inject 30u under the skin every night 10/03/19  Yes Enzo Bi, MD  magnesium gluconate (MAGONATE) 500 MG tablet Take 500 mg by mouth 2 (two) times daily.   Yes [provider]  nitroGLYCERIN (NITROSTAT) 0.3 MG SL tablet Place 1 tablet (0.3 mg total) under the tongue every 5 (five) minutes as needed for chest pain. 07/24/19 07/23/20 Yes Nena Polio, MD  ondansetron (ZOFRAN ODT) 4 MG disintegrating tablet Take 1 tablet (4 mg total) by mouth every 8 (eight) hours as needed for nausea or vomiting. 08/26/19  Yes  Carrie Mew, MD  oxyCODONE (OXY IR/ROXICODONE) 5 MG immediate release tablet Take 1 tablet (5 mg total) by mouth every 4 (four) hours as needed for severe pain. Must last 30 days. Max: 6/day 11/10/19 12/10/19 Yes Milinda Pointer, MD  sertraline (ZOLOFT) 50 MG tablet Take 1 tablet (50 mg total) by mouth daily. For anxiety. 10/20/19  Yes [name], NP  midodrine (PROAMATINE) 5 MG tablet Hold this medication until outpatient PCP followup because your blood pressure has been elevated during hospitalization. Patient not taking: Reported on 10/20/2019 10/03/19   Enzo Bi, MD  oxyCODONE (OXY IR/ROXICODONE) 5 MG immediate release tablet Take 1 tablet (5 mg total) by mouth every 4 (four) hours as needed for severe pain. Must last 30 days. Max: 6/day 10/11/19 11/10/19  Milinda Pointer, MD  oxyCODONE (OXY IR/ROXICODONE) 5 MG immediate release tablet Take 1 tablet (5 mg total) by mouth every 4 (four) hours as needed for severe pain. Must last 30 days. Max: 6/day 12/10/19 01/09/20  Milinda Pointer, MD    Allergies Aspirin, Codeine sulfate, Erythromycin, Prednisone, Rosiglitazone maleate, and Tetanus-diphtheria toxoids td  Family History  Problem Relation Age of Onset  . Hypertension Mother   . CAD Sister   . Heart attack Sister  Deceased 10/2014  . CAD Brother     Social History Social History   Tobacco Use  . Smoking status: Former Smoker    Types: Cigarettes  . Smokeless tobacco: Never Used  . Tobacco comment: 25 years ago and only smoked occasionally  Substance Use Topics  . Alcohol use: No  . Drug use: No    Review of Systems  Review of Systems  Constitutional: Positive for chills, fatigue and fever.  HENT: Negative for congestion and sore throat.   Eyes: Negative for visual disturbance.  Respiratory: Negative for cough and shortness of breath.   Cardiovascular: Negative for chest pain.  Gastrointestinal: Positive for abdominal pain, nausea and vomiting. Negative for  diarrhea.  Genitourinary: Positive for decreased urine volume and frequency. Negative for flank pain.  Musculoskeletal: Negative for back pain and neck pain.  Skin: Negative for rash and wound.  Neurological: Positive for weakness.  All other systems reviewed and are negative.    ____________________________________________  PHYSICAL EXAM:      VITAL SIGNS: ED Triage Vitals  Enc Vitals Group     BP 11/23/19 1733 111/70     Pulse Rate 11/23/19 1733 (!) 112     Resp 11/23/19 1733 13     Temp 11/23/19 1733 98.8 F (37.1 C)     Temp Source 11/23/19 1733 Oral     SpO2 11/23/19 1733 98 %     Weight 11/23/19 1725 180 lb (81.6 kg)     Height 11/23/19 1725 5' 5"  (1.651 m)     Head Circumference --      Peak Flow --      Pain Score 11/23/19 1725 10     Pain Loc --      Pain Edu? --      Excl. in [place]? --      Physical Exam Vitals and nursing note reviewed.  Constitutional:      General: She is not in acute distress.    Appearance: She is well-developed.  HENT:     Head: Normocephalic and atraumatic.     Mouth/Throat:     Mouth: Mucous membranes are dry.  Eyes:     Conjunctiva/sclera: Conjunctivae normal.  Cardiovascular:     Rate and Rhythm: Regular rhythm. Tachycardia present.     Heart sounds: Normal heart sounds. No murmur. No friction rub.  Pulmonary:     Effort: Pulmonary effort is normal. No respiratory distress.     Breath sounds: Normal breath sounds. No wheezing or rales.  Abdominal:     General: Abdomen is flat. Bowel sounds are increased. There is no distension.     Palpations: Abdomen is soft.     Tenderness: There is abdominal tenderness in the right lower quadrant, periumbilical area, suprapubic area and left lower quadrant. There is no guarding or rebound.  Musculoskeletal:     Cervical back: Neck supple.  Skin:    General: Skin is warm.     Capillary Refill: Capillary refill takes less than 2 seconds.  Neurological:     Mental Status: She is alert and  oriented to person, place, and time.     Motor: No abnormal muscle tone.       ____________________________________________   LABS (all labs ordered are listed, but only abnormal results are displayed)  Labs Reviewed  CBC WITH DIFFERENTIAL/PLATELET - Abnormal; Notable for the following components:      Result Value   RBC 3.67 (*)    Hemoglobin 9.4 (*)  HCT 30.2 (*)    MCH 25.6 (*)    RDW 15.9 (*)    Platelets 145 (*)    All other components within normal limits  COMPREHENSIVE METABOLIC PANEL - Abnormal; Notable for the following components:   CO2 20 (*)    Glucose, Bld 404 (*)    Creatinine, Ser 1.32 (*)    Calcium 8.0 (*)    Total Protein 6.3 (*)    Albumin 3.1 (*)    GFR calc non Af Amer 42 (*)    GFR calc Af Amer 49 (*)    All other components within normal limits  LACTIC ACID, PLASMA - Abnormal; Notable for the following components:   Lactic Acid, Venous 2.3 (*)    All other components within normal limits  LACTIC ACID, PLASMA - Abnormal; Notable for the following components:   Lactic Acid, Venous 2.0 (*)    All other components within normal limits  URINALYSIS, COMPLETE (UACMP) WITH MICROSCOPIC - Abnormal; Notable for the following components:   Color, Urine YELLOW (*)    APPearance HAZY (*)    Glucose, UA >=500 (*)    Hgb urine dipstick LARGE (*)    Protein, ur 30 (*)    Leukocytes,Ua TRACE (*)    RBC / HPF >50 (*)    Bacteria, UA MANY (*)    All other components within normal limits  CULTURE, BLOOD (ROUTINE X 2)  CULTURE, BLOOD (ROUTINE X 2)  URINE CULTURE    ____________________________________________  EKG: Sinus tachycardia, ventricular rate 111.  QRS 87, QTc 515.  Prolonged QT.  No acute ST elevations or depressions. ________________________________________  RADIOLOGY All imaging, including plain films, CT scans, and ultrasounds, independently reviewed by me, and interpretations confirmed via formal radiology reads.  ED MD interpretation:     Chest x-ray: Slight bronchovascular crowding, no focal pneumonia  Official radiology report(s): CT ABDOMEN PELVIS W CONTRAST  Result Date: 11/23/2019 CLINICAL DATA:  Nausea, vomiting, diarrhea for 3 days. Fever. Diverticulitis suspected. EXAM: CT ABDOMEN AND PELVIS WITH CONTRAST TECHNIQUE: Multidetector CT imaging of the abdomen and pelvis was performed using the standard protocol following bolus administration of intravenous contrast. CONTRAST:  46m OMNIPAQUE IOHEXOL 300 MG/ML  SOLN COMPARISON:  Abdominal CT 09/30/2019 FINDINGS: Lower chest: Minor atelectasis in the lung bases. No pleural fluid. Hepatobiliary: Cirrhotic hepatic morphology with nodular contours. No focal lesion. Postcholecystectomy without biliary dilatation. Pancreas: Parenchymal atrophy. No ductal dilatation or inflammation. Spleen: Splenomegaly with spleen measuring 14.5 AP. Adrenals/Urinary Tract: Right hydroureteronephrosis appears chronic and similar to prior exam. Suggestion of proximal ureteric enhancement. There is mild right perinephric edema. Mild right renal parenchymal thinning. No left hydronephrosis or hydroureter. Punctate nonobstructing stone in the mid left kidney. There is delayed excretion on delayed phase imaging from the right kidney. Urinary bladder is partially distended. There is wall thickening about the right dome in left bladder base. Mild perivesicular fat stranding. Stomach/Bowel: Varices about the distal esophagus and gastroesophageal junction. Ingested material in the stomach. Normal positioning of the ligament of Treitz. No small bowel obstruction or inflammation. No terminal ileal inflammation. Appendix tentatively visualized draped posterior to the cecum. There is no evidence of appendicitis. Colonic tortuosity with small to moderate stool burden. No significant diverticular disease. No diverticulitis. Vascular/Lymphatic: Aortic atherosclerosis without aneurysm. The portal vein is patent. Varices in the  left upper quadrant. Paraesophageal varices extend to the gastroesophageal junction. Multiple small retroperitoneal and periportal lymph nodes are likely reactive. Reproductive: Status post hysterectomy. No adnexal masses. Other:  Trace fluid in the right pericolic gutter. No free air. No intra-abdominal abscess. Musculoskeletal: There are no acute or suspicious osseous abnormalities. Scoliotic curvature of spine. IMPRESSION: 1. Bladder wall thickening with perivesicular fat stranding. Right hydronephrosis and proximal hydroureter with ureteral thickening and perinephric edema. Findings are suspicious for cystitis and ascending right urinary tract infection, however similar findings were also seen on prior CT in January. Recommend correlation with urinalysis. 2. Cirrhotic hepatic morphology with portal hypertension. Splenomegaly and paraesophageal and left upper quadrant varices. 3. Punctate nonobstructing stone in the left kidney. 4.  Aortic Atherosclerosis (ICD10-I70.0). Aortic Atherosclerosis (ICD10-I70.0). Electronically Signed   By: Keith Rake M.D.   On: 11/23/2019 20:59   DG Chest Portable 1 View  Result Date: 11/23/2019 CLINICAL DATA:  Cough. Dizziness. Fever. EXAM: PORTABLE CHEST 1 VIEW COMPARISON:  Radiograph 09/30/2019 FINDINGS: Low lung volumes. Patient is rotated. Borderline cardiomegaly, likely accentuated by technique. Aortic atherosclerosis. Bronchovascular crowding versus bronchial thickening. No confluent airspace disease. No pleural effusion or pneumothorax. No acute osseous abnormalities are seen. IMPRESSION: Low lung volumes. Bronchovascular crowding versus bronchial thickening. Electronically Signed   By: Keith Rake M.D.   On: 11/23/2019 19:02    ____________________________________________  PROCEDURES   Procedure(s) performed (including Critical Care):  .Critical Care Performed by: Duffy Bruce, MD Authorized by: Duffy Bruce, MD   Critical care provider  statement:    Critical care time (minutes):  35   Critical care time was exclusive of:  Separately billable procedures and treating other patients and teaching time   Critical care was necessary to treat or prevent imminent or life-threatening deterioration of the following conditions:  Cardiac failure, circulatory failure and sepsis   Critical care was time spent personally by me on the following activities:  Development of treatment plan with patient or surrogate, discussions with consultants, evaluation of patient's response to treatment, examination of patient, obtaining history from patient or surrogate, ordering and performing treatments and interventions, ordering and review of laboratory studies, ordering and review of radiographic studies, pulse oximetry, re-evaluation of patient's condition and review of old charts   I assumed direction of critical care for this patient from another provider in my specialty: no      ____________________________________________  INITIAL IMPRESSION / MDM / [place] / ED COURSE  As part of my medical decision making, I reviewed the following data within the [place] notes reviewed and incorporated, Old chart reviewed, Notes from prior ED visits, and [place] Controlled Substance [place] was evaluated in Emergency Department on 11/23/2019 for the symptoms described in the history of present illness. She was evaluated in the context of the global COVID-19 pandemic, which necessitated consideration that the patient might be at risk for infection with the SARS-CoV-2 virus that causes COVID-19. Institutional protocols and algorithms that pertain to the evaluation of patients at risk for COVID-19 are in a state of rapid change based on information released by regulatory bodies including the CDC and federal and state organizations. These policies and algorithms were followed during the patient's care in the ED.   Some ED evaluations and interventions may be delayed as a result of limited staffing during the pandemic.*     Medical Decision Making:  64 yo F here with nausea, vomiting, fever, and weakness. Suspect sepsis 2/2 UTI, with UA c/w UTI and known history of similar presentations. Otherwise, she is hyperglycemic with CO2 20 but normal AG, suspect this is  2/2 infection with mild dehydration. IVF, ABX started. LA elevated at 2.3 to 2.0. Will obtain CT given her extensive abd surgical history, plan to admit for likely sepsis from UTI. ____________________________________________  FINAL CLINICAL IMPRESSION(S) / ED DIAGNOSES  Final diagnoses:  Sepsis secondary to UTI [facility])     MEDICATIONS GIVEN DURING THIS VISIT:  Medications  lactated ringers bolus 500 mL (has no administration in time range)  sodium chloride 0.9 % bolus 1,000 mL (0 mLs Intravenous Stopped 11/23/19 2214)  sodium chloride 0.9 % bolus 1,000 mL (1,000 mLs Intravenous New Bag/Given 11/23/19 2225)  cefTRIAXone (ROCEPHIN) 2 g in sodium chloride 0.9 % 100 mL IVPB (0 g Intravenous Stopped 11/23/19 2056)  iohexol (OMNIPAQUE) 300 MG/ML solution 75 mL (75 mLs Intravenous Contrast Given 11/23/19 2028)     ED Discharge Orders    None       Note:  This document was prepared using Dragon voice recognition software and may include unintentional dictation errors.   Duffy Bruce, MD 11/23/19 2226

## 2019-11-23 NOTE — ED Triage Notes (Signed)
Pt arrives via [facility] from home for dizziness on standing, nausea, vomiting and diarrhea x 3 days. Pt febrile with EMS 100.8. Other vitals stable. Pt states she does not know where she is and is unsure who called EMS. Received 1,000 mg tylenol in route at 1709 and 100cc of NS IV. CBG 592 with EMS. Reports lower back pain.

## 2019-11-23 NOTE — ED Notes (Signed)
Report given to Eugene Garnet, RN

## 2019-11-23 NOTE — ED Notes (Signed)
Dr. Ellender Hose informed of critical lactic

## 2019-11-23 NOTE — Consult Note (Signed)
CODE SEPSIS - PHARMACY COMMUNICATION  **Broad Spectrum Antibiotics should be administered within 1 hour of Sepsis diagnosis**  Time Code Sepsis Called/Page Received: 1840  Antibiotics Ordered: ceftriaxone  Time of 1st antibiotic administration: 1844  Additional action taken by pharmacy: none required  If necessary, Name of Provider/Nurse Contacted: N/A   Dallie Piles ,PharmD Clinical Pharmacist  11/23/2019  6:47 PM

## 2019-11-23 NOTE — H&P (Addendum)
[name] at [place] NAME: [name]    MR#:  [id number]  DATE OF BIRTH:  07/27/1956  DATE OF ADMISSION:  11/23/2019  PRIMARY CARE PHYSICIAN: [name], NP   REQUESTING/REFERRING PHYSICIAN: Nance Pear, MD/Isaacs, Lysbeth Galas, MD CHIEF COMPLAINT:   Chief Complaint  Patient presents with  . Dizziness    HISTORY OF PRESENT ILLNESS:  [name]  is a 64 y.o. Caucasian female with a known history of multiple medical problems that are mentioned below, including HTN, pAFib, sick sinus syndrome, DM, recurrent UTIs, h/o diverticulitis and C. Diff colitis and h/o intussusception, who presented to the emergency room with acute onset of generalized weakness and dizziness with associated urinary frequency and bilateral flank pain with no dysuria or oliguria or hematuria.  She admits to nausea and vomiting with diarrhea with only one loose bowel movement..  She denies any fever or chills.  No paresthesias or focal muscle weakness.  Upon presentation to the emergency room, blood pressure was 111/70 and later 162/86 with a heart rate of 112 and respiratory to 13 and later 24 with normal pulse oximetry.  Labs revealed hyperglycemia of 404 and a creatinine of 1.32 with BUN of 17 above previous levels in January.  Lactic acid was 2.3 and CBC showed anemia close to baseline.  UA was positive for UTI.  Blood and urine culture were drawn chest x-ray showed low lung volumes with bronchovascular crowding versus bronchial thickening. EKG showed sinus tachycardia with rate of 111 with Q waves in V1 and lead III.  The patient was given 500 mill IV lactated Ringer bolus, 2 L normal saline bolus and a gram of IV Rocephin.  She will be admitted to medical monitored bed for further evaluation and management. PAST MEDICAL HISTORY:   Past Medical History:  Diagnosis Date  . Acute encephalopathy 05/22/2018  . Allergy   . Anxiety   . Ascites   . C. difficile colitis  07/10/2015  . Cancer (HCC)    HX OF CANCER OF UTERUS   . Cirrhosis of liver not due to alcohol ([place]) 2016  . Degenerative disk disease   . Diverticulitis   . Gastroparesis   . GERD (gastroesophageal reflux disease)   . History of hiatal hernia   . Hypertension   . Hypothyroid   . Hypothyroidism due to amiodarone   . Ileus ([place]) 08/01/2015  . Intussusception intestine ([place]) 05/2015  . Orthostatic hypotension   . PAF (paroxysmal atrial fibrillation) ([place]) 03/2015   a. new onset 03/2015 in setting of intractable N/V; b. on Eliquis 5 mg bid; c. CHADSVASc 4 (DM, TIA x 2, female)  . Pancreatitis   . Pneumonia 11/14/2015  . Right ureteral stone 07/14/2016  . Sepsis ([place]) 12/15/2017  . Sick sinus syndrome ([place])   . Stomach ulcer   . Stroke [facility])    with minimal left sided weakness  . Syncope 01/2015  . Syncope due to orthostatic hypotension 05/18/2015  . Tachyarrhythmia 01/10/2016  . TIA (transient ischemic attack) 02/2015  . Type 1 diabetes ([place])    on levemir  . UTERINE CANCER, HX OF 03/27/2007   Qualifier: Diagnosis of  By: Maxie Better FNP, Rosalita Levan   . UTI (urinary tract infection) 05/22/2018    PAST SURGICAL HISTORY:   Past Surgical History:  Procedure Laterality Date  . ABDOMINAL HYSTERECTOMY    . CARDIAC CATHETERIZATION N/A 01/12/2016   Procedure: Left Heart Cath and Coronary Angiography;  Surgeon: Mertie Clause  Fletcher Anon, MD;  Location: [place] CV LAB;  Service: Cardiovascular;  Laterality: N/A;  . CHOLECYSTECTOMY    . CYSTOSCOPY/URETEROSCOPY/HOLMIUM LASER Right 07/14/2016   Procedure: CYSTOSCOPY/URETEROSCOPY/HOLMIUM LASER;  Surgeon: Alexis Frock, MD;  Location: [facility] ORS;  Service: Urology;  Laterality: Right;  . ESOPHAGOGASTRODUODENOSCOPY N/A 04/04/2015   Procedure: ESOPHAGOGASTRODUODENOSCOPY (EGD);  Surgeon: Hulen Luster, MD;  Location: [facility] ENDOSCOPY;  Service: Endoscopy;  Laterality: N/A;  . ESOPHAGOGASTRODUODENOSCOPY N/A 12/28/2017   Procedure: ESOPHAGOGASTRODUODENOSCOPY (EGD);   Surgeon: Lin Landsman, MD;  Location: [facility] ENDOSCOPY;  Service: Gastroenterology;  Laterality: N/A;  . ESOPHAGOGASTRODUODENOSCOPY (EGD) WITH PROPOFOL N/A 01/18/2016   Procedure: ESOPHAGOGASTRODUODENOSCOPY (EGD) WITH PROPOFOL;  Surgeon: Lucilla Lame, MD;  Location: [facility] ENDOSCOPY;  Service: Endoscopy;  Laterality: N/A;  . FLEXIBLE SIGMOIDOSCOPY N/A 01/18/2016   Procedure: FLEXIBLE SIGMOIDOSCOPY;  Surgeon: Lucilla Lame, MD;  Location: [facility] ENDOSCOPY;  Service: Endoscopy;  Laterality: N/A;  . HERNIA REPAIR      SOCIAL HISTORY:   Social History   Tobacco Use  . Smoking status: Former Smoker    Types: Cigarettes  . Smokeless tobacco: Never Used  . Tobacco comment: 25 years ago and only smoked occasionally  Substance Use Topics  . Alcohol use: No    FAMILY HISTORY:   Family History  Problem Relation Age of Onset  . Hypertension Mother   . CAD Sister   . Heart attack Sister        Deceased 2014/12/02  . CAD Brother     DRUG ALLERGIES:   Allergies  Allergen Reactions  . Aspirin Rash  . Codeine Sulfate Rash  . Erythromycin Rash    Reaction:  Fever   . Prednisone Swelling  . Rosiglitazone Maleate Swelling  . Tetanus-Diphtheria Toxoids Td Rash and Other (See Comments)    Reaction:  Fever     REVIEW OF SYSTEMS:   ROS As per history of present illness. All pertinent systems were reviewed above. Constitutional,  HEENT, cardiovascular, respiratory, GI, GU, musculoskeletal, neuro, psychiatric, endocrine,  integumentary and hematologic systems were reviewed and are otherwise  negative/unremarkable except for positive findings mentioned above in the HPI.   MEDICATIONS AT HOME:   Prior to Admission medications   Medication Sig Start Date End Date Taking? Authorizing Provider  albuterol (PROVENTIL HFA;VENTOLIN HFA) 108 (90 Base) MCG/ACT inhaler Inhale 2 puffs into the lungs every 6 (six) hours as needed for wheezing or shortness of breath. 11/27/18  Yes Epifanio Lesches, MD    atorvastatin (LIPITOR) 40 MG tablet Take 40 mg by mouth daily.   Yes [provider]  cholecalciferol (VITAMIN D3) 25 MCG (1000 UNIT) tablet Take 1,000 Units by mouth daily.   Yes [provider]  cyclobenzaprine (FLEXERIL) 10 MG tablet Take 1 tablet (10 mg total) by mouth 3 (three) times daily as needed for muscle spasms. Must last 30 days. 10/11/19 04/08/20 Yes Milinda Pointer, MD  glucose blood (ACCU-CHEK AVIVA PLUS) test strip Use as instructed to test blood sugar up to 4 times daily 10/09/19  Yes [name], NP  Insulin Detemir (LEVEMIR) 100 UNIT/ML Pen Inject 30 Units into the skin 2 (two) times daily. Patient taking differently: Inject 30-34 Units into the skin See admin instructions. Inject 34u under the skin every morning and inject 30u under the skin every night 10/03/19  Yes Enzo Bi, MD  magnesium gluconate (MAGONATE) 500 MG tablet Take 500 mg by mouth 2 (two) times daily.   Yes [provider]  nitroGLYCERIN (NITROSTAT) 0.3 MG SL tablet  Place 1 tablet (0.3 mg total) under the tongue every 5 (five) minutes as needed for chest pain. 07/24/19 07/23/20 Yes Nena Polio, MD  ondansetron (ZOFRAN ODT) 4 MG disintegrating tablet Take 1 tablet (4 mg total) by mouth every 8 (eight) hours as needed for nausea or vomiting. 08/26/19  Yes Carrie Mew, MD  oxyCODONE (OXY IR/ROXICODONE) 5 MG immediate release tablet Take 1 tablet (5 mg total) by mouth every 4 (four) hours as needed for severe pain. Must last 30 days. Max: 6/day 11/10/19 12/10/19 Yes Milinda Pointer, MD  sertraline (ZOLOFT) 50 MG tablet Take 1 tablet (50 mg total) by mouth daily. For anxiety. 10/20/19  Yes [name], NP  midodrine (PROAMATINE) 5 MG tablet Hold this medication until outpatient PCP followup because your blood pressure has been elevated during hospitalization. Patient not taking: Reported on 10/20/2019 10/03/19   Enzo Bi, MD  oxyCODONE (OXY IR/ROXICODONE) 5 MG immediate  release tablet Take 1 tablet (5 mg total) by mouth every 4 (four) hours as needed for severe pain. Must last 30 days. Max: 6/day 10/11/19 11/10/19  Milinda Pointer, MD  oxyCODONE (OXY IR/ROXICODONE) 5 MG immediate release tablet Take 1 tablet (5 mg total) by mouth every 4 (four) hours as needed for severe pain. Must last 30 days. Max: 6/day 12/10/19 01/09/20  Milinda Pointer, MD      VITAL SIGNS:  Blood pressure (!) 153/88, pulse 83, temperature 98.8 F (37.1 C), temperature source Oral, resp. rate (!) 22, height 5' 5"  (1.651 m), weight 81.6 kg, SpO2 98 %.  PHYSICAL EXAMINATION:  Physical Exam  GENERAL:  64 y.o.-year-old Caucasian female patient lying in the bed with no acute distress.  EYES: Pupils equal, round, reactive to light and accommodation. No scleral icterus. Extraocular muscles intact.  HEENT: Head atraumatic, normocephalic. Oropharynx and nasopharynx clear.  NECK:  Supple, no jugular venous distention. No thyroid enlargement, no tenderness.  LUNGS: Normal breath sounds bilaterally, no wheezing, rales,rhonchi or crepitation. No use of accessory muscles of respiration.  CARDIOVASCULAR: Regular rate and rhythm, S1, S2 normal. No murmurs, rubs, or gallops.  ABDOMEN: Soft, nondistended, nontender. Bowel sounds present. No organomegaly or mass.  EXTREMITIES: No pedal edema, cyanosis, or clubbing.  NEUROLOGIC: Cranial nerves II through XII are intact. Muscle strength 5/5 in all extremities. Sensation intact. Gait not checked.  PSYCHIATRIC: The patient is alert and oriented x 3.  Normal affect and good eye contact. SKIN: No obvious rash, lesion, or ulcer.   LABORATORY PANEL:   CBC Recent Labs  Lab 11/23/19 1754  WBC 4.2  HGB 9.4*  HCT 30.2*  PLT 145*   ------------------------------------------------------------------------------------------------------------------  Chemistries  Recent Labs  Lab 11/23/19 1754  NA 135  K 3.8  CL 106  CO2 20*  GLUCOSE 404*  BUN 17   CREATININE 1.32*  CALCIUM 8.0*  AST 21  ALT 22  ALKPHOS 117  BILITOT 0.5   ------------------------------------------------------------------------------------------------------------------  Cardiac Enzymes No results for input(s): TROPONINI in the last 168 hours. ------------------------------------------------------------------------------------------------------------------  RADIOLOGY:  CT ABDOMEN PELVIS W CONTRAST  Result Date: 11/23/2019 CLINICAL DATA:  Nausea, vomiting, diarrhea for 3 days. Fever. Diverticulitis suspected. EXAM: CT ABDOMEN AND PELVIS WITH CONTRAST TECHNIQUE: Multidetector CT imaging of the abdomen and pelvis was performed using the standard protocol following bolus administration of intravenous contrast. CONTRAST:  80m OMNIPAQUE IOHEXOL 300 MG/ML  SOLN COMPARISON:  Abdominal CT 09/30/2019 FINDINGS: Lower chest: Minor atelectasis in the lung bases. No pleural fluid. Hepatobiliary: Cirrhotic hepatic morphology with nodular contours. No  focal lesion. Postcholecystectomy without biliary dilatation. Pancreas: Parenchymal atrophy. No ductal dilatation or inflammation. Spleen: Splenomegaly with spleen measuring 14.5 AP. Adrenals/Urinary Tract: Right hydroureteronephrosis appears chronic and similar to prior exam. Suggestion of proximal ureteric enhancement. There is mild right perinephric edema. Mild right renal parenchymal thinning. No left hydronephrosis or hydroureter. Punctate nonobstructing stone in the mid left kidney. There is delayed excretion on delayed phase imaging from the right kidney. Urinary bladder is partially distended. There is wall thickening about the right dome in left bladder base. Mild perivesicular fat stranding. Stomach/Bowel: Varices about the distal esophagus and gastroesophageal junction. Ingested material in the stomach. Normal positioning of the ligament of Treitz. No small bowel obstruction or inflammation. No terminal ileal inflammation. Appendix  tentatively visualized draped posterior to the cecum. There is no evidence of appendicitis. Colonic tortuosity with small to moderate stool burden. No significant diverticular disease. No diverticulitis. Vascular/Lymphatic: Aortic atherosclerosis without aneurysm. The portal vein is patent. Varices in the left upper quadrant. Paraesophageal varices extend to the gastroesophageal junction. Multiple small retroperitoneal and periportal lymph nodes are likely reactive. Reproductive: Status post hysterectomy. No adnexal masses. Other: Trace fluid in the right pericolic gutter. No free air. No intra-abdominal abscess. Musculoskeletal: There are no acute or suspicious osseous abnormalities. Scoliotic curvature of spine. IMPRESSION: 1. Bladder wall thickening with perivesicular fat stranding. Right hydronephrosis and proximal hydroureter with ureteral thickening and perinephric edema. Findings are suspicious for cystitis and ascending right urinary tract infection, however similar findings were also seen on prior CT in January. Recommend correlation with urinalysis. 2. Cirrhotic hepatic morphology with portal hypertension. Splenomegaly and paraesophageal and left upper quadrant varices. 3. Punctate nonobstructing stone in the left kidney. 4.  Aortic Atherosclerosis (ICD10-I70.0). Aortic Atherosclerosis (ICD10-I70.0). Electronically Signed   By: Keith Rake M.D.   On: 11/23/2019 20:59   DG Chest Portable 1 View  Result Date: 11/23/2019 CLINICAL DATA:  Cough. Dizziness. Fever. EXAM: PORTABLE CHEST 1 VIEW COMPARISON:  Radiograph 09/30/2019 FINDINGS: Low lung volumes. Patient is rotated. Borderline cardiomegaly, likely accentuated by technique. Aortic atherosclerosis. Bronchovascular crowding versus bronchial thickening. No confluent airspace disease. No pleural effusion or pneumothorax. No acute osseous abnormalities are seen. IMPRESSION: Low lung volumes. Bronchovascular crowding versus bronchial thickening.  Electronically Signed   By: Keith Rake M.D.   On: 11/23/2019 19:02      IMPRESSION AND PLAN:   1.  Sepsis without severe sepsis or septic shock. -This likely secondary to UTI. -The patient will be placed on IV Rocephin. -We will follow blood and urine cultures. -She will be hydrated with IV normal saline.  2.  UTI. -This is likely the culprit for #1. -Management as above.  3.  Uncontrolled type 2 diabetes mellitus. -The patient will be placed on supplement coverage with NovoLog with frequent fingerstick blood glucose measures. -We will continue basal coverage.  4.  Mild acute kidney injury. -This likely prerenal due to volume depletion and mild dehydration. -The patient will be hydrated with IV normal saline and will follow BMPs.  5.  Dyslipidemia. -Statin therapy will be resumed.  6.  Depression. -Zoloft will be resumed.  7.  DVT prophylaxis. -Subtenons Lovenox.   All the records are reviewed and case discussed with ED provider. The plan of care was discussed in details with the patient (and family). I answered all questions. The patient agreed to proceed with the above mentioned plan. Further management will depend upon hospital course.   CODE STATUS: Patient is DNR.  TOTAL TIME TAKING CARE  OF THIS PATIENT: 55 minutes.    Christel Mormon M.D on 11/23/2019 at 11:46 PM  [facility]   From 7 PM-7 AM, contact night-coverage [email or web address]  CC: Primary care physician; [name], NP   Note: This dictation was prepared with Dragon dictation along with smaller phrase technology. Any transcriptional errors that result from this process are unintentional.

## 2019-11-23 NOTE — Progress Notes (Signed)
CODE SEPSIS - PHARMACY COMMUNICATION  **Broad Spectrum Antibiotics should be administered within 1 hour of Sepsis diagnosis**  Time Code Sepsis Called/Page Received: 2239  Antibiotics Ordered: Rocephin  Time of 1st antibiotic administration: already given at 1844  Additional action taken by pharmacy: n/a   If necessary, Name of Provider/Nurse Contacted: n/a    Ena Dawley ,PharmD Clinical Pharmacist  11/23/2019  11:21 PM

## 2019-11-23 NOTE — Telephone Encounter (Signed)
Noted and agree. I am happy to see her in the office this week if needed.

## 2019-11-23 NOTE — ED Notes (Signed)
Dr. Ellender Hose ok with one set of blood cultures being drawn

## 2019-11-24 ENCOUNTER — Other Ambulatory Visit: Payer: Self-pay

## 2019-11-24 DIAGNOSIS — A419 Sepsis, unspecified organism: Secondary | ICD-10-CM

## 2019-11-24 LAB — BASIC METABOLIC PANEL
Anion gap: 7 (ref 5–15)
BUN: 17 mg/dL (ref 8–23)
CO2: 24 mmol/L (ref 22–32)
Calcium: 7.9 mg/dL — ABNORMAL LOW (ref 8.9–10.3)
Chloride: 108 mmol/L (ref 98–111)
Creatinine, Ser: 1.01 mg/dL — ABNORMAL HIGH (ref 0.44–1.00)
GFR calc Af Amer: >60 mL/min (ref >60)
GFR calc non Af Amer: 58 mL/min — ABNORMAL LOW (ref >60)
Glucose, Bld: 119 mg/dL — ABNORMAL HIGH (ref 70–99)
Potassium: 4 mmol/L (ref 3.5–5.1)
Sodium: 139 mmol/L (ref 135–145)

## 2019-11-24 LAB — PROTIME-INR
INR: 1.1 (ref 0.8–1.2)
Prothrombin Time: 14.5 seconds (ref 11.4–15.2)

## 2019-11-24 LAB — SARS CORONAVIRUS 2 (TAT 6-24 HRS): SARS Coronavirus 2: NEGATIVE

## 2019-11-24 LAB — GLUCOSE, CAPILLARY
Glucose-Capillary: 76 mg/dL (ref 70–99)
Glucose-Capillary: 147 mg/dL — ABNORMAL HIGH (ref 70–99)
Glucose-Capillary: 177 mg/dL — ABNORMAL HIGH (ref 70–99)
Glucose-Capillary: 190 mg/dL — ABNORMAL HIGH (ref 70–99)
Glucose-Capillary: 156 mg/dL — ABNORMAL HIGH (ref 70–99)

## 2019-11-24 LAB — INFLUENZA PANEL BY PCR (TYPE A & B)
Influenza A By PCR: NEGATIVE
Influenza B By PCR: NEGATIVE

## 2019-11-24 LAB — URINE CULTURE: Culture: <10000 — AB

## 2019-11-24 LAB — FIBRIN DERIVATIVES D-DIMER (ARMC ONLY): Fibrin derivatives D-dimer (ARMC): 419.94 ng/mL (FEU) (ref 0.00–499.00)

## 2019-11-24 LAB — LACTIC ACID, PLASMA: Lactic Acid, Venous: 0.8 mmol/L (ref 0.5–1.9)

## 2019-11-24 LAB — CORTISOL-AM, BLOOD: Cortisol - AM: 13.8 ug/dL (ref 6.7–22.6)

## 2019-11-24 LAB — PROCALCITONIN: Procalcitonin: <0.1 ng/mL

## 2019-11-24 LAB — HEMOGLOBIN A1C
Hgb A1c MFr Bld: 10.4 % — ABNORMAL HIGH (ref 4.8–5.6)
Mean Plasma Glucose: 251.78 mg/dL

## 2019-11-24 LAB — TROPONIN I (HIGH SENSITIVITY): Troponin I (High Sensitivity): 8 ng/L (ref <18)

## 2019-11-24 MED ORDER — INSULIN ASPART 100 UNIT/ML ~~LOC~~ SOLN
0.0000 [IU] | SUBCUTANEOUS | Status: DC
  Administered 2019-11-24: 4 [IU] via SUBCUTANEOUS
  Administered 2019-11-24: 3 [IU] via SUBCUTANEOUS
  Filled 2019-11-24 (×2): qty 1

## 2019-11-24 MED ORDER — INSULIN DETEMIR 100 UNIT/ML ~~LOC~~ SOLN
30.0000 [IU] | Freq: Every day | SUBCUTANEOUS | Status: DC
  Administered 2019-11-24 – 2019-11-26 (×3): 30 [IU] via SUBCUTANEOUS
  Filled 2019-11-24 (×4): qty 0.3

## 2019-11-24 MED ORDER — ACETAMINOPHEN 650 MG RE SUPP: 650.0000 mg | Freq: Four times a day (QID) | RECTAL | Status: DC | PRN

## 2019-11-24 MED ORDER — LIVING WELL WITH DIABETES BOOK
Freq: Once | Status: AC
  Filled 2019-11-24: qty 1

## 2019-11-24 MED ORDER — NITROGLYCERIN 0.4 MG SL SUBL
0.4000 mg | SUBLINGUAL_TABLET | SUBLINGUAL | Status: DC | PRN
  Administered 2019-11-25: 0.4 mg via SUBLINGUAL

## 2019-11-24 MED ORDER — MENTHOL 3 MG MT LOZG: 1.0000 | LOZENGE | OROMUCOSAL | Status: DC | PRN

## 2019-11-24 MED ORDER — ACETAMINOPHEN 325 MG PO TABS: 650.0000 mg | ORAL_TABLET | Freq: Four times a day (QID) | ORAL | Status: DC | PRN

## 2019-11-24 MED ORDER — INSULIN DETEMIR 100 UNIT/ML ~~LOC~~ SOLN
34.0000 [IU] | Freq: Every day | SUBCUTANEOUS | Status: DC
  Administered 2019-11-24 – 2019-11-27 (×4): 34 [IU] via SUBCUTANEOUS
  Filled 2019-11-24 (×4): qty 0.34

## 2019-11-24 MED ORDER — NITROGLYCERIN 0.4 MG SL SUBL
SUBLINGUAL_TABLET | SUBLINGUAL | Status: AC
  Administered 2019-11-24: 0.4 mg via SUBLINGUAL
  Filled 2019-11-24: qty 1

## 2019-11-24 MED ORDER — INSULIN ASPART 100 UNIT/ML ~~LOC~~ SOLN
0.0000 [IU] | Freq: Every day | SUBCUTANEOUS | Status: DC
  Administered 2019-11-25: 4 [IU] via SUBCUTANEOUS
  Administered 2019-11-26: 2 [IU] via SUBCUTANEOUS
  Filled 2019-11-24 (×2): qty 1

## 2019-11-24 MED ORDER — SODIUM CHLORIDE 0.9 % IV SOLN
2.0000 g | INTRAVENOUS | Status: DC
  Administered 2019-11-24 – 2019-11-26 (×3): 2 g via INTRAVENOUS
  Filled 2019-11-24 (×2): qty 2
  Filled 2019-11-24: qty 20
  Filled 2019-11-24: qty 2

## 2019-11-24 MED ORDER — SODIUM CHLORIDE 0.9 % IV SOLN: INTRAVENOUS | Status: DC

## 2019-11-24 MED ORDER — INSULIN ASPART 100 UNIT/ML ~~LOC~~ SOLN
0.0000 [IU] | Freq: Three times a day (TID) | SUBCUTANEOUS | Status: DC
  Administered 2019-11-24 – 2019-11-25 (×2): 2 [IU] via SUBCUTANEOUS
  Administered 2019-11-25: 1 [IU] via SUBCUTANEOUS
  Administered 2019-11-25: 2 [IU] via SUBCUTANEOUS
  Administered 2019-11-26 (×3): 3 [IU] via SUBCUTANEOUS
  Administered 2019-11-27: 5 [IU] via SUBCUTANEOUS
  Administered 2019-11-27: 2 [IU] via SUBCUTANEOUS
  Administered 2019-11-27: 7 [IU] via SUBCUTANEOUS
  Filled 2019-11-24 (×10): qty 1

## 2019-11-24 NOTE — Progress Notes (Signed)
Inpatient Diabetes Program Recommendations  AACE/ADA: New Consensus Statement on Inpatient Glycemic Control (2015)  Target Ranges:  Prepandial:   less than 140 mg/dL      Peak postprandial:   less than 180 mg/dL (1-2 hours)      Critically ill patients:  140 - 180 mg/dL   Lab Results  Component Value Date   GLUCAP 177 (H) 11/24/2019   HGBA1C 10.4 (H) 11/24/2019    Review of Glycemic Control Results for [name], [name] (MRN [id number]) as of 11/24/2019 13:06  Ref. Range 11/24/2019 05:14 11/24/2019 07:19 11/24/2019 11:46  Glucose-Capillary Latest Ref Range: 70 - 99 mg/dL 76 147 (H) 177 (H)   Diabetes history: DM2 Outpatient Diabetes medications: Levemir 34 units am + 30 units pm Current orders for Inpatient glycemic control: Levemir 34 units am + 30 units pm + Novolog resistant correction q 4 hrs.   Inpatient Diabetes Program Recommendations:   -Decrease Novolog correction to sensitive tid + hs 0-5 units Consider oral medication such as small amount Amaryl on discharge to assist with meal coverage.  A1c has decreased from 11.6 pm 08/18/19 to currently 10..4. Spoke with patient regarding diabetes management. Patient states she did not take any of her insulin when she was sick and did not know to check her CBGs more often. Reviewed sick day rules with patient and reviewed with her to speak with her PCP regarding portion of insulin to take when she is sick and unable to eat. Patient verbalized understanding to check her CBGs q 2 hrs and prn when she is sick and unable to eat and discuss plan with PCP of how much Levemir to take during sick day times. Also ordered Living Well with diabetes book.  Thank you, Nani Gasser. Jeffre Enriques, RN, MSN, CDE  Diabetes Coordinator Inpatient Glycemic Control Team Team Pager [phone] (8am-5pm) 11/24/2019 1:24 PM

## 2019-11-24 NOTE — Progress Notes (Signed)
PROGRESS NOTE    [name]  HER:[id number] DOB: July 09, 1956 DOA: 11/23/2019  PCP: [name], NP    LOS - 1   Brief Narrative:  [name]  is a 64 y.o. female with an extensive past medical  history including HTN, pAFib, sick sinus syndrome, DM, recurrent UTIs, h/o diverticulitis and C. Diff colitis and h/o intussusception, who presented to the ED on 3/15 with urinary frequency, bilateral flank pain, generalized weakness, nausea/vomiting, single episode diarrhea and dizziness.  No dysuria, oliguria or hematuria.  Initial BP 111/70 later 162/86, intermittently tachypneic.  Blood glucose 404, lactic acid 2.3, CBC showed stable anemia.  UA consistent with UTI.  Blood and urine cultures obtained.  Chest x-ray showed low lung volumes with bronchovascular crowding versus bronchial thickening.  EKG was sinus tachycardia with heart rate 111 Q waves in V1 and III. Treated in the ED with Rocephin and IV fluids indicated for further evaluation management.  Subjective 3/16: Patient seen this morning.  No acute events reported overnight.  She states feeling worse this morning.  Has been sore throat, this is new.  Feels short of breath, has back and lower abdominal pain.  Assessment & Plan:   Active Problems:   Sepsis ([place])   Sepsis secondary to UTI -without endorgan damage or septic shock.  Sepsis present on admission tachycardia, tachypnea, UA consistent with infection. --Continue Rocephin --Follow-up blood and urine cultures --Follow-up repeat lactic acid --Continue IV hydration, stop this afternoon if patient eating and drinking  Uncontrolled type 2 diabetes mellitus -A1c 10.4% Home regimen: Levemir 30 units twice daily --Continue home Levemir --Sliding scale NovoLog  Mild acute kidney injury -likely prerenal with dehydration  --Expect improvement with IV hydration --Follow BMP  Chronic back pain Degenerative disc disease Myofascial pain syndrome --Continue home  oxycodone and Flexeril  Dyslipidemia -continue Lipitor  Depression -continue Zoloft    DVT prophylaxis: Lovenox   Code Status: DNR  Family Communication: None at bedside during encounter  Disposition Plan: Expect home pending clinical improvement Coming From home Exp DC Date 3/18 Barriers as above Medically Stable for Discharge?  No  Consultants:   None  Procedures:   None  Antimicrobials:   Rocephin 3/15 >>   Objective: Vitals:   11/24/19 1804 11/24/19 1805 11/24/19 1806 11/24/19 2037  BP:    (!) 160/87  Pulse:    (!) 116  Resp: 18 (!) 25 18   Temp:    98.2 F (36.8 C)  TempSrc:    Oral  SpO2:    99%  Weight:      Height:        Intake/Output Summary (Last 24 hours) at 11/24/2019 2059 Last data filed at 11/24/2019 1845 Gross per 24 hour  Intake 3226.26 ml  Output 1150 ml  Net 2076.26 ml   Filed Weights   11/23/19 1725 11/24/19 0050  Weight: 81.6 kg 81.5 kg    Examination:  General exam: awake, alert, no acute distress, mildly ill-appearing HEENT: Conjunctival injection, anicteric sclera, moist mucus membranes, hearing grossly normal  Respiratory system: CTAB, no wheezes, rales or rhonchi, slightly increased respiratory effort. Cardiovascular system: normal S1/S2, tachycardic, no JVD, murmurs, rubs, gallops, no pedal edema.   Gastrointestinal system: soft, lower abdomen tender, nondistended, no HSM felt, +bowel sounds. Central nervous system: A&O x4. no gross focal neurologic deficits, normal speech Extremities: moves all, no edema, normal tone Skin: dry, intact, warm Psychiatry: normal mood, congruent affect, judgement and insight appear normal    Data  Reviewed: I have personally reviewed following labs and imaging studies  CBC: Recent Labs  Lab 11/23/19 1754  WBC 4.2  NEUTROABS 3.1  HGB 9.4*  HCT 30.2*  MCV 82.3  PLT 170*   Basic Metabolic Panel: Recent Labs  Lab 11/23/19 1754 11/24/19 0620  NA 135 139  K 3.8 4.0  CL 106 108    CO2 20* 24  GLUCOSE 404* 119*  BUN 17 17  CREATININE 1.32* 1.01*  CALCIUM 8.0* 7.9*   GFR: Estimated Creatinine Clearance: 56.1 mL/min (A) (by C-G formula based on SCr of 1.01 mg/dL (H)). Liver Function Tests: Recent Labs  Lab 11/23/19 1754  AST 21  ALT 22  ALKPHOS 117  BILITOT 0.5  PROT 6.3*  ALBUMIN 3.1*   No results for input(s): LIPASE, AMYLASE in the last 168 hours. No results for input(s): AMMONIA in the last 168 hours. Coagulation Profile: Recent Labs  Lab 11/24/19 0620  INR 1.1   Cardiac Enzymes: No results for input(s): CKTOTAL, CKMB, CKMBINDEX, TROPONINI in the last 168 hours. BNP (last 3 results) No results for input(s): PROBNP in the last 8760 hours. HbA1C: Recent Labs    11/24/19 0620  HGBA1C 10.4*   CBG: Recent Labs  Lab 11/24/19 0514 11/24/19 0719 11/24/19 1146 11/24/19 1716  GLUCAP 76 147* 177* 190*   Lipid Profile: No results for input(s): CHOL, HDL, LDLCALC, TRIG, CHOLHDL, LDLDIRECT in the last 72 hours. Thyroid Function Tests: No results for input(s): TSH, T4TOTAL, FREET4, T3FREE, THYROIDAB in the last 72 hours. Anemia Panel: No results for input(s): VITAMINB12, FOLATE, FERRITIN, TIBC, IRON, RETICCTPCT in the last 72 hours. Sepsis Labs: Recent Labs  Lab 11/23/19 1754 11/23/19 2008 11/24/19 0620  PROCALCITON  --   --  <0.10  LATICACIDVEN 2.3* 2.0*  --     Recent Results (from the past 240 hour(s))  Blood culture (routine x 2)     Status: None (Preliminary result)   Collection Time: 11/23/19  5:55 PM   Specimen: BLOOD  Result Value Ref Range Status   Specimen Description BLOOD LEFT ANTECUBITAL  Final   Special Requests   Final    BOTTLES DRAWN AEROBIC AND ANAEROBIC Blood Culture adequate volume   Culture   Final    NO GROWTH < 24 HOURS Performed at [facility], [address] [place], [place] [address]    Report Status PENDING  Incomplete  SARS CORONAVIRUS 2 (TAT 6-24 HRS) Nasopharyngeal Nasopharyngeal Swab      Status: None   Collection Time: 11/24/19  8:56 AM   Specimen: Nasopharyngeal Swab  Result Value Ref Range Status   SARS Coronavirus 2 NEGATIVE NEGATIVE Final    Comment: (NOTE) SARS-CoV-2 target nucleic acids are NOT DETECTED. The SARS-CoV-2 RNA is generally detectable in upper and lower respiratory specimens during the acute phase of infection. Negative results do not preclude SARS-CoV-2 infection, do not rule out co-infections with other pathogens, and should not be used as the sole basis for treatment or other patient management decisions. Negative results must be combined with clinical observations, patient history, and epidemiological information. The expected result is Negative. Fact Sheet for Patients: SugarRoll.be Fact Sheet for Healthcare Providers: https://www.woods-mathews.com/ This test is not yet approved or cleared by the [place] FDA and  has been authorized for detection and/or diagnosis of SARS-CoV-2 by FDA under an Emergency Use Authorization (EUA). This EUA will remain  in effect (meaning this test can be used) for the duration of the COVID-19 declaration under Section 56  4(b)(1) of the Act, 21 U.S.C. section 360bbb-3(b)(1), unless the authorization is terminated or revoked sooner. Performed at [facility], [place] [address], [place], [place] [address]          Radiology Studies: CT ABDOMEN PELVIS W CONTRAST  Result Date: 11/23/2019 CLINICAL DATA:  Nausea, vomiting, diarrhea for 3 days. Fever. Diverticulitis suspected. EXAM: CT ABDOMEN AND PELVIS WITH CONTRAST TECHNIQUE: Multidetector CT imaging of the abdomen and pelvis was performed using the standard protocol following bolus administration of intravenous contrast. CONTRAST:  61m OMNIPAQUE IOHEXOL 300 MG/ML  SOLN COMPARISON:  Abdominal CT 09/30/2019 FINDINGS: Lower chest: Minor atelectasis in the lung bases. No pleural fluid. Hepatobiliary: Cirrhotic hepatic  morphology with nodular contours. No focal lesion. Postcholecystectomy without biliary dilatation. Pancreas: Parenchymal atrophy. No ductal dilatation or inflammation. Spleen: Splenomegaly with spleen measuring 14.5 AP. Adrenals/Urinary Tract: Right hydroureteronephrosis appears chronic and similar to prior exam. Suggestion of proximal ureteric enhancement. There is mild right perinephric edema. Mild right renal parenchymal thinning. No left hydronephrosis or hydroureter. Punctate nonobstructing stone in the mid left kidney. There is delayed excretion on delayed phase imaging from the right kidney. Urinary bladder is partially distended. There is wall thickening about the right dome in left bladder base. Mild perivesicular fat stranding. Stomach/Bowel: Varices about the distal esophagus and gastroesophageal junction. Ingested material in the stomach. Normal positioning of the ligament of Treitz. No small bowel obstruction or inflammation. No terminal ileal inflammation. Appendix tentatively visualized draped posterior to the cecum. There is no evidence of appendicitis. Colonic tortuosity with small to moderate stool burden. No significant diverticular disease. No diverticulitis. Vascular/Lymphatic: Aortic atherosclerosis without aneurysm. The portal vein is patent. Varices in the left upper quadrant. Paraesophageal varices extend to the gastroesophageal junction. Multiple small retroperitoneal and periportal lymph nodes are likely reactive. Reproductive: Status post hysterectomy. No adnexal masses. Other: Trace fluid in the right pericolic gutter. No free air. No intra-abdominal abscess. Musculoskeletal: There are no acute or suspicious osseous abnormalities. Scoliotic curvature of spine. IMPRESSION: 1. Bladder wall thickening with perivesicular fat stranding. Right hydronephrosis and proximal hydroureter with ureteral thickening and perinephric edema. Findings are suspicious for cystitis and ascending right urinary  tract infection, however similar findings were also seen on prior CT in January. Recommend correlation with urinalysis. 2. Cirrhotic hepatic morphology with portal hypertension. Splenomegaly and paraesophageal and left upper quadrant varices. 3. Punctate nonobstructing stone in the left kidney. 4.  Aortic Atherosclerosis (ICD10-I70.0). Aortic Atherosclerosis (ICD10-I70.0). Electronically Signed   By: MKeith RakeM.D.   On: 11/23/2019 20:59   DG Chest Portable 1 View  Result Date: 11/23/2019 CLINICAL DATA:  Cough. Dizziness. Fever. EXAM: PORTABLE CHEST 1 VIEW COMPARISON:  Radiograph 09/30/2019 FINDINGS: Low lung volumes. Patient is rotated. Borderline cardiomegaly, likely accentuated by technique. Aortic atherosclerosis. Bronchovascular crowding versus bronchial thickening. No confluent airspace disease. No pleural effusion or pneumothorax. No acute osseous abnormalities are seen. IMPRESSION: Low lung volumes. Bronchovascular crowding versus bronchial thickening. Electronically Signed   By: MKeith RakeM.D.   On: 11/23/2019 19:02        Scheduled Meds: . atorvastatin  40 mg Oral Daily  . cholecalciferol  1,000 Units Oral Daily  . enoxaparin (LOVENOX) injection  40 mg Subcutaneous Q24H  . insulin aspart  0-5 Units Subcutaneous QHS  . insulin aspart  0-9 Units Subcutaneous TID WC  . insulin detemir  30 Units Subcutaneous QHS  . insulin detemir  34 Units Subcutaneous QPC breakfast  . magnesium oxide  400 mg Oral  BID  . sertraline  50 mg Oral Daily   Continuous Infusions: . cefTRIAXone (ROCEPHIN)  IV 2 g (11/24/19 1743)     LOS: 1 day    Time spent: 40 minutes    Ezekiel Slocumb, DO [facility]   If 7PM-7AM, please contact night-coverage [email or web address] 11/24/2019, 8:59 PM

## 2019-11-25 DIAGNOSIS — N390 Urinary tract infection, site not specified: Secondary | ICD-10-CM

## 2019-11-25 LAB — CBC WITH DIFFERENTIAL/PLATELET
Abs Immature Granulocytes: 0.01 10*3/uL (ref 0.00–0.07)
Basophils Absolute: 0 10*3/uL (ref 0.0–0.1)
Basophils Relative: 0 %
Eosinophils Absolute: 0.1 10*3/uL (ref 0.0–0.5)
Eosinophils Relative: 2 %
HCT: 31.7 % — ABNORMAL LOW (ref 36.0–46.0)
Hemoglobin: 9.7 g/dL — ABNORMAL LOW (ref 12.0–15.0)
Immature Granulocytes: 0 %
Lymphocytes Relative: 8 %
Lymphs Abs: 0.4 10*3/uL — ABNORMAL LOW (ref 0.7–4.0)
MCH: 25.2 pg — ABNORMAL LOW (ref 26.0–34.0)
MCHC: 30.6 g/dL (ref 30.0–36.0)
MCV: 82.3 fL (ref 80.0–100.0)
Monocytes Absolute: 0.3 10*3/uL (ref 0.1–1.0)
Monocytes Relative: 6 %
Neutro Abs: 4.7 10*3/uL (ref 1.7–7.7)
Neutrophils Relative %: 84 %
Platelets: 140 10*3/uL — ABNORMAL LOW (ref 150–400)
RBC: 3.85 MIL/uL — ABNORMAL LOW (ref 3.87–5.11)
RDW: 15.9 % — ABNORMAL HIGH (ref 11.5–15.5)
WBC: 5.5 10*3/uL (ref 4.0–10.5)
nRBC: 0 % (ref 0.0–0.2)

## 2019-11-25 LAB — COMPREHENSIVE METABOLIC PANEL
ALT: 24 U/L (ref 0–44)
AST: 34 U/L (ref 15–41)
Albumin: 3 g/dL — ABNORMAL LOW (ref 3.5–5.0)
Alkaline Phosphatase: 120 U/L (ref 38–126)
Anion gap: 5 (ref 5–15)
BUN: 19 mg/dL (ref 8–23)
CO2: 24 mmol/L (ref 22–32)
Calcium: 7.8 mg/dL — ABNORMAL LOW (ref 8.9–10.3)
Chloride: 105 mmol/L (ref 98–111)
Creatinine, Ser: 1.08 mg/dL — ABNORMAL HIGH (ref 0.44–1.00)
GFR calc Af Amer: >60 mL/min (ref >60)
GFR calc non Af Amer: 54 mL/min — ABNORMAL LOW (ref >60)
Glucose, Bld: 231 mg/dL — ABNORMAL HIGH (ref 70–99)
Potassium: 4.3 mmol/L (ref 3.5–5.1)
Sodium: 134 mmol/L — ABNORMAL LOW (ref 135–145)
Total Bilirubin: 0.7 mg/dL (ref 0.3–1.2)
Total Protein: 6.3 g/dL — ABNORMAL LOW (ref 6.5–8.1)

## 2019-11-25 LAB — GLUCOSE, CAPILLARY
Glucose-Capillary: 177 mg/dL — ABNORMAL HIGH (ref 70–99)
Glucose-Capillary: 125 mg/dL — ABNORMAL HIGH (ref 70–99)
Glucose-Capillary: 189 mg/dL — ABNORMAL HIGH (ref 70–99)
Glucose-Capillary: 292 mg/dL — ABNORMAL HIGH (ref 70–99)
Glucose-Capillary: 308 mg/dL — ABNORMAL HIGH (ref 70–99)

## 2019-11-25 LAB — TROPONIN I (HIGH SENSITIVITY): Troponin I (High Sensitivity): 8 ng/L (ref <18)

## 2019-11-25 LAB — MAGNESIUM: Magnesium: 1.9 mg/dL (ref 1.7–2.4)

## 2019-11-25 MED ORDER — PHENAZOPYRIDINE HCL 100 MG PO TABS
100.0000 mg | ORAL_TABLET | Freq: Three times a day (TID) | ORAL | Status: DC
  Administered 2019-11-26 – 2019-11-27 (×6): 100 mg via ORAL
  Filled 2019-11-25 (×9): qty 1

## 2019-11-25 NOTE — Progress Notes (Signed)
Patient had 1 episode of left sided chest pain at a 7/10 described as stabbing. Accompanied mild nausea and dizziness. Vitals sign stable. Relieved by 1 nitro. EKG performed. MD notified. Lab orders placed. No further episodes during shift. Will continue to monitor

## 2019-11-25 NOTE — Progress Notes (Signed)
PROGRESS NOTE    [name]  FVC:[id number] DOB: 11/13/1955 DOA: 11/23/2019  PCP: [name], NP    LOS - 2   Brief Narrative:  [name]  is a 64 y.o. female with an extensive past medical  history including HTN, pAFib, sick sinus syndrome, DM, recurrent UTIs, h/o diverticulitis and C. Diff colitis and h/o intussusception, who presented to the ED on 3/15 with urinary frequency, bilateral flank pain, generalized weakness, nausea/vomiting, single episode diarrhea and dizziness.  No dysuria, oliguria or hematuria.  Initial BP 111/70 later 162/86, intermittently tachypneic.  Blood glucose 404, lactic acid 2.3, CBC showed stable anemia.  UA consistent with UTI.  Blood and urine cultures obtained.  Chest x-ray showed low lung volumes with bronchovascular crowding versus bronchial thickening.  EKG was sinus tachycardia with heart rate 111 Q waves in V1 and III. Treated in the ED with Rocephin and IV fluids indicated for further evaluation management.  Subjective 3/16: Pt complained of dyspnea, abdominal pain, back pain, dysuria that started after presentation.  Also had chest pain.  No fever, N/V/D.   Assessment & Plan:   Active Problems:   Sepsis ([place])   UTI  hematuria Sepsis ruled out --dysuria started after presentation. --Continue Rocephin --urine cx no significant growth --Follow-up repeat lactic acid --Start Pyridium --consult urology tomorrow  Uncontrolled type 2 diabetes mellitus -A1c 10.4% Home regimen: Levemir 30 units twice daily --Continue home Levemir --Sliding scale NovoLog  Mild Cr increase acute kidney injury ruled out --s/p IVF hydration  Chronic back pain Degenerative disc disease Myofascial pain syndrome --Continue home oxycodone and Flexeril  Dyslipidemia -continue Lipitor  Depression -continue Zoloft  Dyspnea, subjective --No hypoxia noted.  Pt asked to be put on suppl O2    DVT prophylaxis: Lovenox   Code Status: DNR  Family  Communication: None at bedside during encounter  Disposition Plan: Expect home pending urology eval PT   Consultants:   None  Procedures:   None  Antimicrobials:   Rocephin 3/15 >>   Objective: Vitals:   11/24/19 2037 11/24/19 2236 11/25/19 0803 11/25/19 1153  BP: (!) 160/87 (!) 150/94 (!) 143/76 138/87  Pulse: (!) 116 (!) 120 (!) 104 (!) 114  Resp:   17 18  Temp: 98.2 F (36.8 C) 98.7 F (37.1 C) 98 F (36.7 C) 99.1 F (37.3 C)  TempSrc: Oral Oral Oral Oral  SpO2: 99% 100% 100% 99%  Weight:  76.2 kg    Height:        Intake/Output Summary (Last 24 hours) at 11/25/2019 1613 Last data filed at 11/25/2019 1100 Gross per 24 hour  Intake 100 ml  Output 1525 ml  Net -1425 ml   Filed Weights   11/23/19 1725 11/24/19 0050 11/24/19 2236  Weight: 81.6 kg 81.5 kg 76.2 kg    Examination:  General exam: awake, alert, no acute distress, mildly ill-appearing HEENT: Conjunctival injection, anicteric sclera, moist mucus membranes, hearing grossly normal  Respiratory system: CTAB, no wheezes, rales or rhonchi, slightly increased respiratory effort. Cardiovascular system: normal S1/S2, tachycardic, no JVD, murmurs, rubs, gallops, no pedal edema.   Gastrointestinal system: soft, lower abdomen tender, nondistended, no HSM felt, +bowel sounds. Central nervous system: A&O x4. no gross focal neurologic deficits, normal speech Extremities: moves all, no edema, normal tone Skin: dry, intact, warm Psychiatry: normal mood, congruent affect, judgement and insight appear normal    Data Reviewed: I have personally reviewed following labs and imaging studies  CBC: Recent Labs  Lab  11/23/19 1754 11/25/19 0324  WBC 4.2 5.5  NEUTROABS 3.1 4.7  HGB 9.4* 9.7*  HCT 30.2* 31.7*  MCV 82.3 82.3  PLT 145* 540*   Basic Metabolic Panel: Recent Labs  Lab 11/23/19 1754 11/24/19 0620 11/25/19 0324  NA 135 139 134*  K 3.8 4.0 4.3  CL 106 108 105  CO2 20* 24 24  GLUCOSE 404* 119*  231*  BUN 17 17 19   CREATININE 1.32* 1.01* 1.08*  CALCIUM 8.0* 7.9* 7.8*  MG  --   --  1.9   GFR: Estimated Creatinine Clearance: 50.7 mL/min (A) (by C-G formula based on SCr of 1.08 mg/dL (H)). Liver Function Tests: Recent Labs  Lab 11/23/19 1754 11/25/19 0324  AST 21 34  ALT 22 24  ALKPHOS 117 120  BILITOT 0.5 0.7  PROT 6.3* 6.3*  ALBUMIN 3.1* 3.0*   No results for input(s): LIPASE, AMYLASE in the last 168 hours. No results for input(s): AMMONIA in the last 168 hours. Coagulation Profile: Recent Labs  Lab 11/24/19 0620  INR 1.1   Cardiac Enzymes: No results for input(s): CKTOTAL, CKMB, CKMBINDEX, TROPONINI in the last 168 hours. BNP (last 3 results) No results for input(s): PROBNP in the last 8760 hours. HbA1C: Recent Labs    11/24/19 0620  HGBA1C 10.4*   CBG: Recent Labs  Lab 11/24/19 1146 11/24/19 1716 11/24/19 2122 11/25/19 0802 11/25/19 1152  GLUCAP 177* 190* 156* 177* 125*   Lipid Profile: No results for input(s): CHOL, HDL, LDLCALC, TRIG, CHOLHDL, LDLDIRECT in the last 72 hours. Thyroid Function Tests: No results for input(s): TSH, T4TOTAL, FREET4, T3FREE, THYROIDAB in the last 72 hours. Anemia Panel: No results for input(s): VITAMINB12, FOLATE, FERRITIN, TIBC, IRON, RETICCTPCT in the last 72 hours. Sepsis Labs: Recent Labs  Lab 11/23/19 1754 11/23/19 2008 11/24/19 0620 11/24/19 2120  PROCALCITON  --   --  <0.10  --   LATICACIDVEN 2.3* 2.0*  --  0.8    Recent Results (from the past 240 hour(s))  Blood culture (routine x 2)     Status: None (Preliminary result)   Collection Time: 11/23/19  5:55 PM   Specimen: BLOOD  Result Value Ref Range Status   Specimen Description BLOOD LEFT ANTECUBITAL  Final   Special Requests   Final    BOTTLES DRAWN AEROBIC AND ANAEROBIC Blood Culture adequate volume   Culture   Final    NO GROWTH 2 DAYS Performed at [facility], [address] [place], [place] [address]    Report Status PENDING   Incomplete  Urine culture     Status: Abnormal   Collection Time: 11/23/19  5:55 PM   Specimen: Urine, Random  Result Value Ref Range Status   Specimen Description   Final    URINE, RANDOM Performed at [facility], [address] [place], [place] [address]    Special Requests   Final    NONE Performed at [facility], [address] [place], [place] [address]    Culture (A)  Final    <10,000 COLONIES/mL INSIGNIFICANT GROWTH Performed at [facility], [place] [address], [place], [place] [address]    Report Status 11/24/2019 FINAL  Final  Blood culture (routine x 2)     Status: None (Preliminary result)   Collection Time: 11/24/19  6:20 AM   Specimen: BLOOD  Result Value Ref Range Status   Specimen Description BLOOD LEFT HAND  Final   Special Requests   Final  BOTTLES DRAWN AEROBIC AND ANAEROBIC Blood Culture adequate volume   Culture   Final    NO GROWTH 1 DAY Performed at [facility], [place], [place] [address]    Report Status PENDING  Incomplete  SARS CORONAVIRUS 2 (TAT 6-24 HRS) Nasopharyngeal Nasopharyngeal Swab     Status: None   Collection Time: 11/24/19  8:56 AM   Specimen: Nasopharyngeal Swab  Result Value Ref Range Status   SARS Coronavirus 2 NEGATIVE NEGATIVE Final    Comment: (NOTE) SARS-CoV-2 target nucleic acids are NOT DETECTED. The SARS-CoV-2 RNA is generally detectable in upper and lower respiratory specimens during the acute phase of infection. Negative results do not preclude SARS-CoV-2 infection, do not rule out co-infections with other pathogens, and should not be used as the sole basis for treatment or other patient management decisions. Negative results must be combined with clinical observations, patient history, and epidemiological information. The expected result is Negative. Fact Sheet for Patients: SugarRoll.be Fact Sheet for Healthcare  Providers: https://www.woods-mathews.com/ This test is not yet approved or cleared by the [place] FDA and  has been authorized for detection and/or diagnosis of SARS-CoV-2 by FDA under an Emergency Use Authorization (EUA). This EUA will remain  in effect (meaning this test can be used) for the duration of the COVID-19 declaration under Section 56 4(b)(1) of the Act, 21 U.S.C. section 360bbb-3(b)(1), unless the authorization is terminated or revoked sooner. Performed at [facility], [place] [address] [place],  [address]          Radiology Studies: CT ABDOMEN PELVIS W CONTRAST  Result Date: 11/23/2019 CLINICAL DATA:  Nausea, vomiting, diarrhea for 3 days. Fever. Diverticulitis suspected. EXAM: CT ABDOMEN AND PELVIS WITH CONTRAST TECHNIQUE: Multidetector CT imaging of the abdomen and pelvis was performed using the standard protocol following bolus administration of intravenous contrast. CONTRAST:  7m OMNIPAQUE IOHEXOL 300 MG/ML  SOLN COMPARISON:  Abdominal CT 09/30/2019 FINDINGS: Lower chest: Minor atelectasis in the lung bases. No pleural fluid. Hepatobiliary: Cirrhotic hepatic morphology with nodular contours. No focal lesion. Postcholecystectomy without biliary dilatation. Pancreas: Parenchymal atrophy. No ductal dilatation or inflammation. Spleen: Splenomegaly with spleen measuring 14.5 AP. Adrenals/Urinary Tract: Right hydroureteronephrosis appears chronic and similar to prior exam. Suggestion of proximal ureteric enhancement. There is mild right perinephric edema. Mild right renal parenchymal thinning. No left hydronephrosis or hydroureter. Punctate nonobstructing stone in the mid left kidney. There is delayed excretion on delayed phase imaging from the right kidney. Urinary bladder is partially distended. There is wall thickening about the right dome in left bladder base. Mild perivesicular fat stranding. Stomach/Bowel: Varices about the distal esophagus and  gastroesophageal junction. Ingested material in the stomach. Normal positioning of the ligament of Treitz. No small bowel obstruction or inflammation. No terminal ileal inflammation. Appendix tentatively visualized draped posterior to the cecum. There is no evidence of appendicitis. Colonic tortuosity with small to moderate stool burden. No significant diverticular disease. No diverticulitis. Vascular/Lymphatic: Aortic atherosclerosis without aneurysm. The portal vein is patent. Varices in the left upper quadrant. Paraesophageal varices extend to the gastroesophageal junction. Multiple small retroperitoneal and periportal lymph nodes are likely reactive. Reproductive: Status post hysterectomy. No adnexal masses. Other: Trace fluid in the right pericolic gutter. No free air. No intra-abdominal abscess. Musculoskeletal: There are no acute or suspicious osseous abnormalities. Scoliotic curvature of spine. IMPRESSION: 1. Bladder wall thickening with perivesicular fat stranding. Right hydronephrosis and proximal hydroureter with ureteral thickening and perinephric edema. Findings are suspicious for cystitis  and ascending right urinary tract infection, however similar findings were also seen on prior CT in January. Recommend correlation with urinalysis. 2. Cirrhotic hepatic morphology with portal hypertension. Splenomegaly and paraesophageal and left upper quadrant varices. 3. Punctate nonobstructing stone in the left kidney. 4.  Aortic Atherosclerosis (ICD10-I70.0). Aortic Atherosclerosis (ICD10-I70.0). Electronically Signed   By: Keith Rake M.D.   On: 11/23/2019 20:59   DG Chest Portable 1 View  Result Date: 11/23/2019 CLINICAL DATA:  Cough. Dizziness. Fever. EXAM: PORTABLE CHEST 1 VIEW COMPARISON:  Radiograph 09/30/2019 FINDINGS: Low lung volumes. Patient is rotated. Borderline cardiomegaly, likely accentuated by technique. Aortic atherosclerosis. Bronchovascular crowding versus bronchial thickening. No  confluent airspace disease. No pleural effusion or pneumothorax. No acute osseous abnormalities are seen. IMPRESSION: Low lung volumes. Bronchovascular crowding versus bronchial thickening. Electronically Signed   By: Keith Rake M.D.   On: 11/23/2019 19:02        Scheduled Meds:  atorvastatin  40 mg Oral Daily   cholecalciferol  1,000 Units Oral Daily   enoxaparin (LOVENOX) injection  40 mg Subcutaneous Q24H   insulin aspart  0-5 Units Subcutaneous QHS   insulin aspart  0-9 Units Subcutaneous TID WC   insulin detemir  30 Units Subcutaneous QHS   insulin detemir  34 Units Subcutaneous QPC breakfast   magnesium oxide  400 mg Oral BID   sertraline  50 mg Oral Daily   Continuous Infusions:  cefTRIAXone (ROCEPHIN)  IV 2 g (11/24/19 1743)     LOS: 2 days    Enzo Bi, MD [facility]   If 7PM-7AM, please contact night-coverage [email or web address] 11/25/2019, 4:13 PM

## 2019-11-26 DIAGNOSIS — R109 Unspecified abdominal pain: Secondary | ICD-10-CM

## 2019-11-26 DIAGNOSIS — R300 Dysuria: Secondary | ICD-10-CM

## 2019-11-26 LAB — GLUCOSE, CAPILLARY
Glucose-Capillary: 115 mg/dL — ABNORMAL HIGH (ref 70–99)
Glucose-Capillary: 189 mg/dL — ABNORMAL HIGH (ref 70–99)
Glucose-Capillary: 202 mg/dL — ABNORMAL HIGH (ref 70–99)
Glucose-Capillary: 206 mg/dL — ABNORMAL HIGH (ref 70–99)
Glucose-Capillary: 222 mg/dL — ABNORMAL HIGH (ref 70–99)
Glucose-Capillary: 234 mg/dL — ABNORMAL HIGH (ref 70–99)
Glucose-Capillary: 250 mg/dL — ABNORMAL HIGH (ref 70–99)

## 2019-11-26 LAB — CBC
HCT: 31.3 % — ABNORMAL LOW (ref 36.0–46.0)
Hemoglobin: 9.5 g/dL — ABNORMAL LOW (ref 12.0–15.0)
MCH: 25.3 pg — ABNORMAL LOW (ref 26.0–34.0)
MCHC: 30.4 g/dL (ref 30.0–36.0)
MCV: 83.5 fL (ref 80.0–100.0)
Platelets: 123 10*3/uL — ABNORMAL LOW (ref 150–400)
RBC: 3.75 MIL/uL — ABNORMAL LOW (ref 3.87–5.11)
RDW: 16.1 % — ABNORMAL HIGH (ref 11.5–15.5)
WBC: 4.7 10*3/uL (ref 4.0–10.5)
nRBC: 0 % (ref 0.0–0.2)

## 2019-11-26 LAB — BASIC METABOLIC PANEL
Anion gap: 7 (ref 5–15)
BUN: 23 mg/dL (ref 8–23)
CO2: 23 mmol/L (ref 22–32)
Calcium: 8 mg/dL — ABNORMAL LOW (ref 8.9–10.3)
Chloride: 103 mmol/L (ref 98–111)
Creatinine, Ser: 1.18 mg/dL — ABNORMAL HIGH (ref 0.44–1.00)
GFR calc Af Amer: 56 mL/min — ABNORMAL LOW (ref >60)
GFR calc non Af Amer: 48 mL/min — ABNORMAL LOW (ref >60)
Glucose, Bld: 205 mg/dL — ABNORMAL HIGH (ref 70–99)
Potassium: 4.2 mmol/L (ref 3.5–5.1)
Sodium: 133 mmol/L — ABNORMAL LOW (ref 135–145)

## 2019-11-26 LAB — MAGNESIUM: Magnesium: 2.2 mg/dL (ref 1.7–2.4)

## 2019-11-26 NOTE — Progress Notes (Signed)
PROGRESS NOTE    [name]  RKY:[id number] DOB: Dec 14, 1955 DOA: 11/23/2019  PCP: [name], NP    LOS - 3   Brief Narrative:  [name]  is a 64 y.o. female with an extensive past medical  history including HTN, pAFib, sick sinus syndrome, DM, recurrent UTIs, h/o diverticulitis and C. Diff colitis and h/o intussusception, who presented to the ED on 3/15 with urinary frequency, bilateral flank pain, generalized weakness, nausea/vomiting, single episode diarrhea and dizziness.  No dysuria, oliguria or hematuria.  Initial BP 111/70 later 162/86, intermittently tachypneic.  Blood glucose 404, lactic acid 2.3, CBC showed stable anemia.  UA consistent with UTI.  Blood and urine cultures obtained.  Chest x-ray showed low lung volumes with bronchovascular crowding versus bronchial thickening.  EKG was sinus tachycardia with heart rate 111 Q waves in V1 and III. Treated in the ED with Rocephin and IV fluids indicated for further evaluation management.  Subjective 3/16: Still reported burning with urination.  No fever, chest pain,N/V/D, increased swelling.  Urology consult today.   Assessment & Plan:   Active Problems:   Sepsis ([place])   Questionable UTI  Dysuria, and hematuria Sepsis ruled out --dysuria started after presentation. --Continue empiric Rocephin --urine cx no significant growth --Follow-up repeat lactic acid --continue Pyridium  # persistent right hydronephrosis and hydroureter # history of a retained right ureteral stent for over 3 years in clinic on 08/24/2019 # Bladder wall thickening # Non-compliant with followups --consult urology today --Nuc med renal scan tomorrow --outpatient cystoscopy  Uncontrolled type 2 diabetes mellitus -A1c 10.4% Home regimen: Levemir 30 units twice daily --Continue home Levemir --Sliding scale NovoLog  Mild Cr increase acute kidney injury ruled out --s/p IVF hydration  Chronic back pain Degenerative disc disease  Myofascial pain syndrome --Continue home oxycodone and Flexeril  Dyslipidemia -continue Lipitor  Depression -continue Zoloft  Dyspnea, subjective --No hypoxia noted.  Pt asked to be put on suppl O2.    DVT prophylaxis: Lovenox   Code Status: DNR  Family Communication: None at bedside during encounter  Disposition Plan: Expect home with HHPT tomorrow after Nuc med renal scan, and if cleared by urology.   Consultants:   None  Procedures:   None  Antimicrobials:   Rocephin 3/15 >>   Objective: Vitals:   11/26/19 0817 11/26/19 0928 11/26/19 1141 11/26/19 1536  BP: (!) 154/83  135/72 130/84  Pulse: (!) 110  (!) 110 (!) 106  Resp: 18  18 18   Temp: 98 F (36.7 C)  97.8 F (36.6 C) 98.1 F (36.7 C)  TempSrc: Oral  Oral Oral  SpO2: 100% 100% 100% 98%  Weight:      Height:        Intake/Output Summary (Last 24 hours) at 11/26/2019 1903 Last data filed at 11/26/2019 1815 Gross per 24 hour  Intake 720 ml  Output 950 ml  Net -230 ml   Filed Weights   11/24/19 0050 11/24/19 2236 11/26/19 0417  Weight: 81.5 kg 76.2 kg 78.3 kg    Examination:  General exam: awake, alert, no acute distress, mildly ill-appearing HEENT: Conjunctival injection, anicteric sclera, moist mucus membranes, hearing grossly normal  Respiratory system: CTAB, no wheezes, rales or rhonchi, slightly increased respiratory effort. Cardiovascular system: normal S1/S2, tachycardic, no JVD, murmurs, rubs, gallops, no pedal edema.   Gastrointestinal system: soft, lower abdomen tender, nondistended, no HSM felt, +bowel sounds. Central nervous system: A&O x4. no gross focal neurologic deficits, normal speech Extremities: moves all,  no edema, normal tone Skin: dry, intact, warm Psychiatry: normal mood, congruent affect, judgement and insight appear normal    Data Reviewed: I have personally reviewed following labs and imaging studies  CBC: Recent Labs  Lab 11/23/19 1754 11/25/19 0324 11/26/19  0501  WBC 4.2 5.5 4.7  NEUTROABS 3.1 4.7  --   HGB 9.4* 9.7* 9.5*  HCT 30.2* 31.7* 31.3*  MCV 82.3 82.3 83.5  PLT 145* 140* 633*   Basic Metabolic Panel: Recent Labs  Lab 11/23/19 1754 11/24/19 0620 11/25/19 0324 11/26/19 0501  NA 135 139 134* 133*  K 3.8 4.0 4.3 4.2  CL 106 108 105 103  CO2 20* 24 24 23   GLUCOSE 404* 119* 231* 205*  BUN 17 17 19 23   CREATININE 1.32* 1.01* 1.08* 1.18*  CALCIUM 8.0* 7.9* 7.8* 8.0*  MG  --   --  1.9 2.2   GFR: Estimated Creatinine Clearance: 47.1 mL/min (A) (by C-G formula based on SCr of 1.18 mg/dL (H)). Liver Function Tests: Recent Labs  Lab 11/23/19 1754 11/25/19 0324  AST 21 34  ALT 22 24  ALKPHOS 117 120  BILITOT 0.5 0.7  PROT 6.3* 6.3*  ALBUMIN 3.1* 3.0*   No results for input(s): LIPASE, AMYLASE in the last 168 hours. No results for input(s): AMMONIA in the last 168 hours. Coagulation Profile: Recent Labs  Lab 11/24/19 0620  INR 1.1   Cardiac Enzymes: No results for input(s): CKTOTAL, CKMB, CKMBINDEX, TROPONINI in the last 168 hours. BNP (last 3 results) No results for input(s): PROBNP in the last 8760 hours. HbA1C: Recent Labs    11/24/19 0620  HGBA1C 10.4*   CBG: Recent Labs  Lab 11/26/19 0007 11/26/19 0429 11/26/19 0829 11/26/19 1142 11/26/19 1611  GLUCAP 206* 189* 234* 222* 202*   Lipid Profile: No results for input(s): CHOL, HDL, LDLCALC, TRIG, CHOLHDL, LDLDIRECT in the last 72 hours. Thyroid Function Tests: No results for input(s): TSH, T4TOTAL, FREET4, T3FREE, THYROIDAB in the last 72 hours. Anemia Panel: No results for input(s): VITAMINB12, FOLATE, FERRITIN, TIBC, IRON, RETICCTPCT in the last 72 hours. Sepsis Labs: Recent Labs  Lab 11/23/19 1754 11/23/19 2008 11/24/19 0620 11/24/19 2120  PROCALCITON  --   --  <0.10  --   LATICACIDVEN 2.3* 2.0*  --  0.8    Recent Results (from the past 240 hour(s))  Blood culture (routine x 2)     Status: None (Preliminary result)   Collection Time:  11/23/19  5:55 PM   Specimen: BLOOD  Result Value Ref Range Status   Specimen Description BLOOD LEFT ANTECUBITAL  Final   Special Requests   Final    BOTTLES DRAWN AEROBIC AND ANAEROBIC Blood Culture adequate volume   Culture   Final    NO GROWTH 3 DAYS Performed at [facility], [address] [place], [place] [address]    Report Status PENDING  Incomplete  Urine culture     Status: Abnormal   Collection Time: 11/23/19  5:55 PM   Specimen: Urine, Random  Result Value Ref Range Status   Specimen Description   Final    URINE, RANDOM Performed at [facility], [address], [place], [place] [address]    Special Requests   Final    NONE Performed at [facility], [address], [place], [place] [address]    Culture (A)  Final    <10,000 COLONIES/mL INSIGNIFICANT GROWTH Performed at [facility], [place] [address], [place], [place] [address]  Report Status 11/24/2019 FINAL  Final  Blood culture (routine x 2)     Status: None (Preliminary result)   Collection Time: 11/24/19  6:20 AM   Specimen: BLOOD  Result Value Ref Range Status   Specimen Description BLOOD LEFT HAND  Final   Special Requests   Final    BOTTLES DRAWN AEROBIC AND ANAEROBIC Blood Culture adequate volume   Culture   Final    NO GROWTH 2 DAYS Performed at [facility], [address], [place], [place] [address]    Report Status PENDING  Incomplete  SARS CORONAVIRUS 2 (TAT 6-24 HRS) Nasopharyngeal Nasopharyngeal Swab     Status: None   Collection Time: 11/24/19  8:56 AM   Specimen: Nasopharyngeal Swab  Result Value Ref Range Status   SARS Coronavirus 2 NEGATIVE NEGATIVE Final    Comment: (NOTE) SARS-CoV-2 target nucleic acids are NOT DETECTED. The SARS-CoV-2 RNA is generally detectable in upper and lower respiratory specimens during the acute phase of infection. Negative results do not preclude SARS-CoV-2 infection, do not rule out co-infections with other  pathogens, and should not be used as the sole basis for treatment or other patient management decisions. Negative results must be combined with clinical observations, patient history, and epidemiological information. The expected result is Negative. Fact Sheet for Patients: SugarRoll.be Fact Sheet for Healthcare Providers: https://www.woods-mathews.com/ This test is not yet approved or cleared by the [place] FDA and  has been authorized for detection and/or diagnosis of SARS-CoV-2 by FDA under an Emergency Use Authorization (EUA). This EUA will remain  in effect (meaning this test can be used) for the duration of the COVID-19 declaration under Section 56 4(b)(1) of the Act, 21 U.S.C. section 360bbb-3(b)(1), unless the authorization is terminated or revoked sooner. Performed at [facility], [place] [address] [place],  [address]          Radiology Studies: No results found.      Scheduled Meds: . atorvastatin  40 mg Oral Daily  . cholecalciferol  1,000 Units Oral Daily  . enoxaparin (LOVENOX) injection  40 mg Subcutaneous Q24H  . insulin aspart  0-5 Units Subcutaneous QHS  . insulin aspart  0-9 Units Subcutaneous TID WC  . insulin detemir  30 Units Subcutaneous QHS  . insulin detemir  34 Units Subcutaneous QPC breakfast  . magnesium oxide  400 mg Oral BID  . phenazopyridine  100 mg Oral TID WC  . sertraline  50 mg Oral Daily   Continuous Infusions: . cefTRIAXone (ROCEPHIN)  IV 2 g (11/26/19 1657)     LOS: 3 days    Enzo Bi, MD [facility]   If 7PM-7AM, please contact night-coverage [email or web address] 11/26/2019, 7:03 PM

## 2019-11-26 NOTE — Plan of Care (Signed)
  Problem: Health Behavior/Discharge Planning: Goal: Ability to manage health-related needs will improve Outcome: Progressing   

## 2019-11-26 NOTE — Evaluation (Signed)
Physical Therapy Evaluation Patient Details Name: [name] MRN: [id number] DOB: April 11, 1956 Today's Date: 11/26/2019   History of Present Illness  presented to ER secondary to generalized weakness, dizziness and nausea/vomiting; admitted for management of sepsis with severe shock related to UTI.  Clinical Impression  Upon evaluation, patient alert and oriented; follows commands and agreeable to PT evaluation.  Bilat UE/LE strength and ROM grossly symmetrical and WFL; no focal weakness appreciated.  Able to complete bed mobility with cga/close sup, heavy use of bedrails; sit/stand, basic transfers and gait (30') with RW, cga/min assist.  Demonstrates slow and guarded, mod reliance on RW; slight LOB with turns/obstacle negotiation, cga/min assist for safety.  Sats 100% on 2L throughout; does endorse dizziness with gait efforts, requiring seated rest period after 30' distance. Of note, patient with significant SBP drop with transition from sit/stand, accompanied by subjective reports of dizziness.  Patient unable to fully accommodate to position change; activity limited as result. Would benefit from skilled PT to address above deficits and promote optimal return to PLOF.; Recommend transition to HHPT upon discharge from acute hospitalization.       Follow Up Recommendations Home health PT    Equipment Recommendations       Recommendations for Other Services       Precautions / Restrictions Precautions Precautions: Fall Restrictions Weight Bearing Restrictions: No      Mobility  Bed Mobility Overal bed mobility: Needs Assistance Bed Mobility: Supine to Sit     Supine to sit: Min guard;Supervision     General bed mobility comments: heavy use of bedrails to complete  Transfers Overall transfer level: Needs assistance Equipment used: Rolling walker (2 wheeled) Transfers: Sit to/from Stand Sit to Stand: Min guard;Min assist         General transfer comment: multiple  attempts required to complete initial stand; heavy use of bilat UEs to complete.  Does require rest periods with each position change to manage subjective dizziness  Ambulation/Gait Ambulation/Gait assistance: Min guard;Min assist Gait Distance (Feet): 30 Feet Assistive device: Rolling walker (2 wheeled)       General Gait Details: slow and guarded, mod reliance on RW; slight LOB with turns/obstacle negotiation, cga/min assist for safety.  Sats 100% on 2L throughout; does endorse dizziness with gait efforts, requiring seated rest period after 30' distance  Stairs            Wheelchair Mobility    Modified Rankin (Stroke Patients Only)       Balance Overall balance assessment: Needs assistance Sitting-balance support: No upper extremity supported;Feet supported Sitting balance-Leahy Scale: Good     Standing balance support: Bilateral upper extremity supported Standing balance-Leahy Scale: Fair                               Pertinent Vitals/Pain Pain Assessment: No/denies pain    Home Living Family/patient expects to be discharged to:: Private residence Living Arrangements: Spouse/significant other;Children;Other relatives Available Help at Discharge: Family;Available 24 hours/day Type of Home: Mobile home Home Access: Ramped entrance     Home Layout: One level Home Equipment: [place] - 2 wheels;Cane - quad;Cane - single point;Bedside commode;Walker - 4 wheels      Prior Function Level of Independence: Independent with assistive device(s)         Comments: Pt reports she used 2WW for short household distances, has BSC that she uses at night. States she has recliner she sits in most  of day. She is usually able to make it to the restroom, sometimes she needs her husband's support to make it back to bedroom with 2WW in the evening. States she sponge bathes in sitting.  Denies home O2.  Does endorse at least 20 falls in previous six months (related to  dizziness with transition to upright)     Hand Dominance        Extremity/Trunk Assessment   Upper Extremity Assessment Upper Extremity Assessment: Overall WFL for tasks assessed(grossly at least 4+/5 throughout)    Lower Extremity Assessment Lower Extremity Assessment: Overall WFL for tasks assessed(grossly at least 4+/5 throughout)       Communication   Communication: No difficulties  Cognition Arousal/Alertness: Awake/alert Behavior During Therapy: WFL for tasks assessed/performed Overall Cognitive Status: Within Functional Limits for tasks assessed                                        General Comments      Exercises Other Exercises Other Exercises: Educated in role/importance of rest periods with position changes to assess/manage symptoms of orthostasis; patient voiced undersatnding and returned good demonstration of techniques. Other Exercises: Also reviewed techniques for bladder retraining and control (scheduled toileting vs waiting for urge), encouraging OOB to BSC vs purewick as appropriate; patient voiced understanding.   Assessment/Plan    PT Assessment Patient needs continued PT services  PT Problem List Decreased activity tolerance;Decreased balance;Decreased mobility;Cardiopulmonary status limiting activity       PT Treatment Interventions DME instruction;Gait training;Functional mobility training;Therapeutic activities;Therapeutic exercise;Balance training;Patient/family education    PT Goals (Current goals can be found in the Care Plan section)  Acute Rehab PT Goals Patient Stated Goal: to return home PT Goal Formulation: With patient Time For Goal Achievement: 12/10/19 Potential to Achieve Goals: Good    Frequency Min 2X/week   Barriers to discharge        Co-evaluation               AM-PAC PT "6 Clicks" Mobility  Outcome Measure Help needed turning from your back to your side while in a flat bed without using  bedrails?: None Help needed moving from lying on your back to sitting on the side of a flat bed without using bedrails?: None Help needed moving to and from a bed to a chair (including a wheelchair)?: A Little Help needed standing up from a chair using your arms (e.g., wheelchair or bedside chair)?: A Little Help needed to walk in hospital room?: A Little Help needed climbing 3-5 steps with a railing? : A Little 6 Click Score: 20    End of Session Equipment Utilized During Treatment: Gait belt;Oxygen Activity Tolerance: Patient tolerated treatment well Patient left: in bed;with call bell/phone within reach;with bed alarm set Nurse Communication: Mobility status PT Visit Diagnosis: Muscle weakness (generalized) (M62.81);Difficulty in walking, not elsewhere classified (R26.2)    Time: 0488-8916 PT Time Calculation (min) (ACUTE ONLY): 32 min   Charges:   PT Evaluation $PT Eval Moderate Complexity: 1 Mod PT Treatments $Therapeutic Activity: 8-22 mins        Priscella Donna H. Owens Shark, PT, DPT, NCS 11/26/19, 9:38 AM [phone]

## 2019-11-26 NOTE — Consult Note (Addendum)
Urology Consult  I have been asked to see the patient by Dr. Billie Ruddy, for evaluation and management of right hydroureteronephrosis.  Chief Complaint: Bilateral flank pain, dysuria  History of Present Illness: [name] is a 64 y.o. year old comorbid female admitted on 11/23/2019 for sepsis with possible urinary source after presenting to the ED with complaints of abdominal pain, nausea, vomiting, diarrhea, dizziness, and hyperglycemia.  CTAP with contrast revealed right hydroureteronephrosis with ureteral thickening, perinephric edema, and bladder wall thickening similar to findings on CTAP with contrast from 09/30/2019.  Blood cultures pending with no growth at 3 days.  Urine culture finalized with insignificant growth.  On antibiotics as below.  Patient is well-known to our practice.  She has a history of right ureteral stone requiring ureteral stent placement in 2017.  Patient was subsequently lost to follow-up for 3 years with the stent in place.  She finally underwent cystoscopy stent removal with Dr. Diamantina Providence on 08/24/2019.  She was instructed to follow-up 6 weeks later with renal ultrasound for evaluation of silent hydronephrosis, however she was again lost to follow-up.  Today, patient reports a 3-day history of new onset difficulty urinating, dysuria, and stabbing lower abdominal pain with urination.  She states the dysuria and abdominal pain are improved with urination.  Additionally, she reports an approximate 2 to 22-monthhistory of bilateral back pain L>R.  She states the pain has limited her ability to stand for prolonged periods of time. She states that she knows she was due to follow-up in our clinic, however she lost her transportation was unable to do so.  Anti-infectives (From admission, onward)   Start     Dose/Rate Route Frequency Ordered Stop   11/24/19 1800  cefTRIAXone (ROCEPHIN) 1 g in sodium chloride 0.9 % 100 mL IVPB  Status:  Discontinued     1 g 200 mL/hr over 30  Minutes Intravenous Every 24 hours 11/23/19 2239 11/24/19 1000   11/24/19 1800  cefTRIAXone (ROCEPHIN) 2 g in sodium chloride 0.9 % 100 mL IVPB     2 g 200 mL/hr over 30 Minutes Intravenous Every 24 hours 11/24/19 1000 11/28/19 1759   11/23/19 1830  cefTRIAXone (ROCEPHIN) 2 g in sodium chloride 0.9 % 100 mL IVPB     2 g 200 mL/hr over 30 Minutes Intravenous  Once 11/23/19 1824 11/23/19 2056      Past Medical History:  Diagnosis Date  . Acute encephalopathy 05/22/2018  . Allergy   . Anxiety   . Ascites   . C. difficile colitis 07/10/2015  . Cancer (HCC)    HX OF CANCER OF UTERUS   . Cirrhosis of liver not due to alcohol ([facility] 2016  . Degenerative disk disease   . Diverticulitis   . Gastroparesis   . GERD (gastroesophageal reflux disease)   . History of hiatal hernia   . Hypertension   . Hypothyroid   . Hypothyroidism due to amiodarone   . Ileus ([place] 08/01/2015  . Intussusception intestine ([place] 05/2015  . Orthostatic hypotension   . PAF (paroxysmal atrial fibrillation) ([place] 03/2015   a. new onset 03/2015 in setting of intractable N/V; b. on Eliquis 5 mg bid; c. CHADSVASc 4 (DM, TIA x 2, female)  . Pancreatitis   . Pneumonia 11/14/2015  . Right ureteral stone 07/14/2016  . Sepsis ([facility] 12/15/2017  . Sick sinus syndrome ([place]   . Stomach ulcer   . Stroke ([facility]    with minimal left sided weakness  .  Syncope 01/2015  . Syncope due to orthostatic hypotension 05/18/2015  . Tachyarrhythmia 01/10/2016  . TIA (transient ischemic attack) 02/2015  . Type 1 diabetes ([place])    on levemir  . UTERINE CANCER, HX OF 03/27/2007   Qualifier: Diagnosis of  By: Maxie Better FNP, Rosalita Levan   . UTI (urinary tract infection) 05/22/2018    Past Surgical History:  Procedure Laterality Date  . ABDOMINAL HYSTERECTOMY    . CARDIAC CATHETERIZATION N/A 01/12/2016   Procedure: Left Heart Cath and Coronary Angiography;  Surgeon: Wellington Hampshire, MD;  Location: [place] [facility];  Service: Cardiovascular;   Laterality: N/A;  . CHOLECYSTECTOMY    . CYSTOSCOPY/URETEROSCOPY/HOLMIUM LASER Right 07/14/2016   Procedure: CYSTOSCOPY/URETEROSCOPY/HOLMIUM LASER;  Surgeon: Alexis Frock, MD;  Location: [facility] ORS;  Service: Urology;  Laterality: Right;  . ESOPHAGOGASTRODUODENOSCOPY N/A 04/04/2015   Procedure: ESOPHAGOGASTRODUODENOSCOPY (EGD);  Surgeon: Hulen Luster, MD;  Location: [facility] ENDOSCOPY;  Service: Endoscopy;  Laterality: N/A;  . ESOPHAGOGASTRODUODENOSCOPY N/A 12/28/2017   Procedure: ESOPHAGOGASTRODUODENOSCOPY (EGD);  Surgeon: Lin Landsman, MD;  Location[facility] ENDOSCOPY;  Service: Gastroenterology;  Laterality: N/A;  . ESOPHAGOGASTRODUODENOSCOPY (EGD) WITH PROPOFOL N/A 01/18/2016   Procedure: ESOPHAGOGASTRODUODENOSCOPY (EGD) WITH PROPOFOL;  Surgeon: Lucilla Lame, MD;  Location: [facility] ENDOSCOPY;  Service: Endoscopy;  Laterality: N/A;  . FLEXIBLE SIGMOIDOSCOPY N/A 01/18/2016   Procedure: FLEXIBLE SIGMOIDOSCOPY;  Surgeon: Lucilla Lame, MD;  Location: [facility] ENDOSCOPY;  Service: Endoscopy;  Laterality: N/A;  . HERNIA REPAIR      Home Medications:  Current Meds  Medication Sig  . albuterol (PROVENTIL HFA;VENTOLIN HFA) 108 (90 Base) MCG/ACT inhaler Inhale 2 puffs into the lungs every 6 (six) hours as needed for wheezing or shortness of breath.  Marland Kitchen atorvastatin (LIPITOR) 40 MG tablet Take 40 mg by mouth daily.  . cholecalciferol (VITAMIN D3) 25 MCG (1000 UNIT) tablet Take 1,000 Units by mouth daily.  . cyclobenzaprine (FLEXERIL) 10 MG tablet Take 1 tablet (10 mg total) by mouth 3 (three) times daily as needed for muscle spasms. Must last 30 days.  Marland Kitchen glucose blood (ACCU-CHEK AVIVA PLUS) test strip Use as instructed to test blood sugar up to 4 times daily  . Insulin Detemir (LEVEMIR) 100 UNIT/ML Pen Inject 30 Units into the skin 2 (two) times daily. (Patient taking differently: Inject 30-34 Units into the skin See admin instructions. Inject 34u under the skin every morning and inject 30u under the skin every night)    . magnesium gluconate (MAGONATE) 500 MG tablet Take 500 mg by mouth 2 (two) times daily.  . nitroGLYCERIN (NITROSTAT) 0.3 MG SL tablet Place 1 tablet (0.3 mg total) under the tongue every 5 (five) minutes as needed for chest pain.  Marland Kitchen ondansetron (ZOFRAN ODT) 4 MG disintegrating tablet Take 1 tablet (4 mg total) by mouth every 8 (eight) hours as needed for nausea or vomiting.  Marland Kitchen oxyCODONE (OXY IR/ROXICODONE) 5 MG immediate release tablet Take 1 tablet (5 mg total) by mouth every 4 (four) hours as needed for severe pain. Must last 30 days. Max: 6/day  . sertraline (ZOLOFT) 50 MG tablet Take 1 tablet (50 mg total) by mouth daily. For anxiety.    Allergies:  Allergies  Allergen Reactions  . Aspirin Rash  . Codeine Sulfate Rash  . Erythromycin Rash    Reaction:  Fever   . Prednisone Swelling  . Rosiglitazone Maleate Swelling  . Tetanus-Diphtheria Toxoids Td Rash and Other (See Comments)    Reaction:  Fever     Family History  Problem Relation Age of Onset  . Hypertension Mother   . CAD Sister   . Heart attack Sister        Deceased 11-19-2014  . CAD Brother     Social History:  reports that she has quit smoking. Her smoking use included cigarettes. She has never used smokeless tobacco. She reports that she does not drink alcohol or use drugs.  ROS: A complete review of systems was performed.  All systems are negative except for pertinent findings as noted.  Physical Exam:  Vital signs in last 24 hours: Temp:  [97.8 F (36.6 C)-98.8 F (37.1 C)] 98.1 F (36.7 C) (03/18 1536) Pulse Rate:  [106-113] 106 (03/18 1536) Resp:  [18-19] 18 (03/18 1536) BP: (130-154)/(72-90) 130/84 (03/18 1536) SpO2:  [98 %-100 %] 98 % (03/18 1536) Weight:  [78.3 kg] 78.3 kg (03/18 0417) Constitutional:  Alert and oriented, no acute distress HEENT: [place] AT, moist mucus membranes.  Trachea midline Cardiovascular: No clubbing, cyanosis, or edema. Respiratory: Normal respiratory effort GU: Bilateral CVA  tenderness Neurologic: Grossly intact, no focal deficits, moving all 4 extremities Psychiatric: Anxious mood and affect  Laboratory Data:  Recent Labs    11/23/19 1754 11/25/19 0324 11/26/19 0501  WBC 4.2 5.5 4.7  HGB 9.4* 9.7* 9.5*  HCT 30.2* 31.7* 31.3*   Recent Labs    11/24/19 0620 11/25/19 0324 11/26/19 0501  NA 139 134* 133*  K 4.0 4.3 4.2  CL 108 105 103  CO2 24 24 23   GLUCOSE 119* 231* 205*  BUN 17 19 23   CREATININE 1.01* 1.08* 1.18*  CALCIUM 7.9* 7.8* 8.0*   Recent Labs    11/24/19 0620  INR 1.1   Urinalysis    Component Value Date/Time   COLORURINE YELLOW (A) 11/23/2019 1755   APPEARANCEUR HAZY (A) 11/23/2019 1755   APPEARANCEUR Cloudy (A) 08/24/2019 1104   LABSPEC 1.015 11/23/2019 1755   LABSPEC 1.033 10/08/2014 0144   PHURINE 6.0 11/23/2019 1755   GLUCOSEU >=500 (A) 11/23/2019 1755   GLUCOSEU >=500 10/08/2014 0144   HGBUR LARGE (A) 11/23/2019 1755   BILIRUBINUR NEGATIVE 11/23/2019 1755   BILIRUBINUR Negative 11/04/2019 1157   BILIRUBINUR Negative 08/24/2019 1104   BILIRUBINUR Negative 10/08/2014 0144   KETONESUR NEGATIVE 11/23/2019 1755   PROTEINUR 30 (A) 11/23/2019 1755   UROBILINOGEN 0.2 11/04/2019 1157   UROBILINOGEN 0.2 05/27/2015 1936   NITRITE NEGATIVE 11/23/2019 1755   LEUKOCYTESUR TRACE (A) 11/23/2019 1755   LEUKOCYTESUR Negative 10/08/2014 0144   Results for orders placed or performed during the hospital encounter of 11/23/19  Blood culture (routine x 2)     Status: None (Preliminary result)   Collection Time: 11/23/19  5:55 PM   Specimen: BLOOD  Result Value Ref Range Status   Specimen Description BLOOD LEFT ANTECUBITAL  Final   Special Requests   Final    BOTTLES DRAWN AEROBIC AND ANAEROBIC Blood Culture adequate volume   Culture   Final    NO GROWTH 3 DAYS Performed at [facility], [address], [place], [place] [address]    Report Status PENDING  Incomplete  Urine culture     Status: Abnormal   Collection  Time: 11/23/19  5:55 PM   Specimen: Urine, Random  Result Value Ref Range Status   Specimen Description   Final    URINE, RANDOM Performed at [facility], [address], [place], [place] [address]    Special Requests   Final    NONE Performed at  [place] [place], [place] [address]    Culture (A)  Final    <10,000 COLONIES/mL INSIGNIFICANT GROWTH Performed at [place] [address] [place], [place] [address]    Report Status 11/24/2019 FINAL  Final  Blood culture (routine x 2)     Status: None (Preliminary result)   Collection Time: 11/24/19  6:20 AM   Specimen: BLOOD  Result Value Ref Range Status   Specimen Description BLOOD LEFT HAND  Final   Special Requests   Final    BOTTLES DRAWN AEROBIC AND ANAEROBIC Blood Culture adequate volume   Culture   Final    NO GROWTH 2 DAYS Performed at [facility], [address], [place], [place] [address]    Report Status PENDING  Incomplete  SARS CORONAVIRUS 2 (TAT 6-24 HRS) Nasopharyngeal Nasopharyngeal Swab     Status: None   Collection Time: 11/24/19  8:56 AM   Specimen: Nasopharyngeal Swab  Result Value Ref Range Status   SARS Coronavirus 2 NEGATIVE NEGATIVE Final    Comment: (NOTE) SARS-CoV-2 target nucleic acids are NOT DETECTED. The SARS-CoV-2 RNA is generally detectable in upper and lower respiratory specimens during the acute phase of infection. Negative results do not preclude SARS-CoV-2 infection, do not rule out co-infections with other pathogens, and should not be used as the sole basis for treatment or other patient management decisions. Negative results must be combined with clinical observations, patient history, and epidemiological information. The expected result is Negative. Fact Sheet for Patients: SugarRoll.be Fact Sheet for Healthcare Providers: https://www.woods-mathews.com/ This test is not yet approved  or cleared by the [place] FDA and  has been authorized for detection and/or diagnosis of SARS-CoV-2 by FDA under an Emergency Use Authorization (EUA). This EUA will remain  in effect (meaning this test can be used) for the duration of the COVID-19 declaration under Section 56 4(b)(1) of the Act, 21 U.S.C. section 360bbb-3(b)(1), unless the authorization is terminated or revoked sooner. Performed at [facility], [place] [address] [place],  [address]    Radiologic Imaging: CTAP with contrast, 11/23/2019: CLINICAL DATA:  Nausea, vomiting, diarrhea for 3 days. Fever. Diverticulitis suspected.  EXAM: CT ABDOMEN AND PELVIS WITH CONTRAST  TECHNIQUE: Multidetector CT imaging of the abdomen and pelvis was performed using the standard protocol following bolus administration of intravenous contrast.  CONTRAST:  35m OMNIPAQUE IOHEXOL 300 MG/ML  SOLN  COMPARISON:  Abdominal CT 09/30/2019  FINDINGS: Lower chest: Minor atelectasis in the lung bases. No pleural fluid.  Hepatobiliary: Cirrhotic hepatic morphology with nodular contours. No focal lesion. Postcholecystectomy without biliary dilatation.  Pancreas: Parenchymal atrophy. No ductal dilatation or inflammation.  Spleen: Splenomegaly with spleen measuring 14.5 AP.  Adrenals/Urinary Tract: Right hydroureteronephrosis appears chronic and similar to prior exam. Suggestion of proximal ureteric enhancement. There is mild right perinephric edema. Mild right renal parenchymal thinning. No left hydronephrosis or hydroureter. Punctate nonobstructing stone in the mid left kidney. There is delayed excretion on delayed phase imaging from the right kidney. Urinary bladder is partially distended. There is wall thickening about the right dome in left bladder base. Mild perivesicular fat stranding.  Stomach/Bowel: Varices about the distal esophagus and gastroesophageal junction. Ingested material in the stomach.  Normal positioning of the ligament of Treitz. No small bowel obstruction or inflammation. No terminal ileal inflammation. Appendix tentatively visualized draped posterior to the cecum. There is no evidence of appendicitis. Colonic tortuosity with small to moderate stool burden. No significant  diverticular disease. No diverticulitis.  Vascular/Lymphatic: Aortic atherosclerosis without aneurysm. The portal vein is patent. Varices in the left upper quadrant. Paraesophageal varices extend to the gastroesophageal junction. Multiple small retroperitoneal and periportal lymph nodes are likely reactive.  Reproductive: Status post hysterectomy. No adnexal masses.  Other: Trace fluid in the right pericolic gutter. No free air. No intra-abdominal abscess.  Musculoskeletal: There are no acute or suspicious osseous abnormalities. Scoliotic curvature of spine.  IMPRESSION: 1. Bladder wall thickening with perivesicular fat stranding. Right hydronephrosis and proximal hydroureter with ureteral thickening and perinephric edema. Findings are suspicious for cystitis and ascending right urinary tract infection, however similar findings were also seen on prior CT in January. Recommend correlation with urinalysis. 2. Cirrhotic hepatic morphology with portal hypertension. Splenomegaly and paraesophageal and left upper quadrant varices. 3. Punctate nonobstructing stone in the left kidney. 4.  Aortic Atherosclerosis (ICD10-I70.0).  Aortic Atherosclerosis (ICD10-I70.0).   Electronically Signed   By: Keith Rake M.D.   On: 11/23/2019 20:59  I personally reviewed the imaging above and note the presence of right hydroureteronephrosis with perinephric edema and bladder wall thickening, stable when compared with CT from 09/30/2019.  Assessment & Plan:  64 year old comorbid female with a history of a 3-year retained right ureteral stent repeatedly lost to follow-up who was recently admitted  for suspected sepsis of urinary origin with negative urine and preliminary negative blood cultures and stable CT findings of right hydroureteronephrosis with perinephric edema and bladder wall thickening.    Given urine and blood culture findings as well as stability of bladder wall thickening on cross-sectional imaging, I have a low degree of suspicion for urinary infection as the etiology of her current illness.  That said, she is due for further evaluation of new right hydronephrosis in the setting of her 3-year retained ureteral stent.  Recommend NM renal scan tomorrow for further evaluation (ordered).  Given patient's stability, will defer intervention for management of right hydronephrosis pending the outcome of the study.  Thank you for involving me in this patient's care, I will continue to follow along.  Debroah Loop, PA-C 11/26/2019 4:28 PM    MD addendum: I personally saw and examined the patient independently from our [place].  She is a 64 year old female with complex history including a retained right ureteral stent for over 3 years having been lost to follow-up numerous times.  This was removed in clinic on 08/24/2019, and I recommended 6-week follow-up for renal ultrasound to evaluate for any residual hydronephrosis.  She did not follow-up in clinic.  She has presented to the ER twice now in the last few months with vague complaints of abdominal and back pain bilaterally, dizziness, weakness, and nausea.  A CT scan on 09/30/2019 showed some asymmetric bladder wall thickening and some mild right hydroureteronephrosis.  Urology was not consulted at that time.  She was most recently admitted on 11/23/2019 with similar complaints and CT showed similar findings.  She is a poor historian, and she really complains more of left-sided low back pain then right-sided pain currently.  She was felt to have a UTI on admission, however urinalysis was grossly contaminated with  11-20 squamous cells, and urine culture was ultimately negative.  Blood cultures have also been negative.  She had no fever, leukocytosis, or AKI on admission, however subtle elevated lactate to 2.3.  Recommend nuc med renal scan to evaluate for function/obstruction of right kidney.  I would be very hesitant to place a ureteral stent in this patient  again, as she has already been lost to follow-up numerous times with an indwelling stent previously.  She certainly needs cystoscopic evaluation of her bladder wall thickening to rule out malignancy.  We discussed all these things today.  Okay to continue antibiotics, but I am not convinced that UTI is the etiology of her symptoms with a negative urine culture.  Nuc med renal scan tomorrow We will arrange outpatient cystoscopy, I stressed the importance at length of keeping this appointment Urology will continue to follow  Nickolas Madrid, MD 11/26/2019

## 2019-11-27 ENCOUNTER — Inpatient Hospital Stay: Payer: Medicare Other

## 2019-11-27 ENCOUNTER — Encounter: Payer: Self-pay | Admitting: Family Medicine

## 2019-11-27 DIAGNOSIS — N1330 Unspecified hydronephrosis: Secondary | ICD-10-CM

## 2019-11-27 LAB — BASIC METABOLIC PANEL
Anion gap: 8 (ref 5–15)
BUN: 25 mg/dL — ABNORMAL HIGH (ref 8–23)
CO2: 24 mmol/L (ref 22–32)
Calcium: 8.1 mg/dL — ABNORMAL LOW (ref 8.9–10.3)
Chloride: 100 mmol/L (ref 98–111)
Creatinine, Ser: 1.13 mg/dL — ABNORMAL HIGH (ref 0.44–1.00)
GFR calc Af Amer: 59 mL/min — ABNORMAL LOW (ref >60)
GFR calc non Af Amer: 51 mL/min — ABNORMAL LOW (ref >60)
Glucose, Bld: 246 mg/dL — ABNORMAL HIGH (ref 70–99)
Potassium: 4.4 mmol/L (ref 3.5–5.1)
Sodium: 132 mmol/L — ABNORMAL LOW (ref 135–145)

## 2019-11-27 LAB — GLUCOSE, CAPILLARY
Glucose-Capillary: 165 mg/dL — ABNORMAL HIGH (ref 70–99)
Glucose-Capillary: 275 mg/dL — ABNORMAL HIGH (ref 70–99)
Glucose-Capillary: 302 mg/dL — ABNORMAL HIGH (ref 70–99)
Glucose-Capillary: 193 mg/dL — ABNORMAL HIGH (ref 70–99)

## 2019-11-27 LAB — MAGNESIUM: Magnesium: 2.4 mg/dL (ref 1.7–2.4)

## 2019-11-27 LAB — CBC
HCT: 31.1 % — ABNORMAL LOW (ref 36.0–46.0)
Hemoglobin: 9.3 g/dL — ABNORMAL LOW (ref 12.0–15.0)
MCH: 25.3 pg — ABNORMAL LOW (ref 26.0–34.0)
MCHC: 29.9 g/dL — ABNORMAL LOW (ref 30.0–36.0)
MCV: 84.5 fL (ref 80.0–100.0)
Platelets: 121 10*3/uL — ABNORMAL LOW (ref 150–400)
RBC: 3.68 MIL/uL — ABNORMAL LOW (ref 3.87–5.11)
RDW: 16.1 % — ABNORMAL HIGH (ref 11.5–15.5)
WBC: 5.1 10*3/uL (ref 4.0–10.5)
nRBC: 0 % (ref 0.0–0.2)

## 2019-11-27 MED ORDER — INSULIN DETEMIR 100 UNIT/ML ~~LOC~~ SOLN
36.0000 [IU] | Freq: Every day | SUBCUTANEOUS | Status: DC
  Filled 2019-11-27: qty 0.36

## 2019-11-27 MED ORDER — INSULIN ASPART 100 UNIT/ML ~~LOC~~ SOLN
4.0000 [IU] | Freq: Three times a day (TID) | SUBCUTANEOUS | Status: DC
  Administered 2019-11-27: 4 [IU] via SUBCUTANEOUS
  Filled 2019-11-27: qty 1

## 2019-11-27 MED ORDER — TECHNETIUM TC 99M MERTIATIDE
5.2470 | Freq: Once | INTRAVENOUS | Status: AC | PRN
  Administered 2019-11-27: 5.247 via INTRAVENOUS

## 2019-11-27 MED ORDER — INSULIN DETEMIR 100 UNIT/ML FLEXPEN: 30.0000 [IU] | PEN_INJECTOR | SUBCUTANEOUS | Status: DC

## 2019-11-27 MED ORDER — ACETAMINOPHEN 500 MG PO TABS: 1000.0000 mg | ORAL_TABLET | Freq: Three times a day (TID) | ORAL | Status: DC | PRN

## 2019-11-27 MED ORDER — FUROSEMIDE 10 MG/ML IJ SOLN
37.4000 mg | Freq: Once | INTRAMUSCULAR | Status: AC
  Administered 2019-11-27: 37.4 mg via INTRAVENOUS
  Filled 2019-11-27: qty 4

## 2019-11-27 MED ORDER — SOLIFENACIN SUCCINATE 10 MG PO TABS: 10.0000 mg | ORAL_TABLET | Freq: Every day | ORAL | 0 refills | Status: DC

## 2019-11-27 NOTE — Progress Notes (Addendum)
UROLOGY PROGRESS NOTE  NM renal scan reviewed.  Only 6% function in atrophic right kidney with poor uptake and drainage.  Options would include ongoing observation versus simple right nephrectomy.  Interestingly, all her urine cultures since stent removal have been negative, and is unclear if she is truly having recurrent UTIs or sepsis from urinary source, or if her pain is even related to her mild right-sided hydronephrosis as she primarily has left-sided flank pain.  She has been lost to follow-up numerous times, and I had a long discussion with the patient today about these  options moving forward, and the importance of close follow-up to set up likely simple right laparoscopic nephrectomy in the future. Also needs clinic cystoscopy to evaluate bladder wall thickening seen on CT.  Follow up in 1 week in urology clinic for cystoscopy and further discussion of poorly functioning right kidney  Nickolas Madrid, MD 11/27/2019

## 2019-11-27 NOTE — Discharge Summary (Addendum)
Physician Discharge Summary   [name]  female DOB: 1956-08-13  HWE:[id number]  PCP: Rita Koch, NP  Admit date: 11/23/2019 Discharge date: 11/27/2019  Admitted From: home Disposition:  home Home Health: Yes CODE STATUS: DNR  Discharge Instructions    Diet - low sodium heart healthy   Complete by: As directed    Discharge instructions   Complete by: As directed    Please be sure to follow up with urologist this time.  Dr. Doristine Counter office will call you to schedule you an appointment for next week.  Because you already have prescription for opioid pain meds, I can't prescribe additional ones.     Dr. Enzo Bi - -   Increase activity slowly   Complete by: As directed        Hospital Course:  For full details, please see H&P, progress notes, consult notes and ancillary notes.  Briefly,  [name] a63 y.o. Caucasianfemalewith an extensive past medical  history including HTN, pAFib, sick sinus syndrome, DM, recurrent UTIs, h/o diverticulitis and C. Diff colitisandh/o intussusception,who presented to the ED on 3/15 with urinary frequency, bilateral flank pain, generalized weakness, nausea/vomiting, single episode diarrhea and dizziness.  No dysuria, oliguria or hematuria.  # Questionable UTI  Sepsis ruled out # Dysuria, and hematuria # Bladder wall thickening concerning for cancer UA showed large blood, neg nitrite, trance leuk.  Dysuria started after presentation.  Pt was started on Rocephin on presentation, however, urine cx had no significant growth.  Pt received 4 days of ceftriaxone.  Pt was tried on Pyridium which didn't help with dysuria.  Urology was consulted who recommended outpatient cystoscopy for concern of bladder cancer.  Urology also recommended VESICARE for dysuria which was prescribed at discharge.  # Persistent right hydronephrosis and hydroureter # History of a retained right ureteral stent for over 3 years in clinic on  08/24/2019 # Non-compliant with followups Urology was consulted who recommended Nuc med renal scan showed "Markedly diminished renal function of a small RIGHT kidney with mild collecting system dilatation and poor clearance of tracer."  Pt will follow up with Urology as outpatient to discuss further management.  Uncontrolled type 2 diabetes mellitus  A1c 10.4%.  Pt received Levemir 30 units twice daily and SSI while inpatient.  Mild Cr increase acute kidney injury ruled out Cr 1.32 on presentation, was around 1 in Jan 2021.  Cr improved with IVF.  Cr 1.13 prior to discharge.  Chronic back pain Degenerative disc disease Myofascial pain syndrome Continued home oxycodone and Flexeril  Dyslipidemia  continued Lipitor  Depression  continued Zoloft  Dyspnea, subjective No hypoxia noted.  CXR on presentation showed "Low lung volumes. Bronchovascular crowding versus bronchial Thickening."  Pt was encouraged to take more deep breaths and get up and become more mobile to open up her lungs more.   Discharge Diagnoses:  Active Problems:   Sepsis [facility])    Discharge Instructions:  Allergies as of 11/27/2019      Reactions   Aspirin Rash   Codeine Sulfate Rash   Erythromycin Rash   Reaction:  Fever    Prednisone Swelling   Rosiglitazone Maleate Swelling   Tetanus-diphtheria Toxoids Td Rash, Other (See Comments)   Reaction:  Fever       Medication List    STOP taking these medications   midodrine 5 MG tablet Commonly known as: PROAMATINE     TAKE these medications   Accu-Chek Aviva Plus test strip Generic drug: glucose blood  Use as instructed to test blood sugar up to 4 times daily   albuterol 108 (90 Base) MCG/ACT inhaler Commonly known as: VENTOLIN HFA Inhale 2 puffs into the lungs every 6 (six) hours as needed for wheezing or shortness of breath.   atorvastatin 40 MG tablet Commonly known as: LIPITOR Take 40 mg by mouth daily.   cholecalciferol 25 MCG (1000  UNIT) tablet Commonly known as: VITAMIN D3 Take 1,000 Units by mouth daily.   cyclobenzaprine 10 MG tablet Commonly known as: FLEXERIL Take 1 tablet (10 mg total) by mouth 3 (three) times daily as needed for muscle spasms. Must last 30 days.   insulin detemir 100 UNIT/ML FlexPen Commonly known as: LEVEMIR Inject 30-34 Units into the skin See admin instructions. Inject 34u under the skin every morning and inject 30u under the skin every night   magnesium gluconate 500 MG tablet Commonly known as: MAGONATE Take 500 mg by mouth 2 (two) times daily.   nitroGLYCERIN 0.3 MG SL tablet Commonly known as: Nitrostat Place 1 tablet (0.3 mg total) under the tongue every 5 (five) minutes as needed for chest pain.   ondansetron 4 MG disintegrating tablet Commonly known as: Zofran ODT Take 1 tablet (4 mg total) by mouth every 8 (eight) hours as needed for nausea or vomiting.   oxyCODONE 5 MG immediate release tablet Commonly known as: Oxy IR/ROXICODONE Take 1 tablet (5 mg total) by mouth every 4 (four) hours as needed for severe pain. Must last 30 days. Max: 6/day What changed: Another medication with the same name was removed. Continue taking this medication, and follow the directions you see here.   oxyCODONE 5 MG immediate release tablet Commonly known as: Oxy IR/ROXICODONE Take 1 tablet (5 mg total) by mouth every 4 (four) hours as needed for severe pain. Must last 30 days. Max: 6/day Start taking on: December 10, 2019 What changed: Another medication with the same name was removed. Continue taking this medication, and follow the directions you see here.   sertraline 50 MG tablet Commonly known as: ZOLOFT Take 1 tablet (50 mg total) by mouth daily. For anxiety.   solifenacin 10 MG tablet Commonly known as: VESICARE Take 1 tablet (10 mg total) by mouth daily. For pain with urination.       Follow-up Information    Rita Koch, NP. Schedule an appointment as soon as possible for  a visit in 1 week(s).   Specialty: Internal Medicine Contact information: [place] [address] [phone]        Rita Merritts, MD .   Specialty: Cardiology Contact information: [place] [address] [phone]        Rita Co, MD Follow up.   Specialty: Urology Why: Dr. Doristine Counter office will call you to schedule an appointment.  It's very important you follow up with him in clinic. Contact information: [place] [address] [phone]           Allergies  Allergen Reactions  . Aspirin Rash  . Codeine Sulfate Rash  . Erythromycin Rash    Reaction:  Fever   . Prednisone Swelling  . Rosiglitazone Maleate Swelling  . Tetanus-Diphtheria Toxoids Td Rash and Other (See Comments)    Reaction:  Fever      The results of significant diagnostics from this hospitalization (including imaging, microbiology, ancillary and laboratory) are listed below for reference.   Consultations:   Procedures/Studies: NM  Renal Imaging Flow W/Pharm  Result Date: 11/27/2019 CLINICAL DATA:  RIGHT hydroureteronephrosis, history of RIGHT ureteral calculus and ureteral stenting in 2017 with stent removal in December 2020 EXAM: NUCLEAR MEDICINE RENAL SCAN WITH DIURETIC ADMINISTRATION TECHNIQUE: Radionuclide angiographic and sequential renal images were obtained after intravenous injection of radiopharmaceutical. Imaging was continued during slow intravenous injection of Lasix approximately 15 minutes after the start of the examination. RADIOPHARMACEUTICALS:  5.247 mCi Technetium-48mMAG3 IV Pharmaceutical: 37.4 mg furosemide IV COMPARISON:  CT abdomen and pelvis 11/23/2019 FINDINGS: Flow: Prompt blood flow to LEFT kidney. Markedly diminished and slightly delayed blood flow to RIGHT kidney. Left renogram: Normal uptake, concentration and excretion of tracer by LEFT kidney. Good clearance of tracer from the LEFT kidney  both before and continuing following Lasix administration. No significant retained tracer at the conclusion of the exam. Analysis of the renogram curve demonstrates a normal time to peak activity of 4 minutes with a fall to half maximum activity (pre Lasix) of 19 minutes. Right renogram: Markedly diminished uptake, concentration and excretion of tracer by a small atrophic RIGHT kidney. Mild collecting system dilatation RIGHT kidney. Poor clearance of tracer from RIGHT kidney both before and following Lasix administration. Analysis of the renogram curve demonstrates a markedly diminished curve height with a continually upsloping curve throughout the exam. Differential: Left kidney = 93.5 % Right kidney = 6.5 % T1/2 post Lasix : Left kidney = 38.5 min Right kidney = N/A min IMPRESSION: Normal LEFT renogram. Markedly diminished renal function of a small RIGHT kidney with mild collecting system dilatation and poor clearance of tracer. Markedly asymmetric overall renal function 93.5% LEFT versus 6.5% RIGHT. Electronically Signed   By: MLavonia DanaM.D.   On: 11/27/2019 14:41   CT ABDOMEN PELVIS W CONTRAST  Result Date: 11/23/2019 CLINICAL DATA:  Nausea, vomiting, diarrhea for 3 days. Fever. Diverticulitis suspected. EXAM: CT ABDOMEN AND PELVIS WITH CONTRAST TECHNIQUE: Multidetector CT imaging of the abdomen and pelvis was performed using the standard protocol following bolus administration of intravenous contrast. CONTRAST:  732mOMNIPAQUE IOHEXOL 300 MG/ML  SOLN COMPARISON:  Abdominal CT 09/30/2019 FINDINGS: Lower chest: Minor atelectasis in the lung bases. No pleural fluid. Hepatobiliary: Cirrhotic hepatic morphology with nodular contours. No focal lesion. Postcholecystectomy without biliary dilatation. Pancreas: Parenchymal atrophy. No ductal dilatation or inflammation. Spleen: Splenomegaly with spleen measuring 14.5 AP. Adrenals/Urinary Tract: Right hydroureteronephrosis appears chronic and similar to prior exam.  Suggestion of proximal ureteric enhancement. There is mild right perinephric edema. Mild right renal parenchymal thinning. No left hydronephrosis or hydroureter. Punctate nonobstructing stone in the mid left kidney. There is delayed excretion on delayed phase imaging from the right kidney. Urinary bladder is partially distended. There is wall thickening about the right dome in left bladder base. Mild perivesicular fat stranding. Stomach/Bowel: Varices about the distal esophagus and gastroesophageal junction. Ingested material in the stomach. Normal positioning of the ligament of Treitz. No small bowel obstruction or inflammation. No terminal ileal inflammation. Appendix tentatively visualized draped posterior to the cecum. There is no evidence of appendicitis. Colonic tortuosity with small to moderate stool burden. No significant diverticular disease. No diverticulitis. Vascular/Lymphatic: Aortic atherosclerosis without aneurysm. The portal vein is patent. Varices in the left upper quadrant. Paraesophageal varices extend to the gastroesophageal junction. Multiple small retroperitoneal and periportal lymph nodes are likely reactive. Reproductive: Status post hysterectomy. No adnexal masses. Other: Trace fluid in the right pericolic gutter. No free air. No intra-abdominal abscess. Musculoskeletal: There are no acute or suspicious osseous abnormalities. Scoliotic  curvature of spine. IMPRESSION: 1. Bladder wall thickening with perivesicular fat stranding. Right hydronephrosis and proximal hydroureter with ureteral thickening and perinephric edema. Findings are suspicious for cystitis and ascending right urinary tract infection, however similar findings were also seen on prior CT in January. Recommend correlation with urinalysis. 2. Cirrhotic hepatic morphology with portal hypertension. Splenomegaly and paraesophageal and left upper quadrant varices. 3. Punctate nonobstructing stone in the left kidney. 4.  Aortic  Atherosclerosis (ICD10-I70.0). Aortic Atherosclerosis (ICD10-I70.0). Electronically Signed   By: Keith Rake M.D.   On: 11/23/2019 20:59   DG Chest Portable 1 View  Result Date: 11/23/2019 CLINICAL DATA:  Cough. Dizziness. Fever. EXAM: PORTABLE CHEST 1 VIEW COMPARISON:  Radiograph 09/30/2019 FINDINGS: Low lung volumes. Patient is rotated. Borderline cardiomegaly, likely accentuated by technique. Aortic atherosclerosis. Bronchovascular crowding versus bronchial thickening. No confluent airspace disease. No pleural effusion or pneumothorax. No acute osseous abnormalities are seen. IMPRESSION: Low lung volumes. Bronchovascular crowding versus bronchial thickening. Electronically Signed   By: Keith Rake M.D.   On: 11/23/2019 19:02      Labs: BNP (last 3 results) No results for input(s): BNP in the last 8760 hours. Basic Metabolic Panel: Recent Labs  Lab 11/23/19 1754 11/24/19 0620 11/25/19 0324 11/26/19 0501 11/27/19 0600  NA 135 139 134* 133* 132*  K 3.8 4.0 4.3 4.2 4.4  CL 106 108 105 103 100  CO2 20* 24 24 23 24   GLUCOSE 404* 119* 231* 205* 246*  BUN 17 17 19 23  25*  CREATININE 1.32* 1.01* 1.08* 1.18* 1.13*  CALCIUM 8.0* 7.9* 7.8* 8.0* 8.1*  MG  --   --  1.9 2.2 2.4   Liver Function Tests: Recent Labs  Lab 11/23/19 1754 11/25/19 0324  AST 21 34  ALT 22 24  ALKPHOS 117 120  BILITOT 0.5 0.7  PROT 6.3* 6.3*  ALBUMIN 3.1* 3.0*   No results for input(s): LIPASE, AMYLASE in the last 168 hours. No results for input(s): AMMONIA in the last 168 hours. CBC: Recent Labs  Lab 11/23/19 1754 11/25/19 0324 11/26/19 0501 11/27/19 0600  WBC 4.2 5.5 4.7 5.1  NEUTROABS 3.1 4.7  --   --   HGB 9.4* 9.7* 9.5* 9.3*  HCT 30.2* 31.7* 31.3* 31.1*  MCV 82.3 82.3 83.5 84.5  PLT 145* 140* 123* 121*   Cardiac Enzymes: No results for input(s): CKTOTAL, CKMB, CKMBINDEX, TROPONINI in the last 168 hours. BNP: Invalid input(s): POCBNP CBG: Recent Labs  Lab 11/26/19 2038  11/26/19 2358 11/27/19 0346 11/27/19 0822 11/27/19 1158  GLUCAP 250* 115* 165* 275* 302*   D-Dimer No results for input(s): DDIMER in the last 72 hours. Hgb A1c No results for input(s): HGBA1C in the last 72 hours. Lipid Profile No results for input(s): CHOL, HDL, LDLCALC, TRIG, CHOLHDL, LDLDIRECT in the last 72 hours. Thyroid function studies No results for input(s): TSH, T4TOTAL, T3FREE, THYROIDAB in the last 72 hours.  Invalid input(s): FREET3 Anemia work up No results for input(s): VITAMINB12, FOLATE, FERRITIN, TIBC, IRON, RETICCTPCT in the last 72 hours. Urinalysis    Component Value Date/Time   COLORURINE YELLOW (A) 11/23/2019 1755   APPEARANCEUR HAZY (A) 11/23/2019 1755   APPEARANCEUR Cloudy (A) 08/24/2019 1104   LABSPEC 1.015 11/23/2019 1755   LABSPEC 1.033 10/08/2014 0144   PHURINE 6.0 11/23/2019 1755   GLUCOSEU >=500 (A) 11/23/2019 1755   GLUCOSEU >=500 10/08/2014 0144   HGBUR LARGE (A) 11/23/2019 [place] 11/23/2019 1755   BILIRUBINUR Negative 11/04/2019 1157  BILIRUBINUR Negative 08/24/2019 1104   BILIRUBINUR Negative 10/08/2014 0144   KETONESUR NEGATIVE 11/23/2019 1755   PROTEINUR 30 (A) 11/23/2019 1755   UROBILINOGEN 0.2 11/04/2019 1157   UROBILINOGEN 0.2 05/27/2015 1936   NITRITE NEGATIVE 11/23/2019 1755   LEUKOCYTESUR TRACE (A) 11/23/2019 1755   LEUKOCYTESUR Negative 10/08/2014 0144   Sepsis Labs Invalid input(s): PROCALCITONIN,  WBC,  LACTICIDVEN Microbiology Recent Results (from the past 240 hour(s))  Blood culture (routine x 2)     Status: None (Preliminary result)   Collection Time: 11/23/19  5:55 PM   Specimen: BLOOD  Result Value Ref Range Status   Specimen Description BLOOD LEFT ANTECUBITAL  Final   Special Requests   Final    BOTTLES DRAWN AEROBIC AND ANAEROBIC Blood Culture adequate volume   Culture   Final    NO GROWTH 4 DAYS Performed at [facility], [address], [place], [place] [address]    Report  Status PENDING  Incomplete  Urine culture     Status: Abnormal   Collection Time: 11/23/19  5:55 PM   Specimen: Urine, Random  Result Value Ref Range Status   Specimen Description   Final    URINE, RANDOM Performed at [facility], [address], [place], [place] [address]    Special Requests   Final    NONE Performed at [facility], [address] [place], [place] [address]    Culture (A)  Final    <10,000 COLONIES/mL INSIGNIFICANT GROWTH Performed at [facility], [place] [address] [place], [place] [address]    Report Status 11/24/2019 FINAL  Final  Blood culture (routine x 2)     Status: None (Preliminary result)   Collection Time: 11/24/19  6:20 AM   Specimen: BLOOD  Result Value Ref Range Status   Specimen Description BLOOD LEFT HAND  Final   Special Requests   Final    BOTTLES DRAWN AEROBIC AND ANAEROBIC Blood Culture adequate volume   Culture   Final    NO GROWTH 3 DAYS Performed at [facility], [address], [place], [place] [address]    Report Status PENDING  Incomplete  SARS CORONAVIRUS 2 (TAT 6-24 HRS) Nasopharyngeal Nasopharyngeal Swab     Status: None   Collection Time: 11/24/19  8:56 AM   Specimen: Nasopharyngeal Swab  Result Value Ref Range Status   SARS Coronavirus 2 NEGATIVE NEGATIVE Final    Comment: (NOTE) SARS-CoV-2 target nucleic acids are NOT DETECTED. The SARS-CoV-2 RNA is generally detectable in upper and lower respiratory specimens during the acute phase of infection. Negative results do not preclude SARS-CoV-2 infection, do not rule out [name]-infections with other pathogens, and should not be used as the sole basis for treatment or other patient management decisions. Negative results must be combined with clinical observations, patient history, and epidemiological information. The expected result is Negative. Fact Sheet for Patients: SugarRoll.be Fact Sheet for Healthcare  Providers: https://www.woods-mathews.com/ This test is not yet approved or cleared by the [place] FDA and  has been authorized for detection and/or diagnosis of SARS-CoV-2 by FDA under an Emergency Use Authorization (EUA). This EUA will remain  in effect (meaning this test can be used) for the duration of the COVID-19 declaration under Section 56 4(b)(1) of the Act, 21 U.S.C. section 360bbb-3(b)(1), unless the authorization is terminated or revoked sooner. Performed at [facility], [place] [address], [place], [place] [address]      Total time spend on discharging  this patient, including the last patient exam, discussing the hospital stay, instructions for ongoing care as it relates to all pertinent caregivers, as well as preparing the medical discharge records, prescriptions, and/or referrals as applicable, is 40 minutes.    Enzo Bi, MD  [facility] 11/27/2019, 3:40 PM  If 7PM-7AM, please contact night-coverage

## 2019-11-27 NOTE — Plan of Care (Signed)
°  Problem: Coping: °Goal: Level of anxiety will decrease °Outcome: Progressing °  °

## 2019-11-27 NOTE — Progress Notes (Signed)
[name] to be D/C'd Home per MD order.  Discussed prescriptions and follow up appointments with the patient. Prescriptions were e-prescribed, medication list explained in detail. Pt verbalized understanding.  Allergies as of 11/27/2019      Reactions   Aspirin Rash   Codeine Sulfate Rash   Erythromycin Rash   Reaction:  Fever    Prednisone Swelling   Rosiglitazone Maleate Swelling   Tetanus-diphtheria Toxoids Td Rash, Other (See Comments)   Reaction:  Fever       Medication List    STOP taking these medications   midodrine 5 MG tablet Commonly known as: PROAMATINE     TAKE these medications   Accu-Chek Aviva Plus test strip Generic drug: glucose blood Use as instructed to test blood sugar up to 4 times daily   albuterol 108 (90 Base) MCG/ACT inhaler Commonly known as: VENTOLIN HFA Inhale 2 puffs into the lungs every 6 (six) hours as needed for wheezing or shortness of breath.   atorvastatin 40 MG tablet Commonly known as: LIPITOR Take 40 mg by mouth daily.   cholecalciferol 25 MCG (1000 UNIT) tablet Commonly known as: VITAMIN D3 Take 1,000 Units by mouth daily.   cyclobenzaprine 10 MG tablet Commonly known as: FLEXERIL Take 1 tablet (10 mg total) by mouth 3 (three) times daily as needed for muscle spasms. Must last 30 days.   insulin detemir 100 UNIT/ML FlexPen Commonly known as: LEVEMIR Inject 30-34 Units into the skin See admin instructions. Inject 34u under the skin every morning and inject 30u under the skin every night   magnesium gluconate 500 MG tablet Commonly known as: MAGONATE Take 500 mg by mouth 2 (two) times daily.   nitroGLYCERIN 0.3 MG SL tablet Commonly known as: Nitrostat Place 1 tablet (0.3 mg total) under the tongue every 5 (five) minutes as needed for chest pain.   ondansetron 4 MG disintegrating tablet Commonly known as: Zofran ODT Take 1 tablet (4 mg total) by mouth every 8 (eight) hours as needed for nausea or vomiting.    oxyCODONE 5 MG immediate release tablet Commonly known as: Oxy IR/ROXICODONE Take 1 tablet (5 mg total) by mouth every 4 (four) hours as needed for severe pain. Must last 30 days. Max: 6/day What changed: Another medication with the same name was removed. Continue taking this medication, and follow the directions you see here.   oxyCODONE 5 MG immediate release tablet Commonly known as: Oxy IR/ROXICODONE Take 1 tablet (5 mg total) by mouth every 4 (four) hours as needed for severe pain. Must last 30 days. Max: 6/day Start taking on: December 10, 2019 What changed: Another medication with the same name was removed. Continue taking this medication, and follow the directions you see here.   sertraline 50 MG tablet Commonly known as: ZOLOFT Take 1 tablet (50 mg total) by mouth daily. For anxiety.   solifenacin 10 MG tablet Commonly known as: VESICARE Take 1 tablet (10 mg total) by mouth daily. For pain with urination.       Vitals:   11/27/19 1625 11/27/19 1650  BP: 129/68   Pulse: (!) 113 (!) 102  Resp: 16   Temp: 98.5 F (36.9 C)   SpO2: 94%     Tele box removed and returned. Skin clean, dry and intact without evidence of skin break down, no evidence of skin tears noted. IV catheter discontinued intact. Site without signs and symptoms of complications. Dressing and pressure applied. Pt denies pain at this time. No  complaints noted.  An After Visit Summary was printed and given to the patient. Patient escorted via [place], and D/C home via [employer].  Rolley Sims

## 2019-11-27 NOTE — Progress Notes (Signed)
Inpatient Diabetes Program Recommendations  AACE/ADA: New Consensus Statement on Inpatient Glycemic Control   Target Ranges:  Prepandial:   less than 140 mg/dL      Peak postprandial:   less than 180 mg/dL (1-2 hours)      Critically ill patients:  140 - 180 mg/dL   Results for [name], [name] (MRN [id number]) as of 11/27/2019 11:00  Ref. Range 11/26/2019 08:29 11/26/2019 11:42 11/26/2019 16:11 11/26/2019 20:38 11/26/2019 23:58 11/27/2019 03:46 11/27/2019 08:22  Glucose-Capillary Latest Ref Range: 70 - 99 mg/dL 234 (H) 222 (H) 202 (H) 250 (H) 115 (H) 165 (H) 275 (H)   Review of Glycemic Control  Current orders for Inpatient glycemic control: Levemir 34 units QAM, Levemir 30 units QHS, Novolog 0-9 units TID with meals  Inpatient Diabetes Program Recommendations:    Insulin-Basal: Please consider increasing morning Levemir to 36 units QAM.  Insulin-Meal Coverage: Please consider ordering Novolog 4 units TID with meals for meal coverage if patient eats at least 50% of meals.  Insulin-Correction: Please consider ordering Novolog 0-5 units QHS for bedtime correction.  Thanks, Barnie Alderman, RN, MSN, CDE Diabetes Coordinator Inpatient Diabetes Program [phone] (Team Pager from 8am to 5pm)

## 2019-11-28 LAB — CULTURE, BLOOD (ROUTINE X 2)
Culture: NO GROWTH
Special Requests: ADEQUATE

## 2019-11-28 NOTE — Care Management (Signed)
Late note entry/post discharge note entry: Notified by Corene Cornea with [facility] that patient is followed by them for PT. They are requesting to add OT and Education officer, museum. I have sent message to discharging MD with this request.

## 2019-11-29 LAB — CULTURE, BLOOD (ROUTINE X 2)
Culture: NO GROWTH
Special Requests: ADEQUATE

## 2019-11-30 ENCOUNTER — Telehealth: Payer: Self-pay | Admitting: Urology

## 2019-11-30 ENCOUNTER — Telehealth: Payer: Self-pay | Admitting: *Deleted

## 2019-11-30 NOTE — Telephone Encounter (Signed)
done

## 2019-11-30 NOTE — Telephone Encounter (Signed)
Approved.  

## 2019-11-30 NOTE — Telephone Encounter (Signed)
-----   Message from Billey Co, MD sent at 11/27/2019  3:27 PM EDT ----- Regarding: follow up Please schedule clinic cysto on Wednesday 3/24, ok to overbook. If she cannot come then, next week is ok, thanks  Nickolas Madrid, MD 11/27/2019

## 2019-11-30 NOTE — Telephone Encounter (Signed)
[name] with [facility] called stating that patient has been discharged from the hospital. [name] is requesting to resume home health and requested orders for PT. OT and social worker. [name] requested a call back with verbal orders and hit option 2.

## 2019-12-01 ENCOUNTER — Ambulatory Visit: Payer: Medicaid Other | Admitting: Primary Care

## 2019-12-01 NOTE — Telephone Encounter (Signed)
[name] has been notified of verbal orders.

## 2019-12-02 ENCOUNTER — Other Ambulatory Visit: Payer: Self-pay

## 2019-12-02 ENCOUNTER — Telehealth: Payer: Self-pay | Admitting: Primary Care

## 2019-12-02 ENCOUNTER — Encounter: Payer: Self-pay | Admitting: Urology

## 2019-12-02 ENCOUNTER — Ambulatory Visit (INDEPENDENT_AMBULATORY_CARE_PROVIDER_SITE_OTHER): Payer: Medicaid Other | Admitting: Urology

## 2019-12-02 VITALS — BP 165/95 | HR 115 | Ht 63.0 in | Wt 161.0 lb

## 2019-12-02 DIAGNOSIS — N329 Bladder disorder, unspecified: Secondary | ICD-10-CM

## 2019-12-02 DIAGNOSIS — N200 Calculus of kidney: Secondary | ICD-10-CM

## 2019-12-02 DIAGNOSIS — N1330 Unspecified hydronephrosis: Secondary | ICD-10-CM

## 2019-12-02 DIAGNOSIS — Z87440 Personal history of urinary (tract) infections: Secondary | ICD-10-CM

## 2019-12-02 MED ORDER — LIDOCAINE HCL URETHRAL/MUCOSAL 2 % EX GEL
1.0000 "application " | Freq: Once | CUTANEOUS | Status: AC
  Administered 2019-12-02: 1 via URETHRAL

## 2019-12-02 NOTE — Telephone Encounter (Signed)
Stacy wanted to let you know 1) verbal approval to continue PT and new recertification  2) discharged paperwork does not have pt taking metoprolol and elquiss  Does she needs to restart  Vitals are good pt has appointment with you next week

## 2019-12-02 NOTE — Telephone Encounter (Signed)
Stacy advised

## 2019-12-02 NOTE — Progress Notes (Signed)
Cystoscopy Procedure Note:  Indication: Bladder thickening on CT  She is a complex and comorbid 64 year old female with history of a right-sided ureteral stone treated by Dr. Tresa Moore in 2017 was lost to follow-up with a retained right ureteral stent.  She had multiple UTIs and episodes of pyelonephritis with a stent in place.  Her stent was ultimately removed in clinic on 08/24/2019.  She has had multiple hospitalizations over the last few months with vague abdominal pain and left-sided abdominal pain, with CT showing mild right-sided hydroureteronephrosis.  Urine cultures and blood cultures have all been negative.  A NM renal scan on 11/27/2019 showed poor clearance of tracer from the right kidney with hydronephrosis and diminished renal function of 6.5%.  After informed consent and discussion of the procedure and its risks, RUMI KOLODZIEJ was positioned and prepped in the standard fashion. Cystoscopy was performed with a flexible cystoscope. The urethra, bladder neck and entire bladder was visualized in a standard fashion. Urine was slightly cloudy.  The ureteral orifices were visualized in their normal location and orientation.  There were large patches of erythema at the left lateral bladder wall worrisome for possible carcinoma in situ.  No papillary tumors.  No abnormalities on retroflexion.  Urine sent for cytology, as well as culture.  Findings: Multiple patches of erythema worrisome for carcinoma in situ  --------------------------------------------------------------------------------------------------  Assessment and Plan: She is a very complex and comorbid 64 year old female with history noted above.  She has been hospitalized with abdominal pain, left-sided flank pain, and moderate to severe dysuria multiple times over the last few months.  She reports persistent bothersome dysuria as well as abdominal and left-sided pain.  She really does not have any significant right-sided flank pain,  despite having persistent mild right-sided hydroureteronephrosis after having a retained stent removed in December 2020 that had been in place for 3 years after she was lost to follow-up.  All urine cultures have been negative since stent removal.  I recommended proceeding with cystoscopy, bladder biopsy and fulguration of suspicious bladder lesions, as well as bilateral retrograde pyelograms and possible right diagnostic ureteroscopy to evaluate for stricture versus reflux as the etiology of her right-sided mild hydroureteronephrosis.  The risks and benefits were discussed at length.  We reviewed her complex history and unclear etiology of her abdominal and left-sided pain, as well as possible treatment options for her persistent mild right-sided hydroureteronephrosis.  This is typically a 1 to 2-hour procedure done under general anesthesia in the operating room.  A scope is inserted through the urethra and used to biopsy abnormal tissue within the bladder, which is then sent to the pathologist to determine grade and stage of the tumor.  Risks include bleeding, infection, need for temporary Foley placement, and bladder perforation.  Treatment strategies are based on the type of tumor and depth of invasion.  We briefly reviewed the different treatment pathways for non-muscle invasive and muscle invasive bladder cancer.  -Schedule cystoscopy, bladder biopsy and fulguration, bilateral retrograde pyelograms, possible right diagnostic ureteroscopy -Follow-up urine cytology -Follow-up urine culture  I spent 30 total minutes on the day of the encounter including pre-visit review of the medical record, face-to-face time with the patient, and post visit ordering of labs/imaging/tests.   Nickolas Madrid, MD 12/02/2019

## 2019-12-02 NOTE — Patient Instructions (Signed)
Bladder Biopsy A bladder biopsy is a procedure to remove a small sample of tissue from the bladder. The procedure is done so that the tissue can be examined under a microscope. You may have a bladder biopsy to diagnose or rule out cancer of the bladder. During a bladder biopsy, your health care provider may insert a long, thin scope with a lighted camera (cystoscope) into the urethra and move it into your bladder. The cystoscope will allow your health care provider to check the lining of the urethra and bladder and remove the tissue sample. If your health care provider also needs to check your ureters, a longer tube (ureteroscope) may be used. Tell your health care provider about:  Any allergies you have.  All medicines you are taking, including vitamins, herbs, eye drops, creams, and over-the-counter medicines.  Any problems you or family members have had with anesthetic medicines.  Any blood disorders you have.  Any surgeries you have had.  Any medical conditions you have.  Whether you are pregnant or may be pregnant. What are the risks? Generally, this is a safe procedure. However, problems may occur, including:  Bleeding.  Infection, especially a urinary tract infection (UTI).  Allergic reactions to medicines.  Damage to nearby structures or organs, including the urethra, bladder, or ureters.  Abdominal pain.  Burning or pain during urination.  Narrowing of the urethra due to scar tissue.  Difficulty urinating due to swelling. What happens before the procedure? Medicines Ask your health care provider about:  Changing or stopping your regular medicines. This is especially important if you are taking diabetes medicines or blood thinners.  Taking medicines such as aspirin and ibuprofen. These medicines can thin your blood. Do not take these medicines unless your health care provider tells you to take them.  Taking over-the-counter medicines, vitamins, herbs, and  supplements. Surgery safety Ask your health care provider:  How your surgery site will be marked.  What steps will be taken to help prevent infection. These may include: ? Removing hair at the surgery site. ? Washing skin with a germ-killing soap. ? Receiving antibiotic medicine. General instructions  Follow instructions from your health care provider about eating and drinking restrictions.  You may be asked to drink plenty of fluids.  You may be asked to urinate right before the procedure. You may have a urine sample taken for UTI testing.  Plan to have someone take you home from the hospital or clinic.  If you will be going home right after the procedure, plan to have someone with you for 24 hours. What happens during the procedure?   An IV may be inserted into one of your veins.  You may be given one or more of the following: ? A medicine to help you relax (sedative). ? A medicine to numb the opening of the urethra (local anesthetic). ? A medicine to make you fall asleep (general anesthetic).  You will lie on your back with your knees bent and spread apart.  The cystoscope or ureteroscope will be inserted into your urethra and guided into your bladder or ureters.  Your bladder may be slowly filled with germ-free (sterile) water. This will make it easier for your health care provider to view the wall or lining of your bladder.  Small instruments will be inserted through the scope to collect a small tissue sample that will be examined under a microscope. The procedure may vary among health care providers and hospitals. What happens after the procedure?  Your blood pressure, heart rate, breathing rate, and blood oxygen level will be monitored until you leave the hospital or clinic.  You may be asked to empty your bladder, or your bladder may be emptied for you.  Do not drive for 24 hours if you received a sedative. Summary  A bladder biopsy is a procedure to remove a  small sample of tissue from the bladder.  You may have a bladder biopsy to diagnose or rule out cancer of the bladder.  Follow instructions from your health care provider about eating and drinking restrictions.  Ask your health care provider if you need to stop or change any medicines that you are taking.  If you will be going home right after the procedure, plan to have someone with you for 24 hours. This information is not intended to replace advice given to you by your health care provider. Make sure you discuss any questions you have with your health care provider. Document Revised: 03/04/2019 Document Reviewed: 03/04/2019 Elsevier Patient Education  [place].

## 2019-12-02 NOTE — Addendum Note (Signed)
Addended by: Donalee Citrin on: 12/02/2019 12:22 PM   Modules accepted: Orders

## 2019-12-02 NOTE — Telephone Encounter (Signed)
Noted. Correct, she is no longer on metoprolol and Eliqus.

## 2019-12-03 ENCOUNTER — Other Ambulatory Visit: Payer: Self-pay

## 2019-12-03 ENCOUNTER — Other Ambulatory Visit: Payer: Self-pay | Admitting: Urology

## 2019-12-03 ENCOUNTER — Encounter
Admission: RE | Admit: 2019-12-03 | Discharge: 2019-12-03 | Disposition: A | Discharge status: Home / Self Care | Payer: Medicare Other | Source: Ambulatory Visit | Attending: Urology | Admitting: Urology

## 2019-12-03 DIAGNOSIS — N329 Bladder disorder, unspecified: Secondary | ICD-10-CM

## 2019-12-03 HISTORY — DX: Angina pectoris, unspecified: I20.9

## 2019-12-03 LAB — URINALYSIS, COMPLETE
Bilirubin, UA: NEGATIVE
Ketones, UA: NEGATIVE
Nitrite, UA: NEGATIVE
Specific Gravity, UA: 1.02 (ref 1.005–1.030)
Urobilinogen, Ur: 0.2 mg/dL (ref 0.2–1.0)
pH, UA: 6.5 (ref 5.0–7.5)

## 2019-12-03 LAB — MICROSCOPIC EXAMINATION
Epithelial Cells (non renal): >10 /hpf — AB (ref 0–10)
RBC, Urine: >30 /hpf — AB (ref 0–2)

## 2019-12-03 NOTE — Patient Instructions (Signed)
Your procedure is scheduled on: 12/11/19 Report to [place]. To find out your arrival time please call [phone] between 1PM - 3PM on 12/10/19.  Remember: Instructions that are not followed completely may result in serious medical risk, up to and including death, or upon the discretion of your surgeon and anesthesiologist your surgery may need to be rescheduled.     _X__ 1. Do not eat food after midnight the night before your procedure.                 No gum chewing or hard candies. You may drink clear liquids up to 2 hours                 before you are scheduled to arrive for your surgery- DO not drink clear                 liquids within 2 hours of the start of your surgery.                 Clear Liquids include:  water, apple juice without pulp, clear carbohydrate                 drink such as Clearfast or Gatorade, Black Coffee or Tea (Do not add                 anything to coffee or tea). Diabetics water only  __X__2.  On the morning of surgery brush your teeth with toothpaste and water, you                 may rinse your mouth with mouthwash if you wish.  Do not swallow any              toothpaste of mouthwash.     _X__ 3.  No Alcohol for 24 hours before or after surgery.   _X__ 4.  Do Not Smoke or use e-cigarettes For 24 Hours Prior to Your Surgery.                 Do not use any chewable tobacco products for at least 6 hours prior to                 surgery.  ____  5.  Bring all medications with you on the day of surgery if instructed.   __X__  6.  Notify your doctor if there is any change in your medical condition      (cold, fever, infections).     Do not wear jewelry, make-up, hairpins, clips or nail polish. Do not wear lotions, powders, or perfumes.  Do not shave 48 hours prior to surgery. Men may shave face and neck. Do not bring valuables to the hospital.    [facility] is not responsible for any belongings or  valuables.  Contacts, dentures/partials or body piercings may not be worn into surgery. Bring a case for your contacts, glasses or hearing aids, a denture cup will be supplied. Leave your suitcase in the car. After surgery it may be brought to your room. For patients admitted to the hospital, discharge time is determined by your treatment team.   Patients discharged the day of surgery will not be allowed to drive home.   Please read over the following fact sheets that you were given:   MRSA Information  __X__ Take these medicines the morning of surgery with A SIP OF WATER:  1. magnesium gluconate (MAGONATE) 500 MG tablet  2. oxyCODONE (OXY IR/ROXICODONE) 5 MG immediate release tablet  3.   4.  5.  6.  ____ Fleet Enema (as directed)   ____ Use CHG Soap/SAGE wipes as directed  __X__ Use inhalers on the day of surgery  ____ Stop metformin/Janumet/Farxiga 2 days prior to surgery    __X__ Take 1/2 of usual insulin dose the night before surgery. No insulin the morning          of surgery.   ____ Stop Blood Thinners Coumadin/Plavix/Xarelto/Pleta/Pradaxa/Eliquis/Effient/Aspirin  on   Or contact your Surgeon, Cardiologist or Medical Doctor regarding  ability to stop your blood thinners  __X__ Stop Anti-inflammatories 7 days before surgery such as Advil, Ibuprofen, Motrin,  BC or Goodies Powder, Naprosyn, Naproxen, Aleve, Aspirin    __X__ Stop all herbal supplements, fish oil or vitamin E until after surgery.    ____ Bring C-Pap to the hospital.

## 2019-12-05 ENCOUNTER — Other Ambulatory Visit: Payer: Self-pay

## 2019-12-05 ENCOUNTER — Emergency Department: Payer: Medicare Other

## 2019-12-05 ENCOUNTER — Emergency Department
Admission: EM | Admit: 2019-12-05 | Discharge: 2019-12-05 | Disposition: A | Discharge status: Home / Self Care | Payer: Medicare Other | Attending: Emergency Medicine | Admitting: Emergency Medicine

## 2019-12-05 DIAGNOSIS — E119 Type 2 diabetes mellitus without complications: Secondary | ICD-10-CM | POA: Insufficient documentation

## 2019-12-05 DIAGNOSIS — E039 Hypothyroidism, unspecified: Secondary | ICD-10-CM | POA: Diagnosis not present

## 2019-12-05 DIAGNOSIS — Z8542 Personal history of malignant neoplasm of other parts of uterus: Secondary | ICD-10-CM | POA: Diagnosis not present

## 2019-12-05 DIAGNOSIS — I10 Essential (primary) hypertension: Secondary | ICD-10-CM | POA: Insufficient documentation

## 2019-12-05 DIAGNOSIS — Z79899 Other long term (current) drug therapy: Secondary | ICD-10-CM | POA: Insufficient documentation

## 2019-12-05 DIAGNOSIS — Z87891 Personal history of nicotine dependence: Secondary | ICD-10-CM | POA: Insufficient documentation

## 2019-12-05 DIAGNOSIS — N939 Abnormal uterine and vaginal bleeding, unspecified: Secondary | ICD-10-CM | POA: Diagnosis present

## 2019-12-05 DIAGNOSIS — R3989 Other symptoms and signs involving the genitourinary system: Secondary | ICD-10-CM | POA: Insufficient documentation

## 2019-12-05 DIAGNOSIS — Z794 Long term (current) use of insulin: Secondary | ICD-10-CM | POA: Diagnosis not present

## 2019-12-05 DIAGNOSIS — Z8673 Personal history of transient ischemic attack (TIA), and cerebral infarction without residual deficits: Secondary | ICD-10-CM | POA: Insufficient documentation

## 2019-12-05 DIAGNOSIS — I2510 Atherosclerotic heart disease of native coronary artery without angina pectoris: Secondary | ICD-10-CM | POA: Diagnosis not present

## 2019-12-05 LAB — BASIC METABOLIC PANEL
Anion gap: 10 (ref 5–15)
BUN: 22 mg/dL (ref 8–23)
CO2: 21 mmol/L — ABNORMAL LOW (ref 22–32)
Calcium: 8.9 mg/dL (ref 8.9–10.3)
Chloride: 100 mmol/L (ref 98–111)
Creatinine, Ser: 1.43 mg/dL — ABNORMAL HIGH (ref 0.44–1.00)
GFR calc Af Amer: 44 mL/min — ABNORMAL LOW (ref >60)
GFR calc non Af Amer: 38 mL/min — ABNORMAL LOW (ref >60)
Glucose, Bld: 489 mg/dL — ABNORMAL HIGH (ref 70–99)
Potassium: 3.8 mmol/L (ref 3.5–5.1)
Sodium: 131 mmol/L — ABNORMAL LOW (ref 135–145)

## 2019-12-05 LAB — CBC WITH DIFFERENTIAL/PLATELET
Abs Immature Granulocytes: 0.02 10*3/uL (ref 0.00–0.07)
Basophils Absolute: 0 10*3/uL (ref 0.0–0.1)
Basophils Relative: 0 %
Eosinophils Absolute: 0.1 10*3/uL (ref 0.0–0.5)
Eosinophils Relative: 2 %
HCT: 31.9 % — ABNORMAL LOW (ref 36.0–46.0)
Hemoglobin: 9.6 g/dL — ABNORMAL LOW (ref 12.0–15.0)
Immature Granulocytes: 0 %
Lymphocytes Relative: 16 %
Lymphs Abs: 0.7 10*3/uL (ref 0.7–4.0)
MCH: 25.3 pg — ABNORMAL LOW (ref 26.0–34.0)
MCHC: 30.1 g/dL (ref 30.0–36.0)
MCV: 84.2 fL (ref 80.0–100.0)
Monocytes Absolute: 0.3 10*3/uL (ref 0.1–1.0)
Monocytes Relative: 7 %
Neutro Abs: 3.3 10*3/uL (ref 1.7–7.7)
Neutrophils Relative %: 75 %
Platelets: 145 10*3/uL — ABNORMAL LOW (ref 150–400)
RBC: 3.79 MIL/uL — ABNORMAL LOW (ref 3.87–5.11)
RDW: 16.1 % — ABNORMAL HIGH (ref 11.5–15.5)
WBC: 4.5 10*3/uL (ref 4.0–10.5)
nRBC: 0 % (ref 0.0–0.2)

## 2019-12-05 LAB — HEPATIC FUNCTION PANEL
ALT: 29 U/L (ref 0–44)
AST: 24 U/L (ref 15–41)
Albumin: 3.6 g/dL (ref 3.5–5.0)
Alkaline Phosphatase: 119 U/L (ref 38–126)
Bilirubin, Direct: 0.1 mg/dL (ref 0.0–0.2)
Indirect Bilirubin: 0.5 mg/dL (ref 0.3–0.9)
Total Bilirubin: 0.6 mg/dL (ref 0.3–1.2)
Total Protein: 7.1 g/dL (ref 6.5–8.1)

## 2019-12-05 LAB — LIPASE, BLOOD: Lipase: 20 U/L (ref 11–51)

## 2019-12-05 LAB — TYPE AND SCREEN
ABO/RH(D): A POS
Antibody Screen: NEGATIVE

## 2019-12-05 MED ORDER — IOHEXOL 300 MG/ML  SOLN
75.0000 mL | Freq: Once | INTRAMUSCULAR | Status: AC | PRN
  Administered 2019-12-05: 75 mL via INTRAVENOUS

## 2019-12-05 MED ORDER — ONDANSETRON HCL 4 MG/2ML IJ SOLN: 4.0000 mg | INTRAMUSCULAR | Status: DC

## 2019-12-05 MED ORDER — SODIUM CHLORIDE 0.9 % IV BOLUS
500.0000 mL | Freq: Once | INTRAVENOUS | Status: AC
  Administered 2019-12-05: 500 mL via INTRAVENOUS

## 2019-12-05 MED ORDER — MORPHINE SULFATE (PF) 4 MG/ML IV SOLN
4.0000 mg | Freq: Once | INTRAVENOUS | Status: AC
  Administered 2019-12-05: 04:00:00 4 mg via INTRAVENOUS
  Filled 2019-12-05: qty 1

## 2019-12-05 NOTE — Discharge Instructions (Addendum)
As we discussed, your work-up is reassuring tonight and there is no sign of any acute or emergent complication from your recent cystoscopy.  Please follow-up with your urologist, Dr. Diamantina Providence, as planned to continue working up the probability of cancer.  He will likely get you involved with some additional specialists.  Take your regular medications as prescribed.  Follow-up with your pain doctor as well.  Drink plenty of fluids so that you stay hydrated.  Return to the emergency department if you develop new or worsening symptoms that concern you.

## 2019-12-05 NOTE — ED Notes (Signed)
Pt ambulatory to restroom without assistance. Pt getting dressed at this time.

## 2019-12-05 NOTE — ED Provider Notes (Signed)
[facility] Emergency Department Provider Note  ____________________________________________   First MD Initiated Contact with Patient 12/05/19 0250     (approximate)  I have reviewed the triage vital signs and the nursing notes.   HISTORY  Chief Complaint blleeding from scope site (vaginal bleeding)    HPI [name] is a 64 y.o. female with a complicated medical history as listed below which notably includes recent cystoscopy by Dr. Diamantina Providence, the findings of which were concerning for cancer in her bladder.  This was done as an outpatient procedure but she is being scheduled for more extensive procedure under general anesthesia that will involve multiple biopsies from different sites.  She presents tonight by EMS for worsening pain and bleeding from the urethra (originally described as "vaginal bleeding").    She reports that she has had pain in that area since her scope 3 days ago but it is gradually gotten worse.  By tonight it was severe and when she wipes she sees blood.  She is in so much pain that moving around causes a great deal of discomfort.  She is able to urinate but it hurts to do so.  She has had no nausea or vomiting.  She denies fever, chest pain, shortness of breath, and any abdominal pain other than in her pelvis.  Nothing in particular makes the pain better and she describes it as severe, sharp, and stabbing.        Past Medical History:  Diagnosis Date  . Acute encephalopathy 05/22/2018  . Allergy   . Anginal pain ([place])   . Anxiety   . Ascites   . C. difficile colitis 07/10/2015  . Cancer (HCC)    HX OF CANCER OF UTERUS   . Cirrhosis of liver not due to alcohol ([place]) 2016  . Degenerative disk disease   . Diverticulitis   . Gastroparesis   . GERD (gastroesophageal reflux disease)   . History of hiatal hernia   . Hypertension   . Hypothyroid   . Hypothyroidism due to amiodarone   . Ileus ([place]) 08/01/2015  . Intussusception  intestine ([place]) 05/2015  . Orthostatic hypotension   . PAF (paroxysmal atrial fibrillation) ([place]) 03/2015   a. new onset 03/2015 in setting of intractable N/V; b. on Eliquis 5 mg bid; c. CHADSVASc 4 (DM, TIA x 2, female)  . Pancreatitis   . Pneumonia 11/14/2015  . Right ureteral stone 07/14/2016  . Sepsis ([place]) 12/15/2017  . Sick sinus syndrome ([place])   . Stomach ulcer   . Stroke [facility])    with minimal left sided weakness  . Syncope 01/2015  . Syncope due to orthostatic hypotension 05/18/2015  . Tachyarrhythmia 01/10/2016  . TIA (transient ischemic attack) 02/2015  . Type 1 diabetes ([place])    on levemir  . UTERINE CANCER, HX OF 03/27/2007   Qualifier: Diagnosis of  By: Maxie Better FNP, Rosalita Levan   . UTI (urinary tract infection) 05/22/2018    Patient Active Problem List   Diagnosis Date Noted  . Sepsis ([place]) 11/23/2019  . Acute pyelonephritis 09/30/2019  . Acute pain of left foot 08/18/2019  . Type II diabetes mellitus with renal manifestations ([place]) 08/13/2019  . Weakness of both lower extremities 07/31/2019  . Altered mental status 07/14/2019  . GAD (generalized anxiety disorder) 06/11/2019  . Poor memory 06/11/2019  . Constipation 05/25/2019  . Acute medial meniscus tear of left knee 05/25/2019  . AKI (acute kidney injury) ([place]) 05/16/2019  . Closed fracture  of left distal fibula 05/16/2019  . Sinus tachycardia 05/05/2019  . Congestive dilated cardiomyopathy ([place]) 04/14/2019  . CAD (coronary artery disease) 04/10/2019  . Closed fracture of lateral malleolus 04/10/2019  . Lumbar facet syndrome (Bilateral) (R>L) 03/09/2019  . Long term (current) use of anticoagulants 03/09/2019  . Unspecified cirrhosis of liver ([place]) 02/11/2019  . Hypoalbuminemia 02/11/2019  . Edema due to hypoalbuminemia 02/11/2019  . Lower extremity edema 01/26/2019  . Prolonged QT interval 12/17/2018  . Hypomagnesemia 12/17/2018  . Uncontrolled type 2 diabetes mellitus ([place]) 12/17/2018  . H/O TIA (transient  ischemic attack) and stroke 12/17/2018  . Personal history of surgery to heart and great vessels, presenting hazards to health 12/17/2018  . DDD (degenerative disc disease), lumbar 12/17/2018  . Lumbar intervertebral disc displacement 12/17/2018  . Chronic musculoskeletal pain 12/17/2018  . Chronic generalized pain 12/17/2018  . Hyponatremia 12/17/2018  . Fibromyalgia syndrome 12/17/2018  . Other intervertebral disc displacement, lumbar region 12/17/2018  . Vitamin D deficiency 11/26/2018  . Elevated C-reactive protein (CRP) 11/26/2018  . Elevated sed rate 11/26/2018  . Chronic low back pain (Primary area of Pain) (Bilateral) (L>R) w/ sciatica (Bilateral) 11/25/2018  . Chronic lower extremity pain (Secondary Area of Pain) (Bilateral) (L>R) 11/25/2018  . Chronic neck pain 11/25/2018  . Pharmacologic therapy 11/25/2018  . Disorder of skeletal system 11/25/2018  . Problems influencing health status 11/25/2018  . Renal mass 10/06/2018  . Chronic intermittent abdominal pain 09/25/2018  . Unspecified abdominal pain 09/25/2018  . Frequent falls 09/12/2018  . Orthostatic hypotension 08/18/2018  . Normochromic normocytic anemia 08/18/2018  . Acute renal failure superimposed on stage 3a chronic kidney disease ([place]) 08/18/2018  . History of hypothyroidism 08/14/2018  . Medical non-compliance 08/14/2018  . Diabetic ketoacidosis ([place]) 05/22/2018  . UTI (urinary tract infection) 05/22/2018  . NSTEMI (non-ST elevated myocardial infarction) ([place]) 01/11/2016  . Tachy-brady syndrome ([place]) 12/28/2015  . PAF (paroxysmal atrial fibrillation) ([place]) 11/24/2015  . Type 2 diabetes mellitus with neurologic complication ([place]) 82/99/3716  . S/P cholecystectomy 11/23/2015  . Narcotic abuse ([place]) 11/22/2015  . Chronic superficial gastritis without bleeding 11/22/2015  . History of Clostridium difficile 11/22/2015  . History of TIA (transient ischemic attack) 11/22/2015  . Mixed hyperlipidemia 11/22/2015  .  Diverticulosis 11/22/2015  . History of uterine cancer 11/22/2015  . Hypothyroidism, unspecified 11/22/2015  . Intractable cyclical vomiting with nausea 11/22/2015  . Narcotic withdrawal ([place]) 11/11/2015  . Hyperlipidemia 08/31/2015  . Hypotension 08/06/2015  . Cryptogenic cirrhosis ([place]) 07/10/2015  . Atrial fibrillation ([place]) 07/10/2015  . Malnutrition of moderate degree 07/04/2015  . Symptomatic bradycardia 05/27/2015  . Chronic anemia 05/19/2015  . Thrombocytopenia ([place]) 05/19/2015  . Hypokalemia 04/06/2015  . Hyperlipidemia with target LDL less than 100 04/06/2015  . Syncope 03/11/2015  . CVA (cerebral vascular accident) ([place]) 02/15/2015  . Essential hypertension 01/12/2015  . Chronically on opiate therapy 01/12/2015  . Cirrhosis of liver not due to alcohol ([place]) 2016  . S/P Nissen fundoplication (without gastrostomy tube) procedure 05/11/2014  . Dysphagia 04/19/2014  . DEPRESSION/ANXIETY 06/27/2007  . Myofascial pain syndrome 06/27/2007  . Chronic pain syndrome 03/28/2007  . GERD 03/27/2007  . Diverticulosis of large intestine without perforation or abscess without bleeding 03/27/2007  . Proteinuria 03/27/2007    Past Surgical History:  Procedure Laterality Date  . ABDOMINAL HYSTERECTOMY    . CARDIAC CATHETERIZATION N/A 01/12/2016   Procedure: Left Heart Cath and Coronary Angiography;  Surgeon: Wellington Hampshire, MD;  Location: [facility] [place] CV LAB;  Service: Cardiovascular;  Laterality: N/A;  . CHOLECYSTECTOMY    . CYSTOSCOPY/URETEROSCOPY/HOLMIUM LASER Right 07/14/2016   Procedure: CYSTOSCOPY/URETEROSCOPY/HOLMIUM LASER;  Surgeon: Alexis Frock, MD;  Location: [facility] ORS;  Service: Urology;  Laterality: Right;  . ESOPHAGOGASTRODUODENOSCOPY N/A 04/04/2015   Procedure: ESOPHAGOGASTRODUODENOSCOPY (EGD);  Surgeon: Hulen Luster, MD;  Location: [facility] ENDOSCOPY;  Service: Endoscopy;  Laterality: N/A;  . ESOPHAGOGASTRODUODENOSCOPY N/A 12/28/2017   Procedure: ESOPHAGOGASTRODUODENOSCOPY  (EGD);  Surgeon: Lin Landsman, MD;  Location[facility] ENDOSCOPY;  Service: Gastroenterology;  Laterality: N/A;  . ESOPHAGOGASTRODUODENOSCOPY (EGD) WITH PROPOFOL N/A 01/18/2016   Procedure: ESOPHAGOGASTRODUODENOSCOPY (EGD) WITH PROPOFOL;  Surgeon: Lucilla Lame, MD;  Location: [facility] ENDOSCOPY;  Service: Endoscopy;  Laterality: N/A;  . FLEXIBLE SIGMOIDOSCOPY N/A 01/18/2016   Procedure: FLEXIBLE SIGMOIDOSCOPY;  Surgeon: Lucilla Lame, MD;  Location: [facility] ENDOSCOPY;  Service: Endoscopy;  Laterality: N/A;  . HERNIA REPAIR      Prior to Admission medications   Medication Sig Start Date End Date Taking? Authorizing Provider  albuterol (PROVENTIL HFA;VENTOLIN HFA) 108 (90 Base) MCG/ACT inhaler Inhale 2 puffs into the lungs every 6 (six) hours as needed for wheezing or shortness of breath. 11/27/18   Epifanio Lesches, MD  atorvastatin (LIPITOR) 40 MG tablet Take 40 mg by mouth at bedtime.     [provider]  cholecalciferol (VITAMIN D3) 25 MCG (1000 UNIT) tablet Take 1,000 Units by mouth daily.    [provider]  cyclobenzaprine (FLEXERIL) 10 MG tablet Take 1 tablet (10 mg total) by mouth 3 (three) times daily as needed for muscle spasms. Must last 30 days. 10/11/19 04/08/20  Milinda Pointer, MD  glucose blood (ACCU-CHEK AVIVA PLUS) test strip Use as instructed to test blood sugar up to 4 times daily 10/09/19   [name], NP  insulin detemir (LEVEMIR) 100 UNIT/ML FlexPen Inject 30-34 Units into the skin See admin instructions. Inject 34u under the skin every morning and inject 30u under the skin every night 11/27/19   Enzo Bi, MD  magnesium gluconate (MAGONATE) 500 MG tablet Take 500 mg by mouth 2 (two) times daily.    [provider]  nitroGLYCERIN (NITROSTAT) 0.3 MG SL tablet Place 1 tablet (0.3 mg total) under the tongue every 5 (five) minutes as needed for chest pain. 07/24/19 07/23/20  Nena Polio, MD  ondansetron (ZOFRAN ODT) 4 MG disintegrating tablet Take 1  tablet (4 mg total) by mouth every 8 (eight) hours as needed for nausea or vomiting. 08/26/19   Carrie Mew, MD  oxyCODONE (OXY IR/ROXICODONE) 5 MG immediate release tablet Take 1 tablet (5 mg total) by mouth every 4 (four) hours as needed for severe pain. Must last 30 days. Max: 6/day 11/10/19 12/10/19  Milinda Pointer, MD  oxyCODONE (OXY IR/ROXICODONE) 5 MG immediate release tablet Take 1 tablet (5 mg total) by mouth every 4 (four) hours as needed for severe pain. Must last 30 days. Max: 6/day 12/10/19 01/09/20  Milinda Pointer, MD  sertraline (ZOLOFT) 50 MG tablet Take 1 tablet (50 mg total) by mouth daily. For anxiety. Patient taking differently: Take 50 mg by mouth at bedtime. For anxiety. 10/20/19   [name], NP    Allergies Aspirin, Codeine sulfate, Erythromycin, Prednisone, Rosiglitazone maleate, and Tetanus-diphtheria toxoids td  Family History  Problem Relation Age of Onset  . Hypertension Mother   . CAD Sister   . Heart attack Sister        Deceased 11/19/2014  . CAD Brother     Social History Social History  Tobacco Use  . Smoking status: Former Smoker    Types: Cigarettes  . Smokeless tobacco: Never Used  . Tobacco comment: 25 years ago and only smoked occasionally  Substance Use Topics  . Alcohol use: No  . Drug use: No    Review of Systems Constitutional: No fever/chills Eyes: No visual changes. ENT: No sore throat. Cardiovascular: Denies chest pain. Respiratory: Denies shortness of breath. Gastrointestinal: Severe pelvic pain.  No nausea, no vomiting.  No diarrhea.  No constipation. Genitourinary: Urethral versus vaginal bleeding with severe pain. Musculoskeletal: Negative for neck pain.  Negative for back pain. Integumentary: Negative for rash. Neurological: Negative for headaches, focal weakness or numbness.   ____________________________________________   PHYSICAL EXAM:  VITAL SIGNS: ED Triage Vitals  Enc Vitals Group     BP 12/05/19  0215 (!) 143/88     Pulse Rate 12/05/19 0213 (!) 114     Resp 12/05/19 0213 15     Temp --      Temp src --      SpO2 12/05/19 0213 96 %     Weight 12/05/19 0215 76.2 kg (168 lb)     Height 12/05/19 0215 1.6 m (5' 3" )     Head Circumference --      Peak Flow --      Pain Score 12/05/19 0213 9     Pain Loc --      Pain Edu? --      Excl. in [place]? --     Constitutional: Alert and oriented.  No significant pain at rest but severe pain when she moves around. Eyes: Conjunctivae are normal.  Head: Atraumatic. Nose: No congestion/rhinnorhea. Mouth/Throat: Patient is wearing a mask. Neck: No stridor.  No meningeal signs.   Cardiovascular: Normal rate, regular rhythm. Good peripheral circulation. Grossly normal heart sounds. Respiratory: Normal respiratory effort.  No retractions. Gastrointestinal: Soft and nondistended with tenderness palpation suprapubic region. Genitourinary:  Musculoskeletal: No lower extremity tenderness nor edema. No gross deformities of extremities. Neurologic:  Normal speech and language. No gross focal neurologic deficits are appreciated.  Skin:  Skin is warm, dry and intact. Psychiatric: Mood and affect are normal. Speech and behavior are normal.  ____________________________________________   LABS (all labs ordered are listed, but only abnormal results are displayed)  Labs Reviewed  BASIC METABOLIC PANEL - Abnormal; Notable for the following components:      Result Value   Sodium 131 (*)    CO2 21 (*)    Glucose, Bld 489 (*)    Creatinine, Ser 1.43 (*)    GFR calc non Af Amer 38 (*)    GFR calc Af Amer 44 (*)    All other components within normal limits  CBC WITH DIFFERENTIAL/PLATELET - Abnormal; Notable for the following components:   RBC 3.79 (*)    Hemoglobin 9.6 (*)    HCT 31.9 (*)    MCH 25.3 (*)    RDW 16.1 (*)    Platelets 145 (*)    All other components within normal limits  LIPASE, BLOOD  HEPATIC FUNCTION PANEL  TYPE AND SCREEN    ____________________________________________  EKG  No indication for emergent EKG ____________________________________________  RADIOLOGY Ursula Alert, personally viewed and evaluated these images (plain radiographs) as part of my medical decision making, as well as reviewing the written report by the radiologist.  ED MD interpretation: No acute changes other than some peripancreatic inflammatory changes not previously seen.  However this does not correlate clinically.  The  urinary system changes seen are consistent with prior imaging and she is being worked up by urology for the high probability of cancer.  Official radiology report(s): CT ABDOMEN PELVIS W CONTRAST  Result Date: 12/05/2019 CLINICAL DATA:  Vulvar pain, possible bladder cancer. EXAM: CT ABDOMEN AND PELVIS WITH CONTRAST TECHNIQUE: Multidetector CT imaging of the abdomen and pelvis was performed using the standard protocol following bolus administration of intravenous contrast. CONTRAST:  76m OMNIPAQUE IOHEXOL 300 MG/ML  SOLN COMPARISON:  11/23/2019 FINDINGS: Lower chest: No acute abnormality. Hepatobiliary: Nodular contour of the liver. No focal hepatic mass. Prior cholecystectomy. No intrahepatic or extrahepatic biliary ductal dilatation. Pancreas: Peripancreatic inflammatory changes concerning for mild acute pancreatitis. Pancreas enhances normally without areas of necrosis. Spleen: Mild splenomegaly. Adrenals/Urinary Tract: Normal adrenal glands. Atrophic right kidney. Moderate right hydroureteronephrosis to the level of the mid right ureter similar in appearance compared with the prior exam of 08/18/2018. Irregular bladder wall thickening concerning for bladder wall malignancy. Correlate with recent cystoscopy. Stomach/Bowel: Stomach is within normal limits. Moderate amount of stool throughout the colon. Scratch them moderate amount of stool in the ascending and transverse colon. Air-fluid level of the cecum with mild  pericolonic inflammatory changes. Appendix appears normal. No evidence of bowel wall thickening, distention, or inflammatory changes. Vascular/Lymphatic: Aortic atherosclerosis. No enlarged abdominal or pelvic lymph nodes. Reproductive: Status post hysterectomy. No adnexal masses. Other: No abdominal wall hernia or abnormality. No abdominopelvic ascites. Musculoskeletal: No acute or significant osseous findings. Degenerative disease with disc height loss at L5-S1 with bilateral facet arthropathy and bilateral foraminal narrowing. IMPRESSION: 1. Peripancreatic inflammatory changes concerning for mild acute pancreatitis. 2. Air-fluid level of the cecum with mild pericolonic inflammatory changes which may reflect mild infectious colitis. 3. Irregular bladder wall thickening concerning for bladder wall malignancy. Correlate with recent cystoscopy. 4. Moderate right hydroureteronephrosis to the level of the mid right ureter similar in appearance compared with the prior exam of 08/18/2018 likely reflecting chronic changes. 5. Cirrhotic liver with mild splenomegaly. 6. Aortic Atherosclerosis (ICD10-I70.0). Electronically Signed   By: HKathreen Devoid  On: 12/05/2019 06:35    ____________________________________________   PROCEDURES   Procedure(s) performed (including Critical Care):  Procedures   ____________________________________________   INITIAL IMPRESSION / [place]DM / [place]/ ED COURSE  As part of my medical decision making, I reviewed the following data within the e[place]notes reviewed and incorporated, Labs reviewed , Old chart reviewed, Notes from prior ED visits and [place] Controlled Substance Database   Differential diagnosis includes, but is not limited to, urethral trauma from recent cystoscopy, hemoperitoneum or pneumoperitoneum, worsening neoplasm, acute on chronic pain syndrome, less likely UTI.  SBO/ileus is also possible.  The patient has no tenderness  to palpation of the abdomen except for at the very bottomof the suprapubic region.  She is mostly reporting severe pain at the urethra, not in the abdomen or pelvis.  I performed a genital exam with an ED chaperone present and there was no sign of trauma, no sign of infection, and the patient tolerated the exam well.  Even when I was specifically examining the urethra and performing the speculum exam, the patient did not react (although she said it was uncomfortable ) which is reassuring.  Given the severity of the pain she was reporting in the recent instrumentation, I will evaluate with a repeat CT scan to compare against the recent 1 to see if there are any concerning changes that indicate an acute or  emergent abnormality.  Her lab work is generally reassuring with no leukocytosis, stable hemoglobin, basic metabolic panel that demonstrates hyperglycemia but a creatinine that is within range of her normal.  She is mildly tachycardic upon arrival but in pain.  I am giving 4 mg of IV morphine and will reassess.      Clinical Course as of Dec 04 740  Sat Dec 05, 2019  9622 CT scan demonstrates the chronic bladder and ureteral changes previously seen here however it also questions the possibility of acute pancreatitis as well as a mild and likely inconsequential infectious colitis (she is not reporting any diarrhea).  I have added on hepatic function panel and lipase to the existing blood.  CT ABDOMEN PELVIS W CONTRAST [CF]  0730 The patient hepatic function panel and lipase are normal.  I went back and reassessed the patient.  She is resting comfortably.  Her heart rate is just under 100 at this time.  She says that she feels better and she is reassured to know that there is nothing acute on her CT scan.  I reassessed her abdomen and she has no tenderness to palpation of the upper abdomen at all and I think that the peripancreatic changes seen on the CT are more likely to represent an ongoing malignancy  rather than an acute pancreatitis.  I recommended that she follow-up with her urologist as planned since he has a clear plan in place for further evaluation and she agrees.  I gave my usual and customary return precautions.   [CF]  [phone] Of note, I think there is very little benefit to repeating a urinalysis tonight.  I reviewed the medical record including that of the urologist which notes that she has been diagnosed multiple times with urinary tract infections but at no point in the last several years has she ever had a positive culture.  Given her recent cystoscopy as well as her ongoing chronic issues, I think it is likely that I would do more harm than good by giving unnecessary antibiotics.  She is comfortable with the plan for follow-up with Dr. Diamantina Providence.   [CF]    Clinical Course User Index [CF] Hinda Kehr, MD     ____________________________________________  FINAL CLINICAL IMPRESSION(S) / ED DIAGNOSES  Final diagnoses:  Urethral pain     MEDICATIONS GIVEN DURING THIS VISIT:  Medications  morphine 4 MG/ML injection 4 mg (4 mg Intravenous Given 12/05/19 0421)  sodium chloride 0.9 % bolus 500 mL (500 mLs Intravenous New Bag/Given 12/05/19 0722)  iohexol (OMNIPAQUE) 300 MG/ML solution 75 mL (75 mLs Intravenous Contrast Given 12/05/19 0536)     ED Discharge Orders    None      *Please note:  [name] was evaluated in Emergency Department on 12/05/2019 for the symptoms described in the history of present illness. She was evaluated in the context of the global COVID-19 pandemic, which necessitated consideration that the patient might be at risk for infection with the SARS-CoV-2 virus that causes COVID-19. Institutional protocols and algorithms that pertain to the evaluation of patients at risk for COVID-19 are in a state of rapid change based on information released by regulatory bodies including the CDC and federal and state organizations. These policies and algorithms were  followed during the patient's care in the ED.  Some ED evaluations and interventions may be delayed as a result of limited staffing during the pandemic.*  Note:  This document was prepared using Systems analyst and  may include unintentional dictation errors.   Hinda Kehr, MD 12/05/19 [phone]

## 2019-12-05 NOTE — ED Triage Notes (Signed)
Pt arrives to ED from home via [facility] with c/c of vaginal bleeding beginning this afternoon. Pt had scope for renal cancer approx 3 days ago. EMS reports transport vitals of 157/90, p120 ST, O2 98% on room air, CBG 579. Upon arrival, pt A&Ox3, NAD. Pt able to ambulate from EMS stretcher to hospital stretcher.

## 2019-12-08 ENCOUNTER — Ambulatory Visit: Payer: Medicaid Other | Admitting: Primary Care

## 2019-12-08 ENCOUNTER — Other Ambulatory Visit: Payer: Self-pay | Admitting: Urology

## 2019-12-08 LAB — CULTURE, URINE COMPREHENSIVE

## 2019-12-09 ENCOUNTER — Other Ambulatory Visit
Admission: RE | Admit: 2019-12-09 | Discharge: 2019-12-09 | Disposition: A | Discharge status: Home / Self Care | Payer: Medicare Other | Source: Ambulatory Visit | Attending: Urology | Admitting: Urology

## 2019-12-09 ENCOUNTER — Telehealth: Payer: Self-pay

## 2019-12-09 ENCOUNTER — Other Ambulatory Visit: Payer: Self-pay

## 2019-12-09 DIAGNOSIS — Z20822 Contact with and (suspected) exposure to covid-19: Secondary | ICD-10-CM | POA: Insufficient documentation

## 2019-12-09 DIAGNOSIS — Z01812 Encounter for preprocedural laboratory examination: Secondary | ICD-10-CM | POA: Diagnosis present

## 2019-12-09 LAB — SARS CORONAVIRUS 2 (TAT 6-24 HRS): SARS Coronavirus 2: NEGATIVE

## 2019-12-09 LAB — PROTIME-INR
INR: 1.8 — ABNORMAL HIGH (ref 0.8–1.2)
Prothrombin Time: 20.7 seconds — ABNORMAL HIGH (ref 11.4–15.2)

## 2019-12-09 MED ORDER — CIPROFLOXACIN HCL 500 MG PO TABS: 500.0000 mg | ORAL_TABLET | Freq: Two times a day (BID) | ORAL | 0 refills | Status: DC

## 2019-12-09 NOTE — Telephone Encounter (Signed)
-----   Message from Billey Co, MD sent at 12/09/2019  8:21 AM EDT ----- Please start cipro 555m BID x 7 days ASAP before surgery this Friday. Needs to start abx this morning, thanks. Hopefully will help with some of her burning.  BNickolas Madrid MD 12/09/2019

## 2019-12-09 NOTE — Telephone Encounter (Signed)
Patient notified, script sent to pharmacy. She states she has been having Left flank/back pain and three days ago it started on the right as well. She denies fever chills nausea or vomiting

## 2019-12-11 ENCOUNTER — Ambulatory Visit: Payer: Medicare HMO

## 2019-12-11 ENCOUNTER — Ambulatory Visit: Payer: Medicare HMO | Admitting: Certified Registered"

## 2019-12-11 ENCOUNTER — Other Ambulatory Visit: Payer: Self-pay

## 2019-12-11 ENCOUNTER — Encounter: Payer: Self-pay | Admitting: Urology

## 2019-12-11 ENCOUNTER — Ambulatory Visit
Admission: RE | Admit: 2019-12-11 | Discharge: 2019-12-11 | Disposition: A | Discharge status: Home / Self Care | Payer: Medicare HMO | Attending: Urology | Admitting: Urology

## 2019-12-11 ENCOUNTER — Encounter
Admission: RE | Disposition: A | Discharge status: Home / Self Care | Payer: Self-pay | Source: Home / Self Care | Attending: Urology

## 2019-12-11 DIAGNOSIS — E039 Hypothyroidism, unspecified: Secondary | ICD-10-CM | POA: Diagnosis not present

## 2019-12-11 DIAGNOSIS — I10 Essential (primary) hypertension: Secondary | ICD-10-CM | POA: Diagnosis not present

## 2019-12-11 DIAGNOSIS — E1149 Type 2 diabetes mellitus with other diabetic neurological complication: Secondary | ICD-10-CM | POA: Diagnosis not present

## 2019-12-11 DIAGNOSIS — N329 Bladder disorder, unspecified: Secondary | ICD-10-CM | POA: Diagnosis present

## 2019-12-11 DIAGNOSIS — E109 Type 1 diabetes mellitus without complications: Secondary | ICD-10-CM | POA: Diagnosis not present

## 2019-12-11 DIAGNOSIS — Z8542 Personal history of malignant neoplasm of other parts of uterus: Secondary | ICD-10-CM | POA: Insufficient documentation

## 2019-12-11 DIAGNOSIS — N3020 Other chronic cystitis without hematuria: Secondary | ICD-10-CM | POA: Insufficient documentation

## 2019-12-11 DIAGNOSIS — I2510 Atherosclerotic heart disease of native coronary artery without angina pectoris: Secondary | ICD-10-CM | POA: Insufficient documentation

## 2019-12-11 DIAGNOSIS — Z87891 Personal history of nicotine dependence: Secondary | ICD-10-CM | POA: Diagnosis not present

## 2019-12-11 DIAGNOSIS — I480 Paroxysmal atrial fibrillation: Secondary | ICD-10-CM | POA: Diagnosis not present

## 2019-12-11 DIAGNOSIS — N131 Hydronephrosis with ureteral stricture, not elsewhere classified: Secondary | ICD-10-CM | POA: Diagnosis not present

## 2019-12-11 DIAGNOSIS — E032 Hypothyroidism due to medicaments and other exogenous substances: Secondary | ICD-10-CM | POA: Insufficient documentation

## 2019-12-11 DIAGNOSIS — I252 Old myocardial infarction: Secondary | ICD-10-CM | POA: Insufficient documentation

## 2019-12-11 DIAGNOSIS — N1330 Unspecified hydronephrosis: Secondary | ICD-10-CM | POA: Insufficient documentation

## 2019-12-11 DIAGNOSIS — K219 Gastro-esophageal reflux disease without esophagitis: Secondary | ICD-10-CM | POA: Diagnosis not present

## 2019-12-11 DIAGNOSIS — N3289 Other specified disorders of bladder: Secondary | ICD-10-CM | POA: Diagnosis not present

## 2019-12-11 DIAGNOSIS — E782 Mixed hyperlipidemia: Secondary | ICD-10-CM | POA: Diagnosis not present

## 2019-12-11 DIAGNOSIS — N3090 Cystitis, unspecified without hematuria: Secondary | ICD-10-CM | POA: Diagnosis not present

## 2019-12-11 HISTORY — PX: URETEROSCOPY: SHX842

## 2019-12-11 HISTORY — PX: CYSTOSCOPY W/ RETROGRADES: SHX1426

## 2019-12-11 HISTORY — PX: CYSTOSCOPY WITH FULGERATION: SHX6638

## 2019-12-11 HISTORY — PX: CYSTOSCOPY WITH BIOPSY: SHX5122

## 2019-12-11 HISTORY — PX: CYSTOSCOPY WITH URETHRAL DILATATION: SHX5125

## 2019-12-11 HISTORY — PX: CYSTOSCOPY WITH STENT PLACEMENT: SHX5790

## 2019-12-11 LAB — GLUCOSE, CAPILLARY
Glucose-Capillary: 131 mg/dL — ABNORMAL HIGH (ref 70–99)
Glucose-Capillary: 135 mg/dL — ABNORMAL HIGH (ref 70–99)

## 2019-12-11 LAB — PROTIME-INR
INR: 1.3 — ABNORMAL HIGH (ref 0.8–1.2)
Prothrombin Time: 16.3 seconds — ABNORMAL HIGH (ref 11.4–15.2)

## 2019-12-11 SURGERY — CYSTOSCOPY, WITH BIOPSY
Anesthesia: General | Site: Ureter | Laterality: Right

## 2019-12-11 MED ORDER — SODIUM CHLORIDE 0.9 % IV SOLN: INTRAVENOUS | Status: DC

## 2019-12-11 MED ORDER — MIDAZOLAM HCL 2 MG/2ML IJ SOLN
INTRAMUSCULAR | Status: DC | PRN
  Administered 2019-12-11: 2 mg via INTRAVENOUS

## 2019-12-11 MED ORDER — IPRATROPIUM-ALBUTEROL 0.5-2.5 (3) MG/3ML IN SOLN
RESPIRATORY_TRACT | Status: AC
  Administered 2019-12-11: 15:00:00 3 mL via RESPIRATORY_TRACT
  Filled 2019-12-11: qty 3

## 2019-12-11 MED ORDER — DEXAMETHASONE SODIUM PHOSPHATE 10 MG/ML IJ SOLN
INTRAMUSCULAR | Status: DC | PRN
  Administered 2019-12-11: 10 mg via INTRAVENOUS

## 2019-12-11 MED ORDER — ONDANSETRON HCL 4 MG/2ML IJ SOLN
INTRAMUSCULAR | Status: DC | PRN
  Administered 2019-12-11: 4 mg via INTRAVENOUS

## 2019-12-11 MED ORDER — EPHEDRINE SULFATE 50 MG/ML IJ SOLN
INTRAMUSCULAR | Status: DC | PRN
  Administered 2019-12-11: 10 mg via INTRAVENOUS

## 2019-12-11 MED ORDER — CEFAZOLIN SODIUM-DEXTROSE 2-4 GM/100ML-% IV SOLN
INTRAVENOUS | Status: AC
  Filled 2019-12-11: qty 100

## 2019-12-11 MED ORDER — LACTATED RINGERS IV SOLN: INTRAVENOUS | Status: DC | PRN

## 2019-12-11 MED ORDER — LACTATED RINGERS IV SOLN: INTRAVENOUS | Status: DC

## 2019-12-11 MED ORDER — IPRATROPIUM-ALBUTEROL 0.5-2.5 (3) MG/3ML IN SOLN: 3.0000 mL | Freq: Once | RESPIRATORY_TRACT | Status: AC

## 2019-12-11 MED ORDER — FENTANYL CITRATE (PF) 100 MCG/2ML IJ SOLN
25.0000 ug | INTRAMUSCULAR | Status: DC | PRN
  Administered 2019-12-11: 16:00:00 25 ug via INTRAVENOUS

## 2019-12-11 MED ORDER — LORAZEPAM 2 MG/ML IJ SOLN: 1.0000 mg | Freq: Once | INTRAMUSCULAR | Status: AC

## 2019-12-11 MED ORDER — PROPOFOL 10 MG/ML IV BOLUS
INTRAVENOUS | Status: DC | PRN
  Administered 2019-12-11: 90 mg via INTRAVENOUS

## 2019-12-11 MED ORDER — CEFAZOLIN SODIUM-DEXTROSE 2-4 GM/100ML-% IV SOLN
2.0000 g | INTRAVENOUS | Status: AC
  Administered 2019-12-11: 13:00:00 2 g via INTRAVENOUS

## 2019-12-11 MED ORDER — SUGAMMADEX SODIUM 200 MG/2ML IV SOLN
INTRAVENOUS | Status: DC | PRN
  Administered 2019-12-11: 152.4 mg via INTRAVENOUS

## 2019-12-11 MED ORDER — FENTANYL CITRATE (PF) 100 MCG/2ML IJ SOLN
INTRAMUSCULAR | Status: DC | PRN
  Administered 2019-12-11: 50 ug via INTRAVENOUS

## 2019-12-11 MED ORDER — BELLADONNA ALKALOIDS-OPIUM 16.2-60 MG RE SUPP
RECTAL | Status: DC | PRN
  Administered 2019-12-11: 1 via RECTAL

## 2019-12-11 MED ORDER — SODIUM CHLORIDE 0.9 % IV SOLN
2.0000 g | Freq: Once | INTRAVENOUS | Status: AC
  Administered 2019-12-11: 13:00:00 2 g via INTRAVENOUS
  Filled 2019-12-11: qty 2

## 2019-12-11 MED ORDER — BELLADONNA ALKALOIDS-OPIUM 16.2-60 MG RE SUPP
RECTAL | Status: AC
  Filled 2019-12-11: qty 1

## 2019-12-11 MED ORDER — PHENYLEPHRINE HCL (PRESSORS) 10 MG/ML IV SOLN
INTRAVENOUS | Status: DC | PRN
  Administered 2019-12-11 (×2): 100 ug via INTRAVENOUS

## 2019-12-11 MED ORDER — FAMOTIDINE 20 MG PO TABS
ORAL_TABLET | ORAL | Status: AC
  Filled 2019-12-11: qty 1

## 2019-12-11 MED ORDER — MIDAZOLAM HCL 2 MG/2ML IJ SOLN
INTRAMUSCULAR | Status: AC
  Filled 2019-12-11: qty 2

## 2019-12-11 MED ORDER — SUCCINYLCHOLINE CHLORIDE 20 MG/ML IJ SOLN
INTRAMUSCULAR | Status: DC | PRN
  Administered 2019-12-11: 100 mg via INTRAVENOUS

## 2019-12-11 MED ORDER — ROCURONIUM BROMIDE 100 MG/10ML IV SOLN
INTRAVENOUS | Status: DC | PRN
  Administered 2019-12-11: 40 mg via INTRAVENOUS
  Administered 2019-12-11: 10 mg via INTRAVENOUS

## 2019-12-11 MED ORDER — IOHEXOL 180 MG/ML  SOLN
INTRAMUSCULAR | Status: DC | PRN
  Administered 2019-12-11: 40 mL

## 2019-12-11 MED ORDER — LORAZEPAM 2 MG/ML IJ SOLN
INTRAMUSCULAR | Status: AC
  Administered 2019-12-11: 15:00:00 1 mg via INTRAVENOUS
  Filled 2019-12-11: qty 1

## 2019-12-11 MED ORDER — LIDOCAINE HCL (CARDIAC) PF 100 MG/5ML IV SOSY
PREFILLED_SYRINGE | INTRAVENOUS | Status: DC | PRN
  Administered 2019-12-11: 80 mg via INTRAVENOUS

## 2019-12-11 MED ORDER — FAMOTIDINE 20 MG PO TABS
20.0000 mg | ORAL_TABLET | Freq: Once | ORAL | Status: AC
  Administered 2019-12-11: 12:00:00 20 mg via ORAL

## 2019-12-11 MED ORDER — FENTANYL CITRATE (PF) 100 MCG/2ML IJ SOLN
INTRAMUSCULAR | Status: AC
  Administered 2019-12-11: 16:00:00 25 ug via INTRAVENOUS
  Filled 2019-12-11: qty 2

## 2019-12-11 MED ORDER — FENTANYL CITRATE (PF) 100 MCG/2ML IJ SOLN
INTRAMUSCULAR | Status: AC
  Filled 2019-12-11: qty 2

## 2019-12-11 SURGICAL SUPPLY — 40 items
BAG DRAIN CYSTO-URO LG1000N (MISCELLANEOUS) ×6 IMPLANT
BRUSH SCRUB EZ  4% CHG (MISCELLANEOUS) ×6
BRUSH SCRUB EZ 1% IODOPHOR (MISCELLANEOUS) ×6 IMPLANT
BRUSH SCRUB EZ 4% CHG (MISCELLANEOUS) ×4 IMPLANT
BULB IRRIG PATHFIND (MISCELLANEOUS) IMPLANT
CATH URETL 5X70 OPEN END (CATHETERS) ×6 IMPLANT
CNTNR SPEC 2.5X3XGRAD LEK (MISCELLANEOUS) ×4
CONRAY 43 FOR UROLOGY 50M (MISCELLANEOUS) ×6 IMPLANT
CONT SPEC 4OZ STER OR WHT (MISCELLANEOUS) ×2
CONT SPEC 4OZ STRL OR WHT (MISCELLANEOUS) ×4
CONTAINER SPEC 2.5X3XGRAD LEK (MISCELLANEOUS) ×4 IMPLANT
DILATOR UROMAX ULTRA (MISCELLANEOUS) ×2 IMPLANT
DRAPE UTILITY 15X26 TOWEL STRL (DRAPES) ×6 IMPLANT
DRSG TELFA 4X3 1S NADH ST (GAUZE/BANDAGES/DRESSINGS) ×6 IMPLANT
ELECT REM PT RETURN 9FT ADLT (ELECTROSURGICAL) ×6
ELECTRODE REM PT RTRN 9FT ADLT (ELECTROSURGICAL) ×4 IMPLANT
GLOVE BIOGEL PI IND STRL 7.5 (GLOVE) ×4 IMPLANT
GLOVE BIOGEL PI INDICATOR 7.5 (GLOVE) ×2
GOWN STRL REUS W/ TWL LRG LVL3 (GOWN DISPOSABLE) ×4 IMPLANT
GOWN STRL REUS W/ TWL XL LVL3 (GOWN DISPOSABLE) ×4 IMPLANT
GOWN STRL REUS W/TWL LRG LVL3 (GOWN DISPOSABLE) ×6
GOWN STRL REUS W/TWL XL LVL3 (GOWN DISPOSABLE) ×6
GUIDEWIRE GREEN .038 145CM (MISCELLANEOUS) ×6 IMPLANT
GUIDEWIRE STR DUAL SENSOR (WIRE) ×6 IMPLANT
INFUSOR MANOMETER BAG 3000ML (MISCELLANEOUS) ×6 IMPLANT
INTRODUCER DILATOR DOUBLE (INTRODUCER) IMPLANT
KIT TURNOVER CYSTO (KITS) ×6 IMPLANT
PACK CYSTO AR (MISCELLANEOUS) ×6 IMPLANT
SET CYSTO W/LG BORE CLAMP LF (SET/KITS/TRAYS/PACK) ×6 IMPLANT
SHEATH URETERAL 12FRX35CM (MISCELLANEOUS) IMPLANT
SOL .9 NS 3000ML IRR  AL (IV SOLUTION) ×6
SOL .9 NS 3000ML IRR AL (IV SOLUTION) ×4
SOL .9 NS 3000ML IRR UROMATIC (IV SOLUTION) ×4 IMPLANT
STENT URET 6FRX24 CONTOUR (STENTS) ×3 IMPLANT
SURGILUBE 2OZ TUBE FLIPTOP (MISCELLANEOUS) ×6 IMPLANT
TUBING ART PRESS 48 MALE/FEM (TUBING) IMPLANT
VALVE UROSEAL ADJ ENDO (VALVE) IMPLANT
WATER STERILE IRR 1000ML POUR (IV SOLUTION) ×6 IMPLANT
WATER STERILE IRR 3000ML UROMA (IV SOLUTION) ×6 IMPLANT
boston sci uromax ultra kit IMPLANT

## 2019-12-11 NOTE — Op Note (Signed)
Date of procedure: 12/11/19  Preoperative diagnosis:  1. Right hydroureteronephrosis 2. Bladder lesion  Postoperative diagnosis:  1. Same  Procedure: 1. Cystoscopy, bladder biopsy and fulguration 2. Bilateral retrograde pyelograms with intraoperative interpretation 3. Right diagnostic ureteroscopy 4. Balloon dilation of right distal ureter 5. Right ureteral stent placement on Dangler  Surgeon: Nickolas Madrid, MD  Anesthesia: General  Complications: None  Intraoperative findings:  1.  Mild erythema of left lateral bladder wall, biopsied and fulgurated 2.  Left retrograde pyelogram with no abnormalities 3.  Right retrograde pyelogram mildly narrow at the UVJ, mild hydroureteronephrosis, otherwise normal without filling defects 4.  Uncomplicated right distal ureteral balloon dilation and stent placement on Dangler  EBL: Minimal  Specimens: Bladder lesion  Drains: Right 6 French by 24 cm ureteral stent on Dangler  Indication: [name] is a 64 y.o. patient with complex urologic history including retained right ureteral stent after ureteroscopy for 3+ years.  She was recently found to have mild right hydroureteronephrosis down to the bladder, as well as significant bladder erythema on cystoscopy despite multiple prior negative cultures, and presents today for further evaluation.  After reviewing the management options for treatment, they elected to proceed with the above surgical procedure(s). We have discussed the potential benefits and risks of the procedure, side effects of the proposed treatment, the likelihood of the patient achieving the goals of the procedure, and any potential problems that might occur during the procedure or recuperation. Informed consent has been obtained.  Description of procedure:  The patient was taken to the operating room and general anesthesia was induced. SCDs were placed for DVT prophylaxis. The patient was placed in the dorsal lithotomy  position, prepped and draped in the usual sterile fashion, and preoperative antibiotics(Ancef and ampicillin) were administered. A preoperative time-out was performed.   A 21 French rigid cystoscope was used to intubate the urethra.  Thorough cystoscopy was performed showing a mild patch of 2 cm of erythema at the left lateral bladder wall, and a small patch at the right lateral wall.  There were no frank bladder tumors.    I started by performing a left retrograde pyelogram which showed no filling defects, hydronephrosis, or abnormalities.  A right retrograde pyelogram was performed which showed some mild narrowing at the UVJ with upstream mild hydroureteronephrosis but no other filling defects or abnormalities.  I then biopsied all the abnormal tissue in the bladder and this was thoroughly fulgurated with the Bug[place] and there was excellent hemostasis.  With the bladder decompressed, there was no bleeding noted.  A sensor wire was advanced into the right distal ureter and advanced easily up to the renal pelvis under fluoroscopy.  The semirigid ureteroscope was advanced alongside the wire and met only minimal resistance in the distal ureter.  Thorough diagnostic ureteroscopy was performed of the distal and mid ureter showing no erythema or tumors.  There was some subtle narrowing at the UVJ, but the scope advanced alongside the wire.  I elected to perform a balloon dilation of the right distal UVJ, and the 15 [place] by 6 cm UroMax dilating balloon was advanced over the wire under fluoroscopic vision and dilated to a pressure of 20 ATM under fluoroscopic vision.  A 6 French by 24 cm stent with Dangler attached was placed easily under fluoroscopy with an excellent curl in the renal pelvis, as well as in the bladder.  The Dangler was secured with a Tegaderm to the suprapubic area.  The bladder was drained and  this concluded procedure.  A belladonna suppository was placed.  Disposition: Stable to  PACU  Plan: Remove stent at home on Tuesday morning 12/15/2019 Follow-up in clinic in 2 weeks to discuss pathology and symptoms Finish course of Cipro Anticipate renal ultrasound in 4 to 6 weeks to evaluate for any change in mild right-sided hydroureteronephrosis  Nickolas Madrid, MD

## 2019-12-11 NOTE — H&P (Signed)
12/11/19 12:53 PM   [name] Dec 02, 1955 [id number]  HPI:  She is a complex and co-morbid 64 year old female with history of a right-sided ureteral stone treated by Dr. Tresa Moore in 2017 was lost to follow-up with a retained right ureteral stent for 3+ years.  She had multiple UTIs and episodes of pyelonephritis with a stent in place.  Her stent was ultimately removed in clinic on 08/24/2019.  She has had multiple hospitalizations over the last few months with vague abdominal pain and left-sided abdominal pain, with CT showing mild right-sided hydroureteronephrosis.  Urine cultures have been almost always negative.  A NM renal scan on 11/27/2019 showed poor clearance of tracer from the right kidney with hydronephrosis and diminished renal function of 6.5%.  Cysto in clinic showed significant erythema worrisome for possible CIS.  PMH: Past Medical History:  Diagnosis Date  . Acute encephalopathy 05/22/2018  . Allergy   . Anginal pain ([place])   . Anxiety   . Ascites   . C. difficile colitis 07/10/2015  . Cancer (HCC)    HX OF CANCER OF UTERUS   . Cirrhosis of liver not due to alcohol ([place]) 2016  . Degenerative disk disease   . Diverticulitis   . Gastroparesis   . GERD (gastroesophageal reflux disease)   . History of hiatal hernia   . Hypertension   . Hypothyroid   . Hypothyroidism due to amiodarone   . Ileus ([place]) 08/01/2015  . Intussusception intestine ([place]) 05/2015  . Orthostatic hypotension   . PAF (paroxysmal atrial fibrillation) ([place]) 03/2015   a. new onset 03/2015 in setting of intractable N/V; b. on Eliquis 5 mg bid; c. CHADSVASc 4 (DM, TIA x 2, female)  . Pancreatitis   . Pneumonia 11/14/2015  . Right ureteral stone 07/14/2016  . Sepsis ([place]) 12/15/2017  . Sick sinus syndrome ([place])   . Stomach ulcer   . Stroke [facility])    with minimal left sided weakness  . Syncope 01/2015  . Syncope due to orthostatic hypotension 05/18/2015  . Tachyarrhythmia 01/10/2016  . TIA (transient  ischemic attack) 02/2015  . Type 1 diabetes ([place])    on levemir  . UTERINE CANCER, HX OF 03/27/2007   Qualifier: Diagnosis of  By: Maxie Better FNP, Rosalita Levan   . UTI (urinary tract infection) 05/22/2018    Surgical History: Past Surgical History:  Procedure Laterality Date  . ABDOMINAL HYSTERECTOMY    . CARDIAC CATHETERIZATION N/A 01/12/2016   Procedure: Left Heart Cath and Coronary Angiography;  Surgeon: Wellington Hampshire, MD;  Location: [facility];  Service: Cardiovascular;  Laterality: N/A;  . CHOLECYSTECTOMY    . CYSTOSCOPY/URETEROSCOPY/HOLMIUM LASER Right 07/14/2016   Procedure: CYSTOSCOPY/URETEROSCOPY/HOLMIUM LASER;  Surgeon: Alexis Frock, MD;  Location: [facility] ORS;  Service: Urology;  Laterality: Right;  . ESOPHAGOGASTRODUODENOSCOPY N/A 04/04/2015   Procedure: ESOPHAGOGASTRODUODENOSCOPY (EGD);  Surgeon: Hulen Luster, MD;  Location: [facility] ENDOSCOPY;  Service: Endoscopy;  Laterality: N/A;  . ESOPHAGOGASTRODUODENOSCOPY N/A 12/28/2017   Procedure: ESOPHAGOGASTRODUODENOSCOPY (EGD);  Surgeon: Lin Landsman, MD;  Location[facility] END[facility]COPY;  Service: Gastroenterology;  Laterality: N/A;  . ESOPHAGOGASTRODUODENOSCOPY (EGD) WITH PROPOFOL N/A 01/18/2016   Procedure: ESOPHAGOGASTRODUODENOSCOPY (EGD) WITH PROPOFOL;  Surgeon: Lucilla Lame, MD;  Location: [facility] ENDOSCOPY;  Service: Endoscopy;  Laterality: N/A;  . FLEXIBLE SIGMOIDOSCOPY N/A 01/18/2016   Procedure: FLEXIBLE SIGMOIDOSCOPY;  Surgeon: Lucilla Lame, MD;  Location: [facility] ENDOSCOPY;  Service: Endoscopy;  Laterality: N/A;  . HERNIA REPAIR     Family History: Family History  Problem Relation Age of Onset  . Hypertension Mother   . CAD Sister   . Heart attack Sister        Deceased 2014-11-24  . CAD Brother     Social History:  reports that she has quit smoking. Her smoking use included cigarettes. She has never used smokeless tobacco. She reports that she does not drink alcohol or use drugs.  Physical Exam: BP 135/68   Pulse 76    Temp (!) 97.3 F (36.3 C) (Temporal)   Resp 18   SpO2 100%    Constitutional:  Alert and oriented, No acute distress. Cardiovascular: RRR Respiratory: CTA b/l GI: Abdomen is soft, nontender, nondistended, no abdominal masses   Laboratory Data: Multiple negative urine cultures over the last 3 months, most recent urine cx 12/02/19 with >100k enterococcus, treated with culture appropriate abx.  Assessment & Plan:   In summary she is a very comorbid and complex 64 year old female that had a retained right ureteral stent for over 3 years before having it removed in December 2020.  She has developed worsening abdominal pain, interestingly primarily on the left side, as well as bladder pain and dysuria despite multiple negative urine cultures.  CT showed thickening of the bladder wall, and clinic cystoscopy showed significant erythema worrisome for possible malignancy.  She presents today for cystoscopy, biopsy and fulguration, bilateral retrograde pyelograms, likely right diagnostic ureteroscopy to evaluate for distal ureteral stricture.  We discussed the risks and benefits at length, including possible need for additional procedures.  Nickolas Madrid, MD 12/11/2019  [facility] [address] [place], [place] [address] [phone]

## 2019-12-11 NOTE — Anesthesia Procedure Notes (Signed)
Procedure Name: Intubation Performed by: Nelda Marseille, CRNA Pre-anesthesia Checklist: Patient identified, Patient being monitored, Timeout performed, Emergency Drugs available and Suction available Patient Re-evaluated:Patient Re-evaluated prior to induction Oxygen Delivery Method: Circle system utilized Preoxygenation: Pre-oxygenation with 100% oxygen Induction Type: IV induction Ventilation: Mask ventilation without difficulty Laryngoscope Size: Mac, 3 and McGraph Grade View: Grade II Tube type: Oral Tube size: 7.0 mm Number of attempts: 1 Airway Equipment and Method: Stylet Placement Confirmation: ETT inserted through vocal cords under direct vision,  positive ETCO2 and breath sounds checked- equal and bilateral Secured at: 21 cm Tube secured with: Tape Dental Injury: Teeth and Oropharynx as per pre-operative assessment

## 2019-12-11 NOTE — Progress Notes (Signed)
Patient needed to urinate. [name], Manuela Schwartz RN, [name] RN came to help her with the bed pan then they got her to a recliner due to her back pain. Patient states it did help some with her back pain. Patient looks less anxious.

## 2019-12-11 NOTE — Progress Notes (Signed)
Patient was taken off simple face mask to room air. O2 dropped to 89-90% on RA. Placed on 2L [place]. Patient all of a sudden started gasping, saying, "I can't breathe, I can't breathe." Instructed patient to take slow deep breaths multiple times.  O2 was 97%. RR 26. Lungs clear bilaterally. Patient started cought. Dr Randa Lynn notified. Came to bedside. Ordered duoneb. Will continue to monitor.

## 2019-12-11 NOTE — Anesthesia Preprocedure Evaluation (Signed)
Anesthesia Evaluation  Patient identified by MRN, date of birth, ID band Patient awake    Reviewed: Allergy & Precautions, NPO status , Patient's Chart, lab work & pertinent test results  History of Anesthesia Complications Negative for: history of anesthetic complications  Airway Mallampati: III  TM Distance: >3 FB Neck ROM: Full    Dental  (+) Edentulous Upper, Edentulous Lower   Pulmonary neg pulmonary ROS, neg sleep apnea, neg COPD, former smoker,    breath sounds clear to auscultation- rhonchi (-) wheezing      Cardiovascular hypertension, Pt. on medications (-) angina+ CAD and + Past MI  (-) Cardiac Stents and (-) CABG  Rhythm:Regular Rate:Normal - Systolic murmurs and - Diastolic murmurs    Neuro/Psych PSYCHIATRIC DISORDERS Anxiety Depression TIACVA, No Residual Symptoms    GI/Hepatic Neg liver ROS, hiatal hernia, PUD, GERD  ,  Endo/Other  diabetes, Insulin DependentHypothyroidism   Renal/GU CRFRenal disease     Musculoskeletal  (+) Arthritis ,   Abdominal (+) - obese,   Peds  Hematology  (+) anemia ,   Anesthesia Other Findings Past Medical History: 05/22/2018: Acute encephalopathy No date: Allergy No date: Anginal pain ([place]) No date: Anxiety No date: Ascites 07/10/2015: C. difficile colitis No date: Cancer [facility])     Comment:  HX OF CANCER OF UTERUS  2016: Cirrhosis of liver not due to alcohol ([place]) No date: Degenerative disk disease No date: Diverticulitis No date: Gastroparesis No date: GERD (gastroesophageal reflux disease) No date: History of hiatal hernia No date: Hypertension No date: Hypothyroid No date: Hypothyroidism due to amiodarone 08/01/2015: Ileus ([place]) 05/2015: Intussusception intestine ([place]) No date: Orthostatic hypotension 03/2015: PAF (paroxysmal atrial fibrillation) ([place])     Comment:  a. new onset 03/2015 in setting of intractable N/V; b. on              Eliquis 5 mg bid; c.  CHADSVASc 4 (DM, TIA x 2, female) No date: Pancreatitis 11/14/2015: Pneumonia 07/14/2016: Right ureteral stone 12/15/2017: Sepsis ([place]) No date: Sick sinus syndrome ([place]) No date: Stomach ulcer No date: Stroke [facility])     Comment:  with minimal left sided weakness 01/2015: Syncope 05/18/2015: Syncope due to orthostatic hypotension 01/10/2016: Tachyarrhythmia 02/2015: TIA (transient ischemic attack) No date: Type 1 diabetes ([place])     Comment:  on levemir 03/27/2007: UTERINE CANCER, HX OF     Comment:  Qualifier: Diagnosis of  By: Maxie Better FNP, Rosalita Levan  05/22/2018: UTI (urinary tract infection)   Reproductive/Obstetrics                             Anesthesia Physical Anesthesia Plan  ASA: III  Anesthesia Plan: General   Post-op Pain Management:    Induction: Intravenous  PONV Risk Score and Plan: 2 and Ondansetron and Dexamethasone  Airway Management Planned: Oral ETT  Additional Equipment:   Intra-op Plan:   Post-operative Plan: Extubation in OR  Informed Consent: I have reviewed the patients History and Physical, chart, labs and discussed the procedure including the risks, benefits and alternatives for the proposed anesthesia with the patient or authorized representative who has indicated his/her understanding and acceptance.     Dental advisory given  Plan Discussed with: CRNA and Anesthesiologist  Anesthesia Plan Comments:         Anesthesia Quick Evaluation

## 2019-12-11 NOTE — Transfer of Care (Signed)
Immediate Anesthesia Transfer of Care Note  Patient: [name]  Procedure(s) Performed: CYSTOSCOPY WITH BLADDER BIOPSY (N/A Bladder) CYSTOSCOPY WITH FULGERATION (N/A Bladder) CYSTOSCOPY WITH RETROGRADE PYELOGRAM (Bilateral Ureter) URETEROSCOPY,DIAGNOSTIC (Right Ureter) CYSTOSCOPY WITH STENT PLACEMENT (Right Ureter) CYSTOSCOPY WITH URETERAL DILATATION (Right )  Patient Location: PACU  Anesthesia Type:General  Level of Consciousness: sedated  Airway & Oxygen Therapy: Patient Spontanous Breathing and Patient connected to face mask oxygen  Post-op Assessment: Report given to RN and Post -op Vital signs reviewed and stable  Post vital signs: Reviewed and stable  Last Vitals:  Vitals Value Taken Time  BP    Temp    Pulse 80 12/11/19 1413  Resp 24 12/11/19 1413  SpO2 99 % 12/11/19 1413  Vitals shown include unvalidated device data.  Last Pain:  Vitals:   12/11/19 1145  TempSrc: Temporal  PainSc: 0-No pain         Complications: No apparent anesthesia complications

## 2019-12-11 NOTE — Progress Notes (Signed)
Patient started again "I can't breathe" patient still has simple mask on at 6L. Reminded patient to slow her breathing. Asked patient if she has anxiety patient states she does and takes zoloft at home. Spoke with piscitello verbal order for ativan 84m IV. VSS.

## 2019-12-11 NOTE — Discharge Instructions (Signed)

## 2019-12-14 DIAGNOSIS — N3289 Other specified disorders of bladder: Secondary | ICD-10-CM | POA: Diagnosis not present

## 2019-12-14 DIAGNOSIS — R1010 Upper abdominal pain, unspecified: Secondary | ICD-10-CM | POA: Diagnosis not present

## 2019-12-14 DIAGNOSIS — R310 Gross hematuria: Secondary | ICD-10-CM | POA: Diagnosis not present

## 2019-12-14 DIAGNOSIS — Z794 Long term (current) use of insulin: Secondary | ICD-10-CM | POA: Diagnosis not present

## 2019-12-14 DIAGNOSIS — E1065 Type 1 diabetes mellitus with hyperglycemia: Secondary | ICD-10-CM | POA: Diagnosis not present

## 2019-12-14 DIAGNOSIS — N1330 Unspecified hydronephrosis: Secondary | ICD-10-CM | POA: Diagnosis not present

## 2019-12-14 DIAGNOSIS — E1043 Type 1 diabetes mellitus with diabetic autonomic (poly)neuropathy: Secondary | ICD-10-CM | POA: Diagnosis not present

## 2019-12-14 DIAGNOSIS — K6389 Other specified diseases of intestine: Secondary | ICD-10-CM | POA: Diagnosis not present

## 2019-12-14 DIAGNOSIS — R319 Hematuria, unspecified: Secondary | ICD-10-CM | POA: Diagnosis not present

## 2019-12-14 DIAGNOSIS — I4891 Unspecified atrial fibrillation: Secondary | ICD-10-CM | POA: Diagnosis not present

## 2019-12-14 DIAGNOSIS — N390 Urinary tract infection, site not specified: Secondary | ICD-10-CM | POA: Diagnosis not present

## 2019-12-14 DIAGNOSIS — K766 Portal hypertension: Secondary | ICD-10-CM | POA: Diagnosis not present

## 2019-12-14 DIAGNOSIS — Z885 Allergy status to narcotic agent status: Secondary | ICD-10-CM | POA: Diagnosis not present

## 2019-12-14 DIAGNOSIS — N136 Pyonephrosis: Secondary | ICD-10-CM | POA: Diagnosis not present

## 2019-12-14 DIAGNOSIS — K7460 Unspecified cirrhosis of liver: Secondary | ICD-10-CM | POA: Diagnosis not present

## 2019-12-14 NOTE — Anesthesia Postprocedure Evaluation (Signed)
Anesthesia Post Note  Patient: [name]  Procedure(s) Performed: CYSTOSCOPY WITH BLADDER BIOPSY (N/A Bladder) CYSTOSCOPY WITH FULGERATION (N/A Bladder) CYSTOSCOPY WITH RETROGRADE PYELOGRAM (Bilateral Ureter) URETEROSCOPY,DIAGNOSTIC (Right Ureter) CYSTOSCOPY WITH STENT PLACEMENT (Right Ureter) CYSTOSCOPY WITH URETERAL DILATATION (Right )  Patient location during evaluation: PACU Anesthesia Type: General Level of consciousness: awake and alert and oriented Pain management: pain level controlled Vital Signs Assessment: post-procedure vital signs reviewed and stable Respiratory status: spontaneous breathing, nonlabored ventilation and respiratory function stable Cardiovascular status: blood pressure returned to baseline and stable Postop Assessment: no signs of nausea or vomiting Anesthetic complications: no     Last Vitals:  Vitals:   12/11/19 1603 12/11/19 1622  BP: (!) 167/81 (!) 160/93  Pulse: 77 83  Resp: 16 16  Temp: 36.9 C (!) 36.2 C  SpO2: 97% 100%    Last Pain:  Vitals:   12/11/19 1622  TempSrc: Temporal  PainSc: 8                  Bladen Umar

## 2019-12-15 LAB — SURGICAL PATHOLOGY

## 2019-12-17 ENCOUNTER — Telehealth: Payer: Self-pay | Admitting: Primary Care

## 2019-12-17 NOTE — Telephone Encounter (Signed)
Approved.  I also need to see her for diabetes follow up, please schedule for next week if possible.

## 2019-12-17 NOTE — Telephone Encounter (Signed)
[name] called back. Gave the approval for the verbal orders and reminder for patient to make an appointment

## 2019-12-17 NOTE — Telephone Encounter (Signed)
Message left for Jeani Hawking to return my call.

## 2019-12-17 NOTE — Telephone Encounter (Signed)
[name] with [facility] called to get verbal approval  She stated per patient request they would like to start Home health services with the patient on Monday, April 12th    [facility] Call Back # [phone]

## 2019-12-21 ENCOUNTER — Telehealth: Payer: Self-pay | Admitting: Primary Care

## 2019-12-21 DIAGNOSIS — I129 Hypertensive chronic kidney disease with stage 1 through stage 4 chronic kidney disease, or unspecified chronic kidney disease: Secondary | ICD-10-CM | POA: Diagnosis not present

## 2019-12-21 DIAGNOSIS — E1122 Type 2 diabetes mellitus with diabetic chronic kidney disease: Secondary | ICD-10-CM | POA: Diagnosis not present

## 2019-12-21 DIAGNOSIS — N136 Pyonephrosis: Secondary | ICD-10-CM | POA: Diagnosis not present

## 2019-12-21 DIAGNOSIS — E1165 Type 2 diabetes mellitus with hyperglycemia: Secondary | ICD-10-CM | POA: Diagnosis not present

## 2019-12-21 DIAGNOSIS — I951 Orthostatic hypotension: Secondary | ICD-10-CM | POA: Diagnosis not present

## 2019-12-21 DIAGNOSIS — N1832 Chronic kidney disease, stage 3b: Secondary | ICD-10-CM | POA: Diagnosis not present

## 2019-12-21 DIAGNOSIS — I480 Paroxysmal atrial fibrillation: Secondary | ICD-10-CM | POA: Diagnosis not present

## 2019-12-21 DIAGNOSIS — I2510 Atherosclerotic heart disease of native coronary artery without angina pectoris: Secondary | ICD-10-CM | POA: Diagnosis not present

## 2019-12-21 DIAGNOSIS — I495 Sick sinus syndrome: Secondary | ICD-10-CM | POA: Diagnosis not present

## 2019-12-21 NOTE — Telephone Encounter (Signed)
Approved.  

## 2019-12-21 NOTE — Telephone Encounter (Signed)
[place], called.  She needs verbal approval for physical therapy for  2 x a week for 3 weeks,  1 x a week for  2 weeks and evaluation for occupational therapy and social work. Can leave a detailed message on Stacey's voice mail.

## 2019-12-22 NOTE — Telephone Encounter (Signed)
Gave the approval for the verbal orders 

## 2019-12-23 ENCOUNTER — Encounter: Payer: Self-pay | Admitting: Urology

## 2019-12-23 ENCOUNTER — Ambulatory Visit: Payer: Medicaid Other | Admitting: Urology

## 2019-12-23 ENCOUNTER — Other Ambulatory Visit: Payer: Self-pay | Admitting: Primary Care

## 2019-12-23 DIAGNOSIS — E1165 Type 2 diabetes mellitus with hyperglycemia: Secondary | ICD-10-CM | POA: Diagnosis not present

## 2019-12-23 DIAGNOSIS — E1122 Type 2 diabetes mellitus with diabetic chronic kidney disease: Secondary | ICD-10-CM | POA: Diagnosis not present

## 2019-12-23 DIAGNOSIS — I2510 Atherosclerotic heart disease of native coronary artery without angina pectoris: Secondary | ICD-10-CM | POA: Diagnosis not present

## 2019-12-23 DIAGNOSIS — F411 Generalized anxiety disorder: Secondary | ICD-10-CM

## 2019-12-23 DIAGNOSIS — I480 Paroxysmal atrial fibrillation: Secondary | ICD-10-CM | POA: Diagnosis not present

## 2019-12-23 DIAGNOSIS — I129 Hypertensive chronic kidney disease with stage 1 through stage 4 chronic kidney disease, or unspecified chronic kidney disease: Secondary | ICD-10-CM | POA: Diagnosis not present

## 2019-12-23 DIAGNOSIS — N1832 Chronic kidney disease, stage 3b: Secondary | ICD-10-CM | POA: Diagnosis not present

## 2019-12-23 DIAGNOSIS — I495 Sick sinus syndrome: Secondary | ICD-10-CM | POA: Diagnosis not present

## 2019-12-23 DIAGNOSIS — I951 Orthostatic hypotension: Secondary | ICD-10-CM | POA: Diagnosis not present

## 2019-12-23 DIAGNOSIS — N136 Pyonephrosis: Secondary | ICD-10-CM | POA: Diagnosis not present

## 2019-12-24 ENCOUNTER — Telehealth: Payer: Self-pay | Admitting: Primary Care

## 2019-12-24 ENCOUNTER — Telehealth: Payer: Self-pay

## 2019-12-24 DIAGNOSIS — N1330 Unspecified hydronephrosis: Secondary | ICD-10-CM

## 2019-12-24 NOTE — Telephone Encounter (Signed)
-----   Message from [place]ley [place], MD sent at 12/24/2019 11:39 AM EDT ----- No cancer seen on bladder biopsy. Please make sure she self removed her stent as instructed(she no showed clinic visit this week.)  Follow up should be 6 weeks with a renal US, thanks  Nickolas Madrid, MD 12/24/2019

## 2019-12-24 NOTE — Telephone Encounter (Signed)
Called pt informed her of the information below. Pt gave verbal understanding. Appt scheduled. U/S order placed.

## 2019-12-24 NOTE — Telephone Encounter (Signed)
Received a call from [place] with [facility] OT requesting verbal orders for OT  [place]  1 x 1 2 x 3

## 2019-12-25 NOTE — Telephone Encounter (Signed)
Gave the approval for the verbal orders 

## 2019-12-25 NOTE — Telephone Encounter (Signed)
[name] OT with [facility] left v/m requesting verbal orders for [name] OT 1 x a wk for 1 wk; 2 x a wk for 3 wks and 1 x a wk for 1 wk.Please advise.

## 2019-12-25 NOTE — Telephone Encounter (Signed)
Approved.  

## 2019-12-28 ENCOUNTER — Telehealth: Payer: Self-pay | Admitting: *Deleted

## 2019-12-28 DIAGNOSIS — N1832 Chronic kidney disease, stage 3b: Secondary | ICD-10-CM | POA: Diagnosis not present

## 2019-12-28 DIAGNOSIS — I2510 Atherosclerotic heart disease of native coronary artery without angina pectoris: Secondary | ICD-10-CM | POA: Diagnosis not present

## 2019-12-28 DIAGNOSIS — I129 Hypertensive chronic kidney disease with stage 1 through stage 4 chronic kidney disease, or unspecified chronic kidney disease: Secondary | ICD-10-CM | POA: Diagnosis not present

## 2019-12-28 DIAGNOSIS — I495 Sick sinus syndrome: Secondary | ICD-10-CM | POA: Diagnosis not present

## 2019-12-28 DIAGNOSIS — E1165 Type 2 diabetes mellitus with hyperglycemia: Secondary | ICD-10-CM | POA: Diagnosis not present

## 2019-12-28 DIAGNOSIS — E1122 Type 2 diabetes mellitus with diabetic chronic kidney disease: Secondary | ICD-10-CM | POA: Diagnosis not present

## 2019-12-28 DIAGNOSIS — I480 Paroxysmal atrial fibrillation: Secondary | ICD-10-CM | POA: Diagnosis not present

## 2019-12-28 DIAGNOSIS — N136 Pyonephrosis: Secondary | ICD-10-CM | POA: Diagnosis not present

## 2019-12-28 DIAGNOSIS — I951 Orthostatic hypotension: Secondary | ICD-10-CM | POA: Diagnosis not present

## 2019-12-28 NOTE — Telephone Encounter (Signed)
[name] PTA with [facility] called stating that she needs clarification on when patient is to take her Zoloft. [name] stated that patient has started getting her medications in bubble packs from the pharmacy and the Zoloft is in her am medications. [name] stated that she has in her notes patient is to take it at night and that is what the patient prefers.

## 2019-12-28 NOTE — Telephone Encounter (Signed)
Ok to take at night.

## 2019-12-29 DIAGNOSIS — I951 Orthostatic hypotension: Secondary | ICD-10-CM | POA: Diagnosis not present

## 2019-12-29 DIAGNOSIS — I480 Paroxysmal atrial fibrillation: Secondary | ICD-10-CM | POA: Diagnosis not present

## 2019-12-29 DIAGNOSIS — I2510 Atherosclerotic heart disease of native coronary artery without angina pectoris: Secondary | ICD-10-CM | POA: Diagnosis not present

## 2019-12-29 DIAGNOSIS — N136 Pyonephrosis: Secondary | ICD-10-CM | POA: Diagnosis not present

## 2019-12-29 DIAGNOSIS — I495 Sick sinus syndrome: Secondary | ICD-10-CM | POA: Diagnosis not present

## 2019-12-29 DIAGNOSIS — E1122 Type 2 diabetes mellitus with diabetic chronic kidney disease: Secondary | ICD-10-CM | POA: Diagnosis not present

## 2019-12-29 DIAGNOSIS — E1165 Type 2 diabetes mellitus with hyperglycemia: Secondary | ICD-10-CM | POA: Diagnosis not present

## 2019-12-29 DIAGNOSIS — N1832 Chronic kidney disease, stage 3b: Secondary | ICD-10-CM | POA: Diagnosis not present

## 2019-12-29 DIAGNOSIS — I129 Hypertensive chronic kidney disease with stage 1 through stage 4 chronic kidney disease, or unspecified chronic kidney disease: Secondary | ICD-10-CM | POA: Diagnosis not present

## 2019-12-29 NOTE — Telephone Encounter (Signed)
Message left for patient to return my call.  

## 2019-12-29 NOTE — Telephone Encounter (Signed)
Spoke with [facility], gave okay for med instructions, okay to take at night.   Nothing further needed.

## 2019-12-31 ENCOUNTER — Telehealth: Payer: Self-pay

## 2019-12-31 DIAGNOSIS — E1122 Type 2 diabetes mellitus with diabetic chronic kidney disease: Secondary | ICD-10-CM | POA: Diagnosis not present

## 2019-12-31 DIAGNOSIS — I129 Hypertensive chronic kidney disease with stage 1 through stage 4 chronic kidney disease, or unspecified chronic kidney disease: Secondary | ICD-10-CM | POA: Diagnosis not present

## 2019-12-31 DIAGNOSIS — N1832 Chronic kidney disease, stage 3b: Secondary | ICD-10-CM | POA: Diagnosis not present

## 2019-12-31 DIAGNOSIS — I480 Paroxysmal atrial fibrillation: Secondary | ICD-10-CM | POA: Diagnosis not present

## 2019-12-31 DIAGNOSIS — I951 Orthostatic hypotension: Secondary | ICD-10-CM | POA: Diagnosis not present

## 2019-12-31 DIAGNOSIS — I495 Sick sinus syndrome: Secondary | ICD-10-CM | POA: Diagnosis not present

## 2019-12-31 DIAGNOSIS — I2510 Atherosclerotic heart disease of native coronary artery without angina pectoris: Secondary | ICD-10-CM | POA: Diagnosis not present

## 2019-12-31 DIAGNOSIS — E1165 Type 2 diabetes mellitus with hyperglycemia: Secondary | ICD-10-CM | POA: Diagnosis not present

## 2019-12-31 DIAGNOSIS — N136 Pyonephrosis: Secondary | ICD-10-CM | POA: Diagnosis not present

## 2019-12-31 NOTE — Telephone Encounter (Signed)
Noted.  Patient already participating in home health PT, I also believe that social work has been involved.

## 2019-12-31 NOTE — Telephone Encounter (Signed)
Samantha with [facility] l/m stating patient fell again on 12/29/19. Patient is not injured. [name] was not there when this happened. Wanted to let [name] know as an Pharmacist, hospital. If we have any questions Samantha's CB is [phone]

## 2020-01-04 ENCOUNTER — Telehealth: Payer: Self-pay

## 2020-01-04 DIAGNOSIS — N1832 Chronic kidney disease, stage 3b: Secondary | ICD-10-CM | POA: Diagnosis not present

## 2020-01-04 DIAGNOSIS — I480 Paroxysmal atrial fibrillation: Secondary | ICD-10-CM | POA: Diagnosis not present

## 2020-01-04 DIAGNOSIS — I129 Hypertensive chronic kidney disease with stage 1 through stage 4 chronic kidney disease, or unspecified chronic kidney disease: Secondary | ICD-10-CM | POA: Diagnosis not present

## 2020-01-04 DIAGNOSIS — I495 Sick sinus syndrome: Secondary | ICD-10-CM | POA: Diagnosis not present

## 2020-01-04 DIAGNOSIS — I951 Orthostatic hypotension: Secondary | ICD-10-CM | POA: Diagnosis not present

## 2020-01-04 DIAGNOSIS — E1165 Type 2 diabetes mellitus with hyperglycemia: Secondary | ICD-10-CM | POA: Diagnosis not present

## 2020-01-04 DIAGNOSIS — E1122 Type 2 diabetes mellitus with diabetic chronic kidney disease: Secondary | ICD-10-CM | POA: Diagnosis not present

## 2020-01-04 DIAGNOSIS — N136 Pyonephrosis: Secondary | ICD-10-CM | POA: Diagnosis not present

## 2020-01-04 DIAGNOSIS — I2510 Atherosclerotic heart disease of native coronary artery without angina pectoris: Secondary | ICD-10-CM | POA: Diagnosis not present

## 2020-01-04 NOTE — Telephone Encounter (Signed)
[name], PT with [facility], called to report she arrived at the residence around noon and the pt c/o dry mouth, hunger, was a little pale, dizzy, HA and nauseous. She check glucose and it was 66, she gave pt a reese cup and waited 115 minutes and rechecked and glucose was 55. She then gave pt a swiss roll and waited 15 minutes and glucose was 50. She reports pt is always has HA, dizziness and nausea but the pt also reported she just got up but when she woke up earlier she was sweating. Pt has not taken any insulin or eaten anything before PT arrived. Advised to eat peanut butter crackers and soda as long as it was not diet. Samantha made crackers and advised pt has been sipping on a regular soda. Advised to stay with pt and recheck in 15 minutes and call back. Gave number to this nurse's office. Samantha verbalized understanding.    Samantha called back at 1334 to report glucose is now 108. Pt is feeling much better. Advised to educate pt on S & S of hypoglycemia and when to call EMS. [name] reports she has already educated the pt. Pt requested an apt with PCP. Scheduled apt for 5/3. Advised if anything is needed before next apt to contact office. [name] told pt and she verbalized understanding.

## 2020-01-04 NOTE — Telephone Encounter (Signed)
Noted, will evaluate as scheduled.

## 2020-01-05 ENCOUNTER — Encounter: Payer: Self-pay | Admitting: Pain Medicine

## 2020-01-05 NOTE — Progress Notes (Signed)
Patient: [name]  Service Category: E/M  Provider: Gaspar Cola, MD  DOB: 05/16/1956  DOS: 01/06/2020  Location: Office  MRN: [id number]  Setting: Ambulatory outpatient  Referring Provider: [name], NP  Type: Established Patient  Specialty: Interventional Pain Management  PCP: [name], NP  Location: Remote location  Delivery: TeleHealth     Virtual Encounter - Pain Management PROVIDER NOTE: Information contained herein reflects review and annotations entered in association with encounter. Interpretation of such information and data should be left to medically-trained personnel. Information provided to patient can be located elsewhere in the medical record under "Patient Instructions". Document created using STT-dictation technology, any transcriptional errors that may result from process are unintentional.    Contact & Pharmacy Preferred: [phone] Home: [phone] (home) Mobile: There is no such number on file (mobile). E-mail: No e-mail address on record  [place], [place] [address] [address] Phone: [phone] Fax: [phone]   Pre-screening  [name] offered "in-person" vs "virtual" encounter. She indicated preferring virtual for this encounter.   Reason COVID-19*  Social distancing based on CDC and AMA recommendations.   I contacted [name] on 01/06/2020 via telephone.      I clearly identified myself as Gaspar Cola, MD. I verified that I was speaking with the correct person using two identifiers (Name: [name], and date of birth: 1955/09/20).  Consent I sought verbal advanced consent from [name] for virtual visit interactions. I informed [name] of possible security and privacy concerns, risks, and limitations associated with providing "not-in-person" medical evaluation and management services. I also informed [name] of the availability of  "in-person" appointments. Finally, I informed her that there would be a charge for the virtual visit and that she could be  personally, fully or partially, financially responsible for it. [name] expressed understanding and agreed to proceed.   Historic Elements   [name] is a 64 y.o. year old, female patient evaluated today after her last contact with our practice on 10/06/2019. [name]  has a past medical history of Acute encephalopathy (05/22/2018), Allergy, Anginal pain ([place]), Anxiety, Ascites, C. difficile colitis (07/10/2015), Cancer ([place]), Cirrhosis of liver not due to alcohol ([place]) (2016), Degenerative disk disease, Diverticulitis, Gastroparesis, GERD (gastroesophageal reflux disease), History of hiatal hernia, Hypertension, Hypothyroid, Hypothyroidism due to amiodarone, Ileus ([place]) (08/01/2015), Intussusception intestine ([place]) (05/2015), Orthostatic hypotension, PAF (paroxysmal atrial fibrillation) ([place]) (03/2015), Pancreatitis, Pneumonia (11/14/2015), Right ureteral stone (07/14/2016), Sepsis ([place]) (12/15/2017), Sick sinus syndrome ([place]), Stomach ulcer, Stroke ([place]), Syncope (01/2015), Syncope due to orthostatic hypotension (05/18/2015), Tachyarrhythmia (01/10/2016), TIA (transient ischemic attack) (02/2015), Type 1 diabetes ([place]), UTERINE CANCER, HX OF (03/27/2007), and UTI (urinary tract infection) (05/22/2018). She also  has a past surgical history that includes Hernia repair; Abdominal hysterectomy; Cholecystectomy; Esophagogastroduodenoscopy (N/A, 04/04/2015); Cardiac catheterization (N/A, 01/12/2016); Esophagogastroduodenoscopy (egd) with propofol (N/A, 01/18/2016); Flexible sigmoidoscopy (N/A, 01/18/2016); Cystoscopy/ureteroscopy/holmium laser (Right, 07/14/2016); Esophagogastroduodenoscopy (N/A, 12/28/2017); Cystoscopy with biopsy (N/A, 12/11/2019); Cystoscopy with fulgeration (N/A, 12/11/2019); Cystoscopy w/ retrogrades (Bilateral, 12/11/2019); Ureteroscopy (Right, 12/11/2019); Cystoscopy with stent  placement (Right, 12/11/2019); and Cystoscopy with urethral dilatation (Right, 12/11/2019). [name] has a current medication list which includes the following prescription(s): albuterol, atorvastatin, cholecalciferol, cyclobenzaprine, accu-chek aviva plus, insulin detemir, magnesium gluconate, nitroglycerin, [START ON 01/09/2020] oxycodone, [START ON 02/08/2020] oxycodone, [START ON 03/09/2020] oxycodone, and sertraline. She  reports that she has quit smoking. Her smoking use  included cigarettes. She has never used smokeless tobacco. She reports that she does not drink alcohol or use drugs. [name] is allergic to aspirin; codeine sulfate; erythromycin; prednisone; rosiglitazone maleate; and tetanus-diphtheria toxoids td.   HPI  Today, she is being contacted for medication management.  The patient indicates doing well with the current medication regimen. No adverse reactions or side effects reported to the medications.   Pharmacotherapy Assessment  Analgesic: Oxycodone IR 5 mg, 1 tab PO q 4 hrs (30 mg/day of oxycodone) MME/day: 45 mg/day.   Monitoring: [place] PMP: PDMP reviewed during this encounter.       Pharmacotherapy: No side-effects or adverse reactions reported. Compliance: No problems identified. Effectiveness: Clinically acceptable. Plan: Refer to "POC".  UDS:  Summary  Date Value Ref Range Status  11/25/2018 FINAL  Final    Comment:    ==================================================================== TOXASSURE COMP DRUG ANALYSIS,UR ==================================================================== Test                             Result       Flag       Units Drug Present and Declared for Prescription Verification   Acetaminophen                  PRESENT      EXPECTED Drug Present not Declared for Prescription Verification   Alcohol, Ethyl                 0.297        UNEXPECTED g/dL    Sources of ethyl alcohol include alcoholic beverages or as a    fermentation product of  glucose; glucose is present in this    specimen.  Interpret result with caution, as the presence of    ethyl alcohol is likely due, at least in part, to fermentation of    glucose.   Cyclobenzaprine                PRESENT      UNEXPECTED   Desmethylcyclobenzaprine       PRESENT      UNEXPECTED    Desmethylcyclobenzaprine is an expected metabolite of    cyclobenzaprine.   Salicylate                     PRESENT      UNEXPECTED   Diphenhydramine                PRESENT      UNEXPECTED Drug Absent but Declared for Prescription Verification   Ibuprofen                      Not Detected UNEXPECTED    Ibuprofen, as indicated in the declared medication list, is not    always detected even when used as directed.   Metoprolol                     Not Detected UNEXPECTED ==================================================================== Test                      Result    Flag   Units      Ref Range   Creatinine              114              mg/dL      >=20 ==================================================================== Declared Medications:  The flagging and  interpretation on this report are based on the  following declared medications.  Unexpected results may arise from  inaccuracies in the declared medications.  **Note: The testing scope of this panel includes these medications:  Metoprolol  **Note: The testing scope of this panel does not include small to  moderate amounts of these reported medications:  Acetaminophen  Ibuprofen  **Note: The testing scope of this panel does not include following  reported medications:  Apixaban  Atorvastatin  Insulin (Lantus) ==================================================================== For clinical consultation, please call [phone]. ====================================================================    Laboratory Chemistry Profile   Renal Lab Results  Component Value Date   BUN 22 12/05/2019   CREATININE 1.43 (H) 12/05/2019    GFR 42.53 (L) 08/18/2019   GFRAA 44 (L) 12/05/2019   GFRNONAA 38 (L) 12/05/2019     Hepatic Lab Results  Component Value Date   AST 24 12/05/2019   ALT 29 12/05/2019   ALBUMIN 3.6 12/05/2019   ALKPHOS 119 12/05/2019   HCVAB 0.2 09/25/2018   AMYLASE 28 08/01/2015   LIPASE 20 12/05/2019   AMMONIA 46 (H) 08/13/2019     Electrolytes Lab Results  Component Value Date   NA 131 (L) 12/05/2019   K 3.8 12/05/2019   CL 100 12/05/2019   CALCIUM 8.9 12/05/2019   MG 2.4 11/27/2019   PHOS 4.6 07/14/2019     Bone Lab Results  Component Value Date   VD25OH 4.7 (L) 11/25/2018     Inflammation (CRP: Acute Phase) (ESR: Chronic Phase) Lab Results  Component Value Date   CRP 10.8 (H) 11/25/2018   ESRSEDRATE 79 (H) 11/25/2018   LATICACIDVEN 0.8 11/24/2019       Note: Above Lab results reviewed.  Imaging  DG OR UROLOGY CYSTO IMAGE ([place]) There is no interpretation for this exam.    This order is for images obtained during a surgical procedure.  Please See  "Surgeries" Tab for more information regarding the procedure.  Assessment  The primary encounter diagnosis was Chronic pain syndrome. Diagnoses of Chronic low back pain (Primary area of Pain) (Bilateral) (L>R) w/ sciatica (Bilateral), Chronic lower extremity pain (Secondary Area of Pain) (Bilateral) (L>R), Fibromyalgia syndrome, and Pharmacologic therapy were also pertinent to this visit.  Plan of Care  Problem-specific:  No problem-specific Assessment & Plan notes found for this encounter.  [name] has a current medication list which includes the following long-term medication(s): albuterol, cyclobenzaprine, insulin detemir, nitroglycerin, [START ON 01/09/2020] oxycodone, [START ON 02/08/2020] oxycodone, [START ON 03/09/2020] oxycodone, and sertraline.  Pharmacotherapy (Medications Ordered): Meds ordered this encounter  Medications  . oxyCODONE (OXY IR/ROXICODONE) 5 MG immediate release tablet    Sig: Take 1  tablet (5 mg total) by mouth every 4 (four) hours as needed for severe pain. Must last 30 days. Max: 6/day    Dispense:  180 tablet    Refill:  0    Chronic Pain: STOP Act (Not applicable) Fill 1 day early if closed on refill date. Do not fill until: 01/09/2020. To last until: 02/08/2020. Avoid benzodiazepines within 8 hours of opioids  . oxyCODONE (OXY IR/ROXICODONE) 5 MG immediate release tablet    Sig: Take 1 tablet (5 mg total) by mouth every 4 (four) hours as needed for severe pain. Must last 30 days. Max: 6/day    Dispense:  180 tablet    Refill:  0    Chronic Pain: STOP Act (Not applicable) Fill 1 day early if closed on refill date. Do not fill  until: 02/08/2020. To last until: 03/09/2020. Avoid benzodiazepines within 8 hours of opioids  . oxyCODONE (OXY IR/ROXICODONE) 5 MG immediate release tablet    Sig: Take 1 tablet (5 mg total) by mouth every 4 (four) hours as needed for severe pain. Must last 30 days. Max: 6/day    Dispense:  180 tablet    Refill:  0    Chronic Pain: STOP Act (Not applicable) Fill 1 day early if closed on refill date. Do not fill until: 6/30/20211. To last until: 04/08/2020. Avoid benzodiazepines within 8 hours of opioids   Orders:  Orders Placed This Encounter  Procedures  . ToxASSURE Select 13 (MW), Urine    Volume: 30 ml(s). Minimum 3 ml of urine is needed. Document temperature of fresh sample. Indications: Long term (current) use of opiate analgesic (L73.736)    Order Specific Question:   Release to patient    Answer:   Immediate   Follow-up plan:   Return in about 3 months (around 04/06/2020) for (F2F), (MM).      Interventional management options:  Considering:   Diagnostic bilateral lumbar facet block  Possible bilateral lumbar facet RFA  Diagnostic bilateral cervical facet block  Possible bilateral cervical facet RFA    PRN Procedures:   None at this time     Recent Visits No visits were found meeting these conditions.  Showing recent visits  within past 90 days and meeting all other requirements   Today's Visits Date Type Provider Dept  01/06/20 Telemedicine Milinda Pointer, MD [facility]  Showing today's visits and meeting all other requirements   Future Appointments No visits were found meeting these conditions.  Showing future appointments within next 90 days and meeting all other requirements   I discussed the assessment and treatment plan with the patient. The patient was provided an opportunity to ask questions and all were answered. The patient agreed with the plan and demonstrated an understanding of the instructions.  Patient advised to call back or seek an in-person evaluation if the symptoms or condition worsens.  Duration of encounter: 12 minutes.  Note by: Gaspar Cola, MD Date: 01/06/2020; Time: 11:51 AM

## 2020-01-06 ENCOUNTER — Telehealth: Payer: Self-pay | Admitting: *Deleted

## 2020-01-06 ENCOUNTER — Other Ambulatory Visit: Payer: Self-pay

## 2020-01-06 ENCOUNTER — Ambulatory Visit: Payer: Medicare HMO | Attending: Pain Medicine | Admitting: Pain Medicine

## 2020-01-06 DIAGNOSIS — M5442 Lumbago with sciatica, left side: Secondary | ICD-10-CM

## 2020-01-06 DIAGNOSIS — M797 Fibromyalgia: Secondary | ICD-10-CM

## 2020-01-06 DIAGNOSIS — G8929 Other chronic pain: Secondary | ICD-10-CM

## 2020-01-06 DIAGNOSIS — G894 Chronic pain syndrome: Secondary | ICD-10-CM

## 2020-01-06 DIAGNOSIS — M79604 Pain in right leg: Secondary | ICD-10-CM

## 2020-01-06 DIAGNOSIS — M5441 Lumbago with sciatica, right side: Secondary | ICD-10-CM

## 2020-01-06 DIAGNOSIS — Z79899 Other long term (current) drug therapy: Secondary | ICD-10-CM

## 2020-01-06 DIAGNOSIS — M79605 Pain in left leg: Secondary | ICD-10-CM

## 2020-01-06 MED ORDER — OXYCODONE HCL 5 MG PO TABS: 5.0000 mg | ORAL_TABLET | ORAL | 0 refills | Status: DC | PRN

## 2020-01-06 NOTE — Telephone Encounter (Signed)
sw pt made her aware to her UDS. Pt stated that she'll come into the clinic on 01/12/20 to complete it.Marland KitchenMarland KitchenTD

## 2020-01-08 DIAGNOSIS — N1832 Chronic kidney disease, stage 3b: Secondary | ICD-10-CM | POA: Diagnosis not present

## 2020-01-08 DIAGNOSIS — I951 Orthostatic hypotension: Secondary | ICD-10-CM | POA: Diagnosis not present

## 2020-01-08 DIAGNOSIS — I495 Sick sinus syndrome: Secondary | ICD-10-CM | POA: Diagnosis not present

## 2020-01-08 DIAGNOSIS — N136 Pyonephrosis: Secondary | ICD-10-CM | POA: Diagnosis not present

## 2020-01-08 DIAGNOSIS — E1165 Type 2 diabetes mellitus with hyperglycemia: Secondary | ICD-10-CM | POA: Diagnosis not present

## 2020-01-08 DIAGNOSIS — I2510 Atherosclerotic heart disease of native coronary artery without angina pectoris: Secondary | ICD-10-CM | POA: Diagnosis not present

## 2020-01-08 DIAGNOSIS — I480 Paroxysmal atrial fibrillation: Secondary | ICD-10-CM | POA: Diagnosis not present

## 2020-01-08 DIAGNOSIS — E1122 Type 2 diabetes mellitus with diabetic chronic kidney disease: Secondary | ICD-10-CM | POA: Diagnosis not present

## 2020-01-08 DIAGNOSIS — I129 Hypertensive chronic kidney disease with stage 1 through stage 4 chronic kidney disease, or unspecified chronic kidney disease: Secondary | ICD-10-CM | POA: Diagnosis not present

## 2020-01-11 ENCOUNTER — Ambulatory Visit: Payer: Medicare HMO | Admitting: Primary Care

## 2020-01-11 ENCOUNTER — Telehealth: Payer: Self-pay | Admitting: *Deleted

## 2020-01-11 DIAGNOSIS — I2510 Atherosclerotic heart disease of native coronary artery without angina pectoris: Secondary | ICD-10-CM | POA: Diagnosis not present

## 2020-01-11 DIAGNOSIS — N1832 Chronic kidney disease, stage 3b: Secondary | ICD-10-CM | POA: Diagnosis not present

## 2020-01-11 DIAGNOSIS — I480 Paroxysmal atrial fibrillation: Secondary | ICD-10-CM | POA: Diagnosis not present

## 2020-01-11 DIAGNOSIS — I951 Orthostatic hypotension: Secondary | ICD-10-CM | POA: Diagnosis not present

## 2020-01-11 DIAGNOSIS — E1122 Type 2 diabetes mellitus with diabetic chronic kidney disease: Secondary | ICD-10-CM | POA: Diagnosis not present

## 2020-01-11 DIAGNOSIS — E1165 Type 2 diabetes mellitus with hyperglycemia: Secondary | ICD-10-CM | POA: Diagnosis not present

## 2020-01-11 DIAGNOSIS — N136 Pyonephrosis: Secondary | ICD-10-CM | POA: Diagnosis not present

## 2020-01-11 DIAGNOSIS — I495 Sick sinus syndrome: Secondary | ICD-10-CM | POA: Diagnosis not present

## 2020-01-11 DIAGNOSIS — I129 Hypertensive chronic kidney disease with stage 1 through stage 4 chronic kidney disease, or unspecified chronic kidney disease: Secondary | ICD-10-CM | POA: Diagnosis not present

## 2020-01-11 NOTE — Telephone Encounter (Signed)
[name] PTA with [facility] called stating that she saw the patient today for physical therapy. [name] stated that patient may be getting another UTI. [name] stated that patient complained of blood in her urine, urine frequency and was more confused today. [name] stated that patient does not have pain or burning. Azalee Course that patient had an appointment today and no showed for the appointment. [name] stated patient probably did not remember the appointment. [name] stated that a lot of people live at her home, but no one drives. [name] stated that patient has a transportation problem and they sent a Education officer, museum out to try and help with that. [name] stated that she is not sure what happened with that.  Called and tried to reach patient and her son answered the phone and stated that his mom was in bed. Patient's son is not on DPR, so message was left for patient to call the office back as soon as she can.

## 2020-01-11 NOTE — Telephone Encounter (Signed)
[name], please follow up on this. We need to see her for follow, she missed her appointment today. Has social work been in touch to help arrange transportation?

## 2020-01-12 NOTE — Telephone Encounter (Signed)
Message left for patient to return my call.  

## 2020-01-13 NOTE — Telephone Encounter (Signed)
Message left for patient to return my call.  

## 2020-01-14 DIAGNOSIS — K5792 Diverticulitis of intestine, part unspecified, without perforation or abscess without bleeding: Secondary | ICD-10-CM

## 2020-01-14 DIAGNOSIS — N1832 Chronic kidney disease, stage 3b: Secondary | ICD-10-CM

## 2020-01-14 DIAGNOSIS — E032 Hypothyroidism due to medicaments and other exogenous substances: Secondary | ICD-10-CM

## 2020-01-14 DIAGNOSIS — S92502D Displaced unspecified fracture of left lesser toe(s), subsequent encounter for fracture with routine healing: Secondary | ICD-10-CM | POA: Diagnosis not present

## 2020-01-14 DIAGNOSIS — G8222 Paraplegia, incomplete: Secondary | ICD-10-CM

## 2020-01-14 DIAGNOSIS — S92902D Unspecified fracture of left foot, subsequent encounter for fracture with routine healing: Secondary | ICD-10-CM

## 2020-01-14 DIAGNOSIS — K219 Gastro-esophageal reflux disease without esophagitis: Secondary | ICD-10-CM

## 2020-01-14 DIAGNOSIS — I2510 Atherosclerotic heart disease of native coronary artery without angina pectoris: Secondary | ICD-10-CM | POA: Diagnosis not present

## 2020-01-14 DIAGNOSIS — I129 Hypertensive chronic kidney disease with stage 1 through stage 4 chronic kidney disease, or unspecified chronic kidney disease: Secondary | ICD-10-CM

## 2020-01-14 DIAGNOSIS — K3184 Gastroparesis: Secondary | ICD-10-CM

## 2020-01-14 DIAGNOSIS — R188 Other ascites: Secondary | ICD-10-CM

## 2020-01-14 DIAGNOSIS — E1022 Type 1 diabetes mellitus with diabetic chronic kidney disease: Secondary | ICD-10-CM

## 2020-01-14 DIAGNOSIS — E860 Dehydration: Secondary | ICD-10-CM | POA: Diagnosis not present

## 2020-01-14 DIAGNOSIS — E1065 Type 1 diabetes mellitus with hyperglycemia: Secondary | ICD-10-CM | POA: Diagnosis not present

## 2020-01-14 DIAGNOSIS — I951 Orthostatic hypotension: Secondary | ICD-10-CM | POA: Diagnosis not present

## 2020-01-14 DIAGNOSIS — K5900 Constipation, unspecified: Secondary | ICD-10-CM

## 2020-01-14 DIAGNOSIS — G894 Chronic pain syndrome: Secondary | ICD-10-CM

## 2020-01-14 DIAGNOSIS — E1043 Type 1 diabetes mellitus with diabetic autonomic (poly)neuropathy: Secondary | ICD-10-CM

## 2020-01-14 DIAGNOSIS — K7460 Unspecified cirrhosis of liver: Secondary | ICD-10-CM

## 2020-01-14 DIAGNOSIS — I495 Sick sinus syndrome: Secondary | ICD-10-CM | POA: Diagnosis not present

## 2020-01-14 DIAGNOSIS — N136 Pyonephrosis: Secondary | ICD-10-CM | POA: Diagnosis not present

## 2020-01-14 DIAGNOSIS — I480 Paroxysmal atrial fibrillation: Secondary | ICD-10-CM | POA: Diagnosis not present

## 2020-01-14 DIAGNOSIS — F329 Major depressive disorder, single episode, unspecified: Secondary | ICD-10-CM

## 2020-01-14 DIAGNOSIS — F411 Generalized anxiety disorder: Secondary | ICD-10-CM

## 2020-01-14 DIAGNOSIS — I69354 Hemiplegia and hemiparesis following cerebral infarction affecting left non-dominant side: Secondary | ICD-10-CM

## 2020-01-15 NOTE — Telephone Encounter (Signed)
Tried to phone patient, no answer, no VM available.

## 2020-01-18 DIAGNOSIS — I2510 Atherosclerotic heart disease of native coronary artery without angina pectoris: Secondary | ICD-10-CM | POA: Diagnosis not present

## 2020-01-18 DIAGNOSIS — I129 Hypertensive chronic kidney disease with stage 1 through stage 4 chronic kidney disease, or unspecified chronic kidney disease: Secondary | ICD-10-CM | POA: Diagnosis not present

## 2020-01-18 DIAGNOSIS — I495 Sick sinus syndrome: Secondary | ICD-10-CM | POA: Diagnosis not present

## 2020-01-18 DIAGNOSIS — E1122 Type 2 diabetes mellitus with diabetic chronic kidney disease: Secondary | ICD-10-CM | POA: Diagnosis not present

## 2020-01-18 DIAGNOSIS — E1165 Type 2 diabetes mellitus with hyperglycemia: Secondary | ICD-10-CM | POA: Diagnosis not present

## 2020-01-18 DIAGNOSIS — I951 Orthostatic hypotension: Secondary | ICD-10-CM | POA: Diagnosis not present

## 2020-01-18 DIAGNOSIS — N1832 Chronic kidney disease, stage 3b: Secondary | ICD-10-CM | POA: Diagnosis not present

## 2020-01-18 DIAGNOSIS — N136 Pyonephrosis: Secondary | ICD-10-CM | POA: Diagnosis not present

## 2020-01-18 DIAGNOSIS — I480 Paroxysmal atrial fibrillation: Secondary | ICD-10-CM | POA: Diagnosis not present

## 2020-01-19 ENCOUNTER — Telehealth: Payer: Self-pay | Admitting: Primary Care

## 2020-01-19 NOTE — Telephone Encounter (Signed)
Gave the approval for the verbal orders 

## 2020-01-19 NOTE — Telephone Encounter (Signed)
Approved.  

## 2020-01-19 NOTE — Telephone Encounter (Signed)
Stacey, [facility], called. Verbal order for Nursing evaluation for medication management 1 x 1 wk. Continue physical therapy for 1 x a week for 2 weeks. After that, Erline Levine said she has to step out because she rarely does physical therapy because they spend the time doing medication management. A detailed message can be left on Stacey's voice mail with orders.

## 2020-01-20 ENCOUNTER — Telehealth: Payer: Self-pay | Admitting: Infectious Diseases

## 2020-01-20 NOTE — Telephone Encounter (Signed)
I attempted to reach [name] and/or patient's caregiver on 01/20/2020 at 11:27 AM to discuss the potential vaccination through our Homebound vaccination initiative.   LVM to callback at [phone] to help arrange for her and her household if interested.    Janene Madeira, MSN, NP-C [facility] [email or web address] Pager: [phone] Office: [phone] [place]: [phone]

## 2020-01-21 ENCOUNTER — Telehealth: Payer: Self-pay | Admitting: Nurse Practitioner

## 2020-01-21 NOTE — Telephone Encounter (Signed)
I connected by phone with [name] and/or patient's caregiver on 01/21/2020 at 3:12 PM to discuss the potential vaccination through our Homebound vaccination initiative.   Prevaccination Checklist for COVID-19 Vaccines  1.  Are you feeling sick today? no  2.  Have you ever received a dose of a COVID-19 vaccine?  no      If yes, which one? None   3.  Have you ever had an allergic reaction: (This would include a severe reaction [ e.g., anaphylaxis] that required treatment with epinephrine or EpiPen or that caused you to go to the hospital.  It would also include an allergic reaction that occurred within 4 hours that caused hives, swelling, or respiratory distress, including wheezing.) A.  A previous dose of COVID-19 vaccine. no  B.  A vaccine or injectable therapy that contains multiple components, one of which is a COVID-19 vaccine component, but it is not known which component elicited the immediate reaction. no  C.  Are you allergic to polyethylene glycol? no   4.  Have you ever had an allergic reaction to another vaccine (other than COVID-19 vaccine) or an injectable medication? (This would include a severe reaction [ e.g., anaphylaxis] that required treatment with epinephrine or EpiPen or that caused you to go to the hospital.  It would also include an allergic reaction that occurred within 4 hours that caused hives, swelling, or respiratory distress, including wheezing.)  no   5.  Have you ever had a severe allergic reaction (e.g., anaphylaxis) to something other than a component of the COVID-19 vaccine, or any vaccine or injectable medication?  This would include food, pet, venom, environmental, or oral medication allergies.  yes   6.  Have you received any vaccine in the last 14 days? no   7.  Have you ever had a positive test for COVID-19 or has a doctor ever told you that you had COVID-19?  no   8.  Have you received passive antibody therapy (monoclonal antibodies or convalescent  serum) as a treatment for COVID-19? no   9.  Do you have a weakened immune system caused by something such as HIV infection or cancer or do you take immunosuppressive drugs or therapies?  no   10.  Do you have a bleeding disorder or are you taking a blood thinner? yes   11.  Are you pregnant or breast-feeding? no   12.  Do you have dermal fillers? no   __________________   This patient is a 64 y.o. female that meets the FDA criteria to receive homebound vaccination. Patient or parent/caregiver understands they have the option to accept or refuse homebound vaccination.  Patient passed the pre-screening checklist and would like to proceed with homebound vaccination.  Based on questionnaire above, I recommend the patient be observed for 30 minutes.  There are an estimated #1 of other household members/caregivers who are also interested in receiving the vaccine.    I will send the patient's information to our scheduling team who will reach out to schedule the patient and potential caregiver/family members for homebound vaccination.    Fenton Foy 01/21/2020 3:12 PM

## 2020-01-21 NOTE — Telephone Encounter (Signed)
I attempted to reach [name] and/or patient's caregiver on 01/21/2020 at 11:27 AM to discuss the potential vaccination through our Homebound vaccination initiative.   LVM to callback at [phone] to help arrange for her and her household if interested.

## 2020-01-22 DIAGNOSIS — R29810 Facial weakness: Secondary | ICD-10-CM | POA: Diagnosis not present

## 2020-01-22 DIAGNOSIS — I959 Hypotension, unspecified: Secondary | ICD-10-CM | POA: Diagnosis not present

## 2020-01-22 DIAGNOSIS — E1165 Type 2 diabetes mellitus with hyperglycemia: Secondary | ICD-10-CM | POA: Diagnosis not present

## 2020-01-22 DIAGNOSIS — G459 Transient cerebral ischemic attack, unspecified: Secondary | ICD-10-CM | POA: Diagnosis not present

## 2020-01-22 DIAGNOSIS — R404 Transient alteration of awareness: Secondary | ICD-10-CM | POA: Diagnosis not present

## 2020-01-23 ENCOUNTER — Emergency Department (HOSPITAL_COMMUNITY)
Admission: EM | Admit: 2020-01-23 | Discharge: 2020-01-23 | Disposition: A | Discharge status: Home / Self Care | Payer: Medicare HMO | Attending: Emergency Medicine | Admitting: Emergency Medicine

## 2020-01-23 ENCOUNTER — Emergency Department (HOSPITAL_COMMUNITY): Payer: Medicare HMO

## 2020-01-23 ENCOUNTER — Encounter (HOSPITAL_COMMUNITY): Payer: Medicare HMO

## 2020-01-23 DIAGNOSIS — I2510 Atherosclerotic heart disease of native coronary artery without angina pectoris: Secondary | ICD-10-CM | POA: Diagnosis not present

## 2020-01-23 DIAGNOSIS — N390 Urinary tract infection, site not specified: Secondary | ICD-10-CM | POA: Diagnosis not present

## 2020-01-23 DIAGNOSIS — N179 Acute kidney failure, unspecified: Secondary | ICD-10-CM

## 2020-01-23 DIAGNOSIS — E860 Dehydration: Secondary | ICD-10-CM | POA: Insufficient documentation

## 2020-01-23 DIAGNOSIS — Z8541 Personal history of malignant neoplasm of cervix uteri: Secondary | ICD-10-CM | POA: Insufficient documentation

## 2020-01-23 DIAGNOSIS — I10 Essential (primary) hypertension: Secondary | ICD-10-CM | POA: Diagnosis not present

## 2020-01-23 DIAGNOSIS — Z79899 Other long term (current) drug therapy: Secondary | ICD-10-CM | POA: Diagnosis not present

## 2020-01-23 DIAGNOSIS — E039 Hypothyroidism, unspecified: Secondary | ICD-10-CM | POA: Insufficient documentation

## 2020-01-23 DIAGNOSIS — Z87891 Personal history of nicotine dependence: Secondary | ICD-10-CM | POA: Insufficient documentation

## 2020-01-23 DIAGNOSIS — G9341 Metabolic encephalopathy: Secondary | ICD-10-CM | POA: Diagnosis not present

## 2020-01-23 DIAGNOSIS — Z8673 Personal history of transient ischemic attack (TIA), and cerebral infarction without residual deficits: Secondary | ICD-10-CM | POA: Insufficient documentation

## 2020-01-23 DIAGNOSIS — R4182 Altered mental status, unspecified: Secondary | ICD-10-CM | POA: Diagnosis present

## 2020-01-23 DIAGNOSIS — R29818 Other symptoms and signs involving the nervous system: Secondary | ICD-10-CM | POA: Diagnosis not present

## 2020-01-23 DIAGNOSIS — R0989 Other specified symptoms and signs involving the circulatory and respiratory systems: Secondary | ICD-10-CM | POA: Diagnosis not present

## 2020-01-23 LAB — CBC
HCT: 28.1 % — ABNORMAL LOW (ref 36.0–46.0)
Hemoglobin: 8.4 g/dL — ABNORMAL LOW (ref 12.0–15.0)
MCH: 25.8 pg — ABNORMAL LOW (ref 26.0–34.0)
MCHC: 29.9 g/dL — ABNORMAL LOW (ref 30.0–36.0)
MCV: 86.5 fL (ref 80.0–100.0)
Platelets: 130 10*3/uL — ABNORMAL LOW (ref 150–400)
RBC: 3.25 MIL/uL — ABNORMAL LOW (ref 3.87–5.11)
RDW: 16 % — ABNORMAL HIGH (ref 11.5–15.5)
WBC: 6.2 10*3/uL (ref 4.0–10.5)
nRBC: 0 % (ref 0.0–0.2)

## 2020-01-23 LAB — URINALYSIS, ROUTINE W REFLEX MICROSCOPIC
Bilirubin Urine: NEGATIVE
Glucose, UA: >=500 mg/dL — AB
Ketones, ur: NEGATIVE mg/dL
Nitrite: NEGATIVE
Protein, ur: 30 mg/dL — AB
RBC / HPF: >50 RBC/hpf — ABNORMAL HIGH (ref 0–5)
Specific Gravity, Urine: 1.003 — ABNORMAL LOW (ref 1.005–1.030)
WBC, UA: >50 WBC/hpf — ABNORMAL HIGH (ref 0–5)
pH: 6 (ref 5.0–8.0)

## 2020-01-23 LAB — DIFFERENTIAL
Abs Immature Granulocytes: 0.03 10*3/uL (ref 0.00–0.07)
Basophils Absolute: 0 10*3/uL (ref 0.0–0.1)
Basophils Relative: 0 %
Eosinophils Absolute: 0.1 10*3/uL (ref 0.0–0.5)
Eosinophils Relative: 2 %
Immature Granulocytes: 1 %
Lymphocytes Relative: 14 %
Lymphs Abs: 0.9 10*3/uL (ref 0.7–4.0)
Monocytes Absolute: 0.4 10*3/uL (ref 0.1–1.0)
Monocytes Relative: 7 %
Neutro Abs: 4.7 10*3/uL (ref 1.7–7.7)
Neutrophils Relative %: 76 %

## 2020-01-23 LAB — CBG MONITORING, ED: Glucose-Capillary: 138 mg/dL — ABNORMAL HIGH (ref 70–99)

## 2020-01-23 LAB — COMPREHENSIVE METABOLIC PANEL
ALT: 42 U/L (ref 0–44)
AST: 37 U/L (ref 15–41)
Albumin: 2.7 g/dL — ABNORMAL LOW (ref 3.5–5.0)
Alkaline Phosphatase: 116 U/L (ref 38–126)
Anion gap: 6 (ref 5–15)
BUN: 14 mg/dL (ref 8–23)
CO2: 20 mmol/L — ABNORMAL LOW (ref 22–32)
Calcium: 8.3 mg/dL — ABNORMAL LOW (ref 8.9–10.3)
Chloride: 109 mmol/L (ref 98–111)
Creatinine, Ser: 1.8 mg/dL — ABNORMAL HIGH (ref 0.44–1.00)
GFR calc Af Amer: 33 mL/min — ABNORMAL LOW (ref >60)
GFR calc non Af Amer: 29 mL/min — ABNORMAL LOW (ref >60)
Glucose, Bld: 144 mg/dL — ABNORMAL HIGH (ref 70–99)
Potassium: 4 mmol/L (ref 3.5–5.1)
Sodium: 135 mmol/L (ref 135–145)
Total Bilirubin: 0.4 mg/dL (ref 0.3–1.2)
Total Protein: 6 g/dL — ABNORMAL LOW (ref 6.5–8.1)

## 2020-01-23 LAB — I-STAT CHEM 8, ED
BUN: 14 mg/dL (ref 8–23)
Calcium, Ion: 1.2 mmol/L (ref 1.15–1.40)
Chloride: 108 mmol/L (ref 98–111)
Creatinine, Ser: 1.9 mg/dL — ABNORMAL HIGH (ref 0.44–1.00)
Glucose, Bld: 140 mg/dL — ABNORMAL HIGH (ref 70–99)
HCT: 27 % — ABNORMAL LOW (ref 36.0–46.0)
Hemoglobin: 9.2 g/dL — ABNORMAL LOW (ref 12.0–15.0)
Potassium: 3.9 mmol/L (ref 3.5–5.1)
Sodium: 137 mmol/L (ref 135–145)
TCO2: 21 mmol/L — ABNORMAL LOW (ref 22–32)

## 2020-01-23 LAB — RAPID URINE DRUG SCREEN, HOSP PERFORMED
Amphetamines: NOT DETECTED
Barbiturates: NOT DETECTED
Benzodiazepines: NOT DETECTED
Cocaine: NOT DETECTED
Opiates: NOT DETECTED
Tetrahydrocannabinol: NOT DETECTED

## 2020-01-23 LAB — PROTIME-INR
INR: 2.6 — ABNORMAL HIGH (ref 0.8–1.2)
Prothrombin Time: 26.8 seconds — ABNORMAL HIGH (ref 11.4–15.2)

## 2020-01-23 LAB — APTT: aPTT: 37 seconds — ABNORMAL HIGH (ref 24–36)

## 2020-01-23 LAB — LACTIC ACID, PLASMA: Lactic Acid, Venous: 1.2 mmol/L (ref 0.5–1.9)

## 2020-01-23 MED ORDER — SODIUM CHLORIDE 0.9 % IV SOLN
1.0000 g | Freq: Once | INTRAVENOUS | Status: AC
  Administered 2020-01-23: 1 g via INTRAVENOUS
  Filled 2020-01-23: qty 10

## 2020-01-23 MED ORDER — LACTATED RINGERS IV BOLUS
1000.0000 mL | Freq: Once | INTRAVENOUS | Status: AC
  Administered 2020-01-23: 1000 mL via INTRAVENOUS

## 2020-01-23 MED ORDER — CEPHALEXIN 500 MG PO CAPS
500.0000 mg | ORAL_CAPSULE | Freq: Four times a day (QID) | ORAL | 0 refills | Status: DC
Start: 2020-01-23 — End: 2020-05-04

## 2020-01-23 MED ORDER — SODIUM CHLORIDE 0.9% FLUSH
3.0000 mL | Freq: Once | INTRAVENOUS | Status: DC
Start: 2020-01-23 — End: 2020-01-23

## 2020-01-23 NOTE — ED Notes (Signed)
assit patient with ambulating patient walked well assitance.she uses walker and acane at home.assit her back in room

## 2020-01-23 NOTE — Consult Note (Signed)
Requesting Physician: Dr. Dolly Rias    Chief Complaint: Altered mental status  History obtained from: Patient and Chart    HPI:                                                                                                                                       [name] is a 64 y.o. female with past medical history significant for hypertension, hypothyroidism, diabetes mellitus,paroxysmal A. fib on Eliquis, cirrhosis of liver, cancer, stroke with minimal left-sided weakness presents as a code stroke after family found her confused at 56 PM.  According to EMS, patient never went to sleep but took her normal medications Zoloft and Flexeril at nighttime.  She was then found to be very confused, lethargic and possibly weak on the left side.  Per EMS her blood pressure initially was 110 but then became hypotensive down to 07M systolic.  Patient takes Eliquis.  On arrival, patient appears lethargic, appeared to be moving her right side more than the left however not fully cooperative.  Stat CT head was obtained which rule out hemorrhage.  tPA was not considered due to low suspicion for stroke as well as patient on anticoagulation.  CTA not obtained due to impaired renal function as well as clinically not LVO.  Patient received IV fluids and reassess the patient-patient was more alert and answering questions appropriately although not oriented to her age and month.  This time no longer having left upper extremity weakness.  States that her left lower leg has been weak over the last several days associated with pain.  Date last known well: 01/22/2020 Time last known well: 11 PM tPA Given: No, On Anticoagulation NIHSS:  Baseline MRS    Past Medical History:  Diagnosis Date  . Acute encephalopathy 05/22/2018  . Allergy   . Anginal pain ([place])   . Anxiety   . Ascites   . C. difficile colitis 07/10/2015  . Cancer (HCC)    HX OF CANCER OF UTERUS   . Cirrhosis of liver not due to alcohol ([place])  2016  . Degenerative disk disease   . Diverticulitis   . Gastroparesis   . GERD (gastroesophageal reflux disease)   . History of hiatal hernia   . Hypertension   . Hypothyroid   . Hypothyroidism due to amiodarone   . Ileus ([place]) 08/01/2015  . Intussusception intestine ([place]) 05/2015  . Orthostatic hypotension   . PAF (paroxysmal atrial fibrillation) ([place]) 03/2015   a. new onset 03/2015 in setting of intractable N/V; b. on Eliquis 5 mg bid; c. CHADSVASc 4 (DM, TIA x 2, female)  . Pancreatitis   . Pneumonia 11/14/2015  . Right ureteral stone 07/14/2016  . Sepsis ([place]) 12/15/2017  . Sick sinus syndrome ([place])   . Stomach ulcer   . Stroke [facility])    with minimal left sided weakness  . Syncope 01/2015  . Syncope due  to orthostatic hypotension 05/18/2015  . Tachyarrhythmia 01/10/2016  . TIA (transient ischemic attack) 02/2015  . Type 1 diabetes ([place])    on levemir  . UTERINE CANCER, HX OF 03/27/2007   Qualifier: Diagnosis of  By: Maxie Better FNP, Rosalita Levan   . UTI (urinary tract infection) 05/22/2018    Past Surgical History:  Procedure Laterality Date  . ABDOMINAL HYSTERECTOMY    . CARDIAC CATHETERIZATION N/A 01/12/2016   Procedure: Left Heart Cath and Coronary Angiography;  Surgeon: Wellington Hampshire, MD;  Location: [place] [facility];  Service: Cardiovascular;  Laterality: N/A;  . CHOLECYSTECTOMY    . CYSTOSCOPY W/ RETROGRADES Bilateral 12/11/2019   Procedure: CYSTOSCOPY WITH RETROGRADE PYELOGRAM;  Surgeon: Billey Co, MD;  Location: [facility] ORS;  Service: Urology;  Laterality: Bilateral;  . CYSTOSCOPY WITH BIOPSY N/A 12/11/2019   Procedure: CYSTOSCOPY WITH BLADDER BIOPSY;  Surgeon: Billey Co, MD;  Location: [facility] OR[facility];  Service: Urology;  Laterality: N/A;  . CYSTOSCOPY WITH FULGERATION N/A 12/11/2019   Procedure: CYSTOSCOPY WITH FULGERATION;  Surgeon: Billey Co, MD;  Location: [facility] OR[facility];  Service: Urology;  Laterality: N/A;  . CYSTOSCOPY WITH STENT PLACEMENT Right 12/11/2019    Procedure: CYSTOSCOPY WITH STENT PLACEMENT;  Surgeon: Billey Co, MD;  Location: [facility] OR[facility];  Service: Urology;  Laterality: Right;  . CYSTOSCOPY WITH URETHRAL DILATATION Right 12/11/2019   Procedure: CYSTOSCOPY WITH URETERAL DILATATION;  Surgeon: Billey Co, MD;  Location: [facility] OR[facility];  Service: Urology;  Laterality: Right;  . CYSTOSCOPY/URETEROSCOPY/HOLMIUM LASER Right 07/14/2016   Procedure: CYSTOSCOPY/URETEROSCOPY/HOLMIUM LASER;  Surgeon: Alexis Frock, MD;  Location: [facility] OR[facility];  Service: Urology;  Laterality: Right;  . ESOPHAGOGASTRODUODENOSCOPY N/A 04/04/2015   Procedure: ESOPHAGOGASTRODUODENOSCOPY (EGD);  Surgeon: Hulen Luster, MD;  Location: [facility];  Service: Endoscopy;  Laterality: N/A;  . ESOPHAGOGASTRODUODENOSCOPY N/A 12/28/2017   Procedure: ESOPHAGOGASTRODUODENOSCOPY (EGD);  Surgeon: Lin Landsman, MD;  Location: [facility] ENDOSCOPY;  Service: Gastroenterology;  Laterality: N/A;  . ESOPHAGOGASTRODUODENOSCOPY (EGD) WITH PROPOFOL N/A 01/18/2016   Procedure: ESOPHAGOGASTRODUODENOSCOPY (EGD) WITH PROPOFOL;  Surgeon: Lucilla Lame, MD;  Location: [facility] ENDOSCOPY;  Service: Endoscopy;  Laterality: N/A;  . FLEXIBLE SIGMOIDOSCOPY N/A 01/18/2016   Procedure: FLEXIBLE SIGMOIDOSCOPY;  Surgeon: Lucilla Lame, MD;  Location: [facility] ENDOSCOPY;  Service: Endoscopy;  Laterality: N/A;  . HERNIA REPAIR    . URETEROSCOPY Right 12/11/2019   Procedure: Christen Butter;  Surgeon: Billey Co, MD;  Location: [facility] ORS;  Service: Urology;  Laterality: Right;    Family History  Problem Relation Age of Onset  . Hypertension Mother   . CAD Sister   . Heart attack Sister        Deceased Nov 08, 2014  . CAD Brother    Social History:  reports that she has quit smoking. Her smoking use included cigarettes. She has never used smokeless tobacco. She reports that she does not drink alcohol or use drugs.  Allergies:  Allergies  Allergen Reactions  . Aspirin Rash  . Codeine Sulfate Rash  . Erythromycin  Rash    Reaction:  Fever   . Prednisone Swelling  . Rosiglitazone Maleate Swelling  . Tetanus-Diphtheria Toxoids Td Rash and Other (See Comments)    Reaction:  Fever     Medications:  I reviewed home medications   ROS:                                                                                                                                     14 systems reviewed and negative except above    Examination:                                                                                                      General: Appears well-developed . Psych: Affect appropriate to situation Eyes: No scleral injection HENT: No OP obstrucion Head: Normocephalic.  Cardiovascular: Normal rate and regular rhythm.  Respiratory: Effort normal and breath sounds normal to anterior ascultation GI: Soft.  No distension. There is no tenderness.  Skin: WDI    Neurological Examination Mental Status: Somnolent but arousable, would state "I do not know" to all the questions.  Does not cooperate for language testing.  No significant slurred speech. Cranial Nerves: II: Visual fields: Blinks to threat bilaterally III,IV, VI: ptosis not present, extra-ocular motions intact bilaterally, pupils equal, round, reactive to light and accommodation VII: smile symmetric VIII: hearing normal bilaterally XI: bilateral shoulder shrug XII: midline tongue extension Motor: Right : Upper extremity   5/5    Left:     Upper extremity   4/5  Lower extremity   4/5     Lower extremity   3/5 Tone and bulk:normal tone throughout; no atrophy noted Sensory: Pinprick and light touch intact throughout, bilaterally Plantars: Right: downgoing   Left: downgoing Cerebellar: No gross ataxia noted out of proportion to weakness    Lab Results: Basic Metabolic Panel: Recent Labs  Lab 01/23/20 0030  NA 137  K  3.9  CL 108  GLUCOSE 140*  BUN 14  CREATININE 1.90*    CBC: Recent Labs  Lab 01/23/20 0030  HGB 9.2*  HCT 27.0*    Coagulation Studies: Recent Labs    01/23/20 0030  LABPROT 26.8*  INR 2.6*    Imaging: CT HEAD CODE STROKE WO CONTRAST  Result Date: 01/23/2020 CLINICAL DATA:  Code stroke.  Left-sided weakness EXAM: CT HEAD WITHOUT CONTRAST TECHNIQUE: Contiguous axial images were obtained from the base of the skull through the vertex without intravenous contrast. COMPARISON:  None. FINDINGS: Brain: There is no mass, hemorrhage or extra-axial collection. The size and configuration of the ventricles and extra-axial CSF spaces are normal. There is hypoattenuation of the periventricular white matter, most commonly indicating chronic ischemic microangiopathy. Vascular: No abnormal hyperdensity of the  major intracranial arteries or dural venous sinuses. No intracranial atherosclerosis. Skull: The visualized skull base, calvarium and extracranial soft tissues are normal. Sinuses/Orbits: No fluid levels or advanced mucosal thickening of the visualized paranasal sinuses. No mastoid or middle ear effusion. The orbits are normal. ASPECTS [facility] Stroke Program Early CT Score) - Ganglionic level infarction (caudate, lentiform nuclei, internal capsule, insula, M1-M3 cortex): 7 - Supraganglionic infarction (M4-M6 cortex): 3 Total score (0-10 with 10 being normal): 10 IMPRESSION: 1. No acute abnormality. 2. ASPECTS is 10. * These results were communicated to Dr. Karena Addison Dejanay Wamboldt at 12:41 am on 01/23/2020 by text page via the [facility] messaging system. Electronically Signed   By: Ulyses Jarred M.D.   On: 01/23/2020 00:41     ASSESSMENT AND PLAN  64 y.o. female with past medical history significant for hypertension, hypothyroidism, diabetes mellitus,paroxysmal A. fib on Eliquis, cirrhosis of liver, cancer, stroke with minimal left-sided weakness presents as a code stroke after family found her confused at 45  PM. Patient states she is no longer taking her normal pain medications, denies taking additional doses of Flexeril Zoloft.   Acute toxic metabolic encephalopathy Cerebral hypoperfusion in the setting of hypotension  Recommendations -IV fluids  -Urine drug screen -Metabolic and infectious work-up including CBC, CMP UA -Consider MRI brain if patient had does not completely return to baseline -Continue Eliquis -Frequent neurochecks  Mazelle Huebert [facility] Pager Number [phone]

## 2020-01-23 NOTE — ED Notes (Signed)
Pt woke pt up to place purewick.  Pt was able to follow directions such as: bend knees, lift bottom, help pull self up in bed.  Pt still has trouble remembering past events but is able to discuss her family and the TV show that was on.

## 2020-01-23 NOTE — ED Notes (Signed)
Pt informed RN that she can not get a ride home, "my family doesn't have a car.... we walk everywhere."  Charge RN made aware.

## 2020-01-23 NOTE — ED Provider Notes (Signed)
[facility]ERGENCY DEPARTMENT Provider Note   CSN: [id number] Arrival date & time: 01/23/20  4580  An emergency department physician performed an initial assessment on this suspected stroke patient at [facility].  History No chief complaint on file.   MICHON KACZMAREK is a 64 y.o. female.  Presented as a code stroke secondary to altered mental status however became quickly apparent that she was actually having a stroke.  Patient was here for global weakness and not feeling well.  Patient states that she took muscle lecture and pain pill before going to sleep and woke up was very confused so that is why EMS was called.  She does have left-sided deficits but this is from an old stroke not new.  No recent illnesses.  No chest pain is new her back pain is new.  Some decreased urination and burning with urination but no other urinary changes.  Has intermittent diarrhea and nausea but nothing consistently.  No fevers.     Past Medical History:  Diagnosis Date  . Acute encephalopathy 05/22/2018  . Allergy   . Anginal pain ([place])   . Anxiety   . Ascites   . C. difficile colitis 07/10/2015  . Cancer (HCC)    HX OF CANCER OF UTERUS   . Cirrhosis of liver not due to alcohol ([place]) 2016  . Degenerative disk disease   . Diverticulitis   . Gastroparesis   . GERD (gastroesophageal reflux disease)   . History of hiatal hernia   . Hypertension   . Hypothyroid   . Hypothyroidism due to amiodarone   . Ileus ([place]) 08/01/2015  . Intussusception intestine ([place]) 05/2015  . Orthostatic hypotension   . PAF (paroxysmal atrial fibrillation) ([place]) 03/2015   a. new onset 03/2015 in setting of intractable N/V; b. on Eliquis 5 mg bid; c. CHADSVASc 4 (DM, TIA x 2, female)  . Pancreatitis   . Pneumonia 11/14/2015  . Right ureteral stone 07/14/2016  . Sepsis ([place]) 12/15/2017  . Sick sinus syndrome ([place])   . Stomach ulcer   . Stroke [facility])    with minimal left sided weakness  . Syncope 01/2015  .  Syncope due to orthostatic hypotension 05/18/2015  . Tachyarrhythmia 01/10/2016  . TIA (transient ischemic attack) 02/2015  . Type 1 diabetes ([place])    on levemir  . UTERINE CANCER, HX OF 03/27/2007   Qualifier: Diagnosis of  By: Maxie Better FNP, Rosalita Levan   . UTI (urinary tract infection) 05/22/2018    Patient Active Problem List   Diagnosis Date Noted  . Sepsis ([place]) 11/23/2019  . Acute pyelonephritis 09/30/2019  . Acute pain of left foot 08/18/2019  . Type II diabetes mellitus with renal manifestations ([place]) 08/13/2019  . Weakness of both lower extremities 07/31/2019  . Altered mental status 07/14/2019  . GAD (generalized anxiety disorder) 06/11/2019  . Poor memory 06/11/2019  . Constipation 05/25/2019  . Acute medial meniscus tear of left knee 05/25/2019  . AKI (acute kidney injury) ([place]) 05/16/2019  . Closed fracture of left distal fibula 05/16/2019  . Sinus tachycardia 05/05/2019  . Congestive dilated cardiomyopathy ([place]) 04/14/2019  . CAD (coronary artery disease) 04/10/2019  . Closed fracture of lateral malleolus 04/10/2019  . Lumbar facet syndrome (Bilateral) (R>L) 03/09/2019  . Long term (current) use of anticoagulants 03/09/2019  . Unspecified cirrhosis of liver ([place]) 02/11/2019  . Hypoalbuminemia 02/11/2019  . Edema due to hypoalbuminemia 02/11/2019  . Lower extremity edema 01/26/2019  . Prolonged QT interval 12/17/2018  .  Hypomagnesemia 12/17/2018  . Uncontrolled type 2 diabetes mellitus ([place]) 12/17/2018  . H/O TIA (transient ischemic attack) and stroke 12/17/2018  . Personal history of surgery to heart and great vessels, presenting hazards to health 12/17/2018  . DDD (degenerative disc disease), lumbar 12/17/2018  . Lumbar intervertebral disc displacement 12/17/2018  . Chronic musculoskeletal pain 12/17/2018  . Chronic generalized pain 12/17/2018  . Hyponatremia 12/17/2018  . Fibromyalgia syndrome 12/17/2018  . Other intervertebral disc displacement, lumbar  region 12/17/2018  . Vitamin D deficiency 11/26/2018  . Elevated C-reactive protein (CRP) 11/26/2018  . Elevated sed rate 11/26/2018  . Chronic low back pain (Primary area of Pain) (Bilateral) (L>R) w/ sciatica (Bilateral) 11/25/2018  . Chronic lower extremity pain (Secondary Area of Pain) (Bilateral) (L>R) 11/25/2018  . Chronic neck pain 11/25/2018  . Pharmacologic therapy 11/25/2018  . Disorder of skeletal system 11/25/2018  . Problems influencing health status 11/25/2018  . Renal mass 10/06/2018  . Chronic intermittent abdominal pain 09/25/2018  . Unspecified abdominal pain 09/25/2018  . Frequent falls 09/12/2018  . Orthostatic hypotension 08/18/2018  . Normochromic normocytic anemia 08/18/2018  . Acute renal failure superimposed on stage 3a chronic kidney disease ([place]) 08/18/2018  . History of hypothyroidism 08/14/2018  . Medical non-compliance 08/14/2018  . Diabetic ketoacidosis ([place]) 05/22/2018  . UTI (urinary tract infection) 05/22/2018  . NSTEMI (non-ST elevated myocardial infarction) ([place]) 01/11/2016  . Tachy-brady syndrome ([place]) 12/28/2015  . PAF (paroxysmal atrial fibrillation) ([place]) 11/24/2015  . Type 2 diabetes mellitus with neurologic complication ([place]) 36/08/2448  . S/P cholecystectomy 11/23/2015  . Narcotic abuse ([place]) 11/22/2015  . Chronic superficial gastritis without bleeding 11/22/2015  . History of Clostridium difficile 11/22/2015  . History of TIA (transient ischemic attack) 11/22/2015  . Mixed hyperlipidemia 11/22/2015  . Diverticulosis 11/22/2015  . History of uterine cancer 11/22/2015  . Hypothyroidism, unspecified 11/22/2015  . Intractable cyclical vomiting with nausea 11/22/2015  . Narcotic withdrawal ([place]) 11/11/2015  . Hyperlipidemia 08/31/2015  . Hypotension 08/06/2015  . Cryptogenic cirrhosis ([place]) 07/10/2015  . Atrial fibrillation ([place]) 07/10/2015  . Malnutrition of moderate degree 07/04/2015  . Symptomatic bradycardia 05/27/2015  . Chronic  anemia 05/19/2015  . Thrombocytopenia ([place]) 05/19/2015  . Hypokalemia 04/06/2015  . Hyperlipidemia with target LDL less than 100 04/06/2015  . Syncope 03/11/2015  . CVA (cerebral vascular accident) ([place]) 02/15/2015  . Essential hypertension 01/12/2015  . Chronically on opiate therapy 01/12/2015  . Cirrhosis of liver not due to alcohol ([place]) 2016  . S/P Nissen fundoplication (without gastrostomy tube) procedure 05/11/2014  . Dysphagia 04/19/2014  . DEPRESSION/ANXIETY 06/27/2007  . Myofascial pain syndrome 06/27/2007  . Chronic pain syndrome 03/28/2007  . GERD 03/27/2007  . Diverticulosis of large intestine without perforation or abscess without bleeding 03/27/2007  . Proteinuria 03/27/2007    Past Surgical History:  Procedure Laterality Date  . ABDOMINAL HYSTERECTOMY    . CARDIAC CATHETERIZATION N/A 01/12/2016   Procedure: Left Heart Cath and Coronary Angiography;  Surgeon: Wellington Hampshire, MD;  Location: [facility];  Service: Cardiovascular;  Laterality: N/A;  . CHOLECYSTECTOMY    . CYSTOSCOPY W/ RETROGRADES Bilateral 12/11/2019   Procedure: CYSTOSCOPY WITH RETROGRADE PYELOGRAM;  Surgeon: Billey Co, MD;  Location: [facility];  Service: Urology;  Laterality: Bilateral;  . CYSTOSCOPY WITH BIOPSY N/A 12/11/2019   Procedure: CYSTOSCOPY WITH BLADDER BIOPSY;  Surgeon: Billey Co, MD;  Location: [facility];  Service: Urology;  Laterality: N/A;  . CYSTOSCOPY WITH FULGERATION N/A 12/11/2019   Procedure: CYSTOSCOPY  WITH FULGERATION;  Surgeon: Billey Co, MD;  Location: [facility];  Service: Urology;  Laterality: N/A;  . CYSTOSCOPY WITH STENT PLACEMENT Right 12/11/2019   Procedure: CYSTOSCOPY WITH STENT PLACEMENT;  Surgeon: Billey Co, MD;  Location: [facility];  Service: Urology;  Laterality: Right;  . CYSTOSCOPY WITH URETHRAL DILATATION Right 12/11/2019   Procedure: CYSTOSCOPY WITH URETERAL DILATATION;  Surgeon: Billey Co, MD;  Location: [facility];  Service: Urology;   Laterality: Right;  . CYSTOSCOPY/URETEROSCOPY/HOLMIUM LASER Right 07/14/2016   Procedure: CYSTOSCOPY/URETEROSCOPY/HOLMIUM LASER;  Surgeon: Alexis Frock, MD;  Location: [facility];  Service: Urology;  Laterality: Right;  . ESOPHAGOGASTRODUODENOSCOPY N/A 04/04/2015   Procedure: ESOPHAGOGASTRODUODENOSCOPY (EGD);  Surgeon: Hulen Luster, MD;  Location: [facility]OSCOPY;  Service: Endoscopy;  Laterality: N/A;  . ESOPHAGOGASTRODUODENOSCOPY N/A 12/28/2017   Procedure: ESOPHAGOGASTRODUODENOSCOPY (EGD);  Surgeon: Lin Landsman, MD;  Location: [facility];  Service: Gastroenterology;  Laterality: N/A;  . ESOPHAGOGASTRODUODENOSCOPY (EGD) WITH PROPOFOL N/A 01/18/2016   Procedure: ESOPHAGOGASTRODUODENOSCOPY (EGD) WITH PROPOFOL;  Surgeon: Lucilla Lame, MD;  Location: [facility];  Service: Endoscopy;  Laterality: N/A;  . FLEXIBLE SIGMOIDOSCOPY N/A 01/18/2016   Procedure: FLEXIBLE SIGMOIDOSCOPY;  Surgeon: Lucilla Lame, MD;  Location: [facility] ENDOS[facility];  Service: Endoscopy;  Laterality: N/A;  . HERNIA REPAIR    . URETEROSCOPY Right 12/11/2019   Procedure: Christen Butter;  Surgeon: Billey Co, MD;  Location: [facility];  Service: Urology;  Laterality: Right;     OB History    Gravida  1   Para  1   Term  1   Preterm      AB      Living        SAB      TAB      Ectopic      Multiple      Live Births              Family History  Problem Relation Age of Onset  . Hypertension Mother   . CAD Sister   . Heart attack Sister        Deceased 11-14-2014  . CAD Brother     Social History   Tobacco Use  . Smoking status: Former Smoker    Types: Cigarettes  . Smokeless tobacco: Never Used  . Tobacco comment: 25 years ago and only smoked occasionally  Substance Use Topics  . Alcohol use: No  . Drug use: No    Home Medications Prior to Admission medications   Medication Sig Start Date End Date Taking? Authorizing Provider  albuterol (PROVENTIL HFA;VENTOLIN HFA) 108 (90 Base)  MCG/ACT inhaler Inhale 2 puffs into the lungs every 6 (six) hours as needed for wheezing or shortness of breath. 11/27/18   Epifanio Lesches, MD  atorvastatin (LIPITOR) 40 MG tablet Take 40 mg by mouth at bedtime.     [provider]  cephALEXin (KEFLEX) 500 MG capsule Take 1 capsule (500 mg total) by mouth 4 (four) times daily. 01/23/20   Valda Christenson, Corene Cornea, MD  cholecalciferol (VITAMIN D3) 25 MCG (1000 UNIT) tablet Take 1,000 Units by mouth daily.    [provider]  cyclobenzaprine (FLEXERIL) 10 MG tablet Take 1 tablet (10 mg total) by mouth 3 (three) times daily as needed for muscle spasms. Must last 30 days. 10/11/19 04/08/20  Milinda Pointer, MD  glucose blood (ACCU-CHEK AVIVA PLUS) test strip Use as instructed to test blood sugar up to 4 times daily 10/09/19   [name], NP  insulin detemir (  LEVEMIR) 100 UNIT/ML FlexPen Inject 30-34 Units into the skin See admin instructions. Inject 34u under the skin every morning and inject 30u under the skin every night 11/27/19   Enzo Bi, MD  magnesium gluconate (MAGONATE) 500 MG tablet Take 500 mg by mouth 2 (two) times daily.    [provider]  nitroGLYCERIN (NITROSTAT) 0.3 MG SL tablet Place 1 tablet (0.3 mg total) under the tongue every 5 (five) minutes as needed for chest pain. 07/24/19 07/23/20  Nena Polio, MD  oxyCODONE (OXY IR/ROXICODONE) 5 MG immediate release tablet Take 1 tablet (5 mg total) by mouth every 4 (four) hours as needed for severe pain. Must last 30 days. Max: 6/day 01/09/20 02/08/20  Milinda Pointer, MD  oxyCODONE (OXY IR/ROXICODONE) 5 MG immediate release tablet Take 1 tablet (5 mg total) by mouth every 4 (four) hours as needed for severe pain. Must last 30 days. Max: 6/day 02/08/20 03/09/20  Milinda Pointer, MD  oxyCODONE (OXY IR/ROXICODONE) 5 MG immediate release tablet Take 1 tablet (5 mg total) by mouth every 4 (four) hours as needed for severe pain. Must last 30 days. Max: 6/day 03/09/20  04/08/20  Milinda Pointer, MD  sertraline (ZOLOFT) 50 MG tablet TAKE ONE TABLET EVERY DAY 12/24/19   [name], NP    Allergies    Aspirin, Codeine sulfate, Erythromycin, Prednisone, Rosiglitazone maleate, and Tetanus-diphtheria toxoids td  Review of Systems   Review of Systems  All other systems reviewed and are negative.   Physical Exam Updated Vital Signs BP 124/60 (BP Location: Right Arm)   Pulse 62   Temp (!) 97.4 F (36.3 C) (Oral)   Resp 14   Ht 5' 3"  (1.6 m)   SpO2 100%   BMI 29.76 kg/m   Physical Exam Vitals and nursing note reviewed.  Constitutional:      Appearance: She is well-developed.  HENT:     Head: Normocephalic and atraumatic.     Mouth/Throat:     Mouth: Mucous membranes are dry.     Pharynx: Oropharynx is clear.  Eyes:     Conjunctiva/sclera: Conjunctivae normal.  Cardiovascular:     Rate and Rhythm: Normal rate and regular rhythm.     Heart sounds: No murmur.  Pulmonary:     Effort: No respiratory distress.     Breath sounds: No stridor.  Abdominal:     General: There is no distension.  Musculoskeletal:        General: No swelling or tenderness. Normal range of motion.     Cervical back: Normal range of motion.  Skin:    General: Skin is warm and dry.     Findings: No bruising.  Neurological:     Mental Status: She is alert.     Motor: Weakness (LUE and LLE) present.     ED Results / Procedures / Treatments   Labs (all labs ordered are listed, but only abnormal results are displayed) Labs Reviewed  PROTIME-INR - Abnormal; Notable for the following components:      Result Value   Prothrombin Time 26.8 (*)    INR 2.6 (*)    All other components within normal limits  APTT - Abnormal; Notable for the following components:   aPTT 37 (*)    All other components within normal limits  CBC - Abnormal; Notable for the following components:   RBC 3.25 (*)    Hemoglobin 8.4 (*)    HCT 28.1 (*)    MCH 25.8 (*)  MCHC 29.9 (*)     RDW 16.0 (*)    Platelets 130 (*)    All other components within normal limits  COMPREHENSIVE METABOLIC PANEL - Abnormal; Notable for the following components:   CO2 20 (*)    Glucose, Bld 144 (*)    Creatinine, Ser 1.80 (*)    Calcium 8.3 (*)    Total Protein 6.0 (*)    Albumin 2.7 (*)    GFR calc non Af Amer 29 (*)    GFR calc Af Amer 33 (*)    All other components within normal limits  URINALYSIS, ROUTINE W REFLEX MICROSCOPIC - Abnormal; Notable for the following components:   APPearance HAZY (*)    Specific Gravity, Urine 1.003 (*)    Glucose, UA >=500 (*)    Hgb urine dipstick LARGE (*)    Protein, ur 30 (*)    Leukocytes,Ua MODERATE (*)    RBC / HPF >50 (*)    WBC, UA >50 (*)    Bacteria, UA MANY (*)    All other components within normal limits  I-STAT CHEM 8, ED - Abnormal; Notable for the following components:   Creatinine, Ser 1.90 (*)    Glucose, Bld 140 (*)    TCO2 21 (*)    Hemoglobin 9.2 (*)    HCT 27.0 (*)    All other components within normal limits  CBG MONITORING, ED - Abnormal; Notable for the following components:   Glucose-Capillary 138 (*)    All other components within normal limits  URINE CULTURE  DIFFERENTIAL  RAPID URINE DRUG SCREEN, HOSP PERFORMED  LACTIC ACID, PLASMA    EKG EKG Interpretation  Date/Time:  Saturday Jan 23 2020 00:46:35 EDT Ventricular Rate:  71 PR Interval:    QRS Duration: 100 QT Interval:  480 QTC Calculation: 522 R Axis:   58 Text Interpretation: Sinus rhythm Anterior infarct, old Prolonged QT interval improved LVH since previously Confirmed by Merrily Pew [phone]) on 01/23/2020 1:08:10 AM   Radiology DG Chest Portable 1 View  Result Date: 01/23/2020 CLINICAL DATA:  Weakness and hypotension EXAM: PORTABLE CHEST 1 VIEW COMPARISON:  November 23, 2019 FINDINGS: There is unchanged cardiomegaly. Aortic knob calcifications. There is mild prominence of the central pulmonary vasculature. No large airspace consolidation or  pleural effusion. IMPRESSION: Mild cardiomegaly and pulmonary vascular congestion Electronically Signed   By: Prudencio Pair M.D.   On: 01/23/2020 01:35   CT HEAD CODE STROKE WO CONTRAST  Result Date: 01/23/2020 CLINICAL DATA:  Code stroke.  Left-sided weakness EXAM: CT HEAD WITHOUT CONTRAST TECHNIQUE: Contiguous axial images were obtained from the base of the skull through the vertex without intravenous contrast. COMPARISON:  None. FINDINGS: Brain: There is no mass, hemorrhage or extra-axial collection. The size and configuration of the ventricles and extra-axial CSF spaces are normal. There is hypoattenuation of the periventricular white matter, most commonly indicating chronic ischemic microangiopathy. Vascular: No abnormal hyperdensity of the major intracranial arteries or dural venous sinuses. No intracranial atherosclerosis. Skull: The visualized skull base, calvarium and extracranial soft tissues are normal. Sinuses/Orbits: No fluid levels or advanced mucosal thickening of the visualized paranasal sinuses. No mastoid or middle ear effusion. The orbits are normal. ASPECTS [facility] Stroke Program Early CT Score) - Ganglionic level infarction (caudate, lentiform nuclei, internal capsule, insula, M1-M3 cortex): 7 - Supraganglionic infarction (M4-M6 cortex): 3 Total score (0-10 with 10 being normal): 10 IMPRESSION: 1. No acute abnormality. 2. ASPECTS is 10. * These results were communicated to Dr.  Sushanth Aroor at 12:41 am on 01/23/2020 by text page via the [facility] messaging system. Electronically Signed   By: Ulyses Jarred M.D.   On: 01/23/2020 00:41    Procedures Procedures (including critical care time)  Medications Ordered in ED Medications  lactated ringers bolus 1,000 mL (0 mLs Intravenous Stopped 01/23/20 0228)  lactated ringers bolus 1,000 mL (0 mLs Intravenous Stopped 01/23/20 0228)  cefTRIAXone (ROCEPHIN) 1 g in sodium chloride 0.9 % 100 mL IVPB (0 g Intravenous Stopped 01/23/20 0548)    ED  Course  I have reviewed the triage vital signs and the nursing notes.  Pertinent labs & imaging results that were available during my care of the patient were reviewed by me and considered in my medical decision making (see chart for details).    MDM Rules/Calculators/A&P                      Patient with some hypotension when she first got here which responded well to fluids.  Mental status is back to baseline.  Her blood pressures are stable.  She has left-sided weakness but she states is from her old stroke and is not new.  Her work-up is negative from a cerebrovascular standpoint.  Patient weakness is more likely global and less likely to be neurologic in nature.   UTI. Culture sent. Start antibiotics. No fever, persistent hypotension, leukocytosis to suggest sepsis. Likely be able to dc, will check orthostatics first.   Patient with prolonged evaluation emergency room with stable vital signs after her initial mild hypotension had resolved with fluids.  Patient feels like she is at her baseline.  Her symptoms of all improved is likely stroke reactivation versus actual new ischemic event.  She is found to have a UTI as noted above but once again I do not think she is septic and she is definitely not septic shock with normal lactic acid is x2 and normal blood pressures x4 hours.  Shared decision making with the patient and she prefers to go home to continue antibiotics and rehydrate with PCP follow-up.  Out for possible observation however would likely be very short stay as she is asymptomatic with normal vital signs and no real need for inpatient management at this time.  Final Clinical Impression(s) / ED Diagnoses Final diagnoses:  Dehydration  AKI (acute kidney injury) ([place])  Urinary tract infection without hematuria, site unspecified    Rx / DC Orders ED Discharge Orders         Ordered    cephALEXin (KEFLEX) 500 MG capsule  4 times daily     01/23/20 0538           Haeli Gerlich,  Corene Cornea, MD 01/24/20 0004

## 2020-01-23 NOTE — ED Triage Notes (Signed)
Pt coming from home after complaints of confusion by family living with her. Pt LKN @ 2200 and took night time meds (Zoloft and Flexeril). Normally A&O x4, but now is disoriented x4. Left sided weakness and lethargic. Pt's initialy BP 110, then became hypotensive in route, 64/40 at the lowest point. CBG 266. Hx of stroke, takes Eliquis, and hx of a-fib. Code stroke called

## 2020-01-23 NOTE — ED Notes (Addendum)
Patient given discharge instructions. Questions were answered. Patient verbalized understanding of discharge instructions and care at home.  Pt given cab voucher by social work. Discharged in [place].

## 2020-01-24 LAB — URINE CULTURE: Culture: <10000 — AB

## 2020-01-25 DIAGNOSIS — I495 Sick sinus syndrome: Secondary | ICD-10-CM | POA: Diagnosis not present

## 2020-01-25 DIAGNOSIS — E1165 Type 2 diabetes mellitus with hyperglycemia: Secondary | ICD-10-CM | POA: Diagnosis not present

## 2020-01-25 DIAGNOSIS — I480 Paroxysmal atrial fibrillation: Secondary | ICD-10-CM | POA: Diagnosis not present

## 2020-01-25 DIAGNOSIS — I129 Hypertensive chronic kidney disease with stage 1 through stage 4 chronic kidney disease, or unspecified chronic kidney disease: Secondary | ICD-10-CM | POA: Diagnosis not present

## 2020-01-25 DIAGNOSIS — I2510 Atherosclerotic heart disease of native coronary artery without angina pectoris: Secondary | ICD-10-CM | POA: Diagnosis not present

## 2020-01-25 DIAGNOSIS — E1122 Type 2 diabetes mellitus with diabetic chronic kidney disease: Secondary | ICD-10-CM | POA: Diagnosis not present

## 2020-01-25 DIAGNOSIS — N1832 Chronic kidney disease, stage 3b: Secondary | ICD-10-CM | POA: Diagnosis not present

## 2020-01-25 DIAGNOSIS — I951 Orthostatic hypotension: Secondary | ICD-10-CM | POA: Diagnosis not present

## 2020-01-25 DIAGNOSIS — N136 Pyonephrosis: Secondary | ICD-10-CM | POA: Diagnosis not present

## 2020-01-26 ENCOUNTER — Telehealth: Payer: Self-pay

## 2020-01-26 DIAGNOSIS — I129 Hypertensive chronic kidney disease with stage 1 through stage 4 chronic kidney disease, or unspecified chronic kidney disease: Secondary | ICD-10-CM | POA: Diagnosis not present

## 2020-01-26 DIAGNOSIS — I495 Sick sinus syndrome: Secondary | ICD-10-CM | POA: Diagnosis not present

## 2020-01-26 DIAGNOSIS — I2510 Atherosclerotic heart disease of native coronary artery without angina pectoris: Secondary | ICD-10-CM | POA: Diagnosis not present

## 2020-01-26 DIAGNOSIS — E1122 Type 2 diabetes mellitus with diabetic chronic kidney disease: Secondary | ICD-10-CM | POA: Diagnosis not present

## 2020-01-26 DIAGNOSIS — I951 Orthostatic hypotension: Secondary | ICD-10-CM | POA: Diagnosis not present

## 2020-01-26 DIAGNOSIS — N1832 Chronic kidney disease, stage 3b: Secondary | ICD-10-CM | POA: Diagnosis not present

## 2020-01-26 DIAGNOSIS — I480 Paroxysmal atrial fibrillation: Secondary | ICD-10-CM | POA: Diagnosis not present

## 2020-01-26 DIAGNOSIS — N136 Pyonephrosis: Secondary | ICD-10-CM | POA: Diagnosis not present

## 2020-01-26 DIAGNOSIS — E1165 Type 2 diabetes mellitus with hyperglycemia: Secondary | ICD-10-CM | POA: Diagnosis not present

## 2020-01-26 NOTE — Telephone Encounter (Signed)
Floyd with [facility] left v/m requesting verbal orders for [facility] skilled nursing 1 more time this wk, 2 x a wk for 1 wk and  1 x a wk for 3 wks. Can leave detailed message on Lindsey's phone.

## 2020-01-26 NOTE — Telephone Encounter (Signed)
Approved.  

## 2020-01-27 NOTE — Telephone Encounter (Signed)
Gave the approval for the verbal orders 

## 2020-01-28 ENCOUNTER — Telehealth: Payer: Self-pay | Admitting: Primary Care

## 2020-01-28 NOTE — Telephone Encounter (Signed)
Spoken to patient and was able to schedule appointment on Tuesday 02/02/2020

## 2020-01-28 NOTE — Telephone Encounter (Signed)
Patient called She stated that she was told by her PT that she needed to contact [facility] or [place]. That we have been reaching out to her.   Patient is requesting call back

## 2020-01-29 DIAGNOSIS — I495 Sick sinus syndrome: Secondary | ICD-10-CM | POA: Diagnosis not present

## 2020-01-29 DIAGNOSIS — N1832 Chronic kidney disease, stage 3b: Secondary | ICD-10-CM | POA: Diagnosis not present

## 2020-01-29 DIAGNOSIS — E1122 Type 2 diabetes mellitus with diabetic chronic kidney disease: Secondary | ICD-10-CM | POA: Diagnosis not present

## 2020-01-29 DIAGNOSIS — E1165 Type 2 diabetes mellitus with hyperglycemia: Secondary | ICD-10-CM | POA: Diagnosis not present

## 2020-01-29 DIAGNOSIS — N136 Pyonephrosis: Secondary | ICD-10-CM | POA: Diagnosis not present

## 2020-01-29 DIAGNOSIS — I951 Orthostatic hypotension: Secondary | ICD-10-CM | POA: Diagnosis not present

## 2020-01-29 DIAGNOSIS — I2510 Atherosclerotic heart disease of native coronary artery without angina pectoris: Secondary | ICD-10-CM | POA: Diagnosis not present

## 2020-01-29 DIAGNOSIS — I480 Paroxysmal atrial fibrillation: Secondary | ICD-10-CM | POA: Diagnosis not present

## 2020-01-29 DIAGNOSIS — I129 Hypertensive chronic kidney disease with stage 1 through stage 4 chronic kidney disease, or unspecified chronic kidney disease: Secondary | ICD-10-CM | POA: Diagnosis not present

## 2020-02-01 DIAGNOSIS — N1832 Chronic kidney disease, stage 3b: Secondary | ICD-10-CM | POA: Diagnosis not present

## 2020-02-01 DIAGNOSIS — I480 Paroxysmal atrial fibrillation: Secondary | ICD-10-CM | POA: Diagnosis not present

## 2020-02-01 DIAGNOSIS — I951 Orthostatic hypotension: Secondary | ICD-10-CM | POA: Diagnosis not present

## 2020-02-01 DIAGNOSIS — N136 Pyonephrosis: Secondary | ICD-10-CM | POA: Diagnosis not present

## 2020-02-01 DIAGNOSIS — I129 Hypertensive chronic kidney disease with stage 1 through stage 4 chronic kidney disease, or unspecified chronic kidney disease: Secondary | ICD-10-CM | POA: Diagnosis not present

## 2020-02-01 DIAGNOSIS — I495 Sick sinus syndrome: Secondary | ICD-10-CM | POA: Diagnosis not present

## 2020-02-01 DIAGNOSIS — I2510 Atherosclerotic heart disease of native coronary artery without angina pectoris: Secondary | ICD-10-CM | POA: Diagnosis not present

## 2020-02-01 DIAGNOSIS — E1165 Type 2 diabetes mellitus with hyperglycemia: Secondary | ICD-10-CM | POA: Diagnosis not present

## 2020-02-01 DIAGNOSIS — E1122 Type 2 diabetes mellitus with diabetic chronic kidney disease: Secondary | ICD-10-CM | POA: Diagnosis not present

## 2020-02-02 ENCOUNTER — Ambulatory Visit: Payer: Medicare HMO | Admitting: Primary Care

## 2020-02-02 DIAGNOSIS — E1122 Type 2 diabetes mellitus with diabetic chronic kidney disease: Secondary | ICD-10-CM | POA: Diagnosis not present

## 2020-02-02 DIAGNOSIS — N136 Pyonephrosis: Secondary | ICD-10-CM | POA: Diagnosis not present

## 2020-02-02 DIAGNOSIS — I495 Sick sinus syndrome: Secondary | ICD-10-CM | POA: Diagnosis not present

## 2020-02-02 DIAGNOSIS — I129 Hypertensive chronic kidney disease with stage 1 through stage 4 chronic kidney disease, or unspecified chronic kidney disease: Secondary | ICD-10-CM | POA: Diagnosis not present

## 2020-02-02 DIAGNOSIS — I2510 Atherosclerotic heart disease of native coronary artery without angina pectoris: Secondary | ICD-10-CM | POA: Diagnosis not present

## 2020-02-02 DIAGNOSIS — N1832 Chronic kidney disease, stage 3b: Secondary | ICD-10-CM | POA: Diagnosis not present

## 2020-02-02 DIAGNOSIS — I951 Orthostatic hypotension: Secondary | ICD-10-CM | POA: Diagnosis not present

## 2020-02-02 DIAGNOSIS — I480 Paroxysmal atrial fibrillation: Secondary | ICD-10-CM | POA: Diagnosis not present

## 2020-02-02 DIAGNOSIS — E1165 Type 2 diabetes mellitus with hyperglycemia: Secondary | ICD-10-CM | POA: Diagnosis not present

## 2020-02-04 ENCOUNTER — Other Ambulatory Visit: Payer: Self-pay | Admitting: Primary Care

## 2020-02-04 DIAGNOSIS — F411 Generalized anxiety disorder: Secondary | ICD-10-CM

## 2020-02-04 DIAGNOSIS — G894 Chronic pain syndrome: Secondary | ICD-10-CM | POA: Diagnosis not present

## 2020-02-04 DIAGNOSIS — Z79899 Other long term (current) drug therapy: Secondary | ICD-10-CM | POA: Diagnosis not present

## 2020-02-08 LAB — TOXASSURE SELECT 13 (MW), URINE

## 2020-02-11 ENCOUNTER — Ambulatory Visit: Payer: Medicare HMO

## 2020-02-17 ENCOUNTER — Ambulatory Visit: Payer: Medicare HMO | Admitting: Urology

## 2020-02-17 ENCOUNTER — Ambulatory Visit: Payer: Medicare HMO | Attending: Critical Care Medicine

## 2020-02-17 DIAGNOSIS — Z23 Encounter for immunization: Secondary | ICD-10-CM

## 2020-02-17 NOTE — Progress Notes (Signed)
   Covid-19 Vaccination Clinic  Name:  [name]    MRN: [id number] DOB: May 29, 1956  02/17/2020  [name] was observed post Covid-19 immunization for 15 minutes without incident. She was provided with Vaccine Information Sheet and instruction to access the V-Safe system.   [name] was instructed to call 911 with any severe reactions post vaccine: Marland Kitchen Difficulty breathing  . Swelling of face and throat  . A fast heartbeat  . A bad rash all over body  . Dizziness and weakness   Immunizations Administered    Name Date Dose VIS Date Route   Moderna COVID-19 Vaccine 02/17/2020  3:00 PM 0.5 mL 08/2019 Intramuscular   Manufacturer: Moderna   Lot: [id number]   [place]: [id number]

## 2020-02-20 ENCOUNTER — Emergency Department: Payer: Medicare HMO

## 2020-02-20 ENCOUNTER — Other Ambulatory Visit: Payer: Self-pay

## 2020-02-20 ENCOUNTER — Encounter: Payer: Self-pay | Admitting: Emergency Medicine

## 2020-02-20 ENCOUNTER — Emergency Department
Admission: EM | Admit: 2020-02-20 | Discharge: 2020-02-21 | Disposition: A | Discharge status: Home / Self Care | Payer: Medicare HMO | Attending: Student | Admitting: Student

## 2020-02-20 DIAGNOSIS — I10 Essential (primary) hypertension: Secondary | ICD-10-CM | POA: Insufficient documentation

## 2020-02-20 DIAGNOSIS — R079 Chest pain, unspecified: Secondary | ICD-10-CM

## 2020-02-20 DIAGNOSIS — Z79899 Other long term (current) drug therapy: Secondary | ICD-10-CM | POA: Insufficient documentation

## 2020-02-20 DIAGNOSIS — R0689 Other abnormalities of breathing: Secondary | ICD-10-CM | POA: Diagnosis not present

## 2020-02-20 DIAGNOSIS — E119 Type 2 diabetes mellitus without complications: Secondary | ICD-10-CM | POA: Diagnosis not present

## 2020-02-20 DIAGNOSIS — R0602 Shortness of breath: Secondary | ICD-10-CM | POA: Diagnosis not present

## 2020-02-20 DIAGNOSIS — R0789 Other chest pain: Secondary | ICD-10-CM | POA: Insufficient documentation

## 2020-02-20 DIAGNOSIS — Z794 Long term (current) use of insulin: Secondary | ICD-10-CM | POA: Diagnosis not present

## 2020-02-20 DIAGNOSIS — Z87891 Personal history of nicotine dependence: Secondary | ICD-10-CM | POA: Insufficient documentation

## 2020-02-20 DIAGNOSIS — I2510 Atherosclerotic heart disease of native coronary artery without angina pectoris: Secondary | ICD-10-CM | POA: Diagnosis not present

## 2020-02-20 DIAGNOSIS — R05 Cough: Secondary | ICD-10-CM | POA: Diagnosis not present

## 2020-02-20 DIAGNOSIS — E039 Hypothyroidism, unspecified: Secondary | ICD-10-CM | POA: Insufficient documentation

## 2020-02-20 DIAGNOSIS — J90 Pleural effusion, not elsewhere classified: Secondary | ICD-10-CM | POA: Diagnosis not present

## 2020-02-20 DIAGNOSIS — E1165 Type 2 diabetes mellitus with hyperglycemia: Secondary | ICD-10-CM | POA: Diagnosis not present

## 2020-02-20 LAB — COMPREHENSIVE METABOLIC PANEL
ALT: 21 U/L (ref 0–44)
AST: 24 U/L (ref 15–41)
Albumin: 3.3 g/dL — ABNORMAL LOW (ref 3.5–5.0)
Alkaline Phosphatase: 111 U/L (ref 38–126)
Anion gap: 9 (ref 5–15)
BUN: 11 mg/dL (ref 8–23)
CO2: 24 mmol/L (ref 22–32)
Calcium: 8.1 mg/dL — ABNORMAL LOW (ref 8.9–10.3)
Chloride: 102 mmol/L (ref 98–111)
Creatinine, Ser: 1.31 mg/dL — ABNORMAL HIGH (ref 0.44–1.00)
GFR calc Af Amer: 49 mL/min — ABNORMAL LOW (ref >60)
GFR calc non Af Amer: 42 mL/min — ABNORMAL LOW (ref >60)
Glucose, Bld: 296 mg/dL — ABNORMAL HIGH (ref 70–99)
Potassium: 3.8 mmol/L (ref 3.5–5.1)
Sodium: 135 mmol/L (ref 135–145)
Total Bilirubin: 0.4 mg/dL (ref 0.3–1.2)
Total Protein: 6.4 g/dL — ABNORMAL LOW (ref 6.5–8.1)

## 2020-02-20 LAB — URINALYSIS, COMPLETE (UACMP) WITH MICROSCOPIC
Bacteria, UA: NONE SEEN
Bilirubin Urine: NEGATIVE
Glucose, UA: >=500 mg/dL — AB
Hgb urine dipstick: NEGATIVE
Ketones, ur: NEGATIVE mg/dL
Nitrite: NEGATIVE
Protein, ur: NEGATIVE mg/dL
Specific Gravity, Urine: 1.006 (ref 1.005–1.030)
pH: 6 (ref 5.0–8.0)

## 2020-02-20 LAB — CBC
HCT: 26.4 % — ABNORMAL LOW (ref 36.0–46.0)
Hemoglobin: 8.3 g/dL — ABNORMAL LOW (ref 12.0–15.0)
MCH: 24.6 pg — ABNORMAL LOW (ref 26.0–34.0)
MCHC: 31.4 g/dL (ref 30.0–36.0)
MCV: 78.3 fL — ABNORMAL LOW (ref 80.0–100.0)
Platelets: 161 10*3/uL (ref 150–400)
RBC: 3.37 MIL/uL — ABNORMAL LOW (ref 3.87–5.11)
RDW: 15.7 % — ABNORMAL HIGH (ref 11.5–15.5)
WBC: 4.3 10*3/uL (ref 4.0–10.5)
nRBC: 0 % (ref 0.0–0.2)

## 2020-02-20 LAB — TROPONIN I (HIGH SENSITIVITY): Troponin I (High Sensitivity): 6 ng/L (ref <18)

## 2020-02-20 LAB — PROCALCITONIN: Procalcitonin: <0.1 ng/mL

## 2020-02-20 MED ORDER — SODIUM CHLORIDE 0.9% FLUSH: 3.0000 mL | Freq: Once | INTRAVENOUS | Status: DC

## 2020-02-20 NOTE — ED Triage Notes (Signed)
Patient presents to Emergency Department via [facility] from home with complaints of SOB and CP (center sternum sharp with left arm radiation). Pt took 3 x nitro and EMS gave one SL nitro - both brought pain to 7 from 10 but pain has rebounded back to 10 sharp and constant.  Pt reports productive cough form the last 7 days and N/V for the last 24 hours.    History of MI "some years back" and 2 "mini strokes with left leg deficit"  DM, pt takes oxycodone for back prob, 1 kidney nonfunctioning, and non-alcoholic cirrhosis
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
## 2020-02-21 ENCOUNTER — Encounter: Payer: Self-pay | Admitting: Radiology

## 2020-02-21 ENCOUNTER — Emergency Department: Payer: Medicare HMO

## 2020-02-21 DIAGNOSIS — R0789 Other chest pain: Secondary | ICD-10-CM | POA: Diagnosis not present

## 2020-02-21 DIAGNOSIS — R079 Chest pain, unspecified: Secondary | ICD-10-CM | POA: Diagnosis not present

## 2020-02-21 LAB — TROPONIN I (HIGH SENSITIVITY): Troponin I (High Sensitivity): 7 ng/L (ref <18)

## 2020-02-21 MED ORDER — IOHEXOL 350 MG/ML SOLN
75.0000 mL | Freq: Once | INTRAVENOUS | Status: AC | PRN
  Administered 2020-02-21: 75 mL via INTRAVENOUS

## 2020-02-21 MED ORDER — FENTANYL CITRATE (PF) 100 MCG/2ML IJ SOLN
50.0000 ug | Freq: Once | INTRAMUSCULAR | Status: AC
  Administered 2020-02-21: 50 ug via INTRAVENOUS
  Filled 2020-02-21: qty 2

## 2020-02-21 NOTE — ED Provider Notes (Signed)
_________________________ 1:00 AM on 02/21/2020 -----------------------------------------  Accepted care of this patient at 11:30 PM.  Plan to recheck second troponin and discharge if negative.  Second troponin just came back negative.  Went to reassess patient was still complaining of 10 out of 10 sharp left-sided chest pain radiating down her left arm.  Low suspicion for PE since patient is on Eliquis, has no tachycardia or hypoxia.  We will send patient for CT angio to rule out dissection.  She is also complaining of cough for the last 2 days which is productive, nausea and vomiting today.  No fever or leukocytosis.  Chest x-ray concerning for possible pneumonia however procalcitonin is negative.  CT will help [name] clarify this.  The meantime we will give her a dose of IV fentanyl for pain.  _________________________ 3:06 AM on 02/21/2020 -----------------------------------------  CT negative for any acute pathology.  Pain resolved.  At this time patient stable for discharge and follow-up with her PCP.  Discussed my return precautions.    CRITICAL CARE Performed by: Rudene Re  ?  Total critical care time: 30 min  Critical care time was exclusive of separately billable procedures and treating other patients.  Critical care was necessary to treat or prevent imminent or life-threatening deterioration.  Critical care was time spent personally by me on the following activities: development of treatment plan with patient and/or surrogate as well as nursing, discussions with consultants, evaluation of patient's response to treatment, examination of patient, obtaining history from patient or surrogate, ordering and performing treatments and interventions, ordering and review of laboratory studies, ordering and review of radiographic studies, pulse oximetry and re-evaluation of patient's condition.   I have personally reviewed the images performed during this visit and I agree with the  Radiologist's read.   Interpretation by Radiologist:  DG Chest Portable 1 View  Result Date: 02/20/2020 CLINICAL DATA:  Chest pain and shortness of breath. Central pain with left arm radiation. Cough. EXAM: PORTABLE CHEST 1 VIEW COMPARISON:  Most recent radiograph 05/25/2020.  Chest CT 07/24/2019 FINDINGS: Low lung volumes limit assessment. Increasing ill-defined bibasilar opacities. Stable heart size and mediastinal contours. No pneumothorax or large pleural effusion. No evidence of pulmonary edema. Stable osseous structures. IMPRESSION: Low lung volumes with increasing ill-defined bibasilar opacities, may be atelectasis or pneumonia in the appropriate clinical setting. Electronically Signed   By: Keith Rake M.D.   On: 02/20/2020 21:31   CT ANGIO CHEST AORTA W/CM & OR WO/CM  Result Date: 02/21/2020 CLINICAL DATA:  Chest and back pain.  Concern for dissection. EXAM: CT ANGIOGRAPHY CHEST WITH CONTRAST TECHNIQUE: Multidetector CT imaging of the chest was performed using the standard protocol during bolus administration of intravenous contrast. Multiplanar CT image reconstructions and MIPs were obtained to evaluate the vascular anatomy. CONTRAST:  27m OMNIPAQUE IOHEXOL 350 MG/ML SOLN COMPARISON:  None. FINDINGS: C Cardiovascular: There is no evidence for thoracic aortic dissection. There is no evidence for thoracic aortic aneurysm. The heart size is normal. Atherosclerotic changes are noted of the thoracic aorta. There is no large centrally located pulmonary embolism. Detection of smaller pulmonary emboli is limited by contrast timing. Mediastinum/Nodes: -- No mediastinal lymphadenopathy. -- No hilar lymphadenopathy. -- No axillary lymphadenopathy. -- No supraclavicular lymphadenopathy. -- Normal thyroid gland where visualized. -  Unremarkable esophagus. Lungs/Pleura: Airways are patent. No pleural effusion, lobar consolidation, pneumothorax or pulmonary infarction. Upper Abdomen: There is cirrhosis  with stigmata of portal hypertension. Again noted is atrophy of the right kidney with associated hydronephrosis.  Musculoskeletal: No chest wall abnormality. No bony spinal canal stenosis. Review of the MIP images confirms the above findings. IMPRESSION: 1. No acute findings in the chest. 2. Cirrhosis with stigmata of portal hypertension. 3. Atrophic right kidney with associated hydronephrosis, better appreciated on the patient's recent CT abdomen pelvis. Aortic Atherosclerosis (ICD10-I70.0). Electronically Signed   By: Constance Holster M.D.   On: 02/21/2020 01:42      Rudene Re, MD 02/21/20 [phone]

## 2020-02-21 NOTE — ED Notes (Signed)
Helped pt to toilet. Swayed a little while walking.
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
## 2020-02-21 NOTE — ED Notes (Signed)
Husband called given this RN's phone

## 2020-03-03 ENCOUNTER — Ambulatory Visit: Payer: Medicare HMO | Admitting: Physician Assistant

## 2020-03-04 ENCOUNTER — Encounter: Payer: Self-pay | Admitting: Physician Assistant

## 2020-03-10 ENCOUNTER — Other Ambulatory Visit: Payer: Self-pay | Admitting: Pain Medicine

## 2020-03-10 DIAGNOSIS — G894 Chronic pain syndrome: Secondary | ICD-10-CM

## 2020-03-16 ENCOUNTER — Ambulatory Visit: Payer: Medicare Other | Attending: Critical Care Medicine

## 2020-03-16 ENCOUNTER — Ambulatory Visit: Payer: Medicare HMO

## 2020-03-16 DIAGNOSIS — Z23 Encounter for immunization: Secondary | ICD-10-CM

## 2020-03-16 NOTE — Progress Notes (Signed)
    Covid-19 Vaccination Clinic  Name:  KAESHA KIRSCH    MRN: [id number] DOB: 1956/08/06  03/16/2020  Ms. Deshpande was observed post Covid-19 immunization for 15 minutes without incident. She was provided with Vaccine Information Sheet and instruction to access the V-Safe system.   Ms. Steinhauser was instructed to call 911 with any severe reactions post vaccine: Marland Kitchen Difficulty breathing  . Swelling of face and throat  . A fast heartbeat  . A bad rash all over body  . Dizziness and weakness   Immunizations Administered    Name Date Dose VIS Date Route   Moderna COVID-19 Vaccine 03/16/2020 12:30 PM 0.5 mL 08/2019 Intramuscular   Manufacturer: Levan Hurst   Lot: [id number]   [place]: [phone]

## 2020-03-30 IMAGING — US US HEPATIC LIVER DOPPLER
1 series · 14 of 25 positions shown · non-contrast
Comparison: CT 08/06/2016 and previous

CLINICAL DATA: Cirrhosis

EXAM:
DUPLEX ULTRASOUND OF LIVER
TECHNIQUE: Color and duplex Doppler ultrasound was performed to evaluate the
hepatic in-flow and out-flow vessels.

[Series 1: us hepatic liver doppler · 0.26mm/px · 14 of 36 slices shown]
[im 1/36]
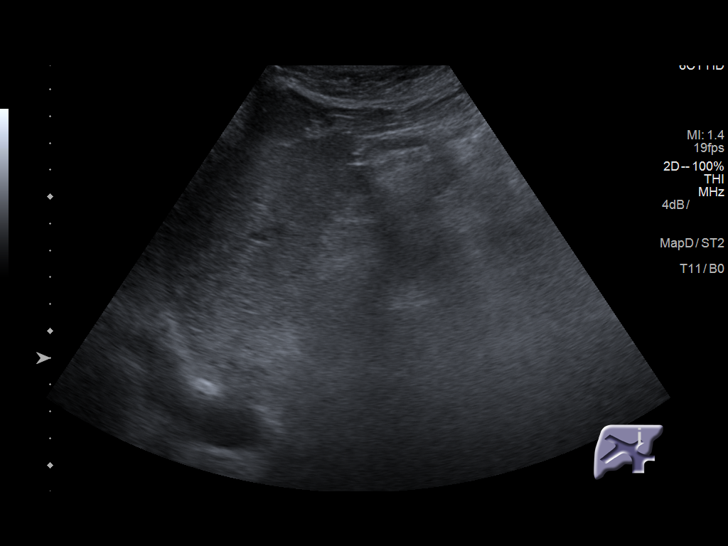
[im 3/36]
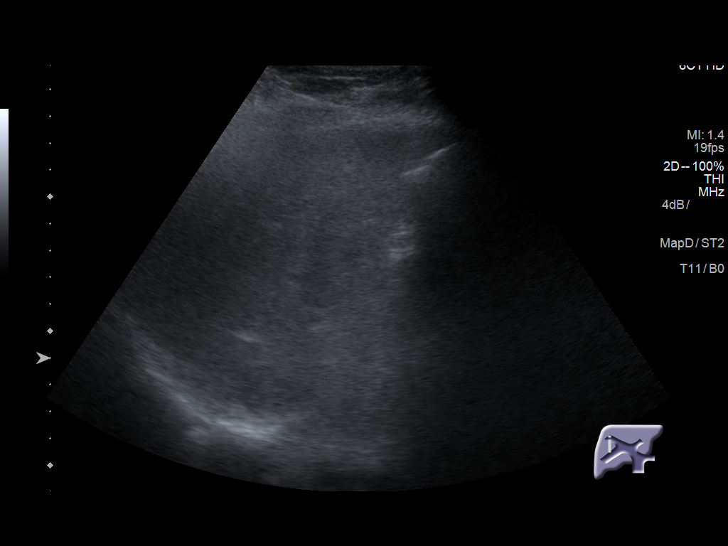
[im 6/36]
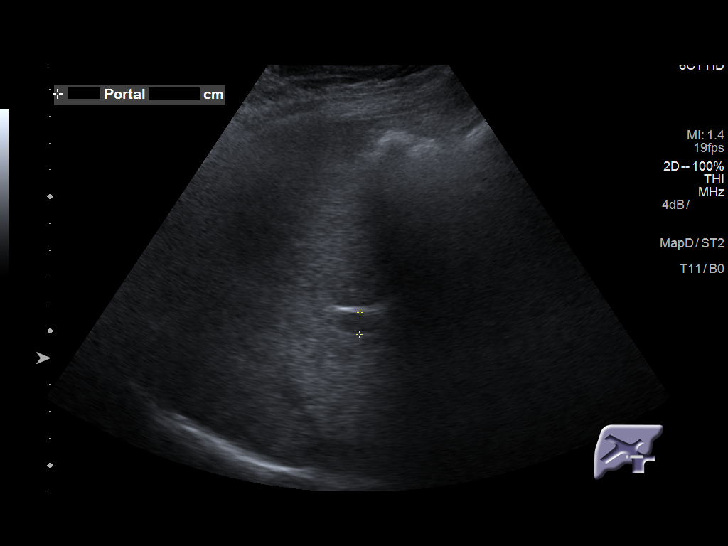
[im 9/36]
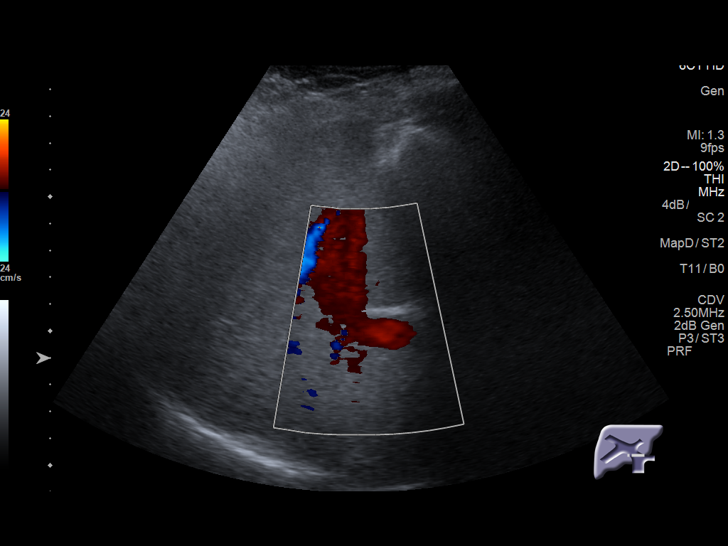
[im 12/36]
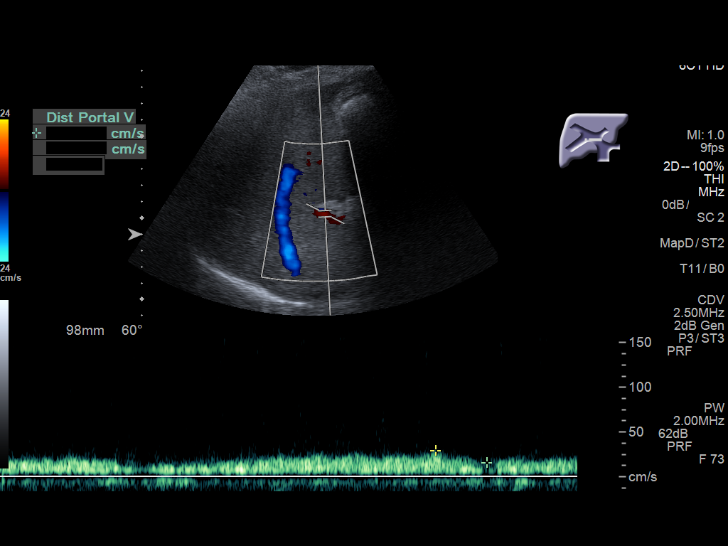
[im 14/36]
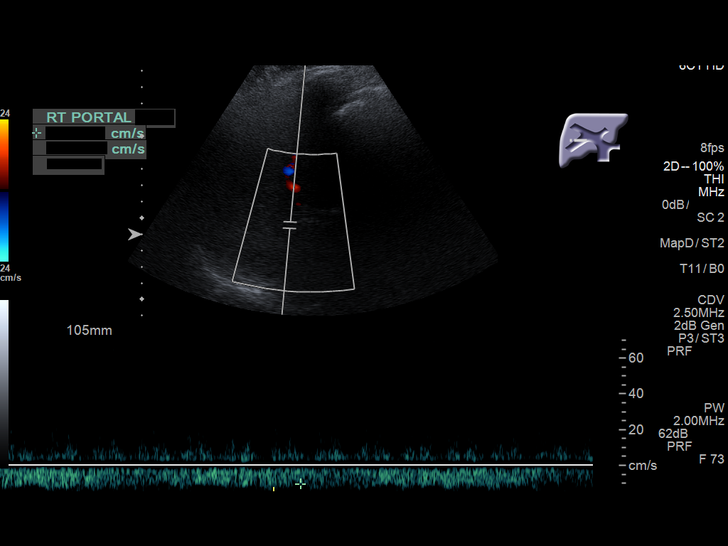
[im 17/36]
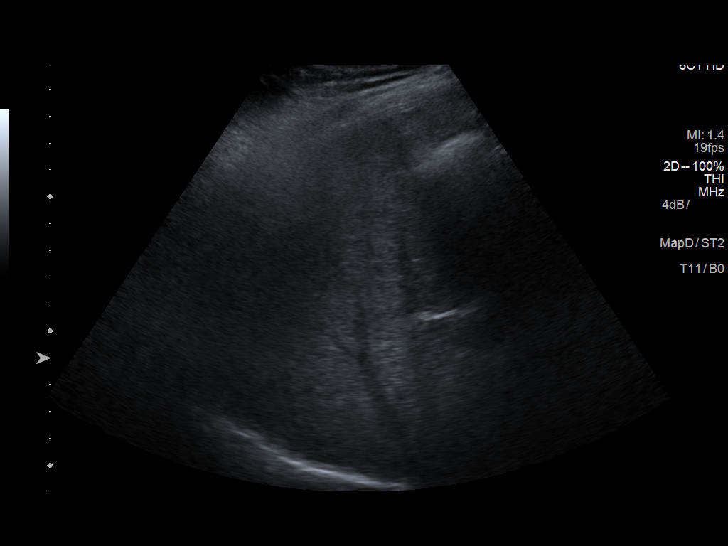
[im 19/36]
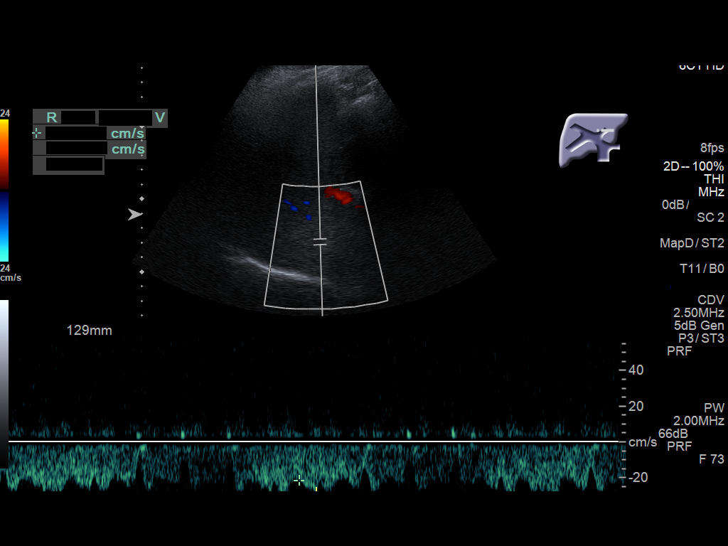
[im 22/36]
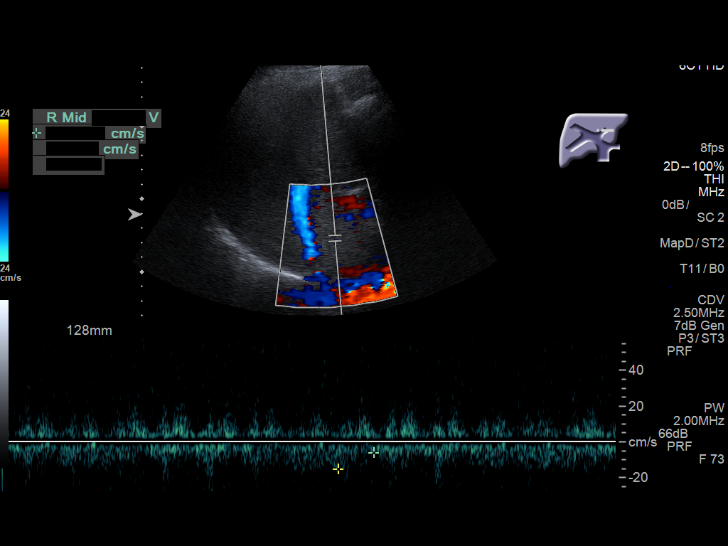
[im 24/36]
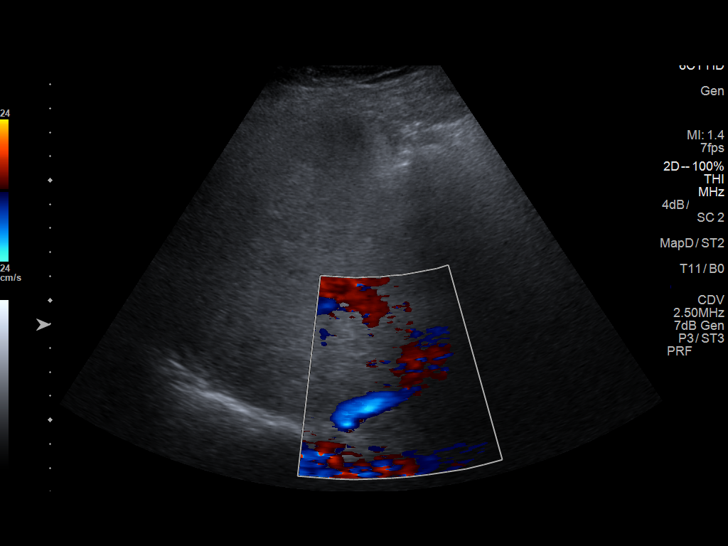
[im 27/36]
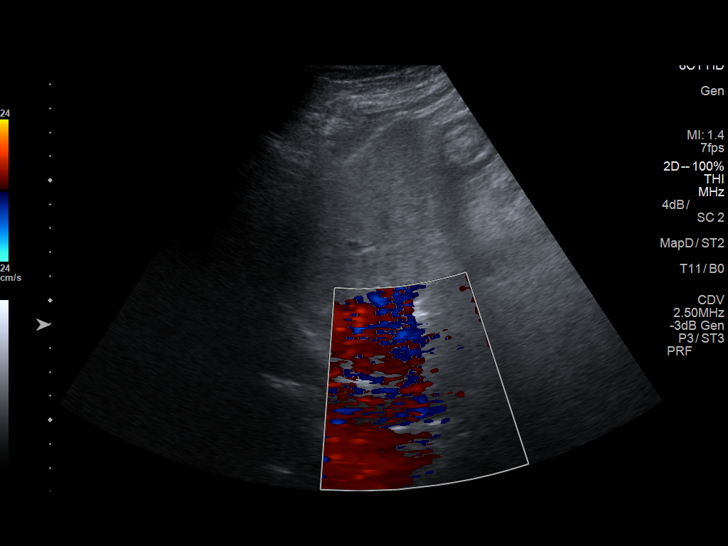
[im 30/36]
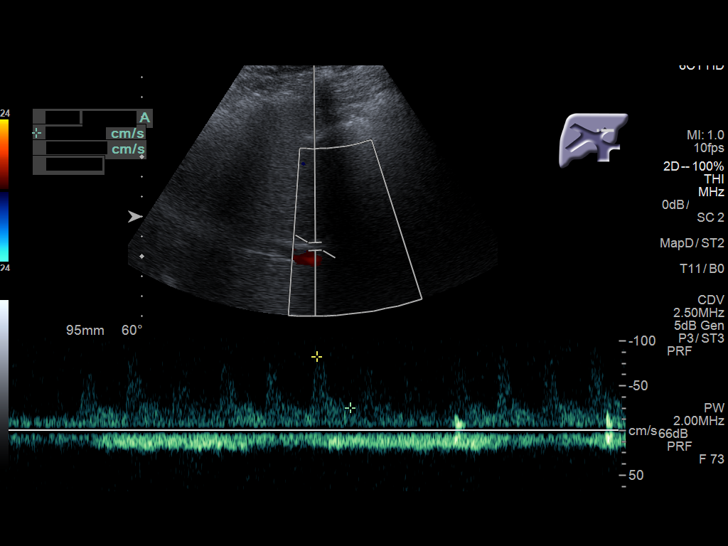
[im 33/36]
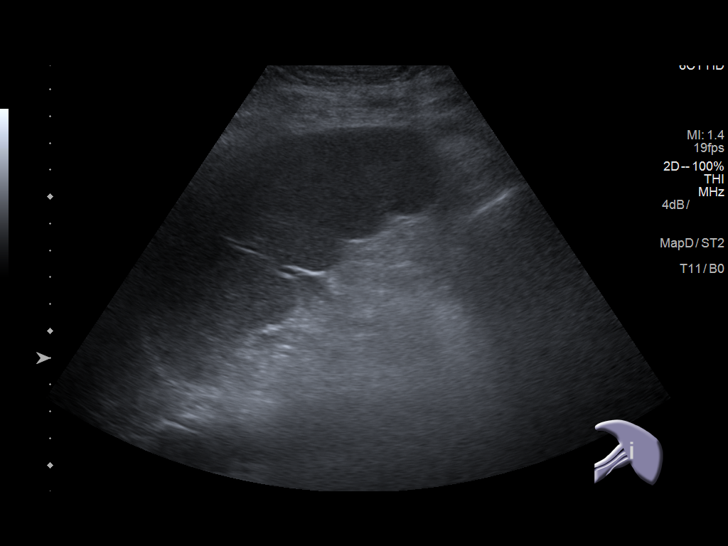
[im 36/36]
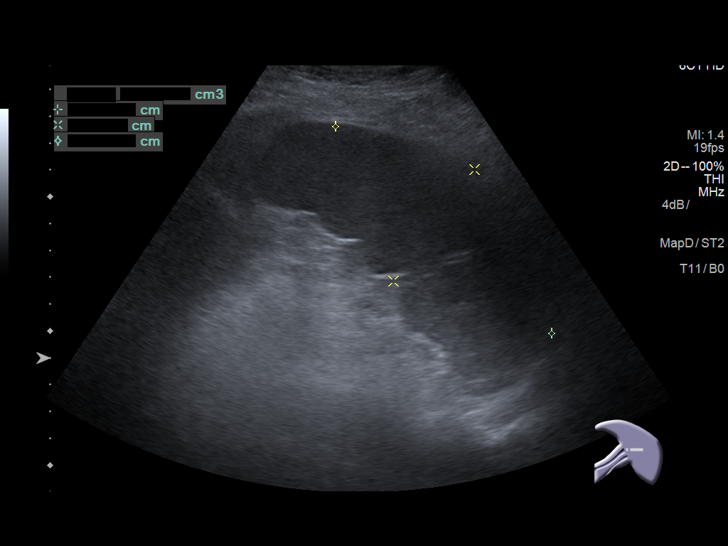

[14 of 25 positions shown; findings below may reference images not displayed]

FINDINGS: Portal Vein 0.8 cm diameter. No evidence of occlusion or thrombus.
Velocities (all hepatopetal):

Main:  21-30 cm/sec

Right:  15 cm/sec

Left:  19 cm/sec

Hepatic Vein Velocities (all hepatofugal):

Right:  28 cm/sec

Middle:  15 cm/sec

Left:  40 cm/sec

Intrahepatic IVC patent, 62 cm/second

Hepatic Artery Velocity:  83 cm/sec

Spleen 10.8 x 5.1 x 11.2 cm. (Volume = 320 cm^3). Splenic Vein
Velocity: 27 cm/sec. No evidence of occlusion or thrombus.

Varices: None identified

Ascites: None
IMPRESSION: 1. Normal hepatic vascular Doppler evaluation.

## 2020-03-31 IMAGING — US US CAROTID DUPLEX BILAT
1 series · 13 of 24 positions shown · non-contrast
Comparison: Brain MRI 12/30/2007

CLINICAL DATA: 62-year-old female with stroke-like symptoms

EXAM:
BILATERAL CAROTID DUPLEX ULTRASOUND
TECHNIQUE: Gray scale imaging, color Doppler and duplex ultrasound were
performed of bilateral carotid and vertebral arteries in the neck.

[Series 1: us carotid duplex bilat · 13 of 66 slices shown]
[im 1/66]
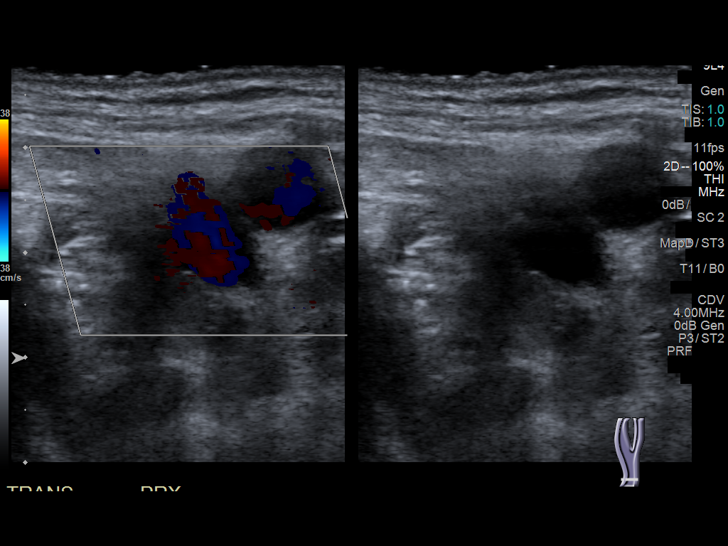
[im 6/66]
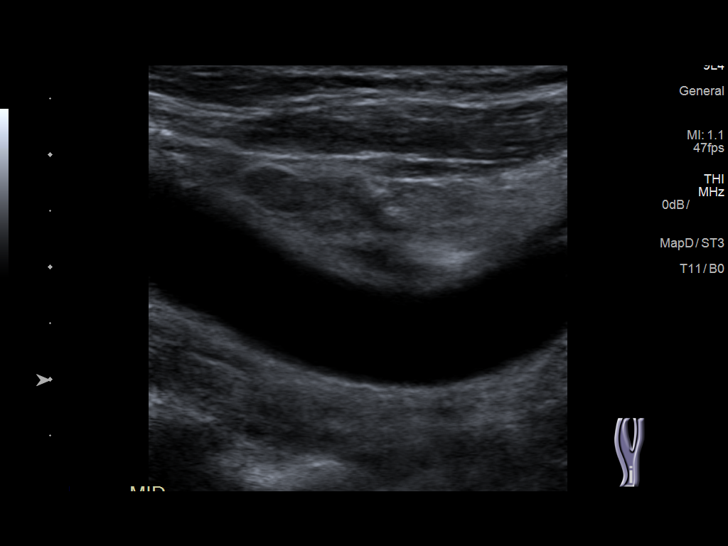
[im 12/66]
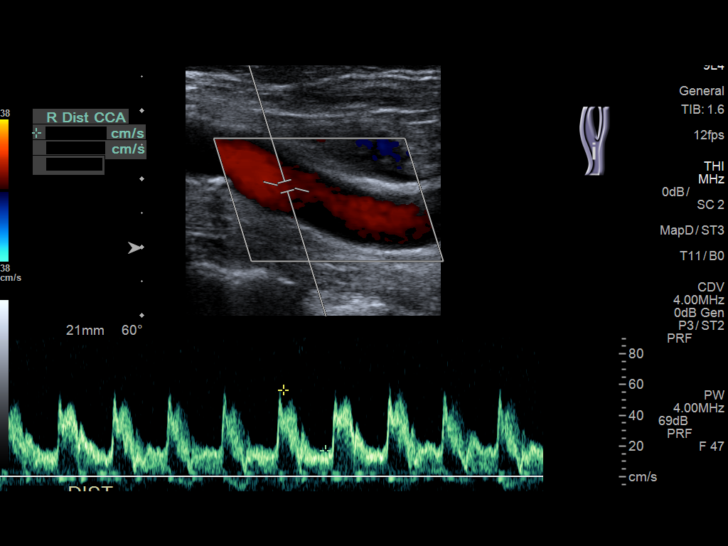
[im 17/66]
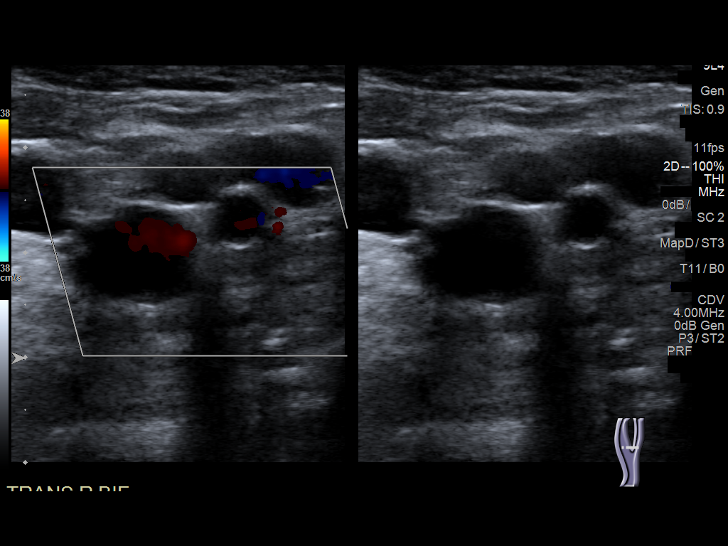
[im 23/66]
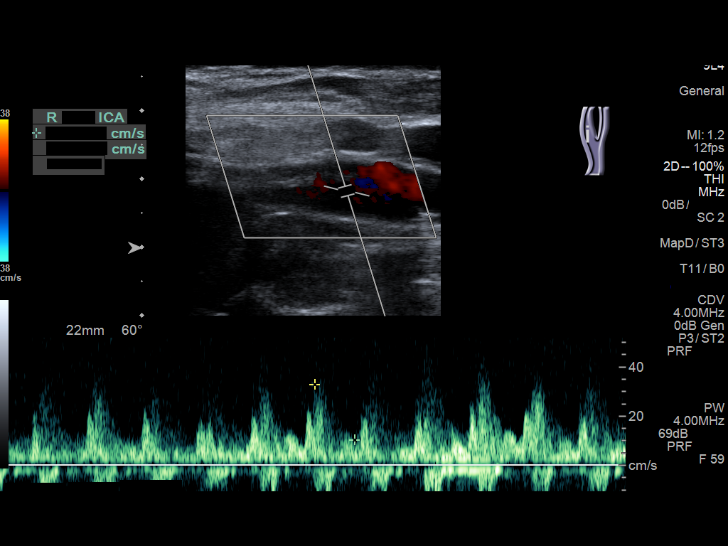
[im 29/66]
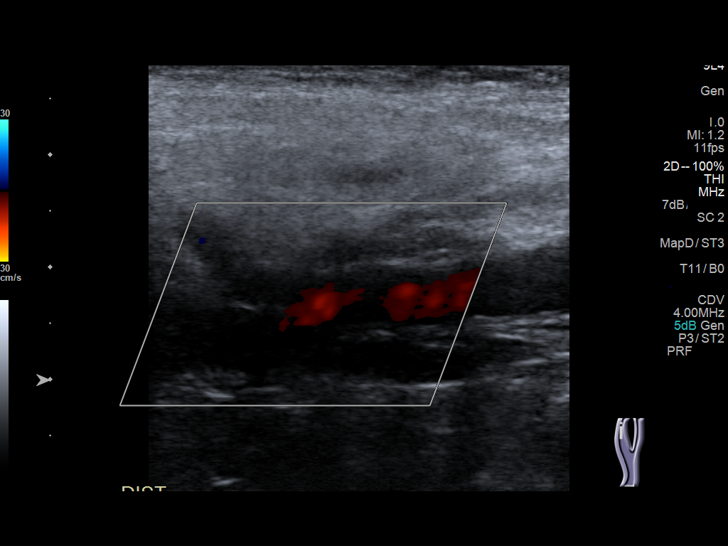
[im 34/66]
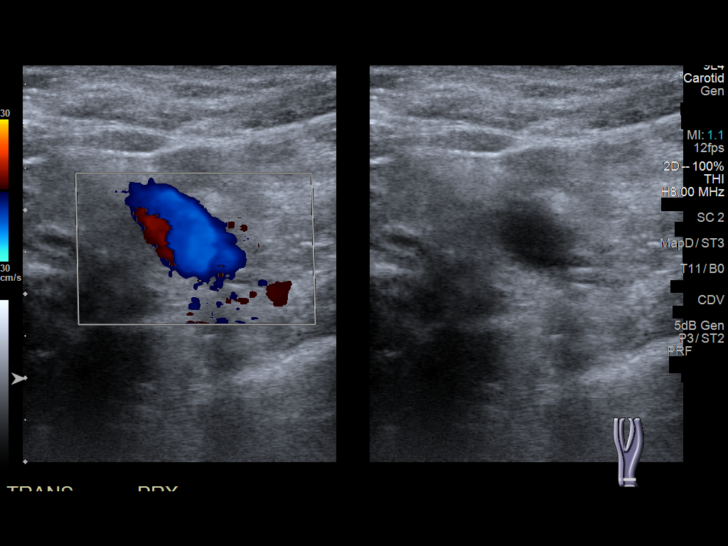
[im 37/66]
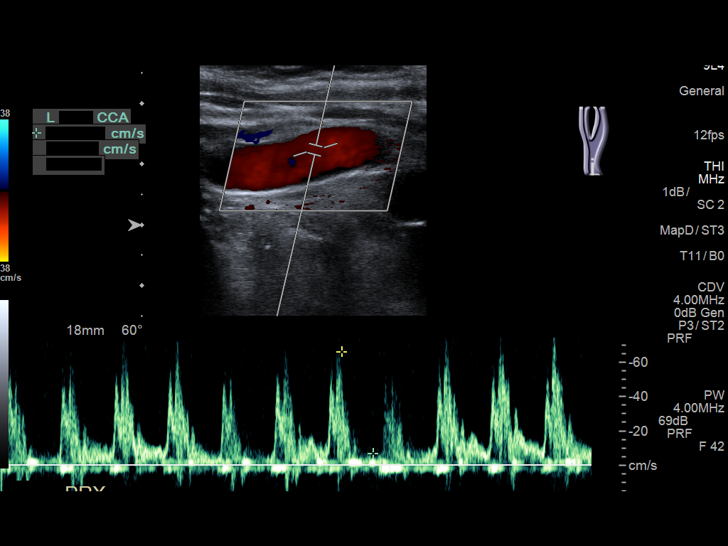
[im 43/66]
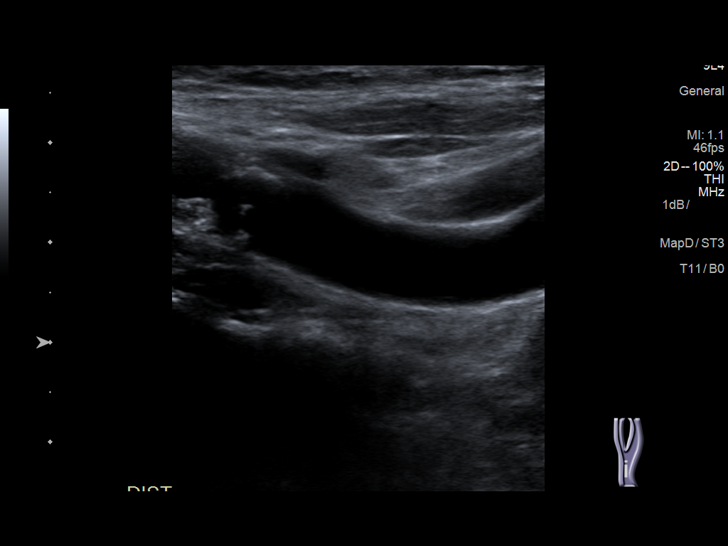
[im 49/66]
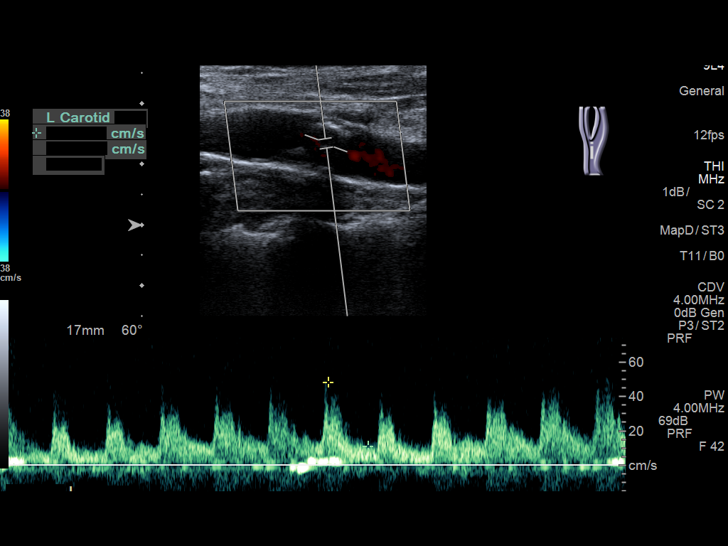
[im 54/66]
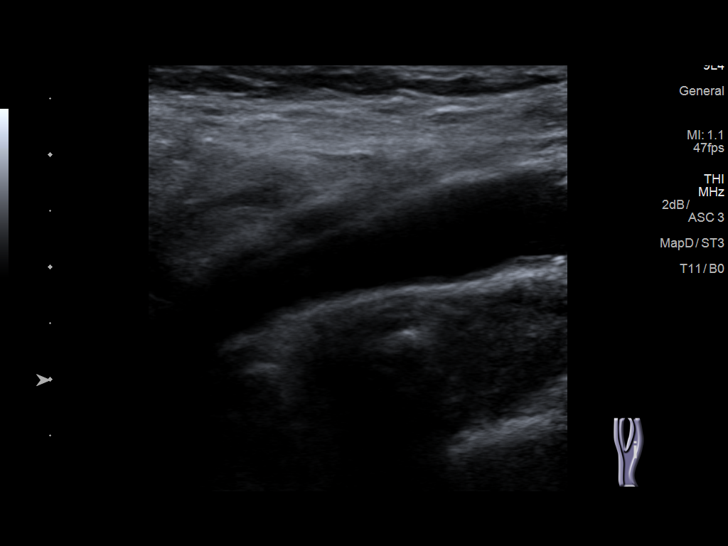
[im 60/66]
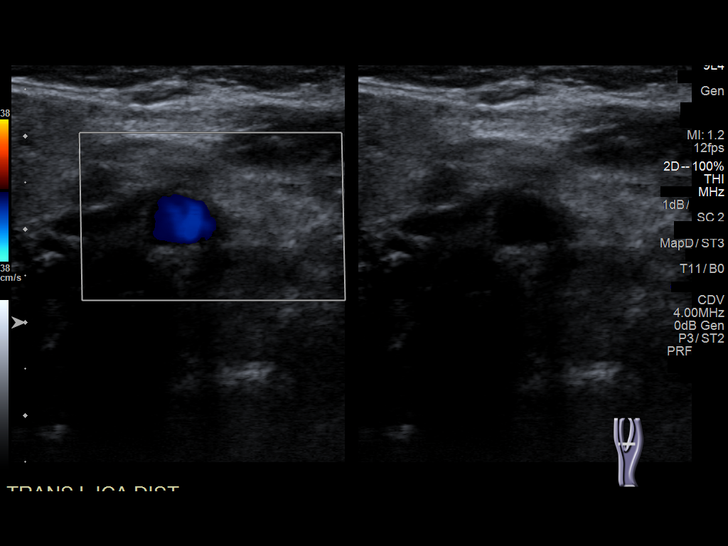
[im 66/66]
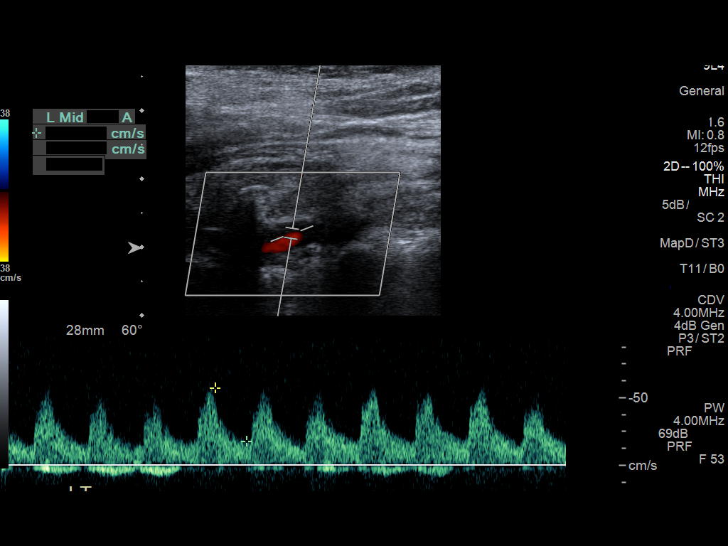

[13 of 24 positions shown; findings below may reference images not displayed]

FINDINGS: Criteria: Quantification of carotid stenosis is based on velocity
parameters that correlate the residual internal carotid diameter
with NASCET-based stenosis levels, using the diameter of the distal
internal carotid lumen as the denominator for stenosis measurement.

The following velocity measurements were obtained:

RIGHT
ICA: 49/21 cm/sec
CCA: 42/14 cm/sec

SYSTOLIC ICA/CCA RATIO:

ECA:  47 cm/sec

LEFT

ICA: 87/31 cm/sec

CCA: 35/11 cm/sec

SYSTOLIC ICA/CCA RATIO:

ECA:  54 cm/sec

RIGHT CAROTID ARTERY: Trace heterogeneous atherosclerotic plaque in
the distal common carotid artery. No significant plaque or evidence
of stenosis in the internal carotid artery.

RIGHT VERTEBRAL ARTERY:  Patent with normal antegrade flow.

LEFT CAROTID ARTERY: Mild heterogeneous atherosclerotic plaque in
the distal common carotid artery. No evidence of significant plaque
or stenosis in the internal carotid artery.

LEFT VERTEBRAL ARTERY:  Patent with normal antegrade flow.
IMPRESSION: 1. No evidence of significant atherosclerotic plaque or stenosis in
either internal carotid artery.
2. Mild heterogeneous atherosclerotic plaque in the distal bilateral
common carotid arteries.
3. Vertebral arteries are patent with normal antegrade flow.

## 2020-04-01 IMAGING — US US ABDOMEN LIMITED
1 series · 14 of 25 positions shown · non-contrast
Comparison: Normal liver Doppler study 12/28/2017.

CLINICAL DATA: Cirrhosis.  Previous cholecystectomy.

EXAM:
ULTRASOUND ABDOMEN LIMITED RIGHT UPPER QUADRANT

[Series 1: us abdomen limited · 0.24mm/px · 14 of 37 slices shown]
[im 1/37]
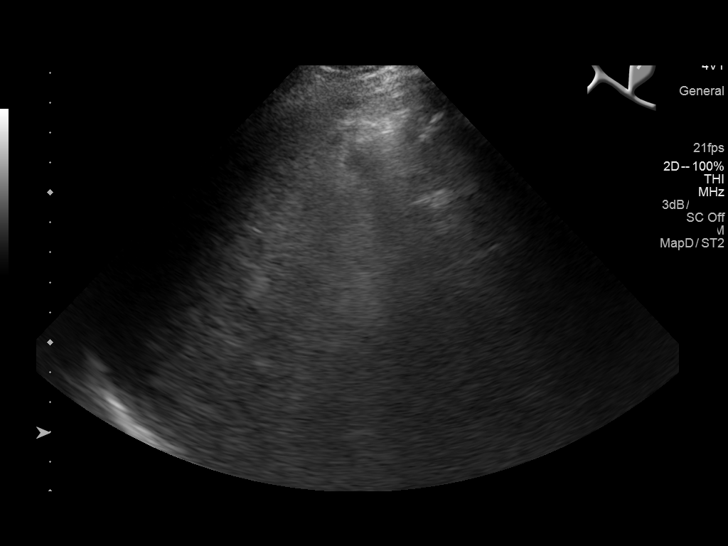
[im 4/37]
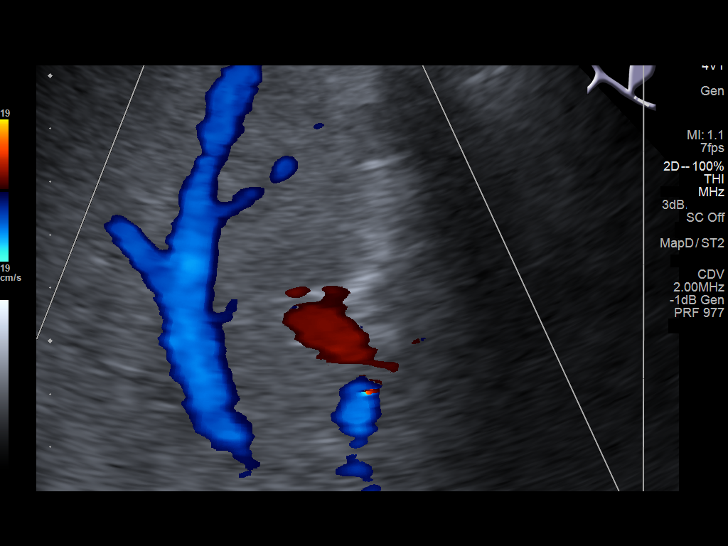
[im 7/37]
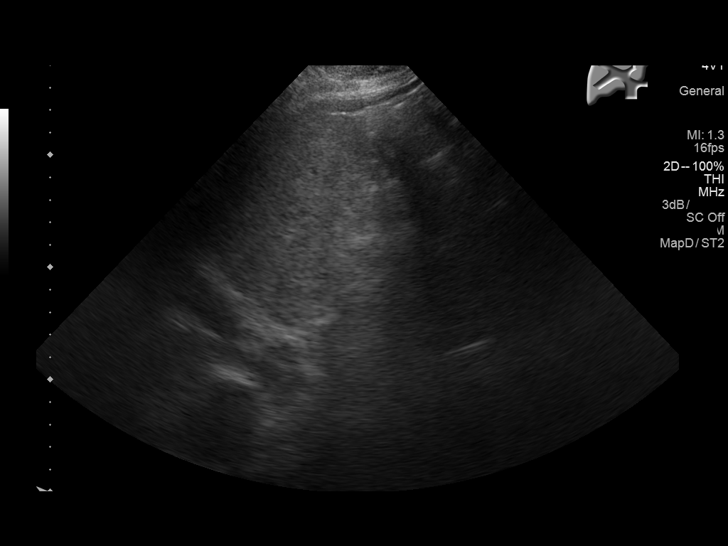
[im 10/37]
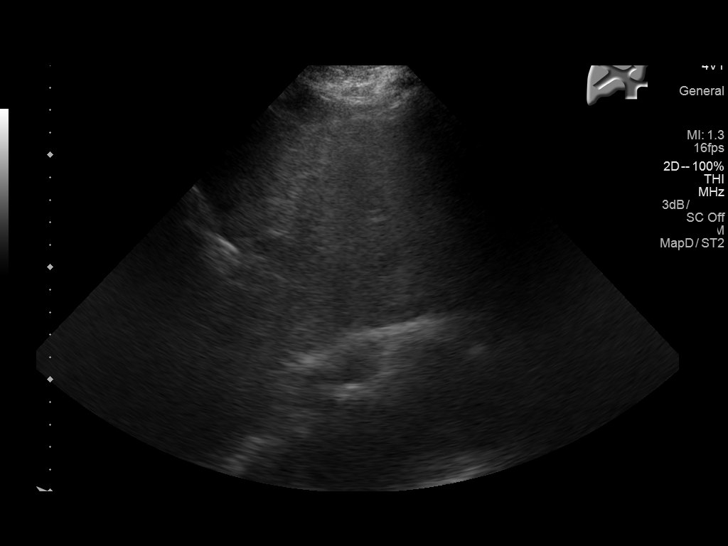
[im 13/37]
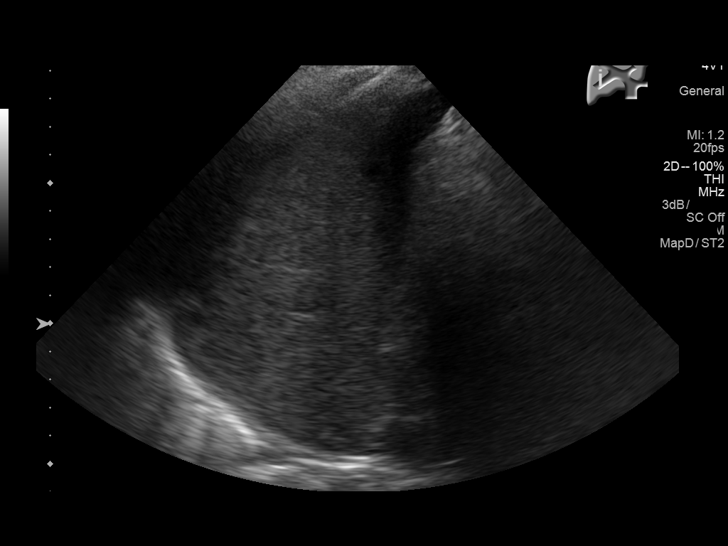
[im 14/37]
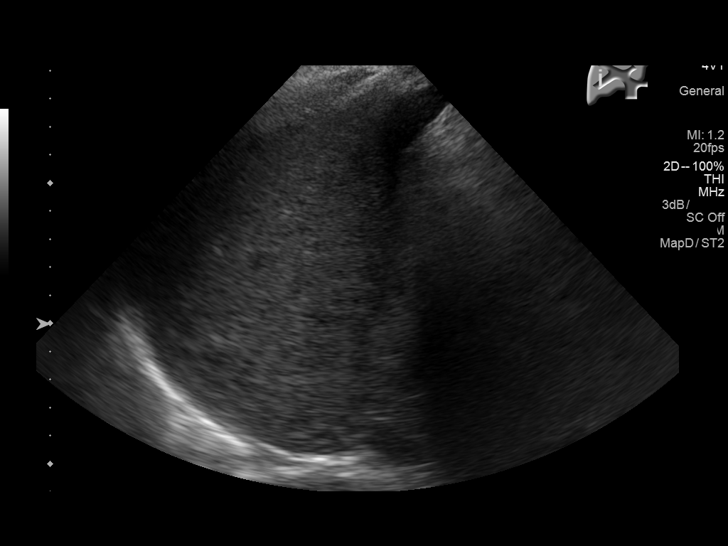
[im 17/37]
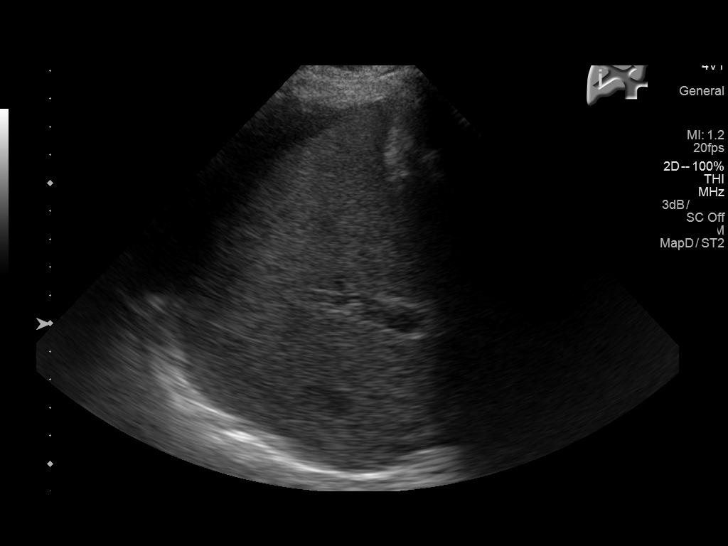
[im 20/37]
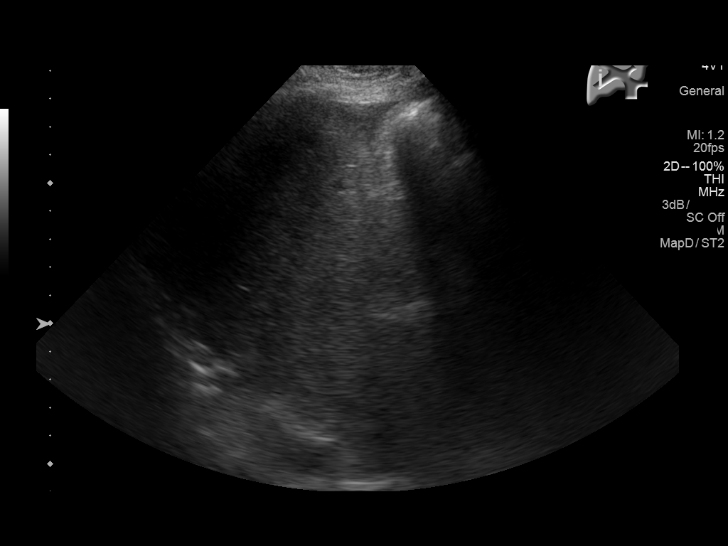
[im 23/37]
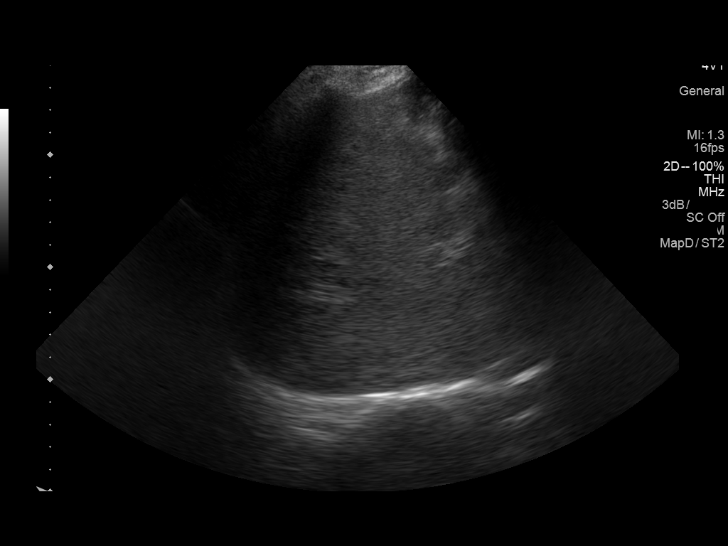
[im 25/37]
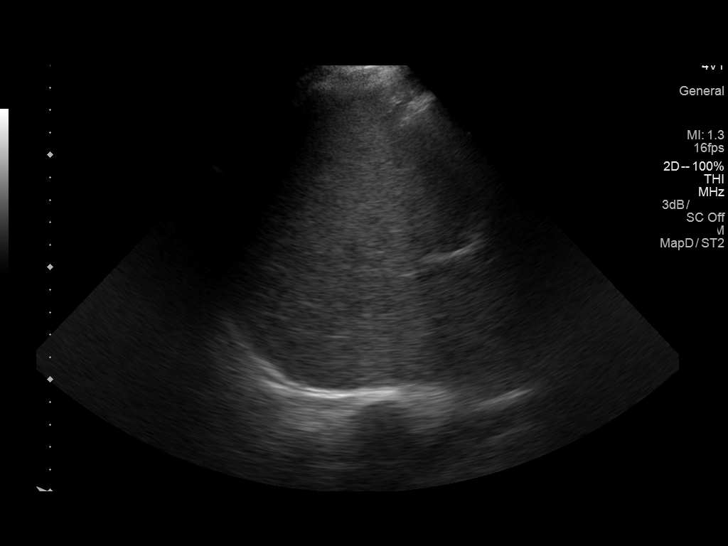
[im 28/37]
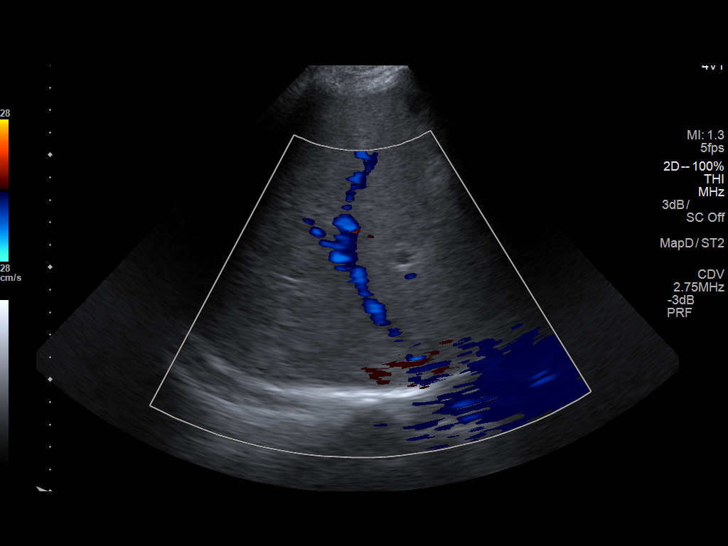
[im 31/37]
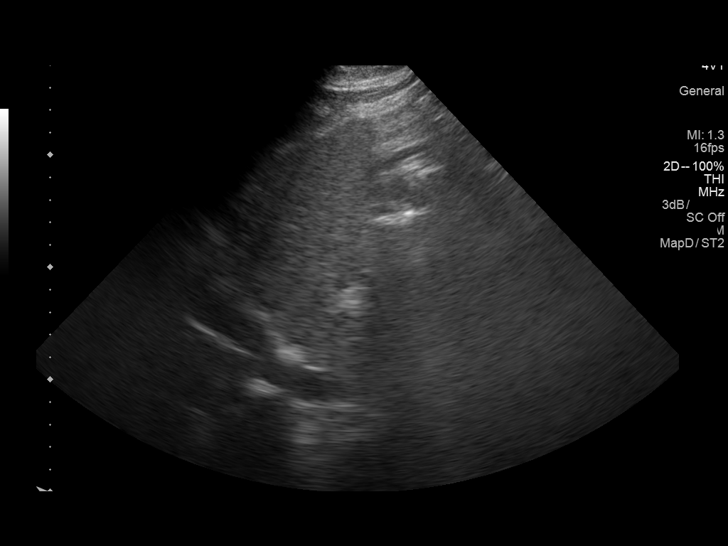
[im 34/37]
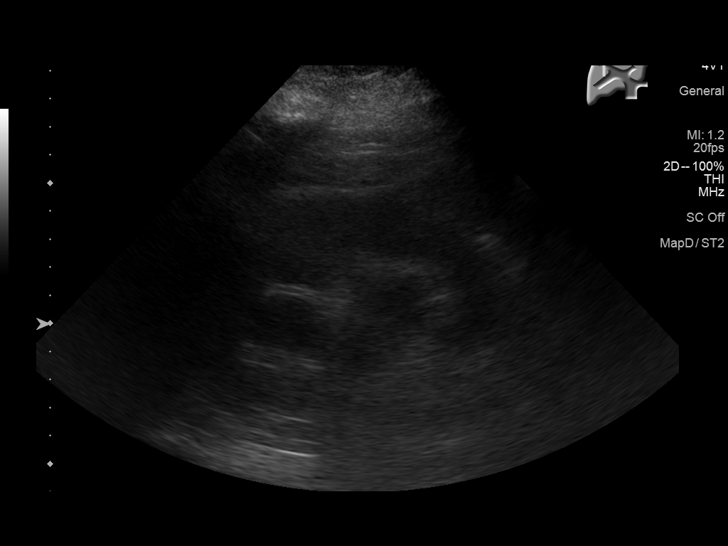
[im 37/37]
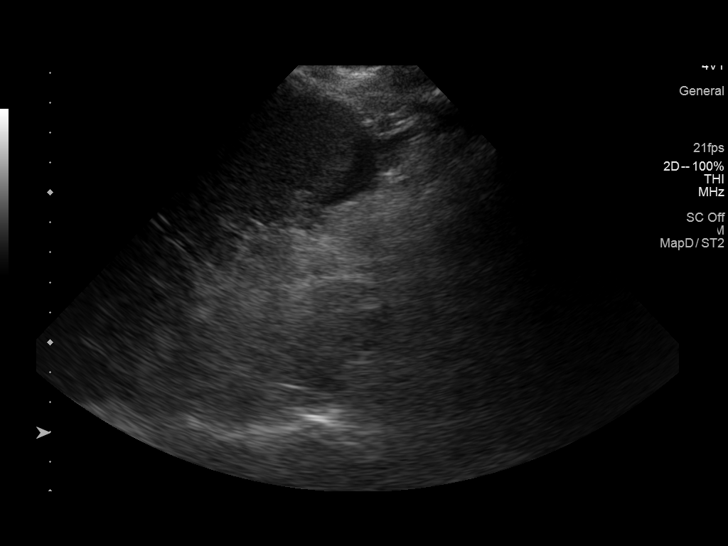

[14 of 25 positions shown; findings below may reference images not displayed]

FINDINGS: Gallbladder:

Surgically absent

Common bile duct:

Diameter: 3 mm, unremarkable

Liver:

Coarse echotexture, difficult to penetrate. No focal lesion
identified. Antegrade flow in the portal vein is again documented.
IMPRESSION: 1. Coarse hepatic echotexture without focal lesion or biliary ductal
dilatation.

## 2020-04-05 NOTE — Patient Instructions (Signed)
____________________________________________________________________________________________  Drug Holidays (Slow)  What is a "Drug Holiday"? Drug Holiday: is the name given to the period of time during which a patient stops taking a medication(s) for the purpose of eliminating tolerance to the drug.  Benefits . Improved effectiveness of opioids. . Decreased opioid dose needed to achieve benefits. . Improved pain with lesser dose.  What is tolerance? Tolerance: is the progressive decreased in effectiveness of a drug due to its repetitive use. With repetitive use, the body gets use to the medication and as a consequence, it loses its effectiveness. This is a common problem seen with opioid pain medications. As a result, a larger dose of the drug is needed to achieve the same effect that used to be obtained with a smaller dose.  How long should a "Drug Holiday" last? You should stay off of the pain medicine for at least 14 consecutive days. (2 weeks)  Should I stop the medicine "cold turkey"? No. You should always coordinate with your Pain Specialist so that he/she can provide you with the correct medication dose to make the transition as smoothly as possible.  How do I stop the medicine? Slowly. You will be instructed to decrease the daily amount of pills that you take by one (1) pill every seven (7) days. This is called a "slow downward taper" of your dose. For example: if you normally take four (4) pills per day, you will be asked to drop this dose to three (3) pills per day for seven (7) days, then to two (2) pills per day for seven (7) days, then to one (1) per day for seven (7) days, and at the end of those last seven (7) days, this is when the "Drug Holiday" would start.   Will I have withdrawals? By doing a "slow downward taper" like this one, it is unlikely that you will experience any significant withdrawal symptoms. Typically, what triggers withdrawals is the sudden stop of a high  dose opioid therapy. Withdrawals can usually be avoided by slowly decreasing the dose over a prolonged period of time. If you do not follow these instructions and decide to stop your medication abruptly, withdrawals may be possible.  What are withdrawals? Withdrawals: refers to the wide range of symptoms that occur after stopping or dramatically reducing opiate drugs after heavy and prolonged use. Withdrawal symptoms do not occur to patients that use low dose opioids, or those who take the medication sporadically. Contrary to benzodiazepine (example: Valium, Xanax, etc.) or alcohol withdrawals ("Delirium Tremens"), opioid withdrawals are not lethal. Withdrawals are the physical manifestation of the body getting rid of the excess receptors.  Expected Symptoms Early symptoms of withdrawal may include: . Agitation . Anxiety . Muscle aches . Increased tearing . Insomnia . Runny nose . Sweating . Yawning  Late symptoms of withdrawal may include: . Abdominal cramping . Diarrhea . Dilated pupils . Goose bumps . Nausea . Vomiting  Will I experience withdrawals? Due to the slow nature of the taper, it is very unlikely that you will experience any.  What is a slow taper? Taper: refers to the gradual decrease in dose.  (Last update: 03/30/2020) ____________________________________________________________________________________________    ____________________________________________________________________________________________  Medication Rules  Purpose: To inform patients, and their family members, of our rules and regulations.  Applies to: All patients receiving prescriptions (written or electronic).  Pharmacy of record: Pharmacy where electronic prescriptions will be sent. If written prescriptions are taken to a different pharmacy, please inform the nursing staff. The pharmacy   listed in the electronic medical record should be the one where you would like electronic prescriptions  to be sent.  Electronic prescriptions: In compliance with the [place] Strengthen Opioid Misuse Prevention (STOP) Act of 2017 (Session Law 2017-74/H243), effective September 10, 2018, all controlled substances must be electronically prescribed. Calling prescriptions to the pharmacy will cease to exist.  Prescription refills: Only during scheduled appointments. Applies to all prescriptions.  NOTE: The following applies primarily to controlled substances (Opioid* Pain Medications).   Type of encounter (visit): For patients receiving controlled substances, face-to-face visits are required. (Not an option or up to the patient.)  Patient's responsibilities: 1. Pain Pills: Bring all pain pills to every appointment (except for procedure appointments). 2. Pill Bottles: Bring pills in original pharmacy bottle. Always bring the newest bottle. Bring bottle, even if empty. 3. Medication refills: You are responsible for knowing and keeping track of what medications you take and those you need refilled. The day before your appointment: write a list of all prescriptions that need to be refilled. The day of the appointment: give the list to the admitting nurse. Prescriptions will be written only during appointments. No prescriptions will be written on procedure days. If you forget a medication: it will not be "Called in", "Faxed", or "electronically sent". You will need to get another appointment to get these prescribed. No early refills. Do not call asking to have your prescription filled early. 4. Prescription Accuracy: You are responsible for carefully inspecting your prescriptions before leaving our office. Have the discharge nurse carefully go over each prescription with you, before taking them home. Make sure that your name is accurately spelled, that your address is correct. Check the name and dose of your medication to make sure it is accurate. Check the number of pills, and the written instructions to  make sure they are clear and accurate. Make sure that you are given enough medication to last until your next medication refill appointment. 5. Taking Medication: Take medication as prescribed. When it comes to controlled substances, taking less pills or less frequently than prescribed is permitted and encouraged. Never take more pills than instructed. Never take medication more frequently than prescribed.  6. Inform other Doctors: Always inform, all of your healthcare providers, of all the medications you take. 7. Pain Medication from other Providers: You are not allowed to accept any additional pain medication from any other Doctor or Healthcare provider. There are two exceptions to this rule. (see below) In the event that you require additional pain medication, you are responsible for notifying us, as stated below. 8. Medication Agreement: You are responsible for carefully reading and following our Medication Agreement. This must be signed before receiving any prescriptions from our practice. Safely store a copy of your signed Agreement. Violations to the Agreement will result in no further prescriptions. (Additional copies of our Medication Agreement are available upon request.) 9. Laws, Rules, & Regulations: All patients are expected to follow all Federal and State Laws, Statutes, Rules, & Regulations. Ignorance of the Laws does not constitute a valid excuse.  10. Illegal drugs and Controlled Substances: The use of illegal substances (including, but not limited to marijuana and its derivatives) and/or the illegal use of any controlled substances is strictly prohibited. Violation of this rule may result in the immediate and permanent discontinuation of any and all prescriptions being written by our practice. The use of any illegal substances is prohibited. 11. Adopted CDC guidelines & recommendations: Target dosing levels will be at or   below 60 MME/day. Use of benzodiazepines** is not  recommended.  Exceptions: There are only two exceptions to the rule of not receiving pain medications from other Healthcare Providers. 1. Exception #1 (Emergencies): In the event of an emergency (i.e.: accident requiring emergency care), you are allowed to receive additional pain medication. However, you are responsible for: As soon as you are able, call our office [phone], at any time of the day or night, and leave a message stating your name, the date and nature of the emergency, and the name and dose of the medication prescribed. In the event that your call is answered by a member of our staff, make sure to document and save the date, time, and the name of the person that took your information.  2. Exception #2 (Planned Surgery): In the event that you are scheduled by another doctor or dentist to have any type of surgery or procedure, you are allowed (for a period no longer than 30 days), to receive additional pain medication, for the acute post-op pain. However, in this case, you are responsible for picking up a copy of our "Post-op Pain Management for Surgeons" handout, and giving it to your surgeon or dentist. This document is available at our office, and does not require an appointment to obtain it. Simply go to our office during business hours (Monday-Thursday from 8:00 AM to 4:00 PM) (Friday 8:00 AM to 12:00 Noon) or if you have a scheduled appointment with us, prior to your surgery, and ask for it by name. In addition, you will need to provide us with your name, name of your surgeon, type of surgery, and date of procedure or surgery.  *Opioid medications include: morphine, codeine, oxycodone, oxymorphone, hydrocodone, hydromorphone, meperidine, tramadol, tapentadol, buprenorphine, fentanyl, methadone. **Benzodiazepine medications include: diazepam (Valium), alprazolam (Xanax), clonazepam (Klonopine), lorazepam (Ativan), clorazepate (Tranxene), chlordiazepoxide (Librium), estazolam (Prosom),  oxazepam (Serax), temazepam (Restoril), triazolam (Halcion) (Last updated: 11/07/2017) ____________________________________________________________________________________________   ____________________________________________________________________________________________  Medication Recommendations and Reminders  Applies to: All patients receiving prescriptions (written and/or electronic).  Medication Rules & Regulations: These rules and regulations exist for your safety and that of others. They are not flexible and neither are we. Dismissing or ignoring them will be considered "non-compliance" with medication therapy, resulting in complete and irreversible termination of such therapy. (See document titled "Medication Rules" for more details.) In all conscience, because of safety reasons, we cannot continue providing a therapy where the patient does not follow instructions.  Pharmacy of record:   Definition: This is the pharmacy where your electronic prescriptions will be sent.   We do not endorse any particular pharmacy, however, we have experienced problems with [facility] not securing enough medication supply for the community.  We do not restrict you in your choice of pharmacy. However, once we write for your prescriptions, we will NOT be re-sending more prescriptions to fix restricted supply problems created by your pharmacy, or your insurance.   The pharmacy listed in the electronic medical record should be the one where you want electronic prescriptions to be sent.  If you choose to change pharmacy, simply notify our nursing staff.  Recommendations:  Keep all of your pain medications in a safe place, under lock and key, even if you live alone. We will NOT replace lost, stolen, or damaged medication.  After you fill your prescription, take 1 week's worth of pills and put them away in a safe place. You should keep a separate, properly labeled bottle for this purpose. The remainder    should be kept in the original bottle. Use this as your primary supply, until it runs out. Once it's gone, then you know that you have 1 week's worth of medicine, and it is time to come in for a prescription refill. If you do this correctly, it is unlikely that you will ever run out of medicine.  To make sure that the above recommendation works, it is very important that you make sure your medication refill appointments are scheduled at least 1 week before you run out of medicine. To do this in an effective manner, make sure that you do not leave the office without scheduling your next medication management appointment. Always ask the nursing staff to show you in your prescription , when your medication will be running out. Then arrange for the receptionist to get you a return appointment, at least 7 days before you run out of medicine. Do not wait until you have 1 or 2 pills left, to come in. This is very poor planning and does not take into consideration that we may need to cancel appointments due to bad weather, sickness, or emergencies affecting our staff.  DO NOT ACCEPT A "Partial Fill": If for any reason your pharmacy does not have enough pills/tablets to completely fill or refill your prescription, do not allow for a "partial fill". The law allows the pharmacy to complete that prescription within 72 hours, without requiring a new prescription. If they do not fill the rest of your prescription within those 72 hours, you will need a separate prescription to fill the remaining amount, which we will NOT provide. If the reason for the partial fill is your insurance, you will need to talk to the pharmacist about payment alternatives for the remaining tablets, but again, DO NOT ACCEPT A PARTIAL FILL, unless you can trust your pharmacist to obtain the remainder of the pills within 72 hours.  Prescription refills and/or changes in medication(s):   Prescription refills, and/or changes in dose or medication,  will be conducted only during scheduled medication management appointments. (Applies to both, written and electronic prescriptions.)  No refills on procedure days. No medication will be changed or started on procedure days. No changes, adjustments, and/or refills will be conducted on a procedure day. Doing so will interfere with the diagnostic portion of the procedure.  No phone refills. No medications will be "called into the pharmacy".  No Fax refills.  No weekend refills.  No Holliday refills.  No after hours refills.  Remember:  Business hours are:  Monday to Thursday 8:00 AM to 4:00 PM Provider's Schedule: Abygale Karpf, MD - Appointments are:  Medication management: Monday and Wednesday 8:00 AM to 4:00 PM Procedure day: Tuesday and Thursday 7:30 AM to 4:00 PM Bilal Lateef, MD - Appointments are:  Medication management: Tuesday and Thursday 8:00 AM to 4:00 PM Procedure day: Monday and Wednesday 7:30 AM to 4:00 PM (Last update: 03/30/2020) ____________________________________________________________________________________________   ____________________________________________________________________________________________  CANNABIDIOL (AKA: CBD Oil or Pills)  Applies to: All patients receiving prescriptions of controlled substances (written and/or electronic).  General Information: Cannabidiol (CBD), a derivative of Marijuana, was discovered in 1940. It is one of some 113 identified cannabinoids in cannabis (Marijuana) plants, accounting for up to 40% of the plant's extract. As of 2018, preliminary clinical research on cannabidiol included studies of anxiety, cognition, movement disorders, and pain.  Cannabidiol is consummed in multiple ways, including inhalation of cannabis smoke or vapor, as an aerosol spray into the cheek, and by mouth. It   may be supplied as CBD oil containing CBD as the active ingredient (no added tetrahydrocannabinol (THC) or terpenes), a full-plant  CBD-dominant hemp extract oil, capsules, dried cannabis, or as a liquid solution. CBD is thought not have the same psychoactivity as THC, and may affect the actions of THC. Studies suggest that CBD may interact with different biological targets, including cannabinoid receptors and other neurotransmitter receptors. As of 2018 the mechanism of action for its biological effects has not been determined.  In the [place], cannabidiol has a limited approval by the Food and Drug Administration (FDA) for treatment of only two types of epilepsy disorders. The side effects of long-term use of the drug include somnolence, decreased appetite, diarrhea, fatigue, malaise, weakness, sleeping problems, and others.  CBD remains a Schedule I drug prohibited for any use.  Legality: Some manufacturers ship CBD products nationally, an illegal action which the FDA has not enforced in 2018, with CBD remaining the subject of an FDA investigational new drug evaluation, and is not considered legal as a dietary supplement or food ingredient as of December 2018. Federal illegality has made it difficult historically to conduct research on CBD. CBD is openly sold in head shops and health food stores in some states where such sales have not been explicitly legalized.  Warning: Because it is not FDA approved for general use or treatment of pain, it is not required to undergo the same manufacturing controls as prescription drugs.  This means that the available cannabidiol (CBD) may be contaminated with THC.  If this is the case, it will trigger a positive urine drug screen (UDS) test for cannabinoids (Marijuana).  Because a positive UDS for illicit substances is a violation of our medication agreement, your opioid analgesics (pain medicine) may be permanently discontinued. (Last update: 03/30/2020) ____________________________________________________________________________________________    

## 2020-04-05 NOTE — Progress Notes (Signed)
PROVIDER NOTE: Information contained herein reflects review and annotations entered in association with encounter. Interpretation of such information and data should be left to medically-trained personnel. Information provided to patient can be located elsewhere in the medical record under "Patient Instructions". Document created using STT-dictation technology, any transcriptional errors that may result from process are unintentional.    Patient: [name]  Service Category: E/M  Provider: Gaspar Cola, MD  DOB: 01/22/1956  DOS: 04/06/2020  Specialty: Interventional Pain Management  MRN: [id number]  Setting: Ambulatory outpatient  PCP: [name], NP  Type: Established Patient    Referring Provider: [name], NP  Location: Office  Delivery: Face-to-face     HPI  Reason for encounter: [name], a 64 y.o. year old female, is here today for evaluation and management of her Chronic pain syndrome [G89.4]. Ms. Gunn primary complain today is Back Pain (lower) Last encounter: Practice (03/10/2020). My last encounter with her was on 03/10/2020. Pertinent problems: Ms. Saia has Chronic pain syndrome; Myofascial pain syndrome; Chronic intermittent abdominal pain; History of Clostridium difficile; Chronic low back pain (Primary area of Pain) (Bilateral) (L>R) w/ sciatica (Bilateral); Chronic lower extremity pain (Secondary Area of Pain) (Bilateral) (L>R); Chronic neck pain; Intractable cyclical vomiting with nausea; DDD (degenerative disc disease), lumbar; Lumbar intervertebral disc displacement; Chronic musculoskeletal pain; Chronic generalized pain; Fibromyalgia syndrome; Lower extremity edema; Lumbar facet syndrome (Bilateral) (R>L); Closed fracture of left distal fibula; Acute medial meniscus tear of left knee; Closed fracture of lateral malleolus; Weakness of both lower extremities; and Acute pain of left foot on their pertinent problem list. Pain Assessment: Severity of  Chronic pain is reported as a 7 /10. Location: Back Lower, Left, Right/both legs down to knees. Onset: More than a month ago. Quality: Aching, Throbbing. Timing: Constant. Modifying factor(s): medication, heating pad. Vitals:  height is 5' 3"  (1.6 m) and weight is 165 lb (74.8 kg). Her temporal temperature is 97.2 F (36.2 C) (abnormal). Her blood pressure is 126/62 (abnormal) and her pulse is 122 (abnormal). Her respiration is 17 and oxygen saturation is 100%.   This patient was initially seen at our practice on 10/23/2015 by Dr. Beryle Flock.  She then came back for an initial evaluation on 11/25/2018 with Dionisio David, PA.  Right at that point we had the Covid pandemic and from that point on all of her all her visits were virtual and this is the very first visit where I get to actually meet the patient.  Her last urine drug screen test (02/04/2020) did not have any medication on it.  Today I questioned the patient about this and she indicated that it was due to her being off of the medicine, apparently on my request, for the purpose of doing a drug holiday.  However, according to the patient PMP on 01/09/2020 she filled one of my prescriptions for 180 tablets, which should have lasted until 02/08/2020.  In fact on 02/08/2020 she had the next prescription filled.  This means that she was not really telling me the truth.  She did not go through any type of drug holidays during that time.  Since there was no oxycodone on her UDS, today we have ordered a repeat test.  Interestingly, she indicates that there will be no medication in it since she has been throwing up for the past 3 days.  Initially she said that she threw up her medication today, but now she is clarifying that he has been for  the past 3 days.  Interestingly, she is not having any withdrawal symptoms.  Furthermore, she asked me if she would be able to get her medicine today.  There is a question that I usually get whenever the patient does have taken  more medication than prescribed and have ran out of medicine.  It is also interesting to note is that she did not bring her medications today for a pill count.  This again is increase my level suspicion regarding to the possibility that she may not be compliant with regards to how she takes the medication.  This events are racing my suspicion that there may be some misuse of the medication.    Even though today I had initially written for 3 different prescriptions, after having gotten full picture of the situation, I have decided to call the pharmacy and cancel 2 of those.  I will be seeing the patient back in about 30 days from now to reevaluate her case.  In addition, today I have reminded the patient that we do not take patients for medication management only and therefore today I will be reviewing her medical records so that we can get her interventional therapy started.  She understood and agreed with it.  Today I took quite a bit of time explaining to the patient about the clinic as well as the medication rules.  We had the patient complete a medication agreement and we have reminded the patient that our goal is to minimize the use of the opioids, and if at all possible stop them.  She has been made aware of the fact that we do not take patients for medication management only and that the only reason we had not started the interventional therapies was secondary to the Covid pandemic.  Now that this is changing, we will go back to looking at interventional therapies for the purpose of improving her pain.  Pharmacotherapy Assessment   Analgesic: Oxycodone IR 5 mg, 1 tab PO q 4 hrs (30 mg/day of oxycodone). Possibly High Risk. (See 04/06/20 note) MAX. MME: 200 mg/day MME/day: 45 mg/day.   Monitoring: [name] PMP: PDMP reviewed during this encounter.       Pharmacotherapy: No side-effects or adverse reactions reported. Compliance: No problems identified. Effectiveness: Clinically acceptable.  Chauncey Fischer, RN  04/06/2020 12:23 PM  Sign when Signing Visit On discharge, Pt was informed that she needed another urine specimen because no medications showed up in her urine last time. Pt stated the she has been throwing up in the last three days so she know that nothing will show up this time too. A urine specimen was obtained and sent to lab. A medication agreement was signed and she was given a copy of the agreement. She was also informed to bring her medication bottle with her on the next appointment even if the bottle is empty. The pharmacy was called and prescriptions for oxycodone was cancel for 05/08/20 and 06/07/20.   Chauncey Fischer, RN  04/06/2020 11:12 AM  Sign when Signing Visit Nursing Pain Medication Assessment:  Safety precautions to be maintained throughout the outpatient stay will include: orient to surroundings, keep bed in low position, maintain call bell within reach at all times, provide assistance with transfer out of bed and ambulation.  Medication Inspection Compliance: Ms. Devito did not comply with our request to bring her pills to be counted. She was reminded that bringing the medication bottles, even when empty, is a requirement.  Medication: None  brought in. Pill/Patch Count: None available to be counted. Bottle Appearance: No container available. Did not bring bottle(s) to appointment. Filled Date: N/A Last Medication intake:  YesterdaySafety precautions to be maintained throughout the outpatient stay will include: orient to surroundings, keep bed in low position, maintain call bell within reach at all times, provide assistance with transfer out of bed and ambulation.     UDS:  Summary  Date Value Ref Range Status  02/04/2020 Note  Final    Comment:    ==================================================================== ToxASSU[employer] Select 13 (MW) ==================================================================== Test                             Result       Flag        Units Drug Absent but Declared for Prescription Verification   Oxycodone                      Not Detected UNEXPECTED ng/mg creat ==================================================================== Test                      Result    Flag   Units      Ref Range   Creatinine              72               mg/dL      >=20 ==================================================================== Declared Medications:  The flagging and interpretation on this report are based on the  following declared medications.  Unexpected results may arise from  inaccuracies in the declared medications.  **Note: The testing scope of this panel includes these medications:  Oxycodone  **Note: The testing scope of this panel does not include the  following reported medications:  Albuterol (Ventolin HFA)  Atorvastatin (Lipitor)  Cephalexin (Keflex)  Cyclobenzaprine (Flexeril)  Insulin (Levemir)  Magnesium  Nitroglycerin (Nitrostat)  Sertraline  Vitamin D3 ==================================================================== For clinical consultation, please call [phone]. ====================================================================      ROS  Constitutional: Denies any fever or chills Gastrointestinal: No reported hemesis, hematochezia, vomiting, or acute GI distress Musculoskeletal: Denies any acute onset joint swelling, redness, loss of ROM, or weakness Neurological: No reported episodes of acute onset apraxia, aphasia, dysarthria, agnosia, amnesia, paralysis, loss of coordination, or loss of consciousness  Medication Review  albuterol, atorvastatin, cephALEXin, cholecalciferol, cyclobenzaprine, glucose blood, insulin detemir, magnesium gluconate, nitroGLYCERIN, oxyCODONE, and sertraline  History Review  Allergy: Ms. Gravett is allergic to aspirin, codeine sulfate, erythromycin, prednisone, rosiglitazone maleate, and tetanus-diphtheria toxoids td. Drug: Ms. Brutus  reports no history of  drug use. Alcohol:  reports no history of alcohol use. Tobacco:  reports that she has quit smoking. Her smoking use included cigarettes. She has never used smokeless tobacco. Social: Ms. Kettering  reports that she has quit smoking. Her smoking use included cigarettes. She has never used smokeless tobacco. She reports that she does not drink alcohol and does not use drugs. Medical:  has a past medical history of Acute encephalopathy (05/22/2018), Allergy, Anginal pain ([place]), Anxiety, Ascites, C. difficile colitis (07/10/2015), Cancer ([place]), Cirrhosis of liver not due to alcohol ([place]) (2016), Degenerative disk disease, Diverticulitis, Gastroparesis, GERD (gastroesophageal reflux disease), History of hiatal hernia, Hypertension, Hypothyroid, Hypothyroidism due to amiodarone, Ileus ([place]) (08/01/2015), Intussusception intestine ([place]) (05/2015), Orthostatic hypotension, PAF (paroxysmal atrial fibrillation) ([place]) (03/2015), Pancreatitis, Pneumonia (11/14/2015), Right ureteral stone (07/14/2016), Sepsis ([place]) (12/15/2017), Sick sinus syndrome ([place]), Stomach ulcer, Stroke [facility]), Syncope (01/2015),  Syncope due to orthostatic hypotension (05/18/2015), Tachyarrhythmia (01/10/2016), TIA (transient ischemic attack) (02/2015), Type 1 diabetes ([place]), UTERINE CANCER, HX OF (03/27/2007), and UTI (urinary tract infection) (05/22/2018). Surgical: Ms. Dufford  has a past surgical history that includes Hernia repair; Abdominal hysterectomy; Cholecystectomy; Esophagogastroduodenoscopy (N/A, 04/04/2015); Cardiac catheterization (N/A, 01/12/2016); Esophagogastroduodenoscopy (egd) with propofol (N/A, 01/18/2016); Flexible sigmoidoscopy (N/A, 01/18/2016); Cystoscopy/ureteroscopy/holmium laser (Right, 07/14/2016); Esophagogastroduodenoscopy (N/A, 12/28/2017); Cystoscopy with biopsy (N/A, 12/11/2019); Cystoscopy with fulgeration (N/A, 12/11/2019); Cystoscopy w/ retrogrades (Bilateral, 12/11/2019); Ureteroscopy (Right, 12/11/2019); Cystoscopy with stent placement (Right,  12/11/2019); and Cystoscopy with urethral dilatation (Right, 12/11/2019). Family: family history includes CAD in her brother and sister; Heart attack in her sister; Hypertension in her mother.  Laboratory Chemistry Profile   Renal Lab Results  Component Value Date   BUN 11 02/20/2020   CREATININE 1.31 (H) 02/20/2020   GFR 42.53 (L) 08/18/2019   GFRAA 49 (L) 02/20/2020   GFRNONAA 42 (L) 02/20/2020     Hepatic Lab Results  Component Value Date   AST 24 02/20/2020   ALT 21 02/20/2020   ALBUMIN 3.3 (L) 02/20/2020   ALKPHOS 111 02/20/2020   HCVAB 0.2 09/25/2018   AMYLASE 28 08/01/2015   LIPASE 20 12/05/2019   AMMONIA 46 (H) 08/13/2019     Electrolytes Lab Results  Component Value Date   NA 135 02/20/2020   K 3.8 02/20/2020   CL 102 02/20/2020   CALCIUM 8.1 (L) 02/20/2020   MG 2.4 11/27/2019   PHOS 4.6 07/14/2019     Bone Lab Results  Component Value Date   VD25OH 4.7 (L) 11/25/2018     Inflammation (CRP: Acute Phase) (ESR: Chronic Phase) Lab Results  Component Value Date   CRP 10.8 (H) 11/25/2018   ESRSEDRATE 79 (H) 11/25/2018   LATICACIDVEN 1.2 01/23/2020       Note: Above Lab results reviewed.  Recent Imaging Review  CT ANGIO CHEST AORTA W/CM & OR WO/CM CLINICAL DATA:  Chest and back pain.  Concern for dissection.  EXAM: CT ANGIOGRAPHY CHEST WITH CONTRAST  TECHNIQUE: Multidetector CT imaging of the chest was performed using the standard protocol during bolus administration of intravenous contrast. Multiplanar CT image reconstructions and MIPs were obtained to evaluate the vascular anatomy.  CONTRAST:  38m OMNIPAQUE IOHEXOL 350 MG/ML SOLN  COMPARISON:  None.  FINDINGS: C  Cardiovascular: There is no evidence for thoracic aortic dissection. There is no evidence for thoracic aortic aneurysm. The heart size is normal. Atherosclerotic changes are noted of the thoracic aorta. There is no large centrally located pulmonary embolism. Detection of smaller  pulmonary emboli is limited by contrast timing.  Mediastinum/Nodes:  -- No mediastinal lymphadenopathy.  -- No hilar lymphadenopathy.  -- No axillary lymphadenopathy.  -- No supraclavicular lymphadenopathy.  -- Normal thyroid gland where visualized.  -  Unremarkable esophagus.  Lungs/Pleura: Airways are patent. No pleural effusion, lobar consolidation, pneumothorax or pulmonary infarction.  Upper Abdomen: There is cirrhosis with stigmata of portal hypertension. Again noted is atrophy of the right kidney with associated hydronephrosis.  Musculoskeletal: No chest wall abnormality. No bony spinal canal stenosis.  Review of the MIP images confirms the above findings.  IMPRESSION: 1. No acute findings in the chest. 2. Cirrhosis with stigmata of portal hypertension. 3. Atrophic right kidney with associated hydronephrosis, better appreciated on the patient's recent CT abdomen pelvis.  Aortic Atherosclerosis (ICD10-I70.0).  Electronically Signed   By: CConstance HolsterM.D.   On: 02/21/2020 01:42 Note: Reviewed        Physical  Exam  General appearance: Well nourished, well developed, and well hydrated. In no apparent acute distress Mental status: Alert, oriented x 3 (person, place, & time)       Respiratory: No evidence of acute respiratory distress Eyes: PERLA Vitals: BP (!) 126/62 (BP Location: Right Arm, Patient Position: Sitting, Cuff Size: Large)   Pulse (!) 122   Temp (!) 97.2 F (36.2 C) (Temporal)   Resp 17   Ht 5' 3"  (1.6 m)   Wt 165 lb (74.8 kg)   SpO2 100%   BMI 29.23 kg/m  BMI: Estimated body mass index is 29.23 kg/m as calculated from the following:   Height as of this encounter: 5' 3"  (1.6 m).   Weight as of this encounter: 165 lb (74.8 kg). Ideal: Ideal body weight: 52.4 kg (115 lb 8.3 oz) Adjusted ideal body weight: 61.4 kg (135 lb 5 oz)  Assessment   Status Diagnosis  Controlled Persistent Unimproved 1. Chronic pain syndrome   2. Chronic  low back pain (Primary area of Pain) (Bilateral) (L>R) w/ sciatica (Bilateral)   3. Lumbar facet syndrome (Bilateral) (R>L)   4. Chronic lower extremity pain (Secondary Area of Pain) (Bilateral) (L>R)   5. Fibromyalgia syndrome   6. Myofascial pain syndrome   7. Pharmacologic therapy   8. Chronic musculoskeletal pain   9. Chronic intermittent abdominal pain      Updated Problems: Problem  Chronically On Opiate Therapy   Plan of Care  Problem-specific:  No problem-specific Assessment & Plan notes found for this encounter.  Ms. KAIZLEE CARLINO has a current medication list which includes the following long-term medication(s): albuterol, insulin detemir, nitroglycerin, sertraline, [START ON 04/08/2020] cyclobenzaprine, and [START ON 04/08/2020] oxycodone.  Pharmacotherapy (Medications Ordered): Meds ordered this encounter  Medications  . oxyCODONE (OXY IR/ROXICODONE) 5 MG immediate release tablet    Sig: Take 1 tablet (5 mg total) by mouth every 4 (four) hours as needed for severe pain. Must last 30 days. Max: 6/day    Dispense:  180 tablet    Refill:  0    Chronic Pain: STOP Act (Not applicable) Fill 1 day early if closed on refill date. Do not fill until: 04/08/2020. To last until: 05/08/2020. Avoid benzodiazepines within 8 hours of opioids  . DISCONTD: oxyCODONE (OXY IR/ROXICODONE) 5 MG immediate release tablet    Sig: Take 1 tablet (5 mg total) by mouth every 4 (four) hours as needed for severe pain. Must last 30 days. Max: 6/day    Dispense:  180 tablet    Refill:  0    Chronic Pain: STOP Act (Not applicable) Fill 1 day early if closed on refill date. Do not fill until: 05/08/2020. To last until: 06/07/2020. Avoid benzodiazepines within 8 hours of opioids  . DISCONTD: oxyCODONE (OXY IR/ROXICODONE) 5 MG immediate release tablet    Sig: Take 1 tablet (5 mg total) by mouth every 4 (four) hours as needed for severe pain. Must last 30 days. Max: 6/day    Dispense:  180 tablet    Refill:  0     Chronic Pain: STOP Act (Not applicable) Fill 1 day early if closed on refill date. Do not fill until: 06/07/2020. To last until: 07/07/2020. Avoid benzodiazepines within 8 hours of opioids  . cyclobenzaprine (FLEXERIL) 10 MG tablet    Sig: Take 1 tablet (10 mg total) by mouth 3 (three) times daily as needed for muscle spasms. Must last 30 days.    Dispense:  90 tablet  Refill:  2    Fill one day early if pharmacy is closed on scheduled refill date. May substitute for generic if available.   Orders:  Orders Placed This Encounter  Procedures  . ToxASSURE Select 13 (MW), Urine    Volume: 30 ml(s). Minimum 3 ml of urine is needed. Document temperature of fresh sample. Indications: Long term (current) use of opiate analgesic (I01.655)    Order Specific Question:   Release to patient    Answer:   Immediate   Follow-up plan:   Return in about 4 weeks (around 05/04/2020) for F2F encounter, MM (on eval day) to review UDS.      Interventional management options:  Considering:   NOTE: Eliquis ANTICOAGULATION (Stop: 3 days  Restart: 6 hrs)  Diagnostic bilateral celiac plexus block  Diagnostic bilateral lumbar facet block  Possible bilateral lumbar facet RFA  Diagnostic bilateral cervical facet block  Possible bilateral cervical facet RFA    PRN Procedures:   None at this time    Recent Visits No visits were found meeting these conditions. Showing recent visits within past 90 days and meeting all other requirements Today's Visits Date Type Provider Dept  04/06/20 Office Visit Milinda Pointer, MD [facility]  Showing today's visits and meeting all other requirements Future Appointments Date Type Provider Dept  05/04/20 Appointment Milinda Pointer, MD [facility]  Showing future appointments within next 90 days and meeting all other requirements  I discussed the assessment and treatment plan with the patient. The patient was provided an opportunity to ask  questions and all were answered. The patient agreed with the plan and demonstrated an understanding of the instructions.  Patient advised to call back or seek an in-person evaluation if the symptoms or condition worsens.  Duration of encounter: 55 minutes.  Note by: Gaspar Cola, MD Date: 04/06/2020; Time: 12:55 PM

## 2020-04-06 ENCOUNTER — Ambulatory Visit: Payer: Medicare HMO | Attending: Pain Medicine | Admitting: Pain Medicine

## 2020-04-06 ENCOUNTER — Other Ambulatory Visit: Payer: Self-pay

## 2020-04-06 ENCOUNTER — Encounter: Payer: Self-pay | Admitting: Pain Medicine

## 2020-04-06 VITALS — BP 126/62 | HR 122 | Temp 97.2°F | Resp 17 | Ht 63.0 in | Wt 165.0 lb

## 2020-04-06 DIAGNOSIS — G894 Chronic pain syndrome: Secondary | ICD-10-CM | POA: Diagnosis not present

## 2020-04-06 DIAGNOSIS — M5441 Lumbago with sciatica, right side: Secondary | ICD-10-CM | POA: Insufficient documentation

## 2020-04-06 DIAGNOSIS — M5442 Lumbago with sciatica, left side: Secondary | ICD-10-CM | POA: Insufficient documentation

## 2020-04-06 DIAGNOSIS — Z79899 Other long term (current) drug therapy: Secondary | ICD-10-CM | POA: Insufficient documentation

## 2020-04-06 DIAGNOSIS — M797 Fibromyalgia: Secondary | ICD-10-CM | POA: Insufficient documentation

## 2020-04-06 DIAGNOSIS — M79604 Pain in right leg: Secondary | ICD-10-CM | POA: Diagnosis not present

## 2020-04-06 DIAGNOSIS — M7918 Myalgia, other site: Secondary | ICD-10-CM | POA: Diagnosis not present

## 2020-04-06 DIAGNOSIS — R109 Unspecified abdominal pain: Secondary | ICD-10-CM | POA: Diagnosis not present

## 2020-04-06 DIAGNOSIS — M47816 Spondylosis without myelopathy or radiculopathy, lumbar region: Secondary | ICD-10-CM | POA: Diagnosis not present

## 2020-04-06 DIAGNOSIS — G8929 Other chronic pain: Secondary | ICD-10-CM | POA: Diagnosis not present

## 2020-04-06 DIAGNOSIS — M79605 Pain in left leg: Secondary | ICD-10-CM | POA: Diagnosis present

## 2020-04-06 MED ORDER — OXYCODONE HCL 5 MG PO TABS: 5.0000 mg | ORAL_TABLET | ORAL | 0 refills | Status: DC | PRN

## 2020-04-06 MED ORDER — CYCLOBENZAPRINE HCL 10 MG PO TABS: 10.0000 mg | ORAL_TABLET | Freq: Three times a day (TID) | ORAL | 2 refills | Status: DC | PRN

## 2020-04-06 NOTE — Progress Notes (Signed)
On discharge, Pt was informed that she needed another urine specimen because no medications showed up in her urine last time. Pt stated the she has been throwing up in the last three days so she know that nothing will show up this time too. A urine specimen was obtained and sent to lab. A medication agreement was signed and she was given a copy of the agreement. She was also informed to bring her medication bottle with her on the next appointment even if the bottle is empty. The pharmacy was called and prescriptions for oxycodone was cancel for 05/08/20 and 06/07/20.

## 2020-04-06 NOTE — Progress Notes (Signed)
Nursing Pain Medication Assessment:  Safety precautions to be maintained throughout the outpatient stay will include: orient to surroundings, keep bed in low position, maintain call bell within reach at all times, provide assistance with transfer out of bed and ambulation.  Medication Inspection Compliance: [name] did not comply with our request to bring her pills to be counted. She was reminded that bringing the medication bottles, even when empty, is a requirement.  Medication: None brought in. Pill/Patch Count: None available to be counted. Bottle Appearance: No container available. Did not bring bottle(s) to appointment. Filled Date: N/A Last Medication intake:  YesterdaySafety precautions to be maintained throughout the outpatient stay will include: orient to surroundings, keep bed in low position, maintain call bell within reach at all times, provide assistance with transfer out of bed and ambulation.

## 2020-04-07 ENCOUNTER — Other Ambulatory Visit: Payer: Self-pay | Admitting: Primary Care

## 2020-04-07 ENCOUNTER — Ambulatory Visit: Payer: Medicare HMO | Admitting: Pain Medicine

## 2020-04-07 DIAGNOSIS — E1165 Type 2 diabetes mellitus with hyperglycemia: Secondary | ICD-10-CM

## 2020-04-08 LAB — TOXASSURE SELECT 13 (MW), URINE

## 2020-04-12 ENCOUNTER — Telehealth: Payer: Self-pay

## 2020-04-12 NOTE — Telephone Encounter (Signed)
Please attempt to contact patient for scheduling.

## 2020-04-12 NOTE — Telephone Encounter (Signed)
Unable to reach pt or pts husband by phone.

## 2020-04-12 NOTE — Telephone Encounter (Signed)
[facility] - Client TELEPHONE ADVICE RECORD [facility] Patient Name: [name] Gender: Female DOB: [birth year] Age: 64 Y 3 M 12 D Return Phone Number: [phone] (Primary) Address: City/State/Zip: [place] [address] Client [facility] - Client Client Site [place] [facility] - Day Physician Alma Friendly - NP Contact Type Call Who Is Calling Patient / Member / Family / Caregiver Call Type Triage / Clinical Relationship To Patient Self Return Phone Number [phone] (Primary) Chief Complaint FAINTING or [name] Reason for Call Symptomatic / Request for [place] states she is calling to schedule an appointment with [name]. She has been having bad stomach problems and passing out. She has fallen 5 times. She is dizzy and can barely walk and uses a walker all the time. [name] for eval and treat Translation No Nurse Assessment Nurse: Wynetta Emery, RN, Baker Janus Date/Time [name] Time): 04/11/2020 4:29:43 PM Confirm and document reason for call. If symptomatic, describe symptoms. ---[name] states for 6 days she has been having bad upper part of stomach; and passing out. She has fallen 5 times. She notes she has hurt self in left rib cage area from one of the falls --hurts to breath. She is dizzy and can barely walk and uses a walker all the time. Has the patient had close contact with a person known or suspected to have the novel coronavirus illness OR traveled / lives in area with major community spread (including international travel) in the last 14 days from the onset of symptoms? * If Asymptomatic, screen for exposure and travel within the last 14 days. ---No Does the patient have any new or worsening symptoms? ---Yes Will a triage be completed? ---Yes Related visit to physician within the last 2 weeks? ---No Does the PT have  any chronic conditions? (i.e. diabetes, asthma, this includes High risk factors for pregnancy, etc.) ---Yes List chronic conditions. ---back pain chronic muscle cramping heart Is this a behavioral health or substance abuse call? ---No PLEASE NOTE: All timestamps contained within this report are represented as [place] Standard Time. CONFIDENTIALTY NOTICE: This fax transmission is intended only for the addressee. It contains information that is legally privileged, confidential or otherwise protected from use or disclosure. If you are not the intended recipient, you are strictly prohibited from reviewing, disclosing, copying using or disseminating any of this information or taking any action in reliance on or regarding this information. If you have received this fax in error, please notify [name] immediately by telephone so that we can arrange for its return to [name]. Phone: [phone], Toll-Free: [phone], Fax: [phone] Page: 2 of 2 Call Id: [id number] Guidelines Guideline Title Affirmed Question Affirmed Notes Nurse Date/Time [name] Time) Fainting [1] Age > 50 years AND [2] now alert and feels fine [name] 04/11/2020 4:33:27 PM Disp. Time [name] Time) Disposition Final User 04/11/2020 4:25:16 PM Send to Urgent Kathalene Frames, [place] 04/11/2020 4:37:13 PM Go to ED Now (or PCP triage) Yes Wynetta Emery, RN, Christin Bach Disagree/Comply Comply Caller Understands Yes PreDisposition Call Doctor Care Advice Given Per Guideline GO TO ED NOW (OR PCP TRIAGE): * IF PCP SECOND-LEVEL TRIAGE REQUIRED: You may need to be seen. Your doctor (or NP/PA) will want to talk with you to decide what's best. I'll page the provider on-call now. If you haven't heard from the provider (or me) within 30 minutes, go directly to the [facility] at [facility]. * It  is better and safer if another adult drives instead of you. CALL BACK IF: * You become worse. CARE ADVICE given per Fainting (Adult)  guideline. Referrals GO TO FACILITY OTHER - SPECIFY

## 2020-04-14 IMAGING — CR DG CHEST 2V
2 series · 2 of 2 positions shown · non-contrast
Comparison: 12/28/2017 and 07/09/2016

CLINICAL DATA: Altered mental status.  Weakness.

EXAM:
CHEST - 2 VIEW

[chest lat]
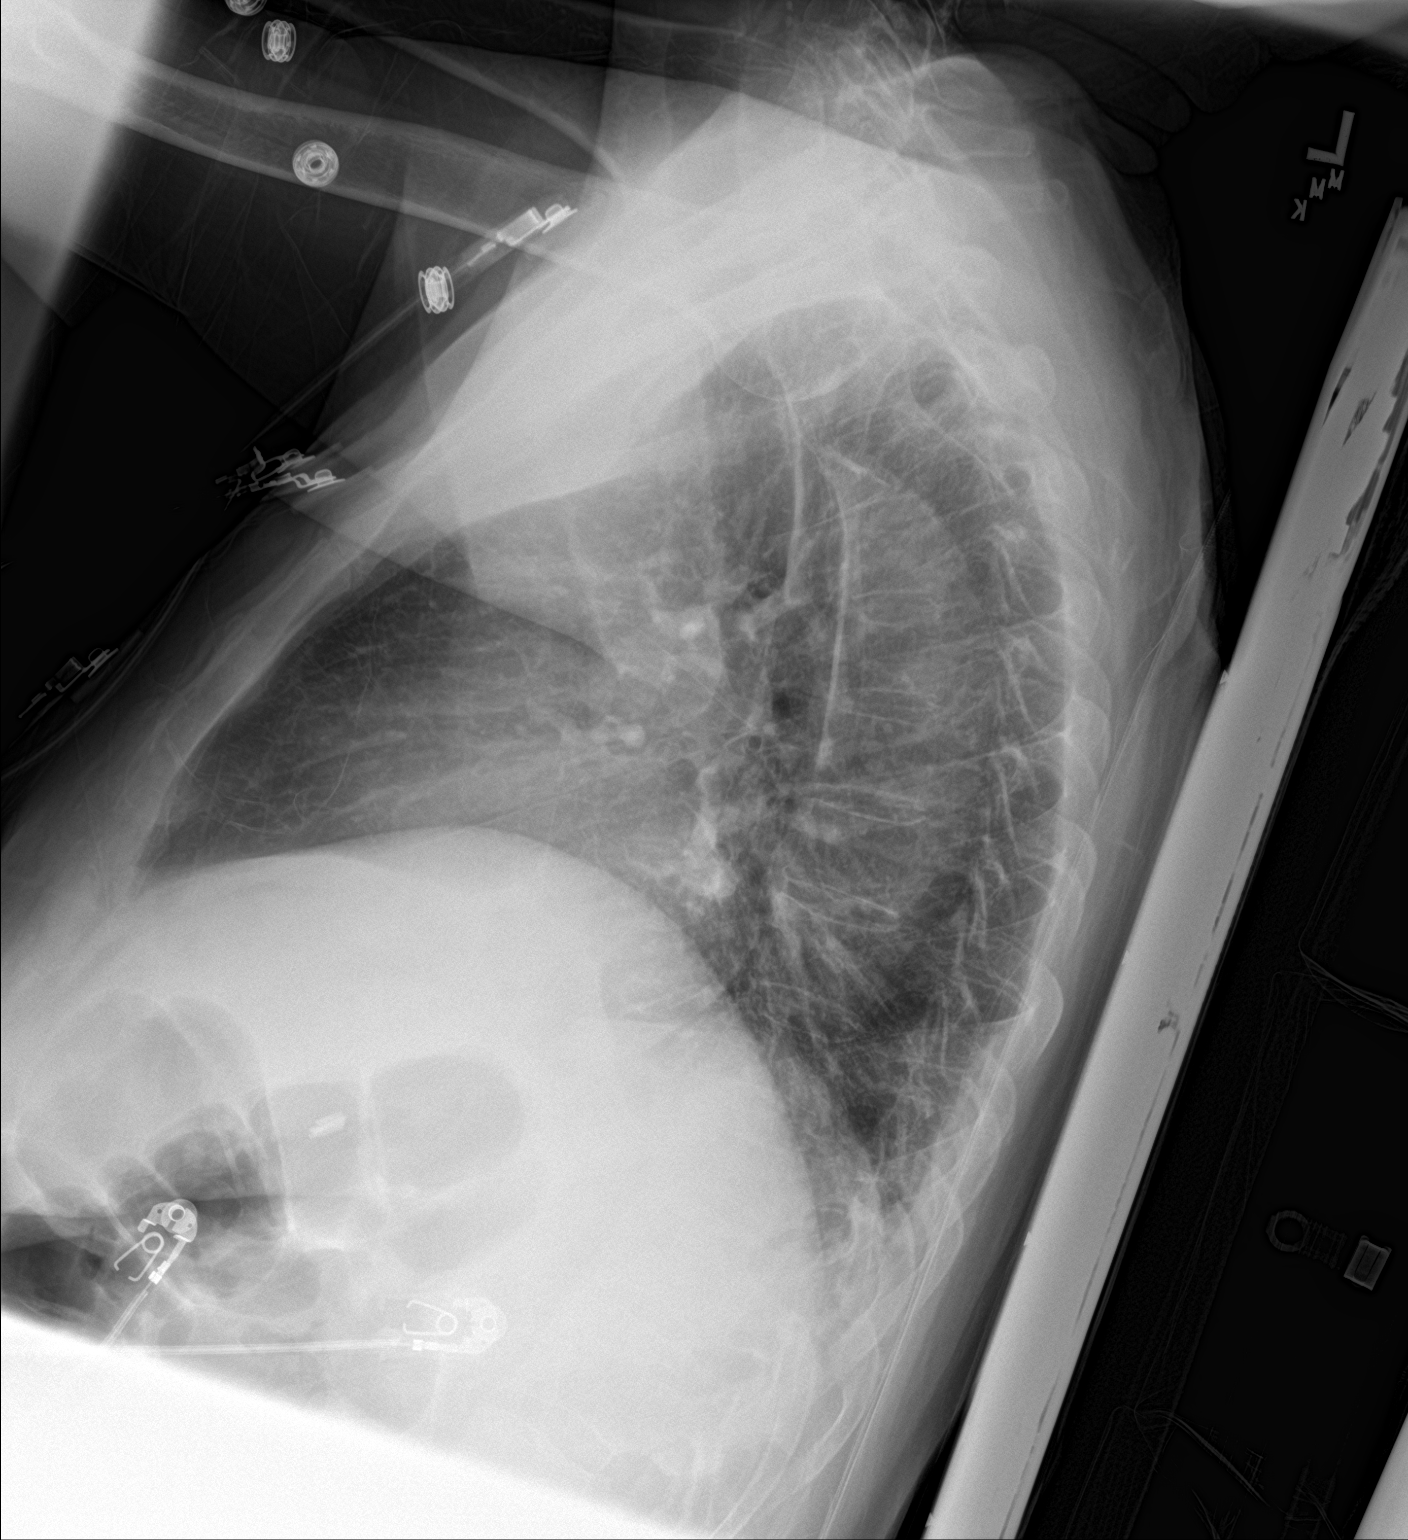

[chest ap]
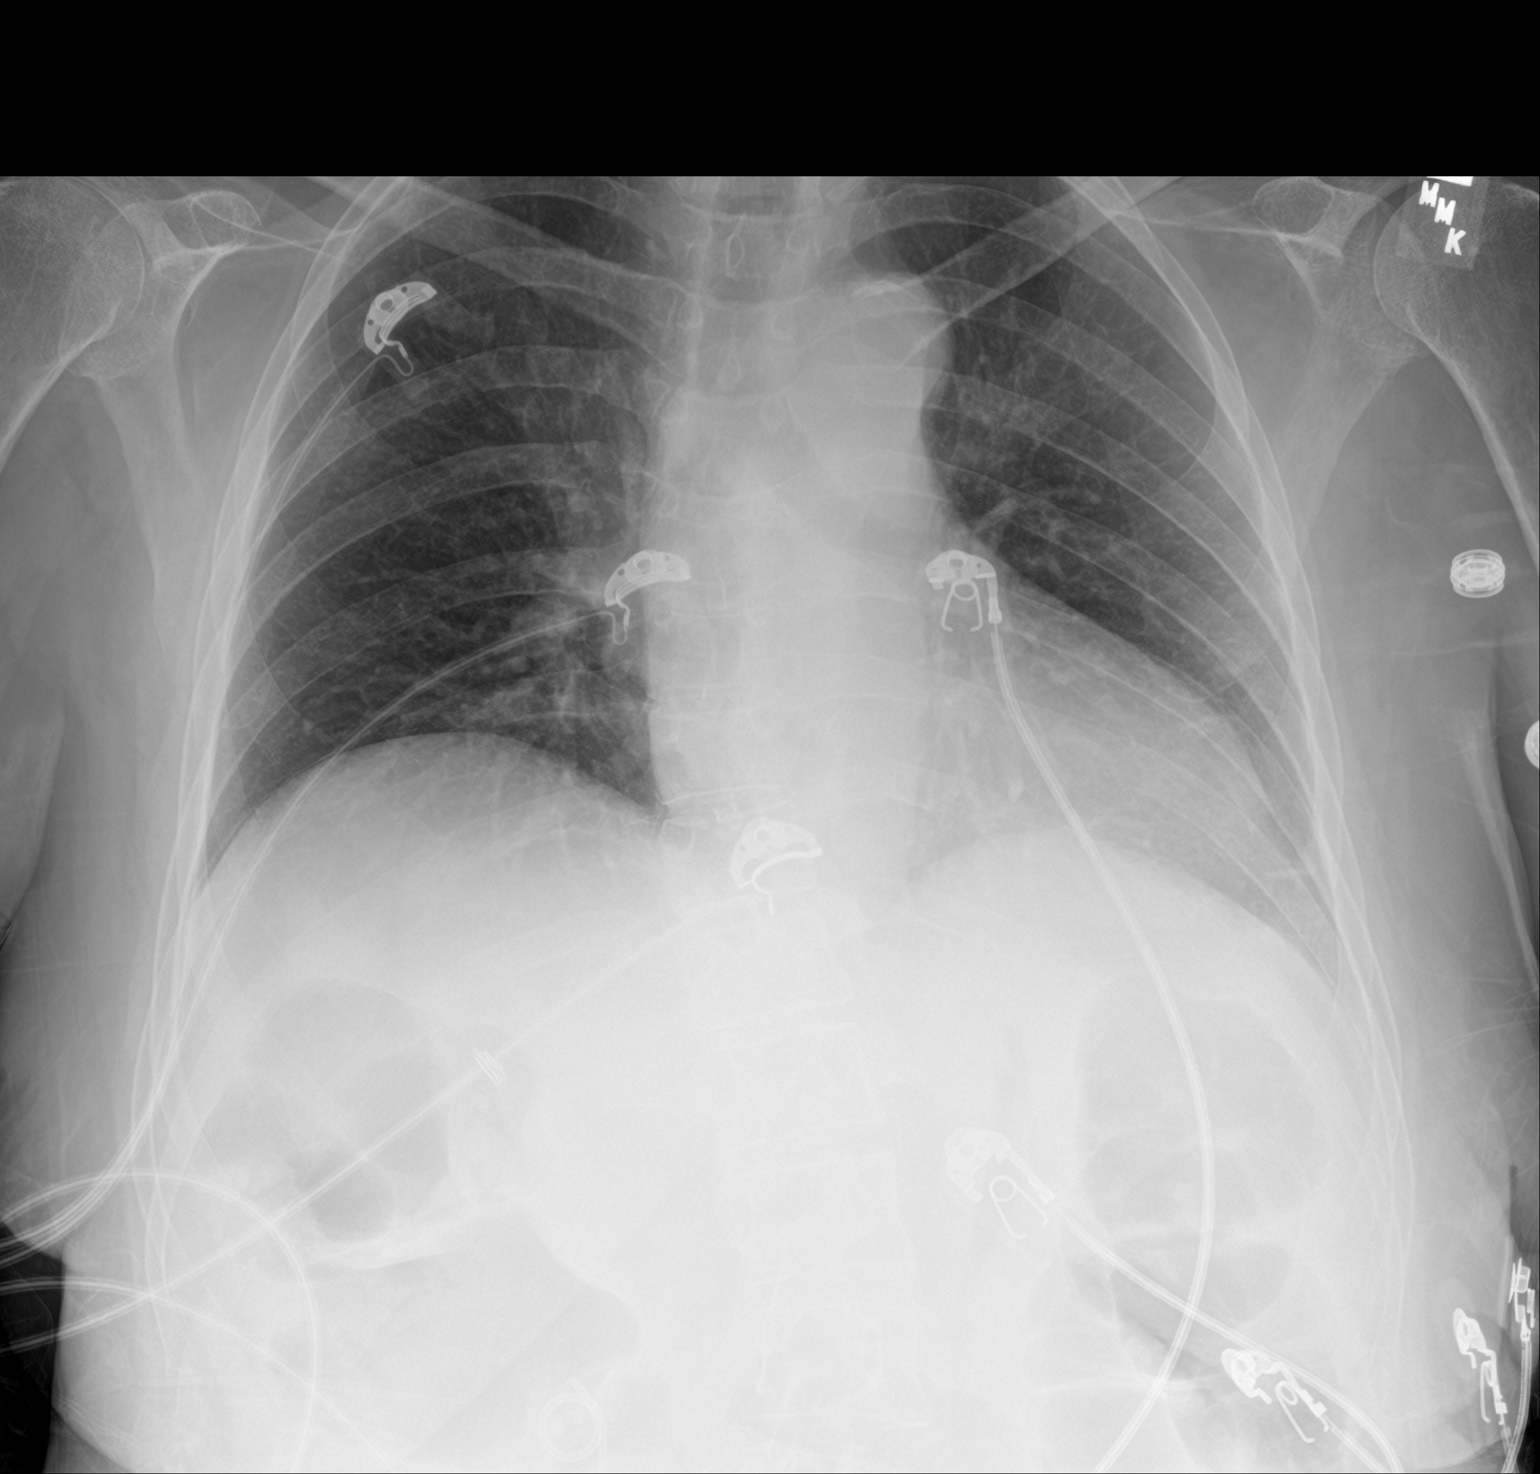

[2 of 2 positions shown; findings below may reference images not displayed]

FINDINGS: The heart size and mediastinal contours are within normal limits.
Both lungs are clear. The visualized skeletal structures are
unremarkable.
IMPRESSION: No active cardiopulmonary disease.

## 2020-04-14 IMAGING — CT CT HEAD W/O CM
4 series · 16 of 47 positions shown, 18 images · non-contrast
Comparison: 12/29/2017

CLINICAL DATA: Altered level of consciousness.  Confusion

EXAM:
CT HEAD WITHOUT CONTRAST
TECHNIQUE: Contiguous axial images were obtained from the base of the skull
through the vertex without intravenous contrast.

[Series 3: head wo · axial · 0.42mm/px · z∈[-120,-6]mm · 7 of 31 slices shown, 9 images]
[im 4/31  brain]
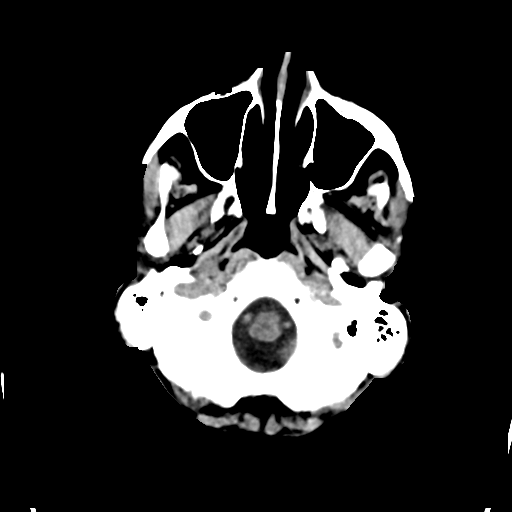
[im 4/31  bone]
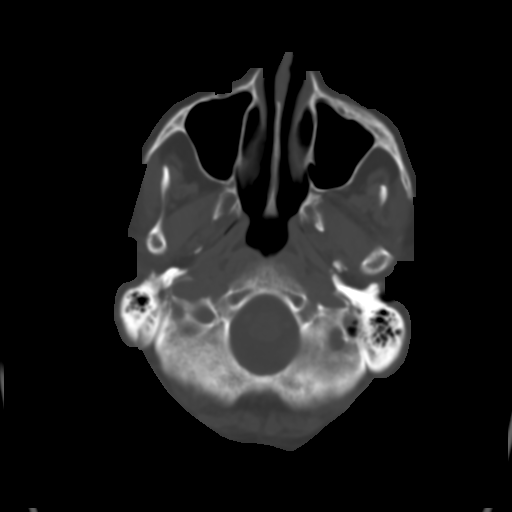
[im 8/31  brain]
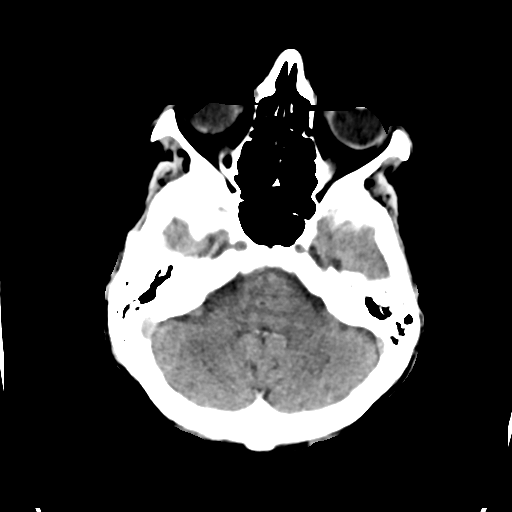
[im 12/31  brain]
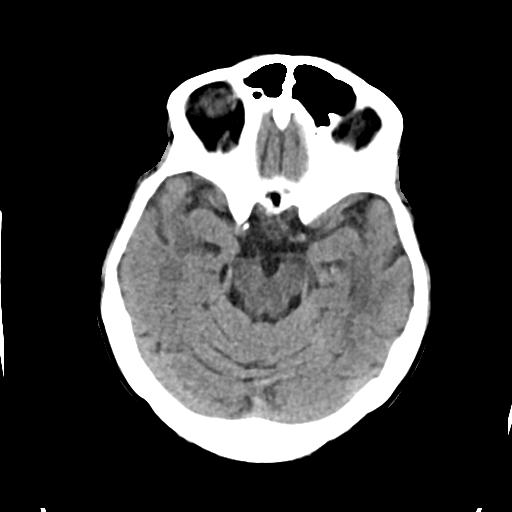
[im 16/31  brain]
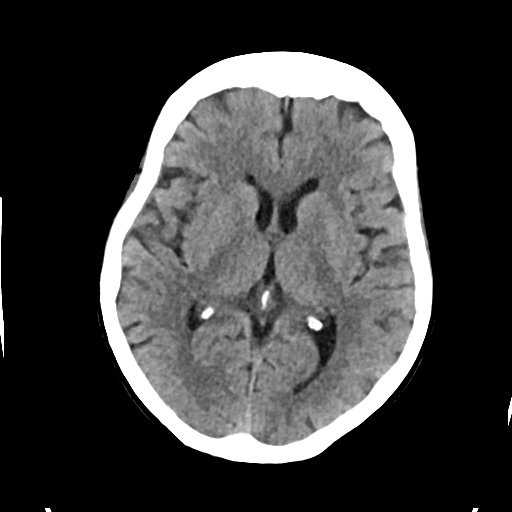
[im 19/31  brain]
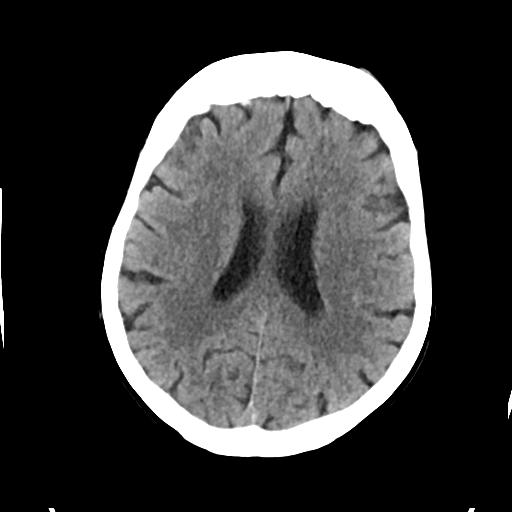
[im 19/31  bone]
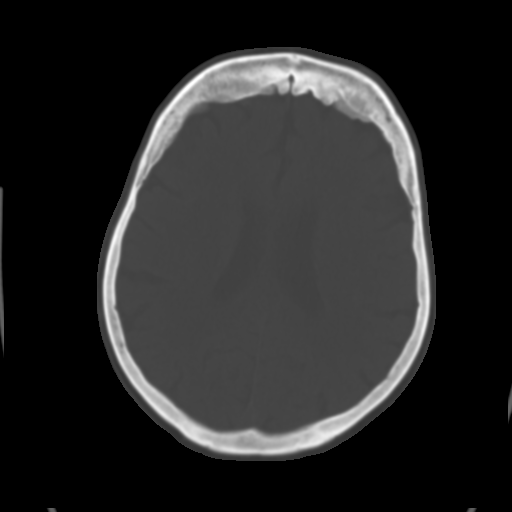
[im 23/31  brain]
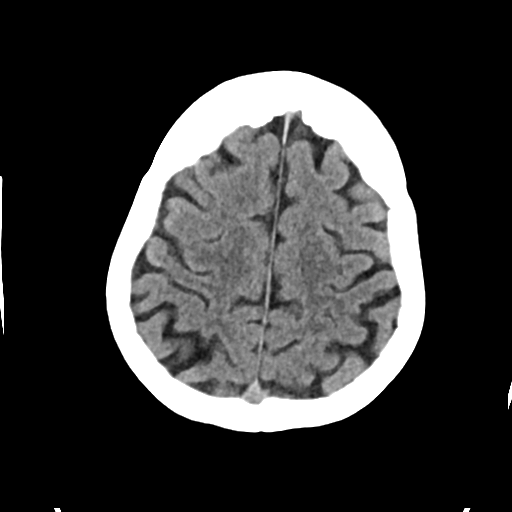
[im 27/31  brain]
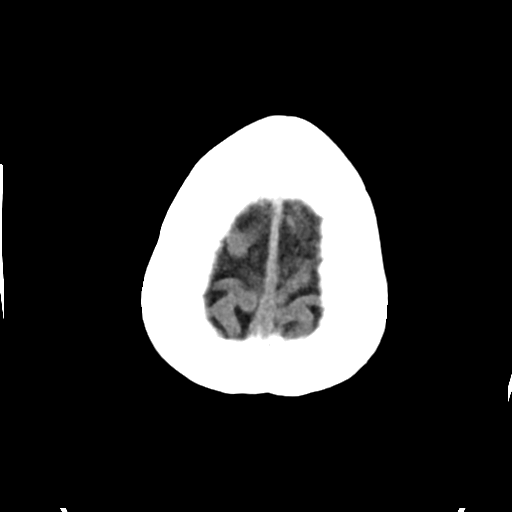

[Series 4: head bone · axial · 0.42mm/px · z∈[-122,-92]mm · 3 of 77 slices shown]
[im 8/77  bone]
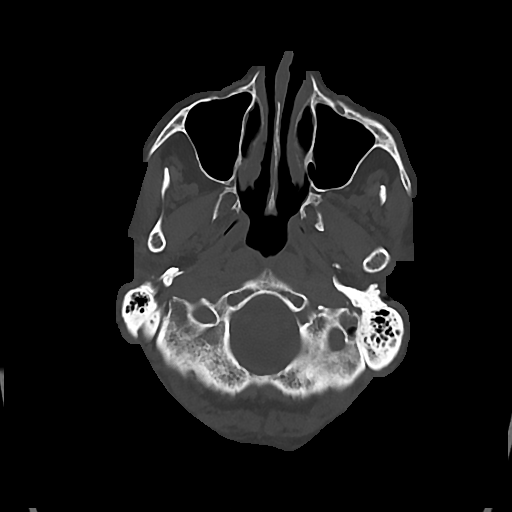
[im 16/77  bone]
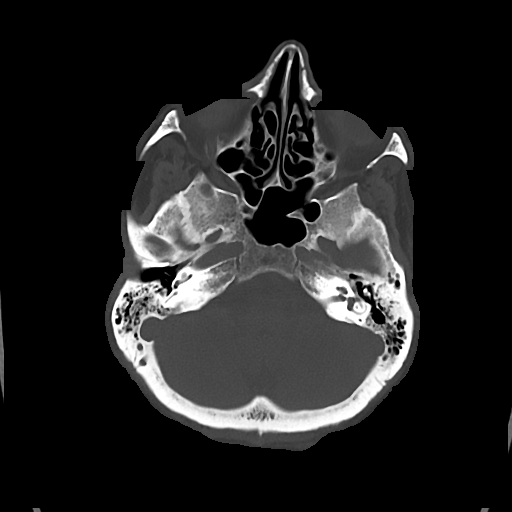
[im 23/77  bone]
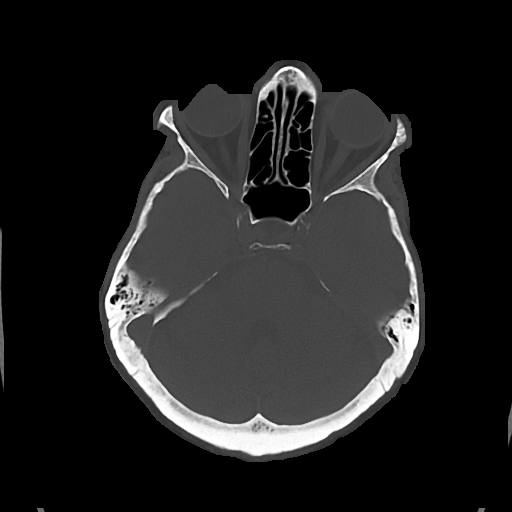

[Series 5: cor soft · coronal · 0.31mm/px · 3 of 67 slices shown]
[im 23/67  brain]
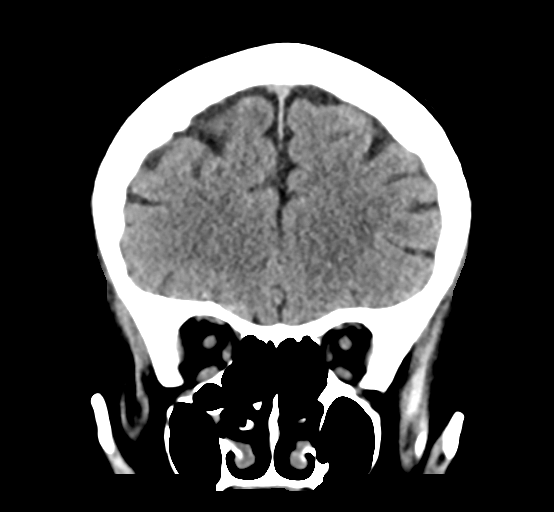
[im 30/67  brain]
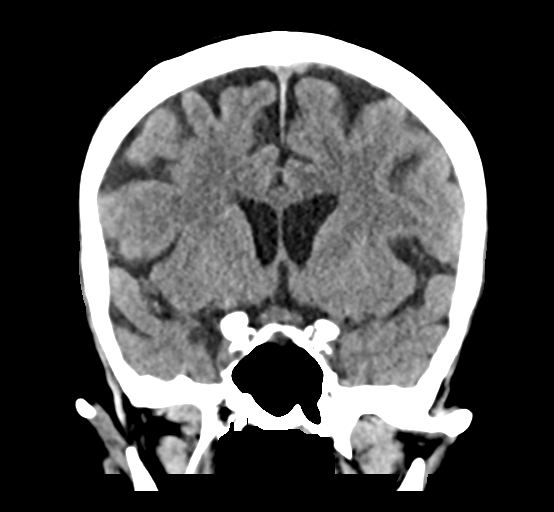
[im 37/67  brain]
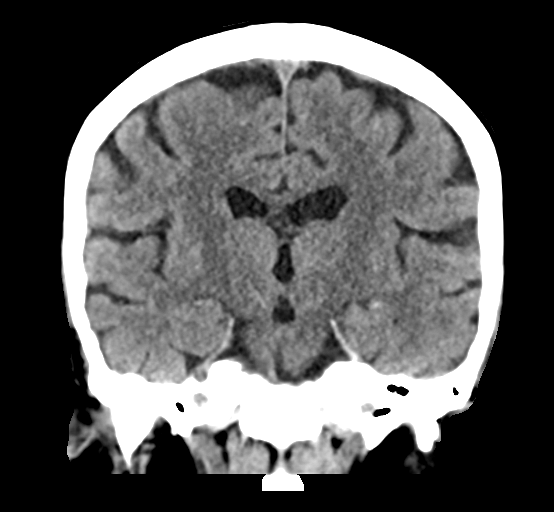

[Series 6: sag soft · sagittal · 0.30mm/px · 3 of 56 slices shown]
[im 19/56  brain]
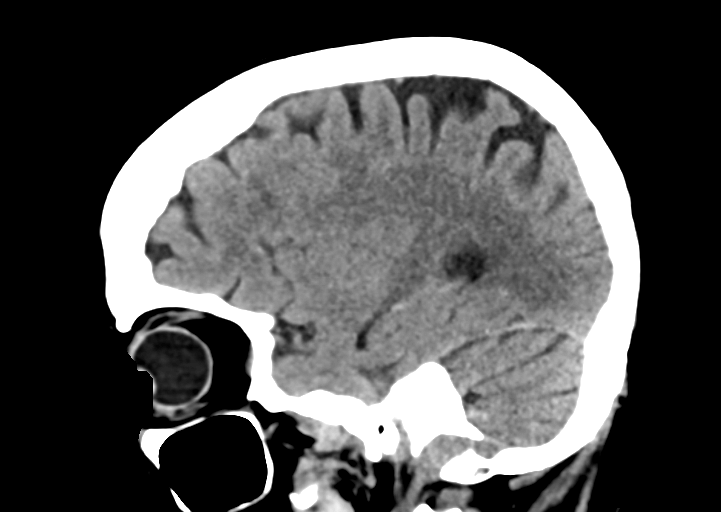
[im 28/56  brain]
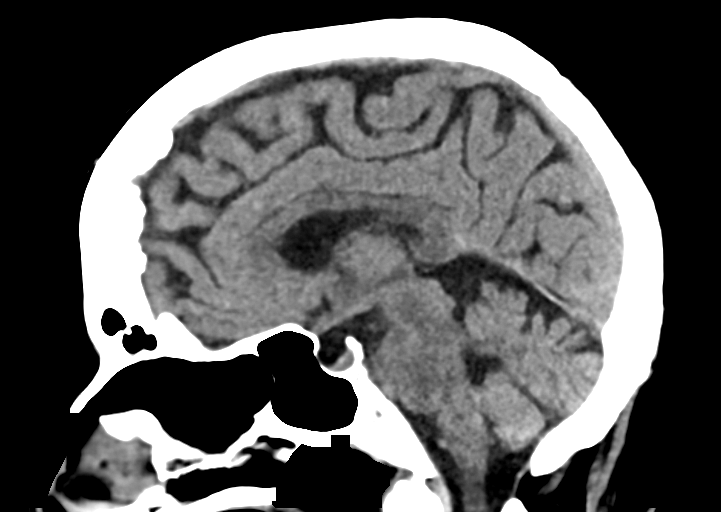
[im 37/56  brain]
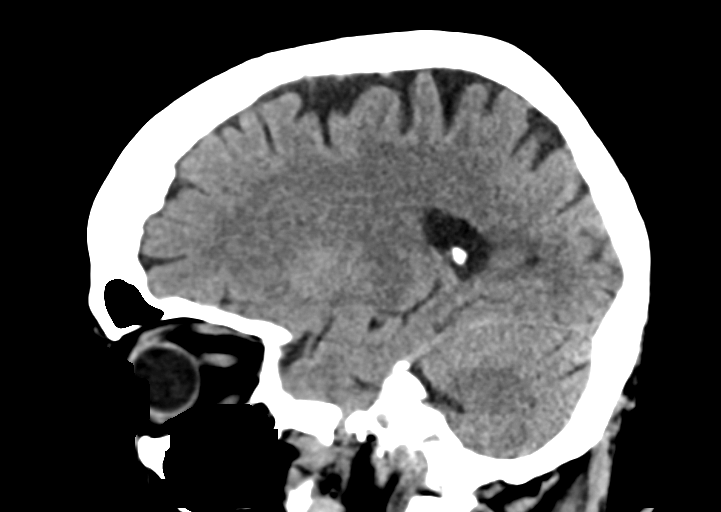

[16 of 47 positions shown; findings below may reference images not displayed]

FINDINGS: Brain: Mild age related volume loss, stable. No acute intracranial
abnormality. Specifically, no hemorrhage, hydrocephalus, mass
lesion, acute infarction, or significant intracranial injury.

Vascular: No hyperdense vessel or unexpected calcification.

Skull: No acute calvarial abnormality.

Sinuses/Orbits: Visualized paranasal sinuses and mastoids clear.
Orbital soft tissues unremarkable.

Other: None
IMPRESSION: No acute intracranial abnormality.

## 2020-05-04 ENCOUNTER — Ambulatory Visit: Payer: Medicare HMO | Attending: Pain Medicine | Admitting: Pain Medicine

## 2020-05-04 ENCOUNTER — Ambulatory Visit: Payer: Medicare HMO | Admitting: Pain Medicine

## 2020-05-04 ENCOUNTER — Other Ambulatory Visit: Payer: Self-pay

## 2020-05-04 ENCOUNTER — Encounter: Payer: Self-pay | Admitting: Pain Medicine

## 2020-05-04 ENCOUNTER — Other Ambulatory Visit: Payer: Self-pay | Admitting: Pain Medicine

## 2020-05-04 ENCOUNTER — Telehealth: Payer: Self-pay | Admitting: *Deleted

## 2020-05-04 ENCOUNTER — Telehealth: Payer: Self-pay

## 2020-05-04 DIAGNOSIS — G894 Chronic pain syndrome: Secondary | ICD-10-CM | POA: Diagnosis not present

## 2020-05-04 DIAGNOSIS — G8929 Other chronic pain: Secondary | ICD-10-CM

## 2020-05-04 DIAGNOSIS — Z79899 Other long term (current) drug therapy: Secondary | ICD-10-CM | POA: Diagnosis not present

## 2020-05-04 DIAGNOSIS — M5442 Lumbago with sciatica, left side: Secondary | ICD-10-CM

## 2020-05-04 DIAGNOSIS — M79604 Pain in right leg: Secondary | ICD-10-CM

## 2020-05-04 DIAGNOSIS — M79605 Pain in left leg: Secondary | ICD-10-CM

## 2020-05-04 DIAGNOSIS — M5441 Lumbago with sciatica, right side: Secondary | ICD-10-CM

## 2020-05-04 MED ORDER — MORPHINE SULFATE ER 15 MG PO TBCR: 15.0000 mg | EXTENDED_RELEASE_TABLET | Freq: Two times a day (BID) | ORAL | 0 refills | Status: DC

## 2020-05-04 NOTE — Addendum Note (Signed)
Addended by: Milinda Pointer A on: 05/04/2020 08:07 PM   Modules accepted: Orders, Level of Service

## 2020-05-04 NOTE — Telephone Encounter (Signed)
LM with family member to have patient call us as soon as she gets this message for pre virtual appointment questions.

## 2020-05-04 NOTE — Progress Notes (Addendum)
Patient: [name]  Service Category: E/M  Provider: Gaspar Cola, MD  DOB: 19-Mar-1956  DOS: 05/04/2020  Location: Office  MRN: [id number]  Setting: Ambulatory outpatient  Referring Provider: [name], NP  Type: Established Patient  Specialty: Interventional Pain Management  PCP: [name], NP  Location: Remote location  Delivery: TeleHealth     Virtual Encounter - Pain Management PROVIDER NOTE: Information contained herein reflects review and annotations entered in association with encounter. Interpretation of such information and data should be left to medically-trained personnel. Information provided to patient can be located elsewhere in the medical record under "Patient Instructions". Document created using STT-dictation technology, any transcriptional errors that may result from process are unintentional.    Contact & Pharmacy Preferred: [phone] Home: [phone] (home) Mobile: [phone] (mobile) E-mail: No e-mail address on record  [place], [place] - [address] [place] [address] Phone: [phone] Fax: [phone]   Pre-screening  Ms. Ceasar offered "in-person" vs "virtual" encounter. She indicated preferring virtual for this encounter.   Reason COVID-19*  Social distancing based on CDC and AMA recommendations.   I contacted [name] on 05/04/2020 via telephone.      I clearly identified myself as Gaspar Cola, MD. I verified that I was speaking with the correct person using two identifiers (Name: [name], and date of birth: 1956/02/25).  Consent I sought verbal advanced consent from [name] for virtual visit interactions. I informed [name] of possible security and privacy concerns, risks, and limitations associated with providing "not-in-person" medical evaluation and management services. I also informed [name] of the availability of "in-person" appointments.  Finally, I informed her that there would be a charge for the virtual visit and that she could be  personally, fully or partially, financially responsible for it. [name] expressed understanding and agreed to proceed.   Historic Elements   [name] is a 64 y.o. year old, female patient evaluated today after her last contact with our practice on 05/04/2020. [name]  has a past medical history of Acute encephalopathy (05/22/2018), Allergy, Anginal pain ([place]), Anxiety, Ascites, C. difficile colitis (07/10/2015), Cancer ([place]), Cirrhosis of liver not due to alcohol ([place]) (2016), Degenerative disk disease, Diverticulitis, Gastroparesis, GERD (gastroesophageal reflux disease), History of hiatal hernia, Hypertension, Hypothyroid, Hypothyroidism due to amiodarone, Ileus ([place]) (08/01/2015), Intussusception intestine ([place]) (05/2015), Orthostatic hypotension, PAF (paroxysmal atrial fibrillation) ([place]) (03/2015), Pancreatitis, Pneumonia (11/14/2015), Right ureteral stone (07/14/2016), Sepsis ([place]) (12/15/2017), Sick sinus syndrome ([place]), Stomach ulcer, Stroke ([place]), Syncope (01/2015), Syncope due to orthostatic hypotension (05/18/2015), Tachyarrhythmia (01/10/2016), TIA (transient ischemic attack) (02/2015), Type 1 diabetes ([place]), UTERINE CANCER, HX OF (03/27/2007), and UTI (urinary tract infection) (05/22/2018). She also  has a past surgical history that includes Hernia repair; Abdominal hysterectomy; Cholecystectomy; Esophagogastroduodenoscopy (N/A, 04/04/2015); Cardiac catheterization (N/A, 01/12/2016); Esophagogastroduodenoscopy (egd) with propofol (N/A, 01/18/2016); Flexible sigmoidoscopy (N/A, 01/18/2016); Cystoscopy/ureteroscopy/holmium laser (Right, 07/14/2016); Esophagogastroduodenoscopy (N/A, 12/28/2017); Cystoscopy with biopsy (N/A, 12/11/2019); Cystoscopy with fulgeration (N/A, 12/11/2019); Cystoscopy w/ retrogrades (Bilateral, 12/11/2019); Ureteroscopy (Right, 12/11/2019); Cystoscopy with stent placement (Right, 12/11/2019); and  Cystoscopy with urethral dilatation (Right, 12/11/2019). [name] has a current medication list which includes the following prescription(s): albuterol, atorvastatin, cholecalciferol, cyclobenzaprine, accu-chek aviva plus, levemir flextouch, magnesium gluconate, metoprolol tartrate, nitroglycerin, sertraline, and [START ON 05/08/2020] morphine. She  reports that she has quit smoking. Her smoking use included cigarettes. She has never used smokeless tobacco. She reports that she  does not drink alcohol and does not use drugs. [name] is allergic to aspirin, codeine sulfate, erythromycin, prednisone, rosiglitazone maleate, and tetanus-diphtheria toxoids td.   HPI  Today, she is being contacted for medication management.  Today I had an extremely long conversation with the patient about the reason why she did not have any of her medicine on her UDS. The patient was schedule to come in to a face-to-face visit.  However she called and she indicated that she could not make it because she was in court, with a criminal case involving her son. The patient's most recent UDS on 04/06/2020 came back negative for any oxycodone.  Her last medication refill had been on 03/10/2020 and it should have lasted until 04/09/2020.  Her last urine drug screen test (02/04/2020) did not have any medication on it. On 04/06/2020 I questioned the patient about this and she indicated that it was due to her being off of the medicine, apparently on my request, for the purpose of doing a drug holiday. However, according to the patient PMP on 01/09/2020 she filled one of my prescriptions for 180 tablets, which should have lasted until 02/08/2020.  In fact on 02/08/2020 she had the next prescription filled.  This means that she was not really telling me the truth. She did not go through any type of drug holidays during that time.  Since there was no oxycodone on her UDS, on 04/06/2020 we had ordered a repeat test.  Interestingly, she indicates that there  would be no medication in it since she had been throwing up for the past 3 days.  Initially she said that she threw up her medication that day, but now she then changed the story saying that he had been for the past 3 days.  Interestingly, she was not having any withdrawal symptoms.  Furthermore, she asked me if she would be able to get her medicine that day.  There is a question as to whenever the patient has taken more medication than prescribed and had ran out of medicine.  It is also interesting to note is that she did not bring her medications to the appointment for a pill count.  This again increased my level suspicion regarding the possibility that she may not be compliant with regards to how she takes the medication.   The patient is currently taking oxycodone IR 5 mg 1 tablet p.o. every 4 hours (30 mg/day of oxycodone) (45 MME).  Due to the shortage on the oxycodone, my intention today was to switch her to the MS Contin 15 mg, 1 tablet p.o. 3 times daily (45 mg/day of morphine) (45 MME).  The plan was to give her a single prescription and have her come in for a face-to-face visit at which time we would do a repeat UDS and a pill count.  The patient has already been warned about the practice of not bringing her pills to be counted and about the issues with the UDS having no medication.  This means that if by any chance she does not bring her pills to her appointment, or her next UDS again comes back negative, we will permanently discontinue her opioid analgesics due to a high suspicion of misuse.  Today, when I spoke to the patient she attempted to again use the excuse of the "drug holiday" to justify the reason why she had no medication in her last UDS.  I actually had to asked the patient to take up the paper and  a pen and write down the dates on the two urine drug screening test as well as the date that she did her "Drug Holiday".  Eventually she realized that she had only one excuse for one of the  urine drug screening test and she had none for the other one.  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  Today I have switched her to MS Contin 15 mg p.o. twice daily (30 mg/day of morphine) (30 MME).  Pharmacotherapy Assessment  Analgesic: Oxycodone IR 5 mg, 1 tab PO q 4 hrs (30 mg/day of oxycodone). Possibly High Risk. (See 04/06/20 note) MAX. MME: 200 mg/day MME/day: 45 mg/day.   Monitoring: [place] PMP: PDMP reviewed during this encounter.       Pharmacotherapy: No side-effects or adverse reactions reported. Compliance: No problems identified. Effectiveness: Clinically acceptable. Plan: Refer to "POC".  UDS:  Summary  Date Value Ref Range Status  04/06/2020 Note  Final    Comment:    ==================================================================== Tox[employer] Select 13 (MW) ==================================================================== Test                             Result       Flag       Units  Drug Absent but Declared for Prescription Verification   Oxycodone                      Not Detected UNEXPECTED ng/mg creat ==================================================================== Test                      Result    Flag   Units      Ref Range   Creatinine              39               mg/dL      >=20 ==================================================================== Declared Medications:  The flagging and interpretation on this report are based on the  following declared medications.  Unexpected results may arise from  inaccuracies in the declared medications.   **Note: The testing scope of this panel includes these medications:   Oxycodone (Roxicodone)   **Note: The testing scope of this panel does not include the  following reported medications:   Albuterol (Ventolin HFA)  Atorvastatin (Lipitor)  Cephalexin (Keflex)  Cyclobenzaprine (Flexeril)  Insulin (Levemir)  Magnesium (Magonate)   Nitroglycerin (Nitrostat)  Sertraline (Zoloft)  Vitamin D3 ==================================================================== For clinical consultation, please call [phone]. ====================================================================     Laboratory Chemistry Profile   Renal Lab Results  Component Value Date   BUN 11 02/20/2020   CREATININE 1.31 (H) 02/20/2020   GFR 42.53 (L) 08/18/2019   GFRAA 49 (L) 02/20/2020   GFRNONAA 42 (L) 02/20/2020     Hepatic Lab Results  Component Value Date   AST 24 02/20/2020   ALT 21 02/20/2020   ALBUMIN 3.3 (L) 02/20/2020   ALKPHOS 111 02/20/2020   HCVAB 0.2 09/25/2018   AMYLASE 28 08/01/2015   LIPASE 20 12/05/2019   AMMONIA 46 (H) 08/13/2019     Electrolytes Lab Results  Component Value Date   NA 135 02/20/2020   K 3.8 02/20/2020   CL 102 02/20/2020   CALCIUM 8.1 (L) 02/20/2020   MG 2.4 11/27/2019   PHOS 4.6 07/14/2019     Bone Lab Results  Component Value Date   VD25OH 4.7 (  L) 11/25/2018     Inflammation (CRP: Acute Phase) (ESR: Chronic Phase) Lab Results  Component Value Date   CRP 10.8 (H) 11/25/2018   ESRSEDRATE 79 (H) 11/25/2018   LATICACIDVEN 1.2 01/23/2020       Note: Above Lab results reviewed.  Imaging  CT ANGIO CHEST AORTA W/CM & OR WO/CM CLINICAL DATA:  Chest and back pain.  Concern for dissection.  EXAM: CT ANGIOGRAPHY CHEST WITH CONTRAST  TECHNIQUE: Multidetector CT imaging of the chest was performed using the standard protocol during bolus administration of intravenous contrast. Multiplanar CT image reconstructions and MIPs were obtained to evaluate the vascular anatomy.  CONTRAST:  59m OMNIPAQUE IOHEXOL 350 MG/ML SOLN  COMPARISON:  None.  FINDINGS: C  Cardiovascular: There is no evidence for thoracic aortic dissection. There is no evidence for thoracic aortic aneurysm. The heart size is normal. Atherosclerotic changes are noted of the thoracic aorta. There is no large  centrally located pulmonary embolism. Detection of smaller pulmonary emboli is limited by contrast timing.  Mediastinum/Nodes:  -- No mediastinal lymphadenopathy.  -- No hilar lymphadenopathy.  -- No axillary lymphadenopathy.  -- No supraclavicular lymphadenopathy.  -- Normal thyroid gland where visualized.  -  Unremarkable esophagus.  Lungs/Pleura: Airways are patent. No pleural effusion, lobar consolidation, pneumothorax or pulmonary infarction.  Upper Abdomen: There is cirrhosis with stigmata of portal hypertension. Again noted is atrophy of the right kidney with associated hydronephrosis.  Musculoskeletal: No chest wall abnormality. No bony spinal canal stenosis.  Review of the MIP images confirms the above findings.  IMPRESSION: 1. No acute findings in the chest. 2. Cirrhosis with stigmata of portal hypertension. 3. Atrophic right kidney with associated hydronephrosis, better appreciated on the patient's recent CT abdomen pelvis.  Aortic Atherosclerosis (ICD10-I70.0).  Electronically Signed   By: CConstance HolsterM.D.   On: 02/21/2020 01:42  Assessment  The primary encounter diagnosis was Chronic pain syndrome. Diagnoses of Chronic low back pain (Primary area of Pain) (Bilateral) (L>R) w/ sciatica (Bilateral), Chronic lower extremity pain (Secondary Area of Pain) (Bilateral) (L>R), and Pharmacologic therapy were also pertinent to this visit.  Plan of Care  Problem-specific:  No problem-specific Assessment & Plan notes found for this encounter.  Ms. BZOEI AMISONhas a current medication list which includes the following long-term medication(s): albuterol, cyclobenzaprine, levemir flextouch, nitroglycerin, sertraline, and [START ON 05/08/2020] morphine.  Pharmacotherapy (Medications Ordered): Meds ordered this encounter  Medications  . morphine (MS CONTIN) 15 MG 12 hr tablet    Sig: Take 1 tablet (15 mg total) by mouth every 12 (twelve) hours. Must last 30  days. Do not break tablet    Dispense:  60 tablet    Refill:  0    Chronic Pain: STOP Act (Not applicable) Fill 1 day early if closed on refill date. Do not fill until: 05/08/2020. To last until: 06/07/2020. Avoid benzodiazepines within 8 hours of opioids   Orders:  No orders of the defined types were placed in this encounter.  Follow-up plan:   Return in about 1 month (around 06/07/2020) for (20-min), (F2F), (Med Mgmt).      Interventional management options:  Considering:   NOTE: Eliquis ANTICOAGULATION (Stop: 3 days  Restart: 6 hrs)  Diagnostic bilateral celiac plexus block  Diagnostic bilateral lumbar facet block  Possible bilateral lumbar facet RFA  Diagnostic bilateral cervical facet block  Possible bilateral cervical facet RFA    PRN Procedures:   None at this time     Recent Visits Date  Type Provider Dept  04/06/20 Office Visit Milinda Pointer, MD [facility]  Showing recent visits within past 90 days and meeting all other requirements Today's Visits Date Type Provider Dept  05/04/20 Telemedicine Milinda Pointer, MD [facility]  Showing today's visits and meeting all other requirements Future Appointments No visits were found meeting these conditions. Showing future appointments within next 90 days and meeting all other requirements  I discussed the assessment and treatment plan with the patient. The patient was provided an opportunity to ask questions and all were answered. The patient agreed with the plan and demonstrated an understanding of the instructions.  Patient advised to call back or seek an in-person evaluation if the symptoms or condition worsens.  Duration of encounter: 22 minutes.  Note by: Gaspar Cola, MD Date: 05/04/2020; Time: 8:04 PM

## 2020-05-05 ENCOUNTER — Other Ambulatory Visit: Payer: Self-pay | Admitting: Primary Care

## 2020-05-05 DIAGNOSIS — E1165 Type 2 diabetes mellitus with hyperglycemia: Secondary | ICD-10-CM

## 2020-05-07 ENCOUNTER — Emergency Department: Payer: Medicare HMO

## 2020-05-07 ENCOUNTER — Other Ambulatory Visit: Payer: Self-pay

## 2020-05-07 ENCOUNTER — Encounter: Payer: Self-pay | Admitting: Emergency Medicine

## 2020-05-07 DIAGNOSIS — E039 Hypothyroidism, unspecified: Secondary | ICD-10-CM | POA: Insufficient documentation

## 2020-05-07 DIAGNOSIS — M7989 Other specified soft tissue disorders: Secondary | ICD-10-CM | POA: Diagnosis not present

## 2020-05-07 DIAGNOSIS — R531 Weakness: Secondary | ICD-10-CM | POA: Diagnosis not present

## 2020-05-07 DIAGNOSIS — Z8541 Personal history of malignant neoplasm of cervix uteri: Secondary | ICD-10-CM | POA: Insufficient documentation

## 2020-05-07 DIAGNOSIS — Y929 Unspecified place or not applicable: Secondary | ICD-10-CM | POA: Diagnosis not present

## 2020-05-07 DIAGNOSIS — M79605 Pain in left leg: Secondary | ICD-10-CM | POA: Diagnosis not present

## 2020-05-07 DIAGNOSIS — Y999 Unspecified external cause status: Secondary | ICD-10-CM | POA: Diagnosis not present

## 2020-05-07 DIAGNOSIS — S99922A Unspecified injury of left foot, initial encounter: Secondary | ICD-10-CM | POA: Diagnosis present

## 2020-05-07 DIAGNOSIS — Y9389 Activity, other specified: Secondary | ICD-10-CM | POA: Insufficient documentation

## 2020-05-07 DIAGNOSIS — I2510 Atherosclerotic heart disease of native coronary artery without angina pectoris: Secondary | ICD-10-CM | POA: Diagnosis not present

## 2020-05-07 DIAGNOSIS — Z79899 Other long term (current) drug therapy: Secondary | ICD-10-CM | POA: Diagnosis not present

## 2020-05-07 DIAGNOSIS — Z87891 Personal history of nicotine dependence: Secondary | ICD-10-CM | POA: Insufficient documentation

## 2020-05-07 DIAGNOSIS — R600 Localized edema: Secondary | ICD-10-CM | POA: Diagnosis not present

## 2020-05-07 DIAGNOSIS — X58XXXA Exposure to other specified factors, initial encounter: Secondary | ICD-10-CM | POA: Insufficient documentation

## 2020-05-07 DIAGNOSIS — E119 Type 2 diabetes mellitus without complications: Secondary | ICD-10-CM | POA: Insufficient documentation

## 2020-05-07 DIAGNOSIS — S92352A Displaced fracture of fifth metatarsal bone, left foot, initial encounter for closed fracture: Secondary | ICD-10-CM | POA: Diagnosis not present

## 2020-05-07 DIAGNOSIS — R52 Pain, unspecified: Secondary | ICD-10-CM | POA: Diagnosis not present

## 2020-05-07 NOTE — ED Triage Notes (Signed)
Patient with complaint of left foot and ankle pain with swelling times two days. Patient states that she does not recall injuring her ankle but states that she has memory problems.

## 2020-05-08 ENCOUNTER — Encounter: Payer: Self-pay | Admitting: Radiology

## 2020-05-08 ENCOUNTER — Emergency Department
Admission: EM | Admit: 2020-05-08 | Discharge: 2020-05-08 | Disposition: A | Discharge status: Home / Self Care | Payer: Medicare HMO | Attending: Emergency Medicine | Admitting: Emergency Medicine

## 2020-05-08 DIAGNOSIS — M79605 Pain in left leg: Secondary | ICD-10-CM | POA: Diagnosis not present

## 2020-05-08 DIAGNOSIS — S92352A Displaced fracture of fifth metatarsal bone, left foot, initial encounter for closed fracture: Secondary | ICD-10-CM | POA: Diagnosis not present

## 2020-05-08 DIAGNOSIS — M7989 Other specified soft tissue disorders: Secondary | ICD-10-CM | POA: Diagnosis not present

## 2020-05-08 DIAGNOSIS — R600 Localized edema: Secondary | ICD-10-CM | POA: Diagnosis not present

## 2020-05-08 MED ORDER — HYDROCODONE-ACETAMINOPHEN 7.5-325 MG/15ML PO SOLN: 10.0000 mL | Freq: Once | ORAL | Status: DC

## 2020-05-08 NOTE — ED Provider Notes (Signed)
[facility] Emergency Department Provider Note   ____________________________________________   First MD Initiated Contact with Patient 05/08/20 0304     (approximate)  I have reviewed the triage vital signs and the nursing notes.   HISTORY  Chief Complaint Foot Pain    HPI [name] is a 64 y.o. female who presents to the ED from home with a chief complaint of left foot pain and swelling.  Patient noted it 2 days ago.  Does not remember fall/injury/trauma but states that she has short-term memory problems.  Voices no other complaints or injuries.      Past Medical History:  Diagnosis Date  . Acute encephalopathy 05/22/2018  . Allergy   . Anginal pain ([place])   . Anxiety   . Ascites   . C. difficile colitis 07/10/2015  . Cancer (HCC)    HX OF CANCER OF UTERUS   . Cirrhosis of liver not due to alcohol ([place]) 2016  . Degenerative disk disease   . Diverticulitis   . Gastroparesis   . GERD (gastroesophageal reflux disease)   . History of hiatal hernia   . Hypertension   . Hypothyroid   . Hypothyroidism due to amiodarone   . Ileus ([place]) 08/01/2015  . Intussusception intestine ([place]) 05/2015  . Orthostatic hypotension   . PAF (paroxysmal atrial fibrillation) ([place]) 03/2015   a. new onset 03/2015 in setting of intractable N/V; b. on Eliquis 5 mg bid; c. CHADSVASc 4 (DM, TIA x 2, female)  . Pancreatitis   . Pneumonia 11/14/2015  . Right ureteral stone 07/14/2016  . Sepsis ([place]) 12/15/2017  . Sick sinus syndrome ([place])   . Stomach ulcer   . Stroke [facility])    with minimal left sided weakness  . Syncope 01/2015  . Syncope due to orthostatic hypotension 05/18/2015  . Tachyarrhythmia 01/10/2016  . TIA (transient ischemic attack) 02/2015  . Type 1 diabetes ([place])    on levemir  . UTERINE CANCER, HX OF 03/27/2007   Qualifier: Diagnosis of  By: Maxie Better FNP, Rosalita Levan   . UTI (urinary tract infection) 05/22/2018    Patient Active Problem List    Diagnosis Date Noted  . Sepsis ([place]) 11/23/2019  . Acute pyelonephritis 09/30/2019  . Acute pain of left foot 08/18/2019  . Type II diabetes mellitus with renal manifestations ([place]) 08/13/2019  . Weakness of both lower extremities 07/31/2019  . Altered mental status 07/14/2019  . GAD (generalized anxiety disorder) 06/11/2019  . Poor memory 06/11/2019  . Constipation 05/25/2019  . Acute medial meniscus tear of left knee 05/25/2019  . AKI (acute kidney injury) ([place]) 05/16/2019  . Closed fracture of left distal fibula 05/16/2019  . Sinus tachycardia 05/05/2019  . Congestive dilated cardiomyopathy ([place]) 04/14/2019  . CAD (coronary artery disease) 04/10/2019  . Closed fracture of lateral malleolus 04/10/2019  . Lumbar facet syndrome (Bilateral) (R>L) 03/09/2019  . Long term (current) use of anticoagulants 03/09/2019  . Unspecified cirrhosis of liver ([place]) 02/11/2019  . Hypoalbuminemia 02/11/2019  . Edema due to hypoalbuminemia 02/11/2019  . Lower extremity edema 01/26/2019  . Prolonged QT interval 12/17/2018  . Hypomagnesemia 12/17/2018  . Uncontrolled type 2 diabetes mellitus ([place]) 12/17/2018  . H/O TIA (transient ischemic attack) and stroke 12/17/2018  . Personal history of surgery to heart and great vessels, presenting hazards to health 12/17/2018  . DDD (degenerative disc disease), lumbar 12/17/2018  . Lumbar intervertebral disc displacement 12/17/2018  . Chronic musculoskeletal pain 12/17/2018  . Chronic generalized  pain 12/17/2018  . Hyponatremia 12/17/2018  . Fibromyalgia syndrome 12/17/2018  . Other intervertebral disc displacement, lumbar region 12/17/2018  . Vitamin D deficiency 11/26/2018  . Elevated C-reactive protein (CRP) 11/26/2018  . Elevated sed rate 11/26/2018  . Chronic low back pain (Primary area of Pain) (Bilateral) (L>R) w/ sciatica (Bilateral) 11/25/2018  . Chronic lower extremity pain (Secondary Area of Pain) (Bilateral) (L>R) 11/25/2018  . Chronic neck pain  11/25/2018  . Pharmacologic therapy 11/25/2018  . Disorder of skeletal system 11/25/2018  . Problems influencing health status 11/25/2018  . Renal mass 10/06/2018  . Chronic intermittent abdominal pain 09/25/2018  . Unspecified abdominal pain 09/25/2018  . Frequent falls 09/12/2018  . Orthostatic hypotension 08/18/2018  . Normochromic normocytic anemia 08/18/2018  . Acute renal failure superimposed on stage 3a chronic kidney disease ([place]) 08/18/2018  . History of hypothyroidism 08/14/2018  . Medical non-compliance 08/14/2018  . Diabetic ketoacidosis ([place]) 05/22/2018  . UTI (urinary tract infection) 05/22/2018  . NSTEMI (non-ST elevated myocardial infarction) ([place]) 01/11/2016  . Tachy-brady syndrome ([place]) 12/28/2015  . PAF (paroxysmal atrial fibrillation) ([place]) 11/24/2015  . Type 2 diabetes mellitus with neurologic complication ([place]) 05/69/7948  . S/P cholecystectomy 11/23/2015  . Narcotic abuse ([place]) 11/22/2015  . Chronic superficial gastritis without bleeding 11/22/2015  . History of Clostridium difficile 11/22/2015  . History of TIA (transient ischemic attack) 11/22/2015  . Mixed hyperlipidemia 11/22/2015  . Diverticulosis 11/22/2015  . History of uterine cancer 11/22/2015  . Hypothyroidism, unspecified 11/22/2015  . Intractable cyclical vomiting with nausea 11/22/2015  . Narcotic withdrawal ([place]) 11/11/2015  . Hyperlipidemia 08/31/2015  . Hypotension 08/06/2015  . Cryptogenic cirrhosis ([place]) 07/10/2015  . Atrial fibrillation ([place]) 07/10/2015  . Malnutrition of moderate degree 07/04/2015  . Symptomatic bradycardia 05/27/2015  . Chronic anemia 05/19/2015  . Thrombocytopenia ([place]) 05/19/2015  . Hypokalemia 04/06/2015  . Hyperlipidemia with target LDL less than 100 04/06/2015  . Syncope 03/11/2015  . CVA (cerebral vascular accident) ([place]) 02/15/2015  . Essential hypertension 01/12/2015  . Chronically on opiate therapy 01/12/2015  . Cirrhosis of liver not due to alcohol  ([place]) 2016  . S/P Nissen fundoplication (without gastrostomy tube) procedure 05/11/2014  . Dysphagia 04/19/2014  . DEPRESSION/ANXIETY 06/27/2007  . Myofascial pain syndrome 06/27/2007  . Chronic pain syndrome 03/28/2007  . GERD 03/27/2007  . Diverticulosis of large intestine without perforation or abscess without bleeding 03/27/2007  . Proteinuria 03/27/2007    Past Surgical History:  Procedure Laterality Date  . ABDOMINAL HYSTERECTOMY    . CARDIAC CATHETERIZATION N/A 01/12/2016   Procedure: Left Heart Cath and Coronary Angiography;  Surgeon: Wellington Hampshire, MD;  Location: [facility];  Service: Cardiovascular;  Laterality: N/A;  . CHOLECYSTECTOMY    . CYSTOSCOPY W/ RETROGRADES Bilateral 12/11/2019   Procedure: CYSTOSCOPY WITH RETROGRADE PYELOGRAM;  Surgeon: Billey Co, MD;  Location: [facility];  Service: Urology;  Laterality: Bilateral;  . CYSTOSCOPY WITH BIOPSY N/A 12/11/2019   Procedure: CYSTOSCOPY WITH BLADDER BIOPSY;  Surgeon: Billey Co, MD;  Location: [facility];  Service: Urology;  Laterality: N/A;  . CYSTOSCOPY WITH FULGERATION N/A 12/11/2019   Procedure: CYSTOSCOPY WITH FULGERATION;  Surgeon: Billey Co, MD;  Location: [facility];  Service: Urology;  Laterality: N/A;  . CYSTOSCOPY WITH STENT PLACEMENT Right 12/11/2019   Procedure: CYSTOSCOPY WITH STENT PLACEMENT;  Surgeon: Billey Co, MD;  Location: [facility];  Service: Urology;  Laterality: Right;  . CYSTOSCOPY WITH URETHRAL DILATATION Right 12/11/2019   Procedure: CYSTOSCOPY WITH  URETERAL DILATATION;  Surgeon: Billey Co, MD;  Location: [facility];  Service: Urology;  Laterality: Right;  . CYSTOSCOPY/URETEROSCOPY/HOLMIUM LASER Right 07/14/2016   Procedure: CYSTOSCOPY/URETEROSCOPY/HOLMIUM LASER;  Surgeon: Alexis Frock, MD;  Location: [facility];  Service: Urology;  Laterality: Right;  . ESOPHAGOGASTRODUODENOSCOPY N/A 04/04/2015   Procedure: ESOPHAGOGASTRODUODENOSCOPY (EGD);  Surgeon: Hulen Luster, MD;   Location: [facility];  Service: Endoscopy;  Laterality: N/A;  . ESOPHAGOGASTRODUODENOSCOPY N/A 12/28/2017   Procedure: ESOPHAGOGASTRODUODENOSCOPY (EGD);  Surgeon: Lin Landsman, MD;  Location: [facility];  Service: Gastroenterology;  Laterality: N/A;  . ESOPHAGOGASTRODUODENOSCOPY (EGD) WITH PROPOFOL N/A 01/18/2016   Procedure: ESOPHAGOGASTRODUODENOSCOPY (EGD) WITH PROPOFOL;  Surgeon: Lucilla Lame, MD;  Location: [facility] ENDOS[facility];  Service: Endoscopy;  Laterality: N/A;  . FLEXIBLE SIGMOIDOSCOPY N/A 01/18/2016   Procedure: FLEXIBLE SIGMOIDOSCOPY;  Surgeon: Lucilla Lame, MD;  Location: [facility] ENDOS[facility];  Service: Endoscopy;  Laterality: N/A;  . HERNIA REPAIR    . URETEROSCOPY Right 12/11/2019   Procedure: Christen Butter;  Surgeon: Billey Co, MD;  Location: [facility];  Service: Urology;  Laterality: Right;    Prior to Admission medications   Medication Sig Start Date End Date Taking? Authorizing Provider  albuterol (PROVENTIL HFA;VENTOLIN HFA) 108 (90 Base) MCG/ACT inhaler Inhale 2 puffs into the lungs every 6 (six) hours as needed for wheezing or shortness of breath. 11/27/18   Epifanio Lesches, MD  atorvastatin (LIPITOR) 40 MG tablet Take 40 mg by mouth at bedtime.     [provider]  cholecalciferol (VITAMIN D3) 25 MCG (1000 UNIT) tablet Take 1,000 Units by mouth daily.    [provider]  cyclobenzaprine (FLEXERIL) 10 MG tablet Take 1 tablet (10 mg total) by mouth 3 (three) times daily as needed for muscle spasms. Must last 30 days. 04/08/20 07/07/20  Milinda Pointer, MD  glucose blood (ACCU-CHEK AVIVA PLUS) test strip Use as instructed to test blood sugar up to 4 times daily 10/09/19   [name], NP  LEVEMIR FLEXTOUCH 100 UNIT/ML FlexPen INJECT 30 UNITS SUBCUTANEOSLY EVERY MORNING AND AT BEDTIME 05/05/20   [name], NP  magnesium gluconate (MAGONATE) 500 MG tablet Take 500 mg by mouth 2 (two) times daily.    [provider]    metoprolol tartrate (LOPRESSOR) 25 MG tablet Take 25 mg by mouth 2 (two) times daily. 04/08/20   [provider]  morphine (MS CONTIN) 15 MG 12 hr tablet Take 1 tablet (15 mg total) by mouth every 12 (twelve) hours. Must last 30 days. Do not break tablet 05/08/20 06/07/20  Milinda Pointer, MD  nitroGLYCERIN (NITROSTAT) 0.3 MG SL tablet Place 1 tablet (0.3 mg total) under the tongue every 5 (five) minutes as needed for chest pain. 07/24/19 07/23/20  Nena Polio, MD  sertraline (ZOLOFT) 50 MG tablet TAKE ONE TABLET EVERY DAY 02/05/20   [name], NP    Allergies Aspirin, Codeine sulfate, Erythromycin, Prednisone, Rosiglitazone maleate, and Tetanus-diphtheria toxoids td  Family History  Problem Relation Age of Onset  . Hypertension Mother   . CAD Sister   . Heart attack Sister        Deceased 12/01/14  . CAD Brother     Social History Social History   Tobacco Use  . Smoking status: Former Smoker    Types: Cigarettes  . Smokeless tobacco: Never Used  . Tobacco comment: 25 years ago and only smoked occasionally  Vaping Use  . Vaping Use: Never used  Substance Use Topics  . Alcohol use: No  .  Drug use: No    Review of Systems  Constitutional: No fever/chills Eyes: No visual changes. ENT: No sore throat. Cardiovascular: Denies chest pain. Respiratory: Denies shortness of breath. Gastrointestinal: No abdominal pain.  No nausea, no vomiting.  No diarrhea.  No constipation. Genitourinary: Negative for dysuria. Musculoskeletal: Positive for left foot pain.  Negative for back pain. Skin: Negative for rash. Neurological: Negative for headaches, focal weakness or numbness.   ____________________________________________   PHYSICAL EXAM:  VITAL SIGNS: ED Triage Vitals  Enc Vitals Group     BP 05/07/20 2334 (!) 122/97     Pulse Rate 05/07/20 2334 (!) 107     Resp 05/07/20 2334 18     Temp 05/07/20 2334 98.2 F (36.8 C)     Temp Source 05/07/20 2334 Oral      SpO2 05/07/20 2334 100 %     Weight 05/07/20 2335 140 lb (63.5 kg)     Height 05/07/20 2335 5' 3"  (1.6 m)     Head Circumference --      Peak Flow --      Pain Score 05/07/20 2334 10     Pain Loc --      Pain Edu? --      Excl. in [place]? --     Constitutional: Alert and oriented.  Appears older than stated age and in no acute distress. Eyes: Conjunctivae are normal. PERRL. EOMI. Head: Atraumatic. Nose: No congestion/rhinnorhea. Mouth/Throat: Mucous membranes are moist.   Neck: No stridor.   Cardiovascular: Normal rate, regular rhythm. Grossly normal heart sounds.  Good peripheral circulation. Respiratory: Normal respiratory effort.  No retractions. Lungs CTAB. Gastrointestinal: Soft and nontender. No distention. No abdominal bruits. No CVA tenderness. Musculoskeletal:  Left foot: Mild swelling to lateral foot.  Limited range of motion secondary to pain.  2+ distal pulses.  Brisk, less than 5-second capillary refill. Neurologic:  Normal speech and language. No gross focal neurologic deficits are appreciated.  Skin:  Skin is warm, dry and intact. No rash noted. Psychiatric: Mood and affect are normal. Speech and behavior are normal.  ____________________________________________   LABS (all labs ordered are listed, but only abnormal results are displayed)  Labs Reviewed - No data to display ____________________________________________  EKG  None ____________________________________________  RADIOLOGY  ED MD interpretation: Left ankle/foot x-rays demonstrate mildly displaced oblique fifth metatarsal fracture  Official radiology report(s): DG Ankle Complete Left  Result Date: 05/08/2020 CLINICAL DATA:  64 year old female with left lower extremity pain and swelling. EXAM: LEFT FOOT - COMPLETE 3+ VIEW; LEFT ANKLE COMPLETE - 3+ VIEW COMPARISON:  None. FINDINGS: There is a mildly displaced oblique fracture of the fifth metatarsal. No other acute fracture. The bones are  osteopenic. There is no dislocation. The ankle mortise is intact. There is diffuse subcutaneous edema. IMPRESSION: Mildly displaced oblique fracture of the fifth metatarsal. Electronically Signed   By: Anner Crete M.D.   On: 05/08/2020 00:16   DG Foot Complete Left  Result Date: 05/08/2020 CLINICAL DATA:  64 year old female with left lower extremity pain and swelling. EXAM: LEFT FOOT - COMPLETE 3+ VIEW; LEFT ANKLE COMPLETE - 3+ VIEW COMPARISON:  None. FINDINGS: There is a mildly displaced oblique fracture of the fifth metatarsal. No other acute fracture. The bones are osteopenic. There is no dislocation. The ankle mortise is intact. There is diffuse subcutaneous edema. IMPRESSION: Mildly displaced oblique fracture of the fifth metatarsal. Electronically Signed   By: Anner Crete M.D.   On: 05/08/2020 00:16    ____________________________________________  PROCEDURES  Procedure(s) performed (including Critical Care):  Procedures   ____________________________________________   INITIAL IMPRESSION / ASSESSMENT AND PLAN / ED COURSE  As part of my medical decision making, I reviewed the following data within the [place] notes reviewed and incorporated, Radiograph reviewed, Notes from prior ED visits and [place] Controlled Substance [place] was evaluated in Emergency Department on 05/08/2020 for the symptoms described in the history of present illness. She was evaluated in the context of the global COVID-19 pandemic, which necessitated consideration that the patient might be at risk for infection with the SARS-CoV-2 virus that causes COVID-19. Institutional protocols and algorithms that pertain to the evaluation of patients at risk for COVID-19 are in a state of rapid change based on information released by regulatory bodies including the CDC and federal and state organizations. These policies and algorithms were followed during the patient's  care in the ED.    63 year old female presenting with left foot pain and swelling.  X-ray demonstrates fifth metatarsal fracture.  Will place in posterior leg splint.  Patient has walker at home and was encouraged to use it.  Review of [place] narcotics database demonstrate patient receives morphine and oxycodone tablets regularly.  She was encouraged to take her pain medicine as directed.  Patient will follow up with podiatry.  Strict return precautions given.  Patient verbalizes understanding agrees with plan of care.      ____________________________________________   FINAL CLINICAL IMPRESSION(S) / ED DIAGNOSES  Final diagnoses:  Closed displaced fracture of fifth metatarsal bone of left foot, initial encounter     ED Discharge Orders    None       Note:  This document was prepared using Dragon voice recognition software and may include unintentional dictation errors.   Paulette Blanch, MD 05/08/20 [phone]

## 2020-05-08 NOTE — Discharge Instructions (Signed)
1.  Keep splint clean and dry.  Elevate affected area several times daily with ice over splint.  Use your walker to help you balance as you walk. 2.  You may take your pain medicines as needed. 3.  Return to the ER for worsening symptoms, persistent vomiting, difficulty breathing or other concerns.

## 2020-06-01 ENCOUNTER — Other Ambulatory Visit: Payer: Self-pay | Admitting: Primary Care

## 2020-06-01 ENCOUNTER — Other Ambulatory Visit: Payer: Self-pay | Admitting: Pain Medicine

## 2020-06-01 DIAGNOSIS — F411 Generalized anxiety disorder: Secondary | ICD-10-CM

## 2020-06-01 DIAGNOSIS — G894 Chronic pain syndrome: Secondary | ICD-10-CM

## 2020-06-01 DIAGNOSIS — E1165 Type 2 diabetes mellitus with hyperglycemia: Secondary | ICD-10-CM

## 2020-06-07 ENCOUNTER — Ambulatory Visit: Payer: Medicare Other | Attending: Anesthesiology | Admitting: Anesthesiology

## 2020-06-07 ENCOUNTER — Encounter: Payer: Self-pay | Admitting: Anesthesiology

## 2020-06-07 ENCOUNTER — Other Ambulatory Visit: Payer: Self-pay

## 2020-06-07 DIAGNOSIS — M79604 Pain in right leg: Secondary | ICD-10-CM

## 2020-06-07 DIAGNOSIS — M79605 Pain in left leg: Secondary | ICD-10-CM

## 2020-06-07 DIAGNOSIS — G894 Chronic pain syndrome: Secondary | ICD-10-CM

## 2020-06-07 DIAGNOSIS — M797 Fibromyalgia: Secondary | ICD-10-CM

## 2020-06-07 DIAGNOSIS — M5442 Lumbago with sciatica, left side: Secondary | ICD-10-CM | POA: Diagnosis not present

## 2020-06-07 DIAGNOSIS — M5441 Lumbago with sciatica, right side: Secondary | ICD-10-CM

## 2020-06-07 DIAGNOSIS — G8929 Other chronic pain: Secondary | ICD-10-CM

## 2020-06-07 DIAGNOSIS — M47816 Spondylosis without myelopathy or radiculopathy, lumbar region: Secondary | ICD-10-CM

## 2020-06-07 DIAGNOSIS — Z79899 Other long term (current) drug therapy: Secondary | ICD-10-CM

## 2020-06-07 DIAGNOSIS — M7918 Myalgia, other site: Secondary | ICD-10-CM

## 2020-06-07 MED ORDER — OXYCODONE HCL 5 MG PO TABS: 5.0000 mg | ORAL_TABLET | ORAL | 0 refills | Status: DC | PRN

## 2020-06-07 NOTE — Progress Notes (Signed)
Virtual Visit via Telephone Note  I connected with [name] on 06/07/20 at 11:00 AM EDT by telephone and verified that I am speaking with the correct person using two identifiers.  Location: Patient: Home Provider: Pain control center   I discussed the limitations, risks, security and privacy concerns of performing an evaluation and management service by telephone and the availability of in person appointments. I also discussed with the patient that there may be a patient responsible charge related to this service. The patient expressed understanding and agreed to proceed.   History of Present Illness: I spoke with [name] via telephone as we were unable to link up for the video portion her virtual conference.  She reports that her recent change to morphine sulfate has caused her to have significant nausea and nightmares.  She felt like she responded much better to the oxycodone tablets.  The quality characteristic and distribution of pain otherwise has been unchanged.  No changes in motor function or lower extremity strength or function or distribution of pain are noted on today's questioning and evaluation.    Observations/Objective:  Current Outpatient Medications:  .  albuterol (PROVENTIL HFA;VENTOLIN HFA) 108 (90 Base) MCG/ACT inhaler, Inhale 2 puffs into the lungs every 6 (six) hours as needed for wheezing or shortness of breath., Disp: 1 Inhaler, Rfl: 2 .  atorvastatin (LIPITOR) 40 MG tablet, Take 40 mg by mouth at bedtime. , Disp: , Rfl:  .  cholecalciferol (VITAMIN D3) 25 MCG (1000 UNIT) tablet, Take 1,000 Units by mouth daily., Disp: , Rfl:  .  cyclobenzaprine (FLEXERIL) 10 MG tablet, Take 1 tablet (10 mg total) by mouth 3 (three) times daily as needed for muscle spasms. Must last 30 days., Disp: 90 tablet, Rfl: 2 .  glucose blood (ACCU-CHEK AVIVA PLUS) test strip, Use as instructed to test blood sugar up to 4 times daily, Disp: 300 each, Rfl: 2 .  LEVEMIR FLEXTOUCH  100 UNIT/ML FlexPen, INJECT 30 UNITS SUBCUTANEOSLY EVERY MORNING AND AT BEDTIME, Disp: 15 mL, Rfl: 0 .  magnesium gluconate (MAGONATE) 500 MG tablet, Take 500 mg by mouth 2 (two) times daily., Disp: , Rfl:  .  metoprolol tartrate (LOPRESSOR) 25 MG tablet, Take 25 mg by mouth 2 (two) times daily., Disp: , Rfl:  .  morphine (MS CONTIN) 15 MG 12 hr tablet, Take 1 tablet (15 mg total) by mouth every 12 (twelve) hours. Must last 30 days. Do not break tablet, Disp: 60 tablet, Rfl: 0 .  nitroGLYCERIN (NITROSTAT) 0.3 MG SL tablet, Place 1 tablet (0.3 mg total) under the tongue every 5 (five) minutes as needed for chest pain., Disp: 100 tablet, Rfl: 0 .  oxyCODONE (ROXICODONE) 5 MG immediate release tablet, Take 1 tablet (5 mg total) by mouth every 4 (four) hours as needed for severe pain., Disp: 180 tablet, Rfl: 0 .  sertraline (ZOLOFT) 50 MG tablet, TAKE ONE TABLET EVERY DAY, Disp: 90 tablet, Rfl: 1  Assessment and Plan: 1. Chronic pain syndrome   2. Pharmacologic therapy   3. Chronic low back pain (Primary area of Pain) (Bilateral) (L>R) w/ sciatica (Bilateral)   4. Lumbar facet syndrome (Bilateral) (R>L)   5. Fibromyalgia syndrome   6. Chronic lower extremity pain (Secondary Area of Pain) (Bilateral) (L>R)   7. Myofascial pain syndrome   Based on our discussion today and upon review of the [facility] practitioner database information I am going to change her back to the oxycodone 5 mg tablets to be  taken every 4-6 hours.  She is to discontinue morphine.  She understands the risks and benefits of chronic opioid maintenance therapy as reviewed previously.  No other questions are brought forward today.  No other changes in her pain management protocol are initiated today.  I encouraged her to follow-up with her primary care physicians for baseline medical care and we will schedule her to be followed by Dr. Dossie Arbour in 1 month for further evaluation.  Follow Up Instructions:    I discussed the  assessment and treatment plan with the patient. The patient was provided an opportunity to ask questions and all were answered. The patient agreed with the plan and demonstrated an understanding of the instructions.   The patient was advised to call back or seek an in-person evaluation if the symptoms worsen or if the condition fails to improve as anticipated.  I provided 25 minutes of non-face-to-face time during this encounter.   Molli Barrows, MD

## 2020-06-20 ENCOUNTER — Encounter: Payer: Medicare HMO | Admitting: Anesthesiology

## 2020-06-28 ENCOUNTER — Telehealth: Payer: Self-pay

## 2020-06-28 NOTE — Telephone Encounter (Signed)
Attempted to call patient for pre virtual appointment questions.  Left message for her to call us as soon as possible.

## 2020-06-28 NOTE — Progress Notes (Signed)
Patient: [name]  Service Category: E/M  Provider: Gaspar Cola, MD  DOB: 06-21-1956  DOS: 06/29/2020  Location: Office  MRN: [id number]  Setting: Ambulatory outpatient  Referring Provider: [name], NP  Type: Established Patient  Specialty: Interventional Pain Management  PCP: [name], NP  Location: Remote location  Delivery: TeleHealth     Virtual Encounter - Pain Management PROVIDER NOTE: Information contained herein reflects review and annotations entered in association with encounter. Interpretation of such information and data should be left to medically-trained personnel. Information provided to patient can be located elsewhere in the medical record under "Patient Instructions". Document created using STT-dictation technology, any transcriptional errors that may result from process are unintentional.    Contact & Pharmacy Preferred: [phone] Home: [phone] (home) Mobile: [phone] (mobile) E-mail: No e-mail address on record  [place], [place] - [address] [place] [address] Phone: [phone] Fax: [phone]   Pre-screening  [name] offered "in-person" vs "virtual" encounter. She indicated preferring virtual for this encounter.   Reason COVID-19*  Social distancing based on CDC and AMA recommendations.   I contacted [name] on 06/29/2020 via telephone.      I clearly identified myself as Gaspar Cola, MD. I verified that I was speaking with the correct person using two identifiers (Name: [name], and date of birth: 09-09-1956).  Consent I sought verbal advanced consent from [name] for virtual visit interactions. I informed [name] of possible security and privacy concerns, risks, and limitations associated with providing "not-in-person" medical evaluation and management services. I also informed [name] of the availability of "in-person" appointments.  Finally, I informed her that there would be a charge for the virtual visit and that she could be  personally, fully or partially, financially responsible for it. [name] expressed understanding and agreed to proceed.   Historic Elements   [name] is a 64 y.o. year old, female patient evaluated today after our last contact on 06/01/2020. [name]  has a past medical history of Acute encephalopathy (05/22/2018), Allergy, Anginal pain ([place]), Anxiety, Ascites, C. difficile colitis (07/10/2015), Cancer ([place]), Cirrhosis of liver not due to alcohol ([place]) (2016), Degenerative disk disease, Diverticulitis, Gastroparesis, GERD (gastroesophageal reflux disease), History of hiatal hernia, Hypertension, Hypothyroid, Hypothyroidism due to amiodarone, Ileus ([place]) (08/01/2015), Intussusception intestine ([place]) (05/2015), Orthostatic hypotension, PAF (paroxysmal atrial fibrillation) ([place]) (03/2015), Pancreatitis, Pneumonia (11/14/2015), Right ureteral stone (07/14/2016), Sepsis ([place]) (12/15/2017), Sick sinus syndrome ([place]), Stomach ulcer, Stroke ([place]), Syncope (01/2015), Syncope due to orthostatic hypotension (05/18/2015), Tachyarrhythmia (01/10/2016), TIA (transient ischemic attack) (02/2015), Type 1 diabetes ([place]), UTERINE CANCER, HX OF (03/27/2007), and UTI (urinary tract infection) (05/22/2018). She also  has a past surgical history that includes Hernia repair; Abdominal hysterectomy; Cholecystectomy; Esophagogastroduodenoscopy (N/A, 04/04/2015); Cardiac catheterization (N/A, 01/12/2016); Esophagogastroduodenoscopy (egd) with propofol (N/A, 01/18/2016); Flexible sigmoidoscopy (N/A, 01/18/2016); Cystoscopy/ureteroscopy/holmium laser (Right, 07/14/2016); Esophagogastroduodenoscopy (N/A, 12/28/2017); Cystoscopy with biopsy (N/A, 12/11/2019); Cystoscopy with fulgeration (N/A, 12/11/2019); Cystoscopy w/ retrogrades (Bilateral, 12/11/2019); Ureteroscopy (Right, 12/11/2019); Cystoscopy with stent placement (Right, 12/11/2019); and Cystoscopy with  urethral dilatation (Right, 12/11/2019). [name] has a current medication list which includes the following prescription(s): albuterol, atorvastatin, cholecalciferol, accu-chek aviva plus, levemir flextouch, magnesium gluconate, metoprolol tartrate, nitroglycerin, [START ON 07/07/2020] oxycodone, sertraline, and [START ON 07/07/2020] cyclobenzaprine. She  reports that she has quit smoking. Her smoking use included cigarettes. She has never used smokeless tobacco. She reports that she  does not drink alcohol and does not use drugs. [name] is allergic to aspirin, codeine sulfate, erythromycin, prednisone, rosiglitazone maleate, and tetanus-diphtheria toxoids td.   HPI  Today, she is being contacted for medication management.  Patient changed appointment from face-to-face to virtual visit secondary to recent exposure to COVID-19.  Today: The patient at 9:37 AM and she seemed to be very sleepy.  She also indicated that she was unable to tolerate the morphine that I put her on.  The main problem was that it caused her to have some very vivid and severe nightmares.  While I was out on vacation she was put back on oxycodone by Dr. Vashti Hey, one of my partners.  Today I will renew the oxycodone.  I will also be transferring the nonopioid prescriptions to the PCP.  (Flexeril) she refers that on the oxycodone she is having no problems.  Pharmacotherapy Assessment  Analgesic: Oxycodone IR 5 mg, 1 tab PO q 4 hrs (30 mg/day of oxycodone). Possibly High Risk. (See 04/06/20 note) MAX. MME: 200 mg/day MME/day: 45 mg/day.   Monitoring: [place] PMP: PDMP reviewed during this encounter.       Pharmacotherapy: No side-effects or adverse reactions reported. Compliance: No problems identified. Effectiveness: Clinically acceptable. Plan: Refer to "POC".  UDS:  Summary  Date Value Ref Range Status  04/06/2020 Note  Final    Comment:    ==================================================================== [employer]  Select 13 (MW) ==================================================================== Test                             Result       Flag       Units  Drug Absent but Declared for Prescription Verification   Oxycodone                      Not Detected UNEXPECTED ng/mg creat ==================================================================== Test                      Result    Flag   Units      Ref Range   Creatinine              39               mg/dL      >=20 ==================================================================== Declared Medications:  The flagging and interpretation on this report are based on the  following declared medications.  Unexpected results may arise from  inaccuracies in the declared medications.   **Note: The testing scope of this panel includes these medications:   Oxycodone (Roxicodone)   **Note: The testing scope of this panel does not include the  following reported medications:   Albuterol (Ventolin HFA)  Atorvastatin (Lipitor)  Cephalexin (Keflex)  Cyclobenzaprine (Flexeril)  Insulin (Levemir)  Magnesium (Magonate)  Nitroglycerin (Nitrostat)  Sertraline (Zoloft)  Vitamin D3 ==================================================================== For clinical consultation, please call [phone]. ====================================================================     Laboratory Chemistry Profile   Renal Lab Results  Component Value Date   BUN 11 02/20/2020   CREATININE 1.31 (H) 02/20/2020   GFR 42.53 (L) 08/18/2019   GFRAA 49 (L) 02/20/2020   GFRNONAA 42 (L) 02/20/2020     Hepatic Lab Results  Component Value Date   AST 24 02/20/2020   ALT 21 02/20/2020   ALBUMIN 3.3 (L) 02/20/2020   ALKPHOS 111 02/20/2020   HCVAB 0.2 09/25/2018   AMYLASE 28 08/01/2015   LIPASE 20 12/05/2019  AMMONIA 46 (H) 08/13/2019     Electrolytes Lab Results  Component Value Date   NA 135 02/20/2020   K 3.8 02/20/2020   CL 102 02/20/2020    CALCIUM 8.1 (L) 02/20/2020   MG 2.4 11/27/2019   PHOS 4.6 07/14/2019     Bone Lab Results  Component Value Date   VD25OH 4.7 (L) 11/25/2018     Inflammation (CRP: Acute Phase) (ESR: Chronic Phase) Lab Results  Component Value Date   CRP 10.8 (H) 11/25/2018   ESRSEDRATE 79 (H) 11/25/2018   LATICACIDVEN 1.2 01/23/2020       Note: Above Lab results reviewed.  Imaging  DG Ankle Complete Left CLINICAL DATA:  63 year old female with left lower extremity pain and swelling.  EXAM: LEFT FOOT - COMPLETE 3+ VIEW; LEFT ANKLE COMPLETE - 3+ VIEW  COMPARISON:  None.  FINDINGS: There is a mildly displaced oblique fracture of the fifth metatarsal. No other acute fracture. The bones are osteopenic. There is no dislocation. The ankle mortise is intact. There is diffuse subcutaneous edema.  IMPRESSION: Mildly displaced oblique fracture of the fifth metatarsal.  Electronically Signed   By: Anner Crete M.D.   On: 05/08/2020 00:16 DG Foot Complete Left CLINICAL DATA:  64 year old female with left lower extremity pain and swelling.  EXAM: LEFT FOOT - COMPLETE 3+ VIEW; LEFT ANKLE COMPLETE - 3+ VIEW  COMPARISON:  None.  FINDINGS: There is a mildly displaced oblique fracture of the fifth metatarsal. No other acute fracture. The bones are osteopenic. There is no dislocation. The ankle mortise is intact. There is diffuse subcutaneous edema.  IMPRESSION: Mildly displaced oblique fracture of the fifth metatarsal.  Electronically Signed   By: Anner Crete M.D.   On: 05/08/2020 00:16  Assessment  The primary encounter diagnosis was Chronic pain syndrome. Diagnoses of Chronic low back pain (Primary area of Pain) (Bilateral) (L>R) w/ sciatica (Bilateral), Chronic lower extremity pain (Secondary Area of Pain) (Bilateral) (L>R), Pharmacologic therapy, Chronic musculoskeletal pain, Myofascial pain syndrome, and Fibromyalgia syndrome were also pertinent to this visit.  Plan of  Care  Problem-specific:  No problem-specific Assessment & Plan notes found for this encounter.  [name] has a current medication list which includes the following long-term medication(s): albuterol, levemir flextouch, nitroglycerin, [START ON 07/07/2020] oxycodone, sertraline, and [START ON 07/07/2020] cyclobenzaprine.  Pharmacotherapy (Medications Ordered): Meds ordered this encounter  Medications  . cyclobenzaprine (FLEXERIL) 10 MG tablet    Sig: Take 1 tablet (10 mg total) by mouth 3 (three) times daily as needed for muscle spasms. Must last 30 days.    Dispense:  90 tablet    Refill:  2    Fill one day early if pharmacy is closed on scheduled refill date. May substitute for generic if available.  Marland Kitchen oxyCODONE (ROXICODONE) 5 MG immediate release tablet    Sig: Take 1 tablet (5 mg total) by mouth every 4 (four) hours as needed for severe pain.    Dispense:  180 tablet    Refill:  0    Chronic Pain: STOP Act (Not applicable) Fill 1 day early if closed on refill date. Avoid benzodiazepines within 8 hours of opioids   Orders:  No orders of the defined types were placed in this encounter.  Follow-up plan:   Return in about 5 weeks (around 08/06/2020) for (F2F), (Med Mgmt).      Interventional management options:  Considering:   NOTE: Eliquis ANTICOAGULATION (Stop: 3 days  Restart: 6 hrs)  Diagnostic  bilateral celiac plexus block  Diagnostic bilateral lumbar facet block  Possible bilateral lumbar facet RFA  Diagnostic bilateral cervical facet block  Possible bilateral cervical facet RFA    PRN Procedures:   None at this time      Recent Visits Date Type Provider Dept  06/07/20 Office Visit Molli Barrows, MD [facility]  05/04/20 Telemedicine Milinda Pointer, MD [facility]  04/06/20 Office Visit Milinda Pointer, MD [facility]  Showing recent visits within past 90 days and meeting all other requirements Today's Visits Date  Type Provider Dept  06/29/20 Telemedicine Milinda Pointer, MD [facility]  Showing today's visits and meeting all other requirements Future Appointments Date Type Provider Dept  07/13/20 Appointment Milinda Pointer, MD [facility]  Showing future appointments within next 90 days and meeting all other requirements  I discussed the assessment and treatment plan with the patient. The patient was provided an opportunity to ask questions and all were answered. The patient agreed with the plan and demonstrated an understanding of the instructions.  Patient advised to call back or seek an in-person evaluation if the symptoms or condition worsens.  Duration of encounter: 13 minutes.  Note by: Gaspar Cola, MD Date: 06/29/2020; Time: 9:41 AM

## 2020-06-28 NOTE — Telephone Encounter (Signed)
Attempted to call patient, line busy.

## 2020-06-29 ENCOUNTER — Ambulatory Visit: Payer: Medicare HMO | Attending: Pain Medicine | Admitting: Pain Medicine

## 2020-06-29 ENCOUNTER — Encounter: Payer: Self-pay | Admitting: Pain Medicine

## 2020-06-29 ENCOUNTER — Other Ambulatory Visit: Payer: Self-pay

## 2020-06-29 DIAGNOSIS — Z79899 Other long term (current) drug therapy: Secondary | ICD-10-CM | POA: Diagnosis not present

## 2020-06-29 DIAGNOSIS — M5441 Lumbago with sciatica, right side: Secondary | ICD-10-CM

## 2020-06-29 DIAGNOSIS — M79604 Pain in right leg: Secondary | ICD-10-CM | POA: Diagnosis not present

## 2020-06-29 DIAGNOSIS — G894 Chronic pain syndrome: Secondary | ICD-10-CM

## 2020-06-29 DIAGNOSIS — M7918 Myalgia, other site: Secondary | ICD-10-CM | POA: Diagnosis not present

## 2020-06-29 DIAGNOSIS — M79605 Pain in left leg: Secondary | ICD-10-CM

## 2020-06-29 DIAGNOSIS — G8929 Other chronic pain: Secondary | ICD-10-CM | POA: Diagnosis not present

## 2020-06-29 DIAGNOSIS — M797 Fibromyalgia: Secondary | ICD-10-CM | POA: Diagnosis not present

## 2020-06-29 DIAGNOSIS — M5442 Lumbago with sciatica, left side: Secondary | ICD-10-CM | POA: Diagnosis not present

## 2020-06-29 MED ORDER — OXYCODONE HCL 5 MG PO TABS: 5.0000 mg | ORAL_TABLET | ORAL | 0 refills | Status: DC | PRN

## 2020-06-29 MED ORDER — CYCLOBENZAPRINE HCL 10 MG PO TABS: 10.0000 mg | ORAL_TABLET | Freq: Three times a day (TID) | ORAL | 2 refills | Status: DC | PRN

## 2020-06-30 ENCOUNTER — Other Ambulatory Visit: Payer: Self-pay | Admitting: Primary Care

## 2020-06-30 ENCOUNTER — Other Ambulatory Visit: Payer: Self-pay

## 2020-06-30 DIAGNOSIS — E1165 Type 2 diabetes mellitus with hyperglycemia: Secondary | ICD-10-CM

## 2020-06-30 MED ORDER — LEVEMIR FLEXTOUCH 100 UNIT/ML ~~LOC~~ SOPN: PEN_INJECTOR | SUBCUTANEOUS | 0 refills | Status: DC

## 2020-07-01 ENCOUNTER — Emergency Department: Payer: Medicare HMO

## 2020-07-01 ENCOUNTER — Other Ambulatory Visit: Payer: Self-pay

## 2020-07-01 ENCOUNTER — Emergency Department
Admission: EM | Admit: 2020-07-01 | Discharge: 2020-07-02 | Disposition: A | Discharge status: Home / Self Care | Payer: Medicare HMO | Attending: Emergency Medicine | Admitting: Emergency Medicine

## 2020-07-01 ENCOUNTER — Encounter: Payer: Self-pay | Admitting: Emergency Medicine

## 2020-07-01 DIAGNOSIS — R197 Diarrhea, unspecified: Secondary | ICD-10-CM | POA: Diagnosis not present

## 2020-07-01 DIAGNOSIS — G4489 Other headache syndrome: Secondary | ICD-10-CM | POA: Diagnosis not present

## 2020-07-01 DIAGNOSIS — R739 Hyperglycemia, unspecified: Secondary | ICD-10-CM

## 2020-07-01 DIAGNOSIS — Z794 Long term (current) use of insulin: Secondary | ICD-10-CM | POA: Insufficient documentation

## 2020-07-01 DIAGNOSIS — R059 Cough, unspecified: Secondary | ICD-10-CM | POA: Diagnosis not present

## 2020-07-01 DIAGNOSIS — R531 Weakness: Secondary | ICD-10-CM | POA: Diagnosis not present

## 2020-07-01 DIAGNOSIS — E1065 Type 1 diabetes mellitus with hyperglycemia: Secondary | ICD-10-CM | POA: Diagnosis present

## 2020-07-01 DIAGNOSIS — E1165 Type 2 diabetes mellitus with hyperglycemia: Secondary | ICD-10-CM

## 2020-07-01 DIAGNOSIS — Z87891 Personal history of nicotine dependence: Secondary | ICD-10-CM | POA: Diagnosis not present

## 2020-07-01 DIAGNOSIS — I2510 Atherosclerotic heart disease of native coronary artery without angina pectoris: Secondary | ICD-10-CM | POA: Insufficient documentation

## 2020-07-01 DIAGNOSIS — G894 Chronic pain syndrome: Secondary | ICD-10-CM | POA: Insufficient documentation

## 2020-07-01 DIAGNOSIS — I10 Essential (primary) hypertension: Secondary | ICD-10-CM | POA: Insufficient documentation

## 2020-07-01 DIAGNOSIS — R0602 Shortness of breath: Secondary | ICD-10-CM | POA: Diagnosis not present

## 2020-07-01 DIAGNOSIS — Z20822 Contact with and (suspected) exposure to covid-19: Secondary | ICD-10-CM | POA: Insufficient documentation

## 2020-07-01 DIAGNOSIS — R000 Tachycardia, unspecified: Secondary | ICD-10-CM | POA: Diagnosis not present

## 2020-07-01 DIAGNOSIS — Z8542 Personal history of malignant neoplasm of other parts of uterus: Secondary | ICD-10-CM | POA: Diagnosis not present

## 2020-07-01 LAB — COMPREHENSIVE METABOLIC PANEL
ALT: 35 U/L (ref 0–44)
AST: 42 U/L — ABNORMAL HIGH (ref 15–41)
Albumin: 3.6 g/dL (ref 3.5–5.0)
Alkaline Phosphatase: 126 U/L (ref 38–126)
Anion gap: 11 (ref 5–15)
BUN: 27 mg/dL — ABNORMAL HIGH (ref 8–23)
CO2: 19 mmol/L — ABNORMAL LOW (ref 22–32)
Calcium: 8.8 mg/dL — ABNORMAL LOW (ref 8.9–10.3)
Chloride: 103 mmol/L (ref 98–111)
Creatinine, Ser: 1.55 mg/dL — ABNORMAL HIGH (ref 0.44–1.00)
GFR, Estimated: 37 mL/min — ABNORMAL LOW (ref >60)
Glucose, Bld: 542 mg/dL (ref 70–99)
Potassium: 4.2 mmol/L (ref 3.5–5.1)
Sodium: 133 mmol/L — ABNORMAL LOW (ref 135–145)
Total Bilirubin: 1.2 mg/dL (ref 0.3–1.2)
Total Protein: 7.4 g/dL (ref 6.5–8.1)

## 2020-07-01 LAB — GLUCOSE, CAPILLARY
Glucose-Capillary: 380 mg/dL — ABNORMAL HIGH (ref 70–99)
Glucose-Capillary: 519 mg/dL (ref 70–99)

## 2020-07-01 LAB — CBC
HCT: 32.7 % — ABNORMAL LOW (ref 36.0–46.0)
Hemoglobin: 10 g/dL — ABNORMAL LOW (ref 12.0–15.0)
MCH: 24.9 pg — ABNORMAL LOW (ref 26.0–34.0)
MCHC: 30.6 g/dL (ref 30.0–36.0)
MCV: 81.3 fL (ref 80.0–100.0)
Platelets: 124 10*3/uL — ABNORMAL LOW (ref 150–400)
RBC: 4.02 MIL/uL (ref 3.87–5.11)
RDW: 17.5 % — ABNORMAL HIGH (ref 11.5–15.5)
WBC: 4.5 10*3/uL (ref 4.0–10.5)
nRBC: 0 % (ref 0.0–0.2)

## 2020-07-01 LAB — TROPONIN I (HIGH SENSITIVITY): Troponin I (High Sensitivity): 8 ng/L (ref <18)

## 2020-07-01 LAB — RESPIRATORY PANEL BY RT PCR (FLU A&B, COVID)
Influenza A by PCR: NEGATIVE
Influenza B by PCR: NEGATIVE
SARS Coronavirus 2 by RT PCR: NEGATIVE

## 2020-07-01 MED ORDER — INSULIN ASPART 100 UNIT/ML ~~LOC~~ SOLN
8.0000 [IU] | Freq: Once | SUBCUTANEOUS | Status: AC
  Administered 2020-07-01: 8 [IU] via INTRAVENOUS
  Filled 2020-07-01: qty 1

## 2020-07-01 MED ORDER — LEVEMIR FLEXTOUCH 100 UNIT/ML ~~LOC~~ SOPN: PEN_INJECTOR | SUBCUTANEOUS | 1 refills | Status: DC

## 2020-07-01 MED ORDER — OXYCODONE HCL 5 MG PO TABS
5.0000 mg | ORAL_TABLET | Freq: Once | ORAL | Status: AC
  Administered 2020-07-01: 5 mg via ORAL
  Filled 2020-07-01: qty 1

## 2020-07-01 MED ORDER — LACTATED RINGERS IV BOLUS
1000.0000 mL | Freq: Once | INTRAVENOUS | Status: AC
  Administered 2020-07-01: 1000 mL via INTRAVENOUS

## 2020-07-01 NOTE — ED Triage Notes (Addendum)
Patient brought in by ems from home. Patient states that she is a type 1 diabetic and that she ran out of Levimer insulin. Patient states that her blood sugar has been running in the 600's for 6 days. Patient states that she is feeling dizzy and has diarrhea. EMS started 500 ml bolus.

## 2020-07-01 NOTE — ED Provider Notes (Signed)
-----------------------------------------   11:05 PM on 07/01/2020 -----------------------------------------  Assuming care from Dr. Charna Archer.  In short, [name] is a 64 y.o. female with a chief complaint of hyperglycemia.  Refer to the original H&P for additional details.  The current plan of care is to reassess after fluids and insulin.  Anticipate discharge.   ----------------------------------------- 12:39 AM on 07/02/2020 -----------------------------------------  The patient's glucoses come down to 380.  She says that she feels better and is ready to go home.  She is ambulatory to and from the commode without difficulty.  Dr. Charna Archer has already spoken with her husband about the importance of getting her insulin in the morning.  She is being discharged with the instructions prepared by Dr. Charna Archer.   Hinda Kehr, MD 07/02/20 0040

## 2020-07-01 NOTE — ED Notes (Signed)
Patient to ED waiting room by [facility].  Per EMS patient with complaint of elevated blood sugar.  States has been out of insulin for the past 5 days, expected delivery of insulin today but did not arrive.  EMS interventions -- CBG reading high, BP 160/108, hr 100-110, pulse oxi 99% on room air, iv via 20 g angiocath to left wrist area, received 200 ml of normal saline.

## 2020-07-01 NOTE — ED Provider Notes (Signed)
[name]   ____________________________________________   First MD Initiated Contact with Patient 07/01/20 2147     (approximate)  I have reviewed the triage vital signs and the nursing notes.   HISTORY  Chief Complaint Hyperglycemia    HPI [name] is a 64 y.o. female with possible history of hypertension, hyperlipidemia, diabetes, stroke, atrial fibrillation on Eliquis, CAD, and chronic pain who presents to the ED complaining of hyperglycemia.  Patient reports that she has been without her Levemir insulin for the past 6 days and her blood sugar has been in the 600s during this time.  She does not currently take any short acting insulin or oral medications for her diabetes.  She states that her pharmacy delivery service provided all of her other medications, but forgot the insulin.  She also complains of malaise with a nonproductive cough and some mild difficulty breathing recently.  She is concerned that she could have been exposed to COVID-19 by a neighbor.  She does states she is fully vaccinated against COVID-19.  She also endorses some diarrhea, but denies any nausea or vomiting.        Past Medical History:  Diagnosis Date  . Acute encephalopathy 05/22/2018  . Allergy   . Anginal pain ([place])   . Anxiety   . Ascites   . C. difficile colitis 07/10/2015  . Cancer (HCC)    HX OF CANCER OF UTERUS   . Cirrhosis of liver not due to alcohol ([place]) 2016  . Degenerative disk disease   . Diverticulitis   . Gastroparesis   . GERD (gastroesophageal reflux disease)   . History of hiatal hernia   . Hypertension   . Hypothyroid   . Hypothyroidism due to amiodarone   . Ileus ([place]) 08/01/2015  . Intussusception intestine ([place]) 05/2015  . Orthostatic hypotension   . PAF (paroxysmal atrial fibrillation) ([place]) 03/2015   a. new onset 03/2015 in setting of intractable N/V; b. on Eliquis 5 mg bid; c. CHADSVASc 4 (DM, TIA  x 2, female)  . Pancreatitis   . Pneumonia 11/14/2015  . Right ureteral stone 07/14/2016  . Sepsis ([place]) 12/15/2017  . Sick sinus syndrome ([place])   . Stomach ulcer   . Stroke [facility])    with minimal left sided weakness  . Syncope 01/2015  . Syncope due to orthostatic hypotension 05/18/2015  . Tachyarrhythmia 01/10/2016  . TIA (transient ischemic attack) 02/2015  . Type 1 diabetes ([place])    on levemir  . UTERINE CANCER, HX OF 03/27/2007   Qualifier: Diagnosis of  By: Maxie Better FNP, Rosalita Levan   . UTI (urinary tract infection) 05/22/2018    Patient Active Problem List   Diagnosis Date Noted  . Sepsis ([place]) 11/23/2019  . Acute pyelonephritis 09/30/2019  . Acute pain of left foot 08/18/2019  . Type II diabetes mellitus with renal manifestations ([place]) 08/13/2019  . Weakness of both lower extremities 07/31/2019  . Altered mental status 07/14/2019  . GAD (generalized anxiety disorder) 06/11/2019  . Poor memory 06/11/2019  . Constipation 05/25/2019  . Acute medial meniscus tear of left knee 05/25/2019  . AKI (acute kidney injury) ([place]) 05/16/2019  . Closed fracture of left distal fibula 05/16/2019  . Sinus tachycardia 05/05/2019  . Congestive dilated cardiomyopathy ([place]) 04/14/2019  . CAD (coronary artery disease) 04/10/2019  . Closed fracture of lateral malleolus 04/10/2019  . Lumbar facet syndrome (Bilateral) (R>L) 03/09/2019  . Long term (current) use of anticoagulants  03/09/2019  . Unspecified cirrhosis of liver ([place]) 02/11/2019  . Hypoalbuminemia 02/11/2019  . Edema due to hypoalbuminemia 02/11/2019  . Lower extremity edema 01/26/2019  . Prolonged QT interval 12/17/2018  . Hypomagnesemia 12/17/2018  . Uncontrolled type 2 diabetes mellitus ([place]) 12/17/2018  . H/O TIA (transient ischemic attack) and stroke 12/17/2018  . Personal history of surgery to heart and great vessels, presenting hazards to health 12/17/2018  . DDD (degenerative disc disease), lumbar 12/17/2018  . Lumbar  intervertebral disc displacement 12/17/2018  . Chronic musculoskeletal pain 12/17/2018  . Chronic generalized pain 12/17/2018  . Hyponatremia 12/17/2018  . Fibromyalgia syndrome 12/17/2018  . Other intervertebral disc displacement, lumbar region 12/17/2018  . Vitamin D deficiency 11/26/2018  . Elevated C-reactive protein (CRP) 11/26/2018  . Elevated sed rate 11/26/2018  . Chronic low back pain (Primary area of Pain) (Bilateral) (L>R) w/ sciatica (Bilateral) 11/25/2018  . Chronic lower extremity pain (Secondary Area of Pain) (Bilateral) (L>R) 11/25/2018  . Chronic neck pain 11/25/2018  . Pharmacologic therapy 11/25/2018  . Disorder of skeletal system 11/25/2018  . Problems influencing health status 11/25/2018  . Renal mass 10/06/2018  . Chronic intermittent abdominal pain 09/25/2018  . Unspecified abdominal pain 09/25/2018  . Frequent falls 09/12/2018  . Orthostatic hypotension 08/18/2018  . Normochromic normocytic anemia 08/18/2018  . Acute renal failure superimposed on stage 3a chronic kidney disease ([place]) 08/18/2018  . History of hypothyroidism 08/14/2018  . Medical non-compliance 08/14/2018  . Diabetic ketoacidosis ([place]) 05/22/2018  . UTI (urinary tract infection) 05/22/2018  . NSTEMI (non-ST elevated myocardial infarction) ([place]) 01/11/2016  . Tachy-brady syndrome ([place]) 12/28/2015  . PAF (paroxysmal atrial fibrillation) ([place]) 11/24/2015  . Type 2 diabetes mellitus with neurologic complication ([place]) 16/38/4665  . S/P cholecystectomy 11/23/2015  . Narcotic abuse ([place]) 11/22/2015  . Chronic superficial gastritis without bleeding 11/22/2015  . History of Clostridium difficile 11/22/2015  . History of TIA (transient ischemic attack) 11/22/2015  . Mixed hyperlipidemia 11/22/2015  . Diverticulosis 11/22/2015  . History of uterine cancer 11/22/2015  . Hypothyroidism, unspecified 11/22/2015  . Intractable cyclical vomiting with nausea 11/22/2015  . Narcotic withdrawal ([place])  11/11/2015  . Hyperlipidemia 08/31/2015  . Hypotension 08/06/2015  . Cryptogenic cirrhosis ([place]) 07/10/2015  . Atrial fibrillation ([place]) 07/10/2015  . Malnutrition of moderate degree 07/04/2015  . Symptomatic bradycardia 05/27/2015  . Chronic anemia 05/19/2015  . Thrombocytopenia ([place]) 05/19/2015  . Hypokalemia 04/06/2015  . Hyperlipidemia with target LDL less than 100 04/06/2015  . Syncope 03/11/2015  . CVA (cerebral vascular accident) ([place]) 02/15/2015  . Essential hypertension 01/12/2015  . Chronically on opiate therapy 01/12/2015  . Cirrhosis of liver not due to alcohol ([place]) 2016  . S/P Nissen fundoplication (without gastrostomy tube) procedure 05/11/2014  . Dysphagia 04/19/2014  . DEPRESSION/ANXIETY 06/27/2007  . Myofascial pain syndrome 06/27/2007  . Chronic pain syndrome 03/28/2007  . GERD 03/27/2007  . Diverticulosis of large intestine without perforation or abscess without bleeding 03/27/2007  . Proteinuria 03/27/2007    Past Surgical History:  Procedure Laterality Date  . ABDOMINAL HYSTERECTOMY    . CARDIAC CATHETERIZATION N/A 01/12/2016   Procedure: Left Heart Cath and Coronary Angiography;  Surgeon: Wellington Hampshire, MD;  Location: [place] CV [facility];  Service: Cardiovascular;  Laterality: N/A;  . CHOLECYSTECTOMY    . CYSTOSCOPY W/ RETROGRADES Bilateral 12/11/2019   Procedure: CYSTOSCOPY WITH RETROGRADE PYELOGRAM;  Surgeon: Billey Co, MD;  Location: [facility] ORS;  Service: Urology;  Laterality: Bilateral;  . CYSTOSCOPY WITH BIOPSY N/A 12/11/2019  Procedure: CYSTOSCOPY WITH BLADDER BIOPSY;  Surgeon: Billey Co, MD;  Location: [facility] ORS;  Service: Urology;  Laterality: N/A;  . CYSTOSCOPY WITH FULGERATION N/A 12/11/2019   Procedure: CYSTOSCOPY WITH FULGERATION;  Surgeon: Billey Co, MD;  Location: [facility] ORS;  Service: Urology;  Laterality: N/A;  . CYSTOSCOPY WITH STENT PLACEMENT Right 12/11/2019   Procedure: CYSTOSCOPY WITH STENT PLACEMENT;  Surgeon: Billey Co, MD;  Location: [facility] OR[facility];  Service: Urology;  Laterality: Right;  . CYSTOSCOPY WITH URETHRAL DILATATION Right 12/11/2019   Procedure: CYSTOSCOPY WITH URETERAL DILATATION;  Surgeon: Billey Co, MD;  Location: [facility] ORS;  Service: Urology;  Laterality: Right;  . CYSTOSCOPY/URETEROSCOPY/HOLMIUM LASER Right 07/14/2016   Procedure: CYSTOSCOPY/URETEROSCOPY/HOLMIUM LASER;  Surgeon: Alexis Frock, MD;  Location: [facility] ORS;  Service: Urology;  Laterality: Right;  . ESOPHAGOGASTRODUODENOSCOPY N/A 04/04/2015   Procedure: ESOPHAGOGASTRODUODENOSCOPY (EGD);  Surgeon: Hulen Luster, MD;  [facility] ENDOSCOPY;  Service: Endoscopy;  Laterality: N/A;  . ESOPHAGOGASTRODUODENOSCOPY N/A 12/28/2017   Procedure: ESOPHAGOGASTRODUODENOSCOPY (EGD);  Surgeon: Lin Landsman, MD;  Location: [facility] ENDOSCOPY;  Service: Gastroenterology;  Laterality: N/A;  . ESOPHAGOGASTRODUODENOSCOPY (EGD) WITH PROPOFOL N/A 01/18/2016   Procedure: ESOPHAGOGASTRODUODENOSCOPY (EGD) WITH PROPOFOL;  Surgeon: Lucilla Lame, MD;  Location: [facility] ENDOSCOPY;  Service: Endoscopy;  Laterality: N/A;  . FLEXIBLE SIGMOIDOSCOPY N/A 01/18/2016   Procedure: FLEXIBLE SIGMOIDOSCOPY;  Surgeon: Lucilla Lame, MD;  Location: [facility] ENDOSCOPY;  Service: Endoscopy;  Laterality: N/A;  . HERNIA REPAIR    . URETEROSCOPY Right 12/11/2019   Procedure: Christen Butter;  Surgeon: Billey Co, MD;  Location: [facility] ORS;  Service: Urology;  Laterality: Right;    Prior to Admission medications   Medication Sig Start Date End Date Taking? Authorizing Provider  albuterol (PROVENTIL HFA;VENTOLIN HFA) 108 (90 Base) MCG/ACT inhaler Inhale 2 puffs into the lungs every 6 (six) hours as needed for wheezing or shortness of breath. 11/27/18   Epifanio Lesches, MD  atorvastatin (LIPITOR) 40 MG tablet Take 40 mg by mouth at bedtime.     [provider]  cholecalciferol (VITAMIN D3) 25 MCG (1000 UNIT) tablet Take 1,000 Units by mouth daily.    [provider]  cyclobenzaprine (FLEXERIL) 10 MG tablet Take 1 tablet (10 mg total) by mouth 3 (three) times daily as needed for muscle spasms. Must last 30 days. 07/07/20 10/05/20  Milinda Pointer, MD  glucose blood (ACCU-CHEK AVIVA PLUS) test strip Use as instructed to test blood sugar up to 4 times daily 10/09/19   [name], NP  insulin detemir (LEVEMIR FLEXTOUCH) 100 UNIT/ML FlexPen INJECT 30 UNITS SUBCUTANEOSLY EVERY MORNING AND AT BEDTIME 07/01/20   Blake Divine, MD  magnesium gluconate (MAGONATE) 500 MG tablet Take 500 mg by mouth 2 (two) times daily.    [provider]  metoprolol tartrate (LOPRESSOR) 25 MG tablet Take 25 mg by mouth 2 (two) times daily. 04/08/20   [provider]  nitroGLYCERIN (NITROSTAT) 0.3 MG SL tablet Place 1 tablet (0.3 mg total) under the tongue every 5 (five) minutes as needed for chest pain. 07/24/19 07/23/20  Nena Polio, MD  oxyCODONE (ROXICODONE) 5 MG immediate release tablet Take 1 tablet (5 mg total) by mouth every 4 (four) hours as needed for severe pain. 07/07/20 08/06/20  Milinda Pointer, MD  sertraline (ZOLOFT) 50 MG tablet TAKE ONE TABLET EVERY DAY 06/03/20   [name], NP    Allergies Aspirin, Codeine sulfate, Erythromycin, Prednisone, Rosiglitazone maleate, and Tetanus-diphtheria toxoids td  Family History  Problem  Relation Age of Onset  . Hypertension Mother   . CAD Sister   . Heart attack Sister        Deceased November 10, 2014  . CAD Brother     Social History Social History   Tobacco Use  . Smoking status: Former Smoker    Types: Cigarettes  . Smokeless tobacco: Never Used  . Tobacco comment: 25 years ago and only smoked occasionally  Vaping Use  . Vaping Use: Never used  Substance Use Topics  . Alcohol use: No  . Drug use: No    Review of Systems  Constitutional: No fever/chills.  Positive for malaise. Eyes: No visual changes. ENT: No sore throat. Cardiovascular: Denies chest  pain. Respiratory: Positive for cough and shortness of breath. Gastrointestinal: No abdominal pain.  No nausea, no vomiting.  Positive for diarrhea.  No constipation. Genitourinary: Negative for dysuria. Musculoskeletal: Negative for back pain. Skin: Negative for rash. Neurological: Negative for headaches, focal weakness or numbness.  ____________________________________________   PHYSICAL EXAM:  VITAL SIGNS: ED Triage Vitals [07/01/20 1943]  Enc Vitals Group     BP (!) 133/91     Pulse Rate (!) 111     Resp 18     Temp 98.6 F (37 C)     Temp Source Oral     SpO2 100 %     Weight 135 lb (61.2 kg)     Height 5' 3"  (1.6 m)     Head Circumference      Peak Flow      Pain Score 10     Pain Loc      Pain Edu?      Excl. in [place]?     Constitutional: Alert and oriented. Eyes: Conjunctivae are normal. Head: Atraumatic. Nose: No congestion/rhinnorhea. Mouth/Throat: Mucous membranes are dry. Neck: Normal ROM Cardiovascular: Normal rate, regular rhythm. Grossly normal heart sounds. Respiratory: Normal respiratory effort.  No retractions. Lungs CTAB. Gastrointestinal: Soft and nontender. No distention. Genitourinary: deferred Musculoskeletal: No lower extremity tenderness nor edema. Neurologic:  Normal speech and language. No gross focal neurologic deficits are appreciated. Skin:  Skin is warm, dry and intact. No rash noted. Psychiatric: Mood and affect are normal. Speech and behavior are normal.  ____________________________________________   LABS (all labs ordered are listed, but only abnormal results are displayed)  Labs Reviewed  GLUCOSE, CAPILLARY - Abnormal; Notable for the following components:      Result Value   Glucose-Capillary 519 (*)    All other components within normal limits  CBC - Abnormal; Notable for the following components:   Hemoglobin 10.0 (*)    HCT 32.7 (*)    MCH 24.9 (*)    RDW 17.5 (*)    Platelets 124 (*)    All other components within  normal limits  COMPREHENSIVE METABOLIC PANEL - Abnormal; Notable for the following components:   Sodium 133 (*)    CO2 19 (*)    Glucose, Bld 542 (*)    BUN 27 (*)    Creatinine, Ser 1.55 (*)    Calcium 8.8 (*)    AST 42 (*)    GFR, Estimated 37 (*)    All other components within normal limits  RESPIRATORY PANEL BY RT PCR (FLU A&B, COVID)  URINALYSIS, COMPLETE (UACMP) WITH MICROSCOPIC  CBG MONITORING, ED  CBG MONITORING, ED  TROPONIN I (HIGH SENSITIVITY)   ____________________________________________  EKG  ED ECG REPORT I, Blake Divine, the attending physician, personally viewed and interpreted this ECG.  Date: 07/01/2020  EKG Time: 19:42  Rate: 109  Rhythm: sinus tachycardia  Axis: Normal  Intervals:none  ST&T Change: Nonspecific T wave changes   PROCEDURES  Procedure(s) performed (including Critical Care):  Procedures   ____________________________________________   INITIAL IMPRESSION / ASSESSMENT AND PLAN / ED COURSE       64 year old female with past medical history of hypertension, hyperlipidemia, diabetes, CAD, stroke, atrial fibrillation, and chronic pain who presents to the ED complaining of hyperglycemia for the past 6 days after not being able to refill her long-acting insulin.  Lab work shows a glucose of 542 but no changes to suggest DKA as her anion gap is within normal limits.  Renal function is stable compared to prior and there is no other acute electrolyte abnormality.  We will hydrate patient with IV fluids and give dose of IV insulin for her hyperglycemia.  She additionally complains of malaise, cough, mild shortness of breath, and possible recent sick contact.  She is not in any respiratory distress and is maintaining O2 sats on room air, chest x-ray also reviewed by me and shows no infiltrate or edema.  EKG shows no evidence of arrhythmia or ischemia and troponin is negative.  We will perform Covid 19 testing but given reassuring work-up, she  will be appropriate for discharge home once her glucose level is improved.  UA is also pending at this time.  I spoke with her husband over the phone, who states he will have no issues picking up her insulin prescription tomorrow.  Patient turned over to oncoming provider pending recheck of glucose levels and UA results.  Patient was given a dose of her usual chronic pain medication.      ____________________________________________   FINAL CLINICAL IMPRESSION(S) / ED DIAGNOSES  Final diagnoses:  Hyperglycemia  Generalized weakness  Cough  Chronic pain syndrome     ED Discharge Orders         Ordered    insulin detemir (LEVEMIR FLEXTOUCH) 100 UNIT/ML FlexPen        07/01/20 2255           Note:  This document was prepared using Dragon voice recognition software and may include unintentional dictation errors.   Blake Divine, MD 07/01/20 2258

## 2020-07-01 NOTE — Telephone Encounter (Signed)
Note from last month does not state

## 2020-07-06 ENCOUNTER — Other Ambulatory Visit: Payer: Self-pay | Admitting: Primary Care

## 2020-07-11 IMAGING — CT CT RENAL STONE PROTOCOL
2 of 4 series · 15 of 46 positions shown, 17 images · non-contrast
Comparison: CT 08/06/2016

CLINICAL DATA: Left upper quadrant pain

EXAM:
CT ABDOMEN AND PELVIS WITHOUT CONTRAST
TECHNIQUE: Multidetector CT imaging of the abdomen and pelvis was performed
following the standard protocol without IV contrast.

[Series 3: ap without · axial · non-contrast · 0.62mm/px · z∈[+857,+1277]mm · 12 of 94 slices shown, 14 images]
[im 5/94  soft-tissue]
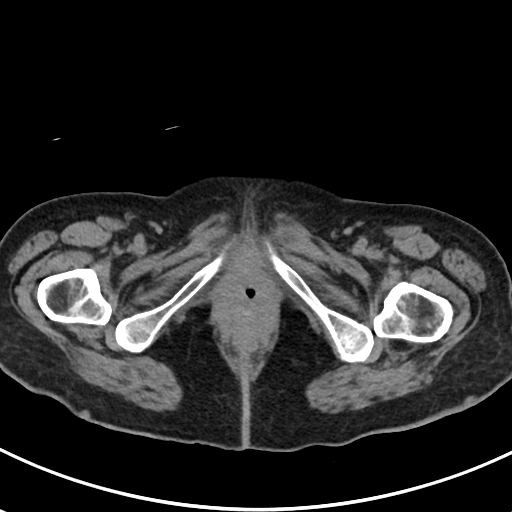
[im 5/94  bone]
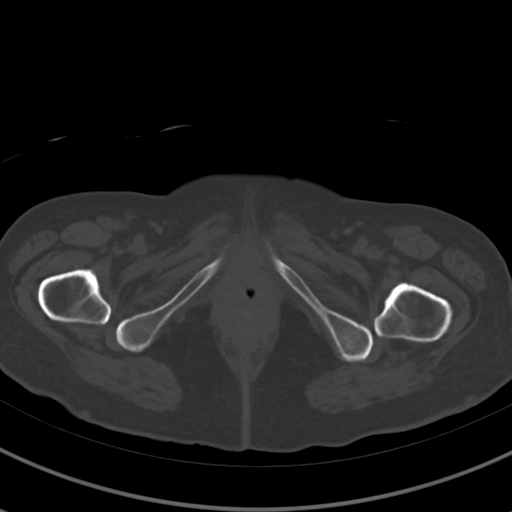
[im 14/94  soft-tissue]
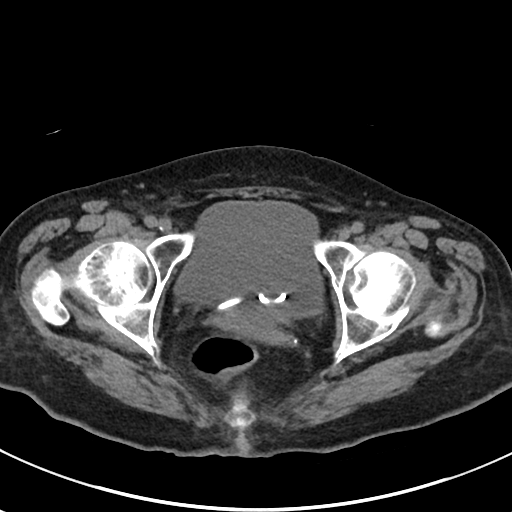
[im 19/94  soft-tissue]
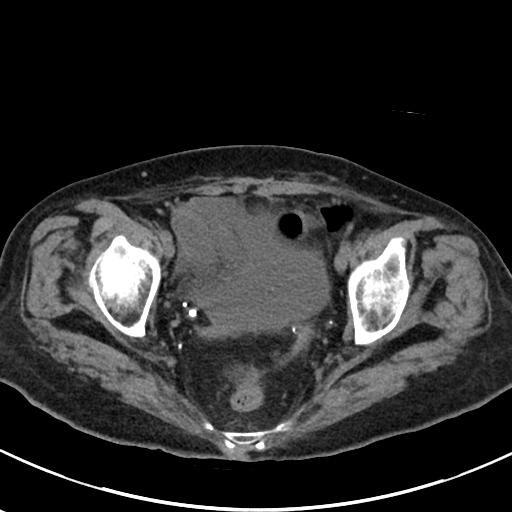
[im 28/94  soft-tissue]
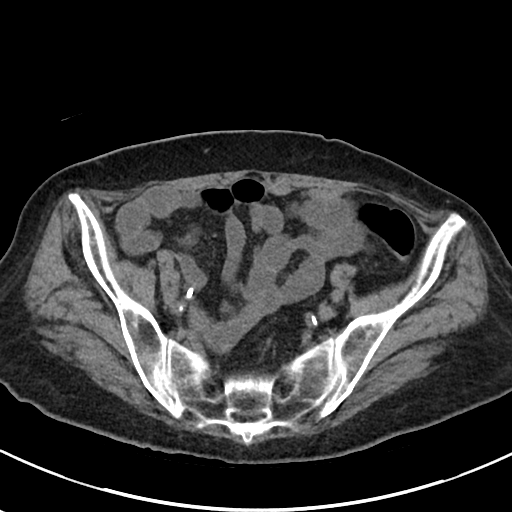
[im 38/94  soft-tissue]
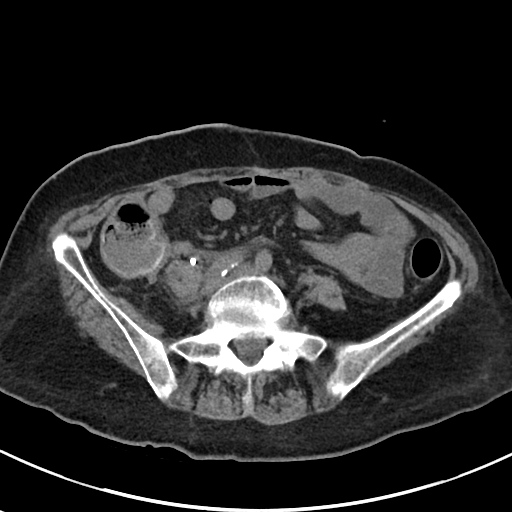
[im 42/94  soft-tissue]
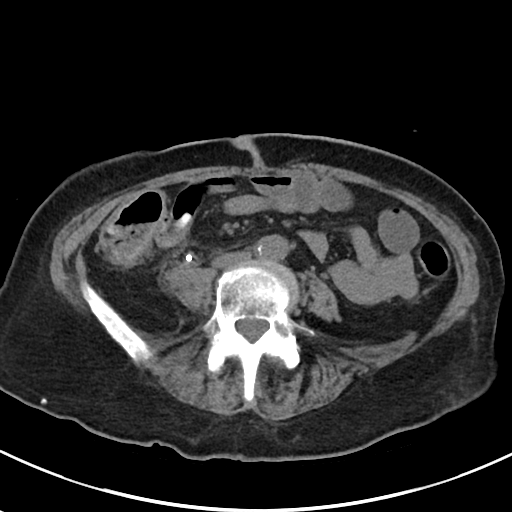
[im 52/94  soft-tissue]
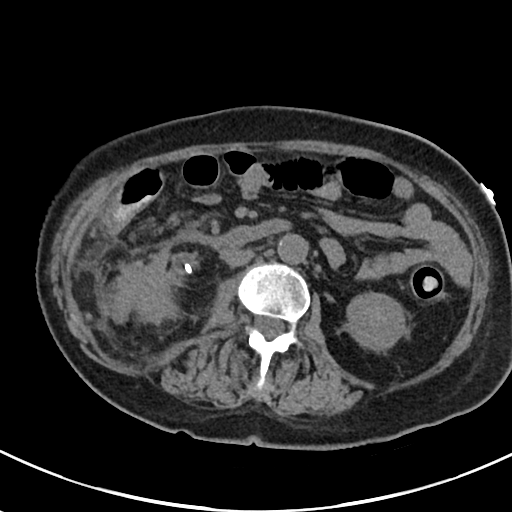
[im 56/94  soft-tissue]
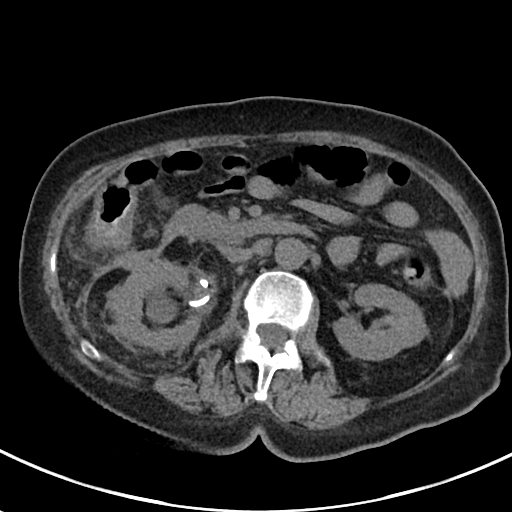
[im 66/94  soft-tissue]
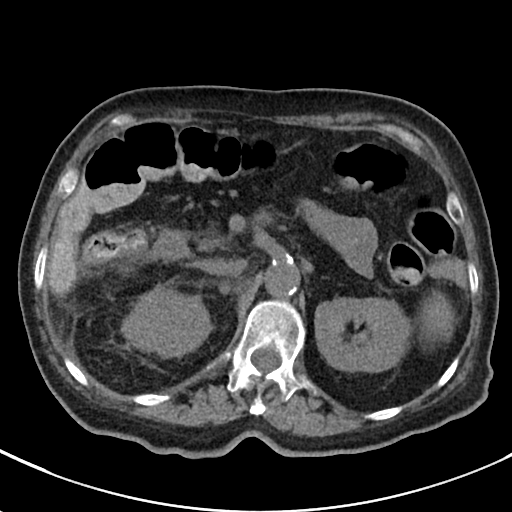
[im 66/94  bone]
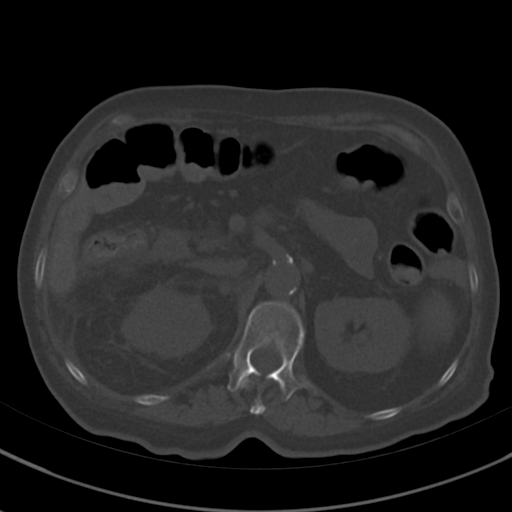
[im 75/94  soft-tissue]
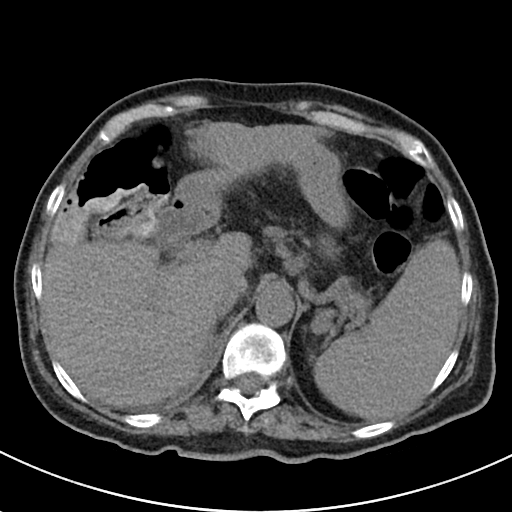
[im 80/94  soft-tissue]
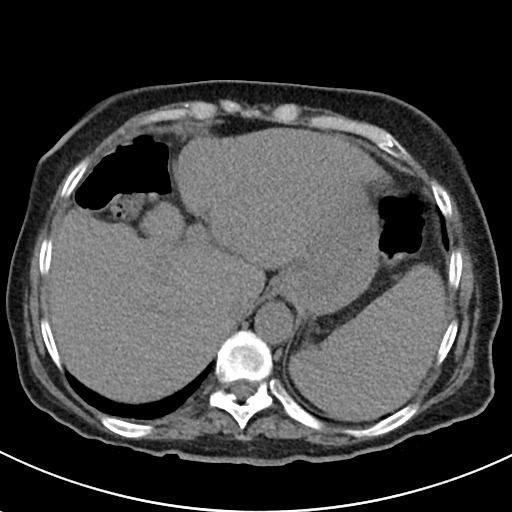
[im 89/94  soft-tissue]
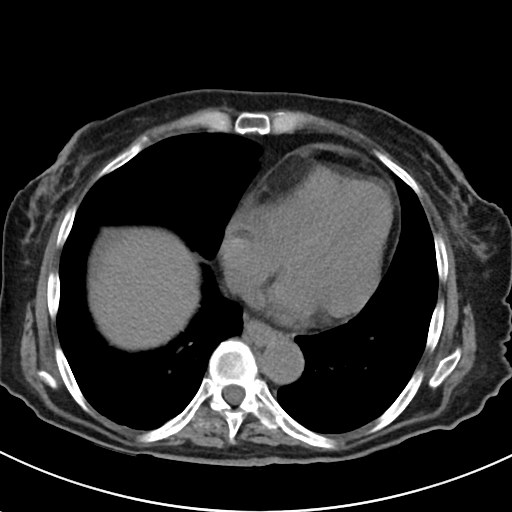

[Series 6: cor · coronal · 0.80mm/px · 3 of 101 slices shown]
[im 34/101  soft-tissue]
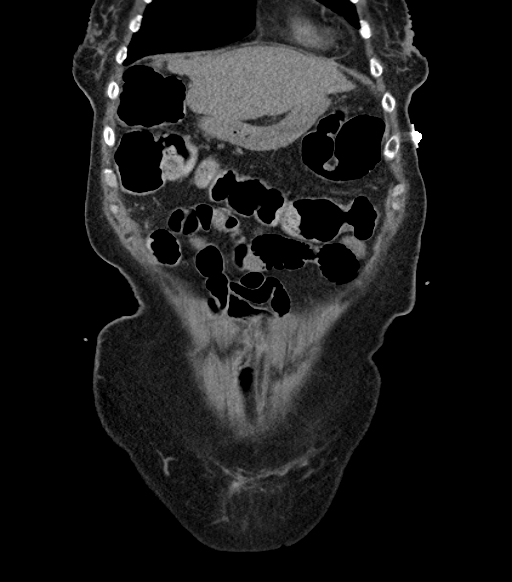
[im 45/101  soft-tissue]
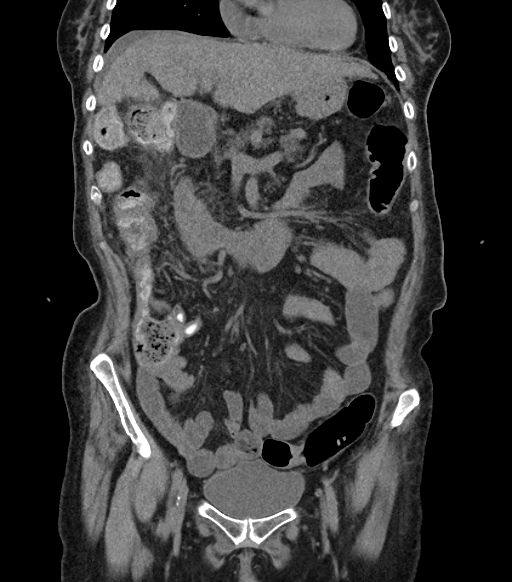
[im 56/101  soft-tissue]
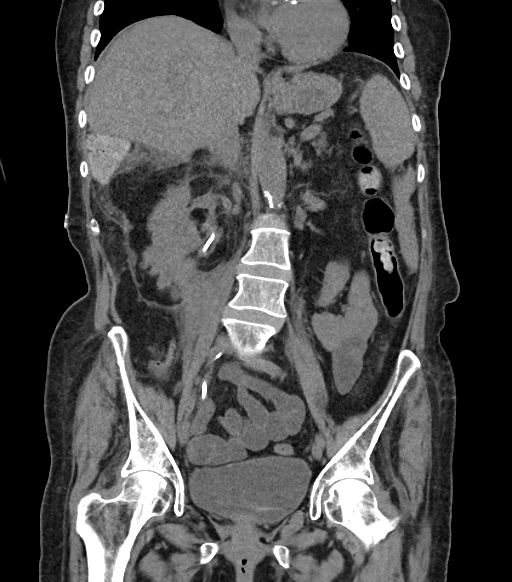

[15 of 46 positions shown; findings below may reference images not displayed]

FINDINGS: Lower chest: Lung bases demonstrate no acute consolidation or
pleural effusion. Heart size within normal limits. Trace pericardial
effusion.

Hepatobiliary: No focal liver abnormality is seen. Status post
cholecystectomy. No biliary dilatation. Nodular liver contour
consistent with cirrhosis.

Pancreas: Unremarkable. No pancreatic ductal dilatation or
surrounding inflammatory changes.

Spleen: Borderline to slightly enlarged.

Adrenals/Urinary Tract: Adrenal glands are within normal limits.
Right ureteral stent with proximal aspect in the right renal pelvis.
Distal portion of the stent within the left aspect of the bladder.
No stones identified along the course of the ureter. Moderate
hydronephrosis is present. Slightly dense material is present within
the right renal collecting system with possible fluid level. The
right ureter is also dilated with urothelial thickening. Moderate
perinephric edema and fat stranding on the right. Irregular
hyperdensities within the inferior right perinephric space, some of
which are somewhat masslike in appearance, measuring up to 3 cm in
size.

Punctate stones in the mid and lower pole of the left kidney.

Stomach/Bowel: The stomach is nonenlarged. No dilated small bowel.
There may be mild right colon wall thickening. The appendix is
negative.

Vascular/Lymphatic: Mild to moderate aortic atherosclerosis. No
aneurysmal dilatation. Retroperitoneal lymph nodes measuring up to
11 mm in size.

Reproductive: Status post hysterectomy. No adnexal masses.

Other: No free air.  No significant free fluid.

Musculoskeletal: Asymmetric enlargement of the right psoas muscle
which appears slightly dense. This is contiguous with irregular
density inferior to the right kidney. Mild scoliosis of the spine.
Degenerative changes at L5-S1.
IMPRESSION: 1. A right ureteral stent is in place. There is moderate right
hydronephrosis and hydroureter, raising possibility of stent
malfunction. Moderate to marked right perinephric edema and slightly
hyperdense thickening or fluid. Irregular densities are present
within the right perinephric space, inferior to the right kidney,
these appears somewhat masslike. Density is contiguous with the
right psoas muscle which appears asymmetrically enlarged compared to
the left. Findings could be secondary to organizing retroperitoneal
hematoma, infection, or potentially soft tissue mass given the
configuration. Follow-up contrast enhanced study when clinically
feasible might be helpful to clarify the findings.
2. Mild wall thickening of the ascending colon is questionable for a
mild colitis.
3. Cirrhotic morphology of the liver. Spleen borderline to slightly
enlarged.
4. Mild retroperitoneal adenopathy
5. Punctate nonobstructing stones in the left kidney.

## 2020-07-11 IMAGING — DX DG THORACIC SPINE 2V
3 series · 3 of 3 positions shown · non-contrast
Comparison: Chest x-ray dated 05/22/2018

CLINICAL DATA: Midthoracic spine pain. Multiple falls in the last 3
days.

EXAM:
THORACIC SPINE 2 VIEWS

[t thoracic spine lat]
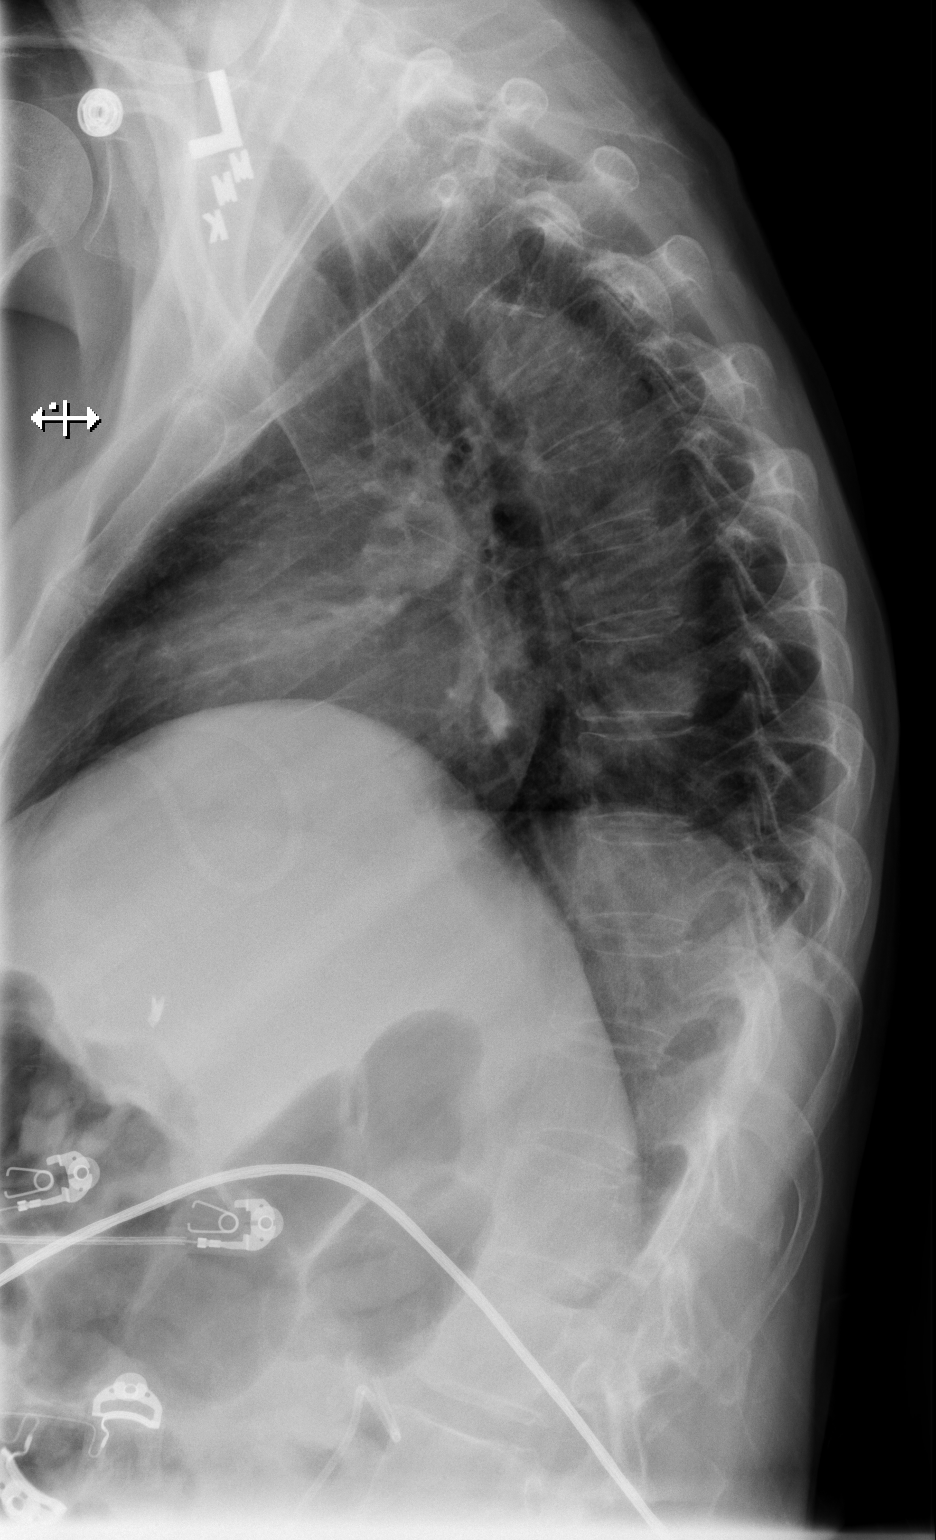

[t thoracic swimmers]
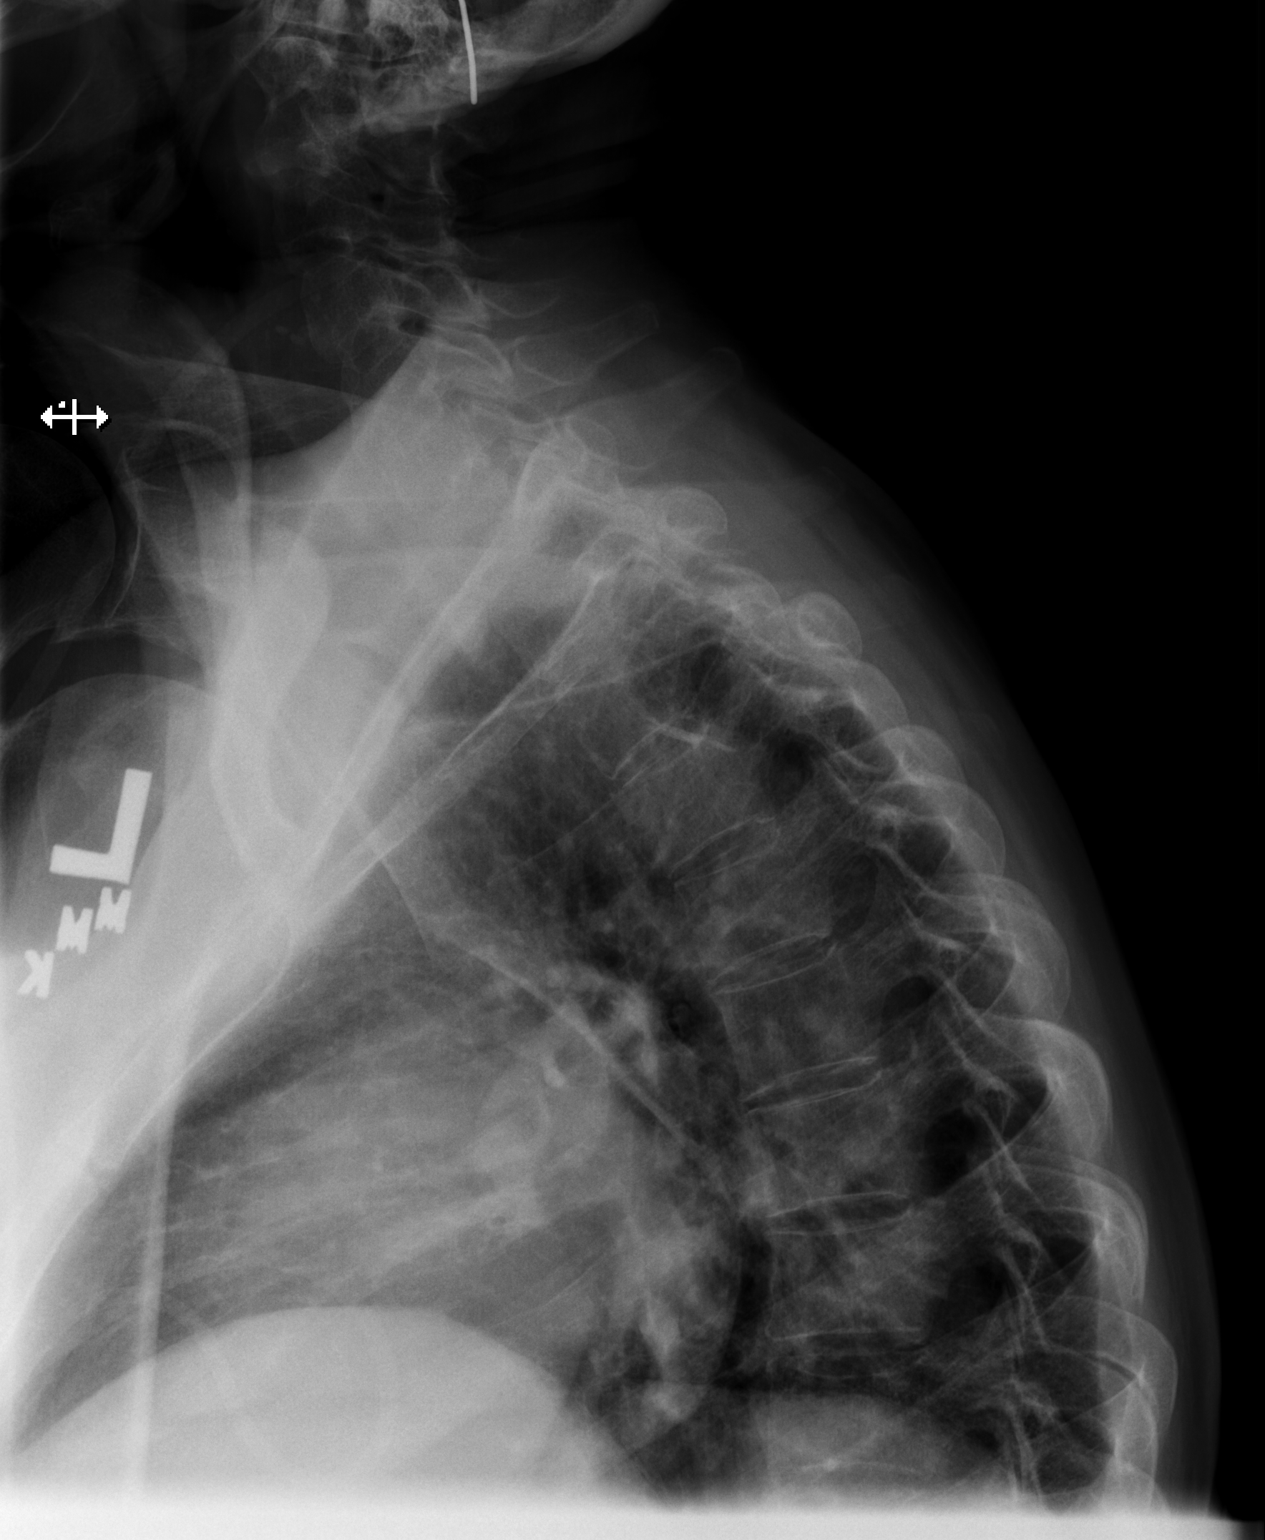

[t thoracic spine ap]
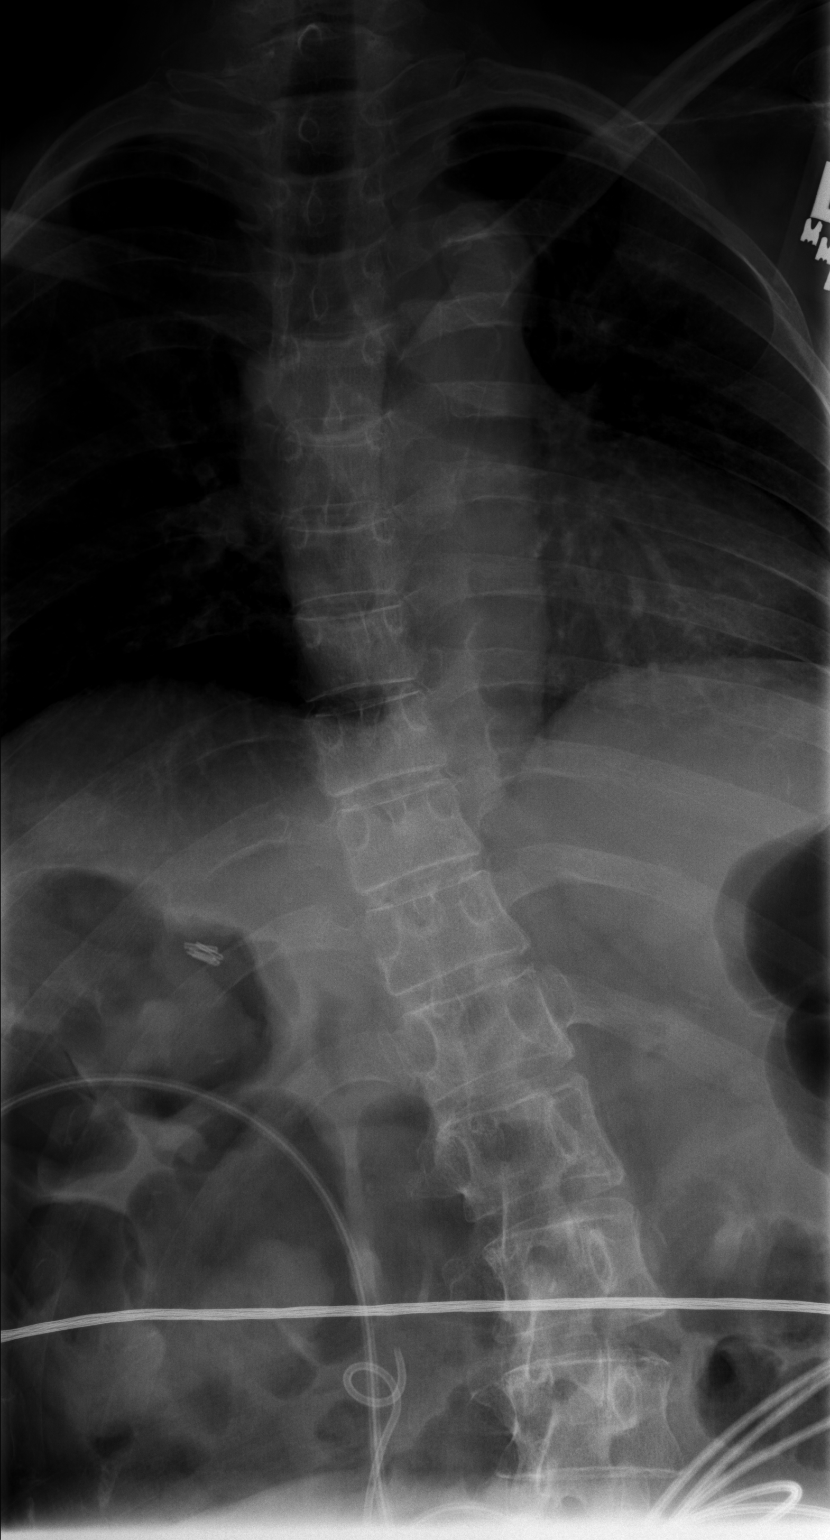

[3 of 3 positions shown; findings below may reference images not displayed]

FINDINGS: There is no evidence of thoracic spine fracture. Slight
thoracolumbar scoliosis. No other significant bone abnormalities are
identified.
IMPRESSION: No significant abnormality of the thoracic spine.

## 2020-07-11 IMAGING — CR DG HIP (WITH OR WITHOUT PELVIS) 2-3V*L*
3 series · 3 of 3 positions shown · non-contrast
Comparison: None.

CLINICAL DATA: LEFT hip pain after fall.

EXAM:
DG HIP (WITH OR WITHOUT PELVIS) 2-3V LEFT

[hip ap]
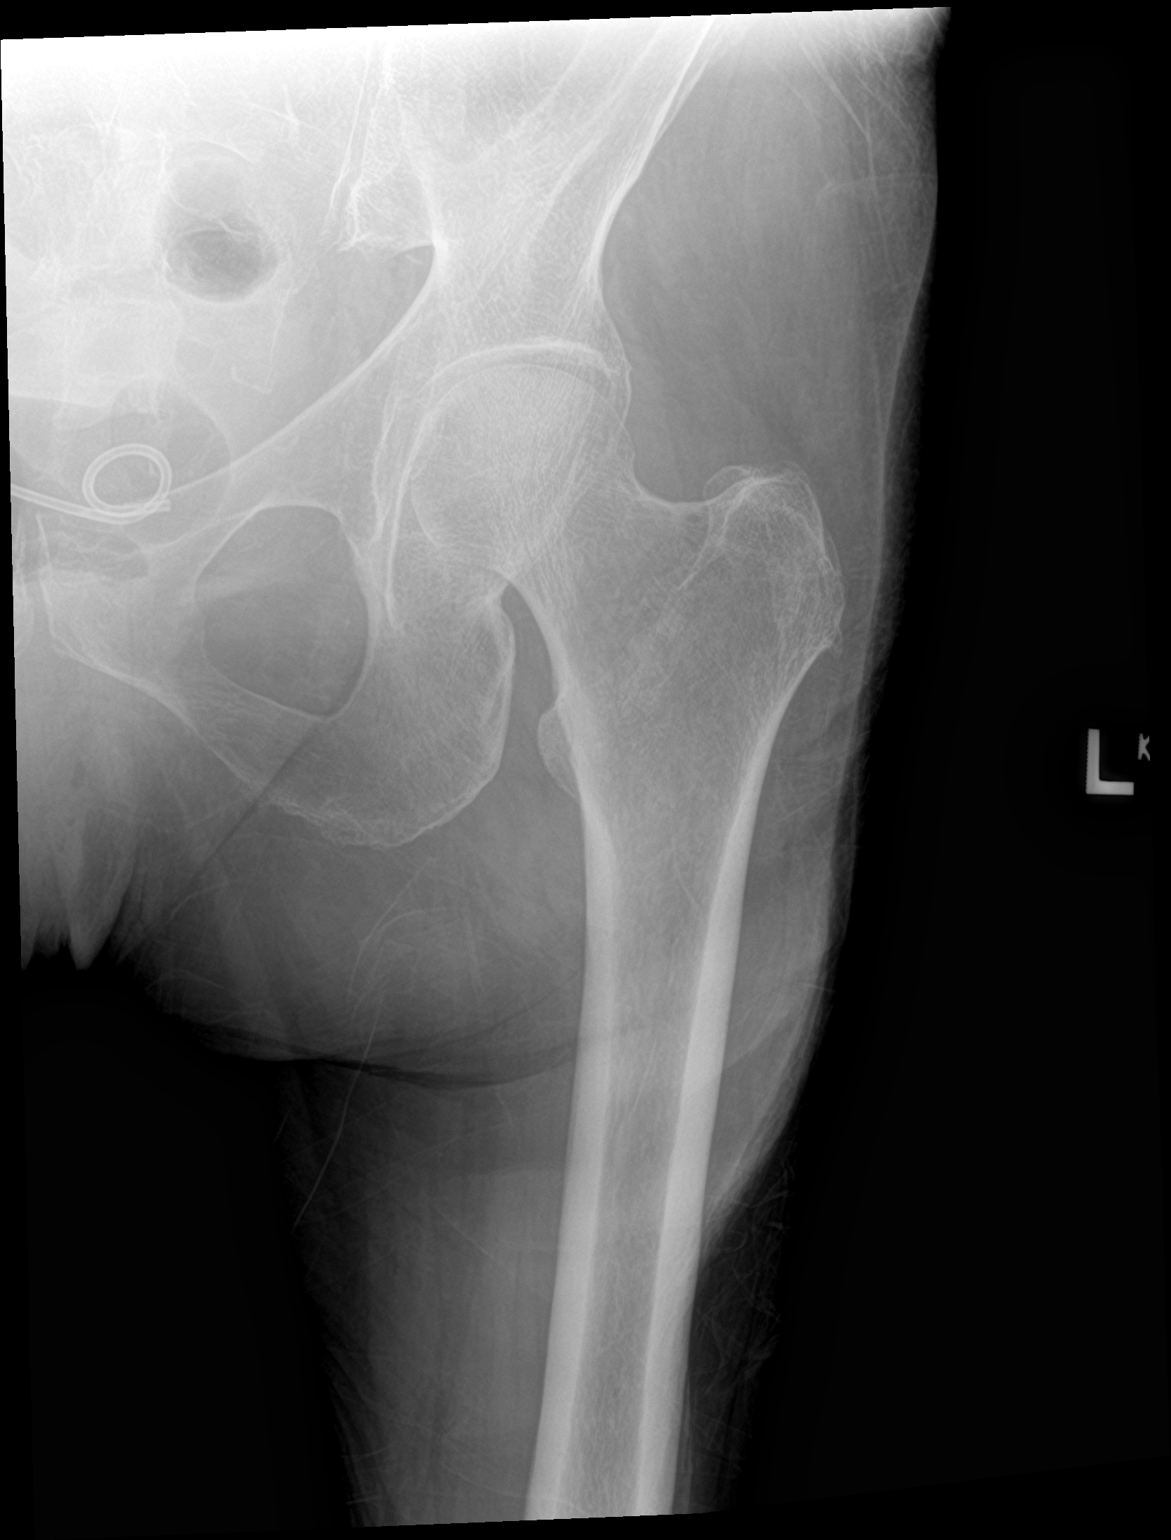

[hip lat]
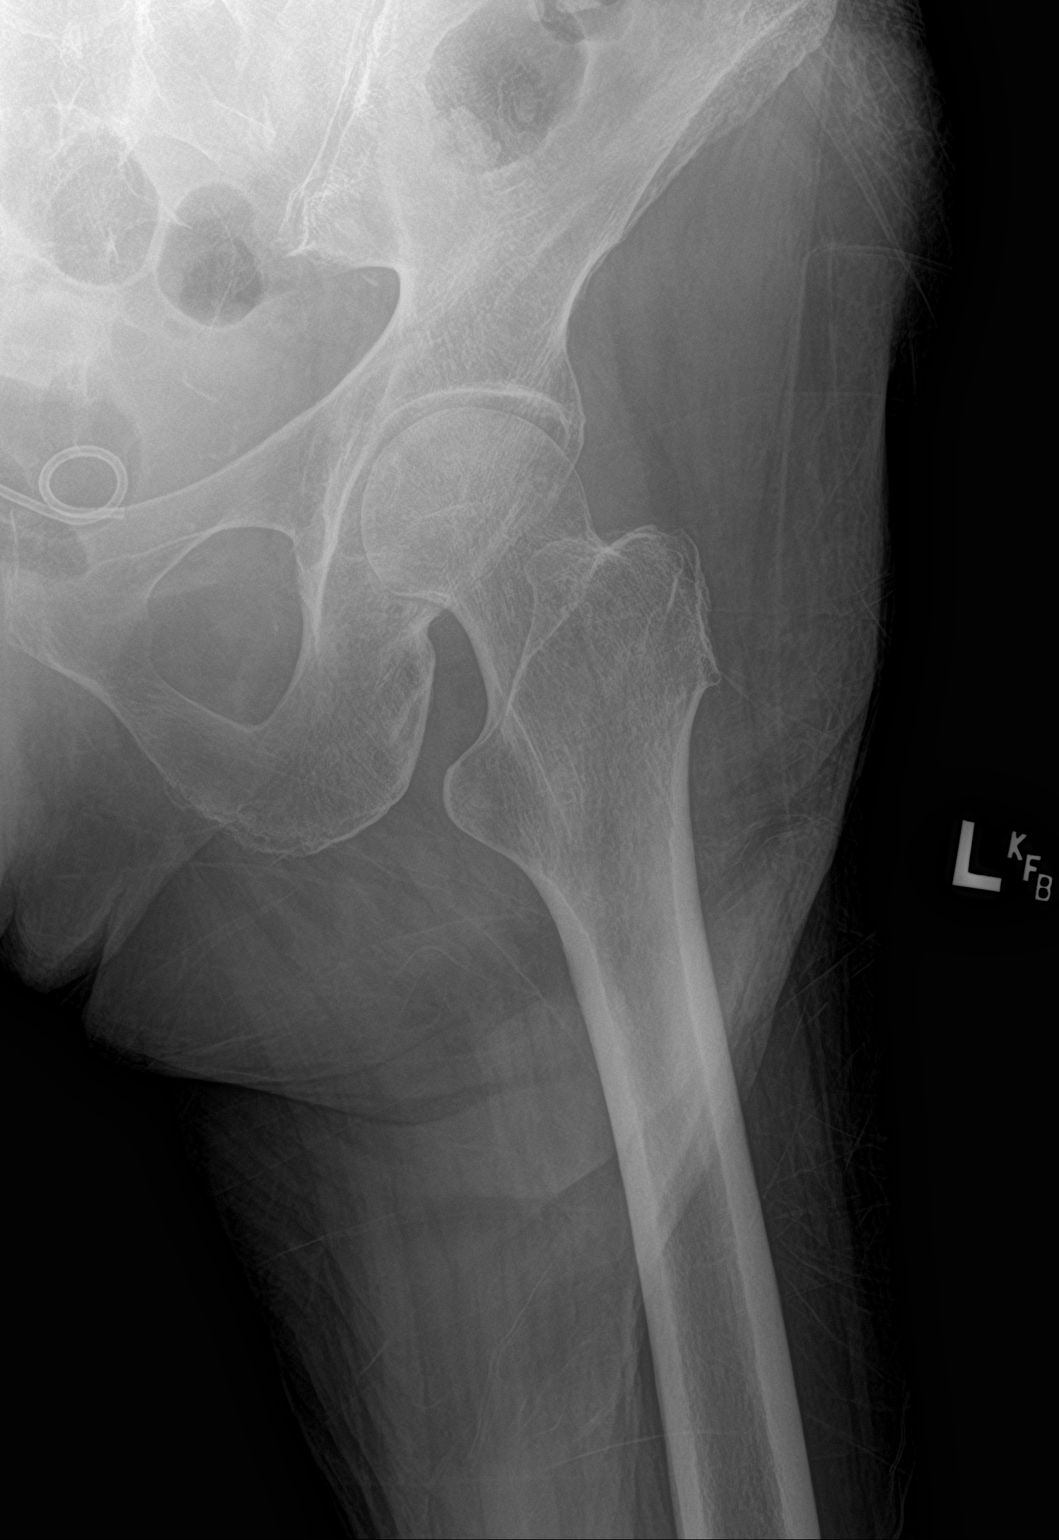

[pelvis ap]
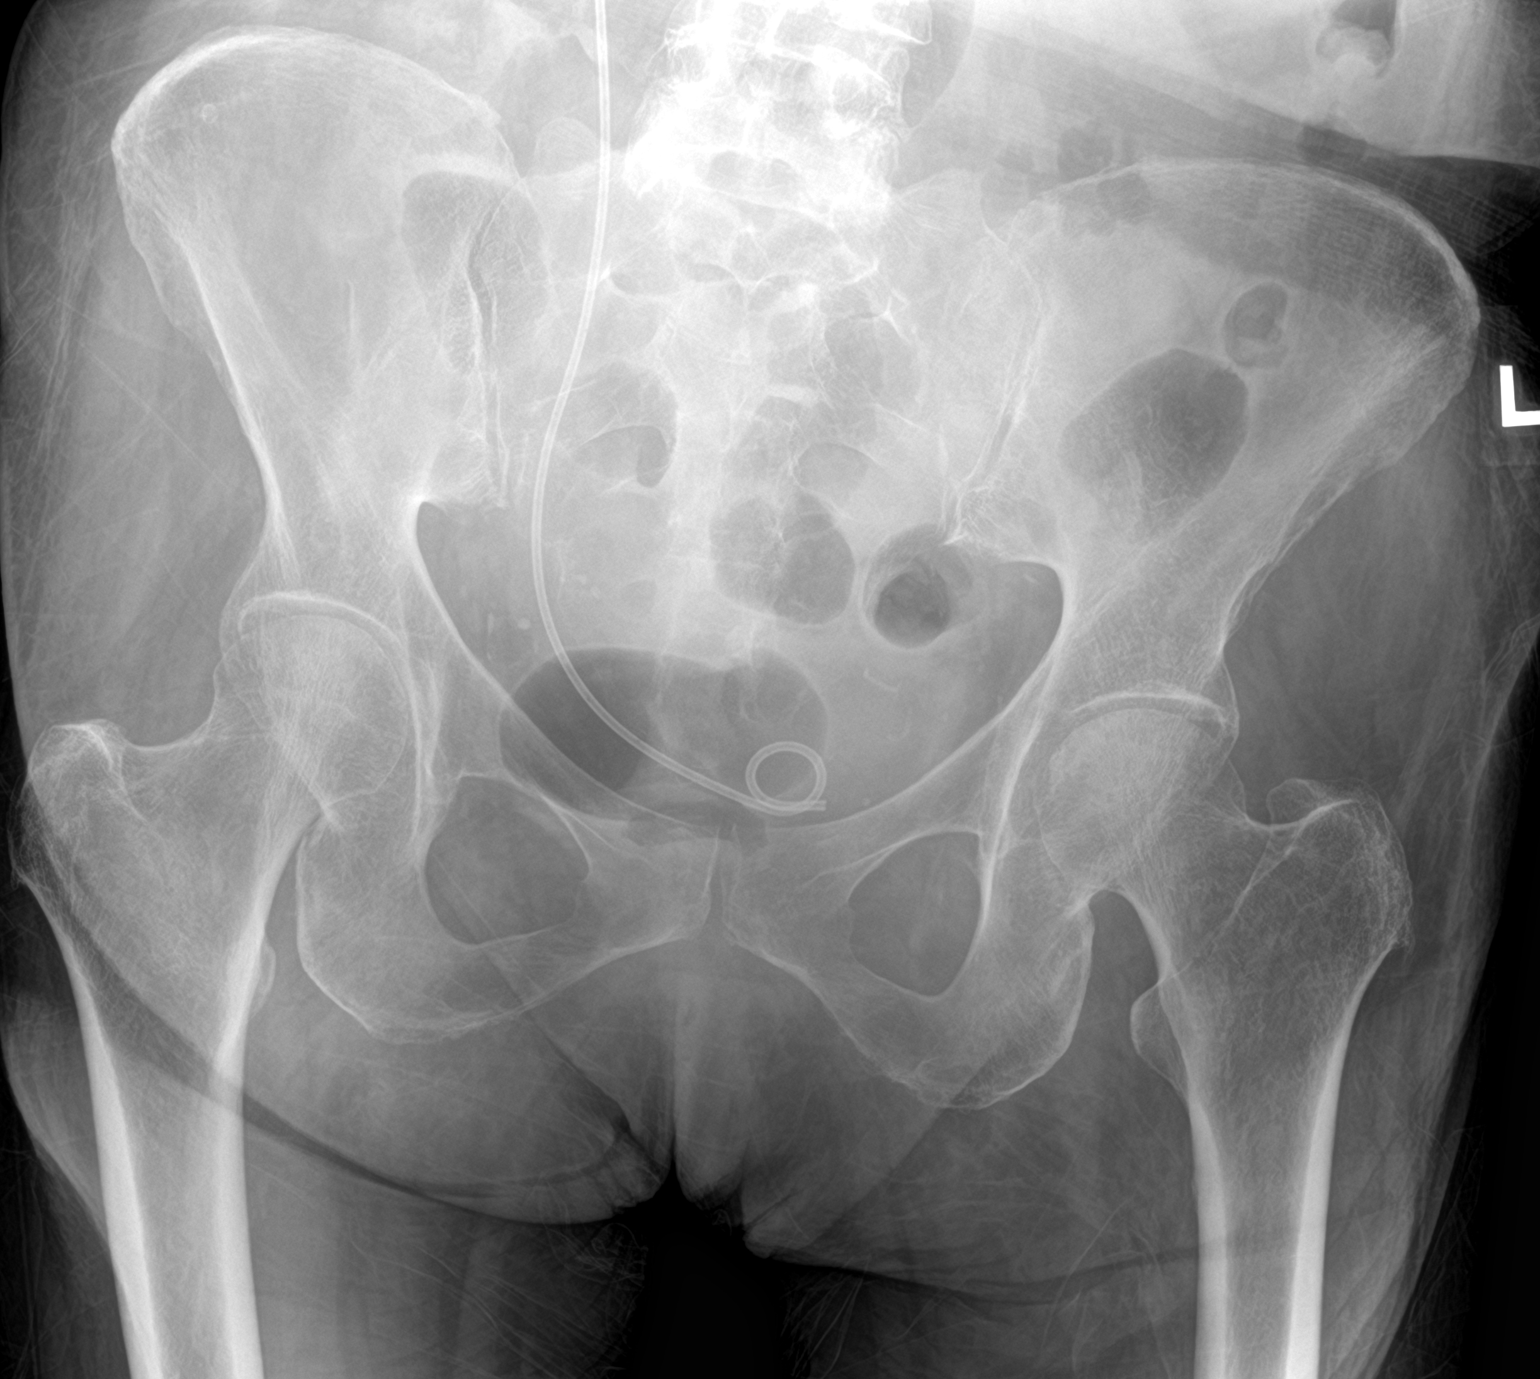

[3 of 3 positions shown; findings below may reference images not displayed]

FINDINGS: There is no evidence of hip fracture or dislocation. There is no
evidence of arthropathy or other focal bone abnormality. Osteopenia.
RIGHT nephroureteral stent with proximal retaining loop projecting
central in pelvis.
IMPRESSION: Negative.

If there is high clinical suspicion for occult hip fracture or the
patient refuses to bear weight, consider further evaluation with
MRI. Although CT is expeditious, evidence is lacking regarding
accuracy of CT over plain film radiography.

## 2020-07-11 IMAGING — CT CT CERVICAL SPINE W/O CM
5 of 8 series · 13 of 33 positions shown, 14 images · non-contrast
Comparison: Head CT 05/22/2018. Brain MRI, head and neck MRA
12/29/2017.

CLINICAL DATA: 62-year-old female status post fall backwards this
morning striking head on floor.

EXAM:
CT HEAD WITHOUT CONTRAST
CT CERVICAL SPINE WITHOUT CONTRAST
TECHNIQUE: Multidetector CT imaging of the head and cervical spine was
performed following the standard protocol without intravenous
contrast. Multiplanar CT image reconstructions of the cervical spine
were also generated.

[Series 4: head bone · axial · 0.44mm/px · z∈[-102,-50]mm · 2 of 80 slices shown]
[im 27/80  bone]
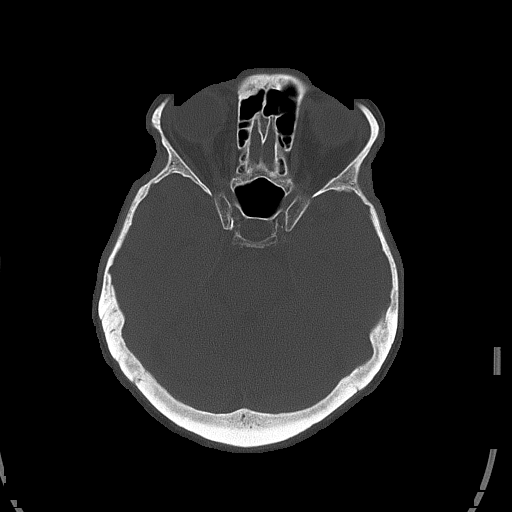
[im 53/80  bone]
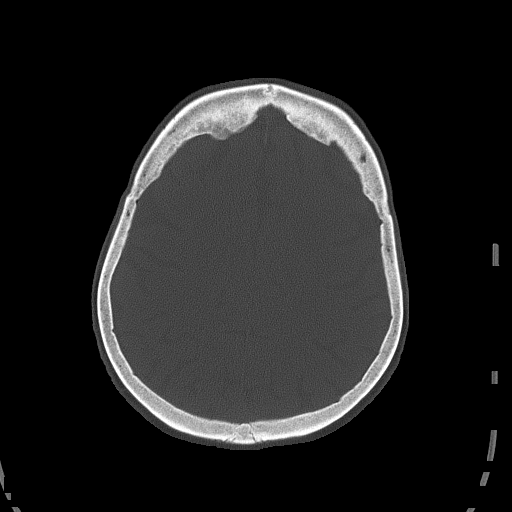

[Series 5: head without cor · coronal · non-contrast · 0.31mm/px · 3 of 67 slices shown]
[im 17/67  bone]
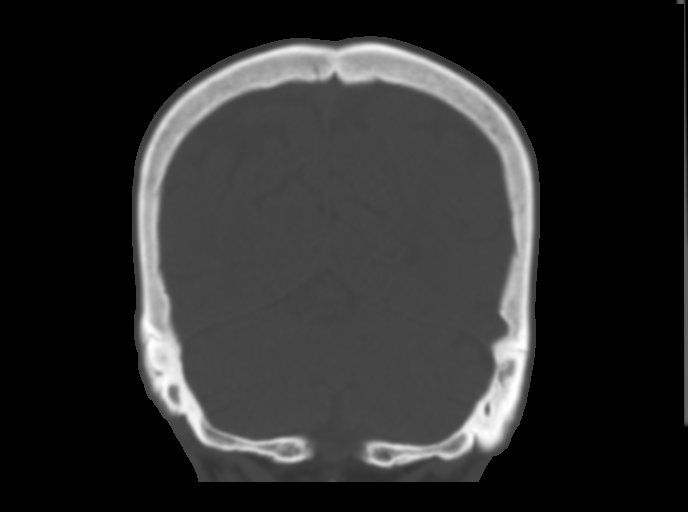
[im 34/67  bone]
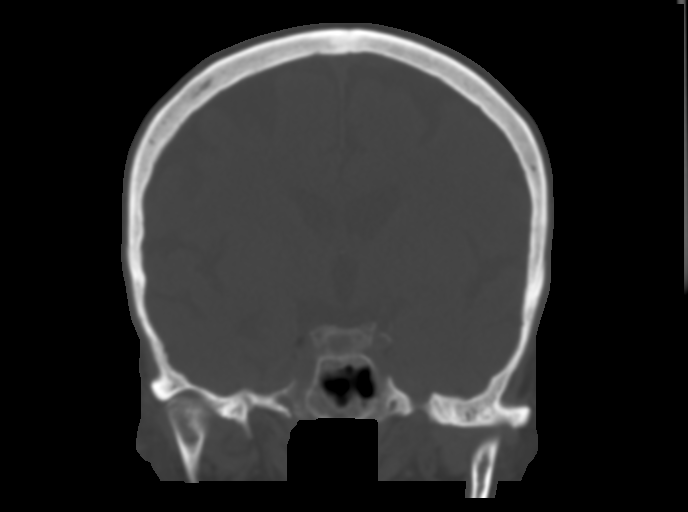
[im 50/67  bone]
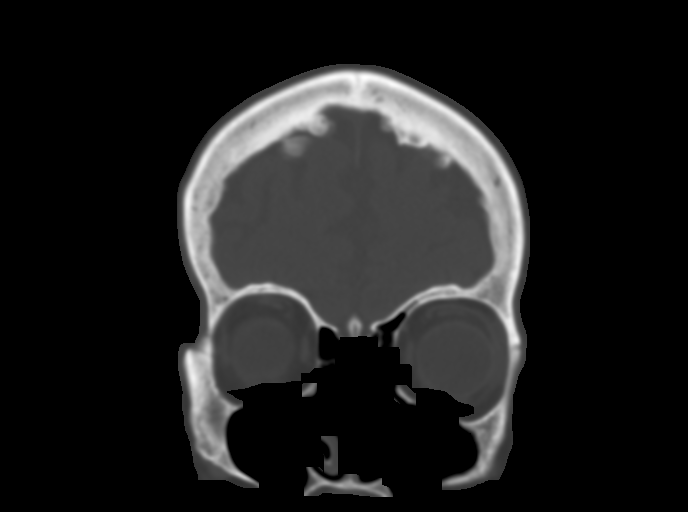

[Series 7: c_spine 2.0 st · axial · 0.32mm/px · z∈[-224,-170]mm · 2 of 82 slices shown, 3 images]
[im 28/82  soft-tissue]
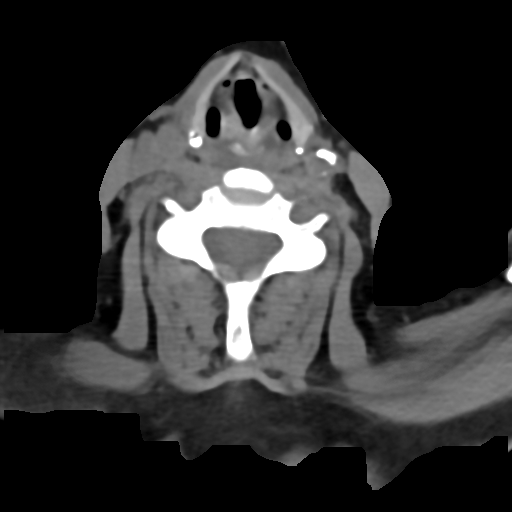
[im 28/82  bone]
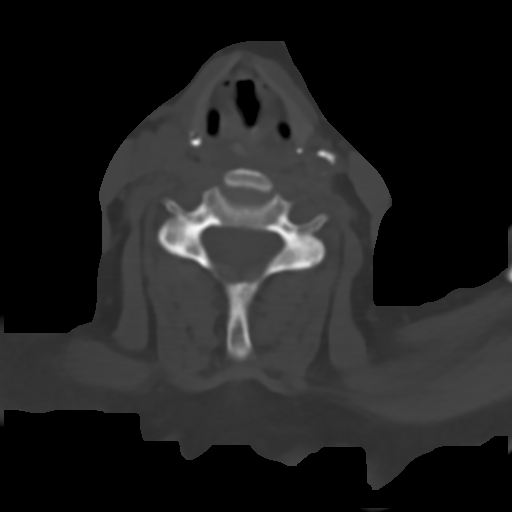
[im 55/82  bone]
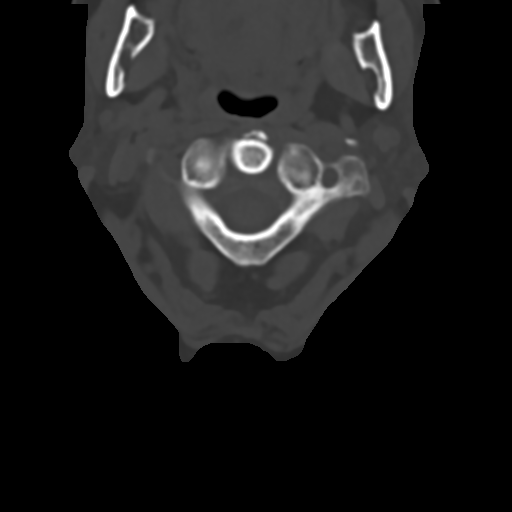

[Series 9: c_spine 2.0 sag bone · sagittal · 0.28mm/px · 4 of 61 slices shown]
[im 13/61  bone]
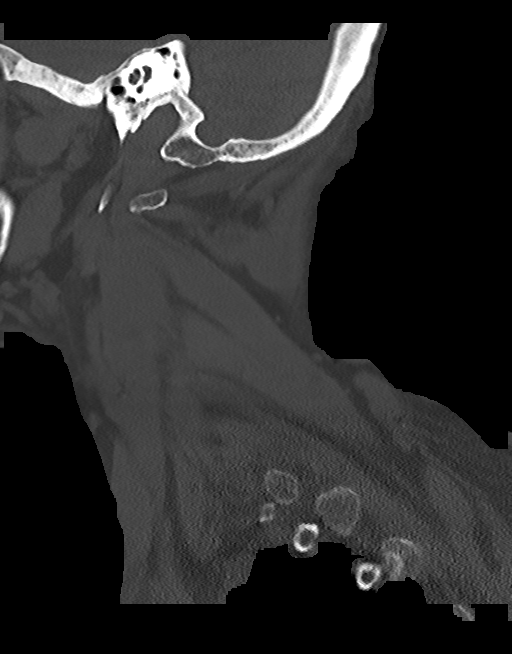
[im 25/61  bone]
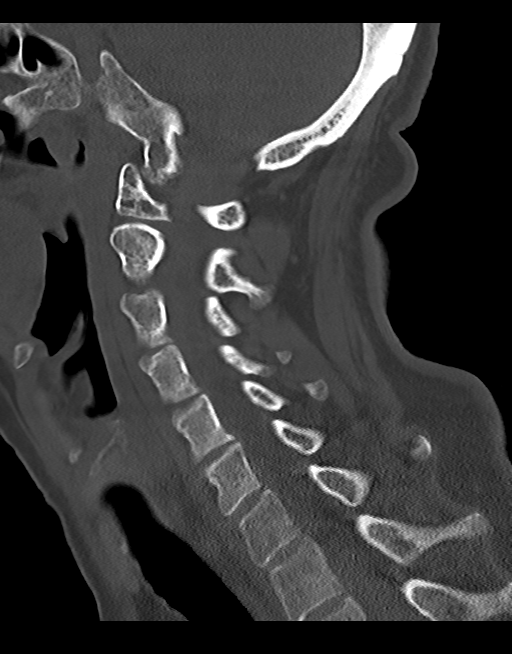
[im 37/61  bone]
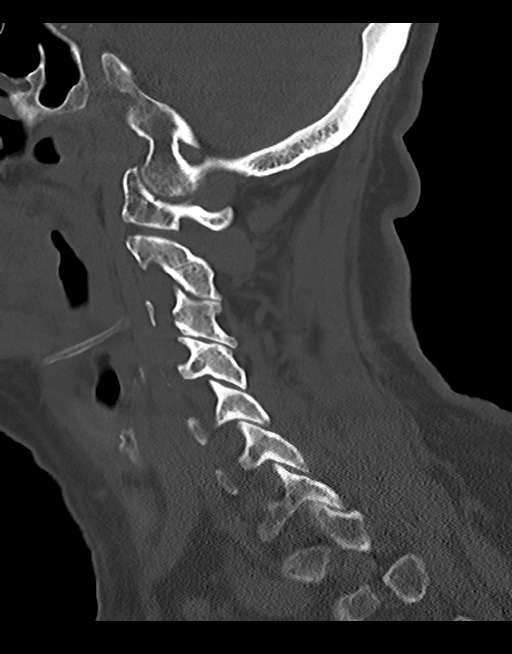
[im 49/61  bone]
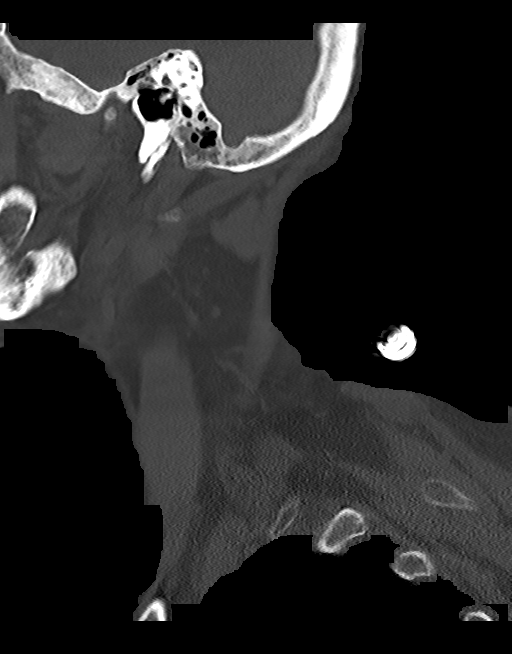

[Series 12: c_spine 2.0 orthogonals · axial · 0.21mm/px · z∈[-248,-200]mm · 2 of 79 slices shown]
[im 27/79  bone]
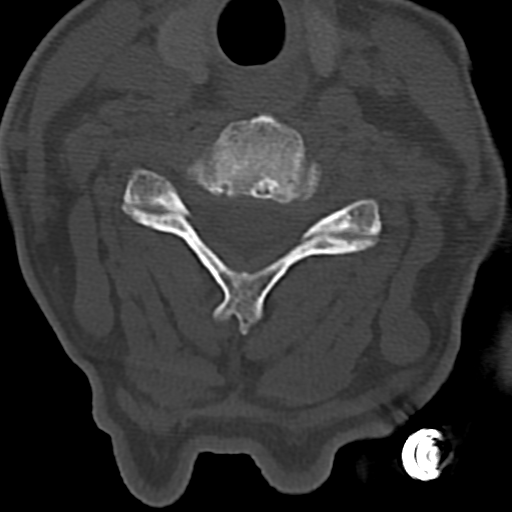
[im 53/79  bone]
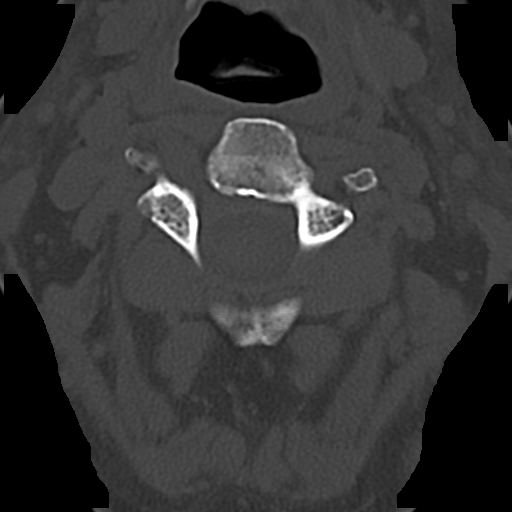

[13 of 33 positions shown; findings below may reference images not displayed]

FINDINGS: CT HEAD FINDINGS

Brain: No midline shift, ventriculomegaly, mass effect, evidence of
mass lesion, intracranial hemorrhage or evidence of cortically based
acute infarction. Gray-white matter differentiation is within normal
limits throughout the brain.

Vascular: Calcified atherosclerosis at the skull base.

Skull: Stable and intact.

Sinuses/Orbits: Visualized paranasal sinuses and mastoids are stable
and well pneumatized.

Other: No scalp soft tissue injury identified. Negative orbits.

CT CERVICAL SPINE FINDINGS

Alignment: Relatively preserved cervical lordosis. Cervicothoracic
junction alignment is within normal limits. Bilateral posterior
element alignment is within normal limits.

Skull base and vertebrae: Visualized skull base is intact. No
atlanto-occipital dissociation. No cervical spine fracture
identified. Small C7 cervical ribs (normal variant).

Soft tissues and spinal canal: No prevertebral fluid or swelling. No
visible canal hematoma. Negative noncontrast neck soft tissues aside
from calcified carotid atherosclerosis.

Disc levels: Mild to moderate upper cervical facet hypertrophy
primarily on the left. Lower cervical disc and endplate
degeneration, mild if any lower cervical spinal stenosis suspected.

Upper chest: Visible upper thoracic levels appear intact. Negative
lung apices.
IMPRESSION: 1. No acute traumatic injury identified in the head or cervical
spine.
2. Stable and normal for age non contrast CT appearance of the
brain.

## 2020-07-11 IMAGING — DX DG CHEST 2V
3 series · 3 of 3 positions shown · non-contrast
Comparison: 05/22/2018, 12/28/2017

CLINICAL DATA: Syncope

EXAM:
CHEST - 2 VIEW

[w chest pa]
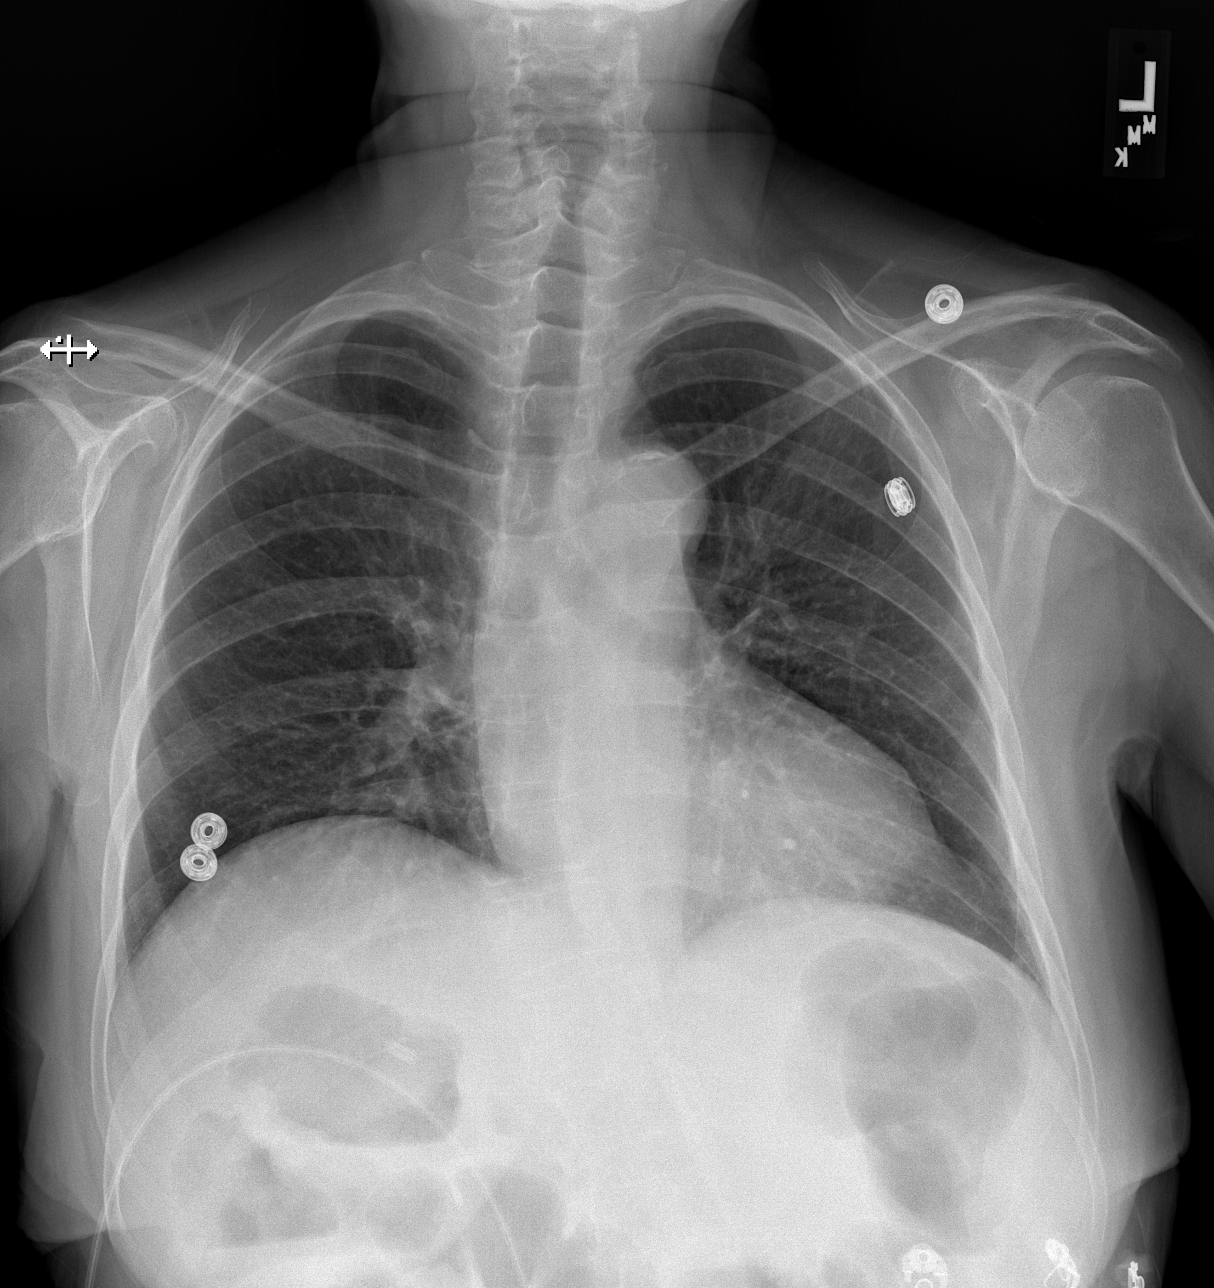

[w chest lat (1 of 2)]
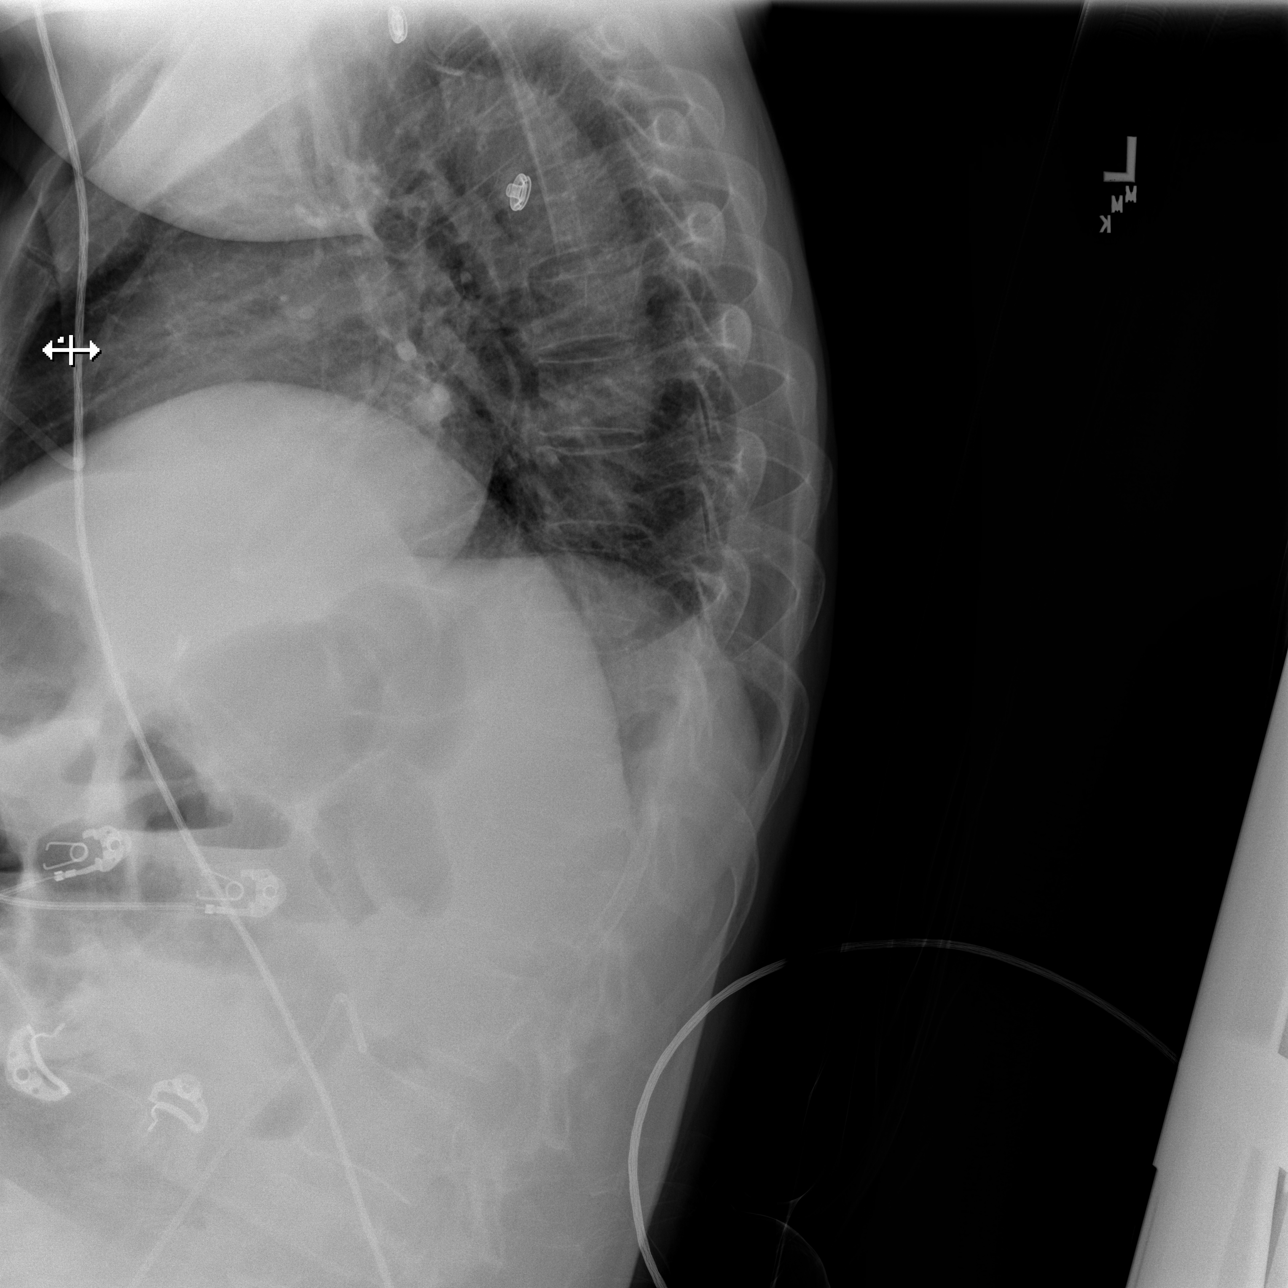

[w chest lat (2 of 2)]
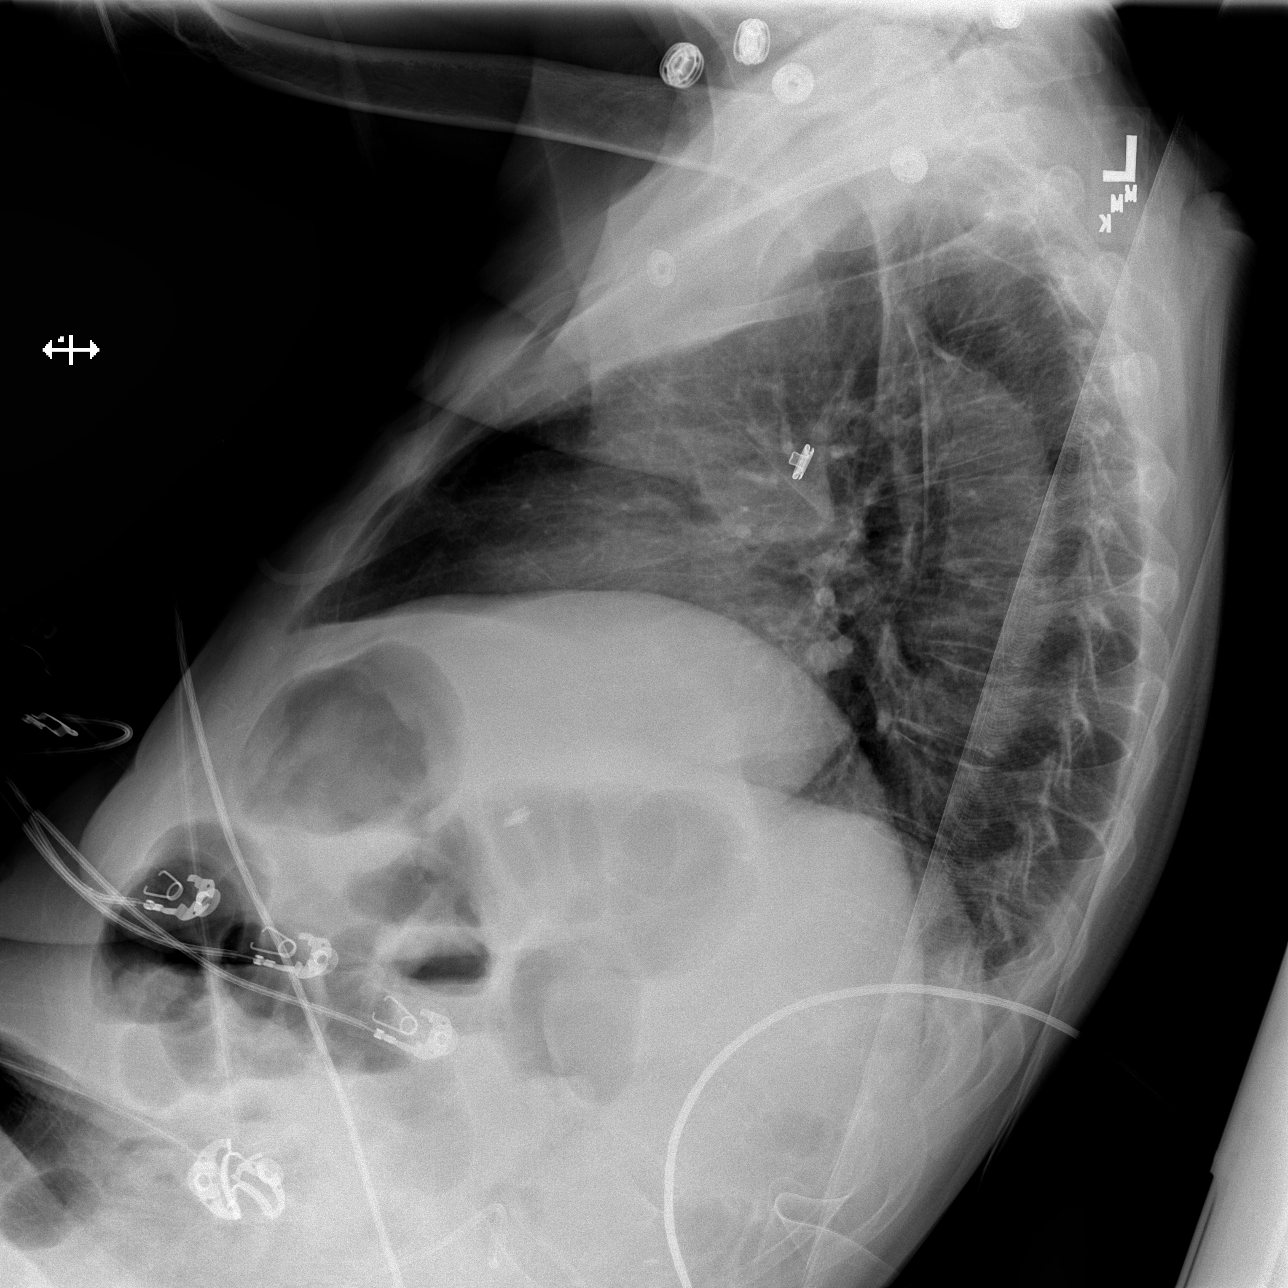

[3 of 3 positions shown; findings below may reference images not displayed]

FINDINGS: No acute opacity or pleural effusion. Cardiomediastinal silhouette
within normal limits. Aortic atherosclerosis. No pneumothorax.
IMPRESSION: No active cardiopulmonary disease.

## 2020-07-11 IMAGING — MR MR HIP*L* W/O CM
5 series · 36 of 40 positions shown · non-contrast
Comparison: Pelvis and left hip radiographs obtained earlier today.
Abdomen and pelvis CT obtained earlier today.

CLINICAL DATA: Left hip pain following 3 recent falls. No hip
fracture visible on plain radiographs or CT earlier today.

EXAM:
MR OF THE LEFT HIP WITHOUT CONTRAST
TECHNIQUE: Multiplanar, multisequence MR imaging was performed. No intravenous
contrast was administered.

[Series 15: T2 fat-sat · coronal · left · 4.0mm · 1.00mm/px · 8 of 22 slices shown (1 of 2)]
[im 1/22]
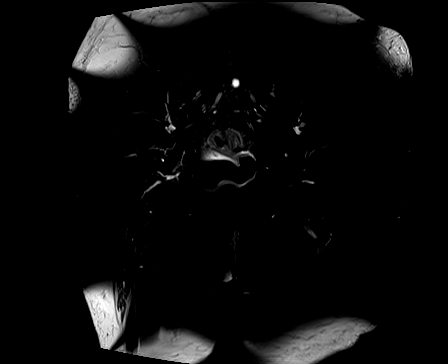
[im 4/22]
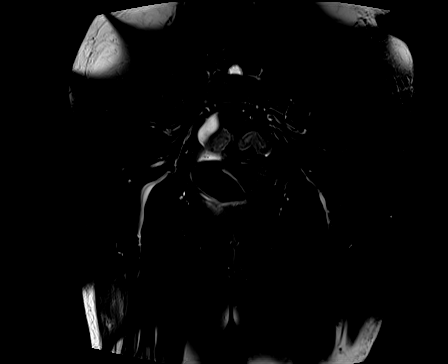
[im 7/22]
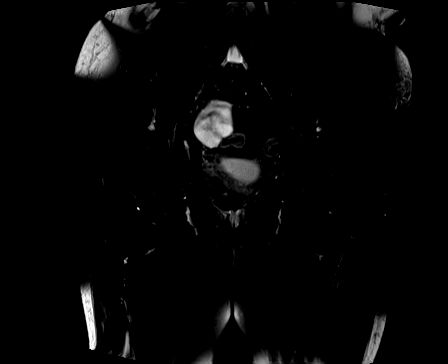
[im 10/22]
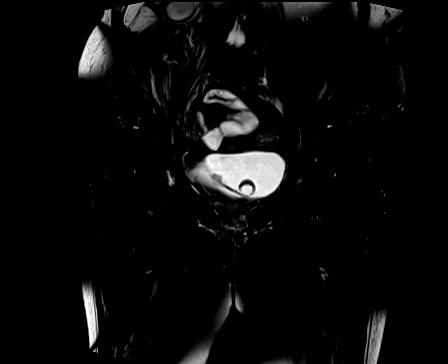
[im 13/22]
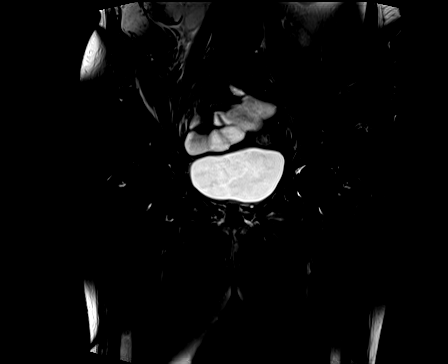
[im 16/22]
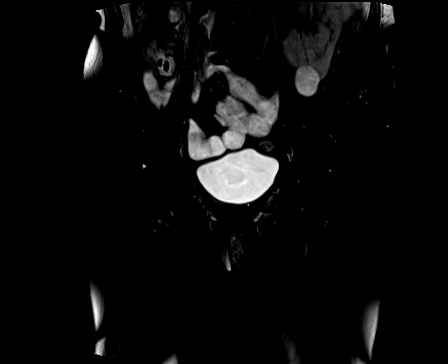
[im 19/22]
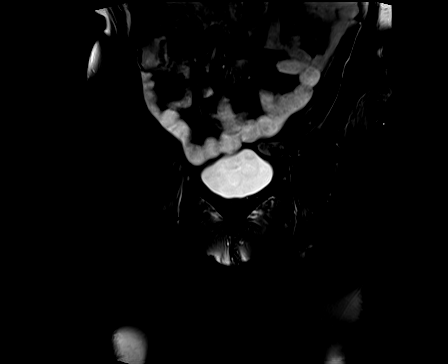
[im 22/22]
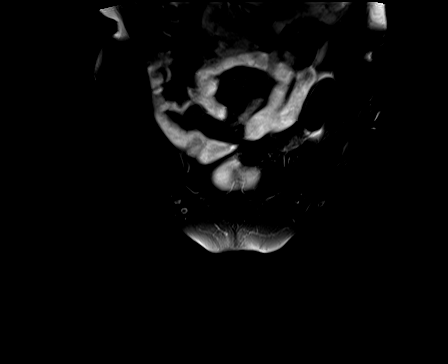

[Series 16: T1 · coronal · left · 4.0mm · 0.80mm/px · 6 of 22 slices shown]
[im 1/22]
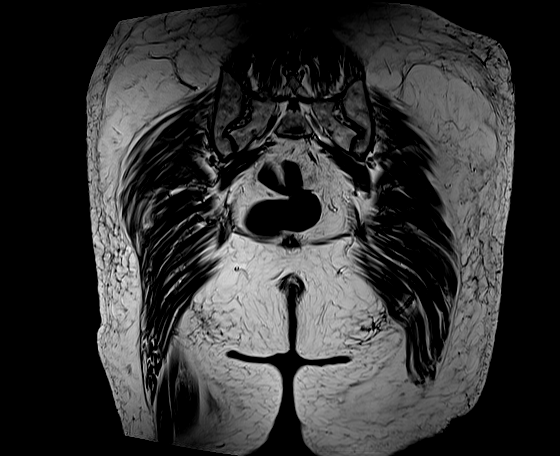
[im 4/22]
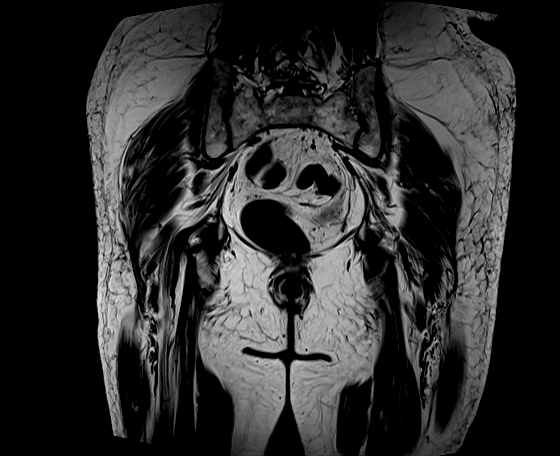
[im 7/22]
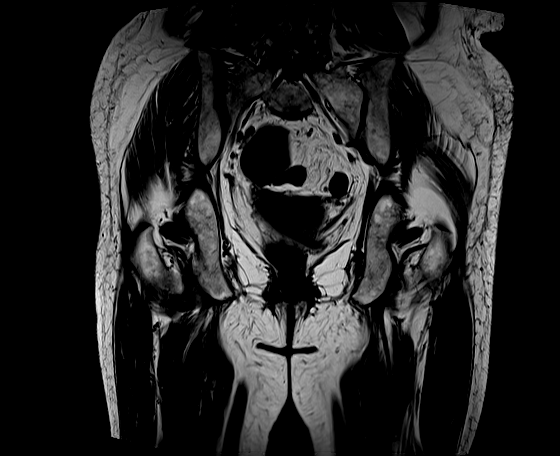
[im 10/22]
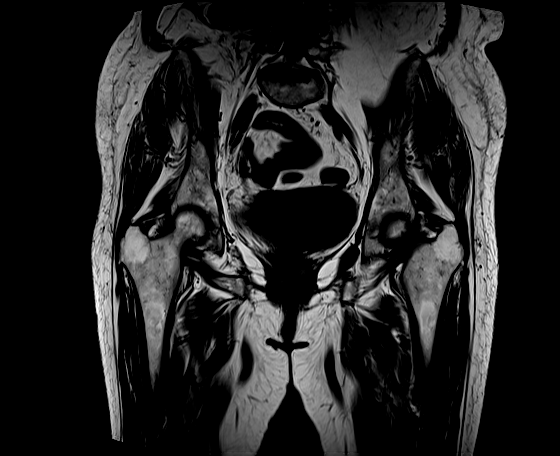
[im 13/22]
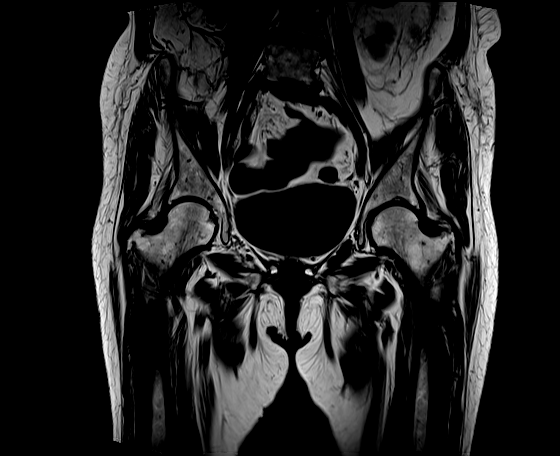
[im 16/22]
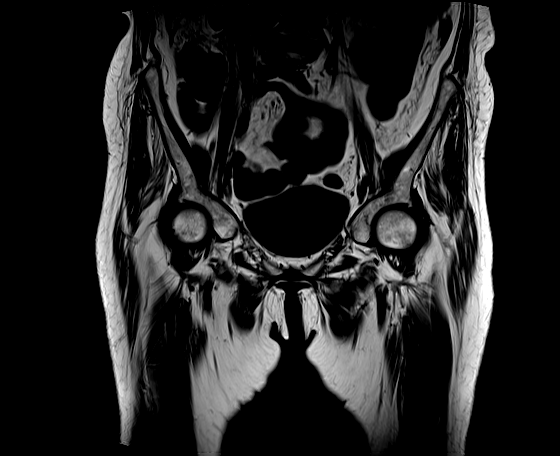

[Series 18: PD fat-sat · sagittal · left · 4.0mm · 0.56mm/px · 7 of 21 slices shown (1 of 2)]
[im 1/21]
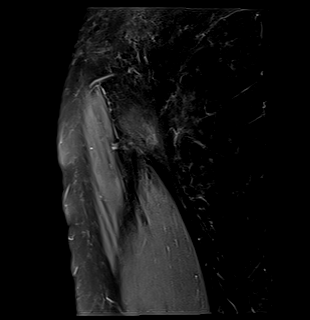
[im 4/21]
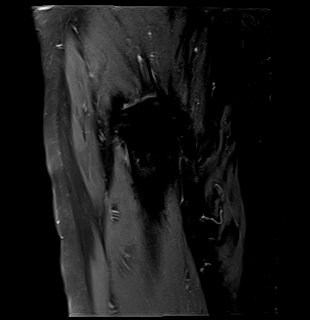
[im 7/21]
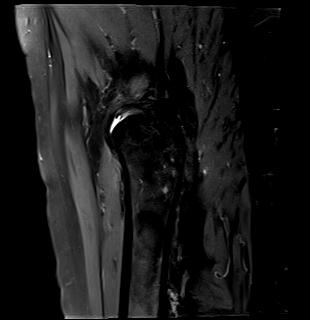
[im 11/21]
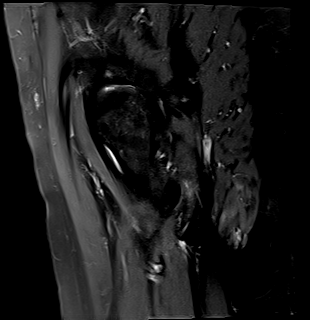
[im 14/21]
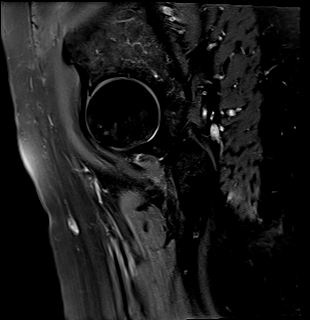
[im 17/21]
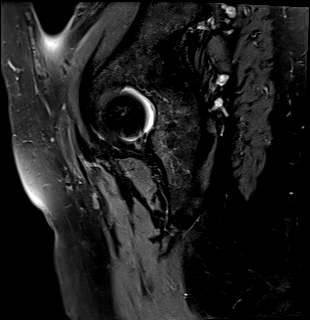
[im 21/21]
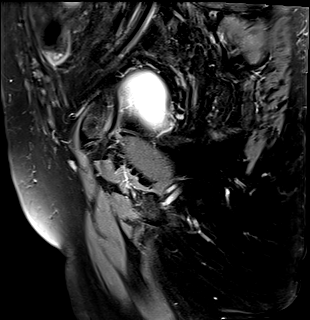

[Series 19: PD fat-sat · coronal · left · 3.0mm · 0.70mm/px · 7 of 20 slices shown (2 of 2)]
[im 1/20]
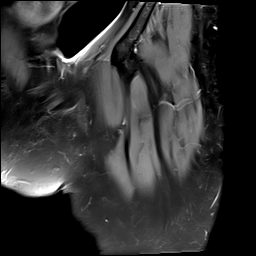
[im 4/20]
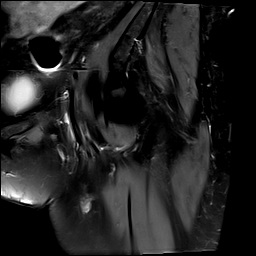
[im 7/20]
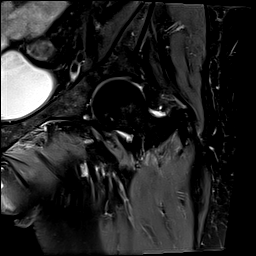
[im 10/20]
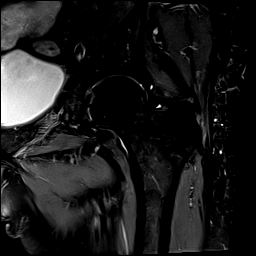
[im 13/20]
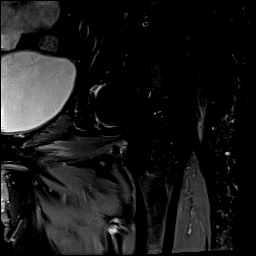
[im 16/20]
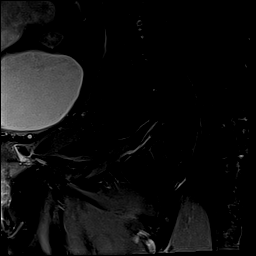
[im 20/20]
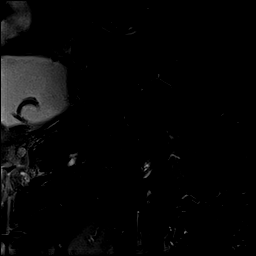

[Series 20: T2 fat-sat · axial · left · 4.0mm · 0.43mm/px · z∈[-147,-8]mm · 8 of 29 slices shown (2 of 2)]
[im 1/29]
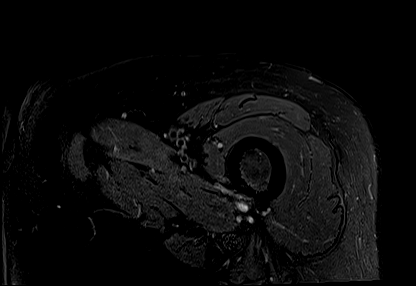
[im 4/29]
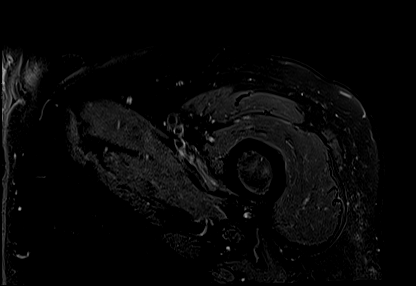
[im 10/29]
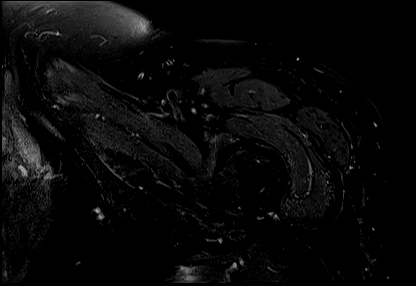
[im 13/29]
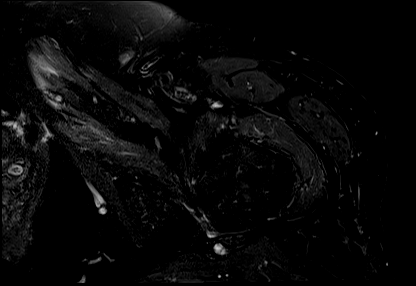
[im 16/29]
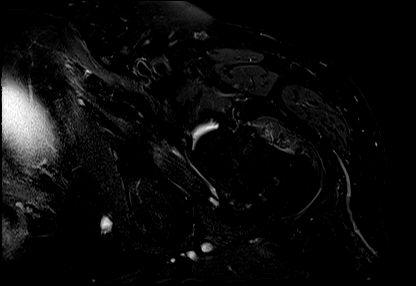
[im 19/29]
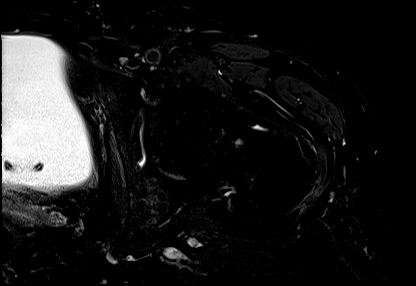
[im 25/29]
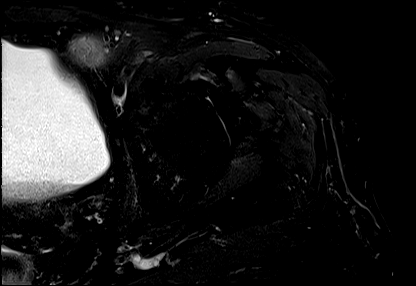
[im 29/29]
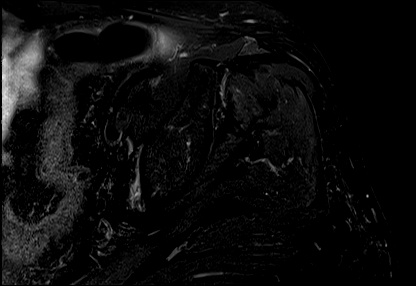

[36 of 40 positions shown; findings below may reference images not displayed]

FINDINGS: Normal appearing left hip with no fracture, dislocation or effusion.
No surrounding or adjacent soft tissue edema. The right hip also has
a normal appearance as do the pelvic bones. Mild lower lumbar spine
dextroconvex scoliosis. Distal portion of a right ureteral stent in
the urinary bladder. Unremarkable bowel loops. Previously noted
mass-like densities in the retroperitoneal space on the right,
inferior to the right kidney and adjacent to the right psoas muscle.
IMPRESSION: 1. Normal appearing left hip without fracture or dislocation.
2. Previously noted retroperitoneal mass-like densities inferior to
the right kidney and adjacent to the right psoas muscle. These have
MR signal characteristics similar to fluid in adjacent fluid-filled
small bowel loops, suggesting that these may represent seromas. Soft
tissue masses cannot be excluded. As previously discussed, these can
be re-evaluated with a follow-up CT with contrast.

## 2020-07-13 ENCOUNTER — Encounter: Payer: Medicare HMO | Admitting: Pain Medicine

## 2020-07-15 ENCOUNTER — Telehealth: Payer: Self-pay | Admitting: Primary Care

## 2020-07-15 ENCOUNTER — Telehealth: Payer: Self-pay

## 2020-07-15 NOTE — Chronic Care Management (AMB) (Signed)
Patient was contacted by [employer] scheduler, Hilario Quarry, on 07/15/20 at 11:45 AM for CCM enrollment. Patient was breathing heavily and reported worsening symptoms of shortness of breath, lack of taste, wet cough, yellow mucous, fever last night, and feeling like she has pneumonia. Abigail instructed patient to go to the emergency room but patient reports she is going to stay home due to lack of transportation. Alerting PCP and front office staff for follow up.  Debbora Dus, PharmD Clinical Pharmacist [facility] [phone]

## 2020-07-15 NOTE — Progress Notes (Signed)
  Chronic Care Management   Outreach Note  07/15/2020 Name: [name] MRN: [id number] DOB: 09/03/1956  Referred by: [name], NP Reason for referral : Chronic Care Management   An unsuccessful telephone outreach was attempted today. The patient was referred to the pharmacist for assistance with care management and care coordination.   Follow Up Plan:   Hilario Quarry  [employer] Scheduler

## 2020-07-16 ENCOUNTER — Emergency Department: Payer: Medicare HMO

## 2020-07-16 ENCOUNTER — Observation Stay
Admission: EM | Admit: 2020-07-16 | Discharge: 2020-07-18 | Disposition: A | Discharge status: Home / Self Care | Payer: Medicare HMO | Attending: Family Medicine | Admitting: Family Medicine

## 2020-07-16 ENCOUNTER — Other Ambulatory Visit: Payer: Self-pay

## 2020-07-16 DIAGNOSIS — R0602 Shortness of breath: Secondary | ICD-10-CM | POA: Diagnosis not present

## 2020-07-16 DIAGNOSIS — Z87891 Personal history of nicotine dependence: Secondary | ICD-10-CM | POA: Diagnosis not present

## 2020-07-16 DIAGNOSIS — I700 Atherosclerosis of aorta: Secondary | ICD-10-CM | POA: Diagnosis not present

## 2020-07-16 DIAGNOSIS — B372 Candidiasis of skin and nail: Secondary | ICD-10-CM | POA: Diagnosis not present

## 2020-07-16 DIAGNOSIS — I129 Hypertensive chronic kidney disease with stage 1 through stage 4 chronic kidney disease, or unspecified chronic kidney disease: Secondary | ICD-10-CM | POA: Diagnosis not present

## 2020-07-16 DIAGNOSIS — Z79899 Other long term (current) drug therapy: Secondary | ICD-10-CM | POA: Diagnosis not present

## 2020-07-16 DIAGNOSIS — Z8673 Personal history of transient ischemic attack (TIA), and cerebral infarction without residual deficits: Secondary | ICD-10-CM | POA: Diagnosis not present

## 2020-07-16 DIAGNOSIS — F32A Depression, unspecified: Secondary | ICD-10-CM | POA: Insufficient documentation

## 2020-07-16 DIAGNOSIS — I4891 Unspecified atrial fibrillation: Secondary | ICD-10-CM | POA: Diagnosis not present

## 2020-07-16 DIAGNOSIS — I2510 Atherosclerotic heart disease of native coronary artery without angina pectoris: Secondary | ICD-10-CM | POA: Diagnosis not present

## 2020-07-16 DIAGNOSIS — N1831 Chronic kidney disease, stage 3a: Secondary | ICD-10-CM | POA: Diagnosis not present

## 2020-07-16 DIAGNOSIS — E785 Hyperlipidemia, unspecified: Secondary | ICD-10-CM | POA: Insufficient documentation

## 2020-07-16 DIAGNOSIS — E1140 Type 2 diabetes mellitus with diabetic neuropathy, unspecified: Secondary | ICD-10-CM | POA: Diagnosis not present

## 2020-07-16 DIAGNOSIS — U071 COVID-19: Secondary | ICD-10-CM | POA: Diagnosis not present

## 2020-07-16 DIAGNOSIS — E039 Hypothyroidism, unspecified: Secondary | ICD-10-CM | POA: Insufficient documentation

## 2020-07-16 DIAGNOSIS — E782 Mixed hyperlipidemia: Secondary | ICD-10-CM

## 2020-07-16 DIAGNOSIS — J9811 Atelectasis: Secondary | ICD-10-CM | POA: Diagnosis not present

## 2020-07-16 DIAGNOSIS — R059 Cough, unspecified: Secondary | ICD-10-CM

## 2020-07-16 DIAGNOSIS — Z8542 Personal history of malignant neoplasm of other parts of uterus: Secondary | ICD-10-CM | POA: Insufficient documentation

## 2020-07-16 DIAGNOSIS — Z794 Long term (current) use of insulin: Secondary | ICD-10-CM | POA: Diagnosis not present

## 2020-07-16 DIAGNOSIS — Z20822 Contact with and (suspected) exposure to covid-19: Secondary | ICD-10-CM | POA: Insufficient documentation

## 2020-07-16 DIAGNOSIS — J189 Pneumonia, unspecified organism: Secondary | ICD-10-CM

## 2020-07-16 DIAGNOSIS — I480 Paroxysmal atrial fibrillation: Secondary | ICD-10-CM | POA: Diagnosis not present

## 2020-07-16 LAB — BASIC METABOLIC PANEL
Anion gap: 11 (ref 5–15)
BUN: 13 mg/dL (ref 8–23)
CO2: 21 mmol/L — ABNORMAL LOW (ref 22–32)
Calcium: 8.3 mg/dL — ABNORMAL LOW (ref 8.9–10.3)
Chloride: 97 mmol/L — ABNORMAL LOW (ref 98–111)
Creatinine, Ser: 1.34 mg/dL — ABNORMAL HIGH (ref 0.44–1.00)
GFR, Estimated: 44 mL/min — ABNORMAL LOW (ref >60)
Glucose, Bld: 362 mg/dL — ABNORMAL HIGH (ref 70–99)
Potassium: 3.6 mmol/L (ref 3.5–5.1)
Sodium: 129 mmol/L — ABNORMAL LOW (ref 135–145)

## 2020-07-16 LAB — TROPONIN I (HIGH SENSITIVITY)
Troponin I (High Sensitivity): 6 ng/L (ref <18)
Troponin I (High Sensitivity): 6 ng/L (ref <18)

## 2020-07-16 LAB — RESPIRATORY PANEL BY RT PCR (FLU A&B, COVID)
Influenza A by PCR: NEGATIVE
Influenza B by PCR: NEGATIVE
SARS Coronavirus 2 by RT PCR: NEGATIVE

## 2020-07-16 LAB — CBC
HCT: 35.2 % — ABNORMAL LOW (ref 36.0–46.0)
Hemoglobin: 10.7 g/dL — ABNORMAL LOW (ref 12.0–15.0)
MCH: 25.1 pg — ABNORMAL LOW (ref 26.0–34.0)
MCHC: 30.4 g/dL (ref 30.0–36.0)
MCV: 82.4 fL (ref 80.0–100.0)
Platelets: 139 10*3/uL — ABNORMAL LOW (ref 150–400)
RBC: 4.27 MIL/uL (ref 3.87–5.11)
RDW: 17.2 % — ABNORMAL HIGH (ref 11.5–15.5)
WBC: 10.2 10*3/uL (ref 4.0–10.5)
nRBC: 0 % (ref 0.0–0.2)

## 2020-07-16 MED ORDER — SODIUM CHLORIDE 0.9 % IV SOLN: INTRAVENOUS | Status: AC

## 2020-07-16 MED ORDER — OXYCODONE HCL 5 MG PO TABS: 5.0000 mg | ORAL_TABLET | ORAL | Status: DC | PRN

## 2020-07-16 MED ORDER — NITROGLYCERIN 0.4 MG SL SUBL: 0.4000 mg | SUBLINGUAL_TABLET | SUBLINGUAL | Status: DC | PRN

## 2020-07-16 MED ORDER — ATORVASTATIN CALCIUM 20 MG PO TABS
40.0000 mg | ORAL_TABLET | Freq: Every day | ORAL | Status: DC
  Administered 2020-07-17: 40 mg via ORAL
  Filled 2020-07-16: qty 2

## 2020-07-16 MED ORDER — CYCLOBENZAPRINE HCL 10 MG PO TABS: 10.0000 mg | ORAL_TABLET | Freq: Three times a day (TID) | ORAL | Status: DC | PRN

## 2020-07-16 MED ORDER — SERTRALINE HCL 50 MG PO TABS
50.0000 mg | ORAL_TABLET | Freq: Every day | ORAL | Status: DC
  Administered 2020-07-17 – 2020-07-18 (×2): 50 mg via ORAL
  Filled 2020-07-16 (×2): qty 1

## 2020-07-16 MED ORDER — LACTATED RINGERS IV BOLUS
1000.0000 mL | Freq: Once | INTRAVENOUS | Status: AC
  Administered 2020-07-16: 1000 mL via INTRAVENOUS

## 2020-07-16 MED ORDER — HYDROCOD POLST-CPM POLST ER 10-8 MG/5ML PO SUER
5.0000 mL | Freq: Every day | ORAL | Status: DC
  Administered 2020-07-17 (×2): 5 mL via ORAL
  Filled 2020-07-16 (×2): qty 5

## 2020-07-16 MED ORDER — ENOXAPARIN SODIUM 40 MG/0.4ML ~~LOC~~ SOLN
40.0000 mg | SUBCUTANEOUS | Status: DC
  Administered 2020-07-17: 40 mg via SUBCUTANEOUS

## 2020-07-16 MED ORDER — ACETAMINOPHEN 325 MG PO TABS
650.0000 mg | ORAL_TABLET | Freq: Four times a day (QID) | ORAL | Status: DC | PRN
  Administered 2020-07-18: 650 mg via ORAL
  Filled 2020-07-16: qty 2

## 2020-07-16 MED ORDER — BENZONATATE 100 MG PO CAPS
200.0000 mg | ORAL_CAPSULE | Freq: Two times a day (BID) | ORAL | Status: DC
  Administered 2020-07-17: 200 mg via ORAL
  Filled 2020-07-16: qty 2

## 2020-07-16 MED ORDER — INSULIN DETEMIR 100 UNIT/ML ~~LOC~~ SOLN
30.0000 [IU] | Freq: Two times a day (BID) | SUBCUTANEOUS | Status: DC
  Administered 2020-07-17 (×2): 30 [IU] via SUBCUTANEOUS
  Filled 2020-07-16 (×6): qty 0.3

## 2020-07-16 MED ORDER — NYSTATIN 100000 UNIT/GM EX POWD
Freq: Three times a day (TID) | CUTANEOUS | Status: DC
  Filled 2020-07-16 (×2): qty 15

## 2020-07-16 MED ORDER — MORPHINE SULFATE (PF) 2 MG/ML IV SOLN
2.0000 mg | Freq: Once | INTRAVENOUS | Status: AC
  Administered 2020-07-16: 2 mg via INTRAVENOUS
  Filled 2020-07-16: qty 1

## 2020-07-16 MED ORDER — ACETAMINOPHEN 650 MG RE SUPP: 650.0000 mg | Freq: Four times a day (QID) | RECTAL | Status: DC | PRN

## 2020-07-16 MED ORDER — METOPROLOL TARTRATE 25 MG PO TABS
25.0000 mg | ORAL_TABLET | Freq: Two times a day (BID) | ORAL | Status: DC
  Administered 2020-07-17 – 2020-07-18 (×3): 25 mg via ORAL
  Filled 2020-07-16 (×3): qty 1

## 2020-07-16 NOTE — H&P (Addendum)
History and Physical   [name] IRW:[id number] DOB: Oct 19, 1955 DOA: 07/16/2020  PCP: [name], NP  Outpatient Specialists: Dr. Milinda Pointer, Pain management Patient coming from: home  I have personally briefly reviewed patient's old medical records in [place].  Chief Concern: progressively worsening sob and coughing  HPI: [name] is a 64 y.o. female with medical history significant for DM1 on levamir 30 u in AM and PM, hyperlipidemia on atorvastatin 40 mg qhs, back pain on flexeril, depression on sertraline 50 mg daily, on metoprolol tartrate for afib rate control.   She presented to ED from home for chief concern of worsening shortness of breath that started about 5 days ago. She was exposed to a friend 7 days ago and that friend later tested positive for covid.   She denied fever at home. She endorses lost of taste and smell that started 3 days prior, and dysphagia with food and liquid. She states the cough is productive and thick and tan colored.   She endorsed pressured chest pain at rest at the left upper chest wall. She also endorsed chest pain with coughing, however feels these two are different types of chest pain. She endorses weakness in her legs about 4-5 days, especially with standing up from a sitting or laying down position. She endorses feeling a little unbalanced when she does start to ambulate. This is new for her. She endorses muscle aches.   She endorses painful headache behind her eyes that causes her to tear up, and nausea with the headache. She denies vomiting.  She denies abdominal pain, dysuria, ringing in ears, fever, chills, odynophagia, diarrhea, constipation, hematuria.   Her last BM was yesterday, 07/15/20, brown and a good amount.   Social history: Lives at home with has been, no exposure to pets.  Denies tobacco use, EtOH use, recreational drug use.  She is currently disabled and formerly worked as a Research scientist (physical sciences).  ED  Course: discussed with ED provider.  Covid-like symptoms with negative Covid test and fully vaccinated for Covid.  Concerning for decompensation requiring O2 supplementation.  Review of Systems: As per HPI otherwise 10 point review of systems negative.  Assessment/Plan  Active Problems:   Atrial fibrillation (HCC)   History of TIA (transient ischemic attack)   Hyperlipidemia   CAD (coronary artery disease)   Shortness of breath   Candidal intertrigo   Shortness of breath-Covid-like symptoms, etiology likely viral infection -No wheezing on physical exam, no indications for steroids at this time and patient is not requiring oxygen supplementation -Fully vaccinated for Covid -Tested negative for Covid, influenza a/B -Admit to observation with telemetry -Repeat Covid test -Pending CTA of the chest -Chest x-ray was negative for Covid pneumonia  History of paroxysmal atrial fibrillation-on metoprolol tartrate, patient reports she is not on anticoagulation -Per [facility] note, she was previously on apixaban 5 mg twice daily in 2017 -Patient states she is currently not taking anticoagulation, and she does not know why -She states that it has been over a year since she saw her cardiologist due to Covid pandemic  Left under breast intertrigo-present on admission, nystatin powder, apply to clean dry skin twice daily  Diabetes type 1-she says she takes Levemir 30 units twice daily, no short-acting coverage -Insulin detemir 30 units twice daily ordered  Hyponatremia-suspect secondary to hyperperosmolar hyperglycemia, corrected sodium is 135, normal saline IVF at 125 cc/h  History of levothyroxine-previously on Synthroid 75 mcg daily  Hyperlipidemia-atorvastatin 40 nightly  Depression-sertraline 50  mg daily  AM team to complete medical reconciliation  DVT prophylaxis: Enoxaparin Code Status: Full code Diet: Heart healthy/carb modified Family Communication: None at this time Disposition  Plan: Pending clinical course Consults called: None at this time Admission status: Observation with telemetry  Past Medical History:  Diagnosis Date  . Acute encephalopathy 05/22/2018  . Allergy   . Anginal pain ([place])   . Anxiety   . Ascites   . C. difficile colitis 07/10/2015  . Cancer (HCC)    HX OF CANCER OF UTERUS   . Cirrhosis of liver not due to alcohol ([place]) 2016  . Degenerative disk disease   . Diverticulitis   . Gastroparesis   . GERD (gastroesophageal reflux disease)   . History of hiatal hernia   . Hypertension   . Hypothyroid   . Hypothyroidism due to amiodarone   . Ileus ([place]) 08/01/2015  . Intussusception intestine ([place]) 05/2015  . Orthostatic hypotension   . PAF (paroxysmal atrial fibrillation) ([place]) 03/2015   a. new onset 03/2015 in setting of intractable N/V; b. on Eliquis 5 mg bid; c. CHADSVASc 4 (DM, TIA x 2, female)  . Pancreatitis   . Pneumonia 11/14/2015  . Right ureteral stone 07/14/2016  . Sepsis ([place]) 12/15/2017  . Sick sinus syndrome ([place])   . Stomach ulcer   . Stroke [facility])    with minimal left sided weakness  . Syncope 01/2015  . Syncope due to orthostatic hypotension 05/18/2015  . Tachyarrhythmia 01/10/2016  . TIA (transient ischemic attack) 02/2015  . Type 1 diabetes ([place])    on levemir  . UTERINE CANCER, HX OF 03/27/2007   Qualifier: Diagnosis of  By: Maxie Better FNP, Rosalita Levan   . UTI (urinary tract infection) 05/22/2018   Past Surgical History:  Procedure Laterality Date  . ABDOMINAL HYSTERECTOMY    . CARDIAC CATHETERIZATION N/A 01/12/2016   Procedure: Left Heart Cath and Coronary Angiography;  Surgeon: Wellington Hampshire, MD;  Location: [facility];  Service: Cardiovascular;  Laterality: N/A;  . CHOLECYSTECTOMY    . CYSTOSCOPY W/ RETROGRADES Bilateral 12/11/2019   Procedure: CYSTOSCOPY WITH RETROGRADE PYELOGRAM;  Surgeon: Billey Co, MD;  Location: [facility] OR[facility];  Service: Urology;  Laterality: Bilateral;  . CYSTOSCOPY WITH BIOPSY N/A  12/11/2019   Procedure: CYSTOSCOPY WITH BLADDER BIOPSY;  Surgeon: Billey Co, MD;  Location: [facility];  Service: Urology;  Laterality: N/A;  . CYSTOSCOPY WITH FULGERATION N/A 12/11/2019   Procedure: CYSTOSCOPY WITH FULGERATION;  Surgeon: Billey Co, MD;  Location: [facility];  Service: Urology;  Laterality: N/A;  . CYSTOSCOPY WITH STENT PLACEMENT Right 12/11/2019   Procedure: CYSTOSCOPY WITH STENT PLACEMENT;  Surgeon: Billey Co, MD;  Location: [facility];  Service: Urology;  Laterality: Right;  . CYSTOSCOPY WITH URETHRAL DILATATION Right 12/11/2019   Procedure: CYSTOSCOPY WITH URETERAL DILATATION;  Surgeon: Billey Co, MD;  Location: [facility];  Service: Urology;  Laterality: Right;  . CYSTOSCOPY/URETEROSCOPY/HOLMIUM LASER Right 07/14/2016   Procedure: CYSTOSCOPY/URETEROSCOPY/HOLMIUM LASER;  Surgeon: Alexis Frock, MD;  Location: [facility];  Service: Urology;  Laterality: Right;  . ESOPHAGOGASTRODUODENOSCOPY N/A 04/04/2015   Procedure: ESOPHAGOGASTRODUODENOSCOPY (EGD);  Surgeon: Hulen Luster, MD;  Location: [facility] ENDOSCOPY;  Service: Endoscopy;  Laterality: N/A;  . ESOPHAGOGASTRODUODENOSCOPY N/A 12/28/2017   Procedure: ESOPHAGOGASTRODUODENOSCOPY (EGD);  Surgeon: Lin Landsman, MD;  Location: [facility] ENDOSCOPY;  Service: Gastroenterology;  Laterality: N/A;  . ESOPHAGOGASTRODUODENOSCOPY (EGD) WITH PROPOFOL N/A 01/18/2016   Procedure: ESOPHAGOGASTRODUODENOSCOPY (EGD) WITH PROPOFOL;  Surgeon: Evangeline Gula  Allen Norris, MD;  Location: [facility];  Service: Endoscopy;  Laterality: N/A;  . FLEXIBLE SIGMOIDOSCOPY N/A 01/18/2016   Procedure: FLEXIBLE SIGMOIDOSCOPY;  Surgeon: Lucilla Lame, MD;  Location: [facility] ENDOSCOPY;  Service: Endoscopy;  Laterality: N/A;  . HERNIA REPAIR    . URETEROSCOPY Right 12/11/2019   Procedure: Christen Butter;  Surgeon: Billey Co, MD;  Location: [facility] OR[facility];  Service: Urology;  Laterality: Right;   Social History:  reports that she has quit smoking. Her smoking use  included cigarettes. She has never used smokeless tobacco. She reports that she does not drink alcohol and does not use drugs.  Allergies  Allergen Reactions  . Aspirin Rash  . Codeine Sulfate Rash  . Erythromycin Rash    Reaction:  Fever   . Prednisone Swelling  . Rosiglitazone Maleate Swelling  . Tetanus-Diphtheria Toxoids Td Rash and Other (See Comments)    Reaction:  Fever    Family History  Problem Relation Age of Onset  . Hypertension Mother   . CAD Sister   . Heart attack Sister        Deceased November 20, 2014  . CAD Brother    Family history: Family history reviewed and diabetes in mother and 2 sisters  Prior to Admission medications   Medication Sig Start Date End Date Taking? Authorizing Provider  albuterol (PROVENTIL HFA;VENTOLIN HFA) 108 (90 Base) MCG/ACT inhaler Inhale 2 puffs into the lungs every 6 (six) hours as needed for wheezing or shortness of breath. 11/27/18   Epifanio Lesches, MD  atorvastatin (LIPITOR) 40 MG tablet Take 40 mg by mouth at bedtime.     [provider]  cholecalciferol (VITAMIN D3) 25 MCG (1000 UNIT) tablet Take 1,000 Units by mouth daily.    [provider]  cyclobenzaprine (FLEXERIL) 10 MG tablet Take 1 tablet (10 mg total) by mouth 3 (three) times daily as needed for muscle spasms. Must last 30 days. 07/07/20 10/05/20  Milinda Pointer, MD  glucose blood (ACCU-CHEK AVIVA PLUS) test strip Use as instructed to test blood sugar up to 4 times daily 10/09/19   [name], NP  insulin detemir (LEVEMIR FLEXTOUCH) 100 UNIT/ML FlexPen INJECT 30 UNITS SUBCUTANEOSLY EVERY MORNING AND AT BEDTIME 07/01/20   Blake Divine, MD  magnesium gluconate (MAGONATE) 500 MG tablet Take 500 mg by mouth 2 (two) times daily.    [provider]  metoprolol tartrate (LOPRESSOR) 25 MG tablet Take 25 mg by mouth 2 (two) times daily. 04/08/20   [provider]  nitroGLYCERIN (NITROSTAT) 0.3 MG SL tablet Place 1 tablet (0.3 mg total)  under the tongue every 5 (five) minutes as needed for chest pain. 07/24/19 07/23/20  Nena Polio, MD  oxyCODONE (ROXICODONE) 5 MG immediate release tablet Take 1 tablet (5 mg total) by mouth every 4 (four) hours as needed for severe pain. 07/07/20 08/06/20  Milinda Pointer, MD  sertraline (ZOLOFT) 50 MG tablet TAKE ONE TABLET EVERY DAY 06/03/20   [name], NP   Physical Exam: Vitals:   07/16/20 1834 07/16/20 1945 07/16/20 2000 07/16/20 11-20-2098  BP: 125/77 (!) 148/75 (!) 156/80 (!) 147/105  Pulse: (!) 110 (!) 117 (!) 116 (!) 111  Resp: (!) 25 18 (!) 26 16  Temp: 98.6 F (37 C)     TempSrc: Oral     SpO2: 100% 100% 99% 97%  Weight:      Height:       Constitutional: appears older than chronological age, mildly uncomfortable Eyes: PERRL, lids and conjunctivae normal  ENMT: Mucous membranes are moist. Posterior pharynx clear of any exudate or lesions. Age-appropriate dentition.  Multiple teeth loss.  Hearing appropriate Neck: normal, supple, no masses, no thyromegaly Respiratory: clear to auscultation bilaterally, no wheezing, no crackles. Normal respiratory effort. No accessory muscle use.  Cardiovascular: Regular rate and rhythm, no murmurs / rubs / gallops. No extremity edema. 2+ pedal pulses. No carotid bruits.  Abdomen: no tenderness, no masses palpated, no hepatosplenomegaly. Bowel sounds positive.  Musculoskeletal: no clubbing / cyanosis. No joint deformity upper and lower extremities. Good ROM, no contractures, no atrophy. Normal muscle tone.  Skin: no  lesions, ulcers. No induration.  Left under breasts rash. Neurologic: CN 2-12 grossly intact. Sensation intact. Strength 5/5 in all 4.  Psychiatric: Normal judgment and insight. Alert and oriented x 3. Normal mood.   EKG: Has been ordered and pending completion  Chest x-ray on Admission: Personally reviewed and I agree with radiologist reading as below.  DG Chest 2 View  Result Date: 07/16/2020 CLINICAL DATA:  Chest  pain, shortness of breath for 5 days, congestion, fever, neighbor recently tested positive for COVID-19 EXAM: CHEST - 2 VIEW COMPARISON:  07/01/2020 FINDINGS: Normal heart size, mediastinal contours, and pulmonary vascularity. Atherosclerotic calcification aorta. Minimal atelectasis at RIGHT base. Remaining lungs clear. No acute infiltrate, pleural effusion, or pneumothorax. Osseous structures unremarkable. IMPRESSION: Minimal RIGHT basilar atelectasis. Aortic Atherosclerosis (ICD10-I70.0). Electronically Signed   By: Lavonia Dana M.D.   On: 07/16/2020 19:42   Labs on Admission: I have personally reviewed following labs  CBC: Recent Labs  Lab 07/16/20 1842  WBC 10.2  HGB 10.7*  HCT 35.2*  MCV 82.4  PLT 800*   Basic Metabolic Panel: Recent Labs  Lab 07/16/20 1842  NA 129*  K 3.6  CL 97*  CO2 21*  GLUCOSE 362*  BUN 13  CREATININE 1.34*  CALCIUM 8.3*   Urine analysis:    Component Value Date/Time   COLORURINE YELLOW (A) 02/20/2020 2214   APPEARANCEUR HAZY (A) 02/20/2020 2214   APPEARANCEUR Cloudy (A) 12/02/2019 1259   LABSPEC 1.006 02/20/2020 2214   LABSPEC 1.033 10/08/2014 0144   PHURINE 6.0 02/20/2020 2214   GLUCOSEU >=500 (A) 02/20/2020 2214   GLUCOSEU >=500 10/08/2014 0144   HGBUR NEGATIVE 02/20/2020 2214   BILIRUBINUR NEGATIVE 02/20/2020 2214   BILIRUBINUR Negative 12/02/2019 1259   BILIRUBINUR Negative 10/08/2014 0144   KETONESUR NEGATIVE 02/20/2020 2214   PROTEINUR NEGATIVE 02/20/2020 2214   UROBILINOGEN 0.2 11/04/2019 1157   UROBILINOGEN 0.2 05/27/2015 1936   NITRITE NEGATIVE 02/20/2020 2214   LEUKOCYTESUR TRACE (A) 02/20/2020 2214   LEUKOCYTESUR Negative 10/08/2014 0144   Florella Mcneese N Kenzly Rogoff D.O. [facility]  If 12AM-7AM, please contact overnight-coverage provider If 7AM-7PM, please contact day coverage provider [email or web address]  07/16/2020, 11:10 PM

## 2020-07-16 NOTE — ED Notes (Signed)
Pt ambulated in room with O2 sat in place. RA saturation range while ambulation, 95-100%. Pt shows increased work of breathing and wet productive cough. MD made aware.

## 2020-07-16 NOTE — ED Triage Notes (Signed)
Pt here via [facility] from home. Pt reports that her neighbor recently tested positive for covid. Pt reports for the past five days she has been short of breath, congested, had chest pain, and fevers. Pt is a type 1 diabetic.

## 2020-07-16 NOTE — ED Provider Notes (Signed)
[facility] Emergency Department Provider Note ____________________________________________   First MD Initiated Contact with Patient 07/16/20 1936     (approximate)  I have reviewed the triage vital signs and the nursing notes.  HISTORY  Chief Complaint Shortness of Breath and Chest Pain   HPI [name] is a 64 y.o. femalewho presents to the ED for evaluation of chest pain and SOB.   Chart review indicates history of HTN, HLD, type 1 diabetes, stroke, A. fib on Eliquis and CAD.  Patient reports being fully vaccinated for COVID-19.  Patient reports about 5 days of progressively worsening fevers, productive cough, generalized weakness and taste changes.  She reports associated presyncopal lightheaded dizziness without syncope.  Denies falls or trauma.  She denies abdominal pain, emesis.  She reports compliance with her insulin.   Past Medical History:  Diagnosis Date  . Acute encephalopathy 05/22/2018  . Allergy   . Anginal pain ([place])   . Anxiety   . Ascites   . C. difficile colitis 07/10/2015  . Cancer (HCC)    HX OF CANCER OF UTERUS   . Cirrhosis of liver not due to alcohol ([place]) 2016  . Degenerative disk disease   . Diverticulitis   . Gastroparesis   . GERD (gastroesophageal reflux disease)   . History of hiatal hernia   . Hypertension   . Hypothyroid   . Hypothyroidism due to amiodarone   . Ileus ([place]) 08/01/2015  . Intussusception intestine ([place]) 05/2015  . Orthostatic hypotension   . PAF (paroxysmal atrial fibrillation) ([place]) 03/2015   a. new onset 03/2015 in setting of intractable N/V; b. on Eliquis 5 mg bid; c. CHADSVASc 4 (DM, TIA x 2, female)  . Pancreatitis   . Pneumonia 11/14/2015  . Right ureteral stone 07/14/2016  . Sepsis ([place]) 12/15/2017  . Sick sinus syndrome ([place])   . Stomach ulcer   . Stroke [facility])    with minimal left sided weakness  . Syncope 01/2015  . Syncope due to orthostatic hypotension 05/18/2015  . Tachyarrhythmia  01/10/2016  . TIA (transient ischemic attack) 02/2015  . Type 1 diabetes ([place])    on levemir  . UTERINE CANCER, HX OF 03/27/2007   Qualifier: Diagnosis of  By: Maxie Better FNP, Rosalita Levan   . UTI (urinary tract infection) 05/22/2018    Patient Active Problem List   Diagnosis Date Noted  . Shortness of breath 07/16/2020  . [place] intertrigo 07/16/2020  . Sepsis ([place]) 11/23/2019  . Acute pyelonephritis 09/30/2019  . Acute pain of left foot 08/18/2019  . Type II diabetes mellitus with renal manifestations ([place]) 08/13/2019  . Weakness of both lower extremities 07/31/2019  . Altered mental status 07/14/2019  . GAD (generalized anxiety disorder) 06/11/2019  . Poor memory 06/11/2019  . Constipation 05/25/2019  . Acute medial meniscus tear of left knee 05/25/2019  . AKI (acute kidney injury) ([place]) 05/16/2019  . Closed fracture of left distal fibula 05/16/2019  . Sinus tachycardia 05/05/2019  . Congestive dilated cardiomyopathy ([place]) 04/14/2019  . CAD (coronary artery disease) 04/10/2019  . Closed fracture of lateral malleolus 04/10/2019  . Lumbar facet syndrome (Bilateral) (R>L) 03/09/2019  . Long term (current) use of anticoagulants 03/09/2019  . Unspecified cirrhosis of liver ([place]) 02/11/2019  . Hypoalbuminemia 02/11/2019  . Edema due to hypoalbuminemia 02/11/2019  . Lower extremity edema 01/26/2019  . Prolonged QT interval 12/17/2018  . Hypomagnesemia 12/17/2018  . Uncontrolled type 2 diabetes mellitus ([place]) 12/17/2018  . H/O TIA (transient  ischemic attack) and stroke 12/17/2018  . Personal history of surgery to heart and great vessels, presenting hazards to health 12/17/2018  . DDD (degenerative disc disease), lumbar 12/17/2018  . Lumbar intervertebral disc displacement 12/17/2018  . Chronic musculoskeletal pain 12/17/2018  . Chronic generalized pain 12/17/2018  . Hyponatremia 12/17/2018  . Fibromyalgia syndrome 12/17/2018  . Other intervertebral disc displacement, lumbar  region 12/17/2018  . Vitamin D deficiency 11/26/2018  . Elevated C-reactive protein (CRP) 11/26/2018  . Elevated sed rate 11/26/2018  . Chronic low back pain (Primary area of Pain) (Bilateral) (L>R) w/ sciatica (Bilateral) 11/25/2018  . Chronic lower extremity pain (Secondary Area of Pain) (Bilateral) (L>R) 11/25/2018  . Chronic neck pain 11/25/2018  . Pharmacologic therapy 11/25/2018  . Disorder of skeletal system 11/25/2018  . Problems influencing health status 11/25/2018  . Renal mass 10/06/2018  . Chronic intermittent abdominal pain 09/25/2018  . Unspecified abdominal pain 09/25/2018  . Frequent falls 09/12/2018  . Orthostatic hypotension 08/18/2018  . Normochromic normocytic anemia 08/18/2018  . Acute renal failure superimposed on stage 3a chronic kidney disease ([place]) 08/18/2018  . History of hypothyroidism 08/14/2018  . Medical non-compliance 08/14/2018  . Diabetic ketoacidosis ([place]) 05/22/2018  . UTI (urinary tract infection) 05/22/2018  . NSTEMI (non-ST elevated myocardial infarction) ([place]) 01/11/2016  . Tachy-brady syndrome ([place]) 12/28/2015  . PAF (paroxysmal atrial fibrillation) ([place]) 11/24/2015  . Type 2 diabetes mellitus with neurologic complication ([place]) 81/44/8185  . S/P cholecystectomy 11/23/2015  . Narcotic abuse ([place]) 11/22/2015  . Chronic superficial gastritis without bleeding 11/22/2015  . History of Clostridium difficile 11/22/2015  . History of TIA (transient ischemic attack) 11/22/2015  . Mixed hyperlipidemia 11/22/2015  . Diverticulosis 11/22/2015  . History of uterine cancer 11/22/2015  . Hypothyroidism, unspecified 11/22/2015  . Intractable cyclical vomiting with nausea 11/22/2015  . Narcotic withdrawal ([place]) 11/11/2015  . Hyperlipidemia 08/31/2015  . Hypotension 08/06/2015  . Cryptogenic cirrhosis ([place]) 07/10/2015  . Atrial fibrillation ([place]) 07/10/2015  . Malnutrition of moderate degree 07/04/2015  . Symptomatic bradycardia 05/27/2015  . Chronic  anemia 05/19/2015  . Thrombocytopenia ([place]) 05/19/2015  . Hypokalemia 04/06/2015  . Hyperlipidemia with target LDL less than 100 04/06/2015  . Syncope 03/11/2015  . CVA (cerebral vascular accident) ([place]) 02/15/2015  . Essential hypertension 01/12/2015  . Chronically on opiate therapy 01/12/2015  . Cirrhosis of liver not due to alcohol ([place]) 2016  . S/P Nissen fundoplication (without gastrostomy tube) procedure 05/11/2014  . Dysphagia 04/19/2014  . DEPRESSION/ANXIETY 06/27/2007  . Myofascial pain syndrome 06/27/2007  . Chronic pain syndrome 03/28/2007  . GERD 03/27/2007  . Diverticulosis of large intestine without perforation or abscess without bleeding 03/27/2007  . Proteinuria 03/27/2007    Past Surgical History:  Procedure Laterality Date  . ABDOMINAL HYSTERECTOMY    . CARDIAC CATHETERIZATION N/A 01/12/2016   Procedure: Left Heart Cath and Coronary Angiography;  Surgeon: Wellington Hampshire, MD;  Location: [place] CV [facility];  Service: Cardiovascular;  Laterality: N/A;  . CHOLECYSTECTOMY    . CYSTOSCOPY W/ RETROGRADES Bilateral 12/11/2019   Procedure: CYSTOSCOPY WITH RETROGRADE PYELOGRAM;  Surgeon: Billey Co, MD;  Location: [facility];  Service: Urology;  Laterality: Bilateral;  . CYSTOSCOPY WITH BIOPSY N/A 12/11/2019   Procedure: CYSTOSCOPY WITH BLADDER BIOPSY;  Surgeon: Billey Co, MD;  Location: [facility];  Service: Urology;  Laterality: N/A;  . CYSTOSCOPY WITH FULGERATION N/A 12/11/2019   Procedure: CYSTOSCOPY WITH FULGERATION;  Surgeon: Billey Co, MD;  Location: [facility];  Service: Urology;  Laterality: N/A;  . CYSTOSCOPY WITH STENT PLACEMENT Right 12/11/2019   Procedure: CYSTOSCOPY WITH STENT PLACEMENT;  Surgeon: Billey Co, MD;  Location: [facility];  Service: Urology;  Laterality: Right;  . CYSTOSCOPY WITH URETHRAL DILATATION Right 12/11/2019   Procedure: CYSTOSCOPY WITH URETERAL DILATATION;  Surgeon: Billey Co, MD;  Location: [facility];  Service: Urology;   Laterality: Right;  . CYSTOSCOPY/URETEROSCOPY/HOLMIUM LASER Right 07/14/2016   Procedure: CYSTOSCOPY/URETEROSCOPY/HOLMIUM LASER;  Surgeon: Alexis Frock, MD;  Location: [facility];  Service: Urology;  Laterality: Right;  . ESOPHAGOGASTRODUODENOSCOPY N/A 04/04/2015   Procedure: ESOPHAGOGASTRODUODENOSCOPY (EGD);  Surgeon: Hulen Luster, MD;  Location: [facility] ENDOSCOPY;  Service: Endoscopy;  Laterality: N/A;  . ESOPHAGOGASTRODUODENOSCOPY N/A 12/28/2017   Procedure: ESOPHAGOGASTRODUODENOSCOPY (EGD);  Surgeon: Lin Landsman, MD;  Location: [facility] ENDOSCOPY;  Service: Gastroenterology;  Laterality: N/A;  . ESOPHAGOGASTRODUODENOSCOPY (EGD) WITH PROPOFOL N/A 01/18/2016   Procedure: ESOPHAGOGASTRODUODENOSCOPY (EGD) WITH PROPOFOL;  Surgeon: Lucilla Lame, MD;  Location: [facility] ENDOSCOPY;  Service: Endoscopy;  Laterality: N/A;  . FLEXIBLE SIGMOIDOSCOPY N/A 01/18/2016   Procedure: FLEXIBLE SIGMOIDOSCOPY;  Surgeon: Lucilla Lame, MD;  Location: [facility] ENDOSCOPY;  Service: Endoscopy;  Laterality: N/A;  . HERNIA REPAIR    . URETEROSCOPY Right 12/11/2019   Procedure: Christen Butter;  Surgeon: Billey Co, MD;  Location: [facility];  Service: Urology;  Laterality: Right;    Prior to Admission medications   Medication Sig Start Date End Date Taking? Authorizing Provider  albuterol (PROVENTIL HFA;VENTOLIN HFA) 108 (90 Base) MCG/ACT inhaler Inhale 2 puffs into the lungs every 6 (six) hours as needed for wheezing or shortness of breath. 11/27/18  Yes Epifanio Lesches, MD  atorvastatin (LIPITOR) 40 MG tablet Take 40 mg by mouth at bedtime.    Yes [provider]  cholecalciferol (VITAMIN D3) 25 MCG (1000 UNIT) tablet Take 1,000 Units by mouth daily.   Yes [provider]  cyclobenzaprine (FLEXERIL) 10 MG tablet Take 1 tablet (10 mg total) by mouth 3 (three) times daily as needed for muscle spasms. Must last 30 days. 07/07/20 10/05/20 Yes Milinda Pointer, MD  insulin detemir (LEVEMIR FLEXTOUCH) 100  UNIT/ML FlexPen INJECT 30 UNITS SUBCUTANEOSLY EVERY MORNING AND AT BEDTIME 07/01/20  Yes Blake Divine, MD  magnesium gluconate (MAGONATE) 500 MG tablet Take 500 mg by mouth 2 (two) times daily.   Yes [provider]  metoprolol tartrate (LOPRESSOR) 25 MG tablet Take 25 mg by mouth 2 (two) times daily. 04/08/20  Yes [provider]  nitroGLYCERIN (NITROSTAT) 0.3 MG SL tablet Place 1 tablet (0.3 mg total) under the tongue every 5 (five) minutes as needed for chest pain. 07/24/19 07/23/20 Yes Nena Polio, MD  oxyCODONE (ROXICODONE) 5 MG immediate release tablet Take 1 tablet (5 mg total) by mouth every 4 (four) hours as needed for severe pain. 07/07/20 08/06/20 Yes Milinda Pointer, MD  sertraline (ZOLOFT) 50 MG tablet TAKE ONE TABLET EVERY DAY 06/03/20  Yes Rita Koch, NP  glucose blood (ACCU-CHEK AVIVA PLUS) test strip Use as instructed to test blood sugar up to 4 times daily 10/09/19   Rita Koch, NP    Allergies Aspirin, Codeine sulfate, Erythromycin, Prednisone, Rosiglitazone maleate, and Tetanus-diphtheria toxoids td  Family History  Problem Relation Age of Onset  . Hypertension Mother   . CAD Sister   . Heart attack Sister        Deceased 23-Nov-2014  . CAD Brother     Social History Social History   Tobacco Use  . Smoking status:  Former Smoker    Types: Cigarettes  . Smokeless tobacco: Never Used  . Tobacco comment: 25 years ago and only smoked occasionally  Vaping Use  . Vaping Use: Never used  Substance Use Topics  . Alcohol use: No  . Drug use: No    Review of Systems  Constitutional: No fever/chills Eyes: No visual changes. ENT: No sore throat. Cardiovascular: Denies chest pain. Respiratory: Denies shortness of breath. Gastrointestinal: No abdominal pain.  No nausea, no vomiting.  No diarrhea.  No constipation. Genitourinary: Negative for dysuria. Musculoskeletal: Negative for back pain. Skin: Negative for rash. Neurological:  Negative for headaches, focal weakness or numbness.   ____________________________________________   PHYSICAL EXAM:  VITAL SIGNS: Vitals:   07/16/20 2000 07/16/20 2100  BP: (!) 156/80 (!) 147/105  Pulse: (!) 116 (!) 111  Resp: (!) 26 16  Temp:    SpO2: 99% 97%      Constitutional: Alert and oriented.  Uncomfortable-appearing, sitting up in bed frequently getting into coughing fits. Eyes: Conjunctivae are normal. PERRL. EOMI. Head: Atraumatic. Nose: No congestion/rhinnorhea. Mouth/Throat: Mucous membranes are dry.  Oropharynx non-erythematous. Neck: No stridor. No cervical spine tenderness to palpation. Cardiovascular: Tachycardic rate, regular rhythm. Grossly normal heart sounds.  Good peripheral circulation. Respiratory: No retractions. Lungs CTAB. Tachypneic to the mid 20s Gastrointestinal: Soft , nondistended, nontender to palpation. No CVA tenderness. Musculoskeletal: No lower extremity tenderness nor edema.  No joint effusions. No signs of acute trauma. Neurologic:  Normal speech and language. No gross focal neurologic deficits are appreciated. No gait instability noted. Skin:  Skin is warm, dry and intact. No rash noted. Psychiatric: Mood and affect are normal. Speech and behavior are normal.  ____________________________________________   LABS (all labs ordered are listed, but only abnormal results are displayed)  Labs Reviewed  BASIC METABOLIC PANEL - Abnormal; Notable for the following components:      Result Value   Sodium 129 (*)    Chloride 97 (*)    CO2 21 (*)    Glucose, Bld 362 (*)    Creatinine, Ser 1.34 (*)    Calcium 8.3 (*)    GFR, Estimated 44 (*)    All other components within normal limits  CBC - Abnormal; Notable for the following components:   Hemoglobin 10.7 (*)    HCT 35.2 (*)    MCH 25.1 (*)    RDW 17.2 (*)    Platelets 139 (*)    All other components within normal limits  RESPIRATORY PANEL BY RT PCR (FLU A&B, COVID)  TSH  HIV  ANTIBODY (ROUTINE TESTING W REFLEX)  BRAIN NATRIURETIC PEPTIDE  BASIC METABOLIC PANEL  CBC  TROPONIN I (HIGH SENSITIVITY)  TROPONIN I (HIGH SENSITIVITY)   ____________________________________________  12 Lead EKG  Sinus rhythm, rate of 115 bpm.  Normal axis and intervals.  No evidence of acute ischemia. ____________________________________________  RADIOLOGY  ED MD interpretation: 2 view CXR reviewed by me without evidence of acute cardiopulmonary pathology.  Official radiology report(s): DG Chest 2 View  Result Date: 07/16/2020 CLINICAL DATA:  Chest pain, shortness of breath for 5 days, congestion, fever, neighbor recently tested positive for COVID-19 EXAM: CHEST - 2 VIEW COMPARISON:  07/01/2020 FINDINGS: Normal heart size, mediastinal contours, and pulmonary vascularity. Atherosclerotic calcification aorta. Minimal atelectasis at RIGHT base. Remaining lungs clear. No acute infiltrate, pleural effusion, or pneumothorax. Osseous structures unremarkable. IMPRESSION: Minimal RIGHT basilar atelectasis. Aortic Atherosclerosis (ICD10-I70.0). Electronically Signed   By: [name] M.D.   On: 07/16/2020 19:42  ____________________________________________   PROCEDURES and INTERVENTIONS  Procedure(s) performed (including Critical Care):  .1-3 Lead EKG Interpretation Performed by: Rita Crofts, MD Authorized by: Rita Crofts, MD     Interpretation: abnormal     ECG rate:  110   ECG rate assessment: tachycardic     Rhythm: sinus tachycardia     Ectopy: none     Conduction: normal      Medications  chlorpheniramine-HYDROcodone (TUSSIONEX) 10-8 MG/5ML suspension 5 mL (has no administration in time range)  benzonatate (TESSALON) capsule 200 mg (has no administration in time range)  oxyCODONE (Oxy IR/ROXICODONE) immediate release tablet 5 mg (has no administration in time range)  atorvastatin (LIPITOR) tablet 40 mg (has no administration in time range)  metoprolol tartrate  (LOPRESSOR) tablet 25 mg (has no administration in time range)  nitroGLYCERIN (NITROSTAT) SL tablet 0.3 mg (has no administration in time range)  sertraline (ZOLOFT) tablet 50 mg (has no administration in time range)  insulin detemir (LEVEMIR) FlexPen 30 Units (has no administration in time range)  cyclobenzaprine (FLEXERIL) tablet 10 mg (has no administration in time range)  enoxaparin (LOVENOX) injection 40 mg (has no administration in time range)  acetaminophen (TYLENOL) tablet 650 mg (has no administration in time range)    Or  acetaminophen (TYLENOL) suppository 650 mg (has no administration in time range)  0.9 %  sodium chloride infusion (has no administration in time range)  nystatin (MYCOSTATIN/NYSTOP) topical powder (has no administration in time range)  lactated ringers bolus 1,000 mL (1,000 mLs Intravenous New Bag/Given 07/16/20 2059)  morphine 2 MG/ML injection 2 mg (2 mg Intravenous Given 07/16/20 2100)    ____________________________________________   MDM / ED COURSE   Fully vaccinated 64 year old woman presents to the ED with symptoms of COVID-19, testing negative, but requiring medical admission due to the severity of her symptoms.  Patient persistently tachycardic, tachypneic and frequently coughing and clearly uncomfortable.  While she has no hypoxia she is clearly struggling to breathe.  Blood work demonstrates hyperglycemia without significant acidosis or anion gap to suggest DKA.  CXR is clear.  EKG is nonischemic and troponin is low.  Despite her positive sick contact and constellation of symptoms, her Covid test returns negative.  Overall syndrome is concerning for COVID-19.  CTA chest pending at the time of signout to evaluate for acute PE.  Will admit the patient to hospitalist medicine for further work-up and management of her respiratory symptoms.   Clinical Course as of Jul 18 7  Sat Jul 16, 2020  2233 Spoke with admitting hospitalist, Dr. Tobie Poet.  We discussed  possibility of acute PE, and I agree to order CTA chest   [DS]    Clinical Course User Index [DS] Rita Crofts, MD    ____________________________________________   FINAL CLINICAL IMPRESSION(S) / ED DIAGNOSES  Final diagnoses:  Clinical diagnosis of COVID-19  Shortness of breath  Cough     ED Discharge Orders    None       Exodus Kutzer Tamala Julian   Note:  This document was prepared using Dragon voice recognition software and may include unintentional dictation errors.   Rita Crofts, MD 07/17/20 0010

## 2020-07-16 NOTE — ED Notes (Signed)
First Nurse Note: Pt to ED via EMS from home for COVID sx's. Pt has fever, cough, and positive COVID exposure.  Temp 101.2 BP: 142/70 HR: 110 RR: 18-20 SpO2 97% on room air CBG 118

## 2020-07-17 ENCOUNTER — Observation Stay: Payer: Medicare HMO

## 2020-07-17 ENCOUNTER — Encounter: Payer: Self-pay | Admitting: Internal Medicine

## 2020-07-17 DIAGNOSIS — J189 Pneumonia, unspecified organism: Secondary | ICD-10-CM | POA: Diagnosis not present

## 2020-07-17 DIAGNOSIS — R0602 Shortness of breath: Secondary | ICD-10-CM | POA: Diagnosis not present

## 2020-07-17 DIAGNOSIS — I2699 Other pulmonary embolism without acute cor pulmonale: Secondary | ICD-10-CM | POA: Diagnosis not present

## 2020-07-17 DIAGNOSIS — R911 Solitary pulmonary nodule: Secondary | ICD-10-CM | POA: Diagnosis not present

## 2020-07-17 LAB — RESPIRATORY PANEL BY PCR

## 2020-07-17 LAB — CBC
HCT: 28.4 % — ABNORMAL LOW (ref 36.0–46.0)
Hemoglobin: 9 g/dL — ABNORMAL LOW (ref 12.0–15.0)
MCH: 25.6 pg — ABNORMAL LOW (ref 26.0–34.0)
MCHC: 31.7 g/dL (ref 30.0–36.0)
MCV: 80.9 fL (ref 80.0–100.0)
Platelets: 137 10*3/uL — ABNORMAL LOW (ref 150–400)
RBC: 3.51 MIL/uL — ABNORMAL LOW (ref 3.87–5.11)
RDW: 17.6 % — ABNORMAL HIGH (ref 11.5–15.5)
WBC: 9.8 10*3/uL (ref 4.0–10.5)
nRBC: 0 % (ref 0.0–0.2)

## 2020-07-17 LAB — HIV ANTIBODY (ROUTINE TESTING W REFLEX): HIV Screen 4th Generation wRfx: NONREACTIVE

## 2020-07-17 LAB — CBG MONITORING, ED
Glucose-Capillary: 278 mg/dL — ABNORMAL HIGH (ref 70–99)
Glucose-Capillary: 201 mg/dL — ABNORMAL HIGH (ref 70–99)
Glucose-Capillary: 173 mg/dL — ABNORMAL HIGH (ref 70–99)

## 2020-07-17 LAB — BASIC METABOLIC PANEL
Anion gap: 7 (ref 5–15)
BUN: 15 mg/dL (ref 8–23)
CO2: 21 mmol/L — ABNORMAL LOW (ref 22–32)
Calcium: 8 mg/dL — ABNORMAL LOW (ref 8.9–10.3)
Chloride: 102 mmol/L (ref 98–111)
Creatinine, Ser: 1.4 mg/dL — ABNORMAL HIGH (ref 0.44–1.00)
GFR, Estimated: 41 mL/min — ABNORMAL LOW (ref >60)
Glucose, Bld: 287 mg/dL — ABNORMAL HIGH (ref 70–99)
Potassium: 3.9 mmol/L (ref 3.5–5.1)
Sodium: 130 mmol/L — ABNORMAL LOW (ref 135–145)

## 2020-07-17 LAB — BRAIN NATRIURETIC PEPTIDE: B Natriuretic Peptide: 132 pg/mL — ABNORMAL HIGH (ref 0.0–100.0)

## 2020-07-17 LAB — TSH: TSH: 4.487 u[IU]/mL (ref 0.350–4.500)

## 2020-07-17 LAB — PROCALCITONIN: Procalcitonin: 0.18 ng/mL

## 2020-07-17 MED ORDER — IOHEXOL 350 MG/ML SOLN
60.0000 mL | Freq: Once | INTRAVENOUS | Status: AC | PRN
  Administered 2020-07-17: 60 mL via INTRAVENOUS

## 2020-07-17 MED ORDER — PANTOPRAZOLE SODIUM 40 MG PO TBEC
40.0000 mg | DELAYED_RELEASE_TABLET | Freq: Every day | ORAL | Status: DC
  Administered 2020-07-18: 40 mg via ORAL
  Filled 2020-07-17: qty 1

## 2020-07-17 MED ORDER — BENZONATATE 100 MG PO CAPS
200.0000 mg | ORAL_CAPSULE | Freq: Three times a day (TID) | ORAL | Status: DC | PRN
  Administered 2020-07-18: 200 mg via ORAL
  Filled 2020-07-17: qty 2

## 2020-07-17 MED ORDER — HYDROCOD POLST-CPM POLST ER 10-8 MG/5ML PO SUER
5.0000 mL | Freq: Two times a day (BID) | ORAL | Status: DC | PRN
  Administered 2020-07-17: 5 mL via ORAL
  Filled 2020-07-17: qty 5

## 2020-07-17 NOTE — ED Notes (Signed)
Husband called, updated that pt sleeping, "tell her that I called"

## 2020-07-17 NOTE — ED Notes (Signed)
Overdue medications not yet verified by pharmacy. Pharmacy contacted about verification and re timing medications.

## 2020-07-17 NOTE — Progress Notes (Signed)
PROGRESS NOTE    [name]  NIO:[id number] DOB: 03-Jul-1956 DOA: 07/16/2020 PCP: [name], NP   Chief Complaint  Patient presents with  . Shortness of Breath  . Chest Pain    Brief Narrative:  [name] is [name] 64 y.o. female with medical history significant for DM1 on levamir 30 u in AM and PM, hyperlipidemia on atorvastatin 40 mg qhs, back pain on flexeril, depression on sertraline 50 mg daily, on metoprolol tartrate for afib rate control.   She presented to ED from home for chief concern of worsening shortness of breath that started about 5 days ago. She was exposed to [name] friend 7 days ago and that friend later tested positive for covid.   She denied fever at home. She endorses lost of taste and smell that started 3 days prior, and dysphagia with food and liquid. She states the cough is productive and thick and tan colored.   She endorsed pressured chest pain at rest at the left upper chest wall. She also endorsed chest pain with coughing, however feels these two are different types of chest pain. She endorses weakness in her legs about 4-5 days, especially with standing up from [name] sitting or laying down position. She endorses feeling Rita Ozga little unbalanced when she does start to ambulate. This is new for her. She endorses muscle aches.   She endorses painful headache behind her eyes that causes her to tear up, and nausea with the headache. She denies vomiting.  She denies abdominal pain, dysuria, ringing in ears, fever, chills, odynophagia, diarrhea, constipation, hematuria.   Her last BM was yesterday, 07/15/20, brown and [name] good amount.   Social history: Lives at home with has been, no exposure to pets.  Denies tobacco use, EtOH use, recreational drug use.  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  ED Course: discussed with ED provider.  Covid-like symptoms with negative Covid test and fully vaccinated for Covid.  Concerning for  decompensation requiring O2 supplementation.  Assessment & Plan:   Active Problems:   Atrial fibrillation (HCC)   History of TIA (transient ischemic attack)   Hyperlipidemia   CAD (coronary artery disease)   Shortness of breath   Candidal intertrigo  Shortness of breath-Covid-like symptoms, etiology likely viral infection -No wheezing on physical exam, no indications for steroids at this time and patient is not requiring oxygen supplementation - she's vaccinated, but had exposure and concerning symptoms for COVID 19 - will keep on isolation and retest with RVP (with repeat COVID testing as well) -Tested negative for Covid, influenza [name]/B -- CT PE protocol without evidence of PE, possible esophagitis, possible mucous plugging or aspiration - stigmata of cirrhosis, mild symmeric bilateral perinephric stranding (nonspecific) -- will follow echo, elevated BNP  -- follow procal and CRP  History of paroxysmal atrial fibrillation-on metoprolol tartrate, patient reports she is not on anticoagulation -Per [facility] note, she was previously on apixaban 5 mg twice daily in 2017 -Patient states she is currently not taking anticoagulation, and she does not know why -She states that it has been over [name] year since she saw her cardiologist due to Covid pandemic - will need to follow up  Left under breast intertrigo-present on admission, nystatin powder, apply to clean dry skin twice daily  CKD IIIb: renal function appears close to baseline  Diabetes type 1-she says she takes Levemir 30 units twice daily, no short-acting coverage -Insulin detemir 30 units twice daily ordered  Hyponatremia -  likely related to hyperglycemia, continue to monitor  History of levothyroxine-previously on Synthroid 75 mcg daily  Hyperlipidemia-atorvastatin 40 nightly  Depression-sertraline 50 mg daily  Will confirm med rec has been completed with pharmacy  DVT prophylaxis: lovenox Code Status: full  Family  Communication: none at bedside Disposition:   Status is: Observation  The patient will require care spanning > 2 midnights and should be moved to inpatient because: Inpatient level of care appropriate due to severity of illness, pending studies (echo)  Dispo: The patient is from: Home              Anticipated d/c is to: Home              Anticipated d/c date is: 1 day              Patient currently is not medically stable to d/c.   Consultants:   none  Procedures:   none  Antimicrobials:  Anti-infectives (From admission, onward)   None         Subjective: C/o cough and SOB Doesn't feel well enough to go home today  Objective: Vitals:   07/17/20 0751 07/17/20 1057 07/17/20 1249 07/17/20 1431  BP: (!) 153/83 (!) 147/78  (!) 142/50  Pulse:  (!) 109 83 87  Resp:  20 19 17   Temp:  98.7 F (37.1 C)  98.3 F (36.8 C)  TempSrc:  Oral  Oral  SpO2:  100% 100% 100%  Weight:      Height:        Intake/Output Summary (Last 24 hours) at 07/17/2020 1609 Last data filed at 07/17/2020 2831 Gross per 24 hour  Intake 1000 ml  Output --  Net 1000 ml   Filed Weights   07/16/20 1829  Weight: 61.2 kg    Examination:  General exam: Appears calm and comfortable  Respiratory system: unlabored, frequent, coarse cough  Cardiovascular system: S1 & S2 heard, RRR Gastrointestinal system: Abdomen is nondistended, soft and nontender Central nervous system: Alert and oriented. No focal neurological deficits. Extremities: no [name] Skin: No rashes, lesions or ulcers Psychiatry: Judgement and insight appear normal. Mood & affect appropriate.     Data Reviewed: I have personally reviewed following labs and imaging studies  CBC: Recent Labs  Lab 07/16/20 1842 07/17/20 0610  WBC 10.2 9.8  HGB 10.7* 9.0*  HCT 35.2* 28.4*  MCV 82.4 80.9  PLT 139* 137*    Basic Metabolic Panel: Recent Labs  Lab 07/16/20 1842 07/17/20 0610  NA 129* 130*  K 3.6 3.9  CL 97* 102  CO2 21*  21*  GLUCOSE 362* 287*  BUN 13 15  CREATININE 1.34* 1.40*  CALCIUM 8.3* 8.0*    GFR: Estimated Creatinine Clearance: 32.7 mL/min ([name]) (by C-G formula based on SCr of 1.4 mg/dL (H)).  Liver Function Tests: No results for input(s): AST, ALT, ALKPHOS, BILITOT, PROT, ALBUMIN in the last 168 hours.  CBG: Recent Labs  Lab 07/17/20 0930  GLUCAP 278*     Recent Results (from the past 240 hour(s))  Respiratory Panel by RT PCR (Flu Earon Rivest&B, Covid) - Nasopharyngeal Swab     Status: None   Collection Time: 07/16/20  7:38 PM   Specimen: Nasopharyngeal Swab  Result Value Ref Range Status   SARS Coronavirus 2 by RT PCR NEGATIVE NEGATIVE Final    Comment: (NOTE) SARS-CoV-2 target nucleic acids are NOT DETECTED.  The SARS-CoV-2 RNA is generally detectable in upper respiratoy specimens during the acute phase of infection.  The lowest concentration of SARS-CoV-2 viral copies this assay can detect is 131 copies/mL. Akshaya Toepfer negative result does not preclude SARS-Cov-2 infection and should not be used as the sole basis for treatment or other patient management decisions. Slayter Moorhouse negative result may occur with  improper specimen collection/handling, submission of specimen other than nasopharyngeal swab, presence of viral mutation(s) within the areas targeted by this assay, and inadequate number of viral copies (<131 copies/mL). Jamiyla Ishee negative result must be combined with clinical observations, patient history, and epidemiological information. The expected result is Negative.  Fact Sheet for Patients:  PinkCheek.be  Fact Sheet for Healthcare Providers:  GravelBags.it  This test is no t yet approved or cleared by the [place] FDA and  has been authorized for detection and/or diagnosis of SARS-CoV-2 by FDA under an Emergency Use Authorization (EUA). This EUA will remain  in effect (meaning this test can be used) for the duration of the COVID-19  declaration under Section 564(b)(1) of the Act, 21 U.S.C. section 360bbb-3(b)(1), unless the authorization is terminated or revoked sooner.     Influenza [place] Vi by PCR NEGATIVE NEGATIVE Final   Influenza B by PCR NEGATIVE NEGATIVE Final    Comment: (NOTE) The Xpert Xpress SARS-CoV-2/FLU/RSV assay is intended as an aid in  the diagnosis of influenza from Nasopharyngeal swab specimens and  should not be used as Gedeon Brandow sole basis for treatment. Nasal washings and  aspirates are unacceptable for Xpert Xpress SARS-CoV-2/FLU/RSV  testing.  Fact Sheet for Patients: PinkCheek.be  Fact Sheet for Healthcare Providers: GravelBags.it  This test is not yet approved or cleared by the [place] FDA and  has been authorized for detection and/or diagnosis of SARS-CoV-2 by  FDA under an Emergency Use Authorization (EUA). This EUA will remain  in effect (meaning this test can be used) for the duration of the  Covid-19 declaration under Section 564(b)(1) of the Act, 21  U.S.C. section 360bbb-3(b)(1), unless the authorization is  terminated or revoked. Performed at [facility], [address] [place], [place] [address]          Radiology Studies: DG Chest 2 View  Result Date: 07/16/2020 CLINICAL DATA:  Chest pain, shortness of breath for 5 days, congestion, fever, neighbor recently tested positive for COVID-19 EXAM: CHEST - 2 VIEW COMPARISON:  07/01/2020 FINDINGS: Normal heart size, mediastinal contours, and pulmonary vascularity. Atherosclerotic calcification aorta. Minimal atelectasis at RIGHT base. Remaining lungs clear. No acute infiltrate, pleural effusion, or pneumothorax. Osseous structures unremarkable. IMPRESSION: Minimal RIGHT basilar atelectasis. Aortic Atherosclerosis (ICD10-I70.0). Electronically Signed   By: Lavonia Dana M.D.   On: 07/16/2020 19:42   CT Angio Chest PE W and/or Wo Contrast  Result Date:  07/17/2020 CLINICAL DATA:  Shortness of breath, tachycardia, concern for pulmonary embolism EXAM: CT ANGIOGRAPHY CHEST WITH CONTRAST TECHNIQUE: Multidetector CT imaging of the chest was performed using the standard protocol during bolus administration of intravenous contrast. Multiplanar CT image reconstructions and MIPs were obtained to evaluate the vascular anatomy. CONTRAST:  56m OMNIPAQUE IOHEXOL 350 MG/ML SOLN COMPARISON:  CT 02/21/2020 FINDINGS: Cardiovascular: Satisfactory opacification the pulmonary arteries to the segmental level. No pulmonary artery filling defects are identified. Central pulmonary arteries are normal caliber. The aortic root is suboptimally assessed given cardiac pulsation artifact. Atherosclerotic plaque within the normal caliber aorta. No acute luminal abnormality of the imaged aorta. No periaortic stranding or hemorrhage. Normal 3 vessel branching of the aortic arch. Proximal great vessels are tortuous and mildly calcified but otherwise unremarkable. No major venous  abnormality is seen. Cardiac size is within normal limits. Trace pericardial fluid. Mediastinum/Nodes: No mediastinal fluid or gas. Normal thyroid gland and thoracic inlet. No acute abnormality of the trachea. There is some mild circumferential thickening of the lower thoracic esophagus with some faint paraesophageal stranding. No extraluminal gas or collection is seen. No worrisome mediastinal, hilar or axillary adenopathy. Lungs/Pleura: Lung volumes are low likely accentuated by imaging during exhalation. There are few fluid-filled airways seen in the medial basilar segment of the left lower lobe which could reflect mucous plugging or aspiration. No consolidative airspace opacity is seen. No convincing features of edema. No pneumothorax or effusion. No worrisome pulmonary nodules or masses. Upper Abdomen: Splenomegaly is similar to comparison. Prior cholecystectomy. Mildly heterogenous and nodular liver compatible with  stigmata of cirrhosis. Upper abdominal venous collateralization is noted. Mild symmetric bilateral perinephric stranding, Vee Bahe nonspecific finding which may correlate with advanced age or decreased renal function. Musculoskeletal: No acute or worrisome chest wall or osseous abnormality. Mild degenerative changes in the spine. Scoliotic curvature of the spine. No worrisome chest wall masses or lesions. Review of the MIP images confirms the above findings. IMPRESSION: 1. No evidence of acute pulmonary artery filling defects to suggest pulmonary embolism. 2. Mild circumferential thickening of the lower thoracic esophagus with some faint paraesophageal stranding, suggestive of esophagitis. No extraluminal gas or collection is seen. 3. Few fluid-filled airways in the medial basilar segment of the left lower lobe could reflect mucous plugging or aspiration. No consolidative airspace opacity is seen to suggest pneumonia. 4. Stigmata of cirrhosis and sequela of portal hypertension including splenomegaly and upper abdominal venous collateralization. 5. Mild symmetric bilateral perinephric stranding, Braydan Marriott nonspecific finding which may correlate with advanced age or decreased renal function though could assess for urinary symptoms and consider urinalysis if present. 6. Aortic Atherosclerosis (ICD10-I70.0). Electronically Signed   By: Lovena Le M.D.   On: 07/17/2020 01:07        Scheduled Meds: . atorvastatin  40 mg Oral QHS  . chlorpheniramine-HYDROcodone  5 mL Oral QHS  . enoxaparin (LOVENOX) injection  40 mg Subcutaneous Q24H  . insulin detemir  30 Units Subcutaneous BID  . metoprolol tartrate  25 mg Oral BID  . nystatin   Topical TID  . sertraline  50 mg Oral Daily   Continuous Infusions: . sodium chloride 125 mL/hr at 07/17/20 1434     LOS: 0 days    Time spent: over 30 min    Fayrene Helper, MD [facility]   To contact the attending provider between 7A-7P or the covering provider during  after hours 7P-7A, please log into the web site [email or web address] and access using universal [place] password for that web site. If you do not have the password, please call the hospital operator.  07/17/2020, 4:09 PM

## 2020-07-17 NOTE — ED Notes (Signed)
Assisted pt to bedside commode, pt uses her cane to walk

## 2020-07-17 NOTE — Progress Notes (Signed)
PT Cancellation Note  Patient Details Name: [name] MRN: [id number] DOB: 1955/11/13   Cancelled Treatment:    Reason Eval/Treat Not Completed: Other (comment). Consult received and chart reviewed. Pt symptomatic for Covid. 1st test negative, pending 2nd test, still on isolation. Per chart review and discussion with RN, pt ambulating on RA without assistance in room. No formalized need for PT indicated at this time. Will sign off and dc current orders. Please reorder if/when needed again.   Peggy Loge 07/17/2020, 11:44 AM Greggory Stallion, PT, DPT [phone]

## 2020-07-17 NOTE — ED Notes (Signed)
Pt up to the restroom, no assistance required at this time.

## 2020-07-17 NOTE — Progress Notes (Signed)
Respiratory panel completed and walked to lab. Patient aware of the test, verbalized understanding. Will continue to monitor.

## 2020-07-17 NOTE — Plan of Care (Signed)
  Problem: Education: Goal: Ability to describe self-care measures that may prevent or decrease complications (Diabetes Survival Skills Education) will improve Outcome: Progressing Goal: Individualized Educational Video(s) Outcome: Progressing   Problem: Coping: Goal: Ability to adjust to condition or change in health will improve Outcome: Progressing

## 2020-07-18 ENCOUNTER — Encounter: Payer: Self-pay | Admitting: Internal Medicine

## 2020-07-18 ENCOUNTER — Observation Stay (HOSPITAL_BASED_OUTPATIENT_CLINIC_OR_DEPARTMENT_OTHER)
Admit: 2020-07-18 | Discharge: 2020-07-18 | Disposition: A | Discharge status: Home / Self Care | Payer: Medicare HMO | Attending: Family Medicine | Admitting: Family Medicine

## 2020-07-18 ENCOUNTER — Observation Stay: Payer: Medicare HMO

## 2020-07-18 DIAGNOSIS — R0602 Shortness of breath: Secondary | ICD-10-CM | POA: Diagnosis not present

## 2020-07-18 DIAGNOSIS — R7989 Other specified abnormal findings of blood chemistry: Secondary | ICD-10-CM | POA: Diagnosis not present

## 2020-07-18 DIAGNOSIS — R059 Cough, unspecified: Secondary | ICD-10-CM | POA: Diagnosis not present

## 2020-07-18 DIAGNOSIS — J189 Pneumonia, unspecified organism: Secondary | ICD-10-CM | POA: Diagnosis not present

## 2020-07-18 DIAGNOSIS — I4891 Unspecified atrial fibrillation: Secondary | ICD-10-CM

## 2020-07-18 DIAGNOSIS — J9811 Atelectasis: Secondary | ICD-10-CM | POA: Diagnosis not present

## 2020-07-18 LAB — COMPREHENSIVE METABOLIC PANEL
ALT: 14 U/L (ref 0–44)
AST: 20 U/L (ref 15–41)
Albumin: 2.5 g/dL — ABNORMAL LOW (ref 3.5–5.0)
Alkaline Phosphatase: 84 U/L (ref 38–126)
Anion gap: 8 (ref 5–15)
BUN: 17 mg/dL (ref 8–23)
CO2: 20 mmol/L — ABNORMAL LOW (ref 22–32)
Calcium: 7.9 mg/dL — ABNORMAL LOW (ref 8.9–10.3)
Chloride: 105 mmol/L (ref 98–111)
Creatinine, Ser: 1.61 mg/dL — ABNORMAL HIGH (ref 0.44–1.00)
GFR, Estimated: 35 mL/min — ABNORMAL LOW (ref >60)
Glucose, Bld: 115 mg/dL — ABNORMAL HIGH (ref 70–99)
Potassium: 3.6 mmol/L (ref 3.5–5.1)
Sodium: 133 mmol/L — ABNORMAL LOW (ref 135–145)
Total Bilirubin: 1.4 mg/dL — ABNORMAL HIGH (ref 0.3–1.2)
Total Protein: 5.9 g/dL — ABNORMAL LOW (ref 6.5–8.1)

## 2020-07-18 LAB — ECHOCARDIOGRAM COMPLETE
Height: 63 in
S' Lateral: 2.71 cm
Weight: 2160 oz

## 2020-07-18 LAB — CBC WITH DIFFERENTIAL/PLATELET
Abs Immature Granulocytes: 0.05 10*3/uL (ref 0.00–0.07)
Basophils Absolute: 0 10*3/uL (ref 0.0–0.1)
Basophils Relative: 0 %
Eosinophils Absolute: 0.1 10*3/uL (ref 0.0–0.5)
Eosinophils Relative: 1 %
HCT: 26.6 % — ABNORMAL LOW (ref 36.0–46.0)
Hemoglobin: 8.2 g/dL — ABNORMAL LOW (ref 12.0–15.0)
Immature Granulocytes: 1 %
Lymphocytes Relative: 7 %
Lymphs Abs: 0.7 10*3/uL (ref 0.7–4.0)
MCH: 25.2 pg — ABNORMAL LOW (ref 26.0–34.0)
MCHC: 30.8 g/dL (ref 30.0–36.0)
MCV: 81.8 fL (ref 80.0–100.0)
Monocytes Absolute: 0.7 10*3/uL (ref 0.1–1.0)
Monocytes Relative: 7 %
Neutro Abs: 8.5 10*3/uL — ABNORMAL HIGH (ref 1.7–7.7)
Neutrophils Relative %: 84 %
Platelets: 132 10*3/uL — ABNORMAL LOW (ref 150–400)
RBC: 3.25 MIL/uL — ABNORMAL LOW (ref 3.87–5.11)
RDW: 17.6 % — ABNORMAL HIGH (ref 11.5–15.5)
WBC: 10.1 10*3/uL (ref 4.0–10.5)
nRBC: 0 % (ref 0.0–0.2)

## 2020-07-18 LAB — GLUCOSE, CAPILLARY: Glucose-Capillary: 241 mg/dL — ABNORMAL HIGH (ref 70–99)

## 2020-07-18 LAB — PROTIME-INR
INR: 1.4 — ABNORMAL HIGH (ref 0.8–1.2)
Prothrombin Time: 16.7 seconds — ABNORMAL HIGH (ref 11.4–15.2)

## 2020-07-18 LAB — PHOSPHORUS: Phosphorus: 2 mg/dL — ABNORMAL LOW (ref 2.5–4.6)

## 2020-07-18 LAB — SARS CORONAVIRUS 2 BY RT PCR (HOSPITAL ORDER, PERFORMED IN ~~LOC~~ HOSPITAL LAB): SARS Coronavirus 2: NEGATIVE

## 2020-07-18 LAB — PROCALCITONIN: Procalcitonin: 0.21 ng/mL

## 2020-07-18 LAB — C-REACTIVE PROTEIN: CRP: 14.8 mg/dL — ABNORMAL HIGH (ref <1.0)

## 2020-07-18 LAB — MAGNESIUM: Magnesium: 1.6 mg/dL — ABNORMAL LOW (ref 1.7–2.4)

## 2020-07-18 MED ORDER — IPRATROPIUM-ALBUTEROL 0.5-2.5 (3) MG/3ML IN SOLN
3.0000 mL | Freq: Four times a day (QID) | RESPIRATORY_TRACT | Status: DC
  Administered 2020-07-18: 13:00:00 3 mL via RESPIRATORY_TRACT
  Filled 2020-07-18 (×2): qty 3

## 2020-07-18 MED ORDER — ALBUTEROL SULFATE HFA 108 (90 BASE) MCG/ACT IN AERS: 2.0000 | INHALATION_SPRAY | RESPIRATORY_TRACT | 1 refills | Status: DC | PRN

## 2020-07-18 MED ORDER — DOXYCYCLINE HYCLATE 100 MG PO TABS
100.0000 mg | ORAL_TABLET | Freq: Two times a day (BID) | ORAL | Status: DC
  Administered 2020-07-18: 15:00:00 100 mg via ORAL
  Filled 2020-07-18: qty 1

## 2020-07-18 MED ORDER — MAGNESIUM SULFATE 2 GM/50ML IV SOLN: 2.0000 g | Freq: Once | INTRAVENOUS | Status: DC

## 2020-07-18 MED ORDER — SODIUM CHLORIDE 0.9 % IV SOLN
2.0000 g | INTRAVENOUS | Status: DC
  Administered 2020-07-18: 16:00:00 2 g via INTRAVENOUS
  Filled 2020-07-18: qty 20

## 2020-07-18 MED ORDER — PANTOPRAZOLE SODIUM 40 MG PO TBEC: 40.0000 mg | DELAYED_RELEASE_TABLET | Freq: Every day | ORAL | 0 refills | Status: DC

## 2020-07-18 MED ORDER — GUAIFENESIN ER 600 MG PO TB12
600.0000 mg | ORAL_TABLET | Freq: Two times a day (BID) | ORAL | Status: DC
  Administered 2020-07-18: 600 mg via ORAL
  Filled 2020-07-18 (×2): qty 1

## 2020-07-18 MED ORDER — DOXYCYCLINE MONOHYDRATE 100 MG PO TABS: 100.0000 mg | ORAL_TABLET | Freq: Two times a day (BID) | ORAL | 0 refills | Status: AC

## 2020-07-18 MED ORDER — ALBUTEROL SULFATE HFA 108 (90 BASE) MCG/ACT IN AERS
2.0000 | INHALATION_SPRAY | RESPIRATORY_TRACT | Status: DC | PRN
  Filled 2020-07-18: qty 6.7

## 2020-07-18 MED ORDER — SPACER/AERO-HOLD CHAMBER BAGS MISC: 1 refills | Status: DC

## 2020-07-18 MED ORDER — AMOXICILLIN 500 MG PO CAPS: 1000.0000 mg | ORAL_CAPSULE | Freq: Two times a day (BID) | ORAL | 0 refills | Status: AC

## 2020-07-18 MED ORDER — ENOXAPARIN SODIUM 30 MG/0.3ML ~~LOC~~ SOLN: 30.0000 mg | SUBCUTANEOUS | Status: DC

## 2020-07-18 NOTE — TOC Initial Note (Signed)
Transition of Care [facility]) - Initial/Assessment Note    Patient Details  Name: [name] MRN: [id number] Date of Birth: 1955/11/02  [facility] CM/SW Contact:    Shelbie Hutching, RN Phone Number: 07/18/2020, 11:25 AM  Clinical Narrative:                 Patient is under observation for shortness of breath, COVID negative.  Patient reports that she is from home where she lives with her husband, grandson, and daughter.  Patient uses a cane or her walker at home.  Patient is current with NP at [facility] and gets her prescriptions from [facility].   No discharge needs identified at this time.   Expected Discharge Plan: Home/Self Care Barriers to Discharge: Continued Medical Work up   Patient Goals and CMS Choice Patient states their goals for this hospitalization and ongoing recovery are:: to get back home      Expected Discharge Plan and Services Expected Discharge Plan: Home/Self Care   Discharge Planning Services: CM Consult   Living arrangements for the past 2 months: Mobile Home                   DME Agency: NA       HH Arranged: NA          Prior Living Arrangements/Services Living arrangements for the past 2 months: Mobile Home Lives with:: Spouse, Adult Children Patient language and need for interpreter reviewed:: Yes Do you feel safe going back to the place where you live?: Yes      Need for Family Participation in Patient Care: Yes (Comment) (Shortness of breath) Care giver support system in place?: Yes (comment) (husband, daughter, Ilona Sorrel) Current home services: DME (cane and walker) Criminal Activity/Legal Involvement Pertinent to Current Situation/Hospitalization: No - Comment as needed  Activities of Daily Living Home Assistive Devices/Equipment: Cane (specify quad or straight) ADL Screening (condition at time of admission) Patient's cognitive ability adequate to safely complete daily activities?: Yes Is the patient deaf  or have difficulty hearing?: No Does the patient have difficulty seeing, even when wearing glasses/contacts?: No Does the patient have difficulty concentrating, remembering, or making decisions?: No Patient able to express need for assistance with ADLs?: Yes Does the patient have difficulty dressing or bathing?: No Independently performs ADLs?: Yes (appropriate for developmental age) Does the patient have difficulty walking or climbing stairs?: Yes Weakness of Legs: Both Weakness of Arms/Hands: None  Permission Sought/Granted Permission sought to share information with : Case Manager, Family Supports Permission granted to share information with : Yes, Verbal Permission Granted              Emotional Assessment Appearance:: Appears stated age Attitude/Demeanor/Rapport: Engaged Affect (typically observed): Accepting Orientation: : Oriented to Self, Oriented to Place, Oriented to  Time, Oriented to Situation Alcohol / Substance Use: Not Applicable Psych Involvement: No (comment)  Admission diagnosis:  Shortness of breath [R06.02] Cough [R05.9] Clinical diagnosis of COVID-19 [U07.1] Patient Active Problem List   Diagnosis Date Noted  . Shortness of breath 07/16/2020  . Candidal intertrigo 07/16/2020  . Sepsis ([place]) 11/23/2019  . Acute pyelonephritis 09/30/2019  . Acute pain of left foot 08/18/2019  . Type II diabetes mellitus with renal manifestations ([place]) 08/13/2019  . Weakness of both lower extremities 07/31/2019  . Altered mental status 07/14/2019  . GAD (generalized anxiety disorder) 06/11/2019  . Poor memory 06/11/2019  . Constipation 05/25/2019  . Acute medial meniscus tear of  left knee 05/25/2019  . AKI (acute kidney injury) ([place]) 05/16/2019  . Closed fracture of left distal fibula 05/16/2019  . Sinus tachycardia 05/05/2019  . Congestive dilated cardiomyopathy ([place]) 04/14/2019  . CAD (coronary artery disease) 04/10/2019  . Closed fracture of lateral malleolus  04/10/2019  . Lumbar facet syndrome (Bilateral) (R>L) 03/09/2019  . Long term (current) use of anticoagulants 03/09/2019  . Unspecified cirrhosis of liver ([place]) 02/11/2019  . Hypoalbuminemia 02/11/2019  . Edema due to hypoalbuminemia 02/11/2019  . Lower extremity edema 01/26/2019  . Prolonged QT interval 12/17/2018  . Hypomagnesemia 12/17/2018  . Uncontrolled type 2 diabetes mellitus ([place]) 12/17/2018  . H/O TIA (transient ischemic attack) and stroke 12/17/2018  . Personal history of surgery to heart and great vessels, presenting hazards to health 12/17/2018  . DDD (degenerative disc disease), lumbar 12/17/2018  . Lumbar intervertebral disc displacement 12/17/2018  . Chronic musculoskeletal pain 12/17/2018  . Chronic generalized pain 12/17/2018  . Hyponatremia 12/17/2018  . Fibromyalgia syndrome 12/17/2018  . Other intervertebral disc displacement, lumbar region 12/17/2018  . Vitamin D deficiency 11/26/2018  . Elevated C-reactive protein (CRP) 11/26/2018  . Elevated sed rate 11/26/2018  . Chronic low back pain (Primary area of Pain) (Bilateral) (L>R) w/ sciatica (Bilateral) 11/25/2018  . Chronic lower extremity pain (Secondary Area of Pain) (Bilateral) (L>R) 11/25/2018  . Chronic neck pain 11/25/2018  . Pharmacologic therapy 11/25/2018  . Disorder of skeletal system 11/25/2018  . Problems influencing health status 11/25/2018  . Renal mass 10/06/2018  . Chronic intermittent abdominal pain 09/25/2018  . Unspecified abdominal pain 09/25/2018  . Frequent falls 09/12/2018  . Orthostatic hypotension 08/18/2018  . Normochromic normocytic anemia 08/18/2018  . Acute renal failure superimposed on stage 3a chronic kidney disease ([place]) 08/18/2018  . History of hypothyroidism 08/14/2018  . Medical non-compliance 08/14/2018  . Diabetic ketoacidosis ([place]) 05/22/2018  . UTI (urinary tract infection) 05/22/2018  . NSTEMI (non-ST elevated myocardial infarction) ([place]) 01/11/2016  . Tachy-brady  syndrome ([place]) 12/28/2015  . PAF (paroxysmal atrial fibrillation) ([place]) 11/24/2015  . Type 2 diabetes mellitus with neurologic complication ([place]) 62/69/4854  . S/P cholecystectomy 11/23/2015  . Narcotic abuse ([place]) 11/22/2015  . Chronic superficial gastritis without bleeding 11/22/2015  . History of Clostridium difficile 11/22/2015  . History of TIA (transient ischemic attack) 11/22/2015  . Mixed hyperlipidemia 11/22/2015  . Diverticulosis 11/22/2015  . History of uterine cancer 11/22/2015  . Hypothyroidism, unspecified 11/22/2015  . Intractable cyclical vomiting with nausea 11/22/2015  . Narcotic withdrawal ([place]) 11/11/2015  . Hyperlipidemia 08/31/2015  . Hypotension 08/06/2015  . Cryptogenic cirrhosis ([place]) 07/10/2015  . Atrial fibrillation ([place]) 07/10/2015  . Malnutrition of moderate degree 07/04/2015  . Symptomatic bradycardia 05/27/2015  . Chronic anemia 05/19/2015  . Thrombocytopenia ([place]) 05/19/2015  . Hypokalemia 04/06/2015  . Hyperlipidemia with target LDL less than 100 04/06/2015  . Syncope 03/11/2015  . CVA (cerebral vascular accident) ([place]) 02/15/2015  . Essential hypertension 01/12/2015  . Chronically on opiate therapy 01/12/2015  . Cirrhosis of liver not due to alcohol ([place]) 2016  . S/P Nissen fundoplication (without gastrostomy tube) procedure 05/11/2014  . Dysphagia 04/19/2014  . DEPRESSION/ANXIETY 06/27/2007  . Myofascial pain syndrome 06/27/2007  . Chronic pain syndrome 03/28/2007  . GERD 03/27/2007  . Diverticulosis of large intestine without perforation or abscess without bleeding 03/27/2007  . Proteinuria 03/27/2007   PCP:  [name], NP Pharmacy:   [facility] [place]  [place] Phone: [phone] Fax: [phone]     Social Determinants of Health (SDOH) Interventions    Readmission Risk Interventions Readmission Risk Prevention Plan 10/01/2019 07/18/2019 07/16/2019   Transportation Screening Complete Complete Complete  Medication Review Press photographer) Complete Complete Complete  PCP or Specialist appointment within 3-5 days of discharge Complete Complete -  [place] or Home Care Consult Complete Complete Complete  SW Recovery Care/Counseling Consult Complete Complete -  Palliative Care Screening Not Applicable Not Applicable Not [place] - Not Applicable Patient Refused  Some recent data might be hidden

## 2020-07-18 NOTE — Discharge Summary (Signed)
Physician Discharge Summary  [name] TXH:[id number] DOB: 06-06-1956 DOA: 07/16/2020  PCP: [name], NP  Admit date: 07/16/2020 Discharge date: 07/18/2020  Time spent: 40 minutes  Recommendations for Outpatient Follow-up:  1. Follow outpatient CBC/CMP 2. Follow response with antibiotics, albuterol inhaler (she notes significant improvement with breathing treatments, consider evaluation with PFT's after acute illness has resolved) 3. Evidence of esophagitis on CT scan, continue PPI at discharge, follow with GI outpatient 4. Consider outpatient speech evaluation outpatient - CT with concern for mucous plugging or aspiration 5. Follow repeat UA outpatient  6. Follow cirrhosis outpatient  7. Follow echo results with PCP outpatient 8. Hx afib, follow with cards outpatient whether she needs anticoagulation 9. Elevated CRP, consider following after abx  Discharge Diagnoses:  Active Problems:   Atrial fibrillation (HCC)   History of TIA (transient ischemic attack)   Hyperlipidemia   CAD (coronary artery disease)   Shortness of breath   Candidal intertrigo   Discharge Condition: stable  Diet recommendation: carb mod  Filed Weights   07/16/20 1829  Weight: 61.2 kg    History of present illness:  See admission H&P for additional details [name] [name] 64 y.o.femalewith medical history significant forDM1 on levamir 30 u in AM and PM, hyperlipidemia on atorvastatin 40 mg qhs, back pain on flexeril, depression on sertraline 50 mg daily, on metoprololtartrate for afib rate control.  She was admitted for shortness of breath, cough, change in taste/smell.  Symptoms initially concerning for COVID, though she's tested negative x2.  She continued to have cough and SOB, though notes her symptoms improved significantly after [name] breathing treatment.  CXR on 11/9 showed L basilar airspace disease (atelectasis or pneumonia).  She was given [name] dose of IV abx and discharged with  abx for CAP.  She was discharged with albuterol to use for shortness of breath.  No steroids given at discharge given hx of allergies and hx of diabetes and substantial response to nebs alone.    See below for additional details   Hospital Course:  Shortness of breath  Community Acquired Pneumonia - sx significantly improved after breathing treatment, will not d/c with steroids given hx allergy and substantial response to nebs (as well as hx diabetes) - CXR 11/8 with L basilar airspace disease -- CT PE protocol without evidence of PE, possible esophagitis, possible mucous plugging or aspiration - stigmata of cirrhosis, mild symmeric bilateral perinephric stranding (nonspecific) -- sx concerning for covid, but she's vaccinated with 2 negative tests during this admission.  Negative RVP. -- will follow echo, elevated BNP (EF 60-65%, No RWMA, moderate LVH) - appears euvolemic, follow outpatient with PCP -- CRP elevated, procal not impressive.  CRP could be elevated in setting of pneumonia.  Follow with abx. -- she's doing well on RA, plan for d/c today with albuterol and abx  History of paroxysmal atrial fibrillation-on metoprolol tartrate, patient reports she is not on anticoagulation -Per [facility] note, she was previously on apixaban 5 mg twice daily in 2017 -Patient states she is currently not taking anticoagulation, and she does not know why -She states that it has been over [name] year since she saw her cardiologist due to Covid pandemic - will need to follow up with cards for discussion about long term anticoagulation   Left under breastintertrigo-present on admission, nystatin powder,apply to clean dry skin twice daily  CKD IIIb: renal function appears close to baseline  Diabetes type1-she says she takes Levemir 30 units twice daily,  no short-acting coverage -Insulin detemir 30 units twice daily ordered  Hyponatremia - mild, follow outpatiennt   History of  levothyroxine-previously on Synthroid 75 mcg daily  Hyperlipidemia-atorvastatin 40 nightly  Depression-sertraline 50 mg daily  Anemia/thrombocytopenia: appear chronic and close to baseline  - follow outpatient  Procedures: Echo IMPRESSIONS    1. Left ventricular ejection fraction, by estimation, is 60 to 65%. The  left ventricle has normal function. The left ventricle has no regional  wall motion abnormalities. There is moderate left ventricular hypertrophy.  Left ventricular diastolic function  could not be evaluated.  2. Right ventricular systolic function is normal. The right ventricular  size is normal. Tricuspid regurgitation signal is inadequate for assessing  PA pressure.  3. The mitral valve is normal in structure. No evidence of mitral valve  regurgitation. No evidence of mitral stenosis.  4. The aortic valve is normal in structure. Aortic valve regurgitation is  not visualized. Mild aortic valve sclerosis is present, with no evidence  of aortic valve stenosis.  5. No apical window and no subcostal window. Limited images.  Consultations:  none  Discharge Exam: Vitals:   07/18/20 1229 07/18/20 1249  BP: 136/60   Pulse: (!) 102   Resp: 19   Temp: 98.4 F (36.9 C)   SpO2: 97% 96%   Feels better after breathing treatment, eager for discharge home  General: No acute distress. Cardiovascular: Heart sounds show [name] regular rate, and rhythm. Lungs: coarse cough, no wheezing appreciated Abdomen: Soft, nontender, nondistended  Neurological: Alert and oriented 3. Moves all extremities 4 . Cranial nerves II through XII grossly intact. Skin: Warm and dry. No rashes or lesions. Extremities: No clubbing or cyanosis. No edema.   Discharge Instructions   Discharge Instructions    Call MD for:  difficulty breathing, headache or visual disturbances   Complete by: As directed    Call MD for:  extreme fatigue   Complete by: As directed    Call MD for:  hives    Complete by: As directed    Call MD for:  persistant dizziness or light-headedness   Complete by: As directed    Call MD for:  persistant nausea and vomiting   Complete by: As directed    Call MD for:  redness, tenderness, or signs of infection (pain, swelling, redness, odor or green/yellow discharge around incision site)   Complete by: As directed    Call MD for:  severe uncontrolled pain   Complete by: As directed    Call MD for:  temperature >100.4   Complete by: As directed    Diet - low sodium heart healthy   Complete by: As directed    Diet Carb Modified   Complete by: As directed    Discharge instructions   Complete by: As directed    You were seen for cough and shortness of breath.  You probably have early pneumonia or bronchitis.  We'll treat you with an albuterol inhaler (use this around the clock, every 4 hours while you're awake, to help with your shortness of breath).  We'll send you with antibiotics for pneumonia.  You CT scan mentioned esophagitis, we've started you on an acid blocker (protonix).  Please follow up with your PCP regarding this outpatient.  You should see Hanifah Royse gastroenterologist.  You should see Marquize Seib speech therapist outpatient given you CT scan showed signs of possible aspiration.  Follow your echo results with your PCP outpatient.  Follow your cirrhosis with your primary provider outpatient.  Please follow up with your PCP or cardiologist regarding your atrial fibrillation and whether you should be on Fynley Chrystal blood thinner.  Follow up with your PCP in Kiamesha Samet few days.  Return for new, recurrent, or worsening symptoms.  Please ask your PCP to request records from this hospitalization so they know what was done and what the next steps will be.   Increase activity slowly   Complete by: As directed      Allergies as of 07/18/2020      Reactions   Aspirin Rash   Codeine Sulfate Rash   Erythromycin Rash   Reaction:  Fever    Prednisone Swelling   Rosiglitazone  Maleate Swelling   Tetanus-diphtheria Toxoids Td Rash, Other (See Comments)   Reaction:  Fever       Medication List    TAKE these medications   Accu-Chek Aviva Plus test strip Generic drug: glucose blood Use as instructed to test blood sugar up to 4 times daily   albuterol 108 (90 Base) MCG/ACT inhaler Commonly known as: VENTOLIN HFA Inhale 2 puffs into the lungs every 4 (four) hours as needed for wheezing or shortness of breath. What changed: when to take this   amoxicillin 500 MG capsule Commonly known as: AMOXIL Take 2 capsules (1,000 mg total) by mouth 2 (two) times daily for 4 days. Start taking on: July 19, 2020   atorvastatin 40 MG tablet Commonly known as: LIPITOR Take 40 mg by mouth at bedtime.   cholecalciferol 25 MCG (1000 UNIT) tablet Commonly known as: VITAMIN D3 Take 1,000 Units by mouth daily.   cyclobenzaprine 10 MG tablet Commonly known as: FLEXERIL Take 1 tablet (10 mg total) by mouth 3 (three) times daily as needed for muscle spasms. Must last 30 days.   doxycycline 100 MG tablet Commonly known as: ADOXA Take 1 tablet (100 mg total) by mouth 2 (two) times daily for 5 days.   Levemir FlexTouch 100 UNIT/ML FlexPen Generic drug: insulin detemir INJECT 30 UNITS SUBCUTANEOSLY EVERY MORNING AND AT BEDTIME   magnesium gluconate 500 MG tablet Commonly known as: MAGONATE Take 500 mg by mouth 2 (two) times daily.   metoprolol tartrate 25 MG tablet Commonly known as: LOPRESSOR Take 25 mg by mouth 2 (two) times daily.   nitroGLYCERIN 0.3 MG SL tablet Commonly known as: Nitrostat Place 1 tablet (0.3 mg total) under the tongue every 5 (five) minutes as needed for chest pain.   oxyCODONE 5 MG immediate release tablet Commonly known as: Roxicodone Take 1 tablet (5 mg total) by mouth every 4 (four) hours as needed for severe pain.   pantoprazole 40 MG tablet Commonly known as: PROTONIX Take 1 tablet (40 mg total) by mouth daily.   sertraline 50 MG  tablet Commonly known as: ZOLOFT TAKE ONE TABLET EVERY DAY   Spacer/Aero-Hold Chamber Bags Misc Use spacer with inhaler when you have shortness of breath      Allergies  Allergen Reactions  . Aspirin Rash  . Codeine Sulfate Rash  . Erythromycin Rash    Reaction:  Fever   . Prednisone Swelling  . Rosiglitazone Maleate Swelling  . Tetanus-Diphtheria Toxoids Td Rash and Other (See Comments)    Reaction:  Fever       The results of significant diagnostics from this hospitalization (including imaging, microbiology, ancillary and laboratory) are listed below for reference.    Significant Diagnostic Studies: DG Chest 2 View  Result Date: 07/16/2020 CLINICAL DATA:  Chest pain, shortness of breath for  5 days, congestion, fever, neighbor recently tested positive for COVID-19 EXAM: CHEST - 2 VIEW COMPARISON:  07/01/2020 FINDINGS: Normal heart size, mediastinal contours, and pulmonary vascularity. Atherosclerotic calcification aorta. Minimal atelectasis at RIGHT base. Remaining lungs clear. No acute infiltrate, pleural effusion, or pneumothorax. Osseous structures unremarkable. IMPRESSION: Minimal RIGHT basilar atelectasis. Aortic Atherosclerosis (ICD10-I70.0). Electronically Signed   By: Lavonia Dana M.D.   On: 07/16/2020 19:42   DG Chest 2 View  Result Date: 07/01/2020 CLINICAL DATA:  Cough, stabbing chest pain, and shortness of breath for 4 days EXAM: CHEST - 2 VIEW COMPARISON:  02/20/2020 FINDINGS: The heart size and mediastinal contours are within normal limits. Both lungs are clear. The visualized skeletal structures are unremarkable. Calcification of the aorta. IMPRESSION: No active cardiopulmonary disease. Electronically Signed   By: Lucienne Capers M.D.   On: 07/01/2020 22:32   CT Angio Chest PE W and/or Wo Contrast  Result Date: 07/17/2020 CLINICAL DATA:  Shortness of breath, tachycardia, concern for pulmonary embolism EXAM: CT ANGIOGRAPHY CHEST WITH CONTRAST TECHNIQUE:  Multidetector CT imaging of the chest was performed using the standard protocol during bolus administration of intravenous contrast. Multiplanar CT image reconstructions and MIPs were obtained to evaluate the vascular anatomy. CONTRAST:  26m OMNIPAQUE IOHEXOL 350 MG/ML SOLN COMPARISON:  CT 02/21/2020 FINDINGS: Cardiovascular: Satisfactory opacification the pulmonary arteries to the segmental level. No pulmonary artery filling defects are identified. Central pulmonary arteries are normal caliber. The aortic root is suboptimally assessed given cardiac pulsation artifact. Atherosclerotic plaque within the normal caliber aorta. No acute luminal abnormality of the imaged aorta. No periaortic stranding or hemorrhage. Normal 3 vessel branching of the aortic arch. Proximal great vessels are tortuous and mildly calcified but otherwise unremarkable. No major venous abnormality is seen. Cardiac size is within normal limits. Trace pericardial fluid. Mediastinum/Nodes: No mediastinal fluid or gas. Normal thyroid gland and thoracic inlet. No acute abnormality of the trachea. There is some mild circumferential thickening of the lower thoracic esophagus with some faint paraesophageal stranding. No extraluminal gas or collection is seen. No worrisome mediastinal, hilar or axillary adenopathy. Lungs/Pleura: Lung volumes are low likely accentuated by imaging during exhalation. There are few fluid-filled airways seen in the medial basilar segment of the left lower lobe which could reflect mucous plugging or aspiration. No consolidative airspace opacity is seen. No convincing features of edema. No pneumothorax or effusion. No worrisome pulmonary nodules or masses. Upper Abdomen: Splenomegaly is similar to comparison. Prior cholecystectomy. Mildly heterogenous and nodular liver compatible with stigmata of cirrhosis. Upper abdominal venous collateralization is noted. Mild symmetric bilateral perinephric stranding, [name] nonspecific finding  which may correlate with advanced age or decreased renal function. Musculoskeletal: No acute or worrisome chest wall or osseous abnormality. Mild degenerative changes in the spine. Scoliotic curvature of the spine. No worrisome chest wall masses or lesions. Review of the MIP images confirms the above findings. IMPRESSION: 1. No evidence of acute pulmonary artery filling defects to suggest pulmonary embolism. 2. Mild circumferential thickening of the lower thoracic esophagus with some faint paraesophageal stranding, suggestive of esophagitis. No extraluminal gas or collection is seen. 3. Few fluid-filled airways in the medial basilar segment of the left lower lobe could reflect mucous plugging or aspiration. No consolidative airspace opacity is seen to suggest pneumonia. 4. Stigmata of cirrhosis and sequela of portal hypertension including splenomegaly and upper abdominal venous collateralization. 5. Mild symmetric bilateral perinephric stranding, [name] nonspecific finding which may correlate with advanced age or decreased renal function though could assess for  urinary symptoms and consider urinalysis if present. 6. Aortic Atherosclerosis (ICD10-I70.0). Electronically Signed   By: Lovena Le M.D.   On: 07/17/2020 01:07   DG Chest Port 1 View  Result Date: 07/18/2020 CLINICAL DATA:  Dry cough. EXAM: PORTABLE CHEST 1 VIEW COMPARISON:  PA and lateral chest 07/16/2020 and 07/01/2020. CT chest 07/17/2020. FINDINGS: There is left basilar airspace disease. The right lung is clear. Heart size is normal. No pneumothorax or pleural effusion. IMPRESSION: Left basilar airspace disease which could be due to atelectasis or pneumonia. Electronically Signed   By: Inge Rise M.D.   On: 07/18/2020 10:59   ECHOCARDIOGRAM COMPLETE  Result Date: 07/18/2020    ECHOCARDIOGRAM REPORT   Patient Name:   VIRGINIE JOSTEN Date of Exam: 07/18/2020 Medical Rec #:  [id number]       Height:       63.0 in Accession #:    [id number]       Weight:       135.0 lb Date of Birth:  08-26-1956       BSA:          1.636 m Patient Age:    1 years        BP:           99/65 mmHg Patient Gender: F               HR:           90 bpm. Exam Location:  [facility] Procedure: 2D Echo, Cardiac Doppler and Color Doppler Indications:     Elevated brain natriurectic peptide  History:         Patient has prior history of Echocardiogram examinations, most                  recent 12/29/2017. TIA, Signs/Symptoms:Syncope; Risk                  Factors:Hypertension.  Sonographer:     Sherrie Sport RDCS (AE) Referring Phys:  [id number] Alben Deeds JR Diagnosing Phys: Kathlyn Sacramento MD  Sonographer Comments: No apical window and no subcostal window. IMPRESSIONS  1. Left ventricular ejection fraction, by estimation, is 60 to 65%. The left ventricle has normal function. The left ventricle has no regional wall motion abnormalities. There is moderate left ventricular hypertrophy. Left ventricular diastolic function  could not be evaluated.  2. Right ventricular systolic function is normal. The right ventricular size is normal. Tricuspid regurgitation signal is inadequate for assessing PA pressure.  3. The mitral valve is normal in structure. No evidence of mitral valve regurgitation. No evidence of mitral stenosis.  4. The aortic valve is normal in structure. Aortic valve regurgitation is not visualized. Mild aortic valve sclerosis is present, with no evidence of aortic valve stenosis.  5. No apical window and no subcostal window. Limited images. FINDINGS  Left Ventricle: Left ventricular ejection fraction, by estimation, is 60 to 65%. The left ventricle has normal function. The left ventricle has no regional wall motion abnormalities. The left ventricular internal cavity size was normal in size. There is  moderate left ventricular hypertrophy. Left ventricular diastolic function could not be evaluated. Right Ventricle: The right ventricular size is normal. No increase in right  ventricular wall thickness. Right ventricular systolic function is normal. Tricuspid regurgitation signal is inadequate for assessing PA pressure. Left Atrium: Left atrial size was normal in size. Right Atrium: Right atrial size was normal in size. Pericardium: There is no evidence of pericardial  effusion. Mitral Valve: The mitral valve is normal in structure. No evidence of mitral valve regurgitation. No evidence of mitral valve stenosis. Tricuspid Valve: The tricuspid valve is normal in structure. Tricuspid valve regurgitation is not demonstrated. No evidence of tricuspid stenosis. Aortic Valve: The aortic valve is normal in structure. Aortic valve regurgitation is not visualized. Mild aortic valve sclerosis is present, with no evidence of aortic valve stenosis. Pulmonic Valve: The pulmonic valve was normal in structure. Pulmonic valve regurgitation is not visualized. No evidence of pulmonic stenosis. Aorta: The aortic root is normal in size and structure. Venous: The inferior vena cava was not well visualized. IAS/Shunts: No atrial level shunt detected by color flow Doppler.  LEFT VENTRICLE PLAX 2D LVIDd:         4.25 cm LVIDs:         2.71 cm LV PW:         1.03 cm LV IVS:        1.48 cm LVOT diam:     2.20 cm LVOT Area:     3.80 cm  LEFT ATRIUM         Index LA diam:    3.90 cm 2.38 cm/m                        PULMONIC VALVE AORTA                 PV Vmax:        0.94 m/s Ao Root diam: 3.10 cm PV Peak grad:   3.6 mmHg                       RVOT Peak grad: 3 mmHg   SHUNTS Systemic Diam: 2.20 cm Kathlyn Sacramento MD Electronically signed by Kathlyn Sacramento MD Signature Date/Time: 07/18/2020/2:27:31 PM    Final     Microbiology: Recent Results (from the past 240 hour(s))  Respiratory Panel by RT PCR (Flu Elisa Sorlie&B, Covid) - Nasopharyngeal Swab     Status: None   Collection Time: 07/16/20  7:38 PM   Specimen: Nasopharyngeal Swab  Result Value Ref Range Status   SARS Coronavirus 2 by RT PCR NEGATIVE NEGATIVE Final     Comment: (NOTE) SARS-CoV-2 target nucleic acids are NOT DETECTED.  The SARS-CoV-2 RNA is generally detectable in upper respiratoy specimens during the acute phase of infection. The lowest concentration of SARS-CoV-2 viral copies this assay can detect is 131 copies/mL. [name] negative result does not preclude SARS-Cov-2 infection and should not be used as the sole basis for treatment or other patient management decisions. Paulmichael Schreck negative result may occur with  improper specimen collection/handling, submission of specimen other than nasopharyngeal swab, presence of viral mutation(s) within the areas targeted by this assay, and inadequate number of viral copies (<131 copies/mL). Brandyn Thien negative result must be combined with clinical observations, patient history, and epidemiological information. The expected result is Negative.  Fact Sheet for Patients:  PinkCheek.be  Fact Sheet for Healthcare Providers:  GravelBags.it  This test is no t yet approved or cleared by the [place] FDA and  has been authorized for detection and/or diagnosis of SARS-CoV-2 by FDA under an Emergency Use Authorization (EUA). This EUA will remain  in effect (meaning this test can be used) for the duration of the COVID-19 declaration under Section 564(b)(1) of the Act, 21 U.S.C. section 360bbb-3(b)(1), unless the authorization is terminated or revoked sooner.     Influenza [name] by PCR  NEGATIVE NEGATIVE Final   Influenza B by PCR NEGATIVE NEGATIVE Final    Comment: (NOTE) The Xpert Xpress SARS-CoV-2/FLU/RSV assay is intended as an aid in  the diagnosis of influenza from Nasopharyngeal swab specimens and  should not be used as Norell Brisbin sole basis for treatment. Nasal washings and  aspirates are unacceptable for Xpert Xpress SARS-CoV-2/FLU/RSV  testing.  Fact Sheet for Patients: PinkCheek.be  Fact Sheet for Healthcare  Providers: GravelBags.it  This test is not yet approved or cleared by the [place] FDA and  has been authorized for detection and/or diagnosis of SARS-CoV-2 by  FDA under an Emergency Use Authorization (EUA). This EUA will remain  in effect (meaning this test can be used) for the duration of the  Covid-19 declaration under Section 564(b)(1) of the Act, 21  U.S.C. section 360bbb-3(b)(1), unless the authorization is  terminated or revoked. Performed at [facility], [address] [place], [place] [address]   Respiratory Panel by PCR     Status: None   Collection Time: 07/17/20 11:57 AM  Result Value Ref Range Status   Adenovirus NOT DETECTED NOT DETECTED Final   Coronavirus 229E NOT DETECTED NOT DETECTED Final    Comment: (NOTE) The Coronavirus on the Respiratory Panel, DOES NOT test for the novel  Coronavirus (2019 nCoV)    Coronavirus HKU1 NOT DETECTED NOT DETECTED Final   Coronavirus NL63 NOT DETECTED NOT DETECTED Final   Coronavirus OC43 NOT DETECTED NOT DETECTED Final   Metapneumovirus NOT DETECTED NOT DETECTED Final   Rhinovirus / Enterovirus NOT DETECTED NOT DETECTED Final   Influenza Kloie Whiting NOT DETECTED NOT DETECTED Final   Influenza B NOT DETECTED NOT DETECTED Final   Parainfluenza Virus 1 NOT DETECTED NOT DETECTED Final   Parainfluenza Virus 2 NOT DETECTED NOT DETECTED Final   Parainfluenza Virus 3 NOT DETECTED NOT DETECTED Final   Parainfluenza Virus 4 NOT DETECTED NOT DETECTED Final   Respiratory Syncytial Virus NOT DETECTED NOT DETECTED Final   Bordetella pertussis NOT DETECTED NOT DETECTED Final   Chlamydophila pneumoniae NOT DETECTED NOT DETECTED Final   Mycoplasma pneumoniae NOT DETECTED NOT DETECTED Final    Comment: Performed at [facility], [place] [address], [place], [place] [address]  SARS Coronavirus 2 by RT PCR (hospital order, performed in [facility] hospital lab)     Status: None   Collection Time: 07/17/20  11:57 AM  Result Value Ref Range Status   SARS Coronavirus 2 NEGATIVE NEGATIVE Final    Comment: (NOTE) SARS-CoV-2 target nucleic acids are NOT DETECTED.  The SARS-CoV-2 RNA is generally detectable in upper and lower respiratory specimens during the acute phase of infection. The lowest concentration of SARS-CoV-2 viral copies this assay can detect is 250 copies / mL. [name] negative result does not preclude SARS-CoV-2 infection and should not be used as the sole basis for treatment or other patient management decisions.  [name] negative result may occur with improper specimen collection / handling, submission of specimen other than nasopharyngeal swab, presence of viral mutation(s) within the areas targeted by this assay, and inadequate number of viral copies (<250 copies / mL). [name] negative result must be combined with clinical observations, patient history, and epidemiological information.  Fact Sheet for Patients:   StrictlyIdeas.no  Fact Sheet for Healthcare Providers: [email or web address]  This test is not yet approved or  cleared by the [place] FDA and has been authorized for detection and/or diagnosis of SARS-CoV-2 by FDA under an Emergency Use Authorization (EUA).  This EUA will remain in effect (meaning this test can be used) for the duration of the COVID-19 declaration under Section 564(b)(1) of the Act, 21 U.S.C. section 360bbb-3(b)(1), unless the authorization is terminated or revoked sooner.  Performed at [facility], [place] [address], [place], [place] [address]      Labs: Basic Metabolic Panel: Recent Labs  Lab 07/16/20 1842 07/17/20 0610 07/18/20 0521  NA 129* 130* 133*  K 3.6 3.9 3.6  CL 97* 102 105  CO2 21* 21* 20*  GLUCOSE 362* 287* 115*  BUN 13 15 17   CREATININE 1.34* 1.40* 1.61*  CALCIUM 8.3* 8.0* 7.9*  MG  --   --  1.6*  PHOS  --   --  2.0*   Liver Function Tests: Recent Labs  Lab 07/18/20 0521   AST 20  ALT 14  ALKPHOS 84  BILITOT 1.4*  PROT 5.9*  ALBUMIN 2.5*   No results for input(s): LIPASE, AMYLASE in the last 168 hours. No results for input(s): AMMONIA in the last 168 hours. CBC: Recent Labs  Lab 07/16/20 1842 07/17/20 0610 07/18/20 0521  WBC 10.2 9.8 10.1  NEUTROABS  --   --  8.5*  HGB 10.7* 9.0* 8.2*  HCT 35.2* 28.4* 26.6*  MCV 82.4 80.9 81.8  PLT 139* 137* 132*   Cardiac Enzymes: No results for input(s): CKTOTAL, CKMB, CKMBINDEX, TROPONINI in the last 168 hours. BNP: BNP (last 3 results) Recent Labs    07/17/20 0610  BNP 132.0*    ProBNP (last 3 results) No results for input(s): PROBNP in the last 8760 hours.  CBG: Recent Labs  Lab 07/17/20 0930 07/17/20 1845 07/17/20 2146  GLUCAP 278* 201* 173*       Signed:  Fayrene Helper MD.  [facility] 07/18/2020, 3:05 PM

## 2020-07-18 NOTE — Progress Notes (Signed)
PHARMACIST - PHYSICIAN COMMUNICATION  CONCERNING:  Enoxaparin (Lovenox) for DVT Prophylaxis    RECOMMENDATION: Patient was prescribed enoxaprin 65m q24 hours for VTE prophylaxis.   Filed Weights   07/16/20 1829  Weight: 61.2 kg (135 lb)    Body mass index is 23.91 kg/m.  Estimated Creatinine Clearance: 28.4 mL/min (A) (by C-G formula based on SCr of 1.61 mg/dL (H)).  Patient is candidate for enoxaparin 354mevery 24 hours based on CrCl <3050min or Weight <45kg  DESCRIPTION: Pharm[facility]  Patient is now receiving enoxaparin 30 mg every 24 hours    ChaLu DuffelharmD Clinical Pharmacist  07/18/2020 11:28 AM

## 2020-07-18 NOTE — Progress Notes (Signed)
Patient received and understands discharge instructions

## 2020-07-18 NOTE — ED Notes (Signed)
Pt assisted to bedside toilet

## 2020-07-18 NOTE — Progress Notes (Signed)
*  PRELIMINARY RESULTS* Echocardiogram 2D Echocardiogram has been performed.  [name] 07/18/2020, 8:19 AM

## 2020-07-18 NOTE — ED Notes (Signed)
Pt resting in bed with lights dimmed and call bell in reach. NAD noted at this time.

## 2020-07-18 NOTE — Care Management Obs Status (Signed)
[place] NOTIFICATION   Patient Details  Name: [name] MRN: [id number] Date of Birth: June 12, 1956   Medicare Observation Status Notification Given:  Yes    Shelbie Hutching, RN 07/18/2020, 10:43 AM

## 2020-07-18 NOTE — Progress Notes (Signed)
Patient's saturation level stayed at 96% while ambulating on room air

## 2020-07-19 NOTE — Telephone Encounter (Signed)
Pt has FU appt on 07/20/20 scheduled.

## 2020-07-20 ENCOUNTER — Ambulatory Visit: Payer: Medicare HMO | Admitting: Primary Care

## 2020-08-03 ENCOUNTER — Encounter: Payer: Medicare HMO | Admitting: Pain Medicine

## 2020-08-05 IMAGING — CR DG NASAL BONES 3+V
1 series · 3 of 3 positions shown · non-contrast
Comparison: None.

CLINICAL DATA: Recent fall with nasal pain, initial encounter

EXAM:
NASAL BONES - 3+ VIEW

[Series 1: dg nasal bones · 0.14mm/px · 3 of 3 slices shown]
[im 1/3]
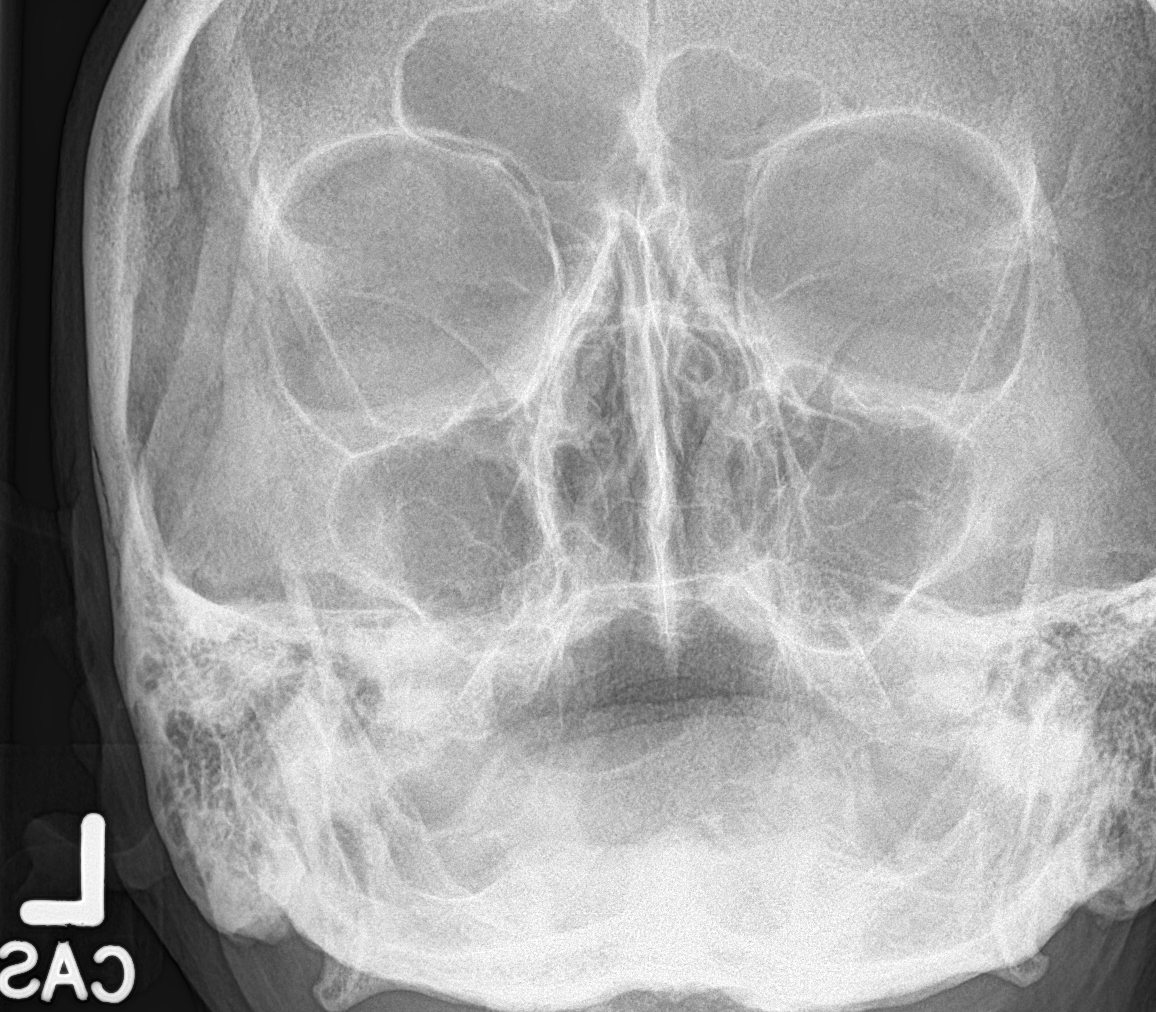
[im 2/3]
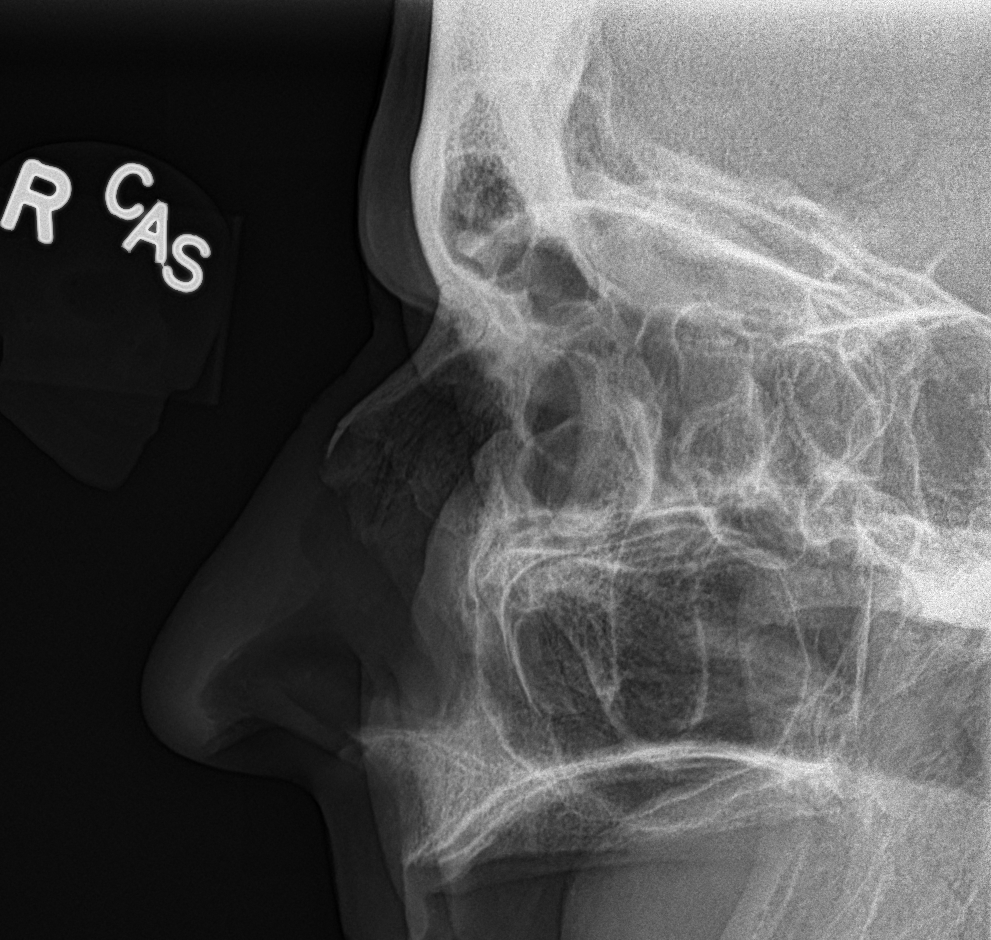
[im 3/3]
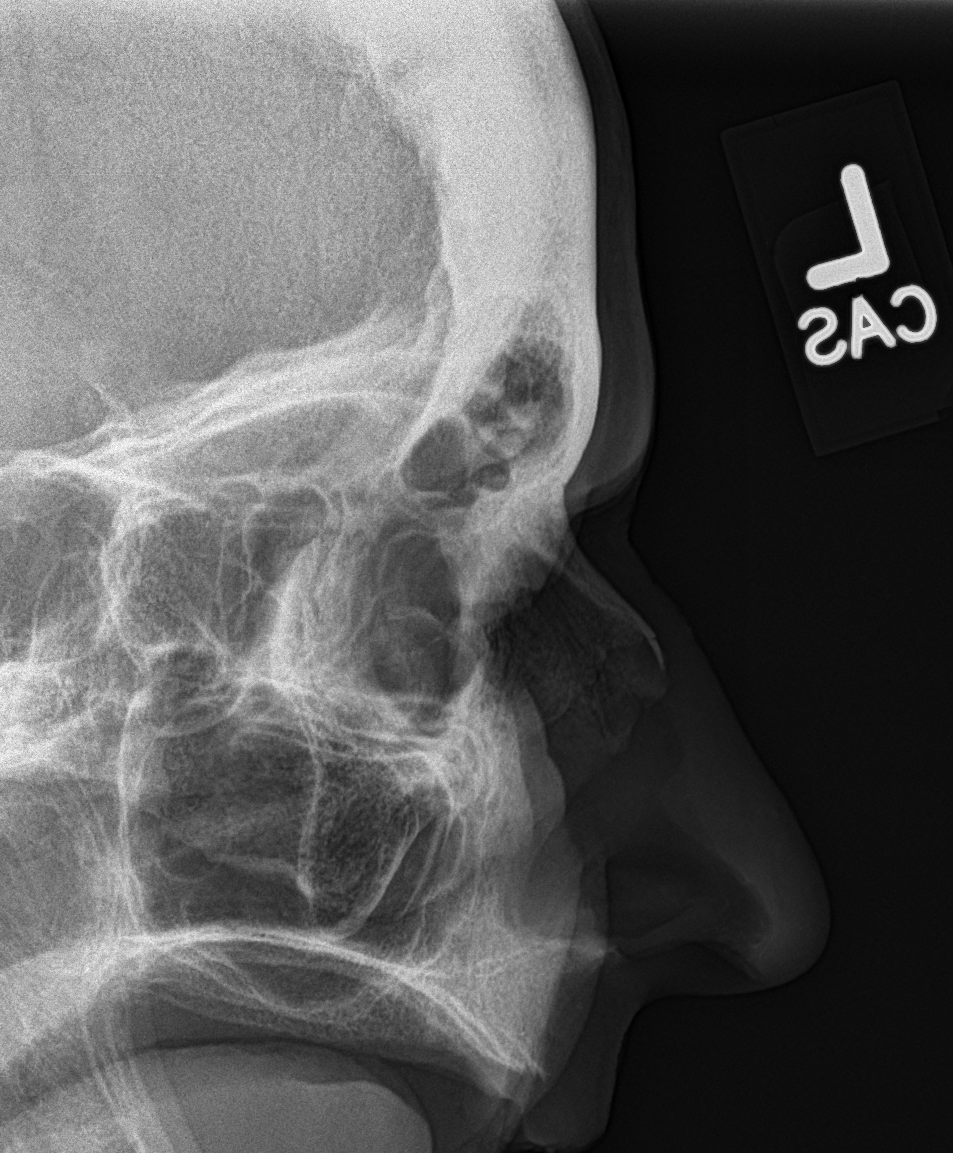

[3 of 3 positions shown; findings below may reference images not displayed]

FINDINGS: Paranasal sinuses are well aerated. Distal nasal bone fracture is
noted with only minimal downward displacement. The anterior nasal
spine is unremarkable.
IMPRESSION: Distal nasal bone fracture.

## 2020-08-09 ENCOUNTER — Telehealth: Payer: Self-pay

## 2020-08-09 ENCOUNTER — Encounter: Payer: Self-pay | Admitting: Pain Medicine

## 2020-08-09 NOTE — Telephone Encounter (Signed)
LM for patient to call back for pre virtual appointment questions.

## 2020-08-10 ENCOUNTER — Other Ambulatory Visit: Payer: Self-pay

## 2020-08-10 ENCOUNTER — Ambulatory Visit: Payer: Medicare HMO | Attending: Pain Medicine | Admitting: Pain Medicine

## 2020-08-10 DIAGNOSIS — M797 Fibromyalgia: Secondary | ICD-10-CM

## 2020-08-10 DIAGNOSIS — M47816 Spondylosis without myelopathy or radiculopathy, lumbar region: Secondary | ICD-10-CM

## 2020-08-10 DIAGNOSIS — G8929 Other chronic pain: Secondary | ICD-10-CM

## 2020-08-10 DIAGNOSIS — M5441 Lumbago with sciatica, right side: Secondary | ICD-10-CM | POA: Diagnosis not present

## 2020-08-10 DIAGNOSIS — M5442 Lumbago with sciatica, left side: Secondary | ICD-10-CM

## 2020-08-10 DIAGNOSIS — Z79899 Other long term (current) drug therapy: Secondary | ICD-10-CM

## 2020-08-10 DIAGNOSIS — G894 Chronic pain syndrome: Secondary | ICD-10-CM | POA: Diagnosis not present

## 2020-08-10 DIAGNOSIS — M79604 Pain in right leg: Secondary | ICD-10-CM | POA: Diagnosis not present

## 2020-08-10 DIAGNOSIS — M79605 Pain in left leg: Secondary | ICD-10-CM

## 2020-08-10 MED ORDER — OXYCODONE HCL 5 MG PO TABS: 5.0000 mg | ORAL_TABLET | Freq: Two times a day (BID) | ORAL | 0 refills | Status: DC

## 2020-08-10 MED ORDER — OXYCODONE HCL 5 MG PO TABS: 5.0000 mg | ORAL_TABLET | Freq: Three times a day (TID) | ORAL | 0 refills | Status: DC

## 2020-08-10 MED ORDER — OXYCODONE HCL 5 MG PO TABS: 5.0000 mg | ORAL_TABLET | Freq: Every day | ORAL | 0 refills | Status: DC

## 2020-08-10 MED ORDER — OXYCODONE HCL 5 MG PO TABS: 5.0000 mg | ORAL_TABLET | Freq: Four times a day (QID) | ORAL | 0 refills | Status: DC

## 2020-08-10 NOTE — Patient Instructions (Signed)
____________________________________________________________________________________________  Drug Holidays (Slow)  What is a "Drug Holiday"? Drug Holiday: is the name given to the period of time during which a patient stops taking a medication(s) for the purpose of eliminating tolerance to the drug.  Benefits . Improved effectiveness of opioids. . Decreased opioid dose needed to achieve benefits. . Improved pain with lesser dose.  What is tolerance? Tolerance: is the progressive decreased in effectiveness of a drug due to its repetitive use. With repetitive use, the body gets use to the medication and as a consequence, it loses its effectiveness. This is a common problem seen with opioid pain medications. As a result, a larger dose of the drug is needed to achieve the same effect that used to be obtained with a smaller dose.  How long should a "Drug Holiday" last? You should stay off of the pain medicine for at least 14 consecutive days. (2 weeks)  Should I stop the medicine "cold [place]"? No. You should always coordinate with your Pain Specialist so that he/she can provide you with the correct medication dose to make the transition as smoothly as possible.  How do I stop the medicine? Slowly. You will be instructed to decrease the daily amount of pills that you take by one (1) pill every seven (7) days. This is called a "slow downward taper" of your dose. For example: if you normally take four (4) pills per day, you will be asked to drop this dose to three (3) pills per day for seven (7) days, then to two (2) pills per day for seven (7) days, then to one (1) per day for seven (7) days, and at the end of those last seven (7) days, this is when the "Drug Holiday" would start.   Will I have withdrawals? By doing a "slow downward taper" like this one, it is unlikely that you will experience any significant withdrawal symptoms. Typically, what triggers withdrawals is the sudden stop of a high  dose opioid therapy. Withdrawals can usually be avoided by slowly decreasing the dose over a prolonged period of time. If you do not follow these instructions and decide to stop your medication abruptly, withdrawals may be possible.  What are withdrawals? Withdrawals: refers to the wide range of symptoms that occur after stopping or dramatically reducing opiate drugs after heavy and prolonged use. Withdrawal symptoms do not occur to patients that use low dose opioids, or those who take the medication sporadically. Contrary to benzodiazepine (example: Valium, Xanax, etc.) or alcohol withdrawals ("Delirium Tremens"), opioid withdrawals are not lethal. Withdrawals are the physical manifestation of the body getting rid of the excess receptors.  Expected Symptoms Early symptoms of withdrawal may include: . Agitation . Anxiety . Muscle aches . Increased tearing . Insomnia . Runny nose . Sweating . Yawning  Late symptoms of withdrawal may include: . Abdominal cramping . Diarrhea . Dilated pupils . Goose bumps . Nausea . Vomiting  Will I experience withdrawals? Due to the slow nature of the taper, it is very unlikely that you will experience any.  What is a slow taper? Taper: refers to the gradual decrease in dose.  (Last update: 03/30/2020) ____________________________________________________________________________________________

## 2020-08-10 NOTE — Progress Notes (Signed)
Patient: [name]  Service Category: E/M  Provider: Gaspar Cola, MD  DOB: 1955-12-26  DOS: 08/10/2020  Location: Office  MRN: [id number]  Setting: Ambulatory outpatient  Referring Provider: [name], NP  Type: Established Patient  Specialty: Interventional Pain Management  PCP: [name], NP  Location: Remote location  Delivery: TeleHealth     Virtual Encounter - Pain Management PROVIDER NOTE: Information contained herein reflects review and annotations entered in association with encounter. Interpretation of such information and data should be left to medically-trained personnel. Information provided to patient can be located elsewhere in the medical record under "Patient Instructions". Document created using STT-dictation technology, any transcriptional errors that may result from process are unintentional.    Contact & Pharmacy Preferred: [phone] Home: [phone] (home) Mobile: [phone] (mobile) E-mail: No e-mail address on record  [place], [place] - [address] [place] [address] Phone: [phone] Fax: [phone]   Pre-screening  [name] offered "in-person" vs "virtual" encounter. She indicated preferring virtual for this encounter.   Reason COVID-19*  Social distancing based on CDC and AMA recommendations.   I contacted [name] on 08/10/2020 via telephone.      I clearly identified myself as Gaspar Cola, MD. I verified that I was speaking with the correct person using two identifiers (Name: [name], and date of birth: [birth year]).  Consent I sought verbal advanced consent from [name] for virtual visit interactions. I informed [name] of possible security and privacy concerns, risks, and limitations associated with providing "not-in-person" medical evaluation and management services. I also informed [name] of the availability of "in-person" appointments.  Finally, I informed her that there would be a charge for the virtual visit and that she could be  personally, fully or partially, financially responsible for it. [name] expressed understanding and agreed to proceed.   Historic Elements   [name] is a 64 y.o. year old, female patient evaluated today after our last contact on 06/01/2020. [name]  has a past medical history of Acute encephalopathy (05/22/2018), Allergy, Anginal pain ([place]), Anxiety, Ascites, C. difficile colitis (07/10/2015), Cancer ([place]), Cirrhosis of liver not due to alcohol ([place]) (2016), Degenerative disk disease, Diverticulitis, Gastroparesis, GERD (gastroesophageal reflux disease), History of hiatal hernia, Hypertension, Hypothyroid, Hypothyroidism due to amiodarone, Ileus ([place]) (08/01/2015), Intussusception intestine ([place]) (05/2015), Orthostatic hypotension, PAF (paroxysmal atrial fibrillation) ([place]) (03/2015), Pancreatitis, Pneumonia (11/14/2015), Pneumonia (07/2020), Right ureteral stone (07/14/2016), Sepsis ([place]) (12/15/2017), Sick sinus syndrome ([place]), Stomach ulcer, Stroke ([place]), Syncope (01/2015), Syncope due to orthostatic hypotension (05/18/2015), Tachyarrhythmia (01/10/2016), TIA (transient ischemic attack) (02/2015), Type 1 diabetes ([place]), UTERINE CANCER, HX OF (03/27/2007), and UTI (urinary tract infection) (05/22/2018). She also  has a past surgical history that includes Hernia repair; Abdominal hysterectomy; Cholecystectomy; Esophagogastroduodenoscopy (N/A, 04/04/2015); Cardiac catheterization (N/A, 01/12/2016); Esophagogastroduodenoscopy (egd) with propofol (N/A, 01/18/2016); Flexible sigmoidoscopy (N/A, 01/18/2016); Cystoscopy/ureteroscopy/holmium laser (Right, 07/14/2016); Esophagogastroduodenoscopy (N/A, 12/28/2017); Cystoscopy with biopsy (N/A, 12/11/2019); Cystoscopy with fulgeration (N/A, 12/11/2019); Cystoscopy w/ retrogrades (Bilateral, 12/11/2019); Ureteroscopy (Right, 12/11/2019); Cystoscopy with stent placement (Right, 12/11/2019);  and Cystoscopy with urethral dilatation (Right, 12/11/2019). [name] has a current medication list which includes the following prescription(s): albuterol, atorvastatin, cholecalciferol, cyclobenzaprine, accu-chek aviva plus, levemir flextouch, magnesium gluconate, metoprolol tartrate, pantoprazole, sertraline, spacer/aero-hold chamber bags, nitroglycerin, [START ON 08/17/2020] oxycodone, [START ON 08/24/2020] oxycodone, [START ON 08/31/2020] oxycodone, [START ON 09/07/2020] oxycodone, and [START ON 09/14/2020] oxycodone. She  reports that  she has quit smoking. Her smoking use included cigarettes. She has never used smokeless tobacco. She reports that she does not drink alcohol and does not use drugs. [name] is allergic to aspirin, codeine sulfate, erythromycin, prednisone, rosiglitazone maleate, and tetanus-diphtheria toxoids td.   HPI  Today, she is being contacted for medication management.  The patient had an appointment to come in for medication management, but she rescheduled due to a virtual visit since she reports having been recently exposed to COVID-19.  There seems to be an interesting pattern emerging here.  On 04/06/2020 we did a UDS and he came back negative for oxycodone.  She was scheduled to return on 05/04/2020 for a face-to-face visit but she called and switch that visit to a virtual indicating that she was unable to make it because she was in court with a criminal case involving her son.  The patient's most recent UDS on 04/06/2020 came back negative for any oxycodone.  Her last medication refill before that had been on 03/10/2020 and it should have lasted until 04/09/2020, however by 04/06/2020 she had no medicine in her system.  Her prior urine drug screen test(02/04/2020)did not have any medication on it either. On 04/06/2020 I questioned the patient about this and she indicated that it was due to her being off of the medicine, according to her, on my request, for the purpose of doing a  drug holiday. However, according to the patient PMP on 5/1/2021she filledone of my prescriptions for 180 tablets, which should have lasted until 02/08/2020. On 5/31/2021she had the next prescription filled. This means that she obviously did not do a "Drug Holiday" since she would have had medicine left to last another two weeks, if she in fact had done a "Drug Holiday", as mandated. However, she was not really telling me the truth since she obviously had no medicine left and this is why she picked up the next prescription on 02/08/20.  Since there was no oxycodone on her UDS on 04/06/2020 we had ordered a repeat test. Interestingly, she indicates that there would be no medication in it since she had been throwing up for the past 3 days. Initially she said that she threw up her medication that day, but now she then changed the story saying that he had been for the past 3 days. Interestingly, she was not having any withdrawal symptoms. Furthermore, she asked me if she would be able to get her medicine early. There is a question as to whenever the patient has taken more medication than prescribed and had ran out of medicine, she is binging, or there is medication diversion. None of these alternatives are good.It is also interesting to note is that she did not bring her medications to the 05/04/20 appointment for a pill count. This again has increased my level suspicion regarding the possibility that she may not be compliant with regards to how she takes the medication.On 05/04/2020 she was scheduled to return to clinics for a face-to-face medication management appointment.  On 06/07/2020 the patient ended up on a virtual visit with Dr. Vashti Hey who switched her back from the [place] that I have put her on to that oxycodone.  At that time she was scheduled to come back to see me for a face-to-face medication management visit on 06/29/2020.  On the day of the appointment she called and changed the  face-to-face visit to a virtual visit secondary to "a recent exposure to COVID-19".  When I called  her to speak with her at 9:37 AM she seemed to be extremely sleepy, possibly oversedated.  At the time she was given a 30-day prescription and instructed to come in for a face-to-face visit for medication management, pill count, and to repeat the UDS.  Today 08/10/20, she called and again switched the face-to-face visit to a virtual visit again forwarding "a recent exposure to COVID-19".  At this point, it is fairly obvious that she is manipulating the system and therefore today will begin tapering the patient off of the opioids.  Today I spoke to her about all this issues and I informed her that I would be tapering her off of the opioids.  She was okay to do this, but I am thinking that she is assuming this is just a "Drug Holiday".  I will see her again after she has been at least 14 days off of the opioids and I will reassess whether or not we in fact need to have her on any long-term opioid analgesics.  Pharmacotherapy Assessment  Analgesic: Oxycodone IR 5 mg, 1 tab PO q 4 hrs (30 mg/day of oxycodone). VERY High Risk. (See 06/29/2020 note) MAX. MME: 200 mg/day MME/day: 45 mg/day.   Monitoring:  PMP: PDMP reviewed during this encounter.       Pharmacotherapy: No side-effects or adverse reactions reported. Compliance: No problems identified. Effectiveness: Clinically acceptable. Plan: Refer to "POC".  UDS:  Summary  Date Value Ref Range Status  04/06/2020 Note  Final    Comment:    ==================================================================== [employer] Select 13 (MW) ==================================================================== Test                             Result       Flag       Units  Drug Absent but Declared for Prescription Verification   Oxycodone                      Not Detected UNEXPECTED ng/mg  creat ==================================================================== Test                      Result    Flag   Units      Ref Range   Creatinine              39               mg/dL      >=20 ==================================================================== Declared Medications:  The flagging and interpretation on this report are based on the  following declared medications.  Unexpected results may arise from  inaccuracies in the declared medications.   **Note: The testing scope of this panel includes these medications:   Oxycodone (Roxicodone)   **Note: The testing scope of this panel does not include the  following reported medications:   Albuterol (Ventolin HFA)  Atorvastatin (Lipitor)  Cephalexin (Keflex)  Cyclobenzaprine (Flexeril)  Insulin (Levemir)  Magnesium (Magonate)  Nitroglycerin (Nitrostat)  Sertraline (Zoloft)  Vitamin D3 ==================================================================== For clinical consultation, please call [phone]. ====================================================================     Laboratory Chemistry Profile   Renal Lab Results  Component Value Date   BUN 17 07/18/2020   CREATININE 1.61 (H) 07/18/2020   GFR 42.53 (L) 08/18/2019   GFRAA 49 (L) 02/20/2020   GFRNONAA 35 (L) 07/18/2020     Hepatic Lab Results  Component Value Date   AST 20 07/18/2020   ALT 14 07/18/2020   ALBUMIN 2.5 (L)  07/18/2020   ALKPHOS 84 07/18/2020   HCVAB 0.2 09/25/2018   AMYLASE 28 08/01/2015   LIPASE 20 12/05/2019   AMMONIA 46 (H) 08/13/2019     Electrolytes Lab Results  Component Value Date   NA 133 (L) 07/18/2020   K 3.6 07/18/2020   CL 105 07/18/2020   CALCIUM 7.9 (L) 07/18/2020   MG 1.6 (L) 07/18/2020   PHOS 2.0 (L) 07/18/2020     Bone Lab Results  Component Value Date   VD25OH 4.7 (L) 11/25/2018     Inflammation (CRP: Acute Phase) (ESR: Chronic Phase) Lab Results  Component Value Date   CRP 14.8 (H)  07/18/2020   ESRSEDRATE 79 (H) 11/25/2018   LATICACIDVEN 1.2 01/23/2020       Note: Above Lab results reviewed.  Imaging  ECHOCARDIOGRAM COMPLETE    ECHOCARDIOGRAM REPORT       Patient Name:   AYAKA ANDES Date of Exam: 07/18/2020 Medical Rec #:  [id number]       Height:       63.0 in Accession #:    [id number]      Weight:       135.0 lb Date of Birth:  Jun 13, 1956       BSA:          1.636 m Patient Age:    86 years        BP:           99/65 mmHg Patient Gender: F               HR:           90 bpm. Exam Location:  [facility]  Procedure: 2D Echo, Cardiac Doppler and Color Doppler  Indications:     Elevated brain natriurectic peptide   History:         Patient has prior history of Echocardiogram examinations, most                  recent 12/29/2017. TIA, Signs/Symptoms:Syncope; Risk                  Factors:Hypertension.   Sonographer:     Sherrie Sport RDCS (AE) Referring Phys:  [id number] Alben Deeds JR Diagnosing Phys: Kathlyn Sacramento MD    Sonographer Comments: No apical window and no subcostal window. IMPRESSIONS   1. Left ventricular ejection fraction, by estimation, is 60 to 65%. The left ventricle has normal function. The left ventricle has no regional wall motion abnormalities. There is moderate left ventricular hypertrophy. Left ventricular diastolic function  could not be evaluated.  2. Right ventricular systolic function is normal. The right ventricular size is normal. Tricuspid regurgitation signal is inadequate for assessing PA pressure.  3. The mitral valve is normal in structure. No evidence of mitral valve regurgitation. No evidence of mitral stenosis.  4. The aortic valve is normal in structure. Aortic valve regurgitation is not visualized. Mild aortic valve sclerosis is present, with no evidence of aortic valve stenosis.  5. No apical window and no subcostal window. Limited images.  FINDINGS  Left Ventricle: Left ventricular ejection fraction, by estimation,  is 60 to 65%. The left ventricle has normal function. The left ventricle has no regional wall motion abnormalities. The left ventricular internal cavity size was normal in size. There is  moderate left ventricular hypertrophy. Left ventricular diastolic function could not be evaluated.  Right Ventricle: The right ventricular size is normal. No increase in right ventricular wall thickness. Right ventricular  systolic function is normal. Tricuspid regurgitation signal is inadequate for assessing PA pressure.  Left Atrium: Left atrial size was normal in size.  Right Atrium: Right atrial size was normal in size.  Pericardium: There is no evidence of pericardial effusion.  Mitral Valve: The mitral valve is normal in structure. No evidence of mitral valve regurgitation. No evidence of mitral valve stenosis.  Tricuspid Valve: The tricuspid valve is normal in structure. Tricuspid valve regurgitation is not demonstrated. No evidence of tricuspid stenosis.  Aortic Valve: The aortic valve is normal in structure. Aortic valve regurgitation is not visualized. Mild aortic valve sclerosis is present, with no evidence of aortic valve stenosis.  Pulmonic Valve: The pulmonic valve was normal in structure. Pulmonic valve regurgitation is not visualized. No evidence of pulmonic stenosis.  Aorta: The aortic root is normal in size and structure.  Venous: The inferior vena cava was not well visualized.  IAS/Shunts: No atrial level shunt detected by color flow Doppler.    LEFT VENTRICLE PLAX 2D LVIDd:         4.25 cm LVIDs:         2.71 cm LV PW:         1.03 cm LV IVS:        1.48 cm LVOT diam:     2.20 cm LVOT Area:     3.80 cm    LEFT ATRIUM         Index LA diam:    3.90 cm 2.38 cm/m                        PULMONIC VALVE AORTA                 PV Vmax:        0.94 m/s Ao Root diam: 3.10 cm PV Peak grad:   3.6 mmHg                       RVOT Peak grad: 3 mmHg      SHUNTS Systemic Diam: 2.20  cm  Kathlyn Sacramento MD Electronically signed by Kathlyn Sacramento MD Signature Date/Time: 07/18/2020/2:27:31 PM      Final   DG Chest Port 1 View CLINICAL DATA:  Dry cough.  EXAM: PORTABLE CHEST 1 VIEW  COMPARISON:  PA and lateral chest 07/16/2020 and 07/01/2020. CT chest 07/17/2020.  FINDINGS: There is left basilar airspace disease. The right lung is clear. Heart size is normal. No pneumothorax or pleural effusion.  IMPRESSION: Left basilar airspace disease which could be due to atelectasis or pneumonia.  Electronically Signed   By: Inge Rise M.D.   On: 07/18/2020 10:59  Assessment  The primary encounter diagnosis was Chronic pain syndrome. Diagnoses of Chronic low back pain (Primary area of Pain) (Bilateral) (L>R) w/ sciatica (Bilateral), Chronic lower extremity pain (Secondary Area of Pain) (Bilateral) (L>R), Fibromyalgia syndrome, Lumbar facet syndrome (Bilateral) (R>L), and Pharmacologic therapy were also pertinent to this visit.  Plan of Care  Problem-specific:  No problem-specific Assessment & Plan notes found for this encounter.  [name] has a current medication list which includes the following long-term medication(s): albuterol, cyclobenzaprine, levemir flextouch, nitroglycerin, [START ON 08/17/2020] oxycodone, [START ON 08/24/2020] oxycodone, [START ON 08/31/2020] oxycodone, [START ON 09/07/2020] oxycodone, [START ON 09/14/2020] oxycodone, pantoprazole, and sertraline.  Pharmacotherapy (Medications Ordered): Meds ordered this encounter  Medications  . oxyCODONE (OXY IR/ROXICODONE) 5 MG immediate release tablet    Sig:  Take 1 tablet (5 mg total) by mouth 5 (five) times daily for 7 days. Max: 5/day. Must last 7 days.    Dispense:  35 tablet    Refill:  0    Downward opioid taper. To avoid withdrawal, fill prescriptions in the exact order as they were intended. Do not fill until: 08/17/2020. To last until: 08/24/2020  . oxyCODONE (OXY IR/ROXICODONE) 5  MG immediate release tablet    Sig: Take 1 tablet (5 mg total) by mouth 4 (four) times daily for 7 days. Max: 4/day. Must last 7 days.    Dispense:  28 tablet    Refill:  0    Downward opioid taper. To avoid withdrawal, fill prescriptions in the exact order as they were intended. Do not fill until: 08/24/2020. To last until: 08/31/2020  . oxyCODONE (OXY IR/ROXICODONE) 5 MG immediate release tablet    Sig: Take 1 tablet (5 mg total) by mouth 3 (three) times daily for 7 days. Max: 3/day. Must last 7 days.    Dispense:  21 tablet    Refill:  0    Downward opioid taper. To avoid withdrawal, fill prescriptions in the exact order as they were intended. Do not fill until: 08/31/2020. To last until: 09/07/2020  . oxyCODONE (OXY IR/ROXICODONE) 5 MG immediate release tablet    Sig: Take 1 tablet (5 mg total) by mouth 2 (two) times daily for 7 days. Max: 2/day. Must last 7 days.    Dispense:  14 tablet    Refill:  0    Downward opioid taper. To avoid withdrawal, fill prescriptions in the exact order as they were intended. Do not fill until: 09/07/2020. To last until: 09/14/2020  . oxyCODONE (OXY IR/ROXICODONE) 5 MG immediate release tablet    Sig: Take 1 tablet (5 mg total) by mouth daily for 7 days. Max: 1/day. Must last 7 days.    Dispense:  7 tablet    Refill:  0    Downward opioid taper. To avoid withdrawal, fill prescriptions in the exact order as they were intended. Do not fill until: 09/14/2020. To last until: 09/21/2020   Orders:  No orders of the defined types were placed in this encounter.  Follow-up plan:   Return in about 2 months (around 10/10/2020) for (F2F), (Med Mgmt) for eval after drug holiday.      Interventional management options:  Considering:   NOTE: Eliquis ANTICOAGULATION (Stop: 3 days  Restart: 6 hrs)  Diagnostic bilateral celiac plexus block  Diagnostic bilateral lumbar facet block  Possible bilateral lumbar facet RFA  Diagnostic bilateral cervical facet block  Possible  bilateral cervical facet RFA    PRN Procedures:   None at this time       Recent Visits Date Type Provider Dept  06/29/20 Telemedicine Milinda Pointer, [facility]  06/07/20 Office Visit Molli Barrows, MD [facility]  Showing recent visits within past 90 days and meeting all other requirements Today's Visits Date Type Provider Dept  08/10/20 Telemedicine Milinda Pointer, MD [facility]  Showing today's visits and meeting all other requirements Future Appointments No visits were found meeting these conditions. Showing future appointments within next 90 days and meeting all other requirements  I discussed the assessment and treatment plan with the patient. The patient was provided an opportunity to ask questions and all were answered. The patient agreed with the plan and demonstrated an understanding of the instructions.  Patient advised to call back or seek an  in-person evaluation if the symptoms or condition worsens.  Duration of encounter: 15 minutes.  Note by: Gaspar Cola, MD Date: 08/10/2020; Time: 4:25 PM

## 2020-08-17 ENCOUNTER — Other Ambulatory Visit: Payer: Self-pay | Admitting: Internal Medicine

## 2020-08-17 DIAGNOSIS — E1165 Type 2 diabetes mellitus with hyperglycemia: Secondary | ICD-10-CM

## 2020-08-18 IMAGING — DX DG ABDOMEN 1V
1 series · 1 of 1 positions shown · non-contrast
Comparison: 08/18/2018

CLINICAL DATA: Abdominal pain and distention

EXAM:
ABDOMEN - 1 VIEW

[abdomen kub]
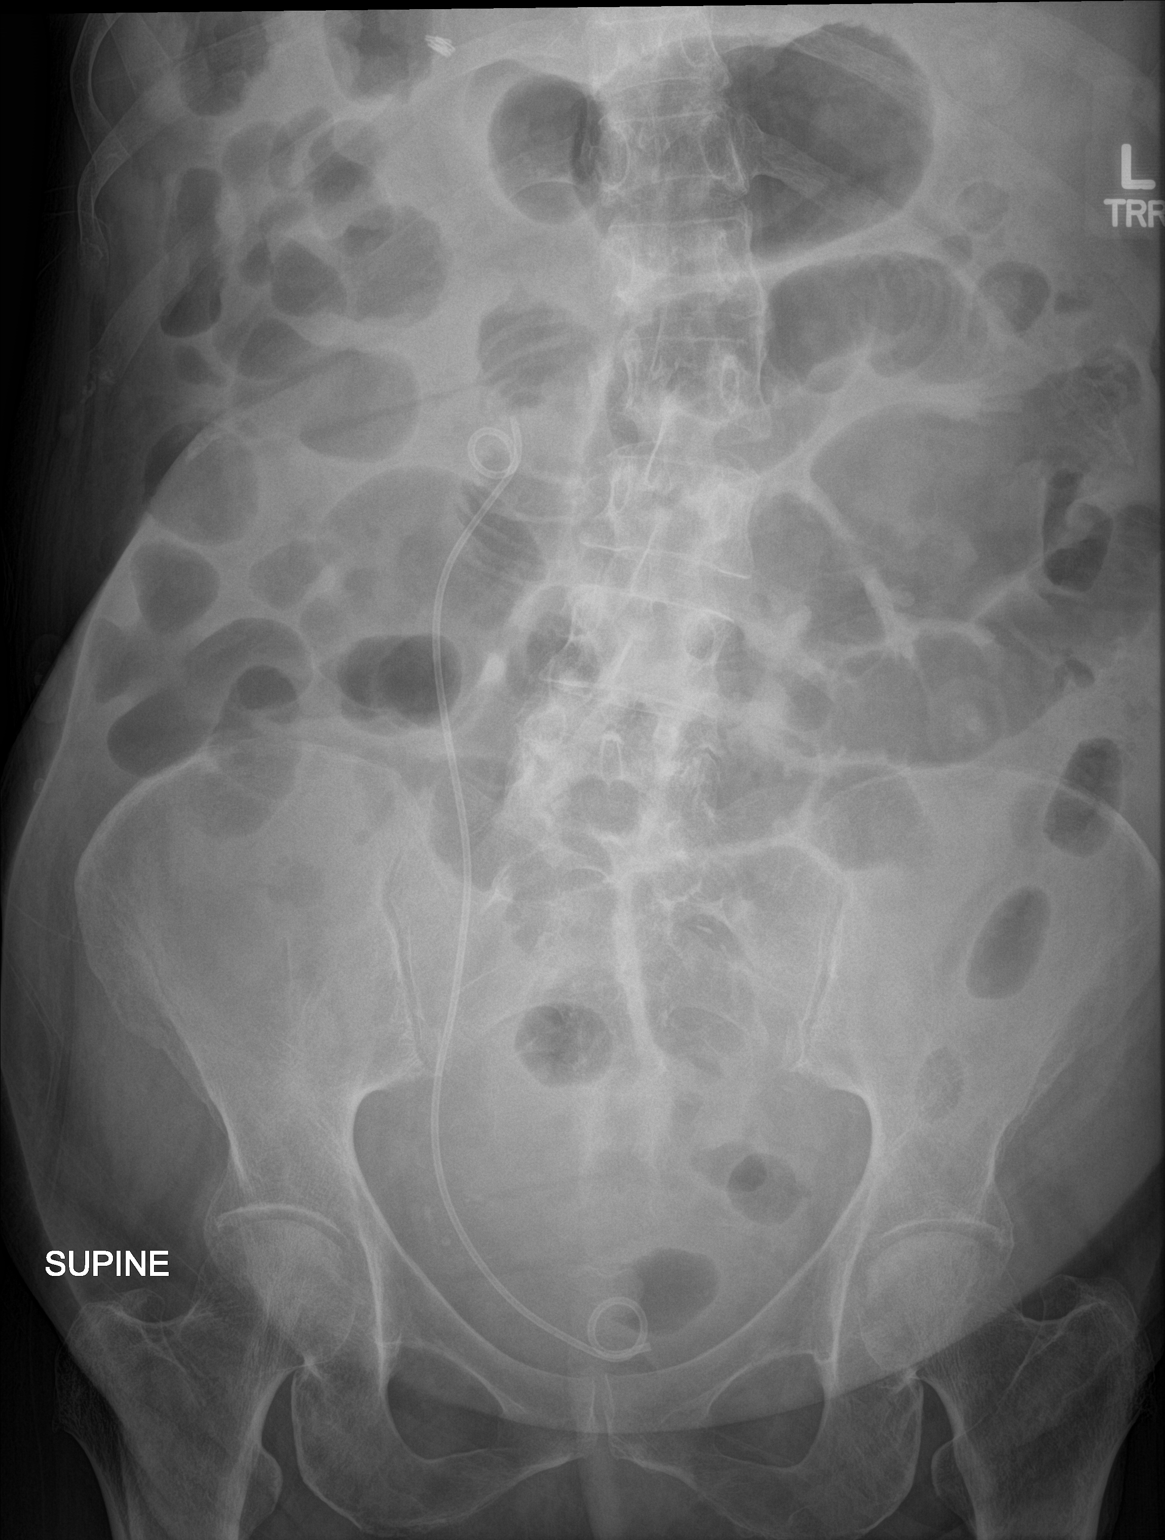

[1 of 1 positions shown; findings below may reference images not displayed]

FINDINGS: There are a few borderline dilated small bowel loops, but gas is
also seen throughout the colon. A similar appearance was present on
comparison CT scanogram. Right-sided internal ureteral stent in good
position. Cholecystectomy clips.
IMPRESSION: 1. There are a few mildly dilated small bowel loops, but overall
nonobstructive pattern similar to prior study.
2. Ureteral stent on the right that is in good position.

## 2020-08-18 IMAGING — CT CT ABD-PELV W/ CM
2 of 5 series · 15 of 46 positions shown, 17 images · IV contrast (APPLIED)
Comparison: 08/18/2018

CLINICAL DATA: Abdominal pain

EXAM:
CT ABDOMEN AND PELVIS WITH CONTRAST
TECHNIQUE: Multidetector CT imaging of the abdomen and pelvis was performed
using the standard protocol following bolus administration of
intravenous contrast.
CONTRAST:  100mL OMNIPAQUE IOHEXOL 300 MG/ML  SOLN

[Series 2: routine abd/pel with · axial · 0.72mm/px · z∈[-774,-329]mm · 12 of 101 slices shown, 14 images]
[im 6/101  soft-tissue]
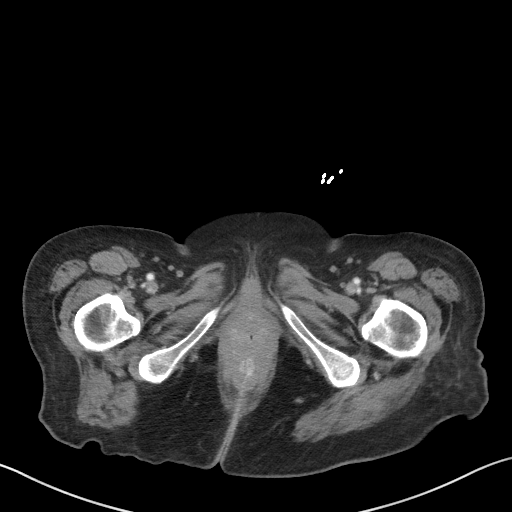
[im 6/101  bone]
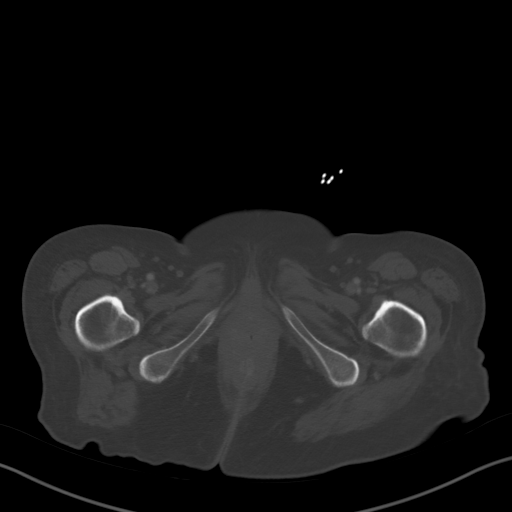
[im 16/101  soft-tissue]
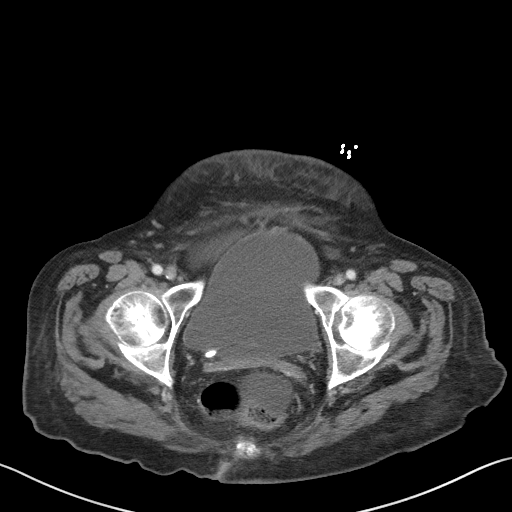
[im 22/101  soft-tissue]
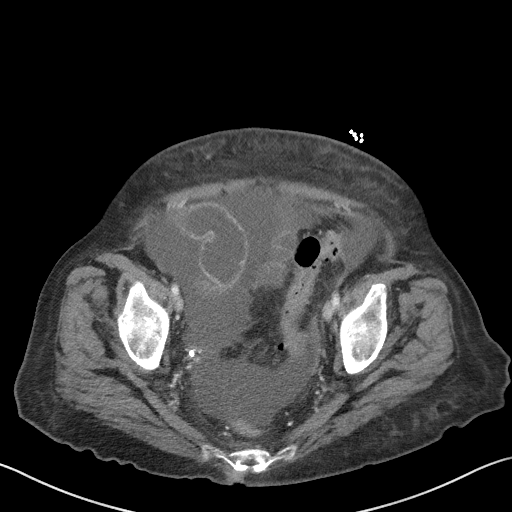
[im 32/101  soft-tissue]
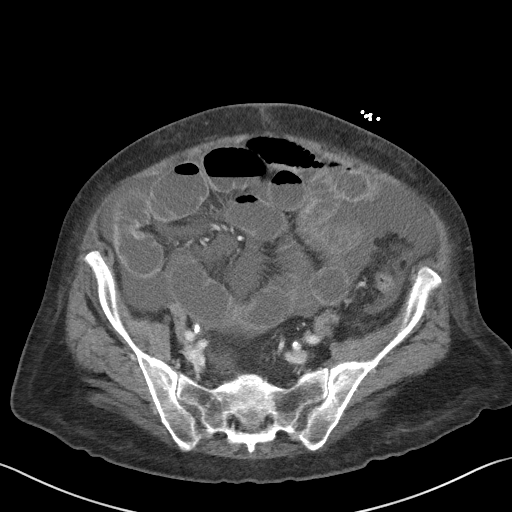
[im 37/101  soft-tissue]
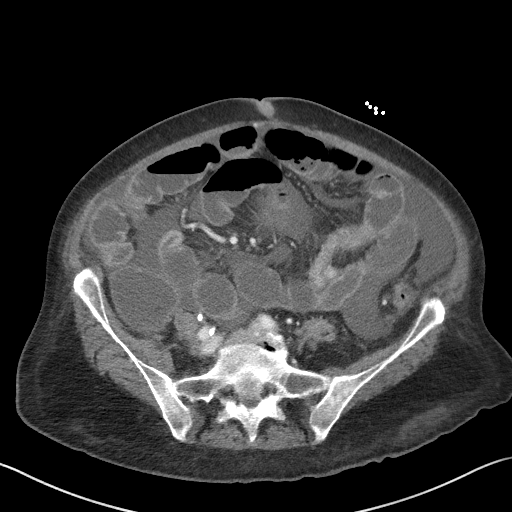
[im 48/101  soft-tissue]
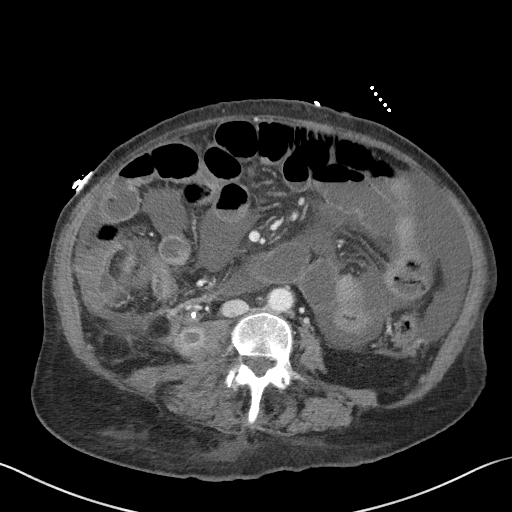
[im 53/101  soft-tissue]
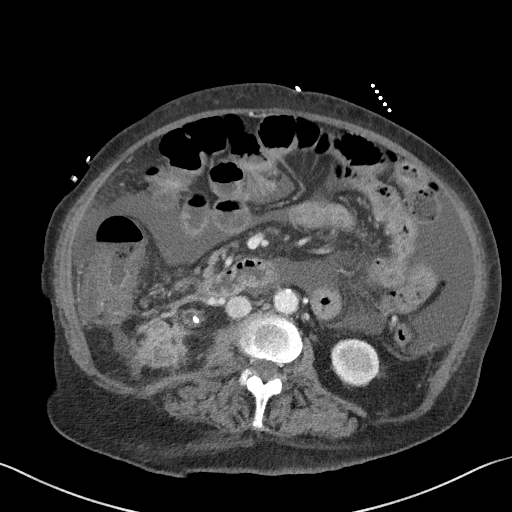
[im 64/101  soft-tissue]
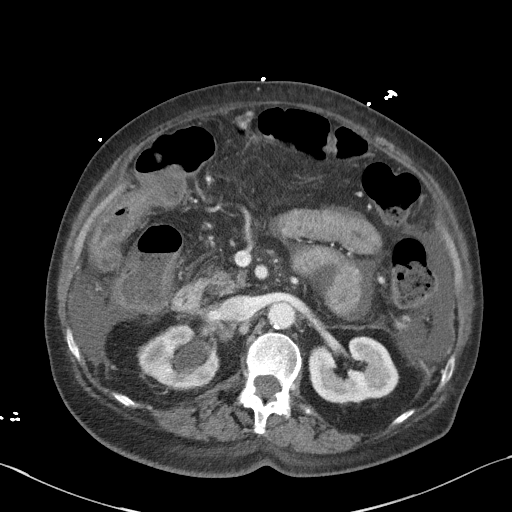
[im 69/101  soft-tissue]
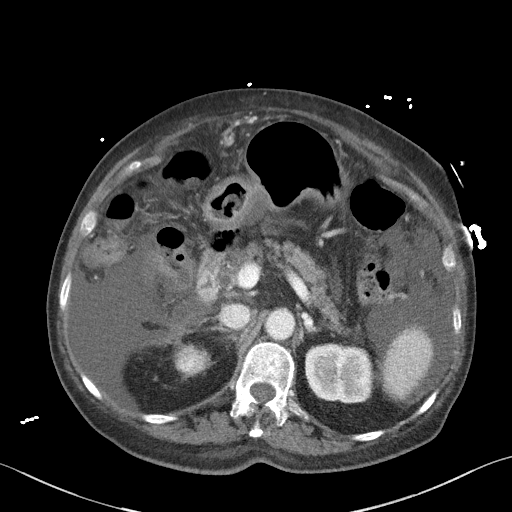
[im 69/101  bone]
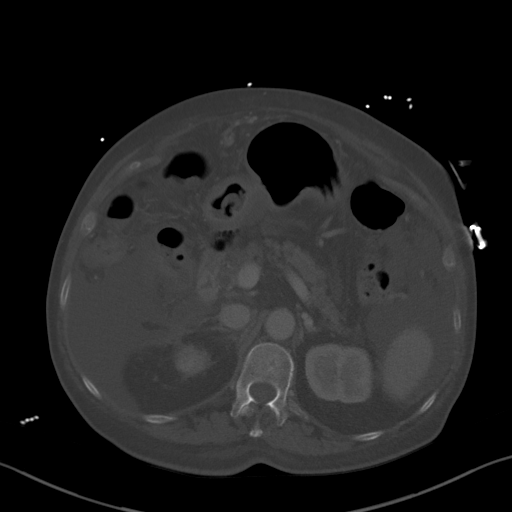
[im 79/101  soft-tissue]
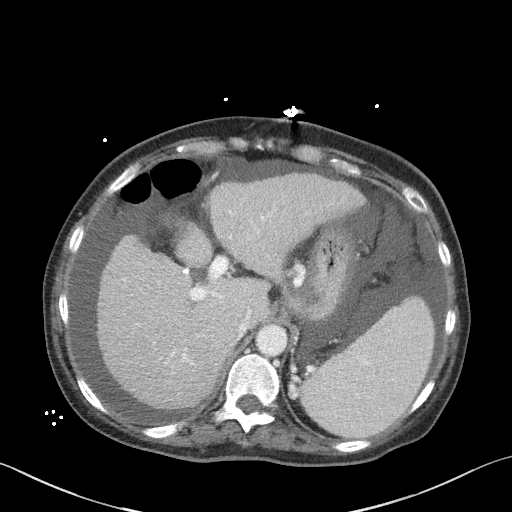
[im 85/101  soft-tissue]
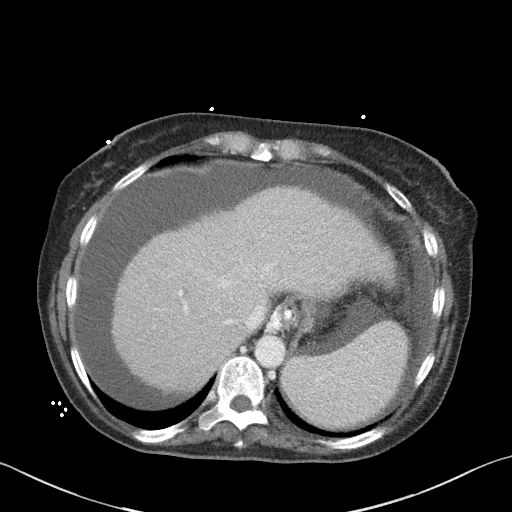
[im 95/101  soft-tissue]
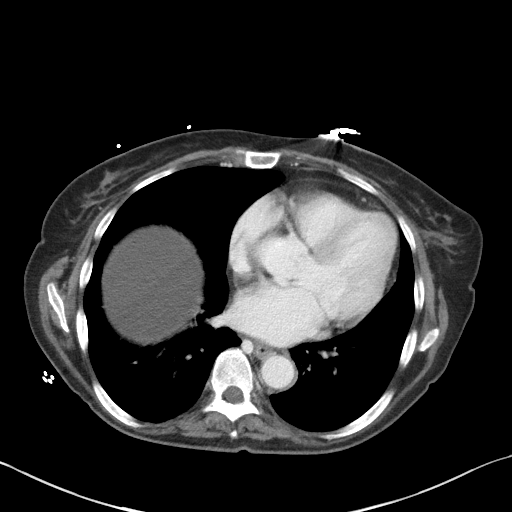

[Series 5: coronal st · coronal · 0.74mm/px · 3 of 93 slices shown]
[im 31/93  soft-tissue]
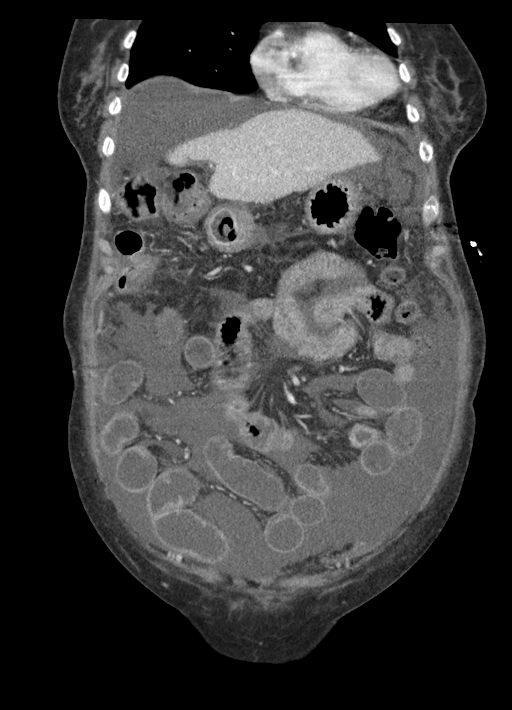
[im 41/93  soft-tissue]
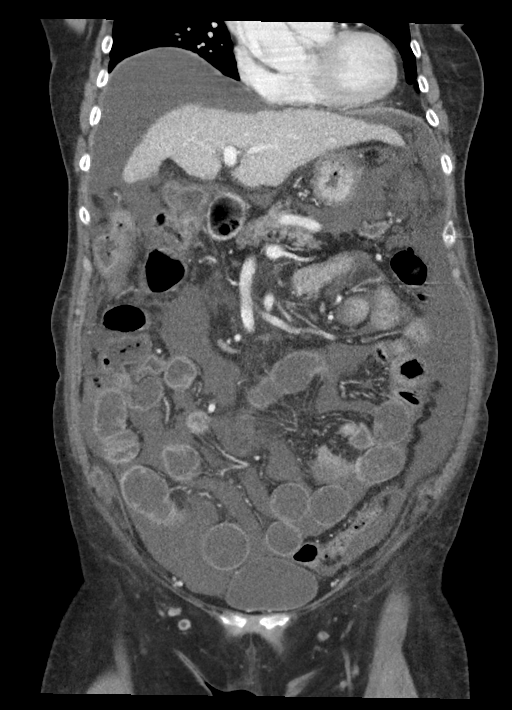
[im 52/93  soft-tissue]
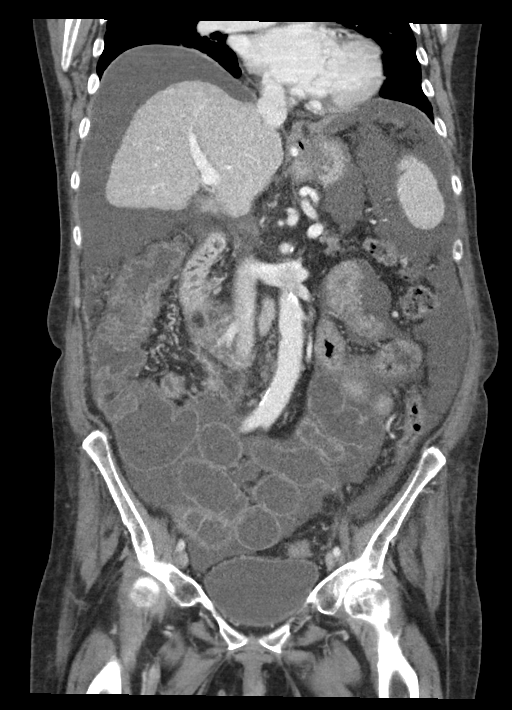

[15 of 46 positions shown; findings below may reference images not displayed]

FINDINGS: Lower chest:  Borderline heart size.  No acute finding

Hepatobiliary: Cirrhotic liver morphology with lower periesophageal
varices and moderate free flowing ascites.Cholecystectomy with
normal common bile duct diameter

Pancreas: Unremarkable.

Spleen: Enlarged in the setting of portal hypertension

Adrenals/Urinary Tract: Negative adrenals. Right ureteral stent with
mild right hydronephrosis and mild upper right urothelial
thickening. There is enhancing centrally low-density masslike
appearance in the right inferior perinephric space measuring up to
3.6 cm and continuing into the right psoas where low-density
collection measures 15 mm. This mass is in close proximity to the
mid duodenum and the ascending colon, without definite fistula.

Stomach/Bowel: Esophageal varices as noted above. There is mild
thickening of the ascending colon which may be from portal
hypertension. Diffuse fluid filled small-bowel loops without noted
obstruction point. No appendicitis.

Vascular/Lymphatic: No acute vascular abnormality. Retroperitoneal
findings described above. No worrisome nodal enlargement

Reproductive:Hysterectomy

Other: Moderate ascites.  No pneumoperitoneum

Musculoskeletal: No acute finding.  Thoracolumbar levoscoliosis.
IMPRESSION: 1. Irregular abscess or necrotic mass in the right perinephric space
extending into the right psoas-extent similar to 08/18/2018 CT.
There is chronic right renal stenting and this is likely chronic
abscess in this diabetic. There is also distortion and continuity of
the descending duodenum (which was markedly inflamed on a 9671 CT),
but no clear fistula. Tumor is not favored but sampling should be
considered.
2. Cirrhosis with moderate ascites and esophageal varices.
3. Fluid-filled bowel without transition point.
4. Right ureteral stent is in good position. There is mild right
hydronephrosis that is improved from 08/18/2018.

## 2020-08-18 IMAGING — US US PARACENTESIS
1 series · 5 of 5 positions shown · non-contrast
Comparison: none

INDICATION: Ascites and history of cirrhosis.

[Series 1: us paracentesis · 5 of 5 slices shown]
[im 1/5]
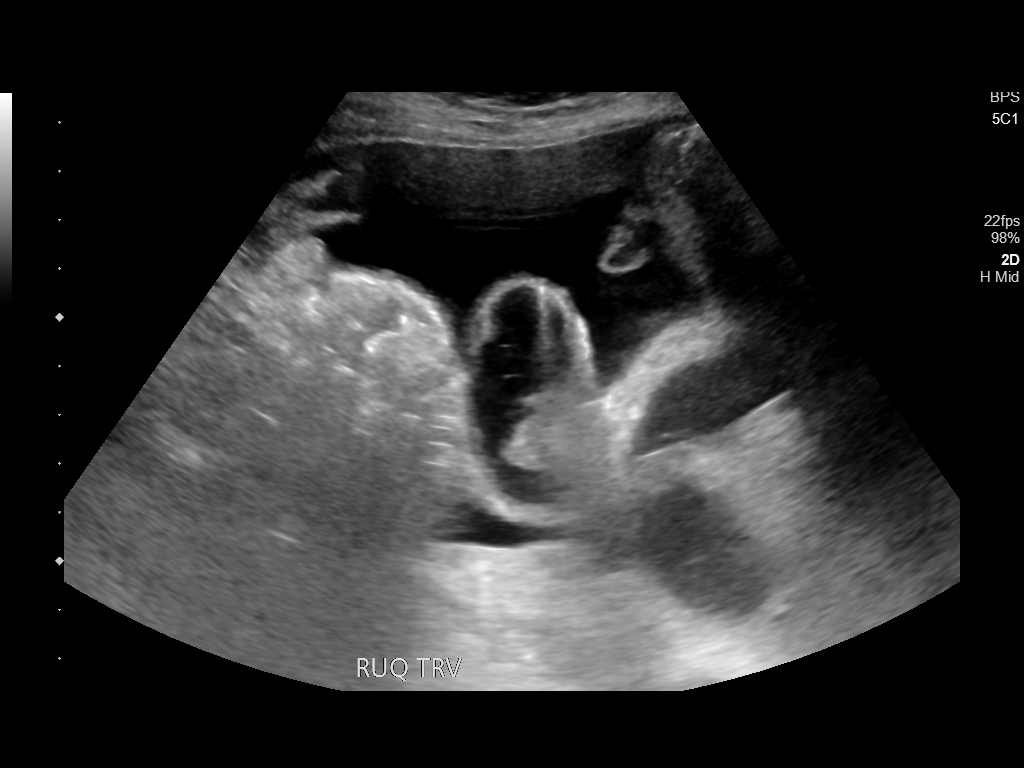
[im 2/5]
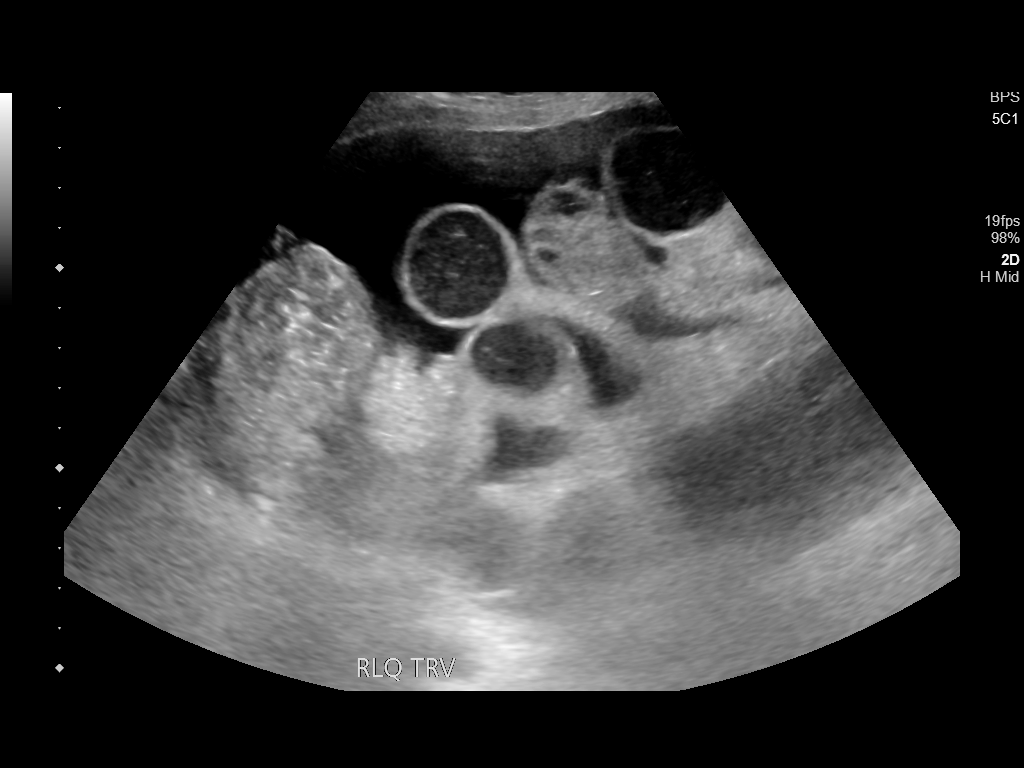
[im 3/5]
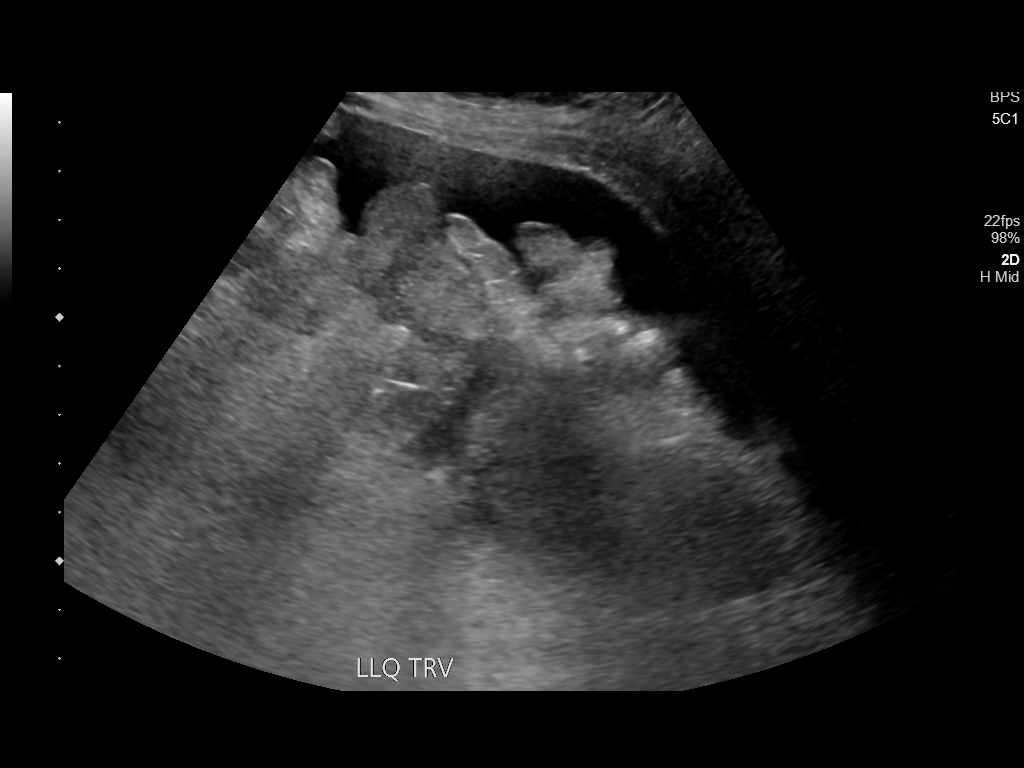
[im 4/5]
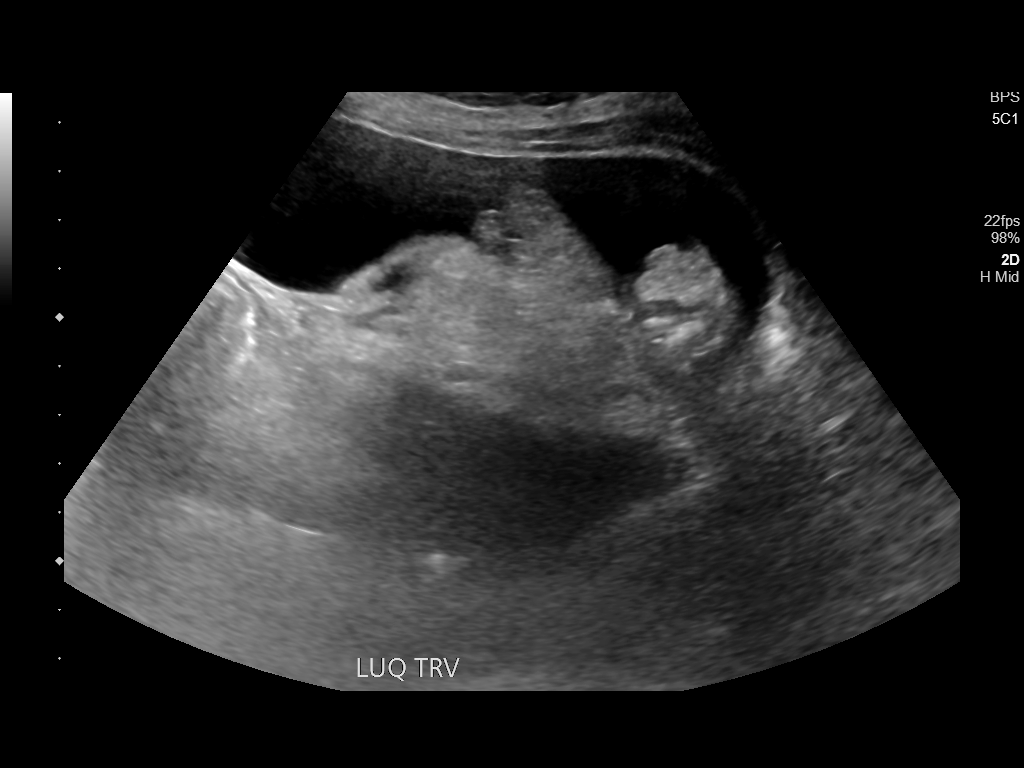
[im 5/5]
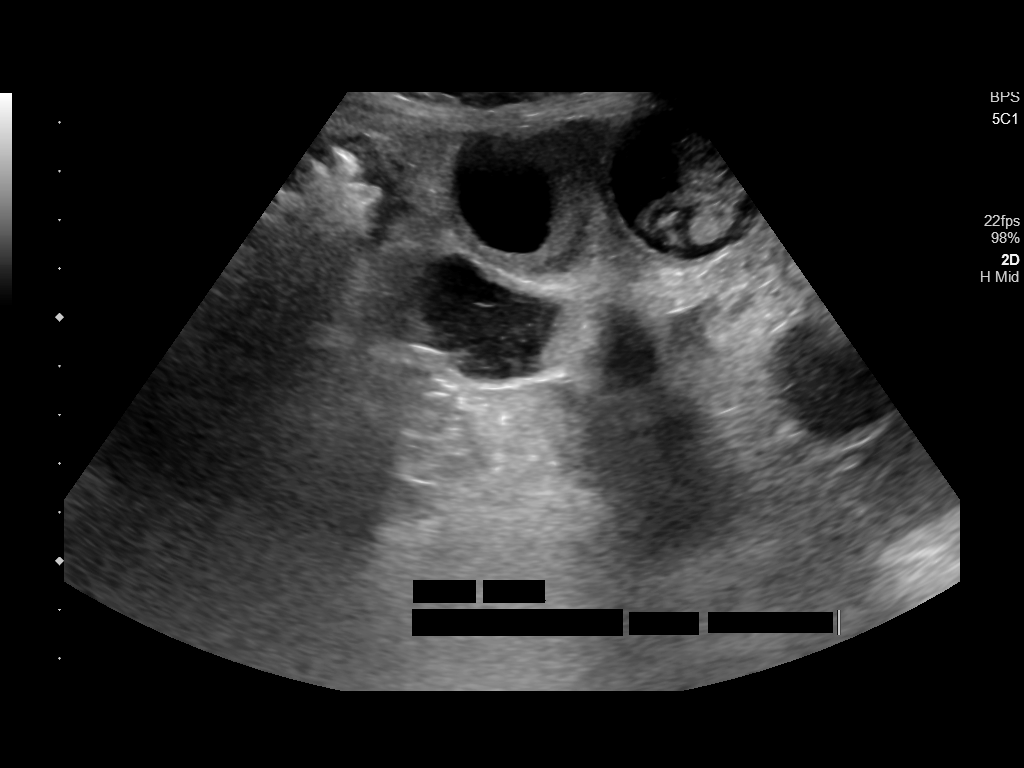

[5 of 5 positions shown; findings below may reference images not displayed]

EXAM:
ULTRASOUND GUIDED PARACENTESIS

MEDICATIONS:
None.

COMPLICATIONS:
None immediate.

PROCEDURE:
Informed written consent was obtained from the patient after a
discussion of the risks, benefits and alternatives to treatment. A
timeout was performed prior to the initiation of the procedure.

Initial ultrasound was performed to localize ascites. The right
lower abdomen was prepped and draped in the usual sterile fashion.
1% lidocaine with epinephrine was used for local anesthesia.

Following this, a 6 Fr Safe-T-Centesis catheter was introduced. An
ultrasound image was saved for documentation purposes. The
paracentesis was performed. The catheter was removed and a dressing
was applied. The patient tolerated the procedure well without
immediate post procedural complication.
FINDINGS: A total of approximately 2.1 L of clear, yellow fluid was removed.
IMPRESSION: Successful ultrasound-guided paracentesis yielding 2.1 liters of
peritoneal fluid.

## 2020-08-19 ENCOUNTER — Ambulatory Visit: Payer: Medicare HMO | Admitting: Primary Care

## 2020-08-25 ENCOUNTER — Ambulatory Visit: Payer: Medicare HMO | Admitting: Primary Care

## 2020-08-27 ENCOUNTER — Emergency Department: Payer: Medicare HMO

## 2020-08-27 ENCOUNTER — Other Ambulatory Visit: Payer: Self-pay

## 2020-08-27 ENCOUNTER — Encounter: Payer: Self-pay | Admitting: Emergency Medicine

## 2020-08-27 ENCOUNTER — Inpatient Hospital Stay
Admission: EM | Admit: 2020-08-27 | Discharge: 2020-08-31 | DRG: 312 | Disposition: A | Discharge status: Home Health | Payer: Medicare HMO | Attending: Internal Medicine | Admitting: Internal Medicine

## 2020-08-27 DIAGNOSIS — S0990XA Unspecified injury of head, initial encounter: Secondary | ICD-10-CM | POA: Diagnosis present

## 2020-08-27 DIAGNOSIS — M5136 Other intervertebral disc degeneration, lumbar region: Secondary | ICD-10-CM | POA: Diagnosis present

## 2020-08-27 DIAGNOSIS — E162 Hypoglycemia, unspecified: Secondary | ICD-10-CM | POA: Diagnosis present

## 2020-08-27 DIAGNOSIS — Y92019 Unspecified place in single-family (private) house as the place of occurrence of the external cause: Secondary | ICD-10-CM

## 2020-08-27 DIAGNOSIS — N1831 Chronic kidney disease, stage 3a: Secondary | ICD-10-CM | POA: Diagnosis present

## 2020-08-27 DIAGNOSIS — R296 Repeated falls: Secondary | ICD-10-CM | POA: Diagnosis present

## 2020-08-27 DIAGNOSIS — R9431 Abnormal electrocardiogram [ECG] [EKG]: Secondary | ICD-10-CM | POA: Diagnosis present

## 2020-08-27 DIAGNOSIS — E785 Hyperlipidemia, unspecified: Secondary | ICD-10-CM | POA: Diagnosis present

## 2020-08-27 DIAGNOSIS — Z888 Allergy status to other drugs, medicaments and biological substances status: Secondary | ICD-10-CM

## 2020-08-27 DIAGNOSIS — I951 Orthostatic hypotension: Secondary | ICD-10-CM | POA: Diagnosis not present

## 2020-08-27 DIAGNOSIS — E1149 Type 2 diabetes mellitus with other diabetic neurological complication: Secondary | ICD-10-CM | POA: Diagnosis present

## 2020-08-27 DIAGNOSIS — I480 Paroxysmal atrial fibrillation: Secondary | ICD-10-CM | POA: Diagnosis present

## 2020-08-27 DIAGNOSIS — W1830XA Fall on same level, unspecified, initial encounter: Secondary | ICD-10-CM | POA: Diagnosis present

## 2020-08-27 DIAGNOSIS — I959 Hypotension, unspecified: Secondary | ICD-10-CM | POA: Diagnosis not present

## 2020-08-27 DIAGNOSIS — K3184 Gastroparesis: Secondary | ICD-10-CM | POA: Diagnosis present

## 2020-08-27 DIAGNOSIS — E876 Hypokalemia: Secondary | ICD-10-CM | POA: Diagnosis present

## 2020-08-27 DIAGNOSIS — N390 Urinary tract infection, site not specified: Secondary | ICD-10-CM | POA: Diagnosis present

## 2020-08-27 DIAGNOSIS — Z886 Allergy status to analgesic agent status: Secondary | ICD-10-CM

## 2020-08-27 DIAGNOSIS — I129 Hypertensive chronic kidney disease with stage 1 through stage 4 chronic kidney disease, or unspecified chronic kidney disease: Secondary | ICD-10-CM | POA: Diagnosis present

## 2020-08-27 DIAGNOSIS — Z887 Allergy status to serum and vaccine status: Secondary | ICD-10-CM

## 2020-08-27 DIAGNOSIS — Z881 Allergy status to other antibiotic agents status: Secondary | ICD-10-CM

## 2020-08-27 DIAGNOSIS — N183 Chronic kidney disease, stage 3 unspecified: Secondary | ICD-10-CM | POA: Diagnosis present

## 2020-08-27 DIAGNOSIS — E1122 Type 2 diabetes mellitus with diabetic chronic kidney disease: Secondary | ICD-10-CM | POA: Diagnosis present

## 2020-08-27 DIAGNOSIS — G894 Chronic pain syndrome: Secondary | ICD-10-CM | POA: Diagnosis present

## 2020-08-27 DIAGNOSIS — K449 Diaphragmatic hernia without obstruction or gangrene: Secondary | ICD-10-CM | POA: Diagnosis present

## 2020-08-27 DIAGNOSIS — Z8542 Personal history of malignant neoplasm of other parts of uterus: Secondary | ICD-10-CM

## 2020-08-27 DIAGNOSIS — M797 Fibromyalgia: Secondary | ICD-10-CM | POA: Diagnosis present

## 2020-08-27 DIAGNOSIS — W19XXXA Unspecified fall, initial encounter: Secondary | ICD-10-CM

## 2020-08-27 DIAGNOSIS — I10 Essential (primary) hypertension: Secondary | ICD-10-CM | POA: Diagnosis present

## 2020-08-27 DIAGNOSIS — Z79899 Other long term (current) drug therapy: Secondary | ICD-10-CM

## 2020-08-27 DIAGNOSIS — K7460 Unspecified cirrhosis of liver: Secondary | ICD-10-CM | POA: Diagnosis present

## 2020-08-27 DIAGNOSIS — Z043 Encounter for examination and observation following other accident: Secondary | ICD-10-CM | POA: Diagnosis not present

## 2020-08-27 DIAGNOSIS — I4891 Unspecified atrial fibrillation: Secondary | ICD-10-CM | POA: Diagnosis not present

## 2020-08-27 DIAGNOSIS — Z20822 Contact with and (suspected) exposure to covid-19: Secondary | ICD-10-CM | POA: Diagnosis present

## 2020-08-27 DIAGNOSIS — M50322 Other cervical disc degeneration at C5-C6 level: Secondary | ICD-10-CM | POA: Diagnosis not present

## 2020-08-27 DIAGNOSIS — R410 Disorientation, unspecified: Secondary | ICD-10-CM | POA: Diagnosis not present

## 2020-08-27 DIAGNOSIS — E11649 Type 2 diabetes mellitus with hypoglycemia without coma: Secondary | ICD-10-CM | POA: Diagnosis present

## 2020-08-27 DIAGNOSIS — D649 Anemia, unspecified: Secondary | ICD-10-CM | POA: Diagnosis present

## 2020-08-27 DIAGNOSIS — Z8673 Personal history of transient ischemic attack (TIA), and cerebral infarction without residual deficits: Secondary | ICD-10-CM

## 2020-08-27 DIAGNOSIS — D631 Anemia in chronic kidney disease: Secondary | ICD-10-CM | POA: Diagnosis present

## 2020-08-27 DIAGNOSIS — E1143 Type 2 diabetes mellitus with diabetic autonomic (poly)neuropathy: Secondary | ICD-10-CM | POA: Diagnosis present

## 2020-08-27 DIAGNOSIS — R55 Syncope and collapse: Secondary | ICD-10-CM | POA: Diagnosis not present

## 2020-08-27 DIAGNOSIS — K219 Gastro-esophageal reflux disease without esophagitis: Secondary | ICD-10-CM | POA: Diagnosis present

## 2020-08-27 DIAGNOSIS — Z794 Long term (current) use of insulin: Secondary | ICD-10-CM

## 2020-08-27 DIAGNOSIS — Z8249 Family history of ischemic heart disease and other diseases of the circulatory system: Secondary | ICD-10-CM

## 2020-08-27 DIAGNOSIS — F32A Depression, unspecified: Secondary | ICD-10-CM | POA: Diagnosis present

## 2020-08-27 DIAGNOSIS — Z87891 Personal history of nicotine dependence: Secondary | ICD-10-CM

## 2020-08-27 DIAGNOSIS — R000 Tachycardia, unspecified: Secondary | ICD-10-CM | POA: Diagnosis not present

## 2020-08-27 DIAGNOSIS — K7581 Nonalcoholic steatohepatitis (NASH): Secondary | ICD-10-CM | POA: Diagnosis present

## 2020-08-27 DIAGNOSIS — F0781 Postconcussional syndrome: Secondary | ICD-10-CM | POA: Diagnosis present

## 2020-08-27 DIAGNOSIS — R918 Other nonspecific abnormal finding of lung field: Secondary | ICD-10-CM | POA: Diagnosis not present

## 2020-08-27 DIAGNOSIS — Z885 Allergy status to narcotic agent status: Secondary | ICD-10-CM

## 2020-08-27 DIAGNOSIS — E782 Mixed hyperlipidemia: Secondary | ICD-10-CM | POA: Diagnosis present

## 2020-08-27 LAB — GLUCOSE, CAPILLARY
Glucose-Capillary: 199 mg/dL — ABNORMAL HIGH (ref 70–99)
Glucose-Capillary: 178 mg/dL — ABNORMAL HIGH (ref 70–99)

## 2020-08-27 LAB — URINALYSIS, COMPLETE (UACMP) WITH MICROSCOPIC
Bilirubin Urine: NEGATIVE
Glucose, UA: 150 mg/dL — AB
Ketones, ur: NEGATIVE mg/dL
Nitrite: NEGATIVE
Protein, ur: 30 mg/dL — AB
RBC / HPF: >50 RBC/hpf — ABNORMAL HIGH (ref 0–5)
Specific Gravity, Urine: 1.01 (ref 1.005–1.030)
WBC, UA: >50 WBC/hpf — ABNORMAL HIGH (ref 0–5)
pH: 5 (ref 5.0–8.0)

## 2020-08-27 LAB — BASIC METABOLIC PANEL
Anion gap: 10 (ref 5–15)
BUN: 15 mg/dL (ref 8–23)
CO2: 22 mmol/L (ref 22–32)
Calcium: 8.9 mg/dL (ref 8.9–10.3)
Chloride: 106 mmol/L (ref 98–111)
Creatinine, Ser: 1.61 mg/dL — ABNORMAL HIGH (ref 0.44–1.00)
GFR, Estimated: 35 mL/min — ABNORMAL LOW (ref >60)
Glucose, Bld: 46 mg/dL — ABNORMAL LOW (ref 70–99)
Potassium: 2.8 mmol/L — ABNORMAL LOW (ref 3.5–5.1)
Sodium: 138 mmol/L (ref 135–145)

## 2020-08-27 LAB — CBC
HCT: 31.6 % — ABNORMAL LOW (ref 36.0–46.0)
Hemoglobin: 9.8 g/dL — ABNORMAL LOW (ref 12.0–15.0)
MCH: 26.2 pg (ref 26.0–34.0)
MCHC: 31 g/dL (ref 30.0–36.0)
MCV: 84.5 fL (ref 80.0–100.0)
Platelets: 171 10*3/uL (ref 150–400)
RBC: 3.74 MIL/uL — ABNORMAL LOW (ref 3.87–5.11)
RDW: 18.4 % — ABNORMAL HIGH (ref 11.5–15.5)
WBC: 7.5 10*3/uL (ref 4.0–10.5)
nRBC: 0 % (ref 0.0–0.2)

## 2020-08-27 LAB — BRAIN NATRIURETIC PEPTIDE: B Natriuretic Peptide: 162.7 pg/mL — ABNORMAL HIGH (ref 0.0–100.0)

## 2020-08-27 LAB — TROPONIN I (HIGH SENSITIVITY)
Troponin I (High Sensitivity): 13 ng/L (ref <18)
Troponin I (High Sensitivity): 14 ng/L (ref <18)

## 2020-08-27 LAB — RESP PANEL BY RT-PCR (FLU A&B, COVID) ARPGX2
Influenza A by PCR: NEGATIVE
Influenza B by PCR: NEGATIVE
SARS Coronavirus 2 by RT PCR: NEGATIVE

## 2020-08-27 LAB — CBG MONITORING, ED
Glucose-Capillary: 41 mg/dL — CL (ref 70–99)
Glucose-Capillary: 124 mg/dL — ABNORMAL HIGH (ref 70–99)
Glucose-Capillary: 148 mg/dL — ABNORMAL HIGH (ref 70–99)

## 2020-08-27 LAB — PHOSPHORUS: Phosphorus: 3.3 mg/dL (ref 2.5–4.6)

## 2020-08-27 LAB — MAGNESIUM: Magnesium: 2.1 mg/dL (ref 1.7–2.4)

## 2020-08-27 MED ORDER — NITROGLYCERIN 0.4 MG SL SUBL: 0.4000 mg | SUBLINGUAL_TABLET | SUBLINGUAL | Status: DC | PRN

## 2020-08-27 MED ORDER — DILTIAZEM HCL 25 MG/5ML IV SOLN
10.0000 mg | Freq: Once | INTRAVENOUS | Status: AC
  Administered 2020-08-27: 13:00:00 10 mg via INTRAVENOUS
  Filled 2020-08-27: qty 5

## 2020-08-27 MED ORDER — INSULIN ASPART 100 UNIT/ML ~~LOC~~ SOLN
0.0000 [IU] | Freq: Three times a day (TID) | SUBCUTANEOUS | Status: DC
  Administered 2020-08-28: 18:00:00 2 [IU] via SUBCUTANEOUS
  Administered 2020-08-28: 13:00:00 5 [IU] via SUBCUTANEOUS
  Administered 2020-08-28 – 2020-08-29 (×2): 3 [IU] via SUBCUTANEOUS
  Administered 2020-08-29: 09:00:00 11 [IU] via SUBCUTANEOUS
  Administered 2020-08-30: 09:00:00 5 [IU] via SUBCUTANEOUS
  Administered 2020-08-30 (×2): 3 [IU] via SUBCUTANEOUS
  Administered 2020-08-31: 08:00:00 8 [IU] via SUBCUTANEOUS
  Administered 2020-08-31: 12:00:00 3 [IU] via SUBCUTANEOUS
  Filled 2020-08-27 (×10): qty 1

## 2020-08-27 MED ORDER — SODIUM CHLORIDE 0.9 % IV SOLN
INTRAVENOUS | Status: DC | PRN
  Administered 2020-08-27 – 2020-08-29 (×3): 250 mL via INTRAVENOUS

## 2020-08-27 MED ORDER — POTASSIUM CHLORIDE CRYS ER 20 MEQ PO TBCR
40.0000 meq | EXTENDED_RELEASE_TABLET | Freq: Once | ORAL | Status: DC
  Filled 2020-08-27: qty 2

## 2020-08-27 MED ORDER — METOPROLOL TARTRATE 25 MG PO TABS
25.0000 mg | ORAL_TABLET | Freq: Two times a day (BID) | ORAL | Status: DC
Start: 2020-08-27 — End: 2020-08-31
  Administered 2020-08-27 – 2020-08-31 (×8): 25 mg via ORAL
  Filled 2020-08-27 (×8): qty 1

## 2020-08-27 MED ORDER — ACETAMINOPHEN 325 MG PO TABS: 650.0000 mg | ORAL_TABLET | Freq: Four times a day (QID) | ORAL | Status: DC | PRN

## 2020-08-27 MED ORDER — OXYCODONE HCL 5 MG PO TABS: 5.0000 mg | ORAL_TABLET | Freq: Four times a day (QID) | ORAL | Status: DC

## 2020-08-27 MED ORDER — OXYCODONE HCL 5 MG PO TABS
5.0000 mg | ORAL_TABLET | ORAL | Status: DC | PRN
  Administered 2020-08-27 – 2020-08-31 (×9): 5 mg via ORAL
  Filled 2020-08-27 (×9): qty 1

## 2020-08-27 MED ORDER — ACETAMINOPHEN 650 MG RE SUPP: 650.0000 mg | Freq: Four times a day (QID) | RECTAL | Status: DC | PRN

## 2020-08-27 MED ORDER — SERTRALINE HCL 50 MG PO TABS
50.0000 mg | ORAL_TABLET | Freq: Every day | ORAL | Status: DC
Start: 2020-08-27 — End: 2020-08-31
  Administered 2020-08-27 – 2020-08-31 (×5): 50 mg via ORAL
  Filled 2020-08-27 (×5): qty 1

## 2020-08-27 MED ORDER — PANTOPRAZOLE SODIUM 40 MG PO TBEC
40.0000 mg | DELAYED_RELEASE_TABLET | Freq: Every day | ORAL | Status: DC
Start: 2020-08-27 — End: 2020-08-31
  Administered 2020-08-27 – 2020-08-31 (×5): 40 mg via ORAL
  Filled 2020-08-27 (×5): qty 1

## 2020-08-27 MED ORDER — DEXTROSE 50 % IV SOLN
25.0000 g | Freq: Once | INTRAVENOUS | Status: AC
  Administered 2020-08-27: 14:00:00 25 g via INTRAVENOUS
  Filled 2020-08-27: qty 50

## 2020-08-27 MED ORDER — MAGNESIUM GLUCONATE 500 MG PO TABS
500.0000 mg | ORAL_TABLET | Freq: Two times a day (BID) | ORAL | Status: DC
  Administered 2020-08-27 – 2020-08-31 (×8): 500 mg via ORAL
  Filled 2020-08-27 (×10): qty 1

## 2020-08-27 MED ORDER — DILTIAZEM HCL-DEXTROSE 125-5 MG/125ML-% IV SOLN (PREMIX)
5.0000 mg/h | INTRAVENOUS | Status: DC
  Filled 2020-08-27: qty 125

## 2020-08-27 MED ORDER — INSULIN DETEMIR 100 UNIT/ML ~~LOC~~ SOLN
15.0000 [IU] | Freq: Two times a day (BID) | SUBCUTANEOUS | Status: DC
  Administered 2020-08-27 – 2020-08-29 (×4): 15 [IU] via SUBCUTANEOUS
  Filled 2020-08-27 (×5): qty 0.15

## 2020-08-27 MED ORDER — ENOXAPARIN SODIUM 30 MG/0.3ML ~~LOC~~ SOLN
30.0000 mg | SUBCUTANEOUS | Status: DC
  Administered 2020-08-27: 18:00:00 30 mg via SUBCUTANEOUS
  Filled 2020-08-27 (×2): qty 0.3

## 2020-08-27 MED ORDER — POTASSIUM CHLORIDE 10 MEQ/100ML IV SOLN
10.0000 meq | INTRAVENOUS | Status: AC
  Administered 2020-08-27 (×2): 10 meq via INTRAVENOUS
  Filled 2020-08-27 (×2): qty 100

## 2020-08-27 MED ORDER — ATORVASTATIN CALCIUM 20 MG PO TABS
40.0000 mg | ORAL_TABLET | Freq: Every day | ORAL | Status: DC
  Administered 2020-08-27 – 2020-08-30 (×4): 40 mg via ORAL
  Filled 2020-08-27 (×5): qty 2

## 2020-08-27 MED ORDER — DEXTROSE 10 % IV SOLN: INTRAVENOUS | Status: DC

## 2020-08-27 MED ORDER — MAGNESIUM SULFATE IN D5W 1-5 GM/100ML-% IV SOLN
1.0000 g | Freq: Once | INTRAVENOUS | Status: DC
  Filled 2020-08-27: qty 100

## 2020-08-27 MED ORDER — PROCHLORPERAZINE EDISYLATE 10 MG/2ML IJ SOLN
5.0000 mg | INTRAMUSCULAR | Status: DC | PRN
  Filled 2020-08-27: qty 1

## 2020-08-27 MED ORDER — SODIUM CHLORIDE 0.9 % IV SOLN
1.0000 g | INTRAVENOUS | Status: DC
  Administered 2020-08-27 – 2020-08-30 (×4): 1 g via INTRAVENOUS
  Filled 2020-08-27: qty 10
  Filled 2020-08-27 (×4): qty 1

## 2020-08-27 NOTE — ED Notes (Signed)
This EDT asked pt to verify birthday and pt was unable to remember birthday, pt is altered

## 2020-08-27 NOTE — ED Notes (Signed)
Taken to CT.

## 2020-08-27 NOTE — ED Triage Notes (Addendum)
Pt arrived via [facility] from home with reports of multiple falls, pt had multiple falls this morning, pt states she hit her head, pt was unable to get up and had to call EMS for help up.  Pt lives with husband.  Pt states she is not currently on Eliquis. Pt reports hx of CVA   Pt is not sure why she fell. Pt also reports confusion and having difficulty getting her words out at times.  Pt also states she has not been to bed in 2 days and has had decreased PO intake.  Pt reports the last fall she had today she did have + LOC.

## 2020-08-27 NOTE — ED Notes (Signed)
Pt given meal tray.

## 2020-08-27 NOTE — Progress Notes (Signed)
PHARMACIST - PHYSICIAN COMMUNICATION  CONCERNING:  Enoxaparin (Lovenox) for DVT Prophylaxis    RECOMMENDATION: Patient was prescribed enoxaprin 81m q24 hours for VTE prophylaxis.   Filed Weights   08/27/20 1108  Weight: 61.2 kg (135 lb)    Body mass index is 23.91 kg/m.  Estimated Creatinine Clearance: 28.4 mL/min (A) (by C-G formula based on SCr of 1.61 mg/dL (H)).   Patient is candidate for enoxaparin 372mevery 24 hours based on CrCl <3064min or Weight <45kg  DESCRIPTION: Pharmacy has [facility]  Patient is now receiving enoxaparin 62m28mery 24 hours    SamaSherilyn BankerarmD Pharmacy Resident  08/27/2020 2:01 PM

## 2020-08-27 NOTE — ED Notes (Signed)
This RN unable to obtain IV access. [name] set up at bedside for MD.

## 2020-08-27 NOTE — H&P (Addendum)
History and Physical    [name] LTJ:[id number] DOB: 1955/12/11 DOA: 08/27/2020  PCP: [name], NP   Patient coming from: Home.   I have personally briefly reviewed patient's old medical records in [place]  Chief Complaint: Falls and AMS.  HPI: [name] is a 64 y.o. female with medical history significant of seasonal allergies, anginal pain, anxiety, depression, liver cirrhosis due to steatohepatitis, uterine cancer, degenerative disc disease, diverticulosis/diverticulitis, gastroparesis, GERD/hiatal hernia, pancreatitis, interesting in tips of session, hypertension, hypothyroidism due to amiodarone, history of pneumonia, history of urolithiasis, history of other nonhemorrhagic CVA with mild left-sided hemiparesis, type 1 diabetes, paroxysmal atrial fibrillation, sick sinus syndrome, orthostatic hypotension who is coming to the emergency department via EMS due to having AMS and history of at least 3 falls at home.  The patient is unable to much history as she is still confused, but she states her glucose was elevated over 400 mg/dL and she used insulin.  She does not remember how many units and what type of insulin she used.  She woke up this morning with blurred vision, feeling very weak and thinks that her bed sheets were sweaty.  However, she is not able to elaborate much further on this, but is able to answer simple questions and denies having headache, chest pain, back or abdominal pain at this time.  She complains of dysuria and frequency.  She denies flank pain.  ED Course: Initial vital signs were temperature 98.1 F, pulse 120, respirations 16, BP 112/61 mmHg and O2 sat 96% on room air.  The patient was started on a Cardizem infusion after receiving a 10 mg bolus.  The Cardizem infusion was subsequently discontinued after the patient converted back to sinus rhythm.  She also received oral and IV potassium supplementation.  I ordered magnesium sulfate 1 g IVPB  due to prolonged QT interval.  Labwork: CBC showed a white count 7.5, hemoglobin 9.8 g/dL platelets 171.  Sodium 138, potassium 2.8, chloride 106 and CO2 22 mmol/L.  Glucose is 46, BUN 15 and creatinine 1.61 mg/dL.  BNP was 162.7 pg/mL.  Troponin level was normal.  Imaging: 1 view chest radiograph showed streaky left basilar opacities, which could represent atelectasis, aspiration or pneumonia.  CT head did not show any acute intracranial normality.  CT cervical spine did not show any acute cervical fracture.  Please see images and full radiology report for further detail.  Review of Systems: As per HPI otherwise all other systems reviewed and are negative.  Past Medical History:  Diagnosis Date  . Acute encephalopathy 05/22/2018  . Allergy   . Anginal pain ([place])   . Anxiety   . Ascites   . C. difficile colitis 07/10/2015  . Cancer (HCC)    HX OF CANCER OF UTERUS   . Cirrhosis of liver not due to alcohol ([place]) 2016  . Degenerative disk disease   . Diverticulitis   . Gastroparesis   . GERD (gastroesophageal reflux disease)   . History of hiatal hernia   . Hypertension   . Hypothyroid   . Hypothyroidism due to amiodarone   . Ileus ([place]) 08/01/2015  . Intussusception intestine ([place]) 05/2015  . Orthostatic hypotension   . PAF (paroxysmal atrial fibrillation) ([place]) 03/2015   a. new onset 03/2015 in setting of intractable N/V; b. on Eliquis 5 mg bid; c. CHADSVASc 4 (DM, TIA x 2, female)  . Pancreatitis   . Pneumonia 11/14/2015  . Pneumonia 07/2020  . Right  ureteral stone 07/14/2016  . Sepsis ([place]) 12/15/2017  . Sick sinus syndrome ([place])   . Stomach ulcer   . Stroke [facility])    with minimal left sided weakness  . Syncope 01/2015  . Syncope due to orthostatic hypotension 05/18/2015  . Tachyarrhythmia 01/10/2016  . TIA (transient ischemic attack) 02/2015  . Type 1 diabetes ([place])    on levemir  . UTERINE CANCER, HX OF 03/27/2007   Qualifier: Diagnosis of  By: Maxie Better FNP, Rosalita Levan   .  UTI (urinary tract infection) 05/22/2018   Past Surgical History:  Procedure Laterality Date  . ABDOMINAL HYSTERECTOMY    . CARDIAC CATHETERIZATION N/A 01/12/2016   Procedure: Left Heart Cath and Coronary Angiography;  Surgeon: Wellington Hampshire, MD;  Location: [facility];  Service: Cardiovascular;  Laterality: N/A;  . CHOLECYSTECTOMY    . CYSTOSCOPY W/ RETROGRADES Bilateral 12/11/2019   Procedure: CYSTOSCOPY WITH RETROGRADE PYELOGRAM;  Surgeon: Billey Co, MD;  Location: [facility] ORS;  Service: Urology;  Laterality: Bilateral;  . CYSTOSCOPY WITH BIOPSY N/A 12/11/2019   Procedure: CYSTOSCOPY WITH BLADDER BIOPSY;  Surgeon: Billey Co, MD;  Location: [facility];  Service: Urology;  Laterality: N/A;  . CYSTOSCOPY WITH FULGERATION N/A 12/11/2019   Procedure: CYSTOSCOPY WITH FULGERATION;  Surgeon: Billey Co, MD;  Location: [facility];  Service: Urology;  Laterality: N/A;  . CYSTOSCOPY WITH STENT PLACEMENT Right 12/11/2019   Procedure: CYSTOSCOPY WITH STENT PLACEMENT;  Surgeon: Billey Co, MD;  Location: [facility];  Service: Urology;  Laterality: Right;  . CYSTOSCOPY WITH URETHRAL DILATATION Right 12/11/2019   Procedure: CYSTOSCOPY WITH URETERAL DILATATION;  Surgeon: Billey Co, MD;  Location: [facility];  Service: Urology;  Laterality: Right;  . CYSTOSCOPY/URETEROSCOPY/HOLMIUM LASER Right 07/14/2016   Procedure: CYSTOSCOPY/URETEROSCOPY/HOLMIUM LASER;  Surgeon: Alexis Frock, MD;  Location: [facility];  Service: Urology;  Laterality: Right;  . ESOPHAGOGASTRODUODENOSCOPY N/A 04/04/2015   Procedure: ESOPHAGOGASTRODUODENOSCOPY (EGD);  Surgeon: Hulen Luster, MD;  Location: [facility];  Service: Endoscopy;  Laterality: N/A;  . ESOPHAGOGASTRODUODENOSCOPY N/A 12/28/2017   Procedure: ESOPHAGOGASTRODUODENOSCOPY (EGD);  Surgeon: Lin Landsman, MD;  Location: [facility] END[facility]COPY;  Service: Gastroenterology;  Laterality: N/A;  . ESOPHAGOGASTRODUODENOSCOPY (EGD) WITH PROPOFOL N/A 01/18/2016    Procedure: ESOPHAGOGASTRODUODENOSCOPY (EGD) WITH PROPOFOL;  Surgeon: Lucilla Lame, MD;  Location: [facility] ENDOSCOPY;  Service: Endoscopy;  Laterality: N/A;  . FLEXIBLE SIGMOIDOSCOPY N/A 01/18/2016   Procedure: FLEXIBLE SIGMOIDOSCOPY;  Surgeon: Lucilla Lame, MD;  Location: [facility] ENDOSCOPY;  Service: Endoscopy;  Laterality: N/A;  . HERNIA REPAIR    . URETEROSCOPY Right 12/11/2019   Procedure: Christen Butter;  Surgeon: Billey Co, MD;  Location: [facility] ORS;  Service: Urology;  Laterality: Right;   Social History  reports that she has quit smoking. Her smoking use included cigarettes. She has never used smokeless tobacco. She reports that she does not drink alcohol and does not use drugs.  Allergies  Allergen Reactions  . Aspirin Rash  . Codeine Sulfate Rash  . Erythromycin Rash    Reaction:  Fever   . Prednisone Swelling  . Rosiglitazone Maleate Swelling  . Tetanus-Diphtheria Toxoids Td Rash and Other (See Comments)    Reaction:  Fever    Family History  Problem Relation Age of Onset  . Hypertension Mother   . CAD Sister   . Heart attack Sister        Deceased 2014-12-02  . CAD Brother    Prior to Admission medications   Medication Sig Start Date  End Date Taking? Authorizing Provider  albuterol (VENTOLIN HFA) 108 (90 Base) MCG/ACT inhaler Inhale 2 puffs into the lungs every 4 (four) hours as needed for wheezing or shortness of breath. 07/18/20   Elodia Florence., MD  atorvastatin (LIPITOR) 40 MG tablet Take 40 mg by mouth at bedtime.     [provider]  cholecalciferol (VITAMIN D3) 25 MCG (1000 UNIT) tablet Take 1,000 Units by mouth daily.    [provider]  cyclobenzaprine (FLEXERIL) 10 MG tablet Take 1 tablet (10 mg total) by mouth 3 (three) times daily as needed for muscle spasms. Must last 30 days. 07/07/20 10/05/20  Milinda Pointer, MD  glucose blood (ACCU-CHEK AVIVA PLUS) test strip Use as instructed to test blood sugar up to 4 times daily 10/09/19    [name], NP  insulin detemir (LEVEMIR FLEXTOUCH) 100 UNIT/ML FlexPen INJECT 30 UNITS SUBCUTANEOUSLY EVERY MORNING AND AT BEDTIME 08/19/20   [name], NP  magnesium gluconate (MAGONATE) 500 MG tablet Take 500 mg by mouth 2 (two) times daily.    [provider]  metoprolol tartrate (LOPRESSOR) 25 MG tablet Take 25 mg by mouth 2 (two) times daily. 04/08/20   [provider]  nitroGLYCERIN (NITROSTAT) 0.3 MG SL tablet Place 1 tablet (0.3 mg total) under the tongue every 5 (five) minutes as needed for chest pain. 07/24/19 07/23/20  Nena Polio, MD  oxyCODONE (OXY IR/ROXICODONE) 5 MG immediate release tablet Take 1 tablet (5 mg total) by mouth 5 (five) times daily for 7 days. Max: 5/day. Must last 7 days. 08/17/20 08/24/20  Milinda Pointer, MD  oxyCODONE (OXY IR/ROXICODONE) 5 MG immediate release tablet Take 1 tablet (5 mg total) by mouth 4 (four) times daily for 7 days. Max: 4/day. Must last 7 days. 08/24/20 08/31/20  Milinda Pointer, MD  oxyCODONE (OXY IR/ROXICODONE) 5 MG immediate release tablet Take 1 tablet (5 mg total) by mouth 3 (three) times daily for 7 days. Max: 3/day. Must last 7 days. 08/31/20 09/07/20  Milinda Pointer, MD  oxyCODONE (OXY IR/ROXICODONE) 5 MG immediate release tablet Take 1 tablet (5 mg total) by mouth 2 (two) times daily for 7 days. Max: 2/day. Must last 7 days. 09/07/20 09/14/20  Milinda Pointer, MD  oxyCODONE (OXY IR/ROXICODONE) 5 MG immediate release tablet Take 1 tablet (5 mg total) by mouth daily for 7 days. Max: 1/day. Must last 7 days. 09/14/20 09/21/20  Milinda Pointer, MD  pantoprazole (PROTONIX) 40 MG tablet Take 1 tablet (40 mg total) by mouth daily. 07/18/20 08/17/20  Elodia Florence., MD  sertraline (ZOLOFT) 50 MG tablet TAKE ONE TABLET EVERY DAY 06/03/20   [name], NP  Spacer/Aero-Hold Chamber Bags MISC Use spacer with inhaler when you have shortness of breath 07/18/20   Elodia Florence., MD    Physical Exam: Vitals:   08/27/20 1300 08/27/20 1330 08/27/20 1400 08/27/20 1415  BP: (!) 109/57 (!) 112/59 111/60   Pulse: 99 100 98 98  Resp: 16 15 15 15   Temp:      TempSrc:      SpO2: 97% 97% 99% 96%  Weight:      Height:       Constitutional: NAD, calm, comfortable Eyes: PERRL, lids and conjunctivae pale. ENMT: Mucous membranes are moist. Posterior pharynx clear of any exudate or lesions. Neck: normal, supple, no masses, no thyromegaly Respiratory: clear to auscultation bilaterally, no wheezing, no crackles. Normal respiratory effort. No accessory muscle use.  Cardiovascular: Regular  rate and rhythm, no murmurs / rubs / gallops. No extremity edema. 2+ pedal pulses. No carotid bruits.  Abdomen: No distention.  Bowel sounds positive.  Soft, no tenderness, no masses palpated. No hepatosplenomegaly. Musculoskeletal: Generalized weakness.  No clubbing / cyanosis. Good ROM, no contractures. Normal muscle tone.  Skin: no rashes, lesions, ulcers on very limited dermatological examination. Neurologic: CN 2-12 grossly intact. Sensation intact, DTR normal. Strength 4 /5 in all 4.  Psychiatric: Oriented to name, thought she was at the clinic.  She is also disoriented to time, date and situation, but mental status seems to be improving after glucose increased.  Labs on Admission: I have personally reviewed following labs and imaging studies  CBC: Recent Labs  Lab 08/25/20 1116  WBC 7.5  HGB 9.8*  HCT 31.6*  MCV 84.5  PLT 224    Basic Metabolic Panel: Recent Labs  Lab 08/25/20 1116 08/27/20 1216  NA 138  --   K 2.8*  --   CL 106  --   CO2 22  --   GLUCOSE 46*  --   BUN 15  --   CREATININE 1.61*  --   CALCIUM 8.9  --   MG  --  2.1   GFR: Estimated Creatinine Clearance: 28.4 mL/min (A) (by C-G formula based on SCr of 1.61 mg/dL (H)).  Liver Function Tests: No results for input(s): AST, ALT, ALKPHOS, BILITOT, PROT, ALBUMIN in the last 168 hours.  Radiological Exams  on Admission: CT HEAD WO CONTRAST  Result Date: 08/27/2020 CLINICAL DATA:  Patient status post fall.  Patient on Eliquis. EXAM: CT HEAD WITHOUT CONTRAST CT CERVICAL SPINE WITHOUT CONTRAST TECHNIQUE: Multidetector CT imaging of the head and cervical spine was performed following the standard protocol without intravenous contrast. Multiplanar CT image reconstructions of the cervical spine were also generated. COMPARISON:  Brain CT 01/23/2020 FINDINGS: CT HEAD FINDINGS Brain: No evidence of acute infarction, hemorrhage, hydrocephalus, extra-axial collection or mass lesion/mass effect. Ventricles and sulci are appropriate patient's age. Vascular: Unremarkable Skull: Intact. Sinuses/Orbits: There is nonspecific opacification of the left mastoid air cells. Right mastoid air cells are well aerated. Paranasal sinuses are well aerated. Other: None. CT CERVICAL SPINE FINDINGS Alignment: Normal. Skull base and vertebrae: No acute fracture. No primary bone lesion or focal pathologic process. Soft tissues and spinal canal: No prevertebral fluid or swelling. No visible canal hematoma. Disc levels: No acute fracture. Degenerative disc disease most pronounced C5-6. Upper chest: Negative. Other: None IMPRESSION: 1. No acute intracranial process. 2. No acute cervical spine fracture. 3. Nonspecific opacification of the left mastoid air cells. Electronically Signed   By: Lovey Newcomer M.D.   On: 08/27/2020 12:12   CT Cervical Spine Wo Contrast  Result Date: 08/27/2020 CLINICAL DATA:  Patient status post fall.  Patient on Eliquis. EXAM: CT HEAD WITHOUT CONTRAST CT CERVICAL SPINE WITHOUT CONTRAST TECHNIQUE: Multidetector CT imaging of the head and cervical spine was performed following the standard protocol without intravenous contrast. Multiplanar CT image reconstructions of the cervical spine were also generated. COMPARISON:  Brain CT 01/23/2020 FINDINGS: CT HEAD FINDINGS Brain: No evidence of acute infarction, hemorrhage,  hydrocephalus, extra-axial collection or mass lesion/mass effect. Ventricles and sulci are appropriate patient's age. Vascular: Unremarkable Skull: Intact. Sinuses/Orbits: There is nonspecific opacification of the left mastoid air cells. Right mastoid air cells are well aerated. Paranasal sinuses are well aerated. Other: None. CT CERVICAL SPINE FINDINGS Alignment: Normal. Skull base and vertebrae: No acute fracture. No primary bone lesion or focal  pathologic process. Soft tissues and spinal canal: No prevertebral fluid or swelling. No visible canal hematoma. Disc levels: No acute fracture. Degenerative disc disease most pronounced C5-6. Upper chest: Negative. Other: None IMPRESSION: 1. No acute intracranial process. 2. No acute cervical spine fracture. 3. Nonspecific opacification of the left mastoid air cells. Electronically Signed   By: Lovey Newcomer M.D.   On: 08/27/2020 12:12   DG Chest Portable 1 View  Result Date: 08/27/2020 CLINICAL DATA:  Fever, cough. EXAM: PORTABLE CHEST 1 VIEW COMPARISON:  July 18, 2020. FINDINGS: Low lung volumes. Streaky left basilar opacities. No visible pleural effusions or pneumothorax. Mildly enlarged cardiac silhouette. Aortic atherosclerosis. No acute osseous abnormality. IMPRESSION: Streaky left basilar opacities, which could represent atelectasis, aspiration, and/or pneumonia. Dedicated PA and lateral radiographs could further evaluate if clinically indicated. Electronically Signed   By: Margaretha Sheffield MD   On: 08/27/2020 13:17    EKG: Independently reviewed. Vent. rate 120 BPM PR interval * ms QRS duration 84 ms QT/QTc 428/604 ms P-R-T axes * 14 49 Critical Test Result: Long QTc Accelerated Junctional rhythm Possible Anterior infarct , age undetermined Abnormal ECG  Assessment/Plan Principal Problem:   Paroxysmal atrial fibrillation with RVR (HCC) Converted back to sinus after hypoglycemia treatment. Observation/progressive cardiac unit. Keep  electrolytes optimized. Continue metoprolol 25 mg p.o. twice daily. Avoid positive chronotropic medication.  Active Problems:   Hypoglycemia   Type 2 diabetes mellitus with neurologic complication (HCC) Resolved with D50/food. Continue carbohydrate modified diet. Resume Levemir if CBG trending up. Resume CBG monitoring with RI SS in the morning.    Acute lower UTI Given her presentation will treat parenterally. Start Rocephin 1 g IVPB every 24 hours.    Hypokalemia Replacing. Follow-up potassium level.    Prolonged QT interval Optimize electrolytes. Magnesium sulfate 1 g IVPB ordered. Continue oral magnesium supplementation. Avoid medications that cause QT interval prolongation. Continue metoprolol 25 mg p.o. twice daily.    GERD Continue pantoprazole 40 mg p.o. daily.    Essential hypertension Continue metoprolol 25 mg p.o. twice daily. Monitor blood pressure and heart rate.    Normochromic normocytic anemia Monitor H&H.    Hyperlipidemia Continue atorvastatin 40 mg p.o. daily.    CKD (chronic kidney disease), stage III (HCC) Monitor renal function electrolytes.   DVT prophylaxis: Lovenox SQ. Code Status:   Full code. Family Communication:   Disposition Plan:   Patient is from:  Home.  Anticipated DC to:  TBD.  Anticipated DC date:  08/28/2020.  Anticipated DC barriers: Clinical status.  Consults called:  PT/OT and TOC team Admission status:  Observation/telemetry.  Severity of Illness:  High due to hypoglycemia presenting with atrial fibrillation with RVR and altered mental status.  The patient will need to remain hospitalized for 24 to 48 hours.  Reubin Milan MD [facility]  How to contact the [facility] Attending or Consulting provider [place] or covering provider during after hours [place], for this patient?   1. Check the care team in [facility] and look for a) attending/consulting [facility] provider listed and b) the [facility] team listed 2. Log into  [email or web address] and use [place]'s universal password to access. If you do not have the password, please contact the hospital operator. 3. Locate the [facility] provider you are looking for under [facility] and page to a number that you can be directly reached. 4. If you still have difficulty reaching the provider, please page the [facility] (Director on Call) for the Hospitalists listed  on amion for assistance.  08/27/2020, 3:17 PM   This document was prepared using Dragon voice recognition software and may contain some unintended transcription errors.

## 2020-08-27 NOTE — ED Notes (Addendum)
MD messaged in regards if diltiazem drip needs to be initiated. Pt remains with only one IV access at this time with K+ infusion still running. Pt currently with controlled rate between 95-99 but remains in afib rhythm. Waiting for provider to message back

## 2020-08-27 NOTE — ED Triage Notes (Signed)
Pt in via EMS from home with c/o 2-3 falls over the past day. Pt fell this am and hit the back of her head, pt on eloquis. 101/59, HR 122, 16 RR, 95% RA, FSBS 106

## 2020-08-27 NOTE — ED Provider Notes (Signed)
[facility] Emergency Department Provider Note ____________________________________________   Event Date/Time   First MD Initiated Contact with Patient 08/27/20 1136     (approximate)  I have reviewed the triage vital signs and the nursing notes.  HISTORY  Chief Complaint Fall and Loss of Consciousness   HPI [name] is a 64 y.o. femalewho presents to the ED for evaluation of falls.  Chart review indicates history of DM on insulin, HTN, HLD, stroke, A. fib not on AC, CAD.  Admitted to our facility 1 month ago for 2 days due to CAP.  Patient presents from home after 3 falls that occurred this morning, and a possible syncopal episode that occurred during the last fall.  Patient reports sensation of generalized weakness, heart palpitations and intermittent sharp chest pains.  She denies emesis, abdominal pain, diarrhea, productive cough or fevers.  Patient is oriented only to self and cannot provide significant relevant history due to her disorientation. I called the husband who did not answer to acquire additional history.  Past Medical History:  Diagnosis Date  . Acute encephalopathy 05/22/2018  . Allergy   . Anginal pain ([place])   . Anxiety   . Ascites   . C. difficile colitis 07/10/2015  . Cancer (HCC)    HX OF CANCER OF UTERUS   . Cirrhosis of liver not due to alcohol ([place]) 2016  . Degenerative disk disease   . Diverticulitis   . Gastroparesis   . GERD (gastroesophageal reflux disease)   . History of hiatal hernia   . Hypertension   . Hypothyroid   . Hypothyroidism due to amiodarone   . Ileus ([place]) 08/01/2015  . Intussusception intestine ([place]) 05/2015  . Orthostatic hypotension   . PAF (paroxysmal atrial fibrillation) ([place]) 03/2015   a. new onset 03/2015 in setting of intractable N/V; b. on Eliquis 5 mg bid; c. CHADSVASc 4 (DM, TIA x 2, female)  . Pancreatitis   . Pneumonia 11/14/2015  . Pneumonia 07/2020  . Right ureteral stone  07/14/2016  . Sepsis ([place]) 12/15/2017  . Sick sinus syndrome ([place])   . Stomach ulcer   . Stroke [facility])    with minimal left sided weakness  . Syncope 01/2015  . Syncope due to orthostatic hypotension 05/18/2015  . Tachyarrhythmia 01/10/2016  . TIA (transient ischemic attack) 02/2015  . Type 1 diabetes ([place])    on levemir  . UTERINE CANCER, HX OF 03/27/2007   Qualifier: Diagnosis of  By: Maxie Better FNP, Rosalita Levan   . UTI (urinary tract infection) 05/22/2018    Patient Active Problem List   Diagnosis Date Noted  . Shortness of breath 07/16/2020  . [place] intertrigo 07/16/2020  . Sepsis ([place]) 11/23/2019  . Acute pyelonephritis 09/30/2019  . Acute pain of left foot 08/18/2019  . Type II diabetes mellitus with renal manifestations ([place]) 08/13/2019  . Weakness of both lower extremities 07/31/2019  . Altered mental status 07/14/2019  . GAD (generalized anxiety disorder) 06/11/2019  . Poor memory 06/11/2019  . Constipation 05/25/2019  . Acute medial meniscus tear of left knee 05/25/2019  . AKI (acute kidney injury) ([place]) 05/16/2019  . Closed fracture of left distal fibula 05/16/2019  . Sinus tachycardia 05/05/2019  . Congestive dilated cardiomyopathy ([place]) 04/14/2019  . CAD (coronary artery disease) 04/10/2019  . Closed fracture of lateral malleolus 04/10/2019  . Lumbar facet syndrome (Bilateral) (R>L) 03/09/2019  . Long term (current) use of anticoagulants 03/09/2019  . Unspecified cirrhosis of liver ([place]) 02/11/2019  .  Hypoalbuminemia 02/11/2019  . Edema due to hypoalbuminemia 02/11/2019  . Lower extremity edema 01/26/2019  . Prolonged QT interval 12/17/2018  . Hypomagnesemia 12/17/2018  . Uncontrolled type 2 diabetes mellitus ([place]) 12/17/2018  . H/O TIA (transient ischemic attack) and stroke 12/17/2018  . Personal history of surgery to heart and great vessels, presenting hazards to health 12/17/2018  . DDD (degenerative disc disease), lumbar 12/17/2018  . Lumbar intervertebral  disc displacement 12/17/2018  . Chronic musculoskeletal pain 12/17/2018  . Chronic generalized pain 12/17/2018  . Hyponatremia 12/17/2018  . Fibromyalgia syndrome 12/17/2018  . Other intervertebral disc displacement, lumbar region 12/17/2018  . Vitamin D deficiency 11/26/2018  . Elevated C-reactive protein (CRP) 11/26/2018  . Elevated sed rate 11/26/2018  . Chronic low back pain (Primary area of Pain) (Bilateral) (L>R) w/ sciatica (Bilateral) 11/25/2018  . Chronic lower extremity pain (Secondary Area of Pain) (Bilateral) (L>R) 11/25/2018  . Chronic neck pain 11/25/2018  . Pharmacologic therapy 11/25/2018  . Disorder of skeletal system 11/25/2018  . Problems influencing health status 11/25/2018  . Renal mass 10/06/2018  . Chronic intermittent abdominal pain 09/25/2018  . Unspecified abdominal pain 09/25/2018  . Frequent falls 09/12/2018  . Orthostatic hypotension 08/18/2018  . Normochromic normocytic anemia 08/18/2018  . Acute renal failure superimposed on stage 3a chronic kidney disease ([place]) 08/18/2018  . History of hypothyroidism 08/14/2018  . Medical non-compliance 08/14/2018  . Diabetic ketoacidosis ([place]) 05/22/2018  . UTI (urinary tract infection) 05/22/2018  . NSTEMI (non-ST elevated myocardial infarction) ([place]) 01/11/2016  . Tachy-brady syndrome ([place]) 12/28/2015  . PAF (paroxysmal atrial fibrillation) ([place]) 11/24/2015  . Type 2 diabetes mellitus with neurologic complication ([place]) 16/06/9603  . S/P cholecystectomy 11/23/2015  . Narcotic abuse ([place]) 11/22/2015  . Chronic superficial gastritis without bleeding 11/22/2015  . History of Clostridium difficile 11/22/2015  . History of TIA (transient ischemic attack) 11/22/2015  . Mixed hyperlipidemia 11/22/2015  . Diverticulosis 11/22/2015  . History of uterine cancer 11/22/2015  . Hypothyroidism, unspecified 11/22/2015  . Intractable cyclical vomiting with nausea 11/22/2015  . Community acquired pneumonia of left lower lobe  of lung 11/14/2015  . Narcotic withdrawal ([place]) 11/11/2015  . Hyperlipidemia 08/31/2015  . Hypotension 08/06/2015  . Cryptogenic cirrhosis ([place]) 07/10/2015  . Atrial fibrillation ([place]) 07/10/2015  . Malnutrition of moderate degree 07/04/2015  . Symptomatic bradycardia 05/27/2015  . Chronic anemia 05/19/2015  . Thrombocytopenia ([place]) 05/19/2015  . Hypokalemia 04/06/2015  . Hyperlipidemia with target LDL less than 100 04/06/2015  . Syncope 03/11/2015  . CVA (cerebral vascular accident) ([place]) 02/15/2015  . Essential hypertension 01/12/2015  . Chronically on opiate therapy 01/12/2015  . Cirrhosis of liver not due to alcohol ([place]) 2016  . S/P Nissen fundoplication (without gastrostomy tube) procedure 05/11/2014  . Dysphagia 04/19/2014  . DEPRESSION/ANXIETY 06/27/2007  . Myofascial pain syndrome 06/27/2007  . Chronic pain syndrome 03/28/2007  . GERD 03/27/2007  . Diverticulosis of large intestine without perforation or abscess without bleeding 03/27/2007  . Proteinuria 03/27/2007    Past Surgical History:  Procedure Laterality Date  . ABDOMINAL HYSTERECTOMY    . CARDIAC CATHETERIZATION N/A 01/12/2016   Procedure: Left Heart Cath and Coronary Angiography;  Surgeon: Wellington Hampshire, MD;  Location: [place] [facility];  Service: Cardiovascular;  Laterality: N/A;  . CHOLECYSTECTOMY    . CYSTOSCOPY W/ RETROGRADES Bilateral 12/11/2019   Procedure: CYSTOSCOPY WITH RETROGRADE PYELOGRAM;  Surgeon: Billey Co, MD;  Location: [facility] ORS;  Service: Urology;  Laterality: Bilateral;  . CYSTOSCOPY WITH BIOPSY N/A  12/11/2019   Procedure: CYSTOSCOPY WITH BLADDER BIOPSY;  Surgeon: Billey Co, MD;  Location: [facility];  Service: Urology;  Laterality: N/A;  . CYSTOSCOPY WITH FULGERATION N/A 12/11/2019   Procedure: CYSTOSCOPY WITH FULGERATION;  Surgeon: Billey Co, MD;  Location: [facility];  Service: Urology;  Laterality: N/A;  . CYSTOSCOPY WITH STENT PLACEMENT Right 12/11/2019   Procedure:  CYSTOSCOPY WITH STENT PLACEMENT;  Surgeon: Billey Co, MD;  Location: [facility];  Service: Urology;  Laterality: Right;  . CYSTOSCOPY WITH URETHRAL DILATATION Right 12/11/2019   Procedure: CYSTOSCOPY WITH URETERAL DILATATION;  Surgeon: Billey Co, MD;  Location: [facility];  Service: Urology;  Laterality: Right;  . CYSTOSCOPY/URETEROSCOPY/HOLMIUM LASER Right 07/14/2016   Procedure: CYSTOSCOPY/URETEROSCOPY/HOLMIUM LASER;  Surgeon: Alexis Frock, MD;  Location: [facility];  Service: Urology;  Laterality: Right;  . ESOPHAGOGASTRODUODENOSCOPY N/A 04/04/2015   Procedure: ESOPHAGOGASTRODUODENOSCOPY (EGD);  Surgeon: Hulen Luster, MD;  Location: [facility] ENDOSCOPY;  Service: Endoscopy;  Laterality: N/A;  . ESOPHAGOGASTRODUODENOSCOPY N/A 12/28/2017   Procedure: ESOPHAGOGASTRODUODENOSCOPY (EGD);  Surgeon: Lin Landsman, MD;  Location: [facility]COPY;  Service: Gastroenterology;  Laterality: N/A;  . ESOPHAGOGASTRODUODENOSCOPY (EGD) WITH PROPOFOL N/A 01/18/2016   Procedure: ESOPHAGOGASTRODUODENOSCOPY (EGD) WITH PROPOFOL;  Surgeon: Lucilla Lame, MD;  Location: [facility] ENDOSCOPY;  Service: Endoscopy;  Laterality: N/A;  . FLEXIBLE SIGMOIDOSCOPY N/A 01/18/2016   Procedure: FLEXIBLE SIGMOIDOSCOPY;  Surgeon: Lucilla Lame, MD;  Location: [facility] ENDOSCOPY;  Service: Endoscopy;  Laterality: N/A;  . HERNIA REPAIR    . URETEROSCOPY Right 12/11/2019   Procedure: Christen Butter;  Surgeon: Billey Co, MD;  Location: [facility] OR[facility];  Service: Urology;  Laterality: Right;    Prior to Admission medications   Medication Sig Start Date End Date Taking? Authorizing Provider  albuterol (VENTOLIN HFA) 108 (90 Base) MCG/ACT inhaler Inhale 2 puffs into the lungs every 4 (four) hours as needed for wheezing or shortness of breath. 07/18/20   Elodia Florence., MD  atorvastatin (LIPITOR) 40 MG tablet Take 40 mg by mouth at bedtime.     [provider]  cholecalciferol (VITAMIN D3) 25 MCG (1000 UNIT) tablet Take 1,000  Units by mouth daily.    [provider]  cyclobenzaprine (FLEXERIL) 10 MG tablet Take 1 tablet (10 mg total) by mouth 3 (three) times daily as needed for muscle spasms. Must last 30 days. 07/07/20 10/05/20  Milinda Pointer, MD  glucose blood (ACCU-CHEK AVIVA PLUS) test strip Use as instructed to test blood sugar up to 4 times daily 10/09/19   [name], NP  insulin detemir (LEVEMIR FLEXTOUCH) 100 UNIT/ML FlexPen INJECT 30 UNITS SUBCUTANEOUSLY EVERY MORNING AND AT BEDTIME 08/19/20   [name], NP  magnesium gluconate (MAGONATE) 500 MG tablet Take 500 mg by mouth 2 (two) times daily.    [provider]  metoprolol tartrate (LOPRESSOR) 25 MG tablet Take 25 mg by mouth 2 (two) times daily. 04/08/20   [provider]  nitroGLYCERIN (NITROSTAT) 0.3 MG SL tablet Place 1 tablet (0.3 mg total) under the tongue every 5 (five) minutes as needed for chest pain. 07/24/19 07/23/20  Nena Polio, MD  oxyCODONE (OXY IR/ROXICODONE) 5 MG immediate release tablet Take 1 tablet (5 mg total) by mouth 5 (five) times daily for 7 days. Max: 5/day. Must last 7 days. 08/17/20 08/24/20  Milinda Pointer, MD  oxyCODONE (OXY IR/ROXICODONE) 5 MG immediate release tablet Take 1 tablet (5 mg total) by mouth 4 (four) times daily for 7 days. Max: 4/day. Must last  7 days. 08/24/20 08/31/20  Milinda Pointer, MD  oxyCODONE (OXY IR/ROXICODONE) 5 MG immediate release tablet Take 1 tablet (5 mg total) by mouth 3 (three) times daily for 7 days. Max: 3/day. Must last 7 days. 08/31/20 09/07/20  Milinda Pointer, MD  oxyCODONE (OXY IR/ROXICODONE) 5 MG immediate release tablet Take 1 tablet (5 mg total) by mouth 2 (two) times daily for 7 days. Max: 2/day. Must last 7 days. 09/07/20 09/14/20  Milinda Pointer, MD  oxyCODONE (OXY IR/ROXICODONE) 5 MG immediate release tablet Take 1 tablet (5 mg total) by mouth daily for 7 days. Max: 1/day. Must last 7 days. 09/14/20 09/21/20  Milinda Pointer, MD   pantoprazole (PROTONIX) 40 MG tablet Take 1 tablet (40 mg total) by mouth daily. 07/18/20 08/17/20  Elodia Florence., MD  sertraline (ZOLOFT) 50 MG tablet TAKE ONE TABLET EVERY DAY 06/03/20   [name], NP  Spacer/Aero-Hold Chamber Bags MISC Use spacer with inhaler when you have shortness of breath 07/18/20   Elodia Florence., MD    Allergies Aspirin, Codeine sulfate, Erythromycin, Prednisone, Rosiglitazone maleate, and Tetanus-diphtheria toxoids td  Family History  Problem Relation Age of Onset  . Hypertension Mother   . CAD Sister   . Heart attack Sister        Deceased 11-12-2014  . CAD Brother     Social History Social History   Tobacco Use  . Smoking status: Former Smoker    Types: Cigarettes  . Smokeless tobacco: Never Used  . Tobacco comment: 25 years ago and only smoked occasionally  Vaping Use  . Vaping Use: Never used  Substance Use Topics  . Alcohol use: No  . Drug use: No    Review of Systems  Unable to be accurately assessed due to patient's disorientation. ____________________________________________   PHYSICAL EXAM:  VITAL SIGNS: Vitals:   08/27/20 1056 08/27/20 1200  BP: 112/61 129/60  Pulse: (!) 120 (!) 114  Resp: 16 (!) 27  Temp: 98.1 F (36.7 C)   SpO2: 96% 100%    Constitutional: Alert and pleasantly disoriented.  No distress. Eyes: Conjunctivae are normal. PERRL. EOMI. Head: Atraumatic. Nose: No congestion/rhinnorhea. Mouth/Throat: Mucous membranes are dry.  Oropharynx non-erythematous. Neck: No stridor. No cervical spine tenderness to palpation. Cardiovascular: Tachycardic and irregular.  Grossly normal heart sounds.  Good peripheral circulation. Respiratory: Mild tachypnea to the low 20s, no further evidence of distress.  Faint bibasilar crackles without wheezes.  Otherwise clear in the upper lung fields. Gastrointestinal: Soft , nondistended. No CVA tenderness. Mild suprapubic tenderness to palpation without peritoneal  features.  Abdomen otherwise benign. Musculoskeletal: No lower extremity tenderness.  No joint effusions. No signs of acute trauma. Pitting edema to bilateral lower extremity symmetrically without overlying skin changes or signs of trauma. Neurologic:  Normal speech and language. No gross focal neurologic deficits are appreciated. Skin:  Skin is warm, dry and intact. No rash noted. Psychiatric: Mood and affect are normal. Speech and behavior are normal. ____________________________________________   LABS (all labs ordered are listed, but only abnormal results are displayed)  Labs Reviewed  BASIC METABOLIC PANEL - Abnormal; Notable for the following components:      Result Value   Potassium 2.8 (*)    Glucose, Bld 46 (*)    Creatinine, Ser 1.61 (*)    GFR, Estimated 35 (*)    All other components within normal limits  CBC - Abnormal; Notable for the following components:   RBC 3.74 (*)  Hemoglobin 9.8 (*)    HCT 31.6 (*)    RDW 18.4 (*)    All other components within normal limits  BRAIN NATRIURETIC PEPTIDE - Abnormal; Notable for the following components:   B Natriuretic Peptide 162.7 (*)    All other components within normal limits  RESP PANEL BY RT-PCR (FLU A&B, COVID) ARPGX2  MAGNESIUM  URINALYSIS, COMPLETE (UACMP) WITH MICROSCOPIC  CBG MONITORING, ED  TROPONIN I (HIGH SENSITIVITY)  TROPONIN I (HIGH SENSITIVITY)   ____________________________________________  12 Lead EKG  A. fib versus junctional rhythm at a rate of 120 bpm.  Normal axis.  QTc 604 seconds and otherwise normal intervals.  No evidence of acute ischemia. ____________________________________________  RADIOLOGY  ED MD interpretation: CT head reviewed by me without evidence of acute ICH. 1 view CXR reviewed by me with evidence of pulmonary vascular congestion  Official radiology report(s): CT HEAD WO CONTRAST  Result Date: 08/27/2020 CLINICAL DATA:  Patient status post fall.  Patient on Eliquis.  EXAM: CT HEAD WITHOUT CONTRAST CT CERVICAL SPINE WITHOUT CONTRAST TECHNIQUE: Multidetector CT imaging of the head and cervical spine was performed following the standard protocol without intravenous contrast. Multiplanar CT image reconstructions of the cervical spine were also generated. COMPARISON:  Brain CT 01/23/2020 FINDINGS: CT HEAD FINDINGS Brain: No evidence of acute infarction, hemorrhage, hydrocephalus, extra-axial collection or mass lesion/mass effect. Ventricles and sulci are appropriate patient's age. Vascular: Unremarkable Skull: Intact. Sinuses/Orbits: There is nonspecific opacification of the left mastoid air cells. Right mastoid air cells are well aerated. Paranasal sinuses are well aerated. Other: None. CT CERVICAL SPINE FINDINGS Alignment: Normal. Skull base and vertebrae: No acute fracture. No primary bone lesion or focal pathologic process. Soft tissues and spinal canal: No prevertebral fluid or swelling. No visible canal hematoma. Disc levels: No acute fracture. Degenerative disc disease most pronounced C5-6. Upper chest: Negative. Other: None IMPRESSION: 1. No acute intracranial process. 2. No acute cervical spine fracture. 3. Nonspecific opacification of the left mastoid air cells. Electronically Signed   By: Lovey Newcomer M.D.   On: 08/27/2020 12:12   CT Cervical Spine Wo Contrast  Result Date: 08/27/2020 CLINICAL DATA:  Patient status post fall.  Patient on Eliquis. EXAM: CT HEAD WITHOUT CONTRAST CT CERVICAL SPINE WITHOUT CONTRAST TECHNIQUE: Multidetector CT imaging of the head and cervical spine was performed following the standard protocol without intravenous contrast. Multiplanar CT image reconstructions of the cervical spine were also generated. COMPARISON:  Brain CT 01/23/2020 FINDINGS: CT HEAD FINDINGS Brain: No evidence of acute infarction, hemorrhage, hydrocephalus, extra-axial collection or mass lesion/mass effect. Ventricles and sulci are appropriate patient's age. Vascular:  Unremarkable Skull: Intact. Sinuses/Orbits: There is nonspecific opacification of the left mastoid air cells. Right mastoid air cells are well aerated. Paranasal sinuses are well aerated. Other: None. CT CERVICAL SPINE FINDINGS Alignment: Normal. Skull base and vertebrae: No acute fracture. No primary bone lesion or focal pathologic process. Soft tissues and spinal canal: No prevertebral fluid or swelling. No visible canal hematoma. Disc levels: No acute fracture. Degenerative disc disease most pronounced C5-6. Upper chest: Negative. Other: None IMPRESSION: 1. No acute intracranial process. 2. No acute cervical spine fracture. 3. Nonspecific opacification of the left mastoid air cells. Electronically Signed   By: Lovey Newcomer M.D.   On: 08/27/2020 12:12   DG Chest Portable 1 View  Result Date: 08/27/2020 CLINICAL DATA:  Fever, cough. EXAM: PORTABLE CHEST 1 VIEW COMPARISON:  July 18, 2020. FINDINGS: Low lung volumes. Streaky left basilar opacities. No visible  pleural effusions or pneumothorax. Mildly enlarged cardiac silhouette. Aortic atherosclerosis. No acute osseous abnormality. IMPRESSION: Streaky left basilar opacities, which could represent atelectasis, aspiration, and/or pneumonia. Dedicated PA and lateral radiographs could further evaluate if clinically indicated. Electronically Signed   By: Margaretha Sheffield MD   On: 08/27/2020 13:17    ____________________________________________   PROCEDURES and INTERVENTIONS  Procedure(s) performed (including Critical Care):  .1-3 Lead EKG Interpretation Performed by: Vladimir Crofts, MD Authorized by: Vladimir Crofts, MD     Interpretation: abnormal     ECG rate:  120   ECG rate assessment: tachycardic     Rhythm: atrial fibrillation     Ectopy: none     Conduction: normal   .Critical Care Performed by: Vladimir Crofts, MD Authorized by: Vladimir Crofts, MD   Critical care provider statement:    Critical care time (minutes):  45   Critical care was  necessary to treat or prevent imminent or life-threatening deterioration of the following conditions:  Cardiac failure   Critical care was time spent personally by me on the following activities:  Discussions with consultants, evaluation of patient's response to treatment, examination of patient, ordering and performing treatments and interventions, ordering and review of laboratory studies, ordering and review of radiographic studies, pulse oximetry, re-evaluation of patient's condition, obtaining history from patient or surrogate and review of old charts    Medications  potassium chloride SA (KLOR-CON) CR tablet 40 mEq (has no administration in time range)  potassium chloride 10 mEq in 100 mL IVPB (has no administration in time range)  diltiazem (CARDIZEM) 125 mg in dextrose 5% 125 mL (1 mg/mL) infusion (has no administration in time range)  diltiazem (CARDIZEM) injection 10 mg (10 mg Intravenous Given 08/27/20 1244)    ____________________________________________   MDM / ED COURSE   64 year old woman presents from home confused after multiple falls this morning, with evidence of A. fib with RVR necessitating diltiazem drip and medical admission.  Patient tachycardic to the 120s in A. fib, hemodynamically stable.  Exam with a pleasantly disoriented patient who is unable to provide any relevant history.  She has no evidence of significant traumatic pathology from her falls.  No evidence of neurovascular deficits or distress.  She has some evidence of volume overload with pitting edema to her lower extremities, faint bibasilar crackles and mild tachypnea.  Blood work with slight elevation of her BNP suggestive of acute heart failure.  Serum potassium also decreased, this was repleted orally and IV.  Patient responds well to IV bolus of 10 mg of diltiazem, but still remains tachycardic.  We will initiate a diltiazem drip and admit the patient to hospitalist medicine for further work-up and  management.  Clinical Course as of 08/27/20 1337  Sat Aug 27, 2020  1218 USIV placed by me [DS]  [place], husband.  No response.  Left hipaa compliant voicemail requesting call back [DS]  1330 Reassessed.  Slightly improved rates the low 100s after diltiazem bolus but still remains tachycardic.  We will start diltiazem drip.  Awaiting Covid swab. [DS]    Clinical Course User Index [DS] Vladimir Crofts, MD    ____________________________________________   FINAL CLINICAL IMPRESSION(S) / ED DIAGNOSES  Final diagnoses:  Fall, initial encounter  Atrial fibrillation with RVR [facility])     ED Discharge Orders    None       Jovanne Riggenbach Tamala Julian   Note:  This document was prepared using Dragon voice recognition software and may include unintentional dictation errors.  Vladimir Crofts, MD 08/27/20 1339

## 2020-08-28 ENCOUNTER — Inpatient Hospital Stay: Payer: Medicare HMO

## 2020-08-28 DIAGNOSIS — K3184 Gastroparesis: Secondary | ICD-10-CM | POA: Diagnosis present

## 2020-08-28 DIAGNOSIS — R9431 Abnormal electrocardiogram [ECG] [EKG]: Secondary | ICD-10-CM | POA: Diagnosis not present

## 2020-08-28 DIAGNOSIS — E876 Hypokalemia: Secondary | ICD-10-CM | POA: Diagnosis present

## 2020-08-28 DIAGNOSIS — I480 Paroxysmal atrial fibrillation: Secondary | ICD-10-CM | POA: Diagnosis not present

## 2020-08-28 DIAGNOSIS — F32A Depression, unspecified: Secondary | ICD-10-CM | POA: Diagnosis present

## 2020-08-28 DIAGNOSIS — E785 Hyperlipidemia, unspecified: Secondary | ICD-10-CM

## 2020-08-28 DIAGNOSIS — R4182 Altered mental status, unspecified: Secondary | ICD-10-CM | POA: Diagnosis not present

## 2020-08-28 DIAGNOSIS — E1122 Type 2 diabetes mellitus with diabetic chronic kidney disease: Secondary | ICD-10-CM | POA: Diagnosis present

## 2020-08-28 DIAGNOSIS — M797 Fibromyalgia: Secondary | ICD-10-CM | POA: Diagnosis present

## 2020-08-28 DIAGNOSIS — E162 Hypoglycemia, unspecified: Secondary | ICD-10-CM

## 2020-08-28 DIAGNOSIS — I10 Essential (primary) hypertension: Secondary | ICD-10-CM | POA: Diagnosis not present

## 2020-08-28 DIAGNOSIS — N1831 Chronic kidney disease, stage 3a: Secondary | ICD-10-CM

## 2020-08-28 DIAGNOSIS — R413 Other amnesia: Secondary | ICD-10-CM | POA: Diagnosis not present

## 2020-08-28 DIAGNOSIS — I129 Hypertensive chronic kidney disease with stage 1 through stage 4 chronic kidney disease, or unspecified chronic kidney disease: Secondary | ICD-10-CM | POA: Diagnosis not present

## 2020-08-28 DIAGNOSIS — Z20822 Contact with and (suspected) exposure to covid-19: Secondary | ICD-10-CM | POA: Diagnosis not present

## 2020-08-28 DIAGNOSIS — I4891 Unspecified atrial fibrillation: Secondary | ICD-10-CM | POA: Diagnosis not present

## 2020-08-28 DIAGNOSIS — K449 Diaphragmatic hernia without obstruction or gangrene: Secondary | ICD-10-CM | POA: Diagnosis present

## 2020-08-28 DIAGNOSIS — Y92019 Unspecified place in single-family (private) house as the place of occurrence of the external cause: Secondary | ICD-10-CM | POA: Diagnosis not present

## 2020-08-28 DIAGNOSIS — D649 Anemia, unspecified: Secondary | ICD-10-CM

## 2020-08-28 DIAGNOSIS — E782 Mixed hyperlipidemia: Secondary | ICD-10-CM | POA: Diagnosis present

## 2020-08-28 DIAGNOSIS — E1149 Type 2 diabetes mellitus with other diabetic neurological complication: Secondary | ICD-10-CM | POA: Diagnosis present

## 2020-08-28 DIAGNOSIS — E11649 Type 2 diabetes mellitus with hypoglycemia without coma: Secondary | ICD-10-CM | POA: Diagnosis present

## 2020-08-28 DIAGNOSIS — W1830XA Fall on same level, unspecified, initial encounter: Secondary | ICD-10-CM | POA: Diagnosis not present

## 2020-08-28 DIAGNOSIS — R55 Syncope and collapse: Secondary | ICD-10-CM | POA: Diagnosis not present

## 2020-08-28 DIAGNOSIS — I951 Orthostatic hypotension: Secondary | ICD-10-CM | POA: Diagnosis not present

## 2020-08-28 DIAGNOSIS — G894 Chronic pain syndrome: Secondary | ICD-10-CM | POA: Diagnosis present

## 2020-08-28 DIAGNOSIS — K7460 Unspecified cirrhosis of liver: Secondary | ICD-10-CM | POA: Diagnosis present

## 2020-08-28 DIAGNOSIS — N390 Urinary tract infection, site not specified: Secondary | ICD-10-CM | POA: Diagnosis not present

## 2020-08-28 DIAGNOSIS — D631 Anemia in chronic kidney disease: Secondary | ICD-10-CM | POA: Diagnosis present

## 2020-08-28 DIAGNOSIS — R9082 White matter disease, unspecified: Secondary | ICD-10-CM | POA: Diagnosis not present

## 2020-08-28 DIAGNOSIS — K7581 Nonalcoholic steatohepatitis (NASH): Secondary | ICD-10-CM | POA: Diagnosis present

## 2020-08-28 DIAGNOSIS — I6782 Cerebral ischemia: Secondary | ICD-10-CM | POA: Diagnosis not present

## 2020-08-28 DIAGNOSIS — H748X2 Other specified disorders of left middle ear and mastoid: Secondary | ICD-10-CM | POA: Diagnosis not present

## 2020-08-28 DIAGNOSIS — E1143 Type 2 diabetes mellitus with diabetic autonomic (poly)neuropathy: Secondary | ICD-10-CM | POA: Diagnosis present

## 2020-08-28 DIAGNOSIS — S0990XA Unspecified injury of head, initial encounter: Secondary | ICD-10-CM | POA: Diagnosis present

## 2020-08-28 DIAGNOSIS — M5136 Other intervertebral disc degeneration, lumbar region: Secondary | ICD-10-CM | POA: Diagnosis present

## 2020-08-28 DIAGNOSIS — K219 Gastro-esophageal reflux disease without esophagitis: Secondary | ICD-10-CM | POA: Diagnosis not present

## 2020-08-28 LAB — CBC
HCT: 31.3 % — ABNORMAL LOW (ref 36.0–46.0)
Hemoglobin: 9.5 g/dL — ABNORMAL LOW (ref 12.0–15.0)
MCH: 26.5 pg (ref 26.0–34.0)
MCHC: 30.4 g/dL (ref 30.0–36.0)
MCV: 87.2 fL (ref 80.0–100.0)
Platelets: 152 10*3/uL (ref 150–400)
RBC: 3.59 MIL/uL — ABNORMAL LOW (ref 3.87–5.11)
RDW: 18.5 % — ABNORMAL HIGH (ref 11.5–15.5)
WBC: 5.4 10*3/uL (ref 4.0–10.5)
nRBC: 0 % (ref 0.0–0.2)

## 2020-08-28 LAB — COMPREHENSIVE METABOLIC PANEL
ALT: 27 U/L (ref 0–44)
AST: 38 U/L (ref 15–41)
Albumin: 3.1 g/dL — ABNORMAL LOW (ref 3.5–5.0)
Alkaline Phosphatase: 129 U/L — ABNORMAL HIGH (ref 38–126)
Anion gap: 7 (ref 5–15)
BUN: 16 mg/dL (ref 8–23)
CO2: 23 mmol/L (ref 22–32)
Calcium: 8.1 mg/dL — ABNORMAL LOW (ref 8.9–10.3)
Chloride: 104 mmol/L (ref 98–111)
Creatinine, Ser: 1.4 mg/dL — ABNORMAL HIGH (ref 0.44–1.00)
GFR, Estimated: 41 mL/min — ABNORMAL LOW (ref >60)
Glucose, Bld: 301 mg/dL — ABNORMAL HIGH (ref 70–99)
Potassium: 3.9 mmol/L (ref 3.5–5.1)
Sodium: 134 mmol/L — ABNORMAL LOW (ref 135–145)
Total Bilirubin: 0.9 mg/dL (ref 0.3–1.2)
Total Protein: 6.7 g/dL (ref 6.5–8.1)

## 2020-08-28 LAB — HEMOGLOBIN A1C
Hgb A1c MFr Bld: 10 % — ABNORMAL HIGH (ref 4.8–5.6)
Mean Plasma Glucose: 240.3 mg/dL

## 2020-08-28 LAB — GLUCOSE, CAPILLARY
Glucose-Capillary: 128 mg/dL — ABNORMAL HIGH (ref 70–99)
Glucose-Capillary: 131 mg/dL — ABNORMAL HIGH (ref 70–99)
Glucose-Capillary: 194 mg/dL — ABNORMAL HIGH (ref 70–99)
Glucose-Capillary: 245 mg/dL — ABNORMAL HIGH (ref 70–99)
Glucose-Capillary: 250 mg/dL — ABNORMAL HIGH (ref 70–99)
Glucose-Capillary: 307 mg/dL — ABNORMAL HIGH (ref 70–99)

## 2020-08-28 LAB — MAGNESIUM: Magnesium: 2 mg/dL (ref 1.7–2.4)

## 2020-08-28 LAB — FIBRIN DERIVATIVES D-DIMER (ARMC ONLY): Fibrin derivatives D-dimer (ARMC): 628.09 ng/mL (FEU) — ABNORMAL HIGH (ref 0.00–499.00)

## 2020-08-28 MED ORDER — INSULIN ASPART 100 UNIT/ML ~~LOC~~ SOLN: 10.0000 [IU] | Freq: Once | SUBCUTANEOUS | Status: DC

## 2020-08-28 MED ORDER — ENOXAPARIN SODIUM 40 MG/0.4ML ~~LOC~~ SOLN
40.0000 mg | SUBCUTANEOUS | Status: DC
  Administered 2020-08-28 – 2020-08-30 (×3): 40 mg via SUBCUTANEOUS
  Filled 2020-08-28 (×3): qty 0.4

## 2020-08-28 MED ORDER — SODIUM CHLORIDE 0.9 % IV BOLUS
1000.0000 mL | Freq: Once | INTRAVENOUS | Status: AC
  Administered 2020-08-28: 16:00:00 1000 mL via INTRAVENOUS

## 2020-08-28 NOTE — Progress Notes (Signed)
PT Cancellation Note  Patient Details Name: [name] MRN: [id number] DOB: June 25, 1956   Cancelled Treatment:    Reason Eval/Treat Not Completed: Medical issues which prohibited therapy.  Orthostatics were positive when OT saw pt, will retry at another time to see if pt is more controlled with mobility.   Ramond Dial 08/28/2020, 11:07 AM   Mee Hives, PT MS Acute Rehab Dept. Number: [place] and [place]

## 2020-08-28 NOTE — Progress Notes (Addendum)
PROGRESS NOTE    [name]  YPP:[id number] DOB: 1955-10-29 DOA: 08/27/2020 PCP: [name], NP   Brief Narrative: [name] is a 64 y.o. female with a history of allergies, anxiety, depression, cirrhosis secondary to steatohepatitis, uterine cancer, diverticulosis, gastroparesis, GERD, pancreatitis, hypertension, hypothyroidism. Patient presented secondary to multiple falls with syncope in addition to altered mental status. Admitted for syncope workup.   Assessment & Plan:   Principal Problem:   Atrial fibrillation with RVR (HCC) Active Problems:   GERD   Essential hypertension   Hypokalemia   Prolonged QT interval   Normochromic normocytic anemia   Type 2 diabetes mellitus with neurologic complication (HCC)   Hyperlipidemia   Hypoglycemia   CKD (chronic kidney disease), stage III (HCC)   Syncope Associated falls. Three episodes per patient. She suffered a head injury with negative CT head. Symptoms concerning for orthostatic hypotension. Recent Transthoracic Echocardiogram from 11/8 was significant for normal EF in addition to no stenosis of aortic valve. Associated incontinence with one episode of syncope. -Repeat EKG today -Orthostatic vitals -PT/OT eval -Telemetry  Paroxysmal atrial fibrillation with RVR Patient is on metoprolol as an outpatient. Episode of RVR on admission which has resolved after one dose of diltiazem IV. Home metoprolol continued. Patient is not currently on anticoagulation as an outpatient although she has a remote history of anticoagulation with Eliquis. -Continue Diltiazem and metoprolol -Telemetry  Memory  Impairment Unsure of etiology but seems to be an aspect of aphasia . Attempted to contact husband but was unable to contact. Patient's recollection of events appears to be accurate but is unable to remember her name. CT head unremarkable.  -MRI brain  Prolonged QTc -Telemetry  Hypoglycemia Resolved with D50 and food.  Unsure of etiology.  Diabetes mellitus, type 2 Patient is on Levemir 30 units twice daily as an outpatient. -Continue Levemir 15 units BID and SSI  GERD -Continue Protonix  Essential hypertension Patient is on metoprolol as an outpatient.  Chronic anemia Stable.  Hyperlipidemia Patient is on Lipitor 40 mg daily as an outpatient. -Continue Lipitor  CKD stage IIIb Baseline creatinine of about 1.4. Stable.  Depression -Continue Zoloft 50 mg daily  History of hypothyroidism Not on medication management. TSH has been normal.  Obesity Body mass index is 30.27 kg/m.   DVT prophylaxis: Lovenox Code Status:   Code Status: Full Code Family Communication: Husband on telephone but no reply Disposition Plan: Discharge home vs SNF likely in 1-3 days pending syncope workup and PT/OT recommendations   Consultants:   None  Procedures:   None  Antimicrobials:  None    Subjective: No concerns this morning.  Objective: Vitals:   08/27/20 2035 08/28/20 0300 08/28/20 0506 08/28/20 0731  BP: (!) 155/77  131/79 127/87  Pulse: 96  73 76  Resp: 18   18  Temp: 97.9 F (36.6 C)  98.3 F (36.8 C) 97.7 F (36.5 C)  TempSrc: Oral  Oral Oral  SpO2: 100%  100% 100%  Weight:  77.5 kg    Height:        Intake/Output Summary (Last 24 hours) at 08/28/2020 1056 Last data filed at 08/28/2020 0945 Gross per 24 hour  Intake 1284.98 ml  Output 325 ml  Net 959.98 ml   Filed Weights   08/27/20 1108 08/28/20 0300  Weight: 61.2 kg 77.5 kg    Examination:  General exam: Appears calm and comfortable Respiratory system: Clear to auscultation. Respiratory effort normal. Cardiovascular system: S1 & S2  heard, RRR. No murmurs, rubs, gallops or clicks. Gastrointestinal system: Abdomen is nondistended, soft and nontender. No organomegaly or masses felt. Normal bowel sounds heard. Central nervous system: Alert and appears oriented but cannot tell me her name, her husbands name, where  she is. She can tell me why she came to the hospital but is unable to specifically name the type of building. No focal neurological deficits. Musculoskeletal: No edema. No calf tenderness Skin: No cyanosis. No rashes Psychiatry: Judgement and insight appear normal. Mood & affect appropriate.     Data Reviewed: I have personally reviewed following labs and imaging studies  CBC Lab Results  Component Value Date   WBC 5.4 08/28/2020   RBC 3.59 (L) 08/28/2020   HGB 9.5 (L) 08/28/2020   HCT 31.3 (L) 08/28/2020   MCV 87.2 08/28/2020   MCH 26.5 08/28/2020   PLT 152 08/28/2020   MCHC 30.4 08/28/2020   RDW 18.5 (H) 08/28/2020   LYMPHSABS 0.7 07/18/2020   MONOABS 0.7 07/18/2020   EOSABS 0.1 07/18/2020   BASOSABS 0.0 47/42/5956     Last metabolic panel Lab Results  Component Value Date   NA 134 (L) 08/28/2020   K 3.9 08/28/2020   CL 104 08/28/2020   CO2 23 08/28/2020   BUN 16 08/28/2020   CREATININE 1.40 (H) 08/28/2020   GLUCOSE 301 (H) 08/28/2020   GFRNONAA 41 (L) 08/28/2020   GFRAA 49 (L) 02/20/2020   CALCIUM 8.1 (L) 08/28/2020   PHOS 3.3 08/27/2020   PROT 6.7 08/28/2020   ALBUMIN 3.1 (L) 08/28/2020   BILITOT 0.9 08/28/2020   ALKPHOS 129 (H) 08/28/2020   AST 38 08/28/2020   ALT 27 08/28/2020   ANIONGAP 7 08/28/2020    CBG (last 3)  Recent Labs    08/28/20 0031 08/28/20 0157 08/28/20 0834  GLUCAP 307* 250* 194*     GFR: Estimated Creatinine Clearance: 38.9 mL/min (A) (by C-G formula based on SCr of 1.4 mg/dL (H)).  Coagulation Profile: No results for input(s): INR, PROTIME in the last 168 hours.  Recent Results (from the past 240 hour(s))  Resp Panel by RT-PCR (Flu A&B, Covid) Nasopharyngeal Swab     Status: None   Collection Time: 08/27/20  2:29 PM   Specimen: Nasopharyngeal Swab; Nasopharyngeal(NP) swabs in vial transport medium  Result Value Ref Range Status   SARS Coronavirus 2 by RT PCR NEGATIVE NEGATIVE Final    Comment: (NOTE) SARS-CoV-2 target  nucleic acids are NOT DETECTED.  The SARS-CoV-2 RNA is generally detectable in upper respiratory specimens during the acute phase of infection. The lowest concentration of SARS-CoV-2 viral copies this assay can detect is 138 copies/mL. A negative result does not preclude SARS-Cov-2 infection and should not be used as the sole basis for treatment or other patient management decisions. A negative result may occur with  improper specimen collection/handling, submission of specimen other than nasopharyngeal swab, presence of viral mutation(s) within the areas targeted by this assay, and inadequate number of viral copies(<138 copies/mL). A negative result must be combined with clinical observations, patient history, and epidemiological information. The expected result is Negative.  Fact Sheet for Patients:  EntrepreneurPulse.com.au  Fact Sheet for Healthcare Providers:  IncredibleEmployment.be  This test is no t yet approved or cleared by the [place] FDA and  has been authorized for detection and/or diagnosis of SARS-CoV-2 by FDA under an Emergency Use Authorization (EUA). This EUA will remain  in effect (meaning this test can be used) for the duration of the  COVID-19 declaration under Section 564(b)(1) of the Act, 21 U.S.C.section 360bbb-3(b)(1), unless the authorization is terminated  or revoked sooner.       Influenza A by PCR NEGATIVE NEGATIVE Final   Influenza B by PCR NEGATIVE NEGATIVE Final    Comment: (NOTE) The Xpert Xpress SARS-CoV-2/FLU/RSV plus assay is intended as an aid in the diagnosis of influenza from Nasopharyngeal swab specimens and should not be used as a sole basis for treatment. Nasal washings and aspirates are unacceptable for Xpert Xpress SARS-CoV-2/FLU/RSV testing.  Fact Sheet for Patients: EntrepreneurPulse.com.au  Fact Sheet for Healthcare  Providers: IncredibleEmployment.be  This test is not yet approved or cleared by the [place] FDA and has been authorized for detection and/or diagnosis of SARS-CoV-2 by FDA under an Emergency Use Authorization (EUA). This EUA will remain in effect (meaning this test can be used) for the duration of the COVID-19 declaration under Section 564(b)(1) of the Act, 21 U.S.C. section 360bbb-3(b)(1), unless the authorization is terminated or revoked.  Performed at [facility], [address], [place], [place] [address]         Radiology Studies: CT HEAD WO CONTRAST  Result Date: 08/27/2020 CLINICAL DATA:  Patient status post fall.  Patient on Eliquis. EXAM: CT HEAD WITHOUT CONTRAST CT CERVICAL SPINE WITHOUT CONTRAST TECHNIQUE: Multidetector CT imaging of the head and cervical spine was performed following the standard protocol without intravenous contrast. Multiplanar CT image reconstructions of the cervical spine were also generated. COMPARISON:  Brain CT 01/23/2020 FINDINGS: CT HEAD FINDINGS Brain: No evidence of acute infarction, hemorrhage, hydrocephalus, extra-axial collection or mass lesion/mass effect. Ventricles and sulci are appropriate patient's age. Vascular: Unremarkable Skull: Intact. Sinuses/Orbits: There is nonspecific opacification of the left mastoid air cells. Right mastoid air cells are well aerated. Paranasal sinuses are well aerated. Other: None. CT CERVICAL SPINE FINDINGS Alignment: Normal. Skull base and vertebrae: No acute fracture. No primary bone lesion or focal pathologic process. Soft tissues and spinal canal: No prevertebral fluid or swelling. No visible canal hematoma. Disc levels: No acute fracture. Degenerative disc disease most pronounced C5-6. Upper chest: Negative. Other: None IMPRESSION: 1. No acute intracranial process. 2. No acute cervical spine fracture. 3. Nonspecific opacification of the left mastoid air cells. Electronically  Signed   By: Lovey Newcomer M.D.   On: 08/27/2020 12:12   CT Cervical Spine Wo Contrast  Result Date: 08/27/2020 CLINICAL DATA:  Patient status post fall.  Patient on Eliquis. EXAM: CT HEAD WITHOUT CONTRAST CT CERVICAL SPINE WITHOUT CONTRAST TECHNIQUE: Multidetector CT imaging of the head and cervical spine was performed following the standard protocol without intravenous contrast. Multiplanar CT image reconstructions of the cervical spine were also generated. COMPARISON:  Brain CT 01/23/2020 FINDINGS: CT HEAD FINDINGS Brain: No evidence of acute infarction, hemorrhage, hydrocephalus, extra-axial collection or mass lesion/mass effect. Ventricles and sulci are appropriate patient's age. Vascular: Unremarkable Skull: Intact. Sinuses/Orbits: There is nonspecific opacification of the left mastoid air cells. Right mastoid air cells are well aerated. Paranasal sinuses are well aerated. Other: None. CT CERVICAL SPINE FINDINGS Alignment: Normal. Skull base and vertebrae: No acute fracture. No primary bone lesion or focal pathologic process. Soft tissues and spinal canal: No prevertebral fluid or swelling. No visible canal hematoma. Disc levels: No acute fracture. Degenerative disc disease most pronounced C5-6. Upper chest: Negative. Other: None IMPRESSION: 1. No acute intracranial process. 2. No acute cervical spine fracture. 3. Nonspecific opacification of the left mastoid air cells. Electronically Signed   By: Lovey Newcomer  M.D.   On: 08/27/2020 12:12   DG Chest Portable 1 View  Result Date: 08/27/2020 CLINICAL DATA:  Fever, cough. EXAM: PORTABLE CHEST 1 VIEW COMPARISON:  July 18, 2020. FINDINGS: Low lung volumes. Streaky left basilar opacities. No visible pleural effusions or pneumothorax. Mildly enlarged cardiac silhouette. Aortic atherosclerosis. No acute osseous abnormality. IMPRESSION: Streaky left basilar opacities, which could represent atelectasis, aspiration, and/or pneumonia. Dedicated PA and lateral  radiographs could further evaluate if clinically indicated. Electronically Signed   By: Margaretha Sheffield MD   On: 08/27/2020 13:17        Scheduled Meds: . atorvastatin  40 mg Oral QHS  . enoxaparin (LOVENOX) injection  40 mg Subcutaneous Q24H  . insulin aspart  0-15 Units Subcutaneous TID WC  . insulin detemir  15 Units Subcutaneous BID  . magnesium gluconate  500 mg Oral BID  . metoprolol tartrate  25 mg Oral BID  . pantoprazole  40 mg Oral Daily  . potassium chloride  40 mEq Oral Once  . sertraline  50 mg Oral Daily   Continuous Infusions: . sodium chloride Stopped (08/27/20 2247)  . cefTRIAXone (ROCEPHIN)  IV Stopped (08/27/20 2225)  . diltiazem (CARDIZEM) infusion Stopped (08/27/20 1548)  . magnesium sulfate bolus IVPB       LOS: 0 days     Cordelia Poche, MD [facility] 08/28/2020, 10:56 AM  If 7PM-7AM, please contact night-coverage [email or web address]

## 2020-08-28 NOTE — Evaluation (Signed)
Occupational Therapy Evaluation Patient Details Name: [name] MRN: [id number] DOB: 12-May-1956 Today's Date: 08/28/2020    History of Present Illness 64 y.o. female with medical history significant of seasonal allergies, anginal pain, anxiety, depression, liver cirrhosis due to steatohepatitis, uterine cancer, degenerative disc disease, diverticulosis/diverticulitis, gastroparesis, GERD/hiatal hernia, pancreatitis, hypertension, hypothyroidism due to amiodarone, history of pneumonia, history of urolithiasis, history of other nonhemorrhagic CVA with mild left-sided hemiparesis, type 1 diabetes, paroxysmal atrial fibrillation, sick sinus syndrome, orthostatic hypotension who is coming to the emergency department via EMS due to having AMS and history of at least 3 falls at home.   Clinical Impression   Pt was seen for OT evaluation this date. Prior to hospital admission, pt reports she was indep with LB dressing, taking seated sponge baths, ambulating while reaching out for furniture to hold onto, and was receiving assist from spouse for med mgt and getting Meals on Wheels M-F. Pt alert and pleasant, follows commands, but disoriented x4 (thought her name might be Santiago Glad but she wasn't sure). Per chart, and pt, pt lives with her spouse, adult daughter, and 25yo grandson. Pt endorses multiple falls, 3 happening only yesterday prior to admission. Currently pt demonstrates impairments as described below (See OT problem list) which functionally limit her ability to perform ADL/self-care tasks. Pt currently requires increased time for processing of information/questions, cues for safety, and delayed response time noted with poor short and longer term recall. Pt required supervision for bed mobility, and CGA for ADL transfers with cues for RW. Pt repositioned socks EOB with supervision for safety/balance. Fair static standing balance, poor dynamic standing balance.   Orthostatics taken during mobility (see  vitals section below for additional detail). Pt + orthostatic and symptomatic, with BP not improving with increased standing time. (initial supine 143/75, 33mn standing 83/33). Pt returned to supine and RN, NT, and PT notified. Pt inconsistent with verbalizing when she felt dizzy or not. Pt instructed in falls prevention, RW mgt, and bladder mgt strategies to improve safety and prevent falls. Wrote down recommendations for pt to support recall and carryover (Depends overnight with or without pad, washable chuck pads for bedding, pads during the day, consider getting a BP cuff). Pt would benefit from skilled OT services to address noted impairments and functional limitations (see below for any additional details) in order to maximize safety and independence while minimizing falls risk and caregiver burden. Upon hospital discharge, recommend STR to maximize pt safety and return to PLOF. Will continue to assess as pt progresses.      Follow Up Recommendations  SNF;Other (comment) (supervision for all OOB)    Equipment Recommendations  None recommended by OT    Recommendations for Other Services       Precautions / Restrictions Precautions Precautions: Fall;Other (comment) Precaution Comments: take orthstatics Restrictions Weight Bearing Restrictions: No      Mobility Bed Mobility Overal bed mobility: Needs Assistance Bed Mobility: Supine to Sit;Sit to Supine     Supine to sit: Supervision;HOB elevated Sit to supine: Supervision        Transfers Overall transfer level: Needs assistance Equipment used: Rolling walker (2 wheeled) Transfers: Sit to/from Stand Sit to Stand: Min guard         General transfer comment: cues for hand placement, as pt has tendency to reach out for counter instead of keeping hands on RW    Balance Overall balance assessment: Needs assistance Sitting-balance support: Single extremity supported;Feet supported Sitting balance-Leahy Scale: Fair  Standing balance-Leahy Scale: Fair Standing balance comment: initial slight LOB posteriorly but able to self correct without assist, poor balance with dynamic balance activities, fair static balance with UE support on RW                           ADL either performed or assessed with clinical judgement   ADL Overall ADL's : Needs assistance/impaired                                       General ADL Comments: CGA for completing LB ADL in sitting or standing with RW, cues for safety, unable to tolerate standing ADL for >~3-66mn 2/2 symptomatic positional hypotension     Vision Baseline Vision/History: Wears glasses Wears Glasses: At all times Patient Visual Report: No change from baseline       Perception     Praxis      Pertinent Vitals/Pain Pain Assessment: 0-10 Pain Score: 8  Pain Location: back and neck Pain Descriptors / Indicators: Aching Pain Intervention(s): Limited activity within patient's tolerance;Monitored during session;Premedicated before session;Repositioned     Hand Dominance Right   Extremity/Trunk Assessment Upper Extremity Assessment Upper Extremity Assessment: Generalized weakness;Difficult to assess due to impaired cognition   Lower Extremity Assessment Lower Extremity Assessment: Generalized weakness;Difficult to assess due to impaired cognition       Communication Communication Communication: HOH   Cognition Arousal/Alertness: Awake/alert Behavior During Therapy: WFL for tasks assessed/performed Overall Cognitive Status: No family/caregiver present to determine baseline cognitive functioning                                 General Comments: Pt alert, disoriented x4 (thought her name might be KSantiago Glad thought she was at a clinic, didn't know the month but knew it must be near Christmas time 2/2 movies on tv but still unable to verbalize December, and "came to see a doctor"), follows commands, memory  deficits noted, increased processing/problem solving time noted   General Comments  Orthostatics taken (see vitals section for detail), pt endorsed dizziness and [facility] not improve in standing (supine 143/75, standing 6 min 83/33). RN, NT, and PT notified.    Exercises Other Exercises Other Exercises: Pt instructed in falls prevention strategies and bladder mgt strategies   Shoulder Instructions      Home Living Family/patient expects to be discharged to:: Private residence Living Arrangements: Spouse/significant other;Children;Other relatives (spouse JJeneen Rinks adult daughter, and 455yograndson) Available Help at Discharge: Family (unclear if available 24/7; previous chart review says yes but pt reports no) Type of Home: Mobile home Home Access: Ramped entrance     Home Layout: One level     Bathroom Shower/Tub: Tub/shower unit;Curtain   BBiochemist, clinical Standard     Home Equipment: WEnvironmental consultant- 2 wheels;Cane - quad;Cane - single point;Bedside commode;Walker - 4 wheels   Additional Comments: question accuracy of information provided given discrepancies between pt report and chart review      Prior Functioning/Environment Level of Independence: Needs assistance  Gait / Transfers Assistance Needed: Pt reports not using AD consistently in the home and just "reaches out" for things to hold on to; per previous chart review approx [id number], pt was using 2WW for short household distances ADL's / Homemaking Assistance Needed: Pt reports indep  with dressing, seated sponge bath, and uses BSC by the bed at night for toileting needs; pt endorses needing help for compression stockings she uses, pt reports Meals on Wheels M-F, spouse assists with medications, assist for transportation; pt endorses difficulty with bladder mgt and often needs to rush Communication / Swallowing Assistance Needed: minimal stuttering noted infrequently Comments: Pt endorses multiple falls, 3 just yesterday before  coming to the ED; pt reports she frequently gets dizzy when she sits up or stands up        OT Problem List: Decreased strength;Cardiopulmonary status limiting activity;Pain;Decreased cognition;Decreased safety awareness;Decreased activity tolerance;Impaired balance (sitting and/or standing);Decreased knowledge of use of DME or AE      OT Treatment/Interventions: Self-care/ADL training;Therapeutic exercise;Therapeutic activities;Cognitive remediation/compensation;Balance training;Patient/family education;DME and/or AE instruction    OT Goals(Current goals can be found in the care plan section) Acute Rehab OT Goals Patient Stated Goal: to get better OT Goal Formulation: With patient Time For Goal Achievement: 09/11/20 Potential to Achieve Goals: Good ADL Goals Pt Will Perform Lower Body Dressing: with supervision;sit to/from stand;with set-up Pt Will Transfer to Toilet: with supervision;ambulating;bedside commode (LRAD for amb) Additional ADL Goal #1: Pt/family will verbalize plan to implement at least 1 learned falls prevention strategy to [facility] safety. Additional ADL Goal #2: Pt/family will verbalize plan to implement at least 1 learned bladder mgt strategy to [facility] safety.  OT Frequency: Min 2X/week   Barriers to D/C:            Co-evaluation              AM-PAC OT "6 Clicks" Daily Activity     Outcome Measure Help from another person eating meals?: None Help from another person taking care of personal grooming?: A Little Help from another person toileting, which includes using toliet, bedpan, or urinal?: A Little Help from another person bathing (including washing, rinsing, drying)?: A Little Help from another person to put on and taking off regular upper body clothing?: None Help from another person to put on and taking off regular lower body clothing?: A Little 6 Click Score: 20   End of Session Equipment Utilized During Treatment: Gait belt;Rolling  walker Nurse Communication: Other (comment);Mobility status (orthostatic)  Activity Tolerance: Treatment limited secondary to medical complications (Comment) Patient left: in bed;with call bell/phone within reach;with bed alarm set  OT Visit Diagnosis: Other abnormalities of gait and mobility (R26.89);Repeated falls (R29.6);Muscle weakness (generalized) (M62.81)                Time: 6160-7371 OT Time Calculation (min): 49 min Charges:  OT General Charges $OT Visit: 1 Visit OT Evaluation $OT Eval Moderate Complexity: 1 Mod OT Treatments $Self Care/Home Management : 23-37 mins $Therapeutic Activity: 8-22 mins  Jeni Salles, MPH, MS, OTR/L ascom [phone] 08/28/20, 1:30 PM

## 2020-08-28 NOTE — Progress Notes (Signed)
PHARMACIST - PHYSICIAN COMMUNICATION  CONCERNING:  Enoxaparin (Lovenox) for DVT Prophylaxis    RECOMMENDATION: Patient was prescribed enoxaprin 33m q24 hours for VTE prophylaxis.   Filed Weights   08/27/20 1108 08/28/20 0300  Weight: 61.2 kg (135 lb) 77.5 kg (170 lb 14.4 oz)    Body mass index is 30.27 kg/m.  Estimated Creatinine Clearance: 38.9 mL/min (A) (by C-G formula based on SCr of 1.4 mg/dL (H)).  Patient is candidate for enoxaparin 40 mg every 24 hours based on CrCl >389mmin or Weight >45kg  DESCRIPTION: Pharmacy [facility]  Patient is now receiving enoxaparin 40 mg every 24 hours    RoDallie PilesPharmD Clinical Pharmacist  08/28/2020 10:53 AM

## 2020-08-28 NOTE — Evaluation (Signed)
Physical Therapy Evaluation Patient Details Name: [name] MRN: [id number] DOB: 1955/09/26 Today's Date: 08/28/2020   History of Present Illness  64 y.o. female with medical history significant of seasonal allergies, anginal pain, anxiety, depression, liver cirrhosis due to steatohepatitis, uterine cancer, degenerative disc disease, diverticulosis/diverticulitis, gastroparesis, GERD/hiatal hernia, pancreatitis, hypertension, hypothyroidism due to amiodarone, history of pneumonia, history of urolithiasis, history of other nonhemorrhagic CVA with mild left-sided hemiparesis, type 1 diabetes, paroxysmal atrial fibrillation, sick sinus syndrome, orthostatic hypotension who is coming to the emergency department via EMS due to having AMS and history of at least 3 falls at home.  Clinical Impression  Pt was seen for mobiity on RW to stand with BP readings to ck orthostatics:  Supine 136/70, sitting 136/70, standing144/110, and supine after 104/90.  Pt had unusual reaction to standing with a sudden backward roll of her head, then to L side.  Sat her down on the bed and was immediately better.  Will monitor this change in tolerance for standing and progress with goals of PT acutely.    Follow Up Recommendations SNF    Equipment Recommendations  Rolling walker with 5" wheels    Recommendations for Other Services       Precautions / Restrictions Precautions Precautions: Fall;Other (comment) Precaution Comments: take orthstatics Restrictions Weight Bearing Restrictions: No      Mobility  Bed Mobility Overal bed mobility: Needs Assistance Bed Mobility: Supine to Sit;Sit to Supine     Supine to sit: Min assist Sit to supine: Min assist        Transfers Overall transfer level: Needs assistance Equipment used: Rolling walker (2 wheeled) Transfers: Sit to/from Stand Sit to Stand: Min assist         General transfer comment: reminders for hand placement to stand and  sit  Ambulation/Gait             General Gait Details: unable  Stairs            Wheelchair Mobility    Modified Rankin (Stroke Patients Only)       Balance Overall balance assessment: Needs assistance Sitting-balance support: Feet supported;Bilateral upper extremity supported Sitting balance-Leahy Scale: Good Sitting balance - Comments: '   Standing balance support: Bilateral upper extremity supported;During functional activity Standing balance-Leahy Scale: Good                               Pertinent Vitals/Pain Pain Assessment: No/denies pain    Home Living Family/patient expects to be discharged to:: Private residence Living Arrangements: Spouse/significant other;Children;Other relatives Available Help at Discharge: Family Type of Home: Mobile home Home Access: Ramped entrance     Home Layout: One level Home Equipment: [place] - 2 wheels;Cane - quad;Cane - single point;Bedside commode;Walker - 4 wheels      Prior Function Level of Independence: Needs assistance   Gait / Transfers Assistance Needed: conflicting but used RW  ADL's / Homemaking Assistance Needed: independent dressing and bathing        Hand Dominance   Dominant Hand: Right    Extremity/Trunk Assessment   Upper Extremity Assessment Upper Extremity Assessment: Overall WFL for tasks assessed    Lower Extremity Assessment Lower Extremity Assessment: Generalized weakness    Cervical / Trunk Assessment Cervical / Trunk Assessment:  (mild kyphosis)  Communication   Communication: HOH  Cognition Arousal/Alertness: Awake/alert Behavior During Therapy: WFL for tasks assessed/performed Overall Cognitive Status: No family/caregiver present to determine  baseline cognitive functioning                                 General Comments: pt was noted to have light headed feelings standing, but is demonstrating unusual response with a sudden backward list of  her head and then to L side      General Comments General comments (skin integrity, edema, etc.): pt is up to stand with BP values on sitting and supine being normal, but standing value did not read    Exercises     Assessment/Plan    PT Assessment Patient needs continued PT services  PT Problem List Decreased strength;Decreased range of motion;Decreased activity tolerance;Decreased balance;Decreased mobility;Decreased coordination;Decreased knowledge of use of DME;Decreased safety awareness;Cardiopulmonary status limiting activity       PT Treatment Interventions DME instruction;Gait training;Functional mobility training;Therapeutic activities;Therapeutic exercise;Balance training;Neuromuscular re-education;Patient/family education    PT Goals (Current goals can be found in the Care Plan section)  Acute Rehab PT Goals Patient Stated Goal: to get better PT Goal Formulation: With patient Time For Goal Achievement: 09/11/20 Potential to Achieve Goals: Good    Frequency Min 2X/week   Barriers to discharge Decreased caregiver support;Inaccessible home environment home with family but is unstable and a high fall risk    Co-evaluation               AM-PAC PT "6 Clicks" Mobility  Outcome Measure Help needed turning from your back to your side while in a flat bed without using bedrails?: A Little Help needed moving from lying on your back to sitting on the side of a flat bed without using bedrails?: A Little Help needed moving to and from a bed to a chair (including a wheelchair)?: A Little Help needed standing up from a chair using your arms (e.g., wheelchair or bedside chair)?: A Little Help needed to walk in hospital room?: A Lot Help needed climbing 3-5 steps with a railing? : A Lot 6 Click Score: 16    End of Session Equipment Utilized During Treatment: Gait belt Activity Tolerance: Treatment limited secondary to medical complications (Comment) Patient left: in  bed;with call bell/phone within reach;with bed alarm set Nurse Communication: Mobility status PT Visit Diagnosis: Unsteadiness on feet (R26.81);Muscle weakness (generalized) (M62.81)    Time: 1430-1450 PT Time Calculation (min) (ACUTE ONLY): 20 min   Charges:   PT Evaluation $PT Eval Moderate Complexity: 1 Mod         Ramond Dial 08/28/2020, 9:08 PM  Mee Hives, PT MS Acute Rehab Dept. Number: [place] and [place]

## 2020-08-29 DIAGNOSIS — R4182 Altered mental status, unspecified: Secondary | ICD-10-CM

## 2020-08-29 LAB — GLUCOSE, CAPILLARY
Glucose-Capillary: 157 mg/dL — ABNORMAL HIGH (ref 70–99)
Glucose-Capillary: 169 mg/dL — ABNORMAL HIGH (ref 70–99)
Glucose-Capillary: 169 mg/dL — ABNORMAL HIGH (ref 70–99)
Glucose-Capillary: 176 mg/dL — ABNORMAL HIGH (ref 70–99)
Glucose-Capillary: 326 mg/dL — ABNORMAL HIGH (ref 70–99)
Glucose-Capillary: 89 mg/dL (ref 70–99)

## 2020-08-29 LAB — TSH: TSH: 7.895 u[IU]/mL — ABNORMAL HIGH (ref 0.350–4.500)

## 2020-08-29 LAB — VITAMIN B12: Vitamin B-12: 413 pg/mL (ref 180–914)

## 2020-08-29 MED ORDER — INSULIN DETEMIR 100 UNIT/ML ~~LOC~~ SOLN
20.0000 [IU] | Freq: Two times a day (BID) | SUBCUTANEOUS | Status: DC
  Administered 2020-08-29 – 2020-08-30 (×2): 20 [IU] via SUBCUTANEOUS
  Filled 2020-08-29 (×3): qty 0.2

## 2020-08-29 MED ORDER — MIDODRINE HCL 5 MG PO TABS
2.5000 mg | ORAL_TABLET | Freq: Three times a day (TID) | ORAL | Status: DC
  Administered 2020-08-29 – 2020-08-31 (×6): 2.5 mg via ORAL
  Filled 2020-08-29 (×4): qty 0.5
  Filled 2020-08-29: qty 1
  Filled 2020-08-29 (×2): qty 0.5

## 2020-08-29 NOTE — Progress Notes (Signed)
PROGRESS NOTE    [name]  NOI:[id number] DOB: Jan 31, 1956 DOA: 08/27/2020 PCP: [name], NP   Brief Narrative: [name] is a 64 y.o. female with a history of allergies, anxiety, depression, cirrhosis secondary to steatohepatitis, uterine cancer, diverticulosis, gastroparesis, GERD, pancreatitis, hypertension, hypothyroidism. Patient presented secondary to multiple falls with syncope in addition to altered mental status. Admitted for syncope workup.   Assessment & Plan:   Principal Problem:   Atrial fibrillation with RVR (HCC) Active Problems:   GERD   Essential hypertension   Hypokalemia   Prolonged QT interval   Normochromic normocytic anemia   Type 2 diabetes mellitus with neurologic complication (HCC)   Hyperlipidemia   Hypoglycemia   CKD (chronic kidney disease), stage III (HCC)   Syncope Associated falls. Three episodes per patient. She suffered a head injury with negative CT head. Symptoms concerning for orthostatic hypotension. Recent Transthoracic Echocardiogram from 11/8 was significant for normal EF in addition to no stenosis of aortic valve. Associated incontinence with one episode of syncope. -Orthostatic vitals BID -Telemetry  Paroxysmal atrial fibrillation with RVR Patient is on metoprolol as an outpatient. Episode of RVR on admission which has resolved after one dose of diltiazem IV. Home metoprolol continued. Patient is not currently on anticoagulation as an outpatient although she has a remote history of anticoagulation with Eliquis. -Continue Diltiazem and metoprolol -Telemetry  Memory  Impairment Unsure of etiology but seems to be an aspect of aphasia. Patient did have hypoglycemia prior to admission. Patient's recollection of events appears to be accurate but is unable to remember her name, naming certain objects, her husbands name. CT head unremarkable. MRI brain unremarkable for acute process. Discussed with husband and at most he  has noticed that she forgets street names and their home address. -Neurology recommendations  Prolonged QTc -Telemetry  Hypoglycemia Resolved with D50 and food. Unsure of etiology but possibly related to insulin dosing. Resolved.  Diabetes mellitus, type 2 Patient is on Levemir 30 units twice daily as an outpatient. Reduced to Levemir 15 units BID. Blood sugar significantly uncontrolled this morning. -Increase to Levemir 20 units BID and continue SSI  GERD -Continue Protonix  Essential hypertension Patient is on metoprolol as an outpatient.  Chronic anemia Stable.  Hyperlipidemia Patient is on Lipitor 40 mg daily as an outpatient. -Continue Lipitor  CKD stage IIIb Baseline creatinine of about 1.4. Stable.  Depression -Continue Zoloft 50 mg daily  History of hypothyroidism Not on medication management. TSH has been normal.  Obesity Body mass index is 30.46 kg/m.   DVT prophylaxis: Lovenox Code Status:   Code Status: Full Code Family Communication: Husband on telephone (7 minutes) Disposition Plan: Discharge home vs SNF likely in 1-2 days pending syncope workup and neurology recommendations   Consultants:   Neurology  Procedures:   None  Antimicrobials:  None    Subjective: No issues this morning.  Objective: Vitals:   08/28/20 2029 08/29/20 0419 08/29/20 0549 08/29/20 0746  BP: (!) 162/75  130/65 128/72  Pulse: 82  81 87  Resp:    19  Temp: (!) 97.4 F (36.3 C)  98.5 F (36.9 C) 98.2 F (36.8 C)  TempSrc: Oral  Oral Oral  SpO2: 100%  95% 97%  Weight:  78 kg    Height:        Intake/Output Summary (Last 24 hours) at 08/29/2020 0925 Last data filed at 08/29/2020 0701 Gross per 24 hour  Intake 806.43 ml  Output 1215 ml  Net -408.57 ml   Filed Weights   08/27/20 1108 08/28/20 0300 08/29/20 0419  Weight: 61.2 kg 77.5 kg 78 kg    Examination:  General exam: Appears calm and comfortable Respiratory system: Clear to auscultation.  Respiratory effort normal. Cardiovascular system: S1 & S2 heard, RRR. No murmurs, rubs, gallops or clicks. Gastrointestinal system: Abdomen is nondistended, soft and nontender. No organomegaly or masses felt. Normal bowel sounds heard. Central nervous system: Alert. Cannot tell me her name. States she is in a clinic and when informed she is in the hospital asked "what is the difference." Could not tell me her husbands name and referred to him as "a man." When asked what people drive in, she replied "horse and buggy." No focal neurological deficits. Musculoskeletal: No edema. No calf tenderness Skin: No cyanosis. No rashes Psychiatry: Judgement and insight appear impaired. Mood & affect appropriate.     Data Reviewed: I have personally reviewed following labs and imaging studies  CBC Lab Results  Component Value Date   WBC 5.4 08/28/2020   RBC 3.59 (L) 08/28/2020   HGB 9.5 (L) 08/28/2020   HCT 31.3 (L) 08/28/2020   MCV 87.2 08/28/2020   MCH 26.5 08/28/2020   PLT 152 08/28/2020   MCHC 30.4 08/28/2020   RDW 18.5 (H) 08/28/2020   LYMPHSABS 0.7 07/18/2020   MONOABS 0.7 07/18/2020   EOSABS 0.1 07/18/2020   BASOSABS 0.0 62/86/3817     Last metabolic panel Lab Results  Component Value Date   NA 134 (L) 08/28/2020   K 3.9 08/28/2020   CL 104 08/28/2020   CO2 23 08/28/2020   BUN 16 08/28/2020   CREATININE 1.40 (H) 08/28/2020   GLUCOSE 301 (H) 08/28/2020   GFRNONAA 41 (L) 08/28/2020   GFRAA 49 (L) 02/20/2020   CALCIUM 8.1 (L) 08/28/2020   PHOS 3.3 08/27/2020   PROT 6.7 08/28/2020   ALBUMIN 3.1 (L) 08/28/2020   BILITOT 0.9 08/28/2020   ALKPHOS 129 (H) 08/28/2020   AST 38 08/28/2020   ALT 27 08/28/2020   ANIONGAP 7 08/28/2020    CBG (last 3)  Recent Labs    08/28/20 2030 08/28/20 2358 08/29/20 0748  GLUCAP 128* 169* 326*     GFR: Estimated Creatinine Clearance: 39.1 mL/min (A) (by C-G formula based on SCr of 1.4 mg/dL (H)).  Coagulation Profile: No results for  input(s): INR, PROTIME in the last 168 hours.  Recent Results (from the past 240 hour(s))  Resp Panel by RT-PCR (Flu A&B, Covid) Nasopharyngeal Swab     Status: None   Collection Time: 08/27/20  2:29 PM   Specimen: Nasopharyngeal Swab; Nasopharyngeal(NP) swabs in vial transport medium  Result Value Ref Range Status   SARS Coronavirus 2 by RT PCR NEGATIVE NEGATIVE Final    Comment: (NOTE) SARS-CoV-2 target nucleic acids are NOT DETECTED.  The SARS-CoV-2 RNA is generally detectable in upper respiratory specimens during the acute phase of infection. The lowest concentration of SARS-CoV-2 viral copies this assay can detect is 138 copies/mL. A negative result does not preclude SARS-Cov-2 infection and should not be used as the sole basis for treatment or other patient management decisions. A negative result may occur with  improper specimen collection/handling, submission of specimen other than nasopharyngeal swab, presence of viral mutation(s) within the areas targeted by this assay, and inadequate number of viral copies(<138 copies/mL). A negative result must be combined with clinical observations, patient history, and epidemiological information. The expected result is Negative.  Fact Sheet for Patients:  EntrepreneurPulse.com.au  Fact Sheet for Healthcare Providers:  IncredibleEmployment.be  This test is no t yet approved or cleared by the [place] FDA and  has been authorized for detection and/or diagnosis of SARS-CoV-2 by FDA under an Emergency Use Authorization (EUA). This EUA will remain  in effect (meaning this test can be used) for the duration of the COVID-19 declaration under Section 564(b)(1) of the Act, 21 U.S.C.section 360bbb-3(b)(1), unless the authorization is terminated  or revoked sooner.       Influenza A by PCR NEGATIVE NEGATIVE Final   Influenza B by PCR NEGATIVE NEGATIVE Final    Comment: (NOTE) The Xpert Xpress  SARS-CoV-2/FLU/RSV plus assay is intended as an aid in the diagnosis of influenza from Nasopharyngeal swab specimens and should not be used as a sole basis for treatment. Nasal washings and aspirates are unacceptable for Xpert Xpress SARS-CoV-2/FLU/RSV testing.  Fact Sheet for Patients: EntrepreneurPulse.com.au  Fact Sheet for Healthcare Providers: IncredibleEmployment.be  This test is not yet approved or cleared by the [place] FDA and has been authorized for detection and/or diagnosis of SARS-CoV-2 by FDA under an Emergency Use Authorization (EUA). This EUA will remain in effect (meaning this test can be used) for the duration of the COVID-19 declaration under Section 564(b)(1) of the Act, 21 U.S.C. section 360bbb-3(b)(1), unless the authorization is terminated or revoked.  Performed at [facility], [address], [place], [place] [address]         Radiology Studies: CT HEAD WO CONTRAST  Result Date: 08/27/2020 CLINICAL DATA:  Patient status post fall.  Patient on Eliquis. EXAM: CT HEAD WITHOUT CONTRAST CT CERVICAL SPINE WITHOUT CONTRAST TECHNIQUE: Multidetector CT imaging of the head and cervical spine was performed following the standard protocol without intravenous contrast. Multiplanar CT image reconstructions of the cervical spine were also generated. COMPARISON:  Brain CT 01/23/2020 FINDINGS: CT HEAD FINDINGS Brain: No evidence of acute infarction, hemorrhage, hydrocephalus, extra-axial collection or mass lesion/mass effect. Ventricles and sulci are appropriate patient's age. Vascular: Unremarkable Skull: Intact. Sinuses/Orbits: There is nonspecific opacification of the left mastoid air cells. Right mastoid air cells are well aerated. Paranasal sinuses are well aerated. Other: None. CT CERVICAL SPINE FINDINGS Alignment: Normal. Skull base and vertebrae: No acute fracture. No primary bone lesion or focal pathologic process.  Soft tissues and spinal canal: No prevertebral fluid or swelling. No visible canal hematoma. Disc levels: No acute fracture. Degenerative disc disease most pronounced C5-6. Upper chest: Negative. Other: None IMPRESSION: 1. No acute intracranial process. 2. No acute cervical spine fracture. 3. Nonspecific opacification of the left mastoid air cells. Electronically Signed   By: Lovey Newcomer M.D.   On: 08/27/2020 12:12   CT Cervical Spine Wo Contrast  Result Date: 08/27/2020 CLINICAL DATA:  Patient status post fall.  Patient on Eliquis. EXAM: CT HEAD WITHOUT CONTRAST CT CERVICAL SPINE WITHOUT CONTRAST TECHNIQUE: Multidetector CT imaging of the head and cervical spine was performed following the standard protocol without intravenous contrast. Multiplanar CT image reconstructions of the cervical spine were also generated. COMPARISON:  Brain CT 01/23/2020 FINDINGS: CT HEAD FINDINGS Brain: No evidence of acute infarction, hemorrhage, hydrocephalus, extra-axial collection or mass lesion/mass effect. Ventricles and sulci are appropriate patient's age. Vascular: Unremarkable Skull: Intact. Sinuses/Orbits: There is nonspecific opacification of the left mastoid air cells. Right mastoid air cells are well aerated. Paranasal sinuses are well aerated. Other: None. CT CERVICAL SPINE FINDINGS Alignment: Normal. Skull base and vertebrae: No acute fracture. No primary bone lesion or focal  pathologic process. Soft tissues and spinal canal: No prevertebral fluid or swelling. No visible canal hematoma. Disc levels: No acute fracture. Degenerative disc disease most pronounced C5-6. Upper chest: Negative. Other: None IMPRESSION: 1. No acute intracranial process. 2. No acute cervical spine fracture. 3. Nonspecific opacification of the left mastoid air cells. Electronically Signed   By: Lovey Newcomer M.D.   On: 08/27/2020 12:12   MR BRAIN WO CONTRAST  Result Date: 08/28/2020 CLINICAL DATA:  Initial evaluation for memory loss. EXAM:  MRI HEAD WITHOUT CONTRAST TECHNIQUE: Multiplanar, multiecho pulse sequences of the brain and surrounding structures were obtained without intravenous contrast. COMPARISON:  Prior head CT from 08/27/2020 FINDINGS: Brain: Examination degraded by motion artifact. Cerebral volume within normal limits for age. Patchy T2/FLAIR hyperintensity within the periventricular deep white matter both cerebral hemispheres as well as the pons, nonspecific, but most like related chronic microvascular ischemic disease, mild to moderate in nature. No abnormal foci of restricted diffusion to suggest acute or subacute ischemia. Gray-white matter differentiation maintained. No encephalomalacia to suggest chronic cortical infarction. No foci of susceptibility artifact to suggest acute or chronic intracranial hemorrhage. No mass lesion, midline shift or mass effect. No hydrocephalus or extra-axial fluid collection. Pituitary gland suprasellar region within normal limits. Midline structures intact. Vascular: Major intracranial vascular flow voids are maintained. Skull and upper cervical spine: Craniocervical junction within normal limits. Bone marrow signal intensity normal. No scalp soft tissue abnormality. Sinuses/Orbits: Globes and orbital soft tissues within normal limits. Paranasal sinuses are largely clear. Left mastoid and middle ear effusion, of uncertain significance. Visualized nasopharynx within normal limits. Inner ear structures grossly normal. Other: None. IMPRESSION: 1. No acute intracranial abnormality. 2. Mild to moderate chronic microvascular ischemic disease for age. 3. Left mastoid and middle ear effusion, of uncertain significance. Correlation with physical exam recommended. Electronically Signed   By: Jeannine Boga M.D.   On: 08/28/2020 23:36   DG Chest Portable 1 View  Result Date: 08/27/2020 CLINICAL DATA:  Fever, cough. EXAM: PORTABLE CHEST 1 VIEW COMPARISON:  July 18, 2020. FINDINGS: Low lung volumes.  Streaky left basilar opacities. No visible pleural effusions or pneumothorax. Mildly enlarged cardiac silhouette. Aortic atherosclerosis. No acute osseous abnormality. IMPRESSION: Streaky left basilar opacities, which could represent atelectasis, aspiration, and/or pneumonia. Dedicated PA and lateral radiographs could further evaluate if clinically indicated. Electronically Signed   By: Margaretha Sheffield MD   On: 08/27/2020 13:17        Scheduled Meds: . atorvastatin  40 mg Oral QHS  . enoxaparin (LOVENOX) injection  40 mg Subcutaneous Q24H  . insulin aspart  0-15 Units Subcutaneous TID WC  . insulin detemir  15 Units Subcutaneous BID  . magnesium gluconate  500 mg Oral BID  . metoprolol tartrate  25 mg Oral BID  . pantoprazole  40 mg Oral Daily  . potassium chloride  40 mEq Oral Once  . sertraline  50 mg Oral Daily   Continuous Infusions: . sodium chloride 250 mL (08/29/20 0019)  . cefTRIAXone (ROCEPHIN)  IV 1 g (08/29/20 0020)  . magnesium sulfate bolus IVPB       LOS: 1 day     Cordelia Poche, MD [facility] 08/29/2020, 9:25 AM  If 7PM-7AM, please contact night-coverage [email or web address]

## 2020-08-29 NOTE — Progress Notes (Signed)
Occupational Therapy Treatment Patient Details Name: [name] MRN: [id number] DOB: Aug 07, 1956 Today's Date: 08/29/2020    History of present illness 64 y.o. female with medical history significant of seasonal allergies, anginal pain, anxiety, depression, liver cirrhosis due to steatohepatitis, uterine cancer, degenerative disc disease, diverticulosis/diverticulitis, gastroparesis, GERD/hiatal hernia, pancreatitis, hypertension, hypothyroidism due to amiodarone, history of pneumonia, history of urolithiasis, history of other nonhemorrhagic CVA with mild left-sided hemiparesis, type 1 diabetes, paroxysmal atrial fibrillation, sick sinus syndrome, orthostatic hypotension who is coming to the emergency department via EMS due to having AMS and history of at least 3 falls at home.   OT comments  Pt seen for OT tx this date. Pt appears to be somewhat less confused this date, oriented to self, although still takes increased processing time to verbalize her name and some long term recall. Follows commands well. Pt performed bed mobility with supervision to CGA and stands with CGA for transfer to [facility] and in standing for clothing mgt after toileting. Slight LOB noted. L lateral lean in bed which she will correct with cues but does not maintain well. Pt + symptomatic and orthostatic vitals reveal positioning hypotension. RN notified and vitals entered into the flowsheet. Pt continues to benefit from skilled OT services; continue to recommend SNF at this time.    Follow Up Recommendations  SNF;Other (comment);Supervision - Intermittent (supervision for all OOB)    Equipment Recommendations  None recommended by OT    Recommendations for Other Services      Precautions / Restrictions Precautions Precautions: Fall;Other (comment) Precaution Comments: take orthstatics Restrictions Weight Bearing Restrictions: No       Mobility Bed Mobility Overal bed mobility: Needs Assistance Bed Mobility:  Supine to Sit;Sit to Supine     Supine to sit: Min guard Sit to supine: Min guard   General bed mobility comments: increased time/effort to perform but no physical assist required  Transfers Overall transfer level: Needs assistance Equipment used: Rolling walker (2 wheeled) Transfers: Sit to/from Stand Sit to Stand: Min guard              Balance Overall balance assessment: Needs assistance Sitting-balance support: No upper extremity supported;Feet supported Sitting balance-Leahy Scale: Fair Sitting balance - Comments: L lateral lean in long sitting in the bed, requiring cues to correct but unable to maintain long term Postural control: Left lateral lean Standing balance support: Bilateral upper extremity supported;During functional activity;Single extremity supported Standing balance-Leahy Scale: Fair Standing balance comment: unsteady, cues for safety, slight LOB when attempts to remove BUE from RW for clothing mgt during toileting                           ADL either performed or assessed with clinical judgement   ADL Overall ADL's : Needs assistance/impaired                     Lower Body Dressing: Sitting/lateral leans;Modified independent Lower Body Dressing Details (indicate cue type and reason): pt sat EOB and adjusted grippy socks without assist and without LOB Toilet Transfer: Min guard;BSC;RW                   Vision Baseline Vision/History: Wears glasses Wears Glasses: At all times Patient Visual Report: No change from baseline     Perception     Praxis      Cognition Arousal/Alertness: Awake/alert Behavior During Therapy: WFL for tasks assessed/performed Overall Cognitive Status: No family/caregiver  present to determine baseline cognitive functioning                                 General Comments: pt oriented to her name (+ time to respond), recalls therapist, and appears to demonstrate slight improvement  in cognition overall, somewhat distractile requiring cues to redirect, follows simple commands well        Exercises     Shoulder Instructions       General Comments      Pertinent Vitals/ Pain       Pain Assessment: No/denies pain  Home Living                                          Prior Functioning/Environment              Frequency  Min 2X/week        Progress Toward Goals  OT Goals(current goals can now be found in the care plan section)  Progress towards OT goals: Progressing toward goals  Acute Rehab OT Goals Patient Stated Goal: to get better OT Goal Formulation: With patient Time For Goal Achievement: 09/11/20 Potential to Achieve Goals: Good  Plan Discharge plan remains appropriate;Frequency remains appropriate    Co-evaluation                 AM-PAC OT "6 Clicks" Daily Activity     Outcome Measure   Help from another person eating meals?: None Help from another person taking care of personal grooming?: None Help from another person toileting, which includes using toliet, bedpan, or urinal?: A Little Help from another person bathing (including washing, rinsing, drying)?: A Little Help from another person to put on and taking off regular upper body clothing?: None Help from another person to put on and taking off regular lower body clothing?: A Little 6 Click Score: 21    End of Session Equipment Utilized During Treatment: Gait belt;Rolling walker  OT Visit Diagnosis: Other abnormalities of gait and mobility (R26.89);Repeated falls (R29.6);Muscle weakness (generalized) (M62.81)   Activity Tolerance Treatment limited secondary to medical complications (Comment) (orthostatic)   Patient Left in bed;with call bell/phone within reach;with bed alarm set   Nurse Communication Other (comment);Mobility status (orthostatic)        Time: 7425-9563 OT Time Calculation (min): 42 min  Charges: OT General Charges $OT  Visit: 1 Visit OT Treatments $Self Care/Home Management : 38-52 mins  Jeni Salles, MPH, MS, OTR/L ascom [phone] 08/29/20, 2:38 PM

## 2020-08-29 NOTE — TOC Initial Note (Signed)
Transition of Care [facility]) - Initial/Assessment Note    Patient Details  Name: [name] MRN: [id number] Date of Birth: [birth year]  Transition of Care [facility]) CM/SW Contact:    Eileen Stanford, LCSW Phone Number: 08/29/2020, 2:45 PM  Clinical Narrative:  Pt is alert and oriented. Pt states she is ok with rehab but she states she needs to talk to her husband first. Pt gave CSW verbal permission to contact her spouse. Pt states please tell him that I will do whatever he thinks I should. CSW spoke with pt's spouse and he states he is agreeable if pt is agreeable. Pt's spouse didn't have a SNF preference and is agreeable to CSW sending pt's referral to [facility] and [place] facilities.                 Expected Discharge Plan: Skilled Nursing Facility Barriers to Discharge: Continued Medical Work up   Patient Goals and CMS Choice Patient states their goals for this hospitalization and ongoing recovery are:: to get better   Choice offered to / list presented to : Spouse,Patient  Expected Discharge Plan and Services Expected Discharge Plan: [place] In-house Referral: Clinical Social Work   Post Acute Care Choice: [place] Living arrangements for the past 2 months: [place]                                      Prior Living Arrangements/Services Living arrangements for the past 2 months: Single Family Home Lives with:: Spouse Patient language and need for interpreter reviewed:: Yes Do you feel safe going back to the place where you live?: Yes      Need for Family Participation in Patient Care: Yes (Comment) Care giver support system in place?: Yes (comment)   Criminal Activity/Legal Involvement Pertinent to Current Situation/Hospitalization: No - Comment as needed  Activities of Daily Living Home Assistive Devices/Equipment: Walker (specify type),Cane (specify quad or straight) ADL Screening (condition at time of  admission) Patient's cognitive ability adequate to safely complete daily activities?: No Is the patient deaf or have difficulty hearing?: Yes Does the patient have difficulty seeing, even when wearing glasses/contacts?: Yes Does the patient have difficulty concentrating, remembering, or making decisions?: Yes Patient able to express need for assistance with ADLs?: Yes Does the patient have difficulty dressing or bathing?: No Independently performs ADLs?: Yes (appropriate for developmental age) Does the patient have difficulty walking or climbing stairs?: Yes Weakness of Legs: Left Weakness of Arms/Hands: Left  Permission Sought/Granted Permission sought to share information with : Family Supports    Share Information with NAME: [name]     Permission granted to share info w Relationship: spouse     Emotional Assessment Appearance:: Appears stated age Attitude/Demeanor/Rapport: Engaged Affect (typically observed): Accepting,Appropriate Orientation: : Oriented to Situation,Oriented to  Time,Oriented to Place,Oriented to Self Alcohol / Substance Use: Not Applicable Psych Involvement: No (comment)  Admission diagnosis:  PAF (paroxysmal atrial fibrillation) ([place]) [I48.0] Atrial fibrillation with RVR ([place]) [I48.91] Fall, initial encounter [W19.XXXA] Patient Active Problem List   Diagnosis Date Noted  . Atrial fibrillation with RVR ([place]) 08/27/2020  . Hypoglycemia 08/27/2020  . CKD (chronic kidney disease), stage III ([place]) 08/27/2020  . Shortness of breath 07/16/2020  . [place] intertrigo 07/16/2020  . Sepsis ([place]) 11/23/2019  . Acute pyelonephritis 09/30/2019  . Acute pain of left foot 08/18/2019  . Type II diabetes mellitus with  renal manifestations ([place]) 08/13/2019  . Weakness of both lower extremities 07/31/2019  . Altered mental status 07/14/2019  . GAD (generalized anxiety disorder) 06/11/2019  . Poor memory 06/11/2019  . Constipation 05/25/2019  . Acute medial meniscus  tear of left knee 05/25/2019  . AKI (acute kidney injury) ([place]) 05/16/2019  . Closed fracture of left distal fibula 05/16/2019  . Sinus tachycardia 05/05/2019  . Congestive dilated cardiomyopathy ([place]) 04/14/2019  . CAD (coronary artery disease) 04/10/2019  . Closed fracture of lateral malleolus 04/10/2019  . Lumbar facet syndrome (Bilateral) (R>L) 03/09/2019  . Long term (current) use of anticoagulants 03/09/2019  . Unspecified cirrhosis of liver ([place]) 02/11/2019  . Hypoalbuminemia 02/11/2019  . Edema due to hypoalbuminemia 02/11/2019  . Lower extremity edema 01/26/2019  . Prolonged QT interval 12/17/2018  . Hypomagnesemia 12/17/2018  . Uncontrolled type 2 diabetes mellitus ([place]) 12/17/2018  . H/O TIA (transient ischemic attack) and stroke 12/17/2018  . Personal history of surgery to heart and great vessels, presenting hazards to health 12/17/2018  . DDD (degenerative disc disease), lumbar 12/17/2018  . Lumbar intervertebral disc displacement 12/17/2018  . Chronic musculoskeletal pain 12/17/2018  . Chronic generalized pain 12/17/2018  . Hyponatremia 12/17/2018  . Fibromyalgia syndrome 12/17/2018  . Other intervertebral disc displacement, lumbar region 12/17/2018  . Vitamin D deficiency 11/26/2018  . Elevated C-reactive protein (CRP) 11/26/2018  . Elevated sed rate 11/26/2018  . Chronic low back pain (Primary area of Pain) (Bilateral) (L>R) w/ sciatica (Bilateral) 11/25/2018  . Chronic lower extremity pain (Secondary Area of Pain) (Bilateral) (L>R) 11/25/2018  . Chronic neck pain 11/25/2018  . Pharmacologic therapy 11/25/2018  . Disorder of skeletal system 11/25/2018  . Problems influencing health status 11/25/2018  . Renal mass 10/06/2018  . Chronic intermittent abdominal pain 09/25/2018  . Unspecified abdominal pain 09/25/2018  . Frequent falls 09/12/2018  . Orthostatic hypotension 08/18/2018  . Normochromic normocytic anemia 08/18/2018  . Acute renal failure superimposed on  stage 3a chronic kidney disease ([place]) 08/18/2018  . History of hypothyroidism 08/14/2018  . Medical non-compliance 08/14/2018  . Diabetic ketoacidosis ([place]) 05/22/2018  . UTI (urinary tract infection) 05/22/2018  . NSTEMI (non-ST elevated myocardial infarction) ([place]) 01/11/2016  . Tachy-brady syndrome ([place]) 12/28/2015  . PAF (paroxysmal atrial fibrillation) ([place]) 11/24/2015  . Type 2 diabetes mellitus with neurologic complication ([place]) 53/29/9242  . S/P cholecystectomy 11/23/2015  . Narcotic abuse ([place]) 11/22/2015  . Chronic superficial gastritis without bleeding 11/22/2015  . History of Clostridium difficile 11/22/2015  . History of TIA (transient ischemic attack) 11/22/2015  . Mixed hyperlipidemia 11/22/2015  . Diverticulosis 11/22/2015  . History of uterine cancer 11/22/2015  . Hypothyroidism, unspecified 11/22/2015  . Intractable cyclical vomiting with nausea 11/22/2015  . Community acquired pneumonia of left lower lobe of lung 11/14/2015  . Narcotic withdrawal ([place]) 11/11/2015  . Hyperlipidemia 08/31/2015  . Hypotension 08/06/2015  . Cryptogenic cirrhosis ([place]) 07/10/2015  . Atrial fibrillation ([place]) 07/10/2015  . Malnutrition of moderate degree 07/04/2015  . Symptomatic bradycardia 05/27/2015  . Chronic anemia 05/19/2015  . Thrombocytopenia ([place]) 05/19/2015  . Hypokalemia 04/06/2015  . Hyperlipidemia with target LDL less than 100 04/06/2015  . Syncope 03/11/2015  . CVA (cerebral vascular accident) ([place]) 02/15/2015  . Essential hypertension 01/12/2015  . Chronically on opiate therapy 01/12/2015  . Cirrhosis of liver not due to alcohol ([place]) 2016  . S/P Nissen fundoplication (without gastrostomy tube) procedure 05/11/2014  . Dysphagia 04/19/2014  . DEPRESSION/ANXIETY 06/27/2007  . Myofascial pain syndrome 06/27/2007  .  Chronic pain syndrome 03/28/2007  . GERD 03/27/2007  . Diverticulosis of large intestine without perforation or abscess without bleeding 03/27/2007  .  Proteinuria 03/27/2007   PCP:  [name], NP Pharmacy:   [place], [place] - [address] [place] [address] Phone: [phone] Fax: [phone]     Social Determinants of Health (SDOH) Interventions    Readmission Risk Interventions Readmission Risk Prevention Plan 10/01/2019 07/18/2019 07/16/2019  Transportation Screening Complete Complete Complete  Medication Review Press photographer) Complete Complete Complete  PCP or Specialist appointment within 3-5 days of discharge Complete Complete -  [place] or Home Care Consult Complete Complete Complete  SW Recovery Care/Counseling Consult Complete Complete -  Palliative Care Screening Not Applicable Not Applicable Not [place] - Not Applicable Patient Refused  Some recent data might be hidden

## 2020-08-29 NOTE — Consult Note (Signed)
Reason for Consult:AMS Requesting Physician: Lonny Prude  CC: AMS  I have been asked by Dr. Lonny Prude to see this patient in consultation for AMS.  HPI: [name] is an 64 y.o. female with medical history significant of seasonal allergies, anginal pain, anxiety, depression, liver cirrhosis due to steatohepatitis, uterine cancer, degenerative disc disease, diverticulosis/diverticulitis, gastroparesis, GERD/hiatal hernia, pancreatitis, interesting in tips of session, hypertension, hypothyroidism due to amiodarone, history of pneumonia, history of urolithiasis, history of other nonhemorrhagic CVA with mild left-sided hemiparesis, type 1 diabetes, paroxysmal atrial fibrillation, sick sinus syndrome, orthostatic hypotension who is coming to the emergency department via EMS due to having AMS and history of at least 3 falls at home.  Patient reports remembering the first fall but unable to remember the last two.  Does recall the details that her husband told the nurse in the ED.   Per attending has remained significantly confused.  Concern is for etiology.    Past Medical History:  Diagnosis Date  . Acute encephalopathy 05/22/2018  . Allergy   . Anginal pain ([place])   . Anxiety   . Ascites   . C. difficile colitis 07/10/2015  . Cancer (HCC)    HX OF CANCER OF UTERUS   . Cirrhosis of liver not due to alcohol ([place]) 2016  . Degenerative disk disease   . Diverticulitis   . Gastroparesis   . GERD (gastroesophageal reflux disease)   . History of hiatal hernia   . Hypertension   . Hypothyroid   . Hypothyroidism due to amiodarone   . Ileus ([place]) 08/01/2015  . Intussusception intestine ([place]) 05/2015  . Orthostatic hypotension   . PAF (paroxysmal atrial fibrillation) ([place]) 03/2015   a. new onset 03/2015 in setting of intractable N/V; b. on Eliquis 5 mg bid; c. CHADSVASc 4 (DM, TIA x 2, female)  . Pancreatitis   . Pneumonia 11/14/2015  . Pneumonia 07/2020  . Right ureteral stone 07/14/2016  . Sepsis ([place])  12/15/2017  . Sick sinus syndrome ([place])   . Stomach ulcer   . Stroke [facility])    with minimal left sided weakness  . Syncope 01/2015  . Syncope due to orthostatic hypotension 05/18/2015  . Tachyarrhythmia 01/10/2016  . TIA (transient ischemic attack) 02/2015  . Type 1 diabetes ([place])    on levemir  . UTERINE CANCER, HX OF 03/27/2007   Qualifier: Diagnosis of  By: Maxie Better FNP, Rosalita Levan   . UTI (urinary tract infection) 05/22/2018    Past Surgical History:  Procedure Laterality Date  . ABDOMINAL HYSTERECTOMY    . CARDIAC CATHETERIZATION N/A 01/12/2016   Procedure: Left Heart Cath and Coronary Angiography;  Surgeon: Wellington Hampshire, MD;  Location: [place] [facility];  Service: Cardiovascular;  Laterality: N/A;  . CHOLECYSTECTOMY    . CYSTOSCOPY W/ RETROGRADES Bilateral 12/11/2019   Procedure: CYSTOSCOPY WITH RETROGRADE PYELOGRAM;  Surgeon: Billey Co, MD;  Location: [facility] ORS;  Service: Urology;  Laterality: Bilateral;  . CYSTOSCOPY WITH BIOPSY N/A 12/11/2019   Procedure: CYSTOSCOPY WITH BLADDER BIOPSY;  Surgeon: Billey Co, MD;  Location: [facility] ORS;  Service: Urology;  Laterality: N/A;  . CYSTOSCOPY WITH FULGERATION N/A 12/11/2019   Procedure: CYSTOSCOPY WITH FULGERATION;  Surgeon: Billey Co, MD;  Location: [facility] OR[facility];  Service: Urology;  Laterality: N/A;  . CYSTOSCOPY WITH STENT PLACEMENT Right 12/11/2019   Procedure: CYSTOSCOPY WITH STENT PLACEMENT;  Surgeon: Billey Co, MD;  Location: [facility] OR[facility];  Service: Urology;  Laterality: Right;  . CYSTOSCOPY WITH  URETHRAL DILATATION Right 12/11/2019   Procedure: CYSTOSCOPY WITH URETERAL DILATATION;  Surgeon: Billey Co, MD;  Location: [facility] ORS;  Service: Urology;  Laterality: Right;  . CYSTOSCOPY/URETEROSCOPY/HOLMIUM LASER Right 07/14/2016   Procedure: CYSTOSCOPY/URETEROSCOPY/HOLMIUM LASER;  Surgeon: Alexis Frock, MD;  Location: [facility] ORS;  Service: Urology;  Laterality: Right;  . ESOPHAGOGASTRODUODENOSCOPY N/A 04/04/2015    Procedure: ESOPHAGOGASTRODUODENOSCOPY (EGD);  Surgeon: Hulen Luster, MD;  Location: [facility] ENDOSCOPY;  Service: Endoscopy;  Laterality: N/A;  . ESOPHAGOGASTRODUODENOSCOPY N/A 12/28/2017   Procedure: ESOPHAGOGASTRODUODENOSCOPY (EGD);  Surgeon: Lin Landsman, MD;  Location: [facility] ENDOSCOPY;  Service: Gastroenterology;  Laterality: N/A;  . ESOPHAGOGASTRODUODENOSCOPY (EGD) WITH PROPOFOL N/A 01/18/2016   Procedure: ESOPHAGOGASTRODUODENOSCOPY (EGD) WITH PROPOFOL;  Surgeon: Lucilla Lame, MD;  Location: [facility] ENDOSCOPY;  Service: Endoscopy;  Laterality: N/A;  . FLEXIBLE SIGMOIDOSCOPY N/A 01/18/2016   Procedure: FLEXIBLE SIGMOIDOSCOPY;  Surgeon: Lucilla Lame, MD;  Location: [facility] ENDOSCOPY;  Service: Endoscopy;  Laterality: N/A;  . HERNIA REPAIR    . URETEROSCOPY Right 12/11/2019   Procedure: Christen Butter;  Surgeon: Billey Co, MD;  Location: [facility] ORS;  Service: Urology;  Laterality: Right;    Family History  Problem Relation Age of Onset  . Hypertension Mother   . CAD Sister   . Heart attack Sister        Deceased 22-Nov-2014  . CAD Brother     Social History:  reports that she has quit smoking. Her smoking use included cigarettes. She has never used smokeless tobacco. She reports that she does not drink alcohol and does not use drugs.  Allergies  Allergen Reactions  . Aspirin Rash  . Codeine Sulfate Rash  . Erythromycin Rash    Reaction:  Fever   . Prednisone Swelling  . Rosiglitazone Maleate Swelling  . Tetanus-Diphtheria Toxoids Td Rash and Other (See Comments)    Reaction:  Fever     Medications:  I have reviewed the patient's current medications. Prior to Admission:  Medications Prior to Admission  Medication Sig Dispense Refill Last Dose  . albuterol (VENTOLIN HFA) 108 (90 Base) MCG/ACT inhaler Inhale 2 puffs into the lungs every 4 (four) hours as needed for wheezing or shortness of breath. 8 g 1   . atorvastatin (LIPITOR) 40 MG tablet Take 40 mg by mouth at bedtime.      .  cholecalciferol (VITAMIN D3) 25 MCG (1000 UNIT) tablet Take 1,000 Units by mouth daily.     . cyclobenzaprine (FLEXERIL) 10 MG tablet Take 1 tablet (10 mg total) by mouth 3 (three) times daily as needed for muscle spasms. Must last 30 days. 90 tablet 2   . glucose blood (ACCU-CHEK AVIVA PLUS) test strip Use as instructed to test blood sugar up to 4 times daily 300 each 2   . insulin detemir (LEVEMIR FLEXTOUCH) 100 UNIT/ML FlexPen INJECT 30 UNITS SUBCUTANEOUSLY EVERY MORNING AND AT BEDTIME 15 mL 1   . magnesium gluconate (MAGONATE) 500 MG tablet Take 500 mg by mouth 2 (two) times daily.     . metoprolol tartrate (LOPRESSOR) 25 MG tablet Take 25 mg by mouth 2 (two) times daily.     . nitroGLYCERIN (NITROSTAT) 0.3 MG SL tablet Place 1 tablet (0.3 mg total) under the tongue every 5 (five) minutes as needed for chest pain. 100 tablet 0   . oxyCODONE (OXY IR/ROXICODONE) 5 MG immediate release tablet Take 1 tablet (5 mg total) by mouth 5 (five) times daily for 7 days. Max: 5/day. Must last 7 days. 35 tablet 0   .  oxyCODONE (OXY IR/ROXICODONE) 5 MG immediate release tablet Take 1 tablet (5 mg total) by mouth 4 (four) times daily for 7 days. Max: 4/day. Must last 7 days. 28 tablet 0   . [START ON 08/31/2020] oxyCODONE (OXY IR/ROXICODONE) 5 MG immediate release tablet Take 1 tablet (5 mg total) by mouth 3 (three) times daily for 7 days. Max: 3/day. Must last 7 days. 21 tablet 0   . [START ON 09/07/2020] oxyCODONE (OXY IR/ROXICODONE) 5 MG immediate release tablet Take 1 tablet (5 mg total) by mouth 2 (two) times daily for 7 days. Max: 2/day. Must last 7 days. 14 tablet 0   . [START ON 09/14/2020] oxyCODONE (OXY IR/ROXICODONE) 5 MG immediate release tablet Take 1 tablet (5 mg total) by mouth daily for 7 days. Max: 1/day. Must last 7 days. 7 tablet 0   . pantoprazole (PROTONIX) 40 MG tablet Take 1 tablet (40 mg total) by mouth daily. 30 tablet 0   . sertraline (ZOLOFT) 50 MG tablet TAKE ONE TABLET EVERY DAY 90 tablet  1   . Spacer/Aero-Hold Chamber Bags MISC Use spacer with inhaler when you have shortness of breath 1 each 1    Scheduled: . atorvastatin  40 mg Oral QHS  . enoxaparin (LOVENOX) injection  40 mg Subcutaneous Q24H  . insulin aspart  0-15 Units Subcutaneous TID WC  . insulin detemir  20 Units Subcutaneous BID  . magnesium gluconate  500 mg Oral BID  . metoprolol tartrate  25 mg Oral BID  . pantoprazole  40 mg Oral Daily  . potassium chloride  40 mEq Oral Once  . sertraline  50 mg Oral Daily    ROS: History obtained from the patient  General ROS: negative for - chills, fatigue, fever, night sweats, weight gain or weight loss Psychological ROS: negative for - behavioral disorder, hallucinations, memory difficulties, mood swings or suicidal ideation Ophthalmic ROS: negative for - blurry vision, double vision, eye pain or loss of vision ENT ROS: negative for - epistaxis, nasal discharge, oral lesions, sore throat, tinnitus or vertigo Allergy and Immunology ROS: negative for - hives or itchy/watery eyes Hematological and Lymphatic ROS: negative for - bleeding problems, bruising or swollen lymph nodes Endocrine ROS: negative for - galactorrhea, hair pattern changes, polydipsia/polyuria or temperature intolerance Respiratory ROS: negative for - cough, hemoptysis, shortness of breath or wheezing Cardiovascular ROS: negative for - chest pain, dyspnea on exertion, edema or irregular heartbeat Gastrointestinal ROS: negative for - abdominal pain, diarrhea, hematemesis, nausea/vomiting or stool incontinence Genito-Urinary ROS: negative for - dysuria, hematuria, incontinence or urinary frequency/urgency Musculoskeletal ROS: negative for - joint swelling or muscular weakness Neurological ROS: as noted in HPI, headache Dermatological ROS: negative for rash and skin lesion changes  Physical Examination: Blood pressure (!) 157/97, pulse 89, temperature 98.5 F (36.9 C), temperature source Oral, resp.  rate 18, height 5' 3"  (1.6 m), weight 78 kg, SpO2 99 %.  HEENT-  Normocephalic, no lesions, without obvious abnormality.  Normal external eye and conjunctiva.  Normal TM's bilaterally.  Normal auditory canals and external ears. Normal external nose, mucus membranes and septum.  Normal pharynx. Cardiovascular- S1, S2 normal, pulses palpable throughout   Lungs- chest clear, no wheezing, rales, normal symmetric air entry Abdomen- soft, non-tender; bowel sounds normal; no masses,  no organomegaly Extremities- mild LLE edema Lymph-no adenopathy palpable Musculoskeletal-no joint tenderness, deformity or swelling Skin-warm and dry, no hyperpigmentation, vitiligo, or suspicious lesions  Neurological Examination  Mental Status: Alert, oriented to name and place but  not to year.  Reports that it is 1980.  Able to tell me her husband's name.  Speech fluent without evidence of aphasia.  Able to follow 3 step commands without difficulty.  Negotiates the room well being able to find the remote and know how to mute the sound.   Cranial Nerves: II: Discs flat bilaterally; Visual fields grossly normal, pupils equal, round, reactive to light and accommodation III,IV, VI: ptosis not present, extra-ocular motions intact bilaterally V,VII: smile symmetric, facial light touch sensation normal bilaterally VIII: hearing normal bilaterally IX,X: gag reflex present XI: bilateral shoulder shrug XII: midline tongue extension Motor: Right : Upper extremity   5/5    Left:     Upper extremity   5/5  Lower extremity   5/5     Lower extremity   5/5 Tone and bulk:normal tone throughout; no atrophy noted Sensory: Pinprick and light touch intact throughout, bilaterally Deep Tendon Reflexes: Symmetric throughout Plantars: Right: mute   Left: mute Cerebellar: Normal finger-to-nose and normal heel-to-shin testing bilaterally Gait: not tested due to safety concerns   Laboratory Studies:   Basic Metabolic Panel: Recent  Labs  Lab 08/25/20 1116 08/27/20 1216 08/27/20 1727 08/28/20 0947  NA 138  --   --  134*  K 2.8*  --   --  3.9  CL 106  --   --  104  CO2 22  --   --  23  GLUCOSE 46*  --   --  301*  BUN 15  --   --  16  CREATININE 1.61*  --   --  1.40*  CALCIUM 8.9  --   --  8.1*  MG  --  2.1  --  2.0  PHOS  --   --  3.3  --     Liver Function Tests: Recent Labs  Lab 08/28/20 0947  AST 38  ALT 27  ALKPHOS 129*  BILITOT 0.9  PROT 6.7  ALBUMIN 3.1*   No results for input(s): LIPASE, AMYLASE in the last 168 hours. No results for input(s): AMMONIA in the last 168 hours.  CBC: Recent Labs  Lab 08/25/20 1116 08/28/20 0947  WBC 7.5 5.4  HGB 9.8* 9.5*  HCT 31.6* 31.3*  MCV 84.5 87.2  PLT 171 152    Cardiac Enzymes: No results for input(s): CKTOTAL, CKMB, CKMBINDEX, TROPONINI in the last 168 hours.  BNP: Invalid input(s): POCBNP  CBG: Recent Labs  Lab 08/28/20 1632 08/28/20 2030 08/28/20 2358 08/29/20 0748 08/29/20 1140  GLUCAP 131* 128* 169* 326* 89    Microbiology: Results for orders placed or performed during the hospital encounter of 08/27/20  Resp Panel by RT-PCR (Flu A&B, Covid) Nasopharyngeal Swab     Status: None   Collection Time: 08/27/20  2:29 PM   Specimen: Nasopharyngeal Swab; Nasopharyngeal(NP) swabs in vial transport medium  Result Value Ref Range Status   SARS Coronavirus 2 by RT PCR NEGATIVE NEGATIVE Final    Comment: (NOTE) SARS-CoV-2 target nucleic acids are NOT DETECTED.  The SARS-CoV-2 RNA is generally detectable in upper respiratory specimens during the acute phase of infection. The lowest concentration of SARS-CoV-2 viral copies this assay can detect is 138 copies/mL. A negative result does not preclude SARS-Cov-2 infection and should not be used as the sole basis for treatment or other patient management decisions. A negative result may occur with  improper specimen collection/handling, submission of specimen other than nasopharyngeal swab,  presence of viral mutation(s) within the areas targeted by this  assay, and inadequate number of viral copies(<138 copies/mL). A negative result must be combined with clinical observations, patient history, and epidemiological information. The expected result is Negative.  Fact Sheet for Patients:  EntrepreneurPulse.com.au  Fact Sheet for Healthcare Providers:  IncredibleEmployment.be  This test is no t yet approved or cleared by the [place] FDA and  has been authorized for detection and/or diagnosis of SARS-CoV-2 by FDA under an Emergency Use Authorization (EUA). This EUA will remain  in effect (meaning this test can be used) for the duration of the COVID-19 declaration under Section 564(b)(1) of the Act, 21 U.S.C.section 360bbb-3(b)(1), unless the authorization is terminated  or revoked sooner.       Influenza A by PCR NEGATIVE NEGATIVE Final   Influenza B by PCR NEGATIVE NEGATIVE Final    Comment: (NOTE) The Xpert Xpress SARS-CoV-2/FLU/RSV plus assay is intended as an aid in the diagnosis of influenza from Nasopharyngeal swab specimens and should not be used as a sole basis for treatment. Nasal washings and aspirates are unacceptable for Xpert Xpress SARS-CoV-2/FLU/RSV testing.  Fact Sheet for Patients: EntrepreneurPulse.com.au  Fact Sheet for Healthcare Providers: IncredibleEmployment.be  This test is not yet approved or cleared by the [place] FDA and has been authorized for detection and/or diagnosis of SARS-CoV-2 by FDA under an Emergency Use Authorization (EUA). This EUA will remain in effect (meaning this test can be used) for the duration of the COVID-19 declaration under Section 564(b)(1) of the Act, 21 U.S.C. section 360bbb-3(b)(1), unless the authorization is terminated or revoked.  Performed at [facility], [address] [place], [place] [address]      Coagulation Studies: No results for input(s): LABPROT, INR in the last 72 hours.  Urinalysis:  Recent Labs  Lab 08/27/20 1800  COLORURINE YELLOW*  LABSPEC 1.010  PHURINE 5.0  GLUCOSEU 150*  HGBUR LARGE*  BILIRUBINUR NEGATIVE  KETONESUR NEGATIVE  PROTEINUR 30*  NITRITE NEGATIVE  LEUKOCYTESUR LARGE*    Lipid Panel:     Component Value Date/Time   CHOL 123 08/18/2019 1017   CHOL 182 11/12/2011 0325   TRIG 145.0 08/18/2019 1017   TRIG 149 11/12/2011 0325   HDL 35.40 (L) 08/18/2019 1017   HDL 23 (L) 11/12/2011 0325   CHOLHDL 3 08/18/2019 1017   VLDL 29.0 08/18/2019 1017   VLDL 30 11/12/2011 0325   LDLCALC 58 08/18/2019 1017   LDLCALC 129 (H) 11/12/2011 0325    HgbA1C:  Lab Results  Component Value Date   HGBA1C 10.0 (H) 08/28/2020    Urine Drug Screen:      Component Value Date/Time   LABOPIA NONE DETECTED 01/23/2020 0253   COCAINSCRNUR NONE DETECTED 01/23/2020 0253   COCAINSCRNUR NONE DETECTED 08/13/2019 2346   LABBENZ NONE DETECTED 01/23/2020 0253   AMPHETMU NONE DETECTED 01/23/2020 0253   THCU NONE DETECTED 01/23/2020 0253   LABBARB NONE DETECTED 01/23/2020 0253    Alcohol Level: No results for input(s): ETH in the last 168 hours.  Other results: EKG: normal sinus rhythm at 73 bpm.  Imaging: MR BRAIN WO CONTRAST  Result Date: 08/28/2020 CLINICAL DATA:  Initial evaluation for memory loss. EXAM: MRI HEAD WITHOUT CONTRAST TECHNIQUE: Multiplanar, multiecho pulse sequences of the brain and surrounding structures were obtained without intravenous contrast. COMPARISON:  Prior head CT from 08/27/2020 FINDINGS: Brain: Examination degraded by motion artifact. Cerebral volume within normal limits for age. Patchy T2/FLAIR hyperintensity within the periventricular deep white matter both cerebral hemispheres as well as the pons, nonspecific, but most  like related chronic microvascular ischemic disease, mild to moderate in nature. No abnormal foci of restricted  diffusion to suggest acute or subacute ischemia. Gray-white matter differentiation maintained. No encephalomalacia to suggest chronic cortical infarction. No foci of susceptibility artifact to suggest acute or chronic intracranial hemorrhage. No mass lesion, midline shift or mass effect. No hydrocephalus or extra-axial fluid collection. Pituitary gland suprasellar region within normal limits. Midline structures intact. Vascular: Major intracranial vascular flow voids are maintained. Skull and upper cervical spine: Craniocervical junction within normal limits. Bone marrow signal intensity normal. No scalp soft tissue abnormality. Sinuses/Orbits: Globes and orbital soft tissues within normal limits. Paranasal sinuses are largely clear. Left mastoid and middle ear effusion, of uncertain significance. Visualized nasopharynx within normal limits. Inner ear structures grossly normal. Other: None. IMPRESSION: 1. No acute intracranial abnormality. 2. Mild to moderate chronic microvascular ischemic disease for age. 3. Left mastoid and middle ear effusion, of uncertain significance. Correlation with physical exam recommended. Electronically Signed   By: Jeannine Boga M.D.   On: 08/28/2020 23:36     Assessment/Plan:  64 y.o. female with medical history significant of seasonal allergies, anginal pain, anxiety, depression, liver cirrhosis due to steatohepatitis, uterine cancer, degenerative disc disease, diverticulosis/diverticulitis, gastroparesis, GERD/hiatal hernia, pancreatitis, interesting in tips of session, hypertension, hypothyroidism due to amiodarone, history of pneumonia, history of urolithiasis, history of other nonhemorrhagic CVA with mild left-sided hemiparesis, type 1 diabetes, paroxysmal atrial fibrillation, sick sinus syndrome, orthostatic hypotension who is coming to the emergency department via EMS due to having AMS and history of at least 3 falls at home.  Based on reports of prior mental status  evaluations of this patient while hospitalized it does appear that the patient is slowly improving.  MRI of the brain personally reviewed and shows no acute changes.  Falls likely a consequence of orthostasis and memory problems are likely multifactorial.  Small vessel ischemic changes are noted on imaging.  Patient also reports multiple head injuries therefore there may be some component of post-concussive syndrome.  If this is the case would expect some continued improvement over time.  TSH also elevated and patient with a history of elevated CRP.   B12 is normal.    Recommendations: 1. Recommend follow up of elevated TSH 2. Would repeat CRP 3. Agree with treatment of orthostasis 4. Would follow up with neurology on an outpatient basis for neuropsych testing.     Alexis Goodell, MD Neurology  08/29/2020, 2:33 PM

## 2020-08-29 NOTE — NC FL2 (Signed)
[place] LEVEL OF CARE SCREENING TOOL     IDENTIFICATION  Patient Name: [name] Birthdate: 31-Oct-1955 Sex: female Admission Date (Current Location): 08/27/2020  [place] and [place] Number:  Engineering geologist and Address:  [facility], [address] [place], [place] [address]      Provider Number: [id number]  Attending Physician Name and Address:  Mariel Aloe, MD  Relative Name and Phone Number:       Current Level of Care: Hospital Recommended Level of Care: [place] Prior Approval Number:    Date Approved/Denied:   PASRR Number: [id number]  Discharge Plan: SNF    Current Diagnoses: Patient Active Problem List   Diagnosis Date Noted  . Atrial fibrillation with RVR ([place]) 08/27/2020  . Hypoglycemia 08/27/2020  . CKD (chronic kidney disease), stage III ([place]) 08/27/2020  . Shortness of breath 07/16/2020  . [place] intertrigo 07/16/2020  . Sepsis ([place]) 11/23/2019  . Acute pyelonephritis 09/30/2019  . Acute pain of left foot 08/18/2019  . Type II diabetes mellitus with renal manifestations ([place]) 08/13/2019  . Weakness of both lower extremities 07/31/2019  . Altered mental status 07/14/2019  . GAD (generalized anxiety disorder) 06/11/2019  . Poor memory 06/11/2019  . Constipation 05/25/2019  . Acute medial meniscus tear of left knee 05/25/2019  . AKI (acute kidney injury) ([place]) 05/16/2019  . Closed fracture of left distal fibula 05/16/2019  . Sinus tachycardia 05/05/2019  . Congestive dilated cardiomyopathy ([place]) 04/14/2019  . CAD (coronary artery disease) 04/10/2019  . Closed fracture of lateral malleolus 04/10/2019  . Lumbar facet syndrome (Bilateral) (R>L) 03/09/2019  . Long term (current) use of anticoagulants 03/09/2019  . Unspecified cirrhosis of liver ([place]) 02/11/2019  . Hypoalbuminemia 02/11/2019  . Edema due to hypoalbuminemia 02/11/2019  . Lower extremity edema 01/26/2019  .  Prolonged QT interval 12/17/2018  . Hypomagnesemia 12/17/2018  . Uncontrolled type 2 diabetes mellitus ([place]) 12/17/2018  . H/O TIA (transient ischemic attack) and stroke 12/17/2018  . Personal history of surgery to heart and great vessels, presenting hazards to health 12/17/2018  . DDD (degenerative disc disease), lumbar 12/17/2018  . Lumbar intervertebral disc displacement 12/17/2018  . Chronic musculoskeletal pain 12/17/2018  . Chronic generalized pain 12/17/2018  . Hyponatremia 12/17/2018  . Fibromyalgia syndrome 12/17/2018  . Other intervertebral disc displacement, lumbar region 12/17/2018  . Vitamin D deficiency 11/26/2018  . Elevated C-reactive protein (CRP) 11/26/2018  . Elevated sed rate 11/26/2018  . Chronic low back pain (Primary area of Pain) (Bilateral) (L>R) w/ sciatica (Bilateral) 11/25/2018  . Chronic lower extremity pain (Secondary Area of Pain) (Bilateral) (L>R) 11/25/2018  . Chronic neck pain 11/25/2018  . Pharmacologic therapy 11/25/2018  . Disorder of skeletal system 11/25/2018  . Problems influencing health status 11/25/2018  . Renal mass 10/06/2018  . Chronic intermittent abdominal pain 09/25/2018  . Unspecified abdominal pain 09/25/2018  . Frequent falls 09/12/2018  . Orthostatic hypotension 08/18/2018  . Normochromic normocytic anemia 08/18/2018  . Acute renal failure superimposed on stage 3a chronic kidney disease ([place]) 08/18/2018  . History of hypothyroidism 08/14/2018  . Medical non-compliance 08/14/2018  . Diabetic ketoacidosis ([place]) 05/22/2018  . UTI (urinary tract infection) 05/22/2018  . NSTEMI (non-ST elevated myocardial infarction) ([place]) 01/11/2016  . Tachy-brady syndrome ([place]) 12/28/2015  . PAF (paroxysmal atrial fibrillation) ([place]) 11/24/2015  . Type 2 diabetes mellitus with neurologic complication ([place]) 32/35/5732  . S/P cholecystectomy 11/23/2015  . Narcotic abuse ([place]) 11/22/2015  . Chronic superficial  gastritis without bleeding 11/22/2015   . History of Clostridium difficile 11/22/2015  . History of TIA (transient ischemic attack) 11/22/2015  . Mixed hyperlipidemia 11/22/2015  . Diverticulosis 11/22/2015  . History of uterine cancer 11/22/2015  . Hypothyroidism, unspecified 11/22/2015  . Intractable cyclical vomiting with nausea 11/22/2015  . Community acquired pneumonia of left lower lobe of lung 11/14/2015  . Narcotic withdrawal ([place]) 11/11/2015  . Hyperlipidemia 08/31/2015  . Hypotension 08/06/2015  . Cryptogenic cirrhosis ([place]) 07/10/2015  . Atrial fibrillation ([place]) 07/10/2015  . Malnutrition of moderate degree 07/04/2015  . Symptomatic bradycardia 05/27/2015  . Chronic anemia 05/19/2015  . Thrombocytopenia ([place]) 05/19/2015  . Hypokalemia 04/06/2015  . Hyperlipidemia with target LDL less than 100 04/06/2015  . Syncope 03/11/2015  . CVA (cerebral vascular accident) ([place]) 02/15/2015  . Essential hypertension 01/12/2015  . Chronically on opiate therapy 01/12/2015  . Cirrhosis of liver not due to alcohol ([place]) 2016  . S/P Nissen fundoplication (without gastrostomy tube) procedure 05/11/2014  . Dysphagia 04/19/2014  . DEPRESSION/ANXIETY 06/27/2007  . Myofascial pain syndrome 06/27/2007  . Chronic pain syndrome 03/28/2007  . GERD 03/27/2007  . Diverticulosis of large intestine without perforation or abscess without bleeding 03/27/2007  . Proteinuria 03/27/2007    Orientation RESPIRATION BLADDER Height & Weight     Self,Time,Situation,Place  Normal External catheter,Incontinent (placed 12/18) Weight: 171 lb 15.3 oz (78 kg) Height:  5' 3"  (160 cm)  BEHAVIORAL SYMPTOMS/MOOD NEUROLOGICAL BOWEL NUTRITION STATUS      Continent Diet (heart healthy/carb modified)  AMBULATORY STATUS COMMUNICATION OF NEEDS Skin   Extensive Assist Verbally Normal                       Personal Care Assistance Level of Assistance  Dressing,Feeding,Bathing Bathing Assistance: Maximum assistance Feeding assistance:  Independent Dressing Assistance: Maximum assistance     Functional Limitations Info  Sight,Hearing,Speech Sight Info: Adequate Hearing Info: Adequate Speech Info: Adequate    SPECIAL CARE FACTORS FREQUENCY  PT (By licensed PT),OT (By licensed OT)     PT Frequency: 5x OT Frequency: 5x            Contractures Contractures Info: Not present    Additional Factors Info  Code Status,Allergies Code Status Info: 5x Allergies Info: 5x           Current Medications (08/29/2020):  This is the current hospital active medication list Current Facility-Administered Medications  Medication Dose Route Frequency Provider Last Rate Last Admin  . 0.9 %  sodium chloride infusion   Intravenous PRN Reubin Milan, MD 10 mL/hr at 08/29/20 0019 250 mL at 08/29/20 0019  . acetaminophen (TYLENOL) tablet 650 mg  650 mg Oral Q6H PRN Reubin Milan, MD       Or  . acetaminophen (TYLENOL) suppository 650 mg  650 mg Rectal Q6H PRN Reubin Milan, MD      . atorvastatin (LIPITOR) tablet 40 mg  40 mg Oral QHS Reubin Milan, MD   40 mg at 08/29/20 0009  . cefTRIAXone (ROCEPHIN) 1 g in sodium chloride 0.9 % 100 mL IVPB  1 g Intravenous Q24H Reubin Milan, MD 200 mL/hr at 08/29/20 0020 1 g at 08/29/20 0020  . enoxaparin (LOVENOX) injection 40 mg  40 mg Subcutaneous Q24H Dallie Piles, RPH   40 mg at 08/28/20 1733  . insulin aspart (novoLOG) injection 0-15 Units  0-15 Units Subcutaneous TID WC Reubin Milan, MD   11 Units at 08/29/20 [phone]  .  insulin detemir (LEVEMIR) injection 20 Units  20 Units Subcutaneous BID Mariel Aloe, MD      . magnesium gluconate (MAGONATE) tablet 500 mg  500 mg Oral BID Reubin Milan, MD   500 mg at 08/29/20 0841  . magnesium sulfate IVPB 1 g 100 mL  1 g Intravenous Once Reubin Milan, MD      . metoprolol tartrate (LOPRESSOR) tablet 25 mg  25 mg Oral BID Reubin Milan, MD   25 mg at 08/29/20 0841  . nitroGLYCERIN (NITROSTAT)  SL tablet 0.4 mg  0.4 mg Sublingual Q5 min PRN Reubin Milan, MD      . oxyCODONE (Oxy IR/ROXICODONE) immediate release tablet 5 mg  5 mg Oral Q4H PRN Sharion Settler, NP   5 mg at 08/29/20 0841  . pantoprazole (PROTONIX) EC tablet 40 mg  40 mg Oral Daily Reubin Milan, MD   40 mg at 08/29/20 0841  . potassium chloride SA (KLOR-CON) CR tablet 40 mEq  40 mEq Oral Once Reubin Milan, MD      . prochlorperazine (COMPAZINE) injection 5 mg  5 mg Intravenous Q4H PRN Reubin Milan, MD      . sertraline (ZOLOFT) tablet 50 mg  50 mg Oral Daily Reubin Milan, MD   50 mg at 08/29/20 6301     Discharge Medications: Please see discharge summary for a list of discharge medications.  Relevant Imaging Results:  Relevant Lab Results:   Additional Information [id number]  Gerrianne Scale Justice Milliron, LCSW

## 2020-08-29 NOTE — Progress Notes (Signed)
Inpatient Diabetes Program Recommendations  AACE/ADA: New Consensus Statement on Inpatient Glycemic Control (2015)  Target Ranges:  Prepandial:   less than 140 mg/dL      Peak postprandial:   less than 180 mg/dL (1-2 hours)      Critically ill patients:  140 - 180 mg/dL   Results for [name], [name] (MRN [id number]) as of 08/29/2020 07:58  Ref. Range 08/28/2020 08:34 08/28/2020 11:52 08/28/2020 16:32 08/28/2020 20:30 08/28/2020 23:58  Glucose-Capillary Latest Ref Range: 70 - 99 mg/dL 194 (H)  3 units NOVOLOG  15 units LEVEMIR  245 (H)  5 units NOVOLOG  131 (H)  2 units NOVOLOG  128 (H) 169 (H)     15 units LEVEMIR   Results for [name], [name] (MRN [id number]) as of 08/29/2020 07:58  Ref. Range 08/29/2020 07:48  Glucose-Capillary Latest Ref Range: 70 - 99 mg/dL 326 (H)    Home DM Meds: Levemir 30 units BID  Current Orders: Levemir 15 units BID      Novolog 0-15 units TID     MD- Note that AM CBG today elevated to 326  Please consider increasing pt's Levemir dose to 20 units BID (takes 30 units BID at home)     --Will follow patient during hospitalization--  Wyn Quaker RN, MSN, CDE Diabetes Coordinator Inpatient Glycemic Control Team Team Pager: [phone] (8a-5p)

## 2020-08-30 ENCOUNTER — Ambulatory Visit: Payer: Medicare HMO | Admitting: Primary Care

## 2020-08-30 LAB — GLUCOSE, CAPILLARY
Glucose-Capillary: 282 mg/dL — ABNORMAL HIGH (ref 70–99)
Glucose-Capillary: 197 mg/dL — ABNORMAL HIGH (ref 70–99)
Glucose-Capillary: 173 mg/dL — ABNORMAL HIGH (ref 70–99)
Glucose-Capillary: 227 mg/dL — ABNORMAL HIGH (ref 70–99)

## 2020-08-30 LAB — T4, FREE: Free T4: 0.73 ng/dL (ref 0.61–1.12)

## 2020-08-30 MED ORDER — INSULIN DETEMIR 100 UNIT/ML ~~LOC~~ SOLN
30.0000 [IU] | Freq: Two times a day (BID) | SUBCUTANEOUS | Status: DC
  Administered 2020-08-30 – 2020-08-31 (×2): 30 [IU] via SUBCUTANEOUS
  Filled 2020-08-30 (×5): qty 0.3

## 2020-08-30 MED ORDER — METHOCARBAMOL 500 MG PO TABS
500.0000 mg | ORAL_TABLET | Freq: Three times a day (TID) | ORAL | Status: DC | PRN
  Administered 2020-08-30 – 2020-08-31 (×2): 500 mg via ORAL
  Filled 2020-08-30 (×2): qty 1

## 2020-08-30 MED ORDER — SODIUM CHLORIDE 0.9 % IV BOLUS
1000.0000 mL | Freq: Once | INTRAVENOUS | Status: AC
  Administered 2020-08-30: 14:00:00 1000 mL via INTRAVENOUS

## 2020-08-30 NOTE — Progress Notes (Addendum)
Physical Therapy Treatment Patient Details Name: [name] MRN: [id number] DOB: 1956-01-31 Today's Date: 08/30/2020    History of Present Illness 64 y.o. female with medical history significant of seasonal allergies, anginal pain, anxiety, depression, liver cirrhosis due to steatohepatitis, uterine cancer, degenerative disc disease, diverticulosis/diverticulitis, gastroparesis, GERD/hiatal hernia, pancreatitis, hypertension, hypothyroidism due to amiodarone, history of pneumonia, history of urolithiasis, history of other nonhemorrhagic CVA with mild left-sided hemiparesis, type 1 diabetes, paroxysmal atrial fibrillation, sick sinus syndrome, orthostatic hypotension who is coming to the emergency department via EMS due to having AMS and history of at least 3 falls at home.    PT Comments    Pt ready for session.  Stated she has been getting up to commode but still getting dizzy with nursing and does not feel safe being left unattended.  Participated in exercises as described below.  She states she does not want to go to rehab and prefers to go home. BP supine 142/65 P 82.   To EOB with supervision and reports some dizziness upon sitting 138/58 P 85.  Does not clear with time.  She stands to RW with min a x 1 with increasing 8-9/10 dizziness and obvious increasing discomfort/nervousness in standing.  She is unable to remain standing for 3 minute BP measurement.  Once sitting BP 133/75 P 85.  Stated dizziness is relieved with sitting.  She does not want to sit in the recliner despite encouragement to do so and BP stable in sitting but  wishes to return to bed.  Discussed discharge plan.  She stated she lives with husband and daughter/grandson who helps her.  She stated she would feel comfortable going home with their help as she has a walker and cane at home and family helps her occasionally.  She stated she would feel comfortable walking at home but did not feel ok with it here.  Discussed  strategies for management of dizziness including option on wheelchair for household mobility/idenpendance which she was not interested in at this time.  Encouraged Bedside commode near bed/chair "Oh, that's a good idea." when dizziness limited gait safety and at night as she stated she would not wake her husband to assist her.  Pt did voice her daughter "wouldn't let her do anything", out of concern of dizziness/falls.  Agreed with daughter. Pt stated she just wants to go to kitchen and get a snack etc but daughter limits here.  When reminded that she did not feel safe walking her and how that would change at home she had no answer.    Concerns remain over pt's strategies for management of orthostatic symptoms at home.  She has been educated and solutions offered whether or not they are accepted upon discharge.  Recommend +1 assist at all times if discharged home.  A wheelchair would be a safe form of mobility in the home to allow for increased independence if pt allows.    Patient suffers from Orthostatic Hypotension which impairs his/her ability to perform daily activities like toileting, feeding, dressing, grooming, bathing in the home. A cane, walker, crutch will not resolve the patient's issue with performing activities of daily living. A lightweight wheelchair and cushion is required/recommended and will allow patient to safely perform daily activities.   Patient can safely propel the wheelchair in the home or has a caregiver who can provide assistance.    Follow Up Recommendations  SNF     Equipment Recommendations  Rolling walker with 5" wheels    Recommendations for  Other Services       Precautions / Restrictions Precautions Precautions: Fall;Other (comment) Precaution Comments: take orthstatics Restrictions Weight Bearing Restrictions: No    Mobility  Bed Mobility Overal bed mobility: Modified Independent Bed Mobility: Supine to Sit;Sit to Supine     Supine to sit: Modified  independent (Device/Increase time) Sit to supine: Modified independent (Device/Increase time)   General bed mobility comments: increased time/effort to perform but no physical assist required  Transfers Overall transfer level: Needs assistance Equipment used: Rolling walker (2 wheeled) Transfers: Sit to/from Stand Sit to Stand: Min guard            Ambulation/Gait             General Gait Details: unable   Stairs             Wheelchair Mobility    Modified Rankin (Stroke Patients Only)       Balance Overall balance assessment: Needs assistance Sitting-balance support: No upper extremity supported;Feet supported Sitting balance-Leahy Scale: Good     Standing balance support: Bilateral upper extremity supported;During functional activity;Single extremity supported Standing balance-Leahy Scale: Fair                              Cognition Arousal/Alertness: Awake/alert Behavior During Therapy: WFL for tasks assessed/performed Overall Cognitive Status: Within Functional Limits for tasks assessed                                        Exercises      General Comments        Pertinent Vitals/Pain Pain Assessment: No/denies pain    Home Living                      Prior Function            PT Goals (current goals can now be found in the care plan section) Progress towards PT goals: Progressing toward goals    Frequency    Min 2X/week      PT Plan      Co-evaluation              AM-PAC PT "6 Clicks" Mobility   Outcome Measure  Help needed turning from your back to your side while in a flat bed without using bedrails?: A Little Help needed moving from lying on your back to sitting on the side of a flat bed without using bedrails?: A Little Help needed moving to and from a bed to a chair (including a wheelchair)?: A Little Help needed standing up from a chair using your arms (e.g., wheelchair  or bedside chair)?: A Little Help needed to walk in hospital room?: A Little Help needed climbing 3-5 steps with a railing? : A Lot 6 Click Score: 17    End of Session Equipment Utilized During Treatment: Gait belt Activity Tolerance: Treatment limited secondary to medical complications (Comment) Patient left: in bed;with call bell/phone within reach;with bed alarm set Nurse Communication: Mobility status PT Visit Diagnosis: Unsteadiness on feet (R26.81);Muscle weakness (generalized) (M62.81)     Time: 9038-3338 PT Time Calculation (min) (ACUTE ONLY): 24 min  Charges:  $Therapeutic Activity: 23-37 mins                    Chesley Noon, PTA 08/30/20, 1:02 PM

## 2020-08-30 NOTE — Progress Notes (Signed)
PROGRESS NOTE    [name]  EHO:[id number] DOB: Jul 15, 1956 DOA: 08/27/2020 PCP: [name], NP   Brief Narrative: [name] is a 64 y.o. female with a history of allergies, anxiety, depression, cirrhosis secondary to steatohepatitis, uterine cancer, diverticulosis, gastroparesis, GERD, pancreatitis, hypertension, hypothyroidism. Patient presented secondary to multiple falls with syncope in addition to altered mental status. Admitted for syncope workup.   Assessment & Plan:   Principal Problem:   Atrial fibrillation with RVR (HCC) Active Problems:   GERD   Essential hypertension   Hypokalemia   Prolonged QT interval   Normochromic normocytic anemia   Type 2 diabetes mellitus with neurologic complication (HCC)   Hyperlipidemia   Hypoglycemia   CKD (chronic kidney disease), stage III (HCC)   Syncope Associated falls. Three episodes per patient. She suffered a head injury with negative CT head. Symptoms concerning for orthostatic hypotension. Recent Transthoracic Echocardiogram from 11/8 was significant for normal EF in addition to no stenosis of aortic valve. Associated incontinence with one episode of syncope. -Telemetry  Orthostatic hypotension Recurrent issue. Patient with improved vitals with exam this morning per PT but still significantly symptomatic. Some elevated supine blood pressure with midodrine -IV fluid bolus -Continue midodrine 5 mg TID; watch BP for stability prior to increasing dose to limit supine hypertension  Paroxysmal atrial fibrillation with RVR Patient is on metoprolol as an outpatient. Episode of RVR on admission which has resolved after one dose of diltiazem IV. Home metoprolol continued. Patient is not currently on anticoagulation as an outpatient although she has a remote history of anticoagulation with Eliquis. Diltiazem not continued. No recurrent tachycardia. -Continue metoprolol -Telemetry  Memory  Impairment Unsure of  etiology but seems to be an aspect of aphasia. Patient did have hypoglycemia prior to admission. Patient's recollection of events appears to be accurate but is unable to remember her name, naming certain objects, her husbands name. CT head unremarkable. MRI brain unremarkable for acute process. Discussed with husband and at most he has noticed that she forgets street names and their home address. -Neurology recommendations: possible post-concussive syndrome; neurology follow-up outpatient for neuropsych testing  Prolonged QTc -Telemetry  Hypoglycemia Resolved with D50 and food. Unsure of etiology but possibly related to insulin dosing. Resolved.  Diabetes mellitus, type 2 Patient is on Levemir 30 units twice daily as an outpatient. Reduced to Levemir 15 units BID on admission. Blood sugar significantly uncontrolled this morning. -Increase to Levemir 30 units BID (home dose) and continue SSI  GERD -Continue Protonix  Essential hypertension Patient is on metoprolol as an outpatient.  Elevated TSH Mild elevation of 7.895. Free T4 obtained and is normal. Possible non-thyroidal illness -total T4, T3  Chronic anemia Stable.  Hyperlipidemia Patient is on Lipitor 40 mg daily as an outpatient. -Continue Lipitor  CKD stage IIIb Baseline creatinine of about 1.4. Stable.  Depression -Continue Zoloft 50 mg daily  History of hypothyroidism Not on medication management. TSH has been normal.  Obesity Body mass index is 30.46 kg/m.   DVT prophylaxis: Lovenox Code Status:   Code Status: Full Code Family Communication: Husband on telephone (7 minutes) Disposition Plan: Discharge home vs SNF likely in 1-2 days pending syncope workup and neurology recommendations   Consultants:   Neurology  Procedures:   None  Antimicrobials:  None    Subjective: Patient reports wanting to leave the hospital and go to stay with her mom and son in [place]. She reports not wanting to go home  with her  husband. She reports no concern for domestic abuse/safety.  Objective: Vitals:   08/29/20 2042 08/30/20 0406 08/30/20 0900 08/30/20 1209  BP: 129/61 (!) 143/64 (!) 131/92 (!) 142/69  Pulse: 94 87 96 81  Resp: 18 18 17 16   Temp:  98.5 F (36.9 C) 98.8 F (37.1 C) 98.2 F (36.8 C)  TempSrc:  Oral Oral Oral  SpO2: 99% 98% 99% 98%  Weight:      Height:        Intake/Output Summary (Last 24 hours) at 08/30/2020 1332 Last data filed at 08/30/2020 8469 Gross per 24 hour  Intake 570.4 ml  Output 1950 ml  Net -1379.6 ml   Filed Weights   08/27/20 1108 08/28/20 0300 08/29/20 0419  Weight: 61.2 kg 77.5 kg 78 kg    Examination:  General exam: Appears calm and comfortable Respiratory system: Clear to auscultation. Respiratory effort normal. Cardiovascular system: S1 & S2 heard, RRR. No murmurs, rubs, gallops or clicks. Gastrointestinal system: Abdomen is nondistended, soft and nontender. No organomegaly or masses felt. Normal bowel sounds heard. Central nervous system: Alert. Cannot tell me her name. States she is in a clinic. Able to tell me her husband's name today. No focal neurological deficits. Musculoskeletal: No edema. No calf tenderness Skin: No cyanosis. No rashes    Data Reviewed: I have personally reviewed following labs and imaging studies  CBC Lab Results  Component Value Date   WBC 5.4 08/28/2020   RBC 3.59 (L) 08/28/2020   HGB 9.5 (L) 08/28/2020   HCT 31.3 (L) 08/28/2020   MCV 87.2 08/28/2020   MCH 26.5 08/28/2020   PLT 152 08/28/2020   MCHC 30.4 08/28/2020   RDW 18.5 (H) 08/28/2020   LYMPHSABS 0.7 07/18/2020   MONOABS 0.7 07/18/2020   EOSABS 0.1 07/18/2020   BASOSABS 0.0 62/95/2841     Last metabolic panel Lab Results  Component Value Date   NA 134 (L) 08/28/2020   K 3.9 08/28/2020   CL 104 08/28/2020   CO2 23 08/28/2020   BUN 16 08/28/2020   CREATININE 1.40 (H) 08/28/2020   GLUCOSE 301 (H) 08/28/2020   GFRNONAA 41 (L) 08/28/2020    GFRAA 49 (L) 02/20/2020   CALCIUM 8.1 (L) 08/28/2020   PHOS 3.3 08/27/2020   PROT 6.7 08/28/2020   ALBUMIN 3.1 (L) 08/28/2020   BILITOT 0.9 08/28/2020   ALKPHOS 129 (H) 08/28/2020   AST 38 08/28/2020   ALT 27 08/28/2020   ANIONGAP 7 08/28/2020    CBG (last 3)  Recent Labs    08/29/20 2102 08/30/20 0800 08/30/20 1210  GLUCAP 157* 282* 197*     GFR: Estimated Creatinine Clearance: 39.1 mL/min (A) (by C-G formula based on SCr of 1.4 mg/dL (H)).  Coagulation Profile: No results for input(s): INR, PROTIME in the last 168 hours.  Recent Results (from the past 240 hour(s))  Resp Panel by RT-PCR (Flu A&B, Covid) Nasopharyngeal Swab     Status: None   Collection Time: 08/27/20  2:29 PM   Specimen: Nasopharyngeal Swab; Nasopharyngeal(NP) swabs in vial transport medium  Result Value Ref Range Status   SARS Coronavirus 2 by RT PCR NEGATIVE NEGATIVE Final    Comment: (NOTE) SARS-CoV-2 target nucleic acids are NOT DETECTED.  The SARS-CoV-2 RNA is generally detectable in upper respiratory specimens during the acute phase of infection. The lowest concentration of SARS-CoV-2 viral copies this assay can detect is 138 copies/mL. A negative result does not preclude SARS-Cov-2 infection and should not be used as the  sole basis for treatment or other patient management decisions. A negative result may occur with  improper specimen collection/handling, submission of specimen other than nasopharyngeal swab, presence of viral mutation(s) within the areas targeted by this assay, and inadequate number of viral copies(<138 copies/mL). A negative result must be combined with clinical observations, patient history, and epidemiological information. The expected result is Negative.  Fact Sheet for Patients:  EntrepreneurPulse.com.au  Fact Sheet for Healthcare Providers:  IncredibleEmployment.be  This test is no t yet approved or cleared by the [place]  FDA and  has been authorized for detection and/or diagnosis of SARS-CoV-2 by FDA under an Emergency Use Authorization (EUA). This EUA will remain  in effect (meaning this test can be used) for the duration of the COVID-19 declaration under Section 564(b)(1) of the Act, 21 U.S.C.section 360bbb-3(b)(1), unless the authorization is terminated  or revoked sooner.       Influenza A by PCR NEGATIVE NEGATIVE Final   Influenza B by PCR NEGATIVE NEGATIVE Final    Comment: (NOTE) The Xpert Xpress SARS-CoV-2/FLU/RSV plus assay is intended as an aid in the diagnosis of influenza from Nasopharyngeal swab specimens and should not be used as a sole basis for treatment. Nasal washings and aspirates are unacceptable for Xpert Xpress SARS-CoV-2/FLU/RSV testing.  Fact Sheet for Patients: EntrepreneurPulse.com.au  Fact Sheet for Healthcare Providers: IncredibleEmployment.be  This test is not yet approved or cleared by the [place] FDA and has been authorized for detection and/or diagnosis of SARS-CoV-2 by FDA under an Emergency Use Authorization (EUA). This EUA will remain in effect (meaning this test can be used) for the duration of the COVID-19 declaration under Section 564(b)(1) of the Act, 21 U.S.C. section 360bbb-3(b)(1), unless the authorization is terminated or revoked.  Performed at [facility], [address] [place], [place] [address]         Radiology Studies: MR BRAIN WO CONTRAST  Result Date: 08/28/2020 CLINICAL DATA:  Initial evaluation for memory loss. EXAM: MRI HEAD WITHOUT CONTRAST TECHNIQUE: Multiplanar, multiecho pulse sequences of the brain and surrounding structures were obtained without intravenous contrast. COMPARISON:  Prior head CT from 08/27/2020 FINDINGS: Brain: Examination degraded by motion artifact. Cerebral volume within normal limits for age. Patchy T2/FLAIR hyperintensity within the periventricular deep  white matter both cerebral hemispheres as well as the pons, nonspecific, but most like related chronic microvascular ischemic disease, mild to moderate in nature. No abnormal foci of restricted diffusion to suggest acute or subacute ischemia. Gray-white matter differentiation maintained. No encephalomalacia to suggest chronic cortical infarction. No foci of susceptibility artifact to suggest acute or chronic intracranial hemorrhage. No mass lesion, midline shift or mass effect. No hydrocephalus or extra-axial fluid collection. Pituitary gland suprasellar region within normal limits. Midline structures intact. Vascular: Major intracranial vascular flow voids are maintained. Skull and upper cervical spine: Craniocervical junction within normal limits. Bone marrow signal intensity normal. No scalp soft tissue abnormality. Sinuses/Orbits: Globes and orbital soft tissues within normal limits. Paranasal sinuses are largely clear. Left mastoid and middle ear effusion, of uncertain significance. Visualized nasopharynx within normal limits. Inner ear structures grossly normal. Other: None. IMPRESSION: 1. No acute intracranial abnormality. 2. Mild to moderate chronic microvascular ischemic disease for age. 3. Left mastoid and middle ear effusion, of uncertain significance. Correlation with physical exam recommended. Electronically Signed   By: Jeannine Boga M.D.   On: 08/28/2020 23:36        Scheduled Meds:  atorvastatin  40 mg Oral QHS   enoxaparin (  LOVENOX) injection  40 mg Subcutaneous Q24H   insulin aspart  0-15 Units Subcutaneous TID WC   insulin detemir  20 Units Subcutaneous BID   magnesium gluconate  500 mg Oral BID   metoprolol tartrate  25 mg Oral BID   midodrine  2.5 mg Oral TID WC   pantoprazole  40 mg Oral Daily   potassium chloride  40 mEq Oral Once   sertraline  50 mg Oral Daily   Continuous Infusions:  sodium chloride Stopped (08/29/20 2351)   cefTRIAXone (ROCEPHIN)  IV  Stopped (08/29/20 2315)   magnesium sulfate bolus IVPB     sodium chloride       LOS: 2 days     Cordelia Poche, MD [facility] 08/30/2020, 1:32 PM  If 7PM-7AM, please contact night-coverage [email or web address]

## 2020-08-30 NOTE — Progress Notes (Signed)
Occupational Therapy Treatment Patient Details Name: [name] MRN: [id number] DOB: 1956/08/18 Today's Date: 08/30/2020    History of present illness 64 y.o. female with medical history significant of seasonal allergies, anginal pain, anxiety, depression, liver cirrhosis due to steatohepatitis, uterine cancer, degenerative disc disease, diverticulosis/diverticulitis, gastroparesis, GERD/hiatal hernia, pancreatitis, hypertension, hypothyroidism due to amiodarone, history of pneumonia, history of urolithiasis, history of other nonhemorrhagic CVA with mild left-sided hemiparesis, type 1 diabetes, paroxysmal atrial fibrillation, sick sinus syndrome, orthostatic hypotension who is coming to the emergency department via EMS due to having AMS and history of at least 3 falls at home.   OT comments  Pt seen for OT tx this date. Pt pleasant, oriented x3 (an improvement from previous sessions) and agreeable to participate. Pt performed bed mobility with modified independence, CGA for ADL transfers with cues for RW mgt/hand positioning, and falls prevention strategies. Pt continues to experience significant dizziness with positional changes, worsening in standing and only improves (but still there) once returned to supine. Unable to attempt ADL mobility away from EOB this date 2/2 dizziness. BP noted in vitals section of flowsheets. Pt also noting that she has been experiencing L ear impaired hearing for past 3-4 days but has not remembered until now to tell staff. CM, RN, and MD notified. Pt educated in falls risk and OT recommendation for SNF based on safety risks 2/2 dizziness/BP issues. Should pt continue to decline SNF, she will benefit from w/c for in home mobility and will require supervision/assist for all transfer attempts.   Patient suffers from Orthostatic Hypotension which impairs his/her ability to perform daily activities like toileting, feeding, dressing, grooming, bathing in the home. A cane,  walker, crutch will not resolve the patient's issue with performing activities of daily living. A lightweight wheelchair and cushion is required/recommended and will allow patient to safely perform daily activities.  Patient can safely propel the wheelchair in the home or has a caregiver who can provide assistance.    Follow Up Recommendations  SNF;Other (comment);Supervision - Intermittent (close supervision for all OOB)    Equipment Recommendations  Wheelchair (measurements OT);Wheelchair cushion (measurements OT)    Recommendations for Other Services      Precautions / Restrictions Precautions Precautions: Fall;Other (comment) Precaution Comments: take orthstatics Restrictions Weight Bearing Restrictions: No       Mobility Bed Mobility Overal bed mobility: Modified Independent Bed Mobility: Supine to Sit;Sit to Supine     Supine to sit: Modified independent (Device/Increase time) Sit to supine: Modified independent (Device/Increase time)   General bed mobility comments: increased time/effort to perform but no physical assist required  Transfers Overall transfer level: Needs assistance Equipment used: Rolling walker (2 wheeled) Transfers: Sit to/from Stand Sit to Stand: Min guard         General transfer comment: cues for scooting forward and hand placement    Balance Overall balance assessment: Needs assistance Sitting-balance support: No upper extremity supported;Feet supported Sitting balance-Leahy Scale: Good     Standing balance support: Bilateral upper extremity supported;During functional activity;Single extremity supported Standing balance-Leahy Scale: Fair                             ADL either performed or assessed with clinical judgement   ADL  Vision       Perception     Praxis      Cognition Arousal/Alertness: Awake/alert Behavior During Therapy: WFL for  tasks assessed/performed Overall Cognitive Status: No family/caregiver present to determine baseline cognitive functioning                                 General Comments: alert and oriented x3 today, recalls therapist, continues to demo small but noted improvement in cognition        Exercises Other Exercises Other Exercises: Functional transfer/RW mgt training, falls prevention given dizziness with positional changes   Shoulder Instructions       General Comments supine BP 167/82 HR 86, sitting 164/72 HR 85 and dizzy, standing 137/67 HR 90 more dizzy, and standing 40mn more dizzy 146/76, HR 89. RN/MD notified.    Pertinent Vitals/ Pain       Pain Assessment: 0-10 Pain Score: 8  Pain Location: back and neck Pain Descriptors / Indicators: Aching Pain Intervention(s): Limited activity within patient's tolerance;Monitored during session;Repositioned;Patient requesting pain meds-RN notified  Home Living                                          Prior Functioning/Environment              Frequency  Min 2X/week        Progress Toward Goals  OT Goals(current goals can now be found in the care plan section)  Progress towards OT goals: Not progressing toward goals - comment (dizziness/BP issues remains biggest limiting factor to safety/falls risk)  Acute Rehab OT Goals Patient Stated Goal: to get better OT Goal Formulation: With patient Time For Goal Achievement: 09/11/20 Potential to Achieve Goals: Good  Plan Discharge plan remains appropriate;Frequency remains appropriate    Co-evaluation                 AM-PAC OT "6 Clicks" Daily Activity     Outcome Measure   Help from another person eating meals?: None Help from another person taking care of personal grooming?: None Help from another person toileting, which includes using toliet, bedpan, or urinal?: A Little Help from another person bathing (including washing, rinsing,  drying)?: A Little Help from another person to put on and taking off regular upper body clothing?: None Help from another person to put on and taking off regular lower body clothing?: A Little 6 Click Score: 21    End of Session Equipment Utilized During Treatment: Gait belt;Rolling walker  OT Visit Diagnosis: Other abnormalities of gait and mobility (R26.89);Repeated falls (R29.6);Muscle weakness (generalized) (M62.81)   Activity Tolerance Treatment limited secondary to medical complications (Comment) (dizziness with positional changes)   Patient Left in bed;with call bell/phone within reach;with bed alarm set   Nurse Communication Mobility status;Other (comment);Patient requests pain meds (dizzines)        Time: 13790-2409OT Time Calculation (min): 27 min  Charges: OT General Charges $OT Visit: 1 Visit OT Treatments $Self Care/Home Management : 23-37 mins  JJeni Salles MPH, MS, OTR/L ascom [phone]/21/21, 4:13 PM

## 2020-08-30 NOTE — TOC Progression Note (Addendum)
Transition of Care [facility]) - Progression Note    Patient Details  Name: [name] MRN: [id number] Date of Birth: 1956/08/04  Transition of [facility] CM/SW [place], LCSW Phone Number: 08/30/2020, 8:49 AM  Clinical Narrative: No bed offers this morning. Two denials and three still pending.    9:59 am: Per MD, patient wants to go home with her mother and son rather than going to SNF. MD confirmed she does have capacity. CSW met with patient. Her son, Harrell Gave, lives in [place] and wants her to come stay with them. Patient confirmed she does not want to go to SNF. CSW told patient that we would need to contact her son for his address, get home health set up, etc and she stated she would just go back home with her husband for now. CSW sent secure chat to MD to notify. Will see who can accept her for home health.  1:34 pm: [facility] and [facility] unable to accept. [facility] can accept referral but will be unable to start services until after Christmas. Will call around to other agencies to see if anyone can see her sooner.  Expected Discharge Plan: [place] Barriers to Discharge: Continued Medical Work up  Expected Discharge Plan and Services Expected Discharge Plan: [place] In-house Referral: Clinical Social Work   Post Acute Care Choice: [place] Living arrangements for the past 2 months: [place] Determinants of Health (SDOH) Interventions    Readmission Risk Interventions Readmission Risk Prevention Plan 10/01/2019 07/18/2019 07/16/2019  Transportation Screening Complete Complete Complete  Medication Review Press photographer) Complete Complete Complete  PCP or Specialist appointment within 3-5 days of discharge Complete Complete -  [place] or Home Care Consult Complete Complete Complete  SW Recovery Care/Counseling Consult Complete Complete -  Palliative Care  Screening Not Applicable Not Applicable Not [place] - Not Applicable Patient Refused  Some recent data might be hidden

## 2020-08-31 DIAGNOSIS — I4891 Unspecified atrial fibrillation: Secondary | ICD-10-CM

## 2020-08-31 LAB — CBC
HCT: 28.8 % — ABNORMAL LOW (ref 36.0–46.0)
Hemoglobin: 8.7 g/dL — ABNORMAL LOW (ref 12.0–15.0)
MCH: 26 pg (ref 26.0–34.0)
MCHC: 30.2 g/dL (ref 30.0–36.0)
MCV: 86.2 fL (ref 80.0–100.0)
Platelets: 150 10*3/uL (ref 150–400)
RBC: 3.34 MIL/uL — ABNORMAL LOW (ref 3.87–5.11)
RDW: 17.8 % — ABNORMAL HIGH (ref 11.5–15.5)
WBC: 3.7 10*3/uL — ABNORMAL LOW (ref 4.0–10.5)
nRBC: 0 % (ref 0.0–0.2)

## 2020-08-31 LAB — T3: T3, Total: 100 ng/dL (ref 71–180)

## 2020-08-31 LAB — BASIC METABOLIC PANEL
Anion gap: 9 (ref 5–15)
BUN: 20 mg/dL (ref 8–23)
CO2: 21 mmol/L — ABNORMAL LOW (ref 22–32)
Calcium: 8.1 mg/dL — ABNORMAL LOW (ref 8.9–10.3)
Chloride: 104 mmol/L (ref 98–111)
Creatinine, Ser: 1.23 mg/dL — ABNORMAL HIGH (ref 0.44–1.00)
GFR, Estimated: 48 mL/min — ABNORMAL LOW (ref >60)
Glucose, Bld: 263 mg/dL — ABNORMAL HIGH (ref 70–99)
Potassium: 4.2 mmol/L (ref 3.5–5.1)
Sodium: 134 mmol/L — ABNORMAL LOW (ref 135–145)

## 2020-08-31 LAB — GLUCOSE, CAPILLARY
Glucose-Capillary: 297 mg/dL — ABNORMAL HIGH (ref 70–99)
Glucose-Capillary: 174 mg/dL — ABNORMAL HIGH (ref 70–99)

## 2020-08-31 LAB — T4: T4, Total: 5 ug/dL (ref 4.5–12.0)

## 2020-08-31 MED ORDER — MECLIZINE HCL 25 MG PO TABS: 25.0000 mg | ORAL_TABLET | Freq: Two times a day (BID) | ORAL | 0 refills | Status: DC | PRN

## 2020-08-31 MED ORDER — MECLIZINE HCL 25 MG PO TABS
25.0000 mg | ORAL_TABLET | Freq: Two times a day (BID) | ORAL | Status: DC | PRN
  Filled 2020-08-31: qty 1

## 2020-08-31 MED ORDER — MIDODRINE HCL 2.5 MG PO TABS: 2.5000 mg | ORAL_TABLET | Freq: Two times a day (BID) | ORAL | 0 refills | Status: DC

## 2020-08-31 NOTE — Progress Notes (Signed)
Patient is ready for discharge. Discharge instruction given. IV removed - skin dry and intact. All questions answered. Escorted via wheelchair.

## 2020-08-31 NOTE — Progress Notes (Signed)
Physical Therapy Treatment Patient Details Name: [name] MRN: [id number] DOB: 06-13-1956 Today's Date: 08/31/2020    History of Present Illness 64 y.o. female with medical history significant of seasonal allergies, anginal pain, anxiety, depression, liver cirrhosis due to steatohepatitis, uterine cancer, degenerative disc disease, diverticulosis/diverticulitis, gastroparesis, GERD/hiatal hernia, pancreatitis, hypertension, hypothyroidism due to amiodarone, history of pneumonia, history of urolithiasis, history of other nonhemorrhagic CVA with mild left-sided hemiparesis, type 1 diabetes, paroxysmal atrial fibrillation, sick sinus syndrome, orthostatic hypotension who is coming to the emergency department via EMS due to having AMS and history of at least 3 falls at home.    PT Comments    Pt was long sitting in bed upon arriving. She agrees to session and is cooperative throughout.  Resting vitals: BP 129/72, HR 84, sao2 97% on rm air     BP upon sitting up EOB: 129/64     BP upon standing: 152/89 No c/o dizziness throughout session. Pt unwilling to ambulate out of room however did perform mobility in room and at EOB. Pt is eager to DC to home with daughter/grandson. She will benefit from HHPT at DC to improve safety while improving strength. Pt was unwilling to sit up in recliner. She returned to bed with bed alarm set, call bell in reach and pt resting comfortably.   Follow Up Recommendations  Home health PT;Supervision for mobility/OOB;Supervision/Assistance - 24 hour     Equipment Recommendations  Rolling walker with 5" wheels    Recommendations for Other Services       Precautions / Restrictions Precautions Precautions: Fall Precaution Comments: take orthstatics Restrictions Weight Bearing Restrictions: No    Mobility  Bed Mobility Overal bed mobility: Modified Independent       Transfers Overall transfer level: Needs assistance Equipment used: Rolling walker (2  wheeled) Transfers: Sit to/from Stand Sit to Stand: Supervision         General transfer comment: no physical assistance to perform  Ambulation/Gait Ambulation/Gait assistance: Min guard;Supervision Gait Distance (Feet): 25 Feet Assistive device: Rolling walker (2 wheeled) Gait Pattern/deviations: WFL(Within Functional Limits) Gait velocity: decreased   General Gait Details: Pt was able to ambulate in room ~ 25 ft with RW + CGA at first progressing to supervision only         Balance Overall balance assessment: Modified Independent       Cognition Arousal/Alertness: Awake/alert Behavior During Therapy: WFL for tasks assessed/performed Overall Cognitive Status: No family/caregiver present to determine baseline cognitive functioning          General Comments: Pt is A and O x 3 and agreeable to PT session         General Comments General comments (skin integrity, edema, etc.): no dizziness or BP concerns this session      Pertinent Vitals/Pain Pain Assessment: No/denies pain           PT Goals (current goals can now be found in the care plan section) Acute Rehab PT Goals Patient Stated Goal: go home Progress towards PT goals: Progressing toward goals    Frequency    Min 2X/week      PT Plan Discharge plan needs to be updated       AM-PAC PT "6 Clicks" Mobility   Outcome Measure  Help needed turning from your back to your side while in a flat bed without using bedrails?: None Help needed moving from lying on your back to sitting on the side of a flat bed without using bedrails?: None Help  needed moving to and from a bed to a chair (including a wheelchair)?: None Help needed standing up from a chair using your arms (e.g., wheelchair or bedside chair)?: A Little Help needed to walk in hospital room?: A Little Help needed climbing 3-5 steps with a railing? : A Little 6 Click Score: 21    End of Session Equipment Utilized During Treatment: Gait  belt Activity Tolerance: Patient tolerated treatment well Patient left: in bed;with call bell/phone within reach;with bed alarm set Nurse Communication: Mobility status PT Visit Diagnosis: Unsteadiness on feet (R26.81);Muscle weakness (generalized) (M62.81)     Time: 7737-3668 PT Time Calculation (min) (ACUTE ONLY): 24 min  Charges:  $Therapeutic Activity: 23-37 mins                     Julaine Fusi PTA 08/31/20, 11:35 AM

## 2020-08-31 NOTE — Progress Notes (Signed)
Mobility Specialist - Progress Note   08/31/20 1130  Mobility  Activity Stood at bedside;Dangled on edge of bed  Level of Assistance Contact guard assist, steadying assist  Assistive Device None (2-hand assist, CGA)  Distance Ambulated (ft) 0 ft  Mobility Response Tolerated well  Mobility performed by Mobility specialist  $Mobility charge 1 Mobility    Pre-mobility: 78 HR, 98% SpO2, 142/66 Supine BP During mobility: 81 HR, 96% SpO2, 140/60 EOB BP Post-mobility: 78 HR, 98% SpO2, 112/61 Standing BP   Pt was lying in bed upon arrival utilizing room air. Pt agreed to session. Pt c/o fatigue and pain in lower neck area that continues down to lower back. Pt rated this pain a 8/10. Pt's BP was taken while supine, EOB, and standing. Measurements recorded above. Pt was able to get EOB with no physical assistance. Upon sitting, pt c/o dizziness but stated "it goes away if I just sit here for a minute". Pt then stood with 2-hand assist from mobility tech. Noted very mild LOB d/t dizziness worsening upon standing, but easily recovered with CGA. Further mobility deferred. Pt sat back EOB, dizziness unresolved. Pt was able to return to supine with supervision. Overall, pt tolerated session well. Pt was left in bed with all needs in reach and alarm set. Nurse notified.    Kathee Delton Mobility Specialist 08/31/20, 11:38 AM

## 2020-08-31 NOTE — Care Management Important Message (Signed)
Important Message  Patient Details  Name: [name] MRN: [id number] Date of Birth: May 24, 1956   Medicare Important Message Given:  Yes     [name] 08/31/2020, 10:42 AM

## 2020-08-31 NOTE — TOC Transition Note (Addendum)
Transition of Care [facility]) - CM/SW Discharge Note   Patient Details  Name: [name] MRN: [id number] Date of Birth: 11-Jan-1956  [facility] CM/SW Contact:  Candie Chroman, LCSW Phone Number: 08/31/2020, 12:27 PM   Clinical Narrative: Patient has orders to discharge home today. Told her that [facility] can accept her for PT, OT, RN, aide, SW. She chose [facility]. Patient agreeable to DME recommendation for a lightweight wheelchair. [place] representative to order. No further concerns. CSW signing off.    1:45 pm: Patient does not want to lose her ride waiting on wheelchair. She is agreeable to them shipping it to her house today or tomorrow. Signing off.  Final next level of care: [place] Barriers to Discharge: Barriers Resolved   Patient Goals and CMS Choice Patient states their goals for this hospitalization and ongoing recovery are:: to get better   Choice offered to / list presented to : Patient  Discharge Placement                    Patient and family notified of of transfer: 08/31/20  Discharge Plan and Services In-house Referral: Clinical Social Work   Post Acute Care Choice: [place]          DME Arranged: Youth worker wheelchair with seat cushion DME Agency: [employer] Date DME Agency Contacted: 08/31/20   Representative spoke with at DME Agency: Nash Shearer [facility] Arranged: RN,PT,OT,Nurse's Aide,Social Work [facility] Agency: [facility] Date [place] Agency Contacted: 08/31/20   Representative spoke with at [place]: Jana Half  Social Determinants of Health ([place]) Interventions     Readmission Risk Interventions Readmission Risk Prevention Plan 10/01/2019 07/18/2019 07/16/2019  Transportation Screening Complete Complete Complete  Medication Review Press photographer) Complete Complete Complete  PCP or Specialist appointment within 3-5 days of discharge Complete Complete -   [place] or Home Care Consult Complete Complete Complete  SW Recovery Care/Counseling Consult Complete Complete -  Palliative Care Screening Not Applicable Not Applicable [place] - Not Applicable Patient Refused  Some recent data might be hidden

## 2020-08-31 NOTE — Discharge Summary (Signed)
Physician Discharge Summary  [name] QDI:[id number] DOB: Apr 18, 1956 DOA: 08/27/2020  PCP: [name], NP  Admit date: 08/27/2020 Discharge date: 08/31/2020  Admitted From: Home Disposition:  Home  Recommendations for Outpatient Follow-up:  1. Follow up with PCP in 1-2 weeks 2. Please obtain BMP/CBC in one week 3. Please follow up on the following pending results:None  Home Health:Yes Equipment/Devices: Rolling walker, wheelchair Discharge Condition: Stable CODE STATUS: Full Diet recommendation: Heart Healthy / Carb Modified   Brief/Interim Summary: [name] is a 64 y.o. female with a history of allergies, anxiety, depression, cirrhosis secondary to steatohepatitis, uterine cancer, diverticulosis, gastroparesis, GERD, pancreatitis, hypertension, hypothyroidism. Patient presented secondary to multiple falls with syncope in addition to altered mental status. Admitted for syncope workup. Patient was also found to have positive orthostatic vitals. Blood pressure improved with IV fluid. She was also started on low-dose midodrine. PT evaluated her and recommended SNF placement. Patient's family decided to take her back with home health services which were ordered. On the day of discharge patient was doing well and not becoming dizziness while working with PT. There was also some concern of vertigo so she was given meclizine as needed.  Patient did developed A. fib with RVR. She received IV diltiazem, she was rate controlled on metoprolol on discharge. She will follow-up with her cardiologist.  Patient has some memory impairment and will need further evaluation by her PCP. Imaging which includes CT head and MRI was without any acute abnormality.  Patient was also found to have mildly elevated TSH with normal free T4. She will need reevaluation by PCP.  She will continue rest of her home meds and follow-up with her primary care provider for further  management.  Discharge Diagnoses:  Principal Problem:   Atrial fibrillation with RVR ([place]) Active Problems:   GERD   Essential hypertension   Hypokalemia   Prolonged QT interval   Normochromic normocytic anemia   Type 2 diabetes mellitus with neurologic complication (HCC)   Hyperlipidemia   Hypoglycemia   CKD (chronic kidney disease), stage III [facility])   Discharge Instructions  Discharge Instructions    Amb referral to [facility]   Complete by: As directed    Diet - low sodium heart healthy   Complete by: As directed    Discharge instructions   Complete by: As directed    It was pleasure taking care of you. You are having multiple prescriptions for oxycodone lasting for 1 week each, that can increase your dizziness and risk of your fall.  Please discuss with your primary care provider and try managing your pain without these medications. You are also being given meclizine to be used only if you become dizzy. Also been started on midodrine to maintain your blood pressure as it was dropping with change in your position which was also responsible for your dizziness and falls. Keep yourself well-hydrated. Please follow-up with your primary care provider closely for further management.   Increase activity slowly   Complete by: As directed      Allergies as of 08/31/2020      Reactions   Aspirin Rash   Codeine Sulfate Rash   Erythromycin Rash   Reaction:  Fever    Prednisone Swelling   Rosiglitazone Maleate Swelling   Tetanus-diphtheria Toxoids Td Rash, Other (See Comments)   Reaction:  Fever       Medication List    TAKE these medications   Accu-Chek Aviva Plus test strip Generic drug: glucose  blood Use as instructed to test blood sugar up to 4 times daily   albuterol 108 (90 Base) MCG/ACT inhaler Commonly known as: VENTOLIN HFA Inhale 2 puffs into the lungs every 4 (four) hours as needed for wheezing or shortness of breath.   atorvastatin 40 MG tablet Commonly  known as: LIPITOR Take 40 mg by mouth at bedtime.   cholecalciferol 25 MCG (1000 UNIT) tablet Commonly known as: VITAMIN D3 Take 1,000 Units by mouth daily.   cyclobenzaprine 10 MG tablet Commonly known as: FLEXERIL Take 1 tablet (10 mg total) by mouth 3 (three) times daily as needed for muscle spasms. Must last 30 days. What changed: when to take this   Levemir FlexTouch 100 UNIT/ML FlexPen Generic drug: insulin detemir INJECT 30 UNITS SUBCUTANEOUSLY EVERY MORNING AND AT BEDTIME   magnesium gluconate 500 MG tablet Commonly known as: MAGONATE Take 500 mg by mouth 2 (two) times daily.   meclizine 25 MG tablet Commonly known as: ANTIVERT Take 1 tablet (25 mg total) by mouth 2 (two) times daily as needed for dizziness.   Melatonin 10 MG Caps Take 1 tablet by mouth at bedtime.   metoprolol tartrate 25 MG tablet Commonly known as: LOPRESSOR Take 25 mg by mouth 2 (two) times daily.   midodrine 2.5 MG tablet Commonly known as: PROAMATINE Take 1 tablet (2.5 mg total) by mouth 2 (two) times daily with a meal.   nitroGLYCERIN 0.3 MG SL tablet Commonly known as: Nitrostat Place 1 tablet (0.3 mg total) under the tongue every 5 (five) minutes as needed for chest pain.   oxyCODONE 5 MG immediate release tablet Commonly known as: Oxy IR/ROXICODONE Take 1 tablet (5 mg total) by mouth 4 (four) times daily for 7 days. Max: 4/day. Must last 7 days. What changed: Another medication with the same name was removed. Continue taking this medication, and follow the directions you see here.   oxyCODONE 5 MG immediate release tablet Commonly known as: Oxy IR/ROXICODONE Take 1 tablet (5 mg total) by mouth 3 (three) times daily for 7 days. Max: 3/day. Must last 7 days. What changed: Another medication with the same name was removed. Continue taking this medication, and follow the directions you see here.   oxyCODONE 5 MG immediate release tablet Commonly known as: Oxy IR/ROXICODONE Take 1  tablet (5 mg total) by mouth 2 (two) times daily for 7 days. Max: 2/day. Must last 7 days. Start taking on: September 07, 2020 What changed: Another medication with the same name was removed. Continue taking this medication, and follow the directions you see here.   oxyCODONE 5 MG immediate release tablet Commonly known as: Oxy IR/ROXICODONE Take 1 tablet (5 mg total) by mouth daily for 7 days. Max: 1/day. Must last 7 days. Start taking on: September 14, 2020 What changed: Another medication with the same name was removed. Continue taking this medication, and follow the directions you see here.   pantoprazole 40 MG tablet Commonly known as: PROTONIX Take 1 tablet (40 mg total) by mouth daily.   sertraline 50 MG tablet Commonly known as: ZOLOFT TAKE ONE TABLET EVERY DAY   Spacer/Aero-Hold Chamber Bags Misc Use spacer with inhaler when you have shortness of breath            Durable Medical Equipment  (From admission, onward)         Start     Ordered   08/31/20 1221  For home use only DME lightweight manual wheelchair with seat cushion  Once       Comments: Patient suffers from generalized weakness with multiple other comorbidities which impairs their ability to perform daily activities like walking around in the home.  A walker will not resolve  issue with performing activities of daily living. A wheelchair will allow patient to safely perform daily activities. Patient is not able to propel themselves in the home using a standard weight wheelchair due to general weakness. Patient can self propel in the lightweight wheelchair. Length of need 12 months . Accessories: elevating leg rests (ELRs), wheel locks, extensions and anti-tippers.   08/31/20 1221   08/31/20 1218  For home use only DME Walker rolling  Once       Question Answer Comment  Walker: With [place]   Patient needs a walker to treat with the following condition Dizziness      08/31/20 1218   08/29/20 0740  For  home use only DME Walker rolling  Once       Question Answer Comment  Walker: With 5 Inch Wheels   Patient needs a walker to treat with the following condition Orthostatic hypotension      08/29/20 0740          Follow-up Information    [facility] Follow [facility].   Specialty: Home Health Services Why: They will follow up with you for your home health needs. Contact information: [name] [place] [address] [phone]        [name], NP. Schedule an appointment as soon as possible for a visit.   Specialty: Internal Medicine Contact information: [place] [address] [phone]        Minna Merritts, MD .   Specialty: Cardiology Contact information: [address] [place] [address] [phone]              Allergies  Allergen Reactions  . Aspirin Rash  . Codeine Sulfate Rash  . Erythromycin Rash    Reaction:  Fever   . Prednisone Swelling  . Rosiglitazone Maleate Swelling  . Tetanus-Diphtheria Toxoids Td Rash and Other (See Comments)    Reaction:  Fever     Consultations:  Neurology  Procedures/Studies: CT HEAD WO CONTRAST  Result Date: 08/27/2020 CLINICAL DATA:  Patient status post fall.  Patient on Eliquis. EXAM: CT HEAD WITHOUT CONTRAST CT CERVICAL SPINE WITHOUT CONTRAST TECHNIQUE: Multidetector CT imaging of the head and cervical spine was performed following the standard protocol without intravenous contrast. Multiplanar CT image reconstructions of the cervical spine were also generated. COMPARISON:  Brain CT 01/23/2020 FINDINGS: CT HEAD FINDINGS Brain: No evidence of acute infarction, hemorrhage, hydrocephalus, extra-axial collection or mass lesion/mass effect. Ventricles and sulci are appropriate patient's age. Vascular: Unremarkable Skull: Intact. Sinuses/Orbits: There is nonspecific opacification of the left mastoid air cells. Right mastoid air cells are well aerated.  Paranasal sinuses are well aerated. Other: None. CT CERVICAL SPINE FINDINGS Alignment: Normal. Skull base and vertebrae: No acute fracture. No primary bone lesion or focal pathologic process. Soft tissues and spinal canal: No prevertebral fluid or swelling. No visible canal hematoma. Disc levels: No acute fracture. Degenerative disc disease most pronounced C5-6. Upper chest: Negative. Other: None IMPRESSION: 1. No acute intracranial process. 2. No acute cervical spine fracture. 3. Nonspecific opacification of the left mastoid air cells. Electronically Signed   By: Lovey Newcomer M.D.   On: 08/27/2020 12:12   CT Cervical Spine Wo Contrast  Result Date: 08/27/2020 CLINICAL DATA:  Patient status post fall.  Patient on Eliquis. EXAM: CT HEAD WITHOUT CONTRAST CT CERVICAL SPINE WITHOUT CONTRAST TECHNIQUE: Multidetector CT imaging of the head and cervical spine was performed following the standard protocol without intravenous contrast. Multiplanar CT image reconstructions of the cervical spine were also generated. COMPARISON:  Brain CT 01/23/2020 FINDINGS: CT HEAD FINDINGS Brain: No evidence of acute infarction, hemorrhage, hydrocephalus, extra-axial collection or mass lesion/mass effect. Ventricles and sulci are appropriate patient's age. Vascular: Unremarkable Skull: Intact. Sinuses/Orbits: There is nonspecific opacification of the left mastoid air cells. Right mastoid air cells are well aerated. Paranasal sinuses are well aerated. Other: None. CT CERVICAL SPINE FINDINGS Alignment: Normal. Skull base and vertebrae: No acute fracture. No primary bone lesion or focal pathologic process. Soft tissues and spinal canal: No prevertebral fluid or swelling. No visible canal hematoma. Disc levels: No acute fracture. Degenerative disc disease most pronounced C5-6. Upper chest: Negative. Other: None IMPRESSION: 1. No acute intracranial process. 2. No acute cervical spine fracture. 3. Nonspecific opacification of the left mastoid  air cells. Electronically Signed   By: Lovey Newcomer M.D.   On: 08/27/2020 12:12   MR BRAIN WO CONTRAST  Result Date: 08/28/2020 CLINICAL DATA:  Initial evaluation for memory loss. EXAM: MRI HEAD WITHOUT CONTRAST TECHNIQUE: Multiplanar, multiecho pulse sequences of the brain and surrounding structures were obtained without intravenous contrast. COMPARISON:  Prior head CT from 08/27/2020 FINDINGS: Brain: Examination degraded by motion artifact. Cerebral volume within normal limits for age. Patchy T2/FLAIR hyperintensity within the periventricular deep white matter both cerebral hemispheres as well as the pons, nonspecific, but most like related chronic microvascular ischemic disease, mild to moderate in nature. No abnormal foci of restricted diffusion to suggest acute or subacute ischemia. Gray-white matter differentiation maintained. No encephalomalacia to suggest chronic cortical infarction. No foci of susceptibility artifact to suggest acute or chronic intracranial hemorrhage. No mass lesion, midline shift or mass effect. No hydrocephalus or extra-axial fluid collection. Pituitary gland suprasellar region within normal limits. Midline structures intact. Vascular: Major intracranial vascular flow voids are maintained. Skull and upper cervical spine: Craniocervical junction within normal limits. Bone marrow signal intensity normal. No scalp soft tissue abnormality. Sinuses/Orbits: Globes and orbital soft tissues within normal limits. Paranasal sinuses are largely clear. Left mastoid and middle ear effusion, of uncertain significance. Visualized nasopharynx within normal limits. Inner ear structures grossly normal. Other: None. IMPRESSION: 1. No acute intracranial abnormality. 2. Mild to moderate chronic microvascular ischemic disease for age. 3. Left mastoid and middle ear effusion, of uncertain significance. Correlation with physical exam recommended. Electronically Signed   By: Jeannine Boga M.D.   On:  08/28/2020 23:36   DG Chest Portable 1 View  Result Date: 08/27/2020 CLINICAL DATA:  Fever, cough. EXAM: PORTABLE CHEST 1 VIEW COMPARISON:  July 18, 2020. FINDINGS: Low lung volumes. Streaky left basilar opacities. No visible pleural effusions or pneumothorax. Mildly enlarged cardiac silhouette. Aortic atherosclerosis. No acute osseous abnormality. IMPRESSION: Streaky left basilar opacities, which could represent atelectasis, aspiration, and/or pneumonia. Dedicated PA and lateral radiographs could further evaluate if clinically indicated. Electronically Signed   By: Margaretha Sheffield MD   On: 08/27/2020 13:17    Subjective: Patient was feeling better when seen during morning rounds. Continues to have mild dizziness from sitting to standing position, she was describing as room spinning but stating that it is much improved and only lasting for couple of seconds.  Discharge Exam: Vitals:   08/31/20 0800 08/31/20 1150  BP: (!) 146/70 138/63  Pulse: 91 79  Resp: 18 16  Temp: 98.2 F (36.8 C) 98 F (36.7 C)  SpO2: 97% 95%   Vitals:   08/30/20 2022 08/31/20 0418 08/31/20 0800 08/31/20 1150  BP: (!) 155/76 136/85 (!) 146/70 138/63  Pulse: 97 90 91 79  Resp: 16 20 18 16   Temp: 99.3 F (37.4 C) 97.9 F (36.6 C) 98.2 F (36.8 C) 98 F (36.7 C)  TempSrc: Oral Oral Oral Oral  SpO2: 96% 100% 97% 95%  Weight:      Height:        General: Pt is alert, awake, not in acute distress Cardiovascular: RRR, S1/S2 +, no rubs, no gallops Respiratory: CTA bilaterally, no wheezing, no rhonchi Abdominal: Soft, NT, ND, bowel sounds + Extremities: no edema, no cyanosis   The results of significant diagnostics from this hospitalization (including imaging, microbiology, ancillary and laboratory) are listed below for reference.    Microbiology: Recent Results (from the past 240 hour(s))  Resp Panel by RT-PCR (Flu A&B, Covid) Nasopharyngeal Swab     Status: None   Collection Time: 08/27/20  2:29  PM   Specimen: Nasopharyngeal Swab; Nasopharyngeal(NP) swabs in vial transport medium  Result Value Ref Range Status   SARS Coronavirus 2 by RT PCR NEGATIVE NEGATIVE Final    Comment: (NOTE) SARS-CoV-2 target nucleic acids are NOT DETECTED.  The SARS-CoV-2 RNA is generally detectable in upper respiratory specimens during the acute phase of infection. The lowest concentration of SARS-CoV-2 viral copies this assay can detect is 138 copies/mL. A negative result does not preclude SARS-Cov-2 infection and should not be used as the sole basis for treatment or other patient management decisions. A negative result may occur with  improper specimen collection/handling, submission of specimen other than nasopharyngeal swab, presence of viral mutation(s) within the areas targeted by this assay, and inadequate number of viral copies(<138 copies/mL). A negative result must be combined with clinical observations, patient history, and epidemiological information. The expected result is Negative.  Fact Sheet for Patients:  EntrepreneurPulse.com.au  Fact Sheet for Healthcare Providers:  IncredibleEmployment.be  This test is no t yet approved or cleared by the [place] FDA and  has been authorized for detection and/or diagnosis of SARS-CoV-2 by FDA under an Emergency Use Authorization (EUA). This EUA will remain  in effect (meaning this test can be used) for the duration of the COVID-19 declaration under Section 564(b)(1) of the Act, 21 U.S.C.section 360bbb-3(b)(1), unless the authorization is terminated  or revoked sooner.       Influenza A by PCR NEGATIVE NEGATIVE Final   Influenza B by PCR NEGATIVE NEGATIVE Final    Comment: (NOTE) The Xpert Xpress SARS-CoV-2/FLU/RSV plus assay is intended as an aid in the diagnosis of influenza from Nasopharyngeal swab specimens and should not be used as a sole basis for treatment. Nasal washings and aspirates are  unacceptable for Xpert Xpress SARS-CoV-2/FLU/RSV testing.  Fact Sheet for Patients: EntrepreneurPulse.com.au  Fact Sheet for Healthcare Providers: IncredibleEmployment.be  This test is not yet approved or cleared by the [place] FDA and has been authorized for detection and/or diagnosis of SARS-CoV-2 by FDA under an Emergency Use Authorization (EUA). This EUA will remain in effect (meaning this test can be used) for the duration of the COVID-19 declaration under Section 564(b)(1) of the Act, 21 U.S.C. section 360bbb-3(b)(1), unless the authorization is terminated or revoked.  Performed at [facility], [address], [place], [place] [address]  Labs: BNP (last 3 results) Recent Labs    07/17/20 0610 08/27/20 1216  BNP 132.0* 353.2*   Basic Metabolic Panel: Recent Labs  Lab 08/25/20 1116 08/27/20 1216 08/27/20 1727 08/28/20 0947 08/31/20 0437  NA 138  --   --  134* 134*  K 2.8*  --   --  3.9 4.2  CL 106  --   --  104 104  CO2 22  --   --  23 21*  GLUCOSE 46*  --   --  301* 263*  BUN 15  --   --  16 20  CREATININE 1.61*  --   --  1.40* 1.23*  CALCIUM 8.9  --   --  8.1* 8.1*  MG  --  2.1  --  2.0  --   PHOS  --   --  3.3  --   --    Liver Function Tests: Recent Labs  Lab 08/28/20 0947  AST 38  ALT 27  ALKPHOS 129*  BILITOT 0.9  PROT 6.7  ALBUMIN 3.1*   No results for input(s): LIPASE, AMYLASE in the last 168 hours. No results for input(s): AMMONIA in the last 168 hours. CBC: Recent Labs  Lab 08/25/20 1116 08/28/20 0947 08/31/20 0437  WBC 7.5 5.4 3.7*  HGB 9.8* 9.5* 8.7*  HCT 31.6* 31.3* 28.8*  MCV 84.5 87.2 86.2  PLT 171 152 150   Cardiac Enzymes: No results for input(s): CKTOTAL, CKMB, CKMBINDEX, TROPONINI in the last 168 hours. BNP: Invalid input(s): POCBNP CBG: Recent Labs  Lab 08/30/20 1210 08/30/20 1647 08/30/20 2120 08/31/20 0755 08/31/20 1151  GLUCAP 197* 173* 227* 297*  174*   D-Dimer No results for input(s): DDIMER in the last 72 hours. Hgb A1c No results for input(s): HGBA1C in the last 72 hours. Lipid Profile No results for input(s): CHOL, HDL, LDLCALC, TRIG, CHOLHDL, LDLDIRECT in the last 72 hours. Thyroid function studies Recent Labs    08/29/20 0758 08/30/20 0832  TSH 7.895*  --   T4TOTAL  --  5.0   Anemia work up Recent Labs    08/29/20 0758  VITAMINB12 413   Urinalysis    Component Value Date/Time   COLORURINE YELLOW (A) 08/27/2020 1800   APPEARANCEUR CLOUDY (A) 08/27/2020 1800   APPEARANCEUR Cloudy (A) 12/02/2019 1259   LABSPEC 1.010 08/27/2020 1800   LABSPEC 1.033 10/08/2014 0144   PHURINE 5.0 08/27/2020 1800   GLUCOSEU 150 (A) 08/27/2020 1800   GLUCOSEU >=500 10/08/2014 0144   HGBUR LARGE (A) 08/27/2020 1800   BILIRUBINUR NEGATIVE 08/27/2020 1800   BILIRUBINUR Negative 12/02/2019 1259   BILIRUBINUR Negative 10/08/2014 0144   KETONESUR NEGATIVE 08/27/2020 1800   PROTEINUR 30 (A) 08/27/2020 1800   UROBILINOGEN 0.2 11/04/2019 1157   UROBILINOGEN 0.2 05/27/2015 1936   NITRITE NEGATIVE 08/27/2020 1800   LEUKOCYTESUR LARGE (A) 08/27/2020 1800   LEUKOCYTESUR Negative 10/08/2014 0144   Sepsis Labs Invalid input(s): PROCALCITONIN,  WBC,  LACTICIDVEN Microbiology Recent Results (from the past 240 hour(s))  Resp Panel by RT-PCR (Flu A&B, Covid) Nasopharyngeal Swab     Status: None   Collection Time: 08/27/20  2:29 PM   Specimen: Nasopharyngeal Swab; Nasopharyngeal(NP) swabs in vial transport medium  Result Value Ref Range Status   SARS Coronavirus 2 by RT PCR NEGATIVE NEGATIVE Final    Comment: (NOTE) SARS-CoV-2 target nucleic acids are NOT DETECTED.  The SARS-CoV-2 RNA is generally detectable in upper respiratory specimens during the acute phase of infection. The lowest concentration of  SARS-CoV-2 viral copies this assay can detect is 138 copies/mL. A negative result does not preclude SARS-Cov-2 infection and should not  be used as the sole basis for treatment or other patient management decisions. A negative result may occur with  improper specimen collection/handling, submission of specimen other than nasopharyngeal swab, presence of viral mutation(s) within the areas targeted by this assay, and inadequate number of viral copies(<138 copies/mL). A negative result must be combined with clinical observations, patient history, and epidemiological information. The expected result is Negative.  Fact Sheet for Patients:  EntrepreneurPulse.com.au  Fact Sheet for Healthcare Providers:  IncredibleEmployment.be  This test is no t yet approved or cleared by the [place] FDA and  has been authorized for detection and/or diagnosis of SARS-CoV-2 by FDA under an Emergency Use Authorization (EUA). This EUA will remain  in effect (meaning this test can be used) for the duration of the COVID-19 declaration under Section 564(b)(1) of the Act, 21 U.S.C.section 360bbb-3(b)(1), unless the authorization is terminated  or revoked sooner.       Influenza A by PCR NEGATIVE NEGATIVE Final   Influenza B by PCR NEGATIVE NEGATIVE Final    Comment: (NOTE) The Xpert Xpress SARS-CoV-2/FLU/RSV plus assay is intended as an aid in the diagnosis of influenza from Nasopharyngeal swab specimens and should not be used as a sole basis for treatment. Nasal washings and aspirates are unacceptable for Xpert Xpress SARS-CoV-2/FLU/RSV testing.  Fact Sheet for Patients: EntrepreneurPulse.com.au  Fact Sheet for Healthcare Providers: IncredibleEmployment.be  This test is not yet approved or cleared by the [place] FDA and has been authorized for detection and/or diagnosis of SARS-CoV-2 by FDA under an Emergency Use Authorization (EUA). This EUA will remain in effect (meaning this test can be used) for the duration of the COVID-19 declaration under Section  564(b)(1) of the Act, 21 U.S.C. section 360bbb-3(b)(1), unless the authorization is terminated or revoked.  Performed at [facility], [address] [place], [place] [address]     Time coordinating discharge: Over 30 minutes  SIGNED:  Lorella Nimrod, MD  [facility] 08/31/2020, 12:36 PM  If 7PM-7AM, please contact night-coverage [email or web address]  This record has been created using Systems analyst. Errors have been sought and corrected,but may not always be located. Such creation errors do not reflect on the standard of care.

## 2020-09-01 ENCOUNTER — Telehealth: Payer: Self-pay

## 2020-09-01 NOTE — Telephone Encounter (Signed)
Noted, will evaluate. 

## 2020-09-01 NOTE — Telephone Encounter (Signed)
Transition Care Management Follow-up Telephone Call  Date of discharge and from where: 08/31/2020, [facility]   How have you been since you were released from the hospital? Spoke with husband, [name], HIPAA verified. Patient is doing much better. Mobility has improved a lot also.   Any questions or concerns? No  Items Reviewed:  Did the pt receive and understand the discharge instructions provided? Yes   Medications obtained and verified? Yes   Other? No   Any new allergies since your discharge? No   Dietary orders reviewed? Yes  Do you have support at home? Yes   Home Care and Equipment/Supplies: Were home health services ordered? yes If so, what is the name of the agency? [facility]  Has the agency set up a time to come to the patient's home? no Were any new equipment or medical supplies ordered?  No What is the name of the medical supply agency? N/A Were you able to get the supplies/equipment? not applicable Do you have any questions related to the use of the equipment or supplies? No  Functional Questionnaire: (I = Independent and D = Dependent) ADLs: I   Bathing/Dressing- I  Meal Prep- I  Eating- I  Maintaining continence- I  Transferring/Ambulation- I  Managing Meds- I  Follow up appointments reviewed:   PCP Hospital f/u appt confirmed? Yes  Scheduled to see Rita Friendly, NP on 09/15/2019 @ 10:20 am.  [facility] f/u appt confirmed? Yes  Scheduled to see Dr. Rockey [name] on 09/20/2019 @ 9 am.  Are transportation arrangements needed? No   If their condition worsens, is the pt aware to call PCP or go to the Emergency Dept.? Yes  Was the patient provided with contact information for the PCP's office or ED? Yes  Was to pt encouraged to call back with questions or concerns? Yes

## 2020-09-04 DIAGNOSIS — N1831 Chronic kidney disease, stage 3a: Secondary | ICD-10-CM | POA: Diagnosis not present

## 2020-09-04 DIAGNOSIS — E1122 Type 2 diabetes mellitus with diabetic chronic kidney disease: Secondary | ICD-10-CM | POA: Diagnosis not present

## 2020-09-04 DIAGNOSIS — M797 Fibromyalgia: Secondary | ICD-10-CM | POA: Diagnosis not present

## 2020-09-04 DIAGNOSIS — D631 Anemia in chronic kidney disease: Secondary | ICD-10-CM | POA: Diagnosis not present

## 2020-09-04 DIAGNOSIS — G894 Chronic pain syndrome: Secondary | ICD-10-CM | POA: Diagnosis not present

## 2020-09-04 DIAGNOSIS — E1149 Type 2 diabetes mellitus with other diabetic neurological complication: Secondary | ICD-10-CM | POA: Diagnosis not present

## 2020-09-04 DIAGNOSIS — I482 Chronic atrial fibrillation, unspecified: Secondary | ICD-10-CM | POA: Diagnosis not present

## 2020-09-04 DIAGNOSIS — I129 Hypertensive chronic kidney disease with stage 1 through stage 4 chronic kidney disease, or unspecified chronic kidney disease: Secondary | ICD-10-CM | POA: Diagnosis not present

## 2020-09-04 DIAGNOSIS — I420 Dilated cardiomyopathy: Secondary | ICD-10-CM | POA: Diagnosis not present

## 2020-09-04 DIAGNOSIS — I2510 Atherosclerotic heart disease of native coronary artery without angina pectoris: Secondary | ICD-10-CM | POA: Diagnosis not present

## 2020-09-07 DIAGNOSIS — M797 Fibromyalgia: Secondary | ICD-10-CM | POA: Diagnosis not present

## 2020-09-07 DIAGNOSIS — D631 Anemia in chronic kidney disease: Secondary | ICD-10-CM | POA: Diagnosis not present

## 2020-09-07 DIAGNOSIS — N1831 Chronic kidney disease, stage 3a: Secondary | ICD-10-CM | POA: Diagnosis not present

## 2020-09-07 DIAGNOSIS — I420 Dilated cardiomyopathy: Secondary | ICD-10-CM | POA: Diagnosis not present

## 2020-09-07 DIAGNOSIS — I129 Hypertensive chronic kidney disease with stage 1 through stage 4 chronic kidney disease, or unspecified chronic kidney disease: Secondary | ICD-10-CM | POA: Diagnosis not present

## 2020-09-07 DIAGNOSIS — I2510 Atherosclerotic heart disease of native coronary artery without angina pectoris: Secondary | ICD-10-CM | POA: Diagnosis not present

## 2020-09-07 DIAGNOSIS — I482 Chronic atrial fibrillation, unspecified: Secondary | ICD-10-CM | POA: Diagnosis not present

## 2020-09-07 DIAGNOSIS — G894 Chronic pain syndrome: Secondary | ICD-10-CM | POA: Diagnosis not present

## 2020-09-07 DIAGNOSIS — E1149 Type 2 diabetes mellitus with other diabetic neurological complication: Secondary | ICD-10-CM | POA: Diagnosis not present

## 2020-09-07 DIAGNOSIS — E1122 Type 2 diabetes mellitus with diabetic chronic kidney disease: Secondary | ICD-10-CM | POA: Diagnosis not present

## 2020-09-12 ENCOUNTER — Other Ambulatory Visit: Payer: Self-pay | Admitting: Primary Care

## 2020-09-12 DIAGNOSIS — F411 Generalized anxiety disorder: Secondary | ICD-10-CM

## 2020-09-14 ENCOUNTER — Inpatient Hospital Stay: Payer: Medicare HMO | Admitting: Primary Care

## 2020-09-14 ENCOUNTER — Ambulatory Visit: Payer: Medicare HMO

## 2020-09-19 ENCOUNTER — Ambulatory Visit: Payer: Medicare HMO | Admitting: Physician Assistant

## 2020-09-20 ENCOUNTER — Ambulatory Visit: Payer: Medicare Other | Attending: Critical Care Medicine

## 2020-09-20 ENCOUNTER — Ambulatory Visit: Payer: Medicare HMO

## 2020-09-20 DIAGNOSIS — Z23 Encounter for immunization: Secondary | ICD-10-CM

## 2020-09-20 NOTE — Progress Notes (Signed)
   Covid-19 Vaccination Clinic  Name:  [name]    MRN: [id number] DOB: November 09, 1955  09/20/2020  [name] was observed post Covid-19 immunization for 15 minutes without incident. She was provided with Vaccine Information Sheet and instruction to access the V-Safe system.   [name] was instructed to call 911 with any severe reactions post vaccine: Marland Kitchen Difficulty breathing  . Swelling of face and throat  . A fast heartbeat  . A bad rash all over body  . Dizziness and weakness   Immunizations Administered    Name Date Dose VIS Date Route   Moderna Covid-19 Booster Vaccine 09/20/2020 11:40 AM 0.25 mL 06/29/2020 Intramuscular   Manufacturer: Moderna   Lot: [id number]   [place]: [id number]

## 2020-09-26 ENCOUNTER — Telehealth: Payer: Self-pay | Admitting: Physician Assistant

## 2020-09-26 NOTE — Telephone Encounter (Signed)
  Patient Consent for Virtual Visit         [name] has provided verbal consent on 09/26/2020 for a virtual visit (video or telephone).   CONSENT FOR VIRTUAL VISIT FOR:  [name]  By participating in this virtual visit I agree to the following:  I hereby voluntarily request, consent and authorize [facility] and its employed or contracted physicians, physician assistants, nurse practitioners or other licensed health care professionals (the Practitioner), to provide me with telemedicine health care services (the "Services") as deemed necessary by the treating Practitioner. I acknowledge and consent to receive the Services by the Practitioner via telemedicine. I understand that the telemedicine visit will involve communicating with the Practitioner through live audiovisual communication technology and the disclosure of certain medical information by electronic transmission. I acknowledge that I have been given the opportunity to request an in-person assessment or other available alternative prior to the telemedicine visit and am voluntarily participating in the telemedicine visit.  I understand that I have the right to withhold or withdraw my consent to the use of telemedicine in the course of my care at any time, without affecting my right to future care or treatment, and that the Practitioner or I may terminate the telemedicine visit at any time. I understand that I have the right to inspect all information obtained and/or recorded in the course of the telemedicine visit and may receive copies of available information for a reasonable fee.  I understand that some of the potential risks of receiving the Services via telemedicine include:  Marland Kitchen Delay or interruption in medical evaluation due to technological equipment failure or disruption; . Information transmitted may not be sufficient (e.g. poor resolution of images) to allow for appropriate medical decision making by the Practitioner;  and/or  . In rare instances, security protocols could fail, causing a breach of personal health information.  Furthermore, I acknowledge that it is my responsibility to provide information about my medical history, conditions and care that is complete and accurate to the best of my ability. I acknowledge that Practitioner's advice, recommendations, and/or decision may be based on factors not within their control, such as incomplete or inaccurate data provided by me or distortions of diagnostic images or specimens that may result from electronic transmissions. I understand that the practice of medicine is not an exact science and that Practitioner makes no warranties or guarantees regarding treatment outcomes. I acknowledge that a copy of this consent can be made available to me via my patient portal ([facility]), or I can request a printed copy by calling the office of [facility].    I understand that my insurance will be billed for this visit.   I have read or had this consent read to me. . I understand the contents of this consent, which adequately explains the benefits and risks of the Services being provided via telemedicine.  . I have been provided ample opportunity to ask questions regarding this consent and the Services and have had my questions answered to my satisfaction. . I give my informed consent for the services to be provided through the use of telemedicine in my medical care

## 2020-09-27 ENCOUNTER — Encounter: Payer: Self-pay | Admitting: Emergency Medicine

## 2020-09-27 ENCOUNTER — Observation Stay: Payer: Medicare Other

## 2020-09-27 ENCOUNTER — Other Ambulatory Visit: Payer: Self-pay

## 2020-09-27 ENCOUNTER — Telehealth (INDEPENDENT_AMBULATORY_CARE_PROVIDER_SITE_OTHER): Payer: Medicare Other | Admitting: Physician Assistant

## 2020-09-27 ENCOUNTER — Encounter: Payer: Self-pay | Admitting: Physician Assistant

## 2020-09-27 ENCOUNTER — Emergency Department: Payer: Medicare Other

## 2020-09-27 ENCOUNTER — Encounter: Payer: Self-pay | Admitting: Internal Medicine

## 2020-09-27 ENCOUNTER — Observation Stay
Admission: EM | Admit: 2020-09-27 | Discharge: 2020-09-29 | Disposition: A | Discharge status: Home / Self Care | Payer: Medicare Other | Attending: Hospitalist | Admitting: Hospitalist

## 2020-09-27 VITALS — Ht 63.0 in | Wt 171.0 lb

## 2020-09-27 DIAGNOSIS — I480 Paroxysmal atrial fibrillation: Secondary | ICD-10-CM | POA: Insufficient documentation

## 2020-09-27 DIAGNOSIS — M6281 Muscle weakness (generalized): Secondary | ICD-10-CM

## 2020-09-27 DIAGNOSIS — E1165 Type 2 diabetes mellitus with hyperglycemia: Secondary | ICD-10-CM | POA: Diagnosis present

## 2020-09-27 DIAGNOSIS — Z87891 Personal history of nicotine dependence: Secondary | ICD-10-CM | POA: Insufficient documentation

## 2020-09-27 DIAGNOSIS — N390 Urinary tract infection, site not specified: Secondary | ICD-10-CM | POA: Diagnosis not present

## 2020-09-27 DIAGNOSIS — F341 Dysthymic disorder: Secondary | ICD-10-CM | POA: Diagnosis not present

## 2020-09-27 DIAGNOSIS — I10 Essential (primary) hypertension: Secondary | ICD-10-CM | POA: Diagnosis not present

## 2020-09-27 DIAGNOSIS — Z8673 Personal history of transient ischemic attack (TIA), and cerebral infarction without residual deficits: Secondary | ICD-10-CM

## 2020-09-27 DIAGNOSIS — Z79899 Other long term (current) drug therapy: Secondary | ICD-10-CM | POA: Diagnosis not present

## 2020-09-27 DIAGNOSIS — I779 Disorder of arteries and arterioles, unspecified: Secondary | ICD-10-CM

## 2020-09-27 DIAGNOSIS — I5181 Takotsubo syndrome: Secondary | ICD-10-CM

## 2020-09-27 DIAGNOSIS — Z6830 Body mass index (BMI) 30.0-30.9, adult: Secondary | ICD-10-CM | POA: Diagnosis not present

## 2020-09-27 DIAGNOSIS — E039 Hypothyroidism, unspecified: Secondary | ICD-10-CM

## 2020-09-27 DIAGNOSIS — Z794 Long term (current) use of insulin: Secondary | ICD-10-CM | POA: Diagnosis not present

## 2020-09-27 DIAGNOSIS — R079 Chest pain, unspecified: Secondary | ICD-10-CM

## 2020-09-27 DIAGNOSIS — F411 Generalized anxiety disorder: Secondary | ICD-10-CM | POA: Diagnosis present

## 2020-09-27 DIAGNOSIS — Z8679 Personal history of other diseases of the circulatory system: Secondary | ICD-10-CM

## 2020-09-27 DIAGNOSIS — R52 Pain, unspecified: Secondary | ICD-10-CM | POA: Diagnosis present

## 2020-09-27 DIAGNOSIS — E669 Obesity, unspecified: Secondary | ICD-10-CM | POA: Insufficient documentation

## 2020-09-27 DIAGNOSIS — E785 Hyperlipidemia, unspecified: Secondary | ICD-10-CM | POA: Insufficient documentation

## 2020-09-27 DIAGNOSIS — R404 Transient alteration of awareness: Secondary | ICD-10-CM

## 2020-09-27 DIAGNOSIS — I951 Orthostatic hypotension: Secondary | ICD-10-CM | POA: Diagnosis not present

## 2020-09-27 DIAGNOSIS — R531 Weakness: Secondary | ICD-10-CM | POA: Diagnosis not present

## 2020-09-27 DIAGNOSIS — R0602 Shortness of breath: Secondary | ICD-10-CM

## 2020-09-27 DIAGNOSIS — G8929 Other chronic pain: Secondary | ICD-10-CM | POA: Diagnosis present

## 2020-09-27 DIAGNOSIS — I4891 Unspecified atrial fibrillation: Secondary | ICD-10-CM | POA: Diagnosis present

## 2020-09-27 DIAGNOSIS — N1831 Chronic kidney disease, stage 3a: Secondary | ICD-10-CM

## 2020-09-27 DIAGNOSIS — I129 Hypertensive chronic kidney disease with stage 1 through stage 4 chronic kidney disease, or unspecified chronic kidney disease: Secondary | ICD-10-CM | POA: Insufficient documentation

## 2020-09-27 DIAGNOSIS — R29810 Facial weakness: Secondary | ICD-10-CM

## 2020-09-27 DIAGNOSIS — I2510 Atherosclerotic heart disease of native coronary artery without angina pectoris: Secondary | ICD-10-CM | POA: Diagnosis not present

## 2020-09-27 DIAGNOSIS — I639 Cerebral infarction, unspecified: Secondary | ICD-10-CM | POA: Diagnosis not present

## 2020-09-27 DIAGNOSIS — N3000 Acute cystitis without hematuria: Secondary | ICD-10-CM

## 2020-09-27 DIAGNOSIS — N183 Chronic kidney disease, stage 3 unspecified: Secondary | ICD-10-CM | POA: Diagnosis not present

## 2020-09-27 DIAGNOSIS — Z87898 Personal history of other specified conditions: Secondary | ICD-10-CM

## 2020-09-27 DIAGNOSIS — D649 Anemia, unspecified: Secondary | ICD-10-CM

## 2020-09-27 DIAGNOSIS — U071 COVID-19: Secondary | ICD-10-CM | POA: Insufficient documentation

## 2020-09-27 DIAGNOSIS — R2681 Unsteadiness on feet: Secondary | ICD-10-CM | POA: Diagnosis not present

## 2020-09-27 DIAGNOSIS — R000 Tachycardia, unspecified: Secondary | ICD-10-CM

## 2020-09-27 LAB — RESP PANEL BY RT-PCR (FLU A&B, COVID) ARPGX2
Influenza A by PCR: NEGATIVE
Influenza B by PCR: NEGATIVE
SARS Coronavirus 2 by RT PCR: POSITIVE — AB

## 2020-09-27 LAB — COMPREHENSIVE METABOLIC PANEL
ALT: 33 U/L (ref 0–44)
AST: 27 U/L (ref 15–41)
Albumin: 3 g/dL — ABNORMAL LOW (ref 3.5–5.0)
Alkaline Phosphatase: 120 U/L (ref 38–126)
Anion gap: 10 (ref 5–15)
BUN: 13 mg/dL (ref 8–23)
CO2: 17 mmol/L — ABNORMAL LOW (ref 22–32)
Calcium: 7.5 mg/dL — ABNORMAL LOW (ref 8.9–10.3)
Chloride: 109 mmol/L (ref 98–111)
Creatinine, Ser: 0.95 mg/dL (ref 0.44–1.00)
GFR, Estimated: >60 mL/min (ref >60)
Glucose, Bld: 380 mg/dL — ABNORMAL HIGH (ref 70–99)
Potassium: 3.2 mmol/L — ABNORMAL LOW (ref 3.5–5.1)
Sodium: 136 mmol/L (ref 135–145)
Total Bilirubin: 0.8 mg/dL (ref 0.3–1.2)
Total Protein: 6.3 g/dL — ABNORMAL LOW (ref 6.5–8.1)

## 2020-09-27 LAB — TSH: TSH: 3.869 u[IU]/mL (ref 0.350–4.500)

## 2020-09-27 LAB — DIFFERENTIAL
Abs Immature Granulocytes: 0.01 10*3/uL (ref 0.00–0.07)
Basophils Absolute: 0 10*3/uL (ref 0.0–0.1)
Basophils Relative: 0 %
Eosinophils Absolute: 0.1 10*3/uL (ref 0.0–0.5)
Eosinophils Relative: 2 %
Immature Granulocytes: 0 %
Lymphocytes Relative: 13 %
Lymphs Abs: 0.7 10*3/uL (ref 0.7–4.0)
Monocytes Absolute: 0.3 10*3/uL (ref 0.1–1.0)
Monocytes Relative: 5 %
Neutro Abs: 4.4 10*3/uL (ref 1.7–7.7)
Neutrophils Relative %: 80 %

## 2020-09-27 LAB — URINALYSIS, COMPLETE (UACMP) WITH MICROSCOPIC
Bilirubin Urine: NEGATIVE
Glucose, UA: >=500 mg/dL — AB
Ketones, ur: NEGATIVE mg/dL
Nitrite: NEGATIVE
Protein, ur: 100 mg/dL — AB
Specific Gravity, Urine: 1.027 (ref 1.005–1.030)
pH: 6 (ref 5.0–8.0)

## 2020-09-27 LAB — CBC
HCT: 33.4 % — ABNORMAL LOW (ref 36.0–46.0)
Hemoglobin: 10.3 g/dL — ABNORMAL LOW (ref 12.0–15.0)
MCH: 25.8 pg — ABNORMAL LOW (ref 26.0–34.0)
MCHC: 30.8 g/dL (ref 30.0–36.0)
MCV: 83.5 fL (ref 80.0–100.0)
Platelets: 159 10*3/uL (ref 150–400)
RBC: 4 MIL/uL (ref 3.87–5.11)
RDW: 15.5 % (ref 11.5–15.5)
WBC: 5.5 10*3/uL (ref 4.0–10.5)
nRBC: 0 % (ref 0.0–0.2)

## 2020-09-27 LAB — CBG MONITORING, ED: Glucose-Capillary: 260 mg/dL — ABNORMAL HIGH (ref 70–99)

## 2020-09-27 LAB — LACTIC ACID, PLASMA: Lactic Acid, Venous: 2.2 mmol/L (ref 0.5–1.9)

## 2020-09-27 LAB — BETA-HYDROXYBUTYRIC ACID: Beta-Hydroxybutyric Acid: 0.19 mmol/L (ref 0.05–0.27)

## 2020-09-27 LAB — PROCALCITONIN: Procalcitonin: <0.1 ng/mL

## 2020-09-27 LAB — APTT: aPTT: 31 seconds (ref 24–36)

## 2020-09-27 LAB — MRSA PCR SCREENING: MRSA by PCR: NEGATIVE

## 2020-09-27 LAB — PROTIME-INR
INR: 1.1 (ref 0.8–1.2)
Prothrombin Time: 13.4 seconds (ref 11.4–15.2)

## 2020-09-27 MED ORDER — METOPROLOL TARTRATE 25 MG PO TABS
25.0000 mg | ORAL_TABLET | Freq: Two times a day (BID) | ORAL | Status: DC
  Administered 2020-09-28: 25 mg via ORAL
  Filled 2020-09-27 (×2): qty 1

## 2020-09-27 MED ORDER — INSULIN ASPART 100 UNIT/ML ~~LOC~~ SOLN
0.0000 [IU] | Freq: Every day | SUBCUTANEOUS | Status: DC
  Administered 2020-09-27 – 2020-09-28 (×2): 3 [IU] via SUBCUTANEOUS
  Filled 2020-09-27 (×2): qty 1

## 2020-09-27 MED ORDER — FENTANYL CITRATE (PF) 100 MCG/2ML IJ SOLN
12.5000 ug | INTRAMUSCULAR | Status: DC | PRN
  Administered 2020-09-27 – 2020-09-28 (×2): 12.5 ug via INTRAVENOUS
  Filled 2020-09-27 (×2): qty 2

## 2020-09-27 MED ORDER — SODIUM CHLORIDE 0.9 % IV BOLUS
1000.0000 mL | Freq: Once | INTRAVENOUS | Status: AC
  Administered 2020-09-27: 1000 mL via INTRAVENOUS

## 2020-09-27 MED ORDER — NITROGLYCERIN 0.3 MG SL SUBL
0.3000 mg | SUBLINGUAL_TABLET | SUBLINGUAL | Status: DC | PRN
  Filled 2020-09-27: qty 100

## 2020-09-27 MED ORDER — INSULIN ASPART 100 UNIT/ML ~~LOC~~ SOLN
0.0000 [IU] | Freq: Three times a day (TID) | SUBCUTANEOUS | Status: DC
  Administered 2020-09-28 (×2): 3 [IU] via SUBCUTANEOUS
  Administered 2020-09-28: 5 [IU] via SUBCUTANEOUS
  Administered 2020-09-29: 08:00:00 3 [IU] via SUBCUTANEOUS
  Administered 2020-09-29: 14:00:00 8 [IU] via SUBCUTANEOUS
  Filled 2020-09-27 (×5): qty 1

## 2020-09-27 MED ORDER — ACETAMINOPHEN 325 MG PO TABS: 650.0000 mg | ORAL_TABLET | ORAL | Status: DC | PRN

## 2020-09-27 MED ORDER — SODIUM CHLORIDE 0.9% FLUSH
3.0000 mL | Freq: Once | INTRAVENOUS | Status: AC
  Administered 2020-09-27: 3 mL via INTRAVENOUS

## 2020-09-27 MED ORDER — SODIUM CHLORIDE 0.9 % IV SOLN: 1.0000 g | INTRAVENOUS | Status: DC

## 2020-09-27 MED ORDER — ATORVASTATIN CALCIUM 20 MG PO TABS
40.0000 mg | ORAL_TABLET | Freq: Every day | ORAL | Status: DC
  Administered 2020-09-28: 40 mg via ORAL
  Filled 2020-09-27 (×2): qty 2

## 2020-09-27 MED ORDER — SODIUM CHLORIDE 0.9 % IV SOLN
1.0000 g | Freq: Once | INTRAVENOUS | Status: AC
  Administered 2020-09-27: 1 g via INTRAVENOUS
  Filled 2020-09-27: qty 10

## 2020-09-27 MED ORDER — HYDRALAZINE HCL 50 MG PO TABS
25.0000 mg | ORAL_TABLET | Freq: Four times a day (QID) | ORAL | Status: DC | PRN
Start: 2020-09-27 — End: 2020-09-27

## 2020-09-27 MED ORDER — ACETAMINOPHEN 650 MG RE SUPP: 650.0000 mg | RECTAL | Status: DC | PRN

## 2020-09-27 MED ORDER — INSULIN DETEMIR 100 UNIT/ML ~~LOC~~ SOLN
30.0000 [IU] | Freq: Every day | SUBCUTANEOUS | Status: DC
  Administered 2020-09-27 – 2020-09-28 (×2): 30 [IU] via SUBCUTANEOUS
  Filled 2020-09-27 (×5): qty 0.3

## 2020-09-27 MED ORDER — STROKE: EARLY STAGES OF RECOVERY BOOK: Freq: Once | Status: AC

## 2020-09-27 MED ORDER — CLOPIDOGREL BISULFATE 75 MG PO TABS
150.0000 mg | ORAL_TABLET | Freq: Once | ORAL | Status: DC
  Filled 2020-09-27: qty 2

## 2020-09-27 MED ORDER — ALBUTEROL SULFATE HFA 108 (90 BASE) MCG/ACT IN AERS
2.0000 | INHALATION_SPRAY | RESPIRATORY_TRACT | Status: DC | PRN
  Filled 2020-09-27: qty 6.7

## 2020-09-27 MED ORDER — ACETAMINOPHEN 160 MG/5ML PO SOLN
650.0000 mg | ORAL | Status: DC | PRN
  Filled 2020-09-27: qty 20.3

## 2020-09-27 MED ORDER — LABETALOL HCL 5 MG/ML IV SOLN
10.0000 mg | INTRAVENOUS | Status: DC | PRN
  Administered 2020-09-27: 10 mg via INTRAVENOUS
  Filled 2020-09-27: qty 4

## 2020-09-27 MED ORDER — LABETALOL HCL 5 MG/ML IV SOLN: 10.0000 mg | INTRAVENOUS | Status: DC | PRN

## 2020-09-27 NOTE — Progress Notes (Signed)
[place] As needed labetalo ladded for uncontrolled hypertension for systolic above 159 to allow for slightly high pressures for cerebral perfusion in setting of probable ischemic stroke

## 2020-09-27 NOTE — Progress Notes (Addendum)
Virtual Visit via Telephone Note   This visit type was conducted due to national recommendations for restrictions regarding the COVID-19 Pandemic (e.g. social distancing) in an effort to limit this patient's exposure and mitigate transmission in our community.  Due to her co-morbid illnesses, this patient is at least at moderate risk for complications without adequate follow up.  This format is felt to be most appropriate for this patient at this time.  The patient did not have access to video technology/had technical difficulties with video requiring transitioning to audio format only (telephone).  All issues noted in this document were discussed and addressed.  No physical exam could be performed with this format.  Please refer to the patient's chart for her  consent to telehealth for [facility].   Date:  09/27/2020   ID:  [name], DOB 1955-12-08, MRN [id number]  Patient Location: Home Provider Location: Home Office  PCP:  [name], NP  Cardiologist:  [name], MD  Electrophysiologist:  None   Evaluation Performed:  Follow-Up Visit  Chief Complaint:  65 y.o. female with history of paroxysmal atrial fibrillation not on anticoagulation, stress-induced cardiomyopathy with subsequent recovery of EF, hypertension, previous history of TIA, anemia, no obstructive CAD by 01/2016 catheterization, common carotid arterial disease, alcohol use, liver cirrhosis, pancreatitis, poorly controlled DM2, uterine cancer, gastroparesis, diverticulosis, hypothyroidism, episodes of syncope, orthostatic hypotension improved on midodrine, and seen today for [facility] follow-up with report of left-sided weakness, racing heart rate, chest pain, and recent fall 3 to 4 days ago during which time she hit her head.  Chief Complaint  Patient presents with  . Hospitalization Follow-up    Hospital follow up, c/o heart racing when she lie down and it causes her some discomfort. Medications verbally  reviewed with patient.      History of Present Illness:    [name] is a 65 y.o. female with PMH as above.  She has complex past medical history as outlined above, including a history of alcohol use and liver cirrhosis/pancreatitis.  She also has known poorly controlled DM2, as well as uterine cancer.  She was last seen in office by her primary cardiologist, Dr. Rockey Situ, 07/29/2019.  She has known history of carotid disease.  12/2017 carotid study showed mild heterogeneous atherosclerotic plaque noted of distal bilateral common carotid arteries.  She has known history of atrial fibrillation with RVR.  She was previously on anticoagulation; however, this appears to have been discontinued in the past due to risk of bleeding.  She also has history of chronic anemia on review of labs.  She has history of stress-induced cardiomyopathy.  No known history of obstructive cardiac disease.  She underwent 2017 LHC that showed minimal luminal irregularities without evidence of obstructive CAD.  She had mild to moderately reduced LV SF at 40% with wall motion abnormalities, suggestive of stress-induced cardiomyopathy.  In addition, mildly elevated LVEDP was noted.  Recommendations at that time were to continue metoprolol and Eliquis.  Since that time, she has demonstrated recovery of EF.  Most recent 07/18/2020 echo showed EF 60 to 65%, NRWMA, moderate LVH, LV diastolic function unable to be evaluated.  She has history of orthostatic hypotension / syncope.  Her last admission was 08/31/2020 for syncope due to orthostatic hypotension with hypovolemia as likely etiology.  Cardiology was not consulted at this time.  On review of EMR, she had positive orthostatic vitals.  Hypotension improved with IV fluid.  She was started on low-dose midodrine.  During admission, she was documented as having A. fib with RVR with subsequent IV diltiazem.  She was rate controlled with metoprolol.  No anticoagulation was started.   During admission, memory impairment was noted.  Also noted was mildly elevated TSH with normal free T4.  PT evaluated with recommendation for SNF placement.  Ultimately, patient's family decided to take her back with home health.    On the day of discharge, she was noted to be doing well without dizziness while working with PT.  It was noted there was some concern of vertigo, and she was given meclizine as needed.  Since that time, she reports that she has been feeling poorly with ongoing dizziness, chest pain, racing heart rate, presyncope, syncope, loss of consciousness, left-sided/asymmetric weakness, and recent fall with injury to her head.  Approximately 3 to 4 days ago, she reports a fall while walking in her hallway.  She felt racing heart rate, chest pain, dizziness, and subsequently lost consciousness.  She hit her head.  She is uncertain the amount of time during which she was unconscious. Since that time, she has noted blurry vision and confusion.  In addition, she has noted left-sided weakness.    She reports this is not her 1st time falling or hitting her head, as well as losing consciousness.  She states that this is happened before in the past with 3-4 falls since her most recent 08/2020 admission.     Today, 09/27/2020, she is seen via telemedicine (telephone only) visit for hospital follow-up.  She reports that she was not discharged with [facility] or ambulatory cardiac monitoring after her atrial fibrillation with RVR at her previous 08/31/2020 admission.  Since that time, she has had intermittent racing heart rate and chest pain.  Her most recent episode of chest pain and racing heart rate was yesterday in the early afternoon and reportedly triggered by lying down in bed.  Chest pain and heart rate improved with sitting up.  She reports she has never had chest pain like this before in the past, though she has noted racing heart rate in the past.    She did not have a BP cuff and was therefore  unable to provide heart rate or BP readings. She was uncertain of any heart rate or BP readings in the past.  Given her racing heart rate, recent fall, recent reported head injury during her fall, and left-sided weakness, she was asked to go to the mirror during our telephone visit and perform a quick neurologic test with patient reported facial droop during smiling and raising eyebrows, as well as frowning. She was asked if she noted anything unusual to prevent leading questions.  Of note, our visit was telephone only without video, so this was facial droop and weakness that was patient reported over the telephone only.    Telephone visit was stopped at this time, given concern for stroke or head bleed, and with rcommendation to call 911 and present to the emergency department for further work-up of both her head injury and her left-sided weakness.    The emergency department was then called by myself and notified of the patient.  Several calls were then made to the patient to ensure that she was able to reach her friends to notify them that she needed to go to the ED, as well as call 911 to ensure she made it to the emergency department in a timely fashion.  She did deny any chest pain at the time of  her telephone visit.  She also denied any racing heart rate, though she was fearful that it she were to lay flat again, her racing heart rate and chest pain would return.  The patient does not have symptoms concerning for COVID-19 infection (fever, chills, cough, or new shortness of breath).   Past Medical History:  Diagnosis Date  . Acute encephalopathy 05/22/2018  . Allergy   . Anginal pain ([place])   . Anxiety   . Ascites   . C. difficile colitis 07/10/2015  . Cancer (HCC)    HX OF CANCER OF UTERUS   . Cirrhosis of liver not due to alcohol ([place]) 2016  . Degenerative disk disease   . Diverticulitis   . Gastroparesis   . GERD (gastroesophageal reflux disease)   . History of hiatal hernia   .  Hypertension   . Hypothyroid   . Hypothyroidism due to amiodarone   . Ileus ([place]) 08/01/2015  . Intussusception intestine ([place]) 05/2015  . Orthostatic hypotension   . PAF (paroxysmal atrial fibrillation) ([place]) 03/2015   a. new onset 03/2015 in setting of intractable N/V; b. on Eliquis 5 mg bid; c. CHADSVASc 4 (DM, TIA x 2, female)  . Pancreatitis   . Pneumonia 11/14/2015  . Pneumonia 07/2020  . Right ureteral stone 07/14/2016  . Sepsis ([place]) 12/15/2017  . Sick sinus syndrome ([place])   . Stomach ulcer   . Stroke [facility])    with minimal left sided weakness  . Syncope 01/2015  . Syncope due to orthostatic hypotension 05/18/2015  . Tachyarrhythmia 01/10/2016  . TIA (transient ischemic attack) 02/2015  . Type 1 diabetes ([place])    on levemir  . UTERINE CANCER, HX OF 03/27/2007   Qualifier: Diagnosis of  By: Maxie Better FNP, Rosalita Levan   . UTI (urinary tract infection) 05/22/2018   Past Surgical History:  Procedure Laterality Date  . ABDOMINAL HYSTERECTOMY    . CARDIAC CATHETERIZATION N/A 01/12/2016   Procedure: Left Heart Cath and Coronary Angiography;  Surgeon: Wellington Hampshire, MD;  Location: [facility];  Service: Cardiovascular;  Laterality: N/A;  . CHOLECYSTECTOMY    . CYSTOSCOPY W/ RETROGRADES Bilateral 12/11/2019   Procedure: CYSTOSCOPY WITH RETROGRADE PYELOGRAM;  Surgeon: Billey Co, MD;  Location: [facility] ORS;  Service: Urology;  Laterality: Bilateral;  . CYSTOSCOPY WITH BIOPSY N/A 12/11/2019   Procedure: CYSTOSCOPY WITH BLADDER BIOPSY;  Surgeon: Billey Co, MD;  Location: [facility];  Service: Urology;  Laterality: N/A;  . CYSTOSCOPY WITH FULGERATION N/A 12/11/2019   Procedure: CYSTOSCOPY WITH FULGERATION;  Surgeon: Billey Co, MD;  Location: [facility];  Service: Urology;  Laterality: N/A;  . CYSTOSCOPY WITH STENT PLACEMENT Right 12/11/2019   Procedure: CYSTOSCOPY WITH STENT PLACEMENT;  Surgeon: Billey Co, MD;  Location: [facility];  Service: Urology;  Laterality: Right;   . CYSTOSCOPY WITH URETHRAL DILATATION Right 12/11/2019   Procedure: CYSTOSCOPY WITH URETERAL DILATATION;  Surgeon: Billey Co, MD;  Location: [facility];  Service: Urology;  Laterality: Right;  . CYSTOSCOPY/URETEROSCOPY/HOLMIUM LASER Right 07/14/2016   Procedure: CYSTOSCOPY/URETEROSCOPY/HOLMIUM LASER;  Surgeon: Alexis Frock, MD;  Location: [facility];  Service: Urology;  Laterality: Right;  . ESOPHAGOGASTRODUODENOSCOPY N/A 04/04/2015   Procedure: ESOPHAGOGASTRODUODENOSCOPY (EGD);  Surgeon: Hulen Luster, MD;  Location: [facility] ENDOSCOPY;  Service: Endoscopy;  Laterality: N/A;  . ESOPHAGOGASTRODUODENOSCOPY N/A 12/28/2017   Procedure: ESOPHAGOGASTRODUODENOSCOPY (EGD);  Surgeon: Lin Landsman, MD;  Location: [facility] ENDOSCOPY;  Service: Gastroenterology;  Laterality: N/A;  . ESOPHAGOGASTRODUODENOSCOPY (EGD) WITH  PROPOFOL N/A 01/18/2016   Procedure: ESOPHAGOGASTRODUODENOSCOPY (EGD) WITH PROPOFOL;  Surgeon: Lucilla Lame, MD;  Location: [facility] ENDOSCOPY;  Service: Endoscopy;  Laterality: N/A;  . FLEXIBLE SIGMOIDOSCOPY N/A 01/18/2016   Procedure: FLEXIBLE SIGMOIDOSCOPY;  Surgeon: Lucilla Lame, MD;  Location: [facility] ENDOSCOPY;  Service: Endoscopy;  Laterality: N/A;  . HERNIA REPAIR    . URETEROSCOPY Right 12/11/2019   Procedure: Christen Butter;  Surgeon: Billey Co, MD;  Location: [facility];  Service: Urology;  Laterality: Right;     Current Meds  Medication Sig  . albuterol (VENTOLIN HFA) 108 (90 Base) MCG/ACT inhaler Inhale 2 puffs into the lungs every 4 (four) hours as needed for wheezing or shortness of breath.  Marland Kitchen atorvastatin (LIPITOR) 40 MG tablet Take 40 mg by mouth at bedtime.  . cyclobenzaprine (FLEXERIL) 10 MG tablet Take 1 tablet (10 mg total) by mouth 3 (three) times daily as needed for muscle spasms. Must last 30 days. (Patient taking differently: Take 10 mg by mouth 3 (three) times daily. Must last 30 days.)  . glucose blood (ACCU-CHEK AVIVA PLUS) test strip Use as instructed to test  blood sugar up to 4 times daily  . insulin detemir (LEVEMIR FLEXTOUCH) 100 UNIT/ML FlexPen INJECT 30 UNITS SUBCUTANEOUSLY EVERY MORNING AND AT BEDTIME  . Melatonin 10 MG CAPS Take 1 tablet by mouth at bedtime.  . metoprolol tartrate (LOPRESSOR) 25 MG tablet Take 25 mg by mouth 2 (two) times daily.  . pantoprazole (PROTONIX) 40 MG tablet Take 1 tablet (40 mg total) by mouth daily.  . sertraline (ZOLOFT) 50 MG tablet TAKE 1 TABLET BY MOUTH DAILY  . Spacer/Aero-Hold Chamber Bags MISC Use spacer with inhaler when you have shortness of breath  . TECHLITE PEN NEEDLES 32G X 6 MM MISC USE TWICE DAILY AS DIRECTED     Allergies:   Aspirin, Codeine sulfate, Erythromycin, Prednisone, Rosiglitazone maleate, and Tetanus-diphtheria toxoids td   Social History   Tobacco Use  . Smoking status: Former Smoker    Types: Cigarettes  . Smokeless tobacco: Never Used  . Tobacco comment: 25 years ago and only smoked occasionally  Vaping Use  . Vaping Use: Never used  Substance Use Topics  . Alcohol use: No  . Drug use: No     Family Hx: The patient's family history includes CAD in her brother and sister; Heart attack in her sister; Hypertension in her mother.  ROS:   Please see the history of present illness.    Review of Systems  Constitutional: Positive for malaise/fatigue.  Cardiovascular: Positive for chest pain and palpitations.  Musculoskeletal: Positive for falls.  Neurological: Positive for dizziness, focal weakness, loss of consciousness and weakness.  All other systems reviewed and are negative.   All other systems reviewed and are negative.  Objective:    Vital Signs:  Ht 5' 3"  (1.6 m)   Wt 171 lb (77.6 kg)   BMI 30.29 kg/m    VITAL SIGNS:  reviewed  Accessory clinical findings reviewed:    EKG:  No ECG reviewed.  Previous vitals reviewed today:    Temp Readings from Last 3 Encounters:  08/31/20 98 F (36.7 C) (Oral)  07/18/20 99.3 F (37.4 C) (Oral)  07/01/20 98.6 F  (37 C) (Oral)   BP Readings from Last 3 Encounters:  08/31/20 138/63  07/18/20 139/67  07/02/20 (!) 165/80   Pulse Readings from Last 3 Encounters:  08/31/20 79  07/18/20 98  07/02/20 (!) 107    Wt Readings from Last 3 Encounters:  09/27/20 171 lb (77.6 kg)  08/29/20 171 lb 15.3 oz (78 kg)  07/16/20 135 lb (61.2 kg)     Labs reviewed today:      Lab Results  Component Value Date   WBC 3.7 (L) 08/31/2020   HGB 8.7 (L) 08/31/2020   HCT 28.8 (L) 08/31/2020   MCV 86.2 08/31/2020   PLT 150 08/31/2020   Lab Results  Component Value Date   CREATININE 1.23 (H) 08/31/2020   BUN 20 08/31/2020   NA 134 (L) 08/31/2020   K 4.2 08/31/2020   CL 104 08/31/2020   CO2 21 (L) 08/31/2020   Lab Results  Component Value Date   ALT 27 08/28/2020   AST 38 08/28/2020   ALKPHOS 129 (H) 08/28/2020   BILITOT 0.9 08/28/2020   Lab Results  Component Value Date   CHOL 123 08/18/2019   HDL 35.40 (L) 08/18/2019   LDLCALC 58 08/18/2019   TRIG 145.0 08/18/2019   CHOLHDL 3 08/18/2019    Lab Results  Component Value Date   HGBA1C 10.0 (H) 08/28/2020   Lab Results  Component Value Date   TSH 7.895 (H) 08/29/2020     Prior CV Studies reviewed today:    The following studies were reviewed today:  07/2020 Echocardiogram 1. Left ventricular ejection fraction, by estimation, is 60 to 65%. The  left ventricle has normal function. The left ventricle has no regional  wall motion abnormalities. There is moderate left ventricular hypertrophy.  Left ventricular diastolic function  could not be evaluated.  2. Right ventricular systolic function is normal. The right ventricular  size is normal. Tricuspid regurgitation signal is inadequate for assessing  PA pressure.  3. The mitral valve is normal in structure. No evidence of mitral valve  regurgitation. No evidence of mitral stenosis.  4. The aortic valve is normal in structure. Aortic valve regurgitation is  not visualized. Mild  aortic valve sclerosis is present, with no evidence  of aortic valve stenosis.  5. No apical window and no subcostal window. Limited images.   12/29/2017 Ultrasound carotid bilateral 1. No evidence of significant atherosclerotic plaque or stenosis in either internal carotid artery. 2. Mild heterogeneous atherosclerotic plaque in the distal bilateral common carotid arteries. 3. Vertebral arteries are patent with normal antegrade flow.  01/12/2016 Left heart catheterization and coronary angiography  There is mild to moderate left ventricular systolic dysfunction. 1. Minimal luminal irregularities with no evidence of obstructive coronary artery disease. 2. Mildly to Moderately reduced LV systolic function with an ejection fraction of 40% with wall motion abnormality suggestive of stress-induced cardiomyopathy. 3. Mildly elevated left ventricular end-diastolic pressure. Recommendations: The patient has no evidence of obstructive coronary artery disease. Her presentation and cardiac catheterization is consistent with stress-induced cardiomyopathy. Continue treatment with metoprolol. Resume anticoagulation with Eliquis tomorrow if no bleeding complications. Treat underlying medical conditions.  ASSESSMENT & PLAN:    Left sided weakness, reported L facial droop LOC, fall 3-4 days ago with injury to head History of previous TIA by EMR --H/o paroxysmal atrial fibrillation and no longer on anticoagulation, likely due to anemia and risk of bleeding.  A. fib noted during most recent admission for dizziness and orthostatic hypotension, suspected due to dehydration/hypovolemia.  Since discharge, she notes that she has been feeling poorly with chest pain, racing heart rate, and ongoing falls/loss of consciousness.  She reports her last fall as approximately 3 to 4 days ago.  Since that time, she has noticed blurry vision and is unable to  read.  She also has noted left-sided weakness.  During her phone  call, it was requested that she go to the mirror and smile, frown, and raise her eyebrows.  She noted left-sided droop with all of these activities with instruction to immediately call 911 and contact a friend to stay with her until EMS arrival. She was instructed to call this friend and not walk or drive herself. I subsequently called the emergency department myself and spoke with Vaughan Basta in the ED to provide information regarding the patient's symptoms and estimated onset of each symptom, as well as prep the emergency department that Ms. Warrior is on her way to them.  Ms. Felan was then called back several minutes later to ensure she was on her way to the emergency department. Based on our telephone visit, presentation suspicious for CVA with risk factors to include Afib not on [place]. Also considered was possible subdural hematoma. ED contact was notified of these concerns. --In the ED, recommend  Stat head imaging to rule out CVA/subdural hematoma.    EKG at presentation.    Telemetry to assess if arrhythmia and quantify the burden of any arrhythmia or ectopy.  Labs, including BMET, Mg, CBC, TSH, glucose / HgbA1c, and high-sensitivity troponin. .  Consider limited echo, carotids as outlined below.    Chest Pain, atypical Previous nonobstructive CAD by 2017 LHC --Reports new chest pain with onset yesterday in the setting of racing heart rate.  During her call today, she reports that the chest pain is new and follows her racing heart rate. Both racing heart rate and chest pain seem aggravated by lying down and alleviated by sitting up.  Previous 2017 LHC with nonobstructive CAD.  Most recent echo with normal EF, improved from reduced EF by 2017 thought 2/2 stress-induced cardiomyopathy.  Risk factors for cardiac ischemia include former smoker, hypertension, age, poorly controlled diabetes.  As above, recommend EKG, telemetry monitoring.  Obtain high-sensitivity troponin.  Consider repeat limited echo to  reevaluate LVEF and rule out any acute structural changes, right heart strain, or pericardial effusion.  Pulmonary embolism workup should be considered if indicated based on presentation and findings of echo.   Racing heart rate H/o paroxysmal atrial fibrillation not on anticoagulation -- History of paroxysmal atrial fibrillation.  Previously on anticoagulation; however, this has since been discontinued, likely due to risk of bleeding. Concern today as above for CVA, given focal weakness, and especially given known Afib not on [place].  Recommend stroke workup and that reassess risks and benefits of anticoagulation in the ED. CHA2DS2VASc score of at least 8 (HFrEF with recovered EF, history of TIA, hypertension, age, DM2, vascular, female).  Monitor on telemetry.  Check electrolytes, TSH.  Consider live ambulatory cardiac monitoring at discharge with [facility] AT x2 weeks.  History of stress-induced cardiomyopathy Subsequent recovery of LVEF by 07/2020 echo --Denies any signs or symptoms of volume overload.  Previous 2017 LHC with reduced EF and wall motion abnormality, attributed to stress-induced cardiomyopathy, given no obstructive CAD seen on catheterization.  Consider repeat limited echo as above.  Monitor I's/O's, daily weights.  History of orthostatic hypotension History of falls -- Recent admission for orthostatic hypotension with fall attributed to hypovolemia.  She does have a known history of orthostatic hypotension and falls.  She reports staying hydrated.  History of carotid disease -- Most recent carotid study in 2019 with common carotid disease noted as above.  No updated studies since that time. Consider updating.  No reported amaurosis fugax  today.  History of anemia -- Known history of anemia.  Recommend CBC.  Denies recent/current signs or symptoms of bleeding.  Hypothyroidism -- Check TSH, as could contribute to above symptoms.  Poorly controlled DM2 -- SSI, check hemoglobin/A1c, as  poorly controlled Leukos levels could contribute to above symptoms.  COVID-19 Education: The signs and symptoms of COVID-19 were discussed with the patient and how to seek care for testing (follow up with PCP or arrange E-visit).  The importance of social distancing was discussed today.   Medication Adjustments/Labs and Tests Ordered: Current medicines are reviewed at length with the patient today.  Concerns regarding medicines are outlined above.    Signed, Arvil Chaco, PA-C  09/27/2020  [facility]

## 2020-09-27 NOTE — ED Triage Notes (Signed)
Arrives with left sided weakness, slurred speech x 3-4 days.  STates fell today at home and hit head. No LOC.    Patient is AAOx3.  Speech is with a stutter, but clear.  MAE.  Left sided weakness noted.  Left arm drifts.  States symptoms started 09/22/2020

## 2020-09-27 NOTE — ED Notes (Signed)
Pt failed swallow screen Rita Code, MD notified

## 2020-09-27 NOTE — ED Provider Notes (Signed)
[facility] Emergency Department Provider Note   ____________________________________________   Event Date/Time   First MD Initiated Contact with Patient 09/27/20 1618     (approximate)  I have reviewed the triage vital signs and the nursing notes.   HISTORY  Chief Complaint Hyperglycemia    HPI DARCEE DEKKER is a 65 y.o. female history of diverticulitis hypothyroidism paroxysmal A. fib pancreatitis multiple other medical etiologies diabetes   Patient reports that she fell out of bed today.  She did hit her head.  Denies loss consciousness.  Called and talked with her cardiologist recommended she come to the ER  She has been having trouble with her speech stuttering for about 3 or 4 days now, also reports she has had weakness in her left arm and left leg for 3 to 4 days.  Her blood sugars also been running high.  She has been experiencing some discomfort and abnormal odor with urination as well.  Denies being in pain  Past Medical History:  Diagnosis Date  . Acute encephalopathy 05/22/2018  . Allergy   . Anginal pain ([place])   . Anxiety   . Ascites   . C. difficile colitis 07/10/2015  . Cancer (HCC)    HX OF CANCER OF UTERUS   . Cirrhosis of liver not due to alcohol ([place]) 2016  . Degenerative disk disease   . Diverticulitis   . Gastroparesis   . GERD (gastroesophageal reflux disease)   . History of hiatal hernia   . Hypertension   . Hypothyroid   . Hypothyroidism due to amiodarone   . Ileus ([place]) 08/01/2015  . Intussusception intestine ([place]) 05/2015  . Orthostatic hypotension   . PAF (paroxysmal atrial fibrillation) ([place]) 03/2015   a. new onset 03/2015 in setting of intractable N/V; b. on Eliquis 5 mg bid; c. CHADSVASc 4 (DM, TIA x 2, female)  . Pancreatitis   . Pneumonia 11/14/2015  . Pneumonia 07/2020  . Right ureteral stone 07/14/2016  . Sepsis ([place]) 12/15/2017  . Sick sinus syndrome ([place])   . Stomach ulcer   . Stroke [facility])    with  minimal left sided weakness  . Syncope 01/2015  . Syncope due to orthostatic hypotension 05/18/2015  . Tachyarrhythmia 01/10/2016  . TIA (transient ischemic attack) 02/2015  . Type 1 diabetes ([place])    on levemir  . UTERINE CANCER, HX OF 03/27/2007   Qualifier: Diagnosis of  By: Maxie Better FNP, Rosalita Levan   . UTI (urinary tract infection) 05/22/2018    Patient Active Problem List   Diagnosis Date Noted  . Stroke ([place]) 09/27/2020  . Atrial fibrillation with RVR ([place]) 08/27/2020  . Hypoglycemia 08/27/2020  . CKD (chronic kidney disease), stage III ([place]) 08/27/2020  . Shortness of breath 07/16/2020  . [place] intertrigo 07/16/2020  . Sepsis ([place]) 11/23/2019  . Acute pyelonephritis 09/30/2019  . Acute pain of left foot 08/18/2019  . Type II diabetes mellitus with renal manifestations ([place]) 08/13/2019  . Weakness of both lower extremities 07/31/2019  . Altered mental status 07/14/2019  . GAD (generalized anxiety disorder) 06/11/2019  . Poor memory 06/11/2019  . Constipation 05/25/2019  . Acute medial meniscus tear of left knee 05/25/2019  . AKI (acute kidney injury) ([place]) 05/16/2019  . Closed fracture of left distal fibula 05/16/2019  . Sinus tachycardia 05/05/2019  . Congestive dilated cardiomyopathy ([place]) 04/14/2019  . CAD (coronary artery disease) 04/10/2019  . Closed fracture of lateral malleolus 04/10/2019  . Lumbar facet syndrome (Bilateral) (  R>L) 03/09/2019  . Long term (current) use of anticoagulants 03/09/2019  . Unspecified cirrhosis of liver ([place]) 02/11/2019  . Hypoalbuminemia 02/11/2019  . Edema due to hypoalbuminemia 02/11/2019  . Lower extremity edema 01/26/2019  . Prolonged QT interval 12/17/2018  . Hypomagnesemia 12/17/2018  . Uncontrolled type 2 diabetes mellitus ([place]) 12/17/2018  . H/O TIA (transient ischemic attack) and stroke 12/17/2018  . Personal history of surgery to heart and great vessels, presenting hazards to health 12/17/2018  . DDD (degenerative disc  disease), lumbar 12/17/2018  . Lumbar intervertebral disc displacement 12/17/2018  . Chronic musculoskeletal pain 12/17/2018  . Chronic generalized pain 12/17/2018  . Hyponatremia 12/17/2018  . Fibromyalgia syndrome 12/17/2018  . Other intervertebral disc displacement, lumbar region 12/17/2018  . Vitamin D deficiency 11/26/2018  . Elevated C-reactive protein (CRP) 11/26/2018  . Elevated sed rate 11/26/2018  . Chronic low back pain (Primary area of Pain) (Bilateral) (L>R) w/ sciatica (Bilateral) 11/25/2018  . Chronic lower extremity pain (Secondary Area of Pain) (Bilateral) (L>R) 11/25/2018  . Chronic neck pain 11/25/2018  . Pharmacologic therapy 11/25/2018  . Disorder of skeletal system 11/25/2018  . Problems influencing health status 11/25/2018  . Renal mass 10/06/2018  . Chronic intermittent abdominal pain 09/25/2018  . Unspecified abdominal pain 09/25/2018  . Frequent falls 09/12/2018  . Orthostatic hypotension 08/18/2018  . Normochromic normocytic anemia 08/18/2018  . Acute renal failure superimposed on stage 3a chronic kidney disease ([place]) 08/18/2018  . History of hypothyroidism 08/14/2018  . Medical non-compliance 08/14/2018  . Diabetic ketoacidosis ([place]) 05/22/2018  . UTI (urinary tract infection) 05/22/2018  . NSTEMI (non-ST elevated myocardial infarction) ([place]) 01/11/2016  . Tachy-brady syndrome ([place]) 12/28/2015  . PAF (paroxysmal atrial fibrillation) ([place]) 11/24/2015  . Type 2 diabetes mellitus with neurologic complication ([place]) 79/89/2119  . S/P cholecystectomy 11/23/2015  . Narcotic abuse ([place]) 11/22/2015  . Chronic superficial gastritis without bleeding 11/22/2015  . History of Clostridium difficile 11/22/2015  . History of TIA (transient ischemic attack) 11/22/2015  . Mixed hyperlipidemia 11/22/2015  . Diverticulosis 11/22/2015  . History of uterine cancer 11/22/2015  . Hypothyroidism, unspecified 11/22/2015  . Intractable cyclical vomiting with nausea 11/22/2015   . Community acquired pneumonia of left lower lobe of lung 11/14/2015  . Narcotic withdrawal ([place]) 11/11/2015  . Hyperlipidemia 08/31/2015  . Hypotension 08/06/2015  . Cryptogenic cirrhosis ([place]) 07/10/2015  . Atrial fibrillation ([place]) 07/10/2015  . Malnutrition of moderate degree 07/04/2015  . Symptomatic bradycardia 05/27/2015  . Chronic anemia 05/19/2015  . Thrombocytopenia ([place]) 05/19/2015  . Hypokalemia 04/06/2015  . Hyperlipidemia with target LDL less than 100 04/06/2015  . Syncope 03/11/2015  . CVA (cerebral vascular accident) ([place]) 02/15/2015  . Essential hypertension 01/12/2015  . Chronically on opiate therapy 01/12/2015  . Cirrhosis of liver not due to alcohol ([place]) 2016  . S/P Nissen fundoplication (without gastrostomy tube) procedure 05/11/2014  . Dysphagia 04/19/2014  . DEPRESSION/ANXIETY 06/27/2007  . Myofascial pain syndrome 06/27/2007  . Chronic pain syndrome 03/28/2007  . GERD 03/27/2007  . Diverticulosis of large intestine without perforation or abscess without bleeding 03/27/2007  . Proteinuria 03/27/2007    Past Surgical History:  Procedure Laterality Date  . ABDOMINAL HYSTERECTOMY    . CARDIAC CATHETERIZATION N/A 01/12/2016   Procedure: Left Heart Cath and Coronary Angiography;  Surgeon: Wellington Hampshire, MD;  Location: [facility];  Service: Cardiovascular;  Laterality: N/A;  . CHOLECYSTECTOMY    . CYSTOSCOPY W/ RETROGRADES Bilateral 12/11/2019   Procedure: CYSTOSCOPY WITH RETROGRADE PYELOGRAM;  Surgeon: Billey Co, MD;  Location: [facility] OR[facility];  Service: Urology;  Laterality: Bilateral;  . CYSTOSCOPY WITH BIOPSY N/A 12/11/2019   Procedure: CYSTOSCOPY WITH BLADDER BIOPSY;  Surgeon: Billey Co, MD;  Location: [facility];  Service: Urology;  Laterality: N/A;  . CYSTOSCOPY WITH FULGERATION N/A 12/11/2019   Procedure: CYSTOSCOPY WITH FULGERATION;  Surgeon: Billey Co, MD;  Location: [facility];  Service: Urology;  Laterality: N/A;  . CYSTOSCOPY WITH  STENT PLACEMENT Right 12/11/2019   Procedure: CYSTOSCOPY WITH STENT PLACEMENT;  Surgeon: Billey Co, MD;  Location: [facility];  Service: Urology;  Laterality: Right;  . CYSTOSCOPY WITH URETHRAL DILATATION Right 12/11/2019   Procedure: CYSTOSCOPY WITH URETERAL DILATATION;  Surgeon: Billey Co, MD;  Location: [facility];  Service: Urology;  Laterality: Right;  . CYSTOSCOPY/URETEROSCOPY/HOLMIUM LASER Right 07/14/2016   Procedure: CYSTOSCOPY/URETEROSCOPY/HOLMIUM LASER;  Surgeon: Alexis Frock, MD;  Location: [facility];  Service: Urology;  Laterality: Right;  . ESOPHAGOGASTRODUODENOSCOPY N/A 04/04/2015   Procedure: ESOPHAGOGASTRODUODENOSCOPY (EGD);  Surgeon: Hulen Luster, MD;  Location: [facility] ENDOSCOPY;  Service: Endoscopy;  Laterality: N/A;  . ESOPHAGOGASTRODUODENOSCOPY N/A 12/28/2017   Procedure: ESOPHAGOGASTRODUODENOSCOPY (EGD);  Surgeon: Lin Landsman, MD;  Location: [facility];  Service: Gastroenterology;  Laterality: N/A;  . ESOPHAGOGASTRODUODENOSCOPY (EGD) WITH PROPOFOL N/A 01/18/2016   Procedure: ESOPHAGOGASTRODUODENOSCOPY (EGD) WITH PROPOFOL;  Surgeon: Lucilla Lame, MD;  Location: [facility] ENDOSCOPY;  Service: Endoscopy;  Laterality: N/A;  . FLEXIBLE SIGMOIDOSCOPY N/A 01/18/2016   Procedure: FLEXIBLE SIGMOIDOSCOPY;  Surgeon: Lucilla Lame, MD;  Location: [facility] ENDOSCOPY;  Service: Endoscopy;  Laterality: N/A;  . HERNIA REPAIR    . URETEROSCOPY Right 12/11/2019   Procedure: Christen Butter;  Surgeon: Billey Co, MD;  Location: [facility] ORS;  Service: Urology;  Laterality: Right;    Prior to Admission medications   Medication Sig Start Date End Date Taking? Authorizing Provider  albuterol (VENTOLIN HFA) 108 (90 Base) MCG/ACT inhaler Inhale 2 puffs into the lungs every 4 (four) hours as needed for wheezing or shortness of breath. 07/18/20   Elodia Florence., MD  atorvastatin (LIPITOR) 40 MG tablet Take 40 mg by mouth at bedtime.    [provider]  cyclobenzaprine  (FLEXERIL) 10 MG tablet Take 1 tablet (10 mg total) by mouth 3 (three) times daily as needed for muscle spasms. Must last 30 days. Patient taking differently: Take 10 mg by mouth 3 (three) times daily. Must last 30 days. 07/07/20 10/05/20  Milinda Pointer, MD  glucose blood (ACCU-CHEK AVIVA PLUS) test strip Use as instructed to test blood sugar up to 4 times daily 10/09/19   [name], NP  insulin detemir (LEVEMIR FLEXTOUCH) 100 UNIT/ML FlexPen INJECT 30 UNITS SUBCUTANEOUSLY EVERY MORNING AND AT BEDTIME 08/19/20   [name], NP  Melatonin 10 MG CAPS Take 1 tablet by mouth at bedtime.    [provider]  metoprolol tartrate (LOPRESSOR) 25 MG tablet Take 25 mg by mouth 2 (two) times daily. 04/08/20   [provider]  nitroGLYCERIN (NITROSTAT) 0.3 MG SL tablet Place 1 tablet (0.3 mg total) under the tongue every 5 (five) minutes as needed for chest pain. 07/24/19 07/23/20  Nena Polio, MD  oxyCODONE (OXY IR/ROXICODONE) 5 MG immediate release tablet Take 1 tablet (5 mg total) by mouth 4 (four) times daily for 7 days. Max: 4/day. Must last 7 days. 08/24/20 08/31/20  Milinda Pointer, MD  oxyCODONE (OXY IR/ROXICODONE) 5 MG immediate release tablet Take 1 tablet (5 mg total) by mouth  3 (three) times daily for 7 days. Max: 3/day. Must last 7 days. 08/31/20 09/07/20  Milinda Pointer, MD  oxyCODONE (OXY IR/ROXICODONE) 5 MG immediate release tablet Take 1 tablet (5 mg total) by mouth 2 (two) times daily for 7 days. Max: 2/day. Must last 7 days. 09/07/20 09/14/20  Milinda Pointer, MD  oxyCODONE (OXY IR/ROXICODONE) 5 MG immediate release tablet Take 1 tablet (5 mg total) by mouth daily for 7 days. Max: 1/day. Must last 7 days. 09/14/20 09/21/20  Milinda Pointer, MD  pantoprazole (PROTONIX) 40 MG tablet Take 1 tablet (40 mg total) by mouth daily. 07/18/20 08/17/20  Elodia Florence., MD  sertraline (ZOLOFT) 50 MG tablet TAKE 1 TABLET BY MOUTH DAILY Patient taking  differently: Take 50 mg by mouth daily. 09/12/20   [name], NP  Spacer/Aero-Hold Chamber Bags MISC Use spacer with inhaler when you have shortness of breath 07/18/20   Elodia Florence., MD  TECHLITE PEN NEEDLES 32G X 6 MM MISC USE TWICE DAILY AS DIRECTED 09/12/20   [name], NP    Allergies Aspirin, Codeine sulfate, Erythromycin, Prednisone, Rosiglitazone maleate, and Tetanus-diphtheria toxoids td  Family History  Problem Relation Age of Onset  . Hypertension Mother   . CAD Sister   . Heart attack Sister        Deceased Nov 12, 2014  . CAD Brother     Social History Social History   Tobacco Use  . Smoking status: Former Smoker    Types: Cigarettes  . Smokeless tobacco: Never Used  . Tobacco comment: 25 years ago and only smoked occasionally  Vaping Use  . Vaping Use: Never used  Substance Use Topics  . Alcohol use: No  . Drug use: No    Review of Systems Constitutional: No fever/chills feeling fatigued for several days Eyes: No visual changes. ENT: No sore throat. Cardiovascular: Denies chest pain. Respiratory: Denies shortness of breath. Gastrointestinal: No abdominal pain.   Genitourinary: Negative for dysuria. Musculoskeletal: Negative for back pain. Skin: Negative for rash. Neurological: Negative for headaches with decreased sensation and weakness in her left arm and left leg for about 3 to 4 days    ____________________________________________   PHYSICAL EXAM:  VITAL SIGNS: ED Triage Vitals  Enc Vitals Group     BP 09/27/20 1315 (!) 157/85     Pulse Rate 09/27/20 1315 (!) 113     Resp 09/27/20 1315 16     Temp 09/27/20 1315 98.1 F (36.7 C)     Temp Source 09/27/20 1315 Oral     SpO2 09/27/20 1315 100 %     Weight 09/27/20 1314 171 lb (77.6 kg)     Height 09/27/20 1314 5' 3"  (1.6 m)     Head Circumference --      Peak Flow --      Pain Score 09/27/20 1315 0     Pain Loc --      Pain Edu? --      Excl. in [place]? --      Constitutional: Alert and oriented. Well appearing and in no acute distress.  Slight left facial droop Eyes: Conjunctivae are normal. Head: Atraumatic. Nose: No congestion/rhinnorhea. Mouth/Throat: Mucous membranes are moist. Neck: No stridor.  Cardiovascular: Normal rate, regular rhythm. Grossly normal heart sounds.  Good peripheral circulation. Respiratory: Normal respiratory effort.  No retractions. Lungs CTAB. Gastrointestinal: Soft and nontender. No distention. Musculoskeletal: No lower extremity tenderness nor edema. Neurologic: Stuttering dysarthria appears to be expressive in nature.  Left-sided weakness approximately 2 out of 5 strength left lower leg, 3 out of 5 left upper leg.  Normal strength right arm right leg.  Left pronator drift. Skin:  Skin is warm, dry and intact. No rash noted. Psychiatric: Mood and affect are normal. Speech and behavior are normal.  ____________________________________________   LABS (all labs ordered are listed, but only abnormal results are displayed)  Labs Reviewed  RESP PANEL BY RT-PCR (FLU A&B, COVID) ARPGX2 - Abnormal; Notable for the following components:      Result Value   SARS Coronavirus 2 by RT PCR POSITIVE (*)    All other components within normal limits  CBC - Abnormal; Notable for the following components:   Hemoglobin 10.3 (*)    HCT 33.4 (*)    MCH 25.8 (*)    All other components within normal limits  COMPREHENSIVE METABOLIC PANEL - Abnormal; Notable for the following components:   Potassium 3.2 (*)    CO2 17 (*)    Glucose, Bld 380 (*)    Calcium 7.5 (*)    Total Protein 6.3 (*)    Albumin 3.0 (*)    All other components within normal limits  URINALYSIS, COMPLETE (UACMP) WITH MICROSCOPIC - Abnormal; Notable for the following components:   Color, Urine YELLOW (*)    APPearance HAZY (*)    Glucose, UA >=500 (*)    Hgb urine dipstick LARGE (*)    Protein, ur 100 (*)    Leukocytes,Ua LARGE (*)    Bacteria, UA FEW  (*)    All other components within normal limits  LACTIC ACID, PLASMA - Abnormal; Notable for the following components:   Lactic Acid, Venous 2.2 (*)    All other components within normal limits  URINE CULTURE  MRSA PCR SCREENING  PROTIME-INR  APTT  DIFFERENTIAL  BLOOD GAS, VENOUS  BETA-HYDROXYBUTYRIC ACID  TSH  LIPID PANEL  HEMOGLOBIN A1C  METHYLMALONIC ACID, SERUM  PROCALCITONIN  PROCALCITONIN  I-STAT CREATININE, ED  CBG MONITORING, ED  CBG MONITORING, ED   ____________________________________________  EKG  Reviewed by me at 1310 Heart rate 110 QRS 80 QTC 450 Sinus tachycardia, LVH.  No evidence of acute ischemia ____________________________________________  RADIOLOGY  CT HEAD WO CONTRAST  Result Date: 09/27/2020 CLINICAL DATA:  Fall with head and neck trauma.  Left-sided weakness EXAM: CT HEAD WITHOUT CONTRAST CT CERVICAL SPINE WITHOUT CONTRAST TECHNIQUE: Multidetector CT imaging of the head and cervical spine was performed following the standard protocol without intravenous contrast. Multiplanar CT image reconstructions of the cervical spine were also generated. COMPARISON:  None. FINDINGS: CT HEAD FINDINGS Brain: No evidence of acute infarction, hemorrhage, hydrocephalus, extra-axial collection or mass lesion/mass effect. Scattered low-density changes within the periventricular and subcortical white matter compatible with chronic microvascular ischemic change. Mild diffuse cerebral volume loss. Vascular: Atherosclerotic calcifications involving the large vessels of the skull base. No unexpected hyperdense vessel. Skull: Normal. Negative for fracture or focal lesion. Sinuses/Orbits: New near complete opacification of the right frontal sinus. Chronic partial opacification of the left mastoid air cells. Visualized orbital structures appear within normal limits. Other: None. CT CERVICAL SPINE FINDINGS Alignment: Facet joints are aligned without dislocation or traumatic  listhesis. Dens and lateral masses are aligned. Straightening of the cervical lordosis. Skull base and vertebrae: No acute fracture. No primary bone lesion or focal pathologic process. Soft tissues and spinal canal: No prevertebral fluid or swelling. No visible canal hematoma. Disc levels: Mild degenerative disc disease of C5-6. Findings similar to prior.  Upper chest: Included lung apices are clear. Other: None. IMPRESSION: 1. No acute intracranial findings. 2. No evidence of acute fracture or traumatic listhesis of the cervical spine. 3. New right frontal sinus opacification.  Correlate for sinusitis. 4. Chronic nonspecific partial opacification of the left mastoid air cells. Electronically Signed   By: Davina Poke D.O.   On: 09/27/2020 13:50   CT Cervical Spine Wo Contrast  Result Date: 09/27/2020 CLINICAL DATA:  Fall with head and neck trauma.  Left-sided weakness EXAM: CT HEAD WITHOUT CONTRAST CT CERVICAL SPINE WITHOUT CONTRAST TECHNIQUE: Multidetector CT imaging of the head and cervical spine was performed following the standard protocol without intravenous contrast. Multiplanar CT image reconstructions of the cervical spine were also generated. COMPARISON:  None. FINDINGS: CT HEAD FINDINGS Brain: No evidence of acute infarction, hemorrhage, hydrocephalus, extra-axial collection or mass lesion/mass effect. Scattered low-density changes within the periventricular and subcortical white matter compatible with chronic microvascular ischemic change. Mild diffuse cerebral volume loss. Vascular: Atherosclerotic calcifications involving the large vessels of the skull base. No unexpected hyperdense vessel. Skull: Normal. Negative for fracture or focal lesion. Sinuses/Orbits: New near complete opacification of the right frontal sinus. Chronic partial opacification of the left mastoid air cells. Visualized orbital structures appear within normal limits. Other: None. CT CERVICAL SPINE FINDINGS Alignment: Facet  joints are aligned without dislocation or traumatic listhesis. Dens and lateral masses are aligned. Straightening of the cervical lordosis. Skull base and vertebrae: No acute fracture. No primary bone lesion or focal pathologic process. Soft tissues and spinal canal: No prevertebral fluid or swelling. No visible canal hematoma. Disc levels: Mild degenerative disc disease of C5-6. Findings similar to prior. Upper chest: Included lung apices are clear. Other: None. IMPRESSION: 1. No acute intracranial findings. 2. No evidence of acute fracture or traumatic listhesis of the cervical spine. 3. New right frontal sinus opacification.  Correlate for sinusitis. 4. Chronic nonspecific partial opacification of the left mastoid air cells. Electronically Signed   By: Davina Poke D.O.   On: 09/27/2020 13:50   DG Chest Port 1 View  Result Date: 09/27/2020 CLINICAL DATA:  Possible stroke EXAM: PORTABLE CHEST 1 VIEW COMPARISON:  08/27/2020 FINDINGS: Mildly diminished lung volumes. Borderline to mild cardiomegaly. No focal opacity, pleural effusion or pneumothorax IMPRESSION: No active disease. Borderline to mild cardiomegaly. Electronically Signed   By: Donavan Foil M.D.   On: 09/27/2020 19:21    CT head reviewed negative for acute intracranial findings.  Some right frontal sinus opacification.  Chest x-ray reviewed no acute findings except for cardiomegaly mild possible ____________________________________________   PROCEDURES  Procedure(s) performed: None  Procedures  Critical Care performed: No  ____________________________________________   INITIAL IMPRESSION / ASSESSMENT AND PLAN / ED COURSE  Pertinent labs & imaging results that were available during my care of the patient were reviewed by me and considered in my medical decision making (see chart for details).   Patient presents with multiple concerns including left-sided weakness, well outside of any tPA or acute neuro interventional window  in the event this is a stroke.  CT head negative for obvious acute intracranial findings.  Consult has been placed to teleneurology (Dr. Lorrin Goodell) who reviewed consult request in [place].  Not yet received consult recommendations as of 8 PM.  Additionally, patient failed swallow study, thus deferring to neurology for choice of antiplatelet therapy as she does report aspirin allergy.  Patient admitted to Dr. Tobie Poet of the hospitalist service  Discussed with the patient concerns for urinary tract  infection possible stroke, COVID-19, hyperglycemia.  Patient's pH normal, beta hydroxybutyrate acid normal, arguing against acute DKA but suggestive of possible metabolic acidosis.  Clinical Course as of 09/27/20 2010  Tue Sep 27, 2020  1825 COVID positive.  Notified and discussed with patient. [MQ]    Clinical Course User Index [MQ] Delman Kitten, MD   Reassuring cardiac, pulmonary and abdominal exam.  Concern for possible stroke, further work-up under hospitalist service and consult has been placed and reviewed by neurology.  ____________________________________________   FINAL CLINICAL IMPRESSION(S) / ED DIAGNOSES  Final diagnoses:  [id number]  Left-sided muscle weakness  Urinary tract infection, acute        Note:  This document was prepared using Dragon voice recognition software and may include unintentional dictation errors       Delman Kitten, MD 09/27/20 2010

## 2020-09-27 NOTE — ED Triage Notes (Signed)
Presents via EMS from home  States she had some possible stroke sx's which started about 3-4 days ago  Blurred vision  Also blood sugar is elevated  fsbs per EMS 514

## 2020-09-27 NOTE — ED Notes (Addendum)
Took over care of pt. Pt resting comfortably. Pt in NAD at this time. VSS. Awaiting further orders. Will continue to monitor.

## 2020-09-27 NOTE — ED Notes (Signed)
Report off to [employer]

## 2020-09-27 NOTE — H&P (Signed)
History and Physical   [name] LDJ:[id number] DOB: 1956-01-23 DOA: 09/27/2020  PCP: Rita Koch, NP  Outpatient Specialists: [facility] [facility] Patient coming from: home via EMS  I have personally briefly reviewed patient's old medical records in [place].  Chief Concern: stroke-like symptoms  HPI: [name] is a 65 y.o. female with medical history significant for history of afib not on anticoagulation, frequent and multiple falls in the last 3-4 years, hypertension, fibromyalgia, obesity, presented to the emergency department for chief concerns of strokelike symptoms.  She endorses difficulty speaking and walking for 3 days. She endorses numbness in the left side for 5 days.  She states that she did not present to the emergency department sooner because she had a cardiology appointment today, 09/27/2020.  She also endorses new shortness of breath and cough.  She fell three times prior to presentation and endorsed head trauma and endorsed lost of consicousness. She states she was out for several minutes and woke up in the kitchen. Her spouse heard her fall and called EMS   She also endorsed bowel incontinence when she came-to from the syncopal event.  Social history: lives with husband, formerly worked as a [name], she is currently disabled. She denies tobacco use, etoh use, recreational drug use.  Vaccinations: she is fully vaccinated for covid with three doses  ROS: Constitutional: no weight change, no fever ENT/Mouth: no sore throat, no rhinorrhea, dysphagia Eyes: no eye pain, no vision changes Cardiovascular: no chest pain, + dyspnea,  no edema, + palpitations Respiratory: no cough, no sputum, no wheezing Gastrointestinal: no nausea, no vomiting, no diarrhea, no constipation Genitourinary: no urinary incontinence, + dysuria, no hematuria Musculoskeletal: no arthralgias, + myalgias, muscle weakness Skin: no skin lesions, no pruritus, Neuro: +  weakness,+ loss of consciousness, + syncope Psych: no anxiety, no depression, + decrease appetite Heme/Lymph: no bruising, no bleeding  ED Course: Discussed with ED provider, patient requiring hospitalization due to strokelike symptoms.  Assessment/Plan  Principal Problem:   Stroke [facility]) Active Problems:   DEPRESSION/ANXIETY   Essential hypertension   Atrial fibrillation (HCC)   UTI (urinary tract infection)   PAF (paroxysmal atrial fibrillation) (HCC)   Chronic generalized pain   CAD (coronary artery disease)   GAD (generalized anxiety disorder)   CKD (chronic kidney disease), stage III (HCC)   Stroke-like symptoms - etiology includes PAF vs covid 19 infection - CT head without contrast read as: No acute intracranial findings, no evidence of acute fracture or traumatic listhesis of the cervical spine.  New right frontal sinus opacification.  Correlate with for sinusitis. - Neurology has been consulted and we appreciate further recommendations - Complete echo with bubble study ordered - MRI of the brain and MRA of the brain - Fasting lipid and A1c ordered - Checking B12 and TSH - Labetalol injection 10 mg IV q2h prn for SBP > 160 mmHg - Frequent neuro vascular checks - N.p.o. pending SLP - PT, OT, SLP - Fall precaution and aspiration precaution - Admit to progressive cardiac observation with telemetry monitoring  Paroxysmal atrial fibrillation- patient not on anticoagulation suspect secondary to history of multiple and frequent falls in the last 3 to 4 years - Cardiology has been consulted, Dr. Rockey [name] via secure chat - Has bled score is at least 4, hypertension, stroke, age greater than 59, taking aspirin - CHA2DS2-VASc is at least 84: Age, female, CHF history, hypertension, stroke/TIA/thromboembolism, diabetes history, vascular disease - Resumed home metoprolol tartrate 25 mg  p.o. twice daily - Awaiting further recommendations from cardiology  COVID-19 infection- patient is  saturating on room air though she does endorse shortness of breath - Portable chest x-ray ordered and if there is evidence of pneumonia, will initiate treatment at that time - Pro-Cal ordered  Hypertension-resumed metoprolol tartrate 25 mg p.o. twice daily - Hydralazine 25 mg p.o. as needed every 6 hours for SBP greater than 150 - Awaiting further recommendations from neurology  Urinary tract infection and possible complicated UTI vs pyelonephritis suspected-urine culture is in process - Right flank pain - Ceftriaxone 1 g IV daily pending urine culture  Insulin-dependent diabetes mellitus 2 Hyperglycemia suspect secondary to DM2 - resumed home detemir 30 units daily, insulin SSI with at bedtime coverage ordered  Metabolic acidosis without anion gap- low clinical suspicion for DKA or HHS at this time  Hyperlipidemia-resumed home atorvastatin 40 mg p.o. nightly  Chart reviewed.   DVT prophylaxis: SCDs Code Status: Full code Diet: N.p.o. Family Communication: no Disposition Plan: Pending neurology evaluation and clinical course Consults called: Neurology Admission status: Admit to observation to cardiac progressive unit  Past Medical History:  Diagnosis Date  . Acute encephalopathy 05/22/2018  . Allergy   . Anginal pain ([place])   . Anxiety   . Ascites   . C. difficile colitis 07/10/2015  . Cancer (HCC)    HX OF CANCER OF UTERUS   . Cirrhosis of liver not due to alcohol ([place]) 2016  . Degenerative disk disease   . Diverticulitis   . Gastroparesis   . GERD (gastroesophageal reflux disease)   . History of hiatal hernia   . Hypertension   . Hypothyroid   . Hypothyroidism due to amiodarone   . Ileus ([place]) 08/01/2015  . Intussusception intestine ([place]) 05/2015  . Orthostatic hypotension   . PAF (paroxysmal atrial fibrillation) ([place]) 03/2015   a. new onset 03/2015 in setting of intractable N/V; b. on Eliquis 5 mg bid; c. CHADSVASc 4 (DM, TIA x 2, female)  . Pancreatitis   .  Pneumonia 11/14/2015  . Pneumonia 07/2020  . Right ureteral stone 07/14/2016  . Sepsis ([place]) 12/15/2017  . Sick sinus syndrome ([place])   . Stomach ulcer   . Stroke [facility])    with minimal left sided weakness  . Syncope 01/2015  . Syncope due to orthostatic hypotension 05/18/2015  . Tachyarrhythmia 01/10/2016  . TIA (transient ischemic attack) 02/2015  . Type 1 diabetes ([place])    on levemir  . UTERINE CANCER, HX OF 03/27/2007   Qualifier: Diagnosis of  By: Maxie Better FNP, Rosalita Levan   . UTI (urinary tract infection) 05/22/2018   Past Surgical History:  Procedure Laterality Date  . ABDOMINAL HYSTERECTOMY    . CARDIAC CATHETERIZATION N/A 01/12/2016   Procedure: Left Heart Cath and Coronary Angiography;  Surgeon: Wellington Hampshire, MD;  Location: [place] CV LAB;  Service: Cardiovascular;  Laterality: N/A;  . CHOLECYSTECTOMY    . CYSTOSCOPY W/ RETROGRADES Bilateral 12/11/2019   Procedure: CYSTOSCOPY WITH RETROGRADE PYELOGRAM;  Surgeon: Billey Co, MD;  Location: [facility] ORS;  Service: Urology;  Laterality: Bilateral;  . CYSTOSCOPY WITH BIOPSY N/A 12/11/2019   Procedure: CYSTOSCOPY WITH BLADDER BIOPSY;  Surgeon: Billey Co, MD;  Location: [facility] ORS;  Service: Urology;  Laterality: N/A;  . CYSTOSCOPY WITH FULGERATION N/A 12/11/2019   Procedure: CYSTOSCOPY WITH FULGERATION;  Surgeon: Billey Co, MD;  Location: [facility] ORS;  Service: Urology;  Laterality: N/A;  . CYSTOSCOPY WITH STENT PLACEMENT Right 12/11/2019  Procedure: CYSTOSCOPY WITH STENT PLACEMENT;  Surgeon: Billey Co, MD;  Location: [facility] ORS;  Service: Urology;  Laterality: Right;  . CYSTOSCOPY WITH URETHRAL DILATATION Right 12/11/2019   Procedure: CYSTOSCOPY WITH URETERAL DILATATION;  Surgeon: Billey Co, MD;  Location: [facility] ORS;  Service: Urology;  Laterality: Right;  . CYSTOSCOPY/URETEROSCOPY/HOLMIUM LASER Right 07/14/2016   Procedure: CYSTOSCOPY/URETEROSCOPY/HOLMIUM LASER;  Surgeon: Alexis Frock, MD;  Location: [facility] ORS;   Service: Urology;  Laterality: Right;  . ESOPHAGOGASTRODUODENOSCOPY N/A 04/04/2015   Procedure: ESOPHAGOGASTRODUODENOSCOPY (EGD);  Surgeon: Hulen Luster, MD;  Location: [facility] ENDOSCOPY;  Service: Endoscopy;  Laterality: N/A;  . ESOPHAGOGASTRODUODENOSCOPY N/A 12/28/2017   Procedure: ESOPHAGOGASTRODUODENOSCOPY (EGD);  Surgeon: Lin Landsman, MD;  Location: [facility] ENDOSCOPY;  Service: Gastroenterology;  Laterality: N/A;  . ESOPHAGOGASTRODUODENOSCOPY (EGD) WITH PROPOFOL N/A 01/18/2016   Procedure: ESOPHAGOGASTRODUODENOSCOPY (EGD) WITH PROPOFOL;  Surgeon: Lucilla Lame, MD;  Location: [facility] ENDOSCOPY;  Service: Endoscopy;  Laterality: N/A;  . FLEXIBLE SIGMOIDOSCOPY N/A 01/18/2016   Procedure: FLEXIBLE SIGMOIDOSCOPY;  Surgeon: Lucilla Lame, MD;  Location: [facility] ENDOSCOPY;  Service: Endoscopy;  Laterality: N/A;  . HERNIA REPAIR    . URETEROSCOPY Right 12/11/2019   Procedure: Christen Butter;  Surgeon: Billey Co, MD;  Location: [facility] ORS;  Service: Urology;  Laterality: Right;   Social History:  reports that she has quit smoking. Her smoking use included cigarettes. She has never used smokeless tobacco. She reports that she does not drink alcohol and does not use drugs.  Allergies  Allergen Reactions  . Aspirin Rash  . Codeine Sulfate Rash  . Erythromycin Rash    Reaction:  Fever   . Prednisone Swelling  . Rosiglitazone Maleate Swelling  . Tetanus-Diphtheria Toxoids Td Rash and Other (See Comments)    Reaction:  Fever    Family History  Problem Relation Age of Onset  . Hypertension Mother   . CAD Sister   . Heart attack Sister        Deceased 2014-11-28  . CAD Brother    Family history: Family history reviewed and not pertinent  Prior to Admission medications   Medication Sig Start Date End Date Taking? Authorizing Provider  albuterol (VENTOLIN HFA) 108 (90 Base) MCG/ACT inhaler Inhale 2 puffs into the lungs every 4 (four) hours as needed for wheezing or shortness of breath. 07/18/20    Elodia Florence., MD  atorvastatin (LIPITOR) 40 MG tablet Take 40 mg by mouth at bedtime.    [provider]  cyclobenzaprine (FLEXERIL) 10 MG tablet Take 1 tablet (10 mg total) by mouth 3 (three) times daily as needed for muscle spasms. Must last 30 days. Patient taking differently: Take 10 mg by mouth 3 (three) times daily. Must last 30 days. 07/07/20 10/05/20  Milinda Pointer, MD  glucose blood (ACCU-CHEK AVIVA PLUS) test strip Use as instructed to test blood sugar up to 4 times daily 10/09/19   Rita Koch, NP  insulin detemir (LEVEMIR FLEXTOUCH) 100 UNIT/ML FlexPen INJECT 30 UNITS SUBCUTANEOUSLY EVERY MORNING AND AT BEDTIME 08/19/20   Rita Koch, NP  Melatonin 10 MG CAPS Take 1 tablet by mouth at bedtime.    [provider]  metoprolol tartrate (LOPRESSOR) 25 MG tablet Take 25 mg by mouth 2 (two) times daily. 04/08/20   [provider]  nitroGLYCERIN (NITROSTAT) 0.3 MG SL tablet Place 1 tablet (0.3 mg total) under the tongue every 5 (five) minutes as needed for chest pain. 07/24/19 07/23/20  Nena Polio, MD  oxyCODONE (OXY IR/ROXICODONE)  5 MG immediate release tablet Take 1 tablet (5 mg total) by mouth 4 (four) times daily for 7 days. Max: 4/day. Must last 7 days. 08/24/20 08/31/20  Milinda Pointer, MD  oxyCODONE (OXY IR/ROXICODONE) 5 MG immediate release tablet Take 1 tablet (5 mg total) by mouth 3 (three) times daily for 7 days. Max: 3/day. Must last 7 days. 08/31/20 09/07/20  Milinda Pointer, MD  oxyCODONE (OXY IR/ROXICODONE) 5 MG immediate release tablet Take 1 tablet (5 mg total) by mouth 2 (two) times daily for 7 days. Max: 2/day. Must last 7 days. 09/07/20 09/14/20  Milinda Pointer, MD  oxyCODONE (OXY IR/ROXICODONE) 5 MG immediate release tablet Take 1 tablet (5 mg total) by mouth daily for 7 days. Max: 1/day. Must last 7 days. 09/14/20 09/21/20  Milinda Pointer, MD  pantoprazole (PROTONIX) 40 MG tablet Take 1 tablet (40 mg total) by  mouth daily. 07/18/20 08/17/20  Elodia Florence., MD  sertraline (ZOLOFT) 50 MG tablet TAKE 1 TABLET BY MOUTH DAILY 09/12/20   Rita Koch, NP  Spacer/Aero-Hold Chamber Bags MISC Use spacer with inhaler when you have shortness of breath 07/18/20   Elodia Florence., MD  TECHLITE PEN NEEDLES 32G X 6 MM MISC USE TWICE DAILY AS DIRECTED 09/12/20   Rita Koch, NP   Physical Exam: Vitals:   09/27/20 1315 09/27/20 1557 09/27/20 1645 09/27/20 1700  BP: (!) 157/85 (!) 147/93    Pulse: (!) 113 85 (!) 105 (!) 106  Resp: 16 16    Temp: 98.1 F (36.7 C) 98.1 F (36.7 C)    TempSrc: Oral Oral    SpO2: 100% 100% 100% 100%  Weight:      Height:       Constitutional: appears older than chronological age, NAD, calm, comfortable, left sided mouth drooping Eyes: PERRL, lids and conjunctivae normal ENMT: Mucous membranes are moist. Posterior pharynx clear of any exudate or lesions. Multiple teeth loss and poor dentition. Hearing appropriate Neck: normal, supple, no masses, no thyromegaly Respiratory: clear to auscultation bilaterally, no wheezing, no crackles. Normal respiratory effort. No accessory muscle use.  Cardiovascular: Irregular rate and rhythm, no murmurs / rubs / gallops. No extremity edema. 2+ pedal pulses. No carotid bruits.  Abdomen: obese abdomen, no tenderness, no masses palpated, no hepatosplenomegaly. Bowel sounds positive.  Musculoskeletal: no clubbing / cyanosis. No joint deformity upper and lower extremities. Good ROM, no contractures, no atrophy. Normal muscle tone. Right CVA tenderness Skin: no rashes, lesions, ulcers. No induration Neurologic: Sensation intact in right side of face and body and extremties. Sensation not intact in left side of face, body, or extreities. Strength is 3/5 in left lower extremities, left sided facial droop; strength 4/5 in left upper extremities Psychiatric: Normal judgment and insight. Alert and oriented x 3. Normal mood.   EKG:  independently reviewed, showing sinus tachycardia with rate of 110, QTc 449  Chest x-ray on Admission: I personally reviewed and I agree with radiologist reading as below.  CT HEAD WO CONTRAST  Result Date: 09/27/2020 CLINICAL DATA:  Fall with head and neck trauma.  Left-sided weakness EXAM: CT HEAD WITHOUT CONTRAST CT CERVICAL SPINE WITHOUT CONTRAST TECHNIQUE: Multidetector CT imaging of the head and cervical spine was performed following the standard protocol without intravenous contrast. Multiplanar CT image reconstructions of the cervical spine were also generated. COMPARISON:  None. FINDINGS: CT HEAD FINDINGS Brain: No evidence of acute infarction, hemorrhage, hydrocephalus, extra-axial collection or mass lesion/mass effect. Scattered low-density changes within the  periventricular and subcortical white matter compatible with chronic microvascular ischemic change. Mild diffuse cerebral volume loss. Vascular: Atherosclerotic calcifications involving the large vessels of the skull base. No unexpected hyperdense vessel. Skull: Normal. Negative for fracture or focal lesion. Sinuses/Orbits: New near complete opacification of the right frontal sinus. Chronic partial opacification of the left mastoid air cells. Visualized orbital structures appear within normal limits. Other: None. CT CERVICAL SPINE FINDINGS Alignment: Facet joints are aligned without dislocation or traumatic listhesis. Dens and lateral masses are aligned. Straightening of the cervical lordosis. Skull base and vertebrae: No acute fracture. No primary bone lesion or focal pathologic process. Soft tissues and spinal canal: No prevertebral fluid or swelling. No visible canal hematoma. Disc levels: Mild degenerative disc disease of C5-6. Findings similar to prior. Upper chest: Included lung apices are clear. Other: None. IMPRESSION: 1. No acute intracranial findings. 2. No evidence of acute fracture or traumatic listhesis of the cervical spine. 3.  New right frontal sinus opacification.  Correlate for sinusitis. 4. Chronic nonspecific partial opacification of the left mastoid air cells. Electronically Signed   By: Davina Poke D.O.   On: 09/27/2020 13:50   CT Cervical Spine Wo Contrast  Result Date: 09/27/2020 CLINICAL DATA:  Fall with head and neck trauma.  Left-sided weakness EXAM: CT HEAD WITHOUT CONTRAST CT CERVICAL SPINE WITHOUT CONTRAST TECHNIQUE: Multidetector CT imaging of the head and cervical spine was performed following the standard protocol without intravenous contrast. Multiplanar CT image reconstructions of the cervical spine were also generated. COMPARISON:  None. FINDINGS: CT HEAD FINDINGS Brain: No evidence of acute infarction, hemorrhage, hydrocephalus, extra-axial collection or mass lesion/mass effect. Scattered low-density changes within the periventricular and subcortical white matter compatible with chronic microvascular ischemic change. Mild diffuse cerebral volume loss. Vascular: Atherosclerotic calcifications involving the large vessels of the skull base. No unexpected hyperdense vessel. Skull: Normal. Negative for fracture or focal lesion. Sinuses/Orbits: New near complete opacification of the right frontal sinus. Chronic partial opacification of the left mastoid air cells. Visualized orbital structures appear within normal limits. Other: None. CT CERVICAL SPINE FINDINGS Alignment: Facet joints are aligned without dislocation or traumatic listhesis. Dens and lateral masses are aligned. Straightening of the cervical lordosis. Skull base and vertebrae: No acute fracture. No primary bone lesion or focal pathologic process. Soft tissues and spinal canal: No prevertebral fluid or swelling. No visible canal hematoma. Disc levels: Mild degenerative disc disease of C5-6. Findings similar to prior. Upper chest: Included lung apices are clear. Other: None. IMPRESSION: 1. No acute intracranial findings. 2. No evidence of acute fracture  or traumatic listhesis of the cervical spine. 3. New right frontal sinus opacification.  Correlate for sinusitis. 4. Chronic nonspecific partial opacification of the left mastoid air cells. Electronically Signed   By: Davina Poke D.O.   On: 09/27/2020 13:50   Labs on Admission: I have personally reviewed following labs  CBC: Recent Labs  Lab 09/27/20 1319  WBC 5.5  NEUTROABS 4.4  HGB 10.3*  HCT 33.4*  MCV 83.5  PLT 865   Basic Metabolic Panel: Recent Labs  Lab 09/27/20 1319  NA 136  K 3.2*  CL 109  CO2 17*  GLUCOSE 380*  BUN 13  CREATININE 0.95  CALCIUM 7.5*   GFR: Estimated Creatinine Clearance: 57.5 mL/min (by C-G formula based on SCr of 0.95 mg/dL).  Liver Function Tests: Recent Labs  Lab 09/27/20 1319  AST 27  ALT 33  ALKPHOS 120  BILITOT 0.8  PROT 6.3*  ALBUMIN 3.0*  Coagulation Profile: Recent Labs  Lab 09/27/20 1319  INR 1.1   Urine analysis:    Component Value Date/Time   COLORURINE YELLOW (A) 09/27/2020 1547   APPEARANCEUR HAZY (A) 09/27/2020 1547   APPEARANCEUR Cloudy (A) 12/02/2019 1259   LABSPEC 1.027 09/27/2020 1547   LABSPEC 1.033 10/08/2014 0144   PHURINE 6.0 09/27/2020 1547   GLUCOSEU >=500 (A) 09/27/2020 1547   GLUCOSEU >=500 10/08/2014 0144   HGBUR LARGE (A) 09/27/2020 1547   BILIRUBINUR NEGATIVE 09/27/2020 1547   BILIRUBINUR Negative 12/02/2019 1259   BILIRUBINUR Negative 10/08/2014 0144   KETONESUR NEGATIVE 09/27/2020 1547   PROTEINUR 100 (A) 09/27/2020 1547   UROBILINOGEN 0.2 11/04/2019 1157   UROBILINOGEN 0.2 05/27/2015 1936   NITRITE NEGATIVE 09/27/2020 1547   LEUKOCYTESUR LARGE (A) 09/27/2020 1547   LEUKOCYTESUR Negative 10/08/2014 0144   Yitzel Shasteen N Sharlet Notaro D.O. [facility]  If 7PM-7AM, please contact overnight-coverage provider If 7AM-7PM, please contact day coverage provider [email or web address]  09/27/2020, 5:53 PM

## 2020-09-27 NOTE — ED Notes (Addendum)
Lab notified this RN that pt is Covid 19 positive. Jacqualine Code, MD notified. Cox, DO notified.

## 2020-09-28 ENCOUNTER — Observation Stay (HOSPITAL_BASED_OUTPATIENT_CLINIC_OR_DEPARTMENT_OTHER)
Admit: 2020-09-28 | Discharge: 2020-09-28 | Disposition: A | Discharge status: Home / Self Care | Payer: Medicare Other | Attending: Internal Medicine | Admitting: Internal Medicine

## 2020-09-28 DIAGNOSIS — U071 COVID-19: Secondary | ICD-10-CM | POA: Insufficient documentation

## 2020-09-28 DIAGNOSIS — R531 Weakness: Secondary | ICD-10-CM

## 2020-09-28 DIAGNOSIS — R42 Dizziness and giddiness: Secondary | ICD-10-CM

## 2020-09-28 DIAGNOSIS — I6389 Other cerebral infarction: Secondary | ICD-10-CM

## 2020-09-28 DIAGNOSIS — N390 Urinary tract infection, site not specified: Secondary | ICD-10-CM | POA: Diagnosis not present

## 2020-09-28 DIAGNOSIS — I639 Cerebral infarction, unspecified: Secondary | ICD-10-CM

## 2020-09-28 DIAGNOSIS — E1165 Type 2 diabetes mellitus with hyperglycemia: Secondary | ICD-10-CM | POA: Diagnosis not present

## 2020-09-28 DIAGNOSIS — I480 Paroxysmal atrial fibrillation: Secondary | ICD-10-CM

## 2020-09-28 DIAGNOSIS — I951 Orthostatic hypotension: Secondary | ICD-10-CM

## 2020-09-28 LAB — ECHOCARDIOGRAM COMPLETE BUBBLE STUDY
AR max vel: 3.78 cm2
AV Area VTI: 4.68 cm2
AV Area mean vel: 4.07 cm2
AV Mean grad: 1 mmHg
AV Peak grad: 2.8 mmHg
Ao pk vel: 0.84 m/s
Area-P 1/2: 8.82 cm2
MV VTI: 5.24 cm2
S' Lateral: 3.1 cm

## 2020-09-28 LAB — GLUCOSE, CAPILLARY
Glucose-Capillary: 193 mg/dL — ABNORMAL HIGH (ref 70–99)
Glucose-Capillary: 281 mg/dL — ABNORMAL HIGH (ref 70–99)

## 2020-09-28 LAB — LIPID PANEL
Cholesterol: 143 mg/dL (ref 0–200)
HDL: 45 mg/dL (ref >40)
LDL Cholesterol: 76 mg/dL (ref 0–99)
Total CHOL/HDL Ratio: 3.2 RATIO
Triglycerides: 111 mg/dL (ref <150)
VLDL: 22 mg/dL (ref 0–40)

## 2020-09-28 LAB — PROCALCITONIN: Procalcitonin: <0.1 ng/mL

## 2020-09-28 LAB — HEMOGLOBIN A1C
Hgb A1c MFr Bld: 9.1 % — ABNORMAL HIGH (ref 4.8–5.6)
Mean Plasma Glucose: 214.47 mg/dL

## 2020-09-28 LAB — CBG MONITORING, ED
Glucose-Capillary: 208 mg/dL — ABNORMAL HIGH (ref 70–99)
Glucose-Capillary: 160 mg/dL — ABNORMAL HIGH (ref 70–99)

## 2020-09-28 MED ORDER — CYCLOBENZAPRINE HCL 10 MG PO TABS
10.0000 mg | ORAL_TABLET | Freq: Three times a day (TID) | ORAL | Status: DC
  Administered 2020-09-28 – 2020-09-29 (×3): 10 mg via ORAL
  Filled 2020-09-28 (×3): qty 1

## 2020-09-28 MED ORDER — OXYCODONE HCL 5 MG PO TABS
5.0000 mg | ORAL_TABLET | Freq: Every day | ORAL | Status: DC | PRN
  Administered 2020-09-29 (×2): 5 mg via ORAL
  Filled 2020-09-28 (×2): qty 1

## 2020-09-28 MED ORDER — METOPROLOL TARTRATE 25 MG PO TABS
12.5000 mg | ORAL_TABLET | Freq: Two times a day (BID) | ORAL | Status: DC
  Administered 2020-09-28 – 2020-09-29 (×2): 12.5 mg via ORAL
  Filled 2020-09-28 (×2): qty 1

## 2020-09-28 MED ORDER — SODIUM CHLORIDE 0.9 % IV SOLN: INTRAVENOUS | Status: AC

## 2020-09-28 NOTE — ED Notes (Signed)
Neuro at bedside.

## 2020-09-28 NOTE — Consult Note (Addendum)
Cardiology Consultation:   Patient ID: [name] MRN: [id number]; DOB: 1956/03/04  Admit date: 09/27/2020 Date of Consult: 09/28/2020  Primary Care Provider: [name], NP [facility] Cardiologist: [name], MD  [facility] Electrophysiologist:  None    Patient Profile:   [name] is a 65 y.o. female with a hx of paroxysmal A. fib, TIA, nonobstructive CAD, stenosis, diabetes, frequent mechanical falls who is being seen today for the evaluation of weakness at the request of Dr. Tobie Poet.  History of Present Illness:   [name] is a 65 year old female with history of paroxysmal A. fib, TIA, nonobstructive CAD, cirrhosis, diabetes, frequent mechanical falls presenting to the hospital after a fall.  Patient was sitting in a chair and then got up to walk to the kitchen, states her legs gave up on her.  She fell down hitting her head.  She states having multiple episodes of falls over the past several months.  Denies any loss of consciousness, stool incontinence.  States occasionally having rapid heart rates.  Diagnosed with atrial fibrillation in the past, not started on anticoagulation due to frequent falls.  She states developing word finding difficulty and left-sided weakness over the past 3 days.  Denies shortness of breath but has occasional exertional chest discomfort.  In the ED, head CT and brain MRI with no acute infarct.  EKG showed sinus tachycardia. Orthostatic vitals in the ED apparently revealed orthostasis. COVID-19 testing was positive.   Past Medical History:  Diagnosis Date  . Acute encephalopathy 05/22/2018  . Allergy   . Anginal pain ([place])   . Anxiety   . Ascites   . C. difficile colitis 07/10/2015  . Cancer (HCC)    HX OF CANCER OF UTERUS   . Cirrhosis of liver not due to alcohol ([place]) 2016  . Degenerative disk disease   . Diverticulitis   . Gastroparesis   . GERD (gastroesophageal reflux disease)   . History of hiatal hernia   .  Hypertension   . Hypothyroid   . Hypothyroidism due to amiodarone   . Ileus ([place]) 08/01/2015  . Intussusception intestine ([place]) 05/2015  . Orthostatic hypotension   . PAF (paroxysmal atrial fibrillation) ([place]) 03/2015   a. new onset 03/2015 in setting of intractable N/V; b. on Eliquis 5 mg bid; c. CHADSVASc 4 (DM, TIA x 2, female)  . Pancreatitis   . Pneumonia 11/14/2015  . Pneumonia 07/2020  . Right ureteral stone 07/14/2016  . Sepsis ([place]) 12/15/2017  . Sick sinus syndrome ([place])   . Stomach ulcer   . Stroke [facility])    with minimal left sided weakness  . Syncope 01/2015  . Syncope due to orthostatic hypotension 05/18/2015  . Tachyarrhythmia 01/10/2016  . TIA (transient ischemic attack) 02/2015  . Type 1 diabetes ([place])    on levemir  . UTERINE CANCER, HX OF 03/27/2007   Qualifier: Diagnosis of  By: Maxie Better FNP, Rosalita Levan   . UTI (urinary tract infection) 05/22/2018    Past Surgical History:  Procedure Laterality Date  . ABDOMINAL HYSTERECTOMY    . CARDIAC CATHETERIZATION N/A 01/12/2016   Procedure: Left Heart Cath and Coronary Angiography;  Surgeon: Wellington Hampshire, MD;  Location: [place] [facility];  Service: Cardiovascular;  Laterality: N/A;  . CHOLECYSTECTOMY    . CYSTOSCOPY W/ RETROGRADES Bilateral 12/11/2019   Procedure: CYSTOSCOPY WITH RETROGRADE PYELOGRAM;  Surgeon: Billey Co, MD;  Location: [facility];  Service: Urology;  Laterality: Bilateral;  . CYSTOSCOPY WITH  BIOPSY N/A 12/11/2019   Procedure: CYSTOSCOPY WITH BLADDER BIOPSY;  Surgeon: Billey Co, MD;  Location: [facility];  Service: Urology;  Laterality: N/A;  . CYSTOSCOPY WITH FULGERATION N/A 12/11/2019   Procedure: CYSTOSCOPY WITH FULGERATION;  Surgeon: Billey Co, MD;  Location: [facility];  Service: Urology;  Laterality: N/A;  . CYSTOSCOPY WITH STENT PLACEMENT Right 12/11/2019   Procedure: CYSTOSCOPY WITH STENT PLACEMENT;  Surgeon: Billey Co, MD;  Location: [facility];  Service: Urology;  Laterality: Right;   . CYSTOSCOPY WITH URETHRAL DILATATION Right 12/11/2019   Procedure: CYSTOSCOPY WITH URETERAL DILATATION;  Surgeon: Billey Co, MD;  Location: [facility];  Service: Urology;  Laterality: Right;  . CYSTOSCOPY/URETEROSCOPY/HOLMIUM LASER Right 07/14/2016   Procedure: CYSTOSCOPY/URETEROSCOPY/HOLMIUM LASER;  Surgeon: Alexis Frock, MD;  Location: [facility];  Service: Urology;  Laterality: Right;  . ESOPHAGOGASTRODUODENOSCOPY N/A 04/04/2015   Procedure: ESOPHAGOGASTRODUODENOSCOPY (EGD);  Surgeon: Hulen Luster, MD;  Location: [facility] ENDOSCOPY;  Service: Endoscopy;  Laterality: N/A;  . ESOPHAGOGASTRODUODENOSCOPY N/A 12/28/2017   Procedure: ESOPHAGOGASTRODUODENOSCOPY (EGD);  Surgeon: Lin Landsman, MD;  Location: [facility] ENDOSCOPY;  Service: Gastroenterology;  Laterality: N/A;  . ESOPHAGOGASTRODUODENOSCOPY (EGD) WITH PROPOFOL N/A 01/18/2016   Procedure: ESOPHAGOGASTRODUODENOSCOPY (EGD) WITH PROPOFOL;  Surgeon: Lucilla Lame, MD;  Location: [facility] ENDOSCOPY;  Service: Endoscopy;  Laterality: N/A;  . FLEXIBLE SIGMOIDOSCOPY N/A 01/18/2016   Procedure: FLEXIBLE SIGMOIDOSCOPY;  Surgeon: Lucilla Lame, MD;  Location: [facility] ENDOSCOPY;  Service: Endoscopy;  Laterality: N/A;  . HERNIA REPAIR    . URETEROSCOPY Right 12/11/2019   Procedure: Christen Butter;  Surgeon: Billey Co, MD;  Location: [facility];  Service: Urology;  Laterality: Right;     Home Medications:  Prior to Admission medications   Medication Sig Start Date End Date Taking? Authorizing Provider  cyclobenzaprine (FLEXERIL) 10 MG tablet Take 1 tablet (10 mg total) by mouth 3 (three) times daily as needed for muscle spasms. Must last 30 days. Patient taking differently: Take 10 mg by mouth 3 (three) times daily. Must last 30 days. 07/07/20 10/05/20 Yes Milinda Pointer, MD  albuterol (VENTOLIN HFA) 108 (90 Base) MCG/ACT inhaler Inhale 2 puffs into the lungs every 4 (four) hours as needed for wheezing or shortness of breath. 07/18/20   Elodia Florence., MD  atorvastatin (LIPITOR) 40 MG tablet Take 40 mg by mouth at bedtime.    [provider]  glucose blood (ACCU-CHEK AVIVA PLUS) test strip Use as instructed to test blood sugar up to 4 times daily 10/09/19   [name], NP  insulin detemir (LEVEMIR FLEXTOUCH) 100 UNIT/ML FlexPen INJECT 30 UNITS SUBCUTANEOUSLY EVERY MORNING AND AT BEDTIME 08/19/20   [name], NP  Melatonin 10 MG CAPS Take 1 tablet by mouth at bedtime.    [provider]  metoprolol tartrate (LOPRESSOR) 25 MG tablet Take 25 mg by mouth 2 (two) times daily. 04/08/20   [provider]  nitroGLYCERIN (NITROSTAT) 0.3 MG SL tablet Place 1 tablet (0.3 mg total) under the tongue every 5 (five) minutes as needed for chest pain. 07/24/19 07/23/20  Nena Polio, MD  oxyCODONE (OXY IR/ROXICODONE) 5 MG immediate release tablet Take 1 tablet (5 mg total) by mouth 4 (four) times daily for 7 days. Max: 4/day. Must last 7 days. 08/24/20 08/31/20  Milinda Pointer, MD  oxyCODONE (OXY IR/ROXICODONE) 5 MG immediate release tablet Take 1 tablet (5 mg total) by mouth 3 (three) times daily for 7 days. Max: 3/day. Must last 7 days. 08/31/20 09/07/20  Milinda Pointer, MD  oxyCODONE (OXY IR/ROXICODONE) 5 MG immediate release tablet Take 1 tablet (5 mg total) by mouth 2 (two) times daily for 7 days. Max: 2/day. Must last 7 days. 09/07/20 09/14/20  Milinda Pointer, MD  oxyCODONE (OXY IR/ROXICODONE) 5 MG immediate release tablet Take 1 tablet (5 mg total) by mouth daily for 7 days. Max: 1/day. Must last 7 days. 09/14/20 09/21/20  Milinda Pointer, MD  pantoprazole (PROTONIX) 40 MG tablet Take 1 tablet (40 mg total) by mouth daily. 07/18/20 08/17/20  Elodia Florence., MD  sertraline (ZOLOFT) 50 MG tablet TAKE 1 TABLET BY MOUTH DAILY Patient taking differently: Take 50 mg by mouth daily. 09/12/20   [name], NP  Spacer/Aero-Hold Chamber Bags MISC Use spacer with inhaler when you have  shortness of breath 07/18/20   Elodia Florence., MD  TECHLITE PEN NEEDLES 32G X 6 MM MISC USE TWICE DAILY AS DIRECTED 09/12/20   [name], NP    Inpatient Medications: Scheduled Meds: .  stroke: mapping our early stages of recovery book   Does not apply Once  . atorvastatin  40 mg Oral QHS  . insulin aspart  0-15 Units Subcutaneous TID WC  . insulin aspart  0-5 Units Subcutaneous QHS  . insulin detemir  30 Units Subcutaneous Q2200  . metoprolol tartrate  25 mg Oral BID   Continuous Infusions: . sodium chloride     PRN Meds: acetaminophen **OR** acetaminophen (TYLENOL) oral liquid 160 mg/5 mL **OR** acetaminophen, albuterol, labetalol, nitroGLYCERIN  Allergies:    Allergies  Allergen Reactions  . Aspirin Rash  . Codeine Sulfate Rash  . Erythromycin Rash    Reaction:  Fever   . Prednisone Swelling  . Rosiglitazone Maleate Swelling  . Tetanus-Diphtheria Toxoids Td Rash and Other (See Comments)    Reaction:  Fever     Social History:   Social History   Socioeconomic History  . Marital status: Married    Spouse name: Bianey Tesoro   . Number of children: Not on file  . Years of education: Not on file  . Highest education level: Not on file  Occupational History  . Occupation: Disabled 2nd back problems  Tobacco Use  . Smoking status: Former Smoker    Types: Cigarettes  . Smokeless tobacco: Never Used  . Tobacco comment: 25 years ago and only smoked occasionally  Vaping Use  . Vaping Use: Never used  Substance and Sexual Activity  . Alcohol use: No  . Drug use: No  . Sexual activity: Not Currently  Other Topics Concern  . Not on file  Social History Narrative   Lives in [place], [place] with her husband and 2 sons.   Social Determinants of Health   Financial Resource Strain: Not on file  Food Insecurity: Not on file  Transportation Needs: Not on file  Physical Activity: Not on file  Stress: Not on file  Social Connections: Not on file  Intimate  Partner Violence: Not on file    Family History:    Family History  Problem Relation Age of Onset  . Hypertension Mother   . CAD Sister   . Heart attack Sister        Deceased December 03, 2014  . CAD Brother      ROS:  Please see the history of present illness.   All other ROS reviewed and negative.     Physical Exam/Data:   Vitals:   09/28/20 0900 09/28/20 1000 09/28/20 1200 09/28/20 1300  BP: (!) 150/87 (!) 145/86 132/67 130/66  Pulse: 88 91 74 72  Resp: 16 17 19 16   Temp:      TempSrc:      SpO2: 100% 96% 98% 100%  Weight:      Height:        Intake/Output Summary (Last 24 hours) at 09/28/2020 1510 Last data filed at 09/27/2020 1709 Gross per 24 hour  Intake 3 ml  Output -  Net 3 ml   Last 3 Weights 09/27/2020 09/27/2020 08/29/2020  Weight (lbs) 171 lb 171 lb 171 lb 15.3 oz  Weight (kg) 77.565 kg 77.565 kg 78 kg     Body mass index is 30.29 kg/m.  General:  Well nourished, well developed, in no acute distress HEENT: normal Lymph: no adenopathy Neck: no JVD Endocrine:  No thryomegaly Vascular: No carotid bruits; FA pulses 2+ bilaterally without bruits  Cardiac:  normal S1, S2; RRR; no murmur  Lungs:  clear to auscultation bilaterally, no wheezing, rhonchi or rales  Abd: soft, nontender, no hepatomegaly  Ext: no edema Musculoskeletal:  No deformities, left lower extremity weakness noted Skin: warm and dry  Neuro: Difficulty with word finding Psych:  Normal affect   EKG:  The EKG was personally reviewed and demonstrates: Sinus tachycardia Telemetry:  Telemetry was personally reviewed and demonstrates: Sinus rhythm seventy-three  Relevant CV Studies: TTE 11 2021 1. Left ventricular ejection fraction, by estimation, is 60 to 65%. The  left ventricle has normal function. The left ventricle has no regional  wall motion abnormalities. There is moderate left ventricular hypertrophy.  Left ventricular diastolic function  could not be evaluated.  2. Right ventricular  systolic function is normal. The right ventricular  size is normal. Tricuspid regurgitation signal is inadequate for assessing  PA pressure.  3. The mitral valve is normal in structure. No evidence of mitral valve  regurgitation. No evidence of mitral stenosis.  4. The aortic valve is normal in structure. Aortic valve regurgitation is  not visualized. Mild aortic valve sclerosis is present, with no evidence  of aortic valve stenosis.  5. No apical window and no subcostal window. Limited images.   Laboratory Data:  High Sensitivity Troponin:  No results for input(s): TROPONINIHS in the last 720 hours.   Chemistry Recent Labs  Lab 09/27/20 1319  NA 136  K 3.2*  CL 109  CO2 17*  GLUCOSE 380*  BUN 13  CREATININE 0.95  CALCIUM 7.5*  GFRNONAA >60  ANIONGAP 10    Recent Labs  Lab 09/27/20 1319  PROT 6.3*  ALBUMIN 3.0*  AST 27  ALT 33  ALKPHOS 120  BILITOT 0.8   Hematology Recent Labs  Lab 09/27/20 1319  WBC 5.5  RBC 4.00  HGB 10.3*  HCT 33.4*  MCV 83.5  MCH 25.8*  MCHC 30.8  RDW 15.5  PLT 159   BNPNo results for input(s): BNP, PROBNP in the last 168 hours.  DDimer No results for input(s): DDIMER in the last 168 hours.   Radiology/Studies:  CT HEAD WO CONTRAST  Result Date: 09/27/2020 CLINICAL DATA:  Fall with head and neck trauma.  Left-sided weakness EXAM: CT HEAD WITHOUT CONTRAST CT CERVICAL SPINE WITHOUT CONTRAST TECHNIQUE: Multidetector CT imaging of the head and cervical spine was performed following the standard protocol without intravenous contrast. Multiplanar CT image reconstructions of the cervical spine were also generated. COMPARISON:  None. FINDINGS: CT HEAD FINDINGS Brain: No evidence of acute infarction, hemorrhage, hydrocephalus, extra-axial collection or mass lesion/mass effect.  Scattered low-density changes within the periventricular and subcortical white matter compatible with chronic microvascular ischemic change. Mild diffuse cerebral volume  loss. Vascular: Atherosclerotic calcifications involving the large vessels of the skull base. No unexpected hyperdense vessel. Skull: Normal. Negative for fracture or focal lesion. Sinuses/Orbits: New near complete opacification of the right frontal sinus. Chronic partial opacification of the left mastoid air cells. Visualized orbital structures appear within normal limits. Other: None. CT CERVICAL SPINE FINDINGS Alignment: Facet joints are aligned without dislocation or traumatic listhesis. Dens and lateral masses are aligned. Straightening of the cervical lordosis. Skull base and vertebrae: No acute fracture. No primary bone lesion or focal pathologic process. Soft tissues and spinal canal: No prevertebral fluid or swelling. No visible canal hematoma. Disc levels: Mild degenerative disc disease of C5-6. Findings similar to prior. Upper chest: Included lung apices are clear. Other: None. IMPRESSION: 1. No acute intracranial findings. 2. No evidence of acute fracture or traumatic listhesis of the cervical spine. 3. New right frontal sinus opacification.  Correlate for sinusitis. 4. Chronic nonspecific partial opacification of the left mastoid air cells. Electronically Signed   By: Davina Poke D.O.   On: 09/27/2020 13:50   CT Cervical Spine Wo Contrast  Result Date: 09/27/2020 CLINICAL DATA:  Fall with head and neck trauma.  Left-sided weakness EXAM: CT HEAD WITHOUT CONTRAST CT CERVICAL SPINE WITHOUT CONTRAST TECHNIQUE: Multidetector CT imaging of the head and cervical spine was performed following the standard protocol without intravenous contrast. Multiplanar CT image reconstructions of the cervical spine were also generated. COMPARISON:  None. FINDINGS: CT HEAD FINDINGS Brain: No evidence of acute infarction, hemorrhage, hydrocephalus, extra-axial collection or mass lesion/mass effect. Scattered low-density changes within the periventricular and subcortical white matter compatible with chronic  microvascular ischemic change. Mild diffuse cerebral volume loss. Vascular: Atherosclerotic calcifications involving the large vessels of the skull base. No unexpected hyperdense vessel. Skull: Normal. Negative for fracture or focal lesion. Sinuses/Orbits: New near complete opacification of the right frontal sinus. Chronic partial opacification of the left mastoid air cells. Visualized orbital structures appear within normal limits. Other: None. CT CERVICAL SPINE FINDINGS Alignment: Facet joints are aligned without dislocation or traumatic listhesis. Dens and lateral masses are aligned. Straightening of the cervical lordosis. Skull base and vertebrae: No acute fracture. No primary bone lesion or focal pathologic process. Soft tissues and spinal canal: No prevertebral fluid or swelling. No visible canal hematoma. Disc levels: Mild degenerative disc disease of C5-6. Findings similar to prior. Upper chest: Included lung apices are clear. Other: None. IMPRESSION: 1. No acute intracranial findings. 2. No evidence of acute fracture or traumatic listhesis of the cervical spine. 3. New right frontal sinus opacification.  Correlate for sinusitis. 4. Chronic nonspecific partial opacification of the left mastoid air cells. Electronically Signed   By: Davina Poke D.O.   On: 09/27/2020 13:50   MR ANGIO HEAD WO CONTRAST  Result Date: 09/27/2020 CLINICAL DATA:  Initial evaluation for acute stroke. EXAM: MRI HEAD WITHOUT CONTRAST MRA HEAD WITHOUT CONTRAST TECHNIQUE: Multiplanar, multiecho pulse sequences of the brain and surrounding structures were obtained without intravenous contrast. Angiographic images of the head were obtained using MRA technique without contrast. COMPARISON:  Prior CT from earlier the same day. FINDINGS: MRI HEAD FINDINGS Brain: Mild diffuse prominence of the CSF containing spaces compatible generalized age-related cerebral atrophy. Patchy and confluent T2/FLAIR hyperintensity within the  periventricular and deep white matter of both cerebral hemispheres most consistent with chronic small vessel ischemic disease, mild to moderate in  nature. Patchy involvement of the central pons with probable superimposed remote lacunar infarct. Subcentimeter focus of FLAIR hyperintensity involving the left occipital cortex likely reflects a small remote cortical infarct (series 22, image 26). Tiny remote right cerebellar infarct noted as well (series 17, image 8). No abnormal foci of restricted diffusion to suggest acute or subacute ischemia. Gray-white matter differentiation maintained. No other areas of remote cortical infarction. No foci of susceptibility artifact to suggest acute or chronic intracranial hemorrhage. No mass lesion, midline shift or mass effect. No hydrocephalus or extra-axial fluid collection. Incidental note made of a partially empty sella. Midline structures intact. Vascular: Major intracranial vascular flow voids are maintained. Skull and upper cervical spine: Craniocervical junction within normal limits. Bone marrow signal intensity within normal limits. Hyperostosis frontalis interna noted. No scalp soft tissue abnormality. Sinuses/Orbits: Globes and orbital soft tissues demonstrate no acute finding. Chronic right frontoethmoidal sinusitis, with additional mild scattered mucosal thickening within the ethmoidal air cells and maxillary sinuses. Superimposed small left maxillary sinus retention cyst noted. Left mastoid and middle ear effusion noted, of uncertain significance. Inner ear structures grossly within normal limits. No abnormality about the visualized nasopharynx. Other: None. MRA HEAD FINDINGS ANTERIOR CIRCULATION: Visualized distal cervical segments of the internal carotid arteries are widely patent with antegrade flow. Petrous, cavernous, and supraclinoid ICAs widely patent without stenosis or other abnormality. A1 segments widely patent. Normal anterior communicating artery  complex. Anterior cerebral arteries widely patent their distal aspects without stenosis. No M1 stenosis or occlusion. Negative MCA bifurcations. Distal MCA branches well perfused and symmetric. POSTERIOR CIRCULATION: Both vertebral arteries widely patent to the vertebrobasilar junction without stenosis. Both PICA origins patent and normal. Basilar patent to its distal aspect without stenosis. Superior cerebellar arteries are patent proximally. Left PCA supplied via the basilar. Predominant fetal type origin of the right PCA. Both PCAs well perfused to their distal aspects without stenosis. No intracranial aneurysm. IMPRESSION: MRI HEAD IMPRESSION: 1. No acute intracranial infarct or other abnormality. 2. Age-related cerebral atrophy with mild-to-moderate chronic small vessel ischemic disease, with a few scattered small remote ischemic infarcts as above. 3. Left mastoid and middle ear effusion, of uncertain significance. Correlation with any symptomatology and physical exam recommended. 4. Chronic right frontoethmoidal sinusitis. MRA HEAD IMPRESSION: Normal intracranial MRA. No large vessel occlusion, hemodynamically significant stenosis, or other acute vascular abnormality. Electronically Signed   By: Jeannine Boga M.D.   On: 09/27/2020 22:37   MR BRAIN WO CONTRAST  Result Date: 09/27/2020 CLINICAL DATA:  Initial evaluation for acute stroke. EXAM: MRI HEAD WITHOUT CONTRAST MRA HEAD WITHOUT CONTRAST TECHNIQUE: Multiplanar, multiecho pulse sequences of the brain and surrounding structures were obtained without intravenous contrast. Angiographic images of the head were obtained using MRA technique without contrast. COMPARISON:  Prior CT from earlier the same day. FINDINGS: MRI HEAD FINDINGS Brain: Mild diffuse prominence of the CSF containing spaces compatible generalized age-related cerebral atrophy. Patchy and confluent T2/FLAIR hyperintensity within the periventricular and deep white matter of both  cerebral hemispheres most consistent with chronic small vessel ischemic disease, mild to moderate in nature. Patchy involvement of the central pons with probable superimposed remote lacunar infarct. Subcentimeter focus of FLAIR hyperintensity involving the left occipital cortex likely reflects a small remote cortical infarct (series 22, image 26). Tiny remote right cerebellar infarct noted as well (series 17, image 8). No abnormal foci of restricted diffusion to suggest acute or subacute ischemia. Gray-white matter differentiation maintained. No other areas of remote cortical infarction. No foci of susceptibility  artifact to suggest acute or chronic intracranial hemorrhage. No mass lesion, midline shift or mass effect. No hydrocephalus or extra-axial fluid collection. Incidental note made of a partially empty sella. Midline structures intact. Vascular: Major intracranial vascular flow voids are maintained. Skull and upper cervical spine: Craniocervical junction within normal limits. Bone marrow signal intensity within normal limits. Hyperostosis frontalis interna noted. No scalp soft tissue abnormality. Sinuses/Orbits: Globes and orbital soft tissues demonstrate no acute finding. Chronic right frontoethmoidal sinusitis, with additional mild scattered mucosal thickening within the ethmoidal air cells and maxillary sinuses. Superimposed small left maxillary sinus retention cyst noted. Left mastoid and middle ear effusion noted, of uncertain significance. Inner ear structures grossly within normal limits. No abnormality about the visualized nasopharynx. Other: None. MRA HEAD FINDINGS ANTERIOR CIRCULATION: Visualized distal cervical segments of the internal carotid arteries are widely patent with antegrade flow. Petrous, cavernous, and supraclinoid ICAs widely patent without stenosis or other abnormality. A1 segments widely patent. Normal anterior communicating artery complex. Anterior cerebral arteries widely patent  their distal aspects without stenosis. No M1 stenosis or occlusion. Negative MCA bifurcations. Distal MCA branches well perfused and symmetric. POSTERIOR CIRCULATION: Both vertebral arteries widely patent to the vertebrobasilar junction without stenosis. Both PICA origins patent and normal. Basilar patent to its distal aspect without stenosis. Superior cerebellar arteries are patent proximally. Left PCA supplied via the basilar. Predominant fetal type origin of the right PCA. Both PCAs well perfused to their distal aspects without stenosis. No intracranial aneurysm. IMPRESSION: MRI HEAD IMPRESSION: 1. No acute intracranial infarct or other abnormality. 2. Age-related cerebral atrophy with mild-to-moderate chronic small vessel ischemic disease, with a few scattered small remote ischemic infarcts as above. 3. Left mastoid and middle ear effusion, of uncertain significance. Correlation with any symptomatology and physical exam recommended. 4. Chronic right frontoethmoidal sinusitis. MRA HEAD IMPRESSION: Normal intracranial MRA. No large vessel occlusion, hemodynamically significant stenosis, or other acute vascular abnormality. Electronically Signed   By: Jeannine Boga M.D.   On: 09/27/2020 22:37   US Carotid Bilateral (at [facility] and [facility] only)  Result Date: 09/27/2020 CLINICAL DATA:  Stroke-like symptoms EXAM: BILATERAL CAROTID DUPLEX ULTRASOUND TECHNIQUE: Pearline Cables scale imaging, color Doppler and duplex ultrasound were performed of bilateral carotid and vertebral arteries in the neck. COMPARISON:  None FINDINGS: Criteria: Quantification of carotid stenosis is based on velocity parameters that correlate the residual internal carotid diameter with NASCET-based stenosis levels, using the diameter of the distal internal carotid lumen as the denominator for stenosis measurement. The following velocity measurements were obtained: RIGHT ICA: 49/12 cm/sec CCA: 50/09 cm/sec SYSTOLIC ICA/CCA RATIO:  0.6 ECA: 56 cm/sec LEFT  ICA: 68/23 cm/sec CCA: 38/18 cm/sec SYSTOLIC ICA/CCA RATIO:  1.2 ECA: 104 cm/sec RIGHT CAROTID ARTERY: Preliminary grayscale images demonstrates tortuosity of the carotid artery. Minimal atherosclerotic plaque is noted. Evaluation of the waveforms, velocities and flow velocity ratios however demonstrate no evidence of focal hemodynamically significant stenosis. RIGHT VERTEBRAL ARTERY:  Antegrade in nature. LEFT CAROTID ARTERY: Preliminary grayscale images demonstrate mild atherosclerotic plaque. Waveforms, velocities and flow velocity ratios however demonstrate no evidence of focal hemodynamically significant stenosis. LEFT VERTEBRAL ARTERY:  Antegrade in nature. IMPRESSION: Minimal atherosclerotic plaque bilaterally. No focal hemodynamically significant carotid stenosis is seen. Electronically Signed   By: Inez Catalina M.D.   On: 09/27/2020 20:15   DG Chest Port 1 View  Result Date: 09/27/2020 CLINICAL DATA:  Possible stroke EXAM: PORTABLE CHEST 1 VIEW COMPARISON:  08/27/2020 FINDINGS: Mildly diminished lung volumes. Borderline to mild cardiomegaly. No focal  opacity, pleural effusion or pneumothorax IMPRESSION: No active disease. Borderline to mild cardiomegaly. Electronically Signed   By: Donavan Foil M.D.   On: 09/27/2020 19:21     Assessment and Plan:   1. History of paroxysmal A. Fib -Currently in sinus rhythm -Noted to be orthostatic in the ED. Decrease beta-blocker to 12.5 mg twice daily -Recommend against full anticoagulation due to frequent falls -Patient is allergic to aspirin. -Previous echo with preserved EF  2. Word finding difficulty, left lower extremity weakness -CT, brain MRI no acute abnormalities -PT OT -Aspirin if patient can tolerate, listed as allergy in the chart.  3. Orthostasis -Orthostatic precautions  4. COVID-19 positive, UTI -Standard precautions, management per primary continue -Antibiotics per primary team  Signed, Kate Sable, MD  09/28/2020 3:10  PM

## 2020-09-28 NOTE — Progress Notes (Signed)
PROGRESS NOTE    [name]  PPI:[id number] DOB: 1955-12-09 DOA: 09/27/2020 PCP: [name], NP    Assessment & Plan:   Principal Problem:   Stroke [facility]) Active Problems:   DEPRESSION/ANXIETY   Essential hypertension   Atrial fibrillation ([place])   Urinary tract infection, acute   PAF (paroxysmal atrial fibrillation) (HCC)   Chronic generalized pain   CAD (coronary artery disease)   GAD (generalized anxiety disorder)   CKD (chronic kidney disease), stage III ([place])   Weakness   CARLEIGH [name] is a 65 y.o. female with medical history significant for history of afib not on anticoagulation, frequent and multiple falls in the last 3-4 years, hypertension, fibromyalgia, obesity, presented to the emergency department for chief concerns of strokelike symptoms.  She endorses difficulty speaking and walking for 3 days. She endorses numbness in the left side for 5 days.  She states that she did not present to the emergency department sooner because she had a cardiology appointment today, 09/27/2020.  She also endorses new shortness of breath and cough.  She fell three times prior to presentation and endorsed head trauma and endorsed lost of consicousness. She states she was out for several minutes and woke up in the kitchen. Her spouse heard her fall and called EMS   Vaccinations: she is fully vaccinated for covid with three doses.   Stroke-like symptoms, acute infarct ruled out --MIR brain showed No new stroke, does have remote lacunar strokes, most notable in pons. --MR Angio head and Carotid duplex was negative for LVO or any significant stenosis. --Neuro consulted Plan: --cont just ASA 81 mg daily, per neuro --cont home statin --Per neuro, "- If she presents with new neurological deficit in the future, if within the acute stroke window, please activate the stroke code pathway. If outside the window for any intervention, would recommend getting just an MRI Brain to start  with and further workup can be obtained after the MRI."  Paroxysmal atrial fibrillation not on anticoagulation  - Cardiology has been consulted Plan: --cont metop at decreased 12.5 mg BID --will not start anticoagulation given her frequent falls  Orthostatic hypotension --likely due to poor hydration Plan: --NS@100  for 10 hours --encourage oral hydration --decrease metop to 12.5 mg BID --non-pharm recs per neuro.  COVID-19 infection - patient is saturating on room air though she does endorse shortness of breath which is chronic - CXR no active disease Plan: --no need to start covid tx.  Hypertension --decrease metop to 12.5 mg BID  Urinary tract infection, ruled out Chronic dysuria -Pt has a hx of pyuria and hematuria and chronic dysuria.  Pt was advised to follow up with urology as outpatient for concern of bladder cancer, but pt now said she doesn't remember. Plan: --d/c ceftriaxone --urology followup as outpatient  Insulin-dependent diabetes mellitus 2 Hyperglycemia  --cont home Levemir 30u nightly --SSI  Hyperlipidemia --cont home statin    DVT prophylaxis: SCD/Compression stockings Code Status: Full code  Family Communication:  Status is: observation Dispo:   The patient is from: home Anticipated d/c is to: home Anticipated d/c date is: tomorrow Patient currently is not medically ready to d/c due to: orthostasis positive   Subjective and Interval History:  Pt reported reduced oral intake PTA, and had been NPO since presentation.  Found to be orthostasis positive.  Reported dizziness when standing.     Objective: Vitals:   09/28/20 1000 09/28/20 1200 09/28/20 1300 09/28/20 1601  BP: (!) 145/86 132/67 130/66 Marland Kitchen)  153/75  Pulse: 91 74 72 70  Resp: 17 19 16 20   Temp:    98.3 F (36.8 C)  TempSrc:      SpO2: 96% 98% 100% 100%  Weight:      Height:       No intake or output data in the 24 hours ending 09/28/20 1750 Filed Weights   09/27/20  1314  Weight: 77.6 kg    Examination:   Constitutional: NAD, AAOx3, stuttered and halting speech HEENT: conjunctivae and lids normal, EOMI CV: No cyanosis.   RESP: normal respiratory effort, on RA Extremities: No effusions, edema in BLE SKIN: warm, dry Neuro: II - XII grossly intact.   Psych: depressed mood and affect.     Data Reviewed: I have personally reviewed following labs and imaging studies  CBC: Recent Labs  Lab 09/27/20 1319  WBC 5.5  NEUTROABS 4.4  HGB 10.3*  HCT 33.4*  MCV 83.5  PLT 284   Basic Metabolic Panel: Recent Labs  Lab 09/27/20 1319  NA 136  K 3.2*  CL 109  CO2 17*  GLUCOSE 380*  BUN 13  CREATININE 0.95  CALCIUM 7.5*   GFR: Estimated Creatinine Clearance: 57.5 mL/min (by C-G formula based on SCr of 0.95 mg/dL). Liver Function Tests: Recent Labs  Lab 09/27/20 1319  AST 27  ALT 33  ALKPHOS 120  BILITOT 0.8  PROT 6.3*  ALBUMIN 3.0*   No results for input(s): LIPASE, AMYLASE in the last 168 hours. No results for input(s): AMMONIA in the last 168 hours. Coagulation Profile: Recent Labs  Lab 09/27/20 1319  INR 1.1   Cardiac Enzymes: No results for input(s): CKTOTAL, CKMB, CKMBINDEX, TROPONINI in the last 168 hours. BNP (last 3 results) No results for input(s): PROBNP in the last 8760 hours. HbA1C: Recent Labs    09/28/20 0647  HGBA1C 9.1*   CBG: Recent Labs  Lab 09/27/20 2223 09/28/20 0755 09/28/20 1137 09/28/20 1723  GLUCAP 260* 208* 160* 193*   Lipid Profile: Recent Labs    09/28/20 0647  CHOL 143  HDL 45  LDLCALC 76  TRIG 111  CHOLHDL 3.2   Thyroid Function Tests: Recent Labs    09/27/20 1652  TSH 3.869   Anemia Panel: No results for input(s): VITAMINB12, FOLATE, FERRITIN, TIBC, IRON, RETICCTPCT in the last 72 hours. Sepsis Labs: Recent Labs  Lab 09/27/20 1652 09/28/20 0647  PROCALCITON <0.10 <0.10  LATICACIDVEN 2.2*  --     Recent Results (from the past 240 hour(s))  Resp Panel by RT-PCR  (Flu A&B, Covid) Nasopharyngeal Swab     Status: Abnormal   Collection Time: 09/27/20  4:52 PM   Specimen: Nasopharyngeal Swab; Nasopharyngeal(NP) swabs in vial transport medium  Result Value Ref Range Status   SARS Coronavirus 2 by RT PCR POSITIVE (A) NEGATIVE Final    Comment: RESULT CALLED TO, READ BACK BY AND VERIFIED WITH: CALLED TO MIMI FRAGART,RN 1801 09/27/20 DB (NOTE) SARS-CoV-2 target nucleic acids are DETECTED.  The SARS-CoV-2 RNA is generally detectable in upper respiratory specimens during the acute phase of infection. Positive results are indicative of the presence of the identified virus, but do not rule out bacterial infection or co-infection with other pathogens not detected by the test. Clinical correlation with patient history and other diagnostic information is necessary to determine patient infection status. The expected result is Negative.  Fact Sheet for Patients: EntrepreneurPulse.com.au  Fact Sheet for Healthcare Providers: IncredibleEmployment.be  This test is not yet approved or cleared by  the [employer] and  has been authorized for detection and/or diagnosis of SARS-CoV-2 by FDA under an Emergency Use Authorization (EUA).  This EUA will remain in effect (meaning this tes t can be used) for the duration of  the COVID-19 declaration under Section 564(b)(1) of the Act, 21 U.S.C. section 360bbb-3(b)(1), unless the authorization is terminated or revoked sooner.     Influenza A by PCR NEGATIVE NEGATIVE Final   Influenza B by PCR NEGATIVE NEGATIVE Final    Comment: (NOTE) The Xpert Xpress SARS-CoV-2/FLU/RSV plus assay is intended as an aid in the diagnosis of influenza from Nasopharyngeal swab specimens and should not be used as a sole basis for treatment. Nasal washings and aspirates are unacceptable for Xpert Xpress SARS-CoV-2/FLU/RSV testing.  Fact Sheet for  Patients: EntrepreneurPulse.com.au  Fact Sheet for Healthcare Providers: IncredibleEmployment.be  This test is not yet approved or cleared by the [place] FDA and has been authorized for detection and/or diagnosis of SARS-CoV-2 by FDA under an Emergency Use Authorization (EUA). This EUA will remain in effect (meaning this test can be used) for the duration of the COVID-19 declaration under Section 564(b)(1) of the Act, 21 U.S.C. section 360bbb-3(b)(1), unless the authorization is terminated or revoked.  Performed at [facility], [address] [place], [place] [address]   MRSA PCR Screening     Status: None   Collection Time: 09/27/20  7:43 PM   Specimen: Nasopharyngeal  Result Value Ref Range Status   MRSA by PCR NEGATIVE NEGATIVE Final    Comment:        The GeneXpert MRSA Assay (FDA approved for NASAL specimens only), is one component of a comprehensive MRSA colonization surveillance program. It is not intended to diagnose MRSA infection nor to guide or monitor treatment for MRSA infections. Performed at [facility], [address], [place], [place] [address]       Radiology Studies: CT HEAD WO CONTRAST  Result Date: 09/27/2020 CLINICAL DATA:  Fall with head and neck trauma.  Left-sided weakness EXAM: CT HEAD WITHOUT CONTRAST CT CERVICAL SPINE WITHOUT CONTRAST TECHNIQUE: Multidetector CT imaging of the head and cervical spine was performed following the standard protocol without intravenous contrast. Multiplanar CT image reconstructions of the cervical spine were also generated. COMPARISON:  None. FINDINGS: CT HEAD FINDINGS Brain: No evidence of acute infarction, hemorrhage, hydrocephalus, extra-axial collection or mass lesion/mass effect. Scattered low-density changes within the periventricular and subcortical white matter compatible with chronic microvascular ischemic change. Mild diffuse cerebral volume loss.  Vascular: Atherosclerotic calcifications involving the large vessels of the skull base. No unexpected hyperdense vessel. Skull: Normal. Negative for fracture or focal lesion. Sinuses/Orbits: New near complete opacification of the right frontal sinus. Chronic partial opacification of the left mastoid air cells. Visualized orbital structures appear within normal limits. Other: None. CT CERVICAL SPINE FINDINGS Alignment: Facet joints are aligned without dislocation or traumatic listhesis. Dens and lateral masses are aligned. Straightening of the cervical lordosis. Skull base and vertebrae: No acute fracture. No primary bone lesion or focal pathologic process. Soft tissues and spinal canal: No prevertebral fluid or swelling. No visible canal hematoma. Disc levels: Mild degenerative disc disease of C5-6. Findings similar to prior. Upper chest: Included lung apices are clear. Other: None. IMPRESSION: 1. No acute intracranial findings. 2. No evidence of acute fracture or traumatic listhesis of the cervical spine. 3. New right frontal sinus opacification.  Correlate for sinusitis. 4. Chronic nonspecific partial opacification of the left mastoid air cells. Electronically  Signed   By: Davina Poke D.O.   On: 09/27/2020 13:50   CT Cervical Spine Wo Contrast  Result Date: 09/27/2020 CLINICAL DATA:  Fall with head and neck trauma.  Left-sided weakness EXAM: CT HEAD WITHOUT CONTRAST CT CERVICAL SPINE WITHOUT CONTRAST TECHNIQUE: Multidetector CT imaging of the head and cervical spine was performed following the standard protocol without intravenous contrast. Multiplanar CT image reconstructions of the cervical spine were also generated. COMPARISON:  None. FINDINGS: CT HEAD FINDINGS Brain: No evidence of acute infarction, hemorrhage, hydrocephalus, extra-axial collection or mass lesion/mass effect. Scattered low-density changes within the periventricular and subcortical white matter compatible with chronic microvascular  ischemic change. Mild diffuse cerebral volume loss. Vascular: Atherosclerotic calcifications involving the large vessels of the skull base. No unexpected hyperdense vessel. Skull: Normal. Negative for fracture or focal lesion. Sinuses/Orbits: New near complete opacification of the right frontal sinus. Chronic partial opacification of the left mastoid air cells. Visualized orbital structures appear within normal limits. Other: None. CT CERVICAL SPINE FINDINGS Alignment: Facet joints are aligned without dislocation or traumatic listhesis. Dens and lateral masses are aligned. Straightening of the cervical lordosis. Skull base and vertebrae: No acute fracture. No primary bone lesion or focal pathologic process. Soft tissues and spinal canal: No prevertebral fluid or swelling. No visible canal hematoma. Disc levels: Mild degenerative disc disease of C5-6. Findings similar to prior. Upper chest: Included lung apices are clear. Other: None. IMPRESSION: 1. No acute intracranial findings. 2. No evidence of acute fracture or traumatic listhesis of the cervical spine. 3. New right frontal sinus opacification.  Correlate for sinusitis. 4. Chronic nonspecific partial opacification of the left mastoid air cells. Electronically Signed   By: Davina Poke D.O.   On: 09/27/2020 13:50   MR ANGIO HEAD WO CONTRAST  Result Date: 09/27/2020 CLINICAL DATA:  Initial evaluation for acute stroke. EXAM: MRI HEAD WITHOUT CONTRAST MRA HEAD WITHOUT CONTRAST TECHNIQUE: Multiplanar, multiecho pulse sequences of the brain and surrounding structures were obtained without intravenous contrast. Angiographic images of the head were obtained using MRA technique without contrast. COMPARISON:  Prior CT from earlier the same day. FINDINGS: MRI HEAD FINDINGS Brain: Mild diffuse prominence of the CSF containing spaces compatible generalized age-related cerebral atrophy. Patchy and confluent T2/FLAIR hyperintensity within the periventricular and deep  white matter of both cerebral hemispheres most consistent with chronic small vessel ischemic disease, mild to moderate in nature. Patchy involvement of the central pons with probable superimposed remote lacunar infarct. Subcentimeter focus of FLAIR hyperintensity involving the left occipital cortex likely reflects a small remote cortical infarct (series 22, image 26). Tiny remote right cerebellar infarct noted as well (series 17, image 8). No abnormal foci of restricted diffusion to suggest acute or subacute ischemia. Gray-white matter differentiation maintained. No other areas of remote cortical infarction. No foci of susceptibility artifact to suggest acute or chronic intracranial hemorrhage. No mass lesion, midline shift or mass effect. No hydrocephalus or extra-axial fluid collection. Incidental note made of a partially empty sella. Midline structures intact. Vascular: Major intracranial vascular flow voids are maintained. Skull and upper cervical spine: Craniocervical junction within normal limits. Bone marrow signal intensity within normal limits. Hyperostosis frontalis interna noted. No scalp soft tissue abnormality. Sinuses/Orbits: Globes and orbital soft tissues demonstrate no acute finding. Chronic right frontoethmoidal sinusitis, with additional mild scattered mucosal thickening within the ethmoidal air cells and maxillary sinuses. Superimposed small left maxillary sinus retention cyst noted. Left mastoid and middle ear effusion noted, of uncertain significance. Inner ear structures  grossly within normal limits. No abnormality about the visualized nasopharynx. Other: None. MRA HEAD FINDINGS ANTERIOR CIRCULATION: Visualized distal cervical segments of the internal carotid arteries are widely patent with antegrade flow. Petrous, cavernous, and supraclinoid ICAs widely patent without stenosis or other abnormality. A1 segments widely patent. Normal anterior communicating artery complex. Anterior cerebral  arteries widely patent their distal aspects without stenosis. No M1 stenosis or occlusion. Negative MCA bifurcations. Distal MCA branches well perfused and symmetric. POSTERIOR CIRCULATION: Both vertebral arteries widely patent to the vertebrobasilar junction without stenosis. Both PICA origins patent and normal. Basilar patent to its distal aspect without stenosis. Superior cerebellar arteries are patent proximally. Left PCA supplied via the basilar. Predominant fetal type origin of the right PCA. Both PCAs well perfused to their distal aspects without stenosis. No intracranial aneurysm. IMPRESSION: MRI HEAD IMPRESSION: 1. No acute intracranial infarct or other abnormality. 2. Age-related cerebral atrophy with mild-to-moderate chronic small vessel ischemic disease, with a few scattered small remote ischemic infarcts as above. 3. Left mastoid and middle ear effusion, of uncertain significance. Correlation with any symptomatology and physical exam recommended. 4. Chronic right frontoethmoidal sinusitis. MRA HEAD IMPRESSION: Normal intracranial MRA. No large vessel occlusion, hemodynamically significant stenosis, or other acute vascular abnormality. Electronically Signed   By: Jeannine Boga M.D.   On: 09/27/2020 22:37   MR BRAIN WO CONTRAST  Result Date: 09/27/2020 CLINICAL DATA:  Initial evaluation for acute stroke. EXAM: MRI HEAD WITHOUT CONTRAST MRA HEAD WITHOUT CONTRAST TECHNIQUE: Multiplanar, multiecho pulse sequences of the brain and surrounding structures were obtained without intravenous contrast. Angiographic images of the head were obtained using MRA technique without contrast. COMPARISON:  Prior CT from earlier the same day. FINDINGS: MRI HEAD FINDINGS Brain: Mild diffuse prominence of the CSF containing spaces compatible generalized age-related cerebral atrophy. Patchy and confluent T2/FLAIR hyperintensity within the periventricular and deep white matter of both cerebral hemispheres most  consistent with chronic small vessel ischemic disease, mild to moderate in nature. Patchy involvement of the central pons with probable superimposed remote lacunar infarct. Subcentimeter focus of FLAIR hyperintensity involving the left occipital cortex likely reflects a small remote cortical infarct (series 22, image 26). Tiny remote right cerebellar infarct noted as well (series 17, image 8). No abnormal foci of restricted diffusion to suggest acute or subacute ischemia. Gray-white matter differentiation maintained. No other areas of remote cortical infarction. No foci of susceptibility artifact to suggest acute or chronic intracranial hemorrhage. No mass lesion, midline shift or mass effect. No hydrocephalus or extra-axial fluid collection. Incidental note made of a partially empty sella. Midline structures intact. Vascular: Major intracranial vascular flow voids are maintained. Skull and upper cervical spine: Craniocervical junction within normal limits. Bone marrow signal intensity within normal limits. Hyperostosis frontalis interna noted. No scalp soft tissue abnormality. Sinuses/Orbits: Globes and orbital soft tissues demonstrate no acute finding. Chronic right frontoethmoidal sinusitis, with additional mild scattered mucosal thickening within the ethmoidal air cells and maxillary sinuses. Superimposed small left maxillary sinus retention cyst noted. Left mastoid and middle ear effusion noted, of uncertain significance. Inner ear structures grossly within normal limits. No abnormality about the visualized nasopharynx. Other: None. MRA HEAD FINDINGS ANTERIOR CIRCULATION: Visualized distal cervical segments of the internal carotid arteries are widely patent with antegrade flow. Petrous, cavernous, and supraclinoid ICAs widely patent without stenosis or other abnormality. A1 segments widely patent. Normal anterior communicating artery complex. Anterior cerebral arteries widely patent their distal aspects without  stenosis. No M1 stenosis or occlusion. Negative MCA bifurcations. Distal  MCA branches well perfused and symmetric. POSTERIOR CIRCULATION: Both vertebral arteries widely patent to the vertebrobasilar junction without stenosis. Both PICA origins patent and normal. Basilar patent to its distal aspect without stenosis. Superior cerebellar arteries are patent proximally. Left PCA supplied via the basilar. Predominant fetal type origin of the right PCA. Both PCAs well perfused to their distal aspects without stenosis. No intracranial aneurysm. IMPRESSION: MRI HEAD IMPRESSION: 1. No acute intracranial infarct or other abnormality. 2. Age-related cerebral atrophy with mild-to-moderate chronic small vessel ischemic disease, with a few scattered small remote ischemic infarcts as above. 3. Left mastoid and middle ear effusion, of uncertain significance. Correlation with any symptomatology and physical exam recommended. 4. Chronic right frontoethmoidal sinusitis. MRA HEAD IMPRESSION: Normal intracranial MRA. No large vessel occlusion, hemodynamically significant stenosis, or other acute vascular abnormality. Electronically Signed   By: Jeannine Boga M.D.   On: 09/27/2020 22:37   US Carotid Bilateral (at [facility] and [facility] only)  Result Date: 09/27/2020 CLINICAL DATA:  Stroke-like symptoms EXAM: BILATERAL CAROTID DUPLEX ULTRASOUND TECHNIQUE: Pearline Cables scale imaging, color Doppler and duplex ultrasound were performed of bilateral carotid and vertebral arteries in the neck. COMPARISON:  None FINDINGS: Criteria: Quantification of carotid stenosis is based on velocity parameters that correlate the residual internal carotid diameter with NASCET-based stenosis levels, using the diameter of the distal internal carotid lumen as the denominator for stenosis measurement. The following velocity measurements were obtained: RIGHT ICA: 49/12 cm/sec CCA: 81/82 cm/sec SYSTOLIC ICA/CCA RATIO:  0.6 ECA: 56 cm/sec LEFT ICA: 68/23 cm/sec CCA: 99/37  cm/sec SYSTOLIC ICA/CCA RATIO:  1.2 ECA: 104 cm/sec RIGHT CAROTID ARTERY: Preliminary grayscale images demonstrates tortuosity of the carotid artery. Minimal atherosclerotic plaque is noted. Evaluation of the waveforms, velocities and flow velocity ratios however demonstrate no evidence of focal hemodynamically significant stenosis. RIGHT VERTEBRAL ARTERY:  Antegrade in nature. LEFT CAROTID ARTERY: Preliminary grayscale images demonstrate mild atherosclerotic plaque. Waveforms, velocities and flow velocity ratios however demonstrate no evidence of focal hemodynamically significant stenosis. LEFT VERTEBRAL ARTERY:  Antegrade in nature. IMPRESSION: Minimal atherosclerotic plaque bilaterally. No focal hemodynamically significant carotid stenosis is seen. Electronically Signed   By: Inez Catalina M.D.   On: 09/27/2020 20:15   DG Chest Port 1 View  Result Date: 09/27/2020 CLINICAL DATA:  Possible stroke EXAM: PORTABLE CHEST 1 VIEW COMPARISON:  08/27/2020 FINDINGS: Mildly diminished lung volumes. Borderline to mild cardiomegaly. No focal opacity, pleural effusion or pneumothorax IMPRESSION: No active disease. Borderline to mild cardiomegaly. Electronically Signed   By: Donavan Foil M.D.   On: 09/27/2020 19:21   ECHOCARDIOGRAM COMPLETE BUBBLE STUDY  Result Date: 09/28/2020    ECHOCARDIOGRAM REPORT   Patient Name:   LAKETRA BOWDISH Date of Exam: 09/28/2020 Medical Rec #:  [id number]       Height:       63.0 in Accession #:    [id number]      Weight:       171.0 lb Date of Birth:  Mar 02, 1956       BSA:          1.809 m Patient Age:    65 years        BP:           154/74 mmHg Patient Gender: F               HR:           85 bpm. Exam Location:  [facility] Procedure: 2D Echo, Color Doppler, Cardiac Doppler and Saline Contrast  Bubble            Study Indications:     I63.9 Stroke  History:         Patient has prior history of Echocardiogram examinations, most                  recent 07/18/2020. TIA; Risk Factors:Hypertension  and Diabetes.                  Pt tested positive for COVID-19 on 09/27/2020.  Sonographer:     Charmayne Sheer RDCS (AE) Referring Phys:  [id number] [name] N [facility] Diagnosing Phys: Kate Sable MD  Sonographer Comments: Suboptimal apical window and no subcostal window. Image acquisition challenging due to patient body habitus and Image acquisition challenging due to respiratory motion. IMPRESSIONS  1. Left ventricular ejection fraction, by estimation, is 55 to 60%. The left ventricle has normal function. The left ventricle has no regional wall motion abnormalities. Left ventricular diastolic parameters are consistent with Grade I diastolic dysfunction (impaired relaxation).  2. Right ventricular systolic function is normal. The right ventricular size is normal.  3. The mitral valve is normal in structure. No evidence of mitral valve regurgitation.  4. The aortic valve is grossly normal. Aortic valve regurgitation is not visualized. FINDINGS  Left Ventricle: Left ventricular ejection fraction, by estimation, is 55 to 60%. The left ventricle has normal function. The left ventricle has no regional wall motion abnormalities. The left ventricular internal cavity size was normal in size. There is  no left ventricular hypertrophy. Left ventricular diastolic parameters are consistent with Grade I diastolic dysfunction (impaired relaxation). Right Ventricle: The right ventricular size is normal. No increase in right ventricular wall thickness. Right ventricular systolic function is normal. Left Atrium: Left atrial size was normal in size. Right Atrium: Right atrial size was normal in size. Pericardium: There is no evidence of pericardial effusion. Mitral Valve: The mitral valve is normal in structure. No evidence of mitral valve regurgitation. MV peak gradient, 4.2 mmHg. The mean mitral valve gradient is 2.0 mmHg. Tricuspid Valve: The tricuspid valve is grossly normal. Tricuspid valve regurgitation is not demonstrated. Aortic Valve:  The aortic valve is grossly normal. Aortic valve regurgitation is not visualized. Aortic valve mean gradient measures 1.0 mmHg. Aortic valve peak gradient measures 2.8 mmHg. Aortic valve area, by VTI measures 4.68 cm. Pulmonic Valve: The pulmonic valve was not well visualized. Pulmonic valve regurgitation is not visualized. Aorta: The aortic root is normal in size and structure. IAS/Shunts: No atrial level shunt detected by color flow Doppler. Agitated saline contrast was given intravenously to evaluate for intracardiac shunting.  LEFT VENTRICLE PLAX 2D LVIDd:         4.20 cm  Diastology LVIDs:         3.10 cm  LV e' medial:    6.85 cm/s LV PW:         1.30 cm  LV E/e' medial:  6.1 LV IVS:        0.80 cm  LV e' lateral:   5.00 cm/s LVOT diam:     2.40 cm  LV E/e' lateral: 8.3 LV SV:         66 LV SV Index:   37 LVOT Area:     4.52 cm  LEFT ATRIUM           Index LA diam:      2.80 cm 1.55 cm/m LA Vol (A4C): 61.9 ml 34.22 ml/m  AORTIC VALVE  PULMONIC VALVE AV Area (Vmax):    3.78 cm    PV Vmax:       1.05 m/s AV Area (Vmean):   4.07 cm    PV Vmean:      70.200 cm/s AV Area (VTI):     4.68 cm    PV VTI:        0.171 m AV Vmax:           84.30 cm/s  PV Peak grad:  4.4 mmHg AV Vmean:          55.400 cm/s PV Mean grad:  2.0 mmHg AV VTI:            0.141 m AV Peak Grad:      2.8 mmHg AV Mean Grad:      1.0 mmHg LVOT Vmax:         70.50 cm/s LVOT Vmean:        49.800 cm/s LVOT VTI:          0.146 m LVOT/AV VTI ratio: 1.04  AORTA Ao Root diam: 3.60 cm MITRAL VALVE MV Area (PHT): 8.82 cm    SHUNTS MV Area VTI:   5.24 cm    Systemic VTI:  0.15 m MV Peak grad:  4.2 mmHg    Systemic Diam: 2.40 cm MV Mean grad:  2.0 mmHg MV Vmax:       1.02 m/s MV Vmean:      61.5 cm/s MV Decel Time: 86 msec MV E velocity: 41.60 cm/s MV A velocity: 86.50 cm/s MV E/A ratio:  0.48 Kate Sable MD Electronically signed by Kate Sable MD Signature Date/Time: 09/28/2020/3:55:50 PM    Final      Scheduled  Meds: .  stroke: mapping our early stages of recovery book   Does not apply Once  . atorvastatin  40 mg Oral QHS  . cyclobenzaprine  10 mg Oral TID  . insulin aspart  0-15 Units Subcutaneous TID WC  . insulin aspart  0-5 Units Subcutaneous QHS  . insulin detemir  30 Units Subcutaneous Q2200  . metoprolol tartrate  12.5 mg Oral BID   Continuous Infusions: . sodium chloride 100 mL/hr at 09/28/20 1738     LOS: 0 days     Enzo Bi, MD [facility] If 7PM-7AM, please contact night-coverage 09/28/2020, 5:50 PM

## 2020-09-28 NOTE — ED Notes (Signed)
Given lunch and sat up straight in bed.

## 2020-09-28 NOTE — Evaluation (Signed)
Physical Therapy Evaluation Patient Details Name: SIMRA FIEBIG MRN: [id number] DOB: [birth year] Today's Date: 09/28/2020   History of Present Illness  presented to ER secondary to difficulty speaking, walking (with worsening L-sided weakness, numbness) x3 days; admited for TIA/CVA work up.  MRI negative for acute change.  Also noted to by COVID + upon testing.  Clinical Impression  Upon evaluation, patient alert and oriented; follows commands, pleasant and cooperative throughout session.  Baseline expressive aphasia (stuttering) noted with communication efforts; however, able to make wants/needs known with increased time.  Mild L UE/LE weakness noted during isolated testing (patient does feel it worse that baseline) with some degree of give-way weakness; generally inconsistent with functional strength assessment.  Currently able to complete bed mobility with sup/mod indep; sit/stand, basic transfers and short-distance gait (5') with RW, cga/min assist.  Demonstrates 3-point, step to gait pattern; generally cautious and guarded, limited balance reactions evident.  Supports body weight in L LE without difficulty, no buckling or overt LOB.  Does endorse significant dizziness with gait efforts (does not accommodate to position) with noted drop in BP; additional distance deferred as result. Orthostatic assessment completed; see vitals flowsheet for details.  RN/MD informed/aware of BP response to activity. Would benefit from skilled PT to address above deficits and promote optimal return to PLOF.; recommend transition to STR upon discharge from acute hospitalization.     Follow Up Recommendations Home health PT    Equipment Recommendations   (has necessary equip)    Recommendations for Other Services       Precautions / Restrictions Precautions Precautions: Fall Restrictions Weight Bearing Restrictions: No      Mobility  Bed Mobility Overal bed mobility: Needs Assistance Bed Mobility:  Supine to Sit;Sit to Supine     Supine to sit: Supervision Sit to supine: Supervision   General bed mobility comments: mod use of momentum to elevate bilat LEs over edge of bed with transition back to bed    Transfers Overall transfer level: Needs assistance Equipment used: Rolling walker (2 wheeled) Transfers: Sit to/from Stand Sit to Stand: Min assist         General transfer comment: tends to pull on RW despite cuing; generally unsteady.  Limited active use of L LE during movement transition; however, supports weight with standing, stepping without buckling or LOB  Ambulation/Gait Ambulation/Gait assistance: Min guard;Min assist Gait Distance (Feet):  (5') Assistive device: Rolling walker (2 wheeled)       General Gait Details: 3-point, step to gait pattern; generally cautious and guarded, limited balance reactions evident.  Supports body weight in L LE without difficulty, no buckling or overt LOB.  Does endorse significant dizziness with gait efforts (does not accommodate to position) with noted drop in BP; additional distance deferred as result.  Stairs            Wheelchair Mobility    Modified Rankin (Stroke Patients Only)       Balance Overall balance assessment: Needs assistance Sitting-balance support: No upper extremity supported;Feet supported Sitting balance-Leahy Scale: Good     Standing balance support: Bilateral upper extremity supported Standing balance-Leahy Scale: Fair Standing balance comment: limited functional reach, limited balance reactions.  Does require UE support to maintain balance, stabilize and extend reach with standing activities                             Pertinent Vitals/Pain Pain Assessment: Faces Faces Pain Scale: Hurts a little  bit Pain Location: L UE/LE Pain Descriptors / Indicators: Discomfort Pain Intervention(s): Limited activity within patient's tolerance;Monitored during session;Repositioned    Home  Living Family/patient expects to be discharged to:: Private residence Living Arrangements: Spouse/significant other Available Help at Discharge: Family;Available 24 hours/day;Home health;Available PRN/intermittently Type of Home: Mobile home Home Access: Ramped entrance     Home Layout: One level Home Equipment: Oto - 2 wheels;Bedside commode;Shower seat;Wheelchair - manual      Prior Function Level of Independence: Needs assistance   Gait / Transfers Assistance Needed: Pt reports using a RW for functional mobility within the home. Pt was given a wheelchair following last hospital admission but states that she has not used it since initial arrival home. Pt endorses 30 falls within the past 6 months, with pt stating that she is often dizzy before the fall           Hand Dominance        Extremity/Trunk Assessment   Upper Extremity Assessment Upper Extremity Assessment:  (grossly 4-/5 throughout; mild give-way weakness noted L UE)    Lower Extremity Assessment Lower Extremity Assessment:  (grossly 4-/5 throughout; mild give-way weakness noted L LE.  Does endorse generalized paresthesia mid-thigh distally; able to detect light touch, but muted compared to R LE)       Communication      Cognition Arousal/Alertness: Awake/alert Behavior During Therapy: WFL for tasks assessed/performed Overall Cognitive Status: Within Functional Limits for tasks assessed                                        General Comments      Exercises Other Exercises Other Exercises: Sit/stand with RW x2, min assist; limited carry-over of cuing for hand placement; inconsistent use of L LE with movement transitions Other Exercises: Orthostatic assessment-see vitals flowsheet for details; significant drop in BP with transition to upright, significant dizziness reported.  Barrier to further gait/activity progression at this time.   Assessment/Plan    PT Assessment Patient needs  continued PT services  PT Problem List Decreased strength;Decreased activity tolerance;Decreased balance;Decreased mobility;Decreased knowledge of use of DME;Decreased safety awareness;Decreased knowledge of precautions       PT Treatment Interventions DME instruction;Gait training;Stair training;Functional mobility training;Therapeutic activities;Therapeutic exercise;Balance training;Patient/family education    PT Goals (Current goals can be found in the Care Plan section)  Acute Rehab PT Goals Patient Stated Goal: to go home PT Goal Formulation: With patient Time For Goal Achievement: 10/12/20 Potential to Achieve Goals: Fair    Frequency Min 2X/week   Barriers to discharge        Co-evaluation   Reason for Co-Treatment: For patient/therapist safety;To address functional/ADL transfers PT goals addressed during session: Mobility/safety with mobility OT goals addressed during session: ADL's and self-care       AM-PAC PT "6 Clicks" Mobility  Outcome Measure Help needed turning from your back to your side while in a flat bed without using bedrails?: None Help needed moving from lying on your back to sitting on the side of a flat bed without using bedrails?: None Help needed moving to and from a bed to a chair (including a wheelchair)?: A Little Help needed standing up from a chair using your arms (e.g., wheelchair or bedside chair)?: A Little Help needed to walk in hospital room?: A Little Help needed climbing 3-5 steps with a railing? : A Little 6 Click Score:  20    End of Session Equipment Utilized During Treatment: Gait belt Activity Tolerance: Patient tolerated treatment well Patient left: in bed;with call bell/phone within reach Nurse Communication: Mobility status PT Visit Diagnosis: Muscle weakness (generalized) (M62.81);Difficulty in walking, not elsewhere classified (R26.2)    Time: 5259-1028 PT Time Calculation (min) (ACUTE ONLY): 40 min   Charges:   PT  Evaluation $PT Eval Moderate Complexity: 1 Mod PT Treatments $Therapeutic Activity: 8-22 mins        Aneeka Bowden H. Owens Shark, PT, DPT, NCS 09/28/20, 4:54 PM [phone]

## 2020-09-28 NOTE — Progress Notes (Signed)
Inpatient Diabetes Program Recommendations  AACE/ADA: New Consensus Statement on Inpatient Glycemic Control (2015)  Target Ranges:  Prepandial:   less than 140 mg/dL      Peak postprandial:   less than 180 mg/dL (1-2 hours)      Critically ill patients:  140 - 180 mg/dL   Lab Results  Component Value Date   GLUCAP 208 (H) 09/28/2020   HGBA1C 10.0 (H) 08/28/2020    Review of Glycemic Control Results for VIOLA, KINNICK (MRN [id number]) as of 09/28/2020 11:05  Ref. Range 09/27/2020 22:23 09/28/2020 07:55  Glucose-Capillary Latest Ref Range: 70 - 99 mg/dL 260 (H) 208 (H)   Diabetes history: Type 2 Dm Outpatient Diabetes medications: Levemir 30 units BID Current orders for Inpatient glycemic control: Levemir 30 units QHS, Novolog 0-5 units QHS, Novolog 0-15 units TID  Inpatient Diabetes Program Recommendations:    Consider increasing Levemir to 20 units BID and adding Tradjenta 5 mg QD.  Attempted to reach patient will reattempt during hospitalization.   Thanks, Bronson Curb, MSN, RNC-OB Diabetes Coordinator [phone] (8a-5p)

## 2020-09-28 NOTE — Evaluation (Addendum)
Clinical/Bedside Swallow Evaluation Patient Details  Name: [name] MRN: [id number] Date of Birth: 08-07-1956  Today's Date: 09/28/2020 Time: SLP Start Time (ACUTE ONLY): 0840 SLP Stop Time (ACUTE ONLY): 0940 SLP Time Calculation (min) (ACUTE ONLY): 60 min  Past Medical History:  Past Medical History:  Diagnosis Date  . Acute encephalopathy 05/22/2018  . Allergy   . Anginal pain ([place])   . Anxiety   . Ascites   . C. difficile colitis 07/10/2015  . Cancer (HCC)    HX OF CANCER OF UTERUS   . Cirrhosis of liver not due to alcohol ([place]) 2016  . Degenerative disk disease   . Diverticulitis   . Gastroparesis   . GERD (gastroesophageal reflux disease)   . History of hiatal hernia   . Hypertension   . Hypothyroid   . Hypothyroidism due to amiodarone   . Ileus ([place]) 08/01/2015  . Intussusception intestine ([place]) 05/2015  . Orthostatic hypotension   . PAF (paroxysmal atrial fibrillation) ([place]) 03/2015   a. new onset 03/2015 in setting of intractable N/V; b. on Eliquis 5 mg bid; c. CHADSVASc 4 (DM, TIA x 2, female)  . Pancreatitis   . Pneumonia 11/14/2015  . Pneumonia 07/2020  . Right ureteral stone 07/14/2016  . Sepsis ([place]) 12/15/2017  . Sick sinus syndrome ([place])   . Stomach ulcer   . Stroke [facility])    with minimal left sided weakness  . Syncope 01/2015  . Syncope due to orthostatic hypotension 05/18/2015  . Tachyarrhythmia 01/10/2016  . TIA (transient ischemic attack) 02/2015  . Type 1 diabetes ([place])    on levemir  . UTERINE CANCER, HX OF 03/27/2007   Qualifier: Diagnosis of  By: Maxie Better FNP, Rosalita Levan   . UTI (urinary tract infection) 05/22/2018   Past Surgical History:  Past Surgical History:  Procedure Laterality Date  . ABDOMINAL HYSTERECTOMY    . CARDIAC CATHETERIZATION N/A 01/12/2016   Procedure: Left Heart Cath and Coronary Angiography;  Surgeon: Wellington Hampshire, MD;  Location: [place] [facility];  Service: Cardiovascular;  Laterality: N/A;  . CHOLECYSTECTOMY    .  CYSTOSCOPY W/ RETROGRADES Bilateral 12/11/2019   Procedure: CYSTOSCOPY WITH RETROGRADE PYELOGRAM;  Surgeon: Billey Co, MD;  Location: [facility];  Service: Urology;  Laterality: Bilateral;  . CYSTOSCOPY WITH BIOPSY N/A 12/11/2019   Procedure: CYSTOSCOPY WITH BLADDER BIOPSY;  Surgeon: Billey Co, MD;  Location: [facility];  Service: Urology;  Laterality: N/A;  . CYSTOSCOPY WITH FULGERATION N/A 12/11/2019   Procedure: CYSTOSCOPY WITH FULGERATION;  Surgeon: Billey Co, MD;  Location: [facility];  Service: Urology;  Laterality: N/A;  . CYSTOSCOPY WITH STENT PLACEMENT Right 12/11/2019   Procedure: CYSTOSCOPY WITH STENT PLACEMENT;  Surgeon: Billey Co, MD;  Location: [facility];  Service: Urology;  Laterality: Right;  . CYSTOSCOPY WITH URETHRAL DILATATION Right 12/11/2019   Procedure: CYSTOSCOPY WITH URETERAL DILATATION;  Surgeon: Billey Co, MD;  Location: [facility];  Service: Urology;  Laterality: Right;  . CYSTOSCOPY/URETEROSCOPY/HOLMIUM LASER Right 07/14/2016   Procedure: CYSTOSCOPY/URETEROSCOPY/HOLMIUM LASER;  Surgeon: Alexis Frock, MD;  Location: [facility];  Service: Urology;  Laterality: Right;  . ESOPHAGOGASTRODUODENOSCOPY N/A 04/04/2015   Procedure: ESOPHAGOGASTRODUODENOSCOPY (EGD);  Surgeon: Hulen Luster, MD;  Location: [facility] ENDOSCOPY;  Service: Endoscopy;  Laterality: N/A;  . ESOPHAGOGASTRODUODENOSCOPY N/A 12/28/2017   Procedure: ESOPHAGOGASTRODUODENOSCOPY (EGD);  Surgeon: Lin Landsman, MD;  Location: [facility] ENDOSCOPY;  Service: Gastroenterology;  Laterality: N/A;  . ESOPHAGOGASTRODUODENOSCOPY (EGD) WITH PROPOFOL N/A 01/18/2016  Procedure: ESOPHAGOGASTRODUODENOSCOPY (EGD) WITH PROPOFOL;  Surgeon: Lucilla Lame, MD;  Location: [facility] ENDOSCOPY;  Service: Endoscopy;  Laterality: N/A;  . FLEXIBLE SIGMOIDOSCOPY N/A 01/18/2016   Procedure: FLEXIBLE SIGMOIDOSCOPY;  Surgeon: Lucilla Lame, MD;  Location: [facility] ENDOSCOPY;  Service: Endoscopy;  Laterality: N/A;  . HERNIA REPAIR    . URETEROSCOPY  Right 12/11/2019   Procedure: Christen Butter;  Surgeon: Billey Co, MD;  Location: [facility] ORS;  Service: Urology;  Laterality: Right;   HPI:  Pt is a 65 y.o. female with medical history significant for history of afib not on anticoagulation, Anxiety, frequent and multiple falls in the last 3-4 years, hypertension, fibromyalgia, CVA, Obesity, presented to the emergency department for chief concerns of strokelike symptoms. She endorsed difficulty speaking and walking for 3 days. She endorsed numbness in the left side for 5 days.  She states that she did not present to the emergency department sooner because she had a cardiology appointment 09/27/2020.  She also endorsed new shortness of breath and cough. She fell three times prior to presentation and endorsed head trauma and endorsed lost of consicousness. She states she was out for several minutes and woke up in the kitchen. Her spouse heard her fall and called EMS.  Pt has Significan medical history w/ Multiple medical dxs including GERD and previous change in Cognitive status and Dysfluency of speech in 2017: "pt exhibits stuttering, which she reports started at some point after her fall at home"; unable to complete the [facility] at that assessment. In 2019, pt was screened by ST services then d/t no complaints of speech-language or swallowing.  MRI: No acute intracranial infarct or other abnormality. 2. Age-related cerebral atrophy with mild-to-moderate chronic small  vessel ischemic disease, with a few scattered small remote ischemic  infarcts.  CXR: No active disease. Borderline to mild cardiomegaly.  Covid Positive.   Assessment / Plan / Recommendation Clinical Impression  Pt seen today for BSE d/t failing the [facility] screen w/ NSG - she stopped when drinking. Currently, pt exhibits min anxiousnees, shaky UEs and Dysfluency of speech; min distracted requiring min verbal cues for follow through. She needed full support for positioning upright and  supporting self-feeding. Pt appears to present w/ grossly adequate oropharyngeal phase swallow w/ No oropharyngeal phase dysphagia noted w/ trials of liquids and purees, No neuromuscular deficits noted. Pt consumed po trials w/ No overt, clinical s/s of aspiration during po trials. However, pt exhibited oral awareness/phase deficits w/ trials of increased textured foods expectorating the 1 attempted trial w/ increased gagging behavior noted. Pt appears at reduced risk for aspiration following general aspiration precautions w/ a modified diet consistency. With liquid and food consistency she was able to accept, no overt s/s of aspiration noted; no decline in respiratory status and clear vocal quality was noted b/t trials. O2 sats remained in the mid+90s. Intermiittent, nild-mod [place] noted -- baseline GERD. Oral phase was adequate for bolus control and A-P transfer for swallowing. Pt is Edentulous status. W/ the trial of mech soft/solid food moistened in applesauce, pt exhibited decreased awareness of the bolus w/ poorly coordinated manipulation w/ Mod gagging behavior occurring immediately; bolus was expectorated. Pt stated she couldn't try more at the time. OM exam revealed no unilateral weakness; speech clear but Dysfluent. Recommend a Dysphagia level 1 (puree) diet w/ thin liquids; aspiration precautions; feeding support d/t UE weakness. Pills in Puree for safer swallowing. Reflux precautions d/t baseline GERD. Rest Breaks during meals for digestion and conservation of energy. Reduce distractions during  meals.  Of note, regarding pt's Dysfluency of speech, recommend f/u w/ Neurology to r/o worsening stroke symptoms vs other consideration to include Conversion dis. in light of stressors in pt's life and her baseline Anxiety. MD made aware. SLP Visit Diagnosis: Dysphagia, oral phase (R13.11)    Aspiration Risk  Mild aspiration risk;Risk for inadequate nutrition/hydration (reduced following precautions)     Diet Recommendation  Pureed diet (Dysphagia level 1) w/ thin liquids. General aspiration precautions; REFLUX/GERD precautions baseline. Feeding support at meals as needed d/t weakness currently. Reduce distractions.   Medication Administration: Whole meds with puree (for safer swallowing vs need to crush)    Other  Recommendations Recommended Consults:  (Dietician f/u as needed) Oral Care Recommendations: Oral care BID;Staff/trained caregiver to provide oral care Other Recommendations:  (n/a)   Follow up Recommendations None      Frequency and Duration min 2x/week  2 weeks       Prognosis Prognosis for Safe Diet Advancement: Fair Barriers to Reach Goals: Cognitive deficits;Language deficits;Time post onset;Severity of deficits;Behavior      Swallow Study   General Date of Onset: 09/27/20 HPI: Pt is a 65 y.o. female with medical history significant for history of afib not on anticoagulation, Anxiety, frequent and multiple falls in the last 3-4 years, hypertension, fibromyalgia, CVA, Obesity, presented to the emergency department for chief concerns of strokelike symptoms. She endorsed difficulty speaking and walking for 3 days. She endorsed numbness in the left side for 5 days.  She states that she did not present to the emergency department sooner because she had a cardiology appointment 09/27/2020.  She also endorsed new shortness of breath and cough. She fell three times prior to presentation and endorsed head trauma and endorsed lost of consicousness. She states she was out for several minutes and woke up in the kitchen. Her spouse heard her fall and called EMS.  Pt has Significan medical history w/ Multiple medical dxs including GERD and previous change in Cognitive status and Dysfluency of speech in 2017: "pt exhibits stuttering, which she reports started at some point after her fall at home"; unable to complete the [facility] at that assessment. In 2019, pt was screened by ST services then d/t  no complaints of speech-language or swallowing.  MRI: No acute intracranial infarct or other abnormality. 2. Age-related cerebral atrophy with mild-to-moderate chronic small  vessel ischemic disease, with a few scattered small remote ischemic  infarcts.  CXR: No active disease. Borderline to mild cardiomegaly. Type of Study: Bedside Swallow Evaluation Previous Swallow Assessment: 2017 - mech soft diet then Diet Prior to this Study: Regular;Thin liquids (per pt) Temperature Spikes Noted: No (wbc 5.5) Respiratory Status: Room air History of Recent Intubation: No Behavior/Cognition: Alert;Cooperative;Pleasant mood;Distractible;Requires cueing (dysfluency) Oral Cavity Assessment: Within Functional Limits Oral Care Completed by SLP: Yes Oral Cavity - Dentition: Edentulous Vision: Functional for self-feeding Self-Feeding Abilities: Needs assist;Needs set up;Total assist Patient Positioning: Upright in bed (needed positioning) Baseline Vocal Quality: Normal (dysfluency) Volitional Cough: Strong Volitional Swallow: Able to elicit    Oral/Motor/Sensory Function Overall Oral Motor/Sensory Function: Within functional limits   Ice Chips Ice chips: Within functional limits Presentation: Spoon (3 trials)   Thin Liquid Thin Liquid: Within functional limits (grossly) Presentation: Cup;Self Fed (~3 ozs total)    Nectar Thick Nectar Thick Liquid: Not tested   Honey Thick Honey Thick Liquid: Not tested   Puree Puree: Within functional limits (grossly) Presentation: Spoon (fed; 8 trials)   Solid     Solid: Impaired  Presentation: Spoon (fed; 1 trial) Oral Phase Impairments: Poor awareness of bolus Oral Phase Functional Implications: Prolonged oral transit Pharyngeal Phase Impairments:  (expectorated d/t gagging behavior)        Orinda Kenner, MS, CCC-SLP Speech Language Pathologist Rehab Services [phone] Elmina Hendel 09/28/2020,3:58 PM

## 2020-09-28 NOTE — Progress Notes (Signed)
*  PRELIMINARY RESULTS* Echocardiogram 2D Echocardiogram has been performed.  [name] 09/28/2020, 9:33 AM

## 2020-09-28 NOTE — Consult Note (Signed)
NEUROLOGY CONSULTATION NOTE   Date of service: September 28, 2020 Patient Name: [name] MRN:  [id number] DOB:  Jan 15, 1956 Reason for consult: "Left sided weakness" _ _ _   _ __   _ __ _ _  __ __   _ __   __ _  History of Present Illness  [name] is a 65 y.o. female with PMH significant for afibb not on AC due to frequent falls, HTN, obesity, cirrhosis, GERD, pancreatitis, prior stroke including a lacunar pontine infarct, who presents with dizziness which she describes as lightheadedness, difficulty walking and stuttering for 5 days. Initialy were intermittent, then persistent x 3 days. She also reports numbness and weakness on the left back in December 2021 but it resolved, got worse over the last few days. Endorses that she has been falling about twice a week. She therefore is not on stronger blood thinners.  Endorses significant stressors in life including financial and family issues that has been very emotionally overwhelming. Lives is a home that she feels is not as clean as she wants it to be and with all her health issues, is difficult to keep clean.  In the ED, she had workup with MRI Brain which was negative for acute strokes, MR Angio head and Carotid duplex was negative for LVO or any significant stenosis.   ROS   A detailed ROS was obtained and was negative except above.  Past History   Past Medical History:  Diagnosis Date  . Acute encephalopathy 05/22/2018  . Allergy   . Anginal pain ([place])   . Anxiety   . Ascites   . C. difficile colitis 07/10/2015  . Cancer (HCC)    HX OF CANCER OF UTERUS   . Cirrhosis of liver not due to alcohol ([place]) 2016  . Degenerative disk disease   . Diverticulitis   . Gastroparesis   . GERD (gastroesophageal reflux disease)   . History of hiatal hernia   . Hypertension   . Hypothyroid   . Hypothyroidism due to amiodarone   . Ileus ([place]) 08/01/2015  . Intussusception intestine ([place]) 05/2015  . Orthostatic hypotension   . PAF  (paroxysmal atrial fibrillation) ([place]) 03/2015   a. new onset 03/2015 in setting of intractable N/V; b. on Eliquis 5 mg bid; c. CHADSVASc 4 (DM, TIA x 2, female)  . Pancreatitis   . Pneumonia 11/14/2015  . Pneumonia 07/2020  . Right ureteral stone 07/14/2016  . Sepsis ([place]) 12/15/2017  . Sick sinus syndrome ([place])   . Stomach ulcer   . Stroke [facility])    with minimal left sided weakness  . Syncope 01/2015  . Syncope due to orthostatic hypotension 05/18/2015  . Tachyarrhythmia 01/10/2016  . TIA (transient ischemic attack) 02/2015  . Type 1 diabetes ([place])    on levemir  . UTERINE CANCER, HX OF 03/27/2007   Qualifier: Diagnosis of  By: Maxie Better FNP, Rosalita Levan   . UTI (urinary tract infection) 05/22/2018   Past Surgical History:  Procedure Laterality Date  . ABDOMINAL HYSTERECTOMY    . CARDIAC CATHETERIZATION N/A 01/12/2016   Procedure: Left Heart Cath and Coronary Angiography;  Surgeon: Wellington Hampshire, MD;  Location: [facility];  Service: Cardiovascular;  Laterality: N/A;  . CHOLECYSTECTOMY    . CYSTOSCOPY W/ RETROGRADES Bilateral 12/11/2019   Procedure: CYSTOSCOPY WITH RETROGRADE PYELOGRAM;  Surgeon: Billey Co, MD;  Location: [facility];  Service: Urology;  Laterality: Bilateral;  . CYSTOSCOPY WITH BIOPSY N/A 12/11/2019  Procedure: CYSTOSCOPY WITH BLADDER BIOPSY;  Surgeon: Billey Co, MD;  Location: [facility];  Service: Urology;  Laterality: N/A;  . CYSTOSCOPY WITH FULGERATION N/A 12/11/2019   Procedure: CYSTOSCOPY WITH FULGERATION;  Surgeon: Billey Co, MD;  Location: [facility];  Service: Urology;  Laterality: N/A;  . CYSTOSCOPY WITH STENT PLACEMENT Right 12/11/2019   Procedure: CYSTOSCOPY WITH STENT PLACEMENT;  Surgeon: Billey Co, MD;  Location: [facility];  Service: Urology;  Laterality: Right;  . CYSTOSCOPY WITH URETHRAL DILATATION Right 12/11/2019   Procedure: CYSTOSCOPY WITH URETERAL DILATATION;  Surgeon: Billey Co, MD;  Location: [facility];  Service: Urology;   Laterality: Right;  . CYSTOSCOPY/URETEROSCOPY/HOLMIUM LASER Right 07/14/2016   Procedure: CYSTOSCOPY/URETEROSCOPY/HOLMIUM LASER;  Surgeon: Alexis Frock, MD;  Location: [facility];  Service: Urology;  Laterality: Right;  . ESOPHAGOGASTRODUODENOSCOPY N/A 04/04/2015   Procedure: ESOPHAGOGASTRODUODENOSCOPY (EGD);  Surgeon: Hulen Luster, MD;  Location: [facility] ENDOSCOPY;  Service: Endoscopy;  Laterality: N/A;  . ESOPHAGOGASTRODUODENOSCOPY N/A 12/28/2017   Procedure: ESOPHAGOGASTRODUODENOSCOPY (EGD);  Surgeon: Lin Landsman, MD;  Location: [facility] ENDOSCOPY;  Service: Gastroenterology;  Laterality: N/A;  . ESOPHAGOGASTRODUODENOSCOPY (EGD) WITH PROPOFOL N/A 01/18/2016   Procedure: ESOPHAGOGASTRODUODENOSCOPY (EGD) WITH PROPOFOL;  Surgeon: Lucilla Lame, MD;  Location: [facility] ENDOSCOPY;  Service: Endoscopy;  Laterality: N/A;  . FLEXIBLE SIGMOIDOSCOPY N/A 01/18/2016   Procedure: FLEXIBLE SIGMOIDOSCOPY;  Surgeon: Lucilla Lame, MD;  Location: [facility] ENDOSCOPY;  Service: Endoscopy;  Laterality: N/A;  . HERNIA REPAIR    . URETEROSCOPY Right 12/11/2019   Procedure: Christen Butter;  Surgeon: Billey Co, MD;  Location: [facility];  Service: Urology;  Laterality: Right;   Family History  Problem Relation Age of Onset  . Hypertension Mother   . CAD Sister   . Heart attack Sister        Deceased 2014-11-22  . CAD Brother    Social History   Socioeconomic History  . Marital status: Married    Spouse name: [name]   . Number of children: Not on file  . Years of education: Not on file  . Highest education level: Not on file  Occupational History  . Occupation: Disabled 2nd back problems  Tobacco Use  . Smoking status: Former Smoker    Types: Cigarettes  . Smokeless tobacco: Never Used  . Tobacco comment: 25 years ago and only smoked occasionally  Vaping Use  . Vaping Use: Never used  Substance and Sexual Activity  . Alcohol use: No  . Drug use: No  . Sexual activity: Not Currently  Other Topics  Concern  . Not on file  Social History Narrative   Lives in [place], [place] with her husband and 2 sons.   Social Determinants of Health   Financial Resource Strain: Not on file  Food Insecurity: Not on file  Transportation Needs: Not on file  Physical Activity: Not on file  Stress: Not on file  Social Connections: Not on file   Allergies  Allergen Reactions  . Aspirin Rash  . Codeine Sulfate Rash  . Erythromycin Rash    Reaction:  Fever   . Prednisone Swelling  . Rosiglitazone Maleate Swelling  . Tetanus-Diphtheria Toxoids Td Rash and Other (See Comments)    Reaction:  Fever     Medications  (Not in a hospital admission)    Vitals   Vitals:   09/28/20 0400 09/28/20 0600 09/28/20 0645 09/28/20 0700  BP: (!) 149/76   (!) 154/74  Pulse: 87   89  Resp: 17 18 16  16  Temp:      TempSrc:      SpO2: 96% 96% 100% 100%  Weight:      Height:         Body mass index is 30.29 kg/m.  Physical Exam   General: Laying comfortably in bed; in no acute distress. HENT: Normal oropharynx and mucosa. Normal external appearance of ears and nose. Neck: Supple, no pain or tenderness CV: No JVD. No peripheral edema. Pulmonary: Symmetric Chest rise. Normal respiratory effort. Abdomen: Soft to touch, non-tender. Ext: No cyanosis, edema, or deformity Skin: No rash. Normal palpation of skin.  Musculoskeletal: Normal digits and nails by inspection. No clubbing.  Neurologic Examination  Mental status/Cognition: Alert, oriented to self, place, month and year, good attention. Speech/language: Fluent,stuttering speech, improves when engaged in conversation, comprehension intact, object naming intact, repetition intact. Cranial nerves:   CN II Pupils equal and reactive to light, endorses tunneling of vision that is long stading per patient.   CN III,IV,VI EOM intact, no gaze preference or deviation, no nystagmus   CN V normal sensation in V1, V2, and V3 segments bilaterally   CN VII no  asymmetry, no nasolabial fold flattening   CN VIII normal hearing to speech   CN IX & X normal palatal elevation, no uvular deviation   CN XI 5/5 head turn and 5/5 shoulder shrug bilaterally   CN XII midline tongue protrusion    Motor:  Muscle bulk: poor, tone normal, pronator drift none tremor none Mvmt Root Nerve  Muscle Right Left Comments  SA C5/6 Ax Deltoid 5 4+ Fluctuating weakness on the left with giveaway component.  EF C5/6 Mc Biceps 5 4+   EE C6/7/8 Rad Triceps 5 4+   WF C6/7 Med FCR     WE C7/8 PIN ECU     F Ab C8/T1 U ADM/FDI 5 4+   HF L1/2/3 Fem Illopsoas 5 4+   KE L2/3/4 Fem Quad     DF L4/5 D Peron Tib Ant 5 4   PF S1/2 Tibial Grc/Sol 5 4    Reflexes:  Right Left Comments  Pectoralis      Biceps (C5/6)     Brachioradialis (C5/6)      Triceps (C6/7)      Patellar (L3/4)      Achilles (S1)      Hoffman      Plantar     Jaw jerk    Sensation:  Light touch Decreased to 30% in L face, LUE and LLE   Pin prick    Temperature    Vibration   Proprioception    Coordination/Complex Motor:  - Finger to Nose intact BL - Heel to shin intact BL - Rapid alternating movement are slowed all over. - Gait: Deferred.  Labs   CBC:  Recent Labs  Lab 09/27/20 1319  WBC 5.5  NEUTROABS 4.4  HGB 10.3*  HCT 33.4*  MCV 83.5  PLT 342    Basic Metabolic Panel:  Lab Results  Component Value Date   NA 136 09/27/2020   K 3.2 (L) 09/27/2020   CO2 17 (L) 09/27/2020   GLUCOSE 380 (H) 09/27/2020   BUN 13 09/27/2020   CREATININE 0.95 09/27/2020   CALCIUM 7.5 (L) 09/27/2020   GFRNONAA >60 09/27/2020   GFRAA 49 (L) 02/20/2020   Lipid Panel:  Lab Results  Component Value Date   LDLCALC 76 09/28/2020   HgbA1c:  Lab Results  Component Value Date   HGBA1C 10.0 (  H) 08/28/2020   Urine Drug Screen:     Component Value Date/Time   LABOPIA NONE DETECTED 01/23/2020 0253   COCAINSCRNUR NONE DETECTED 01/23/2020 0253   COCAINSCRNUR NONE DETECTED 08/13/2019 2346    LABBENZ NONE DETECTED 01/23/2020 0253   AMPHETMU NONE DETECTED 01/23/2020 0253   THCU NONE DETECTED 01/23/2020 0253   LABBARB NONE DETECTED 01/23/2020 0253    Alcohol Level     Component Value Date/Time   ETH <10 05/22/2018 0737    CT Head without contrast: 1. No acute intracranial findings. 2. No evidence of acute fracture or traumatic listhesis of the cervical spine. 3. New right frontal sinus opacification.  Correlate for sinusitis. 4. Chronic nonspecific partial opacification of the left mastoid air cells.  MR angio Head and Carotid duplex: No LVO, no significant stenosis  MRI Brain  No new stroke, does have remote lacunar strokes, most notable in pons.   Impression   [name] is a 65 y.o. female with PMH significant for afibb not on AC due to frequent falls, HTN, obesity, cirrhosis, GERD, pancreatitis, prior stroke including a lacunar pontine infarct, who presents with a constellation of symptoms including lightheadedness, difficulty walking and stuttering for 5 days and left sided weakness x 3 days. Her neurologic examination is notable for a fluctuating left sided weakness that is either due to recrudescence of her prior stroke symptoms in the setting of a stressor, but I do have some concerns for conversion disorder too. She is Covid positive and endorses lethargy. She however does not have any respiratory symptoms. Workup with MRI Brain without contrast with no acute strokes.  I did discuss with her my impression including that this may be either worsening of her prior stroke symptoms or conversion disorder. She is open to discussion about her being stressed out. Does endorse overwhelming stress in life both financial and family issues and is open to having a therapist help her cope with her stressors. I think this may be beneficial for her quality of life.  Of note, orthostatic vitals did demonstrate a significant drop in blood pressure upon standing. This can  contribute to her dizziness and falls.   Recommendations  - No need for further stroke workup, she recently had completed stroke workup - If she presents with new neurological deficit in the future, if within the acute stroke window, please activate the stroke code pathway. If outside the window for any intervention, would recommend getting just an MRI Brain to start with and further workup can be obtained after the MRI. - Agree with doing aspirin 54m daily instead of Anticoagulation given her frequent falls.  Non-Pharmacologic Recs for Orthostatic Hypotension 1) Lifestyle modification. These measures include:  - Arising slowly, in stages, from supine to seated to standing. This maneuver is most important in the morning, when orthostatic tolerance is lowest.  - Avoiding straining, coughing, and walking in hot weather.These activities reduce venous return and worsen orthostatic hypotension.  - Maintaining hydration and avoiding over-heating.  - Raising the head of the bed 30 to 45 degrees decreases renal perfusion, thereby activating the renin-angiotensin-aldosterone system and decreasing nocturnal diuresis, which can be pronounced in these patients.  These changes relieve orthostatic hypotension by expanding extracellular fluid volume and may reduce end organ damage by reducing supine HTN.  2) Exercise - walking 30 minutes a day, exercise in a swimming pool, exercise in a recumbent or seated position (using a stationary bike or rowing machine)  3) Abdominal binders   4)  Compression Stockings  5) Increased salt and water intake to 2 L to 2.5 L of water a day  6) Modification of meals:  - Avoiding large meals  - Ingesting meals low in carbohydrates  - Alcohol should be avoided during the day as it is a vasodilator  - Drink water with meals  - Avoiding activities or sudden standing immediately after eating  7) Physical Countermaneuvers during daily activities: leg crossing, standing on tip  toes, squatting   - Follow up with cardiology outpatient for further management of orthostatic hypotension.  ______________________________________________________________________   Thank you for the opportunity to take part in the care of this patient. If you have any further questions, please contact the neurology consultation attending.  Signed,  [place] Pager Number [phone] _ _ _   _ __   _ __ _ _  __ __   _ __   __ _

## 2020-09-28 NOTE — Evaluation (Addendum)
Occupational Therapy Evaluation Patient Details Name: [name] MRN: [id number] DOB: [birth year] Today's Date: 09/28/2020    History of Present Illness 66 y.o. female with medical history significant for history of afib not on anticoagulation, frequent and multiple falls in the last 3-4 years, hypertension, fibromyalgia, obesity, presented to the emergency department for chief concerns of strokelike symptoms. She endorsed difficulty speaking and walking for 3 days. She endorsed numbness in the left side for 5 days.  She states that she did not present to the emergency department sooner because she had a cardiology appointment 09/27/2020.  She also endorsed new shortness of breath and cough. She fell three times prior to presentation and endorsed head trauma and endorsed lost of consicousness. She states she was out for several minutes and woke up in the kitchen. Her spouse heard her fall and called EMS   Clinical Impression   Pt seen for OT evaluation this date. Pt reports independence with BADLs and functional mobility with RW prior to admission. Pt currently living in a 1-story mobile home with husband, daughter, and grandchild. Pt reports frequent dizziness with change in position and endorsed 30 falls within the past 6 months. This date, pt required MAX A for bed-level LB dressing d/t pt reported fatigue. Pt was able to take 3 forward/backward steps with RW and MIN A. Further mobility deferred in setting of orthostatic hypotension (see below) and worsening dizziness. Pt would benefit from additional skilled OT services to maximize independence with ADLs and functional mobility and to improve safety awareness. Upon discharge, recommend HHOT services and 24/7 supervision.    Follow Up Recommendations  Home health OT;Supervision/Assistance - 24 hour          Precautions / Restrictions Precautions Precautions: Fall Restrictions Weight Bearing Restrictions: No Other Position/Activity  Restrictions: significant symptomatic orthostasis with transition upright      Mobility Bed Mobility Overal bed mobility: Needs Assistance Bed Mobility: Supine to Sit;Sit to Supine     Supine to sit: Supervision;HOB elevated Sit to supine: Supervision;HOB elevated   General bed mobility comments: increased time and effort. Pt endorsing dizziness with change in positioning    Transfers Overall transfer level: Needs assistance Equipment used: Rolling walker (2 wheeled) Transfers: Sit to/from Stand Sit to Stand: Min assist              Balance Overall balance assessment: Needs assistance Sitting-balance support: No upper extremity supported;Feet unsupported Sitting balance-Leahy Scale: Fair Sitting balance - Comments: Sitting EOB during UE assessment, pt with x1 poterior lean able to readjust with UE support. Postural control: Posterior lean   Standing balance-Leahy Scale: Poor Standing balance comment: Taking ~3 steps forward/backwards with RW. Further mobility deferred in setting of orthostatic hypotension and worsening dizzness                           ADL either performed or assessed with clinical judgement   ADL Overall ADL's : Needs assistance/impaired                     Lower Body Dressing: Moderate assistance;Bed level Lower Body Dressing Details (indicate cue type and reason): Pt able to dof socks independently, however required MAX A to don socks at bed-level d/t fatigue             Functional mobility during ADLs: Minimal assistance;Rolling walker General ADL Comments: Pt demonstrating orthostatic hypotension with functional mobility (supine 134/78; sitting 105/68, HR 76; standing  85/57, HR 80; supine end of session 110/71, HR 75). Further mobility deferred in setting of orthostatic hypotension and c/o worsening dizzness. RN and MD aware     Vision Baseline Vision/History: Wears glasses Wears Glasses: At all times Patient Visual  Report: Blurring of vision Additional Comments: Pt endorsing worsening blurriness since admission            Pertinent Vitals/Pain Pain Assessment: Faces Faces Pain Scale: Hurts a little bit Pain Location: L UE/LE Pain Descriptors / Indicators: Discomfort     Hand Dominance Right   Extremity/Trunk Assessment Upper Extremity Assessment Upper Extremity Assessment: Generalized weakness;LUE deficits/detail LUE Deficits / Details: 3/5 during formal MMT testing, however, demonstrated adequate UE strength for basic transfers and mobility LUE Sensation: decreased light touch LUE Coordination: decreased gross motor   Lower Extremity Assessment Lower Extremity Assessment: Defer to PT evaluation   Cervical / Trunk Assessment Cervical / Trunk Assessment: Normal   Communication Communication Communication: HOH (pt reports loss of hearing L ear)   Cognition Arousal/Alertness: Awake/alert Behavior During Therapy: WFL for tasks assessed/performed Overall Cognitive Status: Impaired/Different from baseline Area of Impairment: Safety/judgement;Following commands;Problem solving                       Following Commands: Follows one step commands consistently Safety/Judgement: Decreased awareness of deficits;Decreased awareness of safety   Problem Solving: Slow processing;Decreased initiation;Difficulty sequencing;Requires verbal cues;Requires tactile cues                Home Living Family/patient expects to be discharged to:: Private residence Living Arrangements: Spouse/significant other;Children;Other relatives (49 y.o grandson) Available Help at Discharge: Family;Available 24 hours/day;Home health;Available PRN/intermittently Type of Home: Mobile home Home Access: Ramped entrance     Home Layout: One level     Bathroom Shower/Tub: Tub/shower unit;Curtain   Biochemist, clinical: Standard     Home Equipment: Environmental consultant - 2 wheels;Bedside commode;Shower seat;Wheelchair -  manual   Additional Comments: Per chart review, husband has a disability      Prior Functioning/Environment Level of Independence: Needs assistance  Gait / Transfers Assistance Needed: Pt reports using a RW for functional mobility within the home. Pt was given a wheelchair following last hospital admission but states that she has not used it since initial arrival home. Pt endorses 30 falls within the past 6 months, with pt stating that she is often dizzy before the fall ADL's / Homemaking Assistance Needed: Pt reports being independent with dressing. Pt states that she takes baths to wash up Communication / Swallowing Assistance Needed: Expressive aphasia Comments: Following last admission, pt was recieving home health OT/SW, noting that she has only had each discipline visit once d/t snow impacting visits        OT Problem List: Decreased strength;Decreased activity tolerance;Impaired balance (sitting and/or standing);Decreased safety awareness;Decreased knowledge of precautions;Impaired vision/perception;Impaired sensation      OT Treatment/Interventions: Self-care/ADL training;Therapeutic exercise;Energy conservation;DME and/or AE instruction;Therapeutic activities;Patient/family education;Balance training    OT Goals(Current goals can be found in the care plan section) Acute Rehab OT Goals Patient Stated Goal: to go home OT Goal Formulation: With patient Time For Goal Achievement: 10/12/20 Potential to Achieve Goals: Good  OT Frequency: Min 1X/week   Barriers to D/C: Decreased caregiver support          Co-evaluation PT/OT/SLP Co-Evaluation/Treatment: Yes Reason for Co-Treatment: For patient/therapist safety;To address functional/ADL transfers PT goals addressed during session: Mobility/safety with mobility;Balance OT goals addressed during session: ADL's and self-care  AM-PAC OT "6 Clicks" Daily Activity     Outcome Measure Help from another person eating meals?: A  Little Help from another person taking care of personal grooming?: A Little Help from another person toileting, which includes using toliet, bedpan, or urinal?: A Lot Help from another person bathing (including washing, rinsing, drying)?: A Lot Help from another person to put on and taking off regular upper body clothing?: A Little Help from another person to put on and taking off regular lower body clothing?: A Lot 6 Click Score: 15   End of Session Equipment Utilized During Treatment: Gait belt;Rolling walker Nurse Communication: Mobility status;Precautions  Activity Tolerance: Patient tolerated treatment well Patient left: in bed;with call bell/phone within reach (left with neurology)  OT Visit Diagnosis: Unsteadiness on feet (R26.81);History of falling (Z91.81)                Time: 5188-4166 OT Time Calculation (min): 43 min Charges:  OT General Charges $OT Visit: 1 Visit OT Evaluation $OT Eval Moderate Complexity: 1 Mod OT Treatments $Self Care/Home Management : 8-22 mins  Fredirick Maudlin, OTR/[place]

## 2020-09-29 DIAGNOSIS — I480 Paroxysmal atrial fibrillation: Secondary | ICD-10-CM | POA: Diagnosis not present

## 2020-09-29 DIAGNOSIS — I639 Cerebral infarction, unspecified: Secondary | ICD-10-CM | POA: Diagnosis not present

## 2020-09-29 DIAGNOSIS — N1831 Chronic kidney disease, stage 3a: Secondary | ICD-10-CM

## 2020-09-29 DIAGNOSIS — E1165 Type 2 diabetes mellitus with hyperglycemia: Secondary | ICD-10-CM | POA: Diagnosis not present

## 2020-09-29 DIAGNOSIS — I10 Essential (primary) hypertension: Secondary | ICD-10-CM | POA: Diagnosis not present

## 2020-09-29 DIAGNOSIS — I950 Idiopathic hypotension: Secondary | ICD-10-CM

## 2020-09-29 DIAGNOSIS — G8929 Other chronic pain: Secondary | ICD-10-CM

## 2020-09-29 DIAGNOSIS — R52 Pain, unspecified: Secondary | ICD-10-CM

## 2020-09-29 DIAGNOSIS — F411 Generalized anxiety disorder: Secondary | ICD-10-CM

## 2020-09-29 LAB — CBC
HCT: 31.5 % — ABNORMAL LOW (ref 36.0–46.0)
Hemoglobin: 9.6 g/dL — ABNORMAL LOW (ref 12.0–15.0)
MCH: 25.6 pg — ABNORMAL LOW (ref 26.0–34.0)
MCHC: 30.5 g/dL (ref 30.0–36.0)
MCV: 84 fL (ref 80.0–100.0)
Platelets: 165 10*3/uL (ref 150–400)
RBC: 3.75 MIL/uL — ABNORMAL LOW (ref 3.87–5.11)
RDW: 15.6 % — ABNORMAL HIGH (ref 11.5–15.5)
WBC: 5 10*3/uL (ref 4.0–10.5)
nRBC: 0 % (ref 0.0–0.2)

## 2020-09-29 LAB — BASIC METABOLIC PANEL
Anion gap: 5 (ref 5–15)
BUN: 20 mg/dL (ref 8–23)
CO2: 24 mmol/L (ref 22–32)
Calcium: 8.5 mg/dL — ABNORMAL LOW (ref 8.9–10.3)
Chloride: 109 mmol/L (ref 98–111)
Creatinine, Ser: 1.11 mg/dL — ABNORMAL HIGH (ref 0.44–1.00)
GFR, Estimated: 55 mL/min — ABNORMAL LOW (ref >60)
Glucose, Bld: 219 mg/dL — ABNORMAL HIGH (ref 70–99)
Potassium: 4 mmol/L (ref 3.5–5.1)
Sodium: 138 mmol/L (ref 135–145)

## 2020-09-29 LAB — MAGNESIUM: Magnesium: 1.9 mg/dL (ref 1.7–2.4)

## 2020-09-29 LAB — URINE CULTURE

## 2020-09-29 LAB — GLUCOSE, CAPILLARY
Glucose-Capillary: 171 mg/dL — ABNORMAL HIGH (ref 70–99)
Glucose-Capillary: 263 mg/dL — ABNORMAL HIGH (ref 70–99)

## 2020-09-29 MED ORDER — MIDODRINE HCL 5 MG PO TABS: 5.0000 mg | ORAL_TABLET | Freq: Three times a day (TID) | ORAL | 2 refills | Status: AC

## 2020-09-29 MED ORDER — MIDODRINE HCL 5 MG PO TABS: 5.0000 mg | ORAL_TABLET | Freq: Three times a day (TID) | ORAL | Status: DC

## 2020-09-29 MED ORDER — METOPROLOL TARTRATE 25 MG PO TABS: 12.5000 mg | ORAL_TABLET | Freq: Two times a day (BID) | ORAL | Status: DC

## 2020-09-29 NOTE — Discharge Summary (Signed)
Physician Discharge Summary   [name]  female DOB: [birth year]  FTD:[id number]  PCP: [name], NP  Admit date: 09/27/2020 Discharge date: 09/29/2020  Admitted From: home Disposition:  home Home Health: Yes  PT/OT/RN/SLP CODE STATUS: Full code  Discharge Instructions    Discharge instructions   Complete by: As directed    You have a complete stroke workup, and you have no new strokes.  You have COVID infection, but no symptom from it.  We have ordered you home health physical therapy and speech therapy.  Be sure to follow up with urology for possible bladder cancer, as we have discussed.   Dr. Enzo Bi - -   Discharge instructions   Complete by: As directed    Per neurology's recommendation, to help your dizziness when up.  Non-Pharmacologic RecsforOrthostatic Hypotension 1) Lifestyle modification. These measures include:             - Arising slowly, in stages, from supine to seated to standing. This maneuver is most important in the morning, when orthostatic tolerance is lowest.             - Avoiding straining, coughing, and walking in hot weather.These activities reduce venous return and worsen orthostatic hypotension.             - Maintaining hydration and avoiding over-heating.             - Raising the head of the bed 30 to 45 degrees decreases renal perfusion, thereby activating the renin-angiotensin-aldosterone system and decreasing nocturnal diuresis, which can be pronounced in these patients. These changes relieve orthostatic hypotension by expandingextracellular fluid volume and may reduce end organ damage by reducing supine HTN.             2) Exercise - walking 30 minutes a day, exercise in a swimming pool, exercise in a recumbent or seated position (using a stationary bike or rowing machine)             3) Abdominal binders             4)Compression Stockings             5) Increased salt and water intake to 2 L to 2.5 L of water a  day             6) Modification of meals:             - Avoiding large meals             - Ingesting meals low in carbohydrates             - Alcohol should be avoided during the day as it is a vasodilator             - Drink water with meals             - Avoiding activities or sudden standing immediately after eating             7) Physical Countermaneuvers during daily activities: leg crossing, standing on tip toes, squatting       Hospital Course:  For full details, please see H&P, progress notes, consult notes and ancillary notes.  Briefly,  Rita Gudiel Pooleis a 65 y.o.femalewith medical history significant forhistory of afib not on anticoagulation, frequent and multiple falls in the last 3-4 years, hypertension,fibromyalgia,obesity, presented to the emergency department for chief concerns of stroke-like symptoms.  She endorsed difficulty speaking and walking for 3 days.  She endorsed numbness in the left side for 5 days. She stated that she did not present to the emergency department sooner because she had a cardiology appointment on 09/27/2020.She also endorsed new shortness of breath and cough.  She fell three times prior to presentation and endorsed head trauma and endorsed lost of consicousness. She stated she was out for several minutes and woke up in the kitchen. Her spouse heard her fall and called EMS   Vaccinations: she is fully vaccinated for covid with three doses.   Stroke-like symptoms, acute infarct ruled out MIR brain showed No new stroke, does have remote lacunar strokes, most notable in pons.  MR Angio head and Carotid duplex was negative for LVO or any significant stenosis.  Neuro consulted.  Per neuro, "If she presents with new neurological deficit in the future, if within the acute stroke window, please activate the stroke code pathway. If outside the window for any intervention, would recommend getting just an MRI Brain to start with and further workup can  be obtained after the MRI." Pt continued on ASA 81 mg daily, and home statin.  Paroxysmal atrial fibrillation not on anticoagulation  Cardiologyhas been consulted.  Continued metop at decreased 12.5 mg BID.  Will not start anticoagulation given her frequent falls.  Orthostatic hypotension, chronic Pt received MIVF and encouraged oral hydration.  Decreased metop to 12.5 mg BID.  non-pharm recs to help with orthostasis per neuro, which is included in discharge instruction.    Per cardiology, "Patient known to me from clinic, long history orthostasis and falls, Has been treated in the past with midodrine.  On last clinic visit had 40 point drop in systolic pressure, midodrine had been restarted.  Long history of poorly controlled diabetes, alcohol use, liver cirrhosis, chronic nausea/anorexia/gastroparesis Prior episodes of syncope felt secondary to orthostasis, improved with midodrine."  Midodrine, however, was not on pt's home med list prior to presentation.  Midodrine 5 mg TID prescribed at discharge.  Pt will continue followup with outpatient cardiology.  COVID-19 infection patient is saturating on room air though she does endorse shortness of breath which is chronic.  CXR no active disease.  no need to start covid tx.  Hypertension Home metop decreased to 12.5 mg BID.  Urinary tract infection, ruled out Chronic dysuria Pt has a hx of pyuria and hematuria and chronic dysuria.  Pt was advised to follow up with urology as outpatient for concern of bladder cancer, but pt now said she doesn't remember.  Ceftriaxone d/c'ed.  Pt was again advised to follow up with urology as outpatient.  Insulin-dependent diabetes mellitus2 Hyperglycemia  Continued home Levemir 30u nightly.  Hyperlipidemia Continued home statin   Discharge Diagnoses:  Principal Problem:   Stroke [facility]) Active Problems:   DEPRESSION/ANXIETY   Essential hypertension   Atrial fibrillation (HCC)   Urinary tract  infection, acute   PAF (paroxysmal atrial fibrillation) (HCC)   Chronic generalized pain   CAD (coronary artery disease)   GAD (generalized anxiety disorder)   CKD (chronic kidney disease), stage III (HCC)   Weakness    Discharge Instructions:  Allergies as of 09/29/2020      Reactions   Aspirin Rash   Codeine Sulfate Rash   Erythromycin Rash   Reaction:  Fever    Prednisone Swelling   Rosiglitazone Maleate Swelling   Tetanus-diphtheria Toxoids Td Rash, Other (See Comments)   Reaction:  Fever       Medication List    TAKE these medications  Accu-Chek Aviva Plus test strip Generic drug: glucose blood Use as instructed to test blood sugar up to 4 times daily   albuterol 108 (90 Base) MCG/ACT inhaler Commonly known as: VENTOLIN HFA Inhale 2 puffs into the lungs every 4 (four) hours as needed for wheezing or shortness of breath.   atorvastatin 40 MG tablet Commonly known as: LIPITOR Take 40 mg by mouth at bedtime.   cyclobenzaprine 10 MG tablet Commonly known as: FLEXERIL Take 1 tablet (10 mg total) by mouth 3 (three) times daily as needed for muscle spasms. Must last 30 days. What changed: when to take this   Levemir FlexTouch 100 UNIT/ML FlexPen Generic drug: insulin detemir INJECT 30 UNITS SUBCUTANEOUSLY EVERY MORNING AND AT BEDTIME   Melatonin 10 MG Caps Take 1 tablet by mouth at bedtime.   metoprolol tartrate 25 MG tablet Commonly known as: LOPRESSOR Take 0.5 tablets (12.5 mg total) by mouth 2 (two) times daily. This is a decrease from your previous 25 mg twice daily. What changed:   how much to take  additional instructions   midodrine 5 MG tablet Commonly known as: PROAMATINE Take 1 tablet (5 mg total) by mouth 3 (three) times daily with meals. To help prevent blood pressure dropping when you stand.   nitroGLYCERIN 0.3 MG SL tablet Commonly known as: Nitrostat Place 1 tablet (0.3 mg total) under the tongue every 5 (five) minutes as needed for chest  pain.   oxyCODONE 5 MG immediate release tablet Commonly known as: Oxy IR/ROXICODONE Take 1 tablet (5 mg total) by mouth daily for 7 days. Max: 1/day. Must last 7 days. What changed: Another medication with the same name was removed. Continue taking this medication, and follow the directions you see here.   pantoprazole 40 MG tablet Commonly known as: PROTONIX Take 1 tablet (40 mg total) by mouth daily.   sertraline 50 MG tablet Commonly known as: ZOLOFT TAKE 1 TABLET BY MOUTH DAILY   Spacer/Aero-Hold Chamber Bags Misc Use spacer with inhaler when you have shortness of breath   TechLite Pen Needles 32G X 6 MM Misc Generic drug: Insulin Pen Needle USE TWICE DAILY AS DIRECTED        Follow-up Information    [name], NP. Go on 10/07/2020.   Specialty: Internal Medicine Why: @11 :40am Contact information: [address] [place] [address] [phone]        Minna Merritts, MD. Go on 10/10/2020.   Specialty: Cardiology Why: @ 9:30am Contact information: [place] [address] [phone]        Billey Co, MD. Schedule an appointment as soon as possible for a visit in 1 month.   Specialty: Urology Why: assess for bladder cancer.    Offic closed at this time patient to make own follow up appt Contact information: [address] [place] [address] [phone]               Allergies  Allergen Reactions  . Aspirin Rash  . Codeine Sulfate Rash  . Erythromycin Rash    Reaction:  Fever   . Prednisone Swelling  . Rosiglitazone Maleate Swelling  . Tetanus-Diphtheria Toxoids Td Rash and Other (See Comments)    Reaction:  Fever      The results of significant diagnostics from this hospitalization (including imaging, microbiology, ancillary and laboratory) are listed below for reference.   Consultations:   Procedures/Studies: CT HEAD WO CONTRAST  Result Date: 09/27/2020 CLINICAL  DATA:  Fall  with head and neck trauma.  Left-sided weakness EXAM: CT HEAD WITHOUT CONTRAST CT CERVICAL SPINE WITHOUT CONTRAST TECHNIQUE: Multidetector CT imaging of the head and cervical spine was performed following the standard protocol without intravenous contrast. Multiplanar CT image reconstructions of the cervical spine were also generated. COMPARISON:  None. FINDINGS: CT HEAD FINDINGS Brain: No evidence of acute infarction, hemorrhage, hydrocephalus, extra-axial collection or mass lesion/mass effect. Scattered low-density changes within the periventricular and subcortical white matter compatible with chronic microvascular ischemic change. Mild diffuse cerebral volume loss. Vascular: Atherosclerotic calcifications involving the large vessels of the skull base. No unexpected hyperdense vessel. Skull: Normal. Negative for fracture or focal lesion. Sinuses/Orbits: New near complete opacification of the right frontal sinus. Chronic partial opacification of the left mastoid air cells. Visualized orbital structures appear within normal limits. Other: None. CT CERVICAL SPINE FINDINGS Alignment: Facet joints are aligned without dislocation or traumatic listhesis. Dens and lateral masses are aligned. Straightening of the cervical lordosis. Skull base and vertebrae: No acute fracture. No primary bone lesion or focal pathologic process. Soft tissues and spinal canal: No prevertebral fluid or swelling. No visible canal hematoma. Disc levels: Mild degenerative disc disease of C5-6. Findings similar to prior. Upper chest: Included lung apices are clear. Other: None. IMPRESSION: 1. No acute intracranial findings. 2. No evidence of acute fracture or traumatic listhesis of the cervical spine. 3. New right frontal sinus opacification.  Correlate for sinusitis. 4. Chronic nonspecific partial opacification of the left mastoid air cells. Electronically Signed   By: Davina Poke D.O.   On: 09/27/2020 13:50   CT Cervical Spine Wo  Contrast  Result Date: 09/27/2020 CLINICAL DATA:  Fall with head and neck trauma.  Left-sided weakness EXAM: CT HEAD WITHOUT CONTRAST CT CERVICAL SPINE WITHOUT CONTRAST TECHNIQUE: Multidetector CT imaging of the head and cervical spine was performed following the standard protocol without intravenous contrast. Multiplanar CT image reconstructions of the cervical spine were also generated. COMPARISON:  None. FINDINGS: CT HEAD FINDINGS Brain: No evidence of acute infarction, hemorrhage, hydrocephalus, extra-axial collection or mass lesion/mass effect. Scattered low-density changes within the periventricular and subcortical white matter compatible with chronic microvascular ischemic change. Mild diffuse cerebral volume loss. Vascular: Atherosclerotic calcifications involving the large vessels of the skull base. No unexpected hyperdense vessel. Skull: Normal. Negative for fracture or focal lesion. Sinuses/Orbits: New near complete opacification of the right frontal sinus. Chronic partial opacification of the left mastoid air cells. Visualized orbital structures appear within normal limits. Other: None. CT CERVICAL SPINE FINDINGS Alignment: Facet joints are aligned without dislocation or traumatic listhesis. Dens and lateral masses are aligned. Straightening of the cervical lordosis. Skull base and vertebrae: No acute fracture. No primary bone lesion or focal pathologic process. Soft tissues and spinal canal: No prevertebral fluid or swelling. No visible canal hematoma. Disc levels: Mild degenerative disc disease of C5-6. Findings similar to prior. Upper chest: Included lung apices are clear. Other: None. IMPRESSION: 1. No acute intracranial findings. 2. No evidence of acute fracture or traumatic listhesis of the cervical spine. 3. New right frontal sinus opacification.  Correlate for sinusitis. 4. Chronic nonspecific partial opacification of the left mastoid air cells. Electronically Signed   By: Davina Poke D.O.    On: 09/27/2020 13:50   MR ANGIO HEAD WO CONTRAST  Result Date: 09/27/2020 CLINICAL DATA:  Initial evaluation for acute stroke. EXAM: MRI HEAD WITHOUT CONTRAST MRA HEAD WITHOUT CONTRAST TECHNIQUE: Multiplanar, multiecho pulse sequences of the brain and surrounding structures  were obtained without intravenous contrast. Angiographic images of the head were obtained using MRA technique without contrast. COMPARISON:  Prior CT from earlier the same day. FINDINGS: MRI HEAD FINDINGS Brain: Mild diffuse prominence of the CSF containing spaces compatible generalized age-related cerebral atrophy. Patchy and confluent T2/FLAIR hyperintensity within the periventricular and deep white matter of both cerebral hemispheres most consistent with chronic small vessel ischemic disease, mild to moderate in nature. Patchy involvement of the central pons with probable superimposed remote lacunar infarct. Subcentimeter focus of FLAIR hyperintensity involving the left occipital cortex likely reflects a small remote cortical infarct (series 22, image 26). Tiny remote right cerebellar infarct noted as well (series 17, image 8). No abnormal foci of restricted diffusion to suggest acute or subacute ischemia. Gray-white matter differentiation maintained. No other areas of remote cortical infarction. No foci of susceptibility artifact to suggest acute or chronic intracranial hemorrhage. No mass lesion, midline shift or mass effect. No hydrocephalus or extra-axial fluid collection. Incidental note made of a partially empty sella. Midline structures intact. Vascular: Major intracranial vascular flow voids are maintained. Skull and upper cervical spine: Craniocervical junction within normal limits. Bone marrow signal intensity within normal limits. Hyperostosis frontalis interna noted. No scalp soft tissue abnormality. Sinuses/Orbits: Globes and orbital soft tissues demonstrate no acute finding. Chronic right frontoethmoidal sinusitis, with  additional mild scattered mucosal thickening within the ethmoidal air cells and maxillary sinuses. Superimposed small left maxillary sinus retention cyst noted. Left mastoid and middle ear effusion noted, of uncertain significance. Inner ear structures grossly within normal limits. No abnormality about the visualized nasopharynx. Other: None. MRA HEAD FINDINGS ANTERIOR CIRCULATION: Visualized distal cervical segments of the internal carotid arteries are widely patent with antegrade flow. Petrous, cavernous, and supraclinoid ICAs widely patent without stenosis or other abnormality. A1 segments widely patent. Normal anterior communicating artery complex. Anterior cerebral arteries widely patent their distal aspects without stenosis. No M1 stenosis or occlusion. Negative MCA bifurcations. Distal MCA branches well perfused and symmetric. POSTERIOR CIRCULATION: Both vertebral arteries widely patent to the vertebrobasilar junction without stenosis. Both PICA origins patent and normal. Basilar patent to its distal aspect without stenosis. Superior cerebellar arteries are patent proximally. Left PCA supplied via the basilar. Predominant fetal type origin of the right PCA. Both PCAs well perfused to their distal aspects without stenosis. No intracranial aneurysm. IMPRESSION: MRI HEAD IMPRESSION: 1. No acute intracranial infarct or other abnormality. 2. Age-related cerebral atrophy with mild-to-moderate chronic small vessel ischemic disease, with a few scattered small remote ischemic infarcts as above. 3. Left mastoid and middle ear effusion, of uncertain significance. Correlation with any symptomatology and physical exam recommended. 4. Chronic right frontoethmoidal sinusitis. MRA HEAD IMPRESSION: Normal intracranial MRA. No large vessel occlusion, hemodynamically significant stenosis, or other acute vascular abnormality. Electronically Signed   By: Jeannine Boga M.D.   On: 09/27/2020 22:37   MR BRAIN WO  CONTRAST  Result Date: 09/27/2020 CLINICAL DATA:  Initial evaluation for acute stroke. EXAM: MRI HEAD WITHOUT CONTRAST MRA HEAD WITHOUT CONTRAST TECHNIQUE: Multiplanar, multiecho pulse sequences of the brain and surrounding structures were obtained without intravenous contrast. Angiographic images of the head were obtained using MRA technique without contrast. COMPARISON:  Prior CT from earlier the same day. FINDINGS: MRI HEAD FINDINGS Brain: Mild diffuse prominence of the CSF containing spaces compatible generalized age-related cerebral atrophy. Patchy and confluent T2/FLAIR hyperintensity within the periventricular and deep white matter of both cerebral hemispheres most consistent with chronic small vessel ischemic disease, mild to moderate in nature.  Patchy involvement of the central pons with probable superimposed remote lacunar infarct. Subcentimeter focus of FLAIR hyperintensity involving the left occipital cortex likely reflects a small remote cortical infarct (series 22, image 26). Tiny remote right cerebellar infarct noted as well (series 17, image 8). No abnormal foci of restricted diffusion to suggest acute or subacute ischemia. Gray-white matter differentiation maintained. No other areas of remote cortical infarction. No foci of susceptibility artifact to suggest acute or chronic intracranial hemorrhage. No mass lesion, midline shift or mass effect. No hydrocephalus or extra-axial fluid collection. Incidental note made of a partially empty sella. Midline structures intact. Vascular: Major intracranial vascular flow voids are maintained. Skull and upper cervical spine: Craniocervical junction within normal limits. Bone marrow signal intensity within normal limits. Hyperostosis frontalis interna noted. No scalp soft tissue abnormality. Sinuses/Orbits: Globes and orbital soft tissues demonstrate no acute finding. Chronic right frontoethmoidal sinusitis, with additional mild scattered mucosal thickening  within the ethmoidal air cells and maxillary sinuses. Superimposed small left maxillary sinus retention cyst noted. Left mastoid and middle ear effusion noted, of uncertain significance. Inner ear structures grossly within normal limits. No abnormality about the visualized nasopharynx. Other: None. MRA HEAD FINDINGS ANTERIOR CIRCULATION: Visualized distal cervical segments of the internal carotid arteries are widely patent with antegrade flow. Petrous, cavernous, and supraclinoid ICAs widely patent without stenosis or other abnormality. A1 segments widely patent. Normal anterior communicating artery complex. Anterior cerebral arteries widely patent their distal aspects without stenosis. No M1 stenosis or occlusion. Negative MCA bifurcations. Distal MCA branches well perfused and symmetric. POSTERIOR CIRCULATION: Both vertebral arteries widely patent to the vertebrobasilar junction without stenosis. Both PICA origins patent and normal. Basilar patent to its distal aspect without stenosis. Superior cerebellar arteries are patent proximally. Left PCA supplied via the basilar. Predominant fetal type origin of the right PCA. Both PCAs well perfused to their distal aspects without stenosis. No intracranial aneurysm. IMPRESSION: MRI HEAD IMPRESSION: 1. No acute intracranial infarct or other abnormality. 2. Age-related cerebral atrophy with mild-to-moderate chronic small vessel ischemic disease, with a few scattered small remote ischemic infarcts as above. 3. Left mastoid and middle ear effusion, of uncertain significance. Correlation with any symptomatology and physical exam recommended. 4. Chronic right frontoethmoidal sinusitis. MRA HEAD IMPRESSION: Normal intracranial MRA. No large vessel occlusion, hemodynamically significant stenosis, or other acute vascular abnormality. Electronically Signed   By: Jeannine Boga M.D.   On: 09/27/2020 22:37   US Carotid Bilateral (at [facility] and [facility] only)  Result Date:  09/27/2020 CLINICAL DATA:  Stroke-like symptoms EXAM: BILATERAL CAROTID DUPLEX ULTRASOUND TECHNIQUE: Pearline Cables scale imaging, color Doppler and duplex ultrasound were performed of bilateral carotid and vertebral arteries in the neck. COMPARISON:  None FINDINGS: Criteria: Quantification of carotid stenosis is based on velocity parameters that correlate the residual internal carotid diameter with NASCET-based stenosis levels, using the diameter of the distal internal carotid lumen as the denominator for stenosis measurement. The following velocity measurements were obtained: RIGHT ICA: 49/12 cm/sec CCA: 95/63 cm/sec SYSTOLIC ICA/CCA RATIO:  0.6 ECA: 56 cm/sec LEFT ICA: 68/23 cm/sec CCA: 87/56 cm/sec SYSTOLIC ICA/CCA RATIO:  1.2 ECA: 104 cm/sec RIGHT CAROTID ARTERY: Preliminary grayscale images demonstrates tortuosity of the carotid artery. Minimal atherosclerotic plaque is noted. Evaluation of the waveforms, velocities and flow velocity ratios however demonstrate no evidence of focal hemodynamically significant stenosis. RIGHT VERTEBRAL ARTERY:  Antegrade in nature. LEFT CAROTID ARTERY: Preliminary grayscale images demonstrate mild atherosclerotic plaque. Waveforms, velocities and flow velocity ratios however demonstrate no evidence of focal  hemodynamically significant stenosis. LEFT VERTEBRAL ARTERY:  Antegrade in nature. IMPRESSION: Minimal atherosclerotic plaque bilaterally. No focal hemodynamically significant carotid stenosis is seen. Electronically Signed   By: Inez Catalina M.D.   On: 09/27/2020 20:15   DG Chest Port 1 View  Result Date: 09/27/2020 CLINICAL DATA:  Possible stroke EXAM: PORTABLE CHEST 1 VIEW COMPARISON:  08/27/2020 FINDINGS: Mildly diminished lung volumes. Borderline to mild cardiomegaly. No focal opacity, pleural effusion or pneumothorax IMPRESSION: No active disease. Borderline to mild cardiomegaly. Electronically Signed   By: Donavan Foil M.D.   On: 09/27/2020 19:21   ECHOCARDIOGRAM COMPLETE  BUBBLE STUDY  Result Date: 09/28/2020    ECHOCARDIOGRAM REPORT   Patient Name:   REBECCA CAIRNS Date of Exam: 09/28/2020 Medical Rec #:  [id number]       Height:       63.0 in Accession #:    [id number]      Weight:       171.0 lb Date of Birth:  September 24, 1955       BSA:          1.809 m Patient Age:    65 years        BP:           154/74 mmHg Patient Gender: F               HR:           85 bpm. Exam Location:  [facility] Procedure: 2D Echo, Color Doppler, Cardiac Doppler and Saline Contrast Bubble            Study Indications:     I63.9 Stroke  History:         Patient has prior history of Echocardiogram examinations, most                  recent 07/18/2020. TIA; Risk Factors:Hypertension and Diabetes.                  Pt tested positive for COVID-19 on 09/27/2020.  Sonographer:     Charmayne Sheer RDCS (AE) Referring Phys:  [id number] [name] N [facility] Diagnosing Phys: Kate Sable MD  Sonographer Comments: Suboptimal apical window and no subcostal window. Image acquisition challenging due to patient body habitus and Image acquisition challenging due to respiratory motion. IMPRESSIONS  1. Left ventricular ejection fraction, by estimation, is 55 to 60%. The left ventricle has normal function. The left ventricle has no regional wall motion abnormalities. Left ventricular diastolic parameters are consistent with Grade I diastolic dysfunction (impaired relaxation).  2. Right ventricular systolic function is normal. The right ventricular size is normal.  3. The mitral valve is normal in structure. No evidence of mitral valve regurgitation.  4. The aortic valve is grossly normal. Aortic valve regurgitation is not visualized. FINDINGS  Left Ventricle: Left ventricular ejection fraction, by estimation, is 55 to 60%. The left ventricle has normal function. The left ventricle has no regional wall motion abnormalities. The left ventricular internal cavity size was normal in size. There is  no left ventricular hypertrophy. Left ventricular  diastolic parameters are consistent with Grade I diastolic dysfunction (impaired relaxation). Right Ventricle: The right ventricular size is normal. No increase in right ventricular wall thickness. Right ventricular systolic function is normal. Left Atrium: Left atrial size was normal in size. Right Atrium: Right atrial size was normal in size. Pericardium: There is no evidence of pericardial effusion. Mitral Valve: The mitral valve is normal in structure. No evidence of mitral  valve regurgitation. MV peak gradient, 4.2 mmHg. The mean mitral valve gradient is 2.0 mmHg. Tricuspid Valve: The tricuspid valve is grossly normal. Tricuspid valve regurgitation is not demonstrated. Aortic Valve: The aortic valve is grossly normal. Aortic valve regurgitation is not visualized. Aortic valve mean gradient measures 1.0 mmHg. Aortic valve peak gradient measures 2.8 mmHg. Aortic valve area, by VTI measures 4.68 cm. Pulmonic Valve: The pulmonic valve was not well visualized. Pulmonic valve regurgitation is not visualized. Aorta: The aortic root is normal in size and structure. IAS/Shunts: No atrial level shunt detected by color flow Doppler. Agitated saline contrast was given intravenously to evaluate for intracardiac shunting.  LEFT VENTRICLE PLAX 2D LVIDd:         4.20 cm  Diastology LVIDs:         3.10 cm  LV e' medial:    6.85 cm/s LV PW:         1.30 cm  LV E/e' medial:  6.1 LV IVS:        0.80 cm  LV e' lateral:   5.00 cm/s LVOT diam:     2.40 cm  LV E/e' lateral: 8.3 LV SV:         66 LV SV Index:   37 LVOT Area:     4.52 cm  LEFT ATRIUM           Index LA diam:      2.80 cm 1.55 cm/m LA Vol (A4C): 61.9 ml 34.22 ml/m  AORTIC VALVE                   PULMONIC VALVE AV Area (Vmax):    3.78 cm    PV Vmax:       1.05 m/s AV Area (Vmean):   4.07 cm    PV Vmean:      70.200 cm/s AV Area (VTI):     4.68 cm    PV VTI:        0.171 m AV Vmax:           84.30 cm/s  PV Peak grad:  4.4 mmHg AV Vmean:          55.400 cm/s PV Mean  grad:  2.0 mmHg AV VTI:            0.141 m AV Peak Grad:      2.8 mmHg AV Mean Grad:      1.0 mmHg LVOT Vmax:         70.50 cm/s LVOT Vmean:        49.800 cm/s LVOT VTI:          0.146 m LVOT/AV VTI ratio: 1.04  AORTA Ao Root diam: 3.60 cm MITRAL VALVE MV Area (PHT): 8.82 cm    SHUNTS MV Area VTI:   5.24 cm    Systemic VTI:  0.15 m MV Peak grad:  4.2 mmHg    Systemic Diam: 2.40 cm MV Mean grad:  2.0 mmHg MV Vmax:       1.02 m/s MV Vmean:      61.5 cm/s MV Decel Time: 86 msec MV E velocity: 41.60 cm/s MV A velocity: 86.50 cm/s MV E/A ratio:  0.48 Kate Sable MD Electronically signed by Kate Sable MD Signature Date/Time: 09/28/2020/3:55:50 PM    Final       Labs: BNP (last 3 results) Recent Labs    07/17/20 0610 08/27/20 1216  BNP 132.0* 650.3*   Basic Metabolic Panel: Recent Labs  Lab 09/27/20  1319 09/29/20 0412  NA 136 138  K 3.2* 4.0  CL 109 109  CO2 17* 24  GLUCOSE 380* 219*  BUN 13 20  CREATININE 0.95 1.11*  CALCIUM 7.5* 8.5*  MG  --  1.9   Liver Function Tests: Recent Labs  Lab 09/27/20 1319  AST 27  ALT 33  ALKPHOS 120  BILITOT 0.8  PROT 6.3*  ALBUMIN 3.0*   No results for input(s): LIPASE, AMYLASE in the last 168 hours. No results for input(s): AMMONIA in the last 168 hours. CBC: Recent Labs  Lab 09/27/20 1319 09/29/20 0412  WBC 5.5 5.0  NEUTROABS 4.4  --   HGB 10.3* 9.6*  HCT 33.4* 31.5*  MCV 83.5 84.0  PLT 159 165   Cardiac Enzymes: No results for input(s): CKTOTAL, CKMB, CKMBINDEX, TROPONINI in the last 168 hours. BNP: Invalid input(s): POCBNP CBG: Recent Labs  Lab 09/28/20 0755 09/28/20 1137 09/28/20 1723 09/28/20 2059 09/29/20 0749  GLUCAP 208* 160* 193* 281* 171*   D-Dimer No results for input(s): DDIMER in the last 72 hours. Hgb A1c Recent Labs    09/28/20 0647  HGBA1C 9.1*   Lipid Profile Recent Labs    09/28/20 0647  CHOL 143  HDL 45  LDLCALC 76  TRIG 111  CHOLHDL 3.2   Thyroid function studies Recent  Labs    09/27/20 1652  TSH 3.869   Anemia work up No results for input(s): VITAMINB12, FOLATE, FERRITIN, TIBC, IRON, RETICCTPCT in the last 72 hours. Urinalysis    Component Value Date/Time   COLORURINE YELLOW (A) 09/27/2020 1547   APPEARANCEUR HAZY (A) 09/27/2020 1547   APPEARANCEUR Cloudy (A) 12/02/2019 1259   LABSPEC 1.027 09/27/2020 1547   LABSPEC 1.033 10/08/2014 0144   PHURINE 6.0 09/27/2020 1547   GLUCOSEU >=500 (A) 09/27/2020 1547   GLUCOSEU >=500 10/08/2014 0144   HGBUR LARGE (A) 09/27/2020 1547   BILIRUBINUR NEGATIVE 09/27/2020 1547   BILIRUBINUR Negative 12/02/2019 1259   BILIRUBINUR Negative 10/08/2014 0144   KETONESUR NEGATIVE 09/27/2020 1547   PROTEINUR 100 (A) 09/27/2020 1547   UROBILINOGEN 0.2 11/04/2019 1157   UROBILINOGEN 0.2 05/27/2015 1936   NITRITE NEGATIVE 09/27/2020 1547   LEUKOCYTESUR LARGE (A) 09/27/2020 1547   LEUKOCYTESUR Negative 10/08/2014 0144   Sepsis Labs Invalid input(s): PROCALCITONIN,  WBC,  LACTICIDVEN Microbiology Recent Results (from the past 240 hour(s))  Urine Culture     Status: Abnormal   Collection Time: 09/27/20  4:32 PM   Specimen: Urine, Random  Result Value Ref Range Status   Specimen Description   Final    URINE, RANDOM Performed at [facility], [address], [place], [place] [address]    Special Requests   Final    NONE Performed at [facility], [address] [place], [place] [address]    Culture MULTIPLE SPECIES PRESENT, SUGGEST RECOLLECTION (A)  Final   Report Status 09/29/2020 FINAL  Final  Resp Panel by RT-PCR (Flu A&B, Covid) Nasopharyngeal Swab     Status: Abnormal   Collection Time: 09/27/20  4:52 PM   Specimen: Nasopharyngeal Swab; Nasopharyngeal(NP) swabs in vial transport medium  Result Value Ref Range Status   SARS Coronavirus 2 by RT PCR POSITIVE (A) NEGATIVE Final    Comment: RESULT CALLED TO, READ BACK BY AND VERIFIED WITH: CALLED TO MIMI FRAGART,RN 1801 09/27/20  DB (NOTE) SARS-CoV-2 target nucleic acids are DETECTED.  The SARS-CoV-2 RNA is generally detectable in upper respiratory specimens during the acute phase of infection. Positive  results are indicative of the presence of the identified virus, but do not rule out bacterial infection or co-infection with other pathogens not detected by the test. Clinical correlation with patient history and other diagnostic information is necessary to determine patient infection status. The expected result is Negative.  Fact Sheet for Patients: EntrepreneurPulse.com.au  Fact Sheet for Healthcare Providers: IncredibleEmployment.be  This test is not yet approved or cleared by the [place] FDA and  has been authorized for detection and/or diagnosis of SARS-CoV-2 by FDA under an Emergency Use Authorization (EUA).  This EUA will remain in effect (meaning this tes t can be used) for the duration of  the COVID-19 declaration under Section 564(b)(1) of the Act, 21 U.S.C. section 360bbb-3(b)(1), unless the authorization is terminated or revoked sooner.     Influenza A by PCR NEGATIVE NEGATIVE Final   Influenza B by PCR NEGATIVE NEGATIVE Final    Comment: (NOTE) The Xpert Xpress SARS-CoV-2/FLU/RSV plus assay is intended as an aid in the diagnosis of influenza from Nasopharyngeal swab specimens and should not be used as a sole basis for treatment. Nasal washings and aspirates are unacceptable for Xpert Xpress SARS-CoV-2/FLU/RSV testing.  Fact Sheet for Patients: EntrepreneurPulse.com.au  Fact Sheet for Healthcare Providers: IncredibleEmployment.be  This test is not yet approved or cleared by the [place] FDA and has been authorized for detection and/or diagnosis of SARS-CoV-2 by FDA under an Emergency Use Authorization (EUA). This EUA will remain in effect (meaning this test can be used) for the duration of the COVID-19  declaration under Section 564(b)(1) of the Act, 21 U.S.C. section 360bbb-3(b)(1), unless the authorization is terminated or revoked.  Performed at [facility], [address] [place], [place] [address]   MRSA PCR Screening     Status: None   Collection Time: 09/27/20  7:43 PM   Specimen: Nasopharyngeal  Result Value Ref Range Status   MRSA by PCR NEGATIVE NEGATIVE Final    Comment:        The GeneXpert MRSA Assay (FDA approved for NASAL specimens only), is one component of a comprehensive MRSA colonization surveillance program. It is not intended to diagnose MRSA infection nor to guide or monitor treatment for MRSA infections. Performed at [facility], [address] [place], [place] [address]      Total time spend on discharging this patient, including the last patient exam, discussing the hospital stay, instructions for ongoing care as it relates to all pertinent caregivers, as well as preparing the medical discharge records, prescriptions, and/or referrals as applicable, is 45 minutes.    Enzo Bi, MD  [facility] 09/29/2020, 12:38 PM

## 2020-09-29 NOTE — Plan of Care (Signed)
  Problem: Education: Goal: Knowledge of General Education information will improve Description: Including pain rating scale, medication(s)/side effects and non-pharmacologic comfort measures Outcome: Progressing   Problem: Health Behavior/Discharge Planning: Goal: Ability to manage health-related needs will improve Outcome: Progressing   Problem: Clinical Measurements: Goal: Ability to maintain clinical measurements within normal limits will improve Outcome: Progressing Goal: Will remain free from infection Outcome: Progressing Goal: Diagnostic test results will improve Outcome: Progressing Goal: Respiratory complications will improve Outcome: Progressing Goal: Cardiovascular complication will be avoided Outcome: Progressing   Problem: Activity: Goal: Risk for activity intolerance will decrease Outcome: Progressing   Problem: Nutrition: Goal: Adequate nutrition will be maintained Outcome: Progressing   Problem: Coping: Goal: Level of anxiety will decrease Outcome: Progressing   Problem: Elimination: Goal: Will not experience complications related to bowel motility Outcome: Progressing Goal: Will not experience complications related to urinary retention Outcome: Progressing   Problem: Pain Managment: Goal: General experience of comfort will improve Outcome: Progressing   Problem: Safety: Goal: Ability to remain free from injury will improve Outcome: Progressing   Problem: Skin Integrity: Goal: Risk for impaired skin integrity will decrease Outcome: Progressing   Problem: Education: Goal: Knowledge of secondary prevention will improve Outcome: Progressing Goal: Knowledge of patient specific risk factors addressed and post discharge goals established will improve Outcome: Progressing Goal: Individualized Educational Video(s) Outcome: Progressing   Problem: Education: Goal: Knowledge of disease or condition will improve Outcome: Progressing Goal: Knowledge  of secondary prevention will improve Outcome: Progressing

## 2020-09-29 NOTE — TOC Initial Note (Signed)
Transition of Care [facility]) - Initial/Assessment Note    Patient Details  Name: [name] MRN: [id number] Date of Birth: 1956/08/26  Transition of [facility]) CM/SW Contact:    Shelbie Hutching, RN Phone Number: 09/29/2020, 9:41 AM  Clinical Narrative:                 Patient placed under observation for stroke.  RNCM spoke with patient via phone.  Patient reports that she is from home with her husband.  Patient is open with [facility] for home health services, RN, PT. OT and SW- speech therapy will be added to the orders.  Patient has a walker and wheelchair at home and has no other equipment needs at this time.  Patient's family will be able to pick her up at discharge.    Expected Discharge Plan: [place] Barriers to Discharge: Barriers Resolved   Patient Goals and CMS Choice Patient states their goals for this hospitalization and ongoing recovery are:: Patient is ready to go home CMS Medicare.gov Compare Post Acute Care list provided to:: Patient Choice offered to / list presented to : Patient  Expected Discharge Plan and Services Expected Discharge Plan: [place]   Discharge Planning Services: CM Consult Post Acute Care Choice: [place] arrangements for the past 2 months: Mobile Home                 DME Arranged: N/A DME Agency: NA       HH Arranged: PT,OT,Speech Therapy,RN,Social Work [place] Agency: [facility] Date [place]: 09/29/20 Time [place]: [phone] Representative spoke with at [place]: [place] Arrangements/Services Living arrangements for the past 2 months: [place] with:: Spouse Patient language and need for interpreter reviewed:: Yes Do you feel safe going back to the place where you live?: Yes      Need for Family Participation in Patient Care: Yes (Comment) (COVID) Care giver support system in place?: Yes (comment) (husband) Current home services: DME (Cane,  walker, wheelchair) Criminal Activity/Legal Involvement Pertinent to Current Situation/Hospitalization: No - Comment as needed  Activities of Daily Living Home Assistive Devices/Equipment: Cytogeneticist (specify type),Other (Comment) (cane) ADL Screening (condition at time of admission) Patient's cognitive ability adequate to safely complete daily activities?: Yes Is the patient deaf or have difficulty hearing?: No Does the patient have difficulty seeing, even when wearing glasses/contacts?: No Does the patient have difficulty concentrating, remembering, or making decisions?: Yes Patient able to express need for assistance with ADLs?: Yes Does the patient have difficulty dressing or bathing?: No Independently performs ADLs?: Yes (appropriate for developmental age) Does the patient have difficulty walking or climbing stairs?: Yes Weakness of Legs: Left Weakness of Arms/Hands: Left  Permission Sought/Granted Permission sought to share information with : Case Manager,Family Supports,Other (comment) Permission granted to share information with : Yes, Verbal Permission Granted  Share Information with NAME: [name]  Permission granted to share info w AGENCY: home health agency  Permission granted to share info w Relationship: Spouse     Emotional Assessment   Attitude/Demeanor/Rapport: Engaged Affect (typically observed): Accepting Orientation: : Oriented to Self,Oriented to Place,Oriented to  Time,Oriented to Situation Alcohol / Substance Use: Not Applicable Psych Involvement: No (comment)  Admission diagnosis:  Shortness of breath [R06.02] Weakness [R53.1] Stroke ([place]) [I63.9] Left-sided muscle weakness [M62.81] Urinary tract infection, acute [N39.0] COVID-19 [U07.1] Patient Active Problem List   Diagnosis Date Noted  . Weakness 09/28/2020  .  COVID-19   . Stroke ([place]) 09/27/2020  . Atrial fibrillation with RVR ([place]) 08/27/2020  . Hypoglycemia 08/27/2020  . CKD (chronic  kidney disease), stage III ([place]) 08/27/2020  . Shortness of breath 07/16/2020  . [place] intertrigo 07/16/2020  . Sepsis ([place]) 11/23/2019  . Acute pyelonephritis 09/30/2019  . Acute pain of left foot 08/18/2019  . Type II diabetes mellitus with renal manifestations ([place]) 08/13/2019  . Weakness of both lower extremities 07/31/2019  . Altered mental status 07/14/2019  . GAD (generalized anxiety disorder) 06/11/2019  . Poor memory 06/11/2019  . Constipation 05/25/2019  . Acute medial meniscus tear of left knee 05/25/2019  . AKI (acute kidney injury) ([place]) 05/16/2019  . Closed fracture of left distal fibula 05/16/2019  . Sinus tachycardia 05/05/2019  . Congestive dilated cardiomyopathy ([place]) 04/14/2019  . CAD (coronary artery disease) 04/10/2019  . Closed fracture of lateral malleolus 04/10/2019  . Lumbar facet syndrome (Bilateral) (R>L) 03/09/2019  . Long term (current) use of anticoagulants 03/09/2019  . Unspecified cirrhosis of liver ([place]) 02/11/2019  . Hypoalbuminemia 02/11/2019  . Edema due to hypoalbuminemia 02/11/2019  . Lower extremity edema 01/26/2019  . Prolonged QT interval 12/17/2018  . Hypomagnesemia 12/17/2018  . Uncontrolled type 2 diabetes mellitus ([place]) 12/17/2018  . H/O TIA (transient ischemic attack) and stroke 12/17/2018  . Personal history of surgery to heart and great vessels, presenting hazards to health 12/17/2018  . DDD (degenerative disc disease), lumbar 12/17/2018  . Lumbar intervertebral disc displacement 12/17/2018  . Chronic musculoskeletal pain 12/17/2018  . Chronic generalized pain 12/17/2018  . Hyponatremia 12/17/2018  . Fibromyalgia syndrome 12/17/2018  . Other intervertebral disc displacement, lumbar region 12/17/2018  . Vitamin D deficiency 11/26/2018  . Elevated C-reactive protein (CRP) 11/26/2018  . Elevated sed rate 11/26/2018  . Chronic low back pain (Primary area of Pain) (Bilateral) (L>R) w/ sciatica (Bilateral) 11/25/2018  . Chronic  lower extremity pain (Secondary Area of Pain) (Bilateral) (L>R) 11/25/2018  . Chronic neck pain 11/25/2018  . Pharmacologic therapy 11/25/2018  . Disorder of skeletal system 11/25/2018  . Problems influencing health status 11/25/2018  . Renal mass 10/06/2018  . Chronic intermittent abdominal pain 09/25/2018  . Unspecified abdominal pain 09/25/2018  . Frequent falls 09/12/2018  . Orthostatic hypotension 08/18/2018  . Normochromic normocytic anemia 08/18/2018  . Acute renal failure superimposed on stage 3a chronic kidney disease ([place]) 08/18/2018  . History of hypothyroidism 08/14/2018  . Medical non-compliance 08/14/2018  . Diabetic ketoacidosis ([place]) 05/22/2018  . Urinary tract infection, acute 05/22/2018  . NSTEMI (non-ST elevated myocardial infarction) ([place]) 01/11/2016  . Tachy-brady syndrome ([place]) 12/28/2015  . PAF (paroxysmal atrial fibrillation) ([place]) 11/24/2015  . Type 2 diabetes mellitus with neurologic complication ([place]) 27/78/2423  . S/P cholecystectomy 11/23/2015  . Narcotic abuse ([place]) 11/22/2015  . Chronic superficial gastritis without bleeding 11/22/2015  . History of Clostridium difficile 11/22/2015  . History of TIA (transient ischemic attack) 11/22/2015  . Mixed hyperlipidemia 11/22/2015  . Diverticulosis 11/22/2015  . History of uterine cancer 11/22/2015  . Hypothyroidism, unspecified 11/22/2015  . Intractable cyclical vomiting with nausea 11/22/2015  . Community acquired pneumonia of left lower lobe of lung 11/14/2015  . Narcotic withdrawal ([place]) 11/11/2015  . Hyperlipidemia 08/31/2015  . Hypotension 08/06/2015  . Cryptogenic cirrhosis ([place]) 07/10/2015  . Atrial fibrillation ([place]) 07/10/2015  . Malnutrition of moderate degree 07/04/2015  . Symptomatic bradycardia 05/27/2015  . Chronic anemia 05/19/2015  . Thrombocytopenia ([place]) 05/19/2015  . Hypokalemia 04/06/2015  . Hyperlipidemia with target LDL  less than 100 04/06/2015  . Syncope 03/11/2015  . CVA  (cerebral vascular accident) ([place]) 02/15/2015  . Essential hypertension 01/12/2015  . Chronically on opiate therapy 01/12/2015  . Cirrhosis of liver not due to alcohol ([place]) 2016  . S/P Nissen fundoplication (without gastrostomy tube) procedure 05/11/2014  . Dysphagia 04/19/2014  . DEPRESSION/ANXIETY 06/27/2007  . Myofascial pain syndrome 06/27/2007  . Chronic pain syndrome 03/28/2007  . GERD 03/27/2007  . Diverticulosis of large intestine without perforation or abscess without bleeding 03/27/2007  . Proteinuria 03/27/2007   PCP:  [name], NP Pharmacy:   [place] [place] [facility] [address] [place] [address] Phone: [phone] Fax: [phone]     Social Determinants of Health (SDOH) Interventions    Readmission Risk Interventions Readmission Risk Prevention Plan 10/01/2019 07/18/2019 07/16/2019  Transportation Screening Complete Complete Complete  Medication Review Press photographer) Complete Complete Complete  PCP or Specialist appointment within 3-5 days of discharge Complete Complete -  [place] or Home Care Consult Complete Complete Complete  SW Recovery Care/Counseling Consult Complete Complete -  Palliative Care Screening Not Applicable Not Applicable Not [place] - Not Applicable Patient Refused  Some recent data might be hidden

## 2020-09-29 NOTE — Progress Notes (Signed)
Progress Note  Patient Name: [name] Date of Encounter: 09/29/2020  [facility] Cardiologist: [name], MD   Subjective   Patient known to me from clinic, long history orthostasis and falls, Has been treated in the past with midodrine On last clinic visit had 40 point drop in systolic pressure, midodrine had been restarted Long history of poorly controlled diabetes, alcohol use, liver cirrhosis, chronic nausea/anorexia/gastroparesis Prior episodes of syncope felt secondary to orthostasis, improved with midodrine  On rounds today, very weak getting up with nursing, requiring a walker at least 1 person if not two-person assist Sitting systolic pressure 099, down to 833 systolic on initial stand, after 2 to 3 minutes down to 90 systolic Reports legs are weak   Inpatient Medications    Scheduled Meds: . atorvastatin  40 mg Oral QHS  . cyclobenzaprine  10 mg Oral TID  . insulin aspart  0-15 Units Subcutaneous TID WC  . insulin aspart  0-5 Units Subcutaneous QHS  . insulin detemir  30 Units Subcutaneous Q2200  . metoprolol tartrate  12.5 mg Oral BID  . midodrine  5 mg Oral TID WC   Continuous Infusions:  PRN Meds: acetaminophen **OR** acetaminophen (TYLENOL) oral liquid 160 mg/5 mL **OR** acetaminophen, albuterol, labetalol, nitroGLYCERIN, oxyCODONE   Vital Signs    Vitals:   09/28/20 1601 09/29/20 0038 09/29/20 0417 09/29/20 0745  BP: (!) 153/75 (!) 151/72 137/65 130/68  Pulse: 70 72 76 79  Resp: 20 20 16 18   Temp: 98.3 F (36.8 C) 98 F (36.7 C) 98.3 F (36.8 C) 98.3 F (36.8 C)  TempSrc:    Oral  SpO2: 100% 96% 97% 95%  Weight:      Height:        Intake/Output Summary (Last 24 hours) at 09/29/2020 1358 Last data filed at 09/29/2020 0430 Gross per 24 hour  Intake --  Output 400 ml  Net -400 ml   Last 3 Weights 09/27/2020 09/27/2020 08/29/2020  Weight (lbs) 171 lb 171 lb 171 lb 15.3 oz  Weight (kg) 77.565 kg 77.565 kg 78 kg      Telemetry     Normal sinus rhythm- Personally Reviewed  ECG     - Personally Reviewed  Physical Exam   GEN: No acute distress.  Very frail, 1-2 person assist with standing up to walker Neck: No JVD Cardiac: RRR, no murmurs, rubs, or gallops.  Respiratory: Clear to auscultation bilaterally. GI: Soft, nontender, non-distended  MS: No edema; No deformity. Neuro:  Nonfocal  Psych: Normal affect   Labs    High Sensitivity Troponin:  No results for input(s): TROPONINIHS in the last 720 hours.    Chemistry Recent Labs  Lab 09/27/20 1319 09/29/20 0412  NA 136 138  K 3.2* 4.0  CL 109 109  CO2 17* 24  GLUCOSE 380* 219*  BUN 13 20  CREATININE 0.95 1.11*  CALCIUM 7.5* 8.5*  PROT 6.3*  --   ALBUMIN 3.0*  --   AST 27  --   ALT 33  --   ALKPHOS 120  --   BILITOT 0.8  --   GFRNONAA >60 55*  ANIONGAP 10 5     Hematology Recent Labs  Lab 09/27/20 1319 09/29/20 0412  WBC 5.5 5.0  RBC 4.00 3.75*  HGB 10.3* 9.6*  HCT 33.4* 31.5*  MCV 83.5 84.0  MCH 25.8* 25.6*  MCHC 30.8 30.5  RDW 15.5 15.6*  PLT 159 165    BNPNo results for input(s):  BNP, PROBNP in the last 168 hours.   DDimer No results for input(s): DDIMER in the last 168 hours.   Radiology    MR ANGIO HEAD WO CONTRAST  Result Date: 09/27/2020 CLINICAL DATA:  Initial evaluation for acute stroke. EXAM: MRI HEAD WITHOUT CONTRAST MRA HEAD WITHOUT CONTRAST TECHNIQUE: Multiplanar, multiecho pulse sequences of the brain and surrounding structures were obtained without intravenous contrast. Angiographic images of the head were obtained using MRA technique without contrast. COMPARISON:  Prior CT from earlier the same day. FINDINGS: MRI HEAD FINDINGS Brain: Mild diffuse prominence of the CSF containing spaces compatible generalized age-related cerebral atrophy. Patchy and confluent T2/FLAIR hyperintensity within the periventricular and deep white matter of both cerebral hemispheres most consistent with chronic small vessel ischemic  disease, mild to moderate in nature. Patchy involvement of the central pons with probable superimposed remote lacunar infarct. Subcentimeter focus of FLAIR hyperintensity involving the left occipital cortex likely reflects a small remote cortical infarct (series 22, image 26). Tiny remote right cerebellar infarct noted as well (series 17, image 8). No abnormal foci of restricted diffusion to suggest acute or subacute ischemia. Gray-white matter differentiation maintained. No other areas of remote cortical infarction. No foci of susceptibility artifact to suggest acute or chronic intracranial hemorrhage. No mass lesion, midline shift or mass effect. No hydrocephalus or extra-axial fluid collection. Incidental note made of a partially empty sella. Midline structures intact. Vascular: Major intracranial vascular flow voids are maintained. Skull and upper cervical spine: Craniocervical junction within normal limits. Bone marrow signal intensity within normal limits. Hyperostosis frontalis interna noted. No scalp soft tissue abnormality. Sinuses/Orbits: Globes and orbital soft tissues demonstrate no acute finding. Chronic right frontoethmoidal sinusitis, with additional mild scattered mucosal thickening within the ethmoidal air cells and maxillary sinuses. Superimposed small left maxillary sinus retention cyst noted. Left mastoid and middle ear effusion noted, of uncertain significance. Inner ear structures grossly within normal limits. No abnormality about the visualized nasopharynx. Other: None. MRA HEAD FINDINGS ANTERIOR CIRCULATION: Visualized distal cervical segments of the internal carotid arteries are widely patent with antegrade flow. Petrous, cavernous, and supraclinoid ICAs widely patent without stenosis or other abnormality. A1 segments widely patent. Normal anterior communicating artery complex. Anterior cerebral arteries widely patent their distal aspects without stenosis. No M1 stenosis or occlusion.  Negative MCA bifurcations. Distal MCA branches well perfused and symmetric. POSTERIOR CIRCULATION: Both vertebral arteries widely patent to the vertebrobasilar junction without stenosis. Both PICA origins patent and normal. Basilar patent to its distal aspect without stenosis. Superior cerebellar arteries are patent proximally. Left PCA supplied via the basilar. Predominant fetal type origin of the right PCA. Both PCAs well perfused to their distal aspects without stenosis. No intracranial aneurysm. IMPRESSION: MRI HEAD IMPRESSION: 1. No acute intracranial infarct or other abnormality. 2. Age-related cerebral atrophy with mild-to-moderate chronic small vessel ischemic disease, with a few scattered small remote ischemic infarcts as above. 3. Left mastoid and middle ear effusion, of uncertain significance. Correlation with any symptomatology and physical exam recommended. 4. Chronic right frontoethmoidal sinusitis. MRA HEAD IMPRESSION: Normal intracranial MRA. No large vessel occlusion, hemodynamically significant stenosis, or other acute vascular abnormality. Electronically Signed   By: Jeannine Boga M.D.   On: 09/27/2020 22:37   MR BRAIN WO CONTRAST  Result Date: 09/27/2020 CLINICAL DATA:  Initial evaluation for acute stroke. EXAM: MRI HEAD WITHOUT CONTRAST MRA HEAD WITHOUT CONTRAST TECHNIQUE: Multiplanar, multiecho pulse sequences of the brain and surrounding structures were obtained without intravenous contrast. Angiographic images of the head  were obtained using MRA technique without contrast. COMPARISON:  Prior CT from earlier the same day. FINDINGS: MRI HEAD FINDINGS Brain: Mild diffuse prominence of the CSF containing spaces compatible generalized age-related cerebral atrophy. Patchy and confluent T2/FLAIR hyperintensity within the periventricular and deep white matter of both cerebral hemispheres most consistent with chronic small vessel ischemic disease, mild to moderate in nature. Patchy  involvement of the central pons with probable superimposed remote lacunar infarct. Subcentimeter focus of FLAIR hyperintensity involving the left occipital cortex likely reflects a small remote cortical infarct (series 22, image 26). Tiny remote right cerebellar infarct noted as well (series 17, image 8). No abnormal foci of restricted diffusion to suggest acute or subacute ischemia. Gray-white matter differentiation maintained. No other areas of remote cortical infarction. No foci of susceptibility artifact to suggest acute or chronic intracranial hemorrhage. No mass lesion, midline shift or mass effect. No hydrocephalus or extra-axial fluid collection. Incidental note made of a partially empty sella. Midline structures intact. Vascular: Major intracranial vascular flow voids are maintained. Skull and upper cervical spine: Craniocervical junction within normal limits. Bone marrow signal intensity within normal limits. Hyperostosis frontalis interna noted. No scalp soft tissue abnormality. Sinuses/Orbits: Globes and orbital soft tissues demonstrate no acute finding. Chronic right frontoethmoidal sinusitis, with additional mild scattered mucosal thickening within the ethmoidal air cells and maxillary sinuses. Superimposed small left maxillary sinus retention cyst noted. Left mastoid and middle ear effusion noted, of uncertain significance. Inner ear structures grossly within normal limits. No abnormality about the visualized nasopharynx. Other: None. MRA HEAD FINDINGS ANTERIOR CIRCULATION: Visualized distal cervical segments of the internal carotid arteries are widely patent with antegrade flow. Petrous, cavernous, and supraclinoid ICAs widely patent without stenosis or other abnormality. A1 segments widely patent. Normal anterior communicating artery complex. Anterior cerebral arteries widely patent their distal aspects without stenosis. No M1 stenosis or occlusion. Negative MCA bifurcations. Distal MCA branches  well perfused and symmetric. POSTERIOR CIRCULATION: Both vertebral arteries widely patent to the vertebrobasilar junction without stenosis. Both PICA origins patent and normal. Basilar patent to its distal aspect without stenosis. Superior cerebellar arteries are patent proximally. Left PCA supplied via the basilar. Predominant fetal type origin of the right PCA. Both PCAs well perfused to their distal aspects without stenosis. No intracranial aneurysm. IMPRESSION: MRI HEAD IMPRESSION: 1. No acute intracranial infarct or other abnormality. 2. Age-related cerebral atrophy with mild-to-moderate chronic small vessel ischemic disease, with a few scattered small remote ischemic infarcts as above. 3. Left mastoid and middle ear effusion, of uncertain significance. Correlation with any symptomatology and physical exam recommended. 4. Chronic right frontoethmoidal sinusitis. MRA HEAD IMPRESSION: Normal intracranial MRA. No large vessel occlusion, hemodynamically significant stenosis, or other acute vascular abnormality. Electronically Signed   By: Jeannine Boga M.D.   On: 09/27/2020 22:37   US Carotid Bilateral (at [facility] and [facility] only)  Result Date: 09/27/2020 CLINICAL DATA:  Stroke-like symptoms EXAM: BILATERAL CAROTID DUPLEX ULTRASOUND TECHNIQUE: Pearline Cables scale imaging, color Doppler and duplex ultrasound were performed of bilateral carotid and vertebral arteries in the neck. COMPARISON:  None FINDINGS: Criteria: Quantification of carotid stenosis is based on velocity parameters that correlate the residual internal carotid diameter with NASCET-based stenosis levels, using the diameter of the distal internal carotid lumen as the denominator for stenosis measurement. The following velocity measurements were obtained: RIGHT ICA: 49/12 cm/sec CCA: 99/24 cm/sec SYSTOLIC ICA/CCA RATIO:  0.6 ECA: 56 cm/sec LEFT ICA: 68/23 cm/sec CCA: 26/83 cm/sec SYSTOLIC ICA/CCA RATIO:  1.2 ECA: 104 cm/sec RIGHT  CAROTID ARTERY: Preliminary  grayscale images demonstrates tortuosity of the carotid artery. Minimal atherosclerotic plaque is noted. Evaluation of the waveforms, velocities and flow velocity ratios however demonstrate no evidence of focal hemodynamically significant stenosis. RIGHT VERTEBRAL ARTERY:  Antegrade in nature. LEFT CAROTID ARTERY: Preliminary grayscale images demonstrate mild atherosclerotic plaque. Waveforms, velocities and flow velocity ratios however demonstrate no evidence of focal hemodynamically significant stenosis. LEFT VERTEBRAL ARTERY:  Antegrade in nature. IMPRESSION: Minimal atherosclerotic plaque bilaterally. No focal hemodynamically significant carotid stenosis is seen. Electronically Signed   By: Inez Catalina M.D.   On: 09/27/2020 20:15   DG Chest Port 1 View  Result Date: 09/27/2020 CLINICAL DATA:  Possible stroke EXAM: PORTABLE CHEST 1 VIEW COMPARISON:  08/27/2020 FINDINGS: Mildly diminished lung volumes. Borderline to mild cardiomegaly. No focal opacity, pleural effusion or pneumothorax IMPRESSION: No active disease. Borderline to mild cardiomegaly. Electronically Signed   By: Donavan Foil M.D.   On: 09/27/2020 19:21   ECHOCARDIOGRAM COMPLETE BUBBLE STUDY  Result Date: 09/28/2020    ECHOCARDIOGRAM REPORT   Patient Name:   [name] Date of Exam: 09/28/2020 Medical Rec #:  [id number]       Height:       63.0 in Accession #:    [id number]      Weight:       171.0 lb Date of Birth:  April 24, 1956       BSA:          1.809 m Patient Age:    65 years        BP:           154/74 mmHg Patient Gender: F               HR:           85 bpm. Exam Location:  [facility] Procedure: 2D Echo, Color Doppler, Cardiac Doppler and Saline Contrast Bubble            Study Indications:     I63.9 Stroke  History:         Patient has prior history of Echocardiogram examinations, most                  recent 07/18/2020. TIA; Risk Factors:Hypertension and Diabetes.                  Pt tested positive for COVID-19 on 09/27/2020.   Sonographer:     Charmayne Sheer RDCS (AE) Referring Phys:  [id number] [name] N COX Diagnosing Phys: Kate Sable MD  Sonographer Comments: Suboptimal apical window and no subcostal window. Image acquisition challenging due to patient body habitus and Image acquisition challenging due to respiratory motion. IMPRESSIONS  1. Left ventricular ejection fraction, by estimation, is 55 to 60%. The left ventricle has normal function. The left ventricle has no regional wall motion abnormalities. Left ventricular diastolic parameters are consistent with Grade I diastolic dysfunction (impaired relaxation).  2. Right ventricular systolic function is normal. The right ventricular size is normal.  3. The mitral valve is normal in structure. No evidence of mitral valve regurgitation.  4. The aortic valve is grossly normal. Aortic valve regurgitation is not visualized. FINDINGS  Left Ventricle: Left ventricular ejection fraction, by estimation, is 55 to 60%. The left ventricle has normal function. The left ventricle has no regional wall motion abnormalities. The left ventricular internal cavity size was normal in size. There is  no left ventricular hypertrophy. Left ventricular diastolic parameters are consistent with Grade I diastolic  dysfunction (impaired relaxation). Right Ventricle: The right ventricular size is normal. No increase in right ventricular wall thickness. Right ventricular systolic function is normal. Left Atrium: Left atrial size was normal in size. Right Atrium: Right atrial size was normal in size. Pericardium: There is no evidence of pericardial effusion. Mitral Valve: The mitral valve is normal in structure. No evidence of mitral valve regurgitation. MV peak gradient, 4.2 mmHg. The mean mitral valve gradient is 2.0 mmHg. Tricuspid Valve: The tricuspid valve is grossly normal. Tricuspid valve regurgitation is not demonstrated. Aortic Valve: The aortic valve is grossly normal. Aortic valve regurgitation is not  visualized. Aortic valve mean gradient measures 1.0 mmHg. Aortic valve peak gradient measures 2.8 mmHg. Aortic valve area, by VTI measures 4.68 cm. Pulmonic Valve: The pulmonic valve was not well visualized. Pulmonic valve regurgitation is not visualized. Aorta: The aortic root is normal in size and structure. IAS/Shunts: No atrial level shunt detected by color flow Doppler. Agitated saline contrast was given intravenously to evaluate for intracardiac shunting.  LEFT VENTRICLE PLAX 2D LVIDd:         4.20 cm  Diastology LVIDs:         3.10 cm  LV e' medial:    6.85 cm/s LV PW:         1.30 cm  LV E/e' medial:  6.1 LV IVS:        0.80 cm  LV e' lateral:   5.00 cm/s LVOT diam:     2.40 cm  LV E/e' lateral: 8.3 LV SV:         66 LV SV Index:   37 LVOT Area:     4.52 cm  LEFT ATRIUM           Index LA diam:      2.80 cm 1.55 cm/m LA Vol (A4C): 61.9 ml 34.22 ml/m  AORTIC VALVE                   PULMONIC VALVE AV Area (Vmax):    3.78 cm    PV Vmax:       1.05 m/s AV Area (Vmean):   4.07 cm    PV Vmean:      70.200 cm/s AV Area (VTI):     4.68 cm    PV VTI:        0.171 m AV Vmax:           84.30 cm/s  PV Peak grad:  4.4 mmHg AV Vmean:          55.400 cm/s PV Mean grad:  2.0 mmHg AV VTI:            0.141 m AV Peak Grad:      2.8 mmHg AV Mean Grad:      1.0 mmHg LVOT Vmax:         70.50 cm/s LVOT Vmean:        49.800 cm/s LVOT VTI:          0.146 m LVOT/AV VTI ratio: 1.04  AORTA Ao Root diam: 3.60 cm MITRAL VALVE MV Area (PHT): 8.82 cm    SHUNTS MV Area VTI:   5.24 cm    Systemic VTI:  0.15 m MV Peak grad:  4.2 mmHg    Systemic Diam: 2.40 cm MV Mean grad:  2.0 mmHg MV Vmax:       1.02 m/s MV Vmean:      61.5 cm/s MV Decel Time: 86 msec MV E velocity:  41.60 cm/s MV A velocity: 86.50 cm/s MV E/A ratio:  0.48 Kate Sable MD Electronically signed by Kate Sable MD Signature Date/Time: 09/28/2020/3:55:50 PM    Final     Cardiac Studies   Echocardiogram 1. Left ventricular ejection fraction, by  estimation, is 55 to 60%. The  left ventricle has normal function. The left ventricle has no regional  wall motion abnormalities. Left ventricular diastolic parameters are  consistent with Grade I diastolic  dysfunction (impaired relaxation).  2. Right ventricular systolic function is normal. The right ventricular  size is normal.  3. The mitral valve is normal in structure. No evidence of mitral valve  regurgitation.  4. The aortic valve is grossly normal. Aortic valve regurgitation is not  visualized.   Patient Profile     [name] is a 65 y.o. female with a hx of paroxysmal A. fib, TIA, nonobstructive CAD, stenosis, diabetes, frequent mechanical falls who is being seen today for the evaluation of weakness, COVID-19 testing was positive, orthostatic hypotension  Assessment & Plan    COVID-19 May be contributing to weakness, UTI Management per medicine team  Orthostatic hypotension Long history dating back several years, On last clinic visit was recommended to take at least midodrine 5 mg 3 times daily but was recommended at that time she would likely need 10 mg 3 times daily -For now would recommend we start midodrine 86m 3 times daily with close monitoring, retest orthostatics Suspect she will need to be titrated up to 10 mg 3 times daily  Paroxysmal atrial fibrillation Maintaining normal sinus rhythm,  Metoprolol lowered to 12.5 twice daily for orthostasis, This is what she was taking as an outpatient on my last clinic visit If we can stabilize her blood pressure, have her complete some rehab, could reevaluate the question of anticoagulation but currently is having frequent falls Notes indicating allergy to aspirin  History of sinus tachycardia We will monitor on low-dose metoprolol  Gastroparesis History of chronic nausea Difficult to maintain adequate hydration at times  Case discussed with hospitalist service Discussed Long history of orthostasis Total  encounter time more than 35 minutes Greater than 50% was spent in counseling and coordination of care with the patient   For questions or updates, please contact [place]Please consult [email or web address] for contact info under        Signed, TIda Rogue MD  09/29/2020, 1:58 PM

## 2020-09-29 NOTE — Progress Notes (Signed)
Inpatient Diabetes Program Recommendations  AACE/ADA: New Consensus Statement on Inpatient Glycemic Control (2015)  Target Ranges:  Prepandial:   less than 140 mg/dL      Peak postprandial:   less than 180 mg/dL (1-2 hours)      Critically ill patients:  140 - 180 mg/dL   Lab Results  Component Value Date   GLUCAP 171 (H) 09/29/2020   HGBA1C 9.1 (H) 09/28/2020    Review of Glycemic Control  Diabetes history: Type 2 Dm Outpatient Diabetes medications: Levemir 30 units BID Current orders for Inpatient glycemic control: Levemir 30 units QHS, Novolog 0-5 units QHS, Novolog 0-15 units TID  Inpatient Diabetes Program Recommendations:    Spoke with patient regarding outpatient diabetes management.  Reviewed patient's current A1c of 9.1% which is down from 11.6%. Verifies home medications, denies missing doses.   Explained what a A1c is and what it measures. Also reviewed goal A1c with patient, importance of good glucose control @ home, and blood sugar goals. Reviewed patho of DM, post prandial values, inpatient glucose, vascular changes and commorbidities.  Patient has a meter and supplies. Checks CBGs two times per day. Reviewed when to call MD and encouraged follow up.  Patient denies drinking sugary beverages. Briefly reviewed plate method and encouraged continued mindfulness of CHO intake.  Patient has no further questions at this time.   Thanks, Bronson Curb, MSN, RNC-OB Diabetes Coordinator [phone] (8a-5p)

## 2020-09-29 NOTE — TOC Progression Note (Signed)
Transition of Care [facility]) - Progression Note    Patient Details  Name: [name] MRN: [id number] Date of Birth: [birth year]  Transition of Care [facility]) CM/SW Contact  Shelbie Hutching, RN Phone Number: 09/29/2020, 3:18 PM  Clinical Narrative:    Patient requires assistance with transport home, patient and patient's husband were unable to get a ride- they do not have a car usually friends provide transport for them. [facility] will be picking patient up to take her home.  They should arrive in the net 15 min.   Expected Discharge Plan: [place] Barriers to Discharge: Barriers Resolved  Expected Discharge Plan and Services Expected Discharge Plan: [place]   Discharge Planning Services: CM Consult Post Acute Care Choice: [place] arrangements for the past 2 months: Mobile Home Expected Discharge Date: 09/29/20               DME Arranged: N/A DME Agency: NA       HH Arranged: [place] Work [place] Agency: [facility] Date [place] Agency Contacted: 09/29/20 Time [place]: [phone] Representative spoke with at [place]: [place] ([place]) Interventions    Readmission Risk Interventions Readmission Risk Prevention Plan 10/01/2019 07/18/2019 07/16/2019  Transportation Screening Complete Complete Complete  Medication Review Press photographer) Complete Complete Complete  PCP or Specialist appointment within 3-5 days of discharge Complete Complete -  [place] or Home Care Consult Complete Complete Complete  SW Recovery Care/Counseling Consult Complete Complete -  Palliative Care Screening Not Applicable Not Applicable Not [place] - Not Applicable Patient Refused  Some recent data might be hidden

## 2020-09-29 NOTE — Care Management Obs Status (Signed)
[facility] NOTIFICATION   Patient Details  Name: [name] MRN: [id number] Date of Birth: 1956/06/20   Medicare Observation Status Notification Given:  Yes    Shelbie Hutching, RN 09/29/2020, 9:47 AM

## 2020-09-30 ENCOUNTER — Ambulatory Visit: Payer: Medicare Other | Admitting: Primary Care

## 2020-10-02 LAB — METHYLMALONIC ACID, SERUM: Methylmalonic Acid, Quantitative: 279 nmol/L (ref 0–378)

## 2020-10-05 ENCOUNTER — Telehealth: Payer: Self-pay | Admitting: Primary Care

## 2020-10-05 NOTE — Telephone Encounter (Signed)
Can we make app in office 10 days after onset of symptoms?

## 2020-10-05 NOTE — Telephone Encounter (Signed)
Pt  Was seen in [facility] for stroke and Covid infection. Tested positive on 09/27/20 for Covid, asymptomatic.  She cancelled her Friday 10/07/20 appt and is wanting to know when she can be seen in office for her HFU.   Pt requests a Tuesday or Wednesday appt  Please advise, thanks.

## 2020-10-05 NOTE — Telephone Encounter (Signed)
Usually it is 21 days for anyone in the hospital but she was not in the hospital for covid, she was in for a strok. If she is asymptomatic I would say she can come in the office after 10 days as long as no part of her stay in the hospital was for covid treatment. If any of it was for covid treatment then she cannot come into the office for 21 days from diagnosis.

## 2020-10-05 NOTE — Telephone Encounter (Signed)
Please advise when we can see in office?

## 2020-10-05 NOTE — Telephone Encounter (Signed)
You have to double check with administration, but I believe we can see her 10 days after symptom onset from Covid. She needs to come in the office for this visit as she has canceled numerous other visits and or no showed.

## 2020-10-06 NOTE — Telephone Encounter (Signed)
When do you want me to make app?

## 2020-10-06 NOTE — Telephone Encounter (Signed)
Make the appointment for what ever makes sense with her symptoms as Larene Beach noted below.

## 2020-10-07 ENCOUNTER — Other Ambulatory Visit: Payer: Self-pay | Admitting: Pain Medicine

## 2020-10-07 ENCOUNTER — Other Ambulatory Visit: Payer: Self-pay | Admitting: Primary Care

## 2020-10-07 ENCOUNTER — Inpatient Hospital Stay: Payer: Medicare Other | Admitting: Primary Care

## 2020-10-07 DIAGNOSIS — E1165 Type 2 diabetes mellitus with hyperglycemia: Secondary | ICD-10-CM

## 2020-10-07 DIAGNOSIS — M797 Fibromyalgia: Secondary | ICD-10-CM

## 2020-10-07 DIAGNOSIS — G8929 Other chronic pain: Secondary | ICD-10-CM

## 2020-10-07 DIAGNOSIS — M7918 Myalgia, other site: Secondary | ICD-10-CM

## 2020-10-07 NOTE — Telephone Encounter (Signed)
Called patient and LVM to call back to schedule OV.

## 2020-10-07 NOTE — Telephone Encounter (Signed)
Can you call to make appointment

## 2020-10-08 ENCOUNTER — Other Ambulatory Visit: Payer: Self-pay

## 2020-10-08 ENCOUNTER — Emergency Department
Admission: EM | Admit: 2020-10-08 | Discharge: 2020-10-08 | Disposition: A | Discharge status: Home / Self Care | Payer: Medicare Other | Attending: Emergency Medicine | Admitting: Emergency Medicine

## 2020-10-08 DIAGNOSIS — Z79899 Other long term (current) drug therapy: Secondary | ICD-10-CM | POA: Insufficient documentation

## 2020-10-08 DIAGNOSIS — Z87891 Personal history of nicotine dependence: Secondary | ICD-10-CM | POA: Diagnosis not present

## 2020-10-08 DIAGNOSIS — U071 COVID-19: Secondary | ICD-10-CM | POA: Diagnosis not present

## 2020-10-08 DIAGNOSIS — N183 Chronic kidney disease, stage 3 unspecified: Secondary | ICD-10-CM | POA: Insufficient documentation

## 2020-10-08 DIAGNOSIS — Z8542 Personal history of malignant neoplasm of other parts of uterus: Secondary | ICD-10-CM | POA: Insufficient documentation

## 2020-10-08 DIAGNOSIS — E1165 Type 2 diabetes mellitus with hyperglycemia: Secondary | ICD-10-CM | POA: Diagnosis not present

## 2020-10-08 DIAGNOSIS — N390 Urinary tract infection, site not specified: Secondary | ICD-10-CM | POA: Diagnosis not present

## 2020-10-08 DIAGNOSIS — I1310 Hypertensive heart and chronic kidney disease without heart failure, with stage 1 through stage 4 chronic kidney disease, or unspecified chronic kidney disease: Secondary | ICD-10-CM | POA: Insufficient documentation

## 2020-10-08 DIAGNOSIS — R112 Nausea with vomiting, unspecified: Secondary | ICD-10-CM | POA: Diagnosis not present

## 2020-10-08 DIAGNOSIS — I2510 Atherosclerotic heart disease of native coronary artery without angina pectoris: Secondary | ICD-10-CM | POA: Diagnosis not present

## 2020-10-08 DIAGNOSIS — E039 Hypothyroidism, unspecified: Secondary | ICD-10-CM | POA: Diagnosis not present

## 2020-10-08 DIAGNOSIS — E1122 Type 2 diabetes mellitus with diabetic chronic kidney disease: Secondary | ICD-10-CM | POA: Insufficient documentation

## 2020-10-08 DIAGNOSIS — R0602 Shortness of breath: Secondary | ICD-10-CM | POA: Diagnosis present

## 2020-10-08 LAB — URINALYSIS, COMPLETE (UACMP) WITH MICROSCOPIC
Bilirubin Urine: NEGATIVE
Glucose, UA: >=500 mg/dL — AB
Ketones, ur: NEGATIVE mg/dL
Nitrite: NEGATIVE
Protein, ur: 100 mg/dL — AB
RBC / HPF: >50 RBC/hpf — ABNORMAL HIGH (ref 0–5)
Specific Gravity, Urine: 1.023 (ref 1.005–1.030)
WBC, UA: >50 WBC/hpf — ABNORMAL HIGH (ref 0–5)
pH: 5 (ref 5.0–8.0)

## 2020-10-08 LAB — CBG MONITORING, ED
Glucose-Capillary: 49 mg/dL — ABNORMAL LOW (ref 70–99)
Glucose-Capillary: 53 mg/dL — ABNORMAL LOW (ref 70–99)
Glucose-Capillary: 86 mg/dL (ref 70–99)

## 2020-10-08 LAB — COMPREHENSIVE METABOLIC PANEL WITH GFR
ALT: 23 U/L (ref 0–44)
AST: 37 U/L (ref 15–41)
Albumin: 3.7 g/dL (ref 3.5–5.0)
Alkaline Phosphatase: 137 U/L — ABNORMAL HIGH (ref 38–126)
Anion gap: 11 (ref 5–15)
BUN: 19 mg/dL (ref 8–23)
CO2: 23 mmol/L (ref 22–32)
Calcium: 9.4 mg/dL (ref 8.9–10.3)
Chloride: 102 mmol/L (ref 98–111)
Creatinine, Ser: 1.17 mg/dL — ABNORMAL HIGH (ref 0.44–1.00)
GFR, Estimated: 51 mL/min — ABNORMAL LOW
Glucose, Bld: 265 mg/dL — ABNORMAL HIGH (ref 70–99)
Potassium: 3.8 mmol/L (ref 3.5–5.1)
Sodium: 136 mmol/L (ref 135–145)
Total Bilirubin: 0.6 mg/dL (ref 0.3–1.2)
Total Protein: 7.8 g/dL (ref 6.5–8.1)

## 2020-10-08 LAB — CBC
HCT: 34.8 % — ABNORMAL LOW (ref 36.0–46.0)
Hemoglobin: 11 g/dL — ABNORMAL LOW (ref 12.0–15.0)
MCH: 26 pg (ref 26.0–34.0)
MCHC: 31.6 g/dL (ref 30.0–36.0)
MCV: 82.3 fL (ref 80.0–100.0)
Platelets: 170 10*3/uL (ref 150–400)
RBC: 4.23 MIL/uL (ref 3.87–5.11)
RDW: 15.3 % (ref 11.5–15.5)
WBC: 6.4 10*3/uL (ref 4.0–10.5)
nRBC: 0 % (ref 0.0–0.2)

## 2020-10-08 LAB — BETA-HYDROXYBUTYRIC ACID: Beta-Hydroxybutyric Acid: 0.12 mmol/L (ref 0.05–0.27)

## 2020-10-08 LAB — LIPASE, BLOOD: Lipase: 31 U/L (ref 11–51)

## 2020-10-08 MED ORDER — SODIUM CHLORIDE 0.9 % IV SOLN
1.0000 g | Freq: Once | INTRAVENOUS | Status: AC
  Administered 2020-10-08: 1 g via INTRAVENOUS
  Filled 2020-10-08: qty 10

## 2020-10-08 MED ORDER — CEPHALEXIN 500 MG PO CAPS: 500.0000 mg | ORAL_CAPSULE | Freq: Three times a day (TID) | ORAL | 0 refills | Status: DC

## 2020-10-08 MED ORDER — SODIUM CHLORIDE 0.9 % IV BOLUS
500.0000 mL | Freq: Once | INTRAVENOUS | Status: AC
  Administered 2020-10-08: 500 mL via INTRAVENOUS

## 2020-10-08 MED ORDER — OXYCODONE HCL 5 MG PO TABS
5.0000 mg | ORAL_TABLET | ORAL | Status: AC
  Administered 2020-10-08: 5 mg via ORAL
  Filled 2020-10-08: qty 1

## 2020-10-08 NOTE — Discharge Instructions (Signed)
Please try to stay hydrated, drink plenty of clear fluids and taking your regular medication.  Even if you do not feel like eating or drinking very much, you need to make sure you do not get dehydrated and make sure your body has enough you will to try and get better.  Follow-up with your regular doctor.  Return to the emergency department if you develop new or worsening symptoms that concern you.

## 2020-10-08 NOTE — ED Notes (Signed)
Pt continues to rest in bed, continues to eat PB and Graham crackers and drink her OJ. Repeat CBG 86 at this time. Pt in NAD, states she is feeling better since she ate.

## 2020-10-08 NOTE — ED Notes (Signed)
This RN attempted to call SW x 2 no answer, updated patient regarding attempts to find ride. Pt provided with 2 warm blankets and lights dimmed for comfort. Pt offered food and something to drink, pt refused at this time. Call bell within reach of patient. Pt denies further needs.

## 2020-10-08 NOTE — ED Notes (Signed)
This RN back to bedside, explained to patient still working on finding a ride home. Per [facility] wait on SW to arrive, this RN discussed with Agricultural consultant, [employer] will not transport home, pt is ambulatory, and lives in [place] and [place] on [place]. Currently discussing other options for transport home at this time.

## 2020-10-08 NOTE — ED Triage Notes (Signed)
Pt states has not had her insulin in over 3 days. Pt complains of generalized abd pain, diarrhea, vomiting and hyperglycemia, recently diagnosed with covid on 09/27/20.

## 2020-10-08 NOTE — ED Notes (Signed)
This RN to bedside, introduced self to patient. Pt alert and oriented, IVF completed. Pt states she needs a ride home. Pt states she and her husband do not have a car and her neighbors are unable to drive due to weather. Pt denies further needs at this time.

## 2020-10-08 NOTE — ED Notes (Addendum)
Pt requesting iv dilaudid. md notified. Pt states she was not able to tolerate po fluids. Cup that was given to pt looks like onl a quarter ounce consumed. Pt has approx 452m of ns left in bag to bolus. Per dr. fKarma Greaserwill discharge pt when ivf completed.

## 2020-10-08 NOTE — ED Notes (Signed)
Pt sipping on po fluids.

## 2020-10-08 NOTE — ED Notes (Signed)
UA collected at this time, pt continues to eat graham crackers and PB. IVF initiated per order. Pt's urine noted to be amber in color. EDP made aware.

## 2020-10-08 NOTE — ED Notes (Signed)
Report to [place], rn.

## 2020-10-08 NOTE — ED Notes (Signed)
Repeat CBG obtained by this RN, pt's CBG noted to be 42, rechecked by this RN, noted to be 49, pt given 4 oz of OJ to drink, will recheck in 15 mins. EDP Paduchowski made aware of patient's hypoglycemia at this time.

## 2020-10-08 NOTE — ED Provider Notes (Signed)
-----------------------------------------   10:59 AM on 10/08/2020 -----------------------------------------  Patient with mild tachycardia.  Given an additional 500 cc of fluid as we continue to await social work for transportation issues to get home.  Patient's blood glucose had noted to drop to 42, 49 on recheck.  Was given orange juice which she is tolerating well current blood glucose 86.  I was able to obtain a urine sample, greater than 50 red and white cells with rare bacteria.  We will treat with IV rocephin.   A urine culture has been added.  Patient still remained stable for discharge home, currently eating.   Harvest Dark, MD 10/08/20 1100

## 2020-10-08 NOTE — ED Provider Notes (Signed)
[facility] Emergency Department Provider Note  ____________________________________________   Event Date/Time   First MD Initiated Contact with Patient 10/08/20 0403     (approximate)  I have reviewed the triage vital signs and the nursing notes.   HISTORY  Chief Complaint Hyperglycemia    HPI [name] is a 65 y.o. female  with extensive PMH as listed below who presents for evaluation of possible hyperglycemia.  She was diagnosed with COVID-19 about 10 days ago and has had  persistent nausea and vomiting since that time.  She says that she feels like she cannot eat or drink very much time.  Nothing particular makes the symptoms better or worse and they are severe.  Of note, however, she did not want to come in tonight, but her husband insisted that she come in by EMS because he was concerned that her blood sugar would be very high and she might be in DKA.  She denies chest pain.  Her shortness of breath is no worse than it has been.  She is currently little bit nauseated but she has a history of prolonged QT so is not taking any antiemetics.        Past Medical History:  Diagnosis Date  . Acute encephalopathy 05/22/2018  . Allergy   . Anginal pain ([place])   . Anxiety   . Ascites   . C. difficile colitis 07/10/2015  . Cancer (HCC)    HX OF CANCER OF UTERUS   . Cirrhosis of liver not due to alcohol ([place]) 2016  . Degenerative disk disease   . Diverticulitis   . Gastroparesis   . GERD (gastroesophageal reflux disease)   . History of hiatal hernia   . Hypertension   . Hypothyroid   . Hypothyroidism due to amiodarone   . Ileus ([place]) 08/01/2015  . Intussusception intestine ([place]) 05/2015  . Orthostatic hypotension   . PAF (paroxysmal atrial fibrillation) ([place]) 03/2015   a. new onset 03/2015 in setting of intractable N/V; b. on Eliquis 5 mg bid; c. CHADSVASc 4 (DM, TIA x 2, female)  . Pancreatitis   . Pneumonia 11/14/2015  . Pneumonia 07/2020   . Right ureteral stone 07/14/2016  . Sepsis ([place]) 12/15/2017  . Sick sinus syndrome ([place])   . Stomach ulcer   . Stroke [facility])    with minimal left sided weakness  . Syncope 01/2015  . Syncope due to orthostatic hypotension 05/18/2015  . Tachyarrhythmia 01/10/2016  . TIA (transient ischemic attack) 02/2015  . Type 1 diabetes ([place])    on levemir  . UTERINE CANCER, HX OF 03/27/2007   Qualifier: Diagnosis of  By: Maxie Better FNP, Rosalita Levan   . UTI (urinary tract infection) 05/22/2018    Patient Active Problem List   Diagnosis Date Noted  . Weakness 09/28/2020  . COVID-19   . Stroke ([place]) 09/27/2020  . Atrial fibrillation with RVR ([place]) 08/27/2020  . Hypoglycemia 08/27/2020  . CKD (chronic kidney disease), stage III ([place]) 08/27/2020  . Shortness of breath 07/16/2020  . [place] intertrigo 07/16/2020  . Sepsis ([place]) 11/23/2019  . Acute pyelonephritis 09/30/2019  . Acute pain of left foot 08/18/2019  . Type II diabetes mellitus with renal manifestations ([place]) 08/13/2019  . Weakness of both lower extremities 07/31/2019  . Altered mental status 07/14/2019  . GAD (generalized anxiety disorder) 06/11/2019  . Poor memory 06/11/2019  . Constipation 05/25/2019  . Acute medial meniscus tear of left knee 05/25/2019  . AKI (acute kidney  injury) ([place]) 05/16/2019  . Closed fracture of left distal fibula 05/16/2019  . Sinus tachycardia 05/05/2019  . Congestive dilated cardiomyopathy ([place]) 04/14/2019  . CAD (coronary artery disease) 04/10/2019  . Closed fracture of lateral malleolus 04/10/2019  . Lumbar facet syndrome (Bilateral) (R>L) 03/09/2019  . Long term (current) use of anticoagulants 03/09/2019  . Unspecified cirrhosis of liver ([place]) 02/11/2019  . Hypoalbuminemia 02/11/2019  . Edema due to hypoalbuminemia 02/11/2019  . Lower extremity edema 01/26/2019  . Prolonged QT interval 12/17/2018  . Hypomagnesemia 12/17/2018  . Uncontrolled type 2 diabetes mellitus ([place]) 12/17/2018  . H/O TIA  (transient ischemic attack) and stroke 12/17/2018  . Personal history of surgery to heart and great vessels, presenting hazards to health 12/17/2018  . DDD (degenerative disc disease), lumbar 12/17/2018  . Lumbar intervertebral disc displacement 12/17/2018  . Chronic musculoskeletal pain 12/17/2018  . Chronic generalized pain 12/17/2018  . Hyponatremia 12/17/2018  . Fibromyalgia syndrome 12/17/2018  . Other intervertebral disc displacement, lumbar region 12/17/2018  . Vitamin D deficiency 11/26/2018  . Elevated C-reactive protein (CRP) 11/26/2018  . Elevated sed rate 11/26/2018  . Chronic low back pain (Primary area of Pain) (Bilateral) (L>R) w/ sciatica (Bilateral) 11/25/2018  . Chronic lower extremity pain (Secondary Area of Pain) (Bilateral) (L>R) 11/25/2018  . Chronic neck pain 11/25/2018  . Pharmacologic therapy 11/25/2018  . Disorder of skeletal system 11/25/2018  . Problems influencing health status 11/25/2018  . Renal mass 10/06/2018  . Chronic intermittent abdominal pain 09/25/2018  . Unspecified abdominal pain 09/25/2018  . Frequent falls 09/12/2018  . Orthostatic hypotension 08/18/2018  . Normochromic normocytic anemia 08/18/2018  . Acute renal failure superimposed on stage 3a chronic kidney disease ([place]) 08/18/2018  . History of hypothyroidism 08/14/2018  . Medical non-compliance 08/14/2018  . Diabetic ketoacidosis ([place]) 05/22/2018  . Urinary tract infection, acute 05/22/2018  . NSTEMI (non-ST elevated myocardial infarction) ([place]) 01/11/2016  . Tachy-brady syndrome ([place]) 12/28/2015  . PAF (paroxysmal atrial fibrillation) ([place]) 11/24/2015  . Type 2 diabetes mellitus with neurologic complication ([place]) 62/13/0865  . S/P cholecystectomy 11/23/2015  . Narcotic abuse ([place]) 11/22/2015  . Chronic superficial gastritis without bleeding 11/22/2015  . History of Clostridium difficile 11/22/2015  . History of TIA (transient ischemic attack) 11/22/2015  . Mixed hyperlipidemia  11/22/2015  . Diverticulosis 11/22/2015  . History of uterine cancer 11/22/2015  . Hypothyroidism, unspecified 11/22/2015  . Intractable cyclical vomiting with nausea 11/22/2015  . Community acquired pneumonia of left lower lobe of lung 11/14/2015  . Narcotic withdrawal ([place]) 11/11/2015  . Hyperlipidemia 08/31/2015  . Hypotension 08/06/2015  . Cryptogenic cirrhosis ([place]) 07/10/2015  . Atrial fibrillation ([place]) 07/10/2015  . Malnutrition of moderate degree 07/04/2015  . Symptomatic bradycardia 05/27/2015  . Chronic anemia 05/19/2015  . Thrombocytopenia ([place]) 05/19/2015  . Hypokalemia 04/06/2015  . Hyperlipidemia with target LDL less than 100 04/06/2015  . Syncope 03/11/2015  . CVA (cerebral vascular accident) ([place]) 02/15/2015  . Essential hypertension 01/12/2015  . Chronically on opiate therapy 01/12/2015  . Cirrhosis of liver not due to alcohol ([place]) 2016  . S/P Nissen fundoplication (without gastrostomy tube) procedure 05/11/2014  . Dysphagia 04/19/2014  . DEPRESSION/ANXIETY 06/27/2007  . Myofascial pain syndrome 06/27/2007  . Chronic pain syndrome 03/28/2007  . GERD 03/27/2007  . Diverticulosis of large intestine without perforation or abscess without bleeding 03/27/2007  . Proteinuria 03/27/2007    Past Surgical History:  Procedure Laterality Date  . ABDOMINAL HYSTERECTOMY    . CARDIAC CATHETERIZATION N/A 01/12/2016  Procedure: Left Heart Cath and Coronary Angiography;  Surgeon: Wellington Hampshire, MD;  Location: [facility];  Service: Cardiovascular;  Laterality: N/A;  . CHOLECYSTECTOMY    . CYSTOSCOPY W/ RETROGRADES Bilateral 12/11/2019   Procedure: CYSTOSCOPY WITH RETROGRADE PYELOGRAM;  Surgeon: Billey Co, MD;  Location: [facility];  Service: Urology;  Laterality: Bilateral;  . CYSTOSCOPY WITH BIOPSY N/A 12/11/2019   Procedure: CYSTOSCOPY WITH BLADDER BIOPSY;  Surgeon: Billey Co, MD;  Location: [facility];  Service: Urology;  Laterality: N/A;  . CYSTOSCOPY  WITH FULGERATION N/A 12/11/2019   Procedure: CYSTOSCOPY WITH FULGERATION;  Surgeon: Billey Co, MD;  Location: [facility];  Service: Urology;  Laterality: N/A;  . CYSTOSCOPY WITH STENT PLACEMENT Right 12/11/2019   Procedure: CYSTOSCOPY WITH STENT PLACEMENT;  Surgeon: Billey Co, MD;  Location: [facility];  Service: Urology;  Laterality: Right;  . CYSTOSCOPY WITH URETHRAL DILATATION Right 12/11/2019   Procedure: CYSTOSCOPY WITH URETERAL DILATATION;  Surgeon: Billey Co, MD;  Location: [facility];  Service: Urology;  Laterality: Right;  . CYSTOSCOPY/URETEROSCOPY/HOLMIUM LASER Right 07/14/2016   Procedure: CYSTOSCOPY/URETEROSCOPY/HOLMIUM LASER;  Surgeon: Alexis Frock, MD;  Location: [facility];  Service: Urology;  Laterality: Right;  . ESOPHAGOGASTRODUODENOSCOPY N/A 04/04/2015   Procedure: ESOPHAGOGASTRODUODENOSCOPY (EGD);  Surgeon: Hulen Luster, MD;  Location: [facility] ENDOSCOPY;  Service: Endoscopy;  Laterality: N/A;  . ESOPHAGOGASTRODUODENOSCOPY N/A 12/28/2017   Procedure: ESOPHAGOGASTRODUODENOSCOPY (EGD);  Surgeon: Lin Landsman, MD;  Location: [facility] ENDOSCOPY;  Service: Gastroenterology;  Laterality: N/A;  . ESOPHAGOGASTRODUODENOSCOPY (EGD) WITH PROPOFOL N/A 01/18/2016   Procedure: ESOPHAGOGASTRODUODENOSCOPY (EGD) WITH PROPOFOL;  Surgeon: Lucilla Lame, MD;  Location: [facility] ENDOSCOPY;  Service: Endoscopy;  Laterality: N/A;  . FLEXIBLE SIGMOIDOSCOPY N/A 01/18/2016   Procedure: FLEXIBLE SIGMOIDOSCOPY;  Surgeon: Lucilla Lame, MD;  Location: [facility] ENDOSCOPY;  Service: Endoscopy;  Laterality: N/A;  . HERNIA REPAIR    . URETEROSCOPY Right 12/11/2019   Procedure: Christen Butter;  Surgeon: Billey Co, MD;  Location: [facility] ORS;  Service: Urology;  Laterality: Right;    Prior to Admission medications   Medication Sig Start Date End Date Taking? Authorizing Provider  albuterol (VENTOLIN HFA) 108 (90 Base) MCG/ACT inhaler Inhale 2 puffs into the lungs every 4 (four) hours as needed for wheezing or  shortness of breath. 07/18/20   Elodia Florence., MD  atorvastatin (LIPITOR) 40 MG tablet Take 40 mg by mouth at bedtime.    [provider]  cyclobenzaprine (FLEXERIL) 10 MG tablet Take 1 tablet (10 mg total) by mouth 3 (three) times daily as needed for muscle spasms. Must last 30 days. Patient taking differently: Take 10 mg by mouth 3 (three) times daily. Must last 30 days. 07/07/20 10/05/20  Milinda Pointer, MD  glucose blood (ACCU-CHEK AVIVA PLUS) test strip Use as instructed to test blood sugar up to 4 times daily 10/09/19   [name], NP  LEVEMIR FLEXTOUCH 100 UNIT/ML FlexPen INJECT 30 UNITS SUBCUTANEOUSLY EVERY MORNING AND AT BEDTIME 10/07/20   [name], NP  Melatonin 10 MG CAPS Take 1 tablet by mouth at bedtime.    [provider]  metoprolol tartrate (LOPRESSOR) 25 MG tablet Take 0.5 tablets (12.5 mg total) by mouth 2 (two) times daily. This is a decrease from your previous 25 mg twice daily. 09/29/20   Enzo Bi, MD  midodrine (PROAMATINE) 5 MG tablet Take 1 tablet (5 mg total) by mouth 3 (three) times daily with meals. To help prevent blood pressure dropping when you  stand. 09/29/20 12/28/20  Enzo Bi, MD  nitroGLYCERIN (NITROSTAT) 0.3 MG SL tablet Place 1 tablet (0.3 mg total) under the tongue every 5 (five) minutes as needed for chest pain. 07/24/19 07/23/20  Nena Polio, MD  oxyCODONE (OXY IR/ROXICODONE) 5 MG immediate release tablet Take 1 tablet (5 mg total) by mouth daily for 7 days. Max: 1/day. Must last 7 days. 09/14/20 09/21/20  Milinda Pointer, MD  pantoprazole (PROTONIX) 40 MG tablet Take 1 tablet (40 mg total) by mouth daily. 07/18/20 08/17/20  Elodia Florence., MD  sertraline (ZOLOFT) 50 MG tablet TAKE 1 TABLET BY MOUTH DAILY Patient taking differently: Take 50 mg by mouth daily. 09/12/20   [name], NP  Spacer/Aero-Hold Chamber Bags MISC Use spacer with inhaler when you have shortness of breath 07/18/20   Elodia Florence., MD  TECHLITE PEN NEEDLES 32G X 6 MM MISC USE TWICE DAILY AS DIRECTED 09/12/20   [name], NP    Allergies Aspirin, Codeine sulfate, Erythromycin, Prednisone, Rosiglitazone maleate, and Tetanus-diphtheria toxoids td  Family History  Problem Relation Age of Onset  . Hypertension Mother   . CAD Sister   . Heart attack Sister        Deceased Nov 14, 2014  . CAD Brother     Social History Social History   Tobacco Use  . Smoking status: Former Smoker    Types: Cigarettes  . Smokeless tobacco: Never Used  . Tobacco comment: 25 years ago and only smoked occasionally  Vaping Use  . Vaping Use: Never used  Substance Use Topics  . Alcohol use: No  . Drug use: No    Review of Systems Constitutional: General malaise and fatigue.  No fever/chills Eyes: No visual changes. ENT: Mild sore throat. Cardiovascular: Denies chest pain. Respiratory: Positive for shortness of breath over the last 10+ days. Gastrointestinal: No abdominal pain.  Positive for nausea and vomiting. Genitourinary: Negative for dysuria. Musculoskeletal: Negative for neck pain.  Negative for back pain. Integumentary: Negative for rash. Neurological: Negative for headaches, focal weakness or numbness.   ____________________________________________   PHYSICAL EXAM:  VITAL SIGNS: ED Triage Vitals  Enc Vitals Group     BP 10/08/20 0318 (!) 162/80     Pulse Rate 10/08/20 0319 (!) 119     Resp 10/08/20 0318 18     Temp 10/08/20 0321 98.4 F (36.9 C)     Temp Source 10/08/20 0318 Oral     SpO2 10/08/20 0319 99 %     Weight 10/08/20 0319 77 kg (169 lb 12.1 oz)     Height 10/08/20 0319 1.6 m (5' 3" )     Head Circumference --      Peak Flow --      Pain Score 10/08/20 0318 8     Pain Loc --      Pain Edu? --      Excl. in [place]? --     Constitutional: Alert and oriented.   Eyes: Conjunctivae are normal.  Head: Atraumatic. Nose: No congestion/rhinnorhea. Mouth/Throat: Patient is wearing a  mask. Neck: No stridor.  No meningeal signs.   Cardiovascular: Mild tachycardia, regular rhythm. Good peripheral circulation. Respiratory: Normal respiratory effort.  No retractions. Gastrointestinal: Soft and nondistended.  Mild generalized tenderness palpation throughout without any localized tenderness. Musculoskeletal: No lower extremity tenderness nor edema. No gross deformities of extremities. Neurologic:  Normal speech and language. No gross focal neurologic deficits are appreciated.  Skin:  Skin is warm, dry and  intact. Psychiatric: Mood and affect are normal. Speech and behavior are normal.  ____________________________________________   LABS (all labs ordered are listed, but only abnormal results are displayed)  Labs Reviewed  CBC - Abnormal; Notable for the following components:      Result Value   Hemoglobin 11.0 (*)    HCT 34.8 (*)    All other components within normal limits  COMPREHENSIVE METABOLIC PANEL - Abnormal; Notable for the following components:   Glucose, Bld 265 (*)    Creatinine, Ser 1.17 (*)    Alkaline Phosphatase 137 (*)    GFR, Estimated 51 (*)    All other components within normal limits  LIPASE, BLOOD  URINALYSIS, COMPLETE (UACMP) WITH MICROSCOPIC  BETA-HYDROXYBUTYRIC ACID  CBG MONITORING, ED   ____________________________________________  EKG  ED ECG REPORT I, Hinda Kehr, the attending physician, personally viewed and interpreted this ECG.  Date: 10/08/2020 EKG Time: 3:22 AM Rate: 114 Rhythm: Sinus tachycardia QRS Axis: normal Intervals: Prolonged QTC at 500 ms ST/T Wave abnormalities: Non-specific ST segment / T-wave changes, but no clear evidence of acute ischemia. Narrative Interpretation: no definitive evidence of acute ischemia; does not meet STEMI criteria.   ____________________________________________  RADIOLOGY I, Hinda Kehr, personally viewed and evaluated these images (plain radiographs) as part of my medical decision  making, as well as reviewing the written report by the radiologist.  ED MD interpretation: No indication for emergent imaging  Official radiology report(s): No results found.  ____________________________________________   PROCEDURES   Procedure(s) performed (including Critical Care):  .1-3 Lead EKG Interpretation Performed by: Hinda Kehr, MD Authorized by: Hinda Kehr, MD     Interpretation: abnormal     ECG rate:  112   ECG rate assessment: tachycardic     Rhythm: sinus tachycardia     Ectopy: none     Conduction: normal       ____________________________________________   INITIAL IMPRESSION / MDM / ASSESSMENT AND PLAN / ED COURSE  As part of my medical decision making, I reviewed the following data within the [place] [facility] notes reviewed and incorporated, Labs reviewed , EKG interpreted , Old chart reviewed and Notes from prior ED visits   Differential diagnosis includes, but is not limited to, COVID-19, DKA, HHS, other nonspecific viral syndrome, biliary colic, acute intra-abdominal infection.  The patient is on the cardiac monitor to evaluate for evidence of arrhythmia and/or significant heart rate changes.  Vital signs are reassuring other than mild tachycardia which could be due to the COVID or could be due to volume depletion.  No respiratory distress, no hypoxemia on room air, no tachypnea.  She has had persistent symptoms for about 10 days and her husband wanted her to be checked out even though she was not convinced.  Obviously she does not feel well but she is not in acute distress and has no evidence of an acute or emergent medical condition tonight.  Her labs notable for an essentially normal CBC and comprehensive metabolic panel with just a slight elevation of her creatinine but a normal anion gap of 11.  I initially ordered beta hydroxybutyric acid but given her normal anion gap I do not think it is indicative of DKA or other  complication.  She is able to tolerate oral fluids even though not a large amount in any one time.  Given her prolonged QTC interval, I am uncomfortable prescribing or administering an antiemetic given that essentially all of them can further prolong QTC.  I  see no evidence that she has been prescribed Zofran or similar medication in the past so we will hold off for now.  Patient understands and agrees with the plan.    Clinical Course as of 10/08/20 0606  Sat Oct 08, 2020  3532 Of note, I ordered oxycodone 5 mg by mouth because she has been prescribed that by her pain clinic and because she has been here for a while she has not been able to have her pain medication. [CF]    Clinical Course User Index [CF] Hinda Kehr, MD     ____________________________________________  FINAL CLINICAL IMPRESSION(S) / ED DIAGNOSES  Final diagnoses:  Nausea and vomiting, intractability of vomiting not specified, unspecified vomiting type  COVID-19     MEDICATIONS GIVEN DURING THIS VISIT:  Medications  oxyCODONE (Oxy IR/ROXICODONE) immediate release tablet 5 mg (has no administration in time range)  sodium chloride 0.9 % bolus 500 mL (500 mLs Intravenous New Bag/Given 10/08/20 0444)     ED Discharge Orders    None      *Please note:  SHIRLEYANN MONTERO was evaluated in Emergency Department on 10/08/2020 for the symptoms described in the history of present illness. She was evaluated in the context of the global COVID-19 pandemic, which necessitated consideration that the patient might be at risk for infection with the SARS-CoV-2 virus that causes COVID-19. Institutional protocols and algorithms that pertain to the evaluation of patients at risk for COVID-19 are in a state of rapid change based on information released by regulatory bodies including the CDC and federal and state organizations. These policies and algorithms were followed during the patient's care in the ED.  Some ED evaluations and  interventions may be delayed as a result of limited staffing during and after the pandemic.*  Note:  This document was prepared using Dragon voice recognition software and may include unintentional dictation errors.   Hinda Kehr, MD 10/08/20 [phone]

## 2020-10-08 NOTE — ED Notes (Signed)
Repeat CBG after 4oz OJ 53, pt provided with graham crackers and peanut butter and 8 oz OJ at this time. Pt tolerating PO well. Pt currently sitting up in bed eating at this time. Will recheck CBG.

## 2020-10-09 LAB — URINE CULTURE

## 2020-10-10 ENCOUNTER — Telehealth: Payer: Medicare Other | Admitting: Family

## 2020-10-10 ENCOUNTER — Encounter: Payer: Self-pay | Admitting: Pain Medicine

## 2020-10-10 ENCOUNTER — Other Ambulatory Visit: Payer: Self-pay

## 2020-10-10 ENCOUNTER — Ambulatory Visit: Payer: Medicare Other | Attending: Pain Medicine | Admitting: Pain Medicine

## 2020-10-10 DIAGNOSIS — Z79899 Other long term (current) drug therapy: Secondary | ICD-10-CM

## 2020-10-10 DIAGNOSIS — M5442 Lumbago with sciatica, left side: Secondary | ICD-10-CM | POA: Diagnosis not present

## 2020-10-10 DIAGNOSIS — M79604 Pain in right leg: Secondary | ICD-10-CM | POA: Diagnosis not present

## 2020-10-10 DIAGNOSIS — M47816 Spondylosis without myelopathy or radiculopathy, lumbar region: Secondary | ICD-10-CM

## 2020-10-10 DIAGNOSIS — M5441 Lumbago with sciatica, right side: Secondary | ICD-10-CM

## 2020-10-10 DIAGNOSIS — G8929 Other chronic pain: Secondary | ICD-10-CM

## 2020-10-10 DIAGNOSIS — M79605 Pain in left leg: Secondary | ICD-10-CM

## 2020-10-10 DIAGNOSIS — G894 Chronic pain syndrome: Secondary | ICD-10-CM | POA: Diagnosis not present

## 2020-10-10 DIAGNOSIS — M797 Fibromyalgia: Secondary | ICD-10-CM

## 2020-10-10 DIAGNOSIS — F1120 Opioid dependence, uncomplicated: Secondary | ICD-10-CM

## 2020-10-10 LAB — CBG MONITORING, ED: Glucose-Capillary: 42 mg/dL — CL (ref 70–99)

## 2020-10-10 MED ORDER — OXYCODONE HCL 5 MG PO TABS: 5.0000 mg | ORAL_TABLET | Freq: Four times a day (QID) | ORAL | 0 refills | Status: DC | PRN

## 2020-10-10 NOTE — Telephone Encounter (Signed)
Called patient to rsc. LVM to call back. Letter sent.

## 2020-10-10 NOTE — Progress Notes (Deleted)
Office Visit    Patient Name: [name] Date of Encounter: 10/10/2020  Primary Care Provider:  [name], NP Primary Cardiologist:  [name], MD Electrophysiologist:  None   Chief Complaint    [name] is a 65 y.o. female with a hx of ***paroxysmal atrial fibrillation, nonobstructive coronary artery disease, orthostatic hypotension, stress-induced cardiomyopathy with subsequent recovery of LVEF, pancreatitis, uterine cancer, gastroparesis, diverticulosis, hypothyroidism, poorly controlled diabetes, alcohol use, cirrhosis presents today for hospital follow-up  Past Medical History    Past Medical History:  Diagnosis Date  . Acute encephalopathy 05/22/2018  . Allergy   . Anginal pain ([place])   . Anxiety   . Ascites   . C. difficile colitis 07/10/2015  . Cancer (HCC)    HX OF CANCER OF UTERUS   . Cirrhosis of liver not due to alcohol ([place]) 2016  . Degenerative disk disease   . Diverticulitis   . Gastroparesis   . GERD (gastroesophageal reflux disease)   . History of hiatal hernia   . Hypertension   . Hypothyroid   . Hypothyroidism due to amiodarone   . Ileus ([place]) 08/01/2015  . Intussusception intestine ([place]) 05/2015  . Orthostatic hypotension   . PAF (paroxysmal atrial fibrillation) ([place]) 03/2015   a. new onset 03/2015 in setting of intractable N/V; b. on Eliquis 5 mg bid; c. CHADSVASc 4 (DM, TIA x 2, female)  . Pancreatitis   . Pneumonia 11/14/2015  . Pneumonia 07/2020  . Right ureteral stone 07/14/2016  . Sepsis ([place]) 12/15/2017  . Sick sinus syndrome ([place])   . Stomach ulcer   . Stroke [facility])    with minimal left sided weakness  . Syncope 01/2015  . Syncope due to orthostatic hypotension 05/18/2015  . Tachyarrhythmia 01/10/2016  . TIA (transient ischemic attack) 02/2015  . Type 1 diabetes ([place])    on levemir  . UTERINE CANCER, HX OF 03/27/2007   Qualifier: Diagnosis of  By: Maxie Better FNP, Rosalita Levan   . UTI (urinary tract infection) 05/22/2018    Past Surgical History:  Procedure Laterality Date  . ABDOMINAL HYSTERECTOMY    . CARDIAC CATHETERIZATION N/A 01/12/2016   Procedure: Left Heart Cath and Coronary Angiography;  Surgeon: Wellington Hampshire, MD;  Location: [facility];  Service: Cardiovascular;  Laterality: N/A;  . CHOLECYSTECTOMY    . CYSTOSCOPY W/ RETROGRADES Bilateral 12/11/2019   Procedure: CYSTOSCOPY WITH RETROGRADE PYELOGRAM;  Surgeon: Billey Co, MD;  Location: [facility];  Service: Urology;  Laterality: Bilateral;  . CYSTOSCOPY WITH BIOPSY N/A 12/11/2019   Procedure: CYSTOSCOPY WITH BLADDER BIOPSY;  Surgeon: Billey Co, MD;  Location: [facility];  Service: Urology;  Laterality: N/A;  . CYSTOSCOPY WITH FULGERATION N/A 12/11/2019   Procedure: CYSTOSCOPY WITH FULGERATION;  Surgeon: Billey Co, MD;  Location: [facility];  Service: Urology;  Laterality: N/A;  . CYSTOSCOPY WITH STENT PLACEMENT Right 12/11/2019   Procedure: CYSTOSCOPY WITH STENT PLACEMENT;  Surgeon: Billey Co, MD;  Location: [facility];  Service: Urology;  Laterality: Right;  . CYSTOSCOPY WITH URETHRAL DILATATION Right 12/11/2019   Procedure: CYSTOSCOPY WITH URETERAL DILATATION;  Surgeon: Billey Co, MD;  Location: [facility];  Service: Urology;  Laterality: Right;  . CYSTOSCOPY/URETEROSCOPY/HOLMIUM LASER Right 07/14/2016   Procedure: CYSTOSCOPY/URETEROSCOPY/HOLMIUM LASER;  Surgeon: Alexis Frock, MD;  Location: [facility];  Service: Urology;  Laterality: Right;  . ESOPHAGOGASTRODUODENOSCOPY N/A 04/04/2015   Procedure: ESOPHAGOGASTRODUODENOSCOPY (EGD);  Surgeon: Hulen Luster, MD;  Location: [facility]  ENDOSCOPY;  Service: Endoscopy;  Laterality: N/A;  . ESOPHAGOGASTRODUODENOSCOPY N/A 12/28/2017   Procedure: ESOPHAGOGASTRODUODENOSCOPY (EGD);  Surgeon: Lin Landsman, MD;  Location: [facility] ENDOSCOPY;  Service: Gastroenterology;  Laterality: N/A;  . ESOPHAGOGASTRODUODENOSCOPY (EGD) WITH PROPOFOL N/A 01/18/2016   Procedure: ESOPHAGOGASTRODUODENOSCOPY (EGD)  WITH PROPOFOL;  Surgeon: Lucilla Lame, MD;  Location: [facility] ENDOSCOPY;  Service: Endoscopy;  Laterality: N/A;  . FLEXIBLE SIGMOIDOSCOPY N/A 01/18/2016   Procedure: FLEXIBLE SIGMOIDOSCOPY;  Surgeon: Lucilla Lame, MD;  Location: [facility] ENDOSCOPY;  Service: Endoscopy;  Laterality: N/A;  . HERNIA REPAIR    . URETEROSCOPY Right 12/11/2019   Procedure: Christen Butter;  Surgeon: Billey Co, MD;  Location: [facility] ORS;  Service: Urology;  Laterality: Right;    Allergies  Allergies  Allergen Reactions  . Aspirin Rash  . Codeine Sulfate Rash  . Erythromycin Rash    Reaction:  Fever   . Prednisone Swelling  . Rosiglitazone Maleate Swelling  . Tetanus-Diphtheria Toxoids Td Rash and Other (See Comments)    Reaction:  Fever     History of Present Illness    ROSSETTA [name] is a 65 y.o. female with a hx of *** last seen while hospitalized.  Known history of paroxysmal atrial fibrillation though not on anticoagulation due to frequent falls and bleeding risk.  Prior catheterization 2017 showed minimal luminal irregularities without evidence of obstructive CAD.  She had mild to moderately reduced LVEF 40% with wall motion abnormalities suggestive of stress-induced cardiomyopathy.  Since that time she has demonstrated recovery of LVEF.  Echo 07/28/2020 LVEF 60 to 65%, no wall motion abnormalities, moderate LVH, LV diastolic function unable to be evaluated.  She was seen via telemedicine 09/27/2020 by Elenor Quinones, PA.  She and family members noted 3 to 4 days prior she had had a fall.  Fall was associated with racing heart rate, chest pain, dizziness, loss of consciousness.  She hit her head and since that time has had blurry vision, confusion, left-sided weakness.  She was recommended to proceed to the emergency department for evaluation for concern of stroke.  Hospitalized 09/27/2020-.  MRI brain showed no new stroke though does have remote lacunar stroke most notable in pons.  MR angio head and carotid  duplex negative for LVO or significant stenosis.  Neuro was consulted and recommended to continue aspirin 81 mg daily.  Echo 09/28/2020 LVEF 55 to 29%, grade 1 diastolic function, no wall motion abnormalities, RV normal size and function, no significant valvular abnormalities.  Her metoprolol was decreased to 12.5 mg twice daily due to hypotension.  Midodrine 5 mg 3 times daily was prescribed at discharge.  She was also diagnosed with COVID-19 though chest x-ray showed no active disease.  She was recommended follow-up with urology due to concern for bladder cancer due to pyuria, hematuria, chronic dysuria.  ***   EKGs/Labs/Other Studies Reviewed:   The following studies were reviewed today:  Echo 09/28/20  1. Left ventricular ejection fraction, by estimation, is 55 to 60%. The  left ventricle has normal function. The left ventricle has no regional  wall motion abnormalities. Left ventricular diastolic parameters are  consistent with Grade I diastolic  dysfunction (impaired relaxation).   2. Right ventricular systolic function is normal. The right ventricular  size is normal.   3. The mitral valve is normal in structure. No evidence of mitral valve  regurgitation.   4. The aortic valve is grossly normal. Aortic valve regurgitation is not  visualized.    07/2020 Echocardiogram  1. Left  ventricular ejection fraction, by estimation, is 60 to 65%. The  left ventricle has normal function. The left ventricle has no regional  wall motion abnormalities. There is moderate left ventricular hypertrophy.  Left ventricular diastolic function   could not be evaluated.   2. Right ventricular systolic function is normal. The right ventricular  size is normal. Tricuspid regurgitation signal is inadequate for assessing  PA pressure.   3. The mitral valve is normal in structure. No evidence of mitral valve  regurgitation. No evidence of mitral stenosis.   4. The aortic valve is normal in structure. Aortic  valve regurgitation is  not visualized. Mild aortic valve sclerosis is present, with no evidence  of aortic valve stenosis.   5. No apical window and no subcostal window. Limited images.   01/12/2016 Left heart catheterization and coronary angiography  There is mild to moderate left ventricular systolic dysfunction. 1. Minimal luminal irregularities with no evidence of obstructive coronary artery disease. 2. Mildly to Moderately reduced LV systolic function with an ejection fraction of 40% with wall motion abnormality suggestive of stress-induced cardiomyopathy. 3. Mildly elevated left ventricular end-diastolic pressure. Recommendations: The patient has no evidence of obstructive coronary artery disease. Her presentation and cardiac catheterization is consistent with stress-induced cardiomyopathy. Continue treatment with metoprolol. Resume anticoagulation with Eliquis tomorrow if no bleeding complications. Treat underlying medical conditions.   EKG:  EKG is  ordered today.  The ekg ordered today demonstrates ***  Recent Labs: 08/27/2020: B Natriuretic Peptide 162.7 09/27/2020: TSH 3.869 09/29/2020: Magnesium 1.9 10/08/2020: ALT 23; BUN 19; Creatinine, Ser 1.17; Hemoglobin 11.0; Platelets 170; Potassium 3.8; Sodium 136  Recent Lipid Panel    Component Value Date/Time   CHOL 143 09/28/2020 0647   CHOL 182 11/12/2011 0325   TRIG 111 09/28/2020 0647   TRIG 149 11/12/2011 0325   HDL 45 09/28/2020 0647   HDL 23 (L) 11/12/2011 0325   CHOLHDL 3.2 09/28/2020 0647   VLDL 22 09/28/2020 0647   VLDL 30 11/12/2011 0325   LDLCALC 76 09/28/2020 0647   LDLCALC 129 (H) 11/12/2011 0325   Home Medications   No outpatient medications have been marked as taking for the 10/10/20 encounter (Appointment) with Loel Dubonnet, NP.     Review of Systems   ***   ROS All other systems reviewed and are otherwise negative except as noted above.  Physical Exam    VS:  There were no vitals taken for this  visit. , BMI There is no height or weight on file to calculate BMI.  Wt Readings from Last 3 Encounters:  10/08/20 169 lb 12.1 oz (77 kg)  09/27/20 171 lb (77.6 kg)  09/27/20 171 lb (77.6 kg)     GEN: Well nourished, well developed, in no acute distress. HEENT: normal. Neck: Supple, no JVD, carotid bruits, or masses. Cardiac: ***RRR, no murmurs, rubs, or gallops. No clubbing, cyanosis, edema.  ***Radials/DP/PT 2+ and equal bilaterally.  Respiratory:  ***Respirations regular and unlabored, clear to auscultation bilaterally. GI: Soft, nontender, nondistended. MS: No deformity or atrophy. Skin: Warm and dry, no rash. Neuro:  Strength and sensation are intact. Psych: Normal affect.  Assessment & Plan    1. PAF not on anticoagulation due to frequent falls -  2. Orthostatic hypotension-  3. Nonobstructive coronary artery disease by [facility] 2017-  Disposition: Follow up {follow up:15908} with Dr. Rockey Situ or APP  Signed, Loel Dubonnet, NP 10/10/2020, 9:06 AM [place]

## 2020-10-10 NOTE — Progress Notes (Signed)
Patient: [name]  Service Category: E/M  Provider: Gaspar Cola, MD  DOB: 11-22-1955  DOS: 10/10/2020  Location: Office  MRN: [id number]  Setting: Ambulatory outpatient  Referring Provider: [name], NP  Type: Established Patient  Specialty: Interventional Pain Management  PCP: [name], NP  Location: Remote location  Delivery: TeleHealth     Virtual Encounter - Pain Management PROVIDER NOTE: Information contained herein reflects review and annotations entered in association with encounter. Interpretation of such information and data should be left to medically-trained personnel. Information provided to patient can be located elsewhere in the medical record under "Patient Instructions". Document created using STT-dictation technology, any transcriptional errors that may result from process are unintentional.    Contact & Pharmacy Preferred: [phone] Home: [phone] (home) Mobile: [phone] (mobile) E-mail: No e-mail address on record  [place], [place] - [place] [address] Phone: [phone] Fax: [phone]   Pre-screening  [name] offered "in-person" vs "virtual" encounter. She indicated preferring virtual for this encounter.   Reason COVID-19*  Social distancing based on CDC and AMA recommendations.   I contacted [name] on 10/10/2020 via telephone.      I clearly identified myself as Gaspar Cola, MD. I verified that I was speaking with the correct person using two identifiers (Name: [name], and date of birth: [birth year]).  Consent I sought verbal advanced consent from [name] for virtual visit interactions. I informed [name] of possible security and privacy concerns, risks, and limitations associated with providing "not-in-person" medical evaluation and management services. I also informed [name] of the availability of "in-person" appointments.  Finally, I informed her that there would be a charge for the virtual visit and that she could be  personally, fully or partially, financially responsible for it. [name] expressed understanding and agreed to proceed.   Historic Elements   [name] is a 65 y.o. year old, female patient evaluated today after our last contact on 10/07/2020. [name]  has a past medical history of Acute encephalopathy (05/22/2018), Allergy, Anginal pain ([place]), Anxiety, Ascites, C. difficile colitis (07/10/2015), Cancer ([place]), Cirrhosis of liver not due to alcohol ([place]) (2016), Degenerative disk disease, Diverticulitis, Gastroparesis, GERD (gastroesophageal reflux disease), History of hiatal hernia, Hypertension, Hypothyroid, Hypothyroidism due to amiodarone, Ileus ([place]) (08/01/2015), Intussusception intestine ([place]) (05/2015), Orthostatic hypotension, PAF (paroxysmal atrial fibrillation) ([place]) (03/2015), Pancreatitis, Pneumonia (11/14/2015), Pneumonia (07/2020), Right ureteral stone (07/14/2016), Sepsis ([place]) (12/15/2017), Sick sinus syndrome ([place]), Stomach ulcer, Stroke ([place]), Syncope (01/2015), Syncope due to orthostatic hypotension (05/18/2015), Tachyarrhythmia (01/10/2016), TIA (transient ischemic attack) (02/2015), Type 1 diabetes ([place]), UTERINE CANCER, HX OF (03/27/2007), and UTI (urinary tract infection) (05/22/2018). She also  has a past surgical history that includes Hernia repair; Abdominal hysterectomy; Cholecystectomy; Esophagogastroduodenoscopy (N/A, 04/04/2015); Cardiac catheterization (N/A, 01/12/2016); Esophagogastroduodenoscopy (egd) with propofol (N/A, 01/18/2016); Flexible sigmoidoscopy (N/A, 01/18/2016); Cystoscopy/ureteroscopy/holmium laser (Right, 07/14/2016); Esophagogastroduodenoscopy (N/A, 12/28/2017); Cystoscopy with biopsy (N/A, 12/11/2019); Cystoscopy with fulgeration (N/A, 12/11/2019); Cystoscopy w/ retrogrades (Bilateral, 12/11/2019); Ureteroscopy (Right, 12/11/2019); Cystoscopy with stent placement (Right, 12/11/2019);  and Cystoscopy with urethral dilatation (Right, 12/11/2019). [name] has a current medication list which includes the following prescription(s): albuterol, atorvastatin, cephalexin, cyclobenzaprine, accu-chek aviva plus, levemir flextouch, melatonin, metoprolol tartrate, midodrine, nitroglycerin, oxycodone, pantoprazole, sertraline, spacer/aero-hold chamber bags, and techlite pen needles. She  reports that she has quit smoking. Her smoking use included cigarettes. She has never used smokeless tobacco. She  reports that she does not drink alcohol and does not use drugs. [name] is allergic to aspirin, codeine sulfate, erythromycin, prednisone, rosiglitazone maleate, and tetanus-diphtheria toxoids td.   HPI  Today, she is being contacted for medication management.  The patient indicates doing well with the current medication regimen. No adverse reactions or side effects reported to the medications.  The patient indicates having tested positive for COVID-19 over 8 days ago.  She is currently on quarantine.  She recently went to ED secondary to having back pain and hyperglycemia with a blood sugar over 600.  She was found to have a UTI in addition to the COVID-19 upper respiratory infection.  She is currently having runny nose and cough.  No fever, but this may be because on 10/08/2020, she was started on antibiotics for the UTI.  A urine analysis showed multiple types of bacteria.  Today we will send the patient a prescription to her pharmacy for 30 days.  We will be going down on her oxycodone from every 4 hours to every 6 hours after the drug holiday that she recently completed.  When she returns to the clinic will probably repeat her UDS.  RTCB:  Nonopioid transferred 06/29/2020: Flexeril  Pharmacotherapy Assessment  Analgesic: Oxycodone IR 5 mg, 1 tab PO q 6 hrs (20 mg/day of oxycodone). VERY High Risk. (See 06/29/2020 note) MAX. MME: 200 mg/day MME/day: 30 mg/day.   Monitoring: [place] PMP: PDMP reviewed  during this encounter.       Pharmacotherapy: No side-effects or adverse reactions reported. Compliance: No problems identified. Effectiveness: Clinically acceptable. Plan: Refer to "POC".  UDS:  Summary  Date Value Ref Range Status  04/06/2020 Note  Final    Comment:    ==================================================================== [employer] Select 13 (MW) ==================================================================== Test                             Result       Flag       Units  Drug Absent but Declared for Prescription Verification   Oxycodone                      Not Detected UNEXPECTED ng/mg creat ==================================================================== Test                      Result    Flag   Units      Ref Range   Creatinine              39               mg/dL      >=20 ==================================================================== Declared Medications:  The flagging and interpretation on this report are based on the  following declared medications.  Unexpected results may arise from  inaccuracies in the declared medications.   **Note: The testing scope of this panel includes these medications:   Oxycodone (Roxicodone)   **Note: The testing scope of this panel does not include the  following reported medications:   Albuterol (Ventolin HFA)  Atorvastatin (Lipitor)  Cephalexin (Keflex)  Cyclobenzaprine (Flexeril)  Insulin (Levemir)  Magnesium (Magonate)  Nitroglycerin (Nitrostat)  Sertraline (Zoloft)  Vitamin D3 ==================================================================== For clinical consultation, please call [phone]. ====================================================================     Laboratory Chemistry Profile   Renal Lab Results  Component Value Date   BUN 19 10/08/2020   CREATININE 1.17 (H) 10/08/2020   GFR 42.53 (L) 08/18/2019   GFRAA  49 (L) 02/20/2020   GFRNONAA 51 (L) 10/08/2020      Hepatic Lab Results  Component Value Date   AST 37 10/08/2020   ALT 23 10/08/2020   ALBUMIN 3.7 10/08/2020   ALKPHOS 137 (H) 10/08/2020   HCVAB 0.2 09/25/2018   AMYLASE 28 08/01/2015   LIPASE 31 10/08/2020   AMMONIA 46 (H) 08/13/2019     Electrolytes Lab Results  Component Value Date   NA 136 10/08/2020   K 3.8 10/08/2020   CL 102 10/08/2020   CALCIUM 9.4 10/08/2020   MG 1.9 09/29/2020   PHOS 3.3 08/27/2020     Bone Lab Results  Component Value Date   VD25OH 4.7 (L) 11/25/2018     Inflammation (CRP: Acute Phase) (ESR: Chronic Phase) Lab Results  Component Value Date   CRP 14.8 (H) 07/18/2020   ESRSEDRATE 79 (H) 11/25/2018   LATICACIDVEN 2.2 ([place]) 09/27/2020       Note: Above Lab results reviewed.  Imaging  ECHOCARDIOGRAM COMPLETE BUBBLE STUDY    ECHOCARDIOGRAM REPORT       Patient Name:   SAPIR LAVEY Date of Exam: 09/28/2020 Medical Rec #:  [id number]       Height:       63.0 in Accession #:    [id number]      Weight:       171.0 lb Date of Birth:  [birth year]       BSA:          1.809 m Patient Age:    40 years        BP:           154/74 mmHg Patient Gender: F               HR:           85 bpm. Exam Location:  [facility]  Procedure: 2D Echo, Color Doppler, Cardiac Doppler and Saline Contrast Bubble            Study  Indications:     I63.9 Stroke   History:         Patient has prior history of Echocardiogram examinations, most                  recent 07/18/2020. TIA; Risk Factors:Hypertension and Diabetes.                  Pt tested positive for COVID-19 on 09/27/2020.   Sonographer:     Charmayne Sheer RDCS (AE) Referring Phys:  [id number] [name] N [facility] Diagnosing Phys: Kate Sable MD    Sonographer Comments: Suboptimal apical window and no subcostal window. Image acquisition challenging due to patient body habitus and Image acquisition challenging due to respiratory motion. IMPRESSIONS   1. Left ventricular ejection fraction, by estimation, is 55 to 60%.  The left ventricle has normal function. The left ventricle has no regional wall motion abnormalities. Left ventricular diastolic parameters are consistent with Grade I diastolic  dysfunction (impaired relaxation).  2. Right ventricular systolic function is normal. The right ventricular size is normal.  3. The mitral valve is normal in structure. No evidence of mitral valve regurgitation.  4. The aortic valve is grossly normal. Aortic valve regurgitation is not visualized.  FINDINGS  Left Ventricle: Left ventricular ejection fraction, by estimation, is 55 to 60%. The left ventricle has normal function. The left ventricle has no regional wall motion abnormalities. The left ventricular internal cavity size was normal in size. There is  no left ventricular hypertrophy. Left ventricular diastolic parameters are consistent with Grade I diastolic dysfunction (impaired relaxation).  Right Ventricle: The right ventricular size is normal. No increase in right ventricular wall thickness. Right ventricular systolic function is normal.  Left Atrium: Left atrial size was normal in size.  Right Atrium: Right atrial size was normal in size.  Pericardium: There is no evidence of pericardial effusion.  Mitral Valve: The mitral valve is normal in structure. No evidence of mitral valve regurgitation. MV peak gradient, 4.2 mmHg. The mean mitral valve gradient is 2.0 mmHg.  Tricuspid Valve: The tricuspid valve is grossly normal. Tricuspid valve regurgitation is not demonstrated.  Aortic Valve: The aortic valve is grossly normal. Aortic valve regurgitation is not visualized. Aortic valve mean gradient measures 1.0 mmHg. Aortic valve peak gradient measures 2.8 mmHg. Aortic valve area, by VTI measures 4.68 cm.  Pulmonic Valve: The pulmonic valve was not well visualized. Pulmonic valve regurgitation is not visualized.  Aorta: The aortic root is normal in size and structure.  IAS/Shunts: No atrial level shunt  detected by color flow Doppler. Agitated saline contrast was given intravenously to evaluate for intracardiac shunting.    LEFT VENTRICLE PLAX 2D LVIDd:         4.20 cm  Diastology LVIDs:         3.10 cm  LV e' medial:    6.85 cm/s LV PW:         1.30 cm  LV E/e' medial:  6.1 LV IVS:        0.80 cm  LV e' lateral:   5.00 cm/s LVOT diam:     2.40 cm  LV E/e' lateral: 8.3 LV SV:         66 LV SV Index:   37 LVOT Area:     4.52 cm    LEFT ATRIUM           Index LA diam:      2.80 cm 1.55 cm/m LA Vol (A4C): 61.9 ml 34.22 ml/m  AORTIC VALVE                   PULMONIC VALVE AV Area (Vmax):    3.78 cm    PV Vmax:       1.05 m/s AV Area (Vmean):   4.07 cm    PV Vmean:      70.200 cm/s AV Area (VTI):     4.68 cm    PV VTI:        0.171 m AV Vmax:           84.30 cm/s  PV Peak grad:  4.4 mmHg AV Vmean:          55.400 cm/s PV Mean grad:  2.0 mmHg AV VTI:            0.141 m AV Peak Grad:      2.8 mmHg AV Mean Grad:      1.0 mmHg LVOT Vmax:         70.50 cm/s LVOT Vmean:        49.800 cm/s LVOT VTI:          0.146 m LVOT/AV VTI ratio: 1.04   AORTA Ao Root diam: 3.60 cm  MITRAL VALVE MV Area (PHT): 8.82 cm    SHUNTS MV Area VTI:   5.24 cm    Systemic VTI:  0.15 m MV Peak grad:  4.2 mmHg    Systemic Diam: 2.40 cm MV Mean grad:  2.0 mmHg MV Vmax:       1.02 m/s MV Vmean:      61.5 cm/s MV Decel Time: 86 msec MV E velocity: 41.60 cm/s MV A velocity: 86.50 cm/s MV E/A ratio:  0.48  Kate Sable MD Electronically signed by Kate Sable MD Signature Date/Time: 09/28/2020/3:55:50 PM      Final   MR ANGIO HEAD WO CONTRAST CLINICAL DATA:  Initial evaluation for acute stroke.  EXAM: MRI HEAD WITHOUT CONTRAST  MRA HEAD WITHOUT CONTRAST  TECHNIQUE: Multiplanar, multiecho pulse sequences of the brain and surrounding structures were obtained without intravenous contrast. Angiographic images of the head were obtained using MRA technique  without contrast.  COMPARISON:  Prior CT from earlier the same day.  FINDINGS: MRI HEAD FINDINGS  Brain: Mild diffuse prominence of the CSF containing spaces compatible generalized age-related cerebral atrophy. Patchy and confluent T2/FLAIR hyperintensity within the periventricular and deep white matter of both cerebral hemispheres most consistent with chronic small vessel ischemic disease, mild to moderate in nature. Patchy involvement of the central pons with probable superimposed remote lacunar infarct. Subcentimeter focus of FLAIR hyperintensity involving the left occipital cortex likely reflects a small remote cortical infarct (series 22, image 26). Tiny remote right cerebellar infarct noted as well (series 17, image 8).  No abnormal foci of restricted diffusion to suggest acute or subacute ischemia. Gray-white matter differentiation maintained. No other areas of remote cortical infarction. No foci of susceptibility artifact to suggest acute or chronic intracranial hemorrhage.  No mass lesion, midline shift or mass effect. No hydrocephalus or extra-axial fluid collection. Incidental note made of a partially empty sella. Midline structures intact.  Vascular: Major intracranial vascular flow voids are maintained.  Skull and upper cervical spine: Craniocervical junction within normal limits. Bone marrow signal intensity within normal limits. Hyperostosis frontalis interna noted. No scalp soft tissue abnormality.  Sinuses/Orbits: Globes and orbital soft tissues demonstrate no acute finding. Chronic right frontoethmoidal sinusitis, with additional mild scattered mucosal thickening within the ethmoidal air cells and maxillary sinuses. Superimposed small left maxillary sinus retention cyst noted. Left mastoid and middle ear effusion noted, of uncertain significance. Inner ear structures grossly within normal limits. No abnormality about the visualized nasopharynx.  Other:  None.  MRA HEAD FINDINGS  ANTERIOR CIRCULATION:  Visualized distal cervical segments of the internal carotid arteries are widely patent with antegrade flow. Petrous, cavernous, and supraclinoid ICAs widely patent without stenosis or other abnormality. A1 segments widely patent. Normal anterior communicating artery complex. Anterior cerebral arteries widely patent their distal aspects without stenosis. No M1 stenosis or occlusion. Negative MCA bifurcations. Distal MCA branches well perfused and symmetric.  POSTERIOR CIRCULATION:  Both vertebral arteries widely patent to the vertebrobasilar junction without stenosis. Both PICA origins patent and normal. Basilar patent to its distal aspect without stenosis. Superior cerebellar arteries are patent proximally. Left PCA supplied via the basilar. Predominant fetal type origin of the right PCA. Both PCAs well perfused to their distal aspects without stenosis.  No intracranial aneurysm.  IMPRESSION: MRI HEAD IMPRESSION:  1. No acute intracranial infarct or other abnormality. 2. Age-related cerebral atrophy with mild-to-moderate chronic small vessel ischemic disease, with a few scattered small remote ischemic infarcts as above. 3. Left mastoid and middle ear effusion, of uncertain significance. Correlation with any symptomatology and physical exam recommended. 4. Chronic right frontoethmoidal sinusitis.  MRA HEAD IMPRESSION:  Normal intracranial MRA. No large vessel occlusion, hemodynamically significant stenosis, or other acute vascular abnormality.  Electronically Signed   By: Marland Kitchen  Jeannine Boga M.D.   On: 09/27/2020 22:37 MR BRAIN WO CONTRAST CLINICAL DATA:  Initial evaluation for acute stroke.  EXAM: MRI HEAD WITHOUT CONTRAST  MRA HEAD WITHOUT CONTRAST  TECHNIQUE: Multiplanar, multiecho pulse sequences of the brain and surrounding structures were obtained without intravenous contrast. Angiographic images of the  head were obtained using MRA technique without contrast.  COMPARISON:  Prior CT from earlier the same day.  FINDINGS: MRI HEAD FINDINGS  Brain: Mild diffuse prominence of the CSF containing spaces compatible generalized age-related cerebral atrophy. Patchy and confluent T2/FLAIR hyperintensity within the periventricular and deep white matter of both cerebral hemispheres most consistent with chronic small vessel ischemic disease, mild to moderate in nature. Patchy involvement of the central pons with probable superimposed remote lacunar infarct. Subcentimeter focus of FLAIR hyperintensity involving the left occipital cortex likely reflects a small remote cortical infarct (series 22, image 26). Tiny remote right cerebellar infarct noted as well (series 17, image 8).  No abnormal foci of restricted diffusion to suggest acute or subacute ischemia. Gray-white matter differentiation maintained. No other areas of remote cortical infarction. No foci of susceptibility artifact to suggest acute or chronic intracranial hemorrhage.  No mass lesion, midline shift or mass effect. No hydrocephalus or extra-axial fluid collection. Incidental note made of a partially empty sella. Midline structures intact.  Vascular: Major intracranial vascular flow voids are maintained.  Skull and upper cervical spine: Craniocervical junction within normal limits. Bone marrow signal intensity within normal limits. Hyperostosis frontalis interna noted. No scalp soft tissue abnormality.  Sinuses/Orbits: Globes and orbital soft tissues demonstrate no acute finding. Chronic right frontoethmoidal sinusitis, with additional mild scattered mucosal thickening within the ethmoidal air cells and maxillary sinuses. Superimposed small left maxillary sinus retention cyst noted. Left mastoid and middle ear effusion noted, of uncertain significance. Inner ear structures grossly within normal limits. No abnormality about  the visualized nasopharynx.  Other: None.  MRA HEAD FINDINGS  ANTERIOR CIRCULATION:  Visualized distal cervical segments of the internal carotid arteries are widely patent with antegrade flow. Petrous, cavernous, and supraclinoid ICAs widely patent without stenosis or other abnormality. A1 segments widely patent. Normal anterior communicating artery complex. Anterior cerebral arteries widely patent their distal aspects without stenosis. No M1 stenosis or occlusion. Negative MCA bifurcations. Distal MCA branches well perfused and symmetric.  POSTERIOR CIRCULATION:  Both vertebral arteries widely patent to the vertebrobasilar junction without stenosis. Both PICA origins patent and normal. Basilar patent to its distal aspect without stenosis. Superior cerebellar arteries are patent proximally. Left PCA supplied via the basilar. Predominant fetal type origin of the right PCA. Both PCAs well perfused to their distal aspects without stenosis.  No intracranial aneurysm.  IMPRESSION: MRI HEAD IMPRESSION:  1. No acute intracranial infarct or other abnormality. 2. Age-related cerebral atrophy with mild-to-moderate chronic small vessel ischemic disease, with a few scattered small remote ischemic infarcts as above. 3. Left mastoid and middle ear effusion, of uncertain significance. Correlation with any symptomatology and physical exam recommended. 4. Chronic right frontoethmoidal sinusitis.  MRA HEAD IMPRESSION:  Normal intracranial MRA. No large vessel occlusion, hemodynamically significant stenosis, or other acute vascular abnormality.  Electronically Signed   By: Jeannine Boga M.D.   On: 09/27/2020 22:37 US Carotid Bilateral (at [facility] and [facility] only) CLINICAL DATA:  Stroke-like symptoms  EXAM: BILATERAL CAROTID DUPLEX ULTRASOUND  TECHNIQUE: Pearline Cables scale imaging, color Doppler and duplex ultrasound were performed of bilateral carotid and vertebral arteries in the  neck.  COMPARISON:  None  FINDINGS: Criteria: Quantification  of carotid stenosis is based on velocity parameters that correlate the residual internal carotid diameter with NASCET-based stenosis levels, using the diameter of the distal internal carotid lumen as the denominator for stenosis measurement.  The following velocity measurements were obtained:  RIGHT  ICA: 49/12 cm/sec  CCA: 74/94 cm/sec  SYSTOLIC ICA/CCA RATIO:  0.6  ECA: 56 cm/sec  LEFT  ICA: 68/23 cm/sec  CCA: 49/67 cm/sec  SYSTOLIC ICA/CCA RATIO:  1.2  ECA: 104 cm/sec  RIGHT CAROTID ARTERY: Preliminary grayscale images demonstrates tortuosity of the carotid artery. Minimal atherosclerotic plaque is noted. Evaluation of the waveforms, velocities and flow velocity ratios however demonstrate no evidence of focal hemodynamically significant stenosis.  RIGHT VERTEBRAL ARTERY:  Antegrade in nature.  LEFT CAROTID ARTERY: Preliminary grayscale images demonstrate mild atherosclerotic plaque. Waveforms, velocities and flow velocity ratios however demonstrate no evidence of focal hemodynamically significant stenosis.  LEFT VERTEBRAL ARTERY:  Antegrade in nature.  IMPRESSION: Minimal atherosclerotic plaque bilaterally. No focal hemodynamically significant carotid stenosis is seen.  Electronically Signed   By: Inez Catalina M.D.   On: 09/27/2020 20:15  Assessment  The primary encounter diagnosis was Chronic pain syndrome. Diagnoses of Chronic low back pain (Primary area of Pain) (Bilateral) (L>R) w/ sciatica (Bilateral), Chronic lower extremity pain (Secondary Area of Pain) (Bilateral) (L>R), Lumbar facet syndrome (Bilateral) (R>L), Fibromyalgia syndrome, Pharmacologic therapy, and Uncomplicated opioid dependence ([place]) were also pertinent to this visit.  Plan of Care  Problem-specific:  No problem-specific Assessment & Plan notes found for this encounter.  Ms. LANETA GUERIN has a current medication list  which includes the following long-term medication(s): albuterol, cyclobenzaprine, levemir flextouch, metoprolol tartrate, nitroglycerin, oxycodone, pantoprazole, and sertraline.  Pharmacotherapy (Medications Ordered): Meds ordered this encounter  Medications  . oxyCODONE (OXY IR/ROXICODONE) 5 MG immediate release tablet    Sig: Take 1 tablet (5 mg total) by mouth every 6 (six) hours as needed for severe pain. Must last 30 days    Dispense:  120 tablet    Refill:  0    Chronic Pain: STOP Act (Not applicable) Fill 1 day early if closed on refill date. Avoid benzodiazepines within 8 hours of opioids   Orders:  No orders of the defined types were placed in this encounter.  Follow-up plan:   No follow-ups on file.      Interventional management options:  Considering:   NOTE: Eliquis ANTICOAGULATION (Stop: 3 days  Restart: 6 hrs)  Diagnostic bilateral celiac plexus block  Diagnostic bilateral lumbar facet block  Possible bilateral lumbar facet RFA  Diagnostic bilateral cervical facet block  Possible bilateral cervical facet RFA    PRN Procedures:   None at this time        Recent Visits Date Type Provider Dept  08/10/20 Telemedicine Milinda Pointer, MD [facility]  Showing recent visits within past 90 days and meeting all other requirements Today's Visits Date Type Provider Dept  10/10/20 Telemedicine Milinda Pointer, MD [facility]  Showing today's visits and meeting all other requirements Future Appointments No visits were found meeting these conditions. Showing future appointments within next 90 days and meeting all other requirements  I discussed the assessment and treatment plan with the patient. The patient was provided an opportunity to ask questions and all were answered. The patient agreed with the plan and demonstrated an understanding of the instructions.  Patient advised to call back or seek an in-person evaluation if the symptoms or  condition worsens.  Duration of encounter: 15 minutes.  Note by: Gaspar Cola, MD Date: 10/10/2020; Time: 4:30 PM

## 2020-10-11 ENCOUNTER — Encounter: Payer: Self-pay | Admitting: Family

## 2020-10-24 ENCOUNTER — Telehealth: Payer: Self-pay

## 2020-10-24 NOTE — Telephone Encounter (Signed)
[place] Night - Client TELEPHONE ADVICE RECORD AccessNurse Patient Name: [name] Gender: Female DOB: 07/23/1960 Age: 64 Y 2 M 29 D Return Phone Number: [phone] (Primary) Address: City/State/Zip: [place] [address] Client [facility] Night - Client Client Site [place] Physician Waunita Schooner- MD Contact Type Call Who Is Calling Pharmacy Call Type Pharmacy Send to RN Chief Complaint Paging or Request for Consult Reason for Call Request to clarify medication order Initial Comment Caller states, needs instructions on a prescription. Ref [id number] Pharmacy Name [facility] Pharmacist Name [place] Number [phone] Translation No Nurse Assessment Nurse: Duanne Limerick, RN, Macky Lower Date/Time Eilene Ghazi Time): 10/21/2020 5:29:41 PM Please select the assessment type ---Pharmacy clarification Additional Documentation ---Pharmacy needs clarification on instructions for Clobetasol ointment. states use 1 applicator vaginally. States it does not come with an applicator. Is there an on-call physician for the client? ---Yes Do the client directives allow paging the on call for medication concerns? ---Yes Document information that requires clarification. ---Instructions on Clobetasol ointment Additional Documentation ---Instructions given to pharmacy Disp. Time Eilene Ghazi Time) Disposition Final User 10/21/2020 5:43:11 PM Called On-Call Provider Duanne Limerick, RN, [facility] 10/21/2020 5:49:11 PM Clinical Call Yes Duanne Limerick, RN, [facility] Phone DateTime Result/Outcome Message Type Notes Nani Ravens MD [phone] 10/21/2020 5:43:11 PM Called On Call Provider - Reached Doctor Paged Nani Ravens, - MD 10/21/2020 5:44:17 PM Spoke with On Call - General Message Result Per MD. Apply Clobetasol to the affected vaginal area once daily for 7 days

## 2020-10-24 NOTE — Telephone Encounter (Signed)
Patient also called access nurse 2/11 at 6:00 stating she still is testing positive for COVID-19 and is unable to come in office for visit. Stated she cannot do video but can do phone visit. Patient stated she mainly needed a refill for muscle relaxer as pain clinic no longer prescribing them.

## 2020-10-24 NOTE — Telephone Encounter (Signed)
When was she diagnosed with Covid? She could test positive for months. I need to see her in the office for follow up, this must be done in order to receive a refill.  She will need to be scheduled at the end of a session or in a 40 min slot.

## 2020-10-24 NOTE — Telephone Encounter (Signed)
Called to schedule follow up visit. Pt not home and husband will have her call back to set up visit.

## 2020-10-26 LAB — BLOOD GAS, VENOUS
Acid-base deficit: 2 mmol/L (ref 0.0–2.0)
Bicarbonate: 25 mmol/L (ref 20.0–28.0)
O2 Saturation: 37 %
Patient temperature: 37
pCO2, Ven: 52 mmHg (ref 44.0–60.0)
pH, Ven: 7.29 (ref 7.250–7.430)

## 2020-10-27 ENCOUNTER — Telehealth (INDEPENDENT_AMBULATORY_CARE_PROVIDER_SITE_OTHER): Payer: Medicare Other | Admitting: Primary Care

## 2020-10-27 ENCOUNTER — Encounter: Payer: Self-pay | Admitting: Primary Care

## 2020-10-27 ENCOUNTER — Telehealth: Payer: Self-pay

## 2020-10-27 ENCOUNTER — Other Ambulatory Visit: Payer: Self-pay

## 2020-10-27 ENCOUNTER — Other Ambulatory Visit: Payer: Medicare Other

## 2020-10-27 ENCOUNTER — Other Ambulatory Visit: Payer: Self-pay | Admitting: Primary Care

## 2020-10-27 VITALS — Temp 100.5°F | Ht 63.0 in | Wt 169.0 lb

## 2020-10-27 DIAGNOSIS — G8929 Other chronic pain: Secondary | ICD-10-CM

## 2020-10-27 DIAGNOSIS — R042 Hemoptysis: Secondary | ICD-10-CM

## 2020-10-27 DIAGNOSIS — M7918 Myalgia, other site: Secondary | ICD-10-CM | POA: Diagnosis not present

## 2020-10-27 DIAGNOSIS — R319 Hematuria, unspecified: Secondary | ICD-10-CM

## 2020-10-27 DIAGNOSIS — R296 Repeated falls: Secondary | ICD-10-CM | POA: Diagnosis not present

## 2020-10-27 DIAGNOSIS — E1165 Type 2 diabetes mellitus with hyperglycemia: Secondary | ICD-10-CM

## 2020-10-27 DIAGNOSIS — M5441 Lumbago with sciatica, right side: Secondary | ICD-10-CM

## 2020-10-27 DIAGNOSIS — U071 COVID-19: Secondary | ICD-10-CM

## 2020-10-27 DIAGNOSIS — F411 Generalized anxiety disorder: Secondary | ICD-10-CM | POA: Diagnosis not present

## 2020-10-27 DIAGNOSIS — R197 Diarrhea, unspecified: Secondary | ICD-10-CM

## 2020-10-27 DIAGNOSIS — M5442 Lumbago with sciatica, left side: Secondary | ICD-10-CM

## 2020-10-27 DIAGNOSIS — M797 Fibromyalgia: Secondary | ICD-10-CM

## 2020-10-27 MED ORDER — SERTRALINE HCL 50 MG PO TABS: 50.0000 mg | ORAL_TABLET | Freq: Every day | ORAL | 3 refills | Status: AC

## 2020-10-27 MED ORDER — CYCLOBENZAPRINE HCL 10 MG PO TABS: 10.0000 mg | ORAL_TABLET | Freq: Three times a day (TID) | ORAL | 0 refills | Status: DC | PRN

## 2020-10-27 MED ORDER — LEVEMIR FLEXTOUCH 100 UNIT/ML ~~LOC~~ SOPN: 32.0000 [IU] | PEN_INJECTOR | Freq: Two times a day (BID) | SUBCUTANEOUS | 1 refills | Status: DC

## 2020-10-27 NOTE — Assessment & Plan Note (Signed)
Diagnosed approximately 4 weeks ago, could be experiencing residual symptoms.  Repeat labs and chest x-ray pending. Consider referral to post Covid care clinic.

## 2020-10-27 NOTE — Assessment & Plan Note (Signed)
A1c of 9.1 in January 2022, home glucose readings now are comparable.  Increase Levemir to 32 units 2 times daily, continue watch glucose levels, we will see her back in 1 month for follow-up.

## 2020-10-27 NOTE — Telephone Encounter (Signed)
Per Kate's request I have called patient:  Given information and location of where to get xray. Urged her to try to get done today or before labs tomorrow at latest se will go have done as soon as she can get ride.  Lab appointment made for tomorrow the only time she can get ride is 12. I have given all information to patient as well as phone number to call when she arrives. I have noted on lab schedule that I will draw labs they will let me know when she has arrived.  Follow up appointment made in office with patient for one month. Let her know it is supper important to keep all appointments in order to help [name] give her the best possible care. At her request I have mailed letter with time and date of follow up to home address on file.   Patient also informed that levemir should be increased to 32 U twice a day. Had patient repeat instructions to me. She will call if any questions.

## 2020-10-27 NOTE — Assessment & Plan Note (Signed)
Following with cardiology, endorses today that she is compliant with midodrine 5 mg 3 times daily as prescribed.  Continue same.

## 2020-10-27 NOTE — Telephone Encounter (Signed)
Noted, thank you very much!!

## 2020-10-27 NOTE — Assessment & Plan Note (Signed)
Patient denies gross hematuria, she does have evidence of hematuria from prior UAs.  Repeat UA pending, will add microscopic.  There was mention of suspicion of bladder cancer during her hospitalization in January 2022, patient was unaware of this.  Chart review conducted after phone visit which reveals she underwent bladder biopsy in April 2021 which did not show evidence of malignancy.

## 2020-10-27 NOTE — Assessment & Plan Note (Signed)
Patient endorses 5 watery episodes of diarrhea daily for the last 4 to 5 weeks.  Stool studies pending.

## 2020-10-27 NOTE — Assessment & Plan Note (Signed)
Patient endorsing intermittent blood-tinged sputum when coughing.  Chest x-ray pending.

## 2020-10-27 NOTE — Patient Instructions (Signed)
Please come by for labs as scheduled.  Complete your x-ray as scheduled.  Please return the stool kit as directed.  It was a pleasure to see you today!

## 2020-10-27 NOTE — Progress Notes (Signed)
Subjective:    Patient ID: [name], female    DOB: [birth year], 65 y.o.   MRN: [id number]  HPI     [name] - 66 y.o. female  MRN [id number]  Date of Birth: 1955/10/23  PCP: [name], NP  This service was provided via telemedicine. Phone Visit performed on 10/27/2020    Rationale for phone visit along with limitations reviewed. I discussed the limitations, risks, security and privacy concerns of performing a phone visit and the availability of in person appointments. I also discussed with the patient that there may be a patient responsible charge related to this service. Patient consented to telephone encounter.    Location of patient: Home Location of provider: Office [place] [facility] Participants; patient and myself Name of referring provider: N/A   Names of persons and role in encounter: Provider: [name], NP  Patient: [name]  Other: N/A   Time on call: 13 min - 36 sec   Subjective: Chief Complaint  Patient presents with  . Cough    Tightness in chest with fever. Symptoms have not improved from covid in December      HPI:  [name] is a 65 year old female with a significant medical history including hypertension, CVA, atrial fibrillation, type 2 diabetes, chroinc back pain, cirrhosis of liver, CAP, medication non compliance, Covid-19 infection who presents today for ED and hospital follow up.  She presented to [facility] ED on 10/08/20 with a chief complaint of hyperglycemia. Diagnosed with Covid-19 around September 28, 2020, persistent nausea, vomiting since diagnosis, unable to eat or drink.   During her ED visit she was noted to be tachycardic. No hypoxemia or respiratory distress. CBC without acute infection, CMP with increased creatinine, normal anion gap. She was noted to have prolonged QTC interval so antiemetics were not prescribed. She was treated with IV fluids, glucose level dropped from 265 to 42, 49. UA with red and  white cells, rare bacteria, treated with IV Rocephin. A1C of 9.1 on 09/28/20. She was discharged home. Urine culture returned which showed numerous specimens.   Admitted to [facility] on 09/27/20 for stroke like symptoms, difficulty speaking and walking for three days, numbness to left side for 5 days. Also fell three times PTA. During her hospital stay MR brain was negative for new stroke, she did have remote lacunar strokes. MRA negative for LVO.  Cardiology consulted who decreased metoprolol to 12.5 mg BID, she was noted to be in atrial fibrillation, not a candidate for anticoagulation due to falls. Midodrine 5 mg TID added at discharge, she had been taking this before. She was advised to follow up with Urology in the outpatient setting due to concern of bladder cancer. A1C of 9.1, she was encouraged to continue her Levemir to 30 units. Tested positive for Covid-19 during this admission.   Admitted to [facility] on 08/27/21 due to multiple falls with syncope, AMS. She was noted to have positive orthostatic vitals. She was initiated on Midodrine for orthostatic changes. She developed atrial fibrillation with RVR, received IV diltiazem. TSH was mildly elevated with normal Free T4. It was recommended she discharge to SNF, she declined. She was discharged home on 08/31/20 with PCP follow up recommendations.   Admitted to [facility] on 07/16/20 for symptoms of SOB, cough, change in taste/smell. Covid negative x 2. Chest xray with basilar atelectasis or pneumonia, treated with IV antibiotics, breathing treatments. CT with evidence of esophagitis, PPI was continued. She  was discharged home on 07/16/21 with oral antibiotics and recommendations for PCP, GI, and cardiology follow up.  Today she endorses she misses her appointments in our clinic due to inability to find transportation. She denies knowing anything regarding bladder cancer or the need to follow up with Urology.  She is experiencing symptoms of shortness of breath,  rhinorrhea, coughing up tinges of bright red blood, diarrhea (5 episodes of water stool per day), fevers ranging 100-101.  The symptoms began about 4-5 weeks ago. She is trying to remain hydrated with water, denies vomiting. She completed her antibiotics from her hospitalization in late January 2022.  She's checking her glucose levels twice daily and is getting readings of high 100's-low 200's. She's been visited by home health nursing twice, PT once through [facility], she's not been visited in weeks. She fell two days ago.. was rising from her rocking chair, fell over to her right side, hit the bookcase with her head, had to have her husband help her back up. She's noticed intermittent dizziness when rising from seated positions so she's trying to focus on rising slowly. She endorses compliance to her midodrine.   She is needing refills of her sertraline 50 mg, this has been very helpful for stress in the home.  She is also requesting cyclobenzaprine refills.  She is still following with pain management.  Objective/Observations:  No physical exam or vital signs collected unless specifically identified below.   Temp (!) 100.5 F (38.1 C) (Oral)   Ht 5' 3"  (1.6 m)   Wt 169 lb (76.7 kg)   BMI 29.94 kg/m    Respiratory status: speaks in complete sentences without evident shortness of breath.   Assessment/Plan:  See problem based charting  No problem-specific Assessment & Plan notes found for this encounter.   I discussed the assessment and treatment plan with the patient. The patient was provided an opportunity to ask questions and all were answered. The patient agreed with the plan and demonstrated an understanding of the instructions.  Lab Orders  No laboratory test(s) ordered today    No orders of the defined types were placed in this encounter.   The patient was advised to call back or seek an in-person evaluation if the symptoms worsen or if the condition fails to improve as  anticipated.  [name], NP    Review of Systems  Constitutional: Positive for fatigue and fever.  Respiratory: Positive for cough and shortness of breath.        Hemoptysis  Gastrointestinal: Positive for diarrhea. Negative for abdominal pain, blood in stool, nausea and vomiting.  Genitourinary: Negative for hematuria.  Musculoskeletal: Positive for arthralgias and myalgias.  Neurological: Positive for dizziness.  Psychiatric/Behavioral:       Feels well managed on sertraline       Past Medical History:  Diagnosis Date  . Acute encephalopathy 05/22/2018  . Allergy   . Anginal pain ([place])   . Anxiety   . Ascites   . C. difficile colitis 07/10/2015  . Cancer (HCC)    HX OF CANCER OF UTERUS   . Cirrhosis of liver not due to alcohol ([place]) 2016  . Degenerative disk disease   . Diverticulitis   . Gastroparesis   . GERD (gastroesophageal reflux disease)   . History of hiatal hernia   . Hypertension   . Hypothyroid   . Hypothyroidism due to amiodarone   . Ileus ([place]) 08/01/2015  . Intussusception intestine ([place]) 05/2015  . Orthostatic hypotension   .  PAF (paroxysmal atrial fibrillation) ([place]) 03/2015   a. new onset 03/2015 in setting of intractable N/V; b. on Eliquis 5 mg bid; c. CHADSVASc 4 (DM, TIA x 2, female)  . Pancreatitis   . Pneumonia 11/14/2015  . Pneumonia 07/2020  . Right ureteral stone 07/14/2016  . Sepsis ([place]) 12/15/2017  . Sick sinus syndrome ([place])   . Stomach ulcer   . Stroke [facility])    with minimal left sided weakness  . Syncope 01/2015  . Syncope due to orthostatic hypotension 05/18/2015  . Tachyarrhythmia 01/10/2016  . TIA (transient ischemic attack) 02/2015  . Type 1 diabetes ([place])    on levemir  . UTERINE CANCER, HX OF 03/27/2007   Qualifier: Diagnosis of  By: Maxie Better FNP, Rosalita Levan   . UTI (urinary tract infection) 05/22/2018     Social History   Socioeconomic History  . Marital status: Married    Spouse name: Nabeeha Badertscher   . Number of  children: Not on file  . Years of education: Not on file  . Highest education level: Not on file  Occupational History  . Occupation: Disabled 2nd back problems  Tobacco Use  . Smoking status: Former Smoker    Types: Cigarettes  . Smokeless tobacco: Never Used  . Tobacco comment: 25 years ago and only smoked occasionally  Vaping Use  . Vaping Use: Never used  Substance and Sexual Activity  . Alcohol use: No  . Drug use: No  . Sexual activity: Not Currently  Other Topics Concern  . Not on file  Social History Narrative   Lives in [place], [place] with her husband and 2 sons.   Social Determinants of Health   Financial Resource Strain: Not on file  Food Insecurity: Not on file  Transportation Needs: Not on file  Physical Activity: Not on file  Stress: Not on file  Social Connections: Not on file  Intimate Partner Violence: Not on file    Past Surgical History:  Procedure Laterality Date  . ABDOMINAL HYSTERECTOMY    . CARDIAC CATHETERIZATION N/A 01/12/2016   Procedure: Left Heart Cath and Coronary Angiography;  Surgeon: Wellington Hampshire, MD;  Location: [name] [facility];  Service: Cardiovascular;  Laterality: N/A;  . CHOLECYSTECTOMY    . CYSTOSCOPY W/ RETROGRADES Bilateral 12/11/2019   Procedure: CYSTOSCOPY WITH RETROGRADE PYELOGRAM;  Surgeon: Billey Co, MD;  Location: [facility] ORS;  Service: Urology;  Laterality: Bilateral;  . CYSTOSCOPY WITH BIOPSY N/A 12/11/2019   Procedure: CYSTOSCOPY WITH BLADDER BIOPSY;  Surgeon: Billey Co, MD;  Location: [facility];  Service: Urology;  Laterality: N/A;  . CYSTOSCOPY WITH FULGERATION N/A 12/11/2019   Procedure: CYSTOSCOPY WITH FULGERATION;  Surgeon: Billey Co, MD;  Location: [facility];  Service: Urology;  Laterality: N/A;  . CYSTOSCOPY WITH STENT PLACEMENT Right 12/11/2019   Procedure: CYSTOSCOPY WITH STENT PLACEMENT;  Surgeon: Billey Co, MD;  Location: [facility];  Service: Urology;  Laterality: Right;  . CYSTOSCOPY WITH  URETHRAL DILATATION Right 12/11/2019   Procedure: CYSTOSCOPY WITH URETERAL DILATATION;  Surgeon: Billey Co, MD;  Location: [facility];  Service: Urology;  Laterality: Right;  . CYSTOSCOPY/URETEROSCOPY/HOLMIUM LASER Right 07/14/2016   Procedure: CYSTOSCOPY/URETEROSCOPY/HOLMIUM LASER;  Surgeon: Alexis Frock, MD;  Location: [facility];  Service: Urology;  Laterality: Right;  . ESOPHAGOGASTRODUODENOSCOPY N/A 04/04/2015   Procedure: ESOPHAGOGASTRODUODENOSCOPY (EGD);  Surgeon: Hulen Luster, MD;  Location: [facility] ENDOSCOPY;  Service: Endoscopy;  Laterality: N/A;  . ESOPHAGOGASTRODUODENOSCOPY N/A 12/28/2017   Procedure: ESOPHAGOGASTRODUODENOSCOPY (EGD);  Surgeon: Lin Landsman, MD;  Location: [facility] ENDOSCOPY;  Service: Gastroenterology;  Laterality: N/A;  . ESOPHAGOGASTRODUODENOSCOPY (EGD) WITH PROPOFOL N/A 01/18/2016   Procedure: ESOPHAGOGASTRODUODENOSCOPY (EGD) WITH PROPOFOL;  Surgeon: Lucilla Lame, MD;  Location: [facility] ENDOSCOPY;  Service: Endoscopy;  Laterality: N/A;  . FLEXIBLE SIGMOIDOSCOPY N/A 01/18/2016   Procedure: FLEXIBLE SIGMOIDOSCOPY;  Surgeon: Lucilla Lame, MD;  Location: [facility] ENDOSCOPY;  Service: Endoscopy;  Laterality: N/A;  . HERNIA REPAIR    . URETEROSCOPY Right 12/11/2019   Procedure: Christen Butter;  Surgeon: Billey Co, MD;  Location: [facility] ORS;  Service: Urology;  Laterality: Right;    Family History  Problem Relation Age of Onset  . Hypertension Mother   . CAD Sister   . Heart attack Sister        Deceased 11-07-2014  . CAD Brother     Allergies  Allergen Reactions  . Aspirin Rash  . Codeine Sulfate Rash  . Erythromycin Rash    Reaction:  Fever   . Prednisone Swelling  . Rosiglitazone Maleate Swelling  . Tetanus-Diphtheria Toxoids Td Rash and Other (See Comments)    Reaction:  Fever     Current Outpatient Medications on File Prior to Visit  Medication Sig Dispense Refill  . albuterol (VENTOLIN HFA) 108 (90 Base) MCG/ACT inhaler Inhale 2 puffs into the lungs  every 4 (four) hours as needed for wheezing or shortness of breath. 8 g 1  . atorvastatin (LIPITOR) 40 MG tablet Take 40 mg by mouth at bedtime.    Marland Kitchen glucose blood (ACCU-CHEK AVIVA PLUS) test strip Use as instructed to test blood sugar up to 4 times daily 300 each 2  . Melatonin 10 MG CAPS Take 1 tablet by mouth at bedtime.    . metoprolol tartrate (LOPRESSOR) 25 MG tablet Take 0.5 tablets (12.5 mg total) by mouth 2 (two) times daily. This is a decrease from your previous 25 mg twice daily.    . midodrine (PROAMATINE) 5 MG tablet Take 1 tablet (5 mg total) by mouth 3 (three) times daily with meals. To help prevent blood pressure dropping when you stand. 90 tablet 2  . nitroGLYCERIN (NITROSTAT) 0.3 MG SL tablet Place 1 tablet (0.3 mg total) under the tongue every 5 (five) minutes as needed for chest pain. 100 tablet 0  . oxyCODONE (OXY IR/ROXICODONE) 5 MG immediate release tablet Take 1 tablet (5 mg total) by mouth every 6 (six) hours as needed for severe pain. Must last 30 days 120 tablet 0  . pantoprazole (PROTONIX) 40 MG tablet Take 1 tablet (40 mg total) by mouth daily. 30 tablet 0  . Spacer/Aero-Hold Chamber Bags MISC Use spacer with inhaler when you have shortness of breath 1 each 1  . TECHLITE PEN NEEDLES 32G X 6 MM MISC USE TWICE DAILY AS DIRECTED 100 each 0   No current facility-administered medications on file prior to visit.    Temp (!) 100.5 F (38.1 C) (Oral)   Ht 5' 3"  (1.6 m)   Wt 169 lb (76.7 kg)   BMI 29.94 kg/m    Objective:   Physical Exam Constitutional:      General: She is not in acute distress. Pulmonary:     Effort: Pulmonary effort is normal.     Comments: No cough during visit Neurological:     Mental Status: She is alert and oriented to person, place, and time.  Psychiatric:        Mood and Affect: Mood normal.  Assessment & Plan:

## 2020-10-27 NOTE — Assessment & Plan Note (Signed)
Doing well on sertraline 50 mg, refill sent to pharmacy.

## 2020-10-27 NOTE — Assessment & Plan Note (Signed)
Referral [facility] out for [facility] as she has not been visited in several weeks.

## 2020-10-27 NOTE — Assessment & Plan Note (Signed)
Refill provided for cyclobenzaprine 10 mg, discussed to use sparingly and only if needed.  Pain management no longer filling.

## 2020-10-28 ENCOUNTER — Other Ambulatory Visit: Payer: Medicare Other

## 2020-11-01 ENCOUNTER — Other Ambulatory Visit: Payer: Medicare Other

## 2020-11-01 ENCOUNTER — Ambulatory Visit (INDEPENDENT_AMBULATORY_CARE_PROVIDER_SITE_OTHER)
Admission: RE | Admit: 2020-11-01 | Discharge: 2020-11-01 | Disposition: A | Discharge status: Home / Self Care | Payer: Medicare Other | Source: Ambulatory Visit | Attending: Primary Care | Admitting: Primary Care

## 2020-11-01 ENCOUNTER — Other Ambulatory Visit (INDEPENDENT_AMBULATORY_CARE_PROVIDER_SITE_OTHER): Payer: Medicare Other

## 2020-11-01 DIAGNOSIS — R319 Hematuria, unspecified: Secondary | ICD-10-CM | POA: Diagnosis not present

## 2020-11-01 DIAGNOSIS — R042 Hemoptysis: Secondary | ICD-10-CM

## 2020-11-01 LAB — COMPREHENSIVE METABOLIC PANEL
ALT: 17 U/L (ref 0–35)
AST: 22 U/L (ref 0–37)
Albumin: 3.8 g/dL (ref 3.5–5.2)
Alkaline Phosphatase: 135 U/L — ABNORMAL HIGH (ref 39–117)
BUN: 31 mg/dL — ABNORMAL HIGH (ref 6–23)
CO2: 26 mEq/L (ref 19–32)
Calcium: 9 mg/dL (ref 8.4–10.5)
Chloride: 103 mEq/L (ref 96–112)
Creatinine, Ser: 1.53 mg/dL — ABNORMAL HIGH (ref 0.40–1.20)
GFR: 35.16 mL/min — ABNORMAL LOW (ref >60.00)
Glucose, Bld: 259 mg/dL — ABNORMAL HIGH (ref 70–99)
Potassium: 4.3 mEq/L (ref 3.5–5.1)
Sodium: 137 mEq/L (ref 135–145)
Total Bilirubin: 0.5 mg/dL (ref 0.2–1.2)
Total Protein: 7.1 g/dL (ref 6.0–8.3)

## 2020-11-01 LAB — CBC WITH DIFFERENTIAL/PLATELET
Basophils Absolute: 0 10*3/uL (ref 0.0–0.1)
Basophils Relative: 0.2 % (ref 0.0–3.0)
Eosinophils Absolute: 0.1 10*3/uL (ref 0.0–0.7)
Eosinophils Relative: 1.7 % (ref 0.0–5.0)
HCT: 34.6 % — ABNORMAL LOW (ref 36.0–46.0)
Hemoglobin: 11 g/dL — ABNORMAL LOW (ref 12.0–15.0)
Lymphocytes Relative: 15.4 % (ref 12.0–46.0)
Lymphs Abs: 0.9 10*3/uL (ref 0.7–4.0)
MCHC: 31.7 g/dL (ref 30.0–36.0)
MCV: 81.3 fl (ref 78.0–100.0)
Monocytes Absolute: 0.3 10*3/uL (ref 0.1–1.0)
Monocytes Relative: 4.9 % (ref 3.0–12.0)
Neutro Abs: 4.4 10*3/uL (ref 1.4–7.7)
Neutrophils Relative %: 77.8 % — ABNORMAL HIGH (ref 43.0–77.0)
Platelets: 166 10*3/uL (ref 150.0–400.0)
RBC: 4.25 Mil/uL (ref 3.87–5.11)
RDW: 17.1 % — ABNORMAL HIGH (ref 11.5–15.5)
WBC: 5.7 10*3/uL (ref 4.0–10.5)

## 2020-11-01 LAB — URINALYSIS, ROUTINE W REFLEX MICROSCOPIC
Bilirubin Urine: NEGATIVE
Ketones, ur: NEGATIVE
Nitrite: NEGATIVE
Specific Gravity, Urine: 1.025 (ref 1.000–1.030)
Total Protein, Urine: 100 — AB
Urine Glucose: >=1000 — AB
Urobilinogen, UA: 1 (ref 0.0–1.0)
pH: 6.5 (ref 5.0–8.0)

## 2020-11-01 NOTE — Addendum Note (Signed)
Addended by: Cloyd Stagers on: 11/01/2020 01:22 PM   Modules accepted: Orders

## 2020-11-02 NOTE — Addendum Note (Signed)
Addended by: Ellamae Sia on: 11/02/2020 12:37 PM   Modules accepted: Orders

## 2020-11-04 ENCOUNTER — Other Ambulatory Visit: Payer: Self-pay | Admitting: Primary Care

## 2020-11-04 DIAGNOSIS — N3001 Acute cystitis with hematuria: Secondary | ICD-10-CM

## 2020-11-04 LAB — URINE CULTURE
MICRO NUMBER:: 11569683
SPECIMEN QUALITY:: ADEQUATE

## 2020-11-04 MED ORDER — NITROFURANTOIN MONOHYD MACRO 100 MG PO CAPS: 100.0000 mg | ORAL_CAPSULE | Freq: Two times a day (BID) | ORAL | 0 refills | Status: AC

## 2020-11-09 ENCOUNTER — Emergency Department: Payer: Medicare HMO

## 2020-11-09 ENCOUNTER — Encounter: Payer: Self-pay | Admitting: Pain Medicine

## 2020-11-09 ENCOUNTER — Other Ambulatory Visit: Payer: Self-pay

## 2020-11-09 ENCOUNTER — Telehealth: Payer: Self-pay

## 2020-11-09 ENCOUNTER — Emergency Department
Admission: EM | Admit: 2020-11-09 | Discharge: 2020-11-09 | Disposition: A | Discharge status: Home / Self Care | Payer: Medicare HMO | Attending: Emergency Medicine | Admitting: Emergency Medicine

## 2020-11-09 DIAGNOSIS — W19XXXA Unspecified fall, initial encounter: Secondary | ICD-10-CM

## 2020-11-09 DIAGNOSIS — K219 Gastro-esophageal reflux disease without esophagitis: Secondary | ICD-10-CM | POA: Insufficient documentation

## 2020-11-09 DIAGNOSIS — E1149 Type 2 diabetes mellitus with other diabetic neurological complication: Secondary | ICD-10-CM | POA: Diagnosis not present

## 2020-11-09 DIAGNOSIS — R1011 Right upper quadrant pain: Secondary | ICD-10-CM | POA: Diagnosis not present

## 2020-11-09 DIAGNOSIS — Z794 Long term (current) use of insulin: Secondary | ICD-10-CM | POA: Diagnosis not present

## 2020-11-09 DIAGNOSIS — I129 Hypertensive chronic kidney disease with stage 1 through stage 4 chronic kidney disease, or unspecified chronic kidney disease: Secondary | ICD-10-CM | POA: Insufficient documentation

## 2020-11-09 DIAGNOSIS — Z955 Presence of coronary angioplasty implant and graft: Secondary | ICD-10-CM | POA: Diagnosis not present

## 2020-11-09 DIAGNOSIS — R55 Syncope and collapse: Secondary | ICD-10-CM | POA: Insufficient documentation

## 2020-11-09 DIAGNOSIS — R109 Unspecified abdominal pain: Secondary | ICD-10-CM | POA: Diagnosis not present

## 2020-11-09 DIAGNOSIS — Z79899 Other long term (current) drug therapy: Secondary | ICD-10-CM | POA: Diagnosis not present

## 2020-11-09 DIAGNOSIS — R519 Headache, unspecified: Secondary | ICD-10-CM | POA: Diagnosis not present

## 2020-11-09 DIAGNOSIS — W228XXA Striking against or struck by other objects, initial encounter: Secondary | ICD-10-CM | POA: Diagnosis not present

## 2020-11-09 DIAGNOSIS — Z8616 Personal history of COVID-19: Secondary | ICD-10-CM | POA: Insufficient documentation

## 2020-11-09 DIAGNOSIS — N1831 Chronic kidney disease, stage 3a: Secondary | ICD-10-CM | POA: Diagnosis not present

## 2020-11-09 DIAGNOSIS — I2510 Atherosclerotic heart disease of native coronary artery without angina pectoris: Secondary | ICD-10-CM | POA: Insufficient documentation

## 2020-11-09 DIAGNOSIS — R42 Dizziness and giddiness: Secondary | ICD-10-CM | POA: Insufficient documentation

## 2020-11-09 DIAGNOSIS — E1122 Type 2 diabetes mellitus with diabetic chronic kidney disease: Secondary | ICD-10-CM | POA: Insufficient documentation

## 2020-11-09 DIAGNOSIS — E039 Hypothyroidism, unspecified: Secondary | ICD-10-CM | POA: Diagnosis not present

## 2020-11-09 DIAGNOSIS — Z8542 Personal history of malignant neoplasm of other parts of uterus: Secondary | ICD-10-CM | POA: Insufficient documentation

## 2020-11-09 DIAGNOSIS — E1110 Type 2 diabetes mellitus with ketoacidosis without coma: Secondary | ICD-10-CM | POA: Insufficient documentation

## 2020-11-09 DIAGNOSIS — R0902 Hypoxemia: Secondary | ICD-10-CM | POA: Diagnosis not present

## 2020-11-09 DIAGNOSIS — Z87891 Personal history of nicotine dependence: Secondary | ICD-10-CM | POA: Insufficient documentation

## 2020-11-09 DIAGNOSIS — M542 Cervicalgia: Secondary | ICD-10-CM | POA: Diagnosis not present

## 2020-11-09 DIAGNOSIS — R0789 Other chest pain: Secondary | ICD-10-CM | POA: Insufficient documentation

## 2020-11-09 DIAGNOSIS — E1165 Type 2 diabetes mellitus with hyperglycemia: Secondary | ICD-10-CM | POA: Diagnosis not present

## 2020-11-09 LAB — CBC
HCT: 33.3 % — ABNORMAL LOW (ref 36.0–46.0)
Hemoglobin: 10.5 g/dL — ABNORMAL LOW (ref 12.0–15.0)
MCH: 26.2 pg (ref 26.0–34.0)
MCHC: 31.5 g/dL (ref 30.0–36.0)
MCV: 83 fL (ref 80.0–100.0)
Platelets: 148 10*3/uL — ABNORMAL LOW (ref 150–400)
RBC: 4.01 MIL/uL (ref 3.87–5.11)
RDW: 16.1 % — ABNORMAL HIGH (ref 11.5–15.5)
WBC: 6 10*3/uL (ref 4.0–10.5)
nRBC: 0 % (ref 0.0–0.2)

## 2020-11-09 LAB — BASIC METABOLIC PANEL
Anion gap: 6 (ref 5–15)
BUN: 21 mg/dL (ref 8–23)
CO2: 22 mmol/L (ref 22–32)
Calcium: 8.5 mg/dL — ABNORMAL LOW (ref 8.9–10.3)
Chloride: 106 mmol/L (ref 98–111)
Creatinine, Ser: 1.37 mg/dL — ABNORMAL HIGH (ref 0.44–1.00)
GFR, Estimated: 43 mL/min — ABNORMAL LOW (ref >60)
Glucose, Bld: 422 mg/dL — ABNORMAL HIGH (ref 70–99)
Potassium: 4.3 mmol/L (ref 3.5–5.1)
Sodium: 134 mmol/L — ABNORMAL LOW (ref 135–145)

## 2020-11-09 MED ORDER — LACTATED RINGERS IV BOLUS
1000.0000 mL | Freq: Once | INTRAVENOUS | Status: AC
  Administered 2020-11-09: 1000 mL via INTRAVENOUS

## 2020-11-09 MED ORDER — IOHEXOL 300 MG/ML  SOLN
75.0000 mL | Freq: Once | INTRAMUSCULAR | Status: AC | PRN
  Administered 2020-11-09: 75 mL via INTRAVENOUS

## 2020-11-09 MED ORDER — LIDOCAINE 5 % EX PTCH: 1.0000 | MEDICATED_PATCH | Freq: Two times a day (BID) | CUTANEOUS | 0 refills | Status: DC

## 2020-11-09 MED ORDER — MORPHINE SULFATE (PF) 4 MG/ML IV SOLN
4.0000 mg | Freq: Once | INTRAVENOUS | Status: AC
  Administered 2020-11-09: 4 mg via INTRAVENOUS
  Filled 2020-11-09: qty 1

## 2020-11-09 NOTE — ED Notes (Signed)
Patient transported to CT 

## 2020-11-09 NOTE — ED Provider Notes (Signed)
[facility] Emergency Department Provider Note   ____________________________________________   Event Date/Time   First MD Initiated Contact with Patient 11/09/20 2110     (approximate)  I have reviewed the triage vital signs and the nursing notes.   HISTORY  Chief Complaint Fall    HPI [name] is a 65 y.o. female with past medical history of hypertension, hyperlipidemia, atrial fibrillation, diabetes, cirrhosis, and chronic orthostatic hypotension on midodrine who presents to the ED for fall.  Patient reports that she got up to go to the bathroom yesterday and felt very lightheaded, eventually lost consciousness and fell to the ground.  She reports hitting her head as well as her right ribs.  She has been dealing with a headache and pain along her right lower ribs since the fall.  She states pain is worsening since then.  She denies any neck pain, also denies any numbness or weakness.  She has not taken anything yet for pain.        Past Medical History:  Diagnosis Date  . Acute encephalopathy 05/22/2018  . Allergy   . Anginal pain ([place])   . Anxiety   . Ascites   . C. difficile colitis 07/10/2015  . Cancer (HCC)    HX OF CANCER OF UTERUS   . Cirrhosis of liver not due to alcohol ([place]) 2016  . Degenerative disk disease   . Diverticulitis   . Gastroparesis   . GERD (gastroesophageal reflux disease)   . History of hiatal hernia   . Hypertension   . Hypothyroid   . Hypothyroidism due to amiodarone   . Ileus ([place]) 08/01/2015  . Intussusception intestine ([place]) 05/2015  . Orthostatic hypotension   . PAF (paroxysmal atrial fibrillation) ([place]) 03/2015   a. new onset 03/2015 in setting of intractable N/V; b. on Eliquis 5 mg bid; c. CHADSVASc 4 (DM, TIA x 2, female)  . Pancreatitis   . Pneumonia 11/14/2015  . Pneumonia 07/2020  . Right ureteral stone 07/14/2016  . Sepsis ([place]) 12/15/2017  . Sick sinus syndrome ([place])   . Stomach ulcer   . Stroke  [place])    with minimal left sided weakness  . Syncope 01/2015  . Syncope due to orthostatic hypotension 05/18/2015  . Tachyarrhythmia 01/10/2016  . TIA (transient ischemic attack) 02/2015  . Type 1 diabetes ([place])    on levemir  . UTERINE CANCER, HX OF 03/27/2007   Qualifier: Diagnosis of  By: Maxie Better FNP, Rosalita Levan   . UTI (urinary tract infection) 05/22/2018    Patient Active Problem List   Diagnosis Date Noted  . Hematuria 10/27/2020  . Hemoptysis 10/27/2020  . Weakness 09/28/2020  . COVID-19 virus infection   . Stroke ([place]) 09/27/2020  . Atrial fibrillation with RVR ([place]) 08/27/2020  . Hypoglycemia 08/27/2020  . CKD (chronic kidney disease), stage III ([place]) 08/27/2020  . Shortness of breath 07/16/2020  . [place] intertrigo 07/16/2020  . Sepsis ([place]) 11/23/2019  . Acute pyelonephritis 09/30/2019  . Acute pain of left foot 08/18/2019  . Type II diabetes mellitus with renal manifestations ([place]) 08/13/2019  . Weakness of both lower extremities 07/31/2019  . Altered mental status 07/14/2019  . GAD (generalized anxiety disorder) 06/11/2019  . Poor memory 06/11/2019  . Constipation 05/25/2019  . Acute medial meniscus tear of left knee 05/25/2019  . AKI (acute kidney injury) ([place]) 05/16/2019  . Closed fracture of left distal fibula 05/16/2019  . Sinus tachycardia 05/05/2019  . Congestive dilated cardiomyopathy (  [place]) 04/14/2019  . CAD (coronary artery disease) 04/10/2019  . Closed fracture of lateral malleolus 04/10/2019  . Lumbar facet syndrome (Bilateral) (R>L) 03/09/2019  . Long term (current) use of anticoagulants 03/09/2019  . Unspecified cirrhosis of liver ([place]) 02/11/2019  . Hypoalbuminemia 02/11/2019  . Edema due to hypoalbuminemia 02/11/2019  . Lower extremity edema 01/26/2019  . Prolonged QT interval 12/17/2018  . Hypomagnesemia 12/17/2018  . Uncontrolled type 2 diabetes mellitus ([place]) 12/17/2018  . H/O TIA (transient ischemic attack) and stroke 12/17/2018  .  Personal history of surgery to heart and great vessels, presenting hazards to health 12/17/2018  . DDD (degenerative disc disease), lumbar 12/17/2018  . Lumbar intervertebral disc displacement 12/17/2018  . Chronic musculoskeletal pain 12/17/2018  . Chronic generalized pain 12/17/2018  . Hyponatremia 12/17/2018  . Fibromyalgia syndrome 12/17/2018  . Other intervertebral disc displacement, lumbar region 12/17/2018  . Vitamin D deficiency 11/26/2018  . Elevated C-reactive protein (CRP) 11/26/2018  . Elevated sed rate 11/26/2018  . Chronic low back pain (Primary area of Pain) (Bilateral) (L>R) w/ sciatica (Bilateral) 11/25/2018  . Chronic lower extremity pain (Secondary Area of Pain) (Bilateral) (L>R) 11/25/2018  . Chronic neck pain 11/25/2018  . Pharmacologic therapy 11/25/2018  . Disorder of skeletal system 11/25/2018  . Problems influencing health status 11/25/2018  . Renal mass 10/06/2018  . Chronic intermittent abdominal pain 09/25/2018  . Unspecified abdominal pain 09/25/2018  . Frequent falls 09/12/2018  . Orthostatic hypotension 08/18/2018  . Normochromic normocytic anemia 08/18/2018  . Acute renal failure superimposed on stage 3a chronic kidney disease ([place]) 08/18/2018  . History of hypothyroidism 08/14/2018  . Medical non-compliance 08/14/2018  . Diabetic ketoacidosis ([place]) 05/22/2018  . Urinary tract infection, acute 05/22/2018  . Diarrhea   . NSTEMI (non-ST elevated myocardial infarction) ([place]) 01/11/2016  . Tachy-brady syndrome ([place]) 12/28/2015  . PAF (paroxysmal atrial fibrillation) ([place]) 11/24/2015  . Type 2 diabetes mellitus with neurologic complication ([place]) 56/31/4970  . S/P cholecystectomy 11/23/2015  . Narcotic abuse ([place]) 11/22/2015  . Chronic superficial gastritis without bleeding 11/22/2015  . History of Clostridium difficile 11/22/2015  . History of TIA (transient ischemic attack) 11/22/2015  . Mixed hyperlipidemia 11/22/2015  . Diverticulosis 11/22/2015   . History of uterine cancer 11/22/2015  . Hypothyroidism, unspecified 11/22/2015  . Intractable cyclical vomiting with nausea 11/22/2015  . Community acquired pneumonia of left lower lobe of lung 11/14/2015  . Narcotic withdrawal ([place]) 11/11/2015  . Hyperlipidemia 08/31/2015  . Hypotension 08/06/2015  . Cryptogenic cirrhosis ([place]) 07/10/2015  . Atrial fibrillation ([place]) 07/10/2015  . Malnutrition of moderate degree 07/04/2015  . Symptomatic bradycardia 05/27/2015  . Chronic anemia 05/19/2015  . Thrombocytopenia ([place]) 05/19/2015  . Hypokalemia 04/06/2015  . Hyperlipidemia with target LDL less than 100 04/06/2015  . Syncope 03/11/2015  . CVA (cerebral vascular accident) ([place]) 02/15/2015  . Essential hypertension 01/12/2015  . Chronically on opiate therapy 01/12/2015  . Cirrhosis of liver not due to alcohol ([place]) 2016  . S/P Nissen fundoplication (without gastrostomy tube) procedure 05/11/2014  . Dysphagia 04/19/2014  . Myofascial pain syndrome 06/27/2007  . Chronic pain syndrome 03/28/2007  . GERD 03/27/2007  . Diverticulosis of large intestine without perforation or abscess without bleeding 03/27/2007  . Proteinuria 03/27/2007    Past Surgical History:  Procedure Laterality Date  . ABDOMINAL HYSTERECTOMY    . CARDIAC CATHETERIZATION N/A 01/12/2016   Procedure: Left Heart Cath and Coronary Angiography;  Surgeon: Wellington Hampshire, MD;  Location: [place] [facility];  Service: Cardiovascular;  Laterality: N/A;  . CHOLECYSTECTOMY    . CYSTOSCOPY W/ RETROGRADES Bilateral 12/11/2019   Procedure: CYSTOSCOPY WITH RETROGRADE PYELOGRAM;  Surgeon: Billey Co, MD;  Location: [facility];  Service: Urology;  Laterality: Bilateral;  . CYSTOSCOPY WITH BIOPSY N/A 12/11/2019   Procedure: CYSTOSCOPY WITH BLADDER BIOPSY;  Surgeon: Billey Co, MD;  Location: [facility];  Service: Urology;  Laterality: N/A;  . CYSTOSCOPY WITH FULGERATION N/A 12/11/2019   Procedure: CYSTOSCOPY WITH FULGERATION;   Surgeon: Billey Co, MD;  Location: [facility];  Service: Urology;  Laterality: N/A;  . CYSTOSCOPY WITH STENT PLACEMENT Right 12/11/2019   Procedure: CYSTOSCOPY WITH STENT PLACEMENT;  Surgeon: Billey Co, MD;  Location: [facility];  Service: Urology;  Laterality: Right;  . CYSTOSCOPY WITH URETHRAL DILATATION Right 12/11/2019   Procedure: CYSTOSCOPY WITH URETERAL DILATATION;  Surgeon: Billey Co, MD;  Location: [facility];  Service: Urology;  Laterality: Right;  . CYSTOSCOPY/URETEROSCOPY/HOLMIUM LASER Right 07/14/2016   Procedure: CYSTOSCOPY/URETEROSCOPY/HOLMIUM LASER;  Surgeon: Alexis Frock, MD;  Location: [facility];  Service: Urology;  Laterality: Right;  . ESOPHAGOGASTRODUODENOSCOPY N/A 04/04/2015   Procedure: ESOPHAGOGASTRODUODENOSCOPY (EGD);  Surgeon: Hulen Luster, MD;  Location: [facility];  Service: Endoscopy;  Laterality: N/A;  . ESOPHAGOGASTRODUODENOSCOPY N/A 12/28/2017   Procedure: ESOPHAGOGASTRODUODENOSCOPY (EGD);  Surgeon: Lin Landsman, MD;  Location: [facility] ENDOSCOPY;  Service: Gastroenterology;  Laterality: N/A;  . ESOPHAGOGASTRODUODENOSCOPY (EGD) WITH PROPOFOL N/A 01/18/2016   Procedure: ESOPHAGOGASTRODUODENOSCOPY (EGD) WITH PROPOFOL;  Surgeon: Lucilla Lame, MD;  Location: [facility] ENDOSCOPY;  Service: Endoscopy;  Laterality: N/A;  . FLEXIBLE SIGMOIDOSCOPY N/A 01/18/2016   Procedure: FLEXIBLE SIGMOIDOSCOPY;  Surgeon: Lucilla Lame, MD;  Location: [facility] ENDOS[facility];  Service: Endoscopy;  Laterality: N/A;  . HERNIA REPAIR    . URETEROSCOPY Right 12/11/2019   Procedure: Christen Butter;  Surgeon: Billey Co, MD;  Location: [facility];  Service: Urology;  Laterality: Right;    Prior to Admission medications   Medication Sig Start Date End Date Taking? Authorizing Provider  lidocaine (LIDODERM) 5 % Place 1 patch onto the skin every 12 (twelve) hours. Remove & Discard patch within 12 hours or as directed by MD 11/09/20 11/09/21 Yes Blake Divine, MD  albuterol (VENTOLIN HFA)  108 (90 Base) MCG/ACT inhaler Inhale 2 puffs into the lungs every 4 (four) hours as needed for wheezing or shortness of breath. 07/18/20   Elodia Florence., MD  atorvastatin (LIPITOR) 40 MG tablet Take 40 mg by mouth at bedtime.    [provider]  cyclobenzaprine (FLEXERIL) 10 MG tablet Take 1 tablet (10 mg total) by mouth 3 (three) times daily as needed for muscle spasms. Must last 30 days. 10/27/20 01/25/21  [name], NP  glucose blood (ACCU-CHEK AVIVA PLUS) test strip Use as instructed to test blood sugar up to 4 times daily 10/09/19   [name], NP  insulin detemir (LEVEMIR FLEXTOUCH) 100 UNIT/ML FlexPen Inject 32 Units into the skin 2 (two) times daily. 10/27/20   [name], NP  Melatonin 10 MG CAPS Take 1 tablet by mouth at bedtime.    [provider]  metoprolol tartrate (LOPRESSOR) 25 MG tablet Take 0.5 tablets (12.5 mg total) by mouth 2 (two) times daily. This is a decrease from your previous 25 mg twice daily. 09/29/20   Enzo Bi, MD  midodrine (PROAMATINE) 5 MG tablet Take 1 tablet (5 mg total) by mouth 3 (three) times daily with meals. To help prevent blood pressure dropping when you stand. 09/29/20  12/28/20  Enzo Bi, MD  nitrofurantoin, macrocrystal-monohydrate, (MACROBID) 100 MG capsule Take 1 capsule (100 mg total) by mouth 2 (two) times daily for 5 days. 2020-11-20 11/09/20  [name], NP  nitroGLYCERIN (NITROSTAT) 0.3 MG SL tablet Place 1 tablet (0.3 mg total) under the tongue every 5 (five) minutes as needed for chest pain. 07/24/19 07/23/20  Nena Polio, MD  oxyCODONE (OXY IR/ROXICODONE) 5 MG immediate release tablet Take 1 tablet (5 mg total) by mouth every 6 (six) hours as needed for severe pain. Must last 30 days 10/10/20 11/09/20  Milinda Pointer, MD  pantoprazole (PROTONIX) 40 MG tablet Take 1 tablet (40 mg total) by mouth daily. 07/18/20 08/17/20  Elodia Florence., MD  sertraline (ZOLOFT) 50 MG tablet Take 1 tablet (50  mg total) by mouth daily. For anxiety and depression. 10/27/20   [name], NP  Spacer/Aero-Hold Chamber Bags MISC Use spacer with inhaler when you have shortness of breath 07/18/20   Elodia Florence., MD  TECHLITE PEN NEEDLES 32G X 6 MM MISC USE TWICE DAILY AS DIRECTED 09/12/20   [name], NP    Allergies Aspirin, Codeine sulfate, Erythromycin, Prednisone, Rosiglitazone maleate, and Tetanus-diphtheria toxoids td  Family History  Problem Relation Age of Onset  . Hypertension Mother   . CAD Sister   . Heart attack Sister        Deceased 11-20-2014  . CAD Brother     Social History Social History   Tobacco Use  . Smoking status: Former Smoker    Types: Cigarettes  . Smokeless tobacco: Never Used  . Tobacco comment: 25 years ago and only smoked occasionally  Vaping Use  . Vaping Use: Never used  Substance Use Topics  . Alcohol use: No  . Drug use: No    Review of Systems  Constitutional: No fever/chills Eyes: No visual changes. ENT: No sore throat. Cardiovascular: Positive for chest wall pain.  Positive for syncope. Respiratory: Denies shortness of breath. Gastrointestinal: No abdominal pain.  No nausea, no vomiting.  No diarrhea.  No constipation. Genitourinary: Negative for dysuria. Musculoskeletal: Negative for back pain. Skin: Negative for rash. Neurological: Positive for headaches, negative for focal weakness or numbness.  ____________________________________________   PHYSICAL EXAM:  VITAL SIGNS: ED Triage Vitals  Enc Vitals Group     BP 11/09/20 1811 (!) 133/118     Pulse Rate 11/09/20 1811 (!) 106     Resp 11/09/20 1811 18     Temp 11/09/20 1811 98 F (36.7 C)     Temp src --      SpO2 11/09/20 1811 100 %     Weight --      Height --      Head Circumference --      Peak Flow --      Pain Score 11/09/20 1812 10     Pain Loc --      Pain Edu? --      Excl. in [place]? --     Constitutional: Alert and oriented. Eyes: Conjunctivae  are normal. Head: Atraumatic. Nose: No congestion/rhinnorhea. Mouth/Throat: Mucous membranes are moist. Neck: Normal ROM, no midline cervical spine tenderness. Cardiovascular: Normal rate, regular rhythm. Grossly normal heart sounds.  2+ radial pulses bilaterally. Respiratory: Normal respiratory effort.  No retractions. Lungs CTAB.  Tenderness along right lower chest wall at costal margin. Gastrointestinal: Soft and tender to palpation in the right upper quadrant with no rebound or guarding. No distention. Genitourinary: deferred  Musculoskeletal: No lower extremity tenderness nor edema. Neurologic:  Normal speech and language. No gross focal neurologic deficits are appreciated. Skin:  Skin is warm, dry and intact. No rash noted. Psychiatric: Mood and affect are normal. Speech and behavior are normal.  ____________________________________________   LABS (all labs ordered are listed, but only abnormal results are displayed)  Labs Reviewed  BASIC METABOLIC PANEL - Abnormal; Notable for the following components:      Result Value   Sodium 134 (*)    Glucose, Bld 422 (*)    Creatinine, Ser 1.37 (*)    Calcium 8.5 (*)    GFR, Estimated 43 (*)    All other components within normal limits  CBC - Abnormal; Notable for the following components:   Hemoglobin 10.5 (*)    HCT 33.3 (*)    RDW 16.1 (*)    Platelets 148 (*)    All other components within normal limits  URINALYSIS, COMPLETE (UACMP) WITH MICROSCOPIC  CBG MONITORING, ED   ____________________________________________  EKG  ED ECG REPORT I, Blake Divine, the attending physician, personally viewed and interpreted this ECG.   Date: 11/09/2020  EKG Time: 18:17  Rate: 103  Rhythm: sinus tachycardia  Axis: Normal  Intervals:none  ST&T Change: None   PROCEDURES  Procedure(s) performed (including Critical Care):  Procedures   ____________________________________________   INITIAL IMPRESSION / ASSESSMENT AND PLAN  / ED COURSE       65 year old female with past medical history of hypertension, hyperlipidemia, diabetes, atrial fibrillation, cirrhosis, chronic pain, and chronic orthostatic hypotension on midodrine who presents to the ED for syncopal episode yesterday where she fell and hit her head as well as her right chest wall.  Patient now primarily complains of headache and right chest wall pain.  CT head reviewed by me and shows no obvious hemorrhage, negative for acute process per radiology.  We will further assess with CT cervical spine as well as CT abdomen/pelvis given her tenderness.  Labs are unremarkable and EKG shows no evidence of arrhythmia or ischemia.  Syncopal episode yesterday sound similar to patient's previous episodes related to chronic orthostatic hypotension.  She is hyperglycemic here but no other electrolyte abnormality.  We will hydrate with IV fluids and reassess following CT scans.  CT cervical spine as well as abdomen/pelvis are negative for acute process, no evidence of traumatic injury.  Patient reports pain is improved and she is appropriate for discharge home, given her history of recurrent syncope and similar episode today, I do not feel admission is warranted for further syncope work-up.  Patient was counseled to follow-up with her PCP regarding syncope and chest wall pain, we will prescribe Lidoderm patches.  She was counseled to return to the ED for new worsening symptoms, patient agrees with plan.      ____________________________________________   FINAL CLINICAL IMPRESSION(S) / ED DIAGNOSES  Final diagnoses:  Fall, initial encounter  Right upper quadrant abdominal pain  Chest wall pain  Syncope, unspecified syncope type     ED Discharge Orders         Ordered    lidocaine (LIDODERM) 5 %  Every 12 hours        11/09/20 2312           Note:  This document was prepared using Dragon voice recognition software and may include unintentional dictation errors.    Blake Divine, MD 11/09/20 [phone]

## 2020-11-09 NOTE — Telephone Encounter (Signed)
LM  to call office for pre virtual appointment questions 

## 2020-11-09 NOTE — ED Triage Notes (Signed)
Pt comes via [facility] from home with c/o fall. Pt had syncopal episode. Pt states she hit her head. Pt c/o headache to right side. Pt is on blood thinners.   Pt states right rib pain as well.  [id number] VSS

## 2020-11-09 NOTE — Progress Notes (Signed)
Patient: [name]  Service Category: E/M  Provider: Gaspar Cola, MD  DOB: 1956-08-26  DOS: 11/10/2020  Location: Office  MRN: [id number]  Setting: Ambulatory outpatient  Referring Provider: [name], NP  Type: Established Patient  Specialty: Interventional Pain Management  PCP: [name], NP  Location: Remote location  Delivery: TeleHealth     Virtual Encounter - Pain Management PROVIDER NOTE: Information contained herein reflects review and annotations entered in association with encounter. Interpretation of such information and data should be left to medically-trained personnel. Information provided to patient can be located elsewhere in the medical record under "Patient Instructions". Document created using STT-dictation technology, any transcriptional errors that may result from process are unintentional.    Contact & Pharmacy Preferred: [phone] Home: [phone] (home) Mobile: [phone] (mobile) E-mail: No e-mail address on record  [facility], [place] - [place] [address] Phone: [phone] Fax: [phone]   Pre-screening  Ms. Condrey offered "in-person" vs "virtual" encounter. She indicated preferring virtual for this encounter.   Reason COVID-19*  Social distancing based on CDC and AMA recommendations.   I contacted [name] on 11/10/2020 via telephone.      I clearly identified myself as Gaspar Cola, MD. I verified that I was speaking with the correct person using two identifiers (Name: MERELYN KLUMP, and date of birth: 1955-11-20).  Consent I sought verbal advanced consent from [name] for virtual visit interactions. I informed Ms. Schuelke of possible security and privacy concerns, risks, and limitations associated with providing "not-in-person" medical evaluation and management services. I also informed Ms. Nelon of the availability of "in-person" appointments.  Finally, I informed her that there would be a charge for the virtual visit and that she could be  personally, fully or partially, financially responsible for it. Ms. Pestka expressed understanding and agreed to proceed.   Historic Elements   Ms. TESSY PAWELSKI is a 65 y.o. year old, female patient evaluated today after our last contact on 10/07/2020. Ms. Ruff  has a past medical history of Acute encephalopathy (05/22/2018), Allergy, Anginal pain ([place]), Anxiety, Ascites, C. difficile colitis (07/10/2015), Cancer ([place]), Cirrhosis of liver not due to alcohol ([place]) (2016), Degenerative disk disease, Diverticulitis, Gastroparesis, GERD (gastroesophageal reflux disease), History of hiatal hernia, Hypertension, Hypothyroid, Hypothyroidism due to amiodarone, Ileus ([place]) (08/01/2015), Intussusception intestine ([place]) (05/2015), Orthostatic hypotension, PAF (paroxysmal atrial fibrillation) ([place]) (03/2015), Pancreatitis, Pneumonia (11/14/2015), Pneumonia (07/2020), Right ureteral stone (07/14/2016), Sepsis ([place]) (12/15/2017), Sick sinus syndrome ([place]), Stomach ulcer, Stroke ([place]), Syncope (01/2015), Syncope due to orthostatic hypotension (05/18/2015), Tachyarrhythmia (01/10/2016), TIA (transient ischemic attack) (02/2015), Type 1 diabetes ([place]), UTERINE CANCER, HX OF (03/27/2007), and UTI (urinary tract infection) (05/22/2018). She also  has a past surgical history that includes Hernia repair; Abdominal hysterectomy; Cholecystectomy; Esophagogastroduodenoscopy (N/A, 04/04/2015); Cardiac catheterization (N/A, 01/12/2016); Esophagogastroduodenoscopy (egd) with propofol (N/A, 01/18/2016); Flexible sigmoidoscopy (N/A, 01/18/2016); Cystoscopy/ureteroscopy/holmium laser (Right, 07/14/2016); Esophagogastroduodenoscopy (N/A, 12/28/2017); Cystoscopy with biopsy (N/A, 12/11/2019); Cystoscopy with fulgeration (N/A, 12/11/2019); Cystoscopy w/ retrogrades (Bilateral, 12/11/2019); Ureteroscopy (Right, 12/11/2019); Cystoscopy with stent placement (Right, 12/11/2019);  and Cystoscopy with urethral dilatation (Right, 12/11/2019). Ms. Coulon has a current medication list which includes the following prescription(s): albuterol, atorvastatin, cyclobenzaprine, accu-chek aviva plus, levemir flextouch, lidocaine, melatonin, metoprolol tartrate, midodrine, nitroglycerin, oxycodone, pantoprazole, sertraline, spacer/aero-hold chamber bags, and techlite pen needles. She  reports that she has quit smoking. Her smoking use included cigarettes. She has never used smokeless tobacco. She  reports that she does not drink alcohol and does not use drugs. Ms. Applegate is allergic to aspirin, codeine sulfate, erythromycin, prednisone, rosiglitazone maleate, and tetanus-diphtheria toxoids td.   HPI  Today, she is being contacted for medication management.  The patient indicates doing well with the current medication regimen. No adverse reactions or side effects reported to the medications.  However, she complains that the medication only last 3 to 4 hours and not the 6 hours that we have it scheduled for.  Unfortunately they are some concerns about the medication and her use.  She indicates having done to the ED yesterday due to fractured ribs when she passed out.  Today on the phone, other than the expected shortness of breath, she also seems to have a mumbled speech as if she was somewhat oversedated.  Although she refers having broken some ribs, I went to the imaging section on the chart I could not find a single x-ray of the chest with any evidence of rib fractures.  They did do some CT scans of the head.  She has rescheduled to a virtual visit again, referring that she still has COVID-19.  The problem with this is that we have already identified this patient has been a very high risk for substance use disorder and narcotic abuse.  Clearly she is not being honest with me since she has already switched her face-to-face visits to virtual visits on 05/04/2020, 06/07/2020, 06/29/2020, 08/10/2020,  10/10/2020, 10/12/2020, and now 11/10/2020.  Unfortunately for her, she seems to forget that we keep track of everything and she has used the COVID-19 excuse on 06/29/2020, 08/10/2020, 10/10/2020, 10/12/2020, and again now on 11/10/2020.  She has been warned on multiple locations that the visits need to be face-to-face so that we can do pill counts and occasional drug screen test, when needed.  She already had two abnormal urine drug screening test, one on 02/04/2020 and the second one on 04/06/2020.  Neither one had any opioids when they should have been positive for oxycodone.  The patient was clearly warned about this pattern and at this point, we can no longer ignore it.  Unfortunately, she has now entered the room of noncompliance with therapy, which is something that we cannot ignore or tolerate with the controlled substances.  Today I will be sending a prescription to her pharmacy to taper her off of the opioids, but this time we will not be going back on them again.  If she wants [name] to help with her pain, this will be done strictly with interventional therapies but no controlled substances.  RTCB: PRN only for procedures. Nonopioid transferred 06/29/2020: Flexeril  Pharmacotherapy Assessment  Analgesic: NO MORE OPIOIDS!!! VERY High Risk. (See 06/29/2020 and 11/10/2020 note) MAX. MME: 200 mg/day MME/day: 0 mg/day.   Monitoring: [place] PMP: PDMP reviewed during this encounter.       Pharmacotherapy: No side-effects or adverse reactions reported. Compliance: No problems identified. Effectiveness: Clinically acceptable. Plan: Refer to "POC".  UDS:  Summary  Date Value Ref Range Status  04/06/2020 Note  Final    Comment:    ==================================================================== [employer] Select 13 (MW) ==================================================================== Test                             Result       Flag       Units  Drug Absent but Declared for Prescription  Verification   Oxycodone  Not Detected UNEXPECTED ng/mg creat ==================================================================== Test                      Result    Flag   Units      Ref Range   Creatinine              39               mg/dL      >=20 ==================================================================== Declared Medications:  The flagging and interpretation on this report are based on the  following declared medications.  Unexpected results may arise from  inaccuracies in the declared medications.   **Note: The testing scope of this panel includes these medications:   Oxycodone (Roxicodone)   **Note: The testing scope of this panel does not include the  following reported medications:   Albuterol (Ventolin HFA)  Atorvastatin (Lipitor)  Cephalexin (Keflex)  Cyclobenzaprine (Flexeril)  Insulin (Levemir)  Magnesium (Magonate)  Nitroglycerin (Nitrostat)  Sertraline (Zoloft)  Vitamin D3 ==================================================================== For clinical consultation, please call [phone]. ====================================================================     Laboratory Chemistry Profile   Renal Lab Results  Component Value Date   BUN 21 11/09/2020   CREATININE 1.37 (H) 11/09/2020   GFR 35.16 (L) 11/01/2020   GFRAA 49 (L) 02/20/2020   GFRNONAA 43 (L) 11/09/2020     Hepatic Lab Results  Component Value Date   AST 22 11/01/2020   ALT 17 11/01/2020   ALBUMIN 3.8 11/01/2020   ALKPHOS 135 (H) 11/01/2020   HCVAB 0.2 09/25/2018   AMYLASE 28 08/01/2015   LIPASE 31 10/08/2020   AMMONIA 46 (H) 08/13/2019     Electrolytes Lab Results  Component Value Date   NA 134 (L) 11/09/2020   K 4.3 11/09/2020   CL 106 11/09/2020   CALCIUM 8.5 (L) 11/09/2020   MG 1.9 09/29/2020   PHOS 3.3 08/27/2020     Bone Lab Results  Component Value Date   VD25OH 4.7 (L) 11/25/2018     Inflammation (CRP: Acute Phase) (ESR:  Chronic Phase) Lab Results  Component Value Date   CRP 14.8 (H) 07/18/2020   ESRSEDRATE 79 (H) 11/25/2018   LATICACIDVEN 2.2 ([place]) 09/27/2020       Note: Above Lab results reviewed.  Imaging  CT Abdomen Pelvis W Contrast CLINICAL DATA:  Acute pain due to trauma. Right lower quadrant abdominal pain and rib pain.  EXAM: CT ABDOMEN AND PELVIS WITH CONTRAST  TECHNIQUE: Multidetector CT imaging of the abdomen and pelvis was performed using the standard protocol following bolus administration of intravenous contrast.  CONTRAST:  54m OMNIPAQUE IOHEXOL 300 MG/ML  SOLN  COMPARISON:  December 05, 2019  FINDINGS: Lower chest: The lung bases are clear. The heart size is normal.  Hepatobiliary: The liver is cirrhotic. Status post cholecystectomy.There is no biliary ductal dilation.  Pancreas: Normal contours without ductal dilatation. No peripancreatic fluid collection.  Spleen: The spleen is enlarged.  Adrenals/Urinary Tract:  --Adrenal glands: Unremarkable.  --Right kidney/ureter: Right kidney is atrophic.  --Left kidney/ureter: No hydronephrosis or radiopaque kidney stones.  --Urinary bladder: There is diffuse bladder wall thickening.  Stomach/Bowel:  --Stomach/Duodenum: Esophageal varices are noted.  --Small bowel: Unremarkable.  --Colon: There is an above average amount of stool throughout the colon.  --Appendix: Normal.  Vascular/Lymphatic: Atherosclerotic calcification is present within the non-aneurysmal abdominal aorta, without hemodynamically significant stenosis. Portosystemic shunting is noted. Gastroesophageal varices are noted.  --No retroperitoneal lymphadenopathy.  --No mesenteric lymphadenopathy.  --No pelvic or inguinal lymphadenopathy.  Reproductive: Status post hysterectomy. No adnexal mass.  Other: No ascites or free air. The abdominal wall is normal.  Musculoskeletal. No acute displaced fractures.  IMPRESSION: 1. No acute  abdominopelvic abnormality. 2. Cirrhosis with stigmata of portal hypertension. 3. Diffuse bladder wall thickening. Recommend correlation with urinalysis to exclude cystitis.  Aortic Atherosclerosis (ICD10-I70.0).  Electronically Signed   By: Constance Holster M.D.   On: 11/09/2020 23:06 CT Cervical Spine Wo Contrast CLINICAL DATA:  Fall neck pain  EXAM: CT CERVICAL SPINE WITHOUT CONTRAST  TECHNIQUE: Multidetector CT imaging of the cervical spine was performed without intravenous contrast. Multiplanar CT image reconstructions were also generated.  COMPARISON:  None.  FINDINGS: Alignment: Physiologic  Skull base and vertebrae: Visualized skull base is intact. No atlanto-occipital dissociation. The vertebral body heights are well maintained. No fracture or pathologic osseous lesion seen.  Soft tissues and spinal canal: The visualized paraspinal soft tissues are unremarkable. No prevertebral soft tissue swelling is seen. The spinal canal is grossly unremarkable, no large epidural collection or significant canal narrowing.  Disc levels:  No significant canal or neural foraminal narrowing.  Upper chest: The lung apices are clear. Thoracic inlet is within normal limits.  Other: None  IMPRESSION: No acute fracture or malalignment.  Electronically Signed   By: Prudencio Pair M.D.   On: 11/09/2020 23:03 CT HEAD WO CONTRAST CLINICAL DATA:  Headache fall  EXAM: CT HEAD WITHOUT CONTRAST  TECHNIQUE: Contiguous axial images were obtained from the base of the skull through the vertex without intravenous contrast.  COMPARISON:  MRI 09/27/2020, CT 09/27/2020, 08/27/2020  FINDINGS: Brain: No acute territorial infarction, hemorrhage or intracranial mass. Mild white matter hypodensity consistent with chronic small vessel ischemic change. Mild atrophy. Stable ventricle size  Vascular: No hyperdense vessels.  Carotid vascular calcification  Skull: No  fracture  Sinuses/Orbits: Mucosal thickening in the sinuses. Left mastoid effusion and fluid in the left middle ear  Other: None  IMPRESSION: 1. No CT evidence for acute intracranial abnormality. 2. Atrophy and mild chronic small vessel ischemic change of the white matter. 3. Left mastoid effusion and fluid in the left middle ear.  Electronically Signed   By: Donavan Foil M.D.   On: 11/09/2020 18:32  Assessment  The primary encounter diagnosis was Chronic pain syndrome. Diagnoses of Chronic low back pain (Primary area of Pain) (Bilateral) (L>R) w/ sciatica (Bilateral), Chronic lower extremity pain (Secondary Area of Pain) (Bilateral) (L>R), Lumbar facet syndrome (Bilateral) (R>L), Fibromyalgia syndrome, Pharmacologic therapy, Narcotic abuse ([place]), and Medical non-compliance were also pertinent to this visit.  Plan of Care  Problem-specific:  No problem-specific Assessment & Plan notes found for this encounter.  Ms. NORALEE DUTKO has a current medication list which includes the following long-term medication(s): albuterol, cyclobenzaprine, levemir flextouch, metoprolol tartrate, nitroglycerin, oxycodone, pantoprazole, and sertraline.  Pharmacotherapy (Medications Ordered): Meds ordered this encounter  Medications  . oxyCODONE (OXY IR/ROXICODONE) 5 MG immediate release tablet    Sig: Take 1 tablet (5 mg total) by mouth 3 (three) times daily for 7 days, THEN 1 tablet (5 mg total) 2 (two) times daily for 7 days, THEN 1 tablet (5 mg total) daily for 7 days.    Dispense:  42 tablet    Refill:  0    Please provide patient with clear instructions on taper.   Orders:  No orders of the defined types were placed in this encounter.  Follow-up plan:   Return for interventional therapies only, no pharmacotherapy.  Interventional Therapies  Risk  Complexity Considerations:   ELIQUIS Anticoagulation (Stop: 3days  Re-start: 6hrs) due to A-Fib. Opioid misuse and non-compliance with  monitoring. Multiple abnormal UDS. CKD Uncontrolled type 2 diabetes with history of ketoacidosis CHF; congestive dilated cardiomyopathy History of CVA and TIAs History of non-ST elevated MI Cirrhosis of liver   Planned  Pending:   Pending further evaluation   Under consideration:   Diagnostic bilateral celiac plexus block  Diagnostic bilateral lumbar facet block  Diagnostic bilateral cervical facet block    Completed:   None procedures done since initial evaluation on 11/25/2018.   Therapeutic  Palliative (PRN) options:   None established    Recent Visits Date Type Provider Dept  10/10/20 Telemedicine Milinda Pointer, MD [facility]  Showing recent visits within past 90 days and meeting all other requirements Today's Visits Date Type Provider Dept  11/10/20 Telemedicine Milinda Pointer, MD [facility]  Showing today's visits and meeting all other requirements Future Appointments No visits were found meeting these conditions. Showing future appointments within next 90 days and meeting all other requirements  I discussed the assessment and treatment plan with the patient. The patient was provided an opportunity to ask questions and all were answered. The patient agreed with the plan and demonstrated an understanding of the instructions.  Patient advised to call back or seek an in-person evaluation if the symptoms or condition worsens.  Duration of encounter: 13 minutes.  Note by: Gaspar Cola, MD Date: 11/10/2020; Time: 11:30 AM

## 2020-11-10 ENCOUNTER — Ambulatory Visit: Payer: Medicare HMO | Attending: Pain Medicine | Admitting: Pain Medicine

## 2020-11-10 DIAGNOSIS — F1110 Opioid abuse, uncomplicated: Secondary | ICD-10-CM

## 2020-11-10 DIAGNOSIS — M5442 Lumbago with sciatica, left side: Secondary | ICD-10-CM

## 2020-11-10 DIAGNOSIS — M797 Fibromyalgia: Secondary | ICD-10-CM | POA: Diagnosis not present

## 2020-11-10 DIAGNOSIS — M79604 Pain in right leg: Secondary | ICD-10-CM | POA: Diagnosis not present

## 2020-11-10 DIAGNOSIS — M5441 Lumbago with sciatica, right side: Secondary | ICD-10-CM

## 2020-11-10 DIAGNOSIS — Z79899 Other long term (current) drug therapy: Secondary | ICD-10-CM

## 2020-11-10 DIAGNOSIS — M47816 Spondylosis without myelopathy or radiculopathy, lumbar region: Secondary | ICD-10-CM

## 2020-11-10 DIAGNOSIS — R69 Illness, unspecified: Secondary | ICD-10-CM | POA: Diagnosis not present

## 2020-11-10 DIAGNOSIS — Z9119 Patient's noncompliance with other medical treatment and regimen: Secondary | ICD-10-CM

## 2020-11-10 DIAGNOSIS — G894 Chronic pain syndrome: Secondary | ICD-10-CM | POA: Diagnosis not present

## 2020-11-10 DIAGNOSIS — M79605 Pain in left leg: Secondary | ICD-10-CM

## 2020-11-10 DIAGNOSIS — G8929 Other chronic pain: Secondary | ICD-10-CM

## 2020-11-10 DIAGNOSIS — Z91199 Patient's noncompliance with other medical treatment and regimen due to unspecified reason: Secondary | ICD-10-CM

## 2020-11-10 MED ORDER — OXYCODONE HCL 5 MG PO TABS
ORAL_TABLET | ORAL | 0 refills | Status: DC
Start: 2020-11-10 — End: 2020-12-17

## 2020-11-10 NOTE — Patient Instructions (Signed)
____________________________________________________________________________________________  Drug Holidays (Slow)  What is a "Drug Holiday"? Drug Holiday: is the name given to the period of time during which a patient stops taking a medication(s) for the purpose of eliminating tolerance to the drug.  Benefits . Improved effectiveness of opioids. . Decreased opioid dose needed to achieve benefits. . Improved pain with lesser dose.  What is tolerance? Tolerance: is the progressive decreased in effectiveness of a drug due to its repetitive use. With repetitive use, the body gets use to the medication and as a consequence, it loses its effectiveness. This is a common problem seen with opioid pain medications. As a result, a larger dose of the drug is needed to achieve the same effect that used to be obtained with a smaller dose.  How long should a "Drug Holiday" last? You should stay off of the pain medicine for at least 14 consecutive days. (2 weeks)  Should I stop the medicine "cold [place]"? No. You should always coordinate with your Pain Specialist so that he/she can provide you with the correct medication dose to make the transition as smoothly as possible.  How do I stop the medicine? Slowly. You will be instructed to decrease the daily amount of pills that you take by one (1) pill every seven (7) days. This is called a "slow downward taper" of your dose. For example: if you normally take four (4) pills per day, you will be asked to drop this dose to three (3) pills per day for seven (7) days, then to two (2) pills per day for seven (7) days, then to one (1) per day for seven (7) days, and at the end of those last seven (7) days, this is when the "Drug Holiday" would start.   Will I have withdrawals? By doing a "slow downward taper" like this one, it is unlikely that you will experience any significant withdrawal symptoms. Typically, what triggers withdrawals is the sudden stop of a high  dose opioid therapy. Withdrawals can usually be avoided by slowly decreasing the dose over a prolonged period of time. If you do not follow these instructions and decide to stop your medication abruptly, withdrawals may be possible.  What are withdrawals? Withdrawals: refers to the wide range of symptoms that occur after stopping or dramatically reducing opiate drugs after heavy and prolonged use. Withdrawal symptoms do not occur to patients that use low dose opioids, or those who take the medication sporadically. Contrary to benzodiazepine (example: Valium, Xanax, etc.) or alcohol withdrawals ("Delirium Tremens"), opioid withdrawals are not lethal. Withdrawals are the physical manifestation of the body getting rid of the excess receptors.  Expected Symptoms Early symptoms of withdrawal may include: . Agitation . Anxiety . Muscle aches . Increased tearing . Insomnia . Runny nose . Sweating . Yawning  Late symptoms of withdrawal may include: . Abdominal cramping . Diarrhea . Dilated pupils . Goose bumps . Nausea . Vomiting  Will I experience withdrawals? Due to the slow nature of the taper, it is very unlikely that you will experience any.  What is a slow taper? Taper: refers to the gradual decrease in dose.  (Last update: 03/30/2020) ____________________________________________________________________________________________

## 2020-11-29 ENCOUNTER — Ambulatory Visit: Payer: Medicare HMO | Admitting: Primary Care

## 2020-11-29 DIAGNOSIS — E162 Hypoglycemia, unspecified: Secondary | ICD-10-CM | POA: Diagnosis not present

## 2020-11-29 DIAGNOSIS — I4891 Unspecified atrial fibrillation: Secondary | ICD-10-CM | POA: Diagnosis not present

## 2020-12-07 ENCOUNTER — Telehealth: Payer: Self-pay
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
## 2020-12-07 NOTE — Telephone Encounter (Signed)
Patient is calling in stating that she has tested positive for covid, wondering if someone is able to give her a call to advise what she is supposed to do.
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
## 2020-12-08 NOTE — Telephone Encounter (Signed)
Noted, agree with advice given

## 2020-12-08 NOTE — Telephone Encounter (Signed)
Have called to follow up on patient. She is having increased SOB and "feeling bad" advised that she be evaluated at ED. Patient agreed and will have someone take now.

## 2020-12-12 ENCOUNTER — Emergency Department: Payer: Medicare HMO

## 2020-12-12 ENCOUNTER — Other Ambulatory Visit: Payer: Self-pay

## 2020-12-12 ENCOUNTER — Inpatient Hospital Stay
Admission: EM | Admit: 2020-12-12 | Discharge: 2020-12-17 | DRG: 637 | Disposition: A | Discharge status: Home Health | Payer: Medicare HMO | Attending: Internal Medicine | Admitting: Internal Medicine

## 2020-12-12 ENCOUNTER — Other Ambulatory Visit: Payer: Self-pay | Admitting: Primary Care

## 2020-12-12 ENCOUNTER — Ambulatory Visit: Payer: Medicare HMO | Attending: Pain Medicine | Admitting: Pain Medicine

## 2020-12-12 ENCOUNTER — Other Ambulatory Visit: Payer: Self-pay | Admitting: Pain Medicine

## 2020-12-12 DIAGNOSIS — R609 Edema, unspecified: Secondary | ICD-10-CM

## 2020-12-12 DIAGNOSIS — Z885 Allergy status to narcotic agent status: Secondary | ICD-10-CM

## 2020-12-12 DIAGNOSIS — G894 Chronic pain syndrome: Secondary | ICD-10-CM | POA: Diagnosis present

## 2020-12-12 DIAGNOSIS — Z20822 Contact with and (suspected) exposure to covid-19: Secondary | ICD-10-CM | POA: Diagnosis present

## 2020-12-12 DIAGNOSIS — N183 Chronic kidney disease, stage 3 unspecified: Secondary | ICD-10-CM | POA: Diagnosis present

## 2020-12-12 DIAGNOSIS — G9340 Encephalopathy, unspecified: Secondary | ICD-10-CM | POA: Diagnosis present

## 2020-12-12 DIAGNOSIS — M79602 Pain in left arm: Secondary | ICD-10-CM | POA: Diagnosis not present

## 2020-12-12 DIAGNOSIS — M545 Low back pain, unspecified: Secondary | ICD-10-CM | POA: Diagnosis present

## 2020-12-12 DIAGNOSIS — E039 Hypothyroidism, unspecified: Secondary | ICD-10-CM | POA: Diagnosis present

## 2020-12-12 DIAGNOSIS — E11649 Type 2 diabetes mellitus with hypoglycemia without coma: Secondary | ICD-10-CM | POA: Diagnosis not present

## 2020-12-12 DIAGNOSIS — Z91040 Latex allergy status: Secondary | ICD-10-CM

## 2020-12-12 DIAGNOSIS — F411 Generalized anxiety disorder: Secondary | ICD-10-CM | POA: Diagnosis present

## 2020-12-12 DIAGNOSIS — R413 Other amnesia: Secondary | ICD-10-CM | POA: Diagnosis not present

## 2020-12-12 DIAGNOSIS — Z794 Long term (current) use of insulin: Secondary | ICD-10-CM

## 2020-12-12 DIAGNOSIS — Z8249 Family history of ischemic heart disease and other diseases of the circulatory system: Secondary | ICD-10-CM

## 2020-12-12 DIAGNOSIS — Z79899 Other long term (current) drug therapy: Secondary | ICD-10-CM

## 2020-12-12 DIAGNOSIS — M7918 Myalgia, other site: Secondary | ICD-10-CM

## 2020-12-12 DIAGNOSIS — R0902 Hypoxemia: Secondary | ICD-10-CM | POA: Diagnosis not present

## 2020-12-12 DIAGNOSIS — Z8616 Personal history of COVID-19: Secondary | ICD-10-CM

## 2020-12-12 DIAGNOSIS — Z881 Allergy status to other antibiotic agents status: Secondary | ICD-10-CM

## 2020-12-12 DIAGNOSIS — I480 Paroxysmal atrial fibrillation: Secondary | ICD-10-CM | POA: Diagnosis present

## 2020-12-12 DIAGNOSIS — M546 Pain in thoracic spine: Secondary | ICD-10-CM | POA: Diagnosis present

## 2020-12-12 DIAGNOSIS — I951 Orthostatic hypotension: Secondary | ICD-10-CM | POA: Diagnosis present

## 2020-12-12 DIAGNOSIS — R296 Repeated falls: Secondary | ICD-10-CM

## 2020-12-12 DIAGNOSIS — R4182 Altered mental status, unspecified: Secondary | ICD-10-CM | POA: Diagnosis not present

## 2020-12-12 DIAGNOSIS — T8089XA Other complications following infusion, transfusion and therapeutic injection, initial encounter: Secondary | ICD-10-CM | POA: Diagnosis present

## 2020-12-12 DIAGNOSIS — R410 Disorientation, unspecified: Secondary | ICD-10-CM

## 2020-12-12 DIAGNOSIS — I69354 Hemiplegia and hemiparesis following cerebral infarction affecting left non-dominant side: Secondary | ICD-10-CM

## 2020-12-12 DIAGNOSIS — H7092 Unspecified mastoiditis, left ear: Secondary | ICD-10-CM | POA: Diagnosis not present

## 2020-12-12 DIAGNOSIS — I129 Hypertensive chronic kidney disease with stage 1 through stage 4 chronic kidney disease, or unspecified chronic kidney disease: Secondary | ICD-10-CM | POA: Diagnosis not present

## 2020-12-12 DIAGNOSIS — Z888 Allergy status to other drugs, medicaments and biological substances status: Secondary | ICD-10-CM | POA: Diagnosis not present

## 2020-12-12 DIAGNOSIS — I4891 Unspecified atrial fibrillation: Secondary | ICD-10-CM | POA: Diagnosis present

## 2020-12-12 DIAGNOSIS — M7989 Other specified soft tissue disorders: Secondary | ICD-10-CM | POA: Diagnosis not present

## 2020-12-12 DIAGNOSIS — F1990 Other psychoactive substance use, unspecified, uncomplicated: Secondary | ICD-10-CM | POA: Insufficient documentation

## 2020-12-12 DIAGNOSIS — Z887 Allergy status to serum and vaccine status: Secondary | ICD-10-CM | POA: Diagnosis not present

## 2020-12-12 DIAGNOSIS — R404 Transient alteration of awareness: Secondary | ICD-10-CM | POA: Diagnosis not present

## 2020-12-12 DIAGNOSIS — Z886 Allergy status to analgesic agent status: Secondary | ICD-10-CM

## 2020-12-12 DIAGNOSIS — R739 Hyperglycemia, unspecified: Secondary | ICD-10-CM | POA: Diagnosis not present

## 2020-12-12 DIAGNOSIS — K7460 Unspecified cirrhosis of liver: Secondary | ICD-10-CM | POA: Diagnosis present

## 2020-12-12 DIAGNOSIS — Z79891 Long term (current) use of opiate analgesic: Secondary | ICD-10-CM

## 2020-12-12 DIAGNOSIS — E161 Other hypoglycemia: Secondary | ICD-10-CM | POA: Diagnosis not present

## 2020-12-12 DIAGNOSIS — G928 Other toxic encephalopathy: Secondary | ICD-10-CM | POA: Diagnosis present

## 2020-12-12 DIAGNOSIS — I959 Hypotension, unspecified: Secondary | ICD-10-CM | POA: Diagnosis not present

## 2020-12-12 DIAGNOSIS — M797 Fibromyalgia: Secondary | ICD-10-CM

## 2020-12-12 DIAGNOSIS — E1165 Type 2 diabetes mellitus with hyperglycemia: Secondary | ICD-10-CM

## 2020-12-12 DIAGNOSIS — T50915A Adverse effect of multiple unspecified drugs, medicaments and biological substances, initial encounter: Secondary | ICD-10-CM | POA: Diagnosis present

## 2020-12-12 DIAGNOSIS — I4581 Long QT syndrome: Secondary | ICD-10-CM | POA: Diagnosis not present

## 2020-12-12 DIAGNOSIS — G319 Degenerative disease of nervous system, unspecified: Secondary | ICD-10-CM | POA: Diagnosis not present

## 2020-12-12 DIAGNOSIS — I9589 Other hypotension: Secondary | ICD-10-CM | POA: Diagnosis present

## 2020-12-12 DIAGNOSIS — R52 Pain, unspecified: Secondary | ICD-10-CM

## 2020-12-12 DIAGNOSIS — G8929 Other chronic pain: Secondary | ICD-10-CM | POA: Diagnosis present

## 2020-12-12 DIAGNOSIS — R9431 Abnormal electrocardiogram [ECG] [EKG]: Secondary | ICD-10-CM

## 2020-12-12 DIAGNOSIS — N1831 Chronic kidney disease, stage 3a: Secondary | ICD-10-CM | POA: Diagnosis present

## 2020-12-12 DIAGNOSIS — E785 Hyperlipidemia, unspecified: Secondary | ICD-10-CM | POA: Diagnosis present

## 2020-12-12 DIAGNOSIS — E1122 Type 2 diabetes mellitus with diabetic chronic kidney disease: Secondary | ICD-10-CM | POA: Diagnosis present

## 2020-12-12 DIAGNOSIS — Z87891 Personal history of nicotine dependence: Secondary | ICD-10-CM

## 2020-12-12 DIAGNOSIS — E162 Hypoglycemia, unspecified: Secondary | ICD-10-CM | POA: Diagnosis present

## 2020-12-12 DIAGNOSIS — Z87898 Personal history of other specified conditions: Secondary | ICD-10-CM | POA: Insufficient documentation

## 2020-12-12 DIAGNOSIS — Z8542 Personal history of malignant neoplasm of other parts of uterus: Secondary | ICD-10-CM

## 2020-12-12 DIAGNOSIS — Z9114 Patient's other noncompliance with medication regimen: Secondary | ICD-10-CM

## 2020-12-12 LAB — BLOOD GAS, ARTERIAL
Acid-base deficit: 2.7 mmol/L — ABNORMAL HIGH (ref 0.0–2.0)
Bicarbonate: 21.8 mmol/L (ref 20.0–28.0)
O2 Saturation: 96.2 %
Patient temperature: 37
pCO2 arterial: 36 mmHg (ref 32.0–48.0)
pH, Arterial: 7.39 (ref 7.350–7.450)
pO2, Arterial: 84 mmHg (ref 83.0–108.0)

## 2020-12-12 LAB — CBC WITH DIFFERENTIAL/PLATELET
Abs Immature Granulocytes: 0.04 10*3/uL (ref 0.00–0.07)
Basophils Absolute: 0 10*3/uL (ref 0.0–0.1)
Basophils Relative: 0 %
Eosinophils Absolute: 0 10*3/uL (ref 0.0–0.5)
Eosinophils Relative: 0 %
HCT: 35.1 % — ABNORMAL LOW (ref 36.0–46.0)
Hemoglobin: 10.8 g/dL — ABNORMAL LOW (ref 12.0–15.0)
Immature Granulocytes: 1 %
Lymphocytes Relative: 7 %
Lymphs Abs: 0.5 10*3/uL — ABNORMAL LOW (ref 0.7–4.0)
MCH: 26.4 pg (ref 26.0–34.0)
MCHC: 30.8 g/dL (ref 30.0–36.0)
MCV: 85.8 fL (ref 80.0–100.0)
Monocytes Absolute: 0.3 10*3/uL (ref 0.1–1.0)
Monocytes Relative: 5 %
Neutro Abs: 6.3 10*3/uL (ref 1.7–7.7)
Neutrophils Relative %: 87 %
Platelets: 128 10*3/uL — ABNORMAL LOW (ref 150–400)
RBC: 4.09 MIL/uL (ref 3.87–5.11)
RDW: 18.9 % — ABNORMAL HIGH (ref 11.5–15.5)
WBC: 7.2 10*3/uL (ref 4.0–10.5)
nRBC: 0 % (ref 0.0–0.2)

## 2020-12-12 LAB — COMPREHENSIVE METABOLIC PANEL
ALT: 21 U/L (ref 0–44)
AST: 33 U/L (ref 15–41)
Albumin: 3.2 g/dL — ABNORMAL LOW (ref 3.5–5.0)
Alkaline Phosphatase: 111 U/L (ref 38–126)
Anion gap: 6 (ref 5–15)
BUN: 20 mg/dL (ref 8–23)
CO2: 19 mmol/L — ABNORMAL LOW (ref 22–32)
Calcium: 7.5 mg/dL — ABNORMAL LOW (ref 8.9–10.3)
Chloride: 113 mmol/L — ABNORMAL HIGH (ref 98–111)
Creatinine, Ser: 1.02 mg/dL — ABNORMAL HIGH (ref 0.44–1.00)
GFR, Estimated: >60 mL/min (ref >60)
Glucose, Bld: 234 mg/dL — ABNORMAL HIGH (ref 70–99)
Potassium: 3.3 mmol/L — ABNORMAL LOW (ref 3.5–5.1)
Sodium: 138 mmol/L (ref 135–145)
Total Bilirubin: 0.8 mg/dL (ref 0.3–1.2)
Total Protein: 6.3 g/dL — ABNORMAL LOW (ref 6.5–8.1)

## 2020-12-12 LAB — CBC
HCT: 30.3 % — ABNORMAL LOW (ref 36.0–46.0)
Hemoglobin: 9.5 g/dL — ABNORMAL LOW (ref 12.0–15.0)
MCH: 26.8 pg (ref 26.0–34.0)
MCHC: 31.4 g/dL (ref 30.0–36.0)
MCV: 85.4 fL (ref 80.0–100.0)
Platelets: 133 10*3/uL — ABNORMAL LOW (ref 150–400)
RBC: 3.55 MIL/uL — ABNORMAL LOW (ref 3.87–5.11)
RDW: 18.8 % — ABNORMAL HIGH (ref 11.5–15.5)
WBC: 6.9 10*3/uL (ref 4.0–10.5)
nRBC: 0 % (ref 0.0–0.2)

## 2020-12-12 LAB — URINALYSIS, COMPLETE (UACMP) WITH MICROSCOPIC
Bacteria, UA: NONE SEEN
Glucose, UA: NEGATIVE mg/dL
Ketones, ur: NEGATIVE mg/dL
Nitrite: NEGATIVE
Protein, ur: 30 mg/dL — AB
Specific Gravity, Urine: 1.016 (ref 1.005–1.030)
WBC, UA: >50 WBC/hpf — ABNORMAL HIGH (ref 0–5)
pH: 5 (ref 5.0–8.0)

## 2020-12-12 LAB — CBG MONITORING, ED
Glucose-Capillary: 53 mg/dL — ABNORMAL LOW (ref 70–99)
Glucose-Capillary: 115 mg/dL — ABNORMAL HIGH (ref 70–99)
Glucose-Capillary: 15 mg/dL — CL (ref 70–99)
Glucose-Capillary: 105 mg/dL — ABNORMAL HIGH (ref 70–99)
Glucose-Capillary: 111 mg/dL — ABNORMAL HIGH (ref 70–99)

## 2020-12-12 LAB — URINE DRUG SCREEN, QUALITATIVE (ARMC ONLY)
Amphetamines, Ur Screen: NOT DETECTED
Barbiturates, Ur Screen: NOT DETECTED
Benzodiazepine, Ur Scrn: NOT DETECTED
Cannabinoid 50 Ng, Ur ~~LOC~~: NOT DETECTED
Cocaine Metabolite,Ur ~~LOC~~: NOT DETECTED
MDMA (Ecstasy)Ur Screen: NOT DETECTED
Methadone Scn, Ur: NOT DETECTED
Opiate, Ur Screen: NOT DETECTED
Phencyclidine (PCP) Ur S: NOT DETECTED
Tricyclic, Ur Screen: POSITIVE — AB

## 2020-12-12 LAB — TSH: TSH: 2.886 u[IU]/mL (ref 0.350–4.500)

## 2020-12-12 LAB — TROPONIN I (HIGH SENSITIVITY)
Troponin I (High Sensitivity): 10 ng/L (ref <18)
Troponin I (High Sensitivity): 14 ng/L (ref <18)

## 2020-12-12 LAB — AMMONIA: Ammonia: 29 umol/L (ref 9–35)

## 2020-12-12 LAB — CREATININE, SERUM
Creatinine, Ser: 1.07 mg/dL — ABNORMAL HIGH (ref 0.44–1.00)
GFR, Estimated: 57 mL/min — ABNORMAL LOW (ref >60)

## 2020-12-12 MED ORDER — SODIUM CHLORIDE 0.9% FLUSH
3.0000 mL | Freq: Two times a day (BID) | INTRAVENOUS | Status: DC
  Administered 2020-12-12: 3 mL via INTRAVENOUS

## 2020-12-12 MED ORDER — DEXTROSE 50 % IV SOLN
12.5000 g | INTRAVENOUS | Status: AC
  Administered 2020-12-12: 12.5 g via INTRAVENOUS
  Filled 2020-12-12: qty 50

## 2020-12-12 MED ORDER — DEXTROSE 50 % IV SOLN
INTRAVENOUS | Status: AC
  Administered 2020-12-12: 50 mL via INTRAVENOUS
  Filled 2020-12-12: qty 50

## 2020-12-12 MED ORDER — DEXTROSE-NACL 5-0.9 % IV SOLN: INTRAVENOUS | Status: DC

## 2020-12-12 MED ORDER — LACTATED RINGERS IV BOLUS
1000.0000 mL | Freq: Once | INTRAVENOUS | Status: AC
  Administered 2020-12-12: 1000 mL via INTRAVENOUS

## 2020-12-12 MED ORDER — DEXTROSE 50 % IV SOLN: 1.0000 | Freq: Once | INTRAVENOUS | Status: AC

## 2020-12-12 MED ORDER — METHOCARBAMOL 500 MG PO TABS
500.0000 mg | ORAL_TABLET | Freq: Once | ORAL | Status: AC
  Administered 2020-12-12: 500 mg via ORAL
  Filled 2020-12-12: qty 1

## 2020-12-12 MED ORDER — ACETAMINOPHEN 325 MG PO TABS
650.0000 mg | ORAL_TABLET | Freq: Four times a day (QID) | ORAL | Status: DC | PRN
  Administered 2020-12-16: 21:00:00 650 mg via ORAL
  Filled 2020-12-12 (×3): qty 2

## 2020-12-12 MED ORDER — SODIUM CHLORIDE 0.9 % IV SOLN
3.0000 g | Freq: Once | INTRAVENOUS | Status: AC
  Administered 2020-12-12: 3 g via INTRAVENOUS
  Filled 2020-12-12: qty 8

## 2020-12-12 MED ORDER — NALOXONE HCL 2 MG/2ML IJ SOSY
0.4000 mg | PREFILLED_SYRINGE | INTRAMUSCULAR | Status: DC | PRN
  Filled 2020-12-12: qty 2

## 2020-12-12 MED ORDER — ACETAMINOPHEN 500 MG PO TABS
1000.0000 mg | ORAL_TABLET | Freq: Once | ORAL | Status: AC
  Administered 2020-12-12: 1000 mg via ORAL
  Filled 2020-12-12: qty 2

## 2020-12-12 MED ORDER — OXYCODONE HCL 5 MG PO TABS
5.0000 mg | ORAL_TABLET | Freq: Once | ORAL | Status: AC
  Administered 2020-12-12: 5 mg via ORAL
  Filled 2020-12-12: qty 1

## 2020-12-12 MED ORDER — DEXTROSE 50 % IV SOLN
1.0000 | Freq: Once | INTRAVENOUS | Status: AC
  Administered 2020-12-12: 50 mL via INTRAVENOUS
  Filled 2020-12-12: qty 50

## 2020-12-12 MED ORDER — MAGNESIUM SULFATE 2 GM/50ML IV SOLN
2.0000 g | Freq: Once | INTRAVENOUS | Status: AC
  Administered 2020-12-12: 2 g via INTRAVENOUS
  Filled 2020-12-12: qty 50

## 2020-12-12 MED ORDER — HEPARIN SODIUM (PORCINE) 5000 UNIT/ML IJ SOLN
5000.0000 [IU] | Freq: Three times a day (TID) | INTRAMUSCULAR | Status: DC
  Administered 2020-12-12 – 2020-12-15 (×8): 5000 [IU] via SUBCUTANEOUS
  Filled 2020-12-12 (×8): qty 1

## 2020-12-12 MED ORDER — ACETAMINOPHEN 650 MG RE SUPP: 650.0000 mg | Freq: Four times a day (QID) | RECTAL | Status: DC | PRN

## 2020-12-12 NOTE — H&P (Signed)
History and Physical    [name] FWY:[id number] DOB: 04/24/1956 DOA: 12/12/2020  PCP: [name], NP  Patient coming from: home  I have personally briefly reviewed patient's old medical records in [place]  Chief Complaint: change in mental status  HPI: [name] is a 65 y.o. female with medical history significant of  history of hypertension, hyperlipidemia, atrial fibrillation not on anticoagulation due to frequent fall, diabetes, cirrhosis, and chronic orthostatic hypotension on midodrine, history of mild covid infection s/p recovery,who presents to ED BIBEMS due to excessive lethargy/change in mental status. ON evaluation in the field patient was found to be severely hypoglycemic and was started on D10 and transported to ED. Patient states she has no recollection of what occurred prior to presenting to ED. She does note chronic back pain, but notes no f/c/n/v/d/or abdominal pain or cough or sob.  IN ed on initial evaluation neuro exam non-focal other than for noted encephalopathy fs ranged from 50;s-200s after treatment. Currently on questioning does not know her name and is not oriented. Not able to give further history  ED Course:  Afeb,  bp 129/88, hr98, rr 15 Labs: wbc:7.2, hgb 10.8, plt128 Na:138, K 3.3,  Cl 113, co219, gluc 234, cr 1.02 at baseline ce 10 UDS: TCA Ekg:nsr, prolonged qt543 YD:XAJOINOMV,EHMCNOB,SJGGEZ for mastoiditis,lr, robaxin,magnesium Review of Systems: As per HPI otherwise 10 point review of systems negative.   Past Medical History:  Diagnosis Date  . Acute encephalopathy 05/22/2018  . Allergy   . Anginal pain ([place])   . Anxiety   . Ascites   . C. difficile colitis 07/10/2015  . Cancer (HCC)    HX OF CANCER OF UTERUS   . Cirrhosis of liver not due to alcohol ([place]) 2016  . Degenerative disk disease   . Diverticulitis   . Gastroparesis   . GERD (gastroesophageal reflux disease)   . History of hiatal hernia   . Hypertension    . Hypothyroid   . Hypothyroidism due to amiodarone   . Ileus ([place]) 08/01/2015  . Intussusception intestine ([place]) 05/2015  . Orthostatic hypotension   . PAF (paroxysmal atrial fibrillation) ([place]) 03/2015   a. new onset 03/2015 in setting of intractable N/V; b. on Eliquis 5 mg bid; c. CHADSVASc 4 (DM, TIA x 2, female)  . Pancreatitis   . Pneumonia 11/14/2015  . Pneumonia 07/2020  . Right ureteral stone 07/14/2016  . Sepsis ([place]) 12/15/2017  . Sick sinus syndrome ([place])   . Stomach ulcer   . Stroke [facility])    with minimal left sided weakness  . Syncope 01/2015  . Syncope due to orthostatic hypotension 05/18/2015  . Tachyarrhythmia 01/10/2016  . TIA (transient ischemic attack) 02/2015  . Type 1 diabetes ([place])    on levemir  . UTERINE CANCER, HX OF 03/27/2007   Qualifier: Diagnosis of  By: Maxie Better FNP, Rosalita Levan   . UTI (urinary tract infection) 05/22/2018    Past Surgical History:  Procedure Laterality Date  . ABDOMINAL HYSTERECTOMY    . CARDIAC CATHETERIZATION N/A 01/12/2016   Procedure: Left Heart Cath and Coronary Angiography;  Surgeon: Wellington Hampshire, MD;  Location: [facility];  Service: Cardiovascular;  Laterality: N/A;  . CHOLECYSTECTOMY    . CYSTOSCOPY W/ RETROGRADES Bilateral 12/11/2019   Procedure: CYSTOSCOPY WITH RETROGRADE PYELOGRAM;  Surgeon: Billey Co, MD;  Location: [facility]S;  Service: Urology;  Laterality: Bilateral;  . CYSTOSCOPY WITH BIOPSY N/A 12/11/2019   Procedure: CYSTOSCOPY WITH  BLADDER BIOPSY;  Surgeon: Billey Co, MD;  Location: [facility];  Service: Urology;  Laterality: N/A;  . CYSTOSCOPY WITH FULGERATION N/A 12/11/2019   Procedure: CYSTOSCOPY WITH FULGERATION;  Surgeon: Billey Co, MD;  Location: [facility];  Service: Urology;  Laterality: N/A;  . CYSTOSCOPY WITH STENT PLACEMENT Right 12/11/2019   Procedure: CYSTOSCOPY WITH STENT PLACEMENT;  Surgeon: Billey Co, MD;  Location: [facility];  Service: Urology;  Laterality: Right;  . CYSTOSCOPY  WITH URETHRAL DILATATION Right 12/11/2019   Procedure: CYSTOSCOPY WITH URETERAL DILATATION;  Surgeon: Billey Co, MD;  Location: [facility];  Service: Urology;  Laterality: Right;  . CYSTOSCOPY/URETEROSCOPY/HOLMIUM LASER Right 07/14/2016   Procedure: CYSTOSCOPY/URETEROSCOPY/HOLMIUM LASER;  Surgeon: Alexis Frock, MD;  Location: [facility];  Service: Urology;  Laterality: Right;  . ESOPHAGOGASTRODUODENOSCOPY N/A 04/04/2015   Procedure: ESOPHAGOGASTRODUODENOSCOPY (EGD);  Surgeon: Hulen Luster, MD;  Location: [facility] ENDOSCOPY;  Service: Endoscopy;  Laterality: N/A;  . ESOPHAGOGASTRODUODENOSCOPY N/A 12/28/2017   Procedure: ESOPHAGOGASTRODUODENOSCOPY (EGD);  Surgeon: Lin Landsman, MD;  Location: [facility] END[facility]COPY;  Service: Gastroenterology;  Laterality: N/A;  . ESOPHAGOGASTRODUODENOSCOPY (EGD) WITH PROPOFOL N/A 01/18/2016   Procedure: ESOPHAGOGASTRODUODENOSCOPY (EGD) WITH PROPOFOL;  Surgeon: Lucilla Lame, MD;  Location: [facility] ENDOSCOPY;  Service: Endoscopy;  Laterality: N/A;  . FLEXIBLE SIGMOIDOSCOPY N/A 01/18/2016   Procedure: FLEXIBLE SIGMOIDOSCOPY;  Surgeon: Lucilla Lame, MD;  Location: [facility] ENDOSCOPY;  Service: Endoscopy;  Laterality: N/A;  . HERNIA REPAIR    . URETEROSCOPY Right 12/11/2019   Procedure: Christen Butter;  Surgeon: Billey Co, MD;  Location: [facility];  Service: Urology;  Laterality: Right;     reports that she has quit smoking. Her smoking use included cigarettes. She has never used smokeless tobacco. She reports that she does not drink alcohol and does not use drugs.  Allergies  Allergen Reactions  . Aspirin Rash  . Codeine Sulfate Rash  . Erythromycin Rash    Reaction:  Fever   . Prednisone Swelling  . Rosiglitazone Maleate Swelling  . Tetanus-Diphtheria Toxoids Td Rash and Other (See Comments)    Reaction:  Fever     Family History  Problem Relation Age of Onset  . Hypertension Mother   . CAD Sister   . Heart attack Sister        Deceased 2014-11-06  . CAD  Brother    Prior to Admission medications   Medication Sig Start Date End Date Taking? Authorizing Provider  albuterol (VENTOLIN HFA) 108 (90 Base) MCG/ACT inhaler Inhale 2 puffs into the lungs every 4 (four) hours as needed for wheezing or shortness of breath. 07/18/20   Elodia Florence., MD  atorvastatin (LIPITOR) 40 MG tablet Take 40 mg by mouth at bedtime.    [provider]  cyclobenzaprine (FLEXERIL) 10 MG tablet TAKE 1 TABLET BY MOUTH 3 TIMES DAILY AS NEEDED FOR MUSCLE SPASMS. MUST LASTS 30 DAYS. 12/12/20   Lesleigh Noe, MD  glucose blood (ACCU-CHEK AVIVA PLUS) test strip Use as instructed to test blood sugar up to 4 times daily 10/09/19   [name], NP  insulin detemir (LEVEMIR FLEXTOUCH) 100 UNIT/ML FlexPen Inject 32 Units into the skin 2 (two) times daily. 10/27/20   [name], NP  lidocaine (LIDODERM) 5 % Place 1 patch onto the skin every 12 (twelve) hours. Remove & Discard patch within 12 hours or as directed by MD 11/09/20 11/09/21  Blake Divine, MD  Melatonin 10 MG CAPS Take 1 tablet by mouth at bedtime.  [provider]  metoprolol tartrate (LOPRESSOR) 25 MG tablet Take 0.5 tablets (12.5 mg total) by mouth 2 (two) times daily. This is a decrease from your previous 25 mg twice daily. 09/29/20   Enzo Bi, MD  midodrine (PROAMATINE) 5 MG tablet Take 1 tablet (5 mg total) by mouth 3 (three) times daily with meals. To help prevent blood pressure dropping when you stand. 09/29/20 12/28/20  Enzo Bi, MD  nitroGLYCERIN (NITROSTAT) 0.3 MG SL tablet Place 1 tablet (0.3 mg total) under the tongue every 5 (five) minutes as needed for chest pain. 07/24/19 07/23/20  Nena Polio, MD  oxyCODONE (OXY IR/ROXICODONE) 5 MG immediate release tablet Take 1 tablet (5 mg total) by mouth 3 (three) times daily for 7 days, THEN 1 tablet (5 mg total) 2 (two) times daily for 7 days, THEN 1 tablet (5 mg total) daily for 7 days. 11/10/20 12/01/20  Milinda Pointer, MD   pantoprazole (PROTONIX) 40 MG tablet Take 1 tablet (40 mg total) by mouth daily. 07/18/20 08/17/20  Elodia Florence., MD  sertraline (ZOLOFT) 50 MG tablet Take 1 tablet (50 mg total) by mouth daily. For anxiety and depression. 10/27/20   [name], NP  Spacer/Aero-Hold Chamber Bags MISC Use spacer with inhaler when you have shortness of breath 07/18/20   Elodia Florence., MD  TECHLITE PEN NEEDLES 32G X 6 MM MISC USE TWICE DAILY AS DIRECTED 09/12/20   [name], NP    Physical Exam: Vitals:   12/12/20 1730 12/12/20 1800 12/12/20 1830 12/12/20 1900  BP: (!) 177/83 (!) 175/88 (!) 175/81 (!) 154/89  Pulse: 97 99 93 95  Resp: 13 20 14 20   Temp:      TempSrc:      SpO2: 99% 100% 100% 100%  Weight:      Height:         Vitals:   12/12/20 1730 12/12/20 1800 12/12/20 1830 12/12/20 1900  BP: (!) 177/83 (!) 175/88 (!) 175/81 (!) 154/89  Pulse: 97 99 93 95  Resp: 13 20 14 20   Temp:      TempSrc:      SpO2: 99% 100% 100% 100%  Weight:      Height:      Constitutional: NAD, calm, comfortable Eyes: PERRL, lids and conjunctivae normal ENMT: Mucous membranes are moist. Posterior pharynx clear of any exudate or lesions.Normal dentition.  Neck: normal, supple, no masses, no thyromegaly Respiratory: clear to auscultation bilaterally, no wheezing, no crackles. Normal respiratory effort. No accessory muscle use.  Cardiovascular: Regular rate and rhythm, no murmurs / rubs / gallops. No extremity edema. 2+ pedal pulses. No carotid bruits.  Abdomen: no tenderness, no masses palpated. No hepatosplenomegaly. Bowel sounds positive.  Musculoskeletal: no clubbing / cyanosis. No joint deformity upper and lower extremities. Good ROM, no contractures. Normal muscle tone.  Skin: no rashes, lesions, ulcers. No induration Neurologic: CN 2-12 grossly intact. Sensation intact, . Strength 5/5 in all 4.  Psychiatric: Normal judgment and insight.somnolent not oriented Normal mood.    Labs  on Admission: I have personally reviewed following labs and imaging studies  CBC: Recent Labs  Lab 12/12/20 1616  WBC 7.2  NEUTROABS 6.3  HGB 10.8*  HCT 35.1*  MCV 85.8  PLT 540*   Basic Metabolic Panel: Recent Labs  Lab 12/12/20 1616  NA 138  K 3.3*  CL 113*  CO2 19*  GLUCOSE 234*  BUN 20  CREATININE 1.02*  CALCIUM 7.5*  GFR: Estimated Creatinine Clearance: 52.9 mL/min (A) (by C-G formula based on SCr of 1.02 mg/dL (H)). Liver Function Tests: Recent Labs  Lab 12/12/20 1616  AST 33  ALT 21  ALKPHOS 111  BILITOT 0.8  PROT 6.3*  ALBUMIN 3.2*   No results for input(s): LIPASE, AMYLASE in the last 168 hours. No results for input(s): AMMONIA in the last 168 hours. Coagulation Profile: No results for input(s): INR, PROTIME in the last 168 hours. Cardiac Enzymes: No results for input(s): CKTOTAL, CKMB, CKMBINDEX, TROPONINI in the last 168 hours. BNP (last 3 results) No results for input(s): PROBNP in the last 8760 hours. HbA1C: No results for input(s): HGBA1C in the last 72 hours. CBG: Recent Labs  Lab 12/12/20 1638 12/12/20 1744 12/12/20 2014  GLUCAP 53* 115* 15*   Lipid Profile: No results for input(s): CHOL, HDL, LDLCALC, TRIG, CHOLHDL, LDLDIRECT in the last 72 hours. Thyroid Function Tests: No results for input(s): TSH, T4TOTAL, FREET4, T3FREE, THYROIDAB in the last 72 hours. Anemia Panel: No results for input(s): VITAMINB12, FOLATE, FERRITIN, TIBC, IRON, RETICCTPCT in the last 72 hours. Urine analysis:    Component Value Date/Time   COLORURINE Light Red (A) 11/01/2020 1322   APPEARANCEUR Cloudy (A) 11/01/2020 1322   APPEARANCEUR Cloudy (A) 12/02/2019 1259   LABSPEC 1.025 11/01/2020 1322   LABSPEC 1.033 10/08/2014 0144   PHURINE 6.5 11/01/2020 1322   GLUCOSEU >=1000 (A) 11/01/2020 1322   HGBUR LARGE (A) 11/01/2020 1322   BILIRUBINUR NEGATIVE 11/01/2020 1322   BILIRUBINUR Negative 12/02/2019 1259   BILIRUBINUR Negative 10/08/2014 0144    KETONESUR NEGATIVE 11/01/2020 1322   PROTEINUR 100 (A) 10/08/2020 1031   UROBILINOGEN 1.0 11/01/2020 1322   NITRITE NEGATIVE 11/01/2020 1322   LEUKOCYTESUR TRACE (A) 11/01/2020 1322   LEUKOCYTESUR Negative 10/08/2014 0144    Radiological Exams on Admission: CT Head Wo Contrast  Result Date: 12/12/2020 CLINICAL DATA:  65 year old female with history of syncope and confusion on Eliquis. Evaluate for intracranial hemorrhage. EXAM: CT HEAD WITHOUT CONTRAST TECHNIQUE: Contiguous axial images were obtained from the base of the skull through the vertex without intravenous contrast. COMPARISON:  Head CT 11/09/2020. FINDINGS: Brain: Mild cerebral atrophy. Patchy and confluent areas of decreased attenuation are noted throughout the deep and periventricular white matter of the cerebral hemispheres bilaterally, compatible with chronic microvascular ischemic disease. No evidence of acute infarction, hemorrhage, hydrocephalus, extra-axial collection or mass lesion/mass effect. Vascular: No hyperdense vessel or unexpected calcification. Skull: Normal. Negative for fracture or focal lesion. Sinuses/Orbits: Paranasal sinuses are clear. Right mastoid is well pneumatized. Large left mastoid effusion. Other: None. IMPRESSION: 1. No acute intracranial abnormality. Specifically, no evidence of acute intracranial hemorrhage. 2. Large left mastoid effusion. Clinical correlation for signs and symptoms of mastoiditis is recommended. 3. Mild cerebral atrophy with chronic microvascular ischemic changes in the cerebral white matter, as above. Electronically Signed   By: Vinnie Langton M.D.   On: 12/12/2020 16:45   MR BRAIN WO CONTRAST  Result Date: 12/12/2020 CLINICAL DATA:  Initial evaluation for memory loss, mental status change. EXAM: MRI HEAD WITHOUT CONTRAST TECHNIQUE: Multiplanar, multiecho pulse sequences of the brain and surrounding structures were obtained without intravenous contrast. COMPARISON:  Prior CT from earlier  the same day as well as previous MRI from 09/27/2020. FINDINGS: Brain: Mild age-related cerebral volume loss, stable. Patchy T2/FLAIR hyperintensity within the periventricular and deep white matter both cerebral hemispheres as well as the pons, most consistent with chronic small vessel ischemic disease, also stable. Superimposed probable small  remote lacunar infarct at the central pons again noted. Small remote right cerebellar and left occipital cortical infarcts again noted as well, also stable. No abnormal foci of restricted diffusion to suggest acute or subacute ischemia. Gray-white matter differentiation otherwise maintained. No other areas of encephalomalacia to suggest chronic cortical infarction. No acute or chronic intracranial hemorrhage. No mass lesion, mass effect, or midline shift. No hydrocephalus or extra-axial fluid collection. Pituitary gland and suprasellar region normal. Vascular: Mildly increased FLAIR hyperintensity within the left transverse sinus without T1 correlate, likely reflecting slow flow. Major intracranial vascular flow voids are otherwise maintained. Skull and upper cervical spine: Craniocervical junction within normal limits. Bone marrow signal intensity normal. No scalp soft tissue abnormality. Sinuses/Orbits: Globes and orbital soft tissues within normal limits. Mild scattered mucosal thickening noted within the ethmoidal air cells. Paranasal sinuses are otherwise clear. Chronic left mastoid and middle ear effusion, stable from previous. Visualized nasopharynx within normal limits. Other: None. IMPRESSION: 1. No acute intracranial abnormality. 2. Age-related cerebral atrophy with mild-to-moderate chronic microvascular ischemic disease, with a few scattered small remote ischemic infarcts as above, stable. 3. Chronic left mastoid and middle ear effusion, stable from previous. Electronically Signed   By: Jeannine Boga M.D.   On: 12/12/2020 19:49   DG Chest Portable 1  View  Result Date: 12/12/2020 CLINICAL DATA:  Hyperglycemia.  Altered mental status. EXAM: PORTABLE CHEST 1 VIEW COMPARISON:  11/01/2020 FINDINGS: Shallow inspiration. Heart size and pulmonary vascularity are probably normal for inspiratory effort. No airspace disease or consolidation in the lungs. Vascular crowding. No pleural effusions. No pneumothorax. Mediastinal contours appear intact. IMPRESSION: Shallow inspiration.  No evidence of active pulmonary disease. Electronically Signed   By: Lucienne Capers M.D.   On: 12/12/2020 19:59    EKG: Independently reviewed. See above Assessment/Plan  Acute Encephalopathy   -ct head/MRI negative for CVA   -most likely toxic metabolic/ ?TCA/pain medication/sleep medications (polypharmacy) as well as refractory hypoglycemia  -hold sedative medications  - place on neuro checks  -consider neuro consult if patient change in mental status does not improve with stabilization of blood sugars and holding sedating medications  DMII with Hypoglycemia -hold hypoglycemic agents -place on q2h fs -start D5NS   Prolonged QT -s/p mag in ed -repeat ekg now  -monitor on tele   atrial fibrillation -currently in sinus - not on anticoagulation due to frequent fall -continue bb as able   Hypertension -resume  Metoprolol  Orthostatic hypotension  -prn midodrine   hyperlipidemia - continue statin    Cirrhosis -no active issues -ammonia pending   chronic pain  -hold medication overnight  -resumes able able in am   DVT prophylaxis: heparin  Code Status: Full Family Communication: n/a Disposition Plan: patient  expected to be admitted greater than 2 midnights Consults called: n.a Admission status:inpatient  Clance Boll MD [facility]   If 7PM-7AM, please contact night-coverage [email or web address] Password [facility]  12/12/2020, 8:29 PM

## 2020-12-12 NOTE — Progress Notes (Signed)
Unsuccessful attempt to contact patient for Virtual Visit (Pain Management Telehealth)   Patient provided contact information:  [phone] (home); [phone] (mobile); (Preferred) [phone] No e-mail address on record   Pre-screening:  Our staff was successful in contacting [name] using the above provided information.   I unsuccessfully attempted to make contact with [name] on 12/12/2020 via telephone. I was unable to complete the virtual encounter due to the patient being asleep when I first called and her family member being unable to wake her up.  The second time I called my call went directly to voicemail.. I was able to leave a message.  Pharmacotherapy Assessment  Analgesic: NO MORE OPIOIDS!!! VERY High Risk. (See 06/29/2020 and 11/10/2020 note) MAX. MME: 200 mg/day MME/day: 0 mg/day.   Follow-up plan:   Reschedule Visit.     Interventional Therapies  Risk  Complexity Considerations:   ELIQUIS Anticoagulation (Stop: 3days  Re-start: 6hrs) due to A-Fib. Opioid misuse and non-compliance with monitoring. Multiple abnormal UDS. CKD Uncontrolled type 2 diabetes with history of ketoacidosis CHF; congestive dilated cardiomyopathy History of CVA and TIAs History of non-ST elevated MI Cirrhosis of liver   Planned  Pending:   Opioids permanently discontinued.   Under consideration:   Diagnostic bilateral celiac plexus block  Diagnostic bilateral lumbar facet block  Diagnostic bilateral cervical facet block    Completed:   None procedures done since initial evaluation on 11/25/2018.   Therapeutic  Palliative (PRN) options:   None established    Recent Visits Date Type Provider Dept  11/10/20 Telemedicine Milinda Pointer, MD [facility]  10/10/20 Telemedicine Milinda Pointer, MD [facility]  Showing recent visits within past 90 days and meeting all other requirements Today's Visits Date Type Provider Dept  12/12/20 Telemedicine  Milinda Pointer, MD [facility]  Showing today's visits and meeting all other requirements Future Appointments No visits were found meeting these conditions. Showing future appointments within next 90 days and meeting all other requirements   Note by: Gaspar Cola, MD Date: 12/12/2020; Time: 4:04 PM

## 2020-12-12 NOTE — Progress Notes (Signed)
Today December 12, 2020 I called the patient for a requested virtual visit.  The phone call was picked up by a family member and when I asked for the patient, they went on to wake her up so as to answer the call.  There were unable to wake her up despite the fact that I could hear him call her name several times, softly, and to see if he was standing over her.  He came back and indicated that she was unavailable and asked what the call was for, once I informed him that the call was the request by the patient for a virtual visit, he went back to the patient and repeated the same process again being unable to wake her up.  He then told me that she would have to call me back, at which time I asked him if she was okay.  He indicated that she was sound asleep and he could not wake her up.  He also indicated that he thought that he may be secondary to the fact that she had not been able to sleep well for couple days and now she was in a profound sleep.  I reviewed the patient's PMP, and she has not received any controlled substances used to induce sleep.  At this point, I told him to have her call the office and reschedule the appointment.  The patient had requested this appointment so as to have me begin prescribing oxycodone again for her.  Apparently, she was under the impression that we were just going to do a "Drug Holiday".  However, I was very clear to her about the reason why we were going to taper down the opioids.  The reason being that she has managed to avoid coming to her appointments for medication management and compliance evaluations.  Please read my 11/10/2020 note.  The patient wanted to have a virtual appointment today to restart her opioid analgesics.  She again indicated to the scheduling staff that she cannot make it to a face-to-face visit because she is Covid (+).  Unfortunately for her, she has used this excuse 1 to many times.  On 11/10/2020 I explained to the patient that we will no longer be  prescribing any opioid analgesics since she has not been compliant with keeping her monitoring appointments.  The fact of the matter is that we will not be offering this option any longer to this patient.  For further details please see the 06/29/2020 and 11/10/2020 notes.  She already had two abnormal urine drug screening test, one on 02/04/2020 and the second one on 04/06/2020.   She has already switched her face-to-face visits to virtual visits on 05/04/2020, 06/07/2020, 06/29/2020, 08/10/2020, 10/10/2020, 10/12/2020, and 11/10/2020.  She seems to forget that we keep track of these and she has used the COVID-19 excuse on 06/29/2020, 08/10/2020, 10/10/2020, 10/12/2020, 11/10/2020 and again now on 12/12/2020.    I really doubt that she has had Covid-19 on 6 different times since 06/29/2020.

## 2020-12-12 NOTE — ED Triage Notes (Signed)
Pt presents to the [facility] via EMS from home with c/o hypoglycemia. EMS states that they were notified by pt's husband after pt began to have altered mental status. Pt's glucose was 64 upon arrival to EMS. Pt received D10 en route.

## 2020-12-12 NOTE — ED Notes (Signed)
FSBS now 53. MD notified.

## 2020-12-12 NOTE — ED Notes (Signed)
Pt transported to MRI 

## 2020-12-12 NOTE — ED Provider Notes (Signed)
[facility] Emergency Department Provider Note ____________________________________________   Event Date/Time   First MD Initiated Contact with Patient 12/12/20 1605     (approximate)  I have reviewed the triage vital signs and the nursing notes.  HISTORY  Chief Complaint Hypoglycemia   HPI [name] is a 65 y.o. femalewho presents to the ED for evaluation of hypoglycemia and altered mentation.  Chart review indicates history of DM on insulin, chronic pain syndrome on chronic opiates.  Patient presents from home via EMS for evaluation of altered mentation and hypoglycemia.  EMS reports they frequently go to the patient's home, call for hypoglycemia, they provide D10, she improves and refuses transport.  They report any similar call today, she was more confused and was unable to refuse transport so they bring her here for evaluation.  EMS reports improving mentation as they arrive here and room the patient is they are providing D10.  Here in the ED, patient reports acute on chronic global pain.  She reports significant pain throughout her lumbar and thoracic back, legs and, chest.  She reports this is typical for her chronic pain and she is uncertain if she took her morning medications today.  She reports that she does not know what happened and has no memory from this morning or any events occurring earlier today.   Past Medical History:  Diagnosis Date  . Acute encephalopathy 05/22/2018  . Allergy   . Anginal pain ([place])   . Anxiety   . Ascites   . C. difficile colitis 07/10/2015  . Cancer (HCC)    HX OF CANCER OF UTERUS   . Cirrhosis of liver not due to alcohol ([place]) 2016  . Degenerative disk disease   . Diverticulitis   . Gastroparesis   . GERD (gastroesophageal reflux disease)   . History of hiatal hernia   . Hypertension   . Hypothyroid   . Hypothyroidism due to amiodarone   . Ileus ([place]) 08/01/2015  . Intussusception intestine ([place])  05/2015  . Orthostatic hypotension   . PAF (paroxysmal atrial fibrillation) ([place]) 03/2015   a. new onset 03/2015 in setting of intractable N/V; b. on Eliquis 5 mg bid; c. CHADSVASc 4 (DM, TIA x 2, female)  . Pancreatitis   . Pneumonia 11/14/2015  . Pneumonia 07/2020  . Right ureteral stone 07/14/2016  . Sepsis ([place]) 12/15/2017  . Sick sinus syndrome ([place])   . Stomach ulcer   . Stroke [facility])    with minimal left sided weakness  . Syncope 01/2015  . Syncope due to orthostatic hypotension 05/18/2015  . Tachyarrhythmia 01/10/2016  . TIA (transient ischemic attack) 02/2015  . Type 1 diabetes ([place])    on levemir  . UTERINE CANCER, HX OF 03/27/2007   Qualifier: Diagnosis of  By: Maxie Better FNP, Rosalita Levan   . UTI (urinary tract infection) 05/22/2018    Patient Active Problem List   Diagnosis Date Noted  . History of substance use disorder 12/12/2020  . Substance use disorder 12/12/2020  . Hematuria 10/27/2020  . Hemoptysis 10/27/2020  . Weakness 09/28/2020  . COVID-19 virus infection   . Stroke ([place]) 09/27/2020  . Atrial fibrillation with RVR ([place]) 08/27/2020  . Hypoglycemia 08/27/2020  . CKD (chronic kidney disease), stage III ([place]) 08/27/2020  . Shortness of breath 07/16/2020  . [place] intertrigo 07/16/2020  . Sepsis ([place]) 11/23/2019  . Acute pyelonephritis 09/30/2019  . Acute pain of left foot 08/18/2019  . Type II diabetes mellitus with renal  manifestations ([place]) 08/13/2019  . Weakness of both lower extremities 07/31/2019  . Altered mental status 07/14/2019  . GAD (generalized anxiety disorder) 06/11/2019  . Poor memory 06/11/2019  . Constipation 05/25/2019  . Acute medial meniscus tear of left knee 05/25/2019  . AKI (acute kidney injury) ([place]) 05/16/2019  . Closed fracture of left distal fibula 05/16/2019  . Sinus tachycardia 05/05/2019  . Congestive dilated cardiomyopathy ([place]) 04/14/2019  . CAD (coronary artery disease) 04/10/2019  . Closed fracture of lateral malleolus  04/10/2019  . Lumbar facet syndrome (Bilateral) (R>L) 03/09/2019  . Long term (current) use of anticoagulants 03/09/2019  . Unspecified cirrhosis of liver ([place]) 02/11/2019  . Hypoalbuminemia 02/11/2019  . Edema due to hypoalbuminemia 02/11/2019  . Lower extremity edema 01/26/2019  . Prolonged QT interval 12/17/2018  . Hypomagnesemia 12/17/2018  . Uncontrolled type 2 diabetes mellitus ([place]) 12/17/2018  . H/O TIA (transient ischemic attack) and stroke 12/17/2018  . Personal history of surgery to heart and great vessels, presenting hazards to health 12/17/2018  . DDD (degenerative disc disease), lumbar 12/17/2018  . Lumbar intervertebral disc displacement 12/17/2018  . Chronic musculoskeletal pain 12/17/2018  . Chronic generalized pain 12/17/2018  . Hyponatremia 12/17/2018  . Fibromyalgia syndrome 12/17/2018  . Other intervertebral disc displacement, lumbar region 12/17/2018  . Vitamin D deficiency 11/26/2018  . Elevated C-reactive protein (CRP) 11/26/2018  . Elevated sed rate 11/26/2018  . Chronic low back pain (Primary area of Pain) (Bilateral) (L>R) w/ sciatica (Bilateral) 11/25/2018  . Chronic lower extremity pain (Secondary Area of Pain) (Bilateral) (L>R) 11/25/2018  . Chronic neck pain 11/25/2018  . Pharmacologic therapy 11/25/2018  . Disorder of skeletal system 11/25/2018  . Problems influencing health status 11/25/2018  . Renal mass 10/06/2018  . Chronic intermittent abdominal pain 09/25/2018  . Unspecified abdominal pain 09/25/2018  . Frequent falls 09/12/2018  . Orthostatic hypotension 08/18/2018  . Normochromic normocytic anemia 08/18/2018  . Acute renal failure superimposed on stage 3a chronic kidney disease ([place]) 08/18/2018  . History of hypothyroidism 08/14/2018  . Medical non-compliance 08/14/2018  . Diabetic ketoacidosis ([place]) 05/22/2018  . Urinary tract infection, acute 05/22/2018  . Diarrhea   . NSTEMI (non-ST elevated myocardial infarction) ([place]) 01/11/2016  .  Tachy-brady syndrome ([place]) 12/28/2015  . PAF (paroxysmal atrial fibrillation) ([place]) 11/24/2015  . Type 2 diabetes mellitus with neurologic complication ([place]) 24/40/1027  . S/P cholecystectomy 11/23/2015  . Narcotic abuse ([place]) 11/22/2015  . Chronic superficial gastritis without bleeding 11/22/2015  . History of Clostridium difficile 11/22/2015  . History of TIA (transient ischemic attack) 11/22/2015  . Mixed hyperlipidemia 11/22/2015  . Diverticulosis 11/22/2015  . History of uterine cancer 11/22/2015  . Hypothyroidism, unspecified 11/22/2015  . Intractable cyclical vomiting with nausea 11/22/2015  . Community acquired pneumonia of left lower lobe of lung 11/14/2015  . Narcotic withdrawal ([place]) 11/11/2015  . Hyperlipidemia 08/31/2015  . Hypotension 08/06/2015  . Cryptogenic cirrhosis ([place]) 07/10/2015  . Atrial fibrillation ([place]) 07/10/2015  . Malnutrition of moderate degree 07/04/2015  . Symptomatic bradycardia 05/27/2015  . Chronic anemia 05/19/2015  . Thrombocytopenia ([place]) 05/19/2015  . Hypokalemia 04/06/2015  . Hyperlipidemia with target LDL less than 100 04/06/2015  . Syncope 03/11/2015  . CVA (cerebral vascular accident) ([place]) 02/15/2015  . Essential hypertension 01/12/2015  . Chronically on opiate therapy 01/12/2015  . Cirrhosis of liver not due to alcohol ([place]) 2016  . S/P Nissen fundoplication (without gastrostomy tube) procedure 05/11/2014  . Dysphagia 04/19/2014  . Myofascial pain syndrome 06/27/2007  . Chronic  pain syndrome 03/28/2007  . GERD 03/27/2007  . Diverticulosis of large intestine without perforation or abscess without bleeding 03/27/2007  . Proteinuria 03/27/2007    Past Surgical History:  Procedure Laterality Date  . ABDOMINAL HYSTERECTOMY    . CARDIAC CATHETERIZATION N/A 01/12/2016   Procedure: Left Heart Cath and Coronary Angiography;  Surgeon: Wellington Hampshire, MD;  Location: [facility];  Service: Cardiovascular;  Laterality: N/A;  .  CHOLECYSTECTOMY    . CYSTOSCOPY W/ RETROGRADES Bilateral 12/11/2019   Procedure: CYSTOSCOPY WITH RETROGRADE PYELOGRAM;  Surgeon: Billey Co, MD;  Location: [facility] ORS;  Service: Urology;  Laterality: Bilateral;  . CYSTOSCOPY WITH BIOPSY N/A 12/11/2019   Procedure: CYSTOSCOPY WITH BLADDER BIOPSY;  Surgeon: Billey Co, MD;  Location: [facility] ORS;  Service: Urology;  Laterality: N/A;  . CYSTOSCOPY WITH FULGERATION N/A 12/11/2019   Procedure: CYSTOSCOPY WITH FULGERATION;  Surgeon: Billey Co, MD;  Location: [facility] ORS;  Service: Urology;  Laterality: N/A;  . CYSTOSCOPY WITH STENT PLACEMENT Right 12/11/2019   Procedure: CYSTOSCOPY WITH STENT PLACEMENT;  Surgeon: Billey Co, MD;  Location: [facility] OR[facility];  Service: Urology;  Laterality: Right;  . CYSTOSCOPY WITH URETHRAL DILATATION Right 12/11/2019   Procedure: CYSTOSCOPY WITH URETERAL DILATATION;  Surgeon: Billey Co, MD;  Location: [facility] ORS;  Service: Urology;  Laterality: Right;  . CYSTOSCOPY/URETEROSCOPY/HOLMIUM LASER Right 07/14/2016   Procedure: CYSTOSCOPY/URETEROSCOPY/HOLMIUM LASER;  Surgeon: Alexis Frock, MD;  Location: [facility] OR[facility];  Service: Urology;  Laterality: Right;  . ESOPHAGOGASTRODUODENOSCOPY N/A 04/04/2015   Procedure: ESOPHAGOGASTRODUODENOSCOPY (EGD);  Surgeon: Hulen Luster, MD;  Location: [facility] ENDOSCOPY;  Service: Endoscopy;  Laterality: N/A;  . ESOPHAGOGASTRODUODENOSCOPY N/A 12/28/2017   Procedure: ESOPHAGOGASTRODUODENOSCOPY (EGD);  Surgeon: Lin Landsman, MD;  Location: [facility] ENDOSCOPY;  Service: Gastroenterology;  Laterality: N/A;  . ESOPHAGOGASTRODUODENOSCOPY (EGD) WITH PROPOFOL N/A 01/18/2016   Procedure: ESOPHAGOGASTRODUODENOSCOPY (EGD) WITH PROPOFOL;  Surgeon: Lucilla Lame, MD;  Location: [facility] ENDOSCOPY;  Service: Endoscopy;  Laterality: N/A;  . FLEXIBLE SIGMOIDOSCOPY N/A 01/18/2016   Procedure: FLEXIBLE SIGMOIDOSCOPY;  Surgeon: Lucilla Lame, MD;  Location: [facility] ENDOSCOPY;  Service: Endoscopy;  Laterality: N/A;  . HERNIA  REPAIR    . URETEROSCOPY Right 12/11/2019   Procedure: Christen Butter;  Surgeon: Billey Co, MD;  Location: [facility] ORS;  Service: Urology;  Laterality: Right;    Prior to Admission medications   Medication Sig Start Date End Date Taking? Authorizing Provider  albuterol (VENTOLIN HFA) 108 (90 Base) MCG/ACT inhaler Inhale 2 puffs into the lungs every 4 (four) hours as needed for wheezing or shortness of breath. 07/18/20   Elodia Florence., MD  atorvastatin (LIPITOR) 40 MG tablet Take 40 mg by mouth at bedtime.    [provider]  cyclobenzaprine (FLEXERIL) 10 MG tablet TAKE 1 TABLET BY MOUTH 3 TIMES DAILY AS NEEDED FOR MUSCLE SPASMS. MUST LASTS 30 DAYS. 12/12/20   Lesleigh Noe, MD  glucose blood (ACCU-CHEK AVIVA PLUS) test strip Use as instructed to test blood sugar up to 4 times daily 10/09/19   [name], NP  insulin detemir (LEVEMIR FLEXTOUCH) 100 UNIT/ML FlexPen Inject 32 Units into the skin 2 (two) times daily. 10/27/20   [name], NP  lidocaine (LIDODERM) 5 % Place 1 patch onto the skin every 12 (twelve) hours. Remove & Discard patch within 12 hours or as directed by MD 11/09/20 11/09/21  Blake Divine, MD  Melatonin 10 MG CAPS Take 1 tablet by mouth at bedtime.    [provider]  metoprolol tartrate (LOPRESSOR) 25 MG tablet Take 0.5 tablets (12.5 mg total) by mouth 2 (two) times daily. This is a decrease from your previous 25 mg twice daily. 09/29/20   Enzo Bi, MD  midodrine (PROAMATINE) 5 MG tablet Take 1 tablet (5 mg total) by mouth 3 (three) times daily with meals. To help prevent blood pressure dropping when you stand. 09/29/20 12/28/20  Enzo Bi, MD  nitroGLYCERIN (NITROSTAT) 0.3 MG SL tablet Place 1 tablet (0.3 mg total) under the tongue every 5 (five) minutes as needed for chest pain. 07/24/19 07/23/20  Nena Polio, MD  oxyCODONE (OXY IR/ROXICODONE) 5 MG immediate release tablet Take 1 tablet (5 mg total) by mouth 3 (three) times  daily for 7 days, THEN 1 tablet (5 mg total) 2 (two) times daily for 7 days, THEN 1 tablet (5 mg total) daily for 7 days. 11/10/20 12/01/20  Milinda Pointer, MD  pantoprazole (PROTONIX) 40 MG tablet Take 1 tablet (40 mg total) by mouth daily. 07/18/20 08/17/20  Elodia Florence., MD  sertraline (ZOLOFT) 50 MG tablet Take 1 tablet (50 mg total) by mouth daily. For anxiety and depression. 10/27/20   [name], NP  Spacer/Aero-Hold Chamber Bags MISC Use spacer with inhaler when you have shortness of breath 07/18/20   Elodia Florence., MD  TECHLITE PEN NEEDLES 32G X 6 MM MISC USE TWICE DAILY AS DIRECTED 09/12/20   [name], NP    Allergies Aspirin, Codeine sulfate, Erythromycin, Prednisone, Rosiglitazone maleate, and Tetanus-diphtheria toxoids td  Family History  Problem Relation Age of Onset  . Hypertension Mother   . CAD Sister   . Heart attack Sister        Deceased November 21, 2014  . CAD Brother     Social History Social History   Tobacco Use  . Smoking status: Former Smoker    Types: Cigarettes  . Smokeless tobacco: Never Used  . Tobacco comment: 25 years ago and only smoked occasionally  Vaping Use  . Vaping Use: Never used  Substance Use Topics  . Alcohol use: No  . Drug use: No    Review of Systems  Difficult to accurately assess due to patient's amnesia of recent events  Constitutional: No fever/chills Eyes: No visual changes. ENT: No sore throat. Cardiovascular: Denies chest pain. Respiratory: Denies shortness of breath. Gastrointestinal: No abdominal pain.  No nausea, no vomiting.  No diarrhea.  No constipation. Genitourinary: Negative for dysuria. Musculoskeletal: Positive for acute on chronic pain throughout her thoracic and lumbar back, chest and legs. Skin: Negative for rash. Neurological: Negative for headaches, focal weakness or numbness.    ____________________________________________   PHYSICAL EXAM:  VITAL SIGNS: Vitals:    12/12/20 1800 12/12/20 1830  BP: (!) 175/88 (!) 175/81  Pulse: 99 93  Resp: 20 14  Temp:    SpO2: 100% 100%     Constitutional: Alert and oriented. Well appearing and in no acute distress.  Moving all 4 extremities without apparent deficit.  Follows commands in all 4 extremities and asks multiple times what happened today. Eyes: Conjunctivae are normal. PERRL. EOMI. Head: Atraumatic. No erythema, bulging or obvious external swelling to left-sided mastoid area posterior to her left auricle.  She is tender to palpation on the left, where she is not on the right.  Bilateral TMs are visualized without erythema or purulence. Nose: No congestion/rhinnorhea. Mouth/Throat: Mucous membranes are moist.  Oropharynx non-erythematous. Neck: No stridor. No cervical spine tenderness to palpation. Cardiovascular:  Normal rate, regular rhythm. Grossly normal heart sounds.  Good peripheral circulation. Respiratory: Normal respiratory effort.  No retractions. Lungs CTAB. Gastrointestinal: Soft , nondistended, nontender to palpation. No CVA tenderness. Musculoskeletal: No lower extremity tenderness nor edema.  No joint effusions. No signs of acute trauma. Poorly localizing bilateral lumbar pain without overlying skin changes or signs of trauma.  No midline step-offs or signs of trauma to the back. Neurologic: No gross focal neurologic deficits are appreciated.  Cranial nerves II through XII intact 5/5 strength and sensation in all 4 extremities She is disoriented and confused.  She has difficulty remembering the name of her husband, but eventually tells me the name "[name]" which is correct.  She has amnesia of events, but a nonfocal exam without laterality Skin:  Skin is warm, dry and intact. No rash noted. Psychiatric: Mood and affect are normal. Speech and behavior are normal.  ____________________________________________   LABS (all labs ordered are listed, but only abnormal results are  displayed)  Labs Reviewed  COMPREHENSIVE METABOLIC PANEL - Abnormal; Notable for the following components:      Result Value   Potassium 3.3 (*)    Chloride 113 (*)    CO2 19 (*)    Glucose, Bld 234 (*)    Creatinine, Ser 1.02 (*)    Calcium 7.5 (*)    Total Protein 6.3 (*)    Albumin 3.2 (*)    All other components within normal limits  CBC WITH DIFFERENTIAL/PLATELET - Abnormal; Notable for the following components:   Hemoglobin 10.8 (*)    HCT 35.1 (*)    RDW 18.9 (*)    Platelets 128 (*)    Lymphs Abs 0.5 (*)    All other components within normal limits  CBG MONITORING, ED - Abnormal; Notable for the following components:   Glucose-Capillary 115 (*)    All other components within normal limits  CBG MONITORING, ED - Abnormal; Notable for the following components:   Glucose-Capillary 53 (*)    All other components within normal limits  SARS CORONAVIRUS 2 (TAT 6-24 HRS)  URINALYSIS, COMPLETE (UACMP) WITH MICROSCOPIC  URINE DRUG SCREEN, QUALITATIVE ([facility] ONLY)  TROPONIN I (HIGH SENSITIVITY)  TROPONIN I (HIGH SENSITIVITY)   ____________________________________________  12 Lead EKG  Sinus rhythm, rate of 96 bpm.  Normal axis.  Prolonged QTC at 543 ms.  No evidence of acute ischemia. ____________________________________________  RADIOLOGY  ED MD interpretation: CT head reviewed by me without evidence of acute intracranial pathology.  Official radiology report(s): CT Head Wo Contrast  Result Date: 12/12/2020 CLINICAL DATA:  65 year old female with history of syncope and confusion on Eliquis. Evaluate for intracranial hemorrhage. EXAM: CT HEAD WITHOUT CONTRAST TECHNIQUE: Contiguous axial images were obtained from the base of the skull through the vertex without intravenous contrast. COMPARISON:  Head CT 11/09/2020. FINDINGS: Brain: Mild cerebral atrophy. Patchy and confluent areas of decreased attenuation are noted throughout the deep and periventricular white matter of the  cerebral hemispheres bilaterally, compatible with chronic microvascular ischemic disease. No evidence of acute infarction, hemorrhage, hydrocephalus, extra-axial collection or mass lesion/mass effect. Vascular: No hyperdense vessel or unexpected calcification. Skull: Normal. Negative for fracture or focal lesion. Sinuses/Orbits: Paranasal sinuses are clear. Right mastoid is well pneumatized. Large left mastoid effusion. Other: None. IMPRESSION: 1. No acute intracranial abnormality. Specifically, no evidence of acute intracranial hemorrhage. 2. Large left mastoid effusion. Clinical correlation for signs and symptoms of mastoiditis is recommended. 3. Mild cerebral atrophy with chronic microvascular ischemic changes in the  cerebral white matter, as above. Electronically Signed   By: Vinnie Langton M.D.   On: 12/12/2020 16:45    ____________________________________________   PROCEDURES and INTERVENTIONS  Procedure(s) performed (including Critical Care):  .1-3 Lead EKG Interpretation Performed by: Vladimir Crofts, MD Authorized by: Vladimir Crofts, MD     Interpretation: normal     ECG rate:  92   ECG rate assessment: normal     Rhythm: sinus rhythm     Ectopy: none     Conduction: normal      Medications  magnesium sulfate IVPB 2 g 50 mL (2 g Intravenous New Bag/Given 12/12/20 1850)  acetaminophen (TYLENOL) tablet 1,000 mg (1,000 mg Oral Given 12/12/20 1630)  oxyCODONE (Oxy IR/ROXICODONE) immediate release tablet 5 mg (5 mg Oral Given 12/12/20 1630)  dextrose 50 % solution 50 mL (50 mLs Intravenous Given 12/12/20 1643)  methocarbamol (ROBAXIN) tablet 500 mg (500 mg Oral Given 12/12/20 1824)  lactated ringers bolus 1,000 mL (1,000 mLs Intravenous New Bag/Given 12/12/20 1801)  Ampicillin-Sulbactam (UNASYN) 3 g in sodium chloride 0.9 % 100 mL IVPB (0 g Intravenous Stopped 12/12/20 1850)    ____________________________________________   MDM / ED COURSE   65 year old woman presents to the ED with nonfocal  confusion, disorientation of uncertain etiology requiring medical admission.  Hemodynamically stable without fever or hypoxia.  Exam is difficult due to her disorientation, confusion and amnesia of the events of today.  Her exam is nonfocal without evidence of neurologic deficits or vascular deficits.  No significant dysarthria, aphasia or dysmetria is noted.  She does have tenderness to her left mastoid without significant overlying skin changes or signs of trauma.  CT head without evidence of acute ICH, CVA, and does demonstrate some radiographic signs of mastoiditis.  Due to the uncertainty of her presentation, these imaging results, and her associated tenderness, patient was provided IV Unasyn for treatment of mastoiditis.  Patient without evidence of sepsis.  Patient's blood work with nonspecific decrease in her bicarbonate, for which she received IV fluids.  While she initially presented with concerns for hypoglycemia, I am concerned that the fingersticks have been unreliable due to poor perfusion to her fingertips.  Blood sample drawn from her AC at the time of our hypoglycemic fingerstick in the 50s, demonstrated glucose in the 200s.  Patient continues to be confused, disoriented with a nonfocal examination throughout her stay in the ED without significant changes to her mental status.  She is repeatedly requesting medications for her chronic pain syndrome, and provide her with doses commiserate with her home medications.  MRI brain ordered by me and pending the time of admission.  I am uncertain of the etiology of her altered mentation, due to her continued symptoms, we will admit patient for further work-up and management.  Clinical Course as of 12/12/20 1918  Mon Dec 12, 2020  1639 Fingerstick in the 60s. D50 ordered [DS]  1703 Reassessed.  Patient just finished D50.  Continues to be confused. [DS]  64 Spoke with husband, [name], over the phone. He reports he wasn't able to get her to wake up. She  was acting normally last night and this morning.  We discussed how she is still disoriented after fixing her hypoglycemia.  We discussed medical mission.  He is agreeable.  He has nothing else to add. [DS]  8891 [id number].  Patient is awake and continue to be confused and disoriented. She does report tenderness to palpation of her left mastoid upon my palpation. [DS]  16 I discuss the case with Dr. Marcello Moores, who agrees to admit. She requests CXR and UDS added to the workup, which I do, and she agrees to follow up on these studies [DS]    Clinical Course User Index [DS] Vladimir Crofts, MD    ____________________________________________   FINAL CLINICAL IMPRESSION(S) / ED DIAGNOSES  Final diagnoses:  Prolonged Q-T interval on ECG  Confusion  Hypoglycemia  Mastoiditis of left side     ED Discharge Orders    None       Chandler Stofer Tamala Julian   Note:  This document was prepared using Dragon voice recognition software and may include unintentional dictation errors.   Vladimir Crofts, MD 12/12/20 [phone]

## 2020-12-13 DIAGNOSIS — R410 Disorientation, unspecified: Secondary | ICD-10-CM

## 2020-12-13 DIAGNOSIS — E162 Hypoglycemia, unspecified: Secondary | ICD-10-CM | POA: Diagnosis not present

## 2020-12-13 DIAGNOSIS — R4182 Altered mental status, unspecified: Secondary | ICD-10-CM

## 2020-12-13 DIAGNOSIS — G9340 Encephalopathy, unspecified: Secondary | ICD-10-CM | POA: Diagnosis not present

## 2020-12-13 DIAGNOSIS — I4891 Unspecified atrial fibrillation: Secondary | ICD-10-CM

## 2020-12-13 LAB — CBG MONITORING, ED
Glucose-Capillary: 101 mg/dL — ABNORMAL HIGH (ref 70–99)
Glucose-Capillary: 148 mg/dL — ABNORMAL HIGH (ref 70–99)
Glucose-Capillary: 158 mg/dL — ABNORMAL HIGH (ref 70–99)
Glucose-Capillary: 159 mg/dL — ABNORMAL HIGH (ref 70–99)
Glucose-Capillary: 159 mg/dL — ABNORMAL HIGH (ref 70–99)
Glucose-Capillary: 186 mg/dL — ABNORMAL HIGH (ref 70–99)
Glucose-Capillary: 228 mg/dL — ABNORMAL HIGH (ref 70–99)

## 2020-12-13 LAB — GLUCOSE, CAPILLARY
Glucose-Capillary: 209 mg/dL — ABNORMAL HIGH (ref 70–99)
Glucose-Capillary: 192 mg/dL — ABNORMAL HIGH (ref 70–99)

## 2020-12-13 LAB — COMPREHENSIVE METABOLIC PANEL
ALT: 18 U/L (ref 0–44)
AST: 29 U/L (ref 15–41)
Albumin: 3 g/dL — ABNORMAL LOW (ref 3.5–5.0)
Alkaline Phosphatase: 122 U/L (ref 38–126)
Anion gap: 7 (ref 5–15)
BUN: 19 mg/dL (ref 8–23)
CO2: 18 mmol/L — ABNORMAL LOW (ref 22–32)
Calcium: 8.2 mg/dL — ABNORMAL LOW (ref 8.9–10.3)
Chloride: 116 mmol/L — ABNORMAL HIGH (ref 98–111)
Creatinine, Ser: 1.05 mg/dL — ABNORMAL HIGH (ref 0.44–1.00)
GFR, Estimated: 59 mL/min — ABNORMAL LOW (ref >60)
Glucose, Bld: 177 mg/dL — ABNORMAL HIGH (ref 70–99)
Potassium: 4.4 mmol/L (ref 3.5–5.1)
Sodium: 141 mmol/L (ref 135–145)
Total Bilirubin: 0.9 mg/dL (ref 0.3–1.2)
Total Protein: 6 g/dL — ABNORMAL LOW (ref 6.5–8.1)

## 2020-12-13 LAB — CBC
HCT: 31 % — ABNORMAL LOW (ref 36.0–46.0)
Hemoglobin: 9.7 g/dL — ABNORMAL LOW (ref 12.0–15.0)
MCH: 26.8 pg (ref 26.0–34.0)
MCHC: 31.3 g/dL (ref 30.0–36.0)
MCV: 85.6 fL (ref 80.0–100.0)
Platelets: 110 10*3/uL — ABNORMAL LOW (ref 150–400)
RBC: 3.62 MIL/uL — ABNORMAL LOW (ref 3.87–5.11)
RDW: 19.1 % — ABNORMAL HIGH (ref 11.5–15.5)
WBC: 5 10*3/uL (ref 4.0–10.5)
nRBC: 0 % (ref 0.0–0.2)

## 2020-12-13 LAB — CORTISOL: Cortisol, Plasma: 13 ug/dL

## 2020-12-13 LAB — SARS CORONAVIRUS 2 (TAT 6-24 HRS): SARS Coronavirus 2: NEGATIVE

## 2020-12-13 MED ORDER — HYDRALAZINE HCL 20 MG/ML IJ SOLN
5.0000 mg | INTRAMUSCULAR | Status: AC | PRN
  Administered 2020-12-13 (×2): 5 mg via INTRAVENOUS
  Filled 2020-12-13 (×2): qty 1

## 2020-12-13 MED ORDER — METOPROLOL TARTRATE 25 MG PO TABS
12.5000 mg | ORAL_TABLET | Freq: Two times a day (BID) | ORAL | Status: DC
  Administered 2020-12-14 – 2020-12-16 (×5): 12.5 mg via ORAL
  Filled 2020-12-13 (×6): qty 1

## 2020-12-13 MED ORDER — LIDOCAINE 5 % EX PTCH
1.0000 | MEDICATED_PATCH | CUTANEOUS | Status: DC
  Administered 2020-12-13 – 2020-12-17 (×5): 1 via TRANSDERMAL
  Filled 2020-12-13 (×5): qty 1

## 2020-12-13 NOTE — ED Notes (Signed)
Pt easily aroused with verbal stimulation from resting state-  now asking "where am I" -- unable to recall being in the hospital; pt also unable to state her name of date.  Dr Damita Dunnings, on call hospitalist, notified via secure chat.  Provider also made aware of current blood glucose; orders to decrease D5NS from 131m/hr to 566mhr.

## 2020-12-13 NOTE — ED Notes (Signed)
Pt awake and alert- repositioned at this time and meal tray, provided at patient request, has set up for patient.  Moderate BUE tremors noted with mildly slurred speech.  RR even and unlabored on 2L O2 via [place]. Continuous pulse ox and cardiac monitoring maintained.  D5NS continues to infuse @125ml /hr.  Current CBG 101.  Will monitor for acute changes and maintain plan of care.

## 2020-12-13 NOTE — ED Notes (Signed)
1st attempt at giving PO tylenol unsuccessful d/t coughing. 2nd order of PO tylenol pulled per speech therapist, unsuccessful at giving. NPO for now

## 2020-12-13 NOTE — ED Notes (Signed)
Pt coughing after sip of water. Dr Caryl Pina for IV prn pain meds, pt requesting for pain.  Requested SLP and to hold diet for now. Awaiting response.

## 2020-12-13 NOTE — ED Notes (Signed)
Verbal order Dr Kurtis Bushman SLP order and NPO until evaluated

## 2020-12-13 NOTE — Consult Note (Addendum)
NEURO HOSPITALIST CONSULT NOTE   Requestig physician: Dr. Kurtis Bushman  Reason for Consult: AMS  History obtained from:  Chart     HPI:                                                                                                                                          [name] is an 65 y.o. female with a PMHx of DM type I, uterine cancer, liver cirrhosis, HTN, hypothyroidism, stroke with minimal residual left sided weakness, TIA, PAF, SSS, orthostatic hypotension, chronic pain syndrome, chronic opiate use with documented non-compliance and pain-medication seeking behavior with her Pain Management clinic, who presented to the ED yesterday with AMS and hypoglycemia. EMS were called to her home due to her being unarousable from sleep. They noted hypoglycemia and administered D10. EMS stated to the EDP that they frequently go to the patient's home after being called for hypoglycemia, for which they administer D10, following which she improves and refuses transport. They reported a similar call today, but she was more confused and was unable to refuse transport, so they brought her to the ED for evaluation.  EMS reported improving mentation on arrival to the ED. On evaluation by the EDP, the patient reported acute on chronic lumbar pain, thoracic back pain, leg pain and chest pain. She stated that this was her typical distribution of pain. She was unable to specify if she had taken her medications due to having lost her memory of events occurring during the day prior to EMS being called.   In the ED, the patient became more awake and alert. BUE tremors were noted with mildly slurred speech. she desatted while resting with eyes closed to 70's on room air, with normalization after being placed on [place]. .   The patient was admitted by the Hospitalist service. DDx for her AMS per primary team includes toxic/metabolic, tricyclic antidepressant toxicity, pain/sleep medication toxicity and  refractory hypoglycemia.  CT head: 1. No acute intracranial abnormality. Specifically, no evidence of acute intracranial hemorrhage. 2. Large left mastoid effusion. Clinical correlation for signs and symptoms of mastoiditis is recommended. 3. Mild cerebral atrophy with chronic microvascular ischemic changes in the cerebral white matter, as above.  MRI brain:  1. No acute intracranial abnormality. 2. Age-related cerebral atrophy with mild-to-moderate chronic microvascular ischemic disease, with a few scattered small remote ischemic infarcts as above, stable. 3. Chronic left mastoid and middle ear effusion, stable from Previous.  Labs: TSH: Normal Glucose 177 U/A: Hazy, negative nitriite, no bacteria seen, small number of leukocytes Plasma cortisol: 13 CBC with normal white count Ammonia: Normal AST and ALT: Normal  BUN 19 Cr 1.05 Na 141 Ca unremarkable in context of low albumin  ED urine toxicology screen:   Past Medical History:  Diagnosis Date  . Acute encephalopathy 05/22/2018  . Allergy   . Anginal pain ([place])   . Anxiety   . Ascites   . C. difficile colitis 07/10/2015  . Cancer (HCC)    HX OF CANCER OF UTERUS   . Cirrhosis of liver not due to alcohol ([place]) 2016  . Degenerative disk disease   . Diverticulitis   . Gastroparesis   . GERD (gastroesophageal reflux disease)   . History of hiatal hernia   . Hypertension   . Hypothyroid   . Hypothyroidism due to amiodarone   . Ileus ([place]) 08/01/2015  . Intussusception intestine ([place]) 05/2015  . Orthostatic hypotension   . PAF (paroxysmal atrial fibrillation) ([place]) 03/2015   a. new onset 03/2015 in setting of intractable N/V; b. on Eliquis 5 mg bid; c. CHADSVASc 4 (DM, TIA x 2, female)  . Pancreatitis   . Pneumonia 11/14/2015  . Pneumonia 07/2020  . Right ureteral stone 07/14/2016  . Sepsis ([place]) 12/15/2017  . Sick sinus syndrome ([place])   . Stomach ulcer   . Stroke [facility])    with minimal left sided weakness  .  Syncope 01/2015  . Syncope due to orthostatic hypotension 05/18/2015  . Tachyarrhythmia 01/10/2016  . TIA (transient ischemic attack) 02/2015  . Type 1 diabetes ([place])    on levemir  . UTERINE CANCER, HX OF 03/27/2007   Qualifier: Diagnosis of  By: Maxie Better FNP, Rosalita Levan   . UTI (urinary tract infection) 05/22/2018    Past Surgical History:  Procedure Laterality Date  . ABDOMINAL HYSTERECTOMY    . CARDIAC CATHETERIZATION N/A 01/12/2016   Procedure: Left Heart Cath and Coronary Angiography;  Surgeon: Wellington Hampshire, MD;  Location: [place] [facility];  Service: Cardiovascular;  Laterality: N/A;  . CHOLECYSTECTOMY    . CYSTOSCOPY W/ RETROGRADES Bilateral 12/11/2019   Procedure: CYSTOSCOPY WITH RETROGRADE PYELOGRAM;  Surgeon: Billey Co, MD;  Location: [facility] ORS;  Service: Urology;  Laterality: Bilateral;  . CYSTOSCOPY WITH BIOPSY N/A 12/11/2019   Procedure: CYSTOSCOPY WITH BLADDER BIOPSY;  Surgeon: Billey Co, MD;  Location: [facility];  Service: Urology;  Laterality: N/A;  . CYSTOSCOPY WITH FULGERATION N/A 12/11/2019   Procedure: CYSTOSCOPY WITH FULGERATION;  Surgeon: Billey Co, MD;  Location: [facility];  Service: Urology;  Laterality: N/A;  . CYSTOSCOPY WITH STENT PLACEMENT Right 12/11/2019   Procedure: CYSTOSCOPY WITH STENT PLACEMENT;  Surgeon: Billey Co, MD;  Location: [facility];  Service: Urology;  Laterality: Right;  . CYSTOSCOPY WITH URETHRAL DILATATION Right 12/11/2019   Procedure: CYSTOSCOPY WITH URETERAL DILATATION;  Surgeon: Billey Co, MD;  Location: [facility] OR[facility];  Service: Urology;  Laterality: Right;  . CYSTOSCOPY/URETEROSCOPY/HOLMIUM LASER Right 07/14/2016   Procedure: CYSTOSCOPY/URETEROSCOPY/HOLMIUM LASER;  Surgeon: Alexis Frock, MD;  Location: [facility] OR[facility];  Service: Urology;  Laterality: Right;  . ESOPHAGOGASTRODUODENOSCOPY N/A 04/04/2015   Procedure: ESOPHAGOGASTRODUODENOSCOPY (EGD);  Surgeon: Hulen Luster, MD;  Location[facility];  Service: Endoscopy;   Laterality: N/A;  . ESOPHAGOGASTRODUODENOSCOPY N/A 12/28/2017   Procedure: ESOPHAGOGASTRODUODENOSCOPY (EGD);  Surgeon: Lin Landsman, MD;  Location: [facility] ENDOSCOPY;  Service: Gastroenterology;  Laterality: N/A;  . ESOPHAGOGASTRODUODENOSCOPY (EGD) WITH PROPOFOL N/A 01/18/2016   Procedure: ESOPHAGOGASTRODUODENOSCOPY (EGD) WITH PROPOFOL;  Surgeon: Lucilla Lame, MD;  Location: [facility] ENDOSCOPY;  Service: Endoscopy;  Laterality: N/A;  . FLEXIBLE SIGMOIDOSCOPY N/A 01/18/2016   Procedure: FLEXIBLE SIGMOIDOSCOPY;  Surgeon: Lucilla Lame, MD;  Location: [facility] ENDOSCOPY;  Service: Endoscopy;  Laterality: N/A;  . HERNIA REPAIR    .  URETEROSCOPY Right 12/11/2019   Procedure: Christen Butter;  Surgeon: Billey Co, MD;  Location: [facility] ORS;  Service: Urology;  Laterality: Right;    Family History  Problem Relation Age of Onset  . Hypertension Mother   . CAD Sister   . Heart attack Sister        Deceased 12/02/2014  . CAD Brother               Social History:  reports that she has quit smoking. Her smoking use included cigarettes. She has never used smokeless tobacco. She reports that she does not drink alcohol and does not use drugs.  Allergies  Allergen Reactions  . Aspirin Rash  . Codeine Sulfate Rash  . Erythromycin Rash    Reaction:  Fever   . Prednisone Swelling  . Rosiglitazone Maleate Swelling  . Tetanus-Diphtheria Toxoids Td Rash and Other (See Comments)    Reaction:  Fever     HOME MEDICATIONS:                                                                                                                      No current facility-administered medications on file prior to encounter.   Current Outpatient Medications on File Prior to Encounter  Medication Sig Dispense Refill  . albuterol (VENTOLIN HFA) 108 (90 Base) MCG/ACT inhaler Inhale 2 puffs into the lungs every 4 (four) hours as needed for wheezing or shortness of breath. 8 g 1  . atorvastatin (LIPITOR) 40 MG tablet Take 40  mg by mouth at bedtime.    . cyclobenzaprine (FLEXERIL) 10 MG tablet TAKE 1 TABLET BY MOUTH 3 TIMES DAILY AS NEEDED FOR MUSCLE SPASMS. MUST LASTS 30 DAYS. 90 tablet 0  . glucose blood (ACCU-CHEK AVIVA PLUS) test strip Use as instructed to test blood sugar up to 4 times daily 300 each 2  . insulin detemir (LEVEMIR FLEXTOUCH) 100 UNIT/ML FlexPen Inject 32 Units into the skin 2 (two) times daily. 30 mL 1  . lidocaine (LIDODERM) 5 % Place 1 patch onto the skin every 12 (twelve) hours. Remove & Discard patch within 12 hours or as directed by MD 10 patch 0  . Melatonin 10 MG CAPS Take 1 tablet by mouth at bedtime.    . metoprolol tartrate (LOPRESSOR) 25 MG tablet Take 0.5 tablets (12.5 mg total) by mouth 2 (two) times daily. This is a decrease from your previous 25 mg twice daily.    . midodrine (PROAMATINE) 5 MG tablet Take 1 tablet (5 mg total) by mouth 3 (three) times daily with meals. To help prevent blood pressure dropping when you stand. 90 tablet 2  . nitroGLYCERIN (NITROSTAT) 0.3 MG SL tablet Place 1 tablet (0.3 mg total) under the tongue every 5 (five) minutes as needed for chest pain. 100 tablet 0  . oxyCODONE (OXY IR/ROXICODONE) 5 MG immediate release tablet Take 1 tablet (5 mg total) by mouth 3 (three) times daily for 7 days, THEN 1 tablet (5 mg total) 2 (  two) times daily for 7 days, THEN 1 tablet (5 mg total) daily for 7 days. 42 tablet 0  . pantoprazole (PROTONIX) 40 MG tablet Take 1 tablet (40 mg total) by mouth daily. 30 tablet 0  . sertraline (ZOLOFT) 50 MG tablet Take 1 tablet (50 mg total) by mouth daily. For anxiety and depression. 90 tablet 3  . Spacer/Aero-Hold Chamber Bags MISC Use spacer with inhaler when you have shortness of breath 1 each 1  . TECHLITE PEN NEEDLES 32G X 6 MM MISC USE TWICE DAILY AS DIRECTED 100 each 0     ROS:                                                                                                                                       Complains of back  pain, but is unable to specify the exact location. Other ROS as per HPI. The patient is unable to provide a detailed ROS due to memory impairment.   Blood pressure (!) 144/81, pulse 100, temperature 98.1 F (36.7 C), temperature source Oral, resp. rate 17, height 5' 3"  (1.6 m), weight 75.8 kg, SpO2 100 %.   General Examination:                                                                                                       Physical Exam  HEENT-  [place]/AT  Lungs- Respirations unlabored Extremities- No edema  Neurological Examination Mental Status: Awake with mild to moderately decreased level of alertness. Poor attention. Speech is fluent with no errors of grammar or syntax. Can name a thumb and a pen, but not a badge or a pinky finger. Follows all motor commands. Not oriented to city, state, year, day or month. Also not oriented to her location in the hospital, but did ask for pain medications. The overall pattern of her answers is suggestive of embellished incorrect responses, rather than true disorientation.  Cranial Nerves: II: Equivocal left temporal visual field cut. Poorly cooperative with exam. PERRL.   III,IV, VI: EOMI. No nystagmus.  V,VII: Smile symmetric, facial temp sensation decreased on the left VIII: Hearing intact to voice IX,X: No hypophonia XI: Symmetric shoulder shrug XII: Midline tongue extension Motor: Poor effort with giveway weakness noted x 4 RUE 5/5 RLE: Pain on elevation. Knee extension with giveway, max strength 4+/5 LUE 4/5 with giveway and inconsistent effort LLE Severe back pain with elevation. Knee extension with giveway, max strength 4-/5 Sensory: Subjectively absent LLE sensation to FT  and cold temp. Subjectively decreased LUE sensation to cold temp.  Deep Tendon Reflexes:  2+ right biceps and brachioradialis 1+ left biceps and brachioradialis 2+ and symmetric bilateral patellae Cerebellar: No ataxia with FNF bilaterally. Intention tremor is noted  on the right.  Gait: Deferred   Lab Results: Basic Metabolic Panel: Recent Labs  Lab 12/12/20 1616 12/12/20 2031 12/13/20 0704  NA 138  --  141  K 3.3*  --  4.4  CL 113*  --  116*  CO2 19*  --  18*  GLUCOSE 234*  --  177*  BUN 20  --  19  CREATININE 1.02* 1.07* 1.05*  CALCIUM 7.5*  --  8.2*    CBC: Recent Labs  Lab 12/12/20 1616 12/12/20 2031 12/13/20 0447  WBC 7.2 6.9 5.0  NEUTROABS 6.3  --   --   HGB 10.8* 9.5* 9.7*  HCT 35.1* 30.3* 31.0*  MCV 85.8 85.4 85.6  PLT 128* 133* 110*    Cardiac Enzymes: No results for input(s): CKTOTAL, CKMB, CKMBINDEX, TROPONINI in the last 168 hours.  Lipid Panel: No results for input(s): CHOL, TRIG, HDL, CHOLHDL, VLDL, LDLCALC in the last 168 hours.  Imaging: CT Head Wo Contrast  Result Date: 12/12/2020 CLINICAL DATA:  65 year old female with history of syncope and confusion on Eliquis. Evaluate for intracranial hemorrhage. EXAM: CT HEAD WITHOUT CONTRAST TECHNIQUE: Contiguous axial images were obtained from the base of the skull through the vertex without intravenous contrast. COMPARISON:  Head CT 11/09/2020. FINDINGS: Brain: Mild cerebral atrophy. Patchy and confluent areas of decreased attenuation are noted throughout the deep and periventricular white matter of the cerebral hemispheres bilaterally, compatible with chronic microvascular ischemic disease. No evidence of acute infarction, hemorrhage, hydrocephalus, extra-axial collection or mass lesion/mass effect. Vascular: No hyperdense vessel or unexpected calcification. Skull: Normal. Negative for fracture or focal lesion. Sinuses/Orbits: Paranasal sinuses are clear. Right mastoid is well pneumatized. Large left mastoid effusion. Other: None. IMPRESSION: 1. No acute intracranial abnormality. Specifically, no evidence of acute intracranial hemorrhage. 2. Large left mastoid effusion. Clinical correlation for signs and symptoms of mastoiditis is recommended. 3. Mild cerebral atrophy with  chronic microvascular ischemic changes in the cerebral white matter, as above. Electronically Signed   By: Vinnie Langton M.D.   On: 12/12/2020 16:45   MR BRAIN WO CONTRAST  Result Date: 12/12/2020 CLINICAL DATA:  Initial evaluation for memory loss, mental status change. EXAM: MRI HEAD WITHOUT CONTRAST TECHNIQUE: Multiplanar, multiecho pulse sequences of the brain and surrounding structures were obtained without intravenous contrast. COMPARISON:  Prior CT from earlier the same day as well as previous MRI from 09/27/2020. FINDINGS: Brain: Mild age-related cerebral volume loss, stable. Patchy T2/FLAIR hyperintensity within the periventricular and deep white matter both cerebral hemispheres as well as the pons, most consistent with chronic small vessel ischemic disease, also stable. Superimposed probable small remote lacunar infarct at the central pons again noted. Small remote right cerebellar and left occipital cortical infarcts again noted as well, also stable. No abnormal foci of restricted diffusion to suggest acute or subacute ischemia. Gray-white matter differentiation otherwise maintained. No other areas of encephalomalacia to suggest chronic cortical infarction. No acute or chronic intracranial hemorrhage. No mass lesion, mass effect, or midline shift. No hydrocephalus or extra-axial fluid collection. Pituitary gland and suprasellar region normal. Vascular: Mildly increased FLAIR hyperintensity within the left transverse sinus without T1 correlate, likely reflecting slow flow. Major intracranial vascular flow voids are otherwise maintained. Skull and upper cervical spine: Craniocervical junction within normal  limits. Bone marrow signal intensity normal. No scalp soft tissue abnormality. Sinuses/Orbits: Globes and orbital soft tissues within normal limits. Mild scattered mucosal thickening noted within the ethmoidal air cells. Paranasal sinuses are otherwise clear. Chronic left mastoid and middle ear  effusion, stable from previous. Visualized nasopharynx within normal limits. Other: None. IMPRESSION: 1. No acute intracranial abnormality. 2. Age-related cerebral atrophy with mild-to-moderate chronic microvascular ischemic disease, with a few scattered small remote ischemic infarcts as above, stable. 3. Chronic left mastoid and middle ear effusion, stable from previous. Electronically Signed   By: Jeannine Boga M.D.   On: 12/12/2020 19:49   DG Chest Portable 1 View  Result Date: 12/12/2020 CLINICAL DATA:  Hyperglycemia.  Altered mental status. EXAM: PORTABLE CHEST 1 VIEW COMPARISON:  11/01/2020 FINDINGS: Shallow inspiration. Heart size and pulmonary vascularity are probably normal for inspiratory effort. No airspace disease or consolidation in the lungs. Vascular crowding. No pleural effusions. No pneumothorax. Mediastinal contours appear intact. IMPRESSION: Shallow inspiration.  No evidence of active pulmonary disease. Electronically Signed   By: Lucienne Capers M.D.   On: 12/12/2020 19:59    Assessment: 65 year old female presenting with AMS.  1. Exam reveals an awake patient with mild to moderately decreased level of alertness. Poor attention. Speech is fluent with no errors of grammar or syntax. Can name a thumb and a pen, but not a badge or a pinky finger. Follows all motor commands. Not oriented to city, state, year, day or month. Also not oriented to her location in the hospital, but did ask for pain medications. The overall pattern of her answers is suggestive of embellished incorrect responses, rather than true disorientation. Overall exam findings are consistent with improvement relative to her initial presentation based on review of notes.  2. Agree with primary team that the DDx for her AMS includes tricyclic antidepressant toxicity, pain/sleep medication toxicity and refractory hypoglycemia. Although her toxicology screen was negative for opiates, some classes of opiate medication may  not be detected by the assay.  3. MRI brain reveals no acute abnormality. Mild age-related cerebral volume loss is stable. Patchy T2/FLAIR hyperintensity within the periventricular and deep white matter both cerebral hemispheres as well as the pons, most consistent with chronic small vessel ischemic disease, is also stable. Superimposed probable small remote lacunar infarct at the central pons again noted. Small remote right cerebellar and left occipital cortical infarcts again noted as well, also stable. . 4. TSH is normal. CBC with normal white count. Ammonia, ALT and AST are normal.  5. EEG reveals continuous generalized slowing. This study is suggestive of a mild diffuse encephalopathy, nonspecific to etiology. No seizures or epileptiform discharges were seen throughout the recording. 6. Overall presentation is most consistent with medication toxicity versus soporific effects of illicit substance use, +/- neurotoxic effects of possible severe hypoglycemic episode at home.   Recommendations: 1. Continue to monitor for improvement.  2. IVF 3. PT/OT/Speech 4. Glycemic control. May need to start keeping a blood glucose diary at home.    Electronically signed: Dr. Kerney Elbe 12/13/2020, 8:36 AM

## 2020-12-13 NOTE — Progress Notes (Signed)
eeg done °

## 2020-12-13 NOTE — ED Notes (Signed)
Unable to secure chat Dr Kurtis Bushman to notify regarding neuro status, Dr Kurtis Bushman paged and notified of disorientation. Will place orders as needed

## 2020-12-13 NOTE — Evaluation (Signed)
Clinical/Bedside Swallow Evaluation Patient Details  Name: [name] MRN: [id number] Date of Birth: November 07, 1955  Today's Date: 12/13/2020 Time: SLP Start Time (ACUTE ONLY): 2336 SLP Stop Time (ACUTE ONLY): 0907 SLP Time Calculation (min) (ACUTE ONLY): 23 min  Past Medical History:  Past Medical History:  Diagnosis Date  . Acute encephalopathy 05/22/2018  . Allergy   . Anginal pain ([place])   . Anxiety   . Ascites   . C. difficile colitis 07/10/2015  . Cancer (HCC)    HX OF CANCER OF UTERUS   . Cirrhosis of liver not due to alcohol ([place]) 2016  . Degenerative disk disease   . Diverticulitis   . Gastroparesis   . GERD (gastroesophageal reflux disease)   . History of hiatal hernia   . Hypertension   . Hypothyroid   . Hypothyroidism due to amiodarone   . Ileus ([place]) 08/01/2015  . Intussusception intestine ([place]) 05/2015  . Orthostatic hypotension   . PAF (paroxysmal atrial fibrillation) ([place]) 03/2015   a. new onset 03/2015 in setting of intractable N/V; b. on Eliquis 5 mg bid; c. CHADSVASc 4 (DM, TIA x 2, female)  . Pancreatitis   . Pneumonia 11/14/2015  . Pneumonia 07/2020  . Right ureteral stone 07/14/2016  . Sepsis ([place]) 12/15/2017  . Sick sinus syndrome ([place])   . Stomach ulcer   . Stroke [facility])    with minimal left sided weakness  . Syncope 01/2015  . Syncope due to orthostatic hypotension 05/18/2015  . Tachyarrhythmia 01/10/2016  . TIA (transient ischemic attack) 02/2015  . Type 1 diabetes ([place])    on levemir  . UTERINE CANCER, HX OF 03/27/2007   Qualifier: Diagnosis of  By: Maxie Better FNP, Rosalita Levan   . UTI (urinary tract infection) 05/22/2018   Past Surgical History:  Past Surgical History:  Procedure Laterality Date  . ABDOMINAL HYSTERECTOMY    . CARDIAC CATHETERIZATION N/A 01/12/2016   Procedure: Left Heart Cath and Coronary Angiography;  Surgeon: Wellington Hampshire, MD;  Location: [facility];  Service: Cardiovascular;  Laterality: N/A;  . CHOLECYSTECTOMY    .  CYSTOSCOPY W/ RETROGRADES Bilateral 12/11/2019   Procedure: CYSTOSCOPY WITH RETROGRADE PYELOGRAM;  Surgeon: Billey Co, MD;  Location: [facility] ORS;  Service: Urology;  Laterality: Bilateral;  . CYSTOSCOPY WITH BIOPSY N/A 12/11/2019   Procedure: CYSTOSCOPY WITH BLADDER BIOPSY;  Surgeon: Billey Co, MD;  Location: [facility] OR[facility];  Service: Urology;  Laterality: N/A;  . CYSTOSCOPY WITH FULGERATION N/A 12/11/2019   Procedure: CYSTOSCOPY WITH FULGERATION;  Surgeon: Billey Co, MD;  Location: [facility] OR[facility];  Service: Urology;  Laterality: N/A;  . CYSTOSCOPY WITH STENT PLACEMENT Right 12/11/2019   Procedure: CYSTOSCOPY WITH STENT PLACEMENT;  Surgeon: Billey Co, MD;  Location: [facility];  Service: Urology;  Laterality: Right;  . CYSTOSCOPY WITH URETHRAL DILATATION Right 12/11/2019   Procedure: CYSTOSCOPY WITH URETERAL DILATATION;  Surgeon: Billey Co, MD;  Location: [facility] OR[facility];  Service: Urology;  Laterality: Right;  . CYSTOSCOPY/URETEROSCOPY/HOLMIUM LASER Right 07/14/2016   Procedure: CYSTOSCOPY/URETEROSCOPY/HOLMIUM LASER;  Surgeon: Alexis Frock, MD;  Location: [facility] OR[facility];  Service: Urology;  Laterality: Right;  . ESOPHAGOGASTRODUODENOSCOPY N/A 04/04/2015   Procedure: ESOPHAGOGASTRODUODENOSCOPY (EGD);  Surgeon: Hulen Luster, MD;  Location: [facility] ENDOSCOPY;  Service: Endoscopy;  Laterality: N/A;  . ESOPHAGOGASTRODUODENOSCOPY N/A 12/28/2017   Procedure: ESOPHAGOGASTRODUODENOSCOPY (EGD);  Surgeon: Lin Landsman, MD;  Location: [facility] ENDOSCOPY;  Service: Gastroenterology;  Laterality: N/A;  . ESOPHAGOGASTRODUODENOSCOPY (EGD) WITH PROPOFOL N/A 01/18/2016  Procedure: ESOPHAGOGASTRODUODENOSCOPY (EGD) WITH PROPOFOL;  Surgeon: Lucilla Lame, MD;  Location: [facility] ENDOSCOPY;  Service: Endoscopy;  Laterality: N/A;  . FLEXIBLE SIGMOIDOSCOPY N/A 01/18/2016   Procedure: FLEXIBLE SIGMOIDOSCOPY;  Surgeon: Lucilla Lame, MD;  Location: [facility] ENDOSCOPY;  Service: Endoscopy;  Laterality: N/A;  . HERNIA REPAIR    . URETEROSCOPY  Right 12/11/2019   Procedure: Christen Butter;  Surgeon: Billey Co, MD;  Location: [facility] ORS;  Service: Urology;  Laterality: Right;   HPI:  [name] is a 65 y.o. female with medical history significant of hypertension, hyperlipidemia, atrial fibrillation not on anticoagulation due to frequent fall, diabetes, cirrhosis, and chronic orthostatic hypotension on midodrine, history of mild covid infection s/p recovery,who presents to ED BIBEMS due to excessive lethargy/change in mental status. On evaluation in the field patient was found to be severely hypoglycemic and was started on D10 and transported to ED. Mild age-related cerebral volume loss, stable. Patchy  T2/FLAIR hyperintensity within the periventricular and deep white  matter both cerebral hemispheres as well as the pons, most  consistent with chronic small vessel ischemic disease, also stable.  Superimposed probable small remote lacunar infarct at the central  pons again noted. Small remote right cerebellar and left occipital  cortical infarcts again noted as well, also stable.   Assessment / Plan / Recommendation Clinical Impression  Pt is at a very high risk of aspirating d/t pt's AMS. Pt had poor awareness of bolus of thin liquids that resulted in immediate coughing suspect decreased oral containment and possible delayed swallow initiation. When given a heavier more cohesive bolus of applesauce, she produced greater coughing. coughing was not present for ~ 2 sips of thin liquids however, pt's RR and HR as well as WOB increased greatly. She was not able to follow any directions to gain control over respirations. At this time, recommend continued NPO with all medications via alternate means. Should pt's cognition improve throughout today, she may have ice chips with nursing supervision. SLP Visit Diagnosis: Dysphagia, unspecified (R13.10)    Aspiration Risk  Severe aspiration risk;Risk for inadequate nutrition/hydration     Diet Recommendation NPO   Medication Administration: Via alternative means Postural Changes: Seated upright at 90 degrees;Remain upright for at least 30 minutes after po intake    Other  Recommendations Oral Care Recommendations: Oral care QID;Oral care prior to ice chip/H20 Other Recommendations: Have oral suction available   Follow up Recommendations  (TBD)      Frequency and Duration min 2x/week  2 weeks       Prognosis Prognosis for Safe Diet Advancement: Fair Barriers to Reach Goals: Cognitive deficits;Severity of deficits;Behavior      Swallow Study   General Date of Onset: 12/12/20 HPI: [name] is a 65 y.o. female with medical history significant of hypertension, hyperlipidemia, atrial fibrillation not on anticoagulation due to frequent fall, diabetes, cirrhosis, and chronic orthostatic hypotension on midodrine, history of mild covid infection s/p recovery,who presents to ED BIBEMS due to excessive lethargy/change in mental status. On evaluation in the field patient was found to be severely hypoglycemic and was started on D10 and transported to ED. Mild age-related cerebral volume loss, stable. Patchy  T2/FLAIR hyperintensity within the periventricular and deep white  matter both cerebral hemispheres as well as the pons, most  consistent with chronic small vessel ischemic disease, also stable.  Superimposed probable small remote lacunar infarct at the central  pons again noted. Small remote right cerebellar and left occipital  cortical infarcts again noted  as well, also stable. Type of Study: Bedside Swallow Evaluation Previous Swallow Assessment: BSE - 09/28/2020 - recommended dysphagia 1 with thin liquids Diet Prior to this Study: NPO Temperature Spikes Noted: No Respiratory Status: Room air History of Recent Intubation: No Behavior/Cognition: Confused;Doesn't follow directions;Requires cueing Oral Cavity Assessment: Within Functional Limits Oral Care Completed by  SLP: Recent completion by staff Oral Cavity - Dentition: Adequate natural dentition Self-Feeding Abilities: Total assist Patient Positioning: Upright in bed Baseline Vocal Quality: Not observed Volitional Cough: Cognitively unable to elicit Volitional Swallow: Unable to elicit    Oral/Motor/Sensory Function Overall Oral Motor/Sensory Function: Generalized oral weakness (limited assessment d/t AMS)   Ice Chips Ice chips: Not tested   Thin Liquid Thin Liquid: Impaired Presentation: Straw Oral Phase Impairments: Poor awareness of bolus Pharyngeal  Phase Impairments: Suspected delayed Swallow (intermittent coughing)    Nectar Thick Nectar Thick Liquid: Not tested   Honey Thick Honey Thick Liquid: Not tested   Puree Puree: Impaired Presentation: Spoon Oral Phase Impairments: Poor awareness of bolus;Reduced lingual movement/coordination;Impaired mastication Oral Phase Functional Implications: Prolonged oral transit;Oral holding Pharyngeal Phase Impairments: Suspected delayed Swallow;Cough - Immediate (gagging)   Solid     Solid: Not tested     Rita Brainerd B. Rutherford [name] M.S., CCC-SLP, [place] [facility] Office [phone]  [name] 12/13/2020,1:00 PM

## 2020-12-13 NOTE — ED Notes (Signed)
Seizure pads in place

## 2020-12-13 NOTE — ED Notes (Signed)
Pt family has been updated.

## 2020-12-13 NOTE — ED Notes (Signed)
Pt requesting something for pain, per Dr Kurtis Bushman, do not give pain meds since patient is currently confused.

## 2020-12-13 NOTE — ED Notes (Signed)
Pt O2 desat to 70s on RA while resting with eyes closed; pt subsequently placed on 3L O2 via [place] with improvement to 100%.

## 2020-12-13 NOTE — ED Notes (Signed)
EEG tech at bedside. 

## 2020-12-13 NOTE — Procedures (Signed)
Patient Name: [name]  MRN: [id number]  Epilepsy Attending: Lora Havens  Referring Physician/Provider: Dr Myles Rosenthal Date: 12/13/2020 Duration: 23.24 mins  Patient history: 66yo F with ams. EEG to evaluate for seizure  Level of alertness: Awake, asleep  AEDs during EEG study: None  Technical aspects: This EEG study was done with scalp electrodes positioned according to the 10-20 International system of electrode placement. Electrical activity was acquired at a sampling rate of 500Hz  and reviewed with a high frequency filter of 70Hz  and a low frequency filter of 1Hz . EEG data were recorded continuously and digitally stored.   Description: The posterior dominant rhythm consists of 7-8 Hz activity of moderate voltage (25-35 uV) seen predominantly in posterior head regions, symmetric and reactive to eye opening and eye closing. Sleep was characterized by vertex waves, sleep spindles (12 to 14 Hz), maximal frontocentral region. EEG also showed continuous generalized 5-6 Hz theta slowing. Physiologic photic driving was not seen during photic stimulation.  Hyperventilation was not performed.     ABNORMALITY - Continuous slow, generalized  IMPRESSION: This study is suggestive of mild diffuse encephalopathy, nonspecific etiology. No seizures or epileptiform discharges were seen throughout the recording.  Rodriques Badie Barbra Sarks

## 2020-12-13 NOTE — Progress Notes (Signed)
PROGRESS NOTE    [name]  XVQ:[id number] DOB: [birth year] DOA: 12/12/2020 PCP: [name], NP    Brief Narrative:  [name] is a 65 y.o. female with medical history significant of  history of hypertension, hyperlipidemia, atrial fibrillation not on anticoagulation due to frequent fall, diabetes, cirrhosis, and chronic orthostatic hypotension on midodrine, history of mild covid infection s/p recovery,who presents to ED BIBEMS due to excessive lethargy/change in mental status. ON evaluation in the field patient was found to be severely hypoglycemic and was started on D10 and transported to ED. Patient states she has no recollection of what occurred prior to presenting to ED. She does note chronic back pain, but notes no f/c/n/v/d/or abdominal pain or cough or sob.  IN ed on initial evaluation neuro exam non-focal other than for noted encephalopathy fs ranged from 50;s-200s after treatment. Currently on questioning does not know her name and is not oriented. Not able to give further history   4/5-BG stablized. Still confused some.   Consultants:     Procedures:   Antimicrobials:     unasyn x1  Subjective: No cp or sob. C/o all over pain  Objective: Vitals:   12/13/20 1000 12/13/20 1030 12/13/20 1100 12/13/20 1500  BP: 110/67 127/72 (!) 121/59 (!) 132/92  Pulse: (!) 107 (!) 103 (!) 102 96  Resp: 17 15 17 18   Temp:    98.2 F (36.8 C)  TempSrc:    Oral  SpO2: 100% 100% 100% 100%  Weight:      Height:       No intake or output data in the 24 hours ending 12/13/20 1553 Filed Weights   12/12/20 1608  Weight: 75.8 kg    Examination:  General exam: Appears calm and comfortable, sleepy  Respiratory system: Clear to auscultation. Respiratory effort normal. Cardiovascular system: S1 & S2 heard, RRR. No JVD, murmurs, rubs, gallops or clicks.  Gastrointestinal system: Abdomen is nondistended, soft and nontender.  Normal bowel sounds heard. Central nervous  system:awakens, drawsy. confused Extremities: no edema Skin: warm dry Psychiatry:  Mood & affect appropriate in current setting    Data Reviewed: I have personally reviewed following labs and imaging studies  CBC: Recent Labs  Lab 12/12/20 1616 12/12/20 2031 12/13/20 0447  WBC 7.2 6.9 5.0  NEUTROABS 6.3  --   --   HGB 10.8* 9.5* 9.7*  HCT 35.1* 30.3* 31.0*  MCV 85.8 85.4 85.6  PLT 128* 133* 093*   Basic Metabolic Panel: Recent Labs  Lab 12/12/20 1616 12/12/20 2031 12/13/20 0704  NA 138  --  141  K 3.3*  --  4.4  CL 113*  --  116*  CO2 19*  --  18*  GLUCOSE 234*  --  177*  BUN 20  --  19  CREATININE 1.02* 1.07* 1.05*  CALCIUM 7.5*  --  8.2*   GFR: Estimated Creatinine Clearance: 51.4 mL/min (A) (by C-G formula based on SCr of 1.05 mg/dL (H)). Liver Function Tests: Recent Labs  Lab 12/12/20 1616 12/13/20 0704  AST 33 29  ALT 21 18  ALKPHOS 111 122  BILITOT 0.8 0.9  PROT 6.3* 6.0*  ALBUMIN 3.2* 3.0*   No results for input(s): LIPASE, AMYLASE in the last 168 hours. Recent Labs  Lab 12/12/20 2138  AMMONIA 29   Coagulation Profile: No results for input(s): INR, PROTIME in the last 168 hours. Cardiac Enzymes: No results for input(s): CKTOTAL, CKMB, CKMBINDEX, TROPONINI in the last 168 hours. BNP (  last 3 results) No results for input(s): PROBNP in the last 8760 hours. HbA1C: No results for input(s): HGBA1C in the last 72 hours. CBG: Recent Labs  Lab 12/13/20 0345 12/13/20 0605 12/13/20 0724 12/13/20 0958 12/13/20 1204  GLUCAP 159* 228* 159* 148* 186*   Lipid Profile: No results for input(s): CHOL, HDL, LDLCALC, TRIG, CHOLHDL, LDLDIRECT in the last 72 hours. Thyroid Function Tests: Recent Labs    12/12/20 2031  TSH 2.886   Anemia Panel: No results for input(s): VITAMINB12, FOLATE, FERRITIN, TIBC, IRON, RETICCTPCT in the last 72 hours. Sepsis Labs: No results for input(s): PROCALCITON, LATICACIDVEN in the last 168 hours.  Recent Results  (from the past 240 hour(s))  SARS CORONAVIRUS 2 (TAT 6-24 HRS) Nasopharyngeal     Status: None   Collection Time: 12/12/20  5:45 PM   Specimen: Nasopharyngeal  Result Value Ref Range Status   SARS Coronavirus 2 NEGATIVE NEGATIVE Final    Comment: (NOTE) SARS-CoV-2 target nucleic acids are NOT DETECTED.  The SARS-CoV-2 RNA is generally detectable in upper and lower respiratory specimens during the acute phase of infection. Negative results do not preclude SARS-CoV-2 infection, do not rule out co-infections with other pathogens, and should not be used as the sole basis for treatment or other patient management decisions. Negative results must be combined with clinical observations, patient history, and epidemiological information. The expected result is Negative.  Fact Sheet for Patients: SugarRoll.be  Fact Sheet for Healthcare Providers: https://www.woods-mathews.com/  This test is not yet approved or cleared by the [place] FDA and  has been authorized for detection and/or diagnosis of SARS-CoV-2 by FDA under an Emergency Use Authorization (EUA). This EUA will remain  in effect (meaning this test can be used) for the duration of the COVID-19 declaration under Se ction 564(b)(1) of the Act, 21 U.S.C. section 360bbb-3(b)(1), unless the authorization is terminated or revoked sooner.  Performed at [facility], [place] [address], [place], [place] [address]          Radiology Studies: CT Head Wo Contrast  Result Date: 12/12/2020 CLINICAL DATA:  65 year old female with history of syncope and confusion on Eliquis. Evaluate for intracranial hemorrhage. EXAM: CT HEAD WITHOUT CONTRAST TECHNIQUE: Contiguous axial images were obtained from the base of the skull through the vertex without intravenous contrast. COMPARISON:  Head CT 11/09/2020. FINDINGS: Brain: Mild cerebral atrophy. Patchy and confluent areas of decreased attenuation are  noted throughout the deep and periventricular white matter of the cerebral hemispheres bilaterally, compatible with chronic microvascular ischemic disease. No evidence of acute infarction, hemorrhage, hydrocephalus, extra-axial collection or mass lesion/mass effect. Vascular: No hyperdense vessel or unexpected calcification. Skull: Normal. Negative for fracture or focal lesion. Sinuses/Orbits: Paranasal sinuses are clear. Right mastoid is well pneumatized. Large left mastoid effusion. Other: None. IMPRESSION: 1. No acute intracranial abnormality. Specifically, no evidence of acute intracranial hemorrhage. 2. Large left mastoid effusion. Clinical correlation for signs and symptoms of mastoiditis is recommended. 3. Mild cerebral atrophy with chronic microvascular ischemic changes in the cerebral white matter, as above. Electronically Signed   By: Vinnie Langton M.D.   On: 12/12/2020 16:45   MR BRAIN WO CONTRAST  Result Date: 12/12/2020 CLINICAL DATA:  Initial evaluation for memory loss, mental status change. EXAM: MRI HEAD WITHOUT CONTRAST TECHNIQUE: Multiplanar, multiecho pulse sequences of the brain and surrounding structures were obtained without intravenous contrast. COMPARISON:  Prior CT from earlier the same day as well as previous MRI from 09/27/2020. FINDINGS: Brain: Mild age-related cerebral  volume loss, stable. Patchy T2/FLAIR hyperintensity within the periventricular and deep white matter both cerebral hemispheres as well as the pons, most consistent with chronic small vessel ischemic disease, also stable. Superimposed probable small remote lacunar infarct at the central pons again noted. Small remote right cerebellar and left occipital cortical infarcts again noted as well, also stable. No abnormal foci of restricted diffusion to suggest acute or subacute ischemia. Gray-white matter differentiation otherwise maintained. No other areas of encephalomalacia to suggest chronic cortical infarction. No  acute or chronic intracranial hemorrhage. No mass lesion, mass effect, or midline shift. No hydrocephalus or extra-axial fluid collection. Pituitary gland and suprasellar region normal. Vascular: Mildly increased FLAIR hyperintensity within the left transverse sinus without T1 correlate, likely reflecting slow flow. Major intracranial vascular flow voids are otherwise maintained. Skull and upper cervical spine: Craniocervical junction within normal limits. Bone marrow signal intensity normal. No scalp soft tissue abnormality. Sinuses/Orbits: Globes and orbital soft tissues within normal limits. Mild scattered mucosal thickening noted within the ethmoidal air cells. Paranasal sinuses are otherwise clear. Chronic left mastoid and middle ear effusion, stable from previous. Visualized nasopharynx within normal limits. Other: None. IMPRESSION: 1. No acute intracranial abnormality. 2. Age-related cerebral atrophy with mild-to-moderate chronic microvascular ischemic disease, with a few scattered small remote ischemic infarcts as above, stable. 3. Chronic left mastoid and middle ear effusion, stable from previous. Electronically Signed   By: Jeannine Boga M.D.   On: 12/12/2020 19:49   DG Chest Portable 1 View  Result Date: 12/12/2020 CLINICAL DATA:  Hyperglycemia.  Altered mental status. EXAM: PORTABLE CHEST 1 VIEW COMPARISON:  11/01/2020 FINDINGS: Shallow inspiration. Heart size and pulmonary vascularity are probably normal for inspiratory effort. No airspace disease or consolidation in the lungs. Vascular crowding. No pleural effusions. No pneumothorax. Mediastinal contours appear intact. IMPRESSION: Shallow inspiration.  No evidence of active pulmonary disease. Electronically Signed   By: Lucienne Capers M.D.   On: 12/12/2020 19:59        Scheduled Meds: . heparin  5,000 Units Subcutaneous Q8H  . lidocaine  1 patch Transdermal Q24H  . sodium chloride flush  3 mL Intravenous Q12H   Continuous  Infusions: . dextrose 5 % and 0.9% NaCl 50 mL/hr at 12/13/20 0612    Assessment & Plan:   Active Problems:   Hypoglycemia   Acute encephalopathy   Acute Encephalopathy   -ct head/MRI negative for CVA   -most likely toxic metabolic/ ?TCA/pain medication/sleep medications (polypharmacy) as well as refractory hypoglycemia  4/5- hold pain meds, sedatives Start lidocaine for her pain of back /shoulder/all over Neurology consulted Continue neurochecks eeg completed   DMII with Hypoglycemia -BG doing better Will change FS to q4hrs Continue d5ns Speech path evaluated-see note  atrial fibrillation In SR Not on a/c due to frequent falls Continue bb if able to  Hypertention Resume beta blk  Prolonged QT -s/p mag in ed -repeat ekg now  -monitor on tele    Orthostatic hypotension  -prn midodrine   hyperlipidemia - continue statin    Cirrhosis -no active issues -ammonia pending   chronic pain  Start lidocaine Avoid pain meds for now   DVT prophylaxis: Heparin Code Status: Full Family Communication: UPDATED HUSBAND  Status is: Inpatient  Remains inpatient appropriate because:Inpatient level of care appropriate due to severity of illness   Dispo: The patient is from: Home              Anticipated d/c is to: TBD  Patient currently is not medically stable to d/c.   Difficult to place patient No            LOS: 1 day   Time spent: 35 minutes with more than 50% on [place], MD [facility] Pager [phone]  If 7PM-7AM, please contact night-coverage 12/13/2020, 3:53 PM

## 2020-12-13 NOTE — ED Notes (Signed)
Pt disoriented, when asked name, pt states " I dont know, whats your name" MD notified of pts confusion by previous RN  Stretcher locked in lowest position, call bell in reach

## 2020-12-14 ENCOUNTER — Inpatient Hospital Stay: Payer: Medicare HMO

## 2020-12-14 ENCOUNTER — Encounter: Payer: Self-pay | Admitting: Internal Medicine

## 2020-12-14 ENCOUNTER — Other Ambulatory Visit: Payer: Self-pay

## 2020-12-14 DIAGNOSIS — E162 Hypoglycemia, unspecified: Secondary | ICD-10-CM | POA: Diagnosis not present

## 2020-12-14 LAB — GLUCOSE, CAPILLARY
Glucose-Capillary: 121 mg/dL — ABNORMAL HIGH (ref 70–99)
Glucose-Capillary: 124 mg/dL — ABNORMAL HIGH (ref 70–99)
Glucose-Capillary: 154 mg/dL — ABNORMAL HIGH (ref 70–99)
Glucose-Capillary: 178 mg/dL — ABNORMAL HIGH (ref 70–99)
Glucose-Capillary: 222 mg/dL — ABNORMAL HIGH (ref 70–99)
Glucose-Capillary: 235 mg/dL — ABNORMAL HIGH (ref 70–99)
Glucose-Capillary: 255 mg/dL — ABNORMAL HIGH (ref 70–99)
Glucose-Capillary: 264 mg/dL — ABNORMAL HIGH (ref 70–99)

## 2020-12-14 LAB — HEMOGLOBIN A1C
Hgb A1c MFr Bld: 9.3 % — ABNORMAL HIGH (ref 4.8–5.6)
Mean Plasma Glucose: 220.21 mg/dL

## 2020-12-14 LAB — INSULIN AND C-PEPTIDE, SERUM
C-Peptide: 0.3 ng/mL — ABNORMAL LOW (ref 1.1–4.4)
Insulin: <0.4 u[IU]/mL — ABNORMAL LOW (ref 2.6–24.9)

## 2020-12-14 MED ORDER — INSULIN ASPART 100 UNIT/ML ~~LOC~~ SOLN
0.0000 [IU] | Freq: Three times a day (TID) | SUBCUTANEOUS | Status: DC
  Administered 2020-12-14: 5 [IU] via SUBCUTANEOUS
  Administered 2020-12-14 – 2020-12-16 (×4): 2 [IU] via SUBCUTANEOUS
  Administered 2020-12-16: 3 [IU] via SUBCUTANEOUS
  Administered 2020-12-16: 09:00:00 2 [IU] via SUBCUTANEOUS
  Filled 2020-12-14 (×7): qty 1

## 2020-12-14 MED ORDER — INSULIN ASPART 100 UNIT/ML ~~LOC~~ SOLN: 0.0000 [IU] | Freq: Every day | SUBCUTANEOUS | Status: DC

## 2020-12-14 MED ORDER — HYDRALAZINE HCL 20 MG/ML IJ SOLN
5.0000 mg | INTRAMUSCULAR | Status: DC | PRN
  Administered 2020-12-14: 5 mg via INTRAVENOUS
  Filled 2020-12-14: qty 1

## 2020-12-14 MED ORDER — SERTRALINE HCL 50 MG PO TABS
50.0000 mg | ORAL_TABLET | Freq: Every day | ORAL | Status: DC
  Administered 2020-12-14: 11:00:00 50 mg via ORAL
  Filled 2020-12-14: qty 1

## 2020-12-14 MED ORDER — MIDODRINE HCL 5 MG PO TABS
5.0000 mg | ORAL_TABLET | Freq: Three times a day (TID) | ORAL | Status: DC
  Administered 2020-12-14 – 2020-12-17 (×8): 5 mg via ORAL
  Filled 2020-12-14 (×9): qty 1

## 2020-12-14 MED ORDER — INSULIN GLARGINE 100 UNIT/ML ~~LOC~~ SOLN
7.0000 [IU] | Freq: Every day | SUBCUTANEOUS | Status: DC
  Administered 2020-12-14 – 2020-12-17 (×4): 7 [IU] via SUBCUTANEOUS
  Filled 2020-12-14 (×4): qty 0.07

## 2020-12-14 NOTE — TOC Initial Note (Signed)
Transition of Care [facility]) - Initial/Assessment Note    Patient Details  Name: [name] MRN: [id number] Date of Birth: 1956/02/14  Transition of Care [facility]) CM/SW Contact:    Shelbie Hutching, RN Phone Number: 12/14/2020, 3:19 PM  Clinical Narrative:                 Patient admitted to the hospital with confusion, lethargy, and hypoglycemia, history of a fib and cirrhosis.  RNCM met with patient at the bedside.  Patient is from home and lives with her husband, [name].  Patient reports that she does well at home and is pretty independent.  Neither she or her husband drive, they do not have a car, they rely on friends for transportation.  Patient is current with her PCP and [facility] pharmacy delivers prescriptions to her.   PT is recommending SNF and patient agrees.  Patient has no preference in facility.  RNCM starting bed search.    Expected Discharge Plan: [place] Barriers to Discharge: Continued Medical Work up   Patient Goals and CMS Choice Patient states their goals for this hospitalization and ongoing recovery are:: would like to go to rehab CMS Medicare.gov Compare Post Acute Care list provided to:: Patient Choice offered to / list presented to : Patient  Expected Discharge Plan and Services Expected Discharge Plan: [place]   Discharge Planning Services: CM Consult Post Acute Care Choice: [place] Living arrangements for the past 2 months: Mobile Home                 DME Arranged: N/A DME Agency: NA       HH Arranged: NA          Prior Living Arrangements/Services Living arrangements for the past 2 months: Mobile Home Lives with:: Spouse Patient language and need for interpreter reviewed:: Yes Do you feel safe going back to the place where you live?: Yes      Need for Family Participation in Patient Care: Yes (Comment) (hypoglycemia) Care giver support system in place?: Yes (comment) (husband) Current  home services: DME (cane, walker, shower stool, wheelchair) Criminal Activity/Legal Involvement Pertinent to Current Situation/Hospitalization: No - Comment as needed  Activities of Daily Living      Permission Sought/Granted Permission sought to share information with : Case Manager,Family Chief Financial Officer Permission granted to share information with : Yes, Verbal Permission Granted  Share Information with NAME: [name]  Permission granted to share info w AGENCY: SNFs  Permission granted to share info w Relationship: husband     Emotional Assessment Appearance:: Appears stated age Attitude/Demeanor/Rapport: Engaged Affect (typically observed): Accepting Orientation: : Oriented to Self,Oriented to Place,Oriented to  Time,Oriented to Situation Alcohol / Substance Use: Not Applicable Psych Involvement: No (comment)  Admission diagnosis:  Confusion [R41.0] Hypoglycemia [E16.2] Prolonged Q-T interval on ECG [R94.31] Mastoiditis of left side [H70.92] Acute encephalopathy [G93.40] Patient Active Problem List   Diagnosis Date Noted  . Acute encephalopathy 12/13/2020  . History of substance use disorder 12/12/2020  . Substance use disorder 12/12/2020  . Hematuria 10/27/2020  . Hemoptysis 10/27/2020  . Weakness 09/28/2020  . COVID-19 virus infection   . Stroke ([place]) 09/27/2020  . Hypoglycemia 08/27/2020  . CKD (chronic kidney disease), stage III ([place]) 08/27/2020  . Shortness of breath 07/16/2020  . Candidal intertrigo 07/16/2020  . Sepsis ([place]) 11/23/2019  . Acute pyelonephritis 09/30/2019  . Acute pain of left foot 08/18/2019  . Type  II diabetes mellitus with renal manifestations ([place]) 08/13/2019  . Weakness of both lower extremities 07/31/2019  . Altered mental status 07/14/2019  . GAD (generalized anxiety disorder) 06/11/2019  . Poor memory 06/11/2019  . Constipation 05/25/2019  . Acute medial meniscus tear of left knee 05/25/2019  . AKI (acute  kidney injury) ([place]) 05/16/2019  . Closed fracture of left distal fibula 05/16/2019  . Sinus tachycardia 05/05/2019  . Congestive dilated cardiomyopathy ([place]) 04/14/2019  . CAD (coronary artery disease) 04/10/2019  . Closed fracture of lateral malleolus 04/10/2019  . Lumbar facet syndrome (Bilateral) (R>L) 03/09/2019  . Long term (current) use of anticoagulants 03/09/2019  . Unspecified cirrhosis of liver ([place]) 02/11/2019  . Hypoalbuminemia 02/11/2019  . Edema due to hypoalbuminemia 02/11/2019  . Lower extremity edema 01/26/2019  . Prolonged QT interval 12/17/2018  . Hypomagnesemia 12/17/2018  . Uncontrolled type 2 diabetes mellitus ([place]) 12/17/2018  . H/O TIA (transient ischemic attack) and stroke 12/17/2018  . Personal history of surgery to heart and great vessels, presenting hazards to health 12/17/2018  . DDD (degenerative disc disease), lumbar 12/17/2018  . Lumbar intervertebral disc displacement 12/17/2018  . Chronic musculoskeletal pain 12/17/2018  . Chronic generalized pain 12/17/2018  . Hyponatremia 12/17/2018  . Fibromyalgia syndrome 12/17/2018  . Other intervertebral disc displacement, lumbar region 12/17/2018  . Vitamin D deficiency 11/26/2018  . Elevated C-reactive protein (CRP) 11/26/2018  . Elevated sed rate 11/26/2018  . Chronic low back pain (Primary area of Pain) (Bilateral) (L>R) w/ sciatica (Bilateral) 11/25/2018  . Chronic lower extremity pain (Secondary Area of Pain) (Bilateral) (L>R) 11/25/2018  . Chronic neck pain 11/25/2018  . Pharmacologic therapy 11/25/2018  . Disorder of skeletal system 11/25/2018  . Problems influencing health status 11/25/2018  . Renal mass 10/06/2018  . Chronic intermittent abdominal pain 09/25/2018  . Unspecified abdominal pain 09/25/2018  . Frequent falls 09/12/2018  . Orthostatic hypotension 08/18/2018  . Normochromic normocytic anemia 08/18/2018  . Acute renal failure superimposed on stage 3a chronic kidney disease ([place])  08/18/2018  . History of hypothyroidism 08/14/2018  . Medical non-compliance 08/14/2018  . Diabetic ketoacidosis ([place]) 05/22/2018  . Urinary tract infection, acute 05/22/2018  . Diarrhea   . NSTEMI (non-ST elevated myocardial infarction) ([place]) 01/11/2016  . Tachy-brady syndrome ([place]) 12/28/2015  . PAF (paroxysmal atrial fibrillation) ([place]) 11/24/2015  . Type 2 diabetes mellitus with neurologic complication ([place]) 64/15/8309  . S/P cholecystectomy 11/23/2015  . Narcotic abuse ([place]) 11/22/2015  . Chronic superficial gastritis without bleeding 11/22/2015  . History of Clostridium difficile 11/22/2015  . History of TIA (transient ischemic attack) 11/22/2015  . Mixed hyperlipidemia 11/22/2015  . Diverticulosis 11/22/2015  . History of uterine cancer 11/22/2015  . Hypothyroidism, unspecified 11/22/2015  . Intractable cyclical vomiting with nausea 11/22/2015  . Community acquired pneumonia of left lower lobe of lung 11/14/2015  . Narcotic withdrawal ([place]) 11/11/2015  . Hyperlipidemia 08/31/2015  . Hypotension 08/06/2015  . Cryptogenic cirrhosis ([place]) 07/10/2015  . Atrial fibrillation ([place]) 07/10/2015  . Malnutrition of moderate degree 07/04/2015  . Symptomatic bradycardia 05/27/2015  . Chronic anemia 05/19/2015  . Thrombocytopenia ([place]) 05/19/2015  . Hypokalemia 04/06/2015  . Hyperlipidemia with target LDL less than 100 04/06/2015  . Syncope 03/11/2015  . CVA (cerebral vascular accident) ([place]) 02/15/2015  . Essential hypertension 01/12/2015  . Chronically on opiate therapy 01/12/2015  . Cirrhosis of liver not due to alcohol ([place]) 2016  . S/P Nissen fundoplication (without gastrostomy tube) procedure 05/11/2014  . Dysphagia 04/19/2014  . Myofascial pain  syndrome 06/27/2007  . Chronic pain syndrome 03/28/2007  . GERD 03/27/2007  . Diverticulosis of large intestine without perforation or abscess without bleeding 03/27/2007  . Proteinuria 03/27/2007   PCP:  [name],  NP Pharmacy:   [place], [place] - [address] [place] [address] Phone: [phone] Fax: [phone]     Social Determinants of Health (SDOH) Interventions    Readmission Risk Interventions Readmission Risk Prevention Plan 10/01/2019 07/18/2019 07/16/2019  Transportation Screening Complete Complete Complete  Medication Review Press photographer) Complete Complete Complete  PCP or Specialist appointment within 3-5 days of discharge Complete Complete -  [place] or Home Care Consult Complete Complete Complete  SW Recovery Care/Counseling Consult Complete Complete -  Palliative Care Screening Not Applicable Not Applicable Not [place] - Not Applicable Patient Refused  Some recent data might be hidden

## 2020-12-14 NOTE — Progress Notes (Signed)
On assessment patient IV found infiltrated at the left ac site. Left arm was swollen, echymotic with blisters around the tegaderm. IV was removed, warm moist compress was applied, arm elevated on pillow. MD notified

## 2020-12-14 NOTE — Evaluation (Signed)
Physical Therapy Evaluation Patient Details Name: Rita CZERWONKA MRN: [id number] DOB: December 02, 1955 Today's Date: 12/14/2020   History of Present Illness  [name] is a 65 y.o. female with medical history significant of   history of hypertension, hyperlipidemia, atrial fibrillation not on anticoagulation due to frequent fall, diabetes, cirrhosis, and chronic orthostatic hypotension on midodrine, history of mild covid infection s/p recovery,who presents to ED BIBEMS due to excessive lethargy/change in mental status. ON evaluation in the field patient was found to be severely hypoglycemic and was started on D10 and transported to ED. Patient states she has no recollection of what occurred prior to presenting to ED. She does note chronic back pain, but notes no f/c/n/v/d/or abdominal pain or cough or sob.  IN ed on initial evaluation neuro exam non-focal other than for noted encephalopathy fs ranged from 50;s-200s after treatment. Currently on questioning does not know her name and is not oriented. Not able to give further history  Clinical Impression  Patient received in supine in bed. She is pleasant but confused. Patient's left arm swollen and blistered due to IV infiltration. Patient requires mod assist for bed mobility. Reports back pain with this. She is able to sit unsupported on edge of bed but reports dizziness that required her to lie back down. BP in supine was 150/71, seated 129/61. RN notified. Patient will continue to benefit from skilled PT while here to improve strength, safety and functional independence.     Follow Up Recommendations SNF    Equipment Recommendations  Other (comment) (TBD)    Recommendations for Other Services       Precautions / Restrictions Precautions Precautions: Fall Restrictions Weight Bearing Restrictions: No      Mobility  Bed Mobility Overal bed mobility: Needs Assistance Bed Mobility: Supine to Sit;Sit to Supine     Supine to sit: Mod  assist Sit to supine: Min assist   General bed mobility comments: Patient requires min/mod assist for bed mobility due to pain in back and limited use of L UE. Patient reports dizziness upon sitting up on side of bed. Patient Orhtostatic    Transfers                 General transfer comment: not attempted due to patient dizziness  Ambulation/Gait             General Gait Details: deferred  Stairs            Wheelchair Mobility    Modified Rankin (Stroke Patients Only)       Balance Overall balance assessment: Modified Independent                                           Pertinent Vitals/Pain Pain Assessment: Faces Faces Pain Scale: Hurts little more Pain Location: Back, left arm Pain Descriptors / Indicators: Discomfort Pain Intervention(s): Limited activity within patient's tolerance;Monitored during session    Home Living Family/patient expects to be discharged to:: Private residence Living Arrangements: Spouse/significant other Available Help at Discharge: Family;Available 24 hours/day Type of Home: Mobile home Home Access: Ramped entrance     Home Layout: One level Home Equipment: [place] - 2 wheels;Bedside commode;Shower seat;Wheelchair - manual Additional Comments: Per chart review, husband has a disability    Prior Function Level of Independence: Needs assistance;Independent with assistive device(s)   Gait / Transfers Assistance Needed: Pt reports using a  RW for functional mobility within the home. Pt was given a wheelchair following last hospital admission but states that she has not used it since initial arrival home. Pt endorses 30 falls within the past 6 months, with pt stating that she is often dizzy before the fall ( taken from prior admission 2 months ago)  ADL's / Homemaking Assistance Needed: Pt reports being independent with dressing. Pt states that she takes baths to wash up        Hand Dominance   Dominant  Hand: Right    Extremity/Trunk Assessment   Upper Extremity Assessment Upper Extremity Assessment: LUE deficits/detail LUE Deficits / Details: L UE swollen and blistered due to infiltration, patient reports L UE numb LUE Sensation: decreased light touch LUE Coordination: decreased gross motor    Lower Extremity Assessment Lower Extremity Assessment: Generalized weakness       Communication   Communication: No difficulties  Cognition Arousal/Alertness: Awake/alert Behavior During Therapy: WFL for tasks assessed/performed Overall Cognitive Status: No family/caregiver present to determine baseline cognitive functioning                                        General Comments      Exercises     Assessment/Plan    PT Assessment Patient needs continued PT services  PT Problem List Decreased strength;Decreased mobility;Decreased safety awareness;Decreased activity tolerance;Decreased balance;Pain;Decreased cognition       PT Treatment Interventions Therapeutic exercise;Gait training;Balance training;Functional mobility training;Therapeutic activities;Patient/family education;Cognitive remediation    PT Goals (Current goals can be found in the Care Plan section)  Acute Rehab PT Goals Patient Stated Goal: none stated PT Goal Formulation: With patient Time For Goal Achievement: 12/28/20    Frequency Min 2X/week   Barriers to discharge Decreased caregiver support      Co-evaluation               AM-PAC PT "6 Clicks" Mobility  Outcome Measure Help needed turning from your back to your side while in a flat bed without using bedrails?: A Lot Help needed moving from lying on your back to sitting on the side of a flat bed without using bedrails?: A Lot Help needed moving to and from a bed to a chair (including a wheelchair)?: A Lot Help needed standing up from a chair using your arms (e.g., wheelchair or bedside chair)?: A Lot Help needed to walk in  hospital room?: Total Help needed climbing 3-5 steps with a railing? : Total 6 Click Score: 10    End of Session   Activity Tolerance: Other (comment) (limited by dizziness) Patient left: in bed;with call bell/phone within reach;with bed alarm set Nurse Communication: Mobility status;Other (comment) (Patient orthostatic, linens changed due to patient's bed being wet.) PT Visit Diagnosis: Other abnormalities of gait and mobility (R26.89);Muscle weakness (generalized) (M62.81);Dizziness and giddiness (R42);Repeated falls (R29.6);Pain Pain - part of body:  (back)    Time: 0017-4944 PT Time Calculation (min) (ACUTE ONLY): 20 min   Charges:   PT Evaluation $PT Eval Moderate Complexity: 1 Mod          Moishy Laday, PT, GCS 12/14/20,11:12 AM

## 2020-12-14 NOTE — NC FL2 (Signed)
[place] LEVEL OF CARE SCREENING TOOL     IDENTIFICATION  Patient Name: [name] Birthdate: [birth year] Sex: female Admission Date (Current Location): 12/12/2020  [place] and [place] Number:  Engineering geologist and Address:  [facility], [address] [place], [place] [address]      Provider Number: [id number]  Attending Physician Name and Address:  Dessa Phi, DO  Relative Name and Phone Number:  Navreet Bolda [phone]    Current Level of Care: Hospital Recommended Level of Care: [place] Prior Approval Number:    Date Approved/Denied:   PASRR Number: [id number]  Discharge Plan: SNF    Current Diagnoses: Patient Active Problem List   Diagnosis Date Noted  . Acute encephalopathy 12/13/2020  . History of substance use disorder 12/12/2020  . Substance use disorder 12/12/2020  . Hematuria 10/27/2020  . Hemoptysis 10/27/2020  . Weakness 09/28/2020  . COVID-19 virus infection   . Stroke ([place]) 09/27/2020  . Hypoglycemia 08/27/2020  . CKD (chronic kidney disease), stage III ([place]) 08/27/2020  . Shortness of breath 07/16/2020  . Candidal intertrigo 07/16/2020  . Sepsis ([place]) 11/23/2019  . Acute pyelonephritis 09/30/2019  . Acute pain of left foot 08/18/2019  . Type II diabetes mellitus with renal manifestations ([place]) 08/13/2019  . Weakness of both lower extremities 07/31/2019  . Altered mental status 07/14/2019  . GAD (generalized anxiety disorder) 06/11/2019  . Poor memory 06/11/2019  . Constipation 05/25/2019  . Acute medial meniscus tear of left knee 05/25/2019  . AKI (acute kidney injury) ([place]) 05/16/2019  . Closed fracture of left distal fibula 05/16/2019  . Sinus tachycardia 05/05/2019  . Congestive dilated cardiomyopathy ([place]) 04/14/2019  . CAD (coronary artery disease) 04/10/2019  . Closed fracture of lateral malleolus 04/10/2019  . Lumbar facet syndrome (Bilateral) (R>L) 03/09/2019  .  Long term (current) use of anticoagulants 03/09/2019  . Unspecified cirrhosis of liver ([place]) 02/11/2019  . Hypoalbuminemia 02/11/2019  . Edema due to hypoalbuminemia 02/11/2019  . Lower extremity edema 01/26/2019  . Prolonged QT interval 12/17/2018  . Hypomagnesemia 12/17/2018  . Uncontrolled type 2 diabetes mellitus ([place]) 12/17/2018  . H/O TIA (transient ischemic attack) and stroke 12/17/2018  . Personal history of surgery to heart and great vessels, presenting hazards to health 12/17/2018  . DDD (degenerative disc disease), lumbar 12/17/2018  . Lumbar intervertebral disc displacement 12/17/2018  . Chronic musculoskeletal pain 12/17/2018  . Chronic generalized pain 12/17/2018  . Hyponatremia 12/17/2018  . Fibromyalgia syndrome 12/17/2018  . Other intervertebral disc displacement, lumbar region 12/17/2018  . Vitamin D deficiency 11/26/2018  . Elevated C-reactive protein (CRP) 11/26/2018  . Elevated sed rate 11/26/2018  . Chronic low back pain (Primary area of Pain) (Bilateral) (L>R) w/ sciatica (Bilateral) 11/25/2018  . Chronic lower extremity pain (Secondary Area of Pain) (Bilateral) (L>R) 11/25/2018  . Chronic neck pain 11/25/2018  . Pharmacologic therapy 11/25/2018  . Disorder of skeletal system 11/25/2018  . Problems influencing health status 11/25/2018  . Renal mass 10/06/2018  . Chronic intermittent abdominal pain 09/25/2018  . Unspecified abdominal pain 09/25/2018  . Frequent falls 09/12/2018  . Orthostatic hypotension 08/18/2018  . Normochromic normocytic anemia 08/18/2018  . Acute renal failure superimposed on stage 3a chronic kidney disease ([place]) 08/18/2018  . History of hypothyroidism 08/14/2018  . Medical non-compliance 08/14/2018  . Diabetic ketoacidosis ([place]) 05/22/2018  . Urinary tract infection, acute 05/22/2018  . Diarrhea   . NSTEMI (non-ST elevated myocardial infarction) ([place]) 01/11/2016  .  Tachy-brady syndrome ([place]) 12/28/2015  . PAF (paroxysmal atrial  fibrillation) ([place]) 11/24/2015  . Type 2 diabetes mellitus with neurologic complication ([place]) 01/77/9390  . S/P cholecystectomy 11/23/2015  . Narcotic abuse ([place]) 11/22/2015  . Chronic superficial gastritis without bleeding 11/22/2015  . History of Clostridium difficile 11/22/2015  . History of TIA (transient ischemic attack) 11/22/2015  . Mixed hyperlipidemia 11/22/2015  . Diverticulosis 11/22/2015  . History of uterine cancer 11/22/2015  . Hypothyroidism, unspecified 11/22/2015  . Intractable cyclical vomiting with nausea 11/22/2015  . Community acquired pneumonia of left lower lobe of lung 11/14/2015  . Narcotic withdrawal ([place]) 11/11/2015  . Hyperlipidemia 08/31/2015  . Hypotension 08/06/2015  . Cryptogenic cirrhosis ([place]) 07/10/2015  . Atrial fibrillation ([place]) 07/10/2015  . Malnutrition of moderate degree 07/04/2015  . Symptomatic bradycardia 05/27/2015  . Chronic anemia 05/19/2015  . Thrombocytopenia ([place]) 05/19/2015  . Hypokalemia 04/06/2015  . Hyperlipidemia with target LDL less than 100 04/06/2015  . Syncope 03/11/2015  . CVA (cerebral vascular accident) ([place]) 02/15/2015  . Essential hypertension 01/12/2015  . Chronically on opiate therapy 01/12/2015  . Cirrhosis of liver not due to alcohol ([place]) 2016  . S/P Nissen fundoplication (without gastrostomy tube) procedure 05/11/2014  . Dysphagia 04/19/2014  . Myofascial pain syndrome 06/27/2007  . Chronic pain syndrome 03/28/2007  . GERD 03/27/2007  . Diverticulosis of large intestine without perforation or abscess without bleeding 03/27/2007  . Proteinuria 03/27/2007    Orientation RESPIRATION BLADDER Height & Weight     Self,Time,Situation,Place  Normal Continent Weight: 75.8 kg Height:  5' 3"  (160 cm)  BEHAVIORAL SYMPTOMS/MOOD NEUROLOGICAL BOWEL NUTRITION STATUS      Continent Diet (carb modified 1600-2000 cal)  AMBULATORY STATUS COMMUNICATION OF NEEDS Skin   Extensive Assist Verbally Other (Comment) (blisters to  left forearm from IV infiltrate)                       Personal Care Assistance Level of Assistance  Bathing,Feeding,Dressing Bathing Assistance: Maximum assistance Feeding assistance: Limited assistance Dressing Assistance: Maximum assistance     Functional Limitations Info             SPECIAL CARE FACTORS FREQUENCY  PT (By licensed PT),OT (By licensed OT)     PT Frequency: 5 times per week OT Frequency: 5 times per week            Contractures Contractures Info: Not present    Additional Factors Info  Code Status,Allergies Code Status Info: Full Allergies Info: ASA, codeine, erythromycin, prednisone, rosiglitazone maleate, tetanus           Current Medications (12/14/2020):  This is the current hospital active medication list Current Facility-Administered Medications  Medication Dose Route Frequency Provider Last Rate Last Admin  . acetaminophen (TYLENOL) tablet 650 mg  650 mg Oral Q6H PRN Clance Boll, MD       Or  . acetaminophen (TYLENOL) suppository 650 mg  650 mg Rectal Q6H PRN Myles Rosenthal A, MD      . heparin injection 5,000 Units  5,000 Units Subcutaneous Q8H Clance Boll, MD   5,000 Units at 12/14/20 1354  . insulin aspart (novoLOG) injection 0-5 Units  0-5 Units Subcutaneous QHS Dessa Phi, DO      . insulin aspart (novoLOG) injection 0-9 Units  0-9 Units Subcutaneous TID WC Dessa Phi, DO   5 Units at 12/14/20 1354  . insulin glargine (LANTUS) injection 7 Units  7 Units Subcutaneous Daily Dessa Phi, DO      .  lidocaine (LIDODERM) 5 % 1 patch  1 patch Transdermal Q24H Nolberto Hanlon, MD   1 patch at 12/14/20 0917  . metoprolol tartrate (LOPRESSOR) tablet 12.5 mg  12.5 mg Oral BID Nolberto Hanlon, MD   12.5 mg at 12/14/20 0908  . midodrine (PROAMATINE) tablet 5 mg  5 mg Oral TID WC Dessa Phi, DO   5 mg at 12/14/20 1120     Discharge Medications: Please see discharge summary for a list of discharge  medications.  Relevant Imaging Results:  Relevant Lab Results:   Additional Information [id number]  Shelbie Hutching, RN

## 2020-12-14 NOTE — Progress Notes (Signed)
   12/13/20 2201  Assess: MEWS Score  BP (!) 163/79  Pulse Rate (!) 103  Resp 13  SpO2 100 %  Assess: MEWS Score  MEWS Temp 0  MEWS Systolic 0  MEWS Pulse 1  MEWS RR 1  MEWS LOC 0  MEWS Score 2  MEWS Score Color Yellow  Assess: if the MEWS score is Yellow or Red  Were vital signs taken at a resting state? Yes  Focused Assessment No change from prior assessment  Early Detection of Sepsis Score *See Row Information* Medium  MEWS guidelines implemented *See Row Information* No, vital signs rechecked  Notify: Charge Nurse/RN  Name of Charge Nurse/RN Notified Jackie, RN  Date Charge Nurse/RN Notified 12/13/20  Time Charge Nurse/RN Notified 2215  Document  Progress note created (see row info) Yes

## 2020-12-14 NOTE — Progress Notes (Signed)
  Speech Language Pathology Treatment: Dysphagia  Patient Details Name: [name] MRN: [id number] DOB: [birth year] Today's Date: 12/14/2020 Time: 2122-4825 SLP Time Calculation (min) (ACUTE ONLY): 25 min  Assessment / Plan / Recommendation Clinical Impression  Pt's mentation is much improved over previous day. Pt is awake,alert and conversant speaking in full sentences. She voiced concern about not being able to take pain medicine if she isn't able to swallow. During this bedside swallow evaluation pt presents adequate oropharyngeal abilities when consuming thin liquids via cup and straw, puree and graham crackers. While pt wears dentures, they are not with her and she also eats without them most of the time at home. When consuming puree and graham crackers, pt demonstrated adequate oral abilities (with graham crackers, her oral phase was mildly prolonged but still functional and appropriate). When consuming thin liquids via straw, pt's pharyngeal swallow appeared swift and was free of any overt s/s of aspiration. At this time, pt is appropriate for age appropriate regular diet with thin liquids via cup or straw and medicine whole with thin liquids. Skilled ST intervention is not indicated at this time.    HPI HPI: [name] is a 65 y.o. female with medical history significant of hypertension, hyperlipidemia, atrial fibrillation not on anticoagulation due to frequent fall, diabetes, cirrhosis, and chronic orthostatic hypotension on midodrine, history of mild covid infection s/p recovery,who presents to ED BIBEMS due to excessive lethargy/change in mental status. On evaluation in the field patient was found to be severely hypoglycemic and was started on D10 and transported to ED. Mild age-related cerebral volume loss, stable. Patchy  T2/FLAIR hyperintensity within the periventricular and deep white  matter both cerebral hemispheres as well as the pons, most  consistent with chronic small vessel  ischemic disease, also stable.  Superimposed probable small remote lacunar infarct at the central  pons again noted. Small remote right cerebellar and left occipital  cortical infarcts again noted as well, also stable.      SLP Plan  All goals met       Recommendations  Diet recommendations: Regular;Thin liquid Liquids provided via: Cup;Straw Medication Administration: Whole meds with liquid Supervision: Patient able to self feed Compensations: Minimize environmental distractions;Slow rate;Small sips/bites Postural Changes and/or Swallow Maneuvers: Seated upright 90 degrees;Upright 30-60 min after meal                Oral Care Recommendations: Oral care BID Follow up Recommendations: None SLP Visit Diagnosis: Dysphagia, unspecified (R13.10) Plan: All goals met       GO               [name] B. Rutherford Nail M.S., CCC-SLP, [facility] [phone]  [name] Rutherford Nail 12/14/2020, 12:12 PM

## 2020-12-14 NOTE — Progress Notes (Signed)
Inpatient Diabetes Program Recommendations  AACE/ADA: New Consensus Statement on Inpatient Glycemic Control (2015)  Target Ranges:  Prepandial:   less than 140 mg/dL      Peak postprandial:   less than 180 mg/dL (1-2 hours)      Critically ill patients:  140 - 180 mg/dL  Results for [name], [name] (MRN [id number]) as of 12/14/2020 14:12  Ref. Range 12/13/2020 07:24 12/13/2020 09:58 12/13/2020 12:04 12/13/2020 16:31 12/13/2020 20:38 12/14/2020 00:28 12/14/2020 04:41 12/14/2020 07:54 12/14/2020 11:41  Glucose-Capillary Latest Ref Range: 70 - 99 mg/dL 159 (H) 148 (H) 186 (H) 209 (H) 192 (H) 222 (H) 235 (H) 255 (H) 264 (H)   Results for [name], [name] (MRN [id number]) as of 12/14/2020 14:12  Ref. Range 12/12/2020 20:31  INSULIN Latest Ref Range: 2.6 - 24.9 uIU/mL <0.4 (L)  C-Peptide Latest Ref Range: 1.1 - 4.4 ng/mL 0.3 (L)  Results for [name], [name] (MRN [id number]) as of 12/14/2020 14:12  Ref. Range 09/28/2020 06:47 12/13/2020 04:47  Hemoglobin A1C Latest Ref Range: 4.8 - 5.6 % 9.1 (H) 9.3 (H)   Review of Glycemic Control  Diabetes history: DM2 Outpatient Diabetes medications: Levemir 32 units BID Current orders for Inpatient glycemic control: Novolog 0-9 units TID with meals, Novolog 0-5 units QHS  Inpatient Diabetes Program Recommendations:    Insulin: If glucose remains consistently over 180 mg/dl, please consider ordering Lantus 7 units Q24H.  NOTE: Noted insulin and c-peptide levels low indicating patient is not making much insulin. CBGs 148-209 mg/dl on 12/13/20 with no insulin received and 222-264 mg/dl today. Noted Novolog correction insulin ordered today at 10:29 to start at noon today.    Thanks, Barnie Alderman, RN, MSN, CDE Diabetes Coordinator Inpatient Diabetes Program [phone] (Team Pager from 8am to 5pm)

## 2020-12-14 NOTE — Progress Notes (Signed)
Primary RN informed of the Pharmacy's note to please leave the long acting insulin as scheduled versus moving it to bedtime.

## 2020-12-14 NOTE — Evaluation (Signed)
Occupational Therapy Evaluation Patient Details Name: [name] MRN: [id number] DOB: [birth year] Today's Date: 12/14/2020    History of Present Illness [name] is a 65 y.o. female with medical history significant of   history of hypertension, hyperlipidemia, atrial fibrillation not on anticoagulation due to frequent fall, diabetes, cirrhosis, and chronic orthostatic hypotension on midodrine, history of mild covid infection s/p recovery,who presents to ED BIBEMS due to excessive lethargy/change in mental status. ON evaluation in the field patient was found to be severely hypoglycemic and was started on D10 and transported to ED. Patient states she has no recollection of what occurred prior to presenting to ED. She does note chronic back pain, but notes no f/c/n/v/d/or abdominal pain or cough or sob.  IN ed on initial evaluation neuro exam non-focal other than for noted encephalopathy fs ranged from 50;s-200s after treatment. Currently on questioning does not know her name and is not oriented. Not able to give further history   Clinical Impression   [name] was seen for OT/PT co-evaluation this date. Prior to hospital admission, pt was MOD I for mobility using RW, per chart review. Pt lives c husband, daughter, and 29yo grandson in mobile home with ramped entrance. Pt reports significant falls history. Pt presents to acute OT demonstrating impaired ADL performance and functional mobility 2/2 decreased activity tolerance, functional strength/ROM/balance deficits, and poor insight into deficits. Pt unable to state name but shows good awareness of situation - remembers 2/3 of husbands phone number and tells him her arm was infiltrated by IV.  Pt's L arm is swollen and blistered 2/2 IV infiltration - pt reports numbness and grossly 2/5 strength. Pt currently requires MAX A don B socks/gown at bed level. MAX A toileting at bed level. MOD A for bed mobility, pt unable to tolerate static sitting EOB  to advance to seated ADLs. Reports dizziness in sitting, positive orthostatics. Pt would benefit from skilled OT to address noted impairments and functional limitations (see below for any additional details) in order to maximize safety and independence while minimizing falls risk and caregiver burden. Upon hospital discharge, recommend  STR to maximize pt safety and return to PLOF.  Orthostatic Vitals  Lying: BP 150/71, MAP 95, HR75 Sitting: BP 129/61, MAP 81, HR 83 +dizziness and self-initiated return to supine     Follow Up Recommendations  SNF    Equipment Recommendations  Other (comment) (TBD next venue of care)    Recommendations for Other Services       Precautions / Restrictions Precautions Precautions: Fall Restrictions Weight Bearing Restrictions: No      Mobility Bed Mobility Overal bed mobility: Needs Assistance Bed Mobility: Supine to Sit;Sit to Supine;Rolling Rolling: Mod assist   Supine to sit: Mod assist Sit to supine: Min assist   General bed mobility comments: Patient requires min/mod assist for bed mobility due to pain in back and limited use of L UE. Patient reports dizziness upon sitting up on side of bed. Patient Orhtostatic    Transfers                 General transfer comment: not attempted due to patient dizziness    Balance Overall balance assessment: Modified Independent                                         ADL either performed or assessed with clinical judgement  ADL Overall ADL's : Needs assistance/impaired                                       General ADL Comments: MAX A don gown and B socks at bed level. MIN A self-drinking at bed level. Unable to tolerate static sitting EOB to advance to seated ADLs                  Pertinent Vitals/Pain Pain Assessment: Faces Faces Pain Scale: Hurts little more Pain Location: Back, left arm Pain Descriptors / Indicators: Discomfort Pain  Intervention(s): Limited activity within patient's tolerance;Repositioned     Hand Dominance Right   Extremity/Trunk Assessment Upper Extremity Assessment Upper Extremity Assessment: LUE deficits/detail LUE Deficits / Details: L UE swollen and blistered due to infiltration, patient reports L UE numb. Grossly 2-/5 LUE: Unable to fully assess due to pain LUE Sensation: decreased light touch LUE Coordination: decreased gross motor   Lower Extremity Assessment Lower Extremity Assessment: Generalized weakness       Communication Communication Communication: No difficulties   Cognition Arousal/Alertness: Awake/alert Behavior During Therapy: WFL for tasks assessed/performed Overall Cognitive Status: No family/caregiver present to determine baseline cognitive functioning                                 General Comments: Pt unable to state name, is aware of infiltrated arm and vaguely aware of "sugar issues."   General Comments       Exercises Exercises: Other exercises Other Exercises Other Exercises: Pt educated re: OT role, DME recs, d/c recs, falls prevention, positioning for edema mgmt Other Exercises: LBD, UBD, sup<>sit, sitting balance/tolerance, self drinking, toileting   Shoulder Instructions      Home Living Family/patient expects to be discharged to:: Private residence Living Arrangements: Spouse/significant other Available Help at Discharge: Family;Available 24 hours/day Type of Home: Mobile home Home Access: Ramped entrance     Home Layout: One level     Bathroom Shower/Tub: Tub/shower unit;Curtain   Bathroom Toilet: Standard Bathroom Accessibility: Yes   Home Equipment: Environmental consultant - 2 wheels;Bedside commode;Shower seat;Wheelchair - manual   Additional Comments: Per chart review, husband has a disability      Prior Functioning/Environment Level of Independence: Needs assistance;Independent with assistive device(s)  Gait / Transfers Assistance  Needed: Pt reports using a RW for functional mobility within the home. Pt was given a wheelchair following last hospital admission but states that she has not used it since initial arrival home. Pt endorses 30 falls within the past 6 months, with pt stating that she is often dizzy before the fall ( taken from prior admission 2 months ago) ADL's / Homemaking Assistance Needed: Pt reports being independent with dressing. Pt states that she takes baths to wash up            OT Problem List: Decreased strength;Decreased range of motion;Decreased activity tolerance;Impaired balance (sitting and/or standing);Decreased safety awareness;Decreased cognition      OT Treatment/Interventions: Self-care/ADL training;Therapeutic exercise;Energy conservation;DME and/or AE instruction;Therapeutic activities;Patient/family education;Balance training    OT Goals(Current goals can be found in the care plan section) Acute Rehab OT Goals Patient Stated Goal: none stated OT Goal Formulation: With patient Time For Goal Achievement: 12/28/20 Potential to Achieve Goals: Good ADL Goals Pt Will Perform Grooming: with min assist;sitting Pt Will Perform Upper Body Dressing: with  min assist;sitting Pt Will Transfer to Toilet: with min assist;with +2 assist;stand pivot transfer;bedside commode (c LRAD PRN)  OT Frequency: Min 1X/week   Barriers to D/C: Inaccessible home environment;Decreased caregiver support          Co-evaluation PT/OT/SLP Co-Evaluation/Treatment: Yes Reason for Co-Treatment: Necessary to address cognition/behavior during functional activity;To address functional/ADL transfers PT goals addressed during session: Mobility/safety with mobility OT goals addressed during session: ADL's and self-care      AM-PAC OT "6 Clicks" Daily Activity     Outcome Measure Help from another person eating meals?: A Little Help from another person taking care of personal grooming?: A Little Help from another  person toileting, which includes using toliet, bedpan, or urinal?: A Lot Help from another person bathing (including washing, rinsing, drying)?: A Lot Help from another person to put on and taking off regular upper body clothing?: A Lot Help from another person to put on and taking off regular lower body clothing?: A Lot 6 Click Score: 14   End of Session Nurse Communication: Mobility status  Activity Tolerance: Patient limited by fatigue Patient left: in bed;with call bell/phone within reach;with bed alarm set  OT Visit Diagnosis: Other abnormalities of gait and mobility (R26.89);Muscle weakness (generalized) (M62.81)                Time: 8003-4917 OT Time Calculation (min): 20 min Charges:  OT General Charges $OT Visit: 1 Visit OT Evaluation $OT Eval Low Complexity: 1 Low OT Treatments $Self Care/Home Management : 8-22 mins  Dessie Coma, M.S. OTR/L  12/14/20, 2:52 PM  ascom [phone]

## 2020-12-14 NOTE — Consult Note (Signed)
WOC Nurse Consult Note: Remote consult completed.  Reason for Consult:Extravasation noted to left AC IV site. Bedside RN removed IV and applied warm moist compress  Medication reconciliation reveals that patient has received several doses of vasodilator, apresoline.  Will implement topical orders and bedside RN should establish new IV site, preferably not in the antecubital space.  Wound type:Extravasation injury with edema, erythema and blistering Pressure Injury POA: NA Measurement: Bedside RN to measure site and mark erythema with an indelible marker for a line of demarcation.  RN has not answered phone calls, will keep trying to reach.  Wound PXT:GGYIR, weeping, blistered.  Drainage (amount, consistency, odor) weeping Periwound:edema Dressing procedure/placement/frequency: Cleanse left AC infiltration site with NS and pat dry. Apply Xeroform gauze and cover with dry gauze and kerilx/tape.  Change every other day.  Please mark erythematous border with a marker and add measurements to flow sheet.  Will not follow at this time.  Please re-consult if needed.  Domenic Moras MSN, RN, FNP-BC CWON Wound, Ostomy, Continence Nurse Pager [phone]

## 2020-12-14 NOTE — Progress Notes (Signed)
PROGRESS NOTE    [name]  BWL:[id number] DOB: 1956/07/21 DOA: 12/12/2020 PCP: [name], NP     Brief Narrative:  Rita Lebeck Pooleis a 65 y.o.femalewith medical history significant of history of hypertension, hyperlipidemia, atrial fibrillationnot on anticoagulation due to frequent fall,diabetes, cirrhosis, and chronic orthostatic hypotension on midodrine, history of mild covid infection s/p recovery,who presents to ED by EMS due to excessive lethargy/change in mental status. She was found to be severely hypoglycemic and was started on D10 and transported to ED. Patient states she has no recollection of what occurred prior to presenting to ED. She does note chronic back pain, but is currently on drug holiday and has not taken oxycodone in couple of weeks. Patient was admitted to acute metabolic encephalopathy.   New events last 24 hours / Subjective: Patient is alert, able to give accurate history in regards to her chronic back pain, current trial of drug holiday off oxycodone, but has to check her wristband to check her name. She is not oriented to place or year. She is able to tell me her husband's name, that they have been married 25 years, she has 1 son outside of this current marriage, named Harrell Gave who lives in [place]. She also has a grandson Barnabas Lister who is 21 years old.   Assessment & Plan:   Principal Problem:   Acute encephalopathy Active Problems:   Chronic pain syndrome   Atrial fibrillation (HCC)   Hypotension   Frequent falls   Chronic musculoskeletal pain   GAD (generalized anxiety disorder)   Cirrhosis of liver not due to alcohol (HCC)   Hypoglycemia   CKD (chronic kidney disease), stage III (HCC)   Acute metabolic encephalopathy -Secondary to hypoglycemia as well as polypharmacy  -Neuro consulted, believed her overall presentation most consistent with medication toxicity versus soporific effects of illicit substance use, +/- neurotoxic effects of  possible severe hypoglycemic episode at home -EEG: This study is suggestive of mild diffuse encephalopathy, nonspecific etiology. No seizures or epileptiform discharges were seen throughout the recording -Ammonia 29  -Improving   DM type 2, insulin-dependent, with hypoglycemia -Ha1c pending  -Resolved -SSI   Paroxysmal A Fib -Not on anticoagulation due to frequent falls -Continue metoprolol   Chronic hypotension -Continue midodrine  CKD stage 3a -Stable   Left upper extremity swelling -Secondary to IV infiltration -Palpable pulses present -Venous duplex ordered      DVT prophylaxis:  heparin injection 5,000 Units Start: 12/12/20 2200  Code Status: Full Family Communication: None at bedside  Disposition Plan:  Status is: Inpatient  Remains inpatient appropriate because:Inpatient level of care appropriate due to severity of illness   Dispo: The patient is from: Home              Anticipated d/c is to: SNF              Patient currently is not medically stable to d/c. Mentation not back to baseline. SNF placement pending.    Difficult to place patient No    Antimicrobials:  Anti-infectives (From admission, onward)   Start     Dose/Rate Route Frequency Ordered Stop   12/12/20 1745  Ampicillin-Sulbactam (UNASYN) 3 g in sodium chloride 0.9 % 100 mL IVPB        3 g 200 mL/hr over 30 Minutes Intravenous  Once 12/12/20 1738 12/12/20 1850        Objective: Vitals:   12/14/20 0438 12/14/20 0600 12/14/20 0700 12/14/20 1100  BP: (!) 186/94 122/75 Marland Kitchen)  153/72 (!) 146/65  Pulse: 93 (!) 101 98 76  Resp: 15  (!) 22 17  Temp: 98.1 F (36.7 C)     TempSrc:      SpO2: 99% 100% 100% 100%  Weight:      Height:        Intake/Output Summary (Last 24 hours) at 12/14/2020 1119 Last data filed at 12/14/2020 1029 Gross per 24 hour  Intake 2594.62 ml  Output 275 ml  Net 2319.62 ml   Filed Weights   12/12/20 1608  Weight: 75.8 kg    Examination:  General exam: Appears  calm and comfortable  Respiratory system: Clear to auscultation. Respiratory effort normal. No respiratory distress. No conversational dyspnea.  Cardiovascular system: S1 & S2 heard, RRR. No murmurs. No pedal edema. Gastrointestinal system: Abdomen is nondistended, soft and nontender. Normal bowel sounds heard. Central nervous system: Alert but not oriented, nonfocal exam, speech clear Extremities: LUE swelling +pulses  Skin: +erythema and blistering over left antecubital fossa in shape of tape  Psychiatry: Stable   Data Reviewed: I have personally reviewed following labs and imaging studies  CBC: Recent Labs  Lab 12/12/20 1616 12/12/20 2031 12/13/20 0447  WBC 7.2 6.9 5.0  NEUTROABS 6.3  --   --   HGB 10.8* 9.5* 9.7*  HCT 35.1* 30.3* 31.0*  MCV 85.8 85.4 85.6  PLT 128* 133* 939*   Basic Metabolic Panel: Recent Labs  Lab 12/12/20 1616 12/12/20 2031 12/13/20 0704  NA 138  --  141  K 3.3*  --  4.4  CL 113*  --  116*  CO2 19*  --  18*  GLUCOSE 234*  --  177*  BUN 20  --  19  CREATININE 1.02* 1.07* 1.05*  CALCIUM 7.5*  --  8.2*   GFR: Estimated Creatinine Clearance: 51.4 mL/min (A) (by C-G formula based on SCr of 1.05 mg/dL (H)). Liver Function Tests: Recent Labs  Lab 12/12/20 1616 12/13/20 0704  AST 33 29  ALT 21 18  ALKPHOS 111 122  BILITOT 0.8 0.9  PROT 6.3* 6.0*  ALBUMIN 3.2* 3.0*   No results for input(s): LIPASE, AMYLASE in the last 168 hours. Recent Labs  Lab 12/12/20 2138  AMMONIA 29   Coagulation Profile: No results for input(s): INR, PROTIME in the last 168 hours. Cardiac Enzymes: No results for input(s): CKTOTAL, CKMB, CKMBINDEX, TROPONINI in the last 168 hours. BNP (last 3 results) No results for input(s): PROBNP in the last 8760 hours. HbA1C: No results for input(s): HGBA1C in the last 72 hours. CBG: Recent Labs  Lab 12/13/20 1631 12/13/20 2038 12/14/20 0028 12/14/20 0441 12/14/20 0754  GLUCAP 209* 192* 222* 235* 255*   Lipid  Profile: No results for input(s): CHOL, HDL, LDLCALC, TRIG, CHOLHDL, LDLDIRECT in the last 72 hours. Thyroid Function Tests: Recent Labs    12/12/20 2031  TSH 2.886   Anemia Panel: No results for input(s): VITAMINB12, FOLATE, FERRITIN, TIBC, IRON, RETICCTPCT in the last 72 hours. Sepsis Labs: No results for input(s): PROCALCITON, LATICACIDVEN in the last 168 hours.  Recent Results (from the past 240 hour(s))  SARS CORONAVIRUS 2 (TAT 6-24 HRS) Nasopharyngeal     Status: None   Collection Time: 12/12/20  5:45 PM   Specimen: Nasopharyngeal  Result Value Ref Range Status   SARS Coronavirus 2 NEGATIVE NEGATIVE Final    Comment: (NOTE) SARS-CoV-2 target nucleic acids are NOT DETECTED.  The SARS-CoV-2 RNA is generally detectable in upper and lower respiratory specimens during the acute  phase of infection. Negative results do not preclude SARS-CoV-2 infection, do not rule out co-infections with other pathogens, and should not be used as the sole basis for treatment or other patient management decisions. Negative results must be combined with clinical observations, patient history, and epidemiological information. The expected result is Negative.  Fact Sheet for Patients: SugarRoll.be  Fact Sheet for Healthcare Providers: https://www.woods-mathews.com/  This test is not yet approved or cleared by the [place] FDA and  has been authorized for detection and/or diagnosis of SARS-CoV-2 by FDA under an Emergency Use Authorization (EUA). This EUA will remain  in effect (meaning this test can be used) for the duration of the COVID-19 declaration under Se ction 564(b)(1) of the Act, 21 U.S.C. section 360bbb-3(b)(1), unless the authorization is terminated or revoked sooner.  Performed at [facility], [place] [address] [place], [place] [address]       Radiology Studies: CT Head Wo Contrast  Result Date: 12/12/2020 CLINICAL DATA:   65 year old female with history of syncope and confusion on Eliquis. Evaluate for intracranial hemorrhage. EXAM: CT HEAD WITHOUT CONTRAST TECHNIQUE: Contiguous axial images were obtained from the base of the skull through the vertex without intravenous contrast. COMPARISON:  Head CT 11/09/2020. FINDINGS: Brain: Mild cerebral atrophy. Patchy and confluent areas of decreased attenuation are noted throughout the deep and periventricular white matter of the cerebral hemispheres bilaterally, compatible with chronic microvascular ischemic disease. No evidence of acute infarction, hemorrhage, hydrocephalus, extra-axial collection or mass lesion/mass effect. Vascular: No hyperdense vessel or unexpected calcification. Skull: Normal. Negative for fracture or focal lesion. Sinuses/Orbits: Paranasal sinuses are clear. Right mastoid is well pneumatized. Large left mastoid effusion. Other: None. IMPRESSION: 1. No acute intracranial abnormality. Specifically, no evidence of acute intracranial hemorrhage. 2. Large left mastoid effusion. Clinical correlation for signs and symptoms of mastoiditis is recommended. 3. Mild cerebral atrophy with chronic microvascular ischemic changes in the cerebral white matter, as above. Electronically Signed   By: Vinnie Langton M.D.   On: 12/12/2020 16:45   MR BRAIN WO CONTRAST  Result Date: 12/12/2020 CLINICAL DATA:  Initial evaluation for memory loss, mental status change. EXAM: MRI HEAD WITHOUT CONTRAST TECHNIQUE: Multiplanar, multiecho pulse sequences of the brain and surrounding structures were obtained without intravenous contrast. COMPARISON:  Prior CT from earlier the same day as well as previous MRI from 09/27/2020. FINDINGS: Brain: Mild age-related cerebral volume loss, stable. Patchy T2/FLAIR hyperintensity within the periventricular and deep white matter both cerebral hemispheres as well as the pons, most consistent with chronic small vessel ischemic disease, also stable.  Superimposed probable small remote lacunar infarct at the central pons again noted. Small remote right cerebellar and left occipital cortical infarcts again noted as well, also stable. No abnormal foci of restricted diffusion to suggest acute or subacute ischemia. Gray-white matter differentiation otherwise maintained. No other areas of encephalomalacia to suggest chronic cortical infarction. No acute or chronic intracranial hemorrhage. No mass lesion, mass effect, or midline shift. No hydrocephalus or extra-axial fluid collection. Pituitary gland and suprasellar region normal. Vascular: Mildly increased FLAIR hyperintensity within the left transverse sinus without T1 correlate, likely reflecting slow flow. Major intracranial vascular flow voids are otherwise maintained. Skull and upper cervical spine: Craniocervical junction within normal limits. Bone marrow signal intensity normal. No scalp soft tissue abnormality. Sinuses/Orbits: Globes and orbital soft tissues within normal limits. Mild scattered mucosal thickening noted within the ethmoidal air cells. Paranasal sinuses are otherwise clear. Chronic left mastoid and middle ear effusion, stable from  previous. Visualized nasopharynx within normal limits. Other: None. IMPRESSION: 1. No acute intracranial abnormality. 2. Age-related cerebral atrophy with mild-to-moderate chronic microvascular ischemic disease, with a few scattered small remote ischemic infarcts as above, stable. 3. Chronic left mastoid and middle ear effusion, stable from previous. Electronically Signed   By: Jeannine Boga M.D.   On: 12/12/2020 19:49   DG Chest Portable 1 View  Result Date: 12/12/2020 CLINICAL DATA:  Hyperglycemia.  Altered mental status. EXAM: PORTABLE CHEST 1 VIEW COMPARISON:  11/01/2020 FINDINGS: Shallow inspiration. Heart size and pulmonary vascularity are probably normal for inspiratory effort. No airspace disease or consolidation in the lungs. Vascular crowding. No  pleural effusions. No pneumothorax. Mediastinal contours appear intact. IMPRESSION: Shallow inspiration.  No evidence of active pulmonary disease. Electronically Signed   By: Lucienne Capers M.D.   On: 12/12/2020 19:59   EEG adult  Result Date: 12/13/2020 Lora Havens, MD     12/13/2020  4:07 PM Patient Name: HILDUR BAYER MRN: [id number] Epilepsy Attending: Lora Havens Referring Physician/Provider: Dr Myles Rosenthal Date: 12/13/2020 Duration: 23.24 mins Patient history: 65yo F with ams. EEG to evaluate for seizure Level of alertness: Awake, asleep AEDs during EEG study: None Technical aspects: This EEG study was done with scalp electrodes positioned according to the 10-20 International system of electrode placement. Electrical activity was acquired at a sampling rate of 500Hz  and reviewed with a high frequency filter of 70Hz  and a low frequency filter of 1Hz . EEG data were recorded continuously and digitally stored. Description: The posterior dominant rhythm consists of 7-8 Hz activity of moderate voltage (25-35 uV) seen predominantly in posterior head regions, symmetric and reactive to eye opening and eye closing. Sleep was characterized by vertex waves, sleep spindles (12 to 14 Hz), maximal frontocentral region. EEG also showed continuous generalized 5-6 Hz theta slowing. Physiologic photic driving was not seen during photic stimulation.  Hyperventilation was not performed.   ABNORMALITY - Continuous slow, generalized IMPRESSION: This study is suggestive of mild diffuse encephalopathy, nonspecific etiology. No seizures or epileptiform discharges were seen throughout the recording. Priyanka Barbra Sarks      Scheduled Meds: . heparin  5,000 Units Subcutaneous Q8H  . insulin aspart  0-5 Units Subcutaneous QHS  . insulin aspart  0-9 Units Subcutaneous TID WC  . lidocaine  1 patch Transdermal Q24H  . metoprolol tartrate  12.5 mg Oral BID  . midodrine  5 mg Oral TID WC  . sertraline  50 mg Oral  Daily   Continuous Infusions:   LOS: 2 days      Time spent: 45 minutes   Dessa Phi, DO [facility] 12/14/2020, 11:19 AM   Available via Epic secure chat 7am-7pm After these hours, please refer to coverage provider listed on [email or web address]

## 2020-12-15 DIAGNOSIS — E162 Hypoglycemia, unspecified: Secondary | ICD-10-CM | POA: Diagnosis not present

## 2020-12-15 LAB — GLUCOSE, CAPILLARY
Glucose-Capillary: 182 mg/dL — ABNORMAL HIGH (ref 70–99)
Glucose-Capillary: 185 mg/dL — ABNORMAL HIGH (ref 70–99)
Glucose-Capillary: 135 mg/dL — ABNORMAL HIGH (ref 70–99)
Glucose-Capillary: 152 mg/dL — ABNORMAL HIGH (ref 70–99)
Glucose-Capillary: 139 mg/dL — ABNORMAL HIGH (ref 70–99)

## 2020-12-15 LAB — CBC
HCT: 32.1 % — ABNORMAL LOW (ref 36.0–46.0)
Hemoglobin: 10.1 g/dL — ABNORMAL LOW (ref 12.0–15.0)
MCH: 26.6 pg (ref 26.0–34.0)
MCHC: 31.5 g/dL (ref 30.0–36.0)
MCV: 84.7 fL (ref 80.0–100.0)
Platelets: 151 10*3/uL (ref 150–400)
RBC: 3.79 MIL/uL — ABNORMAL LOW (ref 3.87–5.11)
RDW: 18.6 % — ABNORMAL HIGH (ref 11.5–15.5)
WBC: 5.2 10*3/uL (ref 4.0–10.5)
nRBC: 0 % (ref 0.0–0.2)

## 2020-12-15 LAB — BASIC METABOLIC PANEL
Anion gap: 7 (ref 5–15)
BUN: 18 mg/dL (ref 8–23)
CO2: 22 mmol/L (ref 22–32)
Calcium: 8.7 mg/dL — ABNORMAL LOW (ref 8.9–10.3)
Chloride: 110 mmol/L (ref 98–111)
Creatinine, Ser: 1.18 mg/dL — ABNORMAL HIGH (ref 0.44–1.00)
GFR, Estimated: 51 mL/min — ABNORMAL LOW (ref >60)
Glucose, Bld: 196 mg/dL — ABNORMAL HIGH (ref 70–99)
Potassium: 3.8 mmol/L (ref 3.5–5.1)
Sodium: 139 mmol/L (ref 135–145)

## 2020-12-15 MED ORDER — POLYETHYLENE GLYCOL 3350 17 G PO PACK
17.0000 g | PACK | Freq: Every day | ORAL | Status: DC
  Filled 2020-12-15 (×2): qty 1

## 2020-12-15 MED ORDER — SERTRALINE HCL 50 MG PO TABS
50.0000 mg | ORAL_TABLET | Freq: Every day | ORAL | Status: DC
  Administered 2020-12-15 – 2020-12-17 (×3): 50 mg via ORAL
  Filled 2020-12-15 (×3): qty 1

## 2020-12-15 MED ORDER — ENOXAPARIN SODIUM 40 MG/0.4ML ~~LOC~~ SOLN
40.0000 mg | SUBCUTANEOUS | Status: DC
  Administered 2020-12-15 – 2020-12-16 (×2): 40 mg via SUBCUTANEOUS
  Filled 2020-12-15 (×2): qty 0.4

## 2020-12-15 MED ORDER — LABETALOL HCL 5 MG/ML IV SOLN: 10.0000 mg | INTRAVENOUS | Status: DC | PRN

## 2020-12-15 NOTE — Progress Notes (Signed)
Physical Therapy Treatment Patient Details Name: [name] MRN: [id number] DOB: June 21, 1956 Today's Date: 12/15/2020    History of Present Illness Pt is a 65 y.o. female with medical history significant of   history of hypertension, hyperlipidemia, atrial fibrillation not on anticoagulation due to frequent fall, diabetes, cirrhosis, and chronic orthostatic hypotension on midodrine, history of mild covid infection s/p recovery,who presents to ED BIBEMS due to excessive lethargy/change in mental status. On evaluation in the field patient was found to be severely hypoglycemic and was started on D10 and transported to ED. MD assessment includes metabolic encephalopathy and hypoglycemia.      PT Comments    Pt was pleasant and motivated to participate during the session. Orthostatic BP's taken as follows: supine 149/73 with HR 96, sitting 126/71 with HR 99, standing 112/37 with HR 108.  Pt was asymptomatic throughout the session with no adverse symptoms other than her chronic back pain.  Pt ambulated from bed to chair but further ambulation deferred secondary to orthostatic BP, nursing notified.  Pt will benefit from PT services in a SNF setting upon discharge to safely address deficits listed in patient problem list for decreased caregiver assistance and eventual return to PLOF.     Follow Up Recommendations  SNF     Equipment Recommendations  Other (comment) (TBD at next venue of care)    Recommendations for Other Services       Precautions / Restrictions Precautions Precautions: Fall Restrictions Weight Bearing Restrictions: No    Mobility  Bed Mobility Overal bed mobility: Needs Assistance Bed Mobility: Supine to Sit     Supine to sit: Modified independent (Device/Increase time)     General bed mobility comments: Extra time and effort only, no physical assist needed this session    Transfers Overall transfer level: Needs assistance Equipment used: Rolling walker (2  wheeled) Transfers: Sit to/from Stand Sit to Stand: Min guard         General transfer comment: Min verbal cues for hand placement  Ambulation/Gait Ambulation/Gait assistance: Min guard Gait Distance (Feet): 3 Feet Assistive device: Rolling walker (2 wheeled) Gait Pattern/deviations: Step-through pattern;Decreased step length - right;Decreased step length - left Gait velocity: decreased   General Gait Details: Amb from bed to chair only secondary to orthostatic hypotension down to 112/37 in standing from 149/73 in supine, nursing notified   Stairs             Wheelchair Mobility    Modified Rankin (Stroke Patients Only)       Balance Overall balance assessment: Needs assistance   Sitting balance-Leahy Scale: Normal     Standing balance support: Bilateral upper extremity supported;During functional activity Standing balance-Leahy Scale: Good Standing balance comment: min lean on the RW for support                            Cognition Arousal/Alertness: Awake/alert Behavior During Therapy: WFL for tasks assessed/performed Overall Cognitive Status: Within Functional Limits for tasks assessed                                        Exercises Total Joint Exercises Ankle Circles/Pumps: AROM;Strengthening;Both;10 reps Quad Sets: 10 reps;Both;Strengthening Gluteal Sets: Strengthening;Both;10 reps Heel Slides: AROM;Strengthening;Both;5 reps Hip ABduction/ADduction: AROM;Strengthening;Both;5 reps Long Arc Quad: AROM;Strengthening;Both;10 reps Knee Flexion: 10 reps;Both;Strengthening;AROM Other Exercises Other Exercises: HEP education for BLE APs,  QS, GS, and LAQs x 10    General Comments        Pertinent Vitals/Pain Pain Assessment: 0-10 Pain Score: 8  Pain Location: low back Pain Descriptors / Indicators: Sore;Aching Pain Intervention(s): Premedicated before session;Monitored during session;Repositioned    Home Living                       Prior Function            PT Goals (current goals can now be found in the care plan section) Progress towards PT goals: Progressing toward goals    Frequency    Min 2X/week      PT Plan Current plan remains appropriate    Co-evaluation              AM-PAC PT "6 Clicks" Mobility   Outcome Measure  Help needed turning from your back to your side while in a flat bed without using bedrails?: A Little Help needed moving from lying on your back to sitting on the side of a flat bed without using bedrails?: A Little Help needed moving to and from a bed to a chair (including a wheelchair)?: A Little Help needed standing up from a chair using your arms (e.g., wheelchair or bedside chair)?: A Little Help needed to walk in hospital room?: A Lot Help needed climbing 3-5 steps with a railing? : A Lot 6 Click Score: 16    End of Session Equipment Utilized During Treatment: Gait belt Activity Tolerance: Other (comment) (Amb deferred secondary to orthostatic hypotension) Patient left: in chair;with call bell/phone within reach;with chair alarm set Nurse Communication: Mobility status;Other (comment) (orthostatic BP's) PT Visit Diagnosis: Other abnormalities of gait and mobility (R26.89);Muscle weakness (generalized) (M62.81);Dizziness and giddiness (R42);Repeated falls (R29.6);Pain Pain - part of body:  (central low back)     Time: 0092-3300 PT Time Calculation (min) (ACUTE ONLY): 24 min  Charges:  $Therapeutic Exercise: 8-22 mins $Therapeutic Activity: 8-22 mins                     D. Scott Ndidi Nesby PT, DPT 12/15/20, 1:54 PM

## 2020-12-15 NOTE — Progress Notes (Signed)
PROGRESS NOTE    [name]  RJJ:[id number] DOB: 10/05/1955 DOA: 12/12/2020 PCP: [name], NP     Brief Narrative:  Rita General Pooleis a 65 y.o.femalewith medical history significant of history of hypertension, hyperlipidemia, atrial fibrillationnot on anticoagulation due to frequent fall,diabetes, cirrhosis, and chronic orthostatic hypotension on midodrine, history of mild covid infection s/p recovery,who presents to ED by EMS due to excessive lethargy/change in mental status. She was found to be severely hypoglycemic and was started on D10 and transported to ED. Patient states she has no recollection of what occurred prior to presenting to ED. She does note chronic back pain, but is currently on drug holiday and has not taken oxycodone in couple of weeks. Patient was admitted to acute metabolic encephalopathy, thought to be secondary to hypoglycemia and polypharmacy.   New events last 24 hours / Subjective: Patient's mentation returned back to normal this morning, able to answer all questions appropriately, oriented x3.  She states that she is feeling much better, left arm pain and mobility is improving as well.  Ate breakfast without any issues  Assessment & Plan:   Principal Problem:   Acute encephalopathy Active Problems:   Chronic pain syndrome   Atrial fibrillation (HCC)   Hypotension   Frequent falls   Chronic musculoskeletal pain   GAD (generalized anxiety disorder)   Cirrhosis of liver not due to alcohol (HCC)   Hypoglycemia   CKD (chronic kidney disease), stage III (HCC)   Acute metabolic encephalopathy -Secondary to hypoglycemia as well as polypharmacy  -Neuro consulted, believed her overall presentation most consistent with medication toxicity versus soporific effects of illicit substance use, +/- neurotoxic effects of possible severe hypoglycemic episode at home -EEG: This study is suggestive of mild diffuse encephalopathy, nonspecific etiology. No  seizures or epileptiform discharges were seen throughout the recording -Ammonia 29  -Resolved, back to baseline  DM type 2, insulin-dependent, uncontrolled with hyperglycemia as well as episodes of hypoglycemia -Ha1c 9.3 -Resolved -Lantus, SSI  -Discussed reducing long-acting insulin dosing at time of discharge to avoid hypoglycemic episodes  Paroxysmal A Fib -Not on anticoagulation due to frequent falls -Continue metoprolol   Chronic hypotension -Continue midodrine  CKD stage 3a -Stable   Left upper extremity swelling -Secondary to IV infiltration -Palpable pulses present -Venous duplex negative for DVT -Improving     DVT prophylaxis:  heparin injection 5,000 Units Start: 12/12/20 2200  Code Status: Full Family Communication: None at bedside, updated husband over the phone Disposition Plan:  Status is: Inpatient  Remains inpatient appropriate because:Inpatient level of care appropriate due to severity of illness   Dispo: The patient is from: Home              Anticipated d/c is to: SNF versus home with home health              Patient currently is not medically stable to d/c.  Continue physical therapy evaluation today, patient deciding SNF versus home with home health PT   Difficult to place patient No    Antimicrobials:  Anti-infectives (From admission, onward)   Start     Dose/Rate Route Frequency Ordered Stop   12/12/20 1745  Ampicillin-Sulbactam (UNASYN) 3 g in sodium chloride 0.9 % 100 mL IVPB        3 g 200 mL/hr over 30 Minutes Intravenous  Once 12/12/20 1738 12/12/20 1850       Objective: Vitals:   12/14/20 1239 12/14/20 1940 12/14/20 2346 12/15/20 0828  BP: Marland Kitchen)  147/71 140/69 (!) 154/65 137/69  Pulse: 72 79 75 92  Resp: 16 16 16 18   Temp: 97.8 F (36.6 C) 98.1 F (36.7 C) 98.5 F (36.9 C) 98.8 F (37.1 C)  TempSrc:  Oral Oral   SpO2: 97% 99% 100% 98%  Weight:      Height:        Intake/Output Summary (Last 24 hours) at 12/15/2020  1032 Last data filed at 12/15/2020 1025 Gross per 24 hour  Intake 600 ml  Output 1250 ml  Net -650 ml   Filed Weights   12/12/20 1608  Weight: 75.8 kg    Examination: General exam: Appears calm and comfortable  Respiratory system: Clear to auscultation. Respiratory effort normal. Cardiovascular system: S1 & S2 heard, RRR. No pedal edema. Gastrointestinal system: Abdomen is nondistended, soft and nontender. Normal bowel sounds heard. Central nervous system: Alert and oriented. Non focal exam. Speech clear  Extremities: Left upper extremity edema much improved from examination yesterday, still some swelling present in the left elbow and upper arm Psychiatry: Judgement and insight appear stable. Mood & affect appropriate.    Data Reviewed: I have personally reviewed following labs and imaging studies  CBC: Recent Labs  Lab 12/12/20 1616 12/12/20 2031 12/13/20 0447 12/15/20 0425  WBC 7.2 6.9 5.0 5.2  NEUTROABS 6.3  --   --   --   HGB 10.8* 9.5* 9.7* 10.1*  HCT 35.1* 30.3* 31.0* 32.1*  MCV 85.8 85.4 85.6 84.7  PLT 128* 133* 110* 827   Basic Metabolic Panel: Recent Labs  Lab 12/12/20 1616 12/12/20 2031 12/13/20 0704 12/15/20 0425  NA 138  --  141 139  K 3.3*  --  4.4 3.8  CL 113*  --  116* 110  CO2 19*  --  18* 22  GLUCOSE 234*  --  177* 196*  BUN 20  --  19 18  CREATININE 1.02* 1.07* 1.05* 1.18*  CALCIUM 7.5*  --  8.2* 8.7*   GFR: Estimated Creatinine Clearance: 45.8 mL/min (A) (by C-G formula based on SCr of 1.18 mg/dL (H)). Liver Function Tests: Recent Labs  Lab 12/12/20 1616 12/13/20 0704  AST 33 29  ALT 21 18  ALKPHOS 111 122  BILITOT 0.8 0.9  PROT 6.3* 6.0*  ALBUMIN 3.2* 3.0*   No results for input(s): LIPASE, AMYLASE in the last 168 hours. Recent Labs  Lab 12/12/20 2138  AMMONIA 29   Coagulation Profile: No results for input(s): INR, PROTIME in the last 168 hours. Cardiac Enzymes: No results for input(s): CKTOTAL, CKMB, CKMBINDEX, TROPONINI  in the last 168 hours. BNP (last 3 results) No results for input(s): PROBNP in the last 8760 hours. HbA1C: Recent Labs    12/13/20 0447  HGBA1C 9.3*   CBG: Recent Labs  Lab 12/14/20 1942 12/14/20 2159 12/14/20 2356 12/15/20 0400 12/15/20 0827  GLUCAP 124* 121* 178* 182* 185*   Lipid Profile: No results for input(s): CHOL, HDL, LDLCALC, TRIG, CHOLHDL, LDLDIRECT in the last 72 hours. Thyroid Function Tests: Recent Labs    12/12/20 2031  TSH 2.886   Anemia Panel: No results for input(s): VITAMINB12, FOLATE, FERRITIN, TIBC, IRON, RETICCTPCT in the last 72 hours. Sepsis Labs: No results for input(s): PROCALCITON, LATICACIDVEN in the last 168 hours.  Recent Results (from the past 240 hour(s))  SARS CORONAVIRUS 2 (TAT 6-24 HRS) Nasopharyngeal     Status: None   Collection Time: 12/12/20  5:45 PM   Specimen: Nasopharyngeal  Result Value Ref Range Status  SARS Coronavirus 2 NEGATIVE NEGATIVE Final    Comment: (NOTE) SARS-CoV-2 target nucleic acids are NOT DETECTED.  The SARS-CoV-2 RNA is generally detectable in upper and lower respiratory specimens during the acute phase of infection. Negative results do not preclude SARS-CoV-2 infection, do not rule out co-infections with other pathogens, and should not be used as the sole basis for treatment or other patient management decisions. Negative results must be combined with clinical observations, patient history, and epidemiological information. The expected result is Negative.  Fact Sheet for Patients: SugarRoll.be  Fact Sheet for Healthcare Providers: https://www.woods-mathews.com/  This test is not yet approved or cleared by the [place] FDA and  has been authorized for detection and/or diagnosis of SARS-CoV-2 by FDA under an Emergency Use Authorization (EUA). This EUA will remain  in effect (meaning this test can be used) for the duration of the COVID-19 declaration  under Se ction 564(b)(1) of the Act, 21 U.S.C. section 360bbb-3(b)(1), unless the authorization is terminated or revoked sooner.  Performed at [facility], [place] [address] [place], [place] [address]       Radiology Studies: US Venous Img Upper Uni Left (DVT)  Result Date: 12/14/2020 CLINICAL DATA:  Left upper extremity pain and edema. Swelling at the site of peripheral IV infusion. Evaluate for DVT. EXAM: LEFT UPPER EXTREMITY VENOUS DOPPLER ULTRASOUND TECHNIQUE: Gray-scale sonography with graded compression, as well as color Doppler and duplex ultrasound were performed to evaluate the upper extremity deep venous system from the level of the subclavian vein and including the jugular, axillary, basilic, radial, ulnar and upper cephalic vein. Spectral Doppler was utilized to evaluate flow at rest and with distal augmentation maneuvers. COMPARISON:  None. FINDINGS: Contralateral Subclavian Vein: Respiratory phasicity is normal and symmetric with the symptomatic side. No evidence of thrombus. Normal compressibility. Internal Jugular Vein: No evidence of thrombus. Normal compressibility, respiratory phasicity and response to augmentation. Subclavian Vein: No evidence of thrombus. Normal compressibility, respiratory phasicity and response to augmentation. Axillary Vein: No evidence of thrombus. Normal compressibility, respiratory phasicity and response to augmentation. Cephalic Vein: No evidence of thrombus. Normal compressibility, respiratory phasicity and response to augmentation. Basilic Vein: No evidence of thrombus. Normal compressibility, respiratory phasicity and response to augmentation. Brachial Veins: No evidence of thrombus. Normal compressibility, respiratory phasicity and response to augmentation. Radial Veins: No evidence of thrombus. Normal compressibility, respiratory phasicity and response to augmentation. Ulnar Veins: No evidence of thrombus. Normal compressibility, respiratory  phasicity and response to augmentation. Venous Reflux:  None visualized. Other Findings: There is a moderate amount of subcutaneous edema at the level of the forearm. IMPRESSION: No evidence of DVT within the left upper extremity. Electronically Signed   By: Sandi Mariscal M.D.   On: 12/14/2020 12:24   EEG adult  Result Date: 12/13/2020 Lora Havens, MD     12/13/2020  4:07 PM Patient Name: WELDA AZZARELLO MRN: [id number] Epilepsy Attending: Lora Havens Referring Physician/Provider: Dr Myles Rosenthal Date: 12/13/2020 Duration: 23.24 mins Patient history: 65yo F with ams. EEG to evaluate for seizure Level of alertness: Awake, asleep AEDs during EEG study: None Technical aspects: This EEG study was done with scalp electrodes positioned according to the 10-20 International system of electrode placement. Electrical activity was acquired at a sampling rate of 500Hz  and reviewed with a high frequency filter of 70Hz  and a low frequency filter of 1Hz . EEG data were recorded continuously and digitally stored. Description: The posterior dominant rhythm consists of 7-8 Hz activity of moderate  voltage (25-35 uV) seen predominantly in posterior head regions, symmetric and reactive to eye opening and eye closing. Sleep was characterized by vertex waves, sleep spindles (12 to 14 Hz), maximal frontocentral region. EEG also showed continuous generalized 5-6 Hz theta slowing. Physiologic photic driving was not seen during photic stimulation.  Hyperventilation was not performed.   ABNORMALITY - Continuous slow, generalized IMPRESSION: This study is suggestive of mild diffuse encephalopathy, nonspecific etiology. No seizures or epileptiform discharges were seen throughout the recording. Priyanka Barbra Sarks      Scheduled Meds: . heparin  5,000 Units Subcutaneous Q8H  . insulin aspart  0-5 Units Subcutaneous QHS  . insulin aspart  0-9 Units Subcutaneous TID WC  . insulin glargine  7 Units Subcutaneous Daily  . lidocaine  1  patch Transdermal Q24H  . metoprolol tartrate  12.5 mg Oral BID  . midodrine  5 mg Oral TID WC  . polyethylene glycol  17 g Oral Daily   Continuous Infusions:   LOS: 3 days      Time spent: 20 minutes   Dessa Phi, DO [facility] 12/15/2020, 10:32 AM   Available via Epic secure chat 7am-7pm After these hours, please refer to coverage provider listed on [email or web address]

## 2020-12-15 NOTE — TOC Progression Note (Signed)
Transition of Care [facility]) - Progression Note    Patient Details  Name: [name] MRN: [id number] Date of Birth: May 23, 1956  Transition of Care [facility]) CM/SW Contact  Shelbie Hutching, RN Phone Number: 12/15/2020, 12:45 PM  Clinical Narrative:    Patient does not want to go to SNF but prefers to go home.  Patient worked with PT today and is comfortable going home with home health services.  Patient's husband is also good with patient going home at discharge.  Patient has used [facility] in the past and would like to use them again for RN, PT, OT, and aide.  [place] with [facility] accepted referral.  Patient reports that she has transportation home tomorrow at noon.    Expected Discharge Plan: [place] Barriers to Discharge: Continued Medical Work up  Expected Discharge Plan and Services Expected Discharge Plan: [place]   Discharge Planning Services: CM Consult Post Acute Care Choice: [place] arrangements for the past 2 months: Mobile Home                 DME Arranged: N/A DME Agency: NA       HH Arranged: RN,PT,OT,Nurse's Aide [place]: [facility] Date [place]: 12/15/20 Time [place]: 2952 Representative spoke with at [place]: [place] ([place]) Interventions    Readmission Risk Interventions Readmission Risk Prevention Plan 10/01/2019 07/18/2019 07/16/2019  Transportation Screening Complete Complete Complete  Medication Review Press photographer) Complete Complete Complete  PCP or Specialist appointment within 3-5 days of discharge Complete Complete -  [place] or Home Care Consult Complete Complete Complete  SW Recovery Care/Counseling Consult Complete Complete -  Palliative Care Screening Not Applicable Not Applicable Not [place] - Not Applicable Patient Refused  Some recent data might be hidden

## 2020-12-15 NOTE — Care Management Important Message (Signed)
Important Message  Patient Details  Name: [name] MRN: [id number] Date of Birth: Feb 04, 1956   Medicare Important Message Given:  Yes     Juliann Pulse A Shaney Deckman 12/15/2020, 10:38 AM

## 2020-12-16 DIAGNOSIS — E162 Hypoglycemia, unspecified: Secondary | ICD-10-CM | POA: Diagnosis not present

## 2020-12-16 LAB — GLUCOSE, CAPILLARY
Glucose-Capillary: 166 mg/dL — ABNORMAL HIGH (ref 70–99)
Glucose-Capillary: 162 mg/dL — ABNORMAL HIGH (ref 70–99)
Glucose-Capillary: 241 mg/dL — ABNORMAL HIGH (ref 70–99)
Glucose-Capillary: 153 mg/dL — ABNORMAL HIGH (ref 70–99)

## 2020-12-16 MED ORDER — HYDRALAZINE HCL 10 MG PO TABS
10.0000 mg | ORAL_TABLET | Freq: Once | ORAL | Status: AC
  Administered 2020-12-16: 01:00:00 10 mg via ORAL
  Filled 2020-12-16: qty 1

## 2020-12-16 NOTE — Progress Notes (Signed)
PROGRESS NOTE    [name]  PYK:[id number] DOB: August 21, 1956 DOA: 12/12/2020 PCP: [name], NP     Brief Narrative:  [name] [name] a 65 y.o.femalewith medical history significant ofhistory of hypertension, hyperlipidemia, atrial fibrillationnot on anticoagulation due to frequent fall,diabetes, cirrhosis, and chronic orthostatic hypotension on midodrine, history of mild covid infection s/p recovery,who presents to ED by EMS due to excessive lethargy/change in mental status. She was found to be severely hypoglycemic and was started on D10 and transported to ED. Patient states she has no recollection of what occurred prior to presenting to ED. She does note chronic back pain, but is currently on drug holiday and has not taken oxycodone in couple of weeks. Patient was admitted to acute metabolic encephalopathy, thought to be secondary to hypoglycemia and polypharmacy.   New events last 24 hours / Subjective: Patient reports feeling well, she has no physical complaints. After my rounds, her orthostatic VS was checked 145/63 lying --> 114/64 standing without symptoms.   Assessment & Plan:   Principal Problem:   Acute encephalopathy Active Problems:   Chronic pain syndrome   Atrial fibrillation (HCC)   Hypotension   Frequent falls   Chronic musculoskeletal pain   GAD (generalized anxiety disorder)   Cirrhosis of liver not due to alcohol (HCC)   Hypoglycemia   CKD (chronic kidney disease), stage III (HCC)   Acute metabolic encephalopathy -Secondary to hypoglycemia as well as polypharmacy  -Neuro consulted, believed her overall presentation most consistent with medication toxicity versus soporific effects of illicit substance use, +/- neurotoxic effects of possible severe hypoglycemic episode at home -EEG: This study is suggestive of mild diffuse encephalopathy, nonspecific etiology. No seizures or epileptiform discharges were seen throughout the recording -Ammonia 29   -Resolved, back to baseline  DM type 2, insulin-dependent, uncontrolled with hyperglycemia as well as episodes of hypoglycemia -Ha1c 9.3 -Resolved -Lantus, SSI  -Discussed reducing long-acting insulin dosing at time of discharge to avoid hypoglycemic episodes  Paroxysmal A Fib -Not on anticoagulation due to frequent falls -Hold metoprolol due to orthostasis   Chronic hypotension -Continue midodrine -Hold metoprolol due to orthostasis   CKD stage 3a -Stable   Left upper extremity swelling -Secondary to IV infiltration -Palpable pulses present -Venous duplex negative for DVT -Improving     DVT prophylaxis:  enoxaparin (LOVENOX) injection 40 mg Start: 12/15/20 1400  Code Status: Full Family Communication: None at bedside Disposition Plan:  Status is: Inpatient  Remains inpatient appropriate because:Inpatient level of care appropriate due to severity of illness   Dispo: The patient is from: Home              Anticipated d/c is to: Home              Patient currently is not medically stable to d/c.  Patient declined SNF. Plan for discharge with home health once orthostatic VS improved    Difficult to place patient No    Antimicrobials:  Anti-infectives (From admission, onward)   Start     Dose/Rate Route Frequency Ordered Stop   12/12/20 1745  Ampicillin-Sulbactam (UNASYN) 3 g in sodium chloride 0.9 % 100 mL IVPB        3 g 200 mL/hr over 30 Minutes Intravenous  Once 12/12/20 1738 12/12/20 1850       Objective: Vitals:   12/15/20 1949 12/15/20 2335 12/16/20 0417 12/16/20 0724  BP: (!) 187/78 (!) 175/69 (!) 161/77 (!) 159/68  Pulse: 85 71 80 82  Resp:  18 17 17 20   Temp: 98.8 F (37.1 C) 98.7 F (37.1 C) 98.2 F (36.8 C) 99.2 F (37.3 C)  TempSrc: Oral Oral Oral Oral  SpO2: 96% 99% 98% 98%  Weight:      Height:        Intake/Output Summary (Last 24 hours) at 12/16/2020 1122 Last data filed at 12/16/2020 1016 Gross per 24 hour  Intake 240 ml  Output  300 ml  Net -60 ml   Filed Weights   12/12/20 1608  Weight: 75.8 kg    Examination: General exam: Appears calm and comfortable  Respiratory system: Clear to auscultation. Respiratory effort normal. Cardiovascular system: S1 & S2 heard, RRR. No pedal edema. Gastrointestinal system: Abdomen is nondistended, soft and nontender. Normal bowel sounds heard. Central nervous system: Alert and oriented. Non focal exam. Speech clear  Extremities: LUE swelling improved  Psychiatry: Judgement and insight appear stable. Mood & affect appropriate.    Data Reviewed: I have personally reviewed following labs and imaging studies  CBC: Recent Labs  Lab 12/12/20 1616 12/12/20 2031 12/13/20 0447 12/15/20 0425  WBC 7.2 6.9 5.0 5.2  NEUTROABS 6.3  --   --   --   HGB 10.8* 9.5* 9.7* 10.1*  HCT 35.1* 30.3* 31.0* 32.1*  MCV 85.8 85.4 85.6 84.7  PLT 128* 133* 110* 384   Basic Metabolic Panel: Recent Labs  Lab 12/12/20 1616 12/12/20 2031 12/13/20 0704 12/15/20 0425  NA 138  --  141 139  K 3.3*  --  4.4 3.8  CL 113*  --  116* 110  CO2 19*  --  18* 22  GLUCOSE 234*  --  177* 196*  BUN 20  --  19 18  CREATININE 1.02* 1.07* 1.05* 1.18*  CALCIUM 7.5*  --  8.2* 8.7*   GFR: Estimated Creatinine Clearance: 45.8 mL/min (A) (by C-G formula based on SCr of 1.18 mg/dL (H)). Liver Function Tests: Recent Labs  Lab 12/12/20 1616 12/13/20 0704  AST 33 29  ALT 21 18  ALKPHOS 111 122  BILITOT 0.8 0.9  PROT 6.3* 6.0*  ALBUMIN 3.2* 3.0*   No results for input(s): LIPASE, AMYLASE in the last 168 hours. Recent Labs  Lab 12/12/20 2138  AMMONIA 29   Coagulation Profile: No results for input(s): INR, PROTIME in the last 168 hours. Cardiac Enzymes: No results for input(s): CKTOTAL, CKMB, CKMBINDEX, TROPONINI in the last 168 hours. BNP (last 3 results) No results for input(s): PROBNP in the last 8760 hours. HbA1C: No results for input(s): HGBA1C in the last 72 hours. CBG: Recent Labs  Lab  12/15/20 0827 12/15/20 1208 12/15/20 1727 12/15/20 1952 12/16/20 0819  GLUCAP 185* 135* 152* 139* 166*   Lipid Profile: No results for input(s): CHOL, HDL, LDLCALC, TRIG, CHOLHDL, LDLDIRECT in the last 72 hours. Thyroid Function Tests: No results for input(s): TSH, T4TOTAL, FREET4, T3FREE, THYROIDAB in the last 72 hours. Anemia Panel: No results for input(s): VITAMINB12, FOLATE, FERRITIN, TIBC, IRON, RETICCTPCT in the last 72 hours. Sepsis Labs: No results for input(s): PROCALCITON, LATICACIDVEN in the last 168 hours.  Recent Results (from the past 240 hour(s))  SARS CORONAVIRUS 2 (TAT 6-24 HRS) Nasopharyngeal     Status: None   Collection Time: 12/12/20  5:45 PM   Specimen: Nasopharyngeal  Result Value Ref Range Status   SARS Coronavirus 2 NEGATIVE NEGATIVE Final    Comment: (NOTE) SARS-CoV-2 target nucleic acids are NOT DETECTED.  The SARS-CoV-2 RNA is generally detectable in upper and lower  respiratory specimens during the acute phase of infection. Negative results do not preclude SARS-CoV-2 infection, do not rule out co-infections with other pathogens, and should not be used as the sole basis for treatment or other patient management decisions. Negative results must be combined with clinical observations, patient history, and epidemiological information. The expected result is Negative.  Fact Sheet for Patients: SugarRoll.be  Fact Sheet for Healthcare Providers: https://www.woods-mathews.com/  This test is not yet approved or cleared by the [place] FDA and  has been authorized for detection and/or diagnosis of SARS-CoV-2 by FDA under an Emergency Use Authorization (EUA). This EUA will remain  in effect (meaning this test can be used) for the duration of the COVID-19 declaration under Se ction 564(b)(1) of the Act, 21 U.S.C. section 360bbb-3(b)(1), unless the authorization is terminated or revoked sooner.  Performed at  [facility], [place] [address] [place] [address]       Radiology Studies: US Venous Img Upper Uni Left (DVT)  Result Date: 12/14/2020 CLINICAL DATA:  Left upper extremity pain and edema. Swelling at the site of peripheral IV infusion. Evaluate for DVT. EXAM: LEFT UPPER EXTREMITY VENOUS DOPPLER ULTRASOUND TECHNIQUE: Gray-scale sonography with graded compression, as well as color Doppler and duplex ultrasound were performed to evaluate the upper extremity deep venous system from the level of the subclavian vein and including the jugular, axillary, basilic, radial, ulnar and upper cephalic vein. Spectral Doppler was utilized to evaluate flow at rest and with distal augmentation maneuvers. COMPARISON:  None. FINDINGS: Contralateral Subclavian Vein: Respiratory phasicity is normal and symmetric with the symptomatic side. No evidence of thrombus. Normal compressibility. Internal Jugular Vein: No evidence of thrombus. Normal compressibility, respiratory phasicity and response to augmentation. Subclavian Vein: No evidence of thrombus. Normal compressibility, respiratory phasicity and response to augmentation. Axillary Vein: No evidence of thrombus. Normal compressibility, respiratory phasicity and response to augmentation. Cephalic Vein: No evidence of thrombus. Normal compressibility, respiratory phasicity and response to augmentation. Basilic Vein: No evidence of thrombus. Normal compressibility, respiratory phasicity and response to augmentation. Brachial Veins: No evidence of thrombus. Normal compressibility, respiratory phasicity and response to augmentation. Radial Veins: No evidence of thrombus. Normal compressibility, respiratory phasicity and response to augmentation. Ulnar Veins: No evidence of thrombus. Normal compressibility, respiratory phasicity and response to augmentation. Venous Reflux:  None visualized. Other Findings: There is a moderate amount of subcutaneous edema at the level of  the forearm. IMPRESSION: No evidence of DVT within the left upper extremity. Electronically Signed   By: Sandi Mariscal M.D.   On: 12/14/2020 12:24      Scheduled Meds: . enoxaparin (LOVENOX) injection  40 mg Subcutaneous Q24H  . insulin aspart  0-5 Units Subcutaneous QHS  . insulin aspart  0-9 Units Subcutaneous TID WC  . insulin glargine  7 Units Subcutaneous Daily  . lidocaine  1 patch Transdermal Q24H  . midodrine  5 mg Oral TID WC  . polyethylene glycol  17 g Oral Daily  . sertraline  50 mg Oral Daily   Continuous Infusions:   LOS: 4 days      Time spent: 20 minutes   Dessa Phi, DO [facility] 12/16/2020, 11:22 AM   Available via Epic secure chat 7am-7pm After these hours, please refer to coverage provider listed on [email or web address]

## 2020-12-16 NOTE — Progress Notes (Addendum)
   12/15/20 2335 12/16/20 0417  Assess: MEWS Score  BP (!) 175/69 (!) 161/77  B. Randol Kern NP was contacted by previous RN Stanton Kidney regarding elevated BP. NP ordered labetalol IV. NP msg regarding loss of IV access during previous shift and order to leave IV out due to upcoming d/c. NP changed to hydrazaline po. Hydrazaline given at  0052 and was effective. BP decreased to 161/77.

## 2020-12-16 NOTE — TOC Progression Note (Signed)
Transition of Care [facility]) - Progression Note    Patient Details  Name: RASHMI TALLENT MRN: [id number] Date of Birth: [birth year]  [facility] of [facility] [place] CM/SW Contact  Shelbie Hutching, RN Phone Number: 12/16/2020, 4:25 PM  Clinical Narrative:    Patient is still orthostatic today and MD is not comfortable discharging her today.  Plan for discharge tomorrow home with home health.  [place] with [facility] aware of potential discharge for tomorrow.    Expected Discharge Plan: [place] Barriers to Discharge: Continued Medical Work up  Expected Discharge Plan and Services Expected Discharge Plan: [place]   Discharge Planning Services: CM Consult Post Acute Care Choice: [place] arrangements for the past 2 months: Mobile Home                 DME Arranged: N/A DME Agency: NA       HH Arranged: RN,PT,OT,Nurse's Aide Scraper: [facility] Date [place]: 12/15/20 Time [place]: [phone] Representative spoke with at [place]: [place] ([place]) Interventions    Readmission Risk Interventions Readmission Risk Prevention Plan 10/01/2019 07/18/2019 07/16/2019  Transportation Screening Complete Complete Complete  Medication Review Press photographer) Complete Complete Complete  PCP or Specialist appointment within 3-5 days of discharge Complete Complete -  [place] or Home Care Consult Complete Complete Complete  SW Recovery Care/Counseling Consult Complete Complete -  Palliative Care Screening Not Applicable Not Applicable Not [place] - Not Applicable Patient Refused  Some recent data might be hidden

## 2020-12-17 DIAGNOSIS — E162 Hypoglycemia, unspecified: Secondary | ICD-10-CM | POA: Diagnosis not present

## 2020-12-17 LAB — GLUCOSE, CAPILLARY: Glucose-Capillary: 193 mg/dL — ABNORMAL HIGH (ref 70–99)

## 2020-12-17 MED ORDER — LEVEMIR FLEXTOUCH 100 UNIT/ML ~~LOC~~ SOPN: 7.0000 [IU] | PEN_INJECTOR | Freq: Every day | SUBCUTANEOUS | 0 refills | Status: DC

## 2020-12-17 NOTE — Discharge Instructions (Signed)
Continuous Peripheral Nerve Block Infusion Self-Care A continuous peripheral nerve block infusion provides pain relief after certain kinds of surgery, such as shoulder or knee surgery. A pump delivers numbing medicine near a nerve, which lessens the amount of pain you feel and allows you to leave the hospital sooner. How to protect the numb area Until feeling returns to the numbed area of your body, take these actions to protect the area:  Carefully pad the numb area. This will help prevent injury and the development of sores.  Be careful when using items that are hot or cold. You will not be able to feel temperature in the numb area, so your skin in that area can get damaged more easily.  If your arm is numb, wear a sling as told by your health care provider.  If your leg is numb, wear a brace as told by your health care provider. If you were given crutches, use them as told.  Try to keep the numb area raised (elevated) whenever possible. This will help lessen the amount of swelling and make you more comfortable. How to care for the catheter Take these actions to care for the small, thin tube (catheter) you were given for as long as it is in place:  Do not take baths, swim, or use a hot tub until your health care provider approves. Ask your health care provider if you may take showers. You may only be allowed to take sponge baths.  Make sure to keep the catheter site, pump, and tubing dry.  Try to avoid kinking or pinching of the catheter.  Avoid pulling or tugging on the catheter. Follow instructions from your health care provider about when and how to remove the catheter. For most catheters, you will need to take the following steps: 1. Wash your hands with soap and warm water. 2. Remove the bandage (dressing) along with any tape or additional dressings that are used to secure the catheter. 3. Firmly grasp the catheter where it enters the skin. 4. Gently pull the catheter. The catheter  should come out easily. If it does not, or if you have pain while you pull it, stop and call your health care provider. Do not cut the catheter. 5. Look at the catheter to make sure the entire catheter was removed. Most catheters have a mark on the end. If you do not see a mark, or if the tubing is broken, contact your health care provider right away. 6. Cover the site with an adhesive dressing. 7. Throw away the pump and tubing. 8. Wash your hands again with soap and warm water. When you remove the catheter, it is normal to see a small amount of fluid in the place where the catheter was.      Follow these instructions at home:  Take over-the-counter and prescription medicines only as told by your health care provider.  Continue to cover the catheter site with a dressing until the site has healed.  Do not drive or use machinery until your health care provider approves.  Keep all follow-up visits as told by your health care provider. This is important. Contact a health care provider if:  You have redness, pain, or a yellow drainage on the catheter site.  You have a large bruise or swelling on the catheter site.  You have a fever. Get help right away if:  You have a rash or hives.  You have uncontrolled pain in the area that should be numbed.  You  have symptoms of a reaction to the nerve block. Symptoms of a reaction include: ? Numb lips. ? Changes to vision or hearing, such as blurred vision or ringing in your ears. ? A metallic taste in your mouth. ? Increased anxiety. ? Dizziness. ? Shaking (such as tremors), twitching, or seizures. ? Changes in bowel or bladder control.  You have trouble breathing. If you have any of these symptoms, clamp the catheter to stop the medicine from going into your body. These symptoms may represent a serious problem that is an emergency. Do not wait to see if the symptoms will go away. Get medical help right away. Call your local emergency  services (911 in the [place] Do not drive yourself to the hospital. Summary  A continuous peripheral nerve block infusion provides pain relief after certain kinds of surgery, such as shoulder or knee surgery.  A pump delivers numbing medicine near a nerve, which lessens the amount of pain you feel and allows you to leave the hospital sooner.  Avoid pulling or tugging on the catheter.  Get help right away if you have symptoms of a reaction to the nerve block. This information is not intended to replace advice given to you by your health care provider. Make sure you discuss any questions you have with your health care provider. Document Revised: 12/29/2019 Document Reviewed: 07/13/2019 Elsevier Patient Education  2021 [employer].

## 2020-12-17 NOTE — Discharge Summary (Signed)
Physician Discharge Summary  [name] QAS:[id number] DOB: December 18, 1955 DOA: 12/12/2020  PCP: [name], NP  Admit date: 12/12/2020 Discharge date: 12/17/2020  Admitted From: Home Disposition:  Home, declined SNF   Recommendations for Outpatient Follow-up:  1. Follow up with PCP in 1 week  Discharge Condition: Stable CODE STATUS: Full  Diet recommendation: Carb modified   Brief/Interim Summary: Rita Milan Pooleis a 65 y.o.femalewith medical history significant ofhistory of hypertension, hyperlipidemia, atrial fibrillationnot on anticoagulation due to frequent fall,diabetes, cirrhosis, and chronic orthostatic hypotension on midodrine, history of mild covid infection s/p recovery,who presents to ED by EMS due to excessive lethargy/change in mental status. She was found to be severely hypoglycemic and was started on D10 and transported to ED. Patient states she has no recollection of what occurred prior to presenting to ED. She does note chronic back pain, but is currently on drug holiday and has not taken oxycodone in couple of weeks. Patient was admitted to acute metabolic encephalopathy, thought to be secondary to hypoglycemia and polypharmacy. Patient's mentation continued to improve back to baseline. She was discharged back home in improved and stable condition. She declined SNF placement.   Discharge Diagnoses:  Principal Problem:   Acute encephalopathy Active Problems:   Chronic pain syndrome   Atrial fibrillation (HCC)   Hypotension   Frequent falls   Chronic musculoskeletal pain   GAD (generalized anxiety disorder)   Cirrhosis of liver not due to alcohol (HCC)   Hypoglycemia   CKD (chronic kidney disease), stage III (HCC)   Acute metabolic encephalopathy -Secondary to hypoglycemia as well as polypharmacy  -Neuro consulted, believed her overall presentation most consistent with medication toxicity versus soporific effects of illicit substance use, +/- neurotoxic  effects of possible severe hypoglycemic episode at home -EEG: This study is suggestive of milddiffuse encephalopathy, nonspecific etiology.No seizures or epileptiform discharges were seen throughout the recording -Ammonia 29  -Resolved, back to baseline  DM type 2, insulin-dependent, uncontrolled with hyperglycemia as well as episodes of hypoglycemia -Ha1c 9.3 -Resolved -Lantus, SSI  -Discussed reducing long-acting insulin dosing at time of discharge to avoid hypoglycemic episodes   Paroxysmal A Fib -Not on anticoagulation due to frequent falls  Chronic hypotension -Continue midodrine -Orthostatic VS improved this morning   CKD stage 3a -Stable   Left upper extremity swelling -Secondary to IV infiltration -Palpable pulses present -Venous duplex negative for DVT -Improving   Discharge Instructions  Discharge Instructions    Call MD for:  difficulty breathing, headache or visual disturbances   Complete by: As directed    Call MD for:  extreme fatigue   Complete by: As directed    Call MD for:  persistant dizziness or light-headedness   Complete by: As directed    Call MD for:  persistant nausea and vomiting   Complete by: As directed    Call MD for:  severe uncontrolled pain   Complete by: As directed    Call MD for:  temperature >100.4   Complete by: As directed    Change dressing (specify)   Complete by: As directed    Dressing change: Change once daily, keep left arm blistered area clean and dry daily   Discharge instructions   Complete by: As directed    You were cared for by a hospitalist during your hospital stay. If you have any questions about your discharge medications or the care you received while you were in the hospital after you are discharged, you can call the unit and  ask to speak with the hospitalist on call if the hospitalist that took care of you is not available. Once you are discharged, your primary care physician will handle any further  medical issues. Please note that NO REFILLS for any discharge medications will be authorized once you are discharged, as it is imperative that you return to your primary care physician (or establish a relationship with a primary care physician if you do not have one) for your aftercare needs so that they can reassess your need for medications and monitor your lab values.   Increase activity slowly   Complete by: As directed      Allergies as of 12/17/2020      Reactions   Latex Other (See Comments)   Pt had allergic reaction to tegaderm dressing on IV.  Caused blisters on the skin.   Aspirin Rash   Codeine Sulfate Rash   Erythromycin Rash   Reaction:  Fever    Prednisone Swelling   Rosiglitazone Maleate Swelling   Tetanus-diphtheria Toxoids Td Rash, Other (See Comments)   Reaction:  Fever       Medication List    STOP taking these medications   cyclobenzaprine 10 MG tablet Commonly known as: FLEXERIL   oxyCODONE 5 MG immediate release tablet Commonly known as: Oxy IR/ROXICODONE     TAKE these medications   Levemir FlexTouch 100 UNIT/ML FlexPen Generic drug: insulin detemir Inject 7 Units into the skin daily. What changed:   how much to take  when to take this   lidocaine 5 % Commonly known as: Lidoderm Place 1 patch onto the skin every 12 (twelve) hours. Remove & Discard patch within 12 hours or as directed by MD   Melatonin 10 MG Caps Take 1 tablet by mouth at bedtime.   midodrine 5 MG tablet Commonly known as: PROAMATINE Take 1 tablet (5 mg total) by mouth 3 (three) times daily with meals. To help prevent blood pressure dropping when you stand.   sertraline 50 MG tablet Commonly known as: ZOLOFT Take 1 tablet (50 mg total) by mouth daily. For anxiety and depression.            Discharge Care Instructions  (From admission, onward)         Start     Ordered   12/17/20 0000  Change dressing (specify)       Comments: Dressing change: Change once daily,  keep left arm blistered area clean and dry daily   12/17/20 0865          Follow-up Information    [name], NP. Schedule an appointment as soon as possible for a visit in 1 week(s).   Specialty: Internal Medicine Contact information: [place] [address] [phone]              Allergies  Allergen Reactions  . Latex Other (See Comments)    Pt had allergic reaction to tegaderm dressing on IV.  Caused blisters on the skin.  . Aspirin Rash  . Codeine Sulfate Rash  . Erythromycin Rash    Reaction:  Fever   . Prednisone Swelling  . Rosiglitazone Maleate Swelling  . Tetanus-Diphtheria Toxoids Td Rash and Other (See Comments)    Reaction:  Fever     Consultations:  Neurology    Procedures/Studies: CT Head Wo Contrast  Result Date: 12/12/2020 CLINICAL DATA:  65 year old female with history of syncope and confusion on Eliquis. Evaluate for intracranial hemorrhage. EXAM: CT HEAD  WITHOUT CONTRAST TECHNIQUE: Contiguous axial images were obtained from the base of the skull through the vertex without intravenous contrast. COMPARISON:  Head CT 11/09/2020. FINDINGS: Brain: Mild cerebral atrophy. Patchy and confluent areas of decreased attenuation are noted throughout the deep and periventricular white matter of the cerebral hemispheres bilaterally, compatible with chronic microvascular ischemic disease. No evidence of acute infarction, hemorrhage, hydrocephalus, extra-axial collection or mass lesion/mass effect. Vascular: No hyperdense vessel or unexpected calcification. Skull: Normal. Negative for fracture or focal lesion. Sinuses/Orbits: Paranasal sinuses are clear. Right mastoid is well pneumatized. Large left mastoid effusion. Other: None. IMPRESSION: 1. No acute intracranial abnormality. Specifically, no evidence of acute intracranial hemorrhage. 2. Large left mastoid effusion. Clinical correlation for signs and symptoms of mastoiditis is recommended. 3.  Mild cerebral atrophy with chronic microvascular ischemic changes in the cerebral white matter, as above. Electronically Signed   By: Vinnie Langton M.D.   On: 12/12/2020 16:45   MR BRAIN WO CONTRAST  Result Date: 12/12/2020 CLINICAL DATA:  Initial evaluation for memory loss, mental status change. EXAM: MRI HEAD WITHOUT CONTRAST TECHNIQUE: Multiplanar, multiecho pulse sequences of the brain and surrounding structures were obtained without intravenous contrast. COMPARISON:  Prior CT from earlier the same day as well as previous MRI from 09/27/2020. FINDINGS: Brain: Mild age-related cerebral volume loss, stable. Patchy T2/FLAIR hyperintensity within the periventricular and deep white matter both cerebral hemispheres as well as the pons, most consistent with chronic small vessel ischemic disease, also stable. Superimposed probable small remote lacunar infarct at the central pons again noted. Small remote right cerebellar and left occipital cortical infarcts again noted as well, also stable. No abnormal foci of restricted diffusion to suggest acute or subacute ischemia. Gray-white matter differentiation otherwise maintained. No other areas of encephalomalacia to suggest chronic cortical infarction. No acute or chronic intracranial hemorrhage. No mass lesion, mass effect, or midline shift. No hydrocephalus or extra-axial fluid collection. Pituitary gland and suprasellar region normal. Vascular: Mildly increased FLAIR hyperintensity within the left transverse sinus without T1 correlate, likely reflecting slow flow. Major intracranial vascular flow voids are otherwise maintained. Skull and upper cervical spine: Craniocervical junction within normal limits. Bone marrow signal intensity normal. No scalp soft tissue abnormality. Sinuses/Orbits: Globes and orbital soft tissues within normal limits. Mild scattered mucosal thickening noted within the ethmoidal air cells. Paranasal sinuses are otherwise clear. Chronic left  mastoid and middle ear effusion, stable from previous. Visualized nasopharynx within normal limits. Other: None. IMPRESSION: 1. No acute intracranial abnormality. 2. Age-related cerebral atrophy with mild-to-moderate chronic microvascular ischemic disease, with a few scattered small remote ischemic infarcts as above, stable. 3. Chronic left mastoid and middle ear effusion, stable from previous. Electronically Signed   By: Jeannine Boga M.D.   On: 12/12/2020 19:49   US Venous Img Upper Uni Left (DVT)  Result Date: 12/14/2020 CLINICAL DATA:  Left upper extremity pain and edema. Swelling at the site of peripheral IV infusion. Evaluate for DVT. EXAM: LEFT UPPER EXTREMITY VENOUS DOPPLER ULTRASOUND TECHNIQUE: Gray-scale sonography with graded compression, as well as color Doppler and duplex ultrasound were performed to evaluate the upper extremity deep venous system from the level of the subclavian vein and including the jugular, axillary, basilic, radial, ulnar and upper cephalic vein. Spectral Doppler was utilized to evaluate flow at rest and with distal augmentation maneuvers. COMPARISON:  None. FINDINGS: Contralateral Subclavian Vein: Respiratory phasicity is normal and symmetric with the symptomatic side. No evidence of thrombus. Normal compressibility. Internal Jugular Vein: No evidence of thrombus.  Normal compressibility, respiratory phasicity and response to augmentation. Subclavian Vein: No evidence of thrombus. Normal compressibility, respiratory phasicity and response to augmentation. Axillary Vein: No evidence of thrombus. Normal compressibility, respiratory phasicity and response to augmentation. Cephalic Vein: No evidence of thrombus. Normal compressibility, respiratory phasicity and response to augmentation. Basilic Vein: No evidence of thrombus. Normal compressibility, respiratory phasicity and response to augmentation. Brachial Veins: No evidence of thrombus. Normal compressibility, respiratory  phasicity and response to augmentation. Radial Veins: No evidence of thrombus. Normal compressibility, respiratory phasicity and response to augmentation. Ulnar Veins: No evidence of thrombus. Normal compressibility, respiratory phasicity and response to augmentation. Venous Reflux:  None visualized. Other Findings: There is a moderate amount of subcutaneous edema at the level of the forearm. IMPRESSION: No evidence of DVT within the left upper extremity. Electronically Signed   By: Sandi Mariscal M.D.   On: 12/14/2020 12:24   DG Chest Portable 1 View  Result Date: 12/12/2020 CLINICAL DATA:  Hyperglycemia.  Altered mental status. EXAM: PORTABLE CHEST 1 VIEW COMPARISON:  11/01/2020 FINDINGS: Shallow inspiration. Heart size and pulmonary vascularity are probably normal for inspiratory effort. No airspace disease or consolidation in the lungs. Vascular crowding. No pleural effusions. No pneumothorax. Mediastinal contours appear intact. IMPRESSION: Shallow inspiration.  No evidence of active pulmonary disease. Electronically Signed   By: Lucienne Capers M.D.   On: 12/12/2020 19:59   EEG adult  Result Date: 12/13/2020 Lora Havens, MD     12/13/2020  4:07 PM Patient Name: JAMERICA SNAVELY MRN: [id number] Epilepsy Attending: Lora Havens Referring Physician/Provider: Dr Myles Rosenthal Date: 12/13/2020 Duration: 23.24 mins Patient history: 65yo F with ams. EEG to evaluate for seizure Level of alertness: Awake, asleep AEDs during EEG study: None Technical aspects: This EEG study was done with scalp electrodes positioned according to the 10-20 International system of electrode placement. Electrical activity was acquired at a sampling rate of 500Hz  and reviewed with a high frequency filter of 70Hz  and a low frequency filter of 1Hz . EEG data were recorded continuously and digitally stored. Description: The posterior dominant rhythm consists of 7-8 Hz activity of moderate voltage (25-35 uV) seen predominantly in  posterior head regions, symmetric and reactive to eye opening and eye closing. Sleep was characterized by vertex waves, sleep spindles (12 to 14 Hz), maximal frontocentral region. EEG also showed continuous generalized 5-6 Hz theta slowing. Physiologic photic driving was not seen during photic stimulation.  Hyperventilation was not performed.   ABNORMALITY - Continuous slow, generalized IMPRESSION: This study is suggestive of mild diffuse encephalopathy, nonspecific etiology. No seizures or epileptiform discharges were seen throughout the recording. Lora Havens       Discharge Exam: Vitals:   12/17/20 0408 12/17/20 0844  BP: (!) 156/73 139/76  Pulse: 90 (!) 101  Resp: 16 18  Temp: 98.4 F (36.9 C) (!) 97 F (36.1 C)  SpO2: 97% 97%   General: Pt is alert, awake, not in acute distress Cardiovascular: RRR, S1/S2 +, no edema Respiratory: CTA bilaterally, no wheezing, no rhonchi, no respiratory distress, no conversational dyspnea  Abdominal: Soft, NT, ND, bowel sounds + Extremities: no edema, no cyanosis Psych: Normal mood and affect, stable judgement and insight     The results of significant diagnostics from this hospitalization (including imaging, microbiology, ancillary and laboratory) are listed below for reference.     Microbiology: Recent Results (from the past 240 hour(s))  SARS CORONAVIRUS 2 (TAT 6-24 HRS) Nasopharyngeal     Status: None  Collection Time: 12/12/20  5:45 PM   Specimen: Nasopharyngeal  Result Value Ref Range Status   SARS Coronavirus 2 NEGATIVE NEGATIVE Final    Comment: (NOTE) SARS-CoV-2 target nucleic acids are NOT DETECTED.  The SARS-CoV-2 RNA is generally detectable in upper and lower respiratory specimens during the acute phase of infection. Negative results do not preclude SARS-CoV-2 infection, do not rule out co-infections with other pathogens, and should not be used as the sole basis for treatment or other patient management  decisions. Negative results must be combined with clinical observations, patient history, and epidemiological information. The expected result is Negative.  Fact Sheet for Patients: SugarRoll.be  Fact Sheet for Healthcare Providers: https://www.woods-mathews.com/  This test is not yet approved or cleared by the [place] FDA and  has been authorized for detection and/or diagnosis of SARS-CoV-2 by FDA under an Emergency Use Authorization (EUA). This EUA will remain  in effect (meaning this test can be used) for the duration of the COVID-19 declaration under Se ction 564(b)(1) of the Act, 21 U.S.C. section 360bbb-3(b)(1), unless the authorization is terminated or revoked sooner.  Performed at [facility], [place] [address], [place], [place] [address]      Labs: BNP (last 3 results) Recent Labs    07/17/20 0610 08/27/20 1216  BNP 132.0* 629.4*   Basic Metabolic Panel: Recent Labs  Lab 12/12/20 1616 12/12/20 2031 12/13/20 0704 12/15/20 0425  NA 138  --  141 139  K 3.3*  --  4.4 3.8  CL 113*  --  116* 110  CO2 19*  --  18* 22  GLUCOSE 234*  --  177* 196*  BUN 20  --  19 18  CREATININE 1.02* 1.07* 1.05* 1.18*  CALCIUM 7.5*  --  8.2* 8.7*   Liver Function Tests: Recent Labs  Lab 12/12/20 1616 12/13/20 0704  AST 33 29  ALT 21 18  ALKPHOS 111 122  BILITOT 0.8 0.9  PROT 6.3* 6.0*  ALBUMIN 3.2* 3.0*   No results for input(s): LIPASE, AMYLASE in the last 168 hours. Recent Labs  Lab 12/12/20 2138  AMMONIA 29   CBC: Recent Labs  Lab 12/12/20 1616 12/12/20 2031 12/13/20 0447 12/15/20 0425  WBC 7.2 6.9 5.0 5.2  NEUTROABS 6.3  --   --   --   HGB 10.8* 9.5* 9.7* 10.1*  HCT 35.1* 30.3* 31.0* 32.1*  MCV 85.8 85.4 85.6 84.7  PLT 128* 133* 110* 151   Cardiac Enzymes: No results for input(s): CKTOTAL, CKMB, CKMBINDEX, TROPONINI in the last 168 hours. BNP: Invalid input(s): POCBNP CBG: Recent Labs  Lab  12/16/20 0819 12/16/20 1143 12/16/20 1631 12/16/20 2020 12/17/20 0849  GLUCAP 166* 162* 241* 153* 193*   D-Dimer No results for input(s): DDIMER in the last 72 hours. Hgb A1c No results for input(s): HGBA1C in the last 72 hours. Lipid Profile No results for input(s): CHOL, HDL, LDLCALC, TRIG, CHOLHDL, LDLDIRECT in the last 72 hours. Thyroid function studies No results for input(s): TSH, T4TOTAL, T3FREE, THYROIDAB in the last 72 hours.  Invalid input(s): FREET3 Anemia work up No results for input(s): VITAMINB12, FOLATE, FERRITIN, TIBC, IRON, RETICCTPCT in the last 72 hours. Urinalysis    Component Value Date/Time   COLORURINE YELLOW (A) 12/12/2020 1845   APPEARANCEUR HAZY (A) 12/12/2020 1845   APPEARANCEUR Cloudy (A) 12/02/2019 1259   LABSPEC 1.016 12/12/2020 1845   LABSPEC 1.033 10/08/2014 0144   PHURINE 5.0 12/12/2020 1845   GLUCOSEU NEGATIVE 12/12/2020 1845  GLUCOSEU >=1000 (A) 11/01/2020 1322   HGBUR MODERATE (A) 12/12/2020 1845   BILIRUBINUR SMALL (A) 12/12/2020 1845   BILIRUBINUR Negative 12/02/2019 1259   BILIRUBINUR Negative 10/08/2014 0144   KETONESUR NEGATIVE 12/12/2020 1845   PROTEINUR 30 (A) 12/12/2020 1845   UROBILINOGEN 1.0 11/01/2020 1322   NITRITE NEGATIVE 12/12/2020 1845   LEUKOCYTESUR SMALL (A) 12/12/2020 1845   LEUKOCYTESUR Negative 10/08/2014 0144   Sepsis Labs Invalid input(s): PROCALCITONIN,  WBC,  LACTICIDVEN Microbiology Recent Results (from the past 240 hour(s))  SARS CORONAVIRUS 2 (TAT 6-24 HRS) Nasopharyngeal     Status: None   Collection Time: 12/12/20  5:45 PM   Specimen: Nasopharyngeal  Result Value Ref Range Status   SARS Coronavirus 2 NEGATIVE NEGATIVE Final    Comment: (NOTE) SARS-CoV-2 target nucleic acids are NOT DETECTED.  The SARS-CoV-2 RNA is generally detectable in upper and lower respiratory specimens during the acute phase of infection. Negative results do not preclude SARS-CoV-2 infection, do not rule out co-infections  with other pathogens, and should not be used as the sole basis for treatment or other patient management decisions. Negative results must be combined with clinical observations, patient history, and epidemiological information. The expected result is Negative.  Fact Sheet for Patients: SugarRoll.be  Fact Sheet for Healthcare Providers: https://www.woods-mathews.com/  This test is not yet approved or cleared by the [place] FDA and  has been authorized for detection and/or diagnosis of SARS-CoV-2 by FDA under an Emergency Use Authorization (EUA). This EUA will remain  in effect (meaning this test can be used) for the duration of the COVID-19 declaration under Se ction 564(b)(1) of the Act, 21 U.S.C. section 360bbb-3(b)(1), unless the authorization is terminated or revoked sooner.  Performed at [facility], [place] [address], [place], [place] [address]      Patient was seen and examined on the day of discharge and was found to be in stable condition. Time coordinating discharge: 25 minutes including assessment and coordination of care, as well as examination of the patient.   SIGNED:  Dessa Phi, DO [facility] 12/17/2020, 9:07 AM

## 2020-12-17 NOTE — TOC Transition Note (Signed)
Transition of Care [facility]) - CM/SW Discharge Note   Patient Details  Name: [name] MRN: [id number] Date of Birth: [birth year]  [facility] CM/SW Contact:  Izola Price, RN Phone Number: 12/17/2020, 12:28 PM   Clinical Narrative:   [facility] picked up patient for transport to home address at 1228. Facesheet and Med. Nec. Form printed to unit per request. Simmie Davies RN CM     Final next level of care: [place] Barriers to Discharge: Barriers Resolved   Patient Goals and CMS Choice Patient states their goals for this hospitalization and ongoing recovery are:: would like to go to rehab CMS Medicare.gov Compare Post Acute Care list provided to:: Patient Choice offered to / list presented to : Patient  Discharge Placement                Patient to be transferred to facility by:  (Spouse has no car, friends is at work.) Name of family member notified: Spouse Patient and family notified of of transfer: 12/17/20  Discharge Plan and Services   Discharge Planning Services: CM Consult Post Acute Care Choice: Home Health          DME Arranged: N/A DME Agency: NA       HH Arranged: RN,PT,OT,Nurse's Aide [place] Agency: [facility] Date Venesa Semidey Agency Contacted: 12/15/20 Time [place]: [place] Representative spoke with at [place]: [place]) Interventions     Readmission Risk Interventions Readmission Risk Prevention Plan 10/01/2019 07/18/2019 07/16/2019  Transportation Screening Complete Complete Complete  Medication Review Press photographer) Complete Complete Complete  PCP or Specialist appointment within 3-5 days of discharge Complete Complete -  [place] or Home Care Consult Complete Complete Complete  SW Recovery Care/Counseling Consult Complete Complete -  Palliative Care Screening Not Applicable Not Applicable Not [place] - Not Applicable Patient Refused  Some recent  data might be hidden

## 2020-12-17 NOTE — TOC Transition Note (Signed)
Transition of Care [facility]) - CM/SW Discharge Note   Patient Details  Name: MARAKI MACQUARRIE MRN: [id number] Date of Birth: April 06, 1956  Transition of Care [facility]) CM/SW Contact:  Izola Price, RN Phone Number: 12/17/2020, 10:15 AM   Clinical Narrative:  Patient being discharged to HOME today with [facility] services arranged via [facility] by prior CM. Spoke with husband and he is without a car or DL and cannot pick patient up from hospital upon discharge today. A friend takes her on errands and to appointments but is at work today until 3 pm or 5 pm per spouse. Patient mental status returned to baseline but has had some orthostatic BP issues and HH PT/OT/aide ordered so transport via taxi on her own is not considered safe for patient/risk of harm. Attempt to arrange Non-emergent transport to her home address where spouse is currently present in home. Simmie Davies RN CM     Final next level of care: [place] Barriers to Discharge: Barriers Resolved   Patient Goals and CMS Choice Patient states their goals for this hospitalization and ongoing recovery are:: would like to go to rehab CMS Medicare.gov Compare Post Acute Care list provided to:: Patient Choice offered to / list presented to : Patient  Discharge Placement                Patient to be transferred to facility by:  (Spouse has no car, friends is at work.) Name of family member notified: Spouse Patient and family notified of of transfer: 12/17/20  Discharge Plan and Services   Discharge Planning Services: CM Consult Post Acute Care Choice: Home Health          DME Arranged: N/A DME Agency: NA       HH Arranged: RN,PT,OT,Nurse's Aide [place] Agency: [facility] Date [place] Agency Contacted: 12/15/20 Time [place]: [place] Representative spoke with at [place]: [place] ([place]) Interventions     Readmission Risk Interventions Readmission Risk Prevention Plan 10/01/2019  07/18/2019 07/16/2019  Transportation Screening Complete Complete Complete  Medication Review Press photographer) Complete Complete Complete  PCP or Specialist appointment within 3-5 days of discharge Complete Complete -  [place] or Home Care Consult Complete Complete Complete  SW Recovery Care/Counseling Consult Complete Complete -  Palliative Care Screening Not Applicable Not Applicable Not [place] - Not Applicable Patient Refused  Some recent data might be hidden

## 2020-12-19 ENCOUNTER — Telehealth: Payer: Self-pay

## 2020-12-19 NOTE — Telephone Encounter (Signed)
Transition Care Management Follow-up Telephone Call  Date of discharge and from where: 12/17/2020, [facility]  How have you been since you were released from the hospital? Patient is doing good. At home resting comfortably.   Any questions or concerns? No  Items Reviewed:  Did the pt receive and understand the discharge instructions provided? Yes   Medications obtained and verified? Yes   Other? No   Any new allergies since your discharge? No   Dietary orders reviewed? Yes  Do you have support at home? Yes   Home Care and Equipment/Supplies: Were home health services ordered? not applicable If so, what is the name of the agency? N/A  Has the agency set up a time to come to the patient's home? not applicable Were any new equipment or medical supplies ordered?  No What is the name of the medical supply agency? N/A Were you able to get the supplies/equipment? not applicable Do you have any questions related to the use of the equipment or supplies? No  Functional Questionnaire: (I = Independent and D = Dependent) ADLs: I  Bathing/Dressing- I  Meal Prep- I  Eating- I  Maintaining continence- I  Transferring/Ambulation- I  Managing Meds- I  Follow up appointments reviewed:   PCP Hospital f/u appt confirmed? Yes  Scheduled to see Alma Friendly, NP on 12/28/2020 @ 2:20 pm.  [place] [facility] f/u appt confirmed? N/A   Are transportation arrangements needed? No   If their condition worsens, is the pt aware to call PCP or go to the Emergency Dept.? Yes  Was the patient provided with contact information for the PCP's office or ED? Yes  Was to pt encouraged to call back with questions or concerns? Yes
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
## 2020-12-19 NOTE — Telephone Encounter (Signed)
Noted, agree with evaluation

## 2020-12-20 ENCOUNTER — Telehealth: Payer: Self-pay

## 2020-12-20 NOTE — Telephone Encounter (Signed)
She has no showed and cancelled numerous appointments with me in the past. Need to make sure she comes as scheduled.   Also, has she been notified of her no shows/cancellations?

## 2020-12-20 NOTE — Telephone Encounter (Signed)
LM for patient to call office for pre virtual appointment questions.

## 2020-12-20 NOTE — Progress Notes (Deleted)
Appointment canceled by patient

## 2020-12-20 NOTE — Telephone Encounter (Signed)
Pt was called concerning her virtual appointment tomorrow. Her son answered the phone and went to call her to the phone. He came back and stated that she was unavailable at this time. He stated that she would call back later because she needed her pain medications. He was informed that she needed to come in for an appointment and Dr. Dossie Arbour will not be sending in prescriptions for her virtual. He stated that they didn't have any transportation. He was informed to let [place] know to call back for an appointment when transportation was available.

## 2020-12-21 ENCOUNTER — Telehealth (HOSPITAL_BASED_OUTPATIENT_CLINIC_OR_DEPARTMENT_OTHER): Payer: Medicare HMO | Admitting: Pain Medicine

## 2020-12-21 DIAGNOSIS — Z5329 Procedure and treatment not carried out because of patient's decision for other reasons: Secondary | ICD-10-CM

## 2020-12-22 ENCOUNTER — Telehealth: Payer: Self-pay

## 2020-12-22 NOTE — Telephone Encounter (Signed)
Vm from pt stating she wants a call back to talk about her medications.  I attempted to call pt for more info, no answer.

## 2020-12-28 ENCOUNTER — Telehealth: Payer: Self-pay

## 2020-12-28 ENCOUNTER — Ambulatory Visit: Payer: Medicare HMO | Admitting: Primary Care

## 2020-12-28 NOTE — Telephone Encounter (Signed)
Called patient due to missed appointment today.  she had a ride but he never showed up. Are you aware of any transportation that would be available for her?

## 2020-12-28 NOTE — Telephone Encounter (Signed)
I'm going to get Larene Beach, RN involved to see if she's aware of any resources. Larene Beach, this patient no show's and cancels quite frequently. Part of this is due to her lack of transportation. Do we have any resources?

## 2020-12-28 NOTE — Telephone Encounter (Signed)
Called patient and spoke to her apportionment was made for follow up and patient did not show for scheduled appointment.

## 2020-12-29 NOTE — Telephone Encounter (Signed)
Here is information for [facility] Transportation services.    This is for services to and from [place] within a 75 mile radius.      If there are needs outside of the [facility] system or beyond 75 miles, then our office will be contacted to consider whether we will incur the cost.  We have to be the ones to either:  Call [phone] to make the (new patient ) enrollment request OR  We complete all forms above and scan in and email to [email or web address] (They prefer we do this, especially if we have the patient in the office because the waiver form has to be signed and in hand prior to the service).     If we have any questions we can email Cameron Proud or call the [phone] number.  Forms have been placed on Joellen's desk.

## 2020-12-29 NOTE — Telephone Encounter (Signed)
Many thanks to you guys! [name], please have her rescheduled to see me.

## 2020-12-30 DIAGNOSIS — E162 Hypoglycemia, unspecified: Secondary | ICD-10-CM | POA: Diagnosis not present

## 2020-12-30 DIAGNOSIS — I4891 Unspecified atrial fibrillation: Secondary | ICD-10-CM | POA: Diagnosis not present

## 2021-01-06 NOTE — Telephone Encounter (Signed)
Appointment has been made forms for transportation have ben filled out and given to [place] to e-mail.

## 2021-01-10 ENCOUNTER — Telehealth: Payer: Self-pay | Admitting: Primary Care

## 2021-01-10 NOTE — Progress Notes (Signed)
PROVIDER NOTE: Information contained herein reflects review and annotations entered in association with encounter. Interpretation of such information and data should be left to medically-trained personnel. Information provided to patient can be located elsewhere in the medical record under "Patient Instructions". Document created using STT-dictation technology, any transcriptional errors that may result from process are unintentional.    Patient: [name]  Service Category: E/M  Provider: Gaspar Cola, MD  DOB: 1956-08-09  DOS: 01/11/2021  Specialty: Interventional Pain Management  MRN: [id number]  Setting: Ambulatory outpatient  PCP: [name], NP  Type: Established Patient    Referring Provider: [name], NP  Location: Office  Delivery: Face-to-face     HPI  [name], a 65 y.o. year old female, is here today because of her Chronic bilateral low back pain without sciatica [M54.50, G89.29]. [name] primary complain today is Back Pain (low) Last encounter: My last encounter with her was on 10/07/2020. Pertinent problems: [name] has Chronic pain syndrome; Myofascial pain syndrome; Chronic intermittent abdominal pain; History of Clostridium difficile; Chronic low back pain (1ry area of Pain) (Bilateral) (L>R) w/ sciatica (Bilateral); Chronic lower extremity pain (2ry area of Pain) (Bilateral) (L>R); Chronic neck pain; Intractable cyclical vomiting with nausea; DDD (degenerative disc disease), lumbar; Lumbar intervertebral disc displacement; Chronic musculoskeletal pain; Chronic generalized pain; Fibromyalgia syndrome; Lower extremity edema; Lumbar facet syndrome (Bilateral) (R>L); Closed fracture of left distal fibula; Acute medial meniscus tear of left knee; Closed fracture of lateral malleolus; Weakness of both lower extremities; Acute pain of left foot; Cervicalgia; Cervical facet syndrome; Cervical facet arthropathy; Lumbosacral facet arthropathy; and Chronic  low back pain (1ry area of Pain) (Bilateral) w/o sciatica on their pertinent problem list. Pain Assessment: Severity of Chronic pain is reported as a 8 /10. Location: Back Lower/radiates down both legs to ankle in the back. Onset: More than a month ago. Quality: Sharp,Constant. Timing: Constant. Modifying factor(s): medications. Vitals:  height is 5' 3"  (1.6 m) and weight is 159 lb (72.1 kg). Her temperature is 97.3 F (36.3 C) (abnormal). Her blood pressure is 132/82 and her pulse is 99. Her respiration is 18 and oxygen saturation is 100%.   Reason for encounter: follow-up evaluation.  Last prescription written for this patient was on 11/10/2020 for a total of 42 pills with instructions to taper down the oxycodone IR 5 mg from 3 tablets/day, by 1 tablet/day every 7 days.  This prescription was meant to last for 21 days, in case the patient needed to titrated down slowly however, the patient was asked in multiple locations to come in for monitoring. The last time that this patient was seen face-to-face was on 04/06/2020. At that time we had done a UDS that demonstrated the patient not to have any of the prescribed medicine in the system.  The patient was subsequently requested to come in face-to-face, but she managed to have an excuse to switch her visits from face-to-face to virtual on the 8 subsequent encounters.  Because of the fact that the patient is very high risk and failed to attend controlled substance monitoring sessions, the decision was made to taper her off of these medications.  This will no longer be a therapeutic option for her management.  It is also important to note that on 12/12/2020, when I called the patient, the family member that picked up the phone and went to notify her of the call, was unable to wake up the patient.  I am concerned that  this level of sleep may have been chemically induced.  In reviewing the medical record, I have noticed that the patient had CT scans of the cervical  spine done on 11/09/2020 secondary to a fall; again on 09/27/2020 secondary to another fall with head and neck trauma; again on 08/27/2020, also secondary to a fall; on 08/13/2019, also due to a fall; on 08/18/2018, again due to a fall; MRI of the cervical spine on 10/04/2015 due to a fall; and CT of cervical spine on 05/27/2015, again due to a fall.  Today the patient came into the clinics, apparently under the impression that what we were doing was a "Drug Holiday".  Today I clarified for her that we are not doing a "Drug Holiday", that on 11/10/2020 we permanently discontinued her opioids and we are no longer offering that option as part of her chronic pain management treatment.  She indicated having understood.  Today the patient notified me that she is no longer taking Plavix for her atrial fibrillation.  In fact she indicates not taking anything for it.  I have informed the patient that she needs to have a conversation with her primary care physician regarding this since atrial fibrillation is associated with the develop meant of blood clots that can lead to pulmonary embolism, embolic myocardial infarction, and/or embolic cerebrovascular events with the possibility of death or permanent damage.  She understood and agreed to contact her PCP for that.  Today we will be referring the patient to neurology for evaluation of her constant recurrent falls, to see if there is a neurological explanation for these.  In addition, I have refer the patient to physical therapy for the treatment of her low back pain as well as to assist her with balance therapy and strengthening exercises to decrease the possibility of further falls.  Today she confirms that her primary pain is that of the lower back, bilaterally.  X-rays have confirmed that she has lumbar facet arthropathy.  We will be scheduling the patient for a diagnostic bilateral lumbar facet block under fluoroscopic guidance and IV sedation.  The patient was informed  of the plan and has agreed with it.  Pharmacotherapy Assessment   Analgesic: NO MORE OPIOIDS!!! VERY High Risk. (See 06/29/2020 and 11/10/2020 note) MAX. MME: 200 mg/day MME/day: 0 mg/day.   Monitoring: [place] PMP: PDMP reviewed during this encounter.       Pharmacotherapy: No side-effects or adverse reactions reported. Compliance: No problems identified. Effectiveness: Clinically acceptable.  Dewayne Shorter, RN  01/11/2021  8:25 AM  Signed Safety precautions to be maintained throughout the outpatient stay will include: orient to surroundings, keep bed in low position, maintain call bell within reach at all times, provide assistance with transfer out of bed and ambulation.    UDS:  Summary  Date Value Ref Range Status  04/06/2020 Note  Final    Comment:    ==================================================================== [employer] Select 13 (MW) ==================================================================== Test                             Result       Flag       Units  Drug Absent but Declared for Prescription Verification   Oxycodone                      Not Detected UNEXPECTED ng/mg creat ==================================================================== Test  Result    Flag   Units      Ref Range   Creatinine              39               mg/dL      >=20 ==================================================================== Declared Medications:  The flagging and interpretation on this report are based on the  following declared medications.  Unexpected results may arise from  inaccuracies in the declared medications.   **Note: The testing scope of this panel includes these medications:   Oxycodone (Roxicodone)   **Note: The testing scope of this panel does not include the  following reported medications:   Albuterol (Ventolin HFA)  Atorvastatin (Lipitor)  Cephalexin (Keflex)  Cyclobenzaprine (Flexeril)  Insulin (Levemir)  Magnesium  (Magonate)  Nitroglycerin (Nitrostat)  Sertraline (Zoloft)  Vitamin D3 ==================================================================== For clinical consultation, please call [phone]. ====================================================================      ROS  Constitutional: Denies any fever or chills Gastrointestinal: No reported hemesis, hematochezia, vomiting, or acute GI distress Musculoskeletal: Denies any acute onset joint swelling, redness, loss of ROM, or weakness Neurological: No reported episodes of acute onset apraxia, aphasia, dysarthria, agnosia, amnesia, paralysis, loss of coordination, or loss of consciousness  Medication Review  Melatonin, insulin detemir, lidocaine, and sertraline  History Review  Allergy: Ms. Schraeder is allergic to latex, aspirin, codeine sulfate, erythromycin, prednisone, rosiglitazone maleate, and tetanus-diphtheria toxoids td. Drug: Ms. Parada  reports no history of drug use. Alcohol:  reports no history of alcohol use. Tobacco:  reports that she has quit smoking. Her smoking use included cigarettes. She has never used smokeless tobacco. Social: Ms. Steffensmeier  reports that she has quit smoking. Her smoking use included cigarettes. She has never used smokeless tobacco. She reports that she does not drink alcohol and does not use drugs. Medical:  has a past medical history of Acute encephalopathy (05/22/2018), Allergy, Anginal pain ([place]), Anxiety, Ascites, C. difficile colitis (07/10/2015), Cancer ([place]), Chronic kidney disease, Cirrhosis of liver not due to alcohol ([place]) (2016), Degenerative disk disease, Diverticulitis, Gastroparesis, GERD (gastroesophageal reflux disease), History of hiatal hernia, Hypertension, Hypothyroid, Hypothyroidism due to amiodarone, Ileus ([place]) (08/01/2015), Intussusception intestine ([place]) (05/2015), Orthostatic hypotension, PAF (paroxysmal atrial fibrillation) ([place]) (03/2015), Pancreatitis, Pneumonia (11/14/2015), Pneumonia  (07/2020), Right ureteral stone (07/14/2016), Sepsis ([place]) (12/15/2017), Sick sinus syndrome ([place]), Stomach ulcer, Stroke ([place]), Syncope (01/2015), Syncope due to orthostatic hypotension (05/18/2015), Tachyarrhythmia (01/10/2016), TIA (transient ischemic attack) (02/2015), Type 1 diabetes ([place]), UTERINE CANCER, HX OF (03/27/2007), and UTI (urinary tract infection) (05/22/2018). Surgical: Ms. Swallow  has a past surgical history that includes Hernia repair; Abdominal hysterectomy; Cholecystectomy; Esophagogastroduodenoscopy (N/A, 04/04/2015); Cardiac catheterization (N/A, 01/12/2016); Esophagogastroduodenoscopy (egd) with propofol (N/A, 01/18/2016); Flexible sigmoidoscopy (N/A, 01/18/2016); Cystoscopy/ureteroscopy/holmium laser (Right, 07/14/2016); Esophagogastroduodenoscopy (N/A, 12/28/2017); Cystoscopy with biopsy (N/A, 12/11/2019); Cystoscopy with fulgeration (N/A, 12/11/2019); Cystoscopy w/ retrogrades (Bilateral, 12/11/2019); Ureteroscopy (Right, 12/11/2019); Cystoscopy with stent placement (Right, 12/11/2019); and Cystoscopy with urethral dilatation (Right, 12/11/2019). Family: family history includes CAD in her brother and sister; Heart attack in her sister; Hypertension in her mother.  Laboratory Chemistry Profile   Renal Lab Results  Component Value Date   BUN 18 12/15/2020   CREATININE 1.18 (H) 12/15/2020   GFR 35.16 (L) 11/01/2020   GFRAA 49 (L) 02/20/2020   GFRNONAA 51 (L) 12/15/2020     Hepatic Lab Results  Component Value Date   AST 29 12/13/2020   ALT 18 12/13/2020   ALBUMIN 3.0 (L) 12/13/2020   ALKPHOS 122 12/13/2020  HCVAB 0.2 09/25/2018   AMYLASE 28 08/01/2015   LIPASE 31 10/08/2020   AMMONIA 29 12/12/2020     Electrolytes Lab Results  Component Value Date   NA 139 12/15/2020   K 3.8 12/15/2020   CL 110 12/15/2020   CALCIUM 8.7 (L) 12/15/2020   MG 1.9 09/29/2020   PHOS 3.3 08/27/2020     Bone Lab Results  Component Value Date   VD25OH 4.7 (L) 11/25/2018     Inflammation (CRP: Acute  Phase) (ESR: Chronic Phase) Lab Results  Component Value Date   CRP 14.8 (H) 07/18/2020   ESRSEDRATE 79 (H) 11/25/2018   LATICACIDVEN 2.2 ([place]) 09/27/2020       Note: Above Lab results reviewed.  Recent Imaging Review  US Venous Img Upper Uni Left (DVT) CLINICAL DATA:  Left upper extremity pain and edema. Swelling at the site of peripheral IV infusion. Evaluate for DVT.  EXAM: LEFT UPPER EXTREMITY VENOUS DOPPLER ULTRASOUND  TECHNIQUE: Gray-scale sonography with graded compression, as well as color Doppler and duplex ultrasound were performed to evaluate the upper extremity deep venous system from the level of the subclavian vein and including the jugular, axillary, basilic, radial, ulnar and upper cephalic vein. Spectral Doppler was utilized to evaluate flow at rest and with distal augmentation maneuvers.  COMPARISON:  None.  FINDINGS: Contralateral Subclavian Vein: Respiratory phasicity is normal and symmetric with the symptomatic side. No evidence of thrombus. Normal compressibility.  Internal Jugular Vein: No evidence of thrombus. Normal compressibility, respiratory phasicity and response to augmentation.  Subclavian Vein: No evidence of thrombus. Normal compressibility, respiratory phasicity and response to augmentation.  Axillary Vein: No evidence of thrombus. Normal compressibility, respiratory phasicity and response to augmentation.  Cephalic Vein: No evidence of thrombus. Normal compressibility, respiratory phasicity and response to augmentation.  Basilic Vein: No evidence of thrombus. Normal compressibility, respiratory phasicity and response to augmentation.  Brachial Veins: No evidence of thrombus. Normal compressibility, respiratory phasicity and response to augmentation.  Radial Veins: No evidence of thrombus. Normal compressibility, respiratory phasicity and response to augmentation.  Ulnar Veins: No evidence of thrombus. Normal  compressibility, respiratory phasicity and response to augmentation.  Venous Reflux:  None visualized.  Other Findings: There is a moderate amount of subcutaneous edema at the level of the forearm.  IMPRESSION: No evidence of DVT within the left upper extremity.  Electronically Signed   By: Sandi Mariscal M.D.   On: 12/14/2020 12:24 Note: Reviewed        Physical Exam  General appearance: Well nourished, well developed, and well hydrated. In no apparent acute distress Mental status: Alert, oriented x 3 (person, place, & time)       Respiratory: No evidence of acute respiratory distress Eyes: PERLA Vitals: BP 132/82   Pulse 99   Temp (!) 97.3 F (36.3 C)   Resp 18   Ht 5' 3"  (1.6 m)   Wt 159 lb (72.1 kg)   SpO2 100%   BMI 28.17 kg/m  BMI: Estimated body mass index is 28.17 kg/m as calculated from the following:   Height as of this encounter: 5' 3"  (1.6 m).   Weight as of this encounter: 159 lb (72.1 kg). Ideal: Ideal body weight: 52.4 kg (115 lb 8.3 oz) Adjusted ideal body weight: 60.3 kg (132 lb 14.6 oz)  Assessment   Status Diagnosis  Controlled Controlled Controlled 1. Chronic low back pain (1ry area of Pain) (Bilateral) w/o sciatica   2. Lumbar facet syndrome (Bilateral) (R>L)   3.  Lumbosacral facet arthropathy   4. DDD (degenerative disc disease), lumbar   5. Chronic lower extremity pain (2ry area of Pain) (Bilateral) (L>R)   6. Cervicalgia   7. Cervical facet syndrome   8. Cervical facet arthropathy   9. Recurrent falls while walking   10. Frequent falls      Updated Problems: Problem  Cervicalgia  Cervical Facet Syndrome  Cervical facet arthropathy   2007 cervical MRI findings of mild facet degenerative changes within the mid cervical spine with mild uncinate spurring C5-6.   Lumbosacral facet arthropathy   2007 Lumbar MRI findings of Facet degenerative changes are appreciated at the L4-5 and L5-S1 levels.   Chronic low back pain (1ry area of Pain)  (Bilateral) w/o sciatica  Chronic low back pain (1ry area of Pain) (Bilateral) (L>R) w/ sciatica (Bilateral)  Chronic lower extremity pain (2ry area of Pain) (Bilateral) (L>R)  Recurrent Falls While Walking   The patient had CT scans of the cervical spine done on 11/09/2020 secondary to a fall; again on 09/27/2020 secondary to another fall with head and neck trauma; again on 08/27/2020, also secondary to a fall; on 08/13/2019, also due to a fall; on 08/18/2018, again due to a fall; MRI of the cervical spine on 10/04/2015 due to a fall; and CT of cervical spine on 05/27/2015, again due to a fall.   Frequent Falls   The patient had CT scans of the cervical spine done on 11/09/2020 secondary to a fall; again on 09/27/2020 secondary to another fall with head and neck trauma; again on 08/27/2020, also secondary to a fall; on 08/13/2019, also due to a fall; on 08/18/2018, again due to a fall; MRI of the cervical spine on 10/04/2015 due to a fall; and CT of cervical spine on 05/27/2015, again due to a fall.     Plan of Care  Problem-specific:  No problem-specific Assessment & Plan notes found for this encounter.  Ms. EMIE SOMMERFELD has a current medication list which includes the following long-term medication(s): levemir flextouch and sertraline.  Pharmacotherapy (Medications Ordered): No orders of the defined types were placed in this encounter.  Orders:  Orders Placed This Encounter  Procedures  . LUMBAR FACET(MEDIAL BRANCH NERVE BLOCK) MBNB    Standing Status:   Future    Standing Expiration Date:   02/11/2021    Scheduling Instructions:     Procedure: Lumbar facet block (AKA.: Lumbosacral medial branch nerve block)     Side: Bilateral     Level: L3-4, L4-5, & L5-S1 Facets (L2, L3, L4, L5, & S1 Medial Branch Nerves)     Sedation: Patient's choice.     Timeframe: ASAA    Order Specific Question:   Where will this procedure be performed?    Answer:   [facility]  . Ambulatory referral to  Physical Therapy    Referral Priority:   Routine    Referral Type:   Physical Medicine    Referral Reason:   Specialty Services Required    Requested Specialty:   Physical Therapy    Number of Visits Requested:   1  . Ambulatory referral to Neurology    Referral Priority:   Routine    Referral Type:   Consultation    Referral Reason:   Specialty Services Required    Referred to Provider:   Anabel Bene, MD    Requested Specialty:   Neurology    Number of Visits Requested:   1   Follow-up  plan:   Return for Procedure (w/ sedation): (B) L-FCT BLK #1.      Interventional Therapies  Risk  Complexity Considerations:   Opioid misuse and non-compliance with monitoring. Multiple abnormal UDS. CKD Uncontrolled type 2 diabetes with history of ketoacidosis CHF; congestive dilated cardiomyopathy History of CVA and TIAs History of non-ST elevated MI Cirrhosis of liver   Planned  Pending:   Opioids permanently discontinued.   Under consideration:   Diagnostic bilateral celiac plexus block  Diagnostic bilateral lumbar facet block  Diagnostic bilateral cervical facet block    Completed:   None procedures done since initial evaluation on 11/25/2018.   Therapeutic  Palliative (PRN) options:   None established      Recent Visits Date Type Provider Dept  12/12/20 Telemedicine Milinda Pointer, MD [facility]  11/10/20 Telemedicine Milinda Pointer, MD [facility]  Showing recent visits within past 90 days and meeting all other requirements Today's Visits Date Type Provider Dept  01/11/21 Office Visit Milinda Pointer, MD [facility]  Showing today's visits and meeting all other requirements Future Appointments No visits were found meeting these conditions. Showing future appointments within next 90 days and meeting all other requirements  I discussed the assessment and treatment plan with the patient. The patient was provided an opportunity  to ask questions and all were answered. The patient agreed with the plan and demonstrated an understanding of the instructions.  Patient advised to call back or seek an in-person evaluation if the symptoms or condition worsens.  Duration of encounter: 30 minutes.  Note by: Gaspar Cola, MD Date: 01/11/2021; Time: 9:08 AM

## 2021-01-10 NOTE — Telephone Encounter (Signed)
   [name] DOB: 07/30/1956 MRN: [id number]   RIDER WAIVER AND RELEASE OF LIABILITY  For purposes of improving physical access to our facilities, [facility] is pleased to partner with third parties to provide [facility] patients or other authorized individuals the option of convenient, on-demand ground transportation services (the [place]") through use of the technology service that enables users to request on-demand ground transportation from independent third-party providers.  By opting to use and accept these [employer], I, the undersigned, hereby agree on behalf of myself, and on behalf of any minor child using the [employer] for whom I am the parent or legal guardian, as follows:  1. Government social research officer provided to me are provided by independent third-party transportation providers who are not [employer] or employees and who are unaffiliated with [employer]. 2. [place] is neither a transportation carrier nor a common or public carrier. 3. [facility] has no control over the quality or safety of the transportation that occurs as a result of the [employer]. 4. [facility] cannot guarantee that any third-party transportation provider will complete any arranged transportation service. 5. [facility] makes no representation, warranty, or guarantee regarding the reliability, timeliness, quality, safety, suitability, or availability of any of the Transport Services or that they will be error free. 6. I fully understand that traveling by vehicle involves risks and dangers of serious bodily injury, including permanent disability, paralysis, and death. I agree, on behalf of myself and on behalf of any minor child using the Transport Services for whom I am the parent or legal guardian, that the entire risk arising out of my use of the [employer] remains solely with me, to the maximum extent permitted under applicable law. 7. The [employer] are provided "as is" and "as available." [facility] disclaims all representations and warranties, express, implied or statutory, not expressly set out in these terms, including the implied warranties of merchantability and fitness for a particular purpose. 8. I hereby waive and release [facility], its agents, employees, officers, directors, representatives, insurers, attorneys, assigns, successors, subsidiaries, and affiliates from any and all past, present, or future claims, demands, liabilities, actions, causes of action, or suits of any kind directly or indirectly arising from acceptance and use of the [employer]. 9. I further waive and release [facility] and its affiliates from all present and future liability and responsibility for any injury or death to persons or damages to property caused by or related to the use of the [employer]. 10. I have read this Waiver and Release of Liability, and I understand the terms used in it and their legal significance. This Waiver is freely and voluntarily given with the understanding that my right (as well as the right of any minor child for whom I am the parent or legal guardian using the [employer]) to legal recourse against  in connection with the [employer] is knowingly surrendered in return for use of these services.   I attest that I read the consent document to [name], gave Ms. Mendel the opportunity to ask questions and answered the questions asked (if any). I affirm that [name] then provided consent for she's participation in this program.     Legrand Pitts

## 2021-01-11 ENCOUNTER — Ambulatory Visit: Payer: Medicare Other | Attending: Pain Medicine | Admitting: Pain Medicine

## 2021-01-11 ENCOUNTER — Other Ambulatory Visit: Payer: Self-pay

## 2021-01-11 ENCOUNTER — Encounter: Payer: Self-pay | Admitting: Pain Medicine

## 2021-01-11 VITALS — BP 132/82 | HR 99 | Temp 97.3°F | Resp 18 | Ht 63.0 in | Wt 159.0 lb

## 2021-01-11 DIAGNOSIS — M545 Low back pain, unspecified: Secondary | ICD-10-CM | POA: Insufficient documentation

## 2021-01-11 DIAGNOSIS — G8929 Other chronic pain: Secondary | ICD-10-CM | POA: Diagnosis not present

## 2021-01-11 DIAGNOSIS — M79605 Pain in left leg: Secondary | ICD-10-CM | POA: Insufficient documentation

## 2021-01-11 DIAGNOSIS — M5136 Other intervertebral disc degeneration, lumbar region: Secondary | ICD-10-CM | POA: Diagnosis not present

## 2021-01-11 DIAGNOSIS — M47816 Spondylosis without myelopathy or radiculopathy, lumbar region: Secondary | ICD-10-CM

## 2021-01-11 DIAGNOSIS — M47812 Spondylosis without myelopathy or radiculopathy, cervical region: Secondary | ICD-10-CM | POA: Diagnosis not present

## 2021-01-11 DIAGNOSIS — M79604 Pain in right leg: Secondary | ICD-10-CM

## 2021-01-11 DIAGNOSIS — M47817 Spondylosis without myelopathy or radiculopathy, lumbosacral region: Secondary | ICD-10-CM | POA: Diagnosis not present

## 2021-01-11 DIAGNOSIS — R296 Repeated falls: Secondary | ICD-10-CM | POA: Insufficient documentation

## 2021-01-11 DIAGNOSIS — M542 Cervicalgia: Secondary | ICD-10-CM | POA: Diagnosis not present

## 2021-01-11 NOTE — Progress Notes (Signed)
Safety precautions to be maintained throughout the outpatient stay will include: orient to surroundings, keep bed in low position, maintain call bell within reach at all times, provide assistance with transfer out of bed and ambulation.  

## 2021-01-11 NOTE — Patient Instructions (Signed)
____________________________________________________________________________________________  Preparing for Procedure with Sedation  Procedure appointments are limited to planned procedures: . No Prescription Refills. . No disability issues will be discussed. . No medication changes will be discussed.  Instructions: . Oral Intake: Do not eat or drink anything for at least 8 hours prior to your procedure. (Exception: Blood Pressure Medication. See below.) . Transportation: Unless otherwise stated by your physician, you may drive yourself after the procedure. . Blood Pressure Medicine: Do not forget to take your blood pressure medicine with a sip of water the morning of the procedure. If your Diastolic (lower reading)is above 100 mmHg, elective cases will be cancelled/rescheduled. . Blood thinners: These will need to be stopped for procedures. Notify our staff if you are taking any blood thinners. Depending on which one you take, there will be specific instructions on how and when to stop it. . Diabetics on insulin: Notify the staff so that you can be scheduled 1st case in the morning. If your diabetes requires high dose insulin, take only  of your normal insulin dose the morning of the procedure and notify the staff that you have done so. . Preventing infections: Shower with an antibacterial soap the morning of your procedure. . Build-up your immune system: Take 1000 mg of Vitamin C with every meal (3 times a day) the day prior to your procedure. Marland Kitchen Antibiotics: Inform the staff if you have a condition or reason that requires you to take antibiotics before dental procedures. . Pregnancy: If you are pregnant, call and cancel the procedure. . Sickness: If you have a cold, fever, or any active infections, call and cancel the procedure. . Arrival: You must be in the facility at least 30 minutes prior to your scheduled procedure. . Children: Do not bring children with you. . Dress appropriately:  Bring dark clothing that you would not mind if they get stained. . Valuables: Do not bring any jewelry or valuables.  Reasons to call and reschedule or cancel your procedure: (Following these recommendations will minimize the risk of a serious complication.) . Surgeries: Avoid having procedures within 2 weeks of any surgery. (Avoid for 2 weeks before or after any surgery). . Flu Shots: Avoid having procedures within 2 weeks of a flu shots or . (Avoid for 2 weeks before or after immunizations). . Barium: Avoid having a procedure within 7-10 days after having had a radiological study involving the use of radiological contrast. (Myelograms, Barium swallow or enema study). . Heart attacks: Avoid any elective procedures or surgeries for the initial 6 months after a "Myocardial Infarction" (Heart Attack). . Blood thinners: It is imperative that you stop these medications before procedures. Let us know if you if you take any blood thinner.  . Infection: Avoid procedures during or within two weeks of an infection (including chest colds or gastrointestinal problems). Symptoms associated with infections include: Localized redness, fever, chills, night sweats or profuse sweating, burning sensation when voiding, cough, congestion, stuffiness, runny nose, sore throat, diarrhea, nausea, vomiting, cold or Flu symptoms, recent or current infections. It is specially important if the infection is over the area that we intend to treat. Marland Kitchen Heart and lung problems: Symptoms that may suggest an active cardiopulmonary problem include: cough, chest pain, breathing difficulties or shortness of breath, dizziness, ankle swelling, uncontrolled high or unusually low blood pressure, and/or palpitations. If you are experiencing any of these symptoms, cancel your procedure and contact your primary care physician for an evaluation.  Remember:  Regular Business hours are:  Monday to Thursday 8:00 AM to 4:00 PM  Provider's  Schedule: Milinda Pointer, MD:  Procedure days: Tuesday and Thursday 7:30 AM to 4:00 PM  Gillis Santa, MD:  Procedure days: Monday and Wednesday 7:30 AM to 4:00 PM ____________________________________________________________________________________________   ____________________________________________________________________________________________  General Risks and Possible Complications  Patient Responsibilities: It is important that you read this as it is part of your informed consent. It is our duty to inform you of the risks and possible complications associated with treatments offered to you. It is your responsibility as a patient to read this and to ask questions about anything that is not clear or that you believe was not covered in this document.  Patient's Rights: You have the right to refuse treatment. You also have the right to change your mind, even after initially having agreed to have the treatment done. However, under this last option, if you wait until the last second to change your mind, you may be charged for the materials used up to that point.  Introduction: Medicine is not an Chief Strategy Officer. Everything in Medicine, including the lack of treatment(s), carries the potential for danger, harm, or loss (which is by definition: Risk). In Medicine, a complication is a secondary problem, condition, or disease that can aggravate an already existing one. All treatments carry the risk of possible complications. The fact that a side effects or complications occurs, does not imply that the treatment was conducted incorrectly. It must be clearly understood that these can happen even when everything is done following the highest safety standards.  No treatment: You can choose not to proceed with the proposed treatment alternative. The "PRO(s)" would include: avoiding the risk of complications associated with the therapy. The "CON(s)" would include: not getting any of the treatment  benefits. These benefits fall under one of three categories: diagnostic; therapeutic; and/or palliative. Diagnostic benefits include: getting information which can ultimately lead to improvement of the disease or symptom(s). Therapeutic benefits are those associated with the successful treatment of the disease. Finally, palliative benefits are those related to the decrease of the primary symptoms, without necessarily curing the condition (example: decreasing the pain from a flare-up of a chronic condition, such as incurable terminal cancer).  General Risks and Complications: These are associated to most interventional treatments. They can occur alone, or in combination. They fall under one of the following six (6) categories: no benefit or worsening of symptoms; bleeding; infection; nerve damage; allergic reactions; and/or death. 1. No benefits or worsening of symptoms: In Medicine there are no guarantees, only probabilities. No healthcare provider can ever guarantee that a medical treatment will work, they can only state the probability that it may. Furthermore, there is always the possibility that the condition may worsen, either directly, or indirectly, as a consequence of the treatment. 2. Bleeding: This is more common if the patient is taking a blood thinner, either prescription or over the counter (example: Goody Powders, Fish oil, Aspirin, Garlic, etc.), or if suffering a condition associated with impaired coagulation (example: Hemophilia, cirrhosis of the liver, low platelet counts, etc.). However, even if you do not have one on these, it can still happen. If you have any of these conditions, or take one of these drugs, make sure to notify your treating physician. 3. Infection: This is more common in patients with a compromised immune system, either due to disease (example: diabetes, cancer, human immunodeficiency virus [HIV], etc.), or due to medications or treatments (example: therapies used to treat  cancer and  rheumatological diseases). However, even if you do not have one on these, it can still happen. If you have any of these conditions, or take one of these drugs, make sure to notify your treating physician. 4. Nerve Damage: This is more common when the treatment is an invasive one, but it can also happen with the use of medications, such as those used in the treatment of cancer. The damage can occur to small secondary nerves, or to large primary ones, such as those in the spinal cord and brain. This damage may be temporary or permanent and it may lead to impairments that can range from temporary numbness to permanent paralysis and/or brain death. 5. Allergic Reactions: Any time a substance or material comes in contact with our body, there is the possibility of an allergic reaction. These can range from a mild skin rash (contact dermatitis) to a severe systemic reaction (anaphylactic reaction), which can result in death. 6. Death: In general, any medical intervention can result in death, most of the time due to an unforeseen complication. ____________________________________________________________________________________________

## 2021-01-18 ENCOUNTER — Ambulatory Visit: Payer: Medicare HMO | Admitting: Primary Care

## 2021-01-25 ENCOUNTER — Emergency Department
Admission: EM | Admit: 2021-01-25 | Discharge: 2021-01-25 | Disposition: A | Discharge status: Home / Self Care | Payer: Medicare Other | Attending: Emergency Medicine | Admitting: Emergency Medicine

## 2021-01-25 ENCOUNTER — Other Ambulatory Visit: Payer: Self-pay

## 2021-01-25 ENCOUNTER — Ambulatory Visit: Payer: Medicare Other | Attending: Pain Medicine

## 2021-01-25 ENCOUNTER — Encounter: Payer: Self-pay | Admitting: Emergency Medicine

## 2021-01-25 ENCOUNTER — Emergency Department: Payer: Medicare Other

## 2021-01-25 DIAGNOSIS — Z8542 Personal history of malignant neoplasm of other parts of uterus: Secondary | ICD-10-CM | POA: Insufficient documentation

## 2021-01-25 DIAGNOSIS — Z8616 Personal history of COVID-19: Secondary | ICD-10-CM | POA: Insufficient documentation

## 2021-01-25 DIAGNOSIS — R531 Weakness: Secondary | ICD-10-CM | POA: Diagnosis not present

## 2021-01-25 DIAGNOSIS — I10 Essential (primary) hypertension: Secondary | ICD-10-CM | POA: Diagnosis not present

## 2021-01-25 DIAGNOSIS — Z794 Long term (current) use of insulin: Secondary | ICD-10-CM | POA: Diagnosis not present

## 2021-01-25 DIAGNOSIS — Z91040 Latex allergy status: Secondary | ICD-10-CM | POA: Diagnosis not present

## 2021-01-25 DIAGNOSIS — E1129 Type 2 diabetes mellitus with other diabetic kidney complication: Secondary | ICD-10-CM | POA: Insufficient documentation

## 2021-01-25 DIAGNOSIS — R079 Chest pain, unspecified: Secondary | ICD-10-CM | POA: Insufficient documentation

## 2021-01-25 DIAGNOSIS — E1169 Type 2 diabetes mellitus with other specified complication: Secondary | ICD-10-CM | POA: Insufficient documentation

## 2021-01-25 DIAGNOSIS — E1122 Type 2 diabetes mellitus with diabetic chronic kidney disease: Secondary | ICD-10-CM | POA: Diagnosis not present

## 2021-01-25 DIAGNOSIS — N183 Chronic kidney disease, stage 3 unspecified: Secondary | ICD-10-CM | POA: Diagnosis not present

## 2021-01-25 DIAGNOSIS — I129 Hypertensive chronic kidney disease with stage 1 through stage 4 chronic kidney disease, or unspecified chronic kidney disease: Secondary | ICD-10-CM | POA: Insufficient documentation

## 2021-01-25 DIAGNOSIS — R000 Tachycardia, unspecified: Secondary | ICD-10-CM | POA: Diagnosis not present

## 2021-01-25 DIAGNOSIS — E039 Hypothyroidism, unspecified: Secondary | ICD-10-CM | POA: Insufficient documentation

## 2021-01-25 DIAGNOSIS — R739 Hyperglycemia, unspecified: Secondary | ICD-10-CM | POA: Diagnosis not present

## 2021-01-25 DIAGNOSIS — Z87891 Personal history of nicotine dependence: Secondary | ICD-10-CM | POA: Insufficient documentation

## 2021-01-25 DIAGNOSIS — I25119 Atherosclerotic heart disease of native coronary artery with unspecified angina pectoris: Secondary | ICD-10-CM | POA: Diagnosis not present

## 2021-01-25 DIAGNOSIS — E785 Hyperlipidemia, unspecified: Secondary | ICD-10-CM | POA: Diagnosis not present

## 2021-01-25 DIAGNOSIS — R4020 Unspecified coma: Secondary | ICD-10-CM | POA: Diagnosis not present

## 2021-01-25 DIAGNOSIS — R0789 Other chest pain: Secondary | ICD-10-CM | POA: Diagnosis not present

## 2021-01-25 DIAGNOSIS — E1165 Type 2 diabetes mellitus with hyperglycemia: Secondary | ICD-10-CM | POA: Insufficient documentation

## 2021-01-25 LAB — BASIC METABOLIC PANEL
Anion gap: 10 (ref 5–15)
BUN: 13 mg/dL (ref 8–23)
CO2: 21 mmol/L — ABNORMAL LOW (ref 22–32)
Calcium: 9.7 mg/dL (ref 8.9–10.3)
Chloride: 102 mmol/L (ref 98–111)
Creatinine, Ser: 1.31 mg/dL — ABNORMAL HIGH (ref 0.44–1.00)
GFR, Estimated: 45 mL/min — ABNORMAL LOW (ref >60)
Glucose, Bld: 376 mg/dL — ABNORMAL HIGH (ref 70–99)
Potassium: 3.6 mmol/L (ref 3.5–5.1)
Sodium: 133 mmol/L — ABNORMAL LOW (ref 135–145)

## 2021-01-25 LAB — CBG MONITORING, ED
Glucose-Capillary: 386 mg/dL — ABNORMAL HIGH (ref 70–99)
Glucose-Capillary: 113 mg/dL — ABNORMAL HIGH (ref 70–99)

## 2021-01-25 LAB — CBC
HCT: 35.8 % — ABNORMAL LOW (ref 36.0–46.0)
Hemoglobin: 11.2 g/dL — ABNORMAL LOW (ref 12.0–15.0)
MCH: 27.1 pg (ref 26.0–34.0)
MCHC: 31.3 g/dL (ref 30.0–36.0)
MCV: 86.7 fL (ref 80.0–100.0)
Platelets: 168 10*3/uL (ref 150–400)
RBC: 4.13 MIL/uL (ref 3.87–5.11)
RDW: 15.8 % — ABNORMAL HIGH (ref 11.5–15.5)
WBC: 7.1 10*3/uL (ref 4.0–10.5)
nRBC: 0 % (ref 0.0–0.2)

## 2021-01-25 LAB — URINALYSIS, COMPLETE (UACMP) WITH MICROSCOPIC
Bilirubin Urine: NEGATIVE
Glucose, UA: >=500 mg/dL — AB
Ketones, ur: NEGATIVE mg/dL
Nitrite: NEGATIVE
Protein, ur: 30 mg/dL — AB
RBC / HPF: >50 RBC/hpf — ABNORMAL HIGH (ref 0–5)
Specific Gravity, Urine: 1.027 (ref 1.005–1.030)
pH: 6 (ref 5.0–8.0)

## 2021-01-25 LAB — TROPONIN I (HIGH SENSITIVITY)
Troponin I (High Sensitivity): 8 ng/L (ref <18)
Troponin I (High Sensitivity): 8 ng/L (ref <18)

## 2021-01-25 MED ORDER — SODIUM CHLORIDE 0.9 % IV BOLUS
1000.0000 mL | Freq: Once | INTRAVENOUS | Status: AC
  Administered 2021-01-25: 1000 mL via INTRAVENOUS

## 2021-01-25 NOTE — ED Triage Notes (Signed)
[facility] brings pt in from home for hyperglycemia

## 2021-01-25 NOTE — ED Notes (Signed)
Introduced self to pt. Pt resting with fluids going. Pt denies any needs.

## 2021-01-25 NOTE — ED Triage Notes (Addendum)
Pt arrived via [facility] reports she had elevated blood sugars tonight, states her meter read HIGH multiple times this evening. Pt states she took insulin, but continued to remain high.  Pt states she has been feeling disoriented more lately and states she feels like she could cry, but is not sure why.   Pt also c/o chest pain and back pain. Pt has multiple medical complaints.

## 2021-01-25 NOTE — ED Provider Notes (Signed)
[facility] Emergency Department Provider Note   ____________________________________________   Event Date/Time   First MD Initiated Contact with Patient 01/25/21 [phone]     (approximate)  I have reviewed the triage vital signs and the nursing notes.   HISTORY  Chief Complaint Hyperglycemia and Chest Pain    HPI [name] is a 65 y.o. female with past medical history of hypertension, hyperlipidemia, diabetes, atrial fibrillation, cirrhosis, and chronic orthostatic hypotension on midodrine who presents to the ED for hyperglycemia.  Patient reports that she checked her blood sugar multiple times at home and it read "high."  She states she has been feeling weak and lightheaded, denies any fevers, cough, or shortness of breath.  She denies any hematuria, dysuria, flank pain, nausea, or vomiting.  She reports giving herself a bolus dose of insulin prior to arrival, states she has been compliant with her usual insulin regimen.  Her insulin dose did recently have to be decreased following admission for hypoglycemia.  She additionally endorses sharp pain in the center of her chest starting today which is constant and not exacerbated or alleviated by anything.  She states she has been falling recently but this is an ongoing problem for multiple months.  She denies hitting her head or losing consciousness.        Past Medical History:  Diagnosis Date  . Acute encephalopathy 05/22/2018  . Allergy   . Anginal pain ([place])   . Anxiety   . Ascites   . C. difficile colitis 07/10/2015  . Cancer (HCC)    HX OF CANCER OF UTERUS   . Chronic kidney disease    States she only has 1 working kidney  . Cirrhosis of liver not due to alcohol ([place]) 2016  . Degenerative disk disease   . Diverticulitis   . Gastroparesis   . GERD (gastroesophageal reflux disease)   . History of hiatal hernia   . Hypertension   . Hypothyroid   . Hypothyroidism due to amiodarone   . Ileus  ([place]) 08/01/2015  . Intussusception intestine ([place]) 05/2015  . Orthostatic hypotension   . PAF (paroxysmal atrial fibrillation) ([place]) 03/2015   a. new onset 03/2015 in setting of intractable N/V; b. on Eliquis 5 mg bid; c. CHADSVASc 4 (DM, TIA x 2, female)  . Pancreatitis   . Pneumonia 11/14/2015  . Pneumonia 07/2020  . Right ureteral stone 07/14/2016  . Sepsis ([place]) 12/15/2017  . Sick sinus syndrome ([place])   . Stomach ulcer   . Stroke [facility])    with minimal left sided weakness  . Syncope 01/2015  . Syncope due to orthostatic hypotension 05/18/2015  . Tachyarrhythmia 01/10/2016  . TIA (transient ischemic attack) 02/2015  . Type 1 diabetes ([place])    on levemir  . UTERINE CANCER, HX OF 03/27/2007   Qualifier: Diagnosis of  By: Maxie Better FNP, Rosalita Levan   . UTI (urinary tract infection) 05/22/2018    Patient Active Problem List   Diagnosis Date Noted  . Cervicalgia 01/11/2021  . Cervical facet syndrome 01/11/2021  . Cervical facet arthropathy 01/11/2021  . Lumbosacral facet arthropathy 01/11/2021  . Recurrent falls while walking 01/11/2021  . Chronic low back pain (1ry area of Pain) (Bilateral) w/o sciatica 01/11/2021  . Acute encephalopathy 12/13/2020  . History of substance use disorder 12/12/2020  . Substance use disorder 12/12/2020  . Hematuria 10/27/2020  . Hemoptysis 10/27/2020  . Weakness 09/28/2020  . COVID-19 virus infection   . Stroke ([place]) 09/27/2020  .  Hypoglycemia 08/27/2020  . CKD (chronic kidney disease), stage III ([place]) 08/27/2020  . Shortness of breath 07/16/2020  . [place] intertrigo 07/16/2020  . Sepsis ([place]) 11/23/2019  . Acute pyelonephritis 09/30/2019  . Acute pain of left foot 08/18/2019  . Type II diabetes mellitus with renal manifestations ([place]) 08/13/2019  . Weakness of both lower extremities 07/31/2019  . Altered mental status 07/14/2019  . GAD (generalized anxiety disorder) 06/11/2019  . Poor memory 06/11/2019  . Constipation 05/25/2019  . Acute  medial meniscus tear of left knee 05/25/2019  . AKI (acute kidney injury) ([place]) 05/16/2019  . Closed fracture of left distal fibula 05/16/2019  . Sinus tachycardia 05/05/2019  . Congestive dilated cardiomyopathy ([place]) 04/14/2019  . CAD (coronary artery disease) 04/10/2019  . Closed fracture of lateral malleolus 04/10/2019  . Lumbar facet syndrome (Bilateral) (R>L) 03/09/2019  . Long term (current) use of anticoagulants 03/09/2019  . Unspecified cirrhosis of liver ([place]) 02/11/2019  . Hypoalbuminemia 02/11/2019  . Edema due to hypoalbuminemia 02/11/2019  . Lower extremity edema 01/26/2019  . Prolonged QT interval 12/17/2018  . Hypomagnesemia 12/17/2018  . Uncontrolled type 2 diabetes mellitus ([place]) 12/17/2018  . H/O TIA (transient ischemic attack) and stroke 12/17/2018  . Personal history of surgery to heart and great vessels, presenting hazards to health 12/17/2018  . DDD (degenerative disc disease), lumbar 12/17/2018  . Lumbar intervertebral disc displacement 12/17/2018  . Chronic musculoskeletal pain 12/17/2018  . Chronic generalized pain 12/17/2018  . Hyponatremia 12/17/2018  . Fibromyalgia syndrome 12/17/2018  . Other intervertebral disc displacement, lumbar region 12/17/2018  . Vitamin D deficiency 11/26/2018  . Elevated C-reactive protein (CRP) 11/26/2018  . Elevated sed rate 11/26/2018  . Chronic low back pain (1ry area of Pain) (Bilateral) (L>R) w/ sciatica (Bilateral) 11/25/2018  . Chronic lower extremity pain (2ry area of Pain) (Bilateral) (L>R) 11/25/2018  . Chronic neck pain 11/25/2018  . Pharmacologic therapy 11/25/2018  . Disorder of skeletal system 11/25/2018  . Problems influencing health status 11/25/2018  . Renal mass 10/06/2018  . Chronic intermittent abdominal pain 09/25/2018  . Unspecified abdominal pain 09/25/2018  . Frequent falls 09/12/2018  . Orthostatic hypotension 08/18/2018  . Normochromic normocytic anemia 08/18/2018  . Acute renal failure  superimposed on stage 3a chronic kidney disease ([place]) 08/18/2018  . History of hypothyroidism 08/14/2018  . Medical non-compliance 08/14/2018  . Diabetic ketoacidosis ([place]) 05/22/2018  . Urinary tract infection, acute 05/22/2018  . Diarrhea   . NSTEMI (non-ST elevated myocardial infarction) ([place]) 01/11/2016  . Tachy-brady syndrome ([place]) 12/28/2015  . PAF (paroxysmal atrial fibrillation) ([place]) 11/24/2015  . Type 2 diabetes mellitus with neurologic complication ([place]) 96/[id number]/9381  . S/P cholecystectomy 11/23/2015  . Narcotic abuse ([place]) 11/22/2015  . Chronic superficial gastritis without bleeding 11/22/2015  . History of Clostridium difficile 11/22/2015  . History of TIA (transient ischemic attack) 11/22/2015  . Mixed hyperlipidemia 11/22/2015  . Diverticulosis 11/22/2015  . History of uterine cancer 11/22/2015  . Hypothyroidism, unspecified 11/22/2015  . Intractable cyclical vomiting with nausea 11/22/2015  . Community acquired pneumonia of left lower lobe of lung 11/14/2015  . Narcotic withdrawal ([place]) 11/11/2015  . Hyperlipidemia 08/31/2015  . Hypotension 08/06/2015  . Cryptogenic cirrhosis ([place]) 07/10/2015  . Atrial fibrillation ([place]) 07/10/2015  . Malnutrition of moderate degree 07/04/2015  . Symptomatic bradycardia 05/27/2015  . Chronic anemia 05/19/2015  . Thrombocytopenia ([place]) 05/19/2015  . Hypokalemia 04/06/2015  . Hyperlipidemia with target LDL less than 100 04/06/2015  . Syncope 03/11/2015  . CVA (cerebral vascular  accident) ([place]) 02/15/2015  . Essential hypertension 01/12/2015  . Chronically on opiate therapy 01/12/2015  . Cirrhosis of liver not due to alcohol ([place]) 2016  . S/P Nissen fundoplication (without gastrostomy tube) procedure 05/11/2014  . Dysphagia 04/19/2014  . Myofascial pain syndrome 06/27/2007  . Chronic pain syndrome 03/28/2007  . GERD 03/27/2007  . Diverticulosis of large intestine without perforation or abscess without bleeding 03/27/2007  .  Proteinuria 03/27/2007    Past Surgical History:  Procedure Laterality Date  . ABDOMINAL HYSTERECTOMY    . CARDIAC CATHETERIZATION N/A 01/12/2016   Procedure: Left Heart Cath and Coronary Angiography;  Surgeon: Wellington Hampshire, MD;  Location: [facility];  Service: Cardiovascular;  Laterality: N/A;  . CHOLECYSTECTOMY    . CYSTOSCOPY W/ RETROGRADES Bilateral 12/11/2019   Procedure: CYSTOSCOPY WITH RETROGRADE PYELOGRAM;  Surgeon: Billey Co, MD;  Location: [facility];  Service: Urology;  Laterality: Bilateral;  . CYSTOSCOPY WITH BIOPSY N/A 12/11/2019   Procedure: CYSTOSCOPY WITH BLADDER BIOPSY;  Surgeon: Billey Co, MD;  Location: [facility];  Service: Urology;  Laterality: N/A;  . CYSTOSCOPY WITH FULGERATION N/A 12/11/2019   Procedure: CYSTOSCOPY WITH FULGERATION;  Surgeon: Billey Co, MD;  Location: [facility];  Service: Urology;  Laterality: N/A;  . CYSTOSCOPY WITH STENT PLACEMENT Right 12/11/2019   Procedure: CYSTOSCOPY WITH STENT PLACEMENT;  Surgeon: Billey Co, MD;  Location: [facility];  Service: Urology;  Laterality: Right;  . CYSTOSCOPY WITH URETHRAL DILATATION Right 12/11/2019   Procedure: CYSTOSCOPY WITH URETERAL DILATATION;  Surgeon: Billey Co, MD;  Location: [facility];  Service: Urology;  Laterality: Right;  . CYSTOSCOPY/URETEROSCOPY/HOLMIUM LASER Right 07/14/2016   Procedure: CYSTOSCOPY/URETEROSCOPY/HOLMIUM LASER;  Surgeon: Alexis Frock, MD;  Location: [facility];  Service: Urology;  Laterality: Right;  . ESOPHAGOGASTRODUODENOSCOPY N/A 04/04/2015   Procedure: ESOPHAGOGASTRODUODENOSCOPY (EGD);  Surgeon: Hulen Luster, MD;  Location: [facility]COPY;  Service: Endoscopy;  Laterality: N/A;  . ESOPHAGOGASTRODUODENOSCOPY N/A 12/28/2017   Procedure: ESOPHAGOGASTRODUODENOSCOPY (EGD);  Surgeon: Lin Landsman, MD;  Location[facility];  Service: Gastroenterology;  Laterality: N/A;  . ESOPHAGOGASTRODUODENOSCOPY (EGD) WITH PROPOFOL N/A 01/18/2016   Procedure:  ESOPHAGOGASTRODUODENOSCOPY (EGD) WITH PROPOFOL;  Surgeon: Lucilla Lame, MD;  Location: [facility] ENDOSCOPY;  Service: Endoscopy;  Laterality: N/A;  . FLEXIBLE SIGMOIDOSCOPY N/A 01/18/2016   Procedure: FLEXIBLE SIGMOIDOSCOPY;  Surgeon: Lucilla Lame, MD;  Location: [facility]OS[facility];  Service: Endoscopy;  Laterality: N/A;  . HERNIA REPAIR    . URETEROSCOPY Right 12/11/2019   Procedure: Christen Butter;  Surgeon: Billey Co, MD;  Location: [facility];  Service: Urology;  Laterality: Right;    Prior to Admission medications   Medication Sig Start Date End Date Taking? Authorizing Provider  insulin detemir (LEVEMIR FLEXTOUCH) 100 UNIT/ML FlexPen Inject 7 Units into the skin daily. 12/17/20   Dessa Phi, DO  lidocaine (LIDODERM) 5 % Place 1 patch onto the skin every 12 (twelve) hours. Remove & Discard patch within 12 hours or as directed by MD 11/09/20 11/09/21  Blake Divine, MD  Melatonin 10 MG CAPS Take 1 tablet by mouth at bedtime.    [provider]  sertraline (ZOLOFT) 50 MG tablet Take 1 tablet (50 mg total) by mouth daily. For anxiety and depression. 10/27/20   [name], NP    Allergies Latex, Aspirin, Codeine sulfate, Erythromycin, Prednisone, Rosiglitazone maleate, and Tetanus-diphtheria toxoids td  Family History  Problem Relation Age of Onset  . Hypertension Mother   . CAD Sister   . Heart attack  Sister        Deceased 10/2014  . CAD Brother     Social History Social History   Tobacco Use  . Smoking status: Former Smoker    Types: Cigarettes  . Smokeless tobacco: Never Used  . Tobacco comment: 25 years ago and only smoked occasionally  Vaping Use  . Vaping Use: Never used  Substance Use Topics  . Alcohol use: No  . Drug use: No    Review of Systems  Constitutional: No fever/chills.  Positive for lightheadedness and weakness. Eyes: No visual changes. ENT: No sore throat. Cardiovascular: Positive for chest pain. Respiratory: Denies shortness of  breath. Gastrointestinal: No abdominal pain.  No nausea, no vomiting.  No diarrhea.  No constipation. Genitourinary: Negative for dysuria. Musculoskeletal: Negative for back pain. Skin: Negative for rash. Neurological: Negative for headaches, focal weakness or numbness.  ____________________________________________   PHYSICAL EXAM:  VITAL SIGNS: ED Triage Vitals  Enc Vitals Group     BP 01/25/21 0103 126/80     Pulse Rate 01/25/21 0103 (!) 115     Resp 01/25/21 0103 20     Temp 01/25/21 0103 98 F (36.7 C)     Temp Source 01/25/21 0103 Oral     SpO2 01/25/21 0052 98 %     Weight 01/25/21 0101 159 lb (72.1 kg)     Height 01/25/21 0101 5' 3"  (1.6 m)     Head Circumference --      Peak Flow --      Pain Score 01/25/21 0101 8     Pain Loc --      Pain Edu? --      Excl. in [place]? --     Constitutional: Alert and oriented. Eyes: Conjunctivae are normal. Head: Atraumatic. Nose: No congestion/rhinnorhea. Mouth/Throat: Mucous membranes are moist. Neck: Normal ROM, no midline cervical spine tenderness to palpation. Cardiovascular: Normal rate, regular rhythm. Grossly normal heart sounds.  2+ radial pulses bilaterally. Respiratory: Normal respiratory effort.  No retractions. Lungs CTAB. Gastrointestinal: Soft and nontender. No distention. Genitourinary: deferred Musculoskeletal: No lower extremity tenderness nor edema. Neurologic:  Normal speech and language. No gross focal neurologic deficits are appreciated. Skin:  Skin is warm, dry and intact. No rash noted. Psychiatric: Mood and affect are normal. Speech and behavior are normal.  ____________________________________________   LABS (all labs ordered are listed, but only abnormal results are displayed)  Labs Reviewed  BASIC METABOLIC PANEL - Abnormal; Notable for the following components:      Result Value   Sodium 133 (*)    CO2 21 (*)    Glucose, Bld 376 (*)    Creatinine, Ser 1.31 (*)    GFR, Estimated 45 (*)     All other components within normal limits  CBC - Abnormal; Notable for the following components:   Hemoglobin 11.2 (*)    HCT 35.8 (*)    RDW 15.8 (*)    All other components within normal limits  URINALYSIS, COMPLETE (UACMP) WITH MICROSCOPIC - Abnormal; Notable for the following components:   Color, Urine YELLOW (*)    APPearance HAZY (*)    Glucose, UA >=500 (*)    Hgb urine dipstick LARGE (*)    Protein, ur 30 (*)    Leukocytes,Ua TRACE (*)    RBC / HPF >50 (*)    Bacteria, UA RARE (*)    All other components within normal limits  CBG MONITORING, ED - Abnormal; Notable for the following components:   Glucose-Capillary  386 (*)    All other components within normal limits  URINE CULTURE  CBG MONITORING, ED  TROPONIN I (HIGH SENSITIVITY)  TROPONIN I (HIGH SENSITIVITY)   ____________________________________________  EKG  ED ECG REPORT I, Blake Divine, the attending physician, personally viewed and interpreted this ECG.   Date: 01/25/2021  EKG Time: 1:09  Rate: 109  Rhythm: sinus tachycardia  Axis: Normal  Intervals:none  ST&T Change: None   PROCEDURES  Procedure(s) performed (including Critical Care):  Procedures   ____________________________________________   INITIAL IMPRESSION / ASSESSMENT AND PLAN / ED COURSE       65 year old female with past medical history of hypertension, hyperlipidemia, diabetes, atrial fibrillation, cirrhosis, and orthostatic hypotension on midodrine who presents to the ED for generalized weakness and lightheadedness, noted that her blood sugar was high at home.  Labs here in the ED show that blood sugar has improved after patient gave herself a bolus dose of insulin.  She does have a mild AKI but no significant electrolyte abnormality, we will give IV fluid bolus but hold off on additional insulin.  Blood sugar is likely high due to recent adjustments in her insulin regimen and she will need to follow-up with her PCP for recheck of  blood sugar and to make any further adjustments.  She complains of sharp pain in the center of her chest, atypical for ACS.  EKG shows no evidence of arrhythmia or ischemia and 2 sets of troponin are negative.  Chest x-ray reviewed by me and shows no infiltrate, edema, or effusion.  I doubt PE given her reassuring vital signs with atypical symptoms.  She is appropriate for discharge home with PCP follow-up, was also provided with neurology follow-up for her recurrent falls, which has been a longstanding issue for her.  She was counseled to return to the ED for new worsening symptoms, patient agrees with plan.      ____________________________________________   FINAL CLINICAL IMPRESSION(S) / ED DIAGNOSES  Final diagnoses:  Hyperglycemia  Chest pain, unspecified type     ED Discharge Orders    None       Note:  This document was prepared using Dragon voice recognition software and may include unintentional dictation errors.   Blake Divine, MD 01/25/21 [phone]

## 2021-01-25 NOTE — ED Notes (Signed)
Multiple attempts made for blood draw, unsuccessful, lab notified

## 2021-01-26 ENCOUNTER — Ambulatory Visit: Payer: Medicare Other | Admitting: Primary Care

## 2021-01-26 LAB — URINE CULTURE: Culture: 60000 — AB

## 2021-01-26 NOTE — Progress Notes (Signed)
No encounter on this date. "NO SHOW" or "Cancelled" appointment.

## 2021-01-31 DIAGNOSIS — E161 Other hypoglycemia: Secondary | ICD-10-CM | POA: Diagnosis not present

## 2021-01-31 DIAGNOSIS — I10 Essential (primary) hypertension: Secondary | ICD-10-CM | POA: Diagnosis not present

## 2021-01-31 DIAGNOSIS — R404 Transient alteration of awareness: Secondary | ICD-10-CM | POA: Diagnosis not present

## 2021-01-31 DIAGNOSIS — E162 Hypoglycemia, unspecified: Secondary | ICD-10-CM | POA: Diagnosis not present

## 2021-02-01 ENCOUNTER — Ambulatory Visit: Payer: Medicare Other

## 2021-02-03 ENCOUNTER — Ambulatory Visit: Payer: Medicare Other | Admitting: Primary Care

## 2021-02-03 ENCOUNTER — Telehealth: Payer: Self-pay | Admitting: *Deleted

## 2021-02-03 DIAGNOSIS — E1165 Type 2 diabetes mellitus with hyperglycemia: Secondary | ICD-10-CM

## 2021-02-03 NOTE — Telephone Encounter (Signed)
This is a pattern and huge problem. We got her set up with transportation, right? I don't believe her story. It looks like she makes her pain management appointments and travels to the ED.  I can only give her one insulin pen, she will need an office visit for further refills. I will not refill her cyclobenzaprine until she is seen in the office. I did NOT advise her to increase her Zoloft, I will send enough to last until her next appointment, she will need to be seen for further refills.  Notify her that if she does not show up to her next scheduled appointment then we will have to dismiss her. She has a long history of no shoes and same day cancellations, despite [name] working to get her transportation.   How much Levemir is she injecting?  Please schedule her, let me know when this is done. Okay to add her at 3:20 pm on 05/31.

## 2021-02-03 NOTE — Telephone Encounter (Signed)
Pt called triage with a few issues.  Pt had a 12pm hospital f/u appt she had to cancel today due to the weather her transportation called and cancelled. I cancelled appt and pt will call back next week to r/s appt.  Pt was going to ask PCP these questions at appt.  Pt needs a refill of levemir she used her last one yesterday and has no more. She also wants a refill of flexeril (not on med list), also she said she has been depressed and not sleeping so she increased her zoloft to 2 pills a day and needs a refill of that medication too or asked if there is something different she can take since zoloft isn't working.   [facility]

## 2021-02-08 NOTE — Telephone Encounter (Signed)
Left message to return call to our office.  With man who answered phone

## 2021-02-10 MED ORDER — LEVEMIR FLEXTOUCH 100 UNIT/ML ~~LOC~~ SOPN: 30.0000 [IU] | PEN_INJECTOR | Freq: Two times a day (BID) | SUBCUTANEOUS | 0 refills | Status: DC

## 2021-02-10 NOTE — Telephone Encounter (Signed)
Noted, Rx for Levemir refill at 30 units BID sent to pharmacy.

## 2021-02-10 NOTE — Telephone Encounter (Signed)
Tried to contact pt but no answer and no VM.

## 2021-02-10 NOTE — Telephone Encounter (Signed)
Pt returned call. Pt apologized for not showing up at her apt. She said a lot of things were going on recently but she now has new insurance and they provide transportation with a 3 day notice. Advised pt of the importance of keeping her apts and if she missed one further apt she would be dismissed by her PCP and unable to see any physician at [facility]. Pt was very apologetic and verbalized understanding. Pt reports she was taking 30units levemir BID but has not taken in for 5 days because she has been out. Advised that PCP said she would send it in so to check pharmacy. She would like it sent to [facility]. Scheduled pt for 6/10 and advised again the importance of keeping apt. Pt appreciative and verbalized understanding.

## 2021-02-11 ENCOUNTER — Encounter (HOSPITAL_COMMUNITY): Payer: Self-pay | Admitting: Family Medicine

## 2021-02-11 ENCOUNTER — Other Ambulatory Visit: Payer: Self-pay

## 2021-02-11 ENCOUNTER — Inpatient Hospital Stay (HOSPITAL_COMMUNITY)
Admission: EM | Admit: 2021-02-11 | Discharge: 2021-02-16 | DRG: 091 | Disposition: A | Discharge status: Home Health | Payer: Medicare Other | Attending: Internal Medicine | Admitting: Internal Medicine

## 2021-02-11 ENCOUNTER — Inpatient Hospital Stay (HOSPITAL_COMMUNITY): Payer: Medicare Other

## 2021-02-11 ENCOUNTER — Emergency Department (HOSPITAL_COMMUNITY): Payer: Medicare Other

## 2021-02-11 ENCOUNTER — Other Ambulatory Visit (HOSPITAL_COMMUNITY): Payer: Medicare Other

## 2021-02-11 DIAGNOSIS — Z20822 Contact with and (suspected) exposure to covid-19: Secondary | ICD-10-CM | POA: Diagnosis not present

## 2021-02-11 DIAGNOSIS — Z886 Allergy status to analgesic agent status: Secondary | ICD-10-CM

## 2021-02-11 DIAGNOSIS — N179 Acute kidney failure, unspecified: Secondary | ICD-10-CM | POA: Diagnosis not present

## 2021-02-11 DIAGNOSIS — R569 Unspecified convulsions: Secondary | ICD-10-CM | POA: Diagnosis not present

## 2021-02-11 DIAGNOSIS — I129 Hypertensive chronic kidney disease with stage 1 through stage 4 chronic kidney disease, or unspecified chronic kidney disease: Secondary | ICD-10-CM | POA: Diagnosis not present

## 2021-02-11 DIAGNOSIS — Z8249 Family history of ischemic heart disease and other diseases of the circulatory system: Secondary | ICD-10-CM

## 2021-02-11 DIAGNOSIS — Z9049 Acquired absence of other specified parts of digestive tract: Secondary | ICD-10-CM | POA: Diagnosis not present

## 2021-02-11 DIAGNOSIS — I959 Hypotension, unspecified: Secondary | ICD-10-CM | POA: Diagnosis present

## 2021-02-11 DIAGNOSIS — R4182 Altered mental status, unspecified: Secondary | ICD-10-CM

## 2021-02-11 DIAGNOSIS — Z885 Allergy status to narcotic agent status: Secondary | ICD-10-CM

## 2021-02-11 DIAGNOSIS — R6889 Other general symptoms and signs: Secondary | ICD-10-CM | POA: Diagnosis not present

## 2021-02-11 DIAGNOSIS — B957 Other staphylococcus as the cause of diseases classified elsewhere: Secondary | ICD-10-CM | POA: Diagnosis not present

## 2021-02-11 DIAGNOSIS — I480 Paroxysmal atrial fibrillation: Secondary | ICD-10-CM | POA: Diagnosis not present

## 2021-02-11 DIAGNOSIS — E039 Hypothyroidism, unspecified: Secondary | ICD-10-CM | POA: Diagnosis present

## 2021-02-11 DIAGNOSIS — E1122 Type 2 diabetes mellitus with diabetic chronic kidney disease: Secondary | ICD-10-CM | POA: Diagnosis not present

## 2021-02-11 DIAGNOSIS — I517 Cardiomegaly: Secondary | ICD-10-CM | POA: Diagnosis not present

## 2021-02-11 DIAGNOSIS — Z79890 Hormone replacement therapy (postmenopausal): Secondary | ICD-10-CM

## 2021-02-11 DIAGNOSIS — I499 Cardiac arrhythmia, unspecified: Secondary | ICD-10-CM | POA: Diagnosis not present

## 2021-02-11 DIAGNOSIS — Z87891 Personal history of nicotine dependence: Secondary | ICD-10-CM | POA: Diagnosis not present

## 2021-02-11 DIAGNOSIS — N1831 Chronic kidney disease, stage 3a: Secondary | ICD-10-CM | POA: Diagnosis present

## 2021-02-11 DIAGNOSIS — Z888 Allergy status to other drugs, medicaments and biological substances status: Secondary | ICD-10-CM

## 2021-02-11 DIAGNOSIS — K7460 Unspecified cirrhosis of liver: Secondary | ICD-10-CM | POA: Diagnosis not present

## 2021-02-11 DIAGNOSIS — N390 Urinary tract infection, site not specified: Secondary | ICD-10-CM | POA: Diagnosis not present

## 2021-02-11 DIAGNOSIS — G929 Unspecified toxic encephalopathy: Principal | ICD-10-CM | POA: Diagnosis present

## 2021-02-11 DIAGNOSIS — R29818 Other symptoms and signs involving the nervous system: Secondary | ICD-10-CM | POA: Diagnosis not present

## 2021-02-11 DIAGNOSIS — G9340 Encephalopathy, unspecified: Secondary | ICD-10-CM | POA: Diagnosis not present

## 2021-02-11 DIAGNOSIS — Z743 Need for continuous supervision: Secondary | ICD-10-CM | POA: Diagnosis not present

## 2021-02-11 DIAGNOSIS — R0602 Shortness of breath: Secondary | ICD-10-CM

## 2021-02-11 DIAGNOSIS — Z794 Long term (current) use of insulin: Secondary | ICD-10-CM | POA: Diagnosis not present

## 2021-02-11 DIAGNOSIS — R0902 Hypoxemia: Secondary | ICD-10-CM | POA: Diagnosis not present

## 2021-02-11 DIAGNOSIS — E1165 Type 2 diabetes mellitus with hyperglycemia: Secondary | ICD-10-CM | POA: Diagnosis not present

## 2021-02-11 DIAGNOSIS — Z90710 Acquired absence of both cervix and uterus: Secondary | ICD-10-CM | POA: Diagnosis not present

## 2021-02-11 DIAGNOSIS — K5900 Constipation, unspecified: Secondary | ICD-10-CM | POA: Diagnosis present

## 2021-02-11 DIAGNOSIS — Z79899 Other long term (current) drug therapy: Secondary | ICD-10-CM

## 2021-02-11 DIAGNOSIS — H532 Diplopia: Secondary | ICD-10-CM | POA: Diagnosis present

## 2021-02-11 DIAGNOSIS — Z8542 Personal history of malignant neoplasm of other parts of uterus: Secondary | ICD-10-CM

## 2021-02-11 DIAGNOSIS — Z8673 Personal history of transient ischemic attack (TIA), and cerebral infarction without residual deficits: Secondary | ICD-10-CM | POA: Diagnosis not present

## 2021-02-11 DIAGNOSIS — R404 Transient alteration of awareness: Secondary | ICD-10-CM | POA: Diagnosis not present

## 2021-02-11 DIAGNOSIS — R000 Tachycardia, unspecified: Secondary | ICD-10-CM | POA: Diagnosis not present

## 2021-02-11 DIAGNOSIS — J9811 Atelectasis: Secondary | ICD-10-CM | POA: Diagnosis not present

## 2021-02-11 DIAGNOSIS — R531 Weakness: Secondary | ICD-10-CM | POA: Diagnosis not present

## 2021-02-11 DIAGNOSIS — J189 Pneumonia, unspecified organism: Secondary | ICD-10-CM | POA: Diagnosis present

## 2021-02-11 DIAGNOSIS — Z91040 Latex allergy status: Secondary | ICD-10-CM

## 2021-02-11 LAB — COMPREHENSIVE METABOLIC PANEL
ALT: 24 U/L (ref 0–44)
AST: 44 U/L — ABNORMAL HIGH (ref 15–41)
Albumin: 3.5 g/dL (ref 3.5–5.0)
Alkaline Phosphatase: 123 U/L (ref 38–126)
Anion gap: 11 (ref 5–15)
BUN: 20 mg/dL (ref 8–23)
CO2: 20 mmol/L — ABNORMAL LOW (ref 22–32)
Calcium: 8.5 mg/dL — ABNORMAL LOW (ref 8.9–10.3)
Chloride: 107 mmol/L (ref 98–111)
Creatinine, Ser: 2.58 mg/dL — ABNORMAL HIGH (ref 0.44–1.00)
GFR, Estimated: 20 mL/min — ABNORMAL LOW (ref >60)
Glucose, Bld: 253 mg/dL — ABNORMAL HIGH (ref 70–99)
Potassium: 4.6 mmol/L (ref 3.5–5.1)
Sodium: 138 mmol/L (ref 135–145)
Total Bilirubin: 0.7 mg/dL (ref 0.3–1.2)
Total Protein: 6.3 g/dL — ABNORMAL LOW (ref 6.5–8.1)

## 2021-02-11 LAB — DIFFERENTIAL
Abs Immature Granulocytes: 0.02 10*3/uL (ref 0.00–0.07)
Basophils Absolute: 0 10*3/uL (ref 0.0–0.1)
Basophils Relative: 0 %
Eosinophils Absolute: 0.1 10*3/uL (ref 0.0–0.5)
Eosinophils Relative: 2 %
Immature Granulocytes: 0 %
Lymphocytes Relative: 10 %
Lymphs Abs: 0.8 10*3/uL (ref 0.7–4.0)
Monocytes Absolute: 0.4 10*3/uL (ref 0.1–1.0)
Monocytes Relative: 5 %
Neutro Abs: 6.5 10*3/uL (ref 1.7–7.7)
Neutrophils Relative %: 83 %

## 2021-02-11 LAB — URINALYSIS, MICROSCOPIC (REFLEX)

## 2021-02-11 LAB — RAPID URINE DRUG SCREEN, HOSP PERFORMED
Amphetamines: NOT DETECTED
Barbiturates: NOT DETECTED
Benzodiazepines: NOT DETECTED
Cocaine: NOT DETECTED
Opiates: NOT DETECTED
Tetrahydrocannabinol: NOT DETECTED

## 2021-02-11 LAB — CBC
HCT: 36 % (ref 36.0–46.0)
HCT: 32.3 % — ABNORMAL LOW (ref 36.0–46.0)
Hemoglobin: 10.9 g/dL — ABNORMAL LOW (ref 12.0–15.0)
Hemoglobin: 9.7 g/dL — ABNORMAL LOW (ref 12.0–15.0)
MCH: 28.1 pg (ref 26.0–34.0)
MCH: 28.1 pg (ref 26.0–34.0)
MCHC: 30.3 g/dL (ref 30.0–36.0)
MCHC: 30 g/dL (ref 30.0–36.0)
MCV: 92.8 fL (ref 80.0–100.0)
MCV: 93.6 fL (ref 80.0–100.0)
Platelets: 151 10*3/uL (ref 150–400)
Platelets: 126 10*3/uL — ABNORMAL LOW (ref 150–400)
RBC: 3.88 MIL/uL (ref 3.87–5.11)
RBC: 3.45 MIL/uL — ABNORMAL LOW (ref 3.87–5.11)
RDW: 15.9 % — ABNORMAL HIGH (ref 11.5–15.5)
RDW: 15.9 % — ABNORMAL HIGH (ref 11.5–15.5)
WBC: 7.8 10*3/uL (ref 4.0–10.5)
WBC: 7.4 10*3/uL (ref 4.0–10.5)
nRBC: 0 % (ref 0.0–0.2)
nRBC: 0 % (ref 0.0–0.2)

## 2021-02-11 LAB — I-STAT CHEM 8, ED
BUN: 28 mg/dL — ABNORMAL HIGH (ref 8–23)
Calcium, Ion: 1 mmol/L — ABNORMAL LOW (ref 1.15–1.40)
Chloride: 108 mmol/L (ref 98–111)
Creatinine, Ser: 2.6 mg/dL — ABNORMAL HIGH (ref 0.44–1.00)
Glucose, Bld: 246 mg/dL — ABNORMAL HIGH (ref 70–99)
HCT: 35 % — ABNORMAL LOW (ref 36.0–46.0)
Hemoglobin: 11.9 g/dL — ABNORMAL LOW (ref 12.0–15.0)
Potassium: 4.7 mmol/L (ref 3.5–5.1)
Sodium: 139 mmol/L (ref 135–145)
TCO2: 23 mmol/L (ref 22–32)

## 2021-02-11 LAB — PROTIME-INR
INR: 1.1 (ref 0.8–1.2)
Prothrombin Time: 14 seconds (ref 11.4–15.2)

## 2021-02-11 LAB — TSH: TSH: 4.528 u[IU]/mL — ABNORMAL HIGH (ref 0.350–4.500)

## 2021-02-11 LAB — URINALYSIS, ROUTINE W REFLEX MICROSCOPIC
Bilirubin Urine: NEGATIVE
Glucose, UA: 100 mg/dL — AB
Ketones, ur: 15 mg/dL — AB
Nitrite: NEGATIVE
Protein, ur: 100 mg/dL — AB
Specific Gravity, Urine: >1.03 — ABNORMAL HIGH (ref 1.005–1.030)
pH: 5 (ref 5.0–8.0)

## 2021-02-11 LAB — CREATININE, SERUM
Creatinine, Ser: 2.56 mg/dL — ABNORMAL HIGH (ref 0.44–1.00)
GFR, Estimated: 20 mL/min — ABNORMAL LOW (ref >60)

## 2021-02-11 LAB — ETHANOL: Alcohol, Ethyl (B): <10 mg/dL (ref <10)

## 2021-02-11 LAB — APTT: aPTT: 29 seconds (ref 24–36)

## 2021-02-11 LAB — GLUCOSE, CAPILLARY: Glucose-Capillary: 184 mg/dL — ABNORMAL HIGH (ref 70–99)

## 2021-02-11 LAB — AMMONIA: Ammonia: 50 umol/L — ABNORMAL HIGH (ref 9–35)

## 2021-02-11 LAB — SARS CORONAVIRUS 2 (TAT 6-24 HRS): SARS Coronavirus 2: NEGATIVE

## 2021-02-11 LAB — VITAMIN B12: Vitamin B-12: 341 pg/mL (ref 180–914)

## 2021-02-11 LAB — CBG MONITORING, ED: Glucose-Capillary: 175 mg/dL — ABNORMAL HIGH (ref 70–99)

## 2021-02-11 MED ORDER — ALBUTEROL SULFATE (2.5 MG/3ML) 0.083% IN NEBU: 2.5000 mg | INHALATION_SOLUTION | RESPIRATORY_TRACT | Status: DC | PRN

## 2021-02-11 MED ORDER — ACETAMINOPHEN 650 MG RE SUPP: 650.0000 mg | Freq: Four times a day (QID) | RECTAL | Status: DC | PRN

## 2021-02-11 MED ORDER — DIAZEPAM 5 MG/ML IJ SOLN
2.5000 mg | Freq: Once | INTRAMUSCULAR | Status: AC
  Administered 2021-02-12: 2.5 mg via INTRAVENOUS
  Filled 2021-02-11: qty 2

## 2021-02-11 MED ORDER — ACETAMINOPHEN 325 MG PO TABS
650.0000 mg | ORAL_TABLET | Freq: Four times a day (QID) | ORAL | Status: DC | PRN
  Administered 2021-02-13 (×2): 650 mg via ORAL
  Filled 2021-02-11 (×2): qty 2

## 2021-02-11 MED ORDER — HEPARIN SODIUM (PORCINE) 5000 UNIT/ML IJ SOLN
5000.0000 [IU] | Freq: Three times a day (TID) | INTRAMUSCULAR | Status: DC
  Administered 2021-02-11 – 2021-02-16 (×16): 5000 [IU] via SUBCUTANEOUS
  Filled 2021-02-11 (×13): qty 1

## 2021-02-11 MED ORDER — LACTATED RINGERS IV SOLN: INTRAVENOUS | Status: DC

## 2021-02-11 MED ORDER — INSULIN ASPART 100 UNIT/ML IJ SOLN: 0.0000 [IU] | Freq: Four times a day (QID) | INTRAMUSCULAR | Status: DC | PRN

## 2021-02-11 MED ORDER — SODIUM CHLORIDE 0.9 % IV SOLN
2.0000 g | Freq: Once | INTRAVENOUS | Status: AC
  Administered 2021-02-11: 2 g via INTRAVENOUS
  Filled 2021-02-11: qty 20

## 2021-02-11 MED ORDER — FENTANYL CITRATE (PF) 100 MCG/2ML IJ SOLN
12.5000 ug | Freq: Four times a day (QID) | INTRAMUSCULAR | Status: DC | PRN
  Administered 2021-02-11 – 2021-02-12 (×4): 12.5 ug via INTRAVENOUS
  Filled 2021-02-11 (×4): qty 2

## 2021-02-11 MED ORDER — SODIUM CHLORIDE 0.9 % IV BOLUS
1000.0000 mL | Freq: Once | INTRAVENOUS | Status: AC
  Administered 2021-02-11: 1000 mL via INTRAVENOUS

## 2021-02-11 MED ORDER — SODIUM CHLORIDE 0.9 % IV SOLN
100.0000 mg | Freq: Two times a day (BID) | INTRAVENOUS | Status: DC
  Administered 2021-02-11 – 2021-02-13 (×5): 100 mg via INTRAVENOUS
  Filled 2021-02-11 (×7): qty 100

## 2021-02-11 MED ORDER — LACTULOSE 10 GM/15ML PO SOLN
10.0000 g | Freq: Every day | ORAL | Status: DC
  Administered 2021-02-11: 10 g via ORAL
  Filled 2021-02-11: qty 15

## 2021-02-11 MED ORDER — SODIUM CHLORIDE 0.9 % IV SOLN
1.0000 g | INTRAVENOUS | Status: AC
  Administered 2021-02-12 – 2021-02-15 (×4): 1 g via INTRAVENOUS
  Filled 2021-02-11 (×5): qty 10

## 2021-02-11 NOTE — H&P (Signed)
History and Physical    [name] KMQ:[id number] DOB: 1955/10/06 DOA: 02/11/2021  PCP: [name], NP   Patient coming from: Home  Chief Complaint: Altered mental status  HPI: [name] is a 65 y.o. female with medical history significant for CKD, DMT2, hypothyroidism, Cirrhosis, TIA, Paroxysmal A-fib not on anticoagulation. She presented by EMS for evaluation of AMS. Reportedly her husband found her with her eyes open but unresponsive with a rightward gaze. There was no report of tremors or seizure like activity. No report of foaming at mouth or loss of bladder control. She began to moan and mumble with garbled speech when she was moved. She will now answer some yes no questions but does not make eye contact. Her husband last saw her in her normal state of health 15-20 minutes before he found her unresponsive. Initially she was found to have low systolic BP by EMS with SBP in the 70's. Pt is not able to provide history and no family present. Husband reported to ER physician earlier that [name] is usually independent and talks normally. No report of fever or vomiting.   ED Course: [name] was given IVF hydration in the ER and BP is improved. She is more alert than she was but not at her reported baseline level. Code stroke was initiated and CT head was negative for acute pathology.  Denies having neck acute kidney injury with creatinine of 2.60 with a baseline creatinine of 1.3. Is 28.  Glucose was 246, sodium 139, potassium 4.7, chloride 108, AST 44, ALT 24, bilirubin 0.7, alkaline phosphatase 123.  WBC is 7800, hemoglobin 10.9, hematocrit 36, platelets 151,000.  Urinalysis is negative for small leukocyte esterase with 0-5 squamous epithelium.  Patient has no dysuria or urinary symptoms reported.  Chest x-ray reveals left lower lobe infiltrate versus atelectasis.  Patient was given Rocephin in the emergency room.  Patient has been evaluated by neurology.  Hospitalist service has  been asked admit for further management  Review of Systems:  Accurate ROS cannot be obtained due to acute condition.  Past Medical History:  Diagnosis Date  . Acute encephalopathy 05/22/2018  . Allergy   . Anginal pain ([place])   . Anxiety   . Ascites   . C. difficile colitis 07/10/2015  . Cancer (HCC)    HX OF CANCER OF UTERUS   . Chronic kidney disease    States she only has 1 working kidney  . Cirrhosis of liver not due to alcohol ([place]) 2016  . Degenerative disk disease   . Diverticulitis   . Gastroparesis   . GERD (gastroesophageal reflux disease)   . History of hiatal hernia   . Hypertension   . Hypothyroid   . Hypothyroidism due to amiodarone   . Ileus ([place]) 08/01/2015  . Intussusception intestine ([place]) 05/2015  . Orthostatic hypotension   . PAF (paroxysmal atrial fibrillation) ([place]) 03/2015   a. new onset 03/2015 in setting of intractable N/V; b. on Eliquis 5 mg bid; c. CHADSVASc 4 (DM, TIA x 2, female)  . Pancreatitis   . Pneumonia 11/14/2015  . Pneumonia 07/2020  . Right ureteral stone 07/14/2016  . Sepsis ([place]) 12/15/2017  . Sick sinus syndrome ([place])   . Stomach ulcer   . Stroke [facility])    with minimal left sided weakness  . Syncope 01/2015  . Syncope due to orthostatic hypotension 05/18/2015  . Tachyarrhythmia 01/10/2016  . TIA (transient ischemic attack) 02/2015  . Type 1 diabetes ([place])  on levemir  . UTERINE CANCER, HX OF 03/27/2007   Qualifier: Diagnosis of  By: Maxie Better FNP, Rosalita Levan   . UTI (urinary tract infection) 05/22/2018    Past Surgical History:  Procedure Laterality Date  . ABDOMINAL HYSTERECTOMY    . CARDIAC CATHETERIZATION N/A 01/12/2016   Procedure: Left Heart Cath and Coronary Angiography;  Surgeon: Wellington Hampshire, MD;  Location: [place] CV LAB;  Service: Cardiovascular;  Laterality: N/A;  . CHOLECYSTECTOMY    . CYSTOSCOPY W/ RETROGRADES Bilateral 12/11/2019   Procedure: CYSTOSCOPY WITH RETROGRADE PYELOGRAM;  Surgeon: Billey Co,  MD;  Location: [facility] ORS;  Service: Urology;  Laterality: Bilateral;  . CYSTOSCOPY WITH BIOPSY N/A 12/11/2019   Procedure: CYSTOSCOPY WITH BLADDER BIOPSY;  Surgeon: Billey Co, MD;  Location: [facility] ORS;  Service: Urology;  Laterality: N/A;  . CYSTOSCOPY WITH FULGERATION N/A 12/11/2019   Procedure: CYSTOSCOPY WITH FULGERATION;  Surgeon: Billey Co, MD;  Location: [facility] ORS;  Service: Urology;  Laterality: N/A;  . CYSTOSCOPY WITH STENT PLACEMENT Right 12/11/2019   Procedure: CYSTOSCOPY WITH STENT PLACEMENT;  Surgeon: Billey Co, MD;  Location: [facility] OR[facility];  Service: Urology;  Laterality: Right;  . CYSTOSCOPY WITH URETHRAL DILATATION Right 12/11/2019   Procedure: CYSTOSCOPY WITH URETERAL DILATATION;  Surgeon: Billey Co, MD;  Location: [facility] ORS;  Service: Urology;  Laterality: Right;  . CYSTOSCOPY/URETEROSCOPY/HOLMIUM LASER Right 07/14/2016   Procedure: CYSTOSCOPY/URETEROSCOPY/HOLMIUM LASER;  Surgeon: Alexis Frock, MD;  Location: [facility] ORS;  Service: Urology;  Laterality: Right;  . ESOPHAGOGASTRODUODENOSCOPY N/A 04/04/2015   Procedure: ESOPHAGOGASTRODUODENOSCOPY (EGD);  Surgeon: Hulen Luster, MD;  Location[facility] ENDOSCOPY;  Service: Endoscopy;  Laterality: N/A;  . ESOPHAGOGASTRODUODENOSCOPY N/A 12/28/2017   Procedure: ESOPHAGOGASTRODUODENOSCOPY (EGD);  Surgeon: Lin Landsman, MD;  Location: [facility] ENDOSCOPY;  Service: Gastroenterology;  Laterality: N/A;  . ESOPHAGOGASTRODUODENOSCOPY (EGD) WITH PROPOFOL N/A 01/18/2016   Procedure: ESOPHAGOGASTRODUODENOSCOPY (EGD) WITH PROPOFOL;  Surgeon: Lucilla Lame, MD;  Location: [facility] ENDOSCOPY;  Service: Endoscopy;  Laterality: N/A;  . FLEXIBLE SIGMOIDOSCOPY N/A 01/18/2016   Procedure: FLEXIBLE SIGMOIDOSCOPY;  Surgeon: Lucilla Lame, MD;  Location: [facility] ENDOSCOPY;  Service: Endoscopy;  Laterality: N/A;  . HERNIA REPAIR    . URETEROSCOPY Right 12/11/2019   Procedure: Christen Butter;  Surgeon: Billey Co, MD;  Location: [facility] ORS;  Service:  Urology;  Laterality: Right;    Social History  reports that she has quit smoking. Her smoking use included cigarettes. She has never used smokeless tobacco. She reports that she does not drink alcohol and does not use drugs.  Allergies  Allergen Reactions  . Latex Other (See Comments)    Pt had allergic reaction to tegaderm dressing on IV.  Caused blisters on the skin.  . Aspirin Rash  . Codeine Sulfate Rash  . Erythromycin Rash    Reaction:  Fever   . Prednisone Swelling  . Rosiglitazone Maleate Swelling  . Tetanus-Diphtheria Toxoids Td Rash and Other (See Comments)    Reaction:  Fever     Family History  Problem Relation Age of Onset  . Hypertension Mother   . CAD Sister   . Heart attack Sister        Deceased 30-Nov-2014  . CAD Brother      Prior to Admission medications   Medication Sig Start Date End Date Taking? Authorizing Provider  insulin detemir (LEVEMIR FLEXTOUCH) 100 UNIT/ML FlexPen Inject 30 Units into the skin 2 (two) times daily. 02/10/21   [name], NP  lidocaine (LIDODERM) 5 %  Place 1 patch onto the skin every 12 (twelve) hours. Remove & Discard patch within 12 hours or as directed by MD 11/09/20 11/09/21  Blake Divine, MD  Melatonin 10 MG CAPS Take 1 tablet by mouth at bedtime.    [provider]  sertraline (ZOLOFT) 50 MG tablet Take 1 tablet (50 mg total) by mouth daily. For anxiety and depression. 10/27/20   [name], NP    Physical Exam: Vitals:   02/11/21 0445 02/11/21 0503 02/11/21 0515 02/11/21 0545  BP: 125/77 (!) 133/58 138/68 131/72  Pulse: (!) 101 (!) 102 99 100  Resp: 19 16 15 17   Temp:      TempSrc:      SpO2: 96% 99% 99% 100%  Weight:        Constitutional: NAD, calm, comfortable Vitals:   02/11/21 0445 02/11/21 0503 02/11/21 0515 02/11/21 0545  BP: 125/77 (!) 133/58 138/68 131/72  Pulse: (!) 101 (!) 102 99 100  Resp: 19 16 15 17   Temp:      TempSrc:      SpO2: 96% 99% 99% 100%  Weight:       General:  WDWN, Alert and oriented to person  Eyes: PERRL, conjunctivae normal.  Sclera nonicteric HENT:  [place]/AT, external ears normal.  Nares patent without epistasis.  Mucous membranes are dry. Posterior pharynx clear of any exudate or lesions. Two aphous ulcers on tip of tongue. No laceration of tongue.  Neck: Soft, normal passive range of motion, supple, no masses, no thyromegaly. Trachea midline. No meningeal signs. Respiratory: clear to auscultation bilaterally, no wheezing, no crackles. Normal respiratory effort. No accessory muscle use.  Cardiovascular: Regular rate and rhythm, 2/6 murmur. No rubs / gallops. No extremity edema. 1+ pedal pulses.  Abdomen: Soft, no tenderness, nondistended, no rebound or guarding. No masses palpated. Bowel sounds normoactive Musculoskeletal: FROM. no cyanosis. No joint deformity upper and lower extremities. Normal muscle tone.  Skin: Warm, dry, intact no rashes, lesions, ulcers. No induration Neurologic: Does not blink consistently to threat. No nystagmus. Slow speech with latency. Sensation intact to touch and withdraws from painful stimuli.  patella DTR +1 bilaterally. Strength 3/5 bilaterally. Slow movement of extremities. No tremor. No pronator drift.     Labs on Admission: I have personally reviewed following labs and imaging studies  CBC: Recent Labs  Lab 02/11/21 0150 02/11/21 0204  WBC 7.8  --   NEUTROABS 6.5  --   HGB 10.9* 11.9*  HCT 36.0 35.0*  MCV 92.8  --   PLT 151  --     Basic Metabolic Panel: Recent Labs  Lab 02/11/21 0150 02/11/21 0204  NA 138 139  K 4.6 4.7  CL 107 108  CO2 20*  --   GLUCOSE 253* 246*  BUN 20 28*  CREATININE 2.58* 2.60*  CALCIUM 8.5*  --     GFR: Estimated Creatinine Clearance: 20.2 mL/min (A) (by C-G formula based on SCr of 2.6 mg/dL (H)).  Liver Function Tests: Recent Labs  Lab 02/11/21 0150  AST 44*  ALT 24  ALKPHOS 123  BILITOT 0.7  PROT 6.3*  ALBUMIN 3.5    Urine analysis:    Component  Value Date/Time   COLORURINE YELLOW 02/11/2021 0257   APPEARANCEUR CLEAR 02/11/2021 0257   APPEARANCEUR Cloudy (A) 12/02/2019 1259   LABSPEC >1.030 (H) 02/11/2021 0257   LABSPEC 1.033 10/08/2014 0144   PHURINE 5.0 02/11/2021 0257   GLUCOSEU 100 (A) 02/11/2021 0257   GLUCOSEU >=1000 (A) 11/01/2020  [place] (A) 02/11/2021 0257   BILIRUBINUR NEGATIVE 02/11/2021 0257   BILIRUBINUR Negative 12/02/2019 1259   BILIRUBINUR Negative 10/08/2014 0144   KETONESUR 15 (A) 02/11/2021 0257   PROTEINUR 100 (A) 02/11/2021 0257   UROBILINOGEN 1.0 11/01/2020 1322   NITRITE NEGATIVE 02/11/2021 0257   LEUKOCYTESUR SMALL (A) 02/11/2021 0257   LEUKOCYTESUR Negative 10/08/2014 0144    Radiological Exams on Admission: CT HEAD CODE STROKE WO CONTRAST  Result Date: 02/11/2021 CLINICAL DATA:  Code stroke.  Acute neurologic deficit EXAM: CT HEAD WITHOUT CONTRAST TECHNIQUE: Contiguous axial images were obtained from the base of the skull through the vertex without intravenous contrast. COMPARISON:  12/12/2020 FINDINGS: Brain: There is no mass, hemorrhage or extra-axial collection. The size and configuration of the ventricles and extra-axial CSF spaces are normal. There is hypoattenuation of the periventricular white matter, most commonly indicating chronic ischemic microangiopathy. Vascular: No abnormal hyperdensity of the major intracranial arteries or dural venous sinuses. No intracranial atherosclerosis. Skull: The visualized skull base, calvarium and extracranial soft tissues are normal. Sinuses/Orbits: No fluid levels or advanced mucosal thickening of the visualized paranasal sinuses. No mastoid or middle ear effusion. The orbits are normal. ASPECTS [facility] Stroke Program Early CT Score) - Ganglionic level infarction (caudate, lentiform nuclei, internal capsule, insula, M1-M3 cortex): 7 - Supraganglionic infarction (M4-M6 cortex): 3 Total score (0-10 with 10 being normal): 10 IMPRESSION: 1. Chronic  ischemic microangiopathy without acute intracranial abnormality. 2. ASPECTS is 10. 3. These results were communicated to Dr. Kerney Elbe at 2:22 am on 02/11/2021 by text page via the [facility] messaging system. Electronically Signed   By: Ulyses Jarred M.D.   On: 02/11/2021 02:23    EKG: Independently reviewed.  EKG shows sinus tachycardia with left atrial enlargement.  No acute ST elevation or depression.  QTc 468.  Assessment/Plan Principal Problem:   Encephalopathy Ms. Coddington is admitted to medical telemetry floor.  Obtain MRI brain to evaluate for CVA, organic etiology.  Obtain EEG to evaluate for nonfocal seizure activity.  UDS is negative and alcohol level is low.  She has been evaluated by Neurology. Will check ammonia, B12 level, TSH  levels  Active Problems:   Acute renal failure superimposed on stage 3a chronic kidney disease  IVF hydration with LR at 100 ml/hr Recheck electrolytes and renal function in am Monitor I&O's    Uncontrolled type 2 diabetes mellitus  Pt had HgbA1c level 60 days ago of 9.0 so will not repeat now.  Check blood sugar q 6 hour while NPO and provide SSI as needed for glycemic control.  Resume basal insulin when diet is resumed.     Community acquired pneumonia of left lower lobe of lung Started on Rocephin and doxycycline for antibiotic coverage.     Hypotension Initially SBP was 70. BP has improved with IVF hydration.  Monitor BP. Pt not on antihypertensive medications per list in chart. Will have pharmacy verify home meds and reconcile then resume as indicated.     DVT prophylaxis: Heparin for DVT prophylaxis.  Code Status:   Full Code  Family Communication:  No family at bedside. Pt minimally responsive and unclear how much he understands of which she has been told.  Further recommendations to follow as clinically indicated Disposition Plan:   Patient is from:  Home  Anticipated DC to:  To be determined during her hospitalization  Anticipated DC  date:  Anticipate 2 midnight or more stay   Consults called:  Neurology, Dr. Cheral Marker  Admission status:  Inpatient   Yevonne Aline Abbigale Mcelhaney MD [facility]  How to contact the [facility] Attending or Consulting provider [place] or covering provider during after hours [place], for this patient?   1. Check the care team in [facility] and look for a) attending/consulting [facility] provider listed and b) the [facility] team listed 2. Log into [email or web address] and use [place]'s universal password to access. If you do not have the password, please contact the hospital operator. 3. Locate the [facility] provider you are looking for under [facility] and page to a number that you can be directly reached. 4. If you still have difficulty reaching the provider, please page the [facility] (Director on Call) for the Hospitalists listed on amion for assistance.  02/11/2021, 5:59 AM

## 2021-02-11 NOTE — Progress Notes (Addendum)
NEW ADMISSION NOTE  Arrival Method: bed Mental Orientation: oriented to self, poor historian Telemetry: yes Assessment: Completed Skin: see notes Bruising on bilateral soles of foot, and bilateral lower legs, blanchable MASD on buttocks Iv: right hand, right antecubital Pain: 10 in her abdomen and lower back, MD has been notified and no new orders have been placed at this time. Tubes: 0 Safety Measures: Safety Fall Prevention Plan has been given, discussed and signed Admission: Completed 5 [place] Orientation: Patient has been orientated to the room, unit and staff.  Family: 0  Orders have been reviewed and implemented. Will continue to monitor the patient. Call light has been placed within reach and bed alarm has been activated.  Patient is a poor historian and unable to answer admitting questions  Beatris Ship, RN

## 2021-02-11 NOTE — ED Notes (Signed)
Pt was agitated in MRI. Dr. Earnest Conroy notified and waiting for a reply.

## 2021-02-11 NOTE — Consult Note (Signed)
NEURO HOSPITALIST CONSULT NOTE   Requestig physician: Dr. Betsey Holiday  Reason for Consult: Acute mental status change  History obtained from:  EMS and Chart     HPI:                                                                                                                                          [name] is an 65 y.o. female with a PMHx of acute encephalopathy, anxiety, CKD, liver cirrhosis not due to EtOH, HTN, orthostatic hypotension, PAF (anticoagulant listed on EMS meds list but recent discharge summary from April does not list an anticoagulant), stroke with minimal left sided weakness, sepsis, syncope, DM1 and additional components to an extensive PMHx as documented below, who was at home with husband in her [place] at 1215. She is normally alert and fully oriented, ambulatory and fully independent. Husband found her at 73 in an eyes-open unresponsive state. EMS was called and on arrival she continued to be unresponsive with eyes deviated to the right. No seizure activity seen. No blood or foam from mouth. No incontinence. She began to moan when moved and was able to move both arms up to her face. She started to attempt to communicate with slurred and garbled speech - only comprehensible speech was "my back hurts". She started to gag and appeared about to vomit but did not. On arrival to the ED she appeared stuporous but then began to verbalize mostly incoherently and briefly tracked and fixated on examiner's face stating "I think I've seen you before".   Initial BP 70/palpable, HR 70's, Sats 86% on RA, CBG 300, RR 14. Afebrile and nondiaphoretic. The patient had a strong smell of EtOH on her breath.  After 500 cc IVNS, her BP increased to 120/72.   Past Medical History:  Diagnosis Date  . Acute encephalopathy 05/22/2018  . Allergy   . Anginal pain ([place])   . Anxiety   . Ascites   . C. difficile colitis 07/10/2015  . Cancer (HCC)    HX OF CANCER OF UTERUS   .  Chronic kidney disease    States she only has 1 working kidney  . Cirrhosis of liver not due to alcohol ([place]) 2016  . Degenerative disk disease   . Diverticulitis   . Gastroparesis   . GERD (gastroesophageal reflux disease)   . History of hiatal hernia   . Hypertension   . Hypothyroid   . Hypothyroidism due to amiodarone   . Ileus ([place]) 08/01/2015  . Intussusception intestine ([place]) 05/2015  . Orthostatic hypotension   . PAF (paroxysmal atrial fibrillation) ([place]) 03/2015   a. new onset 03/2015 in setting of intractable N/V; b. on Eliquis 5 mg bid; c. CHADSVASc 4 (DM, TIA x 2, female)  . Pancreatitis   .  Pneumonia 11/14/2015  . Pneumonia 07/2020  . Right ureteral stone 07/14/2016  . Sepsis ([place]) 12/15/2017  . Sick sinus syndrome ([place])   . Stomach ulcer   . Stroke [facility])    with minimal left sided weakness  . Syncope 01/2015  . Syncope due to orthostatic hypotension 05/18/2015  . Tachyarrhythmia 01/10/2016  . TIA (transient ischemic attack) 02/2015  . Type 1 diabetes ([place])    on levemir  . UTERINE CANCER, HX OF 03/27/2007   Qualifier: Diagnosis of  By: Maxie Better FNP, Rosalita Levan   . UTI (urinary tract infection) 05/22/2018    Past Surgical History:  Procedure Laterality Date  . ABDOMINAL HYSTERECTOMY    . CARDIAC CATHETERIZATION N/A 01/12/2016   Procedure: Left Heart Cath and Coronary Angiography;  Surgeon: Wellington Hampshire, MD;  Location: [facility];  Service: Cardiovascular;  Laterality: N/A;  . CHOLECYSTECTOMY    . CYSTOSCOPY W/ RETROGRADES Bilateral 12/11/2019   Procedure: CYSTOSCOPY WITH RETROGRADE PYELOGRAM;  Surgeon: Billey Co, MD;  Location: [facility];  Service: Urology;  Laterality: Bilateral;  . CYSTOSCOPY WITH BIOPSY N/A 12/11/2019   Procedure: CYSTOSCOPY WITH BLADDER BIOPSY;  Surgeon: Billey Co, MD;  Location: [facility];  Service: Urology;  Laterality: N/A;  . CYSTOSCOPY WITH FULGERATION N/A 12/11/2019   Procedure: CYSTOSCOPY WITH FULGERATION;  Surgeon:  Billey Co, MD;  Location: [facility];  Service: Urology;  Laterality: N/A;  . CYSTOSCOPY WITH STENT PLACEMENT Right 12/11/2019   Procedure: CYSTOSCOPY WITH STENT PLACEMENT;  Surgeon: Billey Co, MD;  Location: [facility];  Service: Urology;  Laterality: Right;  . CYSTOSCOPY WITH URETHRAL DILATATION Right 12/11/2019   Procedure: CYSTOSCOPY WITH URETERAL DILATATION;  Surgeon: Billey Co, MD;  Location: [facility];  Service: Urology;  Laterality: Right;  . CYSTOSCOPY/URETEROSCOPY/HOLMIUM LASER Right 07/14/2016   Procedure: CYSTOSCOPY/URETEROSCOPY/HOLMIUM LASER;  Surgeon: Alexis Frock, MD;  Location: [facility];  Service: Urology;  Laterality: Right;  . ESOPHAGOGASTRODUODENOSCOPY N/A 04/04/2015   Procedure: ESOPHAGOGASTRODUODENOSCOPY (EGD);  Surgeon: Hulen Luster, MD;  Location: [facility];  Service: Endoscopy;  Laterality: N/A;  . ESOPHAGOGASTRODUODENOSCOPY N/A 12/28/2017   Procedure: ESOPHAGOGASTRODUODENOSCOPY (EGD);  Surgeon: Lin Landsman, MD;  Location: [facility] ENDOSCOPY;  Service: Gastroenterology;  Laterality: N/A;  . ESOPHAGOGASTRODUODENOSCOPY (EGD) WITH PROPOFOL N/A 01/18/2016   Procedure: ESOPHAGOGASTRODUODENOSCOPY (EGD) WITH PROPOFOL;  Surgeon: Lucilla Lame, MD;  Location: [facility]COPY;  Service: Endoscopy;  Laterality: N/A;  . FLEXIBLE SIGMOIDOSCOPY N/A 01/18/2016   Procedure: FLEXIBLE SIGMOIDOSCOPY;  Surgeon: Lucilla Lame, MD;  Location: [facility] ENDOSCOPY;  Service: Endoscopy;  Laterality: N/A;  . HERNIA REPAIR    . URETEROSCOPY Right 12/11/2019   Procedure: Christen Butter;  Surgeon: Billey Co, MD;  Location: [facility];  Service: Urology;  Laterality: Right;    Family History  Problem Relation Age of Onset  . Hypertension Mother   . CAD Sister   . Heart attack Sister        Deceased 11-19-2014  . CAD Brother               Social History:  reports that she has quit smoking. Her smoking use included cigarettes. She has never used smokeless tobacco. She reports that  she does not drink alcohol and does not use drugs.  Allergies  Allergen Reactions  . Latex Other (See Comments)    Pt had allergic reaction to tegaderm dressing on IV.  Caused blisters on the skin.  . Aspirin Rash  . Codeine Sulfate Rash  . Erythromycin  Rash    Reaction:  Fever   . Prednisone Swelling  . Rosiglitazone Maleate Swelling  . Tetanus-Diphtheria Toxoids Td Rash and Other (See Comments)    Reaction:  Fever     MEDICATIONS:                                                                                                                     No current facility-administered medications on file prior to encounter.   Current Outpatient Medications on File Prior to Encounter  Medication Sig Dispense Refill  . insulin detemir (LEVEMIR FLEXTOUCH) 100 UNIT/ML FlexPen Inject 30 Units into the skin 2 (two) times daily. 15 mL 0  . lidocaine (LIDODERM) 5 % Place 1 patch onto the skin every 12 (twelve) hours. Remove & Discard patch within 12 hours or as directed by MD 10 patch 0  . Melatonin 10 MG CAPS Take 1 tablet by mouth at bedtime.    . sertraline (ZOLOFT) 50 MG tablet Take 1 tablet (50 mg total) by mouth daily. For anxiety and depression. 90 tablet 3     ROS:                                                                                                                                       Unable to obtain due to AMS.    There were no vitals taken for this visit.   General Examination:                                                                                                       Physical Exam  HEENT-  [place]/AT. Sores on tongue are noted.  Lungs- Respirations unlabored Extremities- No edema  Neurological Examination Mental Status: Awake with eyes open with severe confusion and disorientation. Only able to state the year after a long delay, with incorrect or incoherent responses when asked the day, month, hospital, city and state. Speech is slow with increased latencies  of verbal responses. Sparse speech output  is fluent. Speech is somewhat dysarthric in the setting of poor dentition. Was able to name a thumb and count one finger. Able to slowly follow some simple commands after frequent repetition of requests and gentle encouragement. Gradually becomes more responsive with improved ability to communicate as the assessment progresses.  Cranial Nerves: II: Does not consistently blink to threat centrally or in lateral visual fields, but is able to visually identify and object and fixate on examiner's face as well as track. PERRL.  III,IV, VI: No ptosis. Eyes conjugate. Tracks horizontally to left and right. No nystagmus.  V,VII: Smile symmetric, reacts to tactile stimulation bilaterally  VIII: hearing intact to voice IX,X: No hypophonia XI: Head is midline XII: midline tongue extension Motor: Bradykinetic in upper extremities, which remain in the position they are placed in for an extended period after passive elevation and release.  Lower extremities remain antigravity for 5 seconds after elevation.  Will resist with 4+/5 strength symmetrically in upper and lower extremities.  Sensory: Reacts to touch x 4.  Deep Tendon Reflexes: 2+ and symmetric bilateral brachioradialis, biceps and patellae.  Plantars: Right: Mute   Left: Mute Cerebellar: Slow FNF bilaterally without ataxia. Was not able to touch examiner's hand on left or right, freezing a few inches away with arms remaining in position for an extended period. Was not able to perform H-S.  Gait: Deferred   Lab Results: Basic Metabolic Panel: No results for input(s): NA, K, CL, CO2, GLUCOSE, BUN, CREATININE, CALCIUM, MG, PHOS in the last 168 hours.  CBC: No results for input(s): WBC, NEUTROABS, HGB, HCT, MCV, PLT in the last 168 hours.  Cardiac Enzymes: No results for input(s): CKTOTAL, CKMB, CKMBINDEX, TROPONINI in the last 168 hours.  Lipid Panel: No results for input(s): CHOL, TRIG, HDL, CHOLHDL,  VLDL, LDLCALC in the last 168 hours.  Imaging: No results found.   Assessment: 65 year old female presenting to the ED after being found in an awake unresponsive state at home.  1. Exam reveals waxy rigidity, mild bradykinesia, confusion, increased latencies of verbal and motor responses, but no facial droop, aphasia, sensory loss or significant weakness. She does not blink to threat but can visually identify objects.  2. DDx includes hypoactive delirium, EtOH intoxication and possible unwitnessed first-time seizure with postictal state.  3. CT head shows no acute intracranial abnormality.   Recommendations: 1. UTOX 2. EtOH level 3. IVF 4. Metabolic encephalopathy work up to include electrolytes, CBC, LFTs, ammonia level, B12 level, thiamine level, TSH and RPR. May need HIV testing as well given mouth ulcerations seen on exam.  5. If above is unrevealing and she does not improve with IVF, obtain MRI and EEG.    Electronically signed: Dr. Kerney Elbe 02/11/2021, 1:56 AM

## 2021-02-11 NOTE — ED Notes (Signed)
Report attempted x2.

## 2021-02-11 NOTE — ED Provider Notes (Addendum)
[facility] EMERGENCY DEPARTMENT Provider Note   CSN: [id number] Arrival date & time: 02/11/21  0148  An emergency department physician performed an initial assessment on this suspected stroke patient at 0150.  History Chief Complaint  Patient presents with  . Code Stroke    [name] is a 65 y.o. female.  Patient brought to the emergency department by ambulance from home.  Code stroke has been initiated by EMS.  Patient was reportedly at her normal baseline mental status 1-1/2 hours ago.  Husband then found her in a recliner staring to the right and not responding.  EMS report that the patient was hypoxic (86%) and hypotensive (BP 70) upon their arrival.  Patient administered IV fluid bolus with improvement of blood pressure.  Patient continued to have rightward gaze preference and no verbal response until they started to load her onto their stretcher at which time she became more alert.  Patient still confused but does try to answer some simple questions and follow some commands.  Level 5 caveat due to altered mental status.  Patient is reportedly normally alert and oriented.        Past Medical History:  Diagnosis Date  . Acute encephalopathy 05/22/2018  . Allergy   . Anginal pain ([place])   . Anxiety   . Ascites   . C. difficile colitis 07/10/2015  . Cancer (HCC)    HX OF CANCER OF UTERUS   . Chronic kidney disease    States she only has 1 working kidney  . Cirrhosis of liver not due to alcohol ([place]) 2016  . Degenerative disk disease   . Diverticulitis   . Gastroparesis   . GERD (gastroesophageal reflux disease)   . History of hiatal hernia   . Hypertension   . Hypothyroid   . Hypothyroidism due to amiodarone   . Ileus ([place]) 08/01/2015  . Intussusception intestine ([place]) 05/2015  . Orthostatic hypotension   . PAF (paroxysmal atrial fibrillation) ([place]) 03/2015   a. new onset 03/2015 in setting of intractable N/V; b. on Eliquis 5 mg bid; c. CHADSVASc  4 (DM, TIA x 2, female)  . Pancreatitis   . Pneumonia 11/14/2015  . Pneumonia 07/2020  . Right ureteral stone 07/14/2016  . Sepsis ([place]) 12/15/2017  . Sick sinus syndrome ([place])   . Stomach ulcer   . Stroke [facility])    with minimal left sided weakness  . Syncope 01/2015  . Syncope due to orthostatic hypotension 05/18/2015  . Tachyarrhythmia 01/10/2016  . TIA (transient ischemic attack) 02/2015  . Type 1 diabetes ([place])    on levemir  . UTERINE CANCER, HX OF 03/27/2007   Qualifier: Diagnosis of  By: Maxie Better FNP, Rosalita Levan   . UTI (urinary tract infection) 05/22/2018    Patient Active Problem List   Diagnosis Date Noted  . Cervicalgia 01/11/2021  . Cervical facet syndrome 01/11/2021  . Cervical facet arthropathy 01/11/2021  . Lumbosacral facet arthropathy 01/11/2021  . Recurrent falls while walking 01/11/2021  . Chronic low back pain (1ry area of Pain) (Bilateral) w/o sciatica 01/11/2021  . Acute encephalopathy 12/13/2020  . History of substance use disorder 12/12/2020  . Substance use disorder 12/12/2020  . Hematuria 10/27/2020  . Hemoptysis 10/27/2020  . Weakness 09/28/2020  . COVID-19 virus infection   . Stroke ([place]) 09/27/2020  . Hypoglycemia 08/27/2020  . CKD (chronic kidney disease), stage III ([place]) 08/27/2020  . Shortness of breath 07/16/2020  . [place] intertrigo 07/16/2020  . Sepsis ([place])  11/23/2019  . Acute pyelonephritis 09/30/2019  . Acute pain of left foot 08/18/2019  . Type II diabetes mellitus with renal manifestations ([place]) 08/13/2019  . Weakness of both lower extremities 07/31/2019  . Altered mental status 07/14/2019  . GAD (generalized anxiety disorder) 06/11/2019  . Poor memory 06/11/2019  . Constipation 05/25/2019  . Acute medial meniscus tear of left knee 05/25/2019  . AKI (acute kidney injury) ([place]) 05/16/2019  . Closed fracture of left distal fibula 05/16/2019  . Sinus tachycardia 05/05/2019  . Congestive dilated cardiomyopathy ([place]) 04/14/2019  . CAD  (coronary artery disease) 04/10/2019  . Closed fracture of lateral malleolus 04/10/2019  . Lumbar facet syndrome (Bilateral) (R>L) 03/09/2019  . Long term (current) use of anticoagulants 03/09/2019  . Unspecified cirrhosis of liver ([place]) 02/11/2019  . Hypoalbuminemia 02/11/2019  . Edema due to hypoalbuminemia 02/11/2019  . Lower extremity edema 01/26/2019  . Prolonged QT interval 12/17/2018  . Hypomagnesemia 12/17/2018  . Uncontrolled type 2 diabetes mellitus ([place]) 12/17/2018  . H/O TIA (transient ischemic attack) and stroke 12/17/2018  . Personal history of surgery to heart and great vessels, presenting hazards to health 12/17/2018  . DDD (degenerative disc disease), lumbar 12/17/2018  . Lumbar intervertebral disc displacement 12/17/2018  . Chronic musculoskeletal pain 12/17/2018  . Chronic generalized pain 12/17/2018  . Hyponatremia 12/17/2018  . Fibromyalgia syndrome 12/17/2018  . Other intervertebral disc displacement, lumbar region 12/17/2018  . Vitamin D deficiency 11/26/2018  . Elevated C-reactive protein (CRP) 11/26/2018  . Elevated sed rate 11/26/2018  . Chronic low back pain (1ry area of Pain) (Bilateral) (L>R) w/ sciatica (Bilateral) 11/25/2018  . Chronic lower extremity pain (2ry area of Pain) (Bilateral) (L>R) 11/25/2018  . Chronic neck pain 11/25/2018  . Pharmacologic therapy 11/25/2018  . Disorder of skeletal system 11/25/2018  . Problems influencing health status 11/25/2018  . Renal mass 10/06/2018  . Chronic intermittent abdominal pain 09/25/2018  . Unspecified abdominal pain 09/25/2018  . Frequent falls 09/12/2018  . Orthostatic hypotension 08/18/2018  . Normochromic normocytic anemia 08/18/2018  . Acute renal failure superimposed on stage 3a chronic kidney disease ([place]) 08/18/2018  . History of hypothyroidism 08/14/2018  . Medical non-compliance 08/14/2018  . Diabetic ketoacidosis ([place]) 05/22/2018  . Urinary tract infection, acute 05/22/2018  . Diarrhea   .  NSTEMI (non-ST elevated myocardial infarction) ([place]) 01/11/2016  . Tachy-brady syndrome ([place]) 12/28/2015  . PAF (paroxysmal atrial fibrillation) ([place]) 11/24/2015  . Type 2 diabetes mellitus with neurologic complication ([place]) 19/50/9326  . S/P cholecystectomy 11/23/2015  . Narcotic abuse ([place]) 11/22/2015  . Chronic superficial gastritis without bleeding 11/22/2015  . History of Clostridium difficile 11/22/2015  . History of TIA (transient ischemic attack) 11/22/2015  . Mixed hyperlipidemia 11/22/2015  . Diverticulosis 11/22/2015  . History of uterine cancer 11/22/2015  . Hypothyroidism, unspecified 11/22/2015  . Intractable cyclical vomiting with nausea 11/22/2015  . Community acquired pneumonia of left lower lobe of lung 11/14/2015  . Narcotic withdrawal ([place]) 11/11/2015  . Hyperlipidemia 08/31/2015  . Hypotension 08/06/2015  . Cryptogenic cirrhosis ([place]) 07/10/2015  . Atrial fibrillation ([place]) 07/10/2015  . Malnutrition of moderate degree 07/04/2015  . Symptomatic bradycardia 05/27/2015  . Chronic anemia 05/19/2015  . Thrombocytopenia ([employer]) 05/19/2015  . Hypokalemia 04/06/2015  . Hyperlipidemia with target LDL less than 100 04/06/2015  . Syncope 03/11/2015  . CVA (cerebral vascular accident) ([place]) 02/15/2015  . Essential hypertension 01/12/2015  . Chronically on opiate therapy 01/12/2015  . Cirrhosis of liver not due to alcohol ([place]) 2016  .  S/P Nissen fundoplication (without gastrostomy tube) procedure 05/11/2014  . Dysphagia 04/19/2014  . Myofascial pain syndrome 06/27/2007  . Chronic pain syndrome 03/28/2007  . GERD 03/27/2007  . Diverticulosis of large intestine without perforation or abscess without bleeding 03/27/2007  . Proteinuria 03/27/2007    Past Surgical History:  Procedure Laterality Date  . ABDOMINAL HYSTERECTOMY    . CARDIAC CATHETERIZATION N/A 01/12/2016   Procedure: Left Heart Cath and Coronary Angiography;  Surgeon: Wellington Hampshire, MD;  Location: [facility];  Service: Cardiovascular;  Laterality: N/A;  . CHOLECYSTECTOMY    . CYSTOSCOPY W/ RETROGRADES Bilateral 12/11/2019   Procedure: CYSTOSCOPY WITH RETROGRADE PYELOGRAM;  Surgeon: Billey Co, MD;  Location: [facility] ORS;  Service: Urology;  Laterality: Bilateral;  . CYSTOSCOPY WITH BIOPSY N/A 12/11/2019   Procedure: CYSTOSCOPY WITH BLADDER BIOPSY;  Surgeon: Billey Co, MD;  Location: [facility] ORS;  Service: Urology;  Laterality: N/A;  . CYSTOSCOPY WITH FULGERATION N/A 12/11/2019   Procedure: CYSTOSCOPY WITH FULGERATION;  Surgeon: Billey Co, MD;  Location: [facility] ORS;  Service: Urology;  Laterality: N/A;  . CYSTOSCOPY WITH STENT PLACEMENT Right 12/11/2019   Procedure: CYSTOSCOPY WITH STENT PLACEMENT;  Surgeon: Billey Co, MD;  Location: [facility] ORS;  Service: Urology;  Laterality: Right;  . CYSTOSCOPY WITH URETHRAL DILATATION Right 12/11/2019   Procedure: CYSTOSCOPY WITH URETERAL DILATATION;  Surgeon: Billey Co, MD;  Location: [facility] ORS;  Service: Urology;  Laterality: Right;  . CYSTOSCOPY/URETEROSCOPY/HOLMIUM LASER Right 07/14/2016   Procedure: CYSTOSCOPY/URETEROSCOPY/HOLMIUM LASER;  Surgeon: Alexis Frock, MD;  Location: [facility] OR[facility];  Service: Urology;  Laterality: Right;  . ESOPHAGOGASTRODUODENOSCOPY N/A 04/04/2015   Procedure: ESOPHAGOGASTRODUODENOSCOPY (EGD);  Surgeon: Hulen Luster, MD;  Location[facility] ENDOSCOPY;  Service: Endoscopy;  Laterality: N/A;  . ESOPHAGOGASTRODUODENOSCOPY N/A 12/28/2017   Procedure: ESOPHAGOGASTRODUODENOSCOPY (EGD);  Surgeon: Lin Landsman, MD;  Location: [facility] ENDOSCOPY;  Service: Gastroenterology;  Laterality: N/A;  . ESOPHAGOGASTRODUODENOSCOPY (EGD) WITH PROPOFOL N/A 01/18/2016   Procedure: ESOPHAGOGASTRODUODENOSCOPY (EGD) WITH PROPOFOL;  Surgeon: Lucilla Lame, MD;  Location: [facility] ENDOSCOPY;  Service: Endoscopy;  Laterality: N/A;  . FLEXIBLE SIGMOIDOSCOPY N/A 01/18/2016   Procedure: FLEXIBLE SIGMOIDOSCOPY;  Surgeon: Lucilla Lame, MD;  Location: [facility]  ENDOSCOPY;  Service: Endoscopy;  Laterality: N/A;  . HERNIA REPAIR    . URETEROSCOPY Right 12/11/2019   Procedure: Christen Butter;  Surgeon: Billey Co, MD;  Location: [facility] ORS;  Service: Urology;  Laterality: Right;     OB History    Gravida  1   Para  1   Term  1   Preterm      AB      Living        SAB      IAB      Ectopic      Multiple      Live Births              Family History  Problem Relation Age of Onset  . Hypertension Mother   . CAD Sister   . Heart attack Sister        Deceased 11-13-2014  . CAD Brother     Social History   Tobacco Use  . Smoking status: Former Smoker    Types: Cigarettes  . Smokeless tobacco: Never Used  . Tobacco comment: 25 years ago and only smoked occasionally  Vaping Use  . Vaping Use: Never used  Substance Use Topics  . Alcohol use: No  . Drug use: No  Home Medications Prior to Admission medications   Medication Sig Start Date End Date Taking? Authorizing Provider  insulin detemir (LEVEMIR FLEXTOUCH) 100 UNIT/ML FlexPen Inject 30 Units into the skin 2 (two) times daily. 02/10/21   [name], NP  lidocaine (LIDODERM) 5 % Place 1 patch onto the skin every 12 (twelve) hours. Remove & Discard patch within 12 hours or as directed by MD 11/09/20 11/09/21  Blake Divine, MD  Melatonin 10 MG CAPS Take 1 tablet by mouth at bedtime.    [provider]  sertraline (ZOLOFT) 50 MG tablet Take 1 tablet (50 mg total) by mouth daily. For anxiety and depression. 10/27/20   [name], NP    Allergies    Latex, Aspirin, Codeine sulfate, Erythromycin, Prednisone, Rosiglitazone maleate, and Tetanus-diphtheria toxoids td  Review of Systems   Review of Systems  Unable to perform ROS: Mental status change    Physical Exam Updated Vital Signs BP 126/76   Pulse 99   Temp (!) 97.5 F (36.4 C) (Oral)   Resp 14   Wt 73.4 kg   SpO2 100%   BMI 28.66 kg/m   Physical Exam Vitals and nursing  note reviewed.  Constitutional:      General: She is in acute distress.     Appearance: She is well-developed.  HENT:     Head: Normocephalic and atraumatic.     Comments: Aphthous ulcer on side of tongue but no evidence of abrasion, laceration or biting of tongue.  No herpetic lesions.    Right Ear: Hearing normal.     Left Ear: Hearing normal.     Nose: Nose normal.  Eyes:     Conjunctiva/sclera: Conjunctivae normal.     Pupils: Pupils are equal, round, and reactive to light.  Cardiovascular:     Rate and Rhythm: Regular rhythm.     Heart sounds: S1 normal and S2 normal. No murmur heard. No friction rub. No gallop.   Pulmonary:     Effort: Pulmonary effort is normal. No respiratory distress.     Breath sounds: Normal breath sounds.  Chest:     Chest wall: No tenderness.  Abdominal:     General: Bowel sounds are normal.     Palpations: Abdomen is soft.     Tenderness: There is no abdominal tenderness. There is no guarding or rebound. Negative signs include Murphy's sign and McBurney's sign.     Hernia: No hernia is present.  Musculoskeletal:        General: Normal range of motion.     Cervical back: Normal range of motion and neck supple.  Skin:    General: Skin is warm and dry.     Findings: No rash.  Neurological:     Mental Status: She is alert. She is confused.     GCS: GCS eye subscore is 4. GCS verbal subscore is 4. GCS motor subscore is 6.     Cranial Nerves: No cranial nerve deficit.     Sensory: No sensory deficit.     Coordination: Coordination normal.     Comments: Obviously confused, seems to have difficulty understanding questions but does answer simple questions.  Intermittently will follow simple commands  Psychiatric:        Speech: Speech is slurred.     ED Results / Procedures / Treatments   Labs (all labs ordered are listed, but only abnormal results are displayed) Labs Reviewed  CBC - Abnormal; Notable for the following components:  Result  Value   Hemoglobin 10.9 (*)    RDW 15.9 (*)    All other components within normal limits  COMPREHENSIVE METABOLIC PANEL - Abnormal; Notable for the following components:   CO2 20 (*)    Glucose, Bld 253 (*)    Creatinine, Ser 2.58 (*)    Calcium 8.5 (*)    Total Protein 6.3 (*)    AST 44 (*)    GFR, Estimated 20 (*)    All other components within normal limits  URINALYSIS, ROUTINE W REFLEX MICROSCOPIC - Abnormal; Notable for the following components:   Specific Gravity, Urine >1.030 (*)    Glucose, UA 100 (*)    Hgb urine dipstick MODERATE (*)    Ketones, ur 15 (*)    Protein, ur 100 (*)    Leukocytes,Ua SMALL (*)    All other components within normal limits  URINALYSIS, MICROSCOPIC (REFLEX) - Abnormal; Notable for the following components:   Bacteria, UA RARE (*)    All other components within normal limits  CBG MONITORING, ED - Abnormal; Notable for the following components:   Glucose-Capillary 175 (*)    All other components within normal limits  I-STAT CHEM 8, ED - Abnormal; Notable for the following components:   BUN 28 (*)    Creatinine, Ser 2.60 (*)    Glucose, Bld 246 (*)    Calcium, Ion 1.00 (*)    Hemoglobin 11.9 (*)    HCT 35.0 (*)    All other components within normal limits  SARS CORONAVIRUS 2 (TAT 6-24 HRS)  ETHANOL  PROTIME-INR  APTT  DIFFERENTIAL  RAPID URINE DRUG SCREEN, HOSP PERFORMED    EKG EKG Interpretation  Date/Time:  Saturday February 11 2021 02:15:24 EDT Ventricular Rate:  105 PR Interval:  184 QRS Duration: 90 QT Interval:  354 QTC Calculation: 468 R Axis:   66 Text Interpretation: Sinus tachycardia Consider left atrial enlargement Anteroseptal infarct, old Artifact in lead(s) I II III aVR aVL aVF V3 V4 V5 Confirmed by Orpah Greek [id number]) on 02/11/2021 2:24:14 AM   Radiology CT HEAD CODE STROKE WO CONTRAST  Result Date: 02/11/2021 CLINICAL DATA:  Code stroke.  Acute neurologic deficit EXAM: CT HEAD WITHOUT CONTRAST TECHNIQUE:  Contiguous axial images were obtained from the base of the skull through the vertex without intravenous contrast. COMPARISON:  12/12/2020 FINDINGS: Brain: There is no mass, hemorrhage or extra-axial collection. The size and configuration of the ventricles and extra-axial CSF spaces are normal. There is hypoattenuation of the periventricular white matter, most commonly indicating chronic ischemic microangiopathy. Vascular: No abnormal hyperdensity of the major intracranial arteries or dural venous sinuses. No intracranial atherosclerosis. Skull: The visualized skull base, calvarium and extracranial soft tissues are normal. Sinuses/Orbits: No fluid levels or advanced mucosal thickening of the visualized paranasal sinuses. No mastoid or middle ear effusion. The orbits are normal. ASPECTS [facility] Stroke Program Early CT Score) - Ganglionic level infarction (caudate, lentiform nuclei, internal capsule, insula, M1-M3 cortex): 7 - Supraganglionic infarction (M4-M6 cortex): 3 Total score (0-10 with 10 being normal): 10 IMPRESSION: 1. Chronic ischemic microangiopathy without acute intracranial abnormality. 2. ASPECTS is 10. 3. These results were communicated to Dr. Kerney Elbe at 2:22 am on 02/11/2021 by text page via the [facility] messaging system. Electronically Signed   By: Ulyses Jarred M.D.   On: 02/11/2021 02:23    Procedures Procedures   Medications Ordered in ED Medications  sodium chloride 0.9 % bolus 1,000 mL (1,000 mLs Intravenous New Bag/Given  02/11/21 0351)    ED Course  I have reviewed the triage vital signs and the nursing notes.  Pertinent labs & imaging results that were available during my care of the patient were reviewed by me and considered in my medical decision making (see chart for details).    MDM Rules/Calculators/A&P                          Patient brought to the emergency department as a code stroke.  She was found by her husband with her eyes open, looking to the right and not  responding.  EMS report that she continued this behavior upon their arrival, was also initially hypoxic and hypotensive.  This improved with supplemental oxygen and IV fluids.  At arrival to the ER she is alert but confused.  Findings are nonfocal, however.  Screening CT does not show any acute abnormality.  Patient evaluated by Dr. Cheral Marker, neurology.  He did not feel further stroke work-up was necessary.  Recommends metabolic work-up, if no improvement by morning, consider EEG.  Chest x-ray with atelectasis versus patchy infiltrate at the left base.  Add Rocephin.  Has a macrolide allergy.  Addendum: Alcohol negative, drug screen negative.  Discussed with Dr. Cheral Marker and Dr. Tonie Griffith -next step would be MRI and EEG.  Consider LP if no answer, but at this time nothing to indicate meningitis.  Final Clinical Impression(s) / ED Diagnoses Final diagnoses:  Encephalopathy  AKI (acute kidney injury) [facility])    Rx / DC Orders ED Discharge Orders    None       Mayeli Bornhorst, Gwenyth Allegra, MD 02/11/21 2620    Orpah Greek, MD 02/11/21 3559    Orpah Greek, MD 02/11/21 [phone]

## 2021-02-11 NOTE — Evaluation (Signed)
Clinical/Bedside Swallow Evaluation Patient Details  Name: [name] MRN: [id number] Date of Birth: 05-Sep-1956  Today's Date: 02/11/2021 Time: SLP Start Time (ACUTE ONLY): 1805 SLP Stop Time (ACUTE ONLY): 1815 SLP Time Calculation (min) (ACUTE ONLY): 10 min  Past Medical History:  Past Medical History:  Diagnosis Date  . Acute encephalopathy 05/22/2018  . Allergy   . Anginal pain ([place])   . Anxiety   . Ascites   . C. difficile colitis 07/10/2015  . Cancer (HCC)    HX OF CANCER OF UTERUS   . Chronic kidney disease    States she only has 1 working kidney  . Cirrhosis of liver not due to alcohol ([place]) 2016  . Degenerative disk disease   . Diverticulitis   . Gastroparesis   . GERD (gastroesophageal reflux disease)   . History of hiatal hernia   . Hypertension   . Hypothyroid   . Hypothyroidism due to amiodarone   . Ileus ([place]) 08/01/2015  . Intussusception intestine ([place]) 05/2015  . Orthostatic hypotension   . PAF (paroxysmal atrial fibrillation) ([place]) 03/2015   a. new onset 03/2015 in setting of intractable N/V; b. on Eliquis 5 mg bid; c. CHADSVASc 4 (DM, TIA x 2, female)  . Pancreatitis   . Pneumonia 11/14/2015  . Pneumonia 07/2020  . Right ureteral stone 07/14/2016  . Sepsis ([place]) 12/15/2017  . Sick sinus syndrome ([place])   . Stomach ulcer   . Stroke [facility])    with minimal left sided weakness  . Syncope 01/2015  . Syncope due to orthostatic hypotension 05/18/2015  . Tachyarrhythmia 01/10/2016  . TIA (transient ischemic attack) 02/2015  . Type 1 diabetes ([place])    on levemir  . UTERINE CANCER, HX OF 03/27/2007   Qualifier: Diagnosis of  By: Maxie Better FNP, Rosalita Levan   . UTI (urinary tract infection) 05/22/2018   Past Surgical History:  Past Surgical History:  Procedure Laterality Date  . ABDOMINAL HYSTERECTOMY    . CARDIAC CATHETERIZATION N/A 01/12/2016   Procedure: Left Heart Cath and Coronary Angiography;  Surgeon: Wellington Hampshire, MD;  Location: [facility];   Service: Cardiovascular;  Laterality: N/A;  . CHOLECYSTECTOMY    . CYSTOSCOPY W/ RETROGRADES Bilateral 12/11/2019   Procedure: CYSTOSCOPY WITH RETROGRADE PYELOGRAM;  Surgeon: Billey Co, MD;  Location: [facility] OR[facility];  Service: Urology;  Laterality: Bilateral;  . CYSTOSCOPY WITH BIOPSY N/A 12/11/2019   Procedure: CYSTOSCOPY WITH BLADDER BIOPSY;  Surgeon: Billey Co, MD;  Location: [facility];  Service: Urology;  Laterality: N/A;  . CYSTOSCOPY WITH FULGERATION N/A 12/11/2019   Procedure: CYSTOSCOPY WITH FULGERATION;  Surgeon: Billey Co, MD;  Location: [facility];  Service: Urology;  Laterality: N/A;  . CYSTOSCOPY WITH STENT PLACEMENT Right 12/11/2019   Procedure: CYSTOSCOPY WITH STENT PLACEMENT;  Surgeon: Billey Co, MD;  Location: [facility];  Service: Urology;  Laterality: Right;  . CYSTOSCOPY WITH URETHRAL DILATATION Right 12/11/2019   Procedure: CYSTOSCOPY WITH URETERAL DILATATION;  Surgeon: Billey Co, MD;  Location: [facility];  Service: Urology;  Laterality: Right;  . CYSTOSCOPY/URETEROSCOPY/HOLMIUM LASER Right 07/14/2016   Procedure: CYSTOSCOPY/URETEROSCOPY/HOLMIUM LASER;  Surgeon: Alexis Frock, MD;  Location: [facility];  Service: Urology;  Laterality: Right;  . ESOPHAGOGASTRODUODENOSCOPY N/A 04/04/2015   Procedure: ESOPHAGOGASTRODUODENOSCOPY (EGD);  Surgeon: Hulen Luster, MD;  Location: [facility] ENDOSCOPY;  Service: Endoscopy;  Laterality: N/A;  . ESOPHAGOGASTRODUODENOSCOPY N/A 12/28/2017   Procedure: ESOPHAGOGASTRODUODENOSCOPY (EGD);  Surgeon: Lin Landsman, MD;  Location: [facility];  Service: Gastroenterology;  Laterality: N/A;  . ESOPHAGOGASTRODUODENOSCOPY (EGD) WITH PROPOFOL N/A 01/18/2016   Procedure: ESOPHAGOGASTRODUODENOSCOPY (EGD) WITH PROPOFOL;  Surgeon: Lucilla Lame, MD;  Location: [facility] ENDOSCOPY;  Service: Endoscopy;  Laterality: N/A;  . FLEXIBLE SIGMOIDOSCOPY N/A 01/18/2016   Procedure: FLEXIBLE SIGMOIDOSCOPY;  Surgeon: Lucilla Lame, MD;  Location: [facility] ENDOSCOPY;   Service: Endoscopy;  Laterality: N/A;  . HERNIA REPAIR    . URETEROSCOPY Right 12/11/2019   Procedure: Christen Butter;  Surgeon: Billey Co, MD;  Location: [facility];  Service: Urology;  Laterality: Right;   HPI:  Patient is a 65 y.o. female with PMH: CKD, DM-2, hypothyroidism, cirrhosis, TIA, paroxysmal a-fib not on anticoagulation, anxiety, GERD, hiatal hernia, HTN, CVA (with minimal left sided weakness) who presented to hospital via EMS for evaluation of AMS. Husband reportedly found her with eyes open but unresponsive and with rightward gaze. CT negative for acute pathology. CXR revealed Patchy left lower lobe opacities and possible trace left pleural effusion. Finding may represent atelectasis versus  infection/inflammation. SLP evaluation ordered for bedside swallow eval as well as ?slurring of speech.   Assessment / Plan / Recommendation Clinical Impression  Patient presents with a mild oropharyngeal dysphagia that is without overt s/s aspiration or penetration. SLP observed mild questionable delayed swallow initiation and discoordination of swallow however during subsequent straw sips of thin liquids, this was not observed. Puree solids were tolerated without difficulty and without oral residuals post swallows. SLP did not try any advanced solids secondary to patient's current inconsistent function with cognition. She did state that although she has no teeth and no dentures, the only thing she can't chew is nuts". SLP to briefly follow for toleration of PO's and ability to trial advanced solids. SLP Visit Diagnosis: Dysphagia, oropharyngeal phase (R13.12)    Aspiration Risk  Mild aspiration risk    Diet Recommendation Dysphagia 1 (Puree);Thin liquid   Liquid Administration via: Cup;Straw Medication Administration: Whole meds with puree Supervision: Full supervision/cueing for compensatory strategies;Staff to assist with self feeding Compensations: Minimize environmental  distractions;Slow rate;Small sips/bites Postural Changes: Seated upright at 90 degrees;Remain upright for at least 30 minutes after po intake    Other  Recommendations Oral Care Recommendations: Oral care BID;Staff/trained caregiver to provide oral care   Follow up Recommendations None      Frequency and Duration min 1 x/week  1 week       Prognosis Prognosis for Safe Diet Advancement: Fair Barriers to Reach Goals: Severity of deficits;Other (Comment) Barriers/Prognosis Comment: patient edentulous      Swallow Study   General Date of Onset: 02/11/21 HPI: Patient is a 65 y.o. female with PMH: CKD, DM-2, hypothyroidism, cirrhosis, TIA, paroxysmal a-fib not on anticoagulation, anxiety, GERD, hiatal hernia, HTN, CVA (with minimal left sided weakness) who presented to hospital via EMS for evaluation of AMS. Husband reportedly found her with eyes open but unresponsive and with rightward gaze. CT negative for acute pathology. CXR revealed Patchy left lower lobe opacities and possible trace left pleural effusion. Finding may represent atelectasis versus  infection/inflammation. SLP evaluation ordered for bedside swallow eval as well as ?slurring of speech. Type of Study: Bedside Swallow Evaluation Previous Swallow Assessment: during previous stay, April 2022 Diet Prior to this Study: Dysphagia 1 (puree);Thin liquids Temperature Spikes Noted: No Respiratory Status: Nasal cannula History of Recent Intubation: No Behavior/Cognition: Alert;Cooperative;Confused Oral Cavity Assessment: Within Functional Limits Oral Care Completed by SLP: Yes Oral Cavity - Dentition: Edentulous Self-Feeding Abilities: Needs assist;Total assist Patient Positioning: Upright in bed Baseline  Vocal Quality: Low vocal intensity Volitional Cough: Cognitively unable to elicit Volitional Swallow: Unable to elicit    Oral/Motor/Sensory Function Overall Oral Motor/Sensory Function: Within functional limits   Ice Chips      Thin Liquid Thin Liquid: Impaired Presentation: Straw Pharyngeal  Phase Impairments: Other (comments) Other Comments: initially appeared with suspected delayed and somewhat discoordinated swallow, but subsequent swallows appeared [facility]    Nectar Thick     Honey Thick     Puree Puree: Within functional limits Presentation: [place], MA, [facility]

## 2021-02-11 NOTE — ED Notes (Signed)
Report given to [facility] on 99M.

## 2021-02-11 NOTE — Progress Notes (Signed)
Admitted this morning. 65 y.o. female with medical history significant for CKD, DMT2, hypothyroidism, Cirrhosis, TIA, Paroxysmal A-fib not on anticoagulation presents with AMS/unresponsiveness/rightward gaze.Initially she was found to have low systolic BP by EMS with SBP in the 70's->improved with IV hydration in ED. CT head -ve, AKI Cr 1.3->2.6. U/A nl. Chest x-ray reveals left lower lobe infiltrate versus atelectasis.  Patient was given Rocephin, doxy. Admitted for further stroke w/u, EEG.UDS is negative and alcohol level is low. IVF hydration with LR at 100 ml/hr with Acute renal failure superimposed on stage 3a chronic kidney disease .Seen by Neurology.  Ammonia level elevated at 50, B12 341, TSH slightly up at 4.5.  When seen in rounds, patient just returned from MRI-they were unable to complete due to patient moving a lot.  She does appear to have replicative speech,?  Slurred which seems to be intermittent and less when distracted.  She has poor right hand grip and decreased mobility in the right lower extremity against gravity. Will obtain MRI with presedation and add lactulose for elevated ammonia level.  We will follow-up neurology recommendations.

## 2021-02-11 NOTE — Progress Notes (Signed)
Pt brought for MRI. While attempting to change, pt started getting agitated, moving head, arms, breathing quickened and got louder. Pt sent back to room.

## 2021-02-11 NOTE — Plan of Care (Signed)
  Problem: Clinical Measurements: Goal: Diagnostic test results will improve Outcome: Completed/Met

## 2021-02-11 NOTE — Care Plan (Signed)
Exam much improved from Dr. Yvetta Coder exam earlier in the day.  Continues to have incorrect responses to orientation questions, including month, year though interestingly she reports she remembers passing out in a chair.  Easily follows simple commands.  She is pleasant and cooperative and very interactive, though her speech is often quite stuttering and she does at times have word finding difficulties  On cranial nerve testing, does not correctly report fingers held in different visual fields but does orient to them.  Reports double vision when looking out of the left eye only.  Grossly however her external ocular movements are intact without nystagmus.  Face is symmetric, hearing is intact, voice is normal  She does not have any pronator drift testing, and sensation appears grossly equal in all 4 extremities  Resulted labs to date notable for ammonia elevation to 50, acute kidney injury with creatinine of 2.6, mild AST elevation of 44, CBC notable for mild normocytic anemia with hemoglobin of 10.9 with elevated RDW of 15.9 B12 level 341 TSH elevated mildly at 4.528 Ethanol on admission negative UDS negative, UA negative for nitrates, small leukocytes, moderate hemoglobin, mild glucosuria and + ketones Blood cultures pending Thiamine level, RPR, HIV pending MRI brain pending as patient was too agitated initially  Impression remains of a metabolic encephalopathy which is clearing, perhaps related to polypharmacy and AKI, but will follow-up work-up including MRI and EEG.

## 2021-02-11 NOTE — Evaluation (Signed)
Speech Language Pathology Evaluation Patient Details Name: BEULAH CAPOBIANCO MRN: [id number] DOB: [birth year] Today's Date: 02/11/2021 Time: 1750-1805 SLP Time Calculation (min) (ACUTE ONLY): 15 min  Problem List:  Patient Active Problem List   Diagnosis Date Noted  . Encephalopathy 02/11/2021  . Cervicalgia 01/11/2021  . Cervical facet syndrome 01/11/2021  . Cervical facet arthropathy 01/11/2021  . Lumbosacral facet arthropathy 01/11/2021  . Recurrent falls while walking 01/11/2021  . Chronic low back pain (1ry area of Pain) (Bilateral) w/o sciatica 01/11/2021  . Acute encephalopathy 12/13/2020  . History of substance use disorder 12/12/2020  . Substance use disorder 12/12/2020  . Hematuria 10/27/2020  . Hemoptysis 10/27/2020  . Weakness 09/28/2020  . COVID-19 virus infection   . Stroke ([place]) 09/27/2020  . Hypoglycemia 08/27/2020  . CKD (chronic kidney disease), stage III ([place]) 08/27/2020  . Shortness of breath 07/16/2020  . [place] intertrigo 07/16/2020  . Sepsis ([place]) 11/23/2019  . Acute pyelonephritis 09/30/2019  . Acute pain of left foot 08/18/2019  . Type II diabetes mellitus with renal manifestations ([place]) 08/13/2019  . Weakness of both lower extremities 07/31/2019  . Altered mental status 07/14/2019  . GAD (generalized anxiety disorder) 06/11/2019  . Poor memory 06/11/2019  . Constipation 05/25/2019  . Acute medial meniscus tear of left knee 05/25/2019  . AKI (acute kidney injury) ([place]) 05/16/2019  . Closed fracture of left distal fibula 05/16/2019  . Sinus tachycardia 05/05/2019  . Congestive dilated cardiomyopathy ([place]) 04/14/2019  . CAD (coronary artery disease) 04/10/2019  . Closed fracture of lateral malleolus 04/10/2019  . Lumbar facet syndrome (Bilateral) (R>L) 03/09/2019  . Long term (current) use of anticoagulants 03/09/2019  . Unspecified cirrhosis of liver ([place]) 02/11/2019  . Hypoalbuminemia 02/11/2019  . Edema due to hypoalbuminemia 02/11/2019  .  Lower extremity edema 01/26/2019  . Prolonged QT interval 12/17/2018  . Hypomagnesemia 12/17/2018  . Uncontrolled type 2 diabetes mellitus ([place]) 12/17/2018  . H/O TIA (transient ischemic attack) and stroke 12/17/2018  . Personal history of surgery to heart and great vessels, presenting hazards to health 12/17/2018  . DDD (degenerative disc disease), lumbar 12/17/2018  . Lumbar intervertebral disc displacement 12/17/2018  . Chronic musculoskeletal pain 12/17/2018  . Chronic generalized pain 12/17/2018  . Hyponatremia 12/17/2018  . Fibromyalgia syndrome 12/17/2018  . Other intervertebral disc displacement, lumbar region 12/17/2018  . Vitamin D deficiency 11/26/2018  . Elevated C-reactive protein (CRP) 11/26/2018  . Elevated sed rate 11/26/2018  . Chronic low back pain (1ry area of Pain) (Bilateral) (L>R) w/ sciatica (Bilateral) 11/25/2018  . Chronic lower extremity pain (2ry area of Pain) (Bilateral) (L>R) 11/25/2018  . Chronic neck pain 11/25/2018  . Pharmacologic therapy 11/25/2018  . Disorder of skeletal system 11/25/2018  . Problems influencing health status 11/25/2018  . Renal mass 10/06/2018  . Chronic intermittent abdominal pain 09/25/2018  . Unspecified abdominal pain 09/25/2018  . Frequent falls 09/12/2018  . Orthostatic hypotension 08/18/2018  . Normochromic normocytic anemia 08/18/2018  . Acute renal failure superimposed on stage 3a chronic kidney disease ([place]) 08/18/2018  . History of hypothyroidism 08/14/2018  . Medical non-compliance 08/14/2018  . Diabetic ketoacidosis ([place]) 05/22/2018  . Urinary tract infection, acute 05/22/2018  . Diarrhea   . NSTEMI (non-ST elevated myocardial infarction) ([place]) 01/11/2016  . Tachy-brady syndrome ([place]) 12/28/2015  . PAF (paroxysmal atrial fibrillation) ([place]) 11/24/2015  . Type 2 diabetes mellitus with neurologic complication ([place]) 80/11/4915  . S/P cholecystectomy 11/23/2015  . Narcotic abuse ([place]) 11/22/2015  . Chronic  superficial gastritis  without bleeding 11/22/2015  . History of Clostridium difficile 11/22/2015  . History of TIA (transient ischemic attack) 11/22/2015  . Mixed hyperlipidemia 11/22/2015  . Diverticulosis 11/22/2015  . History of uterine cancer 11/22/2015  . Hypothyroidism, unspecified 11/22/2015  . Intractable cyclical vomiting with nausea 11/22/2015  . Community acquired pneumonia of left lower lobe of lung 11/14/2015  . Narcotic withdrawal ([place]) 11/11/2015  . Hyperlipidemia 08/31/2015  . Hypotension 08/06/2015  . Cryptogenic cirrhosis ([place]) 07/10/2015  . Atrial fibrillation ([place]) 07/10/2015  . Malnutrition of moderate degree 07/04/2015  . Symptomatic bradycardia 05/27/2015  . Chronic anemia 05/19/2015  . Thrombocytopenia ([place]) 05/19/2015  . Hypokalemia 04/06/2015  . Hyperlipidemia with target LDL less than 100 04/06/2015  . Syncope 03/11/2015  . CVA (cerebral vascular accident) ([place]) 02/15/2015  . Essential hypertension 01/12/2015  . Chronically on opiate therapy 01/12/2015  . Cirrhosis of liver not due to alcohol ([place]) 2016  . S/P Nissen fundoplication (without gastrostomy tube) procedure 05/11/2014  . Dysphagia 04/19/2014  . Myofascial pain syndrome 06/27/2007  . Chronic pain syndrome 03/28/2007  . GERD 03/27/2007  . Diverticulosis of large intestine without perforation or abscess without bleeding 03/27/2007  . Proteinuria 03/27/2007   Past Medical History:  Past Medical History:  Diagnosis Date  . Acute encephalopathy 05/22/2018  . Allergy   . Anginal pain ([place])   . Anxiety   . Ascites   . C. difficile colitis 07/10/2015  . Cancer (HCC)    HX OF CANCER OF UTERUS   . Chronic kidney disease    States she only has 1 working kidney  . Cirrhosis of liver not due to alcohol ([place]) 2016  . Degenerative disk disease   . Diverticulitis   . Gastroparesis   . GERD (gastroesophageal reflux disease)   . History of hiatal hernia   . Hypertension   . Hypothyroid   .  Hypothyroidism due to amiodarone   . Ileus ([place]) 08/01/2015  . Intussusception intestine ([place]) 05/2015  . Orthostatic hypotension   . PAF (paroxysmal atrial fibrillation) ([place]) 03/2015   a. new onset 03/2015 in setting of intractable N/V; b. on Eliquis 5 mg bid; c. CHADSVASc 4 (DM, TIA x 2, female)  . Pancreatitis   . Pneumonia 11/14/2015  . Pneumonia 07/2020  . Right ureteral stone 07/14/2016  . Sepsis ([place]) 12/15/2017  . Sick sinus syndrome ([place])   . Stomach ulcer   . Stroke [facility])    with minimal left sided weakness  . Syncope 01/2015  . Syncope due to orthostatic hypotension 05/18/2015  . Tachyarrhythmia 01/10/2016  . TIA (transient ischemic attack) 02/2015  . Type 1 diabetes ([place])    on levemir  . UTERINE CANCER, HX OF 03/27/2007   Qualifier: Diagnosis of  By: Maxie Better FNP, Rosalita Levan   . UTI (urinary tract infection) 05/22/2018   Past Surgical History:  Past Surgical History:  Procedure Laterality Date  . ABDOMINAL HYSTERECTOMY    . CARDIAC CATHETERIZATION N/A 01/12/2016   Procedure: Left Heart Cath and Coronary Angiography;  Surgeon: Wellington Hampshire, MD;  Location: [place] [facility];  Service: Cardiovascular;  Laterality: N/A;  . CHOLECYSTECTOMY    . CYSTOSCOPY W/ RETROGRADES Bilateral 12/11/2019   Procedure: CYSTOSCOPY WITH RETROGRADE PYELOGRAM;  Surgeon: Billey Co, MD;  Location: [facility] ORS;  Service: Urology;  Laterality: Bilateral;  . CYSTOSCOPY WITH BIOPSY N/A 12/11/2019   Procedure: CYSTOSCOPY WITH BLADDER BIOPSY;  Surgeon: Billey Co, MD;  Location: [facility];  Service: Urology;  Laterality:  N/A;  . CYSTOSCOPY WITH FULGERATION N/A 12/11/2019   Procedure: CYSTOSCOPY WITH FULGERATION;  Surgeon: Billey Co, MD;  Location: [facility];  Service: Urology;  Laterality: N/A;  . CYSTOSCOPY WITH STENT PLACEMENT Right 12/11/2019   Procedure: CYSTOSCOPY WITH STENT PLACEMENT;  Surgeon: Billey Co, MD;  Location: [facility];  Service: Urology;  Laterality: Right;  .  CYSTOSCOPY WITH URETHRAL DILATATION Right 12/11/2019   Procedure: CYSTOSCOPY WITH URETERAL DILATATION;  Surgeon: Billey Co, MD;  Location: [facility] OR[facility];  Service: Urology;  Laterality: Right;  . CYSTOSCOPY/URETEROSCOPY/HOLMIUM LASER Right 07/14/2016   Procedure: CYSTOSCOPY/URETEROSCOPY/HOLMIUM LASER;  Surgeon: Alexis Frock, MD;  Location: [facility];  Service: Urology;  Laterality: Right;  . ESOPHAGOGASTRODUODENOSCOPY N/A 04/04/2015   Procedure: ESOPHAGOGASTRODUODENOSCOPY (EGD);  Surgeon: Hulen Luster, MD;  Location[facility];  Service: Endoscopy;  Laterality: N/A;  . ESOPHAGOGASTRODUODENOSCOPY N/A 12/28/2017   Procedure: ESOPHAGOGASTRODUODENOSCOPY (EGD);  Surgeon: Lin Landsman, MD;  Location:  [facility] ENDOSCOPY;  Service: Gastroenterology;  Laterality: N/A;  . ESOPHAGOGASTRODUODENOSCOPY (EGD) WITH PROPOFOL N/A 01/18/2016   Procedure: ESOPHAGOGASTRODUODENOSCOPY (EGD) WITH PROPOFOL;  Surgeon: Lucilla Lame, MD;  Location: [facility] ENDOSCOPY;  Service: Endoscopy;  Laterality: N/A;  . FLEXIBLE SIGMOIDOSCOPY N/A 01/18/2016   Procedure: FLEXIBLE SIGMOIDOSCOPY;  Surgeon: Lucilla Lame, MD;  Location: [facility] ENDOSCOPY;  Service: Endoscopy;  Laterality: N/A;  . HERNIA REPAIR    . URETEROSCOPY Right 12/11/2019   Procedure: Christen Butter;  Surgeon: Billey Co, MD;  Location: [facility] ORS;  Service: Urology;  Laterality: Right;   HPI:  Patient is a 65 y.o. female with PMH: CKD, DM-2, hypothyroidism, cirrhosis, TIA, paroxysmal a-fib not on anticoagulation, anxiety, GERD, hiatal hernia, HTN, CVA (with minimal left sided weakness) who presented to hospital via EMS for evaluation of AMS. Husband reportedly found her with eyes open but unresponsive and with rightward gaze. CT negative for acute pathology. CXR revealed Patchy left lower lobe opacities and possible trace left pleural effusion. Finding may represent atelectasis versus  infection/inflammation. SLP evaluation ordered for bedside swallow eval as well  as ?slurring of speech.   Assessment / Plan / Recommendation Clinical Impression  Patient presents with a moderate cognitive impairment and inconsistent fluency impairment. She was oriented to self only, but stated she thought she was in "[place]" and when SLP named her listed city from demographics page in chart, she did not even appear to recognize the place name at all [facility] [place]). She did recognize her husband's name however. When asked where she was she said "at home" and was not able to demonstrate any awareness to fact that she was in the hospital. Patient exhibited inconsistent episodes of hyperventilating as well as frequent episodes of dysfluencies including initial phoneme prolongation, phoneme and part word repetition, facial grimacing and appearance of "blocks" during which she was appearing to force out sounds. Of note, listed in H&P from previous hospitalization states that cognitive changes and "stuttering" in 2017. No family present to determine baseline cognitive functioning. At end of session when SLP told patient it was 03/13/23, she said "oh god, oh no" and then she reported that her sister who has passed away, her birthday was 2025-03-13. Patient then started hyperventilating but this ceased after a couple seconds and she then closed eyes and was silent as IV RN was completing needle placement.    SLP Assessment  SLP Recommendation/Assessment: Patient needs continued Speech Lanaguage Pathology Services SLP Visit Diagnosis: Cognitive communication deficit (R41.841)    Follow Up Recommendations  Other (comment);24 hour supervision/assistance (  TBD)    Frequency and Duration min 1 x/week  1 week      SLP Evaluation Cognition  Overall Cognitive Status: No family/caregiver present to determine baseline cognitive functioning Arousal/Alertness: Awake/alert Orientation Level: Oriented to person;Disoriented to situation;Disoriented to place;Disoriented to time Attention:  Sustained Sustained Attention: Impaired Sustained Attention Impairment: Verbal basic;Functional basic Memory: Impaired Memory Impairment: Decreased recall of new information Awareness: Impaired Awareness Impairment: Intellectual impairment Problem Solving: Impaired Problem Solving Impairment: Verbal basic Behaviors: Lability;Restless Safety/Judgment: Impaired       Comprehension  Auditory Comprehension Overall Auditory Comprehension: Impaired Yes/No Questions: Not tested Conversation: Simple Interfering Components: Attention;Processing speed EffectiveTechniques: Extra processing time;Repetition Visual Recognition/Discrimination Discrimination: Not tested Reading Comprehension Reading Status: Not tested    Expression Expression Primary Mode of Expression: Verbal Verbal Expression Overall Verbal Expression: Impaired Initiation: No impairment Level of Generative/Spontaneous Verbalization: Phrase Naming: Not tested Pragmatics: Impairment Impairments: Abnormal affect Non-Verbal Means of Communication: Not applicable Written Expression Dominant Hand: Right Written Expression: Not tested   Oral / Motor  Oral Motor/Sensory Function Overall Oral Motor/Sensory Function: Within functional limits Motor Speech Overall Motor Speech: Impaired Respiration: Impaired Level of Impairment: Phrase Phonation: Low vocal intensity Resonance: Within functional limits Articulation: Within functional limitis Intelligibility: Intelligible Motor Speech Errors: Unaware;Groping for words;Inconsistent (inconsistent episodes of dysfluencies)   Simpson, MA, [place] [facility]

## 2021-02-11 NOTE — ED Notes (Signed)
Pt on 2L [place].

## 2021-02-11 NOTE — ED Triage Notes (Signed)
BIB [facility] form home. LKW 0015. Husband found her in her chair at home and was awake but unresponsive. When EMS arrived pt had a fixed gaze to the right. Hypotensive 70 palpated. Pt was 86% on room air. When pt was moved into the EMS pt started moving and altered, with slurred speech. Smell on ETOH.   120/60 94%  2L [place] 70 heart rate  CBG 300.

## 2021-02-12 ENCOUNTER — Inpatient Hospital Stay (HOSPITAL_COMMUNITY): Payer: Medicare Other

## 2021-02-12 ENCOUNTER — Inpatient Hospital Stay (HOSPITAL_COMMUNITY)
Admit: 2021-02-12 | Discharge: 2021-02-12 | Disposition: A | Discharge status: Home / Self Care | Payer: Medicare Other | Attending: Neurology | Admitting: Neurology

## 2021-02-12 LAB — BLOOD CULTURE ID PANEL (REFLEXED) - BCID2

## 2021-02-12 LAB — BASIC METABOLIC PANEL
Anion gap: 10 (ref 5–15)
Anion gap: 10 (ref 5–15)
BUN: 22 mg/dL (ref 8–23)
BUN: 19 mg/dL (ref 8–23)
CO2: 20 mmol/L — ABNORMAL LOW (ref 22–32)
CO2: 20 mmol/L — ABNORMAL LOW (ref 22–32)
Calcium: 8.1 mg/dL — ABNORMAL LOW (ref 8.9–10.3)
Calcium: 8.2 mg/dL — ABNORMAL LOW (ref 8.9–10.3)
Chloride: 110 mmol/L (ref 98–111)
Chloride: 107 mmol/L (ref 98–111)
Creatinine, Ser: 1.93 mg/dL — ABNORMAL HIGH (ref 0.44–1.00)
Creatinine, Ser: 1.63 mg/dL — ABNORMAL HIGH (ref 0.44–1.00)
GFR, Estimated: 28 mL/min — ABNORMAL LOW (ref >60)
GFR, Estimated: 34 mL/min — ABNORMAL LOW (ref >60)
Glucose, Bld: 194 mg/dL — ABNORMAL HIGH (ref 70–99)
Glucose, Bld: 249 mg/dL — ABNORMAL HIGH (ref 70–99)
Potassium: 3.9 mmol/L (ref 3.5–5.1)
Potassium: 3.8 mmol/L (ref 3.5–5.1)
Sodium: 140 mmol/L (ref 135–145)
Sodium: 137 mmol/L (ref 135–145)

## 2021-02-12 LAB — CBC
HCT: 30.1 % — ABNORMAL LOW (ref 36.0–46.0)
HCT: 30.9 % — ABNORMAL LOW (ref 36.0–46.0)
Hemoglobin: 9.1 g/dL — ABNORMAL LOW (ref 12.0–15.0)
Hemoglobin: 9.6 g/dL — ABNORMAL LOW (ref 12.0–15.0)
MCH: 27.7 pg (ref 26.0–34.0)
MCH: 27.9 pg (ref 26.0–34.0)
MCHC: 30.2 g/dL (ref 30.0–36.0)
MCHC: 31.1 g/dL (ref 30.0–36.0)
MCV: 91.5 fL (ref 80.0–100.0)
MCV: 89.8 fL (ref 80.0–100.0)
Platelets: 117 10*3/uL — ABNORMAL LOW (ref 150–400)
Platelets: 127 10*3/uL — ABNORMAL LOW (ref 150–400)
RBC: 3.29 MIL/uL — ABNORMAL LOW (ref 3.87–5.11)
RBC: 3.44 MIL/uL — ABNORMAL LOW (ref 3.87–5.11)
RDW: 15.8 % — ABNORMAL HIGH (ref 11.5–15.5)
RDW: 15.7 % — ABNORMAL HIGH (ref 11.5–15.5)
WBC: 4.9 10*3/uL (ref 4.0–10.5)
WBC: 4.9 10*3/uL (ref 4.0–10.5)
nRBC: 0 % (ref 0.0–0.2)
nRBC: 0 % (ref 0.0–0.2)

## 2021-02-12 LAB — HIV ANTIBODY (ROUTINE TESTING W REFLEX): HIV Screen 4th Generation wRfx: NONREACTIVE

## 2021-02-12 LAB — GLUCOSE, CAPILLARY
Glucose-Capillary: 169 mg/dL — ABNORMAL HIGH (ref 70–99)
Glucose-Capillary: 218 mg/dL — ABNORMAL HIGH (ref 70–99)
Glucose-Capillary: 267 mg/dL — ABNORMAL HIGH (ref 70–99)
Glucose-Capillary: 302 mg/dL — ABNORMAL HIGH (ref 70–99)

## 2021-02-12 LAB — AMMONIA: Ammonia: 63 umol/L — ABNORMAL HIGH (ref 9–35)

## 2021-02-12 LAB — BRAIN NATRIURETIC PEPTIDE: B Natriuretic Peptide: 147.6 pg/mL — ABNORMAL HIGH (ref 0.0–100.0)

## 2021-02-12 LAB — MAGNESIUM: Magnesium: 1.7 mg/dL (ref 1.7–2.4)

## 2021-02-12 LAB — RPR: RPR Ser Ql: NONREACTIVE

## 2021-02-12 MED ORDER — AMLODIPINE BESYLATE 10 MG PO TABS
10.0000 mg | ORAL_TABLET | Freq: Every day | ORAL | Status: DC
  Administered 2021-02-12 – 2021-02-16 (×5): 10 mg via ORAL
  Filled 2021-02-12 (×5): qty 1

## 2021-02-12 MED ORDER — LACTULOSE 10 GM/15ML PO SOLN
20.0000 g | Freq: Three times a day (TID) | ORAL | Status: DC
  Administered 2021-02-12 – 2021-02-14 (×7): 20 g via ORAL
  Filled 2021-02-12 (×7): qty 30

## 2021-02-12 MED ORDER — POLYETHYLENE GLYCOL 3350 17 G PO PACK
17.0000 g | PACK | Freq: Two times a day (BID) | ORAL | Status: DC
  Administered 2021-02-12 – 2021-02-16 (×5): 17 g via ORAL
  Filled 2021-02-12 (×7): qty 1

## 2021-02-12 MED ORDER — HALOPERIDOL LACTATE 5 MG/ML IJ SOLN: 1.0000 mg | Freq: Four times a day (QID) | INTRAMUSCULAR | Status: DC | PRN

## 2021-02-12 MED ORDER — LACTATED RINGERS IV SOLN: INTRAVENOUS | Status: DC

## 2021-02-12 MED ORDER — MAGNESIUM SULFATE IN D5W 1-5 GM/100ML-% IV SOLN
1.0000 g | Freq: Once | INTRAVENOUS | Status: AC
  Administered 2021-02-12: 1 g via INTRAVENOUS
  Filled 2021-02-12: qty 100

## 2021-02-12 NOTE — Procedures (Signed)
Patient Name: [name]  MRN: [id number]  Epilepsy Attending: Lora Havens  Referring Physician/Provider: Dr Harrold Donath Date: 02/12/2021 Duration: 23.50 mins  Patient history: 65 year old female presenting to the ED after being found in an awake unresponsive state at home. EEG to evaluate for seizure.   Level of alertness: Awake  AEDs during EEG study: None  Technical aspects: This EEG study was done with scalp electrodes positioned according to the 10-20 International system of electrode placement. Electrical activity was acquired at a sampling rate of 500Hz  and reviewed with a high frequency filter of 70Hz  and a low frequency filter of 1Hz . EEG data were recorded continuously and digitally stored.   Description: The posterior dominant rhythm consists of 7 Hz activity of moderate voltage (25-35 uV) seen predominantly in posterior head regions, symmetric and reactive to eye opening and eye closing. EEG showed continuous generalized 5 to 6 Hz theta as well as intermittent 2-3hz  delta slowing. Generalized periodic discharges with triphasic morphology at  1 Hz were also noted. Hyperventilation and photic stimulation were not performed.     ABNORMALITY - Periodic discharges with triphasic morphology, generalized ( GPDs) - Continuous slow, generalized  IMPRESSION: This study is suggestive of moderate diffuse encephalopathy, nonspecific etiology but could be secondary to toxic-metabolic causes. No seizures or definite epileptiform discharges were seen throughout the recording.  [name]

## 2021-02-12 NOTE — Progress Notes (Signed)
PROGRESS NOTE                                                                                                                                                                                                             Patient Demographics:    [name], is a 65 y.o. female, DOB - 29-Mar-1956, XQJ:[id number]  Admit date - 02/11/2021   Admitting Physician Eben Burow, MD  Outpatient Primary MD for the patient is [name], NP  LOS - 1  Chief Complaint  Patient presents with  . Code Stroke       Brief Narrative (HPI from H&P)   [name] is a 65 y.o. female with medical history significant for CKD, DMT2, hypothyroidism, Cirrhosis, TIA, Paroxysmal A-fib not on anticoagulation. She presented by EMS for evaluation of AMS. Reportedly her husband found her with her eyes open but unresponsive with a rightward gaze, no reported seizure-like activity initial CT head was unremarkable she was seen by neurology and thought to have severe metabolic/toxic encephalopathy and admitted for further work-up.   Subjective:    [name] today has, No headache, No chest pain, No abdominal pain - No Nausea, No new weakness tingling or numbness, No Cough - SOB.     Assessment  & Plan :     1. AMS - per when the episode happened suddenly, nothing like this is happened before, question if she had an episode of seizure, CT head nonacute, mentation according to neurology is improving, she also has mild UTI and possible pneumonia along with elevated ammonia levels.  Husband denies any significant alcohol or drug intake.  EEG and MRI are pending, with supportive care mentation is improving.  For now supportive care, for agitation will use Haldol if needed.  Continue to monitor.  2. UTI/CAP - on combination of Rocephin and doxycycline and monitor.  3. High Ammonia - no history of liver issues, could be constipated, placed on lactulose along with bowel regimen.  4. HTN - placed  on Norvasc.  5. AKI on CKD 3 A - improved with hydration.  6.  Low B12 levels.  Placed on supplementation.    7. DM2 - poor outpatient control due to hyperglycemia, currently on sliding scale, will monitor and adjust.  Will require insulin and diabetic education.  Lab Results  Component Value Date   HGBA1C 9.3 (H) 12/13/2020   CBG (last 3)  Recent Labs    02/11/21 1709 02/12/21 0030 02/12/21 4481  GLUCAP 184* 169* 218*      Condition - Extremely Guarded  Family Communication  :  Verdis Frederickson [phone] on 02/12/21  Code Status :  Full  Consults  :  Neuro  Procedures  :    CT Head - Non acute  MRI Brain  EEG   PUD Prophylaxis : none  Disposition Plan  :    Status is: Inpatient  Remains inpatient appropriate because:IV treatments appropriate due to intensity of illness or inability to take PO   Dispo: The patient is from: Home              Anticipated d/c is to: Home              Patient currently is not medically stable to d/c.   Difficult to place patient No  DVT Prophylaxis  :   heparin injection 5,000 Units Start: 02/11/21 0615     Lab Results  Component Value Date   PLT 127 (L) 02/12/2021    Diet :  Diet Order            DIET - DYS 1 Room service appropriate? Yes; Fluid consistency: Thin  Diet effective now                  Inpatient Medications Scheduled Meds: . amLODipine  10 mg Oral Daily  . diazepam  2.5 mg Intravenous Once  . heparin  5,000 Units Subcutaneous Q8H  . lactulose  20 g Oral TID   Continuous Infusions: . cefTRIAXone (ROCEPHIN)  IV 1 g (02/12/21 1103)  . doxycycline (VIBRAMYCIN) IV 100 mg (02/12/21 0836)  . lactated ringers 50 mL/hr at 02/12/21 0829   PRN Meds:.acetaminophen **OR** [DISCONTINUED] acetaminophen, albuterol, fentaNYL (SUBLIMAZE) injection, haloperidol lactate, insulin aspart  Antibiotics  :   Anti-infectives (From admission, onward)   Start     Dose/Rate Route Frequency Ordered Stop   02/12/21  0800  cefTRIAXone (ROCEPHIN) 1 g in sodium chloride 0.9 % 100 mL IVPB        1 g 200 mL/hr over 30 Minutes Intravenous Every 24 hours 02/11/21 0600     02/11/21 0800  doxycycline (VIBRAMYCIN) 100 mg in sodium chloride 0.9 % 250 mL IVPB        100 mg 125 mL/hr over 120 Minutes Intravenous Every 12 hours 02/11/21 0600     02/11/21 0500  cefTRIAXone (ROCEPHIN) 2 g in sodium chloride 0.9 % 100 mL IVPB        2 g 200 mL/hr over 30 Minutes Intravenous  Once 02/11/21 0448 02/11/21 0545          Objective:   Vitals:   02/11/21 2048 02/12/21 0030 02/12/21 0531 02/12/21 1109  BP: (!) 146/74 (!) 183/86 (!) 183/94 (!) 162/74  Pulse: (!) 102 96 98 94  Resp: 20 20 20 16   Temp: 98.4 F (36.9 C) 98.7 F (37.1 C) 98.7 F (37.1 C) 98.8 F (37.1 C)  TempSrc: Oral Oral Oral Oral  SpO2: 95% 94% 96% 95%  Weight:      Height:        SpO2: 95 % O2 Flow Rate (L/min): 2 L/min  Wt Readings from Last 3 Encounters:  02/11/21 72.4 kg  01/25/21 72.1 kg  01/11/21 72.1 kg     Intake/Output Summary (Last 24 hours) at 02/12/2021 1141 Last data filed at 02/12/2021 0900 Gross per 24 hour  Intake 1513.6 ml  Output 300 ml  Net 1213.6 ml     Physical  Exam  Awake- confused, No new F.N deficits,   [place].AT,PERRAL Supple Neck,No JVD, No cervical lymphadenopathy appriciated.  Symmetrical Chest wall movement, Good air movement bilaterally, CTAB RRR,No Gallops,Rubs or new Murmurs, No Parasternal Heave +ve B.Sounds, Abd Soft, No tenderness, No organomegaly appriciated, No rebound - guarding or rigidity. No Cyanosis, Clubbing or edema, No new Rash or bruise       Data Review:   Recent Labs  Lab 02/11/21 0150 02/11/21 0204 02/11/21 0601 02/12/21 0227 02/12/21 0831  WBC 7.8  --  7.4 4.9 4.9  HGB 10.9* 11.9* 9.7* 9.1* 9.6*  HCT 36.0 35.0* 32.3* 30.1* 30.9*  PLT 151  --  126* 117* 127*  MCV 92.8  --  93.6 91.5 89.8  MCH 28.1  --  28.1 27.7 27.9  MCHC 30.3  --  30.0 30.2 31.1  RDW 15.9*  --  15.9*  15.8* 15.7*  LYMPHSABS 0.8  --   --   --   --   MONOABS 0.4  --   --   --   --   EOSABS 0.1  --   --   --   --   BASOSABS 0.0  --   --   --   --     Recent Labs  Lab 02/11/21 0150 02/11/21 0204 02/11/21 0601 02/11/21 0633 02/12/21 0227 02/12/21 0831  NA 138 139  --   --  140 137  K 4.6 4.7  --   --  3.9 3.8  CL 107 108  --   --  110 107  CO2 20*  --   --   --  20* 20*  GLUCOSE 253* 246*  --   --  194* 249*  BUN 20 28*  --   --  22 19  CREATININE 2.58* 2.60* 2.56*  --  1.93* 1.63*  CALCIUM 8.5*  --   --   --  8.1* 8.2*  AST 44*  --   --   --   --   --   ALT 24  --   --   --   --   --   ALKPHOS 123  --   --   --   --   --   BILITOT 0.7  --   --   --   --   --   ALBUMIN 3.5  --   --   --   --   --   MG  --   --   --   --   --  1.7  INR 1.1  --   --   --   --   --   TSH  --   --   --  4.528*  --   --   AMMONIA  --   --   --  50*  --  63*  BNP  --   --   --   --   --  147.6*    Recent Labs  Lab 02/11/21 0305 02/12/21 0831  BNP  --  147.6*  SARSCOV2NAA NEGATIVE  --     ------------------------------------------------------------------------------------------------------------------ No results for input(s): CHOL, HDL, LDLCALC, TRIG, CHOLHDL, LDLDIRECT in the last 72 hours.  Lab Results  Component Value Date   HGBA1C 9.3 (H) 12/13/2020   ------------------------------------------------------------------------------------------------------------------ Recent Labs    02/11/21 0633  TSH 4.528*   ------------------------------------------------------------------------------------------------------------------ Recent Labs    02/11/21 0633  [id number]    Coagulation profile Recent Labs  Lab 02/11/21  0150  INR 1.1    No results for input(s): DDIMER in the last 72 hours.  Cardiac Enzymes No results for input(s): CKMB, TROPONINI, MYOGLOBIN in the last 168 hours.  Invalid input(s):  CK ------------------------------------------------------------------------------------------------------------------    Component Value Date/Time   BNP 147.6 (H) 02/12/2021 0831    Micro Results Recent Results (from the past 240 hour(s))  SARS CORONAVIRUS 2 (TAT 6-24 HRS) Nasopharyngeal Nasopharyngeal Swab     Status: None   Collection Time: 02/11/21  3:05 AM   Specimen: Nasopharyngeal Swab  Result Value Ref Range Status   SARS Coronavirus 2 NEGATIVE NEGATIVE Final    Comment: (NOTE) SARS-CoV-2 target nucleic acids are NOT DETECTED.  The SARS-CoV-2 RNA is generally detectable in upper and lower respiratory specimens during the acute phase of infection. Negative results do not preclude SARS-CoV-2 infection, do not rule out co-infections with other pathogens, and should not be used as the sole basis for treatment or other patient management decisions. Negative results must be combined with clinical observations, patient history, and epidemiological information. The expected result is Negative.  Fact Sheet for Patients: SugarRoll.be  Fact Sheet for Healthcare Providers: https://www.woods-mathews.com/  This test is not yet approved or cleared by the [place] FDA and  has been authorized for detection and/or diagnosis of SARS-CoV-2 by FDA under an Emergency Use Authorization (EUA). This EUA will remain  in effect (meaning this test can be used) for the duration of the COVID-19 declaration under Se ction 564(b)(1) of the Act, 21 U.S.C. section 360bbb-3(b)(1), unless the authorization is terminated or revoked sooner.  Performed at [name], [place] [address], [place], [place] [address]   Culture, blood (Routine X 2) w Reflex to ID Panel     Status: None (Preliminary result)   Collection Time: 02/11/21  4:49 AM   Specimen: BLOOD  Result Value Ref Range Status   Specimen Description BLOOD BLOOD LEFT HAND  Final   Special  Requests   Final    BOTTLES DRAWN AEROBIC AND ANAEROBIC Blood Culture adequate volume   Culture  Setup Time   Final    GRAM POSITIVE COCCI AEROBIC BOTTLE ONLY RESULTS PREVIOUSLY CALLED AND VERIFIED Performed at [facility], [place] [address] [place], [place] [address]    Culture PENDING  Incomplete   Report Status PENDING  Incomplete  Culture, blood (Routine X 2) w Reflex to ID Panel     Status: None (Preliminary result)   Collection Time: 02/11/21  4:54 AM   Specimen: BLOOD  Result Value Ref Range Status   Specimen Description BLOOD BLOOD LEFT FOREARM  Final   Special Requests   Final    BOTTLES DRAWN AEROBIC AND ANAEROBIC Blood Culture adequate volume   Culture  Setup Time   Final    GRAM POSITIVE COCCI ANAEROBIC BOTTLE ONLY CRITICAL RESULT CALLED TO, READ BACK BY AND VERIFIED WITH: PHARMD JAMES LEDFORD 02/12/2021 AT 0044 A.HUGHES    Culture   Final    GRAM POSITIVE COCCI CULTURE REINCUBATED FOR BETTER GROWTH Performed at [facility], [place] [address] [place], [place] [address]    Report Status PENDING  Incomplete  Blood Culture ID Panel (Reflexed)     Status: Abnormal   Collection Time: 02/11/21  4:54 AM  Result Value Ref Range Status   Enterococcus faecalis NOT DETECTED NOT DETECTED Final   Enterococcus Faecium NOT DETECTED NOT DETECTED Final   Listeria monocytogenes NOT DETECTED NOT DETECTED Final   Staphylococcus species  DETECTED (A) NOT DETECTED Final    Comment: CRITICAL RESULT CALLED TO, READ BACK BY AND VERIFIED WITH: PHARMD JAMES LEDFORD 02/12/2021 AT 0044 A.HUGHES    Staphylococcus aureus (BCID) NOT DETECTED NOT DETECTED Final   Staphylococcus epidermidis NOT DETECTED NOT DETECTED Final   Staphylococcus lugdunensis NOT DETECTED NOT DETECTED Final   Streptococcus species NOT DETECTED NOT DETECTED Final   Streptococcus agalactiae NOT DETECTED NOT DETECTED Final   Streptococcus pneumoniae NOT DETECTED NOT DETECTED Final   Streptococcus pyogenes NOT  DETECTED NOT DETECTED Final   A.calcoaceticus-baumannii NOT DETECTED NOT DETECTED Final   Bacteroides fragilis NOT DETECTED NOT DETECTED Final   Enterobacterales NOT DETECTED NOT DETECTED Final   Enterobacter cloacae complex NOT DETECTED NOT DETECTED Final   Escherichia coli NOT DETECTED NOT DETECTED Final   Klebsiella aerogenes NOT DETECTED NOT DETECTED Final   Klebsiella oxytoca NOT DETECTED NOT DETECTED Final   Klebsiella pneumoniae NOT DETECTED NOT DETECTED Final   Proteus species NOT DETECTED NOT DETECTED Final   Salmonella species NOT DETECTED NOT DETECTED Final   Serratia marcescens NOT DETECTED NOT DETECTED Final   Haemophilus influenzae NOT DETECTED NOT DETECTED Final   Neisseria meningitidis NOT DETECTED NOT DETECTED Final   Pseudomonas aeruginosa NOT DETECTED NOT DETECTED Final   Stenotrophomonas maltophilia NOT DETECTED NOT DETECTED Final   Candida albicans NOT DETECTED NOT DETECTED Final   Candida auris NOT DETECTED NOT DETECTED Final   Candida glabrata NOT DETECTED NOT DETECTED Final   Candida krusei NOT DETECTED NOT DETECTED Final   Candida parapsilosis NOT DETECTED NOT DETECTED Final   Candida tropicalis NOT DETECTED NOT DETECTED Final   Cryptococcus neoformans/gattii NOT DETECTED NOT DETECTED Final    Comment: Performed at [facility], [address] [place], [place] [address]    Radiology Reports DG Chest 2 View  Result Date: 01/25/2021 CLINICAL DATA:  Chest pain EXAM: CHEST - 2 VIEW COMPARISON:  None. FINDINGS: The heart size and mediastinal contours are within normal limits. Both lungs are clear. The visualized skeletal structures are unremarkable. IMPRESSION: No active cardiopulmonary disease. Electronically Signed   By: Ulyses Jarred M.D.   On: 01/25/2021 02:06   DG Chest Port 1 View  Result Date: 02/11/2021 CLINICAL DATA:  Hypoxia. EXAM: PORTABLE CHEST 1 VIEW COMPARISON:  Chest x-ray 01/25/2021, chest x-ray 12/12/2020, CT chest 07/24/2019 FINDINGS: The  heart size and mediastinal contours are unchanged. Aortic calcification. Patchy left lower lobe opacities. Similar appearing coarsened interstitial markings with no overt pulmonary edema. Trace left pleural effusion not excluded. No pneumothorax. No acute osseous abnormality. IMPRESSION: Patchy left lower lobe opacities and possible trace left pleural effusion. Finding may represent atelectasis versus infection/inflammation. Consider PA and lateral view of the chest for further evaluation. Electronically Signed   By: Iven Finn M.D.   On: 02/11/2021 02:55   CT HEAD CODE STROKE WO CONTRAST  Result Date: 02/11/2021 CLINICAL DATA:  Code stroke.  Acute neurologic deficit EXAM: CT HEAD WITHOUT CONTRAST TECHNIQUE: Contiguous axial images were obtained from the base of the skull through the vertex without intravenous contrast. COMPARISON:  12/12/2020 FINDINGS: Brain: There is no mass, hemorrhage or extra-axial collection. The size and configuration of the ventricles and extra-axial CSF spaces are normal. There is hypoattenuation of the periventricular white matter, most commonly indicating chronic ischemic microangiopathy. Vascular: No abnormal hyperdensity of the major intracranial arteries or dural venous sinuses. No intracranial atherosclerosis. Skull: The visualized skull base, calvarium and extracranial soft tissues are normal. Sinuses/Orbits: No fluid  levels or advanced mucosal thickening of the visualized paranasal sinuses. No mastoid or middle ear effusion. The orbits are normal. ASPECTS [facility] Stroke Program Early CT Score) - Ganglionic level infarction (caudate, lentiform nuclei, internal capsule, insula, M1-M3 cortex): 7 - Supraganglionic infarction (M4-M6 cortex): 3 Total score (0-10 with 10 being normal): 10 IMPRESSION: 1. Chronic ischemic microangiopathy without acute intracranial abnormality. 2. ASPECTS is 10. 3. These results were communicated to Dr. Kerney Elbe at 2:22 am on 02/11/2021 by text  page via the [facility] messaging system. Electronically Signed   By: Ulyses Jarred M.D.   On: 02/11/2021 02:23    Time Spent in minutes  30   Lala Lund M.D on 02/12/2021 at 11:41 AM  To page go to [email or web address]

## 2021-02-12 NOTE — Progress Notes (Signed)
EEG completed, results pending. 

## 2021-02-12 NOTE — Progress Notes (Signed)
PHARMACY - PHYSICIAN COMMUNICATION CRITICAL VALUE ALERT - BLOOD CULTURE IDENTIFICATION (BCID)  [name] is an 65 y.o. female who presented to [facility] on 02/11/2021 with a chief complaint of altered mental status  Assessment:  ?CAP, blood culture likely contaminant  Name of physician (or Provider) Contacted: Dr. Cyd Silence  Current antibiotics: Ceftriaxone/Doxycycline  Changes to prescribed antibiotics recommended:  No changes for now  Results for orders placed or performed during the hospital encounter of 02/11/21  Blood Culture ID Panel (Reflexed) (Collected: 02/11/2021  4:54 AM)  Result Value Ref Range   Enterococcus faecalis NOT DETECTED NOT DETECTED   Enterococcus Faecium NOT DETECTED NOT DETECTED   Listeria monocytogenes NOT DETECTED NOT DETECTED   Staphylococcus species DETECTED (A) NOT DETECTED   Staphylococcus aureus (BCID) NOT DETECTED NOT DETECTED   Staphylococcus epidermidis NOT DETECTED NOT DETECTED   Staphylococcus lugdunensis NOT DETECTED NOT DETECTED   Streptococcus species NOT DETECTED NOT DETECTED   Streptococcus agalactiae NOT DETECTED NOT DETECTED   Streptococcus pneumoniae NOT DETECTED NOT DETECTED   Streptococcus pyogenes NOT DETECTED NOT DETECTED   A.calcoaceticus-baumannii NOT DETECTED NOT DETECTED   Bacteroides fragilis NOT DETECTED NOT DETECTED   Enterobacterales NOT DETECTED NOT DETECTED   Enterobacter cloacae complex NOT DETECTED NOT DETECTED   Escherichia coli NOT DETECTED NOT DETECTED   Klebsiella aerogenes NOT DETECTED NOT DETECTED   Klebsiella oxytoca NOT DETECTED NOT DETECTED   Klebsiella pneumoniae NOT DETECTED NOT DETECTED   Proteus species NOT DETECTED NOT DETECTED   Salmonella species NOT DETECTED NOT DETECTED   Serratia marcescens NOT DETECTED NOT DETECTED   Haemophilus influenzae NOT DETECTED NOT DETECTED   Neisseria meningitidis NOT DETECTED NOT DETECTED   Pseudomonas aeruginosa NOT DETECTED NOT DETECTED   Stenotrophomonas  maltophilia NOT DETECTED NOT DETECTED   Candida albicans NOT DETECTED NOT DETECTED   Candida auris NOT DETECTED NOT DETECTED   Candida glabrata NOT DETECTED NOT DETECTED   Candida krusei NOT DETECTED NOT DETECTED   Candida parapsilosis NOT DETECTED NOT DETECTED   Candida tropicalis NOT DETECTED NOT DETECTED   Cryptococcus neoformans/gattii NOT DETECTED NOT DETECTED    [name] 02/12/2021  5:21 AM

## 2021-02-12 NOTE — Progress Notes (Addendum)
The patient reported that her vision is blurry and she is currently stuttering when speaking, RN alerted MD and neurology was present at bedside. RN expressed concerns to both MD and neurology about patient possibly being more appropriate for progressive unit due to patient's various symptoms and no new orders have been placed at this time. RN will continue to monitor this patient.

## 2021-02-12 NOTE — Progress Notes (Signed)
Patient is experiencing hallucinations and reported seeing kittens within room. RN alerted MD and no new orders have been placed at this time. RN will continue to monitor this patient.

## 2021-02-12 NOTE — Progress Notes (Addendum)
Neurology Progress Note  Brief HPI: 65 y.o. with PMHx of anxiety, encephalopathy, CKD, cirrhosis, HTN, stroke with minimal left-sided deficits, paroxysmal AF, type 1 DM who presented from home following an unresponsive state with rightward eye deviation with initial systolic blood pressure of 30mHg with improvement following 500cc IVF.   Subjective: No acute overnight events Neuro called to bedside this morning for concern for worsening neurologic deficits. Patient with mild improvement on evaluation from yesterday evening and marked improvement from presentation  Still pending EEG and MRI.  Patient complains of ongoing back pain this morning which she reports is chronic, as well as headache.   Exam: Vitals:   02/12/21 0030 02/12/21 0531  BP: (!) 183/86 (!) 183/94  Pulse: 96 98  Resp: 20 20  Temp: 98.7 F (37.1 C) 98.7 F (37.1 C)  SpO2: 94% 96%   Gen: Sitting up in bed eating breakfast, in no acute distress Head: Bioccipital nerve tenderness to palpation  Resp: non-labored breathing, no respiratory distress Abd: soft, non-tender  Neuro: Mental Status: Patient remains disoriented to time, place, and is unable to provide specific details about situation but perseverates on "falling a lot at home" with slowed and stuttering speech, baseline dysarthria due to poor dentition, and intermittent word finding difficulty. Naming is intact. She follows simple commands but is unable to follow multi-step commands. Cranial Nerves: PERRL, EOMI without nystagmus, attends to stimuli in all visual fields with monocular double vision in both eyes (yesterday diplopia reported in left eye only), no ptosis, face appears symmetric resting and smiling, hearing is intact to voice, phonation normal, tongue protrudes midline.  Motor: Upper and lower extremities with symmetric strength.  Bilateral upper extremities with equal pronator drift on assessment.  Bilateral lower extremities with pain limited  assessment (complains of back pain with straight leg raise)- with some attempt at antigravity movement on examination but with immediate vertical drift to bed.  Sensory: Examination unreliable with inconsistent reports of sensation to light touch and noxious stimuli throughout bilateral upper and lower extremities. She does wince and verbalize equally with light application of noxious stimuli throughout.  DTR: 3+ and symmetric biceps, 3+ and symmetric patellae  Pertinent Labs:   Basic Metabolic Panel: Recent Labs  Lab 02/11/21 0150 02/11/21 0204 02/11/21 0601 02/12/21 0227 02/12/21 0831  NA 138 139  --  140 137  K 4.6 4.7  --  3.9 3.8  CL 107 108  --  110 107  CO2 20*  --   --  20* 20*  GLUCOSE 253* 246*  --  194* 249*  BUN 20 28*  --  22 19  CREATININE 2.58* 2.60* 2.56* 1.93* 1.63*  CALCIUM 8.5*  --   --  8.1* 8.2*  MG  --   --   --   --  1.7    CBC: Recent Labs  Lab 02/11/21 0150 02/11/21 0204 02/11/21 0601 02/12/21 0227 02/12/21 0831  WBC 7.8  --  7.4 4.9 4.9  NEUTROABS 6.5  --   --   --   --   HGB 10.9* 11.9* 9.7* 9.1* 9.6*  HCT 36.0 35.0* 32.3* 30.1* 30.9*  MCV 92.8  --  93.6 91.5 89.8  PLT 151  --  126* 117* 127*    Coagulation Studies: Recent Labs    02/11/21 0150  LABPROT 14.0  INR 1.1      Lab Results  Component Value Date   VITAMINB12 341 02/11/2021   Lab Results  Component Value Date  TSH 4.528 (H) 02/11/2021   Drugs of Abuse     Component Value Date/Time   LABOPIA NONE DETECTED 02/11/2021 0257   COCAINSCRNUR NONE DETECTED 02/11/2021 0257   COCAINSCRNUR NONE DETECTED 12/12/2020 1845   LABBENZ NONE DETECTED 02/11/2021 0257   AMPHETMU NONE DETECTED 02/11/2021 0257   THCU NONE DETECTED 02/11/2021 0257   LABBARB NONE DETECTED 02/11/2021 0257    Urinalysis    Component Value Date/Time   COLORURINE YELLOW 02/11/2021 0257   APPEARANCEUR CLEAR 02/11/2021 0257   APPEARANCEUR Cloudy (A) 12/02/2019 1259   LABSPEC >1.030 (H) 02/11/2021 0257    LABSPEC 1.033 10/08/2014 0144   PHURINE 5.0 02/11/2021 0257   GLUCOSEU 100 (A) 02/11/2021 0257   GLUCOSEU >=1000 (A) 11/01/2020 1322   HGBUR MODERATE (A) 02/11/2021 0257   BILIRUBINUR NEGATIVE 02/11/2021 0257   BILIRUBINUR Negative 12/02/2019 1259   BILIRUBINUR Negative 10/08/2014 0144   KETONESUR 15 (A) 02/11/2021 0257   PROTEINUR 100 (A) 02/11/2021 0257   UROBILINOGEN 1.0 11/01/2020 1322   NITRITE NEGATIVE 02/11/2021 0257   LEUKOCYTESUR SMALL (A) 02/11/2021 0257   LEUKOCYTESUR Negative 10/08/2014 0144   Results for orders placed or performed during the hospital encounter of 02/11/21  SARS CORONAVIRUS 2 (TAT 6-24 HRS) Nasopharyngeal Nasopharyngeal Swab     Status: None   Collection Time: 02/11/21  3:05 AM   Specimen: Nasopharyngeal Swab  Result Value Ref Range Status   SARS Coronavirus 2 NEGATIVE NEGATIVE Final    Comment: (NOTE) SARS-CoV-2 target nucleic acids are NOT DETECTED.  The SARS-CoV-2 RNA is generally detectable in upper and lower respiratory specimens during the acute phase of infection. Negative results do not preclude SARS-CoV-2 infection, do not rule out co-infections with other pathogens, and should not be used as the sole basis for treatment or other patient management decisions. Negative results must be combined with clinical observations, patient history, and epidemiological information. The expected result is Negative.  Fact Sheet for Patients: SugarRoll.be  Fact Sheet for Healthcare Providers: https://www.woods-mathews.com/  This test is not yet approved or cleared by the [place] FDA and  has been authorized for detection and/or diagnosis of SARS-CoV-2 by FDA under an Emergency Use Authorization (EUA). This EUA will remain  in effect (meaning this test can be used) for the duration of the COVID-19 declaration under Se ction 564(b)(1) of the Act, 21 U.S.C. section 360bbb-3(b)(1), unless the authorization  is terminated or revoked sooner.  Performed at [facility], [place] [address] [place], [place] [address]   Culture, blood (Routine X 2) w Reflex to ID Panel     Status: None (Preliminary result)   Collection Time: 02/11/21  4:49 AM   Specimen: BLOOD  Result Value Ref Range Status   Specimen Description BLOOD BLOOD LEFT HAND  Final   Special Requests   Final    BOTTLES DRAWN AEROBIC AND ANAEROBIC Blood Culture adequate volume   Culture  Setup Time   Final    GRAM POSITIVE COCCI AEROBIC BOTTLE ONLY RESULTS PREVIOUSLY CALLED AND VERIFIED Performed at [facility], [place] [address] [place], [place] [address]    Culture PENDING  Incomplete   Report Status PENDING  Incomplete  Culture, blood (Routine X 2) w Reflex to ID Panel     Status: None (Preliminary result)   Collection Time: 02/11/21  4:54 AM   Specimen: BLOOD  Result Value Ref Range Status   Specimen Description BLOOD BLOOD LEFT FOREARM  Final   Special Requests   Final  BOTTLES DRAWN AEROBIC AND ANAEROBIC Blood Culture adequate volume   Culture  Setup Time   Final    GRAM POSITIVE COCCI ANAEROBIC BOTTLE ONLY Organism ID to follow CRITICAL RESULT CALLED TO, READ BACK BY AND VERIFIED WITH: PHARMD JAMES LEDFORD 02/12/2021 AT 0044 A.HUGHES Performed at [facility], [place] [address], [place], [place] [address]    Culture PENDING  Incomplete   Report Status PENDING  Incomplete  Blood Culture ID Panel (Reflexed)     Status: Abnormal   Collection Time: 02/11/21  4:54 AM  Result Value Ref Range Status   Enterococcus faecalis NOT DETECTED NOT DETECTED Final   Enterococcus Faecium NOT DETECTED NOT DETECTED Final   Listeria monocytogenes NOT DETECTED NOT DETECTED Final   Staphylococcus species DETECTED (A) NOT DETECTED Final    Comment: CRITICAL RESULT CALLED TO, READ BACK BY AND VERIFIED WITH: PHARMD JAMES LEDFORD 02/12/2021 AT 0044 A.HUGHES    Staphylococcus aureus (BCID) NOT DETECTED NOT DETECTED Final    Staphylococcus epidermidis NOT DETECTED NOT DETECTED Final   Staphylococcus lugdunensis NOT DETECTED NOT DETECTED Final   Streptococcus species NOT DETECTED NOT DETECTED Final   Streptococcus agalactiae NOT DETECTED NOT DETECTED Final   Streptococcus pneumoniae NOT DETECTED NOT DETECTED Final   Streptococcus pyogenes NOT DETECTED NOT DETECTED Final   A.calcoaceticus-baumannii NOT DETECTED NOT DETECTED Final   Bacteroides fragilis NOT DETECTED NOT DETECTED Final   Enterobacterales NOT DETECTED NOT DETECTED Final   Enterobacter cloacae complex NOT DETECTED NOT DETECTED Final   Escherichia coli NOT DETECTED NOT DETECTED Final   Klebsiella aerogenes NOT DETECTED NOT DETECTED Final   Klebsiella oxytoca NOT DETECTED NOT DETECTED Final   Klebsiella pneumoniae NOT DETECTED NOT DETECTED Final   Proteus species NOT DETECTED NOT DETECTED Final   Salmonella species NOT DETECTED NOT DETECTED Final   Serratia marcescens NOT DETECTED NOT DETECTED Final   Haemophilus influenzae NOT DETECTED NOT DETECTED Final   Neisseria meningitidis NOT DETECTED NOT DETECTED Final   Pseudomonas aeruginosa NOT DETECTED NOT DETECTED Final   Stenotrophomonas maltophilia NOT DETECTED NOT DETECTED Final   Candida albicans NOT DETECTED NOT DETECTED Final   Candida auris NOT DETECTED NOT DETECTED Final   Candida glabrata NOT DETECTED NOT DETECTED Final   Candida krusei NOT DETECTED NOT DETECTED Final   Candida parapsilosis NOT DETECTED NOT DETECTED Final   Candida tropicalis NOT DETECTED NOT DETECTED Final   Cryptococcus neoformans/gattii NOT DETECTED NOT DETECTED Final    Comment: Performed at [facility], [address], [place], [place] [address]   Ethanol on admission negative Ammonia: 50 RPR, HIV negative Thiamine, pending  Imaging Reviewed: CT Head 6/4: 1. Chronic ischemic microangiopathy without acute intracranial abnormality. 2. ASPECTS is 10.  MRI brain reviewed, agree with radiology no acute  intracranial process  Routine EEG consistent with toxic/metabolic process  Assessment: 65 year old female presenting to the ED after being found in an awake unresponsive state at home.  1. Exam reveals disorientation, stuttering speech, increased latencies of verbal and motor responses, monocular diplopia in both eyes but without hemiparesis or facial droop.  2. DDx: presentation most consistent with metabolic encephalopathy possibly related to polypharmacy and AKI (which is improving).  3. MRI brain rules out acute intracranial process such as stroke and EEG consistent with toxic/metabolic encephalopathy without evidence of seizure activity   Recommendations: - thiamine level pending, low index of suspicion but please treat if found to be low - Vitamin B12 of 341. Supplement with  B12 1,000 mcg PO daily for goal B12 level of > 400 - Frequent reorientation - Try to minimize deliriogenic medications as much as possible (J Am Geriatr Soc. 2012 Apr;60(4):616-31): benzodiazepines, anticholinergics, diphenhydramine, antihistamines, narcotics, Ambien/Lunesta/Sonata etc. - No indication for acute 2-hour NIH checks from a neurology perspective, this was ordered by primary team therefore defer cancellation to them - Discussed evaluation and plan of care with Dr. Candiss Norse  - Neurology will be available on an as-needed basis going forward, please reconsult if new questions arise  Anibal Henderson, AGACNP-BC [facility] [phone]  Attending Neurologist's note:  I personally saw this patient, gathering history, performing a full neurologic examination, reviewing relevant labs, personally reviewing relevant imaging including initial head CT, and formulated the assessment and plan, adding the note above for completeness and clarity to accurately reflect my thoughts  Lesleigh Noe MD-PhD [facility] [phone] Available 7 AM to 7 PM, outside these hours please contact  Neurologist on call listed on AMION   Greater than 35 minutes were spent in the care of this patient today, over half of which is was at bedside

## 2021-02-12 NOTE — Progress Notes (Signed)
Physical Therapy Note   PT eval complete with full note to follow;   Recommend going home with HHPT f/u for transition out of hospital;   Pretty well-equipped, no DME recs at this time;   Roney Marion, [place] Pager [phone] Office [phone]

## 2021-02-12 NOTE — Evaluation (Signed)
Physical Therapy Evaluation Patient Details Name: [name] MRN: [id number] DOB: 10-27-1955 Today's Date: 02/12/2021   History of Present Illness  [name] is a 65 y.o. female, She presented by EMS for evaluation of AMS. Reportedly her husband found her with her eyes open but unresponsive with a rightward gaze, no reported seizure-like activity initial CT head was unremarkable she was seen by neurology and thought to have severe metabolic/toxic encephalopathy and admitted for further work-up. with medical history significant for CKD, DMT2, hypothyroidism, Cirrhosis, TIA, Paroxysmal A-fib not on anticoagulation.  Clinical Impression   Pt admitted with above diagnosis. Comes from home where she lives withher husband in a single level home; REports she typically walks with RW at baseline; Presents to PT with decr activity tolerance, gait and balance dysfunction; Walked 65 ft in hallways with miguard assist and RW.  Pt currently with functional limitations due to the deficits listed below (see PT Problem List). Pt will benefit from skilled PT to increase their independence and safety with mobility to allow discharge to the venue listed below.       Follow Up Recommendations Home health PT;Supervision/Assistance - 24 hour    Equipment Recommendations  Rolling walker with 5" wheels;3in1 (PT)    Recommendations for Other Services       Precautions / Restrictions Precautions Precautions: Fall Precaution Comments: Fall risk reduced with use of RW      Mobility  Bed Mobility Overal bed mobility: Needs Assistance Bed Mobility: Supine to Sit     Supine to sit: Min assist     General bed mobility comments: Min handheld assist to pull to sit    Transfers Overall transfer level: Needs assistance Equipment used: Rolling walker (2 wheeled) Transfers: Sit to/from Stand Sit to Stand: Min assist         General transfer comment: Min assit to steady at initial  stand  Ambulation/Gait Ambulation/Gait assistance: Min guard (with and without physical contact) Gait Distance (Feet): 65 Feet Assistive device: Rolling walker (2 wheeled) Gait Pattern/deviations: Step-through pattern Gait velocity: slowed   General Gait Details: Cues to self-monitor for activity tolerance  Stairs            Wheelchair Mobility    Modified Rankin (Stroke Patients Only)       Balance Overall balance assessment: Needs assistance   Sitting balance-Leahy Scale: Fair       Standing balance-Leahy Scale: Poor (approaching Fair)                               Pertinent Vitals/Pain Pain Assessment: No/denies pain    Home Living Family/patient expects to be discharged to:: Private residence Living Arrangements: Spouse/significant other Available Help at Discharge: Family;Available 24 hours/day Type of Home: Mobile home Home Access: Ramped entrance     Home Layout: One level Home Equipment: [place] - 2 wheels;Bedside commode;Shower seat;Wheelchair - manual      Prior Function Level of Independence: Needs assistance;Independent with assistive device(s)   Gait / Transfers Assistance Needed: Uses RW  ADL's / Homemaking Assistance Needed: Pt reports being independent with dressing. Pt states that she takes baths to wash up        Hand Dominance        Extremity/Trunk Assessment   Upper Extremity Assessment Upper Extremity Assessment: Generalized weakness    Lower Extremity Assessment Lower Extremity Assessment: Generalized weakness       Communication   Communication:  No difficulties;Expressive difficulties;Other (comment) (Noting a bit of a stutter)  Cognition Arousal/Alertness: Awake/alert Behavior During Therapy: WFL for tasks assessed/performed (for simple mobility tasks) Overall Cognitive Status: No family/caregiver present to determine baseline cognitive functioning                                  General Comments: Pleasant and following all commands related to mobility      General Comments General comments (skin integrity, edema, etc.): VSS    Exercises     Assessment/Plan    PT Assessment Patient needs continued PT services  PT Problem List Decreased strength;Decreased activity tolerance;Decreased balance;Decreased mobility;Decreased cognition;Decreased knowledge of use of DME;Decreased safety awareness       PT Treatment Interventions DME instruction;Gait training;Stair training;Functional mobility training;Therapeutic activities;Therapeutic exercise;Balance training;Cognitive remediation;Patient/family education    PT Goals (Current goals can be found in the Care Plan section)  Acute Rehab PT Goals Patient Stated Goal: Hopes to be home soon PT Goal Formulation: With patient Time For Goal Achievement: 02/26/21 Potential to Achieve Goals: Good    Frequency Min 3X/week   Barriers to discharge        Co-evaluation               AM-PAC PT "6 Clicks" Mobility  Outcome Measure Help needed turning from your back to your side while in a flat bed without using bedrails?: A Little Help needed moving from lying on your back to sitting on the side of a flat bed without using bedrails?: A Little Help needed moving to and from a bed to a chair (including a wheelchair)?: A Little Help needed standing up from a chair using your arms (e.g., wheelchair or bedside chair)?: A Little Help needed to walk in hospital room?: A Little Help needed climbing 3-5 steps with a railing? : A Lot 6 Click Score: 17    End of Session Equipment Utilized During Treatment: Gait belt Activity Tolerance: Patient tolerated treatment well Patient left: with call bell/phone within reach;Other (comment) (on [facility]) Nurse Communication: Mobility status;Other (comment) (On BSC, and will need assist) PT Visit Diagnosis: Unsteadiness on feet (R26.81);History of falling (Z91.81)    Time:  2111-5520 PT Time Calculation (min) (ACUTE ONLY): 40 min   Charges:   PT Evaluation $PT Eval Moderate Complexity: 1 Mod PT Treatments $Gait Training: 23-37 mins        Roney Marion, [place]  Acute Rehabilitation Services Pager [phone] Office [place] 02/12/2021, 7:55 PM

## 2021-02-13 LAB — GLUCOSE, CAPILLARY
Glucose-Capillary: 280 mg/dL — ABNORMAL HIGH (ref 70–99)
Glucose-Capillary: 323 mg/dL — ABNORMAL HIGH (ref 70–99)
Glucose-Capillary: 327 mg/dL — ABNORMAL HIGH (ref 70–99)
Glucose-Capillary: 91 mg/dL (ref 70–99)
Glucose-Capillary: 98 mg/dL (ref 70–99)

## 2021-02-13 LAB — BRAIN NATRIURETIC PEPTIDE: B Natriuretic Peptide: 137.7 pg/mL — ABNORMAL HIGH (ref 0.0–100.0)

## 2021-02-13 LAB — COMPREHENSIVE METABOLIC PANEL
ALT: 35 U/L (ref 0–44)
AST: 33 U/L (ref 15–41)
Albumin: 3.1 g/dL — ABNORMAL LOW (ref 3.5–5.0)
Alkaline Phosphatase: 114 U/L (ref 38–126)
Anion gap: 7 (ref 5–15)
BUN: 12 mg/dL (ref 8–23)
CO2: 21 mmol/L — ABNORMAL LOW (ref 22–32)
Calcium: 8.5 mg/dL — ABNORMAL LOW (ref 8.9–10.3)
Chloride: 108 mmol/L (ref 98–111)
Creatinine, Ser: 1.17 mg/dL — ABNORMAL HIGH (ref 0.44–1.00)
GFR, Estimated: 51 mL/min — ABNORMAL LOW (ref >60)
Glucose, Bld: 344 mg/dL — ABNORMAL HIGH (ref 70–99)
Potassium: 3.4 mmol/L — ABNORMAL LOW (ref 3.5–5.1)
Sodium: 136 mmol/L (ref 135–145)
Total Bilirubin: 0.8 mg/dL (ref 0.3–1.2)
Total Protein: 6 g/dL — ABNORMAL LOW (ref 6.5–8.1)

## 2021-02-13 LAB — CBC WITH DIFFERENTIAL/PLATELET
Abs Immature Granulocytes: 0.01 10*3/uL (ref 0.00–0.07)
Basophils Absolute: 0 10*3/uL (ref 0.0–0.1)
Basophils Relative: 1 %
Eosinophils Absolute: 0.2 10*3/uL (ref 0.0–0.5)
Eosinophils Relative: 4 %
HCT: 30.3 % — ABNORMAL LOW (ref 36.0–46.0)
Hemoglobin: 9.6 g/dL — ABNORMAL LOW (ref 12.0–15.0)
Immature Granulocytes: 0 %
Lymphocytes Relative: 13 %
Lymphs Abs: 0.6 10*3/uL — ABNORMAL LOW (ref 0.7–4.0)
MCH: 27.9 pg (ref 26.0–34.0)
MCHC: 31.7 g/dL (ref 30.0–36.0)
MCV: 88.1 fL (ref 80.0–100.0)
Monocytes Absolute: 0.2 10*3/uL (ref 0.1–1.0)
Monocytes Relative: 5 %
Neutro Abs: 3.4 10*3/uL (ref 1.7–7.7)
Neutrophils Relative %: 77 %
Platelets: 127 10*3/uL — ABNORMAL LOW (ref 150–400)
RBC: 3.44 MIL/uL — ABNORMAL LOW (ref 3.87–5.11)
RDW: 15.6 % — ABNORMAL HIGH (ref 11.5–15.5)
WBC: 4.4 10*3/uL (ref 4.0–10.5)
nRBC: 0 % (ref 0.0–0.2)

## 2021-02-13 LAB — MAGNESIUM: Magnesium: 1.6 mg/dL — ABNORMAL LOW (ref 1.7–2.4)

## 2021-02-13 LAB — AMMONIA: Ammonia: 32 umol/L (ref 9–35)

## 2021-02-13 LAB — PROCALCITONIN: Procalcitonin: <0.1 ng/mL

## 2021-02-13 MED ORDER — POTASSIUM CHLORIDE CRYS ER 20 MEQ PO TBCR
40.0000 meq | EXTENDED_RELEASE_TABLET | Freq: Once | ORAL | Status: AC
  Administered 2021-02-13: 40 meq via ORAL
  Filled 2021-02-13: qty 2

## 2021-02-13 MED ORDER — INSULIN ASPART 100 UNIT/ML IJ SOLN: 0.0000 [IU] | Freq: Every day | INTRAMUSCULAR | Status: DC

## 2021-02-13 MED ORDER — INSULIN ASPART 100 UNIT/ML IJ SOLN
0.0000 [IU] | Freq: Three times a day (TID) | INTRAMUSCULAR | Status: DC
  Administered 2021-02-13: 11 [IU] via SUBCUTANEOUS
  Administered 2021-02-14: 2 [IU] via SUBCUTANEOUS
  Administered 2021-02-14: 5 [IU] via SUBCUTANEOUS
  Administered 2021-02-15: 2 [IU] via SUBCUTANEOUS
  Administered 2021-02-15 (×2): 5 [IU] via SUBCUTANEOUS
  Administered 2021-02-16: 3 [IU] via SUBCUTANEOUS

## 2021-02-13 MED ORDER — TRAMADOL HCL 50 MG PO TABS
50.0000 mg | ORAL_TABLET | Freq: Four times a day (QID) | ORAL | Status: DC | PRN
Start: 2021-02-13 — End: 2021-02-16
  Administered 2021-02-13 – 2021-02-16 (×2): 50 mg via ORAL
  Filled 2021-02-13 (×2): qty 1

## 2021-02-13 MED ORDER — MAGNESIUM SULFATE IN D5W 1-5 GM/100ML-% IV SOLN
1.0000 g | Freq: Once | INTRAVENOUS | Status: AC
  Administered 2021-02-13: 1 g via INTRAVENOUS
  Filled 2021-02-13: qty 100

## 2021-02-13 MED ORDER — INSULIN ASPART 100 UNIT/ML IJ SOLN
3.0000 [IU] | Freq: Three times a day (TID) | INTRAMUSCULAR | Status: DC
  Administered 2021-02-13 – 2021-02-16 (×9): 3 [IU] via SUBCUTANEOUS

## 2021-02-13 MED ORDER — INSULIN GLARGINE 100 UNIT/ML ~~LOC~~ SOLN
25.0000 [IU] | Freq: Every day | SUBCUTANEOUS | Status: DC
  Administered 2021-02-13 – 2021-02-16 (×4): 25 [IU] via SUBCUTANEOUS
  Filled 2021-02-13 (×4): qty 0.25

## 2021-02-13 MED ORDER — CARVEDILOL 3.125 MG PO TABS
3.1250 mg | ORAL_TABLET | Freq: Two times a day (BID) | ORAL | Status: DC
  Administered 2021-02-13 – 2021-02-16 (×7): 3.125 mg via ORAL
  Filled 2021-02-13 (×8): qty 1

## 2021-02-13 MED ORDER — DOXYCYCLINE HYCLATE 100 MG PO TABS
100.0000 mg | ORAL_TABLET | Freq: Two times a day (BID) | ORAL | Status: AC
  Administered 2021-02-13 – 2021-02-15 (×5): 100 mg via ORAL
  Filled 2021-02-13 (×5): qty 1

## 2021-02-13 NOTE — Plan of Care (Signed)
  Problem: Education: Goal: Knowledge of General Education information will improve Description: Including pain rating scale, medication(s)/side effects and non-pharmacologic comfort measures Outcome: Completed/Met   Problem: Clinical Measurements: Goal: Respiratory complications will improve Outcome: Completed/Met Goal: Cardiovascular complication will be avoided Outcome: Completed/Met

## 2021-02-13 NOTE — Progress Notes (Signed)
  Speech Language Pathology Treatment: Dysphagia  Patient Details Name: [name] MRN: [id number] DOB: [birth year] Today's Date: 02/13/2021 Time: 1017-5102 SLP Time Calculation (min) (ACUTE ONLY): 9.47 min  Assessment / Plan / Recommendation Clinical Impression  Pt was seen for dysphagia treatment. She was alert and cooperative. Pt's nurse denied any difficulty with p.o. intake and stated that the pt has been consuming pills whole with thin liquids. Pt expressed her displeasure regarding her current diet stating, "I can't eat that slob anymore...please get me off it." Pt tolerated regular texture solids and consecutive swallows of thin liquids without overt s/sx of aspiration. Mastication was prolonged with regular texture solids, likely due to edentulous status, but functional with dysphagia 3 and oral clearance was adequate. Pt stated that she is able to eat most foods, but prefers softer solids. It was agreed that her diet will be advanced to dysphagia 3 solids with thin liquids. SLP will continue to follow pt.    HPI HPI: Patient is a 65 y.o. female with PMH: CKD, DM-2, hypothyroidism, cirrhosis, TIA, paroxysmal a-fib not on anticoagulation, anxiety, GERD, hiatal hernia, HTN, CVA (with minimal left sided weakness) who presented to hospital via EMS for evaluation of AMS. Husband reportedly found her with eyes open but unresponsive and with rightward gaze. CT negative for acute pathology. CXR revealed Patchy left lower lobe opacities and possible trace left pleural effusion. Finding may represent atelectasis versus  infection/inflammation. SLP evaluation ordered for bedside swallow eval as well as ?slurring of speech. EEG 6/5: moderate diffuse encephalopathy, nonspecific etiology but could be secondary to toxic-metabolic causes. No seizures or definite epileptiform discharges      SLP Plan  Continue with current plan of care       Recommendations  Diet recommendations: Thin liquid;Dysphagia  3 (mechanical soft) Liquids provided via: Cup;Straw Medication Administration: Whole meds with liquid Supervision: Intermittent supervision to cue for compensatory strategies Compensations: Minimize environmental distractions;Slow rate;Small sips/bites Postural Changes and/or Swallow Maneuvers: Seated upright 90 degrees                Oral Care Recommendations: Oral care BID;Staff/trained caregiver to provide oral care Follow up Recommendations: None SLP Visit Diagnosis: Dysphagia, unspecified (R13.10) Plan: Continue with current plan of care       [name] I. [name], [place], [place] Office number [phone] Pager [place] 02/13/2021, 3:17 PM

## 2021-02-13 NOTE — Progress Notes (Signed)
PROGRESS NOTE                                                                                                                                                                                                             Patient Demographics:    [name], is a 65 y.o. female, DOB - [birth year], HCW:[id number]  Admit date - 02/11/2021   Admitting Physician Eben Burow, MD  Outpatient Primary MD for the patient is [name], NP  LOS - 2  Chief Complaint  Patient presents with  . Code Stroke       Brief Narrative (HPI from H&P)   [name] is a 65 y.o. female with medical history significant for CKD, DMT2, hypothyroidism, Cirrhosis, TIA, Paroxysmal A-fib not on anticoagulation. She presented by EMS for evaluation of AMS. Reportedly her husband found her with her eyes open but unresponsive with a rightward gaze, no reported seizure-like activity initial CT head was unremarkable she was seen by neurology and thought to have severe metabolic/toxic encephalopathy and admitted for further work-up.   Subjective:   Patient in bed, appears comfortable, denies any headache, no fever, no chest pain or pressure, no shortness of breath , no abdominal pain. No new focal weakness.   Assessment  & Plan :     1. AMS - per when the episode happened suddenly, nothing like this is happened before, question if she had an episode of seizure, CT head, MRI brain and EEG nonacute, also treated with lactulose for elevated ammonia levels, mentation now close to baseline no focal deficits or headache, continue supportive care PT OT.  Continue bowel regimen to keep ammonia levels low, if stable likely discharge in the next 2 to 3 days.  Question if mental status change was due to toxic and metabolic encephalopathy caused by combination of UTI/CAP elevated ammonia levels.  2. UTI/CAP - on combination of Rocephin and doxycycline and monitor.  3. High Ammonia - no history of liver  issues, was constipated, placed on lactulose along with bowel regimen, much improved.  4. HTN - placed on Norvasc and added Coreg.  5. AKI on CKD 3 A - improved with hydration.  6.  Low B12 levels.  Placed on supplementation.    7. DM2 - poor outpatient control due to hyperglycemia, based on Lantus and adjusted sliding scale, added Premeal NovoLog on 02/13/2021.  I have requested Insulin and diabetic education.  Lab Results  Component Value Date   HGBA1C 9.3 (  H) 12/13/2020   CBG (last 3)  Recent Labs    02/12/21 1729 02/13/21 0047 02/13/21 0643  GLUCAP 302* 280* 323*      Condition - Fair  Family Communication  :  Verdis Frederickson [phone] on 02/12/21  Code Status :  Full  Consults  :  Neuro, ID Dr Linus Salmons - 02/13/21 -phone  Procedures  :    CT Head - Non acute  MRI Brain - Non Acute  EEG - Non Acute  PUD Prophylaxis : none  Disposition Plan  :    Status is: Inpatient  Remains inpatient appropriate because:IV treatments appropriate due to intensity of illness or inability to take PO   Dispo: The patient is from: Home              Anticipated d/c is to: Home              Patient currently is not medically stable to d/c.   Difficult to place patient No  DVT Prophylaxis  :   heparin injection 5,000 Units Start: 02/11/21 0615     Lab Results  Component Value Date   PLT 127 (L) 02/13/2021    Diet :  Diet Order            DIET - DYS 1 Room service appropriate? Yes; Fluid consistency: Thin  Diet effective now                  Inpatient Medications Scheduled Meds: . amLODipine  10 mg Oral Daily  . carvedilol  3.125 mg Oral BID WC  . heparin  5,000 Units Subcutaneous Q8H  . lactulose  20 g Oral TID  . polyethylene glycol  17 g Oral BID   Continuous Infusions: . cefTRIAXone (ROCEPHIN)  IV 1 g (02/13/21 0838)  . doxycycline (VIBRAMYCIN) IV 100 mg (02/13/21 0852)  . magnesium sulfate bolus IVPB     PRN Meds:.acetaminophen **OR** [DISCONTINUED]  acetaminophen, albuterol, haloperidol lactate, insulin aspart, traMADol  Antibiotics  :   Anti-infectives (From admission, onward)   Start     Dose/Rate Route Frequency Ordered Stop   02/12/21 0800  cefTRIAXone (ROCEPHIN) 1 g in sodium chloride 0.9 % 100 mL IVPB        1 g 200 mL/hr over 30 Minutes Intravenous Every 24 hours 02/11/21 0600     02/11/21 0800  doxycycline (VIBRAMYCIN) 100 mg in sodium chloride 0.9 % 250 mL IVPB        100 mg 125 mL/hr over 120 Minutes Intravenous Every 12 hours 02/11/21 0600     02/11/21 0500  cefTRIAXone (ROCEPHIN) 2 g in sodium chloride 0.9 % 100 mL IVPB        2 g 200 mL/hr over 30 Minutes Intravenous  Once 02/11/21 0448 02/11/21 0545          Objective:   Vitals:   02/12/21 1748 02/12/21 2036 02/13/21 0451 02/13/21 0835  BP: (!) 143/81 (!) 164/84 (!) 176/84 (!) 152/73  Pulse: 95 96 95 (!) 102  Resp: 16 20 20 19   Temp: 98.2 F (36.8 C) 98.4 F (36.9 C) 98 F (36.7 C) 98.5 F (36.9 C)  TempSrc: Oral Oral Oral Oral  SpO2: 96% 96% 96% 96%  Weight:  72.4 kg    Height:        SpO2: 96 % O2 Flow Rate (L/min): 2 L/min  Wt Readings from Last 3 Encounters:  02/12/21 72.4 kg  01/25/21 72.1 kg  01/11/21 72.1 kg  Intake/Output Summary (Last 24 hours) at 02/13/2021 0916 Last data filed at 02/13/2021 0700 Gross per 24 hour  Intake 1554.25 ml  Output 1400 ml  Net 154.25 ml     Physical Exam  Awake Alert, No new F.N deficits, Normal affect .AT,PERRAL Supple Neck,No JVD, No cervical lymphadenopathy appriciated.  Symmetrical Chest wall movement, Good air movement bilaterally, CTAB RRR,No Gallops, Rubs or new Murmurs, No Parasternal Heave +ve B.Sounds, Abd Soft, No tenderness, No organomegaly appriciated, No rebound - guarding or rigidity. No Cyanosis, Clubbing or edema, No new Rash or bruise    Data Review:   Recent Labs  Lab 02/11/21 0150 02/11/21 0204 02/11/21 0601 02/12/21 0227 02/12/21 0831 02/13/21 0632  WBC 7.8  --   7.4 4.9 4.9 4.4  HGB 10.9* 11.9* 9.7* 9.1* 9.6* 9.6*  HCT 36.0 35.0* 32.3* 30.1* 30.9* 30.3*  PLT 151  --  126* 117* 127* 127*  MCV 92.8  --  93.6 91.5 89.8 88.1  MCH 28.1  --  28.1 27.7 27.9 27.9  MCHC 30.3  --  30.0 30.2 31.1 31.7  RDW 15.9*  --  15.9* 15.8* 15.7* 15.6*  LYMPHSABS 0.8  --   --   --   --  0.6*  MONOABS 0.4  --   --   --   --  0.2  EOSABS 0.1  --   --   --   --  0.2  BASOSABS 0.0  --   --   --   --  0.0    Recent Labs  Lab 02/11/21 0150 02/11/21 0204 02/11/21 0601 02/11/21 3419 02/12/21 0227 02/12/21 0831 02/13/21 0632  NA 138 139  --   --  140 137 136  K 4.6 4.7  --   --  3.9 3.8 3.4*  CL 107 108  --   --  110 107 108  CO2 20*  --   --   --  20* 20* 21*  GLUCOSE 253* 246*  --   --  194* 249* 344*  BUN 20 28*  --   --  22 19 12   CREATININE 2.58* 2.60* 2.56*  --  1.93* 1.63* 1.17*  CALCIUM 8.5*  --   --   --  8.1* 8.2* 8.5*  AST 44*  --   --   --   --   --  33  ALT 24  --   --   --   --   --  35  ALKPHOS 123  --   --   --   --   --  114  BILITOT 0.7  --   --   --   --   --  0.8  ALBUMIN 3.5  --   --   --   --   --  3.1*  MG  --   --   --   --   --  1.7 1.6*  INR 1.1  --   --   --   --   --   --   TSH  --   --   --  4.528*  --   --   --   AMMONIA  --   --   --  50*  --  63* 32  BNP  --   --   --   --   --  147.6* 137.7*    Recent Labs  Lab 02/11/21 0305 02/12/21 0831 02/13/21 0632  BNP  --  147.6* 137.7*  SARSCOV2NAA NEGATIVE  --   --     ------------------------------------------------------------------------------------------------------------------ No results for input(s): CHOL, HDL, LDLCALC, TRIG, CHOLHDL, LDLDIRECT in the last 72 hours.  Lab Results  Component Value Date   HGBA1C 9.3 (H) 12/13/2020   ------------------------------------------------------------------------------------------------------------------ Recent Labs    02/11/21 0633  TSH 4.528*    ------------------------------------------------------------------------------------------------------------------ Recent Labs    02/11/21 0633  [id number]    Coagulation profile Recent Labs  Lab 02/11/21 0150  INR 1.1    No results for input(s): DDIMER in the last 72 hours.  Cardiac Enzymes No results for input(s): CKMB, TROPONINI, MYOGLOBIN in the last 168 hours.  Invalid input(s): CK ------------------------------------------------------------------------------------------------------------------    Component Value Date/Time   BNP 137.7 (H) 02/13/2021 2376    Micro Results Recent Results (from the past 240 hour(s))  SARS CORONAVIRUS 2 (TAT 6-24 HRS) Nasopharyngeal Nasopharyngeal Swab     Status: None   Collection Time: 02/11/21  3:05 AM   Specimen: Nasopharyngeal Swab  Result Value Ref Range Status   SARS Coronavirus 2 NEGATIVE NEGATIVE Final    Comment: (NOTE) SARS-CoV-2 target nucleic acids are NOT DETECTED.  The SARS-CoV-2 RNA is generally detectable in upper and lower respiratory specimens during the acute phase of infection. Negative results do not preclude SARS-CoV-2 infection, do not rule out co-infections with other pathogens, and should not be used as the sole basis for treatment or other patient management decisions. Negative results must be combined with clinical observations, patient history, and epidemiological information. The expected result is Negative.  Fact Sheet for Patients: SugarRoll.be  Fact Sheet for Healthcare Providers: https://www.woods-mathews.com/  This test is not yet approved or cleared by the [place] FDA and  has been authorized for detection and/or diagnosis of SARS-CoV-2 by FDA under an Emergency Use Authorization (EUA). This EUA will remain  in effect (meaning this test can be used) for the duration of the COVID-19 declaration under Se ction 564(b)(1) of the Act, 21  U.S.C. section 360bbb-3(b)(1), unless the authorization is terminated or revoked sooner.  Performed at [facility], [place] [address], [place], [place] [address]   Culture, blood (Routine X 2) w Reflex to ID Panel     Status: None (Preliminary result)   Collection Time: 02/11/21  4:49 AM   Specimen: BLOOD  Result Value Ref Range Status   Specimen Description BLOOD BLOOD LEFT HAND  Final   Special Requests   Final    BOTTLES DRAWN AEROBIC AND ANAEROBIC Blood Culture adequate volume   Culture  Setup Time   Final    GRAM POSITIVE COCCI AEROBIC BOTTLE ONLY RESULTS PREVIOUSLY CALLED AND VERIFIED    Culture   Final    CULTURE REINCUBATED FOR BETTER GROWTH Performed at [facility], [place] [address] [place], [place] [address]    Report Status PENDING  Incomplete  Culture, blood (Routine X 2) w Reflex to ID Panel     Status: None (Preliminary result)   Collection Time: 02/11/21  4:54 AM   Specimen: BLOOD  Result Value Ref Range Status   Specimen Description BLOOD BLOOD LEFT FOREARM  Final   Special Requests   Final    BOTTLES DRAWN AEROBIC AND ANAEROBIC Blood Culture adequate volume   Culture  Setup Time   Final    GRAM POSITIVE COCCI ANAEROBIC BOTTLE ONLY CRITICAL RESULT CALLED TO, READ BACK BY AND VERIFIED WITH: PHARMD JAMES LEDFORD 02/12/2021 AT 0044 A.HUGHES    Culture   Final    Lonell Grandchild  POSITIVE COCCI CULTURE REINCUBATED FOR BETTER GROWTH Performed at [facility], [place] [address] [place], [place] [address]    Report Status PENDING  Incomplete  Blood Culture ID Panel (Reflexed)     Status: Abnormal   Collection Time: 02/11/21  4:54 AM  Result Value Ref Range Status   Enterococcus faecalis NOT DETECTED NOT DETECTED Final   Enterococcus Faecium NOT DETECTED NOT DETECTED Final   Listeria monocytogenes NOT DETECTED NOT DETECTED Final   Staphylococcus species DETECTED (A) NOT DETECTED Final    Comment: CRITICAL RESULT CALLED TO, READ BACK BY AND VERIFIED  WITH: PHARMD JAMES LEDFORD 02/12/2021 AT 0044 A.HUGHES    Staphylococcus aureus (BCID) NOT DETECTED NOT DETECTED Final   Staphylococcus epidermidis NOT DETECTED NOT DETECTED Final   Staphylococcus lugdunensis NOT DETECTED NOT DETECTED Final   Streptococcus species NOT DETECTED NOT DETECTED Final   Streptococcus agalactiae NOT DETECTED NOT DETECTED Final   Streptococcus pneumoniae NOT DETECTED NOT DETECTED Final   Streptococcus pyogenes NOT DETECTED NOT DETECTED Final   A.calcoaceticus-baumannii NOT DETECTED NOT DETECTED Final   Bacteroides fragilis NOT DETECTED NOT DETECTED Final   Enterobacterales NOT DETECTED NOT DETECTED Final   Enterobacter cloacae complex NOT DETECTED NOT DETECTED Final   Escherichia coli NOT DETECTED NOT DETECTED Final   Klebsiella aerogenes NOT DETECTED NOT DETECTED Final   Klebsiella oxytoca NOT DETECTED NOT DETECTED Final   Klebsiella pneumoniae NOT DETECTED NOT DETECTED Final   Proteus species NOT DETECTED NOT DETECTED Final   Salmonella species NOT DETECTED NOT DETECTED Final   Serratia marcescens NOT DETECTED NOT DETECTED Final   Haemophilus influenzae NOT DETECTED NOT DETECTED Final   Neisseria meningitidis NOT DETECTED NOT DETECTED Final   Pseudomonas aeruginosa NOT DETECTED NOT DETECTED Final   Stenotrophomonas maltophilia NOT DETECTED NOT DETECTED Final   Candida albicans NOT DETECTED NOT DETECTED Final   Candida auris NOT DETECTED NOT DETECTED Final   Candida glabrata NOT DETECTED NOT DETECTED Final   Candida krusei NOT DETECTED NOT DETECTED Final   Candida parapsilosis NOT DETECTED NOT DETECTED Final   Candida tropicalis NOT DETECTED NOT DETECTED Final   Cryptococcus neoformans/gattii NOT DETECTED NOT DETECTED Final    Comment: Performed at [facility], [address], [place], [place] [address]    Radiology Reports DG Chest 2 View  Result Date: 01/25/2021 CLINICAL DATA:  Chest pain EXAM: CHEST - 2 VIEW COMPARISON:  None. FINDINGS: The  heart size and mediastinal contours are within normal limits. Both lungs are clear. The visualized skeletal structures are unremarkable. IMPRESSION: No active cardiopulmonary disease. Electronically Signed   By: Ulyses Jarred M.D.   On: 01/25/2021 02:06   MR BRAIN WO CONTRAST  Result Date: 02/12/2021 CLINICAL DATA:  Altered mental status. Unresponsive with gaze deviation. EXAM: MRI HEAD WITHOUT CONTRAST TECHNIQUE: Multiplanar, multiecho pulse sequences of the brain and surrounding structures were obtained without intravenous contrast. COMPARISON:  Head CT yesterday. FINDINGS: Brain: Diffusion imaging does not show any acute or subacute infarction. Chronic small-vessel ischemic changes are present within the pons. Few old small vessel cerebellar infarctions on the right. Cerebral hemispheres show mild chronic small-vessel ischemic changes of the white matter. No large vessel territory infarction. No mass lesion, hemorrhage, hydrocephalus or extra-axial collection. Vascular: Major vessels at the base of the brain show flow. Skull and upper cervical spine: Negative Sinuses/Orbits: Paranasal sinuses show some opacification in the right ethmoid region. Orbits are negative. Other: Bilateral mastoid effusions, left more extensive than right IMPRESSION:  No acute or subacute infarction. Chronic small-vessel ischemic changes of the pons and cerebral hemispheric white matter. Right ethmoid sinus opacification. Mastoid effusions, left more extensive than right. Electronically Signed   By: Nelson Chimes M.D.   On: 02/12/2021 17:41   DG Chest Port 1 View  Result Date: 02/12/2021 CLINICAL DATA:  Shortness of breath EXAM: PORTABLE CHEST 1 VIEW COMPARISON:  Portable exam 1442 hours compared to 02/11/2021 FINDINGS: Enlargement of cardiac silhouette. Mediastinal contours and pulmonary vascularity normal. Peribronchial thickening with minimal LEFT basilar atelectasis. Remaining lungs clear. No acute infiltrate, pleural effusion or  pneumothorax. Surgical clips RIGHT upper quadrant question cholecystectomy. No acute osseous findings. IMPRESSION: Bronchitic changes with minimal LEFT basilar atelectasis. Enlargement of cardiac silhouette. Electronically Signed   By: Lavonia Dana M.D.   On: 02/12/2021 14:59   DG Chest Port 1 View  Result Date: 02/11/2021 CLINICAL DATA:  Hypoxia. EXAM: PORTABLE CHEST 1 VIEW COMPARISON:  Chest x-ray 01/25/2021, chest x-ray 12/12/2020, CT chest 07/24/2019 FINDINGS: The heart size and mediastinal contours are unchanged. Aortic calcification. Patchy left lower lobe opacities. Similar appearing coarsened interstitial markings with no overt pulmonary edema. Trace left pleural effusion not excluded. No pneumothorax. No acute osseous abnormality. IMPRESSION: Patchy left lower lobe opacities and possible trace left pleural effusion. Finding may represent atelectasis versus infection/inflammation. Consider PA and lateral view of the chest for further evaluation. Electronically Signed   By: Iven Finn M.D.   On: 02/11/2021 02:55   EEG adult  Result Date: 02/12/2021 Lora Havens, MD     02/12/2021  1:52 PM Patient Name: JARELY JUNCAJ MRN: [id number] Epilepsy Attending: Lora Havens Referring Physician/Provider: Dr Harrold Donath Date: 02/12/2021 Duration: 23.50 mins Patient history: 65 year old female presenting to the ED after being found in an awake unresponsive state at home. EEG to evaluate for seizure. Level of alertness: Awake AEDs during EEG study: None Technical aspects: This EEG study was done with scalp electrodes positioned according to the 10-20 International system of electrode placement. Electrical activity was acquired at a sampling rate of 500Hz  and reviewed with a high frequency filter of 70Hz  and a low frequency filter of 1Hz . EEG data were recorded continuously and digitally stored. Description: The posterior dominant rhythm consists of 7 Hz activity of moderate voltage (25-35 uV) seen  predominantly in posterior head regions, symmetric and reactive to eye opening and eye closing. EEG showed continuous generalized 5 to 6 Hz theta as well as intermittent 2-3hz  delta slowing. Generalized periodic discharges with triphasic morphology at  1 Hz were also noted. Hyperventilation and photic stimulation were not performed.   ABNORMALITY - Periodic discharges with triphasic morphology, generalized ( GPDs) - Continuous slow, generalized IMPRESSION: This study is suggestive of moderate diffuse encephalopathy, nonspecific etiology but could be secondary to toxic-metabolic causes. No seizures or definite epileptiform discharges were seen throughout the recording. Lora Havens   CT HEAD CODE STROKE WO CONTRAST  Result Date: 02/11/2021 CLINICAL DATA:  Code stroke.  Acute neurologic deficit EXAM: CT HEAD WITHOUT CONTRAST TECHNIQUE: Contiguous axial images were obtained from the base of the skull through the vertex without intravenous contrast. COMPARISON:  12/12/2020 FINDINGS: Brain: There is no mass, hemorrhage or extra-axial collection. The size and configuration of the ventricles and extra-axial CSF spaces are normal. There is hypoattenuation of the periventricular white matter, most commonly indicating chronic ischemic microangiopathy. Vascular: No abnormal hyperdensity of the major intracranial arteries or dural venous sinuses. No intracranial atherosclerosis. Skull: The visualized skull  base, calvarium and extracranial soft tissues are normal. Sinuses/Orbits: No fluid levels or advanced mucosal thickening of the visualized paranasal sinuses. No mastoid or middle ear effusion. The orbits are normal. ASPECTS [facility] Stroke Program Early CT Score) - Ganglionic level infarction (caudate, lentiform nuclei, internal capsule, insula, M1-M3 cortex): 7 - Supraganglionic infarction (M4-M6 cortex): 3 Total score (0-10 with 10 being normal): 10 IMPRESSION: 1. Chronic ischemic microangiopathy without acute  intracranial abnormality. 2. ASPECTS is 10. 3. These results were communicated to Dr. Kerney Elbe at 2:22 am on 02/11/2021 by text page via the [facility] messaging system. Electronically Signed   By: Ulyses Jarred M.D.   On: 02/11/2021 02:23    Time Spent in minutes  30   Lala Lund M.D on 02/13/2021 at 9:16 AM  To page go to [email or web address]

## 2021-02-13 NOTE — Progress Notes (Signed)
Brief Neurology Update  EEG 02/12/21 showed DS and triphasic discharges which are not epileptogenic and confirm the suspicion of toxic/metabolic encephalopathy. No indication for further EEG monitoring. If her presenting event was in fact a first time seizure, it would be provoked, and in absence of structural abnl on MRI or epileptiform abnl on EEG, AED is not indicated at this time. Per primary team patient's mental status is almost back to baseline after tx UTI with ceftriaxone. Neurology will not continue to actively follow, but please re-engage if additional questions arise.  Su Monks, MD [facility] [phone]  If 7pm- 7am, please page neurology on call as listed in [facility].

## 2021-02-13 NOTE — Evaluation (Signed)
Occupational Therapy Evaluation and Discharge Patient Details Name: [name] MRN: [id number] DOB: 05-04-1956 Today's Date: 02/13/2021    History of Present Illness [name] is a 65 y.o. female, She presented by EMS for evaluation of AMS. Reportedly her husband found her with her eyes open but unresponsive with a rightward gaze, no reported seizure-like activity initial CT head was unremarkable she was seen by neurology and thought to have severe metabolic/toxic encephalopathy and admitted for further work-up. with medical history significant for CKD, DMT2, hypothyroidism, Cirrhosis, TIA, Paroxysmal A-fib not on anticoagulation.   Clinical Impression   This 65 yo female admitted with above presents to acute OT at an overall S level and has this at home from her husband, no further OT needs, we will sign off.    Follow Up Recommendations  No OT follow up;Supervision - Intermittent    Equipment Recommendations  None recommended by OT       Precautions / Restrictions Precautions Precautions: Fall Precaution Comments: Fall risk reduced with use of RW Restrictions Weight Bearing Restrictions: No      Mobility Bed Mobility Overal bed mobility: Modified Independent Bed Mobility: Supine to Sit     Supine to sit: Modified independent (Device/Increase time);HOB elevated          Transfers Overall transfer level: Needs assistance Equipment used: Rolling walker (2 wheeled) Transfers: Sit to/from Stand Sit to Stand: Supervision              Balance Overall balance assessment: Needs assistance   Sitting balance-Leahy Scale: Good Sitting balance - Comments: can sit EOB and reach socks on feet by crossing leg over the other   Standing balance support: No upper extremity supported;During functional activity Standing balance-Leahy Scale: Good Standing balance comment: standing at sink to wash hands                           ADL either performed or  assessed with clinical judgement   ADL                                         General ADL Comments: Overall at a S level from RW level.     Vision Patient Visual Report: No change from baseline              Pertinent Vitals/Pain Pain Assessment: 0-10 Pain Score: 2  Pain Location: low back (chronic) Pain Descriptors / Indicators: Aching;Sore Pain Intervention(s): Limited activity within patient's tolerance;Monitored during session     Hand Dominance Right   Extremity/Trunk Assessment Upper Extremity Assessment Upper Extremity Assessment: Overall WFL for tasks assessed           Communication Communication Communication: No difficulties   Cognition Arousal/Alertness: Awake/alert Behavior During Therapy: WFL for tasks assessed/performed Overall Cognitive Status: Within Functional Limits for tasks assessed                                                Home Living Family/patient expects to be discharged to:: Private residence Living Arrangements: Spouse/significant other Available Help at Discharge: Family;Available 24 hours/day Type of Home: Mobile home Home Access: Ramped entrance     Home Layout: One level     Bathroom  Shower/Tub: Corporate investment banker: Standard Bathroom Accessibility: Yes   Home Equipment: Environmental consultant - 2 wheels;Bedside commode;Shower seat;Wheelchair - manual;Grab bars - tub/shower      Lives With: Spouse    Prior Functioning/Environment Level of Independence: Independent with assistive device(s)  Gait / Transfers Assistance Needed: Uses RW ADL's / Homemaking Assistance Needed: gets down in tub to bath            OT Problem List: Impaired balance (sitting and/or standing)         OT Goals(Current goals can be found in the care plan section) Acute Rehab OT Goals Patient Stated Goal: To get back to being able to get up and walk around on my own when I want to                 AM-PAC OT "6 Clicks" Daily Activity     Outcome Measure Help from another person eating meals?: None Help from another person taking care of personal grooming?: A Little Help from another person toileting, which includes using toliet, bedpan, or urinal?: A Little Help from another person bathing (including washing, rinsing, drying)?: A Little Help from another person to put on and taking off regular upper body clothing?: A Little Help from another person to put on and taking off regular lower body clothing?: A Little 6 Click Score: 19   End of Session Equipment Utilized During Treatment: Gait belt Nurse Communication:  (NT: pt is min guard A with RW to go to toilet in bathroom)  Activity Tolerance: Patient tolerated treatment well Patient left: in chair;with call bell/phone within reach;with chair alarm set  OT Visit Diagnosis: Unsteadiness on feet (R26.81);Pain Pain - part of body:  (low back)                Time: 4035-2481 OT Time Calculation (min): 21 min Charges:  OT General Charges $OT Visit: 1 Visit OT Evaluation $OT Eval Moderate Complexity: [place], OTR/L Acute [employer] Pager [phone] Office [phone]     Almon Register 02/13/2021, 11:33 AM

## 2021-02-14 LAB — CBC WITH DIFFERENTIAL/PLATELET
Abs Immature Granulocytes: 0.01 10*3/uL (ref 0.00–0.07)
Basophils Absolute: 0 10*3/uL (ref 0.0–0.1)
Basophils Relative: 1 %
Eosinophils Absolute: 0.2 10*3/uL (ref 0.0–0.5)
Eosinophils Relative: 5 %
HCT: 36.6 % (ref 36.0–46.0)
Hemoglobin: 11.3 g/dL — ABNORMAL LOW (ref 12.0–15.0)
Immature Granulocytes: 0 %
Lymphocytes Relative: 16 %
Lymphs Abs: 0.6 10*3/uL — ABNORMAL LOW (ref 0.7–4.0)
MCH: 27.7 pg (ref 26.0–34.0)
MCHC: 30.9 g/dL (ref 30.0–36.0)
MCV: 89.7 fL (ref 80.0–100.0)
Monocytes Absolute: 0.2 10*3/uL (ref 0.1–1.0)
Monocytes Relative: 5 %
Neutro Abs: 2.8 10*3/uL (ref 1.7–7.7)
Neutrophils Relative %: 73 %
Platelets: 131 10*3/uL — ABNORMAL LOW (ref 150–400)
RBC: 4.08 MIL/uL (ref 3.87–5.11)
RDW: 15.7 % — ABNORMAL HIGH (ref 11.5–15.5)
WBC: 3.8 10*3/uL — ABNORMAL LOW (ref 4.0–10.5)
nRBC: 0 % (ref 0.0–0.2)

## 2021-02-14 LAB — COMPREHENSIVE METABOLIC PANEL
ALT: 28 U/L (ref 0–44)
AST: 48 U/L — ABNORMAL HIGH (ref 15–41)
Albumin: 3.3 g/dL — ABNORMAL LOW (ref 3.5–5.0)
Alkaline Phosphatase: 127 U/L — ABNORMAL HIGH (ref 38–126)
Anion gap: 7 (ref 5–15)
BUN: 16 mg/dL (ref 8–23)
CO2: 20 mmol/L — ABNORMAL LOW (ref 22–32)
Calcium: 9 mg/dL (ref 8.9–10.3)
Chloride: 110 mmol/L (ref 98–111)
Creatinine, Ser: 1.07 mg/dL — ABNORMAL HIGH (ref 0.44–1.00)
GFR, Estimated: 57 mL/min — ABNORMAL LOW (ref >60)
Glucose, Bld: 117 mg/dL — ABNORMAL HIGH (ref 70–99)
Potassium: 4.1 mmol/L (ref 3.5–5.1)
Sodium: 137 mmol/L (ref 135–145)
Total Bilirubin: 0.9 mg/dL (ref 0.3–1.2)
Total Protein: 6.3 g/dL — ABNORMAL LOW (ref 6.5–8.1)

## 2021-02-14 LAB — MAGNESIUM: Magnesium: 1.9 mg/dL (ref 1.7–2.4)

## 2021-02-14 LAB — GLUCOSE, CAPILLARY
Glucose-Capillary: 128 mg/dL — ABNORMAL HIGH (ref 70–99)
Glucose-Capillary: 211 mg/dL — ABNORMAL HIGH (ref 70–99)
Glucose-Capillary: 104 mg/dL — ABNORMAL HIGH (ref 70–99)
Glucose-Capillary: 124 mg/dL — ABNORMAL HIGH (ref 70–99)

## 2021-02-14 LAB — CULTURE, BLOOD (ROUTINE X 2)
Special Requests: ADEQUATE
Special Requests: ADEQUATE

## 2021-02-14 LAB — AMMONIA: Ammonia: 20 umol/L (ref 9–35)

## 2021-02-14 LAB — BRAIN NATRIURETIC PEPTIDE: B Natriuretic Peptide: 68.6 pg/mL (ref 0.0–100.0)

## 2021-02-14 LAB — PROCALCITONIN: Procalcitonin: <0.1 ng/mL

## 2021-02-14 NOTE — Progress Notes (Signed)
  Speech Language Pathology Treatment: Dysphagia;Cognitive-Linquistic  Patient Details Name: [name] MRN: [id number] DOB: 09/09/1956 Today's Date: 02/14/2021 Time: 4975-3005 SLP Time Calculation (min) (ACUTE ONLY): 16 min  Assessment / Plan / Recommendation Clinical Impression  Pt seen during am meal to assess diet tolerance post advancement on 02/13/21. Meal tray consisting of dysphagia 3 solids and thin liquids via straw consumed without overt s/sx of aspiration. Edentulous status did not appear to impair pt's ability to masticate and orally clear solids. She states that she eats regular textured food items at home, however prefers to remain on dysphagia 3 diet at this time. RN reports no difficulty with PO intake since diet upgrade.   Improvement in cognitive function noted this date, with pt able to recall orientation information related to self, time and place. She remains disoriented to situation/reason for hospitalization. Safety/ problem solving continues to remain poor and when provided with real life safety scenarios, pt provided solution to problem with 60% accuracy given mod verbal/question cues. No further needs identified in regards to dysphagia. SLP to follow for cognitive treatment only.   HPI HPI: Patient is a 65 y.o. female with PMH: CKD, DM-2, hypothyroidism, cirrhosis, TIA, paroxysmal a-fib not on anticoagulation, anxiety, GERD, hiatal hernia, HTN, CVA (with minimal left sided weakness) who presented to hospital via EMS for evaluation of AMS. Husband reportedly found her with eyes open but unresponsive and with rightward gaze. CT negative for acute pathology. CXR revealed Patchy left lower lobe opacities and possible trace left pleural effusion. Finding may represent atelectasis versus  infection/inflammation. SLP evaluation ordered for bedside swallow eval as well as ?slurring of speech. EEG 6/5: moderate diffuse encephalopathy, nonspecific etiology but could be secondary to  toxic-metabolic causes. No seizures or definite epileptiform discharges      SLP Plan  Continue with current plan of care;Other (Comment) (follow for cog only, dysphagia goals met)       Recommendations  Diet recommendations: Dysphagia 3 (mechanical soft);Thin liquid Liquids provided via: Straw Medication Administration: Whole meds with liquid Supervision: Intermittent supervision to cue for compensatory strategies Compensations: Minimize environmental distractions;Slow rate;Small sips/bites Postural Changes and/or Swallow Maneuvers: Seated upright 90 degrees                Oral Care Recommendations: Oral care BID;Staff/trained caregiver to provide oral care Follow up Recommendations: None SLP Visit Diagnosis: Dysphagia, unspecified (R13.10);Cognitive communication deficit (R41.841) Plan: Continue with current plan of care;Other (Comment) (follow for cog only, dysphagia goals met)       [place], [place], [place] Office Number: [place] 02/14/2021, 8:59 AM

## 2021-02-14 NOTE — Progress Notes (Signed)
Physical Therapy Treatment Patient Details Name: [name] MRN: [id number] DOB: December 08, 1955 Today's Date: 02/14/2021    History of Present Illness [name] is a 65 y.o. female, She presented by EMS for evaluation of AMS. Reportedly her husband found her with her eyes open but unresponsive with a rightward gaze, no reported seizure-like activity initial CT head was unremarkable she was seen by neurology and thought to have severe metabolic/toxic encephalopathy and admitted for further work-up. with medical history significant for CKD, DMT2, hypothyroidism, Cirrhosis, TIA, Paroxysmal A-fib not on anticoagulation.    PT Comments    Pt asleep on entry, increased effort to rouse. Pt found to be incontinent of stool and unaware. Pt is supervision for bed mobility while PT provided pericare. Pt min guard for transfers and ambulation with RW. D/c plans remain appropriate at this time. PT will continue to follow acutely.     Follow Up Recommendations  Home health PT;Supervision/Assistance - 24 hour     Equipment Recommendations  Rolling walker with 5" wheels;3in1 (PT)       Precautions / Restrictions Precautions Precautions: Fall Precaution Comments: Fall risk reduced with use of RW Restrictions Weight Bearing Restrictions: No    Mobility  Bed Mobility Overal bed mobility: Needs Assistance Bed Mobility: Supine to Sit;Rolling Rolling: Supervision   Supine to sit: Supervision     General bed mobility comments: supervision for safety    Transfers Overall transfer level: Needs assistance Equipment used: Rolling walker (2 wheeled) Transfers: Sit to/from Stand Sit to Stand: Min guard         General transfer comment: Min assit to steady at initial stand  Ambulation/Gait Ambulation/Gait assistance: Min guard   Assistive device: Rolling walker (2 wheeled) Gait Pattern/deviations: Step-through pattern Gait velocity: slowed Gait velocity interpretation: <1.8 ft/sec,  indicate of risk for recurrent falls General Gait Details: had to ask activity tolerance, poor insight into endurance level         Balance Overall balance assessment: Needs assistance   Sitting balance-Leahy Scale: Good       Standing balance-Leahy Scale: Poor Standing balance comment: requires UE assist                            Cognition Arousal/Alertness: Awake/alert Behavior During Therapy: WFL for tasks assessed/performed (for simple mobility tasks) Overall Cognitive Status: No family/caregiver present to determine baseline cognitive functioning                                 General Comments: Pleasant and following all commands related to mobility, unaware she has been incontinent of stool         General Comments General comments (skin integrity, edema, etc.): VSS      Pertinent Vitals/Pain Pain Assessment: No/denies pain           PT Goals (current goals can now be found in the care plan section) Acute Rehab PT Goals Patient Stated Goal: Hopes to be home soon PT Goal Formulation: With patient Time For Goal Achievement: 02/26/21 Potential to Achieve Goals: Good Progress towards PT goals: Progressing toward goals    Frequency    Min 3X/week      PT Plan Current plan remains appropriate       AM-PAC PT "6 Clicks" Mobility   Outcome Measure  Help needed turning from your back to your side while in a flat  bed without using bedrails?: A Little Help needed moving from lying on your back to sitting on the side of a flat bed without using bedrails?: A Little Help needed moving to and from a bed to a chair (including a wheelchair)?: A Little Help needed standing up from a chair using your arms (e.g., wheelchair or bedside chair)?: A Little Help needed to walk in hospital room?: A Little Help needed climbing 3-5 steps with a railing? : A Lot 6 Click Score: 17    End of Session Equipment Utilized During Treatment: Gait  belt Activity Tolerance: Patient tolerated treatment well Patient left: with call bell/phone within reach;in chair;with chair alarm set Nurse Communication: Mobility status (incontinence of stool) PT Visit Diagnosis: Unsteadiness on feet (R26.81);History of falling (Z91.81)     Time: 7948-0165 PT Time Calculation (min) (ACUTE ONLY): 23 min  Charges:  $Gait Training: 8-22 mins $Therapeutic Activity: 8-22 mins                     Huxley Shurley B. Migdalia Dk PT, DPT Acute Rehabilitation Services Pager [phone] Office [phone]    [place] 02/14/2021, 5:09 PM

## 2021-02-14 NOTE — Progress Notes (Signed)
PROGRESS NOTE                                                                                                                                                                                                             Patient Demographics:    [name], is a 65 y.o. female, DOB - 07/10/1956, XLK:[id number]  Admit date - 02/11/2021   Admitting Physician Eben Burow, MD  Outpatient Primary MD for the patient is [name], NP  LOS - 3  Chief Complaint  Patient presents with  . Code Stroke       Brief Narrative (HPI from H&P)   [name] is a 65 y.o. female with medical history significant for CKD, DMT2, hypothyroidism, Cirrhosis, TIA, Paroxysmal A-fib not on anticoagulation. She presented by EMS for evaluation of AMS. Reportedly her husband found her with her eyes open but unresponsive with a rightward gaze, no reported seizure-like activity initial CT head was unremarkable she was seen by neurology and thought to have severe metabolic/toxic encephalopathy and admitted for further work-up.   Subjective:   Patient in bed, appears comfortable, denies any headache, no fever, no chest pain or pressure, no shortness of breath , no abdominal pain. No new focal weakness.   Assessment  & Plan :     1. AMS - per when the episode happened suddenly, nothing like this is happened before, question if she had an episode of seizure, CT head, MRI brain and EEG nonacute, also treated with lactulose for elevated ammonia levels, mentation now close to baseline no focal deficits or headache, continue supportive care PT OT.  Continue bowel regimen to keep ammonia levels low, if stable likely discharge in the next 2 to 3 days.  Question if mental status change was due to toxic and metabolic encephalopathy caused by combination of UTI/CAP elevated ammonia levels.  2. UTI/CAP - on combination of Rocephin and doxycycline and monitor.  3. High Ammonia - no history of liver  issues, was constipated, was placed on lactulose along with bowel regimen, much improved. Continue Miralax.  4. HTN - placed on Norvasc and added Coreg.  5. AKI on CKD 3 A - improved with hydration.  6.  Low B12 levels.  Placed on supplementation.    7. DM2 - poor outpatient control due to hyperglycemia, based on Lantus and adjusted sliding scale, added Premeal NovoLog on 02/13/2021.  I have requested Insulin and diabetic education.  Lab Results  Component Value Date  HGBA1C 9.3 (H) 12/13/2020   CBG (last 3)  Recent Labs    02/13/21 2208 02/14/21 0635 02/14/21 1141  GLUCAP 91 128* 211*      Condition - Fair  Family Communication  :  Verdis Frederickson [phone] on 02/12/21  Code Status :  Full  Consults  :  Neuro, ID Dr Linus Salmons - 02/13/21 -phone  Procedures  :    CT Head - Non acute  MRI Brain - Non Acute  EEG - Non Acute  PUD Prophylaxis : none  Disposition Plan  :    Status is: Inpatient  Remains inpatient appropriate because:IV treatments appropriate due to intensity of illness or inability to take PO   Dispo: The patient is from: Home              Anticipated d/c is to: Home              Patient currently is not medically stable to d/c.   Difficult to place patient No  DVT Prophylaxis  :   heparin injection 5,000 Units Start: 02/11/21 0615     Lab Results  Component Value Date   PLT 131 (L) 02/14/2021    Diet :  Diet Order            DIET DYS 3 Room service appropriate? Yes with Assist; Fluid consistency: Thin  Diet effective now                  Inpatient Medications Scheduled Meds: . amLODipine  10 mg Oral Daily  . carvedilol  3.125 mg Oral BID WC  . doxycycline  100 mg Oral Q12H  . heparin  5,000 Units Subcutaneous Q8H  . insulin aspart  0-15 Units Subcutaneous TID WC  . insulin aspart  0-5 Units Subcutaneous QHS  . insulin aspart  3 Units Subcutaneous TID WC  . insulin glargine  25 Units Subcutaneous Daily  . polyethylene glycol  17  g Oral BID   Continuous Infusions: . cefTRIAXone (ROCEPHIN)  IV 1 g (02/14/21 0846)   PRN Meds:.acetaminophen **OR** [DISCONTINUED] acetaminophen, albuterol, haloperidol lactate, traMADol  Antibiotics  :   Anti-infectives (From admission, onward)   Start     Dose/Rate Route Frequency Ordered Stop   02/13/21 2200  doxycycline (VIBRA-TABS) tablet 100 mg        100 mg Oral Every 12 hours 02/13/21 0922     02/12/21 0800  cefTRIAXone (ROCEPHIN) 1 g in sodium chloride 0.9 % 100 mL IVPB        1 g 200 mL/hr over 30 Minutes Intravenous Every 24 hours 02/11/21 0600     02/11/21 0800  doxycycline (VIBRAMYCIN) 100 mg in sodium chloride 0.9 % 250 mL IVPB  Status:  Discontinued        100 mg 125 mL/hr over 120 Minutes Intravenous Every 12 hours 02/11/21 0600 02/13/21 0922   02/11/21 0500  cefTRIAXone (ROCEPHIN) 2 g in sodium chloride 0.9 % 100 mL IVPB        2 g 200 mL/hr over 30 Minutes Intravenous  Once 02/11/21 0448 02/11/21 0545          Objective:   Vitals:   02/13/21 2044 02/14/21 0100 02/14/21 0505 02/14/21 0945  BP: 124/65  (!) 181/78 (!) 144/70  Pulse: 70  78 100  Resp: 16  16 17   Temp: 98.4 F (36.9 C)  97.9 F (36.6 C) 98.2 F (36.8 C)  TempSrc: Oral Oral Oral Oral  SpO2: 98%  100% 98%  Weight:      Height:        SpO2: 98 % O2 Flow Rate (L/min): 2 L/min  Wt Readings from Last 3 Encounters:  02/12/21 72.4 kg  01/25/21 72.1 kg  01/11/21 72.1 kg     Intake/Output Summary (Last 24 hours) at 02/14/2021 1205 Last data filed at 02/14/2021 0800 Gross per 24 hour  Intake 840 ml  Output --  Net 840 ml     Physical Exam  Awake Alert, No new F.N deficits, Normal affect [place].AT,PERRAL Supple Neck,No JVD, No cervical lymphadenopathy appriciated.  Symmetrical Chest wall movement, Good air movement bilaterally, CTAB RRR,No Gallops, Rubs or new Murmurs, No Parasternal Heave +ve B.Sounds, Abd Soft, No tenderness, No organomegaly appriciated, No rebound - guarding or  rigidity. No Cyanosis, Clubbing or edema, No new Rash or bruise    Data Review:   Recent Labs  Lab 02/11/21 0150 02/11/21 0204 02/11/21 0601 02/12/21 0227 02/12/21 0831 02/13/21 0632 02/14/21 0243  WBC 7.8  --  7.4 4.9 4.9 4.4 3.8*  HGB 10.9*   < > 9.7* 9.1* 9.6* 9.6* 11.3*  HCT 36.0   < > 32.3* 30.1* 30.9* 30.3* 36.6  PLT 151  --  126* 117* 127* 127* 131*  MCV 92.8  --  93.6 91.5 89.8 88.1 89.7  MCH 28.1  --  28.1 27.7 27.9 27.9 27.7  MCHC 30.3  --  30.0 30.2 31.1 31.7 30.9  RDW 15.9*  --  15.9* 15.8* 15.7* 15.6* 15.7*  LYMPHSABS 0.8  --   --   --   --  0.6* 0.6*  MONOABS 0.4  --   --   --   --  0.2 0.2  EOSABS 0.1  --   --   --   --  0.2 0.2  BASOSABS 0.0  --   --   --   --  0.0 0.0   < > = values in this interval not displayed.    Recent Labs  Lab 02/11/21 0150 02/11/21 0204 02/11/21 0601 02/11/21 0998 02/12/21 0227 02/12/21 0831 02/13/21 0632 02/13/21 0950 02/14/21 0243  NA 138 139  --   --  140 137 136  --  137  K 4.6 4.7  --   --  3.9 3.8 3.4*  --  4.1  CL 107 108  --   --  110 107 108  --  110  CO2 20*  --   --   --  20* 20* 21*  --  20*  GLUCOSE 253* 246*  --   --  194* 249* 344*  --  117*  BUN 20 28*  --   --  22 19 12   --  16  CREATININE 2.58* 2.60* 2.56*  --  1.93* 1.63* 1.17*  --  1.07*  CALCIUM 8.5*  --   --   --  8.1* 8.2* 8.5*  --  9.0  AST 44*  --   --   --   --   --  33  --  48*  ALT 24  --   --   --   --   --  35  --  28  ALKPHOS 123  --   --   --   --   --  114  --  127*  BILITOT 0.7  --   --   --   --   --  0.8  --  0.9  ALBUMIN 3.5  --   --   --   --   --  3.1*  --  3.3*  MG  --   --   --   --   --  1.7 1.6*  --  1.9  PROCALCITON  --   --   --   --   --   --   --  <0.10 <0.10  INR 1.1  --   --   --   --   --   --   --   --   TSH  --   --   --  4.528*  --   --   --   --   --   AMMONIA  --   --   --  50*  --  63* 32  --  20  BNP  --   --   --   --   --  147.6* 137.7*  --  68.6    Recent Labs  Lab 02/11/21 0305 02/12/21 0831  02/13/21 0632 02/13/21 0950 02/14/21 0243  BNP  --  147.6* 137.7*  --  68.6  PROCALCITON  --   --   --  <0.10 <0.10  SARSCOV2NAA NEGATIVE  --   --   --   --     ------------------------------------------------------------------------------------------------------------------ No results for input(s): CHOL, HDL, LDLCALC, TRIG, CHOLHDL, LDLDIRECT in the last 72 hours.  Lab Results  Component Value Date   HGBA1C 9.3 (H) 12/13/2020   ------------------------------------------------------------------------------------------------------------------ No results for input(s): TSH, T4TOTAL, T3FREE, THYROIDAB in the last 72 hours.  Invalid input(s): FREET3 ------------------------------------------------------------------------------------------------------------------ No results for input(s): VITAMINB12, FOLATE, FERRITIN, TIBC, IRON, RETICCTPCT in the last 72 hours.  Coagulation profile Recent Labs  Lab 02/11/21 0150  INR 1.1    No results for input(s): DDIMER in the last 72 hours.  Cardiac Enzymes No results for input(s): CKMB, TROPONINI, MYOGLOBIN in the last 168 hours.  Invalid input(s): CK ------------------------------------------------------------------------------------------------------------------    Component Value Date/Time   BNP 68.6 02/14/2021 0243    Micro Results Recent Results (from the past 240 hour(s))  SARS CORONAVIRUS 2 (TAT 6-24 HRS) Nasopharyngeal Nasopharyngeal Swab     Status: None   Collection Time: 02/11/21  3:05 AM   Specimen: Nasopharyngeal Swab  Result Value Ref Range Status   SARS Coronavirus 2 NEGATIVE NEGATIVE Final    Comment: (NOTE) SARS-CoV-2 target nucleic acids are NOT DETECTED.  The SARS-CoV-2 RNA is generally detectable in upper and lower respiratory specimens during the acute phase of infection. Negative results do not preclude SARS-CoV-2 infection, do not rule out co-infections with other pathogens, and should not be used as  the sole basis for treatment or other patient management decisions. Negative results must be combined with clinical observations, patient history, and epidemiological information. The expected result is Negative.  Fact Sheet for Patients: SugarRoll.be  Fact Sheet for Healthcare Providers: https://www.woods-mathews.com/  This test is not yet approved or cleared by the [place] FDA and  has been authorized for detection and/or diagnosis of SARS-CoV-2 by FDA under an Emergency Use Authorization (EUA). This EUA will remain  in effect (meaning this test can be used) for the duration of the COVID-19 declaration under Se ction 564(b)(1) of the Act, 21 U.S.C. section 360bbb-3(b)(1), unless the authorization is terminated or revoked sooner.  Performed at [facility], [place] [address] [place], [place] [address]   Culture, blood (Routine X 2) w Reflex to ID Panel     Status: Abnormal   Collection Time: 02/11/21  4:49 AM   Specimen: BLOOD  Result  Value Ref Range Status   Specimen Description BLOOD BLOOD LEFT HAND  Final   Special Requests   Final    BOTTLES DRAWN AEROBIC AND ANAEROBIC Blood Culture adequate volume   Culture  Setup Time   Final    GRAM POSITIVE COCCI AEROBIC BOTTLE ONLY RESULTS PREVIOUSLY CALLED AND VERIFIED Performed at [facility], [place] [address] [place], [place] [address]    Culture STAPHYLOCOCCUS HOMINIS (A)  Final   Report Status 02/14/2021 FINAL  Final   Organism ID, Bacteria STAPHYLOCOCCUS HOMINIS  Final      Susceptibility   Staphylococcus hominis - MIC*    CIPROFLOXACIN 1 SENSITIVE Sensitive     ERYTHROMYCIN <=0.25 SENSITIVE Sensitive     GENTAMICIN <=0.5 SENSITIVE Sensitive     OXACILLIN RESISTANT Resistant     TETRACYCLINE <=1 SENSITIVE Sensitive     VANCOMYCIN <=0.5 SENSITIVE Sensitive     TRIMETH/SULFA 20 SENSITIVE Sensitive     CLINDAMYCIN <=0.25 SENSITIVE Sensitive     RIFAMPIN <=0.5  SENSITIVE Sensitive     Inducible Clindamycin NEGATIVE Sensitive     * STAPHYLOCOCCUS HOMINIS  Culture, blood (Routine X 2) w Reflex to ID Panel     Status: Abnormal   Collection Time: 02/11/21  4:54 AM   Specimen: BLOOD  Result Value Ref Range Status   Specimen Description BLOOD BLOOD LEFT FOREARM  Final   Special Requests   Final    BOTTLES DRAWN AEROBIC AND ANAEROBIC Blood Culture adequate volume   Culture  Setup Time   Final    GRAM POSITIVE COCCI ANAEROBIC BOTTLE ONLY CRITICAL RESULT CALLED TO, READ BACK BY AND VERIFIED WITH: PHARMD JAMES LEDFORD 02/12/2021 AT 0044 A.HUGHES    Culture (A)  Final    STAPHYLOCOCCUS HOMINIS SUSCEPTIBILITIES PERFORMED ON PREVIOUS CULTURE WITHIN THE LAST 5 DAYS. Performed at [facility], [place] [address], [place], [place] [address]    Report Status 02/14/2021 FINAL  Final  Blood Culture ID Panel (Reflexed)     Status: Abnormal   Collection Time: 02/11/21  4:54 AM  Result Value Ref Range Status   Enterococcus faecalis NOT DETECTED NOT DETECTED Final   Enterococcus Faecium NOT DETECTED NOT DETECTED Final   Listeria monocytogenes NOT DETECTED NOT DETECTED Final   Staphylococcus species DETECTED (A) NOT DETECTED Final    Comment: CRITICAL RESULT CALLED TO, READ BACK BY AND VERIFIED WITH: PHARMD JAMES LEDFORD 02/12/2021 AT 0044 A.HUGHES    Staphylococcus aureus (BCID) NOT DETECTED NOT DETECTED Final   Staphylococcus epidermidis NOT DETECTED NOT DETECTED Final   Staphylococcus lugdunensis NOT DETECTED NOT DETECTED Final   Streptococcus species NOT DETECTED NOT DETECTED Final   Streptococcus agalactiae NOT DETECTED NOT DETECTED Final   Streptococcus pneumoniae NOT DETECTED NOT DETECTED Final   Streptococcus pyogenes NOT DETECTED NOT DETECTED Final   A.calcoaceticus-baumannii NOT DETECTED NOT DETECTED Final   Bacteroides fragilis NOT DETECTED NOT DETECTED Final   Enterobacterales NOT DETECTED NOT DETECTED Final   Enterobacter cloacae complex  NOT DETECTED NOT DETECTED Final   Escherichia coli NOT DETECTED NOT DETECTED Final   Klebsiella aerogenes NOT DETECTED NOT DETECTED Final   Klebsiella oxytoca NOT DETECTED NOT DETECTED Final   Klebsiella pneumoniae NOT DETECTED NOT DETECTED Final   Proteus species NOT DETECTED NOT DETECTED Final   Salmonella species NOT DETECTED NOT DETECTED Final   Serratia marcescens NOT DETECTED NOT DETECTED Final   Haemophilus influenzae NOT DETECTED NOT DETECTED Final   Neisseria meningitidis NOT DETECTED NOT DETECTED  Final   Pseudomonas aeruginosa NOT DETECTED NOT DETECTED Final   Stenotrophomonas maltophilia NOT DETECTED NOT DETECTED Final   Candida albicans NOT DETECTED NOT DETECTED Final   Candida auris NOT DETECTED NOT DETECTED Final   Candida glabrata NOT DETECTED NOT DETECTED Final   Candida krusei NOT DETECTED NOT DETECTED Final   Candida parapsilosis NOT DETECTED NOT DETECTED Final   Candida tropicalis NOT DETECTED NOT DETECTED Final   Cryptococcus neoformans/gattii NOT DETECTED NOT DETECTED Final    Comment: Performed at [facility], [place] [address], [place], [place] [address]    Radiology Reports DG Chest 2 View  Result Date: 01/25/2021 CLINICAL DATA:  Chest pain EXAM: CHEST - 2 VIEW COMPARISON:  None. FINDINGS: The heart size and mediastinal contours are within normal limits. Both lungs are clear. The visualized skeletal structures are unremarkable. IMPRESSION: No active cardiopulmonary disease. Electronically Signed   By: Ulyses Jarred M.D.   On: 01/25/2021 02:06   MR BRAIN WO CONTRAST  Result Date: 02/12/2021 CLINICAL DATA:  Altered mental status. Unresponsive with gaze deviation. EXAM: MRI HEAD WITHOUT CONTRAST TECHNIQUE: Multiplanar, multiecho pulse sequences of the brain and surrounding structures were obtained without intravenous contrast. COMPARISON:  Head CT yesterday. FINDINGS: Brain: Diffusion imaging does not show any acute or subacute infarction. Chronic small-vessel  ischemic changes are present within the pons. Few old small vessel cerebellar infarctions on the right. Cerebral hemispheres show mild chronic small-vessel ischemic changes of the white matter. No large vessel territory infarction. No mass lesion, hemorrhage, hydrocephalus or extra-axial collection. Vascular: Major vessels at the base of the brain show flow. Skull and upper cervical spine: Negative Sinuses/Orbits: Paranasal sinuses show some opacification in the right ethmoid region. Orbits are negative. Other: Bilateral mastoid effusions, left more extensive than right IMPRESSION: No acute or subacute infarction. Chronic small-vessel ischemic changes of the pons and cerebral hemispheric white matter. Right ethmoid sinus opacification. Mastoid effusions, left more extensive than right. Electronically Signed   By: Nelson Chimes M.D.   On: 02/12/2021 17:41   DG Chest Port 1 View  Result Date: 02/12/2021 CLINICAL DATA:  Shortness of breath EXAM: PORTABLE CHEST 1 VIEW COMPARISON:  Portable exam 1442 hours compared to 02/11/2021 FINDINGS: Enlargement of cardiac silhouette. Mediastinal contours and pulmonary vascularity normal. Peribronchial thickening with minimal LEFT basilar atelectasis. Remaining lungs clear. No acute infiltrate, pleural effusion or pneumothorax. Surgical clips RIGHT upper quadrant question cholecystectomy. No acute osseous findings. IMPRESSION: Bronchitic changes with minimal LEFT basilar atelectasis. Enlargement of cardiac silhouette. Electronically Signed   By: Lavonia Dana M.D.   On: 02/12/2021 14:59   DG Chest Port 1 View  Result Date: 02/11/2021 CLINICAL DATA:  Hypoxia. EXAM: PORTABLE CHEST 1 VIEW COMPARISON:  Chest x-ray 01/25/2021, chest x-ray 12/12/2020, CT chest 07/24/2019 FINDINGS: The heart size and mediastinal contours are unchanged. Aortic calcification. Patchy left lower lobe opacities. Similar appearing coarsened interstitial markings with no overt pulmonary edema. Trace left  pleural effusion not excluded. No pneumothorax. No acute osseous abnormality. IMPRESSION: Patchy left lower lobe opacities and possible trace left pleural effusion. Finding may represent atelectasis versus infection/inflammation. Consider PA and lateral view of the chest for further evaluation. Electronically Signed   By: Iven Finn M.D.   On: 02/11/2021 02:55   EEG adult  Result Date: 02/12/2021 Lora Havens, MD     02/12/2021  1:52 PM Patient Name: VICENTA OLDS MRN: [id number] Epilepsy Attending: Lora Havens Referring Physician/Provider: Dr Harrold Donath Date: 02/12/2021 Duration: 23.50  mins Patient history: 65 year old female presenting to the ED after being found in an awake unresponsive state at home. EEG to evaluate for seizure. Level of alertness: Awake AEDs during EEG study: None Technical aspects: This EEG study was done with scalp electrodes positioned according to the 10-20 International system of electrode placement. Electrical activity was acquired at a sampling rate of 500Hz  and reviewed with a high frequency filter of 70Hz  and a low frequency filter of 1Hz . EEG data were recorded continuously and digitally stored. Description: The posterior dominant rhythm consists of 7 Hz activity of moderate voltage (25-35 uV) seen predominantly in posterior head regions, symmetric and reactive to eye opening and eye closing. EEG showed continuous generalized 5 to 6 Hz theta as well as intermittent 2-3hz  delta slowing. Generalized periodic discharges with triphasic morphology at  1 Hz were also noted. Hyperventilation and photic stimulation were not performed.   ABNORMALITY - Periodic discharges with triphasic morphology, generalized ( GPDs) - Continuous slow, generalized IMPRESSION: This study is suggestive of moderate diffuse encephalopathy, nonspecific etiology but could be secondary to toxic-metabolic causes. No seizures or definite epileptiform discharges were seen throughout the recording.  Lora Havens   CT HEAD CODE STROKE WO CONTRAST  Result Date: 02/11/2021 CLINICAL DATA:  Code stroke.  Acute neurologic deficit EXAM: CT HEAD WITHOUT CONTRAST TECHNIQUE: Contiguous axial images were obtained from the base of the skull through the vertex without intravenous contrast. COMPARISON:  12/12/2020 FINDINGS: Brain: There is no mass, hemorrhage or extra-axial collection. The size and configuration of the ventricles and extra-axial CSF spaces are normal. There is hypoattenuation of the periventricular white matter, most commonly indicating chronic ischemic microangiopathy. Vascular: No abnormal hyperdensity of the major intracranial arteries or dural venous sinuses. No intracranial atherosclerosis. Skull: The visualized skull base, calvarium and extracranial soft tissues are normal. Sinuses/Orbits: No fluid levels or advanced mucosal thickening of the visualized paranasal sinuses. No mastoid or middle ear effusion. The orbits are normal. ASPECTS [facility] Stroke Program Early CT Score) - Ganglionic level infarction (caudate, lentiform nuclei, internal capsule, insula, M1-M3 cortex): 7 - Supraganglionic infarction (M4-M6 cortex): 3 Total score (0-10 with 10 being normal): 10 IMPRESSION: 1. Chronic ischemic microangiopathy without acute intracranial abnormality. 2. ASPECTS is 10. 3. These results were communicated to Dr. Kerney Elbe at 2:22 am on 02/11/2021 by text page via the [facility] messaging system. Electronically Signed   By: Ulyses Jarred M.D.   On: 02/11/2021 02:23    Time Spent in minutes  30   Lala Lund M.D on 02/14/2021 at 12:05 PM  To page go to [email or web address]

## 2021-02-15 LAB — COMPREHENSIVE METABOLIC PANEL
ALT: 27 U/L (ref 0–44)
AST: 26 U/L (ref 15–41)
Albumin: 3 g/dL — ABNORMAL LOW (ref 3.5–5.0)
Alkaline Phosphatase: 117 U/L (ref 38–126)
Anion gap: 10 (ref 5–15)
BUN: 16 mg/dL (ref 8–23)
CO2: 20 mmol/L — ABNORMAL LOW (ref 22–32)
Calcium: 8.6 mg/dL — ABNORMAL LOW (ref 8.9–10.3)
Chloride: 107 mmol/L (ref 98–111)
Creatinine, Ser: 1.07 mg/dL — ABNORMAL HIGH (ref 0.44–1.00)
GFR, Estimated: 57 mL/min — ABNORMAL LOW (ref >60)
Glucose, Bld: 118 mg/dL — ABNORMAL HIGH (ref 70–99)
Potassium: 3.9 mmol/L (ref 3.5–5.1)
Sodium: 137 mmol/L (ref 135–145)
Total Bilirubin: 0.6 mg/dL (ref 0.3–1.2)
Total Protein: 6 g/dL — ABNORMAL LOW (ref 6.5–8.1)

## 2021-02-15 LAB — CBC WITH DIFFERENTIAL/PLATELET
Abs Immature Granulocytes: 0.02 10*3/uL (ref 0.00–0.07)
Basophils Absolute: 0 10*3/uL (ref 0.0–0.1)
Basophils Relative: 0 %
Eosinophils Absolute: 0.2 10*3/uL (ref 0.0–0.5)
Eosinophils Relative: 6 %
HCT: 33.2 % — ABNORMAL LOW (ref 36.0–46.0)
Hemoglobin: 10.4 g/dL — ABNORMAL LOW (ref 12.0–15.0)
Immature Granulocytes: 1 %
Lymphocytes Relative: 19 %
Lymphs Abs: 0.7 10*3/uL (ref 0.7–4.0)
MCH: 27.9 pg (ref 26.0–34.0)
MCHC: 31.3 g/dL (ref 30.0–36.0)
MCV: 89 fL (ref 80.0–100.0)
Monocytes Absolute: 0.2 10*3/uL (ref 0.1–1.0)
Monocytes Relative: 5 %
Neutro Abs: 2.8 10*3/uL (ref 1.7–7.7)
Neutrophils Relative %: 69 %
Platelets: 113 10*3/uL — ABNORMAL LOW (ref 150–400)
RBC: 3.73 MIL/uL — ABNORMAL LOW (ref 3.87–5.11)
RDW: 15.9 % — ABNORMAL HIGH (ref 11.5–15.5)
WBC: 4 10*3/uL (ref 4.0–10.5)
nRBC: 0 % (ref 0.0–0.2)

## 2021-02-15 LAB — MAGNESIUM: Magnesium: 1.6 mg/dL — ABNORMAL LOW (ref 1.7–2.4)

## 2021-02-15 LAB — AMMONIA: Ammonia: 25 umol/L (ref 9–35)

## 2021-02-15 LAB — GLUCOSE, CAPILLARY
Glucose-Capillary: 206 mg/dL — ABNORMAL HIGH (ref 70–99)
Glucose-Capillary: 228 mg/dL — ABNORMAL HIGH (ref 70–99)
Glucose-Capillary: 149 mg/dL — ABNORMAL HIGH (ref 70–99)
Glucose-Capillary: 99 mg/dL (ref 70–99)

## 2021-02-15 LAB — PROCALCITONIN: Procalcitonin: <0.1 ng/mL

## 2021-02-15 LAB — BRAIN NATRIURETIC PEPTIDE: B Natriuretic Peptide: 56.6 pg/mL (ref 0.0–100.0)

## 2021-02-15 MED ORDER — MAGNESIUM SULFATE 2 GM/50ML IV SOLN
2.0000 g | Freq: Once | INTRAVENOUS | Status: AC
  Administered 2021-02-15: 2 g via INTRAVENOUS
  Filled 2021-02-15: qty 50

## 2021-02-15 MED ORDER — DOXYCYCLINE HYCLATE 100 MG PO TABS: 100.0000 mg | ORAL_TABLET | Freq: Two times a day (BID) | ORAL | 0 refills | Status: DC

## 2021-02-15 MED ORDER — POLYETHYLENE GLYCOL 3350 17 G PO PACK: 17.0000 g | PACK | Freq: Two times a day (BID) | ORAL | 0 refills | Status: DC

## 2021-02-15 MED ORDER — CARVEDILOL 3.125 MG PO TABS: 3.1250 mg | ORAL_TABLET | Freq: Two times a day (BID) | ORAL | 0 refills | Status: DC

## 2021-02-15 MED ORDER — "INSULIN SYRINGE-NEEDLE U-100 25G X 1"" 1 ML MISC": 0 refills | Status: AC

## 2021-02-15 MED ORDER — INSULIN LISPRO (1 UNIT DIAL) 100 UNIT/ML (KWIKPEN): PEN_INJECTOR | SUBCUTANEOUS | 11 refills | Status: DC

## 2021-02-15 MED ORDER — AMLODIPINE BESYLATE 10 MG PO TABS: 10.0000 mg | ORAL_TABLET | Freq: Every day | ORAL | 0 refills | Status: DC

## 2021-02-15 MED ORDER — DOCUSATE SODIUM 100 MG PO CAPS: 100.0000 mg | ORAL_CAPSULE | Freq: Every day | ORAL | 0 refills | Status: AC

## 2021-02-15 NOTE — Plan of Care (Signed)
  Problem: Health Behavior/Discharge Planning: Goal: Ability to manage health-related needs will improve Outcome: Completed/Met   Problem: Activity: Goal: Risk for activity intolerance will decrease Outcome: Completed/Met   Problem: Nutrition: Goal: Adequate nutrition will be maintained Outcome: Completed/Met   Problem: Coping: Goal: Level of anxiety will decrease Outcome: Completed/Met   Problem: Elimination: Goal: Will not experience complications related to bowel motility Outcome: Completed/Met Goal: Will not experience complications related to urinary retention Outcome: Completed/Met   Problem: Pain Managment: Goal: General experience of comfort will improve Outcome: Completed/Met   Problem: Safety: Goal: Ability to remain free from injury will improve Outcome: Completed/Met   Problem: Skin Integrity: Goal: Risk for impaired skin integrity will decrease Outcome: Completed/Met

## 2021-02-15 NOTE — Plan of Care (Signed)
  Problem: Activity: Goal: Risk for activity intolerance will decrease Outcome: Progressing   Problem: Nutrition: Goal: Adequate nutrition will be maintained Outcome: Progressing   Problem: Safety: Goal: Ability to remain free from injury will improve Outcome: Progressing

## 2021-02-15 NOTE — Discharge Instructions (Addendum)
Follow with Primary MD [name], NP in 7 days, please follow pending final blood culture results next visit.  Get CBC, CMP, Ammonia, 2 view Chest X ray -  checked next visit within 1 week by Primary MD   Activity: As tolerated with Full fall precautions use walker/cane & assistance as needed  Disposition Home   Diet: Soft diet with feeding assistance and aspiration precautions.   Accuchecks 4 times/day, Once in AM empty stomach and then before each meal. Log in all results and show them to your Prim.MD in 3 days. If any glucose reading is under 80 or above 300 call your Prim MD immidiately. Follow Low glucose instructions for glucose under 80 as instructed.  Special Instructions: If you have smoked or chewed Tobacco  in the last 2 yrs please stop smoking, stop any regular Alcohol  and or any Recreational drug use.  On your next visit with your primary care physician please Get Medicines reviewed and adjusted.  Please request your Prim.MD to go over all Hospital Tests and Procedure/Radiological results at the follow up, please get all Hospital records sent to your Prim MD by signing hospital release before you go home.  If you experience worsening of your admission symptoms, develop shortness of breath, life threatening emergency, suicidal or homicidal thoughts you must seek medical attention immediately by calling 911 or calling your MD immediately  if symptoms less severe.  You Must read complete instructions/literature along with all the possible adverse reactions/side effects for all the Medicines you take and that have been prescribed to you. Take any new Medicines after you have completely understood and accpet all the possible adverse reactions/side effects.

## 2021-02-15 NOTE — Progress Notes (Signed)
PROGRESS NOTE                                                                                                                                                                                                             Patient Demographics:    [name], is a 65 y.o. female, DOB - March 08, 1956, FWY:[id number]  Admit date - 02/11/2021   Admitting Physician Eben Burow, MD  Outpatient Primary MD for the patient is [name], NP  LOS - 4  Chief Complaint  Patient presents with  . Code Stroke       Brief Narrative (HPI from H&P)   [name] is a 65 y.o. female with medical history significant for CKD, DMT2, hypothyroidism, Cirrhosis, TIA, Paroxysmal A-fib not on anticoagulation. She presented by EMS for evaluation of AMS. Reportedly her husband found her with her eyes open but unresponsive with a rightward gaze, no reported seizure-like activity initial CT head was unremarkable she was seen by neurology and thought to have severe metabolic/toxic encephalopathy and admitted for further work-up.   Subjective:   Patient in bed, appears comfortable, denies any headache, no fever, no chest pain or pressure, no shortness of breath , no abdominal pain. No new focal weakness.    Assessment  & Plan :     1. AMS - per when the episode happened suddenly, nothing like this is happened before, question if she had an episode of seizure, CT head, MRI brain and EEG nonacute, also treated with lactulose for elevated ammonia levels, mentation now close to baseline no focal deficits or headache, continue supportive care PT OT.  Continue bowel regimen to keep ammonia levels low, if stable likely discharge in the next 1 to 2 days.  Question if mental status change was due to toxic and metabolic encephalopathy caused by combination of UTI/CAP elevated ammonia levels.  2. UTI/CAP - on combination of Rocephin and doxycycline and monitor.  3. High Ammonia - no history of liver  issues, was constipated, was placed on lactulose along with bowel regimen, much improved. Continue Miralax.  4. HTN - placed on Norvasc and added Coreg.  5. AKI on CKD 3 A - improved with hydration.  6.  Low B12 levels.  Placed on supplementation.    7.  Staph hominis positive blood cultures 2 out of 2.  Afebrile, no leukocytosis, no hardware, procalcitonin undetectable, discussed with ID x2 Dr. Linus Salmons, this likely is contaminant however will repeat blood cultures on 02/15/2021  and monitor for another 24 hours.    8. DM2 - poor outpatient control due to hyperglycemia, based on Lantus and adjusted sliding scale, added Premeal NovoLog on 02/13/2021.  I have requested Insulin and diabetic education.  Lab Results  Component Value Date   HGBA1C 9.3 (H) 12/13/2020   CBG (last 3)  Recent Labs    02/14/21 1731 02/14/21 2128 02/15/21 0919  GLUCAP 104* 124* 206*      Condition - Fair  Family Communication  :  Verdis Frederickson [phone] on 02/12/21  Code Status :  Full  Consults  :  Neuro, ID Dr Linus Salmons - 02/13/21 -phone  Procedures  :    CT Head - Non acute  MRI Brain - Non Acute  EEG - Non Acute  PUD Prophylaxis : none  Disposition Plan  :    Status is: Inpatient  Remains inpatient appropriate because:IV treatments appropriate due to intensity of illness or inability to take PO   Dispo: The patient is from: Home              Anticipated d/c is to: Home              Patient currently is not medically stable to d/c.   Difficult to place patient No  DVT Prophylaxis  :   heparin injection 5,000 Units Start: 02/11/21 0615     Lab Results  Component Value Date   PLT 113 (L) 02/15/2021    Diet :  Diet Order            DIET DYS 3 Room service appropriate? Yes with Assist; Fluid consistency: Thin  Diet effective now                  Inpatient Medications Scheduled Meds: . amLODipine  10 mg Oral Daily  . carvedilol  3.125 mg Oral BID WC  . doxycycline  100 mg Oral  Q12H  . heparin  5,000 Units Subcutaneous Q8H  . insulin aspart  0-15 Units Subcutaneous TID WC  . insulin aspart  0-5 Units Subcutaneous QHS  . insulin aspart  3 Units Subcutaneous TID WC  . insulin glargine  25 Units Subcutaneous Daily  . polyethylene glycol  17 g Oral BID   Continuous Infusions: . cefTRIAXone (ROCEPHIN)  IV 1 g (02/15/21 0909)  . magnesium sulfate bolus IVPB     PRN Meds:.acetaminophen **OR** [DISCONTINUED] acetaminophen, albuterol, haloperidol lactate, traMADol  Antibiotics  :   Anti-infectives (From admission, onward)   Start     Dose/Rate Route Frequency Ordered Stop   02/15/21 0000  doxycycline (VIBRA-TABS) 100 MG tablet        100 mg Oral Every 12 hours 02/15/21 1012     02/13/21 2200  doxycycline (VIBRA-TABS) tablet 100 mg        100 mg Oral Every 12 hours 02/13/21 0922     02/12/21 0800  cefTRIAXone (ROCEPHIN) 1 g in sodium chloride 0.9 % 100 mL IVPB        1 g 200 mL/hr over 30 Minutes Intravenous Every 24 hours 02/11/21 0600     02/11/21 0800  doxycycline (VIBRAMYCIN) 100 mg in sodium chloride 0.9 % 250 mL IVPB  Status:  Discontinued        100 mg 125 mL/hr over 120 Minutes Intravenous Every 12 hours 02/11/21 0600 02/13/21 0922   02/11/21 0500  cefTRIAXone (ROCEPHIN) 2 g in sodium chloride 0.9 % 100 mL IVPB  2 g 200 mL/hr over 30 Minutes Intravenous  Once 02/11/21 0448 02/11/21 0545          Objective:   Vitals:   02/14/21 1732 02/14/21 2127 02/15/21 0519 02/15/21 0600  BP: 136/73 140/72 (!) 172/77 (!) 159/78  Pulse: 99 79 83 84  Resp: 15 18 18    Temp: 98 F (36.7 C) 98.2 F (36.8 C) 98 F (36.7 C) 98.6 F (37 C)  TempSrc: Oral Oral Oral Oral  SpO2: 92% 95% 97% 98%  Weight:      Height:        SpO2: 98 % O2 Flow Rate (L/min): 2 L/min  Wt Readings from Last 3 Encounters:  02/12/21 72.4 kg  01/25/21 72.1 kg  01/11/21 72.1 kg     Intake/Output Summary (Last 24 hours) at 02/15/2021 1018 Last data filed at 02/15/2021  0800 Gross per 24 hour  Intake 900 ml  Output 900 ml  Net 0 ml     Physical Exam  Awake Alert, No new F.N deficits, Normal affect [place].AT,PERRAL Supple Neck,No JVD, No cervical lymphadenopathy appriciated.  Symmetrical Chest wall movement, Good air movement bilaterally, CTAB RRR,No Gallops, Rubs or new Murmurs, No Parasternal Heave +ve B.Sounds, Abd Soft, No tenderness, No organomegaly appriciated, No rebound - guarding or rigidity. No Cyanosis, Clubbing or edema, No new Rash or bruise    Data Review:   Recent Labs  Lab 02/11/21 0150 02/11/21 0204 02/12/21 0227 02/12/21 0831 02/13/21 0632 02/14/21 0243 02/15/21 0423  WBC 7.8   < > 4.9 4.9 4.4 3.8* 4.0  HGB 10.9*   < > 9.1* 9.6* 9.6* 11.3* 10.4*  HCT 36.0   < > 30.1* 30.9* 30.3* 36.6 33.2*  PLT 151   < > 117* 127* 127* 131* 113*  MCV 92.8   < > 91.5 89.8 88.1 89.7 89.0  MCH 28.1   < > 27.7 27.9 27.9 27.7 27.9  MCHC 30.3   < > 30.2 31.1 31.7 30.9 31.3  RDW 15.9*   < > 15.8* 15.7* 15.6* 15.7* 15.9*  LYMPHSABS 0.8  --   --   --  0.6* 0.6* 0.7  MONOABS 0.4  --   --   --  0.2 0.2 0.2  EOSABS 0.1  --   --   --  0.2 0.2 0.2  BASOSABS 0.0  --   --   --  0.0 0.0 0.0   < > = values in this interval not displayed.    Recent Labs  Lab 02/11/21 0150 02/11/21 0204 02/11/21 1660 02/12/21 0227 02/12/21 0831 02/13/21 6301 02/13/21 0950 02/14/21 0243 02/15/21 0423 02/15/21 0847  NA 138   < >  --  140 137 136  --  137 137  --   K 4.6   < >  --  3.9 3.8 3.4*  --  4.1 3.9  --   CL 107   < >  --  110 107 108  --  110 107  --   CO2 20*  --   --  20* 20* 21*  --  20* 20*  --   GLUCOSE 253*   < >  --  194* 249* 344*  --  117* 118*  --   BUN 20   < >  --  22 19 12   --  16 16  --   CREATININE 2.58*   < >  --  1.93* 1.63* 1.17*  --  1.07* 1.07*  --   CALCIUM 8.5*  --   --  8.1* 8.2* 8.5*  --  9.0 8.6*  --   AST 44*  --   --   --   --  33  --  48* 26  --   ALT 24  --   --   --   --  35  --  28 27  --   ALKPHOS 123  --   --   --    --  114  --  127* 117  --   BILITOT 0.7  --   --   --   --  0.8  --  0.9 0.6  --   ALBUMIN 3.5  --   --   --   --  3.1*  --  3.3* 3.0*  --   MG  --   --   --   --  1.7 1.6*  --  1.9 1.6*  --   PROCALCITON  --   --   --   --   --   --  <0.10 <0.10  --  <0.10  INR 1.1  --   --   --   --   --   --   --   --   --   TSH  --   --  4.528*  --   --   --   --   --   --   --   AMMONIA  --   --  50*  --  63* 32  --  20 25  --   BNP  --   --   --   --  147.6* 137.7*  --  68.6 56.6  --    < > = values in this interval not displayed.    Recent Labs  Lab 02/11/21 0305 02/12/21 0831 02/13/21 9562 02/13/21 0950 02/14/21 0243 02/15/21 0423 02/15/21 0847  BNP  --  147.6* 137.7*  --  68.6 56.6  --   PROCALCITON  --   --   --  <0.10 <0.10  --  <0.10  SARSCOV2NAA NEGATIVE  --   --   --   --   --   --     ------------------------------------------------------------------------------------------------------------------ No results for input(s): CHOL, HDL, LDLCALC, TRIG, CHOLHDL, LDLDIRECT in the last 72 hours.  Lab Results  Component Value Date   HGBA1C 9.3 (H) 12/13/2020   ------------------------------------------------------------------------------------------------------------------ No results for input(s): TSH, T4TOTAL, T3FREE, THYROIDAB in the last 72 hours.  Invalid input(s): FREET3 ------------------------------------------------------------------------------------------------------------------ No results for input(s): VITAMINB12, FOLATE, FERRITIN, TIBC, IRON, RETICCTPCT in the last 72 hours.  Coagulation profile Recent Labs  Lab 02/11/21 0150  INR 1.1    No results for input(s): DDIMER in the last 72 hours.  Cardiac Enzymes No results for input(s): CKMB, TROPONINI, MYOGLOBIN in the last 168 hours.  Invalid input(s): CK ------------------------------------------------------------------------------------------------------------------    Component Value Date/Time   BNP 56.6  02/15/2021 0423    Micro Results Recent Results (from the past 240 hour(s))  SARS CORONAVIRUS 2 (TAT 6-24 HRS) Nasopharyngeal Nasopharyngeal Swab     Status: None   Collection Time: 02/11/21  3:05 AM   Specimen: Nasopharyngeal Swab  Result Value Ref Range Status   SARS Coronavirus 2 NEGATIVE NEGATIVE Final    Comment: (NOTE) SARS-CoV-2 target nucleic acids are NOT DETECTED.  The SARS-CoV-2 RNA is generally detectable in upper and lower respiratory specimens during the acute phase of infection. Negative results do not preclude SARS-CoV-2 infection, do not rule out co-infections with other pathogens,  and should not be used as the sole basis for treatment or other patient management decisions. Negative results must be combined with clinical observations, patient history, and epidemiological information. The expected result is Negative.  Fact Sheet for Patients: SugarRoll.be  Fact Sheet for Healthcare Providers: https://www.woods-mathews.com/  This test is not yet approved or cleared by the [place] FDA and  has been authorized for detection and/or diagnosis of SARS-CoV-2 by FDA under an Emergency Use Authorization (EUA). This EUA will remain  in effect (meaning this test can be used) for the duration of the COVID-19 declaration under Se ction 564(b)(1) of the Act, 21 U.S.C. section 360bbb-3(b)(1), unless the authorization is terminated or revoked sooner.  Performed at [facility], [place] [address], [place], [place] [address]   Culture, blood (Routine X 2) w Reflex to ID Panel     Status: Abnormal   Collection Time: 02/11/21  4:49 AM   Specimen: BLOOD  Result Value Ref Range Status   Specimen Description BLOOD BLOOD LEFT HAND  Final   Special Requests   Final    BOTTLES DRAWN AEROBIC AND ANAEROBIC Blood Culture adequate volume   Culture  Setup Time   Final    GRAM POSITIVE COCCI AEROBIC BOTTLE ONLY RESULTS PREVIOUSLY  CALLED AND VERIFIED Performed at [facility], [place] [address], [place], [place] [address]    Culture STAPHYLOCOCCUS HOMINIS (A)  Final   Report Status 02/14/2021 FINAL  Final   Organism ID, Bacteria STAPHYLOCOCCUS HOMINIS  Final      Susceptibility   Staphylococcus hominis - MIC*    CIPROFLOXACIN 1 SENSITIVE Sensitive     ERYTHROMYCIN <=0.25 SENSITIVE Sensitive     GENTAMICIN <=0.5 SENSITIVE Sensitive     OXACILLIN RESISTANT Resistant     TETRACYCLINE <=1 SENSITIVE Sensitive     VANCOMYCIN <=0.5 SENSITIVE Sensitive     TRIMETH/SULFA 20 SENSITIVE Sensitive     CLINDAMYCIN <=0.25 SENSITIVE Sensitive     RIFAMPIN <=0.5 SENSITIVE Sensitive     Inducible Clindamycin NEGATIVE Sensitive     * STAPHYLOCOCCUS HOMINIS  Culture, blood (Routine X 2) w Reflex to ID Panel     Status: Abnormal   Collection Time: 02/11/21  4:54 AM   Specimen: BLOOD  Result Value Ref Range Status   Specimen Description BLOOD BLOOD LEFT FOREARM  Final   Special Requests   Final    BOTTLES DRAWN AEROBIC AND ANAEROBIC Blood Culture adequate volume   Culture  Setup Time   Final    GRAM POSITIVE COCCI ANAEROBIC BOTTLE ONLY CRITICAL RESULT CALLED TO, READ BACK BY AND VERIFIED WITH: PHARMD JAMES LEDFORD 02/12/2021 AT 0044 A.HUGHES    Culture (A)  Final    STAPHYLOCOCCUS HOMINIS SUSCEPTIBILITIES PERFORMED ON PREVIOUS CULTURE WITHIN THE LAST 5 DAYS. Performed at [facility], [place] [address], [place],  [address]    Report Status 02/14/2021 FINAL  Final  Blood Culture ID Panel (Reflexed)     Status: Abnormal   Collection Time: 02/11/21  4:54 AM  Result Value Ref Range Status   Enterococcus faecalis NOT DETECTED NOT DETECTED Final   Enterococcus Faecium NOT DETECTED NOT DETECTED Final   Listeria monocytogenes NOT DETECTED NOT DETECTED Final   Staphylococcus species DETECTED (A) NOT DETECTED Final    Comment: CRITICAL RESULT CALLED TO, READ BACK BY AND VERIFIED WITH: PHARMD JAMES LEDFORD 02/12/2021  AT 0044 A.HUGHES    Staphylococcus aureus (BCID) NOT DETECTED NOT DETECTED Final   Staphylococcus epidermidis  NOT DETECTED NOT DETECTED Final   Staphylococcus lugdunensis NOT DETECTED NOT DETECTED Final   Streptococcus species NOT DETECTED NOT DETECTED Final   Streptococcus agalactiae NOT DETECTED NOT DETECTED Final   Streptococcus pneumoniae NOT DETECTED NOT DETECTED Final   Streptococcus pyogenes NOT DETECTED NOT DETECTED Final   A.calcoaceticus-baumannii NOT DETECTED NOT DETECTED Final   Bacteroides fragilis NOT DETECTED NOT DETECTED Final   Enterobacterales NOT DETECTED NOT DETECTED Final   Enterobacter cloacae complex NOT DETECTED NOT DETECTED Final   Escherichia coli NOT DETECTED NOT DETECTED Final   Klebsiella aerogenes NOT DETECTED NOT DETECTED Final   Klebsiella oxytoca NOT DETECTED NOT DETECTED Final   Klebsiella pneumoniae NOT DETECTED NOT DETECTED Final   Proteus species NOT DETECTED NOT DETECTED Final   Salmonella species NOT DETECTED NOT DETECTED Final   Serratia marcescens NOT DETECTED NOT DETECTED Final   Haemophilus influenzae NOT DETECTED NOT DETECTED Final   Neisseria meningitidis NOT DETECTED NOT DETECTED Final   Pseudomonas aeruginosa NOT DETECTED NOT DETECTED Final   Stenotrophomonas maltophilia NOT DETECTED NOT DETECTED Final   Candida albicans NOT DETECTED NOT DETECTED Final   Candida auris NOT DETECTED NOT DETECTED Final   Candida glabrata NOT DETECTED NOT DETECTED Final   Candida krusei NOT DETECTED NOT DETECTED Final   Candida parapsilosis NOT DETECTED NOT DETECTED Final   Candida tropicalis NOT DETECTED NOT DETECTED Final   Cryptococcus neoformans/gattii NOT DETECTED NOT DETECTED Final    Comment: Performed at [facility], [address] [place], [place] [address]    Radiology Reports DG Chest 2 View  Result Date: 01/25/2021 CLINICAL DATA:  Chest pain EXAM: CHEST - 2 VIEW COMPARISON:  None. FINDINGS: The heart size and mediastinal contours are  within normal limits. Both lungs are clear. The visualized skeletal structures are unremarkable. IMPRESSION: No active cardiopulmonary disease. Electronically Signed   By: Ulyses Jarred M.D.   On: 01/25/2021 02:06   MR BRAIN WO CONTRAST  Result Date: 02/12/2021 CLINICAL DATA:  Altered mental status. Unresponsive with gaze deviation. EXAM: MRI HEAD WITHOUT CONTRAST TECHNIQUE: Multiplanar, multiecho pulse sequences of the brain and surrounding structures were obtained without intravenous contrast. COMPARISON:  Head CT yesterday. FINDINGS: Brain: Diffusion imaging does not show any acute or subacute infarction. Chronic small-vessel ischemic changes are present within the pons. Few old small vessel cerebellar infarctions on the right. Cerebral hemispheres show mild chronic small-vessel ischemic changes of the white matter. No large vessel territory infarction. No mass lesion, hemorrhage, hydrocephalus or extra-axial collection. Vascular: Major vessels at the base of the brain show flow. Skull and upper cervical spine: Negative Sinuses/Orbits: Paranasal sinuses show some opacification in the right ethmoid region. Orbits are negative. Other: Bilateral mastoid effusions, left more extensive than right IMPRESSION: No acute or subacute infarction. Chronic small-vessel ischemic changes of the pons and cerebral hemispheric white matter. Right ethmoid sinus opacification. Mastoid effusions, left more extensive than right. Electronically Signed   By: Nelson Chimes M.D.   On: 02/12/2021 17:41   DG Chest Port 1 View  Result Date: 02/12/2021 CLINICAL DATA:  Shortness of breath EXAM: PORTABLE CHEST 1 VIEW COMPARISON:  Portable exam 1442 hours compared to 02/11/2021 FINDINGS: Enlargement of cardiac silhouette. Mediastinal contours and pulmonary vascularity normal. Peribronchial thickening with minimal LEFT basilar atelectasis. Remaining lungs clear. No acute infiltrate, pleural effusion or pneumothorax. Surgical clips RIGHT  upper quadrant question cholecystectomy. No acute osseous findings. IMPRESSION: Bronchitic changes with minimal LEFT basilar atelectasis. Enlargement of cardiac silhouette. Electronically Signed  By: Lavonia Dana M.D.   On: 02/12/2021 14:59   DG Chest Port 1 View  Result Date: 02/11/2021 CLINICAL DATA:  Hypoxia. EXAM: PORTABLE CHEST 1 VIEW COMPARISON:  Chest x-ray 01/25/2021, chest x-ray 12/12/2020, CT chest 07/24/2019 FINDINGS: The heart size and mediastinal contours are unchanged. Aortic calcification. Patchy left lower lobe opacities. Similar appearing coarsened interstitial markings with no overt pulmonary edema. Trace left pleural effusion not excluded. No pneumothorax. No acute osseous abnormality. IMPRESSION: Patchy left lower lobe opacities and possible trace left pleural effusion. Finding may represent atelectasis versus infection/inflammation. Consider PA and lateral view of the chest for further evaluation. Electronically Signed   By: Iven Finn M.D.   On: 02/11/2021 02:55   EEG adult  Result Date: 02/12/2021 Lora Havens, MD     02/12/2021  1:52 PM Patient Name: CHIZUKO TRINE MRN: [id number] Epilepsy Attending: Lora Havens Referring Physician/Provider: Dr Harrold Donath Date: 02/12/2021 Duration: 23.50 mins Patient history: 65 year old female presenting to the ED after being found in an awake unresponsive state at home. EEG to evaluate for seizure. Level of alertness: Awake AEDs during EEG study: None Technical aspects: This EEG study was done with scalp electrodes positioned according to the 10-20 International system of electrode placement. Electrical activity was acquired at a sampling rate of 500Hz  and reviewed with a high frequency filter of 70Hz  and a low frequency filter of 1Hz . EEG data were recorded continuously and digitally stored. Description: The posterior dominant rhythm consists of 7 Hz activity of moderate voltage (25-35 uV) seen predominantly in posterior head  regions, symmetric and reactive to eye opening and eye closing. EEG showed continuous generalized 5 to 6 Hz theta as well as intermittent 2-3hz  delta slowing. Generalized periodic discharges with triphasic morphology at  1 Hz were also noted. Hyperventilation and photic stimulation were not performed.   ABNORMALITY - Periodic discharges with triphasic morphology, generalized ( GPDs) - Continuous slow, generalized IMPRESSION: This study is suggestive of moderate diffuse encephalopathy, nonspecific etiology but could be secondary to toxic-metabolic causes. No seizures or definite epileptiform discharges were seen throughout the recording. Lora Havens   CT HEAD CODE STROKE WO CONTRAST  Result Date: 02/11/2021 CLINICAL DATA:  Code stroke.  Acute neurologic deficit EXAM: CT HEAD WITHOUT CONTRAST TECHNIQUE: Contiguous axial images were obtained from the base of the skull through the vertex without intravenous contrast. COMPARISON:  12/12/2020 FINDINGS: Brain: There is no mass, hemorrhage or extra-axial collection. The size and configuration of the ventricles and extra-axial CSF spaces are normal. There is hypoattenuation of the periventricular white matter, most commonly indicating chronic ischemic microangiopathy. Vascular: No abnormal hyperdensity of the major intracranial arteries or dural venous sinuses. No intracranial atherosclerosis. Skull: The visualized skull base, calvarium and extracranial soft tissues are normal. Sinuses/Orbits: No fluid levels or advanced mucosal thickening of the visualized paranasal sinuses. No mastoid or middle ear effusion. The orbits are normal. ASPECTS [facility] Stroke Program Early CT Score) - Ganglionic level infarction (caudate, lentiform nuclei, internal capsule, insula, M1-M3 cortex): 7 - Supraganglionic infarction (M4-M6 cortex): 3 Total score (0-10 with 10 being normal): 10 IMPRESSION: 1. Chronic ischemic microangiopathy without acute intracranial abnormality. 2. ASPECTS  is 10. 3. These results were communicated to Dr. Kerney Elbe at 2:22 am on 02/11/2021 by text page via the [facility] messaging system. Electronically Signed   By: Ulyses Jarred M.D.   On: 02/11/2021 02:23    Time Spent in minutes  [place] M.D  on 02/15/2021 at 10:18 AM  To page go to [email or web address]

## 2021-02-15 NOTE — Care Management Important Message (Signed)
Important Message  Patient Details  Name: [name] MRN: [id number] Date of Birth: [birth year]   Medicare Important Message Given:  Yes     Katye Valek P [place] 02/15/2021, 11:56 AM

## 2021-02-15 NOTE — Discharge Summary (Signed)
[name] WHQ:[id number] DOB: December 27, 1955 DOA: 02/11/2021  PCP: [name], NP  Admit date: 02/11/2021  Discharge date: 02/16/2021  Admitted From: Home  Disposition:  Home   Recommendations for Outpatient Follow-up:   Follow up with PCP in 1-2 weeks  PCP Please obtain BMP/CBC, 2 view CXR in 1week,  (see Discharge instructions)   PCP Please follow up on the following pending results: Follow-up pending final blood culture results next visit, check CBC, CMP, TSH, 2 view chest x-ray next visit.   Home Health: PT, RN   Equipment/Devices: Walker  Consultations: None IDF Dr Linus Salmons - phone x 2 Discharge Condition: Stable    CODE STATUS: Full    Diet Recommendation: Heart Healthy Low Carb  Chief Complaint  Patient presents with   Code Stroke     Brief history of present illness from the day of admission and additional interim summary    [name] is a 65 y.o. female with medical history significant for CKD, DMT2, hypothyroidism, Cirrhosis, TIA, Paroxysmal A-fib not on anticoagulation. She presented by EMS for evaluation of AMS. Reportedly her husband found her with her eyes open but unresponsive with a rightward gaze, no reported seizure-like activity initial CT head was unremarkable she was seen by neurology and thought to have severe metabolic/toxic encephalopathy and admitted for further work-up.                                                                 Hospital Course   1. AMS - per when the episode happened suddenly, nothing like this is happened before, question if she had an episode of seizure, CT head, MRI brain and EEG nonacute, also treated with lactulose for elevated ammonia levels, mentation now close to baseline no focal deficits or headache, continue supportive care PT OT.  Continue  bowel regimen to keep ammonia levels low, now symptom-free will be discharged home later today.  Question if mental status change was due to toxic and metabolic encephalopathy caused by combination of UTI/CAP elevated ammonia levels.   2. UTI/CAP - on combination of Rocephin and doxycycline and monitor.   3. High Ammonia - no history of liver issues, was constipated, was placed on lactulose along with bowel regimen, much improved. Continue Miralax and lactulose at home with PCP follow-up and monitoring.   4. HTN - placed on Norvasc and added Coreg.   5. AKI on CKD 3 A - improved with hydration.   6. Borderline Low B12 levels.  Placed on supplementation.     7.  Staph hominis positive blood cultures 2 out of 2.  Afebrile, no leukocytosis, no hardware, procalcitonin undetectable, discussed with ID x2 Dr. Linus Salmons, this likely is contaminant however have repeated blood cultures on 02/15/2021 and are so far negative, request PCP to  monitor next visit, will be going home on 3 more days of oral doxycycline.     8. DM2 - poor outpatient control due to hyperglycemia, for now home regimen continued and counseled on compliance and dietary control.  She received insulin and diabetic education.  PCP to continue monitoring glycemic control.  Lab Results  Component Value Date   HGBA1C 9.3 (H) 12/13/2020     Discharge diagnosis     Principal Problem:   Encephalopathy Active Problems:   Uncontrolled type 2 diabetes mellitus ([place])   Hypotension   Community acquired pneumonia of left lower lobe of lung   Acute renal failure superimposed on stage 3a chronic kidney disease [facility])    Discharge instructions    Discharge Instructions     Discharge instructions   Complete by: As directed    Follow with Primary MD [name], NP in 7 days   Get CBC, CMP, Ammonia, 2 view Chest X ray -  checked next visit within 1 week by Primary MD   Activity: As tolerated with Full fall precautions use  walker/cane & assistance as needed  Disposition Home   Diet: Soft diet with feeding assistance and aspiration precautions.   Accuchecks 4 times/day, Once in AM empty stomach and then before each meal. Log in all results and show them to your Prim.MD in 3 days. If any glucose reading is under 80 or above 300 call your Prim MD immidiately. Follow Low glucose instructions for glucose under 80 as instructed.  Special Instructions: If you have smoked or chewed Tobacco  in the last 2 yrs please stop smoking, stop any regular Alcohol  and or any Recreational drug use.  On your next visit with your primary care physician please Get Medicines reviewed and adjusted.  Please request your Prim.MD to go over all Hospital Tests and Procedure/Radiological results at the follow up, please get all Hospital records sent to your Prim MD by signing hospital release before you go home.  If you experience worsening of your admission symptoms, develop shortness of breath, life threatening emergency, suicidal or homicidal thoughts you must seek medical attention immediately by calling 911 or calling your MD immediately  if symptoms less severe.  You Must read complete instructions/literature along with all the possible adverse reactions/side effects for all the Medicines you take and that have been prescribed to you. Take any new Medicines after you have completely understood and accpet all the possible adverse reactions/side effects.   Increase activity slowly   Complete by: As directed        Discharge Medications   Allergies as of 02/16/2021       Reactions   Latex Other (See Comments)   Pt had allergic reaction to tegaderm dressing on IV.  Caused blisters on the skin.   Aspirin Rash   Codeine Sulfate Rash   Erythromycin Rash, Other (See Comments)   Fever, also   Prednisone Swelling   Rosiglitazone Maleate Swelling   Tetanus-diphtheria Toxoids Td Rash, Other (See Comments)   Fever, also         Medication List     STOP taking these medications    lidocaine 5 % Commonly known as: Lidoderm       TAKE these medications    amLODipine 10 MG tablet Commonly known as: NORVASC Take 1 tablet (10 mg total) by mouth daily.   carvedilol 3.125 MG tablet Commonly known as: COREG Take 1 tablet (3.125 mg total) by mouth 2 (two) times  daily with a meal.   docusate sodium 100 MG capsule Commonly known as: Colace Take 1 capsule (100 mg total) by mouth daily.   doxycycline 100 MG tablet Commonly known as: VIBRA-TABS Take 1 tablet (100 mg total) by mouth every 12 (twelve) hours.   insulin lispro 100 UNIT/ML KwikPen Commonly known as: HumaLOG KwikPen Before each meal 3 times a day, 140-199 - 2 units, 200-250 - 4 units, 251-299 - 6 units,  300-349 - 8 units,  350 or above 10 units. Insulin PEN if approved, provide syringes and needles if needed.   Insulin Syringe-Needle U-100 25G X 1" 1 ML Misc For 4 times a day insulin SQ, 1 month supply. Diagnosis E11.65   lactulose 10 GM/15ML solution Commonly known as: CHRONULAC Take 30 mLs (20 g total) by mouth daily. Skip if you have diarrhea.   Levemir FlexTouch 100 UNIT/ML FlexPen Generic drug: insulin detemir Inject 30 Units into the skin 2 (two) times daily.   Melatonin 10 MG Caps Take 1 tablet by mouth at bedtime as needed (sleep).   polyethylene glycol 17 g packet Commonly known as: MIRALAX / GLYCOLAX Take 17 g by mouth 2 (two) times daily.   sertraline 50 MG tablet Commonly known as: ZOLOFT Take 1 tablet (50 mg total) by mouth daily. For anxiety and depression.   vitamin B-12 1000 MCG tablet Commonly known as: CYANOCOBALAMIN Take 1 tablet (1,000 mcg total) by mouth daily.         Follow-up Information     [name], NP. Schedule an appointment as soon as possible for a visit in 1 week(s).   Specialty: Internal Medicine Contact information: [address] [place] [address] [phone]          Minna Merritts, MD. Schedule an appointment as soon as possible for a visit in 1 week(s).   Specialty: Cardiology Contact information: [place] [address] [phone]                 Major procedures and Radiology Reports - PLEASE review detailed and final reports thoroughly  -      DG Chest 2 View  Result Date: 01/25/2021 CLINICAL DATA:  Chest pain EXAM: CHEST - 2 VIEW COMPARISON:  None. FINDINGS: The heart size and mediastinal contours are within normal limits. Both lungs are clear. The visualized skeletal structures are unremarkable. IMPRESSION: No active cardiopulmonary disease. Electronically Signed   By: Ulyses Jarred M.D.   On: 01/25/2021 02:06   MR BRAIN WO CONTRAST  Result Date: 02/12/2021 CLINICAL DATA:  Altered mental status. Unresponsive with gaze deviation. EXAM: MRI HEAD WITHOUT CONTRAST TECHNIQUE: Multiplanar, multiecho pulse sequences of the brain and surrounding structures were obtained without intravenous contrast. COMPARISON:  Head CT yesterday. FINDINGS: Brain: Diffusion imaging does not show any acute or subacute infarction. Chronic small-vessel ischemic changes are present within the pons. Few old small vessel cerebellar infarctions on the right. Cerebral hemispheres show mild chronic small-vessel ischemic changes of the white matter. No large vessel territory infarction. No mass lesion, hemorrhage, hydrocephalus or extra-axial collection. Vascular: Major vessels at the base of the brain show flow. Skull and upper cervical spine: Negative Sinuses/Orbits: Paranasal sinuses show some opacification in the right ethmoid region. Orbits are negative. Other: Bilateral mastoid effusions, left more extensive than right IMPRESSION: No acute or subacute infarction. Chronic small-vessel ischemic changes of the pons and cerebral hemispheric white matter. Right ethmoid sinus opacification. Mastoid effusions, left more  extensive than right.  Electronically Signed   By: Nelson Chimes M.D.   On: 02/12/2021 17:41   DG Chest Port 1 View  Result Date: 02/12/2021 CLINICAL DATA:  Shortness of breath EXAM: PORTABLE CHEST 1 VIEW COMPARISON:  Portable exam 1442 hours compared to 02/11/2021 FINDINGS: Enlargement of cardiac silhouette. Mediastinal contours and pulmonary vascularity normal. Peribronchial thickening with minimal LEFT basilar atelectasis. Remaining lungs clear. No acute infiltrate, pleural effusion or pneumothorax. Surgical clips RIGHT upper quadrant question cholecystectomy. No acute osseous findings. IMPRESSION: Bronchitic changes with minimal LEFT basilar atelectasis. Enlargement of cardiac silhouette. Electronically Signed   By: Lavonia Dana M.D.   On: 02/12/2021 14:59   DG Chest Port 1 View  Result Date: 02/11/2021 CLINICAL DATA:  Hypoxia. EXAM: PORTABLE CHEST 1 VIEW COMPARISON:  Chest x-ray 01/25/2021, chest x-ray 12/12/2020, CT chest 07/24/2019 FINDINGS: The heart size and mediastinal contours are unchanged. Aortic calcification. Patchy left lower lobe opacities. Similar appearing coarsened interstitial markings with no overt pulmonary edema. Trace left pleural effusion not excluded. No pneumothorax. No acute osseous abnormality. IMPRESSION: Patchy left lower lobe opacities and possible trace left pleural effusion. Finding may represent atelectasis versus infection/inflammation. Consider PA and lateral view of the chest for further evaluation. Electronically Signed   By: Iven Finn M.D.   On: 02/11/2021 02:55   EEG adult  Result Date: 02/12/2021 Lora Havens, MD     02/12/2021  1:52 PM Patient Name: [name] MRN: [id number] Epilepsy Attending: Lora Havens Referring Physician/Provider: Dr Harrold Donath Date: 02/12/2021 Duration: 23.50 mins Patient history: 65 year old female presenting to the ED after being found in an awake unresponsive state at home. EEG to evaluate for seizure. Level of alertness: Awake AEDs during  EEG study: None Technical aspects: This EEG study was done with scalp electrodes positioned according to the 10-20 International system of electrode placement. Electrical activity was acquired at a sampling rate of 500Hz  and reviewed with a high frequency filter of 70Hz  and a low frequency filter of 1Hz . EEG data were recorded continuously and digitally stored. Description: The posterior dominant rhythm consists of 7 Hz activity of moderate voltage (25-35 uV) seen predominantly in posterior head regions, symmetric and reactive to eye opening and eye closing. EEG showed continuous generalized 5 to 6 Hz theta as well as intermittent 2-3hz  delta slowing. Generalized periodic discharges with triphasic morphology at  1 Hz were also noted. Hyperventilation and photic stimulation were not performed.   ABNORMALITY - Periodic discharges with triphasic morphology, generalized ( GPDs) - Continuous slow, generalized IMPRESSION: This study is suggestive of moderate diffuse encephalopathy, nonspecific etiology but could be secondary to toxic-metabolic causes. No seizures or definite epileptiform discharges were seen throughout the recording. Lora Havens   CT HEAD CODE STROKE WO CONTRAST  Result Date: 02/11/2021 CLINICAL DATA:  Code stroke.  Acute neurologic deficit EXAM: CT HEAD WITHOUT CONTRAST TECHNIQUE: Contiguous axial images were obtained from the base of the skull through the vertex without intravenous contrast. COMPARISON:  12/12/2020 FINDINGS: Brain: There is no mass, hemorrhage or extra-axial collection. The size and configuration of the ventricles and extra-axial CSF spaces are normal. There is hypoattenuation of the periventricular white matter, most commonly indicating chronic ischemic microangiopathy. Vascular: No abnormal hyperdensity of the major intracranial arteries or dural venous sinuses. No intracranial atherosclerosis. Skull: The visualized skull base, calvarium and extracranial soft tissues are  normal. Sinuses/Orbits: No fluid levels or advanced mucosal thickening of the visualized paranasal sinuses. No mastoid or  middle ear effusion. The orbits are normal. ASPECTS [facility] Stroke Program Early CT Score) - Ganglionic level infarction (caudate, lentiform nuclei, internal capsule, insula, M1-M3 cortex): 7 - Supraganglionic infarction (M4-M6 cortex): 3 Total score (0-10 with 10 being normal): 10 IMPRESSION: 1. Chronic ischemic microangiopathy without acute intracranial abnormality. 2. ASPECTS is 10. 3. These results were communicated to Dr. Kerney Elbe at 2:22 am on 02/11/2021 by text page via the [facility] messaging system. Electronically Signed   By: Ulyses Jarred M.D.   On: 02/11/2021 02:23     Micro Results     Recent Results (from the past 240 hour(s))  SARS CORONAVIRUS 2 (TAT 6-24 HRS) Nasopharyngeal Nasopharyngeal Swab     Status: None   Collection Time: 02/11/21  3:05 AM   Specimen: Nasopharyngeal Swab  Result Value Ref Range Status   SARS Coronavirus 2 NEGATIVE NEGATIVE Final    Comment: (NOTE) SARS-CoV-2 target nucleic acids are NOT DETECTED.  The SARS-CoV-2 RNA is generally detectable in upper and lower respiratory specimens during the acute phase of infection. Negative results do not preclude SARS-CoV-2 infection, do not rule out co-infections with other pathogens, and should not be used as the sole basis for treatment or other patient management decisions. Negative results must be combined with clinical observations, patient history, and epidemiological information. The expected result is Negative.  Fact Sheet for Patients: SugarRoll.be  Fact Sheet for Healthcare Providers: https://www.woods-mathews.com/  This test is not yet approved or cleared by the [place] FDA and  has been authorized for detection and/or diagnosis of SARS-CoV-2 by FDA under an Emergency Use Authorization (EUA). This EUA will remain  in effect (meaning  this test can be used) for the duration of the COVID-19 declaration under Se ction 564(b)(1) of the Act, 21 U.S.C. section 360bbb-3(b)(1), unless the authorization is terminated or revoked sooner.  Performed at [facility], [place] [address] [place], [place] [address]   Culture, blood (Routine X 2) w Reflex to ID Panel     Status: Abnormal   Collection Time: 02/11/21  4:49 AM   Specimen: BLOOD  Result Value Ref Range Status   Specimen Description BLOOD BLOOD LEFT HAND  Final   Special Requests   Final    BOTTLES DRAWN AEROBIC AND ANAEROBIC Blood Culture adequate volume   Culture  Setup Time   Final    GRAM POSITIVE COCCI AEROBIC BOTTLE ONLY RESULTS PREVIOUSLY CALLED AND VERIFIED Performed at [facility], [place] [address], [place], [place] [address]    Culture STAPHYLOCOCCUS HOMINIS (A)  Final   Report Status 02/14/2021 FINAL  Final   Organism ID, Bacteria STAPHYLOCOCCUS HOMINIS  Final      Susceptibility   Staphylococcus hominis - MIC*    CIPROFLOXACIN 1 SENSITIVE Sensitive     ERYTHROMYCIN <=0.25 SENSITIVE Sensitive     GENTAMICIN <=0.5 SENSITIVE Sensitive     OXACILLIN RESISTANT Resistant     TETRACYCLINE <=1 SENSITIVE Sensitive     VANCOMYCIN <=0.5 SENSITIVE Sensitive     TRIMETH/SULFA 20 SENSITIVE Sensitive     CLINDAMYCIN <=0.25 SENSITIVE Sensitive     RIFAMPIN <=0.5 SENSITIVE Sensitive     Inducible Clindamycin NEGATIVE Sensitive     * STAPHYLOCOCCUS HOMINIS  Culture, blood (Routine X 2) w Reflex to ID Panel     Status: Abnormal   Collection Time: 02/11/21  4:54 AM   Specimen: BLOOD  Result Value Ref Range Status   Specimen Description BLOOD BLOOD LEFT FOREARM  Final  Special Requests   Final    BOTTLES DRAWN AEROBIC AND ANAEROBIC Blood Culture adequate volume   Culture  Setup Time   Final    GRAM POSITIVE COCCI ANAEROBIC BOTTLE ONLY CRITICAL RESULT CALLED TO, READ BACK BY AND VERIFIED WITH: PHARMD JAMES LEDFORD 02/12/2021 AT 0044 A.HUGHES    Culture  (A)  Final    STAPHYLOCOCCUS HOMINIS SUSCEPTIBILITIES PERFORMED ON PREVIOUS CULTURE WITHIN THE LAST 5 DAYS. Performed at [facility], [place] [address] [place], [place] [address]    Report Status 02/14/2021 FINAL  Final  Blood Culture ID Panel (Reflexed)     Status: Abnormal   Collection Time: 02/11/21  4:54 AM  Result Value Ref Range Status   Enterococcus faecalis NOT DETECTED NOT DETECTED Final   Enterococcus Faecium NOT DETECTED NOT DETECTED Final   Listeria monocytogenes NOT DETECTED NOT DETECTED Final   Staphylococcus species DETECTED (A) NOT DETECTED Final    Comment: CRITICAL RESULT CALLED TO, READ BACK BY AND VERIFIED WITH: PHARMD JAMES LEDFORD 02/12/2021 AT 0044 A.HUGHES    Staphylococcus aureus (BCID) NOT DETECTED NOT DETECTED Final   Staphylococcus epidermidis NOT DETECTED NOT DETECTED Final   Staphylococcus lugdunensis NOT DETECTED NOT DETECTED Final   Streptococcus species NOT DETECTED NOT DETECTED Final   Streptococcus agalactiae NOT DETECTED NOT DETECTED Final   Streptococcus pneumoniae NOT DETECTED NOT DETECTED Final   Streptococcus pyogenes NOT DETECTED NOT DETECTED Final   A.calcoaceticus-baumannii NOT DETECTED NOT DETECTED Final   Bacteroides fragilis NOT DETECTED NOT DETECTED Final   Enterobacterales NOT DETECTED NOT DETECTED Final   Enterobacter cloacae complex NOT DETECTED NOT DETECTED Final   Escherichia coli NOT DETECTED NOT DETECTED Final   Klebsiella aerogenes NOT DETECTED NOT DETECTED Final   Klebsiella oxytoca NOT DETECTED NOT DETECTED Final   Klebsiella pneumoniae NOT DETECTED NOT DETECTED Final   Proteus species NOT DETECTED NOT DETECTED Final   Salmonella species NOT DETECTED NOT DETECTED Final   Serratia marcescens NOT DETECTED NOT DETECTED Final   Haemophilus influenzae NOT DETECTED NOT DETECTED Final   Neisseria meningitidis NOT DETECTED NOT DETECTED Final   Pseudomonas aeruginosa NOT DETECTED NOT DETECTED Final   Stenotrophomonas  maltophilia NOT DETECTED NOT DETECTED Final   Candida albicans NOT DETECTED NOT DETECTED Final   Candida auris NOT DETECTED NOT DETECTED Final   Candida glabrata NOT DETECTED NOT DETECTED Final   Candida krusei NOT DETECTED NOT DETECTED Final   Candida parapsilosis NOT DETECTED NOT DETECTED Final   Candida tropicalis NOT DETECTED NOT DETECTED Final   Cryptococcus neoformans/gattii NOT DETECTED NOT DETECTED Final    Comment: Performed at [facility], [address], [place], [place] [address]  Culture, blood (routine x 2)     Status: None (Preliminary result)   Collection Time: 02/15/21 11:58 AM   Specimen: BLOOD  Result Value Ref Range Status   Specimen Description BLOOD THUMB  Final   Special Requests   Final    BOTTLES DRAWN AEROBIC AND ANAEROBIC Blood Culture adequate volume   Culture   Final    NO GROWTH < 24 HOURS Performed at [facility], [place] [address] [place], [place] [address]    Report Status PENDING  Incomplete  Culture, blood (routine x 2)     Status: None (Preliminary result)   Collection Time: 02/15/21 12:11 PM   Specimen: BLOOD RIGHT HAND  Result Value Ref Range Status   Specimen Description BLOOD RIGHT HAND  Final   Special Requests  Final    BOTTLES DRAWN AEROBIC ONLY Blood Culture results may not be optimal due to an inadequate volume of blood received in culture bottles   Culture   Final    NO GROWTH < 24 HOURS Performed at [place] [address], [place], [place] [address]    Report Status PENDING  Incomplete    Today   Subjective    [name] today has no headache,no chest abdominal pain,no new weakness tingling or numbness, feels much better wants to go home today.     Objective   Blood pressure (!) 155/72, pulse 86, temperature 97.8 F (36.6 C), temperature source Oral, resp. rate 18, height 5' 3"  (1.6 m), weight 72.4 kg, SpO2 99 %.   Intake/Output Summary (Last 24 hours) at 02/16/2021 0914 Last data filed at  02/16/2021 0826 Gross per 24 hour  Intake 1210 ml  Output 950 ml  Net 260 ml    Exam  Awake Alert, No new F.N deficits, Normal affect [place].AT,PERRAL Supple Neck,No JVD, No cervical lymphadenopathy appriciated.  Symmetrical Chest wall movement, Good air movement bilaterally, CTAB RRR,No Gallops,Rubs or new Murmurs, No Parasternal Heave +ve B.Sounds, Abd Soft, Non tender, No organomegaly appriciated, No rebound -guarding or rigidity. No Cyanosis, Clubbing or edema, No new Rash or bruise   Data Review   CBC w Diff:  Lab Results  Component Value Date   WBC 4.0 02/16/2021   HGB 10.5 (L) 02/16/2021   HGB 12.9 10/13/2014   HCT 33.7 (L) 02/16/2021   HCT 38.4 10/13/2014   PLT 145 (L) 02/16/2021   PLT 113 (L) 10/13/2014   LYMPHOPCT 18 02/16/2021   LYMPHOPCT 24.8 10/13/2014   BANDSPCT 0 10/26/2007   MONOPCT 7 02/16/2021   MONOPCT 9.6 10/13/2014   EOSPCT 5 02/16/2021   EOSPCT 2.0 10/13/2014   BASOPCT 1 02/16/2021   BASOPCT 0.4 10/13/2014    CMP:  Lab Results  Component Value Date   NA 136 02/16/2021   NA 138 10/14/2014   K 3.5 02/16/2021   K 4.1 10/14/2014   CL 108 02/16/2021   CL 107 10/14/2014   CO2 20 (L) 02/16/2021   CO2 26 10/14/2014   BUN 20 02/16/2021   BUN 3 (L) 10/14/2014   CREATININE 1.20 (H) 02/16/2021   CREATININE 0.68 10/14/2014   PROT 6.1 (L) 02/16/2021   PROT 5.7 (L) 10/12/2014   ALBUMIN 3.0 (L) 02/16/2021   ALBUMIN 2.7 (L) 10/12/2014   BILITOT 0.8 02/16/2021   BILITOT 0.8 10/12/2014   ALKPHOS 109 02/16/2021   ALKPHOS 78 10/12/2014   AST 24 02/16/2021   AST 42 (H) 10/12/2014   ALT 26 02/16/2021   ALT 95 (H) 10/12/2014  . Lab Results  Component Value Date   HGBA1C 9.3 (H) 12/13/2020   CBG (last 3)  Recent Labs    02/15/21 1627 02/15/21 2145 02/16/21 0719  GLUCAP 149* 99 105*     Total Time in preparing paper work, data evaluation and todays exam - 18 minutes  Lala Lund M.D on 02/16/2021 at 9:14 AM  [facility]

## 2021-02-16 ENCOUNTER — Telehealth: Payer: Self-pay

## 2021-02-16 LAB — CBC WITH DIFFERENTIAL/PLATELET
Abs Immature Granulocytes: 0.01 10*3/uL (ref 0.00–0.07)
Basophils Absolute: 0 10*3/uL (ref 0.0–0.1)
Basophils Relative: 1 %
Eosinophils Absolute: 0.2 10*3/uL (ref 0.0–0.5)
Eosinophils Relative: 5 %
HCT: 33.7 % — ABNORMAL LOW (ref 36.0–46.0)
Hemoglobin: 10.5 g/dL — ABNORMAL LOW (ref 12.0–15.0)
Immature Granulocytes: 0 %
Lymphocytes Relative: 18 %
Lymphs Abs: 0.7 10*3/uL (ref 0.7–4.0)
MCH: 27.6 pg (ref 26.0–34.0)
MCHC: 31.2 g/dL (ref 30.0–36.0)
MCV: 88.7 fL (ref 80.0–100.0)
Monocytes Absolute: 0.3 10*3/uL (ref 0.1–1.0)
Monocytes Relative: 7 %
Neutro Abs: 2.8 10*3/uL (ref 1.7–7.7)
Neutrophils Relative %: 69 %
Platelets: 145 10*3/uL — ABNORMAL LOW (ref 150–400)
RBC: 3.8 MIL/uL — ABNORMAL LOW (ref 3.87–5.11)
RDW: 16 % — ABNORMAL HIGH (ref 11.5–15.5)
WBC: 4 10*3/uL (ref 4.0–10.5)
nRBC: 0 % (ref 0.0–0.2)

## 2021-02-16 LAB — GLUCOSE, CAPILLARY
Glucose-Capillary: 105 mg/dL — ABNORMAL HIGH (ref 70–99)
Glucose-Capillary: 164 mg/dL — ABNORMAL HIGH (ref 70–99)

## 2021-02-16 LAB — COMPREHENSIVE METABOLIC PANEL
ALT: 26 U/L (ref 0–44)
AST: 24 U/L (ref 15–41)
Albumin: 3 g/dL — ABNORMAL LOW (ref 3.5–5.0)
Alkaline Phosphatase: 109 U/L (ref 38–126)
Anion gap: 8 (ref 5–15)
BUN: 20 mg/dL (ref 8–23)
CO2: 20 mmol/L — ABNORMAL LOW (ref 22–32)
Calcium: 8.9 mg/dL (ref 8.9–10.3)
Chloride: 108 mmol/L (ref 98–111)
Creatinine, Ser: 1.2 mg/dL — ABNORMAL HIGH (ref 0.44–1.00)
GFR, Estimated: 50 mL/min — ABNORMAL LOW (ref >60)
Glucose, Bld: 94 mg/dL (ref 70–99)
Potassium: 3.5 mmol/L (ref 3.5–5.1)
Sodium: 136 mmol/L (ref 135–145)
Total Bilirubin: 0.8 mg/dL (ref 0.3–1.2)
Total Protein: 6.1 g/dL — ABNORMAL LOW (ref 6.5–8.1)

## 2021-02-16 LAB — MAGNESIUM: Magnesium: 1.8 mg/dL (ref 1.7–2.4)

## 2021-02-16 LAB — BRAIN NATRIURETIC PEPTIDE: B Natriuretic Peptide: 39.5 pg/mL (ref 0.0–100.0)

## 2021-02-16 LAB — AMMONIA: Ammonia: 37 umol/L — ABNORMAL HIGH (ref 9–35)

## 2021-02-16 MED ORDER — LACTULOSE 10 GM/15ML PO SOLN: 20.0000 g | Freq: Every day | ORAL | 0 refills | Status: DC

## 2021-02-16 MED ORDER — LACTULOSE 10 GM/15ML PO SOLN
20.0000 g | Freq: Two times a day (BID) | ORAL | Status: DC
  Administered 2021-02-16: 20 g via ORAL
  Filled 2021-02-16: qty 30

## 2021-02-16 MED ORDER — VITAMIN B-12 1000 MCG PO TABS: 1000.0000 ug | ORAL_TABLET | Freq: Every day | ORAL | 0 refills | Status: AC
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
## 2021-02-16 NOTE — TOC Transition Note (Addendum)
Transition of Care [facility]) - CM/SW Discharge Note   Patient Details  Name: [name] MRN: [id number] Date of Birth: 11-Sep-1955  Transition of Care [facility]) CM/SW Contact:  Verdell Carmine, RN Phone Number: 02/16/2021, 10:45 AM   Clinical Narrative:    Not Active with [facility], Last know active with [facility], contacted Lattie Haw from [facility] to confirm.  1051: Rolling walker with 5" mwheels and 3:1 ordered for patient. She states she will need transport home, and is fine for [facility] to resume care at home.   Final next level of care: [place] Barriers to Discharge: No Barriers Identified   Patient Goals and CMS Choice        Discharge Placement  Home withHome Health                     Discharge Plan and [place] with home health, DME                      HH Arranged: RN, PT [facility] Agency: [facility] Date [place]: 02/16/21 Time [place]: 2800 Representative spoke with at [place]: [place] ([place]) Interventions     Readmission Risk Interventions Readmission Risk Prevention Plan 10/01/2019 07/18/2019 07/16/2019  Transportation Screening Complete Complete Complete  Medication Review Press photographer) Complete Complete Complete  PCP or Specialist appointment within 3-5 days of discharge Complete Complete -  [place] or Home Care Consult Complete Complete Complete  SW Recovery Care/Counseling Consult Complete Complete -  Palliative Care Screening Not Applicable Not Applicable Not [place] - Not Applicable Patient Refused  Some recent data might be hidden

## 2021-02-16 NOTE — Telephone Encounter (Signed)
Transition Care Management Follow-up Telephone Call Date of discharge and from where: 02/16/2021, [facility]  How have you been since you were released from the hospital? Patient is feeling better.  Any questions or concerns? No  Items Reviewed: Did the pt receive and understand the discharge instructions provided? Yes  Medications obtained and verified? Yes  Other? No  Any new allergies since your discharge? No  Dietary orders reviewed? Yes Do you have support at home? Yes   Home Care and Equipment/Supplies: Were home health services ordered? not applicable If so, what is the name of the agency? N/A  Has the agency set up a time to come to the patient's home? not applicable Were any new equipment or medical supplies ordered?  No What is the name of the medical supply agency? N/A Were you able to get the supplies/equipment? not applicable Do you have any questions related to the use of the equipment or supplies? No  Functional Questionnaire: (I = Independent and D = Dependent) ADLs: I  Bathing/Dressing- I  Meal Prep- I  Eating- I  Maintaining continence- I  Transferring/Ambulation- I  Managing Meds- I  Follow up appointments reviewed:  PCP Hospital f/u appt confirmed? Yes  Scheduled to see Alma Friendly, NP on 02/17/2021 @ 11:40 am. [facility] f/u appt confirmed?  Follow up with cardiology   Are transportation arrangements needed? No  If their condition worsens, is the pt aware to call PCP or go to the Emergency Dept.? Yes Was the patient provided with contact information for the PCP's office or ED? Yes Was to pt encouraged to call back with questions or concerns? Yes

## 2021-02-16 NOTE — TOC Transition Note (Signed)
Transition of Care [facility]) - CM/SW Discharge Note   Patient Details  Name: DIYA GERVASI MRN: [id number] Date of Birth: 06/18/1956  Transition of Care [facility]) CM/SW Contact:  Verdell Carmine, RN Phone Number: 02/16/2021, 10:35 AM   Clinical Narrative:     [facility] is [facility] with [facility] requesting continuation of services. Contacted Ken[place] at [facility].   Final next level of care: [place] Barriers to Discharge: No Barriers Identified   Patient Goals and CMS Choice        Discharge Placement             Home with Home health service          Discharge Plan and Services                          HH Arranged: RN, PT [facility] Agency: [place] ([facility]) Date [place]: 02/16/21 Time [place]: 1035 Representative spoke with at [place]: [place] ([place]) Interventions     Readmission Risk Interventions Readmission Risk Prevention Plan 10/01/2019 07/18/2019 07/16/2019  Transportation Screening Complete Complete Complete  Medication Review Press photographer) Complete Complete Complete  PCP or Specialist appointment within 3-5 days of discharge Complete Complete -  [place] or Home Care Consult Complete Complete Complete  SW Recovery Care/Counseling Consult Complete Complete -  Palliative Care Screening Not Applicable Not Applicable Not [place] - Not Applicable Patient Refused  Some recent data might be hidden

## 2021-02-16 NOTE — Progress Notes (Signed)
DISCHARGE NOTE HOME RAUSHANAH OSMUNDSON to be discharged Home per MD order. Discussed prescriptions and follow up appointments with the patient. Prescriptions given to patient; medication list explained in detail. Patient verbalized understanding.  Skin clean, dry and intact without evidence of skin break down, no evidence of skin tears noted. IV catheter discontinued intact. Site without signs and symptoms of complications. Dressing and pressure applied. Pt denies pain at the site currently. No complaints noted.  Patient free of lines, drains, and wounds.   An After Visit Summary (AVS) was printed and given to the patient. Patient escorted via wheelchair, and discharged home via private auto.  Orville Govern, RN

## 2021-02-17 ENCOUNTER — Telehealth: Payer: Self-pay

## 2021-02-17 ENCOUNTER — Ambulatory Visit: Payer: Medicare Other | Admitting: Primary Care

## 2021-02-17 DIAGNOSIS — E1122 Type 2 diabetes mellitus with diabetic chronic kidney disease: Secondary | ICD-10-CM

## 2021-02-17 DIAGNOSIS — I25119 Atherosclerotic heart disease of native coronary artery with unspecified angina pectoris: Secondary | ICD-10-CM

## 2021-02-17 DIAGNOSIS — I639 Cerebral infarction, unspecified: Secondary | ICD-10-CM

## 2021-02-17 DIAGNOSIS — G9340 Encephalopathy, unspecified: Secondary | ICD-10-CM

## 2021-02-17 DIAGNOSIS — I10 Essential (primary) hypertension: Secondary | ICD-10-CM

## 2021-02-17 DIAGNOSIS — N179 Acute kidney failure, unspecified: Secondary | ICD-10-CM

## 2021-02-17 NOTE — Telephone Encounter (Signed)
Pt called and canceled the 1140 appt she had this am due to transportation issues. She is a hospital follow-up and today's appt was 40 minutes. I could not find 40 minutes together next week to reschedule. Will send to kate to see where she can be placed. Please advise at [phone]

## 2021-02-17 NOTE — Telephone Encounter (Signed)
Per our last phone note we discussed that if she cancelled or no showed again she would be up for dismissal. She is a very frail patient requiring close monitoring and does not follow up as recommended despite the transportation we have arranged for her.   I just refilled her insulin last week and she assured [name] that she had transportation and would come today. She cancelled just prior to her appointment time.   Can we tally up how many times she's either no showed or cancelled with me for 2022? From quick glance she has done the same with other appointments in the past and also in the future.   My inclination is dismissal as we've warned her several times regarding her no shows and cancellations. Do we have a case for dismissal?

## 2021-02-18 LAB — VITAMIN B1: Vitamin B1 (Thiamine): 72.6 nmol/L (ref 66.5–200.0)

## 2021-02-20 LAB — CULTURE, BLOOD (ROUTINE X 2)
Culture: NO GROWTH
Culture: NO GROWTH
Special Requests: ADEQUATE

## 2021-02-20 NOTE — Telephone Encounter (Signed)
These are all of the cancelled or NS apts for 2022. There is  total of 8 missed apts for 2022 03/12/75 NS 1/84/85 Public Health Emergency 10/28/20 Transportation 11/29/20 NS 12/28/20 NS 01/26/21 NS 02/03/21 weather 02/17/21 transportation

## 2021-02-21 NOTE — Telephone Encounter (Signed)
Forwarding msg to Engineer, building services to proceed with dismissal.

## 2021-02-21 NOTE — Telephone Encounter (Signed)
Let's proceed with dismissal.   See phone note from 02/03/21 where patient was warned about missing further appointments and the risk of dismissal.

## 2021-02-22 ENCOUNTER — Emergency Department: Payer: Medicare Other

## 2021-02-22 ENCOUNTER — Encounter: Payer: Self-pay | Admitting: Emergency Medicine

## 2021-02-22 ENCOUNTER — Inpatient Hospital Stay
Admission: EM | Admit: 2021-02-22 | Discharge: 2021-02-27 | DRG: 871 | Disposition: A | Discharge status: Home Health | Payer: Medicare Other | Attending: Internal Medicine | Admitting: Internal Medicine

## 2021-02-22 ENCOUNTER — Other Ambulatory Visit: Payer: Self-pay

## 2021-02-22 DIAGNOSIS — E876 Hypokalemia: Secondary | ICD-10-CM

## 2021-02-22 DIAGNOSIS — R296 Repeated falls: Secondary | ICD-10-CM | POA: Diagnosis not present

## 2021-02-22 DIAGNOSIS — K7460 Unspecified cirrhosis of liver: Secondary | ICD-10-CM | POA: Diagnosis not present

## 2021-02-22 DIAGNOSIS — E162 Hypoglycemia, unspecified: Secondary | ICD-10-CM

## 2021-02-22 DIAGNOSIS — N179 Acute kidney failure, unspecified: Secondary | ICD-10-CM | POA: Diagnosis present

## 2021-02-22 DIAGNOSIS — F32A Depression, unspecified: Secondary | ICD-10-CM | POA: Diagnosis present

## 2021-02-22 DIAGNOSIS — E1122 Type 2 diabetes mellitus with diabetic chronic kidney disease: Secondary | ICD-10-CM | POA: Diagnosis present

## 2021-02-22 DIAGNOSIS — N390 Urinary tract infection, site not specified: Secondary | ICD-10-CM | POA: Diagnosis not present

## 2021-02-22 DIAGNOSIS — Z888 Allergy status to other drugs, medicaments and biological substances status: Secondary | ICD-10-CM

## 2021-02-22 DIAGNOSIS — Z885 Allergy status to narcotic agent status: Secondary | ICD-10-CM | POA: Diagnosis not present

## 2021-02-22 DIAGNOSIS — R079 Chest pain, unspecified: Secondary | ICD-10-CM | POA: Diagnosis not present

## 2021-02-22 DIAGNOSIS — I480 Paroxysmal atrial fibrillation: Secondary | ICD-10-CM | POA: Diagnosis present

## 2021-02-22 DIAGNOSIS — I129 Hypertensive chronic kidney disease with stage 1 through stage 4 chronic kidney disease, or unspecified chronic kidney disease: Secondary | ICD-10-CM | POA: Diagnosis present

## 2021-02-22 DIAGNOSIS — J40 Bronchitis, not specified as acute or chronic: Secondary | ICD-10-CM | POA: Diagnosis present

## 2021-02-22 DIAGNOSIS — R55 Syncope and collapse: Secondary | ICD-10-CM

## 2021-02-22 DIAGNOSIS — B952 Enterococcus as the cause of diseases classified elsewhere: Secondary | ICD-10-CM | POA: Diagnosis present

## 2021-02-22 DIAGNOSIS — Z794 Long term (current) use of insulin: Secondary | ICD-10-CM

## 2021-02-22 DIAGNOSIS — I517 Cardiomegaly: Secondary | ICD-10-CM | POA: Diagnosis not present

## 2021-02-22 DIAGNOSIS — R410 Disorientation, unspecified: Secondary | ICD-10-CM | POA: Diagnosis not present

## 2021-02-22 DIAGNOSIS — K7469 Other cirrhosis of liver: Secondary | ICD-10-CM | POA: Diagnosis not present

## 2021-02-22 DIAGNOSIS — E039 Hypothyroidism, unspecified: Secondary | ICD-10-CM | POA: Diagnosis not present

## 2021-02-22 DIAGNOSIS — E538 Deficiency of other specified B group vitamins: Secondary | ICD-10-CM | POA: Diagnosis not present

## 2021-02-22 DIAGNOSIS — E10649 Type 1 diabetes mellitus with hypoglycemia without coma: Secondary | ICD-10-CM | POA: Diagnosis not present

## 2021-02-22 DIAGNOSIS — I6782 Cerebral ischemia: Secondary | ICD-10-CM | POA: Diagnosis not present

## 2021-02-22 DIAGNOSIS — K766 Portal hypertension: Secondary | ICD-10-CM | POA: Diagnosis not present

## 2021-02-22 DIAGNOSIS — E161 Other hypoglycemia: Secondary | ICD-10-CM | POA: Diagnosis not present

## 2021-02-22 DIAGNOSIS — I499 Cardiac arrhythmia, unspecified: Secondary | ICD-10-CM | POA: Diagnosis not present

## 2021-02-22 DIAGNOSIS — A4150 Gram-negative sepsis, unspecified: Secondary | ICD-10-CM | POA: Diagnosis not present

## 2021-02-22 DIAGNOSIS — Z886 Allergy status to analgesic agent status: Secondary | ICD-10-CM

## 2021-02-22 DIAGNOSIS — R062 Wheezing: Secondary | ICD-10-CM

## 2021-02-22 DIAGNOSIS — Z887 Allergy status to serum and vaccine status: Secondary | ICD-10-CM

## 2021-02-22 DIAGNOSIS — A419 Sepsis, unspecified organism: Principal | ICD-10-CM | POA: Diagnosis present

## 2021-02-22 DIAGNOSIS — Z20822 Contact with and (suspected) exposure to covid-19: Secondary | ICD-10-CM | POA: Diagnosis not present

## 2021-02-22 DIAGNOSIS — E1165 Type 2 diabetes mellitus with hyperglycemia: Secondary | ICD-10-CM

## 2021-02-22 DIAGNOSIS — N189 Chronic kidney disease, unspecified: Secondary | ICD-10-CM | POA: Diagnosis not present

## 2021-02-22 DIAGNOSIS — I252 Old myocardial infarction: Secondary | ICD-10-CM

## 2021-02-22 DIAGNOSIS — Z90710 Acquired absence of both cervix and uterus: Secondary | ICD-10-CM

## 2021-02-22 DIAGNOSIS — Z9049 Acquired absence of other specified parts of digestive tract: Secondary | ICD-10-CM

## 2021-02-22 DIAGNOSIS — Z8673 Personal history of transient ischemic attack (TIA), and cerebral infarction without residual deficits: Secondary | ICD-10-CM

## 2021-02-22 DIAGNOSIS — Z743 Need for continuous supervision: Secondary | ICD-10-CM | POA: Diagnosis not present

## 2021-02-22 DIAGNOSIS — E11649 Type 2 diabetes mellitus with hypoglycemia without coma: Secondary | ICD-10-CM | POA: Diagnosis present

## 2021-02-22 DIAGNOSIS — D631 Anemia in chronic kidney disease: Secondary | ICD-10-CM | POA: Diagnosis not present

## 2021-02-22 DIAGNOSIS — N1831 Chronic kidney disease, stage 3a: Secondary | ICD-10-CM | POA: Diagnosis not present

## 2021-02-22 DIAGNOSIS — G9341 Metabolic encephalopathy: Secondary | ICD-10-CM | POA: Diagnosis not present

## 2021-02-22 DIAGNOSIS — Z91040 Latex allergy status: Secondary | ICD-10-CM

## 2021-02-22 DIAGNOSIS — Z8542 Personal history of malignant neoplasm of other parts of uterus: Secondary | ICD-10-CM

## 2021-02-22 DIAGNOSIS — Z79890 Hormone replacement therapy (postmenopausal): Secondary | ICD-10-CM

## 2021-02-22 DIAGNOSIS — E785 Hyperlipidemia, unspecified: Secondary | ICD-10-CM | POA: Diagnosis present

## 2021-02-22 DIAGNOSIS — G9340 Encephalopathy, unspecified: Secondary | ICD-10-CM | POA: Diagnosis present

## 2021-02-22 DIAGNOSIS — Z8249 Family history of ischemic heart disease and other diseases of the circulatory system: Secondary | ICD-10-CM

## 2021-02-22 DIAGNOSIS — Z79899 Other long term (current) drug therapy: Secondary | ICD-10-CM

## 2021-02-22 DIAGNOSIS — Z87891 Personal history of nicotine dependence: Secondary | ICD-10-CM

## 2021-02-22 DIAGNOSIS — J329 Chronic sinusitis, unspecified: Secondary | ICD-10-CM | POA: Diagnosis not present

## 2021-02-22 DIAGNOSIS — R0789 Other chest pain: Secondary | ICD-10-CM | POA: Diagnosis not present

## 2021-02-22 DIAGNOSIS — R7989 Other specified abnormal findings of blood chemistry: Secondary | ICD-10-CM | POA: Diagnosis not present

## 2021-02-22 DIAGNOSIS — R404 Transient alteration of awareness: Secondary | ICD-10-CM | POA: Diagnosis not present

## 2021-02-22 LAB — COMPREHENSIVE METABOLIC PANEL
ALT: 33 U/L (ref 0–44)
AST: 51 U/L — ABNORMAL HIGH (ref 15–41)
Albumin: 3.7 g/dL (ref 3.5–5.0)
Alkaline Phosphatase: 129 U/L — ABNORMAL HIGH (ref 38–126)
Anion gap: 7 (ref 5–15)
BUN: 36 mg/dL — ABNORMAL HIGH (ref 8–23)
CO2: 21 mmol/L — ABNORMAL LOW (ref 22–32)
Calcium: 8.8 mg/dL — ABNORMAL LOW (ref 8.9–10.3)
Chloride: 109 mmol/L (ref 98–111)
Creatinine, Ser: 1.52 mg/dL — ABNORMAL HIGH (ref 0.44–1.00)
GFR, Estimated: 37 mL/min — ABNORMAL LOW (ref >60)
Glucose, Bld: 166 mg/dL — ABNORMAL HIGH (ref 70–99)
Potassium: 3 mmol/L — ABNORMAL LOW (ref 3.5–5.1)
Sodium: 137 mmol/L (ref 135–145)
Total Bilirubin: 1.1 mg/dL (ref 0.3–1.2)
Total Protein: 7.1 g/dL (ref 6.5–8.1)

## 2021-02-22 LAB — CBC WITH DIFFERENTIAL/PLATELET
Abs Immature Granulocytes: 0.05 10*3/uL (ref 0.00–0.07)
Basophils Absolute: 0 10*3/uL (ref 0.0–0.1)
Basophils Relative: 0 %
Eosinophils Absolute: 0.1 10*3/uL (ref 0.0–0.5)
Eosinophils Relative: 1 %
HCT: 35.2 % — ABNORMAL LOW (ref 36.0–46.0)
Hemoglobin: 11.4 g/dL — ABNORMAL LOW (ref 12.0–15.0)
Immature Granulocytes: 1 %
Lymphocytes Relative: 9 %
Lymphs Abs: 0.7 10*3/uL (ref 0.7–4.0)
MCH: 27.9 pg (ref 26.0–34.0)
MCHC: 32.4 g/dL (ref 30.0–36.0)
MCV: 86.3 fL (ref 80.0–100.0)
Monocytes Absolute: 0.5 10*3/uL (ref 0.1–1.0)
Monocytes Relative: 6 %
Neutro Abs: 7.1 10*3/uL (ref 1.7–7.7)
Neutrophils Relative %: 83 %
Platelets: 165 10*3/uL (ref 150–400)
RBC: 4.08 MIL/uL (ref 3.87–5.11)
RDW: 15.9 % — ABNORMAL HIGH (ref 11.5–15.5)
WBC: 8.4 10*3/uL (ref 4.0–10.5)
nRBC: 0 % (ref 0.0–0.2)

## 2021-02-22 LAB — D-DIMER, QUANTITATIVE: D-Dimer, Quant: 0.79 ug/mL-FEU — ABNORMAL HIGH (ref 0.00–0.50)

## 2021-02-22 LAB — CBG MONITORING, ED
Glucose-Capillary: 162 mg/dL — ABNORMAL HIGH (ref 70–99)
Glucose-Capillary: 186 mg/dL — ABNORMAL HIGH (ref 70–99)

## 2021-02-22 LAB — RESP PANEL BY RT-PCR (FLU A&B, COVID) ARPGX2
Influenza A by PCR: NEGATIVE
Influenza B by PCR: NEGATIVE
SARS Coronavirus 2 by RT PCR: NEGATIVE

## 2021-02-22 LAB — TROPONIN I (HIGH SENSITIVITY): Troponin I (High Sensitivity): 6 ng/L (ref <18)

## 2021-02-22 MED ORDER — POTASSIUM CHLORIDE CRYS ER 20 MEQ PO TBCR
40.0000 meq | EXTENDED_RELEASE_TABLET | Freq: Once | ORAL | Status: AC
  Administered 2021-02-22: 40 meq via ORAL
  Filled 2021-02-22: qty 2

## 2021-02-22 MED ORDER — IOHEXOL 350 MG/ML SOLN
60.0000 mL | Freq: Once | INTRAVENOUS | Status: AC | PRN
  Administered 2021-02-22: 60 mL via INTRAVENOUS

## 2021-02-22 MED ORDER — LACTATED RINGERS IV BOLUS
1000.0000 mL | Freq: Once | INTRAVENOUS | Status: AC
  Administered 2021-02-22: 1000 mL via INTRAVENOUS

## 2021-02-22 MED ORDER — MORPHINE SULFATE (PF) 4 MG/ML IV SOLN
4.0000 mg | Freq: Once | INTRAVENOUS | Status: AC
Start: 2021-02-22 — End: 2021-02-22
  Administered 2021-02-22: 4 mg via INTRAVENOUS
  Filled 2021-02-22: qty 1

## 2021-02-22 NOTE — ED Notes (Signed)
Pt purewick in place to wall suction

## 2021-02-22 NOTE — ED Provider Notes (Signed)
[facility] Emergency Department Provider Note ____________________________________________   Event Date/Time   First MD Initiated Contact with Patient 02/22/21 2001     (approximate)  I have reviewed the triage vital signs and the nursing notes.  HISTORY  Chief Complaint Hypoglycemia   HPI [name] is a 65 y.o. femalewho presents to the ED for evaluation of hypoglycemia and chest pain.  Chart review indicates recent admission 6/4-6/9 due to altered mentation attributed to metabolic encephalopathy in the setting of UTI and CAP, treated with Rocephin and Doxy.  Discharged with doxycycline. Otherwise history of HTN, HLD, CKD 3, DM on insulin.   Patient presents to the ED from home via EMS for evaluation of hypoglycemia, then reporting chest pain.  She was reportedly found unconscious by her husband, with a glucose in the 30s when EMS arrived.  EMS provided IM glucagon and D10 infusion with improvement of her blood glucose levels and mental status.    On arrival to the ED she reports that she does not know what happened.  She is reporting chest pain that is substernal.  She cannot elaborate on how long this pain has been present.  She denies recent illnesses since her recent hospitalization.  Denies taking too much insulin on purpose or suicidality.  Denies falls or injuries.  History somewhat limited due to her altered mentation.  Later history obtained after she is more cogent, indicates that she had a syncopal episode at home and reports awakening with chest and thoracic back pain that is new.  Past Medical History:  Diagnosis Date   Acute encephalopathy 05/22/2018   Allergy    Anginal pain (HCC)    Anxiety    Ascites    C. difficile colitis 07/10/2015   Cancer ([place])    HX OF CANCER OF UTERUS    Chronic kidney disease    States she only has 1 working kidney   Cirrhosis of liver not due to alcohol ([place]) 2016   Degenerative disk disease     Diverticulitis    Gastroparesis    GERD (gastroesophageal reflux disease)    History of hiatal hernia    Hypertension    Hypothyroid    Hypothyroidism due to amiodarone    Ileus ([place]) 08/01/2015   Intussusception intestine ([place]) 05/2015   Orthostatic hypotension    PAF (paroxysmal atrial fibrillation) ([place]) 03/2015   a. new onset 03/2015 in setting of intractable N/V; b. on Eliquis 5 mg bid; c. CHADSVASc 4 (DM, TIA x 2, female)   Pancreatitis    Pneumonia 11/14/2015   Pneumonia 07/2020   Right ureteral stone 07/14/2016   Sepsis ([place]) 12/15/2017   Sick sinus syndrome ([place])    Stomach ulcer    Stroke ([place])    with minimal left sided weakness   Syncope 01/2015   Syncope due to orthostatic hypotension 05/18/2015   Tachyarrhythmia 01/10/2016   TIA (transient ischemic attack) 02/2015   Type 1 diabetes ([place])    on levemir   UTERINE CANCER, HX OF 03/27/2007   Qualifier: Diagnosis of  By: Maxie Better FNP, Rosalita Levan    UTI (urinary tract infection) 05/22/2018    Patient Active Problem List   Diagnosis Date Noted   Encephalopathy 02/11/2021   Cervicalgia 01/11/2021   Cervical facet syndrome 01/11/2021   Cervical facet arthropathy 01/11/2021   Lumbosacral facet arthropathy 01/11/2021   Recurrent falls while walking 01/11/2021   Chronic low back pain (1ry area of Pain) (Bilateral) w/o sciatica 01/11/2021  Acute encephalopathy 12/13/2020   History of substance use disorder 12/12/2020   Substance use disorder 12/12/2020   Hematuria 10/27/2020   Hemoptysis 10/27/2020   Weakness 09/28/2020   COVID-19 virus infection    Stroke ([place]) 09/27/2020   Hypoglycemia 08/27/2020   CKD (chronic kidney disease), stage III ([place]) 08/27/2020   Shortness of breath 07/16/2020   Candidal intertrigo 07/16/2020   Sepsis ([place]) 11/23/2019   Acute pyelonephritis 09/30/2019   Acute pain of left foot 08/18/2019   Type II diabetes mellitus with renal manifestations ([place]) 08/13/2019   Weakness of both lower  extremities 07/31/2019   Altered mental status 07/14/2019   GAD (generalized anxiety disorder) 06/11/2019   Poor memory 06/11/2019   Constipation 05/25/2019   Acute medial meniscus tear of left knee 05/25/2019   AKI (acute kidney injury) ([place]) 05/16/2019   Closed fracture of left distal fibula 05/16/2019   Sinus tachycardia 05/05/2019   Congestive dilated cardiomyopathy ([place]) 04/14/2019   CAD (coronary artery disease) 04/10/2019   Closed fracture of lateral malleolus 04/10/2019   Lumbar facet syndrome (Bilateral) (R>L) 03/09/2019   Long term (current) use of anticoagulants 03/09/2019   Unspecified cirrhosis of liver ([place]) 02/11/2019   Hypoalbuminemia 02/11/2019   Edema due to hypoalbuminemia 02/11/2019   Lower extremity edema 01/26/2019   Prolonged QT interval 12/17/2018   Hypomagnesemia 12/17/2018   Uncontrolled type 2 diabetes mellitus ([place]) 12/17/2018   H/O TIA (transient ischemic attack) and stroke 12/17/2018   Personal history of surgery to heart and great vessels, presenting hazards to health 12/17/2018   DDD (degenerative disc disease), lumbar 12/17/2018   Lumbar intervertebral disc displacement 12/17/2018   Chronic musculoskeletal pain 12/17/2018   Chronic generalized pain 12/17/2018   Hyponatremia 12/17/2018   Fibromyalgia syndrome 12/17/2018   Other intervertebral disc displacement, lumbar region 12/17/2018   Vitamin D deficiency 11/26/2018   Elevated C-reactive protein (CRP) 11/26/2018   Elevated sed rate 11/26/2018   Chronic low back pain (1ry area of Pain) (Bilateral) (L>R) w/ sciatica (Bilateral) 11/25/2018   Chronic lower extremity pain (2ry area of Pain) (Bilateral) (L>R) 11/25/2018   Chronic neck pain 11/25/2018   Pharmacologic therapy 11/25/2018   Disorder of skeletal system 11/25/2018   Problems influencing health status 11/25/2018   Renal mass 10/06/2018   Chronic intermittent abdominal pain 09/25/2018   Unspecified abdominal pain 09/25/2018   Frequent  falls 09/12/2018   Orthostatic hypotension 08/18/2018   Normochromic normocytic anemia 08/18/2018   Acute renal failure superimposed on stage 3a chronic kidney disease ([place]) 08/18/2018   History of hypothyroidism 08/14/2018   Medical non-compliance 08/14/2018   Diabetic ketoacidosis ([place]) 05/22/2018   Urinary tract infection, acute 05/22/2018   Diarrhea    NSTEMI (non-ST elevated myocardial infarction) ([place]) 01/11/2016   Tachy-brady syndrome ([place]) 12/28/2015   PAF (paroxysmal atrial fibrillation) ([place]) 11/24/2015   Type 2 diabetes mellitus with neurologic complication ([place]) 16/06/9603   S/P cholecystectomy 11/23/2015   Narcotic abuse ([place]) 11/22/2015   Chronic superficial gastritis without bleeding 11/22/2015   History of Clostridium difficile 11/22/2015   History of TIA (transient ischemic attack) 11/22/2015   Mixed hyperlipidemia 11/22/2015   Diverticulosis 11/22/2015   History of uterine cancer 11/22/2015   Hypothyroidism, unspecified 11/22/2015   Intractable cyclical vomiting with nausea 11/22/2015   Community acquired pneumonia of left lower lobe of lung 11/14/2015   Narcotic withdrawal ([place]) 11/11/2015   Hyperlipidemia 08/31/2015   Hypotension 08/06/2015   Cryptogenic cirrhosis ([place]) 07/10/2015   Atrial fibrillation ([place]) 07/10/2015   Malnutrition  of moderate degree 07/04/2015   Symptomatic bradycardia 05/27/2015   Chronic anemia 05/19/2015   Thrombocytopenia ([place]) 05/19/2015   Hypokalemia 04/06/2015   Hyperlipidemia with target LDL less than 100 04/06/2015   Syncope 03/11/2015   CVA (cerebral vascular accident) ([place]) 02/15/2015   Essential hypertension 01/12/2015   Chronically on opiate therapy 01/12/2015   Cirrhosis of liver not due to alcohol ([place]) 2016   S/P Nissen fundoplication (without gastrostomy tube) procedure 05/11/2014   Dysphagia 04/19/2014   Myofascial pain syndrome 06/27/2007   Chronic pain syndrome 03/28/2007   GERD 03/27/2007   Diverticulosis of  large intestine without perforation or abscess without bleeding 03/27/2007   Proteinuria 03/27/2007    Past Surgical History:  Procedure Laterality Date   ABDOMINAL HYSTERECTOMY     CARDIAC CATHETERIZATION N/A 01/12/2016   Procedure: Left Heart Cath and Coronary Angiography;  Surgeon: Wellington Hampshire, MD;  Location: [facility];  Service: Cardiovascular;  Laterality: N/A;   CHOLECYSTECTOMY     CYSTOSCOPY W/ RETROGRADES Bilateral 12/11/2019   Procedure: CYSTOSCOPY WITH RETROGRADE PYELOGRAM;  Surgeon: Billey Co, MD;  Location: [facility] ORS;  Service: Urology;  Laterality: Bilateral;   CYSTOSCOPY WITH BIOPSY N/A 12/11/2019   Procedure: CYSTOSCOPY WITH BLADDER BIOPSY;  Surgeon: Billey Co, MD;  Location: [facility];  Service: Urology;  Laterality: N/A;   CYSTOSCOPY WITH FULGERATION N/A 12/11/2019   Procedure: CYSTOSCOPY WITH FULGERATION;  Surgeon: Billey Co, MD;  Location: [facility];  Service: Urology;  Laterality: N/A;   CYSTOSCOPY WITH STENT PLACEMENT Right 12/11/2019   Procedure: CYSTOSCOPY WITH STENT PLACEMENT;  Surgeon: Billey Co, MD;  Location: [facility];  Service: Urology;  Laterality: Right;   CYSTOSCOPY WITH URETHRAL DILATATION Right 12/11/2019   Procedure: CYSTOSCOPY WITH URETERAL DILATATION;  Surgeon: Billey Co, MD;  Location: [facility];  Service: Urology;  Laterality: Right;   CYSTOSCOPY/URETEROSCOPY/HOLMIUM LASER Right 07/14/2016   Procedure: CYSTOSCOPY/URETEROSCOPY/HOLMIUM LASER;  Surgeon: Alexis Frock, MD;  Location: [facility];  Service: Urology;  Laterality: Right;   ESOPHAGOGASTRODUODENOSCOPY N/A 04/04/2015   Procedure: ESOPHAGOGASTRODUODENOSCOPY (EGD);  Surgeon: Hulen Luster, MD;  Location: [facility] ENDOSCOPY;  Service: Endoscopy;  Laterality: N/A;   ESOPHAGOGASTRODUODENOSCOPY N/A 12/28/2017   Procedure: ESOPHAGOGASTRODUODENOSCOPY (EGD);  Surgeon: Lin Landsman, MD;  Location: [facility] ENDOSCOPY;  Service: Gastroenterology;  Laterality: N/A;    ESOPHAGOGASTRODUODENOSCOPY (EGD) WITH PROPOFOL N/A 01/18/2016   Procedure: ESOPHAGOGASTRODUODENOSCOPY (EGD) WITH PROPOFOL;  Surgeon: Lucilla Lame, MD;  Location: [facility] ENDOSCOPY;  Service: Endoscopy;  Laterality: N/A;   FLEXIBLE SIGMOIDOSCOPY N/A 01/18/2016   Procedure: FLEXIBLE SIGMOIDOSCOPY;  Surgeon: Lucilla Lame, MD;  Location: [facility] ENDOSCOPY;  Service: Endoscopy;  Laterality: N/A;   HERNIA REPAIR     URETEROSCOPY Right 12/11/2019   Procedure: Christen Butter;  Surgeon: Billey Co, MD;  Location: [facility] ORS;  Service: Urology;  Laterality: Right;    Prior to Admission medications   Medication Sig Start Date End Date Taking? Authorizing Provider  amLODipine (NORVASC) 10 MG tablet Take 1 tablet (10 mg total) by mouth daily. 02/16/21   Thurnell Lose, MD  carvedilol (COREG) 3.125 MG tablet Take 1 tablet (3.125 mg total) by mouth 2 (two) times daily with a meal. 02/15/21   Thurnell Lose, MD  docusate sodium (COLACE) 100 MG capsule Take 1 capsule (100 mg total) by mouth daily. 02/15/21 03/17/21  Thurnell Lose, MD  doxycycline (VIBRA-TABS) 100 MG tablet Take 1 tablet (100 mg total) by mouth every 12 (twelve) hours. 02/15/21   Candiss Norse,  Margaree Mackintosh, MD  insulin detemir (LEVEMIR FLEXTOUCH) 100 UNIT/ML FlexPen Inject 30 Units into the skin 2 (two) times daily. 02/10/21   [name], NP  insulin lispro (HUMALOG KWIKPEN) 100 UNIT/ML KwikPen Before each meal 3 times a day, 140-199 - 2 units, 200-250 - 4 units, 251-299 - 6 units,  300-349 - 8 units,  350 or above 10 units. Insulin PEN if approved, provide syringes and needles if needed. 02/15/21   Thurnell Lose, MD  Insulin Syringe-Needle U-100 25G X 1" 1 ML MISC For 4 times a day insulin SQ, 1 month supply. Diagnosis E11.65 02/15/21   Thurnell Lose, MD  lactulose (CHRONULAC) 10 GM/15ML solution Take 30 mLs (20 g total) by mouth daily. Skip if you have diarrhea. 02/16/21   Thurnell Lose, MD  Melatonin 10 MG CAPS Take 1 tablet by mouth at  bedtime as needed (sleep).    [provider]  polyethylene glycol (MIRALAX / GLYCOLAX) 17 g packet Take 17 g by mouth 2 (two) times daily. 02/15/21   Thurnell Lose, MD  sertraline (ZOLOFT) 50 MG tablet Take 1 tablet (50 mg total) by mouth daily. For anxiety and depression. 10/27/20   [name], NP  vitamin B-12 (CYANOCOBALAMIN) 1000 MCG tablet Take 1 tablet (1,000 mcg total) by mouth daily. 02/16/21   Thurnell Lose, MD    Allergies Latex, Aspirin, Codeine sulfate, Erythromycin, Prednisone, Rosiglitazone maleate, and Tetanus-diphtheria toxoids td  Family History  Problem Relation Age of Onset   Hypertension Mother    CAD Sister    Heart attack Sister        Deceased 12-03-14   CAD Brother     Social History Social History   Tobacco Use   Smoking status: Former    Pack years: 0.00    Types: Cigarettes   Smokeless tobacco: Never   Tobacco comments:    25 years ago and only smoked occasionally  Vaping Use   Vaping Use: Never used  Substance Use Topics   Alcohol use: No   Drug use: No    Review of Systems  Difficulty to be accurately assessed due to patient's disorientation ____________________________________________   PHYSICAL EXAM:  VITAL SIGNS: Vitals:   02/22/21 December 04, 2103 02/22/21 Dec 03, 2128  BP:  (!) 148/71  Pulse: 90 90  Resp: (!) 22 14  Temp:    SpO2: 100% 100%    Constitutional: Alert and oriented to self only. in no acute distress. Follows commands in all 4 extremities.  Frequently moaning when I ask her to move her extremities and for my neurologic exam. Eyes: Conjunctivae are normal. PERRL. EOMI. Head: Atraumatic. Nose: No congestion/rhinnorhea. Mouth/Throat: Mucous membranes are moist.  Oropharynx non-erythematous. Neck: No stridor. No cervical spine tenderness to palpation. Cardiovascular: Normal rate, regular rhythm. Grossly normal heart sounds.  Good peripheral circulation. Respiratory: Normal respiratory effort.  No retractions. Lungs  CTAB. Gastrointestinal: Soft , nondistended. No CVA tenderness. Musculoskeletal: No lower extremity tenderness nor edema.  No joint effusions.  Healing/subacute superficial abrasions to bilateral knees and bilateral shins without discrete laceration or evidence of more acute injury. Her substernal and left-sided chest pain is reproducible by palpation.  No overlying signs of trauma, skin changes. Neurologic:  Normal speech and language. No gross focal neurologic deficits are appreciated.  Skin:  Skin is warm, dry and intact. No rash noted. Psychiatric: Mood and affect are normal. Speech and behavior are normal. ____________________________________________   LABS (all labs ordered are listed, but only  abnormal results are displayed)  Labs Reviewed  CBC WITH DIFFERENTIAL/PLATELET - Abnormal; Notable for the following components:      Result Value   Hemoglobin 11.4 (*)    HCT 35.2 (*)    RDW 15.9 (*)    All other components within normal limits  COMPREHENSIVE METABOLIC PANEL - Abnormal; Notable for the following components:   Potassium 3.0 (*)    CO2 21 (*)    Glucose, Bld 166 (*)    BUN 36 (*)    Creatinine, Ser 1.52 (*)    Calcium 8.8 (*)    AST 51 (*)    Alkaline Phosphatase 129 (*)    GFR, Estimated 37 (*)    All other components within normal limits  D-DIMER, QUANTITATIVE (NOT AT [facility]) - Abnormal; Notable for the following components:   D-Dimer, Quant 0.79 (*)    All other components within normal limits  CBG MONITORING, ED - Abnormal; Notable for the following components:   Glucose-Capillary 186 (*)    All other components within normal limits  RESP PANEL BY RT-PCR (FLU A&B, COVID) [id number]  URINALYSIS, COMPLETE (UACMP) WITH MICROSCOPIC  CBG MONITORING, ED  TROPONIN I (HIGH SENSITIVITY)  TROPONIN I (HIGH SENSITIVITY)   ____________________________________________  12 Lead EKG  Sinus rhythm, rate of 98 bpm.  Normal axis.  QTC 510.  No  STEMI. ____________________________________________  RADIOLOGY  ED MD interpretation: 2 view CXR reviewed by me without evidence of acute cardiopulmonary pathology. CT head reviewed by me without evidence of acute intracranial pathology.  Official radiology report(s): DG Chest 2 View  Result Date: 02/22/2021 CLINICAL DATA:  Chest pain.  Hypoglycemia.  Unresponsive. EXAM: CHEST - 2 VIEW COMPARISON:  02/12/2021 FINDINGS: Heart size and pulmonary vascularity are normal. Lungs are clear. Calcification of the aorta. Degenerative changes in the spine and shoulders. No pleural effusions. No pneumothorax. Mediastinal contours appear intact. IMPRESSION: No active cardiopulmonary disease. Electronically Signed   By: Lucienne Capers M.D.   On: 02/22/2021 20:32   CT Head Wo Contrast  Result Date: 02/22/2021 CLINICAL DATA:  Hypoglycemia altered mental status EXAM: CT HEAD WITHOUT CONTRAST TECHNIQUE: Contiguous axial images were obtained from the base of the skull through the vertex without intravenous contrast. COMPARISON:  MRI 02/12/2021, CT brain 02/11/2021 FINDINGS: Brain: No acute territorial infarction, hemorrhage, or intracranial mass. Atrophy and chronic small vessel ischemic changes of the white matter. Stable ventricle size. Vascular: No hyperdense vessels.  Carotid vascular calcification Skull: Normal. Negative for fracture or focal lesion. Sinuses/Orbits: Mucosal thickening in the sphenoid and ethmoid sinuses. Fluid in the sphenoid sinus. Left greater than right mastoid effusion Other: None IMPRESSION: 1. No CT evidence for acute intracranial abnormality. Atrophy and chronic small vessel ischemic changes of the white matter 2. Sinusitis.  Bilateral mastoid effusions. Electronically Signed   By: Donavan Foil M.D.   On: 02/22/2021 20:39    ____________________________________________   PROCEDURES and INTERVENTIONS  Procedure(s) performed (including Critical Care):  .1-3 Lead EKG  Interpretation  Date/Time: 02/22/2021 10:56 PM Performed by: Vladimir Crofts, MD Authorized by: Vladimir Crofts, MD     Interpretation: normal     ECG rate:  90   ECG rate assessment: normal     Rhythm: sinus rhythm     Ectopy: none     Conduction: normal    Medications  lactated ringers bolus 1,000 mL (1,000 mLs Intravenous New Bag/Given 02/22/21 2047)  potassium chloride SA (KLOR-CON) CR tablet 40 mEq (40 mEq Oral Given 02/22/21 2053)  morphine 4 MG/ML injection 4 mg (4 mg Intravenous Given 02/22/21 2106)    ____________________________________________   MDM / ED COURSE   65 year old woman presents from home poorly responsive in the setting of hypoglycemia, with complaints of chest pain and syncope after awakening, requiring CTA chest.  Tachycardic on arrival, otherwise normal vitals on room air.  Exam initially with a nonfocal somnolent patient, who improves with IV fluids and time.  Repeat CBG reassuring without recurrence of hypoglycemia.  Blood work with hypokalemia that was repleted orally, alongside a liter of Ringer's lactate.  Her mental status improved and her clinical picture is reassuring upon my reassessment, though she is reporting continued chest and thoracic back pain that is new.  While she is tender to palpation to her chest, with her hypokalemia, muscular spasm is a possibility.  Considering her coexisting syncope, concerned about the possibility of PE.  D-dimer was sent and returned positive, will send for CTA chest to assess for acute PE.  Patient signed out to oncoming provider to follow-up on the study and clinical reassessment of the patient.  Clinical Course as of 02/22/21 2259  Wed Feb 22, 2021  2053 Reassessed.  Patient looks clinically well.  Fluids ongoing.  Discussed hypokalemia with the patient. [DS]  2211 Reassessed.  Still looks clinically okay.  Heart rate in the 90s.  Still reporting chest pain going through to her back.  We will add a D-dimer to screen for  acute PE. [DS]    Clinical Course User Index [DS] Vladimir Crofts, MD    ____________________________________________   FINAL CLINICAL IMPRESSION(S) / ED DIAGNOSES  Final diagnoses:  Hypoglycemia  Hypokalemia  Other chest pain  Syncope, unspecified syncope type     ED Discharge Orders     None        Rozetta Stumpp   Note:  This document was prepared using Dragon voice recognition software and may include unintentional dictation errors.    Vladimir Crofts, MD 02/22/21 [phone]

## 2021-02-22 NOTE — ED Triage Notes (Signed)
Pt arrived via [facility] from home with c/o hypoglycemia Per EMS, pt was unresponsive to her husband so he called EMS. CBG was 38 on their arrival. Ems gave glucagon 10 mL R deltoid and 200 cc d10  Per EMS, last CBG was 262  Pt is alert but only oriented to self at this time.   Per EMS, hx of DM and possible hx of CHF

## 2021-02-23 ENCOUNTER — Other Ambulatory Visit: Payer: Self-pay

## 2021-02-23 ENCOUNTER — Encounter: Payer: Self-pay | Admitting: Family Medicine

## 2021-02-23 DIAGNOSIS — F32A Depression, unspecified: Secondary | ICD-10-CM | POA: Diagnosis present

## 2021-02-23 DIAGNOSIS — E11649 Type 2 diabetes mellitus with hypoglycemia without coma: Secondary | ICD-10-CM | POA: Diagnosis present

## 2021-02-23 DIAGNOSIS — N189 Chronic kidney disease, unspecified: Secondary | ICD-10-CM

## 2021-02-23 DIAGNOSIS — Z888 Allergy status to other drugs, medicaments and biological substances status: Secondary | ICD-10-CM | POA: Diagnosis not present

## 2021-02-23 DIAGNOSIS — K7460 Unspecified cirrhosis of liver: Secondary | ICD-10-CM | POA: Diagnosis present

## 2021-02-23 DIAGNOSIS — N1831 Chronic kidney disease, stage 3a: Secondary | ICD-10-CM | POA: Diagnosis present

## 2021-02-23 DIAGNOSIS — G9340 Encephalopathy, unspecified: Secondary | ICD-10-CM | POA: Diagnosis not present

## 2021-02-23 DIAGNOSIS — N179 Acute kidney failure, unspecified: Secondary | ICD-10-CM | POA: Diagnosis present

## 2021-02-23 DIAGNOSIS — E1122 Type 2 diabetes mellitus with diabetic chronic kidney disease: Secondary | ICD-10-CM | POA: Diagnosis present

## 2021-02-23 DIAGNOSIS — Z20822 Contact with and (suspected) exposure to covid-19: Secondary | ICD-10-CM | POA: Diagnosis present

## 2021-02-23 DIAGNOSIS — E162 Hypoglycemia, unspecified: Secondary | ICD-10-CM | POA: Diagnosis not present

## 2021-02-23 DIAGNOSIS — E876 Hypokalemia: Secondary | ICD-10-CM | POA: Diagnosis present

## 2021-02-23 DIAGNOSIS — I480 Paroxysmal atrial fibrillation: Secondary | ICD-10-CM | POA: Diagnosis present

## 2021-02-23 DIAGNOSIS — R296 Repeated falls: Secondary | ICD-10-CM | POA: Diagnosis present

## 2021-02-23 DIAGNOSIS — E1165 Type 2 diabetes mellitus with hyperglycemia: Secondary | ICD-10-CM | POA: Diagnosis not present

## 2021-02-23 DIAGNOSIS — I129 Hypertensive chronic kidney disease with stage 1 through stage 4 chronic kidney disease, or unspecified chronic kidney disease: Secondary | ICD-10-CM | POA: Diagnosis present

## 2021-02-23 DIAGNOSIS — A419 Sepsis, unspecified organism: Secondary | ICD-10-CM | POA: Diagnosis present

## 2021-02-23 DIAGNOSIS — K766 Portal hypertension: Secondary | ICD-10-CM | POA: Diagnosis present

## 2021-02-23 DIAGNOSIS — A4150 Gram-negative sepsis, unspecified: Secondary | ICD-10-CM | POA: Diagnosis not present

## 2021-02-23 DIAGNOSIS — E039 Hypothyroidism, unspecified: Secondary | ICD-10-CM | POA: Diagnosis present

## 2021-02-23 DIAGNOSIS — B952 Enterococcus as the cause of diseases classified elsewhere: Secondary | ICD-10-CM | POA: Diagnosis present

## 2021-02-23 DIAGNOSIS — J40 Bronchitis, not specified as acute or chronic: Secondary | ICD-10-CM | POA: Diagnosis present

## 2021-02-23 DIAGNOSIS — N390 Urinary tract infection, site not specified: Secondary | ICD-10-CM | POA: Diagnosis present

## 2021-02-23 DIAGNOSIS — Z885 Allergy status to narcotic agent status: Secondary | ICD-10-CM | POA: Diagnosis not present

## 2021-02-23 DIAGNOSIS — R0789 Other chest pain: Secondary | ICD-10-CM | POA: Diagnosis present

## 2021-02-23 DIAGNOSIS — G9341 Metabolic encephalopathy: Secondary | ICD-10-CM | POA: Diagnosis present

## 2021-02-23 DIAGNOSIS — D631 Anemia in chronic kidney disease: Secondary | ICD-10-CM | POA: Diagnosis present

## 2021-02-23 DIAGNOSIS — E538 Deficiency of other specified B group vitamins: Secondary | ICD-10-CM | POA: Diagnosis present

## 2021-02-23 DIAGNOSIS — K7469 Other cirrhosis of liver: Secondary | ICD-10-CM | POA: Diagnosis not present

## 2021-02-23 LAB — CK: Total CK: 42 U/L (ref 38–234)

## 2021-02-23 LAB — CBC
HCT: 32.2 % — ABNORMAL LOW (ref 36.0–46.0)
Hemoglobin: 10.5 g/dL — ABNORMAL LOW (ref 12.0–15.0)
MCH: 27.7 pg (ref 26.0–34.0)
MCHC: 32.6 g/dL (ref 30.0–36.0)
MCV: 85 fL (ref 80.0–100.0)
Platelets: 136 10*3/uL — ABNORMAL LOW (ref 150–400)
RBC: 3.79 MIL/uL — ABNORMAL LOW (ref 3.87–5.11)
RDW: 16.2 % — ABNORMAL HIGH (ref 11.5–15.5)
WBC: 7.7 10*3/uL (ref 4.0–10.5)
nRBC: 0 % (ref 0.0–0.2)

## 2021-02-23 LAB — URINALYSIS, COMPLETE (UACMP) WITH MICROSCOPIC
Bacteria, UA: NONE SEEN
Bilirubin Urine: NEGATIVE
Glucose, UA: 150 mg/dL — AB
Ketones, ur: 5 mg/dL — AB
Nitrite: NEGATIVE
Protein, ur: 30 mg/dL — AB
Specific Gravity, Urine: 1.033 — ABNORMAL HIGH (ref 1.005–1.030)
pH: 5 (ref 5.0–8.0)

## 2021-02-23 LAB — BASIC METABOLIC PANEL
Anion gap: 5 (ref 5–15)
BUN: 30 mg/dL — ABNORMAL HIGH (ref 8–23)
CO2: 22 mmol/L (ref 22–32)
Calcium: 8.5 mg/dL — ABNORMAL LOW (ref 8.9–10.3)
Chloride: 109 mmol/L (ref 98–111)
Creatinine, Ser: 1.22 mg/dL — ABNORMAL HIGH (ref 0.44–1.00)
GFR, Estimated: 49 mL/min — ABNORMAL LOW (ref >60)
Glucose, Bld: 125 mg/dL — ABNORMAL HIGH (ref 70–99)
Potassium: 4.5 mmol/L (ref 3.5–5.1)
Sodium: 136 mmol/L (ref 135–145)

## 2021-02-23 LAB — GLUCOSE, CAPILLARY
Glucose-Capillary: 123 mg/dL — ABNORMAL HIGH (ref 70–99)
Glucose-Capillary: 245 mg/dL — ABNORMAL HIGH (ref 70–99)
Glucose-Capillary: 248 mg/dL — ABNORMAL HIGH (ref 70–99)
Glucose-Capillary: 187 mg/dL — ABNORMAL HIGH (ref 70–99)

## 2021-02-23 LAB — CBG MONITORING, ED: Glucose-Capillary: 133 mg/dL — ABNORMAL HIGH (ref 70–99)

## 2021-02-23 LAB — PROCALCITONIN: Procalcitonin: 5.04 ng/mL

## 2021-02-23 LAB — PROTIME-INR
INR: 1.2 (ref 0.8–1.2)
Prothrombin Time: 14.9 seconds (ref 11.4–15.2)

## 2021-02-23 LAB — CORTISOL-AM, BLOOD: Cortisol - AM: 8.2 ug/dL (ref 6.7–22.6)

## 2021-02-23 LAB — AMMONIA: Ammonia: 9 umol/L (ref 9–35)

## 2021-02-23 LAB — TROPONIN I (HIGH SENSITIVITY): Troponin I (High Sensitivity): 6 ng/L (ref <18)

## 2021-02-23 LAB — MAGNESIUM: Magnesium: 1.8 mg/dL (ref 1.7–2.4)

## 2021-02-23 LAB — LACTIC ACID, PLASMA: Lactic Acid, Venous: 2.5 mmol/L (ref 0.5–1.9)

## 2021-02-23 MED ORDER — ACETAMINOPHEN 500 MG PO TABS
1000.0000 mg | ORAL_TABLET | Freq: Once | ORAL | Status: AC
  Administered 2021-02-23: 1000 mg via ORAL
  Filled 2021-02-23: qty 2

## 2021-02-23 MED ORDER — NITROGLYCERIN 0.4 MG SL SUBL: 0.4000 mg | SUBLINGUAL_TABLET | SUBLINGUAL | Status: DC | PRN

## 2021-02-23 MED ORDER — VITAMIN B-12 1000 MCG PO TABS
1000.0000 ug | ORAL_TABLET | Freq: Every day | ORAL | Status: DC
  Administered 2021-02-23 – 2021-02-27 (×5): 1000 ug via ORAL
  Filled 2021-02-23 (×5): qty 1

## 2021-02-23 MED ORDER — SERTRALINE HCL 50 MG PO TABS
50.0000 mg | ORAL_TABLET | Freq: Every day | ORAL | Status: DC
  Administered 2021-02-23 – 2021-02-27 (×5): 50 mg via ORAL
  Filled 2021-02-23 (×5): qty 1

## 2021-02-23 MED ORDER — MAGNESIUM HYDROXIDE 400 MG/5ML PO SUSP: 30.0000 mL | Freq: Every day | ORAL | Status: DC | PRN

## 2021-02-23 MED ORDER — LACTULOSE 10 GM/15ML PO SOLN
20.0000 g | Freq: Every day | ORAL | Status: DC
  Administered 2021-02-23 – 2021-02-27 (×5): 20 g via ORAL
  Filled 2021-02-23 (×5): qty 30

## 2021-02-23 MED ORDER — MELATONIN 5 MG PO TABS
10.0000 mg | ORAL_TABLET | Freq: Every evening | ORAL | Status: DC | PRN
  Administered 2021-02-25: 10 mg via ORAL
  Filled 2021-02-23: qty 2

## 2021-02-23 MED ORDER — LACTATED RINGERS IV BOLUS
1000.0000 mL | Freq: Once | INTRAVENOUS | Status: AC
  Administered 2021-02-23: 1000 mL via INTRAVENOUS

## 2021-02-23 MED ORDER — SODIUM CHLORIDE 0.9 % IV SOLN: INTRAVENOUS | Status: DC

## 2021-02-23 MED ORDER — ONDANSETRON HCL 4 MG/2ML IJ SOLN: 4.0000 mg | Freq: Four times a day (QID) | INTRAMUSCULAR | Status: DC | PRN

## 2021-02-23 MED ORDER — ACETAMINOPHEN 650 MG RE SUPP: 650.0000 mg | Freq: Four times a day (QID) | RECTAL | Status: DC | PRN

## 2021-02-23 MED ORDER — TRAMADOL HCL 50 MG PO TABS
50.0000 mg | ORAL_TABLET | Freq: Two times a day (BID) | ORAL | Status: DC | PRN
  Administered 2021-02-23 – 2021-02-27 (×6): 50 mg via ORAL
  Filled 2021-02-23 (×6): qty 1

## 2021-02-23 MED ORDER — ACETAMINOPHEN 325 MG PO TABS
650.0000 mg | ORAL_TABLET | Freq: Four times a day (QID) | ORAL | Status: DC | PRN
  Administered 2021-02-27: 650 mg via ORAL
  Filled 2021-02-23: qty 2

## 2021-02-23 MED ORDER — ENOXAPARIN SODIUM 40 MG/0.4ML IJ SOSY
40.0000 mg | PREFILLED_SYRINGE | INTRAMUSCULAR | Status: DC
  Administered 2021-02-23 – 2021-02-27 (×5): 40 mg via SUBCUTANEOUS
  Filled 2021-02-23 (×5): qty 0.4

## 2021-02-23 MED ORDER — POTASSIUM CHLORIDE 20 MEQ PO PACK
40.0000 meq | PACK | Freq: Once | ORAL | Status: AC
  Administered 2021-02-23: 40 meq via ORAL
  Filled 2021-02-23: qty 2

## 2021-02-23 MED ORDER — TRAZODONE HCL 50 MG PO TABS
25.0000 mg | ORAL_TABLET | Freq: Every evening | ORAL | Status: DC | PRN
  Administered 2021-02-25: 25 mg via ORAL
  Filled 2021-02-23: qty 1

## 2021-02-23 MED ORDER — MORPHINE SULFATE (PF) 2 MG/ML IV SOLN
2.0000 mg | INTRAVENOUS | Status: DC | PRN
  Administered 2021-02-23: 2 mg via INTRAVENOUS
  Filled 2021-02-23: qty 1

## 2021-02-23 MED ORDER — ONDANSETRON HCL 4 MG PO TABS: 4.0000 mg | ORAL_TABLET | Freq: Four times a day (QID) | ORAL | Status: DC | PRN

## 2021-02-23 MED ORDER — INSULIN ASPART 100 UNIT/ML IJ SOLN
0.0000 [IU] | Freq: Three times a day (TID) | INTRAMUSCULAR | Status: DC
  Administered 2021-02-23: 3 [IU] via SUBCUTANEOUS
  Administered 2021-02-23: 1 [IU] via SUBCUTANEOUS
  Administered 2021-02-23: 3 [IU] via SUBCUTANEOUS
  Administered 2021-02-23: 2 [IU] via SUBCUTANEOUS
  Administered 2021-02-24: 1 [IU] via SUBCUTANEOUS
  Administered 2021-02-24: 3 [IU] via SUBCUTANEOUS
  Administered 2021-02-24: 2 [IU] via SUBCUTANEOUS
  Administered 2021-02-24: 3 [IU] via SUBCUTANEOUS
  Administered 2021-02-25 (×2): 9 [IU] via SUBCUTANEOUS
  Filled 2021-02-23 (×10): qty 1

## 2021-02-23 MED ORDER — CARVEDILOL 3.125 MG PO TABS
3.1250 mg | ORAL_TABLET | Freq: Two times a day (BID) | ORAL | Status: DC
  Administered 2021-02-23 – 2021-02-27 (×9): 3.125 mg via ORAL
  Filled 2021-02-23 (×9): qty 1

## 2021-02-23 MED ORDER — INSULIN DETEMIR 100 UNIT/ML ~~LOC~~ SOLN
5.0000 [IU] | Freq: Two times a day (BID) | SUBCUTANEOUS | Status: DC
  Administered 2021-02-23 – 2021-02-25 (×5): 5 [IU] via SUBCUTANEOUS
  Filled 2021-02-23 (×6): qty 0.05

## 2021-02-23 MED ORDER — AMLODIPINE BESYLATE 10 MG PO TABS
10.0000 mg | ORAL_TABLET | Freq: Every day | ORAL | Status: DC
  Administered 2021-02-23 – 2021-02-27 (×5): 10 mg via ORAL
  Filled 2021-02-23 (×5): qty 1

## 2021-02-23 MED ORDER — DOCUSATE SODIUM 100 MG PO CAPS
100.0000 mg | ORAL_CAPSULE | Freq: Every day | ORAL | Status: DC
  Administered 2021-02-23 – 2021-02-27 (×5): 100 mg via ORAL
  Filled 2021-02-23 (×5): qty 1

## 2021-02-23 MED ORDER — INSULIN DETEMIR 100 UNIT/ML ~~LOC~~ SOLN
30.0000 [IU] | Freq: Two times a day (BID) | SUBCUTANEOUS | Status: DC
  Filled 2021-02-23: qty 0.3

## 2021-02-23 MED ORDER — POLYETHYLENE GLYCOL 3350 17 G PO PACK
17.0000 g | PACK | Freq: Two times a day (BID) | ORAL | Status: DC
  Administered 2021-02-23 – 2021-02-26 (×5): 17 g via ORAL
  Filled 2021-02-23 (×8): qty 1

## 2021-02-23 MED ORDER — SODIUM CHLORIDE 0.9 % IV SOLN
1.0000 g | INTRAVENOUS | Status: DC
  Administered 2021-02-23 – 2021-02-24 (×2): 1 g via INTRAVENOUS
  Filled 2021-02-23 (×2): qty 10

## 2021-02-23 MED ORDER — MORPHINE SULFATE (PF) 2 MG/ML IV SOLN
1.0000 mg | INTRAVENOUS | Status: DC | PRN
  Administered 2021-02-24 – 2021-02-26 (×5): 1 mg via INTRAVENOUS
  Filled 2021-02-23 (×5): qty 1

## 2021-02-23 NOTE — Progress Notes (Addendum)
PROGRESS NOTE    [name]  OVZ:[id number] DOB: June 06, 1956 DOA: 02/22/2021 PCP: [name], NP   Chief Complaint  Patient presents with   Hypoglycemia    Brief Narrative:   This is a no charge note as patient admitted earlier today by Dr. Sidney Ace, chart, imaging, labs were reviewed, patient was seen and examined.  Assessment & Plan:   Active Problems:   Encephalopathy    Acute metabolic cephalopathy  -Multifactorial, due to sepsis and hypoglycemia   Sepsis due to UTI, present on admission  -Present on admission, given tachypnea, tachycardia, altered mental status and elevated lactic acid  - The patient will be admitted to a medical monitored bed. - She will be placed on IV Rocephin and will follow urine and blood cultures. - We will follow lactic acid and elevated procalcitonin. - We will monitor her blood glucose measures and place her on hypoglycemia protocol. - The patient will be placed on supplement coverage with NovoLog. - We will hold off basal coverage with Levemir given hypoglycemia.  Chest pain, rule out acute coronary syndrome. - This likely atypical.  She had 2 negative sets of troponin I's.  She was chest pain-free during my interview.  Diabetes mellitus, insulin-dependent, with hyperglycemia -Now CBG started to increase will lower Levemir to 5 units twice daily (30 units twice daily at home) continue with insulin sliding scale.  Hypokalemia. - replaced    Acute kidney injury on stage IIIa chronic kidney disease. - The patient will be hydrated with IV normal saline and will follow BMP.  Essential hypertension. - We will continue Norvasc and Coreg.  Vitamin B12 deficiency. - We will continue vitamin B12.  Depression. - We will continue Zoloft.   DVT prophylaxis: Lovenox Code Status: Full Family Communication:none at bedside Disposition:   Status is: Inpatient  Remains inpatient appropriate because:IV treatments appropriate due to  intensity of illness or inability to take PO  Dispo: The patient is from: Home              Anticipated d/c is to: Home              Patient currently is not medically stable to d/c.   Difficult to place patient No       Consultants:  None   Subjective:  Reports she is feeling poorly, and hurting all over  Objective: Vitals:   02/23/21 0200 02/23/21 0301 02/23/21 0800 02/23/21 1149  BP: (!) 164/67 (!) 162/93 (!) 147/69 100/61  Pulse: 85 87 89 79  Resp: 18 20 17 16   Temp:  97.6 F (36.4 C) 98 F (36.7 C) 97.7 F (36.5 C)  TempSrc:  Oral Oral Oral  SpO2: 100% 100% 100% 98%  Weight:      Height:        Intake/Output Summary (Last 24 hours) at 02/23/2021 1245 Last data filed at 02/23/2021 1034 Gross per 24 hour  Intake 1447.83 ml  Output --  Net 1447.83 ml   Filed Weights   02/22/21 1958  Weight: 77.1 kg    Examination:   Awake Alert, confused, frail, ill-appearing, no new F.N deficits, Normal affect Symmetrical Chest wall movement, Good air movement bilaterally, CTAB RRR,No Gallops,Rubs or new Murmurs, No Parasternal Heave +ve B.Sounds, Abd Soft, No tenderness, No rebound - guarding or rigidity. No Cyanosis, Clubbing or edema, No new Rash or bruise        Data Reviewed: I have personally reviewed following labs and imaging studies  CBC:  Recent Labs  Lab 02/22/21 2001 02/23/21 0403  WBC 8.4 7.7  NEUTROABS 7.1  --   HGB 11.4* 10.5*  HCT 35.2* 32.2*  MCV 86.3 85.0  PLT 165 136*    Basic Metabolic Panel: Recent Labs  Lab 02/22/21 2001 02/23/21 0403 02/23/21 0627  NA 137 136  --   K 3.0* 4.5  --   CL 109 109  --   CO2 21* 22  --   GLUCOSE 166* 125*  --   BUN 36* 30*  --   CREATININE 1.52* 1.22*  --   CALCIUM 8.8* 8.5*  --   MG  --   --  1.8    GFR: Estimated Creatinine Clearance: 44 mL/min (A) (by C-G formula based on SCr of 1.22 mg/dL (H)).  Liver Function Tests: Recent Labs  Lab 02/22/21 2001  AST 51*  ALT 33  ALKPHOS 129*   BILITOT 1.1  PROT 7.1  ALBUMIN 3.7    CBG: Recent Labs  Lab 02/22/21 1957 02/22/21 2344 02/23/21 0221 02/23/21 0831 02/23/21 1147  GLUCAP 186* 162* 133* 123* 245*     Recent Results (from the past 240 hour(s))  Culture, blood (routine x 2)     Status: None   Collection Time: 02/15/21 11:58 AM   Specimen: BLOOD  Result Value Ref Range Status   Specimen Description BLOOD THUMB  Final   Special Requests   Final    BOTTLES DRAWN AEROBIC AND ANAEROBIC Blood Culture adequate volume   Culture   Final    NO GROWTH 5 DAYS Performed at [facility], [place] [address], [place], [place] [address]    Report Status 02/20/2021 FINAL  Final  Culture, blood (routine x 2)     Status: None   Collection Time: 02/15/21 12:11 PM   Specimen: BLOOD RIGHT HAND  Result Value Ref Range Status   Specimen Description BLOOD RIGHT HAND  Final   Special Requests   Final    BOTTLES DRAWN AEROBIC ONLY Blood Culture results may not be optimal due to an inadequate volume of blood received in culture bottles   Culture   Final    NO GROWTH 5 DAYS Performed at [facility], [place] [address], [place], [place] [address]    Report Status 02/20/2021 FINAL  Final  Resp Panel by RT-PCR (Flu A&B, Covid) Nasopharyngeal Swab     Status: None   Collection Time: 02/22/21  8:01 PM   Specimen: Nasopharyngeal Swab; Nasopharyngeal(NP) swabs in vial transport medium  Result Value Ref Range Status   SARS Coronavirus 2 by RT PCR NEGATIVE NEGATIVE Final    Comment: (NOTE) SARS-CoV-2 target nucleic acids are NOT DETECTED.  The SARS-CoV-2 RNA is generally detectable in upper respiratory specimens during the acute phase of infection. The lowest concentration of SARS-CoV-2 viral copies this assay can detect is 138 copies/mL. A negative result does not preclude SARS-Cov-2 infection and should not be used as the sole basis for treatment or other patient management decisions. A negative result may occur with   improper specimen collection/handling, submission of specimen other than nasopharyngeal swab, presence of viral mutation(s) within the areas targeted by this assay, and inadequate number of viral copies(<138 copies/mL). A negative result must be combined with clinical observations, patient history, and epidemiological information. The expected result is Negative.  Fact Sheet for Patients:  EntrepreneurPulse.com.au  Fact Sheet for Healthcare Providers:  IncredibleEmployment.be  This test is no t yet approved or cleared by the [place]  States FDA and  has been authorized for detection and/or diagnosis of SARS-CoV-2 by FDA under an Emergency Use Authorization (EUA). This EUA will remain  in effect (meaning this test can be used) for the duration of the COVID-19 declaration under Section 564(b)(1) of the Act, 21 U.S.C.section 360bbb-3(b)(1), unless the authorization is terminated  or revoked sooner.       Influenza A by PCR NEGATIVE NEGATIVE Final   Influenza B by PCR NEGATIVE NEGATIVE Final    Comment: (NOTE) The Xpert Xpress SARS-CoV-2/FLU/RSV plus assay is intended as an aid in the diagnosis of influenza from Nasopharyngeal swab specimens and should not be used as a sole basis for treatment. Nasal washings and aspirates are unacceptable for Xpert Xpress SARS-CoV-2/FLU/RSV testing.  Fact Sheet for Patients: EntrepreneurPulse.com.au  Fact Sheet for Healthcare Providers: IncredibleEmployment.be  This test is not yet approved or cleared by the [place] FDA and has been authorized for detection and/or diagnosis of SARS-CoV-2 by FDA under an Emergency Use Authorization (EUA). This EUA will remain in effect (meaning this test can be used) for the duration of the COVID-19 declaration under Section 564(b)(1) of the Act, 21 U.S.C. section 360bbb-3(b)(1), unless the authorization is terminated  or revoked.  Performed at [facility], [address], [place], [place] [address]          Radiology Studies: DG Chest 2 View  Result Date: 02/22/2021 CLINICAL DATA:  Chest pain.  Hypoglycemia.  Unresponsive. EXAM: CHEST - 2 VIEW COMPARISON:  02/12/2021 FINDINGS: Heart size and pulmonary vascularity are normal. Lungs are clear. Calcification of the aorta. Degenerative changes in the spine and shoulders. No pleural effusions. No pneumothorax. Mediastinal contours appear intact. IMPRESSION: No active cardiopulmonary disease. Electronically Signed   By: Lucienne Capers M.D.   On: 02/22/2021 20:32   CT Head Wo Contrast  Result Date: 02/22/2021 CLINICAL DATA:  Hypoglycemia altered mental status EXAM: CT HEAD WITHOUT CONTRAST TECHNIQUE: Contiguous axial images were obtained from the base of the skull through the vertex without intravenous contrast. COMPARISON:  MRI 02/12/2021, CT brain 02/11/2021 FINDINGS: Brain: No acute territorial infarction, hemorrhage, or intracranial mass. Atrophy and chronic small vessel ischemic changes of the white matter. Stable ventricle size. Vascular: No hyperdense vessels.  Carotid vascular calcification Skull: Normal. Negative for fracture or focal lesion. Sinuses/Orbits: Mucosal thickening in the sphenoid and ethmoid sinuses. Fluid in the sphenoid sinus. Left greater than right mastoid effusion Other: None IMPRESSION: 1. No CT evidence for acute intracranial abnormality. Atrophy and chronic small vessel ischemic changes of the white matter 2. Sinusitis.  Bilateral mastoid effusions. Electronically Signed   By: Donavan Foil M.D.   On: 02/22/2021 20:39   CT Angio Chest PE W and/or Wo Contrast  Result Date: 02/22/2021 CLINICAL DATA:  Positive D-dimer, chest pain, syncope EXAM: CT ANGIOGRAPHY CHEST WITH CONTRAST TECHNIQUE: Multidetector CT imaging of the chest was performed using the standard protocol during bolus administration of intravenous contrast.  Multiplanar CT image reconstructions and MIPs were obtained to evaluate the vascular anatomy. CONTRAST:  28m OMNIPAQUE IOHEXOL 350 MG/ML SOLN COMPARISON:  Radiograph 02/22/2021 FINDINGS: Cardiovascular: Satisfactory opacification the pulmonary arteries to the segmental level. No pulmonary artery filling defects are identified. Central pulmonary arteries are normal caliber. Cardiomegaly. Coronary artery atherosclerosis. Trace pericardial fluid, stable from prior. Slight dilatation of the ascending thoracic aorta to 4 cm. No acute luminal abnormality of the imaged aorta. No periaortic stranding or hemorrhage. Minimal plaque within the aorta and tortuous the normally branching proximal great  vessels. No major venous abnormalities or significant venous reflux. Mediastinum/Nodes: No mediastinal fluid or gas. Normal thyroid gland and thoracic inlet. No acute abnormality of the trachea. Circumferential thickening of the distal thoracic esophagus without free air or fluid. No worrisome mediastinal, hilar or axillary adenopathy. Lungs/Pleura: Atelectatic changes likely accentuated by imaging during exhalation. No focal consolidative opacity. Mild peribronchial thickening. No pneumothorax. No effusion. No concerning pulmonary nodules or masses. Upper Abdomen: Cirrhotic changes of the liver including a nodular hepatic surface contour with caudate left lobe hypertrophy. Splenomegaly similar to prior. Upper abdominal venous collateralization. Mild symmetric bilateral perinephric stranding, a nonspecific finding which may correlate with advanced age or decreased renal function. No acute abnormalities present in the visualized portions of the upper abdomen. Musculoskeletal: No acute osseous abnormality or suspicious osseous lesion. Review of the MIP images confirms the above findings. IMPRESSION: 1. No evidence of pulmonary embolism. 2. Atelectatic changes likely accentuated by imaging during exhalation. 3. Mild peribronchial  thickening, could reflect airways inflammation early interstitial edema. 4. Cirrhotic morphology of the liver with nodular surface contour, hepatosplenomegaly and upper abdominal collateralization. 5. Circumferential thickening of the distal thoracic esophagus, could correlate for features of esophagitis. No CT evidence of perforation. 6. Cardiomegaly.  Coronary artery atherosclerosis. 7.  Aortic Atherosclerosis (ICD10-I70.0). 8. Ascending thoracic aortic dilatation to 4 cm. Recommend annual imaging followup by CTA or MRA. This recommendation follows 2010 ACCF/AHA/AATS/ACR/ASA/SCA/SCAI/SIR/STS/SVM Guidelines for the Diagnosis and Management of Patients with Thoracic Aortic Disease. Circulation. 2010; 121: K462-M638. Aortic aneurysm NOS (ICD10-I71.9). Electronically Signed   By: Lovena Le M.D.   On: 02/22/2021 23:37        Scheduled Meds:  amLODipine  10 mg Oral Daily   carvedilol  3.125 mg Oral BID WC   docusate sodium  100 mg Oral Daily   enoxaparin (LOVENOX) injection  40 mg Subcutaneous Q24H   insulin aspart  0-9 Units Subcutaneous TID AC & HS   lactulose  20 g Oral Daily   polyethylene glycol  17 g Oral BID   sertraline  50 mg Oral Daily   vitamin B-12  1,000 mcg Oral Daily   Continuous Infusions:  cefTRIAXone (ROCEPHIN)  IV 200 mL/hr at 02/23/21 0625     LOS: 0 days      Phillips Climes, MD [facility]   To contact the attending provider between 7A-7P or the covering provider during after hours 7P-7A, please log into the web site [email or web address] and access using universal [place] password for that web site. If you do not have the password, please call the hospital operator.  02/23/2021, 12:45 PM

## 2021-02-23 NOTE — Progress Notes (Signed)
Went In to see the patient and to discuss DC plan and needs, She is unable to answer questions due to confusion, I attempted to reach her husband and left a general voice mail requesting a call back, Per the chart the patient was set up with Home health a week ago and has the [place], I called Kristen at [employer] and left a secure VM letting her know the patient is in the hospital and will get Resumption of care orders, the patient has a RW and 3 in 1 as well as ram to the house at home, no DME needed at this time, will continue to Monitor for additional needs

## 2021-02-23 NOTE — H&P (Addendum)
[place]   PATIENT NAME: [name]    MR#:  [id number]  DATE OF BIRTH:  1955-09-23  DATE OF ADMISSION:  02/22/2021  PRIMARY CARE PHYSICIAN: [name], NP   Patient is coming from: Home  REQUESTING/REFERRING PHYSICIAN: Rudene Re, MD  CHIEF COMPLAINT:   Chief Complaint  Patient presents with  . Hypoglycemia    HISTORY OF PRESENT ILLNESS:  [name] is a 65 y.o. Caucasian female with medical history significant for multiple medical problems that are mentioned below, presented to the emergency room with acute onset of altered mental status with confusion and and unresponsiveness where she was found to be hypoglycemic with blood glucose of 38.  She apparently slid from her bed and hit her head.  She denied any paresthesias or focal muscle weakness.  No nausea or vomiting or abdominal pain.  She was noted to have muscle twitching and tremors.  No chest pain or palpitations.  She admitted to tactile fever without chills.  She has been having urinary frequency and urgency without significant dysuria or hematuria or flank pain.  She denies any dyspnea or cough or wheezing.  ED Course: When she came to the ER heart rate was 104 with respiratory rate of 26 and blood pressure was 148/71.  Labs revealed a BUN of 30 with creatinine 1.22 and CBC showing anemia.  UA was positive for UTI. EKG as reviewed by me : EKG showed sinus rhythm with a rate of 98, Q waves anteroseptally and inferiorly.  It showed prolonged QT interval with QTC of 510 MS. Imaging: 2 view chest x-ray showed no acute cardiopulmonary disease.  Chest CTA revealed the following: 1. No evidence of pulmonary embolism. 2. Atelectatic changes likely accentuated by imaging during exhalation. 3. Mild peribronchial thickening, could reflect airways inflammation early interstitial edema. 4. Cirrhotic morphology of the liver with nodular surface contour, hepatosplenomegaly and upper abdominal  collateralization. 5. Circumferential thickening of the distal thoracic esophagus, could correlate for features of esophagitis. No CT evidence of perforation. 6. Cardiomegaly.  Coronary artery atherosclerosis. 7.  Aortic Atherosclerosis (ICD10-I70.0). 8. Ascending thoracic aortic dilatation to 4 cm. Recommend annual imaging followup by CTA or MRA.  The patient was given 1 g of p.o. Tylenol, 2 L bolus of IV lactated ringer and 4 mg of IV morphine sulfate and was ordered IV Rocephin.  She was more in the hyper glycemic side here.  She will be admitted to a medical monitored bed for further evaluation and management.  PAST MEDICAL HISTORY:   Past Medical History:  Diagnosis Date  . Acute encephalopathy 05/22/2018  . Allergy   . Anginal pain ([place])   . Anxiety   . Ascites   . C. difficile colitis 07/10/2015  . Cancer (HCC)    HX OF CANCER OF UTERUS   . Chronic kidney disease    States she only has 1 working kidney  . Cirrhosis of liver not due to alcohol ([place]) 2016  . Degenerative disk disease   . Diverticulitis   . Gastroparesis   . GERD (gastroesophageal reflux disease)   . History of hiatal hernia   . Hypertension   . Hypothyroid   . Hypothyroidism due to amiodarone   . Ileus ([place]) 08/01/2015  . Intussusception intestine ([place]) 05/2015  . Orthostatic hypotension   . PAF (paroxysmal atrial fibrillation) ([place]) 03/2015   a. new onset 03/2015 in setting of intractable N/V; b. on Eliquis 5 mg bid; c. CHADSVASc 4 (  DM, TIA x 2, female)  . Pancreatitis   . Pneumonia 11/14/2015  . Pneumonia 07/2020  . Right ureteral stone 07/14/2016  . Sepsis ([place]) 12/15/2017  . Sick sinus syndrome ([place])   . Stomach ulcer   . Stroke [facility])    with minimal left sided weakness  . Syncope 01/2015  . Syncope due to orthostatic hypotension 05/18/2015  . Tachyarrhythmia 01/10/2016  . TIA (transient ischemic attack) 02/2015  . Type 1 diabetes ([place])    on levemir  . UTERINE CANCER, HX OF 03/27/2007   Qualifier:  Diagnosis of  By: Maxie Better FNP, Rosalita Levan   . UTI (urinary tract infection) 05/22/2018    PAST SURGICAL HISTORY:   Past Surgical History:  Procedure Laterality Date  . ABDOMINAL HYSTERECTOMY    . CARDIAC CATHETERIZATION N/A 01/12/2016   Procedure: Left Heart Cath and Coronary Angiography;  Surgeon: Wellington Hampshire, MD;  Location: [facility];  Service: Cardiovascular;  Laterality: N/A;  . CHOLECYSTECTOMY    . CYSTOSCOPY W/ RETROGRADES Bilateral 12/11/2019   Procedure: CYSTOSCOPY WITH RETROGRADE PYELOGRAM;  Surgeon: Billey Co, MD;  Location: [facility] ORS;  Service: Urology;  Laterality: Bilateral;  . CYSTOSCOPY WITH BIOPSY N/A 12/11/2019   Procedure: CYSTOSCOPY WITH BLADDER BIOPSY;  Surgeon: Billey Co, MD;  Location: [facility];  Service: Urology;  Laterality: N/A;  . CYSTOSCOPY WITH FULGERATION N/A 12/11/2019   Procedure: CYSTOSCOPY WITH FULGERATION;  Surgeon: Billey Co, MD;  Location: [facility];  Service: Urology;  Laterality: N/A;  . CYSTOSCOPY WITH STENT PLACEMENT Right 12/11/2019   Procedure: CYSTOSCOPY WITH STENT PLACEMENT;  Surgeon: Billey Co, MD;  Location: [facility];  Service: Urology;  Laterality: Right;  . CYSTOSCOPY WITH URETHRAL DILATATION Right 12/11/2019   Procedure: CYSTOSCOPY WITH URETERAL DILATATION;  Surgeon: Billey Co, MD;  Location: [facility];  Service: Urology;  Laterality: Right;  . CYSTOSCOPY/URETEROSCOPY/HOLMIUM LASER Right 07/14/2016   Procedure: CYSTOSCOPY/URETEROSCOPY/HOLMIUM LASER;  Surgeon: Alexis Frock, MD;  Location: [facility];  Service: Urology;  Laterality: Right;  . ESOPHAGOGASTRODUODENOSCOPY N/A 04/04/2015   Procedure: ESOPHAGOGASTRODUODENOSCOPY (EGD);  Surgeon: Hulen Luster, MD;  Location: [facility] ENDOSCOPY;  Service: Endoscopy;  Laterality: N/A;  . ESOPHAGOGASTRODUODENOSCOPY N/A 12/28/2017   Procedure: ESOPHAGOGASTRODUODENOSCOPY (EGD);  Surgeon: Lin Landsman, MD;  Location: [facility] ENDOSCOPY;  Service: Gastroenterology;   Laterality: N/A;  . ESOPHAGOGASTRODUODENOSCOPY (EGD) WITH PROPOFOL N/A 01/18/2016   Procedure: ESOPHAGOGASTRODUODENOSCOPY (EGD) WITH PROPOFOL;  Surgeon: Lucilla Lame, MD;  Location: [facility] ENDOSCOPY;  Service: Endoscopy;  Laterality: N/A;  . FLEXIBLE SIGMOIDOSCOPY N/A 01/18/2016   Procedure: FLEXIBLE SIGMOIDOSCOPY;  Surgeon: Lucilla Lame, MD;  Location: [facility] ENDOSCOPY;  Service: Endoscopy;  Laterality: N/A;  . HERNIA REPAIR    . URETEROSCOPY Right 12/11/2019   Procedure: Christen Butter;  Surgeon: Billey Co, MD;  Location: [facility]S;  Service: Urology;  Laterality: Right;    SOCIAL HISTORY:   Social History   Tobacco Use  . Smoking status: Former    Pack years: 0.00    Types: Cigarettes  . Smokeless tobacco: Never  . Tobacco comments:    25 years ago and only smoked occasionally  Substance Use Topics  . Alcohol use: No    FAMILY HISTORY:   Family History  Problem Relation Age of Onset  . Hypertension Mother   . CAD Sister   . Heart attack Sister        Deceased 2014-11-27  . CAD Brother     DRUG ALLERGIES:   Allergies  Allergen  Reactions  . Latex Other (See Comments)    Pt had allergic reaction to tegaderm dressing on IV.  Caused blisters on the skin.  . Aspirin Rash  . Codeine Sulfate Rash  . Erythromycin Rash and Other (See Comments)    Fever, also  . Prednisone Swelling  . Rosiglitazone Maleate Swelling  . Tetanus-Diphtheria Toxoids Td Rash and Other (See Comments)    Fever, also    REVIEW OF SYSTEMS:   ROS As per history of present illness. All pertinent systems were reviewed above. Constitutional, HEENT, cardiovascular, respiratory, GI, GU, musculoskeletal, neuro, psychiatric, endocrine, integumentary and hematologic systems were reviewed and are otherwise negative/unremarkable except for positive findings mentioned above in the HPI.   MEDICATIONS AT HOME:   Prior to Admission medications   Medication Sig Start Date End Date Taking? Authorizing  Provider  amLODipine (NORVASC) 10 MG tablet Take 1 tablet (10 mg total) by mouth daily. 02/16/21   Thurnell Lose, MD  carvedilol (COREG) 3.125 MG tablet Take 1 tablet (3.125 mg total) by mouth 2 (two) times daily with a meal. 02/15/21   Thurnell Lose, MD  docusate sodium (COLACE) 100 MG capsule Take 1 capsule (100 mg total) by mouth daily. 02/15/21 03/17/21  Thurnell Lose, MD  doxycycline (VIBRA-TABS) 100 MG tablet Take 1 tablet (100 mg total) by mouth every 12 (twelve) hours. 02/15/21   Thurnell Lose, MD  insulin detemir (LEVEMIR FLEXTOUCH) 100 UNIT/ML FlexPen Inject 30 Units into the skin 2 (two) times daily. 02/10/21   [name], NP  insulin lispro (HUMALOG KWIKPEN) 100 UNIT/ML KwikPen Before each meal 3 times a day, 140-199 - 2 units, 200-250 - 4 units, 251-299 - 6 units,  300-349 - 8 units,  350 or above 10 units. Insulin PEN if approved, provide syringes and needles if needed. 02/15/21   Thurnell Lose, MD  Insulin Syringe-Needle U-100 25G X 1" 1 ML MISC For 4 times a day insulin SQ, 1 month supply. Diagnosis E11.65 02/15/21   Thurnell Lose, MD  lactulose (CHRONULAC) 10 GM/15ML solution Take 30 mLs (20 g total) by mouth daily. Skip if you have diarrhea. 02/16/21   Thurnell Lose, MD  Melatonin 10 MG CAPS Take 1 tablet by mouth at bedtime as needed (sleep).    [provider]  polyethylene glycol (MIRALAX / GLYCOLAX) 17 g packet Take 17 g by mouth 2 (two) times daily. 02/15/21   Thurnell Lose, MD  sertraline (ZOLOFT) 50 MG tablet Take 1 tablet (50 mg total) by mouth daily. For anxiety and depression. 10/27/20   [name], NP  vitamin B-12 (CYANOCOBALAMIN) 1000 MCG tablet Take 1 tablet (1,000 mcg total) by mouth daily. 02/16/21   Thurnell Lose, MD      VITAL SIGNS:  Blood pressure (!) 162/93, pulse 87, temperature 97.6 F (36.4 C), temperature source Oral, resp. rate 20, height 5' 3"  (1.6 m), weight 77.1 kg, SpO2 100 %.  PHYSICAL EXAMINATION:  Physical  Exam  GENERAL:  65 y.o.-year-old Caucasian female patient lying in the bed with no acute distress.  EYES: Pupils equal, round, reactive to light and accommodation. No scleral icterus. Extraocular muscles intact.  HEENT: Head atraumatic, normocephalic. Oropharynx and nasopharynx clear.  NECK:  Supple, no jugular venous distention. No thyroid enlargement, no tenderness.  LUNGS: Normal breath sounds bilaterally, no wheezing, rales,rhonchi or crepitation. No use of accessory muscles of respiration.  CARDIOVASCULAR: Regular rate and rhythm, S1, S2 normal. No murmurs, rubs,  or gallops.  ABDOMEN: Soft, nondistended, nontender. Bowel sounds present. No organomegaly or mass.  EXTREMITIES: No pedal edema, cyanosis, or clubbing.  NEUROLOGIC: Cranial nerves II through XII are intact. Muscle strength 5/5 in all extremities. Sensation intact. Gait not checked.  PSYCHIATRIC: The patient is alert and oriented x 3.  Normal affect and good eye contact. SKIN: No obvious rash, lesion, or ulcer.   LABORATORY PANEL:   CBC Recent Labs  Lab 02/23/21 0403  WBC 7.7  HGB 10.5*  HCT 32.2*  PLT 136*   ------------------------------------------------------------------------------------------------------------------  Chemistries  Recent Labs  Lab 02/22/21 2001 02/23/21 0403  NA 137 136  K 3.0* 4.5  CL 109 109  CO2 21* 22  GLUCOSE 166* 125*  BUN 36* 30*  CREATININE 1.52* 1.22*  CALCIUM 8.8* 8.5*  AST 51*  --   ALT 33  --   ALKPHOS 129*  --   BILITOT 1.1  --    ------------------------------------------------------------------------------------------------------------------  Cardiac Enzymes No results for input(s): TROPONINI in the last 168 hours. ------------------------------------------------------------------------------------------------------------------  RADIOLOGY:  DG Chest 2 View  Result Date: 02/22/2021 CLINICAL DATA:  Chest pain.  Hypoglycemia.  Unresponsive. EXAM: CHEST - 2 VIEW  COMPARISON:  02/12/2021 FINDINGS: Heart size and pulmonary vascularity are normal. Lungs are clear. Calcification of the aorta. Degenerative changes in the spine and shoulders. No pleural effusions. No pneumothorax. Mediastinal contours appear intact. IMPRESSION: No active cardiopulmonary disease. Electronically Signed   By: Lucienne Capers M.D.   On: 02/22/2021 20:32   CT Head Wo Contrast  Result Date: 02/22/2021 CLINICAL DATA:  Hypoglycemia altered mental status EXAM: CT HEAD WITHOUT CONTRAST TECHNIQUE: Contiguous axial images were obtained from the base of the skull through the vertex without intravenous contrast. COMPARISON:  MRI 02/12/2021, CT brain 02/11/2021 FINDINGS: Brain: No acute territorial infarction, hemorrhage, or intracranial mass. Atrophy and chronic small vessel ischemic changes of the white matter. Stable ventricle size. Vascular: No hyperdense vessels.  Carotid vascular calcification Skull: Normal. Negative for fracture or focal lesion. Sinuses/Orbits: Mucosal thickening in the sphenoid and ethmoid sinuses. Fluid in the sphenoid sinus. Left greater than right mastoid effusion Other: None IMPRESSION: 1. No CT evidence for acute intracranial abnormality. Atrophy and chronic small vessel ischemic changes of the white matter 2. Sinusitis.  Bilateral mastoid effusions. Electronically Signed   By: Donavan Foil M.D.   On: 02/22/2021 20:39   CT Angio Chest PE W and/or Wo Contrast  Result Date: 02/22/2021 CLINICAL DATA:  Positive D-dimer, chest pain, syncope EXAM: CT ANGIOGRAPHY CHEST WITH CONTRAST TECHNIQUE: Multidetector CT imaging of the chest was performed using the standard protocol during bolus administration of intravenous contrast. Multiplanar CT image reconstructions and MIPs were obtained to evaluate the vascular anatomy. CONTRAST:  74m OMNIPAQUE IOHEXOL 350 MG/ML SOLN COMPARISON:  Radiograph 02/22/2021 FINDINGS: Cardiovascular: Satisfactory opacification the pulmonary arteries to the  segmental level. No pulmonary artery filling defects are identified. Central pulmonary arteries are normal caliber. Cardiomegaly. Coronary artery atherosclerosis. Trace pericardial fluid, stable from prior. Slight dilatation of the ascending thoracic aorta to 4 cm. No acute luminal abnormality of the imaged aorta. No periaortic stranding or hemorrhage. Minimal plaque within the aorta and tortuous the normally branching proximal great vessels. No major venous abnormalities or significant venous reflux. Mediastinum/Nodes: No mediastinal fluid or gas. Normal thyroid gland and thoracic inlet. No acute abnormality of the trachea. Circumferential thickening of the distal thoracic esophagus without free air or fluid. No worrisome mediastinal, hilar or axillary adenopathy. Lungs/Pleura: Atelectatic changes likely accentuated by  imaging during exhalation. No focal consolidative opacity. Mild peribronchial thickening. No pneumothorax. No effusion. No concerning pulmonary nodules or masses. Upper Abdomen: Cirrhotic changes of the liver including a nodular hepatic surface contour with caudate left lobe hypertrophy. Splenomegaly similar to prior. Upper abdominal venous collateralization. Mild symmetric bilateral perinephric stranding, a nonspecific finding which may correlate with advanced age or decreased renal function. No acute abnormalities present in the visualized portions of the upper abdomen. Musculoskeletal: No acute osseous abnormality or suspicious osseous lesion. Review of the MIP images confirms the above findings. IMPRESSION: 1. No evidence of pulmonary embolism. 2. Atelectatic changes likely accentuated by imaging during exhalation. 3. Mild peribronchial thickening, could reflect airways inflammation early interstitial edema. 4. Cirrhotic morphology of the liver with nodular surface contour, hepatosplenomegaly and upper abdominal collateralization. 5. Circumferential thickening of the distal thoracic esophagus,  could correlate for features of esophagitis. No CT evidence of perforation. 6. Cardiomegaly.  Coronary artery atherosclerosis. 7.  Aortic Atherosclerosis (ICD10-I70.0). 8. Ascending thoracic aortic dilatation to 4 cm. Recommend annual imaging followup by CTA or MRA. This recommendation follows 2010 ACCF/AHA/AATS/ACR/ASA/SCA/SCAI/SIR/STS/SVM Guidelines for the Diagnosis and Management of Patients with Thoracic Aortic Disease. Circulation. 2010; 121: T614-E315. Aortic aneurysm NOS (ICD10-I71.9). Electronically Signed   By: Lovena Le M.D.   On: 02/22/2021 23:37      IMPRESSION AND PLAN:  Active Problems:   Encephalopathy  1.  Acute metabolic cephalopathy likely multifactorial secondary to hypoglycemia with type 2 diabetes mellitus and UTI with subsequent mild sepsis as manifested by tachypnea of 22 and mild tachycardia with a heart rate of 91.. - The patient will be admitted to a medical monitored bed. - She will be placed on IV Rocephin and will follow urine and blood cultures. - We will follow lactic acid and elevated procalcitonin. - We will monitor her blood glucose measures and place her on hypoglycemia protocol. - The patient will be placed on supplement coverage with NovoLog. - We will hold off basal coverage with Levemir given hypoglycemia.  2.  Chest pain, rule out acute coronary syndrome. - This likely atypical.  She had 2 negative sets of troponin I's.  She was chest pain-free during my interview.  3.  Hypokalemia. - Potassium will be replaced and magnesium level will be checked.  4.  Acute kidney injury on stage IIIa chronic kidney disease. - The patient will be hydrated with IV normal saline and will follow BMP.  5.  Essential hypertension. - We will continue Norvasc and Coreg.  6.  Vitamin B12 deficiency. - We will continue vitamin B12.  7.  Depression. - We will continue Zoloft.  DVT prophylaxis: Lovenox. Code Status: full code. Family Communication:  The plan of  care was discussed in details with the patient (and family). I answered all questions. The patient agreed to proceed with the above mentioned plan. Further management will depend upon hospital course. Disposition Plan: Back to previous home environment Consults called: Neurology consult.  All the records are reviewed and case discussed with ED provider.  Status is: Inpatient  Remains inpatient appropriate because:Altered mental status, Ongoing diagnostic testing needed not appropriate for outpatient work up, Unsafe d/c plan, IV treatments appropriate due to intensity of illness or inability to take PO, and Inpatient level of care appropriate due to severity of illness  Dispo: The patient is from: Home              Anticipated d/c is to: Home  Patient currently is not medically stable to d/c.   Difficult to place patient No   TOTAL TIME TAKING CARE OF THIS PATIENT: 55 minutes.    Christel Mormon M.D on 02/23/2021 at 5:10 AM  [facility]   From 7 PM-7 AM, contact night-coverage [email or web address]  CC: Primary care physician; [name], NP

## 2021-02-23 NOTE — Progress Notes (Signed)
Date and time results received: 02/23/21  0817  Test: Lactic Acid  Critical Value: 2.5  Name of Provider Notified: Elgergawy  Orders Received or Actions Taken: no new orders

## 2021-02-23 NOTE — Plan of Care (Signed)
New care plan initiated 

## 2021-02-23 NOTE — Evaluation (Signed)
Physical Therapy Evaluation Patient Details Name: [name] MRN: [id number] DOB: July 23, 1956 Today's Date: 02/23/2021   History of Present Illness  [name] is a 65 y.o. female here after fall out of bed, admitted with encephalopathy, She was recently hospitalized with similar, AMS. CT was unremarkable she was seen by neurology and thought to have severe metabolic/toxic encephalopathy. with medical history significant for CKD, DMT2, hypothyroidism, Cirrhosis, TIA, Paroxysmal A-fib not on anticoagulation.  Clinical Impression  Pt with significant confusion and inability to recall much personal or home situation, etc.  She endorses general pain but nothing that is particularly limiting.  She was only able to tolerate ~12 ft of ambulation before fatigue set in and she needed to sit.  HR and O2 were stable but she felt too fatigued.  Per progress and improved mental status pt can likely go home, reports neighbor across the street can and will be able to assist when she first goes home with HHPT.    Follow Up Recommendations Home health PT;Supervision/Assistance - 24 hour    Equipment Recommendations  None recommended by PT    Recommendations for Other Services       Precautions / Restrictions Precautions Precautions: Fall Restrictions Weight Bearing Restrictions: No      Mobility  Bed Mobility Overal bed mobility: Needs Assistance Bed Mobility: Supine to Sit Rolling: Min guard         General bed mobility comments: heavy use of UEs/rails, but able to rise to sitting EOB    Transfers Overall transfer level: Needs assistance Equipment used: Rolling walker (2 wheeled) Transfers: Sit to/from Stand Sit to Stand: Min guard         General transfer comment: similar to previous PT sessions she had some minimal initial dizziness but quickly disipated and able to maintain  Ambulation/Gait Ambulation/Gait assistance: Min guard Gait Distance (Feet): 12 Feet Assistive  device: Rolling walker (2 wheeled)       General Gait Details: Pt with slow and higly walker reliant effort, she did not have any dizziness but was quick to fatigue and requested to sit.  Stairs            Wheelchair Mobility    Modified Rankin (Stroke Patients Only)       Balance Overall balance assessment: Needs assistance Sitting-balance support: Single extremity supported Sitting balance-Leahy Scale: Good     Standing balance support: Bilateral upper extremity supported Standing balance-Leahy Scale: Fair Standing balance comment: unsteady in standing, lacked confidence but no LOBs during minimal ambulation                             Pertinent Vitals/Pain Pain Assessment: 0-10 Faces Pain Scale: Hurts little more Pain Location: low back (chronic), also c/o R LE and stomach pain    Home Living Family/patient expects to be discharged to:: Private residence Living Arrangements: Spouse/significant other Available Help at Discharge: Family;Available 24 hours/day (per previuos notes) Type of Home: Mobile home Home Access: Ramped entrance     Home Layout: One level Home Equipment: Orange - 2 wheels;Bedside commode;Shower seat;Wheelchair - manual;Grab bars - tub/shower      Prior Function Level of Independence: Independent with assistive device(s)   Gait / Transfers Assistance Needed: Uses RW           Hand Dominance   Dominant Hand: Right    Extremity/Trunk Assessment   Upper Extremity Assessment Upper Extremity Assessment: Generalized weakness  Lower Extremity Assessment Lower Extremity Assessment: Generalized weakness       Communication   Communication: No difficulties  Cognition Arousal/Alertness: Awake/alert   Overall Cognitive Status: Difficult to assess                                 General Comments: Pt with poor awareness, could not state last name or birthday despite asking multiple times t/o the session  in various settings      General Comments General comments (skin integrity, edema, etc.): Pt pleasant but confused t/o the session, struggled to stay on task    Exercises     Assessment/Plan    PT Assessment Patient needs continued PT services  PT Problem List Decreased strength;Decreased activity tolerance;Decreased balance;Decreased mobility;Decreased cognition;Decreased knowledge of use of DME;Decreased safety awareness       PT Treatment Interventions DME instruction;Gait training;Stair training;Functional mobility training;Therapeutic activities;Therapeutic exercise;Balance training;Cognitive remediation;Patient/family education    PT Goals (Current goals can be found in the Care Plan section)  Acute Rehab PT Goals Patient Stated Goal: Hopes to be home soon PT Goal Formulation: With patient Time For Goal Achievement: 03/09/21 Potential to Achieve Goals: Good    Frequency Min 2X/week   Barriers to discharge        Co-evaluation               AM-PAC PT "6 Clicks" Mobility  Outcome Measure Help needed turning from your back to your side while in a flat bed without using bedrails?: A Little Help needed moving from lying on your back to sitting on the side of a flat bed without using bedrails?: A Little Help needed moving to and from a bed to a chair (including a wheelchair)?: A Lot Help needed standing up from a chair using your arms (e.g., wheelchair or bedside chair)?: A Little Help needed to walk in hospital room?: A Lot Help needed climbing 3-5 steps with a railing? : A Lot 6 Click Score: 15    End of Session Equipment Utilized During Treatment: Gait belt Activity Tolerance: Patient tolerated treatment well Patient left: with call bell/phone within reach;in chair;with chair alarm set Nurse Communication: Mobility status PT Visit Diagnosis: Unsteadiness on feet (R26.81);History of falling (Z91.81)    Time: 5027-7412 PT Time Calculation (min) (ACUTE ONLY):  25 min   Charges:   PT Evaluation $PT Eval Low Complexity: 1 Low PT Treatments $Gait Training: 8-22 mins        Kreg Shropshire, DPT 02/23/2021, 11:16 AM

## 2021-02-23 NOTE — ED Notes (Signed)
Purewick placed and connected to suction. Brief placed.

## 2021-02-23 NOTE — ED Provider Notes (Signed)
Patient reassessed after signout.  Patient complaining of feeling ill for the last 2 days with generalized weakness, body aches and chills.  Recent admission for pneumonia and UTI.  Patient is confused and has a history of cirrhosis.  We will check an ammonia level.  CBG stable. Covid neg. CT with several incidental findings including a sending thoracic aortic dilation with no signs of dissection.  Circumferential thickening of the distal thoracic esophagus consistent with esophagitis but no perforation.  Mild peribronchial thickening consistent with edema or inflammation.  UA dirty looking. Will send culture. Patient reports not taking any insulin since yesterday night. Found hypoglycemic. Syncope. Down for unknown amount of time.    _________________________ 1:40 AM on 02/23/2021 ----------------------------------------- CK negative. Ammonia normal. Will discuss with hospitalist for admission.   Rudene Re, MD 02/23/21 [phone]

## 2021-02-24 ENCOUNTER — Inpatient Hospital Stay: Payer: Medicare Other

## 2021-02-24 DIAGNOSIS — N390 Urinary tract infection, site not specified: Secondary | ICD-10-CM | POA: Diagnosis not present

## 2021-02-24 DIAGNOSIS — E162 Hypoglycemia, unspecified: Secondary | ICD-10-CM | POA: Diagnosis not present

## 2021-02-24 DIAGNOSIS — G9340 Encephalopathy, unspecified: Secondary | ICD-10-CM | POA: Diagnosis not present

## 2021-02-24 LAB — BASIC METABOLIC PANEL
Anion gap: 4 — ABNORMAL LOW (ref 5–15)
BUN: 27 mg/dL — ABNORMAL HIGH (ref 8–23)
CO2: 20 mmol/L — ABNORMAL LOW (ref 22–32)
Calcium: 8.2 mg/dL — ABNORMAL LOW (ref 8.9–10.3)
Chloride: 113 mmol/L — ABNORMAL HIGH (ref 98–111)
Creatinine, Ser: 1.23 mg/dL — ABNORMAL HIGH (ref 0.44–1.00)
GFR, Estimated: 48 mL/min — ABNORMAL LOW (ref >60)
Glucose, Bld: 72 mg/dL (ref 70–99)
Potassium: 4 mmol/L (ref 3.5–5.1)
Sodium: 137 mmol/L (ref 135–145)

## 2021-02-24 LAB — CBC
HCT: 30.7 % — ABNORMAL LOW (ref 36.0–46.0)
Hemoglobin: 9.6 g/dL — ABNORMAL LOW (ref 12.0–15.0)
MCH: 27.5 pg (ref 26.0–34.0)
MCHC: 31.3 g/dL (ref 30.0–36.0)
MCV: 88 fL (ref 80.0–100.0)
Platelets: 138 10*3/uL — ABNORMAL LOW (ref 150–400)
RBC: 3.49 MIL/uL — ABNORMAL LOW (ref 3.87–5.11)
RDW: 16.4 % — ABNORMAL HIGH (ref 11.5–15.5)
WBC: 4.8 10*3/uL (ref 4.0–10.5)
nRBC: 0 % (ref 0.0–0.2)

## 2021-02-24 LAB — PROCALCITONIN: Procalcitonin: 6.17 ng/mL

## 2021-02-24 LAB — GLUCOSE, CAPILLARY
Glucose-Capillary: 123 mg/dL — ABNORMAL HIGH (ref 70–99)
Glucose-Capillary: 161 mg/dL — ABNORMAL HIGH (ref 70–99)
Glucose-Capillary: 206 mg/dL — ABNORMAL HIGH (ref 70–99)
Glucose-Capillary: 208 mg/dL — ABNORMAL HIGH (ref 70–99)

## 2021-02-24 LAB — HEMOGLOBIN A1C
Hgb A1c MFr Bld: 9.3 % — ABNORMAL HIGH (ref 4.8–5.6)
Mean Plasma Glucose: 220 mg/dL

## 2021-02-24 LAB — LACTIC ACID, PLASMA: Lactic Acid, Venous: 0.9 mmol/L (ref 0.5–1.9)

## 2021-02-24 MED ORDER — DEXAMETHASONE SODIUM PHOSPHATE 10 MG/ML IJ SOLN
8.0000 mg | Freq: Once | INTRAMUSCULAR | Status: AC
  Administered 2021-02-24: 8 mg via INTRAVENOUS
  Filled 2021-02-24: qty 0.8

## 2021-02-24 MED ORDER — VANCOMYCIN HCL 1500 MG/300ML IV SOLN
1500.0000 mg | Freq: Once | INTRAVENOUS | Status: AC
  Administered 2021-02-24: 1500 mg via INTRAVENOUS
  Filled 2021-02-24: qty 300

## 2021-02-24 MED ORDER — IPRATROPIUM-ALBUTEROL 0.5-2.5 (3) MG/3ML IN SOLN
3.0000 mL | RESPIRATORY_TRACT | Status: DC | PRN
  Administered 2021-02-24: 3 mL via RESPIRATORY_TRACT
  Filled 2021-02-24: qty 3

## 2021-02-24 MED ORDER — SODIUM CHLORIDE 0.9 % IV SOLN
1.5000 g | Freq: Four times a day (QID) | INTRAVENOUS | Status: DC
  Administered 2021-02-24 – 2021-02-27 (×11): 1.5 g via INTRAVENOUS
  Filled 2021-02-24 (×3): qty 4
  Filled 2021-02-24: qty 1.5
  Filled 2021-02-24 (×5): qty 4
  Filled 2021-02-24: qty 1.5
  Filled 2021-02-24 (×2): qty 4
  Filled 2021-02-24: qty 1.5
  Filled 2021-02-24: qty 4
  Filled 2021-02-24: qty 1.5

## 2021-02-24 MED ORDER — VANCOMYCIN HCL 1000 MG/200ML IV SOLN: 1000.0000 mg | INTRAVENOUS | Status: DC

## 2021-02-24 NOTE — Progress Notes (Signed)
PROGRESS NOTE    [name]  IEP:[id number] DOB: [birth year] DOA: 02/22/2021 PCP: [name], NP   Chief Complaint  Patient presents with   Hypoglycemia    Brief Narrative:   [name] is a 65 y.o. Caucasian female with medical history significant for multiple medical problems that are mentioned below, presented to the emergency room with acute onset of altered mental status with confusion and and unresponsiveness where she was found to be hypoglycemic with blood glucose of 38.  She apparently slid from her bed and hit her head, she is with multiple recent hospitalization due to encephalopathy felt to be secondary to multiple etiologies, due to UTI, hyperammonemia, pharmacy, her work-up this admission was significant for UTI.   Assessment & Plan:   Active Problems:   Encephalopathy    Acute metabolic cephalopathy  -Per husband patient is appropriate, awake alert x3 usually -May be related to UTI, hypoglycemia, she had history of hyperammonemia unclear if she was taking her lactulose, will check ammonia level. -CT head with no acute findings.  Will obtain MRI of the brain.  To rule out any ischemic event especially with her history of paroxysmal A. fib, with contraindication for anticoagulation.  Sepsis due to UTI, present on admission  -Present on admission, given tachypnea, tachycardia, altered mental status and elevated lactic acid  -She was empirically on Rocephin, no improvement over last 24 hours, actually her procalcitonin is trending up, so I have added vancomycin for better coverage especially with recent hospitalization she had 2 bottles from 2 different sets positive for staph hominis urine culture growing Enterococcus, so we will stop Rocephin and continue with vancomycin, I have requested infectious disease input.. -Continue with IV fluids  Chest pain, rule out acute coronary syndrome. - This likely atypical.  She had 2 negative sets of troponin I's.   She is chest pain-free rate currently  Diabetes mellitus, insulin-dependent, with hyperglycemia -CBG currently controlled on lower dose Levemir n, Levemir to 5 units twice daily (30 units twice daily at home) continue with insulin sliding scale.  Hypokalemia. - replaced    Paroxysmal A Fib -Not on anticoagulation due to frequent falls   CKD stage 3a -Stable  Acute kidney injury on stage IIIa chronic kidney disease. - The patient will be hydrated with IV normal saline and will follow BMP.  Essential hypertension. - We will continue Norvasc and Coreg. -Of chronic hypotension in the past requiring midodrine, but that seems to be acceptable currently.  Vitamin B12 deficiency. - We will continue vitamin B12.  Depression. - We will continue Zoloft.   DVT prophylaxis: Lovenox Code Status: Full Family Communication: D/W husband by phone Disposition:   Status is: Inpatient  Remains inpatient appropriate because:IV treatments appropriate due to intensity of illness or inability to take PO  Dispo: The patient is from: Home              Anticipated d/c is to: Home              Patient currently is not medically stable to d/c.   Difficult to place patient No       Consultants:  ID   Subjective:  She is complaining of pain all over, otherwise no significant events as discussed with staff.  Objective: Vitals:   02/24/21 0020 02/24/21 0542 02/24/21 0818 02/24/21 1132  BP: 123/67 (!) 156/79 (!) 144/65 125/60  Pulse: 85 86 95 92  Resp: 16 16 15 16   Temp: 98.1 F (  36.7 C) 98 F (36.7 C) 98.3 F (36.8 C) 98.4 F (36.9 C)  TempSrc: Oral Oral Oral   SpO2: 98% 99% 97% 98%  Weight:      Height:        Intake/Output Summary (Last 24 hours) at 02/24/2021 1439 Last data filed at 02/24/2021 1027 Gross per 24 hour  Intake 1914.43 ml  Output 850 ml  Net 1064.43 ml   Filed Weights   02/22/21 1958  Weight: 77.1 kg    Examination:   Awake Alert, confused,  frail, Symmetrical Chest wall movement, Good air movement bilaterally, CTAB RRR,No Gallops,Rubs or new Murmurs, No Parasternal Heave +ve B.Sounds, Abd Soft, No tenderness, No rebound - guarding or rigidity. No Cyanosis, Clubbing or edema, No new Rash or bruise         Data Reviewed: I have personally reviewed following labs and imaging studies  CBC: Recent Labs  Lab 02/22/21 2001 02/23/21 0403 02/24/21 0413  WBC 8.4 7.7 4.8  NEUTROABS 7.1  --   --   HGB 11.4* 10.5* 9.6*  HCT 35.2* 32.2* 30.7*  MCV 86.3 85.0 88.0  PLT 165 136* 138*    Basic Metabolic Panel: Recent Labs  Lab 02/22/21 2001 02/23/21 0403 02/23/21 0627 02/24/21 0413  NA 137 136  --  137  K 3.0* 4.5  --  4.0  CL 109 109  --  113*  CO2 21* 22  --  20*  GLUCOSE 166* 125*  --  72  BUN 36* 30*  --  27*  CREATININE 1.52* 1.22*  --  1.23*  CALCIUM 8.8* 8.5*  --  8.2*  MG  --   --  1.8  --     GFR: Estimated Creatinine Clearance: 43.7 mL/min (A) (by C-G formula based on SCr of 1.23 mg/dL (H)).  Liver Function Tests: Recent Labs  Lab 02/22/21 2001  AST 51*  ALT 33  ALKPHOS 129*  BILITOT 1.1  PROT 7.1  ALBUMIN 3.7    CBG: Recent Labs  Lab 02/23/21 1147 02/23/21 1634 02/23/21 2205 02/24/21 0805 02/24/21 1211  GLUCAP 245* 248* 187* 123* 161*     Recent Results (from the past 240 hour(s))  Culture, blood (routine x 2)     Status: None   Collection Time: 02/15/21 11:58 AM   Specimen: BLOOD  Result Value Ref Range Status   Specimen Description BLOOD THUMB  Final   Special Requests   Final    BOTTLES DRAWN AEROBIC AND ANAEROBIC Blood Culture adequate volume   Culture   Final    NO GROWTH 5 DAYS Performed at [facility], [place] [address], [place], [place] [address]    Report Status 02/20/2021 FINAL  Final  Culture, blood (routine x 2)     Status: None   Collection Time: 02/15/21 12:11 PM   Specimen: BLOOD RIGHT HAND  Result Value Ref Range Status   Specimen Description BLOOD  RIGHT HAND  Final   Special Requests   Final    BOTTLES DRAWN AEROBIC ONLY Blood Culture results may not be optimal due to an inadequate volume of blood received in culture bottles   Culture   Final    NO GROWTH 5 DAYS Performed at [facility], [place] [address], [place], [place] [address]    Report Status 02/20/2021 FINAL  Final  Resp Panel by RT-PCR (Flu A&B, Covid) Nasopharyngeal Swab     Status: None   Collection Time: 02/22/21  8:01 PM  Specimen: Nasopharyngeal Swab; Nasopharyngeal(NP) swabs in vial transport medium  Result Value Ref Range Status   SARS Coronavirus 2 by RT PCR NEGATIVE NEGATIVE Final    Comment: (NOTE) SARS-CoV-2 target nucleic acids are NOT DETECTED.  The SARS-CoV-2 RNA is generally detectable in upper respiratory specimens during the acute phase of infection. The lowest concentration of SARS-CoV-2 viral copies this assay can detect is 138 copies/mL. A negative result does not preclude SARS-Cov-2 infection and should not be used as the sole basis for treatment or other patient management decisions. A negative result may occur with  improper specimen collection/handling, submission of specimen other than nasopharyngeal swab, presence of viral mutation(s) within the areas targeted by this assay, and inadequate number of viral copies(<138 copies/mL). A negative result must be combined with clinical observations, patient history, and epidemiological information. The expected result is Negative.  Fact Sheet for Patients:  EntrepreneurPulse.com.au  Fact Sheet for Healthcare Providers:  IncredibleEmployment.be  This test is no t yet approved or cleared by the [place] FDA and  has been authorized for detection and/or diagnosis of SARS-CoV-2 by FDA under an Emergency Use Authorization (EUA). This EUA will remain  in effect (meaning this test can be used) for the duration of the COVID-19 declaration under Section  564(b)(1) of the Act, 21 U.S.C.section 360bbb-3(b)(1), unless the authorization is terminated  or revoked sooner.       Influenza A by PCR NEGATIVE NEGATIVE Final   Influenza B by PCR NEGATIVE NEGATIVE Final    Comment: (NOTE) The Xpert Xpress SARS-CoV-2/FLU/RSV plus assay is intended as an aid in the diagnosis of influenza from Nasopharyngeal swab specimens and should not be used as a sole basis for treatment. Nasal washings and aspirates are unacceptable for Xpert Xpress SARS-CoV-2/FLU/RSV testing.  Fact Sheet for Patients: EntrepreneurPulse.com.au  Fact Sheet for Healthcare Providers: IncredibleEmployment.be  This test is not yet approved or cleared by the [place] FDA and has been authorized for detection and/or diagnosis of SARS-CoV-2 by FDA under an Emergency Use Authorization (EUA). This EUA will remain in effect (meaning this test can be used) for the duration of the COVID-19 declaration under Section 564(b)(1) of the Act, 21 U.S.C. section 360bbb-3(b)(1), unless the authorization is terminated or revoked.  Performed at [facility], [address] [place], [place] [address]   Urine Culture     Status: Abnormal (Preliminary result)   Collection Time: 02/23/21  1:06 AM   Specimen: Urine, Random  Result Value Ref Range Status   Specimen Description   Final    URINE, RANDOM Performed at [facility], [address], [place], [place] [address]    Special Requests   Final    NONE Performed at [facility], [address] [place], [place] [address]    Culture (A)  Final    30,000 COLONIES/mL ENTEROCOCCUS FAECALIS SUSCEPTIBILITIES TO FOLLOW Performed at [facility], [place] [address] [place], [place] [address]    Report Status PENDING  Incomplete  CULTURE, BLOOD (ROUTINE X 2) w Reflex to ID Panel     Status: None (Preliminary result)   Collection Time: 02/23/21  6:27 AM   Specimen: BLOOD   Result Value Ref Range Status   Specimen Description BLOOD RIGHT HAND  Final   Special Requests   Final    BOTTLES DRAWN AEROBIC AND ANAEROBIC Blood Culture adequate volume   Culture   Final    NO GROWTH < 24 HOURS Performed at [facility],  [place], [place] [address]    Report Status PENDING  Incomplete  CULTURE, BLOOD (ROUTINE X 2) w Reflex to ID Panel     Status: None (Preliminary result)   Collection Time: 02/23/21  6:38 AM   Specimen: BLOOD  Result Value Ref Range Status   Specimen Description BLOOD RIGHT WRIST  Final   Special Requests   Final    BOTTLES DRAWN AEROBIC ONLY Blood Culture results may not be optimal due to an inadequate volume of blood received in culture bottles   Culture   Final    NO GROWTH < 24 HOURS Performed at [facility], [address], [place], [place] [address]    Report Status PENDING  Incomplete         Radiology Studies: DG Chest 2 View  Result Date: 02/22/2021 CLINICAL DATA:  Chest pain.  Hypoglycemia.  Unresponsive. EXAM: CHEST - 2 VIEW COMPARISON:  02/12/2021 FINDINGS: Heart size and pulmonary vascularity are normal. Lungs are clear. Calcification of the aorta. Degenerative changes in the spine and shoulders. No pleural effusions. No pneumothorax. Mediastinal contours appear intact. IMPRESSION: No active cardiopulmonary disease. Electronically Signed   By: Lucienne Capers M.D.   On: 02/22/2021 20:32   CT Head Wo Contrast  Result Date: 02/22/2021 CLINICAL DATA:  Hypoglycemia altered mental status EXAM: CT HEAD WITHOUT CONTRAST TECHNIQUE: Contiguous axial images were obtained from the base of the skull through the vertex without intravenous contrast. COMPARISON:  MRI 02/12/2021, CT brain 02/11/2021 FINDINGS: Brain: No acute territorial infarction, hemorrhage, or intracranial mass. Atrophy and chronic small vessel ischemic changes of the white matter. Stable ventricle size. Vascular: No hyperdense vessels.   Carotid vascular calcification Skull: Normal. Negative for fracture or focal lesion. Sinuses/Orbits: Mucosal thickening in the sphenoid and ethmoid sinuses. Fluid in the sphenoid sinus. Left greater than right mastoid effusion Other: None IMPRESSION: 1. No CT evidence for acute intracranial abnormality. Atrophy and chronic small vessel ischemic changes of the white matter 2. Sinusitis.  Bilateral mastoid effusions. Electronically Signed   By: Donavan Foil M.D.   On: 02/22/2021 20:39   CT Angio Chest PE W and/or Wo Contrast  Result Date: 02/22/2021 CLINICAL DATA:  Positive D-dimer, chest pain, syncope EXAM: CT ANGIOGRAPHY CHEST WITH CONTRAST TECHNIQUE: Multidetector CT imaging of the chest was performed using the standard protocol during bolus administration of intravenous contrast. Multiplanar CT image reconstructions and MIPs were obtained to evaluate the vascular anatomy. CONTRAST:  26m OMNIPAQUE IOHEXOL 350 MG/ML SOLN COMPARISON:  Radiograph 02/22/2021 FINDINGS: Cardiovascular: Satisfactory opacification the pulmonary arteries to the segmental level. No pulmonary artery filling defects are identified. Central pulmonary arteries are normal caliber. Cardiomegaly. Coronary artery atherosclerosis. Trace pericardial fluid, stable from prior. Slight dilatation of the ascending thoracic aorta to 4 cm. No acute luminal abnormality of the imaged aorta. No periaortic stranding or hemorrhage. Minimal plaque within the aorta and tortuous the normally branching proximal great vessels. No major venous abnormalities or significant venous reflux. Mediastinum/Nodes: No mediastinal fluid or gas. Normal thyroid gland and thoracic inlet. No acute abnormality of the trachea. Circumferential thickening of the distal thoracic esophagus without free air or fluid. No worrisome mediastinal, hilar or axillary adenopathy. Lungs/Pleura: Atelectatic changes likely accentuated by imaging during exhalation. No focal consolidative opacity.  Mild peribronchial thickening. No pneumothorax. No effusion. No concerning pulmonary nodules or masses. Upper Abdomen: Cirrhotic changes of the liver including a nodular hepatic surface contour with caudate left lobe hypertrophy. Splenomegaly similar to prior. Upper abdominal  venous collateralization. Mild symmetric bilateral perinephric stranding, a nonspecific finding which may correlate with advanced age or decreased renal function. No acute abnormalities present in the visualized portions of the upper abdomen. Musculoskeletal: No acute osseous abnormality or suspicious osseous lesion. Review of the MIP images confirms the above findings. IMPRESSION: 1. No evidence of pulmonary embolism. 2. Atelectatic changes likely accentuated by imaging during exhalation. 3. Mild peribronchial thickening, could reflect airways inflammation early interstitial edema. 4. Cirrhotic morphology of the liver with nodular surface contour, hepatosplenomegaly and upper abdominal collateralization. 5. Circumferential thickening of the distal thoracic esophagus, could correlate for features of esophagitis. No CT evidence of perforation. 6. Cardiomegaly.  Coronary artery atherosclerosis. 7.  Aortic Atherosclerosis (ICD10-I70.0). 8. Ascending thoracic aortic dilatation to 4 cm. Recommend annual imaging followup by CTA or MRA. This recommendation follows 2010 ACCF/AHA/AATS/ACR/ASA/SCA/SCAI/SIR/STS/SVM Guidelines for the Diagnosis and Management of Patients with Thoracic Aortic Disease. Circulation. 2010; 121: Q224-V146. Aortic aneurysm NOS (ICD10-I71.9). Electronically Signed   By: Lovena Le M.D.   On: 02/22/2021 23:37        Scheduled Meds:  amLODipine  10 mg Oral Daily   carvedilol  3.125 mg Oral BID WC   docusate sodium  100 mg Oral Daily   enoxaparin (LOVENOX) injection  40 mg Subcutaneous Q24H   insulin aspart  0-9 Units Subcutaneous TID AC & HS   insulin detemir  5 Units Subcutaneous BID   lactulose  20 g Oral Daily    polyethylene glycol  17 g Oral BID   sertraline  50 mg Oral Daily   vitamin B-12  1,000 mcg Oral Daily   Continuous Infusions:  sodium chloride Stopped (02/24/21 0634)   [START ON 02/25/2021] vancomycin       LOS: 1 day      Rita Climes, MD [facility]   To contact the attending provider between 7A-7P or the covering provider during after hours 7P-7A, please log into the web site [email or web address] and access using universal [place] password for that web site. If you do not have the password, please call the hospital operator.  02/24/2021, 2:39 PM

## 2021-02-24 NOTE — TOC Progression Note (Signed)
Transition of Care [facility]) - Progression Note    Patient Details  Name: [name] MRN: [id number] Date of Birth: 07-31-1956  Transition of Care [facility]) CM/SW Contact  Su Hilt, RN Phone Number: 02/24/2021, 9:27 AM  Clinical Narrative:     Called husband and unable to reach, left a VM for a call back  Expected Discharge Plan: [place] Barriers to Discharge: Continued Medical Work up  Expected Discharge Plan and Services Expected Discharge Plan: [place]   Discharge Planning Services: CM Consult   Living arrangements for the past 2 months: Mobile Home                 DME Arranged: N/A                     Social Determinants of Health (SDOH) Interventions    Readmission Risk Interventions Readmission Risk Prevention Plan 10/01/2019 07/18/2019 07/16/2019  Transportation Screening Complete Complete Complete  Medication Review Press photographer) Complete Complete Complete  PCP or Specialist appointment within 3-5 days of discharge Complete Complete -  [place] or Home Care Consult Complete Complete Complete  SW Recovery Care/Counseling Consult Complete Complete -  Palliative Care Screening Not Applicable Not Applicable Not [place] - Not Applicable Patient Refused  Some recent data might be hidden

## 2021-02-24 NOTE — Progress Notes (Signed)
Pharmacy Antibiotic Note  [name] is a 65 y.o. female admitted on 02/22/2021 with acute metabolic cephalopathy and sepsis secondary to UTI. Patient is on day 2 ceftriaxone. Per MD, patient remains septic. He has consulted pharmacy for Vancomycin dosing. WBC: wnl, afebrile, PCT: 5.04>>6.17   Plan: Will give Vancomycin 1586m IV x 1 loading dose, then start Vancomycin [id number] hours.  Goal AUC: 400-550 Scr: 1.23   Calculated AUC: 495 Css Max: 36.3   Css Min: 10.7  T1/2: 19, Ke: 0.035, Vd: 38.6     Height: 5' 3"  (160 cm) Weight: 77.1 kg (170 lb) IBW/kg (Calculated) : 52.4  Temp (24hrs), Avg:98.1 F (36.7 C), Min:97.7 F (36.5 C), Max:98.3 F (36.8 C)  Recent Labs  Lab 02/22/21 2001 02/23/21 0403 02/23/21 0627 02/24/21 0413  WBC 8.4 7.7  --  4.8  CREATININE 1.52* 1.22*  --  1.23*  LATICACIDVEN  --   --  2.5* 0.9    Estimated Creatinine Clearance: 43.7 mL/min (A) (by C-G formula based on SCr of 1.23 mg/dL (H)).    Allergies  Allergen Reactions   Latex Other (See Comments)    Pt had allergic reaction to tegaderm dressing on IV.  Caused blisters on the skin.   Aspirin Rash   Codeine Sulfate Rash   Erythromycin Rash and Other (See Comments)    Fever, also   Prednisone Swelling   Rosiglitazone Maleate Swelling   Tetanus-Diphtheria Toxoids Td Rash and Other (See Comments)    Fever, also    Antimicrobials this admission: 6/16 Ceftriaxone >>  6/17 Vancomycin >>   Microbiology results: 6/16 BCx: NG TD 6/16 UCx: pending   Thank you for allowing pharmacy to be a part of this patient's care.  ShPernell DuprePharmD, BCPS Clinical Pharmacist 02/24/2021 9:23 AM

## 2021-02-24 NOTE — Consult Note (Signed)
NAME: [name]  DOB: 1956/01/26  MRN: [id number]  Date/Time: 02/24/2021 1:28 PM  REQUESTING PROVIDER: elgergawy Subjective:  REASON FOR CONSULT: UTI ? [name] is a 65 y.o. female with a history of Diabetes mellitus, hypothyroidism, cirrhosis, CKD, TIA, paroxysmal A. fib not on anticoagulation Presents to the ED brought in by EMS from home with unresponsiveness hypoglycemia.  On 02/22/2021.  As per EMS patient was unresponsive to her husband and so she called EMS.  Blood glucose was 38 on arrival.  EMS gave glucagon 10 mL and 200 cc of D10.  T CBG after that was 262.  On arrival to our ED patient is alert but was oriented to self at that time.  Vitals in the ED BP 115/70, temperature 98.6, heart rate 95, sats 96%, respiratory rate 14.  Labs revealed WBC of 8.4, Hb 11.4, HCT 35.2 and creatinine of 1.52.  UA showed WBC of more than 30.  Urine culture Blood and urine cultures were sent.  She was started on IV ceftriaxone.   Was recently in the hospital between 02/11/2021 till 02/16/2021 for u altered mental status.  It was thought to have metabolic/toxic encephalopathy.  At that admission she was treated for a UTI and With a combination of Rocephin and doxycycline.  She also had high ammonia and was treated with lactulose and MiraLAX. During that hospitalization she had staff hominis positive blood culture 2 out of 2.  She did not have any fever or leukocytosis there was no hardware and prolactin was undetectable so after discussing with ID they thought it was contaminant and they did not pursue this further.  I am seeing this patient for possible sepsis Pt says she has had a cough for 2 weeks, yellow sputum, some left sided chest pain especially with coughing and deep breath CTA no PE or pneumonia Says for the past 6 months she has had frequent dizziness and has fallen many times.  She has pressure in her bladder when passing urine- some pain but no dysuria  Past Medical History:   Diagnosis Date   Acute encephalopathy 05/22/2018   Allergy    Anginal pain (HCC)    Anxiety    Ascites    C. difficile colitis 07/10/2015   Cancer ([place])    HX OF CANCER OF UTERUS    Chronic kidney disease    States she only has 1 working kidney   Cirrhosis of liver not due to alcohol ([place]) 2016   Degenerative disk disease    Diverticulitis    Gastroparesis    GERD (gastroesophageal reflux disease)    History of hiatal hernia    Hypertension    Hypothyroid    Hypothyroidism due to amiodarone    Ileus ([place]) 08/01/2015   Intussusception intestine ([place]) 05/2015   Orthostatic hypotension    PAF (paroxysmal atrial fibrillation) ([place]) 03/2015   a. new onset 03/2015 in setting of intractable N/V; b. on Eliquis 5 mg bid; c. CHADSVASc 4 (DM, TIA x 2, female)   Pancreatitis    Pneumonia 11/14/2015   Pneumonia 07/2020   Right ureteral stone 07/14/2016   Sepsis ([place]) 12/15/2017   Sick sinus syndrome ([place])    Stomach ulcer    Stroke ([place])    with minimal left sided weakness   Syncope 01/2015   Syncope due to orthostatic hypotension 05/18/2015   Tachyarrhythmia 01/10/2016   TIA (transient ischemic attack) 02/2015   Type 1 diabetes ([place])    on levemir  UTERINE CANCER, HX OF 03/27/2007   Qualifier: Diagnosis of  By: Maxie Better FNP, Rosalita Levan    UTI (urinary tract infection) 05/22/2018    Past Surgical History:  Procedure Laterality Date   ABDOMINAL HYSTERECTOMY     CARDIAC CATHETERIZATION N/A 01/12/2016   Procedure: Left Heart Cath and Coronary Angiography;  Surgeon: Wellington [place], MD;  Location: [facility];  Service: Cardiovascular;  Laterality: N/A;   CHOLECYSTECTOMY     CYSTOSCOPY W/ RETROGRADES Bilateral 12/11/2019   Procedure: CYSTOSCOPY WITH RETROGRADE PYELOGRAM;  Surgeon: Billey Co, MD;  Location: [facility];  Service: Urology;  Laterality: Bilateral;   CYSTOSCOPY WITH BIOPSY N/A 12/11/2019   Procedure: CYSTOSCOPY WITH BLADDER BIOPSY;  Surgeon: Billey Co, MD;   Location: [facility];  Service: Urology;  Laterality: N/A;   CYSTOSCOPY WITH FULGERATION N/A 12/11/2019   Procedure: CYSTOSCOPY WITH FULGERATION;  Surgeon: Billey Co, MD;  Location: [facility];  Service: Urology;  Laterality: N/A;   CYSTOSCOPY WITH STENT PLACEMENT Right 12/11/2019   Procedure: CYSTOSCOPY WITH STENT PLACEMENT;  Surgeon: Billey Co, MD;  Location: [facility];  Service: Urology;  Laterality: Right;   CYSTOSCOPY WITH URETHRAL DILATATION Right 12/11/2019   Procedure: CYSTOSCOPY WITH URETERAL DILATATION;  Surgeon: Billey Co, MD;  Location: [facility];  Service: Urology;  Laterality: Right;   CYSTOSCOPY/URETEROSCOPY/HOLMIUM LASER Right 07/14/2016   Procedure: CYSTOSCOPY/URETEROSCOPY/HOLMIUM LASER;  Surgeon: Alexis Frock, MD;  Location: [facility];  Service: Urology;  Laterality: Right;   ESOPHAGOGASTRODUODENOSCOPY N/A 04/04/2015   Procedure: ESOPHAGOGASTRODUODENOSCOPY (EGD);  Surgeon: Hulen Luster, MD;  Location: [facility];  Service: Endoscopy;  Laterality: N/A;   ESOPHAGOGASTRODUODENOSCOPY N/A 12/28/2017   Procedure: ESOPHAGOGASTRODUODENOSCOPY (EGD);  Surgeon: Lin Landsman, MD;  Location: [facility];  Service: Gastroenterology;  Laterality: N/A;   ESOPHAGOGASTRODUODENOSCOPY (EGD) WITH PROPOFOL N/A 01/18/2016   Procedure: ESOPHAGOGASTRODUODENOSCOPY (EGD) WITH PROPOFOL;  Surgeon: Lucilla Lame, MD;  Location: [facility];  Service: Endoscopy;  Laterality: N/A;   FLEXIBLE SIGMOIDOSCOPY N/A 01/18/2016   Procedure: FLEXIBLE SIGMOIDOSCOPY;  Surgeon: Lucilla Lame, MD;  Location: [facility];  Service: Endoscopy;  Laterality: N/A;   HERNIA REPAIR     URETEROSCOPY Right 12/11/2019   Procedure: Christen Butter;  Surgeon: Billey Co, MD;  Location: [facility];  Service: Urology;  Laterality: Right;    Social History   Socioeconomic History   Marital status: Married    Spouse name: Sansa Alkema    Number of children: Not on file   Years of education: Not on file    Highest education level: Not on file  Occupational History   Occupation: Disabled 2nd back problems  Tobacco Use   Smoking status: Former    Pack years: 0.00    Types: Cigarettes   Smokeless tobacco: Never   Tobacco comments:    25 years ago and only smoked occasionally  Vaping Use   Vaping Use: Never used  Substance and Sexual Activity   Alcohol use: No   Drug use: No   Sexual activity: Not Currently  Other Topics Concern   Not on file  Social History Narrative   Lives in [place], [place] with her husband and 2 sons.   Social Determinants of Health   Financial Resource Strain: Not on file  Food Insecurity: Not on file  Transportation Needs: Not on file  Physical Activity: Not on file  Stress: Not on file  Social Connections: Not on file  Intimate Partner Violence: Not on file    Family History  Problem Relation Age of Onset   Hypertension Mother    CAD Sister    Heart attack Sister        Deceased 11/19/2014   CAD Brother    Allergies  Allergen Reactions   Latex Other (See Comments)    Pt had allergic reaction to tegaderm dressing on IV.  Caused blisters on the skin.   Aspirin Rash   Codeine Sulfate Rash   Erythromycin Rash and Other (See Comments)    Fever, also   Prednisone Swelling   Rosiglitazone Maleate Swelling   Tetanus-Diphtheria Toxoids Td Rash and Other (See Comments)    Fever, also   I? Current Facility-Administered Medications  Medication Dose Route Frequency Provider Last Rate Last Admin   0.9 %  sodium chloride infusion   Intravenous Continuous Elgergawy, Silver Huguenin, MD   Stopped at 02/24/21 [phone]   acetaminophen (TYLENOL) tablet 650 mg  650 mg Oral Q6H PRN Mansy, Jan A, MD       Or   acetaminophen (TYLENOL) suppository 650 mg  650 mg Rectal Q6H PRN Mansy, Jan A, MD       amLODipine (NORVASC) tablet 10 mg  10 mg Oral Daily Mansy, Jan A, MD   10 mg at 02/24/21 0843   carvedilol (COREG) tablet 3.125 mg  3.125 mg Oral BID WC Mansy, Jan A, MD   3.125 mg at  02/24/21 0843   docusate sodium (COLACE) capsule 100 mg  100 mg Oral Daily Mansy, Jan A, MD   100 mg at 02/24/21 0843   enoxaparin (LOVENOX) injection 40 mg  40 mg Subcutaneous Q24H Mansy, Jan A, MD   40 mg at 02/24/21 0843   insulin aspart (novoLOG) injection 0-9 Units  0-9 Units Subcutaneous TID [facility] Mansy, Jan A, MD   2 Units at 02/24/21 1216   insulin detemir (LEVEMIR) injection 5 Units  5 Units Subcutaneous BID Elgergawy, Silver Huguenin, MD   5 Units at 02/24/21 0844   lactulose (CHRONULAC) 10 GM/15ML solution 20 g  20 g Oral Daily Mansy, Jan A, MD   20 g at 02/24/21 0843   magnesium hydroxide (MILK OF MAGNESIA) suspension 30 mL  30 mL Oral Daily PRN Mansy, Jan A, MD       melatonin tablet 10 mg  10 mg Oral QHS PRN Mansy, Jan A, MD       morphine 2 MG/ML injection 1 mg  1 mg Intravenous Q4H PRN Elgergawy, Silver Huguenin, MD   1 mg at 02/24/21 1217   nitroGLYCERIN (NITROSTAT) SL tablet 0.4 mg  0.4 mg Sublingual Q5 min PRN Mansy, Jan A, MD       ondansetron [facility]) tablet 4 mg  4 mg Oral Q6H PRN Mansy, Jan A, MD       Or   ondansetron [facility]) injection 4 mg  4 mg Intravenous Q6H PRN Mansy, Jan A, MD       polyethylene glycol (MIRALAX / GLYCOLAX) packet 17 g  17 g Oral BID Mansy, Jan A, MD   17 g at 02/23/21 11/19/26   sertraline (ZOLOFT) tablet 50 mg  50 mg Oral Daily Mansy, Jan A, MD   50 mg at 02/24/21 0843   traMADol (ULTRAM) tablet 50 mg  50 mg Oral Q12H PRN Elgergawy, Silver Huguenin, MD   50 mg at 02/24/21 0903   traZODone (DESYREL) tablet 25 mg  25 mg Oral QHS PRN Mansy, Arvella Merles, MD       [START ON 02/25/2021]  vancomycin (VANCOREADY) IVPB 1000 mg/200 mL  1,000 mg Intravenous Q36H Hallaji, Sheema M, RPH       vitamin B-12 (CYANOCOBALAMIN) tablet 1,000 mcg  1,000 mcg Oral Daily Mansy, Jan A, MD   1,000 mcg at 02/24/21 0845     Abtx:  Anti-infectives (From admission, onward)    Start     Dose/Rate Route Frequency Ordered Stop   02/25/21 2200  vancomycin (VANCOREADY) IVPB 1000 mg/200 mL        1,000  mg 200 mL/hr over 60 Minutes Intravenous Every 36 hours 02/24/21 0926     02/24/21 0800  vancomycin (VANCOREADY) IVPB 1500 mg/300 mL        1,500 mg 150 mL/hr over 120 Minutes Intravenous  Once 02/24/21 0741 02/24/21 1106   02/23/21 0600  cefTRIAXone (ROCEPHIN) 1 g in sodium chloride 0.9 % 100 mL IVPB  Status:  Discontinued        1 g 200 mL/hr over 30 Minutes Intravenous Every 24 hours 02/23/21 0337 02/24/21 1314       REVIEW OF SYSTEMS:  Const: negative fever, negative chills, negative weight loss Eyes: negative diplopia or visual changes, negative eye pain ENT: negative coryza, negative sore throat Resp:  cough, no hemoptysis, dyspnea Cards: left r chest pain,no  palpitations, lower extremity edema GU: negative for frequency, dysuria and hematuria GI: Negative for abdominal pain, diarrhea, bleeding, constipation Skin: negative for rash and pruritus Heme: negative for easy bruising and gum/nose bleeding MS: general weakness Neurolo:dizziness, syncope, falls    Psych: negative for feelings of anxiety, depression  Endocrine: , diabetes Allergy/Immunology- multiple drug allergies    Objective:  VITALS:  Patient Vitals for the past 24 hrs:  BP Temp Temp src Pulse Resp SpO2  02/24/21 1132 125/60 98.4 F (36.9 C) -- 92 16 98 %  02/24/21 0818 (!) 144/65 98.3 F (36.8 C) Oral 95 15 97 %  02/24/21 0542 (!) 156/79 98 F (36.7 C) Oral 86 16 99 %  02/24/21 0020 123/67 98.1 F (36.7 C) Oral 85 16 98 %  02/23/21 2035 110/65 97.9 F (36.6 C) Oral 74 16 98 %  02/23/21 1529 (!) 114/59 98.3 F (36.8 C) Oral 82 17 99 %  02/23/21 1441 110/65 -- -- 83 -- --    BP 125/60   Pulse 92   Temp 98.4 F (36.9 C)   Resp 16   Ht 5' 3"  (1.6 m)   Wt 77.1 kg   SpO2 98%   BMI 30.11 kg/m  PHYSICAL EXAM:  General: Alert, cooperative, no distress, appears stated age.  Head: Normocephalic, without obvious abnormality, atraumatic. Eyes: Conjunctivae clear, anicteric sclerae. Pupils are  equal ENT Nares normal. No drainage or sinus tenderness. Lips, mucosa, and tongue normal. No Thrush Neck: Supple, symmetrical, no adenopathy, thyroid: non tender no carotid bruit and no JVD. Back: No CVA tenderness. Lungs: b/l air entry- few rhonchi Heart: irregular Abdomen: Soft, non-tender,not distended. Bowel sounds normal. No masses Extremities: atraumatic, no cyanosis. No edema. No clubbing Skin: scratches over lower extremities Lymph: Cervical, supraclavicular normal. Neurologic: Grossly non-focal Pertinent Labs Lab Results CBC    Component Value Date/Time   WBC 4.8 02/24/2021 0413   RBC 3.49 (L) 02/24/2021 0413   HGB 9.6 (L) 02/24/2021 0413   HGB 12.9 10/13/2014 0357   HCT 30.7 (L) 02/24/2021 0413   HCT 38.4 10/13/2014 0357   PLT 138 (L) 02/24/2021 0413   PLT 113 (L) 10/13/2014 0357   MCV 88.0 02/24/2021 0413  MCV 96 10/13/2014 0357   MCH 27.5 02/24/2021 0413   MCHC 31.3 02/24/2021 0413   RDW 16.4 (H) 02/24/2021 0413   RDW 13.4 10/13/2014 0357   LYMPHSABS 0.7 02/22/2021 2001   LYMPHSABS 2.1 10/13/2014 0357   MONOABS 0.5 02/22/2021 2001   MONOABS 0.8 10/13/2014 0357   EOSABS 0.1 02/22/2021 2001   EOSABS 0.2 10/13/2014 0357   BASOSABS 0.0 02/22/2021 2001   BASOSABS 0.0 10/13/2014 0357    CMP Latest Ref Rng & Units 02/24/2021 02/23/2021 02/22/2021  Glucose 70 - 99 mg/dL 72 125(H) 166(H)  BUN 8 - 23 mg/dL 27(H) 30(H) 36(H)  Creatinine 0.44 - 1.00 mg/dL 1.23(H) 1.22(H) 1.52(H)  Sodium 135 - 145 mmol/L 137 136 137  Potassium 3.5 - 5.1 mmol/L 4.0 4.5 3.0(L)  Chloride 98 - 111 mmol/L 113(H) 109 109  CO2 22 - 32 mmol/L 20(L) 22 21(L)  Calcium 8.9 - 10.3 mg/dL 8.2(L) 8.5(L) 8.8(L)  Total Protein 6.5 - 8.1 g/dL - - 7.1  Total Bilirubin 0.3 - 1.2 mg/dL - - 1.1  Alkaline Phos 38 - 126 U/L - - 129(H)  AST 15 - 41 U/L - - 51(H)  ALT 0 - 44 U/L - - 33      Microbiology: Recent Results (from the past 240 hour(s))  Culture, blood (routine x 2)     Status: None    Collection Time: 02/15/21 11:58 AM   Specimen: BLOOD  Result Value Ref Range Status   Specimen Description BLOOD THUMB  Final   Special Requests   Final    BOTTLES DRAWN AEROBIC AND ANAEROBIC Blood Culture adequate volume   Culture   Final    NO GROWTH 5 DAYS Performed at [facility], [address], [place], [place] [address]    Report Status 02/20/2021 FINAL  Final  Culture, blood (routine x 2)     Status: None   Collection Time: 02/15/21 12:11 PM   Specimen: BLOOD RIGHT HAND  Result Value Ref Range Status   Specimen Description BLOOD RIGHT HAND  Final   Special Requests   Final    BOTTLES DRAWN AEROBIC ONLY Blood Culture results may not be optimal due to Rita inadequate volume of blood received in culture bottles   Culture   Final    NO GROWTH 5 DAYS Performed at [facility], [place] [address] [place], [place] [address]    Report Status 02/20/2021 FINAL  Final  Resp Panel by RT-PCR (Flu A&B, Covid) Nasopharyngeal Swab     Status: None   Collection Time: 02/22/21  8:01 PM   Specimen: Nasopharyngeal Swab; Nasopharyngeal(NP) swabs in vial transport medium  Result Value Ref Range Status   SARS Coronavirus 2 by RT PCR NEGATIVE NEGATIVE Final    Comment: (NOTE) SARS-CoV-2 target nucleic acids are NOT DETECTED.  The SARS-CoV-2 RNA is generally detectable in upper respiratory specimens during the acute phase of infection. The lowest concentration of SARS-CoV-2 viral copies this assay can detect is 138 copies/mL. A negative result does not preclude SARS-Cov-2 infection and should not be used as the sole basis for treatment or other patient management decisions. A negative result may occur with  improper specimen collection/handling, submission of specimen other than nasopharyngeal swab, presence of viral mutation(s) within the areas targeted by this assay, and inadequate number of viral copies(<138 copies/mL). A negative result must be combined with clinical observations,  patient history, and epidemiological information. The expected result is Negative.  Fact Sheet for Patients:  EntrepreneurPulse.com.au  Fact Sheet for Healthcare Providers:  IncredibleEmployment.be  This test is no t yet approved or cleared by the [place] FDA and  has been authorized for detection and/or diagnosis of SARS-CoV-2 by FDA under Rita Emergency Use Authorization (EUA). This EUA will remain  in effect (meaning this test can be used) for the duration of the COVID-19 declaration under Section 564(b)(1) of the Act, 21 U.S.C.section 360bbb-3(b)(1), unless the authorization is terminated  or revoked sooner.       Influenza A by PCR NEGATIVE NEGATIVE Final   Influenza B by PCR NEGATIVE NEGATIVE Final    Comment: (NOTE) The Xpert Xpress SARS-CoV-2/FLU/RSV plus assay is intended as Rita aid in the diagnosis of influenza from Nasopharyngeal swab specimens and should not be used as a sole basis for treatment. Nasal washings and aspirates are unacceptable for Xpert Xpress SARS-CoV-2/FLU/RSV testing.  Fact Sheet for Patients: EntrepreneurPulse.com.au  Fact Sheet for Healthcare Providers: IncredibleEmployment.be  This test is not yet approved or cleared by the [place] FDA and has been authorized for detection and/or diagnosis of SARS-CoV-2 by FDA under [place] Emergency Use Authorization (EUA). This EUA will remain in effect (meaning this test can be used) for the duration of the COVID-19 declaration under Section 564(b)(1) of the Act, 21 U.S.C. section 360bbb-3(b)(1), unless the authorization is terminated or revoked.  Performed at [facility] [address] [place], [place] [address]   Urine Culture     Status: Abnormal (Preliminary result)   Collection Time: 02/23/21  1:06 AM   Specimen: Urine, Random  Result Value Ref Range Status   Specimen Description   Final    URINE,  RANDOM Performed at [facility], [address], [place], [place] [address]    Special Requests   Final    NONE Performed at [facility], [address], [place], [place] [address]    Culture (A)  Final    30,000 COLONIES/mL ENTEROCOCCUS FAECALIS SUSCEPTIBILITIES TO FOLLOW Performed at [facility], [place] [address] [place], [place] [address]    Report Status PENDING  Incomplete  CULTURE, BLOOD (ROUTINE X 2) w Reflex to ID Panel     Status: None (Preliminary result)   Collection Time: 02/23/21  6:27 AM   Specimen: BLOOD  Result Value Ref Range Status   Specimen Description BLOOD RIGHT HAND  Final   Special Requests   Final    BOTTLES DRAWN AEROBIC AND ANAEROBIC Blood Culture adequate volume   Culture   Final    NO GROWTH < 24 HOURS Performed at [facility], [address], [place], [place] [address]    Report Status PENDING  Incomplete  CULTURE, BLOOD (ROUTINE X 2) w Reflex to ID Panel     Status: None (Preliminary result)   Collection Time: 02/23/21  6:38 AM   Specimen: BLOOD  Result Value Ref Range Status   Specimen Description BLOOD RIGHT WRIST  Final   Special Requests   Final    BOTTLES DRAWN AEROBIC ONLY Blood Culture results may not be optimal due to Rita inadequate volume of blood received in culture bottles   Culture   Final    NO GROWTH < 24 HOURS Performed at [facility], [address] [place], [place] [address]    Report Status PENDING  Incomplete    IMAGING RESULTS: CT scan no evidence of acute intracranial abnormality.  Atrophy and chronic small vessel ischemic changes of the white matter.  Bilateral  mastoid effusion seen.  CTA.     Atelectatic changes. Upper abdomen showed cirrhotic changes of the liver including a nodular hepatic surface contour with caudate left lobe hypertrophy.  Splenomegaly.  Upper abdominal venous collateralization. Circumferential thickening of the distal thoracic esophagus could  correlate to features of esophagitis. Mild peribronchial thickening. ? Impression/Recommendation ? Altered mental status unresponsiveness secondary to hypoglycemia.  Recovered with glucagon and D10 This could be a reflection of underlying cirrhosis but need to rule out sepsis. She has no fever or leukocytosis but has increasing procalcitonin.  Because of underlying cirrhosis with portal hypertension she is at risk for translocation of bacteria from the intestines.  Enterococcus faecalis in Urine - could be a contaminant VS true pathogen especially with some symptoms  Currently on vanco- will change to ampicillin/sulbactam  Cough for 2 weeks- no pneumonia- could be bronchitis Unasyn will cover this as well  Cirrhosis of the liver with signs of portal hypertension Increased ammonia last admission- resolved  DM-management as per primary team  Staph hominis in blood culture last admission was deemed a contaminant Repeat cultures were negative. We will not treat that now unless admission culture is positive No hardware in the body  Dizziness, syncope and fall- ECHOs checked in NOV and cortisol normal  CKD- improving  Paroxysmal Afib   ____ID will follow her peripherally this weekend- call if needed _______________________________________________ Discussed with patient, Note:  This document was prepared using Dragon voice recognition software and may include unintentional dictation errors.

## 2021-02-24 NOTE — Plan of Care (Signed)

## 2021-02-25 DIAGNOSIS — G9340 Encephalopathy, unspecified: Secondary | ICD-10-CM | POA: Diagnosis not present

## 2021-02-25 DIAGNOSIS — N390 Urinary tract infection, site not specified: Secondary | ICD-10-CM | POA: Diagnosis not present

## 2021-02-25 DIAGNOSIS — K7469 Other cirrhosis of liver: Secondary | ICD-10-CM | POA: Diagnosis not present

## 2021-02-25 LAB — URINE CULTURE: Culture: 30000 — AB

## 2021-02-25 LAB — GLUCOSE, CAPILLARY
Glucose-Capillary: 394 mg/dL — ABNORMAL HIGH (ref 70–99)
Glucose-Capillary: 408 mg/dL — ABNORMAL HIGH (ref 70–99)
Glucose-Capillary: 246 mg/dL — ABNORMAL HIGH (ref 70–99)
Glucose-Capillary: 266 mg/dL — ABNORMAL HIGH (ref 70–99)

## 2021-02-25 LAB — CBC
HCT: 31.7 % — ABNORMAL LOW (ref 36.0–46.0)
Hemoglobin: 10.3 g/dL — ABNORMAL LOW (ref 12.0–15.0)
MCH: 27.8 pg (ref 26.0–34.0)
MCHC: 32.5 g/dL (ref 30.0–36.0)
MCV: 85.4 fL (ref 80.0–100.0)
Platelets: 122 10*3/uL — ABNORMAL LOW (ref 150–400)
RBC: 3.71 MIL/uL — ABNORMAL LOW (ref 3.87–5.11)
RDW: 15.9 % — ABNORMAL HIGH (ref 11.5–15.5)
WBC: 3.8 10*3/uL — ABNORMAL LOW (ref 4.0–10.5)
nRBC: 0 % (ref 0.0–0.2)

## 2021-02-25 LAB — BASIC METABOLIC PANEL
Anion gap: 5 (ref 5–15)
BUN: 22 mg/dL (ref 8–23)
CO2: 19 mmol/L — ABNORMAL LOW (ref 22–32)
Calcium: 8.4 mg/dL — ABNORMAL LOW (ref 8.9–10.3)
Chloride: 109 mmol/L (ref 98–111)
Creatinine, Ser: 1.15 mg/dL — ABNORMAL HIGH (ref 0.44–1.00)
GFR, Estimated: 52 mL/min — ABNORMAL LOW (ref >60)
Glucose, Bld: 350 mg/dL — ABNORMAL HIGH (ref 70–99)
Potassium: 4.6 mmol/L (ref 3.5–5.1)
Sodium: 133 mmol/L — ABNORMAL LOW (ref 135–145)

## 2021-02-25 LAB — AMMONIA: Ammonia: 27 umol/L (ref 9–35)

## 2021-02-25 LAB — PROCALCITONIN: Procalcitonin: 3.05 ng/mL

## 2021-02-25 LAB — GLUCOSE, RANDOM: Glucose, Bld: 427 mg/dL — ABNORMAL HIGH (ref 70–99)

## 2021-02-25 MED ORDER — INSULIN ASPART 100 UNIT/ML IJ SOLN
0.0000 [IU] | Freq: Three times a day (TID) | INTRAMUSCULAR | Status: DC
  Administered 2021-02-25 – 2021-02-26 (×2): 5 [IU] via SUBCUTANEOUS
  Administered 2021-02-26: 3 [IU] via SUBCUTANEOUS
  Administered 2021-02-26: 8 [IU] via SUBCUTANEOUS
  Filled 2021-02-25 (×4): qty 1

## 2021-02-25 MED ORDER — INSULIN ASPART 100 UNIT/ML IJ SOLN
0.0000 [IU] | Freq: Every day | INTRAMUSCULAR | Status: DC
  Administered 2021-02-25: 3 [IU] via SUBCUTANEOUS
  Administered 2021-02-26: 2 [IU] via SUBCUTANEOUS
  Filled 2021-02-25 (×2): qty 1

## 2021-02-25 MED ORDER — INSULIN DETEMIR 100 UNIT/ML ~~LOC~~ SOLN
20.0000 [IU] | Freq: Two times a day (BID) | SUBCUTANEOUS | Status: DC
  Administered 2021-02-25: 20 [IU] via SUBCUTANEOUS
  Filled 2021-02-25 (×3): qty 0.2

## 2021-02-25 MED ORDER — INSULIN DETEMIR 100 UNIT/ML ~~LOC~~ SOLN
20.0000 [IU] | Freq: Once | SUBCUTANEOUS | Status: AC
  Administered 2021-02-25: 20 [IU] via SUBCUTANEOUS
  Filled 2021-02-25: qty 0.2

## 2021-02-25 MED ORDER — SODIUM CHLORIDE 0.9 % IV BOLUS
250.0000 mL | Freq: Once | INTRAVENOUS | Status: AC
  Administered 2021-02-25: 250 mL via INTRAVENOUS

## 2021-02-25 MED ORDER — INSULIN ASPART 100 UNIT/ML IJ SOLN
12.0000 [IU] | Freq: Once | INTRAMUSCULAR | Status: AC
  Administered 2021-02-25: 12 [IU] via SUBCUTANEOUS
  Filled 2021-02-25: qty 1

## 2021-02-25 NOTE — Progress Notes (Signed)
PROGRESS NOTE    [name]  HRC:[id number] DOB: 12-01-1955 DOA: 02/22/2021 PCP: [name], NP   Chief Complaint  Patient presents with   Hypoglycemia    Brief Narrative:   [name] is a 65 y.o. Caucasian female with medical history significant for multiple medical problems that are mentioned below, presented to the emergency room with acute onset of altered mental status with confusion and and unresponsiveness where she was found to be hypoglycemic with blood glucose of 38.  She apparently slid from her bed and hit her head, she is with multiple recent hospitalization due to encephalopathy felt to be secondary to multiple etiologies, due to UTI, hyperammonemia, pharmacy, her work-up this admission was significant for UTI.   Assessment & Plan:   Active Problems:   Encephalopathy    Acute metabolic cephalopathy  -Per husband patient is appropriate, awake alert x3 usually -May be related to UTI, hypoglycemia, she had history of hyperammonemia in the past as well. -CT head with no acute findings.  Jennefer Bravo of the brain with no acute intracranial abnormality as well.   -Mental status is improving.  Sepsis due to UTI, present on admission  -Present on admission, given tachypnea, tachycardia, altered mental status and elevated lactic acid  -Urine culture growing Enterococcus, change Rocephin to Unasyn, ID input greatly appreciated.  Liver cirrhosis -With recent diagnosis of hyperammonemia, CT chest last March with evidence of cirrhosis, will check hepatitis panel, she denies any history of alcohol abuse, this is most likely in the setting of Karlene Lineman -We will continue with lactulose for now, will check hepatitis panel. -Further work-up as an outpatient.  Chest pain, rule out acute coronary syndrome. - This likely atypical.  She had 2 negative sets of troponin I's.  She is chest pain-free rate currently  Diabetes mellitus, insulin-dependent, with  hypoglycemia/hyperglycemia -Patient initially presents with hypoglycemia at 38, her CBG currently uncontrolled in the 400s range, so we will increase her Levemir to 25 units twice daily, will change her sliding scale to moderate.    Hypokalemia. - replaced    Paroxysmal A Fib -Not on anticoagulation due to frequent falls   CKD stage 3a -Stable  Acute kidney injury on stage IIIa chronic kidney disease. - The patient will be hydrated with IV normal saline and will follow BMP.  Essential hypertension. - We will continue Norvasc and Coreg. -Of chronic hypotension in the past requiring midodrine, but that seems to be acceptable currently.  Vitamin B12 deficiency. - We will continue vitamin B12.  Depression. - We will continue Zoloft.   DVT prophylaxis: Lovenox Code Status: Full Family Communication: D/W husband by phone 6/17 Disposition:   Status is: Inpatient  Remains inpatient appropriate because:IV treatments appropriate due to intensity of illness or inability to take PO  Dispo: The patient is from: Home              Anticipated d/c is to: Home              Patient currently is not medically stable to d/c.   Difficult to place patient No       Consultants:  ID   Subjective:  She reports pain all over, otherwise no significant events overnight  Objective: Vitals:   02/24/21 2052 02/25/21 0056 02/25/21 0536 02/25/21 0851  BP: (!) 142/72 138/67 (!) 141/73 137/70  Pulse: 81 83 88 98  Resp: 16 16 16 16   Temp: 98.1 F (36.7 C) 98.1 F (36.7 C) 97.8 F (  36.6 C) 98.7 F (37.1 C)  TempSrc: Oral Oral Oral   SpO2: 100% 95% 99% 98%  Weight:      Height:        Intake/Output Summary (Last 24 hours) at 02/25/2021 1216 Last data filed at 02/25/2021 1037 Gross per 24 hour  Intake 1366.05 ml  Output 1050 ml  Net 316.05 ml   Filed Weights   02/22/21 1958  Weight: 77.1 kg    Examination:   Awake Alert, Oriented X 2, she is more coherent and appropriate  today. Symmetrical Chest wall movement, Good air movement bilaterally, CTAB RRR,No Gallops,Rubs or new Murmurs, No Parasternal Heave +ve B.Sounds, Abd Soft, No tenderness, No rebound - guarding or rigidity. No Cyanosis, Clubbing or edema, No new Rash or bruise          Data Reviewed: I have personally reviewed following labs and imaging studies  CBC: Recent Labs  Lab 02/22/21 2001 02/23/21 0403 02/24/21 0413 02/25/21 0451  WBC 8.4 7.7 4.8 3.8*  NEUTROABS 7.1  --   --   --   HGB 11.4* 10.5* 9.6* 10.3*  HCT 35.2* 32.2* 30.7* 31.7*  MCV 86.3 85.0 88.0 85.4  PLT 165 136* 138* 122*    Basic Metabolic Panel: Recent Labs  Lab 02/22/21 2001 02/23/21 0403 02/23/21 0627 02/24/21 0413 02/25/21 0451  NA 137 136  --  137 133*  K 3.0* 4.5  --  4.0 4.6  CL 109 109  --  113* 109  CO2 21* 22  --  20* 19*  GLUCOSE 166* 125*  --  72 350*  BUN 36* 30*  --  27* 22  CREATININE 1.52* 1.22*  --  1.23* 1.15*  CALCIUM 8.8* 8.5*  --  8.2* 8.4*  MG  --   --  1.8  --   --     GFR: Estimated Creatinine Clearance: 46.7 mL/min (A) (by C-G formula based on SCr of 1.15 mg/dL (H)).  Liver Function Tests: Recent Labs  Lab 02/22/21 2001  AST 51*  ALT 33  ALKPHOS 129*  BILITOT 1.1  PROT 7.1  ALBUMIN 3.7    CBG: Recent Labs  Lab 02/24/21 1211 02/24/21 1631 02/24/21 2125 02/25/21 0814 02/25/21 1143  GLUCAP 161* 206* 208* 394* 408*     Recent Results (from the past 240 hour(s))  Resp Panel by RT-PCR (Flu A&B, Covid) Nasopharyngeal Swab     Status: None   Collection Time: 02/22/21  8:01 PM   Specimen: Nasopharyngeal Swab; Nasopharyngeal(NP) swabs in vial transport medium  Result Value Ref Range Status   SARS Coronavirus 2 by RT PCR NEGATIVE NEGATIVE Final    Comment: (NOTE) SARS-CoV-2 target nucleic acids are NOT DETECTED.  The SARS-CoV-2 RNA is generally detectable in upper respiratory specimens during the acute phase of infection. The lowest concentration of SARS-CoV-2  viral copies this assay can detect is 138 copies/mL. A negative result does not preclude SARS-Cov-2 infection and should not be used as the sole basis for treatment or other patient management decisions. A negative result may occur with  improper specimen collection/handling, submission of specimen other than nasopharyngeal swab, presence of viral mutation(s) within the areas targeted by this assay, and inadequate number of viral copies(<138 copies/mL). A negative result must be combined with clinical observations, patient history, and epidemiological information. The expected result is Negative.  Fact Sheet for Patients:  EntrepreneurPulse.com.au  Fact Sheet for Healthcare Providers:  IncredibleEmployment.be  This test is no t yet approved or cleared by  the [employer] and  has been authorized for detection and/or diagnosis of SARS-CoV-2 by FDA under an Emergency Use Authorization (EUA). This EUA will remain  in effect (meaning this test can be used) for the duration of the COVID-19 declaration under Section 564(b)(1) of the Act, 21 U.S.C.section 360bbb-3(b)(1), unless the authorization is terminated  or revoked sooner.       Influenza A by PCR NEGATIVE NEGATIVE Final   Influenza B by PCR NEGATIVE NEGATIVE Final    Comment: (NOTE) The Xpert Xpress SARS-CoV-2/FLU/RSV plus assay is intended as an aid in the diagnosis of influenza from Nasopharyngeal swab specimens and should not be used as a sole basis for treatment. Nasal washings and aspirates are unacceptable for Xpert Xpress SARS-CoV-2/FLU/RSV testing.  Fact Sheet for Patients: EntrepreneurPulse.com.au  Fact Sheet for Healthcare Providers: IncredibleEmployment.be  This test is not yet approved or cleared by the [place] FDA and has been authorized for detection and/or diagnosis of SARS-CoV-2 by FDA under an Emergency Use Authorization  (EUA). This EUA will remain in effect (meaning this test can be used) for the duration of the COVID-19 declaration under Section 564(b)(1) of the Act, 21 U.S.C. section 360bbb-3(b)(1), unless the authorization is terminated or revoked.  Performed at [facility], [address], [place], [place] [address]   Urine Culture     Status: Abnormal   Collection Time: 02/23/21  1:06 AM   Specimen: Urine, Random  Result Value Ref Range Status   Specimen Description   Final    URINE, RANDOM Performed at [facility], [address] [place], [place] [address]    Special Requests   Final    NONE Performed at [facility], [address] [place], [place] [address]    Culture 30,000 COLONIES/mL ENTEROCOCCUS FAECALIS (A)  Final   Report Status 02/25/2021 FINAL  Final   Organism ID, Bacteria ENTEROCOCCUS FAECALIS (A)  Final      Susceptibility   Enterococcus faecalis - MIC*    AMPICILLIN <=2 SENSITIVE Sensitive     NITROFURANTOIN <=16 SENSITIVE Sensitive     VANCOMYCIN 1 SENSITIVE Sensitive     * 30,000 COLONIES/mL ENTEROCOCCUS FAECALIS  CULTURE, BLOOD (ROUTINE X 2) w Reflex to ID Panel     Status: None (Preliminary result)   Collection Time: 02/23/21  6:27 AM   Specimen: BLOOD  Result Value Ref Range Status   Specimen Description BLOOD RIGHT HAND  Final   Special Requests   Final    BOTTLES DRAWN AEROBIC AND ANAEROBIC Blood Culture adequate volume   Culture   Final    NO GROWTH 2 DAYS Performed at [facility], [address] [place], [place] [address]    Report Status PENDING  Incomplete  CULTURE, BLOOD (ROUTINE X 2) w Reflex to ID Panel     Status: None (Preliminary result)   Collection Time: 02/23/21  6:38 AM   Specimen: BLOOD  Result Value Ref Range Status   Specimen Description BLOOD RIGHT WRIST  Final   Special Requests   Final    BOTTLES DRAWN AEROBIC ONLY Blood Culture results may not be optimal due to an inadequate volume of blood  received in culture bottles   Culture   Final    NO GROWTH 2 DAYS Performed at [facility], [address], [place], [place] [address]    Report Status PENDING  Incomplete         Radiology Studies: MR BRAIN WO CONTRAST  Result Date: 02/25/2021 CLINICAL DATA:  Delirium EXAM: MRI HEAD WITHOUT CONTRAST TECHNIQUE: Multiplanar, multiecho pulse sequences of the brain and surrounding structures were obtained without intravenous contrast. COMPARISON:  02/12/2021 FINDINGS: Brain: No acute infarct, mass effect or extra-axial collection. No acute or chronic hemorrhage. There is multifocal hyperintense T2-weighted signal within the white matter. Generalized volume loss without a clear lobar predilection. The midline structures are normal. Vascular: Major flow voids are preserved. Skull and upper cervical spine: Normal calvarium and skull base. Visualized upper cervical spine and soft tissues are normal. Sinuses/Orbits:Mild paranasal sinus mucosal disease. Small mastoid effusions. Normal orbits. IMPRESSION: 1. No acute intracranial abnormality. 2. Generalized volume loss and findings of chronic small vessel ischemia. Electronically Signed   By: Ulyses Jarred M.D.   On: 02/25/2021 00:27   DG Chest Port 1 View  Result Date: 02/24/2021 CLINICAL DATA:  Wheezing EXAM: PORTABLE CHEST 1 VIEW COMPARISON:  CT 02/22/2021, radiograph 02/22/2021, 02/12/2021 FINDINGS: Mild central airways thickening. No consolidation or effusion. Mild cardiomegaly. No pneumothorax. Low lung volumes. IMPRESSION: 1. Mild cardiomegaly without edema or sizeable effusion 2. Low lung volumes.  Suspect mild central airways thickening Electronically Signed   By: Donavan Foil M.D.   On: 02/24/2021 17:22        Scheduled Meds:  amLODipine  10 mg Oral Daily   carvedilol  3.125 mg Oral BID WC   docusate sodium  100 mg Oral Daily   enoxaparin (LOVENOX) injection  40 mg Subcutaneous Q24H   insulin aspart  0-9 Units Subcutaneous  TID AC & HS   insulin detemir  5 Units Subcutaneous BID   lactulose  20 g Oral Daily   polyethylene glycol  17 g Oral BID   sertraline  50 mg Oral Daily   vitamin B-12  1,000 mcg Oral Daily   Continuous Infusions:  sodium chloride Stopped (02/24/21 0634)   ampicillin-sulbactam (UNASYN) IV 1.5 g (02/25/21 0829)     LOS: 2 days      Phillips Climes, MD [facility]   To contact the attending provider between 7A-7P or the covering provider during after hours 7P-7A, please log into the web site [email or web address] and access using universal [place] password for that web site. If you do not have the password, please call the hospital operator.  02/25/2021, 12:16 PM

## 2021-02-25 NOTE — Plan of Care (Signed)

## 2021-02-26 DIAGNOSIS — G9340 Encephalopathy, unspecified: Secondary | ICD-10-CM | POA: Diagnosis not present

## 2021-02-26 DIAGNOSIS — R0789 Other chest pain: Secondary | ICD-10-CM

## 2021-02-26 DIAGNOSIS — E162 Hypoglycemia, unspecified: Secondary | ICD-10-CM | POA: Diagnosis not present

## 2021-02-26 LAB — COMPREHENSIVE METABOLIC PANEL
ALT: 34 U/L (ref 0–44)
AST: 40 U/L (ref 15–41)
Albumin: 2.9 g/dL — ABNORMAL LOW (ref 3.5–5.0)
Alkaline Phosphatase: 108 U/L (ref 38–126)
Anion gap: 3 — ABNORMAL LOW (ref 5–15)
BUN: 24 mg/dL — ABNORMAL HIGH (ref 8–23)
CO2: 23 mmol/L (ref 22–32)
Calcium: 8.7 mg/dL — ABNORMAL LOW (ref 8.9–10.3)
Chloride: 111 mmol/L (ref 98–111)
Creatinine, Ser: 1.24 mg/dL — ABNORMAL HIGH (ref 0.44–1.00)
GFR, Estimated: 48 mL/min — ABNORMAL LOW (ref >60)
Glucose, Bld: 313 mg/dL — ABNORMAL HIGH (ref 70–99)
Potassium: 4 mmol/L (ref 3.5–5.1)
Sodium: 137 mmol/L (ref 135–145)
Total Bilirubin: 0.6 mg/dL (ref 0.3–1.2)
Total Protein: 5.5 g/dL — ABNORMAL LOW (ref 6.5–8.1)

## 2021-02-26 LAB — CBC
HCT: 28.3 % — ABNORMAL LOW (ref 36.0–46.0)
Hemoglobin: 9.2 g/dL — ABNORMAL LOW (ref 12.0–15.0)
MCH: 27.6 pg (ref 26.0–34.0)
MCHC: 32.5 g/dL (ref 30.0–36.0)
MCV: 85 fL (ref 80.0–100.0)
Platelets: 106 10*3/uL — ABNORMAL LOW (ref 150–400)
RBC: 3.33 MIL/uL — ABNORMAL LOW (ref 3.87–5.11)
RDW: 16 % — ABNORMAL HIGH (ref 11.5–15.5)
WBC: 4.4 10*3/uL (ref 4.0–10.5)
nRBC: 0 % (ref 0.0–0.2)

## 2021-02-26 LAB — GLUCOSE, CAPILLARY
Glucose-Capillary: 261 mg/dL — ABNORMAL HIGH (ref 70–99)
Glucose-Capillary: 169 mg/dL — ABNORMAL HIGH (ref 70–99)
Glucose-Capillary: 240 mg/dL — ABNORMAL HIGH (ref 70–99)
Glucose-Capillary: 236 mg/dL — ABNORMAL HIGH (ref 70–99)

## 2021-02-26 MED ORDER — INSULIN DETEMIR 100 UNIT/ML ~~LOC~~ SOLN
25.0000 [IU] | Freq: Two times a day (BID) | SUBCUTANEOUS | Status: DC
  Administered 2021-02-26: 25 [IU] via SUBCUTANEOUS
  Filled 2021-02-26 (×2): qty 0.25

## 2021-02-26 MED ORDER — INSULIN DETEMIR 100 UNIT/ML ~~LOC~~ SOLN
30.0000 [IU] | Freq: Two times a day (BID) | SUBCUTANEOUS | Status: DC
  Administered 2021-02-26: 30 [IU] via SUBCUTANEOUS
  Filled 2021-02-26 (×3): qty 0.3

## 2021-02-26 NOTE — Progress Notes (Signed)
PROGRESS NOTE    [name]  WUJ:[id number] DOB: 26-Jun-1956 DOA: 02/22/2021 PCP: [name], NP   Chief Complaint  Patient presents with   Hypoglycemia    Brief Narrative:   [name] is a 65 y.o. Caucasian female with medical history significant for multiple medical problems that are mentioned below, presented to the emergency room with acute onset of altered mental status with confusion and and unresponsiveness where she was found to be hypoglycemic with blood glucose of 38.  She apparently slid from her bed and hit her head, she is with multiple recent hospitalization due to encephalopathy felt to be secondary to multiple etiologies, due to UTI, hyperammonemia, pharmacy, her work-up this admission was significant for UTI.   Assessment & Plan:   Active Problems:   Encephalopathy    Acute metabolic cephalopathy  -Per husband patient is appropriate, awake alert x3 usually -May be related to UTI, hypoglycemia, she had history of hyperammonemia in the past as well. -CT head with no acute findings.  Mri of the brain with no acute intracranial abnormality as well.   -Mental status is improving. -Ammonia within normal level -EEG is pending  Sepsis due to UTI, present on admission  -Present on admission, given tachypnea, tachycardia, altered mental status and elevated lactic acid  -Urine culture growing Enterococcus, changed Rocephin to Unasyn, ID input greatly appreciated.  Liver cirrhosis -With recent diagnosis of hyperammonemia, CT chest last March with evidence of cirrhosis, hepatitis panel is negative, she denies any history of alcohol abuse . -Further work-up as an outpatient. -Continue with lactulose for hyperammonemia.  Chest pain, rule out acute coronary syndrome. - This likely atypical.  She had 2 negative sets of troponin I's.  She is chest pain-free rate currently  Diabetes mellitus, insulin-dependent, with hypoglycemia/hyperglycemia -Patient  initially presents with hypoglycemia at 38, her CBG currently uncontrolled in the 400s range, creasing Levemir to 25 units twice daily , changed to home dose at 30 units twice daily.  Continue with insulin sliding scale, she will not need to be . on NovoLog upon discharge.    Hypokalemia. - replaced    Paroxysmal A Fib -Not on anticoagulation due to frequent falls   CKD stage 3a -Stable  Acute kidney injury on stage IIIa chronic kidney disease. - The patient will be hydrated with IV normal saline and will follow BMP.  Essential hypertension. - We will continue Norvasc and Coreg. -Of chronic hypotension in the past requiring midodrine, but that seems to be acceptable currently.  Vitamin B12 deficiency. - We will continue vitamin B12.  Depression. - We will continue Zoloft.   DVT prophylaxis: Lovenox Code Status: Full Family Communication: D/W husband by phone 6/17, I have tried to call today went to voicemail. Disposition:   Status is: Inpatient  Remains inpatient appropriate because:IV treatments appropriate due to intensity of illness or inability to take PO  Dispo: The patient is from: Home              Anticipated d/c is to: Home              Patient currently is not medically stable to d/c.   Difficult to place patient No       Consultants:  ID   Subjective:  No significant events overnight as discussed with staff, she is complaining of pain all over.  Objective: Vitals:   02/25/21 2005 02/26/21 0052 02/26/21 0426 02/26/21 0724  BP: 108/62 128/78 (!) 127/57 119/67  Pulse:  90 81 83 79  Resp: 18 16 16 14   Temp: 98.6 F (37 C) 97.6 F (36.4 C) 97.7 F (36.5 C) 98 F (36.7 C)  TempSrc: Oral Oral Oral   SpO2: 98% 98% 97% 97%  Weight:      Height:        Intake/Output Summary (Last 24 hours) at 02/26/2021 1111 Last data filed at 02/26/2021 1020 Gross per 24 hour  Intake 1594.35 ml  Output 800 ml  Net 794.35 ml   Filed Weights   02/22/21 1958   Weight: 77.1 kg    Examination:   Awake Alert, Oriented X 2, follow commands, and answering questions appropriately, but she is forgetful. Symmetrical Chest wall movement, Good air movement bilaterally, CTAB RRR,No Gallops,Rubs or new Murmurs, No Parasternal Heave +ve B.Sounds, Abd Soft, No tenderness, No rebound - guarding or rigidity. No Cyanosis, Clubbing or edema, No new Rash or bruise           Data Reviewed: I have personally reviewed following labs and imaging studies  CBC: Recent Labs  Lab 02/22/21 2001 02/23/21 0403 02/24/21 0413 02/25/21 0451 02/26/21 0508  WBC 8.4 7.7 4.8 3.8* 4.4  NEUTROABS 7.1  --   --   --   --   HGB 11.4* 10.5* 9.6* 10.3* 9.2*  HCT 35.2* 32.2* 30.7* 31.7* 28.3*  MCV 86.3 85.0 88.0 85.4 85.0  PLT 165 136* 138* 122* 106*    Basic Metabolic Panel: Recent Labs  Lab 02/22/21 2001 02/23/21 0403 02/23/21 0627 02/24/21 0413 02/25/21 0451 02/25/21 1208 02/26/21 0508  NA 137 136  --  137 133*  --  137  K 3.0* 4.5  --  4.0 4.6  --  4.0  CL 109 109  --  113* 109  --  111  CO2 21* 22  --  20* 19*  --  23  GLUCOSE 166* 125*  --  72 350* 427* 313*  BUN 36* 30*  --  27* 22  --  24*  CREATININE 1.52* 1.22*  --  1.23* 1.15*  --  1.24*  CALCIUM 8.8* 8.5*  --  8.2* 8.4*  --  8.7*  MG  --   --  1.8  --   --   --   --     GFR: Estimated Creatinine Clearance: 43.3 mL/min (A) (by C-G formula based on SCr of 1.24 mg/dL (H)).  Liver Function Tests: Recent Labs  Lab 02/22/21 2001 02/26/21 0508  AST 51* 40  ALT 33 34  ALKPHOS 129* 108  BILITOT 1.1 0.6  PROT 7.1 5.5*  ALBUMIN 3.7 2.9*    CBG: Recent Labs  Lab 02/25/21 0814 02/25/21 1143 02/25/21 1548 02/25/21 2113 02/26/21 0750  GLUCAP 394* 408* 246* 266* 261*     Recent Results (from the past 240 hour(s))  Resp Panel by RT-PCR (Flu A&B, Covid) Nasopharyngeal Swab     Status: None   Collection Time: 02/22/21  8:01 PM   Specimen: Nasopharyngeal Swab; Nasopharyngeal(NP) swabs  in vial transport medium  Result Value Ref Range Status   SARS Coronavirus 2 by RT PCR NEGATIVE NEGATIVE Final    Comment: (NOTE) SARS-CoV-2 target nucleic acids are NOT DETECTED.  The SARS-CoV-2 RNA is generally detectable in upper respiratory specimens during the acute phase of infection. The lowest concentration of SARS-CoV-2 viral copies this assay can detect is 138 copies/mL. A negative result does not preclude SARS-Cov-2 infection and should not be used as the sole basis for treatment or  other patient management decisions. A negative result may occur with  improper specimen collection/handling, submission of specimen other than nasopharyngeal swab, presence of viral mutation(s) within the areas targeted by this assay, and inadequate number of viral copies(<138 copies/mL). A negative result must be combined with clinical observations, patient history, and epidemiological information. The expected result is Negative.  Fact Sheet for Patients:  EntrepreneurPulse.com.au  Fact Sheet for Healthcare Providers:  IncredibleEmployment.be  This test is no t yet approved or cleared by the [place] FDA and  has been authorized for detection and/or diagnosis of SARS-CoV-2 by FDA under an Emergency Use Authorization (EUA). This EUA will remain  in effect (meaning this test can be used) for the duration of the COVID-19 declaration under Section 564(b)(1) of the Act, 21 U.S.C.section 360bbb-3(b)(1), unless the authorization is terminated  or revoked sooner.       Influenza A by PCR NEGATIVE NEGATIVE Final   Influenza B by PCR NEGATIVE NEGATIVE Final    Comment: (NOTE) The Xpert Xpress SARS-CoV-2/FLU/RSV plus assay is intended as an aid in the diagnosis of influenza from Nasopharyngeal swab specimens and should not be used as a sole basis for treatment. Nasal washings and aspirates are unacceptable for Xpert Xpress  SARS-CoV-2/FLU/RSV testing.  Fact Sheet for Patients: EntrepreneurPulse.com.au  Fact Sheet for Healthcare Providers: IncredibleEmployment.be  This test is not yet approved or cleared by the [place] FDA and has been authorized for detection and/or diagnosis of SARS-CoV-2 by FDA under an Emergency Use Authorization (EUA). This EUA will remain in effect (meaning this test can be used) for the duration of the COVID-19 declaration under Section 564(b)(1) of the Act, 21 U.S.C. section 360bbb-3(b)(1), unless the authorization is terminated or revoked.  Performed at [facility], [address] [place], [place] [address]   Urine Culture     Status: Abnormal   Collection Time: 02/23/21  1:06 AM   Specimen: Urine, Random  Result Value Ref Range Status   Specimen Description   Final    URINE, RANDOM Performed at [facility], [address] [place], [place] [address]    Special Requests   Final    NONE Performed at [facility], [address] [place], [place] [address]    Culture 30,000 COLONIES/mL ENTEROCOCCUS FAECALIS (A)  Final   Report Status 02/25/2021 FINAL  Final   Organism ID, Bacteria ENTEROCOCCUS FAECALIS (A)  Final      Susceptibility   Enterococcus faecalis - MIC*    AMPICILLIN <=2 SENSITIVE Sensitive     NITROFURANTOIN <=16 SENSITIVE Sensitive     VANCOMYCIN 1 SENSITIVE Sensitive     * 30,000 COLONIES/mL ENTEROCOCCUS FAECALIS  CULTURE, BLOOD (ROUTINE X 2) w Reflex to ID Panel     Status: None (Preliminary result)   Collection Time: 02/23/21  6:27 AM   Specimen: BLOOD  Result Value Ref Range Status   Specimen Description BLOOD RIGHT HAND  Final   Special Requests   Final    BOTTLES DRAWN AEROBIC AND ANAEROBIC Blood Culture adequate volume   Culture   Final    NO GROWTH 3 DAYS Performed at [facility], [address] [place], [place] [address]    Report Status PENDING  Incomplete   CULTURE, BLOOD (ROUTINE X 2) w Reflex to ID Panel     Status: None (Preliminary result)   Collection Time: 02/23/21  6:38 AM   Specimen: BLOOD  Result Value Ref Range Status   Specimen Description BLOOD  RIGHT WRIST  Final   Special Requests   Final    BOTTLES DRAWN AEROBIC ONLY Blood Culture results may not be optimal due to an inadequate volume of blood received in culture bottles   Culture   Final    NO GROWTH 3 DAYS Performed at [facility], [address], [place], [place] [address]    Report Status PENDING  Incomplete  Urine Culture     Status: Abnormal   Collection Time: 02/24/21  8:56 AM   Specimen: Urine, Random  Result Value Ref Range Status   Specimen Description   Final    URINE, RANDOM Performed at [facility], [address] [place], [place] [address]    Special Requests   Final    NONE Performed at [facility], [address] [place], [place] [address]    Culture MULTIPLE SPECIES PRESENT, SUGGEST RECOLLECTION (A)  Final   Report Status 02/25/2021 FINAL  Final         Radiology Studies: MR BRAIN WO CONTRAST  Result Date: 02/25/2021 CLINICAL DATA:  Delirium EXAM: MRI HEAD WITHOUT CONTRAST TECHNIQUE: Multiplanar, multiecho pulse sequences of the brain and surrounding structures were obtained without intravenous contrast. COMPARISON:  02/12/2021 FINDINGS: Brain: No acute infarct, mass effect or extra-axial collection. No acute or chronic hemorrhage. There is multifocal hyperintense T2-weighted signal within the white matter. Generalized volume loss without a clear lobar predilection. The midline structures are normal. Vascular: Major flow voids are preserved. Skull and upper cervical spine: Normal calvarium and skull base. Visualized upper cervical spine and soft tissues are normal. Sinuses/Orbits:Mild paranasal sinus mucosal disease. Small mastoid effusions. Normal orbits. IMPRESSION: 1. No acute intracranial abnormality. 2.  Generalized volume loss and findings of chronic small vessel ischemia. Electronically Signed   By: Ulyses Jarred M.D.   On: 02/25/2021 00:27   DG Chest Port 1 View  Result Date: 02/24/2021 CLINICAL DATA:  Wheezing EXAM: PORTABLE CHEST 1 VIEW COMPARISON:  CT 02/22/2021, radiograph 02/22/2021, 02/12/2021 FINDINGS: Mild central airways thickening. No consolidation or effusion. Mild cardiomegaly. No pneumothorax. Low lung volumes. IMPRESSION: 1. Mild cardiomegaly without edema or sizeable effusion 2. Low lung volumes.  Suspect mild central airways thickening Electronically Signed   By: Donavan Foil M.D.   On: 02/24/2021 17:22        Scheduled Meds:  amLODipine  10 mg Oral Daily   carvedilol  3.125 mg Oral BID WC   docusate sodium  100 mg Oral Daily   enoxaparin (LOVENOX) injection  40 mg Subcutaneous Q24H   insulin aspart  0-15 Units Subcutaneous TID WC   insulin aspart  0-5 Units Subcutaneous QHS   insulin detemir  25 Units Subcutaneous BID   lactulose  20 g Oral Daily   polyethylene glycol  17 g Oral BID   sertraline  50 mg Oral Daily   vitamin B-12  1,000 mcg Oral Daily   Continuous Infusions:  sodium chloride 75 mL/hr at 02/26/21 0438   ampicillin-sulbactam (UNASYN) IV 1.5 g (02/26/21 0904)     LOS: 3 days      Phillips Climes, MD [facility]   To contact the attending provider between 7A-7P or the covering provider during after hours 7P-7A, please log into the web site [email or web address] and access using universal [place] password for that web site. If you do not have the password, please call the hospital operator.  02/26/2021, 11:11 AM

## 2021-02-27 DIAGNOSIS — A419 Sepsis, unspecified organism: Secondary | ICD-10-CM | POA: Diagnosis not present

## 2021-02-27 DIAGNOSIS — E162 Hypoglycemia, unspecified: Secondary | ICD-10-CM | POA: Diagnosis not present

## 2021-02-27 DIAGNOSIS — G9340 Encephalopathy, unspecified: Secondary | ICD-10-CM | POA: Diagnosis not present

## 2021-02-27 LAB — GLUCOSE, CAPILLARY
Glucose-Capillary: 50 mg/dL — ABNORMAL LOW (ref 70–99)
Glucose-Capillary: 63 mg/dL — ABNORMAL LOW (ref 70–99)
Glucose-Capillary: 82 mg/dL (ref 70–99)
Glucose-Capillary: 135 mg/dL — ABNORMAL HIGH (ref 70–99)

## 2021-02-27 LAB — HEMOGLOBIN A1C
Hgb A1c MFr Bld: 9 % — ABNORMAL HIGH (ref 4.8–5.6)
Mean Plasma Glucose: 212 mg/dL

## 2021-02-27 LAB — HEPATITIS PANEL, ACUTE
HCV Ab: NONREACTIVE
Hep A IgM: NONREACTIVE
Hep B C IgM: NONREACTIVE
Hepatitis B Surface Ag: NONREACTIVE

## 2021-02-27 LAB — MRSA PCR SCREENING: MRSA by PCR: NEGATIVE

## 2021-02-27 LAB — EXPECTORATED SPUTUM ASSESSMENT W GRAM STAIN, RFLX TO RESP C

## 2021-02-27 MED ORDER — AMOXICILLIN-POT CLAVULANATE 500-125 MG PO TABS: 1.0000 | ORAL_TABLET | Freq: Two times a day (BID) | ORAL | 0 refills | Status: AC

## 2021-02-27 MED ORDER — LEVEMIR FLEXTOUCH 100 UNIT/ML ~~LOC~~ SOPN: PEN_INJECTOR | SUBCUTANEOUS | 0 refills | Status: DC

## 2021-02-27 MED ORDER — INSULIN DETEMIR 100 UNIT/ML ~~LOC~~ SOLN
20.0000 [IU] | Freq: Two times a day (BID) | SUBCUTANEOUS | Status: DC
  Administered 2021-02-27: 20 [IU] via SUBCUTANEOUS
  Filled 2021-02-27 (×2): qty 0.2

## 2021-02-27 NOTE — Progress Notes (Signed)
   Date of Admission:  02/22/2021     ID: [name] is a 65 y.o. female  Active Problems:   Encephalopathy    Subjective: Doing well No fever  No confusion No urinary symptoms  Medications:   amLODipine  10 mg Oral Daily   carvedilol  3.125 mg Oral BID WC   docusate sodium  100 mg Oral Daily   enoxaparin (LOVENOX) injection  40 mg Subcutaneous Q24H   insulin aspart  0-15 Units Subcutaneous TID WC   insulin aspart  0-5 Units Subcutaneous QHS   insulin detemir  20 Units Subcutaneous BID   lactulose  20 g Oral Daily   polyethylene glycol  17 g Oral BID   sertraline  50 mg Oral Daily   vitamin B-12  1,000 mcg Oral Daily    Objective: Vital signs in last 24 hours: Temp:  [97.6 F (36.4 C)-98.5 F (36.9 C)] 97.6 F (36.4 C) (06/20 0754) Pulse Rate:  [75-83] 83 (06/20 0754) Resp:  [14-16] 16 (06/20 0754) BP: (115-138)/(57-63) 126/57 (06/20 0754) SpO2:  [97 %-99 %] 97 % (06/20 0754)  PHYSICAL EXAM:  General: Alert, cooperative, no distress, appears stated age.  Lungs: Clear to auscultation bilaterally. No Wheezing or Rhonchi. No rales. Heart: Regular rate and rhythm, no murmur, rub or gallop. Abdomen: Soft, non-tender,not distended. Bowel sounds normal. No masses Extremities: atraumatic, no cyanosis. No edema. No clubbing Skin: No rashes or lesions. Or bruising Lymph: Cervical, supraclavicular normal. Neurologic: Grossly non-focal  Lab Results Recent Labs    02/25/21 0451 02/26/21 0508  WBC 3.8* 4.4  HGB 10.3* 9.2*  HCT 31.7* 28.3*  NA 133* 137  K 4.6 4.0  CL 109 111  CO2 19* 23  BUN 22 24*  CREATININE 1.15* 1.24*   Liver Panel Recent Labs    02/26/21 0508  PROT 5.5*  ALBUMIN 2.9*  AST 40  ALT 34  ALKPHOS 108  BILITOT 0.6    Microbiology: UC- enterococcus Studies/Results: No results found.   Assessment/Plan:  Encephalopathy due to hypoglycemia has resolved completely  Enterococcus UTI on unasyn- can be changed to amox/clav for 3 more  days  Bronchitis- on amox/clav  Cirrhosis of liver with portal hypertension  Paroxysmal afib  CKD   Pt is doing well and is getting discharged

## 2021-02-27 NOTE — TOC Progression Note (Signed)
Transition of Care [facility]) - Progression Note    Patient Details  Name: [name] MRN: [id number] Date of Birth: 06-24-1956  Transition of Care [facility]) CM/SW [place], RN Phone Number: 02/27/2021, 2:00 PM  Clinical Narrative:     [facility] to set up transport to Home, patient signed the waiver and it was faxed to [facility]   The patient will be picked up at the medical mall entrance pick up will be at 224 the driver will call the desk  Driver will be in [place], charge nurse aware    Expected Discharge Plan: [place] Barriers to Discharge: Continued Medical Work up  Expected Discharge Plan and Services Expected Discharge Plan: [place]   Discharge Planning Services: CM Consult   Living arrangements for the past 2 months: Mobile Home Expected Discharge Date: 02/27/21               DME Arranged: N/A                     Social Determinants of Health (SDOH) Interventions    Readmission Risk Interventions Readmission Risk Prevention Plan 10/01/2019 07/18/2019 07/16/2019  Transportation Screening Complete Complete Complete  Medication Review Press photographer) Complete Complete Complete  PCP or Specialist appointment within 3-5 days of discharge Complete Complete -  HRI or Home Care Consult Complete Complete Complete  SW Recovery Care/Counseling Consult Complete Complete -  Palliative Care Screening Not Applicable Not Applicable Not [place] - Not Applicable Patient Refused  Some recent data might be hidden

## 2021-02-27 NOTE — Care Management Important Message (Signed)
Important Message  Patient Details  Name: [name] MRN: [id number] Date of Birth: 13-Oct-1955   Medicare Important Message Given:  Yes     Juliann Pulse A Kunal Levario 02/27/2021, 11:48 AM

## 2021-02-27 NOTE — Discharge Instructions (Signed)
Follow with Primary MD [name], NP in 7 days   Get CBC, CMP, checked  by Primary MD next visit.    Activity: As tolerated with Full fall precautions use walker/cane & assistance as needed   Disposition Home    Diet: Heart Healthy /carb modified , with feeding assistance and aspiration precautions.   On your next visit with your primary care physician please Get Medicines reviewed and adjusted.   Please request your Prim.MD to go over all Hospital Tests and Procedure/Radiological results at the follow up, please get all Hospital records sent to your Prim MD by signing hospital release before you go home.   If you experience worsening of your admission symptoms, develop shortness of breath, life threatening emergency, suicidal or homicidal thoughts you must seek medical attention immediately by calling 911 or calling your MD immediately  if symptoms less severe.  You Must read complete instructions/literature along with all the possible adverse reactions/side effects for all the Medicines you take and that have been prescribed to you. Take any new Medicines after you have completely understood and accpet all the possible adverse reactions/side effects.   Do not drive, operating heavy machinery, perform activities at heights, swimming or participation in water activities or provide baby sitting services if your were admitted for syncope or siezures until you have seen by Primary MD or a Neurologist and advised to do so again.  Do not drive when taking Pain medications.    Do not take more than prescribed Pain, Sleep and Anxiety Medications  Special Instructions: If you have smoked or chewed Tobacco  in the last 2 yrs please stop smoking, stop any regular Alcohol  and or any Recreational drug use.  Wear Seat belts while driving.   Please note  You were cared for by a hospitalist during your hospital stay. If you have any questions about your discharge medications or the  care you received while you were in the hospital after you are discharged, you can call the unit and asked to speak with the hospitalist on call if the hospitalist that took care of you is not available. Once you are discharged, your primary care physician will handle any further medical issues. Please note that NO REFILLS for any discharge medications will be authorized once you are discharged, as it is imperative that you return to your primary care physician (or establish a relationship with a primary care physician if you do not have one) for your aftercare needs so that they can reassess your need for medications and monitor your lab values.

## 2021-02-27 NOTE — Discharge Summary (Signed)
Physician Discharge Summary  [name] XHB:[id number] DOB: 13-Jul-1956 DOA: 02/22/2021  PCP: [name], NP  Admit date: 02/22/2021 Discharge date: 02/27/2021  Admitted From:Home, Disposition: Home   Recommendations for Outpatient Follow-up:  Follow up with PCP in 1weeks Patient will need referral to outpatient GI given diagnosis of liver cirrhosis which appears to be chronic, most likely related to NASH Continue to monitor and adjust her insulin regimen closely, she is having great variability between hypo and hyperglycemia.  Home Health:YES   Discharge Condition:Stable CODE STATUS:FULL Diet recommendation: Heart Healthy / Carb Modified   Brief/Interim Summary:  MUMTAZ LOVINS is a 65 y.o. Caucasian female with medical history significant for multiple medical problems that are mentioned below, presented to the emergency room with acute onset of altered mental status with confusion and and unresponsiveness where she was found to be hypoglycemic with blood glucose of 38.  She apparently slid from her bed and hit her head, she is with multiple recent hospitalization due to encephalopathy felt to be secondary to multiple etiologies, due to UTI, hyperammonemia, pharmacy, her work-up this admission was significant for UTI.   Discharge Diagnoses:  Active Problems:   Encephalopathy     Acute metabolic cephalopathy -Per husband patient is appropriate, awake alert x3 , this morning she is awake alert x3, appropriate. -May be related to UTI, hypoglycemia, she had history of hyperammonemia in the past as well. -CT head with no acute findings.  Mri of the brain with no acute intracranial abnormality as well.   -Mental status back to baseline -Ammonia within normal level   Sepsis due to UTI/bronchitis present on admission -Sepsis present on admission, given tachypnea, tachycardia, altered mental status and elevated lactic acid -Urine culture growing Enterococcus, changed  Rocephin to Unasyn, ID input greatly appreciated.  She is to be discharged on oral Augmentin for total of 7 days treatment, as well work-up significant for bronchitis, no pneumonia, and Augmentin should help with that.  Liver cirrhosis -With recent diagnosis of hyperammonemia, CT chest last March with evidence of cirrhosis, hepatitis panel is negative, she denies any history of alcohol abuse .  Patient reports she was told she had history of NASH, but reports she did not follow with GI as an outpatient regarding that. -Further work-up as an outpatient. -Continue with lactulose for hyperammonemia.  Chest pain, rule out acute coronary syndrome. - This likely atypical.  She had 2 negative sets of troponin I's.  She is chest pain-free rate currently   Diabetes mellitus, insulin-dependent, with hypoglycemia/hyperglycemia -Patient initially presents with hypoglycemia at 38, her CBG is very labile, usually she is with hypoglycemia in a.m., so her regimen has been adjusted, she is on Levemir 30 units twice daily at home, she will be discharged on 25 units daily, and 18 units at bedtime to avoid hypoglycemia in a.m., and this is to be adjusted as needed, I have discontinued her NovoLog sliding scale .  Marland KitchenHypokalemia. - replaced     Paroxysmal A Fib -Not on anticoagulation due to frequent falls   CKD stage 3a -Stable   Acute kidney injury on stage IIIa chronic kidney disease. - The patient will be hydrated with IV normal saline and will follow BMP.  Essential hypertension. - We will continue Norvasc and Coreg. -Of chronic hypotension in the past requiring midodrine, but that seems to be acceptable currently.  Vitamin B12 deficiency. - We will continue vitamin B12.  Depression. - We will continue Zoloft.    Discharge Instructions  Discharge Instructions     Diet - low sodium heart healthy   Complete by: As directed    Diet - low sodium heart healthy   Complete by: As directed     Discharge instructions   Complete by: As directed    Follow with Primary MD [name], NP in 7 days   Get CBC, CMP, checked  by Primary MD next visit.    Activity: As tolerated with Full fall precautions use walker/cane & assistance as needed   Disposition Home    Diet: Heart Healthy /carb modified , with feeding assistance and aspiration precautions.   On your next visit with your primary care physician please Get Medicines reviewed and adjusted.   Please request your Prim.MD to go over all Hospital Tests and Procedure/Radiological results at the follow up, please get all Hospital records sent to your Prim MD by signing hospital release before you go home.   If you experience worsening of your admission symptoms, develop shortness of breath, life threatening emergency, suicidal or homicidal thoughts you must seek medical attention immediately by calling 911 or calling your MD immediately  if symptoms less severe.  You Must read complete instructions/literature along with all the possible adverse reactions/side effects for all the Medicines you take and that have been prescribed to you. Take any new Medicines after you have completely understood and accpet all the possible adverse reactions/side effects.   Do not drive, operating heavy machinery, perform activities at heights, swimming or participation in water activities or provide baby sitting services if your were admitted for syncope or siezures until you have seen by Primary MD or a Neurologist and advised to do so again.  Do not drive when taking Pain medications.    Do not take more than prescribed Pain, Sleep and Anxiety Medications  Special Instructions: If you have smoked or chewed Tobacco  in the last 2 yrs please stop smoking, stop any regular Alcohol  and or any Recreational drug use.  Wear Seat belts while driving.   Please note  You were cared for by a hospitalist during your hospital stay. If you have  any questions about your discharge medications or the care you received while you were in the hospital after you are discharged, you can call the unit and asked to speak with the hospitalist on call if the hospitalist that took care of you is not available. Once you are discharged, your primary care physician will handle any further medical issues. Please note that NO REFILLS for any discharge medications will be authorized once you are discharged, as it is imperative that you return to your primary care physician (or establish a relationship with a primary care physician if you do not have one) for your aftercare needs so that they can reassess your need for medications and monitor your lab values.   Increase activity slowly   Complete by: As directed    Increase activity slowly   Complete by: As directed       Allergies as of 02/27/2021       Reactions   Latex Other (See Comments)   Pt had allergic reaction to tegaderm dressing on IV.  Caused blisters on the skin.   Aspirin Rash   Codeine Sulfate Rash   Erythromycin Rash, Other (See Comments)   Fever, also   Prednisone Swelling   Rosiglitazone Maleate Swelling   Tetanus-diphtheria Toxoids Td Rash, Other (See Comments)   Fever, also  Medication List     STOP taking these medications    doxycycline 100 MG tablet Commonly known as: VIBRA-TABS   insulin lispro 100 UNIT/ML KwikPen Commonly known as: HumaLOG KwikPen       TAKE these medications    amLODipine 10 MG tablet Commonly known as: NORVASC Take 1 tablet (10 mg total) by mouth daily.   amoxicillin-clavulanate 500-125 MG tablet Commonly known as: Augmentin Take 1 tablet (500 mg total) by mouth 2 (two) times daily for 4 days.   carvedilol 3.125 MG tablet Commonly known as: COREG Take 1 tablet (3.125 mg total) by mouth 2 (two) times daily with a meal.   docusate sodium 100 MG capsule Commonly known as: Colace Take 1 capsule (100 mg total) by mouth daily.    Insulin Syringe-Needle U-100 25G X 1" 1 ML Misc For 4 times a day insulin SQ, 1 month supply. Diagnosis E11.65   lactulose 10 GM/15ML solution Commonly known as: CHRONULAC Take 30 mLs (20 g total) by mouth daily. Skip if you have diarrhea.   Levemir FlexTouch 100 UNIT/ML FlexPen Generic drug: insulin detemir Please take 30 units daytime , and 18 units at nighttime. What changed:  how much to take how to take this when to take this additional instructions   Melatonin 10 MG Caps Take 1 tablet by mouth at bedtime as needed (sleep).   polyethylene glycol 17 g packet Commonly known as: MIRALAX / GLYCOLAX Take 17 g by mouth 2 (two) times daily.   sertraline 50 MG tablet Commonly known as: ZOLOFT Take 1 tablet (50 mg total) by mouth daily. For anxiety and depression.   vitamin B-12 1000 MCG tablet Commonly known as: CYANOCOBALAMIN Take 1 tablet (1,000 mcg total) by mouth daily.        Follow-up Information     [name], NP Follow up in 1 week(s).   Specialty: Internal Medicine Contact information: [place] [address] [phone]                Allergies  Allergen Reactions   Latex Other (See Comments)    Pt had allergic reaction to tegaderm dressing on IV.  Caused blisters on the skin.   Aspirin Rash   Codeine Sulfate Rash   Erythromycin Rash and Other (See Comments)    Fever, also   Prednisone Swelling   Rosiglitazone Maleate Swelling   Tetanus-Diphtheria Toxoids Td Rash and Other (See Comments)    Fever, also    Consultations: ID   Procedures/Studies: DG Chest 2 View  Result Date: 02/22/2021 CLINICAL DATA:  Chest pain.  Hypoglycemia.  Unresponsive. EXAM: CHEST - 2 VIEW COMPARISON:  02/12/2021 FINDINGS: Heart size and pulmonary vascularity are normal. Lungs are clear. Calcification of the aorta. Degenerative changes in the spine and shoulders. No pleural effusions. No pneumothorax. Mediastinal contours appear intact.  IMPRESSION: No active cardiopulmonary disease. Electronically Signed   By: Lucienne Capers M.D.   On: 02/22/2021 20:32   CT Head Wo Contrast  Result Date: 02/22/2021 CLINICAL DATA:  Hypoglycemia altered mental status EXAM: CT HEAD WITHOUT CONTRAST TECHNIQUE: Contiguous axial images were obtained from the base of the skull through the vertex without intravenous contrast. COMPARISON:  MRI 02/12/2021, CT brain 02/11/2021 FINDINGS: Brain: No acute territorial infarction, hemorrhage, or intracranial mass. Atrophy and chronic small vessel ischemic changes of the white matter. Stable ventricle size. Vascular: No hyperdense vessels.  Carotid vascular calcification Skull: Normal. Negative for fracture or focal lesion. Sinuses/Orbits: Mucosal  thickening in the sphenoid and ethmoid sinuses. Fluid in the sphenoid sinus. Left greater than right mastoid effusion Other: None IMPRESSION: 1. No CT evidence for acute intracranial abnormality. Atrophy and chronic small vessel ischemic changes of the white matter 2. Sinusitis.  Bilateral mastoid effusions. Electronically Signed   By: Donavan Foil M.D.   On: 02/22/2021 20:39   CT Angio Chest PE W and/or Wo Contrast  Result Date: 02/22/2021 CLINICAL DATA:  Positive D-dimer, chest pain, syncope EXAM: CT ANGIOGRAPHY CHEST WITH CONTRAST TECHNIQUE: Multidetector CT imaging of the chest was performed using the standard protocol during bolus administration of intravenous contrast. Multiplanar CT image reconstructions and MIPs were obtained to evaluate the vascular anatomy. CONTRAST:  31m OMNIPAQUE IOHEXOL 350 MG/ML SOLN COMPARISON:  Radiograph 02/22/2021 FINDINGS: Cardiovascular: Satisfactory opacification the pulmonary arteries to the segmental level. No pulmonary artery filling defects are identified. Central pulmonary arteries are normal caliber. Cardiomegaly. Coronary artery atherosclerosis. Trace pericardial fluid, stable from prior. Slight dilatation of the ascending thoracic  aorta to 4 cm. No acute luminal abnormality of the imaged aorta. No periaortic stranding or hemorrhage. Minimal plaque within the aorta and tortuous the normally branching proximal great vessels. No major venous abnormalities or significant venous reflux. Mediastinum/Nodes: No mediastinal fluid or gas. Normal thyroid gland and thoracic inlet. No acute abnormality of the trachea. Circumferential thickening of the distal thoracic esophagus without free air or fluid. No worrisome mediastinal, hilar or axillary adenopathy. Lungs/Pleura: Atelectatic changes likely accentuated by imaging during exhalation. No focal consolidative opacity. Mild peribronchial thickening. No pneumothorax. No effusion. No concerning pulmonary nodules or masses. Upper Abdomen: Cirrhotic changes of the liver including a nodular hepatic surface contour with caudate left lobe hypertrophy. Splenomegaly similar to prior. Upper abdominal venous collateralization. Mild symmetric bilateral perinephric stranding, a nonspecific finding which may correlate with advanced age or decreased renal function. No acute abnormalities present in the visualized portions of the upper abdomen. Musculoskeletal: No acute osseous abnormality or suspicious osseous lesion. Review of the MIP images confirms the above findings. IMPRESSION: 1. No evidence of pulmonary embolism. 2. Atelectatic changes likely accentuated by imaging during exhalation. 3. Mild peribronchial thickening, could reflect airways inflammation early interstitial edema. 4. Cirrhotic morphology of the liver with nodular surface contour, hepatosplenomegaly and upper abdominal collateralization. 5. Circumferential thickening of the distal thoracic esophagus, could correlate for features of esophagitis. No CT evidence of perforation. 6. Cardiomegaly.  Coronary artery atherosclerosis. 7.  Aortic Atherosclerosis (ICD10-I70.0). 8. Ascending thoracic aortic dilatation to 4 cm. Recommend annual imaging followup  by CTA or MRA. This recommendation follows 2010 ACCF/AHA/AATS/ACR/ASA/SCA/SCAI/SIR/STS/SVM Guidelines for the Diagnosis and Management of Patients with Thoracic Aortic Disease. Circulation. 2010; 121:: O277-A128 Aortic aneurysm NOS (ICD10-I71.9). Electronically Signed   By: PLovena LeM.D.   On: 02/22/2021 23:37   MR BRAIN WO CONTRAST  Result Date: 02/25/2021 CLINICAL DATA:  Delirium EXAM: MRI HEAD WITHOUT CONTRAST TECHNIQUE: Multiplanar, multiecho pulse sequences of the brain and surrounding structures were obtained without intravenous contrast. COMPARISON:  02/12/2021 FINDINGS: Brain: No acute infarct, mass effect or extra-axial collection. No acute or chronic hemorrhage. There is multifocal hyperintense T2-weighted signal within the white matter. Generalized volume loss without a clear lobar predilection. The midline structures are normal. Vascular: Major flow voids are preserved. Skull and upper cervical spine: Normal calvarium and skull base. Visualized upper cervical spine and soft tissues are normal. Sinuses/Orbits:Mild paranasal sinus mucosal disease. Small mastoid effusions. Normal orbits. IMPRESSION: 1. No acute intracranial abnormality. 2. Generalized volume loss and findings of  chronic small vessel ischemia. Electronically Signed   By: Ulyses Jarred M.D.   On: 02/25/2021 00:27   MR BRAIN WO CONTRAST  Result Date: 02/12/2021 CLINICAL DATA:  Altered mental status. Unresponsive with gaze deviation. EXAM: MRI HEAD WITHOUT CONTRAST TECHNIQUE: Multiplanar, multiecho pulse sequences of the brain and surrounding structures were obtained without intravenous contrast. COMPARISON:  Head CT yesterday. FINDINGS: Brain: Diffusion imaging does not show any acute or subacute infarction. Chronic small-vessel ischemic changes are present within the pons. Few old small vessel cerebellar infarctions on the right. Cerebral hemispheres show mild chronic small-vessel ischemic changes of the white matter. No large  vessel territory infarction. No mass lesion, hemorrhage, hydrocephalus or extra-axial collection. Vascular: Major vessels at the base of the brain show flow. Skull and upper cervical spine: Negative Sinuses/Orbits: Paranasal sinuses show some opacification in the right ethmoid region. Orbits are negative. Other: Bilateral mastoid effusions, left more extensive than right IMPRESSION: No acute or subacute infarction. Chronic small-vessel ischemic changes of the pons and cerebral hemispheric white matter. Right ethmoid sinus opacification. Mastoid effusions, left more extensive than right. Electronically Signed   By: Nelson Chimes M.D.   On: 02/12/2021 17:41   DG Chest Port 1 View  Result Date: 02/24/2021 CLINICAL DATA:  Wheezing EXAM: PORTABLE CHEST 1 VIEW COMPARISON:  CT 02/22/2021, radiograph 02/22/2021, 02/12/2021 FINDINGS: Mild central airways thickening. No consolidation or effusion. Mild cardiomegaly. No pneumothorax. Low lung volumes. IMPRESSION: 1. Mild cardiomegaly without edema or sizeable effusion 2. Low lung volumes.  Suspect mild central airways thickening Electronically Signed   By: Donavan Foil M.D.   On: 02/24/2021 17:22   DG Chest Port 1 View  Result Date: 02/12/2021 CLINICAL DATA:  Shortness of breath EXAM: PORTABLE CHEST 1 VIEW COMPARISON:  Portable exam 1442 hours compared to 02/11/2021 FINDINGS: Enlargement of cardiac silhouette. Mediastinal contours and pulmonary vascularity normal. Peribronchial thickening with minimal LEFT basilar atelectasis. Remaining lungs clear. No acute infiltrate, pleural effusion or pneumothorax. Surgical clips RIGHT upper quadrant question cholecystectomy. No acute osseous findings. IMPRESSION: Bronchitic changes with minimal LEFT basilar atelectasis. Enlargement of cardiac silhouette. Electronically Signed   By: Lavonia Dana M.D.   On: 02/12/2021 14:59   DG Chest Port 1 View  Result Date: 02/11/2021 CLINICAL DATA:  Hypoxia. EXAM: PORTABLE CHEST 1 VIEW  COMPARISON:  Chest x-ray 01/25/2021, chest x-ray 12/12/2020, CT chest 07/24/2019 FINDINGS: The heart size and mediastinal contours are unchanged. Aortic calcification. Patchy left lower lobe opacities. Similar appearing coarsened interstitial markings with no overt pulmonary edema. Trace left pleural effusion not excluded. No pneumothorax. No acute osseous abnormality. IMPRESSION: Patchy left lower lobe opacities and possible trace left pleural effusion. Finding may represent atelectasis versus infection/inflammation. Consider PA and lateral view of the chest for further evaluation. Electronically Signed   By: Iven Finn M.D.   On: 02/11/2021 02:55   EEG adult  Result Date: 02/12/2021 Lora Havens, MD     02/12/2021  1:52 PM Patient Name: SKYLAH DELAUTER MRN: [id number] Epilepsy Attending: Lora Havens Referring Physician/Provider: Dr Harrold Donath Date: 02/12/2021 Duration: 23.50 mins Patient history: 65 year old female presenting to the ED after being found in an awake unresponsive state at home. EEG to evaluate for seizure. Level of alertness: Awake AEDs during EEG study: None Technical aspects: This EEG study was done with scalp electrodes positioned according to the 10-20 International system of electrode placement. Electrical activity was acquired at a sampling rate of 500Hz  and reviewed with a high frequency  filter of 70Hz  and a low frequency filter of 1Hz . EEG data were recorded continuously and digitally stored. Description: The posterior dominant rhythm consists of 7 Hz activity of moderate voltage (25-35 uV) seen predominantly in posterior head regions, symmetric and reactive to eye opening and eye closing. EEG showed continuous generalized 5 to 6 Hz theta as well as intermittent 2-3hz  delta slowing. Generalized periodic discharges with triphasic morphology at  1 Hz were also noted. Hyperventilation and photic stimulation were not performed.   ABNORMALITY - Periodic discharges with  triphasic morphology, generalized ( GPDs) - Continuous slow, generalized IMPRESSION: This study is suggestive of moderate diffuse encephalopathy, nonspecific etiology but could be secondary to toxic-metabolic causes. No seizures or definite epileptiform discharges were seen throughout the recording. Lora Havens   CT HEAD CODE STROKE WO CONTRAST  Result Date: 02/11/2021 CLINICAL DATA:  Code stroke.  Acute neurologic deficit EXAM: CT HEAD WITHOUT CONTRAST TECHNIQUE: Contiguous axial images were obtained from the base of the skull through the vertex without intravenous contrast. COMPARISON:  12/12/2020 FINDINGS: Brain: There is no mass, hemorrhage or extra-axial collection. The size and configuration of the ventricles and extra-axial CSF spaces are normal. There is hypoattenuation of the periventricular white matter, most commonly indicating chronic ischemic microangiopathy. Vascular: No abnormal hyperdensity of the major intracranial arteries or dural venous sinuses. No intracranial atherosclerosis. Skull: The visualized skull base, calvarium and extracranial soft tissues are normal. Sinuses/Orbits: No fluid levels or advanced mucosal thickening of the visualized paranasal sinuses. No mastoid or middle ear effusion. The orbits are normal. ASPECTS [facility] Stroke Program Early CT Score) - Ganglionic level infarction (caudate, lentiform nuclei, internal capsule, insula, M1-M3 cortex): 7 - Supraganglionic infarction (M4-M6 cortex): 3 Total score (0-10 with 10 being normal): 10 IMPRESSION: 1. Chronic ischemic microangiopathy without acute intracranial abnormality. 2. ASPECTS is 10. 3. These results were communicated to Dr. Kerney Elbe at 2:22 am on 02/11/2021 by text page via the [facility] messaging system. Electronically Signed   By: Ulyses Jarred M.D.   On: 02/11/2021 02:23      Subjective:  Patient denies any cough, dysuria, chest pain, shortness of breath, reports she is feeling better today and excited about  going home.   Discharge Exam: Vitals:   02/27/21 0521 02/27/21 0754  BP: (!) 138/58 (!) 126/57  Pulse: 79 83  Resp: 16 16  Temp: 98.1 F (36.7 C) 97.6 F (36.4 C)  SpO2: 99% 97%   Vitals:   02/26/21 1606 02/26/21 2008 02/27/21 0521 02/27/21 0754  BP: 115/63 (!) 128/59 (!) 138/58 (!) 126/57  Pulse: 80 75 79 83  Resp: 14 15 16 16   Temp: 98.5 F (36.9 C) 97.8 F (36.6 C) 98.1 F (36.7 C) 97.6 F (36.4 C)  TempSrc:  Oral Oral Oral  SpO2: 99% 99% 99% 97%  Weight:      Height:        General: Pt is alert, awake, oriented x3, not in acute distress, she is appropriate, coherent and answering questions appropriately, Cardiovascular: RRR, S1/S2 +, no rubs, no gallops Respiratory: CTA bilaterally, no wheezing, no rhonchi Abdominal: Soft, NT, ND, bowel sounds + Extremities: no edema, no cyanosis    The results of significant diagnostics from this hospitalization (including imaging, microbiology, ancillary and laboratory) are listed below for reference.     Microbiology: Recent Results (from the past 240 hour(s))  Resp Panel by RT-PCR (Flu A&B, Covid) Nasopharyngeal Swab     Status: None   Collection Time: 02/22/21  8:01 PM   Specimen: Nasopharyngeal Swab; Nasopharyngeal(NP) swabs in vial transport medium  Result Value Ref Range Status   SARS Coronavirus 2 by RT PCR NEGATIVE NEGATIVE Final    Comment: (NOTE) SARS-CoV-2 target nucleic acids are NOT DETECTED.  The SARS-CoV-2 RNA is generally detectable in upper respiratory specimens during the acute phase of infection. The lowest concentration of SARS-CoV-2 viral copies this assay can detect is 138 copies/mL. A negative result does not preclude SARS-Cov-2 infection and should not be used as the sole basis for treatment or other patient management decisions. A negative result may occur with  improper specimen collection/handling, submission of specimen other than nasopharyngeal swab, presence of viral mutation(s) within  the areas targeted by this assay, and inadequate number of viral copies(<138 copies/mL). A negative result must be combined with clinical observations, patient history, and epidemiological information. The expected result is Negative.  Fact Sheet for Patients:  EntrepreneurPulse.com.au  Fact Sheet for Healthcare Providers:  IncredibleEmployment.be  This test is no t yet approved or cleared by the [place] FDA and  has been authorized for detection and/or diagnosis of SARS-CoV-2 by FDA under an Emergency Use Authorization (EUA). This EUA will remain  in effect (meaning this test can be used) for the duration of the COVID-19 declaration under Section 564(b)(1) of the Act, 21 U.S.C.section 360bbb-3(b)(1), unless the authorization is terminated  or revoked sooner.       Influenza A by PCR NEGATIVE NEGATIVE Final   Influenza B by PCR NEGATIVE NEGATIVE Final    Comment: (NOTE) The Xpert Xpress SARS-CoV-2/FLU/RSV plus assay is intended as an aid in the diagnosis of influenza from Nasopharyngeal swab specimens and should not be used as a sole basis for treatment. Nasal washings and aspirates are unacceptable for Xpert Xpress SARS-CoV-2/FLU/RSV testing.  Fact Sheet for Patients: EntrepreneurPulse.com.au  Fact Sheet for Healthcare Providers: IncredibleEmployment.be  This test is not yet approved or cleared by the [place] FDA and has been authorized for detection and/or diagnosis of SARS-CoV-2 by FDA under an Emergency Use Authorization (EUA). This EUA will remain in effect (meaning this test can be used) for the duration of the COVID-19 declaration under Section 564(b)(1) of the Act, 21 U.S.C. section 360bbb-3(b)(1), unless the authorization is terminated or revoked.  Performed at [facility], [address], [place], [place] [address]   Urine Culture     Status: Abnormal   Collection  Time: 02/23/21  1:06 AM   Specimen: Urine, Random  Result Value Ref Range Status   Specimen Description   Final    URINE, RANDOM Performed at [facility], [address] [place], [place] [address]    Special Requests   Final    NONE Performed at [facility], [place], [place] [address]    Culture 30,000 COLONIES/mL ENTEROCOCCUS FAECALIS (A)  Final   Report Status 02/25/2021 FINAL  Final   Organism ID, Bacteria ENTEROCOCCUS FAECALIS (A)  Final      Susceptibility   Enterococcus faecalis - MIC*    AMPICILLIN <=2 SENSITIVE Sensitive     NITROFURANTOIN <=16 SENSITIVE Sensitive     VANCOMYCIN 1 SENSITIVE Sensitive     * 30,000 COLONIES/mL ENTEROCOCCUS FAECALIS  CULTURE, BLOOD (ROUTINE X 2) w Reflex to ID Panel     Status: None (Preliminary result)   Collection Time: 02/23/21  6:27 AM   Specimen: BLOOD  Result Value Ref Range Status   Specimen Description BLOOD RIGHT HAND  Final  Special Requests   Final    BOTTLES DRAWN AEROBIC AND ANAEROBIC Blood Culture adequate volume   Culture   Final    NO GROWTH 3 DAYS Performed at [facility], [address] [place], [place] [address]    Report Status PENDING  Incomplete  CULTURE, BLOOD (ROUTINE X 2) w Reflex to ID Panel     Status: None (Preliminary result)   Collection Time: 02/23/21  6:38 AM   Specimen: BLOOD  Result Value Ref Range Status   Specimen Description BLOOD RIGHT WRIST  Final   Special Requests   Final    BOTTLES DRAWN AEROBIC ONLY Blood Culture results may not be optimal due to an inadequate volume of blood received in culture bottles   Culture   Final    NO GROWTH 3 DAYS Performed at [facility], [address], [place], [place] [address]    Report Status PENDING  Incomplete  Urine Culture     Status: Abnormal   Collection Time: 02/24/21  8:56 AM   Specimen: Urine, Random  Result Value Ref Range Status   Specimen Description   Final    URINE,  RANDOM Performed at [facility], [address] [place], [place] [address]    Special Requests   Final    NONE Performed at [facility], [address] [place], [place] [address]    Culture MULTIPLE SPECIES PRESENT, SUGGEST RECOLLECTION (A)  Final   Report Status 02/25/2021 FINAL  Final  MRSA PCR Screening     Status: None   Collection Time: 02/27/21  9:41 AM  Result Value Ref Range Status   MRSA by PCR NEGATIVE NEGATIVE Final    Comment:        The GeneXpert MRSA Assay (FDA approved for NASAL specimens only), is one component of a comprehensive MRSA colonization surveillance program. It is not intended to diagnose MRSA infection nor to guide or monitor treatment for MRSA infections. Performed at [facility], [address] [place], [place] [address]      Labs: BNP (last 3 results) Recent Labs    02/14/21 0243 02/15/21 0423 02/16/21 0209  BNP 68.6 56.6 04.8   Basic Metabolic Panel: Recent Labs  Lab 02/22/21 2001 02/23/21 0403 02/23/21 0627 02/24/21 0413 02/25/21 0451 02/25/21 1208 02/26/21 0508  NA 137 136  --  137 133*  --  137  K 3.0* 4.5  --  4.0 4.6  --  4.0  CL 109 109  --  113* 109  --  111  CO2 21* 22  --  20* 19*  --  23  GLUCOSE 166* 125*  --  72 350* 427* 313*  BUN 36* 30*  --  27* 22  --  24*  CREATININE 1.52* 1.22*  --  1.23* 1.15*  --  1.24*  CALCIUM 8.8* 8.5*  --  8.2* 8.4*  --  8.7*  MG  --   --  1.8  --   --   --   --    Liver Function Tests: Recent Labs  Lab 02/22/21 2001 02/26/21 0508  AST 51* 40  ALT 33 34  ALKPHOS 129* 108  BILITOT 1.1 0.6  PROT 7.1 5.5*  ALBUMIN 3.7 2.9*   No results for input(s): LIPASE, AMYLASE in the last 168 hours. Recent Labs  Lab 02/23/21 0106 02/25/21 0451  AMMONIA 9 27   CBC: Recent Labs  Lab 02/22/21 2001 02/23/21 0403 02/24/21 0413 02/25/21 0451 02/26/21  0508  WBC 8.4 7.7 4.8 3.8* 4.4  NEUTROABS 7.1  --   --   --   --   HGB 11.4* 10.5* 9.6* 10.3*  9.2*  HCT 35.2* 32.2* 30.7* 31.7* 28.3*  MCV 86.3 85.0 88.0 85.4 85.0  PLT 165 136* 138* 122* 106*   Cardiac Enzymes: Recent Labs  Lab 02/22/21 2345  CKTOTAL 42   BNP: Invalid input(s): POCBNP CBG: Recent Labs  Lab 02/26/21 1753 02/26/21 2047 02/27/21 0741 02/27/21 0800 02/27/21 0820  GLUCAP 240* 236* 50* 63* 82   D-Dimer No results for input(s): DDIMER in the last 72 hours. Hgb A1c No results for input(s): HGBA1C in the last 72 hours. Lipid Profile No results for input(s): CHOL, HDL, LDLCALC, TRIG, CHOLHDL, LDLDIRECT in the last 72 hours. Thyroid function studies No results for input(s): TSH, T4TOTAL, T3FREE, THYROIDAB in the last 72 hours.  Invalid input(s): FREET3 Anemia work up No results for input(s): VITAMINB12, FOLATE, FERRITIN, TIBC, IRON, RETICCTPCT in the last 72 hours. Urinalysis    Component Value Date/Time   COLORURINE YELLOW (A) 02/23/2021 0106   APPEARANCEUR HAZY (A) 02/23/2021 0106   APPEARANCEUR Cloudy (A) 12/02/2019 1259   LABSPEC 1.033 (H) 02/23/2021 0106   LABSPEC 1.033 10/08/2014 0144   PHURINE 5.0 02/23/2021 0106   GLUCOSEU 150 (A) 02/23/2021 0106   GLUCOSEU >=1000 (A) 11/01/2020 1322   HGBUR LARGE (A) 02/23/2021 0106   BILIRUBINUR NEGATIVE 02/23/2021 0106   BILIRUBINUR Negative 12/02/2019 1259   BILIRUBINUR Negative 10/08/2014 0144   KETONESUR 5 (A) 02/23/2021 0106   PROTEINUR 30 (A) 02/23/2021 0106   UROBILINOGEN 1.0 11/01/2020 1322   NITRITE NEGATIVE 02/23/2021 0106   LEUKOCYTESUR LARGE (A) 02/23/2021 0106   LEUKOCYTESUR Negative 10/08/2014 0144   Sepsis Labs Invalid input(s): PROCALCITONIN,  WBC,  LACTICIDVEN Microbiology Recent Results (from the past 240 hour(s))  Resp Panel by RT-PCR (Flu A&B, Covid) Nasopharyngeal Swab     Status: None   Collection Time: 02/22/21  8:01 PM   Specimen: Nasopharyngeal Swab; Nasopharyngeal(NP) swabs in vial transport medium  Result Value Ref Range Status   SARS Coronavirus 2 by RT PCR NEGATIVE  NEGATIVE Final    Comment: (NOTE) SARS-CoV-2 target nucleic acids are NOT DETECTED.  The SARS-CoV-2 RNA is generally detectable in upper respiratory specimens during the acute phase of infection. The lowest concentration of SARS-CoV-2 viral copies this assay can detect is 138 copies/mL. A negative result does not preclude SARS-Cov-2 infection and should not be used as the sole basis for treatment or other patient management decisions. A negative result may occur with  improper specimen collection/handling, submission of specimen other than nasopharyngeal swab, presence of viral mutation(s) within the areas targeted by this assay, and inadequate number of viral copies(<138 copies/mL). A negative result must be combined with clinical observations, patient history, and epidemiological information. The expected result is Negative.  Fact Sheet for Patients:  EntrepreneurPulse.com.au  Fact Sheet for Healthcare Providers:  IncredibleEmployment.be  This test is no t yet approved or cleared by the [place] FDA and  has been authorized for detection and/or diagnosis of SARS-CoV-2 by FDA under an Emergency Use Authorization (EUA). This EUA will remain  in effect (meaning this test can be used) for the duration of the COVID-19 declaration under Section 564(b)(1) of the Act, 21 U.S.C.section 360bbb-3(b)(1), unless the authorization is terminated  or revoked sooner.       Influenza A by PCR NEGATIVE NEGATIVE Final   Influenza B by PCR NEGATIVE NEGATIVE Final  Comment: (NOTE) The Xpert Xpress SARS-CoV-2/FLU/RSV plus assay is intended as an aid in the diagnosis of influenza from Nasopharyngeal swab specimens and should not be used as a sole basis for treatment. Nasal washings and aspirates are unacceptable for Xpert Xpress SARS-CoV-2/FLU/RSV testing.  Fact Sheet for Patients: EntrepreneurPulse.com.au  Fact Sheet for Healthcare  Providers: IncredibleEmployment.be  This test is not yet approved or cleared by the [place] FDA and has been authorized for detection and/or diagnosis of SARS-CoV-2 by FDA under an Emergency Use Authorization (EUA). This EUA will remain in effect (meaning this test can be used) for the duration of the COVID-19 declaration under Section 564(b)(1) of the Act, 21 U.S.C. section 360bbb-3(b)(1), unless the authorization is terminated or revoked.  Performed at [facility], [address] [place], [place] [address]   Urine Culture     Status: Abnormal   Collection Time: 02/23/21  1:06 AM   Specimen: Urine, Random  Result Value Ref Range Status   Specimen Description   Final    URINE, RANDOM Performed at [facility], [address] [place], [place] [address]    Special Requests   Final    NONE Performed at [facility], [address] [place], [place] [address]    Culture 30,000 COLONIES/mL ENTEROCOCCUS FAECALIS (A)  Final   Report Status 02/25/2021 FINAL  Final   Organism ID, Bacteria ENTEROCOCCUS FAECALIS (A)  Final      Susceptibility   Enterococcus faecalis - MIC*    AMPICILLIN <=2 SENSITIVE Sensitive     NITROFURANTOIN <=16 SENSITIVE Sensitive     VANCOMYCIN 1 SENSITIVE Sensitive     * 30,000 COLONIES/mL ENTEROCOCCUS FAECALIS  CULTURE, BLOOD (ROUTINE X 2) w Reflex to ID Panel     Status: None (Preliminary result)   Collection Time: 02/23/21  6:27 AM   Specimen: BLOOD  Result Value Ref Range Status   Specimen Description BLOOD RIGHT HAND  Final   Special Requests   Final    BOTTLES DRAWN AEROBIC AND ANAEROBIC Blood Culture adequate volume   Culture   Final    NO GROWTH 3 DAYS Performed at [facility], [address], [place], [place] [address]    Report Status PENDING  Incomplete  CULTURE, BLOOD (ROUTINE X 2) w Reflex to ID Panel     Status: None (Preliminary result)   Collection Time: 02/23/21  6:38 AM    Specimen: BLOOD  Result Value Ref Range Status   Specimen Description BLOOD RIGHT WRIST  Final   Special Requests   Final    BOTTLES DRAWN AEROBIC ONLY Blood Culture results may not be optimal due to an inadequate volume of blood received in culture bottles   Culture   Final    NO GROWTH 3 DAYS Performed at [facility], [address], [place], [place] [address]    Report Status PENDING  Incomplete  Urine Culture     Status: Abnormal   Collection Time: 02/24/21  8:56 AM   Specimen: Urine, Random  Result Value Ref Range Status   Specimen Description   Final    URINE, RANDOM Performed at [facility], [address] [place], [place] [address]    Special Requests   Final    NONE Performed at [facility], [address] [place], [place] [address]    Culture MULTIPLE SPECIES PRESENT, SUGGEST RECOLLECTION (A)  Final   Report Status 02/25/2021 FINAL  Final  MRSA PCR Screening  Status: None   Collection Time: 02/27/21  9:41 AM  Result Value Ref Range Status   MRSA by PCR NEGATIVE NEGATIVE Final    Comment:        The GeneXpert MRSA Assay (FDA approved for NASAL specimens only), is one component of a comprehensive MRSA colonization surveillance program. It is not intended to diagnose MRSA infection nor to guide or monitor treatment for MRSA infections. Performed at [facility], [address], [place], [place] [address]      Time coordinating discharge: Over 30 minutes  SIGNED:   Phillips Climes, MD  [facility] 02/27/2021, 11:25 AM Pager   If 7PM-7AM, please contact night-coverage [email or web address] Password [email or web address]

## 2021-02-27 NOTE — TOC Progression Note (Addendum)
Transition of Care [facility]) - Progression Note    Patient Details  Name: [name] MRN: [id number] Date of Birth: Feb 01, 1956  Transition of Care [facility]) CM/SW [place], RN Phone Number: 02/27/2021, 10:21 AM  Clinical Narrative:     The patient is to DC today to home with [facility] the [facility] agency that was set up at the last , attempted to reach the patient's husband, left a VM for a call back, the patient will need transport to home. I spoke with the patient in the room and she is alert and oriented, she stated that she can get into an uber with no problem, she has DME at home, she signed the rider waiver and I faxed it to [phone]  Expected Discharge Plan: [place] Barriers to Discharge: Continued Medical Work up  Expected Discharge Plan and Services Expected Discharge Plan: [place]   Discharge Planning Services: CM Consult   Living arrangements for the past 2 months: Mobile Home                 DME Arranged: N/A                     Social Determinants of Health (SDOH) Interventions    Readmission Risk Interventions Readmission Risk Prevention Plan 10/01/2019 07/18/2019 07/16/2019  Transportation Screening Complete Complete Complete  Medication Review Press photographer) Complete Complete Complete  PCP or Specialist appointment within 3-5 days of discharge Complete Complete -  HRI or Home Care Consult Complete Complete Complete  SW Recovery Care/Counseling Consult Complete Complete -  Palliative Care Screening Not Applicable Not Applicable Not [place] - Not Applicable Patient Refused  Some recent data might be hidden

## 2021-02-28 LAB — CULTURE, BLOOD (ROUTINE X 2)
Culture: NO GROWTH
Culture: NO GROWTH
Special Requests: ADEQUATE

## 2021-03-01 DIAGNOSIS — E162 Hypoglycemia, unspecified: Secondary | ICD-10-CM | POA: Diagnosis not present

## 2021-03-01 DIAGNOSIS — I4891 Unspecified atrial fibrillation: Secondary | ICD-10-CM | POA: Diagnosis not present

## 2021-03-03 ENCOUNTER — Inpatient Hospital Stay: Payer: Medicare Other | Admitting: Primary Care

## 2021-03-03 IMAGING — DX LEFT TIBIA AND FIBULA - 2 VIEW
1 series · 1 of 1 positions shown · non-contrast
Comparison: None.

CLINICAL DATA: Fall several weeks ago with persistent left leg
pain, initial encounter

EXAM:
LEFT TIBIA AND FIBULA - 2 VIEW

[tibia lat]
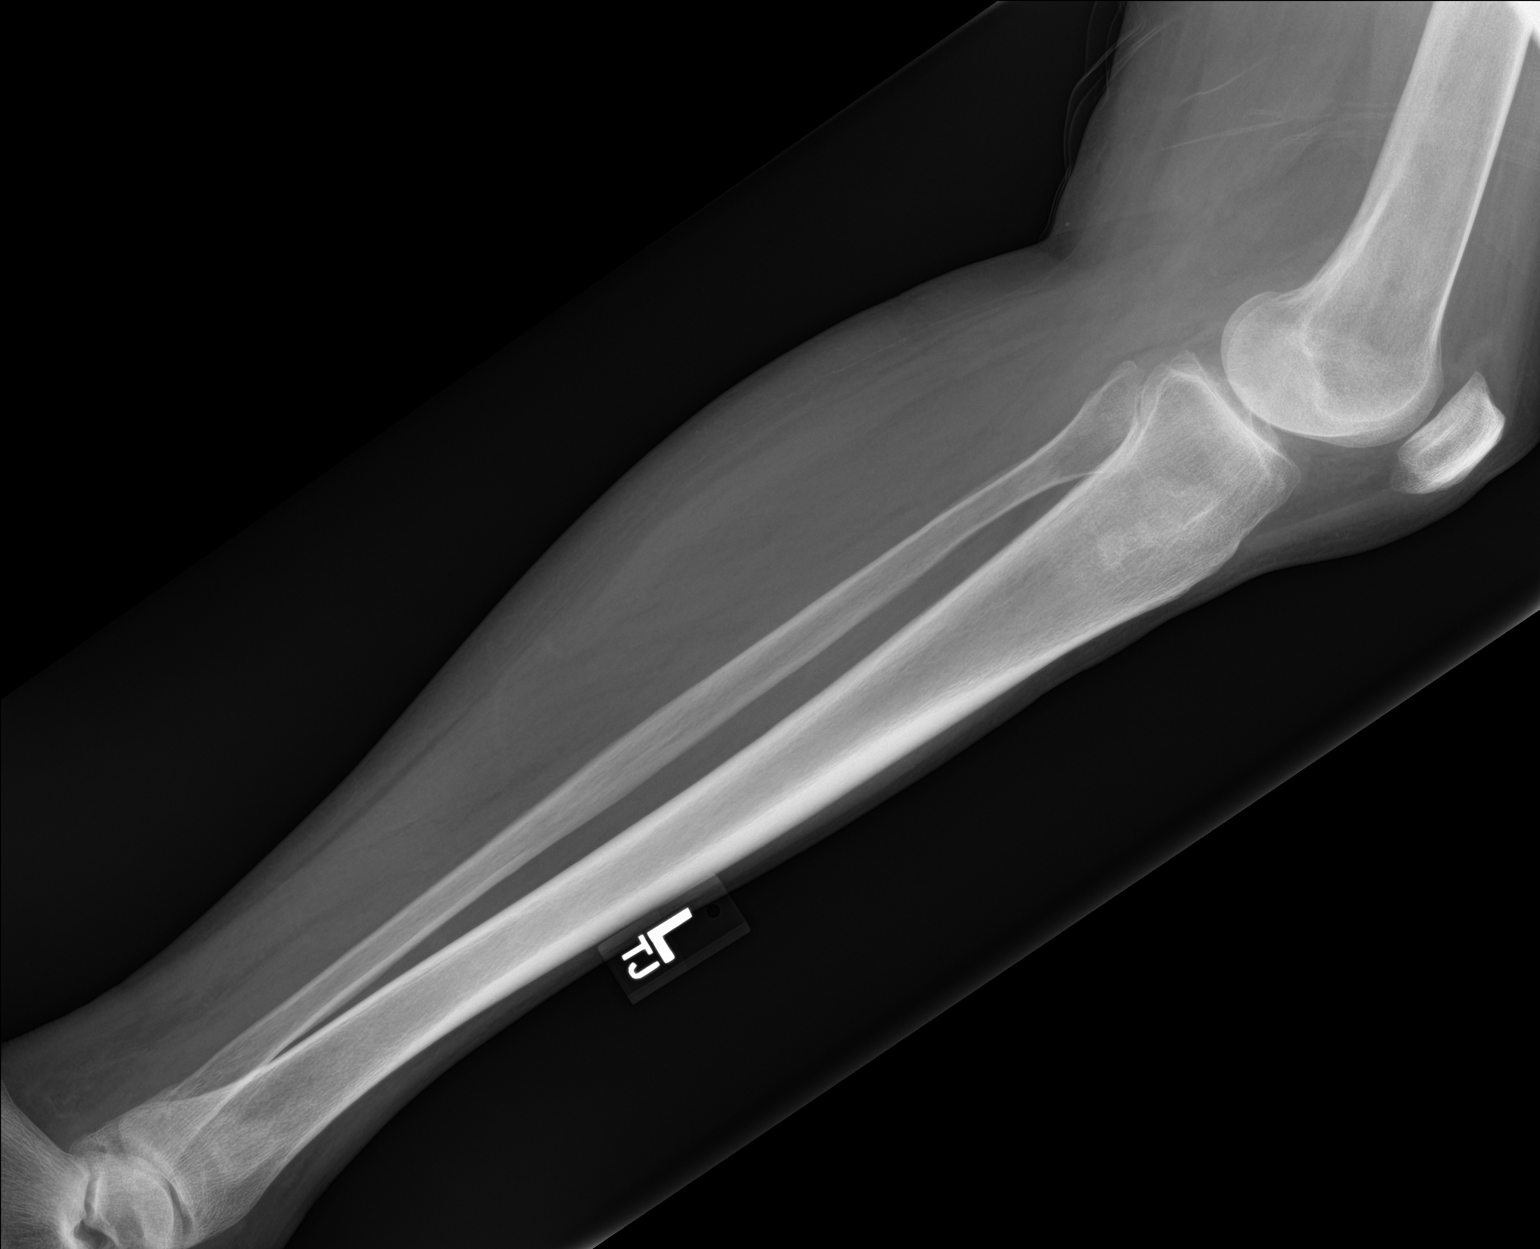

[1 of 1 positions shown; findings below may reference images not displayed]

FINDINGS: Undisplaced distal fibular fracture is noted with associated soft
tissue swelling. This is better visualized on the dedicated ankle
films.
IMPRESSION: Distal fibular fracture without significant displacement. Soft
tissue swelling is noted.

## 2021-03-03 IMAGING — DX LEFT ANKLE COMPLETE - 3+ VIEW
4 series · 4 of 4 positions shown · non-contrast
Comparison: None.

CLINICAL DATA: Fall several weeks ago with persistent ankle pain,
initial encounter

EXAM:
LEFT ANKLE COMPLETE - 3+ VIEW

[ankle ap (1 of 2)]
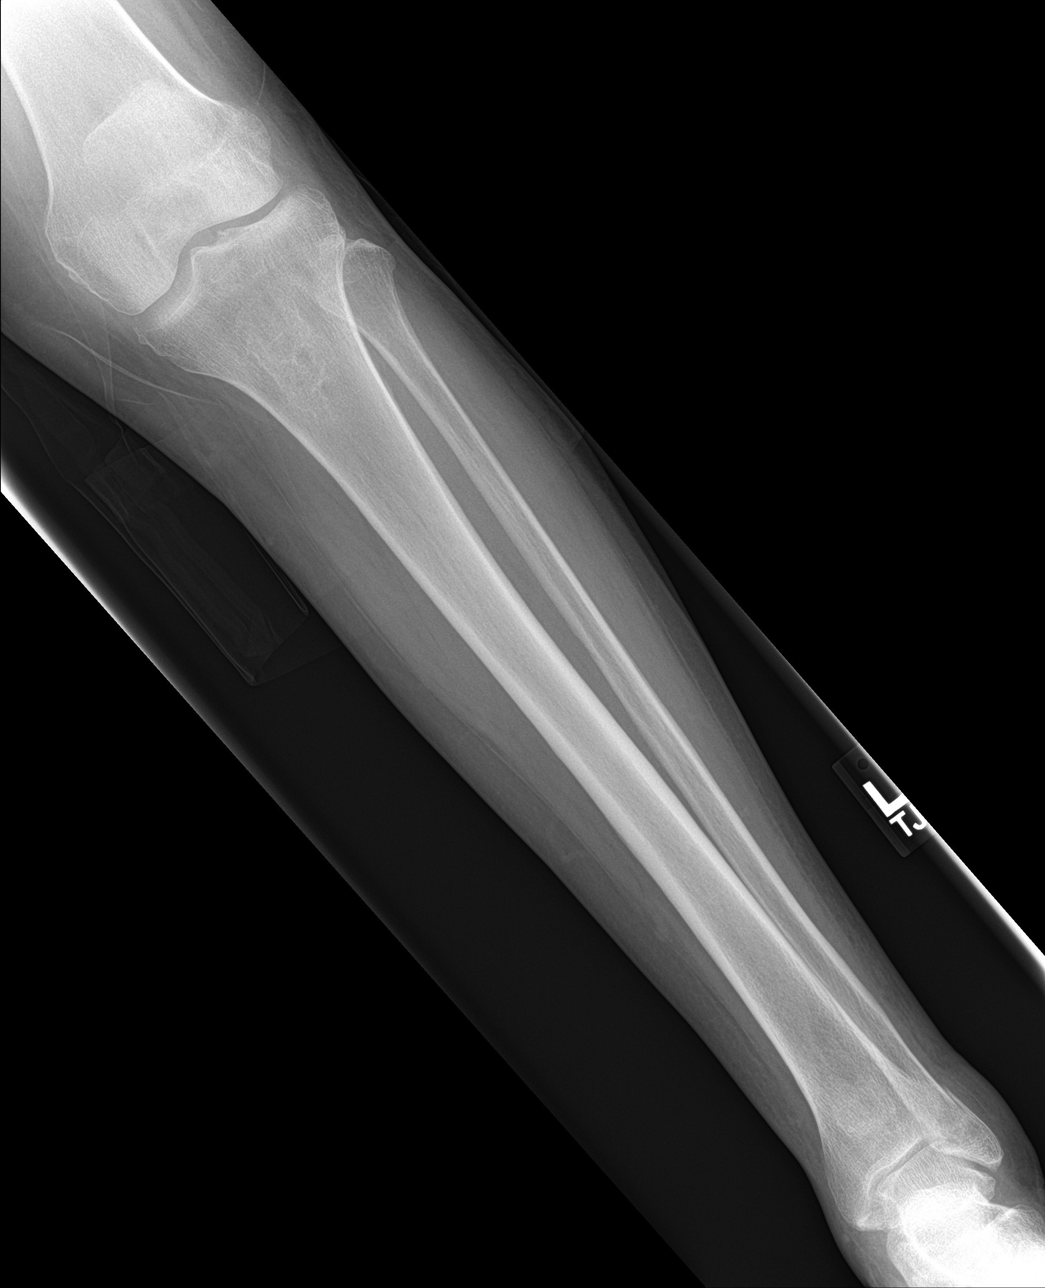

[ankle mortise]
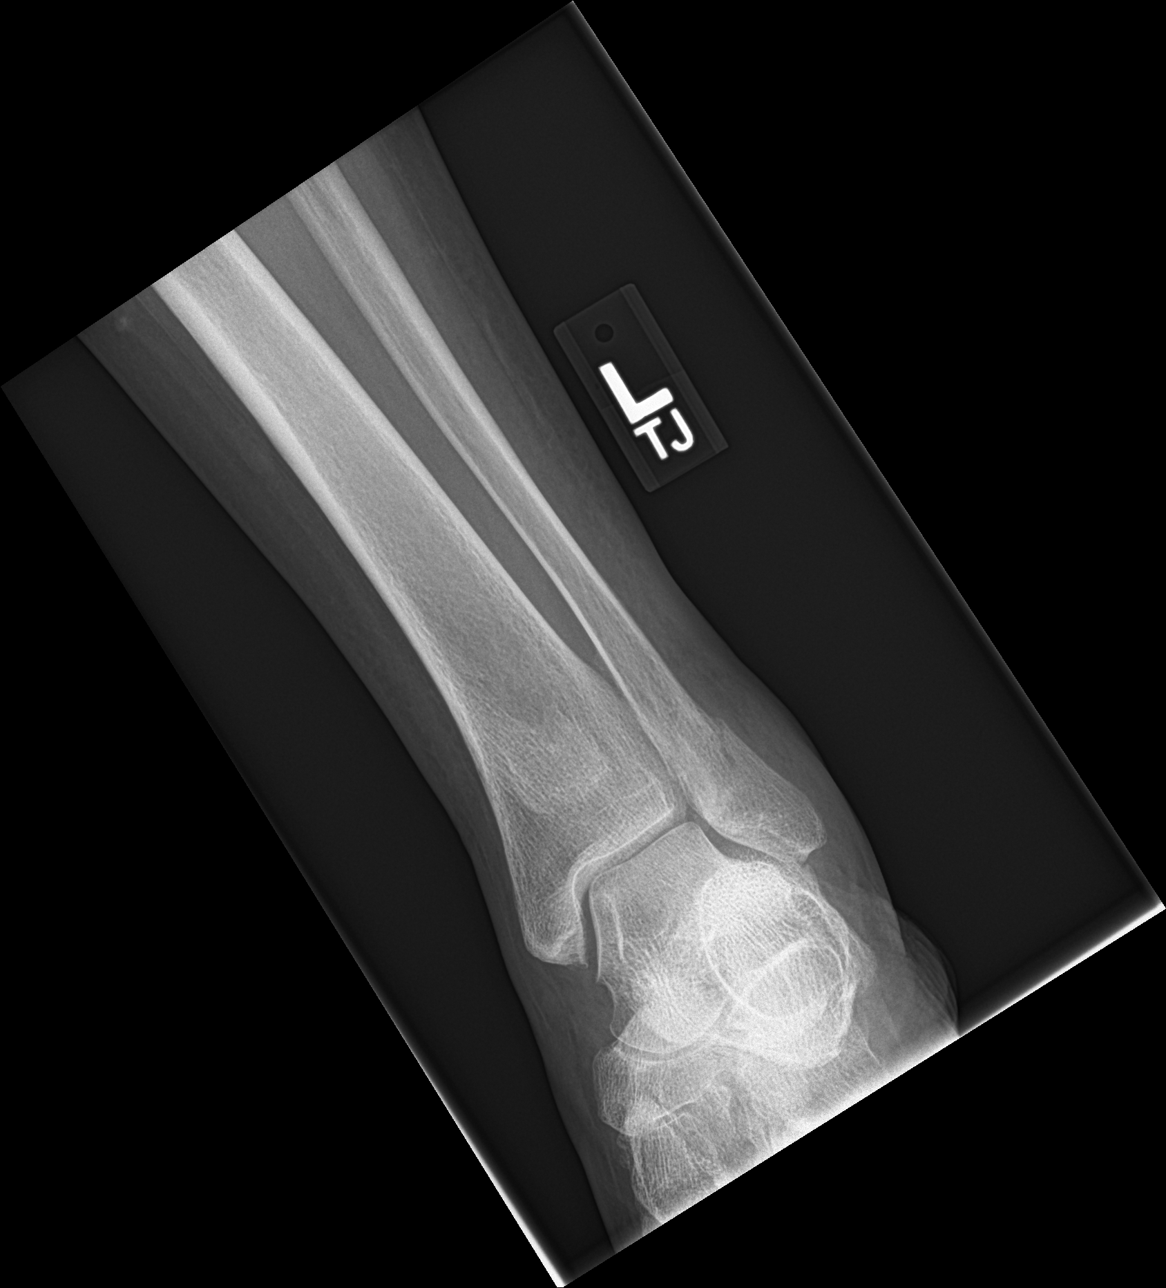

[ankle lat]
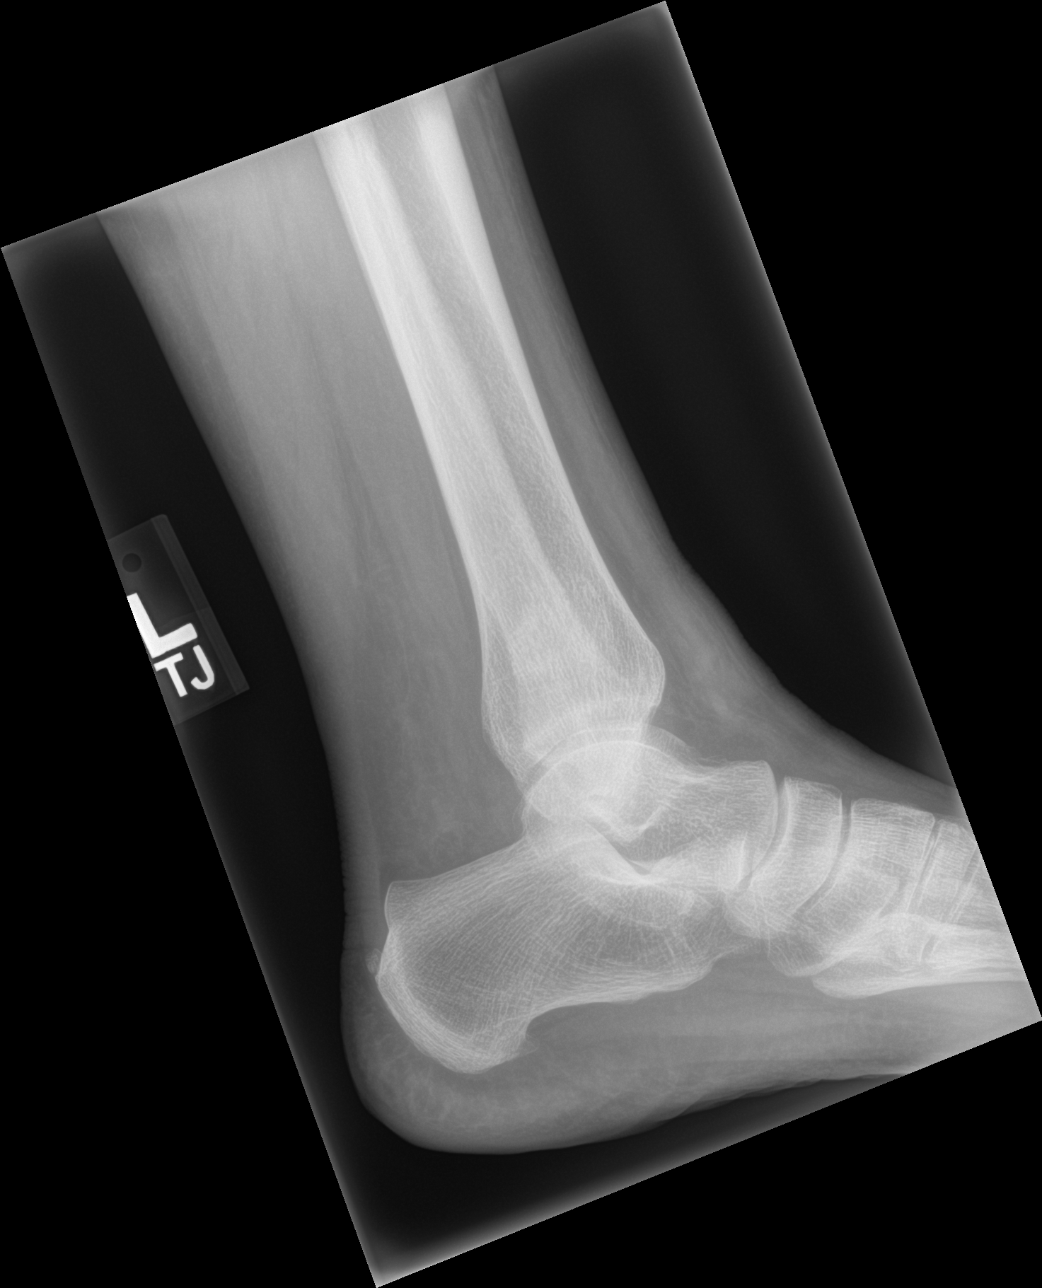

[ankle ap (2 of 2)]
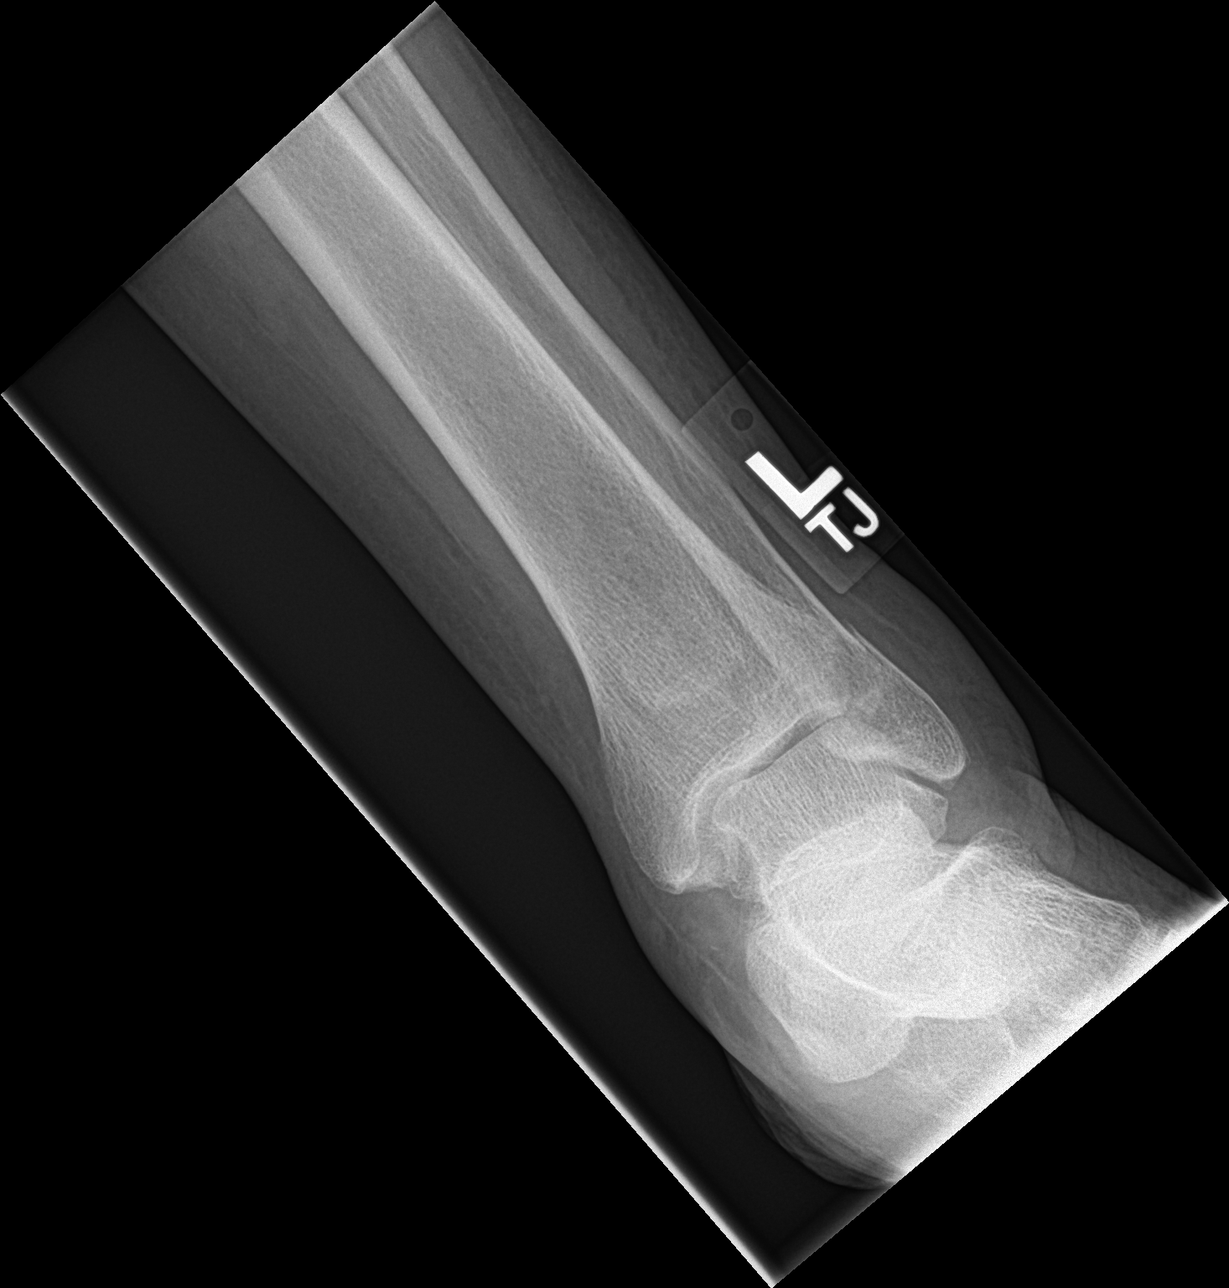

[4 of 4 positions shown; findings below may reference images not displayed]

FINDINGS: Minimally displaced distal fibular fracture is noted with associated
soft tissue swelling. No other fracture is seen.
IMPRESSION: Minimally displaced distal fibular fracture

## 2021-03-04 ENCOUNTER — Inpatient Hospital Stay: Payer: Medicare Other

## 2021-03-04 ENCOUNTER — Emergency Department: Payer: Medicare Other

## 2021-03-04 ENCOUNTER — Other Ambulatory Visit: Payer: Self-pay

## 2021-03-04 ENCOUNTER — Inpatient Hospital Stay
Admission: EM | Admit: 2021-03-04 | Discharge: 2021-03-07 | DRG: 637 | Disposition: A | Discharge status: Home Health | Payer: Medicare Other | Attending: Internal Medicine | Admitting: Internal Medicine

## 2021-03-04 ENCOUNTER — Encounter: Payer: Self-pay | Admitting: Internal Medicine

## 2021-03-04 DIAGNOSIS — I129 Hypertensive chronic kidney disease with stage 1 through stage 4 chronic kidney disease, or unspecified chronic kidney disease: Secondary | ICD-10-CM | POA: Diagnosis not present

## 2021-03-04 DIAGNOSIS — G894 Chronic pain syndrome: Secondary | ICD-10-CM | POA: Diagnosis not present

## 2021-03-04 DIAGNOSIS — I10 Essential (primary) hypertension: Secondary | ICD-10-CM | POA: Diagnosis present

## 2021-03-04 DIAGNOSIS — N3001 Acute cystitis with hematuria: Secondary | ICD-10-CM | POA: Diagnosis present

## 2021-03-04 DIAGNOSIS — Z20822 Contact with and (suspected) exposure to covid-19: Secondary | ICD-10-CM | POA: Diagnosis not present

## 2021-03-04 DIAGNOSIS — T68XXXA Hypothermia, initial encounter: Secondary | ICD-10-CM | POA: Diagnosis not present

## 2021-03-04 DIAGNOSIS — E162 Hypoglycemia, unspecified: Secondary | ICD-10-CM | POA: Diagnosis not present

## 2021-03-04 DIAGNOSIS — G8929 Other chronic pain: Secondary | ICD-10-CM | POA: Diagnosis present

## 2021-03-04 DIAGNOSIS — Z886 Allergy status to analgesic agent status: Secondary | ICD-10-CM

## 2021-03-04 DIAGNOSIS — Z8249 Family history of ischemic heart disease and other diseases of the circulatory system: Secondary | ICD-10-CM

## 2021-03-04 DIAGNOSIS — E1149 Type 2 diabetes mellitus with other diabetic neurological complication: Secondary | ICD-10-CM | POA: Diagnosis present

## 2021-03-04 DIAGNOSIS — W19XXXA Unspecified fall, initial encounter: Secondary | ICD-10-CM | POA: Diagnosis present

## 2021-03-04 DIAGNOSIS — E782 Mixed hyperlipidemia: Secondary | ICD-10-CM | POA: Diagnosis not present

## 2021-03-04 DIAGNOSIS — T68XXXD Hypothermia, subsequent encounter: Secondary | ICD-10-CM | POA: Diagnosis not present

## 2021-03-04 DIAGNOSIS — E161 Other hypoglycemia: Secondary | ICD-10-CM | POA: Diagnosis not present

## 2021-03-04 DIAGNOSIS — I2510 Atherosclerotic heart disease of native coronary artery without angina pectoris: Secondary | ICD-10-CM | POA: Diagnosis present

## 2021-03-04 DIAGNOSIS — R6889 Other general symptoms and signs: Secondary | ICD-10-CM | POA: Diagnosis not present

## 2021-03-04 DIAGNOSIS — N390 Urinary tract infection, site not specified: Secondary | ICD-10-CM | POA: Diagnosis present

## 2021-03-04 DIAGNOSIS — I480 Paroxysmal atrial fibrillation: Secondary | ICD-10-CM | POA: Diagnosis present

## 2021-03-04 DIAGNOSIS — G9340 Encephalopathy, unspecified: Secondary | ICD-10-CM | POA: Diagnosis not present

## 2021-03-04 DIAGNOSIS — F32A Depression, unspecified: Secondary | ICD-10-CM | POA: Diagnosis present

## 2021-03-04 DIAGNOSIS — Z79899 Other long term (current) drug therapy: Secondary | ICD-10-CM

## 2021-03-04 DIAGNOSIS — R0789 Other chest pain: Secondary | ICD-10-CM | POA: Diagnosis not present

## 2021-03-04 DIAGNOSIS — G9341 Metabolic encephalopathy: Secondary | ICD-10-CM | POA: Diagnosis present

## 2021-03-04 DIAGNOSIS — F411 Generalized anxiety disorder: Secondary | ICD-10-CM | POA: Diagnosis present

## 2021-03-04 DIAGNOSIS — Z91040 Latex allergy status: Secondary | ICD-10-CM

## 2021-03-04 DIAGNOSIS — E1122 Type 2 diabetes mellitus with diabetic chronic kidney disease: Secondary | ICD-10-CM | POA: Diagnosis not present

## 2021-03-04 DIAGNOSIS — K7581 Nonalcoholic steatohepatitis (NASH): Secondary | ICD-10-CM | POA: Diagnosis present

## 2021-03-04 DIAGNOSIS — Z794 Long term (current) use of insulin: Secondary | ICD-10-CM

## 2021-03-04 DIAGNOSIS — E538 Deficiency of other specified B group vitamins: Secondary | ICD-10-CM | POA: Diagnosis present

## 2021-03-04 DIAGNOSIS — K219 Gastro-esophageal reflux disease without esophagitis: Secondary | ICD-10-CM | POA: Diagnosis present

## 2021-03-04 DIAGNOSIS — Z888 Allergy status to other drugs, medicaments and biological substances status: Secondary | ICD-10-CM

## 2021-03-04 DIAGNOSIS — N1831 Chronic kidney disease, stage 3a: Secondary | ICD-10-CM | POA: Diagnosis not present

## 2021-03-04 DIAGNOSIS — K766 Portal hypertension: Secondary | ICD-10-CM | POA: Diagnosis not present

## 2021-03-04 DIAGNOSIS — R4182 Altered mental status, unspecified: Secondary | ICD-10-CM

## 2021-03-04 DIAGNOSIS — Z8673 Personal history of transient ischemic attack (TIA), and cerebral infarction without residual deficits: Secondary | ICD-10-CM

## 2021-03-04 DIAGNOSIS — I8500 Esophageal varices without bleeding: Secondary | ICD-10-CM | POA: Diagnosis present

## 2021-03-04 DIAGNOSIS — S2241XA Multiple fractures of ribs, right side, initial encounter for closed fracture: Secondary | ICD-10-CM | POA: Diagnosis present

## 2021-03-04 DIAGNOSIS — R079 Chest pain, unspecified: Secondary | ICD-10-CM | POA: Diagnosis not present

## 2021-03-04 DIAGNOSIS — E039 Hypothyroidism, unspecified: Secondary | ICD-10-CM | POA: Diagnosis present

## 2021-03-04 DIAGNOSIS — E1165 Type 2 diabetes mellitus with hyperglycemia: Secondary | ICD-10-CM

## 2021-03-04 DIAGNOSIS — D631 Anemia in chronic kidney disease: Secondary | ICD-10-CM | POA: Diagnosis present

## 2021-03-04 DIAGNOSIS — K7460 Unspecified cirrhosis of liver: Secondary | ICD-10-CM | POA: Diagnosis present

## 2021-03-04 DIAGNOSIS — I864 Gastric varices: Secondary | ICD-10-CM | POA: Diagnosis not present

## 2021-03-04 DIAGNOSIS — E11649 Type 2 diabetes mellitus with hypoglycemia without coma: Secondary | ICD-10-CM | POA: Diagnosis not present

## 2021-03-04 DIAGNOSIS — B952 Enterococcus as the cause of diseases classified elsewhere: Secondary | ICD-10-CM | POA: Diagnosis present

## 2021-03-04 DIAGNOSIS — Z8619 Personal history of other infectious and parasitic diseases: Secondary | ICD-10-CM

## 2021-03-04 DIAGNOSIS — R52 Pain, unspecified: Secondary | ICD-10-CM | POA: Diagnosis not present

## 2021-03-04 DIAGNOSIS — Z87891 Personal history of nicotine dependence: Secondary | ICD-10-CM

## 2021-03-04 DIAGNOSIS — R410 Disorientation, unspecified: Secondary | ICD-10-CM | POA: Diagnosis not present

## 2021-03-04 DIAGNOSIS — Z885 Allergy status to narcotic agent status: Secondary | ICD-10-CM

## 2021-03-04 DIAGNOSIS — Z881 Allergy status to other antibiotic agents status: Secondary | ICD-10-CM

## 2021-03-04 DIAGNOSIS — Z743 Need for continuous supervision: Secondary | ICD-10-CM | POA: Diagnosis not present

## 2021-03-04 DIAGNOSIS — I4891 Unspecified atrial fibrillation: Secondary | ICD-10-CM | POA: Diagnosis present

## 2021-03-04 DIAGNOSIS — E1140 Type 2 diabetes mellitus with diabetic neuropathy, unspecified: Secondary | ICD-10-CM | POA: Diagnosis not present

## 2021-03-04 LAB — COMPREHENSIVE METABOLIC PANEL
ALT: 62 U/L — ABNORMAL HIGH (ref 0–44)
AST: 66 U/L — ABNORMAL HIGH (ref 15–41)
Albumin: 2.9 g/dL — ABNORMAL LOW (ref 3.5–5.0)
Alkaline Phosphatase: 142 U/L — ABNORMAL HIGH (ref 38–126)
Anion gap: 6 (ref 5–15)
BUN: 14 mg/dL (ref 8–23)
CO2: 27 mmol/L (ref 22–32)
Calcium: 8.4 mg/dL — ABNORMAL LOW (ref 8.9–10.3)
Chloride: 107 mmol/L (ref 98–111)
Creatinine, Ser: 1.02 mg/dL — ABNORMAL HIGH (ref 0.44–1.00)
GFR, Estimated: >60 mL/min (ref >60)
Glucose, Bld: 70 mg/dL (ref 70–99)
Potassium: 3.2 mmol/L — ABNORMAL LOW (ref 3.5–5.1)
Sodium: 140 mmol/L (ref 135–145)
Total Bilirubin: 1 mg/dL (ref 0.3–1.2)
Total Protein: 6.4 g/dL — ABNORMAL LOW (ref 6.5–8.1)

## 2021-03-04 LAB — URINALYSIS, COMPLETE (UACMP) WITH MICROSCOPIC
Bilirubin Urine: NEGATIVE
Glucose, UA: NEGATIVE mg/dL
Ketones, ur: NEGATIVE mg/dL
Nitrite: NEGATIVE
Protein, ur: 30 mg/dL — AB
Specific Gravity, Urine: 1.015 (ref 1.005–1.030)
pH: 6 (ref 5.0–8.0)

## 2021-03-04 LAB — CBC WITH DIFFERENTIAL/PLATELET
Abs Immature Granulocytes: 0.02 10*3/uL (ref 0.00–0.07)
Basophils Absolute: 0 10*3/uL (ref 0.0–0.1)
Basophils Relative: 0 %
Eosinophils Absolute: 0.1 10*3/uL (ref 0.0–0.5)
Eosinophils Relative: 3 %
HCT: 34.2 % — ABNORMAL LOW (ref 36.0–46.0)
Hemoglobin: 10.9 g/dL — ABNORMAL LOW (ref 12.0–15.0)
Immature Granulocytes: 0 %
Lymphocytes Relative: 13 %
Lymphs Abs: 0.6 10*3/uL — ABNORMAL LOW (ref 0.7–4.0)
MCH: 28.2 pg (ref 26.0–34.0)
MCHC: 31.9 g/dL (ref 30.0–36.0)
MCV: 88.4 fL (ref 80.0–100.0)
Monocytes Absolute: 0.5 10*3/uL (ref 0.1–1.0)
Monocytes Relative: 10 %
Neutro Abs: 3.5 10*3/uL (ref 1.7–7.7)
Neutrophils Relative %: 74 %
Platelets: 94 10*3/uL — ABNORMAL LOW (ref 150–400)
RBC: 3.87 MIL/uL (ref 3.87–5.11)
RDW: 16.4 % — ABNORMAL HIGH (ref 11.5–15.5)
Smear Review: NORMAL
WBC: 4.7 10*3/uL (ref 4.0–10.5)
nRBC: 0 % (ref 0.0–0.2)

## 2021-03-04 LAB — LACTIC ACID, PLASMA: Lactic Acid, Venous: 1.7 mmol/L (ref 0.5–1.9)

## 2021-03-04 LAB — AMMONIA: Ammonia: 14 umol/L (ref 9–35)

## 2021-03-04 LAB — TROPONIN I (HIGH SENSITIVITY)
Troponin I (High Sensitivity): 6 ng/L (ref <18)
Troponin I (High Sensitivity): 5 ng/L (ref <18)

## 2021-03-04 LAB — PROTIME-INR
INR: 1 (ref 0.8–1.2)
Prothrombin Time: 13.4 seconds (ref 11.4–15.2)

## 2021-03-04 LAB — CBG MONITORING, ED
Glucose-Capillary: 42 mg/dL — CL (ref 70–99)
Glucose-Capillary: 221 mg/dL — ABNORMAL HIGH (ref 70–99)

## 2021-03-04 LAB — GLUCOSE, CAPILLARY
Glucose-Capillary: 103 mg/dL — ABNORMAL HIGH (ref 70–99)
Glucose-Capillary: 298 mg/dL — ABNORMAL HIGH (ref 70–99)
Glucose-Capillary: 360 mg/dL — ABNORMAL HIGH (ref 70–99)
Glucose-Capillary: 92 mg/dL (ref 70–99)

## 2021-03-04 LAB — RESP PANEL BY RT-PCR (FLU A&B, COVID) ARPGX2
Influenza A by PCR: NEGATIVE
Influenza B by PCR: NEGATIVE
SARS Coronavirus 2 by RT PCR: NEGATIVE

## 2021-03-04 LAB — MAGNESIUM: Magnesium: 2 mg/dL (ref 1.7–2.4)

## 2021-03-04 LAB — APTT: aPTT: 32 seconds (ref 24–36)

## 2021-03-04 LAB — TSH: TSH: 3.774 u[IU]/mL (ref 0.350–4.500)

## 2021-03-04 MED ORDER — ENOXAPARIN SODIUM 40 MG/0.4ML IJ SOSY
40.0000 mg | PREFILLED_SYRINGE | INTRAMUSCULAR | Status: DC
  Administered 2021-03-04 – 2021-03-07 (×4): 40 mg via SUBCUTANEOUS
  Filled 2021-03-04 (×4): qty 0.4

## 2021-03-04 MED ORDER — DOCUSATE SODIUM 100 MG PO CAPS
100.0000 mg | ORAL_CAPSULE | Freq: Every day | ORAL | Status: DC
  Administered 2021-03-04 – 2021-03-07 (×3): 100 mg via ORAL
  Filled 2021-03-04 (×3): qty 1

## 2021-03-04 MED ORDER — LIDOCAINE 5 % EX PTCH
1.0000 | MEDICATED_PATCH | CUTANEOUS | Status: DC
  Administered 2021-03-05 – 2021-03-07 (×3): 1 via TRANSDERMAL
  Filled 2021-03-04 (×4): qty 1

## 2021-03-04 MED ORDER — INSULIN ASPART 100 UNIT/ML IJ SOLN
0.0000 [IU] | Freq: Three times a day (TID) | INTRAMUSCULAR | Status: DC
Start: 2021-03-04 — End: 2021-03-05
  Administered 2021-03-04: 15 [IU] via SUBCUTANEOUS
  Administered 2021-03-05: 5 [IU] via SUBCUTANEOUS
  Filled 2021-03-04 (×2): qty 1

## 2021-03-04 MED ORDER — DEXTROSE 10 % IV SOLN: INTRAVENOUS | Status: DC

## 2021-03-04 MED ORDER — AMLODIPINE BESYLATE 10 MG PO TABS
10.0000 mg | ORAL_TABLET | Freq: Every day | ORAL | Status: DC
  Administered 2021-03-04 – 2021-03-05 (×2): 10 mg via ORAL
  Filled 2021-03-04 (×2): qty 1

## 2021-03-04 MED ORDER — SERTRALINE HCL 50 MG PO TABS
50.0000 mg | ORAL_TABLET | Freq: Every day | ORAL | Status: DC
  Administered 2021-03-04 – 2021-03-07 (×4): 50 mg via ORAL
  Filled 2021-03-04 (×4): qty 1

## 2021-03-04 MED ORDER — ACETAMINOPHEN 325 MG PO TABS
650.0000 mg | ORAL_TABLET | Freq: Four times a day (QID) | ORAL | Status: DC | PRN
  Administered 2021-03-05 – 2021-03-07 (×3): 650 mg via ORAL
  Filled 2021-03-04 (×3): qty 2

## 2021-03-04 MED ORDER — PIPERACILLIN-TAZOBACTAM 3.375 G IVPB 30 MIN
3.3750 g | Freq: Once | INTRAVENOUS | Status: AC
  Administered 2021-03-04: 3.375 g via INTRAVENOUS
  Filled 2021-03-04: qty 50

## 2021-03-04 MED ORDER — KETOROLAC TROMETHAMINE 30 MG/ML IJ SOLN
15.0000 mg | Freq: Four times a day (QID) | INTRAMUSCULAR | Status: DC | PRN
  Administered 2021-03-04 – 2021-03-07 (×9): 15 mg via INTRAVENOUS
  Filled 2021-03-04 (×10): qty 1

## 2021-03-04 MED ORDER — SODIUM CHLORIDE 0.45 % IV SOLN: INTRAVENOUS | Status: DC

## 2021-03-04 MED ORDER — VITAMIN B-12 1000 MCG PO TABS
1000.0000 ug | ORAL_TABLET | Freq: Every day | ORAL | Status: DC
  Administered 2021-03-04 – 2021-03-07 (×4): 1000 ug via ORAL
  Filled 2021-03-04 (×5): qty 1

## 2021-03-04 MED ORDER — POTASSIUM CHLORIDE CRYS ER 20 MEQ PO TBCR
40.0000 meq | EXTENDED_RELEASE_TABLET | Freq: Once | ORAL | Status: AC
  Administered 2021-03-04: 40 meq via ORAL
  Filled 2021-03-04: qty 2

## 2021-03-04 MED ORDER — MELATONIN 5 MG PO TABS
10.0000 mg | ORAL_TABLET | Freq: Every evening | ORAL | Status: DC | PRN
  Filled 2021-03-04: qty 2

## 2021-03-04 MED ORDER — ACETAMINOPHEN 650 MG RE SUPP: 650.0000 mg | Freq: Four times a day (QID) | RECTAL | Status: DC | PRN

## 2021-03-04 MED ORDER — CARVEDILOL 3.125 MG PO TABS
3.1250 mg | ORAL_TABLET | Freq: Two times a day (BID) | ORAL | Status: DC
  Administered 2021-03-04 – 2021-03-07 (×6): 3.125 mg via ORAL
  Filled 2021-03-04 (×6): qty 1

## 2021-03-04 MED ORDER — TRAZODONE HCL 50 MG PO TABS: 25.0000 mg | ORAL_TABLET | Freq: Every evening | ORAL | Status: DC | PRN

## 2021-03-04 MED ORDER — PIPERACILLIN-TAZOBACTAM 3.375 G IVPB
3.3750 g | Freq: Three times a day (TID) | INTRAVENOUS | Status: DC
  Administered 2021-03-04 – 2021-03-06 (×7): 3.375 g via INTRAVENOUS
  Filled 2021-03-04 (×7): qty 50

## 2021-03-04 MED ORDER — SODIUM CHLORIDE 0.9 % IV SOLN: 1.0000 g | Freq: Once | INTRAVENOUS | Status: DC

## 2021-03-04 MED ORDER — LACTULOSE 10 GM/15ML PO SOLN
20.0000 g | Freq: Every day | ORAL | Status: DC
  Administered 2021-03-04 – 2021-03-07 (×3): 20 g via ORAL
  Filled 2021-03-04 (×3): qty 30

## 2021-03-04 MED ORDER — POLYETHYLENE GLYCOL 3350 17 G PO PACK
17.0000 g | PACK | Freq: Two times a day (BID) | ORAL | Status: DC
  Administered 2021-03-05: 17 g via ORAL
  Filled 2021-03-04 (×4): qty 1

## 2021-03-04 NOTE — ED Notes (Signed)
ED Provider at bedside. 

## 2021-03-04 NOTE — ED Provider Notes (Signed)
[facility] Emergency Department Provider Note   ____________________________________________   Event Date/Time   First MD Initiated Contact with Patient 03/04/21 [phone]     (approximate)  I have reviewed the triage vital signs and the nursing notes.   HISTORY  Chief Complaint Hypoglycemia    HPI [name] is a 65 y.o. female with past medical history of hypertension, diabetes, cirrhosis, CKD, atrial fibrillation, and chronic pain syndrome who presents to the ED for hyperglycemia.  History is limited due to patient's altered mental status.  Per EMS, patient was found by her husband to be less responsive than usual, when EMS arrived they found her blood sugar to be 27.  She was subsequently given D10 and blood sugar improved to 116.  EMS reports that patient became more awake and alert, but remains confused.  Patient endorses pain in her chest as well as her back, states these have been going on for a long time.  She denies any fevers, cough, shortness of breath, nausea, vomiting, abdominal pain, diarrhea, dysuria, or hematuria.  She is not sure when her last meal was or when she last took insulin.        Past Medical History:  Diagnosis Date   Acute encephalopathy 05/22/2018   Allergy    Anginal pain (HCC)    Anxiety    Ascites    C. difficile colitis 07/10/2015   Cancer ([place])    HX OF CANCER OF UTERUS    Chronic kidney disease    States she only has 1 working kidney   Cirrhosis of liver not due to alcohol ([place]) 2016   Degenerative disk disease    Diverticulitis    Gastroparesis    GERD (gastroesophageal reflux disease)    History of hiatal hernia    Hypertension    Hypothyroid    Hypothyroidism due to amiodarone    Ileus ([place]) 08/01/2015   Intussusception intestine ([place]) 05/2015   Orthostatic hypotension    PAF (paroxysmal atrial fibrillation) ([place]) 03/2015   a. new onset 03/2015 in setting of intractable N/V; b. on Eliquis 5 mg bid; c.  CHADSVASc 4 (DM, TIA x 2, female)   Pancreatitis    Pneumonia 11/14/2015   Pneumonia 07/2020   Right ureteral stone 07/14/2016   Sepsis ([place]) 12/15/2017   Sick sinus syndrome ([place])    Stomach ulcer    Stroke ([place])    with minimal left sided weakness   Syncope 01/2015   Syncope due to orthostatic hypotension 05/18/2015   Tachyarrhythmia 01/10/2016   TIA (transient ischemic attack) 02/2015   Type 1 diabetes ([place])    on levemir   UTERINE CANCER, HX OF 03/27/2007   Qualifier: Diagnosis of  By: Maxie Better FNP, Rosalita Levan    UTI (urinary tract infection) 05/22/2018    Patient Active Problem List   Diagnosis Date Noted   Encephalopathy 02/11/2021   Cervicalgia 01/11/2021   Cervical facet syndrome 01/11/2021   Cervical facet arthropathy 01/11/2021   Lumbosacral facet arthropathy 01/11/2021   Recurrent falls while walking 01/11/2021   Chronic low back pain (1ry area of Pain) (Bilateral) w/o sciatica 01/11/2021   Acute encephalopathy 12/13/2020   History of substance use disorder 12/12/2020   Substance use disorder 12/12/2020   Hematuria 10/27/2020   Hemoptysis 10/27/2020   Weakness 09/28/2020   COVID-19 virus infection    Stroke ([place]) 09/27/2020   Hypoglycemia 08/27/2020   CKD (chronic kidney disease), stage III ([place]) 08/27/2020   Shortness of  breath 07/16/2020   [place] intertrigo 07/16/2020   Sepsis ([place]) 11/23/2019   Acute pyelonephritis 09/30/2019   Acute pain of left foot 08/18/2019   Type II diabetes mellitus with renal manifestations ([place]) 08/13/2019   Weakness of both lower extremities 07/31/2019   Altered mental status 07/14/2019   GAD (generalized anxiety disorder) 06/11/2019   Poor memory 06/11/2019   Constipation 05/25/2019   Acute medial meniscus tear of left knee 05/25/2019   AKI (acute kidney injury) ([place]) 05/16/2019   Closed fracture of left distal fibula 05/16/2019   Sinus tachycardia 05/05/2019   Congestive dilated cardiomyopathy ([place]) 04/14/2019   CAD  (coronary artery disease) 04/10/2019   Closed fracture of lateral malleolus 04/10/2019   Lumbar facet syndrome (Bilateral) (R>L) 03/09/2019   Long term (current) use of anticoagulants 03/09/2019   Unspecified cirrhosis of liver ([place]) 02/11/2019   Hypoalbuminemia 02/11/2019   Edema due to hypoalbuminemia 02/11/2019   Lower extremity edema 01/26/2019   Prolonged QT interval 12/17/2018   Hypomagnesemia 12/17/2018   Uncontrolled type 2 diabetes mellitus ([place]) 12/17/2018   H/O TIA (transient ischemic attack) and stroke 12/17/2018   Personal history of surgery to heart and great vessels, presenting hazards to health 12/17/2018   DDD (degenerative disc disease), lumbar 12/17/2018   Lumbar intervertebral disc displacement 12/17/2018   Chronic musculoskeletal pain 12/17/2018   Chronic generalized pain 12/17/2018   Hyponatremia 12/17/2018   Fibromyalgia syndrome 12/17/2018   Other intervertebral disc displacement, lumbar region 12/17/2018   Vitamin D deficiency 11/26/2018   Elevated C-reactive protein (CRP) 11/26/2018   Elevated sed rate 11/26/2018   Chronic low back pain (1ry area of Pain) (Bilateral) (L>R) w/ sciatica (Bilateral) 11/25/2018   Chronic lower extremity pain (2ry area of Pain) (Bilateral) (L>R) 11/25/2018   Chronic neck pain 11/25/2018   Pharmacologic therapy 11/25/2018   Disorder of skeletal system 11/25/2018   Problems influencing health status 11/25/2018   Renal mass 10/06/2018   Chronic intermittent abdominal pain 09/25/2018   Unspecified abdominal pain 09/25/2018   Frequent falls 09/12/2018   Orthostatic hypotension 08/18/2018   Normochromic normocytic anemia 08/18/2018   Acute renal failure superimposed on stage 3a chronic kidney disease ([place]) 08/18/2018   History of hypothyroidism 08/14/2018   Medical non-compliance 08/14/2018   Diabetic ketoacidosis ([place]) 05/22/2018   Urinary tract infection, acute 05/22/2018   Diarrhea    NSTEMI (non-ST elevated myocardial  infarction) ([place]) 01/11/2016   Tachy-brady syndrome ([place]) 12/28/2015   PAF (paroxysmal atrial fibrillation) ([place]) 11/24/2015   Type 2 diabetes mellitus with neurologic complication ([place]) 91/69/4503   S/P cholecystectomy 11/23/2015   Narcotic abuse ([place]) 11/22/2015   Chronic superficial gastritis without bleeding 11/22/2015   History of Clostridium difficile 11/22/2015   History of TIA (transient ischemic attack) 11/22/2015   Mixed hyperlipidemia 11/22/2015   Diverticulosis 11/22/2015   History of uterine cancer 11/22/2015   Hypothyroidism, unspecified 11/22/2015   Intractable cyclical vomiting with nausea 11/22/2015   Community acquired pneumonia of left lower lobe of lung 11/14/2015   Narcotic withdrawal ([place]) 11/11/2015   Hyperlipidemia 08/31/2015   Hypotension 08/06/2015   Cryptogenic cirrhosis ([place]) 07/10/2015   Atrial fibrillation ([place]) 07/10/2015   Malnutrition of moderate degree 07/04/2015   Symptomatic bradycardia 05/27/2015   Chronic anemia 05/19/2015   Thrombocytopenia ([place]) 05/19/2015   Hypokalemia 04/06/2015   Hyperlipidemia with target LDL less than 100 04/06/2015   Syncope 03/11/2015   CVA (cerebral vascular accident) ([place]) 02/15/2015   Essential hypertension 01/12/2015   Chronically on opiate therapy 01/12/2015  Cirrhosis of liver not due to alcohol ([place]) 2016   S/P Nissen fundoplication (without gastrostomy tube) procedure 05/11/2014   Dysphagia 04/19/2014   Myofascial pain syndrome 06/27/2007   Chronic pain syndrome 03/28/2007   GERD 03/27/2007   Diverticulosis of large intestine without perforation or abscess without bleeding 03/27/2007   Proteinuria 03/27/2007    Past Surgical History:  Procedure Laterality Date   ABDOMINAL HYSTERECTOMY     CARDIAC CATHETERIZATION N/A 01/12/2016   Procedure: Left Heart Cath and Coronary Angiography;  Surgeon: Wellington Hampshire, MD;  Location: [facility];  Service: Cardiovascular;  Laterality: N/A;    CHOLECYSTECTOMY     CYSTOSCOPY W/ RETROGRADES Bilateral 12/11/2019   Procedure: CYSTOSCOPY WITH RETROGRADE PYELOGRAM;  Surgeon: Billey Co, MD;  Location: [facility] OR[facility];  Service: Urology;  Laterality: Bilateral;   CYSTOSCOPY WITH BIOPSY N/A 12/11/2019   Procedure: CYSTOSCOPY WITH BLADDER BIOPSY;  Surgeon: Billey Co, MD;  Location: [facility];  Service: Urology;  Laterality: N/A;   CYSTOSCOPY WITH FULGERATION N/A 12/11/2019   Procedure: CYSTOSCOPY WITH FULGERATION;  Surgeon: Billey Co, MD;  Location: [facility];  Service: Urology;  Laterality: N/A;   CYSTOSCOPY WITH STENT PLACEMENT Right 12/11/2019   Procedure: CYSTOSCOPY WITH STENT PLACEMENT;  Surgeon: Billey Co, MD;  Location: [facility];  Service: Urology;  Laterality: Right;   CYSTOSCOPY WITH URETHRAL DILATATION Right 12/11/2019   Procedure: CYSTOSCOPY WITH URETERAL DILATATION;  Surgeon: Billey Co, MD;  Location: [facility];  Service: Urology;  Laterality: Right;   CYSTOSCOPY/URETEROSCOPY/HOLMIUM LASER Right 07/14/2016   Procedure: CYSTOSCOPY/URETEROSCOPY/HOLMIUM LASER;  Surgeon: Alexis Frock, MD;  Location: [facility];  Service: Urology;  Laterality: Right;   ESOPHAGOGASTRODUODENOSCOPY N/A 04/04/2015   Procedure: ESOPHAGOGASTRODUODENOSCOPY (EGD);  Surgeon: Hulen Luster, MD;  Location: [facility] ENDOSCOPY;  Service: Endoscopy;  Laterality: N/A;   ESOPHAGOGASTRODUODENOSCOPY N/A 12/28/2017   Procedure: ESOPHAGOGASTRODUODENOSCOPY (EGD);  Surgeon: Lin Landsman, MD;  Location: [facility] ENDOSCOPY;  Service: Gastroenterology;  Laterality: N/A;   ESOPHAGOGASTRODUODENOSCOPY (EGD) WITH PROPOFOL N/A 01/18/2016   Procedure: ESOPHAGOGASTRODUODENOSCOPY (EGD) WITH PROPOFOL;  Surgeon: Lucilla Lame, MD;  Location: [facility] ENDOSCOPY;  Service: Endoscopy;  Laterality: N/A;   FLEXIBLE SIGMOIDOSCOPY N/A 01/18/2016   Procedure: FLEXIBLE SIGMOIDOSCOPY;  Surgeon: Lucilla Lame, MD;  Location: [facility] ENDOSCOPY;  Service: Endoscopy;  Laterality: N/A;   HERNIA REPAIR      URETEROSCOPY Right 12/11/2019   Procedure: Christen Butter;  Surgeon: Billey Co, MD;  Location: [facility] OR[facility];  Service: Urology;  Laterality: Right;    Prior to Admission medications   Medication Sig Start Date End Date Taking? Authorizing Provider  amLODipine (NORVASC) 10 MG tablet Take 1 tablet (10 mg total) by mouth daily. 02/16/21   Thurnell Lose, MD  carvedilol (COREG) 3.125 MG tablet Take 1 tablet (3.125 mg total) by mouth 2 (two) times daily with a meal. 02/15/21   Thurnell Lose, MD  docusate sodium (COLACE) 100 MG capsule Take 1 capsule (100 mg total) by mouth daily. 02/15/21 03/17/21  Thurnell Lose, MD  insulin detemir (LEVEMIR FLEXTOUCH) 100 UNIT/ML FlexPen Please take 30 units daytime , and 18 units at nighttime. 02/27/21   Elgergawy, Silver Huguenin, MD  Insulin Syringe-Needle U-100 25G X 1" 1 ML MISC For 4 times a day insulin SQ, 1 month supply. Diagnosis E11.65 02/15/21   Thurnell Lose, MD  lactulose (CHRONULAC) 10 GM/15ML solution Take 30 mLs (20 g total) by mouth daily. Skip if you have diarrhea. 02/16/21   Thurnell Lose,  MD  Melatonin 10 MG CAPS Take 1 tablet by mouth at bedtime as needed (sleep).    [provider]  polyethylene glycol (MIRALAX / GLYCOLAX) 17 g packet Take 17 g by mouth 2 (two) times daily. 02/15/21   Thurnell Lose, MD  sertraline (ZOLOFT) 50 MG tablet Take 1 tablet (50 mg total) by mouth daily. For anxiety and depression. 10/27/20   [name], NP  vitamin B-12 (CYANOCOBALAMIN) 1000 MCG tablet Take 1 tablet (1,000 mcg total) by mouth daily. 02/16/21   Thurnell Lose, MD    Allergies Latex, Aspirin, Codeine sulfate, Erythromycin, Prednisone, Rosiglitazone maleate, and Tetanus-diphtheria toxoids td  Family History  Problem Relation Age of Onset   Hypertension Mother    CAD Sister    Heart attack Sister        Deceased 11/16/2014   CAD Brother     Social History Social History   Tobacco Use   Smoking status: Former    Pack  years: 0.00    Types: Cigarettes   Smokeless tobacco: Never   Tobacco comments:    25 years ago and only smoked occasionally  Vaping Use   Vaping Use: Never used  Substance Use Topics   Alcohol use: No   Drug use: No    Review of Systems  Constitutional: No fever/chills Eyes: No visual changes. ENT: No sore throat. Cardiovascular: Positive for chest pain. Respiratory: Denies shortness of breath. Gastrointestinal: No abdominal pain.  No nausea, no vomiting.  No diarrhea.  No constipation. Genitourinary: Negative for dysuria. Musculoskeletal: Positive for back pain. Skin: Negative for rash. Neurological: Negative for headaches, focal weakness or numbness.  ____________________________________________   PHYSICAL EXAM:  VITAL SIGNS: ED Triage Vitals [03/04/21 0554]  Enc Vitals Group     BP 99/63     Pulse Rate (!) 56     Resp 16     Temp      Temp src      SpO2 100 %     Weight 140 lb (63.5 kg)     Height 5' 2"  (1.575 m)     Head Circumference      Peak Flow      Pain Score      Pain Loc      Pain Edu?      Excl. in [place]?     Constitutional: Awake and alert, disoriented. Eyes: Conjunctivae are normal. Head: Atraumatic. Nose: No congestion/rhinnorhea. Mouth/Throat: Mucous membranes are moist. Neck: Normal ROM Cardiovascular: Normal rate, regular rhythm. Grossly normal heart sounds.  2+ radial pulses bilaterally. Respiratory: Normal respiratory effort.  No retractions. Lungs CTAB. Gastrointestinal: Soft and nontender. No distention. Genitourinary: deferred Musculoskeletal: No lower extremity tenderness nor edema. Neurologic:  Normal speech and language. No gross focal neurologic deficits are appreciated. Skin:  Skin is warm, dry and intact. No rash noted. Psychiatric: Mood and affect are normal. Speech and behavior are normal.  ____________________________________________   LABS (all labs ordered are listed, but only abnormal results are displayed)  Labs  Reviewed  URINALYSIS, COMPLETE (UACMP) WITH MICROSCOPIC - Abnormal; Notable for the following components:      Result Value   Color, Urine YELLOW (*)    APPearance HAZY (*)    Hgb urine dipstick MODERATE (*)    Protein, ur 30 (*)    Leukocytes,Ua LARGE (*)    Bacteria, UA RARE (*)    All other components within normal limits  CBG MONITORING, ED - Abnormal; Notable for the following  components:   Glucose-Capillary 42 (*)    All other components within normal limits  RESP PANEL BY RT-PCR (FLU A&B, COVID) [id number]  URINE CULTURE  CBC WITH DIFFERENTIAL/PLATELET  COMPREHENSIVE METABOLIC PANEL  TROPONIN I (HIGH SENSITIVITY)   ____________________________________________  EKG  ED ECG REPORT I, Blake Divine, the attending physician, personally viewed and interpreted this ECG.   Date: 03/04/2021  EKG Time: 5:53  Rate: 55  Rhythm: normal sinus rhythm  Axis: Normal  Intervals: Prolonged QT  ST&T Change: None   PROCEDURES  Procedure(s) performed (including Critical Care):  .Critical Care  Date/Time: 03/04/2021 6:52 AM Performed by: Blake Divine, MD Authorized by: Blake Divine, MD   Critical care provider statement:    Critical care time (minutes):  45   Critical care time was exclusive of:  Separately billable procedures and treating other patients and teaching time   Critical care was necessary to treat or prevent imminent or life-threatening deterioration of the following conditions:  Endocrine crisis   Critical care was time spent personally by me on the following activities:  Discussions with consultants, evaluation of patient's response to treatment, examination of patient, ordering and performing treatments and interventions, ordering and review of laboratory studies, ordering and review of radiographic studies, pulse oximetry, re-evaluation of patient's condition, obtaining history from patient or surrogate and review of old charts   I assumed direction of critical  care for this patient from another provider in my specialty: no     Care discussed with: admitting provider     ____________________________________________   INITIAL IMPRESSION / [place] / ED COURSE      65 year old female with past medical history of hypertension, diabetes, CKD, atrial fibrillation, cirrhosis, and chronic pain syndrome who presents to the ED for hyperglycemia after her husband found her to be less responsive than usual.  Patient is awake and alert but disoriented upon arrival to the ED, no focal neurologic deficits noted.  Blood sugar initially improved after D10 with EMS, however now seems to be downtrending in the 69s.  Patient has dealt with recurrent hypoglycemia in the past, is hypoglycemic again today despite decrease in her insulin dosing during last admission 1 month ago.  Patient endorses dysuria and we will further assess for infectious process with UA and chest x-ray.  Patient continues to appear confused despite improved blood glucose and we will check CT head.  Patient now with recurrent hypoglycemia in the 40s and we will start her on a D10 drip.  UA is concerning for infection, we will send for culture and treat with Zosyn given her recurrent UTIs requiring admission.  Additional labs and CT results are pending, patient to be turned over to oncoming provider pending results and reassessment.  Anticipate admission to the hospital for recurrent hypoglycemia and altered mental status.      ____________________________________________   FINAL CLINICAL IMPRESSION(S) / ED DIAGNOSES  Final diagnoses:  Hypoglycemia  Altered mental status, unspecified altered mental status type     ED Discharge Orders     None        Note:  This document was prepared using Dragon voice recognition software and may include unintentional dictation errors.    Blake Divine, MD 03/04/21 [phone]

## 2021-03-04 NOTE — ED Notes (Signed)
xr at bedside

## 2021-03-04 NOTE — ED Notes (Signed)
Warm blankets applied at this time.
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
## 2021-03-04 NOTE — ED Notes (Signed)
This RN and Production assistant, radio attempted IV x2

## 2021-03-04 NOTE — ED Notes (Signed)
Report given to Ariel, RN

## 2021-03-04 NOTE — ED Notes (Signed)
Pt taken for CT 

## 2021-03-04 NOTE — TOC Initial Note (Signed)
Transition of Care [facility]) - Initial/Assessment Note    Patient Details  Name: [name] MRN: [id number] Date of Birth: 24-Mar-1956  Transition of Care [facility]) CM/SW Contact:    Shelbie Hutching, RN Phone Number: 03/04/2021, 2:58 PM  Clinical Narrative:                 Patient admitted to the hospital with hypoglycemia.  RNCM met with patient at the bedside.  Patient is from home with her husband and daughter in law.  Patient has wheelchair, walker, and cane at home.  Patient is current with her PCP.  Patient can drive but her nor her family has a car.  Patient will need transportation home and has used [employer] in the past.   Patient is open with [facility].  RNCM sent Sarah with [facility] a message that patient is admitted.   Patient will need services resumed at discharge.   Expected Discharge Plan: [place] Barriers to Discharge: Continued Medical Work up   Patient Goals and CMS Choice Patient states their goals for this hospitalization and ongoing recovery are:: To get better and go home CMS Medicare.gov Compare Post Acute Care list provided to:: Patient Choice offered to / list presented to : Patient  Expected Discharge Plan and Services Expected Discharge Plan: [place]   Discharge Planning Services: CM Consult Post Acute Care Choice: [place] arrangements for the past 2 months: Mobile Home                 DME Arranged: N/A DME Agency: NA       HH Arranged: RN, PT, OT HH Agency: [facility] Date [place]: 03/04/21 Time [place]: [place] Representative spoke with at [place]: [place]  Prior Living Arrangements/Services Living arrangements for the past 2 months: Mobile Home Lives with:: Spouse, Adult Children Patient language and need for interpreter reviewed:: Yes Do you feel safe going back to the place where you live?: Yes      Need for Family Participation in Patient Care: Yes  (Comment) Care giver support system in place?: Yes (comment) (husband) Current home services: DME (walker, wheelchair, cane, raised toilet seat, wheelchair ramp) Criminal Activity/Legal Involvement Pertinent to Current Situation/Hospitalization: No - Comment as needed  Activities of Daily Living Home Assistive Devices/Equipment: Walker (specify type) ADL Screening (condition at time of admission) Patient's cognitive ability adequate to safely complete daily activities?: No Is the patient deaf or have difficulty hearing?: No Does the patient have difficulty seeing, even when wearing glasses/contacts?: No Does the patient have difficulty concentrating, remembering, or making decisions?: No Patient able to express need for assistance with ADLs?: No Does the patient have difficulty dressing or bathing?: No Independently performs ADLs?: Yes (appropriate for developmental age) Does the patient have difficulty walking or climbing stairs?: Yes Weakness of Legs: Both Weakness of Arms/Hands: None  Permission Sought/Granted Permission sought to share information with : Case Manager, Family Supports, Other (comment) Permission granted to share information with : Yes, Verbal Permission Granted  Share Information with NAME: [name]  Permission granted to share info w AGENCY: [facility]  Permission granted to share info w Relationship: husband     Emotional Assessment Appearance:: Appears older than stated age Attitude/Demeanor/Rapport: Engaged Affect (typically observed): Accepting Orientation: : Oriented to Self, Oriented to Place, Oriented to  Time, Oriented to Situation Alcohol / Substance Use: Not Applicable Psych Involvement: No (comment)  Admission diagnosis:  Hypoglycemia [E16.2] Right-sided chest wall pain [R07.89] Hypoglycemia associated with diabetes ([place]) [E11.649] Acute cystitis with hematuria [N30.01] Hypothermia, initial encounter [T68.XXXA] Altered mental status, unspecified  altered mental status type [R41.82] Patient Active Problem List   Diagnosis Date Noted   Hypothermia 03/04/2021   Hypoglycemia associated with diabetes ([place]) 03/04/2021   Encephalopathy 02/11/2021   Cervicalgia 01/11/2021   Cervical facet syndrome 01/11/2021   Cervical facet arthropathy 01/11/2021   Lumbosacral facet arthropathy 01/11/2021   Recurrent falls while walking 01/11/2021   Chronic low back pain (1ry area of Pain) (Bilateral) w/o sciatica 01/11/2021   Acute encephalopathy 12/13/2020   History of substance use disorder 12/12/2020   Substance use disorder 12/12/2020   Hematuria 10/27/2020   Hemoptysis 10/27/2020   Weakness 09/28/2020   COVID-19 virus infection    Stroke ([place]) 09/27/2020   Hypoglycemia 08/27/2020   CKD (chronic kidney disease), stage III ([place]) 08/27/2020   Shortness of breath 07/16/2020   Candidal intertrigo 07/16/2020   Sepsis ([place]) 11/23/2019   Acute pyelonephritis 09/30/2019   Acute pain of left foot 08/18/2019   Type II diabetes mellitus with renal manifestations ([place]) 08/13/2019   Weakness of both lower extremities 07/31/2019   Altered mental status 07/14/2019   GAD (generalized anxiety disorder) 06/11/2019   Poor memory 06/11/2019   Constipation 05/25/2019   Acute medial meniscus tear of left knee 05/25/2019   AKI (acute kidney injury) ([place]) 05/16/2019   Closed fracture of left distal fibula 05/16/2019   Sinus tachycardia 05/05/2019   Congestive dilated cardiomyopathy ([place]) 04/14/2019   CAD (coronary artery disease) 04/10/2019   Closed fracture of lateral malleolus 04/10/2019   Lumbar facet syndrome (Bilateral) (R>L) 03/09/2019   Long term (current) use of anticoagulants 03/09/2019   Unspecified cirrhosis of liver ([place]) 02/11/2019   Hypoalbuminemia 02/11/2019   Edema due to hypoalbuminemia 02/11/2019   Lower extremity edema 01/26/2019   Prolonged QT interval 12/17/2018   Hypomagnesemia 12/17/2018   Uncontrolled type 2 diabetes mellitus  ([place]) 12/17/2018   H/O TIA (transient ischemic attack) and stroke 12/17/2018   Personal history of surgery to heart and great vessels, presenting hazards to health 12/17/2018   DDD (degenerative disc disease), lumbar 12/17/2018   Lumbar intervertebral disc displacement 12/17/2018   Chronic musculoskeletal pain 12/17/2018   Chronic generalized pain 12/17/2018   Hyponatremia 12/17/2018   Fibromyalgia syndrome 12/17/2018   Other intervertebral disc displacement, lumbar region 12/17/2018   Vitamin D deficiency 11/26/2018   Elevated C-reactive protein (CRP) 11/26/2018   Elevated sed rate 11/26/2018   Chronic low back pain (1ry area of Pain) (Bilateral) (L>R) w/ sciatica (Bilateral) 11/25/2018   Chronic lower extremity pain (2ry area of Pain) (Bilateral) (L>R) 11/25/2018   Chronic neck pain 11/25/2018   Pharmacologic therapy 11/25/2018   Disorder of skeletal system 11/25/2018   Problems influencing health status 11/25/2018   Renal mass 10/06/2018   Chronic intermittent abdominal pain 09/25/2018   Unspecified abdominal pain 09/25/2018   Frequent falls 09/12/2018   Orthostatic hypotension 08/18/2018   Normochromic normocytic anemia 08/18/2018   Acute renal failure superimposed on stage 3a chronic kidney disease ([place]) 08/18/2018   History of hypothyroidism 08/14/2018   Medical non-compliance 08/14/2018   Diabetic ketoacidosis ([place]) 05/22/2018   Acute lower UTI 05/22/2018   Diarrhea    NSTEMI (non-ST elevated myocardial infarction) ([place]) 01/11/2016   Tachy-brady syndrome ([place]) 12/28/2015   PAF (paroxysmal atrial fibrillation) ([place]) 11/24/2015   Type 2 diabetes mellitus with neurologic complication ([place]) 36/62/9476   S/P cholecystectomy 11/23/2015  Narcotic abuse ([place]) 11/22/2015   Chronic superficial gastritis without bleeding 11/22/2015   History of Clostridium difficile 11/22/2015   History of TIA (transient ischemic attack) 11/22/2015   Mixed hyperlipidemia 11/22/2015    Diverticulosis 11/22/2015   History of uterine cancer 11/22/2015   Hypothyroidism, unspecified 11/22/2015   Intractable cyclical vomiting with nausea 11/22/2015   Community acquired pneumonia of left lower lobe of lung 11/14/2015   Narcotic withdrawal ([place]) 11/11/2015   Hyperlipidemia 08/31/2015   Hypotension 08/06/2015   Cryptogenic cirrhosis ([place]) 07/10/2015   Atrial fibrillation ([place]) 07/10/2015   Malnutrition of moderate degree 07/04/2015   Symptomatic bradycardia 05/27/2015   Chronic anemia 05/19/2015   Thrombocytopenia ([place]) 05/19/2015   Hypokalemia 04/06/2015   Hyperlipidemia with target LDL less than 100 04/06/2015   Syncope 03/11/2015   CVA (cerebral vascular accident) ([place]) 02/15/2015   Essential hypertension 01/12/2015   Chronically on opiate therapy 01/12/2015   Cirrhosis of liver not due to alcohol ([place]) 2016   S/P Nissen fundoplication (without gastrostomy tube) procedure 05/11/2014   Dysphagia 04/19/2014   Myofascial pain syndrome 06/27/2007   Chronic pain syndrome 03/28/2007   GERD 03/27/2007   Diverticulosis of large intestine without perforation or abscess without bleeding 03/27/2007   Proteinuria 03/27/2007   PCP:  [name], NP Pharmacy:   [place], [place] - [address] [place] [address] Phone: [phone] Fax: [phone]     Social Determinants of Health (SDOH) Interventions    Readmission Risk Interventions Readmission Risk Prevention Plan 10/01/2019 07/18/2019 07/16/2019  Transportation Screening Complete Complete Complete  Medication Review Press photographer) Complete Complete Complete  PCP or Specialist appointment within 3-5 days of discharge Complete Complete -  [place] or Home Care Consult Complete Complete Complete  SW Recovery Care/Counseling Consult Complete Complete -  Palliative Care Screening Not Applicable Not Applicable Not Applicable  Skilled Nursing Facility - Not Applicable Patient  Refused  Some recent data might be hidden

## 2021-03-04 NOTE — ED Triage Notes (Signed)
Pt from home with hypoglycemia. Per ems fsbs was 27, received ampof d10, sugar to 116. Pt arrives confused. Complains of chest and back pain.

## 2021-03-04 NOTE — ED Provider Notes (Signed)
I assumed care of the patient approximately 0 700.  Please see operative progress note for full details about patient's initial evaluation assessment.  In brief patient presents with history of HTN, DM, cirrhosis, CKD and A. fib and chronic pain with recent admission discharged on 624 encephalopathy thought to be secondary to UTI and hypoglycemia who presents for assessment of altered mentation after EMS was called by husband.  Patient found to be hypoglycemic initially with glucose in the 20s with EMS and received D10 bolus.  She is also in addition to being brought by ischemic on arrival emergency room noted to be hypothermic with a initial rectal temp of 92.8.  Otherwise she is afebrile with stable vital signs on room air.  She is nonfocal neuro exam without evidence of trauma on exam for ongoing provider.  CBC is unremarkable.  Baseline anemia.  CMP shows a K of 3.2 and a glucose of 70 with very slight transaminitis with an AST of 66 ALT of 62.  Bilirubin is normal.  Urinalysis consistent with possible UTI with large leukocyte esterase, 21-50 RBCs, 20-50 WBCs and rare bacteria with moderate hemoglobin.  Troponin is nonelevated at 6.  COVID influenza test negative.  Recheck of glucose noted to be 42.  Patient given juice and sandwiches and noted bleeding both on my assessment.  She states she is feeling a little better.  She is still not sure where she is.  I did review her head CT and chest x-ray which showed no large hemorrhage or clear acute intracranial process or thoracic process.  No large focal consolidation, pneumothorax or significant pulmonary edema.  Patient given antibiotics and start on D10 drip 5 g rider.  We will add a blood culture and lactic acid.  Also add TSH and magnesium.  Potassium repleted.  I will admit to hospital service for further evaluation management.   Lucrezia Starch, MD 03/04/21 [phone]

## 2021-03-04 NOTE — ED Notes (Signed)
Informed RN bed assigned 

## 2021-03-04 NOTE — ED Notes (Signed)
Urine culture add on via lab.

## 2021-03-05 DIAGNOSIS — R52 Pain, unspecified: Secondary | ICD-10-CM

## 2021-03-05 DIAGNOSIS — I480 Paroxysmal atrial fibrillation: Secondary | ICD-10-CM

## 2021-03-05 DIAGNOSIS — R0789 Other chest pain: Secondary | ICD-10-CM | POA: Diagnosis present

## 2021-03-05 DIAGNOSIS — E11649 Type 2 diabetes mellitus with hypoglycemia without coma: Principal | ICD-10-CM

## 2021-03-05 DIAGNOSIS — G9340 Encephalopathy, unspecified: Secondary | ICD-10-CM

## 2021-03-05 DIAGNOSIS — G8929 Other chronic pain: Secondary | ICD-10-CM

## 2021-03-05 DIAGNOSIS — Z794 Long term (current) use of insulin: Secondary | ICD-10-CM

## 2021-03-05 DIAGNOSIS — I10 Essential (primary) hypertension: Secondary | ICD-10-CM

## 2021-03-05 DIAGNOSIS — F411 Generalized anxiety disorder: Secondary | ICD-10-CM

## 2021-03-05 DIAGNOSIS — E782 Mixed hyperlipidemia: Secondary | ICD-10-CM

## 2021-03-05 DIAGNOSIS — E1140 Type 2 diabetes mellitus with diabetic neuropathy, unspecified: Secondary | ICD-10-CM

## 2021-03-05 DIAGNOSIS — N3001 Acute cystitis with hematuria: Secondary | ICD-10-CM | POA: Diagnosis present

## 2021-03-05 DIAGNOSIS — T68XXXD Hypothermia, subsequent encounter: Secondary | ICD-10-CM

## 2021-03-05 LAB — URINE CULTURE: Culture: NO GROWTH

## 2021-03-05 LAB — BASIC METABOLIC PANEL
Anion gap: 6 (ref 5–15)
BUN: 18 mg/dL (ref 8–23)
CO2: 24 mmol/L (ref 22–32)
Calcium: 8.3 mg/dL — ABNORMAL LOW (ref 8.9–10.3)
Chloride: 106 mmol/L (ref 98–111)
Creatinine, Ser: 1.29 mg/dL — ABNORMAL HIGH (ref 0.44–1.00)
GFR, Estimated: 45 mL/min — ABNORMAL LOW (ref >60)
Glucose, Bld: 232 mg/dL — ABNORMAL HIGH (ref 70–99)
Potassium: 5.2 mmol/L — ABNORMAL HIGH (ref 3.5–5.1)
Sodium: 136 mmol/L (ref 135–145)

## 2021-03-05 LAB — GLUCOSE, CAPILLARY
Glucose-Capillary: 193 mg/dL — ABNORMAL HIGH (ref 70–99)
Glucose-Capillary: 216 mg/dL — ABNORMAL HIGH (ref 70–99)
Glucose-Capillary: 209 mg/dL — ABNORMAL HIGH (ref 70–99)
Glucose-Capillary: 213 mg/dL — ABNORMAL HIGH (ref 70–99)
Glucose-Capillary: 202 mg/dL — ABNORMAL HIGH (ref 70–99)

## 2021-03-05 MED ORDER — ALUM & MAG HYDROXIDE-SIMETH 200-200-20 MG/5ML PO SUSP
30.0000 mL | ORAL | Status: DC | PRN
  Administered 2021-03-05 – 2021-03-07 (×5): 30 mL via ORAL
  Filled 2021-03-05 (×5): qty 30

## 2021-03-05 MED ORDER — SODIUM CHLORIDE 0.9 % IV SOLN: INTRAVENOUS | Status: DC | PRN

## 2021-03-05 MED ORDER — INSULIN ASPART 100 UNIT/ML IJ SOLN
0.0000 [IU] | INTRAMUSCULAR | Status: DC
  Administered 2021-03-05 (×3): 5 [IU] via SUBCUTANEOUS
  Administered 2021-03-06 (×2): 3 [IU] via SUBCUTANEOUS
  Filled 2021-03-05 (×5): qty 1

## 2021-03-05 NOTE — Evaluation (Signed)
Occupational Therapy Evaluation Patient Details Name: [name] MRN: [id number] DOB: 19-Sep-1955 Today's Date: 03/05/2021    History of Present Illness Pt is a 65 y/o F admitted on 03/04/21 with c/c of low blood sugar & confusion (blood glucose initially in the 20s). Imaging revealed subacute R rib (8-10) fxs. Pt with recent admission 02/22/21-03/03/21 & diagnosed with encephalopathy thought to be 2/2 to enterococcal UTI sepsis & hypoglycemia. PMH: HTN, DM, cirrhosis 2/2 NASH, CKD 3, B12 deficiency, chronic pain   Clinical Impression   Pt seen for OT evaluation on this date. Upon arrival to room, pt awake and seated upright in bed. Pt A&O x 4 and reporting pain at rib fx site, however, agreeable to tx. Prior to admission, pt was using a cane/RW for functional mobility and receiving supervision from husband for ADLs. Pt endorses 30-40 falls in the past 6 months. Pt currently presents with increased pain, and decreased balance, strength, and activity tolerance, and requires MIN GUARD for bed mobility and functional mobility of short household distances (~30 ft) with RW, SUPERVISION for standing grooming tasks, and MIN GUARD for seated LB dressing. Of note, pt reporting minimal dizziness while sitting EOB following supine>sit transfer, however symptoms resolving following seated therex. HR 70s and SpO2 >95% throughout session. Pt would benefit from additional skilled OT services to maximize return to PLOF and minimize risk of future falls, injury, caregiver burden, and readmission. Upon discharge, recommend home health OT and supervision with OOB mobility.    Follow Up Recommendations  Home health OT;Supervision - Intermittent    Equipment Recommendations  None recommended by OT       Precautions / Restrictions Precautions Precautions: Fall Restrictions Weight Bearing Restrictions: No      Mobility Bed Mobility Overal bed mobility: Needs Assistance Bed Mobility: Supine to Sit;Sit to  Supine     Supine to sit: Min guard;HOB elevated Sit to supine: Min guard;HOB elevated   General bed mobility comments: able to complete with use of bedrails. Pt reporting minimal dizziness initially sitting EOB, however resolving following seated therex    Transfers Overall transfer level: Needs assistance Equipment used: Rolling walker (2 wheeled) Transfers: Sit to/from Stand Sit to Stand: Min guard         General transfer comment: Denies any dizzziness upon standing    Balance Overall balance assessment: Needs assistance Sitting-balance support: No upper extremity supported;Feet supported Sitting balance-Leahy Scale: Good Sitting balance - Comments: Good sitting balance reaching within BOS while sitting EOB   Standing balance support: No upper extremity supported;During functional activity Standing balance-Leahy Scale: Fair Standing balance comment: able to maintain balance while using b/l UE to wash face.                           ADL either performed or assessed with clinical judgement   ADL Overall ADL's : Needs assistance/impaired     Grooming: Wash/dry hands;Wash/dry face;Supervision/safety;Set up;Standing               Lower Body Dressing: Min guard;Sitting/lateral leans Lower Body Dressing Details (indicate cue type and reason): to don/doff socks via figure-4 position             Functional mobility during ADLs: Min guard;Rolling walker       Vision Baseline Vision/History: Wears glasses (pt reports needing glasses for distance but has not had the opportunity to obtain d/t multiple recent hospitalizations) Patient Visual Report: No change from baseline  Pertinent Vitals/Pain Pain Assessment: Faces Faces Pain Scale: Hurts little more Pain Location: R rib cage with movement Pain Descriptors / Indicators: Grimacing;Sore Pain Intervention(s): Limited activity within patient's tolerance;Monitored during  session;Repositioned        Extremity/Trunk Assessment Upper Extremity Assessment Upper Extremity Assessment: Generalized weakness   Lower Extremity Assessment Lower Extremity Assessment: Generalized weakness       Communication Communication Communication: No difficulties   Cognition Arousal/Alertness: Awake/alert Behavior During Therapy: WFL for tasks assessed/performed Overall Cognitive Status: Within Functional Limits for tasks assessed                                 General Comments: AxOx4. Pleasant and agreeable throughout   General Comments  Pt reporting minimal dizziness initially sitting EOB, however resolving following seated therex    Exercises General Exercises - Upper Extremity Shoulder Flexion: AROM;Both;10 reps;Seated General Exercises - Lower Extremity Ankle Circles/Pumps: AROM;10 reps;Seated Long Arc Quad: AROM;Both;10 reps;Seated Hip Flexion/Marching: AROM;Both;10 reps;Standing        Home Living Family/patient expects to be discharged to:: Private residence Living Arrangements: Spouse/significant other Available Help at Discharge: Family;Available 24 hours/day Type of Home: Mobile home Home Access: Ramped entrance     Home Layout: One level     Bathroom Shower/Tub: Teacher, early years/pre: Standard     Home Equipment: Environmental consultant - 2 wheels;Bedside commode;Shower seat;Wheelchair - manual;Grab bars - tub/shower;Cane - single point   Additional Comments: Per chart review, husband has a disability.  Lives With: Spouse    Prior Functioning/Environment Level of Independence: Needs assistance  Gait / Transfers Assistance Needed: Pt reports she ambulates with cane in the home (with supervision from husband), RW outside of the home. Pt endorses 30-40 falls in the past 6 months. ADL's / Homemaking Assistance Needed: Pt reports being independent with dressing and receiving some assistance from husband for bathing             OT Problem List: Impaired balance (sitting and/or standing);Decreased strength;Decreased activity tolerance;Pain      OT Treatment/Interventions: Self-care/ADL training;Therapeutic exercise;Energy conservation;DME and/or AE instruction;Therapeutic activities;Patient/family education;Balance training    OT Goals(Current goals can be found in the care plan section) Acute Rehab OT Goals Patient Stated Goal: get better OT Goal Formulation: With patient Time For Goal Achievement: 03/19/21 Potential to Achieve Goals: Good ADL Goals Pt Will Perform Lower Body Bathing: with supervision;sit to/from stand Pt Will Transfer to Toilet: with supervision;ambulating;regular height toilet Pt Will Perform Toileting - Clothing Manipulation and hygiene: with supervision;sitting/lateral leans  OT Frequency: Min 1X/week    AM-PAC OT "6 Clicks" Daily Activity     Outcome Measure Help from another person eating meals?: None Help from another person taking care of personal grooming?: A Little Help from another person toileting, which includes using toliet, bedpan, or urinal?: A Little Help from another person bathing (including washing, rinsing, drying)?: A Lot Help from another person to put on and taking off regular upper body clothing?: A Little Help from another person to put on and taking off regular lower body clothing?: A Little 6 Click Score: 18   End of Session Equipment Utilized During Treatment: Gait belt;Rolling walker Nurse Communication: Mobility status  Activity Tolerance: Patient tolerated treatment well Patient left: in bed;with call bell/phone within reach;with bed alarm set  OT Visit Diagnosis: Unsteadiness on feet (R26.81);Pain Pain - part of body:  (ribs)  Time: 2902-1115 OT Time Calculation (min): 21 min Charges:  OT General Charges $OT Visit: 1 Visit OT Evaluation $OT Eval Moderate Complexity: 1 Mod OT Treatments $Self Care/Home Management : 8-22 mins  Fredirick Maudlin, OTR/L [place]

## 2021-03-05 NOTE — Evaluation (Signed)
Physical Therapy Evaluation Patient Details Name: [name] MRN: [id number] DOB: 1955-11-03 Today's Date: 03/05/2021   History of Present Illness  Pt is a 65 y/o F admitted on 03/04/21 with c/c of low blood sugar & confusion (blood glucose initially in the 20s). Imaging revealed subacute R rib (8-10) fxs. Pt with recent admission 02/22/21-03/03/21 & diagnosed with encephalopathy thought to be 2/2 to enterococcal UTI sepsis & hypoglycemia. PMH: HTN, DM, cirrhosis 2/2 NASH, CKD 3, B12 deficiency, chronic pain  Clinical Impression  MD cleared pt for participation in PT evaluation in setting of slightly elevated K+ (5.2). Pt seen for PT evaluation with pt reporting she ambulates with a cane in her mobile home, RW outside of the home. Pt endorses 30-40 falls in the past 6 months. On this date, pt required min assist for bed mobility 2/2 R rib pain, CGA for transfers & CGA for gait with RW. After gait pt endorses fatigue & chest pain, "like my heart is beating fast" but HR 77-81 bpm, SpO2 >90% on room air; pt does note this has been happening at home too & nurse notified. Pt left in recliner to rest. Pt would benefit from acute PT services to address balance, endurance, & gait with LRAD.    Follow Up Recommendations Home health PT;Supervision/Assistance - 24 hour    Equipment Recommendations  None recommended by PT    Recommendations for Other Services OT consult     Precautions / Restrictions Precautions Precautions: Fall Restrictions Weight Bearing Restrictions: No      Mobility  Bed Mobility Overal bed mobility: Needs Assistance Bed Mobility: Supine to Sit     Supine to sit: Min assist;HOB elevated     General bed mobility comments: holds to PT's hand to pull trunk to upright sitting 2/2 increased R rib pain with movement    Transfers Overall transfer level: Needs assistance Equipment used: Rolling walker (2 wheeled) Transfers: Sit to/from Stand Sit to Stand: Min guard             Ambulation/Gait Ambulation/Gait assistance: Min guard Gait Distance (Feet): 55 Feet Assistive device: Rolling walker (2 wheeled) Gait Pattern/deviations: Decreased step length - right;Decreased step length - left;Decreased stride length Gait velocity: decreased      Stairs            Wheelchair Mobility    Modified Rankin (Stroke Patients Only)       Balance Overall balance assessment: Needs assistance Sitting-balance support: Feet supported Sitting balance-Leahy Scale: Good       Standing balance-Leahy Scale: Fair                               Pertinent Vitals/Pain Pain Assessment: Faces Faces Pain Scale: Hurts little more Pain Location: R rib cage with movement Pain Descriptors / Indicators: Grimacing;Sore Pain Intervention(s): Monitored during session;Repositioned;Limited activity within patient's tolerance    Home Living Family/patient expects to be discharged to:: Private residence Living Arrangements: Spouse/significant other (adult children) Available Help at Discharge: Family;Available 24 hours/day Type of Home: Mobile home Home Access: Ramped entrance     Home Layout: One level Home Equipment: Loving - 2 wheels;Bedside commode;Shower seat;Wheelchair - manual;Grab bars - tub/shower;Cane - single point      Prior Function Level of Independence: Needs assistance   Gait / Transfers Assistance Needed: Pt reports she ambulates with cane in the home, RW outside of the home. Endorses 30-40 falls in the past 6  months.           Hand Dominance        Extremity/Trunk Assessment   Upper Extremity Assessment Upper Extremity Assessment: Generalized weakness    Lower Extremity Assessment Lower Extremity Assessment: Generalized weakness       Communication   Communication: No difficulties  Cognition Arousal/Alertness: Awake/alert Behavior During Therapy: WFL for tasks assessed/performed Overall Cognitive Status: Within  Functional Limits for tasks assessed                                 General Comments: AxOx4, pleasant lady      General Comments General comments (skin integrity, edema, etc.): Endorses lightheadedness with sit>stand but reports symptoms go away within ~20 seconds    Exercises     Assessment/Plan    PT Assessment Patient needs continued PT services  PT Problem List Decreased strength;Decreased activity tolerance;Decreased balance;Decreased mobility;Decreased cognition;Decreased knowledge of use of DME;Decreased safety awareness;Pain       PT Treatment Interventions DME instruction;Gait training;Stair training;Functional mobility training;Therapeutic activities;Therapeutic exercise;Balance training;Cognitive remediation;Patient/family education    PT Goals (Current goals can be found in the Care Plan section)  Acute Rehab PT Goals Patient Stated Goal: get better PT Goal Formulation: With patient Time For Goal Achievement: 03/19/21 Potential to Achieve Goals: Good    Frequency Min 2X/week   Barriers to discharge        Co-evaluation               AM-PAC PT "6 Clicks" Mobility  Outcome Measure Help needed turning from your back to your side while in a flat bed without using bedrails?: A Little Help needed moving from lying on your back to sitting on the side of a flat bed without using bedrails?: A Little Help needed moving to and from a bed to a chair (including a wheelchair)?: A Little Help needed standing up from a chair using your arms (e.g., wheelchair or bedside chair)?: A Little Help needed to walk in hospital room?: A Little Help needed climbing 3-5 steps with a railing? : A Lot 6 Click Score: 17    End of Session Equipment Utilized During Treatment: Gait belt Activity Tolerance: Treatment limited secondary to medical complications (Comment) Patient left: in chair;with call bell/phone within reach;with chair alarm set Nurse Communication:  Mobility status PT Visit Diagnosis: Muscle weakness (generalized) (M62.81);Unsteadiness on feet (R26.81);History of falling (Z91.81)    Time: 3785-8850 PT Time Calculation (min) (ACUTE ONLY): 14 min   Charges:   PT Evaluation $PT Eval Low Complexity: [place], PT, DPT 03/05/21, 2:07 PM   Waunita Schooner 03/05/2021, 2:05 PM

## 2021-03-05 NOTE — Progress Notes (Signed)
PROGRESS NOTE    [name]  XBW:[id number] DOB: [birth year] DOA: 03/04/2021 PCP: [name], NP    Chief Complaint  Patient presents with   Hypoglycemia    Brief Narrative:  Patient is 65 year old female history of hypertension, diabetes, cirrhosis secondary to [facility], CKD, paroxysmal A. fib, CKD 3, B12 deficiency and chronic pain recently hospitalized 02/22/2021-02/27/2021 for acute encephalopathy felt secondary to enterococcal UTI sepsis and hypoglycemia presenting to the ED with altered mental status noted to have an initial CBG in the 20s and received D10 bolus.  On arrival to the ED patient noted to be hypothermic with rectal temperature of 92.8, urinalysis concerning for UTI.  Head CT unremarkable.  Chest x-ray with no acute changes.  Patient placed on IV Zosyn, D10, admitted and followed.   Assessment & Plan:   Active Problems:   Essential hypertension   Acute lower UTI   PAF (paroxysmal atrial fibrillation) (HCC)   Type 2 diabetes mellitus with neurologic complication (HCC)   Mixed hyperlipidemia   Hypothyroidism, unspecified   Chronic generalized pain   GAD (generalized anxiety disorder)   Cirrhosis of liver not due to alcohol ([place])   Acute encephalopathy   Hypothermia   Hypoglycemia associated with diabetes ([place])   Acute cystitis with hematuria   Right-sided chest wall pain   #1 acute metabolic encephalopathy -Secondary to hypoglycemia, probable UTI. -Patient recently hospitalized 02/22/2021-02/27/2021 for acute encephalopathy secondary to Enterococcus UTI sepsis and hypoglycemia. -Patient admitted 02/11/2021 and 02/22/2021 with acute metabolic encephalopathy at that time. -Patient seen by neurology on 02/11/2021 admission with work-up including head CT which was normal, MRI with no acute abnormalities, EEG done consistent with toxic metabolic encephalopathy.  During that hospitalization patient noted to have hyperammonemia. -Ammonia levels within normal  limits. -Urinalysis concerning for UTI. -Urine cultures pending. -Patient noted to have had a significant hypoglycemic event prior to admission and placed on D10 infusion. -CBGs improved and as such discontinue D10. -Patient with no focal neurological deficits at this time and no further neurological work-up needed. -Continue daily lactulose. -Continue treatment for hypoglycemia and UTI. -Follow.  2.  Hypoglycemia/insulin-dependent diabetes mellitus -Patient noted to have a profound hypoglycemia on presentation. -Likely secondary to ongoing insulin administration with decreased oral intake. -During last hospitalization 02/22/2021-02/27/2021 patient's insulin was adjusted on discharge. -CBG improved on D10. -Discontinue D10. -Hold long-acting insulin. -Sliding scale insulin. -Continue CBG every 4 hours.  3.  Hypothermia -Likely secondary to profound hypoglycemia and UTI. -Urine cultures pending. -No longer requiring Bair hugger. -Continue empiric IV Zosyn for UTI. -Follow.  4.  Acute right-sided chest wall pain -Likely musculoskeletal in nature secondary to subacute rib fracture. -Lidoderm patch ordered yesterday but not placed. -Place Lidoderm patch. -K pad as needed. -Incentive spirometry. -Pain management.  5.  UTI -Hospitalization patient noted to have enterococcal UTI, was initially on ampicillin and vancomycin, subsequently transitioned to IV Unasyn, and discharged home on Augmentin to complete a 7-day course of treatment. -Urine cultures pending -IV Zosyn.  6.  Paroxysmal atrial fibrillation -Currently in NSR. -Continue beta-blocker for rate control. -Not a anticoagulation candidate due to increased risk of falls.  7.  Hypertension -Blood pressure soft. -Noted to have a history of chronic hypotension in the past requiring midodrine however seems to be acceptable at this time. -Discontinue Norvasc. -Increase IV fluids to 125 cc/h. -Follow.  8.   Hypothyroidism -TSH within normal limits. -Outpatient follow-up.  9.  Hyperlipidemia  10.  Vitamin B12 deficiency -Continue oral vitamin B12 supplementation.  11.  Chronic kidney disease stage IIIa -Stable.  12.  Depression/anxiety  -Zoloft.  13.  Liver cirrhosis -Hospitalization patient noted to have hyperammonemia. -CT chest which was done last March with evidence of cirrhosis, acute hepatitis panel negative, no breathing or alcohol abuse. -Per admitting physician is stated that patient told her he had a history of [place]. -Continue lactulose. -Outpatient follow-up with GI.   DVT prophylaxis: Lovenox Code Status: Full Family Communication: Updated patient.  No family at bedside. Disposition:   Status is: Inpatient  Remains inpatient appropriate because:Altered mental status  Dispo: The patient is from: Home              Anticipated d/c is to:  TBD              Patient currently is not medically stable to d/c.   Difficult to place patient No       Consultants:  None  Procedures:  CT head 03/04/2021 Chest x-ray 03/04/2021 Rib films 03/04/2021   Antimicrobials:  IV Zosyn 03/04/2021   Subjective: Alert to self only.  States still feels confused.  Complaining of right-sided chest pain and also pleuritic right-sided chest pain.  No nausea or vomiting.  Complain of some abdominal discomfort.  Denies any dysuria.   Objective: Vitals:   03/05/21 0432 03/05/21 0719 03/05/21 1111 03/05/21 1558  BP: (!) 96/49 (!) 124/57 (!) 111/58 (!) 108/46  Pulse: 68 66 78 70  Resp: 16 16 16 16   Temp: 98 F (36.7 C) 97.9 F (36.6 C) 97.7 F (36.5 C) 98.1 F (36.7 C)  TempSrc:  Oral Oral   SpO2: 98% 99% 99% 100%  Weight:      Height:        Intake/Output Summary (Last 24 hours) at 03/05/2021 1644 Last data filed at 03/05/2021 1018 Gross per 24 hour  Intake 907.5 ml  Output 1100 ml  Net -192.5 ml   Filed Weights   03/04/21 0554  Weight: 63.5 kg     Examination:  General exam: Appears calm and comfortable  Respiratory system: Clear to auscultation.  Splintering.  Poor to fair air movement.  No wheezing.  No crackles.  Respiratory effort normal. Cardiovascular system: S1 & S2 heard, RRR. No JVD, murmurs, rubs, gallops or clicks. No pedal edema.  Right chest wall tender to palpation. Gastrointestinal system: Abdomen is nondistended, soft and nontender. No organomegaly or masses felt. Normal bowel sounds heard. Central nervous system: Alert and oriented to self only.. No focal neurological deficits.  Moving extremities spontaneously. Extremities: Symmetric 5 x 5 power. Skin: No rashes, lesions or ulcers Psychiatry: Judgement and insight appear poor to fair.  Mood & affect appropriate.     Data Reviewed: I have personally reviewed following labs and imaging studies  CBC: Recent Labs  Lab 03/04/21 0559  WBC 4.7  NEUTROABS 3.5  HGB 10.9*  HCT 34.2*  MCV 88.4  PLT 94*    Basic Metabolic Panel: Recent Labs  Lab 03/04/21 0559 03/04/21 0931 03/05/21 0455  NA 140  --  136  K 3.2*  --  5.2*  CL 107  --  106  CO2 27  --  24  GLUCOSE 70  --  232*  BUN 14  --  18  CREATININE 1.02*  --  1.29*  CALCIUM 8.4*  --  8.3*  MG  --  2.0  --     GFR: Estimated Creatinine Clearance: 37.1 mL/min (A) (by C-G formula based on SCr of 1.29 mg/dL (  H)).  Liver Function Tests: Recent Labs  Lab 03/04/21 0559  AST 66*  ALT 62*  ALKPHOS 142*  BILITOT 1.0  PROT 6.4*  ALBUMIN 2.9*    CBG: Recent Labs  Lab 03/04/21 2342 03/05/21 0428 03/05/21 0721 03/05/21 1108 03/05/21 1552  GLUCAP 92 193* 216* 209* 213*     Recent Results (from the past 240 hour(s))  Urine Culture     Status: Abnormal   Collection Time: 02/24/21  8:56 AM   Specimen: Urine, Random  Result Value Ref Range Status   Specimen Description   Final    URINE, RANDOM Performed at [facility], [address], [place], [place] [address]    Special  Requests   Final    NONE Performed at [facility], [address] [place], [place] [address]    Culture MULTIPLE SPECIES PRESENT, SUGGEST RECOLLECTION (A)  Final   Report Status 02/25/2021 FINAL  Final  Expectorated Sputum Assessment w Gram Stain, Rflx to Resp Cult     Status: None   Collection Time: 02/27/21  9:41 AM   Specimen: Expectorated Sputum  Result Value Ref Range Status   Specimen Description EXPECTORATED SPUTUM  Final   Special Requests NONE  Final   Sputum evaluation   Final    Sputum specimen not acceptable for testing.  Please recollect.   Lavon Paganini, RN [phone] 02/27/21 [facility] Performed at [facility], [address] [place], [place] [address]    Report Status 02/27/2021 FINAL  Final  MRSA PCR Screening     Status: None   Collection Time: 02/27/21  9:41 AM  Result Value Ref Range Status   MRSA by PCR NEGATIVE NEGATIVE Final    Comment:        The GeneXpert MRSA Assay (FDA approved for NASAL specimens only), is one component of a comprehensive MRSA colonization surveillance program. It is not intended to diagnose MRSA infection nor to guide or monitor treatment for MRSA infections. Performed at [facility], [address] [place], [place] [address]   Resp Panel by RT-PCR (Flu A&B, Covid) Nasopharyngeal Swab     Status: None   Collection Time: 03/04/21  5:59 AM   Specimen: Nasopharyngeal Swab; Nasopharyngeal(NP) swabs in vial transport medium  Result Value Ref Range Status   SARS Coronavirus 2 by RT PCR NEGATIVE NEGATIVE Final    Comment: (NOTE) SARS-CoV-2 target nucleic acids are NOT DETECTED.  The SARS-CoV-2 RNA is generally detectable in upper respiratory specimens during the acute phase of infection. The lowest concentration of SARS-CoV-2 viral copies this assay can detect is 138 copies/mL. A negative result does not preclude SARS-Cov-2 infection and should not be used as the sole basis for treatment or other patient  management decisions. A negative result may occur with  improper specimen collection/handling, submission of specimen other than nasopharyngeal swab, presence of viral mutation(s) within the areas targeted by this assay, and inadequate number of viral copies(<138 copies/mL). A negative result must be combined with clinical observations, patient history, and epidemiological information. The expected result is Negative.  Fact Sheet for Patients:  EntrepreneurPulse.com.au  Fact Sheet for Healthcare Providers:  IncredibleEmployment.be  This test is no t yet approved or cleared by the [place] FDA and  has been authorized for detection and/or diagnosis of SARS-CoV-2 by FDA under an Emergency Use Authorization (EUA). This EUA will remain  in effect (meaning this test can be used) for the duration of the COVID-19 declaration under Section 564(b)(1)  of the Act, 21 U.S.C.section 360bbb-3(b)(1), unless the authorization is terminated  or revoked sooner.       Influenza A by PCR NEGATIVE NEGATIVE Final   Influenza B by PCR NEGATIVE NEGATIVE Final    Comment: (NOTE) The Xpert Xpress SARS-CoV-2/FLU/RSV plus assay is intended as an aid in the diagnosis of influenza from Nasopharyngeal swab specimens and should not be used as a sole basis for treatment. Nasal washings and aspirates are unacceptable for Xpert Xpress SARS-CoV-2/FLU/RSV testing.  Fact Sheet for Patients: EntrepreneurPulse.com.au  Fact Sheet for Healthcare Providers: IncredibleEmployment.be  This test is not yet approved or cleared by the [place] FDA and has been authorized for detection and/or diagnosis of SARS-CoV-2 by FDA under an Emergency Use Authorization (EUA). This EUA will remain in effect (meaning this test can be used) for the duration of the COVID-19 declaration under Section 564(b)(1) of the Act, 21 U.S.C. section 360bbb-3(b)(1),  unless the authorization is terminated or revoked.  Performed at [facility], [address], [place], [place] [address]   Urine culture     Status: None   Collection Time: 03/04/21  5:59 AM   Specimen: Urine, Random  Result Value Ref Range Status   Specimen Description   Final    URINE, RANDOM Performed at [facility], [address] [place], [place] [address]    Special Requests   Final    NONE Performed at [facility], [address], [place], [place] [address]    Culture   Final    NO GROWTH Performed at [facility], [place] [address] [place], [place] [address]    Report Status 03/05/2021 FINAL  Final  Blood culture (routine single)     Status: None (Preliminary result)   Collection Time: 03/04/21 10:14 AM   Specimen: BLOOD  Result Value Ref Range Status   Specimen Description BLOOD LEFT ANTECUBITAL  Final   Special Requests   Final    BOTTLES DRAWN AEROBIC AND ANAEROBIC Blood Culture results may not be optimal due to an excessive volume of blood received in culture bottles   Culture   Final    NO GROWTH < 24 HOURS Performed at [facility], [address] [place], [place] [address]    Report Status PENDING  Incomplete         Radiology Studies: DG Ribs Unilateral Right  Result Date: 03/04/2021 CLINICAL DATA:  Possible fall, right rib pain EXAM: RIGHT RIBS - 2 VIEW COMPARISON:  CT chest, 02/22/2021 FINDINGS: No acute displaced fractures of the right ribs are identified. There are redemonstrated, subacute, partially callused fractures of the right lateral eighth through tenth ribs. No pneumothorax or pleural effusion. IMPRESSION: No acute displaced fractures of the right ribs are identified. There are redemonstrated, subacute, partially callused fractures of the right lateral eighth through tenth ribs. Electronically Signed   By: Eddie Candle M.D.   On: 03/04/2021 12:34   CT Head Wo Contrast  Result Date:  03/04/2021 CLINICAL DATA:  65 y.o. female with past medical history of hypertension, diabetes, cirrhosis, CKD, atrial fibrillation, and chronic pain syndrome who presents to the ED for hyperglycemia. History is limited due to patient's altered mental status. Per EMS, patient was found by her husband to be less responsive than usual, when EMS arrived they found her blood sugar to be 27. She was subsequently given D10 and blood sugar improved to 116. EMS reports that patient became more awake and alert, but remains confused.  Patient endorses pain in her chest as well as her back, states these have been going on for a long time. She denies any fevers, cough, shortness of breath, nausea, vomiting, abdominal pain, diarrhea, dysuria, or hematuria. She is not sure when her last meal was or when she last took insulin. EXAM: CT HEAD WITHOUT CONTRAST TECHNIQUE: Contiguous axial images were obtained from the base of the skull through the vertex without intravenous contrast. COMPARISON:  02/22/2021 FINDINGS: Brain: No evidence of acute infarction, hemorrhage, hydrocephalus, extra-axial collection or mass lesion/mass effect. There is ventricular and sulcal enlargement reflecting age related volume loss. Mild patchy white matter hypoattenuation is also noted consistent with chronic microvascular ischemic change. These findings are stable. Vascular: No hyperdense vessel or unexpected calcification. Skull: Normal. Negative for fracture or focal lesion. Sinuses/Orbits: Globes and orbits are unremarkable. Trace amount of dependent fluid in the right maxillary sinus. Minor right ethmoid, sphenoid and inferior right frontal sinus mucosal thickening. Other: None. IMPRESSION: 1. No acute intracranial abnormalities. 2. Age related volume loss and mild chronic microvascular ischemic change. 3. Mild sinus disease improved compared to the prior CT. Electronically Signed   By: Lajean Manes M.D.   On: 03/04/2021 10:33   DG Chest Portable 1  View  Result Date: 03/04/2021 CLINICAL DATA:  Hypoglycemia. Confused patient. Chest and back pain. EXAM: PORTABLE CHEST 1 VIEW COMPARISON:  02/24/2021 and older studies. FINDINGS: Cardiac silhouette is normal in size.  No mediastinal hilar masses. Clear lungs.  No convincing pleural effusion or pneumothorax. Skeletal structures are grossly intact. IMPRESSION: No active disease. Electronically Signed   By: Lajean Manes M.D.   On: 03/04/2021 10:30        Scheduled Meds:  amLODipine  10 mg Oral Daily   carvedilol  3.125 mg Oral BID WC   docusate sodium  100 mg Oral Daily   enoxaparin (LOVENOX) injection  40 mg Subcutaneous Q24H   insulin aspart  0-15 Units Subcutaneous Q4H   lactulose  20 g Oral Daily   lidocaine  1 patch Transdermal Q24H   polyethylene glycol  17 g Oral BID   sertraline  50 mg Oral Daily   vitamin B-12  1,000 mcg Oral Daily   Continuous Infusions:  sodium chloride 125 mL/hr at 03/05/21 1515   piperacillin-tazobactam (ZOSYN)  IV 3.375 g (03/05/21 0942)     LOS: 1 day    Time spent: 35 minutes    Irine Seal, MD [facility]   To contact the attending provider between 7A-7P or the covering provider during after hours 7P-7A, please log into the web site [email or web address] and access using universal [place] password for that web site. If you do not have the password, please call the hospital operator.  03/05/2021, 4:44 PM

## 2021-03-06 LAB — BASIC METABOLIC PANEL
Anion gap: 5 (ref 5–15)
BUN: 19 mg/dL (ref 8–23)
CO2: 23 mmol/L (ref 22–32)
Calcium: 7.9 mg/dL — ABNORMAL LOW (ref 8.9–10.3)
Chloride: 108 mmol/L (ref 98–111)
Creatinine, Ser: 1.4 mg/dL — ABNORMAL HIGH (ref 0.44–1.00)
GFR, Estimated: 41 mL/min — ABNORMAL LOW (ref >60)
Glucose, Bld: 197 mg/dL — ABNORMAL HIGH (ref 70–99)
Potassium: 4.5 mmol/L (ref 3.5–5.1)
Sodium: 136 mmol/L (ref 135–145)

## 2021-03-06 LAB — GLUCOSE, CAPILLARY
Glucose-Capillary: 141 mg/dL — ABNORMAL HIGH (ref 70–99)
Glucose-Capillary: 166 mg/dL — ABNORMAL HIGH (ref 70–99)
Glucose-Capillary: 177 mg/dL — ABNORMAL HIGH (ref 70–99)
Glucose-Capillary: 183 mg/dL — ABNORMAL HIGH (ref 70–99)
Glucose-Capillary: 195 mg/dL — ABNORMAL HIGH (ref 70–99)
Glucose-Capillary: 214 mg/dL — ABNORMAL HIGH (ref 70–99)

## 2021-03-06 LAB — CBC WITH DIFFERENTIAL/PLATELET
Abs Immature Granulocytes: 0.01 10*3/uL (ref 0.00–0.07)
Basophils Absolute: 0 10*3/uL (ref 0.0–0.1)
Basophils Relative: 0 %
Eosinophils Absolute: 0.2 10*3/uL (ref 0.0–0.5)
Eosinophils Relative: 3 %
HCT: 28.1 % — ABNORMAL LOW (ref 36.0–46.0)
Hemoglobin: 9.1 g/dL — ABNORMAL LOW (ref 12.0–15.0)
Immature Granulocytes: 0 %
Lymphocytes Relative: 16 %
Lymphs Abs: 0.9 10*3/uL (ref 0.7–4.0)
MCH: 28.4 pg (ref 26.0–34.0)
MCHC: 32.4 g/dL (ref 30.0–36.0)
MCV: 87.8 fL (ref 80.0–100.0)
Monocytes Absolute: 0.3 10*3/uL (ref 0.1–1.0)
Monocytes Relative: 6 %
Neutro Abs: 4.3 10*3/uL (ref 1.7–7.7)
Neutrophils Relative %: 75 %
Platelets: 110 10*3/uL — ABNORMAL LOW (ref 150–400)
RBC: 3.2 MIL/uL — ABNORMAL LOW (ref 3.87–5.11)
RDW: 16.4 % — ABNORMAL HIGH (ref 11.5–15.5)
WBC: 5.8 10*3/uL (ref 4.0–10.5)
nRBC: 0 % (ref 0.0–0.2)

## 2021-03-06 MED ORDER — INSULIN ASPART 100 UNIT/ML IJ SOLN
3.0000 [IU] | Freq: Three times a day (TID) | INTRAMUSCULAR | Status: DC
  Administered 2021-03-07 (×2): 3 [IU] via SUBCUTANEOUS
  Filled 2021-03-06 (×2): qty 1

## 2021-03-06 MED ORDER — INSULIN ASPART 100 UNIT/ML IJ SOLN: 0.0000 [IU] | Freq: Three times a day (TID) | INTRAMUSCULAR | Status: DC

## 2021-03-06 MED ORDER — AMOXICILLIN-POT CLAVULANATE 875-125 MG PO TABS
1.0000 | ORAL_TABLET | Freq: Two times a day (BID) | ORAL | Status: DC
  Administered 2021-03-07: 10:00:00 1 via ORAL
  Filled 2021-03-06: qty 1

## 2021-03-06 MED ORDER — INSULIN ASPART 100 UNIT/ML IJ SOLN
0.0000 [IU] | Freq: Three times a day (TID) | INTRAMUSCULAR | Status: DC
  Administered 2021-03-06: 3 [IU] via SUBCUTANEOUS
  Administered 2021-03-06: 16:00:00 5 [IU] via SUBCUTANEOUS
  Administered 2021-03-06: 2 [IU] via SUBCUTANEOUS
  Administered 2021-03-07: 12:00:00 5 [IU] via SUBCUTANEOUS
  Administered 2021-03-07: 10:00:00 3 [IU] via SUBCUTANEOUS
  Filled 2021-03-06 (×5): qty 1

## 2021-03-06 NOTE — Progress Notes (Addendum)
PROGRESS NOTE    [name]  MAU:[id number] DOB: 1956-02-02 DOA: 03/04/2021 PCP: [name], NP    Chief Complaint  Patient presents with   Hypoglycemia    Brief Narrative:  Patient is 65 year old female history of hypertension, diabetes, cirrhosis secondary to [facility], CKD, paroxysmal A. fib, CKD 3, B12 deficiency and chronic pain recently hospitalized 02/22/2021-02/27/2021 for acute encephalopathy felt secondary to enterococcal UTI sepsis and hypoglycemia presenting to the ED with altered mental status noted to have an initial CBG in the 20s and received D10 bolus.  On arrival to the ED patient noted to be hypothermic with rectal temperature of 92.8, urinalysis concerning for UTI.  Head CT unremarkable.  Chest x-ray with no acute changes.  Patient placed on IV Zosyn, D10, admitted and followed.   Assessment & Plan:   Principal Problem:   Acute encephalopathy Active Problems:   GERD   Essential hypertension   Atrial fibrillation (HCC)   Acute lower UTI   PAF (paroxysmal atrial fibrillation) (HCC)   Type 2 diabetes mellitus with neurologic complication (HCC)   Mixed hyperlipidemia   Hypothyroidism, unspecified   Chronic generalized pain   GAD (generalized anxiety disorder)   Cirrhosis of liver not due to alcohol ([place])   Hypothermia   Hypoglycemia associated with diabetes ([place])   Acute cystitis with hematuria   Right-sided chest wall pain   1 acute metabolic encephalopathy -Secondary to hypoglycemia, probable UTI. -Patient recently hospitalized 02/22/2021-02/27/2021 for acute encephalopathy secondary to Enterococcus UTI sepsis and hypoglycemia. -Patient admitted 02/11/2021 and 02/22/2021 with acute metabolic encephalopathy at that time. -Patient seen by neurology on 02/11/2021 admission with work-up including head CT which was normal, MRI with no acute abnormalities, EEG done consistent with toxic metabolic encephalopathy.  During that hospitalization patient noted to have  hyperammonemia. -Ammonia levels within normal limits. -Urinalysis concerning for UTI. -Urine cultures with no growth to date. -Patient noted to have had a significant hypoglycemic event prior to admission and placed on D10 infusion. -CBGs improved and as such D10 discontinued. -Patient with no focal neurological deficits at this time and no further neurological work-up needed. -Continue daily lactulose. -Continue treatment for hypoglycemia and UTI. -Follow.  2.  Hypoglycemia/insulin-dependent diabetes mellitus -Patient noted to have a profound hypoglycemia on presentation. -Likely secondary to ongoing insulin administration with decreased oral intake. -During last hospitalization 02/22/2021-02/27/2021 patient's insulin was adjusted on discharge. -CBG improved on D10. -D10 discontinued. -Hemoglobin A1c 9.0.(02/26/2021). -CBG 166 this morning.   -Continue to hold long-acting insulin.   -Placed on meal coverage NovoLog 3 units daily if consumes > 50% of meals.   -Change CBG to AC at bedtime.   -SSI.    3.  Hypothermia -Likely secondary to profound hypoglycemia and UTI. -Urine cultures pending. -No longer requiring Bair hugger. -Continue empiric IV Zosyn for UTI. -Follow.  4.  Acute right-sided chest wall pain -Likely musculoskeletal in nature secondary to subacute rib fracture. -Lidoderm patch ordered yesterday but not placed. -Place Lidoderm patch. -K pad as needed. -Incentive spirometry. -Pain management.  5.  UTI -Hospitalization patient noted to have enterococcal UTI, was initially on ampicillin and vancomycin, subsequently transitioned to IV Unasyn, and discharged home on Augmentin to complete a 7-day course of treatment. -Urine cultures negative.   -We will transition from IV Zosyn to Augmentin to complete a 5-day course of treatment.    6.  Paroxysmal atrial fibrillation -Currently in NSR. -Continue beta-blocker for rate control. -Not a anticoagulation candidate due to  increased risk of  falls.  7.  Hypertension -Blood pressure soft on admission. -Noted to have a history of chronic hypotension in the past requiring midodrine however seems to be acceptable at this time. -Norvasc discontinued.  -Continue Coreg. -Blood pressure improved.   -Saline lock IV fluids.   8.  Hypothyroidism -TSH within normal limits. -Outpatient follow-up.  9.  Hyperlipidemia  10.  Vitamin B12 deficiency -Vitamin B12 supplementation.    11.  Chronic kidney disease stage IIIa -Stable.  12.  Depression/anxiety  -Stable.   -Continue Zoloft.    13.  Liver cirrhosis -Hospitalization patient noted to have hyperammonemia. -CT chest which was done last March with evidence of cirrhosis, acute hepatitis panel negative, no breathing or alcohol abuse. -Per admitting physician is stated that patient told her he had a history of NASH. -Continue lactulose. -Outpatient follow-up with GI.  14.  Anemia -Patient with no overt bleeding. -Check an anemia panel. -Follow H&H.   DVT prophylaxis: Lovenox Code Status: Full Family Communication: Updated patient.  No family at bedside. Disposition:   Status is: Inpatient  Remains inpatient appropriate because:Altered mental status  Dispo: The patient is from: Home              Anticipated d/c is to:  TBD              Patient currently is not medically stable to d/c.   Difficult to place patient No       Consultants:  None  Procedures:  CT head 03/04/2021 Chest x-ray 03/04/2021 Rib films 03/04/2021   Antimicrobials:  IV Zosyn 03/04/2021>>>> 03/06/2021 Augmentin 03/06/2021   Subjective: Patient feeling much better today.  Denies any shortness of breath.  Denies any significant chest pain.  Still with some right-sided pleuritic pain which is slowly improving.  Alert and oriented to self place and time.  No nausea or vomiting.  Denies any dysuria.     Objective: Vitals:   03/06/21 0418 03/06/21 0842 03/06/21 1136  03/06/21 1603  BP: 129/62 (!) 112/99 111/70 127/61  Pulse: 83 85 70 81  Resp: 17 16 18 17   Temp: 98.6 F (37 C) 98.7 F (37.1 C) 98.3 F (36.8 C) 98.2 F (36.8 C)  TempSrc:      SpO2: 97% 97% 98% 97%  Weight:      Height:        Intake/Output Summary (Last 24 hours) at 03/06/2021 1817 Last data filed at 03/06/2021 1356 Gross per 24 hour  Intake 480 ml  Output 1200 ml  Net -720 ml    Filed Weights   03/04/21 0554  Weight: 63.5 kg    Examination:  General exam: NAD. Respiratory system: CTA B.  No wheezes, no crackles, no rhonchi.  Decreased breath sounds in the bases.  Poor to fair air movement.  Cardiovascular system: Regular rate rhythm no murmurs rubs or gallops.  No JVD.  No lower extremity edema.  Right chest wall tender to palpation. Gastrointestinal system: Abdomen is soft, nontender, nondistended, positive bowel sounds.  No rebound.  No guarding.  Central nervous system: Alert and oriented x3. No focal neurological deficits.  Moving extremities spontaneously. Extremities: Symmetric 5 x 5 power. Skin: No rashes, lesions or ulcers Psychiatry: Judgement and insight appear poor to fair.  Mood & affect appropriate.     Data Reviewed: I have personally reviewed following labs and imaging studies  CBC: Recent Labs  Lab 03/04/21 0559 03/06/21 0501  WBC 4.7 5.8  NEUTROABS 3.5 4.3  HGB 10.9* 9.1*  HCT 34.2* 28.1*  MCV 88.4 87.8  PLT 94* 110*     Basic Metabolic Panel: Recent Labs  Lab 03/04/21 0559 03/04/21 0931 03/05/21 0455 03/06/21 0501  NA 140  --  136 136  K 3.2*  --  5.2* 4.5  CL 107  --  106 108  CO2 27  --  24 23  GLUCOSE 70  --  232* 197*  BUN 14  --  18 19  CREATININE 1.02*  --  1.29* 1.40*  CALCIUM 8.4*  --  8.3* 7.9*  MG  --  2.0  --   --      GFR: Estimated Creatinine Clearance: 34.2 mL/min (A) (by C-G formula based on SCr of 1.4 mg/dL (H)).  Liver Function Tests: Recent Labs  Lab 03/04/21 0559  AST 66*  ALT 62*  ALKPHOS 142*   BILITOT 1.0  PROT 6.4*  ALBUMIN 2.9*     CBG: Recent Labs  Lab 03/06/21 0007 03/06/21 0420 03/06/21 0840 03/06/21 1135 03/06/21 1559  GLUCAP 195* 183* 166* 141* 214*      Recent Results (from the past 240 hour(s))  Expectorated Sputum Assessment w Gram Stain, Rflx to Resp Cult     Status: None   Collection Time: 02/27/21  9:41 AM   Specimen: Expectorated Sputum  Result Value Ref Range Status   Specimen Description EXPECTORATED SPUTUM  Final   Special Requests NONE  Final   Sputum evaluation   Final    Sputum specimen not acceptable for testing.  Please recollect.   Lavon Paganini, RN [phone] 02/27/21 [facility] Performed at [facility], [address] [place], [place] [address]    Report Status 02/27/2021 FINAL  Final  MRSA PCR Screening     Status: None   Collection Time: 02/27/21  9:41 AM  Result Value Ref Range Status   MRSA by PCR NEGATIVE NEGATIVE Final    Comment:        The GeneXpert MRSA Assay (FDA approved for NASAL specimens only), is one component of a comprehensive MRSA colonization surveillance program. It is not intended to diagnose MRSA infection nor to guide or monitor treatment for MRSA infections. Performed at [facility], [address] [place], [place] [address]   Resp Panel by RT-PCR (Flu A&B, Covid) Nasopharyngeal Swab     Status: None   Collection Time: 03/04/21  5:59 AM   Specimen: Nasopharyngeal Swab; Nasopharyngeal(NP) swabs in vial transport medium  Result Value Ref Range Status   SARS Coronavirus 2 by RT PCR NEGATIVE NEGATIVE Final    Comment: (NOTE) SARS-CoV-2 target nucleic acids are NOT DETECTED.  The SARS-CoV-2 RNA is generally detectable in upper respiratory specimens during the acute phase of infection. The lowest concentration of SARS-CoV-2 viral copies this assay can detect is 138 copies/mL. A negative result does not preclude SARS-Cov-2 infection and should not be used as the sole basis for treatment  or other patient management decisions. A negative result may occur with  improper specimen collection/handling, submission of specimen other than nasopharyngeal swab, presence of viral mutation(s) within the areas targeted by this assay, and inadequate number of viral copies(<138 copies/mL). A negative result must be combined with clinical observations, patient history, and epidemiological information. The expected result is Negative.  Fact Sheet for Patients:  EntrepreneurPulse.com.au  Fact Sheet for Healthcare Providers:  IncredibleEmployment.be  This test is no t yet approved or cleared by the [place] FDA and  has been authorized for detection and/or diagnosis of  SARS-CoV-2 by FDA under an Emergency Use Authorization (EUA). This EUA will remain  in effect (meaning this test can be used) for the duration of the COVID-19 declaration under Section 564(b)(1) of the Act, 21 U.S.C.section 360bbb-3(b)(1), unless the authorization is terminated  or revoked sooner.       Influenza A by PCR NEGATIVE NEGATIVE Final   Influenza B by PCR NEGATIVE NEGATIVE Final    Comment: (NOTE) The Xpert Xpress SARS-CoV-2/FLU/RSV plus assay is intended as an aid in the diagnosis of influenza from Nasopharyngeal swab specimens and should not be used as a sole basis for treatment. Nasal washings and aspirates are unacceptable for Xpert Xpress SARS-CoV-2/FLU/RSV testing.  Fact Sheet for Patients: EntrepreneurPulse.com.au  Fact Sheet for Healthcare Providers: IncredibleEmployment.be  This test is not yet approved or cleared by the [place] FDA and has been authorized for detection and/or diagnosis of SARS-CoV-2 by FDA under an Emergency Use Authorization (EUA). This EUA will remain in effect (meaning this test can be used) for the duration of the COVID-19 declaration under Section 564(b)(1) of the Act, 21 U.S.C. section  360bbb-3(b)(1), unless the authorization is terminated or revoked.  Performed at [facility], [address], [place], [place] [address]   Urine culture     Status: None   Collection Time: 03/04/21  5:59 AM   Specimen: Urine, Random  Result Value Ref Range Status   Specimen Description   Final    URINE, RANDOM Performed at [facility], [address], [place], [place] [address]    Special Requests   Final    NONE Performed at [facility], [address], [place], [place] [address]    Culture   Final    NO GROWTH Performed at [facility], [place] [address], [place], [place] [address]    Report Status 03/05/2021 FINAL  Final  Blood culture (routine single)     Status: None (Preliminary result)   Collection Time: 03/04/21 10:14 AM   Specimen: BLOOD  Result Value Ref Range Status   Specimen Description BLOOD LEFT ANTECUBITAL  Final   Special Requests   Final    BOTTLES DRAWN AEROBIC AND ANAEROBIC Blood Culture results may not be optimal due to an excessive volume of blood received in culture bottles   Culture   Final    NO GROWTH 2 DAYS Performed at [facility], [address] [place], [place] [address]    Report Status PENDING  Incomplete          Radiology Studies: No results found.      Scheduled Meds:  carvedilol  3.125 mg Oral BID WC   docusate sodium  100 mg Oral Daily   enoxaparin (LOVENOX) injection  40 mg Subcutaneous Q24H   insulin aspart  0-15 Units Subcutaneous TID WC   lactulose  20 g Oral Daily   lidocaine  1 patch Transdermal Q24H   polyethylene glycol  17 g Oral BID   sertraline  50 mg Oral Daily   vitamin B-12  1,000 mcg Oral Daily   Continuous Infusions:  sodium chloride 125 mL/hr at 03/06/21 1309   sodium chloride Stopped (03/05/21 1721)   piperacillin-tazobactam (ZOSYN)  IV 3.375 g (03/06/21 1814)     LOS: 2 days    Time spent: 35 minutes    Irine Seal, MD [facility]   To contact the attending provider between 7A-7P or the covering provider during after hours 7P-7A, please log into the  web site [email or web address] and access using universal [place] password for that web site. If you do not have the password, please call the hospital operator.  03/06/2021, 6:17 PM

## 2021-03-06 NOTE — Progress Notes (Signed)
Inpatient Diabetes Program Recommendations  AACE/ADA: New Consensus Statement on Inpatient Glycemic Control   Target Ranges:  Prepandial:   less than 140 mg/dL      Peak postprandial:   less than 180 mg/dL (1-2 hours)      Critically ill patients:  140 - 180 mg/dL   Results for [name], [name] (MRN [id number]) as of 03/06/2021 10:25  Ref. Range 03/05/2021 07:21 03/05/2021 11:08 03/05/2021 15:52 03/05/2021 19:29 03/06/2021 00:07 03/06/2021 04:20 03/06/2021 08:40  Glucose-Capillary Latest Ref Range: 70 - 99 mg/dL 216 (H) 209 (H) 213 (H) 202 (H) 195 (H) 183 (H) 166 (H)   Review of Glycemic Control  Diabetes history: DM2 Outpatient Diabetes medications: Levemir 30 units QAM, Levemir 18 units QHS, Humalog 10 units TID with meals Current orders for Inpatient glycemic control: Novolog 0-15 units TID with meals  Inpatient Diabetes Program Recommendations:    Insulin: Please consider ordering Novolog 3 units TID with meals for meal coverage if patient eats at least 50% of meals.  Thanks, Barnie Alderman, RN, MSN, CDE Diabetes Coordinator Inpatient Diabetes Program [phone] (Team Pager from 8am to 5pm)

## 2021-03-07 ENCOUNTER — Other Ambulatory Visit: Payer: Self-pay

## 2021-03-07 DIAGNOSIS — K219 Gastro-esophageal reflux disease without esophagitis: Secondary | ICD-10-CM

## 2021-03-07 LAB — CBC WITH DIFFERENTIAL/PLATELET
Abs Immature Granulocytes: 0.01 10*3/uL (ref 0.00–0.07)
Basophils Absolute: 0 10*3/uL (ref 0.0–0.1)
Basophils Relative: 0 %
Eosinophils Absolute: 0.2 10*3/uL (ref 0.0–0.5)
Eosinophils Relative: 3 %
HCT: 28.8 % — ABNORMAL LOW (ref 36.0–46.0)
Hemoglobin: 9.4 g/dL — ABNORMAL LOW (ref 12.0–15.0)
Immature Granulocytes: 0 %
Lymphocytes Relative: 16 %
Lymphs Abs: 1 10*3/uL (ref 0.7–4.0)
MCH: 28.4 pg (ref 26.0–34.0)
MCHC: 32.6 g/dL (ref 30.0–36.0)
MCV: 87 fL (ref 80.0–100.0)
Monocytes Absolute: 0.4 10*3/uL (ref 0.1–1.0)
Monocytes Relative: 6 %
Neutro Abs: 4.4 10*3/uL (ref 1.7–7.7)
Neutrophils Relative %: 75 %
Platelets: 124 10*3/uL — ABNORMAL LOW (ref 150–400)
RBC: 3.31 MIL/uL — ABNORMAL LOW (ref 3.87–5.11)
RDW: 16.6 % — ABNORMAL HIGH (ref 11.5–15.5)
WBC: 6 10*3/uL (ref 4.0–10.5)
nRBC: 0 % (ref 0.0–0.2)

## 2021-03-07 LAB — BASIC METABOLIC PANEL
Anion gap: 4 — ABNORMAL LOW (ref 5–15)
BUN: 20 mg/dL (ref 8–23)
CO2: 25 mmol/L (ref 22–32)
Calcium: 8.5 mg/dL — ABNORMAL LOW (ref 8.9–10.3)
Chloride: 109 mmol/L (ref 98–111)
Creatinine, Ser: 1.49 mg/dL — ABNORMAL HIGH (ref 0.44–1.00)
GFR, Estimated: 38 mL/min — ABNORMAL LOW (ref >60)
Glucose, Bld: 216 mg/dL — ABNORMAL HIGH (ref 70–99)
Potassium: 5 mmol/L (ref 3.5–5.1)
Sodium: 138 mmol/L (ref 135–145)

## 2021-03-07 LAB — GLUCOSE, CAPILLARY
Glucose-Capillary: 73 mg/dL (ref 70–99)
Glucose-Capillary: 187 mg/dL — ABNORMAL HIGH (ref 70–99)
Glucose-Capillary: 228 mg/dL — ABNORMAL HIGH (ref 70–99)

## 2021-03-07 LAB — FERRITIN: Ferritin: 12 ng/mL (ref 11–307)

## 2021-03-07 LAB — IRON AND TIBC
Iron: 45 ug/dL (ref 28–170)
Saturation Ratios: 10 % — ABNORMAL LOW (ref 10.4–31.8)
TIBC: 444 ug/dL (ref 250–450)
UIBC: 399 ug/dL

## 2021-03-07 LAB — VITAMIN B12: Vitamin B-12: 646 pg/mL (ref 180–914)

## 2021-03-07 LAB — FOLATE: Folate: 6.9 ng/mL (ref >5.9)

## 2021-03-07 MED ORDER — LEVEMIR FLEXTOUCH 100 UNIT/ML ~~LOC~~ SOPN
6.0000 [IU] | PEN_INJECTOR | Freq: Every day | SUBCUTANEOUS | 0 refills | Status: DC
  Filled 2021-03-07: qty 15, 250d supply, fill #0

## 2021-03-07 MED ORDER — INSULIN LISPRO (1 UNIT DIAL) 100 UNIT/ML (KWIKPEN)
3.0000 [IU] | PEN_INJECTOR | Freq: Three times a day (TID) | SUBCUTANEOUS | 1 refills | Status: DC
  Filled 2021-03-07: qty 15, 30d supply, fill #0

## 2021-03-07 MED ORDER — INSULIN DETEMIR 100 UNIT/ML ~~LOC~~ SOLN
6.0000 [IU] | Freq: Every day | SUBCUTANEOUS | Status: DC
  Administered 2021-03-07: 10:00:00 6 [IU] via SUBCUTANEOUS
  Filled 2021-03-07: qty 0.06

## 2021-03-07 MED ORDER — LIDOCAINE 5 % EX PTCH
1.0000 | MEDICATED_PATCH | CUTANEOUS | 0 refills | Status: AC
  Filled 2021-03-07: qty 15, 15d supply, fill #0

## 2021-03-07 MED ORDER — AMOXICILLIN-POT CLAVULANATE 875-125 MG PO TABS
1.0000 | ORAL_TABLET | Freq: Two times a day (BID) | ORAL | 0 refills | Status: AC
  Filled 2021-03-07: qty 8, 4d supply, fill #0

## 2021-03-07 NOTE — Plan of Care (Signed)

## 2021-03-07 NOTE — Discharge Summary (Signed)
Physician Discharge Summary  [name] HAL:[id number] DOB: 27-Aug-1956 DOA: 03/04/2021  PCP: [name], NP  Admit date: 03/04/2021 Discharge date: 03/07/2021  Time spent: 55 minutes  Recommendations for Outpatient Follow-up:  Follow-up with [name], NP in 1 to 2 weeks.  On follow-up patient will need a CBC done to follow-up on H&H.  Patient will need a basic metabolic profile done to follow-up on electrolytes and renal function.  Patient's diabetes will need to be reassessed as Levemir dose has been decreased to 6 units daily, patient also on sliding scale insulin.  Patient blood pressure also needs to be reassessed. Patient to be discharged home with home health therapies   Discharge Diagnoses:  Principal Problem:   Acute encephalopathy Active Problems:   GERD   Essential hypertension   Atrial fibrillation (HCC)   Acute lower UTI   PAF (paroxysmal atrial fibrillation) (HCC)   Type 2 diabetes mellitus with neurologic complication (HCC)   Mixed hyperlipidemia   Hypothyroidism, unspecified   Chronic generalized pain   GAD (generalized anxiety disorder)   Cirrhosis of liver not due to alcohol ([place])   Hypothermia   Hypoglycemia associated with diabetes ([place])   Acute cystitis with hematuria   Right-sided chest wall pain   Discharge Condition: Stable and improved  Diet recommendation: Carb modified diet  Filed Weights   03/04/21 0554  Weight: 63.5 kg    History of present illness:  HPI per Dr. Stoney Bang is a 65 y.o. female with medical history significant of HTN, DM, cirrhosis 2/2 NASH, CKD, PAF, CKD 3, B12 deficiency  and chronic pain with recent admission 02/22/21 and discharged on 6/20. Dx encephalopathy thought to be secondary to enterococcal UTI sepsis and hypoglycemia. This AM husband noted altered mentation. EMS activated by husband.  Patient found to be hypoglycemic initially with glucose in the 20s and received D10 bolus with  subsequent Glucose of 116.     ED Course: T 92.8 R  129/81  58  18. On arrival to emergency room she is noted to be hypothermic with a initial rectal temp of 92.8.  Otherwise she is afebrile with stable vital signs on room air.  neuro exam without focal findings. No evidence of trauma on exam. Lab: Cr 1.02, Albumin 2.9 (l), AST 66, ALT 62, WBC 4.7, Hgb 10.9, Plt 94 Nl differential. U/A: Hazy, LE large, micro with rare bacteria, 21-50 WBC. CT head -per EDP (no reading radiologist available) w/o acute hemorrhage or injury. CXR per EDP w/o acute changes. Patient given IV Zosyn. TRH called to admit for continued tx and evaluation.     Hospital Course:  1 acute metabolic encephalopathy -Secondary to hypoglycemia, probable UTI. -Patient recently hospitalized 02/22/2021-02/27/2021 for acute encephalopathy secondary to Enterococcus UTI sepsis and hypoglycemia. -Patient admitted 02/11/2021 and 02/22/2021 with acute metabolic encephalopathy at that time. -Patient seen by neurology on 02/11/2021 admission with work-up including head CT which was normal, MRI with no acute abnormalities, EEG done consistent with toxic metabolic encephalopathy.  During that hospitalization patient noted to have hyperammonemia. -Ammonia levels within normal limits. -Urinalysis concerning for UTI. -Urine cultures with no growth to date. -Patient noted to have had a significant hypoglycemic event prior to admission and placed on D10 infusion. -CBGs improved and as such D10 discontinued. -Patient with no focal neurological deficits at this time and no further neurological work-up needed. -Patient maintained on daily lactulose. -Patient improved clinically and was back to baseline by day of discharge.  On day of discharge patient was alert and oriented to self place and time.   -Patient be discharged home on a decreased dose of long-acting insulin, 4 more days of oral antibiotics to complete a 7-day course of antibiotic treatment.    -Medical compliance stressed to patient.   -Outpatient follow-up with PCP.    2.  Hypoglycemia/insulin-dependent diabetes mellitus -Patient noted to have a profound hypoglycemia on presentation. -Likely secondary to ongoing insulin administration with decreased oral intake. -During last hospitalization 02/22/2021-02/27/2021 patient's insulin was adjusted on discharge. -CBG improved on D10. -D10 discontinued. -Hemoglobin A1c 9.0.(02/26/2021). -Patient initially maintained on sliding scale insulin during the hospitalization and patient's home regimen long-acting insulin held throughout the hospitalization.   -On day of discharge as CBGs had improved patient started on Levemir 6 units daily as well as NovoLog 3 units daily with meals as long as patient consumes > 50% of meals.   -Outpatient follow-up with PCP for further management.  3.  Hypothermia -Likely secondary to profound hypoglycemia and UTI. -Urine cultures with no growth to date.   -On presentation patient placed on the Bair hugger as well as empiric IV Zosyn for UTI.   -Hypothermia improved and had resolved by day of discharge.   -Patient with discharge home on 4 more days of oral antibiotics.   4.  Acute right-sided chest wall pain -Likely musculoskeletal in nature secondary to subacute rib fracture. -Lidoderm patch placed.   -Patient maintained on K pad as needed.   -Incentive spirometry ordered.   -Outpatient follow-up.  5.  UTI -Hospitalization patient noted to have enterococcal UTI, was initially on ampicillin and vancomycin, subsequently transitioned to IV Unasyn, and discharged home on Augmentin to complete a 7-day course of treatment. -Urine cultures negative.   -Medical compliance noted to be an issue in the past.   -Concerned that patient may not have completed course of antibiotic treatment on discharge during last hospitalization.   -Patient maintained on IV Zosyn during the hospitalization and subsequently  transition to Augmentin to complete a 7-day course of treatment.   -Patient will be discharged on 4 days of oral Augmentin.  Outpatient follow-up with PCP.   6.  Paroxysmal atrial fibrillation -Patient noted to be in normal sinus rhythm.   -Patient maintained on home regimen Coreg for rate control.  -Patient deemed not a anticoagulation candidate due to increased risk of falls.   7.  Hypertension -Blood pressure soft on admission. -Noted to have a history of chronic hypotension in the past requiring midodrine however seems to be acceptable at this time. -Norvasc discontinued.  -Patient maintained on Coreg during the hospitalization with good blood pressure control.   -Outpatient follow-up with PCP.    8.  Hypothyroidism -TSH within normal limits. -Outpatient follow-up.  9.  Hyperlipidemia  10.  Vitamin B12 deficiency -Patient maintained on Vitamin B12 supplementation.    11.  Chronic kidney disease stage IIIa -Stable.  12.  Depression/anxiety -Patient maintained on home regimen Zoloft. -Outpatient follow-up.    13.  Liver cirrhosis -Hospitalization patient noted to have hyperammonemia. -CT chest which was done last March with evidence of cirrhosis, acute hepatitis panel negative, no breathing or alcohol abuse. -Per admitting physician is stated that patient told her he had a history of NASH. -Patient maintained on lactulose.   -Outpatient follow-up with GI/PCP.   14.  Anemia -Patient with no overt bleeding. -Anemia panel consistent with anemia of chronic disease.   -H&H remained stable throughout the hospitalization.  Outpatient follow-up.  Procedures: CT head 03/04/2021 Chest x-ray 03/04/2021 Rib films 03/04/2021  Consultations: None  Discharge Exam: Vitals:   03/07/21 0750 03/07/21 1141  BP: (!) 130/51 128/62  Pulse: 84 82  Resp: 17 18  Temp: 97.8 F (36.6 C) 98 F (36.7 C)  SpO2: 97% 97%    General: NAD Cardiovascular: RRR no murmurs rubs or  gallops.  No JVD.  No lower extremity edema Respiratory: CTA B.  No wheezes, no crackles, no rhonchi  Discharge Instructions   Discharge Instructions     Diet Carb Modified   Complete by: As directed    Increase activity slowly   Complete by: As directed       Allergies as of 03/07/2021       Reactions   Latex Other (See Comments)   Pt had allergic reaction to tegaderm dressing on IV.  Caused blisters on the skin.   Aspirin Rash   Codeine Sulfate Rash   Erythromycin Rash, Other (See Comments)   Fever, also   Prednisone Swelling   Rosiglitazone Maleate Swelling   Tetanus-diphtheria Toxoids Td Rash, Other (See Comments)   Fever, also        Medication List     STOP taking these medications    amLODipine 10 MG tablet Commonly known as: NORVASC   amoxicillin-clavulanate 500-125 MG tablet Commonly known as: AUGMENTIN Replaced by: amoxicillin-clavulanate 875-125 MG tablet       TAKE these medications    amoxicillin-clavulanate 875-125 MG tablet Commonly known as: AUGMENTIN Take 1 tablet by mouth every 12 (twelve) hours for 4 days. Replaces: amoxicillin-clavulanate 500-125 MG tablet   carvedilol 3.125 MG tablet Commonly known as: COREG Take 1 tablet (3.125 mg total) by mouth 2 (two) times daily with a meal.   docusate sodium 100 MG capsule Commonly known as: Colace Take 1 capsule (100 mg total) by mouth daily.   insulin lispro 100 UNIT/ML injection Commonly known as: HUMALOG Inject 0.03 mLs (3 Units total) into the skin 3 (three) times daily with meals. Use before each meal three times daily. 140-199=2 units, 200-250=4 units, 251-299=6 units, 300-349=8 units, 350 or above 10 units What changed: how much to take   Insulin Syringe-Needle U-100 25G X 1" 1 ML Misc For 4 times a day insulin SQ, 1 month supply. Diagnosis E11.65   lactulose 10 GM/15ML solution Commonly known as: CHRONULAC Take 30 mLs (20 g total) by mouth daily. Skip if you have diarrhea.    Levemir FlexTouch 100 UNIT/ML FlexPen Generic drug: insulin detemir Inject 6 Units into the skin daily. Please take 30 units daytime , and 18 units at nighttime. What changed:  how much to take how to take this when to take this   lidocaine 5 % Commonly known as: LIDODERM Place 1 patch onto the skin daily. Remove & Discard patch within 12 hours or as directed by MD Start taking on: March 08, 2021   Melatonin 10 MG Caps Take 1 tablet by mouth at bedtime as needed (sleep).   polyethylene glycol 17 g packet Commonly known as: MIRALAX / GLYCOLAX Take 17 g by mouth 2 (two) times daily.   sertraline 50 MG tablet Commonly known as: ZOLOFT Take 1 tablet (50 mg total) by mouth daily. For anxiety and depression.   vitamin B-12 1000 MCG tablet Commonly known as: CYANOCOBALAMIN Take 1 tablet (1,000 mcg total) by mouth daily.       Allergies  Allergen Reactions   Latex Other (See Comments)    Pt  had allergic reaction to tegaderm dressing on IV.  Caused blisters on the skin.   Aspirin Rash   Codeine Sulfate Rash   Erythromycin Rash and Other (See Comments)    Fever, also   Prednisone Swelling   Rosiglitazone Maleate Swelling   Tetanus-Diphtheria Toxoids Td Rash and Other (See Comments)    Fever, also    Follow-up Information     [name], NP. Schedule an appointment as soon as possible for a visit in 2 week(s).   Specialty: Internal Medicine Why: f/u in 1-2 weeks, Contact information: [place] [address] [phone]         Minna Merritts, MD .   Specialty: Cardiology Contact information: [place] [address] [phone]                  The results of significant diagnostics from this hospitalization (including imaging, microbiology, ancillary and laboratory) are listed below for reference.    Significant Diagnostic Studies: DG Chest 2 View  Result Date: 02/22/2021 CLINICAL DATA:  Chest  pain.  Hypoglycemia.  Unresponsive. EXAM: CHEST - 2 VIEW COMPARISON:  02/12/2021 FINDINGS: Heart size and pulmonary vascularity are normal. Lungs are clear. Calcification of the aorta. Degenerative changes in the spine and shoulders. No pleural effusions. No pneumothorax. Mediastinal contours appear intact. IMPRESSION: No active cardiopulmonary disease. Electronically Signed   By: Lucienne Capers M.D.   On: 02/22/2021 20:32   DG Ribs Unilateral Right  Result Date: 03/04/2021 CLINICAL DATA:  Possible fall, right rib pain EXAM: RIGHT RIBS - 2 VIEW COMPARISON:  CT chest, 02/22/2021 FINDINGS: No acute displaced fractures of the right ribs are identified. There are redemonstrated, subacute, partially callused fractures of the right lateral eighth through tenth ribs. No pneumothorax or pleural effusion. IMPRESSION: No acute displaced fractures of the right ribs are identified. There are redemonstrated, subacute, partially callused fractures of the right lateral eighth through tenth ribs. Electronically Signed   By: Eddie Candle M.D.   On: 03/04/2021 12:34   CT Head Wo Contrast  Result Date: 03/04/2021 CLINICAL DATA:  65 y.o. female with past medical history of hypertension, diabetes, cirrhosis, CKD, atrial fibrillation, and chronic pain syndrome who presents to the ED for hyperglycemia. History is limited due to patient's altered mental status. Per EMS, patient was found by her husband to be less responsive than usual, when EMS arrived they found her blood sugar to be 27. She was subsequently given D10 and blood sugar improved to 116. EMS reports that patient became more awake and alert, but remains confused. Patient endorses pain in her chest as well as her back, states these have been going on for a long time. She denies any fevers, cough, shortness of breath, nausea, vomiting, abdominal pain, diarrhea, dysuria, or hematuria. She is not sure when her last meal was or when she last took insulin. EXAM: CT HEAD  WITHOUT CONTRAST TECHNIQUE: Contiguous axial images were obtained from the base of the skull through the vertex without intravenous contrast. COMPARISON:  02/22/2021 FINDINGS: Brain: No evidence of acute infarction, hemorrhage, hydrocephalus, extra-axial collection or mass lesion/mass effect. There is ventricular and sulcal enlargement reflecting age related volume loss. Mild patchy white matter hypoattenuation is also noted consistent with chronic microvascular ischemic change. These findings are stable. Vascular: No hyperdense vessel or unexpected calcification. Skull: Normal. Negative for fracture or focal lesion. Sinuses/Orbits: Globes and orbits are unremarkable. Trace amount of dependent fluid in the  right maxillary sinus. Minor right ethmoid, sphenoid and inferior right frontal sinus mucosal thickening. Other: None. IMPRESSION: 1. No acute intracranial abnormalities. 2. Age related volume loss and mild chronic microvascular ischemic change. 3. Mild sinus disease improved compared to the prior CT. Electronically Signed   By: Lajean Manes M.D.   On: 03/04/2021 10:33   CT Head Wo Contrast  Result Date: 02/22/2021 CLINICAL DATA:  Hypoglycemia altered mental status EXAM: CT HEAD WITHOUT CONTRAST TECHNIQUE: Contiguous axial images were obtained from the base of the skull through the vertex without intravenous contrast. COMPARISON:  MRI 02/12/2021, CT brain 02/11/2021 FINDINGS: Brain: No acute territorial infarction, hemorrhage, or intracranial mass. Atrophy and chronic small vessel ischemic changes of the white matter. Stable ventricle size. Vascular: No hyperdense vessels.  Carotid vascular calcification Skull: Normal. Negative for fracture or focal lesion. Sinuses/Orbits: Mucosal thickening in the sphenoid and ethmoid sinuses. Fluid in the sphenoid sinus. Left greater than right mastoid effusion Other: None IMPRESSION: 1. No CT evidence for acute intracranial abnormality. Atrophy and chronic small vessel  ischemic changes of the white matter 2. Sinusitis.  Bilateral mastoid effusions. Electronically Signed   By: Donavan Foil M.D.   On: 02/22/2021 20:39   CT Angio Chest PE W and/or Wo Contrast  Result Date: 02/22/2021 CLINICAL DATA:  Positive D-dimer, chest pain, syncope EXAM: CT ANGIOGRAPHY CHEST WITH CONTRAST TECHNIQUE: Multidetector CT imaging of the chest was performed using the standard protocol during bolus administration of intravenous contrast. Multiplanar CT image reconstructions and MIPs were obtained to evaluate the vascular anatomy. CONTRAST:  38m OMNIPAQUE IOHEXOL 350 MG/ML SOLN COMPARISON:  Radiograph 02/22/2021 FINDINGS: Cardiovascular: Satisfactory opacification the pulmonary arteries to the segmental level. No pulmonary artery filling defects are identified. Central pulmonary arteries are normal caliber. Cardiomegaly. Coronary artery atherosclerosis. Trace pericardial fluid, stable from prior. Slight dilatation of the ascending thoracic aorta to 4 cm. No acute luminal abnormality of the imaged aorta. No periaortic stranding or hemorrhage. Minimal plaque within the aorta and tortuous the normally branching proximal great vessels. No major venous abnormalities or significant venous reflux. Mediastinum/Nodes: No mediastinal fluid or gas. Normal thyroid gland and thoracic inlet. No acute abnormality of the trachea. Circumferential thickening of the distal thoracic esophagus without free air or fluid. No worrisome mediastinal, hilar or axillary adenopathy. Lungs/Pleura: Atelectatic changes likely accentuated by imaging during exhalation. No focal consolidative opacity. Mild peribronchial thickening. No pneumothorax. No effusion. No concerning pulmonary nodules or masses. Upper Abdomen: Cirrhotic changes of the liver including a nodular hepatic surface contour with caudate left lobe hypertrophy. Splenomegaly similar to prior. Upper abdominal venous collateralization. Mild symmetric bilateral  perinephric stranding, a nonspecific finding which may correlate with advanced age or decreased renal function. No acute abnormalities present in the visualized portions of the upper abdomen. Musculoskeletal: No acute osseous abnormality or suspicious osseous lesion. Review of the MIP images confirms the above findings. IMPRESSION: 1. No evidence of pulmonary embolism. 2. Atelectatic changes likely accentuated by imaging during exhalation. 3. Mild peribronchial thickening, could reflect airways inflammation early interstitial edema. 4. Cirrhotic morphology of the liver with nodular surface contour, hepatosplenomegaly and upper abdominal collateralization. 5. Circumferential thickening of the distal thoracic esophagus, could correlate for features of esophagitis. No CT evidence of perforation. 6. Cardiomegaly.  Coronary artery atherosclerosis. 7.  Aortic Atherosclerosis (ICD10-I70.0). 8. Ascending thoracic aortic dilatation to 4 cm. Recommend annual imaging followup by CTA or MRA. This recommendation follows 2010 ACCF/AHA/AATS/ACR/ASA/SCA/SCAI/SIR/STS/SVM Guidelines for the Diagnosis and Management of Patients with Thoracic Aortic Disease.  Circulation. 2010; 121: D428-J681. Aortic aneurysm NOS (ICD10-I71.9). Electronically Signed   By: Lovena Le M.D.   On: 02/22/2021 23:37   MR BRAIN WO CONTRAST  Result Date: 02/25/2021 CLINICAL DATA:  Delirium EXAM: MRI HEAD WITHOUT CONTRAST TECHNIQUE: Multiplanar, multiecho pulse sequences of the brain and surrounding structures were obtained without intravenous contrast. COMPARISON:  02/12/2021 FINDINGS: Brain: No acute infarct, mass effect or extra-axial collection. No acute or chronic hemorrhage. There is multifocal hyperintense T2-weighted signal within the white matter. Generalized volume loss without a clear lobar predilection. The midline structures are normal. Vascular: Major flow voids are preserved. Skull and upper cervical spine: Normal calvarium and skull base.  Visualized upper cervical spine and soft tissues are normal. Sinuses/Orbits:Mild paranasal sinus mucosal disease. Small mastoid effusions. Normal orbits. IMPRESSION: 1. No acute intracranial abnormality. 2. Generalized volume loss and findings of chronic small vessel ischemia. Electronically Signed   By: Ulyses Jarred M.D.   On: 02/25/2021 00:27   MR BRAIN WO CONTRAST  Result Date: 02/12/2021 CLINICAL DATA:  Altered mental status. Unresponsive with gaze deviation. EXAM: MRI HEAD WITHOUT CONTRAST TECHNIQUE: Multiplanar, multiecho pulse sequences of the brain and surrounding structures were obtained without intravenous contrast. COMPARISON:  Head CT yesterday. FINDINGS: Brain: Diffusion imaging does not show any acute or subacute infarction. Chronic small-vessel ischemic changes are present within the pons. Few old small vessel cerebellar infarctions on the right. Cerebral hemispheres show mild chronic small-vessel ischemic changes of the white matter. No large vessel territory infarction. No mass lesion, hemorrhage, hydrocephalus or extra-axial collection. Vascular: Major vessels at the base of the brain show flow. Skull and upper cervical spine: Negative Sinuses/Orbits: Paranasal sinuses show some opacification in the right ethmoid region. Orbits are negative. Other: Bilateral mastoid effusions, left more extensive than right IMPRESSION: No acute or subacute infarction. Chronic small-vessel ischemic changes of the pons and cerebral hemispheric white matter. Right ethmoid sinus opacification. Mastoid effusions, left more extensive than right. Electronically Signed   By: Nelson Chimes M.D.   On: 02/12/2021 17:41   DG Chest Portable 1 View  Result Date: 03/04/2021 CLINICAL DATA:  Hypoglycemia. Confused patient. Chest and back pain. EXAM: PORTABLE CHEST 1 VIEW COMPARISON:  02/24/2021 and older studies. FINDINGS: Cardiac silhouette is normal in size.  No mediastinal hilar masses. Clear lungs.  No convincing pleural  effusion or pneumothorax. Skeletal structures are grossly intact. IMPRESSION: No active disease. Electronically Signed   By: Lajean Manes M.D.   On: 03/04/2021 10:30   DG Chest Port 1 View  Result Date: 02/24/2021 CLINICAL DATA:  Wheezing EXAM: PORTABLE CHEST 1 VIEW COMPARISON:  CT 02/22/2021, radiograph 02/22/2021, 02/12/2021 FINDINGS: Mild central airways thickening. No consolidation or effusion. Mild cardiomegaly. No pneumothorax. Low lung volumes. IMPRESSION: 1. Mild cardiomegaly without edema or sizeable effusion 2. Low lung volumes.  Suspect mild central airways thickening Electronically Signed   By: Donavan Foil M.D.   On: 02/24/2021 17:22   DG Chest Port 1 View  Result Date: 02/12/2021 CLINICAL DATA:  Shortness of breath EXAM: PORTABLE CHEST 1 VIEW COMPARISON:  Portable exam 1442 hours compared to 02/11/2021 FINDINGS: Enlargement of cardiac silhouette. Mediastinal contours and pulmonary vascularity normal. Peribronchial thickening with minimal LEFT basilar atelectasis. Remaining lungs clear. No acute infiltrate, pleural effusion or pneumothorax. Surgical clips RIGHT upper quadrant question cholecystectomy. No acute osseous findings. IMPRESSION: Bronchitic changes with minimal LEFT basilar atelectasis. Enlargement of cardiac silhouette. Electronically Signed   By: Lavonia Dana M.D.   On: 02/12/2021 14:59  DG Chest Port 1 View  Result Date: 02/11/2021 CLINICAL DATA:  Hypoxia. EXAM: PORTABLE CHEST 1 VIEW COMPARISON:  Chest x-ray 01/25/2021, chest x-ray 12/12/2020, CT chest 07/24/2019 FINDINGS: The heart size and mediastinal contours are unchanged. Aortic calcification. Patchy left lower lobe opacities. Similar appearing coarsened interstitial markings with no overt pulmonary edema. Trace left pleural effusion not excluded. No pneumothorax. No acute osseous abnormality. IMPRESSION: Patchy left lower lobe opacities and possible trace left pleural effusion. Finding may represent atelectasis versus  infection/inflammation. Consider PA and lateral view of the chest for further evaluation. Electronically Signed   By: Iven Finn M.D.   On: 02/11/2021 02:55   EEG adult  Result Date: 02/12/2021 Lora Havens, MD     02/12/2021  1:52 PM Patient Name: JANIAYA RYSER MRN: [id number] Epilepsy Attending: Lora Havens Referring Physician/Provider: Dr Harrold Donath Date: 02/12/2021 Duration: 23.50 mins Patient history: 65 year old female presenting to the ED after being found in an awake unresponsive state at home. EEG to evaluate for seizure. Level of alertness: Awake AEDs during EEG study: None Technical aspects: This EEG study was done with scalp electrodes positioned according to the 10-20 International system of electrode placement. Electrical activity was acquired at a sampling rate of 500Hz  and reviewed with a high frequency filter of 70Hz  and a low frequency filter of 1Hz . EEG data were recorded continuously and digitally stored. Description: The posterior dominant rhythm consists of 7 Hz activity of moderate voltage (25-35 uV) seen predominantly in posterior head regions, symmetric and reactive to eye opening and eye closing. EEG showed continuous generalized 5 to 6 Hz theta as well as intermittent 2-3hz  delta slowing. Generalized periodic discharges with triphasic morphology at  1 Hz were also noted. Hyperventilation and photic stimulation were not performed.   ABNORMALITY - Periodic discharges with triphasic morphology, generalized ( GPDs) - Continuous slow, generalized IMPRESSION: This study is suggestive of moderate diffuse encephalopathy, nonspecific etiology but could be secondary to toxic-metabolic causes. No seizures or definite epileptiform discharges were seen throughout the recording. Lora Havens   CT HEAD CODE STROKE WO CONTRAST  Result Date: 02/11/2021 CLINICAL DATA:  Code stroke.  Acute neurologic deficit EXAM: CT HEAD WITHOUT CONTRAST TECHNIQUE: Contiguous axial images were  obtained from the base of the skull through the vertex without intravenous contrast. COMPARISON:  12/12/2020 FINDINGS: Brain: There is no mass, hemorrhage or extra-axial collection. The size and configuration of the ventricles and extra-axial CSF spaces are normal. There is hypoattenuation of the periventricular white matter, most commonly indicating chronic ischemic microangiopathy. Vascular: No abnormal hyperdensity of the major intracranial arteries or dural venous sinuses. No intracranial atherosclerosis. Skull: The visualized skull base, calvarium and extracranial soft tissues are normal. Sinuses/Orbits: No fluid levels or advanced mucosal thickening of the visualized paranasal sinuses. No mastoid or middle ear effusion. The orbits are normal. ASPECTS [facility] Stroke Program Early CT Score) - Ganglionic level infarction (caudate, lentiform nuclei, internal capsule, insula, M1-M3 cortex): 7 - Supraganglionic infarction (M4-M6 cortex): 3 Total score (0-10 with 10 being normal): 10 IMPRESSION: 1. Chronic ischemic microangiopathy without acute intracranial abnormality. 2. ASPECTS is 10. 3. These results were communicated to Dr. Kerney Elbe at 2:22 am on 02/11/2021 by text page via the [facility] messaging system. Electronically Signed   By: Ulyses Jarred M.D.   On: 02/11/2021 02:23    Microbiology: Recent Results (from the past 240 hour(s))  Expectorated Sputum Assessment w Gram Stain, Rflx to Resp Cult  Status: None   Collection Time: 02/27/21  9:41 AM   Specimen: Expectorated Sputum  Result Value Ref Range Status   Specimen Description EXPECTORATED SPUTUM  Final   Special Requests NONE  Final   Sputum evaluation   Final    Sputum specimen not acceptable for testing.  Please recollect.   Lavon Paganini, RN [phone] 02/27/21 [facility] Performed at [facility], [address] [place], [place] [address]    Report Status 02/27/2021 FINAL  Final  MRSA PCR Screening     Status: None   Collection Time:  02/27/21  9:41 AM  Result Value Ref Range Status   MRSA by PCR NEGATIVE NEGATIVE Final    Comment:        The GeneXpert MRSA Assay (FDA approved for NASAL specimens only), is one component of a comprehensive MRSA colonization surveillance program. It is not intended to diagnose MRSA infection nor to guide or monitor treatment for MRSA infections. Performed at [facility], [address] [place], [place] [address]   Resp Panel by RT-PCR (Flu A&B, Covid) Nasopharyngeal Swab     Status: None   Collection Time: 03/04/21  5:59 AM   Specimen: Nasopharyngeal Swab; Nasopharyngeal(NP) swabs in vial transport medium  Result Value Ref Range Status   SARS Coronavirus 2 by RT PCR NEGATIVE NEGATIVE Final    Comment: (NOTE) SARS-CoV-2 target nucleic acids are NOT DETECTED.  The SARS-CoV-2 RNA is generally detectable in upper respiratory specimens during the acute phase of infection. The lowest concentration of SARS-CoV-2 viral copies this assay can detect is 138 copies/mL. A negative result does not preclude SARS-Cov-2 infection and should not be used as the sole basis for treatment or other patient management decisions. A negative result may occur with  improper specimen collection/handling, submission of specimen other than nasopharyngeal swab, presence of viral mutation(s) within the areas targeted by this assay, and inadequate number of viral copies(<138 copies/mL). A negative result must be combined with clinical observations, patient history, and epidemiological information. The expected result is Negative.  Fact Sheet for Patients:  EntrepreneurPulse.com.au  Fact Sheet for Healthcare Providers:  IncredibleEmployment.be  This test is no t yet approved or cleared by the [place] FDA and  has been authorized for detection and/or diagnosis of SARS-CoV-2 by FDA under an Emergency Use Authorization (EUA). This EUA will remain  in  effect (meaning this test can be used) for the duration of the COVID-19 declaration under Section 564(b)(1) of the Act, 21 U.S.C.section 360bbb-3(b)(1), unless the authorization is terminated  or revoked sooner.       Influenza A by PCR NEGATIVE NEGATIVE Final   Influenza B by PCR NEGATIVE NEGATIVE Final    Comment: (NOTE) The Xpert Xpress SARS-CoV-2/FLU/RSV plus assay is intended as an aid in the diagnosis of influenza from Nasopharyngeal swab specimens and should not be used as a sole basis for treatment. Nasal washings and aspirates are unacceptable for Xpert Xpress SARS-CoV-2/FLU/RSV testing.  Fact Sheet for Patients: EntrepreneurPulse.com.au  Fact Sheet for Healthcare Providers: IncredibleEmployment.be  This test is not yet approved or cleared by the [place] FDA and has been authorized for detection and/or diagnosis of SARS-CoV-2 by FDA under an Emergency Use Authorization (EUA). This EUA will remain in effect (meaning this test can be used) for the duration of the COVID-19 declaration under Section 564(b)(1) of the Act, 21 U.S.C. section 360bbb-3(b)(1), unless the authorization is terminated or revoked.  Performed at [facility], [address] [place],  [place] [address]   Urine culture     Status: None   Collection Time: 03/04/21  5:59 AM   Specimen: Urine, Random  Result Value Ref Range Status   Specimen Description   Final    URINE, RANDOM Performed at [facility], [address], [place], [place] [address]    Special Requests   Final    NONE Performed at [facility], [address], [place], [place] [address]    Culture   Final    NO GROWTH Performed at [facility], [place] [address], [place], [place] [address]    Report Status 03/05/2021 FINAL  Final  Blood culture (routine single)     Status: None (Preliminary result)   Collection Time: 03/04/21 10:14 AM   Specimen: BLOOD   Result Value Ref Range Status   Specimen Description BLOOD LEFT ANTECUBITAL  Final   Special Requests   Final    BOTTLES DRAWN AEROBIC AND ANAEROBIC Blood Culture results may not be optimal due to an excessive volume of blood received in culture bottles   Culture   Final    NO GROWTH 3 DAYS Performed at [facility], [address] [place],  [address]    Report Status PENDING  Incomplete     Labs: Basic Metabolic Panel: Recent Labs  Lab 03/04/21 0559 03/04/21 0931 03/05/21 0455 03/06/21 0501 03/07/21 0430  NA 140  --  136 136 138  K 3.2*  --  5.2* 4.5 5.0  CL 107  --  106 108 109  CO2 27  --  24 23 25   GLUCOSE 70  --  232* 197* 216*  BUN 14  --  18 19 20   CREATININE 1.02*  --  1.29* 1.40* 1.49*  CALCIUM 8.4*  --  8.3* 7.9* 8.5*  MG  --  2.0  --   --   --    Liver Function Tests: Recent Labs  Lab 03/04/21 0559  AST 66*  ALT 62*  ALKPHOS 142*  BILITOT 1.0  PROT 6.4*  ALBUMIN 2.9*   No results for input(s): LIPASE, AMYLASE in the last 168 hours. Recent Labs  Lab 03/04/21 0931  AMMONIA 14   CBC: Recent Labs  Lab 03/04/21 0559 03/06/21 0501 03/07/21 0430  WBC 4.7 5.8 6.0  NEUTROABS 3.5 4.3 4.4  HGB 10.9* 9.1* 9.4*  HCT 34.2* 28.1* 28.8*  MCV 88.4 87.8 87.0  PLT 94* 110* 124*   Cardiac Enzymes: No results for input(s): CKTOTAL, CKMB, CKMBINDEX, TROPONINI in the last 168 hours. BNP: BNP (last 3 results) Recent Labs    02/14/21 0243 02/15/21 0423 02/16/21 0209  BNP 68.6 56.6 39.5    ProBNP (last 3 results) No results for input(s): PROBNP in the last 8760 hours.  CBG: Recent Labs  Lab 03/06/21 1135 03/06/21 1559 03/06/21 2152 03/07/21 0816 03/07/21 1139  GLUCAP 141* 214* 177* 187* 228*       Signed:  Irine Seal MD.  [facility] 03/07/2021, 12:55 PM

## 2021-03-07 NOTE — Telephone Encounter (Signed)
Patient is back in the hospital and needs a plan for care following her return home.    Patient and husband are non compliant with her care and it has become unsafe for [name], NP to continue to manage patient.  A better plan for her medical care needs to be made.   I have reached out to Quinn Plowman, RN our care manager to discuss the seriousness of this situation.  We would like to connect patient with care she can be compliant with as she is in desperate need for strong medical and primary care.   I will discuss plan for care management referral to take patient on at discharge.  We also will see patient (if she will keep appt) for hospital follow up at discharge and plan for dismissal.  Patient will stay under care management's umbrella and they will help connect her with suitable primary care support and aide in the transition as I am concerned for patient's cognitive ability, especially with lack of in home support, to follow through with making new primary care connections on her own.   Will discuss with PCP for referral approval.

## 2021-03-07 NOTE — TOC Transition Note (Signed)
Transition of Care [facility]) - CM/SW Discharge Note   Patient Details  Name: [name] MRN: [id number] Date of Birth: [birth year]  Transition of [facility]) CM/SW Contact:  Shelbie Hutching, RN Phone Number: 03/07/2021, 1:27 PM   Clinical Narrative:    Patient is medically cleared for discharge back home with home health services.  Pharmacy has delivered the patient's medication augmentin to the bedside so she will have it at home.  RNCM is arranging [facility] which will arrive at 1437. Sarah with [facility] notified of discharge today, home health orders are in.     Final next level of care: [place] Barriers to Discharge: Barriers Resolved   Patient Goals and CMS Choice Patient states their goals for this hospitalization and ongoing recovery are:: To get better and go home CMS Medicare.gov Compare Post Acute Care list provided to:: Patient Choice offered to / list presented to : Patient  Discharge Placement                       Discharge Plan and Services   Discharge Planning Services: CM Consult Post Acute Care Choice: Home Health          DME Arranged: N/A DME Agency: NA       HH Arranged: RN, PT, OT, Nurse's Aide, Social Work [employer] Agency: [facility] Date [place]: 03/07/21 Time [place]: 68 Representative spoke with at [place]: [place] ([place]) Interventions     Readmission Risk Interventions Readmission Risk Prevention Plan 03/04/2021 10/01/2019 07/18/2019  Transportation Screening Complete Complete Complete  Medication Review Press photographer) Complete Complete Complete  PCP or Specialist appointment within 3-5 days of discharge Complete Complete Complete  HRI or Home Care Consult Complete Complete Complete  SW Recovery Care/Counseling Consult Complete Complete Complete  Palliative Care Screening Not Applicable Not Applicable Not [place] Not  Applicable - Not Applicable  Some recent data might be hidden

## 2021-03-08 NOTE — Telephone Encounter (Signed)
Will place Nursing Care Management orders to get patient connected to supports and monitoring in home.  Care Manager will also help [name] connect patient with a better plan for her primary care management as she is not able to get here for appointments.    Patient just discharged from the hospital yesterday and should be followed by home health.  She will need hospital follow up with [name], NP if she will be compliant.   There are safety concerns for patient in the home due to her noncompliance and inability to meet her care needs.  In addition, we are not able to get patient to come to the office for care and she is a complex, high acuity need patient.  It has become unsafe and a risk for our provider to continue trying to manage her care without adequate visits. We will be dismissing her but not without helping her connect with better primary care plan as she does not have the capacity to navigate a change like this on her own.   We have warned her multiple times regarding her no shows but the pattern has continued creating complications and safety concerns.  In addition, she is increasing in hospital re-admissions and ER visits and needs additional care supports in the home environment.  Care Manager, [name] and I discussed the option of a PACE program to be able to meet her needs if patient is agreeable.  Will place order today for [name] to get involved with patient and we can work towards transitioning her to a better care connection that she has a better chance of being compliant with.   I will also reach out to patient to schedule her hospital follow up and let her know the details of our plan to connect her with care management while working towards a dismissal and transfer to another primary care Freight forwarder.

## 2021-03-09 LAB — CULTURE, BLOOD (SINGLE): Culture: NO GROWTH

## 2021-03-09 NOTE — Addendum Note (Signed)
Addended by: Magdalen Spatz C on: 03/09/2021 02:32 PM   Modules accepted: Orders

## 2021-03-10 ENCOUNTER — Telehealth: Payer: Self-pay | Admitting: *Deleted

## 2021-03-10 NOTE — Chronic Care Management (AMB) (Signed)
  Chronic Care Management   Outreach Note  03/10/2021 Name: [name] MRN: [id number] DOB: 06/13/1956  [name] is a 65 y.o. year old female who is a primary care patient of [name], NP. I reached out to [name] by phone today in response to a referral sent by [name]'s PCP, [name], NP      An unsuccessful telephone outreach was attempted today. The patient was referred to the case management team for assistance with care management and care coordination.   Follow Up Plan: A HIPAA compliant phone message was left for the patient providing contact information and requesting a return call. The care management team will reach out to the patient again over the next 7 days.  If patient returns call to provider office, please advise to call [place] at [phone].  [place] Management

## 2021-03-17 NOTE — Telephone Encounter (Signed)
Attempted to reach patient multiple times to discuss need for care management set up and follow up with [name], NP to discuss her dismissal and transfer.   I will send patient a letter to have her contact office as soon as possible. We do not have a viable phone number as the one listed says that it is invalid.

## 2021-03-17 NOTE — Chronic Care Management (AMB) (Signed)
  Chronic Care Management   Outreach Note  03/17/2021 Name: [name] MRN: [id number] DOB: 1955/10/09  [name] is a 66 y.o. year old female who is a primary care patient of [name], NP. I reached out to [name] by phone today in response to a referral sent by [name]'s PCP, [name], NP      A second unsuccessful telephone outreach was attempted today. The patient was referred to the case management team for assistance with care management and care coordination.   Follow Up Plan: The care management team will reach out to the patient again over the next 7 days.  If patient returns call to provider office, please advise to call [place] at [phone].  [place] Management

## 2021-03-24 NOTE — Chronic Care Management (AMB) (Signed)
  Chronic Care Management   Outreach Note  03/24/2021 Name: [name] MRN: [id number] DOB: Jun 29, 1956  [name] is a 65 y.o. year old female who is a primary care patient of [name], NP. I reached out to [name] by phone today in response to a referral sent by [name]'s PCP, [name], NP      Third unsuccessful telephone outreach was attempted today. The patient was referred to the case management team for assistance with care management and care coordination. The patient's primary care provider has been notified of our unsuccessful attempts to make or maintain contact with the patient. The care management team is pleased to engage with this patient at any time in the future should he/she be interested in assistance from the care management team.   Follow Up Plan: We have been unable to make contact with the patient. The care management team is available to follow up with the patient after provider conversation with the patient regarding recommendation for care management engagement and subsequent re-referral to the care management team.  If patient returns call to provider office, please advise to call [name]* at [phone].*  [place] Management

## 2021-03-31 DIAGNOSIS — I4891 Unspecified atrial fibrillation: Secondary | ICD-10-CM | POA: Diagnosis not present

## 2021-03-31 DIAGNOSIS — E162 Hypoglycemia, unspecified: Secondary | ICD-10-CM | POA: Diagnosis not present

## 2021-04-05 NOTE — Telephone Encounter (Addendum)
I attempted to reach patient again at home number. Home phone is not working.  Her emergency contact info is for her husband who has the same number.   Patient never reached back out to our office after receiving the letter mailed on 03/17/21.   Today I called [facility] APS to make a report of concern for patient based on grounds of:  1. Unable to connect with patient for any level of follow up care from the hospital.  Care management, home health and PCP office have not been able to communicate with patient.   2.  Concern in the home for patient safety as was documented in provider/ER notes that patient was not eating and presented with altered level of consciousness due to hypoglycemia from taking insulin but not having adequate nourishment.    3.  Patient is a high acuity patient with many chronic medical conditions and must be followed closely by PCP.  We have not been able to have face to face with patient in over 1 year despite multiple attempts including setting up transportation supports.  Patient has been to ER and/or hospitalized 20+ times since Feb 2021.  During the month of June this year she was in and out of hospital multiple times.  Our office, home health and care management services have been unable to contact patient to initiate care follow through. Patient was supposed to see PCP and have follow up on abnormal labs shortly after discharge.   4.  There are concerns for financial support and being able to meet needs.  They do not have a car and rely on others for transportation.  Husband makes his appts; however, patient is never able.  We do not know why that is.    We would like for APS to consider involving a social worker to check on patient and evaluate for home safety and ensure that there is no neglect occurring.  Also, if supports are needed, to help patient and husband connect.  Is patient not eating because there is lack of food or just lack of appetite?  Are they unable  to pay bills? Are they safe?  What is going on with her health and how can we get her connected with a PACE program who can come and get her for appointments and provide her medical care since we are unable to safely do so from our office?   To my knowledge, there have been no signs of physical, mental or verbal abuse towards patient.  We are just not sure what is occurring in the home and want to ensure patient is connected with adequate care, monitoring and support to meet her needs. We have concerns for patients safety and if her general ADL and iADL needs are being met and that she connect with adequate medical care.

## 2021-04-06 NOTE — Telephone Encounter (Signed)
Noted and appreciate all the work going into helping this patient.  I agree that the most important thing to do for her right now is to ensure she is safe.  Agree that the PACE program would be well suited for patient given her inability to present to our office for care despite the transportation we have provided.  Will await social worker report.

## 2021-04-06 NOTE — Telephone Encounter (Signed)
Spoke with [facility], Adult Protective Services:  [name].  I explained all of prior documented concerns.  She took all notes and made me aware that this would be sent for screening to determine if a social worker would be assigned to the case and that I should hear back within the next 24 hours

## 2021-04-06 NOTE — Telephone Encounter (Signed)
[facility] Tobin Chad (APS representative) called me back today to report that after screening, they have approved for a social worker to become involve and take next steps to investigate.   [place] informed me that this part of the process could take a few weeks but that I would hear from the Social Worker once they have begun the process.

## 2021-04-08 IMAGING — US US RENAL
1 series · 14 of 25 positions shown · non-contrast
Comparison: Noncontrast CT on 05/16/2019

CLINICAL DATA: Hydronephrosis.  Chronic urinary tract infection.

EXAM:
RENAL / URINARY TRACT ULTRASOUND COMPLETE

[Series 1: us renal · 0.27mm/px · 14 of 35 slices shown]
[im 1/35]
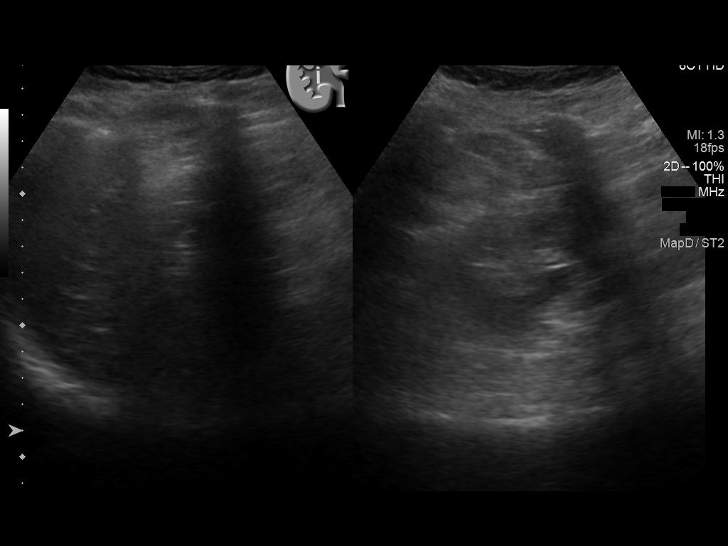
[im 3/35]
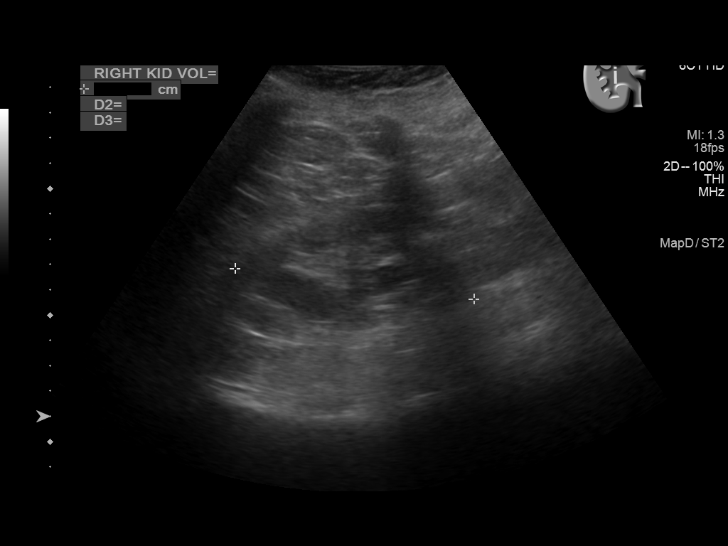
[im 6/35]
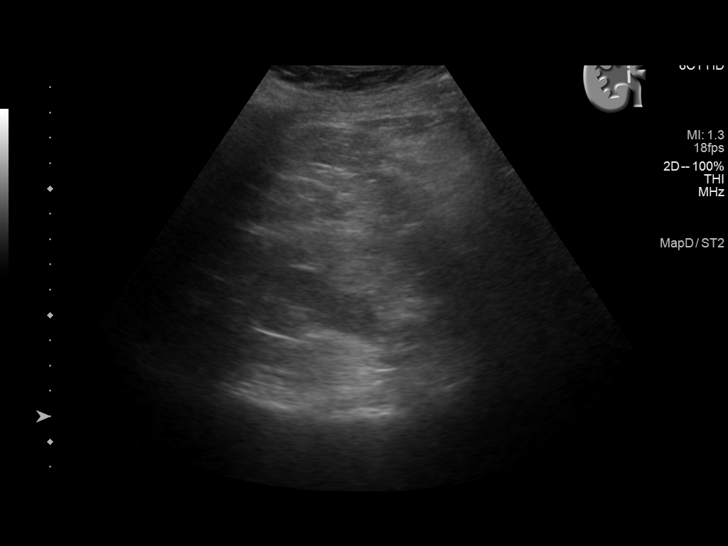
[im 9/35]
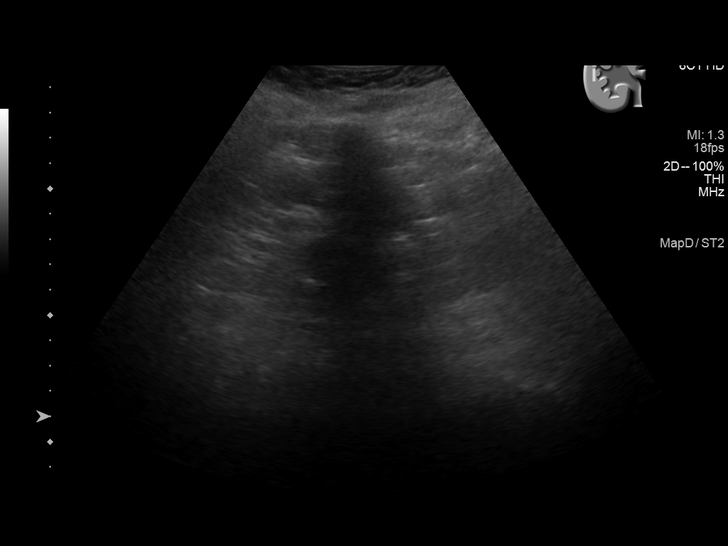
[im 12/35]
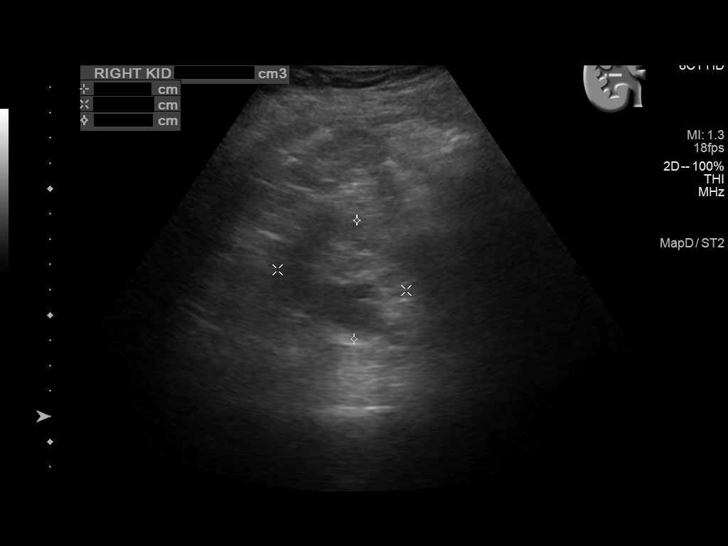
[im 13/35]
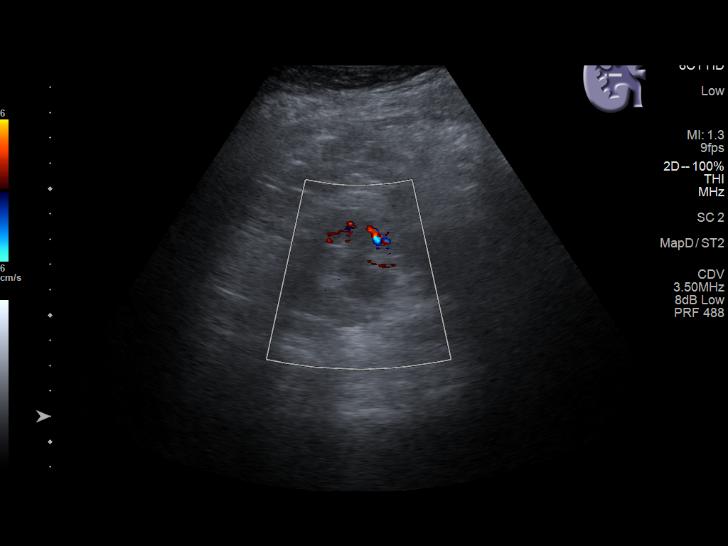
[im 16/35]
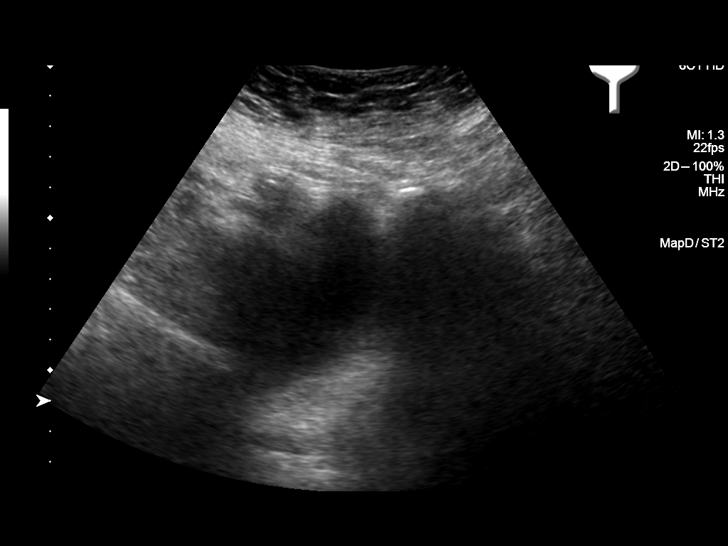
[im 19/35]
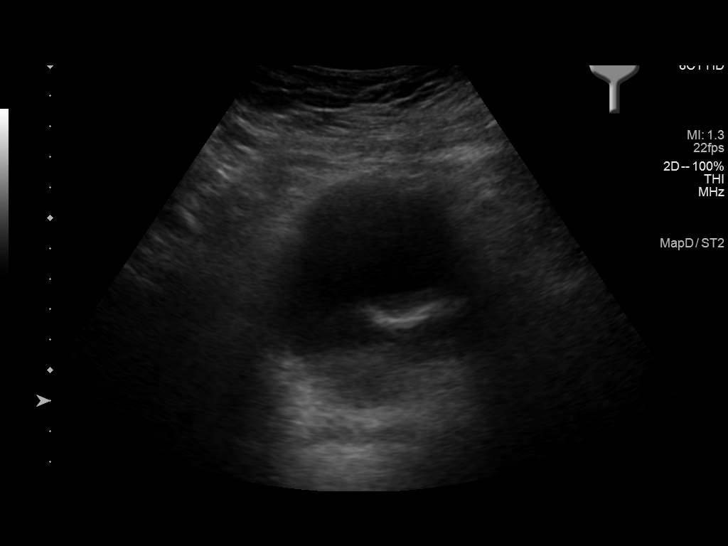
[im 22/35]
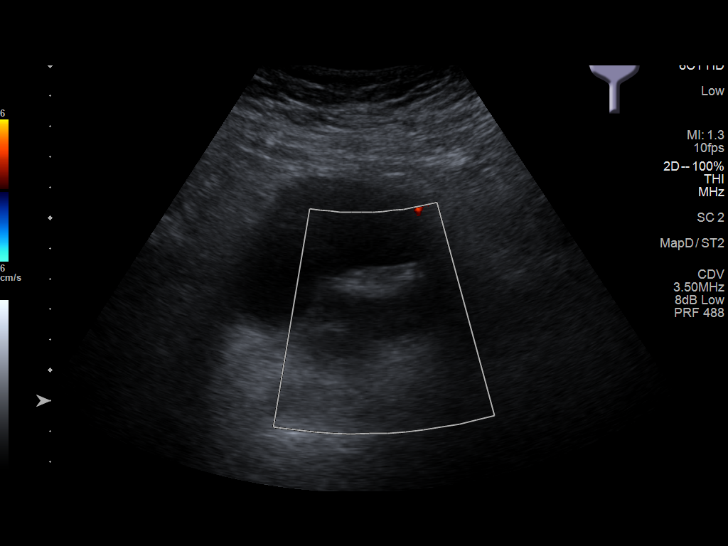
[im 23/35]
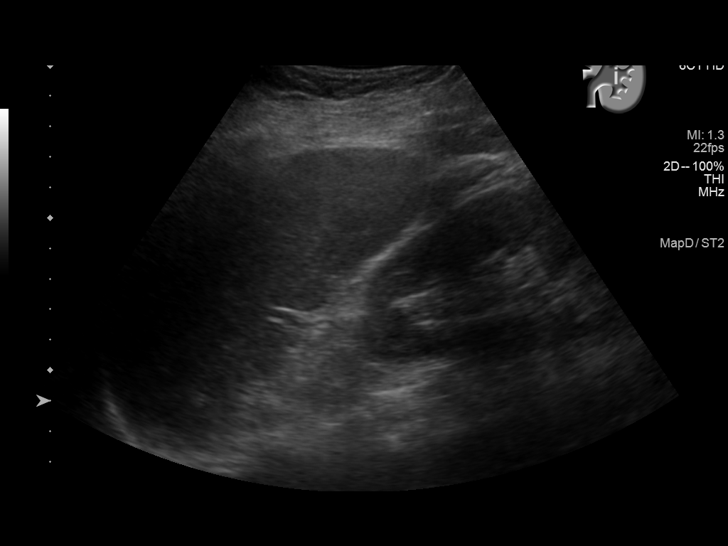
[im 26/35]
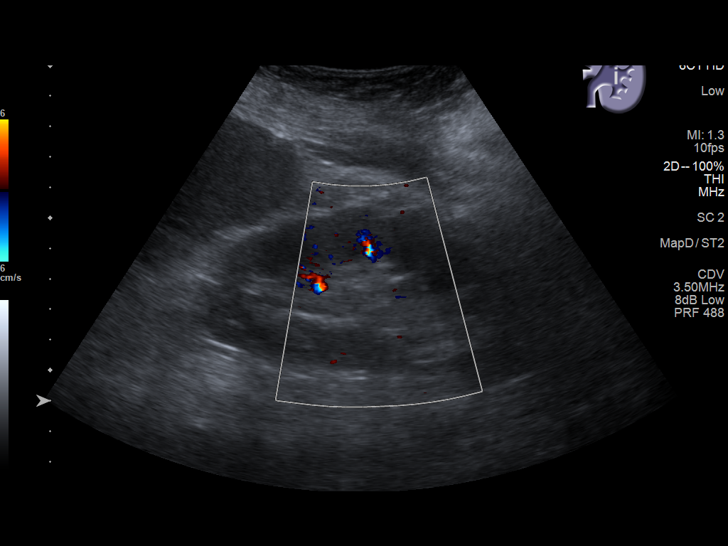
[im 29/35]
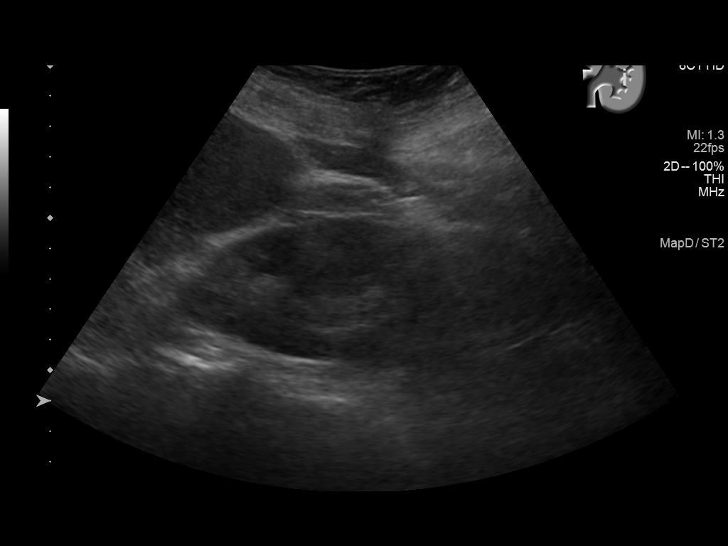
[im 32/35]
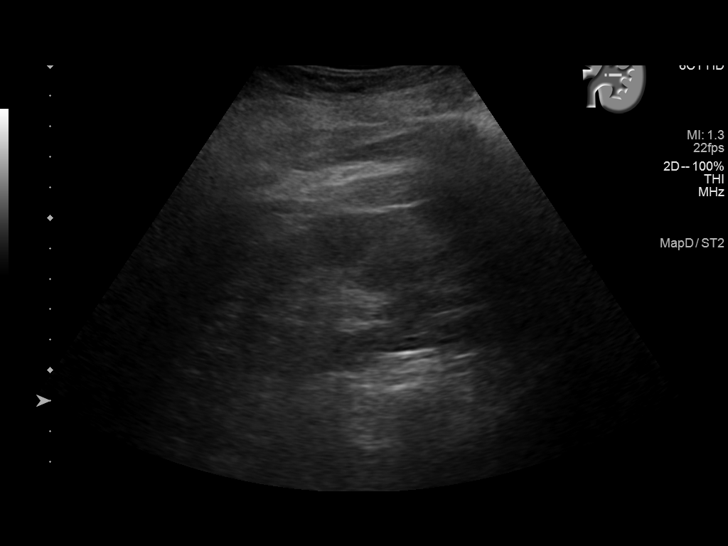
[im 35/35]
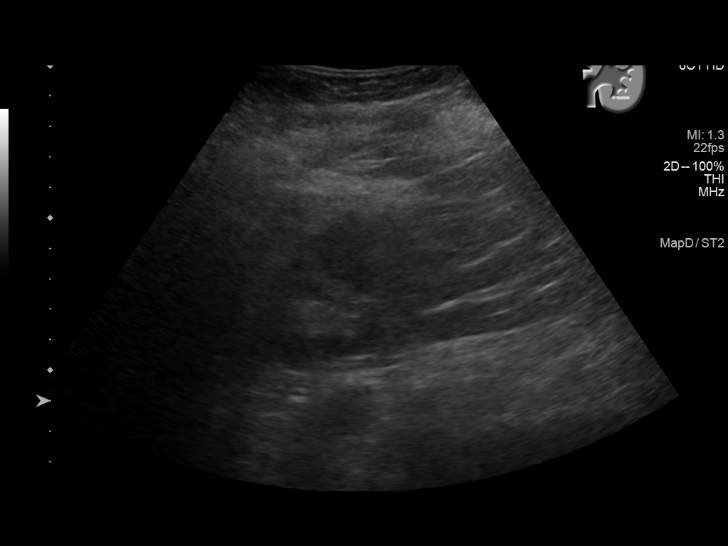

[14 of 25 positions shown; findings below may reference images not displayed]

FINDINGS: Right Kidney:

Renal measurements: 9.5 x 5.1 x 4.7 cm = volume: 120 mL. Increased
renal parenchymal echogenicity. No mass identified. Mild
pelvicaliectasis and right ureteral stent are better demonstrated on
recent CT.

Left Kidney:

Renal measurements: 11.1 x 6.5 x 5.3 cm = volume: 199 mL.
Echogenicity within normal limits. No mass or hydronephrosis
visualized.

Bladder:

Appears normal for degree of bladder distention. Distal portion of
ureteral stent is seen within the bladder.
IMPRESSION: Mild right renal pelvicaliectasis, with right ureteral stent in
place.

Diffuse right renal parenchymal atrophy and increased echogenicity.

Normal appearance of left kidney.

## 2021-04-08 IMAGING — CT CT ABD-PELV W/O CM
2 of 4 series · 16 of 46 positions shown, 18 images · non-contrast
Comparison: 09/25/2018

CLINICAL DATA: Abdominal pain

EXAM:
CT ABDOMEN AND PELVIS WITHOUT CONTRAST
TECHNIQUE: Multidetector CT imaging of the abdomen and pelvis was performed
following the standard protocol without IV contrast.

[Series 2: routine abd/pel wo · axial · 0.79mm/px · z∈[-807,-357]mm · 13 of 100 slices shown, 15 images]
[im 5/100  soft-tissue]
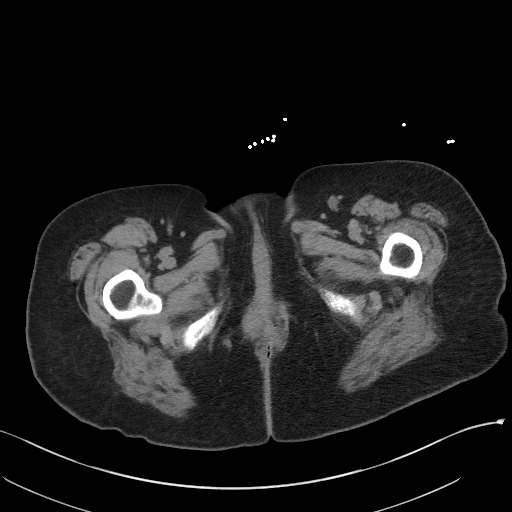
[im 5/100  bone]
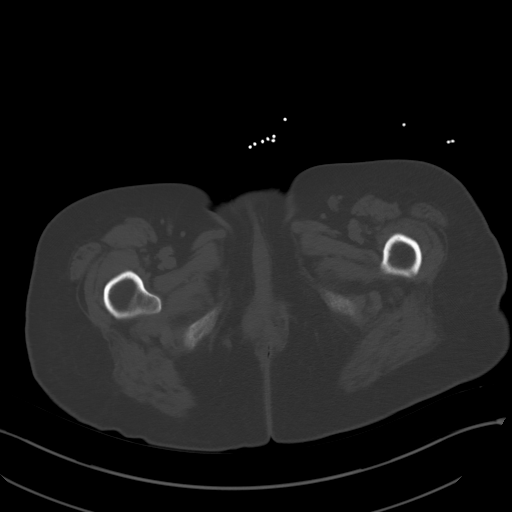
[im 13/100  soft-tissue]
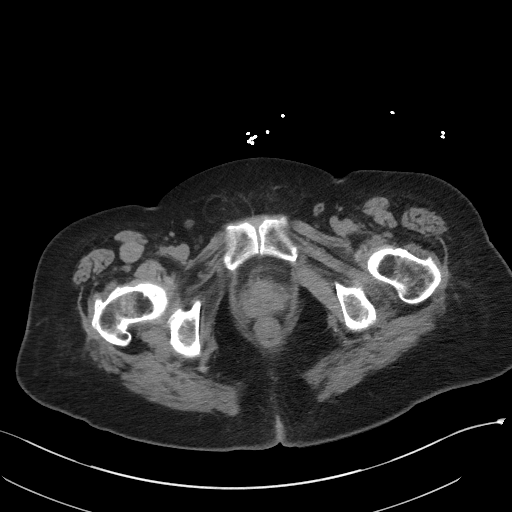
[im 22/100  soft-tissue]
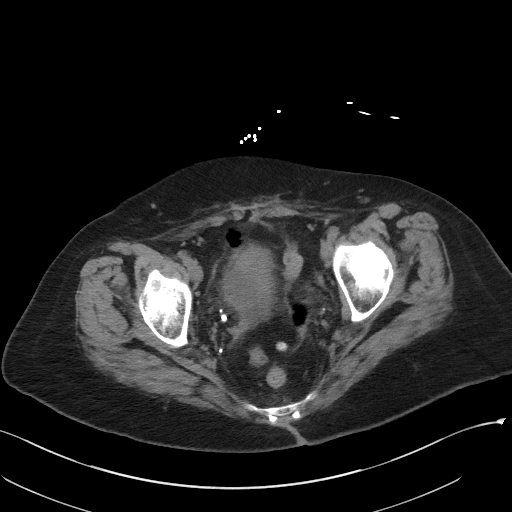
[im 26/100  soft-tissue]
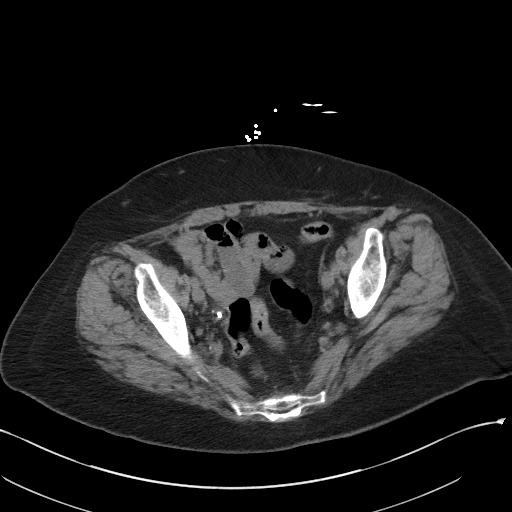
[im 35/100  soft-tissue]
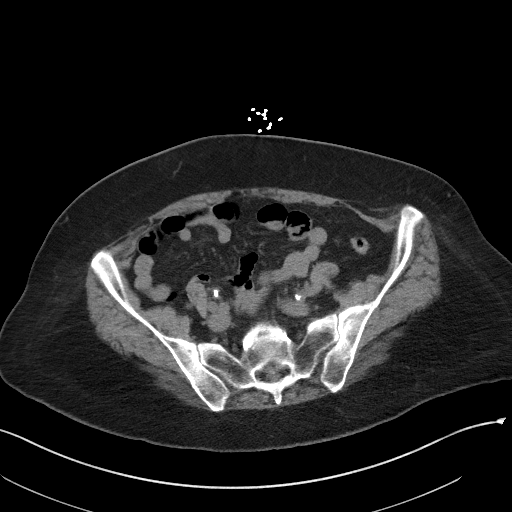
[im 44/100  soft-tissue]
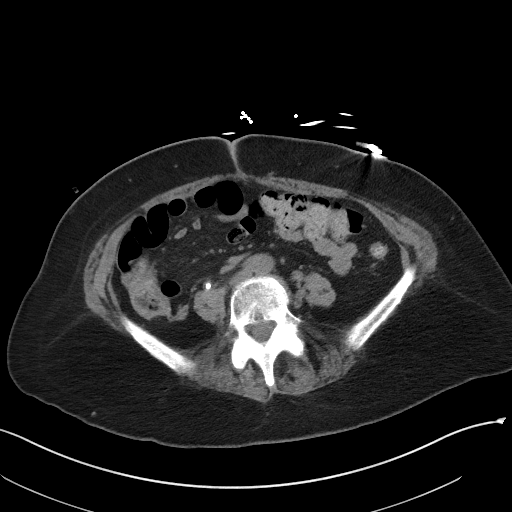
[im 52/100  soft-tissue]
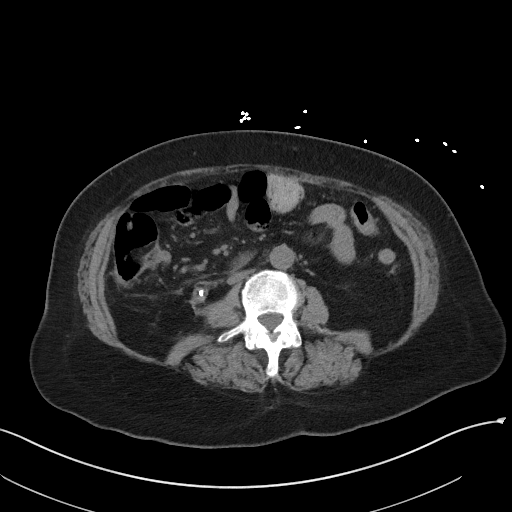
[im 56/100  soft-tissue]
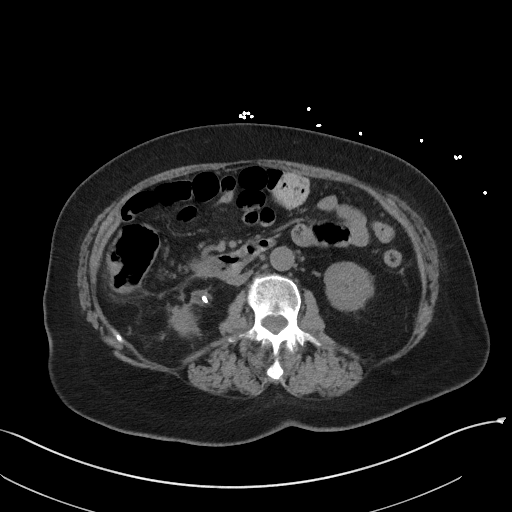
[im 65/100  soft-tissue]
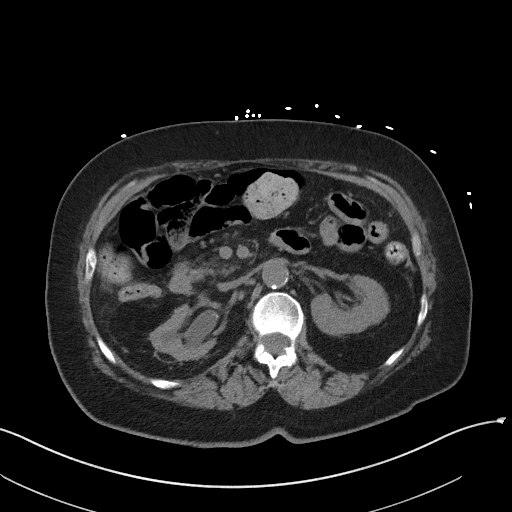
[im 65/100  bone]
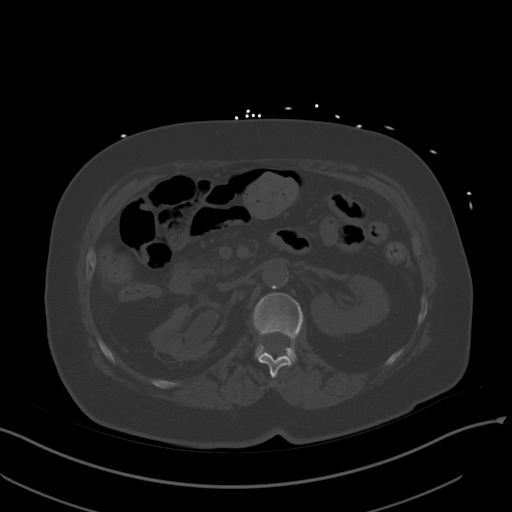
[im 74/100  soft-tissue]
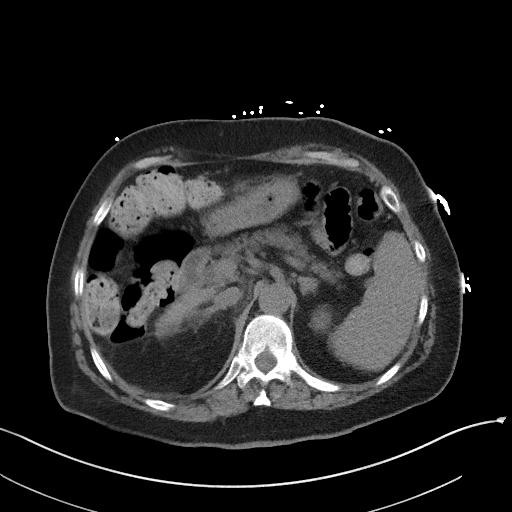
[im 78/100  soft-tissue]
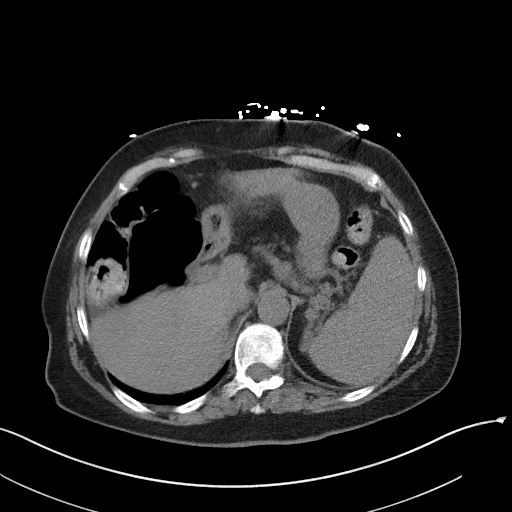
[im 87/100  soft-tissue]
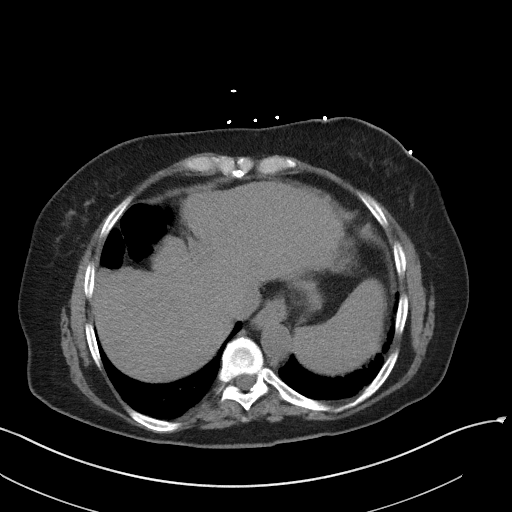
[im 95/100  soft-tissue]
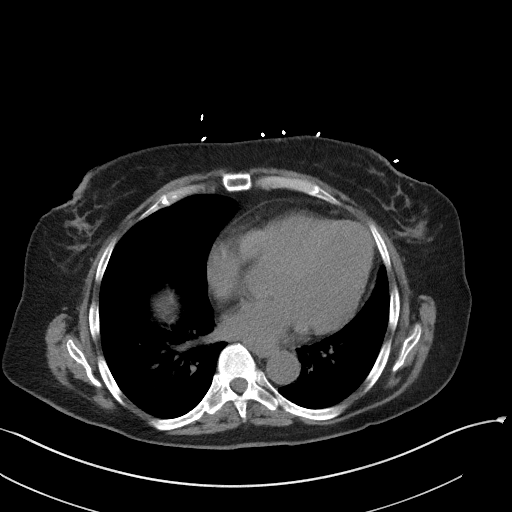

[Series 5: coronal st · coronal · 0.75mm/px · 3 of 76 slices shown]
[im 26/76  soft-tissue]
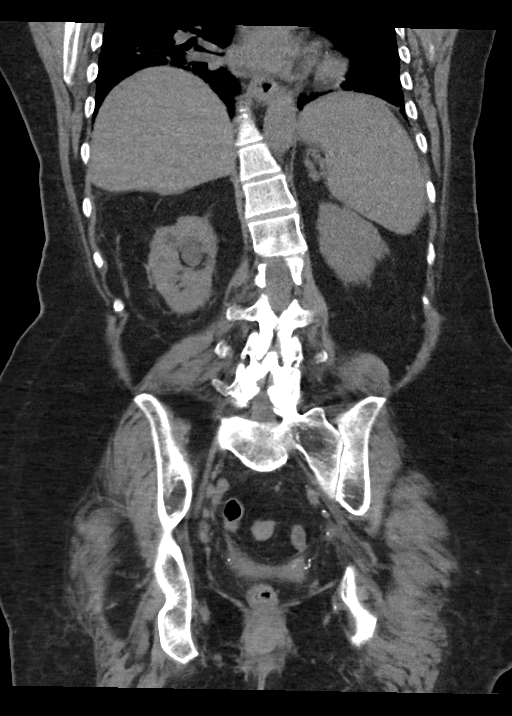
[im 34/76  soft-tissue]
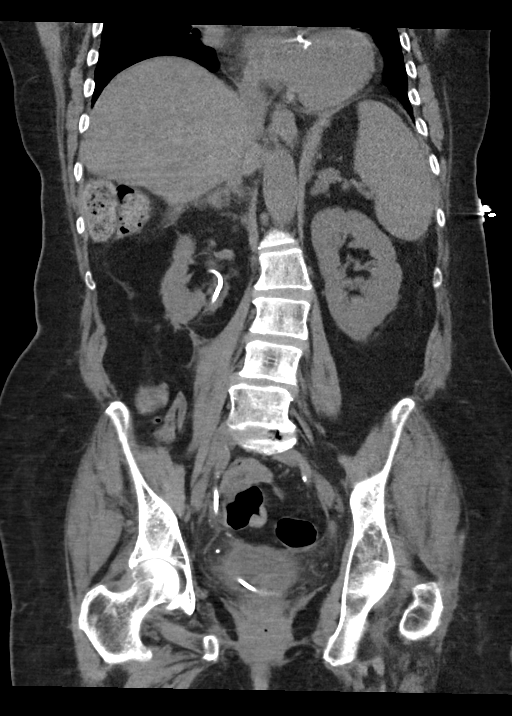
[im 42/76  soft-tissue]
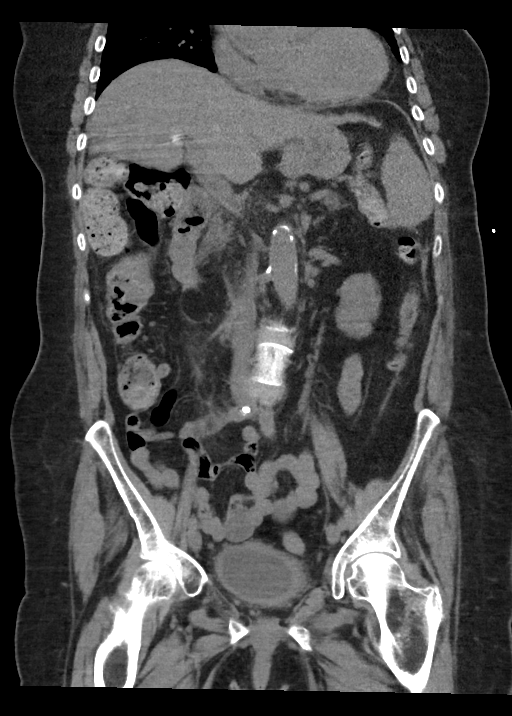

[16 of 46 positions shown; findings below may reference images not displayed]

FINDINGS: Lower chest: No acute abnormality.

Hepatobiliary: Nodular liver compatible with cirrhosis. Prior
cholecystectomy.

Pancreas: No focal abnormality or ductal dilatation.

Spleen: Mildly enlarged, stable since prior study.

Adrenals/Urinary Tract: Chronic mild right hydronephrosis with right
ureteral stent in place, stable. No hydronephrosis on the left.
Adrenal glands and urinary bladder unremarkable.

Stomach/Bowel: Moderate stool throughout the colon, particularly
right colon and transverse colon. Normal appendix. No evidence of
bowel obstruction. Scattered sigmoid diverticula. Stomach is
decompressed, grossly unremarkable.

Vascular/Lymphatic: Aortic atherosclerosis. No enlarged abdominal or
pelvic lymph nodes.

Reproductive: Prior hysterectomy.  No adnexal masses.

Other: Insert others

Musculoskeletal: No acute bony abnormality.
IMPRESSION: Changes of cirrhosis.  Associated splenomegaly.

No ascites currently.

Chronic mild right hydronephrosis with right ureteral stent in
place. Findings are unchanged.

Aortic atherosclerosis.

## 2021-04-09 IMAGING — DX DG KNEE COMPLETE 4+V*L*
4 series · 4 of 4 positions shown · non-contrast
Comparison: 04/10/2019

CLINICAL DATA: Pt reports left knee pain x 3 wks. NKI. Hx of
arthritis.

EXAM:
LEFT KNEE - COMPLETE 4+ VIEW

[knee ap]
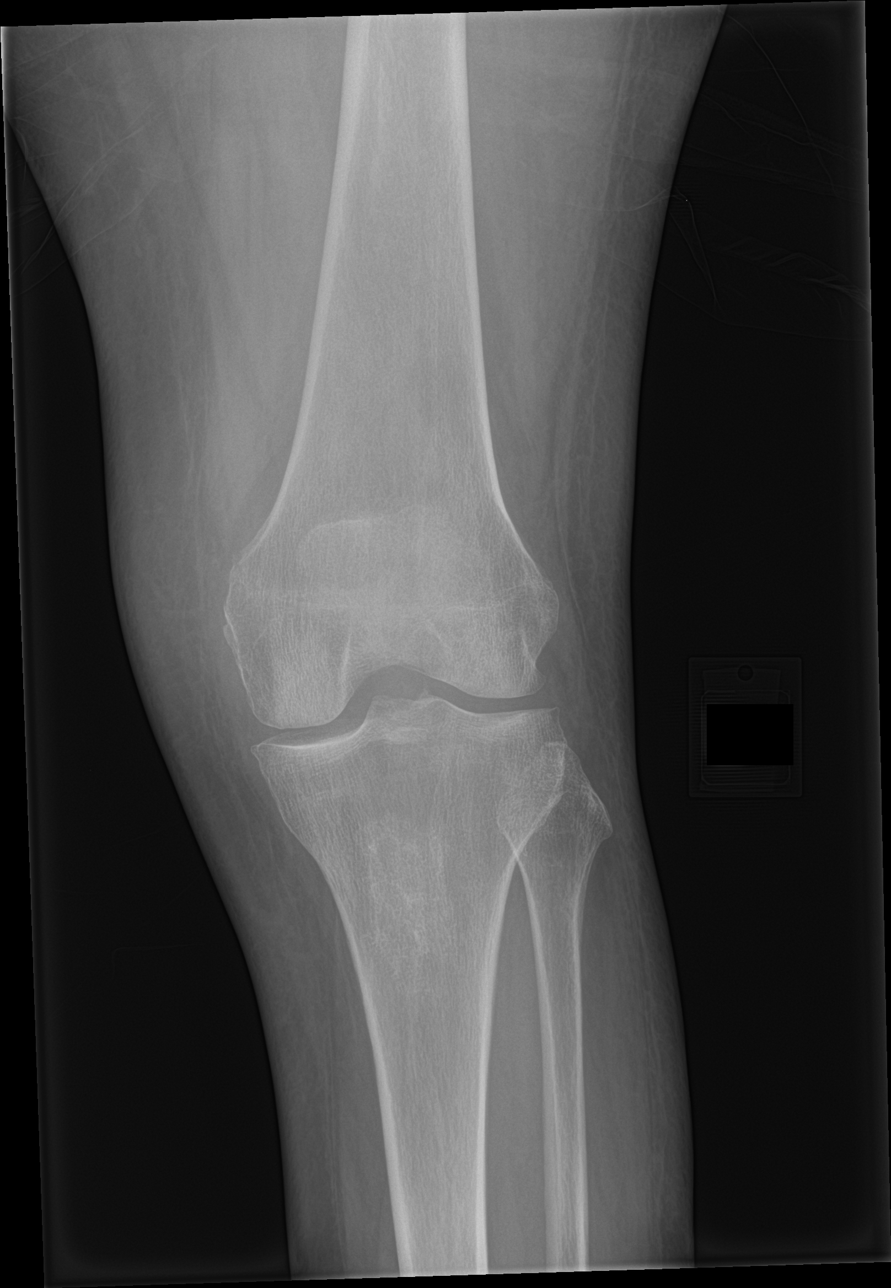

[knee lat]
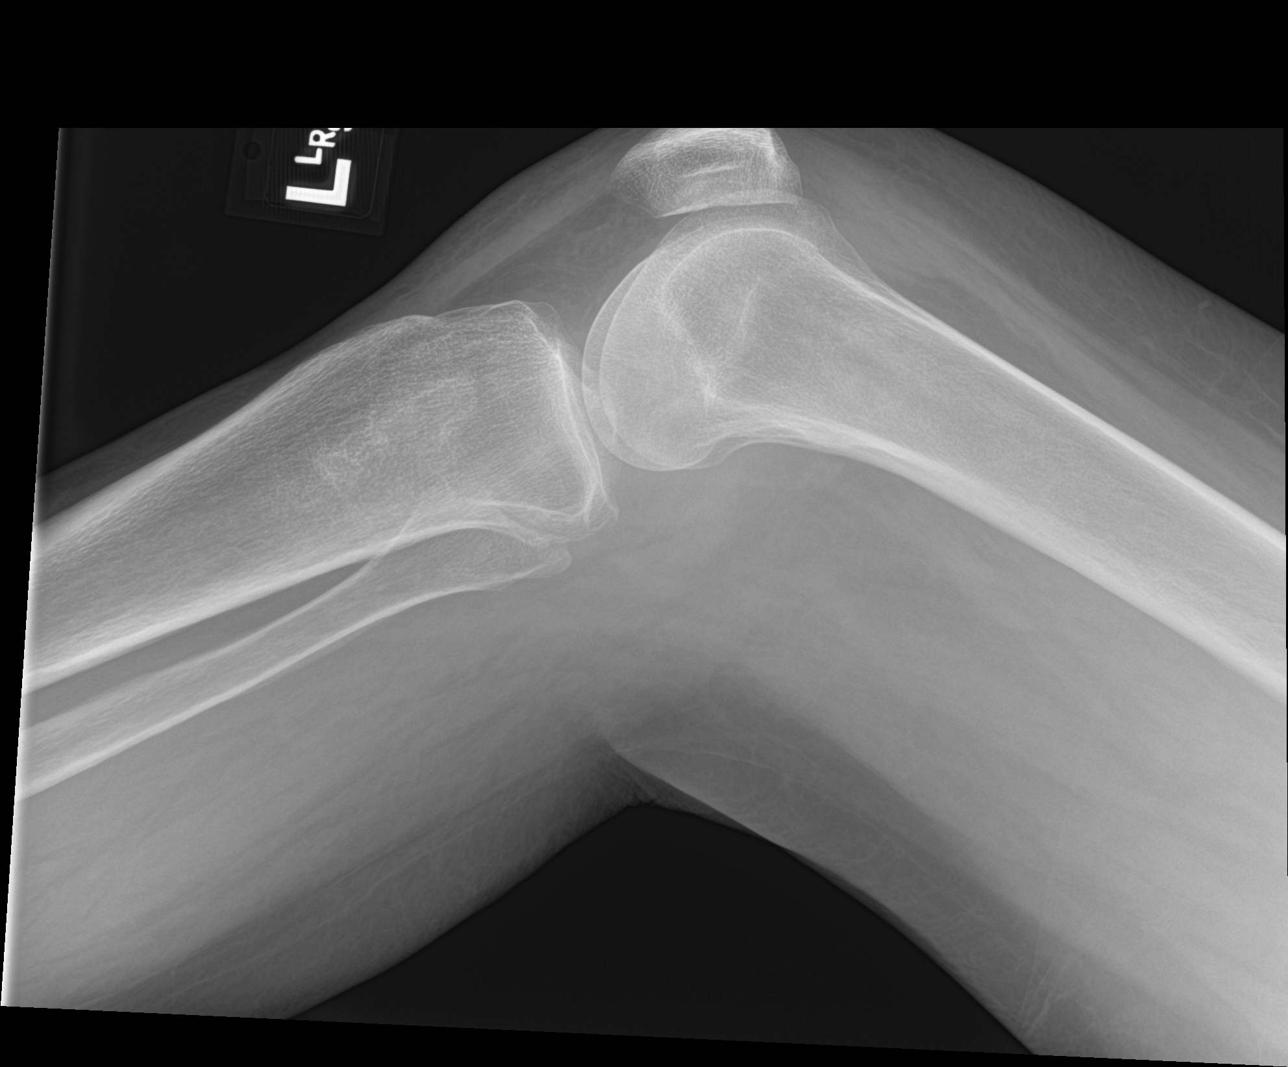

[knee obl (1 of 2)]
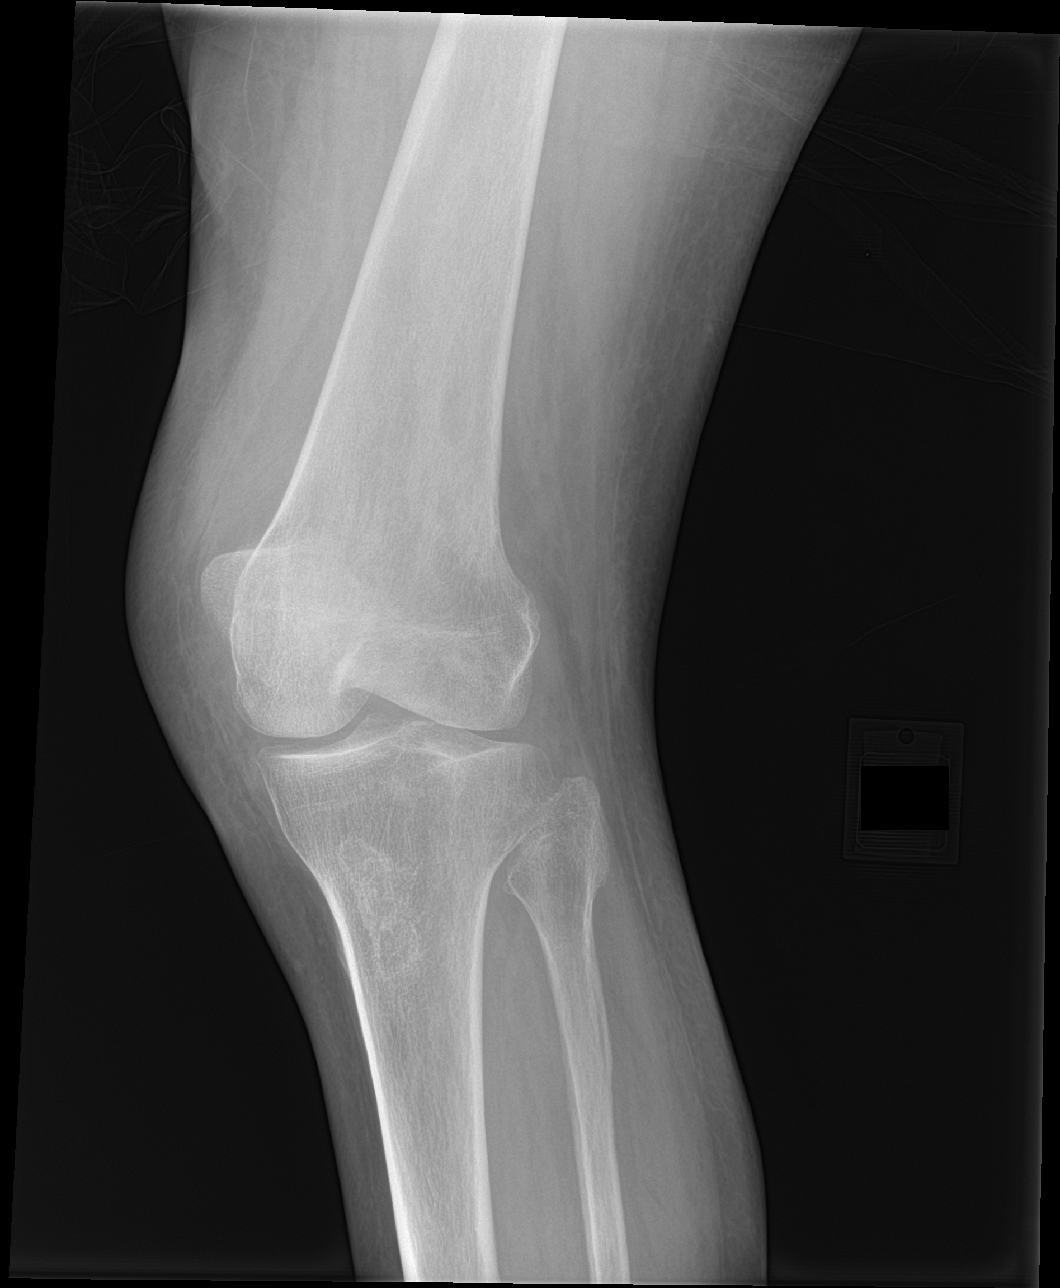

[knee obl (2 of 2)]
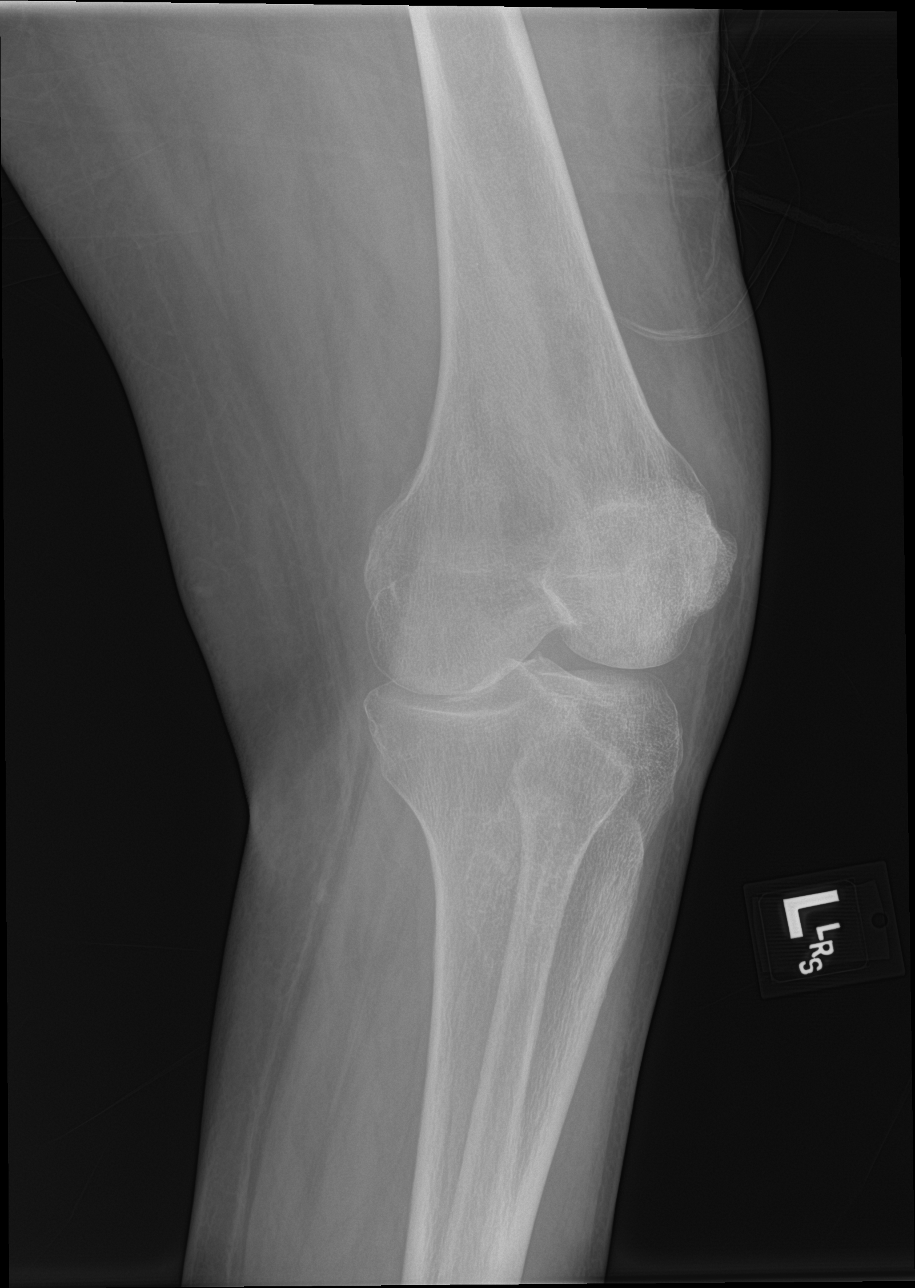

[4 of 4 positions shown; findings below may reference images not displayed]

FINDINGS: There is no acute fracture or subluxation. Trace joint effusion
noted. Note is made of infarct in the proximal tibia, a benign
finding.
IMPRESSION: 1. Trace joint effusion.
2. No evidence for acute abnormality.

## 2021-04-11 NOTE — Telephone Encounter (Signed)
As Leafy Ro is out of the office, I have been checking her voicemail. APS left a message stating they were unable to locate patient and it seems as if she no longer lives at the residence. I reached out to pt's friend, Elsie Ra (on DPR) to inquire if she knew of a way to get a hold of Nakeisha as we have been unsuccessful in our attempts. I did not give anymore information other than that. Tamie stated that after Bethanee got out of the hospital after falling and getting hurt, they ended up with an eviction notice and had to move. The phone number on file is the old home number, so that is why it no longer works. Tamie stated she will try to find a way to get in contact with [name] as she believes she moved towards [place] and have her reach out to the office.

## 2021-04-11 NOTE — Telephone Encounter (Signed)
Noted.   I cannot safely continue to refill her medications unless she is seen. If she's moved closer to [facility] then I recommend she set up with a local PCP. Will also await any updates from patient's friend.

## 2021-05-01 DIAGNOSIS — I4891 Unspecified atrial fibrillation: Secondary | ICD-10-CM | POA: Diagnosis not present

## 2021-05-01 DIAGNOSIS — E162 Hypoglycemia, unspecified: Secondary | ICD-10-CM | POA: Diagnosis not present

## 2021-05-23 ENCOUNTER — Telehealth: Payer: Self-pay | Admitting: *Deleted

## 2021-05-23 NOTE — Telephone Encounter (Signed)
FYI Tried to call patient several times and got the message "The  number you have dialed is not in service." Unable to reach the patient by telephone.

## 2021-05-23 NOTE — Telephone Encounter (Signed)
PLEASE NOTE: All timestamps contained within this report are represented as [place] Standard Time. CONFIDENTIALTY NOTICE: This fax transmission is intended only for the addressee. It contains information that is legally privileged, confidential or otherwise protected from use or disclosure. If you are not the intended recipient, you are strictly prohibited from reviewing, disclosing, copying using or disseminating any of this information or taking any action in reliance on or regarding this information. If you have received this fax in error, please notify us immediately by telephone so that we can arrange for its return to [name]. Phone: [phone], Toll-Free: [phone], Fax: [phone] Page: 1 of 1 Call Id: [id number] [facility] - Client TELEPHONE ADVICE RECORD [facility] Patient Name: [name] Gender: Female DOB: 12/18/1955 Age: 25 Y 86 M 23 D Return Phone Number: [phone] (Primary) Address: City/ State/ Zip: [place] [address] Client [facility] - Client Client Site [place] - Day Physician Alma Friendly - NP Contact Type Call Who Is Calling Patient / Member / Family / Caregiver Call Type Triage / Clinical Relationship To Patient Self Return Phone Number [phone] (Primary) Chief Complaint Foot or Ankle Injury Reason for Call Symptomatic / Request for [place] states is calling from [facility] office in reference [name]. Caller states korianna hit something hard on ankle and toes and could not feel her toes now. Caller is icing it and want to know what else to do? caller also has appt. next wednesday at 1120am. Translation No Disp. Time Eilene Ghazi Time) Disposition Final User 05/23/2021 3:33:36 PM Attempt made - line busy Eugenio Hoes, RN, Jenny Reichmann 05/23/2021 3:37:18 PM Clinical Call Yes Eugenio Hoes, RN, Jenny Reichmann Comments User: Sharia Reeve Date/Time  Eilene Ghazi Time): 05/23/2021 3:30:56 PM Lead checked phone number, it is correct. User: Baird Cancer, RN Date/Time Eilene Ghazi Time): 05/23/2021 3:37:00 PM attempted to call patient number, line busy; cannot be completed as dialed. Reached out to [facility] to verify number, number verified as the one listed. Called office to verify number; long wait time. Called backline to try to verify patient number, no answer.

## 2021-05-24 NOTE — Telephone Encounter (Signed)
We are frequently unable to reach her.  [name], See phone note from 02/17/21. It looks like someone scheduled an appointment for her on 05/31/21, I highly doubt she will come. Where are we with dismissal and providing her with primary care services that are better suited for her?  What happened with P[facility] program? It looks like chronic care management was unable to reach her in July 2022.

## 2021-05-30 NOTE — Telephone Encounter (Signed)
There are no valid contact numbers to communicate with patient.  Also, had no valid address to communicate dismissal as APS communicated that current address has eviction notice on door.  I left message today for patient's friend Tammie to try and get [name] connected to confirm her appt and [place] location for tomorrow.  Have not been able to make any contact.   Chronic Care Manager, Quinn Plowman, and SW team also could not reach patient to connect her with care.   If patient shows, I or provider can explain to patient that she is being dismissed due to inability to connect with her, keep appts or safely provide care as a result of severe communication breakdowns. We can obtain contact info to have case manager connect with her to assist her with transitioning to more appropriate PCP care supports such as the PACE program moving forward.    If patient does not show tomorrow for appointment, I will see what we need to do to go ahead and have [name], NP officially removed as PCP since we are unable to communicate with her.

## 2021-05-31 ENCOUNTER — Ambulatory Visit: Payer: Medicaid Other | Admitting: Primary Care

## 2021-05-31 NOTE — Telephone Encounter (Signed)
Patient did not show up for her appointment today.

## 2021-06-01 DIAGNOSIS — E162 Hypoglycemia, unspecified: Secondary | ICD-10-CM | POA: Diagnosis not present

## 2021-06-01 DIAGNOSIS — I4891 Unspecified atrial fibrillation: Secondary | ICD-10-CM | POA: Diagnosis not present

## 2021-06-06 IMAGING — CR DG KNEE 1-2V*L*
1 series · 2 of 2 positions shown · non-contrast
Comparison: 05/17/2019

CLINICAL DATA: Knee pain after fall

EXAM:
LEFT KNEE - 1-2 VIEW

[Series 1: dg knee 1-2 views left · 0.14mm/px · 2 of 2 slices shown]
[im 1/2]
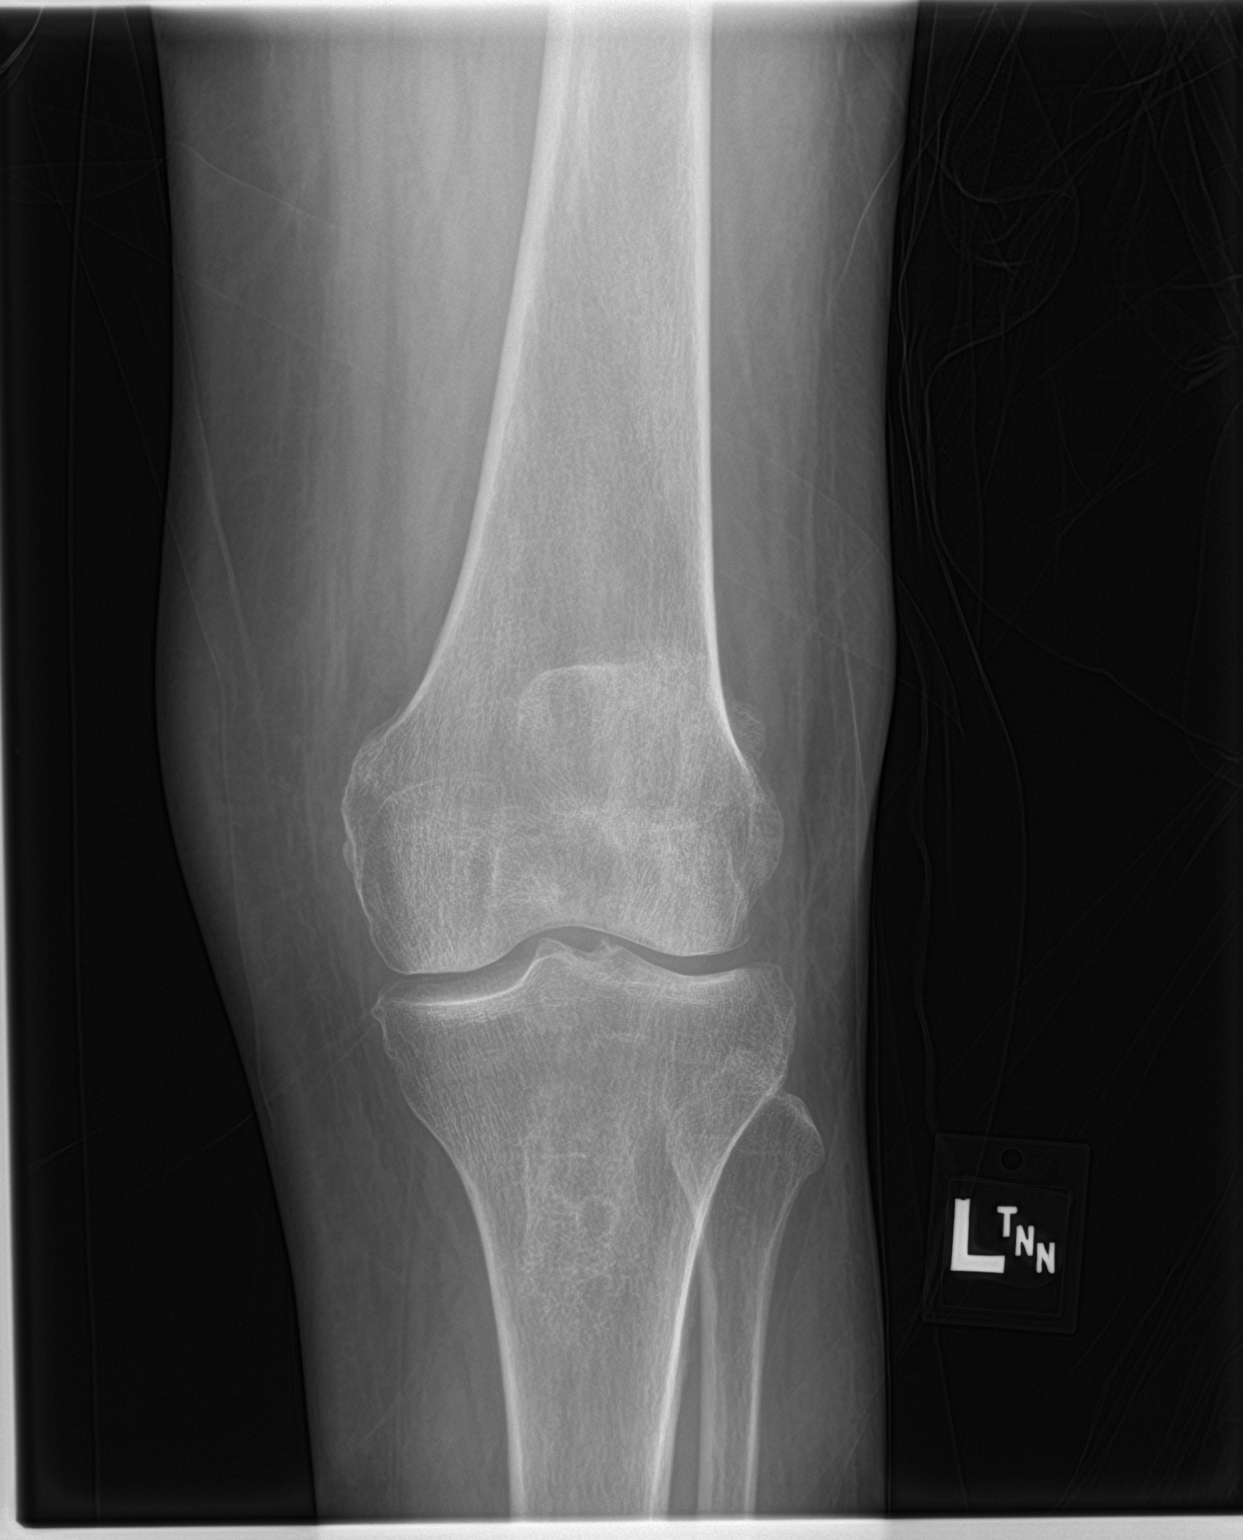
[im 2/2]
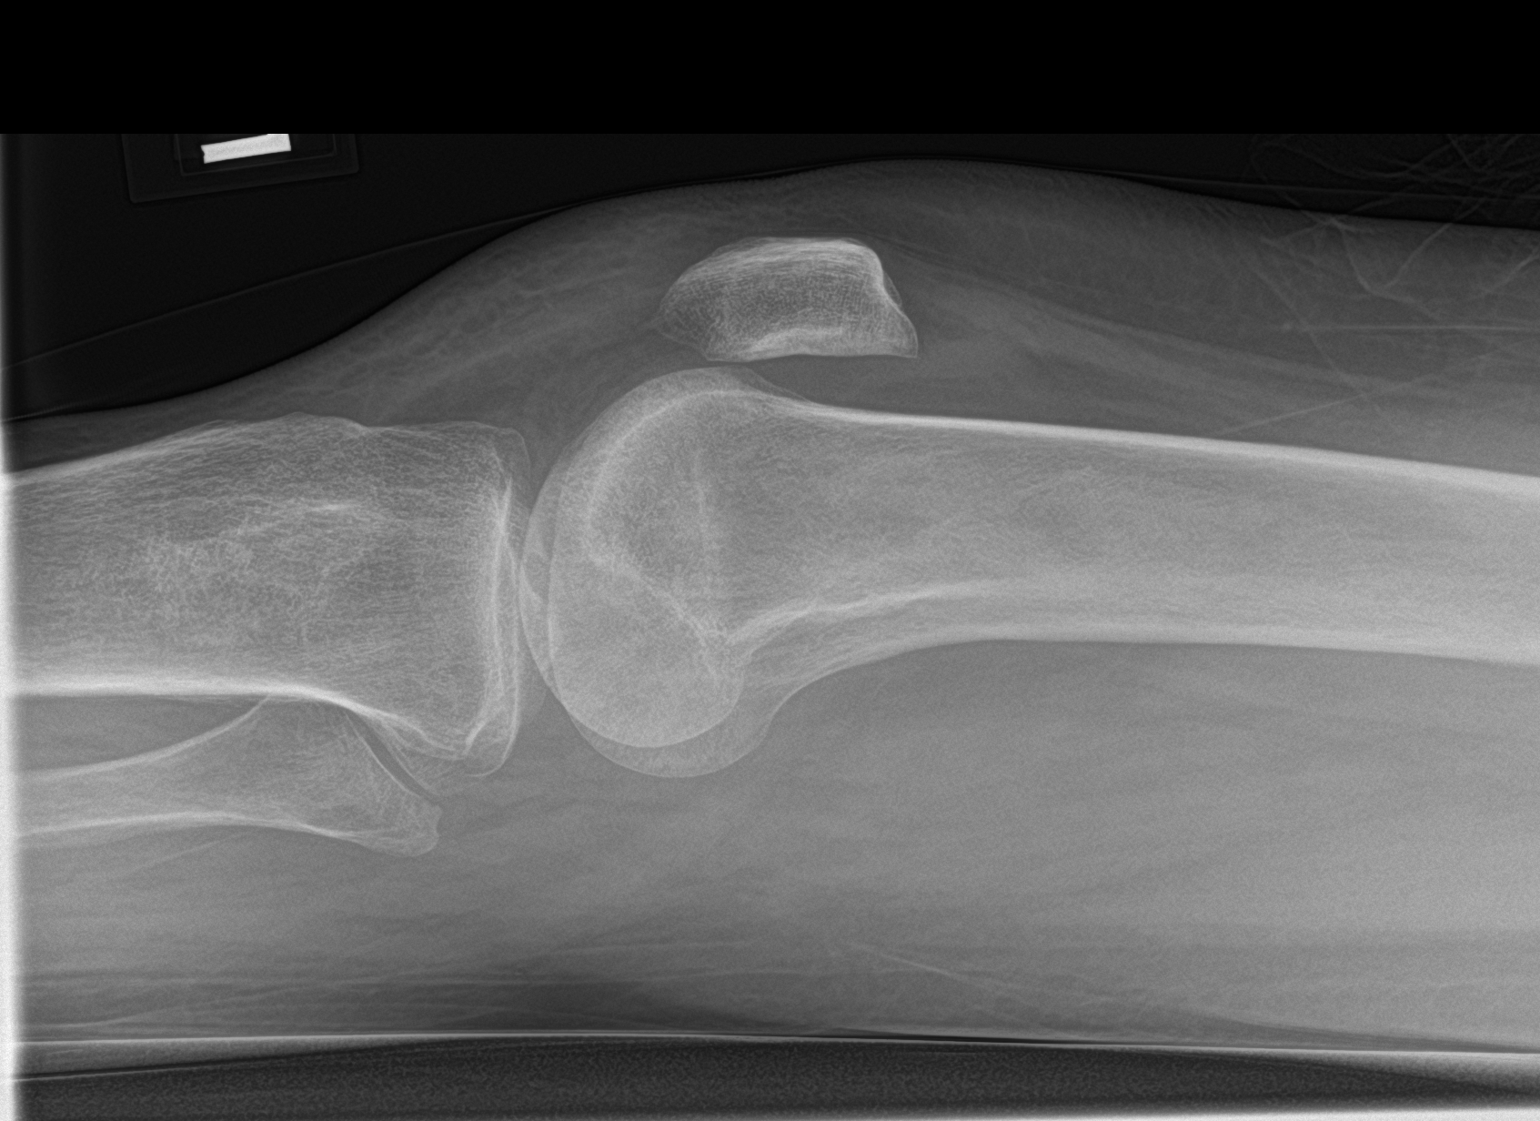

[2 of 2 positions shown; findings below may reference images not displayed]

FINDINGS: No fracture or malalignment. Mild patellofemoral and medial joint
space degenerative change. Trace knee effusion. Mild sclerosis in
the proximal metaphysis of the tibia, likely corresponding to MRI
demonstrated bone infarct.
IMPRESSION: 1. No acute osseous abnormality
2. Mild arthritis of the knee with small knee effusion

## 2021-06-06 IMAGING — CR DG PELVIS 1-2V
1 series · 1 of 1 positions shown · non-contrast
Comparison: 12/15/2017

CLINICAL DATA: Bilateral pelvic and knee pain after multiple recent
falls.

EXAM:
PELVIS - 1-2 VIEW

[dg pelvis 1-2 views]
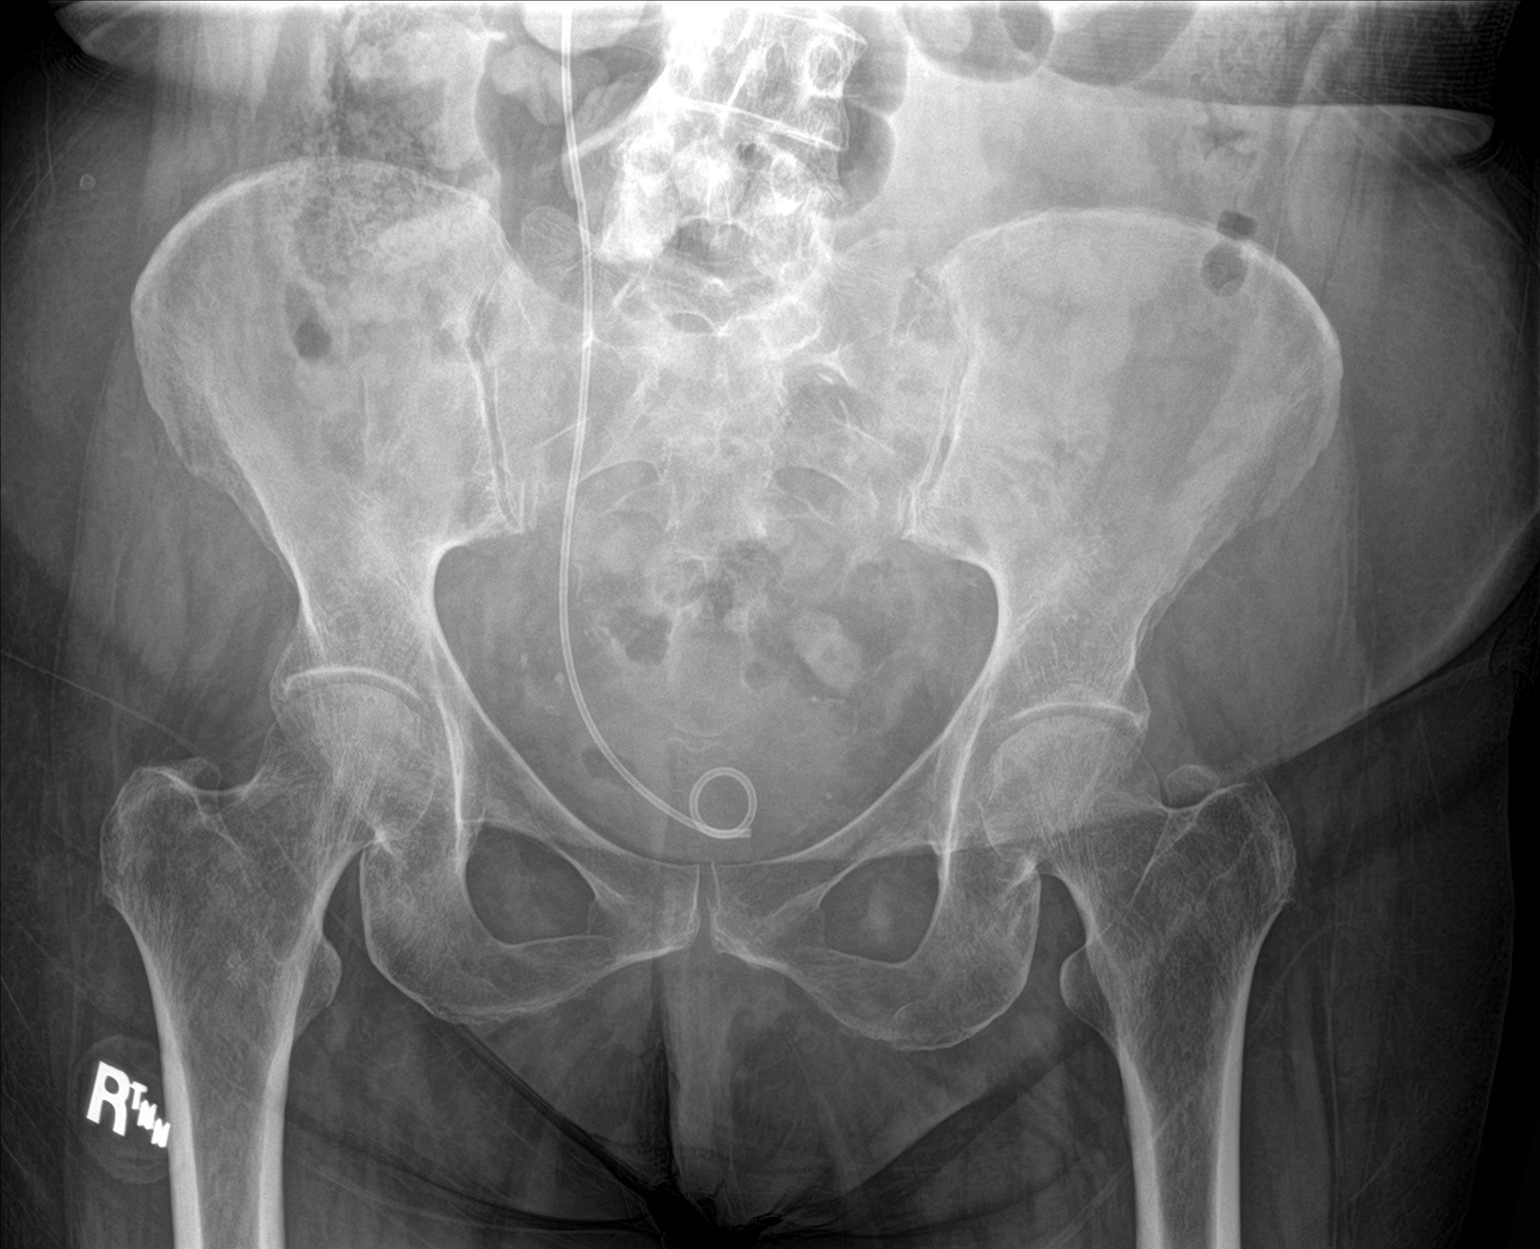

[1 of 1 positions shown; findings below may reference images not displayed]

FINDINGS: A right ureteral stent is again partially visualized terminating in
the midline of the lower pelvis in the expected region of the
bladder, unchanged. No acute fracture or pelvic diastasis is
identified. Lumbar levoscoliosis is partially visualized.
IMPRESSION: No acute osseous abnormality identified.

## 2021-06-06 IMAGING — DX DG CHEST 1V PORT
1 series · 1 of 1 positions shown · non-contrast
Comparison: 11/26/2018

CLINICAL DATA: Worsening weakness.

EXAM:
PORTABLE CHEST 1 VIEW

[chest ap]
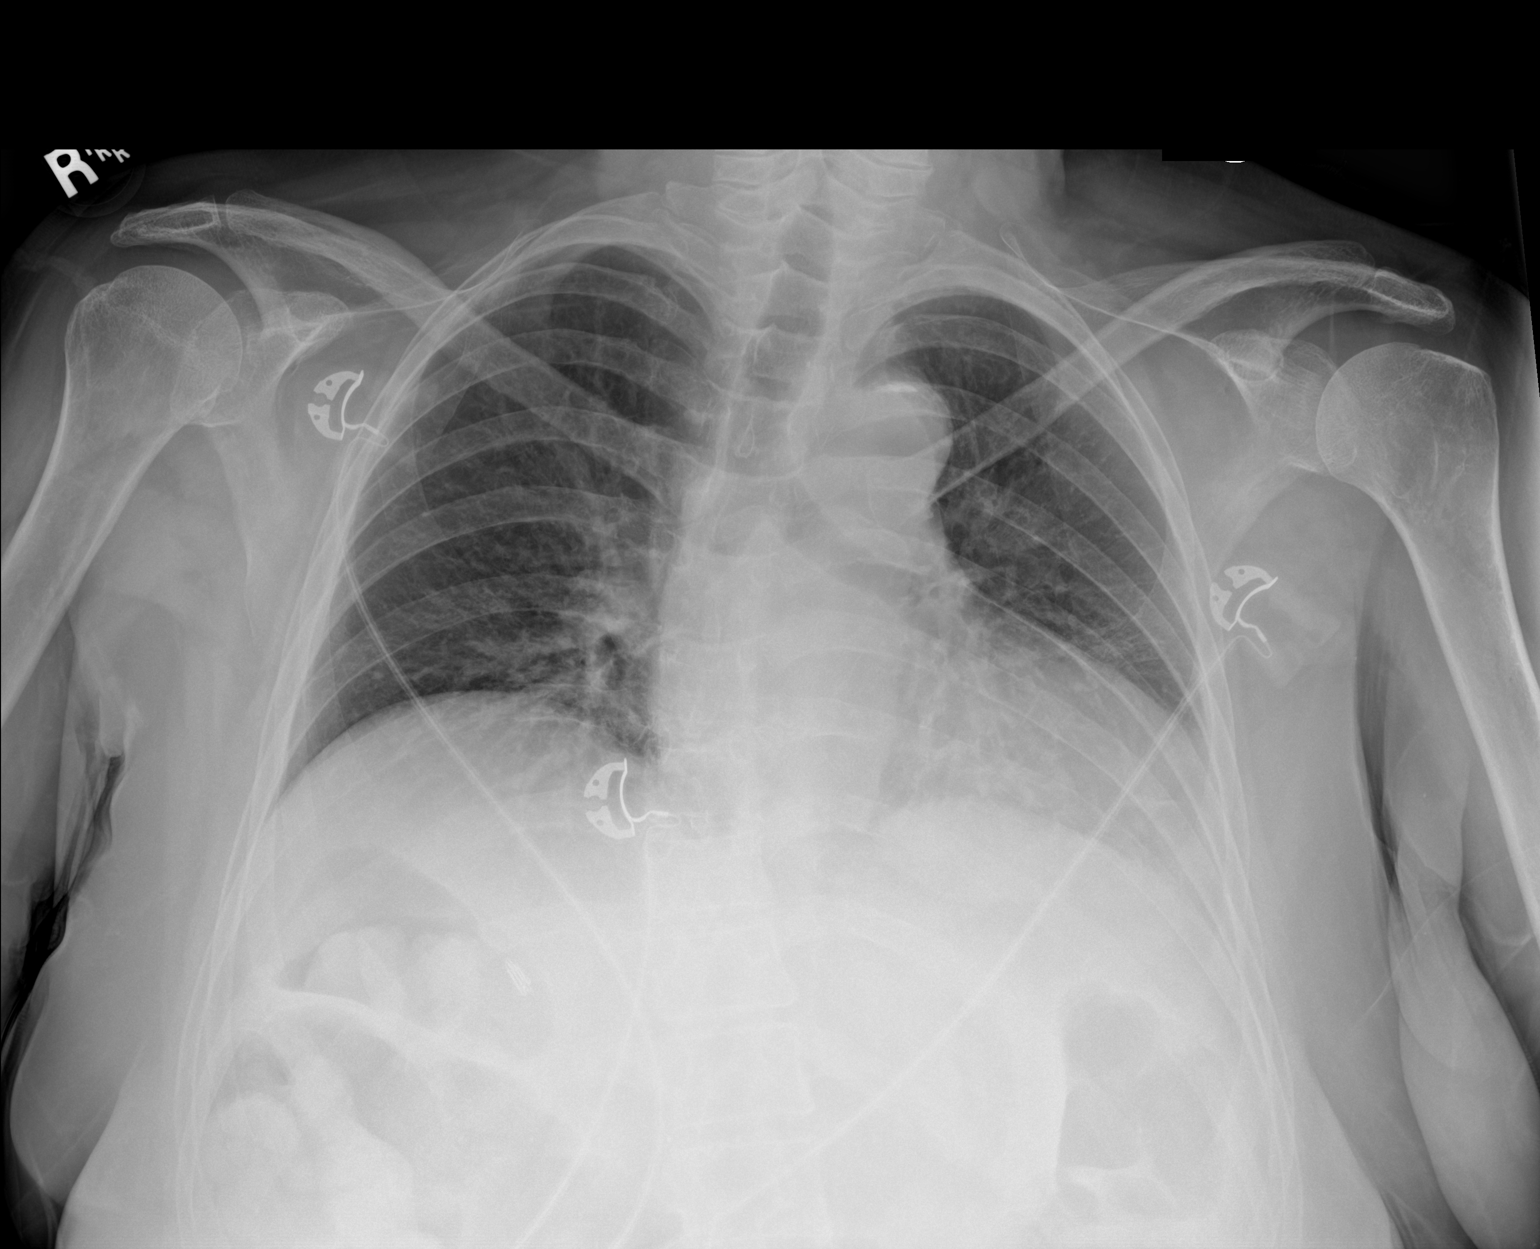

[1 of 1 positions shown; findings below may reference images not displayed]

FINDINGS: The cardiac silhouette, mediastinal and hilar contours are within
normal limits given the AP projection, portable technique and low
lung volumes. Mild tortuosity and calcification of the thoracic
aorta.

Low lung volumes with vascular crowding and streaky basilar
atelectasis. No infiltrates or effusions. No pulmonary lesions. The
bony thorax is intact.
IMPRESSION: Low lung volumes with vascular crowding and atelectasis but no
definite infiltrates or effusions.

## 2021-06-06 IMAGING — CR DG KNEE 1-2V*R*
1 series · 2 of 2 positions shown · non-contrast
Comparison: 01/08/2007

CLINICAL DATA: Bilateral knee pain after fall

EXAM:
RIGHT KNEE - 1-2 VIEW

[Series 1: dg knee 1-2 views right · 0.14mm/px · 2 of 2 slices shown]
[im 1/2]
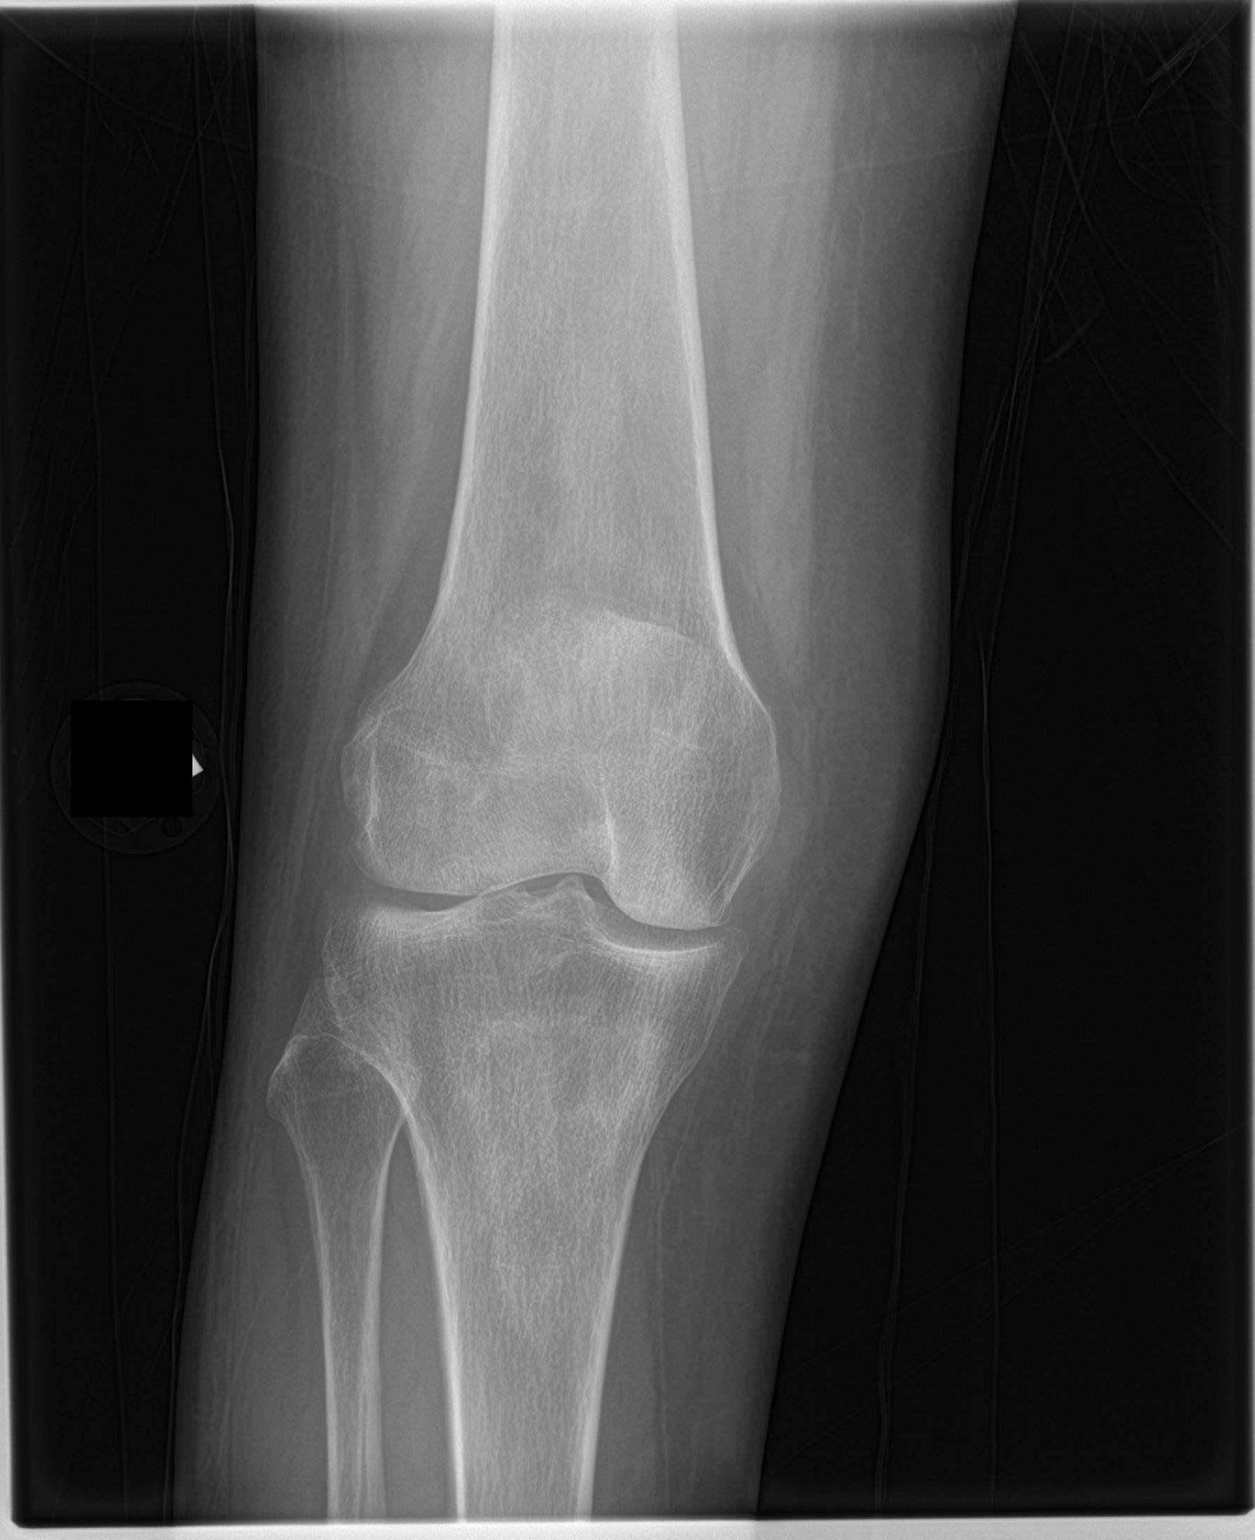
[im 2/2]
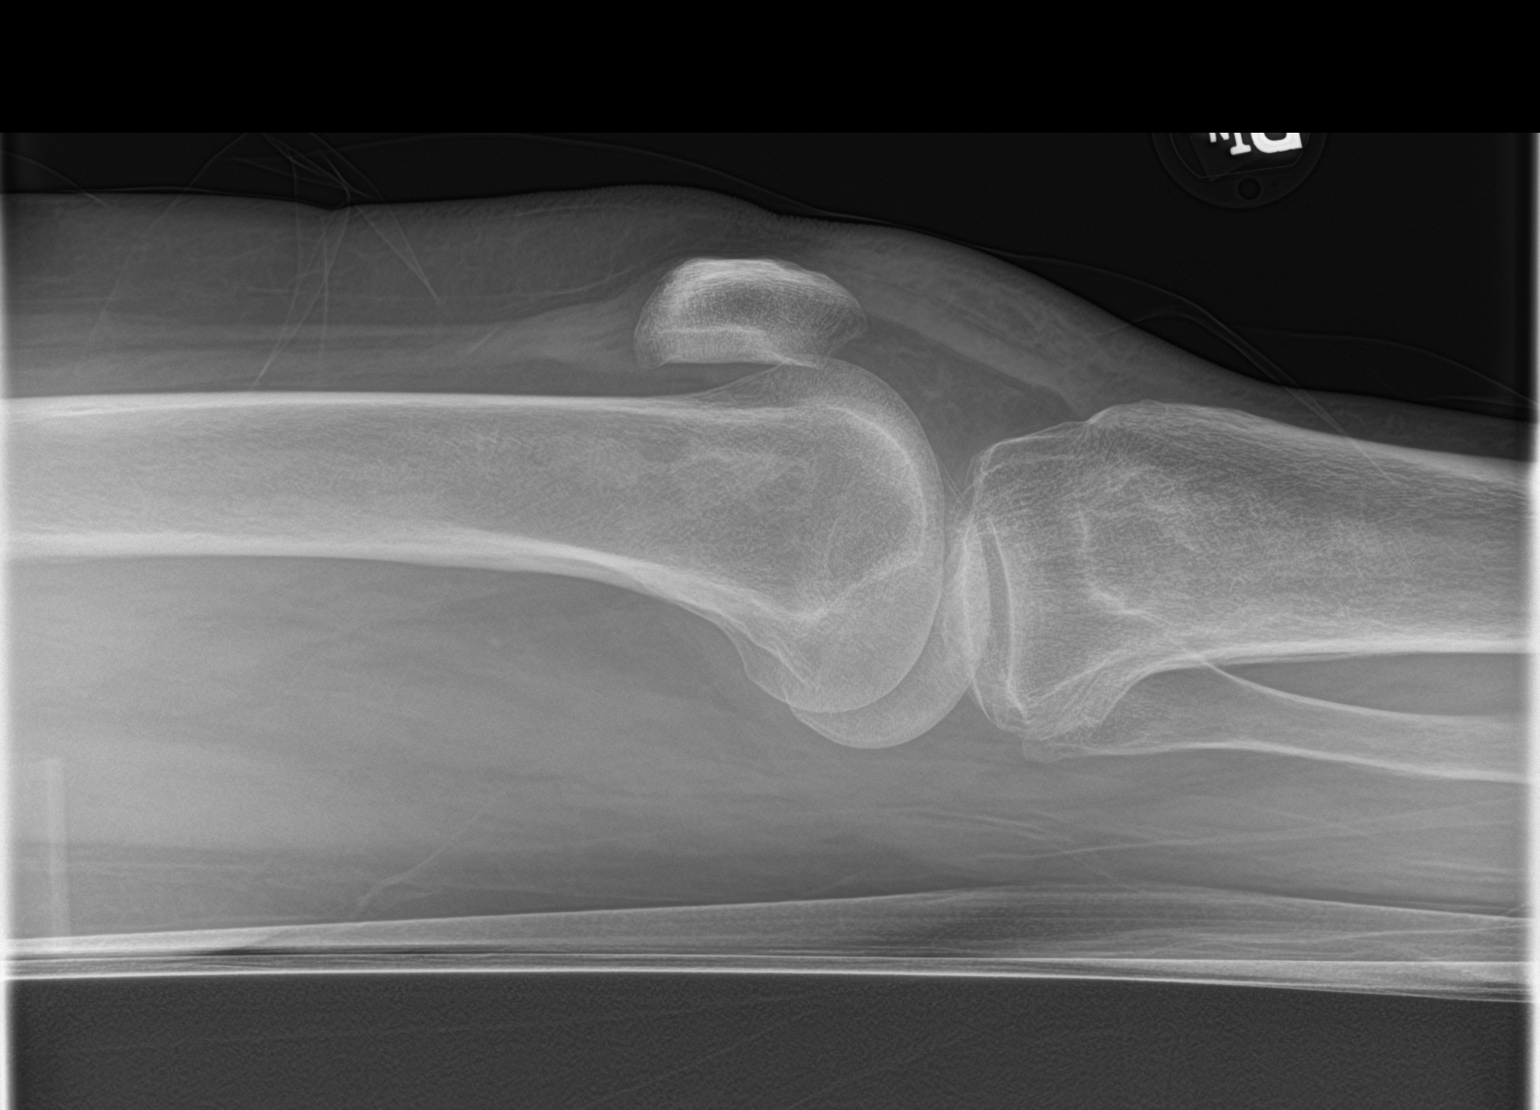

[2 of 2 positions shown; findings below may reference images not displayed]

FINDINGS: No acute displaced fracture or malalignment. No significant knee
effusion. Mild medial joint space degenerative change.
IMPRESSION: No acute osseous abnormality

## 2021-06-06 IMAGING — CT CT HEAD W/O CM
3 series · 15 of 47 positions shown, 18 images · non-contrast
Comparison: Head CT 05/22/2018

CLINICAL DATA: Altered level of consciousness. History of multiple
falls.

EXAM:
CT HEAD WITHOUT CONTRAST
TECHNIQUE: Contiguous axial images were obtained from the base of the skull
through the vertex without intravenous contrast.

[Series 2: head wo · axial · 0.47mm/px · z∈[-146,-16]mm · 9 of 32 slices shown, 12 images]
[im 3/32  brain]
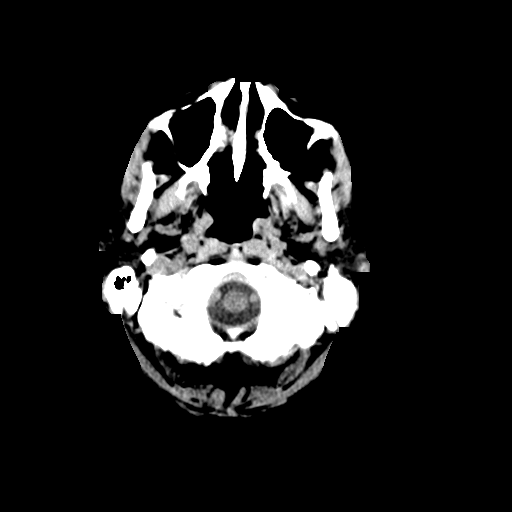
[im 3/32  bone]
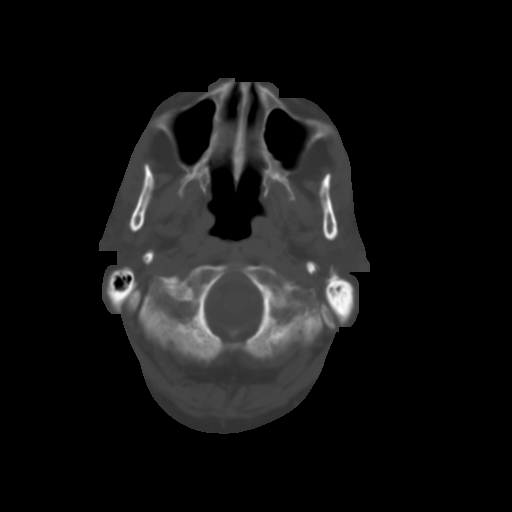
[im 6/32  brain]
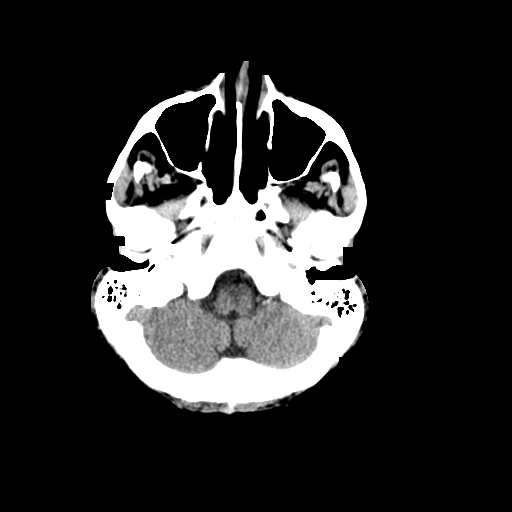
[im 9/32  brain]
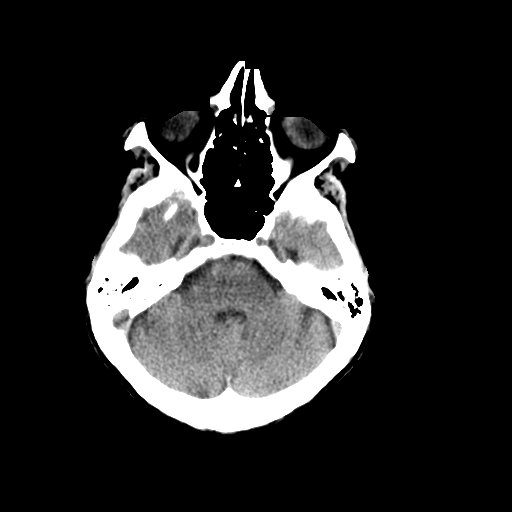
[im 12/32  brain]
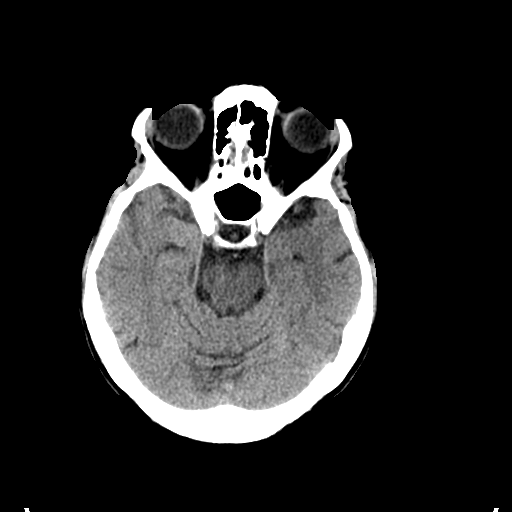
[im 17/32  brain]
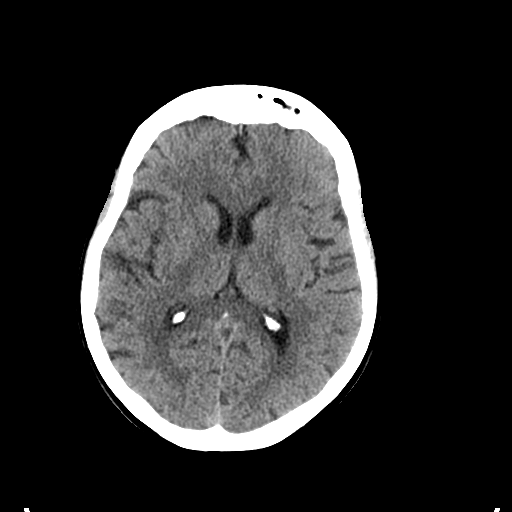
[im 17/32  bone]
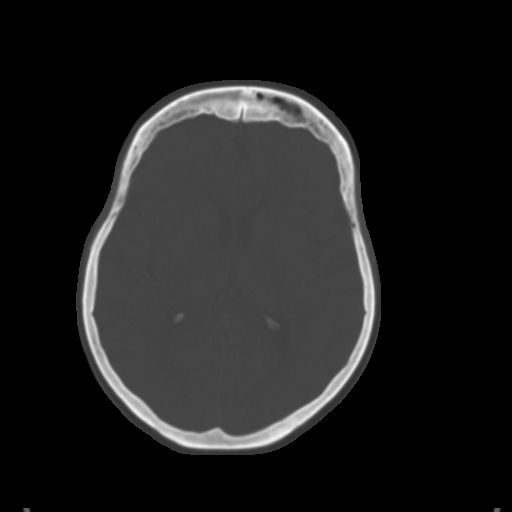
[im 20/32  brain]
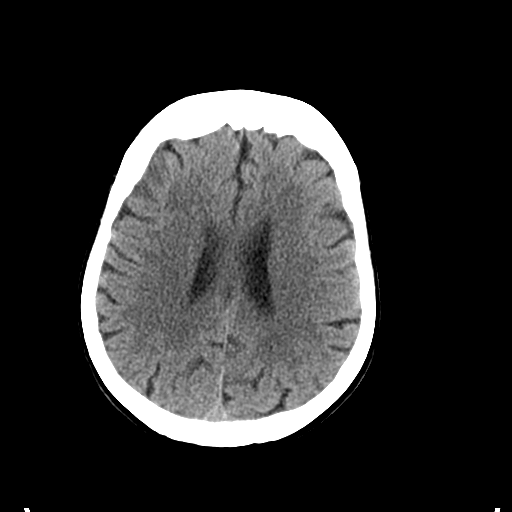
[im 23/32  brain]
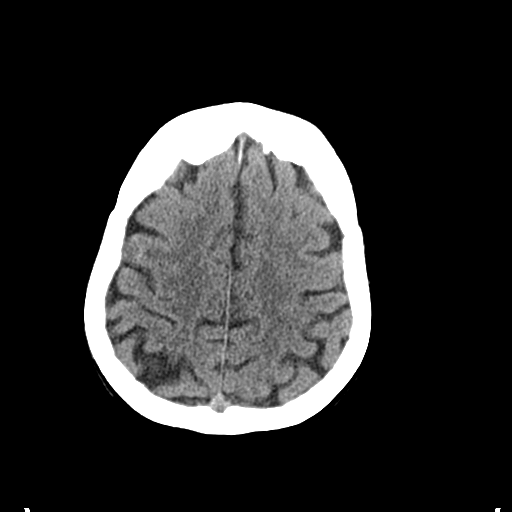
[im 26/32  brain]
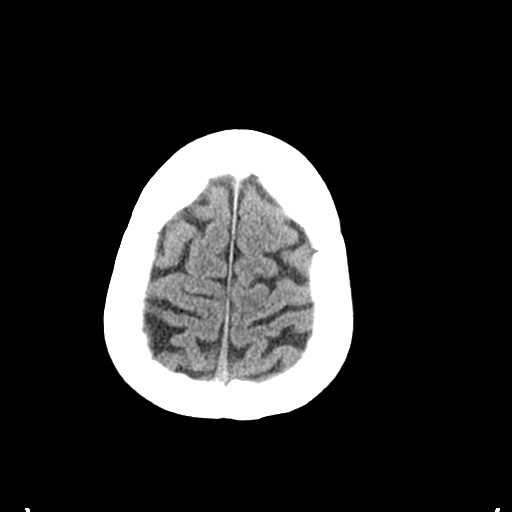
[im 29/32  brain]
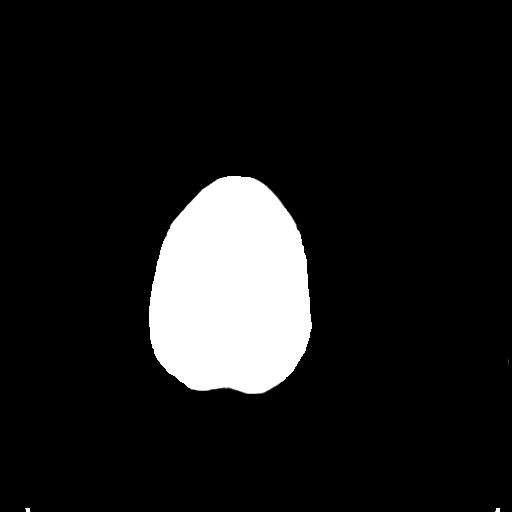
[im 29/32  bone]
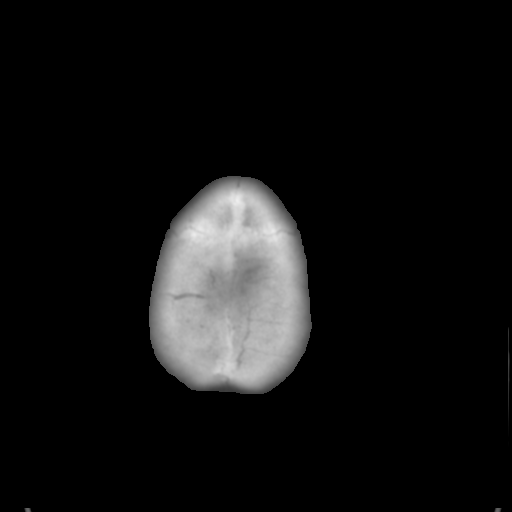

[Series 4: coronal soft tissue · coronal · 0.30mm/px · 3 of 65 slices shown]
[im 22/65  brain]
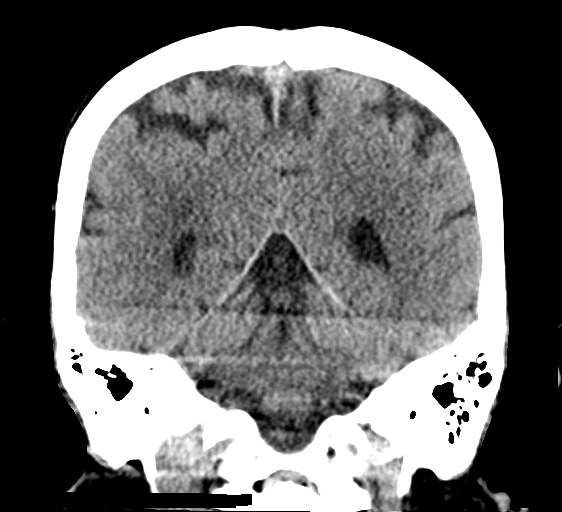
[im 29/65  brain]
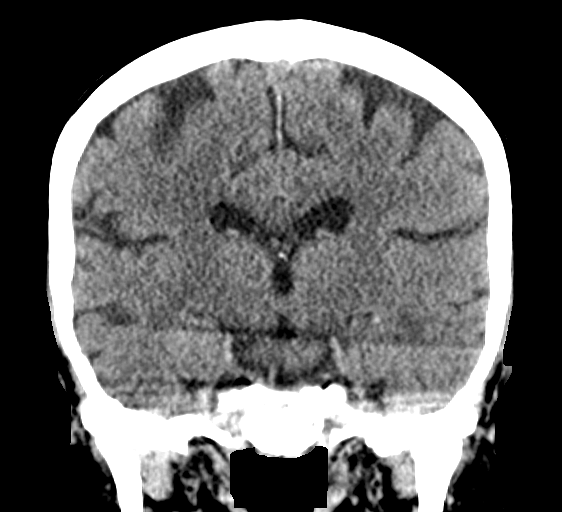
[im 36/65  brain]
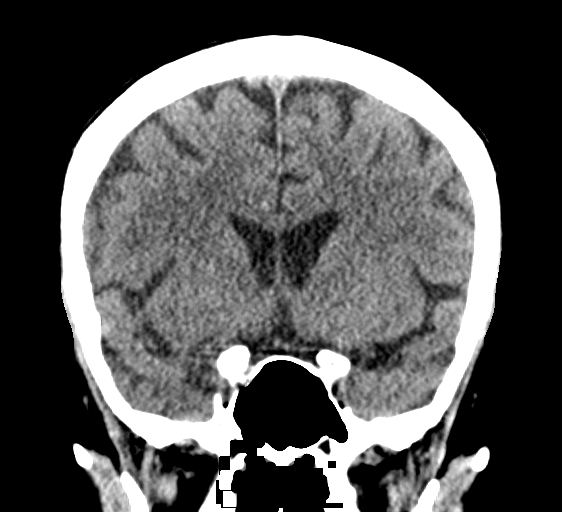

[Series 5: sagittal soft tissue · sagittal · 0.30mm/px · 3 of 57 slices shown]
[im 19/57  brain]
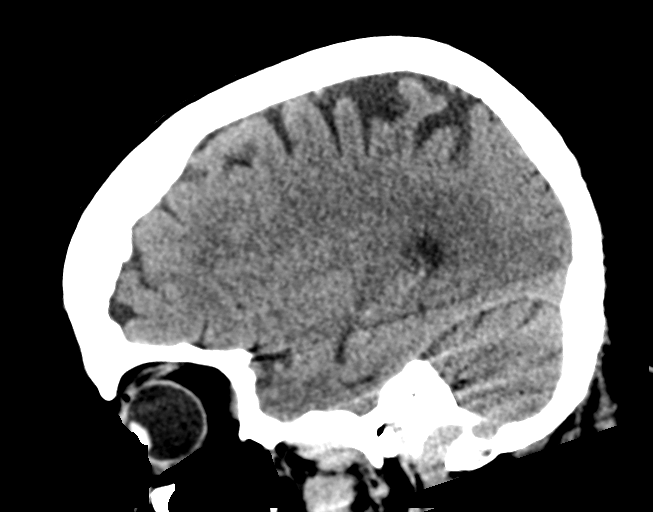
[im 29/57  brain]
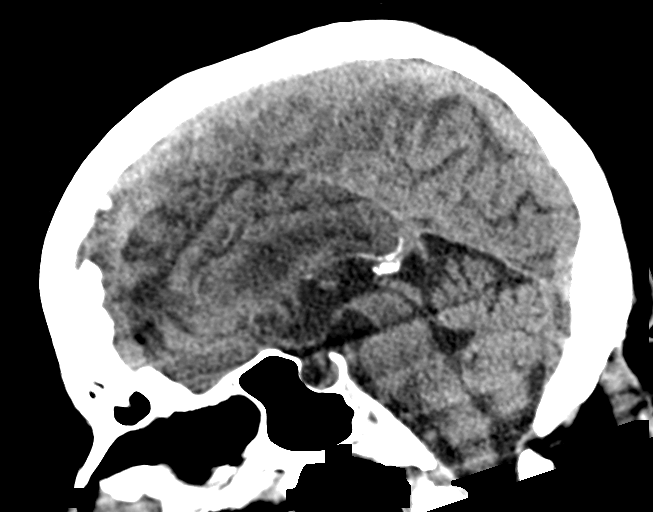
[im 38/57  brain]
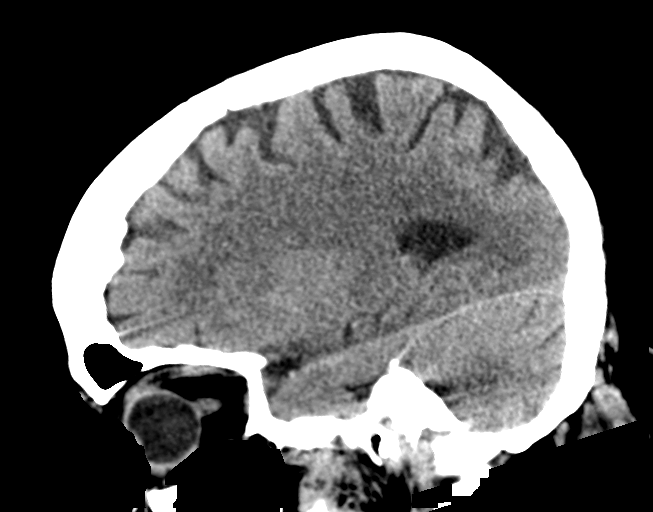

[15 of 47 positions shown; findings below may reference images not displayed]

FINDINGS: Brain: The ventricles are normal in size and configuration. No
extra-axial fluid collections are identified. The gray-white
differentiation is maintained. No CT findings for acute hemispheric
infarction or intracranial hemorrhage. No mass lesions. The
brainstem and cerebellum are normal.

Vascular: No hyperdense vessels or obvious aneurysm.

Skull: No acute skull fracture. No bone lesion. Hyperostosis
frontalis interna again noted.

Sinuses/Orbits: The paranasal sinuses and mastoid air cells are
clear. The globes are intact.

Other: No scalp lesions, laceration or hematoma.
IMPRESSION: No acute intracranial findings or mass lesion.

## 2021-06-07 IMAGING — US US RENAL
1 series · 14 of 25 positions shown · non-contrast
Comparison: 05/16/2019

CLINICAL DATA: Acute renal insufficiency. Ureteral stent 07/14/2016

EXAM:
RENAL / URINARY TRACT ULTRASOUND COMPLETE

[Series 1: us renal · 14 of 31 slices shown]
[im 1/31]
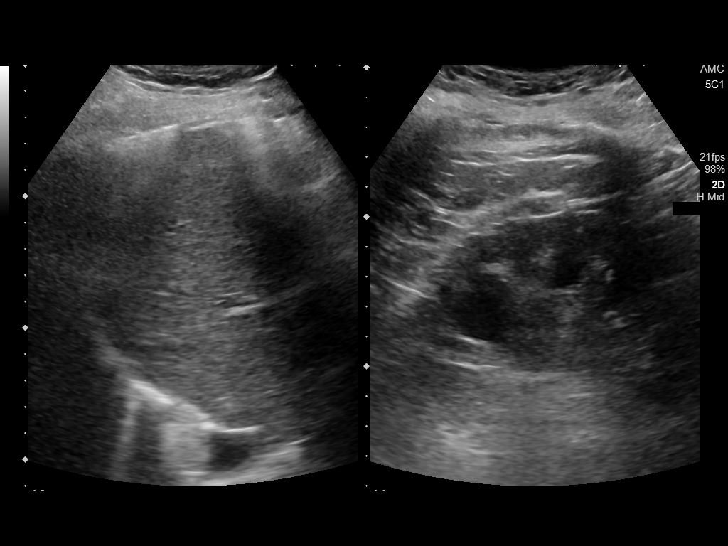
[im 3/31]
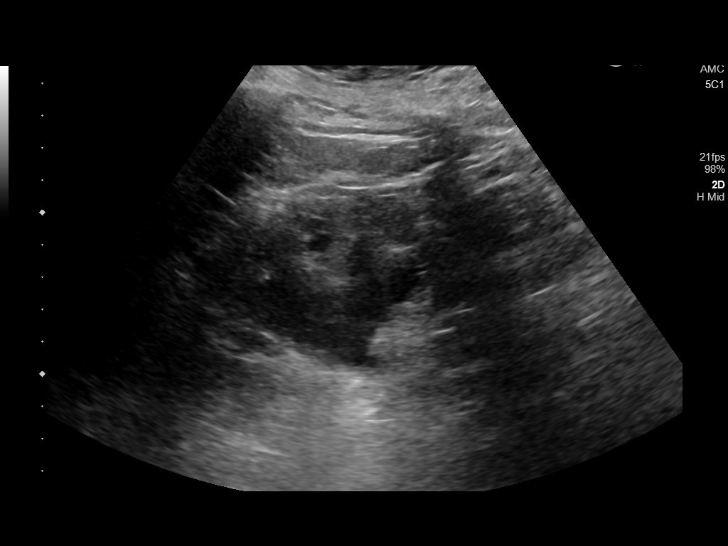
[im 6/31]
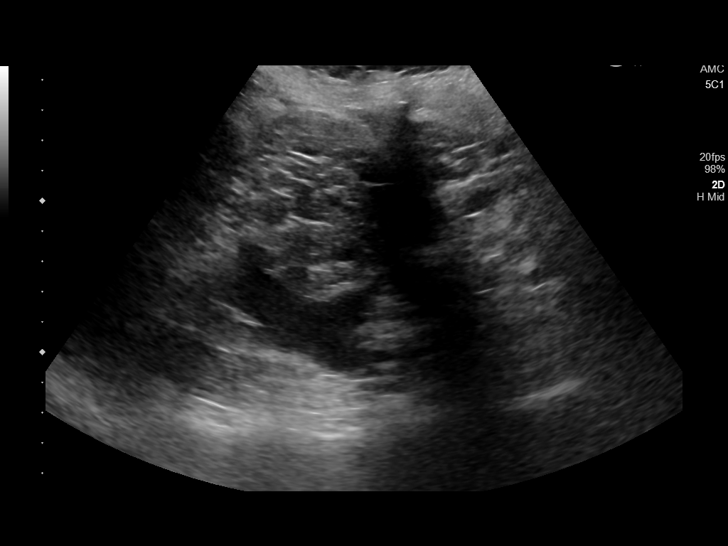
[im 8/31]
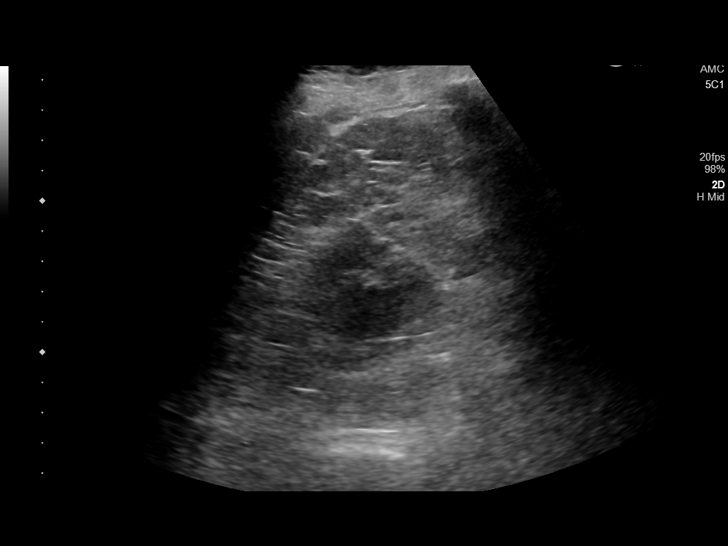
[im 11/31]
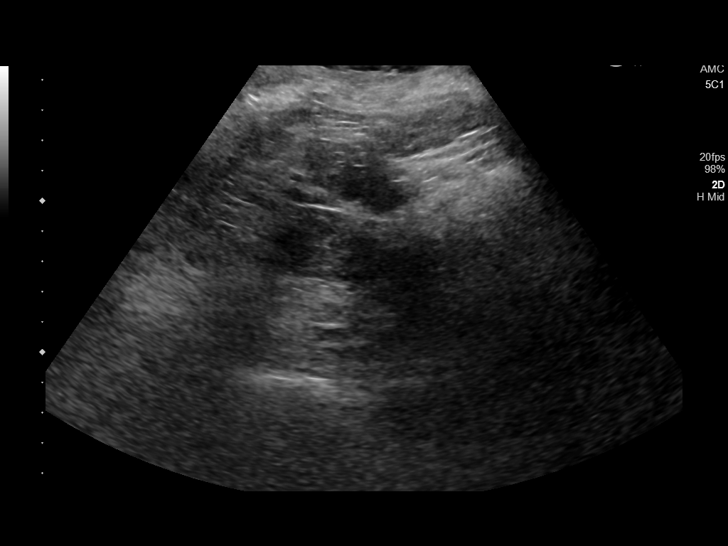
[im 12/31]
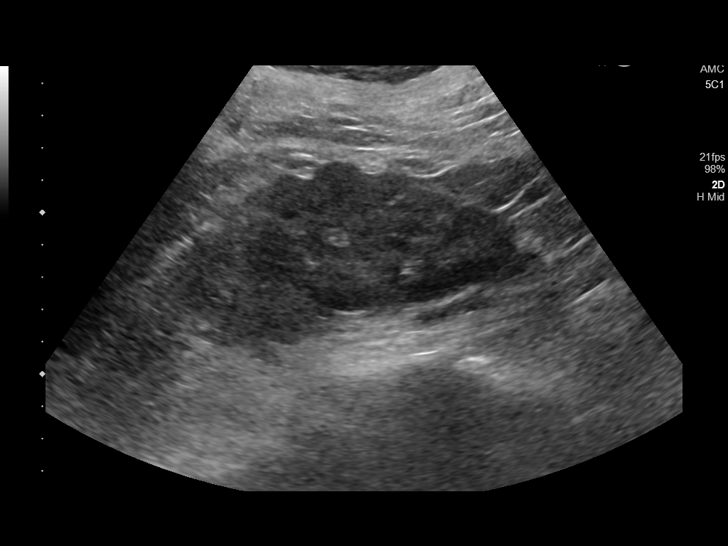
[im 14/31]
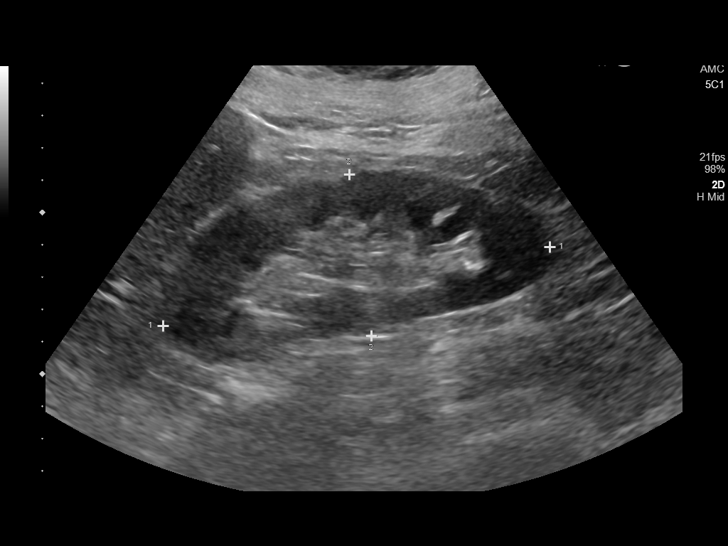
[im 17/31]
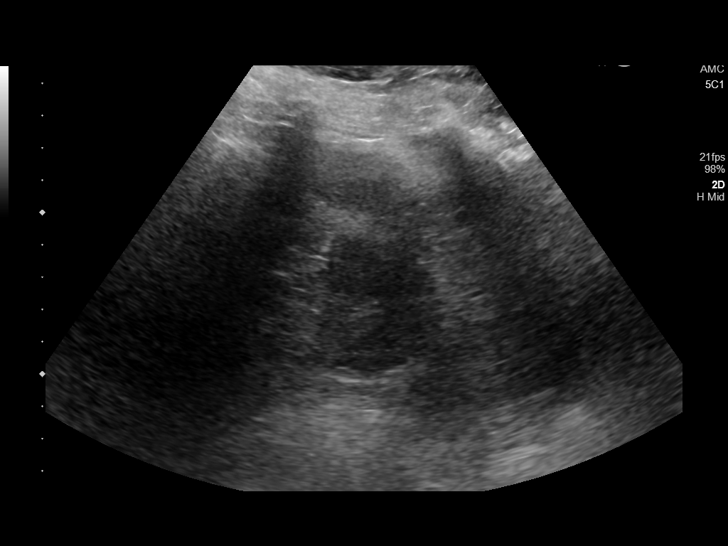
[im 19/31]
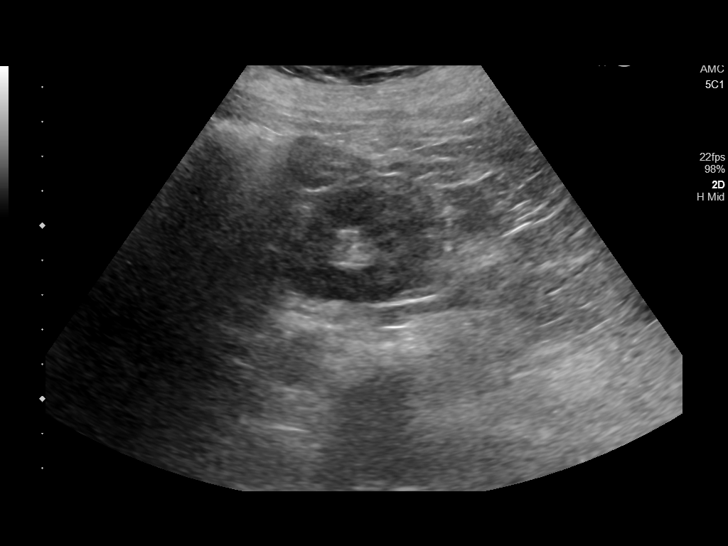
[im 21/31]
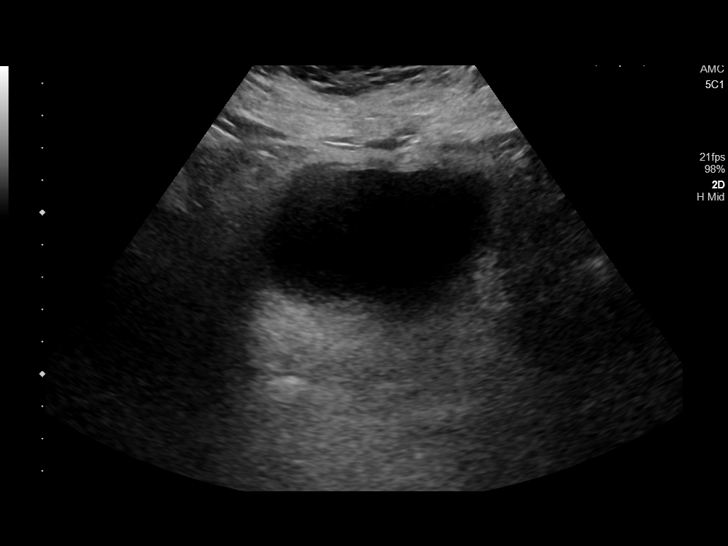
[im 23/31]
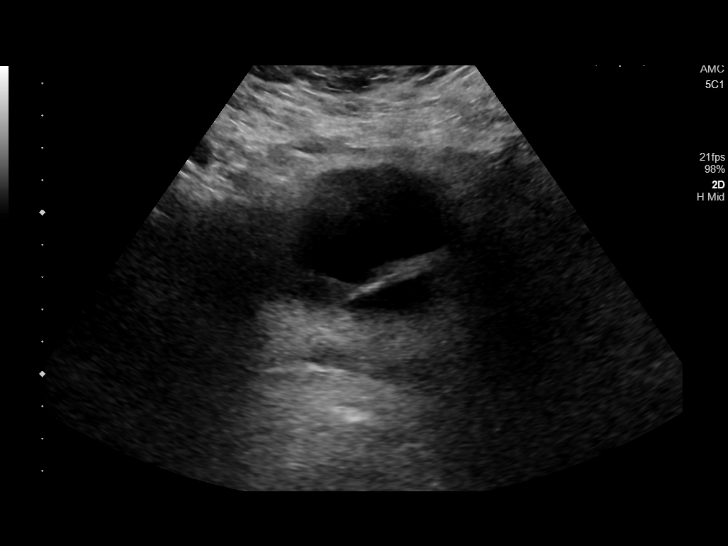
[im 26/31]
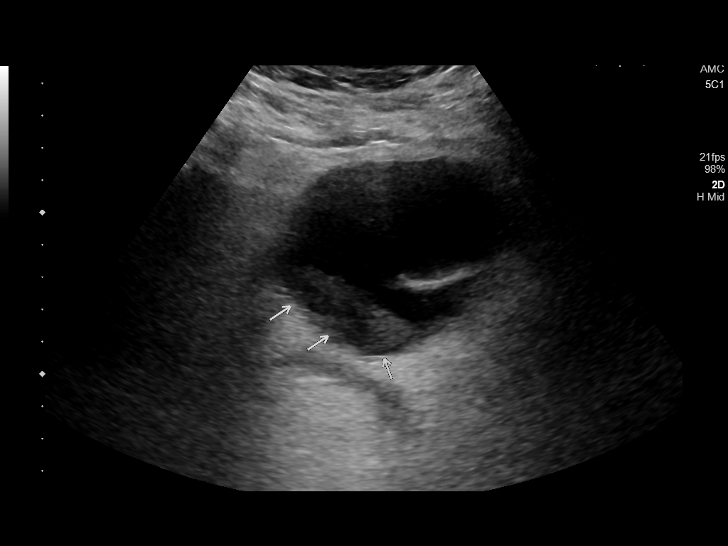
[im 28/31]
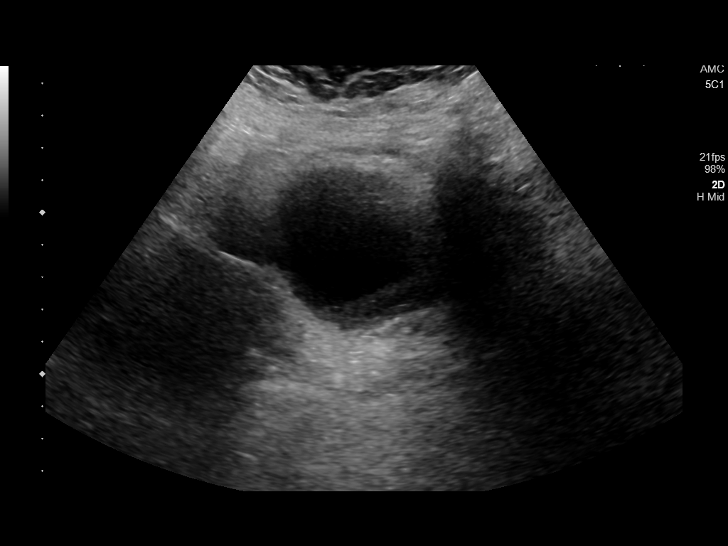
[im 31/31]
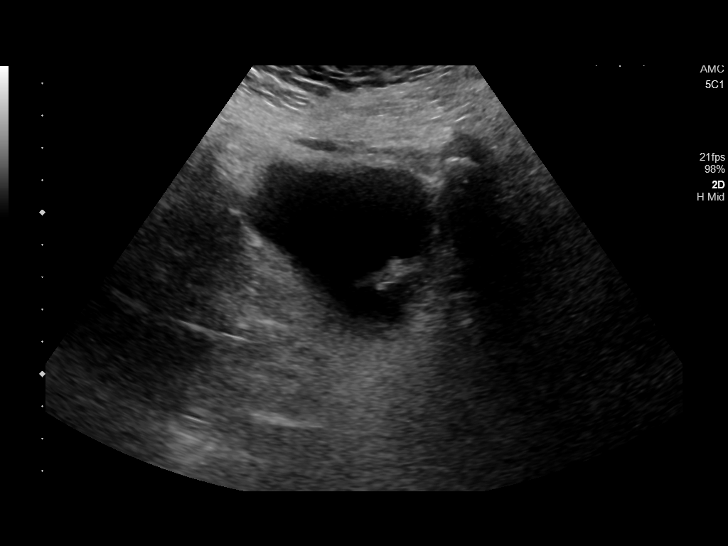

[14 of 25 positions shown; findings below may reference images not displayed]

FINDINGS: Right Kidney:

Renal measurements: 8.8 x 4.7 x 4.8 cm = volume: 104 mL .
Echogenicity within normal limits. No mass visualized. Mild
right-sided hydronephrosis slightly worse. Known right ureteral
stent difficult to visualize.

Left Kidney:

Renal measurements: 12.2 x 5.0 x 5.0 cm = volume: 162 mL.
Echogenicity within normal limits. No mass or hydronephrosis
visualized.

Bladder:

Bladder mildly full with presence of ureteral stent visualized. Mild
dependent mobile debris unchanged.

Other:

None.
IMPRESSION: Right kidney slightly smaller than left. Mild right-sided
hydronephrosis slightly worse.

Mild dependent mobile debris over the bladder unchanged.
Visualization of distal portion of patient's ureteral stent over the
bladder.

## 2021-06-09 IMAGING — DX DG CHEST 1V PORT
1 series · 1 of 1 positions shown · non-contrast
Comparison: 07/14/2019

CLINICAL DATA: Cough 1 day.

EXAM:
PORTABLE CHEST 1 VIEW

[chest ap]
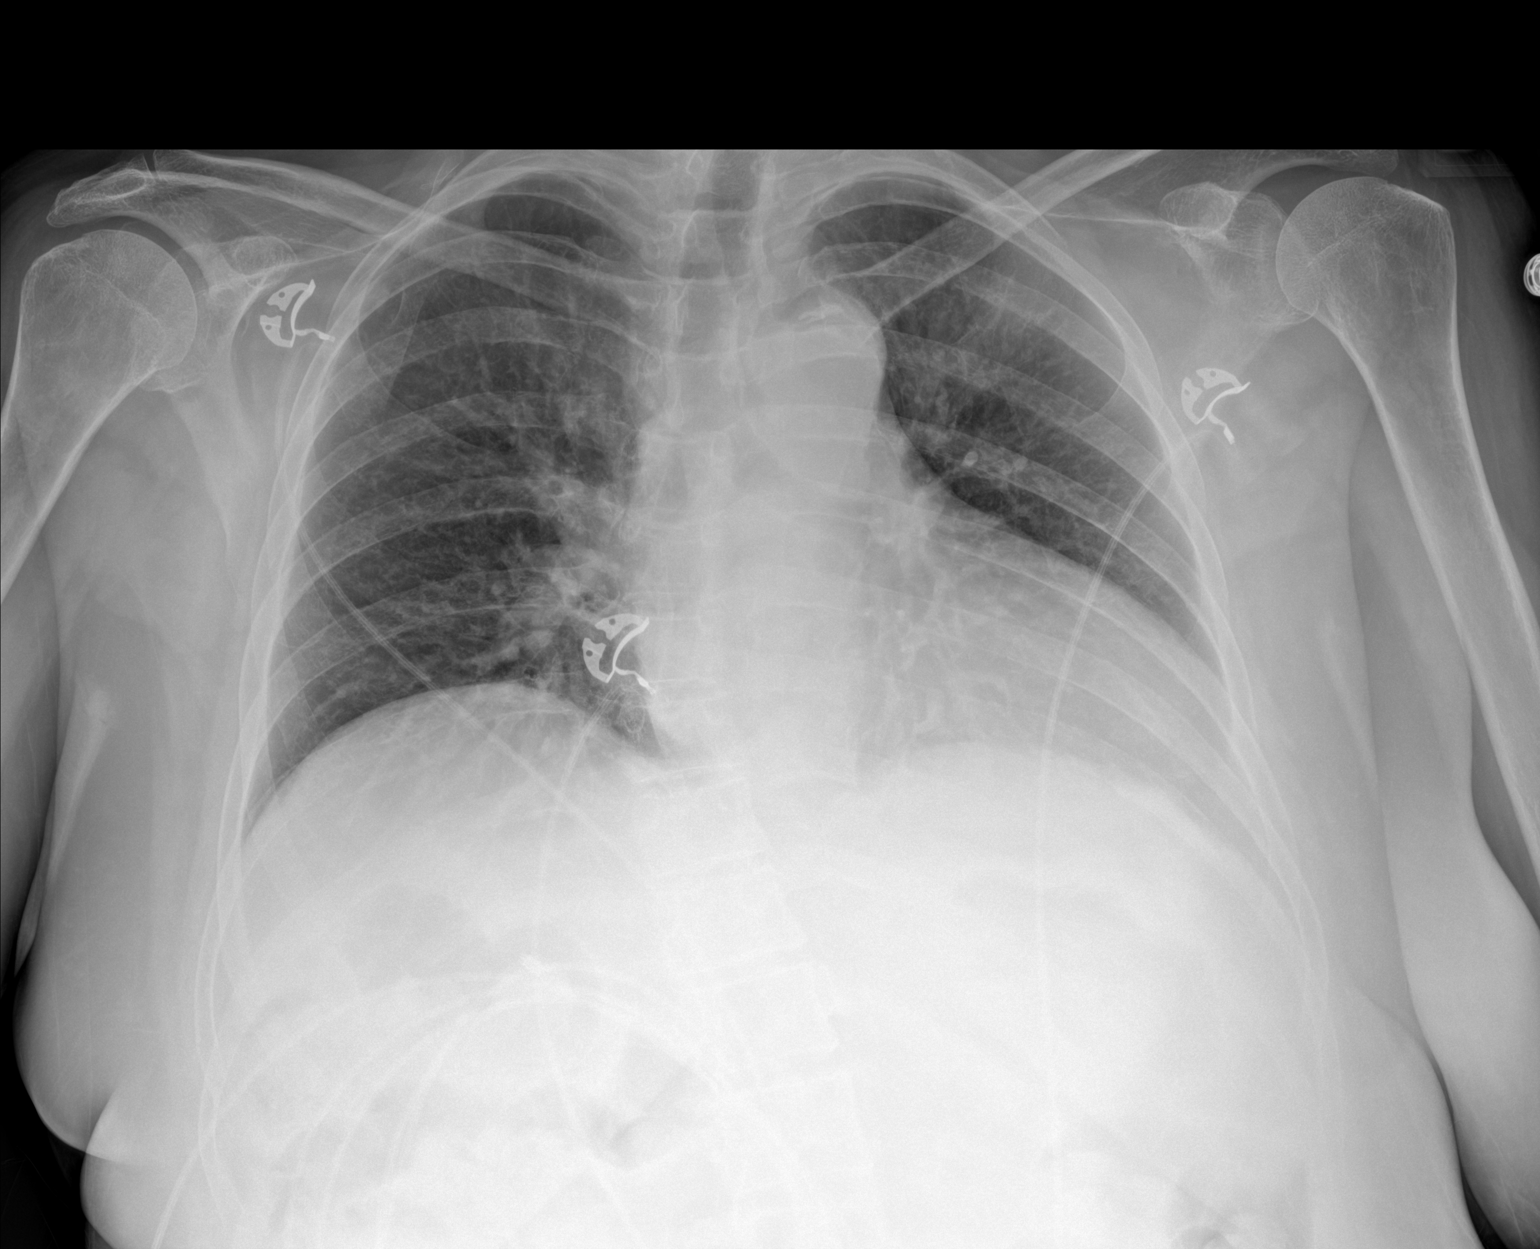

[1 of 1 positions shown; findings below may reference images not displayed]

FINDINGS: Lungs are hypoinflated without focal lobar consolidation or
effusion. Subtle prominence of the perihilar markings likely due to
the degree of hypoinflation and unchanged. Cardiomediastinal
silhouette and remainder of the exam is unchanged.
IMPRESSION: Hypoinflation without acute cardiopulmonary disease.

## 2021-06-14 ENCOUNTER — Other Ambulatory Visit: Payer: Self-pay

## 2021-06-14 ENCOUNTER — Ambulatory Visit (INDEPENDENT_AMBULATORY_CARE_PROVIDER_SITE_OTHER): Payer: Medicare Other | Admitting: Primary Care

## 2021-06-14 ENCOUNTER — Encounter: Payer: Self-pay | Admitting: Primary Care

## 2021-06-14 VITALS — BP 148/86 | HR 87 | Temp 97.8°F | Ht 62.0 in | Wt 156.0 lb

## 2021-06-14 DIAGNOSIS — M5442 Lumbago with sciatica, left side: Secondary | ICD-10-CM | POA: Diagnosis not present

## 2021-06-14 DIAGNOSIS — I951 Orthostatic hypotension: Secondary | ICD-10-CM | POA: Diagnosis not present

## 2021-06-14 DIAGNOSIS — I480 Paroxysmal atrial fibrillation: Secondary | ICD-10-CM | POA: Diagnosis not present

## 2021-06-14 DIAGNOSIS — Z91199 Patient's noncompliance with other medical treatment and regimen due to unspecified reason: Secondary | ICD-10-CM

## 2021-06-14 DIAGNOSIS — I10 Essential (primary) hypertension: Secondary | ICD-10-CM | POA: Diagnosis not present

## 2021-06-14 DIAGNOSIS — N1832 Chronic kidney disease, stage 3b: Secondary | ICD-10-CM | POA: Diagnosis not present

## 2021-06-14 DIAGNOSIS — Z794 Long term (current) use of insulin: Secondary | ICD-10-CM | POA: Diagnosis not present

## 2021-06-14 DIAGNOSIS — G8929 Other chronic pain: Secondary | ICD-10-CM

## 2021-06-14 DIAGNOSIS — E1165 Type 2 diabetes mellitus with hyperglycemia: Secondary | ICD-10-CM

## 2021-06-14 DIAGNOSIS — M5441 Lumbago with sciatica, right side: Secondary | ICD-10-CM

## 2021-06-14 LAB — POCT GLYCOSYLATED HEMOGLOBIN (HGB A1C): Hemoglobin A1C: 8.4 % — AB (ref 4.0–5.6)

## 2021-06-14 MED ORDER — GLIPIZIDE ER 5 MG PO TB24: 5.0000 mg | ORAL_TABLET | Freq: Every day | ORAL | 0 refills | Status: AC

## 2021-06-14 MED ORDER — CARVEDILOL 3.125 MG PO TABS
3.1250 mg | ORAL_TABLET | Freq: Two times a day (BID) | ORAL | 0 refills | Status: AC
Start: 2021-06-14 — End: ?

## 2021-06-14 NOTE — Assessment & Plan Note (Addendum)
Rate and rhythm regular today.  Refill provided for carvedilol 3.125 mg twice daily.    She is being dismissed today, discussed this with patient in great detail.  She understands that she will need to receive further refills from either her cardiologist or her new PCP.  She verbalized understanding. Dismissal letter handed to patient today.

## 2021-06-14 NOTE — Assessment & Plan Note (Signed)
Unfortunately this has been a recurrent issue for years, not only with myself (PCP) but with other providers as well.  She has been provided with numerous chances and notifications regarding the importance of medical follow-up over the last 6 years.  There is a long documentation trial of inability to contact patient, appointment cancellations, no-show appointments, failure to follow-up as recommended per hospitalists post discharge.  Our clinic has also attempted to provide her with transportation and connect her to social services.  We have contacted Adult Protective Services as well were unable to connect with patient.  Today we discussed that we will be dismissing the patient given the reasons mentioned above.  Dismissal letter was handed to her today.  She had ample time to ask questions, all of which were answered.  We offered to set her up with our community care nurse so that our nurse can help get her connected to the best care possible.  We asked for a working phone number for which she was unable to provide.  She notified us that she would call us back tomorrow with this working phone number so that we can ensure she is cared for.  We will provide medical care for her for the next 30 days, after this she will need to seek care from her new PCP.  All prescriptions that were filled today were filled for 30-day supply.  She is aware and verbalized understanding.

## 2021-06-14 NOTE — Assessment & Plan Note (Signed)
Hypertensive today in the office, also with repeat blood pressure.  Refill provided for carvedilol 3.125 mg twice daily, 30-day supply.  She is being dismissed today, discussed this with patient in great detail.  She understands that she will need to receive further refills from either her cardiologist or her new PCP.  She verbalized understanding. Dismissal letter handed to patient today.

## 2021-06-14 NOTE — Progress Notes (Signed)
Subjective:    Patient ID: [name], female    DOB: 02-26-1956, 66 y.o.   MRN: [id number]  HPI  [name] is a very pleasant 66 y.o. female with a significant medical history including medical non compliance, hypertension, CVA, hypotension, CAD with NSTEMI, PAF, uncontrolled type 2 diabetes, liver cirrhosis, hypoglycemia, chronic back pain who presents today to discuss medication refills.   She was last evaluated by me via telephone visit in February 2022. Has a long history of medical non compliance, no shows, cancelled appointments. She has recurrent ED visits and hospitalizations. Does not follow up as recommended by hospital physicians.  She is being dismissed today due to reasons mentioned above. There  is documentation of failed attempts to contact patient. There is also documentation where our office has set up transportation and social services for the patient.  1) Type 2 Diabetes:  Current medications include: Levemir and Humalog insulin. She endorses that she has not had these medications in 4 days.   She is checking her blood glucose 3 times daily and is getting readings of "20-600".   Last A1C: 9.0 in June 2022 during hospital stay. A1C of 8.4 today.  She has a history of hypoglycemia, endorses frequent readings of 40's and 50's. She's seen drops in the 20's 3-4 times this year.   2) CAD/Paroxysmal Atrial Fibrillation: Currently prescribed carvedilol 3.25 mg BID for rate control and hypertension. She does have a history of orthostasis and hypotension.   She has not had carvedilol in several months. Endorses chronic lower extremity edema from mid shin to her feet. She endorses a sedentary lifestyle, does not elevate her legs, mostly sits at home.  3) Chronic Back Pain/Chronic Pain Syndrome: Following with pain management. She is wanting to find another pain doctor as her prior pain doctor will not prescribe her narcotics.  She was told during her last visit that  her pain doctor wanted to start injections for her pain.  She became appalled to this as she endorses that she asked her pain doctor what was in the injections and was not provided with answer.  She has canceled numerous physical therapy appointments that her pain management physician set up for her.  She endorses chronic weakness due to her back pain, "I can hardly walk from the couch to the kitchen without pain".  She is asking for refill of her hydrocodone today   BP Readings from Last 3 Encounters:  06/14/21 (!) 148/86  03/07/21 128/62  02/27/21 (!) 126/57       Review of Systems  Constitutional:  Positive for fatigue.  Respiratory:  Negative for shortness of breath.   Cardiovascular:  Positive for leg swelling.  Psychiatric/Behavioral:  The patient is nervous/anxious.         Past Medical History:  Diagnosis Date   Acute encephalopathy 05/22/2018   Allergy    Anginal pain (HCC)    Anxiety    Ascites    C. difficile colitis 07/10/2015   Cancer ([place])    HX OF CANCER OF UTERUS    Chronic kidney disease    States she only has 1 working kidney   Cirrhosis of liver not due to alcohol ([place]) 2016   Degenerative disk disease    Diverticulitis    Gastroparesis    GERD (gastroesophageal reflux disease)    History of hiatal hernia    Hypertension    Hypothyroid    Hypothyroidism due to amiodarone    Ileus ([place])  08/01/2015   Intussusception intestine ([place]) 05/2015   Orthostatic hypotension    PAF (paroxysmal atrial fibrillation) ([place]) 03/2015   a. new onset 03/2015 in setting of intractable N/V; b. on Eliquis 5 mg bid; c. CHADSVASc 4 (DM, TIA x 2, female)   Pancreatitis    Pneumonia 11/14/2015   Pneumonia 07/2020   Right ureteral stone 07/14/2016   Sepsis ([place]) 12/15/2017   Sick sinus syndrome ([place])    Stomach ulcer    Stroke ([place])    with minimal left sided weakness   Syncope 01/2015   Syncope due to orthostatic hypotension 05/18/2015   Tachyarrhythmia 01/10/2016   TIA  (transient ischemic attack) 02/2015   Type 1 diabetes ([place])    on levemir   UTERINE CANCER, HX OF 03/27/2007   Qualifier: Diagnosis of  By: Maxie Better FNP, Rosalita Levan    UTI (urinary tract infection) 05/22/2018    Social History   Socioeconomic History   Marital status: Married    Spouse name: Jamilla Galli    Number of children: Not on file   Years of education: Not on file   Highest education level: Not on file  Occupational History   Occupation: Disabled 2nd back problems  Tobacco Use   Smoking status: Former    Types: Cigarettes   Smokeless tobacco: Never   Tobacco comments:    25 years ago and only smoked occasionally  Vaping Use   Vaping Use: Never used  Substance and Sexual Activity   Alcohol use: No   Drug use: No   Sexual activity: Not Currently  Other Topics Concern   Not on file  Social History Narrative   Lives in [place], [place] with her husband and 2 sons.   Social Determinants of Health   Financial Resource Strain: Not on file  Food Insecurity: Not on file  Transportation Needs: Not on file  Physical Activity: Not on file  Stress: Not on file  Social Connections: Not on file  Intimate Partner Violence: Not on file    Past Surgical History:  Procedure Laterality Date   ABDOMINAL HYSTERECTOMY     CARDIAC CATHETERIZATION N/A 01/12/2016   Procedure: Left Heart Cath and Coronary Angiography;  Surgeon: Wellington Hampshire, MD;  Location: [facility];  Service: Cardiovascular;  Laterality: N/A;   CHOLECYSTECTOMY     CYSTOSCOPY W/ RETROGRADES Bilateral 12/11/2019   Procedure: CYSTOSCOPY WITH RETROGRADE PYELOGRAM;  Surgeon: Billey Co, MD;  Location: [facility];  Service: Urology;  Laterality: Bilateral;   CYSTOSCOPY WITH BIOPSY N/A 12/11/2019   Procedure: CYSTOSCOPY WITH BLADDER BIOPSY;  Surgeon: Billey Co, MD;  Location: [facility];  Service: Urology;  Laterality: N/A;   CYSTOSCOPY WITH FULGERATION N/A 12/11/2019   Procedure: CYSTOSCOPY WITH  FULGERATION;  Surgeon: Billey Co, MD;  Location: [facility];  Service: Urology;  Laterality: N/A;   CYSTOSCOPY WITH STENT PLACEMENT Right 12/11/2019   Procedure: CYSTOSCOPY WITH STENT PLACEMENT;  Surgeon: Billey Co, MD;  Location: [facility];  Service: Urology;  Laterality: Right;   CYSTOSCOPY WITH URETHRAL DILATATION Right 12/11/2019   Procedure: CYSTOSCOPY WITH URETERAL DILATATION;  Surgeon: Billey Co, MD;  Location: [facility];  Service: Urology;  Laterality: Right;   CYSTOSCOPY/URETEROSCOPY/HOLMIUM LASER Right 07/14/2016   Procedure: CYSTOSCOPY/URETEROSCOPY/HOLMIUM LASER;  Surgeon: Alexis Frock, MD;  Location: [facility];  Service: Urology;  Laterality: Right;   ESOPHAGOGASTRODUODENOSCOPY N/A 04/04/2015   Procedure: ESOPHAGOGASTRODUODENOSCOPY (EGD);  Surgeon: Hulen Luster, MD;  Location[facility];  Service:  Endoscopy;  Laterality: N/A;   ESOPHAGOGASTRODUODENOSCOPY N/A 12/28/2017   Procedure: ESOPHAGOGASTRODUODENOSCOPY (EGD);  Surgeon: Lin Landsman, MD;  Location: [facility];  Service: Gastroenterology;  Laterality: N/A;   ESOPHAGOGASTRODUODENOSCOPY (EGD) WITH PROPOFOL N/A 01/18/2016   Procedure: ESOPHAGOGASTRODUODENOSCOPY (EGD) WITH PROPOFOL;  Surgeon: Lucilla Lame, MD;  Location: [facility];  Service: Endoscopy;  Laterality: N/A;   FLEXIBLE SIGMOIDOSCOPY N/A 01/18/2016   Procedure: FLEXIBLE SIGMOIDOSCOPY;  Surgeon: Lucilla Lame, MD;  Location: [facility];  Service: Endoscopy;  Laterality: N/A;   HERNIA REPAIR     URETEROSCOPY Right 12/11/2019   Procedure: Christen Butter;  Surgeon: Billey Co, MD;  Location: [facility];  Service: Urology;  Laterality: Right;    Family History  Problem Relation Age of Onset   Hypertension Mother    CAD Sister    Heart attack Sister        Deceased 11-05-14   CAD Brother     Allergies  Allergen Reactions   Latex Other (See Comments)    Pt had allergic reaction to tegaderm dressing on IV.  Caused blisters on the  skin.   Aspirin Rash   Codeine Sulfate Rash   Erythromycin Rash and Other (See Comments)    Fever, also   Prednisone Swelling   Rosiglitazone Maleate Swelling   Tetanus-Diphtheria Toxoids Td Rash and Other (See Comments)    Fever, also    Current Outpatient Medications on File Prior to Visit  Medication Sig Dispense Refill   lidocaine (LIDODERM) 5 % Place 1 patch onto the skin daily. Remove & Discard patch within 12 hours or as directed by MD 15 patch 0   Melatonin 10 MG CAPS Take 1 tablet by mouth at bedtime as needed (sleep).     Insulin Syringe-Needle U-100 25G X 1" 1 ML MISC For 4 times a day insulin SQ, 1 month supply. Diagnosis E11.65 (Patient not taking: Reported on 06/14/2021) 30 each 0   sertraline (ZOLOFT) 50 MG tablet Take 1 tablet (50 mg total) by mouth daily. For anxiety and depression. (Patient not taking: Reported on 06/14/2021) 90 tablet 3   vitamin B-12 (CYANOCOBALAMIN) 1000 MCG tablet Take 1 tablet (1,000 mcg total) by mouth daily. (Patient not taking: Reported on 06/14/2021) 30 tablet 0   No current facility-administered medications on file prior to visit.    BP (!) 148/86   Pulse 87   Temp 97.8 F (36.6 C) (Temporal)   Ht 5' 2"  (1.575 m)   Wt 156 lb (70.8 kg)   SpO2 99%   BMI 28.53 kg/m  Objective:   Physical Exam Cardiovascular:     Rate and Rhythm: Normal rate and regular rhythm.     Comments: Mild bilateral lower extremity edema from mid shin down to ankles.  No pitting or erythema.  No open wounds. Pulmonary:     Effort: Pulmonary effort is normal.     Breath sounds: Normal breath sounds.  Musculoskeletal:     Cervical back: Neck supple.  Skin:    General: Skin is warm and dry.  Neurological:     Mental Status: She is alert.  Psychiatric:        Mood and Affect: Mood normal.          Assessment & Plan:      This visit occurred during the SARS-CoV-2 public health emergency.  Safety protocols were in place, including screening questions  prior to the visit, additional usage of staff PPE, and extensive cleaning of exam room while observing appropriate contact  time as indicated for disinfecting solutions.

## 2021-06-14 NOTE — Assessment & Plan Note (Signed)
Discussed this with patient today.  She is hypertensive during exam, even on repeat blood pressure check.  Refill provided for carvedilol 3.125 mg twice daily.  Discussed slow position changes, proper hydration.

## 2021-06-14 NOTE — Assessment & Plan Note (Signed)
Chronically uncontrolled.  Long history of noncompliance and no-shows to medical appointments.  A1c today of 8.4, out of insulin for the last 4 days.  Will not refill insulin given her frailty, recurrent hypoglycemic episodes, noncompliance.  She cannot take metformin due to CKD. Prescription for glipizide XL 5 mg sent to pharmacy.  She is being dismissed today, discussed this with patient in great detail.  She understands that she will need to receive further refills from her new PCP.  She verbalized understanding. Dismissal letter handed to patient today.

## 2021-06-14 NOTE — Assessment & Plan Note (Signed)
Recommended she follow-up with pain management regarding other options for pain management if she does not wish to proceed with injections.  She has canceled several physical therapy appointments.  We also discussed that I will not refill her hydrocodone.

## 2021-06-14 NOTE — Assessment & Plan Note (Signed)
Repeat BMP pending.  Advised hydration with water, avoidance of NSAID medications.

## 2021-06-14 NOTE — Patient Instructions (Addendum)
Stop by the lab prior to leaving today. I will notify you of your results once received.   Start glipizide XL 5 mg once daily for diabetes. Take this once daily in the morning with food.   Resume your carvedilol 3.25 mg twice daily for blood pressure.   Continue checking your blood sugar levels.  Appropriate times to check your blood sugar levels are:  -Before any meal (breakfast, lunch, dinner) -Two hours after any meal (breakfast, lunch, dinner) -Bedtime  Record your readings and call me if you consistently see readings greater than 150.   You need to call us back with a working phone number so that we can connect you with someone to help you with your medical care.  It was a pleasure to see you today!

## 2021-06-14 NOTE — Progress Notes (Signed)
Patient was provided with dismissal letter in office today. Time was provided for any questions. Patient did not have any questions for me at this time will review further with [name] during appointment.

## 2021-06-15 LAB — CBC
HCT: 28.6 % — ABNORMAL LOW (ref 36.0–46.0)
Hemoglobin: 9.2 g/dL — ABNORMAL LOW (ref 12.0–15.0)
MCHC: 32.1 g/dL (ref 30.0–36.0)
MCV: 79.4 fl (ref 78.0–100.0)
Platelets: 115 10*3/uL — ABNORMAL LOW (ref 150.0–400.0)
RBC: 3.6 Mil/uL — ABNORMAL LOW (ref 3.87–5.11)
RDW: 16.1 % — ABNORMAL HIGH (ref 11.5–15.5)
WBC: 3.5 10*3/uL — ABNORMAL LOW (ref 4.0–10.5)

## 2021-06-15 LAB — COMPREHENSIVE METABOLIC PANEL
ALT: 16 U/L (ref 0–35)
AST: 19 U/L (ref 0–37)
Albumin: 3.9 g/dL (ref 3.5–5.2)
Alkaline Phosphatase: 107 U/L (ref 39–117)
BUN: 23 mg/dL (ref 6–23)
CO2: 24 mEq/L (ref 19–32)
Calcium: 9.2 mg/dL (ref 8.4–10.5)
Chloride: 107 mEq/L (ref 96–112)
Creatinine, Ser: 1.63 mg/dL — ABNORMAL HIGH (ref 0.40–1.20)
GFR: 32.45 mL/min — ABNORMAL LOW (ref >60.00)
Glucose, Bld: 174 mg/dL — ABNORMAL HIGH (ref 70–99)
Potassium: 3.8 mEq/L (ref 3.5–5.1)
Sodium: 139 mEq/L (ref 135–145)
Total Bilirubin: 0.9 mg/dL (ref 0.2–1.2)
Total Protein: 6.6 g/dL (ref 6.0–8.3)

## 2021-06-15 NOTE — Telephone Encounter (Signed)
Patient came in yesterday for appointment and dismissal was discussed and put in to motion.  Patient is aware that we will provide her care for the next 30 days to allow time to connect with other care options.   [name], NP PCP did ask for a valid phone number so we could pass along to our care manager, Quinn Plowman, to follow up with patient and see if she can help her connect with a PACE program or other primary care options for continued care.   Please note that the number patient/husband gave at the appointment was not a valid number and we are unable to do anything further at this time.

## 2021-06-16 IMAGING — DX DG CHEST 1V PORT
1 series · 1 of 1 positions shown · non-contrast
Comparison: 07/17/2019

CLINICAL DATA: Chest pain, hypertension

EXAM:
PORTABLE CHEST 1 VIEW

[chest ap]
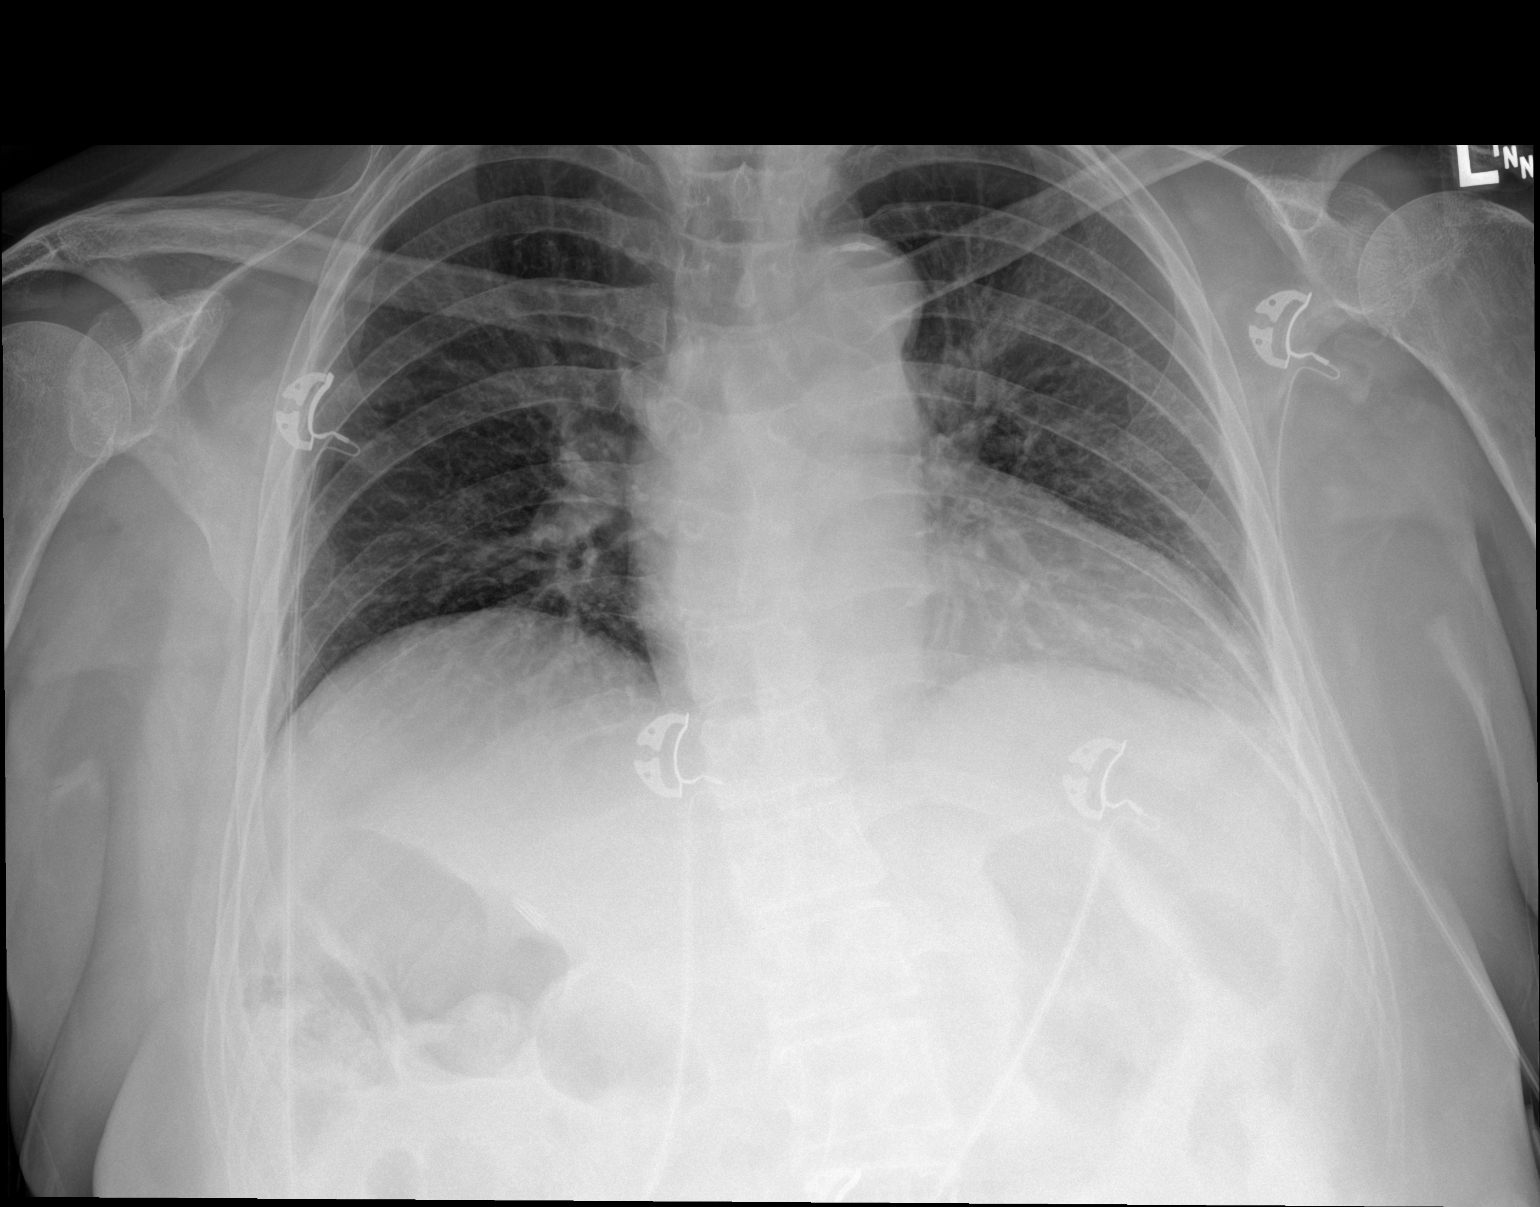

[1 of 1 positions shown; findings below may reference images not displayed]

FINDINGS: The heart size and mediastinal contours are stable. Calcific aortic
knob. Low lung volumes. No focal airspace consolidation, pleural
effusion, or pneumothorax.
IMPRESSION: Low lung volumes.  No acute cardiopulmonary findings.

## 2021-07-01 DIAGNOSIS — E162 Hypoglycemia, unspecified: Secondary | ICD-10-CM | POA: Diagnosis not present

## 2021-07-01 DIAGNOSIS — I4891 Unspecified atrial fibrillation: Secondary | ICD-10-CM | POA: Diagnosis not present

## 2021-07-04 ENCOUNTER — Encounter: Payer: Self-pay | Admitting: Emergency Medicine

## 2021-07-04 ENCOUNTER — Other Ambulatory Visit: Payer: Self-pay

## 2021-07-04 ENCOUNTER — Emergency Department: Payer: Medicare Other

## 2021-07-04 DIAGNOSIS — E039 Hypothyroidism, unspecified: Secondary | ICD-10-CM | POA: Diagnosis not present

## 2021-07-04 DIAGNOSIS — H9201 Otalgia, right ear: Secondary | ICD-10-CM | POA: Insufficient documentation

## 2021-07-04 DIAGNOSIS — Z794 Long term (current) use of insulin: Secondary | ICD-10-CM | POA: Insufficient documentation

## 2021-07-04 DIAGNOSIS — Z8616 Personal history of COVID-19: Secondary | ICD-10-CM | POA: Insufficient documentation

## 2021-07-04 DIAGNOSIS — N1831 Chronic kidney disease, stage 3a: Secondary | ICD-10-CM | POA: Diagnosis not present

## 2021-07-04 DIAGNOSIS — Z87891 Personal history of nicotine dependence: Secondary | ICD-10-CM | POA: Insufficient documentation

## 2021-07-04 DIAGNOSIS — Z7984 Long term (current) use of oral hypoglycemic drugs: Secondary | ICD-10-CM | POA: Diagnosis not present

## 2021-07-04 DIAGNOSIS — I129 Hypertensive chronic kidney disease with stage 1 through stage 4 chronic kidney disease, or unspecified chronic kidney disease: Secondary | ICD-10-CM | POA: Insufficient documentation

## 2021-07-04 DIAGNOSIS — J069 Acute upper respiratory infection, unspecified: Secondary | ICD-10-CM | POA: Diagnosis not present

## 2021-07-04 DIAGNOSIS — Z79899 Other long term (current) drug therapy: Secondary | ICD-10-CM | POA: Insufficient documentation

## 2021-07-04 DIAGNOSIS — R0602 Shortness of breath: Secondary | ICD-10-CM | POA: Diagnosis not present

## 2021-07-04 DIAGNOSIS — I700 Atherosclerosis of aorta: Secondary | ICD-10-CM | POA: Diagnosis not present

## 2021-07-04 DIAGNOSIS — Z8542 Personal history of malignant neoplasm of other parts of uterus: Secondary | ICD-10-CM | POA: Insufficient documentation

## 2021-07-04 DIAGNOSIS — R000 Tachycardia, unspecified: Secondary | ICD-10-CM | POA: Diagnosis not present

## 2021-07-04 DIAGNOSIS — R079 Chest pain, unspecified: Secondary | ICD-10-CM | POA: Diagnosis not present

## 2021-07-04 DIAGNOSIS — Z91040 Latex allergy status: Secondary | ICD-10-CM | POA: Insufficient documentation

## 2021-07-04 DIAGNOSIS — E1122 Type 2 diabetes mellitus with diabetic chronic kidney disease: Secondary | ICD-10-CM | POA: Insufficient documentation

## 2021-07-04 DIAGNOSIS — I2510 Atherosclerotic heart disease of native coronary artery without angina pectoris: Secondary | ICD-10-CM | POA: Insufficient documentation

## 2021-07-04 DIAGNOSIS — Z20822 Contact with and (suspected) exposure to covid-19: Secondary | ICD-10-CM | POA: Insufficient documentation

## 2021-07-04 DIAGNOSIS — R059 Cough, unspecified: Secondary | ICD-10-CM | POA: Diagnosis not present

## 2021-07-04 LAB — COMPREHENSIVE METABOLIC PANEL
ALT: 15 U/L (ref 0–44)
AST: 23 U/L (ref 15–41)
Albumin: 3.5 g/dL (ref 3.5–5.0)
Alkaline Phosphatase: 99 U/L (ref 38–126)
Anion gap: 9 (ref 5–15)
BUN: 21 mg/dL (ref 8–23)
CO2: 21 mmol/L — ABNORMAL LOW (ref 22–32)
Calcium: 9 mg/dL (ref 8.9–10.3)
Chloride: 102 mmol/L (ref 98–111)
Creatinine, Ser: 1.58 mg/dL — ABNORMAL HIGH (ref 0.44–1.00)
GFR, Estimated: 36 mL/min — ABNORMAL LOW (ref >60)
Glucose, Bld: 224 mg/dL — ABNORMAL HIGH (ref 70–99)
Potassium: 3.8 mmol/L (ref 3.5–5.1)
Sodium: 132 mmol/L — ABNORMAL LOW (ref 135–145)
Total Bilirubin: 1 mg/dL (ref 0.3–1.2)
Total Protein: 7.9 g/dL (ref 6.5–8.1)

## 2021-07-04 LAB — CBC WITH DIFFERENTIAL/PLATELET
Abs Immature Granulocytes: 0.03 10*3/uL (ref 0.00–0.07)
Basophils Absolute: 0 10*3/uL (ref 0.0–0.1)
Basophils Relative: 0 %
Eosinophils Absolute: 0 10*3/uL (ref 0.0–0.5)
Eosinophils Relative: 0 %
HCT: 35.6 % — ABNORMAL LOW (ref 36.0–46.0)
Hemoglobin: 11.8 g/dL — ABNORMAL LOW (ref 12.0–15.0)
Immature Granulocytes: 0 %
Lymphocytes Relative: 12 %
Lymphs Abs: 1.1 10*3/uL (ref 0.7–4.0)
MCH: 27.1 pg (ref 26.0–34.0)
MCHC: 33.1 g/dL (ref 30.0–36.0)
MCV: 81.7 fL (ref 80.0–100.0)
Monocytes Absolute: 0.5 10*3/uL (ref 0.1–1.0)
Monocytes Relative: 6 %
Neutro Abs: 8 10*3/uL — ABNORMAL HIGH (ref 1.7–7.7)
Neutrophils Relative %: 82 %
Platelets: 246 10*3/uL (ref 150–400)
RBC: 4.36 MIL/uL (ref 3.87–5.11)
RDW: 16.6 % — ABNORMAL HIGH (ref 11.5–15.5)
WBC: 9.7 10*3/uL (ref 4.0–10.5)
nRBC: 0 % (ref 0.0–0.2)

## 2021-07-04 LAB — TROPONIN I (HIGH SENSITIVITY)
Troponin I (High Sensitivity): 4 ng/L (ref <18)
Troponin I (High Sensitivity): 6 ng/L (ref <18)

## 2021-07-04 LAB — LIPASE, BLOOD: Lipase: 25 U/L (ref 11–51)

## 2021-07-04 LAB — LACTIC ACID, PLASMA
Lactic Acid, Venous: 1.8 mmol/L (ref 0.5–1.9)
Lactic Acid, Venous: 1.7 mmol/L (ref 0.5–1.9)

## 2021-07-04 NOTE — ED Triage Notes (Signed)
Pt to ED from home c/o SOB, chest pain, productive cough, vomiting, and decreased oral intake x1 week.  Pt states also right ear pain, placed a tissue in here ear and noticed blood on it.  Pt states difficulty with urination and pushing hard, states burning with urination.

## 2021-07-05 ENCOUNTER — Emergency Department
Admission: EM | Admit: 2021-07-05 | Discharge: 2021-07-05 | Disposition: A | Discharge status: Home / Self Care | Payer: Medicare Other | Attending: Emergency Medicine | Admitting: Emergency Medicine

## 2021-07-05 DIAGNOSIS — J069 Acute upper respiratory infection, unspecified: Secondary | ICD-10-CM

## 2021-07-05 DIAGNOSIS — H9201 Otalgia, right ear: Secondary | ICD-10-CM

## 2021-07-05 LAB — BLOOD CULTURE ID PANEL (REFLEXED) - BCID2

## 2021-07-05 LAB — RESP PANEL BY RT-PCR (FLU A&B, COVID) ARPGX2
Influenza A by PCR: NEGATIVE
Influenza B by PCR: NEGATIVE
SARS Coronavirus 2 by RT PCR: NEGATIVE

## 2021-07-05 MED ORDER — HYDROCOD POLST-CPM POLST ER 10-8 MG/5ML PO SUER: 5.0000 mL | Freq: Two times a day (BID) | ORAL | 0 refills | Status: AC | PRN

## 2021-07-05 MED ORDER — CIPROFLOXACIN-DEXAMETHASONE 0.3-0.1 % OT SUSP: 4.0000 [drp] | Freq: Two times a day (BID) | OTIC | 0 refills | Status: AC

## 2021-07-05 MED ORDER — AMOXICILLIN-POT CLAVULANATE 875-125 MG PO TABS: 1.0000 | ORAL_TABLET | Freq: Two times a day (BID) | ORAL | 0 refills | Status: AC

## 2021-07-05 NOTE — ED Notes (Signed)
ERMD at bedside for this pt

## 2021-07-05 NOTE — Discharge Instructions (Addendum)
As we discussed, your symptoms seem to be the result of a viral infection.  However given the possibility of infection in your right ear, we provided you with 2 different prescriptions, 1 for eardrops and 1 for an oral antibiotic.  We also provided you with a prescription for cough medicine.  Please follow-up with your primary care doctor at the next billable opportunity.  Please also consider scheduling a follow-up appointment with Dr. Tami Ribas or another ENT specialist regarding your ear.  Please try to avoid getting water or anything else in your right ear other than the prescribed drops.  Even if you are not feeling well, it is important that you drink plenty of fluids to try and stay hydrated.  Continue taking your regular medications.    Return to the emergency department if you develop new or worsening symptoms that concern you.

## 2021-07-05 NOTE — ED Provider Notes (Signed)
[facility] Emergency Department Provider Note  ____________________________________________   Event Date/Time   First MD Initiated Contact with Patient 07/05/21 [phone]     (approximate)  I have reviewed the triage vital signs and the nursing notes.   HISTORY  Chief Complaint Chest Pain, Shortness of Breath, and Nasal Congestion    HPI [name] is a 65 y.o. female with medical issues as listed below who presents for evaluation of about 6 days of symptoms that include general malaise, fatigue, right ear pain, sore throat, and cough.  She has also had some body aches.  Nothing in particular makes her feel better or worse.  She said that she has been nauseated and not had an appetite and that she has not had anything to eat or drink for 6 days.  Occasionally she coughs so hard that she vomits.  Nothing in particular makes the symptoms better and exertion makes it worse.  She reports that tonight the ear pain was worse on the right and then she had some discharge that appeared to look like blood from the right ear.  No recent trauma.     Past Medical History:  Diagnosis Date   Acute encephalopathy 05/22/2018   Allergy    Anginal pain (HCC)    Anxiety    Ascites    C. difficile colitis 07/10/2015   Cancer ([place])    HX OF CANCER OF UTERUS    Chronic kidney disease    States she only has 1 working kidney   Cirrhosis of liver not due to alcohol ([place]) 2016   Degenerative disk disease    Diverticulitis    Gastroparesis    GERD (gastroesophageal reflux disease)    History of hiatal hernia    Hypertension    Hypothyroid    Hypothyroidism due to amiodarone    Ileus ([place]) 08/01/2015   Intussusception intestine ([place]) 05/2015   Orthostatic hypotension    PAF (paroxysmal atrial fibrillation) ([place]) 03/2015   a. new onset 03/2015 in setting of intractable N/V; b. on Eliquis 5 mg bid; c. CHADSVASc 4 (DM, TIA x 2, female)   Pancreatitis    Pneumonia  11/14/2015   Pneumonia 07/2020   Right ureteral stone 07/14/2016   Sepsis ([place]) 12/15/2017   Sick sinus syndrome ([place])    Stomach ulcer    Stroke ([place])    with minimal left sided weakness   Syncope 01/2015   Syncope due to orthostatic hypotension 05/18/2015   Tachyarrhythmia 01/10/2016   TIA (transient ischemic attack) 02/2015   Type 1 diabetes ([place])    on levemir   UTERINE CANCER, HX OF 03/27/2007   Qualifier: Diagnosis of  By: Maxie Better FNP, Rosalita Levan    UTI (urinary tract infection) 05/22/2018    Patient Active Problem List   Diagnosis Date Noted   Acute cystitis with hematuria    Right-sided chest wall pain    Hypothermia 03/04/2021   Hypoglycemia associated with diabetes ([place]) 03/04/2021   Encephalopathy 02/11/2021   Cervicalgia 01/11/2021   Cervical facet syndrome 01/11/2021   Cervical facet arthropathy 01/11/2021   Lumbosacral facet arthropathy 01/11/2021   Recurrent falls while walking 01/11/2021   Chronic low back pain (1ry area of Pain) (Bilateral) w/o sciatica 01/11/2021   Acute encephalopathy 12/13/2020   History of substance use disorder 12/12/2020   Substance use disorder 12/12/2020   Hematuria 10/27/2020   Hemoptysis 10/27/2020   Weakness 09/28/2020   COVID-19 virus infection    Stroke ([place])  09/27/2020   Hypoglycemia 08/27/2020   CKD (chronic kidney disease), stage III ([place]) 08/27/2020   Shortness of breath 07/16/2020   Candidal intertrigo 07/16/2020   Sepsis ([place]) 11/23/2019   Acute pyelonephritis 09/30/2019   Acute pain of left foot 08/18/2019   Type II diabetes mellitus with renal manifestations ([place]) 08/13/2019   Weakness of both lower extremities 07/31/2019   Altered mental status 07/14/2019   GAD (generalized anxiety disorder) 06/11/2019   Poor memory 06/11/2019   Constipation 05/25/2019   Acute medial meniscus tear of left knee 05/25/2019   AKI (acute kidney injury) ([place]) 05/16/2019   Closed fracture of left distal fibula 05/16/2019   Sinus  tachycardia 05/05/2019   Congestive dilated cardiomyopathy ([place]) 04/14/2019   CAD (coronary artery disease) 04/10/2019   Closed fracture of lateral malleolus 04/10/2019   Lumbar facet syndrome (Bilateral) (R>L) 03/09/2019   Long term (current) use of anticoagulants 03/09/2019   Unspecified cirrhosis of liver ([place]) 02/11/2019   Hypoalbuminemia 02/11/2019   Edema due to hypoalbuminemia 02/11/2019   Lower extremity edema 01/26/2019   Prolonged QT interval 12/17/2018   Hypomagnesemia 12/17/2018   Type 2 diabetes mellitus with hyperglycemia ([place]) 12/17/2018   H/O TIA (transient ischemic attack) and stroke 12/17/2018   Personal history of surgery to heart and great vessels, presenting hazards to health 12/17/2018   DDD (degenerative disc disease), lumbar 12/17/2018   Lumbar intervertebral disc displacement 12/17/2018   Chronic musculoskeletal pain 12/17/2018   Chronic generalized pain 12/17/2018   Hyponatremia 12/17/2018   Fibromyalgia syndrome 12/17/2018   Other intervertebral disc displacement, lumbar region 12/17/2018   Vitamin D deficiency 11/26/2018   Elevated C-reactive protein (CRP) 11/26/2018   Elevated sed rate 11/26/2018   Chronic low back pain (1ry area of Pain) (Bilateral) (L>R) w/ sciatica (Bilateral) 11/25/2018   Chronic lower extremity pain (2ry area of Pain) (Bilateral) (L>R) 11/25/2018   Chronic neck pain 11/25/2018   Pharmacologic therapy 11/25/2018   Disorder of skeletal system 11/25/2018   Problems influencing health status 11/25/2018   Renal mass 10/06/2018   Chronic intermittent abdominal pain 09/25/2018   Unspecified abdominal pain 09/25/2018   Frequent falls 09/12/2018   Orthostatic hypotension 08/18/2018   Normochromic normocytic anemia 08/18/2018   Acute renal failure superimposed on stage 3a chronic kidney disease ([place]) 08/18/2018   History of hypothyroidism 08/14/2018   Medical non-compliance 08/14/2018   Diabetic ketoacidosis ([place]) 05/22/2018   Acute  lower UTI 05/22/2018   Diarrhea    NSTEMI (non-ST elevated myocardial infarction) ([place]) 01/11/2016   Tachy-brady syndrome ([place]) 12/28/2015   PAF (paroxysmal atrial fibrillation) ([place]) 11/24/2015   Type 2 diabetes mellitus with neurologic complication ([place]) 94/17/4081   S/P cholecystectomy 11/23/2015   Narcotic abuse ([place]) 11/22/2015   Chronic superficial gastritis without bleeding 11/22/2015   History of Clostridium difficile 11/22/2015   History of TIA (transient ischemic attack) 11/22/2015   Mixed hyperlipidemia 11/22/2015   Diverticulosis 11/22/2015   History of uterine cancer 11/22/2015   Hypothyroidism, unspecified 11/22/2015   Intractable cyclical vomiting with nausea 11/22/2015   Community acquired pneumonia of left lower lobe of lung 11/14/2015   Narcotic withdrawal ([place]) 11/11/2015   Hyperlipidemia 08/31/2015   Hypotension 08/06/2015   Cryptogenic cirrhosis ([place]) 07/10/2015   Atrial fibrillation ([place]) 07/10/2015   Malnutrition of moderate degree 07/04/2015   Symptomatic bradycardia 05/27/2015   Chronic anemia 05/19/2015   Thrombocytopenia ([place]) 05/19/2015   Hypokalemia 04/06/2015   Hyperlipidemia with target LDL less than 100 04/06/2015   Syncope 03/11/2015  CVA (cerebral vascular accident) ([place]) 02/15/2015   Essential hypertension 01/12/2015   Chronically on opiate therapy 01/12/2015   Cirrhosis of liver not due to alcohol ([place]) 2016   S/P Nissen fundoplication (without gastrostomy tube) procedure 05/11/2014   Dysphagia 04/19/2014   Myofascial pain syndrome 06/27/2007   Chronic pain syndrome 03/28/2007   GERD 03/27/2007   Diverticulosis of large intestine without perforation or abscess without bleeding 03/27/2007   Proteinuria 03/27/2007    Past Surgical History:  Procedure Laterality Date   ABDOMINAL HYSTERECTOMY     CARDIAC CATHETERIZATION N/A 01/12/2016   Procedure: Left Heart Cath and Coronary Angiography;  Surgeon: Wellington Hampshire, MD;  Location: [facility];  Service: Cardiovascular;  Laterality: N/A;   CHOLECYSTECTOMY     CYSTOSCOPY W/ RETROGRADES Bilateral 12/11/2019   Procedure: CYSTOSCOPY WITH RETROGRADE PYELOGRAM;  Surgeon: Billey Co, MD;  Location: [facility];  Service: Urology;  Laterality: Bilateral;   CYSTOSCOPY WITH BIOPSY N/A 12/11/2019   Procedure: CYSTOSCOPY WITH BLADDER BIOPSY;  Surgeon: Billey Co, MD;  Location: [facility];  Service: Urology;  Laterality: N/A;   CYSTOSCOPY WITH FULGERATION N/A 12/11/2019   Procedure: CYSTOSCOPY WITH FULGERATION;  Surgeon: Billey Co, MD;  Location: [facility];  Service: Urology;  Laterality: N/A;   CYSTOSCOPY WITH STENT PLACEMENT Right 12/11/2019   Procedure: CYSTOSCOPY WITH STENT PLACEMENT;  Surgeon: Billey Co, MD;  Location: [facility];  Service: Urology;  Laterality: Right;   CYSTOSCOPY WITH URETHRAL DILATATION Right 12/11/2019   Procedure: CYSTOSCOPY WITH URETERAL DILATATION;  Surgeon: Billey Co, MD;  Location: [facility];  Service: Urology;  Laterality: Right;   CYSTOSCOPY/URETEROSCOPY/HOLMIUM LASER Right 07/14/2016   Procedure: CYSTOSCOPY/URETEROSCOPY/HOLMIUM LASER;  Surgeon: Alexis Frock, MD;  Location: [facility];  Service: Urology;  Laterality: Right;   ESOPHAGOGASTRODUODENOSCOPY N/A 04/04/2015   Procedure: ESOPHAGOGASTRODUODENOSCOPY (EGD);  Surgeon: Hulen Luster, MD;  [facility];  Service: Endoscopy;  Laterality: N/A;   ESOPHAGOGASTRODUODENOSCOPY N/A 12/28/2017   Procedure: ESOPHAGOGASTRODUODENOSCOPY (EGD);  Surgeon: Lin Landsman, MD;  Location: [facility] ENDOSCOPY;  Service: Gastroenterology;  Laterality: N/A;   ESOPHAGOGASTRODUODENOSCOPY (EGD) WITH PROPOFOL N/A 01/18/2016   Procedure: ESOPHAGOGASTRODUODENOSCOPY (EGD) WITH PROPOFOL;  Surgeon: Lucilla Lame, MD;  Location: [facility] ENDOSCOPY;  Service: Endoscopy;  Laterality: N/A;   FLEXIBLE SIGMOIDOSCOPY N/A 01/18/2016   Procedure: FLEXIBLE SIGMOIDOSCOPY;  Surgeon: Lucilla Lame, MD;  Location: [facility] ENDOSCOPY;   Service: Endoscopy;  Laterality: N/A;   HERNIA REPAIR     URETEROSCOPY Right 12/11/2019   Procedure: Christen Butter;  Surgeon: Billey Co, MD;  Location: [facility];  Service: Urology;  Laterality: Right;    Prior to Admission medications   Medication Sig Start Date End Date Taking? Authorizing Provider  amoxicillin-clavulanate (AUGMENTIN) 875-125 MG tablet Take 1 tablet by mouth every 12 (twelve) hours for 10 days. 07/05/21 07/15/21 Yes Hinda Kehr, MD  chlorpheniramine-HYDROcodone [facility] PENNKINETIC ER) 10-8 MG/5ML SUER Take 5 mLs by mouth every 12 (twelve) hours as needed for cough. 07/05/21  Yes Hinda Kehr, MD  ciprofloxacin-dexamethasone (CIPRODEX) OTIC suspension Place 4 drops into the right ear 2 (two) times daily for 7 days. 07/05/21 07/12/21 Yes Hinda Kehr, MD  carvedilol (COREG) 3.125 MG tablet Take 1 tablet (3.125 mg total) by mouth 2 (two) times daily with a meal. For blood pressure. 06/14/21   [name], NP  glipiZIDE (GLIPIZIDE XL) 5 MG 24 hr tablet Take 1 tablet (5 mg total) by mouth daily with breakfast. For diabetes. 06/14/21   Alma Friendly  K, NP  Insulin Syringe-Needle U-100 25G X 1" 1 ML MISC For 4 times a day insulin SQ, 1 month supply. Diagnosis E11.65 Patient not taking: Reported on 06/14/2021 02/15/21   Thurnell Lose, MD  lidocaine (LIDODERM) 5 % Place 1 patch onto the skin daily. Remove & Discard patch within 12 hours or as directed by MD 03/08/21   Eugenie Filler, MD  Melatonin 10 MG CAPS Take 1 tablet by mouth at bedtime as needed (sleep).    [provider]  sertraline (ZOLOFT) 50 MG tablet Take 1 tablet (50 mg total) by mouth daily. For anxiety and depression. Patient not taking: Reported on 06/14/2021 10/27/20   [name], NP  vitamin B-12 (CYANOCOBALAMIN) 1000 MCG tablet Take 1 tablet (1,000 mcg total) by mouth daily. Patient not taking: Reported on 06/14/2021 02/16/21   Thurnell Lose, MD    Allergies Latex,  Aspirin, Codeine sulfate, Erythromycin, Prednisone, Rosiglitazone maleate, and Tetanus-diphtheria toxoids td  Family History  Problem Relation Age of Onset   Hypertension Mother    CAD Sister    Heart attack Sister        Deceased Nov 24, 2014   CAD Brother     Social History Social History   Tobacco Use   Smoking status: Former    Types: Cigarettes   Smokeless tobacco: Never   Tobacco comments:    25 years ago and only smoked occasionally  Vaping Use   Vaping Use: Never used  Substance Use Topics   Alcohol use: No   Drug use: No    Review of Systems Constitutional: Chills, general malaise, fatigue. Eyes: No visual changes. ENT: Positive for sore throat and right ear pain with some bloody discharge. Cardiovascular: Chest pain when she coughs. Respiratory: Frequent cough sometimes leading to posttussive emesis, some shortness of breath. Gastrointestinal: No abdominal pain.  Decreased oral intake for about a week.  Nausea, some posttussive emesis. Genitourinary: Negative for dysuria. Musculoskeletal: Generalized body aches.  Negative for neck pain.  Negative for back pain. Integumentary: Negative for rash. Neurological: Negative for headaches, focal weakness or numbness.   ____________________________________________   PHYSICAL EXAM:  VITAL SIGNS: ED Triage Vitals  Enc Vitals Group     BP 07/04/21 1900 95/68     Pulse Rate 07/04/21 1900 (!) 118     Resp 07/04/21 1900 (!) 21     Temp 07/04/21 1900 98.4 F (36.9 C)     Temp Source 07/04/21 1900 Oral     SpO2 07/04/21 1900 97 %     Weight 07/04/21 1906 63.5 kg (140 lb)     Height 07/04/21 1906 1.6 m (5' 3" )     Head Circumference --      Peak Flow --      Pain Score 07/04/21 1906 8     Pain Loc --      Pain Edu? --      Excl. in [place]? --     Constitutional: Alert and oriented.  Eyes: Conjunctivae are normal.  Head: Atraumatic. Ears: Difficult to see in her ear nose but with some evidence of dried blood in the  ear.  Cannot directly visualize tympanic membrane. Nose: +congestion/rhinnorhea. Mouth/Throat: Patient is wearing a mask. Neck: No stridor.  No meningeal signs.   Cardiovascular: Normal rate, regular rhythm. Good peripheral circulation. Respiratory: Normal respiratory effort.  No retractions.  Frequent cough but breath sounds are normal and clear otherwise. Gastrointestinal: Soft and nontender. No distention.  Musculoskeletal: No lower extremity  tenderness nor edema. No gross deformities of extremities. Neurologic:  Normal speech and language. No gross focal neurologic deficits are appreciated.  Skin:  Skin is warm, dry and intact. Psychiatric: Mood and affect are depressed but generally appropriate under the circumstances.  ____________________________________________   LABS (all labs ordered are listed, but only abnormal results are displayed)  Labs Reviewed  COMPREHENSIVE METABOLIC PANEL - Abnormal; Notable for the following components:      Result Value   Sodium 132 (*)    CO2 21 (*)    Glucose, Bld 224 (*)    Creatinine, Ser 1.58 (*)    GFR, Estimated 36 (*)    All other components within normal limits  CBC WITH DIFFERENTIAL/PLATELET - Abnormal; Notable for the following components:   Hemoglobin 11.8 (*)    HCT 35.6 (*)    RDW 16.6 (*)    Neutro Abs 8.0 (*)    All other components within normal limits  RESP PANEL BY RT-PCR (FLU A&B, COVID) ARPGX2  CULTURE, BLOOD (SINGLE)  LACTIC ACID, PLASMA  LACTIC ACID, PLASMA  LIPASE, BLOOD  URINALYSIS, ROUTINE W REFLEX MICROSCOPIC  TROPONIN I (HIGH SENSITIVITY)  TROPONIN I (HIGH SENSITIVITY)   ____________________________________________  EKG  ED ECG REPORT I, Hinda Kehr, the attending physician, personally viewed and interpreted this ECG.  Date: 07/04/2021 EKG Time: 19:07 Rate: 117 Rhythm: sinus tachycardia QRS Axis: normal Intervals: normal ST/T Wave abnormalities: Non-specific ST segment / T-wave changes, but no  clear evidence of acute ischemia. Narrative Interpretation: no definitive evidence of acute ischemia; does not meet STEMI criteria.  ____________________________________________  RADIOLOGY I, Hinda Kehr, personally viewed and evaluated these images (plain radiographs) as part of my medical decision making, as well as reviewing the written report by the radiologist.  ED MD interpretation: No acute abnormalities.  Official radiology report(s): DG Chest 2 View  Result Date: 07/04/2021 CLINICAL DATA:  Shortness of breath with chest pain and cough. EXAM: CHEST - 2 VIEW COMPARISON:  March 04, 2021 FINDINGS: The heart size and mediastinal contours are within normal limits. There is mild calcification of the aortic arch. Both lungs are clear. Radiopaque surgical clips are seen within the right upper quadrant. The visualized skeletal structures are unremarkable. IMPRESSION: No active cardiopulmonary disease. Electronically Signed   By: Virgina Norfolk M.D.   On: 07/04/2021 19:39    ____________________________________________   PROCEDURES   Procedure(s) performed (including Critical Care):  Procedures   ____________________________________________   INITIAL IMPRESSION / MDM / [place] / ED COURSE  As part of my medical decision making, I reviewed the following data within the [place] notes reviewed and incorporated, Labs reviewed , EKG interpreted , Old chart reviewed, Radiograph reviewed , and Notes from prior ED visits   Differential diagnosis includes, but is not limited to, viral illness, pneumonia, electrolyte or metabolic abnormality.  Patient's vital signs are stable and within normal limits.  No hypoxemia, no tachycardia, mild hypertension rather than hypotension.  She has a frequent cough but I personally reviewed the patient's imaging and agree with the radiologist's interpretation that there are no acute abnormalities including no  evidence of pneumonia.  She has some mild hyponatremia but otherwise a stable metabolic panel including an elevated creatinine at baseline.  CBC is normal with no leukocytosis.  Lipase is normal.  No evidence of ischemia on EKG, and high-sensitivity troponin is within normal limits x2.  Lactic acid is normal.  Respiratory viral panel is normal.  I  have dated the patient about the reassuring results.  I will provide prescriptions as listed below including eardrops.  Recommended close outpatient follow-up.  Patient says she understands and agrees.  I went over with her how important it is that she try to stay hydrated; even though she does not appear dehydrated at this time she is at risk of doing so if she does not have anything to eat or drink.  She said that she understands.  I gave my usual and customary return precautions.           ____________________________________________  FINAL CLINICAL IMPRESSION(S) / ED DIAGNOSES  Final diagnoses:  Viral URI with cough  Acute otalgia, right     MEDICATIONS GIVEN DURING THIS VISIT:  Medications - No data to display   ED Discharge Orders          Ordered    amoxicillin-clavulanate (AUGMENTIN) 875-125 MG tablet  Every 12 hours        07/05/21 0552    ciprofloxacin-dexamethasone (CIPRODEX) OTIC suspension  2 times daily        07/05/21 0552    chlorpheniramine-HYDROcodone (TUSSIONEX PENNKINETIC ER) 10-8 MG/5ML SUER  Every 12 hours PRN        07/05/21 1224             Note:  This document was prepared using Dragon voice recognition software and may include unintentional dictation errors.   Hinda Kehr, MD 07/05/21 [phone]

## 2021-07-05 NOTE — ED Notes (Signed)
Pt in triage area waiting for requested transport (free cab ride)  with her husband at triage .

## 2021-07-06 IMAGING — CT CT CERVICAL SPINE W/O CM
3 of 4 series · 12 of 33 positions shown, 14 images · non-contrast
Comparison: August 18, 2018

CLINICAL DATA: Fall

EXAM:
CT CERVICAL SPINE WITHOUT CONTRAST
TECHNIQUE: Multidetector CT imaging of the cervical spine was performed without
intravenous contrast. Multiplanar CT image reconstructions were also
generated.

[Series 5: sagittal bone · sagittal · 0.23mm/px · 5 of 47 slices shown, 6 images]
[im 16/47  bone]
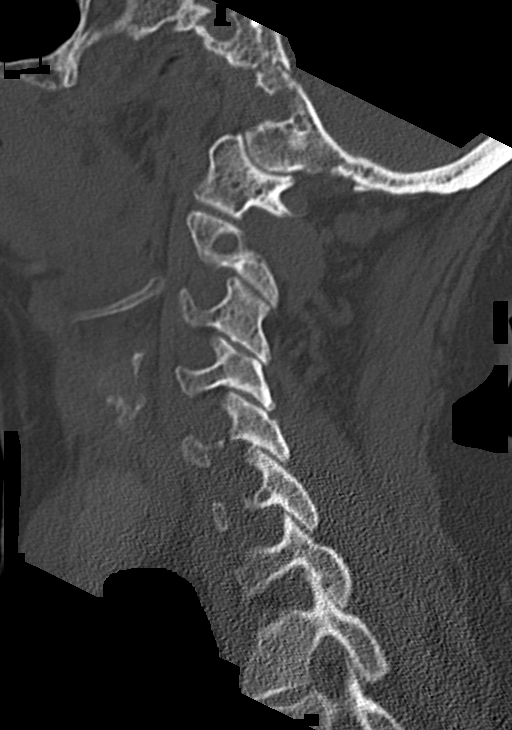
[im 20/47  bone]
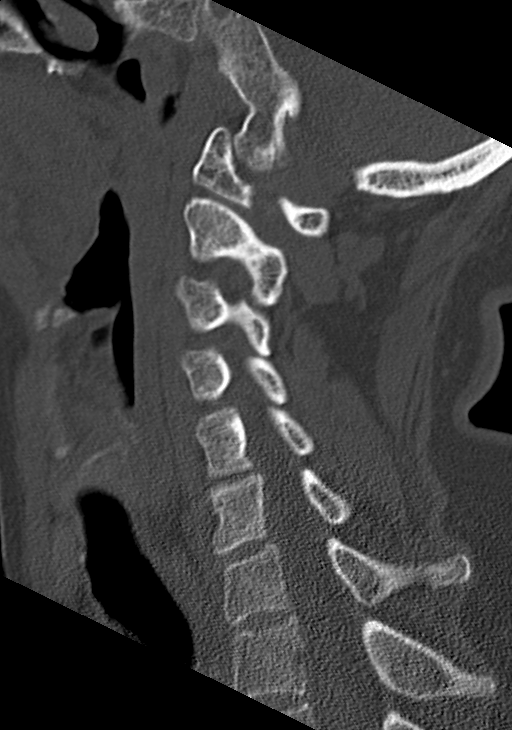
[im 24/47  soft-tissue]
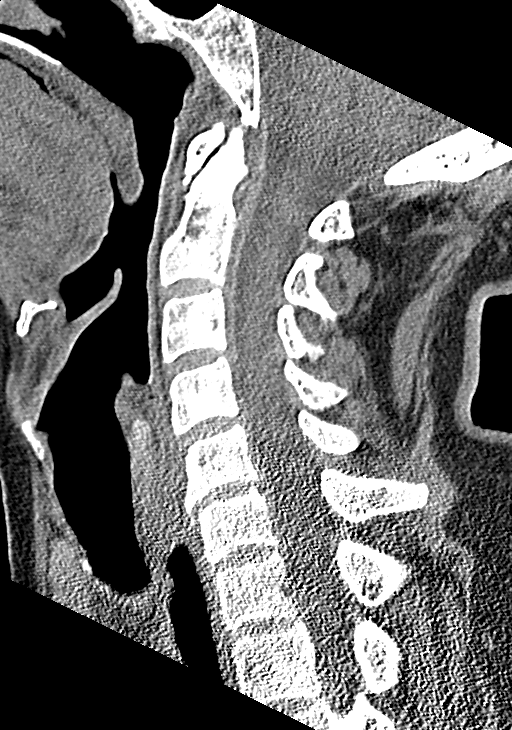
[im 24/47  bone]
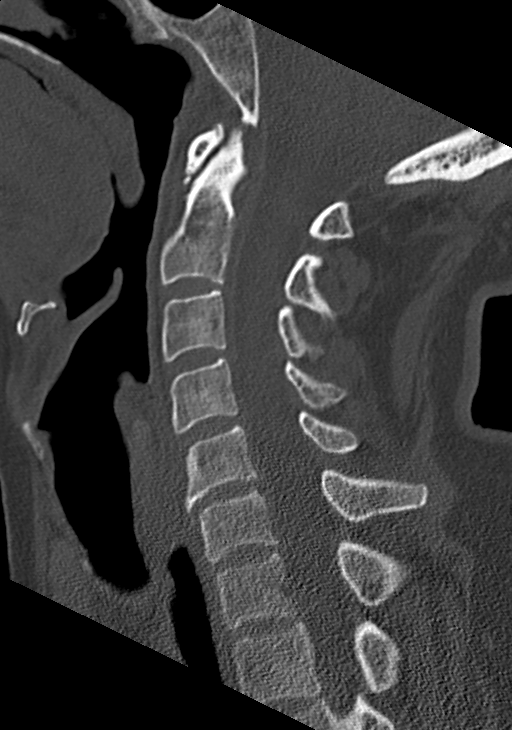
[im 27/47  bone]
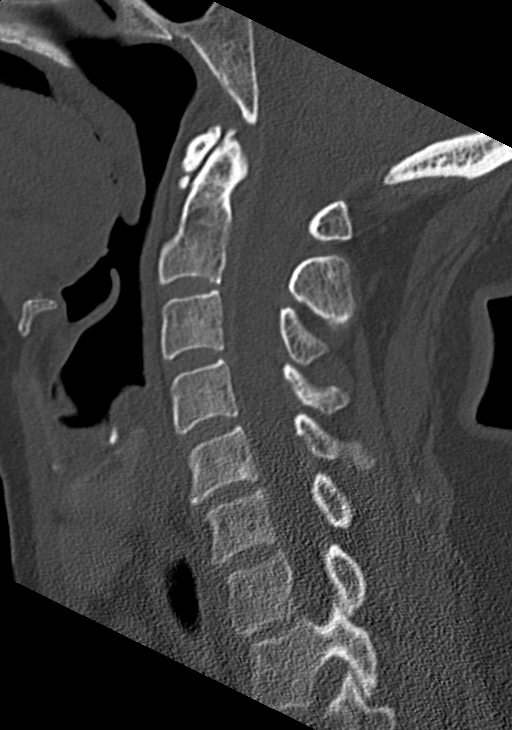
[im 31/47  bone]
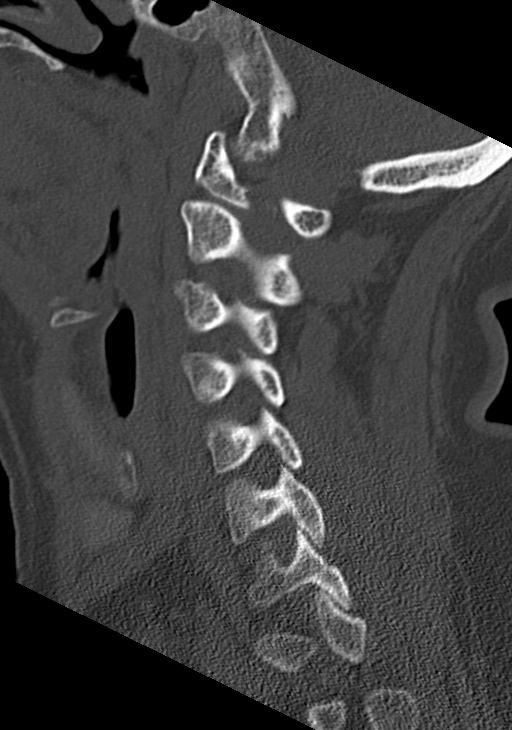

[Series 6: orthogonal axials · axial · 0.18mm/px · z∈[+379,+487]mm · 4 of 86 slices shown, 5 images]
[im 13/86  soft-tissue]
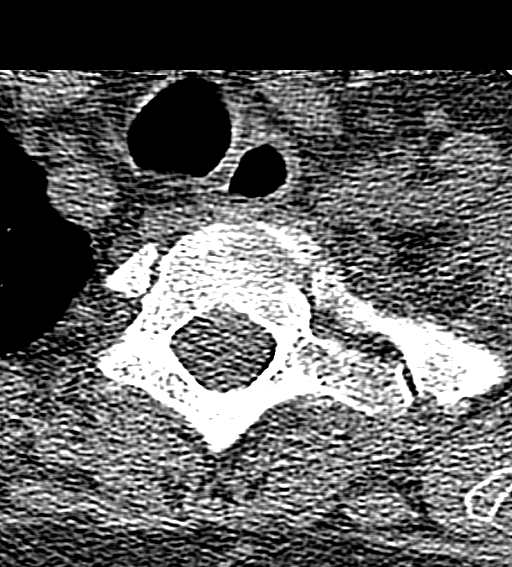
[im 13/86  bone]
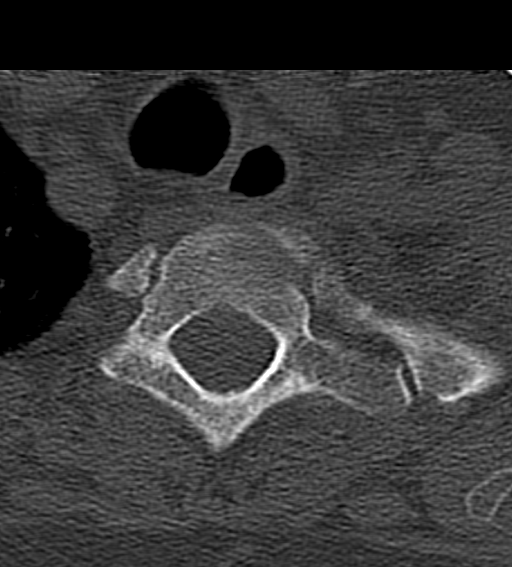
[im 37/86  bone]
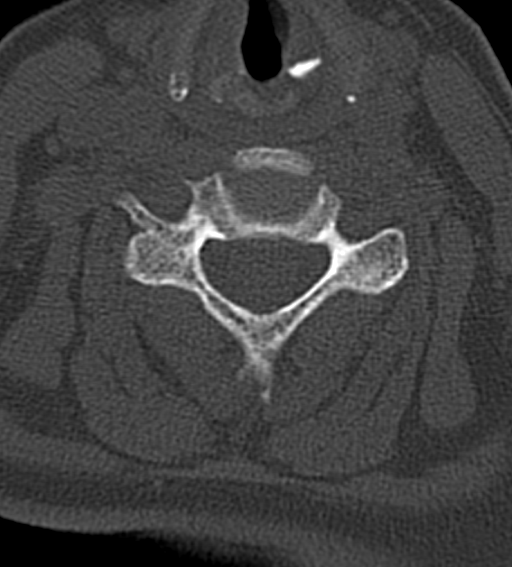
[im 49/86  bone]
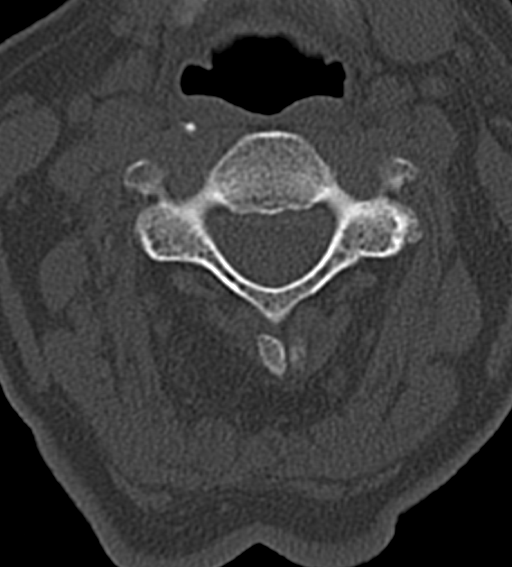
[im 73/86  bone]
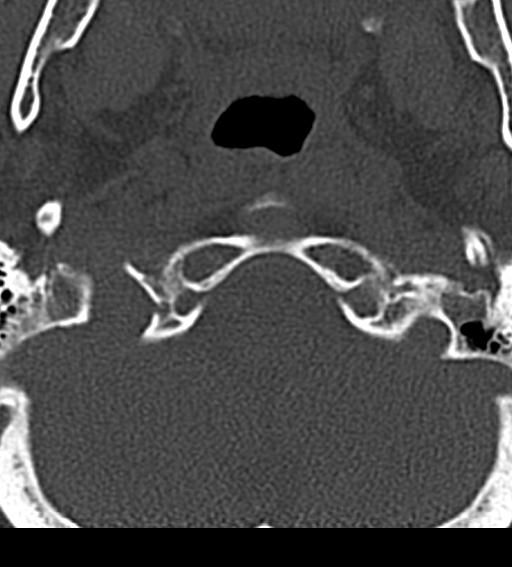

[Series 7: coronal bone · coronal · 0.18mm/px · 3 of 61 slices shown]
[im 14/61  bone]
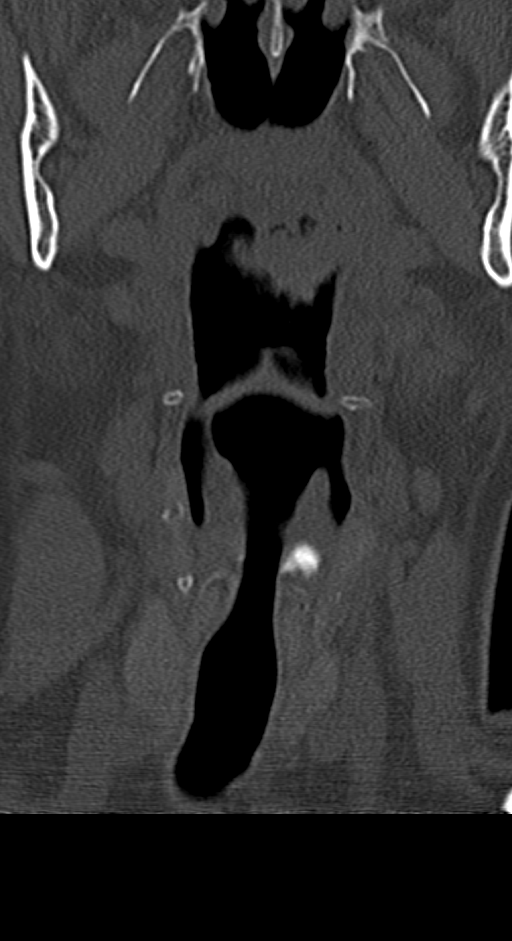
[im 25/61  bone]
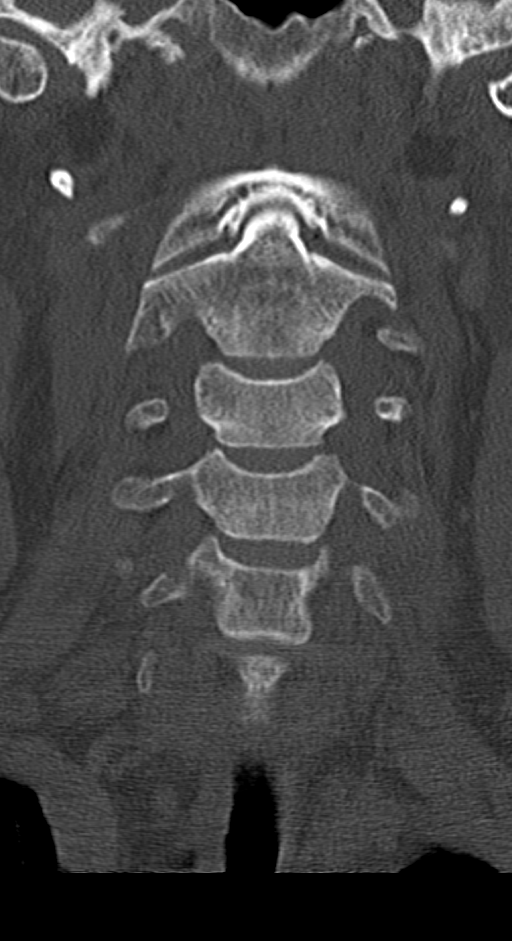
[im 36/61  bone]
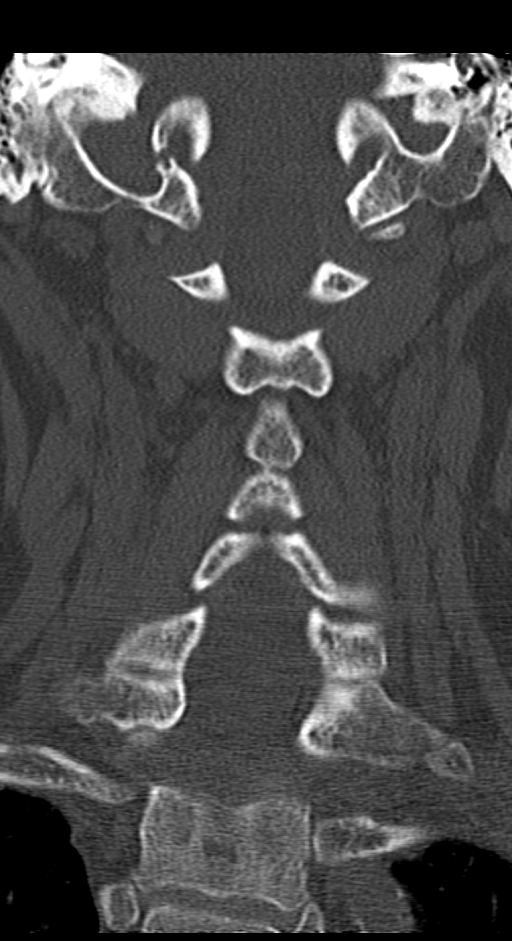

[12 of 33 positions shown; findings below may reference images not displayed]

FINDINGS: Alignment: Anteroposterior alignment is maintained.

Skull base and vertebrae: There is no acute fracture. Vertebral body
heights are preserved.

Soft tissues and spinal canal: No prevertebral fluid or swelling. No
visible canal hematoma.

Disc levels: Mild degenerative changes are present. There is no
significant stenosis.

Upper chest: Negative.

Other: None.
IMPRESSION: No acute cervical spine fracture.

## 2021-07-06 IMAGING — CR DG LUMBAR SPINE 2-3V
3 series · 3 of 3 positions shown · non-contrast
Comparison: Radiographs dated 12/16/2014

CLINICAL DATA: Low back pain secondary to multiple recent falls
including today.

EXAM:
LUMBAR SPINE - 2-3 VIEW

[l-spine ap]
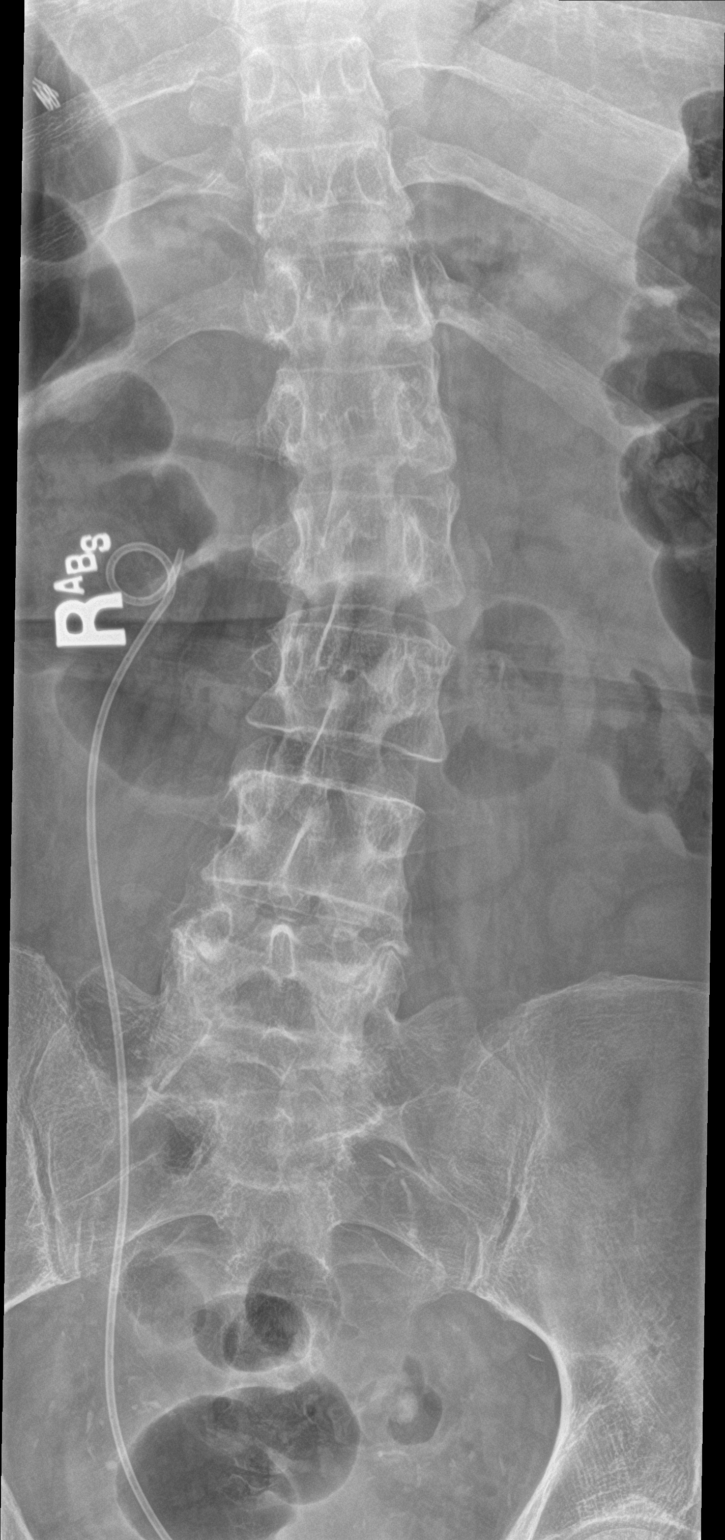

[l-spine lat]
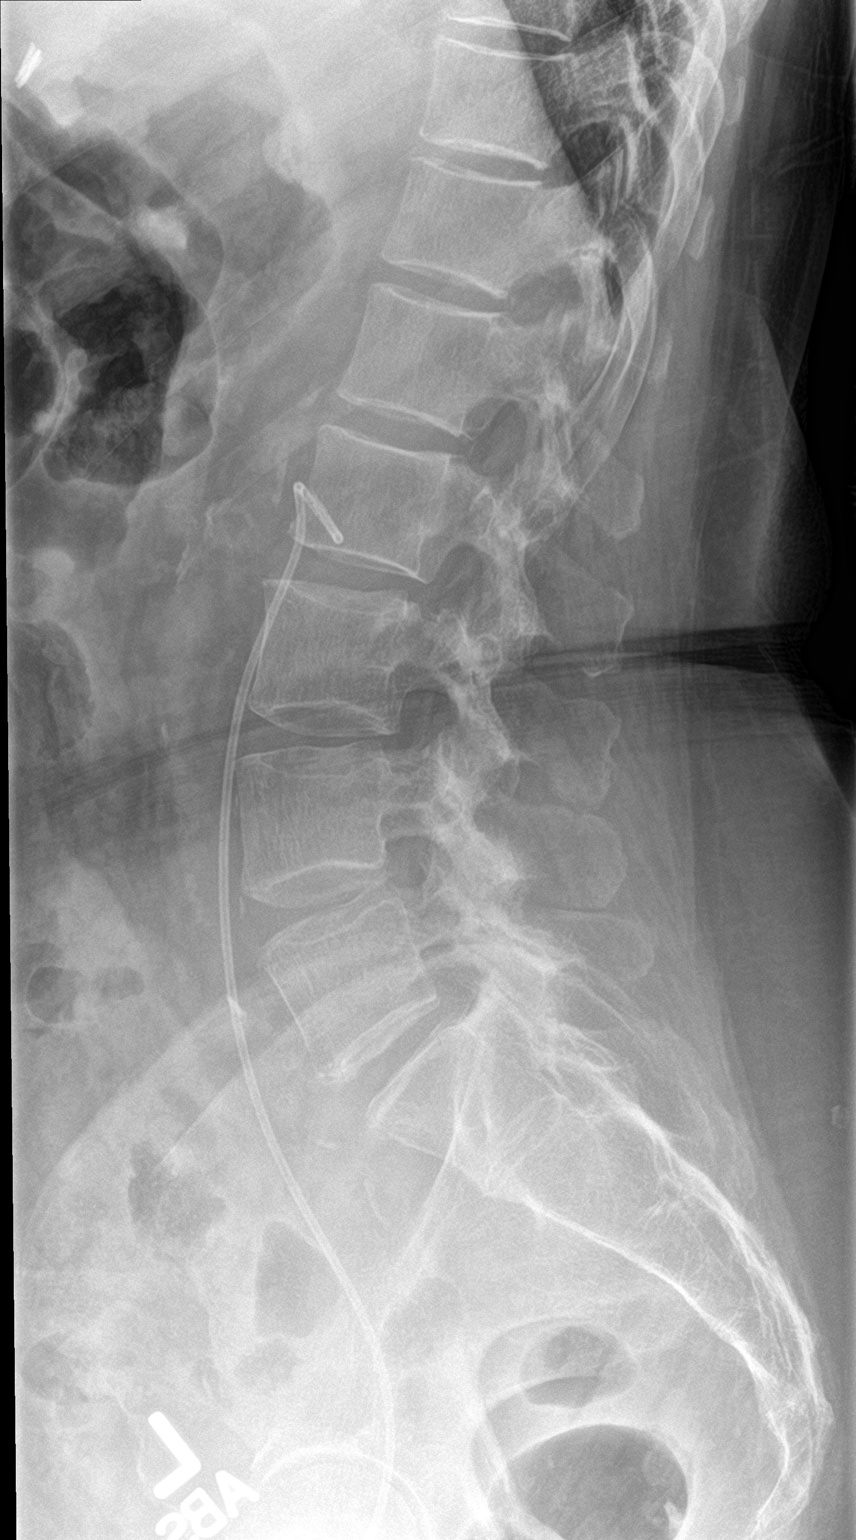

[l-spine spot]
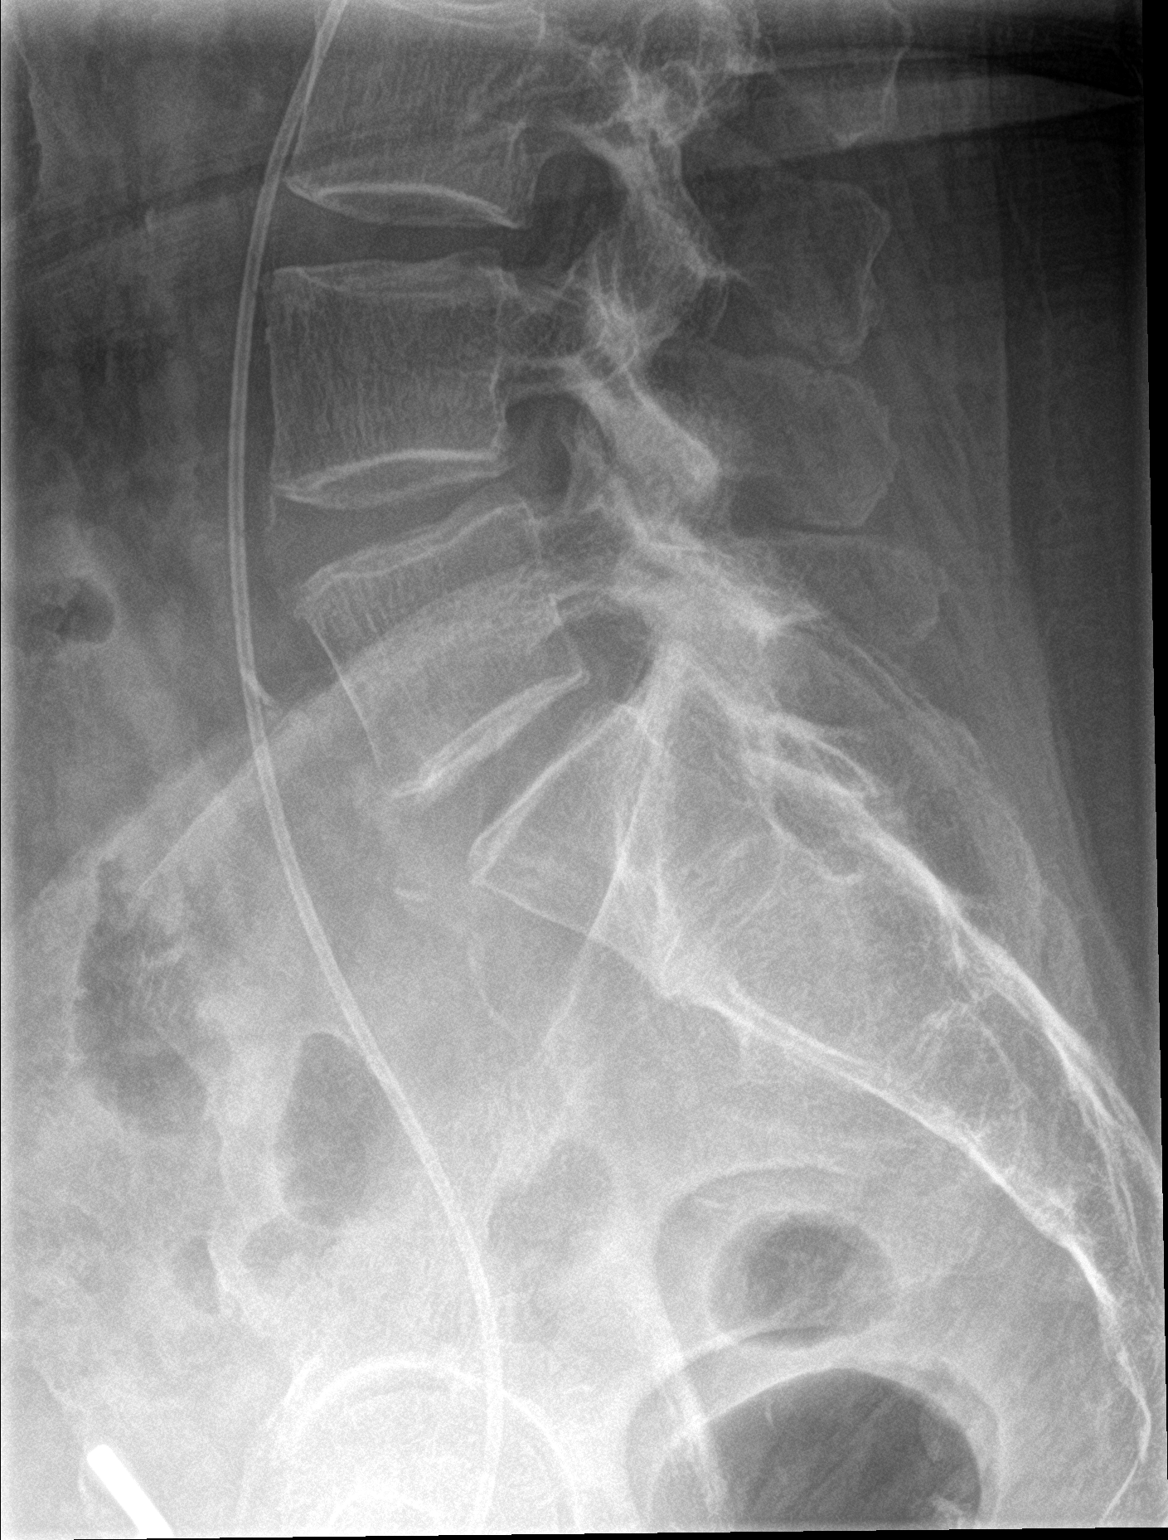

[3 of 3 positions shown; findings below may reference images not displayed]

FINDINGS: There is a chronic lumbar scoliosis with convexity to the left
centered at L2, unchanged. Lateral alignment is normal. No disc
space narrowing. No fractures. Facet arthritis at L5-S1 on the left,
unchanged.

Right ureteral stent in place, unchanged.

Aortic atherosclerosis.
IMPRESSION: 1. No acute abnormality.
2.  Aortic Atherosclerosis ([id number]).

## 2021-07-06 IMAGING — CR DG PELVIS 1-2V
1 series · 1 of 1 positions shown · non-contrast
Comparison: 07/14/2019

CLINICAL DATA: Lower back pain, hip pain and BILATERAL foot pain,
fell at home

EXAM:
PELVIS - 1-2 VIEW

[pelvis ap]
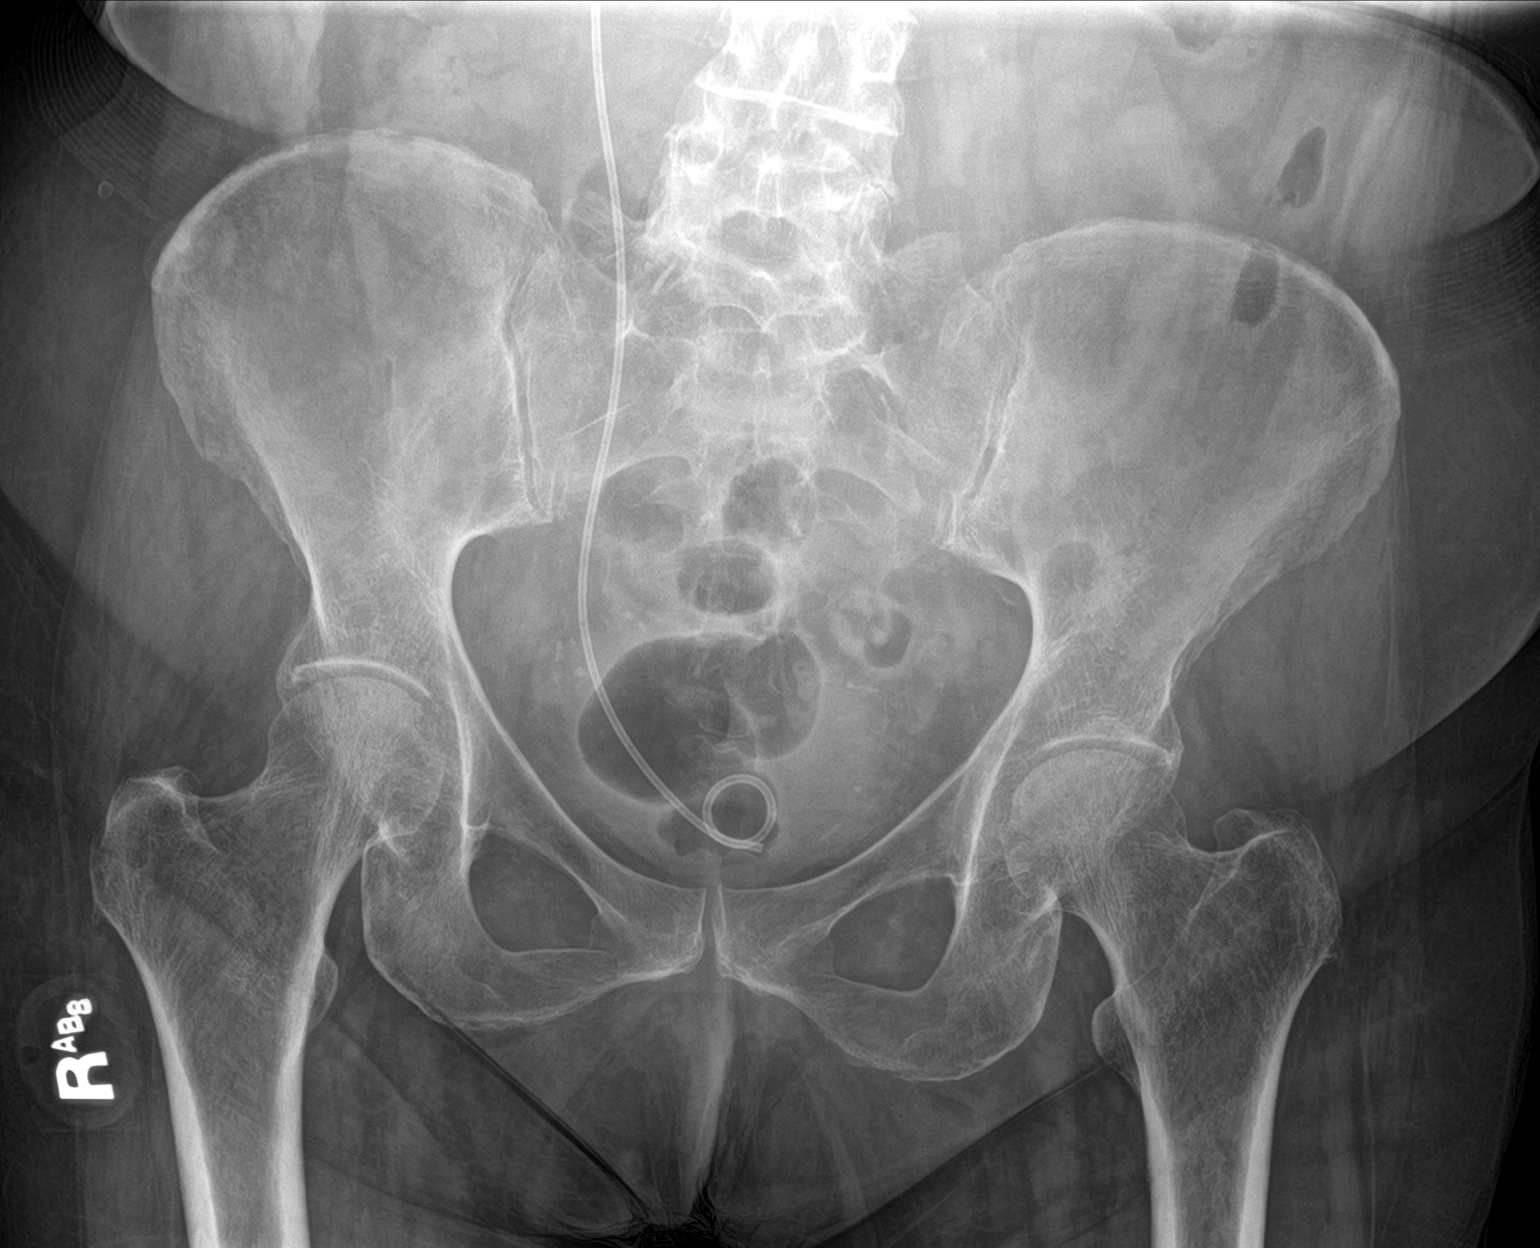

[1 of 1 positions shown; findings below may reference images not displayed]

FINDINGS: Mid and distal aspects of a RIGHT ureteral stent appear unchanged.

Diffuse osseous demineralization.

Hip and SI joint spaces preserved.

No fracture, dislocation, or bone destruction.
IMPRESSION: No acute abnormalities.

## 2021-07-06 IMAGING — CT CT MAXILLOFACIAL W/O CM
3 series · 17 of 47 positions shown, 20 images · non-contrast
Comparison: None.

CLINICAL DATA: Fall

EXAM:
CT MAXILLOFACIAL WITHOUT CONTRAST
TECHNIQUE: Multidetector CT imaging of the maxillofacial structures was
performed. Multiplanar CT image reconstructions were also generated.

[Series 2: max soft · axial · 0.28mm/px · z∈[+438,+562]mm · 11 of 74 slices shown, 14 images]
[im 6/74  brain]
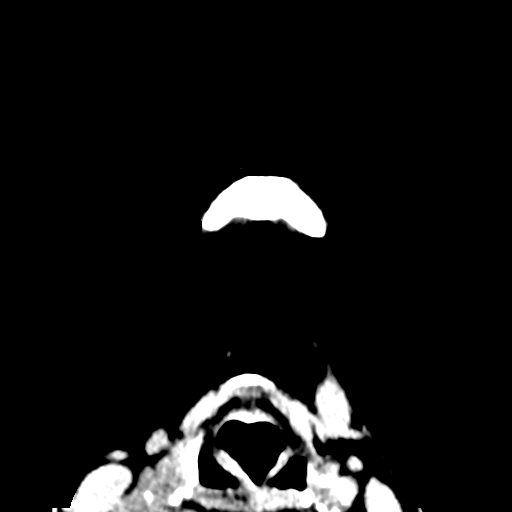
[im 6/74  bone]
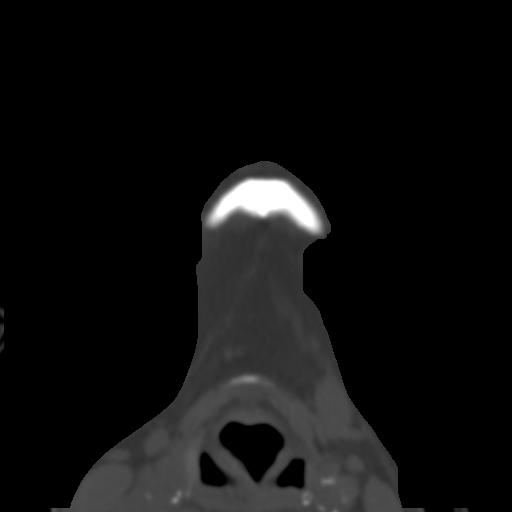
[im 11/74  bone]
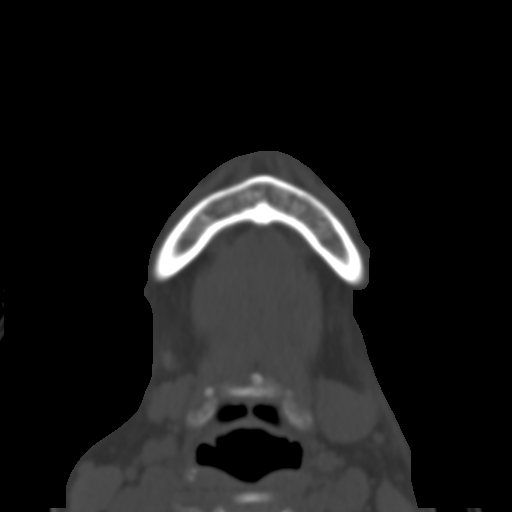
[im 18/74  bone]
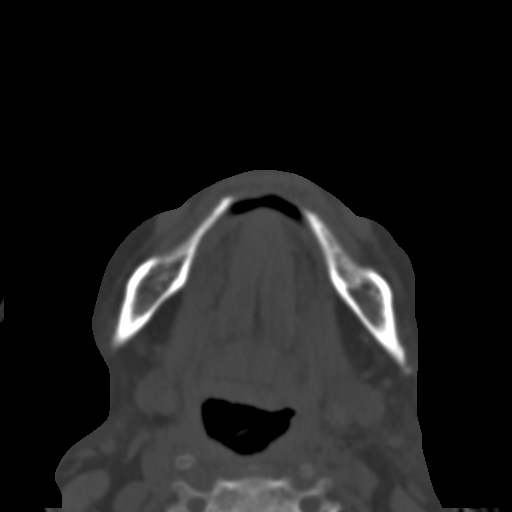
[im 23/74  bone]
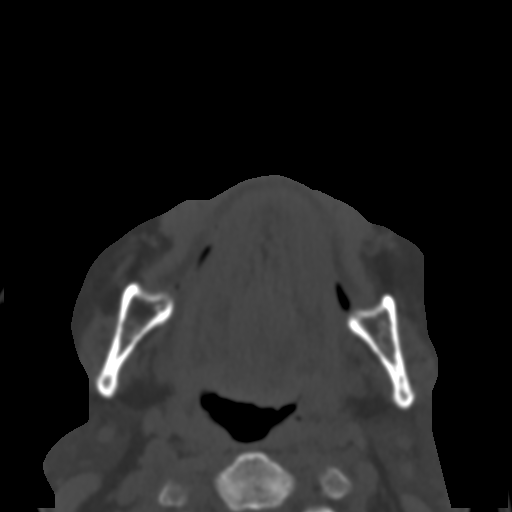
[im 31/74  brain]
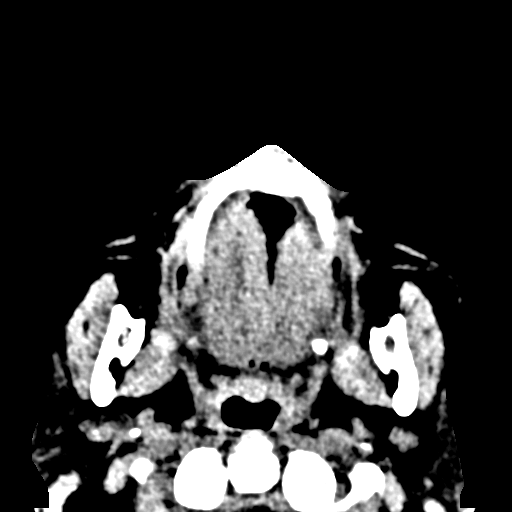
[im 31/74  bone]
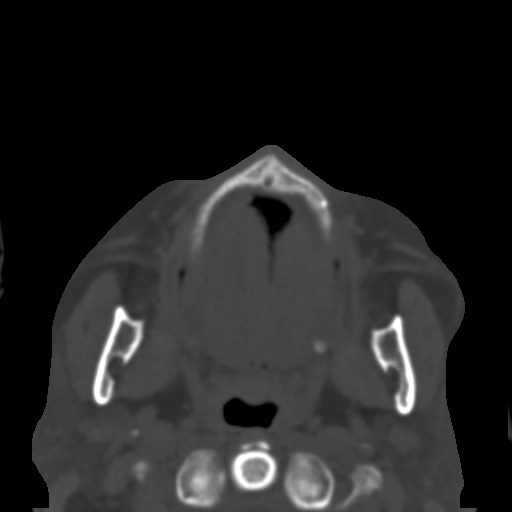
[im 38/74  bone]
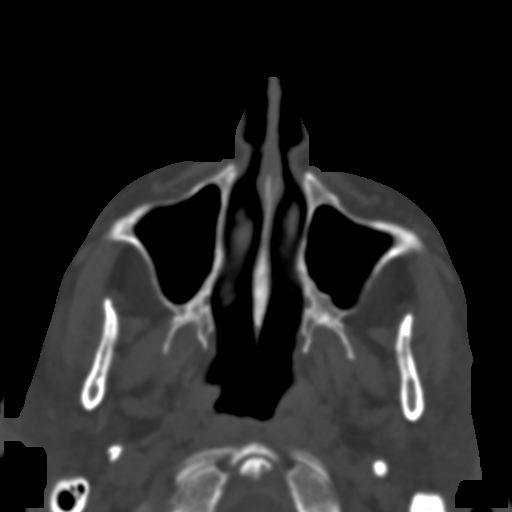
[im 43/74  bone]
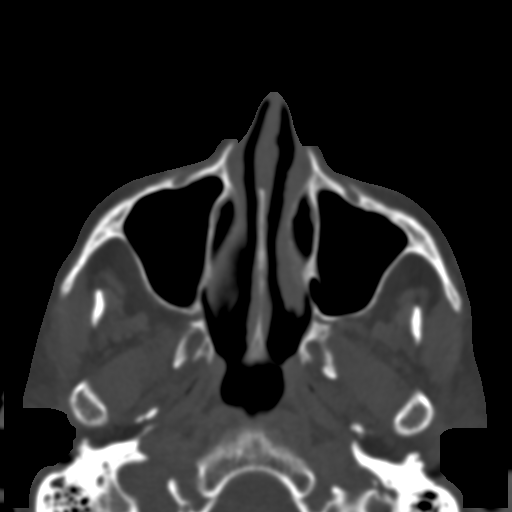
[im 51/74  bone]
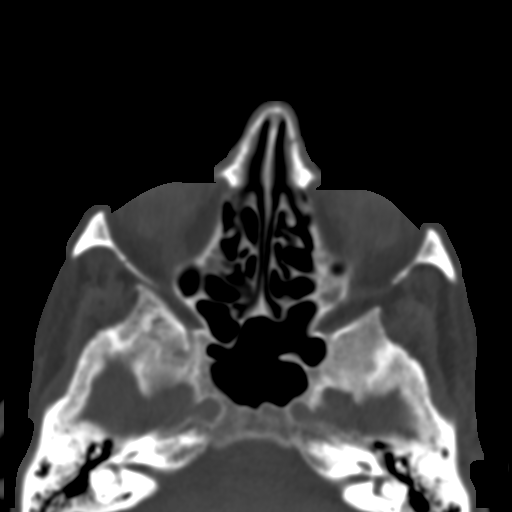
[im 56/74  brain]
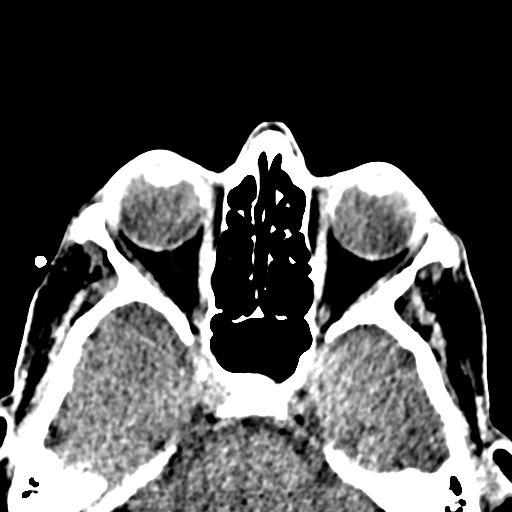
[im 56/74  bone]
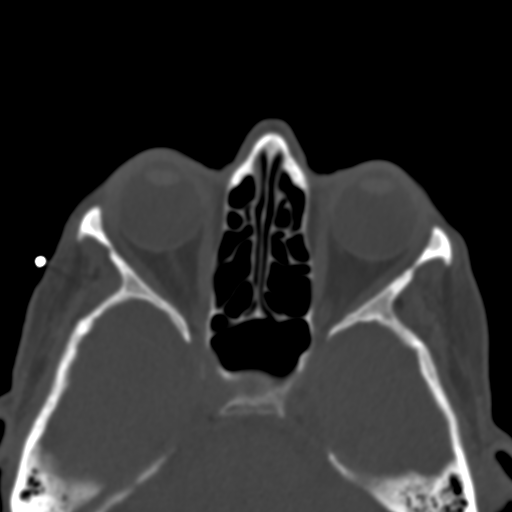
[im 63/74  bone]
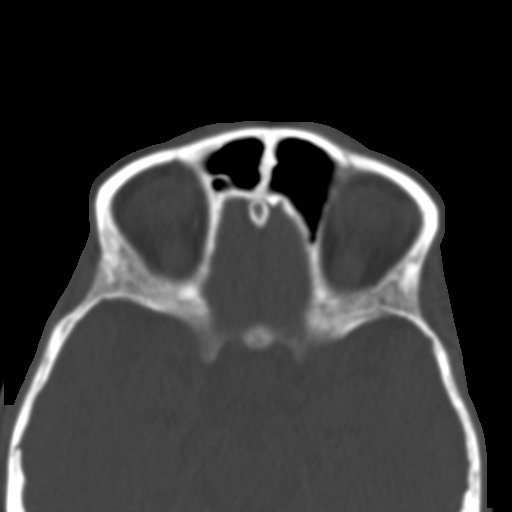
[im 68/74  bone]
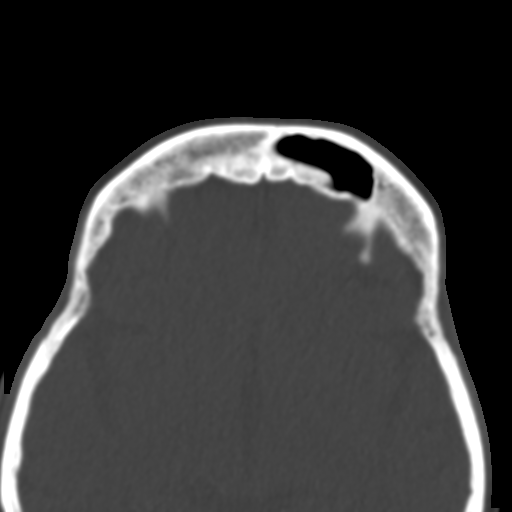

[Series 6: coronal soft · coronal · 0.31mm/px · 3 of 75 slices shown]
[im 25/75  bone]
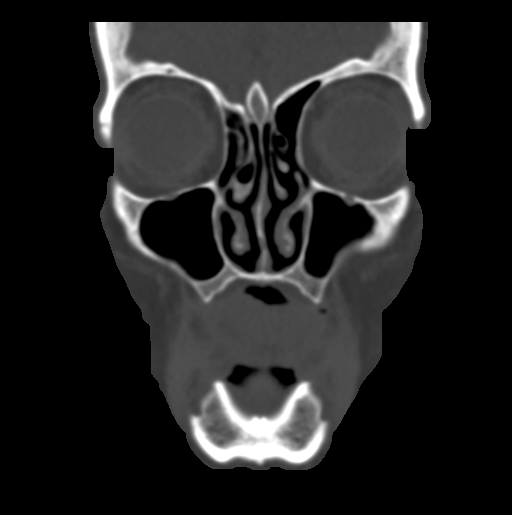
[im 33/75  bone]
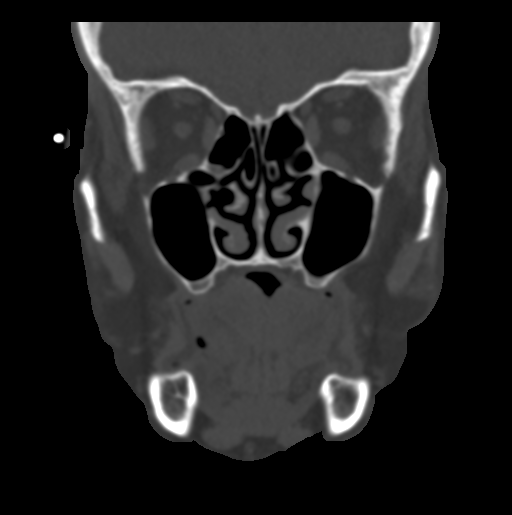
[im 42/75  bone]
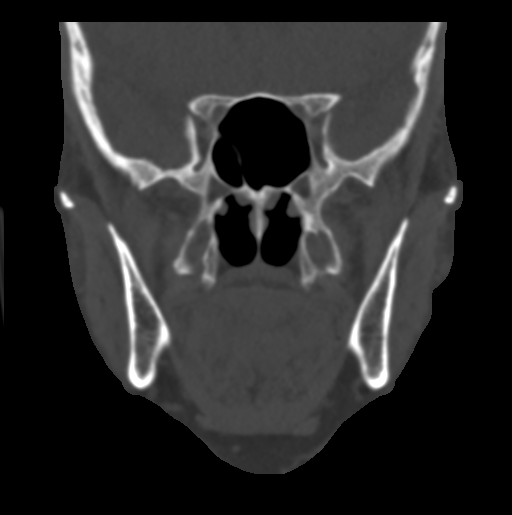

[Series 7: sagittal soft · sagittal · 0.29mm/px · 3 of 80 slices shown]
[im 27/80  bone]
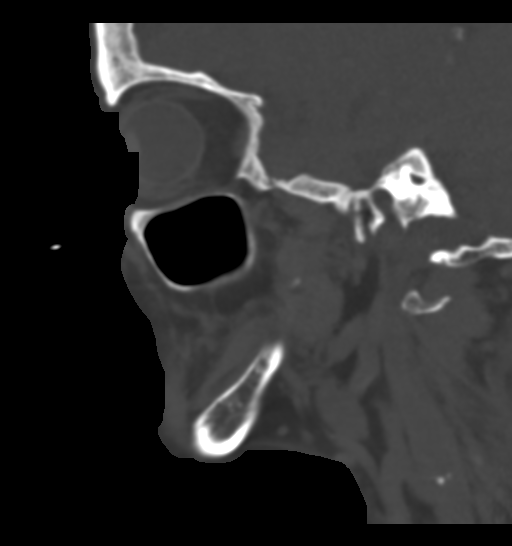
[im 40/80  bone]
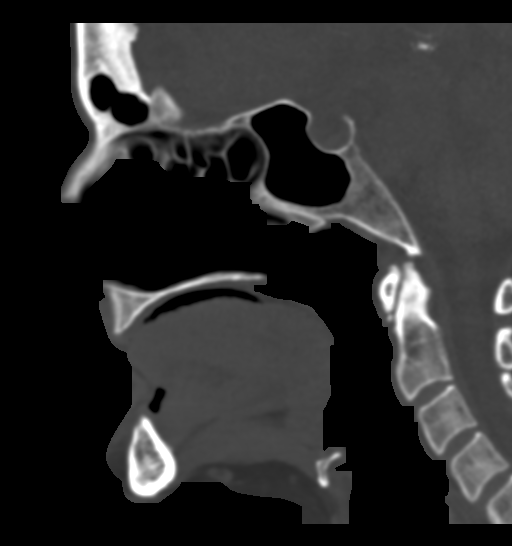
[im 53/80  bone]
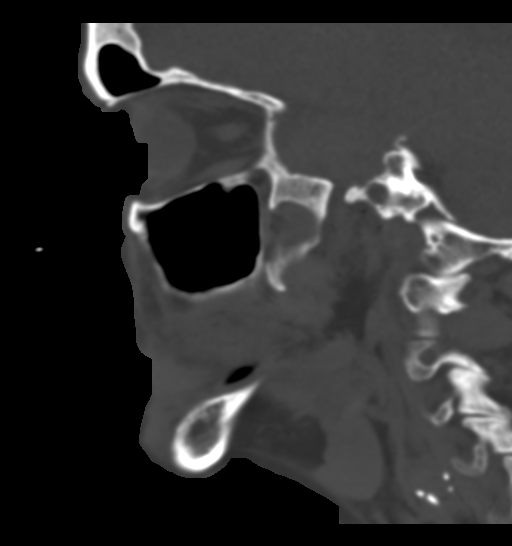

[17 of 47 positions shown; findings below may reference images not displayed]

FINDINGS: Osseous: No acute fracture. Temporomandibular joints are
unremarkable.

Orbits: No intraorbital hematoma.

Sinuses: Minor mucosal thickening.

Soft tissues: Negative.

Limited intracranial: Dictated separately.
IMPRESSION: No acute facial fracture.

## 2021-07-06 IMAGING — CR DG FOOT COMPLETE 3+V*R*
3 series · 3 of 3 positions shown · non-contrast
Comparison: None

CLINICAL DATA: BILATERAL foot pain post fall

EXAM:
RIGHT FOOT COMPLETE - 3+ VIEW

[foot ap]
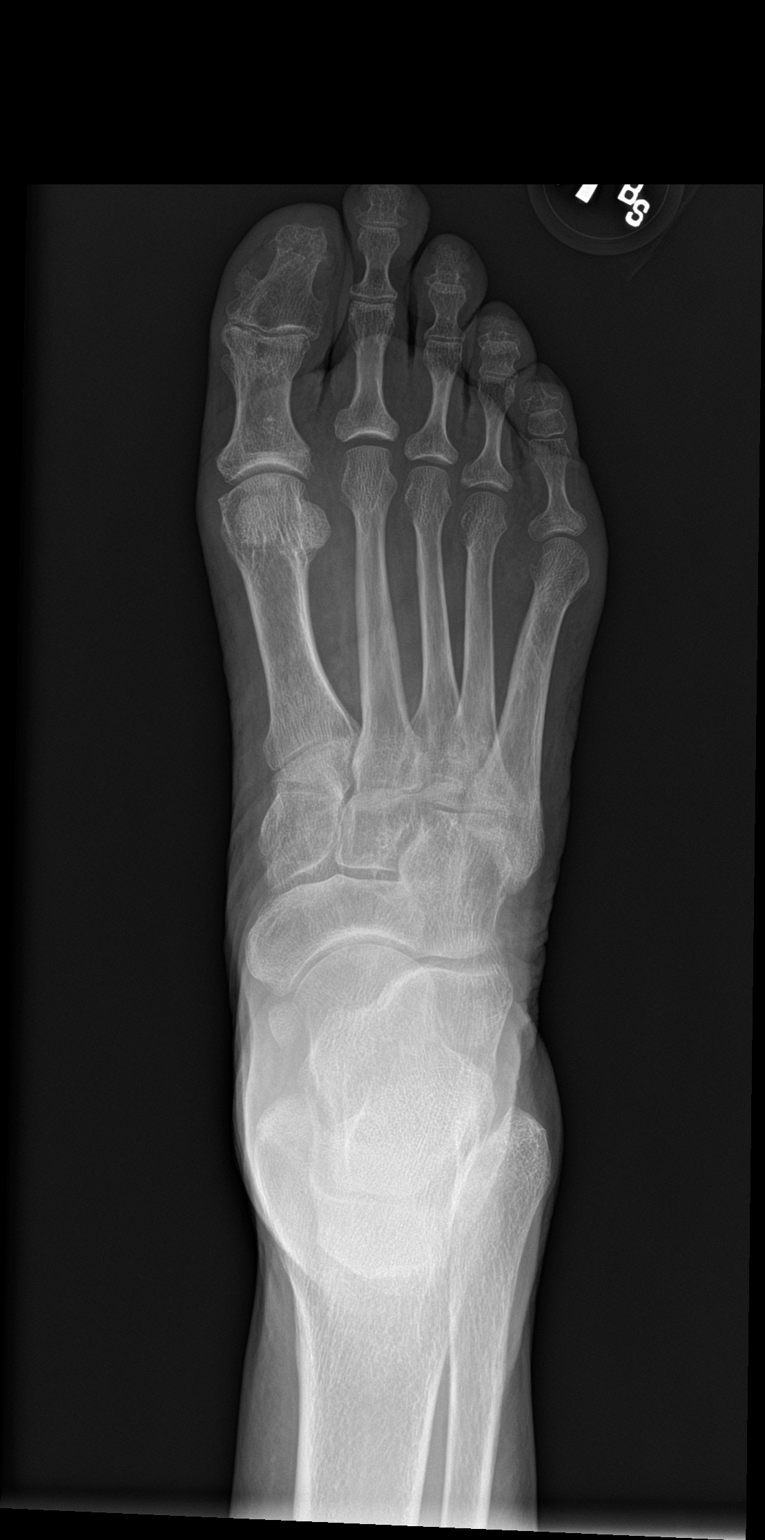

[foot obl]
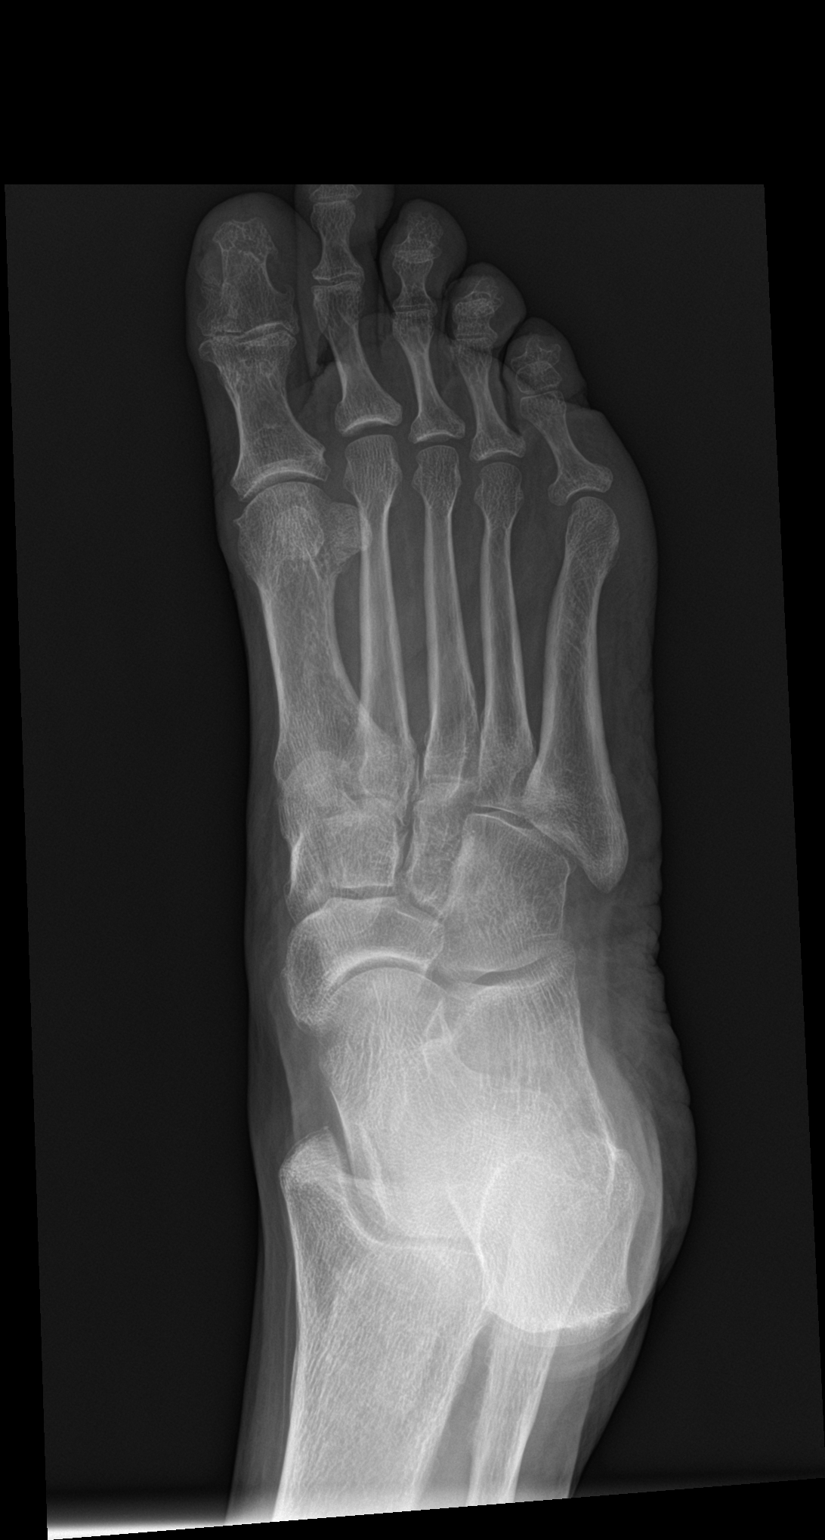

[foot lat]
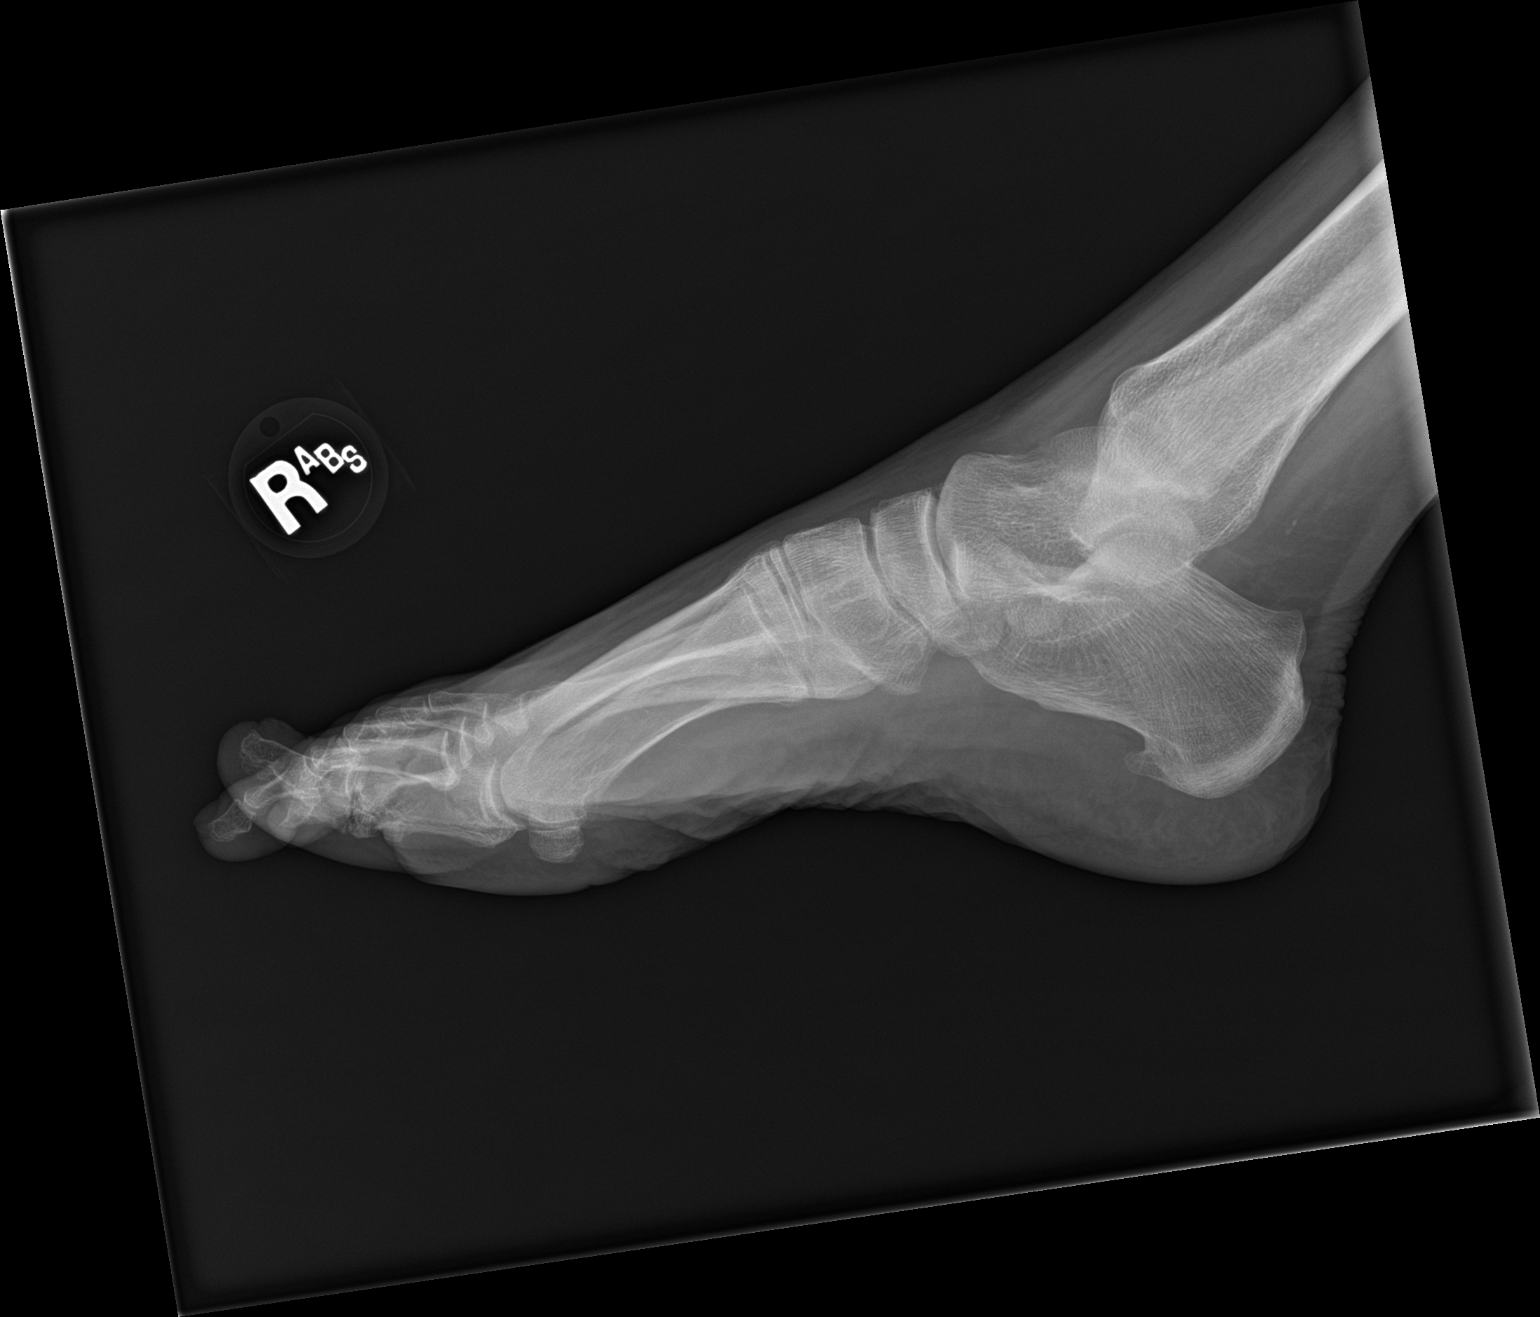

[3 of 3 positions shown; findings below may reference images not displayed]

FINDINGS: Osseous demineralization.

Degenerative changes at IP joint great toe.

Remaining joint spaces preserved.

Non fused accessory ossicle at medial margin of mid foot.

No acute fracture, dislocation, or bone destruction.

Small plantar calcaneal spur.
IMPRESSION: No acute osseous abnormalities.

## 2021-07-07 LAB — CULTURE, BLOOD (SINGLE)

## 2021-07-11 IMAGING — DX DG FOOT COMPLETE 3+V*L*
3 series · 3 of 3 positions shown · non-contrast
Comparison: 08/13/2019

CLINICAL DATA: 63 year old female with recurrent falls since last
xray of left foot. Moderate bruising to dorsal foot during exam
today. Please evaluate.acute foot pain s/p numerous falls

EXAM:
LEFT FOOT - COMPLETE 3+ VIEW

[foot ap]
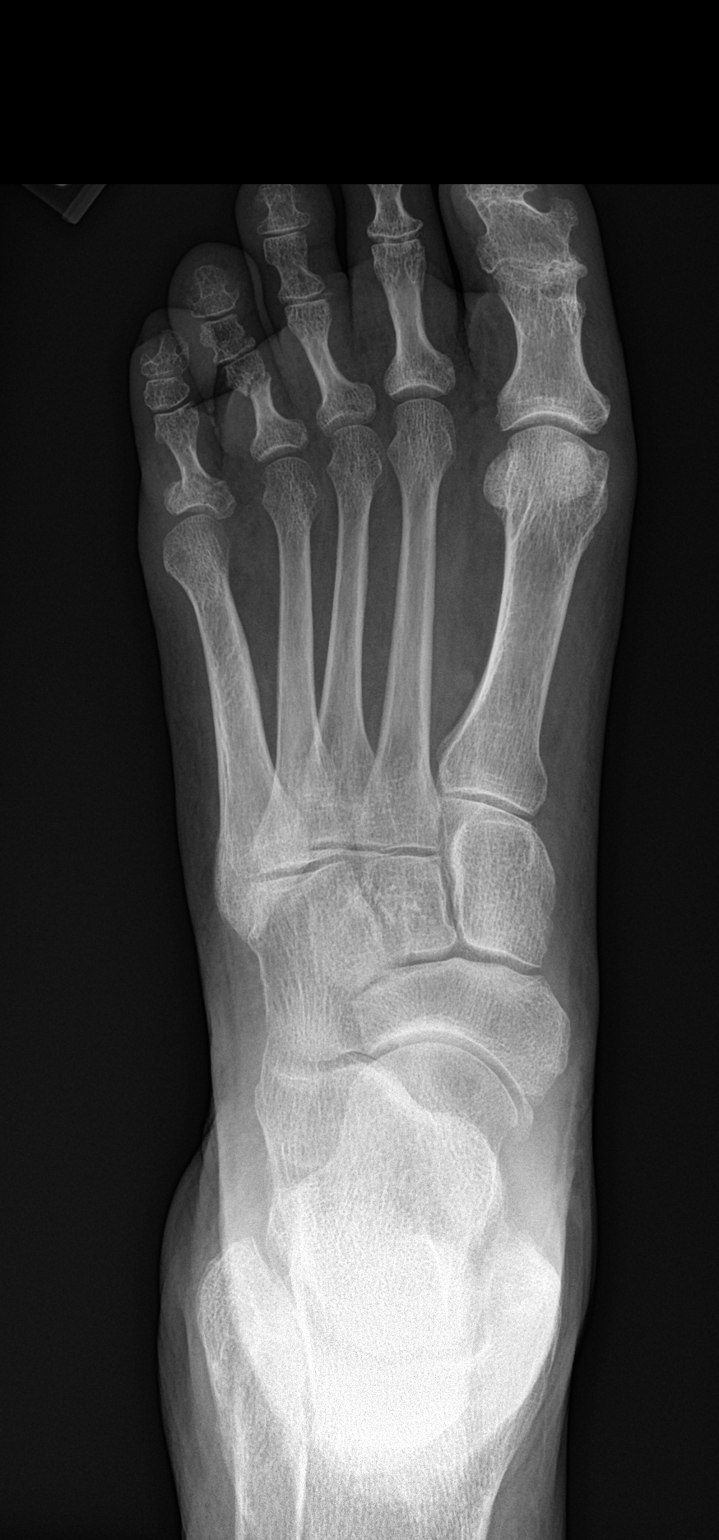

[foot obl]
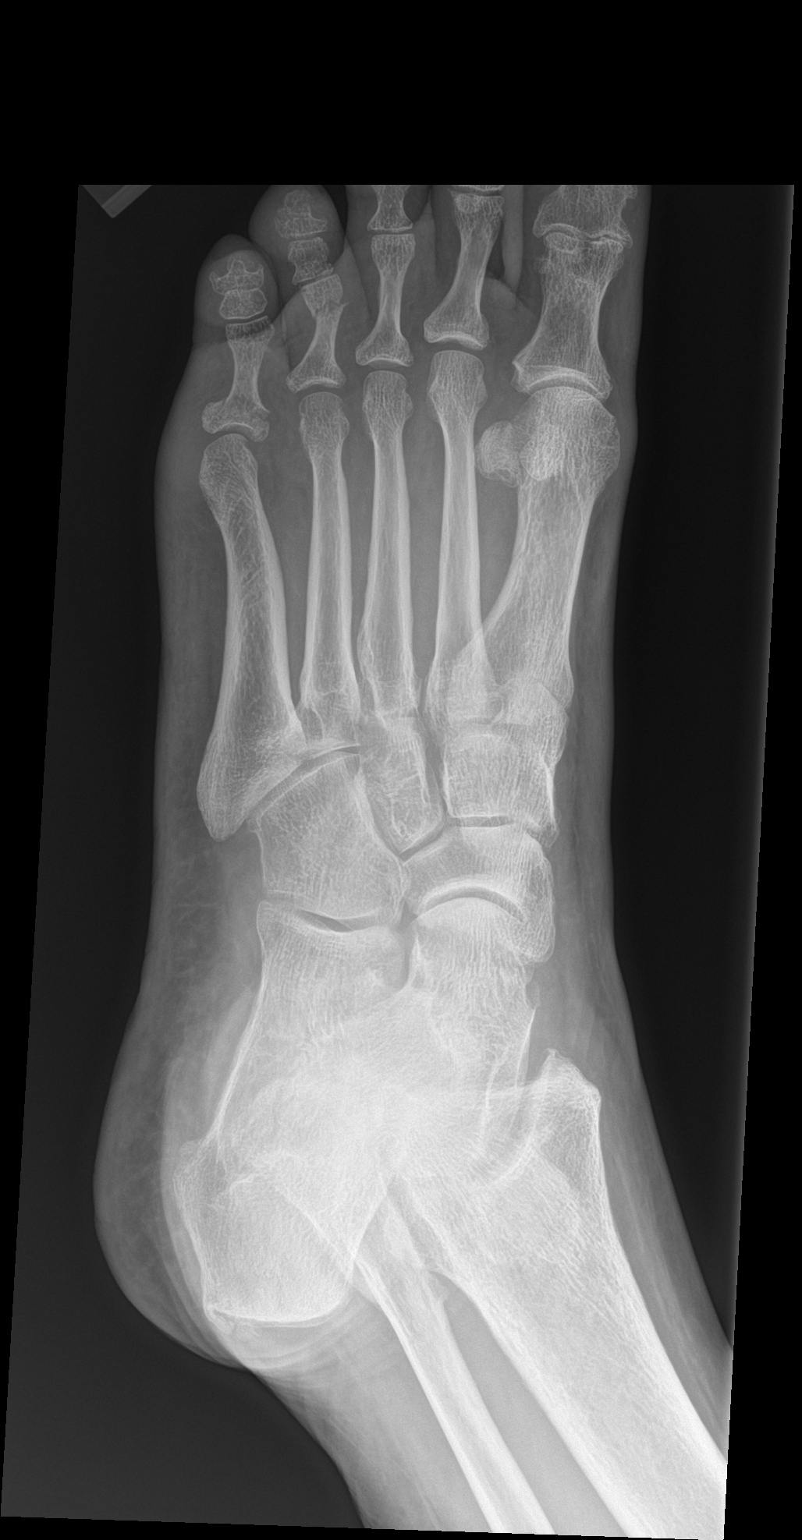

[foot lat]
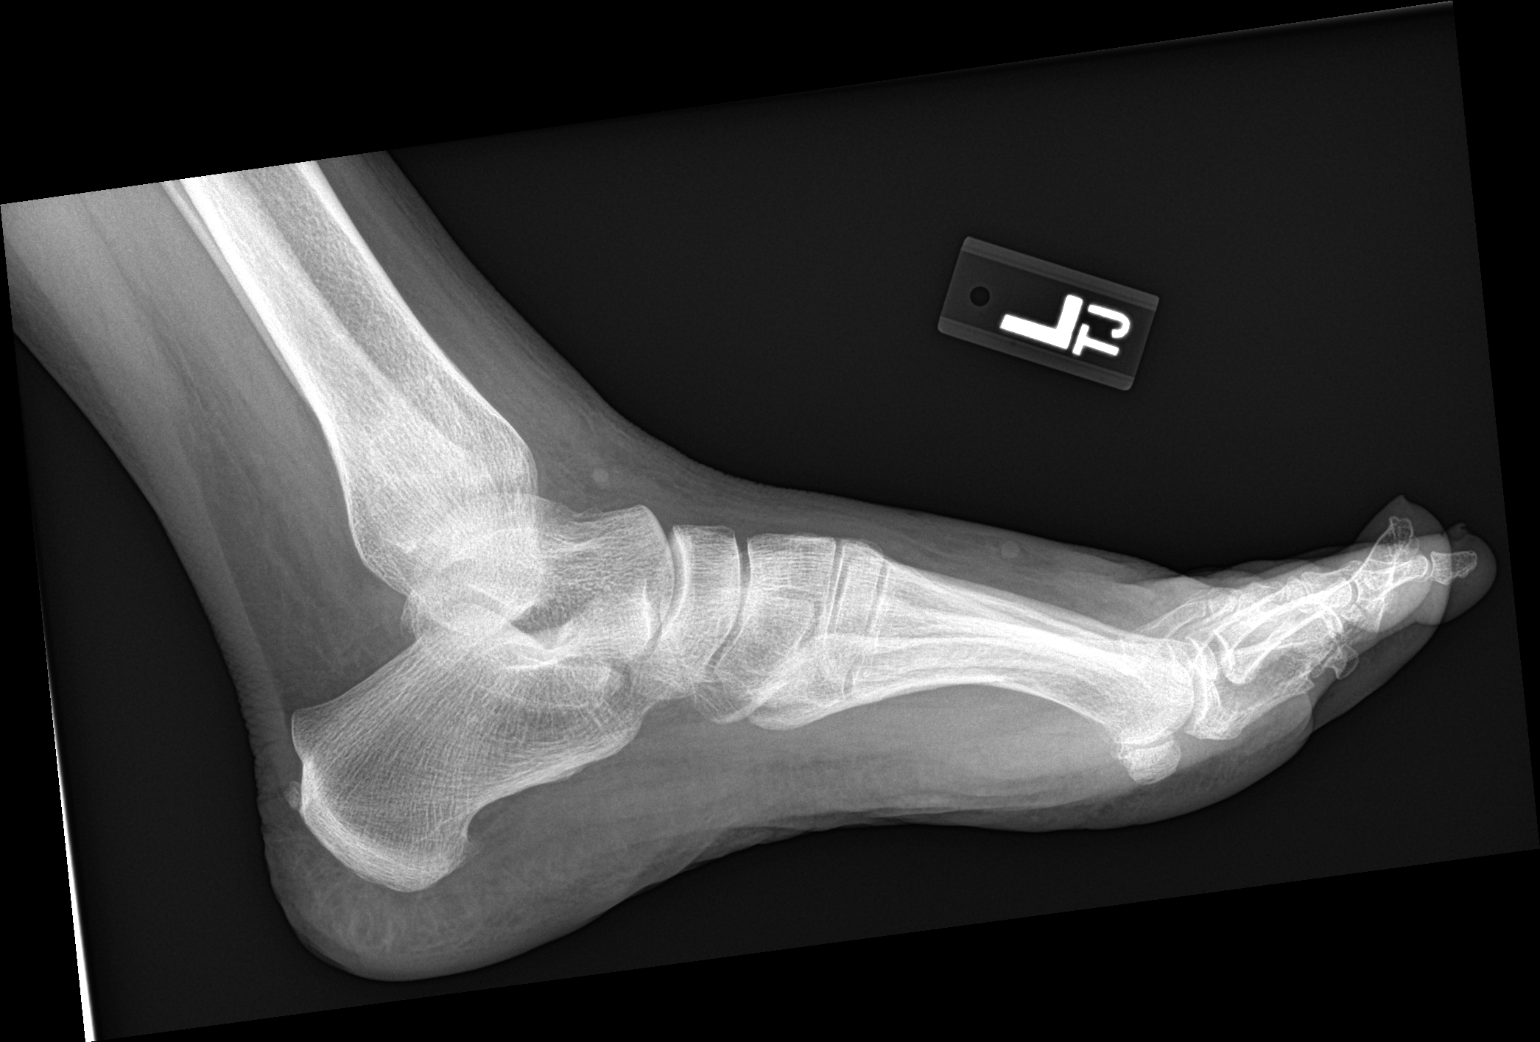

[3 of 3 positions shown; findings below may reference images not displayed]

FINDINGS: There is an acute fracture of the fourth proximal phalanx. Minimally
displaced fracture at the base of the fourth middle phalanx involves
the articular surface. There is an acute fracture of the base of the
5th proximal phalanx. Fracture likely extends to the articular
surface.
IMPRESSION: 1. Fractures of the fourth proximal and middle phalanges.
2. Acute fracture of the base of the 5th proximal phalanx.

## 2021-07-19 ENCOUNTER — Ambulatory Visit: Payer: Self-pay | Admitting: *Deleted

## 2021-07-19 IMAGING — CT CT HEAD W/O CM
3 series · 15 of 45 positions shown, 18 images · non-contrast
Comparison: 08/13/2019

CLINICAL DATA: Multiple falls.  Head trauma.

EXAM:
CT HEAD WITHOUT CONTRAST
TECHNIQUE: Contiguous axial images were obtained from the base of the skull
through the vertex without intravenous contrast.

[Series 3: head wo · axial · 0.41mm/px · z∈[-115,+0]mm · 9 of 28 slices shown, 12 images]
[im 3/28  brain]
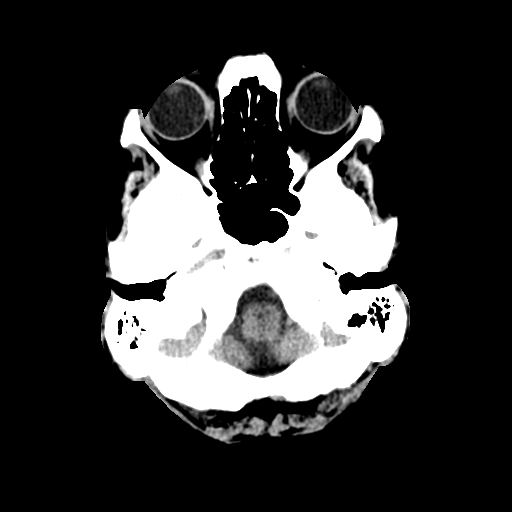
[im 3/28  bone]
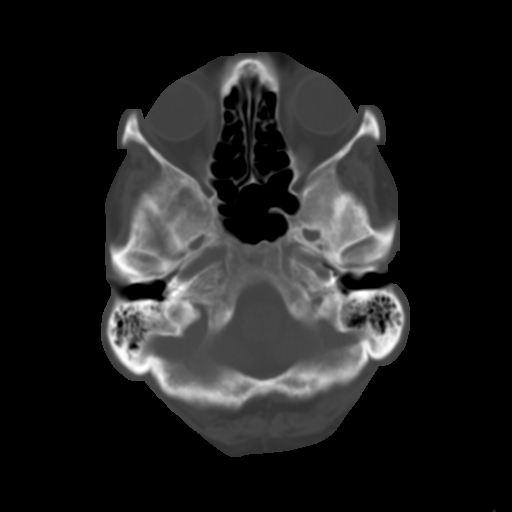
[im 6/28  brain]
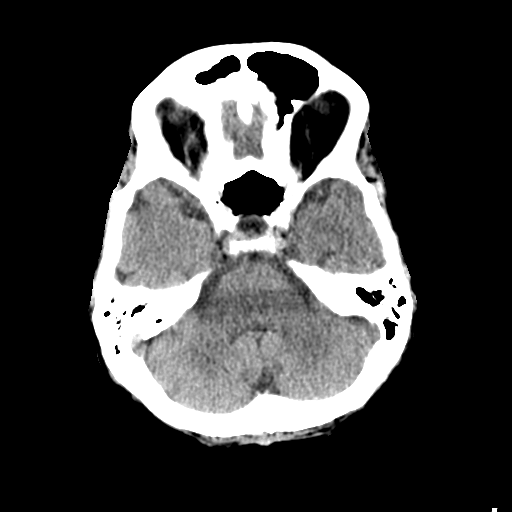
[im 9/28  brain]
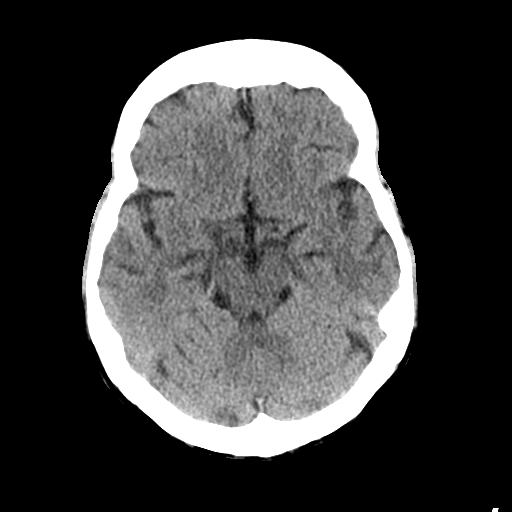
[im 12/28  brain]
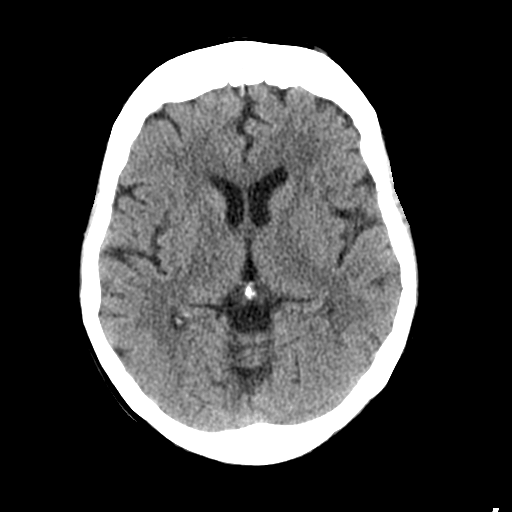
[im 15/28  brain]
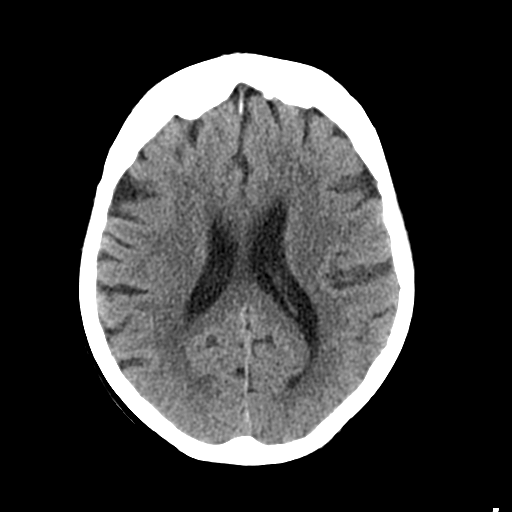
[im 15/28  bone]
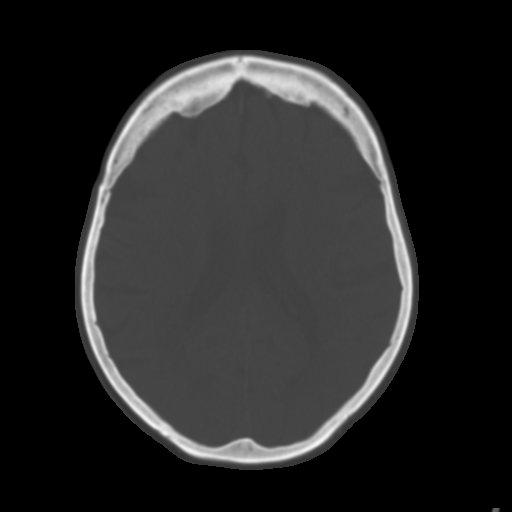
[im 17/28  brain]
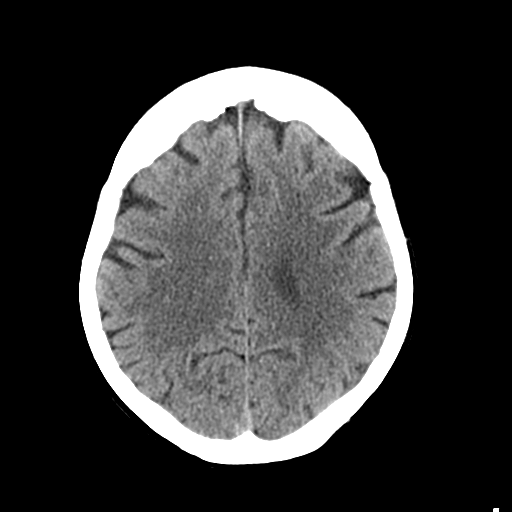
[im 20/28  brain]
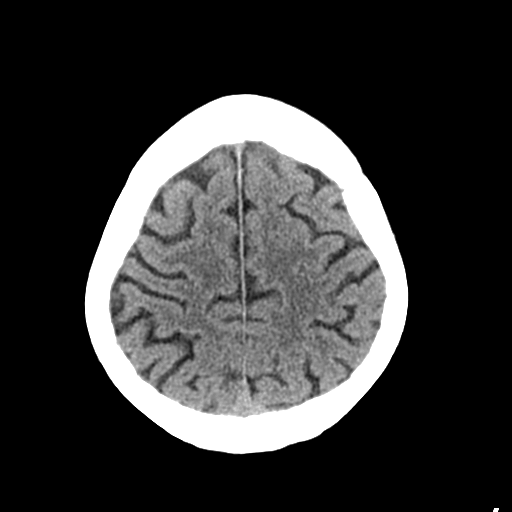
[im 23/28  brain]
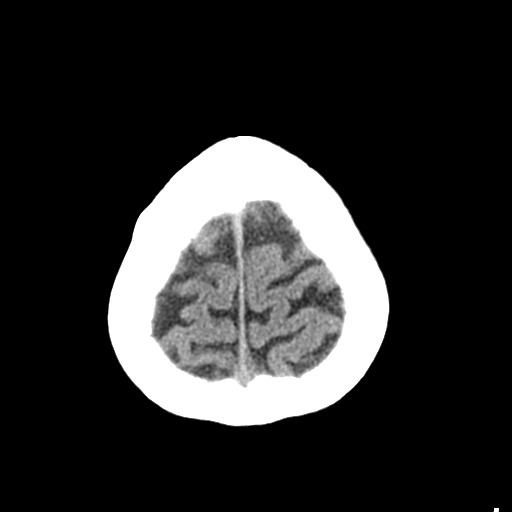
[im 26/28  brain]
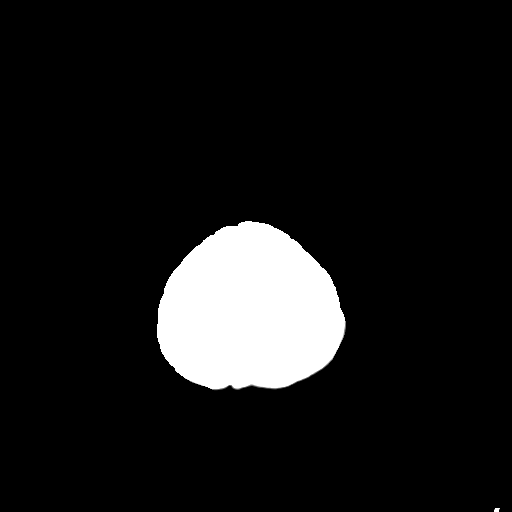
[im 26/28  bone]
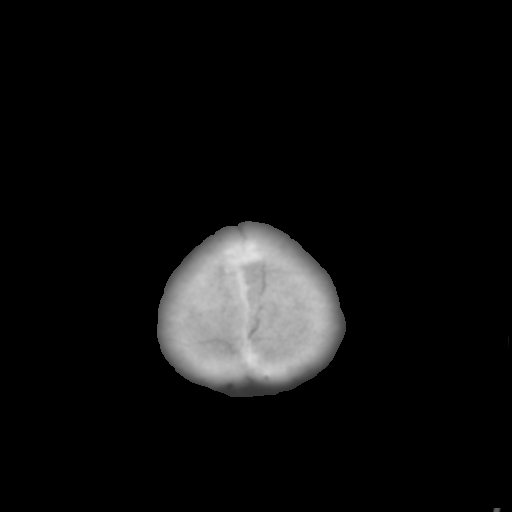

[Series 4: coronal soft tissue · coronal · 0.27mm/px · 3 of 60 slices shown]
[im 20/60  brain]
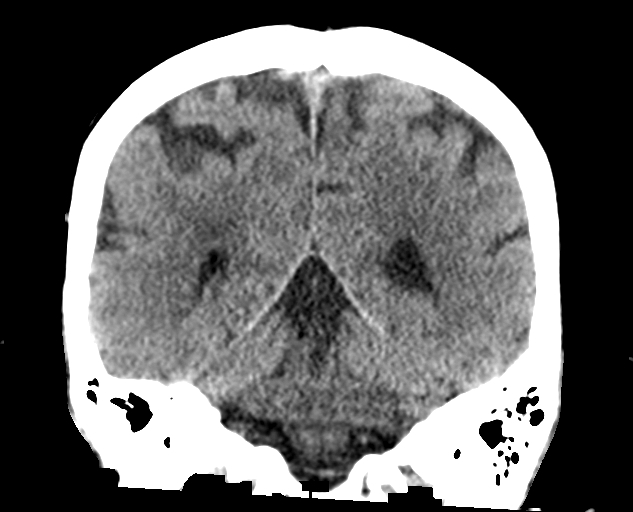
[im 27/60  brain]
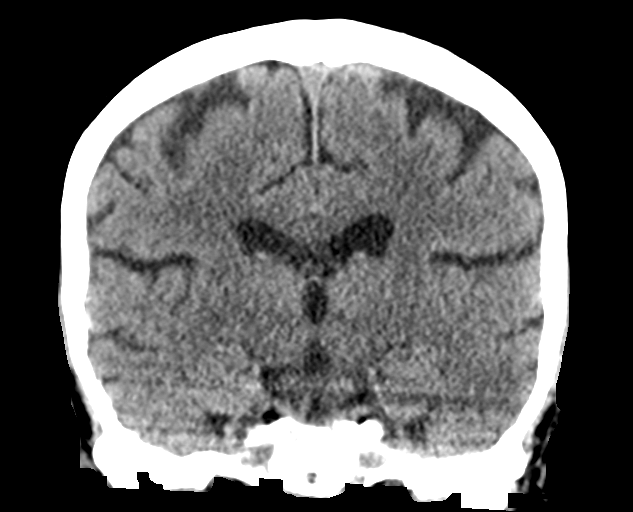
[im 33/60  brain]
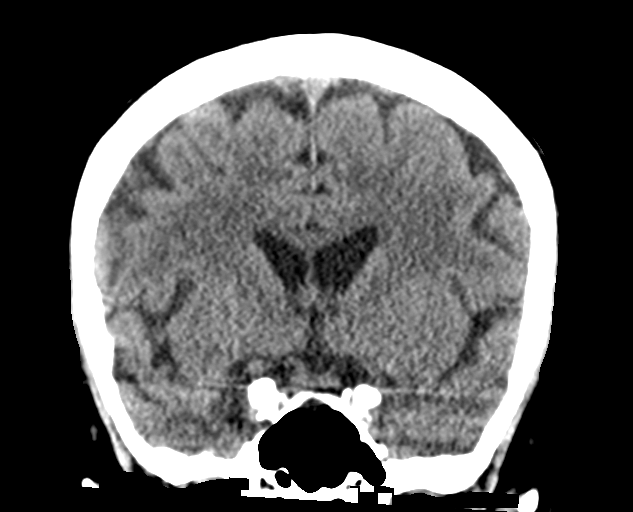

[Series 5: sagittal soft tissue · sagittal · 0.27mm/px · 3 of 51 slices shown]
[im 17/51  brain]
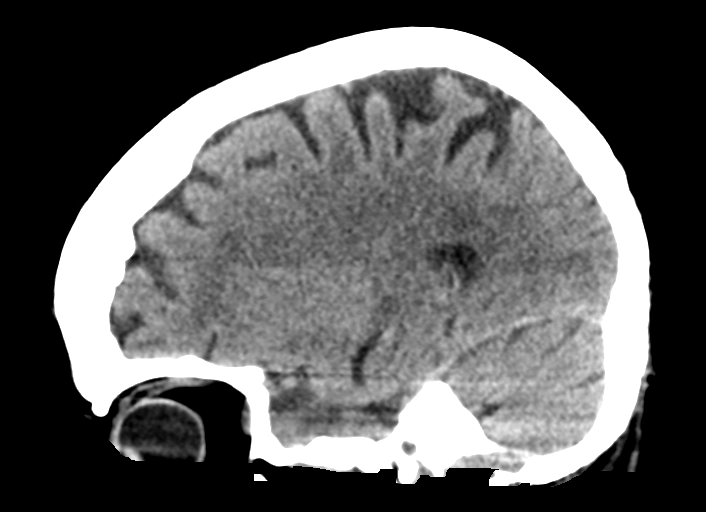
[im 26/51  brain]
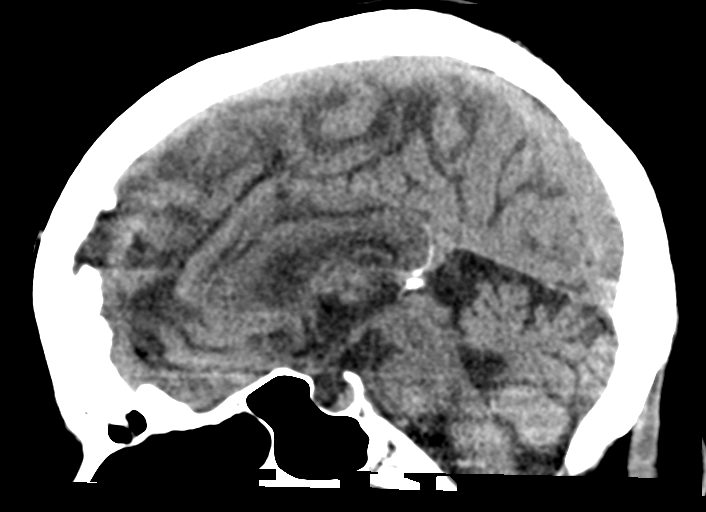
[im 34/51  brain]
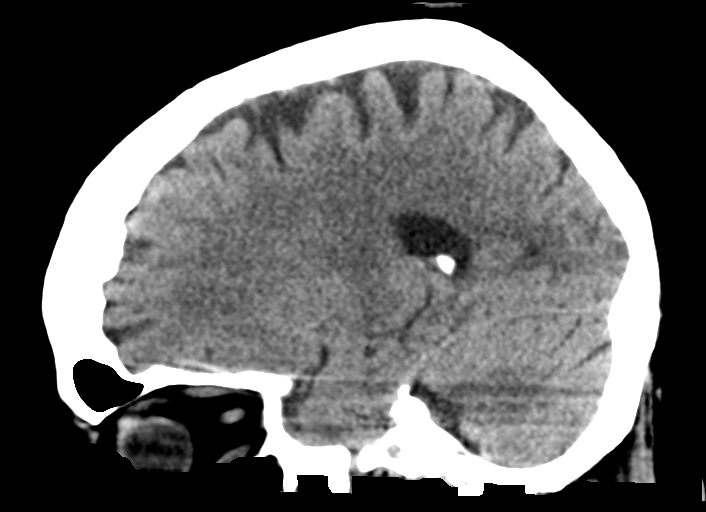

[15 of 45 positions shown; findings below may reference images not displayed]

FINDINGS: Brain: Mild age related volume loss. No acute intracranial
abnormality. Specifically, no hemorrhage, hydrocephalus, mass
lesion, acute infarction, or significant intracranial injury.

Vascular: No hyperdense vessel or unexpected calcification.

Skull: No acute calvarial abnormality.

Sinuses/Orbits: Visualized paranasal sinuses and mastoids clear.
Orbital soft tissues unremarkable.

Other: None
IMPRESSION: No acute intracranial abnormality.

## 2021-07-19 IMAGING — CR DG CHEST 2V
1 series · 2 of 2 positions shown · non-contrast
Comparison: 07/24/2019

CLINICAL DATA: Tachycardia, fever

EXAM:
CHEST - 2 VIEW

[Series 1: dg chest 2 view · 0.14mm/px · 2 of 2 slices shown]
[im 1/2]
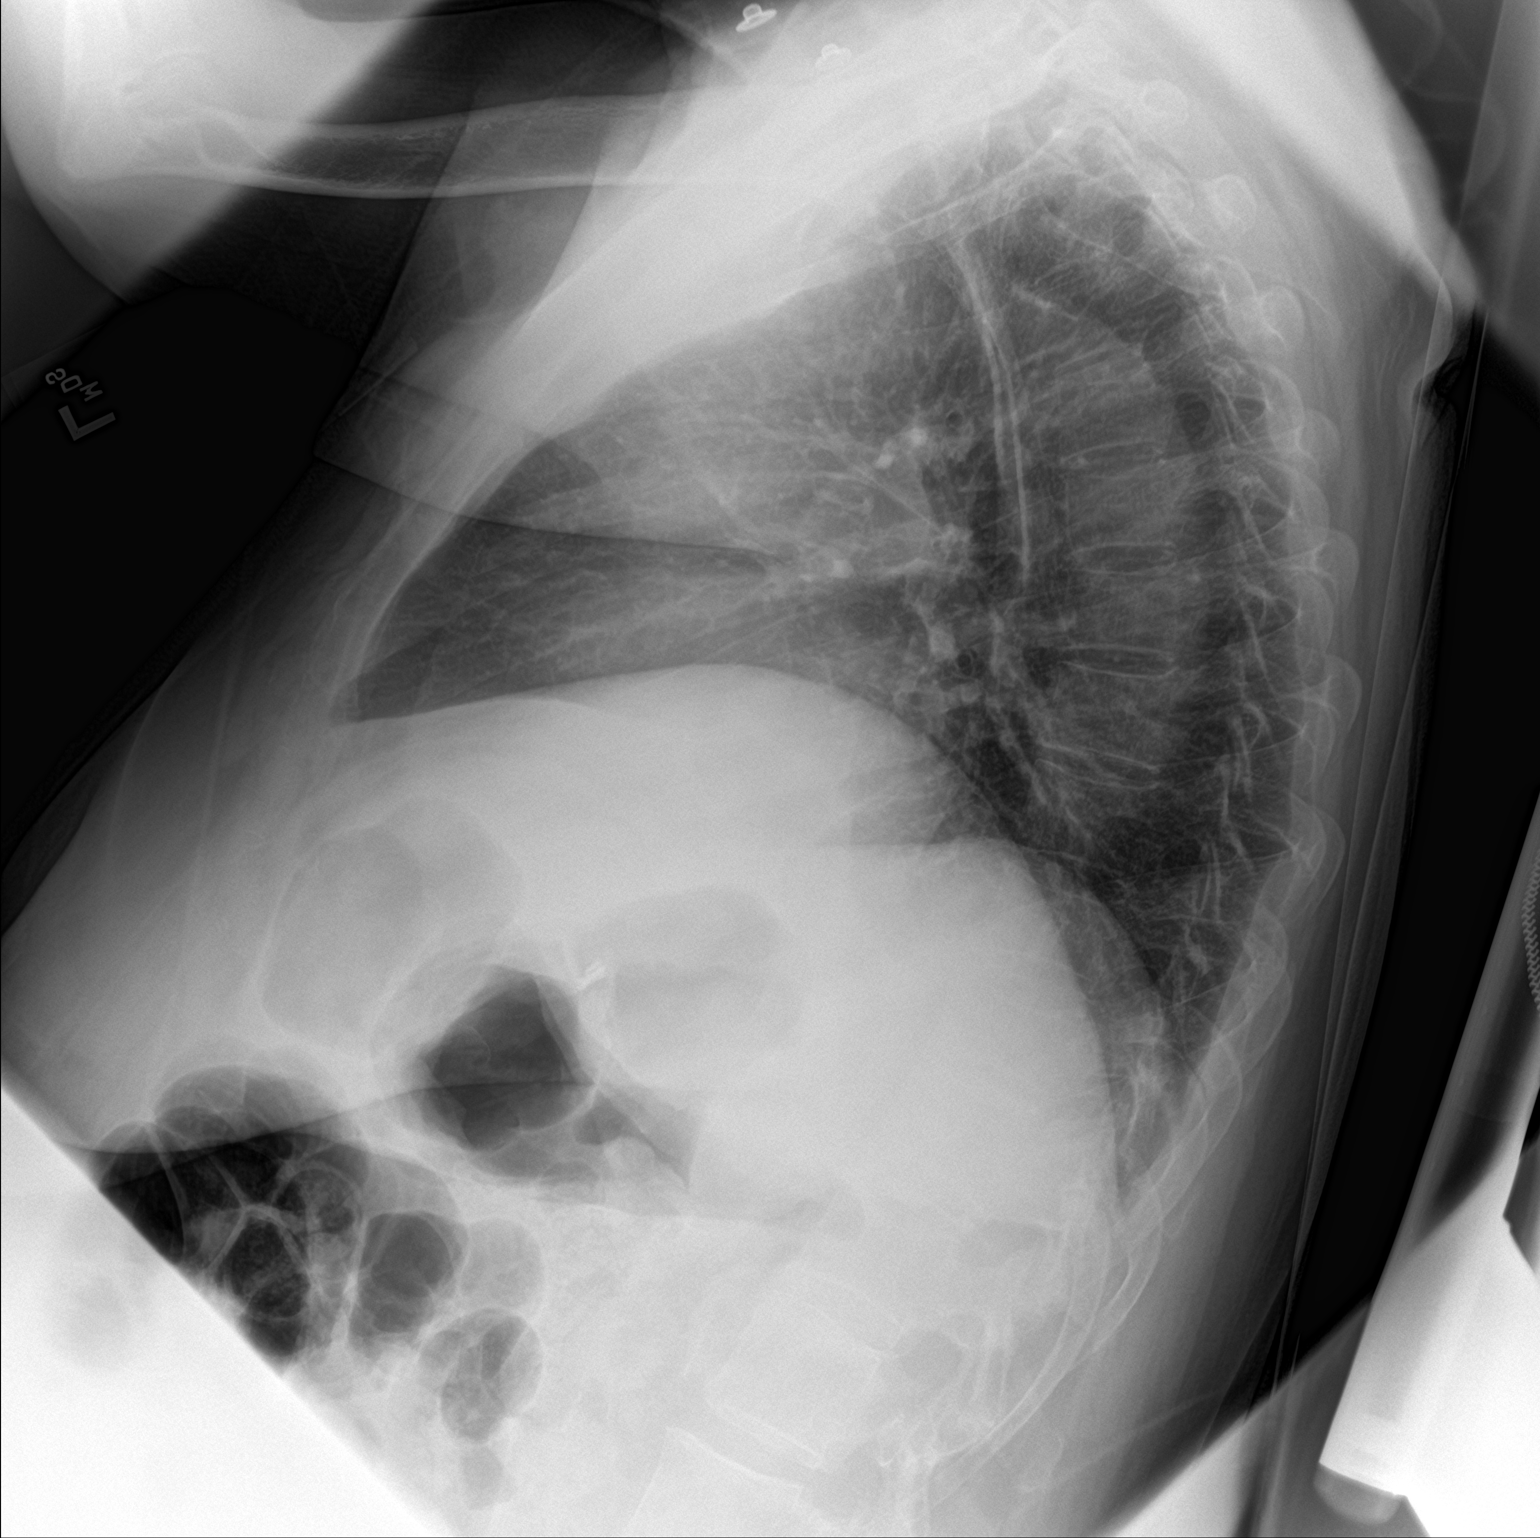
[im 2/2]
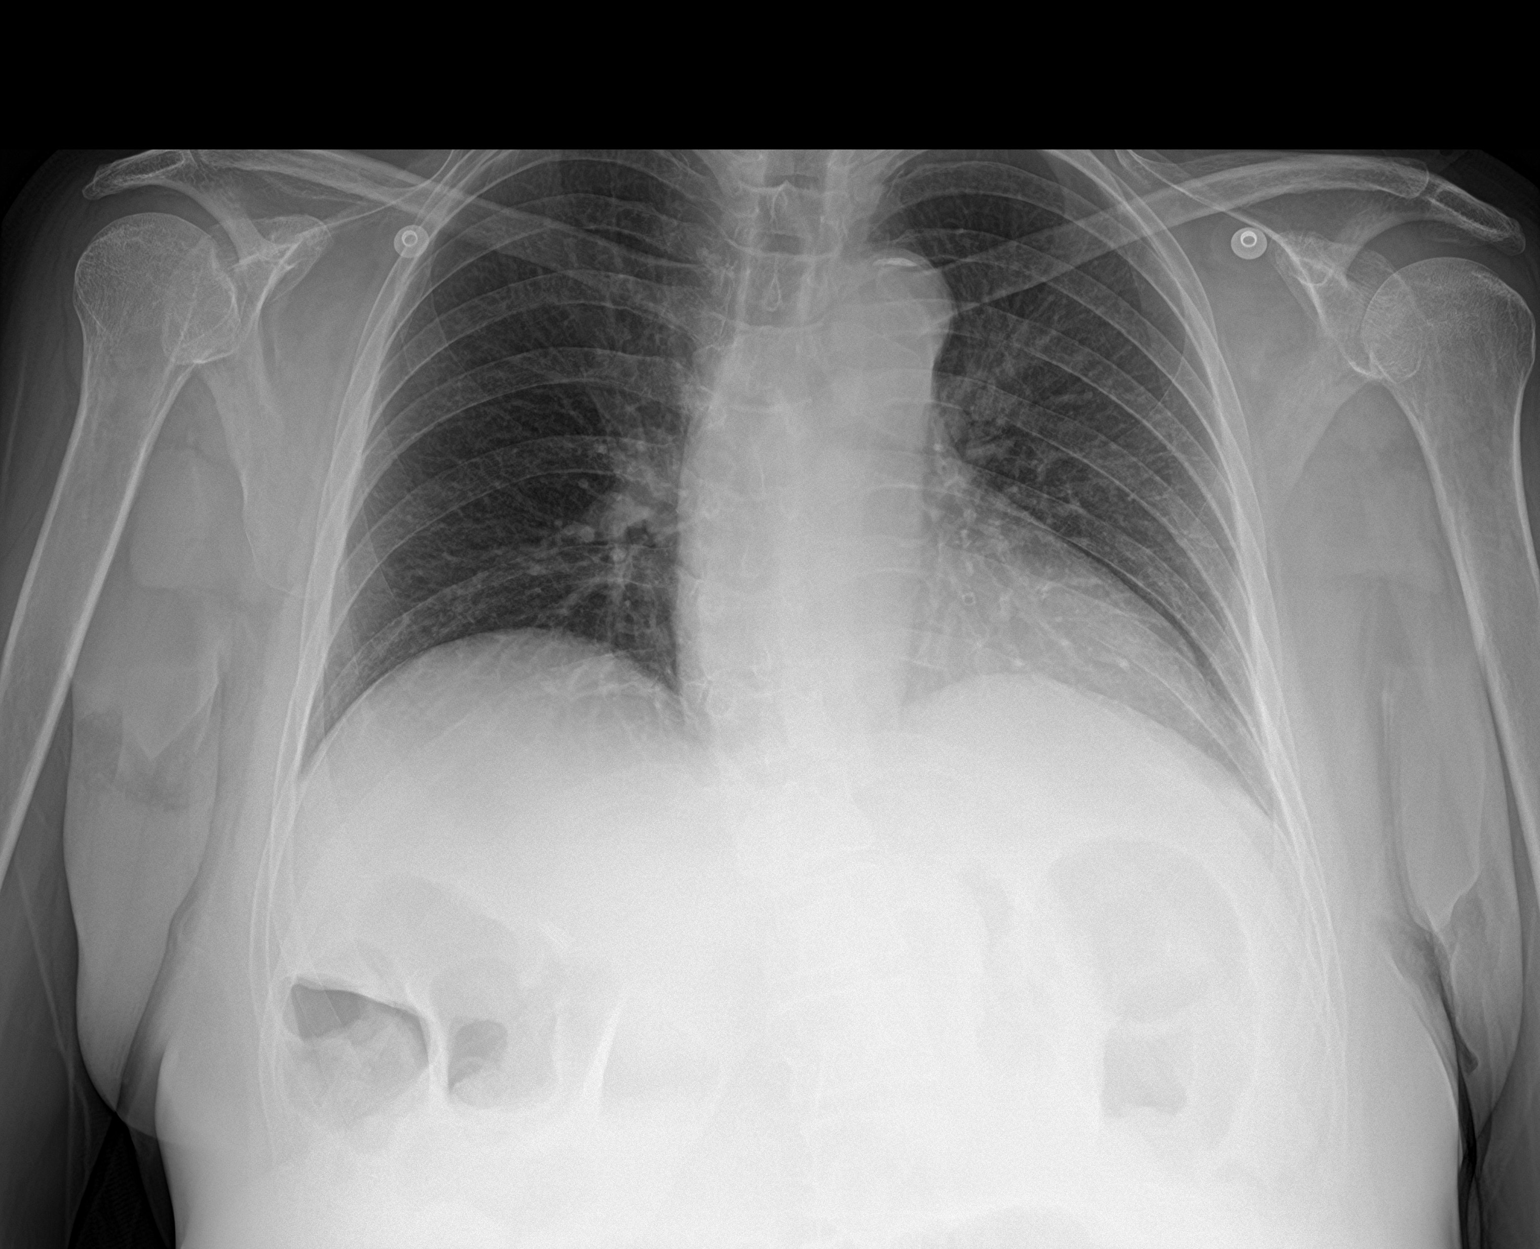

[2 of 2 positions shown; findings below may reference images not displayed]

FINDINGS: The heart size and mediastinal contours are within normal limits.
Calcific aortic knob. Both lungs are clear. The visualized skeletal
structures are unremarkable.
IMPRESSION: No active cardiopulmonary disease.

## 2021-07-19 NOTE — Telephone Encounter (Signed)
Answer Assessment - Initial Assessment Questions 1. LOCATION: "Where does it hurt?"      Upper abdomen 2. RADIATION: "Does the pain shoot anywhere else?" (e.g., chest, back)     Upper abdomen 3. ONSET: "When did the pain begin?" (e.g., minutes, hours or days ago)      5-6 days ago 4. SUDDEN: "Gradual or sudden onset?"     sudden 5. PATTERN "Does the pain come and go, or is it constant?"    - If constant: "Is it getting better, staying the same, or worsening?"      (Note: Constant means the pain never goes away completely; most serious pain is constant and it progresses)     - If intermittent: "How long does it last?" "Do you have pain now?"     (Note: Intermittent means the pain goes away completely between bouts)     constant 6. SEVERITY: "How bad is the pain?"  (e.g., Scale 1-10; mild, moderate, or severe)    - MILD (1-3): doesn't interfere with normal activities, abdomen soft and not tender to touch     - MODERATE (4-7): interferes with normal activities or awakens from sleep, abdomen tender to touch     - SEVERE (8-10): excruciating pain, doubled over, unable to do any normal activities       Doubled over 7. RECURRENT SYMPTOM: "Have you ever had this type of stomach pain before?" If Yes, ask: "When was the last time?" and "What happened that time?"      no 8. AGGRAVATING FACTORS: "Does anything seem to cause this pain?" (e.g., foods, stress, alcohol)     Food worsens and then she vomits. 9. CARDIAC SYMPTOMS: "Do you have any of the following symptoms: chest pain, difficulty breathing, sweating, nausea?"     vomiting 10. OTHER SYMPTOMS: "Do you have any other symptoms?" (e.g., back pain, diarrhea, fever, urination pain, vomiting)       Voiding very little urine, dribbles 11. PREGNANCY: "Is there any chance you are pregnant?" "When was your last menstrual period?"       na  Protocols used: Abdominal Pain - Upper-A-AH

## 2021-07-19 NOTE — Telephone Encounter (Signed)
Patient's medical insurance nurse along with the patient called for appointment with CP per Agent. Patient on the line c/o sharp upper abdominal pain with N/V for 5-6 days. Described as sharp constant pain she is doubled over with now. Unable to hold down sips of broth, voiding only dribbles at a time. Advised ER at this time. Patient agreed and will have someone drive her today.

## 2021-07-20 ENCOUNTER — Emergency Department: Payer: Medicare Other

## 2021-07-20 ENCOUNTER — Other Ambulatory Visit: Payer: Self-pay

## 2021-07-20 ENCOUNTER — Emergency Department
Admission: EM | Admit: 2021-07-20 | Discharge: 2021-07-20 | Disposition: A | Discharge status: Home / Self Care | Payer: Medicare Other | Attending: Emergency Medicine | Admitting: Emergency Medicine

## 2021-07-20 DIAGNOSIS — Z87891 Personal history of nicotine dependence: Secondary | ICD-10-CM | POA: Diagnosis not present

## 2021-07-20 DIAGNOSIS — Z79899 Other long term (current) drug therapy: Secondary | ICD-10-CM | POA: Insufficient documentation

## 2021-07-20 DIAGNOSIS — I2510 Atherosclerotic heart disease of native coronary artery without angina pectoris: Secondary | ICD-10-CM | POA: Insufficient documentation

## 2021-07-20 DIAGNOSIS — N1831 Chronic kidney disease, stage 3a: Secondary | ICD-10-CM | POA: Diagnosis not present

## 2021-07-20 DIAGNOSIS — E1122 Type 2 diabetes mellitus with diabetic chronic kidney disease: Secondary | ICD-10-CM | POA: Insufficient documentation

## 2021-07-20 DIAGNOSIS — Z8542 Personal history of malignant neoplasm of other parts of uterus: Secondary | ICD-10-CM | POA: Insufficient documentation

## 2021-07-20 DIAGNOSIS — Z955 Presence of coronary angioplasty implant and graft: Secondary | ICD-10-CM | POA: Diagnosis not present

## 2021-07-20 DIAGNOSIS — Z8616 Personal history of COVID-19: Secondary | ICD-10-CM | POA: Diagnosis not present

## 2021-07-20 DIAGNOSIS — R1110 Vomiting, unspecified: Secondary | ICD-10-CM | POA: Diagnosis not present

## 2021-07-20 DIAGNOSIS — I129 Hypertensive chronic kidney disease with stage 1 through stage 4 chronic kidney disease, or unspecified chronic kidney disease: Secondary | ICD-10-CM | POA: Insufficient documentation

## 2021-07-20 DIAGNOSIS — E039 Hypothyroidism, unspecified: Secondary | ICD-10-CM | POA: Insufficient documentation

## 2021-07-20 DIAGNOSIS — N3001 Acute cystitis with hematuria: Secondary | ICD-10-CM | POA: Insufficient documentation

## 2021-07-20 DIAGNOSIS — K219 Gastro-esophageal reflux disease without esophagitis: Secondary | ICD-10-CM | POA: Diagnosis not present

## 2021-07-20 DIAGNOSIS — I10 Essential (primary) hypertension: Secondary | ICD-10-CM | POA: Diagnosis not present

## 2021-07-20 DIAGNOSIS — Z794 Long term (current) use of insulin: Secondary | ICD-10-CM | POA: Diagnosis not present

## 2021-07-20 DIAGNOSIS — E1149 Type 2 diabetes mellitus with other diabetic neurological complication: Secondary | ICD-10-CM | POA: Diagnosis not present

## 2021-07-20 DIAGNOSIS — Z91040 Latex allergy status: Secondary | ICD-10-CM | POA: Insufficient documentation

## 2021-07-20 DIAGNOSIS — R1013 Epigastric pain: Secondary | ICD-10-CM | POA: Diagnosis present

## 2021-07-20 DIAGNOSIS — Z7984 Long term (current) use of oral hypoglycemic drugs: Secondary | ICD-10-CM | POA: Diagnosis not present

## 2021-07-20 DIAGNOSIS — E1110 Type 2 diabetes mellitus with ketoacidosis without coma: Secondary | ICD-10-CM | POA: Diagnosis not present

## 2021-07-20 DIAGNOSIS — R079 Chest pain, unspecified: Secondary | ICD-10-CM | POA: Diagnosis not present

## 2021-07-20 LAB — URINALYSIS, COMPLETE (UACMP) WITH MICROSCOPIC
Bacteria, UA: NONE SEEN
Bilirubin Urine: NEGATIVE
Glucose, UA: NEGATIVE mg/dL
Ketones, ur: 20 mg/dL — AB
Nitrite: NEGATIVE
Protein, ur: 100 mg/dL — AB
RBC / HPF: >50 RBC/hpf — ABNORMAL HIGH (ref 0–5)
Specific Gravity, Urine: >1.046 — ABNORMAL HIGH (ref 1.005–1.030)
WBC, UA: >50 WBC/hpf — ABNORMAL HIGH (ref 0–5)
pH: 5 (ref 5.0–8.0)

## 2021-07-20 LAB — BASIC METABOLIC PANEL
Anion gap: 10 (ref 5–15)
BUN: 20 mg/dL (ref 8–23)
CO2: 19 mmol/L — ABNORMAL LOW (ref 22–32)
Calcium: 8.3 mg/dL — ABNORMAL LOW (ref 8.9–10.3)
Chloride: 110 mmol/L (ref 98–111)
Creatinine, Ser: 1.55 mg/dL — ABNORMAL HIGH (ref 0.44–1.00)
GFR, Estimated: 36 mL/min — ABNORMAL LOW (ref >60)
Glucose, Bld: 121 mg/dL — ABNORMAL HIGH (ref 70–99)
Potassium: 3.6 mmol/L (ref 3.5–5.1)
Sodium: 139 mmol/L (ref 135–145)

## 2021-07-20 LAB — CBC
HCT: 34.7 % — ABNORMAL LOW (ref 36.0–46.0)
Hemoglobin: 10.7 g/dL — ABNORMAL LOW (ref 12.0–15.0)
MCH: 26.1 pg (ref 26.0–34.0)
MCHC: 30.8 g/dL (ref 30.0–36.0)
MCV: 84.6 fL (ref 80.0–100.0)
Platelets: 167 10*3/uL (ref 150–400)
RBC: 4.1 MIL/uL (ref 3.87–5.11)
RDW: 20.8 % — ABNORMAL HIGH (ref 11.5–15.5)
WBC: 5.6 10*3/uL (ref 4.0–10.5)
nRBC: 0 % (ref 0.0–0.2)

## 2021-07-20 LAB — HEPATIC FUNCTION PANEL
ALT: 20 U/L (ref 0–44)
AST: 31 U/L (ref 15–41)
Albumin: 3.1 g/dL — ABNORMAL LOW (ref 3.5–5.0)
Alkaline Phosphatase: 83 U/L (ref 38–126)
Bilirubin, Direct: 0.1 mg/dL (ref 0.0–0.2)
Indirect Bilirubin: 0.8 mg/dL (ref 0.3–0.9)
Total Bilirubin: 0.9 mg/dL (ref 0.3–1.2)
Total Protein: 6.8 g/dL (ref 6.5–8.1)

## 2021-07-20 LAB — TROPONIN I (HIGH SENSITIVITY)
Troponin I (High Sensitivity): 12 ng/L (ref <18)
Troponin I (High Sensitivity): 10 ng/L (ref <18)

## 2021-07-20 LAB — LIPASE, BLOOD: Lipase: 30 U/L (ref 11–51)

## 2021-07-20 MED ORDER — SODIUM CHLORIDE 0.9 % IV BOLUS
500.0000 mL | Freq: Once | INTRAVENOUS | Status: AC
  Administered 2021-07-20: 500 mL via INTRAVENOUS

## 2021-07-20 MED ORDER — PANTOPRAZOLE SODIUM 40 MG IV SOLR
40.0000 mg | Freq: Once | INTRAVENOUS | Status: AC
  Administered 2021-07-20: 40 mg via INTRAVENOUS
  Filled 2021-07-20: qty 40

## 2021-07-20 MED ORDER — HYDROCODONE-ACETAMINOPHEN 5-325 MG PO TABS
1.0000 | ORAL_TABLET | Freq: Once | ORAL | Status: AC
  Administered 2021-07-20: 1 via ORAL
  Filled 2021-07-20: qty 1

## 2021-07-20 MED ORDER — IOHEXOL 300 MG/ML  SOLN
60.0000 mL | Freq: Once | INTRAMUSCULAR | Status: AC | PRN
  Administered 2021-07-20: 60 mL via INTRAVENOUS
  Filled 2021-07-20: qty 60

## 2021-07-20 MED ORDER — CEPHALEXIN 500 MG PO CAPS
500.0000 mg | ORAL_CAPSULE | Freq: Once | ORAL | Status: AC
  Administered 2021-07-20: 500 mg via ORAL
  Filled 2021-07-20: qty 1

## 2021-07-20 MED ORDER — CEPHALEXIN 500 MG PO CAPS: 500.0000 mg | ORAL_CAPSULE | Freq: Two times a day (BID) | ORAL | 0 refills | Status: AC

## 2021-07-20 NOTE — ED Notes (Signed)
Iv insertion attempted x2 without success.

## 2021-07-20 NOTE — ED Notes (Signed)
Pt given cab voucher for self and husband. [employer] cab service called. Cab service will call back once evening driver arrives. Pt wheeled to lobby with husband.

## 2021-07-20 NOTE — ED Notes (Signed)
Iv team in room

## 2021-07-20 NOTE — ED Notes (Signed)
Patient transported to CT 

## 2021-07-20 NOTE — ED Triage Notes (Signed)
Pt to ED for centralized cp x1 week. Denies shob. States pain keeps increasing.  +emesis when eating States has not ate in a week ]  Recently seen for same

## 2021-07-20 NOTE — ED Provider Notes (Addendum)
[facility] Emergency Department Provider Note   ____________________________________________    I have reviewed the triage vital signs and the nursing notes.   HISTORY  Chief Complaint Epigastric abdominal pain    HPI [name] is a 65 y.o. female who presents with complaints of abdominal pain.  Patient describes epigastric abdominal pain which is been ongoing for 2 to 3 days.  Patient reports the pain is sharp in nature does not radiate.  She denies fevers or chills.  Some nausea decreased p.o. intake.  Normal stools.  Has not take anything for this.  No sick contacts    Past Medical History:  Diagnosis Date   Acute encephalopathy 05/22/2018   Allergy    Anginal pain (HCC)    Anxiety    Ascites    C. difficile colitis 07/10/2015   Cancer ([place])    HX OF CANCER OF UTERUS    Chronic kidney disease    States she only has 1 working kidney   Cirrhosis of liver not due to alcohol ([place]) 2016   Degenerative disk disease    Diverticulitis    Gastroparesis    GERD (gastroesophageal reflux disease)    History of hiatal hernia    Hypertension    Hypothyroid    Hypothyroidism due to amiodarone    Ileus ([place]) 08/01/2015   Intussusception intestine ([place]) 05/2015   Orthostatic hypotension    PAF (paroxysmal atrial fibrillation) ([place]) 03/2015   a. new onset 03/2015 in setting of intractable N/V; b. on Eliquis 5 mg bid; c. CHADSVASc 4 (DM, TIA x 2, female)   Pancreatitis    Pneumonia 11/14/2015   Pneumonia 07/2020   Right ureteral stone 07/14/2016   Sepsis ([place]) 12/15/2017   Sick sinus syndrome ([place])    Stomach ulcer    Stroke ([place])    with minimal left sided weakness   Syncope 01/2015   Syncope due to orthostatic hypotension 05/18/2015   Tachyarrhythmia 01/10/2016   TIA (transient ischemic attack) 02/2015   Type 1 diabetes ([place])    on levemir   UTERINE CANCER, HX OF 03/27/2007   Qualifier: Diagnosis of  By: Maxie Better FNP, Rosalita Levan    UTI  (urinary tract infection) 05/22/2018    Patient Active Problem List   Diagnosis Date Noted   Acute cystitis with hematuria    Right-sided chest wall pain    Hypothermia 03/04/2021   Hypoglycemia associated with diabetes ([place]) 03/04/2021   Encephalopathy 02/11/2021   Cervicalgia 01/11/2021   Cervical facet syndrome 01/11/2021   Cervical facet arthropathy 01/11/2021   Lumbosacral facet arthropathy 01/11/2021   Recurrent falls while walking 01/11/2021   Chronic low back pain (1ry area of Pain) (Bilateral) w/o sciatica 01/11/2021   Acute encephalopathy 12/13/2020   History of substance use disorder 12/12/2020   Substance use disorder 12/12/2020   Hematuria 10/27/2020   Hemoptysis 10/27/2020   Weakness 09/28/2020   COVID-19 virus infection    Stroke ([place]) 09/27/2020   Hypoglycemia 08/27/2020   CKD (chronic kidney disease), stage III ([place]) 08/27/2020   Shortness of breath 07/16/2020   Candidal intertrigo 07/16/2020   Sepsis ([place]) 11/23/2019   Acute pyelonephritis 09/30/2019   Acute pain of left foot 08/18/2019   Type II diabetes mellitus with renal manifestations ([place]) 08/13/2019   Weakness of both lower extremities 07/31/2019   Altered mental status 07/14/2019   GAD (generalized anxiety disorder) 06/11/2019   Poor memory 06/11/2019   Constipation 05/25/2019   Acute medial  meniscus tear of left knee 05/25/2019   AKI (acute kidney injury) ([place]) 05/16/2019   Closed fracture of left distal fibula 05/16/2019   Sinus tachycardia 05/05/2019   Congestive dilated cardiomyopathy ([place]) 04/14/2019   CAD (coronary artery disease) 04/10/2019   Closed fracture of lateral malleolus 04/10/2019   Lumbar facet syndrome (Bilateral) (R>L) 03/09/2019   Long term (current) use of anticoagulants 03/09/2019   Unspecified cirrhosis of liver ([place]) 02/11/2019   Hypoalbuminemia 02/11/2019   Edema due to hypoalbuminemia 02/11/2019   Lower extremity edema 01/26/2019   Prolonged QT interval 12/17/2018    Hypomagnesemia 12/17/2018   Type 2 diabetes mellitus with hyperglycemia ([place]) 12/17/2018   H/O TIA (transient ischemic attack) and stroke 12/17/2018   Personal history of surgery to heart and great vessels, presenting hazards to health 12/17/2018   DDD (degenerative disc disease), lumbar 12/17/2018   Lumbar intervertebral disc displacement 12/17/2018   Chronic musculoskeletal pain 12/17/2018   Chronic generalized pain 12/17/2018   Hyponatremia 12/17/2018   Fibromyalgia syndrome 12/17/2018   Other intervertebral disc displacement, lumbar region 12/17/2018   Vitamin D deficiency 11/26/2018   Elevated C-reactive protein (CRP) 11/26/2018   Elevated sed rate 11/26/2018   Chronic low back pain (1ry area of Pain) (Bilateral) (L>R) w/ sciatica (Bilateral) 11/25/2018   Chronic lower extremity pain (2ry area of Pain) (Bilateral) (L>R) 11/25/2018   Chronic neck pain 11/25/2018   Pharmacologic therapy 11/25/2018   Disorder of skeletal system 11/25/2018   Problems influencing health status 11/25/2018   Renal mass 10/06/2018   Chronic intermittent abdominal pain 09/25/2018   Unspecified abdominal pain 09/25/2018   Frequent falls 09/12/2018   Orthostatic hypotension 08/18/2018   Normochromic normocytic anemia 08/18/2018   Acute renal failure superimposed on stage 3a chronic kidney disease ([place]) 08/18/2018   History of hypothyroidism 08/14/2018   Medical non-compliance 08/14/2018   Diabetic ketoacidosis ([place]) 05/22/2018   Acute lower UTI 05/22/2018   Diarrhea    NSTEMI (non-ST elevated myocardial infarction) ([place]) 01/11/2016   Tachy-brady syndrome ([place]) 12/28/2015   PAF (paroxysmal atrial fibrillation) ([place]) 11/24/2015   Type 2 diabetes mellitus with neurologic complication ([place]) 52/84/1324   S/P cholecystectomy 11/23/2015   Narcotic abuse ([place]) 11/22/2015   Chronic superficial gastritis without bleeding 11/22/2015   History of Clostridium difficile 11/22/2015   History of TIA (transient  ischemic attack) 11/22/2015   Mixed hyperlipidemia 11/22/2015   Diverticulosis 11/22/2015   History of uterine cancer 11/22/2015   Hypothyroidism, unspecified 11/22/2015   Intractable cyclical vomiting with nausea 11/22/2015   Community acquired pneumonia of left lower lobe of lung 11/14/2015   Narcotic withdrawal ([place]) 11/11/2015   Hyperlipidemia 08/31/2015   Hypotension 08/06/2015   Cryptogenic cirrhosis ([place]) 07/10/2015   Atrial fibrillation ([place]) 07/10/2015   Malnutrition of moderate degree 07/04/2015   Symptomatic bradycardia 05/27/2015   Chronic anemia 05/19/2015   Thrombocytopenia ([place]) 05/19/2015   Hypokalemia 04/06/2015   Hyperlipidemia with target LDL less than 100 04/06/2015   Syncope 03/11/2015   CVA (cerebral vascular accident) ([place]) 02/15/2015   Essential hypertension 01/12/2015   Chronically on opiate therapy 01/12/2015   Cirrhosis of liver not due to alcohol ([place]) 2016   S/P Nissen fundoplication (without gastrostomy tube) procedure 05/11/2014   Dysphagia 04/19/2014   Myofascial pain syndrome 06/27/2007   Chronic pain syndrome 03/28/2007   GERD 03/27/2007   Diverticulosis of large intestine without perforation or abscess without bleeding 03/27/2007   Proteinuria 03/27/2007    Past Surgical History:  Procedure Laterality Date   ABDOMINAL  HYSTERECTOMY     CARDIAC CATHETERIZATION N/A 01/12/2016   Procedure: Left Heart Cath and Coronary Angiography;  Surgeon: Wellington Hampshire, MD;  Location: [facility];  Service: Cardiovascular;  Laterality: N/A;   CHOLECYSTECTOMY     CYSTOSCOPY W/ RETROGRADES Bilateral 12/11/2019   Procedure: CYSTOSCOPY WITH RETROGRADE PYELOGRAM;  Surgeon: Billey Co, MD;  Location: [facility];  Service: Urology;  Laterality: Bilateral;   CYSTOSCOPY WITH BIOPSY N/A 12/11/2019   Procedure: CYSTOSCOPY WITH BLADDER BIOPSY;  Surgeon: Billey Co, MD;  Location: [facility];  Service: Urology;  Laterality: N/A;   CYSTOSCOPY WITH FULGERATION  N/A 12/11/2019   Procedure: CYSTOSCOPY WITH FULGERATION;  Surgeon: Billey Co, MD;  Location: [facility];  Service: Urology;  Laterality: N/A;   CYSTOSCOPY WITH STENT PLACEMENT Right 12/11/2019   Procedure: CYSTOSCOPY WITH STENT PLACEMENT;  Surgeon: Billey Co, MD;  Location: [facility];  Service: Urology;  Laterality: Right;   CYSTOSCOPY WITH URETHRAL DILATATION Right 12/11/2019   Procedure: CYSTOSCOPY WITH URETERAL DILATATION;  Surgeon: Billey Co, MD;  Location: [facility];  Service: Urology;  Laterality: Right;   CYSTOSCOPY/URETEROSCOPY/HOLMIUM LASER Right 07/14/2016   Procedure: CYSTOSCOPY/URETEROSCOPY/HOLMIUM LASER;  Surgeon: Alexis Frock, MD;  Location: [facility];  Service: Urology;  Laterality: Right;   ESOPHAGOGASTRODUODENOSCOPY N/A 04/04/2015   Procedure: ESOPHAGOGASTRODUODENOSCOPY (EGD);  Surgeon: Hulen Luster, MD;  Location: [facility];  Service: Endoscopy;  Laterality: N/A;   ESOPHAGOGASTRODUODENOSCOPY N/A 12/28/2017   Procedure: ESOPHAGOGASTRODUODENOSCOPY (EGD);  Surgeon: Lin Landsman, MD;  Location: [facility] ENDOSCOPY;  Service: Gastroenterology;  Laterality: N/A;   ESOPHAGOGASTRODUODENOSCOPY (EGD) WITH PROPOFOL N/A 01/18/2016   Procedure: ESOPHAGOGASTRODUODENOSCOPY (EGD) WITH PROPOFOL;  Surgeon: Lucilla Lame, MD;  Location: [facility]OSCOPY;  Service: Endoscopy;  Laterality: N/A;   FLEXIBLE SIGMOIDOSCOPY N/A 01/18/2016   Procedure: FLEXIBLE SIGMOIDOSCOPY;  Surgeon: Lucilla Lame, MD;  Location: [facility] ENDOSCOPY;  Service: Endoscopy;  Laterality: N/A;   HERNIA REPAIR     URETEROSCOPY Right 12/11/2019   Procedure: Christen Butter;  Surgeon: Billey Co, MD;  Location: [facility];  Service: Urology;  Laterality: Right;    Prior to Admission medications   Medication Sig Start Date End Date Taking? Authorizing Provider  cephALEXin (KEFLEX) 500 MG capsule Take 1 capsule (500 mg total) by mouth 2 (two) times daily. 07/20/21  Yes Lavonia Drafts, MD  carvedilol (COREG) 3.125 MG  tablet Take 1 tablet (3.125 mg total) by mouth 2 (two) times daily with a meal. For blood pressure. 06/14/21   [name], NP  chlorpheniramine-HYDROcodone (TUSSIONEX PENNKINETIC ER) 10-8 MG/5ML SUER Take 5 mLs by mouth every 12 (twelve) hours as needed for cough. 07/05/21   Hinda Kehr, MD  glipiZIDE (GLIPIZIDE XL) 5 MG 24 hr tablet Take 1 tablet (5 mg total) by mouth daily with breakfast. For diabetes. 06/14/21   [name], NP  Insulin Syringe-Needle U-100 25G X 1" 1 ML MISC For 4 times a day insulin SQ, 1 month supply. Diagnosis E11.65 Patient not taking: Reported on 06/14/2021 02/15/21   Thurnell Lose, MD  lidocaine (LIDODERM) 5 % Place 1 patch onto the skin daily. Remove & Discard patch within 12 hours or as directed by MD 03/08/21   Eugenie Filler, MD  Melatonin 10 MG CAPS Take 1 tablet by mouth at bedtime as needed (sleep).    [provider]  sertraline (ZOLOFT) 50 MG tablet Take 1 tablet (50 mg total) by mouth daily. For anxiety and depression. Patient not taking: Reported on 06/14/2021  10/27/20   [name], NP  vitamin B-12 (CYANOCOBALAMIN) 1000 MCG tablet Take 1 tablet (1,000 mcg total) by mouth daily. Patient not taking: Reported on 06/14/2021 02/16/21   Thurnell Lose, MD     Allergies Latex, Aspirin, Codeine sulfate, Erythromycin, Prednisone, Rosiglitazone maleate, and Tetanus-diphtheria toxoids td  Family History  Problem Relation Age of Onset   Hypertension Mother    CAD Sister    Heart attack Sister        Deceased 2014-11-20   CAD Brother     Social History Social History   Tobacco Use   Smoking status: Former    Types: Cigarettes   Smokeless tobacco: Never   Tobacco comments:    25 years ago and only smoked occasionally  Vaping Use   Vaping Use: Never used  Substance Use Topics   Alcohol use: No   Drug use: No    Review of Systems  Constitutional: No fever/chills Eyes: No visual changes.  ENT: No sore  throat. Cardiovascular: Denies chest pain. Respiratory: Denies shortness of breath. Gastrointestinal: As above Genitourinary: Negative for dysuria. Musculoskeletal: Negative for back pain. Skin: Negative for rash. Neurological: Negative for headaches    ____________________________________________   PHYSICAL EXAM:  VITAL SIGNS: ED Triage Vitals  Enc Vitals Group     BP 07/20/21 1144 113/84     Pulse Rate 07/20/21 1144 (!) 103     Resp 07/20/21 1144 (!) 22     Temp 07/20/21 1144 98.4 F (36.9 C)     Temp Source 07/20/21 1144 Oral     SpO2 07/20/21 1144 100 %     Weight 07/20/21 1142 64 kg (141 lb 1.5 oz)     Height 07/20/21 1142 1.6 m (5' 3" )     Head Circumference --      Peak Flow --      Pain Score 07/20/21 1142 8     Pain Loc --      Pain Edu? --      Excl. in [place]? --     Constitutional: Alert and oriented.  Eyes: Conjunctivae are normal.   Nose: No congestion/rhinnorhea. Mouth/Throat: Mucous membranes are moist.    Cardiovascular: Normal rate, regular rhythm. Grossly normal heart sounds.  Good peripheral circulation. Respiratory: Normal respiratory effort.  No retractions. Lungs CTAB. Gastrointestinal: Soft, tenderness to palpation epigastrically.  No distention.  No CVA tenderness.  Musculoskeletal:  Warm and well perfused Neurologic:  Normal speech and language. No gross focal neurologic deficits are appreciated.  Skin:  Skin is warm, dry and intact. No rash noted. Psychiatric: Mood and affect are normal. Speech and behavior are normal.  ____________________________________________   LABS (all labs ordered are listed, but only abnormal results are displayed)  Labs Reviewed  BASIC METABOLIC PANEL - Abnormal; Notable for the following components:      Result Value   CO2 19 (*)    Glucose, Bld 121 (*)    Creatinine, Ser 1.55 (*)    Calcium 8.3 (*)    GFR, Estimated 36 (*)    All other components within normal limits  CBC - Abnormal; Notable for the  following components:   Hemoglobin 10.7 (*)    HCT 34.7 (*)    RDW 20.8 (*)    All other components within normal limits  HEPATIC FUNCTION PANEL - Abnormal; Notable for the following components:   Albumin 3.1 (*)    All other components within normal limits  URINALYSIS, COMPLETE (UACMP) WITH MICROSCOPIC -  Abnormal; Notable for the following components:   Color, Urine YELLOW (*)    APPearance CLOUDY (*)    Specific Gravity, Urine >1.046 (*)    Hgb urine dipstick LARGE (*)    Ketones, ur 20 (*)    Protein, ur 100 (*)    Leukocytes,Ua LARGE (*)    RBC / HPF >50 (*)    WBC, UA >50 (*)    All other components within normal limits  URINE CULTURE  LIPASE, BLOOD  TROPONIN I (HIGH SENSITIVITY)  TROPONIN I (HIGH SENSITIVITY)   ____________________________________________  EKG  ED ECG REPORT I, Lavonia Drafts, the attending physician, personally viewed and interpreted this ECG.  Date: 07/20/2021  Rhythm: normal sinus rhythm QRS Axis: normal Intervals: normal ST/T Wave abnormalities: Nonspecific changes Narrative Interpretation: no evidence of acute ischemia  ____________________________________________  RADIOLOGY  Chest x-ray viewed by me, no acute abnormality ____________________________________________   PROCEDURES  Procedure(s) performed: No  Procedures   Critical Care performed: No ____________________________________________   INITIAL IMPRESSION / ASSESSMENT AND PLAN / ED COURSE  Pertinent labs & imaging results that were available during my care of the patient were reviewed by me and considered in my medical decision making (see chart for details).   Patient presents with epigastric [place] pain as detailed above, differential includes gastritis, pancreatitis, less likely cholecystitis/cholelithiasis.  Lab work notable for normal white blood cell count.  EKG and high-sensitivity opponent are reassuring, not consistent with ACS.  Will check LFTs, lipase,  obtain CT abdomen pelvis  CT scan overall reassuring and not significantly changed from prior, her urinalysis is consistent with urinary tract infection, will start the patient on Keflex, send urine culture, outpatient follow-up strongly recommended.  Patient agrees with this plan.    ____________________________________________   FINAL CLINICAL IMPRESSION(S) / ED DIAGNOSES  Final diagnoses:  Acute cystitis with hematuria        Note:  This document was prepared using Dragon voice recognition software and may include unintentional dictation errors.    Lavonia Drafts, MD 07/20/21 1409    Lavonia Drafts, MD 07/20/21 1754

## 2021-07-20 NOTE — ED Notes (Signed)
Pt ambulated to bathroom, gait steady

## 2021-07-20 NOTE — ED Notes (Signed)
Pt states she is unable to urinate at this time.

## 2021-07-21 LAB — URINE CULTURE: Culture: <10000 — AB

## 2021-08-03 DIAGNOSIS — K529 Noninfective gastroenteritis and colitis, unspecified: Secondary | ICD-10-CM | POA: Diagnosis not present

## 2021-08-03 DIAGNOSIS — E785 Hyperlipidemia, unspecified: Secondary | ICD-10-CM | POA: Diagnosis not present

## 2021-08-03 DIAGNOSIS — M79601 Pain in right arm: Secondary | ICD-10-CM | POA: Diagnosis not present

## 2021-08-03 DIAGNOSIS — I340 Nonrheumatic mitral (valve) insufficiency: Secondary | ICD-10-CM | POA: Diagnosis not present

## 2021-08-03 DIAGNOSIS — R0789 Other chest pain: Secondary | ICD-10-CM | POA: Diagnosis not present

## 2021-08-03 DIAGNOSIS — D72819 Decreased white blood cell count, unspecified: Secondary | ICD-10-CM | POA: Diagnosis not present

## 2021-08-03 DIAGNOSIS — I864 Gastric varices: Secondary | ICD-10-CM | POA: Diagnosis not present

## 2021-08-03 DIAGNOSIS — R079 Chest pain, unspecified: Secondary | ICD-10-CM | POA: Diagnosis not present

## 2021-08-03 DIAGNOSIS — K921 Melena: Secondary | ICD-10-CM | POA: Diagnosis not present

## 2021-08-03 DIAGNOSIS — K7460 Unspecified cirrhosis of liver: Secondary | ICD-10-CM | POA: Diagnosis not present

## 2021-08-03 DIAGNOSIS — M25512 Pain in left shoulder: Secondary | ICD-10-CM | POA: Diagnosis not present

## 2021-08-03 DIAGNOSIS — K3184 Gastroparesis: Secondary | ICD-10-CM | POA: Diagnosis not present

## 2021-08-03 DIAGNOSIS — D72829 Elevated white blood cell count, unspecified: Secondary | ICD-10-CM | POA: Diagnosis not present

## 2021-08-03 DIAGNOSIS — I480 Paroxysmal atrial fibrillation: Secondary | ICD-10-CM | POA: Diagnosis not present

## 2021-08-03 DIAGNOSIS — K2980 Duodenitis without bleeding: Secondary | ICD-10-CM | POA: Diagnosis not present

## 2021-08-03 DIAGNOSIS — I252 Old myocardial infarction: Secondary | ICD-10-CM | POA: Diagnosis not present

## 2021-08-03 DIAGNOSIS — E1022 Type 1 diabetes mellitus with diabetic chronic kidney disease: Secondary | ICD-10-CM | POA: Diagnosis not present

## 2021-08-03 DIAGNOSIS — K259 Gastric ulcer, unspecified as acute or chronic, without hemorrhage or perforation: Secondary | ICD-10-CM | POA: Diagnosis not present

## 2021-08-03 DIAGNOSIS — K2950 Unspecified chronic gastritis without bleeding: Secondary | ICD-10-CM | POA: Diagnosis not present

## 2021-08-03 DIAGNOSIS — B952 Enterococcus as the cause of diseases classified elsewhere: Secondary | ICD-10-CM | POA: Diagnosis not present

## 2021-08-03 DIAGNOSIS — K5900 Constipation, unspecified: Secondary | ICD-10-CM | POA: Diagnosis not present

## 2021-08-03 DIAGNOSIS — N390 Urinary tract infection, site not specified: Secondary | ICD-10-CM | POA: Diagnosis not present

## 2021-08-03 DIAGNOSIS — E1122 Type 2 diabetes mellitus with diabetic chronic kidney disease: Secondary | ICD-10-CM | POA: Diagnosis not present

## 2021-08-03 DIAGNOSIS — D696 Thrombocytopenia, unspecified: Secondary | ICD-10-CM | POA: Diagnosis not present

## 2021-08-03 DIAGNOSIS — K3189 Other diseases of stomach and duodenum: Secondary | ICD-10-CM | POA: Diagnosis not present

## 2021-08-03 DIAGNOSIS — K861 Other chronic pancreatitis: Secondary | ICD-10-CM | POA: Diagnosis not present

## 2021-08-03 DIAGNOSIS — I8510 Secondary esophageal varices without bleeding: Secondary | ICD-10-CM | POA: Diagnosis not present

## 2021-08-03 DIAGNOSIS — K279 Peptic ulcer, site unspecified, unspecified as acute or chronic, without hemorrhage or perforation: Secondary | ICD-10-CM | POA: Diagnosis not present

## 2021-08-03 DIAGNOSIS — Z79899 Other long term (current) drug therapy: Secondary | ICD-10-CM | POA: Diagnosis not present

## 2021-08-03 DIAGNOSIS — Z794 Long term (current) use of insulin: Secondary | ICD-10-CM | POA: Diagnosis not present

## 2021-08-03 DIAGNOSIS — R0602 Shortness of breath: Secondary | ICD-10-CM | POA: Diagnosis not present

## 2021-08-03 DIAGNOSIS — K766 Portal hypertension: Secondary | ICD-10-CM | POA: Diagnosis not present

## 2021-08-03 DIAGNOSIS — Z791 Long term (current) use of non-steroidal anti-inflammatories (NSAID): Secondary | ICD-10-CM | POA: Diagnosis not present

## 2021-08-03 DIAGNOSIS — R1013 Epigastric pain: Secondary | ICD-10-CM | POA: Diagnosis not present

## 2021-08-03 DIAGNOSIS — N183 Chronic kidney disease, stage 3 unspecified: Secondary | ICD-10-CM | POA: Diagnosis not present

## 2021-08-03 DIAGNOSIS — N3090 Cystitis, unspecified without hematuria: Secondary | ICD-10-CM | POA: Diagnosis not present

## 2021-08-03 DIAGNOSIS — Z885 Allergy status to narcotic agent status: Secondary | ICD-10-CM | POA: Diagnosis not present

## 2021-08-03 DIAGNOSIS — K7581 Nonalcoholic steatohepatitis (NASH): Secondary | ICD-10-CM | POA: Diagnosis not present

## 2021-08-03 DIAGNOSIS — E1043 Type 1 diabetes mellitus with diabetic autonomic (poly)neuropathy: Secondary | ICD-10-CM | POA: Diagnosis not present

## 2021-08-03 DIAGNOSIS — K7469 Other cirrhosis of liver: Secondary | ICD-10-CM | POA: Diagnosis not present

## 2021-08-03 DIAGNOSIS — I129 Hypertensive chronic kidney disease with stage 1 through stage 4 chronic kidney disease, or unspecified chronic kidney disease: Secondary | ICD-10-CM | POA: Diagnosis not present

## 2021-08-03 DIAGNOSIS — D6959 Other secondary thrombocytopenia: Secondary | ICD-10-CM | POA: Diagnosis not present

## 2021-08-04 DIAGNOSIS — I340 Nonrheumatic mitral (valve) insufficiency: Secondary | ICD-10-CM | POA: Diagnosis not present

## 2021-08-06 DIAGNOSIS — I480 Paroxysmal atrial fibrillation: Secondary | ICD-10-CM | POA: Diagnosis not present

## 2021-08-06 DIAGNOSIS — I8510 Secondary esophageal varices without bleeding: Secondary | ICD-10-CM | POA: Diagnosis not present

## 2021-08-06 DIAGNOSIS — K259 Gastric ulcer, unspecified as acute or chronic, without hemorrhage or perforation: Secondary | ICD-10-CM | POA: Diagnosis not present

## 2021-08-06 DIAGNOSIS — K2980 Duodenitis without bleeding: Secondary | ICD-10-CM | POA: Diagnosis not present

## 2021-08-21 ENCOUNTER — Ambulatory Visit: Payer: Medicare Other | Admitting: Internal Medicine

## 2021-09-11 DIAGNOSIS — I10 Essential (primary) hypertension: Secondary | ICD-10-CM | POA: Diagnosis not present

## 2021-09-11 DIAGNOSIS — Z885 Allergy status to narcotic agent status: Secondary | ICD-10-CM | POA: Diagnosis not present

## 2021-09-11 DIAGNOSIS — K920 Hematemesis: Secondary | ICD-10-CM | POA: Diagnosis not present

## 2021-09-11 DIAGNOSIS — K3184 Gastroparesis: Secondary | ICD-10-CM | POA: Diagnosis not present

## 2021-09-11 DIAGNOSIS — Z7901 Long term (current) use of anticoagulants: Secondary | ICD-10-CM | POA: Diagnosis not present

## 2021-09-11 DIAGNOSIS — M7989 Other specified soft tissue disorders: Secondary | ICD-10-CM | POA: Diagnosis not present

## 2021-09-11 DIAGNOSIS — N3000 Acute cystitis without hematuria: Secondary | ICD-10-CM | POA: Diagnosis not present

## 2021-09-11 DIAGNOSIS — R1084 Generalized abdominal pain: Secondary | ICD-10-CM | POA: Diagnosis not present

## 2021-09-11 DIAGNOSIS — K7469 Other cirrhosis of liver: Secondary | ICD-10-CM | POA: Diagnosis not present

## 2021-09-11 DIAGNOSIS — I480 Paroxysmal atrial fibrillation: Secondary | ICD-10-CM | POA: Diagnosis not present

## 2021-09-11 DIAGNOSIS — M79609 Pain in unspecified limb: Secondary | ICD-10-CM | POA: Diagnosis not present

## 2021-09-11 DIAGNOSIS — K766 Portal hypertension: Secondary | ICD-10-CM | POA: Diagnosis not present

## 2021-09-11 DIAGNOSIS — I8500 Esophageal varices without bleeding: Secondary | ICD-10-CM | POA: Diagnosis not present

## 2021-09-11 DIAGNOSIS — N179 Acute kidney failure, unspecified: Secondary | ICD-10-CM | POA: Diagnosis not present

## 2021-09-11 DIAGNOSIS — Z79899 Other long term (current) drug therapy: Secondary | ICD-10-CM | POA: Diagnosis not present

## 2021-09-11 DIAGNOSIS — K7581 Nonalcoholic steatohepatitis (NASH): Secondary | ICD-10-CM | POA: Diagnosis not present

## 2021-09-11 DIAGNOSIS — Z20822 Contact with and (suspected) exposure to covid-19: Secondary | ICD-10-CM | POA: Diagnosis not present

## 2021-09-11 DIAGNOSIS — Z8673 Personal history of transient ischemic attack (TIA), and cerebral infarction without residual deficits: Secondary | ICD-10-CM | POA: Diagnosis not present

## 2021-09-11 DIAGNOSIS — N390 Urinary tract infection, site not specified: Secondary | ICD-10-CM | POA: Diagnosis not present

## 2021-09-11 DIAGNOSIS — K922 Gastrointestinal hemorrhage, unspecified: Secondary | ICD-10-CM | POA: Diagnosis not present

## 2021-09-11 DIAGNOSIS — E1143 Type 2 diabetes mellitus with diabetic autonomic (poly)neuropathy: Secondary | ICD-10-CM | POA: Diagnosis not present

## 2021-09-11 DIAGNOSIS — I8511 Secondary esophageal varices with bleeding: Secondary | ICD-10-CM | POA: Diagnosis not present

## 2021-09-11 DIAGNOSIS — I252 Old myocardial infarction: Secondary | ICD-10-CM | POA: Diagnosis not present

## 2021-09-11 DIAGNOSIS — K7460 Unspecified cirrhosis of liver: Secondary | ICD-10-CM | POA: Diagnosis not present

## 2021-09-11 DIAGNOSIS — E1149 Type 2 diabetes mellitus with other diabetic neurological complication: Secondary | ICD-10-CM | POA: Diagnosis not present

## 2021-09-11 DIAGNOSIS — R109 Unspecified abdominal pain: Secondary | ICD-10-CM | POA: Diagnosis not present

## 2021-09-11 DIAGNOSIS — E1142 Type 2 diabetes mellitus with diabetic polyneuropathy: Secondary | ICD-10-CM | POA: Diagnosis not present

## 2021-09-11 DIAGNOSIS — E039 Hypothyroidism, unspecified: Secondary | ICD-10-CM | POA: Diagnosis not present

## 2021-09-11 DIAGNOSIS — Z887 Allergy status to serum and vaccine status: Secondary | ICD-10-CM | POA: Diagnosis not present

## 2021-09-11 DIAGNOSIS — Z794 Long term (current) use of insulin: Secondary | ICD-10-CM | POA: Diagnosis not present

## 2021-09-11 DIAGNOSIS — Z91199 Patient's noncompliance with other medical treatment and regimen due to unspecified reason: Secondary | ICD-10-CM | POA: Diagnosis not present

## 2021-09-11 DIAGNOSIS — K3189 Other diseases of stomach and duodenum: Secondary | ICD-10-CM | POA: Diagnosis not present

## 2021-09-11 DIAGNOSIS — K279 Peptic ulcer, site unspecified, unspecified as acute or chronic, without hemorrhage or perforation: Secondary | ICD-10-CM | POA: Diagnosis not present

## 2021-09-11 DIAGNOSIS — Z9114 Patient's other noncompliance with medication regimen: Secondary | ICD-10-CM | POA: Diagnosis not present

## 2021-09-12 DIAGNOSIS — K3184 Gastroparesis: Secondary | ICD-10-CM | POA: Diagnosis not present

## 2021-09-12 DIAGNOSIS — E039 Hypothyroidism, unspecified: Secondary | ICD-10-CM | POA: Diagnosis not present

## 2021-09-12 DIAGNOSIS — I10 Essential (primary) hypertension: Secondary | ICD-10-CM | POA: Diagnosis not present

## 2021-09-12 DIAGNOSIS — K922 Gastrointestinal hemorrhage, unspecified: Secondary | ICD-10-CM | POA: Diagnosis not present

## 2021-09-12 DIAGNOSIS — K920 Hematemesis: Secondary | ICD-10-CM | POA: Diagnosis not present

## 2021-09-12 DIAGNOSIS — K7581 Nonalcoholic steatohepatitis (NASH): Secondary | ICD-10-CM | POA: Diagnosis not present

## 2021-09-12 DIAGNOSIS — K279 Peptic ulcer, site unspecified, unspecified as acute or chronic, without hemorrhage or perforation: Secondary | ICD-10-CM | POA: Diagnosis not present

## 2021-09-12 DIAGNOSIS — I480 Paroxysmal atrial fibrillation: Secondary | ICD-10-CM | POA: Diagnosis not present

## 2021-09-12 DIAGNOSIS — N3000 Acute cystitis without hematuria: Secondary | ICD-10-CM | POA: Diagnosis not present

## 2021-09-12 DIAGNOSIS — I8500 Esophageal varices without bleeding: Secondary | ICD-10-CM | POA: Diagnosis not present

## 2021-09-12 DIAGNOSIS — N179 Acute kidney failure, unspecified: Secondary | ICD-10-CM | POA: Diagnosis not present

## 2021-09-12 DIAGNOSIS — E1143 Type 2 diabetes mellitus with diabetic autonomic (poly)neuropathy: Secondary | ICD-10-CM | POA: Diagnosis not present

## 2021-09-12 DIAGNOSIS — K7460 Unspecified cirrhosis of liver: Secondary | ICD-10-CM | POA: Diagnosis not present

## 2021-09-13 DIAGNOSIS — K920 Hematemesis: Secondary | ICD-10-CM | POA: Diagnosis not present

## 2021-09-13 DIAGNOSIS — N179 Acute kidney failure, unspecified: Secondary | ICD-10-CM | POA: Diagnosis not present

## 2021-09-13 DIAGNOSIS — E1143 Type 2 diabetes mellitus with diabetic autonomic (poly)neuropathy: Secondary | ICD-10-CM | POA: Diagnosis not present

## 2021-09-13 DIAGNOSIS — N3000 Acute cystitis without hematuria: Secondary | ICD-10-CM | POA: Diagnosis not present

## 2021-09-13 DIAGNOSIS — K3184 Gastroparesis: Secondary | ICD-10-CM | POA: Diagnosis not present

## 2021-09-13 DIAGNOSIS — I10 Essential (primary) hypertension: Secondary | ICD-10-CM | POA: Diagnosis not present

## 2021-09-14 DIAGNOSIS — K920 Hematemesis: Secondary | ICD-10-CM | POA: Diagnosis not present

## 2021-09-14 DIAGNOSIS — E1143 Type 2 diabetes mellitus with diabetic autonomic (poly)neuropathy: Secondary | ICD-10-CM | POA: Diagnosis not present

## 2021-09-14 DIAGNOSIS — I10 Essential (primary) hypertension: Secondary | ICD-10-CM | POA: Diagnosis not present

## 2021-09-14 DIAGNOSIS — K3184 Gastroparesis: Secondary | ICD-10-CM | POA: Diagnosis not present

## 2021-09-14 DIAGNOSIS — N179 Acute kidney failure, unspecified: Secondary | ICD-10-CM | POA: Diagnosis not present

## 2021-09-14 DIAGNOSIS — N3000 Acute cystitis without hematuria: Secondary | ICD-10-CM | POA: Diagnosis not present

## 2021-09-15 DIAGNOSIS — K3184 Gastroparesis: Secondary | ICD-10-CM | POA: Diagnosis not present

## 2021-09-15 DIAGNOSIS — N179 Acute kidney failure, unspecified: Secondary | ICD-10-CM | POA: Diagnosis not present

## 2021-09-15 DIAGNOSIS — N3000 Acute cystitis without hematuria: Secondary | ICD-10-CM | POA: Diagnosis not present

## 2021-09-15 DIAGNOSIS — E1143 Type 2 diabetes mellitus with diabetic autonomic (poly)neuropathy: Secondary | ICD-10-CM | POA: Diagnosis not present

## 2021-09-15 DIAGNOSIS — I10 Essential (primary) hypertension: Secondary | ICD-10-CM | POA: Diagnosis not present

## 2021-09-15 DIAGNOSIS — K920 Hematemesis: Secondary | ICD-10-CM | POA: Diagnosis not present

## 2021-10-04 DIAGNOSIS — R109 Unspecified abdominal pain: Secondary | ICD-10-CM | POA: Diagnosis not present

## 2021-10-04 DIAGNOSIS — K3184 Gastroparesis: Secondary | ICD-10-CM | POA: Diagnosis not present

## 2021-10-04 DIAGNOSIS — E1049 Type 1 diabetes mellitus with other diabetic neurological complication: Secondary | ICD-10-CM | POA: Diagnosis not present

## 2021-10-04 DIAGNOSIS — R634 Abnormal weight loss: Secondary | ICD-10-CM | POA: Diagnosis not present

## 2021-10-04 DIAGNOSIS — K7460 Unspecified cirrhosis of liver: Secondary | ICD-10-CM | POA: Diagnosis not present

## 2021-10-04 DIAGNOSIS — I10 Essential (primary) hypertension: Secondary | ICD-10-CM | POA: Diagnosis not present

## 2021-10-04 DIAGNOSIS — K7469 Other cirrhosis of liver: Secondary | ICD-10-CM | POA: Diagnosis not present

## 2021-10-04 DIAGNOSIS — I8511 Secondary esophageal varices with bleeding: Secondary | ICD-10-CM | POA: Diagnosis not present

## 2021-10-04 DIAGNOSIS — I8501 Esophageal varices with bleeding: Secondary | ICD-10-CM | POA: Diagnosis not present

## 2021-10-04 DIAGNOSIS — I2510 Atherosclerotic heart disease of native coronary artery without angina pectoris: Secondary | ICD-10-CM | POA: Diagnosis not present

## 2021-10-04 DIAGNOSIS — E118 Type 2 diabetes mellitus with unspecified complications: Secondary | ICD-10-CM | POA: Diagnosis not present

## 2021-10-04 DIAGNOSIS — R1110 Vomiting, unspecified: Secondary | ICD-10-CM | POA: Diagnosis not present

## 2021-10-04 DIAGNOSIS — N3000 Acute cystitis without hematuria: Secondary | ICD-10-CM | POA: Diagnosis not present

## 2021-10-04 DIAGNOSIS — K649 Unspecified hemorrhoids: Secondary | ICD-10-CM | POA: Diagnosis not present

## 2021-10-04 DIAGNOSIS — E038 Other specified hypothyroidism: Secondary | ICD-10-CM | POA: Diagnosis not present

## 2021-10-04 DIAGNOSIS — E860 Dehydration: Secondary | ICD-10-CM | POA: Diagnosis not present

## 2021-10-04 DIAGNOSIS — T447X6A Underdosing of beta-adrenoreceptor antagonists, initial encounter: Secondary | ICD-10-CM | POA: Diagnosis not present

## 2021-10-04 DIAGNOSIS — Z91128 Patient's intentional underdosing of medication regimen for other reason: Secondary | ICD-10-CM | POA: Diagnosis not present

## 2021-10-04 DIAGNOSIS — T383X6A Underdosing of insulin and oral hypoglycemic [antidiabetic] drugs, initial encounter: Secondary | ICD-10-CM | POA: Diagnosis not present

## 2021-10-04 DIAGNOSIS — D61818 Other pancytopenia: Secondary | ICD-10-CM | POA: Diagnosis not present

## 2021-10-04 DIAGNOSIS — I480 Paroxysmal atrial fibrillation: Secondary | ICD-10-CM | POA: Diagnosis not present

## 2021-10-04 DIAGNOSIS — E1065 Type 1 diabetes mellitus with hyperglycemia: Secondary | ICD-10-CM | POA: Diagnosis not present

## 2021-10-04 DIAGNOSIS — E1043 Type 1 diabetes mellitus with diabetic autonomic (poly)neuropathy: Secondary | ICD-10-CM | POA: Diagnosis not present

## 2021-10-04 DIAGNOSIS — R0789 Other chest pain: Secondary | ICD-10-CM | POA: Diagnosis not present

## 2021-10-04 DIAGNOSIS — R188 Other ascites: Secondary | ICD-10-CM | POA: Diagnosis not present

## 2021-10-04 DIAGNOSIS — R112 Nausea with vomiting, unspecified: Secondary | ICD-10-CM | POA: Diagnosis not present

## 2021-10-04 DIAGNOSIS — D62 Acute posthemorrhagic anemia: Secondary | ICD-10-CM | POA: Diagnosis not present

## 2021-10-04 DIAGNOSIS — E43 Unspecified severe protein-calorie malnutrition: Secondary | ICD-10-CM | POA: Diagnosis not present

## 2021-10-04 DIAGNOSIS — R310 Gross hematuria: Secondary | ICD-10-CM | POA: Diagnosis not present

## 2021-10-04 DIAGNOSIS — K766 Portal hypertension: Secondary | ICD-10-CM | POA: Diagnosis not present

## 2021-10-04 DIAGNOSIS — D500 Iron deficiency anemia secondary to blood loss (chronic): Secondary | ICD-10-CM | POA: Diagnosis not present

## 2021-10-04 DIAGNOSIS — K3189 Other diseases of stomach and duodenum: Secondary | ICD-10-CM | POA: Diagnosis not present

## 2021-10-04 DIAGNOSIS — I8500 Esophageal varices without bleeding: Secondary | ICD-10-CM | POA: Diagnosis not present

## 2021-10-04 DIAGNOSIS — Z794 Long term (current) use of insulin: Secondary | ICD-10-CM | POA: Diagnosis not present

## 2021-10-04 DIAGNOSIS — D649 Anemia, unspecified: Secondary | ICD-10-CM | POA: Diagnosis not present

## 2021-10-04 DIAGNOSIS — K922 Gastrointestinal hemorrhage, unspecified: Secondary | ICD-10-CM | POA: Diagnosis not present

## 2021-10-04 DIAGNOSIS — N3001 Acute cystitis with hematuria: Secondary | ICD-10-CM | POA: Diagnosis not present

## 2021-10-04 DIAGNOSIS — N390 Urinary tract infection, site not specified: Secondary | ICD-10-CM | POA: Diagnosis not present

## 2021-10-04 DIAGNOSIS — K7581 Nonalcoholic steatohepatitis (NASH): Secondary | ICD-10-CM | POA: Diagnosis not present

## 2021-10-05 DIAGNOSIS — I10 Essential (primary) hypertension: Secondary | ICD-10-CM | POA: Diagnosis not present

## 2021-10-05 DIAGNOSIS — D500 Iron deficiency anemia secondary to blood loss (chronic): Secondary | ICD-10-CM | POA: Diagnosis not present

## 2021-10-05 DIAGNOSIS — R0789 Other chest pain: Secondary | ICD-10-CM | POA: Diagnosis not present

## 2021-10-05 DIAGNOSIS — K922 Gastrointestinal hemorrhage, unspecified: Secondary | ICD-10-CM | POA: Diagnosis not present

## 2021-10-06 DIAGNOSIS — K922 Gastrointestinal hemorrhage, unspecified: Secondary | ICD-10-CM | POA: Diagnosis not present

## 2021-10-07 DIAGNOSIS — R310 Gross hematuria: Secondary | ICD-10-CM | POA: Diagnosis not present

## 2021-10-07 DIAGNOSIS — I8501 Esophageal varices with bleeding: Secondary | ICD-10-CM | POA: Diagnosis not present

## 2021-10-07 DIAGNOSIS — N3000 Acute cystitis without hematuria: Secondary | ICD-10-CM | POA: Diagnosis not present

## 2021-10-07 DIAGNOSIS — I10 Essential (primary) hypertension: Secondary | ICD-10-CM | POA: Diagnosis not present

## 2021-10-11 DIAGNOSIS — G894 Chronic pain syndrome: Secondary | ICD-10-CM | POA: Diagnosis not present

## 2021-10-11 DIAGNOSIS — R011 Cardiac murmur, unspecified: Secondary | ICD-10-CM | POA: Diagnosis not present

## 2021-10-11 DIAGNOSIS — D367 Benign neoplasm of other specified sites: Secondary | ICD-10-CM | POA: Diagnosis not present

## 2021-10-11 DIAGNOSIS — M7989 Other specified soft tissue disorders: Secondary | ICD-10-CM | POA: Diagnosis not present

## 2021-10-11 DIAGNOSIS — D494 Neoplasm of unspecified behavior of bladder: Secondary | ICD-10-CM | POA: Diagnosis not present

## 2021-10-16 IMAGING — CT CT ABD-PELV W/ CM
2 of 5 series · 15 of 46 positions shown, 17 images · IV contrast (APPLIED)
Comparison: Abdominal CT 09/30/2019

CLINICAL DATA: Nausea, vomiting, diarrhea for 3 days. Fever.
Diverticulitis suspected.

EXAM:
CT ABDOMEN AND PELVIS WITH CONTRAST
TECHNIQUE: Multidetector CT imaging of the abdomen and pelvis was performed
using the standard protocol following bolus administration of
intravenous contrast.
CONTRAST:  75mL OMNIPAQUE IOHEXOL 300 MG/ML  SOLN

[Series 2: routine abd/pel with · axial · 0.88mm/px · z∈[-538,-82]mm · 12 of 103 slices shown, 14 images]
[im 6/103  soft-tissue]
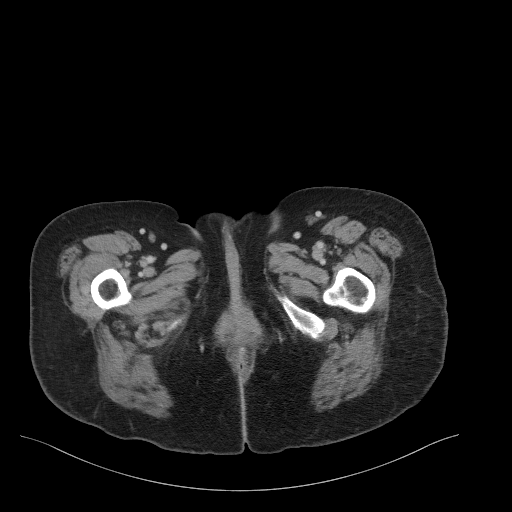
[im 6/103  bone]
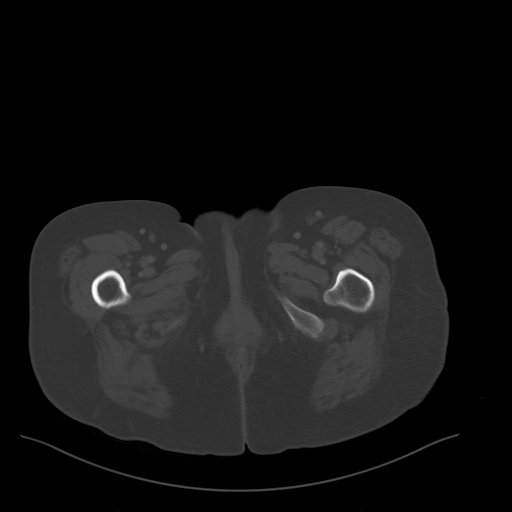
[im 17/103  soft-tissue]
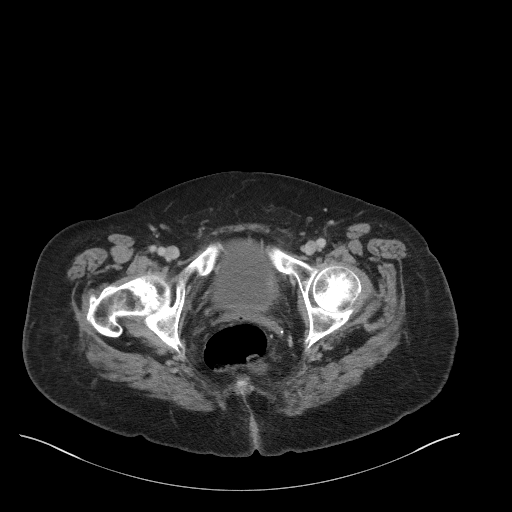
[im 22/103  soft-tissue]
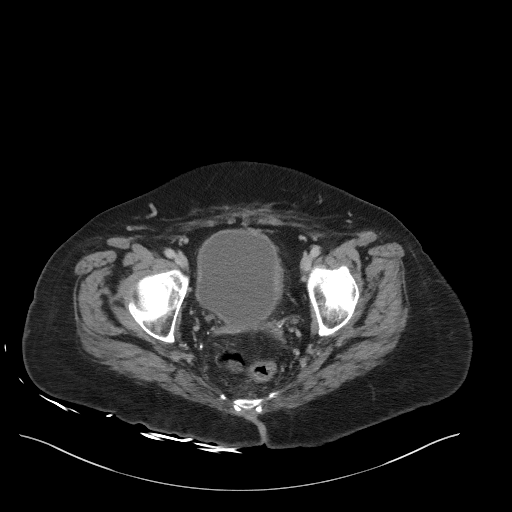
[im 33/103  soft-tissue]
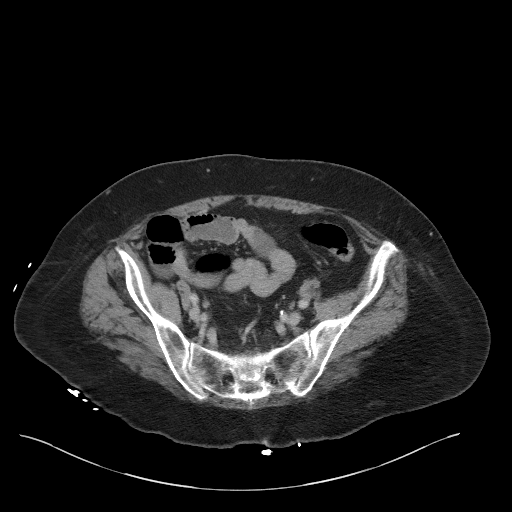
[im 38/103  soft-tissue]
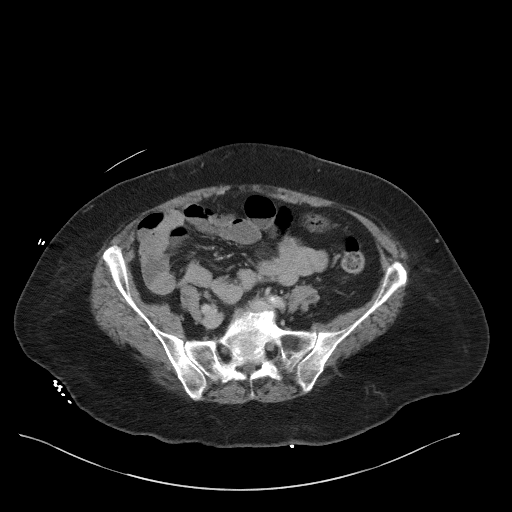
[im 49/103  soft-tissue]
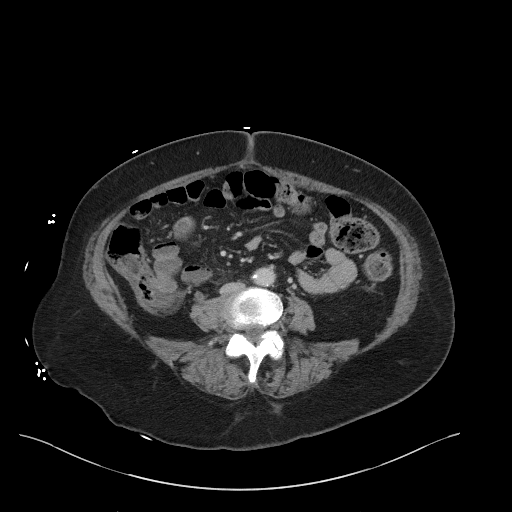
[im 54/103  soft-tissue]
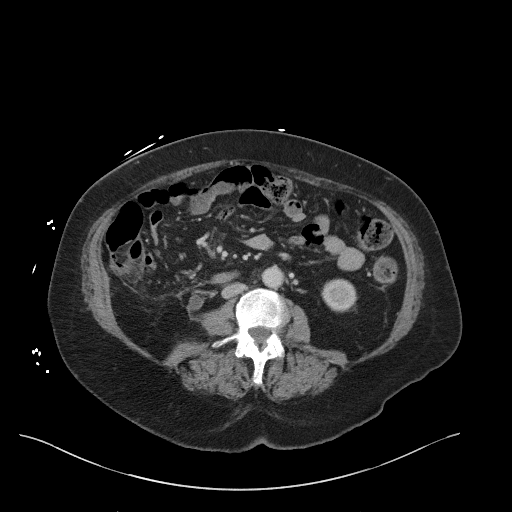
[im 65/103  soft-tissue]
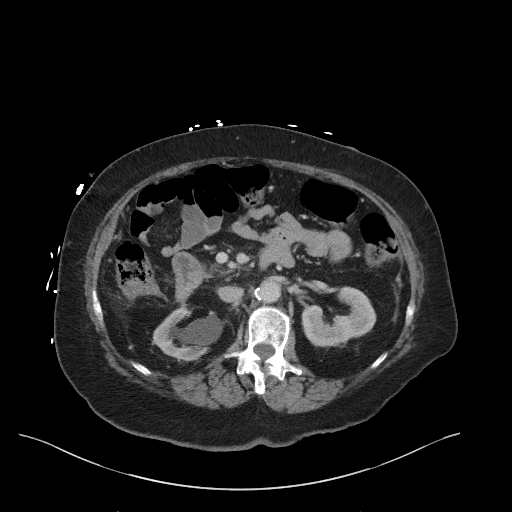
[im 70/103  soft-tissue]
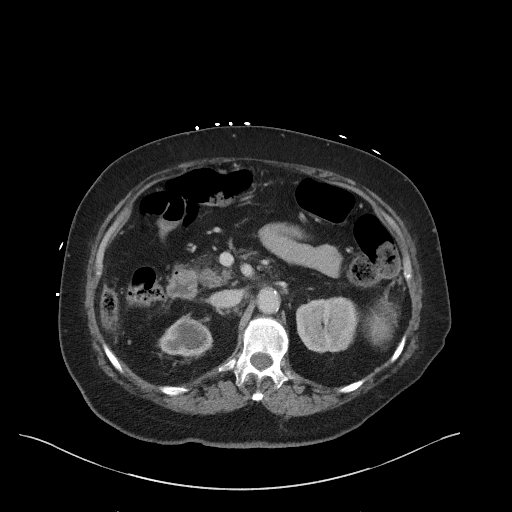
[im 70/103  bone]
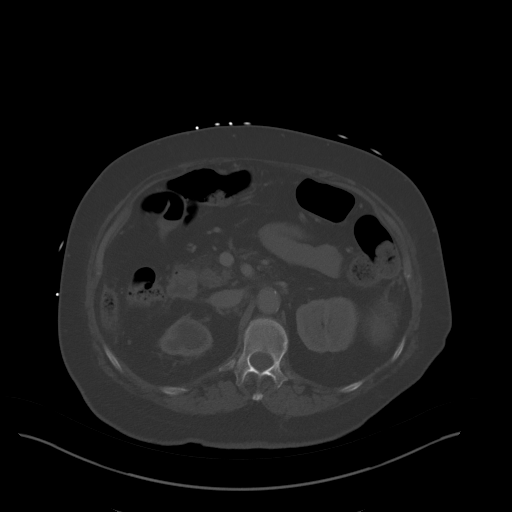
[im 81/103  soft-tissue]
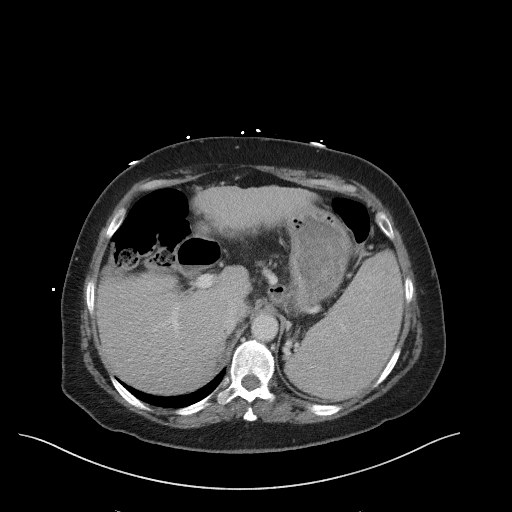
[im 86/103  soft-tissue]
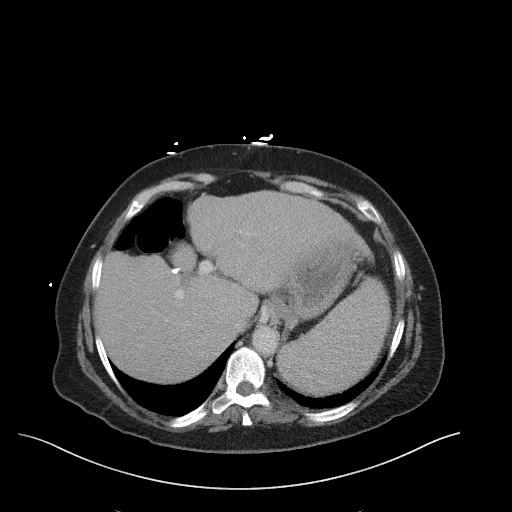
[im 97/103  soft-tissue]
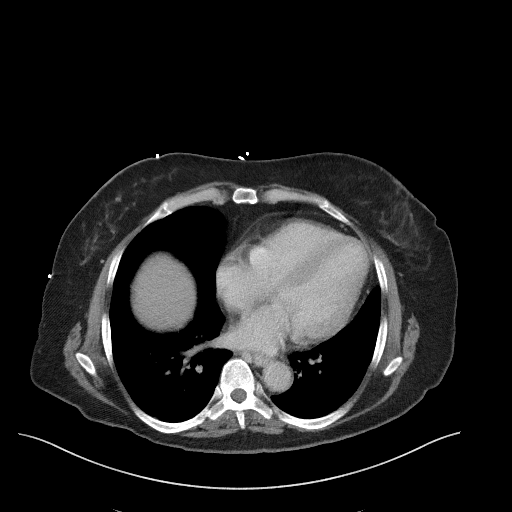

[Series 5: coronal st · coronal · 0.83mm/px · 3 of 97 slices shown]
[im 33/97  soft-tissue]
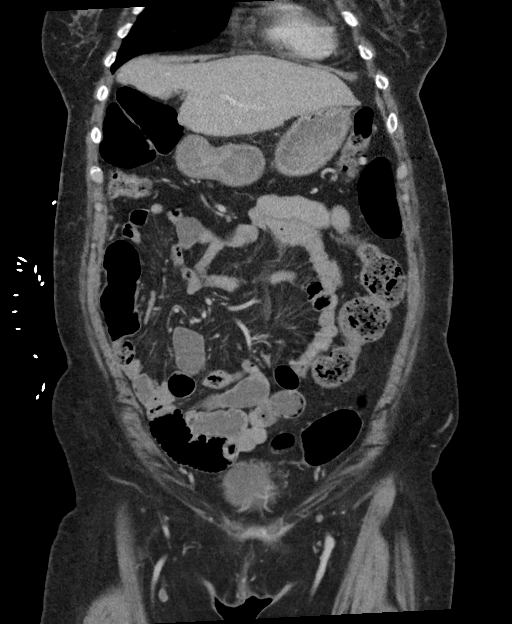
[im 43/97  soft-tissue]
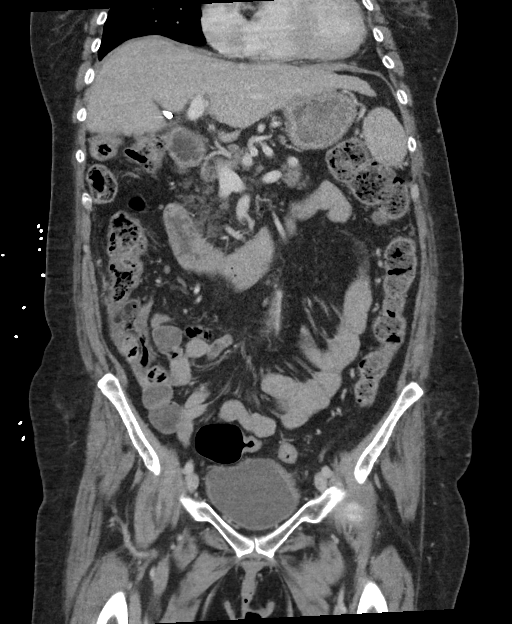
[im 54/97  soft-tissue]
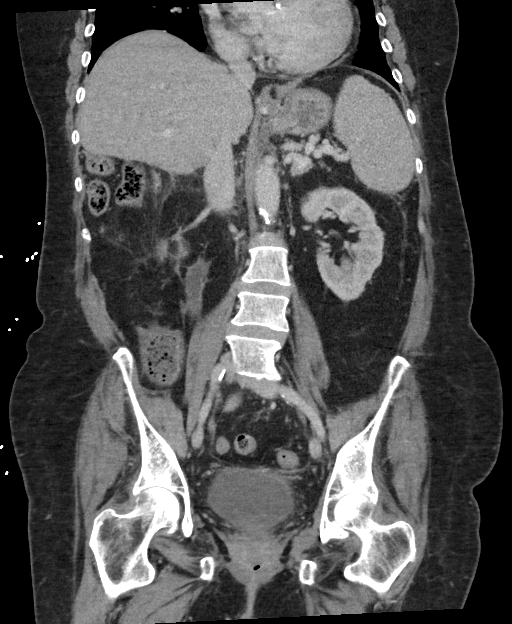

[15 of 46 positions shown; findings below may reference images not displayed]

FINDINGS: Lower chest: Minor atelectasis in the lung bases. No pleural fluid.

Hepatobiliary: Cirrhotic hepatic morphology with nodular contours.
No focal lesion. Postcholecystectomy without biliary dilatation.

Pancreas: Parenchymal atrophy. No ductal dilatation or inflammation.

Spleen: Splenomegaly with spleen measuring 14.5 AP.

Adrenals/Urinary Tract: Right hydroureteronephrosis appears chronic
and similar to prior exam. Suggestion of proximal ureteric
enhancement. There is mild right perinephric edema. Mild right renal
parenchymal thinning. No left hydronephrosis or hydroureter.
Punctate nonobstructing stone in the mid left kidney. There is
delayed excretion on delayed phase imaging from the right kidney.
Urinary bladder is partially distended. There is wall thickening
about the right dome in left bladder base. Mild perivesicular fat
stranding.

Stomach/Bowel: Varices about the distal esophagus and
gastroesophageal junction. Ingested material in the stomach. Normal
positioning of the ligament of Treitz. No small bowel obstruction or
inflammation. No terminal ileal inflammation. Appendix tentatively
visualized draped posterior to the cecum. There is no evidence of
appendicitis. Colonic tortuosity with small to moderate stool
burden. No significant diverticular disease. No diverticulitis.

Vascular/Lymphatic: Aortic atherosclerosis without aneurysm. The
portal vein is patent. Varices in the left upper quadrant.
Paraesophageal varices extend to the gastroesophageal junction.
Multiple small retroperitoneal and periportal lymph nodes are likely
reactive.

Reproductive: Status post hysterectomy. No adnexal masses.

Other: Trace fluid in the right pericolic gutter. No free air. No
intra-abdominal abscess.

Musculoskeletal: There are no acute or suspicious osseous
abnormalities. Scoliotic curvature of spine.
IMPRESSION: 1. Bladder wall thickening with perivesicular fat stranding. Right
hydronephrosis and proximal hydroureter with ureteral thickening and
perinephric edema. Findings are suspicious for cystitis and
ascending right urinary tract infection, however similar findings
were also seen on prior CT in [REDACTED]. Recommend correlation with
urinalysis.
2. Cirrhotic hepatic morphology with portal hypertension.
Splenomegaly and paraesophageal and left upper quadrant varices.
3. Punctate nonobstructing stone in the left kidney.
4.  Aortic Atherosclerosis (ZAK9U-6C7.7).

Aortic Atherosclerosis (ZAK9U-6C7.7).

## 2021-10-16 IMAGING — DX DG CHEST 1V PORT
1 series · 1 of 1 positions shown · non-contrast
Comparison: Radiograph 09/30/2019

CLINICAL DATA: Cough. Dizziness. Fever.

EXAM:
PORTABLE CHEST 1 VIEW

[chest ap]
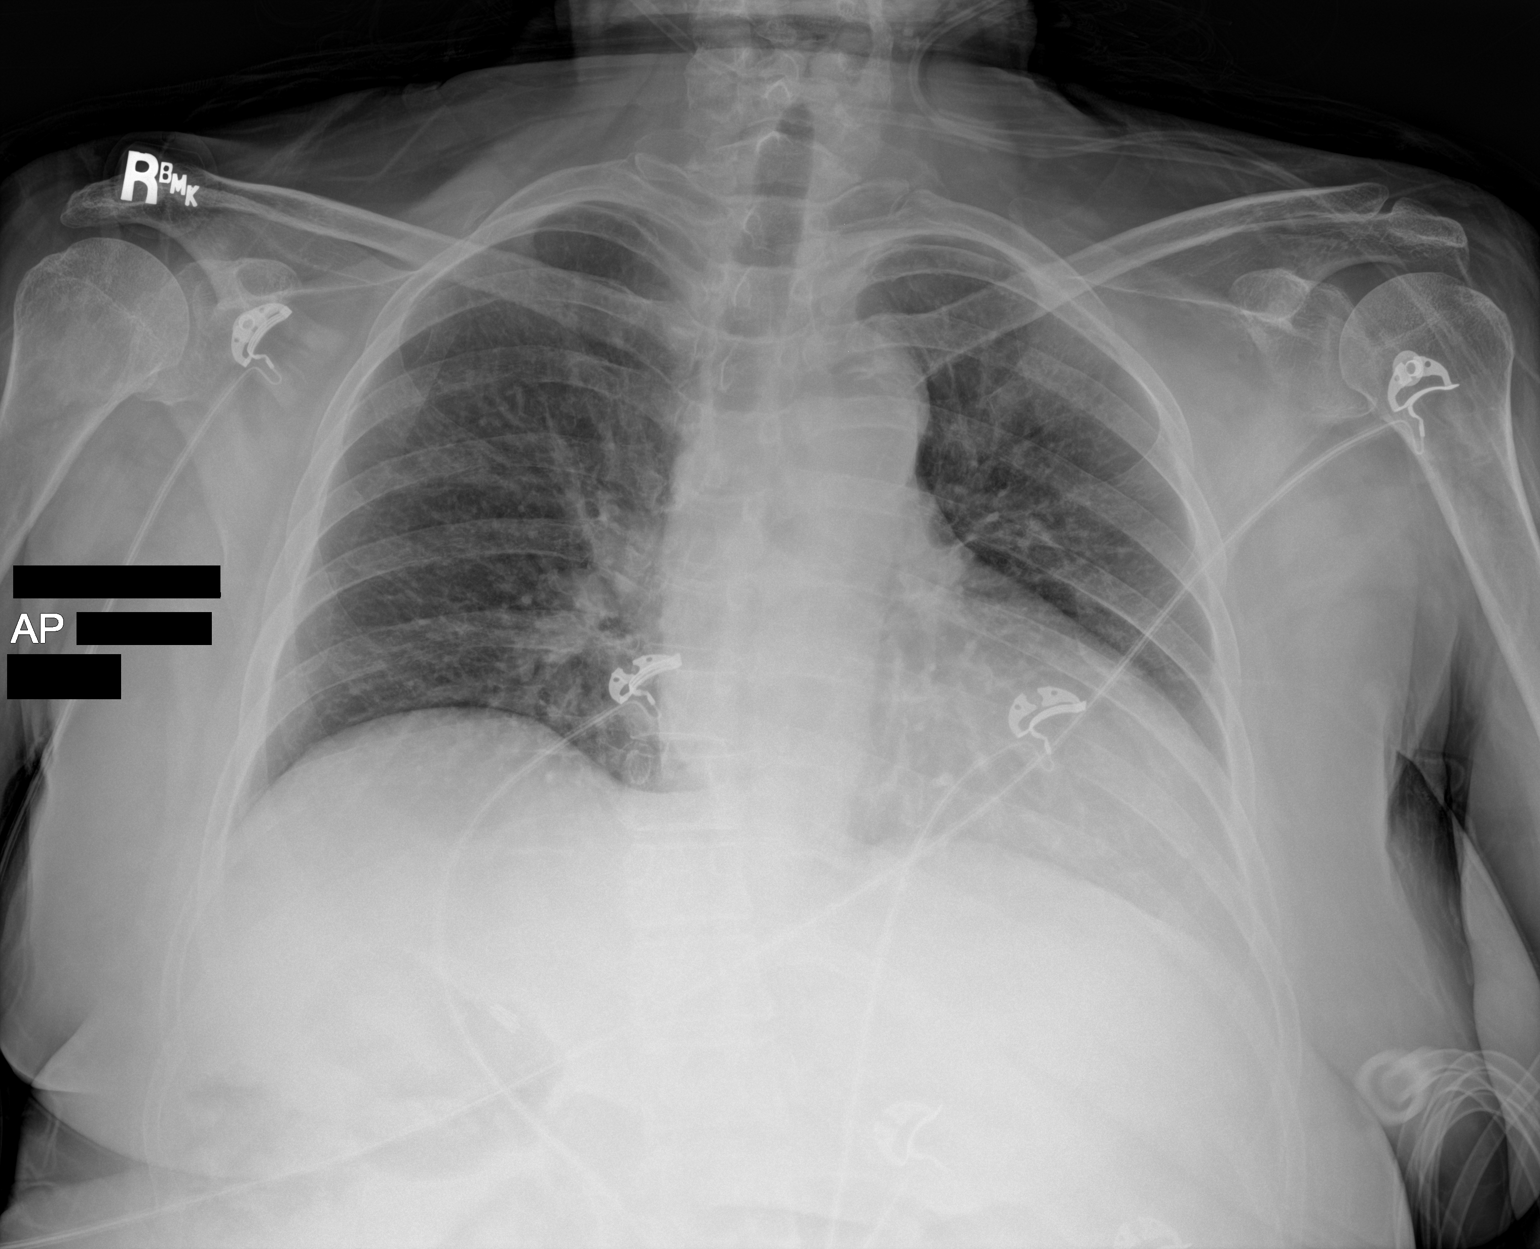

[1 of 1 positions shown; findings below may reference images not displayed]

FINDINGS: Low lung volumes. Patient is rotated. Borderline cardiomegaly,
likely accentuated by technique. Aortic atherosclerosis.
Bronchovascular crowding versus bronchial thickening. No confluent
airspace disease. No pleural effusion or pneumothorax. No acute
osseous abnormalities are seen.
IMPRESSION: Low lung volumes. Bronchovascular crowding versus bronchial
thickening.

## 2021-10-17 DIAGNOSIS — M7989 Other specified soft tissue disorders: Secondary | ICD-10-CM | POA: Diagnosis not present

## 2021-10-17 DIAGNOSIS — E1069 Type 1 diabetes mellitus with other specified complication: Secondary | ICD-10-CM | POA: Diagnosis not present

## 2021-10-17 DIAGNOSIS — R42 Dizziness and giddiness: Secondary | ICD-10-CM | POA: Diagnosis not present

## 2021-10-17 DIAGNOSIS — D494 Neoplasm of unspecified behavior of bladder: Secondary | ICD-10-CM | POA: Diagnosis not present

## 2021-10-17 DIAGNOSIS — F5101 Primary insomnia: Secondary | ICD-10-CM | POA: Diagnosis not present

## 2021-10-17 DIAGNOSIS — G894 Chronic pain syndrome: Secondary | ICD-10-CM | POA: Diagnosis not present

## 2021-10-17 DIAGNOSIS — R011 Cardiac murmur, unspecified: Secondary | ICD-10-CM | POA: Diagnosis not present

## 2021-10-17 DIAGNOSIS — M79671 Pain in right foot: Secondary | ICD-10-CM | POA: Diagnosis not present

## 2021-10-17 DIAGNOSIS — M79672 Pain in left foot: Secondary | ICD-10-CM | POA: Diagnosis not present

## 2021-10-25 DIAGNOSIS — R55 Syncope and collapse: Secondary | ICD-10-CM | POA: Diagnosis not present

## 2021-10-25 DIAGNOSIS — R600 Localized edema: Secondary | ICD-10-CM | POA: Diagnosis not present

## 2021-10-25 DIAGNOSIS — R0789 Other chest pain: Secondary | ICD-10-CM | POA: Diagnosis not present

## 2021-10-27 DIAGNOSIS — E038 Other specified hypothyroidism: Secondary | ICD-10-CM | POA: Diagnosis not present

## 2021-10-27 DIAGNOSIS — N3001 Acute cystitis with hematuria: Secondary | ICD-10-CM | POA: Diagnosis not present

## 2021-10-27 DIAGNOSIS — K7469 Other cirrhosis of liver: Secondary | ICD-10-CM | POA: Diagnosis not present

## 2021-10-27 DIAGNOSIS — Z885 Allergy status to narcotic agent status: Secondary | ICD-10-CM | POA: Diagnosis not present

## 2021-10-27 DIAGNOSIS — Z23 Encounter for immunization: Secondary | ICD-10-CM | POA: Diagnosis not present

## 2021-10-27 DIAGNOSIS — R0989 Other specified symptoms and signs involving the circulatory and respiratory systems: Secondary | ICD-10-CM | POA: Diagnosis not present

## 2021-10-27 DIAGNOSIS — M79671 Pain in right foot: Secondary | ICD-10-CM | POA: Diagnosis not present

## 2021-10-27 DIAGNOSIS — N390 Urinary tract infection, site not specified: Secondary | ICD-10-CM | POA: Diagnosis not present

## 2021-10-27 DIAGNOSIS — Z8673 Personal history of transient ischemic attack (TIA), and cerebral infarction without residual deficits: Secondary | ICD-10-CM | POA: Diagnosis not present

## 2021-10-27 DIAGNOSIS — M542 Cervicalgia: Secondary | ICD-10-CM | POA: Diagnosis not present

## 2021-10-27 DIAGNOSIS — I10 Essential (primary) hypertension: Secondary | ICD-10-CM | POA: Diagnosis not present

## 2021-10-27 DIAGNOSIS — E1140 Type 2 diabetes mellitus with diabetic neuropathy, unspecified: Secondary | ICD-10-CM | POA: Diagnosis not present

## 2021-10-27 DIAGNOSIS — R519 Headache, unspecified: Secondary | ICD-10-CM | POA: Diagnosis not present

## 2021-10-27 DIAGNOSIS — I959 Hypotension, unspecified: Secondary | ICD-10-CM | POA: Diagnosis not present

## 2021-10-27 DIAGNOSIS — R1084 Generalized abdominal pain: Secondary | ICD-10-CM | POA: Diagnosis not present

## 2021-10-27 DIAGNOSIS — I951 Orthostatic hypotension: Secondary | ICD-10-CM | POA: Diagnosis not present

## 2021-10-27 DIAGNOSIS — Z20822 Contact with and (suspected) exposure to covid-19: Secondary | ICD-10-CM | POA: Diagnosis not present

## 2021-10-27 DIAGNOSIS — M79674 Pain in right toe(s): Secondary | ICD-10-CM | POA: Diagnosis not present

## 2021-10-27 DIAGNOSIS — S0990XA Unspecified injury of head, initial encounter: Secondary | ICD-10-CM | POA: Diagnosis not present

## 2021-10-27 DIAGNOSIS — Z881 Allergy status to other antibiotic agents status: Secondary | ICD-10-CM | POA: Diagnosis not present

## 2021-10-27 DIAGNOSIS — T68XXXA Hypothermia, initial encounter: Secondary | ICD-10-CM | POA: Diagnosis not present

## 2021-10-27 DIAGNOSIS — D649 Anemia, unspecified: Secondary | ICD-10-CM | POA: Diagnosis not present

## 2021-10-27 DIAGNOSIS — E860 Dehydration: Secondary | ICD-10-CM | POA: Diagnosis not present

## 2021-10-27 DIAGNOSIS — Z794 Long term (current) use of insulin: Secondary | ICD-10-CM | POA: Diagnosis not present

## 2021-10-27 DIAGNOSIS — R52 Pain, unspecified: Secondary | ICD-10-CM | POA: Diagnosis not present

## 2021-10-27 DIAGNOSIS — Z886 Allergy status to analgesic agent status: Secondary | ICD-10-CM | POA: Diagnosis not present

## 2021-10-27 DIAGNOSIS — K7581 Nonalcoholic steatohepatitis (NASH): Secondary | ICD-10-CM | POA: Diagnosis not present

## 2021-10-27 DIAGNOSIS — R319 Hematuria, unspecified: Secondary | ICD-10-CM | POA: Diagnosis not present

## 2021-10-27 DIAGNOSIS — K3184 Gastroparesis: Secondary | ICD-10-CM | POA: Diagnosis not present

## 2021-10-27 DIAGNOSIS — R680 Hypothermia, not associated with low environmental temperature: Secondary | ICD-10-CM | POA: Diagnosis not present

## 2021-10-27 DIAGNOSIS — E43 Unspecified severe protein-calorie malnutrition: Secondary | ICD-10-CM | POA: Diagnosis not present

## 2021-10-27 DIAGNOSIS — R739 Hyperglycemia, unspecified: Secondary | ICD-10-CM | POA: Diagnosis not present

## 2021-10-27 DIAGNOSIS — N179 Acute kidney failure, unspecified: Secondary | ICD-10-CM | POA: Diagnosis not present

## 2021-10-27 DIAGNOSIS — I480 Paroxysmal atrial fibrillation: Secondary | ICD-10-CM | POA: Diagnosis not present

## 2021-10-27 DIAGNOSIS — I252 Old myocardial infarction: Secondary | ICD-10-CM | POA: Diagnosis not present

## 2021-11-01 DIAGNOSIS — I4891 Unspecified atrial fibrillation: Secondary | ICD-10-CM | POA: Diagnosis not present

## 2021-11-01 DIAGNOSIS — E162 Hypoglycemia, unspecified: Secondary | ICD-10-CM | POA: Diagnosis not present

## 2021-11-02 DIAGNOSIS — K7581 Nonalcoholic steatohepatitis (NASH): Secondary | ICD-10-CM | POA: Diagnosis not present

## 2021-11-02 DIAGNOSIS — K5730 Diverticulosis of large intestine without perforation or abscess without bleeding: Secondary | ICD-10-CM | POA: Diagnosis not present

## 2021-11-02 DIAGNOSIS — M797 Fibromyalgia: Secondary | ICD-10-CM | POA: Diagnosis not present

## 2021-11-02 DIAGNOSIS — G894 Chronic pain syndrome: Secondary | ICD-10-CM | POA: Diagnosis not present

## 2021-11-02 DIAGNOSIS — E1143 Type 2 diabetes mellitus with diabetic autonomic (poly)neuropathy: Secondary | ICD-10-CM | POA: Diagnosis not present

## 2021-11-02 DIAGNOSIS — D500 Iron deficiency anemia secondary to blood loss (chronic): Secondary | ICD-10-CM | POA: Diagnosis not present

## 2021-11-02 DIAGNOSIS — I8500 Esophageal varices without bleeding: Secondary | ICD-10-CM | POA: Diagnosis not present

## 2021-11-02 DIAGNOSIS — E43 Unspecified severe protein-calorie malnutrition: Secondary | ICD-10-CM | POA: Diagnosis not present

## 2021-11-02 DIAGNOSIS — I480 Paroxysmal atrial fibrillation: Secondary | ICD-10-CM | POA: Diagnosis not present

## 2021-11-02 DIAGNOSIS — I951 Orthostatic hypotension: Secondary | ICD-10-CM | POA: Diagnosis not present

## 2021-11-02 DIAGNOSIS — F5101 Primary insomnia: Secondary | ICD-10-CM | POA: Diagnosis not present

## 2021-11-02 DIAGNOSIS — K7469 Other cirrhosis of liver: Secondary | ICD-10-CM | POA: Diagnosis not present

## 2021-11-02 DIAGNOSIS — M47816 Spondylosis without myelopathy or radiculopathy, lumbar region: Secondary | ICD-10-CM | POA: Diagnosis not present

## 2021-11-02 DIAGNOSIS — N179 Acute kidney failure, unspecified: Secondary | ICD-10-CM | POA: Diagnosis not present

## 2021-11-02 DIAGNOSIS — E1122 Type 2 diabetes mellitus with diabetic chronic kidney disease: Secondary | ICD-10-CM | POA: Diagnosis not present

## 2021-11-02 DIAGNOSIS — I420 Dilated cardiomyopathy: Secondary | ICD-10-CM | POA: Diagnosis not present

## 2021-11-02 DIAGNOSIS — K3184 Gastroparesis: Secondary | ICD-10-CM | POA: Diagnosis not present

## 2021-11-02 DIAGNOSIS — I129 Hypertensive chronic kidney disease with stage 1 through stage 4 chronic kidney disease, or unspecified chronic kidney disease: Secondary | ICD-10-CM | POA: Diagnosis not present

## 2021-11-02 DIAGNOSIS — I2510 Atherosclerotic heart disease of native coronary artery without angina pectoris: Secondary | ICD-10-CM | POA: Diagnosis not present

## 2021-11-02 DIAGNOSIS — N3001 Acute cystitis with hematuria: Secondary | ICD-10-CM | POA: Diagnosis not present

## 2021-11-02 DIAGNOSIS — N183 Chronic kidney disease, stage 3 unspecified: Secondary | ICD-10-CM | POA: Diagnosis not present

## 2021-11-02 DIAGNOSIS — M5136 Other intervertebral disc degeneration, lumbar region: Secondary | ICD-10-CM | POA: Diagnosis not present

## 2021-11-02 DIAGNOSIS — M47812 Spondylosis without myelopathy or radiculopathy, cervical region: Secondary | ICD-10-CM | POA: Diagnosis not present

## 2021-11-02 DIAGNOSIS — D367 Benign neoplasm of other specified sites: Secondary | ICD-10-CM | POA: Diagnosis not present

## 2021-11-02 DIAGNOSIS — E1140 Type 2 diabetes mellitus with diabetic neuropathy, unspecified: Secondary | ICD-10-CM | POA: Diagnosis not present

## 2021-11-07 DIAGNOSIS — I129 Hypertensive chronic kidney disease with stage 1 through stage 4 chronic kidney disease, or unspecified chronic kidney disease: Secondary | ICD-10-CM | POA: Diagnosis not present

## 2021-11-07 DIAGNOSIS — E1143 Type 2 diabetes mellitus with diabetic autonomic (poly)neuropathy: Secondary | ICD-10-CM | POA: Diagnosis not present

## 2021-11-07 DIAGNOSIS — M797 Fibromyalgia: Secondary | ICD-10-CM | POA: Diagnosis not present

## 2021-11-07 DIAGNOSIS — I480 Paroxysmal atrial fibrillation: Secondary | ICD-10-CM | POA: Diagnosis not present

## 2021-11-07 DIAGNOSIS — K5730 Diverticulosis of large intestine without perforation or abscess without bleeding: Secondary | ICD-10-CM | POA: Diagnosis not present

## 2021-11-07 DIAGNOSIS — K7469 Other cirrhosis of liver: Secondary | ICD-10-CM | POA: Diagnosis not present

## 2021-11-07 DIAGNOSIS — N183 Chronic kidney disease, stage 3 unspecified: Secondary | ICD-10-CM | POA: Diagnosis not present

## 2021-11-07 DIAGNOSIS — F5101 Primary insomnia: Secondary | ICD-10-CM | POA: Diagnosis not present

## 2021-11-07 DIAGNOSIS — M47812 Spondylosis without myelopathy or radiculopathy, cervical region: Secondary | ICD-10-CM | POA: Diagnosis not present

## 2021-11-07 DIAGNOSIS — D367 Benign neoplasm of other specified sites: Secondary | ICD-10-CM | POA: Diagnosis not present

## 2021-11-07 DIAGNOSIS — K7581 Nonalcoholic steatohepatitis (NASH): Secondary | ICD-10-CM | POA: Diagnosis not present

## 2021-11-07 DIAGNOSIS — K3184 Gastroparesis: Secondary | ICD-10-CM | POA: Diagnosis not present

## 2021-11-07 DIAGNOSIS — M5136 Other intervertebral disc degeneration, lumbar region: Secondary | ICD-10-CM | POA: Diagnosis not present

## 2021-11-07 DIAGNOSIS — I420 Dilated cardiomyopathy: Secondary | ICD-10-CM | POA: Diagnosis not present

## 2021-11-07 DIAGNOSIS — N3001 Acute cystitis with hematuria: Secondary | ICD-10-CM | POA: Diagnosis not present

## 2021-11-07 DIAGNOSIS — M47816 Spondylosis without myelopathy or radiculopathy, lumbar region: Secondary | ICD-10-CM | POA: Diagnosis not present

## 2021-11-07 DIAGNOSIS — I951 Orthostatic hypotension: Secondary | ICD-10-CM | POA: Diagnosis not present

## 2021-11-07 DIAGNOSIS — I2510 Atherosclerotic heart disease of native coronary artery without angina pectoris: Secondary | ICD-10-CM | POA: Diagnosis not present

## 2021-11-07 DIAGNOSIS — E1122 Type 2 diabetes mellitus with diabetic chronic kidney disease: Secondary | ICD-10-CM | POA: Diagnosis not present

## 2021-11-07 DIAGNOSIS — N179 Acute kidney failure, unspecified: Secondary | ICD-10-CM | POA: Diagnosis not present

## 2021-11-07 DIAGNOSIS — E43 Unspecified severe protein-calorie malnutrition: Secondary | ICD-10-CM | POA: Diagnosis not present

## 2021-11-07 DIAGNOSIS — D500 Iron deficiency anemia secondary to blood loss (chronic): Secondary | ICD-10-CM | POA: Diagnosis not present

## 2021-11-07 DIAGNOSIS — I8500 Esophageal varices without bleeding: Secondary | ICD-10-CM | POA: Diagnosis not present

## 2021-11-07 DIAGNOSIS — E1140 Type 2 diabetes mellitus with diabetic neuropathy, unspecified: Secondary | ICD-10-CM | POA: Diagnosis not present

## 2021-11-07 DIAGNOSIS — G894 Chronic pain syndrome: Secondary | ICD-10-CM | POA: Diagnosis not present

## 2021-11-08 DIAGNOSIS — N3001 Acute cystitis with hematuria: Secondary | ICD-10-CM | POA: Diagnosis not present

## 2021-11-08 DIAGNOSIS — I8500 Esophageal varices without bleeding: Secondary | ICD-10-CM | POA: Diagnosis not present

## 2021-11-08 DIAGNOSIS — K3184 Gastroparesis: Secondary | ICD-10-CM | POA: Diagnosis not present

## 2021-11-08 DIAGNOSIS — E1122 Type 2 diabetes mellitus with diabetic chronic kidney disease: Secondary | ICD-10-CM | POA: Diagnosis not present

## 2021-11-08 DIAGNOSIS — I2510 Atherosclerotic heart disease of native coronary artery without angina pectoris: Secondary | ICD-10-CM | POA: Diagnosis not present

## 2021-11-08 DIAGNOSIS — I951 Orthostatic hypotension: Secondary | ICD-10-CM | POA: Diagnosis not present

## 2021-11-08 DIAGNOSIS — E1140 Type 2 diabetes mellitus with diabetic neuropathy, unspecified: Secondary | ICD-10-CM | POA: Diagnosis not present

## 2021-11-08 DIAGNOSIS — I420 Dilated cardiomyopathy: Secondary | ICD-10-CM | POA: Diagnosis not present

## 2021-11-08 DIAGNOSIS — I129 Hypertensive chronic kidney disease with stage 1 through stage 4 chronic kidney disease, or unspecified chronic kidney disease: Secondary | ICD-10-CM | POA: Diagnosis not present

## 2021-11-08 DIAGNOSIS — E43 Unspecified severe protein-calorie malnutrition: Secondary | ICD-10-CM | POA: Diagnosis not present

## 2021-11-08 DIAGNOSIS — M797 Fibromyalgia: Secondary | ICD-10-CM | POA: Diagnosis not present

## 2021-11-08 DIAGNOSIS — I480 Paroxysmal atrial fibrillation: Secondary | ICD-10-CM | POA: Diagnosis not present

## 2021-11-08 DIAGNOSIS — D500 Iron deficiency anemia secondary to blood loss (chronic): Secondary | ICD-10-CM | POA: Diagnosis not present

## 2021-11-08 DIAGNOSIS — N179 Acute kidney failure, unspecified: Secondary | ICD-10-CM | POA: Diagnosis not present

## 2021-11-08 DIAGNOSIS — G894 Chronic pain syndrome: Secondary | ICD-10-CM | POA: Diagnosis not present

## 2021-11-08 DIAGNOSIS — M5136 Other intervertebral disc degeneration, lumbar region: Secondary | ICD-10-CM | POA: Diagnosis not present

## 2021-11-08 DIAGNOSIS — M47812 Spondylosis without myelopathy or radiculopathy, cervical region: Secondary | ICD-10-CM | POA: Diagnosis not present

## 2021-11-08 DIAGNOSIS — D367 Benign neoplasm of other specified sites: Secondary | ICD-10-CM | POA: Diagnosis not present

## 2021-11-08 DIAGNOSIS — F5101 Primary insomnia: Secondary | ICD-10-CM | POA: Diagnosis not present

## 2021-11-08 DIAGNOSIS — K7581 Nonalcoholic steatohepatitis (NASH): Secondary | ICD-10-CM | POA: Diagnosis not present

## 2021-11-08 DIAGNOSIS — K7469 Other cirrhosis of liver: Secondary | ICD-10-CM | POA: Diagnosis not present

## 2021-11-08 DIAGNOSIS — K5730 Diverticulosis of large intestine without perforation or abscess without bleeding: Secondary | ICD-10-CM | POA: Diagnosis not present

## 2021-11-08 DIAGNOSIS — E1143 Type 2 diabetes mellitus with diabetic autonomic (poly)neuropathy: Secondary | ICD-10-CM | POA: Diagnosis not present

## 2021-11-08 DIAGNOSIS — N183 Chronic kidney disease, stage 3 unspecified: Secondary | ICD-10-CM | POA: Diagnosis not present

## 2021-11-08 DIAGNOSIS — M47816 Spondylosis without myelopathy or radiculopathy, lumbar region: Secondary | ICD-10-CM | POA: Diagnosis not present

## 2021-11-08 DIAGNOSIS — R55 Syncope and collapse: Secondary | ICD-10-CM | POA: Diagnosis not present

## 2021-11-09 DIAGNOSIS — N179 Acute kidney failure, unspecified: Secondary | ICD-10-CM | POA: Diagnosis not present

## 2021-11-09 DIAGNOSIS — N183 Chronic kidney disease, stage 3 unspecified: Secondary | ICD-10-CM | POA: Diagnosis not present

## 2021-11-09 DIAGNOSIS — M47812 Spondylosis without myelopathy or radiculopathy, cervical region: Secondary | ICD-10-CM | POA: Diagnosis not present

## 2021-11-09 DIAGNOSIS — E1140 Type 2 diabetes mellitus with diabetic neuropathy, unspecified: Secondary | ICD-10-CM | POA: Diagnosis not present

## 2021-11-09 DIAGNOSIS — I8500 Esophageal varices without bleeding: Secondary | ICD-10-CM | POA: Diagnosis not present

## 2021-11-09 DIAGNOSIS — I480 Paroxysmal atrial fibrillation: Secondary | ICD-10-CM | POA: Diagnosis not present

## 2021-11-09 DIAGNOSIS — G894 Chronic pain syndrome: Secondary | ICD-10-CM | POA: Diagnosis not present

## 2021-11-09 DIAGNOSIS — I129 Hypertensive chronic kidney disease with stage 1 through stage 4 chronic kidney disease, or unspecified chronic kidney disease: Secondary | ICD-10-CM | POA: Diagnosis not present

## 2021-11-09 DIAGNOSIS — M797 Fibromyalgia: Secondary | ICD-10-CM | POA: Diagnosis not present

## 2021-11-09 DIAGNOSIS — M5136 Other intervertebral disc degeneration, lumbar region: Secondary | ICD-10-CM | POA: Diagnosis not present

## 2021-11-09 DIAGNOSIS — E43 Unspecified severe protein-calorie malnutrition: Secondary | ICD-10-CM | POA: Diagnosis not present

## 2021-11-09 DIAGNOSIS — I951 Orthostatic hypotension: Secondary | ICD-10-CM | POA: Diagnosis not present

## 2021-11-09 DIAGNOSIS — M47816 Spondylosis without myelopathy or radiculopathy, lumbar region: Secondary | ICD-10-CM | POA: Diagnosis not present

## 2021-11-09 DIAGNOSIS — D500 Iron deficiency anemia secondary to blood loss (chronic): Secondary | ICD-10-CM | POA: Diagnosis not present

## 2021-11-09 DIAGNOSIS — D367 Benign neoplasm of other specified sites: Secondary | ICD-10-CM | POA: Diagnosis not present

## 2021-11-09 DIAGNOSIS — K3184 Gastroparesis: Secondary | ICD-10-CM | POA: Diagnosis not present

## 2021-11-09 DIAGNOSIS — I420 Dilated cardiomyopathy: Secondary | ICD-10-CM | POA: Diagnosis not present

## 2021-11-09 DIAGNOSIS — K7469 Other cirrhosis of liver: Secondary | ICD-10-CM | POA: Diagnosis not present

## 2021-11-09 DIAGNOSIS — N3001 Acute cystitis with hematuria: Secondary | ICD-10-CM | POA: Diagnosis not present

## 2021-11-09 DIAGNOSIS — E1143 Type 2 diabetes mellitus with diabetic autonomic (poly)neuropathy: Secondary | ICD-10-CM | POA: Diagnosis not present

## 2021-11-09 DIAGNOSIS — E1122 Type 2 diabetes mellitus with diabetic chronic kidney disease: Secondary | ICD-10-CM | POA: Diagnosis not present

## 2021-11-09 DIAGNOSIS — F5101 Primary insomnia: Secondary | ICD-10-CM | POA: Diagnosis not present

## 2021-11-09 DIAGNOSIS — K7581 Nonalcoholic steatohepatitis (NASH): Secondary | ICD-10-CM | POA: Diagnosis not present

## 2021-11-09 DIAGNOSIS — K5730 Diverticulosis of large intestine without perforation or abscess without bleeding: Secondary | ICD-10-CM | POA: Diagnosis not present

## 2021-11-09 DIAGNOSIS — I2510 Atherosclerotic heart disease of native coronary artery without angina pectoris: Secondary | ICD-10-CM | POA: Diagnosis not present

## 2021-11-10 DIAGNOSIS — E119 Type 2 diabetes mellitus without complications: Secondary | ICD-10-CM | POA: Diagnosis not present

## 2021-11-10 DIAGNOSIS — M545 Low back pain, unspecified: Secondary | ICD-10-CM | POA: Diagnosis not present

## 2021-11-10 DIAGNOSIS — D494 Neoplasm of unspecified behavior of bladder: Secondary | ICD-10-CM | POA: Diagnosis not present

## 2021-11-10 DIAGNOSIS — Z789 Other specified health status: Secondary | ICD-10-CM | POA: Diagnosis not present

## 2021-11-10 DIAGNOSIS — R011 Cardiac murmur, unspecified: Secondary | ICD-10-CM | POA: Diagnosis not present

## 2021-11-10 DIAGNOSIS — M79671 Pain in right foot: Secondary | ICD-10-CM | POA: Diagnosis not present

## 2021-11-10 DIAGNOSIS — M5441 Lumbago with sciatica, right side: Secondary | ICD-10-CM | POA: Diagnosis not present

## 2021-11-10 DIAGNOSIS — M79672 Pain in left foot: Secondary | ICD-10-CM | POA: Diagnosis not present

## 2021-11-10 DIAGNOSIS — M7989 Other specified soft tissue disorders: Secondary | ICD-10-CM | POA: Diagnosis not present

## 2021-11-10 DIAGNOSIS — E1069 Type 1 diabetes mellitus with other specified complication: Secondary | ICD-10-CM | POA: Diagnosis not present

## 2021-11-10 DIAGNOSIS — R42 Dizziness and giddiness: Secondary | ICD-10-CM | POA: Diagnosis not present

## 2021-11-10 DIAGNOSIS — M5442 Lumbago with sciatica, left side: Secondary | ICD-10-CM | POA: Diagnosis not present

## 2021-11-11 DIAGNOSIS — T4275XA Adverse effect of unspecified antiepileptic and sedative-hypnotic drugs, initial encounter: Secondary | ICD-10-CM | POA: Diagnosis not present

## 2021-11-11 DIAGNOSIS — R404 Transient alteration of awareness: Secondary | ICD-10-CM | POA: Diagnosis not present

## 2021-11-11 DIAGNOSIS — Z886 Allergy status to analgesic agent status: Secondary | ICD-10-CM | POA: Diagnosis not present

## 2021-11-11 DIAGNOSIS — Z8673 Personal history of transient ischemic attack (TIA), and cerebral infarction without residual deficits: Secondary | ICD-10-CM | POA: Diagnosis not present

## 2021-11-11 DIAGNOSIS — Z887 Allergy status to serum and vaccine status: Secondary | ICD-10-CM | POA: Diagnosis not present

## 2021-11-11 DIAGNOSIS — R29818 Other symptoms and signs involving the nervous system: Secondary | ICD-10-CM | POA: Diagnosis not present

## 2021-11-11 DIAGNOSIS — T50905A Adverse effect of unspecified drugs, medicaments and biological substances, initial encounter: Secondary | ICD-10-CM | POA: Diagnosis not present

## 2021-11-11 DIAGNOSIS — G8929 Other chronic pain: Secondary | ICD-10-CM | POA: Diagnosis not present

## 2021-11-11 DIAGNOSIS — Z743 Need for continuous supervision: Secondary | ICD-10-CM | POA: Diagnosis not present

## 2021-11-11 DIAGNOSIS — Z79899 Other long term (current) drug therapy: Secondary | ICD-10-CM | POA: Diagnosis not present

## 2021-11-11 DIAGNOSIS — I480 Paroxysmal atrial fibrillation: Secondary | ICD-10-CM | POA: Diagnosis not present

## 2021-11-11 DIAGNOSIS — I499 Cardiac arrhythmia, unspecified: Secondary | ICD-10-CM | POA: Diagnosis not present

## 2021-11-11 DIAGNOSIS — R109 Unspecified abdominal pain: Secondary | ICD-10-CM | POA: Diagnosis not present

## 2021-11-11 DIAGNOSIS — Z885 Allergy status to narcotic agent status: Secondary | ICD-10-CM | POA: Diagnosis not present

## 2021-11-11 DIAGNOSIS — G928 Other toxic encephalopathy: Secondary | ICD-10-CM | POA: Diagnosis not present

## 2021-11-11 DIAGNOSIS — I6523 Occlusion and stenosis of bilateral carotid arteries: Secondary | ICD-10-CM | POA: Diagnosis not present

## 2021-11-11 DIAGNOSIS — K3184 Gastroparesis: Secondary | ICD-10-CM | POA: Diagnosis not present

## 2021-11-11 DIAGNOSIS — R4020 Unspecified coma: Secondary | ICD-10-CM | POA: Diagnosis not present

## 2021-11-11 DIAGNOSIS — Z881 Allergy status to other antibiotic agents status: Secondary | ICD-10-CM | POA: Diagnosis not present

## 2021-11-11 DIAGNOSIS — I252 Old myocardial infarction: Secondary | ICD-10-CM | POA: Diagnosis not present

## 2021-11-11 DIAGNOSIS — R4701 Aphasia: Secondary | ICD-10-CM | POA: Diagnosis not present

## 2021-11-12 DIAGNOSIS — R29818 Other symptoms and signs involving the nervous system: Secondary | ICD-10-CM | POA: Diagnosis not present

## 2021-11-12 DIAGNOSIS — R109 Unspecified abdominal pain: Secondary | ICD-10-CM | POA: Diagnosis not present

## 2021-11-12 DIAGNOSIS — I6523 Occlusion and stenosis of bilateral carotid arteries: Secondary | ICD-10-CM | POA: Diagnosis not present

## 2021-11-16 DIAGNOSIS — I480 Paroxysmal atrial fibrillation: Secondary | ICD-10-CM | POA: Diagnosis not present

## 2021-11-16 DIAGNOSIS — I10 Essential (primary) hypertension: Secondary | ICD-10-CM | POA: Diagnosis not present

## 2021-11-16 DIAGNOSIS — E1069 Type 1 diabetes mellitus with other specified complication: Secondary | ICD-10-CM | POA: Diagnosis not present

## 2021-11-16 DIAGNOSIS — R55 Syncope and collapse: Secondary | ICD-10-CM | POA: Diagnosis not present

## 2021-11-18 DIAGNOSIS — Z9289 Personal history of other medical treatment: Secondary | ICD-10-CM | POA: Diagnosis not present

## 2021-11-18 DIAGNOSIS — M545 Low back pain, unspecified: Secondary | ICD-10-CM | POA: Diagnosis not present

## 2021-11-18 DIAGNOSIS — Z09 Encounter for follow-up examination after completed treatment for conditions other than malignant neoplasm: Secondary | ICD-10-CM | POA: Diagnosis not present

## 2021-11-18 DIAGNOSIS — D494 Neoplasm of unspecified behavior of bladder: Secondary | ICD-10-CM | POA: Diagnosis not present

## 2021-11-18 DIAGNOSIS — G4701 Insomnia due to medical condition: Secondary | ICD-10-CM | POA: Diagnosis not present

## 2021-11-20 DIAGNOSIS — N3001 Acute cystitis with hematuria: Secondary | ICD-10-CM | POA: Diagnosis not present

## 2021-11-20 DIAGNOSIS — D500 Iron deficiency anemia secondary to blood loss (chronic): Secondary | ICD-10-CM | POA: Diagnosis not present

## 2021-11-20 DIAGNOSIS — K7469 Other cirrhosis of liver: Secondary | ICD-10-CM | POA: Diagnosis not present

## 2021-11-20 DIAGNOSIS — I480 Paroxysmal atrial fibrillation: Secondary | ICD-10-CM | POA: Diagnosis not present

## 2021-11-20 DIAGNOSIS — I951 Orthostatic hypotension: Secondary | ICD-10-CM | POA: Diagnosis not present

## 2021-11-20 DIAGNOSIS — F5101 Primary insomnia: Secondary | ICD-10-CM | POA: Diagnosis not present

## 2021-11-20 DIAGNOSIS — E1122 Type 2 diabetes mellitus with diabetic chronic kidney disease: Secondary | ICD-10-CM | POA: Diagnosis not present

## 2021-11-20 DIAGNOSIS — E1140 Type 2 diabetes mellitus with diabetic neuropathy, unspecified: Secondary | ICD-10-CM | POA: Diagnosis not present

## 2021-11-20 DIAGNOSIS — N179 Acute kidney failure, unspecified: Secondary | ICD-10-CM | POA: Diagnosis not present

## 2021-11-20 DIAGNOSIS — K3184 Gastroparesis: Secondary | ICD-10-CM | POA: Diagnosis not present

## 2021-11-20 DIAGNOSIS — K7581 Nonalcoholic steatohepatitis (NASH): Secondary | ICD-10-CM | POA: Diagnosis not present

## 2021-11-20 DIAGNOSIS — M47812 Spondylosis without myelopathy or radiculopathy, cervical region: Secondary | ICD-10-CM | POA: Diagnosis not present

## 2021-11-20 DIAGNOSIS — E43 Unspecified severe protein-calorie malnutrition: Secondary | ICD-10-CM | POA: Diagnosis not present

## 2021-11-20 DIAGNOSIS — I129 Hypertensive chronic kidney disease with stage 1 through stage 4 chronic kidney disease, or unspecified chronic kidney disease: Secondary | ICD-10-CM | POA: Diagnosis not present

## 2021-11-20 DIAGNOSIS — M47816 Spondylosis without myelopathy or radiculopathy, lumbar region: Secondary | ICD-10-CM | POA: Diagnosis not present

## 2021-11-20 DIAGNOSIS — N183 Chronic kidney disease, stage 3 unspecified: Secondary | ICD-10-CM | POA: Diagnosis not present

## 2021-11-20 DIAGNOSIS — I2510 Atherosclerotic heart disease of native coronary artery without angina pectoris: Secondary | ICD-10-CM | POA: Diagnosis not present

## 2021-11-20 DIAGNOSIS — G894 Chronic pain syndrome: Secondary | ICD-10-CM | POA: Diagnosis not present

## 2021-11-20 DIAGNOSIS — D367 Benign neoplasm of other specified sites: Secondary | ICD-10-CM | POA: Diagnosis not present

## 2021-11-20 DIAGNOSIS — M5136 Other intervertebral disc degeneration, lumbar region: Secondary | ICD-10-CM | POA: Diagnosis not present

## 2021-11-20 DIAGNOSIS — I8500 Esophageal varices without bleeding: Secondary | ICD-10-CM | POA: Diagnosis not present

## 2021-11-20 DIAGNOSIS — K5730 Diverticulosis of large intestine without perforation or abscess without bleeding: Secondary | ICD-10-CM | POA: Diagnosis not present

## 2021-11-20 DIAGNOSIS — M797 Fibromyalgia: Secondary | ICD-10-CM | POA: Diagnosis not present

## 2021-11-20 DIAGNOSIS — I420 Dilated cardiomyopathy: Secondary | ICD-10-CM | POA: Diagnosis not present

## 2021-11-20 DIAGNOSIS — E1143 Type 2 diabetes mellitus with diabetic autonomic (poly)neuropathy: Secondary | ICD-10-CM | POA: Diagnosis not present

## 2021-11-21 DIAGNOSIS — K5730 Diverticulosis of large intestine without perforation or abscess without bleeding: Secondary | ICD-10-CM | POA: Diagnosis not present

## 2021-11-21 DIAGNOSIS — I2510 Atherosclerotic heart disease of native coronary artery without angina pectoris: Secondary | ICD-10-CM | POA: Diagnosis not present

## 2021-11-21 DIAGNOSIS — M47816 Spondylosis without myelopathy or radiculopathy, lumbar region: Secondary | ICD-10-CM | POA: Diagnosis not present

## 2021-11-21 DIAGNOSIS — I129 Hypertensive chronic kidney disease with stage 1 through stage 4 chronic kidney disease, or unspecified chronic kidney disease: Secondary | ICD-10-CM | POA: Diagnosis not present

## 2021-11-21 DIAGNOSIS — I480 Paroxysmal atrial fibrillation: Secondary | ICD-10-CM | POA: Diagnosis not present

## 2021-11-21 DIAGNOSIS — E1140 Type 2 diabetes mellitus with diabetic neuropathy, unspecified: Secondary | ICD-10-CM | POA: Diagnosis not present

## 2021-11-21 DIAGNOSIS — I8500 Esophageal varices without bleeding: Secondary | ICD-10-CM | POA: Diagnosis not present

## 2021-11-21 DIAGNOSIS — K7469 Other cirrhosis of liver: Secondary | ICD-10-CM | POA: Diagnosis not present

## 2021-11-21 DIAGNOSIS — I420 Dilated cardiomyopathy: Secondary | ICD-10-CM | POA: Diagnosis not present

## 2021-11-21 DIAGNOSIS — E1143 Type 2 diabetes mellitus with diabetic autonomic (poly)neuropathy: Secondary | ICD-10-CM | POA: Diagnosis not present

## 2021-11-21 DIAGNOSIS — E43 Unspecified severe protein-calorie malnutrition: Secondary | ICD-10-CM | POA: Diagnosis not present

## 2021-11-21 DIAGNOSIS — I951 Orthostatic hypotension: Secondary | ICD-10-CM | POA: Diagnosis not present

## 2021-11-21 DIAGNOSIS — G894 Chronic pain syndrome: Secondary | ICD-10-CM | POA: Diagnosis not present

## 2021-11-21 DIAGNOSIS — D500 Iron deficiency anemia secondary to blood loss (chronic): Secondary | ICD-10-CM | POA: Diagnosis not present

## 2021-11-21 DIAGNOSIS — N3001 Acute cystitis with hematuria: Secondary | ICD-10-CM | POA: Diagnosis not present

## 2021-11-21 DIAGNOSIS — N179 Acute kidney failure, unspecified: Secondary | ICD-10-CM | POA: Diagnosis not present

## 2021-11-21 DIAGNOSIS — D367 Benign neoplasm of other specified sites: Secondary | ICD-10-CM | POA: Diagnosis not present

## 2021-11-21 DIAGNOSIS — K3184 Gastroparesis: Secondary | ICD-10-CM | POA: Diagnosis not present

## 2021-11-21 DIAGNOSIS — M5136 Other intervertebral disc degeneration, lumbar region: Secondary | ICD-10-CM | POA: Diagnosis not present

## 2021-11-21 DIAGNOSIS — M47812 Spondylosis without myelopathy or radiculopathy, cervical region: Secondary | ICD-10-CM | POA: Diagnosis not present

## 2021-11-21 DIAGNOSIS — E1122 Type 2 diabetes mellitus with diabetic chronic kidney disease: Secondary | ICD-10-CM | POA: Diagnosis not present

## 2021-11-21 DIAGNOSIS — M797 Fibromyalgia: Secondary | ICD-10-CM | POA: Diagnosis not present

## 2021-11-21 DIAGNOSIS — F5101 Primary insomnia: Secondary | ICD-10-CM | POA: Diagnosis not present

## 2021-11-21 DIAGNOSIS — N183 Chronic kidney disease, stage 3 unspecified: Secondary | ICD-10-CM | POA: Diagnosis not present

## 2021-11-21 DIAGNOSIS — K7581 Nonalcoholic steatohepatitis (NASH): Secondary | ICD-10-CM | POA: Diagnosis not present

## 2021-11-24 DIAGNOSIS — M47816 Spondylosis without myelopathy or radiculopathy, lumbar region: Secondary | ICD-10-CM | POA: Diagnosis not present

## 2021-11-24 DIAGNOSIS — G8929 Other chronic pain: Secondary | ICD-10-CM | POA: Diagnosis not present

## 2021-11-24 DIAGNOSIS — R1084 Generalized abdominal pain: Secondary | ICD-10-CM | POA: Diagnosis not present

## 2021-11-28 DIAGNOSIS — N179 Acute kidney failure, unspecified: Secondary | ICD-10-CM | POA: Diagnosis not present

## 2021-11-28 DIAGNOSIS — E1122 Type 2 diabetes mellitus with diabetic chronic kidney disease: Secondary | ICD-10-CM | POA: Diagnosis not present

## 2021-11-28 DIAGNOSIS — E1143 Type 2 diabetes mellitus with diabetic autonomic (poly)neuropathy: Secondary | ICD-10-CM | POA: Diagnosis not present

## 2021-11-28 DIAGNOSIS — I951 Orthostatic hypotension: Secondary | ICD-10-CM | POA: Diagnosis not present

## 2021-11-28 DIAGNOSIS — K3184 Gastroparesis: Secondary | ICD-10-CM | POA: Diagnosis not present

## 2021-11-28 DIAGNOSIS — I8500 Esophageal varices without bleeding: Secondary | ICD-10-CM | POA: Diagnosis not present

## 2021-11-28 DIAGNOSIS — I2510 Atherosclerotic heart disease of native coronary artery without angina pectoris: Secondary | ICD-10-CM | POA: Diagnosis not present

## 2021-11-28 DIAGNOSIS — I129 Hypertensive chronic kidney disease with stage 1 through stage 4 chronic kidney disease, or unspecified chronic kidney disease: Secondary | ICD-10-CM | POA: Diagnosis not present

## 2021-11-28 DIAGNOSIS — D367 Benign neoplasm of other specified sites: Secondary | ICD-10-CM | POA: Diagnosis not present

## 2021-11-28 DIAGNOSIS — D500 Iron deficiency anemia secondary to blood loss (chronic): Secondary | ICD-10-CM | POA: Diagnosis not present

## 2021-11-28 DIAGNOSIS — N3001 Acute cystitis with hematuria: Secondary | ICD-10-CM | POA: Diagnosis not present

## 2021-11-28 DIAGNOSIS — E43 Unspecified severe protein-calorie malnutrition: Secondary | ICD-10-CM | POA: Diagnosis not present

## 2021-11-28 DIAGNOSIS — I480 Paroxysmal atrial fibrillation: Secondary | ICD-10-CM | POA: Diagnosis not present

## 2021-11-28 DIAGNOSIS — K7469 Other cirrhosis of liver: Secondary | ICD-10-CM | POA: Diagnosis not present

## 2021-11-28 DIAGNOSIS — E1140 Type 2 diabetes mellitus with diabetic neuropathy, unspecified: Secondary | ICD-10-CM | POA: Diagnosis not present

## 2021-11-28 DIAGNOSIS — M47816 Spondylosis without myelopathy or radiculopathy, lumbar region: Secondary | ICD-10-CM | POA: Diagnosis not present

## 2021-11-28 DIAGNOSIS — N183 Chronic kidney disease, stage 3 unspecified: Secondary | ICD-10-CM | POA: Diagnosis not present

## 2021-11-28 DIAGNOSIS — K5730 Diverticulosis of large intestine without perforation or abscess without bleeding: Secondary | ICD-10-CM | POA: Diagnosis not present

## 2021-11-28 DIAGNOSIS — G894 Chronic pain syndrome: Secondary | ICD-10-CM | POA: Diagnosis not present

## 2021-11-28 DIAGNOSIS — M797 Fibromyalgia: Secondary | ICD-10-CM | POA: Diagnosis not present

## 2021-11-28 DIAGNOSIS — M5136 Other intervertebral disc degeneration, lumbar region: Secondary | ICD-10-CM | POA: Diagnosis not present

## 2021-11-28 DIAGNOSIS — K7581 Nonalcoholic steatohepatitis (NASH): Secondary | ICD-10-CM | POA: Diagnosis not present

## 2021-11-28 DIAGNOSIS — I420 Dilated cardiomyopathy: Secondary | ICD-10-CM | POA: Diagnosis not present

## 2021-11-28 DIAGNOSIS — F5101 Primary insomnia: Secondary | ICD-10-CM | POA: Diagnosis not present

## 2021-11-28 DIAGNOSIS — M47812 Spondylosis without myelopathy or radiculopathy, cervical region: Secondary | ICD-10-CM | POA: Diagnosis not present

## 2021-11-29 DIAGNOSIS — I951 Orthostatic hypotension: Secondary | ICD-10-CM | POA: Diagnosis not present

## 2021-11-29 DIAGNOSIS — K5730 Diverticulosis of large intestine without perforation or abscess without bleeding: Secondary | ICD-10-CM | POA: Diagnosis not present

## 2021-11-29 DIAGNOSIS — I420 Dilated cardiomyopathy: Secondary | ICD-10-CM | POA: Diagnosis not present

## 2021-11-29 DIAGNOSIS — E1143 Type 2 diabetes mellitus with diabetic autonomic (poly)neuropathy: Secondary | ICD-10-CM | POA: Diagnosis not present

## 2021-11-29 DIAGNOSIS — I129 Hypertensive chronic kidney disease with stage 1 through stage 4 chronic kidney disease, or unspecified chronic kidney disease: Secondary | ICD-10-CM | POA: Diagnosis not present

## 2021-11-29 DIAGNOSIS — D500 Iron deficiency anemia secondary to blood loss (chronic): Secondary | ICD-10-CM | POA: Diagnosis not present

## 2021-11-29 DIAGNOSIS — E1140 Type 2 diabetes mellitus with diabetic neuropathy, unspecified: Secondary | ICD-10-CM | POA: Diagnosis not present

## 2021-11-29 DIAGNOSIS — K7469 Other cirrhosis of liver: Secondary | ICD-10-CM | POA: Diagnosis not present

## 2021-11-29 DIAGNOSIS — D367 Benign neoplasm of other specified sites: Secondary | ICD-10-CM | POA: Diagnosis not present

## 2021-11-29 DIAGNOSIS — K3184 Gastroparesis: Secondary | ICD-10-CM | POA: Diagnosis not present

## 2021-11-29 DIAGNOSIS — M47812 Spondylosis without myelopathy or radiculopathy, cervical region: Secondary | ICD-10-CM | POA: Diagnosis not present

## 2021-11-29 DIAGNOSIS — M797 Fibromyalgia: Secondary | ICD-10-CM | POA: Diagnosis not present

## 2021-11-29 DIAGNOSIS — I8500 Esophageal varices without bleeding: Secondary | ICD-10-CM | POA: Diagnosis not present

## 2021-11-29 DIAGNOSIS — E162 Hypoglycemia, unspecified: Secondary | ICD-10-CM | POA: Diagnosis not present

## 2021-11-29 DIAGNOSIS — E43 Unspecified severe protein-calorie malnutrition: Secondary | ICD-10-CM | POA: Diagnosis not present

## 2021-11-29 DIAGNOSIS — N179 Acute kidney failure, unspecified: Secondary | ICD-10-CM | POA: Diagnosis not present

## 2021-11-29 DIAGNOSIS — I480 Paroxysmal atrial fibrillation: Secondary | ICD-10-CM | POA: Diagnosis not present

## 2021-11-29 DIAGNOSIS — M5136 Other intervertebral disc degeneration, lumbar region: Secondary | ICD-10-CM | POA: Diagnosis not present

## 2021-11-29 DIAGNOSIS — I2510 Atherosclerotic heart disease of native coronary artery without angina pectoris: Secondary | ICD-10-CM | POA: Diagnosis not present

## 2021-11-29 DIAGNOSIS — I4891 Unspecified atrial fibrillation: Secondary | ICD-10-CM | POA: Diagnosis not present

## 2021-11-29 DIAGNOSIS — M47816 Spondylosis without myelopathy or radiculopathy, lumbar region: Secondary | ICD-10-CM | POA: Diagnosis not present

## 2021-11-29 DIAGNOSIS — E1122 Type 2 diabetes mellitus with diabetic chronic kidney disease: Secondary | ICD-10-CM | POA: Diagnosis not present

## 2021-11-29 DIAGNOSIS — N183 Chronic kidney disease, stage 3 unspecified: Secondary | ICD-10-CM | POA: Diagnosis not present

## 2021-11-29 DIAGNOSIS — N3001 Acute cystitis with hematuria: Secondary | ICD-10-CM | POA: Diagnosis not present

## 2021-11-29 DIAGNOSIS — G894 Chronic pain syndrome: Secondary | ICD-10-CM | POA: Diagnosis not present

## 2021-11-29 DIAGNOSIS — K7581 Nonalcoholic steatohepatitis (NASH): Secondary | ICD-10-CM | POA: Diagnosis not present

## 2021-11-29 DIAGNOSIS — F5101 Primary insomnia: Secondary | ICD-10-CM | POA: Diagnosis not present

## 2021-12-06 DIAGNOSIS — R42 Dizziness and giddiness: Secondary | ICD-10-CM | POA: Diagnosis not present

## 2021-12-06 DIAGNOSIS — R109 Unspecified abdominal pain: Secondary | ICD-10-CM | POA: Diagnosis not present

## 2021-12-06 DIAGNOSIS — Z8673 Personal history of transient ischemic attack (TIA), and cerebral infarction without residual deficits: Secondary | ICD-10-CM | POA: Diagnosis not present

## 2021-12-06 DIAGNOSIS — G8929 Other chronic pain: Secondary | ICD-10-CM | POA: Diagnosis not present

## 2021-12-06 DIAGNOSIS — R531 Weakness: Secondary | ICD-10-CM | POA: Diagnosis not present

## 2021-12-06 DIAGNOSIS — R0689 Other abnormalities of breathing: Secondary | ICD-10-CM | POA: Diagnosis not present

## 2021-12-06 DIAGNOSIS — R1084 Generalized abdominal pain: Secondary | ICD-10-CM | POA: Diagnosis not present

## 2021-12-06 DIAGNOSIS — E1043 Type 1 diabetes mellitus with diabetic autonomic (poly)neuropathy: Secondary | ICD-10-CM | POA: Diagnosis not present

## 2021-12-06 DIAGNOSIS — R112 Nausea with vomiting, unspecified: Secondary | ICD-10-CM | POA: Diagnosis not present

## 2021-12-06 DIAGNOSIS — I252 Old myocardial infarction: Secondary | ICD-10-CM | POA: Diagnosis not present

## 2021-12-06 DIAGNOSIS — R161 Splenomegaly, not elsewhere classified: Secondary | ICD-10-CM | POA: Diagnosis not present

## 2021-12-06 DIAGNOSIS — K3184 Gastroparesis: Secondary | ICD-10-CM | POA: Diagnosis not present

## 2021-12-06 DIAGNOSIS — Z887 Allergy status to serum and vaccine status: Secondary | ICD-10-CM | POA: Diagnosis not present

## 2021-12-06 DIAGNOSIS — Z886 Allergy status to analgesic agent status: Secondary | ICD-10-CM | POA: Diagnosis not present

## 2021-12-06 DIAGNOSIS — Z881 Allergy status to other antibiotic agents status: Secondary | ICD-10-CM | POA: Diagnosis not present

## 2021-12-06 DIAGNOSIS — Z885 Allergy status to narcotic agent status: Secondary | ICD-10-CM | POA: Diagnosis not present

## 2021-12-06 DIAGNOSIS — Z79899 Other long term (current) drug therapy: Secondary | ICD-10-CM | POA: Diagnosis not present

## 2021-12-06 DIAGNOSIS — Z794 Long term (current) use of insulin: Secondary | ICD-10-CM | POA: Diagnosis not present

## 2021-12-06 DIAGNOSIS — R634 Abnormal weight loss: Secondary | ICD-10-CM | POA: Diagnosis not present

## 2021-12-06 DIAGNOSIS — M549 Dorsalgia, unspecified: Secondary | ICD-10-CM | POA: Diagnosis not present

## 2021-12-06 DIAGNOSIS — Z888 Allergy status to other drugs, medicaments and biological substances status: Secondary | ICD-10-CM | POA: Diagnosis not present

## 2021-12-06 DIAGNOSIS — I480 Paroxysmal atrial fibrillation: Secondary | ICD-10-CM | POA: Diagnosis not present

## 2021-12-06 DIAGNOSIS — R0789 Other chest pain: Secondary | ICD-10-CM | POA: Diagnosis not present

## 2021-12-06 DIAGNOSIS — R000 Tachycardia, unspecified: Secondary | ICD-10-CM | POA: Diagnosis not present

## 2021-12-07 DIAGNOSIS — R42 Dizziness and giddiness: Secondary | ICD-10-CM | POA: Diagnosis not present

## 2021-12-07 DIAGNOSIS — R0789 Other chest pain: Secondary | ICD-10-CM | POA: Diagnosis not present

## 2021-12-07 DIAGNOSIS — R109 Unspecified abdominal pain: Secondary | ICD-10-CM | POA: Diagnosis not present

## 2021-12-14 DIAGNOSIS — Z20822 Contact with and (suspected) exposure to covid-19: Secondary | ICD-10-CM | POA: Diagnosis not present

## 2021-12-14 DIAGNOSIS — F32A Depression, unspecified: Secondary | ICD-10-CM | POA: Diagnosis not present

## 2021-12-14 DIAGNOSIS — K3184 Gastroparesis: Secondary | ICD-10-CM | POA: Diagnosis not present

## 2021-12-14 DIAGNOSIS — R61 Generalized hyperhidrosis: Secondary | ICD-10-CM | POA: Diagnosis not present

## 2021-12-14 DIAGNOSIS — E10649 Type 1 diabetes mellitus with hypoglycemia without coma: Secondary | ICD-10-CM | POA: Diagnosis not present

## 2021-12-14 DIAGNOSIS — I8510 Secondary esophageal varices without bleeding: Secondary | ICD-10-CM | POA: Diagnosis not present

## 2021-12-14 DIAGNOSIS — E1043 Type 1 diabetes mellitus with diabetic autonomic (poly)neuropathy: Secondary | ICD-10-CM | POA: Diagnosis not present

## 2021-12-14 DIAGNOSIS — E860 Dehydration: Secondary | ICD-10-CM | POA: Diagnosis not present

## 2021-12-14 DIAGNOSIS — I2510 Atherosclerotic heart disease of native coronary artery without angina pectoris: Secondary | ICD-10-CM | POA: Diagnosis not present

## 2021-12-14 DIAGNOSIS — Z79899 Other long term (current) drug therapy: Secondary | ICD-10-CM | POA: Diagnosis not present

## 2021-12-14 DIAGNOSIS — I440 Atrioventricular block, first degree: Secondary | ICD-10-CM | POA: Diagnosis not present

## 2021-12-14 DIAGNOSIS — E162 Hypoglycemia, unspecified: Secondary | ICD-10-CM | POA: Diagnosis not present

## 2021-12-14 DIAGNOSIS — I252 Old myocardial infarction: Secondary | ICD-10-CM | POA: Diagnosis not present

## 2021-12-14 DIAGNOSIS — I480 Paroxysmal atrial fibrillation: Secondary | ICD-10-CM | POA: Diagnosis not present

## 2021-12-14 DIAGNOSIS — R109 Unspecified abdominal pain: Secondary | ICD-10-CM | POA: Diagnosis not present

## 2021-12-14 DIAGNOSIS — T68XXXA Hypothermia, initial encounter: Secondary | ICD-10-CM | POA: Diagnosis not present

## 2021-12-14 DIAGNOSIS — I10 Essential (primary) hypertension: Secondary | ICD-10-CM | POA: Diagnosis not present

## 2021-12-14 DIAGNOSIS — R410 Disorientation, unspecified: Secondary | ICD-10-CM | POA: Diagnosis not present

## 2021-12-14 DIAGNOSIS — K766 Portal hypertension: Secondary | ICD-10-CM | POA: Diagnosis not present

## 2021-12-14 DIAGNOSIS — E038 Other specified hypothyroidism: Secondary | ICD-10-CM | POA: Diagnosis not present

## 2021-12-14 DIAGNOSIS — K7682 Hepatic encephalopathy: Secondary | ICD-10-CM | POA: Diagnosis not present

## 2021-12-14 DIAGNOSIS — E785 Hyperlipidemia, unspecified: Secondary | ICD-10-CM | POA: Diagnosis not present

## 2021-12-14 DIAGNOSIS — M545 Low back pain, unspecified: Secondary | ICD-10-CM | POA: Diagnosis not present

## 2021-12-14 DIAGNOSIS — K7469 Other cirrhosis of liver: Secondary | ICD-10-CM | POA: Diagnosis not present

## 2021-12-14 DIAGNOSIS — Z794 Long term (current) use of insulin: Secondary | ICD-10-CM | POA: Diagnosis not present

## 2021-12-14 DIAGNOSIS — K5790 Diverticulosis of intestine, part unspecified, without perforation or abscess without bleeding: Secondary | ICD-10-CM | POA: Diagnosis not present

## 2021-12-14 DIAGNOSIS — S0990XA Unspecified injury of head, initial encounter: Secondary | ICD-10-CM | POA: Diagnosis not present

## 2021-12-14 DIAGNOSIS — K7581 Nonalcoholic steatohepatitis (NASH): Secondary | ICD-10-CM | POA: Diagnosis not present

## 2021-12-14 DIAGNOSIS — Z8673 Personal history of transient ischemic attack (TIA), and cerebral infarction without residual deficits: Secondary | ICD-10-CM | POA: Diagnosis not present

## 2021-12-14 DIAGNOSIS — E039 Hypothyroidism, unspecified: Secondary | ICD-10-CM | POA: Diagnosis not present

## 2021-12-14 DIAGNOSIS — N3000 Acute cystitis without hematuria: Secondary | ICD-10-CM | POA: Diagnosis not present

## 2021-12-14 DIAGNOSIS — N179 Acute kidney failure, unspecified: Secondary | ICD-10-CM | POA: Diagnosis not present

## 2021-12-16 IMAGING — CT CT HEAD CODE STROKE
4 series · 17 of 47 positions shown, 19 images · non-contrast
Comparison: None.

CLINICAL DATA: Code stroke.  Left-sided weakness

EXAM:
CT HEAD WITHOUT CONTRAST
TECHNIQUE: Contiguous axial images were obtained from the base of the skull
through the vertex without intravenous contrast.

[Series 3: head wo · axial · 0.42mm/px · z∈[-204,-74]mm · 7 of 36 slices shown, 9 images]
[im 5/36  brain]
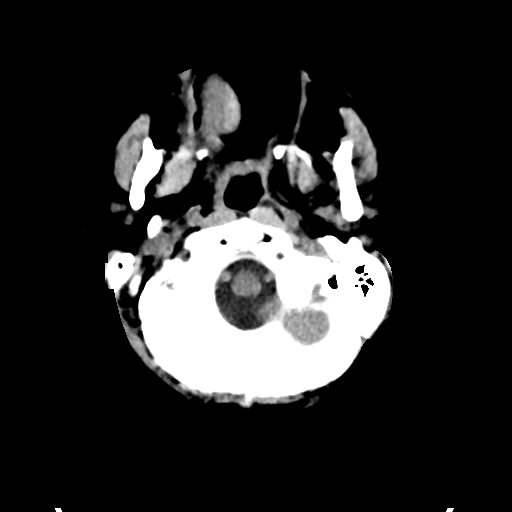
[im 5/36  bone]
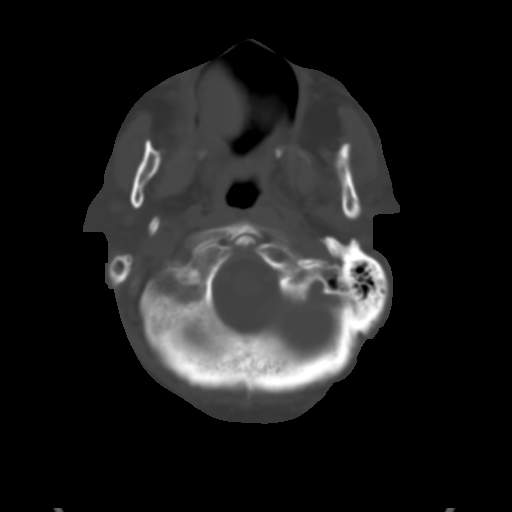
[im 9/36  brain]
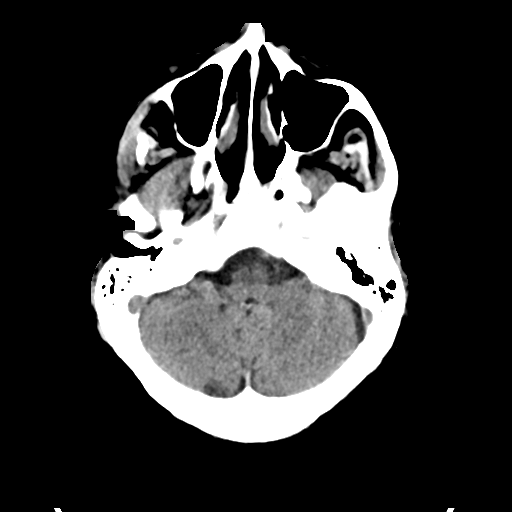
[im 14/36  brain]
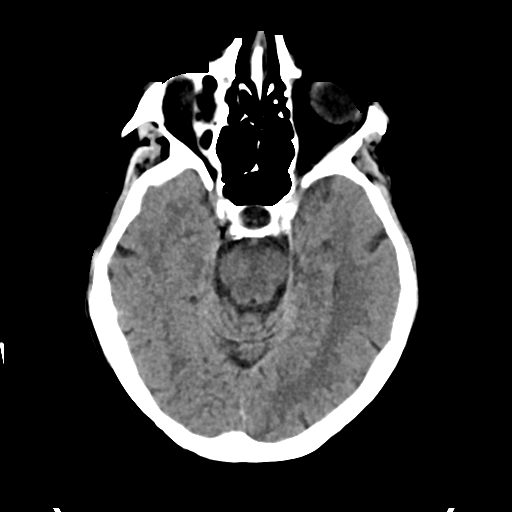
[im 18/36  brain]
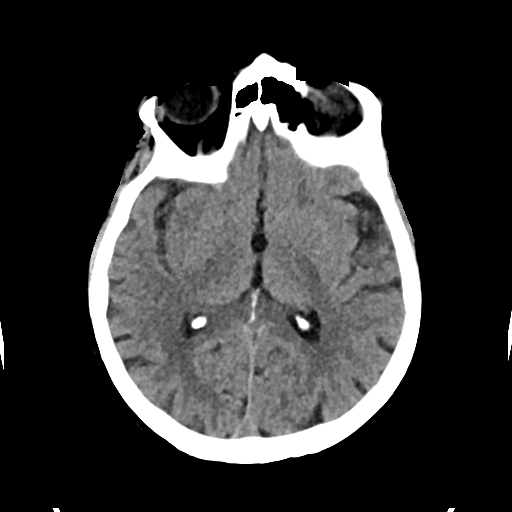
[im 22/36  brain]
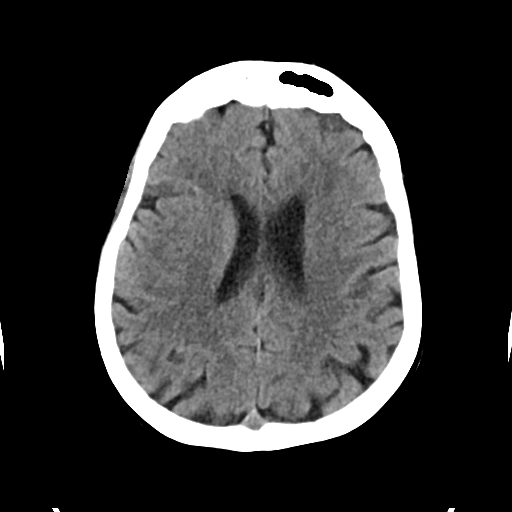
[im 22/36  bone]
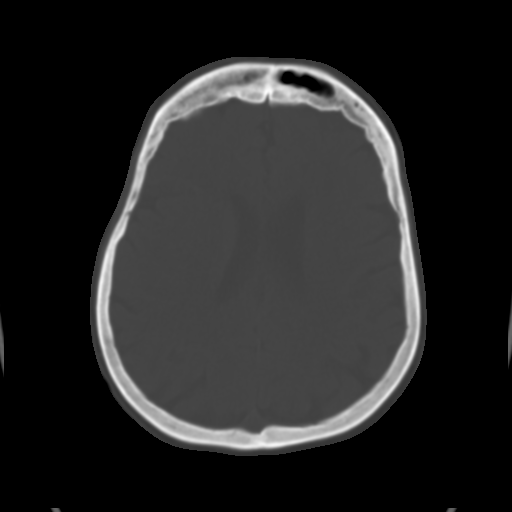
[im 27/36  brain]
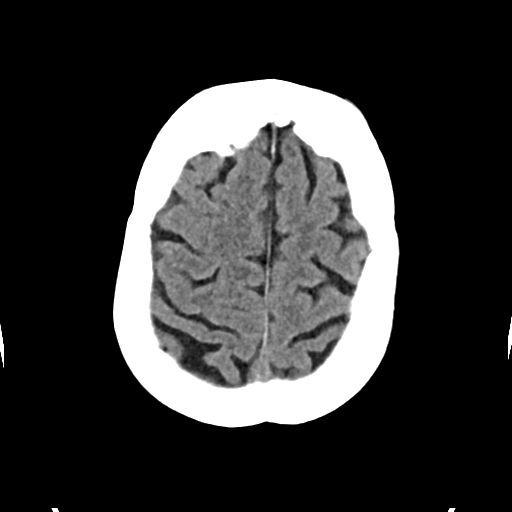
[im 31/36  brain]
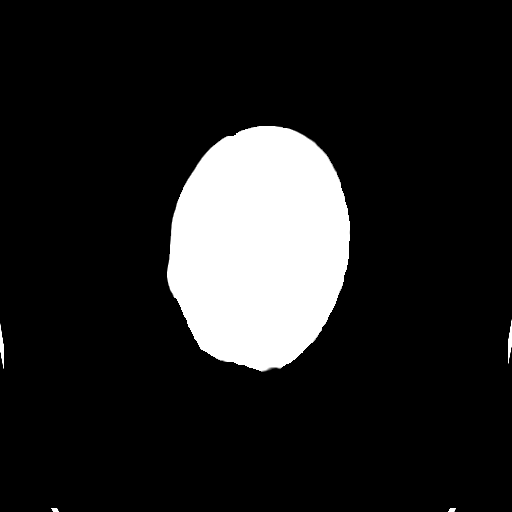

[Series 4: head bone · axial · 0.42mm/px · z∈[-208,-146]mm · 4 of 88 slices shown]
[im 9/88  bone]
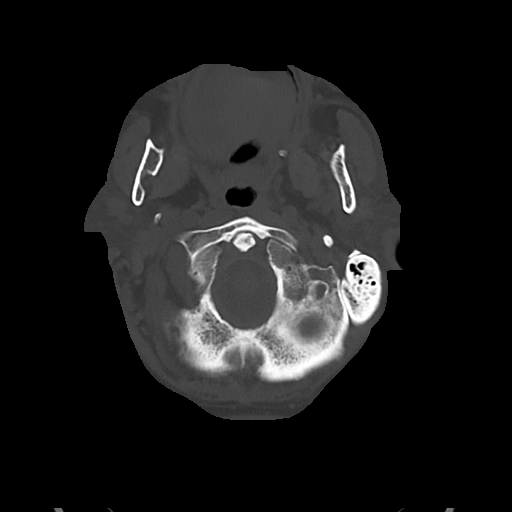
[im 18/88  bone]
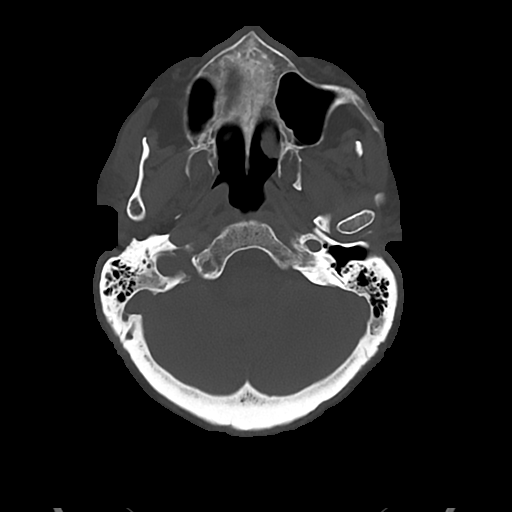
[im 27/88  bone]
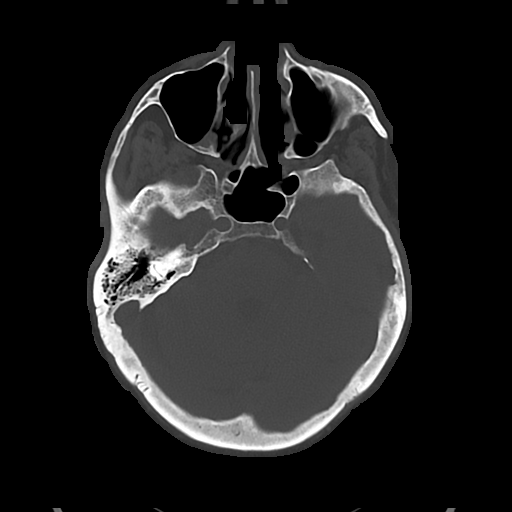
[im 40/88  bone]
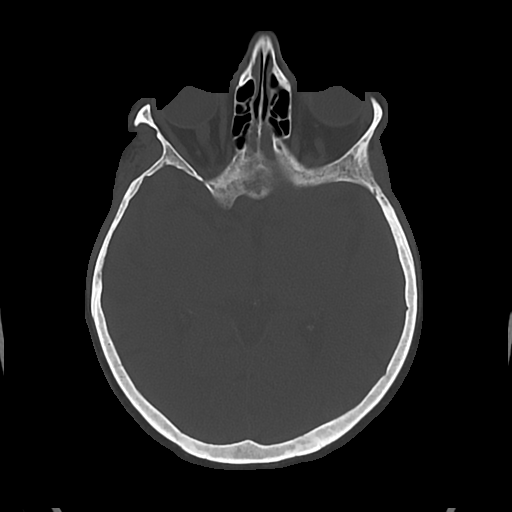

[Series 5: cor soft · coronal · 0.32mm/px · 3 of 65 slices shown]
[im 22/65  brain]
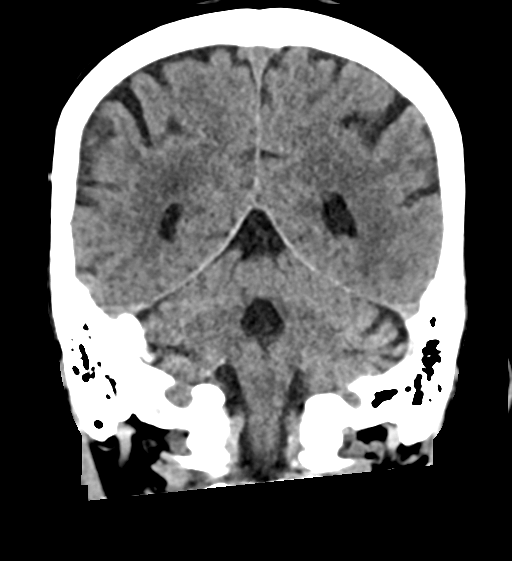
[im 29/65  brain]
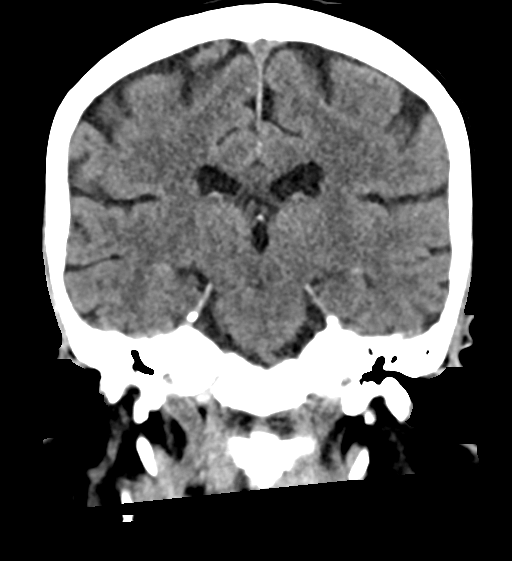
[im 36/65  brain]
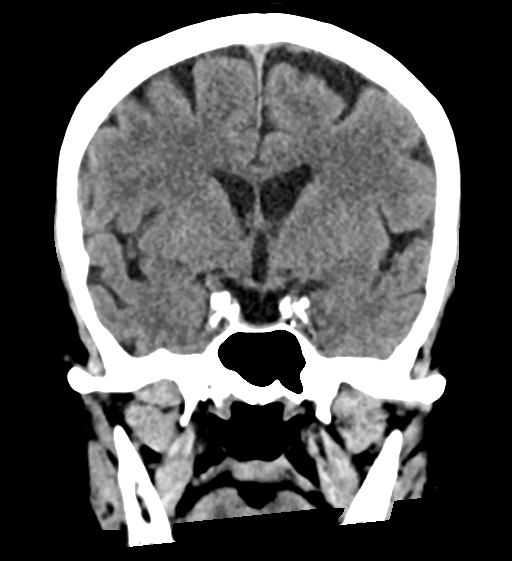

[Series 6: sag soft · sagittal · 0.36mm/px · 3 of 56 slices shown]
[im 19/56  brain]
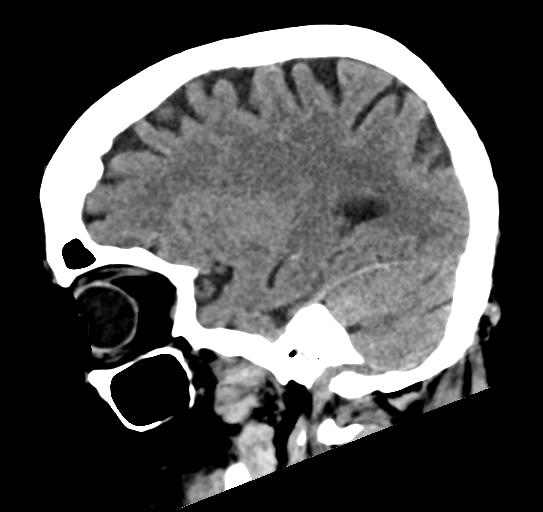
[im 28/56  brain]
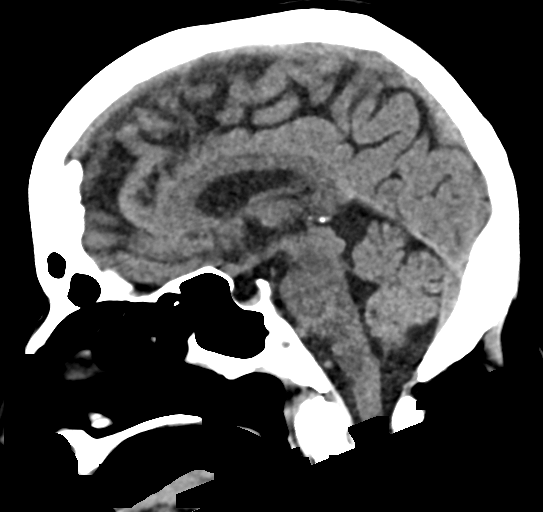
[im 37/56  brain]
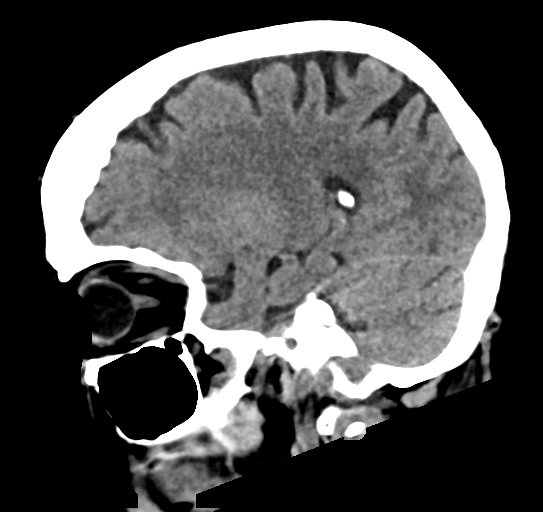

[17 of 47 positions shown; findings below may reference images not displayed]

FINDINGS: Brain: There is no mass, hemorrhage or extra-axial collection. The
size and configuration of the ventricles and extra-axial CSF spaces
are normal. There is hypoattenuation of the periventricular white
matter, most commonly indicating chronic ischemic microangiopathy.

Vascular: No abnormal hyperdensity of the major intracranial
arteries or dural venous sinuses. No intracranial atherosclerosis.

Skull: The visualized skull base, calvarium and extracranial soft
tissues are normal.

Sinuses/Orbits: No fluid levels or advanced mucosal thickening of
the visualized paranasal sinuses. No mastoid or middle ear effusion.
The orbits are normal.

ASPECTS (Alberta Stroke Program Early CT Score)

- Ganglionic level infarction (caudate, lentiform nuclei, internal
capsule, insula, M1-M3 cortex): 7

- Supraganglionic infarction (M4-M6 cortex): 3

Total score (0-10 with 10 being normal): 10
IMPRESSION: 1. No acute abnormality.
2. ASPECTS is 10.
* These results were communicated to Dr. Almir Eldina Errath at [DATE]
on 01/23/2020 by text page via the [employer] messaging system.

## 2021-12-16 IMAGING — DX DG CHEST 1V PORT
1 series · 1 of 1 positions shown · non-contrast
Comparison: November 23, 2019

CLINICAL DATA: Weakness and hypotension

EXAM:
PORTABLE CHEST 1 VIEW

[chest ap]
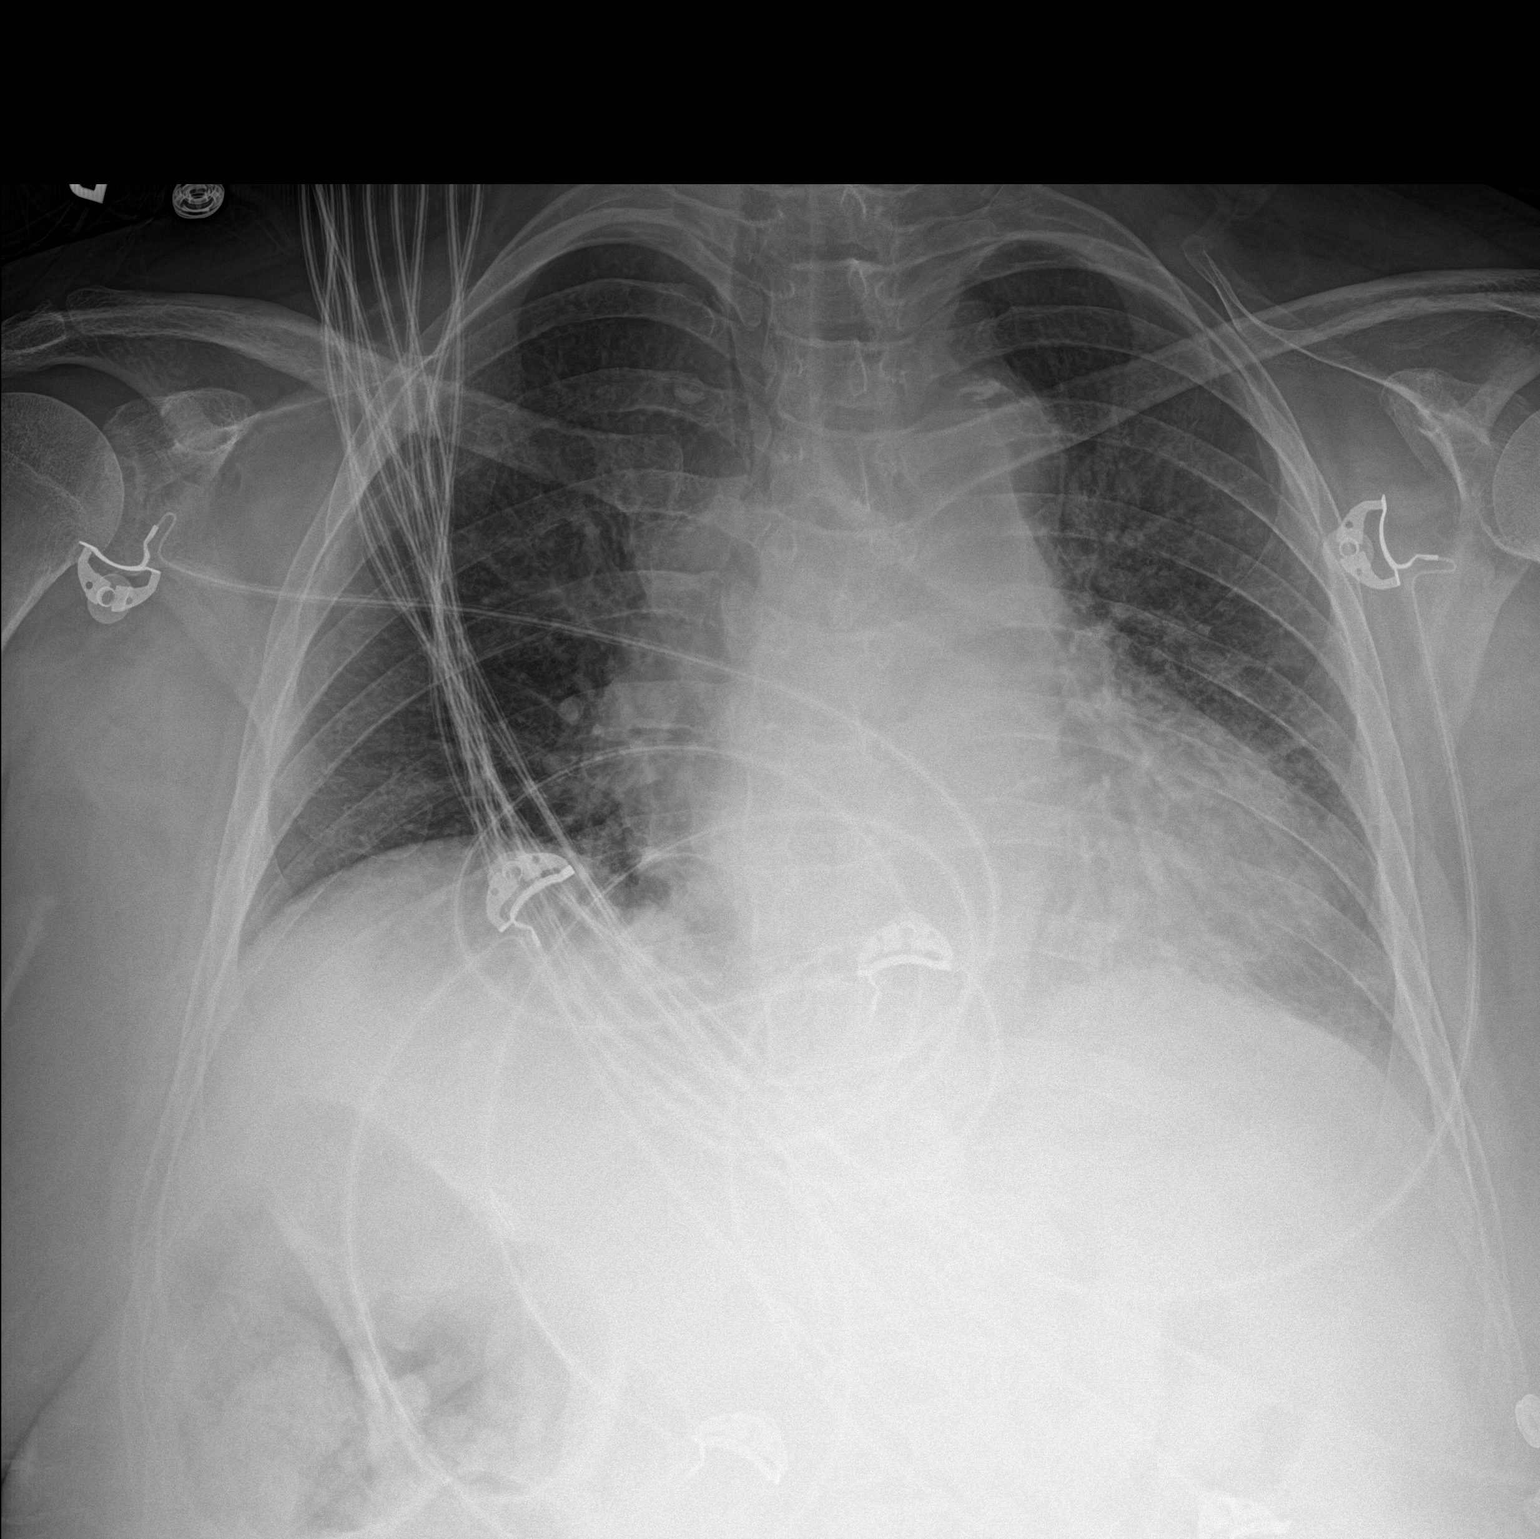

[1 of 1 positions shown; findings below may reference images not displayed]

FINDINGS: There is unchanged cardiomegaly. Aortic knob calcifications. There
is mild prominence of the central pulmonary vasculature. No large
airspace consolidation or pleural effusion.
IMPRESSION: Mild cardiomegaly and pulmonary vascular congestion

## 2021-12-22 DIAGNOSIS — N261 Atrophy of kidney (terminal): Secondary | ICD-10-CM | POA: Diagnosis not present

## 2021-12-22 DIAGNOSIS — R161 Splenomegaly, not elsewhere classified: Secondary | ICD-10-CM | POA: Diagnosis not present

## 2021-12-22 DIAGNOSIS — G894 Chronic pain syndrome: Secondary | ICD-10-CM | POA: Diagnosis not present

## 2021-12-22 DIAGNOSIS — Z9289 Personal history of other medical treatment: Secondary | ICD-10-CM | POA: Diagnosis not present

## 2021-12-22 DIAGNOSIS — K7581 Nonalcoholic steatohepatitis (NASH): Secondary | ICD-10-CM | POA: Diagnosis not present

## 2021-12-22 DIAGNOSIS — E1069 Type 1 diabetes mellitus with other specified complication: Secondary | ICD-10-CM | POA: Diagnosis not present

## 2021-12-22 DIAGNOSIS — R64 Cachexia: Secondary | ICD-10-CM | POA: Diagnosis not present

## 2021-12-22 DIAGNOSIS — R410 Disorientation, unspecified: Secondary | ICD-10-CM | POA: Diagnosis not present

## 2021-12-22 DIAGNOSIS — I10 Essential (primary) hypertension: Secondary | ICD-10-CM | POA: Diagnosis not present

## 2021-12-22 DIAGNOSIS — K766 Portal hypertension: Secondary | ICD-10-CM | POA: Diagnosis not present

## 2021-12-28 DIAGNOSIS — T68XXXD Hypothermia, subsequent encounter: Secondary | ICD-10-CM | POA: Diagnosis not present

## 2021-12-28 DIAGNOSIS — Z792 Long term (current) use of antibiotics: Secondary | ICD-10-CM | POA: Diagnosis not present

## 2021-12-28 DIAGNOSIS — T68XXXA Hypothermia, initial encounter: Secondary | ICD-10-CM | POA: Diagnosis not present

## 2021-12-28 DIAGNOSIS — E876 Hypokalemia: Secondary | ICD-10-CM | POA: Diagnosis not present

## 2021-12-28 DIAGNOSIS — Z20822 Contact with and (suspected) exposure to covid-19: Secondary | ICD-10-CM | POA: Diagnosis not present

## 2021-12-28 DIAGNOSIS — R946 Abnormal results of thyroid function studies: Secondary | ICD-10-CM | POA: Diagnosis not present

## 2021-12-28 DIAGNOSIS — M47816 Spondylosis without myelopathy or radiculopathy, lumbar region: Secondary | ICD-10-CM | POA: Diagnosis not present

## 2021-12-28 DIAGNOSIS — Z794 Long term (current) use of insulin: Secondary | ICD-10-CM | POA: Diagnosis not present

## 2021-12-28 DIAGNOSIS — I10 Essential (primary) hypertension: Secondary | ICD-10-CM | POA: Diagnosis not present

## 2021-12-28 DIAGNOSIS — R6889 Other general symptoms and signs: Secondary | ICD-10-CM | POA: Diagnosis not present

## 2021-12-28 DIAGNOSIS — K7460 Unspecified cirrhosis of liver: Secondary | ICD-10-CM | POA: Diagnosis not present

## 2021-12-28 DIAGNOSIS — D61818 Other pancytopenia: Secondary | ICD-10-CM | POA: Diagnosis not present

## 2021-12-28 DIAGNOSIS — D72819 Decreased white blood cell count, unspecified: Secondary | ICD-10-CM | POA: Diagnosis not present

## 2021-12-28 DIAGNOSIS — K7682 Hepatic encephalopathy: Secondary | ICD-10-CM | POA: Diagnosis not present

## 2021-12-28 DIAGNOSIS — E161 Other hypoglycemia: Secondary | ICD-10-CM | POA: Diagnosis not present

## 2021-12-28 DIAGNOSIS — N3001 Acute cystitis with hematuria: Secondary | ICD-10-CM | POA: Diagnosis not present

## 2021-12-28 DIAGNOSIS — Z79899 Other long term (current) drug therapy: Secondary | ICD-10-CM | POA: Diagnosis not present

## 2021-12-28 DIAGNOSIS — R410 Disorientation, unspecified: Secondary | ICD-10-CM | POA: Diagnosis not present

## 2021-12-28 DIAGNOSIS — R680 Hypothermia, not associated with low environmental temperature: Secondary | ICD-10-CM | POA: Diagnosis not present

## 2021-12-28 DIAGNOSIS — Z743 Need for continuous supervision: Secondary | ICD-10-CM | POA: Diagnosis not present

## 2021-12-28 DIAGNOSIS — E11649 Type 2 diabetes mellitus with hypoglycemia without coma: Secondary | ICD-10-CM | POA: Diagnosis not present

## 2021-12-28 DIAGNOSIS — Z87891 Personal history of nicotine dependence: Secondary | ICD-10-CM | POA: Diagnosis not present

## 2021-12-28 DIAGNOSIS — K219 Gastro-esophageal reflux disease without esophagitis: Secondary | ICD-10-CM | POA: Diagnosis not present

## 2021-12-28 DIAGNOSIS — G9341 Metabolic encephalopathy: Secondary | ICD-10-CM | POA: Diagnosis not present

## 2021-12-28 DIAGNOSIS — D696 Thrombocytopenia, unspecified: Secondary | ICD-10-CM | POA: Diagnosis not present

## 2021-12-28 DIAGNOSIS — K7581 Nonalcoholic steatohepatitis (NASH): Secondary | ICD-10-CM | POA: Diagnosis not present

## 2021-12-30 DIAGNOSIS — I10 Essential (primary) hypertension: Secondary | ICD-10-CM | POA: Diagnosis not present

## 2021-12-30 DIAGNOSIS — E11649 Type 2 diabetes mellitus with hypoglycemia without coma: Secondary | ICD-10-CM | POA: Diagnosis not present

## 2021-12-30 DIAGNOSIS — E1069 Type 1 diabetes mellitus with other specified complication: Secondary | ICD-10-CM | POA: Diagnosis not present

## 2021-12-30 DIAGNOSIS — E162 Hypoglycemia, unspecified: Secondary | ICD-10-CM | POA: Diagnosis not present

## 2021-12-30 DIAGNOSIS — N261 Atrophy of kidney (terminal): Secondary | ICD-10-CM | POA: Diagnosis not present

## 2021-12-30 DIAGNOSIS — I4891 Unspecified atrial fibrillation: Secondary | ICD-10-CM | POA: Diagnosis not present

## 2021-12-30 DIAGNOSIS — I480 Paroxysmal atrial fibrillation: Secondary | ICD-10-CM | POA: Diagnosis not present

## 2022-01-05 DIAGNOSIS — M7989 Other specified soft tissue disorders: Secondary | ICD-10-CM | POA: Diagnosis not present

## 2022-01-05 DIAGNOSIS — K766 Portal hypertension: Secondary | ICD-10-CM | POA: Diagnosis not present

## 2022-01-05 DIAGNOSIS — G894 Chronic pain syndrome: Secondary | ICD-10-CM | POA: Diagnosis not present

## 2022-01-05 DIAGNOSIS — I50810 Right heart failure, unspecified: Secondary | ICD-10-CM | POA: Diagnosis not present

## 2022-01-05 DIAGNOSIS — R410 Disorientation, unspecified: Secondary | ICD-10-CM | POA: Diagnosis not present

## 2022-01-05 DIAGNOSIS — E11649 Type 2 diabetes mellitus with hypoglycemia without coma: Secondary | ICD-10-CM | POA: Diagnosis not present

## 2022-01-05 DIAGNOSIS — Z7689 Persons encountering health services in other specified circumstances: Secondary | ICD-10-CM | POA: Diagnosis not present

## 2022-01-05 DIAGNOSIS — K7469 Other cirrhosis of liver: Secondary | ICD-10-CM | POA: Diagnosis not present

## 2022-01-05 DIAGNOSIS — Z9289 Personal history of other medical treatment: Secondary | ICD-10-CM | POA: Diagnosis not present

## 2022-01-05 DIAGNOSIS — K7581 Nonalcoholic steatohepatitis (NASH): Secondary | ICD-10-CM | POA: Diagnosis not present

## 2022-01-12 DIAGNOSIS — E039 Hypothyroidism, unspecified: Secondary | ICD-10-CM | POA: Diagnosis not present

## 2022-01-12 DIAGNOSIS — Z8673 Personal history of transient ischemic attack (TIA), and cerebral infarction without residual deficits: Secondary | ICD-10-CM | POA: Diagnosis not present

## 2022-01-12 DIAGNOSIS — E139 Other specified diabetes mellitus without complications: Secondary | ICD-10-CM | POA: Diagnosis not present

## 2022-01-12 DIAGNOSIS — Z1389 Encounter for screening for other disorder: Secondary | ICD-10-CM | POA: Diagnosis not present

## 2022-01-12 DIAGNOSIS — K529 Noninfective gastroenteritis and colitis, unspecified: Secondary | ICD-10-CM | POA: Diagnosis not present

## 2022-01-12 DIAGNOSIS — Z66 Do not resuscitate: Secondary | ICD-10-CM | POA: Diagnosis not present

## 2022-01-12 DIAGNOSIS — T4275XA Adverse effect of unspecified antiepileptic and sedative-hypnotic drugs, initial encounter: Secondary | ICD-10-CM | POA: Diagnosis not present

## 2022-01-12 DIAGNOSIS — Z794 Long term (current) use of insulin: Secondary | ICD-10-CM | POA: Diagnosis not present

## 2022-01-12 DIAGNOSIS — K7581 Nonalcoholic steatohepatitis (NASH): Secondary | ICD-10-CM | POA: Diagnosis not present

## 2022-01-12 DIAGNOSIS — R8271 Bacteriuria: Secondary | ICD-10-CM | POA: Diagnosis not present

## 2022-01-12 DIAGNOSIS — I252 Old myocardial infarction: Secondary | ICD-10-CM | POA: Diagnosis not present

## 2022-01-12 DIAGNOSIS — R531 Weakness: Secondary | ICD-10-CM | POA: Diagnosis not present

## 2022-01-12 DIAGNOSIS — N390 Urinary tract infection, site not specified: Secondary | ICD-10-CM | POA: Diagnosis not present

## 2022-01-12 DIAGNOSIS — K3184 Gastroparesis: Secondary | ICD-10-CM | POA: Diagnosis not present

## 2022-01-12 DIAGNOSIS — Z87440 Personal history of urinary (tract) infections: Secondary | ICD-10-CM | POA: Diagnosis not present

## 2022-01-12 DIAGNOSIS — K7460 Unspecified cirrhosis of liver: Secondary | ICD-10-CM | POA: Diagnosis not present

## 2022-01-12 DIAGNOSIS — I8510 Secondary esophageal varices without bleeding: Secondary | ICD-10-CM | POA: Diagnosis not present

## 2022-01-12 DIAGNOSIS — Z8711 Personal history of peptic ulcer disease: Secondary | ICD-10-CM | POA: Diagnosis not present

## 2022-01-12 DIAGNOSIS — E1043 Type 1 diabetes mellitus with diabetic autonomic (poly)neuropathy: Secondary | ICD-10-CM | POA: Diagnosis not present

## 2022-01-12 DIAGNOSIS — I129 Hypertensive chronic kidney disease with stage 1 through stage 4 chronic kidney disease, or unspecified chronic kidney disease: Secondary | ICD-10-CM | POA: Diagnosis not present

## 2022-01-12 DIAGNOSIS — G9340 Encephalopathy, unspecified: Secondary | ICD-10-CM | POA: Diagnosis not present

## 2022-01-12 DIAGNOSIS — I2510 Atherosclerotic heart disease of native coronary artery without angina pectoris: Secondary | ICD-10-CM | POA: Diagnosis not present

## 2022-01-12 DIAGNOSIS — E1022 Type 1 diabetes mellitus with diabetic chronic kidney disease: Secondary | ICD-10-CM | POA: Diagnosis not present

## 2022-01-12 DIAGNOSIS — M6250 Muscle wasting and atrophy, not elsewhere classified, unspecified site: Secondary | ICD-10-CM | POA: Diagnosis not present

## 2022-01-12 DIAGNOSIS — G9389 Other specified disorders of brain: Secondary | ICD-10-CM | POA: Diagnosis not present

## 2022-01-12 DIAGNOSIS — Z885 Allergy status to narcotic agent status: Secondary | ICD-10-CM | POA: Diagnosis not present

## 2022-01-12 DIAGNOSIS — N1832 Chronic kidney disease, stage 3b: Secondary | ICD-10-CM | POA: Diagnosis not present

## 2022-01-12 DIAGNOSIS — I480 Paroxysmal atrial fibrillation: Secondary | ICD-10-CM | POA: Diagnosis not present

## 2022-01-12 DIAGNOSIS — N170 Acute kidney failure with tubular necrosis: Secondary | ICD-10-CM | POA: Diagnosis not present

## 2022-01-12 DIAGNOSIS — R1310 Dysphagia, unspecified: Secondary | ICD-10-CM | POA: Diagnosis not present

## 2022-01-12 DIAGNOSIS — N179 Acute kidney failure, unspecified: Secondary | ICD-10-CM | POA: Diagnosis not present

## 2022-01-12 DIAGNOSIS — Z79899 Other long term (current) drug therapy: Secondary | ICD-10-CM | POA: Diagnosis not present

## 2022-01-12 DIAGNOSIS — R109 Unspecified abdominal pain: Secondary | ICD-10-CM | POA: Diagnosis not present

## 2022-01-13 IMAGING — DX DG CHEST 1V PORT
1 series · 1 of 1 positions shown · non-contrast
Comparison: Most recent radiograph 05/25/2020.  Chest CT 07/24/2019

CLINICAL DATA: Chest pain and shortness of breath. Central pain
with left arm radiation. Cough.

EXAM:
PORTABLE CHEST 1 VIEW

[chest ap]
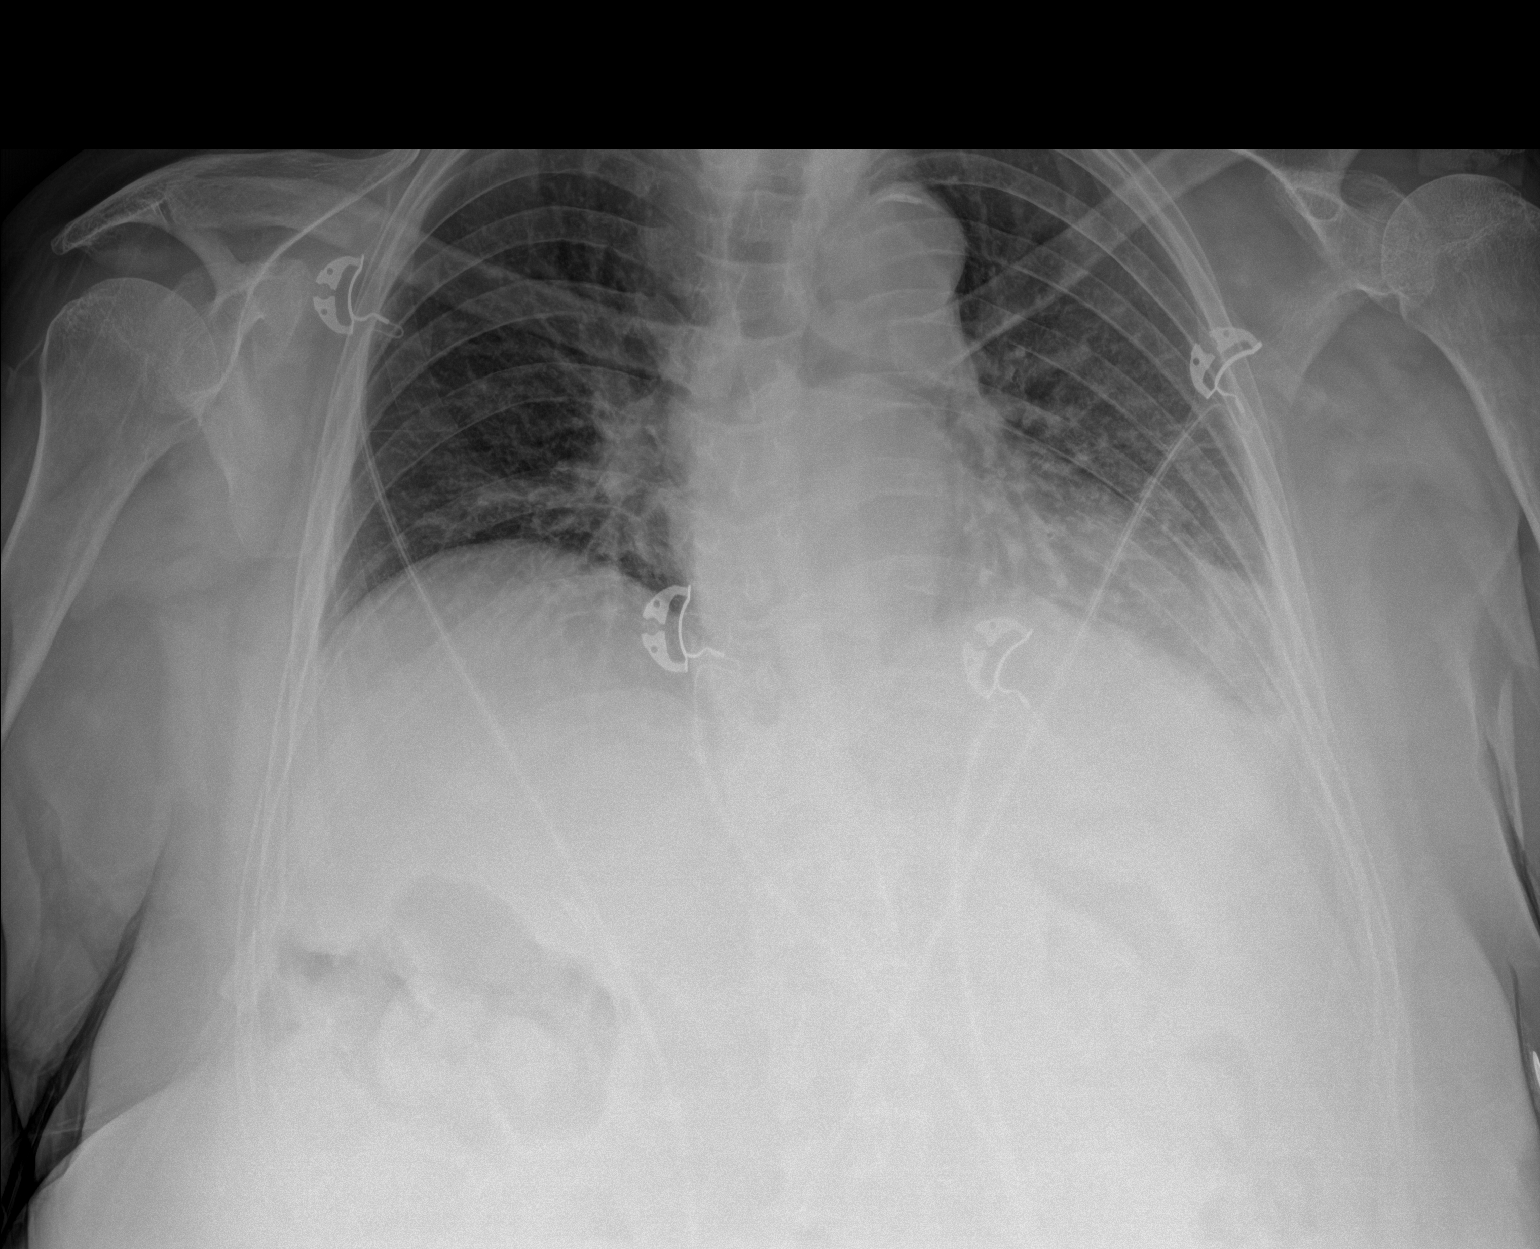

[1 of 1 positions shown; findings below may reference images not displayed]

FINDINGS: Low lung volumes limit assessment. Increasing ill-defined bibasilar
opacities. Stable heart size and mediastinal contours. No
pneumothorax or large pleural effusion. No evidence of pulmonary
edema. Stable osseous structures.
IMPRESSION: Low lung volumes with increasing ill-defined bibasilar opacities,
may be atelectasis or pneumonia in the appropriate clinical setting.

## 2022-01-14 IMAGING — CT CT ANGIO CHEST
4 of 7 series · 18 of 46 positions shown · IV contrast (omnipaque)
Comparison: None.

CLINICAL DATA: Chest and back pain.  Concern for dissection.

EXAM:
CT ANGIOGRAPHY CHEST WITH CONTRAST
TECHNIQUE: Multidetector CT imaging of the chest was performed using the
standard protocol during bolus administration of intravenous
contrast. Multiplanar CT image reconstructions and MIPs were
obtained to evaluate the vascular anatomy.
CONTRAST:  75mL OMNIPAQUE IOHEXOL 350 MG/ML SOLN

[Series 4: axial pre · axial · non-contrast · 0.74mm/px · z∈[-154,+11]mm · 4 of 57 slices shown]
[im 12/57  lung]
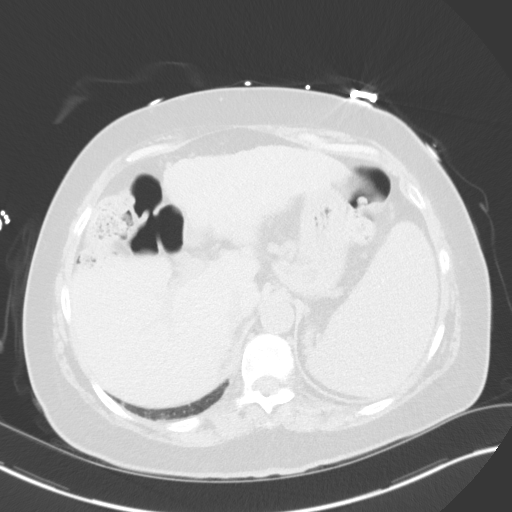
[im 23/57  lung]
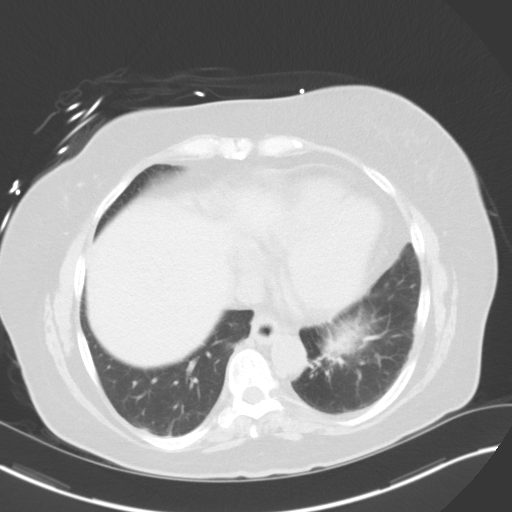
[im 34/57  lung]
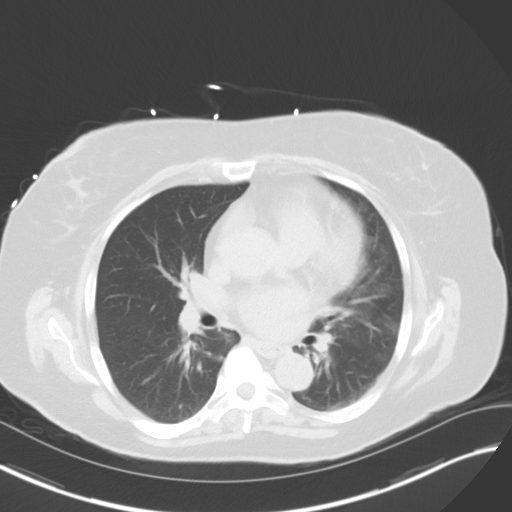
[im 45/57  lung]
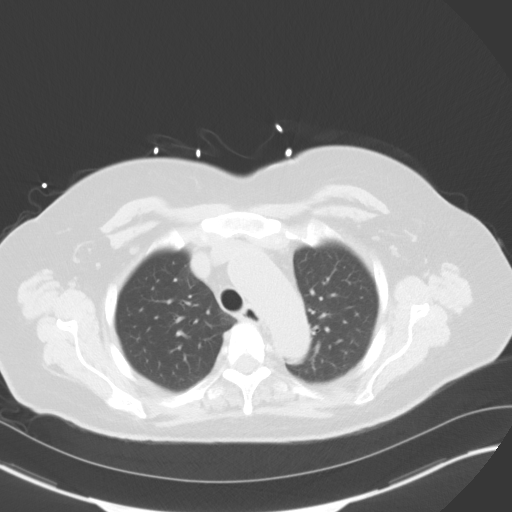

[Series 5: axial arterial · axial · arterial · 0.77mm/px · z∈[-184,+38]mm · 8 of 96 slices shown]
[im 11/96  lung]
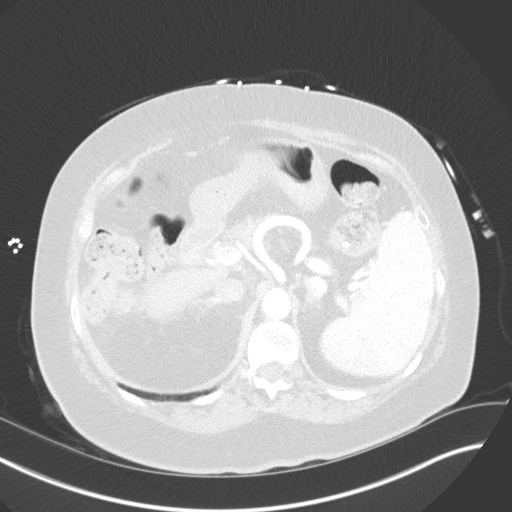
[im 22/96  soft-tissue]
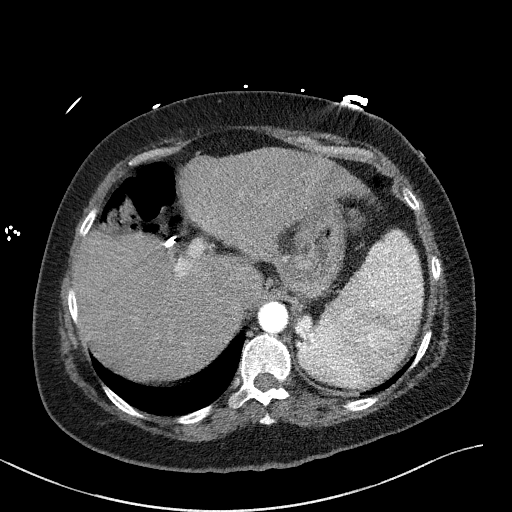
[im 32/96  lung]
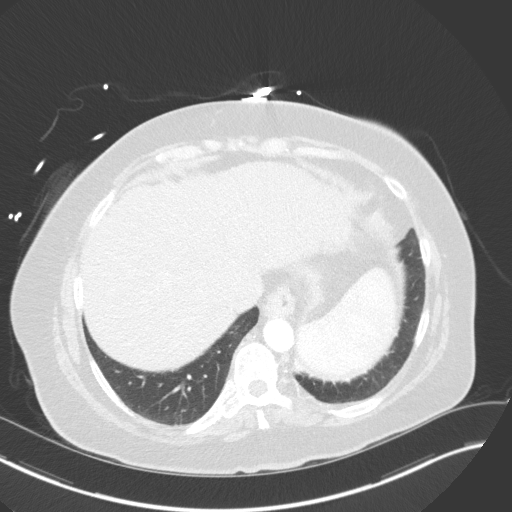
[im 43/96  soft-tissue]
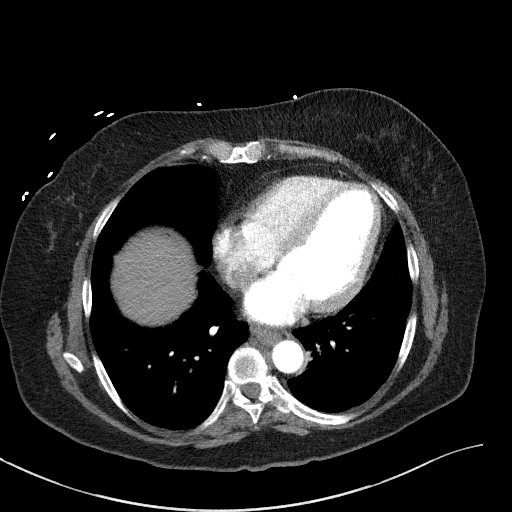
[im 53/96  lung]
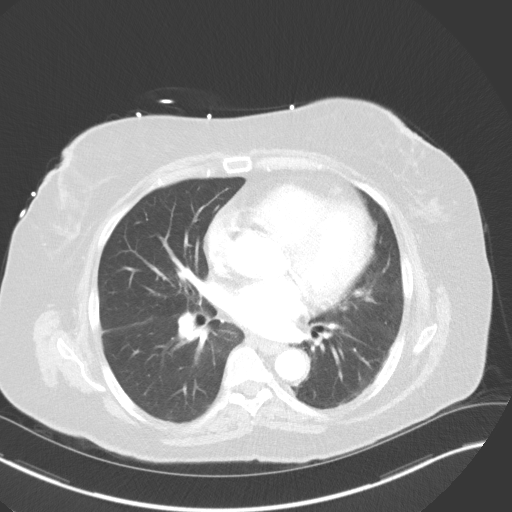
[im 64/96  soft-tissue]
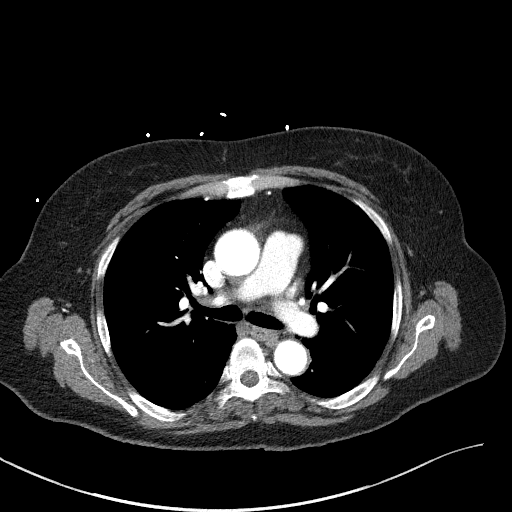
[im 74/96  lung]
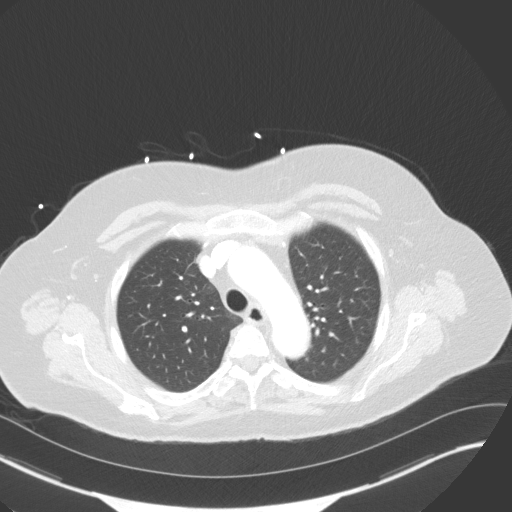
[im 85/96  soft-tissue]
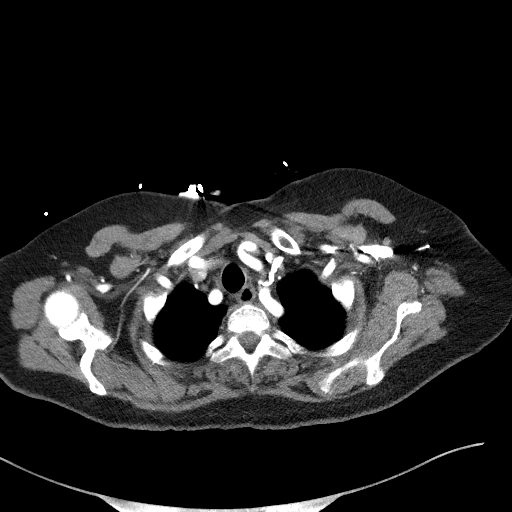

[Series 6: lung · axial · 0.77mm/px · z∈[-197,-121]mm · 3 of 144 slices shown]
[im 10/144  soft-tissue]
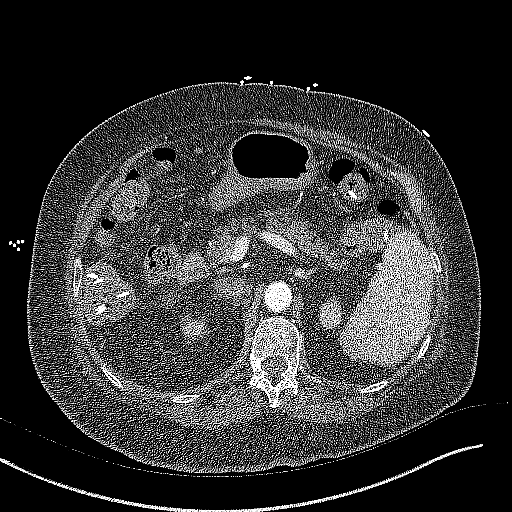
[im 29/144  soft-tissue]
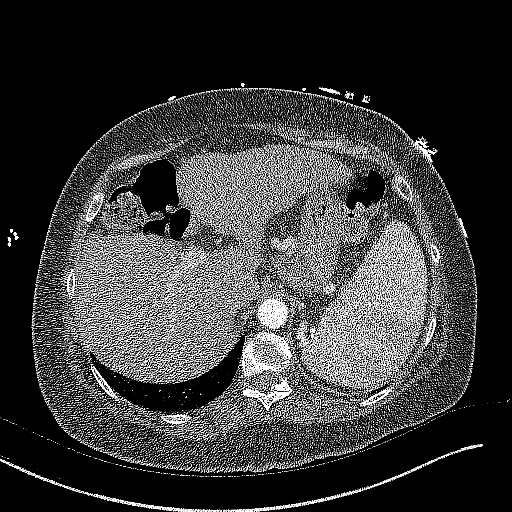
[im 48/144  soft-tissue]
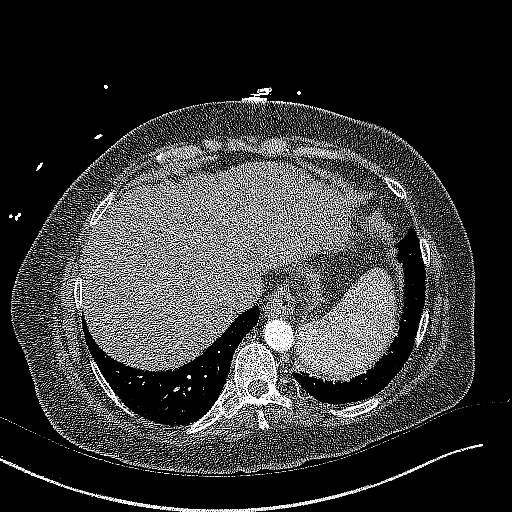

[Series 8: coronals · coronal · 0.57mm/px · 3 of 130 slices shown]
[im 33/130  soft-tissue]
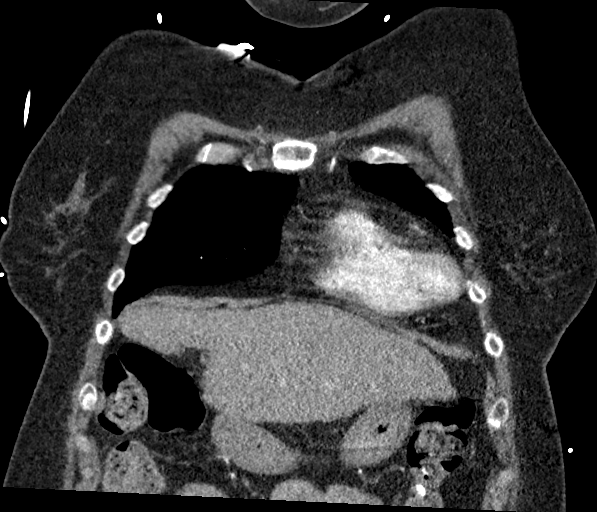
[im 65/130  soft-tissue]
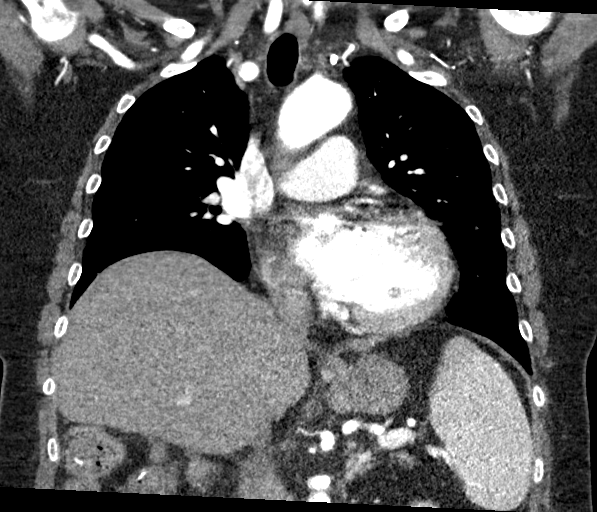
[im 97/130  soft-tissue]
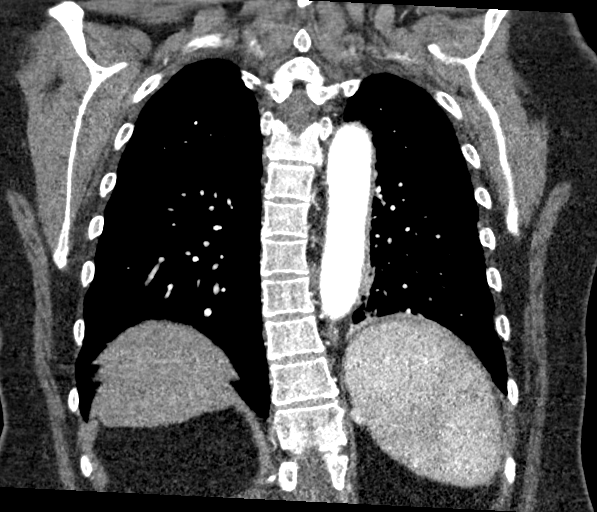

[18 of 46 positions shown; findings below may reference images not displayed]

FINDINGS: C

Cardiovascular: There is no evidence for thoracic aortic dissection.
There is no evidence for thoracic aortic aneurysm. The heart size is
normal. Atherosclerotic changes are noted of the thoracic aorta.
There is no large centrally located pulmonary embolism. Detection of
smaller pulmonary emboli is limited by contrast timing.

Mediastinum/Nodes:

-- No mediastinal lymphadenopathy.

-- No hilar lymphadenopathy.

-- No axillary lymphadenopathy.

-- No supraclavicular lymphadenopathy.

-- Normal thyroid gland where visualized.

-  Unremarkable esophagus.

Lungs/Pleura: Airways are patent. No pleural effusion, lobar
consolidation, pneumothorax or pulmonary infarction.

Upper Abdomen: There is cirrhosis with stigmata of portal
hypertension. Again noted is atrophy of the right kidney with
associated hydronephrosis.

Musculoskeletal: No chest wall abnormality. No bony spinal canal
stenosis.

Review of the MIP images confirms the above findings.
IMPRESSION: 1. No acute findings in the chest.
2. Cirrhosis with stigmata of portal hypertension.
3. Atrophic right kidney with associated hydronephrosis, better
appreciated on the patient's recent CT abdomen pelvis.

Aortic Atherosclerosis ([id number]).

## 2022-01-19 DIAGNOSIS — R6889 Other general symptoms and signs: Secondary | ICD-10-CM | POA: Diagnosis not present

## 2022-01-19 DIAGNOSIS — I10 Essential (primary) hypertension: Secondary | ICD-10-CM | POA: Diagnosis not present

## 2022-01-19 DIAGNOSIS — Z743 Need for continuous supervision: Secondary | ICD-10-CM | POA: Diagnosis not present

## 2022-01-19 DIAGNOSIS — N261 Atrophy of kidney (terminal): Secondary | ICD-10-CM | POA: Diagnosis not present

## 2022-01-19 DIAGNOSIS — I252 Old myocardial infarction: Secondary | ICD-10-CM | POA: Diagnosis not present

## 2022-01-19 DIAGNOSIS — R109 Unspecified abdominal pain: Secondary | ICD-10-CM | POA: Diagnosis not present

## 2022-01-19 DIAGNOSIS — R410 Disorientation, unspecified: Secondary | ICD-10-CM | POA: Diagnosis not present

## 2022-01-19 DIAGNOSIS — E1043 Type 1 diabetes mellitus with diabetic autonomic (poly)neuropathy: Secondary | ICD-10-CM | POA: Diagnosis not present

## 2022-01-19 DIAGNOSIS — Z794 Long term (current) use of insulin: Secondary | ICD-10-CM | POA: Diagnosis not present

## 2022-01-19 DIAGNOSIS — E11649 Type 2 diabetes mellitus with hypoglycemia without coma: Secondary | ICD-10-CM | POA: Diagnosis not present

## 2022-01-19 DIAGNOSIS — M25579 Pain in unspecified ankle and joints of unspecified foot: Secondary | ICD-10-CM | POA: Diagnosis not present

## 2022-01-19 DIAGNOSIS — E1069 Type 1 diabetes mellitus with other specified complication: Secondary | ICD-10-CM | POA: Diagnosis not present

## 2022-01-19 DIAGNOSIS — K3184 Gastroparesis: Secondary | ICD-10-CM | POA: Diagnosis not present

## 2022-01-19 DIAGNOSIS — I480 Paroxysmal atrial fibrillation: Secondary | ICD-10-CM | POA: Diagnosis not present

## 2022-01-19 DIAGNOSIS — R451 Restlessness and agitation: Secondary | ICD-10-CM | POA: Diagnosis not present

## 2022-01-19 DIAGNOSIS — R52 Pain, unspecified: Secondary | ICD-10-CM | POA: Diagnosis not present

## 2022-01-29 DIAGNOSIS — I4891 Unspecified atrial fibrillation: Secondary | ICD-10-CM | POA: Diagnosis not present

## 2022-01-29 DIAGNOSIS — E162 Hypoglycemia, unspecified: Secondary | ICD-10-CM | POA: Diagnosis not present

## 2022-02-11 DIAGNOSIS — R6889 Other general symptoms and signs: Secondary | ICD-10-CM | POA: Diagnosis not present

## 2022-02-11 DIAGNOSIS — M546 Pain in thoracic spine: Secondary | ICD-10-CM | POA: Diagnosis not present

## 2022-02-11 DIAGNOSIS — S0990XA Unspecified injury of head, initial encounter: Secondary | ICD-10-CM | POA: Diagnosis not present

## 2022-02-11 DIAGNOSIS — Z743 Need for continuous supervision: Secondary | ICD-10-CM | POA: Diagnosis not present

## 2022-02-11 DIAGNOSIS — W19XXXA Unspecified fall, initial encounter: Secondary | ICD-10-CM | POA: Diagnosis not present

## 2022-02-11 DIAGNOSIS — M549 Dorsalgia, unspecified: Secondary | ICD-10-CM | POA: Diagnosis not present

## 2022-02-11 DIAGNOSIS — Z515 Encounter for palliative care: Secondary | ICD-10-CM | POA: Diagnosis not present

## 2022-02-11 DIAGNOSIS — G8929 Other chronic pain: Secondary | ICD-10-CM | POA: Diagnosis not present

## 2022-02-12 DIAGNOSIS — R519 Headache, unspecified: Secondary | ICD-10-CM | POA: Diagnosis not present

## 2022-02-12 DIAGNOSIS — Z886 Allergy status to analgesic agent status: Secondary | ICD-10-CM | POA: Diagnosis not present

## 2022-02-12 DIAGNOSIS — Z79899 Other long term (current) drug therapy: Secondary | ICD-10-CM | POA: Diagnosis not present

## 2022-02-12 DIAGNOSIS — Z887 Allergy status to serum and vaccine status: Secondary | ICD-10-CM | POA: Diagnosis not present

## 2022-02-12 DIAGNOSIS — M546 Pain in thoracic spine: Secondary | ICD-10-CM | POA: Diagnosis not present

## 2022-02-12 DIAGNOSIS — M542 Cervicalgia: Secondary | ICD-10-CM | POA: Diagnosis not present

## 2022-02-12 DIAGNOSIS — K3184 Gastroparesis: Secondary | ICD-10-CM | POA: Diagnosis not present

## 2022-02-12 DIAGNOSIS — Z885 Allergy status to narcotic agent status: Secondary | ICD-10-CM | POA: Diagnosis not present

## 2022-02-12 DIAGNOSIS — Z881 Allergy status to other antibiotic agents status: Secondary | ICD-10-CM | POA: Diagnosis not present

## 2022-02-12 DIAGNOSIS — I480 Paroxysmal atrial fibrillation: Secondary | ICD-10-CM | POA: Diagnosis not present

## 2022-02-12 DIAGNOSIS — Z743 Need for continuous supervision: Secondary | ICD-10-CM | POA: Diagnosis not present

## 2022-02-12 DIAGNOSIS — S199XXA Unspecified injury of neck, initial encounter: Secondary | ICD-10-CM | POA: Diagnosis not present

## 2022-02-12 DIAGNOSIS — R4020 Unspecified coma: Secondary | ICD-10-CM | POA: Diagnosis not present

## 2022-02-12 DIAGNOSIS — Z8673 Personal history of transient ischemic attack (TIA), and cerebral infarction without residual deficits: Secondary | ICD-10-CM | POA: Diagnosis not present

## 2022-02-12 DIAGNOSIS — M549 Dorsalgia, unspecified: Secondary | ICD-10-CM | POA: Diagnosis not present

## 2022-02-12 DIAGNOSIS — E1043 Type 1 diabetes mellitus with diabetic autonomic (poly)neuropathy: Secondary | ICD-10-CM | POA: Diagnosis not present

## 2022-02-12 DIAGNOSIS — S80919A Unspecified superficial injury of unspecified knee, initial encounter: Secondary | ICD-10-CM | POA: Diagnosis not present

## 2022-02-12 DIAGNOSIS — I252 Old myocardial infarction: Secondary | ICD-10-CM | POA: Diagnosis not present

## 2022-02-12 DIAGNOSIS — Z91040 Latex allergy status: Secondary | ICD-10-CM | POA: Diagnosis not present

## 2022-02-12 DIAGNOSIS — Z888 Allergy status to other drugs, medicaments and biological substances status: Secondary | ICD-10-CM | POA: Diagnosis not present

## 2022-03-01 DIAGNOSIS — R9389 Abnormal findings on diagnostic imaging of other specified body structures: Secondary | ICD-10-CM | POA: Diagnosis not present

## 2022-03-01 DIAGNOSIS — R9089 Other abnormal findings on diagnostic imaging of central nervous system: Secondary | ICD-10-CM | POA: Diagnosis not present

## 2022-03-01 DIAGNOSIS — K7689 Other specified diseases of liver: Secondary | ICD-10-CM | POA: Diagnosis not present

## 2022-03-01 DIAGNOSIS — I6781 Acute cerebrovascular insufficiency: Secondary | ICD-10-CM | POA: Diagnosis not present

## 2022-03-01 DIAGNOSIS — I6529 Occlusion and stenosis of unspecified carotid artery: Secondary | ICD-10-CM | POA: Diagnosis not present

## 2022-03-01 DIAGNOSIS — R9341 Abnormal radiologic findings on diagnostic imaging of renal pelvis, ureter, or bladder: Secondary | ICD-10-CM | POA: Diagnosis not present

## 2022-03-01 DIAGNOSIS — K5730 Diverticulosis of large intestine without perforation or abscess without bleeding: Secondary | ICD-10-CM | POA: Diagnosis not present

## 2022-03-10 DIAGNOSIS — I4891 Unspecified atrial fibrillation: Secondary | ICD-10-CM | POA: Diagnosis not present

## 2022-03-10 DIAGNOSIS — E162 Hypoglycemia, unspecified: Secondary | ICD-10-CM | POA: Diagnosis not present

## 2022-03-19 DIAGNOSIS — Z886 Allergy status to analgesic agent status: Secondary | ICD-10-CM | POA: Diagnosis not present

## 2022-03-19 DIAGNOSIS — R6889 Other general symptoms and signs: Secondary | ICD-10-CM | POA: Diagnosis not present

## 2022-03-19 DIAGNOSIS — I129 Hypertensive chronic kidney disease with stage 1 through stage 4 chronic kidney disease, or unspecified chronic kidney disease: Secondary | ICD-10-CM | POA: Diagnosis not present

## 2022-03-19 DIAGNOSIS — Z8673 Personal history of transient ischemic attack (TIA), and cerebral infarction without residual deficits: Secondary | ICD-10-CM | POA: Diagnosis not present

## 2022-03-19 DIAGNOSIS — G8929 Other chronic pain: Secondary | ICD-10-CM | POA: Diagnosis not present

## 2022-03-19 DIAGNOSIS — E1022 Type 1 diabetes mellitus with diabetic chronic kidney disease: Secondary | ICD-10-CM | POA: Diagnosis not present

## 2022-03-19 DIAGNOSIS — Z881 Allergy status to other antibiotic agents status: Secondary | ICD-10-CM | POA: Diagnosis not present

## 2022-03-19 DIAGNOSIS — I252 Old myocardial infarction: Secondary | ICD-10-CM | POA: Diagnosis not present

## 2022-03-19 DIAGNOSIS — Z20822 Contact with and (suspected) exposure to covid-19: Secondary | ICD-10-CM | POA: Diagnosis not present

## 2022-03-19 DIAGNOSIS — K3184 Gastroparesis: Secondary | ICD-10-CM | POA: Diagnosis not present

## 2022-03-19 DIAGNOSIS — R531 Weakness: Secondary | ICD-10-CM | POA: Diagnosis not present

## 2022-03-19 DIAGNOSIS — Z885 Allergy status to narcotic agent status: Secondary | ICD-10-CM | POA: Diagnosis not present

## 2022-03-19 DIAGNOSIS — Z743 Need for continuous supervision: Secondary | ICD-10-CM | POA: Diagnosis not present

## 2022-03-19 DIAGNOSIS — E860 Dehydration: Secondary | ICD-10-CM | POA: Diagnosis not present

## 2022-03-19 DIAGNOSIS — K7469 Other cirrhosis of liver: Secondary | ICD-10-CM | POA: Diagnosis not present

## 2022-03-19 DIAGNOSIS — E1043 Type 1 diabetes mellitus with diabetic autonomic (poly)neuropathy: Secondary | ICD-10-CM | POA: Diagnosis not present

## 2022-03-19 DIAGNOSIS — Z66 Do not resuscitate: Secondary | ICD-10-CM | POA: Diagnosis not present

## 2022-03-19 DIAGNOSIS — Z887 Allergy status to serum and vaccine status: Secondary | ICD-10-CM | POA: Diagnosis not present

## 2022-03-19 DIAGNOSIS — I480 Paroxysmal atrial fibrillation: Secondary | ICD-10-CM | POA: Diagnosis not present

## 2022-03-19 DIAGNOSIS — K7581 Nonalcoholic steatohepatitis (NASH): Secondary | ICD-10-CM | POA: Diagnosis not present

## 2022-03-19 DIAGNOSIS — D500 Iron deficiency anemia secondary to blood loss (chronic): Secondary | ICD-10-CM | POA: Diagnosis not present

## 2022-03-19 DIAGNOSIS — R112 Nausea with vomiting, unspecified: Secondary | ICD-10-CM | POA: Diagnosis not present

## 2022-03-19 DIAGNOSIS — R079 Chest pain, unspecified: Secondary | ICD-10-CM | POA: Diagnosis not present

## 2022-03-19 DIAGNOSIS — Z794 Long term (current) use of insulin: Secondary | ICD-10-CM | POA: Diagnosis not present

## 2022-03-19 DIAGNOSIS — N1832 Chronic kidney disease, stage 3b: Secondary | ICD-10-CM | POA: Diagnosis not present

## 2022-03-19 DIAGNOSIS — Z79899 Other long term (current) drug therapy: Secondary | ICD-10-CM | POA: Diagnosis not present

## 2022-03-19 DIAGNOSIS — N179 Acute kidney failure, unspecified: Secondary | ICD-10-CM | POA: Diagnosis not present

## 2022-03-20 DIAGNOSIS — M6250 Muscle wasting and atrophy, not elsewhere classified, unspecified site: Secondary | ICD-10-CM | POA: Diagnosis not present

## 2022-03-20 DIAGNOSIS — R531 Weakness: Secondary | ICD-10-CM | POA: Diagnosis not present

## 2022-03-31 IMAGING — CR DG FOOT COMPLETE 3+V*L*
3 series · 3 of 3 positions shown · non-contrast
Comparison: None.

CLINICAL DATA: 64-year-old female with left lower extremity pain
and swelling.

EXAM:
LEFT FOOT - COMPLETE 3+ VIEW; LEFT ANKLE COMPLETE - 3+ VIEW

[foot ap]
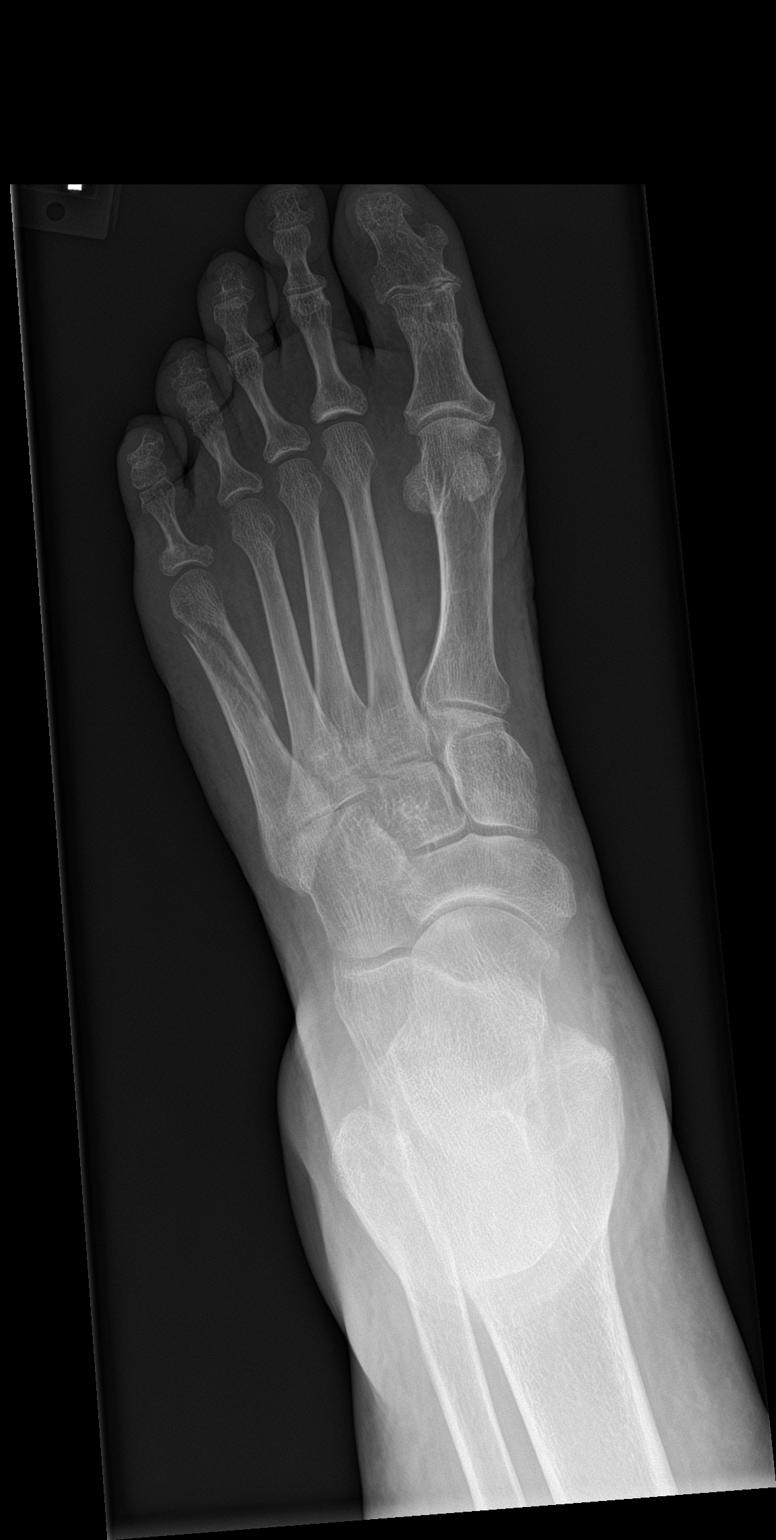

[foot obl]
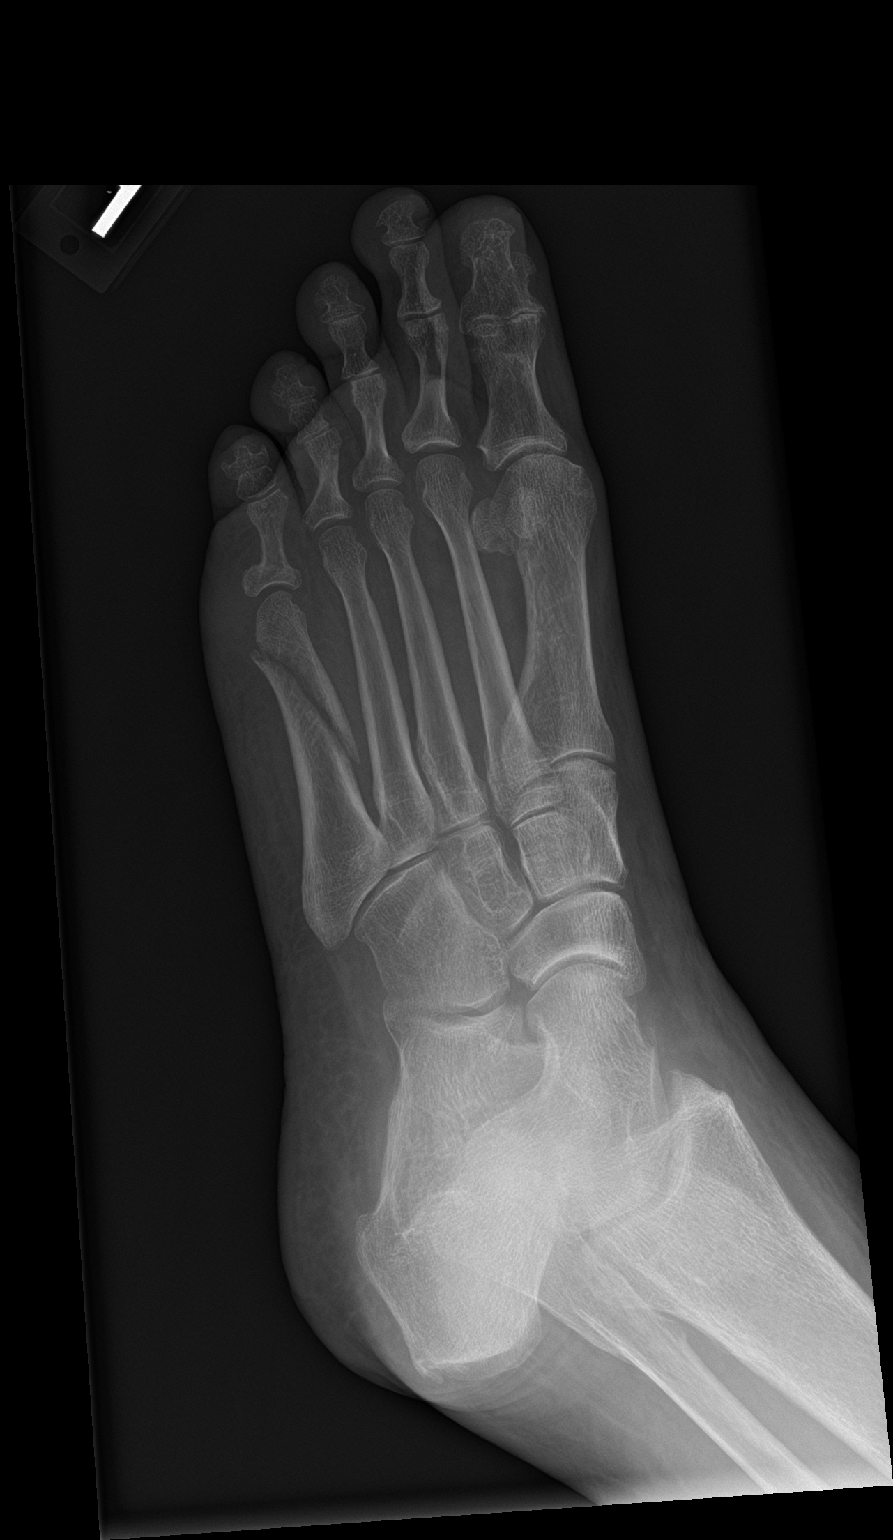

[foot lat]
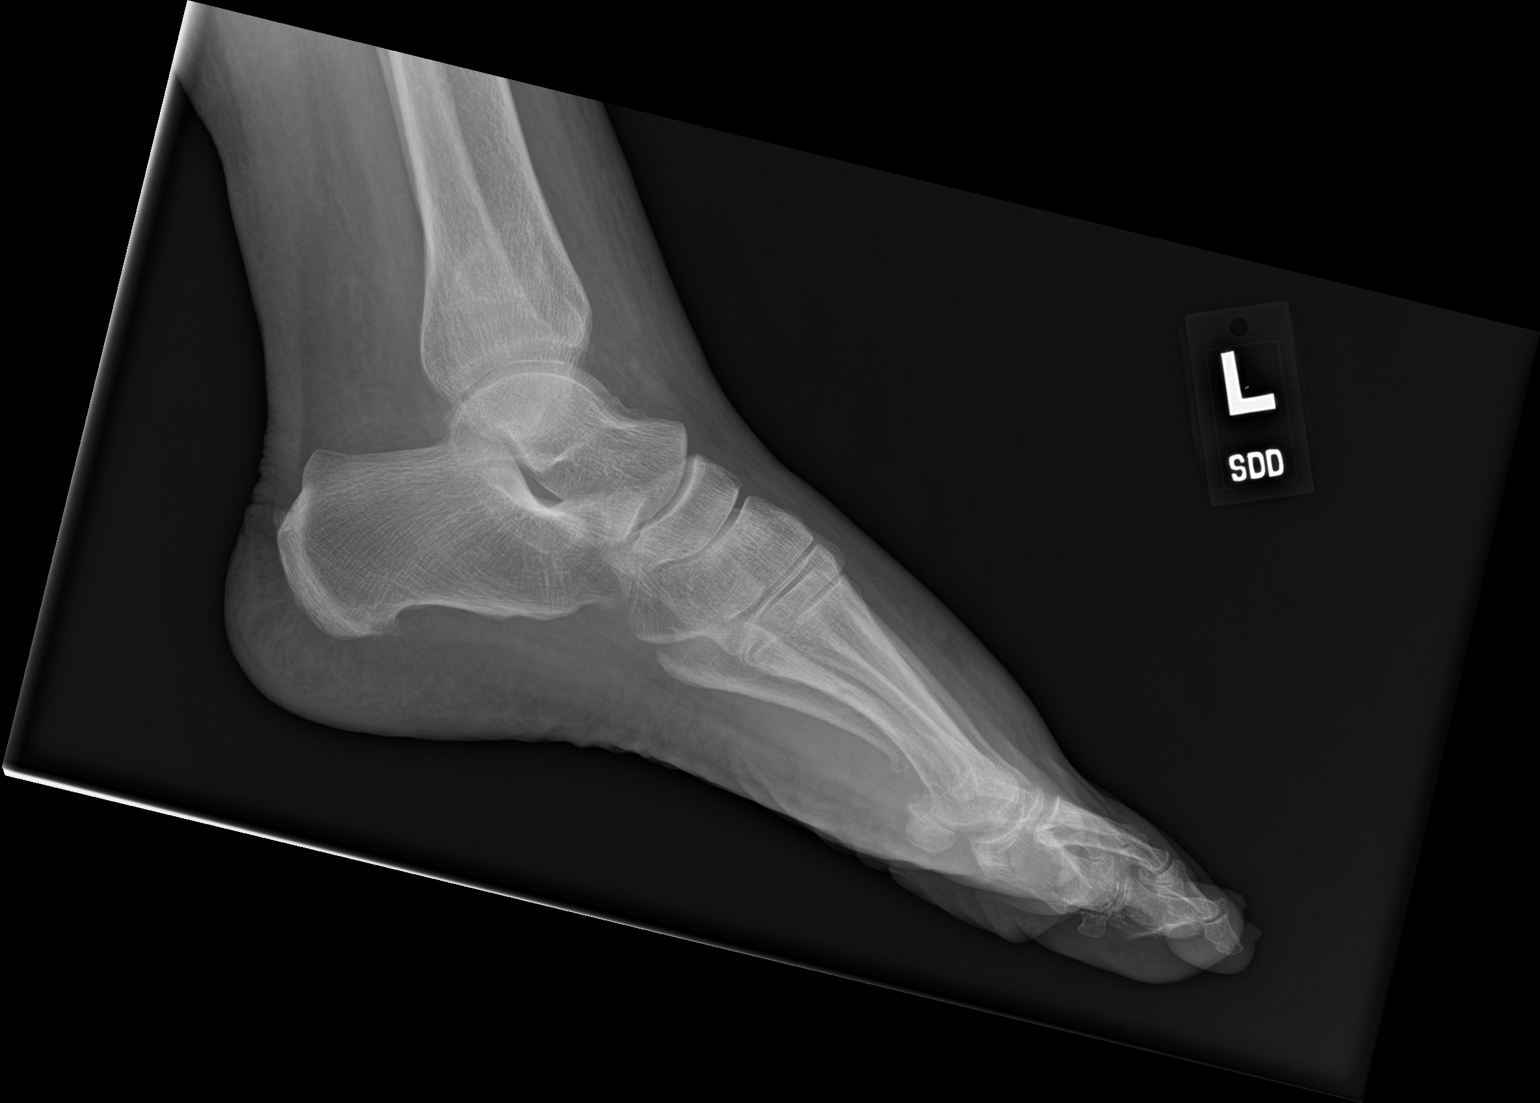

[3 of 3 positions shown; findings below may reference images not displayed]

FINDINGS: There is a mildly displaced oblique fracture of the fifth
metatarsal. No other acute fracture. The bones are osteopenic. There
is no dislocation. The ankle mortise is intact. There is diffuse
subcutaneous edema.
IMPRESSION: Mildly displaced oblique fracture of the fifth metatarsal.

## 2022-03-31 IMAGING — CR DG ANKLE COMPLETE 3+V*L*
3 series · 3 of 3 positions shown · non-contrast
Comparison: None.

CLINICAL DATA: 64-year-old female with left lower extremity pain
and swelling.

EXAM:
LEFT FOOT - COMPLETE 3+ VIEW; LEFT ANKLE COMPLETE - 3+ VIEW

[ankle ap]
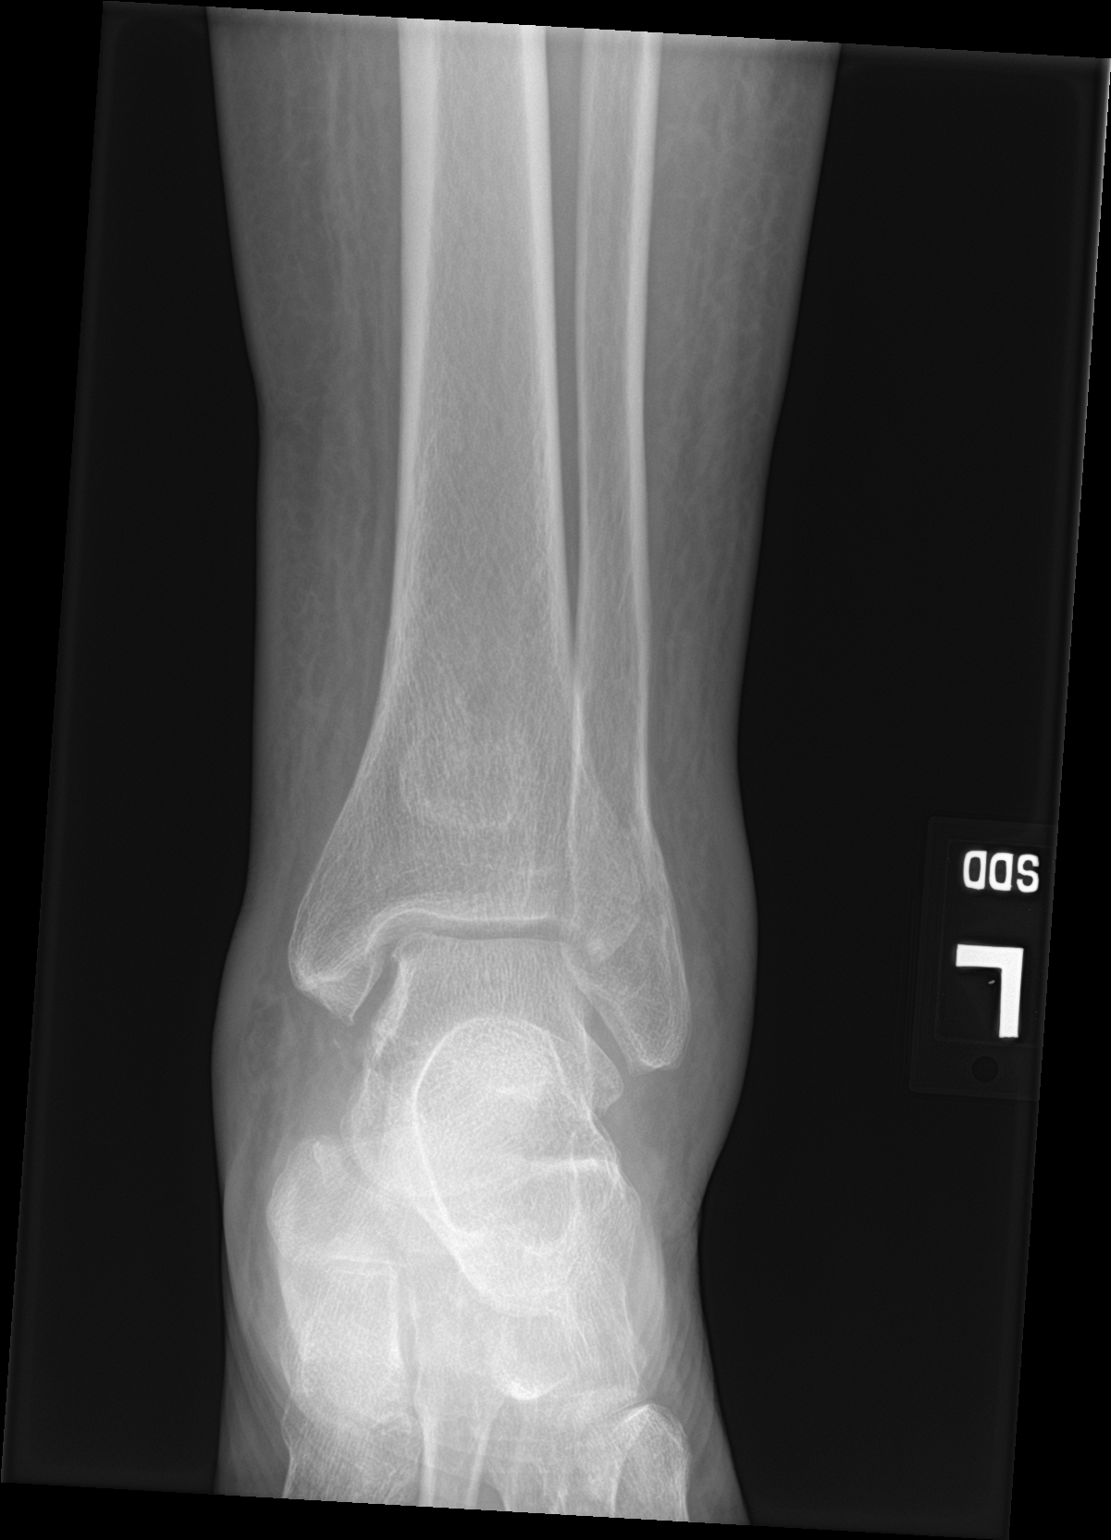

[ankle obl]
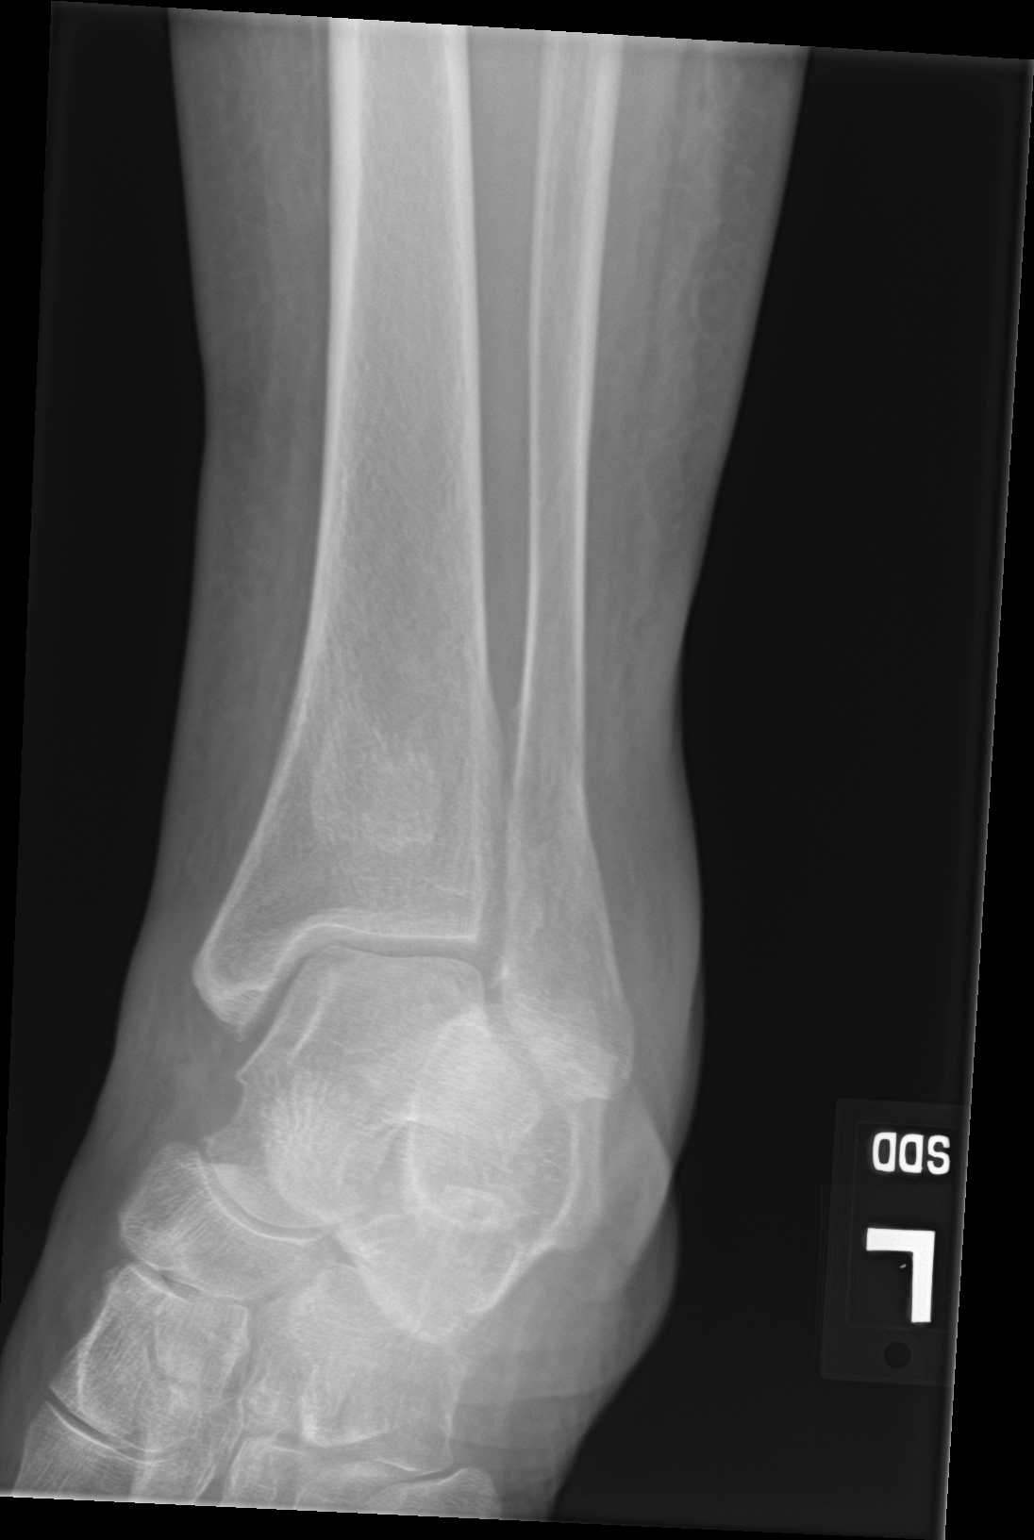

[ankle lat]
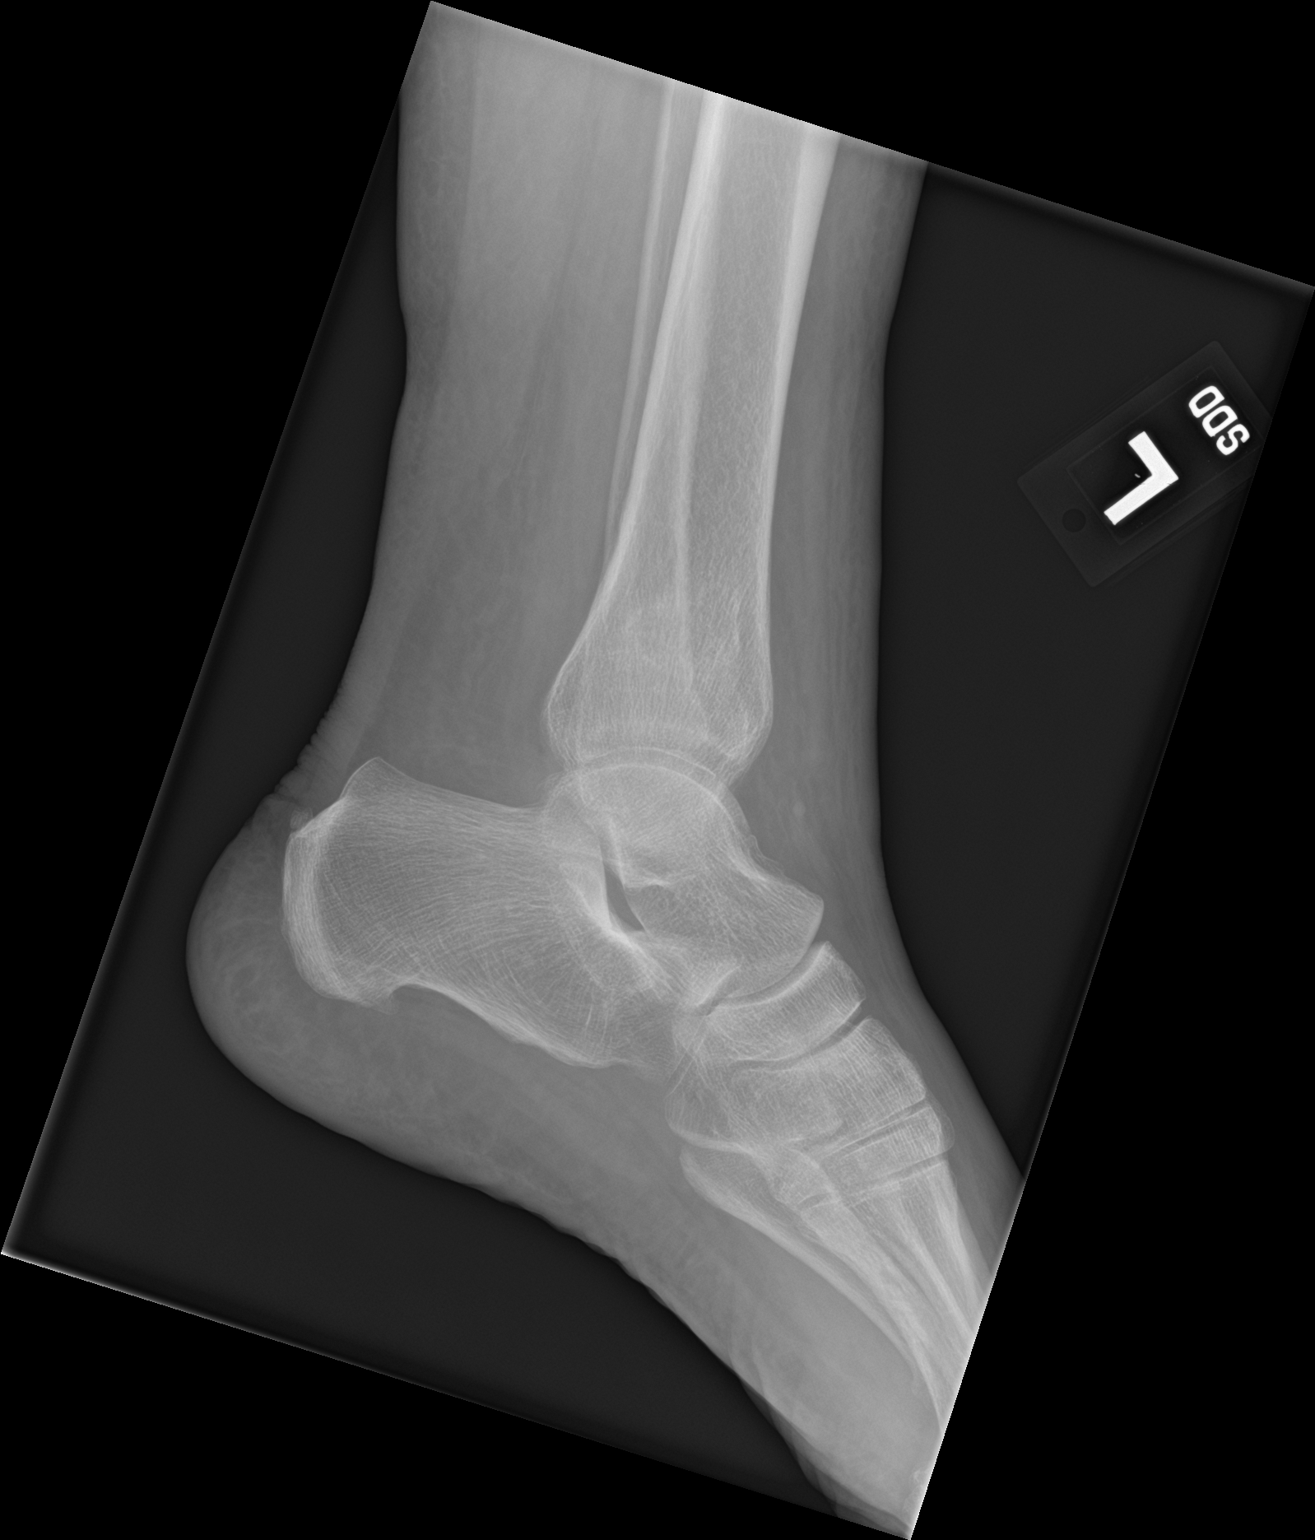

[3 of 3 positions shown; findings below may reference images not displayed]

FINDINGS: There is a mildly displaced oblique fracture of the fifth
metatarsal. No other acute fracture. The bones are osteopenic. There
is no dislocation. The ankle mortise is intact. There is diffuse
subcutaneous edema.
IMPRESSION: Mildly displaced oblique fracture of the fifth metatarsal.

## 2022-04-30 DIAGNOSIS — R0789 Other chest pain: Secondary | ICD-10-CM | POA: Diagnosis not present

## 2022-04-30 DIAGNOSIS — R079 Chest pain, unspecified: Secondary | ICD-10-CM | POA: Diagnosis not present

## 2022-04-30 DIAGNOSIS — R109 Unspecified abdominal pain: Secondary | ICD-10-CM | POA: Diagnosis not present

## 2022-04-30 DIAGNOSIS — E119 Type 2 diabetes mellitus without complications: Secondary | ICD-10-CM | POA: Diagnosis not present

## 2022-04-30 DIAGNOSIS — M549 Dorsalgia, unspecified: Secondary | ICD-10-CM | POA: Diagnosis not present

## 2022-04-30 DIAGNOSIS — R112 Nausea with vomiting, unspecified: Secondary | ICD-10-CM | POA: Diagnosis not present

## 2022-04-30 DIAGNOSIS — K7581 Nonalcoholic steatohepatitis (NASH): Secondary | ICD-10-CM | POA: Diagnosis not present

## 2022-05-01 DIAGNOSIS — I4891 Unspecified atrial fibrillation: Secondary | ICD-10-CM | POA: Diagnosis not present

## 2022-05-01 DIAGNOSIS — E162 Hypoglycemia, unspecified: Secondary | ICD-10-CM | POA: Diagnosis not present

## 2022-05-02 DIAGNOSIS — T402X5A Adverse effect of other opioids, initial encounter: Secondary | ICD-10-CM | POA: Diagnosis not present

## 2022-05-02 DIAGNOSIS — T40605A Adverse effect of unspecified narcotics, initial encounter: Secondary | ICD-10-CM | POA: Diagnosis not present

## 2022-05-02 DIAGNOSIS — Z79899 Other long term (current) drug therapy: Secondary | ICD-10-CM | POA: Diagnosis not present

## 2022-05-02 DIAGNOSIS — R400 Somnolence: Secondary | ICD-10-CM | POA: Diagnosis not present

## 2022-06-01 DIAGNOSIS — I4891 Unspecified atrial fibrillation: Secondary | ICD-10-CM | POA: Diagnosis not present

## 2022-06-01 DIAGNOSIS — E162 Hypoglycemia, unspecified: Secondary | ICD-10-CM | POA: Diagnosis not present

## 2022-06-06 DIAGNOSIS — R5381 Other malaise: Secondary | ICD-10-CM | POA: Diagnosis not present

## 2022-06-06 DIAGNOSIS — R531 Weakness: Secondary | ICD-10-CM | POA: Diagnosis not present

## 2022-06-06 DIAGNOSIS — R109 Unspecified abdominal pain: Secondary | ICD-10-CM | POA: Diagnosis not present

## 2022-06-06 DIAGNOSIS — K257 Chronic gastric ulcer without hemorrhage or perforation: Secondary | ICD-10-CM | POA: Diagnosis not present

## 2022-06-06 DIAGNOSIS — K920 Hematemesis: Secondary | ICD-10-CM | POA: Diagnosis not present

## 2022-06-06 DIAGNOSIS — Z66 Do not resuscitate: Secondary | ICD-10-CM | POA: Diagnosis not present

## 2022-06-06 DIAGNOSIS — D696 Thrombocytopenia, unspecified: Secondary | ICD-10-CM | POA: Diagnosis not present

## 2022-06-06 DIAGNOSIS — R8569 Abnormal cytological findings in specimens from other digestive organs and abdominal cavity: Secondary | ICD-10-CM | POA: Diagnosis not present

## 2022-06-06 DIAGNOSIS — K7469 Other cirrhosis of liver: Secondary | ICD-10-CM | POA: Diagnosis not present

## 2022-06-06 DIAGNOSIS — D6959 Other secondary thrombocytopenia: Secondary | ICD-10-CM | POA: Diagnosis not present

## 2022-06-06 DIAGNOSIS — G9341 Metabolic encephalopathy: Secondary | ICD-10-CM | POA: Diagnosis not present

## 2022-06-06 DIAGNOSIS — S4991XA Unspecified injury of right shoulder and upper arm, initial encounter: Secondary | ICD-10-CM | POA: Diagnosis not present

## 2022-06-06 DIAGNOSIS — Z599 Problem related to housing and economic circumstances, unspecified: Secondary | ICD-10-CM | POA: Diagnosis not present

## 2022-06-06 DIAGNOSIS — Z5948 Other specified lack of adequate food: Secondary | ICD-10-CM | POA: Diagnosis not present

## 2022-06-06 DIAGNOSIS — Z79899 Other long term (current) drug therapy: Secondary | ICD-10-CM | POA: Diagnosis not present

## 2022-06-06 DIAGNOSIS — Z5901 Sheltered homelessness: Secondary | ICD-10-CM | POA: Diagnosis not present

## 2022-06-06 DIAGNOSIS — Z59819 Housing instability, housed unspecified: Secondary | ICD-10-CM | POA: Diagnosis not present

## 2022-06-06 DIAGNOSIS — R2681 Unsteadiness on feet: Secondary | ICD-10-CM | POA: Diagnosis not present

## 2022-06-06 DIAGNOSIS — K7581 Nonalcoholic steatohepatitis (NASH): Secondary | ICD-10-CM | POA: Diagnosis not present

## 2022-06-06 DIAGNOSIS — E869 Volume depletion, unspecified: Secondary | ICD-10-CM | POA: Diagnosis not present

## 2022-06-06 DIAGNOSIS — M545 Low back pain, unspecified: Secondary | ICD-10-CM | POA: Diagnosis not present

## 2022-06-06 DIAGNOSIS — Z515 Encounter for palliative care: Secondary | ICD-10-CM | POA: Diagnosis not present

## 2022-06-06 DIAGNOSIS — I8500 Esophageal varices without bleeding: Secondary | ICD-10-CM | POA: Diagnosis not present

## 2022-06-06 DIAGNOSIS — D61818 Other pancytopenia: Secondary | ICD-10-CM | POA: Diagnosis not present

## 2022-06-06 DIAGNOSIS — N179 Acute kidney failure, unspecified: Secondary | ICD-10-CM | POA: Diagnosis not present

## 2022-06-06 DIAGNOSIS — K921 Melena: Secondary | ICD-10-CM | POA: Diagnosis not present

## 2022-06-06 DIAGNOSIS — Z5941 Food insecurity: Secondary | ICD-10-CM | POA: Diagnosis not present

## 2022-06-06 DIAGNOSIS — R262 Difficulty in walking, not elsewhere classified: Secondary | ICD-10-CM | POA: Diagnosis not present

## 2022-06-06 DIAGNOSIS — M6281 Muscle weakness (generalized): Secondary | ICD-10-CM | POA: Diagnosis not present

## 2022-06-06 DIAGNOSIS — G8929 Other chronic pain: Secondary | ICD-10-CM | POA: Diagnosis not present

## 2022-06-06 DIAGNOSIS — N3001 Acute cystitis with hematuria: Secondary | ICD-10-CM | POA: Diagnosis not present

## 2022-06-06 DIAGNOSIS — I8510 Secondary esophageal varices without bleeding: Secondary | ICD-10-CM | POA: Diagnosis not present

## 2022-06-06 DIAGNOSIS — I868 Varicose veins of other specified sites: Secondary | ICD-10-CM | POA: Diagnosis not present

## 2022-06-06 DIAGNOSIS — R0789 Other chest pain: Secondary | ICD-10-CM | POA: Diagnosis not present

## 2022-06-06 DIAGNOSIS — I10 Essential (primary) hypertension: Secondary | ICD-10-CM | POA: Diagnosis not present

## 2022-06-06 DIAGNOSIS — K254 Chronic or unspecified gastric ulcer with hemorrhage: Secondary | ICD-10-CM | POA: Diagnosis not present

## 2022-06-06 DIAGNOSIS — K259 Gastric ulcer, unspecified as acute or chronic, without hemorrhage or perforation: Secondary | ICD-10-CM | POA: Diagnosis not present

## 2022-06-06 DIAGNOSIS — R188 Other ascites: Secondary | ICD-10-CM | POA: Diagnosis not present

## 2022-06-06 DIAGNOSIS — K7460 Unspecified cirrhosis of liver: Secondary | ICD-10-CM | POA: Diagnosis not present

## 2022-06-06 DIAGNOSIS — E7220 Disorder of urea cycle metabolism, unspecified: Secondary | ICD-10-CM | POA: Diagnosis not present

## 2022-06-06 DIAGNOSIS — K3189 Other diseases of stomach and duodenum: Secondary | ICD-10-CM | POA: Diagnosis not present

## 2022-06-06 DIAGNOSIS — K766 Portal hypertension: Secondary | ICD-10-CM | POA: Diagnosis not present

## 2022-06-06 DIAGNOSIS — D62 Acute posthemorrhagic anemia: Secondary | ICD-10-CM | POA: Diagnosis not present

## 2022-06-06 DIAGNOSIS — S59911A Unspecified injury of right forearm, initial encounter: Secondary | ICD-10-CM | POA: Diagnosis not present

## 2022-06-06 DIAGNOSIS — S0990XA Unspecified injury of head, initial encounter: Secondary | ICD-10-CM | POA: Diagnosis not present

## 2022-06-06 DIAGNOSIS — S40021A Contusion of right upper arm, initial encounter: Secondary | ICD-10-CM | POA: Diagnosis not present

## 2022-06-07 DIAGNOSIS — Z5948 Other specified lack of adequate food: Secondary | ICD-10-CM | POA: Diagnosis not present

## 2022-06-07 DIAGNOSIS — K920 Hematemesis: Secondary | ICD-10-CM | POA: Diagnosis not present

## 2022-06-07 DIAGNOSIS — Z59819 Housing instability, housed unspecified: Secondary | ICD-10-CM | POA: Diagnosis not present

## 2022-06-08 DIAGNOSIS — G9341 Metabolic encephalopathy: Secondary | ICD-10-CM | POA: Diagnosis not present

## 2022-06-08 DIAGNOSIS — N179 Acute kidney failure, unspecified: Secondary | ICD-10-CM | POA: Diagnosis not present

## 2022-06-08 DIAGNOSIS — K920 Hematemesis: Secondary | ICD-10-CM | POA: Diagnosis not present

## 2022-06-08 DIAGNOSIS — Z59819 Housing instability, housed unspecified: Secondary | ICD-10-CM | POA: Diagnosis not present

## 2022-06-08 DIAGNOSIS — K7460 Unspecified cirrhosis of liver: Secondary | ICD-10-CM | POA: Diagnosis not present

## 2022-06-08 DIAGNOSIS — Z5948 Other specified lack of adequate food: Secondary | ICD-10-CM | POA: Diagnosis not present

## 2022-06-08 DIAGNOSIS — K7581 Nonalcoholic steatohepatitis (NASH): Secondary | ICD-10-CM | POA: Diagnosis not present

## 2022-06-08 DIAGNOSIS — D696 Thrombocytopenia, unspecified: Secondary | ICD-10-CM | POA: Diagnosis not present

## 2022-06-09 DIAGNOSIS — K766 Portal hypertension: Secondary | ICD-10-CM | POA: Diagnosis not present

## 2022-06-09 DIAGNOSIS — K920 Hematemesis: Secondary | ICD-10-CM | POA: Diagnosis not present

## 2022-06-09 DIAGNOSIS — Z59819 Housing instability, housed unspecified: Secondary | ICD-10-CM | POA: Diagnosis not present

## 2022-06-09 DIAGNOSIS — K3189 Other diseases of stomach and duodenum: Secondary | ICD-10-CM | POA: Diagnosis not present

## 2022-06-09 DIAGNOSIS — R5381 Other malaise: Secondary | ICD-10-CM | POA: Diagnosis not present

## 2022-06-09 DIAGNOSIS — Z5948 Other specified lack of adequate food: Secondary | ICD-10-CM | POA: Diagnosis not present

## 2022-06-09 DIAGNOSIS — K257 Chronic gastric ulcer without hemorrhage or perforation: Secondary | ICD-10-CM | POA: Diagnosis not present

## 2022-06-09 IMAGING — CR DG CHEST 2V
2 series · 2 of 2 positions shown · non-contrast
Comparison: 07/01/2020

CLINICAL DATA: Chest pain, shortness of breath for 5 days,
congestion, fever, neighbor recently tested positive for [id number]

EXAM:
CHEST - 2 VIEW

[chest pa]
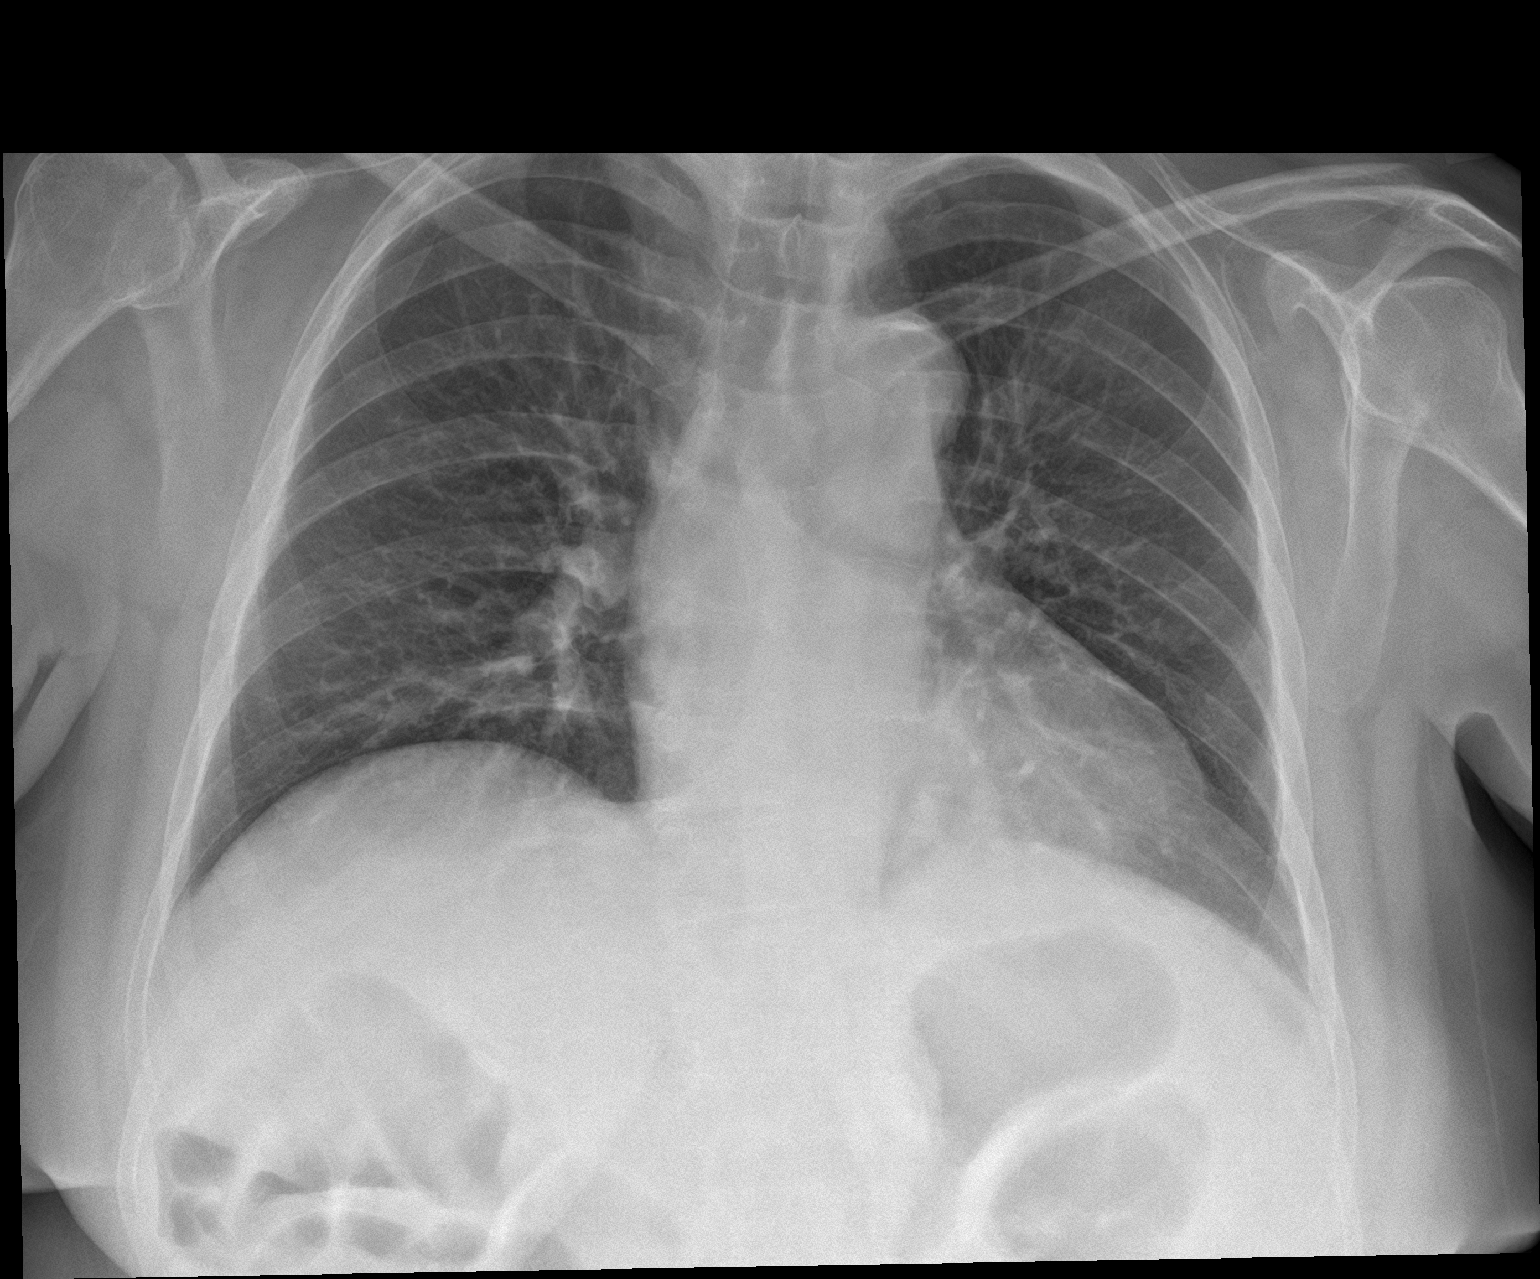

[chest lat]
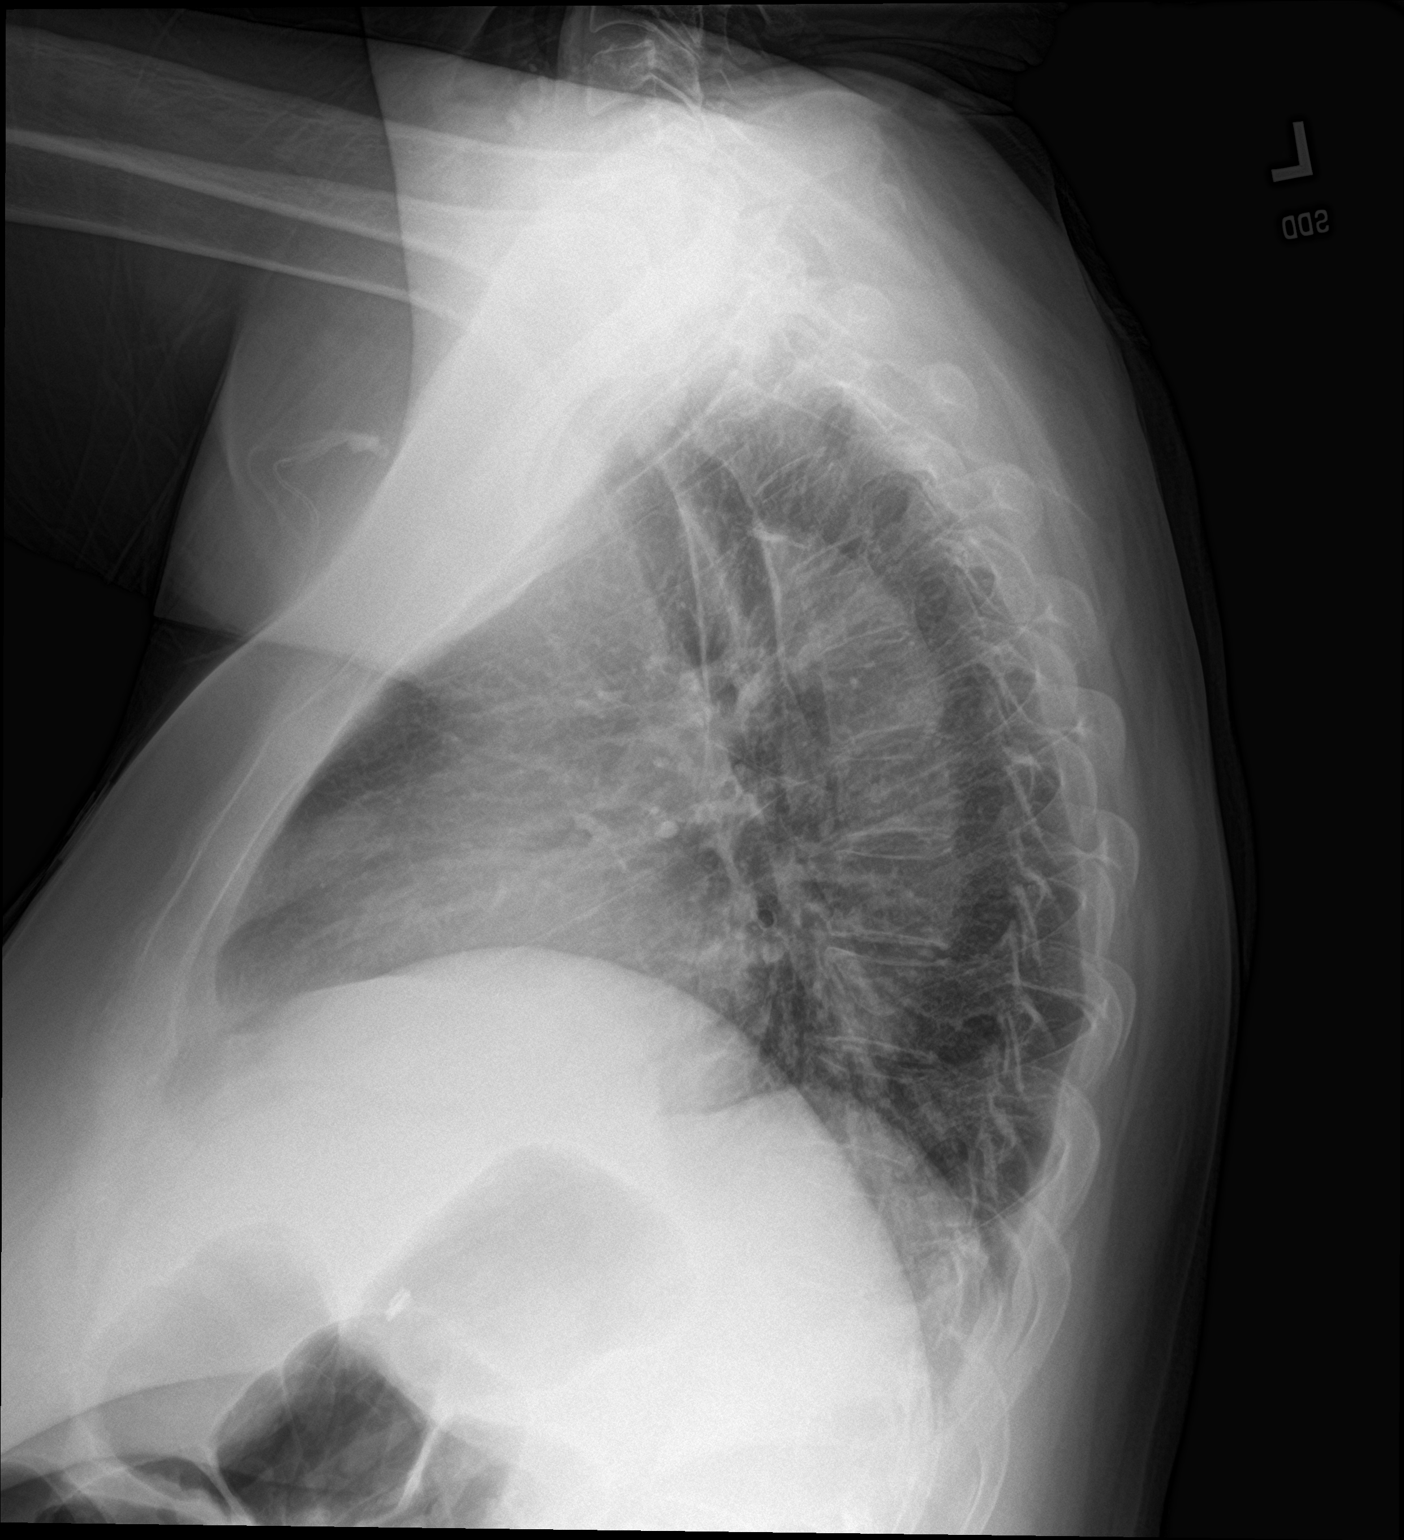

[2 of 2 positions shown; findings below may reference images not displayed]

FINDINGS: Normal heart size, mediastinal contours, and pulmonary vascularity.

Atherosclerotic calcification aorta.

Minimal atelectasis at RIGHT base.

Remaining lungs clear.

No acute infiltrate, pleural effusion, or pneumothorax.

Osseous structures unremarkable.
IMPRESSION: Minimal RIGHT basilar atelectasis.

Aortic Atherosclerosis ([id number].N).

## 2022-06-10 DIAGNOSIS — R5381 Other malaise: Secondary | ICD-10-CM | POA: Diagnosis not present

## 2022-06-10 DIAGNOSIS — K3189 Other diseases of stomach and duodenum: Secondary | ICD-10-CM | POA: Diagnosis not present

## 2022-06-10 DIAGNOSIS — K920 Hematemesis: Secondary | ICD-10-CM | POA: Diagnosis not present

## 2022-06-10 DIAGNOSIS — K257 Chronic gastric ulcer without hemorrhage or perforation: Secondary | ICD-10-CM | POA: Diagnosis not present

## 2022-06-10 DIAGNOSIS — Z5948 Other specified lack of adequate food: Secondary | ICD-10-CM | POA: Diagnosis not present

## 2022-06-10 DIAGNOSIS — K766 Portal hypertension: Secondary | ICD-10-CM | POA: Diagnosis not present

## 2022-06-10 DIAGNOSIS — Z59819 Housing instability, housed unspecified: Secondary | ICD-10-CM | POA: Diagnosis not present

## 2022-06-10 IMAGING — CT CT ANGIO CHEST
2 of 6 series · 17 of 46 positions shown · IV contrast (APPLIED)
Comparison: CT 02/21/2020

CLINICAL DATA: Shortness of breath, tachycardia, concern for
pulmonary embolism

EXAM:
CT ANGIOGRAPHY CHEST WITH CONTRAST
TECHNIQUE: Multidetector CT imaging of the chest was performed using the
standard protocol during bolus administration of intravenous
contrast. Multiplanar CT image reconstructions and MIPs were
obtained to evaluate the vascular anatomy.
CONTRAST:  60mL OMNIPAQUE IOHEXOL 350 MG/ML SOLN

[Series 6: thins · axial · 0.81mm/px · z∈[-536,-289]mm · 15 of 271 slices shown]
[im 12/271  lung]
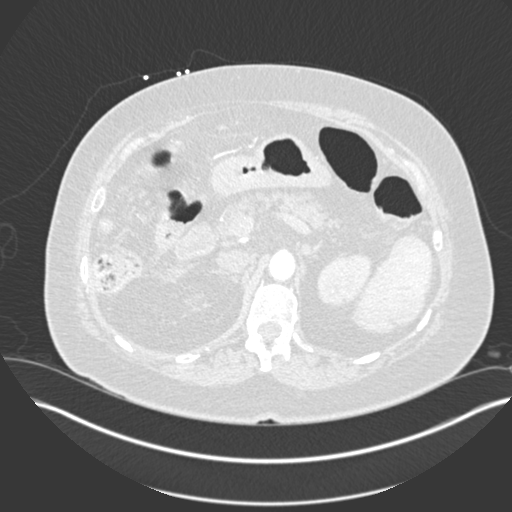
[im 36/271  soft-tissue]
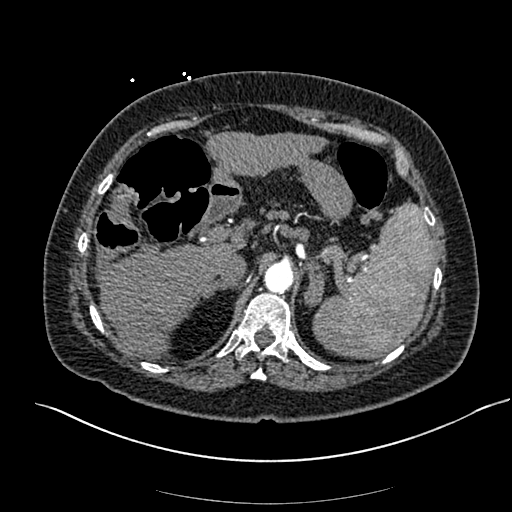
[im 47/271  lung]
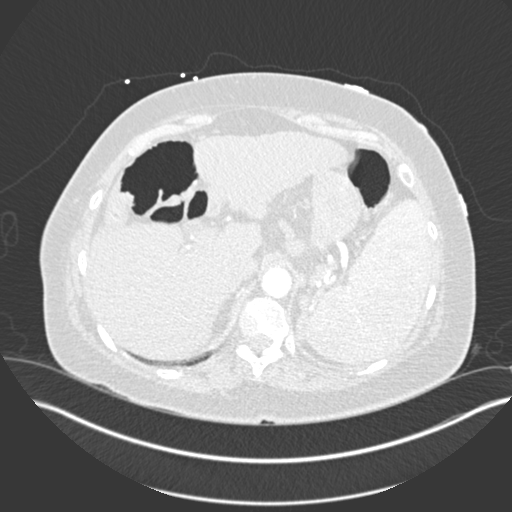
[im 71/271  soft-tissue]
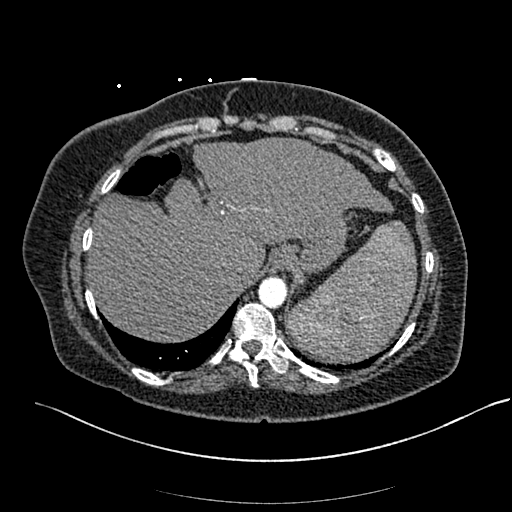
[im 83/271  lung]
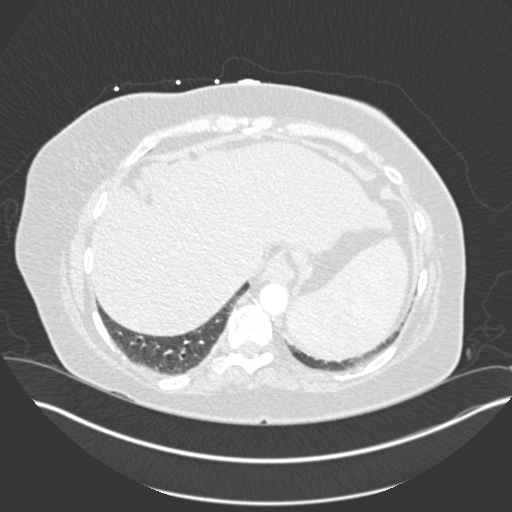
[im 106/271  soft-tissue]
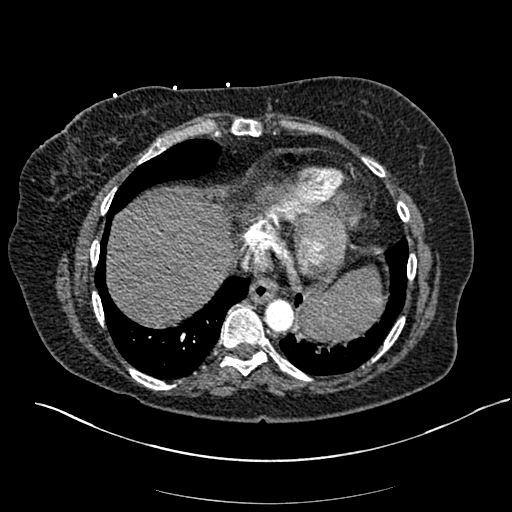
[im 118/271  lung]
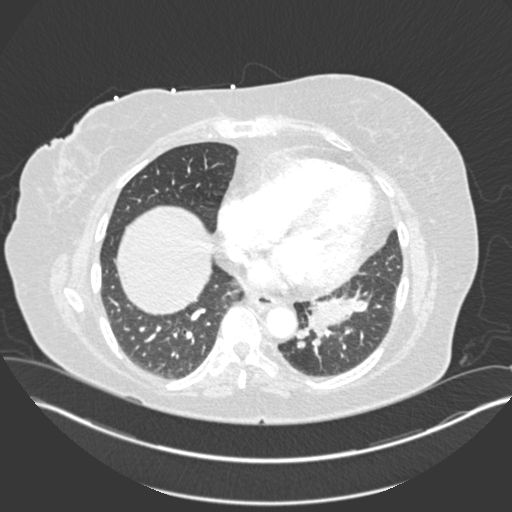
[im 141/271  soft-tissue]
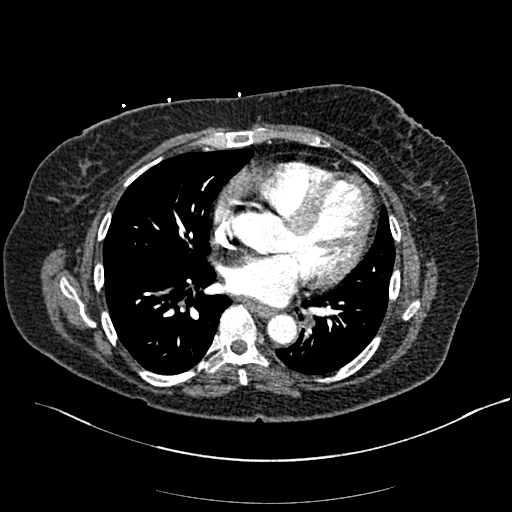
[im 153/271  lung]
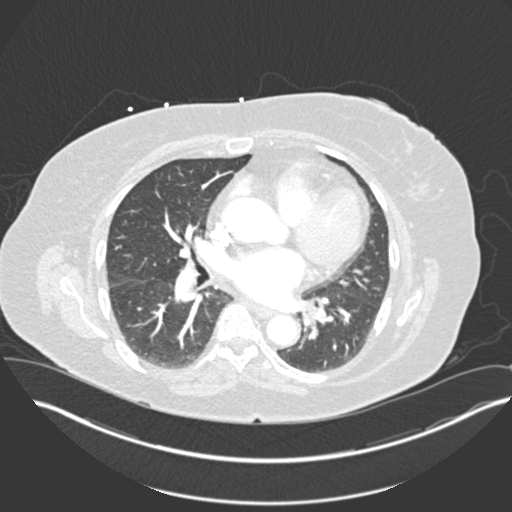
[im 165/271  soft-tissue]
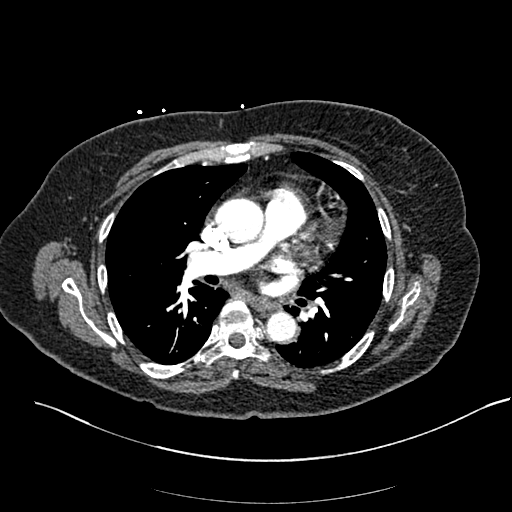
[im 188/271  lung]
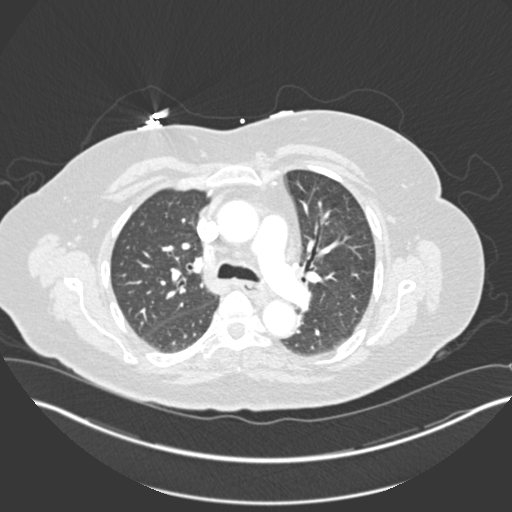
[im 200/271  soft-tissue]
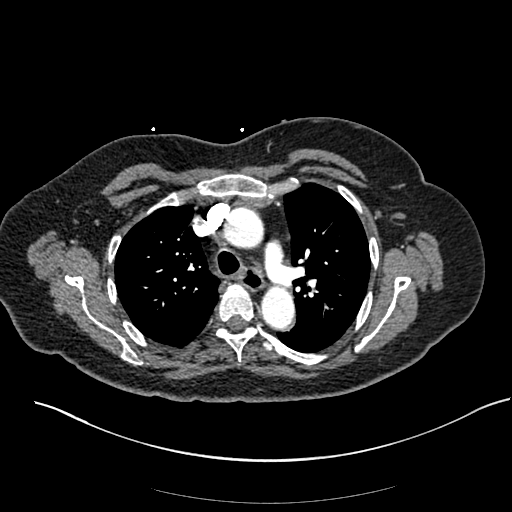
[im 224/271  lung]
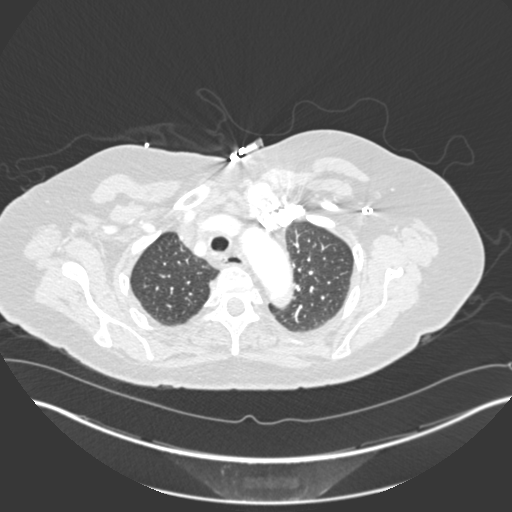
[im 235/271  soft-tissue]
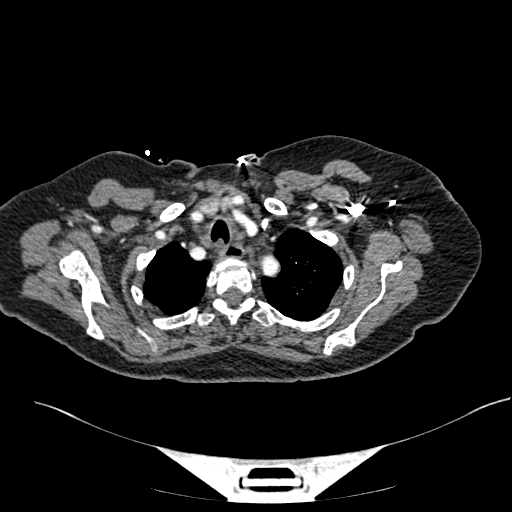
[im 259/271  lung]
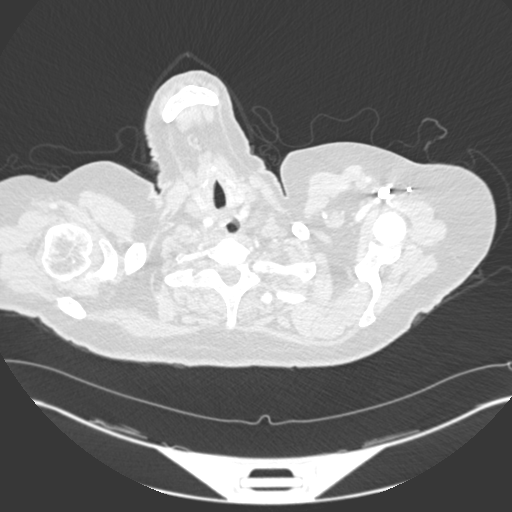

[Series 8: coronal mpr · coronal · 0.54mm/px · 2 of 101 slices shown]
[im 34/101  soft-tissue]
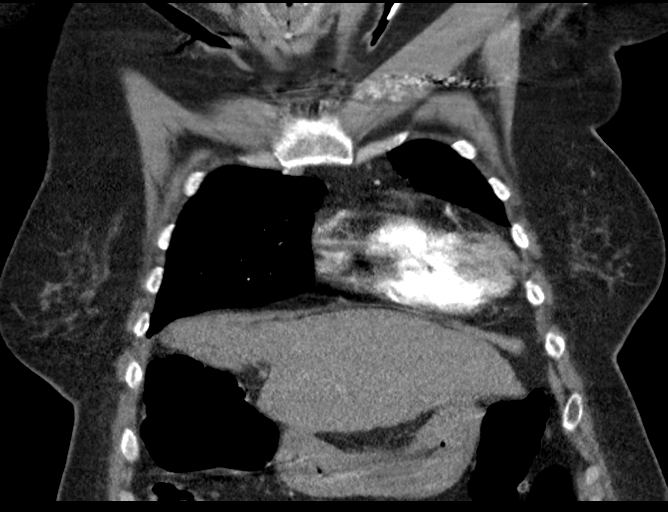
[im 67/101  soft-tissue]
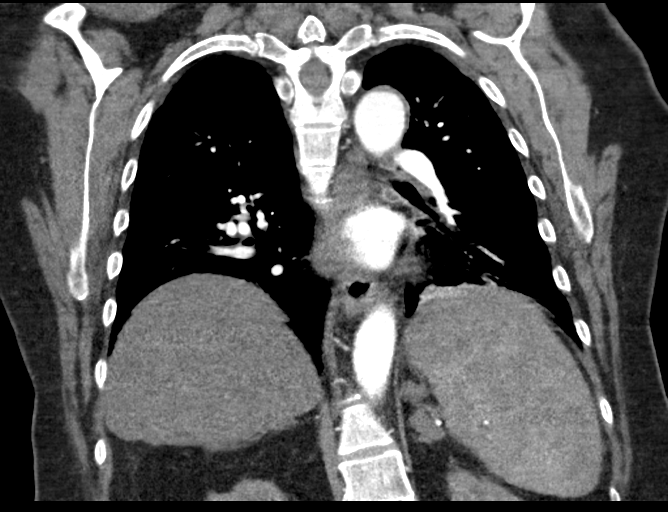

[17 of 46 positions shown; findings below may reference images not displayed]

FINDINGS: Cardiovascular: Satisfactory opacification the pulmonary arteries to
the segmental level. No pulmonary artery filling defects are
identified. Central pulmonary arteries are normal caliber. The
aortic root is suboptimally assessed given cardiac pulsation
artifact. Atherosclerotic plaque within the normal caliber aorta. No
acute luminal abnormality of the imaged aorta. No periaortic
stranding or hemorrhage. Normal 3 vessel branching of the aortic
arch. Proximal great vessels are tortuous and mildly calcified but
otherwise unremarkable. No major venous abnormality is seen. Cardiac
size is within normal limits. Trace pericardial fluid.

Mediastinum/Nodes: No mediastinal fluid or gas. Normal thyroid gland
and thoracic inlet. No acute abnormality of the trachea. There is
some mild circumferential thickening of the lower thoracic esophagus
with some faint paraesophageal stranding. No extraluminal gas or
collection is seen. No worrisome mediastinal, hilar or axillary
adenopathy.

Lungs/Pleura: Lung volumes are low likely accentuated by imaging
during exhalation. There are few fluid-filled airways seen in the
medial basilar segment of the left lower lobe which could reflect
mucous plugging or aspiration. No consolidative airspace opacity is
seen. No convincing features of edema. No pneumothorax or effusion.
No worrisome pulmonary nodules or masses.

Upper Abdomen: Splenomegaly is similar to comparison. Prior
cholecystectomy. Mildly heterogenous and nodular liver compatible
with stigmata of cirrhosis. Upper abdominal venous collateralization
is noted. Mild symmetric bilateral perinephric stranding, a
nonspecific finding which may correlate with advanced age or
decreased renal function.

Musculoskeletal: No acute or worrisome chest wall or osseous
abnormality. Mild degenerative changes in the spine. Scoliotic
curvature of the spine. No worrisome chest wall masses or lesions.

Review of the MIP images confirms the above findings.
IMPRESSION: 1. No evidence of acute pulmonary artery filling defects to suggest
pulmonary embolism.
2. Mild circumferential thickening of the lower thoracic esophagus
with some faint paraesophageal stranding, suggestive of esophagitis.
No extraluminal gas or collection is seen.
3. Few fluid-filled airways in the medial basilar segment of the
left lower lobe could reflect mucous plugging or aspiration. No
consolidative airspace opacity is seen to suggest pneumonia.
4. Stigmata of cirrhosis and sequela of portal hypertension
including splenomegaly and upper abdominal venous collateralization.
5. Mild symmetric bilateral perinephric stranding, a nonspecific
finding which may correlate with advanced age or decreased renal
function though could assess for urinary symptoms and consider
urinalysis if present.
6. Aortic Atherosclerosis ([id number]).

## 2022-06-11 DIAGNOSIS — I10 Essential (primary) hypertension: Secondary | ICD-10-CM | POA: Diagnosis not present

## 2022-06-11 DIAGNOSIS — Z5948 Other specified lack of adequate food: Secondary | ICD-10-CM | POA: Diagnosis not present

## 2022-06-11 DIAGNOSIS — K259 Gastric ulcer, unspecified as acute or chronic, without hemorrhage or perforation: Secondary | ICD-10-CM | POA: Diagnosis not present

## 2022-06-11 DIAGNOSIS — R7309 Other abnormal glucose: Secondary | ICD-10-CM | POA: Diagnosis not present

## 2022-06-11 DIAGNOSIS — R262 Difficulty in walking, not elsewhere classified: Secondary | ICD-10-CM | POA: Diagnosis not present

## 2022-06-11 DIAGNOSIS — N179 Acute kidney failure, unspecified: Secondary | ICD-10-CM | POA: Diagnosis not present

## 2022-06-11 DIAGNOSIS — K257 Chronic gastric ulcer without hemorrhage or perforation: Secondary | ICD-10-CM | POA: Diagnosis not present

## 2022-06-11 DIAGNOSIS — R8569 Abnormal cytological findings in specimens from other digestive organs and abdominal cavity: Secondary | ICD-10-CM | POA: Diagnosis not present

## 2022-06-11 DIAGNOSIS — K3189 Other diseases of stomach and duodenum: Secondary | ICD-10-CM | POA: Diagnosis not present

## 2022-06-11 DIAGNOSIS — K766 Portal hypertension: Secondary | ICD-10-CM | POA: Diagnosis not present

## 2022-06-11 DIAGNOSIS — G9341 Metabolic encephalopathy: Secondary | ICD-10-CM | POA: Diagnosis not present

## 2022-06-11 DIAGNOSIS — R188 Other ascites: Secondary | ICD-10-CM | POA: Diagnosis not present

## 2022-06-11 DIAGNOSIS — I8500 Esophageal varices without bleeding: Secondary | ICD-10-CM | POA: Diagnosis not present

## 2022-06-11 DIAGNOSIS — G4700 Insomnia, unspecified: Secondary | ICD-10-CM | POA: Diagnosis not present

## 2022-06-11 DIAGNOSIS — Z59819 Housing instability, housed unspecified: Secondary | ICD-10-CM | POA: Diagnosis not present

## 2022-06-11 DIAGNOSIS — R2681 Unsteadiness on feet: Secondary | ICD-10-CM | POA: Diagnosis not present

## 2022-06-11 DIAGNOSIS — K7469 Other cirrhosis of liver: Secondary | ICD-10-CM | POA: Diagnosis not present

## 2022-06-11 DIAGNOSIS — D6959 Other secondary thrombocytopenia: Secondary | ICD-10-CM | POA: Diagnosis not present

## 2022-06-11 DIAGNOSIS — R531 Weakness: Secondary | ICD-10-CM | POA: Diagnosis not present

## 2022-06-11 DIAGNOSIS — R5381 Other malaise: Secondary | ICD-10-CM | POA: Diagnosis not present

## 2022-06-11 DIAGNOSIS — M6281 Muscle weakness (generalized): Secondary | ICD-10-CM | POA: Diagnosis not present

## 2022-06-11 DIAGNOSIS — K920 Hematemesis: Secondary | ICD-10-CM | POA: Diagnosis not present

## 2022-06-11 DIAGNOSIS — E7220 Disorder of urea cycle metabolism, unspecified: Secondary | ICD-10-CM | POA: Diagnosis not present

## 2022-06-11 DIAGNOSIS — G894 Chronic pain syndrome: Secondary | ICD-10-CM | POA: Diagnosis not present

## 2022-06-11 IMAGING — DX DG CHEST 1V PORT
1 series · 1 of 1 positions shown · non-contrast
Comparison: PA and lateral chest 07/16/2020 and 07/01/2020. CT
chest 07/17/2020.

CLINICAL DATA: Dry cough.

EXAM:
PORTABLE CHEST 1 VIEW

[chest ap]
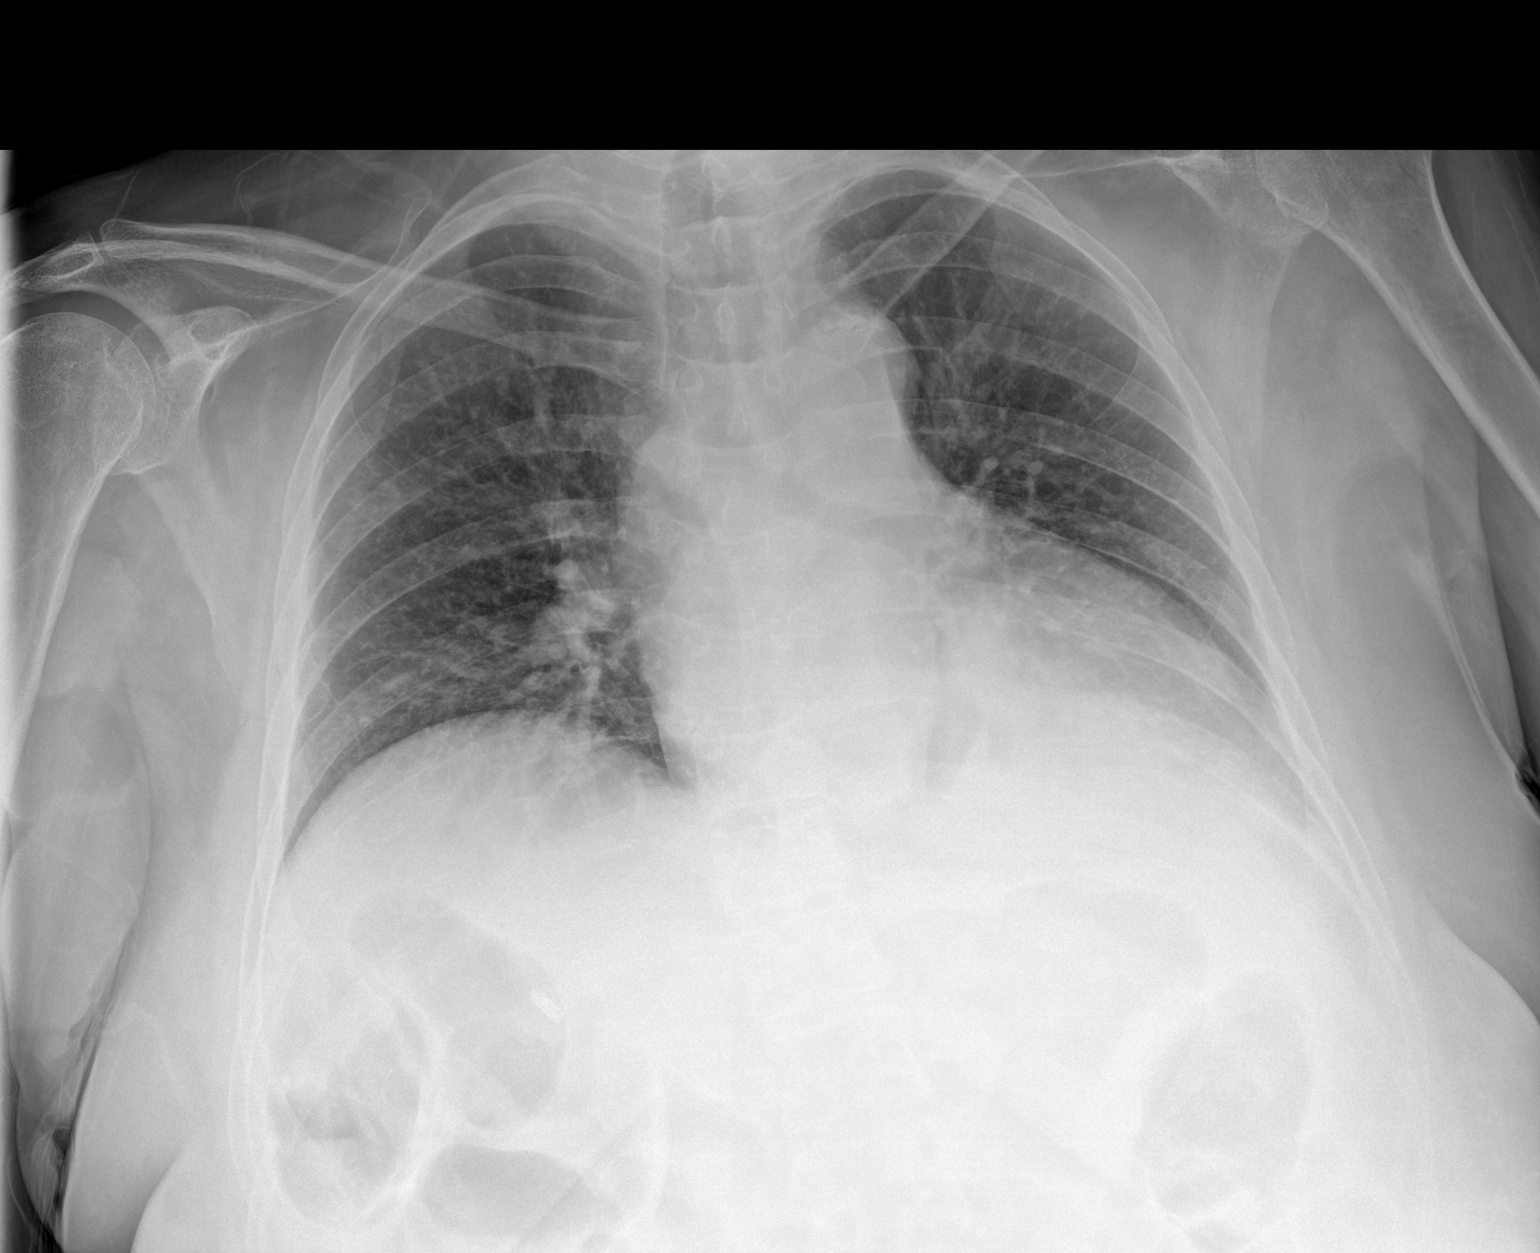

[1 of 1 positions shown; findings below may reference images not displayed]

FINDINGS: There is left basilar airspace disease. The right lung is clear.
Heart size is normal. No pneumothorax or pleural effusion.
IMPRESSION: Left basilar airspace disease which could be due to atelectasis or
pneumonia.

## 2022-06-12 DIAGNOSIS — G9341 Metabolic encephalopathy: Secondary | ICD-10-CM | POA: Diagnosis not present

## 2022-06-12 DIAGNOSIS — K7469 Other cirrhosis of liver: Secondary | ICD-10-CM | POA: Diagnosis not present

## 2022-06-12 DIAGNOSIS — K766 Portal hypertension: Secondary | ICD-10-CM | POA: Diagnosis not present

## 2022-06-12 DIAGNOSIS — I8500 Esophageal varices without bleeding: Secondary | ICD-10-CM | POA: Diagnosis not present

## 2022-06-12 DIAGNOSIS — K920 Hematemesis: Secondary | ICD-10-CM | POA: Diagnosis not present

## 2022-06-12 DIAGNOSIS — M6281 Muscle weakness (generalized): Secondary | ICD-10-CM | POA: Diagnosis not present

## 2022-06-12 DIAGNOSIS — R188 Other ascites: Secondary | ICD-10-CM | POA: Diagnosis not present

## 2022-06-12 DIAGNOSIS — G894 Chronic pain syndrome: Secondary | ICD-10-CM | POA: Diagnosis not present

## 2022-06-12 DIAGNOSIS — I10 Essential (primary) hypertension: Secondary | ICD-10-CM | POA: Diagnosis not present

## 2022-06-15 DIAGNOSIS — I8500 Esophageal varices without bleeding: Secondary | ICD-10-CM | POA: Diagnosis not present

## 2022-06-15 DIAGNOSIS — K7469 Other cirrhosis of liver: Secondary | ICD-10-CM | POA: Diagnosis not present

## 2022-06-15 DIAGNOSIS — K766 Portal hypertension: Secondary | ICD-10-CM | POA: Diagnosis not present

## 2022-06-15 DIAGNOSIS — G894 Chronic pain syndrome: Secondary | ICD-10-CM | POA: Diagnosis not present

## 2022-06-15 DIAGNOSIS — M6281 Muscle weakness (generalized): Secondary | ICD-10-CM | POA: Diagnosis not present

## 2022-06-18 DIAGNOSIS — K7469 Other cirrhosis of liver: Secondary | ICD-10-CM | POA: Diagnosis not present

## 2022-06-18 DIAGNOSIS — I8500 Esophageal varices without bleeding: Secondary | ICD-10-CM | POA: Diagnosis not present

## 2022-06-18 DIAGNOSIS — R188 Other ascites: Secondary | ICD-10-CM | POA: Diagnosis not present

## 2022-06-18 DIAGNOSIS — M6281 Muscle weakness (generalized): Secondary | ICD-10-CM | POA: Diagnosis not present

## 2022-06-18 DIAGNOSIS — K766 Portal hypertension: Secondary | ICD-10-CM | POA: Diagnosis not present

## 2022-06-20 DIAGNOSIS — G4700 Insomnia, unspecified: Secondary | ICD-10-CM | POA: Diagnosis not present

## 2022-06-20 DIAGNOSIS — G894 Chronic pain syndrome: Secondary | ICD-10-CM | POA: Diagnosis not present

## 2022-06-20 DIAGNOSIS — R7309 Other abnormal glucose: Secondary | ICD-10-CM | POA: Diagnosis not present

## 2022-06-21 DIAGNOSIS — M6281 Muscle weakness (generalized): Secondary | ICD-10-CM | POA: Diagnosis not present

## 2022-06-21 DIAGNOSIS — K766 Portal hypertension: Secondary | ICD-10-CM | POA: Diagnosis not present

## 2022-06-21 DIAGNOSIS — G894 Chronic pain syndrome: Secondary | ICD-10-CM | POA: Diagnosis not present

## 2022-06-21 DIAGNOSIS — K7469 Other cirrhosis of liver: Secondary | ICD-10-CM | POA: Diagnosis not present

## 2022-06-21 DIAGNOSIS — K920 Hematemesis: Secondary | ICD-10-CM | POA: Diagnosis not present

## 2022-06-21 DIAGNOSIS — I8500 Esophageal varices without bleeding: Secondary | ICD-10-CM | POA: Diagnosis not present

## 2022-07-01 DIAGNOSIS — I4891 Unspecified atrial fibrillation: Secondary | ICD-10-CM | POA: Diagnosis not present

## 2022-07-01 DIAGNOSIS — E162 Hypoglycemia, unspecified: Secondary | ICD-10-CM | POA: Diagnosis not present

## 2022-07-21 IMAGING — DX DG CHEST 1V PORT
1 series · 1 of 1 positions shown · non-contrast
Comparison: July 18, 2020.

CLINICAL DATA: Fever, cough.

EXAM:
PORTABLE CHEST 1 VIEW

[chest ap]
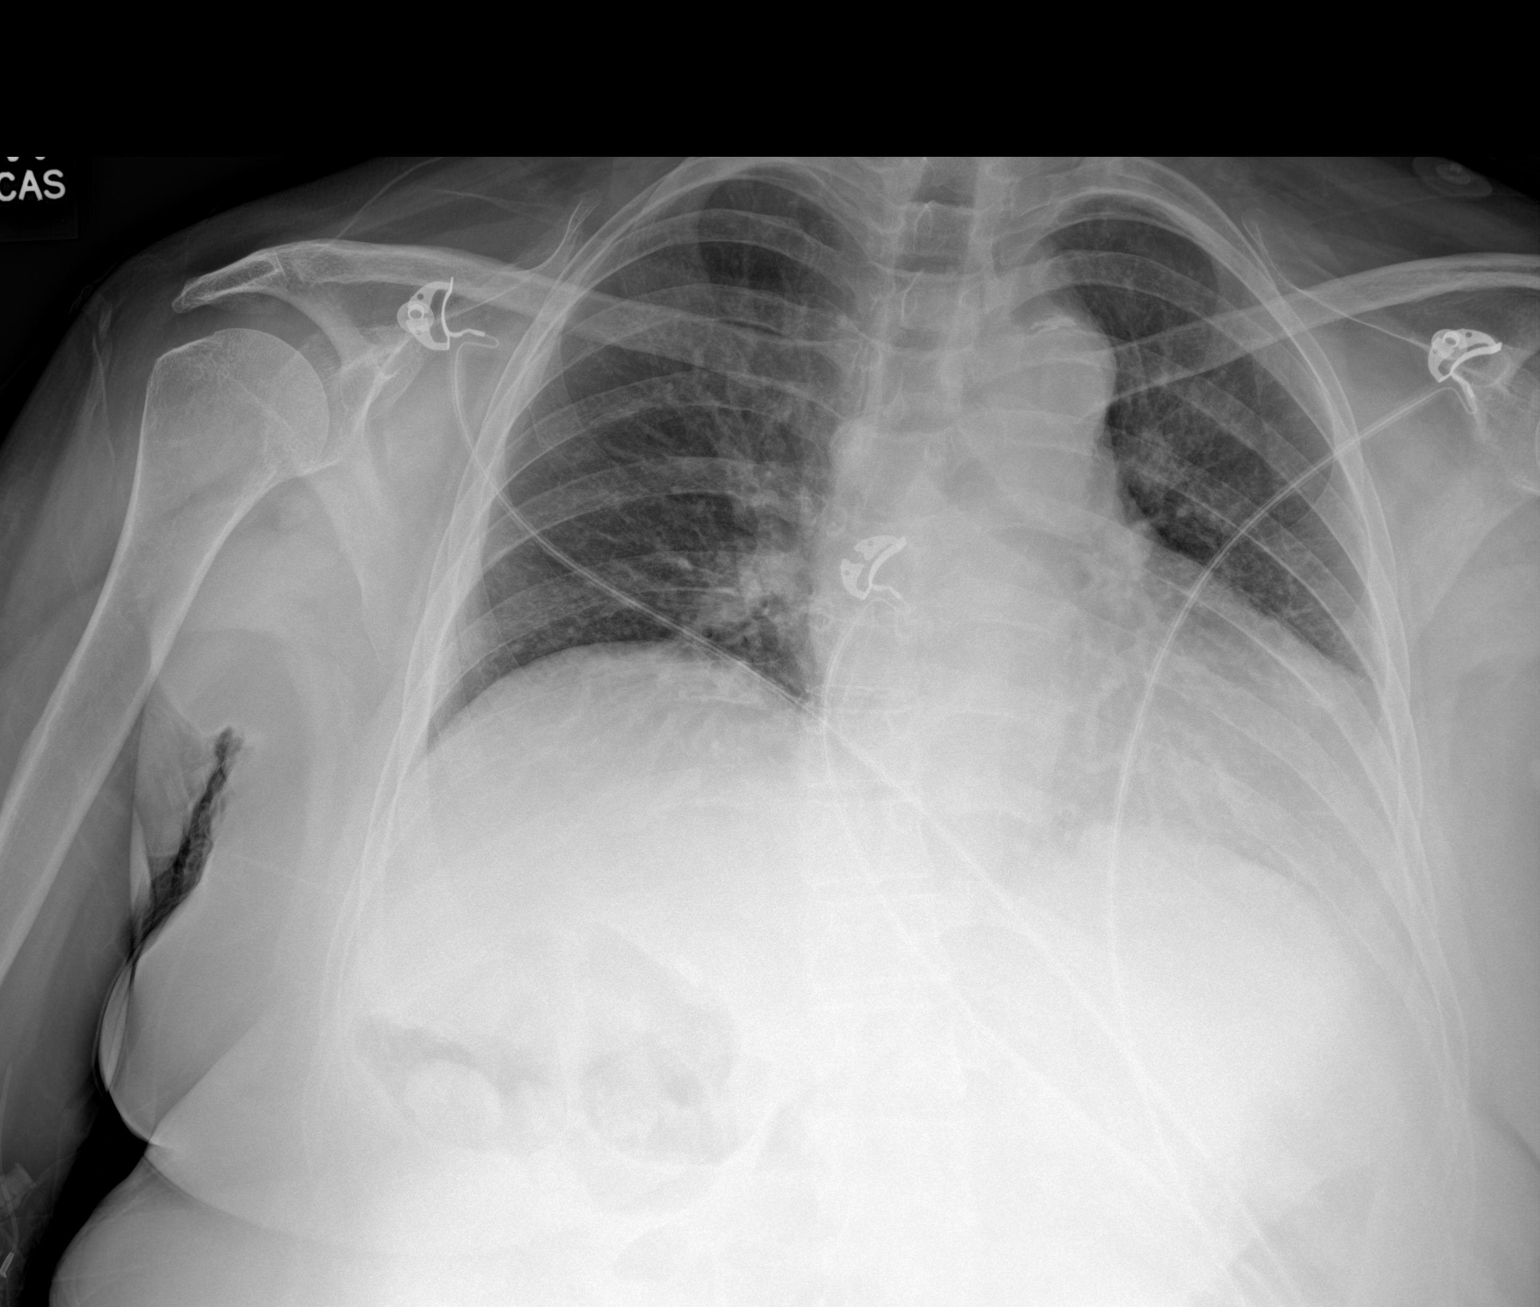

[1 of 1 positions shown; findings below may reference images not displayed]

FINDINGS: Low lung volumes. Streaky left basilar opacities. No visible pleural
effusions or pneumothorax. Mildly enlarged cardiac silhouette.
Aortic atherosclerosis. No acute osseous abnormality.
IMPRESSION: Streaky left basilar opacities, which could represent atelectasis,
aspiration, and/or pneumonia. Dedicated PA and lateral radiographs
could further evaluate if clinically indicated.

## 2022-07-21 IMAGING — CT CT HEAD W/O CM
4 of 7 series · 13 of 47 positions shown, 14 images · non-contrast
Comparison: Brain CT 01/23/2020

CLINICAL DATA: Patient status post fall.  Patient on Eliquis.

EXAM:
CT HEAD WITHOUT CONTRAST
CT CERVICAL SPINE WITHOUT CONTRAST
TECHNIQUE: Multidetector CT imaging of the head and cervical spine was
performed following the standard protocol without intravenous
contrast. Multiplanar CT image reconstructions of the cervical spine
were also generated.

[Series 2: head wo · axial · 0.44mm/px · z∈[-64,+6]mm · 3 of 28 slices shown, 4 images]
[im 7/28  brain]
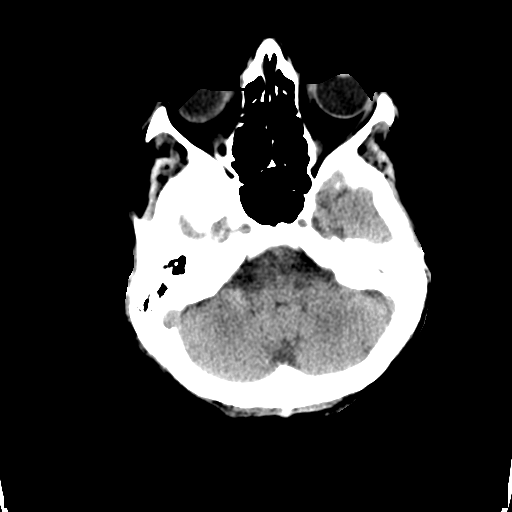
[im 7/28  bone]
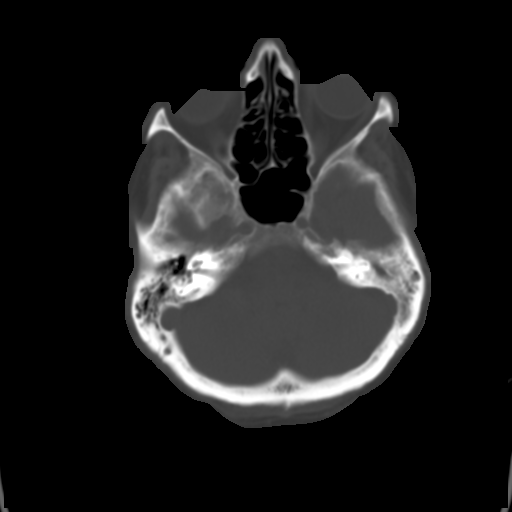
[im 14/28  brain]
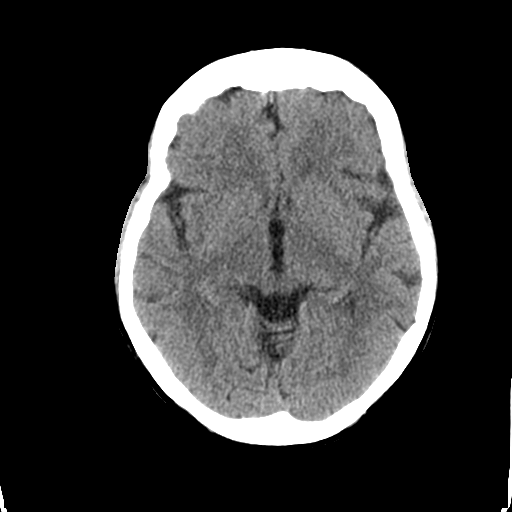
[im 21/28  brain]
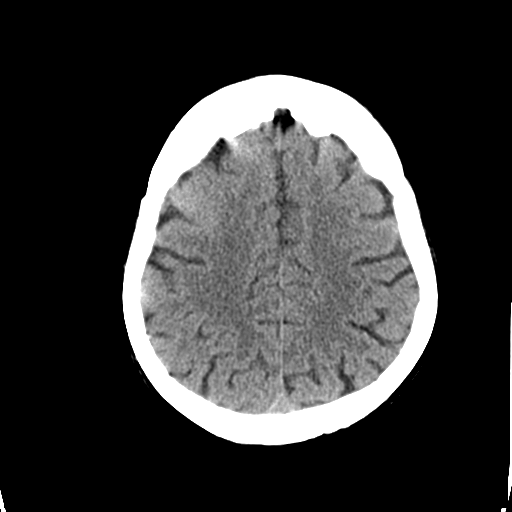

[Series 4: coronal soft tissue · coronal · 0.25mm/px · 3 of 67 slices shown]
[im 7/67  brain]
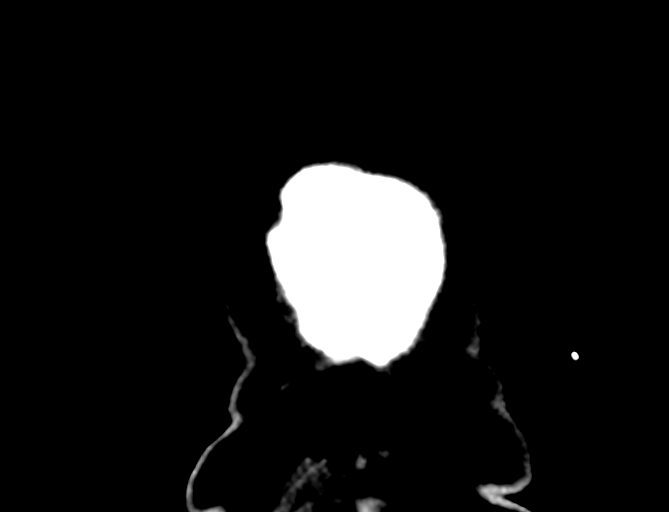
[im 13/67  brain]
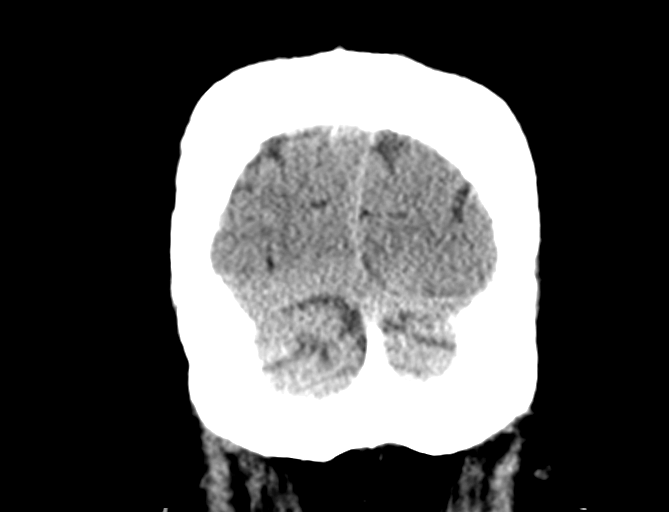
[im 19/67  brain]
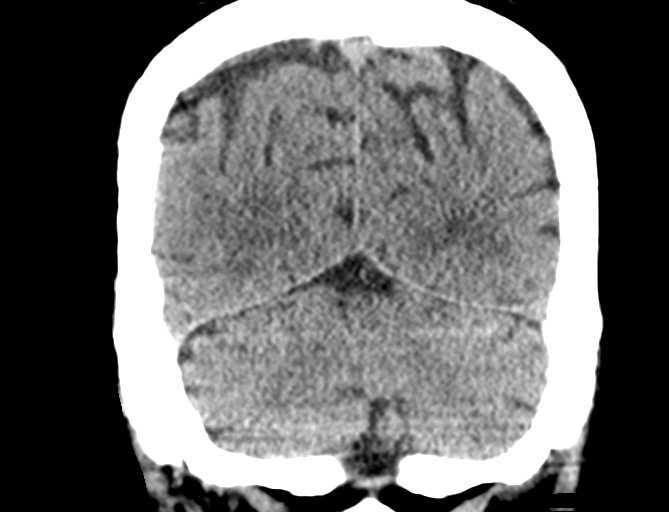

[Series 5: sagittal soft tissue · sagittal · 0.27mm/px · 1 of 56 slices shown]
[im 28/56  brain]
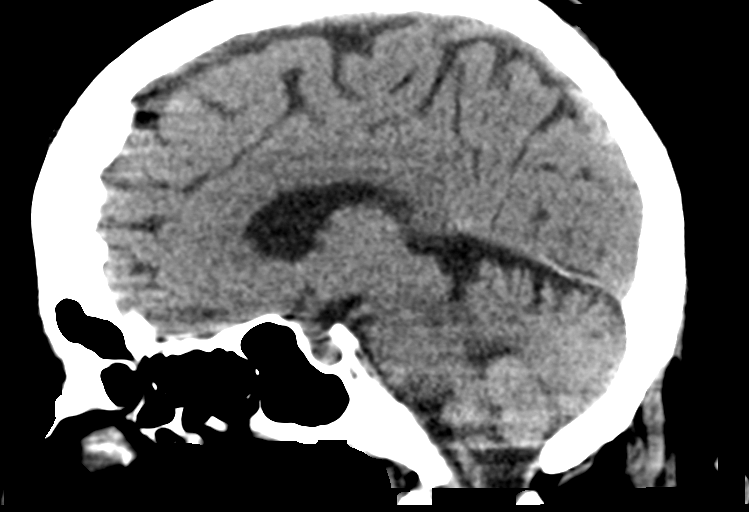

[Series 10: orthogonal bone · axial · 0.25mm/px · z∈[-253,-160]mm · 6 of 92 slices shown]
[im 8/92  bone]
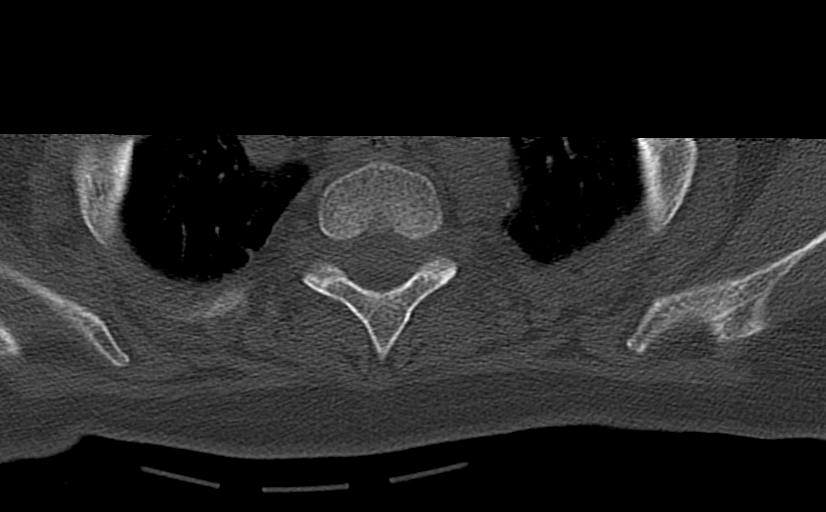
[im 22/92  bone]
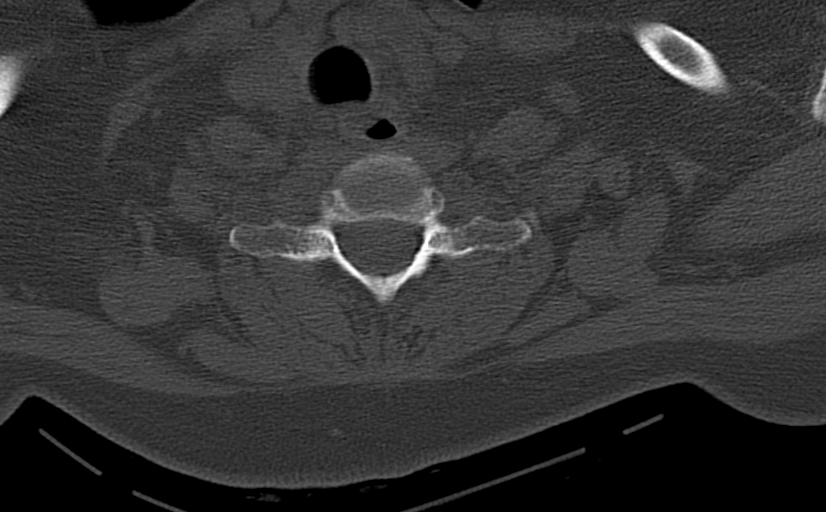
[im 29/92  bone]
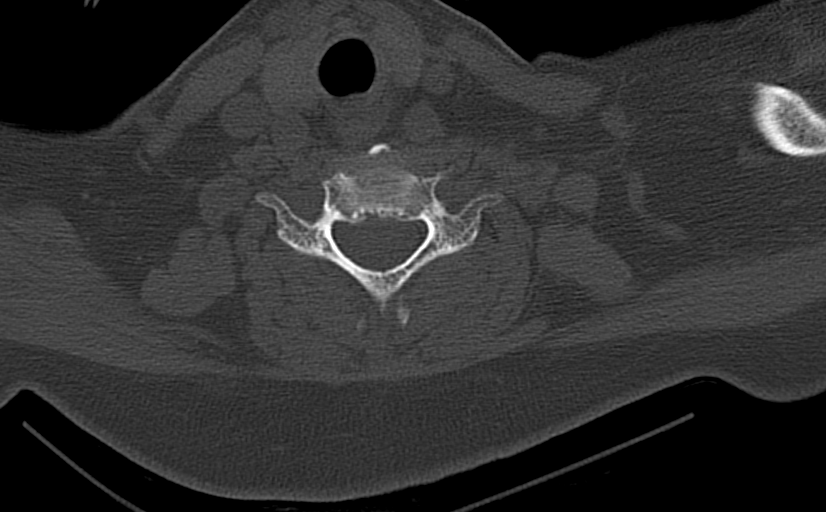
[im 43/92  bone]
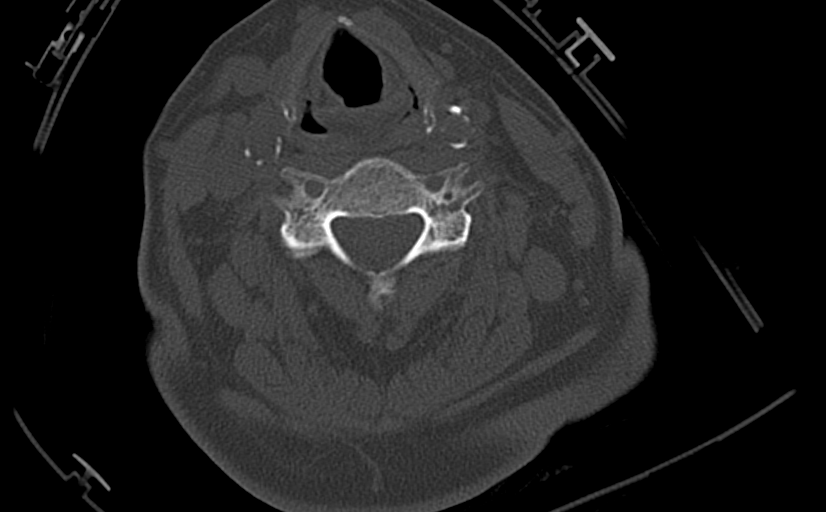
[im 50/92  bone]
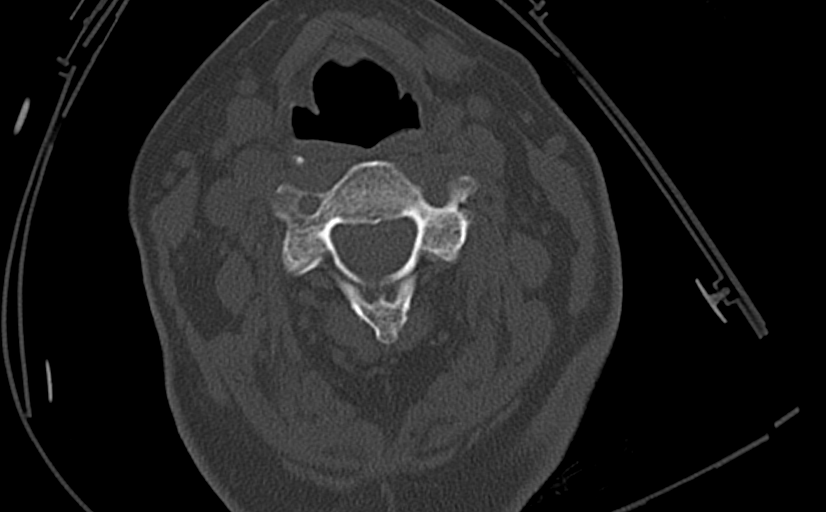
[im 64/92  bone]
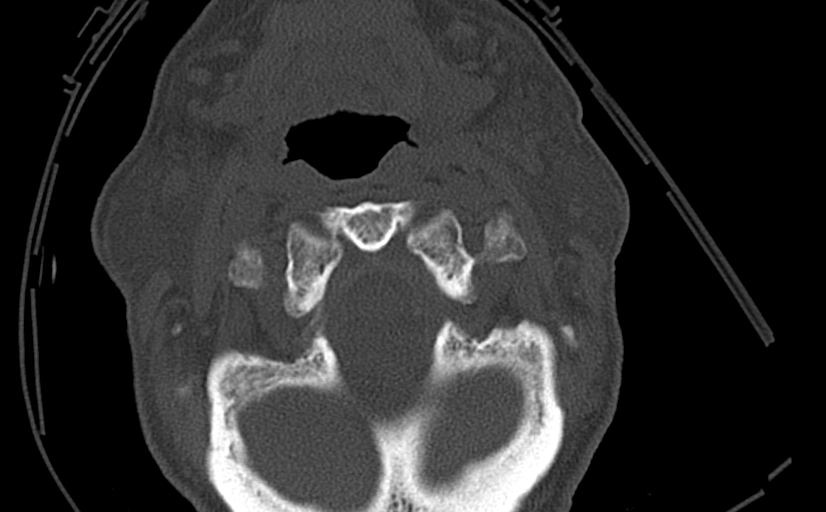

[13 of 47 positions shown; findings below may reference images not displayed]

FINDINGS: CT HEAD FINDINGS

Brain: No evidence of acute infarction, hemorrhage, hydrocephalus,
extra-axial collection or mass lesion/mass effect. Ventricles and
sulci are appropriate patient's age.

Vascular: Unremarkable

Skull: Intact.

Sinuses/Orbits: There is nonspecific opacification of the left
mastoid air cells. Right mastoid air cells are well aerated.
Paranasal sinuses are well aerated.

Other: None.

CT CERVICAL SPINE FINDINGS

Alignment: Normal.

Skull base and vertebrae: No acute fracture. No primary bone lesion
or focal pathologic process.

Soft tissues and spinal canal: No prevertebral fluid or swelling. No
visible canal hematoma.

Disc levels: No acute fracture. Degenerative disc disease most
pronounced C5-6.

Upper chest: Negative.

Other: None
IMPRESSION: 1. No acute intracranial process.
2. No acute cervical spine fracture.
3. Nonspecific opacification of the left mastoid air cells.

## 2022-07-22 IMAGING — MR MR HEAD W/O CM
11 series · 42 of 48 positions shown · non-contrast
Comparison: Prior head CT from 08/27/2020

CLINICAL DATA: Initial evaluation for memory loss.

EXAM:
MRI HEAD WITHOUT CONTRAST
TECHNIQUE: Multiplanar, multiecho pulse sequences of the brain and surrounding
structures were obtained without intravenous contrast.

[Series 5: ax dwi_tracew · axial · 3.0mm · 0.60mm/px · z∈[-63,+86]mm · 5 of 48 slices shown]
[im 1/48]
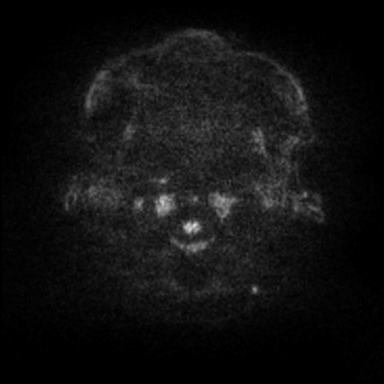
[im 12/48]
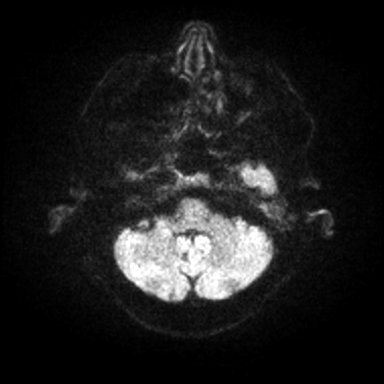
[im 24/48]
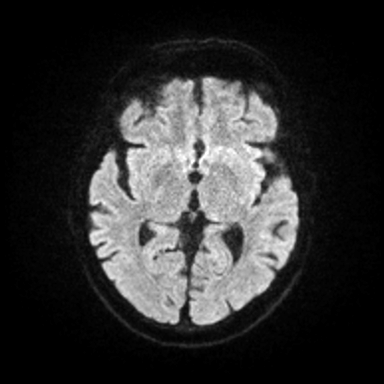
[im 36/48]
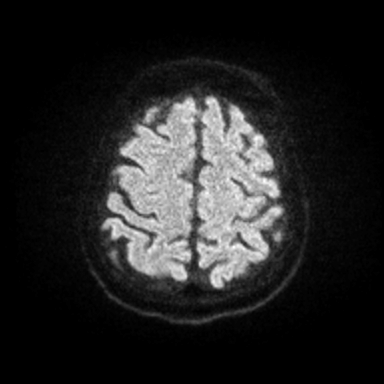
[im 48/48]
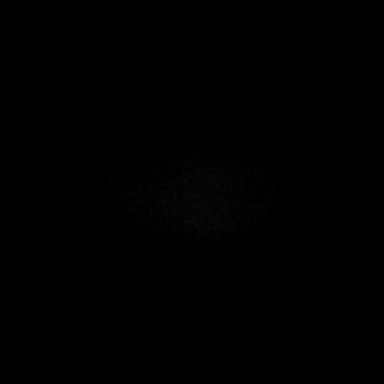

[Series 6: ax dwi_adc · axial · 3.0mm · 0.60mm/px · z∈[-63,+83]mm · 4 of 47 slices shown]
[im 1/47]
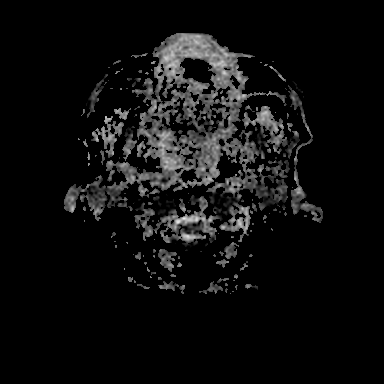
[im 16/47]
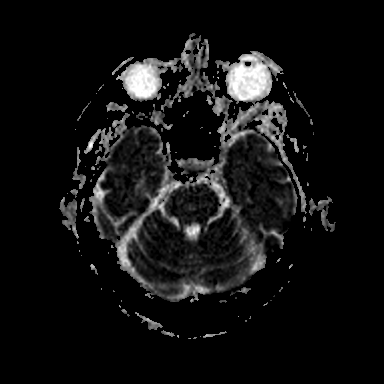
[im 31/47]
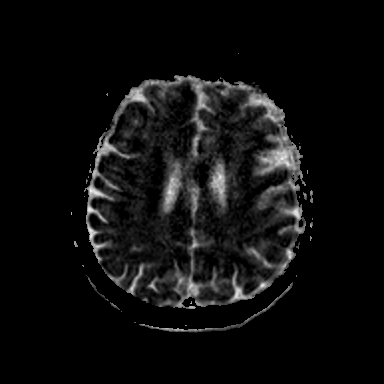
[im 47/47]
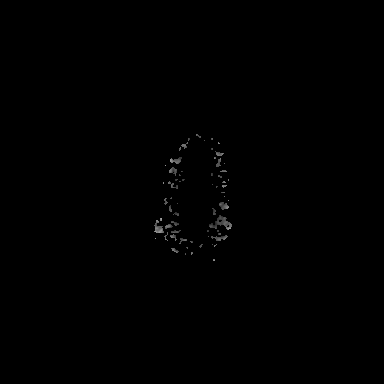

[Series 7: cor dwi_tracew · coronal · 5.0mm · 0.60mm/px · 3 of 36 slices shown]
[im 1/36]
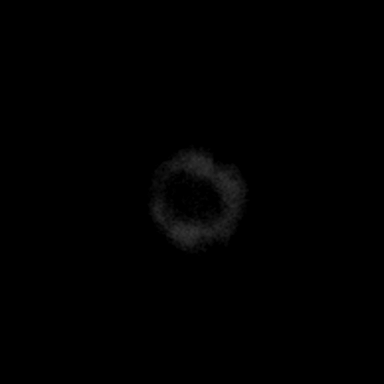
[im 18/36]
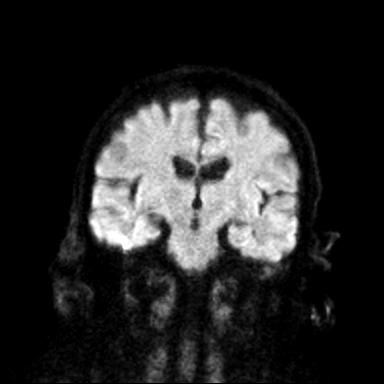
[im 36/36]
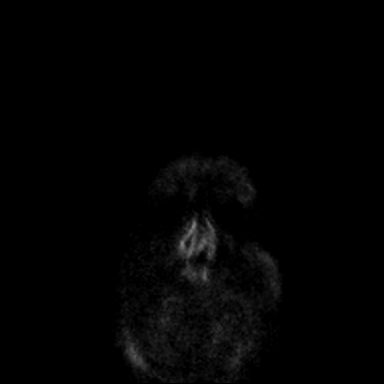

[Series 8: cor dwi_adc · coronal · 5.0mm · 0.60mm/px · 3 of 36 slices shown]
[im 1/36]
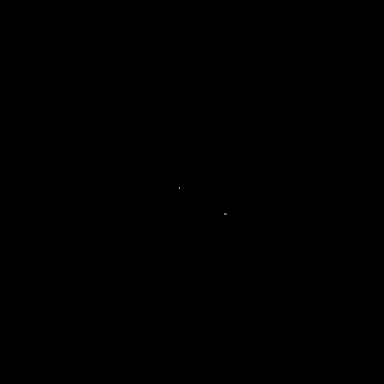
[im 18/36]
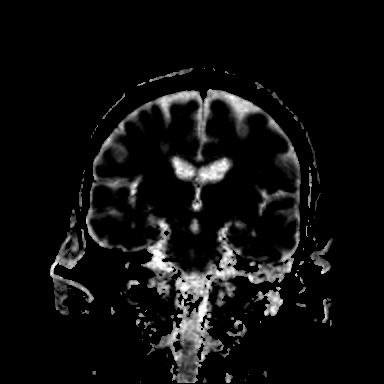
[im 36/36]
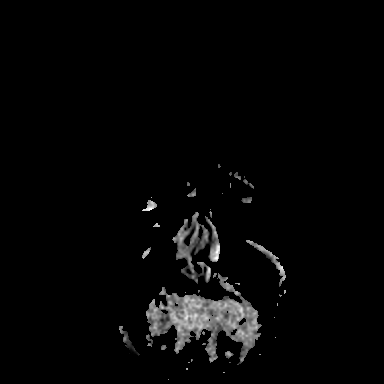

[Series 9: T1 · sagittal · 5.0mm · 0.62mm/px · 2 of 21 slices shown (1 of 2)]
[im 1/21]
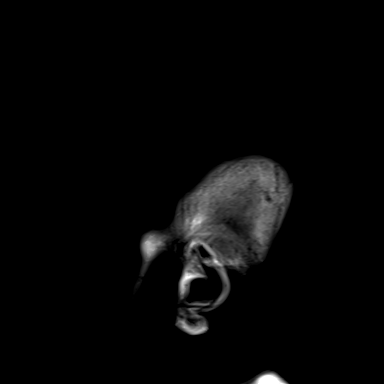
[im 21/21]
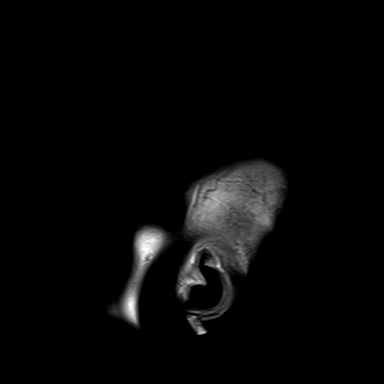

[Series 10: T2 · axial · 5.0mm · 0.45mm/px · z∈[-57,+82]mm · 2 of 25 slices shown (1 of 2)]
[im 1/25]
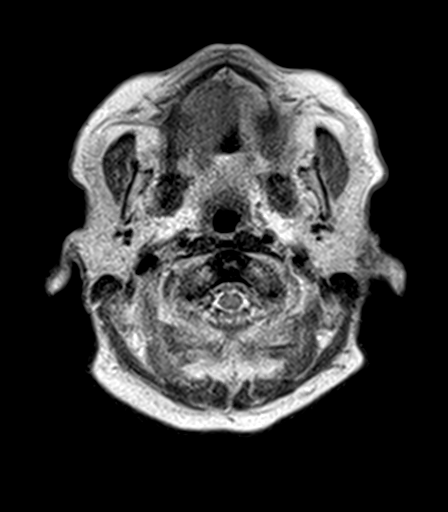
[im 25/25]
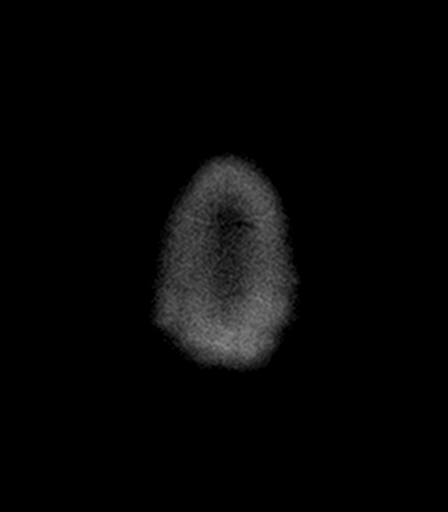

[Series 12: pha_images · axial · 3.0mm · 0.90mm/px · z∈[-59,+83]mm · 4 of 50 slices shown]
[im 1/50]
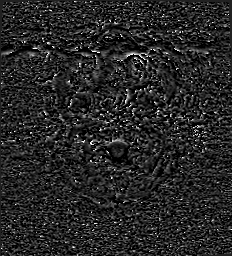
[im 17/50]
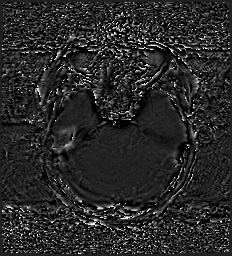
[im 33/50]
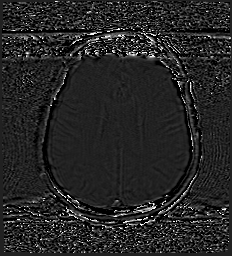
[im 50/50]
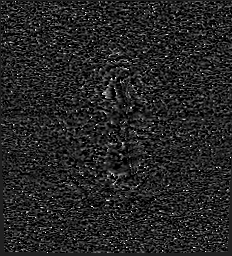

[Series 13: swi_images · axial · 3.0mm · 0.90mm/px · z∈[-61,+86]mm · 4 of 52 slices shown]
[im 1/52]
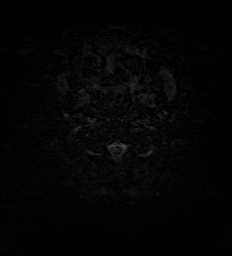
[im 18/52]
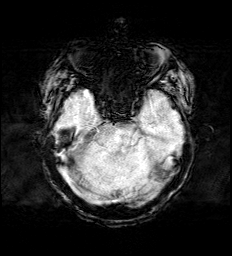
[im 35/52]
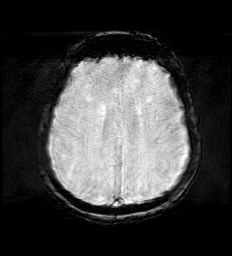
[im 52/52]
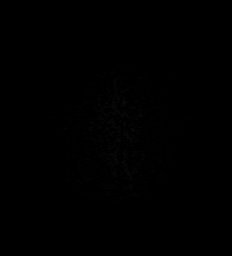

[Series 15: FLAIR · axial · 3.0mm · 1.20mm/px · z∈[-59,+82]mm · 4 of 50 slices shown]
[im 1/50]
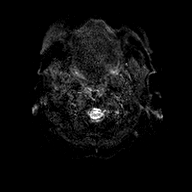
[im 17/50]
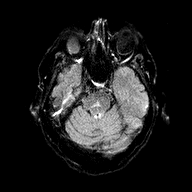
[im 33/50]
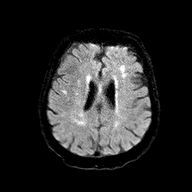
[im 50/50]
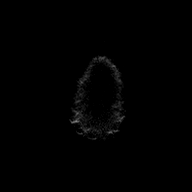

[Series 16: T1 · axial · 1.0mm · 0.98mm/px · z∈[-63,+91]mm · 8 of 160 slices shown (2 of 2)]
[im 1/160]
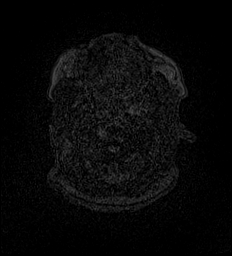
[im 25/160]
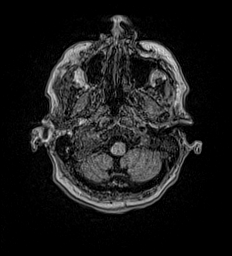
[im 49/160]
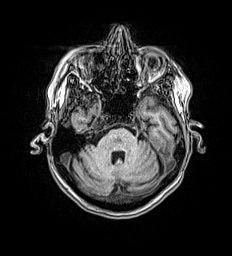
[im 74/160]
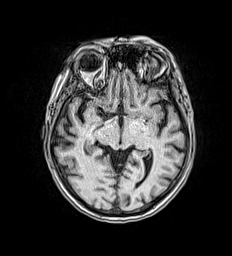
[im 86/160]
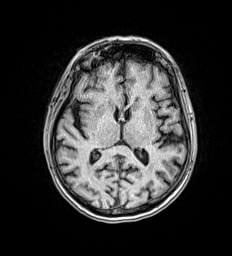
[im 111/160]
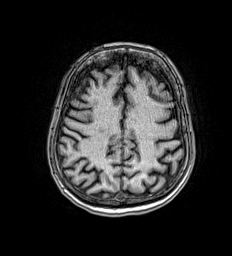
[im 135/160]
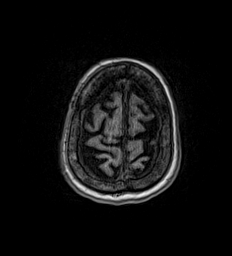
[im 160/160]
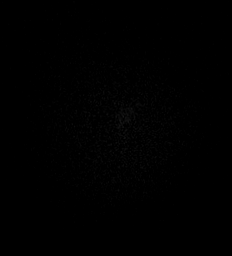

[Series 17: T2 · coronal · 5.0mm · 0.45mm/px · 3 of 31 slices shown (2 of 2)]
[im 1/31]
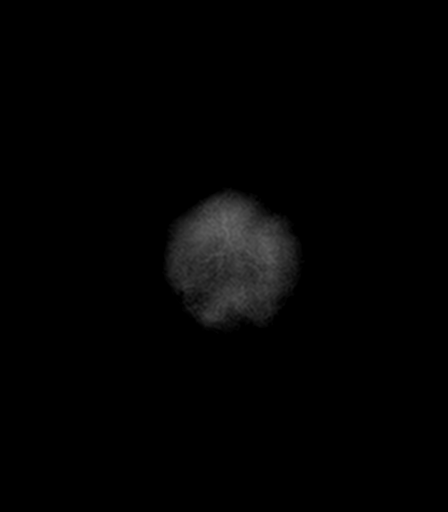
[im 16/31]
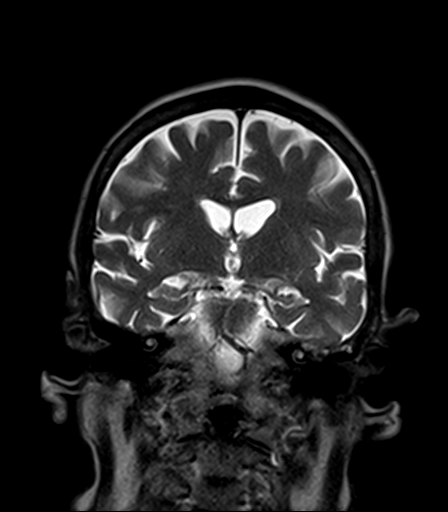
[im 31/31]
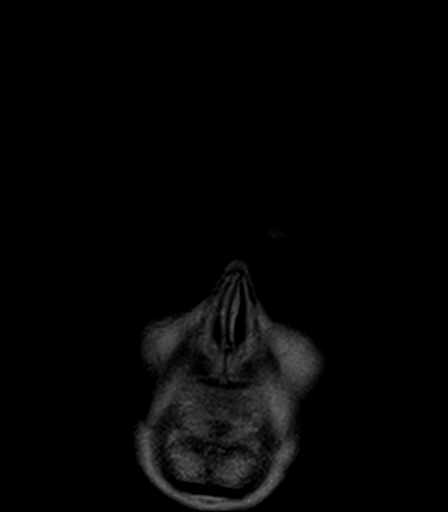

[42 of 48 positions shown; findings below may reference images not displayed]

FINDINGS: Brain: Examination degraded by motion artifact.

Cerebral volume within normal limits for age. Patchy T2/FLAIR
hyperintensity within the periventricular deep white matter both
cerebral hemispheres as well as the pons, nonspecific, but most like
related chronic microvascular ischemic disease, mild to moderate in
nature.

No abnormal foci of restricted diffusion to suggest acute or
subacute ischemia. Gray-white matter differentiation maintained. No
encephalomalacia to suggest chronic cortical infarction. No foci of
susceptibility artifact to suggest acute or chronic intracranial
hemorrhage.

No mass lesion, midline shift or mass effect. No hydrocephalus or
extra-axial fluid collection. Pituitary gland suprasellar region
within normal limits. Midline structures intact.

Vascular: Major intracranial vascular flow voids are maintained.

Skull and upper cervical spine: Craniocervical junction within
normal limits. Bone marrow signal intensity normal. No scalp soft
tissue abnormality.

Sinuses/Orbits: Globes and orbital soft tissues within normal
limits. Paranasal sinuses are largely clear. Left mastoid and middle
ear effusion, of uncertain significance. Visualized nasopharynx
within normal limits. Inner ear structures grossly normal.

Other: None.
IMPRESSION: 1. No acute intracranial abnormality.
2. Mild to moderate chronic microvascular ischemic disease for age.
3. Left mastoid and middle ear effusion, of uncertain significance.
Correlation with physical exam recommended.

## 2022-07-25 DIAGNOSIS — S299XXA Unspecified injury of thorax, initial encounter: Secondary | ICD-10-CM | POA: Diagnosis not present

## 2022-07-25 DIAGNOSIS — Z5901 Sheltered homelessness: Secondary | ICD-10-CM | POA: Diagnosis not present

## 2022-07-25 DIAGNOSIS — Z515 Encounter for palliative care: Secondary | ICD-10-CM | POA: Diagnosis not present

## 2022-07-25 DIAGNOSIS — Z79899 Other long term (current) drug therapy: Secondary | ICD-10-CM | POA: Diagnosis not present

## 2022-07-25 DIAGNOSIS — R5381 Other malaise: Secondary | ICD-10-CM | POA: Diagnosis not present

## 2022-07-25 DIAGNOSIS — S80212A Abrasion, left knee, initial encounter: Secondary | ICD-10-CM | POA: Diagnosis not present

## 2022-07-25 DIAGNOSIS — E860 Dehydration: Secondary | ICD-10-CM | POA: Diagnosis not present

## 2022-07-25 DIAGNOSIS — R410 Disorientation, unspecified: Secondary | ICD-10-CM | POA: Diagnosis not present

## 2022-07-25 DIAGNOSIS — S0990XA Unspecified injury of head, initial encounter: Secondary | ICD-10-CM | POA: Diagnosis not present

## 2022-07-25 DIAGNOSIS — M25461 Effusion, right knee: Secondary | ICD-10-CM | POA: Diagnosis not present

## 2022-07-25 DIAGNOSIS — M25462 Effusion, left knee: Secondary | ICD-10-CM | POA: Diagnosis not present

## 2022-07-25 DIAGNOSIS — S199XXA Unspecified injury of neck, initial encounter: Secondary | ICD-10-CM | POA: Diagnosis not present

## 2022-07-25 DIAGNOSIS — M25561 Pain in right knee: Secondary | ICD-10-CM | POA: Diagnosis not present

## 2022-07-25 DIAGNOSIS — K7581 Nonalcoholic steatohepatitis (NASH): Secondary | ICD-10-CM | POA: Diagnosis not present

## 2022-07-25 DIAGNOSIS — S3991XA Unspecified injury of abdomen, initial encounter: Secondary | ICD-10-CM | POA: Diagnosis not present

## 2022-07-25 DIAGNOSIS — N179 Acute kidney failure, unspecified: Secondary | ICD-10-CM | POA: Diagnosis not present

## 2022-07-25 DIAGNOSIS — S8991XA Unspecified injury of right lower leg, initial encounter: Secondary | ICD-10-CM | POA: Diagnosis not present

## 2022-08-01 DIAGNOSIS — E162 Hypoglycemia, unspecified: Secondary | ICD-10-CM | POA: Diagnosis not present

## 2022-08-01 DIAGNOSIS — I4891 Unspecified atrial fibrillation: Secondary | ICD-10-CM | POA: Diagnosis not present

## 2022-08-21 IMAGING — CT CT CERVICAL SPINE W/O CM
2 series · 12 of 27 positions shown, 15 images · non-contrast
Comparison: None.

CLINICAL DATA: Fall with head and neck trauma.  Left-sided weakness

EXAM:
CT HEAD WITHOUT CONTRAST
CT CERVICAL SPINE WITHOUT CONTRAST
TECHNIQUE: Multidetector CT imaging of the head and cervical spine was
performed following the standard protocol without intravenous
contrast. Multiplanar CT image reconstructions of the cervical spine
were also generated.

[Series 3: c spine soft · axial · 0.32mm/px · z∈[+764,+894]mm · 7 of 77 slices shown, 9 images]
[im 6/77  soft-tissue]
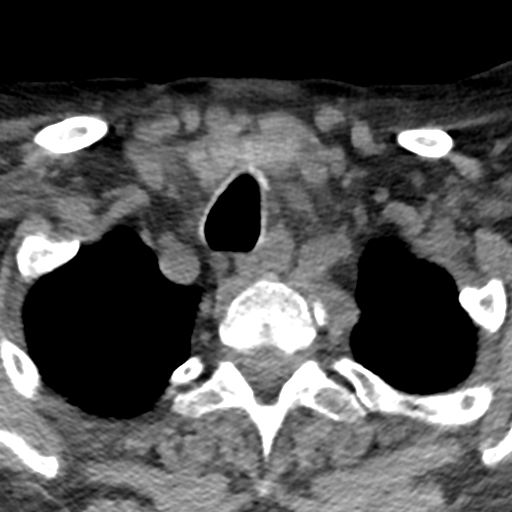
[im 6/77  bone]
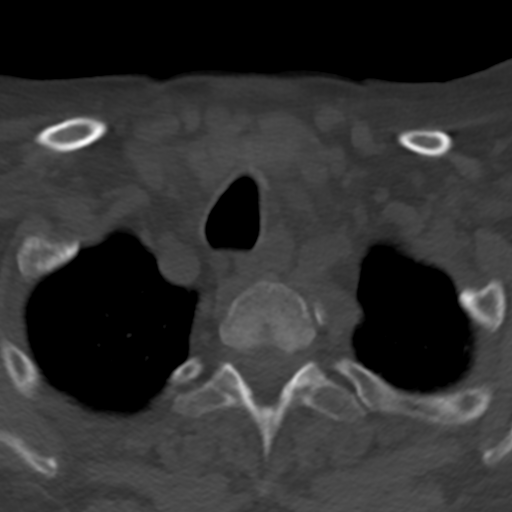
[im 18/77  bone]
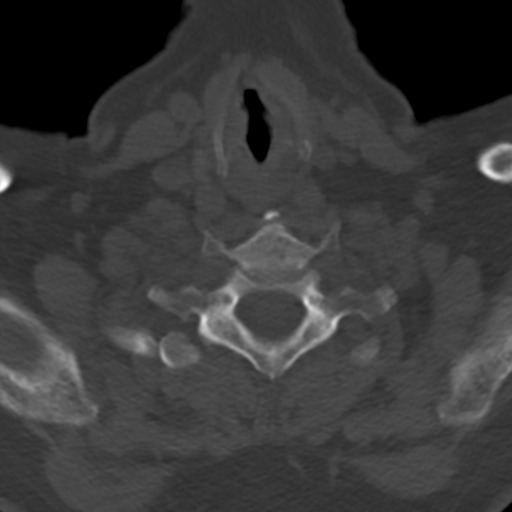
[im 30/77  bone]
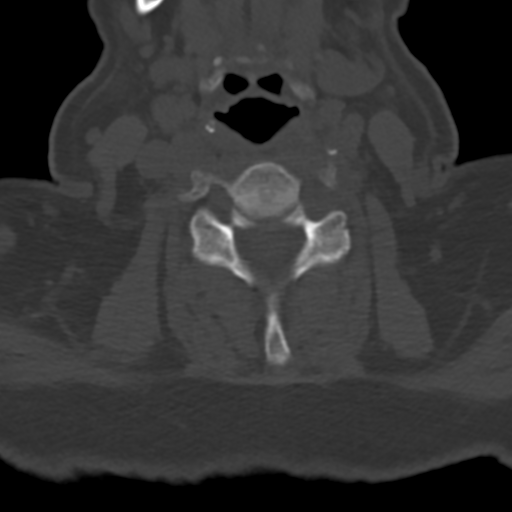
[im 41/77  bone]
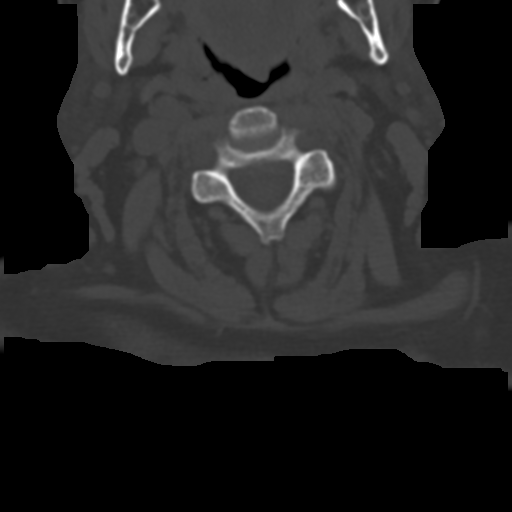
[im 47/77  soft-tissue]
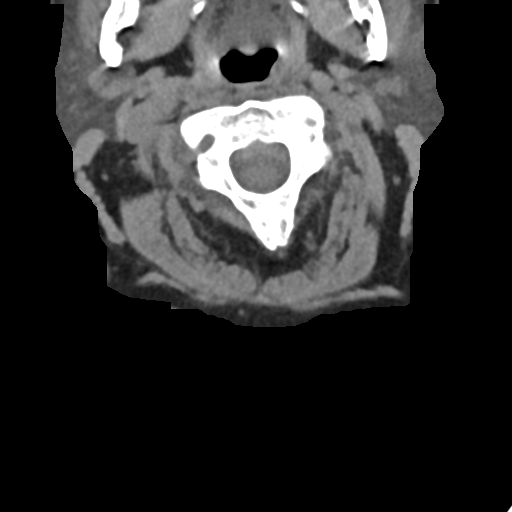
[im 47/77  bone]
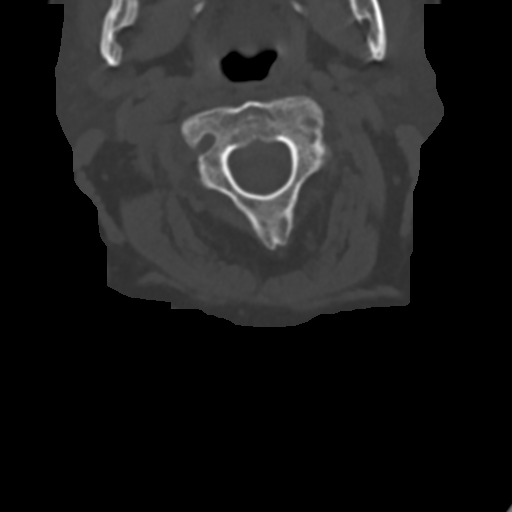
[im 59/77  bone]
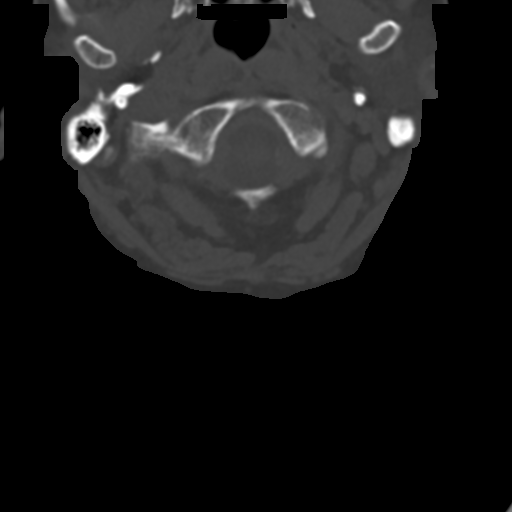
[im 71/77  bone]
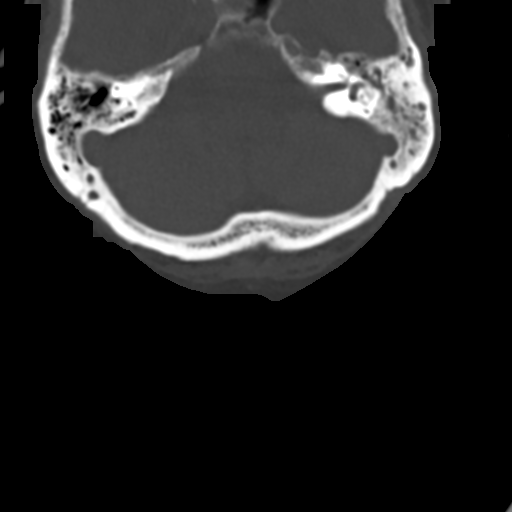

[Series 6: sagittal bone · sagittal · 0.23mm/px · 5 of 57 slices shown, 6 images]
[im 19/57  bone]
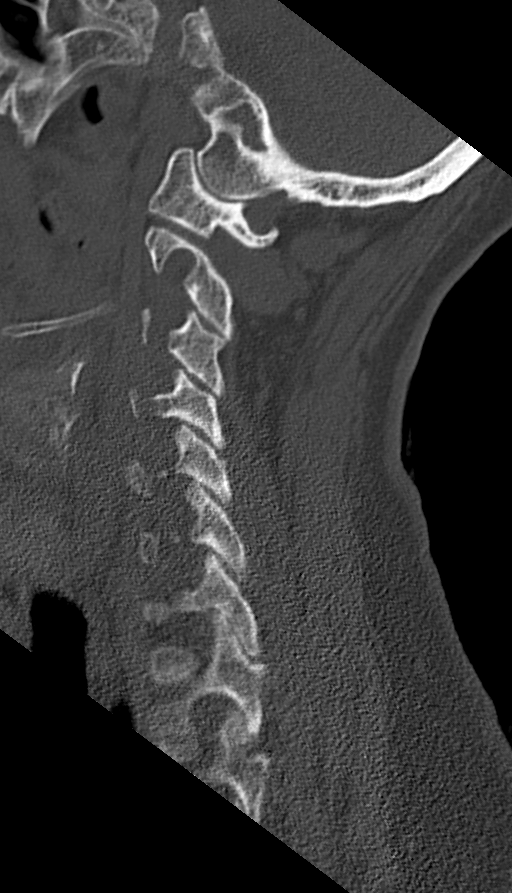
[im 24/57  bone]
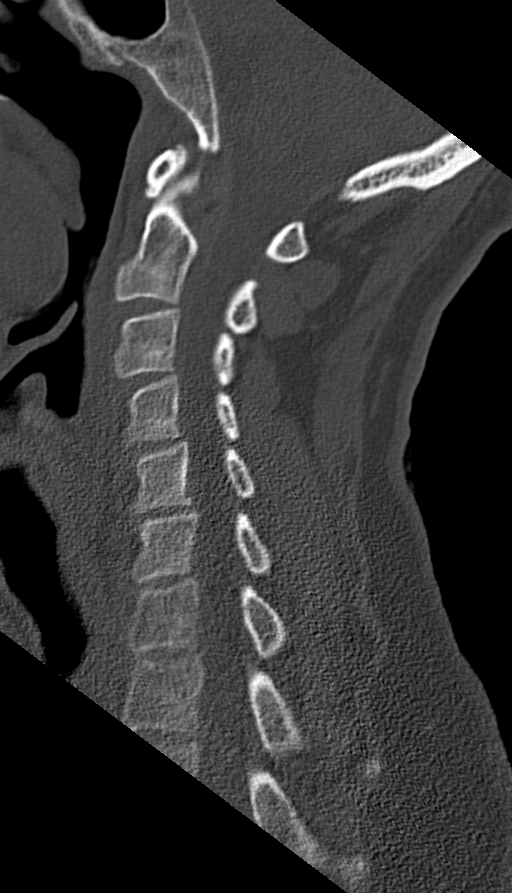
[im 29/57  soft-tissue]
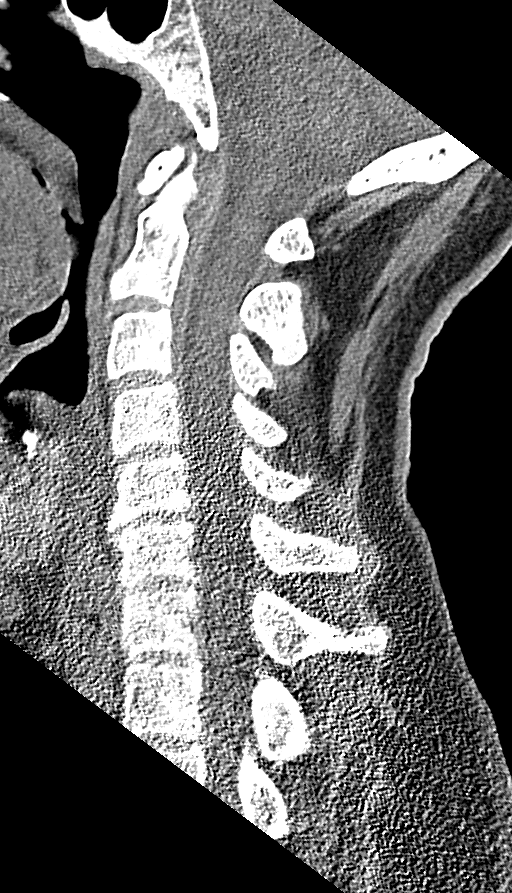
[im 29/57  bone]
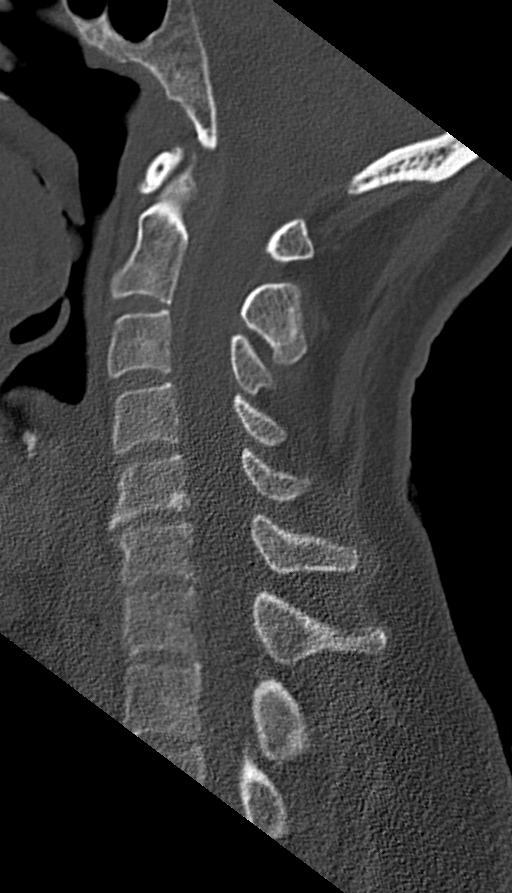
[im 33/57  bone]
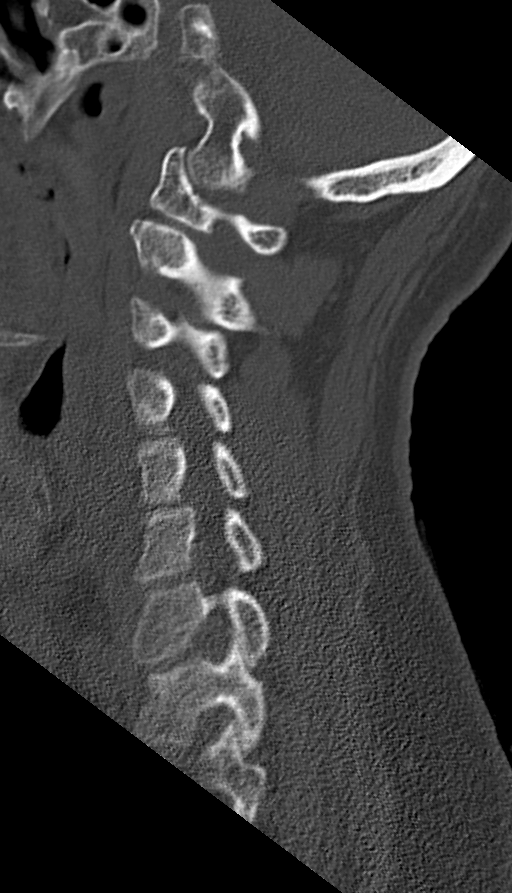
[im 38/57  bone]
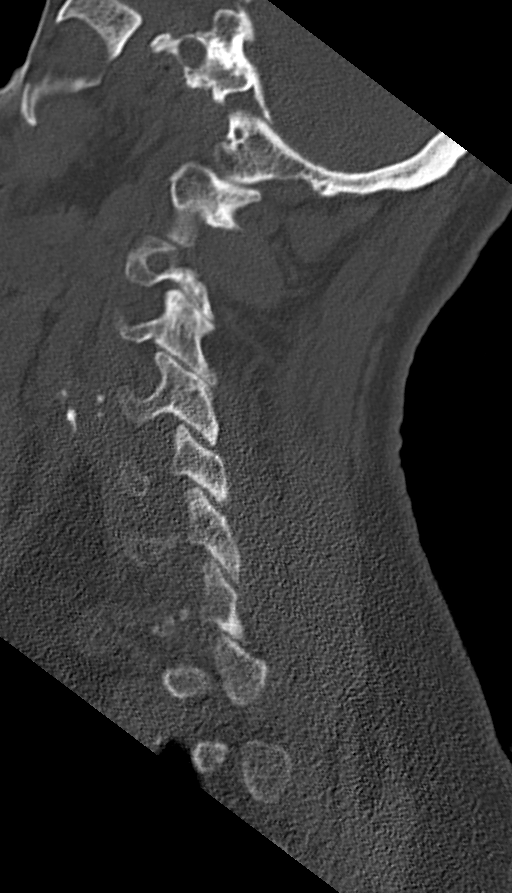

[12 of 27 positions shown; findings below may reference images not displayed]

FINDINGS: CT HEAD FINDINGS

Brain: No evidence of acute infarction, hemorrhage, hydrocephalus,
extra-axial collection or mass lesion/mass effect. Scattered
low-density changes within the periventricular and subcortical white
matter compatible with chronic microvascular ischemic change. Mild
diffuse cerebral volume loss.

Vascular: Atherosclerotic calcifications involving the large vessels
of the skull base. No unexpected hyperdense vessel.

Skull: Normal. Negative for fracture or focal lesion.

Sinuses/Orbits: New near complete opacification of the right frontal
sinus. Chronic partial opacification of the left mastoid air cells.
Visualized orbital structures appear within normal limits.

Other: None.

CT CERVICAL SPINE FINDINGS

Alignment: Facet joints are aligned without dislocation or traumatic
listhesis. Dens and lateral masses are aligned. Straightening of the
cervical lordosis.

Skull base and vertebrae: No acute fracture. No primary bone lesion
or focal pathologic process.

Soft tissues and spinal canal: No prevertebral fluid or swelling. No
visible canal hematoma.

Disc levels: Mild degenerative disc disease of C5-6. Findings
similar to prior.

Upper chest: Included lung apices are clear.

Other: None.
IMPRESSION: 1. No acute intracranial findings.
2. No evidence of acute fracture or traumatic listhesis of the
cervical spine.
3. New right frontal sinus opacification.  Correlate for sinusitis.
4. Chronic nonspecific partial opacification of the left mastoid air
cells.

## 2022-08-21 IMAGING — MR MR HEAD W/O CM
12 of 13 series · 40 of 48 positions shown · non-contrast
Comparison: Prior CT from earlier the same day.

CLINICAL DATA: Initial evaluation for acute stroke.

EXAM:
MRI HEAD WITHOUT CONTRAST
MRA HEAD WITHOUT CONTRAST
TECHNIQUE: Multiplanar, multiecho pulse sequences of the brain and surrounding
structures were obtained without intravenous contrast. Angiographic
images of the head were obtained using MRA technique without
contrast.

[Series 5: ax dwi_tracew · axial · 3.0mm · 0.60mm/px · z∈[-119,+35]mm · 3 of 48 slices shown (1 of 2)]
[im 1/48]
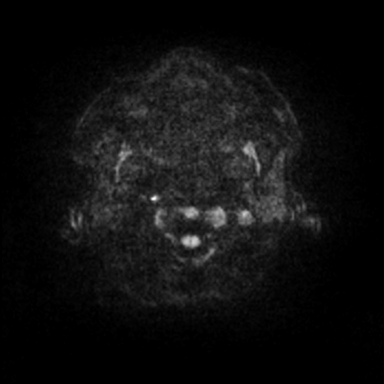
[im 24/48]
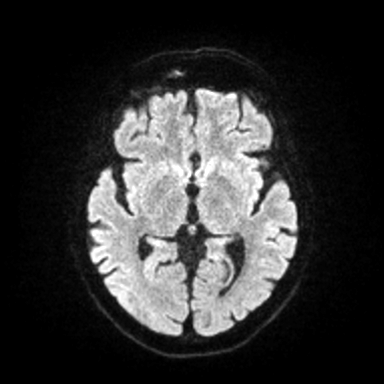
[im 48/48]
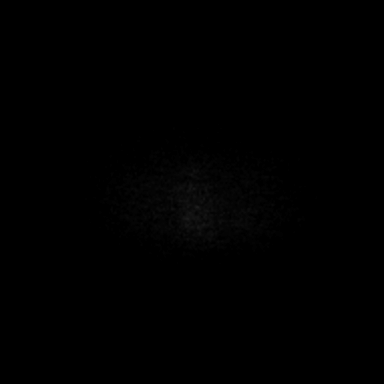

[Series 6: ax dwi_adc · axial · 3.0mm · 0.60mm/px · z∈[-119,+32]mm · 3 of 47 slices shown (1 of 2)]
[im 1/47]
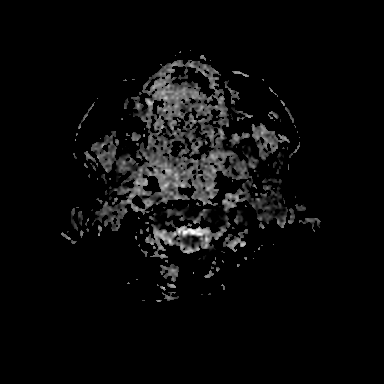
[im 24/47]
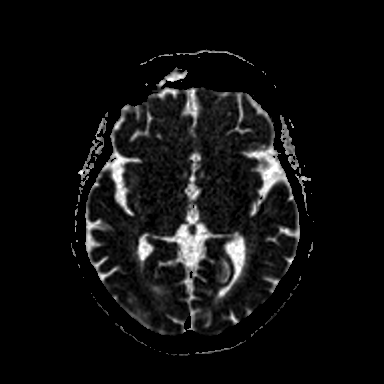
[im 47/47]
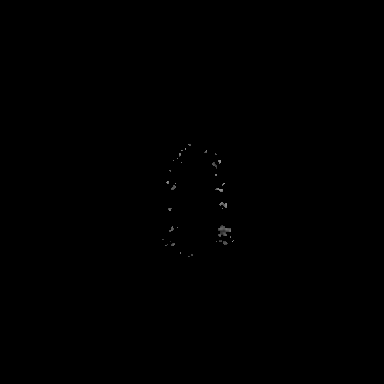

[Series 7: cor dwi_tracew · coronal · 5.0mm · 0.60mm/px · 3 of 36 slices shown]
[im 1/36]
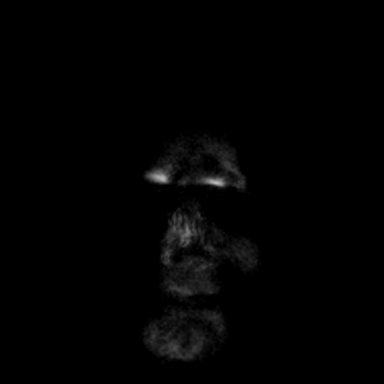
[im 18/36]
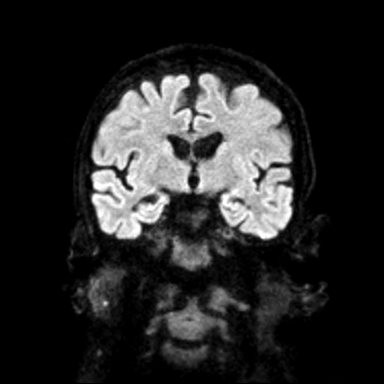
[im 36/36]
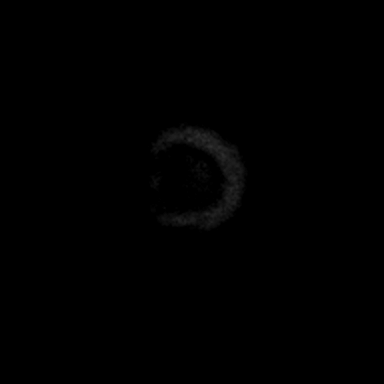

[Series 8: cor dwi_adc · coronal · 5.0mm · 0.60mm/px · 3 of 36 slices shown]
[im 1/36]
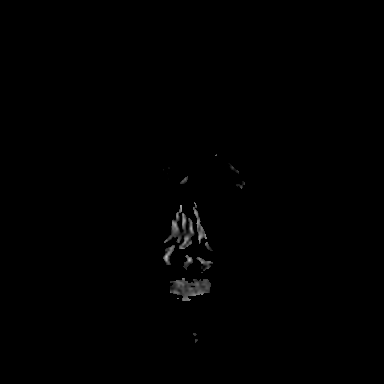
[im 18/36]
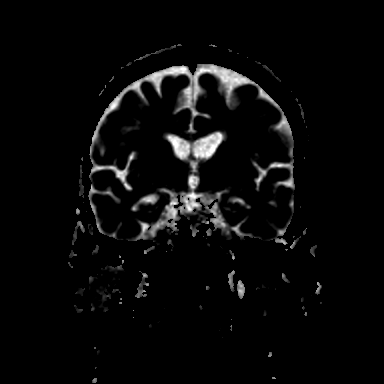
[im 36/36]
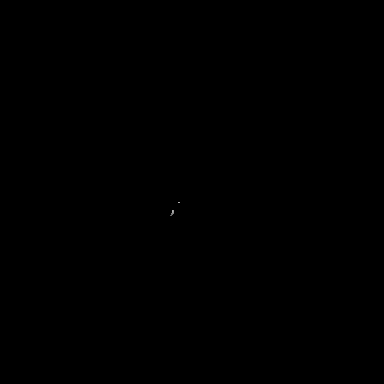

[Series 14: ax dwi_tracew · axial · 3.0mm · 1.31mm/px · z∈[-119,+35]mm · 3 of 48 slices shown (2 of 2)]
[im 1/48]
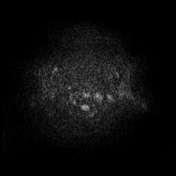
[im 24/48]
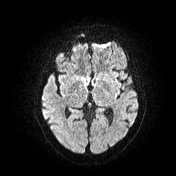
[im 48/48]
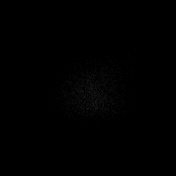

[Series 15: ax dwi_adc · axial · 3.0mm · 1.31mm/px · z∈[-119,+35]mm · 3 of 47 slices shown (2 of 2)]
[im 1/47]
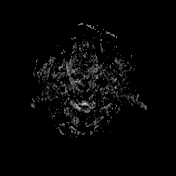
[im 24/47]
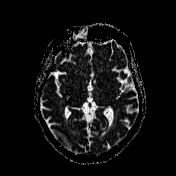
[im 47/47]
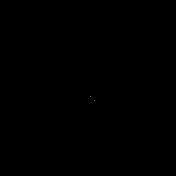

[Series 16: T1 · sagittal · 5.0mm · 0.62mm/px · 2 of 25 slices shown (1 of 2)]
[im 1/25]
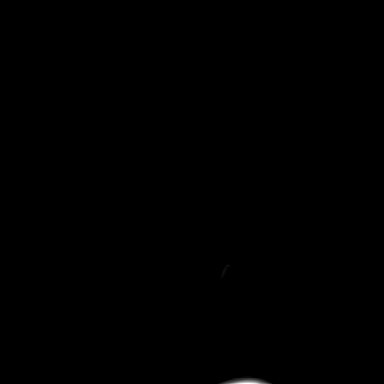
[im 25/25]
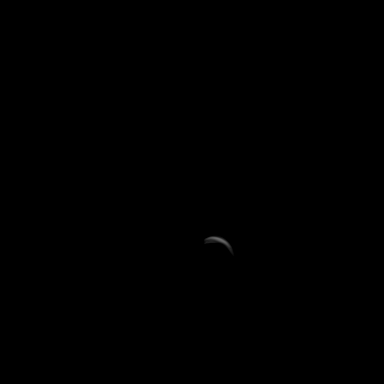

[Series 17: T2 · axial · 5.0mm · 0.53mm/px · z∈[-114,+30]mm · 2 of 25 slices shown (1 of 2)]
[im 1/25]
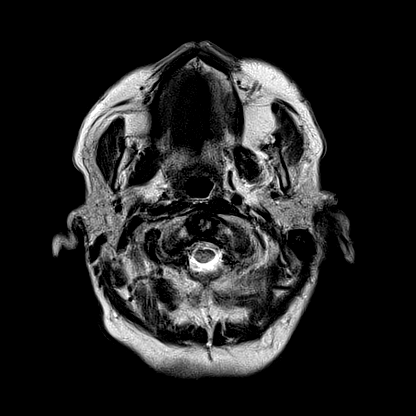
[im 25/25]
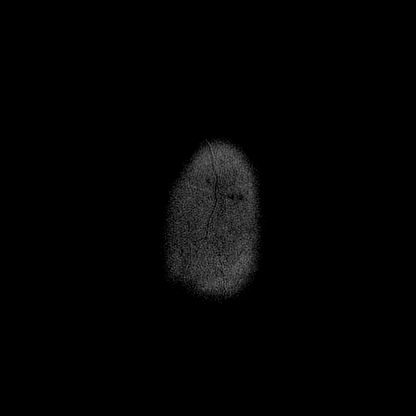

[Series 19: pha_images · axial · 3.0mm · 0.90mm/px · z∈[-130,+44]mm · 4 of 58 slices shown]
[im 1/58]
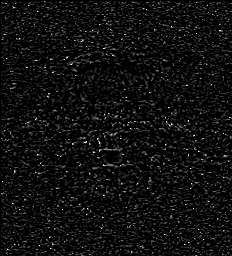
[im 20/58]
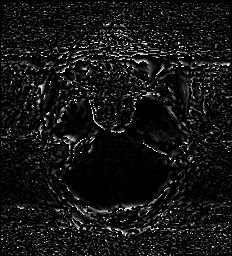
[im 39/58]
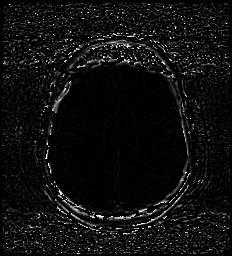
[im 58/58]
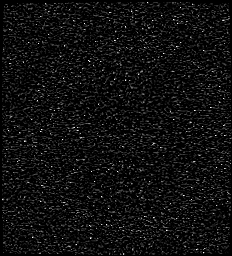

[Series 22: FLAIR · axial · 3.0mm · 0.53mm/px · z∈[-123,+39]mm · 4 of 55 slices shown]
[im 1/55]
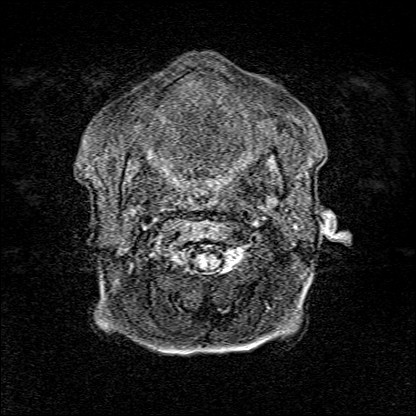
[im 19/55]
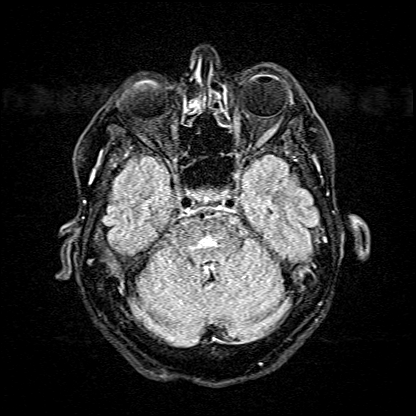
[im 37/55]
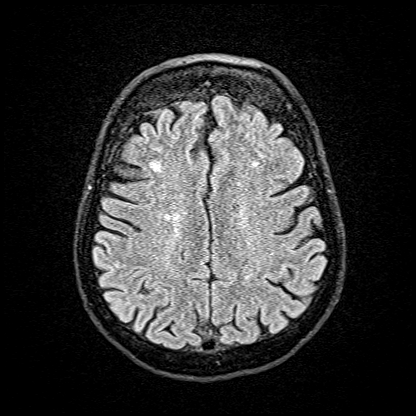
[im 55/55]
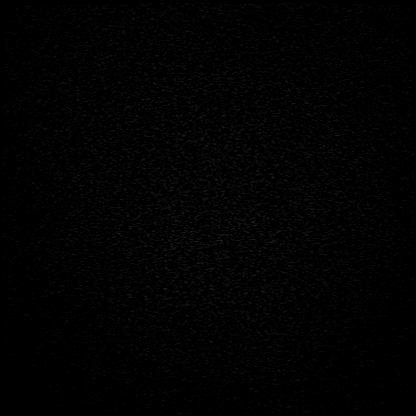

[Series 23: T1 · axial · 1.0mm · 0.98mm/px · z∈[-129,+46]mm · 8 of 176 slices shown (2 of 2)]
[im 1/176]
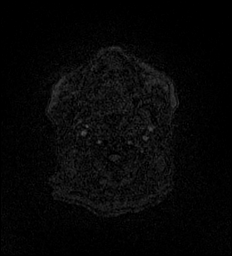
[im 32/176]
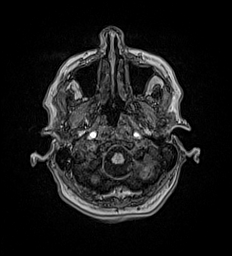
[im 48/176]
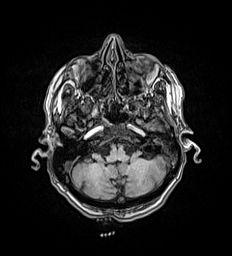
[im 80/176]
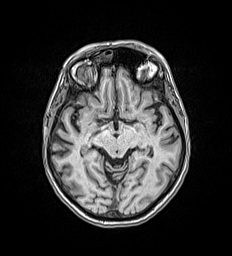
[im 96/176]
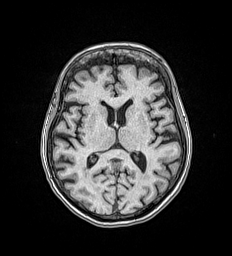
[im 128/176]
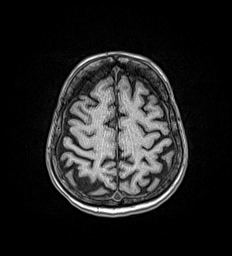
[im 144/176]
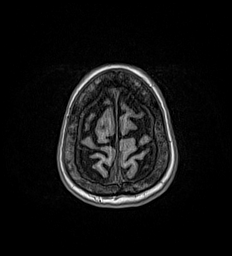
[im 176/176]
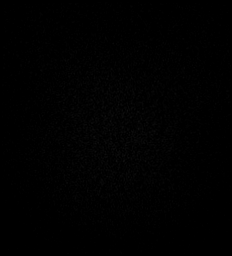

[Series 24: T2 · coronal · 5.0mm · 0.57mm/px · 2 of 29 slices shown (2 of 2)]
[im 1/29]
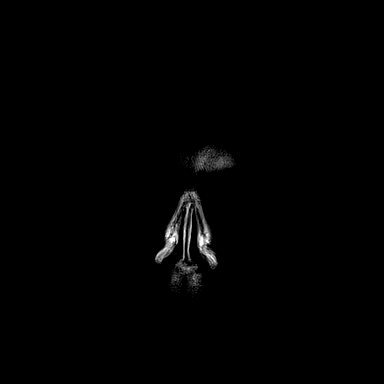
[im 29/29]
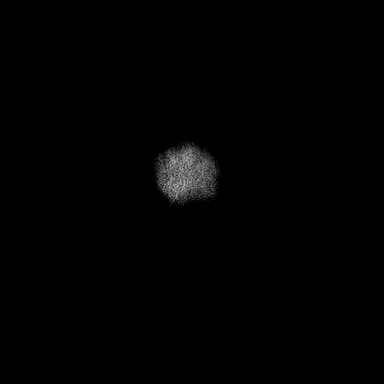

[40 of 48 positions shown; findings below may reference images not displayed]

FINDINGS: MRI HEAD FINDINGS

Brain: Mild diffuse prominence of the CSF containing spaces
compatible generalized age-related cerebral atrophy. Patchy and
confluent T2/FLAIR hyperintensity within the periventricular and
deep white matter of both cerebral hemispheres most consistent with
chronic small vessel ischemic disease, mild to moderate in nature.
Patchy involvement of the central pons with probable superimposed
remote lacunar infarct. Subcentimeter focus of FLAIR hyperintensity
involving the left occipital cortex likely reflects a small remote
cortical infarct (series 22, image 26). Tiny remote right cerebellar
infarct noted as well (series 17, image 8).

No abnormal foci of restricted diffusion to suggest acute or
subacute ischemia. Gray-white matter differentiation maintained. No
other areas of remote cortical infarction. No foci of susceptibility
artifact to suggest acute or chronic intracranial hemorrhage.

No mass lesion, midline shift or mass effect. No hydrocephalus or
extra-axial fluid collection. Incidental note made of a partially
empty sella. Midline structures intact.

Vascular: Major intracranial vascular flow voids are maintained.

Skull and upper cervical spine: Craniocervical junction within
normal limits. Bone marrow signal intensity within normal limits.
Hyperostosis frontalis interna noted. No scalp soft tissue
abnormality.

Sinuses/Orbits: Globes and orbital soft tissues demonstrate no acute
finding. Chronic right frontoethmoidal sinusitis, with additional
mild scattered mucosal thickening within the ethmoidal air cells and
maxillary sinuses. Superimposed small left maxillary sinus retention
cyst noted. Left mastoid and middle ear effusion noted, of uncertain
significance. Inner ear structures grossly within normal limits. No
abnormality about the visualized nasopharynx.

Other: None.

MRA HEAD FINDINGS

ANTERIOR CIRCULATION:

Visualized distal cervical segments of the internal carotid arteries
are widely patent with antegrade flow. Petrous, cavernous, and
supraclinoid ICAs widely patent without stenosis or other
abnormality. A1 segments widely patent. Normal anterior
communicating artery complex. Anterior cerebral arteries widely
patent their distal aspects without stenosis. No M1 stenosis or
occlusion. Negative MCA bifurcations. Distal MCA branches well
perfused and symmetric.

POSTERIOR CIRCULATION:

Both vertebral arteries widely patent to the vertebrobasilar
junction without stenosis. Both PICA origins patent and normal.
Basilar patent to its distal aspect without stenosis. Superior
cerebellar arteries are patent proximally. Left PCA supplied via the
basilar. Predominant fetal type origin of the right PCA. Both PCAs
well perfused to their distal aspects without stenosis.

No intracranial aneurysm.
IMPRESSION: MRI HEAD IMPRESSION:

1. No acute intracranial infarct or other abnormality.
2. Age-related cerebral atrophy with mild-to-moderate chronic small
vessel ischemic disease, with a few scattered small remote ischemic
infarcts as above.
3. Left mastoid and middle ear effusion, of uncertain significance.
Correlation with any symptomatology and physical exam recommended.
4. Chronic right frontoethmoidal sinusitis.

MRA HEAD IMPRESSION:

Normal intracranial MRA. No large vessel occlusion, hemodynamically
significant stenosis, or other acute vascular abnormality.

## 2022-08-21 IMAGING — US US CAROTID DUPLEX BILAT
1 series · 13 of 24 positions shown · non-contrast
Comparison: None

CLINICAL DATA: Stroke-like symptoms

EXAM:
BILATERAL CAROTID DUPLEX ULTRASOUND
TECHNIQUE: Gray scale imaging, color Doppler and duplex ultrasound were
performed of bilateral carotid and vertebral arteries in the neck.

[Series 1: us carotid duplex bilat · 0.06mm/px · 13 of 63 slices shown]
[im 1/63]
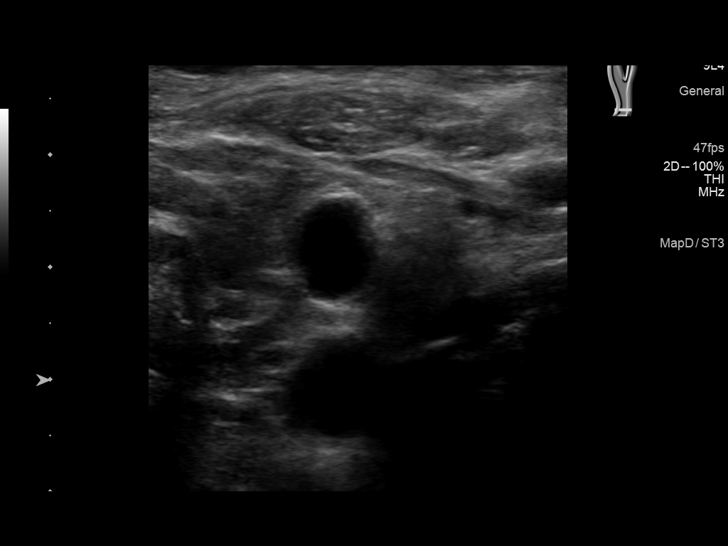
[im 6/63]
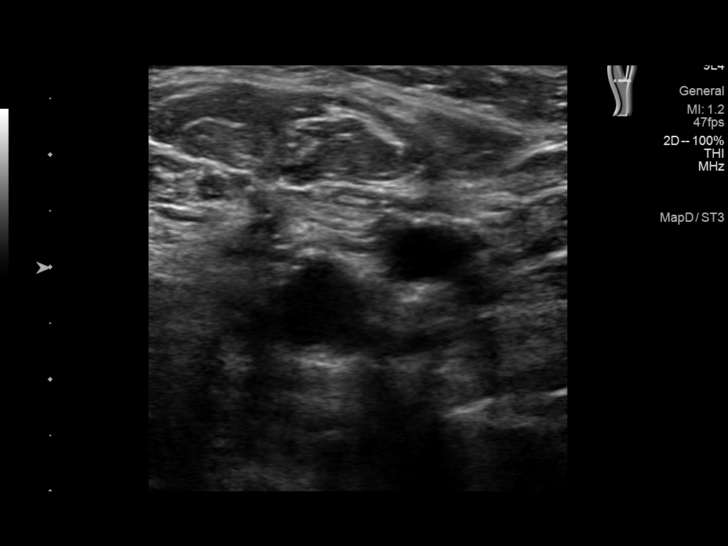
[im 11/63]
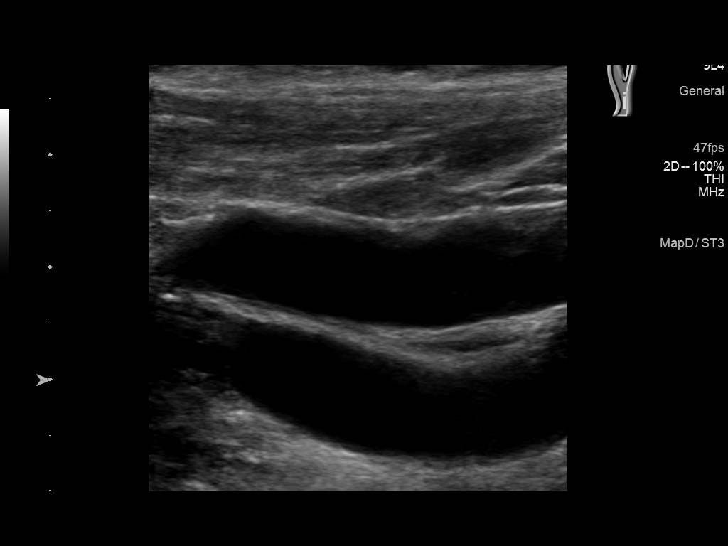
[im 17/63]
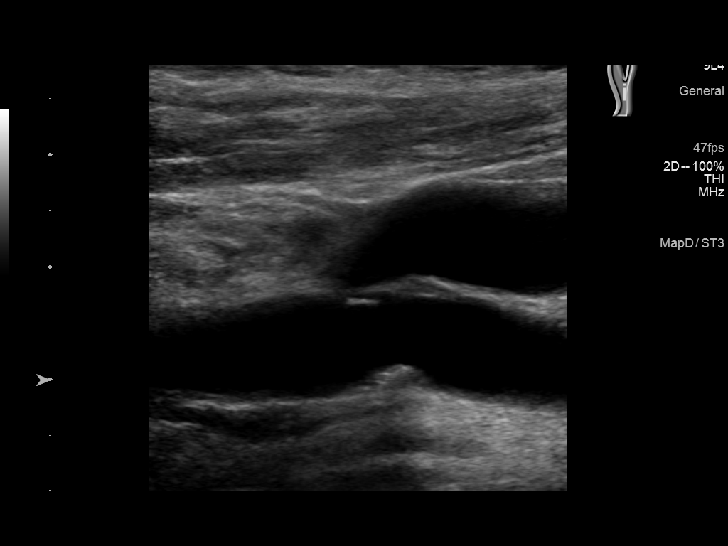
[im 22/63]
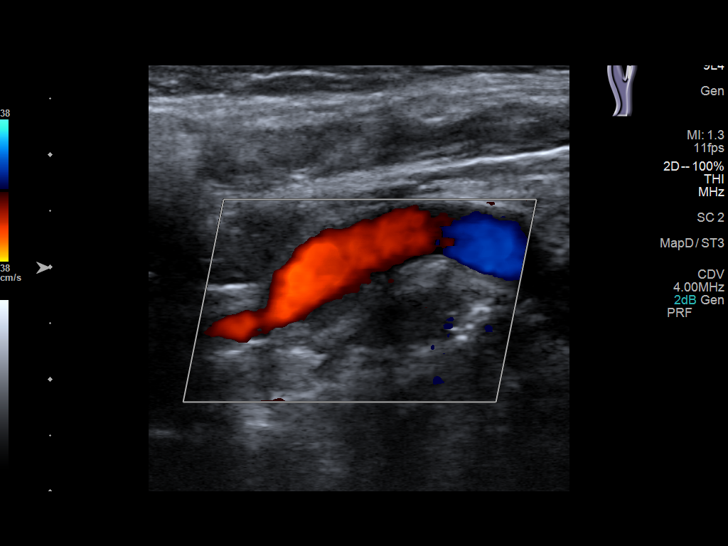
[im 27/63]
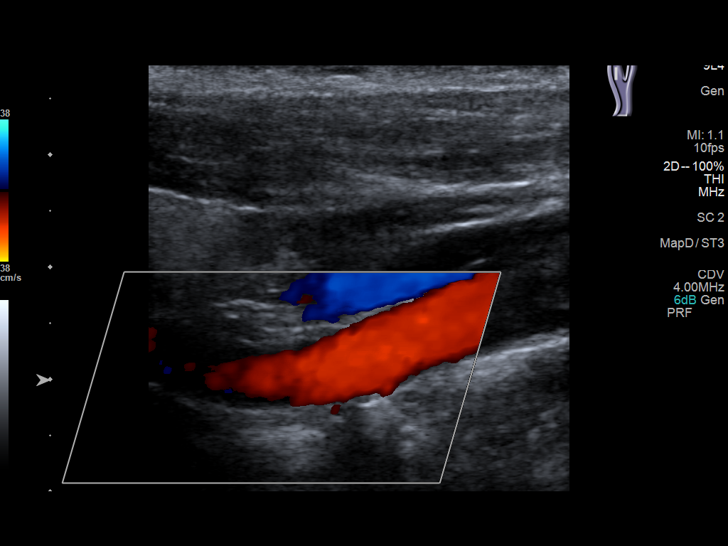
[im 33/63]
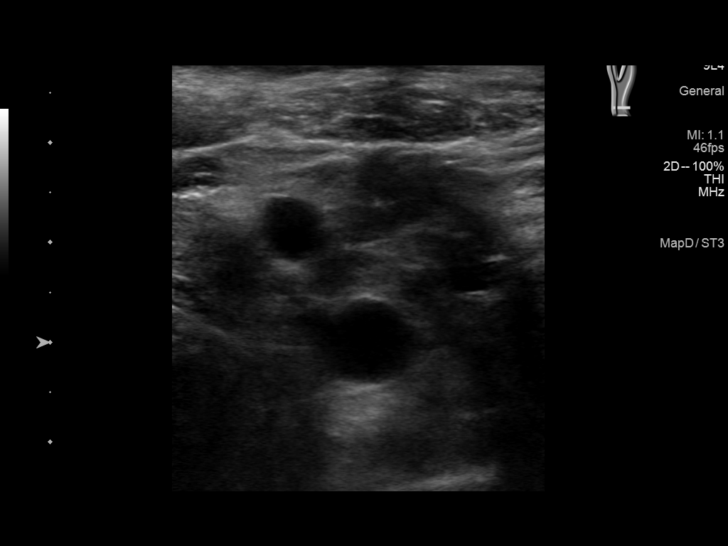
[im 36/63]
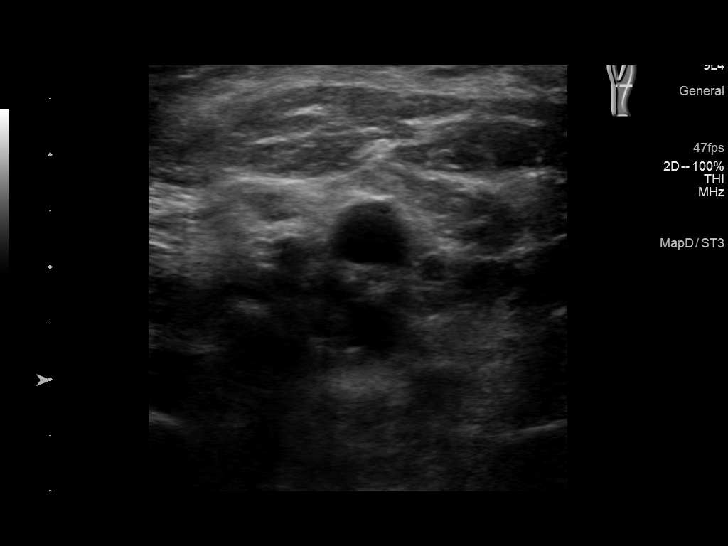
[im 41/63]
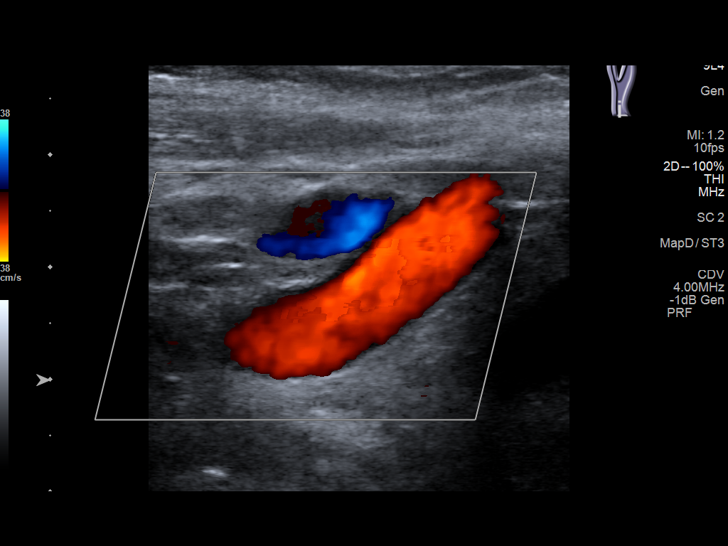
[im 46/63]
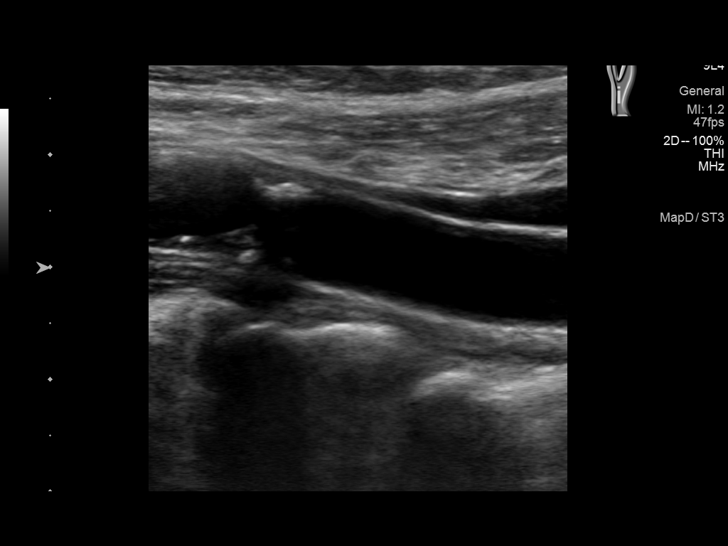
[im 52/63]
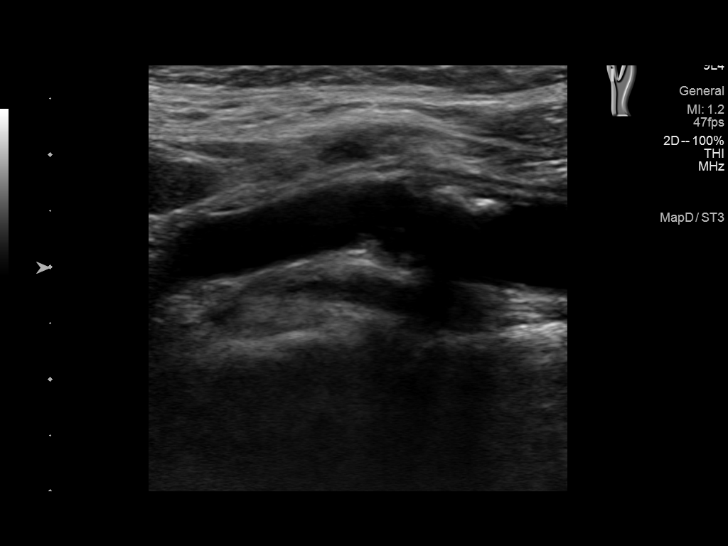
[im 57/63]
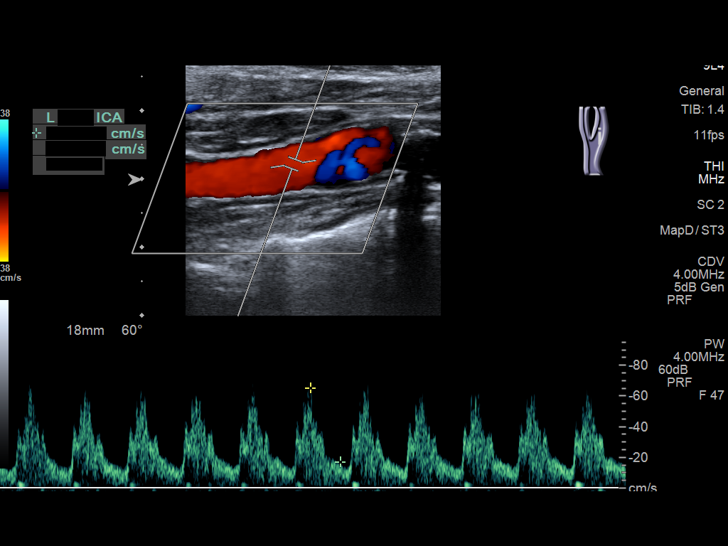
[im 63/63]
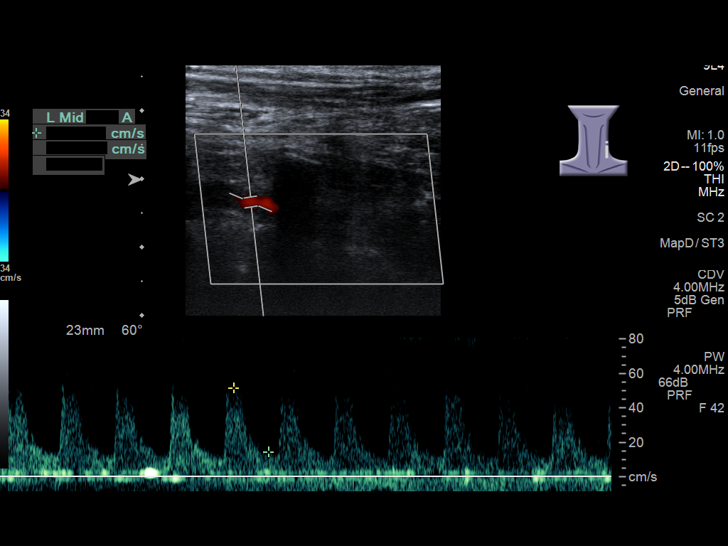

[13 of 24 positions shown; findings below may reference images not displayed]

FINDINGS: Criteria: Quantification of carotid stenosis is based on velocity
parameters that correlate the residual internal carotid diameter
with NASCET-based stenosis levels, using the diameter of the distal
internal carotid lumen as the denominator for stenosis measurement.

The following velocity measurements were obtained:

RIGHT

ICA: 49/12 cm/sec

CCA: 79/14 cm/sec

SYSTOLIC ICA/CCA RATIO:

ECA: 56 cm/sec

LEFT

ICA: 68/23 cm/sec

CCA: 55/17 cm/sec

SYSTOLIC ICA/CCA RATIO:

ECA: 104 cm/sec

RIGHT CAROTID ARTERY: Preliminary grayscale images demonstrates
tortuosity of the carotid artery. Minimal atherosclerotic plaque is
noted. Evaluation of the waveforms, velocities and flow velocity
ratios however demonstrate no evidence of focal hemodynamically
significant stenosis.

RIGHT VERTEBRAL ARTERY:  Antegrade in nature.

LEFT CAROTID ARTERY: Preliminary grayscale images demonstrate mild
atherosclerotic plaque. Waveforms, velocities and flow velocity
ratios however demonstrate no evidence of focal hemodynamically
significant stenosis.

LEFT VERTEBRAL ARTERY:  Antegrade in nature.
IMPRESSION: Minimal atherosclerotic plaque bilaterally. No focal hemodynamically
significant carotid stenosis is seen.

## 2022-08-21 IMAGING — DX DG CHEST 1V PORT
1 series · 1 of 1 positions shown · non-contrast
Comparison: 08/27/2020

CLINICAL DATA: Possible stroke

EXAM:
PORTABLE CHEST 1 VIEW

[chest ap]
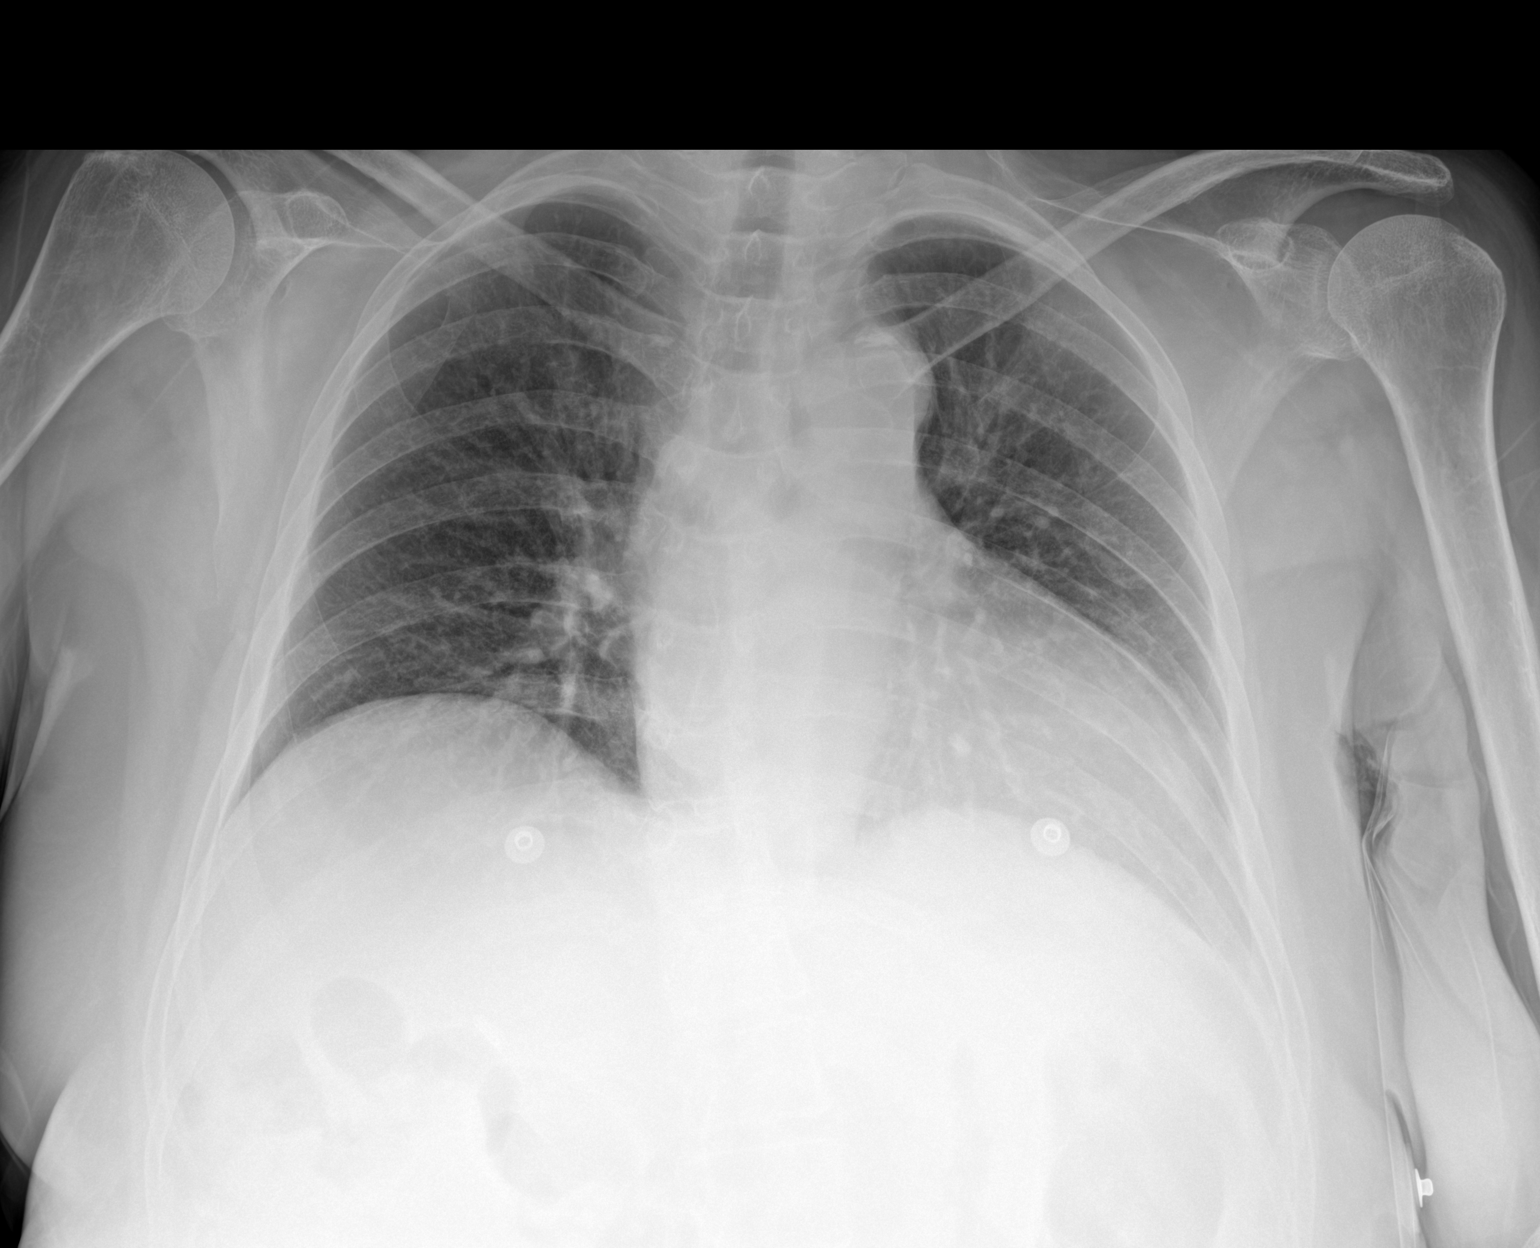

[1 of 1 positions shown; findings below may reference images not displayed]

FINDINGS: Mildly diminished lung volumes. Borderline to mild cardiomegaly. No
focal opacity, pleural effusion or pneumothorax
IMPRESSION: No active disease. Borderline to mild cardiomegaly.

## 2022-08-21 IMAGING — CT CT HEAD W/O CM
3 series · 15 of 47 positions shown, 18 images · non-contrast
Comparison: None.

CLINICAL DATA: Fall with head and neck trauma.  Left-sided weakness

EXAM:
CT HEAD WITHOUT CONTRAST
CT CERVICAL SPINE WITHOUT CONTRAST
TECHNIQUE: Multidetector CT imaging of the head and cervical spine was
performed following the standard protocol without intravenous
contrast. Multiplanar CT image reconstructions of the cervical spine
were also generated.

[Series 3: head wo · axial · 0.39mm/px · z∈[+892,+1017]mm · 9 of 30 slices shown, 12 images]
[im 3/30  brain]
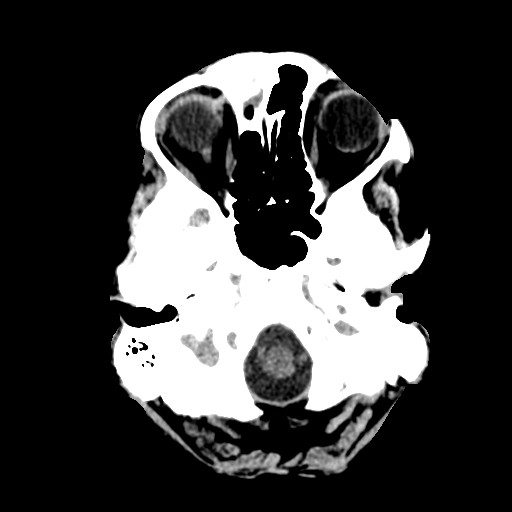
[im 3/30  bone]
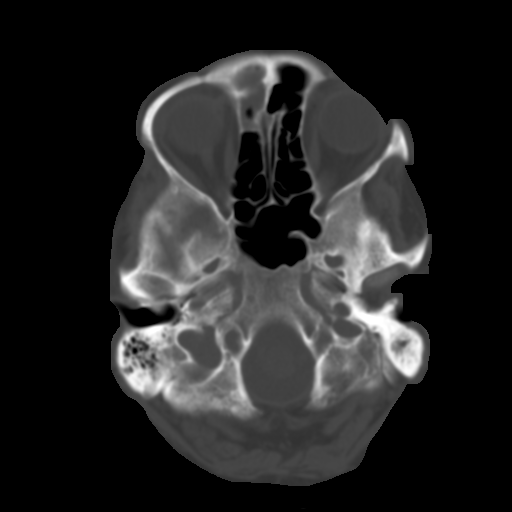
[im 6/30  brain]
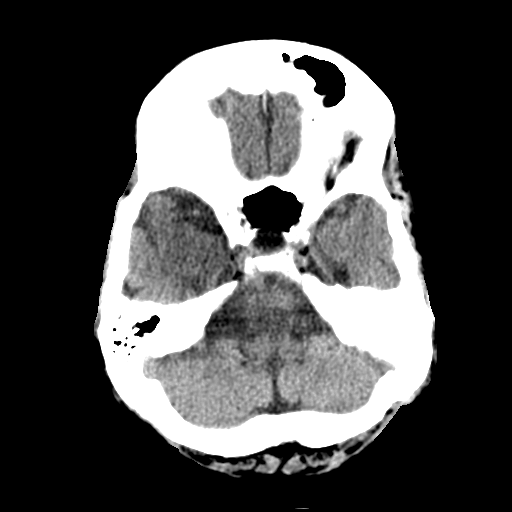
[im 9/30  brain]
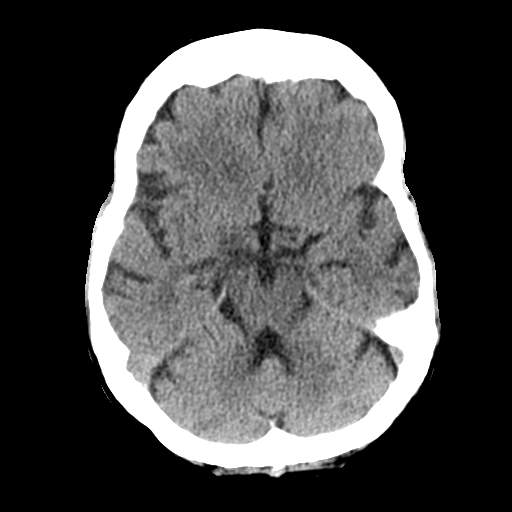
[im 12/30  brain]
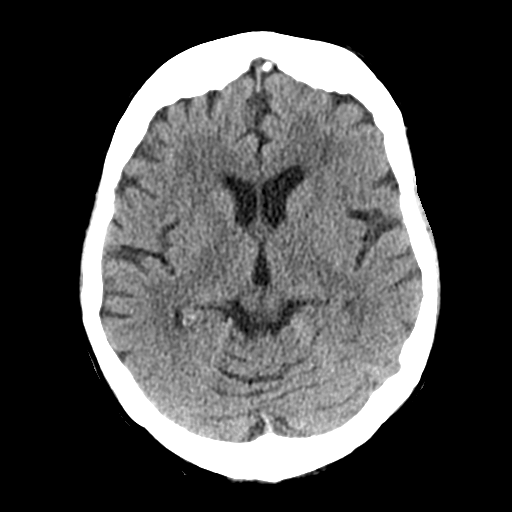
[im 16/30  brain]
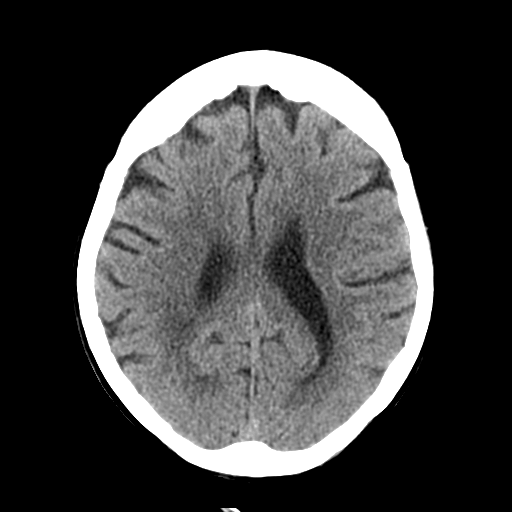
[im 16/30  bone]
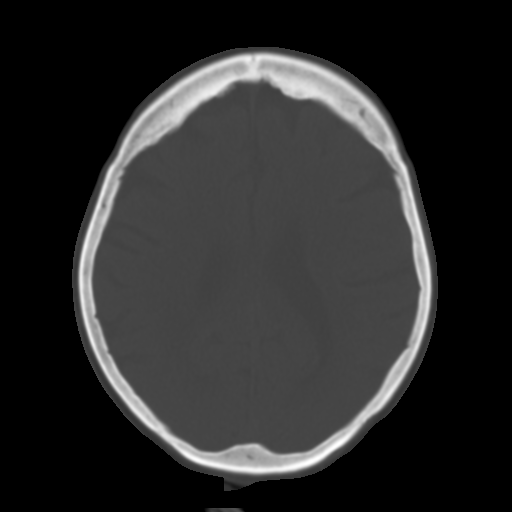
[im 19/30  brain]
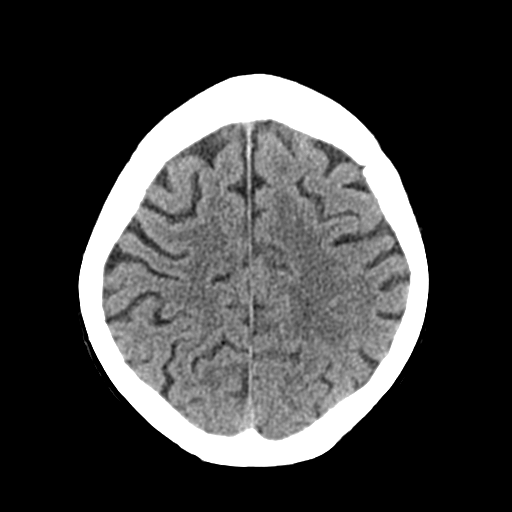
[im 22/30  brain]
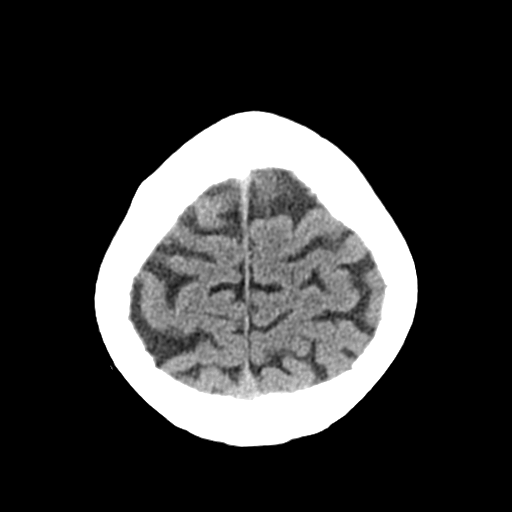
[im 25/30  brain]
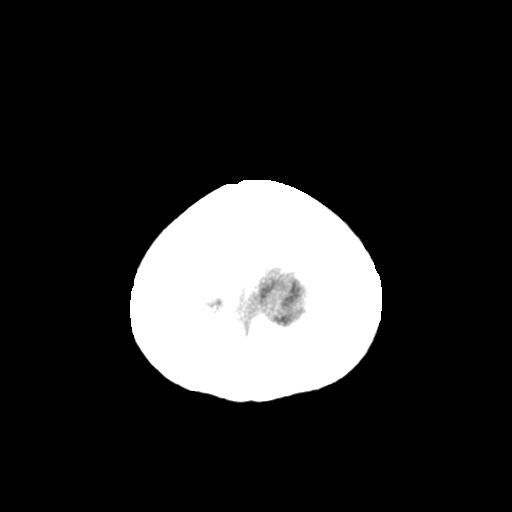
[im 28/30  brain]
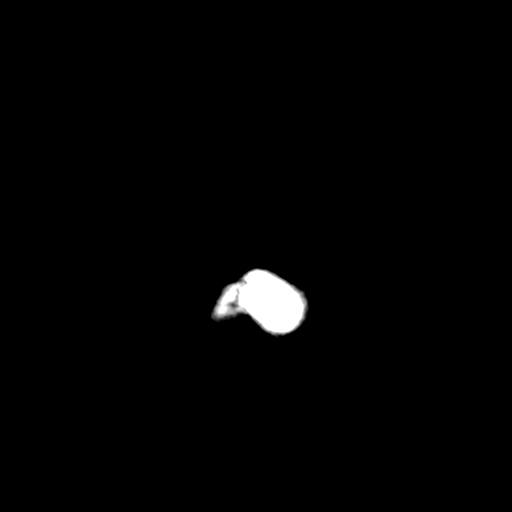
[im 28/30  bone]
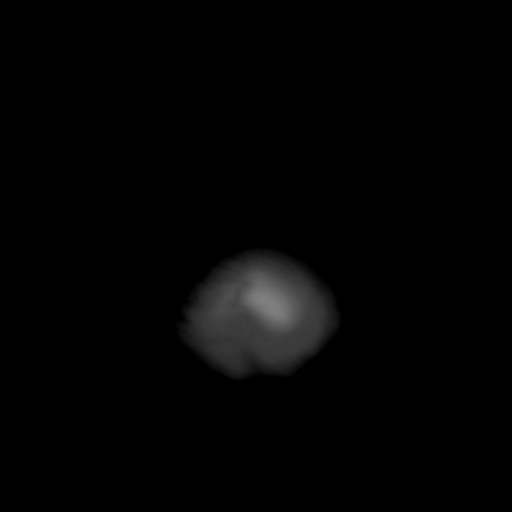

[Series 4: coronal soft tissue · coronal · 0.29mm/px · 3 of 64 slices shown]
[im 22/64  brain]
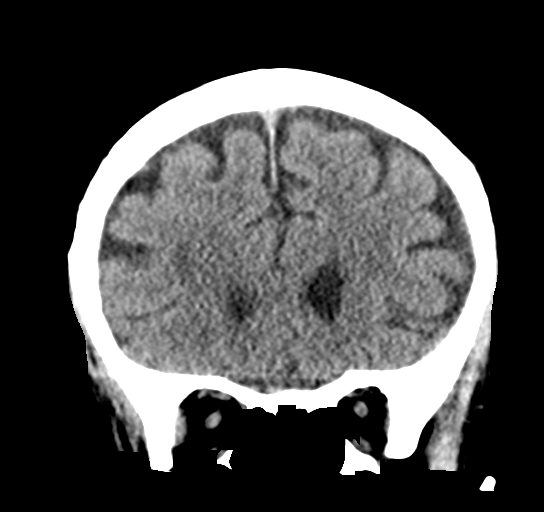
[im 29/64  brain]
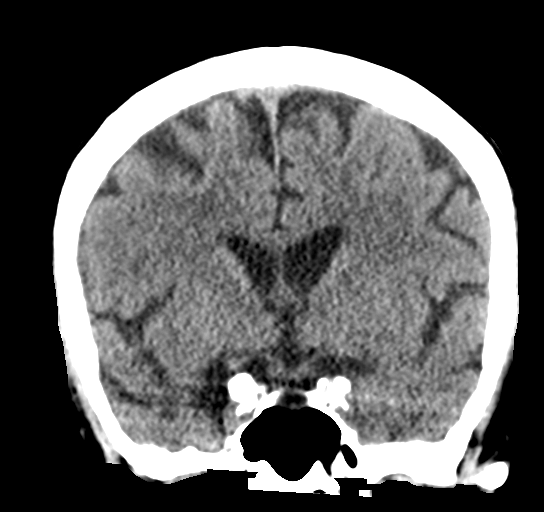
[im 36/64  brain]
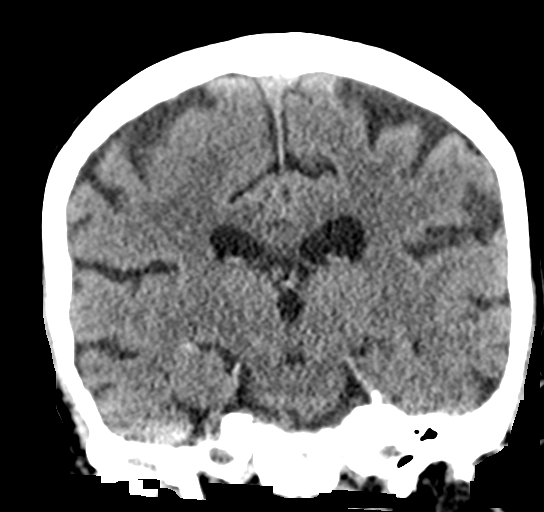

[Series 5: sagittal soft tissue · sagittal · 0.29mm/px · 3 of 50 slices shown]
[im 17/50  brain]
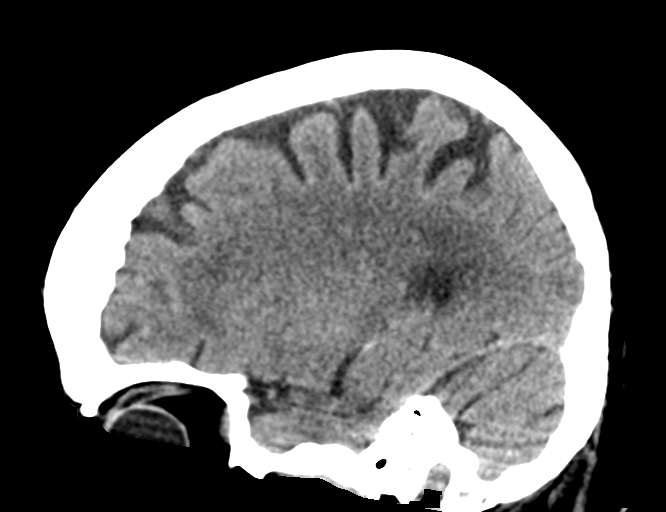
[im 25/50  brain]
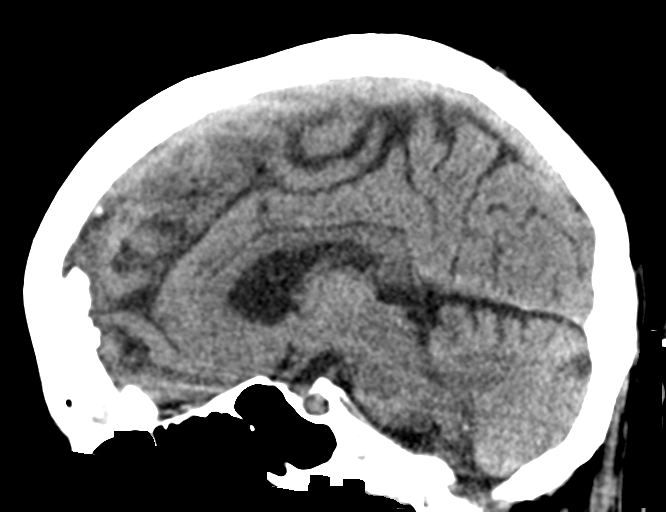
[im 33/50  brain]
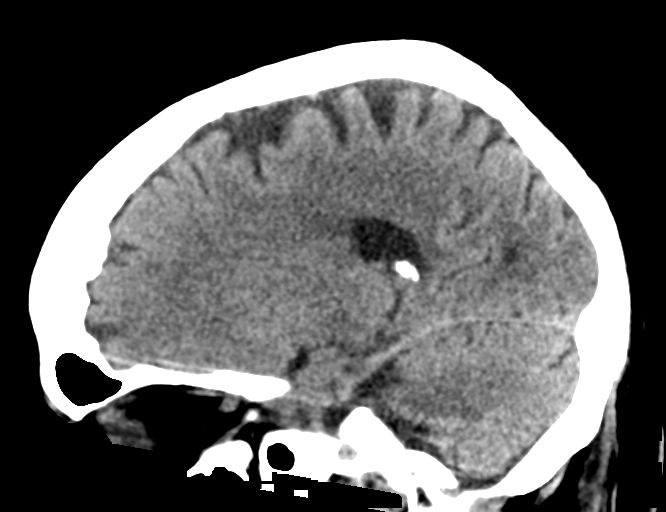

[15 of 47 positions shown; findings below may reference images not displayed]

FINDINGS: CT HEAD FINDINGS

Brain: No evidence of acute infarction, hemorrhage, hydrocephalus,
extra-axial collection or mass lesion/mass effect. Scattered
low-density changes within the periventricular and subcortical white
matter compatible with chronic microvascular ischemic change. Mild
diffuse cerebral volume loss.

Vascular: Atherosclerotic calcifications involving the large vessels
of the skull base. No unexpected hyperdense vessel.

Skull: Normal. Negative for fracture or focal lesion.

Sinuses/Orbits: New near complete opacification of the right frontal
sinus. Chronic partial opacification of the left mastoid air cells.
Visualized orbital structures appear within normal limits.

Other: None.

CT CERVICAL SPINE FINDINGS

Alignment: Facet joints are aligned without dislocation or traumatic
listhesis. Dens and lateral masses are aligned. Straightening of the
cervical lordosis.

Skull base and vertebrae: No acute fracture. No primary bone lesion
or focal pathologic process.

Soft tissues and spinal canal: No prevertebral fluid or swelling. No
visible canal hematoma.

Disc levels: Mild degenerative disc disease of C5-6. Findings
similar to prior.

Upper chest: Included lung apices are clear.

Other: None.
IMPRESSION: 1. No acute intracranial findings.
2. No evidence of acute fracture or traumatic listhesis of the
cervical spine.
3. New right frontal sinus opacification.  Correlate for sinusitis.
4. Chronic nonspecific partial opacification of the left mastoid air
cells.

## 2022-08-21 IMAGING — MR MR MRA HEAD W/O CM
1 series · 17 of 48 positions shown · non-contrast
Comparison: Prior CT from earlier the same day.

CLINICAL DATA: Initial evaluation for acute stroke.

EXAM:
MRI HEAD WITHOUT CONTRAST
MRA HEAD WITHOUT CONTRAST
TECHNIQUE: Multiplanar, multiecho pulse sequences of the brain and surrounding
structures were obtained without intravenous contrast. Angiographic
images of the head were obtained using MRA technique without
contrast.

[Series 9: TOF · axial · 0.5mm · 0.41mm/px · z∈[-112,-15]mm · 17 of 205 slices shown]
[im 1/205]
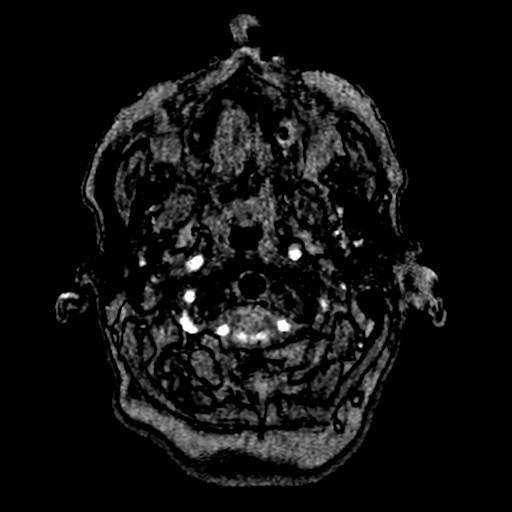
[im 5/205]
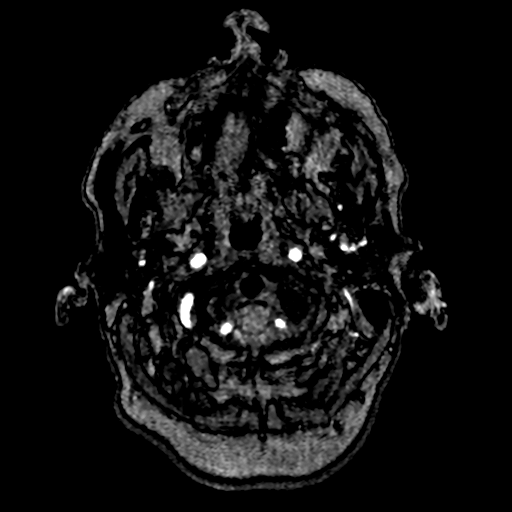
[im 9/205]
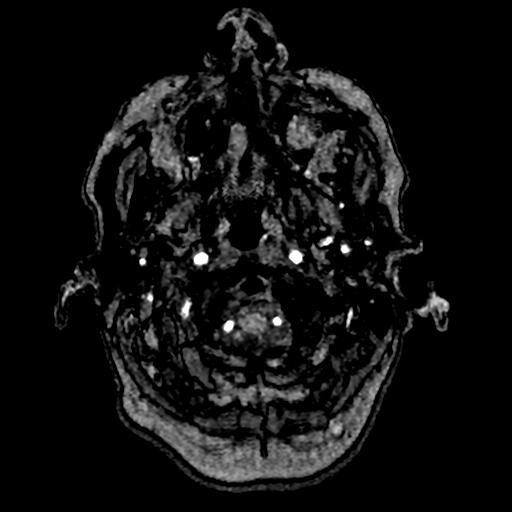
[im 14/205]
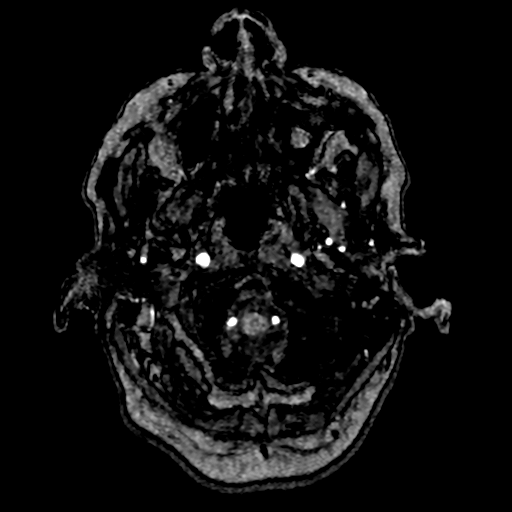
[im 18/205]
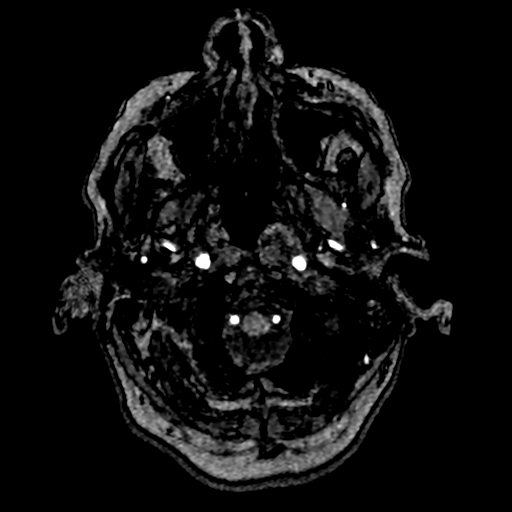
[im 22/205]
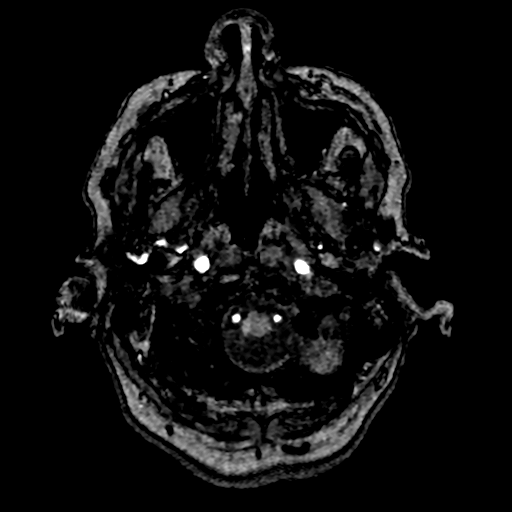
[im 27/205]
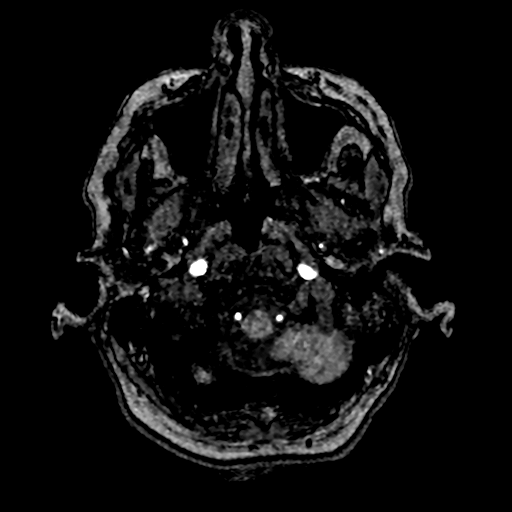
[im 35/205]
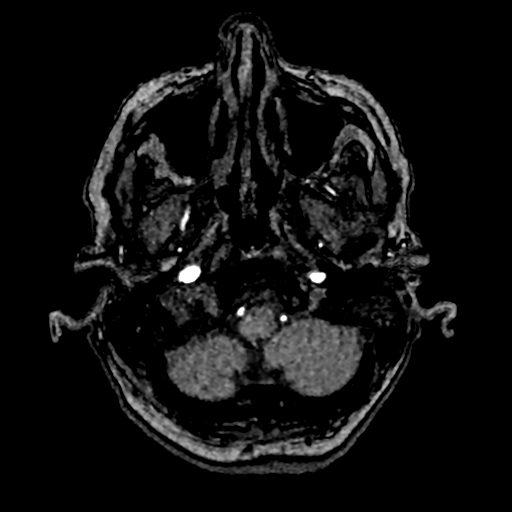
[im 40/205]
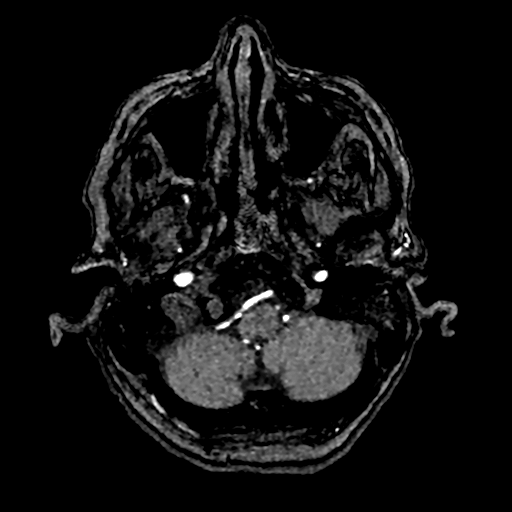
[im 66/205]
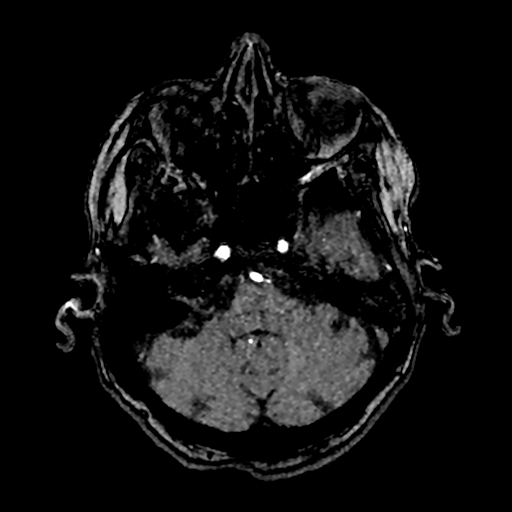
[im 92/205]
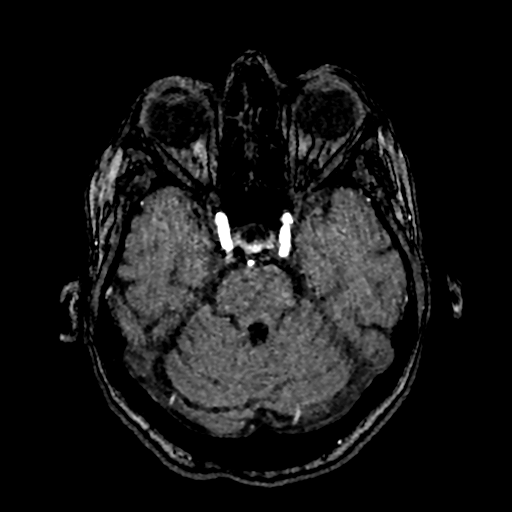
[im 105/205]
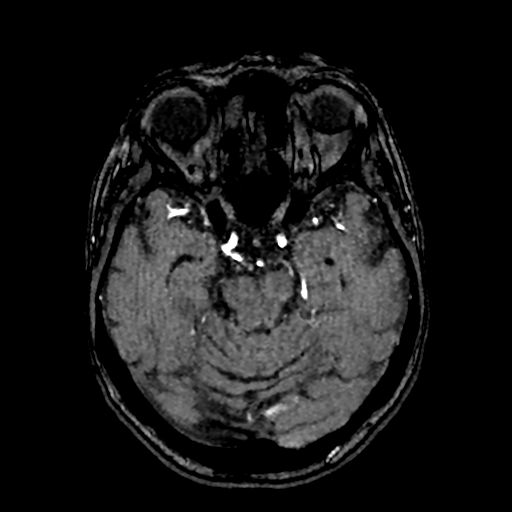
[im 118/205]
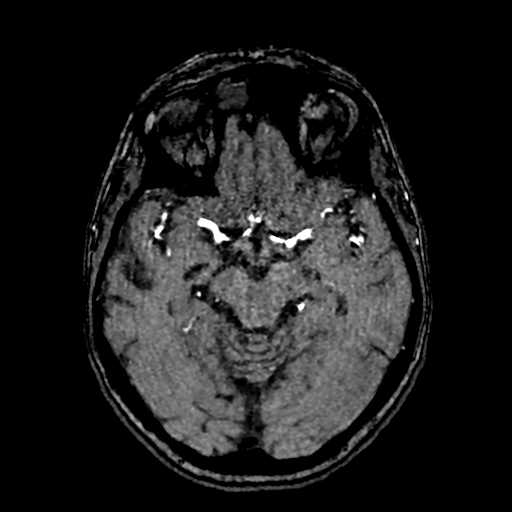
[im 144/205]
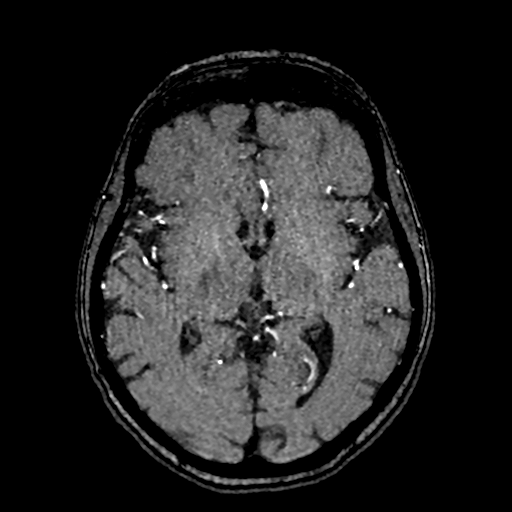
[im 170/205]
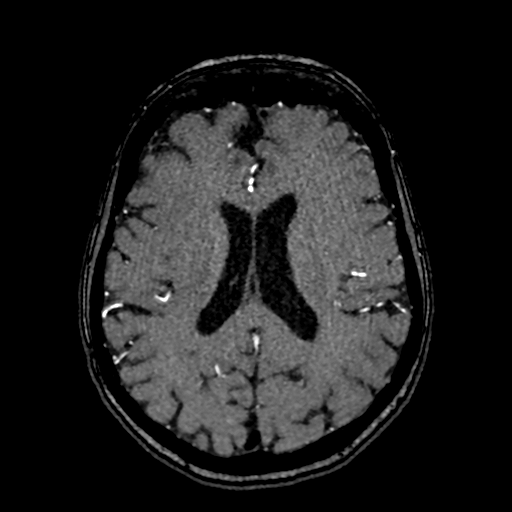
[im 174/205]
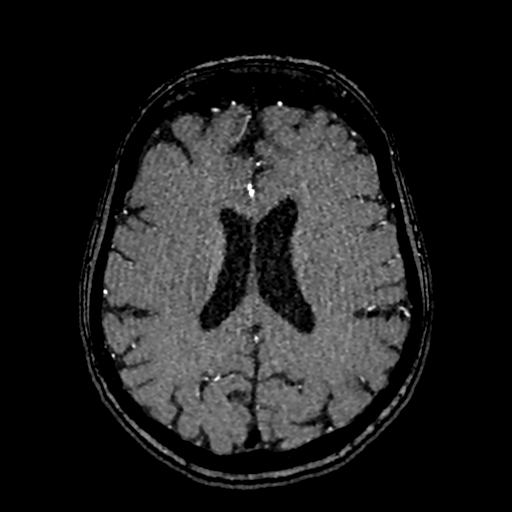
[im 196/205]
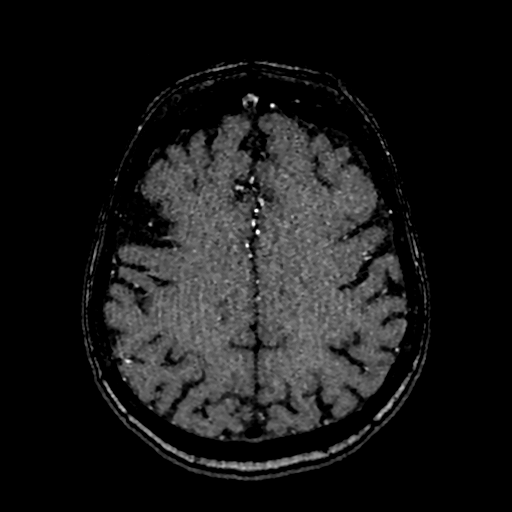

[17 of 48 positions shown; findings below may reference images not displayed]

FINDINGS: MRI HEAD FINDINGS

Brain: Mild diffuse prominence of the CSF containing spaces
compatible generalized age-related cerebral atrophy. Patchy and
confluent T2/FLAIR hyperintensity within the periventricular and
deep white matter of both cerebral hemispheres most consistent with
chronic small vessel ischemic disease, mild to moderate in nature.
Patchy involvement of the central pons with probable superimposed
remote lacunar infarct. Subcentimeter focus of FLAIR hyperintensity
involving the left occipital cortex likely reflects a small remote
cortical infarct (series 22, image 26). Tiny remote right cerebellar
infarct noted as well (series 17, image 8).

No abnormal foci of restricted diffusion to suggest acute or
subacute ischemia. Gray-white matter differentiation maintained. No
other areas of remote cortical infarction. No foci of susceptibility
artifact to suggest acute or chronic intracranial hemorrhage.

No mass lesion, midline shift or mass effect. No hydrocephalus or
extra-axial fluid collection. Incidental note made of a partially
empty sella. Midline structures intact.

Vascular: Major intracranial vascular flow voids are maintained.

Skull and upper cervical spine: Craniocervical junction within
normal limits. Bone marrow signal intensity within normal limits.
Hyperostosis frontalis interna noted. No scalp soft tissue
abnormality.

Sinuses/Orbits: Globes and orbital soft tissues demonstrate no acute
finding. Chronic right frontoethmoidal sinusitis, with additional
mild scattered mucosal thickening within the ethmoidal air cells and
maxillary sinuses. Superimposed small left maxillary sinus retention
cyst noted. Left mastoid and middle ear effusion noted, of uncertain
significance. Inner ear structures grossly within normal limits. No
abnormality about the visualized nasopharynx.

Other: None.

MRA HEAD FINDINGS

ANTERIOR CIRCULATION:

Visualized distal cervical segments of the internal carotid arteries
are widely patent with antegrade flow. Petrous, cavernous, and
supraclinoid ICAs widely patent without stenosis or other
abnormality. A1 segments widely patent. Normal anterior
communicating artery complex. Anterior cerebral arteries widely
patent their distal aspects without stenosis. No M1 stenosis or
occlusion. Negative MCA bifurcations. Distal MCA branches well
perfused and symmetric.

POSTERIOR CIRCULATION:

Both vertebral arteries widely patent to the vertebrobasilar
junction without stenosis. Both PICA origins patent and normal.
Basilar patent to its distal aspect without stenosis. Superior
cerebellar arteries are patent proximally. Left PCA supplied via the
basilar. Predominant fetal type origin of the right PCA. Both PCAs
well perfused to their distal aspects without stenosis.

No intracranial aneurysm.
IMPRESSION: MRI HEAD IMPRESSION:

1. No acute intracranial infarct or other abnormality.
2. Age-related cerebral atrophy with mild-to-moderate chronic small
vessel ischemic disease, with a few scattered small remote ischemic
infarcts as above.
3. Left mastoid and middle ear effusion, of uncertain significance.
Correlation with any symptomatology and physical exam recommended.
4. Chronic right frontoethmoidal sinusitis.

MRA HEAD IMPRESSION:

Normal intracranial MRA. No large vessel occlusion, hemodynamically
significant stenosis, or other acute vascular abnormality.

## 2022-08-31 DIAGNOSIS — E162 Hypoglycemia, unspecified: Secondary | ICD-10-CM | POA: Diagnosis not present

## 2022-08-31 DIAGNOSIS — I4891 Unspecified atrial fibrillation: Secondary | ICD-10-CM | POA: Diagnosis not present

## 2022-09-10 DEATH — deceased

## 2022-09-25 IMAGING — DX DG CHEST 2V
2 series · 2 of 2 positions shown · non-contrast
Comparison: None.

CLINICAL DATA: Cough, fever and hemoptysis. Patient diagnosed with
[id number] in mid September 2020

EXAM:
CHEST - 2 VIEW

[chest pa]
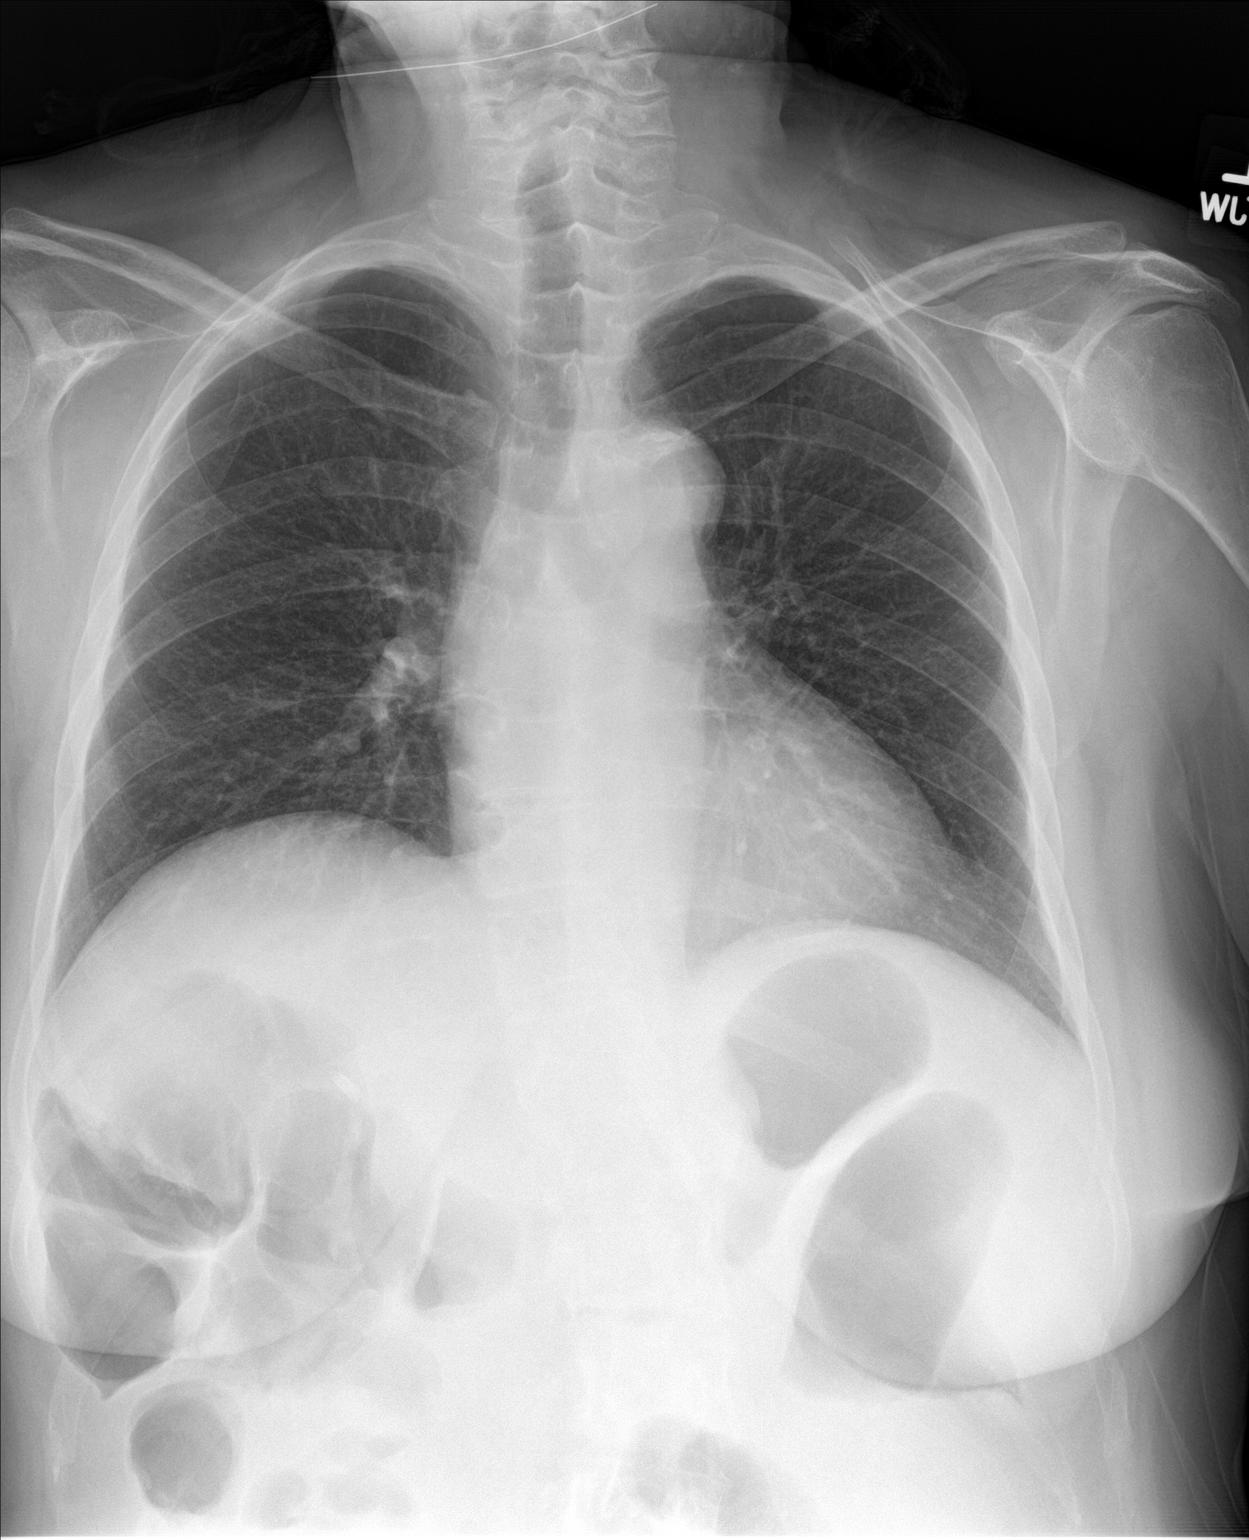

[chest lat]
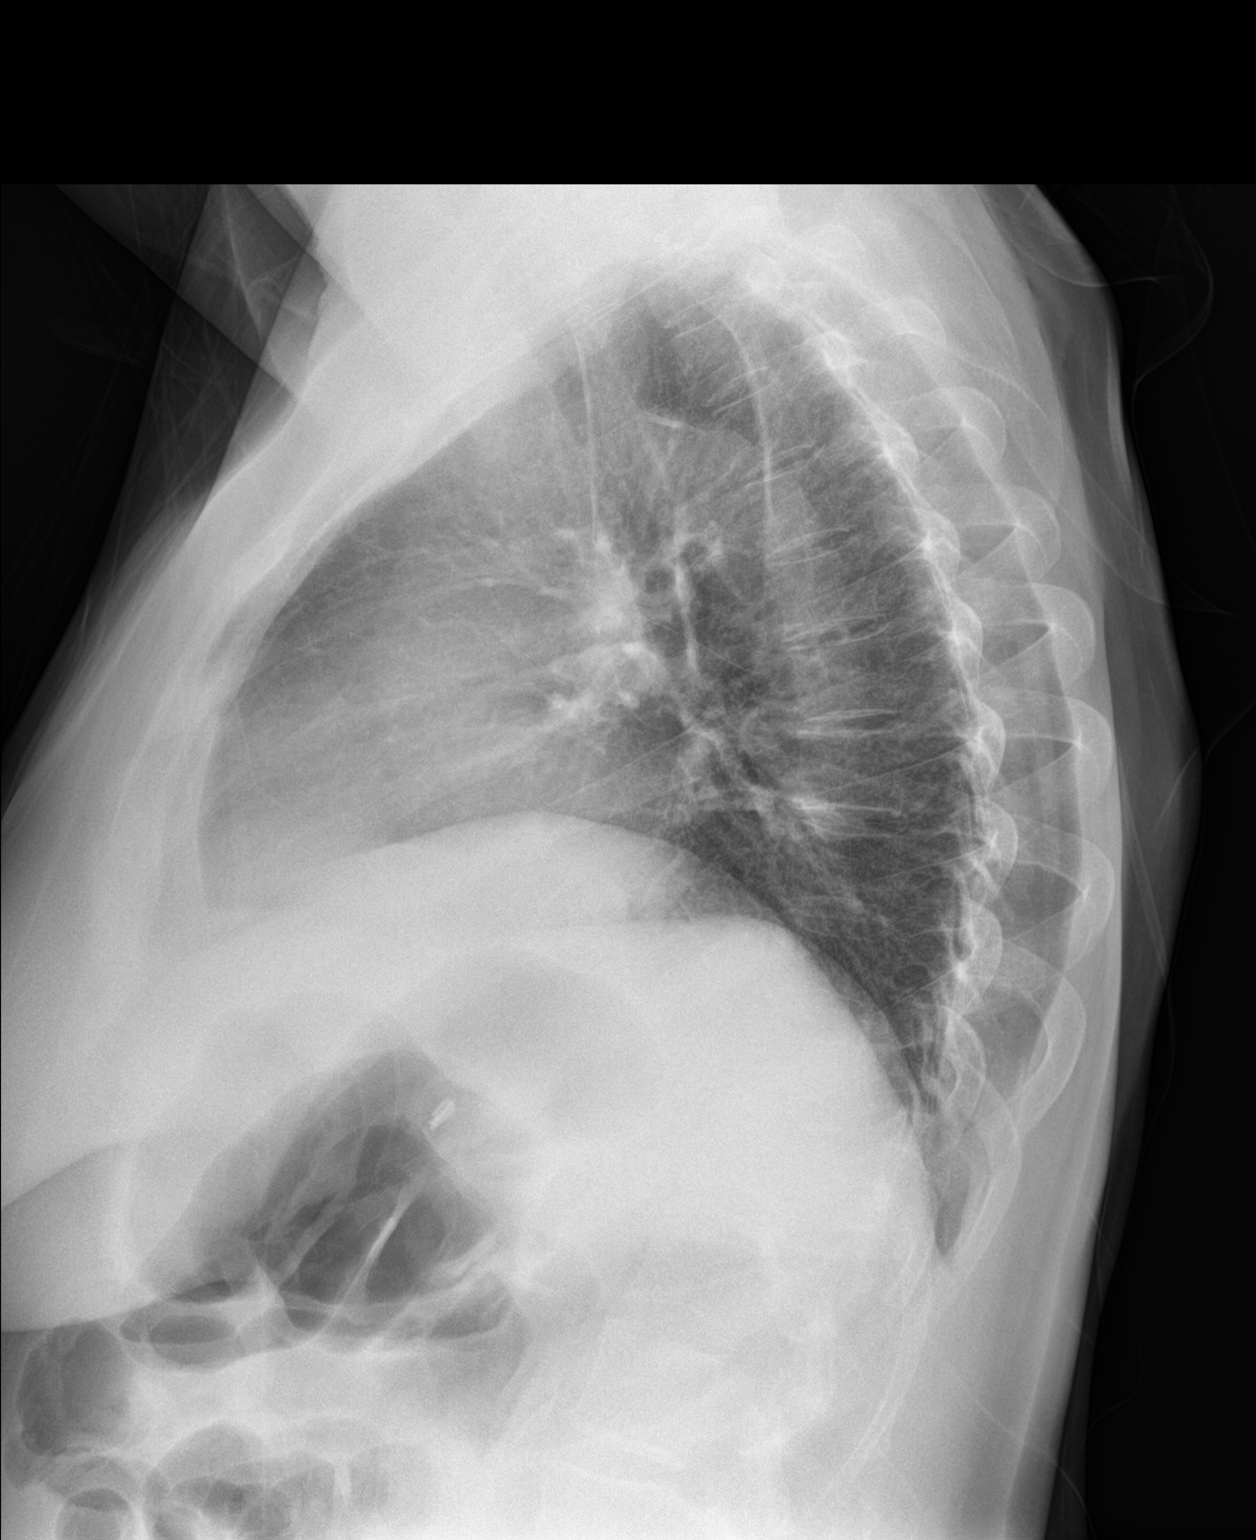

[2 of 2 positions shown; findings below may reference images not displayed]

FINDINGS: The lungs are clear. Heart size is normal. Aortic atherosclerosis.
No pneumothorax or pleural fluid. No acute or focal bony
abnormality.
IMPRESSION: No acute disease.

Aortic Atherosclerosis ([id number]-[id number].Y).

## 2022-10-03 IMAGING — CT CT ABD-PELV W/ CM
2 of 5 series · 15 of 46 positions shown, 17 images · IV contrast (APPLIED)
Comparison: December 05, 2019

CLINICAL DATA: Acute pain due to trauma. Right lower quadrant
abdominal pain and rib pain.

EXAM:
CT ABDOMEN AND PELVIS WITH CONTRAST
TECHNIQUE: Multidetector CT imaging of the abdomen and pelvis was performed
using the standard protocol following bolus administration of
intravenous contrast.
CONTRAST:  75mL OMNIPAQUE IOHEXOL 300 MG/ML  SOLN

[Series 2: routine abd/pel with · axial · 0.86mm/px · z∈[-742,-282]mm · 12 of 106 slices shown, 14 images]
[im 7/106  soft-tissue]
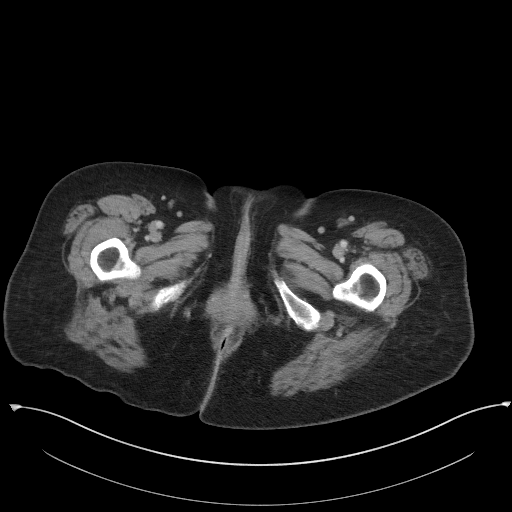
[im 7/106  bone]
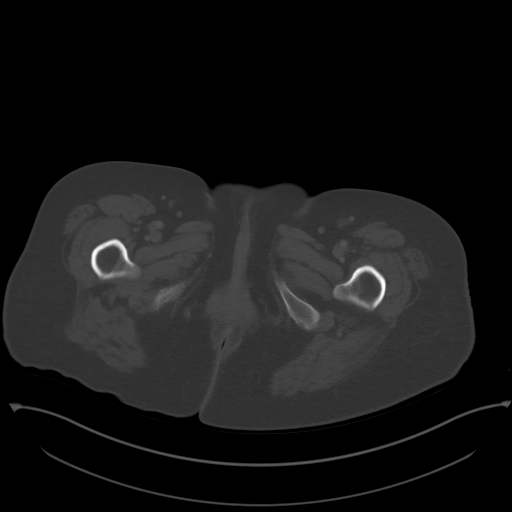
[im 19/106  soft-tissue]
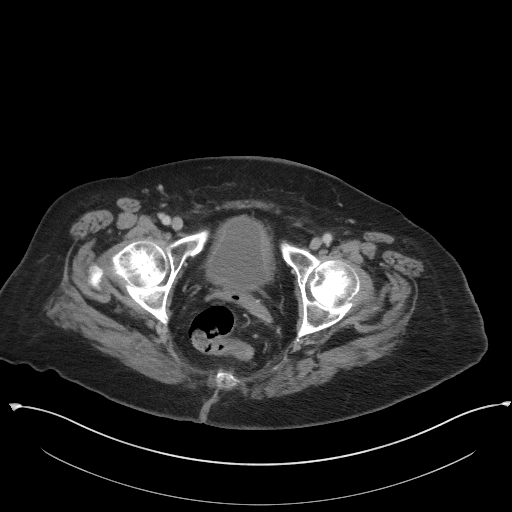
[im 25/106  soft-tissue]
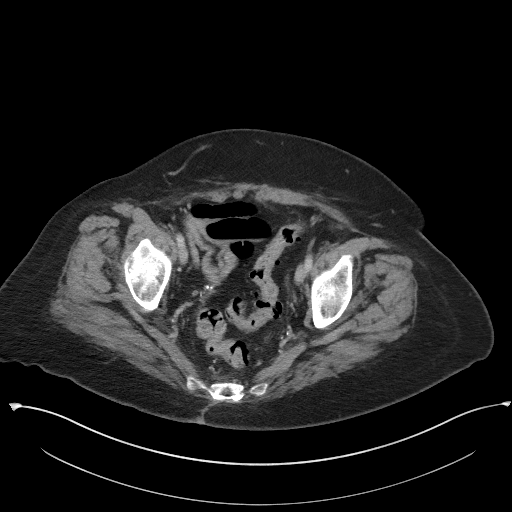
[im 31/106  soft-tissue]
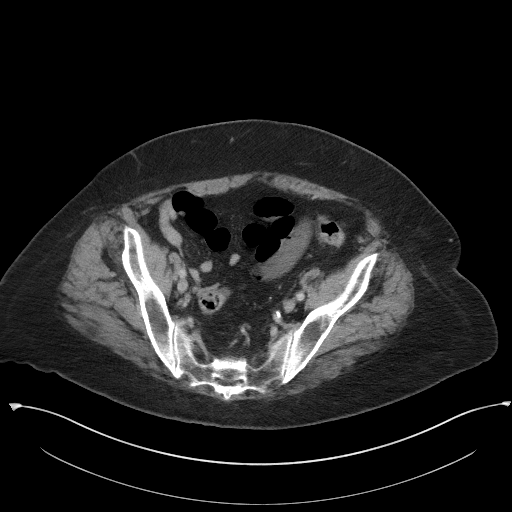
[im 44/106  soft-tissue]
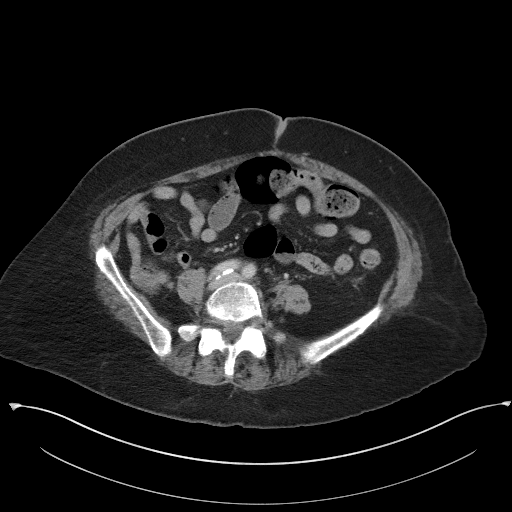
[im 50/106  soft-tissue]
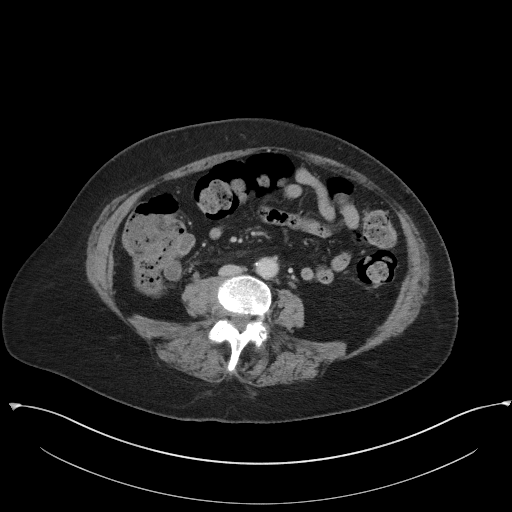
[im 56/106  soft-tissue]
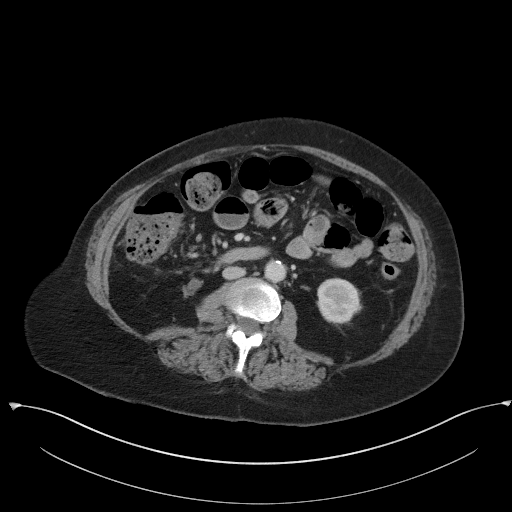
[im 68/106  soft-tissue]
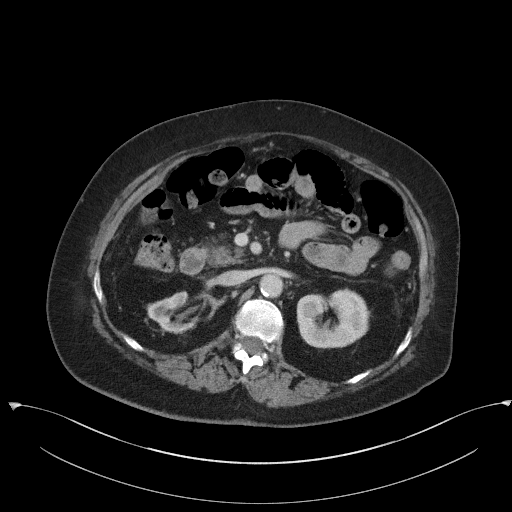
[im 75/106  soft-tissue]
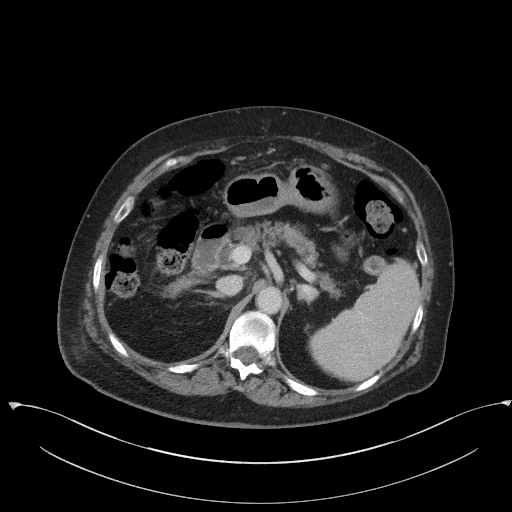
[im 75/106  bone]
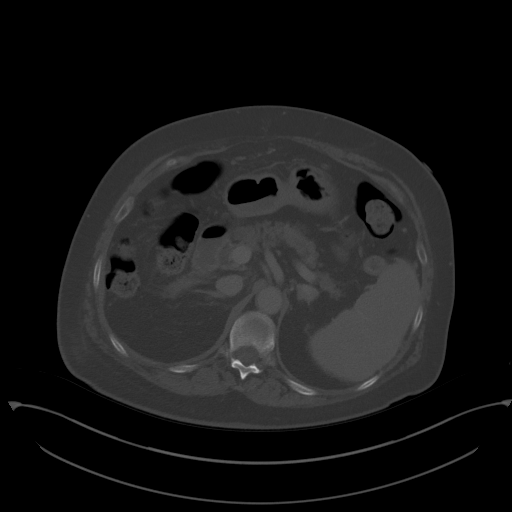
[im 81/106  soft-tissue]
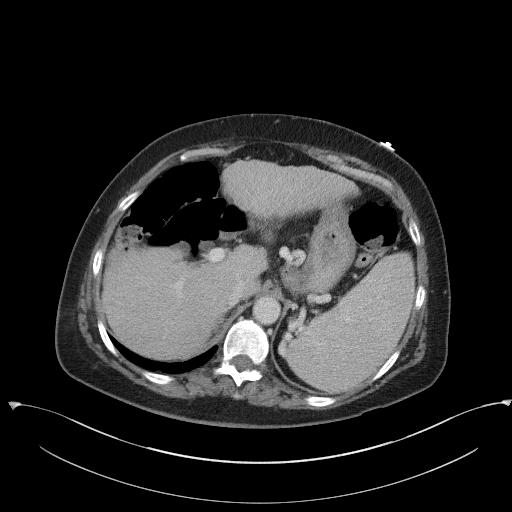
[im 93/106  soft-tissue]
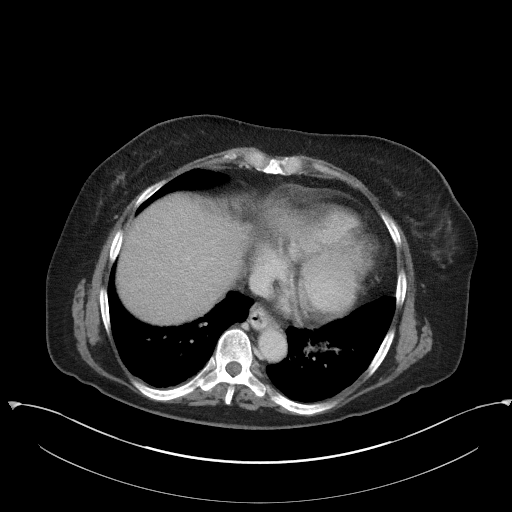
[im 99/106  soft-tissue]
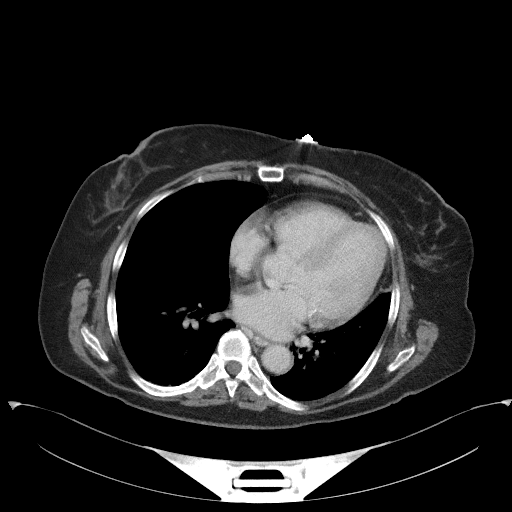

[Series 5: coronal st · coronal · 0.70mm/px · 3 of 97 slices shown]
[im 33/97  soft-tissue]
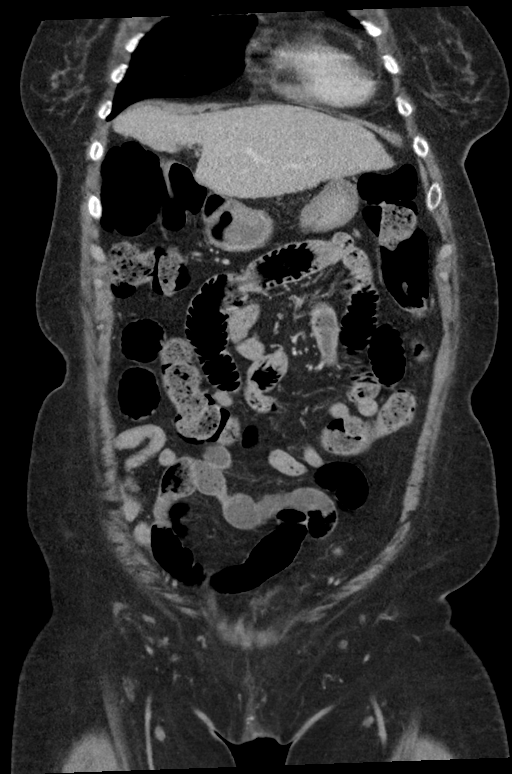
[im 43/97  soft-tissue]
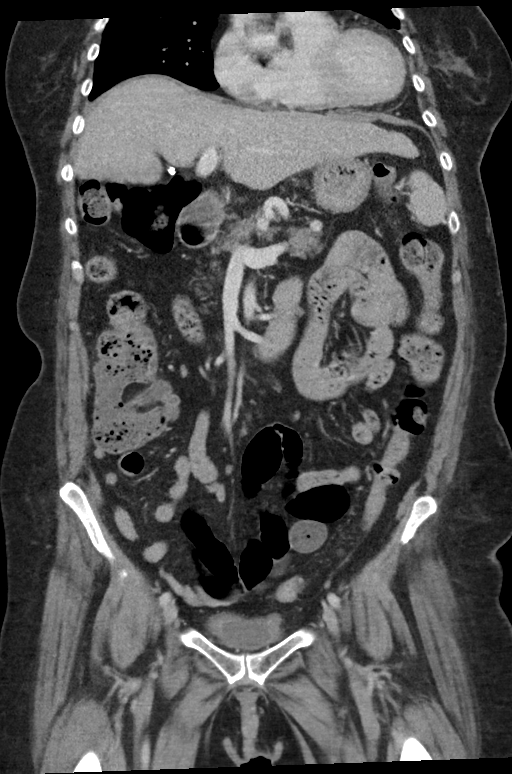
[im 54/97  soft-tissue]
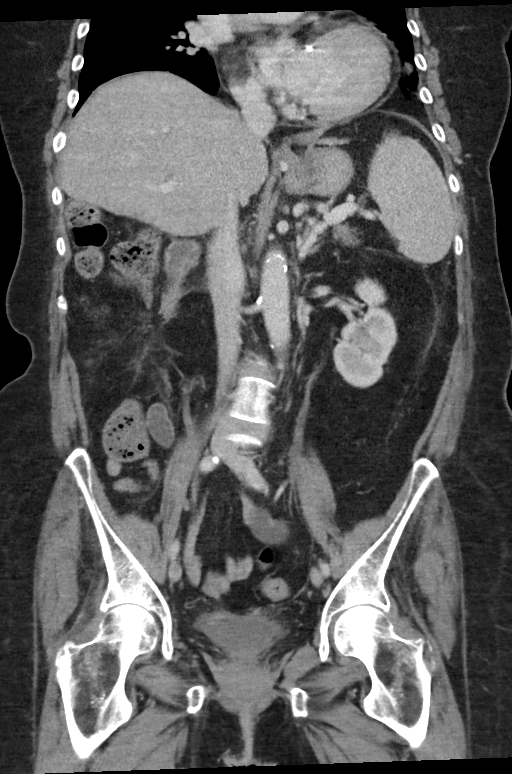

[15 of 46 positions shown; findings below may reference images not displayed]

FINDINGS: Lower chest: The lung bases are clear. The heart size is normal.

Hepatobiliary: The liver is cirrhotic. Status post
cholecystectomy.There is no biliary ductal dilation.

Pancreas: Normal contours without ductal dilatation. No
peripancreatic fluid collection.

Spleen: The spleen is enlarged.

Adrenals/Urinary Tract:

--Adrenal glands: Unremarkable.

--Right kidney/ureter: Right kidney is atrophic.

--Left kidney/ureter: No hydronephrosis or radiopaque kidney stones.

--Urinary bladder: There is diffuse bladder wall thickening.

Stomach/Bowel:

--Stomach/Duodenum: Esophageal varices are noted.

--Small bowel: Unremarkable.

--Colon: There is an above average amount of stool throughout the
colon.

--Appendix: Normal.

Vascular/Lymphatic: Atherosclerotic calcification is present within
the non-aneurysmal abdominal aorta, without hemodynamically
significant stenosis. Portosystemic shunting is noted.
Gastroesophageal varices are noted.

--No retroperitoneal lymphadenopathy.

--No mesenteric lymphadenopathy.

--No pelvic or inguinal lymphadenopathy.

Reproductive: Status post hysterectomy. No adnexal mass.

Other: No ascites or free air. The abdominal wall is normal.

Musculoskeletal. No acute displaced fractures.
IMPRESSION: 1. No acute abdominopelvic abnormality.
2. Cirrhosis with stigmata of portal hypertension.
3. Diffuse bladder wall thickening. Recommend correlation with
urinalysis to exclude cystitis.

Aortic Atherosclerosis ([id number]).

## 2022-10-03 IMAGING — CT CT CERVICAL SPINE W/O CM
3 of 4 series · 13 of 33 positions shown, 16 images · non-contrast
Comparison: None.

CLINICAL DATA: Fall neck pain

EXAM:
CT CERVICAL SPINE WITHOUT CONTRAST
TECHNIQUE: Multidetector CT imaging of the cervical spine was performed without
intravenous contrast. Multiplanar CT image reconstructions were also
generated.

[Series 4: sagittal bone · sagittal · 0.25mm/px · 5 of 58 slices shown, 6 images]
[im 20/58  bone]
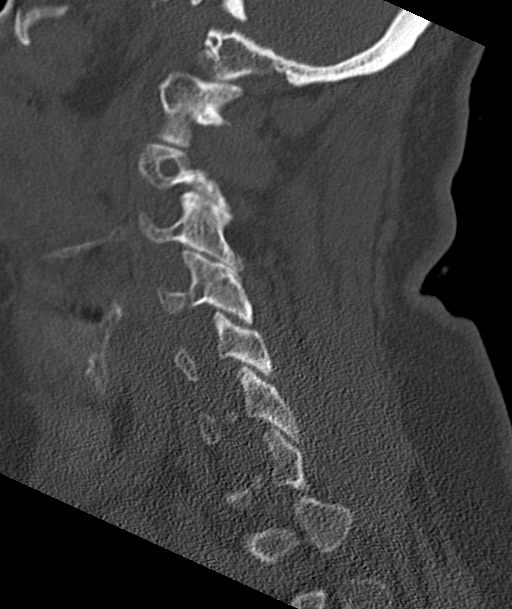
[im 24/58  bone]
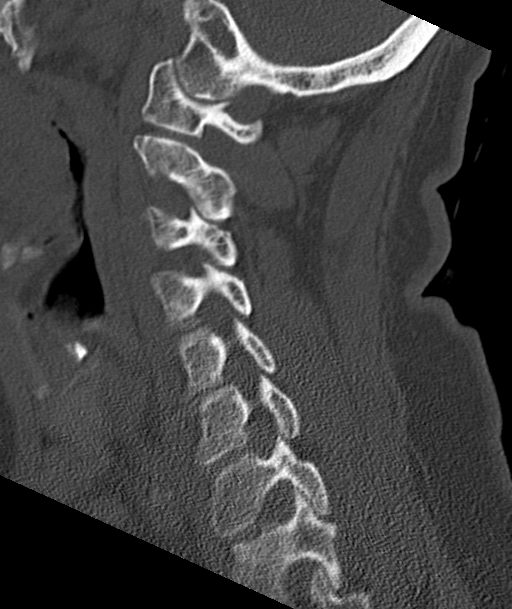
[im 29/58  soft-tissue]
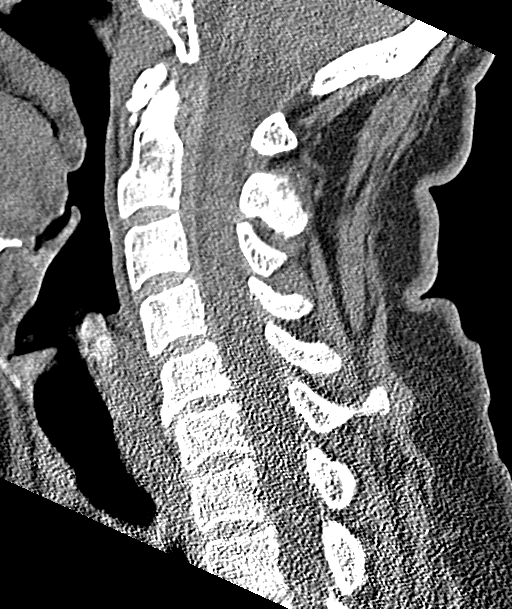
[im 29/58  bone]
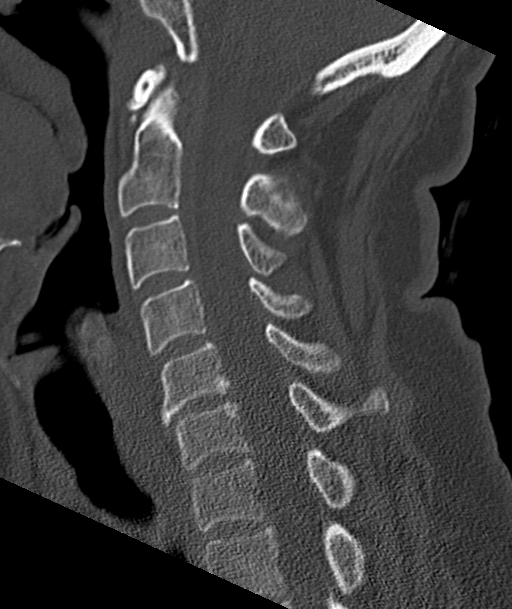
[im 34/58  bone]
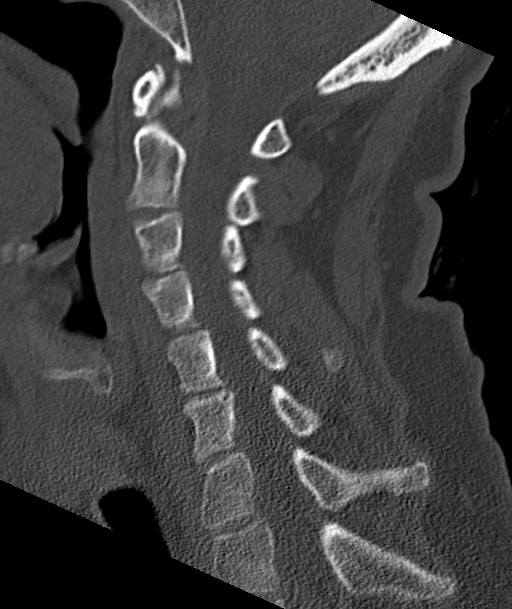
[im 39/58  bone]
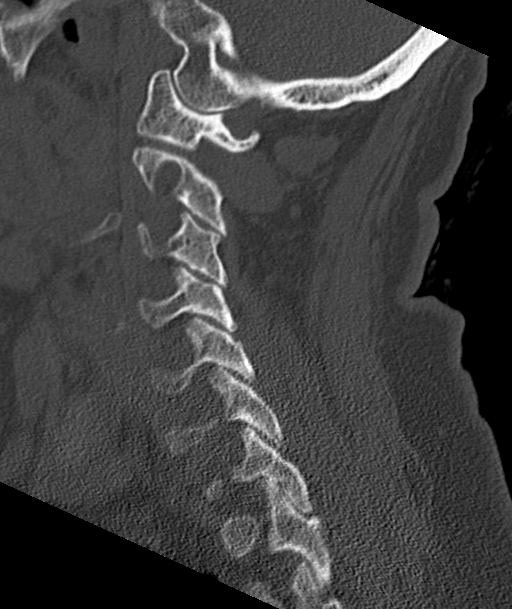

[Series 5: coronal bone · coronal · 0.26mm/px · 3 of 71 slices shown]
[im 21/71  bone]
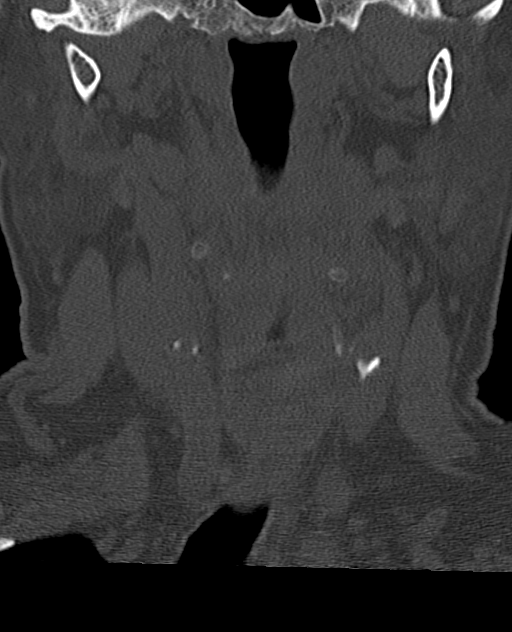
[im 31/71  bone]
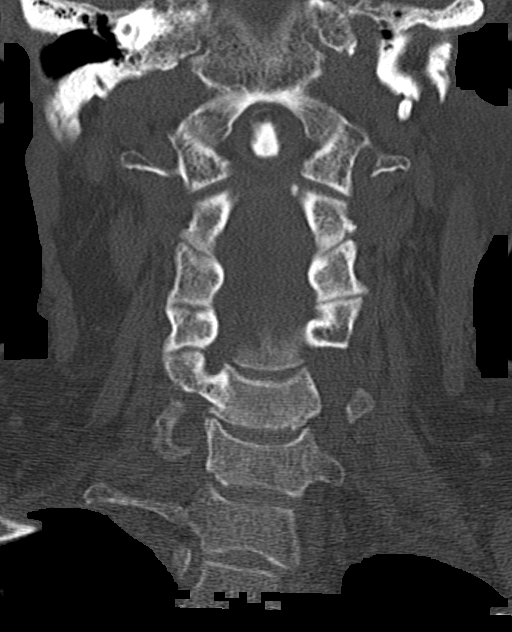
[im 41/71  bone]
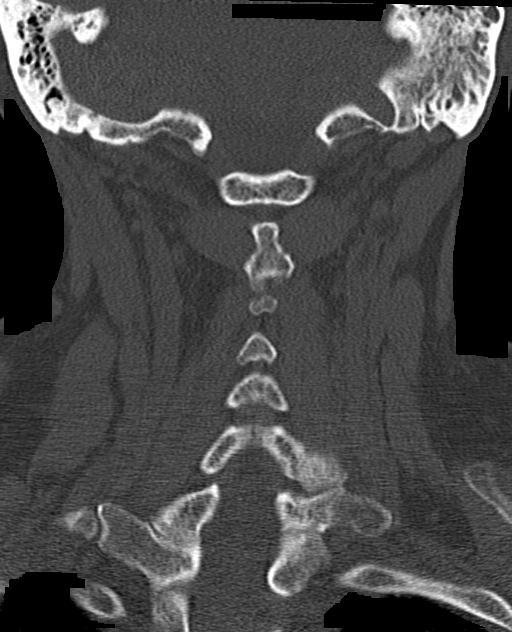

[Series 6: orthogonal bone · axial · 0.23mm/px · z∈[-171,-80]mm · 5 of 81 slices shown, 7 images]
[im 14/81  soft-tissue]
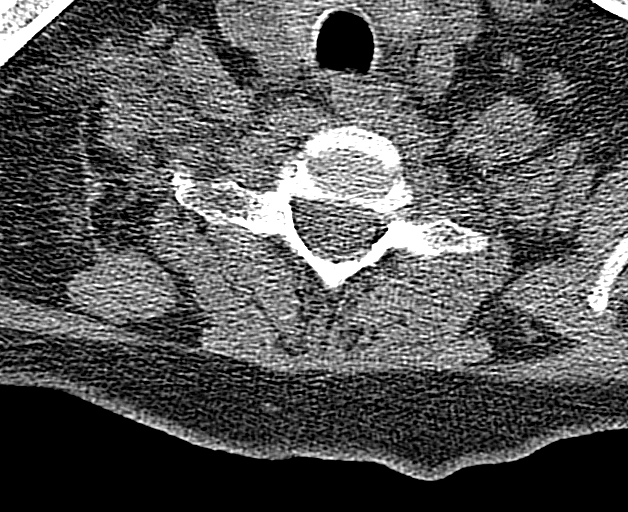
[im 14/81  bone]
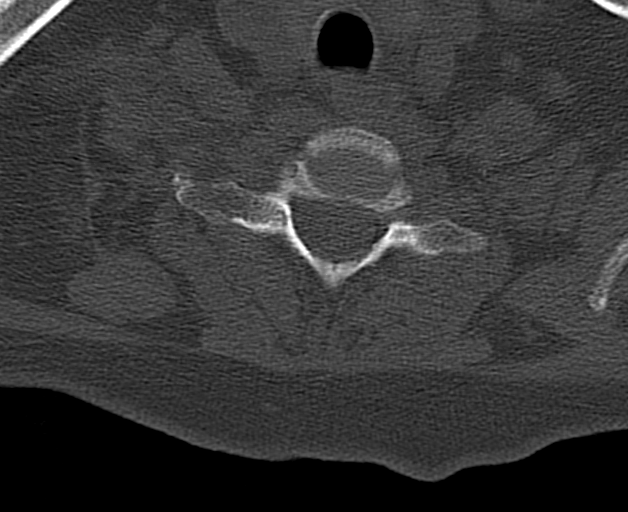
[im 27/81  bone]
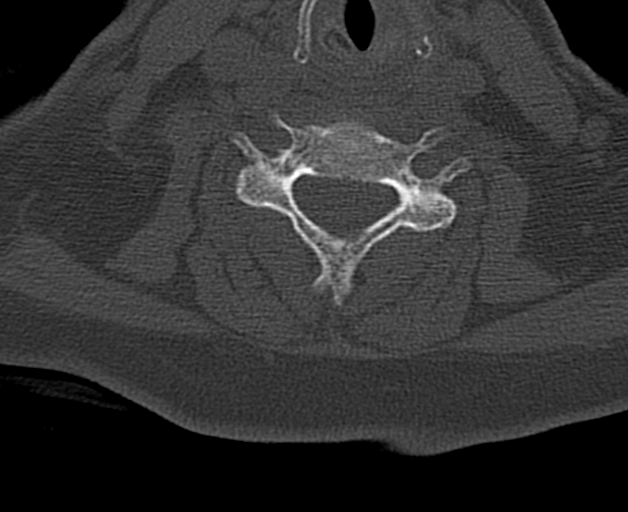
[im 41/81  bone]
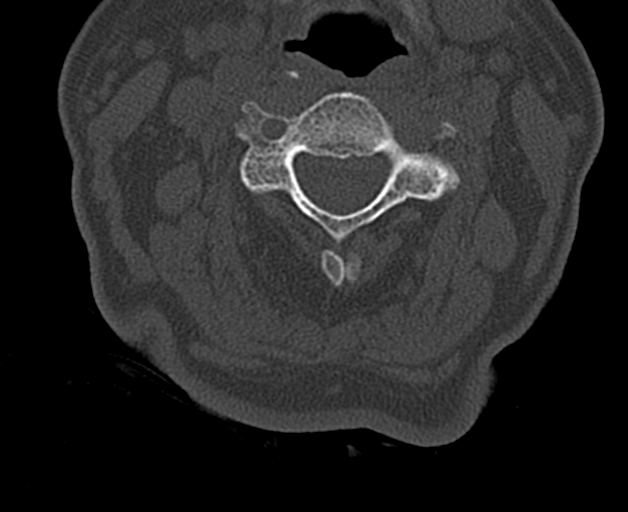
[im 54/81  bone]
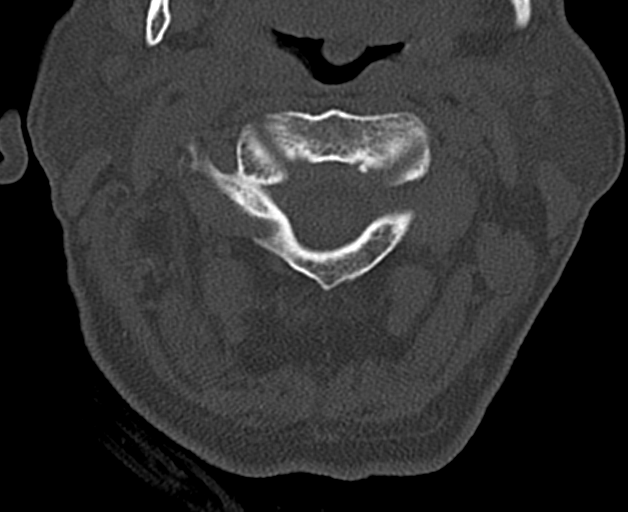
[im 67/81  soft-tissue]
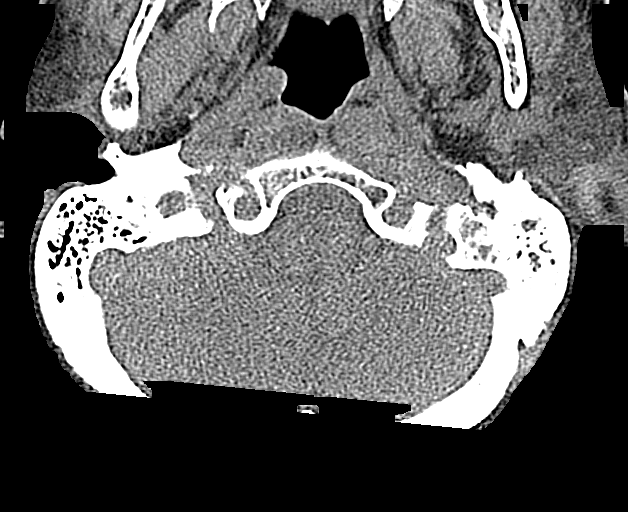
[im 67/81  bone]
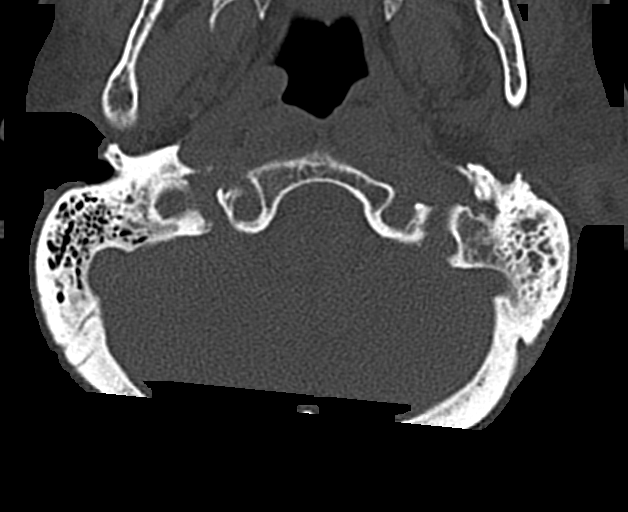

[13 of 33 positions shown; findings below may reference images not displayed]

FINDINGS: Alignment: Physiologic

Skull base and vertebrae: Visualized skull base is intact. No
atlanto-occipital dissociation. The vertebral body heights are well
maintained. No fracture or pathologic osseous lesion seen.

Soft tissues and spinal canal: The visualized paraspinal soft
tissues are unremarkable. No prevertebral soft tissue swelling is
seen. The spinal canal is grossly unremarkable, no large epidural
collection or significant canal narrowing.

Disc levels:  No significant canal or neural foraminal narrowing.

Upper chest: The lung apices are clear. Thoracic inlet is within
normal limits.

Other: None
IMPRESSION: No acute fracture or malalignment.

## 2022-10-03 IMAGING — CT CT HEAD W/O CM
3 series · 16 of 47 positions shown, 19 images · non-contrast
Comparison: MRI 09/27/2020, CT 09/27/2020, 08/27/2020

CLINICAL DATA: Headache fall

EXAM:
CT HEAD WITHOUT CONTRAST
TECHNIQUE: Contiguous axial images were obtained from the base of the skull
through the vertex without intravenous contrast.

[Series 3: head wo · axial · 0.43mm/px · z∈[-141,-16]mm · 10 of 30 slices shown, 13 images]
[im 3/30  brain]
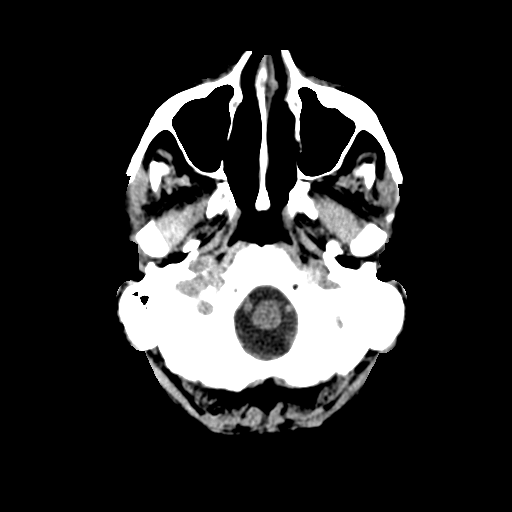
[im 3/30  bone]
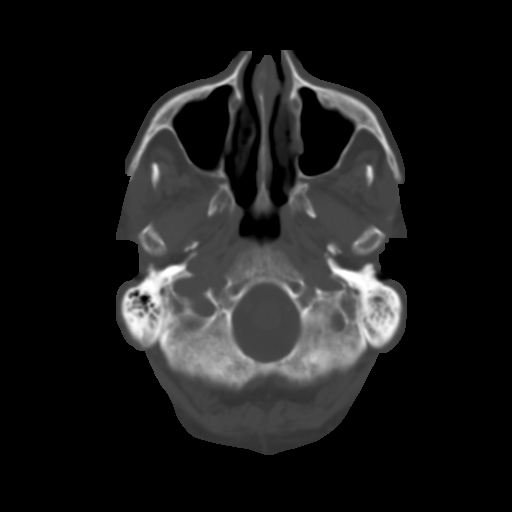
[im 6/30  brain]
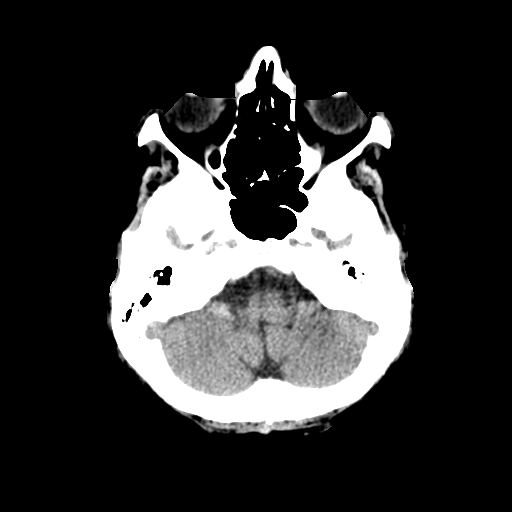
[im 9/30  brain]
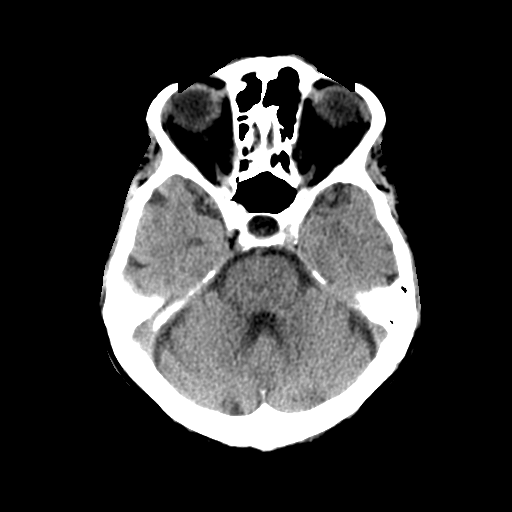
[im 11/30  brain]
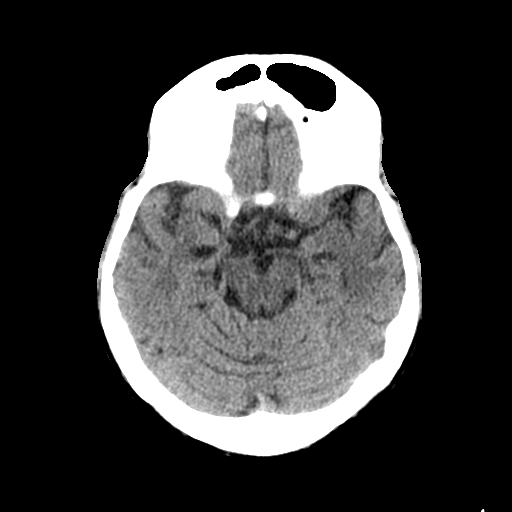
[im 14/30  brain]
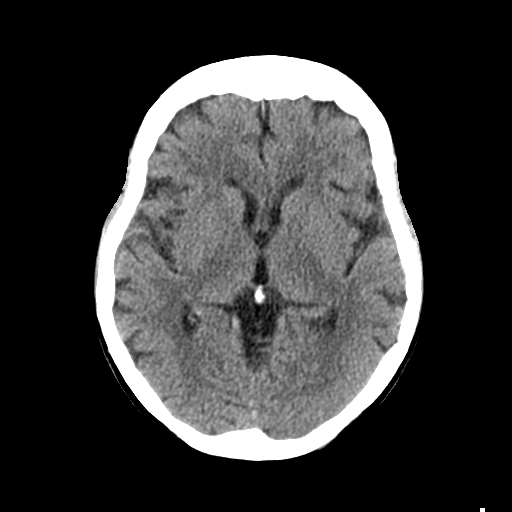
[im 14/30  bone]
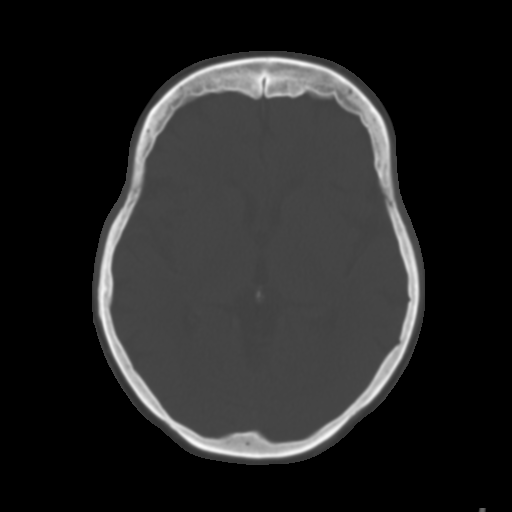
[im 17/30  brain]
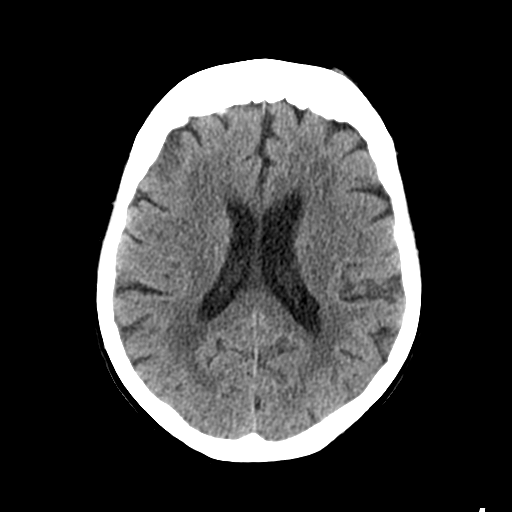
[im 20/30  brain]
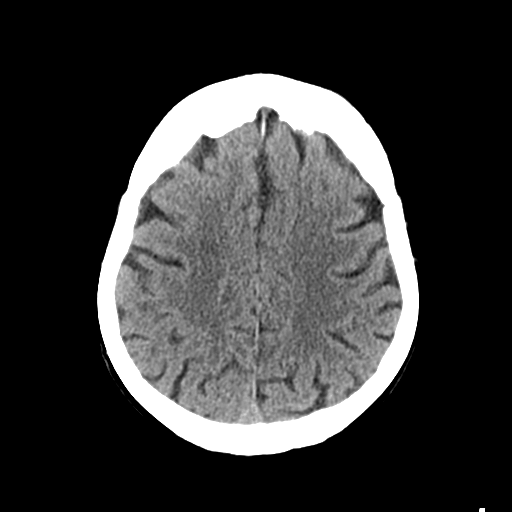
[im 23/30  brain]
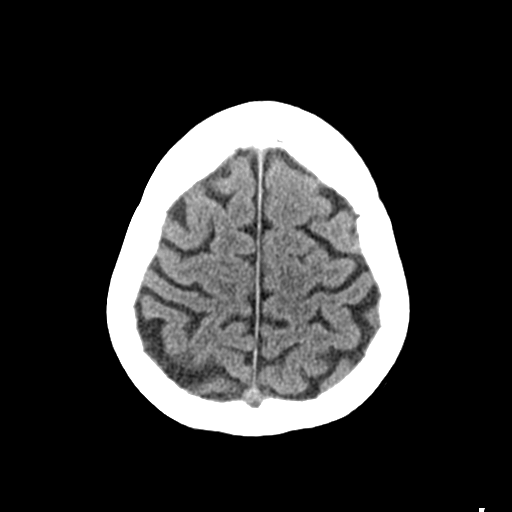
[im 25/30  brain]
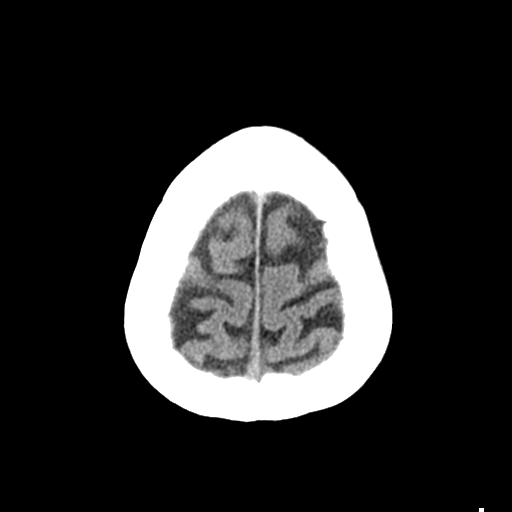
[im 25/30  bone]
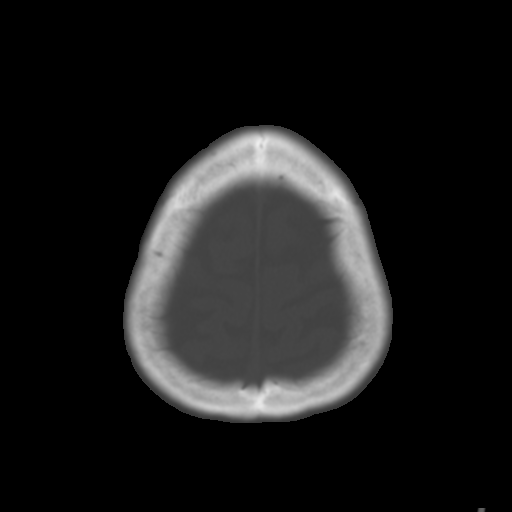
[im 28/30  brain]
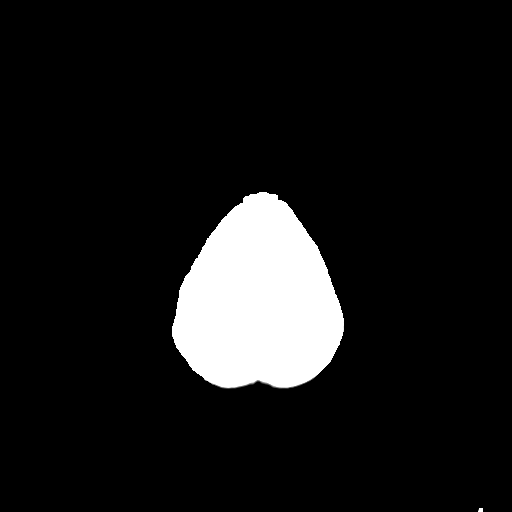

[Series 4: coronal soft tissue · coronal · 0.31mm/px · 3 of 66 slices shown]
[im 22/66  brain]
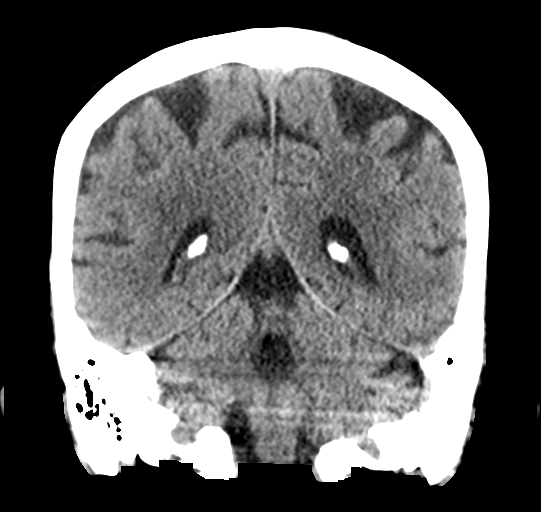
[im 29/66  brain]
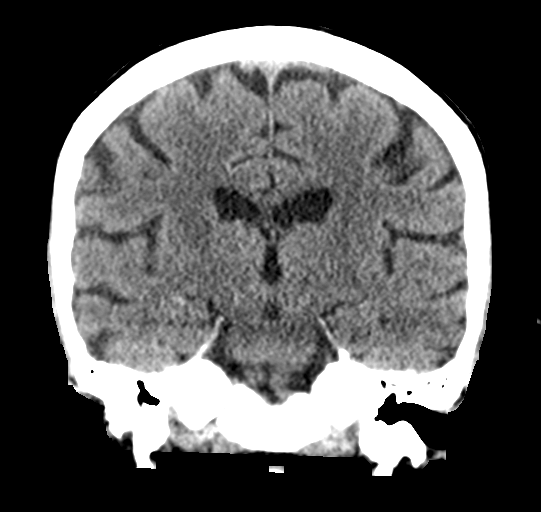
[im 37/66  brain]
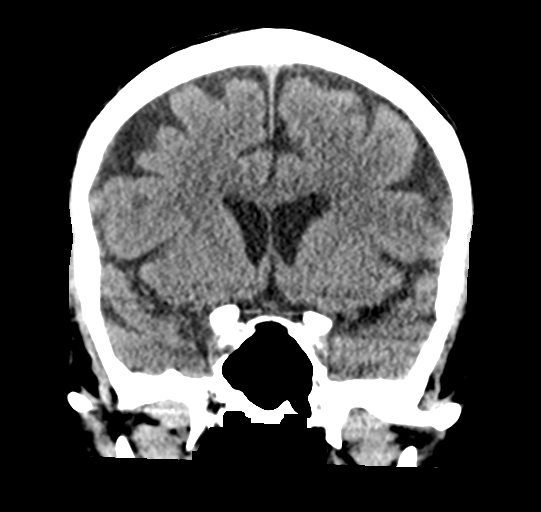

[Series 5: sagittal soft tissue · sagittal · 0.31mm/px · 3 of 51 slices shown]
[im 17/51  brain]
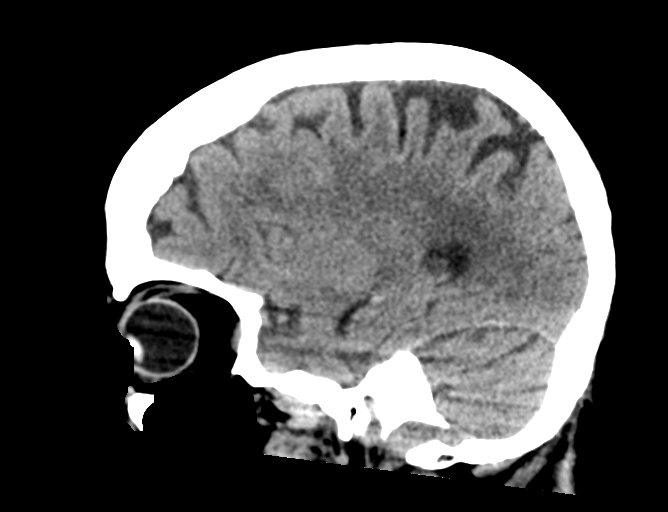
[im 26/51  brain]
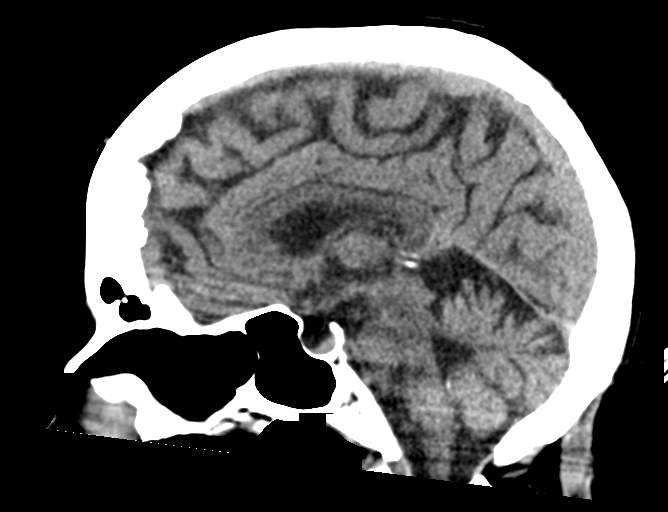
[im 34/51  brain]
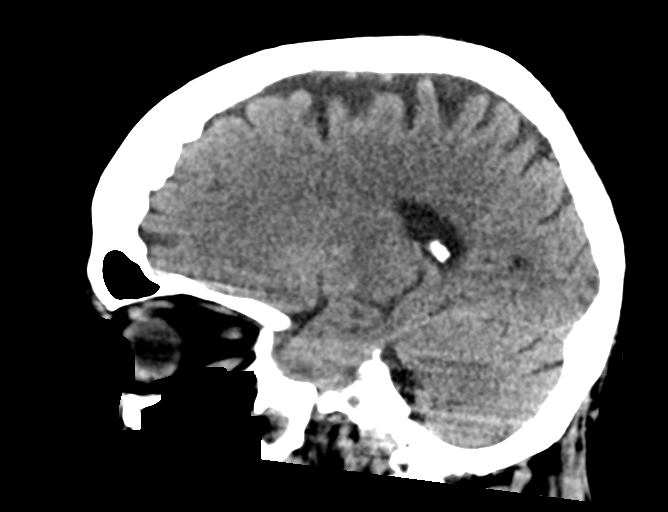

[16 of 47 positions shown; findings below may reference images not displayed]

FINDINGS: Brain: No acute territorial infarction, hemorrhage or intracranial
mass. Mild white matter hypodensity consistent with chronic small
vessel ischemic change. Mild atrophy. Stable ventricle size

Vascular: No hyperdense vessels.  Carotid vascular calcification

Skull: No fracture

Sinuses/Orbits: Mucosal thickening in the sinuses. Left mastoid
effusion and fluid in the left middle ear

Other: None
IMPRESSION: 1. No CT evidence for acute intracranial abnormality.
2. Atrophy and mild chronic small vessel ischemic change of the
white matter.
3. Left mastoid effusion and fluid in the left middle ear.

## 2022-11-05 IMAGING — CT CT HEAD W/O CM
4 of 5 series · 17 of 37 positions shown, 18 images · non-contrast
Comparison: Head CT 11/09/2020.

CLINICAL DATA: 64-year-old female with history of syncope and
confusion on Eliquis. Evaluate for intracranial hemorrhage.

EXAM:
CT HEAD WITHOUT CONTRAST
TECHNIQUE: Contiguous axial images were obtained from the base of the skull
through the vertex without intravenous contrast.

[Series 2: head wo · axial · 0.39mm/px · z∈[-116,-26]mm · 4 of 31 slices shown, 5 images]
[im 7/31  brain]
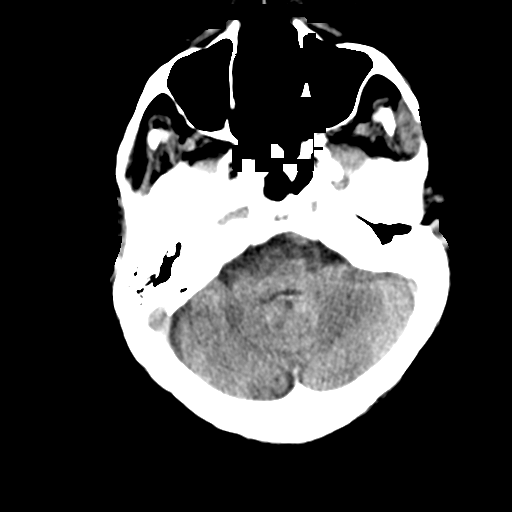
[im 7/31  bone]
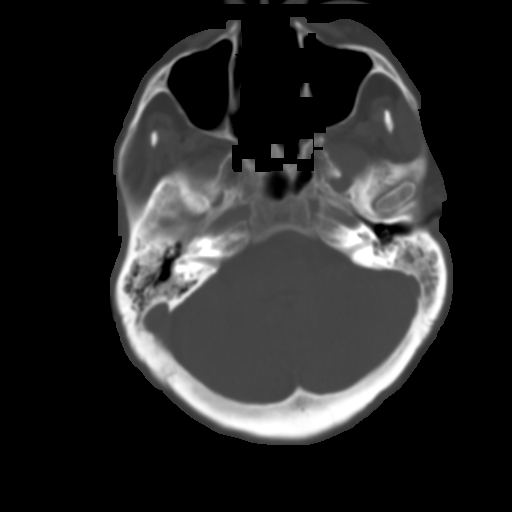
[im 13/31  brain]
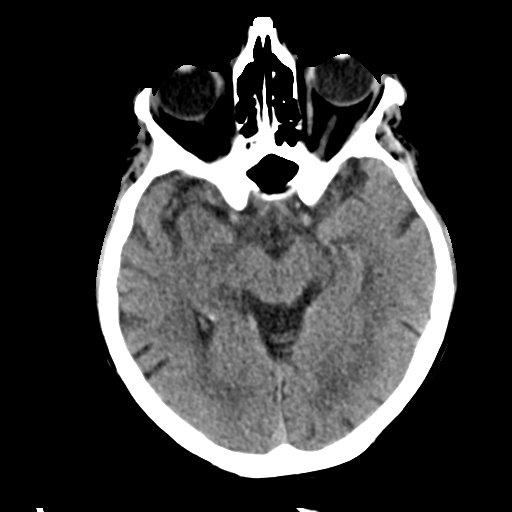
[im 19/31  brain]
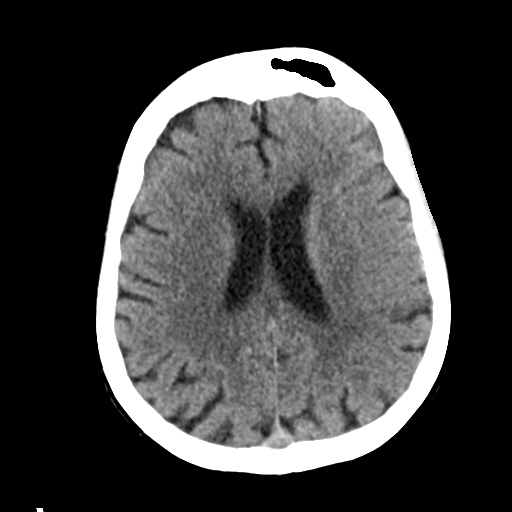
[im 25/31  brain]
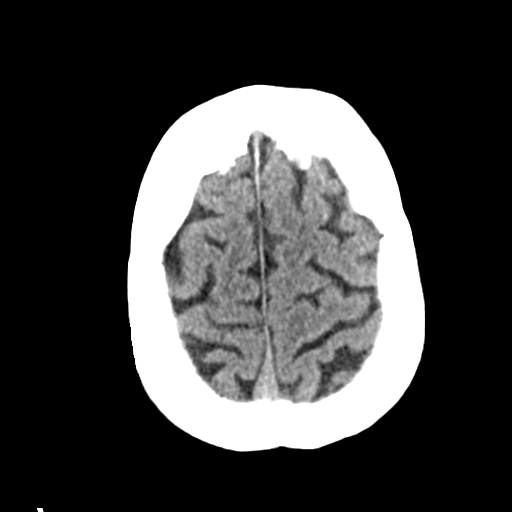

[Series 3: head bone · axial · 0.40mm/px · z∈[-140,-30]mm · 7 of 81 slices shown]
[im 7/81  bone]
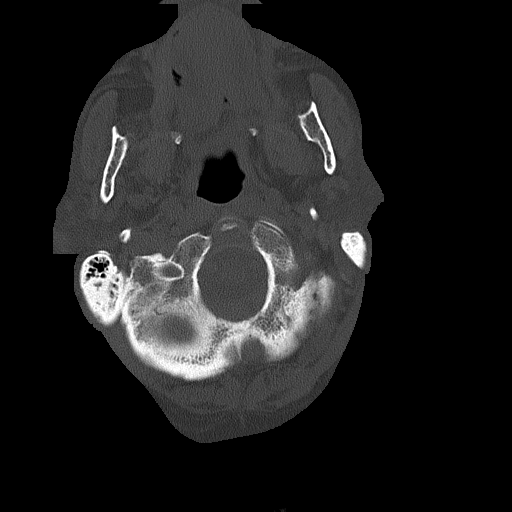
[im 19/81  bone]
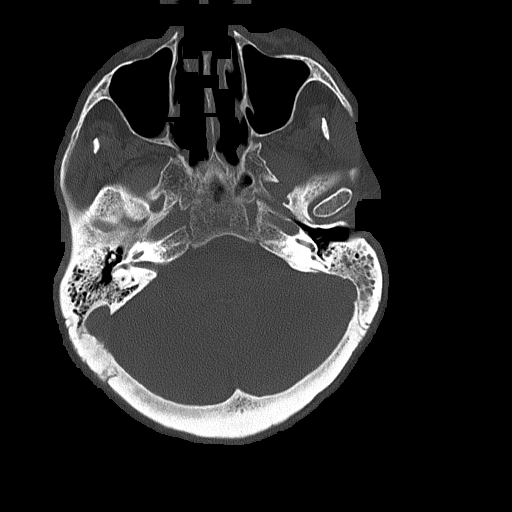
[im 25/81  bone]
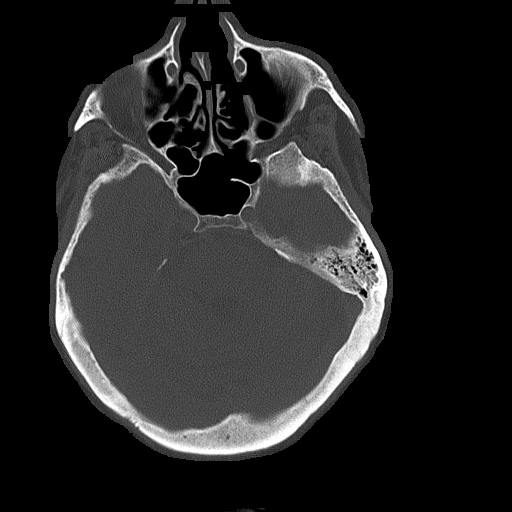
[im 37/81  bone]
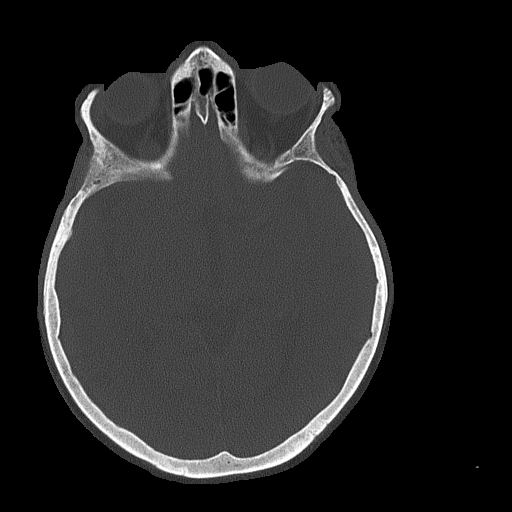
[im 44/81  bone]
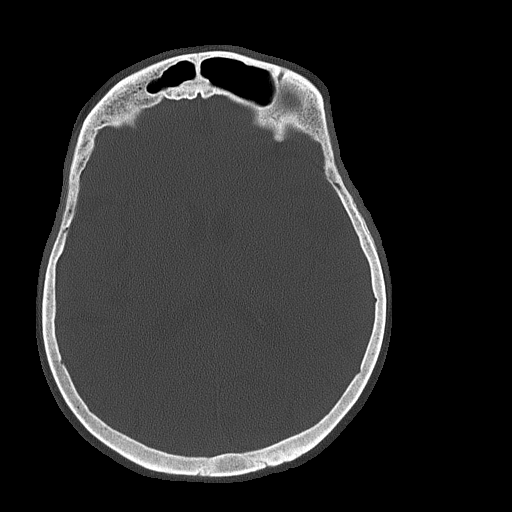
[im 56/81  bone]
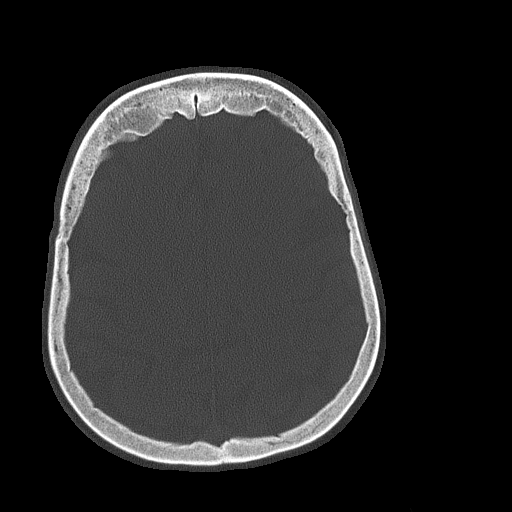
[im 62/81  bone]
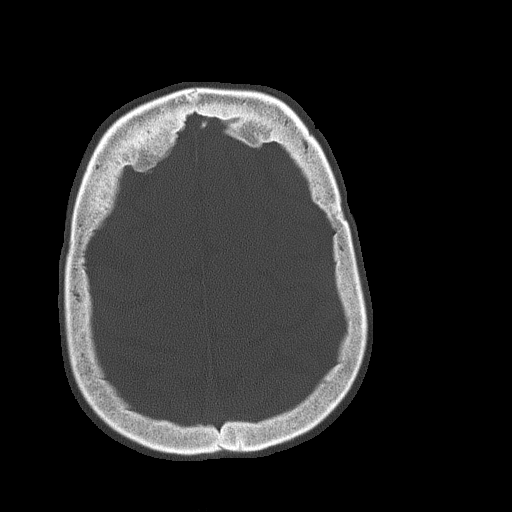

[Series 4: head wo recon · axial · 0.32mm/px · z∈[-86,-18]mm · 3 of 31 slices shown]
[im 8/31  brain]
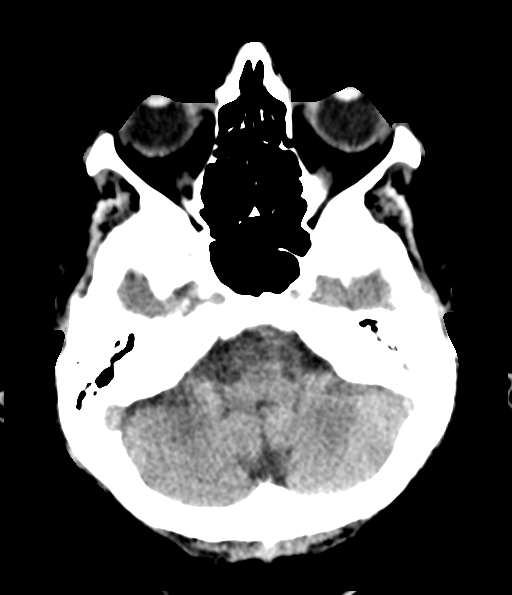
[im 16/31  brain]
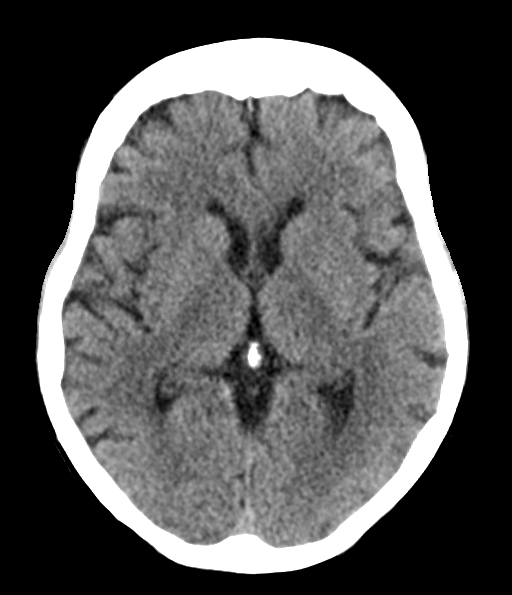
[im 23/31  brain]
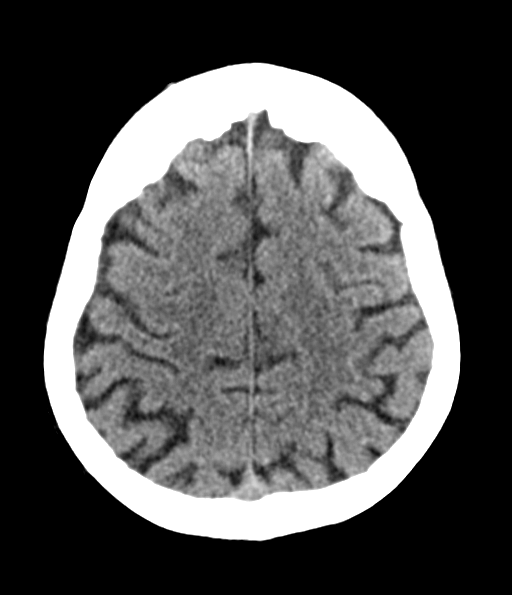

[Series 7: sagittal soft tissue · sagittal · 0.35mm/px · 3 of 50 slices shown]
[im 17/50  brain]
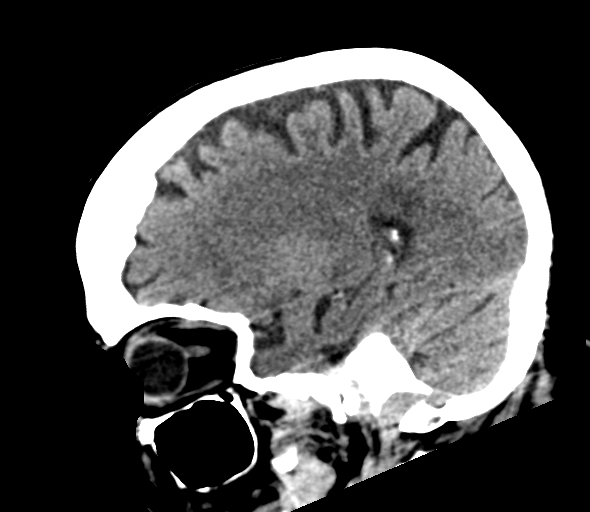
[im 25/50  brain]
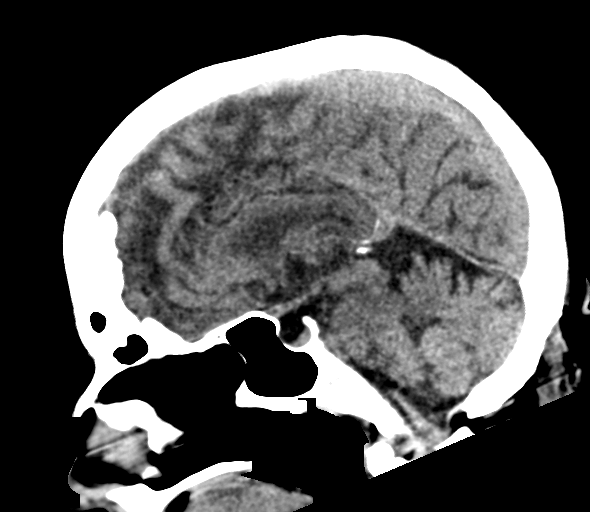
[im 33/50  brain]
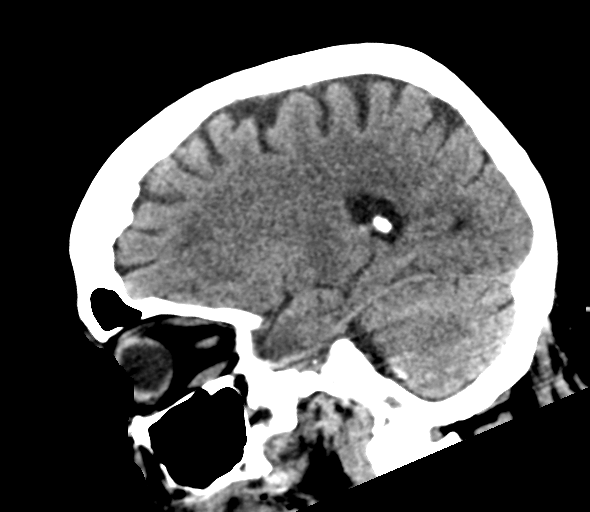

[17 of 37 positions shown; findings below may reference images not displayed]

FINDINGS: Brain: Mild cerebral atrophy. Patchy and confluent areas of
decreased attenuation are noted throughout the deep and
periventricular white matter of the cerebral hemispheres
bilaterally, compatible with chronic microvascular ischemic disease.
No evidence of acute infarction, hemorrhage, hydrocephalus,
extra-axial collection or mass lesion/mass effect.

Vascular: No hyperdense vessel or unexpected calcification.

Skull: Normal. Negative for fracture or focal lesion.

Sinuses/Orbits: Paranasal sinuses are clear. Right mastoid is well
pneumatized. Large left mastoid effusion.

Other: None.
IMPRESSION: 1. No acute intracranial abnormality. Specifically, no evidence of
acute intracranial hemorrhage.
2. Large left mastoid effusion. Clinical correlation for signs and
symptoms of mastoiditis is recommended.
3. Mild cerebral atrophy with chronic microvascular ischemic changes
in the cerebral white matter, as above.

## 2022-11-05 IMAGING — MR MR HEAD W/O CM
11 series · 44 of 48 positions shown · non-contrast
Comparison: Prior CT from earlier the same day as well as previous
MRI from 09/27/2020.

CLINICAL DATA: Initial evaluation for memory loss, mental status
change.

EXAM:
MRI HEAD WITHOUT CONTRAST
TECHNIQUE: Multiplanar, multiecho pulse sequences of the brain and surrounding
structures were obtained without intravenous contrast.

[Series 5: ax dwi_tracew · axial · 3.0mm · 0.65mm/px · z∈[-89,+65]mm · 4 of 48 slices shown]
[im 1/48]
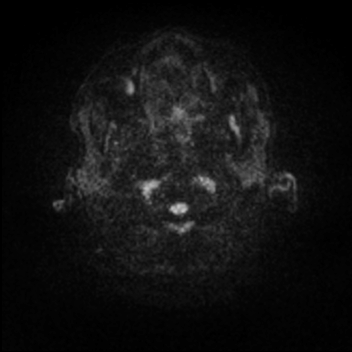
[im 16/48]
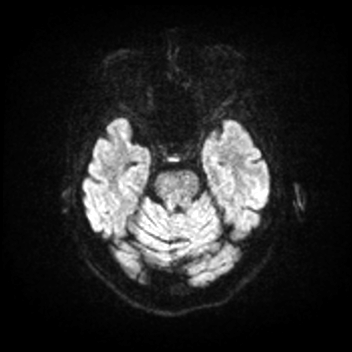
[im 32/48]
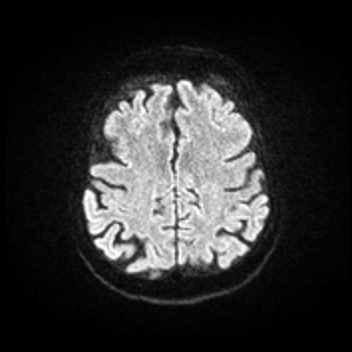
[im 48/48]
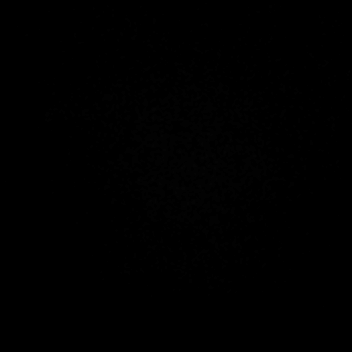

[Series 6: ax dwi_adc · axial · 3.0mm · 0.65mm/px · z∈[-89,+58]mm · 4 of 46 slices shown]
[im 1/46]
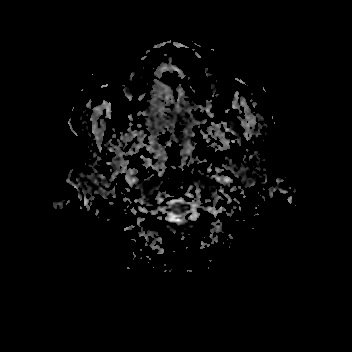
[im 16/46]
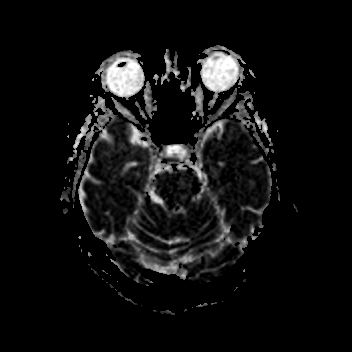
[im 31/46]
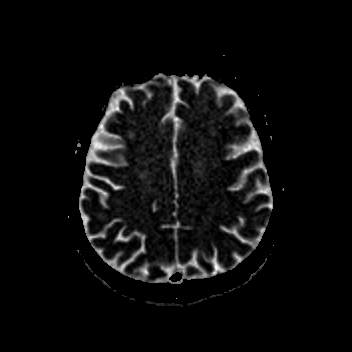
[im 46/46]
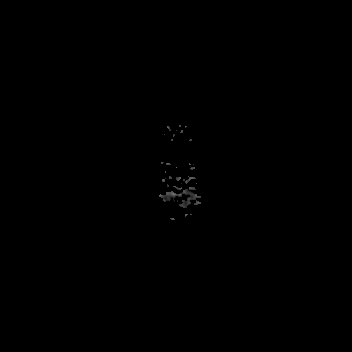

[Series 7: cor dwi_tracew · coronal · 5.0mm · 0.65mm/px · 3 of 40 slices shown]
[im 1/40]
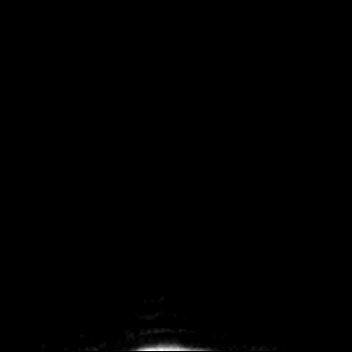
[im 20/40]
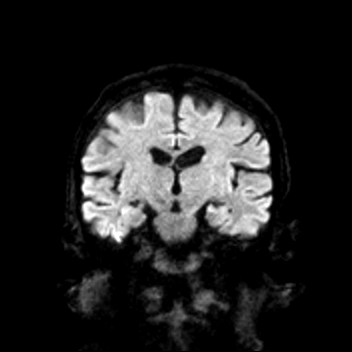
[im 40/40]
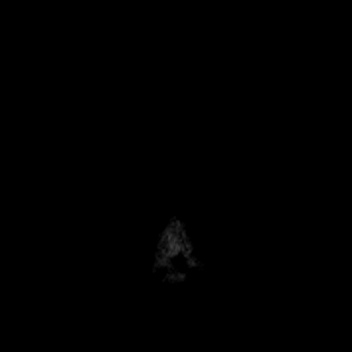

[Series 8: cor dwi_adc · coronal · 5.0mm · 0.65mm/px · 3 of 37 slices shown]
[im 1/37]
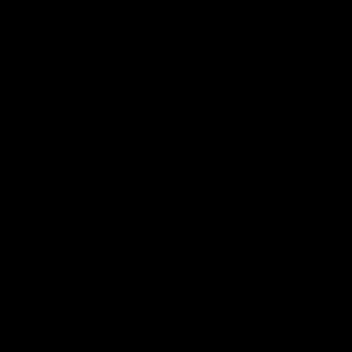
[im 19/37]
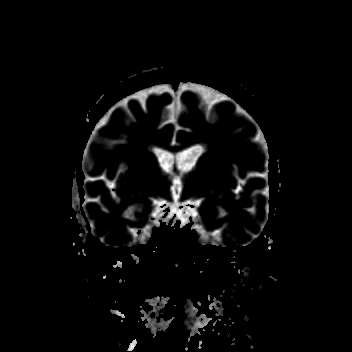
[im 37/37]
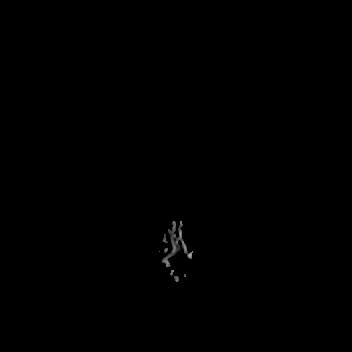

[Series 9: T1 · sagittal · 5.0mm · 0.62mm/px · 2 of 25 slices shown (1 of 2)]
[im 1/25]
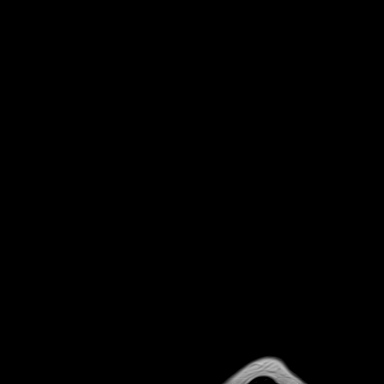
[im 25/25]
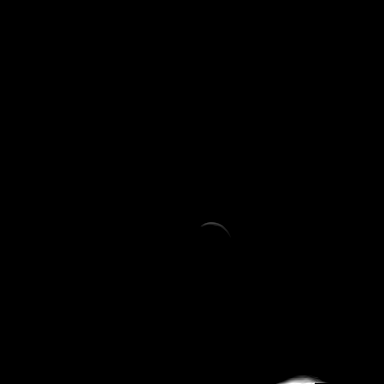

[Series 10: T2 · axial · 5.0mm · 0.53mm/px · z∈[-84,+59]mm · 2 of 25 slices shown (1 of 2)]
[im 1/25]
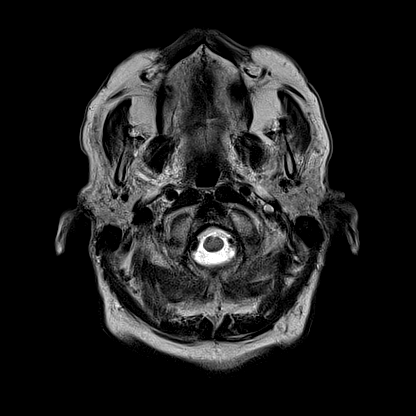
[im 25/25]
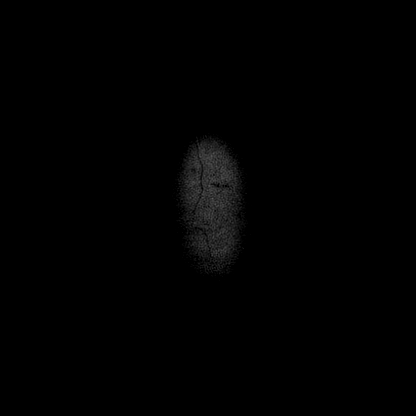

[Series 11: FLAIR · axial · 3.0mm · 0.53mm/px · z∈[-93,+68]mm · 4 of 55 slices shown]
[im 1/55]
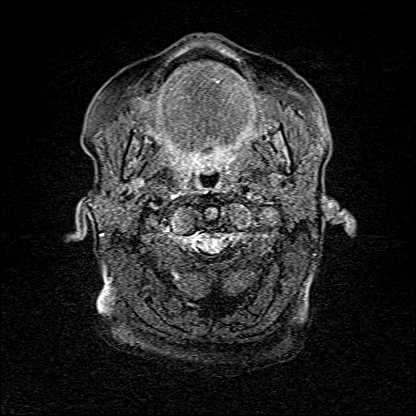
[im 19/55]
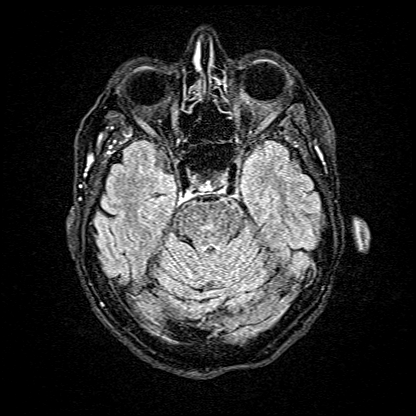
[im 37/55]
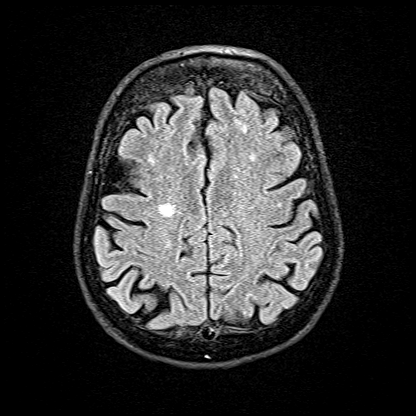
[im 55/55]
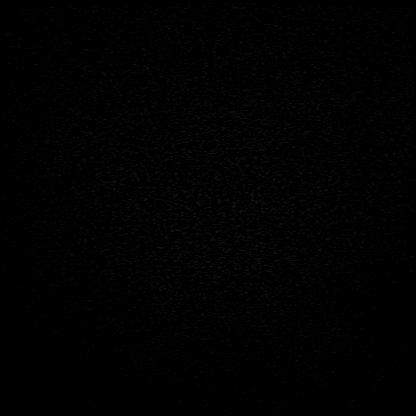

[Series 12: T1 · axial · 1.0mm · 0.98mm/px · z∈[-98,+76]mm · 10 of 176 slices shown (2 of 2)]
[im 1/176]
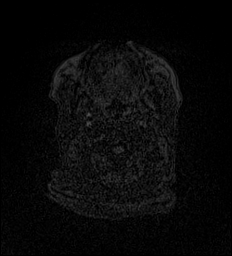
[im 14/176]
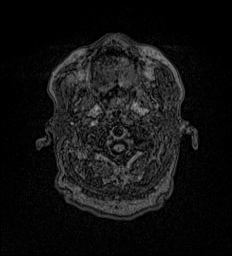
[im 27/176]
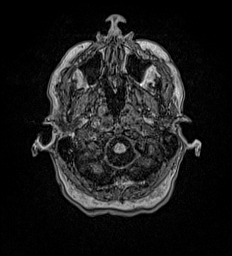
[im 41/176]
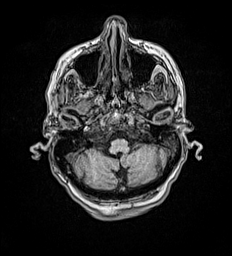
[im 54/176]
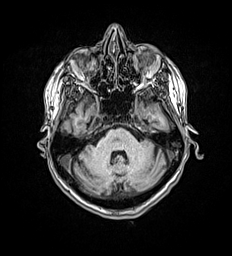
[im 81/176]
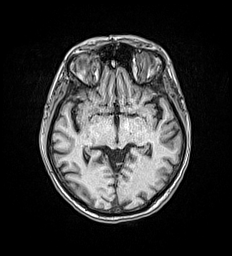
[im 95/176]
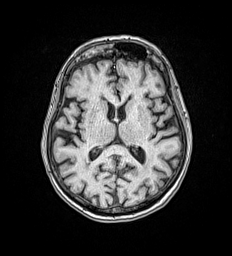
[im 122/176]
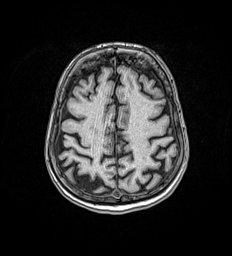
[im 149/176]
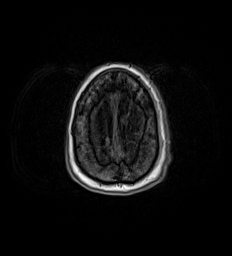
[im 176/176]
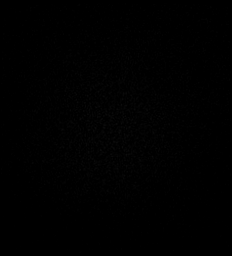

[Series 14: pha_images · axial · 3.0mm · 0.90mm/px · z∈[-91,+76]mm · 5 of 56 slices shown]
[im 1/56]
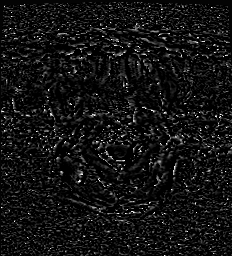
[im 14/56]
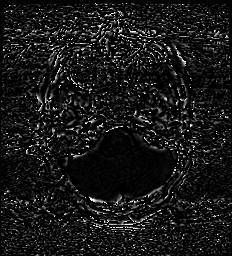
[im 28/56]
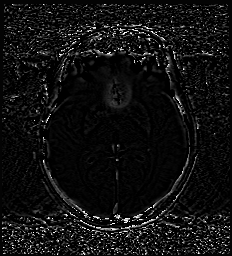
[im 42/56]
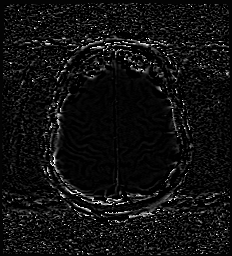
[im 56/56]
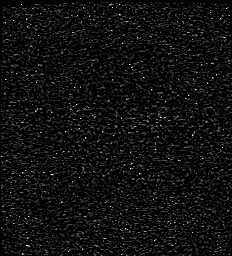

[Series 15: swi_images · axial · 3.0mm · 0.90mm/px · z∈[-100,+76]mm · 5 of 60 slices shown]
[im 1/60]
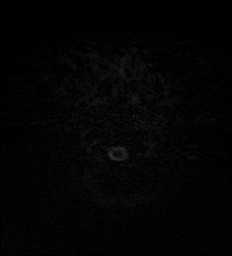
[im 15/60]
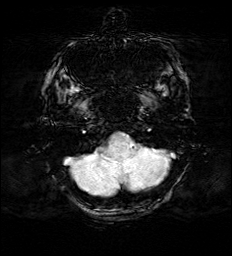
[im 30/60]
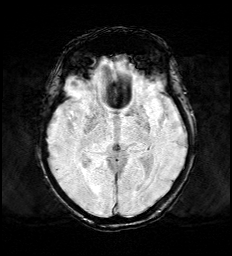
[im 45/60]
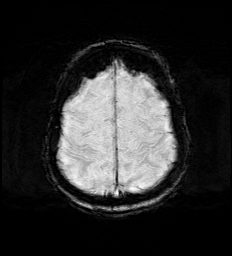
[im 60/60]
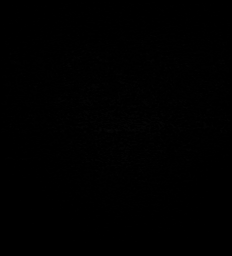

[Series 17: T2 · coronal · 5.0mm · 0.57mm/px · 2 of 29 slices shown (2 of 2)]
[im 1/29]
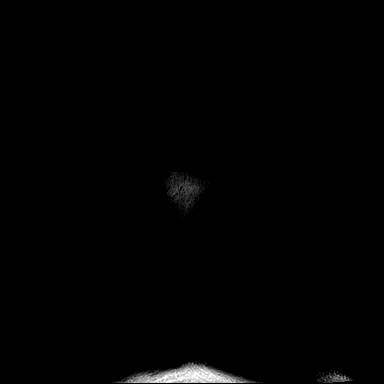
[im 29/29]
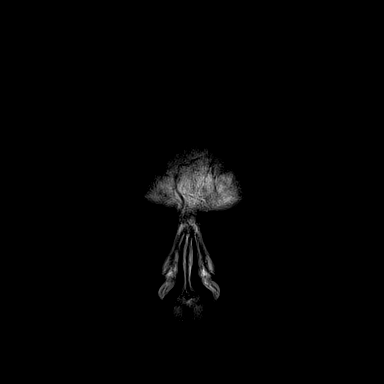

[44 of 48 positions shown; findings below may reference images not displayed]

FINDINGS: Brain: Mild age-related cerebral volume loss, stable. Patchy
T2/FLAIR hyperintensity within the periventricular and deep white
matter both cerebral hemispheres as well as the pons, most
consistent with chronic small vessel ischemic disease, also stable.
Superimposed probable small remote lacunar infarct at the central
pons again noted. Small remote right cerebellar and left occipital
cortical infarcts again noted as well, also stable.

No abnormal foci of restricted diffusion to suggest acute or
subacute ischemia. Gray-white matter differentiation otherwise
maintained. No other areas of encephalomalacia to suggest chronic
cortical infarction. No acute or chronic intracranial hemorrhage.

No mass lesion, mass effect, or midline shift. No hydrocephalus or
extra-axial fluid collection. Pituitary gland and suprasellar region
normal.

Vascular: Mildly increased FLAIR hyperintensity within the left
transverse sinus without T1 correlate, likely reflecting slow flow.
Major intracranial vascular flow voids are otherwise maintained.

Skull and upper cervical spine: Craniocervical junction within
normal limits. Bone marrow signal intensity normal. No scalp soft
tissue abnormality.

Sinuses/Orbits: Globes and orbital soft tissues within normal
limits. Mild scattered mucosal thickening noted within the ethmoidal
air cells. Paranasal sinuses are otherwise clear. Chronic left
mastoid and middle ear effusion, stable from previous. Visualized
nasopharynx within normal limits.

Other: None.
IMPRESSION: 1. No acute intracranial abnormality.
2. Age-related cerebral atrophy with mild-to-moderate chronic
microvascular ischemic disease, with a few scattered small remote
ischemic infarcts as above, stable.
3. Chronic left mastoid and middle ear effusion, stable from
previous.

## 2022-11-05 IMAGING — DX DG CHEST 1V PORT
1 series · 1 of 1 positions shown · non-contrast
Comparison: 11/01/2020

CLINICAL DATA: Hyperglycemia.  Altered mental status.

EXAM:
PORTABLE CHEST 1 VIEW

[chest ap]
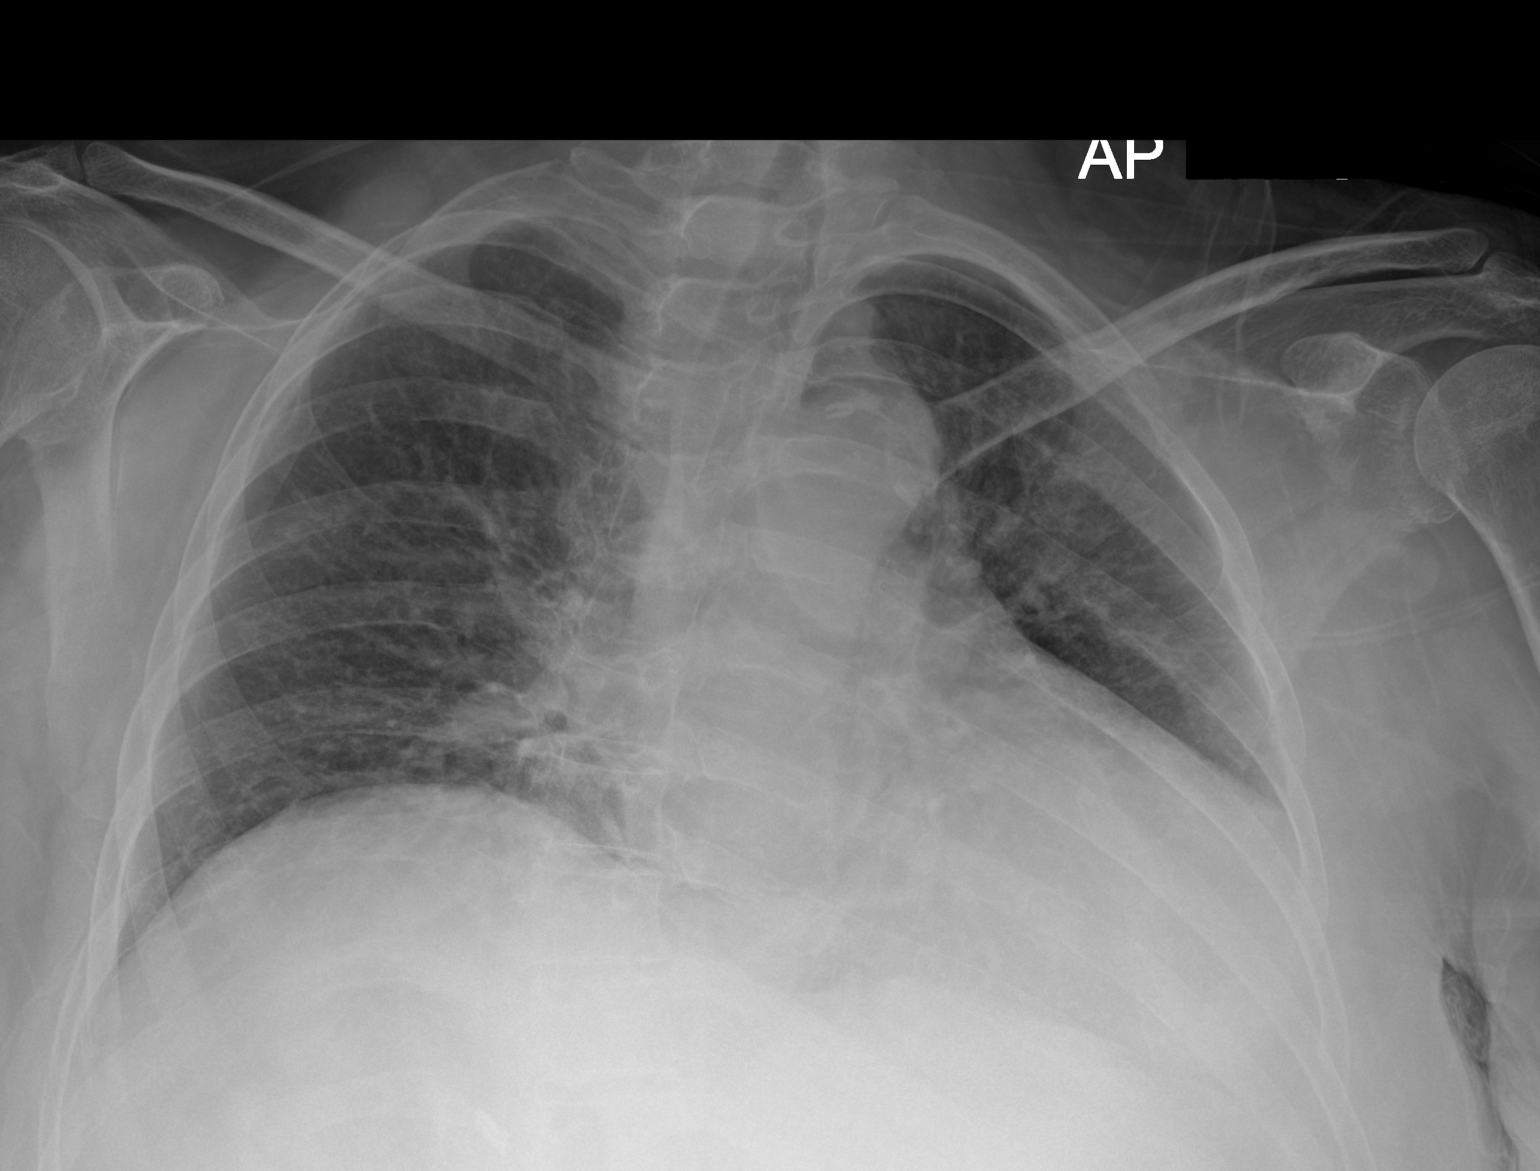

[1 of 1 positions shown; findings below may reference images not displayed]

FINDINGS: Shallow inspiration. Heart size and pulmonary vascularity are
probably normal for inspiratory effort. No airspace disease or
consolidation in the lungs. Vascular crowding. No pleural effusions.
No pneumothorax. Mediastinal contours appear intact.
IMPRESSION: Shallow inspiration.  No evidence of active pulmonary disease.

## 2022-11-07 IMAGING — US US EXTREM  UP VENOUS*L*
1 series · 13 of 24 positions shown · non-contrast
Comparison: None.

CLINICAL DATA: Left upper extremity pain and edema. Swelling at the
site of peripheral IV infusion. Evaluate for DVT.



[Series 1: us venous img upper uni left (dvt) · portal-venous · 13 of 31 slices shown]
[im 1/31]
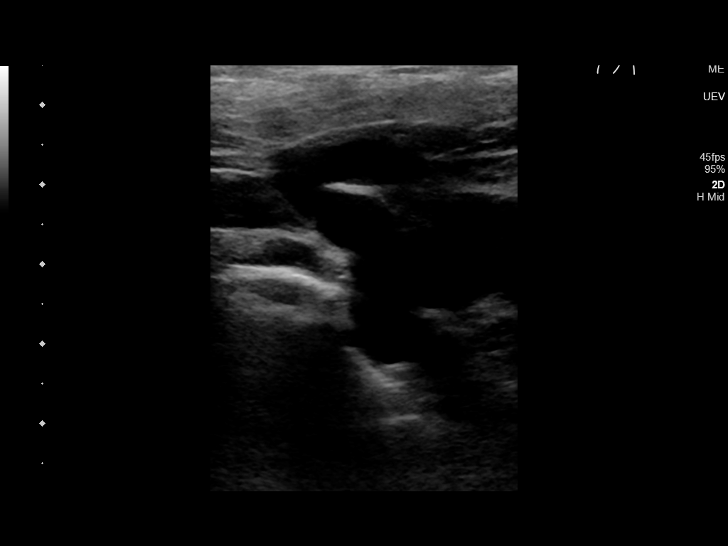
[im 3/31]
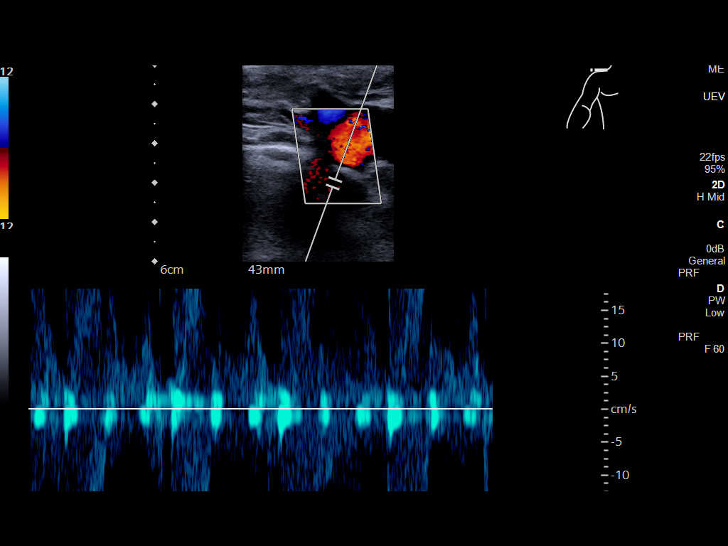
[im 6/31]
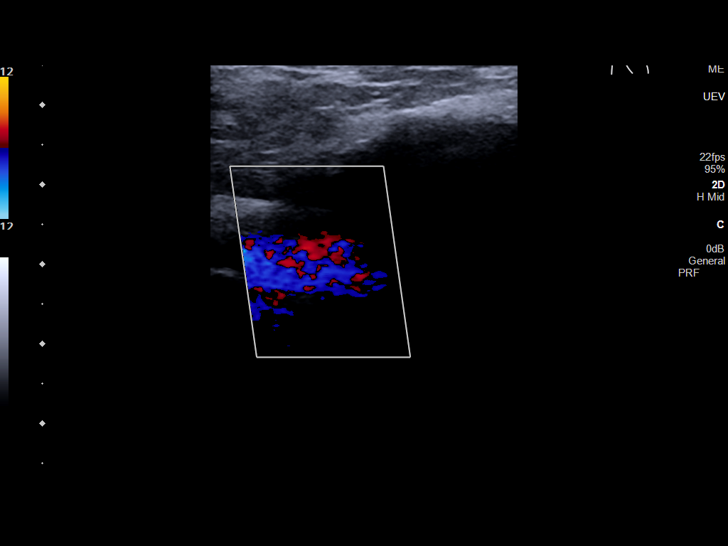
[im 8/31]
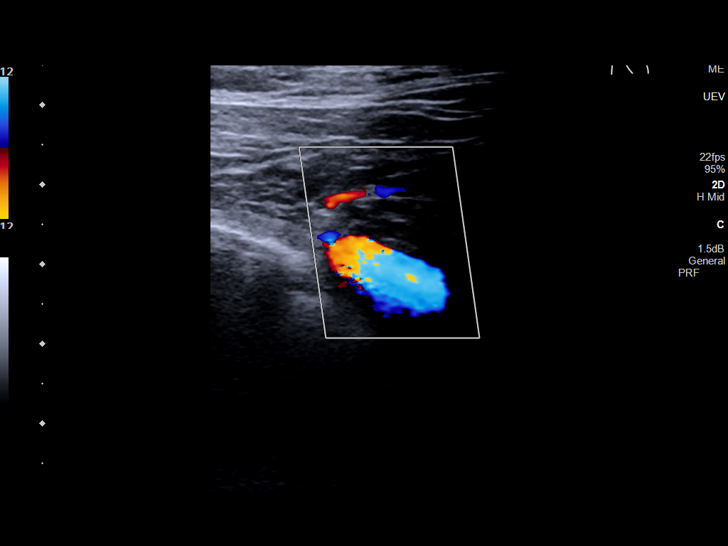
[im 11/31]
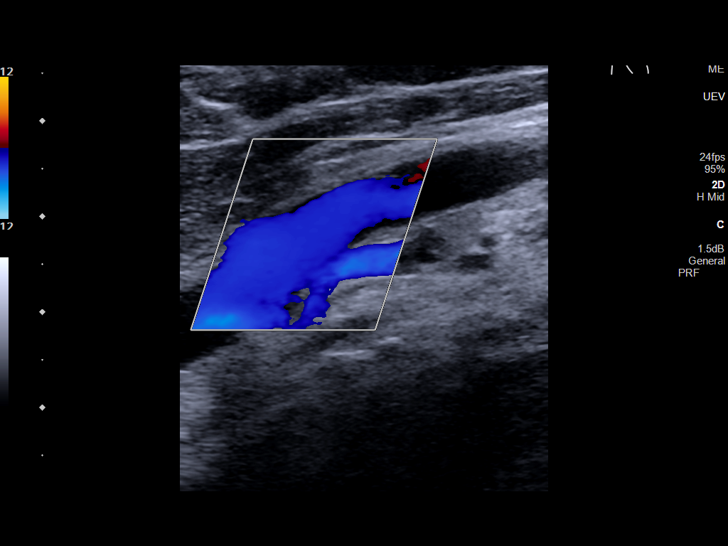
[im 14/31]
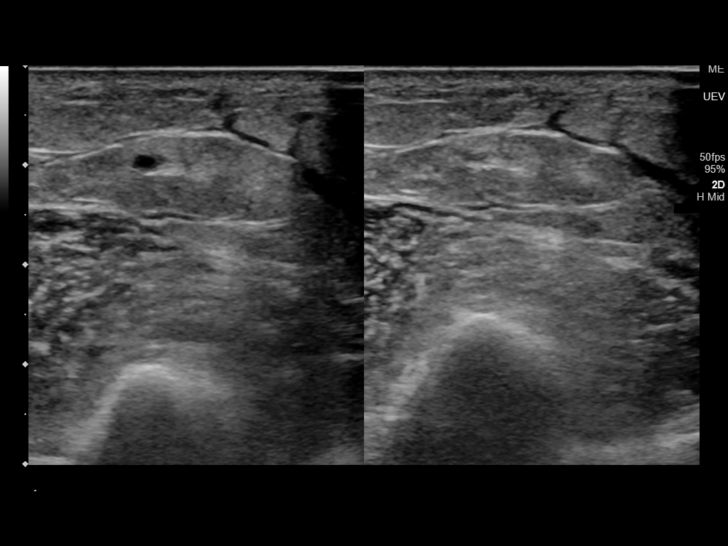
[im 16/31]
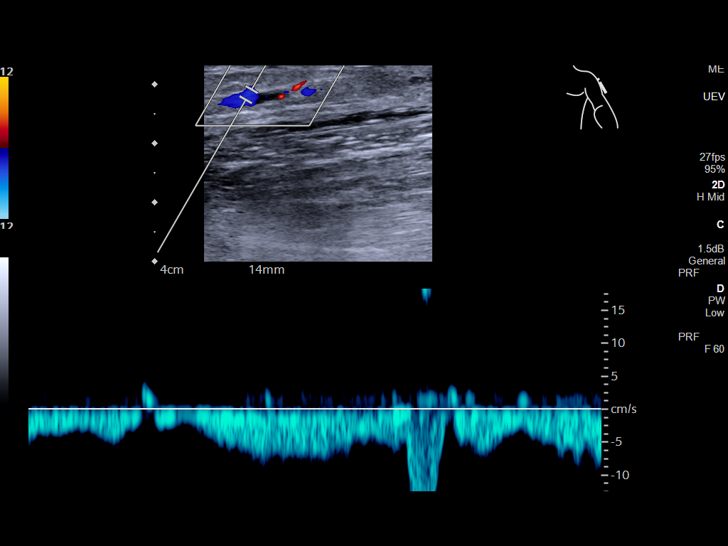
[im 17/31]
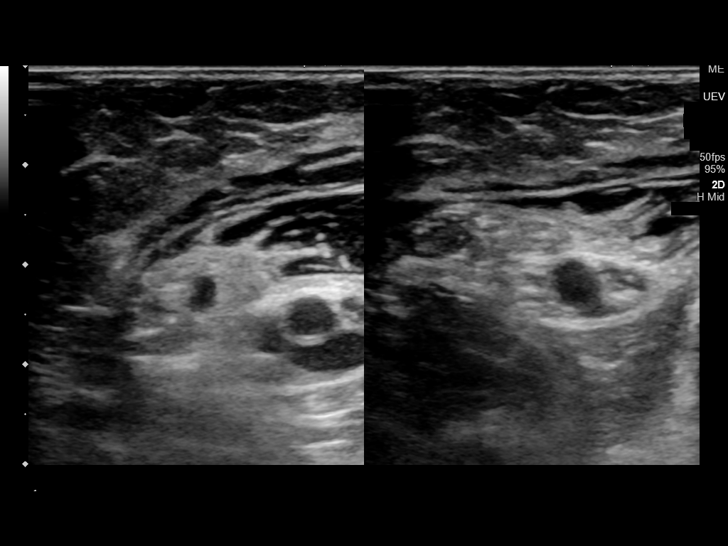
[im 20/31]
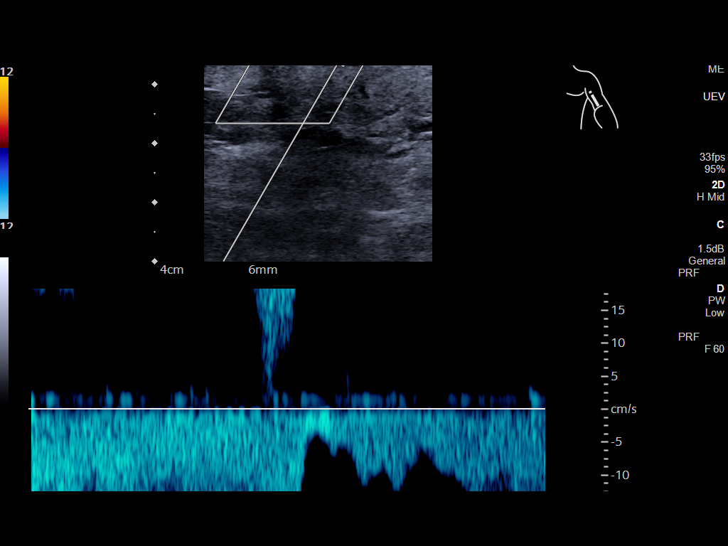
[im 23/31]
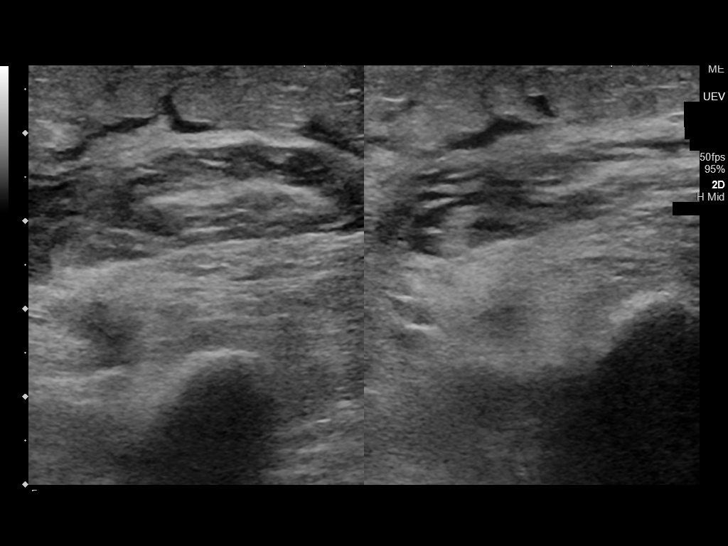
[im 25/31]
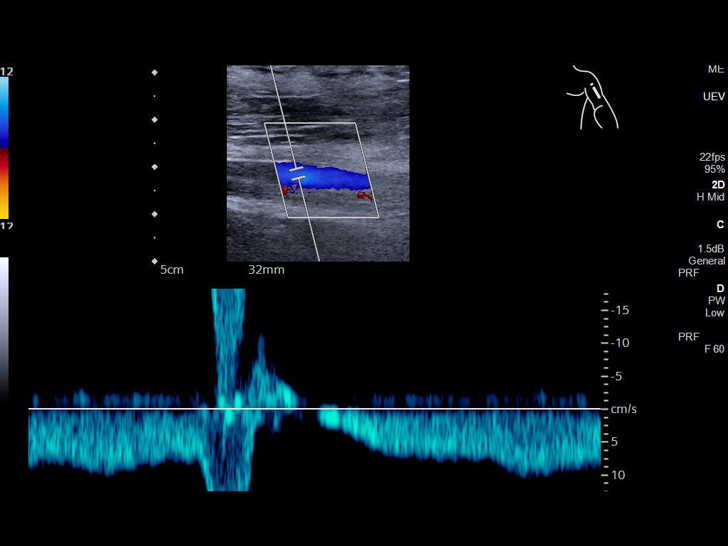
[im 28/31]
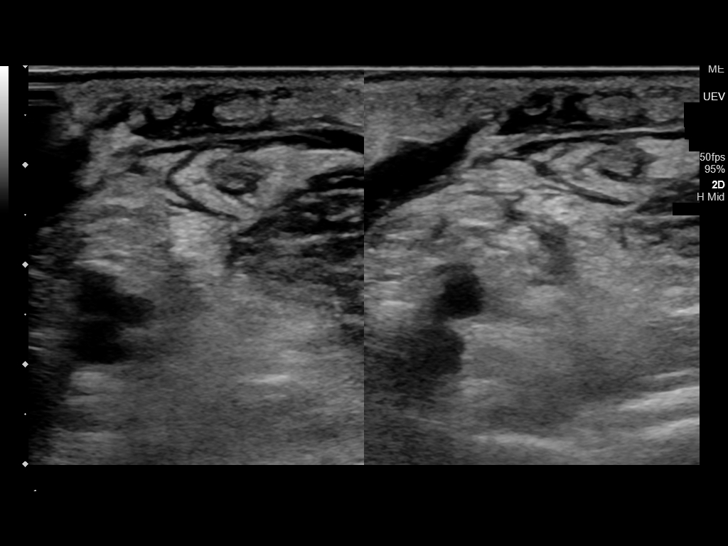
[im 31/31]
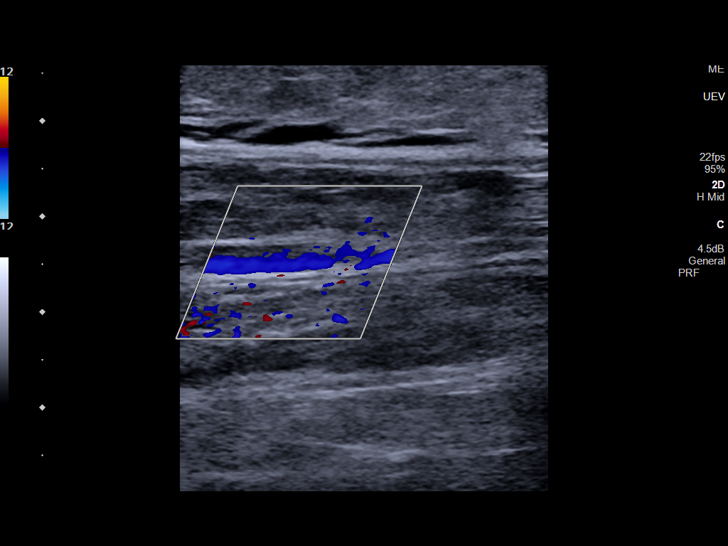

[13 of 24 positions shown; findings below may reference images not displayed]

FINDINGS: Contralateral Subclavian Vein: Respiratory phasicity is normal and
symmetric with the symptomatic side. No evidence of thrombus. Normal
compressibility.

Internal Jugular Vein: No evidence of thrombus. Normal
compressibility, respiratory phasicity and response to augmentation.

Subclavian Vein: No evidence of thrombus. Normal compressibility,
respiratory phasicity and response to augmentation.

Axillary Vein: No evidence of thrombus. Normal compressibility,
respiratory phasicity and response to augmentation.

Cephalic Vein: No evidence of thrombus. Normal compressibility,
respiratory phasicity and response to augmentation.

Basilic Vein: No evidence of thrombus. Normal compressibility,
respiratory phasicity and response to augmentation.

Brachial Veins: No evidence of thrombus. Normal compressibility,
respiratory phasicity and response to augmentation.

Radial Veins: No evidence of thrombus. Normal compressibility,
respiratory phasicity and response to augmentation.

Ulnar Veins: No evidence of thrombus. Normal compressibility,
respiratory phasicity and response to augmentation.

Venous Reflux:  None visualized.

Other Findings: There is a moderate amount of subcutaneous edema at
the level of the forearm.
IMPRESSION: No evidence of DVT within the left upper extremity.

## 2023-01-05 IMAGING — DX DG CHEST 1V PORT
1 series · 1 of 1 positions shown · non-contrast
Comparison: Chest x-ray 01/25/2021, chest x-ray 12/12/2020, CT
chest 07/24/2019

CLINICAL DATA: Hypoxia.

EXAM:
PORTABLE CHEST 1 VIEW

[chest ap]
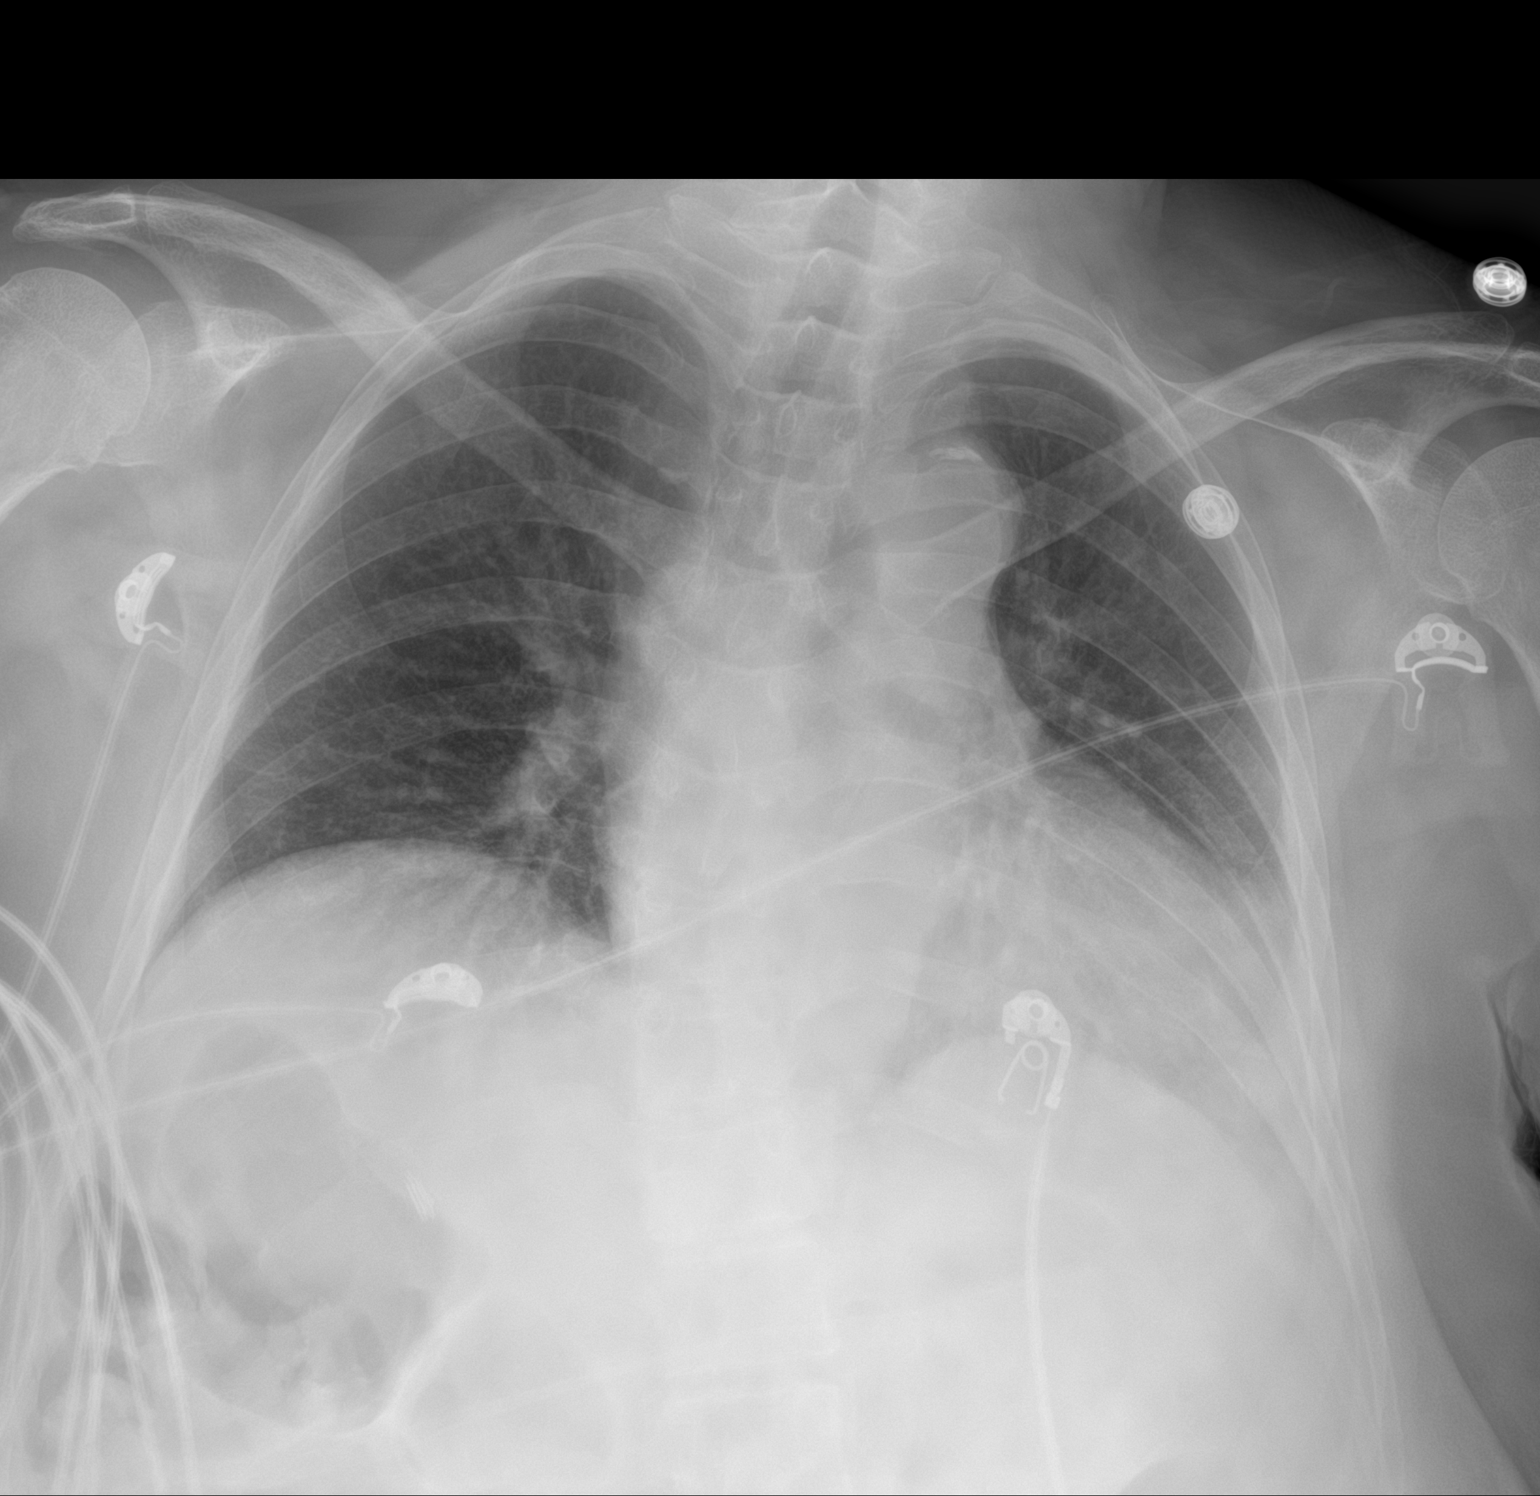

[1 of 1 positions shown; findings below may reference images not displayed]

FINDINGS: The heart size and mediastinal contours are unchanged. Aortic
calcification.

Patchy left lower lobe opacities. Similar appearing coarsened
interstitial markings with no overt pulmonary edema. Trace left
pleural effusion not excluded. No pneumothorax.

No acute osseous abnormality.
IMPRESSION: Patchy left lower lobe opacities and possible trace left pleural
effusion. Finding may represent atelectasis versus
infection/inflammation. Consider PA and lateral view of the chest
for further evaluation.

## 2023-01-05 IMAGING — CT CT HEAD CODE STROKE
2 series · 15 of 37 positions shown, 18 images · non-contrast
Comparison: 12/12/2020

CLINICAL DATA: Code stroke.  Acute neurologic deficit

EXAM:
CT HEAD WITHOUT CONTRAST
TECHNIQUE: Contiguous axial images were obtained from the base of the skull
through the vertex without intravenous contrast.

[Series 2: head 5.0 st · axial · 0.44mm/px · z∈[-199,-39]mm · 12 of 38 slices shown, 15 images]
[im 3/38  brain]
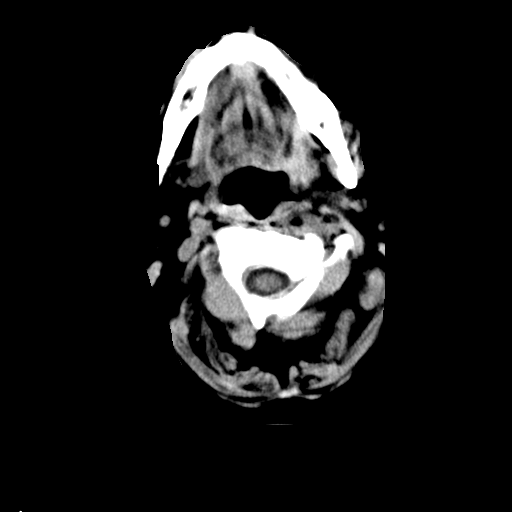
[im 3/38  bone]
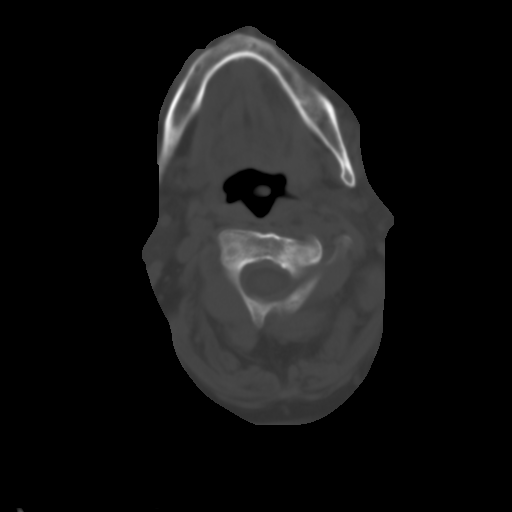
[im 6/38  brain]
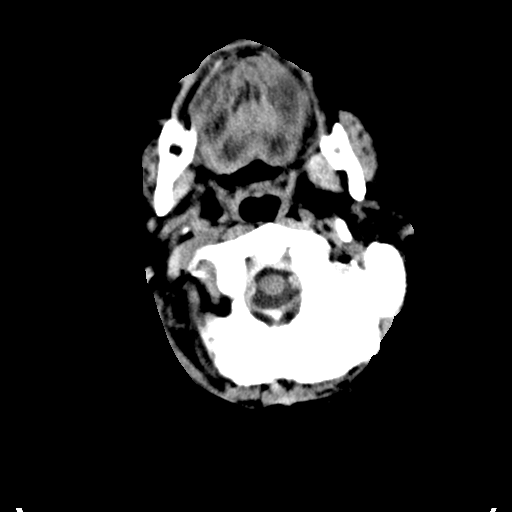
[im 8/38  brain]
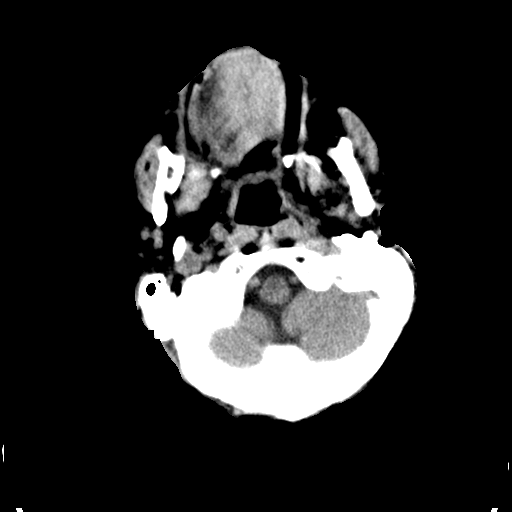
[im 12/38  brain]
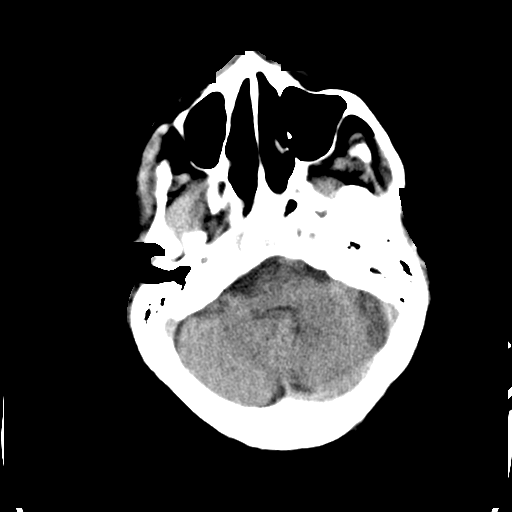
[im 15/38  brain]
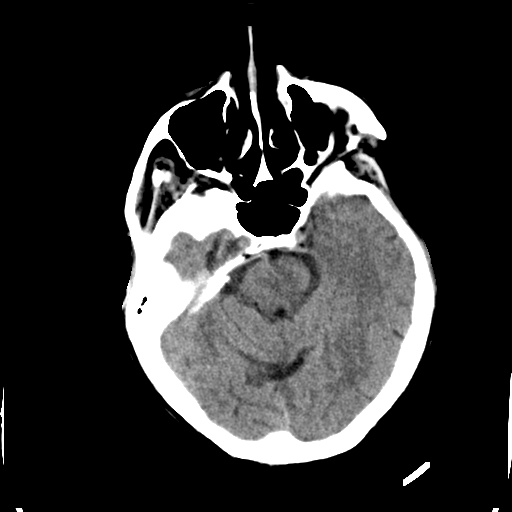
[im 15/38  bone]
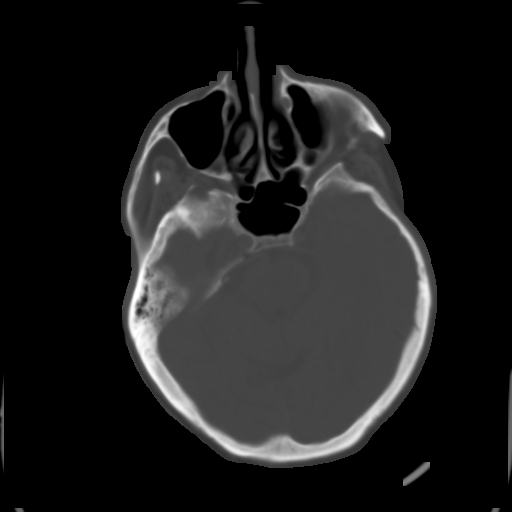
[im 17/38  brain]
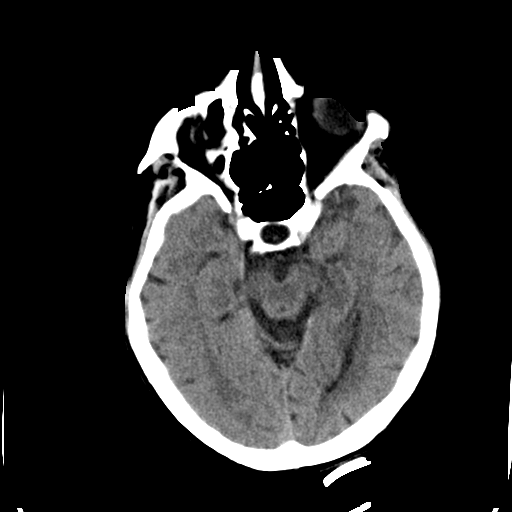
[im 21/38  brain]
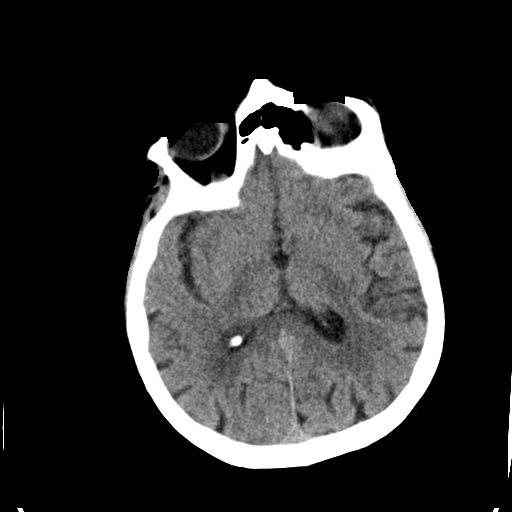
[im 23/38  brain]
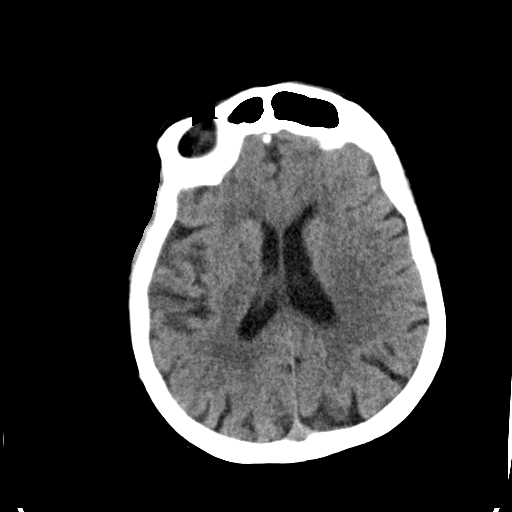
[im 26/38  brain]
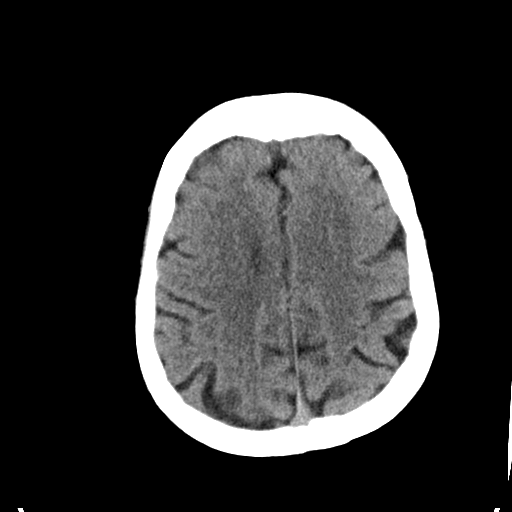
[im 26/38  bone]
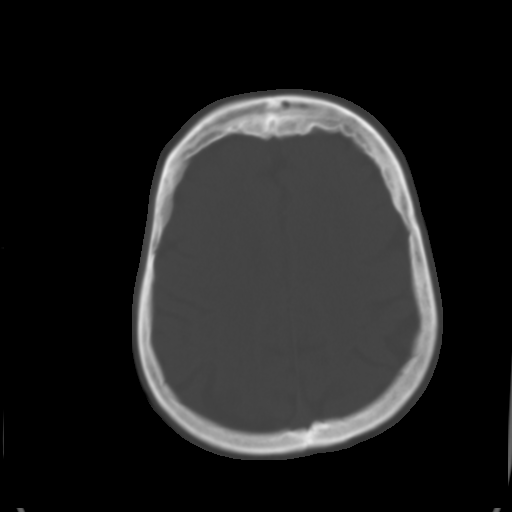
[im 30/38  brain]
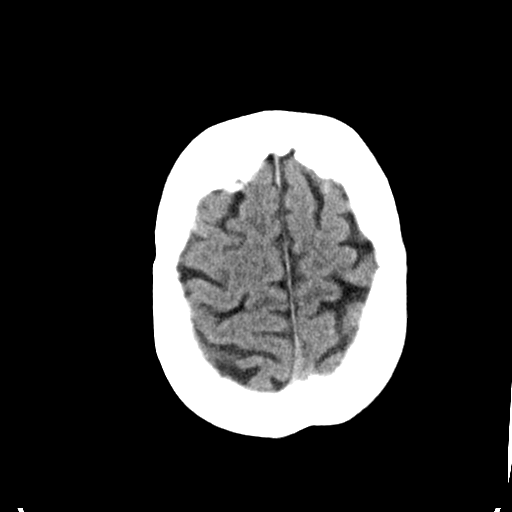
[im 32/38  brain]
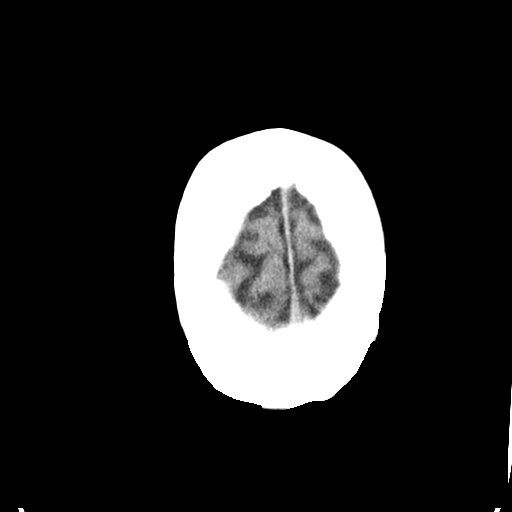
[im 35/38  brain]
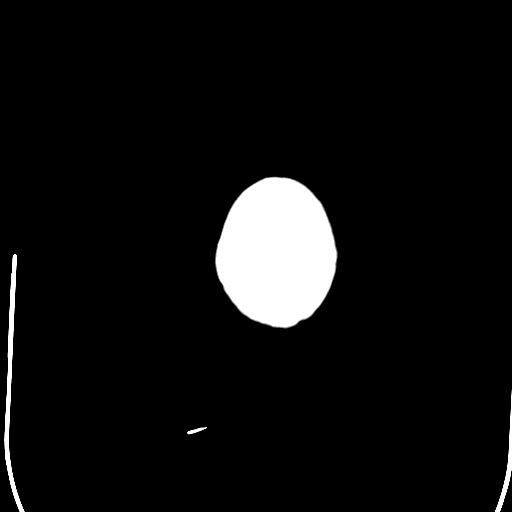

[Series 6: head 3.0 sag st · sagittal · 0.36mm/px · 3 of 65 slices shown]
[im 24/65  brain]
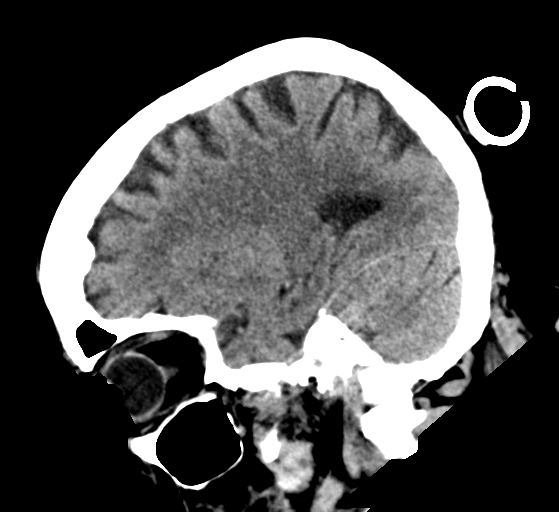
[im 33/65  brain]
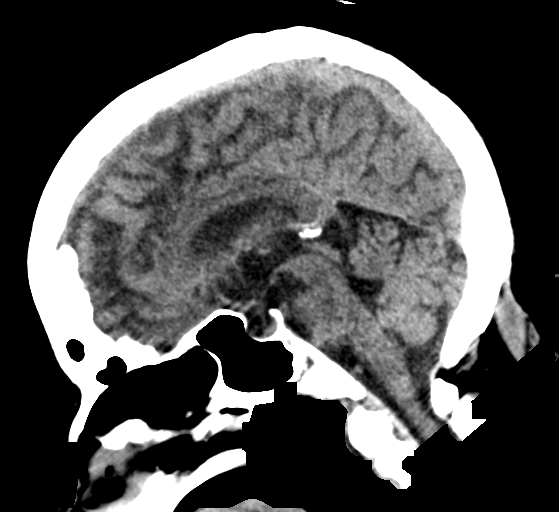
[im 42/65  brain]
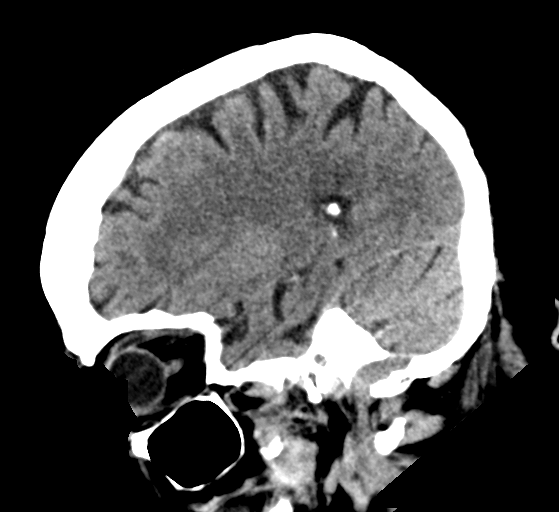

[15 of 37 positions shown; findings below may reference images not displayed]

FINDINGS: Brain: There is no mass, hemorrhage or extra-axial collection. The
size and configuration of the ventricles and extra-axial CSF spaces
are normal. There is hypoattenuation of the periventricular white
matter, most commonly indicating chronic ischemic microangiopathy.

Vascular: No abnormal hyperdensity of the major intracranial
arteries or dural venous sinuses. No intracranial atherosclerosis.

Skull: The visualized skull base, calvarium and extracranial soft
tissues are normal.

Sinuses/Orbits: No fluid levels or advanced mucosal thickening of
the visualized paranasal sinuses. No mastoid or middle ear effusion.
The orbits are normal.

ASPECTS (Alberta Stroke Program Early CT Score)

- Ganglionic level infarction (caudate, lentiform nuclei, internal
capsule, insula, M1-M3 cortex): 7

- Supraganglionic infarction (M4-M6 cortex): 3

Total score (0-10 with 10 being normal): 10
IMPRESSION: 1. Chronic ischemic microangiopathy without acute intracranial
abnormality.
2. ASPECTS is 10.
3. These results were communicated to Dr. Rajan Sabrina at [DATE] on
02/11/2021 by text page via the [employer] messaging system.

## 2023-01-06 IMAGING — DX DG CHEST 1V PORT
1 series · 1 of 1 positions shown · non-contrast
Comparison: Portable exam 1774 hours compared to 02/11/2021

CLINICAL DATA: Shortness of breath

EXAM:
PORTABLE CHEST 1 VIEW

[chest ap]
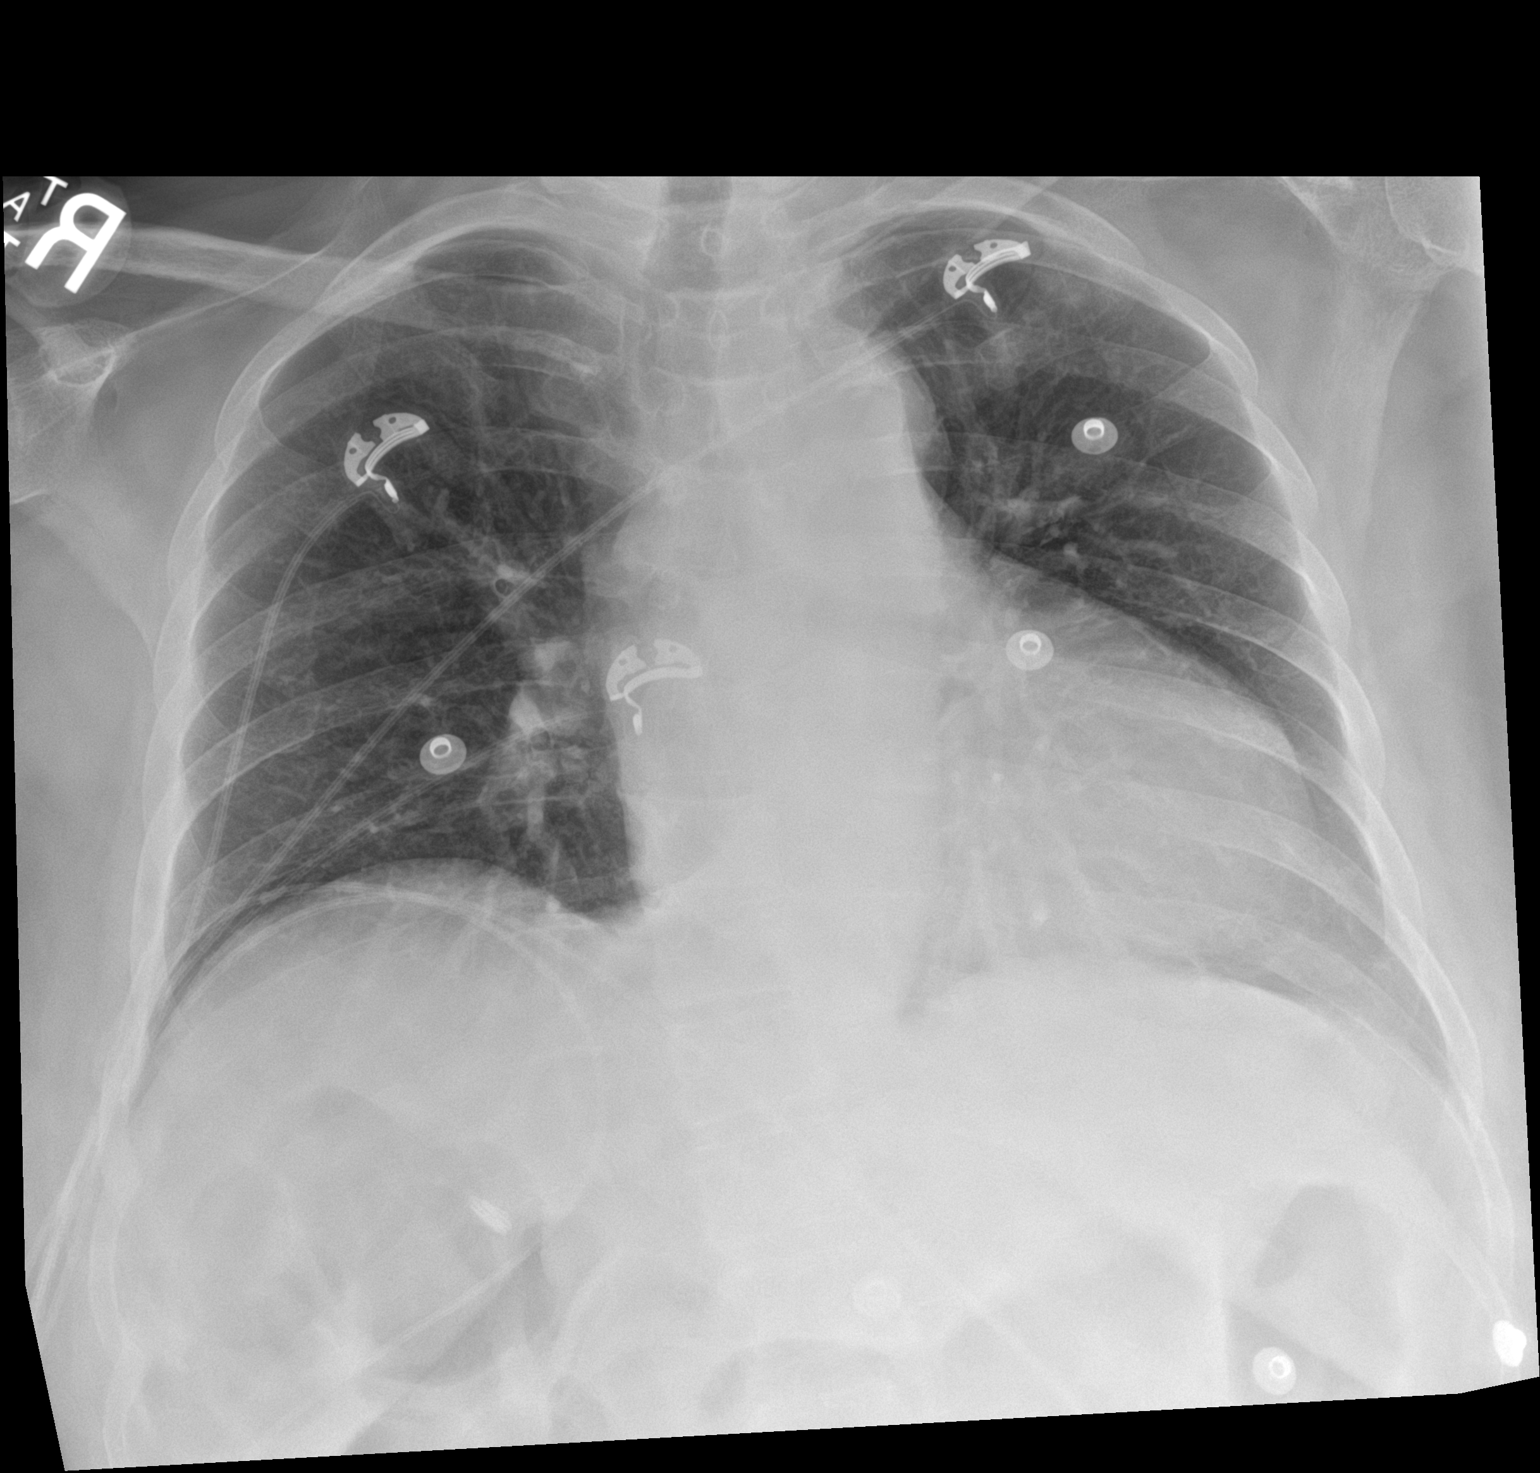

[1 of 1 positions shown; findings below may reference images not displayed]

FINDINGS: Enlargement of cardiac silhouette.

Mediastinal contours and pulmonary vascularity normal.

Peribronchial thickening with minimal LEFT basilar atelectasis.

Remaining lungs clear.

No acute infiltrate, pleural effusion or pneumothorax.

Surgical clips RIGHT upper quadrant question cholecystectomy.

No acute osseous findings.
IMPRESSION: Bronchitic changes with minimal LEFT basilar atelectasis.

Enlargement of cardiac silhouette.

## 2023-01-06 IMAGING — MR MR HEAD W/O CM
12 of 13 series · 43 of 48 positions shown · non-contrast
Comparison: Head CT yesterday.

CLINICAL DATA: Altered mental status. Unresponsive with gaze
deviation.

EXAM:
MRI HEAD WITHOUT CONTRAST
TECHNIQUE: Multiplanar, multiecho pulse sequences of the brain and surrounding
structures were obtained without intravenous contrast.

[Series 5: DWI · axial · 3.0mm · 0.88mm/px · z∈[-89,+51]mm · 6 of 96 slices shown (1 of 4)]
[im 1/96]
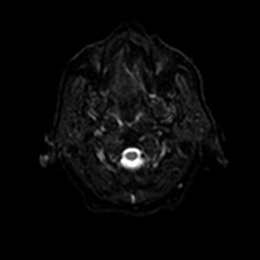
[im 20/96]
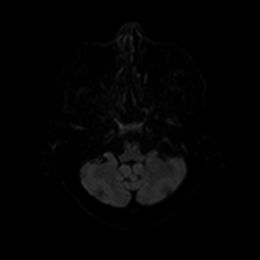
[im 39/96]
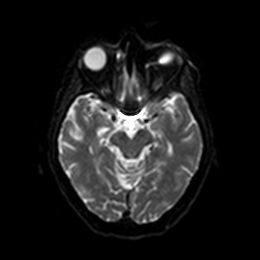
[im 58/96]
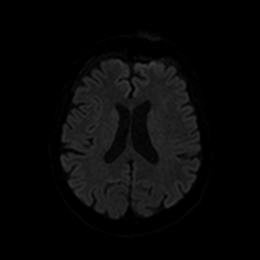
[im 77/96]
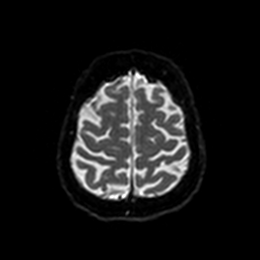
[im 96/96]
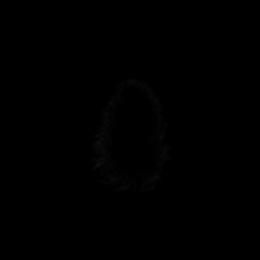

[Series 6: DWI · axial · 3.0mm · 0.88mm/px · z∈[-89,+51]mm · 4 of 48 slices shown (2 of 4)]
[im 1/48]
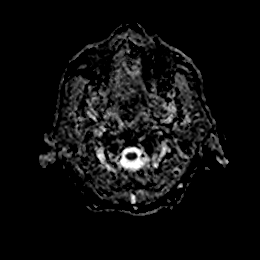
[im 16/48]
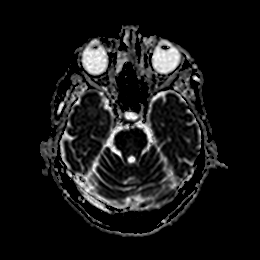
[im 32/48]
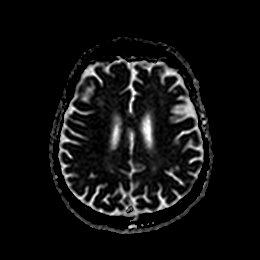
[im 48/48]
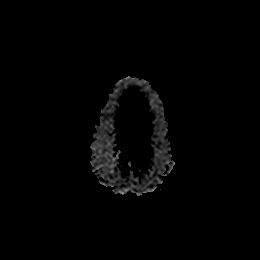

[Series 7: DWI · coronal · 4.0mm · 0.88mm/px · 5 of 64 slices shown (3 of 4)]
[im 1/64]
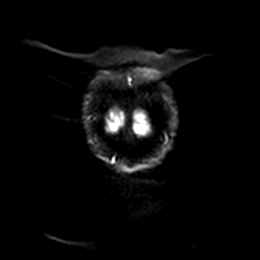
[im 16/64]
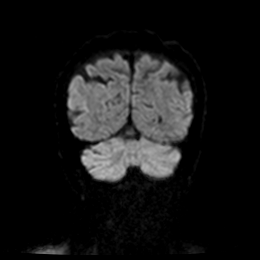
[im 32/64]
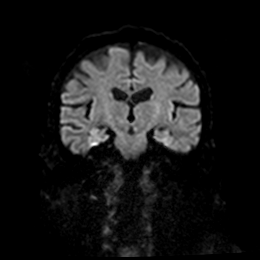
[im 48/64]
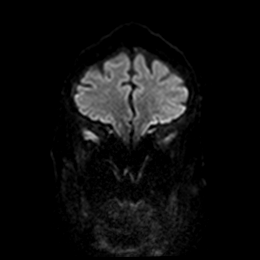
[im 64/64]
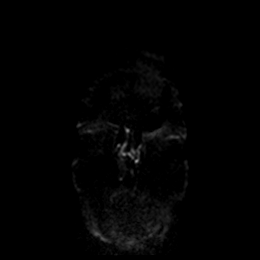

[Series 8: DWI · coronal · 4.0mm · 0.88mm/px · 2 of 32 slices shown (4 of 4)]
[im 1/32]
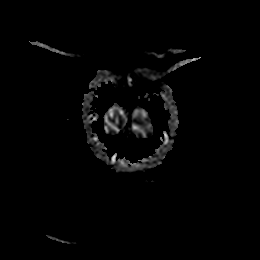
[im 32/32]
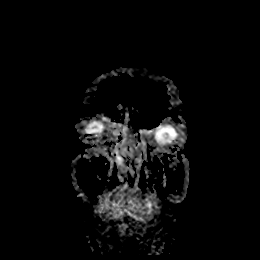

[Series 9: T1 · sagittal · 5.0mm · 0.75mm/px · 2 of 23 slices shown]
[im 1/23]
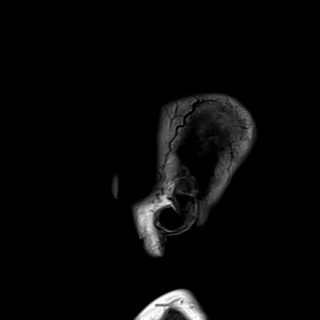
[im 23/23]
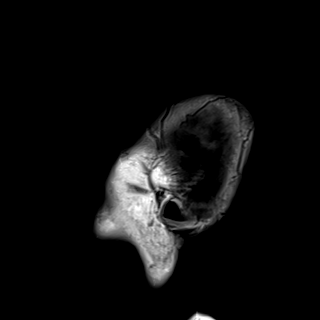

[Series 10: T2 · axial · 5.0mm · 0.72mm/px · z∈[-89,+54]mm · 2 of 25 slices shown (1 of 2)]
[im 1/25]
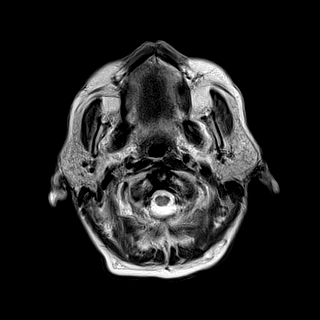
[im 25/25]
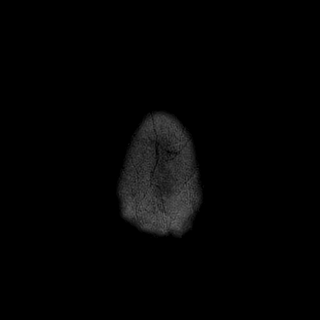

[Series 11: FLAIR · axial · 5.0mm · 0.45mm/px · z∈[-89,+54]mm · 2 of 25 slices shown]
[im 1/25]
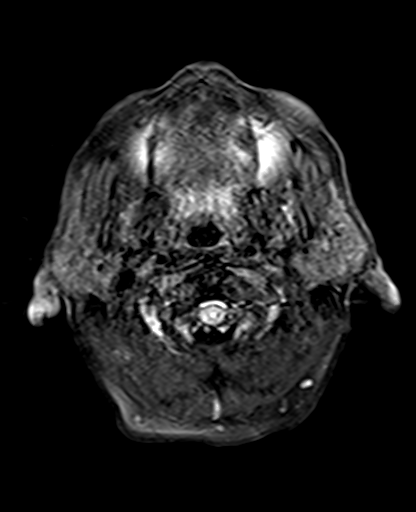
[im 25/25]
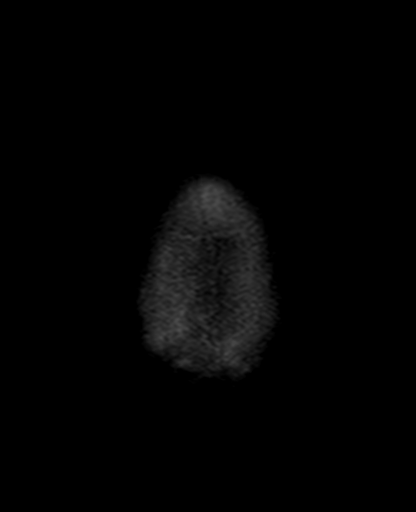

[Series 12: mag_images · axial · 3.0mm · 0.90mm/px · z∈[-107,+69]mm · 5 of 60 slices shown]
[im 1/60]
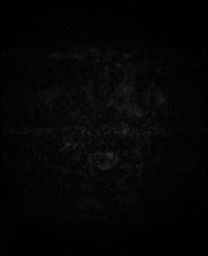
[im 15/60]
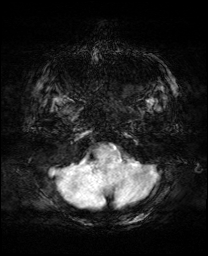
[im 30/60]
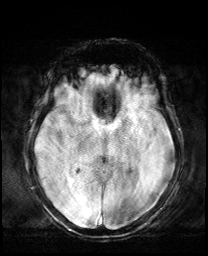
[im 45/60]
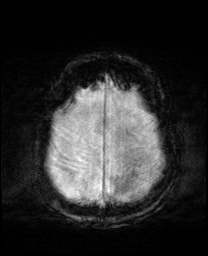
[im 60/60]
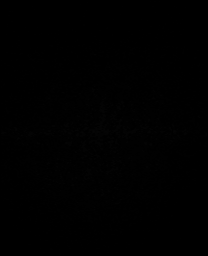

[Series 13: pha_images · axial · 3.0mm · 0.90mm/px · z∈[-101,+66]mm · 4 of 51 slices shown]
[im 1/51]
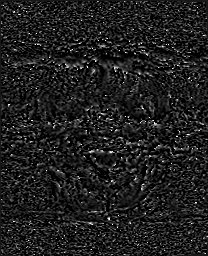
[im 17/51]
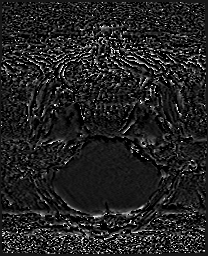
[im 34/51]
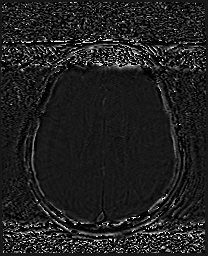
[im 51/51]
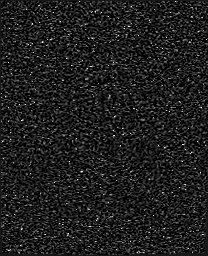

[Series 14: swi_images · axial · 3.0mm · 0.90mm/px · z∈[-107,+69]mm · 5 of 60 slices shown]
[im 1/60]
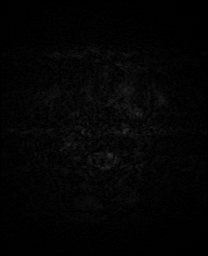
[im 15/60]
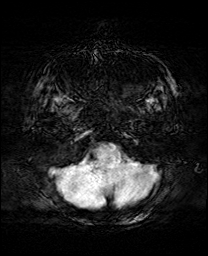
[im 30/60]
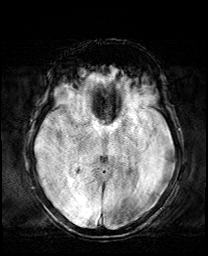
[im 45/60]
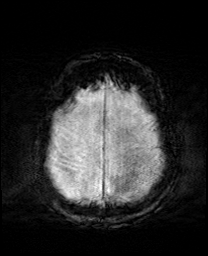
[im 60/60]
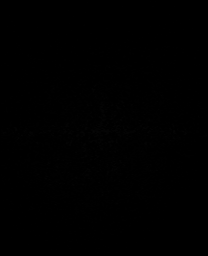

[Series 15: mip_images(sw) · axial · 24.0mm · 0.90mm/px · z∈[-97,+58]mm · 4 of 53 slices shown]
[im 1/53]
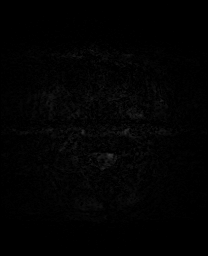
[im 18/53]
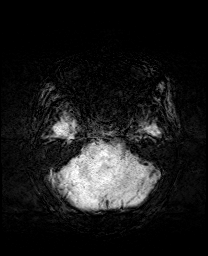
[im 35/53]
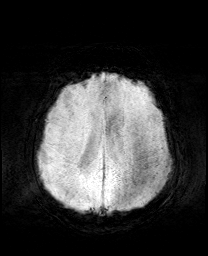
[im 53/53]
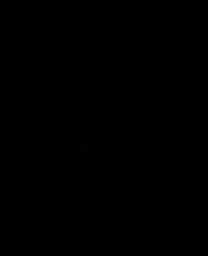

[Series 17: T2 · coronal · 5.0mm · 0.34mm/px · 2 of 29 slices shown (2 of 2)]
[im 1/29]
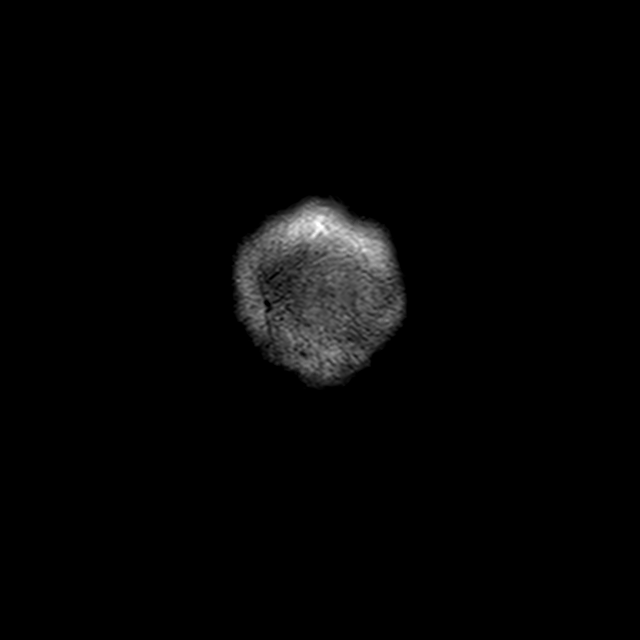
[im 29/29]
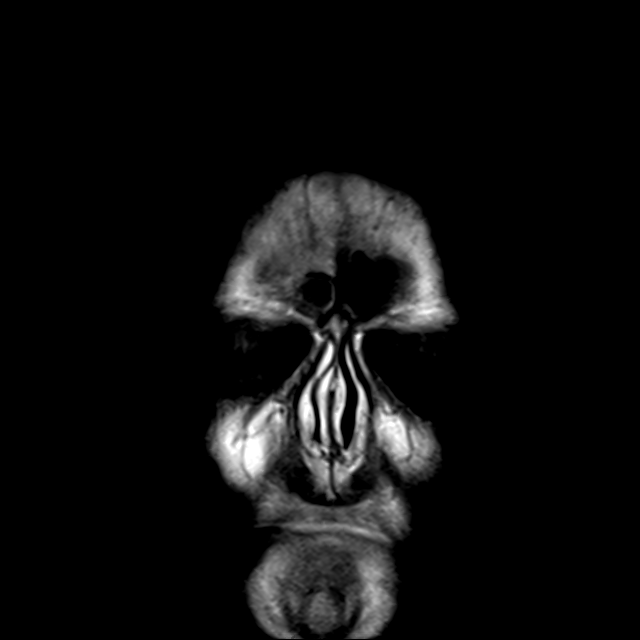

[43 of 48 positions shown; findings below may reference images not displayed]

FINDINGS: Brain: Diffusion imaging does not show any acute or subacute
infarction. Chronic small-vessel ischemic changes are present within
the pons. Few old small vessel cerebellar infarctions on the right.
Cerebral hemispheres show mild chronic small-vessel ischemic changes
of the white matter. No large vessel territory infarction. No mass
lesion, hemorrhage, hydrocephalus or extra-axial collection.

Vascular: Major vessels at the base of the brain show flow.

Skull and upper cervical spine: Negative

Sinuses/Orbits: Paranasal sinuses show some opacification in the
right ethmoid region. Orbits are negative.

Other: Bilateral mastoid effusions, left more extensive than right
IMPRESSION: No acute or subacute infarction. Chronic small-vessel ischemic
changes of the pons and cerebral hemispheric white matter.

Right ethmoid sinus opacification. Mastoid effusions, left more
extensive than right.

## 2023-01-16 IMAGING — CT CT HEAD W/O CM
3 series · 16 of 47 positions shown, 19 images · non-contrast
Comparison: MRI 02/12/2021, CT brain 02/11/2021

CLINICAL DATA: Hypoglycemia altered mental status

EXAM:
CT HEAD WITHOUT CONTRAST
TECHNIQUE: Contiguous axial images were obtained from the base of the skull
through the vertex without intravenous contrast.

[Series 2: head wo · axial · 0.42mm/px · z∈[+375,+500]mm · 10 of 31 slices shown, 13 images]
[im 3/31  brain]
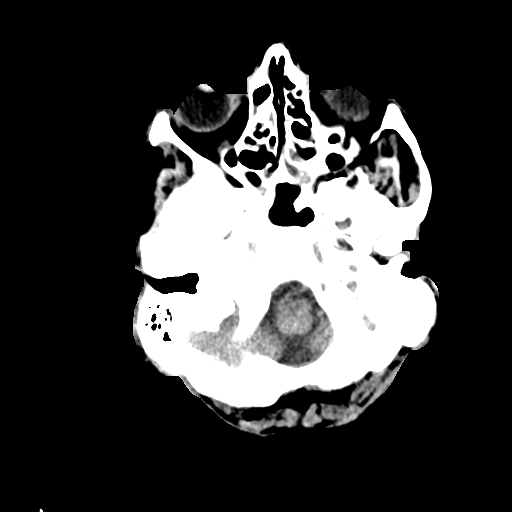
[im 3/31  bone]
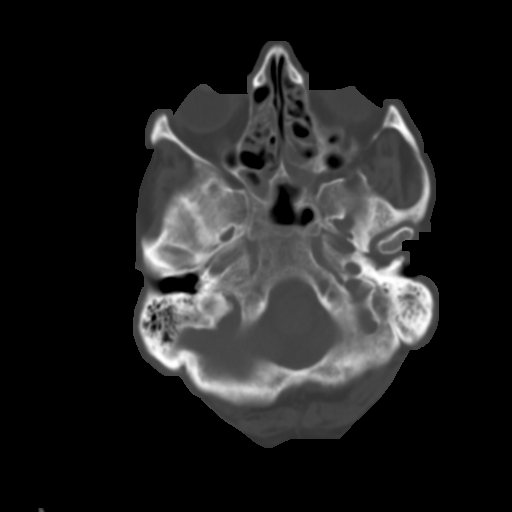
[im 6/31  brain]
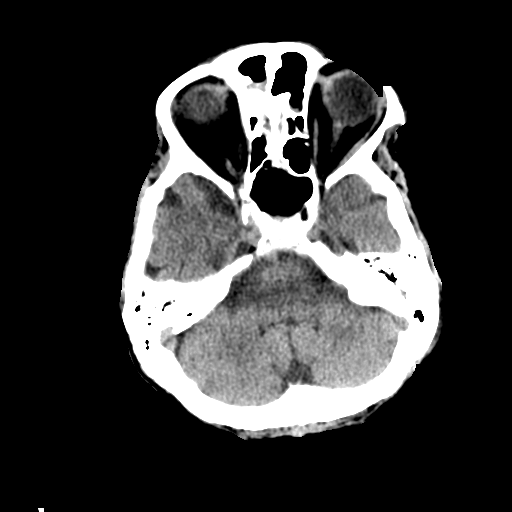
[im 9/31  brain]
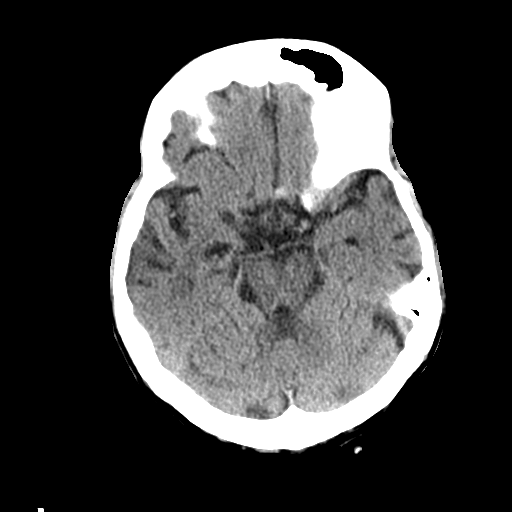
[im 11/31  brain]
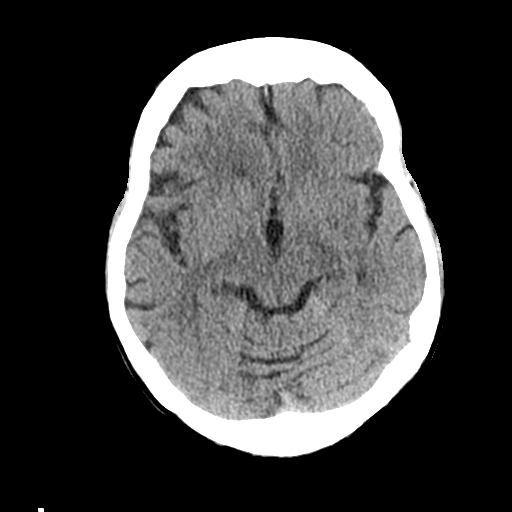
[im 14/31  brain]
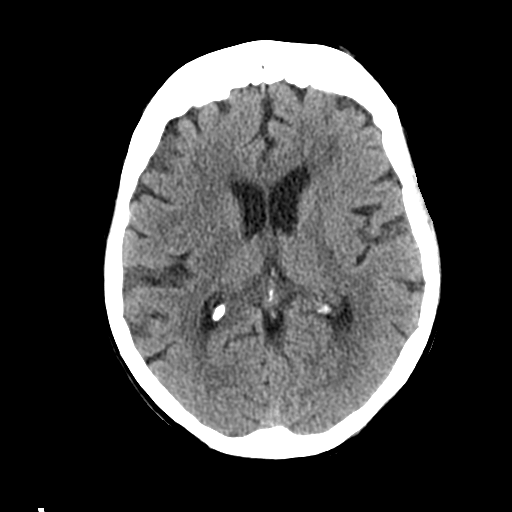
[im 14/31  bone]
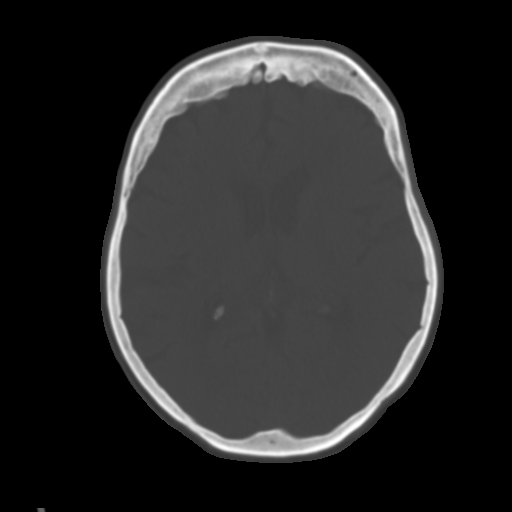
[im 17/31  brain]
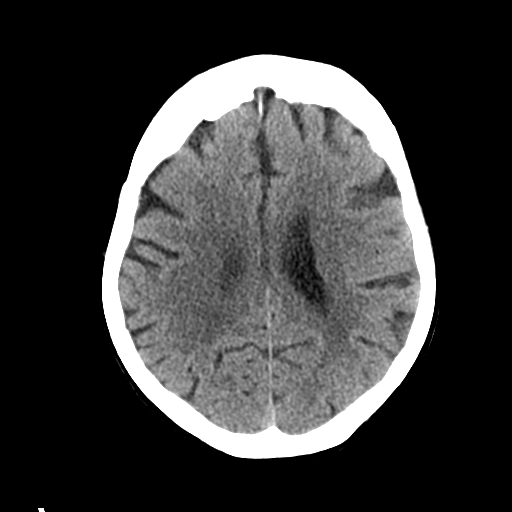
[im 20/31  brain]
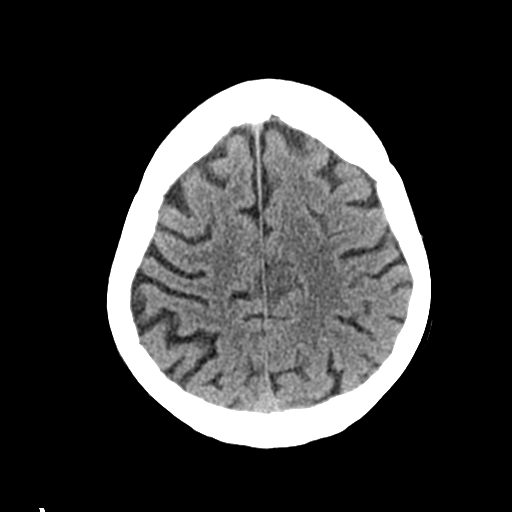
[im 23/31  brain]
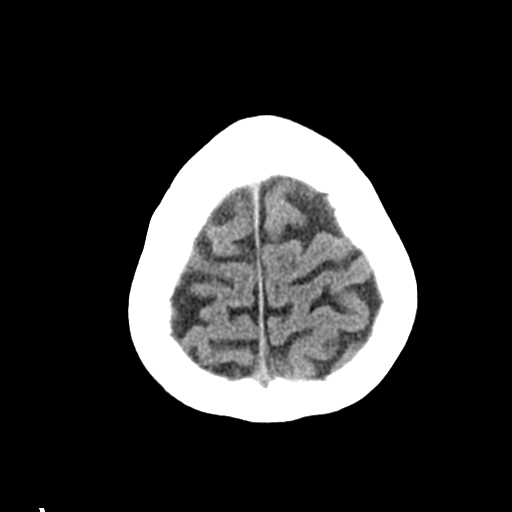
[im 25/31  brain]
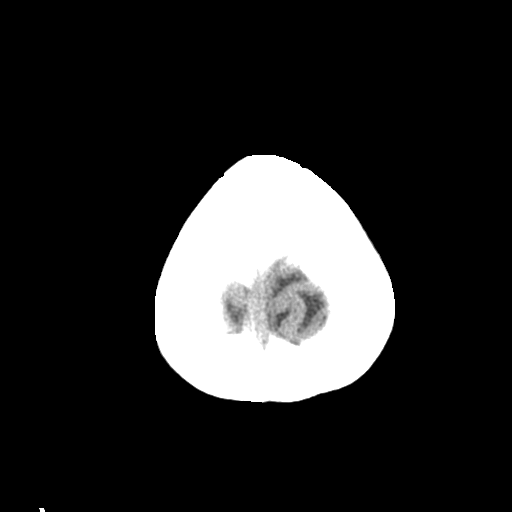
[im 25/31  bone]
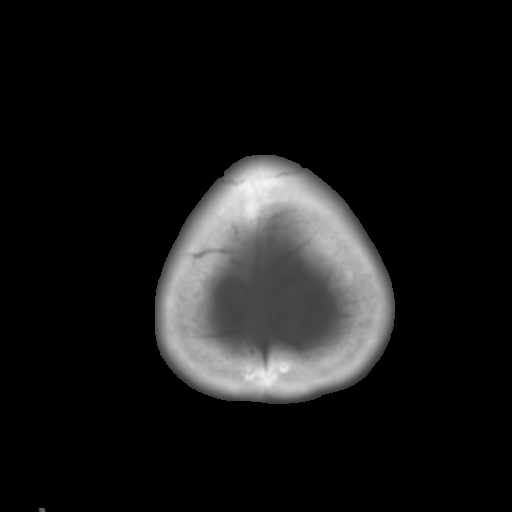
[im 28/31  brain]
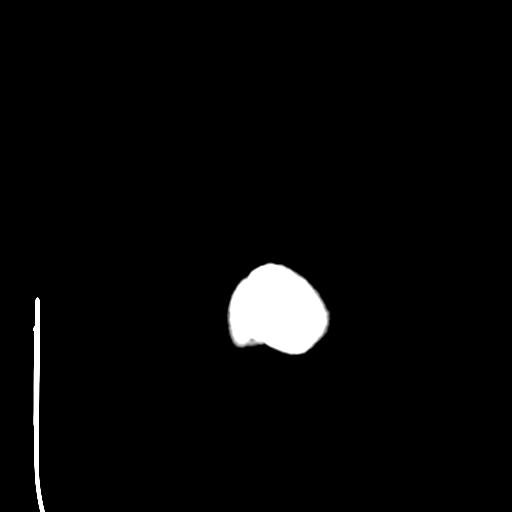

[Series 4: coronal soft tissue · coronal · 0.32mm/px · 3 of 60 slices shown]
[im 20/60  brain]
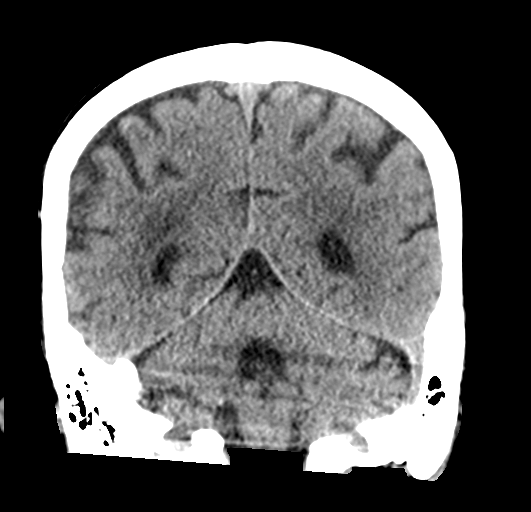
[im 27/60  brain]
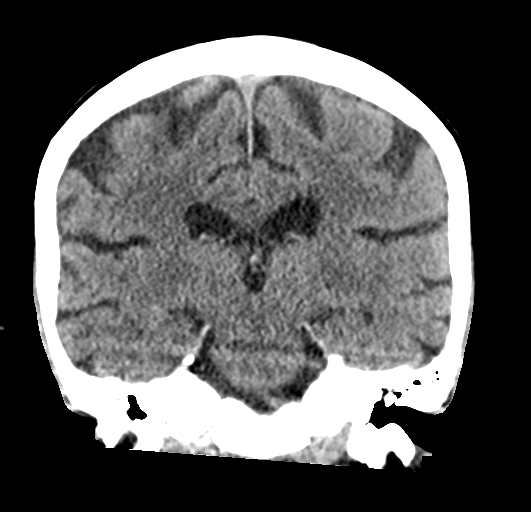
[im 33/60  brain]
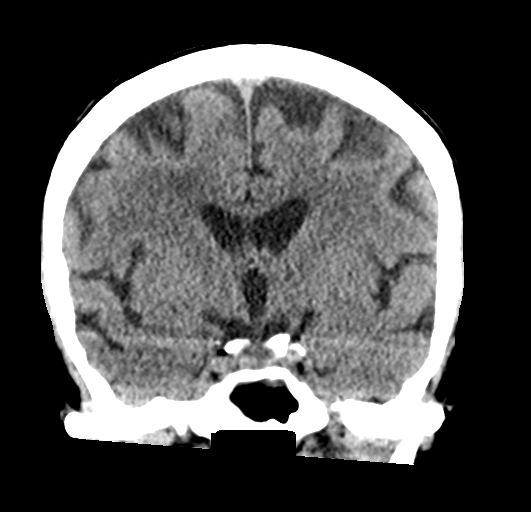

[Series 5: sagittal soft tissue · sagittal · 0.31mm/px · 3 of 49 slices shown]
[im 17/49  brain]
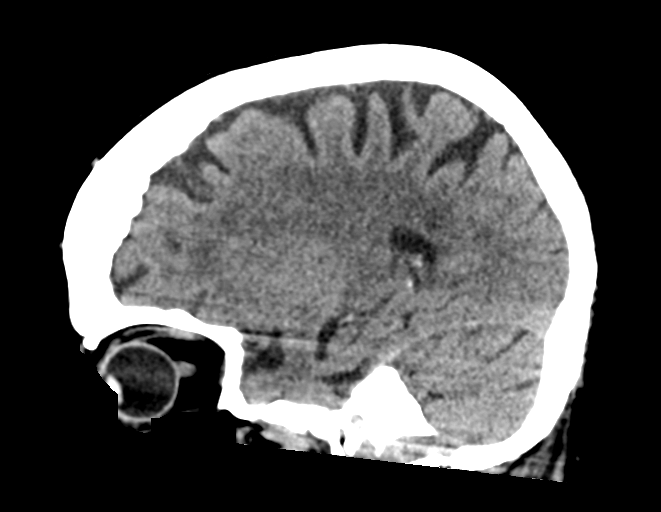
[im 25/49  brain]
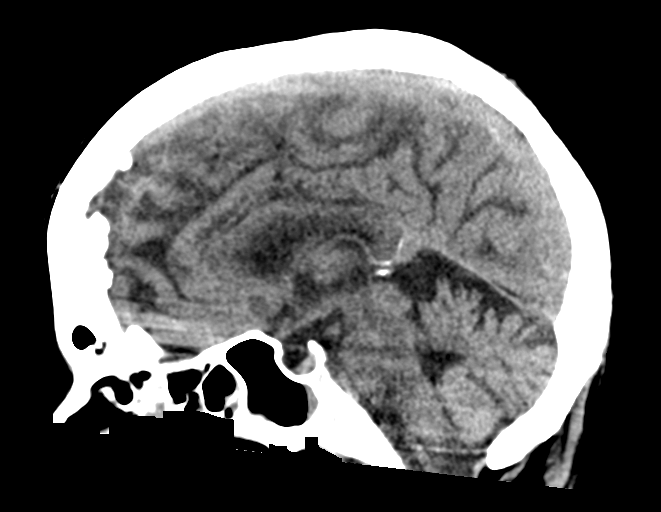
[im 33/49  brain]
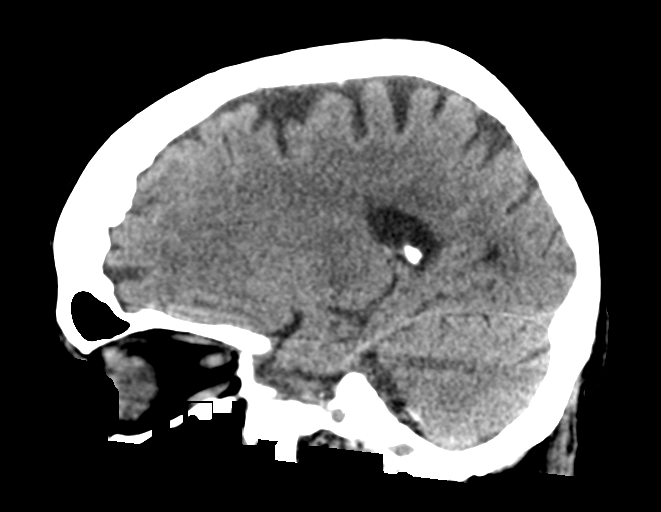

[16 of 47 positions shown; findings below may reference images not displayed]

FINDINGS: Brain: No acute territorial infarction, hemorrhage, or intracranial
mass. Atrophy and chronic small vessel ischemic changes of the white
matter. Stable ventricle size.

Vascular: No hyperdense vessels.  Carotid vascular calcification

Skull: Normal. Negative for fracture or focal lesion.

Sinuses/Orbits: Mucosal thickening in the sphenoid and ethmoid
sinuses. Fluid in the sphenoid sinus. Left greater than right
mastoid effusion

Other: None
IMPRESSION: 1. No CT evidence for acute intracranial abnormality. Atrophy and
chronic small vessel ischemic changes of the white matter
2. Sinusitis.  Bilateral mastoid effusions.

## 2023-01-16 IMAGING — CR DG CHEST 2V
2 series · 2 of 2 positions shown · non-contrast
Comparison: 02/12/2021

CLINICAL DATA: Chest pain.  Hypoglycemia.  Unresponsive.

EXAM:
CHEST - 2 VIEW

[chest lat]
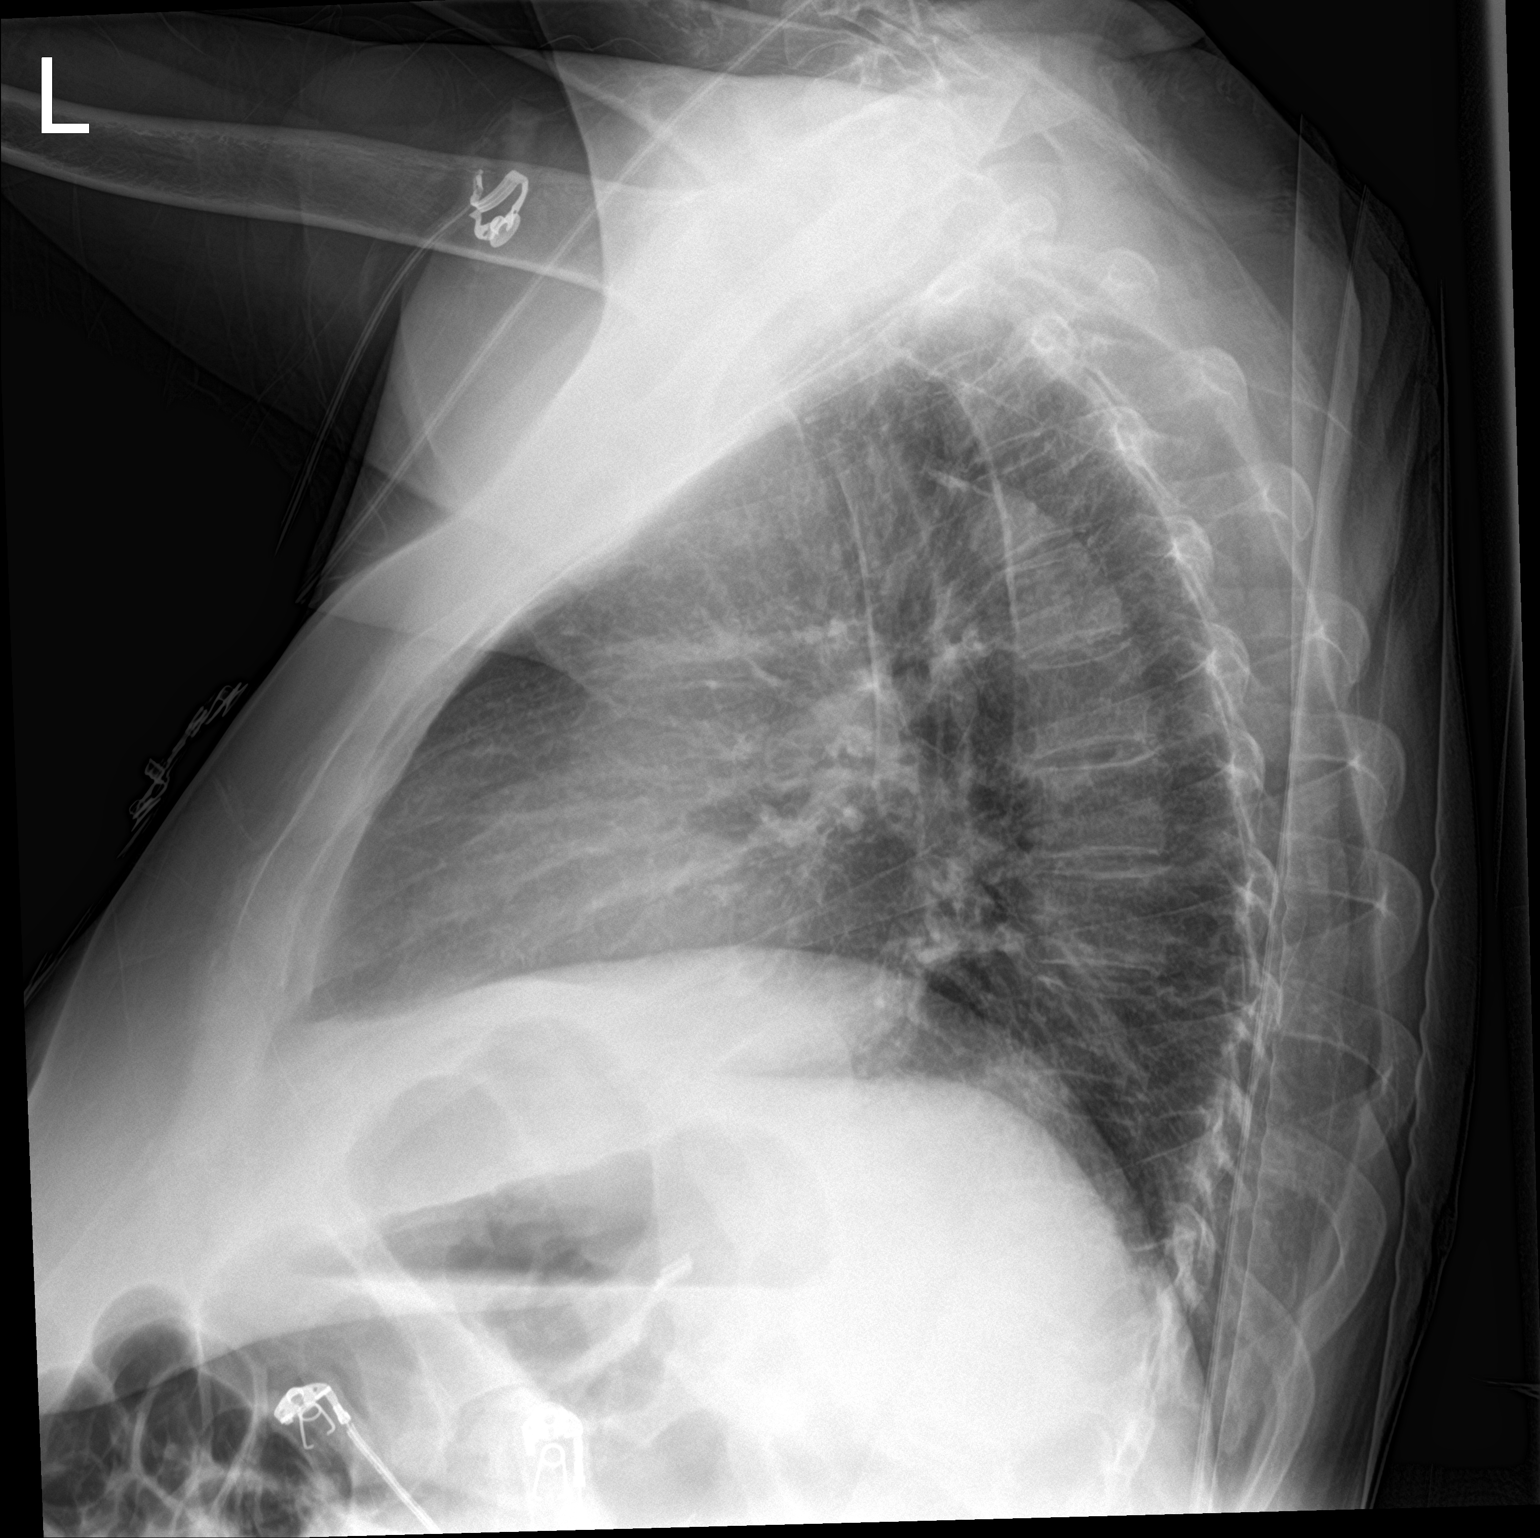

[chest ap]
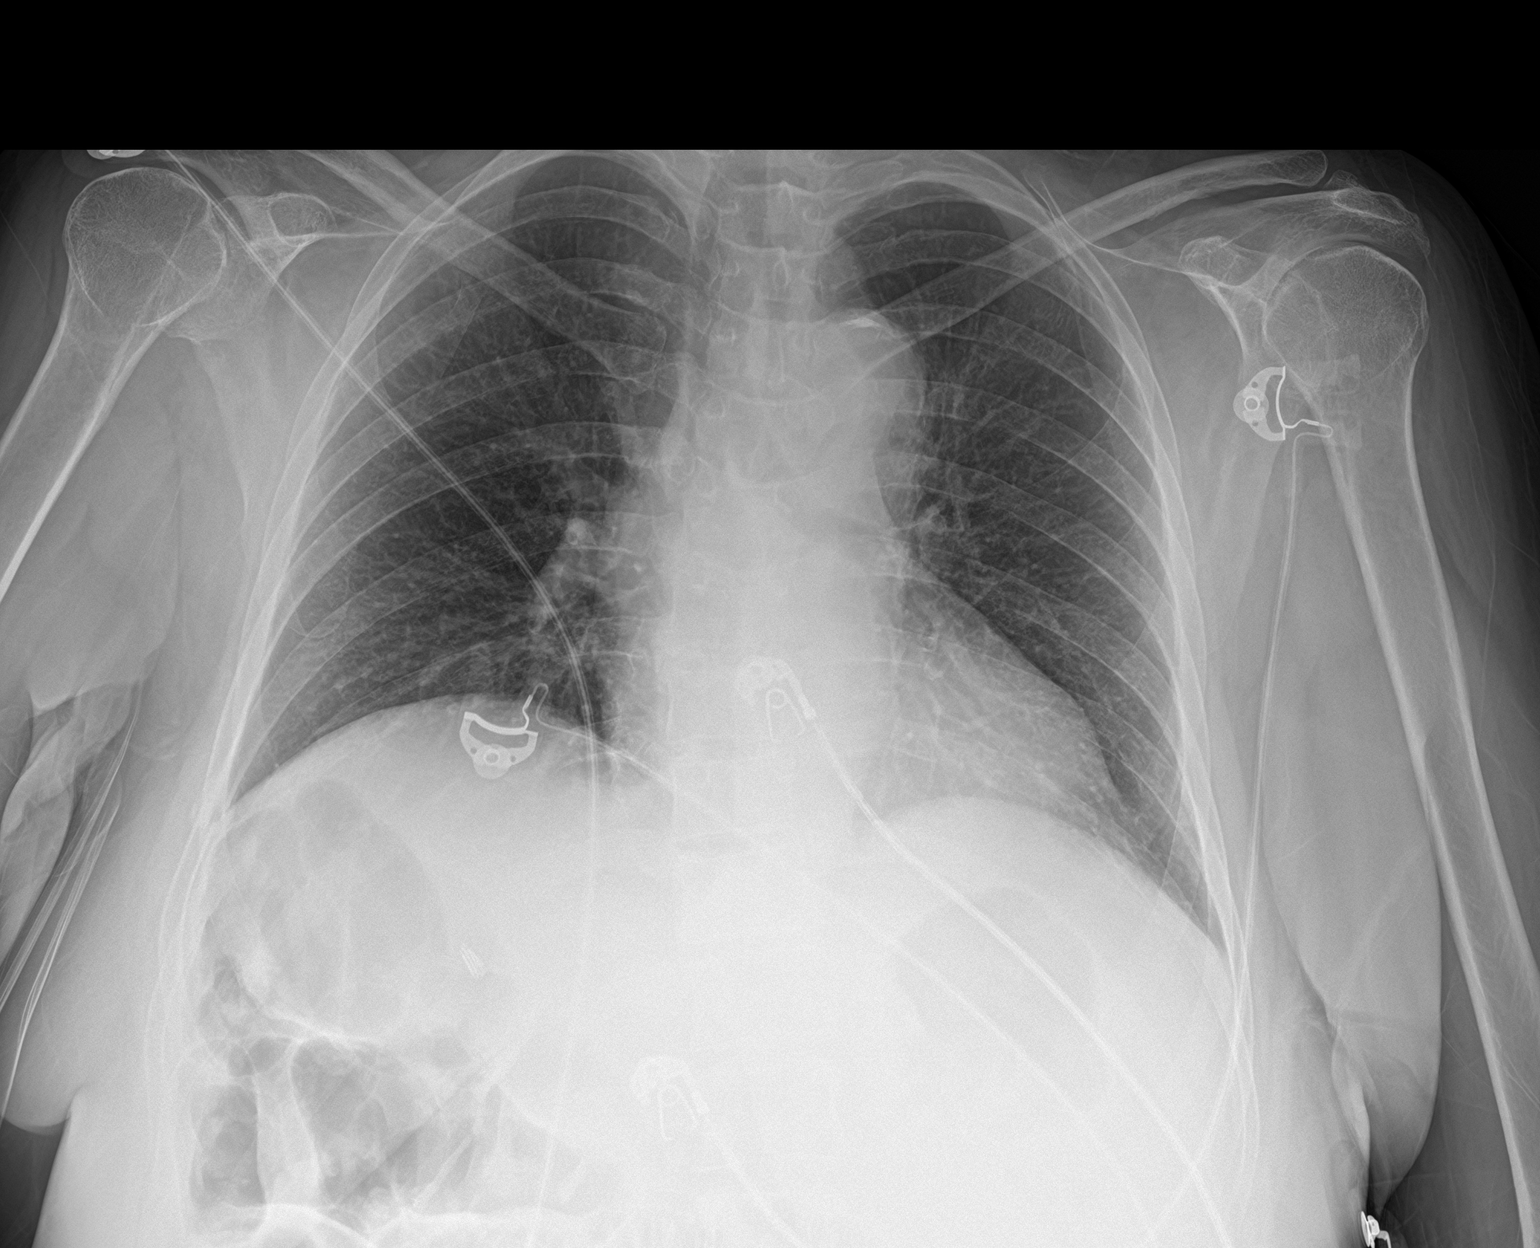

[2 of 2 positions shown; findings below may reference images not displayed]

FINDINGS: Heart size and pulmonary vascularity are normal. Lungs are clear.
Calcification of the aorta. Degenerative changes in the spine and
shoulders. No pleural effusions. No pneumothorax. Mediastinal
contours appear intact.
IMPRESSION: No active cardiopulmonary disease.

## 2023-01-18 IMAGING — DX DG CHEST 1V PORT
1 series · 1 of 1 positions shown · non-contrast
Comparison: CT 02/22/2021, radiograph 02/22/2021, 02/12/2021

CLINICAL DATA: Wheezing

EXAM:
PORTABLE CHEST 1 VIEW

[chest ap]
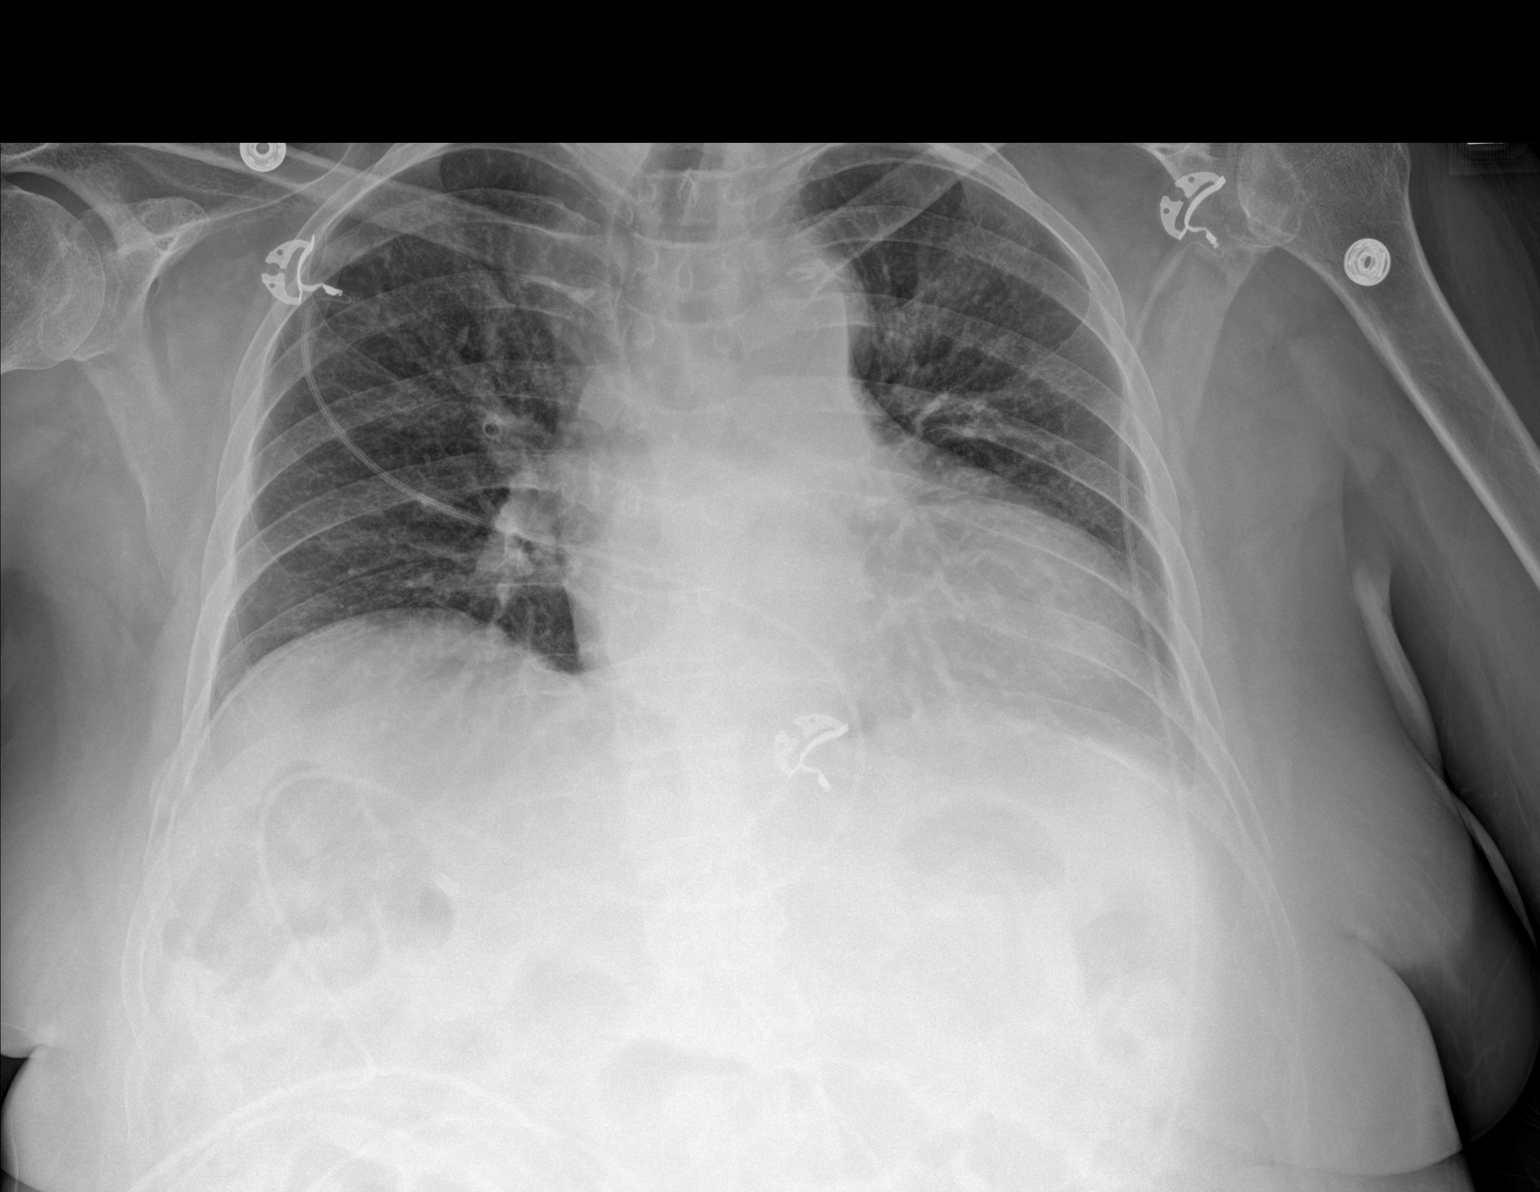

[1 of 1 positions shown; findings below may reference images not displayed]

FINDINGS: Mild central airways thickening. No consolidation or effusion. Mild
cardiomegaly. No pneumothorax. Low lung volumes.
IMPRESSION: 1. Mild cardiomegaly without edema or sizeable effusion
2. Low lung volumes.  Suspect mild central airways thickening

## 2023-01-18 IMAGING — MR MR HEAD W/O CM
12 series · 45 of 48 positions shown · non-contrast
Comparison: 02/12/2021

CLINICAL DATA: Delirium

EXAM:
MRI HEAD WITHOUT CONTRAST
TECHNIQUE: Multiplanar, multiecho pulse sequences of the brain and surrounding
structures were obtained without intravenous contrast.

[Series 5: ax dwi_tracew · axial · 3.0mm · 0.65mm/px · z∈[-94,+47]mm · 2 of 44 slices shown]
[im 1/44]
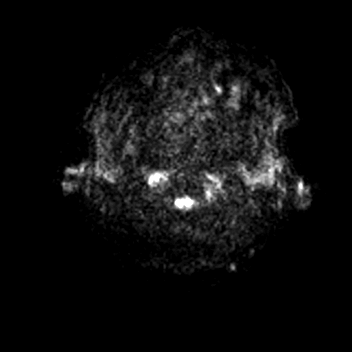
[im 44/44]
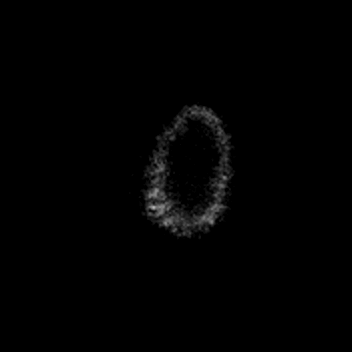

[Series 6: ax dwi_adc · axial · 3.0mm · 0.65mm/px · z∈[-94,+47]mm · 3 of 44 slices shown]
[im 1/44]
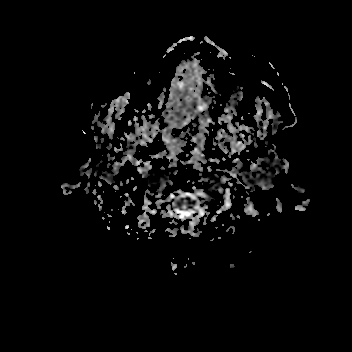
[im 22/44]
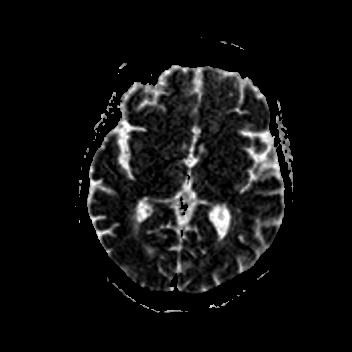
[im 44/44]
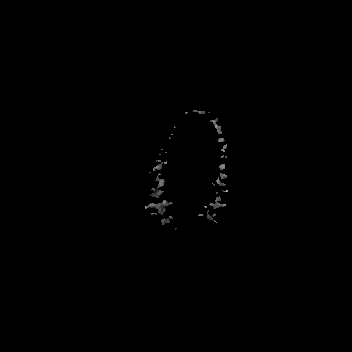

[Series 7: cor dwi_tracew · coronal · 5.0mm · 0.60mm/px · 3 of 34 slices shown]
[im 1/34]
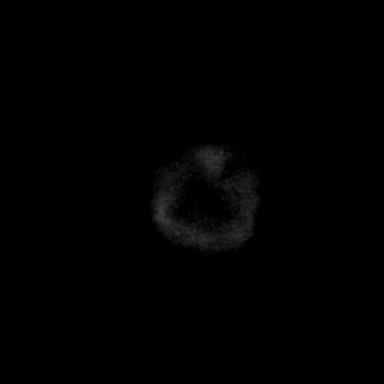
[im 17/34]
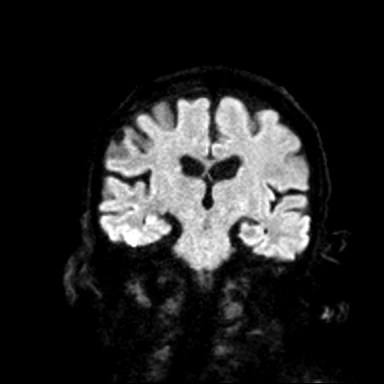
[im 34/34]
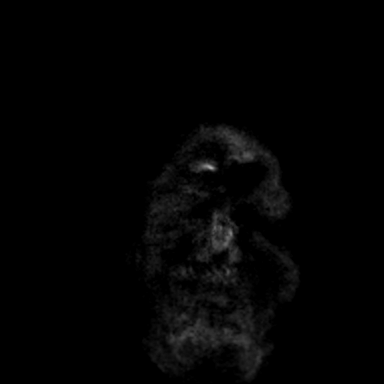

[Series 8: cor dwi_adc · coronal · 5.0mm · 0.60mm/px · 3 of 34 slices shown]
[im 1/34]
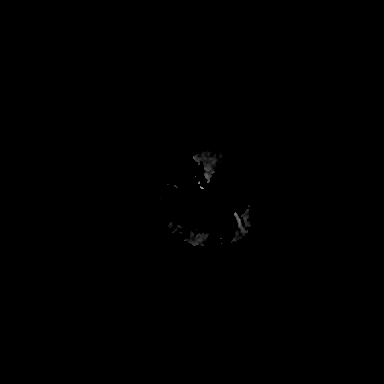
[im 17/34]
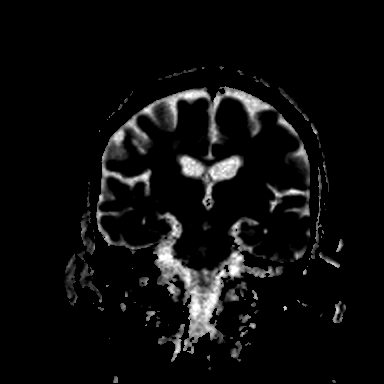
[im 34/34]
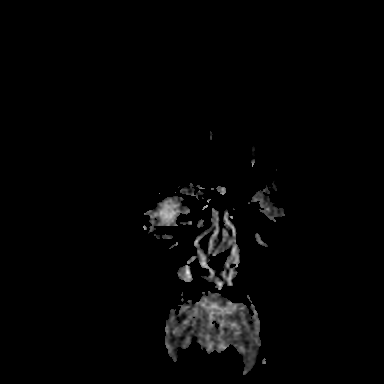

[Series 9: T1 · sagittal · 5.0mm · 0.62mm/px · 2 of 20 slices shown (1 of 2)]
[im 1/20]
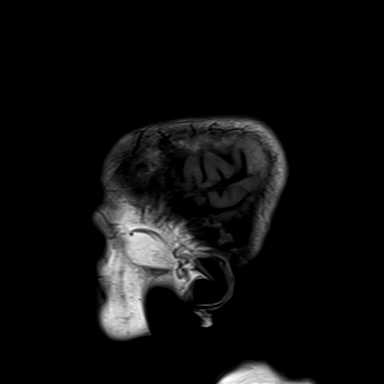
[im 20/20]
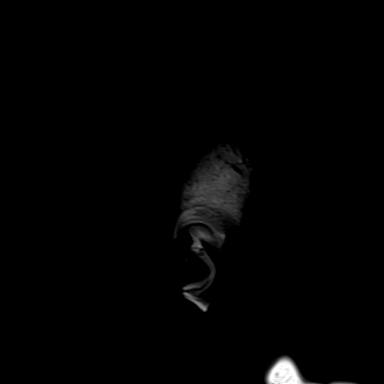

[Series 10: T2 · axial · 5.0mm · 0.45mm/px · z∈[-95,+48]mm · 2 of 25 slices shown (1 of 2)]
[im 1/25]
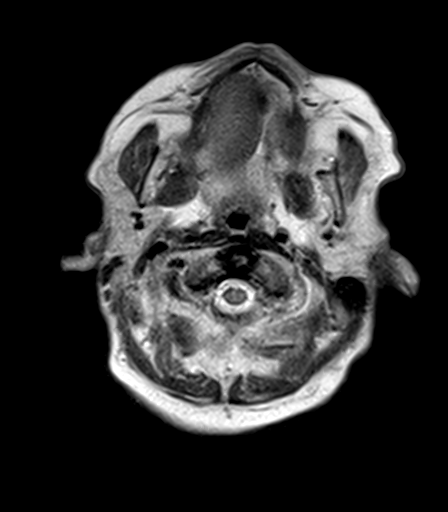
[im 25/25]
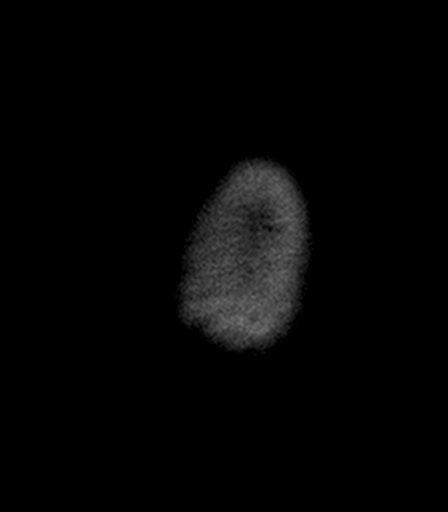

[Series 11: mag_images · axial · 3.0mm · 0.90mm/px · z∈[-111,+64]mm · 5 of 60 slices shown]
[im 1/60]
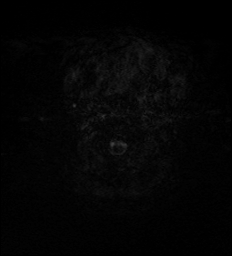
[im 15/60]
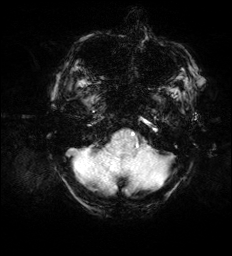
[im 30/60]
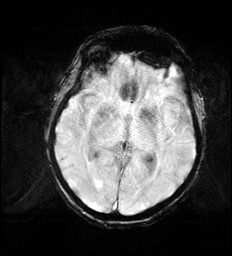
[im 45/60]
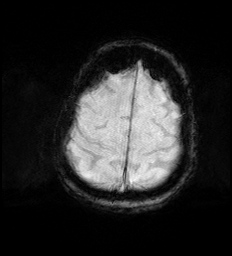
[im 60/60]
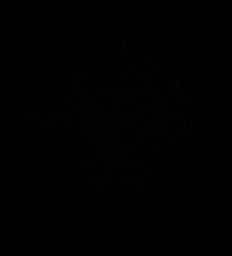

[Series 12: pha_images · axial · 3.0mm · 0.90mm/px · z∈[-108,+58]mm · 4 of 57 slices shown]
[im 1/57]
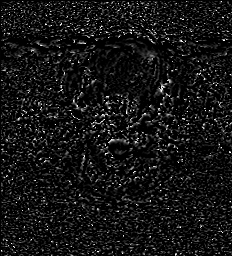
[im 19/57]
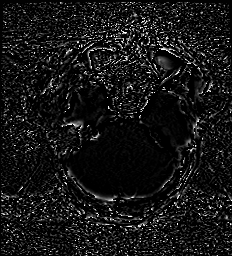
[im 38/57]
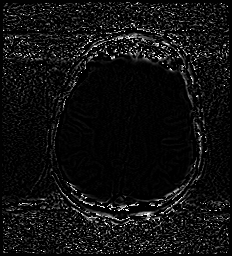
[im 57/57]
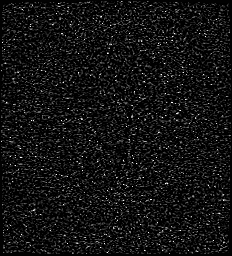

[Series 13: swi_images · axial · 3.0mm · 0.90mm/px · z∈[-111,+64]mm · 5 of 60 slices shown]
[im 1/60]
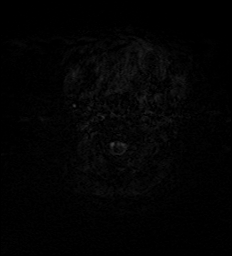
[im 15/60]
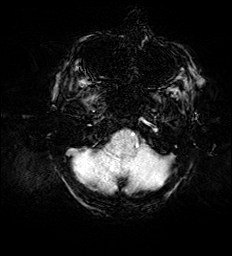
[im 30/60]
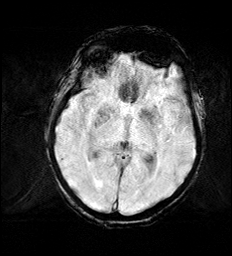
[im 45/60]
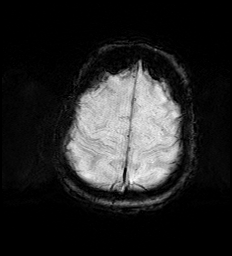
[im 60/60]
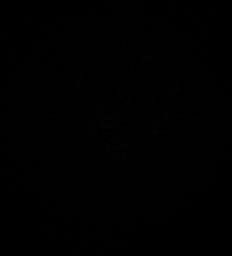

[Series 15: FLAIR · axial · 3.0mm · 0.53mm/px · z∈[-95,+47]mm · 4 of 49 slices shown]
[im 1/49]
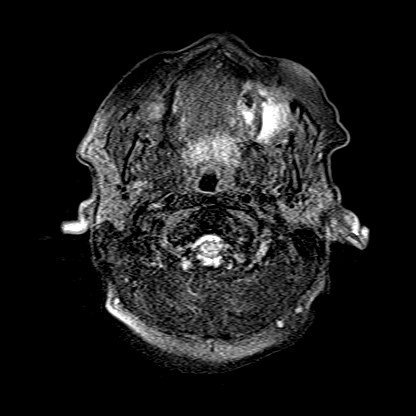
[im 17/49]
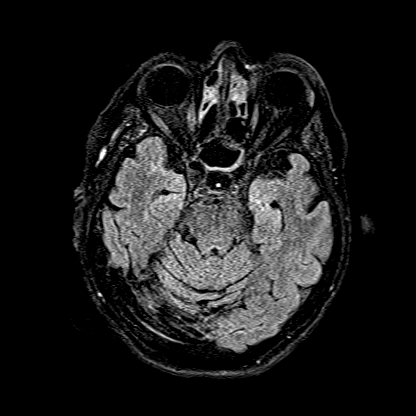
[im 33/49]
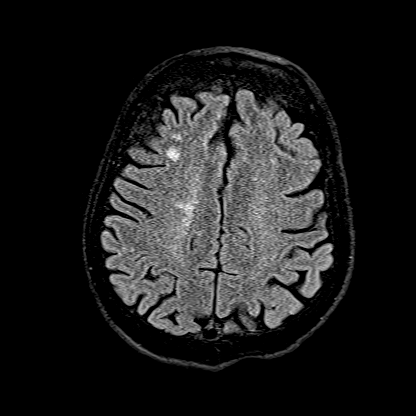
[im 49/49]
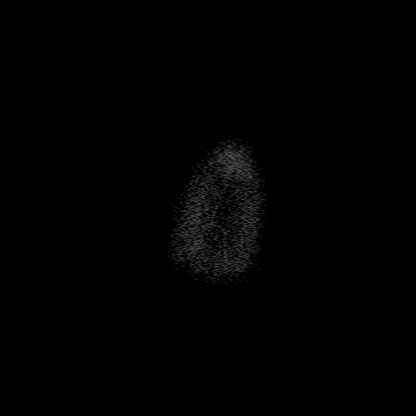

[Series 16: T1 · axial · 1.0mm · 0.98mm/px · z∈[-109,+65]mm · 10 of 176 slices shown (2 of 2)]
[im 1/176]
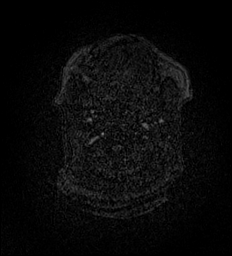
[im 15/176]
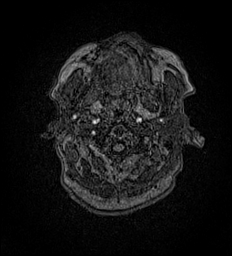
[im 30/176]
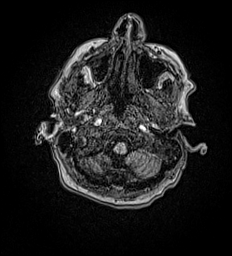
[im 44/176]
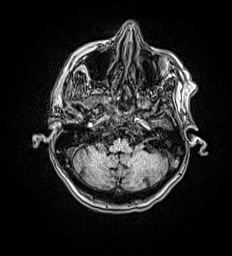
[im 59/176]
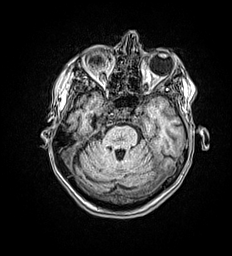
[im 73/176]
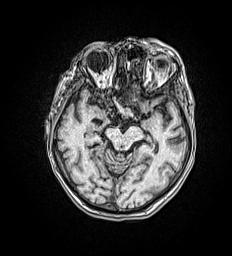
[im 103/176]
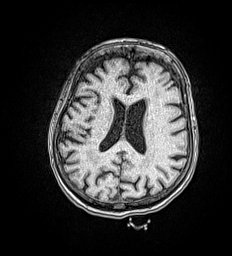
[im 117/176]
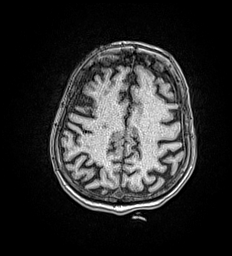
[im 146/176]
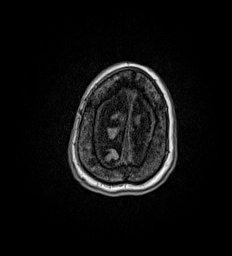
[im 176/176]
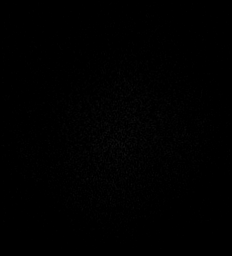

[Series 17: T2 · coronal · 5.0mm · 0.45mm/px · 2 of 28 slices shown (2 of 2)]
[im 1/28]
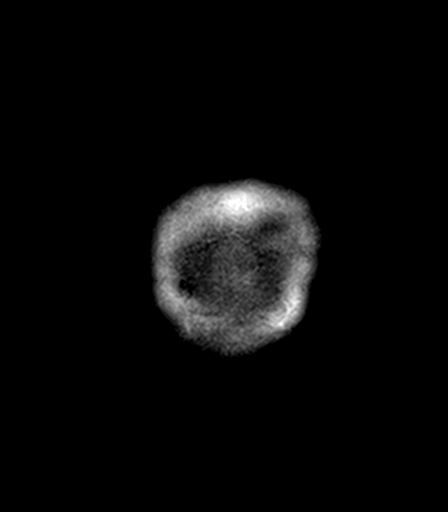
[im 28/28]
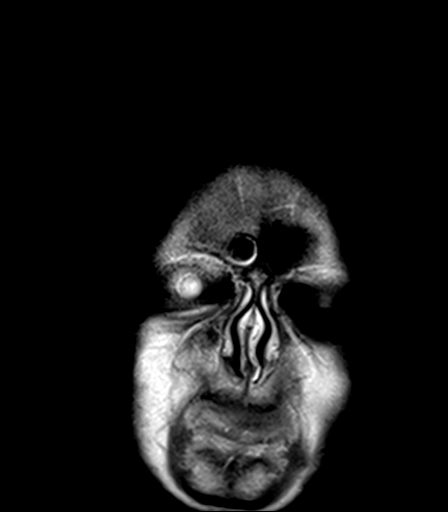

[45 of 48 positions shown; findings below may reference images not displayed]

FINDINGS: Brain: No acute infarct, mass effect or extra-axial collection. No
acute or chronic hemorrhage. There is multifocal hyperintense
T2-weighted signal within the white matter. Generalized volume loss
without a clear lobar predilection. The midline structures are
normal.

Vascular: Major flow voids are preserved.

Skull and upper cervical spine: Normal calvarium and skull base.
Visualized upper cervical spine and soft tissues are normal.

Sinuses/Orbits:Mild paranasal sinus mucosal disease. Small mastoid
effusions. Normal orbits.
IMPRESSION: 1. No acute intracranial abnormality.
2. Generalized volume loss and findings of chronic small vessel
ischemia.

## 2023-01-26 IMAGING — CR DG RIBS 2V*R*
3 series · 3 of 3 positions shown · non-contrast
Comparison: CT chest, 02/22/2021

CLINICAL DATA: Possible fall, right rib pain

EXAM:
RIGHT RIBS - 2 VIEW

[rib ap]
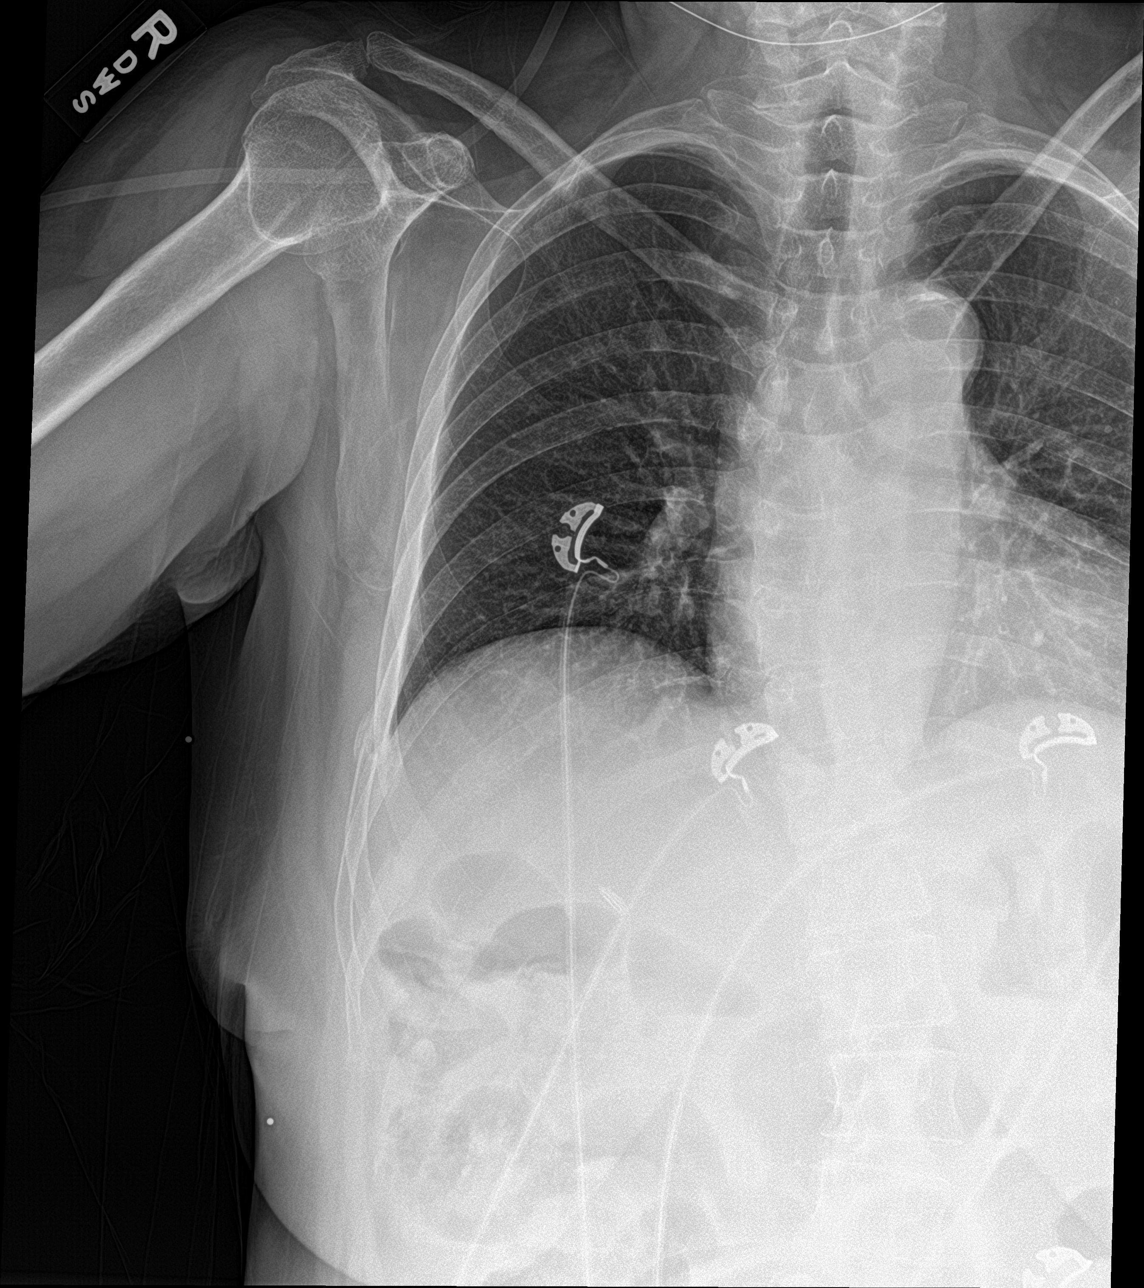

[rib ap obl (1 of 2)]
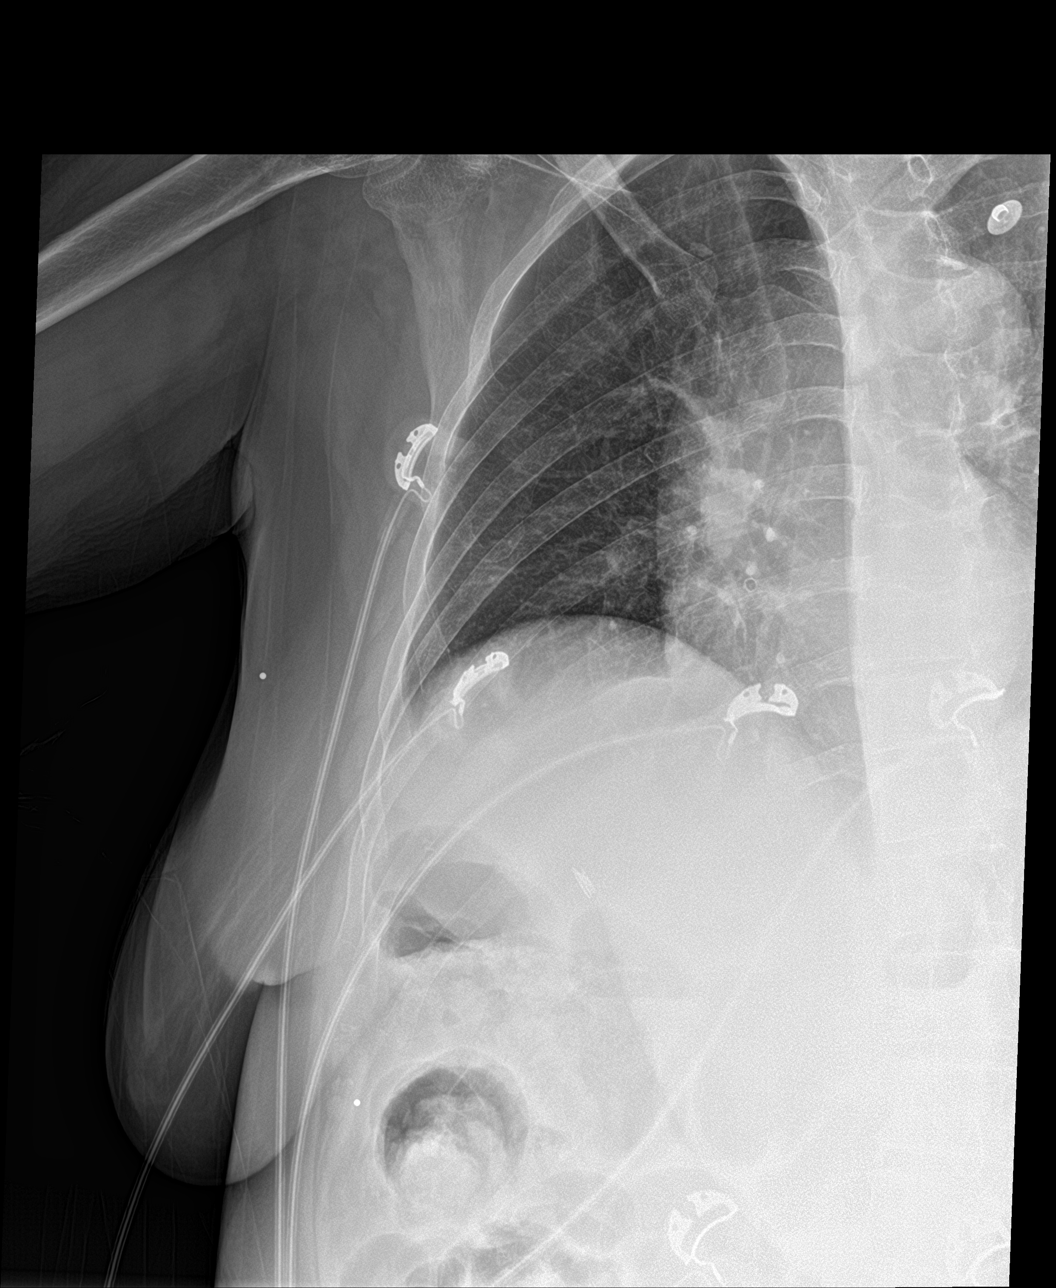

[rib ap obl (2 of 2)]
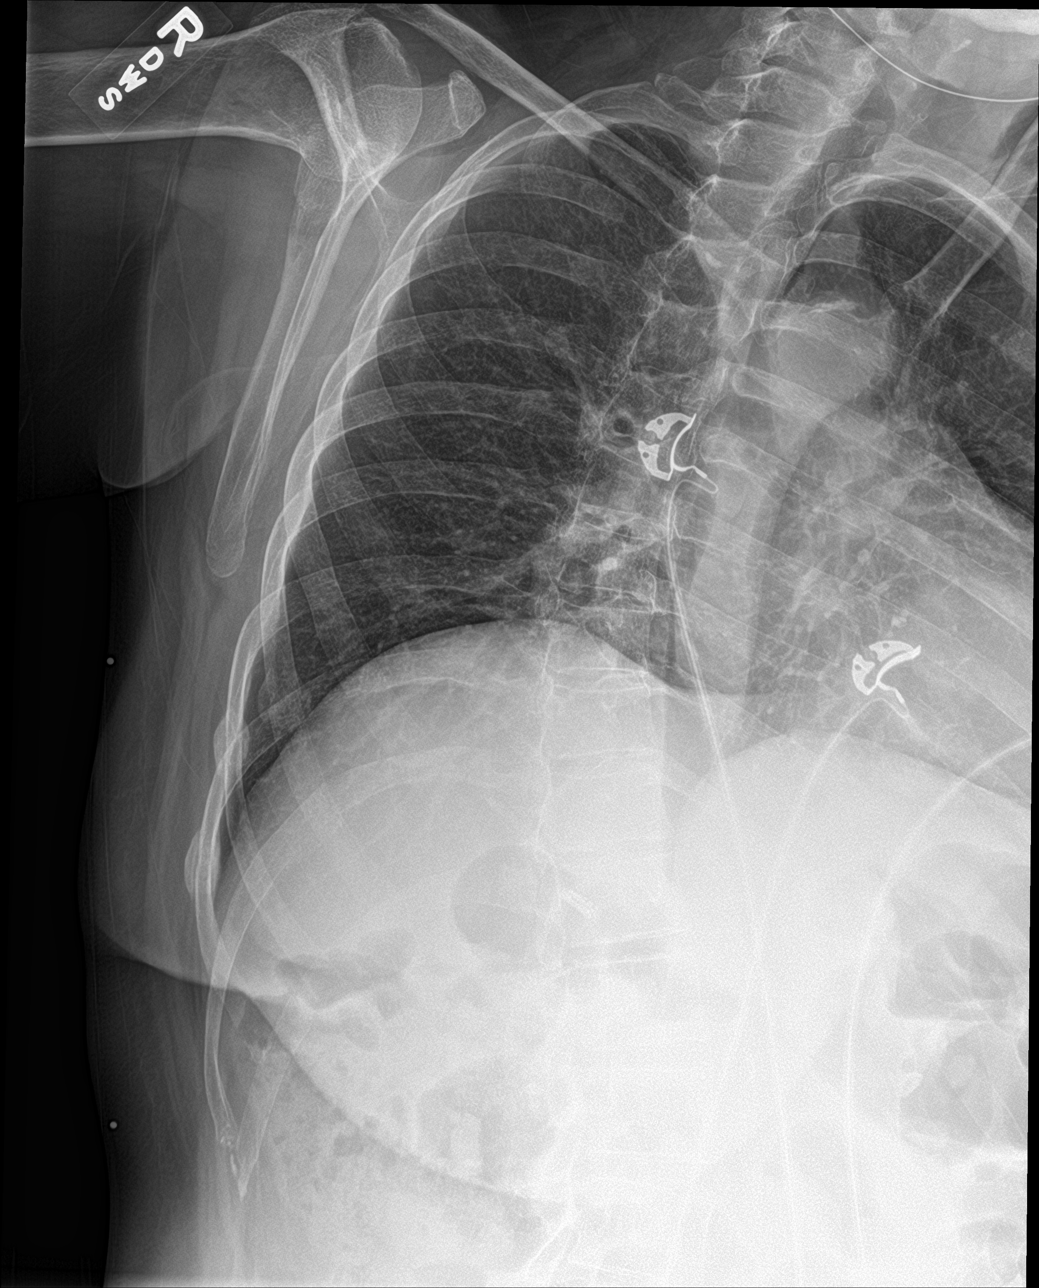

[3 of 3 positions shown; findings below may reference images not displayed]

FINDINGS: No acute displaced fractures of the right ribs are identified. There
are redemonstrated, subacute, partially callused fractures of the
right lateral eighth through tenth ribs. No pneumothorax or pleural
effusion.
IMPRESSION: No acute displaced fractures of the right ribs are identified. There
are redemonstrated, subacute, partially callused fractures of the
right lateral eighth through tenth ribs.

## 2023-01-26 IMAGING — CT CT HEAD W/O CM
3 series · 15 of 47 positions shown, 18 images · non-contrast
Comparison: 02/22/2021

CLINICAL DATA: 65 y.o. female with past medical history of
hypertension, diabetes, cirrhosis, CKD, atrial fibrillation, and
chronic pain syndrome who presents to the ED for hyperglycemia.
History is limited due to patient's altered mental status. Per EMS,
patient was found by her husband to be less responsive than usual,
when EMS arrived they found her blood sugar to be 27. She was
subsequently given D10 and blood sugar improved to 116. EMS reports
that patient became more awake and alert, but remains confused.
Patient endorses pain in her chest as well as her back, states these
have been going on for a long time. She denies any fevers, cough,
shortness of breath, nausea, vomiting, abdominal pain, diarrhea,
dysuria, or hematuria. She is not sure when her last meal was or
when she last took insulin.

EXAM:
CT HEAD WITHOUT CONTRAST
TECHNIQUE: Contiguous axial images were obtained from the base of the skull
through the vertex without intravenous contrast.

[Series 3: head wo · axial · 0.42mm/px · z∈[-45,+80]mm · 9 of 30 slices shown, 12 images]
[im 3/30  brain]
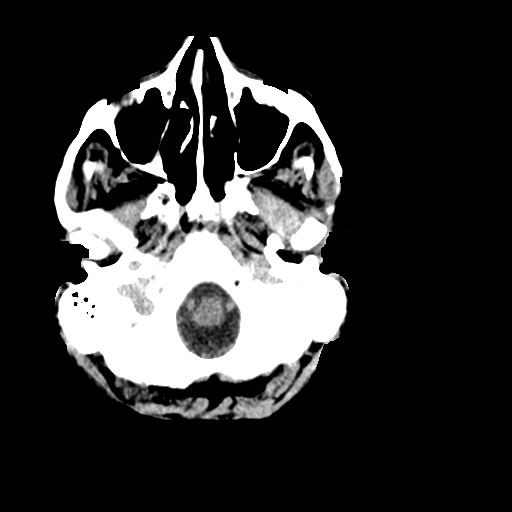
[im 3/30  bone]
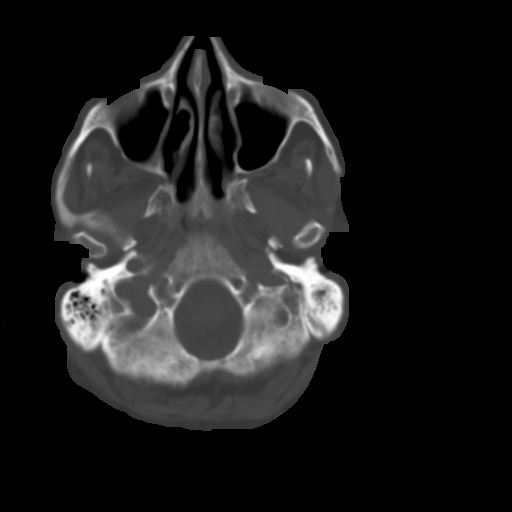
[im 6/30  brain]
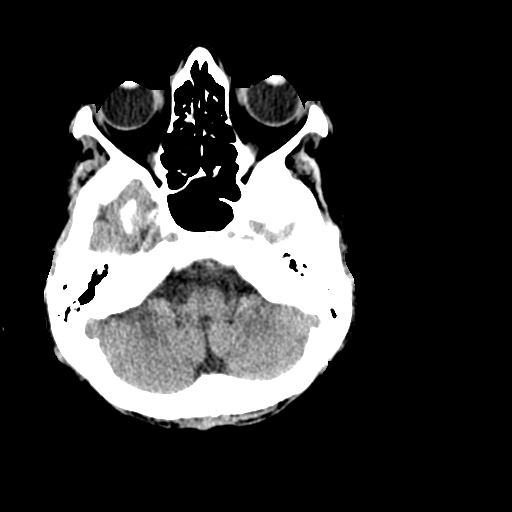
[im 9/30  brain]
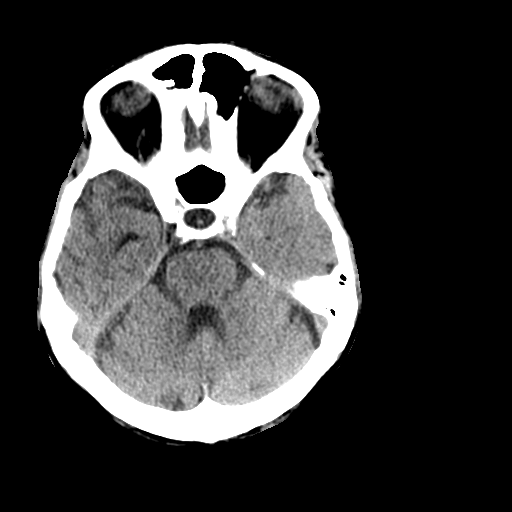
[im 12/30  brain]
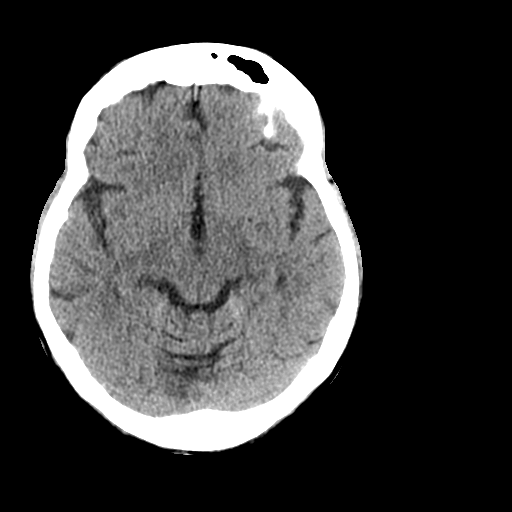
[im 16/30  brain]
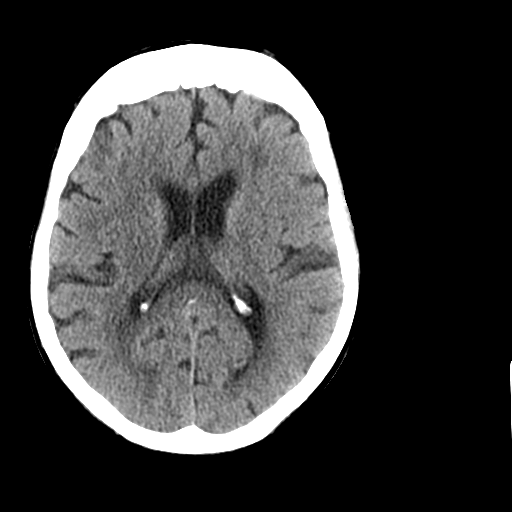
[im 16/30  bone]
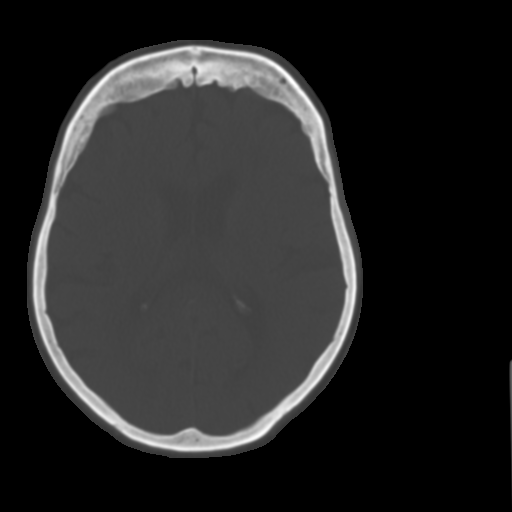
[im 19/30  brain]
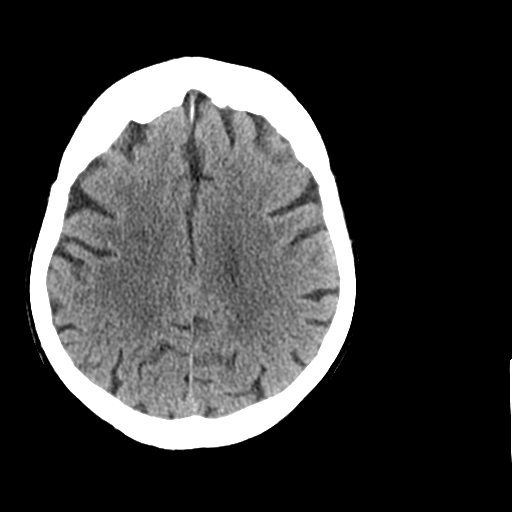
[im 22/30  brain]
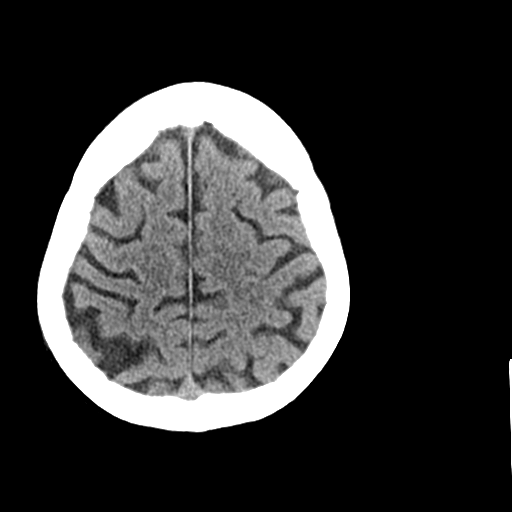
[im 25/30  brain]
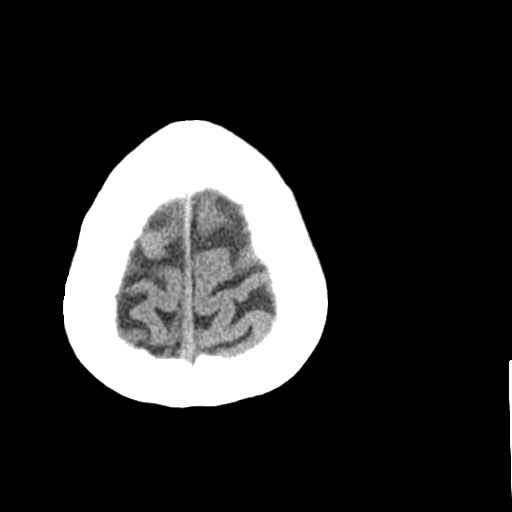
[im 28/30  brain]
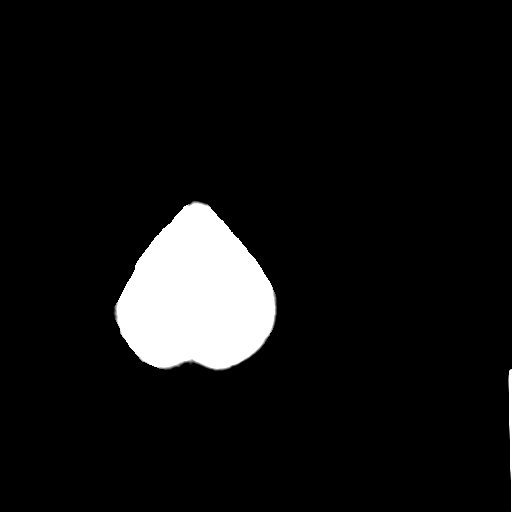
[im 28/30  bone]
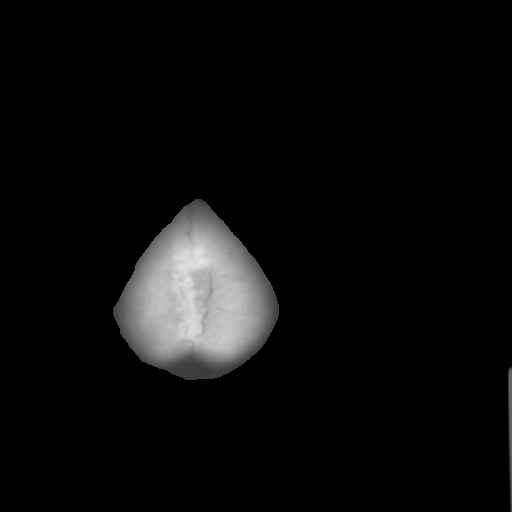

[Series 4: coronal soft tissue · coronal · 0.31mm/px · 3 of 68 slices shown]
[im 23/68  brain]
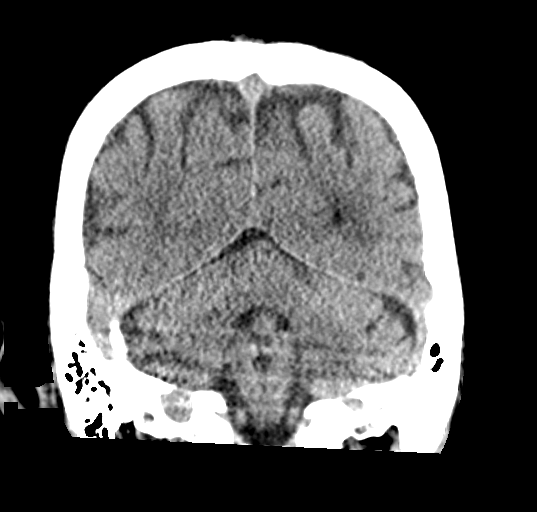
[im 30/68  brain]
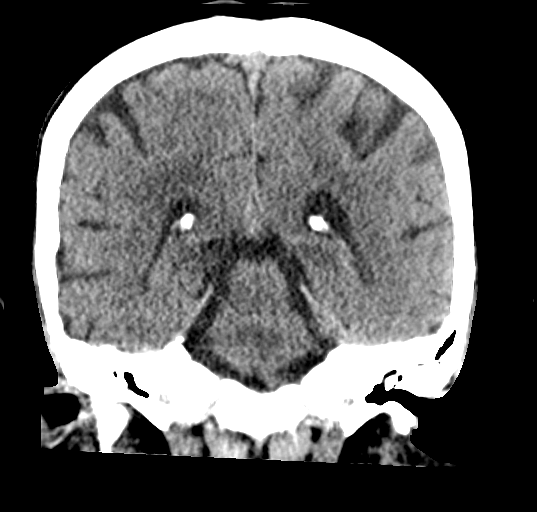
[im 38/68  brain]
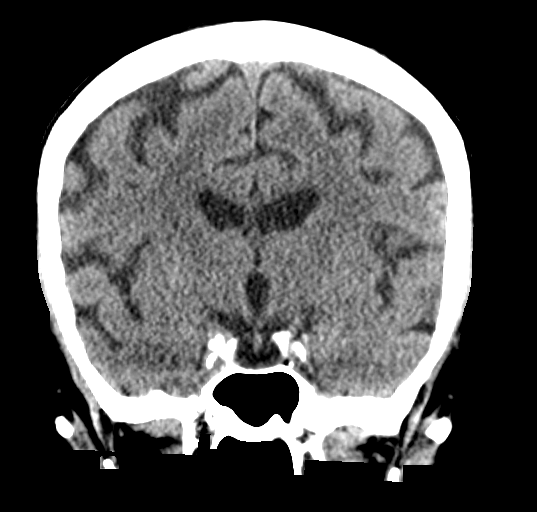

[Series 5: sagittal soft tissue · sagittal · 0.30mm/px · 3 of 57 slices shown]
[im 19/57  brain]
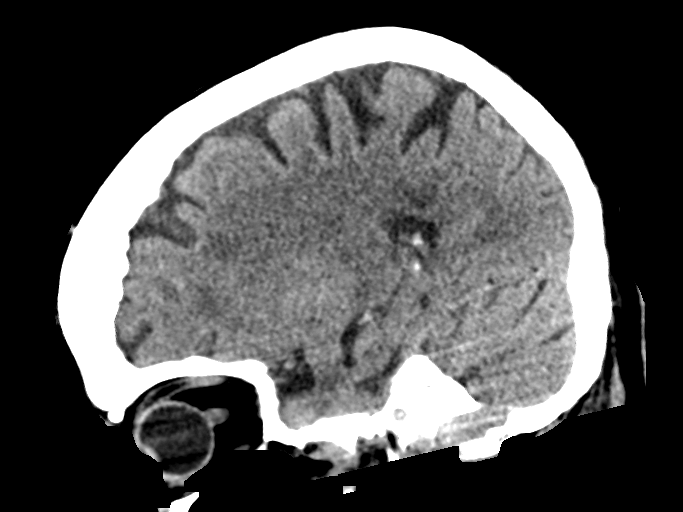
[im 29/57  brain]
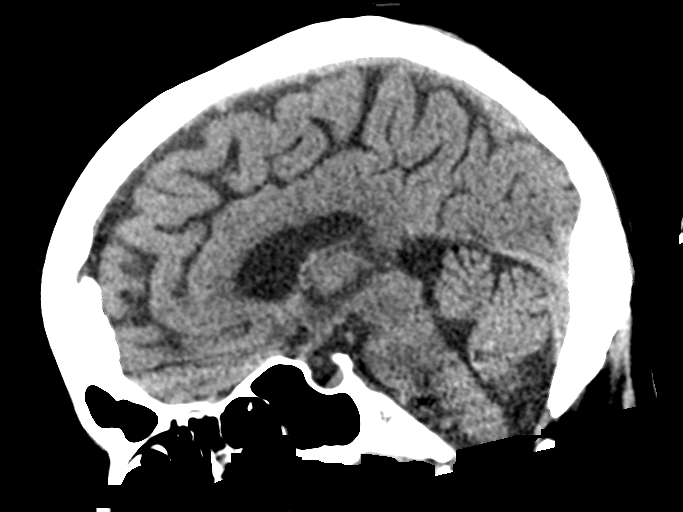
[im 38/57  brain]
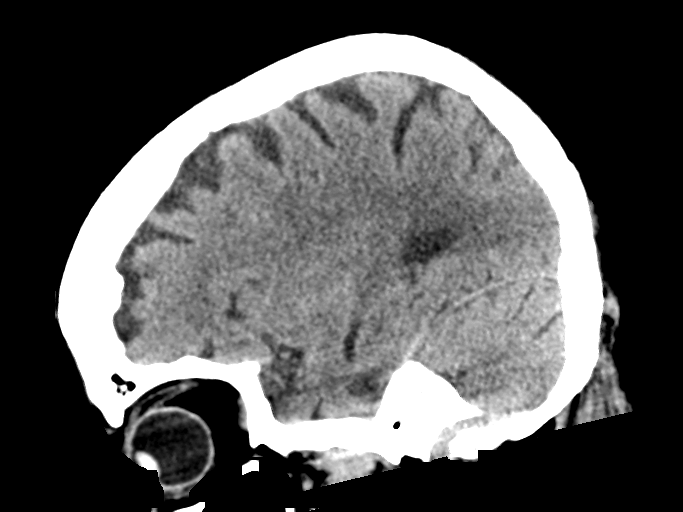

[15 of 47 positions shown; findings below may reference images not displayed]

FINDINGS: Brain: No evidence of acute infarction, hemorrhage, hydrocephalus,
extra-axial collection or mass lesion/mass effect.

There is ventricular and sulcal enlargement reflecting age related
volume loss. Mild patchy white matter hypoattenuation is also noted
consistent with chronic microvascular ischemic change. These
findings are stable.

Vascular: No hyperdense vessel or unexpected calcification.

Skull: Normal. Negative for fracture or focal lesion.

Sinuses/Orbits: Globes and orbits are unremarkable. Trace amount of
dependent fluid in the right maxillary sinus. Minor right ethmoid,
sphenoid and inferior right frontal sinus mucosal thickening.

Other: None.
IMPRESSION: 1. No acute intracranial abnormalities.
2. Age related volume loss and mild chronic microvascular ischemic
change.
3. Mild sinus disease improved compared to the prior CT.

## 2023-01-26 IMAGING — DX DG CHEST 1V PORT
2 series · 2 of 2 positions shown · non-contrast
Comparison: 02/24/2021 and older studies.

CLINICAL DATA: Hypoglycemia. Confused patient. Chest and back pain.

EXAM:
PORTABLE CHEST 1 VIEW

[chest ap (1 of 2)]
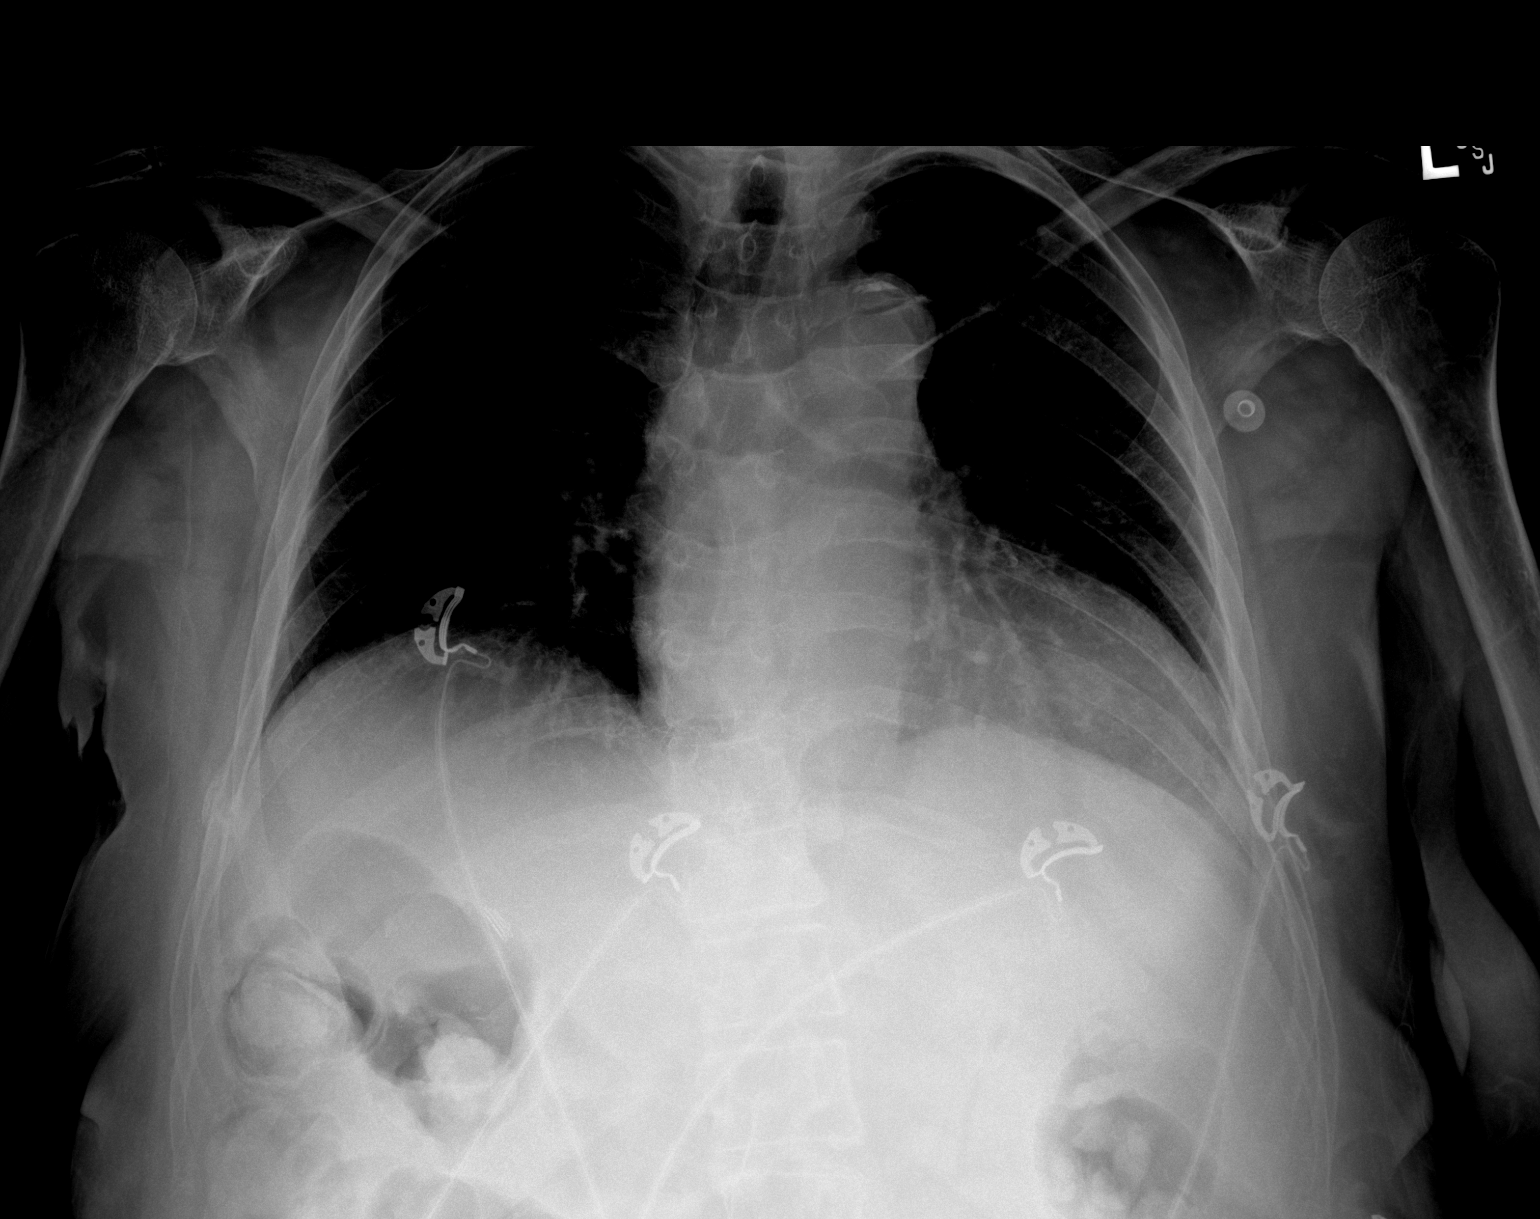

[chest ap (2 of 2)]
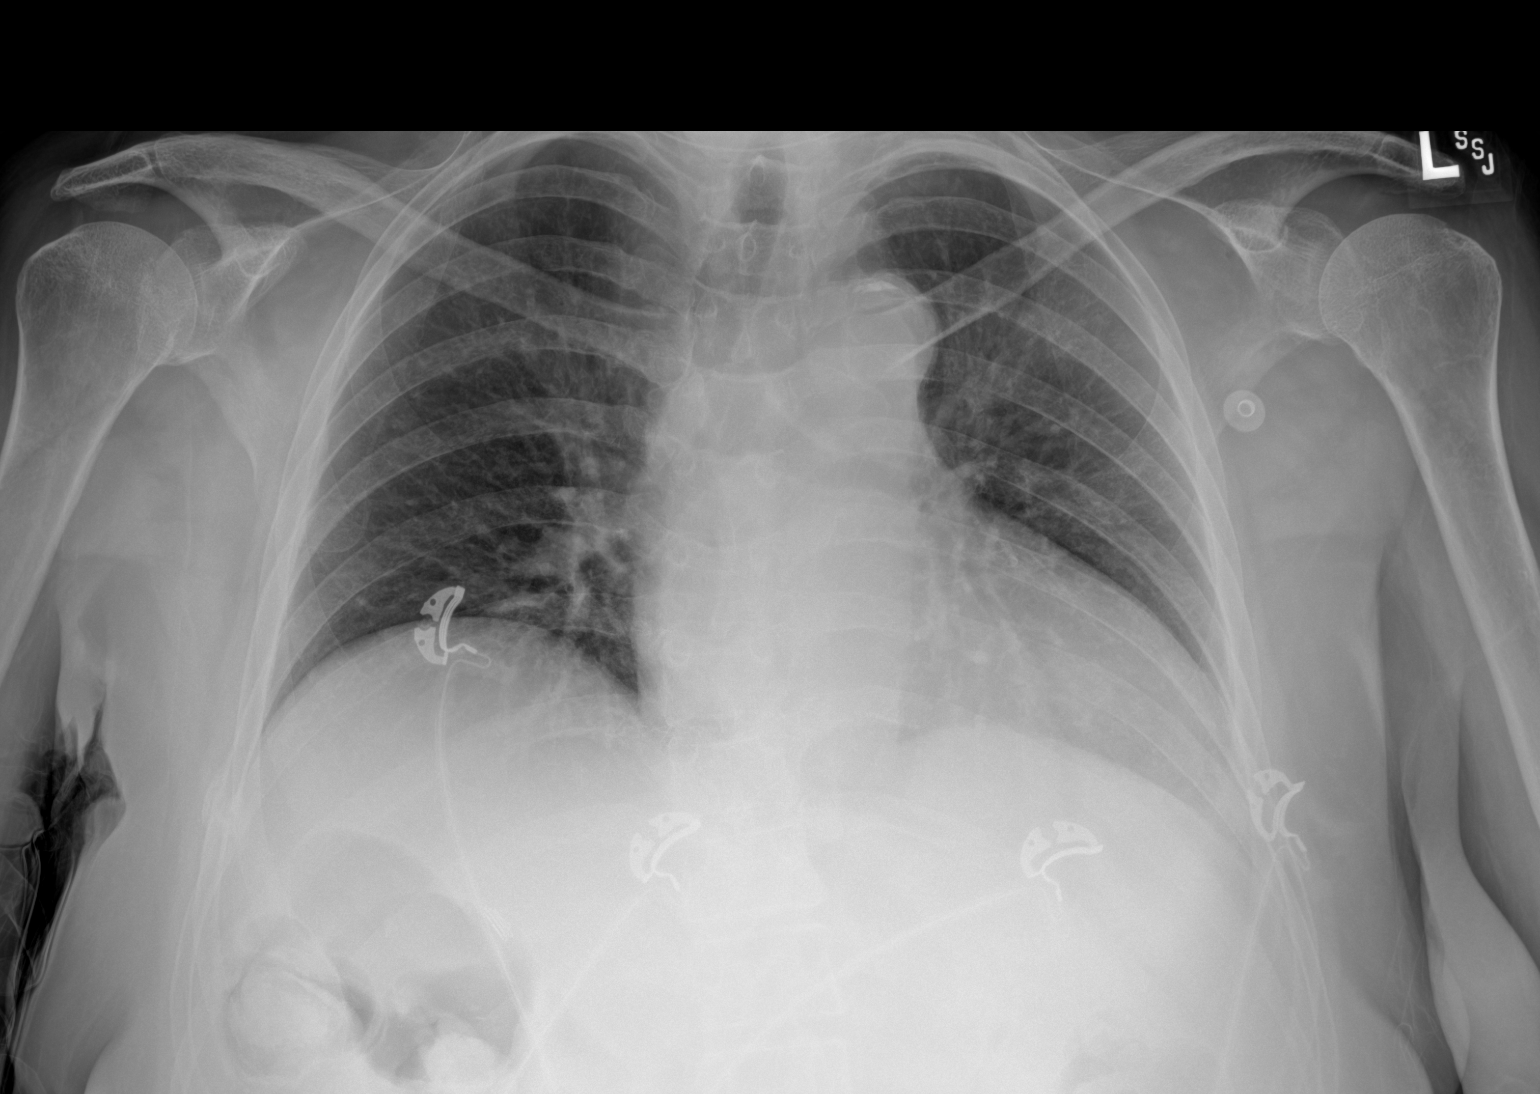

[2 of 2 positions shown; findings below may reference images not displayed]

FINDINGS: Cardiac silhouette is normal in size.  No mediastinal hilar masses.

Clear lungs.  No convincing pleural effusion or pneumothorax.

Skeletal structures are grossly intact.
IMPRESSION: No active disease.

## 2023-05-28 IMAGING — CR DG CHEST 2V
1 series · 2 of 2 positions shown · non-contrast
Comparison: March 04, 2021

CLINICAL DATA: Shortness of breath with chest pain and cough.

EXAM:
CHEST - 2 VIEW

[Series 1: dg chest 2 view · 0.14mm/px · 2 of 2 slices shown]
[im 1/2]
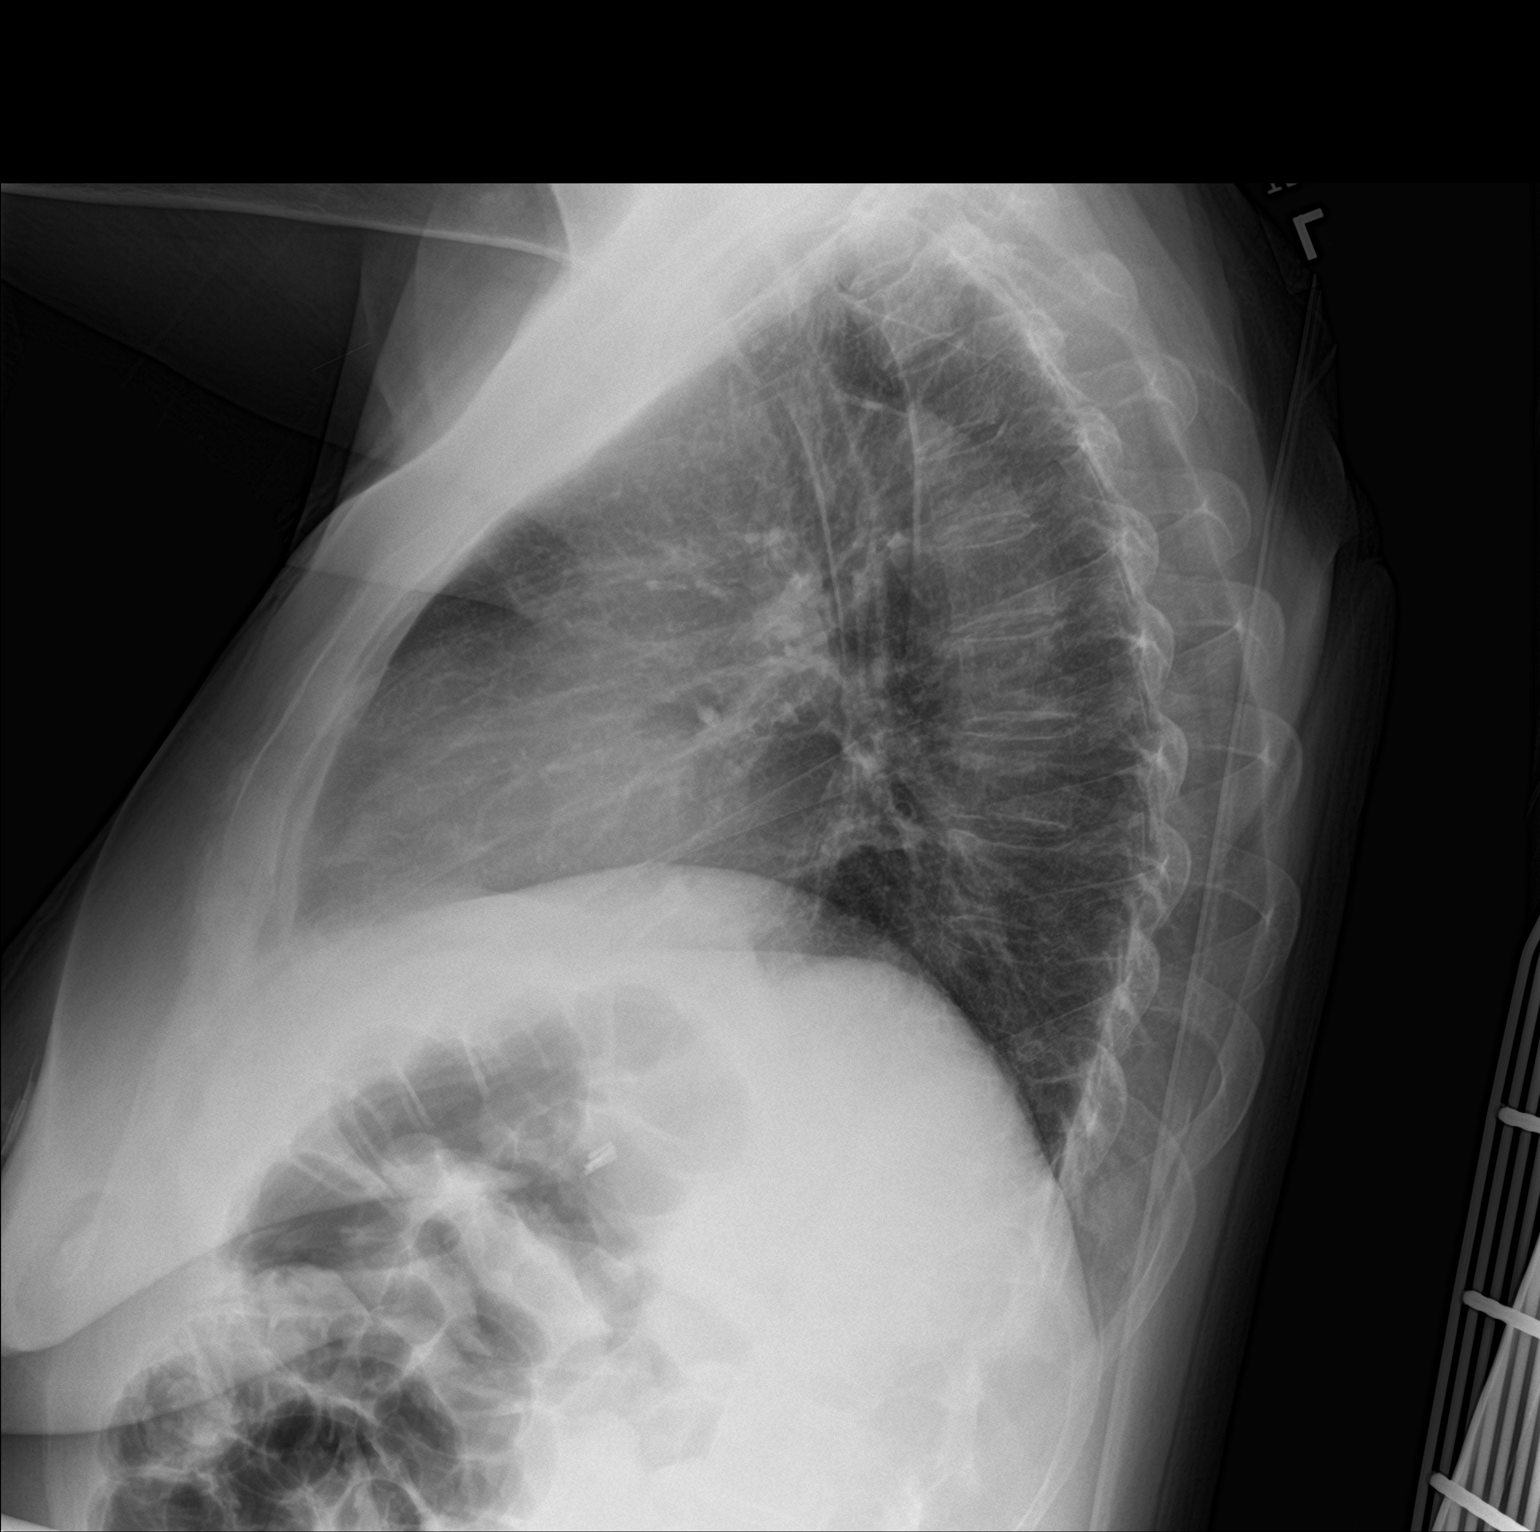
[im 2/2]
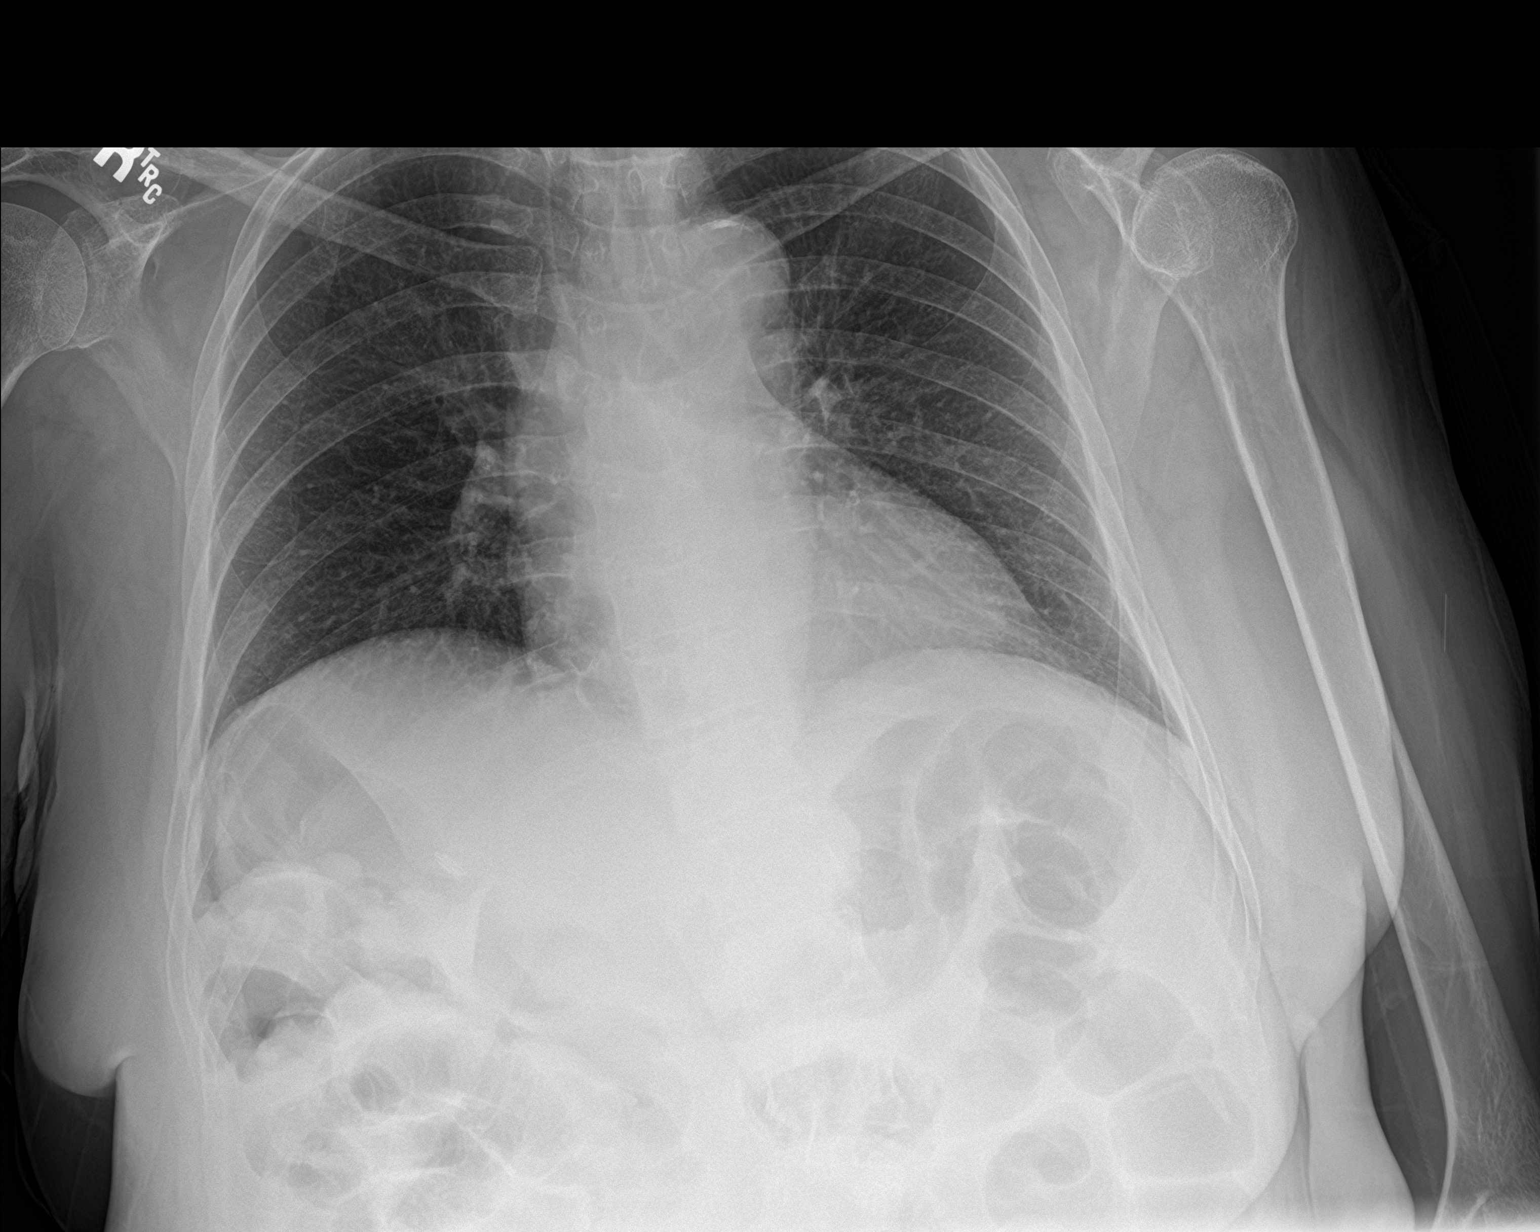

[2 of 2 positions shown; findings below may reference images not displayed]

FINDINGS: The heart size and mediastinal contours are within normal limits.
There is mild calcification of the aortic arch. Both lungs are
clear. Radiopaque surgical clips are seen within the right upper
quadrant. The visualized skeletal structures are unremarkable.
IMPRESSION: No active cardiopulmonary disease.

## 2023-06-13 IMAGING — CT CT ABD-PELV W/ CM
2 of 5 series · 15 of 46 positions shown, 17 images · IV contrast (APPLIED)
Comparison: CT abdomen pelvis 11/09/2020, CT abdomen pelvis
12/05/2019, CT abdomen pelvis 11/23/2019, septum pelvis 02/28/2020

CLINICAL DATA: Nausea and vomiting, epigastric pain.

EXAM:
CT ABDOMEN AND PELVIS WITH CONTRAST
TECHNIQUE: Multidetector CT imaging of the abdomen and pelvis was performed
using the standard protocol following bolus administration of
intravenous contrast.
CONTRAST:  60mL OMNIPAQUE IOHEXOL 300 MG/ML  SOLN

[Series 2: axial st · axial · 0.74mm/px · z∈[-743,-308]mm · 12 of 99 slices shown, 14 images]
[im 6/99  soft-tissue]
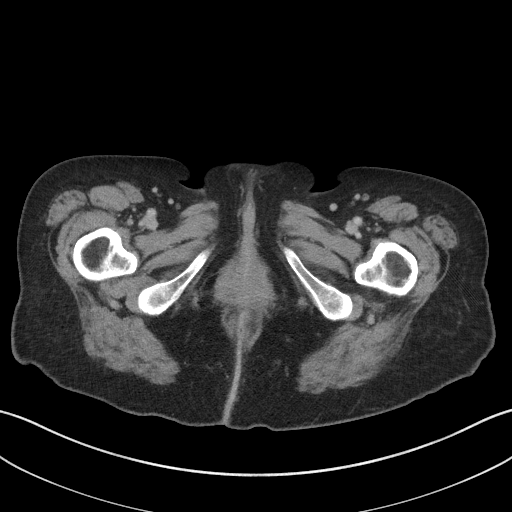
[im 6/99  bone]
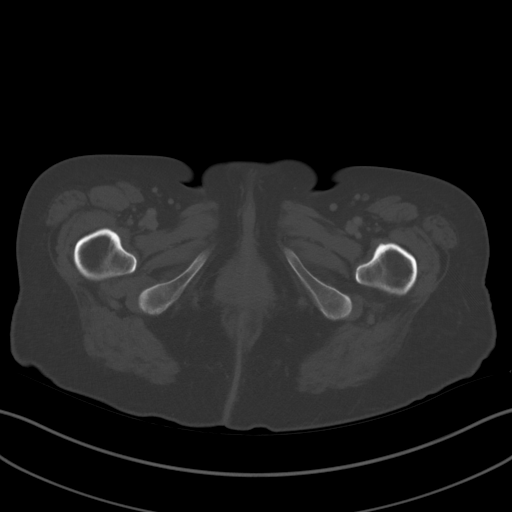
[im 16/99  soft-tissue]
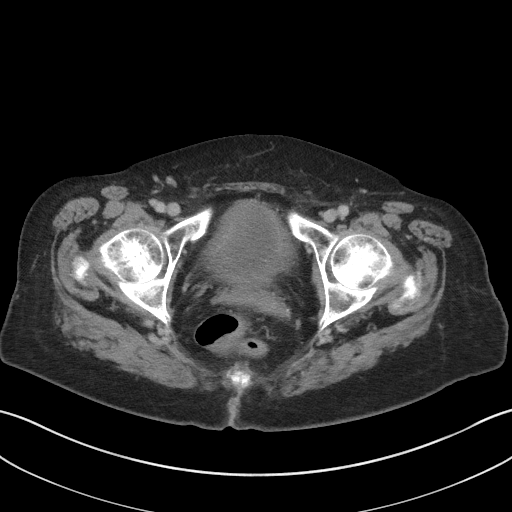
[im 21/99  soft-tissue]
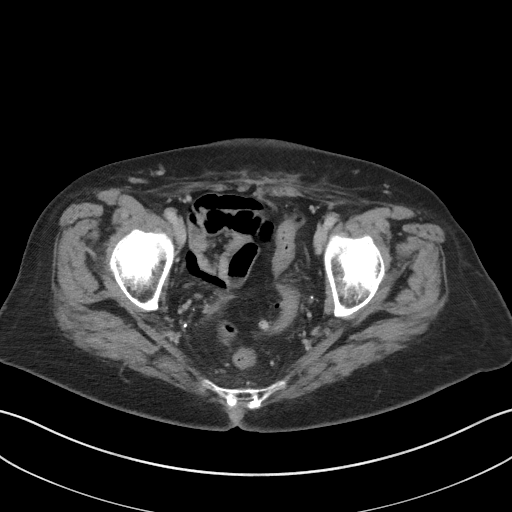
[im 31/99  soft-tissue]
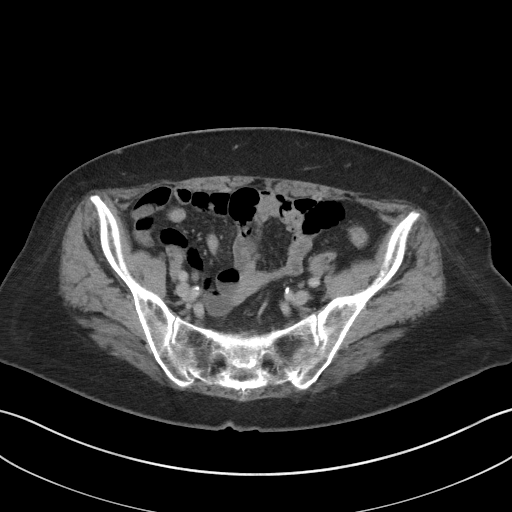
[im 37/99  soft-tissue]
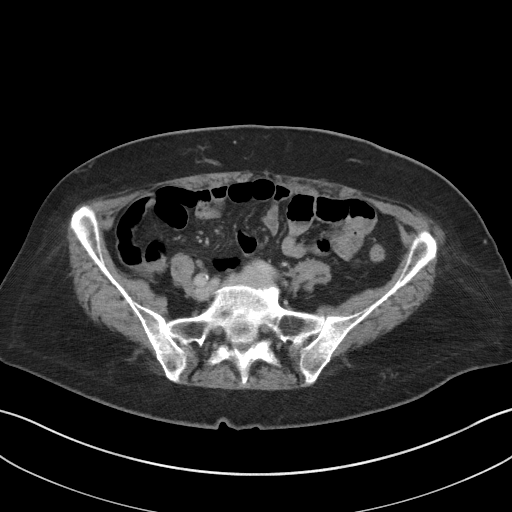
[im 47/99  soft-tissue]
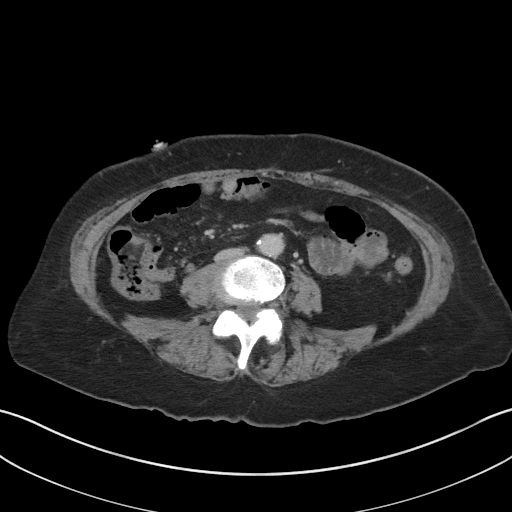
[im 52/99  soft-tissue]
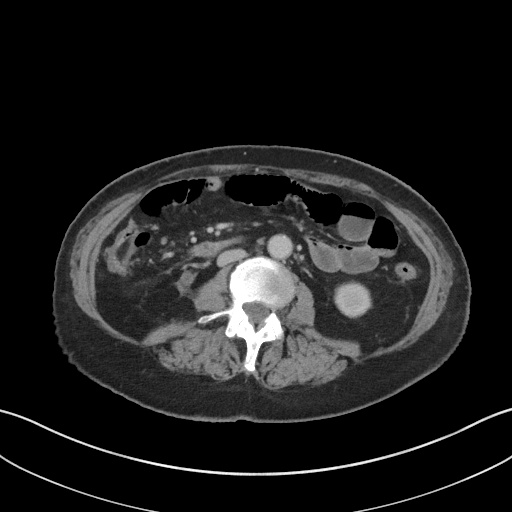
[im 62/99  soft-tissue]
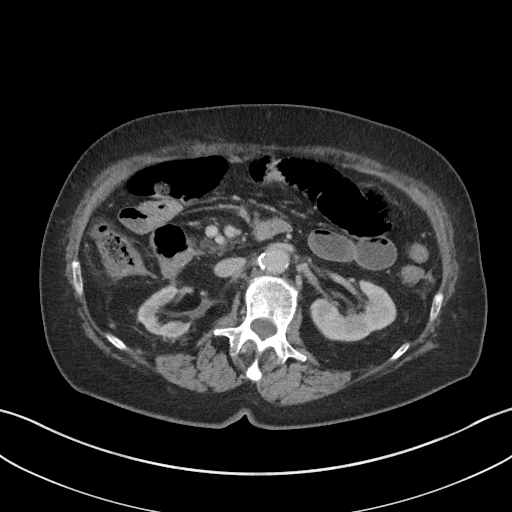
[im 68/99  soft-tissue]
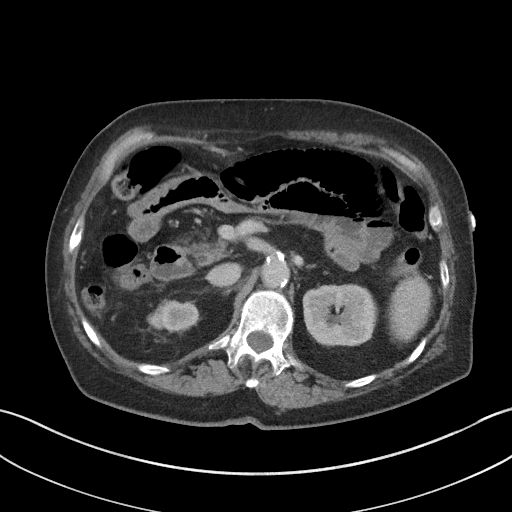
[im 68/99  bone]
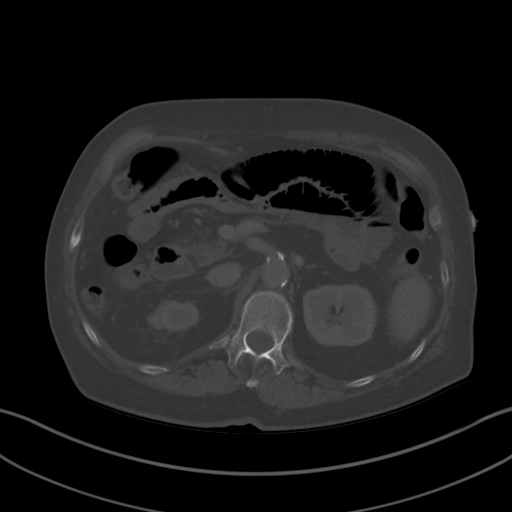
[im 78/99  soft-tissue]
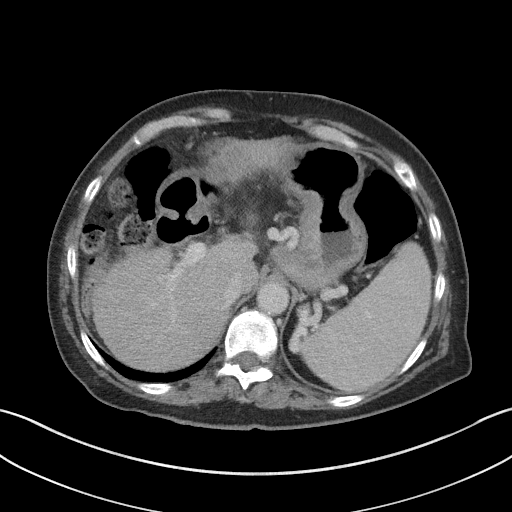
[im 83/99  soft-tissue]
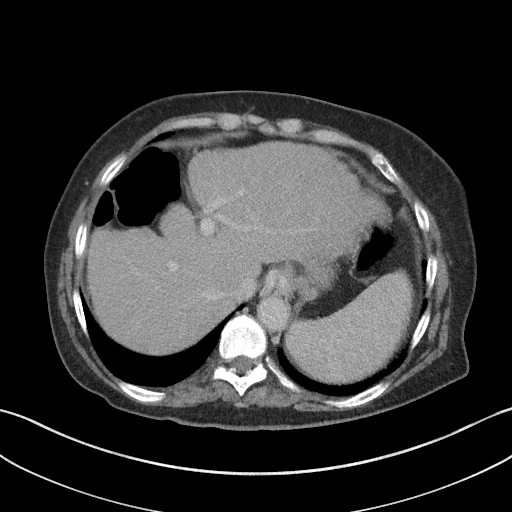
[im 93/99  soft-tissue]
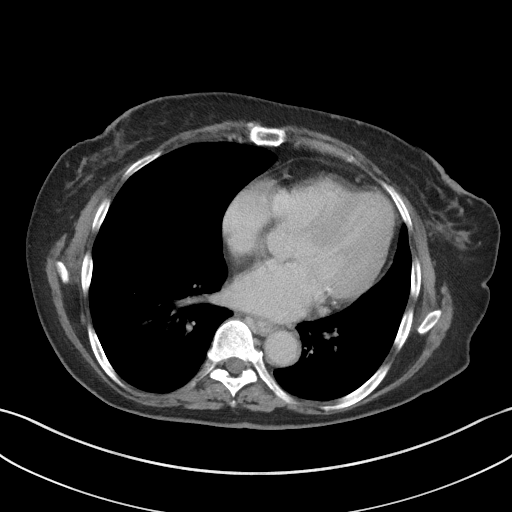

[Series 5: coronal st · coronal · 0.80mm/px · 3 of 82 slices shown]
[im 28/82  soft-tissue]
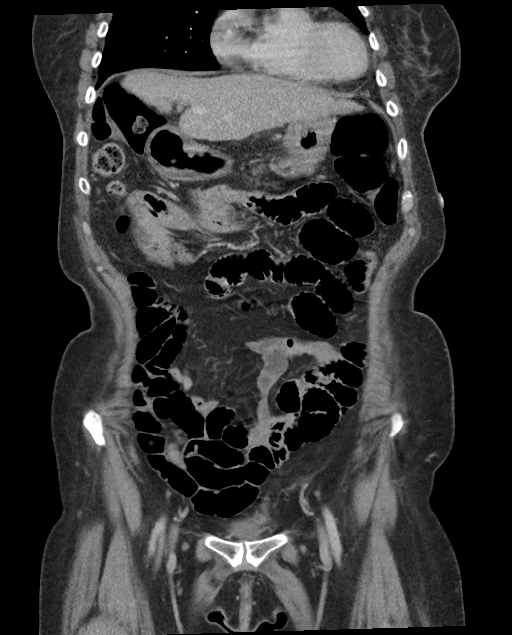
[im 37/82  soft-tissue]
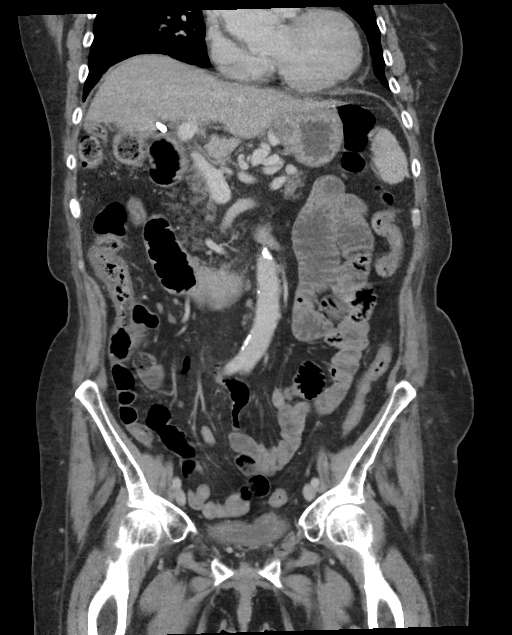
[im 46/82  soft-tissue]
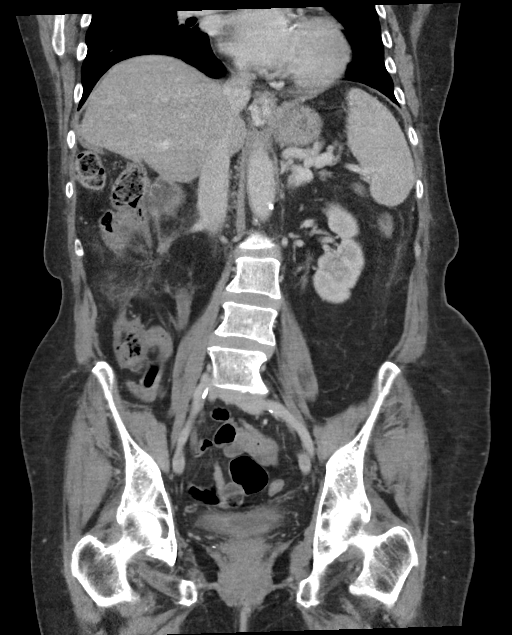

[15 of 46 positions shown; findings below may reference images not displayed]

FINDINGS: Lower chest: No acute abnormality.

Hepatobiliary: Nodular hepatic contour. No focal lesion with limited
evaluation on this single phase portal venous study. Status post
cholecystectomy.

Pancreas: No focal lesion. Normal pancreatic contour. No surrounding
inflammatory changes. No main pancreatic ductal dilatation.

Spleen: Normal in size without focal abnormality.

Adrenals/Urinary Tract:

No adrenal nodule bilaterally.

Bilateral kidneys enhance symmetrically. Atrophic and scarred right
kidney.

No hydronephrosis. Similar-appearing prominent right collecting
system with an associated extrarenal renal pelvis on the right. No
frank hydroureter.

The urinary bladder is decompressed with persistent irregular
diffuse urinary bladder wall thickening and vague perivascular fat
stranding.

On delayed imaging, there is no urothelial wall thickening and there
are no filling defects in the opacified portions of the bilateral
collecting systems or ureters.

Stomach/Bowel: Stomach is within normal limits. No evidence of bowel
wall thickening or dilatation. Scattered colonic diverticulosis.
Appendix appears normal.

Vascular/Lymphatic: Esophageal varices. Venous collaterals. The main
portal, splenic, superior mesenteric veins are patent. No abdominal
aorta or iliac aneurysm. Severe atherosclerotic plaque of the aorta
and its branches. No abdominal, pelvic, or inguinal lymphadenopathy.

Reproductive: Status post hysterectomy. No adnexal masses.

Other: No intraperitoneal free fluid. No intraperitoneal free gas.
No organized fluid collection.

Musculoskeletal:

No abdominal wall hernia or abnormality.

No suspicious lytic or blastic osseous lesions. No acute displaced
fracture.
IMPRESSION: 1. Persistent irregular diffuse urinary bladder wall thickening and
vague perivascular fat stranding. Similar-appearing prominent right
collecting system with delayed excretion of intravenous contrast
from the right kidney. Atrophic and scarred right kidney. Underlying
infection is not excluded even though there are chronic findings. A
malignancy cannot be fully excluded on this study. Recommend at
least correlation with urinalysis.
2. Cirrhosis with portal hypertension. Consider MRI liver protocol
further evaluation.
3. Scattered colonic diverticulosis with no acute diverticulitis.
4.  Aortic Atherosclerosis ([id number]).

## 2023-06-13 IMAGING — CR DG CHEST 2V
1 series · 2 of 2 positions shown · non-contrast
Comparison: Previous studies including the examination of
07/04/2021

CLINICAL DATA: Chest pain

EXAM:
CHEST - 2 VIEW

[Series 1: dg chest 2 view · 0.14mm/px · 2 of 2 slices shown]
[im 1/2]
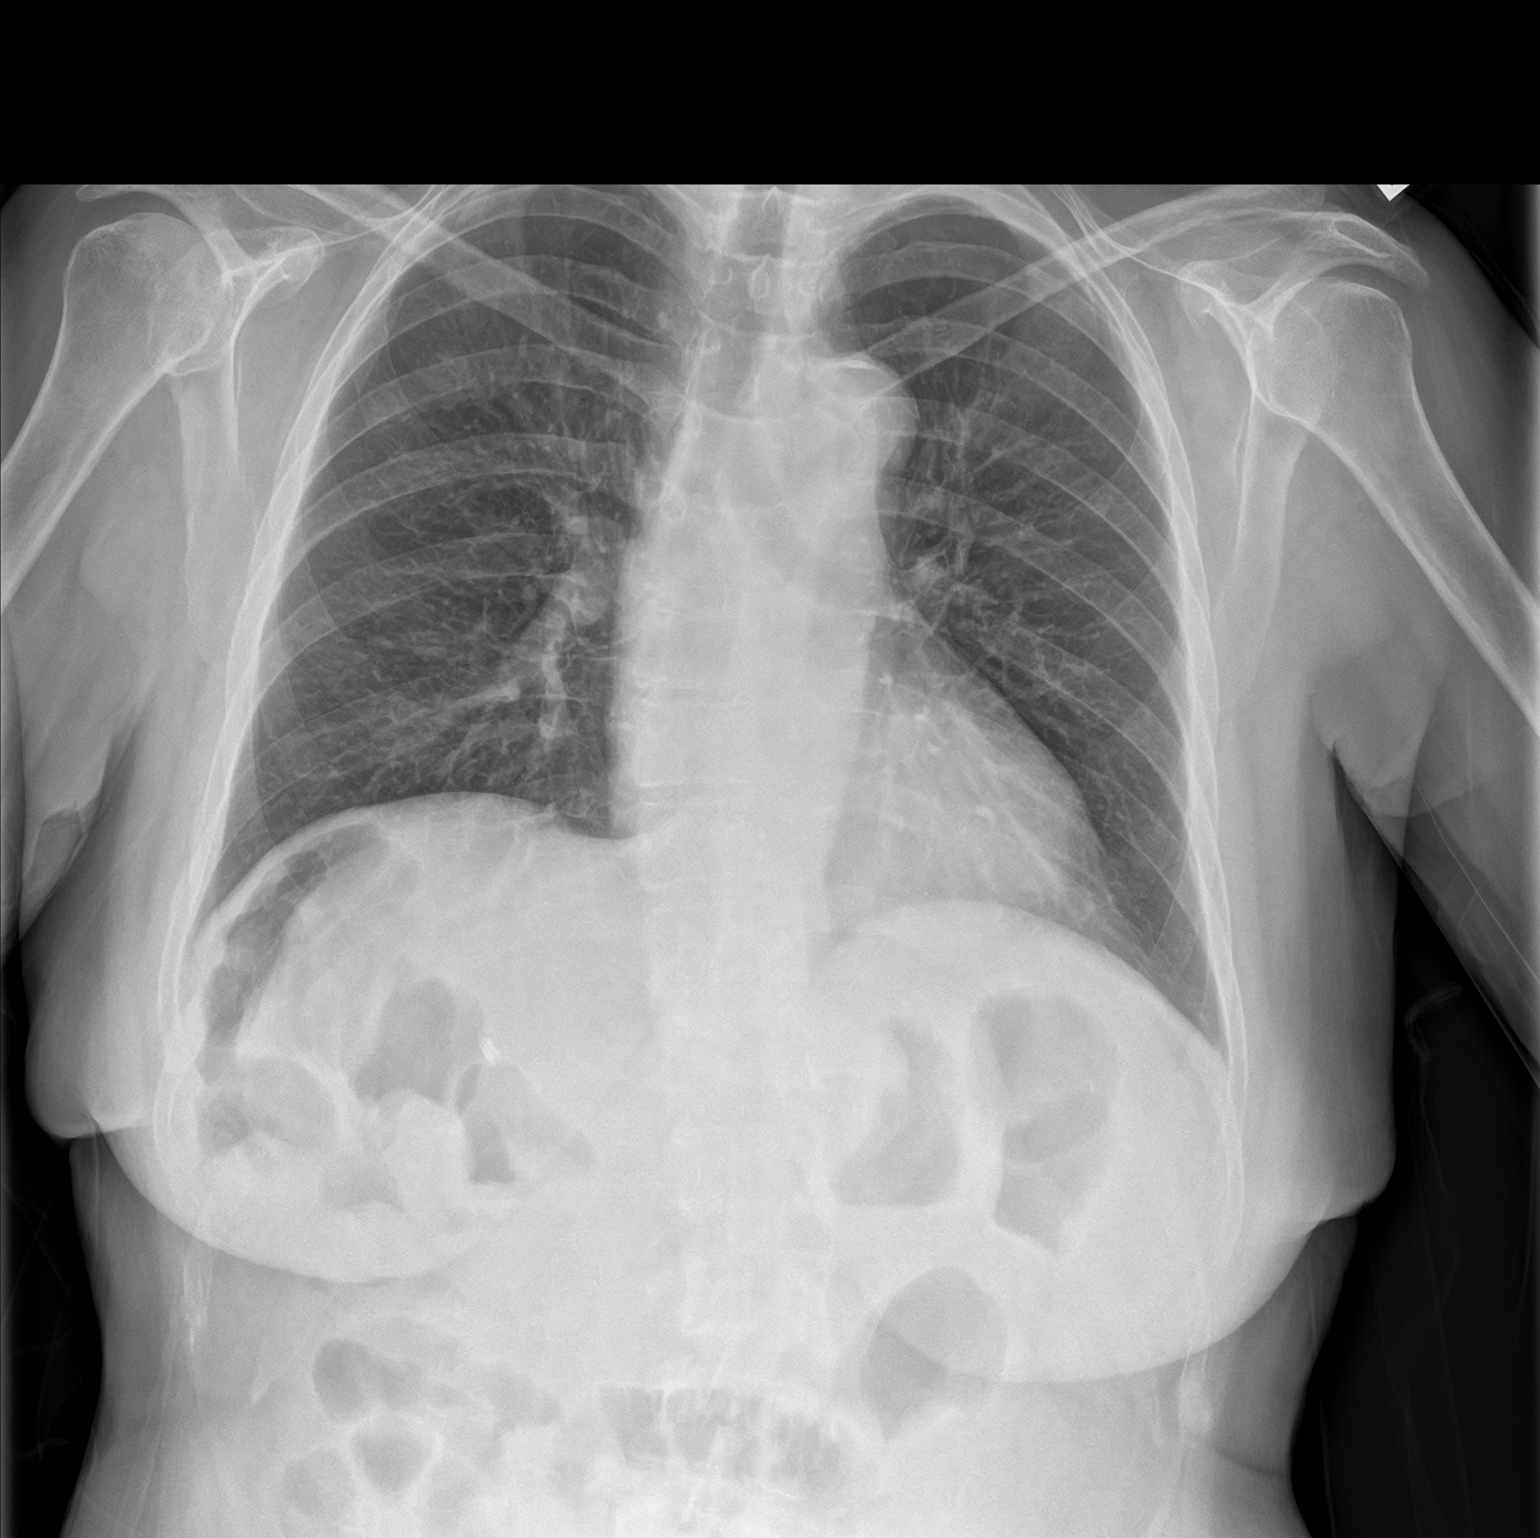
[im 2/2]
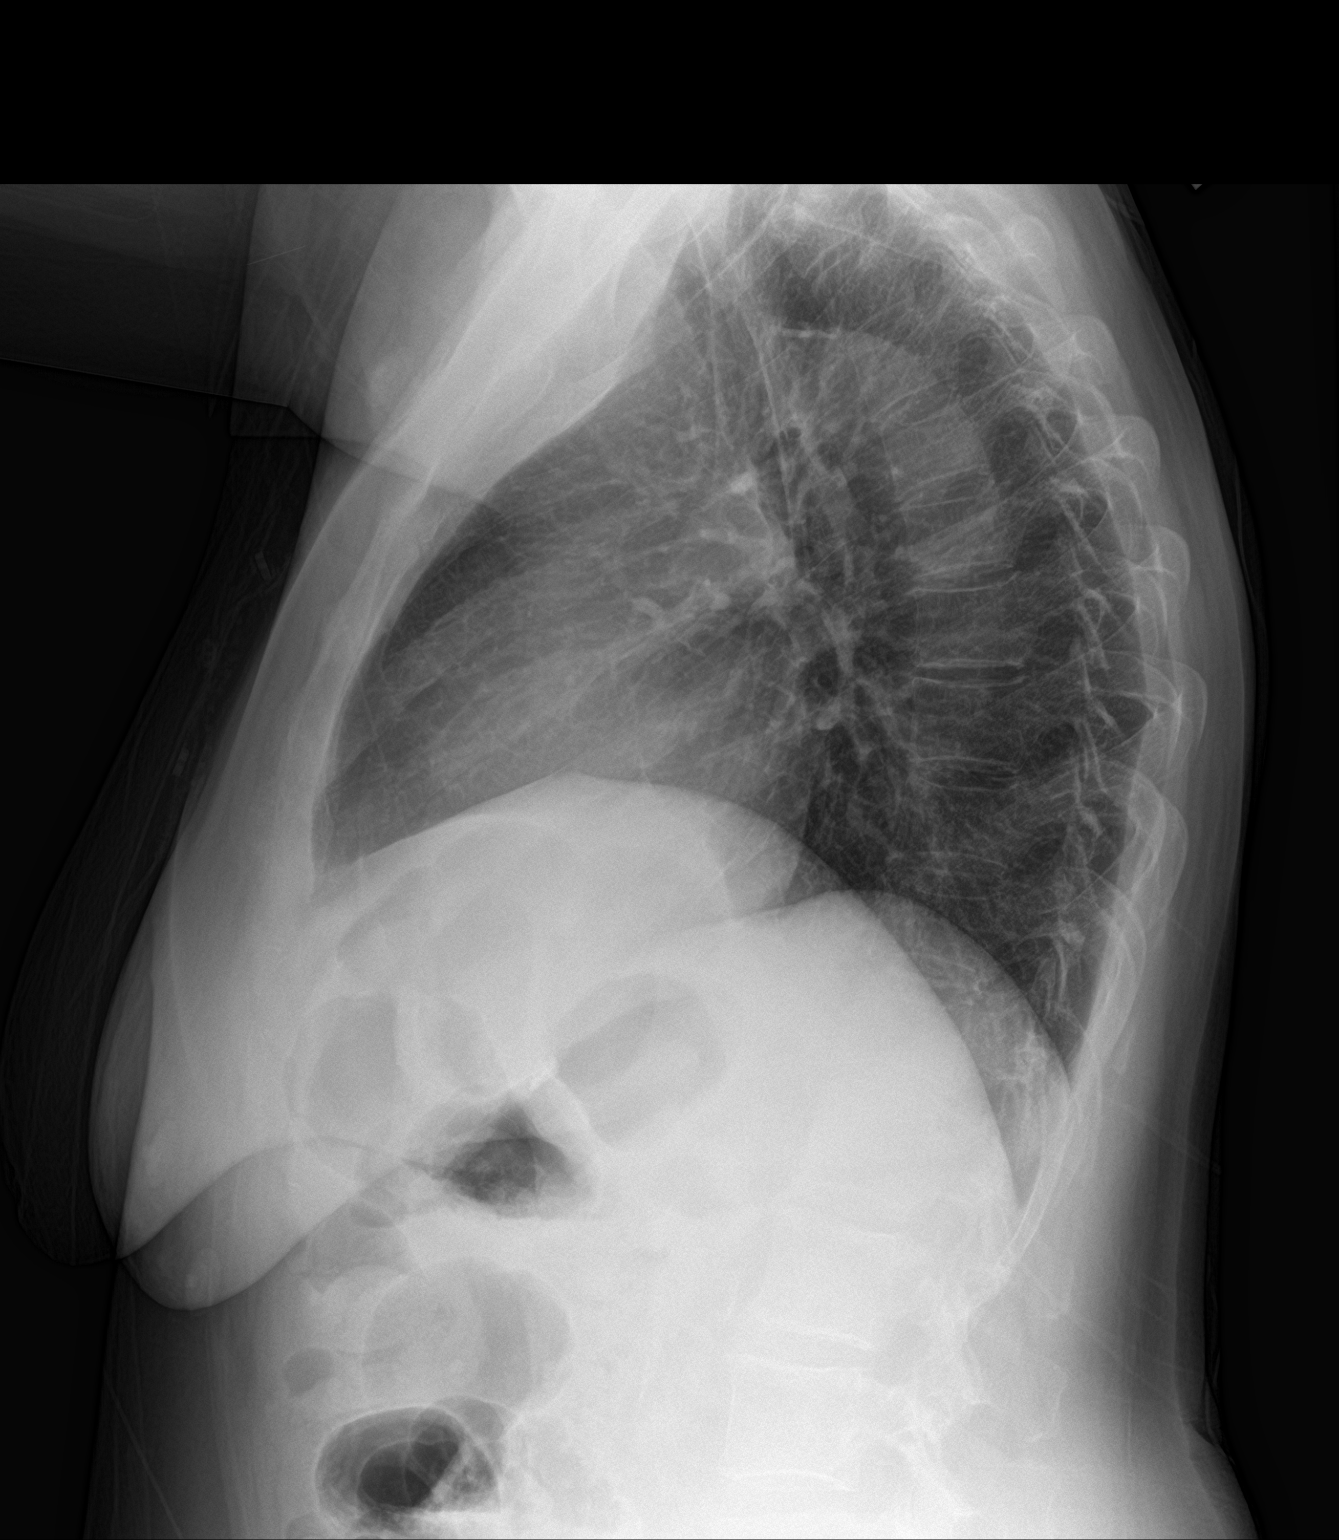

[2 of 2 positions shown; findings below may reference images not displayed]

FINDINGS: The heart size and mediastinal contours are within normal limits.
Both lungs are clear. The visualized skeletal structures are
unremarkable.
IMPRESSION: No active cardiopulmonary disease.
